# Patient Record
Sex: Female | Born: 1955
Health system: Southern US, Community
[De-identification: ages and names within clinical notes are randomized; demographics above are authoritative.]

## PROBLEM LIST (undated history)

## (undated) DIAGNOSIS — F329 Major depressive disorder, single episode, unspecified: Secondary | ICD-10-CM

## (undated) DIAGNOSIS — G2581 Restless legs syndrome: Secondary | ICD-10-CM

## (undated) DIAGNOSIS — I10 Essential (primary) hypertension: Secondary | ICD-10-CM

## (undated) DIAGNOSIS — I2699 Other pulmonary embolism without acute cor pulmonale: Secondary | ICD-10-CM

## (undated) DIAGNOSIS — I82409 Acute embolism and thrombosis of unspecified deep veins of unspecified lower extremity: Secondary | ICD-10-CM

## (undated) DIAGNOSIS — R42 Dizziness and giddiness: Secondary | ICD-10-CM

## (undated) DIAGNOSIS — I739 Peripheral vascular disease, unspecified: Secondary | ICD-10-CM

## (undated) DIAGNOSIS — D649 Anemia, unspecified: Secondary | ICD-10-CM

## (undated) DIAGNOSIS — G4730 Sleep apnea, unspecified: Secondary | ICD-10-CM

## (undated) DIAGNOSIS — F32A Depression, unspecified: Secondary | ICD-10-CM

## (undated) DIAGNOSIS — T8859XA Other complications of anesthesia, initial encounter: Secondary | ICD-10-CM

## (undated) DIAGNOSIS — M79609 Pain in unspecified limb: Secondary | ICD-10-CM

## (undated) DIAGNOSIS — C189 Malignant neoplasm of colon, unspecified: Secondary | ICD-10-CM

## (undated) DIAGNOSIS — G039 Meningitis, unspecified: Secondary | ICD-10-CM

## (undated) DIAGNOSIS — Z992 Dependence on renal dialysis: Secondary | ICD-10-CM

## (undated) DIAGNOSIS — J449 Chronic obstructive pulmonary disease, unspecified: Secondary | ICD-10-CM

## (undated) DIAGNOSIS — Z9289 Personal history of other medical treatment: Secondary | ICD-10-CM

## (undated) DIAGNOSIS — F419 Anxiety disorder, unspecified: Secondary | ICD-10-CM

## (undated) DIAGNOSIS — N186 End stage renal disease: Secondary | ICD-10-CM

## (undated) DIAGNOSIS — R0602 Shortness of breath: Secondary | ICD-10-CM

## (undated) DIAGNOSIS — I509 Heart failure, unspecified: Secondary | ICD-10-CM

## (undated) DIAGNOSIS — I2510 Atherosclerotic heart disease of native coronary artery without angina pectoris: Secondary | ICD-10-CM

## (undated) DIAGNOSIS — T4145XA Adverse effect of unspecified anesthetic, initial encounter: Secondary | ICD-10-CM

## (undated) HISTORY — DX: Sleep apnea, unspecified: G47.30

## (undated) HISTORY — DX: Malignant neoplasm of colon, unspecified: C18.9

## (undated) HISTORY — DX: Depression, unspecified: F32.A

## (undated) HISTORY — DX: Anxiety disorder, unspecified: F41.9

## (undated) HISTORY — DX: Atherosclerotic heart disease of native coronary artery without angina pectoris: I25.10

## (undated) HISTORY — DX: Dizziness and giddiness: R42

## (undated) HISTORY — DX: Essential (primary) hypertension: I10

## (undated) HISTORY — DX: Chronic obstructive pulmonary disease, unspecified: J44.9

## (undated) HISTORY — PX: ABDOMINAL HYSTERECTOMY: SHX81

## (undated) HISTORY — PX: BREAST BIOPSY: SHX20

## (undated) HISTORY — DX: Meningitis, unspecified: G03.9

## (undated) HISTORY — DX: Major depressive disorder, single episode, unspecified: F32.9

## (undated) HISTORY — DX: Anemia, unspecified: D64.9

## (undated) HISTORY — PX: CHOLECYSTECTOMY: SHX55

---
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
## 2008-08-23 ENCOUNTER — Encounter: Admission: RE | Admit: 2008-08-23 | Discharge: 2008-08-23 | Payer: Self-pay | Admitting: Family Medicine

## 2008-08-23 IMAGING — CR DG KNEE 1-2V*R*
2 series · 2 of 2 positions shown · non-contrast
Comparison: Left knee radiographs done the same date.

CLINICAL DATA: Bilateral knee pain, left greater than right, for 5
months.  No acute injury.

RIGHT KNEE - 1-2 VIEW

[view not recorded (1 of 2)]
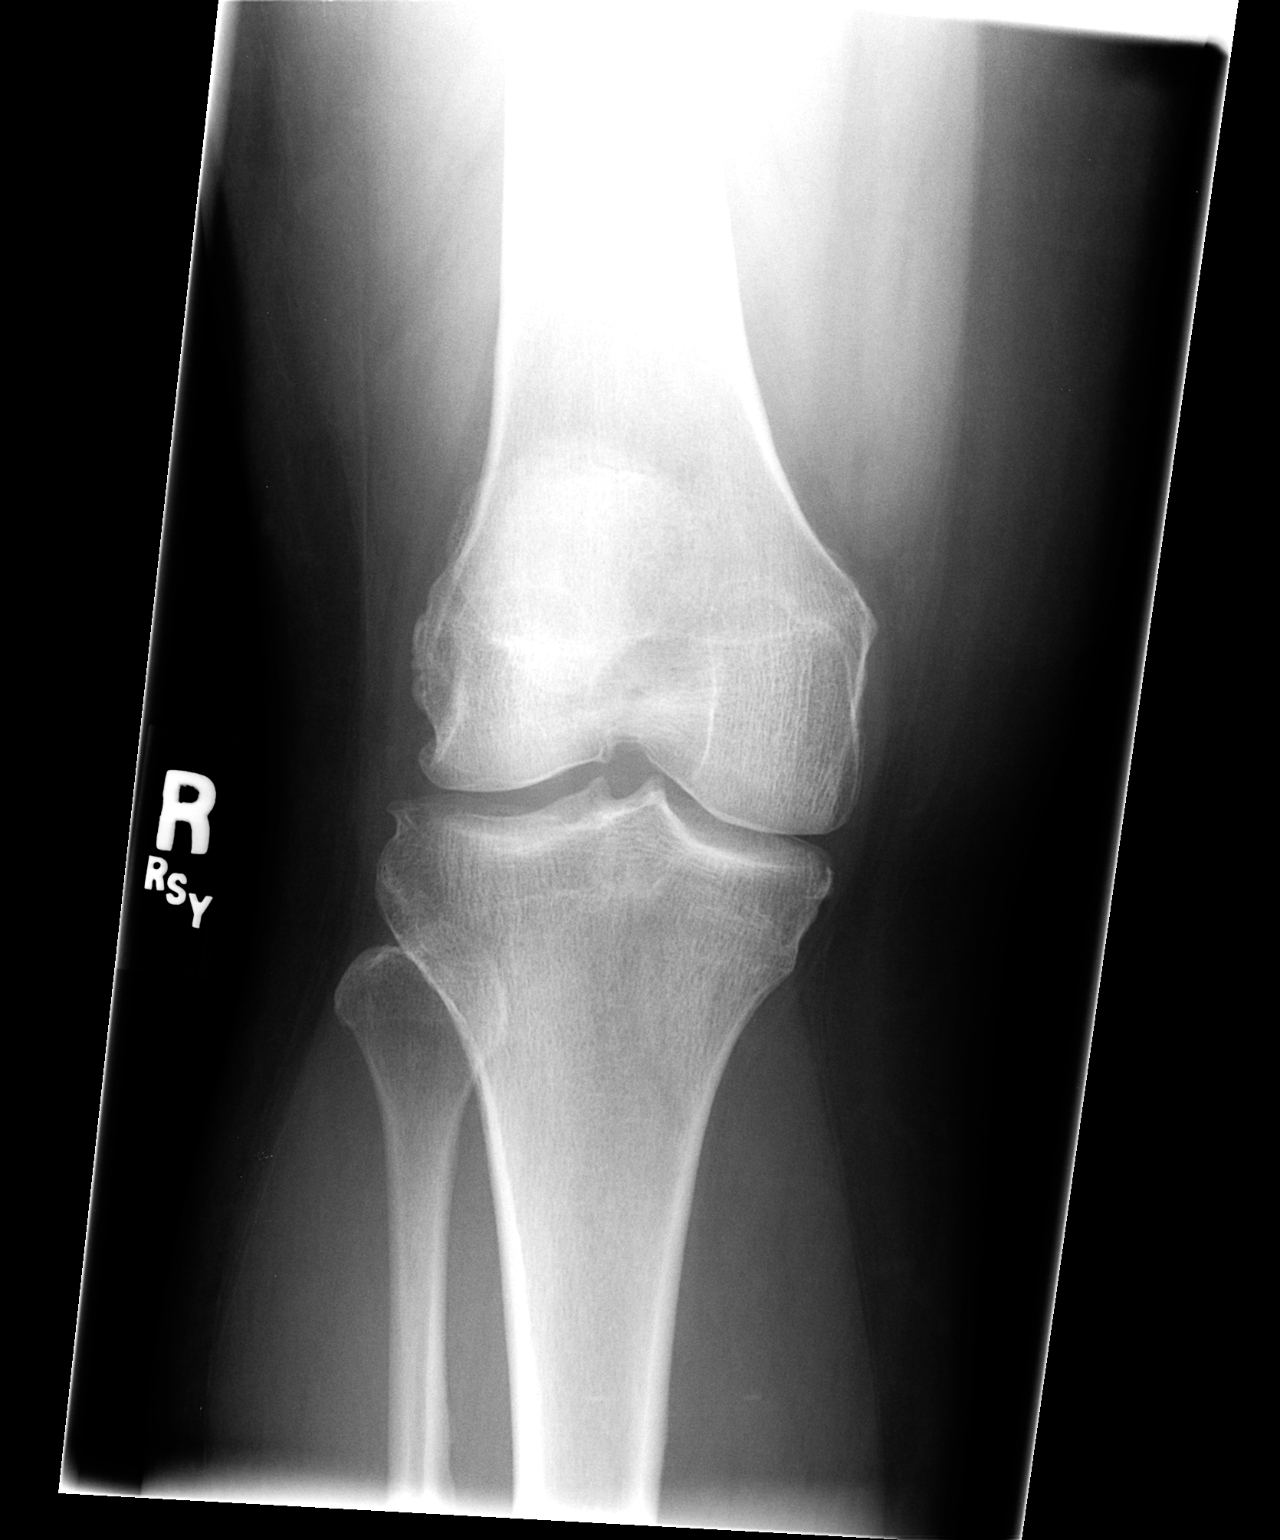

[view not recorded (2 of 2)]
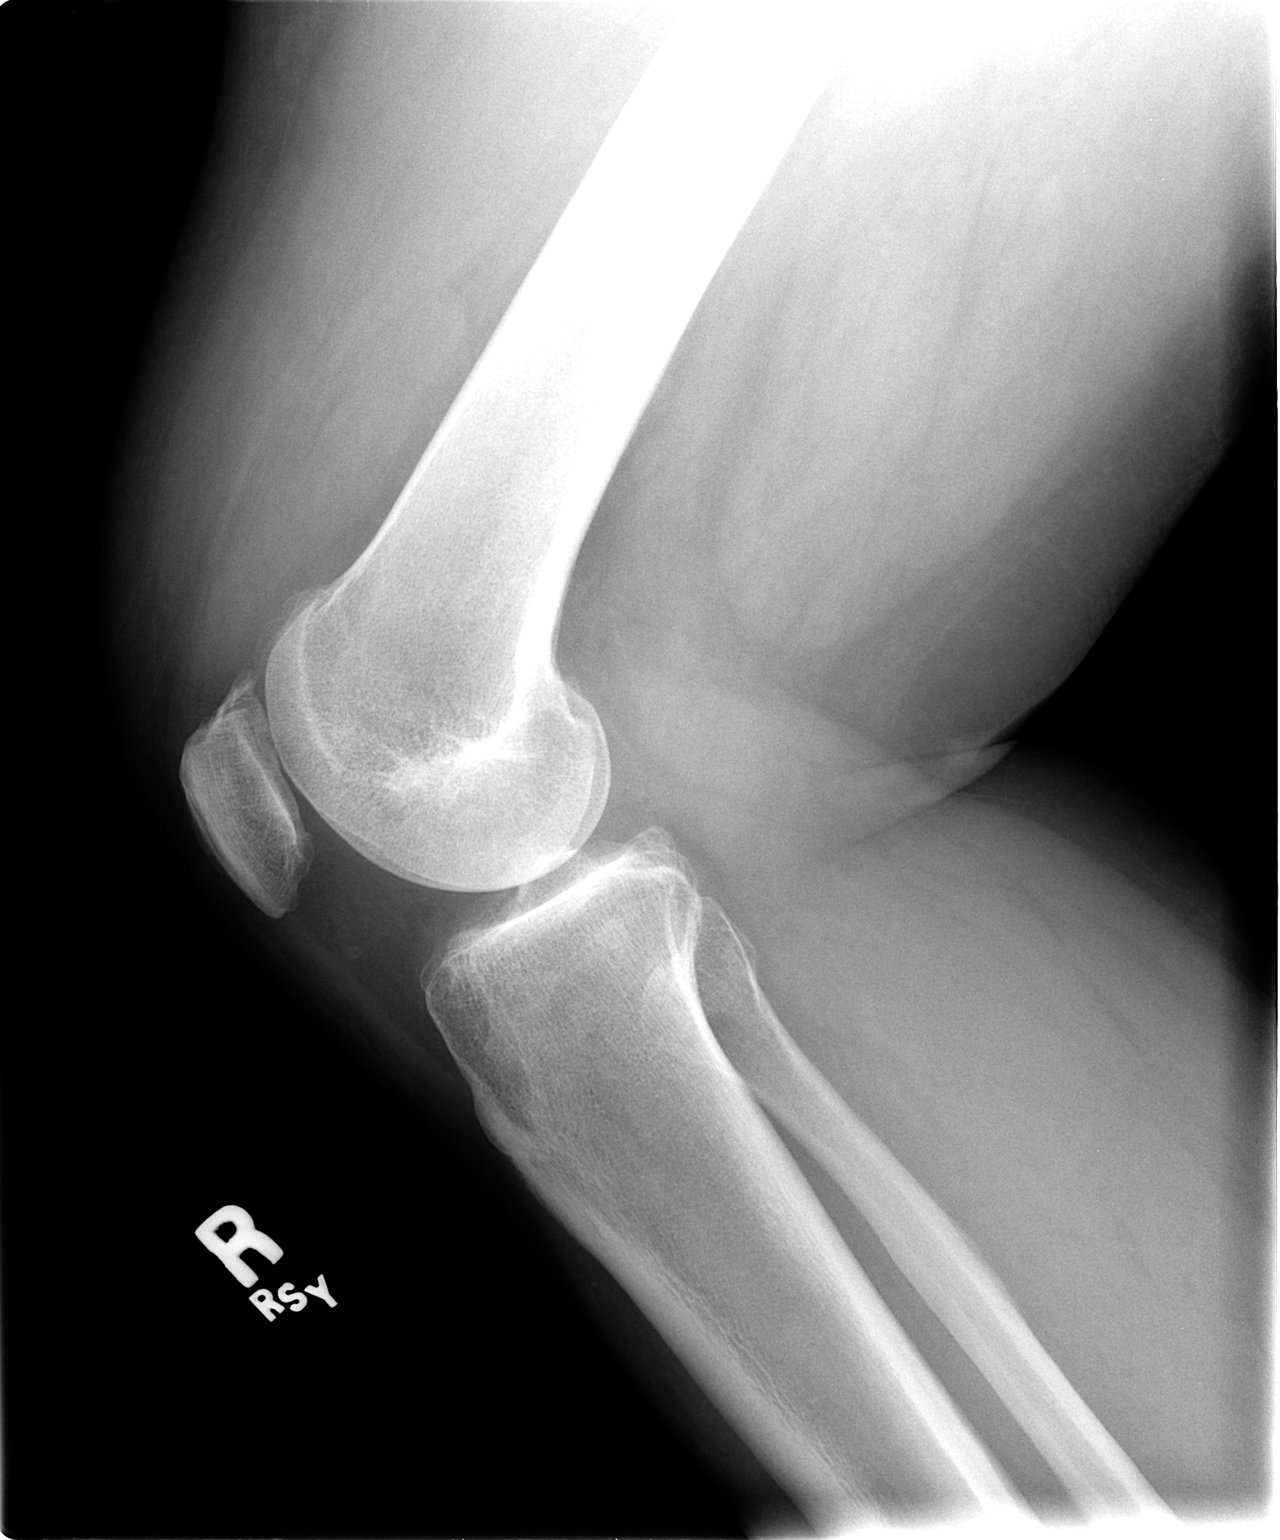

[2 of 2 positions shown; findings below may reference images not displayed]

FINDINGS: Mild tricompartmental degenerative changes are present on
the right.  The alignment and mineralization are normal.  There is
no evidence of acute fracture.  A small knee joint effusion is
present.
IMPRESSION: 1.  Small knee joint effusion.
2.  No acute osseous findings.
3.  Mild tricompartmental degenerative changes.

## 2008-08-23 IMAGING — CR DG KNEE 1-2V*L*
2 series · 2 of 2 positions shown · non-contrast
Comparison: Right knee radiographs done the same date.

CLINICAL DATA: Bilateral knee pain, left greater than right.  No
known injury.

LEFT KNEE - 1-2 VIEW

[view not recorded (1 of 2)]
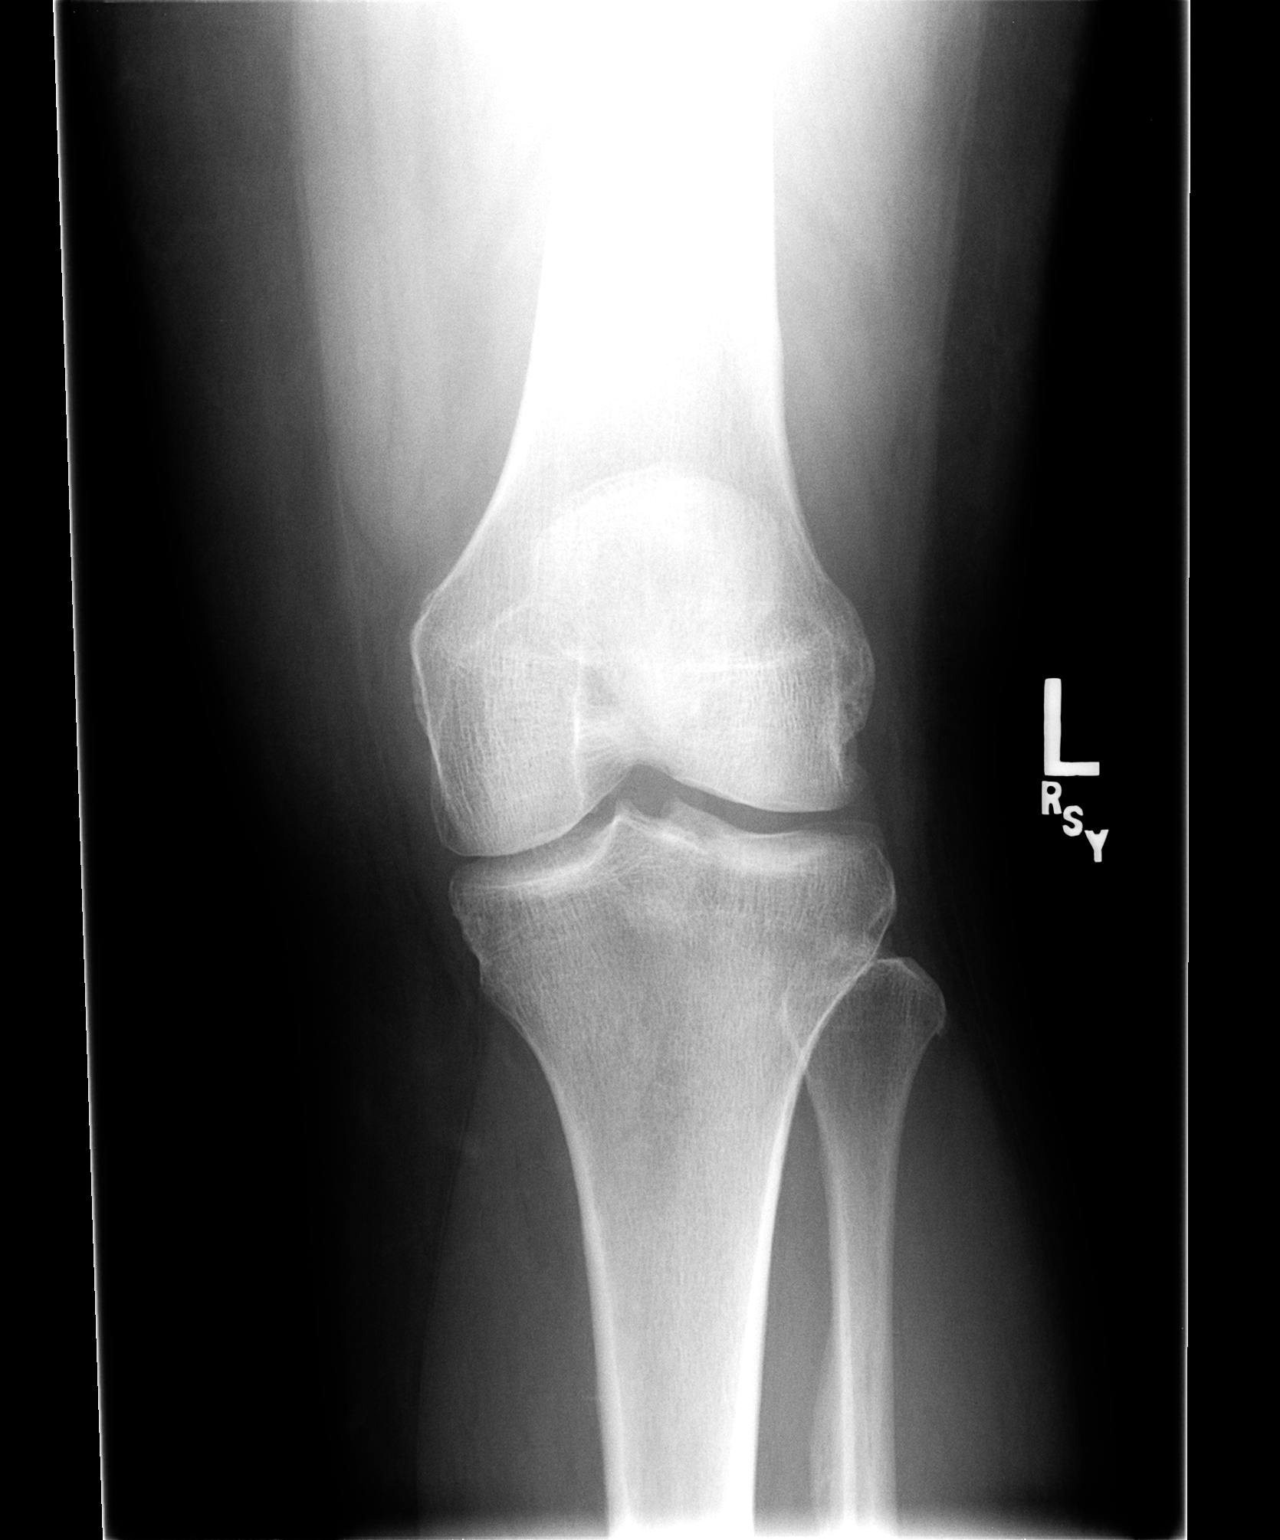

[view not recorded (2 of 2)]
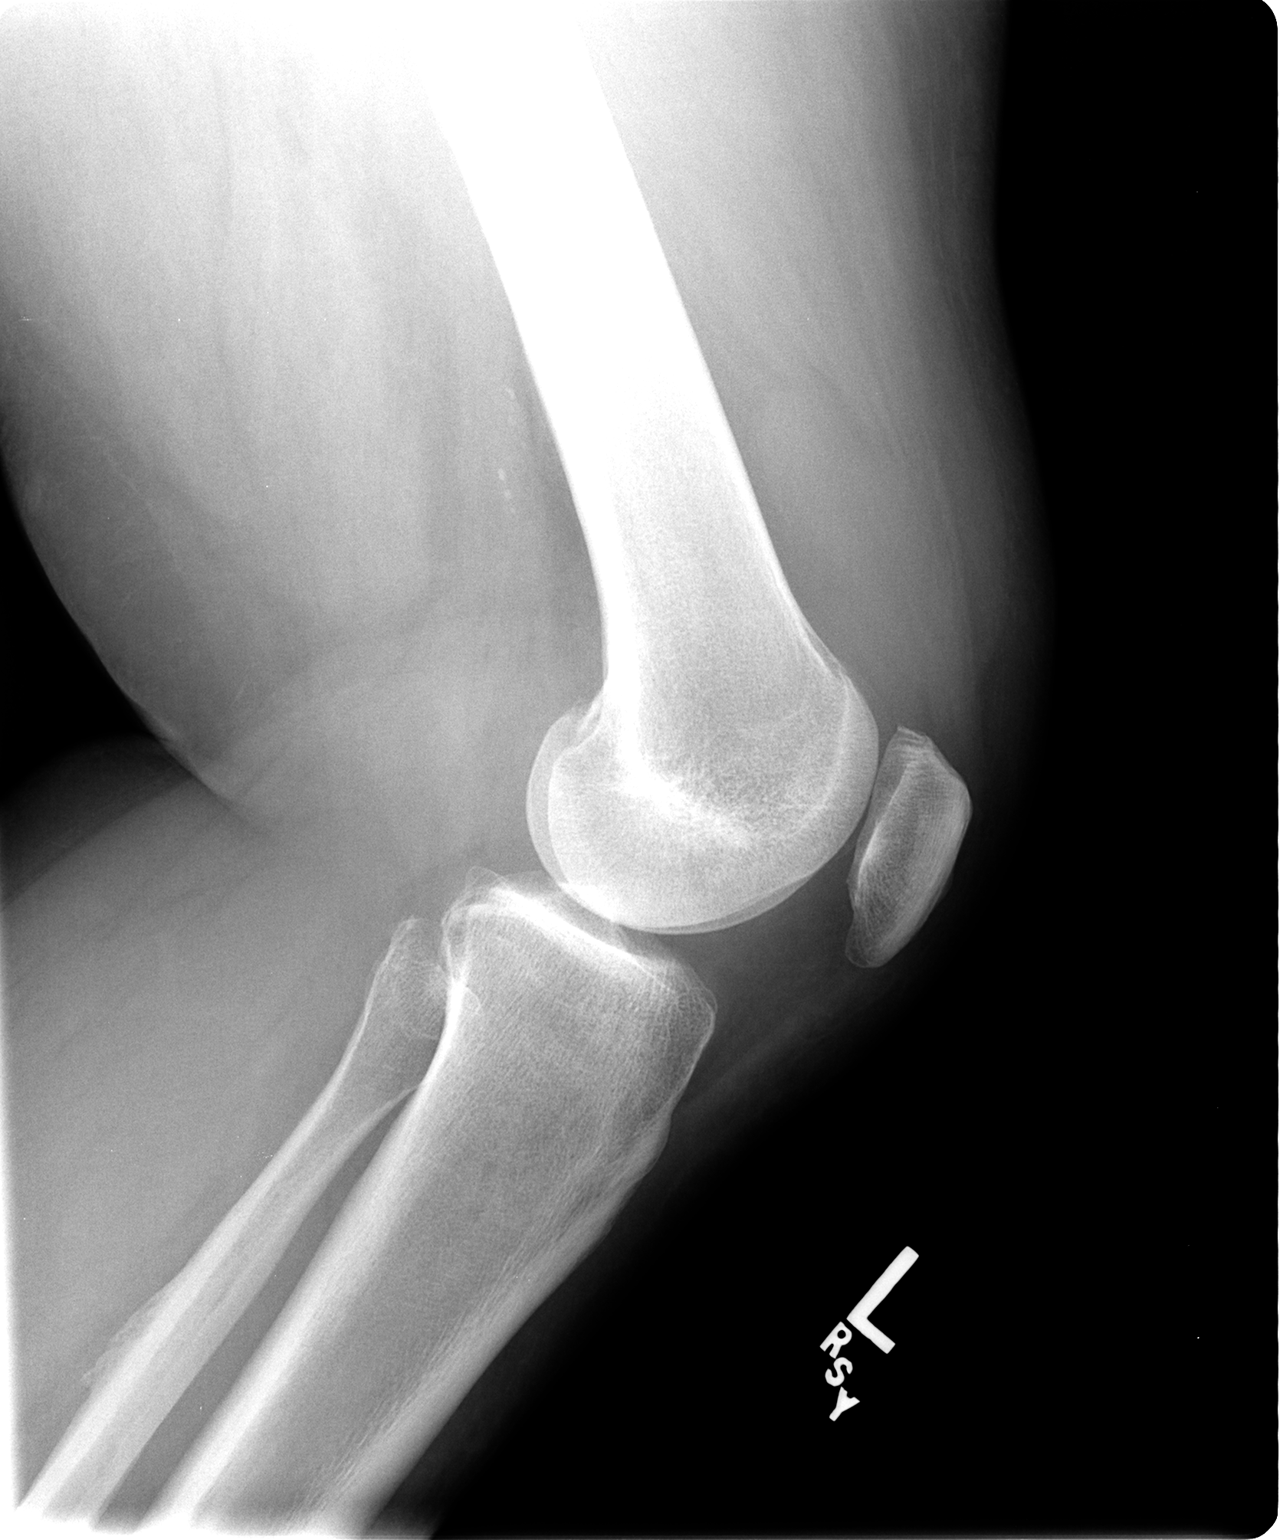

[2 of 2 positions shown; findings below may reference images not displayed]

FINDINGS: Mineralization and alignment appear normal.  There is
minimal medial joint space loss.  No acute fracture or dislocation
is seen.  There is evidence of a moderate sized knee joint
effusion.
IMPRESSION: 1.  Moderate sized knee joint effusion.
2.  No acute osseous findings.  Minimal medial joint space loss.

## 2008-08-31 ENCOUNTER — Encounter: Admission: RE | Admit: 2008-08-31 | Discharge: 2008-08-31 | Payer: Self-pay | Admitting: Family Medicine

## 2008-09-01 ENCOUNTER — Encounter: Admission: RE | Admit: 2008-09-01 | Discharge: 2008-09-01 | Payer: Self-pay | Admitting: Family Medicine

## 2008-09-01 IMAGING — CT CT ABDOMEN W/ CM
2 of 5 series · 16 of 46 positions shown, 18 images · IV contrast (READICAT/WATER & [ID] OMNI 300)
Comparison: None.

CT ABDOMEN

CLINICAL DATA: Left sided abdominal pain.  Previous contrast
allergy for hives with premedication.  Post right partial colectomy
for colon cancer.  Weight loss.  Nausea and diarrhea.
Cholecystectomy and hysterectomy.

CT ABDOMEN AND PELVIS WITH CONTRAST
TECHNIQUE: Multidetector CT imaging of the abdomen and pelvis was
performed using the standard protocol following bolus
administration of intravenous contrast.
Contrast: 125 ml [8O].

[Series 2: a&p w/ · axial · 0.78mm/px · z∈[-416,-41]mm · 13 of 85 slices shown, 15 images]
[im 5/85  soft-tissue]
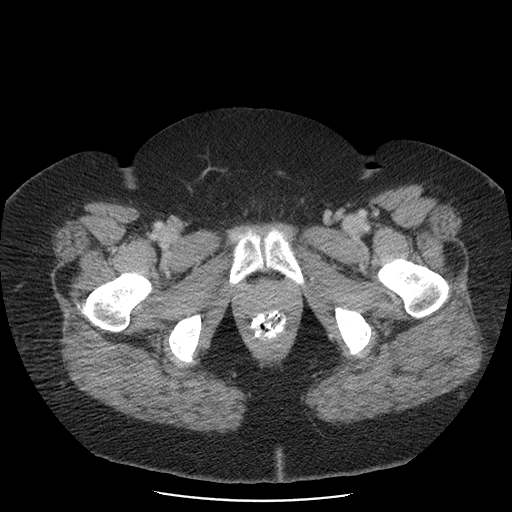
[im 5/85  bone]
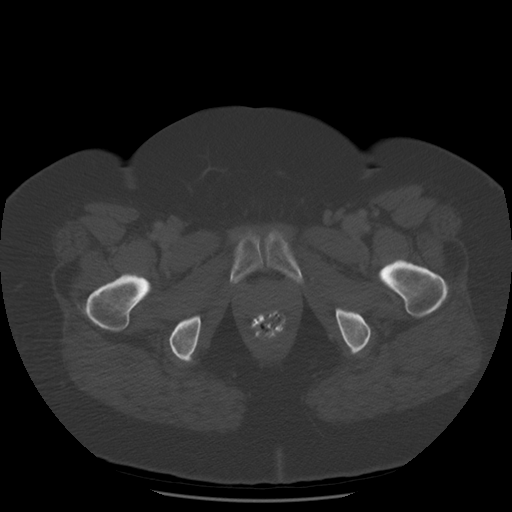
[im 14/85  soft-tissue]
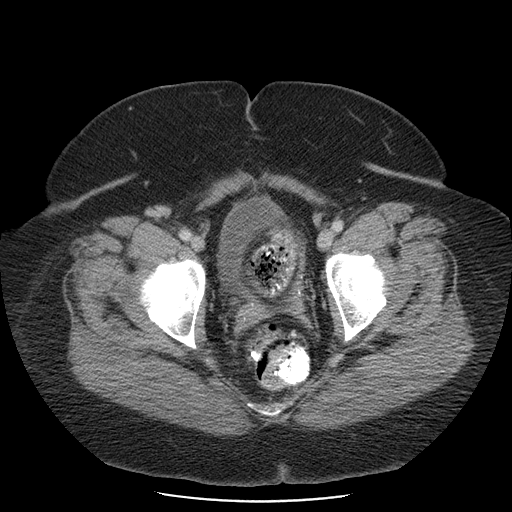
[im 18/85  soft-tissue]
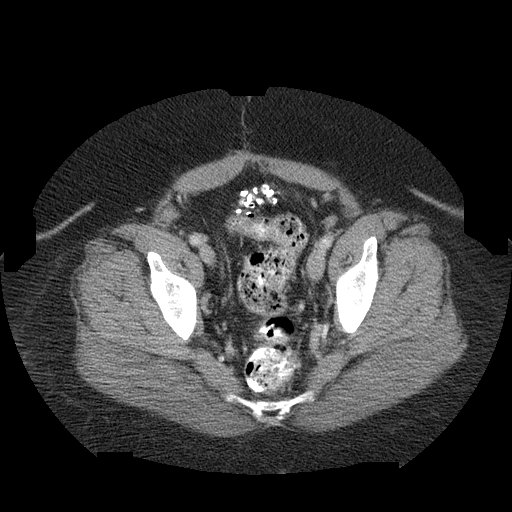
[im 23/85  soft-tissue]
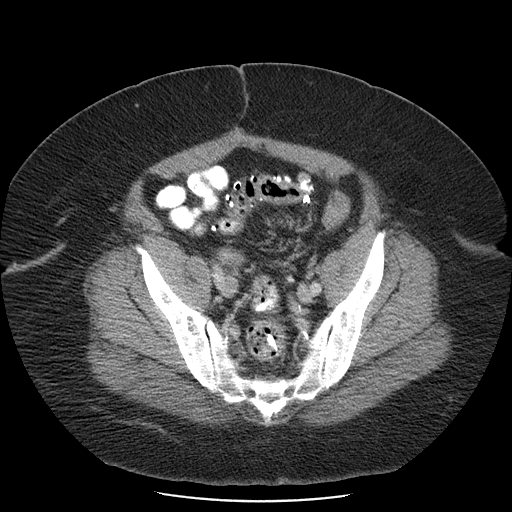
[im 31/85  soft-tissue]
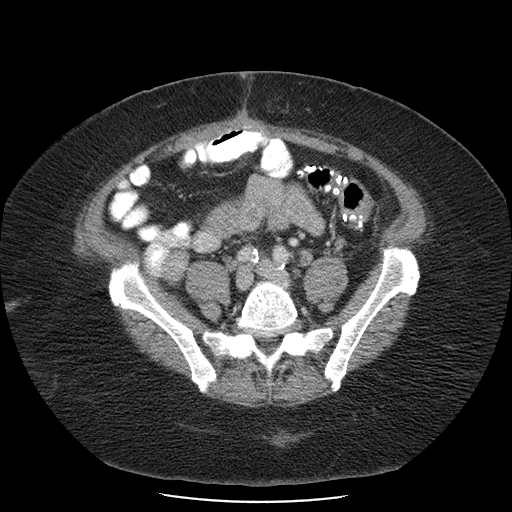
[im 36/85  soft-tissue]
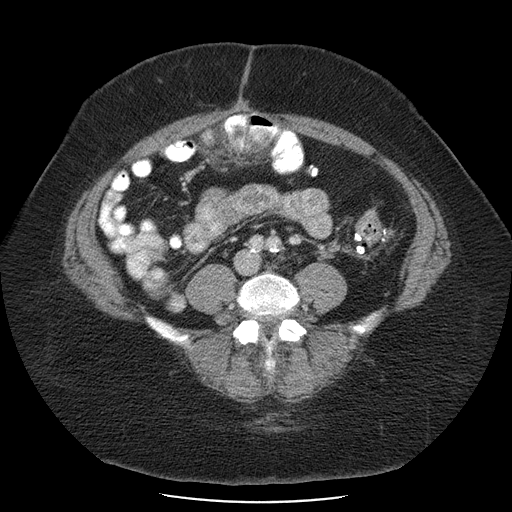
[im 45/85  soft-tissue]
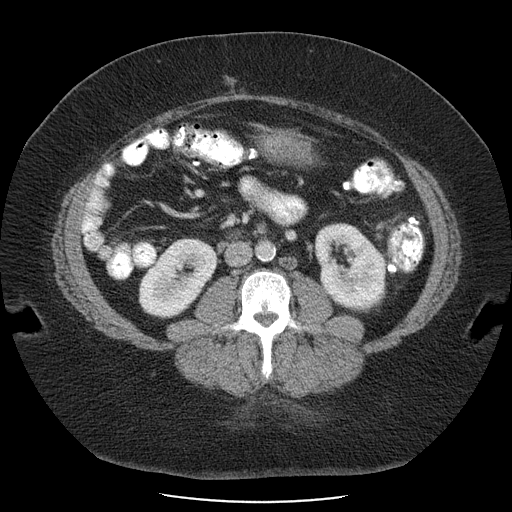
[im 49/85  soft-tissue]
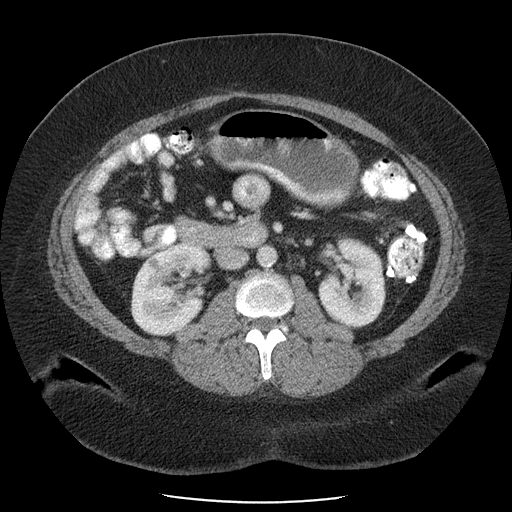
[im 54/85  soft-tissue]
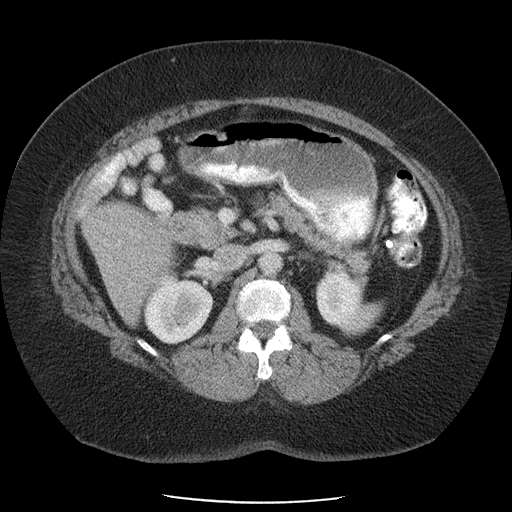
[im 54/85  bone]
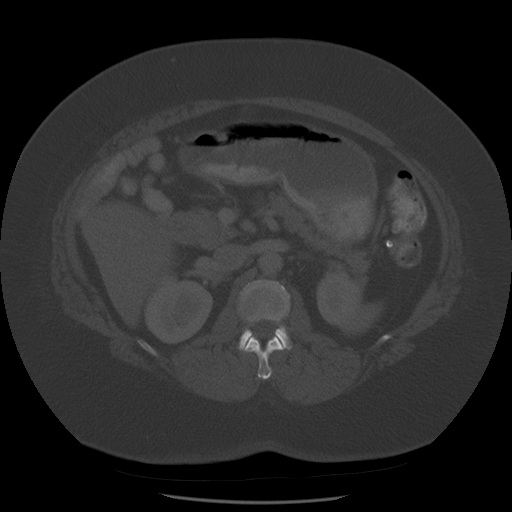
[im 62/85  soft-tissue]
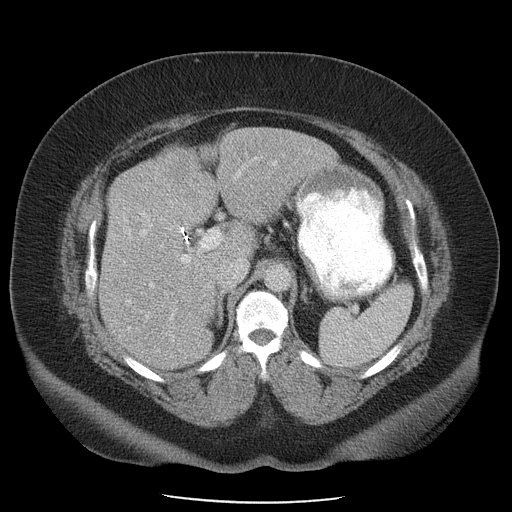
[im 67/85  soft-tissue]
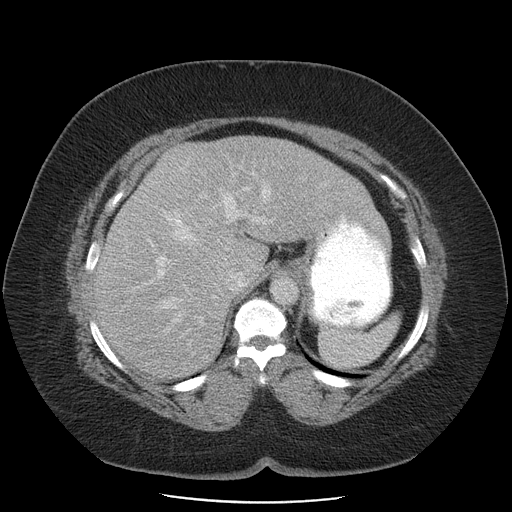
[im 71/85  soft-tissue]
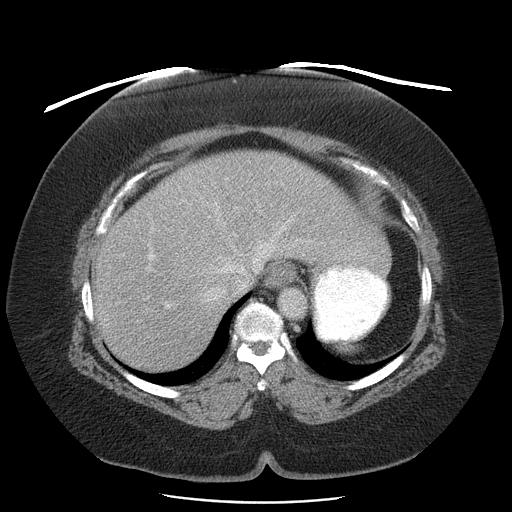
[im 80/85  soft-tissue]
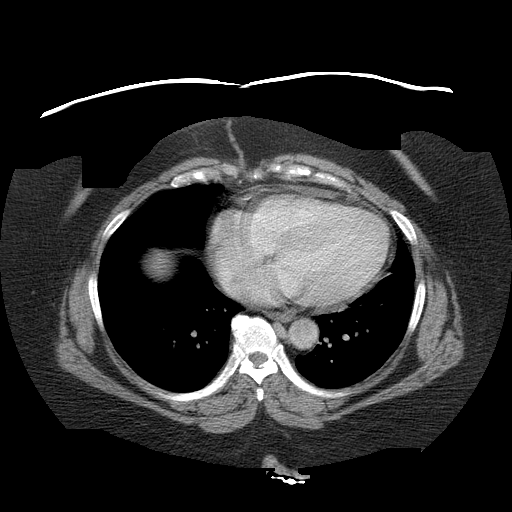

[Series 400: cor abd · coronal · 0.89mm/px · 3 of 113 slices shown]
[im 38/113  soft-tissue]
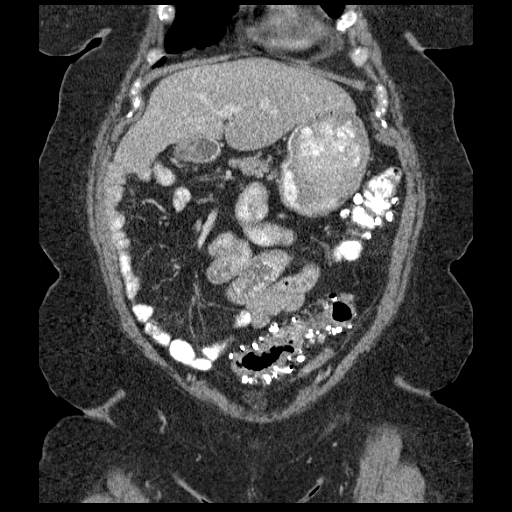
[im 50/113  soft-tissue]
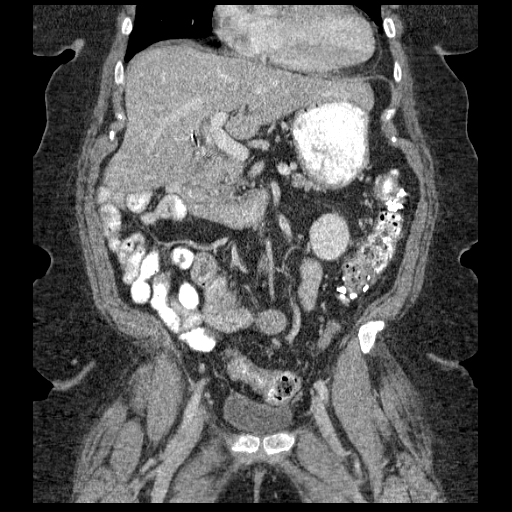
[im 63/113  soft-tissue]
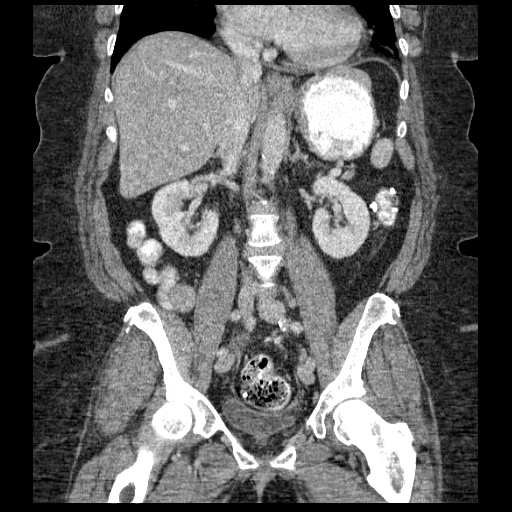

[16 of 46 positions shown; findings below may reference images not displayed]

FINDINGS: Right hemicolectomy and extensive transverse and
descending diverticulosis noted. Slight mesenteric infiltration
along the descending colon is consistent with mild inflammatory
change such as mild diverticulitis.  No free intraperitoneal air or
free fluid noted.  The patient is post cholecystectomy with no
dilated biliary tract.  Liver size is upper limits of normal with
slight diffuse fatty infiltration and no focal lesions to suggest
metastatic disease.  Slight atheromatous vascular calcification
with normal caliber abdominal aorta is seen.  Lung bases are clear
with borderline to slight cardiomegaly.   Jejunal loops measure
upper limits of normal with no evidence for intestinal obstruction.
Small subcentimeter (to mid to lower pole right and solitary lower
pole left) renal cysts incidentally noted. Remaining abdominal
organs appear normal.
IMPRESSION: 1.  Post right hemicolectomy with no evidence for locally recurrent
or metastatic colon cancer.
2.  Extensive transverse and descending diverticulosis with mild
mesenteric edema of probable mild acute diverticulitis the
descending colon.  No associated free intraperitoneal air, free
fluid, or abscess seen.
3.  Post cholecystectomy.
4.  Slight diffuse fatty infiltration liver.
5.  Slight atheromatous vascular calcification with normal caliber
abdominal aorta.
6.  Otherwise no significant abnormality.

CT PELVIS
FINDINGS: The patient is post hysterectomy by history and CT exam.
Extensive sigmoid diverticulosis without inflammation findings
noted.  Remaining intestine is unremarkable.  No free fluid or
significant adenopathy noted.  Moderate atheromatous vascular
calcification visualized. At the superior left pelvis is discrete
solid ovoid structure measuring 4.8 cm long X 1.8 cm AP X 3.5 cm
wide favoring slightly enlarged left ovary.  No discernible right
ovary identified.
IMPRESSION: 1.  Extensive sigmoid diverticulosis without inflammation.
2.  Post hysterectomy.
3.  Moderate atheromatous vascular calcification.
4.  Probable left ovary appears enlarged - need clinical
correlation for menopausal status and possible oophorectomy
history.  Although the patient's body habitus may limit pelvic
ultrasound, recommend pelvic ultrasound confirmation or
alternatively, follow-up abdominal pelvic CT  (3-6 months) may be
confirmatory as clinically indicated.

## 2008-12-30 HISTORY — PX: COLECTOMY: SHX59

## 2009-09-07 ENCOUNTER — Inpatient Hospital Stay (HOSPITAL_COMMUNITY): Admission: EM | Admit: 2009-09-07 | Discharge: 2009-09-09 | Payer: Self-pay | Admitting: Emergency Medicine

## 2009-09-07 IMAGING — US US ABDOMEN COMPLETE
1 series · 14 of 25 positions shown · non-contrast
Comparison: None

CLINICAL DATA: Elevated liver function tests.  Abdominal pain.

ABDOMINAL ULTRASOUND COMPLETE

[Series 1: us abdomen complete · 0.28mm/px · 14 of 72 slices shown]
[im 1/72]
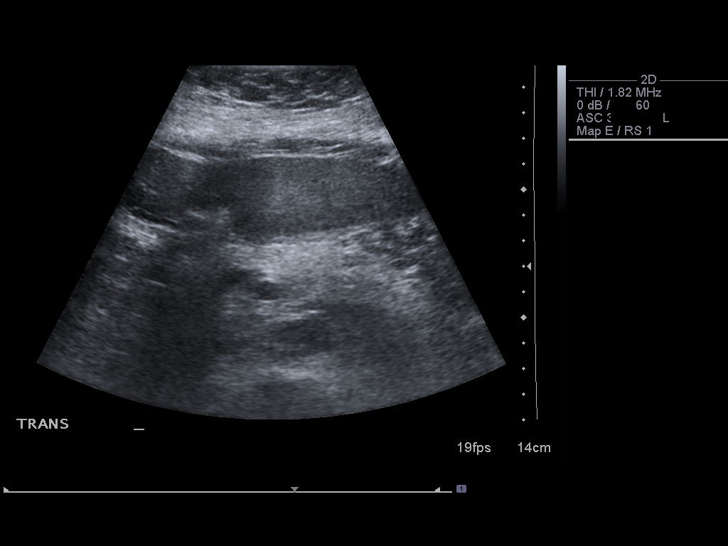
[im 6/72]
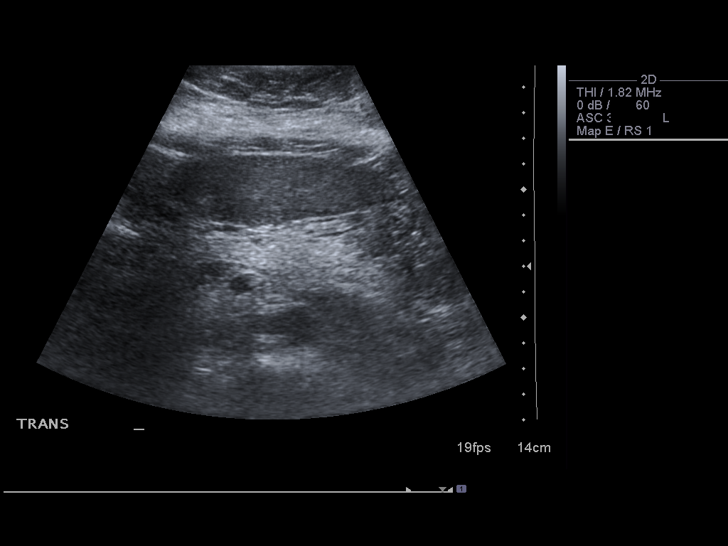
[im 12/72]
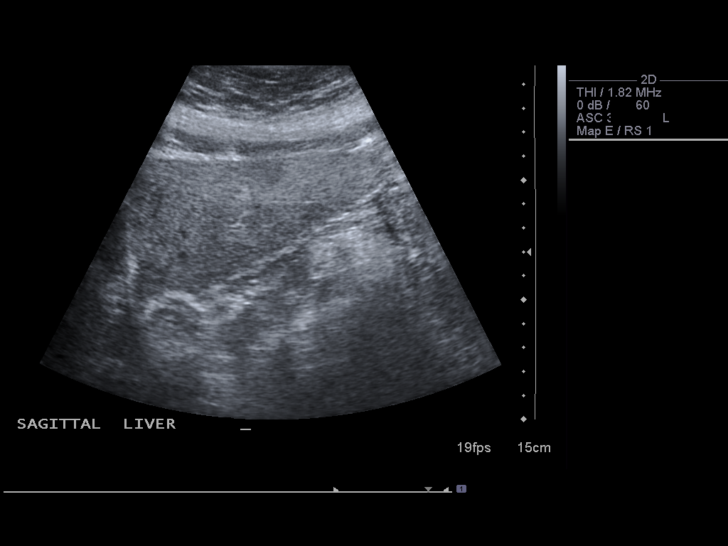
[im 18/72]
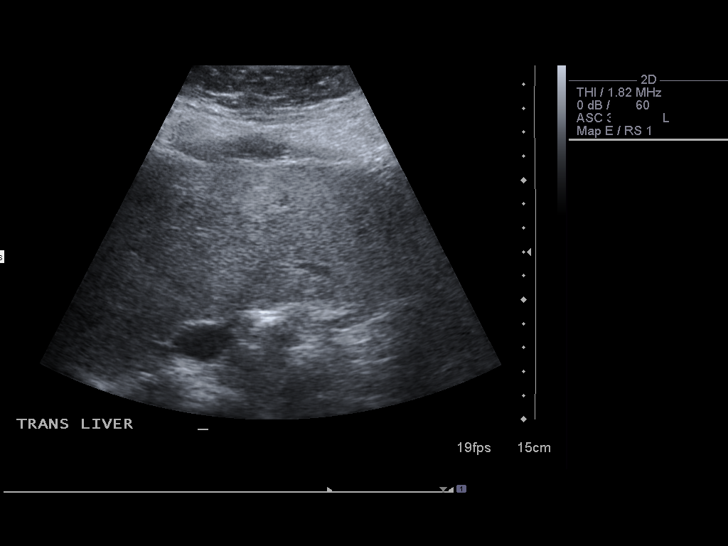
[im 24/72]
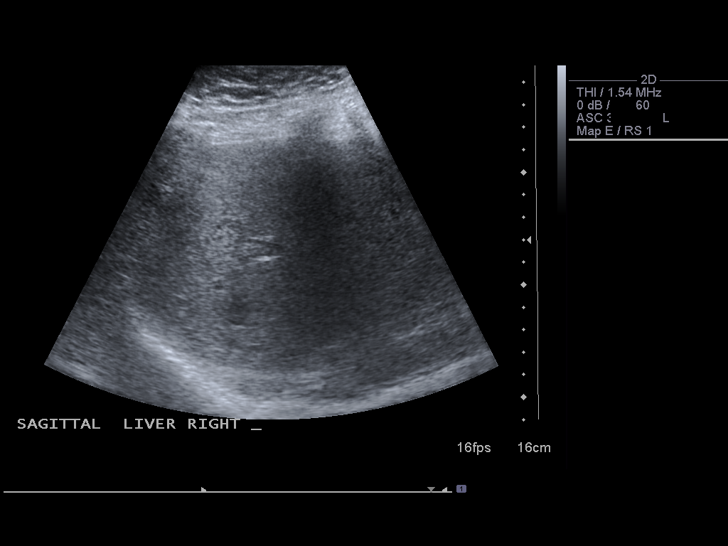
[im 27/72]
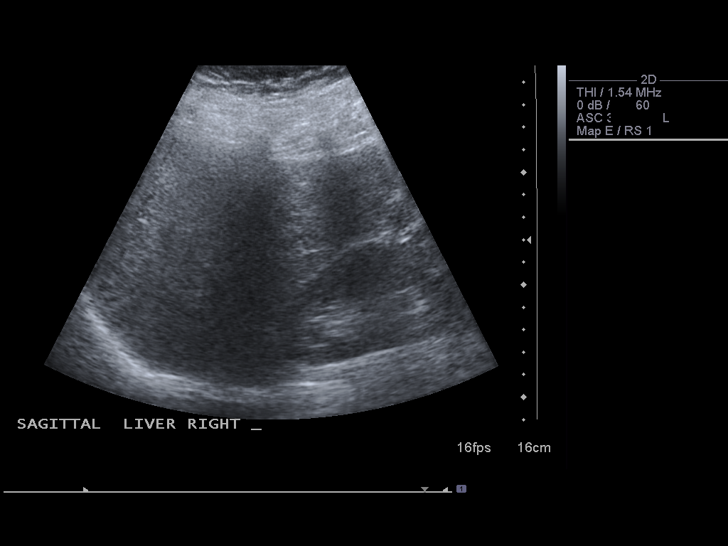
[im 33/72]
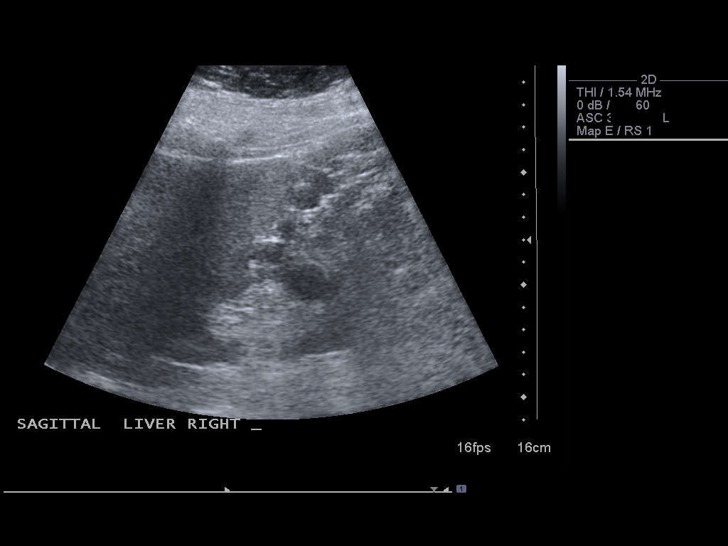
[im 39/72]
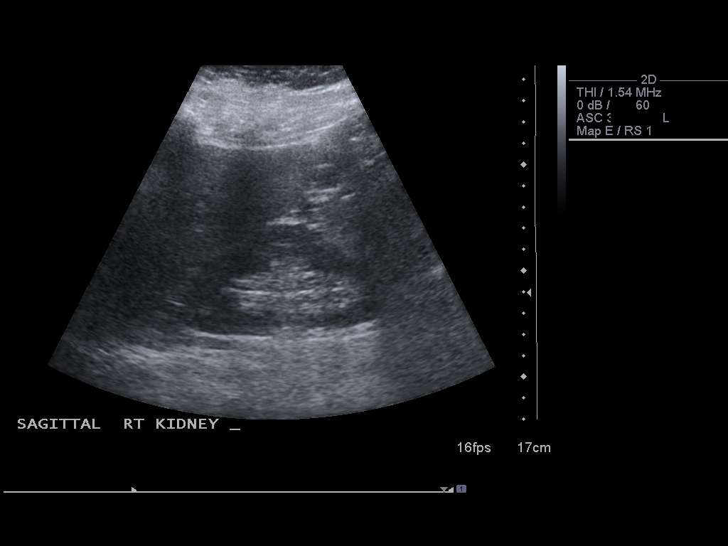
[im 45/72]
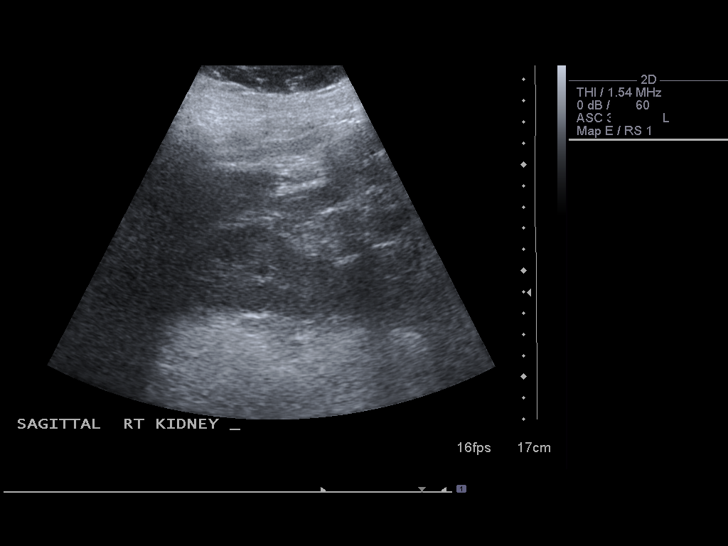
[im 48/72]
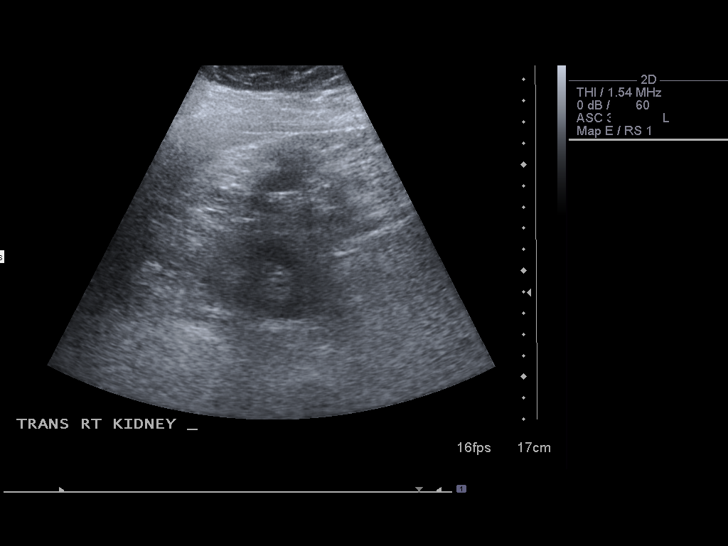
[im 54/72]
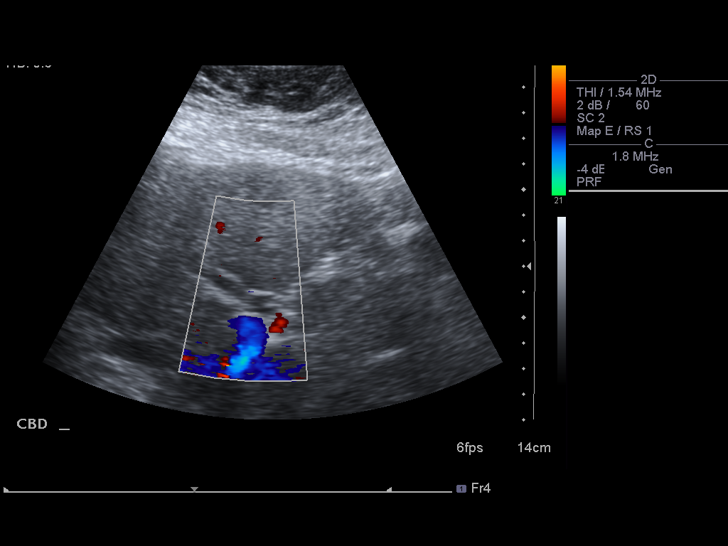
[im 60/72]
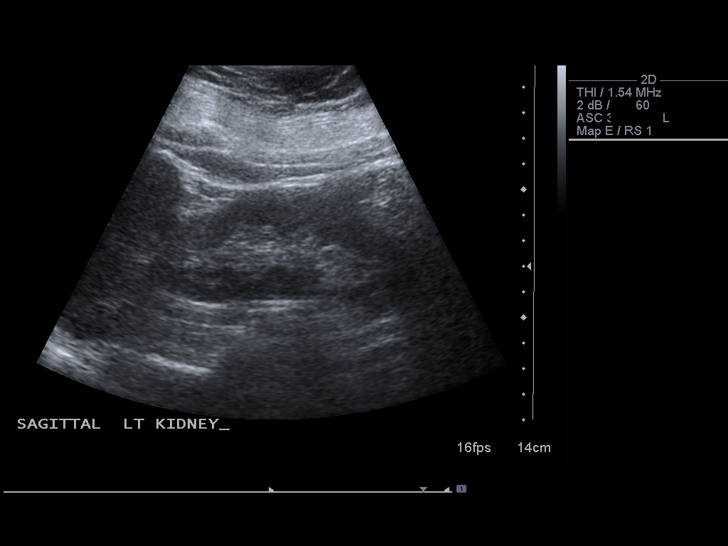
[im 66/72]
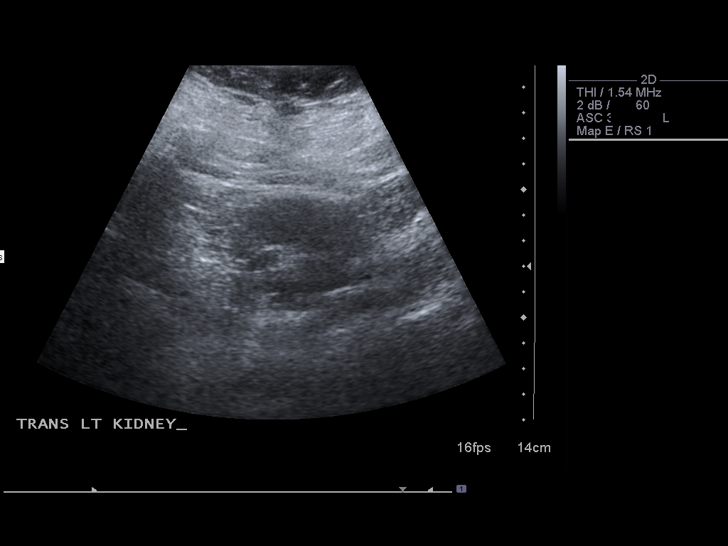
[im 72/72]
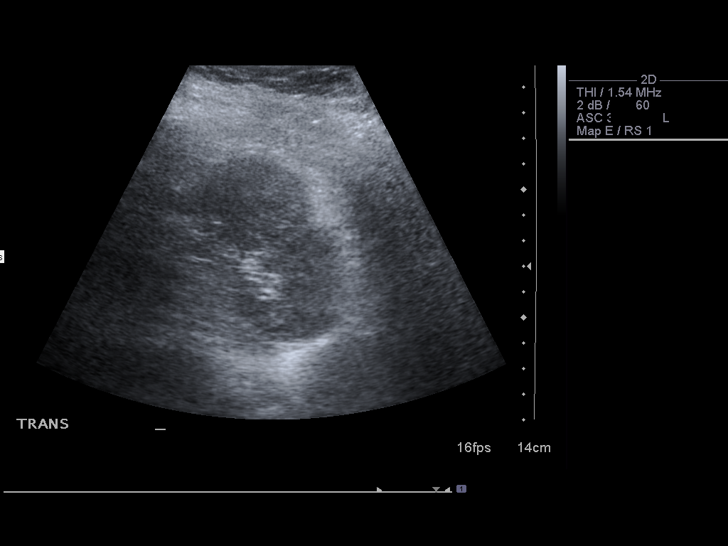

[14 of 25 positions shown; findings below may reference images not displayed]

FINDINGS: Gallbladder:  Surgically absent.

Common Bile Duct:  Within normal limits in caliber.  Measures 6-7
mm in diameter.

Liver:  No focal lesion identified.  Diffusely increased
parenchymal echogenicity, consistent with diffuse fatty
infiltration.

IVC:  Appears normal.

Pancreas:  Although the pancreas is difficult to visualize in its
entirety, no focal pancreatic abnormality is identified.

Spleen:  Within normal limits in size and echotexture.

Right kidney:  Normal in size and parenchymal echogenicity.  No
evidence of mass or hydronephrosis.

Left kidney:  Normal in size and parenchymal echogenicity.  No
evidence of mass or hydronephrosis.

Abdominal Aorta:  No aneurysm identified.
IMPRESSION: 1.  Diffuse fatty infiltration of liver.
2.  Previous cholecystectomy.  No evidence of biliary ductal
dilatation or other acute findings.

## 2009-09-07 IMAGING — CT CT ABDOMEN W/O CM
2 of 4 series · 13 of 32 positions shown, 18 images · non-contrast
Comparison: [DATE]

CT ABDOMEN

CLINICAL DATA: Left flank pain, nausea vomiting.  History of colon
cancer.

CT ABDOMEN AND PELVIS WITHOUT CONTRAST
TECHNIQUE: Multidetector CT imaging of the abdomen and pelvis was
performed following the standard protocol without intravenous
contrast.

[Series 2: abd pelvis · axial · 0.93mm/px · z∈[-380,-60]mm · 7 of 86 slices shown, 12 images]
[im 11/86  soft-tissue]
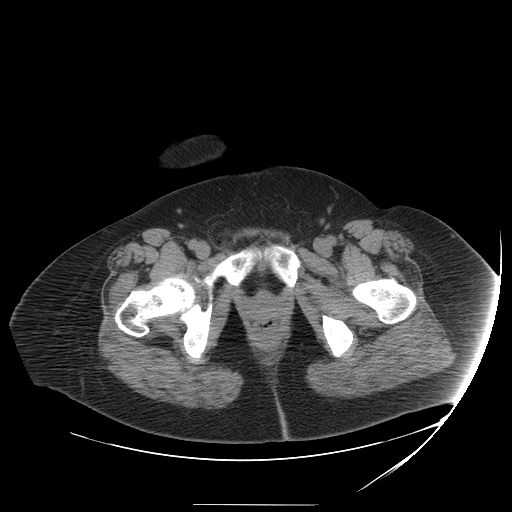
[im 11/86  bone]
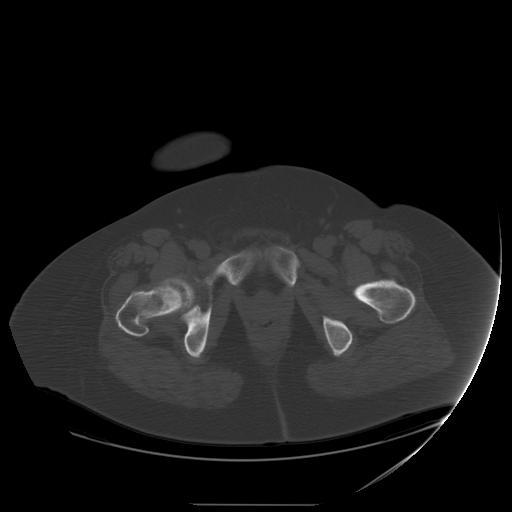
[im 22/86  soft-tissue]
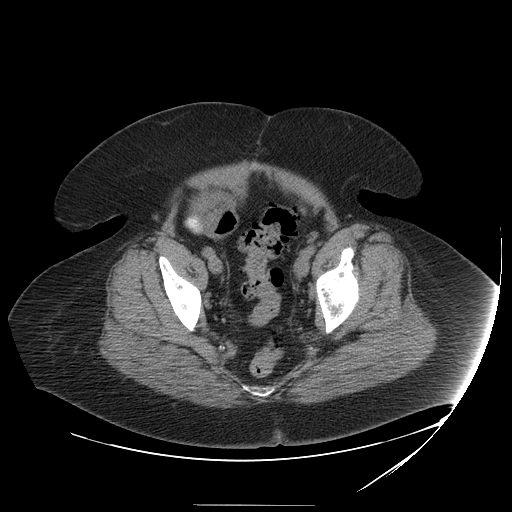
[im 32/86  soft-tissue]
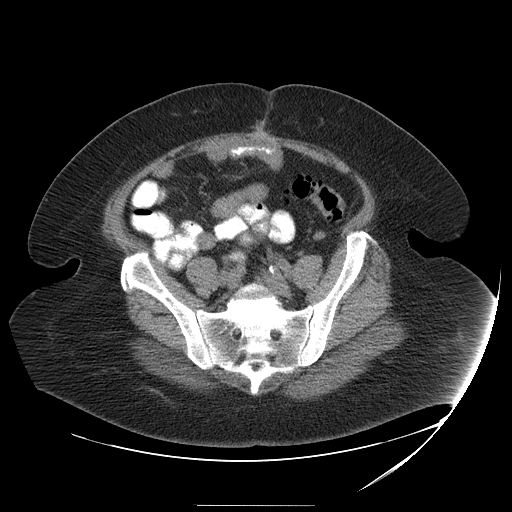
[im 43/86  soft-tissue]
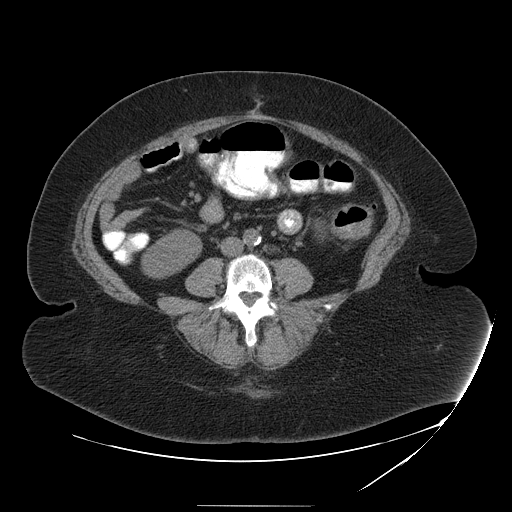
[im 43/86  lung]
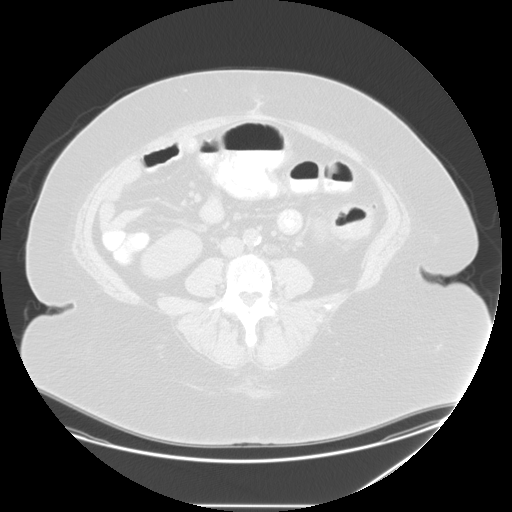
[im 54/86  soft-tissue]
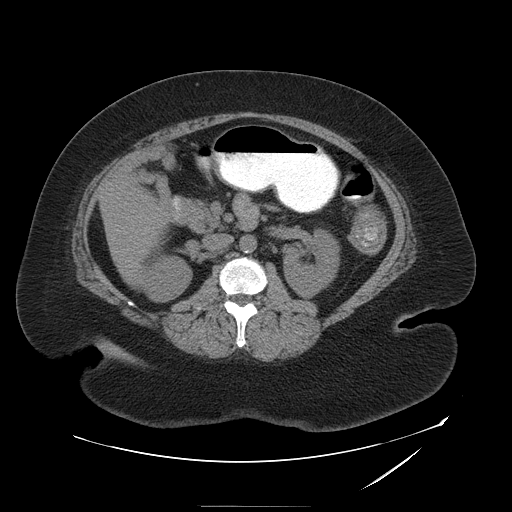
[im 54/86  lung]
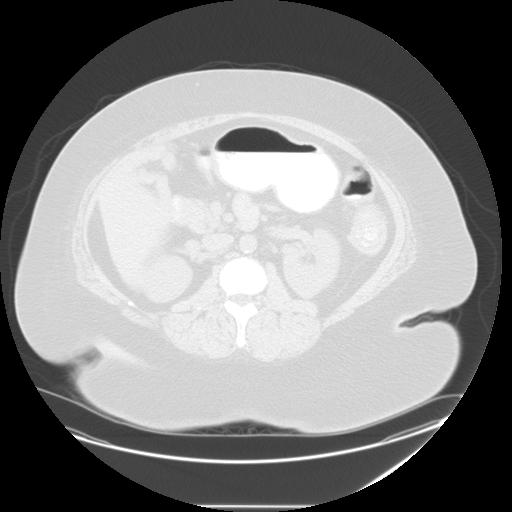
[im 64/86  soft-tissue]
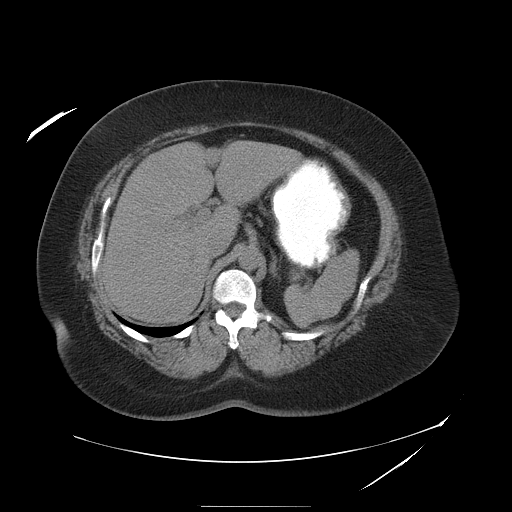
[im 64/86  lung]
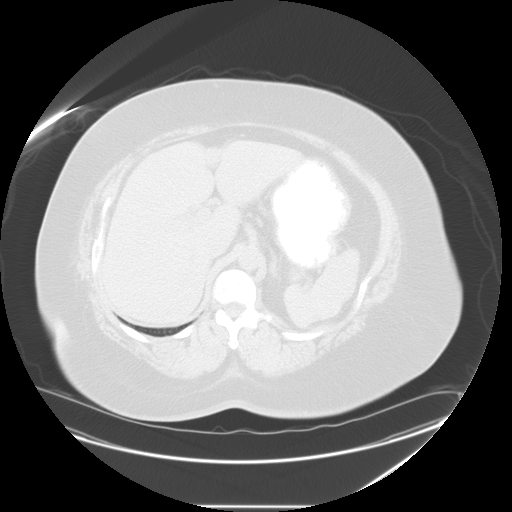
[im 75/86  soft-tissue]
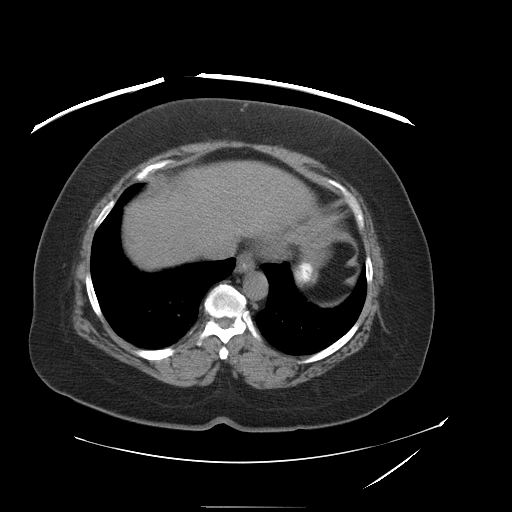
[im 75/86  lung]
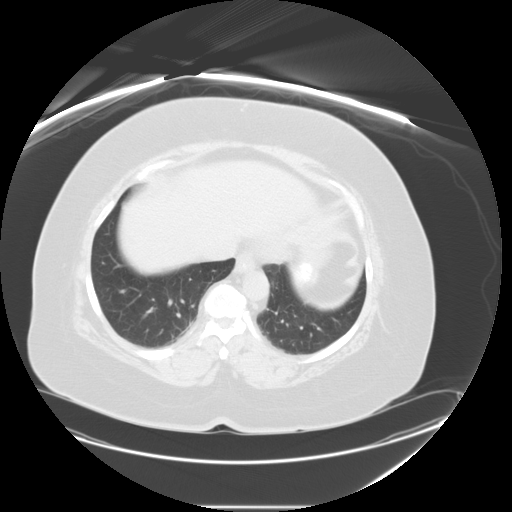

[Series 400: reformatted · sagittal · 0.93mm/px · 6 of 114 slices shown]
[im 11/114  soft-tissue]
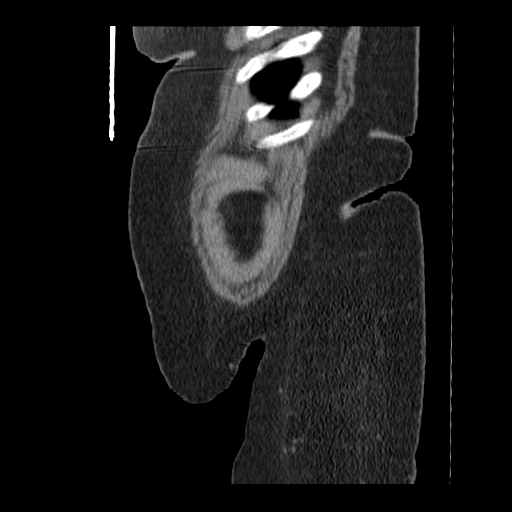
[im 21/114  soft-tissue]
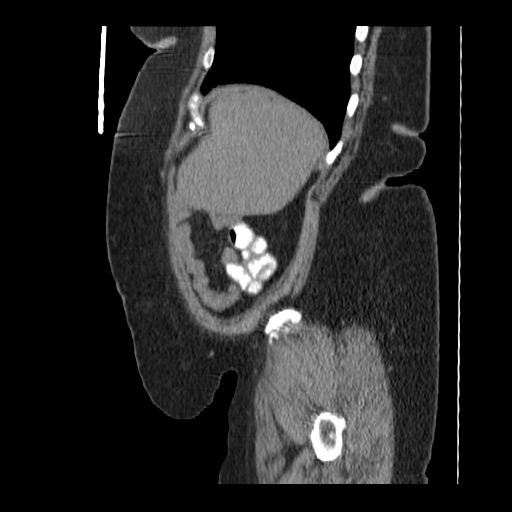
[im 42/114  soft-tissue]
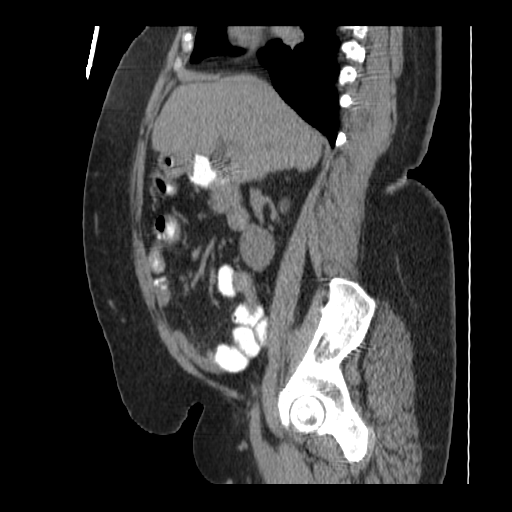
[im 52/114  soft-tissue]
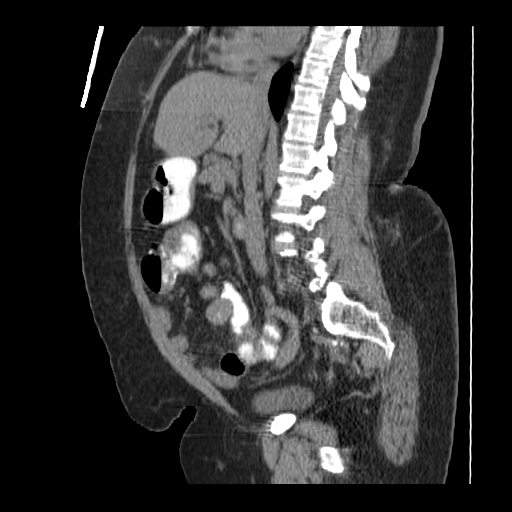
[im 62/114  soft-tissue]
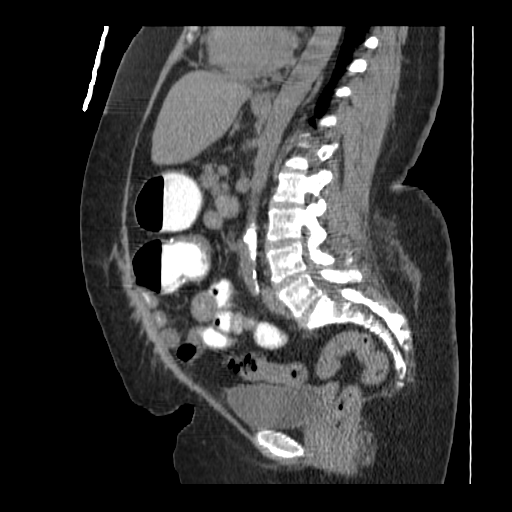
[im 72/114  soft-tissue]
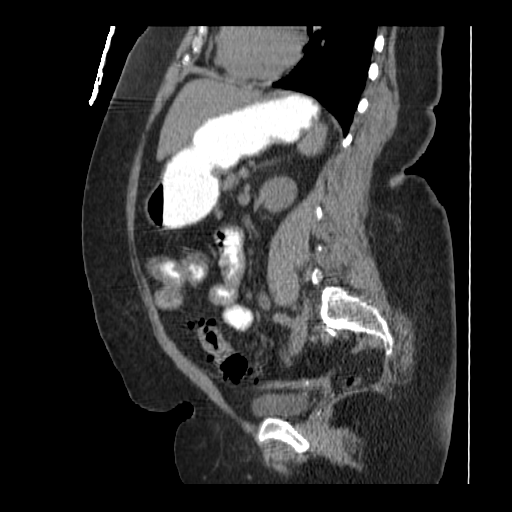

[13 of 32 positions shown; findings below may reference images not displayed]

FINDINGS: The lung bases are clear.  Minimal dependent
atelectasis.

The unenhanced appearance of the liver is unremarkable.  The the
patient has had a cholecystectomy.  No biliary dilatation.  The
spleen is normal in size.  The pancreas is unremarkable.  The
adrenal glands and kidneys are normal.  No renal calculi or
hydronephrosis.

The stomach and duodenum are unremarkable.  There is diverticulosis
involving the descending colon with mild changes of diverticulitis.
No complicating features are seen.  The the patient has had a right
hemicolectomy.  There is fairly marked wall thickening and
inflammation involving the distal/neoterminal ileum.  This may be
inflammatory or infectious enteritis but Crohn's disease is
certainly a consideration.  No findings for obstruction.

There are small scattered mesenteric and retroperitoneal lymph
nodes but no adenopathy.  The aorta is normal in caliber.
Atherosclerotic changes are noted.
IMPRESSION: 1.  Mild / uncomplicated diverticulitis involving the descending
colon.
2.  Marked wall thickening and inflammatory change involving the
distal/neoterminal ileum.  Crohn's disease would be a possibility.
Other inflammatory or infectious enteritis is possible also.
3.  Status post cholecystectomy.  No biliary dilatation.

CT PELVIS
FINDINGS: The rectum and sigmoid colon are unremarkable except for
severe diverticulosis of the sigmoid colon.  No pelvic mass,
adenopathy or free pelvic fluid collection.  The bladder appears
normal.  The patient has had a hysterectomy.

No inguinal mass or hernia.  The bony pelvis is intact.  Mild SI
joint degenerative changes on the right side.
IMPRESSION: 1.  No acute pelvic findings, mass lesions or adenopathy.
2.  Status post hysterectomy.

## 2009-12-06 ENCOUNTER — Inpatient Hospital Stay (HOSPITAL_COMMUNITY): Admission: AD | Admit: 2009-12-06 | Discharge: 2009-12-14 | Payer: Self-pay | Admitting: Gastroenterology

## 2009-12-06 IMAGING — RF DG BE W/ CM - WO/W KUB
15 of 24 series · 15 of 24 positions shown · IV contrast (agent unspecified)
Comparison: None.

CLINICAL DATA: History of colon cancer with right hemicolectomy.
Gastroenterologist unable to pass colonoscope beyond 40 cm.

SINGLE CONTRAST BARIUM ENEMA
Fluoroscopy Time: 3.4 minutes.
Contrast: Regular barium.

[Series 1: run · 1 of 1 slices shown (1 of 12)]
[im 1/1]
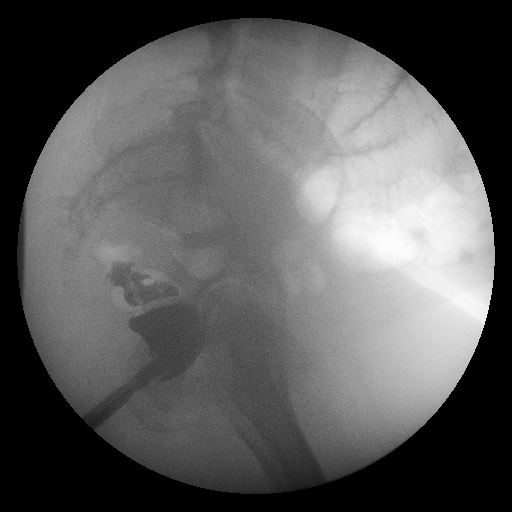

[Series 3: run · 1 of 1 slices shown (2 of 12)]
[im 1/1]
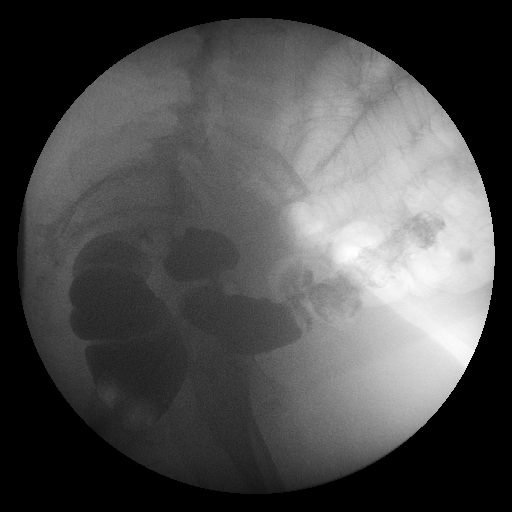

[Series 5: run · 1 of 1 slices shown (3 of 12)]
[im 1/1]
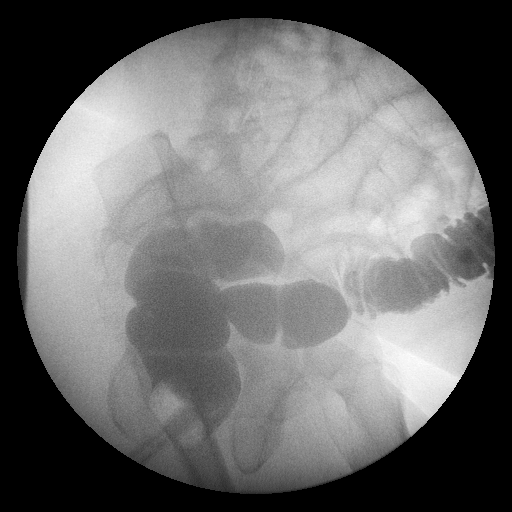

[Series 6: run · 1 of 1 slices shown (4 of 12)]
[im 1/1]
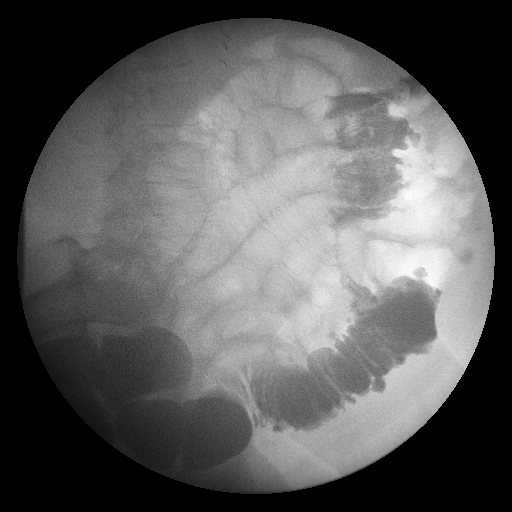

[Series 8: run · 1 of 1 slices shown (5 of 12)]
[im 1/1]
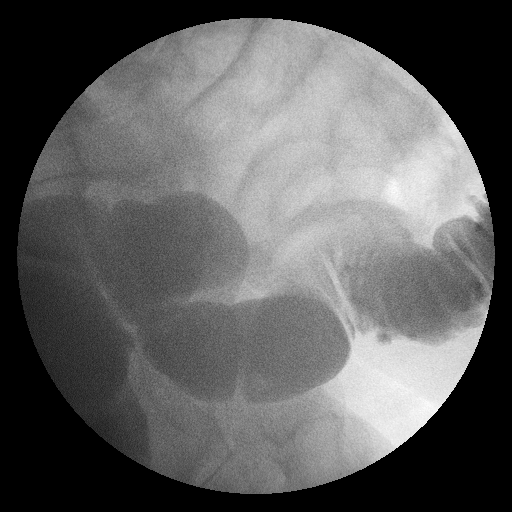

[Series 9: run · 1 of 1 slices shown (6 of 12)]
[im 1/1]
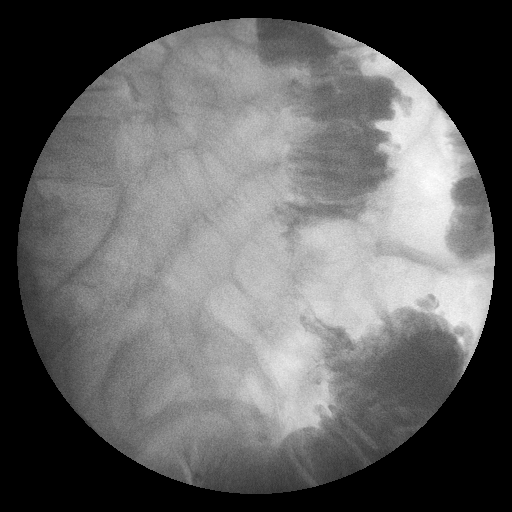

[Series 11: run · 1 of 1 slices shown (7 of 12)]
[im 1/1]
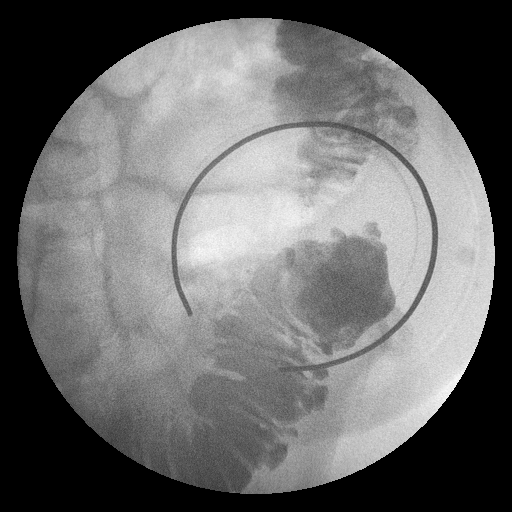

[Series 13: run · 1 of 1 slices shown (8 of 12)]
[im 1/1]
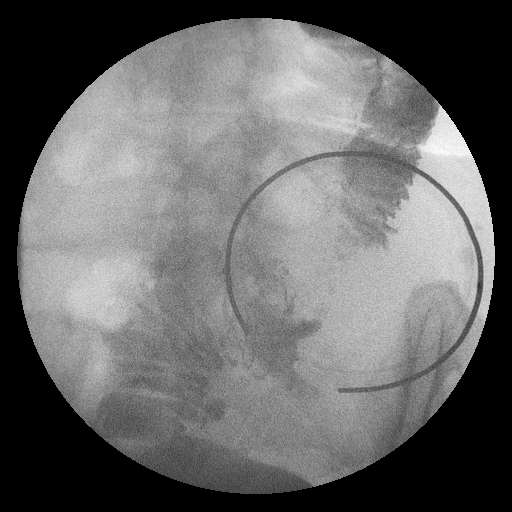

[Series 14: run · 1 of 1 slices shown (9 of 12)]
[im 1/1]
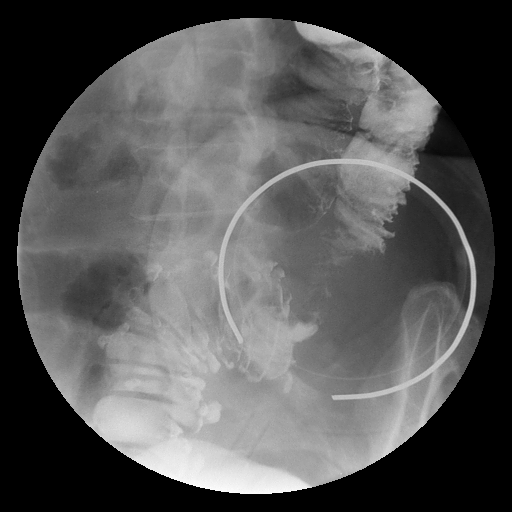

[Series 16: run · 1 of 1 slices shown (10 of 12)]
[im 1/1]
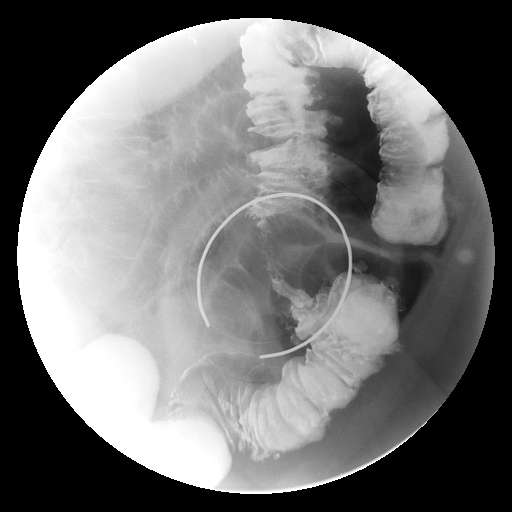

[Series 17: run · 1 of 1 slices shown (11 of 12)]
[im 1/1]
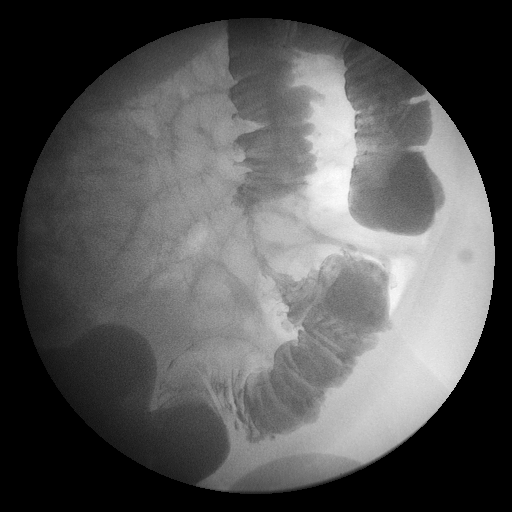

[Series 19: run · 1 of 1 slices shown (12 of 12)]
[im 1/1]
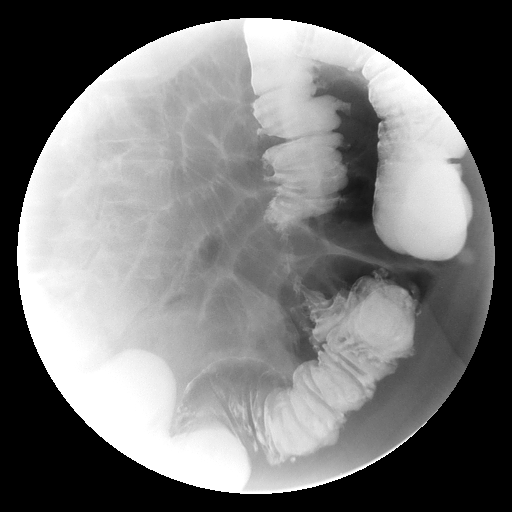

[view not recorded · 0.20mm/px · 1 of 1 slices shown (1 of 3)]
[im 1/1]
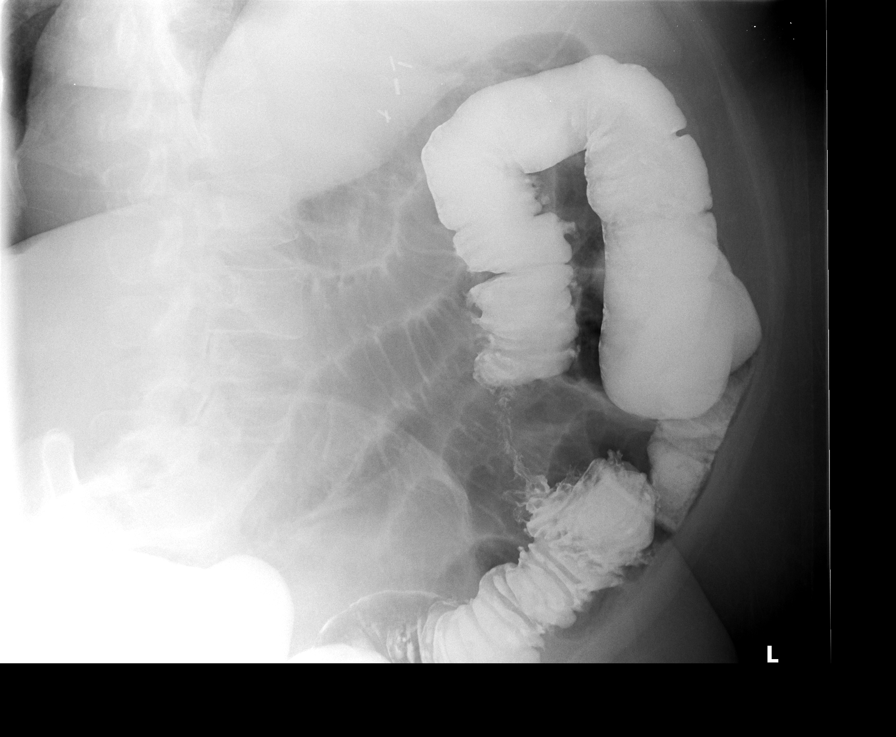

[view not recorded · 0.20mm/px · 1 of 1 slices shown (2 of 3)]
[im 1/1]
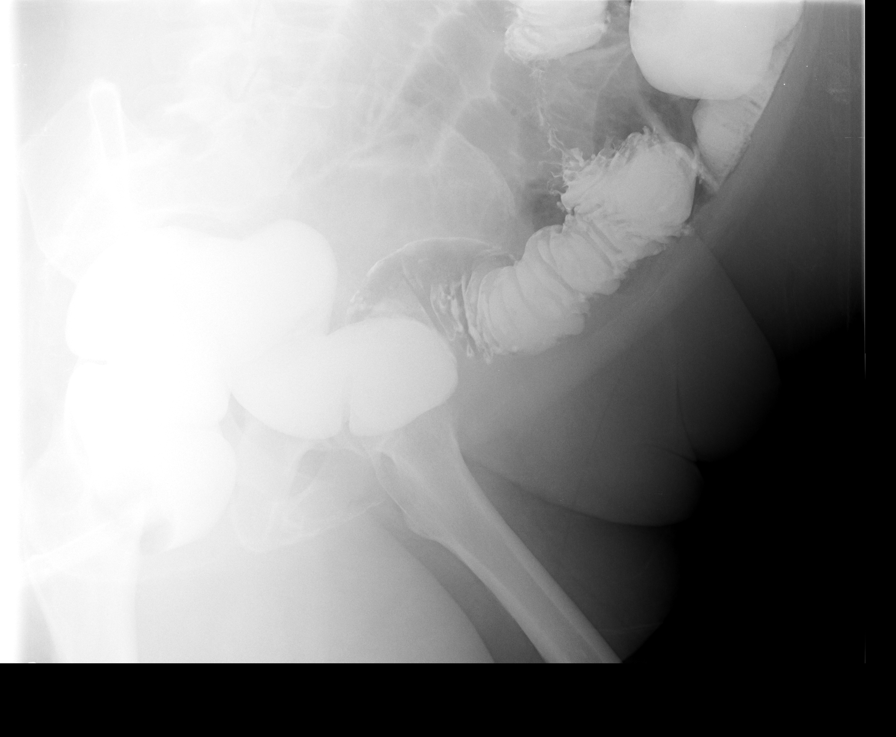

[view not recorded · 0.20mm/px · 1 of 1 slices shown (3 of 3)]
[im 1/1]
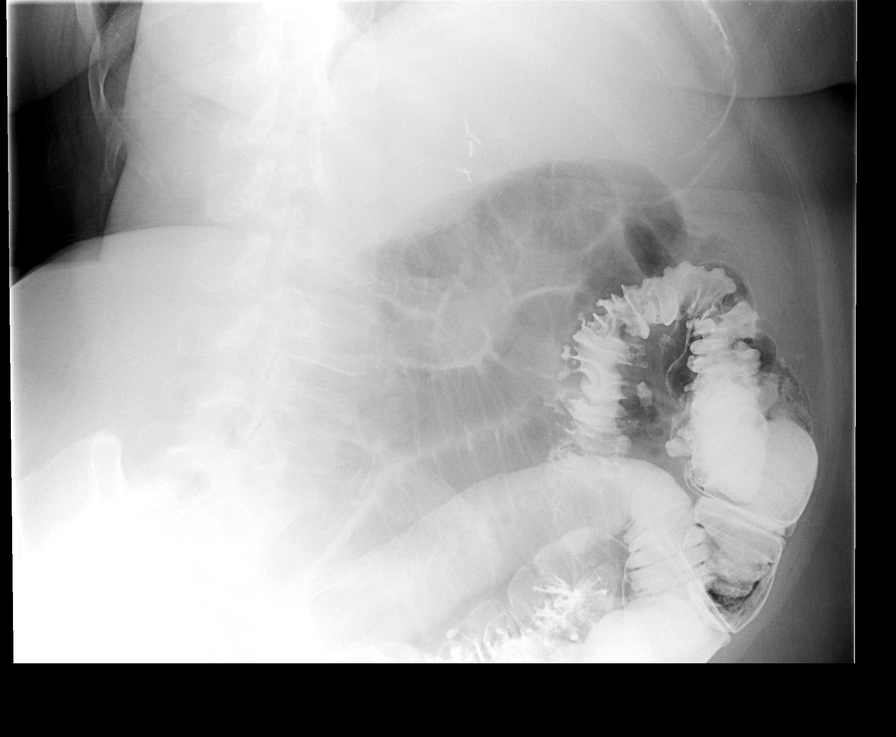

[15 of 24 positions shown; findings below may reference images not displayed]

FINDINGS: Scout views shows a large amount of gas in the large and
small bowel from earlier colonoscopy.

Barium was introduced into the rectum and advanced to the mid
transverse colon with some difficulty due to abdominal discomfort.

There is a persistent narrowing in the mid descending colon at
about the level of the iliac crest over a  length of about 5 cm.
There are diverticula in the colon proximal and distal to this
lesion and so this could be inflammatory.  The patient was given
the 0.75 mg glucagon intravenously.  Repeat imaging after about 10
minutes shows no significant change in the luminal narrowing in
this segment.  This is worrisome for colon cancer.  It is still
possible this is an inflammatory lesion that is refractory to
glucagon.

No other lesions are identified.  I was not able to reflux barium
into the small bowel because the patient was quite uncomfortable.

The findings were personally phoned to the patient's referring
physician, Dr. GANBOLD.
IMPRESSION: 1.  Relatively long segment area of luminal narrowing and
irregularity in the mid descending colon.  This lesion did not
change after intravenous glucagon and is therefore worrisome for
carcinoma.  See above discussion.
2.  No other findings of significance.

## 2009-12-07 IMAGING — CR DG CHEST 2V
2 series · 2 of 2 positions shown · non-contrast
Comparison: None

CLINICAL DATA: Left colon lesion.

CHEST - 2 VIEW

[view not recorded (1 of 2)]
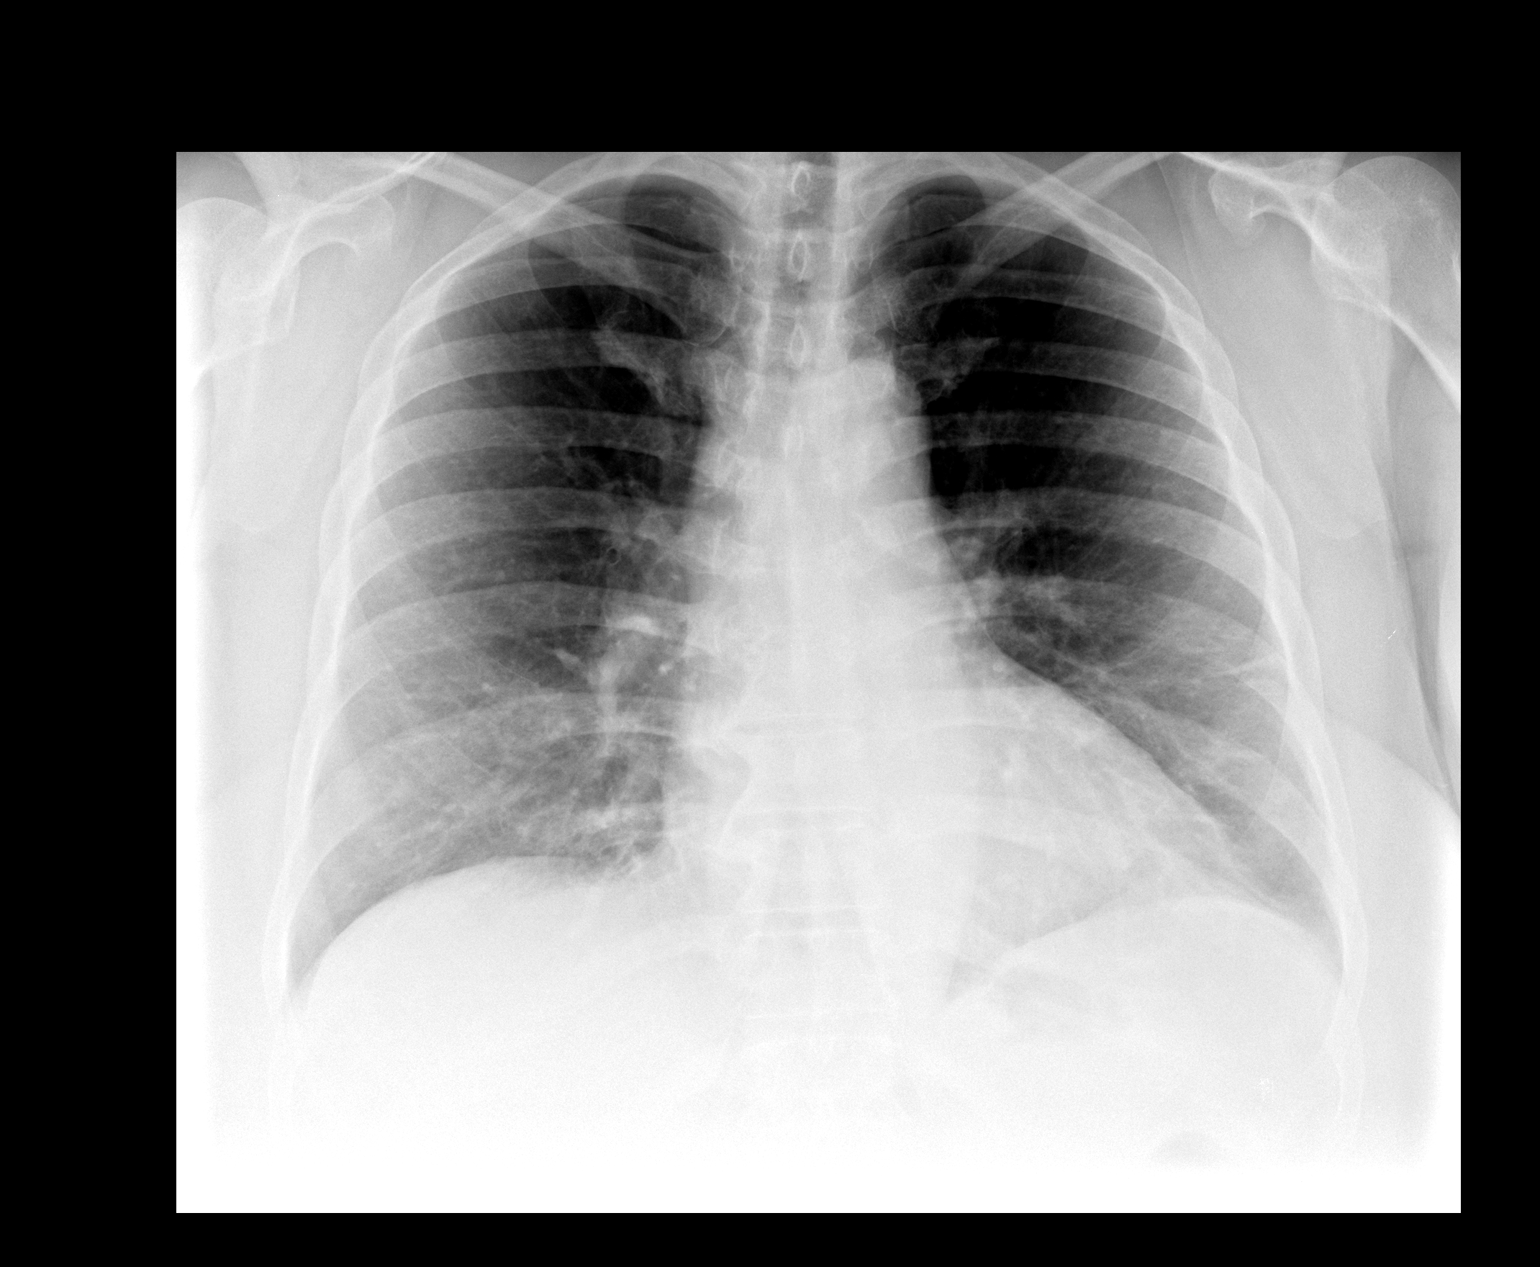

[view not recorded (2 of 2)]
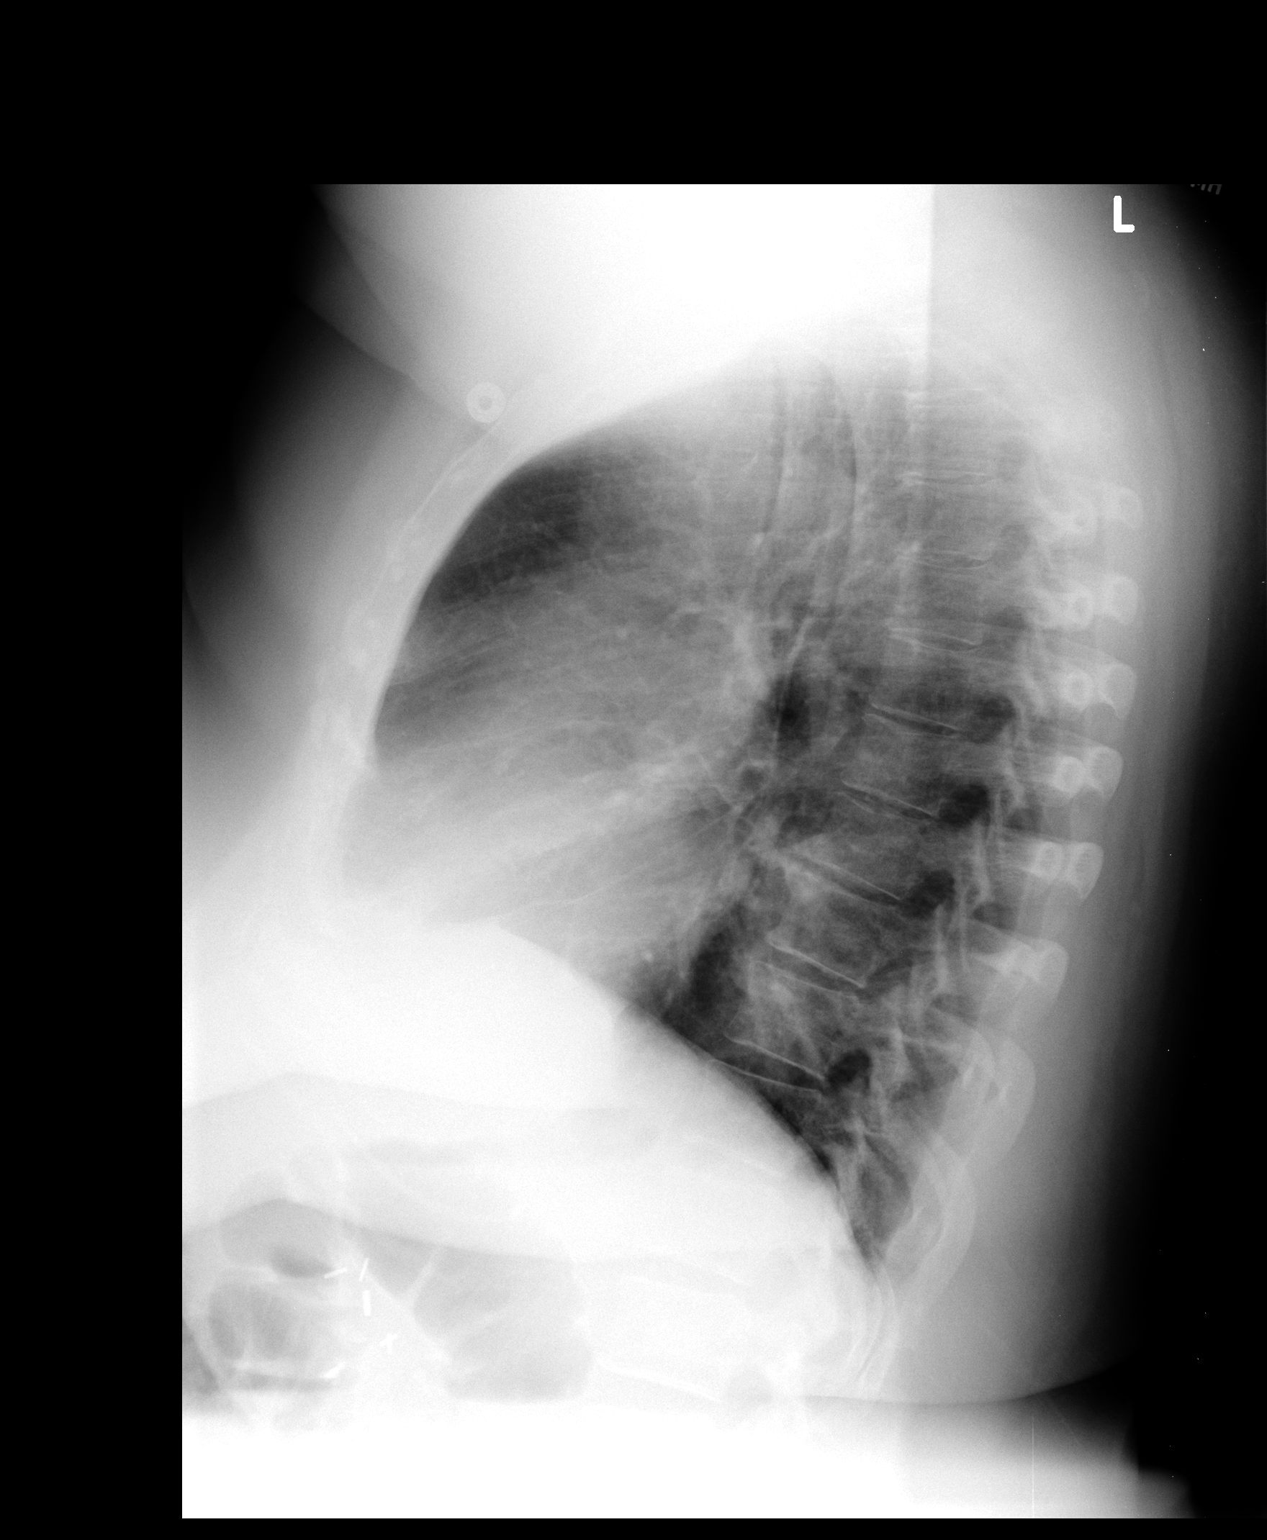

[2 of 2 positions shown; findings below may reference images not displayed]

FINDINGS: The heart is borderline in size.  The mediastinal and
hilar contours are within normal limits.  There is streaky
bibasilar atelectasis but no infiltrates, edema or effusions.  The
bony thorax is intact.
IMPRESSION: Streaky bibasilar atelectasis.

## 2009-12-08 ENCOUNTER — Encounter (INDEPENDENT_AMBULATORY_CARE_PROVIDER_SITE_OTHER): Payer: Self-pay | Admitting: General Surgery

## 2010-02-07 ENCOUNTER — Encounter: Admission: RE | Admit: 2010-02-07 | Discharge: 2010-02-07 | Payer: Self-pay | Admitting: General Surgery

## 2010-02-07 IMAGING — CT CT ABD-PELV W/O CM
3 of 4 series · 13 of 36 positions shown, 19 images · non-contrast
Comparison: CT [DATE]

CLINICAL DATA: Increasing incisional pain.  Evaluate for abscess.

CT ABDOMEN AND PELVIS WITHOUT CONTRAST
TECHNIQUE: Multidetector CT imaging of the abdomen and pelvis was
performed following the standard protocol without intravenous
contrast.

[Series 3: abdomen/pelvis without · axial · non-contrast · 0.83mm/px · z∈[-349,-39]mm · 7 of 84 slices shown, 12 images]
[im 11/84  soft-tissue]
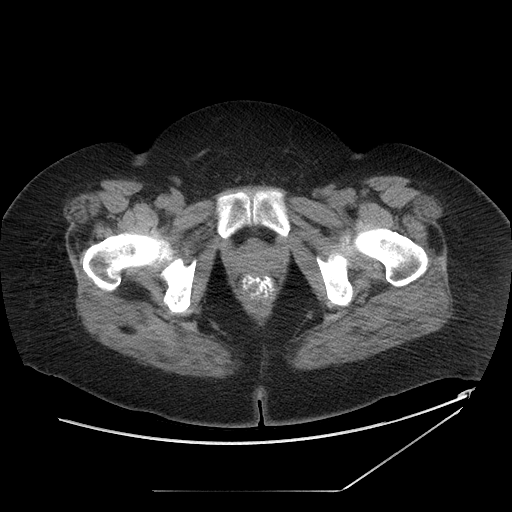
[im 11/84  bone]
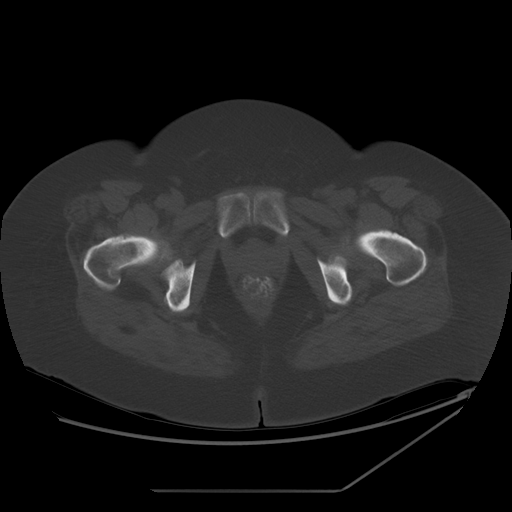
[im 21/84  soft-tissue]
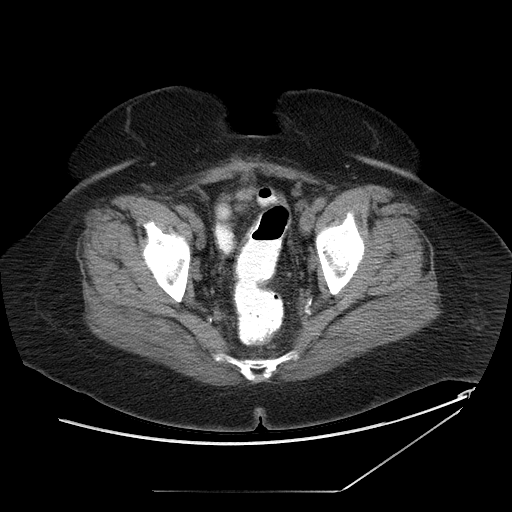
[im 32/84  soft-tissue]
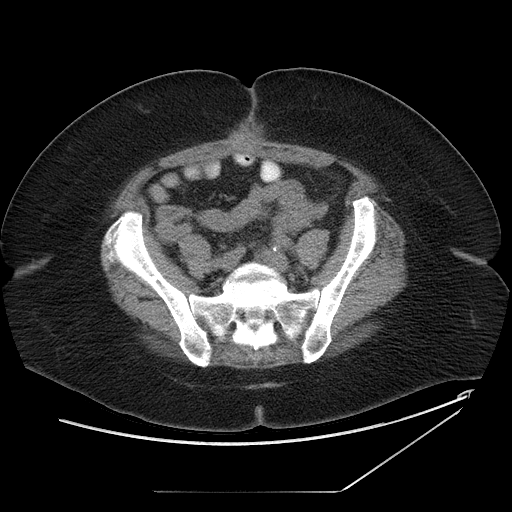
[im 42/84  soft-tissue]
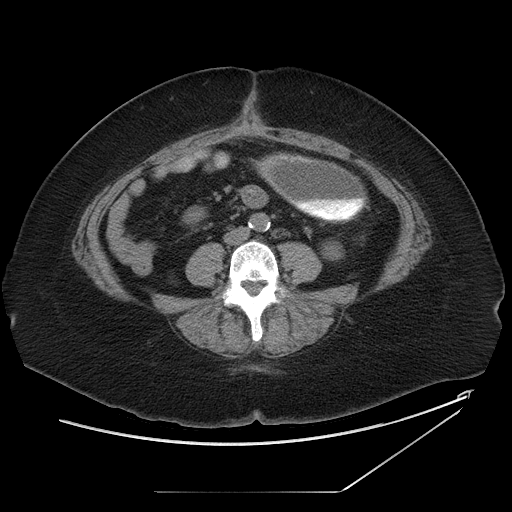
[im 42/84  lung]
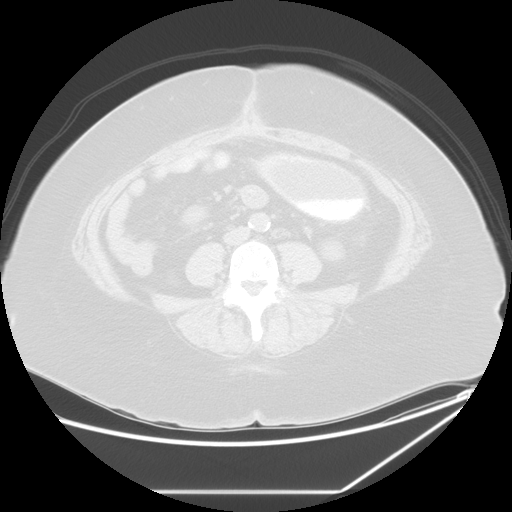
[im 52/84  soft-tissue]
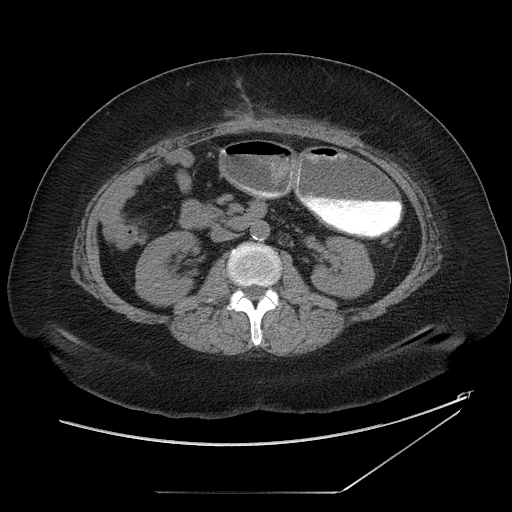
[im 52/84  lung]
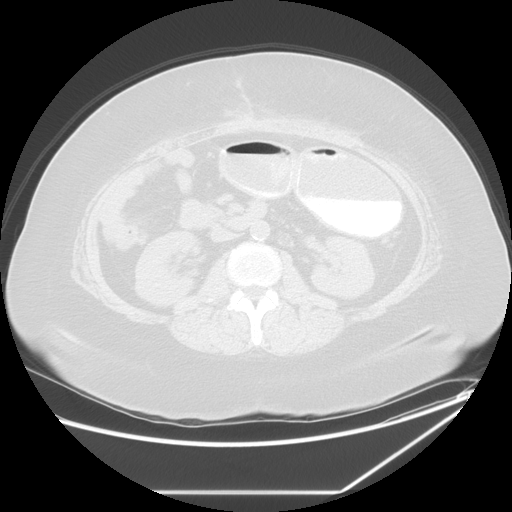
[im 63/84  soft-tissue]
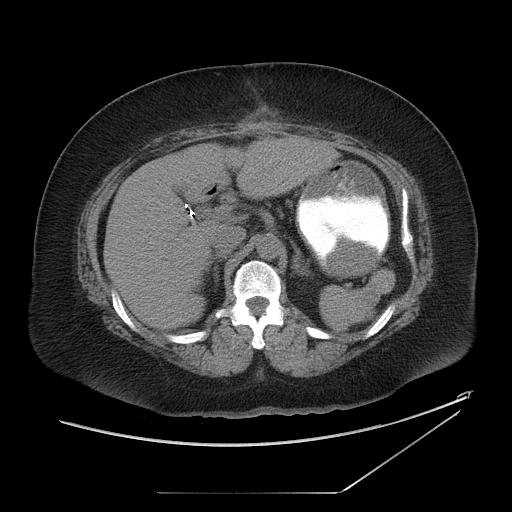
[im 63/84  lung]
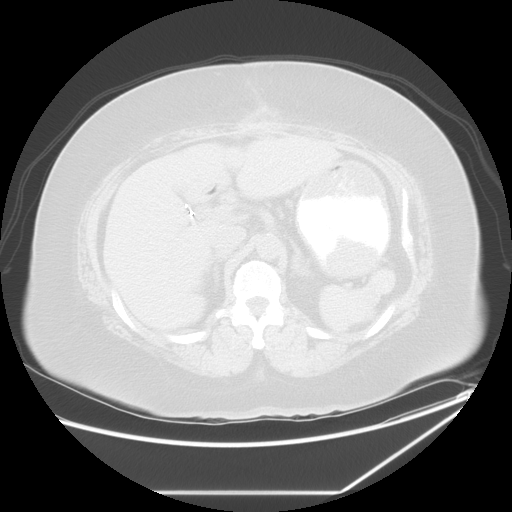
[im 73/84  soft-tissue]
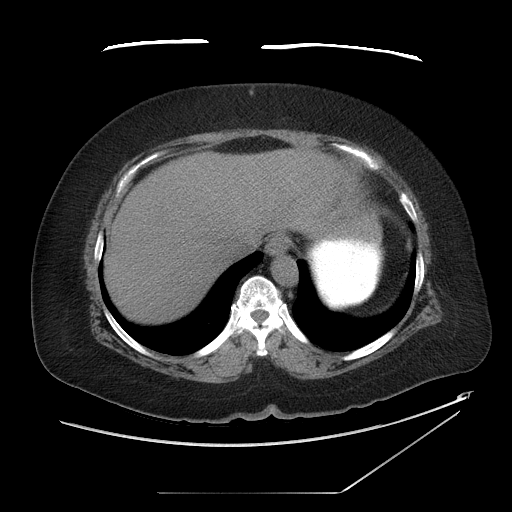
[im 73/84  lung]
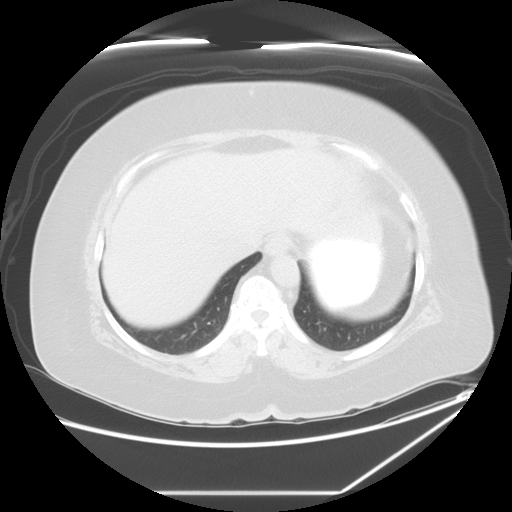

[Series 601: coronal body · coronal · 0.85mm/px · 1 of 129 slices shown, 2 images]
[im 43/129  soft-tissue]
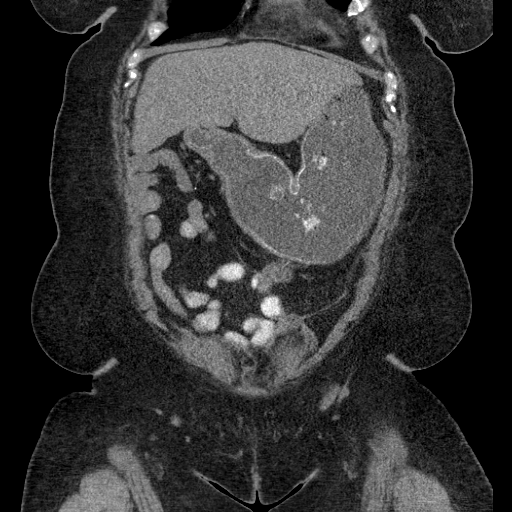
[im 43/129  bone]
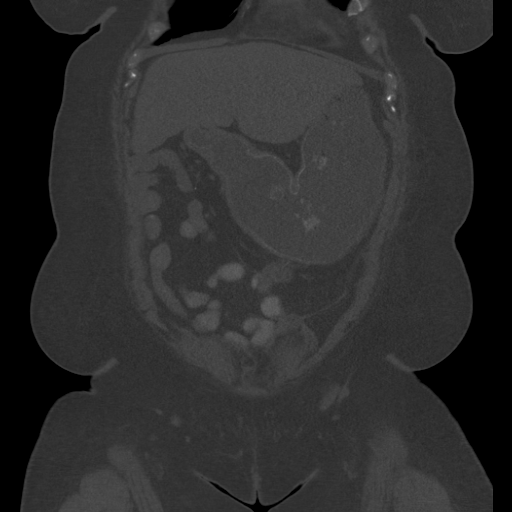

[Series 602: sagittal body · sagittal · 0.85mm/px · 5 of 172 slices shown]
[im 19/172  soft-tissue]
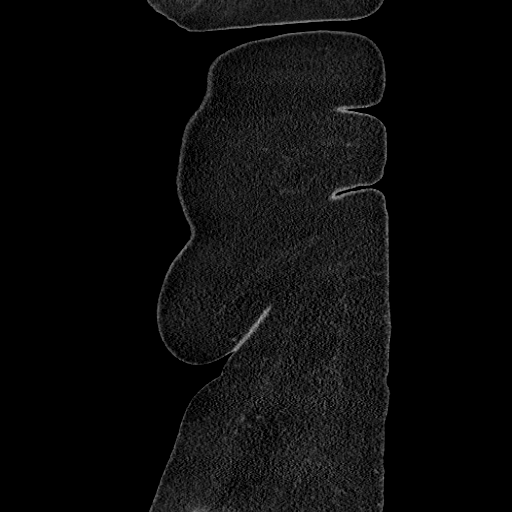
[im 37/172  soft-tissue]
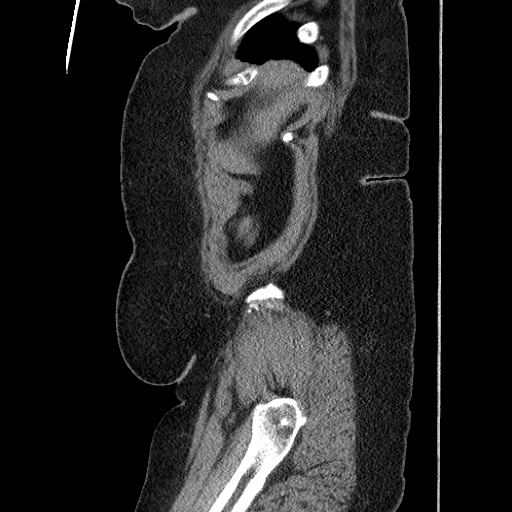
[im 55/172  soft-tissue]
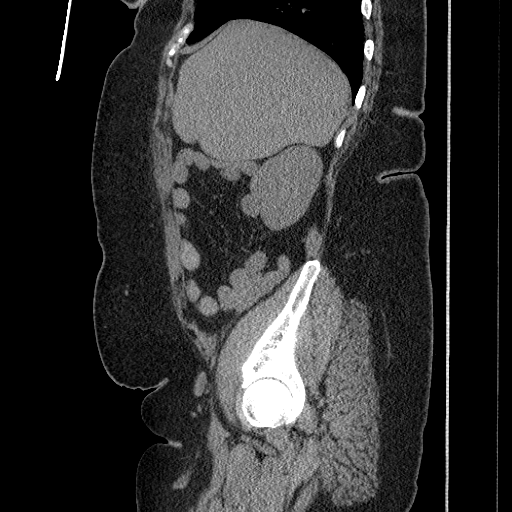
[im 73/172  soft-tissue]
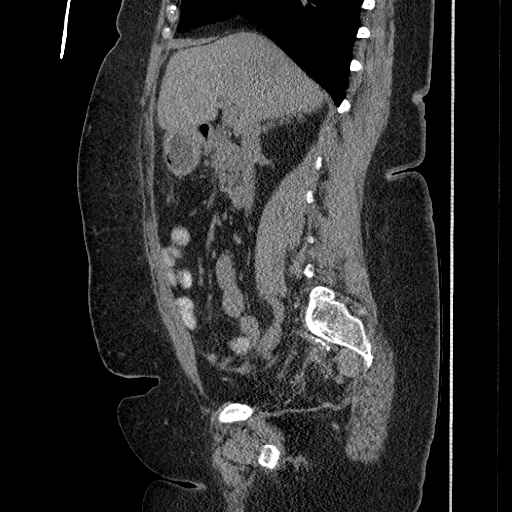
[im 100/172  soft-tissue]
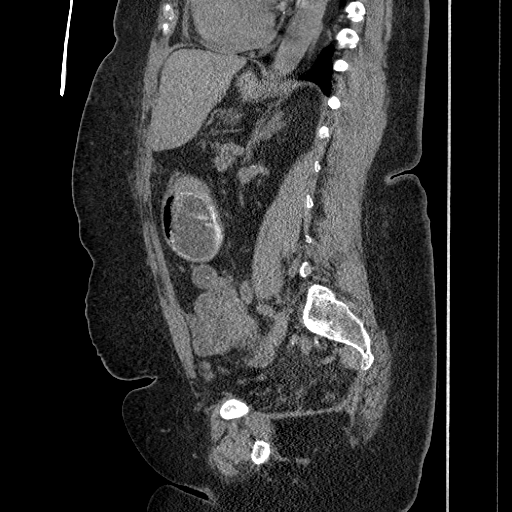

[13 of 36 positions shown; findings below may reference images not displayed]

FINDINGS: Linear densities in the lung bases, likely scarring.
Otherwise lungs bases are clear.  No effusions.  Heart is normal
size.

Abdomen:  Prior cholecystectomy.  Lobular contours to the spleen
are stable.  Liver, pancreas, adrenals, kidneys unremarkable.  No
biliary duct dilatation.  No hydronephrosis.

Bowel grossly unremarkable.  No free fluid, free air, or
adenopathy. Aorta is calcified, normal size.  No free fluid, free
air or adenopathy.

Pelvis:  Midline incision noted.  There is gas noted within the
midline incision in the pelvis.  Question recent surgery.
Postoperative changes in the region of the sigmoid colon.  No free
fluid or adenopathy.  Small bowel unremarkable.
IMPRESSION: Midline incision noted.  There is gas within the midline incision
site in the anterior pelvic wall.  Question recent surgery.

Prior cholecystectomy.

## 2010-12-30 HISTORY — PX: OTHER SURGICAL HISTORY: SHX169

## 2011-01-21 ENCOUNTER — Encounter: Payer: Self-pay | Admitting: Family Medicine

## 2011-04-02 LAB — COMPREHENSIVE METABOLIC PANEL
ALT: 37 U/L — ABNORMAL HIGH (ref 0–35)
ALT: 41 U/L — ABNORMAL HIGH (ref 0–35)
AST: 28 U/L (ref 0–37)
AST: 35 U/L (ref 0–37)
Albumin: 2.8 g/dL — ABNORMAL LOW (ref 3.5–5.2)
Albumin: 3.2 g/dL — ABNORMAL LOW (ref 3.5–5.2)
Alkaline Phosphatase: 90 U/L (ref 39–117)
Alkaline Phosphatase: 96 U/L (ref 39–117)
BUN: 25 mg/dL — ABNORMAL HIGH (ref 6–23)
BUN: 26 mg/dL — ABNORMAL HIGH (ref 6–23)
CO2: 24 mEq/L (ref 19–32)
CO2: 27 mEq/L (ref 19–32)
Calcium: 8.2 mg/dL — ABNORMAL LOW (ref 8.4–10.5)
Calcium: 8.4 mg/dL (ref 8.4–10.5)
Chloride: 105 mEq/L (ref 96–112)
Chloride: 105 mEq/L (ref 96–112)
Creatinine, Ser: 1.36 mg/dL — ABNORMAL HIGH (ref 0.4–1.2)
Creatinine, Ser: 1.5 mg/dL — ABNORMAL HIGH (ref 0.4–1.2)
GFR calc Af Amer: 44 mL/min — ABNORMAL LOW (ref >60)
GFR calc Af Amer: 49 mL/min — ABNORMAL LOW (ref >60)
GFR calc non Af Amer: 36 mL/min — ABNORMAL LOW (ref >60)
GFR calc non Af Amer: 41 mL/min — ABNORMAL LOW (ref >60)
Glucose, Bld: 115 mg/dL — ABNORMAL HIGH (ref 70–99)
Glucose, Bld: 137 mg/dL — ABNORMAL HIGH (ref 70–99)
Potassium: 3.7 mEq/L (ref 3.5–5.1)
Potassium: 3.7 mEq/L (ref 3.5–5.1)
Sodium: 137 mEq/L (ref 135–145)
Sodium: 139 mEq/L (ref 135–145)
Total Bilirubin: 0.8 mg/dL (ref 0.3–1.2)
Total Bilirubin: 0.8 mg/dL (ref 0.3–1.2)
Total Protein: 6.9 g/dL (ref 6.0–8.3)
Total Protein: 7.5 g/dL (ref 6.0–8.3)

## 2011-04-02 LAB — BASIC METABOLIC PANEL
BUN: 12 mg/dL (ref 6–23)
BUN: 16 mg/dL (ref 6–23)
BUN: 6 mg/dL (ref 6–23)
BUN: 6 mg/dL (ref 6–23)
CO2: 25 mEq/L (ref 19–32)
CO2: 26 mEq/L (ref 19–32)
CO2: 27 mEq/L (ref 19–32)
CO2: 27 mEq/L (ref 19–32)
Calcium: 7.6 mg/dL — ABNORMAL LOW (ref 8.4–10.5)
Calcium: 7.7 mg/dL — ABNORMAL LOW (ref 8.4–10.5)
Calcium: 8 mg/dL — ABNORMAL LOW (ref 8.4–10.5)
Calcium: 8.1 mg/dL — ABNORMAL LOW (ref 8.4–10.5)
Chloride: 103 mEq/L (ref 96–112)
Chloride: 104 mEq/L (ref 96–112)
Chloride: 104 mEq/L (ref 96–112)
Chloride: 111 mEq/L (ref 96–112)
Creatinine, Ser: 0.93 mg/dL (ref 0.4–1.2)
Creatinine, Ser: 1.03 mg/dL (ref 0.4–1.2)
Creatinine, Ser: 1.06 mg/dL (ref 0.4–1.2)
Creatinine, Ser: 1.07 mg/dL (ref 0.4–1.2)
GFR calc Af Amer: >60 mL/min (ref >60)
GFR calc Af Amer: >60 mL/min (ref >60)
GFR calc Af Amer: >60 mL/min (ref >60)
GFR calc Af Amer: >60 mL/min (ref >60)
GFR calc non Af Amer: 53 mL/min — ABNORMAL LOW (ref >60)
GFR calc non Af Amer: 54 mL/min — ABNORMAL LOW (ref >60)
GFR calc non Af Amer: 56 mL/min — ABNORMAL LOW (ref >60)
GFR calc non Af Amer: >60 mL/min (ref >60)
Glucose, Bld: 109 mg/dL — ABNORMAL HIGH (ref 70–99)
Glucose, Bld: 118 mg/dL — ABNORMAL HIGH (ref 70–99)
Glucose, Bld: 124 mg/dL — ABNORMAL HIGH (ref 70–99)
Glucose, Bld: 139 mg/dL — ABNORMAL HIGH (ref 70–99)
Potassium: 3.7 mEq/L (ref 3.5–5.1)
Potassium: 4.3 mEq/L (ref 3.5–5.1)
Potassium: 4.4 mEq/L (ref 3.5–5.1)
Potassium: 5.1 mEq/L (ref 3.5–5.1)
Sodium: 135 mEq/L (ref 135–145)
Sodium: 136 mEq/L (ref 135–145)
Sodium: 136 mEq/L (ref 135–145)
Sodium: 143 mEq/L (ref 135–145)

## 2011-04-02 LAB — PROTIME-INR
INR: 1.03 (ref 0.00–1.49)
Prothrombin Time: 13.4 seconds (ref 11.6–15.2)

## 2011-04-02 LAB — DIFFERENTIAL
Basophils Absolute: 0 10*3/uL (ref 0.0–0.1)
Basophils Absolute: 0 10*3/uL (ref 0.0–0.1)
Basophils Relative: 0 % (ref 0–1)
Basophils Relative: 0 % (ref 0–1)
Eosinophils Absolute: 0.1 10*3/uL (ref 0.0–0.7)
Eosinophils Absolute: 0.1 10*3/uL (ref 0.0–0.7)
Eosinophils Relative: 1 % (ref 0–5)
Eosinophils Relative: 1 % (ref 0–5)
Lymphocytes Relative: 22 % (ref 12–46)
Lymphocytes Relative: 31 % (ref 12–46)
Lymphs Abs: 3.4 10*3/uL (ref 0.7–4.0)
Lymphs Abs: 3.6 10*3/uL (ref 0.7–4.0)
Monocytes Absolute: 0.6 10*3/uL (ref 0.1–1.0)
Monocytes Absolute: 0.8 10*3/uL (ref 0.1–1.0)
Monocytes Relative: 5 % (ref 3–12)
Monocytes Relative: 5 % (ref 3–12)
Neutro Abs: 10.9 10*3/uL — ABNORMAL HIGH (ref 1.7–7.7)
Neutro Abs: 7.4 10*3/uL (ref 1.7–7.7)
Neutrophils Relative %: 63 % (ref 43–77)
Neutrophils Relative %: 72 % (ref 43–77)

## 2011-04-02 LAB — CBC
HCT: 32.1 % — ABNORMAL LOW (ref 36.0–46.0)
HCT: 37.1 % (ref 36.0–46.0)
HCT: 37.4 % (ref 36.0–46.0)
HCT: 39.7 % (ref 36.0–46.0)
Hemoglobin: 10.6 g/dL — ABNORMAL LOW (ref 12.0–15.0)
Hemoglobin: 12.2 g/dL (ref 12.0–15.0)
Hemoglobin: 12.6 g/dL (ref 12.0–15.0)
Hemoglobin: 13.2 g/dL (ref 12.0–15.0)
MCHC: 32.8 g/dL (ref 30.0–36.0)
MCHC: 33 g/dL (ref 30.0–36.0)
MCHC: 33.3 g/dL (ref 30.0–36.0)
MCHC: 33.5 g/dL (ref 30.0–36.0)
MCV: 84.2 fL (ref 78.0–100.0)
MCV: 84.3 fL (ref 78.0–100.0)
MCV: 84.5 fL (ref 78.0–100.0)
MCV: 85.2 fL (ref 78.0–100.0)
Platelets: 203 10*3/uL (ref 150–400)
Platelets: 238 10*3/uL (ref 150–400)
Platelets: 251 10*3/uL (ref 150–400)
Platelets: 252 10*3/uL (ref 150–400)
RBC: 3.77 MIL/uL — ABNORMAL LOW (ref 3.87–5.11)
RBC: 4.39 MIL/uL (ref 3.87–5.11)
RBC: 4.44 MIL/uL (ref 3.87–5.11)
RBC: 4.71 MIL/uL (ref 3.87–5.11)
RDW: 14.7 % (ref 11.5–15.5)
RDW: 14.7 % (ref 11.5–15.5)
RDW: 14.9 % (ref 11.5–15.5)
RDW: 14.9 % (ref 11.5–15.5)
WBC: 11.8 10*3/uL — ABNORMAL HIGH (ref 4.0–10.5)
WBC: 12.3 10*3/uL — ABNORMAL HIGH (ref 4.0–10.5)
WBC: 13.1 10*3/uL — ABNORMAL HIGH (ref 4.0–10.5)
WBC: 15.3 10*3/uL — ABNORMAL HIGH (ref 4.0–10.5)

## 2011-04-02 LAB — TYPE AND SCREEN
ABO/RH(D): B POS
Antibody Screen: NEGATIVE

## 2011-04-02 LAB — CEA: CEA: 6 ng/mL — ABNORMAL HIGH (ref 0.0–5.0)

## 2011-04-02 LAB — ABO/RH: ABO/RH(D): B POS

## 2011-04-02 LAB — MAGNESIUM
Magnesium: 1.8 mg/dL (ref 1.5–2.5)
Magnesium: 2.4 mg/dL (ref 1.5–2.5)

## 2011-04-02 LAB — APTT: aPTT: 27 seconds (ref 24–37)

## 2011-04-05 LAB — CBC
HCT: 37 % (ref 36.0–46.0)
HCT: 38.6 % (ref 36.0–46.0)
HCT: 41.7 % (ref 36.0–46.0)
Hemoglobin: 12.4 g/dL (ref 12.0–15.0)
Hemoglobin: 12.6 g/dL (ref 12.0–15.0)
Hemoglobin: 13.9 g/dL (ref 12.0–15.0)
MCHC: 32.6 g/dL (ref 30.0–36.0)
MCHC: 33.3 g/dL (ref 30.0–36.0)
MCHC: 33.5 g/dL (ref 30.0–36.0)
MCV: 82 fL (ref 78.0–100.0)
MCV: 82.6 fL (ref 78.0–100.0)
MCV: 82.6 fL (ref 78.0–100.0)
Platelets: 214 10*3/uL (ref 150–400)
Platelets: 223 10*3/uL (ref 150–400)
Platelets: 268 10*3/uL (ref 150–400)
RBC: 4.52 MIL/uL (ref 3.87–5.11)
RBC: 4.68 MIL/uL (ref 3.87–5.11)
RBC: 5.05 MIL/uL (ref 3.87–5.11)
RDW: 15.1 % (ref 11.5–15.5)
RDW: 15.4 % (ref 11.5–15.5)
RDW: 15.7 % — ABNORMAL HIGH (ref 11.5–15.5)
WBC: 14.1 10*3/uL — ABNORMAL HIGH (ref 4.0–10.5)
WBC: 14.8 10*3/uL — ABNORMAL HIGH (ref 4.0–10.5)
WBC: 17.1 10*3/uL — ABNORMAL HIGH (ref 4.0–10.5)

## 2011-04-05 LAB — HEPATITIS PANEL, ACUTE
HCV Ab: REACTIVE — AB
Hep A IgM: NEGATIVE
Hep B C IgM: NEGATIVE
Hepatitis B Surface Ag: NEGATIVE

## 2011-04-05 LAB — LIPID PANEL
Cholesterol: 192 mg/dL (ref 0–200)
HDL: 34 mg/dL — ABNORMAL LOW (ref >39)
LDL Cholesterol: 127 mg/dL — ABNORMAL HIGH (ref 0–99)
Total CHOL/HDL Ratio: 5.6 RATIO
Triglycerides: 154 mg/dL — ABNORMAL HIGH (ref <150)
VLDL: 31 mg/dL (ref 0–40)

## 2011-04-05 LAB — COMPREHENSIVE METABOLIC PANEL
ALT: 53 U/L — ABNORMAL HIGH (ref 0–35)
ALT: 67 U/L — ABNORMAL HIGH (ref 0–35)
ALT: 98 U/L — ABNORMAL HIGH (ref 0–35)
AST: 26 U/L (ref 0–37)
AST: 31 U/L (ref 0–37)
AST: 52 U/L — ABNORMAL HIGH (ref 0–37)
Albumin: 2.9 g/dL — ABNORMAL LOW (ref 3.5–5.2)
Albumin: 2.9 g/dL — ABNORMAL LOW (ref 3.5–5.2)
Albumin: 3.3 g/dL — ABNORMAL LOW (ref 3.5–5.2)
Alkaline Phosphatase: 106 U/L (ref 39–117)
Alkaline Phosphatase: 118 U/L — ABNORMAL HIGH (ref 39–117)
Alkaline Phosphatase: 98 U/L (ref 39–117)
BUN: 11 mg/dL (ref 6–23)
BUN: 18 mg/dL (ref 6–23)
BUN: 27 mg/dL — ABNORMAL HIGH (ref 6–23)
CO2: 23 mEq/L (ref 19–32)
CO2: 24 mEq/L (ref 19–32)
CO2: 27 mEq/L (ref 19–32)
Calcium: 8.3 mg/dL — ABNORMAL LOW (ref 8.4–10.5)
Calcium: 8.7 mg/dL (ref 8.4–10.5)
Calcium: 9.1 mg/dL (ref 8.4–10.5)
Chloride: 104 mEq/L (ref 96–112)
Chloride: 105 mEq/L (ref 96–112)
Chloride: 107 mEq/L (ref 96–112)
Creatinine, Ser: 1.04 mg/dL (ref 0.4–1.2)
Creatinine, Ser: 1.1 mg/dL (ref 0.4–1.2)
Creatinine, Ser: 1.28 mg/dL — ABNORMAL HIGH (ref 0.4–1.2)
GFR calc Af Amer: 53 mL/min — ABNORMAL LOW (ref >60)
GFR calc Af Amer: >60 mL/min (ref >60)
GFR calc Af Amer: >60 mL/min (ref >60)
GFR calc non Af Amer: 43 mL/min — ABNORMAL LOW (ref >60)
GFR calc non Af Amer: 52 mL/min — ABNORMAL LOW (ref >60)
GFR calc non Af Amer: 55 mL/min — ABNORMAL LOW (ref >60)
Glucose, Bld: 119 mg/dL — ABNORMAL HIGH (ref 70–99)
Glucose, Bld: 131 mg/dL — ABNORMAL HIGH (ref 70–99)
Glucose, Bld: 99 mg/dL (ref 70–99)
Potassium: 3.9 mEq/L (ref 3.5–5.1)
Potassium: 4.2 mEq/L (ref 3.5–5.1)
Potassium: 4.3 mEq/L (ref 3.5–5.1)
Sodium: 137 mEq/L (ref 135–145)
Sodium: 139 mEq/L (ref 135–145)
Sodium: 140 mEq/L (ref 135–145)
Total Bilirubin: 0.5 mg/dL (ref 0.3–1.2)
Total Bilirubin: 0.6 mg/dL (ref 0.3–1.2)
Total Bilirubin: 0.7 mg/dL (ref 0.3–1.2)
Total Protein: 6.7 g/dL (ref 6.0–8.3)
Total Protein: 6.7 g/dL (ref 6.0–8.3)
Total Protein: 7.9 g/dL (ref 6.0–8.3)

## 2011-04-05 LAB — DIFFERENTIAL
Basophils Absolute: 0.1 10*3/uL (ref 0.0–0.1)
Basophils Relative: 0 % (ref 0–1)
Eosinophils Absolute: 0 10*3/uL (ref 0.0–0.7)
Eosinophils Relative: 0 % (ref 0–5)
Lymphocytes Relative: 10 % — ABNORMAL LOW (ref 12–46)
Lymphs Abs: 1.7 10*3/uL (ref 0.7–4.0)
Monocytes Absolute: 0.4 10*3/uL (ref 0.1–1.0)
Monocytes Relative: 3 % (ref 3–12)
Neutro Abs: 14.9 10*3/uL — ABNORMAL HIGH (ref 1.7–7.7)
Neutrophils Relative %: 87 % — ABNORMAL HIGH (ref 43–77)

## 2011-04-05 LAB — CULTURE, BLOOD (ROUTINE X 2)
Culture: NO GROWTH
Culture: NO GROWTH

## 2011-04-05 LAB — URINALYSIS, ROUTINE W REFLEX MICROSCOPIC
Bilirubin Urine: NEGATIVE
Glucose, UA: NEGATIVE mg/dL
Hgb urine dipstick: NEGATIVE
Ketones, ur: NEGATIVE mg/dL
Nitrite: NEGATIVE
Protein, ur: NEGATIVE mg/dL
Specific Gravity, Urine: 1.022 (ref 1.005–1.030)
Urobilinogen, UA: 0.2 mg/dL (ref 0.0–1.0)
pH: 5.5 (ref 5.0–8.0)

## 2011-04-05 LAB — HEMOGLOBIN A1C
Hgb A1c MFr Bld: 6.5 % — ABNORMAL HIGH (ref 4.6–6.1)
Mean Plasma Glucose: 140 mg/dL

## 2011-04-05 LAB — LIPASE, BLOOD: Lipase: 23 U/L (ref 11–59)

## 2011-10-29 ENCOUNTER — Other Ambulatory Visit: Payer: Self-pay | Admitting: Internal Medicine

## 2011-10-29 DIAGNOSIS — G44009 Cluster headache syndrome, unspecified, not intractable: Secondary | ICD-10-CM

## 2011-10-31 ENCOUNTER — Ambulatory Visit
Admission: RE | Admit: 2011-10-31 | Discharge: 2011-10-31 | Disposition: A | Discharge status: Home / Self Care | Payer: Medicare Other | Source: Ambulatory Visit | Attending: Internal Medicine | Admitting: Internal Medicine

## 2011-10-31 DIAGNOSIS — G44009 Cluster headache syndrome, unspecified, not intractable: Secondary | ICD-10-CM

## 2011-10-31 IMAGING — CT CT HEAD W/O CM
4 of 6 series · 14 of 33 positions shown, 16 images · non-contrast
Comparison: None.

CT HEAD WITHOUT CONTRAST

CLINICAL DATA: 55-year-old female with headache radiating to the
left side.  History of spinal meningitis of the child.  History of
IV contrast allergy.
TECHNIQUE: Contiguous axial images were obtained from the base of
the skull through the vertex without contrast.
TECHNIQUE: Multidetector CT imaging of the neck was performed
following the standard protocol without intravenous contrast.

[Series 5: axial neck · axial · 0.42mm/px · z∈[+87,+207]mm · 3 of 98 slices shown, 4 images]
[im 25/98  soft-tissue]
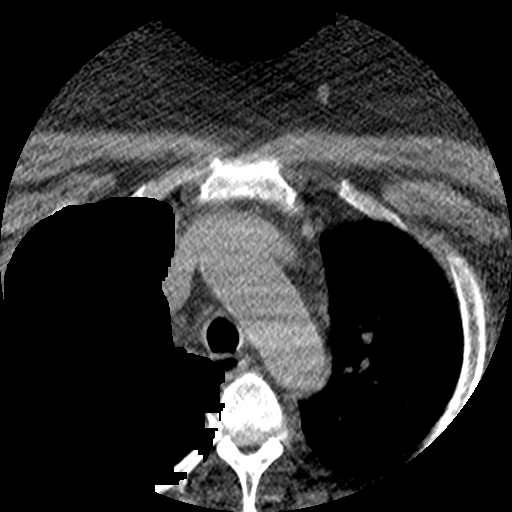
[im 25/98  bone]
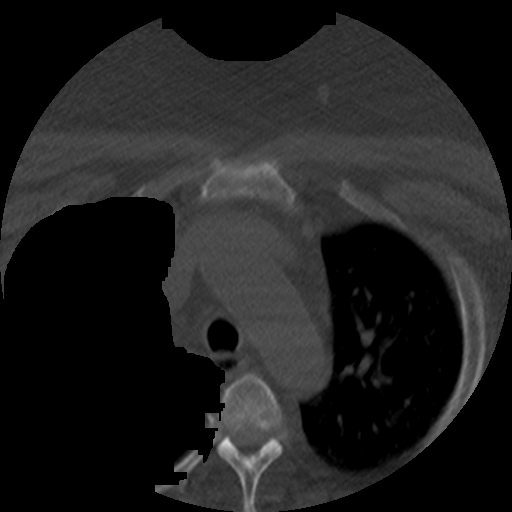
[im 49/98  bone]
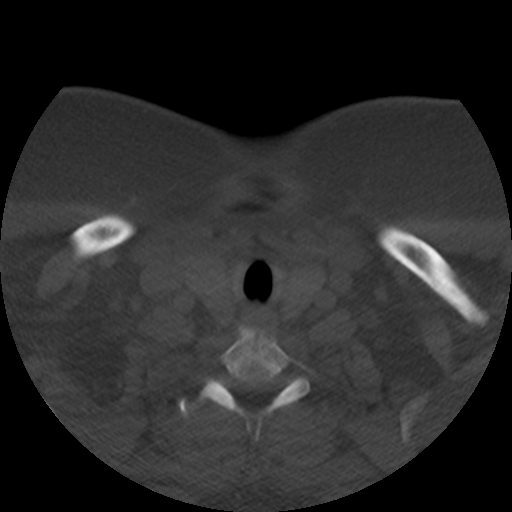
[im 73/98  bone]
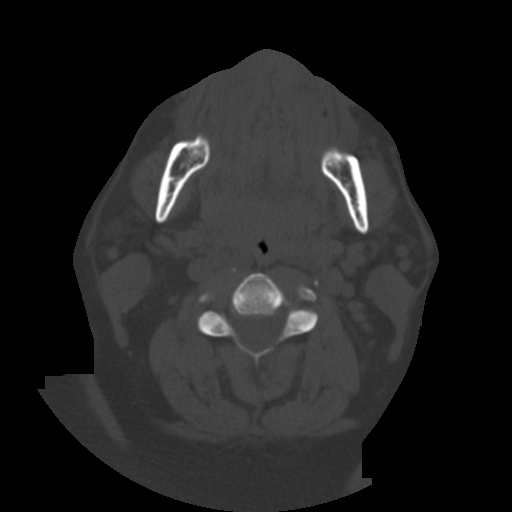

[Series 400: cor · coronal · 0.49mm/px · 3 of 82 slices shown]
[im 17/82  bone]
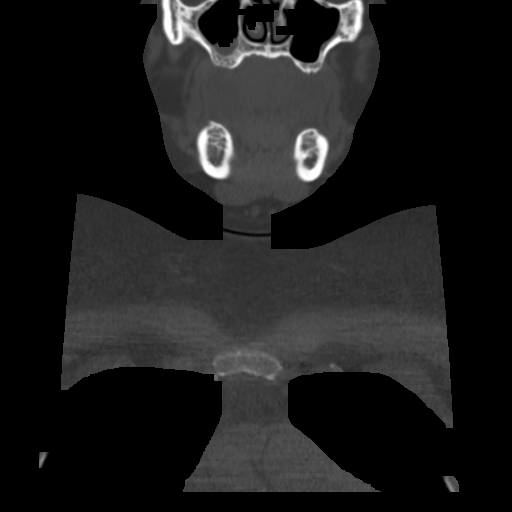
[im 33/82  bone]
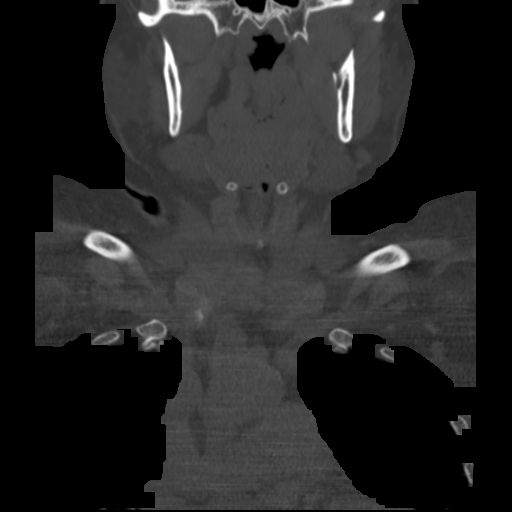
[im 49/82  bone]
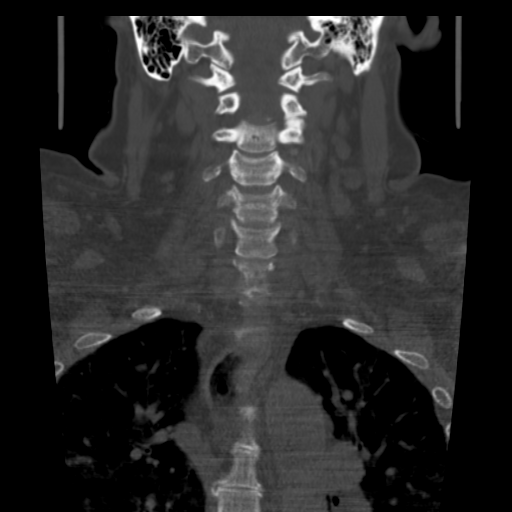

[Series 401: sag · sagittal · 0.49mm/px · 5 of 87 slices shown, 6 images]
[im 29/87  bone]
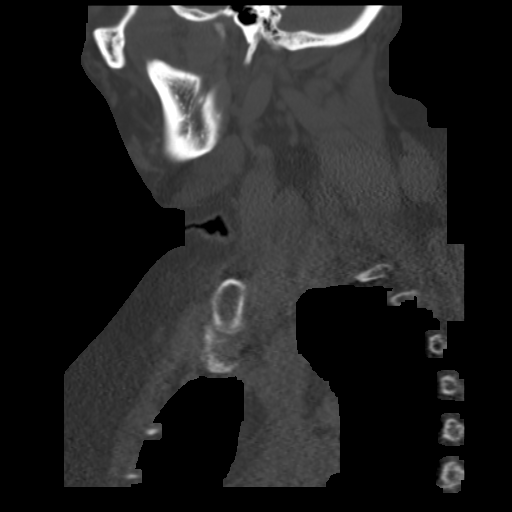
[im 36/87  bone]
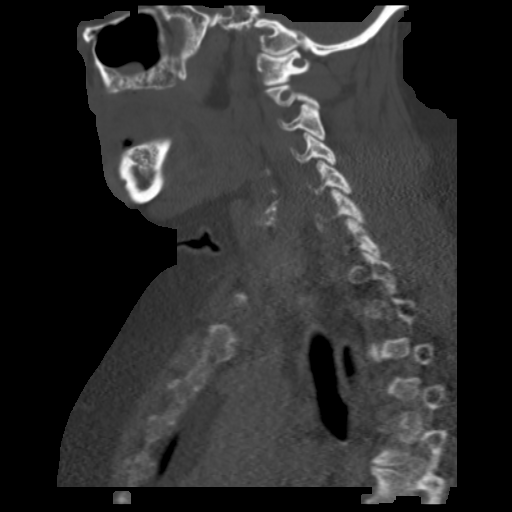
[im 44/87  soft-tissue]
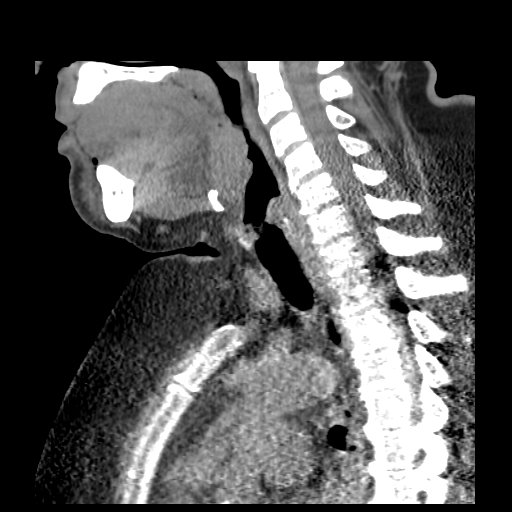
[im 44/87  bone]
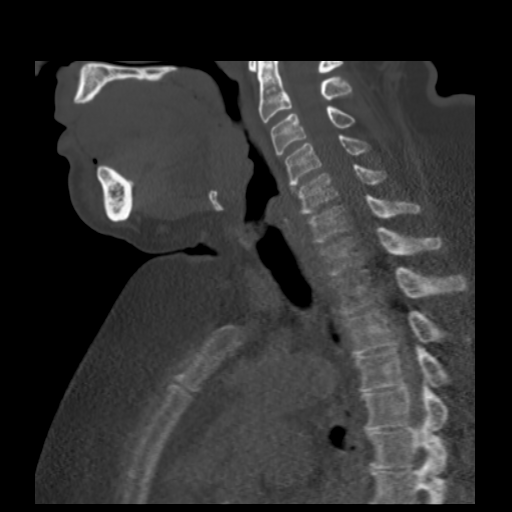
[im 51/87  bone]
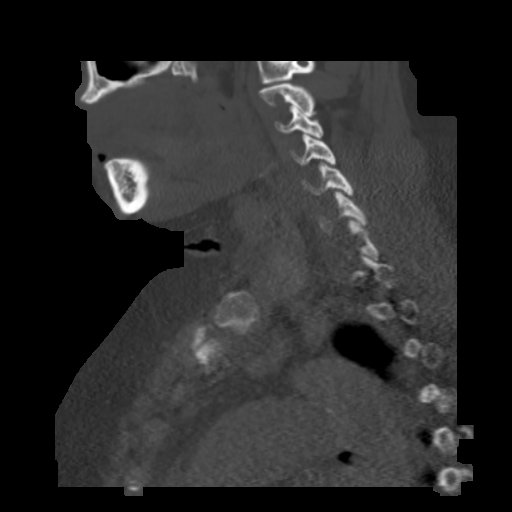
[im 58/87  bone]
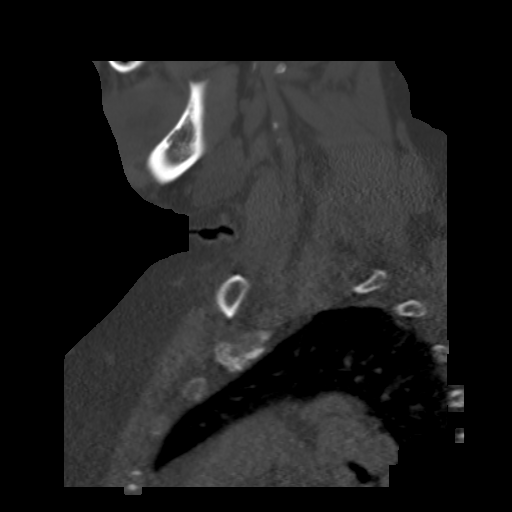

[Series 402: angled axial · axial · 0.43mm/px · z∈[+68,+181]mm · 3 of 96 slices shown]
[im 24/96  bone]
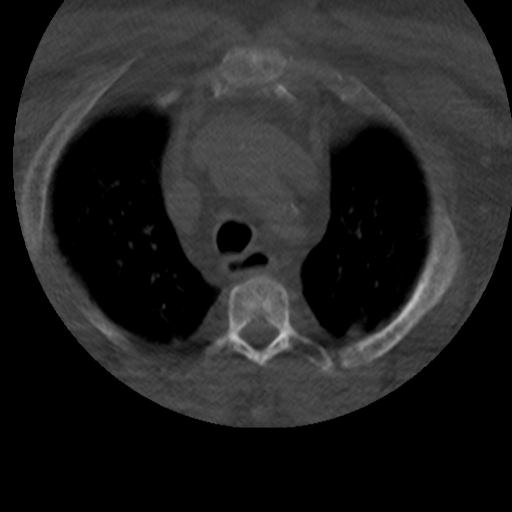
[im 48/96  bone]
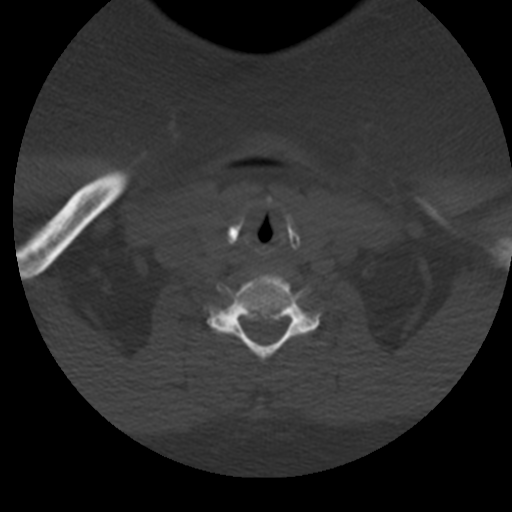
[im 72/96  bone]
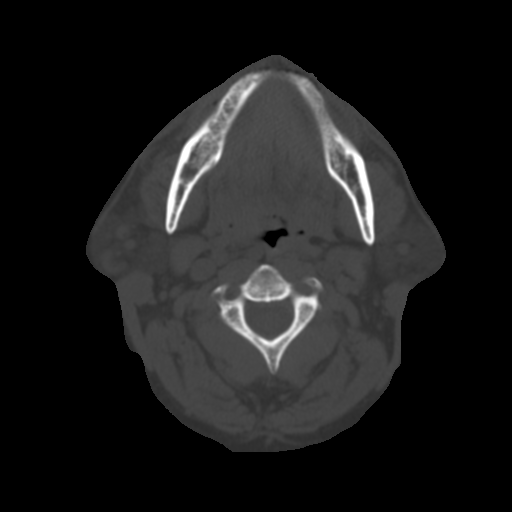

[14 of 33 positions shown; findings below may reference images not displayed]

FINDINGS: Visualized orbit soft tissues are within normal limits.
Visualized scalp soft tissues are within normal limits.  Calvarium
intact.

Incidental cavum septum pellucidum. Cerebral volume is within
normal limits for age.  No midline shift, ventriculomegaly, mass
effect, evidence of mass lesion, intracranial hemorrhage or
evidence of cortically based acute infarction.  Gray-white matter
differentiation is within normal limits throughout the brain.  No
suspicious intracranial vascular hyperdensity.
IMPRESSION: 1. Normal noncontrast CT appearance of the brain.
2.  Neck CT findings are below.

CT NECK WITHOUT CONTRAST
FINDINGS: Visualized chest remarkable for enlargement of the main
pulmonary artery, estimated at 36 mm in diameter (versus ascending
aorta of 32 mm in diameter).  Evidence of coronary artery calcified
atherosclerosis.  No mediastinal lymphadenopathy identified.
Negative visualized lung parenchyma.

Negative thyroid, larynx, pharyngeal mucosal spaces, parapharyngeal
spaces and sublingual space.  Negative retropharyngeal space except
for a retropharyngeal course of the right carotid.  Negative
submandibular and parotid glands.  No cervical lymphadenopathy.
Calcified atherosclerosis at the carotid bifurcations greater on
the left.

Small mucous retention cyst right maxillary sinus.  Occasional
ethmoid opacification.  Other visualized paranasal sinuses and
mastoids are clear.  No acute osseous abnormality identified.
IMPRESSION: 1.  Negative neck soft tissues except for left greater than right
carotid calcified atherosclerosis.
2.  Evidence of pulmonary artery hypertension in the visualized
chest (main PA 36 mm in diameter versus ascending aorta 32 mm).

## 2012-01-02 DIAGNOSIS — R0989 Other specified symptoms and signs involving the circulatory and respiratory systems: Secondary | ICD-10-CM | POA: Diagnosis not present

## 2012-01-02 DIAGNOSIS — R079 Chest pain, unspecified: Secondary | ICD-10-CM | POA: Diagnosis not present

## 2012-01-02 DIAGNOSIS — I10 Essential (primary) hypertension: Secondary | ICD-10-CM | POA: Diagnosis not present

## 2012-01-02 DIAGNOSIS — R0609 Other forms of dyspnea: Secondary | ICD-10-CM | POA: Diagnosis not present

## 2012-01-03 DIAGNOSIS — R079 Chest pain, unspecified: Secondary | ICD-10-CM | POA: Diagnosis not present

## 2012-01-03 DIAGNOSIS — I10 Essential (primary) hypertension: Secondary | ICD-10-CM | POA: Diagnosis not present

## 2012-01-03 DIAGNOSIS — R0609 Other forms of dyspnea: Secondary | ICD-10-CM | POA: Diagnosis not present

## 2012-01-03 DIAGNOSIS — R0989 Other specified symptoms and signs involving the circulatory and respiratory systems: Secondary | ICD-10-CM | POA: Diagnosis not present

## 2012-02-05 DIAGNOSIS — I10 Essential (primary) hypertension: Secondary | ICD-10-CM | POA: Diagnosis not present

## 2012-02-05 DIAGNOSIS — D649 Anemia, unspecified: Secondary | ICD-10-CM | POA: Diagnosis not present

## 2012-02-05 DIAGNOSIS — N2581 Secondary hyperparathyroidism of renal origin: Secondary | ICD-10-CM | POA: Diagnosis not present

## 2012-02-05 DIAGNOSIS — I129 Hypertensive chronic kidney disease with stage 1 through stage 4 chronic kidney disease, or unspecified chronic kidney disease: Secondary | ICD-10-CM | POA: Diagnosis not present

## 2012-02-05 DIAGNOSIS — N183 Chronic kidney disease, stage 3 unspecified: Secondary | ICD-10-CM | POA: Diagnosis not present

## 2012-02-26 DIAGNOSIS — R7301 Impaired fasting glucose: Secondary | ICD-10-CM | POA: Diagnosis not present

## 2012-02-26 DIAGNOSIS — N183 Chronic kidney disease, stage 3 unspecified: Secondary | ICD-10-CM | POA: Diagnosis not present

## 2012-02-26 DIAGNOSIS — F17200 Nicotine dependence, unspecified, uncomplicated: Secondary | ICD-10-CM | POA: Diagnosis not present

## 2012-02-26 DIAGNOSIS — I10 Essential (primary) hypertension: Secondary | ICD-10-CM | POA: Diagnosis not present

## 2012-03-20 DIAGNOSIS — D649 Anemia, unspecified: Secondary | ICD-10-CM | POA: Diagnosis not present

## 2012-04-13 ENCOUNTER — Telehealth: Payer: Self-pay | Admitting: Hematology and Oncology

## 2012-04-13 NOTE — Telephone Encounter (Signed)
S/w pt re appt for 4/30 @ 10 am w/LO.

## 2012-04-17 ENCOUNTER — Telehealth: Payer: Self-pay | Admitting: Hematology and Oncology

## 2012-04-17 NOTE — Telephone Encounter (Signed)
Referred by Dr. Edrick Oh [facility](chronic)

## 2012-04-28 ENCOUNTER — Ambulatory Visit (HOSPITAL_BASED_OUTPATIENT_CLINIC_OR_DEPARTMENT_OTHER): Payer: Medicare Other | Admitting: Hematology and Oncology

## 2012-04-28 ENCOUNTER — Encounter: Payer: Self-pay | Admitting: Hematology and Oncology

## 2012-04-28 ENCOUNTER — Ambulatory Visit (HOSPITAL_COMMUNITY)
Admission: RE | Admit: 2012-04-28 | Discharge: 2012-04-28 | Disposition: A | Discharge status: Home / Self Care | Payer: Medicare Other | Source: Ambulatory Visit | Attending: Hematology and Oncology | Admitting: Hematology and Oncology

## 2012-04-28 ENCOUNTER — Telehealth: Payer: Self-pay | Admitting: Hematology and Oncology

## 2012-04-28 ENCOUNTER — Ambulatory Visit: Payer: Medicare Other

## 2012-04-28 ENCOUNTER — Ambulatory Visit (HOSPITAL_BASED_OUTPATIENT_CLINIC_OR_DEPARTMENT_OTHER): Payer: Medicare Other | Admitting: Lab

## 2012-04-28 ENCOUNTER — Other Ambulatory Visit: Payer: Self-pay | Admitting: Hematology and Oncology

## 2012-04-28 VITALS — BP 146/92 | HR 82 | Temp 97.3°F | Ht 62.0 in | Wt 227.0 lb

## 2012-04-28 DIAGNOSIS — R079 Chest pain, unspecified: Secondary | ICD-10-CM | POA: Diagnosis not present

## 2012-04-28 DIAGNOSIS — D539 Nutritional anemia, unspecified: Secondary | ICD-10-CM

## 2012-04-28 DIAGNOSIS — R0602 Shortness of breath: Secondary | ICD-10-CM | POA: Insufficient documentation

## 2012-04-28 DIAGNOSIS — C189 Malignant neoplasm of colon, unspecified: Secondary | ICD-10-CM

## 2012-04-28 DIAGNOSIS — D649 Anemia, unspecified: Secondary | ICD-10-CM

## 2012-04-28 DIAGNOSIS — D72829 Elevated white blood cell count, unspecified: Secondary | ICD-10-CM

## 2012-04-28 DIAGNOSIS — Z1231 Encounter for screening mammogram for malignant neoplasm of breast: Secondary | ICD-10-CM

## 2012-04-28 HISTORY — DX: Elevated white blood cell count, unspecified: D72.829

## 2012-04-28 LAB — CBC WITH DIFFERENTIAL/PLATELET
BASO%: 0.1 % (ref 0.0–2.0)
Basophils Absolute: 0 10*3/uL (ref 0.0–0.1)
EOS%: 0.6 % (ref 0.0–7.0)
Eosinophils Absolute: 0.1 10*3/uL (ref 0.0–0.5)
HCT: 38.4 % (ref 34.8–46.6)
HGB: 12.5 g/dL (ref 11.6–15.9)
LYMPH%: 20.6 % (ref 14.0–49.7)
MCH: 27.2 pg (ref 25.1–34.0)
MCHC: 32.6 g/dL (ref 31.5–36.0)
MCV: 83.5 fL (ref 79.5–101.0)
MONO#: 0.6 10*3/uL (ref 0.1–0.9)
MONO%: 4.4 % (ref 0.0–14.0)
NEUT#: 10.2 10*3/uL — ABNORMAL HIGH (ref 1.5–6.5)
NEUT%: 74.3 % (ref 38.4–76.8)
Platelets: 253 10*3/uL (ref 145–400)
RBC: 4.6 10*6/uL (ref 3.70–5.45)
RDW: 14.4 % (ref 11.2–14.5)
WBC: 13.8 10*3/uL — ABNORMAL HIGH (ref 3.9–10.3)
lymph#: 2.8 10*3/uL (ref 0.9–3.3)

## 2012-04-28 LAB — MORPHOLOGY: PLT EST: ADEQUATE

## 2012-04-28 IMAGING — CR DG CHEST 2V
2 series · 2 of 2 positions shown · non-contrast
Comparison: [DATE]

CLINICAL DATA: Shortness of breath, chest pain.

CHEST - 2 VIEW

[w chest pa]
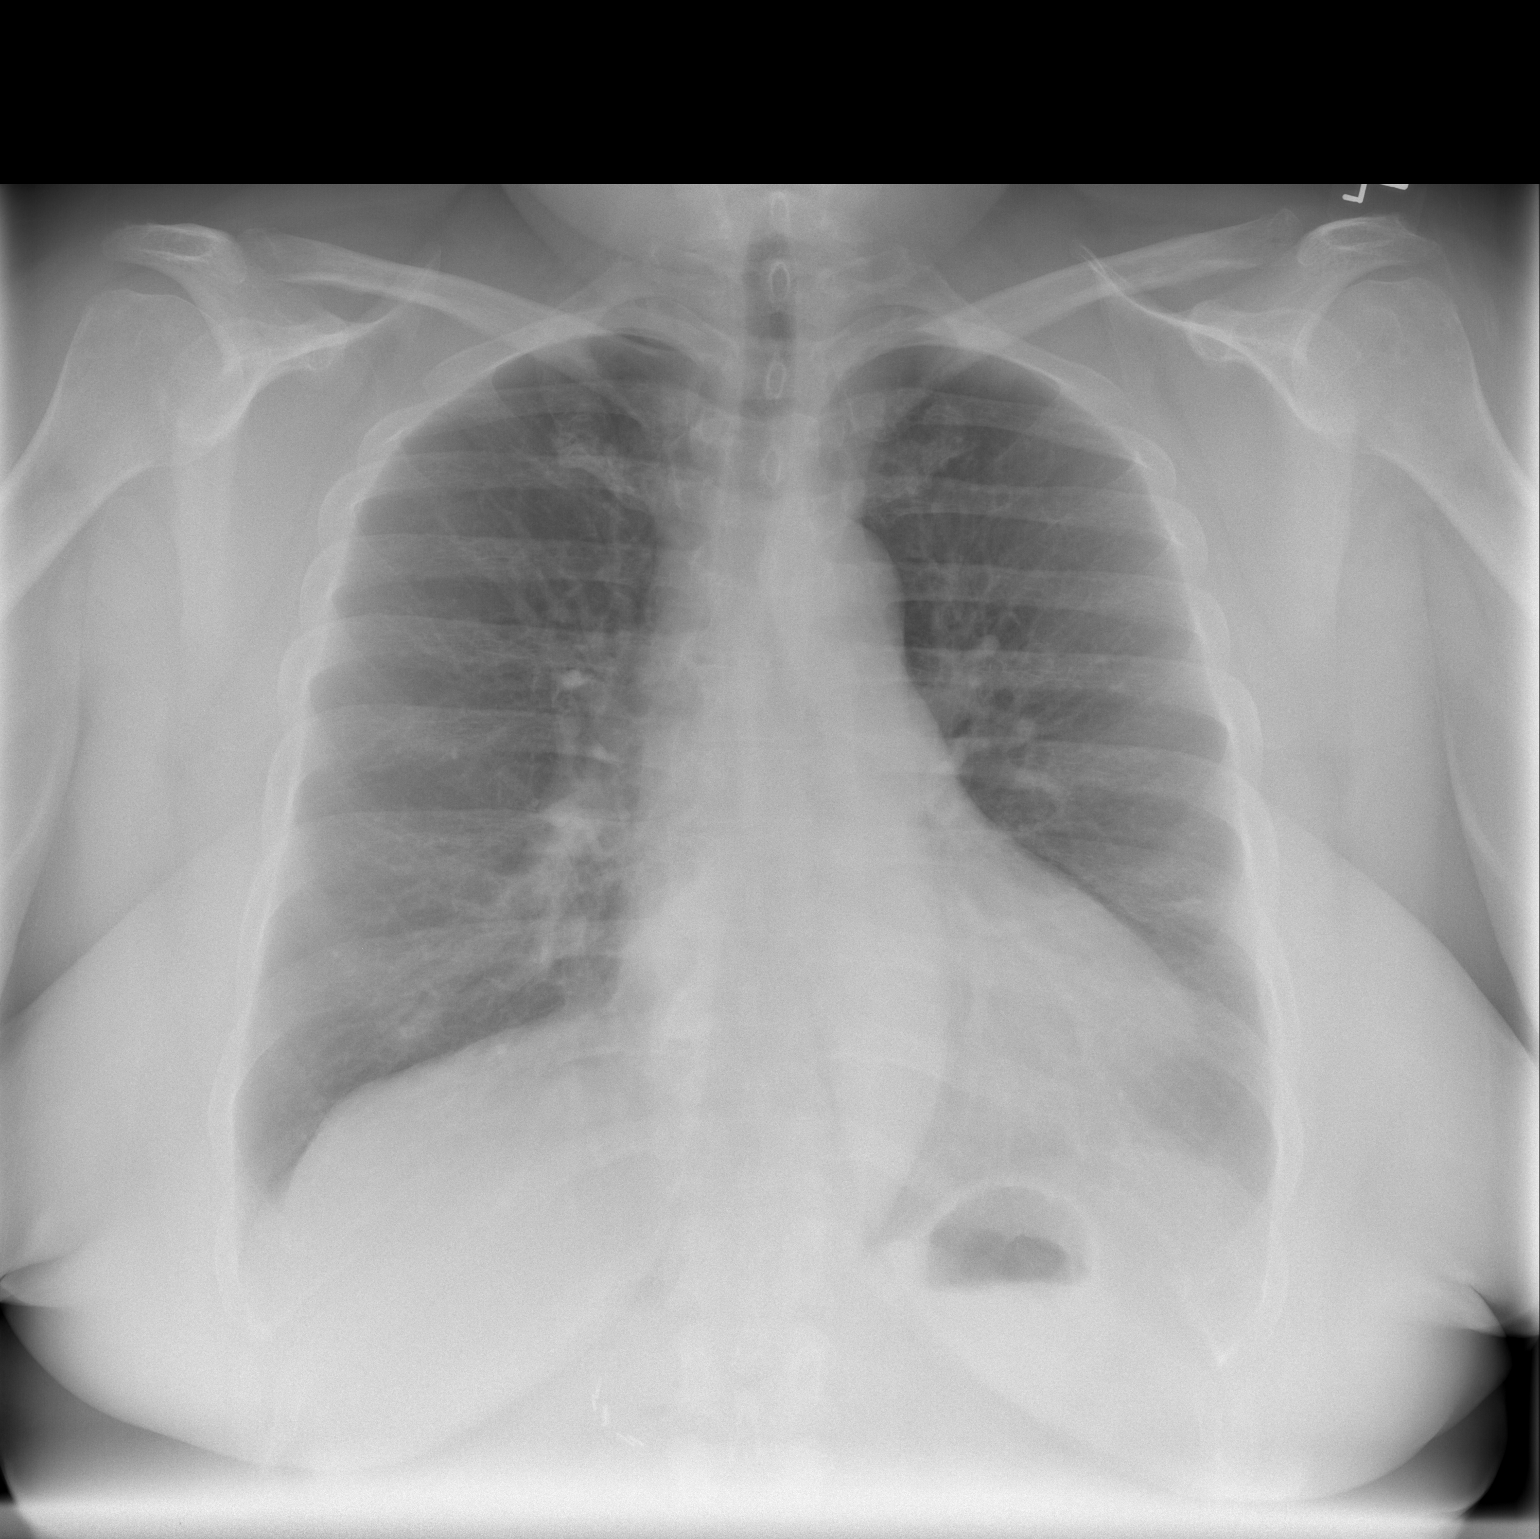

[w chest lat]
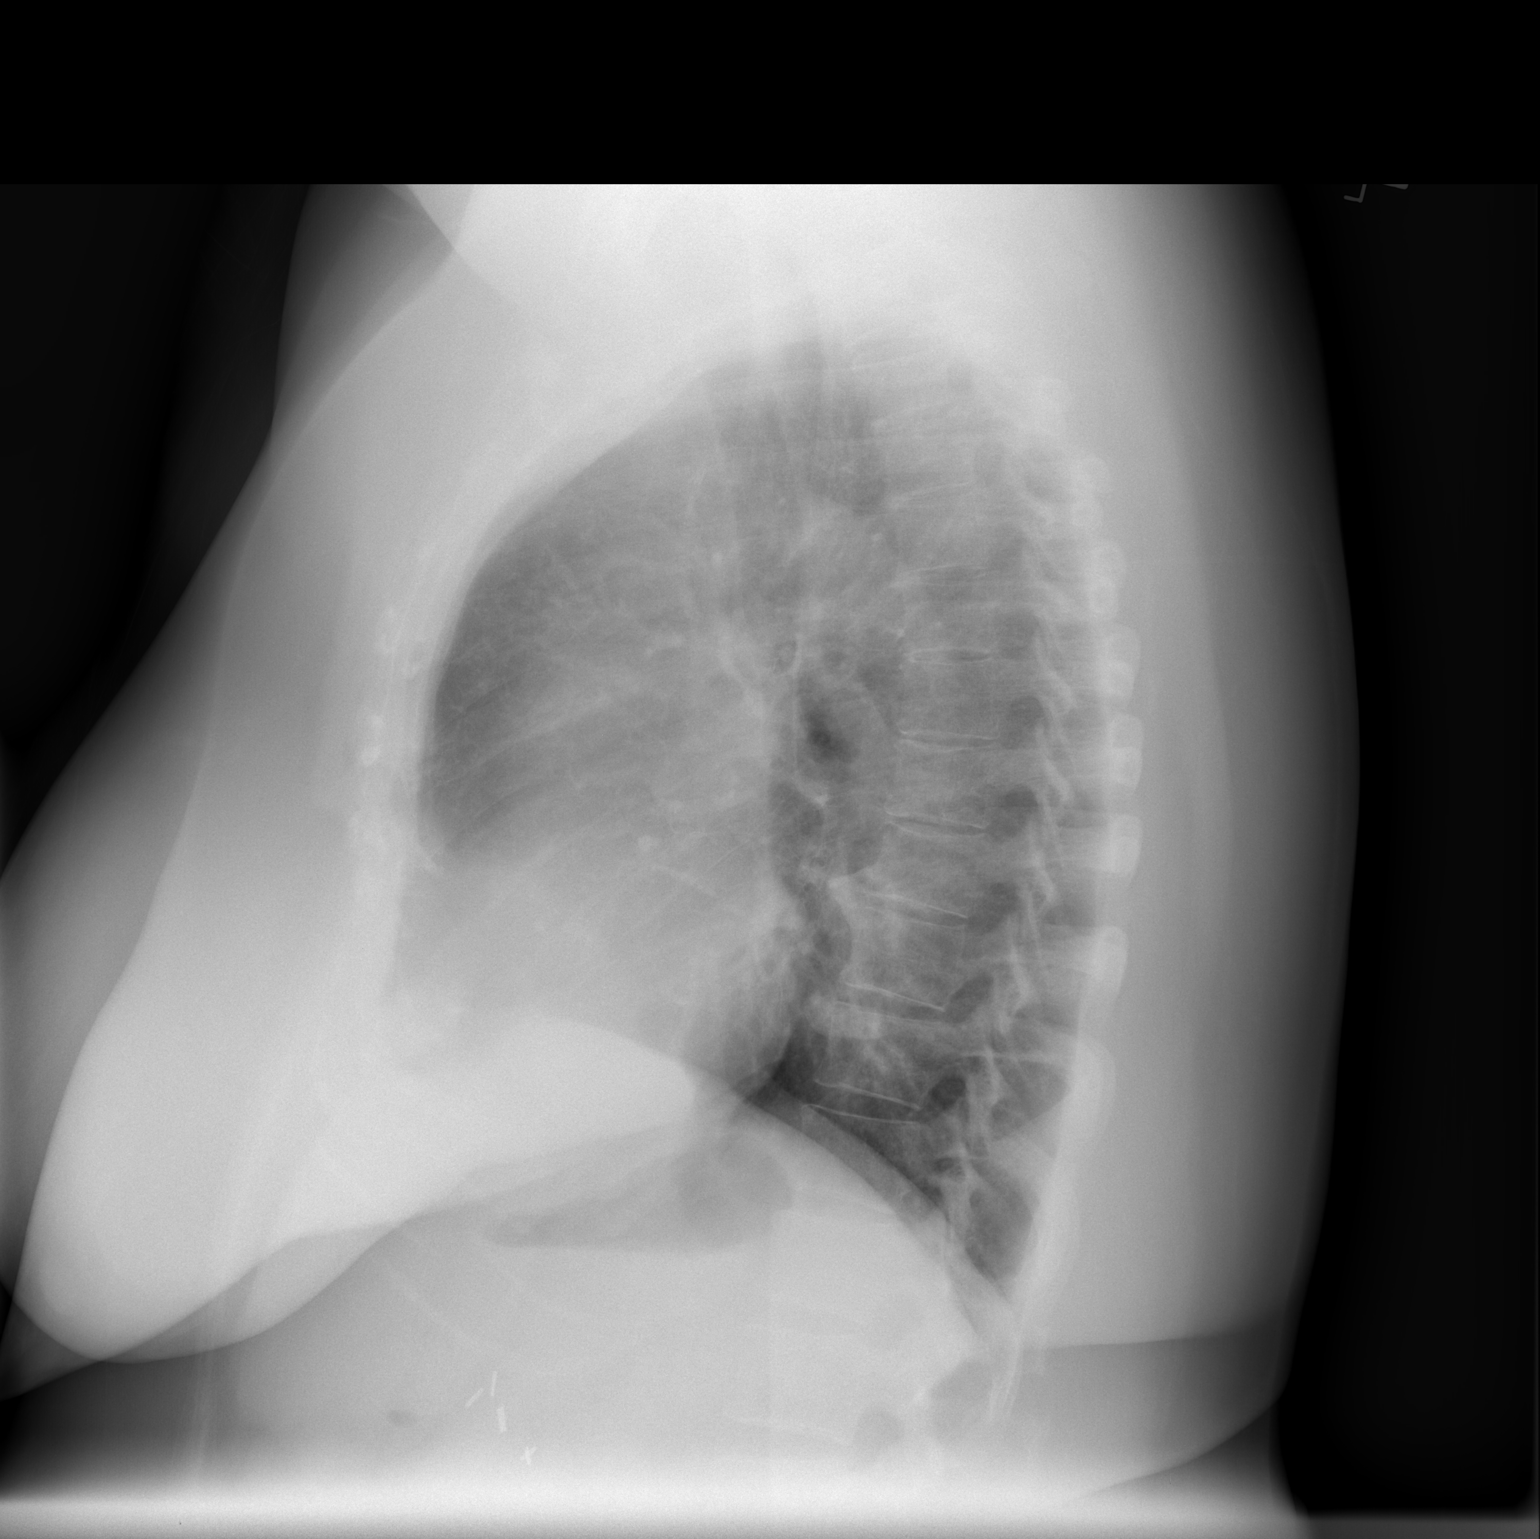

[2 of 2 positions shown; findings below may reference images not displayed]

FINDINGS: Heart is borderline in size.  Lungs are clear.  No
effusions.  No acute bony abnormality.
IMPRESSION: Borderline heart size.  No acute findings.

## 2012-04-28 NOTE — Progress Notes (Signed)
Primary Care- [facility]- Janne Napoleon, NP  Dr. Edrick Oh- [facility]

## 2012-04-28 NOTE — Progress Notes (Signed)
CC:   Whitney Walker Cancer, M.D. Whitney Walker Done, M.D. Whitney Salisbury, MD, Whitney Walker, M.D.  REFERRING PHYSICIAN:  Sherril Walker, M.D.  IDENTIFYING STATEMENT:  The patient is a 56 year old woman seen at request of Dr. Edrick Walker with leukocytosis.  HISTORY OF PRESENT ILLNESS:  The patient has a remote history for colon cancer and initially diagnosed in [place], [place].  She underwent a colectomy.  She states that she did not require chemotherapy.  I do not have any of those records to review at this time.  She subsequently moved down to the [place] area and was seen by Dr. Collene Walker with diverticular disease.  Her last colonoscopy in 2009 confirmed diverticular disease and she underwent a colectomy with Dr. Jackolyn Walker.  She also has history of chronic kidney disease stage 3 associated with anemia of chronic disease.  Lab data shown in Epic notes a CBC on 12/09/2009 showed a white cell count of 13.1, hemoglobin 10.6, hematocrit 32.1, platelets 203.  I do not have additional labs from Dr. Cherlyn Walker office.  She denies history of infections, specifically upper respiratory tract infections.  She has minimally lost weight.  She notes short bowel syndrome, especially diarrhea, for which she takes Imodium. She has no fever, chills, night sweats.  She has not noted adenopathy. She denies skin boils.  She is a long-term smoker.  MEDICATIONS:  Norvasc 10 mg daily, HydroDIURIL 25 mg daily, Paxil 20 mg daily, Requip 0.5 mg q.h.s.  ALLERGIES:  Iohexol.  SOCIAL HISTORY:  The patient is a long-term smoker, smoking on average 1 pack a day for 46 years.  She had a previous history of alcohol use but has decreased intake in the last couple of years.  She is single with 3 children.  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  PAST MEDICAL HISTORY: 1. Hypertension. 2. Stage 3 chronic kidney disease. 3. History of colon cancer diagnosed in 2008 in [place],  [place], status post surgery. 4. History of diverticular disease status post colectomy. 5. History of restless legs syndrome. 6. History of osteoarthritis.  FAMILY HISTORY:  [name] mother had breast cancer at age 13.  Father had prostate cancer.  HEALTH MAINTENANCE:  She last received a mammogram 3 years ago.  Her colonoscopy was way back to 2009.  REVIEW OF SYSTEMS:  Denies fever, chills, night sweats, anorexia, weight loss.  Cardiovascular:  Denies chest pain, PND, orthopnea, ankle swelling.  Respiratory:  Admits to a chronic cough with shortness of breath on extreme exertion.  Denies hemoptysis or wheezing.  Skin:  No bruising or bleeding.  Neurologic:  Denies headaches, vision change, extremity weakness.  GI:  Denies nausea, vomiting, abdominal pain. Denies rectal bleeding.  Admits to loose stools.  Rest review of systems negative.  PHYSICAL EXAM:  General:  The patient is alert and oriented x3.  Vitals: Pulse 82, blood pressure 146/92, temperature 97.3, respirations 18, weight is 227 pounds.  HEENT:  Head is atraumatic, normocephalic. Sclerae anicteric.  Mouth moist.  Chest:  Clear.  Abdomen:  Obese soft, nontender.  Bowel sounds present.  Extremities:  No calf tenderness.  No edema.  Lymph nodes:  No palpable cervical, axillary, or inguinal adenopathy.  CNS:  Nonfocal.  IMPRESSION AND PLAN:  Whitney Walker is a 56 year old woman referred with leukocytosis and anemia.  She has a background history of colon cancer status post colectomy, history of diverticular disease, chronic kidney disease.  She also has a history of  chronic tobacco use.  She has tried to quit.  We discussed smoking cessation classes but she declined.  We discussed potential etiology for slightly elevated white cell count.  We will investigate further with a CBC with differential, review a smear,  obtain a CMET and CEA level. We will obtain an LDH and obtain a  alkaline phosphatase,JAK2 mutation and  BCR/ABL to rule out a myeloproliferative disorder.  We will also obtain an SPEP with [facility].  In regards to anemia,  we will have her obtain iron studies.  She also agreed to have chest x-ray.   She returns to discuss results.    ______________________________ Whitney Walker, M.D. LIO/MEDQ  D:  04/28/2012  T:  04/28/2012  Job:  [id number]

## 2012-04-28 NOTE — Progress Notes (Signed)
Patient came in today as new patient, patient had two insurances, at this time patient did not think she would need assistance but if she does she will contact me. Gave patient my information.

## 2012-04-28 NOTE — Patient Instructions (Signed)
Patient to follow up as instructed.   No current outpatient prescriptions on file.        April 2013  Sunday Monday Tuesday Wednesday Thursday Friday Saturday      1   2   3   4   5   6    7   8   9   10   11   12   13    14   15   16   17   18   19   20    21   22   23   24   25   26   27    28   29   30    FINANCIAL COUNSELING  10:00 AM  (30 min.)  [employer], community  [facility]   NEW PATIENT 59  10:30 AM  (60 min.)  Nira Retort, MD  [place]

## 2012-04-28 NOTE — Telephone Encounter (Signed)
Gave pt appt calendar appt for May 23 10 am

## 2012-04-28 NOTE — Telephone Encounter (Signed)
Tried to make a mammogram appt for pt, based on the POF  and she got upset, she informed me that MD told her she can do it herself, [facility] and shtold her how to get to [facility] for x-ray.

## 2012-04-28 NOTE — Progress Notes (Signed)
This office note has been dictated.

## 2012-04-29 ENCOUNTER — Other Ambulatory Visit: Payer: Self-pay | Admitting: Hematology and Oncology

## 2012-04-29 ENCOUNTER — Other Ambulatory Visit: Payer: Self-pay | Admitting: *Deleted

## 2012-04-29 DIAGNOSIS — C189 Malignant neoplasm of colon, unspecified: Secondary | ICD-10-CM

## 2012-04-30 ENCOUNTER — Telehealth: Payer: Self-pay | Admitting: *Deleted

## 2012-04-30 ENCOUNTER — Telehealth: Payer: Self-pay | Admitting: Hematology and Oncology

## 2012-04-30 LAB — FERRITIN: Ferritin: 181 ng/mL (ref 10–291)

## 2012-04-30 LAB — SPEP & IFE WITH QIG
Albumin ELP: 44.7 % — ABNORMAL LOW (ref 55.8–66.1)
Alpha-1-Globulin: 4.6 % (ref 2.9–4.9)
Alpha-2-Globulin: 12.8 % — ABNORMAL HIGH (ref 7.1–11.8)
Beta 2: 9 % — ABNORMAL HIGH (ref 3.2–6.5)
Beta Globulin: 7.1 % (ref 4.7–7.2)
Gamma Globulin: 21.8 % — ABNORMAL HIGH (ref 11.1–18.8)
IgA: 745 mg/dL — ABNORMAL HIGH (ref 69–380)
IgG (Immunoglobin G), Serum: 2000 mg/dL — ABNORMAL HIGH (ref 690–1700)
IgM, Serum: 61 mg/dL (ref 52–322)
Total Protein, Serum Electrophoresis: 7.5 g/dL (ref 6.0–8.3)

## 2012-04-30 LAB — COMPREHENSIVE METABOLIC PANEL
ALT: 30 U/L (ref 0–35)
AST: 24 U/L (ref 0–37)
Albumin: 3.7 g/dL (ref 3.5–5.2)
Alkaline Phosphatase: 97 U/L (ref 39–117)
BUN: 22 mg/dL (ref 6–23)
CO2: 20 mEq/L (ref 19–32)
Calcium: 8.5 mg/dL (ref 8.4–10.5)
Chloride: 109 mEq/L (ref 96–112)
Creatinine, Ser: 1.58 mg/dL — ABNORMAL HIGH (ref 0.50–1.10)
Glucose, Bld: 106 mg/dL — ABNORMAL HIGH (ref 70–99)
Potassium: 4.3 mEq/L (ref 3.5–5.3)
Sodium: 138 mEq/L (ref 135–145)
Total Bilirubin: 0.4 mg/dL (ref 0.3–1.2)
Total Protein: 7.5 g/dL (ref 6.0–8.3)

## 2012-04-30 LAB — IRON AND TIBC
%SAT: 25 % (ref 20–55)
Iron: 78 ug/dL (ref 42–145)
TIBC: 313 ug/dL (ref 250–470)
UIBC: 235 ug/dL (ref 125–400)

## 2012-04-30 LAB — LACTATE DEHYDROGENASE: LDH: 165 U/L (ref 94–250)

## 2012-04-30 LAB — CEA: CEA: 5.8 ng/mL — ABNORMAL HIGH (ref 0.0–5.0)

## 2012-04-30 LAB — LEUKOCYTE ALKALINE PHOSPHATASE: Leukocyte Alkaline Phos Stain: 177 — ABNORMAL HIGH (ref 30–140)

## 2012-04-30 NOTE — Telephone Encounter (Signed)
Not able to reach pt @ home #. [email or web address] for pt re ct appt for 5/20 @ [facility] @ 12 pm. Also confirmed 5/23 f/u. New may schedule/referral mailed today.

## 2012-04-30 NOTE — Telephone Encounter (Signed)
Attempted to call pt on home phone  On 5/1 and again 5/2 unsuccessfully.   Called pt on cell phone x 1  5/1 and again  5/2.   Left messaage for pt to call nurse back to inform her:  Pt needs to do Abdomen CT prior to next visit as per md's instructions.   Left message on voice mail for pt to call nurse back.

## 2012-05-01 ENCOUNTER — Telehealth: Payer: Self-pay | Admitting: *Deleted

## 2012-05-01 LAB — BCR/ABL (LIO MMD)

## 2012-05-01 LAB — JAK2 GENOTYPR

## 2012-05-01 NOTE — Telephone Encounter (Signed)
Attempted to call pt on home phone - phone ringing but no voice mail available.   Called pt on cell phone and left message on voice mail again for pt to call nurse back.

## 2012-05-04 ENCOUNTER — Telehealth: Payer: Self-pay | Admitting: *Deleted

## 2012-05-04 NOTE — Telephone Encounter (Signed)
Patient called reporting she has been away, did not have the phone our office has been calling her and the entirety of messages weren't clear.  Informed her of appointment for CT abd. on 05-18-12 and f/u on 05-21-12.  States she needs to know where to pick up contrast.  Plans to get her early to drink contrast at 10:00 and 11:00 am.  Asked that she call RT to confirm times to drink contrast and the number given.  She asked what number does PART need to call to confirm appts. For transportation.  Instructed to call [phone] before appt. date and ask an Mining engineer, a nurse or scheduler to confirm the appt.  They also may try the Radiology dept.

## 2012-05-12 ENCOUNTER — Ambulatory Visit
Admission: RE | Admit: 2012-05-12 | Discharge: 2012-05-12 | Disposition: A | Discharge status: Home / Self Care | Payer: Medicare Other | Source: Ambulatory Visit | Attending: Hematology and Oncology | Admitting: Hematology and Oncology

## 2012-05-12 DIAGNOSIS — D539 Nutritional anemia, unspecified: Secondary | ICD-10-CM

## 2012-05-12 DIAGNOSIS — Z1231 Encounter for screening mammogram for malignant neoplasm of breast: Secondary | ICD-10-CM | POA: Diagnosis not present

## 2012-05-12 DIAGNOSIS — C189 Malignant neoplasm of colon, unspecified: Secondary | ICD-10-CM

## 2012-05-12 DIAGNOSIS — D72829 Elevated white blood cell count, unspecified: Secondary | ICD-10-CM

## 2012-05-18 ENCOUNTER — Other Ambulatory Visit: Payer: Self-pay | Admitting: Hematology and Oncology

## 2012-05-18 ENCOUNTER — Telehealth: Payer: Self-pay | Admitting: *Deleted

## 2012-05-18 ENCOUNTER — Ambulatory Visit (HOSPITAL_COMMUNITY)
Admission: RE | Admit: 2012-05-18 | Discharge: 2012-05-18 | Disposition: A | Discharge status: Home / Self Care | Payer: Medicare Other | Source: Ambulatory Visit | Attending: Hematology and Oncology | Admitting: Hematology and Oncology

## 2012-05-18 DIAGNOSIS — C189 Malignant neoplasm of colon, unspecified: Secondary | ICD-10-CM | POA: Insufficient documentation

## 2012-05-18 DIAGNOSIS — Z9049 Acquired absence of other specified parts of digestive tract: Secondary | ICD-10-CM | POA: Insufficient documentation

## 2012-05-18 DIAGNOSIS — J984 Other disorders of lung: Secondary | ICD-10-CM | POA: Insufficient documentation

## 2012-05-18 DIAGNOSIS — R109 Unspecified abdominal pain: Secondary | ICD-10-CM | POA: Diagnosis not present

## 2012-05-18 DIAGNOSIS — I319 Disease of pericardium, unspecified: Secondary | ICD-10-CM | POA: Diagnosis not present

## 2012-05-18 IMAGING — CT CT ABD-PELV W/O CM
2 of 4 series · 17 of 46 positions shown, 19 images · IV contrast (APPLIED)
Comparison: [DATE].

CLINICAL DATA: Colon cancer.  Partial colectomy.  Lower abdominal
pain.

CT ABDOMEN AND PELVIS WITHOUT CONTRAST
TECHNIQUE: Multidetector CT imaging of the abdomen and pelvis was
performed following the standard protocol without intravenous
contrast.

[Series 2: rtn a/p with · axial · 0.77mm/px · z∈[+975,+1355]mm · 14 of 84 slices shown, 16 images]
[im 4/84  soft-tissue]
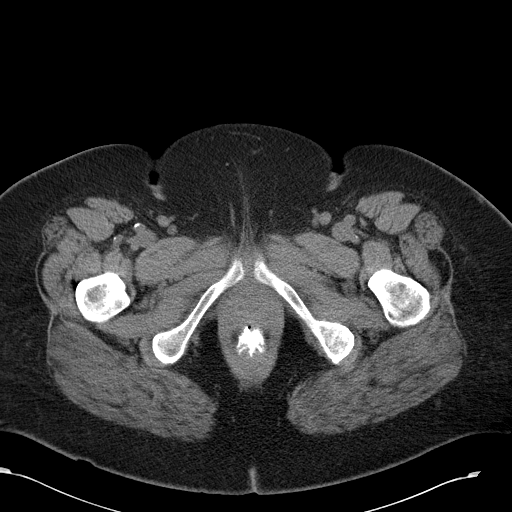
[im 4/84  bone]
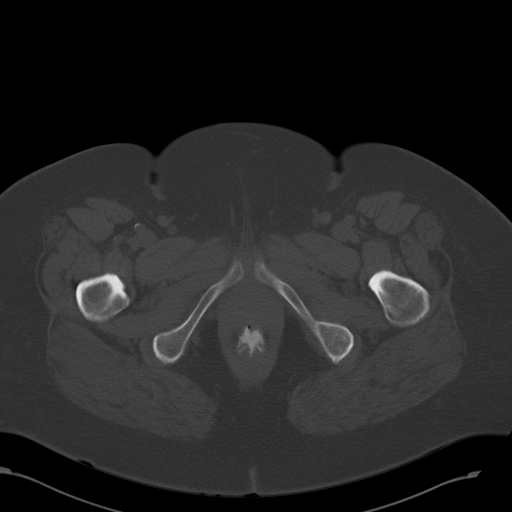
[im 11/84  soft-tissue]
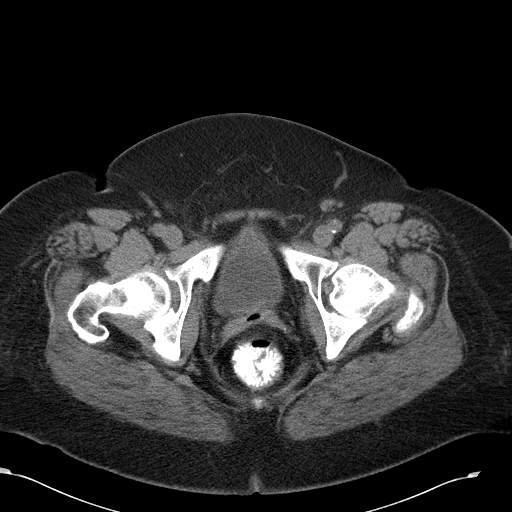
[im 15/84  soft-tissue]
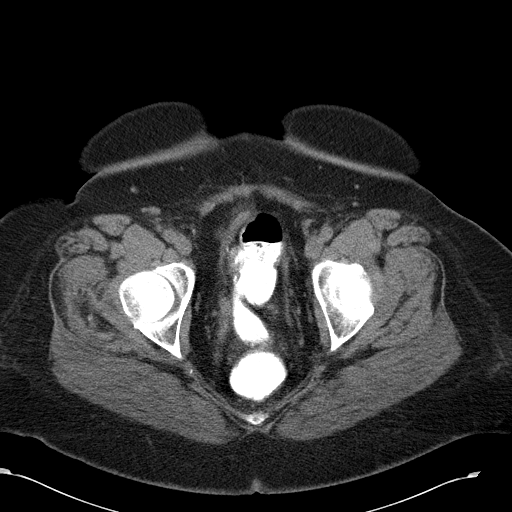
[im 22/84  soft-tissue]
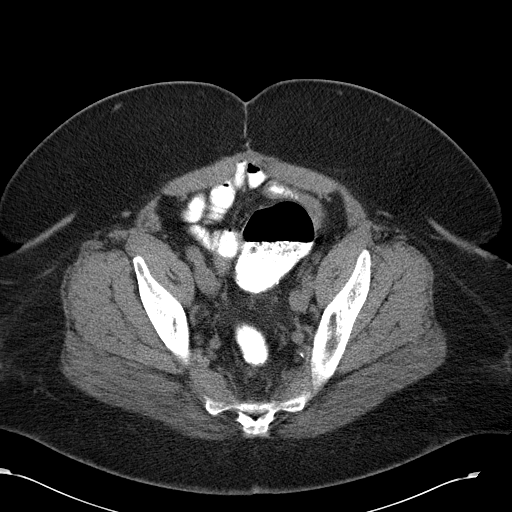
[im 29/84  soft-tissue]
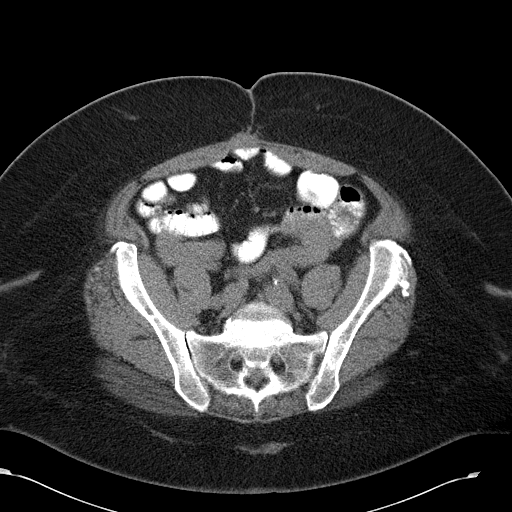
[im 33/84  soft-tissue]
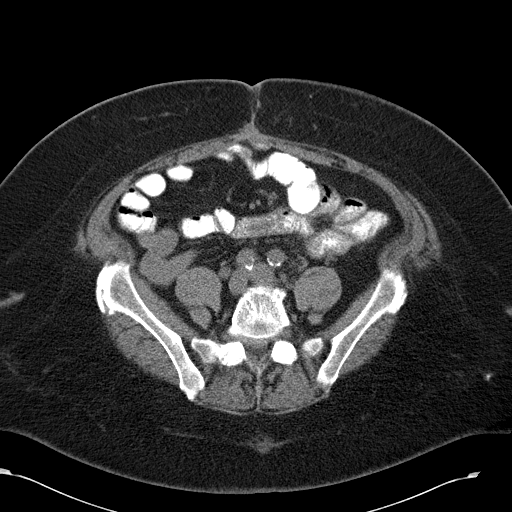
[im 40/84  soft-tissue]
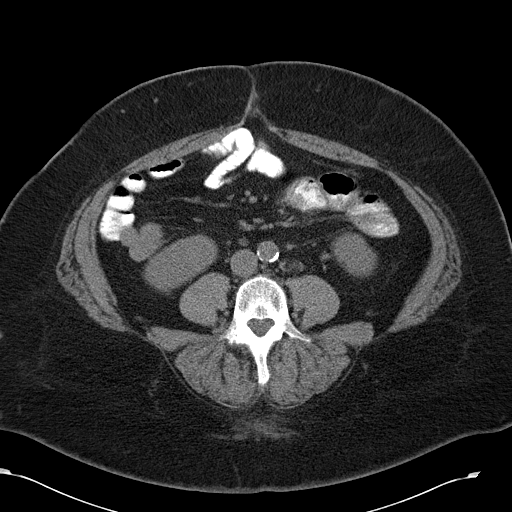
[im 44/84  soft-tissue]
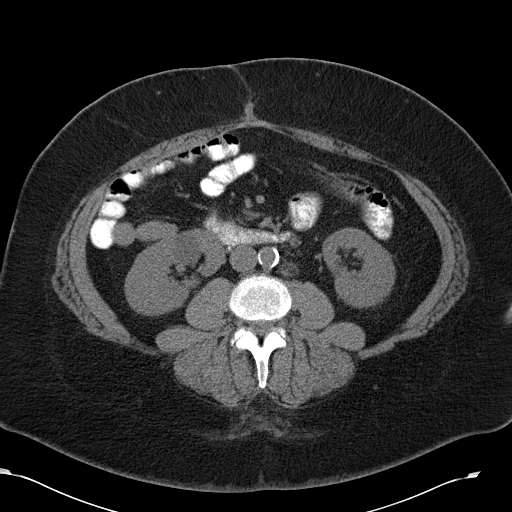
[im 51/84  soft-tissue]
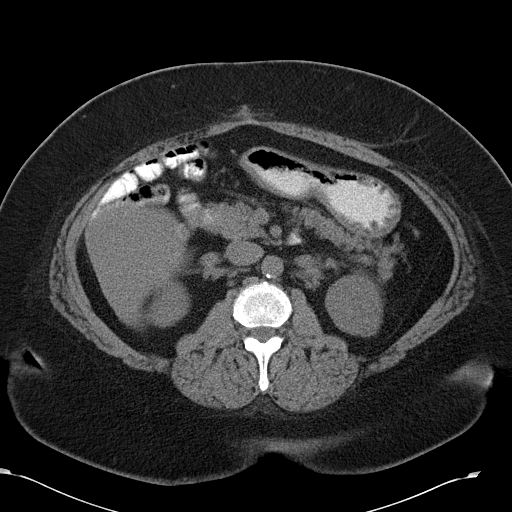
[im 51/84  bone]
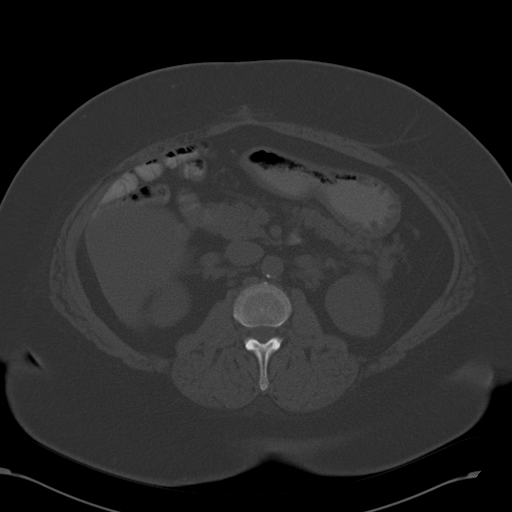
[im 55/84  soft-tissue]
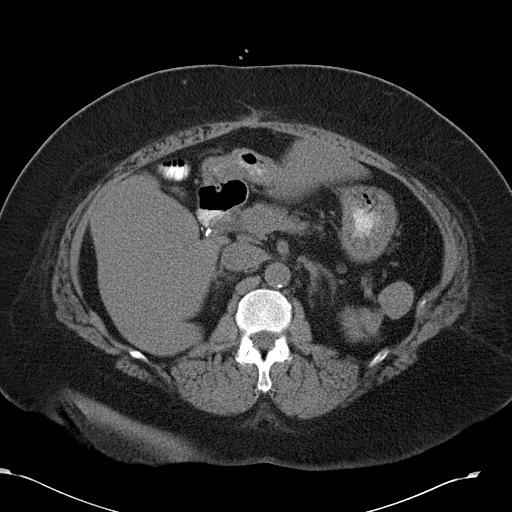
[im 62/84  soft-tissue]
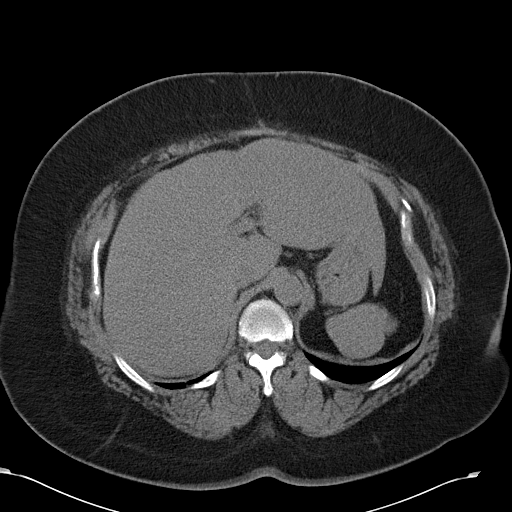
[im 69/84  soft-tissue]
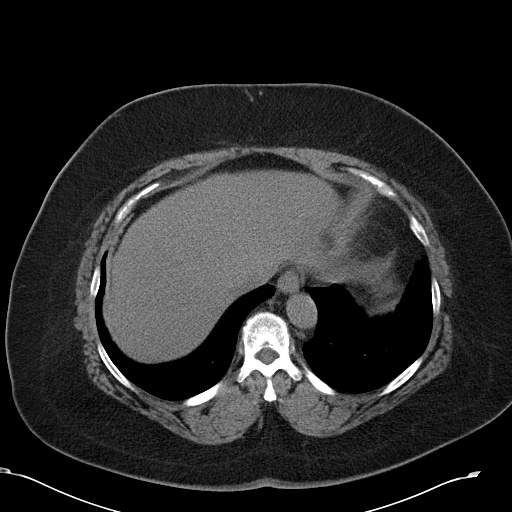
[im 73/84  soft-tissue]
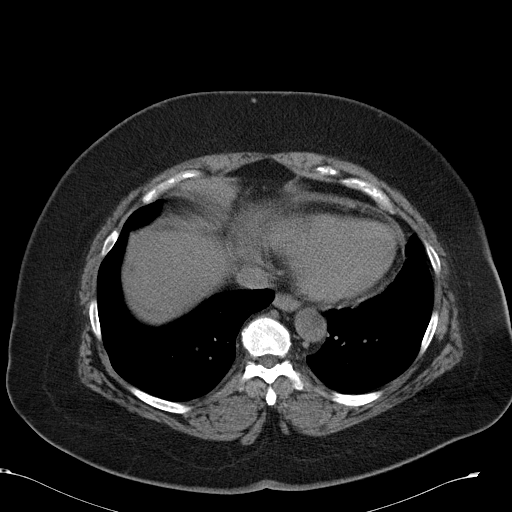
[im 80/84  soft-tissue]
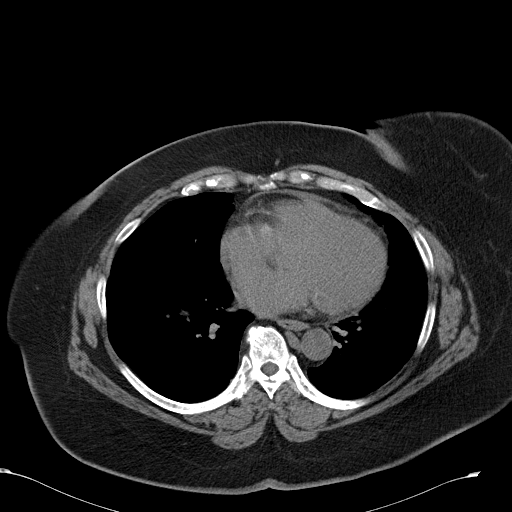

[Series 602: <mpr thick range> · coronal · 0.82mm/px · 3 of 94 slices shown]
[im 32/94  soft-tissue]
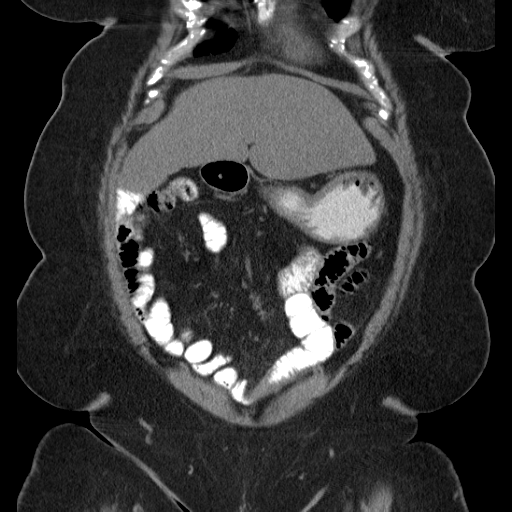
[im 42/94  soft-tissue]
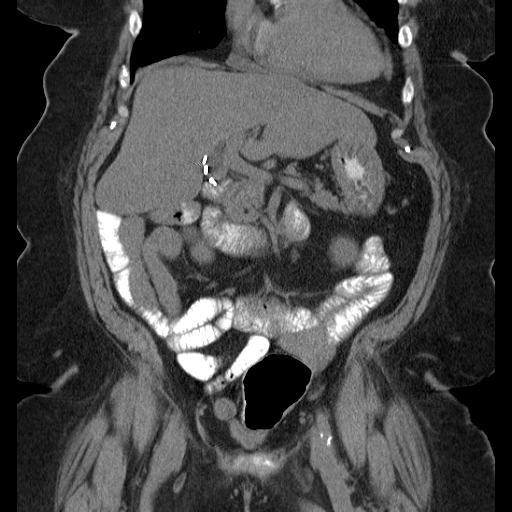
[im 52/94  soft-tissue]
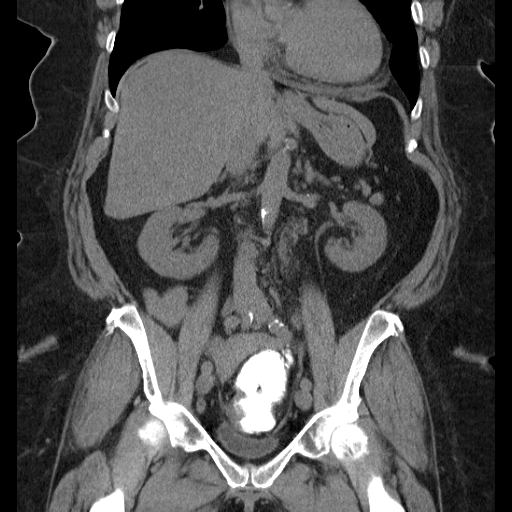

[17 of 46 positions shown; findings below may reference images not displayed]

FINDINGS: Lung bases show an irregular airspace density in the
medial aspect of the left lower lobe (image 20).  Heart is at the
upper limits of normal in size.  Small pericardial effusion.

Liver margin appears mildly irregular.  Cholecystectomy.  Low
attenuation lesions in the kidneys measure up to 2.5 cm on the
right.  Splenic irregularity is unchanged.  Pancreas, stomach and
small bowel are unremarkable.  Sub total colectomy.

No pathologically enlarged lymph nodes.  Atherosclerotic
calcification of the arterial vasculature without abdominal aortic
aneurysm.  No free fluid.  Postoperative scarring in the ventral
abdominal wall.  No worrisome lytic or sclerotic lesions.
IMPRESSION: 1.  No evidence of recurrent or metastatic disease.  No findings to
explain the patient's abdominal pain.
2.  Irregular airspace density in the subpleural medial left lower
lobe, possibly infectious or inflammatory in etiology.  Follow-up
in 3 months could be performed to ensure resolution.
3.  Small pericardial effusion.
4.  Liver margin appears mildly irregular, indicative of cirrhosis.

## 2012-05-18 NOTE — Telephone Encounter (Signed)
PT.'S ABDOMEN AND PELVIS CT WAS ORDERED WITH IV CONTRAST. SHE WAS NOT PREMEDICATED. PT. IS IN RADIOLOGY NOW DRINKING THE ORAL CONTRAST. SHE DOES NOT WANT TO BE RESCHEDULED WITH THE 65 HOUR PREP SO THE CT SCAN WILL BE DONE WITHOUT IV CONTRAST. NOTIFIED DR.ODOGWU'S NURSE, THU BRAY,RN.

## 2012-05-21 ENCOUNTER — Encounter: Payer: Self-pay | Admitting: Hematology and Oncology

## 2012-05-21 ENCOUNTER — Ambulatory Visit (HOSPITAL_BASED_OUTPATIENT_CLINIC_OR_DEPARTMENT_OTHER): Payer: Medicare Other | Admitting: Lab

## 2012-05-21 ENCOUNTER — Ambulatory Visit (HOSPITAL_BASED_OUTPATIENT_CLINIC_OR_DEPARTMENT_OTHER): Payer: Medicare Other | Admitting: Hematology and Oncology

## 2012-05-21 ENCOUNTER — Telehealth: Payer: Self-pay | Admitting: Hematology and Oncology

## 2012-05-21 VITALS — BP 192/95 | HR 94 | Temp 98.8°F | Ht 62.0 in | Wt 231.2 lb

## 2012-05-21 DIAGNOSIS — Z85038 Personal history of other malignant neoplasm of large intestine: Secondary | ICD-10-CM

## 2012-05-21 DIAGNOSIS — D72829 Elevated white blood cell count, unspecified: Secondary | ICD-10-CM

## 2012-05-21 DIAGNOSIS — K7460 Unspecified cirrhosis of liver: Secondary | ICD-10-CM

## 2012-05-21 DIAGNOSIS — C189 Malignant neoplasm of colon, unspecified: Secondary | ICD-10-CM

## 2012-05-21 DIAGNOSIS — F17200 Nicotine dependence, unspecified, uncomplicated: Secondary | ICD-10-CM

## 2012-05-21 NOTE — Telephone Encounter (Signed)
Pt left before getting all appts due to going back to the lab and her transport.  Did state that she cannot do labs prior and has to be same day due to transport.  Dr Collene Mares can see the pt on 6/6 at 2:00 and they will give a reminder call as well to the pt.  Will mail all appts to pt   aom

## 2012-05-21 NOTE — Progress Notes (Signed)
CC:   Robyn N. Baird Cancer, M.D. Nelwyn Salisbury, MD, [name], M.D. Sherril Croon, M.D.  IDENTIFYING STATEMENT:  The patient is a 56 year old woman who presents to discuss results.  INTERVAL HISTORY:  The patient was referred with a mild leukocytosis. She has a history for diverticular disease, status post colectomy.  She also has a history of cancer diagnosed in [place], [place], and underwent a colectomy.  Her last colonoscopy with Dr. Collene Mares was in 2009. She has also has history of chronic stage 3 kidney disease.  Workup notes the following:  CEA was slightly elevated at 5.8 and as result of this, the patient received a CT scan of the abdomen and pelvis on 04/29/2012.  This revealed no evidence of recurrent or metastatic disease; however, the liver margin appeared mildly irregular and the radiologist felt that this indicated findings compatible with cirrhosis.  White cell count 13.8 (13.1 two years ago), hemoglobin 12.5, hematocrit 38.4, platelets 253.  Peripheral smear showed slight polychromasia with few target cells.  There were occasional large and giant platelets. Peripheral blood showed no evidence for JAK2 mutation.  BCR-ABL was not detected.  Ferritin was 181, iron 78, TIBC 313, saturation 25%. Alkaline phosphatase was 177.  Serum protein electrophoresis did not reveal any monoclonal spikes.  MEDICATIONS:  Reviewed and updated.  ALLERGIES:  Iohexol.  SOCIAL HISTORY:  She continues to be a long-term smoker, smoking an average of 1 pack a day for 46 years.  She also admits to a previous history of alcohol intake, which has decreased in the last couple of years.  FAMILY HISTORY:  Unchanged.  REVIEW OF SYSTEMS:  Ten-point review of systems negative.  PHYSICAL EXAMINATION:  Patient is alert, oriented x3.  Vitals:  Pulse 94, blood pressure 192/95, temperature 98.8, respirations 18, weight 231.2.  HEENT:  Head is atraumatic, normocephalic.  Sclerae  anicteric. Chest:  Clear.  CVS:  Unremarkable.  Abdomen:  Soft, nontender.  Bowel sounds present.  Extremities:  No calf tenderness.  LABORATORY DATA:  As above.  In addition, sodium 136, potassium 4.3, chloride 109, CO2 20, BUN 22, creatinine 1.58 (1.06.), t-bili 0.4, alkaline phosphatase 97, AST 24, ALT 30, calcium 8.5. Mammogram - unremarkable.  IMPRESSION/PLAN: 1. Ms. Goucher is a 56 year old woman referred for leukocytosis.     Subsequent workup noted mildly-elevated CEA.  She has had colon     cancer in the past, status post colectomy and did not receive      adjuvant therapy.  I recommend a colonoscopy, and thus referral to     Dr. [name] has been made. 2. Findings of radiographic evidence for cirrhosis.  The patient has     history of alcohol intake.  The patient was very upset with the CT     scan findings.  She was given a copy of the scan results.  I also     recommend she follow up with GI, specifically with Dr. Collene Mares, for     her input. 3. Leukocytosis, which appears chronic.  Etiology multifactorial,     specifically chronic tobacco use.  She has no evidence     myeloproliferative disorder or leukemia at this time.  However, I     think she requires ongoing followup and plan to follow her up in 6     months' time. Before she is discharged from the clinic, I will have her return to the lab for a hepatitis panel.  Again, the patient was not happy  with visit, and but hopefully she will follows up with recommendations.    ______________________________ Nira Retort, M.D. LIO/MEDQ  D:  05/21/2012  T:  05/21/2012  Job:  [id number]

## 2012-05-21 NOTE — Patient Instructions (Signed)
Whitney Walker  [id number]  [facility] Discharge Instructions  RECOMMENDATIONS MADE BY THE CONSULTANT AND ANY TEST RESULTS WILL BE SENT TO YOUR REFERRING DOCTOR.   EXAM FINDINGS BY MD TODAY AND SIGNS AND SYMPTOMS TO REPORT TO CLINIC OR PRIMARY MD:   Your current list of medications are: Current Outpatient Prescriptions  Medication Sig Dispense Refill  . AMLODIPINE BESYLATE PO Take 10 mg by mouth daily.      . hydrochlorothiazide (HYDRODIURIL) 25 MG tablet Take 25 mg by mouth daily.      [name] rOPINIRole (REQUIP) 0.5 MG tablet Take 0.5 mg by mouth at bedtime as needed.      [name] PAROXETINE HCL PO Take 20 mg by mouth daily.         INSTRUCTIONS GIVEN AND DISCUSSED:   SPECIAL INSTRUCTIONS/FOLLOW-UP:  See above.  I acknowledge that I have been informed and understand all the instructions given to me and received a copy. I do not have any more questions at this time, but understand that I may call the [facility] at [phone] during business hours should I have any further questions or need assistance in obtaining follow-up care.

## 2012-05-21 NOTE — Progress Notes (Signed)
This office note has been dictated.

## 2012-05-22 LAB — AFP TUMOR MARKER: AFP-Tumor Marker: 3.8 ng/mL (ref 0.0–8.0)

## 2012-05-22 LAB — HEPATITIS PANEL, ACUTE
HCV Ab: REACTIVE — AB
Hep A IgM: NEGATIVE
Hep B C IgM: NEGATIVE
Hepatitis B Surface Ag: NEGATIVE

## 2012-05-27 ENCOUNTER — Other Ambulatory Visit: Payer: Self-pay | Admitting: *Deleted

## 2012-05-27 ENCOUNTER — Telehealth: Payer: Self-pay | Admitting: Hematology and Oncology

## 2012-05-27 ENCOUNTER — Other Ambulatory Visit: Payer: Self-pay | Admitting: Hematology and Oncology

## 2012-05-27 DIAGNOSIS — K7460 Unspecified cirrhosis of liver: Secondary | ICD-10-CM

## 2012-05-27 DIAGNOSIS — R768 Other specified abnormal immunological findings in serum: Secondary | ICD-10-CM

## 2012-05-27 NOTE — Telephone Encounter (Signed)
lmonvm for pt on both cell/home phone to call me to schedule lb appt. No expected date attached to lb order. Per LO pt has transportation issues call pt and find out when she can come in for lb and schedule accordingly. Awaiting return call from pt.

## 2012-05-28 ENCOUNTER — Ambulatory Visit (HOSPITAL_BASED_OUTPATIENT_CLINIC_OR_DEPARTMENT_OTHER): Payer: Medicare Other | Admitting: Lab

## 2012-05-28 DIAGNOSIS — K7460 Unspecified cirrhosis of liver: Secondary | ICD-10-CM

## 2012-05-28 DIAGNOSIS — C189 Malignant neoplasm of colon, unspecified: Secondary | ICD-10-CM | POA: Diagnosis not present

## 2012-05-28 DIAGNOSIS — R768 Other specified abnormal immunological findings in serum: Secondary | ICD-10-CM

## 2012-05-28 DIAGNOSIS — D72829 Elevated white blood cell count, unspecified: Secondary | ICD-10-CM | POA: Diagnosis not present

## 2012-05-29 ENCOUNTER — Telehealth: Payer: Self-pay | Admitting: *Deleted

## 2012-05-29 DIAGNOSIS — K7460 Unspecified cirrhosis of liver: Secondary | ICD-10-CM | POA: Diagnosis not present

## 2012-05-29 LAB — HEPATITIS PANEL, ACUTE
HCV Ab: REACTIVE — AB
Hep A IgM: NEGATIVE
Hep B C IgM: NEGATIVE
Hepatitis B Surface Ag: NEGATIVE

## 2012-05-29 NOTE — Telephone Encounter (Signed)
patient came in on 05-28-2012 for the lab only appointment patient confirmed over the phone

## 2012-06-02 ENCOUNTER — Telehealth: Payer: Self-pay | Admitting: *Deleted

## 2012-06-02 LAB — HEPATITIS C VRS RNA DETECT BY PCR-QUAL: Hepatitis C Vrs RNA by PCR-Qual: NEGATIVE

## 2012-06-02 NOTE — Telephone Encounter (Signed)
Spoke with pt and informed pt re:  Hep C  Negative  As per md.   However,  Pt must still f/u with GI appt.   Pt stated she would keep appt on 06/04/12 as scheduled with Dr. Collene Mares.

## 2012-06-04 DIAGNOSIS — R198 Other specified symptoms and signs involving the digestive system and abdomen: Secondary | ICD-10-CM | POA: Diagnosis not present

## 2012-06-04 DIAGNOSIS — R7989 Other specified abnormal findings of blood chemistry: Secondary | ICD-10-CM | POA: Diagnosis not present

## 2012-06-04 DIAGNOSIS — Z1211 Encounter for screening for malignant neoplasm of colon: Secondary | ICD-10-CM | POA: Diagnosis not present

## 2012-06-04 DIAGNOSIS — Z85038 Personal history of other malignant neoplasm of large intestine: Secondary | ICD-10-CM | POA: Diagnosis not present

## 2012-06-05 DIAGNOSIS — R198 Other specified symptoms and signs involving the digestive system and abdomen: Secondary | ICD-10-CM | POA: Diagnosis not present

## 2012-06-05 DIAGNOSIS — Z85038 Personal history of other malignant neoplasm of large intestine: Secondary | ICD-10-CM | POA: Diagnosis not present

## 2012-06-05 DIAGNOSIS — Z1211 Encounter for screening for malignant neoplasm of colon: Secondary | ICD-10-CM | POA: Diagnosis not present

## 2012-06-05 DIAGNOSIS — R7989 Other specified abnormal findings of blood chemistry: Secondary | ICD-10-CM | POA: Diagnosis not present

## 2012-07-07 DIAGNOSIS — N185 Chronic kidney disease, stage 5: Secondary | ICD-10-CM | POA: Diagnosis not present

## 2012-07-07 DIAGNOSIS — N183 Chronic kidney disease, stage 3 unspecified: Secondary | ICD-10-CM | POA: Diagnosis not present

## 2012-07-07 DIAGNOSIS — I1311 Hypertensive heart and chronic kidney disease without heart failure, with stage 5 chronic kidney disease, or end stage renal disease: Secondary | ICD-10-CM | POA: Diagnosis not present

## 2012-07-07 DIAGNOSIS — R21 Rash and other nonspecific skin eruption: Secondary | ICD-10-CM | POA: Diagnosis not present

## 2012-07-07 DIAGNOSIS — R197 Diarrhea, unspecified: Secondary | ICD-10-CM | POA: Diagnosis not present

## 2012-07-07 DIAGNOSIS — R933 Abnormal findings on diagnostic imaging of other parts of digestive tract: Secondary | ICD-10-CM | POA: Diagnosis not present

## 2012-07-07 DIAGNOSIS — R198 Other specified symptoms and signs involving the digestive system and abdomen: Secondary | ICD-10-CM | POA: Diagnosis not present

## 2012-07-10 ENCOUNTER — Telehealth: Payer: Self-pay | Admitting: *Deleted

## 2012-07-10 NOTE — Telephone Encounter (Signed)
Received message from Dr. Collene Mares wanting to inform md re:   Dr. Collene Mares saw pt recently for cirrhosis issue.   Dr. Collene Mares had a long discussion with pt about this issue;  Pt  Has no desire of any workup studies at present.   Dr.  Collene Mares stated she would send office notes to Dr. Jamse Arn for review.     Dr.  Collene Mares would be available to answer further questions as needed.

## 2012-07-13 ENCOUNTER — Telehealth: Payer: Self-pay | Admitting: *Deleted

## 2012-07-13 NOTE — Telephone Encounter (Signed)
Spoke with Schayla @ Dr. Lorie Apley office and asked her to send office notes from Dr. Collene Mares to pt's  Primary  Dr.  Glendale Chard.   Schayla voiced understanding and stated she would do so.

## 2012-07-15 ENCOUNTER — Encounter: Payer: Self-pay | Admitting: Hematology and Oncology

## 2012-08-12 DIAGNOSIS — I1311 Hypertensive heart and chronic kidney disease without heart failure, with stage 5 chronic kidney disease, or end stage renal disease: Secondary | ICD-10-CM | POA: Diagnosis not present

## 2012-08-12 DIAGNOSIS — R7309 Other abnormal glucose: Secondary | ICD-10-CM | POA: Diagnosis not present

## 2012-08-12 DIAGNOSIS — N183 Chronic kidney disease, stage 3 unspecified: Secondary | ICD-10-CM | POA: Diagnosis not present

## 2012-08-12 DIAGNOSIS — G2581 Restless legs syndrome: Secondary | ICD-10-CM | POA: Diagnosis not present

## 2012-10-23 DIAGNOSIS — M79609 Pain in unspecified limb: Secondary | ICD-10-CM | POA: Diagnosis not present

## 2012-11-03 ENCOUNTER — Telehealth: Payer: Self-pay | Admitting: Hematology and Oncology

## 2012-11-03 NOTE — Telephone Encounter (Signed)
Moved 11/22 appt to 11/29 due to poof. lmonvm for pt on both home/cell and mailed schedule.

## 2012-11-13 ENCOUNTER — Telehealth: Payer: Self-pay | Admitting: Medical Oncology

## 2012-11-13 DIAGNOSIS — I10 Essential (primary) hypertension: Secondary | ICD-10-CM | POA: Diagnosis not present

## 2012-11-13 DIAGNOSIS — N183 Chronic kidney disease, stage 3 unspecified: Secondary | ICD-10-CM | POA: Diagnosis not present

## 2012-11-13 DIAGNOSIS — E1165 Type 2 diabetes mellitus with hyperglycemia: Secondary | ICD-10-CM | POA: Diagnosis not present

## 2012-11-13 DIAGNOSIS — M109 Gout, unspecified: Secondary | ICD-10-CM | POA: Diagnosis not present

## 2012-11-13 DIAGNOSIS — Z79899 Other long term (current) drug therapy: Secondary | ICD-10-CM | POA: Diagnosis not present

## 2012-11-13 DIAGNOSIS — E1129 Type 2 diabetes mellitus with other diabetic kidney complication: Secondary | ICD-10-CM | POA: Diagnosis not present

## 2012-11-13 DIAGNOSIS — F331 Major depressive disorder, recurrent, moderate: Secondary | ICD-10-CM | POA: Diagnosis not present

## 2012-11-13 DIAGNOSIS — E8881 Metabolic syndrome: Secondary | ICD-10-CM | POA: Diagnosis not present

## 2012-11-13 DIAGNOSIS — I1310 Hypertensive heart and chronic kidney disease without heart failure, with stage 1 through stage 4 chronic kidney disease, or unspecified chronic kidney disease: Secondary | ICD-10-CM | POA: Diagnosis not present

## 2012-11-13 DIAGNOSIS — R5383 Other fatigue: Secondary | ICD-10-CM | POA: Diagnosis not present

## 2012-11-13 DIAGNOSIS — E7800 Pure hypercholesterolemia, unspecified: Secondary | ICD-10-CM | POA: Diagnosis not present

## 2012-11-13 DIAGNOSIS — R5381 Other malaise: Secondary | ICD-10-CM | POA: Diagnosis not present

## 2012-11-13 NOTE — Telephone Encounter (Signed)
appt confirmed

## 2012-11-20 ENCOUNTER — Other Ambulatory Visit: Payer: Medicare Other | Admitting: Lab

## 2012-11-20 ENCOUNTER — Ambulatory Visit: Payer: Medicare Other | Admitting: Hematology and Oncology

## 2012-11-25 ENCOUNTER — Telehealth: Payer: Self-pay | Admitting: *Deleted

## 2012-11-25 NOTE — Telephone Encounter (Signed)
Pt called requesting cancellation of appts on 11/27/12 - due to transportation issue.   Pt requested a rescheduled appt.   Informed pt that a scheduler will contact pt with md's decision.    Pt's   Cell   Phone    [phone]   ;    Home   Phone     [phone].

## 2012-11-27 ENCOUNTER — Telehealth: Payer: Self-pay | Admitting: Hematology and Oncology

## 2012-11-27 ENCOUNTER — Ambulatory Visit: Payer: Medicare Other | Admitting: Hematology and Oncology

## 2012-11-27 ENCOUNTER — Other Ambulatory Visit: Payer: Medicare Other | Admitting: Lab

## 2012-11-27 ENCOUNTER — Other Ambulatory Visit: Payer: Self-pay | Admitting: *Deleted

## 2012-11-27 NOTE — Telephone Encounter (Signed)
Per Thu changed 12/6 lb/fu to 11 and 1115 am. S/wp pt re new appt time.

## 2012-11-27 NOTE — Telephone Encounter (Signed)
S/w pt re appt for 12/6 but pt has another appt conflict and needs an appt after 11 am. Message to desk nurse

## 2012-12-04 ENCOUNTER — Ambulatory Visit (HOSPITAL_BASED_OUTPATIENT_CLINIC_OR_DEPARTMENT_OTHER): Payer: Medicare Other | Admitting: Hematology and Oncology

## 2012-12-04 ENCOUNTER — Ambulatory Visit: Payer: Medicare Other | Admitting: Hematology and Oncology

## 2012-12-04 ENCOUNTER — Telehealth: Payer: Self-pay | Admitting: Hematology and Oncology

## 2012-12-04 ENCOUNTER — Other Ambulatory Visit: Payer: Medicare Other | Admitting: Lab

## 2012-12-04 ENCOUNTER — Encounter: Payer: Self-pay | Admitting: Hematology and Oncology

## 2012-12-04 VITALS — BP 157/84 | HR 77 | Temp 98.0°F | Resp 22 | Ht 62.0 in | Wt 238.9 lb

## 2012-12-04 DIAGNOSIS — C189 Malignant neoplasm of colon, unspecified: Secondary | ICD-10-CM | POA: Diagnosis not present

## 2012-12-04 DIAGNOSIS — K7460 Unspecified cirrhosis of liver: Secondary | ICD-10-CM

## 2012-12-04 DIAGNOSIS — D72829 Elevated white blood cell count, unspecified: Secondary | ICD-10-CM

## 2012-12-04 LAB — COMPREHENSIVE METABOLIC PANEL (CC13)
ALT: 30 U/L (ref 0–55)
AST: 23 U/L (ref 5–34)
Albumin: 2.9 g/dL — ABNORMAL LOW (ref 3.5–5.0)
Alkaline Phosphatase: 107 U/L (ref 40–150)
BUN: 22 mg/dL (ref 7.0–26.0)
CO2: 21 mEq/L — ABNORMAL LOW (ref 22–29)
Calcium: 9.3 mg/dL (ref 8.4–10.4)
Chloride: 111 mEq/L — ABNORMAL HIGH (ref 98–107)
Creatinine: 1.7 mg/dL — ABNORMAL HIGH (ref 0.6–1.1)
Glucose: 97 mg/dl (ref 70–99)
Potassium: 4.8 mEq/L (ref 3.5–5.1)
Sodium: 140 mEq/L (ref 136–145)
Total Bilirubin: 0.29 mg/dL (ref 0.20–1.20)
Total Protein: 7.4 g/dL (ref 6.4–8.3)

## 2012-12-04 LAB — CBC WITH DIFFERENTIAL/PLATELET
BASO%: 0.4 % (ref 0.0–2.0)
Basophils Absolute: 0.1 10*3/uL (ref 0.0–0.1)
EOS%: 0.7 % (ref 0.0–7.0)
Eosinophils Absolute: 0.1 10*3/uL (ref 0.0–0.5)
HCT: 37.1 % (ref 34.8–46.6)
HGB: 11.7 g/dL (ref 11.6–15.9)
LYMPH%: 19.4 % (ref 14.0–49.7)
MCH: 26.9 pg (ref 25.1–34.0)
MCHC: 31.4 g/dL — ABNORMAL LOW (ref 31.5–36.0)
MCV: 85.7 fL (ref 79.5–101.0)
MONO#: 0.9 10*3/uL (ref 0.1–0.9)
MONO%: 6.7 % (ref 0.0–14.0)
NEUT#: 10.1 10*3/uL — ABNORMAL HIGH (ref 1.5–6.5)
NEUT%: 72.8 % (ref 38.4–76.8)
Platelets: 269 10*3/uL (ref 145–400)
RBC: 4.33 10*6/uL (ref 3.70–5.45)
RDW: 14.4 % (ref 11.2–14.5)
WBC: 13.8 10*3/uL — ABNORMAL HIGH (ref 3.9–10.3)
lymph#: 2.7 10*3/uL (ref 0.9–3.3)

## 2012-12-04 NOTE — Progress Notes (Signed)
This office note has been dictated.

## 2012-12-04 NOTE — Telephone Encounter (Signed)
gv pt appt schedule for October and November 2014.

## 2012-12-04 NOTE — Patient Instructions (Addendum)
TAHLOR KOSIER  [id number]   [facility] - AFTER VISIT SUMMARY   **RECOMMENDATIONS MADE BY THE CONSULTANT AND ANY TEST    RESULTS WILL BE SENT TO YOUR REFERRING DOCTORS.   YOUR EXAM FINDINGS, LABS AND RESULTS WERE DISCUSSED BY YOUR MD TODAY.  YOU CAN GO TO THE [facility] WEB SITE FOR INSTRUCTIONS ON HOW TO ASSESS MY CHART FOR ADDITIONAL INFORMATION AS NEEDED.  Your Updated drug allergies are: Allergies as of 12/04/2012 - Review Complete 12/04/2012  Allergen Reaction Noted  . Iohexol  09/01/2008    Your current list of medications are: Current Outpatient Prescriptions  Medication Sig Dispense Refill  . AMLODIPINE BESYLATE PO Take 10 mg by mouth daily.      . febuxostat (ULORIC) 40 MG tablet Take 40 mg by mouth.      [name] PAROXETINE HCL PO Take 20 mg by mouth daily.      [name] rOPINIRole (REQUIP) 0.5 MG tablet Take 0.5 mg by mouth at bedtime as needed.         INSTRUCTIONS GIVEN AND DISCUSSED:  See attached schedule   SPECIAL INSTRUCTIONS/FOLLOW-UP:  See above.  I acknowledge that I have been informed and understand all the instructions given to me and received a copy.I know to contact the clinic, my physician, or go to the emergency Department if any problems should occur.   I do not have any more questions at this time, but understand that I may call the [facility] at [phone] during business hours should I have any further questions or need assistance in obtaining follow-up care.

## 2012-12-05 NOTE — Progress Notes (Signed)
CC:   Sherril Croon, M.D. Theda Belfast. Baird Cancer, M.D. Nelwyn Salisbury, MD, Marval Regal  IDENTIFYING STATEMENT:  The patient is a 56 year old woman who presents for followup.  INTERVAL HISTORY:  The patient was last seen 6 months ago.  She reports she was recently diagnosed with gout involving her left big toe.  She is currently being medicated, but the area has been painful.  She has not lost any weight.  She is afebrile.  She reports that Dr. Collene Mares had performed a colonoscopy in June 2013, which was unremarkable.  In addition, Dr. Collene Mares is following the patient for radiographic findings of cirrhosis.  The patient reports that she declined a liver biopsy.  She denies changes in bowel function.  She has had no evidence of bleeding.  MEDICATIONS:  Reviewed and updated.  ALLERGIES:  IV dye.  PHYSICAL EXAMINATION:  Patient is alert and oriented times 3.  Vital Signs:  Pulse 67, blood pressure 157/84, temperature 98, respirations 20, weight 238 pounds.  HEENT:  Head is atraumatic, normocephalic. Sclerae are anicteric.  Mouth is moist.  Chest:  Clear.  CVS: Unremarkable.  Abdomen:  Soft, nontender.  No masses.  Bowel sounds present.  Extremities:  No edema.  Pulses present symmetrical.  LABORATORY DATA:  On 12/04/2012, white cell count 13.8, hemoglobin 11.7, hematocrit 37.1, platelets 269.  Sodium 140, potassium 4.8, chloride 111, CO2 of 21, BUN 22, creatinine 1.7.  T bilirubin 0.29, alkaline phosphatase 107, AST 23, ALT 30, calcium 9.3.  CEA pending.  IMPRESSION AND PLAN:  Ms Digangi is a 56 year old woman with: 1. Chronic leukocytosis, evaluated extensively and felt to be     secondary to smoking. 2. History of colon cancer status post colectomy, diagnosed at an     outside facility.  She did not receive adjuvant chemotherapy.  Is     up to date on colonoscopy in June 2013; it was unremarkable. 3. Radiographical findings for cirrhosis.  Followed by Dr. Collene Mares.   She will continue surveillance  and follow up in 9 months' time with blood work.    ______________________________ Nira Retort, M.D. LIO/MEDQ  D:  12/04/2012  T:  12/05/2012  Job:  [id number]

## 2013-02-03 DIAGNOSIS — F17200 Nicotine dependence, unspecified, uncomplicated: Secondary | ICD-10-CM | POA: Diagnosis not present

## 2013-02-03 DIAGNOSIS — I1310 Hypertensive heart and chronic kidney disease without heart failure, with stage 1 through stage 4 chronic kidney disease, or unspecified chronic kidney disease: Secondary | ICD-10-CM | POA: Diagnosis not present

## 2013-02-03 DIAGNOSIS — Z6841 Body Mass Index (BMI) 40.0 and over, adult: Secondary | ICD-10-CM | POA: Diagnosis not present

## 2013-02-03 DIAGNOSIS — E559 Vitamin D deficiency, unspecified: Secondary | ICD-10-CM | POA: Diagnosis not present

## 2013-02-03 DIAGNOSIS — N183 Chronic kidney disease, stage 3 unspecified: Secondary | ICD-10-CM | POA: Diagnosis not present

## 2013-02-09 ENCOUNTER — Telehealth: Payer: Self-pay | Admitting: Hematology and Oncology

## 2013-02-09 NOTE — Telephone Encounter (Signed)
Sent medical records to [facility]

## 2013-02-15 DIAGNOSIS — D649 Anemia, unspecified: Secondary | ICD-10-CM | POA: Diagnosis not present

## 2013-02-15 DIAGNOSIS — N2581 Secondary hyperparathyroidism of renal origin: Secondary | ICD-10-CM | POA: Diagnosis not present

## 2013-02-15 DIAGNOSIS — N183 Chronic kidney disease, stage 3 unspecified: Secondary | ICD-10-CM | POA: Diagnosis not present

## 2013-02-20 ENCOUNTER — Telehealth: Payer: Self-pay | Admitting: Oncology

## 2013-02-20 ENCOUNTER — Encounter: Payer: Self-pay | Admitting: Oncology

## 2013-02-20 NOTE — Telephone Encounter (Signed)
s.w. pt and advised on Nov appt with new Dr. Alen Blew...pt ok...sent pt letter and appt schedule for NOV....former pt of Dr. Vickii Chafe

## 2013-03-03 DIAGNOSIS — I10 Essential (primary) hypertension: Secondary | ICD-10-CM | POA: Diagnosis not present

## 2013-03-03 DIAGNOSIS — M109 Gout, unspecified: Secondary | ICD-10-CM | POA: Diagnosis not present

## 2013-03-03 DIAGNOSIS — R7309 Other abnormal glucose: Secondary | ICD-10-CM | POA: Diagnosis not present

## 2013-03-03 DIAGNOSIS — R079 Chest pain, unspecified: Secondary | ICD-10-CM | POA: Diagnosis not present

## 2013-03-03 DIAGNOSIS — N183 Chronic kidney disease, stage 3 unspecified: Secondary | ICD-10-CM | POA: Diagnosis not present

## 2013-03-03 DIAGNOSIS — R0989 Other specified symptoms and signs involving the circulatory and respiratory systems: Secondary | ICD-10-CM | POA: Diagnosis not present

## 2013-03-03 DIAGNOSIS — J449 Chronic obstructive pulmonary disease, unspecified: Secondary | ICD-10-CM | POA: Diagnosis not present

## 2013-04-01 DIAGNOSIS — Z87891 Personal history of nicotine dependence: Secondary | ICD-10-CM | POA: Diagnosis not present

## 2013-04-01 DIAGNOSIS — I10 Essential (primary) hypertension: Secondary | ICD-10-CM | POA: Diagnosis not present

## 2013-04-01 DIAGNOSIS — R0989 Other specified symptoms and signs involving the circulatory and respiratory systems: Secondary | ICD-10-CM | POA: Diagnosis not present

## 2013-04-01 DIAGNOSIS — R0609 Other forms of dyspnea: Secondary | ICD-10-CM | POA: Diagnosis not present

## 2013-05-19 DIAGNOSIS — R51 Headache: Secondary | ICD-10-CM | POA: Diagnosis not present

## 2013-05-19 DIAGNOSIS — J449 Chronic obstructive pulmonary disease, unspecified: Secondary | ICD-10-CM | POA: Diagnosis not present

## 2013-05-19 DIAGNOSIS — H538 Other visual disturbances: Secondary | ICD-10-CM | POA: Diagnosis not present

## 2013-05-19 DIAGNOSIS — I1310 Hypertensive heart and chronic kidney disease without heart failure, with stage 1 through stage 4 chronic kidney disease, or unspecified chronic kidney disease: Secondary | ICD-10-CM | POA: Diagnosis not present

## 2013-05-19 DIAGNOSIS — E1129 Type 2 diabetes mellitus with other diabetic kidney complication: Secondary | ICD-10-CM | POA: Diagnosis not present

## 2013-05-19 DIAGNOSIS — N183 Chronic kidney disease, stage 3 unspecified: Secondary | ICD-10-CM | POA: Diagnosis not present

## 2013-05-19 DIAGNOSIS — E669 Obesity, unspecified: Secondary | ICD-10-CM | POA: Diagnosis not present

## 2013-06-16 ENCOUNTER — Encounter (HOSPITAL_COMMUNITY): Payer: Self-pay | Admitting: Adult Health

## 2013-06-16 ENCOUNTER — Emergency Department (HOSPITAL_COMMUNITY): Payer: Medicare Other

## 2013-06-16 ENCOUNTER — Emergency Department (HOSPITAL_COMMUNITY)
Admission: EM | Admit: 2013-06-16 | Discharge: 2013-06-16 | Discharge status: Against Medical Advice | Payer: Medicare Other | Attending: Emergency Medicine | Admitting: Emergency Medicine

## 2013-06-16 DIAGNOSIS — R230 Cyanosis: Secondary | ICD-10-CM | POA: Diagnosis not present

## 2013-06-16 DIAGNOSIS — M79675 Pain in left toe(s): Secondary | ICD-10-CM

## 2013-06-16 DIAGNOSIS — F17200 Nicotine dependence, unspecified, uncomplicated: Secondary | ICD-10-CM | POA: Insufficient documentation

## 2013-06-16 DIAGNOSIS — M79609 Pain in unspecified limb: Secondary | ICD-10-CM | POA: Diagnosis not present

## 2013-06-16 DIAGNOSIS — M7989 Other specified soft tissue disorders: Secondary | ICD-10-CM | POA: Diagnosis not present

## 2013-06-16 IMAGING — CR DG TOE 5TH 2+V*L*
3 series · 3 of 3 positions shown · non-contrast
Comparison: None.

CLINICAL DATA: Left fifth toe pain.  No known injury.  History of
gout.

DG TOE 5TH LEFT

[t toes ap left]
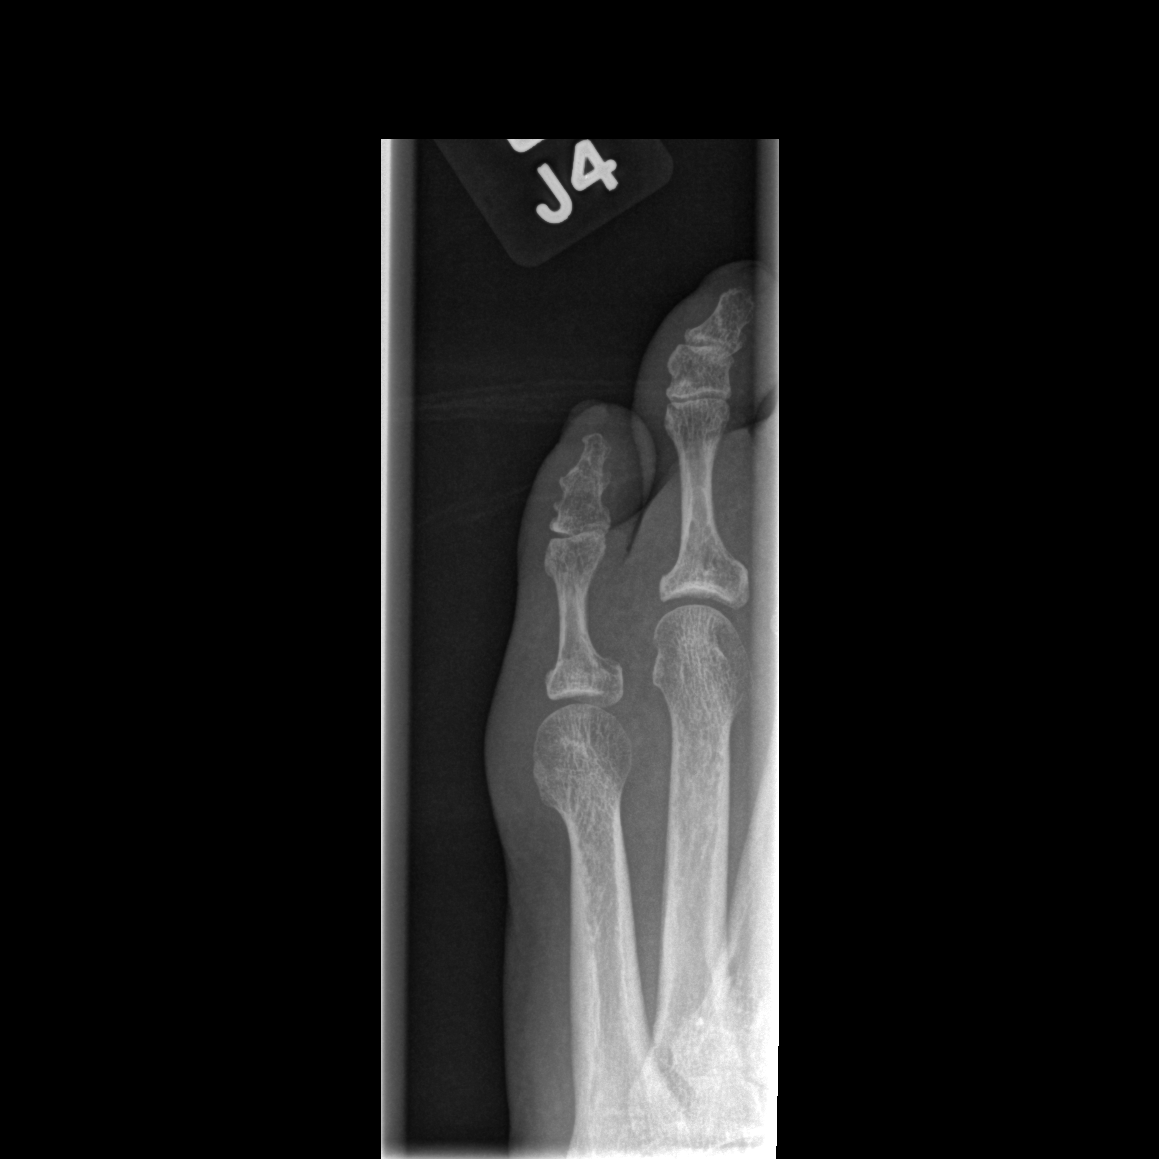

[t toes oblique left]
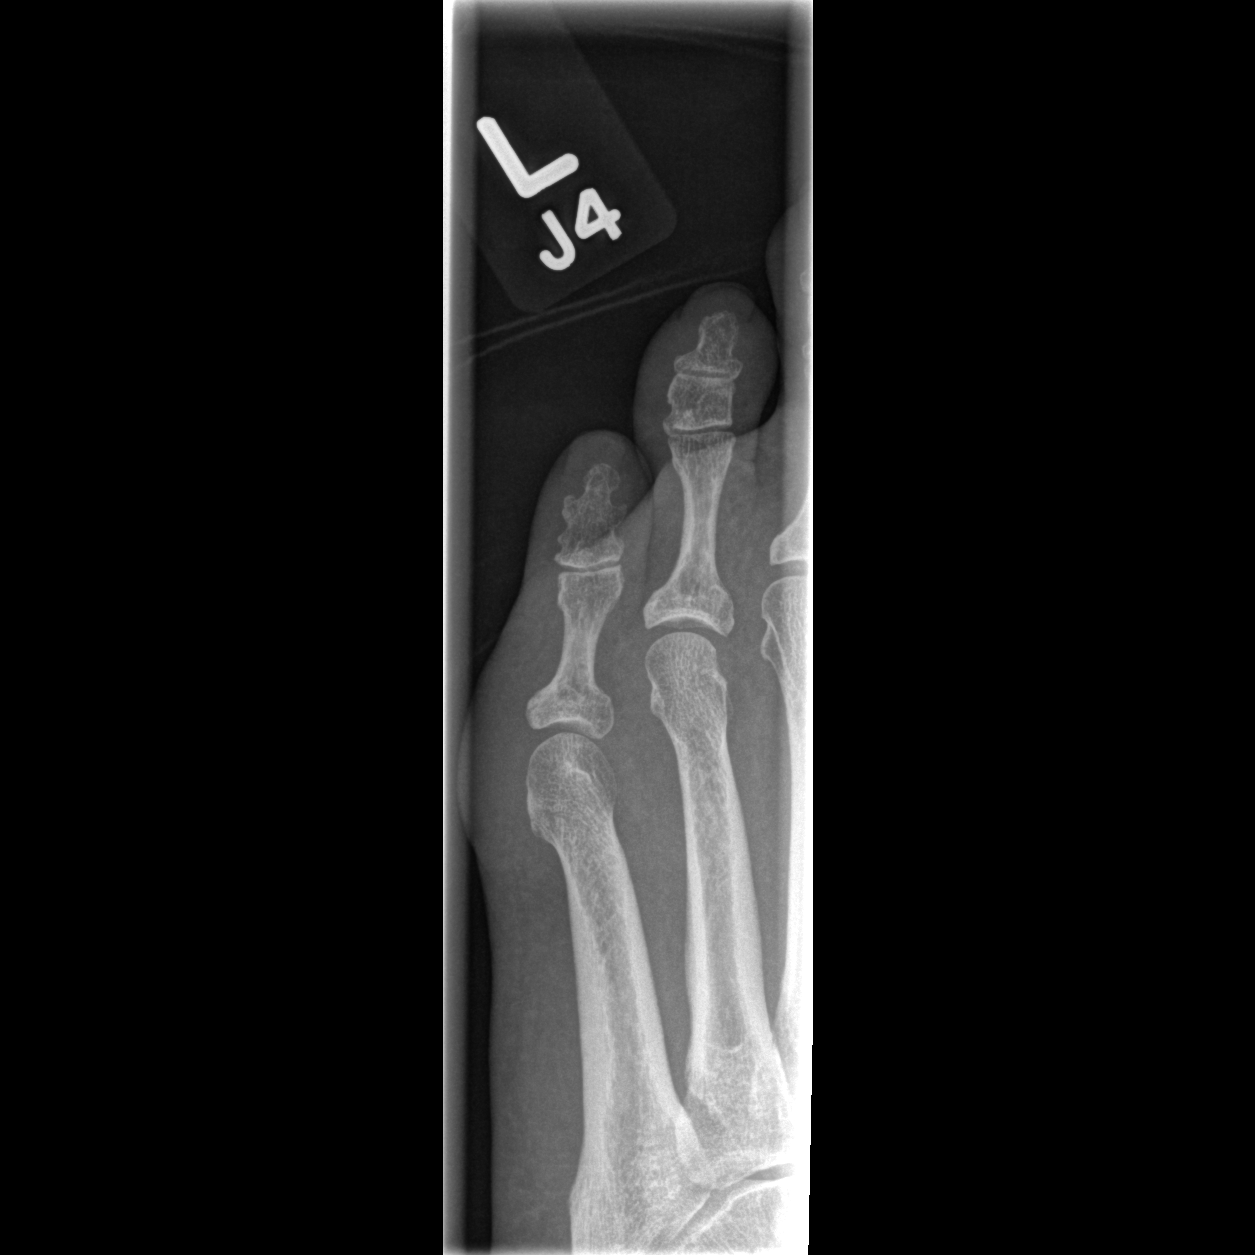

[t toes lateral left]
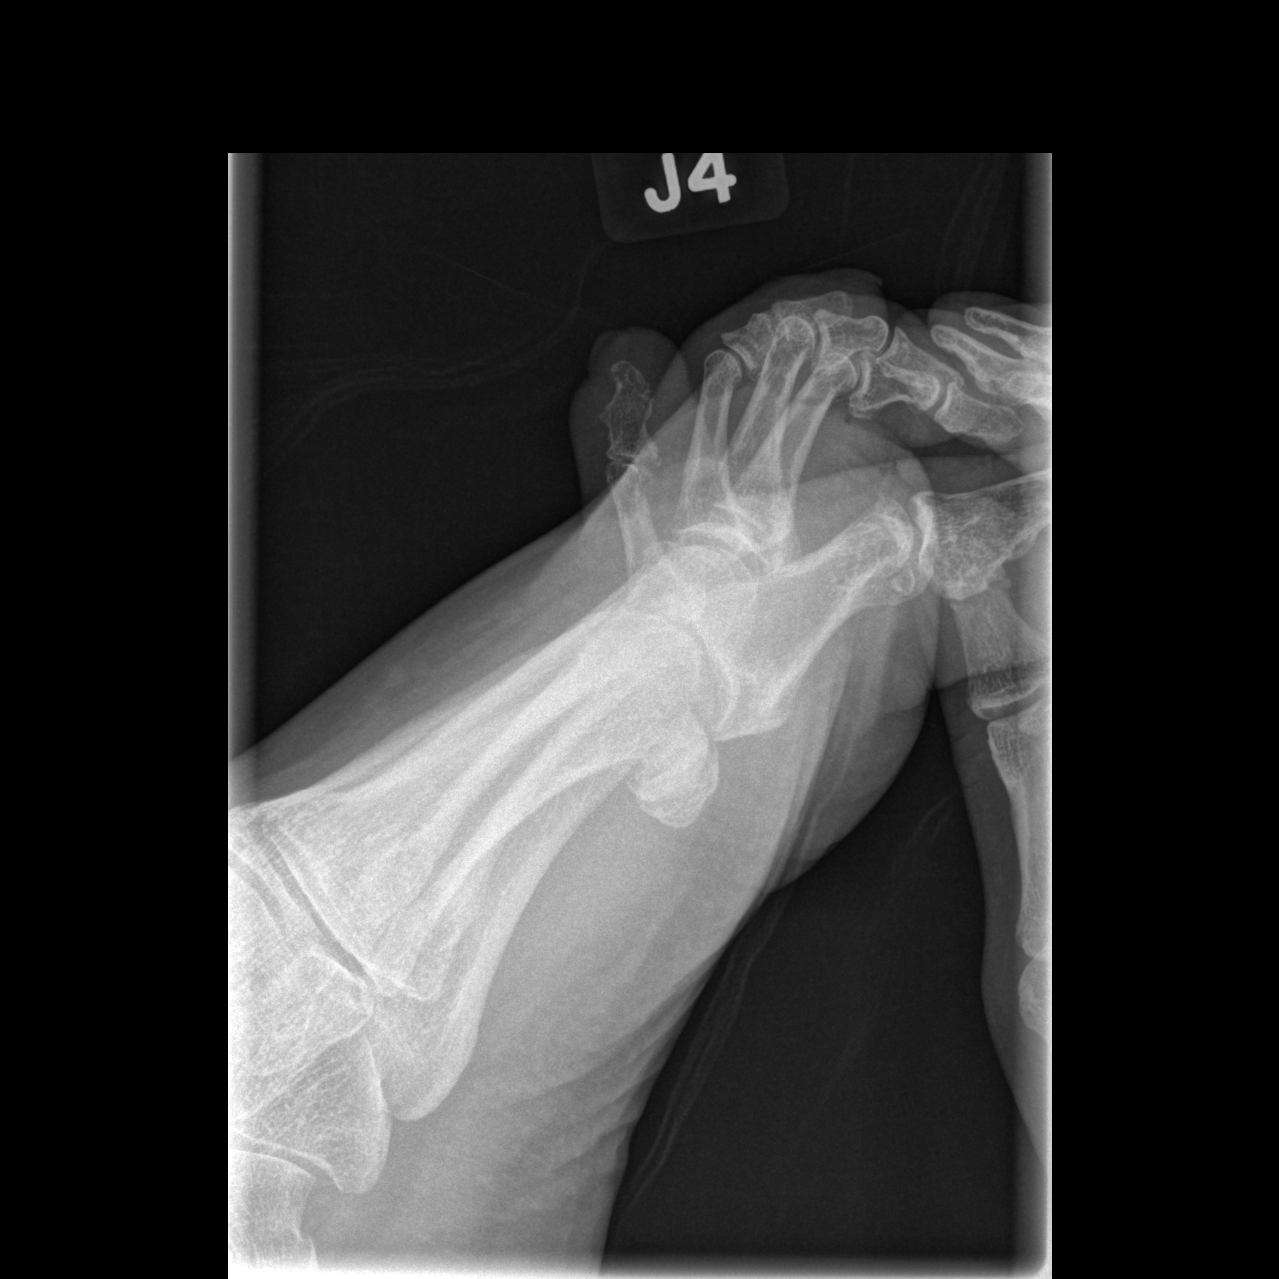

[3 of 3 positions shown; findings below may reference images not displayed]

FINDINGS: Normal bony mineralization.  The joints of the toe are
aligned.  No fracture or bony erosions.  No significant
degenerative changes.  There is prominent soft tissue swelling
lateral to the fifth metatarsophalangeal joint.  No soft tissue
calcifications.
IMPRESSION: 1.  Soft tissue swelling adjacent to the metatarsophalangeal joint
of the fifth toe.
2.  No acute bony abnormality or bony erosions.

## 2013-06-16 NOTE — ED Notes (Signed)
PT's family member out to nurse's station to inquire about wait. This RN asked EDPA about wait and then informed family that the PA had two patients ahead of her. Pt became extremely angry and began yelling at this RN. Pt stated she was leaving right now. Pt refused to sign AMA paperwork. EDPA made aware. Pt left room with family member

## 2013-06-16 NOTE — ED Notes (Signed)
Presents with left pinky toe cyanosis that began one and half weeks ago. +2 pedal pulse. Redness noted to back of left ankle. Dr. Venora Maples to bedside to evaluate.

## 2013-06-16 NOTE — ED Notes (Signed)
The patient left AMA and refused to sign out.  Per Oliver Pila, RN, the EDP is aware.

## 2013-06-27 NOTE — ED Provider Notes (Signed)
History    CSN: [id number] Arrival date & time 06/16/13  1749  First MD Initiated Contact with Patient 06/16/13 2103     Chief Complaint  Patient presents with  . Cyanosis    of the left pinky toe    HPI Patient reports discoloration of her left little toe that began approximately 8-10 days ago.  She denies numbness or weakness of her left lower extremity.  She also reports a rash to her left Achilles region.  She denies fevers and chills.  No numbness or tingling.  She denies recent trauma.  Review the medical record demonstrates no prior history of atrial fibrillation.  She's never had symptoms like this before.  Past Medical History  Diagnosis Date  . Colon cancer   . Anxiety   . Arthritis   . COPD (chronic obstructive pulmonary disease)   . Depression   . GERD (gastroesophageal reflux disease)   . Hypertension   . Meningitis   . Vertigo    Past Surgical History  Procedure Laterality Date  . Cesarean section      X 3 [id number]  . Cholecystectomy    . Abdominal hysterectomy    . Diverticulosis surgery-2002    . Colectomy  2010   History reviewed. No pertinent family history. History  Substance Use Topics  . Smoking status: Current Every Day Smoker -- 1.00 packs/day for 40 years  . Smokeless tobacco: Not on file  . Alcohol Use: No   OB History   Grav Para Term Preterm Abortions TAB SAB Ect Mult Living                 Review of Systems  All other systems reviewed and are negative.    Allergies  Iohexol  Home Medications   Current Outpatient Rx  Name  Route  Sig  Dispense  Refill  . allopurinol (ZYLOPRIM) 100 MG tablet   Oral   Take 100 mg by mouth daily.         [name] amLODipine (NORVASC) 10 MG tablet   Oral   Take 10 mg by mouth daily.         . carvedilol (COREG) 6.25 MG tablet   Oral   Take 12.5 mg by mouth 2 (two) times daily with a meal.         . Cholecalciferol (VITAMIN D-3) 5000 UNITS TABS   Oral   Take 5,000 Units by mouth  daily.         . colchicine 0.6 MG tablet   Oral   Take 0.6-1.2 mg by mouth 2 (two) times daily. 2 tabs in the am, then 1 tab 1 hr later         . meclizine (ANTIVERT) 25 MG tablet   Oral   Take 25 mg by mouth 3 (three) times daily as needed for dizziness or nausea.         [name] rOPINIRole (REQUIP) 0.5 MG tablet   Oral   Take 0.5 mg by mouth at bedtime as needed (RLS).          [name] sertraline (ZOLOFT) 50 MG tablet   Oral   Take 50 mg by mouth at bedtime.          BP 154/95  Pulse 72  Temp(Src) 99 F (37.2 C) (Oral)  Resp 22  SpO2 99% Physical Exam  Nursing note and vitals reviewed. Constitutional: She is oriented to person, place, and time. She appears well-developed and well-nourished.  HENT:  Head: Normocephalic.  Eyes: EOM are normal.  Neck: Normal range of motion.  Pulmonary/Chest: Effort normal.  Abdominal: She exhibits no distension.  Musculoskeletal:  Discoloration of left little toe. No spreading erythema. Toe is warm. Small punctate rash to left achilles region without erythema or tenderness  Neurological: She is alert and oriented to person, place, and time.  Psychiatric: She has a normal mood and affect.    ED Course  Procedures (including critical care time) Labs Reviewed - No data to display No results found. No diagnosis found.  MDM  Pt was placed back in the waiting room, xray ordered, no signs of infection. Doubt ischemia. Normal PT and DP pulses in left foot. Care will continue by my PA in the fast track region of the ER. MSE completed.   Based on the nursing notes it appears that the patient was waiting in the fast track region and became frustrated with the delay in my mid-level provider seeing the patient and therefore the patient left prior to completion of her evaluation.  Hoy Morn, MD 06/27/13 [phone]

## 2013-07-07 ENCOUNTER — Encounter (HOSPITAL_COMMUNITY): Payer: Self-pay | Admitting: Family Medicine

## 2013-07-07 ENCOUNTER — Emergency Department (HOSPITAL_COMMUNITY)
Admission: EM | Admit: 2013-07-07 | Discharge: 2013-07-07 | Disposition: A | Discharge status: Home / Self Care | Payer: Medicare Other | Attending: Emergency Medicine | Admitting: Emergency Medicine

## 2013-07-07 DIAGNOSIS — F17200 Nicotine dependence, unspecified, uncomplicated: Secondary | ICD-10-CM | POA: Insufficient documentation

## 2013-07-07 DIAGNOSIS — M79675 Pain in left toe(s): Secondary | ICD-10-CM

## 2013-07-07 DIAGNOSIS — R233 Spontaneous ecchymoses: Secondary | ICD-10-CM | POA: Diagnosis not present

## 2013-07-07 DIAGNOSIS — I10 Essential (primary) hypertension: Secondary | ICD-10-CM | POA: Diagnosis not present

## 2013-07-07 DIAGNOSIS — H04129 Dry eye syndrome of unspecified lacrimal gland: Secondary | ICD-10-CM | POA: Diagnosis not present

## 2013-07-07 DIAGNOSIS — M79609 Pain in unspecified limb: Secondary | ICD-10-CM | POA: Insufficient documentation

## 2013-07-07 DIAGNOSIS — M129 Arthropathy, unspecified: Secondary | ICD-10-CM | POA: Diagnosis not present

## 2013-07-07 DIAGNOSIS — N183 Chronic kidney disease, stage 3 unspecified: Secondary | ICD-10-CM | POA: Diagnosis not present

## 2013-07-07 DIAGNOSIS — L0291 Cutaneous abscess, unspecified: Secondary | ICD-10-CM | POA: Diagnosis not present

## 2013-07-07 DIAGNOSIS — F3289 Other specified depressive episodes: Secondary | ICD-10-CM | POA: Insufficient documentation

## 2013-07-07 DIAGNOSIS — K219 Gastro-esophageal reflux disease without esophagitis: Secondary | ICD-10-CM | POA: Insufficient documentation

## 2013-07-07 DIAGNOSIS — Z79899 Other long term (current) drug therapy: Secondary | ICD-10-CM | POA: Diagnosis not present

## 2013-07-07 DIAGNOSIS — J4489 Other specified chronic obstructive pulmonary disease: Secondary | ICD-10-CM | POA: Insufficient documentation

## 2013-07-07 DIAGNOSIS — R230 Cyanosis: Secondary | ICD-10-CM | POA: Diagnosis not present

## 2013-07-07 DIAGNOSIS — J449 Chronic obstructive pulmonary disease, unspecified: Secondary | ICD-10-CM | POA: Insufficient documentation

## 2013-07-07 DIAGNOSIS — F329 Major depressive disorder, single episode, unspecified: Secondary | ICD-10-CM | POA: Diagnosis not present

## 2013-07-07 DIAGNOSIS — M109 Gout, unspecified: Secondary | ICD-10-CM | POA: Diagnosis not present

## 2013-07-07 DIAGNOSIS — F411 Generalized anxiety disorder: Secondary | ICD-10-CM | POA: Insufficient documentation

## 2013-07-07 DIAGNOSIS — H1045 Other chronic allergic conjunctivitis: Secondary | ICD-10-CM | POA: Diagnosis not present

## 2013-07-07 DIAGNOSIS — E1129 Type 2 diabetes mellitus with other diabetic kidney complication: Secondary | ICD-10-CM | POA: Diagnosis not present

## 2013-07-07 DIAGNOSIS — E1165 Type 2 diabetes mellitus with hyperglycemia: Secondary | ICD-10-CM | POA: Diagnosis not present

## 2013-07-07 DIAGNOSIS — E119 Type 2 diabetes mellitus without complications: Secondary | ICD-10-CM | POA: Diagnosis not present

## 2013-07-07 MED ORDER — OXYCODONE-ACETAMINOPHEN 5-325 MG PO TABS: 1.0000 | ORAL_TABLET | Freq: Three times a day (TID) | ORAL | Status: DC | PRN

## 2013-07-07 MED ORDER — IBUPROFEN 800 MG PO TABS: 800.0000 mg | ORAL_TABLET | Freq: Three times a day (TID) | ORAL | Status: DC | PRN

## 2013-07-07 MED ORDER — CEPHALEXIN 500 MG PO CAPS: 500.0000 mg | ORAL_CAPSULE | Freq: Two times a day (BID) | ORAL | Status: DC

## 2013-07-07 NOTE — ED Provider Notes (Signed)
History    CSN: [id number] Arrival date & time 07/07/13  1422  First MD Initiated Contact with Patient 07/07/13 1636     Chief Complaint  Patient presents with  . Toe Pain   (Consider location/radiation/quality/duration/timing/severity/associated sxs/prior Treatment) Patient is a 57 y.o. female presenting with toe pain. The history is provided by the patient.  Toe Pain Associated symptoms comments: DAWNYA FLENNER is a 57 y.o. female here with L 4th toe pain.  Her symptoms started 1 month ago on her L 5th toe.  It became purple/black and she took motrin for pain relief.  These symptoms resolved 4 days ago, and now she states the same for her L 4th toe.  She denies any h/o vascular abnormalities.  She dies have a history of gout, but denies this feeling the same, in fact this feels worse ROS is otherwise negative for the patient..   Past Medical History  Diagnosis Date  . Colon cancer   . Anxiety   . Arthritis   . COPD (chronic obstructive pulmonary disease)   . Depression   . GERD (gastroesophageal reflux disease)   . Hypertension   . Meningitis   . Vertigo    Past Surgical History  Procedure Laterality Date  . Cesarean section      X 3 [id number]  . Cholecystectomy    . Abdominal hysterectomy    . Diverticulosis surgery-2002    . Colectomy  2010   History reviewed. No pertinent family history. History  Substance Use Topics  . Smoking status: Current Every Day Smoker -- 1.00 packs/day for 40 years  . Smokeless tobacco: Not on file  . Alcohol Use: No   OB History   Grav Para Term Preterm Abortions TAB SAB Ect Mult Living                 Review of Systems 10 Systems reviewed and are negative for acute change except as noted in the HPI.  Allergies  Iohexol  Home Medications   Current Outpatient Rx  Name  Route  Sig  Dispense  Refill  . allopurinol (ZYLOPRIM) 100 MG tablet   Oral   Take 100 mg by mouth daily.         [name] amLODipine (NORVASC) 10 MG tablet  Oral   Take 10 mg by mouth daily.         . carvedilol (COREG) 6.25 MG tablet   Oral   Take 12.5 mg by mouth 2 (two) times daily with a meal.         . Cholecalciferol (VITAMIN D-3) 5000 UNITS TABS   Oral   Take 5,000 Units by mouth daily.         . colchicine 0.6 MG tablet   Oral   Take 0.6-1.2 mg by mouth 2 (two) times daily. 2 tabs in the am, then 1 tab 1 hr later         . meclizine (ANTIVERT) 25 MG tablet   Oral   Take 25 mg by mouth 3 (three) times daily as needed for dizziness or nausea.         [name] rOPINIRole (REQUIP) 0.5 MG tablet   Oral   Take 0.5 mg by mouth at bedtime as needed (RLS).          [name] sertraline (ZOLOFT) 50 MG tablet   Oral   Take 50 mg by mouth at bedtime.         . cephALEXin (KEFLEX) 500 MG  capsule   Oral   Take 1 capsule (500 mg total) by mouth 2 (two) times daily.   14 capsule   0   . ibuprofen (ADVIL,MOTRIN) 800 MG tablet   Oral   Take 1 tablet (800 mg total) by mouth every 8 (eight) hours as needed for pain.   21 tablet   0   . oxyCODONE-acetaminophen (PERCOCET/ROXICET) 5-325 MG per tablet   Oral   Take 1 tablet by mouth every 8 (eight) hours as needed for pain.   10 tablet   0    BP 154/90  Pulse 77  Temp(Src) 98.5 F (36.9 C) (Oral)  Resp 18  SpO2 99% Physical Exam  Nursing note and vitals reviewed. Constitutional: She is oriented to person, place, and time. She appears well-developed and well-nourished. No distress.  HENT:  Head: Normocephalic and atraumatic.  Eyes: Conjunctivae and EOM are normal. Pupils are equal, round, and reactive to light. No scleral icterus.  Neck: Normal range of motion. Neck supple. No JVD present. No tracheal deviation present. No thyromegaly present.  Cardiovascular: Normal rate, regular rhythm and normal heart sounds.  Exam reveals no gallop and no friction rub.   No murmur heard. Pulmonary/Chest: Effort normal and breath sounds normal. No respiratory distress. She has no wheezes.  She exhibits no tenderness.  Abdominal: Soft. Bowel sounds are normal. She exhibits no distension and no mass. There is no tenderness. There is no rebound and no guarding.  Musculoskeletal: Normal range of motion. She exhibits tenderness. She exhibits no edema.  L 4th toe is slightly purple, but it is warm and well perfused.  2+ DP pulses bilaterally, no evidence of swelling.  It is tender to palpation.  There is blistering seen on the L 5th digit with normal color  Lymphadenopathy:    She has no cervical adenopathy.  Neurological: She is alert and oriented to person, place, and time.  Skin: Skin is warm and dry. No rash noted. No erythema. No pallor.    ED Course  Procedures (including critical care time) Labs Reviewed - No data to display No results found. 1. Toe pain, left     MDM  ABIs were obtained and were 1.05 bilaterally.  [name] differential includes a vascular abnormality vs infection.  She will be treated with keflex for possible cellulitis and immediate vascular follow up.  I offered to set up an appointment for the patient however she wants to leave sooner and states she will set it up herself.  Patient was given proper contact information.  Home with motrin and percocet for pain control.  Patient is amendable with this plan.  Everlene Balls, MD 07/07/13 [phone]

## 2013-07-07 NOTE — ED Notes (Signed)
Per MD request ABI measurements collected. R arm BP: 148/81. R leg: 156/71. L arm: 143/86. L leg: 151/95.

## 2013-07-07 NOTE — ED Notes (Signed)
Pt sts recently here for left small toe pain. sts still having severe pain to toe. There is a small blister to toe and toe is discolored.

## 2013-07-08 ENCOUNTER — Telehealth (HOSPITAL_COMMUNITY): Payer: Self-pay | Admitting: Emergency Medicine

## 2013-07-08 NOTE — ED Provider Notes (Signed)
I examined the patient (with the resident physician) and agree with the note, with the following additional thoughts.  Patient had good pulses, good cap refill, and aside from the lateral 2 digits of the left foot was in a unremarkable physical condition. Was some consideration of infection versus early hypoxia, though this seems unlikely given the patient's appropriate cap refill and good pulses, she was treated with antibiotics, discharged to follow up with vascular surgery.    Carmin Muskrat, MD 07/08/13 0010

## 2013-07-08 NOTE — ED Notes (Signed)
Pt given Rx for Percocet without signature.  Writted by Resident.  FM doesn't have DEA for prescriber.  Prescriber may be in at 3 p.  Will let RPh know if he comes in to provide [facility] #.

## 2013-07-12 ENCOUNTER — Other Ambulatory Visit: Payer: Self-pay | Admitting: *Deleted

## 2013-07-12 DIAGNOSIS — M79609 Pain in unspecified limb: Secondary | ICD-10-CM

## 2013-07-21 ENCOUNTER — Encounter (HOSPITAL_COMMUNITY): Payer: Self-pay | Admitting: Emergency Medicine

## 2013-07-21 ENCOUNTER — Emergency Department (HOSPITAL_COMMUNITY)
Admission: EM | Admit: 2013-07-21 | Discharge: 2013-07-21 | Disposition: A | Discharge status: Home / Self Care | Payer: Medicare Other | Attending: Emergency Medicine | Admitting: Emergency Medicine

## 2013-07-21 DIAGNOSIS — Z79899 Other long term (current) drug therapy: Secondary | ICD-10-CM | POA: Diagnosis not present

## 2013-07-21 DIAGNOSIS — Z8661 Personal history of infections of the central nervous system: Secondary | ICD-10-CM | POA: Insufficient documentation

## 2013-07-21 DIAGNOSIS — J449 Chronic obstructive pulmonary disease, unspecified: Secondary | ICD-10-CM | POA: Diagnosis not present

## 2013-07-21 DIAGNOSIS — I999 Unspecified disorder of circulatory system: Secondary | ICD-10-CM | POA: Diagnosis not present

## 2013-07-21 DIAGNOSIS — F17200 Nicotine dependence, unspecified, uncomplicated: Secondary | ICD-10-CM | POA: Diagnosis not present

## 2013-07-21 DIAGNOSIS — M629 Disorder of muscle, unspecified: Secondary | ICD-10-CM | POA: Insufficient documentation

## 2013-07-21 DIAGNOSIS — J4489 Other specified chronic obstructive pulmonary disease: Secondary | ICD-10-CM | POA: Insufficient documentation

## 2013-07-21 DIAGNOSIS — I998 Other disorder of circulatory system: Secondary | ICD-10-CM

## 2013-07-21 DIAGNOSIS — M129 Arthropathy, unspecified: Secondary | ICD-10-CM | POA: Diagnosis not present

## 2013-07-21 DIAGNOSIS — I10 Essential (primary) hypertension: Secondary | ICD-10-CM | POA: Insufficient documentation

## 2013-07-21 DIAGNOSIS — Z85038 Personal history of other malignant neoplasm of large intestine: Secondary | ICD-10-CM | POA: Diagnosis not present

## 2013-07-21 DIAGNOSIS — M79609 Pain in unspecified limb: Secondary | ICD-10-CM

## 2013-07-21 DIAGNOSIS — F411 Generalized anxiety disorder: Secondary | ICD-10-CM | POA: Insufficient documentation

## 2013-07-21 DIAGNOSIS — I739 Peripheral vascular disease, unspecified: Secondary | ICD-10-CM | POA: Diagnosis not present

## 2013-07-21 DIAGNOSIS — Z8719 Personal history of other diseases of the digestive system: Secondary | ICD-10-CM | POA: Insufficient documentation

## 2013-07-21 DIAGNOSIS — F329 Major depressive disorder, single episode, unspecified: Secondary | ICD-10-CM | POA: Insufficient documentation

## 2013-07-21 DIAGNOSIS — L97509 Non-pressure chronic ulcer of other part of unspecified foot with unspecified severity: Secondary | ICD-10-CM | POA: Diagnosis not present

## 2013-07-21 DIAGNOSIS — M2420 Disorder of ligament, unspecified site: Secondary | ICD-10-CM | POA: Insufficient documentation

## 2013-07-21 DIAGNOSIS — F3289 Other specified depressive episodes: Secondary | ICD-10-CM | POA: Insufficient documentation

## 2013-07-21 MED ORDER — ONDANSETRON 4 MG PO TBDP
8.0000 mg | ORAL_TABLET | Freq: Once | ORAL | Status: AC
  Administered 2013-07-21: 8 mg via ORAL
  Filled 2013-07-21: qty 2

## 2013-07-21 MED ORDER — ONDANSETRON 4 MG PO TBDP
4.0000 mg | ORAL_TABLET | Freq: Once | ORAL | Status: AC
  Administered 2013-07-21: 4 mg via ORAL
  Filled 2013-07-21: qty 1

## 2013-07-21 MED ORDER — HYDROMORPHONE HCL PF 2 MG/ML IJ SOLN
2.0000 mg | Freq: Once | INTRAMUSCULAR | Status: AC
  Administered 2013-07-21: 2 mg via INTRAMUSCULAR
  Filled 2013-07-21: qty 1

## 2013-07-21 MED ORDER — ONDANSETRON HCL 4 MG PO TABS: 4.0000 mg | ORAL_TABLET | Freq: Three times a day (TID) | ORAL | Status: DC | PRN

## 2013-07-21 NOTE — ED Notes (Signed)
Lorre Munroe, PA at bedside

## 2013-07-21 NOTE — ED Notes (Signed)
Pt c/o left toe pain with discoloration x 1 month with increasing pain; pt sts has vascular appt on 8/12

## 2013-07-21 NOTE — Consult Note (Signed)
Vascular Surgery Consultation  Reason for Consult: Ischemic left fourth and fifth toes  HPI: Whitney Walker is a 57 y.o. female who presents for evaluation of painful left fourth and fifth toes. This patient has been seen on 2 or 3 occasions since mid June for pain in the left fourth and fifth toes. It was felt that she had palpable pulses in both feet and she returns today for continued pain in the left fourth and fifth toe. The blister occurred on the fifth toe 3 weeks ago. She's had no chills and fever. She does not ambulate much. She denies classic claudication symptoms. She does not have diabetes mellitus according to her history. She does have a long history of tobacco abuse.   Past Medical History  Diagnosis Date  . Colon cancer   . Anxiety   . Arthritis   . COPD (chronic obstructive pulmonary disease)   . Depression   . GERD (gastroesophageal reflux disease)   . Hypertension   . Meningitis   . Vertigo    Past Surgical History  Procedure Laterality Date  . Cesarean section      X 3 [id number]  . Cholecystectomy    . Abdominal hysterectomy    . Diverticulosis surgery-2002    . Colectomy  2010   History   Social History  . Marital Status: Single    Spouse Name: N/A    Number of Children: N/A  . Years of Education: N/A   Social History Main Topics  . Smoking status: Current Every Day Smoker -- 1.00 packs/day for 40 years  . Smokeless tobacco: None  . Alcohol Use: No  . Drug Use: No  . Sexually Active:    Other Topics Concern  . None   Social History Narrative  . None   History reviewed. No pertinent family history. Allergies  Allergen Reactions  . Iohexol      Code: HIVES, Desc: HIVES W/ "DYE" USED FOR 1ST CT SCAN BUT NOT 2ND, NO PREMEDS USED, PT UNCERTAIN OF CIRCUMSTANCES,,?POSSIBLE MRI CONTRAST ALLERGY, ALL STUDIES DONE "SOMEWHERE" IN [place]//[place] Onset Date: [id number]    Prior to Admission medications   Medication Sig Start Date End Date  Taking? Authorizing Provider  allopurinol (ZYLOPRIM) 100 MG tablet Take 100 mg by mouth daily.   Yes Historical Provider, MD  amLODipine (NORVASC) 10 MG tablet Take 10 mg by mouth daily.   Yes Historical Provider, MD  carvedilol (COREG) 6.25 MG tablet Take 12.5 mg by mouth 2 (two) times daily with a meal.   Yes Historical Provider, MD  Cholecalciferol (VITAMIN D-3) 5000 UNITS TABS Take 5,000 Units by mouth daily.   Yes Historical Provider, MD  meclizine (ANTIVERT) 25 MG tablet Take 25 mg by mouth 3 (three) times daily as needed for dizziness or nausea.   Yes Historical Provider, MD  oxyCODONE-acetaminophen (PERCOCET/ROXICET) 5-325 MG per tablet Take 1 tablet by mouth every 8 (eight) hours as needed for pain. 07/07/13  Yes Everlene Balls, MD  rOPINIRole (REQUIP) 0.5 MG tablet Take 0.5 mg by mouth at bedtime as needed (RLS).    Yes Janne Napoleon, NP  sertraline (ZOLOFT) 50 MG tablet Take 50 mg by mouth every other day.    Yes Historical Provider, MD     Positive ROS: Denies chest pain but does complain of dyspnea on exertion. Complains of joint pain and is not ambulate long distances. Denies hemoptysis, PND, orthopnea, numbness in feet.  All other systems have been reviewed and were otherwise negative  with the exception of those mentioned in the HPI and as above.  Physical Exam: Filed Vitals:   07/21/13 1022  BP: 150/86  Pulse: 78  Temp:   Resp: 20    General: Alert, no acute distress-obese HEENT: Normal for age Cardiovascular: Regular rate and rhythm. Carotid pulses 2+, no bruits audible Respiratory: Clear to auscultation. No cyanosis, no use of accessory musculature GI: No organomegaly, abdomen is soft and non-tender-obese Skin: No lesions in the area of chief complaint Neurologic: Sensation intact distally Psychiatric: Patient is competent for consent with normal mood and affect Musculoskeletal: No obvious deformities Extremities: Right leg 3+ femoral 2+ popliteal and 2+ posterior tibial  pulse palpable. Left leg 3+ femoral absent popliteal and distal pulses. I used the ultrasound-Doppler and she does have monophasic flow in left popliteal, PT, MDP Left fourth and fifth toes are not ulcerated but are painful to touch. No gangrene noted.   Labs reviewed: Creatinine 1.7  Imaging reviewed: Vascular lab today perform ABIs 1.0 on the right and 0.57 on the left   Assessment/Plan:  57 year old female with long history of tobacco abuse and now with occlusive disease and left lower extremities and recent onset of pain in left fourth and fifth toes-suspect femoral popliteal and or tibial occlusive disease  Plan aortobifemoral angiography with left leg runoff and hopefully PTA or stenting of left superficial femoral or popliteal artery depending on findings. This will be scheduled for Monday, July 28 by Dr. Adele Barthel    discussed with patient and she is agreeable   Tinnie Gens, MD 07/21/2013 1:14 PM

## 2013-07-21 NOTE — Progress Notes (Signed)
VASCULAR LAB PRELIMINARY  ARTERIAL  ABI completed:  Right:  Within normal limits   Left:  Moderate decrease in arterial flow.  Note:  This test does not rule out embolus to the toes.    RIGHT    LEFT    PRESSURE WAVEFORM  PRESSURE WAVEFORM  BRACHIAL 148 Triphasic  BRACHIAL 156 Triphasic   DP   DP    AT 157 Triphasic  AT 89 Monophasic   PT 151 Monophasic   PT 87 Monophasic   PER   PER    GREAT TOE  NA GREAT TOE  NA    RIGHT LEFT  ABI 1.01 0.57     Rocky Rishel, RVT 07/21/2013, 12:23 PM

## 2013-07-21 NOTE — ED Provider Notes (Signed)
History    CSN: [id number] Arrival date & time 07/21/13  0919  First MD Initiated Contact with Patient 07/21/13 [phone]     Chief Complaint  Patient presents with  . Toe Pain   (Consider location/radiation/quality/duration/timing/severity/associated sxs/prior Treatment) HPI Comments: Patient presents emergency department with chief complaint of toe pain x2 months. She states that about June 5, she noticed toe pain on her fourth and fifth toes on left foot. She reports associated discoloration, as well as blistering on the fifth left toe. She states that she has been seen in the past for the same. She has a followup appointment scheduled with vascular surgery on August 12. She states the pain is severe, and she could not take it. She is tried taking Percocet for the pain with no relief. She states that nothing makes it better, and everything makes it worse. She states it feels like there is acid on her toes. She denies fevers, chills, nausea, or vomiting.  The history is provided by the patient. No language interpreter was used.   Past Medical History  Diagnosis Date  . Colon cancer   . Anxiety   . Arthritis   . COPD (chronic obstructive pulmonary disease)   . Depression   . GERD (gastroesophageal reflux disease)   . Hypertension   . Meningitis   . Vertigo    Past Surgical History  Procedure Laterality Date  . Cesarean section      X 3 [id number]  . Cholecystectomy    . Abdominal hysterectomy    . Diverticulosis surgery-2002    . Colectomy  2010   History reviewed. No pertinent family history. History  Substance Use Topics  . Smoking status: Current Every Day Smoker -- 1.00 packs/day for 40 years  . Smokeless tobacco: Not on file  . Alcohol Use: No   OB History   Grav Para Term Preterm Abortions TAB SAB Ect Mult Living                 Review of Systems  All other systems reviewed and are negative.    Allergies  Iohexol  Home Medications   Current Outpatient Rx   Name  Route  Sig  Dispense  Refill  . allopurinol (ZYLOPRIM) 100 MG tablet   Oral   Take 100 mg by mouth daily.         [name] amLODipine (NORVASC) 10 MG tablet   Oral   Take 10 mg by mouth daily.         . carvedilol (COREG) 6.25 MG tablet   Oral   Take 12.5 mg by mouth 2 (two) times daily with a meal.         . cephALEXin (KEFLEX) 500 MG capsule   Oral   Take 1 capsule (500 mg total) by mouth 2 (two) times daily.   14 capsule   0   . Cholecalciferol (VITAMIN D-3) 5000 UNITS TABS   Oral   Take 5,000 Units by mouth daily.         . colchicine 0.6 MG tablet   Oral   Take 0.6-1.2 mg by mouth 2 (two) times daily. 2 tabs in the am, then 1 tab 1 hr later         . ibuprofen (ADVIL,MOTRIN) 800 MG tablet   Oral   Take 1 tablet (800 mg total) by mouth every 8 (eight) hours as needed for pain.   21 tablet   0   . meclizine (ANTIVERT) 25  MG tablet   Oral   Take 25 mg by mouth 3 (three) times daily as needed for dizziness or nausea.         [name] oxyCODONE-acetaminophen (PERCOCET/ROXICET) 5-325 MG per tablet   Oral   Take 1 tablet by mouth every 8 (eight) hours as needed for pain.   10 tablet   0   . rOPINIRole (REQUIP) 0.5 MG tablet   Oral   Take 0.5 mg by mouth at bedtime as needed (RLS).          [name] sertraline (ZOLOFT) 50 MG tablet   Oral   Take 50 mg by mouth at bedtime.          BP 144/71  Pulse 77  Temp(Src) 98.6 F (37 C) (Oral)  Resp 18  SpO2 96% Physical Exam  Nursing note and vitals reviewed. Constitutional: She is oriented to person, place, and time. She appears well-developed and well-nourished.  HENT:  Head: Normocephalic and atraumatic.  Eyes: Conjunctivae and EOM are normal. Pupils are equal, round, and reactive to light.  Neck: Normal range of motion. Neck supple.  Cardiovascular: Normal rate and regular rhythm.  Exam reveals no gallop and no friction rub.   No murmur heard. Brisk capillary refill and intact distal pulses   Pulmonary/Chest: Effort normal and breath sounds normal. No respiratory distress. She has no wheezes. She has no rales. She exhibits no tenderness.  Abdominal: Soft. Bowel sounds are normal. She exhibits no distension and no mass. There is no tenderness. There is no rebound and no guarding.  Musculoskeletal: Normal range of motion. She exhibits no edema and no tenderness.  Left fourth and fifth toe are tender to palpation diffusely  Neurological: She is alert and oriented to person, place, and time.  Skin: Skin is warm and dry.  Very slight discoloration of the fourth and fifth toes on the left foot, there is also a small blister on the dorsal aspect of the fifth toe, there is no signs of trauma  Psychiatric: She has a normal mood and affect. Her behavior is normal. Judgment and thought content normal.    ED Course  Procedures (including critical care time)  VASCULAR LAB  PRELIMINARY  ARTERIAL  ABI completed: Right: Within normal limits Left: Moderate decrease in arterial flow. Note: This test does not rule out embolus to the toes.   RIGHT    LEFT     PRESSURE  WAVEFORM   PRESSURE  WAVEFORM   BRACHIAL  148  Triphasic  BRACHIAL  156  Triphasic   DP    DP     AT  157  Triphasic  AT  89  Monophasic   PT  151  Monophasic  PT  87  Monophasic   PER    PER     GREAT TOE   NA  GREAT TOE   NA     RIGHT  LEFT   ABI  1.01  0.57   CESTONE, HELENE, RVT  07/21/2013, 12:23 PM  Labs Reviewed - No data to display No results found. 1. Ischemia of toe     MDM  Patient with toe pain, she's been seen her previously. It was unclear whether this was infectious etiology versus a vascular issue. Patient was previously discharged to home with Keflex, and pain medicine. She reports no change in her symptoms. When the patient was seen previously, she had intact distal pulses and brisk capillary refill, this remains today. The toes do not look  necrotic.  10:58 AM Discussed the patient with Dr. Wyvonnia Dusky,  who will see the patient.  Pain is significantly improved with dilaudid.  1/10.  1:41 PM Patient seen by Dr. Kellie Simmering from vascular.  He has arranged for the patient to have an angiogram on Monday.  Recommends discharge to home with current pain medications.  I will add zofran.  Discussed the plan with the patient who understands and agrees.  Montine Circle, PA-C 07/21/13 1343

## 2013-07-21 NOTE — ED Notes (Signed)
PT DAUGHTER HAS ARRIVED AND IS DRIVING PT HOME

## 2013-07-21 NOTE — ED Notes (Signed)
Pt instructed yto wait in lobby before driving home. Pt verbalizes understanding.

## 2013-07-21 NOTE — ED Notes (Signed)
Pt has spoken to her daughter. She is enroute to the hospital and will drive pt home

## 2013-07-21 NOTE — ED Provider Notes (Signed)
Medical screening examination/treatment/procedure(s) were conducted as a shared visit with non-physician practitioner(s) and myself.  I personally evaluated the patient during the encounter  Painful L 4th and 5th digits on L foot that are dusky. Palpable DP and PT pulse.  Small blister on dorsal 5th digit. Check ABIs and d/w vascular surgery.  Ezequiel Essex, MD 07/21/13 1723

## 2013-07-22 ENCOUNTER — Other Ambulatory Visit: Payer: Self-pay

## 2013-07-26 ENCOUNTER — Ambulatory Visit (HOSPITAL_COMMUNITY)
Admission: RE | Admit: 2013-07-26 | Discharge: 2013-07-26 | Disposition: A | Discharge status: Home / Self Care | Payer: Medicare Other | Source: Ambulatory Visit | Attending: Vascular Surgery | Admitting: Vascular Surgery

## 2013-07-26 ENCOUNTER — Encounter (HOSPITAL_COMMUNITY)
Admission: RE | Disposition: A | Discharge status: Home / Self Care | Payer: Self-pay | Source: Ambulatory Visit | Attending: Vascular Surgery

## 2013-07-26 DIAGNOSIS — Z8661 Personal history of infections of the central nervous system: Secondary | ICD-10-CM | POA: Insufficient documentation

## 2013-07-26 DIAGNOSIS — F17200 Nicotine dependence, unspecified, uncomplicated: Secondary | ICD-10-CM | POA: Insufficient documentation

## 2013-07-26 DIAGNOSIS — I7092 Chronic total occlusion of artery of the extremities: Secondary | ICD-10-CM | POA: Insufficient documentation

## 2013-07-26 DIAGNOSIS — Z6837 Body mass index (BMI) 37.0-37.9, adult: Secondary | ICD-10-CM | POA: Diagnosis not present

## 2013-07-26 DIAGNOSIS — Z79899 Other long term (current) drug therapy: Secondary | ICD-10-CM | POA: Diagnosis not present

## 2013-07-26 DIAGNOSIS — Z91041 Radiographic dye allergy status: Secondary | ICD-10-CM | POA: Insufficient documentation

## 2013-07-26 DIAGNOSIS — I739 Peripheral vascular disease, unspecified: Secondary | ICD-10-CM | POA: Diagnosis not present

## 2013-07-26 DIAGNOSIS — F411 Generalized anxiety disorder: Secondary | ICD-10-CM | POA: Insufficient documentation

## 2013-07-26 DIAGNOSIS — L97509 Non-pressure chronic ulcer of other part of unspecified foot with unspecified severity: Secondary | ICD-10-CM | POA: Insufficient documentation

## 2013-07-26 DIAGNOSIS — J4489 Other specified chronic obstructive pulmonary disease: Secondary | ICD-10-CM | POA: Insufficient documentation

## 2013-07-26 DIAGNOSIS — J449 Chronic obstructive pulmonary disease, unspecified: Secondary | ICD-10-CM | POA: Diagnosis not present

## 2013-07-26 DIAGNOSIS — Z9049 Acquired absence of other specified parts of digestive tract: Secondary | ICD-10-CM | POA: Insufficient documentation

## 2013-07-26 DIAGNOSIS — E669 Obesity, unspecified: Secondary | ICD-10-CM | POA: Insufficient documentation

## 2013-07-26 DIAGNOSIS — I10 Essential (primary) hypertension: Secondary | ICD-10-CM | POA: Diagnosis not present

## 2013-07-26 DIAGNOSIS — L98499 Non-pressure chronic ulcer of skin of other sites with unspecified severity: Secondary | ICD-10-CM | POA: Insufficient documentation

## 2013-07-26 DIAGNOSIS — Z85038 Personal history of other malignant neoplasm of large intestine: Secondary | ICD-10-CM | POA: Diagnosis not present

## 2013-07-26 DIAGNOSIS — M129 Arthropathy, unspecified: Secondary | ICD-10-CM | POA: Diagnosis not present

## 2013-07-26 DIAGNOSIS — K219 Gastro-esophageal reflux disease without esophagitis: Secondary | ICD-10-CM | POA: Diagnosis not present

## 2013-07-26 HISTORY — PX: ABDOMINAL AORTAGRAM: SHX5454

## 2013-07-26 HISTORY — PX: LOWER EXTREMITY ANGIOGRAM: SHX5508

## 2013-07-26 LAB — POCT I-STAT, CHEM 8
BUN: 26 mg/dL — ABNORMAL HIGH (ref 6–23)
Calcium, Ion: 1.27 mmol/L — ABNORMAL HIGH (ref 1.12–1.23)
Chloride: 109 mEq/L (ref 96–112)
Creatinine, Ser: 1.7 mg/dL — ABNORMAL HIGH (ref 0.50–1.10)
Glucose, Bld: 118 mg/dL — ABNORMAL HIGH (ref 70–99)
HCT: 41 % (ref 36.0–46.0)
Hemoglobin: 13.9 g/dL (ref 12.0–15.0)
Potassium: 4.7 mEq/L (ref 3.5–5.1)
Sodium: 141 mEq/L (ref 135–145)
TCO2: 25 mmol/L (ref 0–100)

## 2013-07-26 SURGERY — ABDOMINAL AORTAGRAM
Anesthesia: LOCAL

## 2013-07-26 MED ORDER — ACETAMINOPHEN 325 MG PO TABS: 650.0000 mg | ORAL_TABLET | ORAL | Status: DC | PRN

## 2013-07-26 MED ORDER — SODIUM CHLORIDE 0.9 % IV SOLN: INTRAVENOUS | Status: DC

## 2013-07-26 MED ORDER — DIPHENHYDRAMINE HCL 50 MG/ML IJ SOLN
INTRAMUSCULAR | Status: AC
  Filled 2013-07-26: qty 1

## 2013-07-26 MED ORDER — METHYLPREDNISOLONE SODIUM SUCC 125 MG IJ SOLR: 125.0000 mg | INTRAMUSCULAR | Status: DC

## 2013-07-26 MED ORDER — LIDOCAINE HCL (PF) 1 % IJ SOLN
INTRAMUSCULAR | Status: AC
  Filled 2013-07-26: qty 30

## 2013-07-26 MED ORDER — HEPARIN (PORCINE) IN NACL 2-0.9 UNIT/ML-% IJ SOLN
INTRAMUSCULAR | Status: AC
  Filled 2013-07-26: qty 1000

## 2013-07-26 MED ORDER — FENTANYL CITRATE 0.05 MG/ML IJ SOLN
INTRAMUSCULAR | Status: AC
  Filled 2013-07-26: qty 2

## 2013-07-26 MED ORDER — ONDANSETRON HCL 4 MG/2ML IJ SOLN: 4.0000 mg | Freq: Four times a day (QID) | INTRAMUSCULAR | Status: DC | PRN

## 2013-07-26 MED ORDER — DIPHENHYDRAMINE HCL 50 MG/ML IJ SOLN
25.0000 mg | INTRAMUSCULAR | Status: AC
  Administered 2013-07-26: 50 mg via INTRAVENOUS

## 2013-07-26 MED ORDER — MORPHINE SULFATE 2 MG/ML IJ SOLN: 2.0000 mg | INTRAMUSCULAR | Status: DC | PRN

## 2013-07-26 MED ORDER — MIDAZOLAM HCL 2 MG/2ML IJ SOLN
INTRAMUSCULAR | Status: AC
  Filled 2013-07-26: qty 2

## 2013-07-26 MED ORDER — METHYLPREDNISOLONE SODIUM SUCC 125 MG IJ SOLR
INTRAMUSCULAR | Status: AC
  Administered 2013-07-26: 62.5 mg
  Filled 2013-07-26: qty 2

## 2013-07-26 MED ORDER — OXYCODONE-ACETAMINOPHEN 5-325 MG PO TABS: 1.0000 | ORAL_TABLET | ORAL | Status: DC | PRN

## 2013-07-26 MED ORDER — FAMOTIDINE IN NACL 20-0.9 MG/50ML-% IV SOLN
20.0000 mg | INTRAVENOUS | Status: AC
  Administered 2013-07-26: 20 mg via INTRAVENOUS

## 2013-07-26 MED ORDER — SODIUM CHLORIDE 0.9 % IV SOLN: 1.0000 mL/kg/h | INTRAVENOUS | Status: DC

## 2013-07-26 NOTE — Op Note (Signed)
OPERATIVE NOTE   PROCEDURE: 1.  Right common femoral artery cannulation under ultrasound guidance 2.  Aortogram with carbon dioxide 3.  Third order arterial selection 4.  Left leg runoff  PRE-OPERATIVE DIAGNOSIS: Left foot ulcer  POST-OPERATIVE DIAGNOSIS: same as above   SURGEON: Adele Barthel, MD  ANESTHESIA: conscious sedation  ESTIMATED BLOOD LOSS: 30 cc  CONTRAST: 105 cc  FINDING(S):  Aorta: patent  Superior mesenteric artery: patent Celiac artery: patent   Right Left  RA patent patent  CIA patent patent  EIA patent patent  IIA patent patent  CFA  patent  SFA  Occluded shortly after take off with reconstitution via profunda branches, some concern for possible distal superficial femoral artery thrombus raised  PFA  Patent  Pop  Patent  Trif  Patent  AT  Patent proximally, diminishes distally  Pero  Patent proximally, diminishes distally  PT  Patent, primary runoff of left foot   SPECIMEN(S):  none  INDICATIONS:   Whitney Walker is a 57 y.o. female who presents with left foot ulcer.  The patient presents for: aortogram, left leg runoff, and possible intervention.  I discussed with the patient the nature of angiographic procedures, especially the limited patencies of any endovascular intervention.  The patient is aware of that the risks of an angiographic procedure include but are not limited to: bleeding, infection, access site complications, renal failure, embolization, rupture of vessel, dissection, possible need for emergent surgical intervention, possible need for surgical procedures to treat the patient's pathology, and stroke and death.  The patient is aware of the risks and agrees to proceed.  DESCRIPTION: After full informed consent was obtained from the patient, the patient was brought back to the angiography suite.  The patient was placed supine upon the angiography table and connected to monitoring equipment.  The patient was then given conscious sedation,  the amounts of which are documented in the patient's chart.  The patient was prepped and drape in the standard fashion for an angiographic procedure.  At this point, attention was turned to the right groin.  Under ultrasound guidance, the right common femoral artery was cannulated with a micropuncture needle.  The microwire was advanced into the iliac arterial system.  The needle was exchanged for a microsheath, which was loaded into the common femoral artery over the wire.  The microwire was exchanged for a [facility] wire which was advanced into the aorta.  The microsheath was then exchanged for a 5-Fr sheath which was loaded into the common femoral artery.  The Omniflush catheter was then loaded over the wire up to the level of L1.  The catheter was connected to the carbon dioxide circuit.  After de-airring and de-clotting the circuit, a carbon dioxide aortogram was completed.  The [facility] wire was replaced in the catheter, and using the [place] and Omniflush catheter, the left common iliac artery was selected.  The wire advanced into the common femoral artery but the catheter would not advance so it was exchanged for a endhole catheter, which was advanced into the left common femoral artery.  The wire was removed and the catheter was connected again to the carbon dioxide circuit.  The left femoral artery was imaged from bifurcation down to trifurcation.  As I was considering intervention, I felt contrast images were necessary.  The catheter was connected to the power injector circuit.  The left leg runoff was completed in stations.  During this process, this patient had great difficulty holding still and following instructions.  Multiple repeated images were necessary subsequently.  By prior experiences, I do not think this patient is a good intervention candidate in this setting.  If she turns out to be a poor surgical candidate, reconsideration for intervention in the hybrid OR could be made with the patient sedated  with deep MAC or general anesthesia.  The catheter was removed.  The sheath was aspirated.  No clots were present and the sheath was reloaded with heparinized saline.    COMPLICATIONS: none  CONDITION: stable  Adele Barthel, MD [facility] Office: [phone] Pager: [phone]  07/26/2013, 2:36 PM

## 2013-07-26 NOTE — Interval H&P Note (Signed)
[facility]  History and Physical Update  The patient was interviewed and re-examined.  The patient's previous History and Physical has been reviewed and is unchanged from Dr. Evelena Leyden consult on: 07/21/13.  There is no change in the plan of care: aortogram with CO2, Left leg runoff, and possible intervention.  Adele Barthel, MD [facility] [place] Office: [phone] Pager: [phone]  07/26/2013, 11:59 AM

## 2013-07-26 NOTE — H&P (View-Only) (Signed)
Vascular Surgery Consultation  Reason for Consult: Ischemic left fourth and fifth toes  HPI: Whitney Walker is a 57 y.o. female who presents for evaluation of painful left fourth and fifth toes. This patient has been seen on 2 or 3 occasions since mid June for pain in the left fourth and fifth toes. It was felt that she had palpable pulses in both feet and she returns today for continued pain in the left fourth and fifth toe. The blister occurred on the fifth toe 3 weeks ago. She's had no chills and fever. She does not ambulate much. She denies classic claudication symptoms. She does not have diabetes mellitus according to her history. She does have a long history of tobacco abuse.   Past Medical History  Diagnosis Date  . Colon cancer   . Anxiety   . Arthritis   . COPD (chronic obstructive pulmonary disease)   . Depression   . GERD (gastroesophageal reflux disease)   . Hypertension   . Meningitis   . Vertigo    Past Surgical History  Procedure Laterality Date  . Cesarean section      X 3 [id number]  . Cholecystectomy    . Abdominal hysterectomy    . Diverticulosis surgery-2002    . Colectomy  2010   History   Social History  . Marital Status: Single    Spouse Name: N/A    Number of Children: N/A  . Years of Education: N/A   Social History Main Topics  . Smoking status: Current Every Day Smoker -- 1.00 packs/day for 40 years  . Smokeless tobacco: None  . Alcohol Use: No  . Drug Use: No  . Sexually Active:    Other Topics Concern  . None   Social History Narrative  . None   History reviewed. No pertinent family history. Allergies  Allergen Reactions  . Iohexol      Code: HIVES, Desc: HIVES W/ "DYE" USED FOR 1ST CT SCAN BUT NOT 2ND, NO PREMEDS USED, PT UNCERTAIN OF CIRCUMSTANCES,,?POSSIBLE MRI CONTRAST ALLERGY, ALL STUDIES DONE "SOMEWHERE" IN [place]//[place] Onset Date: [id number]    Prior to Admission medications   Medication Sig Start Date End Date  Taking? Authorizing Provider  allopurinol (ZYLOPRIM) 100 MG tablet Take 100 mg by mouth daily.   Yes Historical Provider, MD  amLODipine (NORVASC) 10 MG tablet Take 10 mg by mouth daily.   Yes Historical Provider, MD  carvedilol (COREG) 6.25 MG tablet Take 12.5 mg by mouth 2 (two) times daily with a meal.   Yes Historical Provider, MD  Cholecalciferol (VITAMIN D-3) 5000 UNITS TABS Take 5,000 Units by mouth daily.   Yes Historical Provider, MD  meclizine (ANTIVERT) 25 MG tablet Take 25 mg by mouth 3 (three) times daily as needed for dizziness or nausea.   Yes Historical Provider, MD  oxyCODONE-acetaminophen (PERCOCET/ROXICET) 5-325 MG per tablet Take 1 tablet by mouth every 8 (eight) hours as needed for pain. 07/07/13  Yes Everlene Balls, MD  rOPINIRole (REQUIP) 0.5 MG tablet Take 0.5 mg by mouth at bedtime as needed (RLS).    Yes Janne Napoleon, NP  sertraline (ZOLOFT) 50 MG tablet Take 50 mg by mouth every other day.    Yes Historical Provider, MD     Positive ROS: Denies chest pain but does complain of dyspnea on exertion. Complains of joint pain and is not ambulate long distances. Denies hemoptysis, PND, orthopnea, numbness in feet.  All other systems have been reviewed and were otherwise negative  with the exception of those mentioned in the HPI and as above.  Physical Exam: Filed Vitals:   07/21/13 1022  BP: 150/86  Pulse: 78  Temp:   Resp: 20    General: Alert, no acute distress-obese HEENT: Normal for age Cardiovascular: Regular rate and rhythm. Carotid pulses 2+, no bruits audible Respiratory: Clear to auscultation. No cyanosis, no use of accessory musculature GI: No organomegaly, abdomen is soft and non-tender-obese Skin: No lesions in the area of chief complaint Neurologic: Sensation intact distally Psychiatric: Patient is competent for consent with normal mood and affect Musculoskeletal: No obvious deformities Extremities: Right leg 3+ femoral 2+ popliteal and 2+ posterior tibial  pulse palpable. Left leg 3+ femoral absent popliteal and distal pulses. I used the ultrasound-Doppler and she does have monophasic flow in left popliteal, PT, MDP Left fourth and fifth toes are not ulcerated but are painful to touch. No gangrene noted.   Labs reviewed: Creatinine 1.7  Imaging reviewed: Vascular lab today perform ABIs 1.0 on the right and 0.57 on the left   Assessment/Plan:  57 year old female with long history of tobacco abuse and now with occlusive disease and left lower extremities and recent onset of pain in left fourth and fifth toes-suspect femoral popliteal and or tibial occlusive disease  Plan aortobifemoral angiography with left leg runoff and hopefully PTA or stenting of left superficial femoral or popliteal artery depending on findings. This will be scheduled for Monday, July 28 by Dr. Adele Barthel    discussed with patient and she is agreeable   Tinnie Gens, MD 07/21/2013 1:14 PM

## 2013-07-27 ENCOUNTER — Telehealth: Payer: Self-pay | Admitting: *Deleted

## 2013-07-27 ENCOUNTER — Other Ambulatory Visit: Payer: Self-pay | Admitting: *Deleted

## 2013-07-27 DIAGNOSIS — Z01810 Encounter for preprocedural cardiovascular examination: Secondary | ICD-10-CM

## 2013-07-27 NOTE — Telephone Encounter (Signed)
Mrs Moothart wanted to know what the procedure yesterday showed and what she was to expect now. I talked with Dr Argie Ramming reviewed the films. I have set her up to see Dr Kellie Simmering 07/30/13 @ 11:00am. He also wants her to get cardiac clearance for a possible left Femoral - Popliteal BPG in the near future. She said her cardiologists is Dr Einar Gip

## 2013-07-28 ENCOUNTER — Other Ambulatory Visit: Payer: Self-pay | Admitting: *Deleted

## 2013-07-28 ENCOUNTER — Telehealth: Payer: Self-pay | Admitting: Vascular Surgery

## 2013-07-28 DIAGNOSIS — Z01810 Encounter for preprocedural cardiovascular examination: Secondary | ICD-10-CM

## 2013-07-28 NOTE — Telephone Encounter (Addendum)
Message copied by Gena Fray on Wed Jul 28, 2013 10:12 AM ------      Message from: Alfonso Patten      Created: Tue Jul 27, 2013  3:03 PM       Dr Darrin Nipper wants to see this pt Friday 07/30/13 @ 11:00am. He, also, wants her to have a cardiolyte. Her cardiologist is Dr Einar Gip.       Thanks      Bethena Roys ------  Spoke with patient to notify of appt with Dr Milderd Meager office on 08/06/2013 @ 9;00am- their office will mail an instruction letter, dpm

## 2013-07-29 ENCOUNTER — Encounter: Payer: Self-pay | Admitting: Vascular Surgery

## 2013-07-30 ENCOUNTER — Encounter: Payer: Self-pay | Admitting: Vascular Surgery

## 2013-07-30 ENCOUNTER — Other Ambulatory Visit: Payer: Self-pay | Admitting: *Deleted

## 2013-07-30 ENCOUNTER — Encounter (HOSPITAL_COMMUNITY): Payer: Self-pay | Admitting: Pharmacy Technician

## 2013-07-30 ENCOUNTER — Ambulatory Visit (INDEPENDENT_AMBULATORY_CARE_PROVIDER_SITE_OTHER): Payer: Medicare Other | Admitting: Vascular Surgery

## 2013-07-30 ENCOUNTER — Encounter: Payer: Self-pay | Admitting: *Deleted

## 2013-07-30 VITALS — BP 140/98 | HR 92 | Resp 22 | Ht 63.0 in | Wt 243.2 lb

## 2013-07-30 DIAGNOSIS — L98499 Non-pressure chronic ulcer of skin of other sites with unspecified severity: Secondary | ICD-10-CM | POA: Diagnosis not present

## 2013-07-30 DIAGNOSIS — M79609 Pain in unspecified limb: Secondary | ICD-10-CM | POA: Insufficient documentation

## 2013-07-30 DIAGNOSIS — I739 Peripheral vascular disease, unspecified: Secondary | ICD-10-CM | POA: Diagnosis not present

## 2013-07-30 DIAGNOSIS — I7025 Atherosclerosis of native arteries of other extremities with ulceration: Secondary | ICD-10-CM | POA: Insufficient documentation

## 2013-07-30 HISTORY — DX: Pain in unspecified limb: M79.609

## 2013-07-30 NOTE — Progress Notes (Signed)
Subjective:     Patient ID: Whitney Walker, female   DOB: 03/29/1956, 57 y.o.   MRN: [id number]  HPI 57 year old female returns to discuss further treatment regarding her ischemic left fourth and fifth toes. This occurred several weeks ago and she has been having significant pain and no status. There has been blistering. I saw her in the emergency department a few weeks ago and arrange for angiography by Dr. Lurline Hare. He was unable to perform percutaneous revascularization and she now returns to discuss left femoral-popliteal bypass grafting. She has no definite history of coronary artery disease but does have an abnormal EKG. She has occasional chest discomfort as well as dyspnea on exertion, orthopnea, leg pain, and weakness in her arms and legs, and dizziness.  Past Medical History  Diagnosis Date  . Colon cancer   . Anxiety   . Arthritis   . COPD (chronic obstructive pulmonary disease)   . Depression   . GERD (gastroesophageal reflux disease)   . Hypertension   . Meningitis   . Vertigo     History  Substance Use Topics  . Smoking status: Current Every Day Smoker -- 1.00 packs/day for 40 years    Types: Cigarettes  . Smokeless tobacco: Never Used  . Alcohol Use: No    Family History  Problem Relation Age of Onset  . Cancer Mother     breast and bone  . Cancer Father     prostate    Allergies  Allergen Reactions  . Iohexol      Code: HIVES, Desc: HIVES W/ "DYE" USED FOR 1ST CT SCAN BUT NOT 2ND, NO PREMEDS USED, PT UNCERTAIN OF CIRCUMSTANCES,,?POSSIBLE MRI CONTRAST ALLERGY, ALL STUDIES DONE "SOMEWHERE" IN [place]//[place] Onset Date: [id number]     Current outpatient prescriptions:allopurinol (ZYLOPRIM) 100 MG tablet, Take 100 mg by mouth daily., Disp: , Rfl: ;  amLODipine (NORVASC) 10 MG tablet, Take 10 mg by mouth daily., Disp: , Rfl: ;  carvedilol (COREG) 6.25 MG tablet, Take 12.5 mg by mouth 2 (two) times daily with a meal., Disp: , Rfl: ;  Cholecalciferol (VITAMIN D-3)  5000 UNITS TABS, Take 5,000 Units by mouth daily., Disp: , Rfl:  ondansetron (ZOFRAN) 4 MG tablet, Take 1 tablet (4 mg total) by mouth every 8 (eight) hours as needed for nausea., Disp: 10 tablet, Rfl: 0;  oxyCODONE-acetaminophen (PERCOCET/ROXICET) 5-325 MG per tablet, Take 1 tablet by mouth every 8 (eight) hours as needed for pain., Disp: , Rfl: ;  rOPINIRole (REQUIP) 0.5 MG tablet, Take 0.5 mg by mouth at bedtime as needed (RLS). , Disp: , Rfl:  meclizine (ANTIVERT) 25 MG tablet, Take 25 mg by mouth 3 (three) times daily as needed for dizziness or nausea., Disp: , Rfl: ;  sertraline (ZOLOFT) 50 MG tablet, Take 50 mg by mouth every other day. , Disp: , Rfl:   BP 140/98  Pulse 92  Resp 22  Ht 5\' 3"  (1.6 m)  Wt 243 lb 3.2 oz (110.315 kg)  BMI 43.09 kg/m2  Body mass index is 43.09 kg/(m^2).        Review of Systems as noted above the patient complains of chest discomfort on occasion, dyspnea on exertion, orthopnea, leg discomfort, chills, dizziness, and weakness in her arms and legs. All other systems negative complete review of systems    Objective:   Physical Exam blood pressure 140/98 heart rate is 92 respirations 20 Gen.-alert and oriented x3 in no apparent distress-obese HEENT normal for age Lungs no rhonchi or  wheezing Cardiovascular regular rhythm no murmurs carotid pulses 3+ palpable no bruits audible Abdomen soft nontender no palpable masses-obese Musculoskeletal free of  major deformities Skin clear -no rashes Neurologic normal Lower extremities 3+ femoral and absent popliteal and pedal pulses left foot. There are ischemic changes left fourth and fifth toes with some dry blistering of the fifth toe but no active infection. No gangrene is noted.      Assessment:     Left femoral-popliteal occlusive disease with patent above-knee left popliteal artery and three-vessel runoff-ischemic left fourth and fifth toes Patient has been followed by Dr. Clayton Lefort and she will be  seeing him early next week for cardiac clearance.    Plan:     Plan left femoral-popliteal bypass graft on Friday, August 15 using the saphenous vein or PTFE depending on quality of the vein Discussed this with patient and she does understand that there is no guarantee that this will be successful and that she will continue to have some pain in her fourth and fifth toes as they heal  She would like to proceed as outlined above

## 2013-08-06 DIAGNOSIS — Z01810 Encounter for preprocedural cardiovascular examination: Secondary | ICD-10-CM | POA: Diagnosis not present

## 2013-08-10 ENCOUNTER — Encounter (HOSPITAL_COMMUNITY)
Admission: RE | Admit: 2013-08-10 | Discharge: 2013-08-10 | Disposition: A | Discharge status: Home / Self Care | Payer: Medicare Other | Source: Ambulatory Visit | Attending: Anesthesiology | Admitting: Anesthesiology

## 2013-08-10 ENCOUNTER — Encounter (HOSPITAL_COMMUNITY)
Admission: RE | Admit: 2013-08-10 | Discharge: 2013-08-10 | Disposition: A | Discharge status: Home / Self Care | Payer: Medicare Other | Source: Ambulatory Visit | Attending: Vascular Surgery | Admitting: Vascular Surgery

## 2013-08-10 ENCOUNTER — Encounter: Payer: Medicare Other | Admitting: Vascular Surgery

## 2013-08-10 ENCOUNTER — Encounter (HOSPITAL_COMMUNITY): Payer: Self-pay

## 2013-08-10 DIAGNOSIS — Z808 Family history of malignant neoplasm of other organs or systems: Secondary | ICD-10-CM | POA: Diagnosis not present

## 2013-08-10 DIAGNOSIS — K219 Gastro-esophageal reflux disease without esophagitis: Secondary | ICD-10-CM | POA: Diagnosis not present

## 2013-08-10 DIAGNOSIS — Z8042 Family history of malignant neoplasm of prostate: Secondary | ICD-10-CM | POA: Diagnosis not present

## 2013-08-10 DIAGNOSIS — Z803 Family history of malignant neoplasm of breast: Secondary | ICD-10-CM | POA: Diagnosis not present

## 2013-08-10 DIAGNOSIS — F329 Major depressive disorder, single episode, unspecified: Secondary | ICD-10-CM | POA: Diagnosis present

## 2013-08-10 DIAGNOSIS — I70269 Atherosclerosis of native arteries of extremities with gangrene, unspecified extremity: Secondary | ICD-10-CM | POA: Diagnosis not present

## 2013-08-10 DIAGNOSIS — Z85038 Personal history of other malignant neoplasm of large intestine: Secondary | ICD-10-CM | POA: Diagnosis not present

## 2013-08-10 DIAGNOSIS — I10 Essential (primary) hypertension: Secondary | ICD-10-CM | POA: Diagnosis not present

## 2013-08-10 DIAGNOSIS — C189 Malignant neoplasm of colon, unspecified: Secondary | ICD-10-CM | POA: Diagnosis not present

## 2013-08-10 DIAGNOSIS — N189 Chronic kidney disease, unspecified: Secondary | ICD-10-CM | POA: Diagnosis not present

## 2013-08-10 DIAGNOSIS — F411 Generalized anxiety disorder: Secondary | ICD-10-CM | POA: Diagnosis present

## 2013-08-10 DIAGNOSIS — I739 Peripheral vascular disease, unspecified: Secondary | ICD-10-CM | POA: Diagnosis not present

## 2013-08-10 DIAGNOSIS — I70219 Atherosclerosis of native arteries of extremities with intermittent claudication, unspecified extremity: Secondary | ICD-10-CM | POA: Diagnosis not present

## 2013-08-10 DIAGNOSIS — J4489 Other specified chronic obstructive pulmonary disease: Secondary | ICD-10-CM | POA: Diagnosis not present

## 2013-08-10 DIAGNOSIS — Z01812 Encounter for preprocedural laboratory examination: Secondary | ICD-10-CM | POA: Diagnosis not present

## 2013-08-10 DIAGNOSIS — M79609 Pain in unspecified limb: Secondary | ICD-10-CM | POA: Diagnosis not present

## 2013-08-10 DIAGNOSIS — I70209 Unspecified atherosclerosis of native arteries of extremities, unspecified extremity: Secondary | ICD-10-CM | POA: Diagnosis not present

## 2013-08-10 DIAGNOSIS — J449 Chronic obstructive pulmonary disease, unspecified: Secondary | ICD-10-CM | POA: Diagnosis not present

## 2013-08-10 DIAGNOSIS — F17200 Nicotine dependence, unspecified, uncomplicated: Secondary | ICD-10-CM | POA: Diagnosis present

## 2013-08-10 DIAGNOSIS — Z01818 Encounter for other preprocedural examination: Secondary | ICD-10-CM | POA: Diagnosis not present

## 2013-08-10 HISTORY — DX: Peripheral vascular disease, unspecified: I73.9

## 2013-08-10 LAB — SURGICAL PCR SCREEN
MRSA, PCR: NEGATIVE
Staphylococcus aureus: NEGATIVE

## 2013-08-10 LAB — PROTIME-INR
INR: 0.95 (ref 0.00–1.49)
Prothrombin Time: 12.5 seconds (ref 11.6–15.2)

## 2013-08-10 LAB — CBC
HCT: 37.2 % (ref 36.0–46.0)
Hemoglobin: 12.2 g/dL (ref 12.0–15.0)
MCH: 27.9 pg (ref 26.0–34.0)
MCHC: 32.8 g/dL (ref 30.0–36.0)
MCV: 85.1 fL (ref 78.0–100.0)
Platelets: 261 10*3/uL (ref 150–400)
RBC: 4.37 MIL/uL (ref 3.87–5.11)
RDW: 14.6 % (ref 11.5–15.5)
WBC: 15.1 10*3/uL — ABNORMAL HIGH (ref 4.0–10.5)

## 2013-08-10 LAB — APTT: aPTT: 25 seconds (ref 24–37)

## 2013-08-10 LAB — URINALYSIS, ROUTINE W REFLEX MICROSCOPIC
Bilirubin Urine: NEGATIVE
Glucose, UA: NEGATIVE mg/dL
Hgb urine dipstick: NEGATIVE
Ketones, ur: NEGATIVE mg/dL
Leukocytes, UA: NEGATIVE
Nitrite: NEGATIVE
Protein, ur: >300 mg/dL — AB
Specific Gravity, Urine: 1.022 (ref 1.005–1.030)
Urobilinogen, UA: 0.2 mg/dL (ref 0.0–1.0)
pH: 6 (ref 5.0–8.0)

## 2013-08-10 LAB — COMPREHENSIVE METABOLIC PANEL
ALT: 46 U/L — ABNORMAL HIGH (ref 0–35)
AST: 37 U/L (ref 0–37)
Albumin: 2.7 g/dL — ABNORMAL LOW (ref 3.5–5.2)
Alkaline Phosphatase: 102 U/L (ref 39–117)
BUN: 23 mg/dL (ref 6–23)
CO2: 24 mEq/L (ref 19–32)
Calcium: 9.3 mg/dL (ref 8.4–10.5)
Chloride: 108 mEq/L (ref 96–112)
Creatinine, Ser: 1.69 mg/dL — ABNORMAL HIGH (ref 0.50–1.10)
GFR calc Af Amer: 37 mL/min — ABNORMAL LOW (ref >90)
GFR calc non Af Amer: 32 mL/min — ABNORMAL LOW (ref >90)
Glucose, Bld: 104 mg/dL — ABNORMAL HIGH (ref 70–99)
Potassium: 4.6 mEq/L (ref 3.5–5.1)
Sodium: 139 mEq/L (ref 135–145)
Total Bilirubin: 0.2 mg/dL — ABNORMAL LOW (ref 0.3–1.2)
Total Protein: 7.3 g/dL (ref 6.0–8.3)

## 2013-08-10 LAB — PREPARE RBC (CROSSMATCH)

## 2013-08-10 LAB — URINE MICROSCOPIC-ADD ON

## 2013-08-10 IMAGING — CR DG CHEST 2V
2 series · 2 of 2 positions shown · non-contrast
Comparison: [DATE]

CLINICAL DATA: Preoperative femoral - popliteal bypass graft;
hypertension

CHEST - 2 VIEW

[w chest pa]
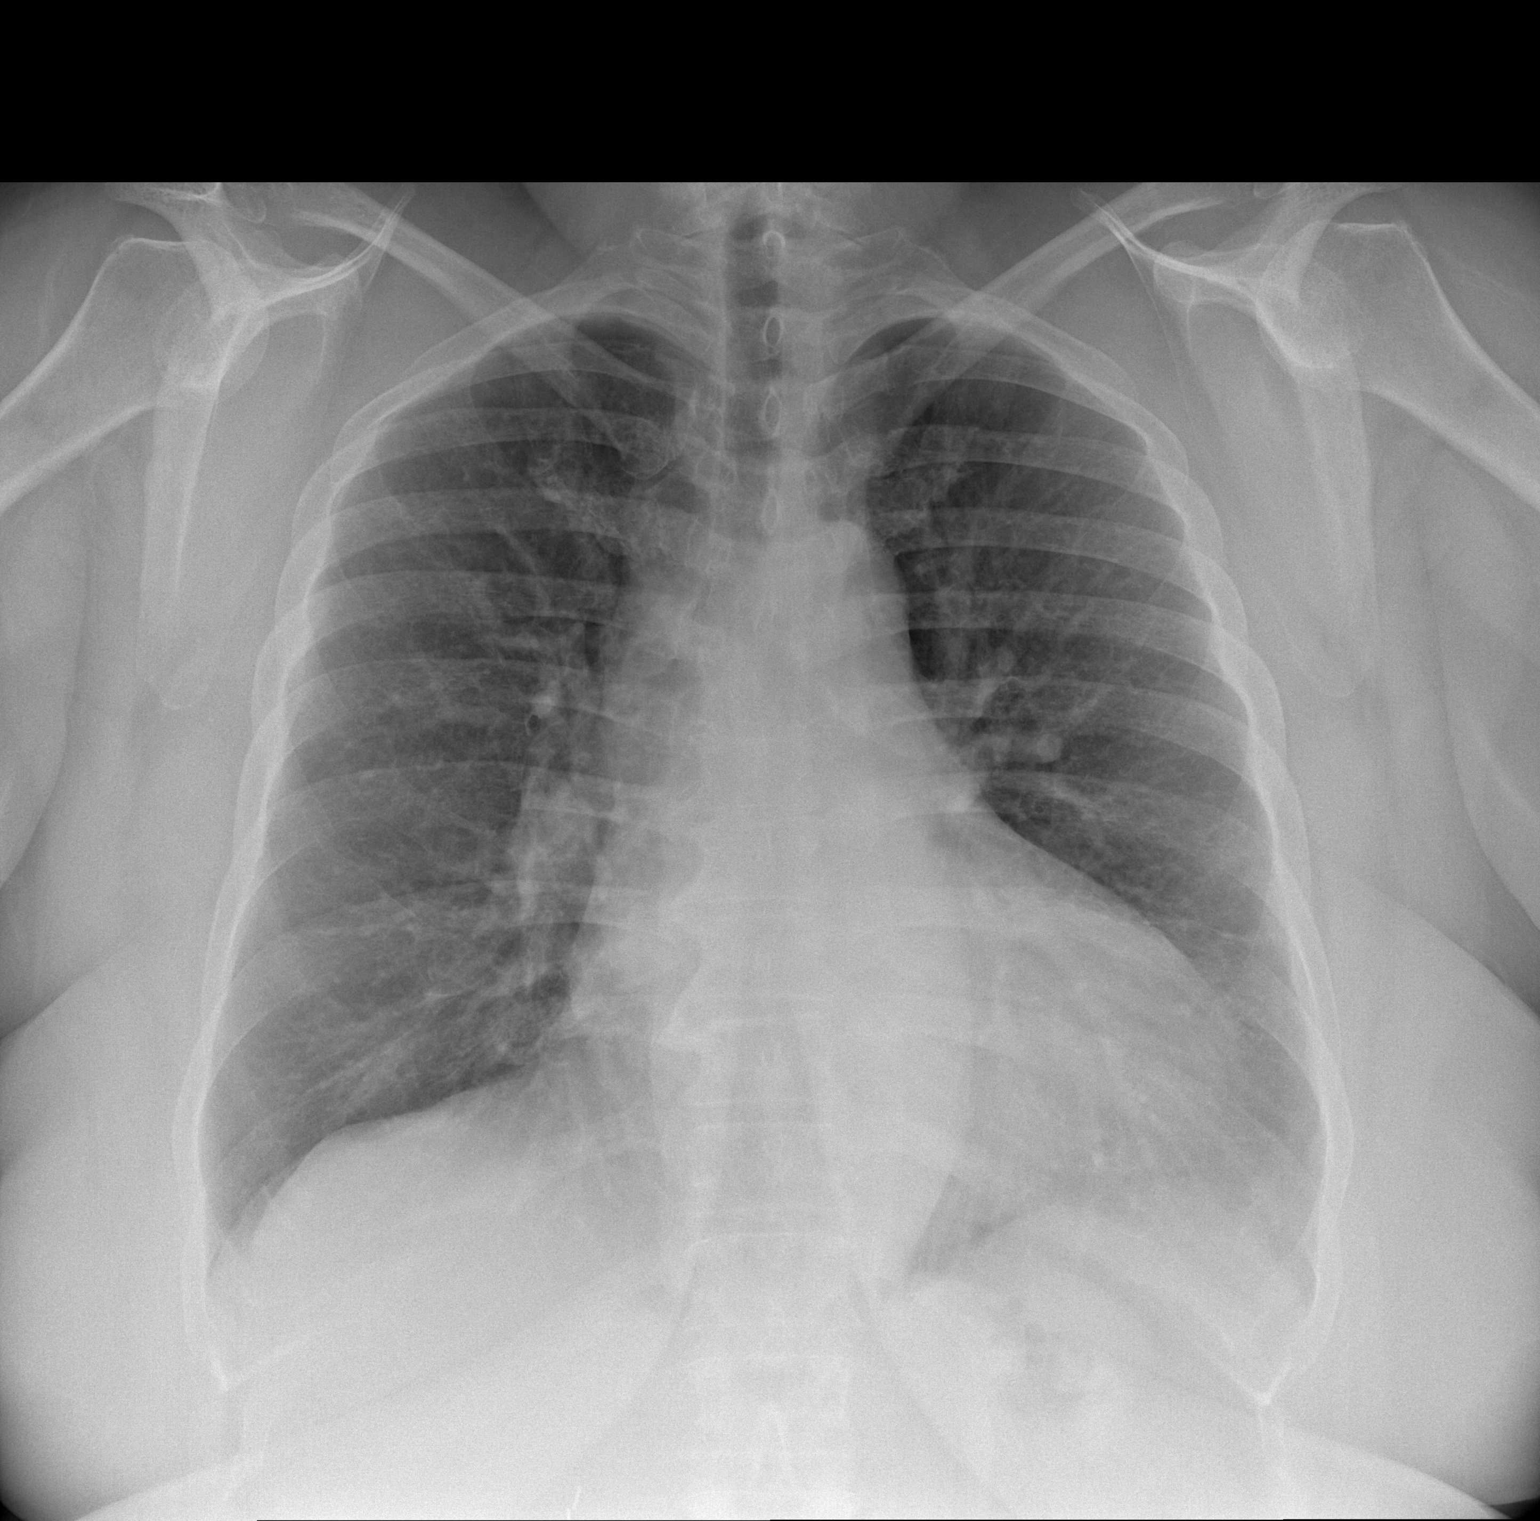

[w chest lat]
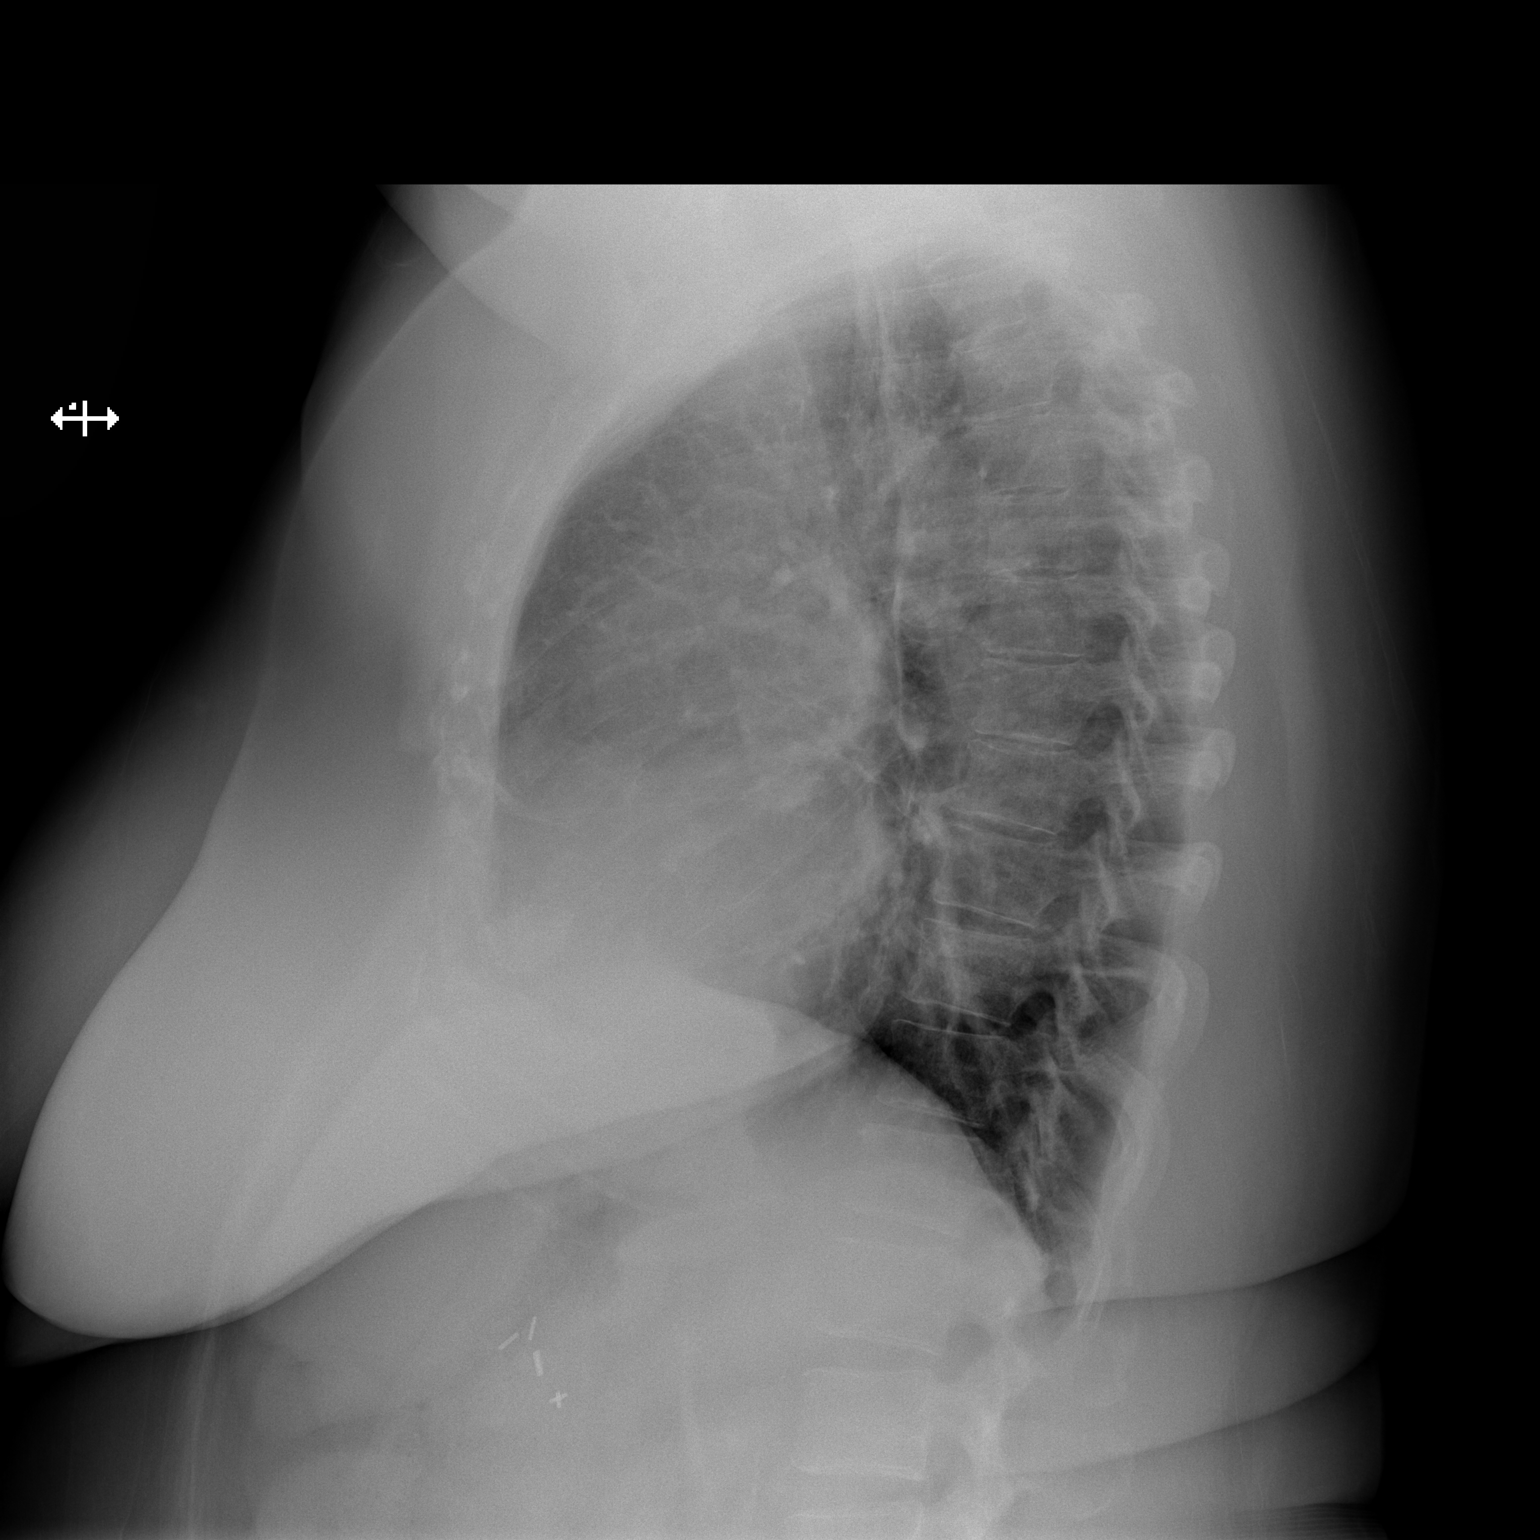

[2 of 2 positions shown; findings below may reference images not displayed]

FINDINGS: Lungs clear.  Heart is borderline enlarged with normal
pulmonary vascularity.  No adenopathy.  No bone lesions.  There is
degenerative change in the thoracic spine.
IMPRESSION: Heart is borderline enlarged.  No edema or consolidation.

## 2013-08-10 NOTE — Pre-Procedure Instructions (Signed)
EWAOLUWA HOLLISTER  08/10/2013   Your procedure is scheduled on:  08-13-2013   Friday   Report to [facility] at 7:30 AM.  Call this number if you have problems the morning of surgery: [phone]   Remember:   Do not eat food or drink liquids after midnight.    Take these medicines the morning of surgery with A SIP OF WATER: allopurinol, amlodipine(Norvasc),carvedilol(COREG),pain medication if needed    Do not wear jewelry, make-up or nail polish.  Do not wear lotions, powders, or perfumes.   Do not shave 48 hours prior to surgery.   Do not bring valuables to the hospital.  [facility] is not responsible for any belongings or valuables.  Contacts, dentures or bridgework may not be worn into surgery.   Leave suitcase in the car. After surgery it may be brought to your room.   For patients admitted to the hospital, checkout time is 11:00 AM the day of discharge.      Special Instructions: Shower using CHG 2 nights before surgery and the night before surgery.  If you shower the day of surgery use CHG.  Use special wash - you have one bottle of CHG for all showers.  You should use approximately 1/3 of the bottle for each shower.   Please read over the following fact sheets that you were given: Pain Booklet, Coughing and Deep Breathing, Blood Transfusion Information and Surgical Site Infection Prevention

## 2013-08-10 NOTE — Progress Notes (Signed)
Requested OV, cardiac clearance and any cardiac testing from Dr. Irven Shelling office.

## 2013-08-11 ENCOUNTER — Encounter: Payer: Medicare Other | Admitting: Vascular Surgery

## 2013-08-11 ENCOUNTER — Encounter (HOSPITAL_COMMUNITY): Payer: Self-pay

## 2013-08-11 ENCOUNTER — Encounter: Payer: Self-pay | Admitting: Vascular Surgery

## 2013-08-11 LAB — URINE CULTURE
Colony Count: NO GROWTH
Culture: NO GROWTH

## 2013-08-11 NOTE — Progress Notes (Signed)
Anesthesia chart review: Patient is a 57 year old female scheduled for left femoral popliteal bypass graft on 08/13/13 by Dr. Kellie Simmering.  History includes PAD with ischemic left toes, morbid obesity, smoking, COPD, hypertension, CKD stage III (Dr. Edrick Oh), anxiety, depression, colon cancer s/p colectomy '10, hysterectomy, cholecystectomy, meningitis. Cardiology notes indicate that she also has a history of OSA.  Cardiologist is Dr. Einar Gip.  She had a nuclear stress test on 08/06/13 that showed no evidence of ischemia or scar, dynamic gated images reveal normal wall motion endocardial thickening, LVEF estimated at 75%. Based on this study she was felt acceptable (low) CV morbidity and mortality for surgery.  EKG on 07/26/2012 showed normal sinus rhythm with prolonged PR interval (borderline first degree AVB), possible left atrial enlargement, septal infarct, age undetermined.  Chest x-ray on 08/10/2013 showed heart is borderline enlarged. No edema or consolidation.  Preoperative labs noted. Cr 1.69 which appears stable.  Glucose 104.  WBC 15.1.  PT/PTT WNL.  Urine culture is pending.  Patient had a recent normal stress test.  She is a smoker with OSA and morbid obesity so pulmonary risks are increased.  She will be evaluated by her assigned anesthesiologist on the day of surgery but if no acute changes then I would anticipate that she could proceed as planned from an anesthesia standpoint.  George Hugh [facility] Short Stay Center/Anesthesiology Phone [phone] 08/11/2013 4:35 PM

## 2013-08-12 MED ORDER — DEXTROSE 5 % IV SOLN
1.5000 g | INTRAVENOUS | Status: AC
  Administered 2013-08-13: 1.5 g via INTRAVENOUS
  Filled 2013-08-12: qty 1.5

## 2013-08-13 ENCOUNTER — Telehealth: Payer: Self-pay | Admitting: Vascular Surgery

## 2013-08-13 ENCOUNTER — Inpatient Hospital Stay (HOSPITAL_COMMUNITY)
Admission: RE | Admit: 2013-08-13 | Discharge: 2013-08-16 | DRG: 254 | Disposition: A | Discharge status: Home / Self Care | Payer: Medicare Other | Source: Ambulatory Visit | Attending: Vascular Surgery | Admitting: Vascular Surgery

## 2013-08-13 ENCOUNTER — Inpatient Hospital Stay (HOSPITAL_COMMUNITY): Payer: Medicare Other | Admitting: Anesthesiology

## 2013-08-13 ENCOUNTER — Encounter (HOSPITAL_COMMUNITY): Payer: Self-pay

## 2013-08-13 ENCOUNTER — Encounter (HOSPITAL_COMMUNITY)
Admission: RE | Disposition: A | Discharge status: Home / Self Care | Payer: Self-pay | Source: Ambulatory Visit | Attending: Vascular Surgery

## 2013-08-13 ENCOUNTER — Encounter (HOSPITAL_COMMUNITY): Payer: Self-pay | Admitting: Vascular Surgery

## 2013-08-13 DIAGNOSIS — F17200 Nicotine dependence, unspecified, uncomplicated: Secondary | ICD-10-CM | POA: Diagnosis present

## 2013-08-13 DIAGNOSIS — F3289 Other specified depressive episodes: Secondary | ICD-10-CM | POA: Diagnosis present

## 2013-08-13 DIAGNOSIS — K219 Gastro-esophageal reflux disease without esophagitis: Secondary | ICD-10-CM | POA: Diagnosis present

## 2013-08-13 DIAGNOSIS — Z85038 Personal history of other malignant neoplasm of large intestine: Secondary | ICD-10-CM

## 2013-08-13 DIAGNOSIS — Z808 Family history of malignant neoplasm of other organs or systems: Secondary | ICD-10-CM

## 2013-08-13 DIAGNOSIS — J449 Chronic obstructive pulmonary disease, unspecified: Secondary | ICD-10-CM | POA: Diagnosis present

## 2013-08-13 DIAGNOSIS — J4489 Other specified chronic obstructive pulmonary disease: Secondary | ICD-10-CM | POA: Diagnosis present

## 2013-08-13 DIAGNOSIS — I70219 Atherosclerosis of native arteries of extremities with intermittent claudication, unspecified extremity: Secondary | ICD-10-CM

## 2013-08-13 DIAGNOSIS — Z01812 Encounter for preprocedural laboratory examination: Secondary | ICD-10-CM

## 2013-08-13 DIAGNOSIS — F411 Generalized anxiety disorder: Secondary | ICD-10-CM | POA: Diagnosis present

## 2013-08-13 DIAGNOSIS — I739 Peripheral vascular disease, unspecified: Secondary | ICD-10-CM

## 2013-08-13 DIAGNOSIS — Z8042 Family history of malignant neoplasm of prostate: Secondary | ICD-10-CM

## 2013-08-13 DIAGNOSIS — I70269 Atherosclerosis of native arteries of extremities with gangrene, unspecified extremity: Principal | ICD-10-CM | POA: Diagnosis present

## 2013-08-13 DIAGNOSIS — M79609 Pain in unspecified limb: Secondary | ICD-10-CM

## 2013-08-13 DIAGNOSIS — Z803 Family history of malignant neoplasm of breast: Secondary | ICD-10-CM

## 2013-08-13 DIAGNOSIS — F329 Major depressive disorder, single episode, unspecified: Secondary | ICD-10-CM | POA: Diagnosis present

## 2013-08-13 DIAGNOSIS — I10 Essential (primary) hypertension: Secondary | ICD-10-CM | POA: Diagnosis present

## 2013-08-13 HISTORY — PX: FEMORAL-POPLITEAL BYPASS GRAFT: SHX937

## 2013-08-13 LAB — CBC
HCT: 36.8 % (ref 36.0–46.0)
Hemoglobin: 11.4 g/dL — ABNORMAL LOW (ref 12.0–15.0)
MCH: 26.9 pg (ref 26.0–34.0)
MCHC: 31 g/dL (ref 30.0–36.0)
MCV: 86.8 fL (ref 78.0–100.0)
Platelets: 239 10*3/uL (ref 150–400)
RBC: 4.24 MIL/uL (ref 3.87–5.11)
RDW: 15 % (ref 11.5–15.5)
WBC: 17.2 10*3/uL — ABNORMAL HIGH (ref 4.0–10.5)

## 2013-08-13 LAB — CREATININE, SERUM
Creatinine, Ser: 1.99 mg/dL — ABNORMAL HIGH (ref 0.50–1.10)
GFR calc Af Amer: 31 mL/min — ABNORMAL LOW (ref >90)
GFR calc non Af Amer: 26 mL/min — ABNORMAL LOW (ref >90)

## 2013-08-13 SURGERY — BYPASS GRAFT FEMORAL-POPLITEAL ARTERY
Anesthesia: General | Site: Leg Upper | Laterality: Left | Wound class: Clean

## 2013-08-13 MED ORDER — ROCURONIUM BROMIDE 100 MG/10ML IV SOLN
INTRAVENOUS | Status: DC | PRN
  Administered 2013-08-13: 50 mg via INTRAVENOUS
  Administered 2013-08-13: 20 mg via INTRAVENOUS

## 2013-08-13 MED ORDER — MIDAZOLAM HCL 5 MG/5ML IJ SOLN
INTRAMUSCULAR | Status: DC | PRN
  Administered 2013-08-13: 2 mg via INTRAVENOUS

## 2013-08-13 MED ORDER — ENOXAPARIN SODIUM 30 MG/0.3ML ~~LOC~~ SOLN
30.0000 mg | SUBCUTANEOUS | Status: DC
  Administered 2013-08-14 – 2013-08-15 (×2): 30 mg via SUBCUTANEOUS
  Filled 2013-08-13 (×3): qty 0.3

## 2013-08-13 MED ORDER — ACETAMINOPHEN 10 MG/ML IV SOLN
1000.0000 mg | Freq: Once | INTRAVENOUS | Status: AC
  Administered 2013-08-13: 1000 mg via INTRAVENOUS

## 2013-08-13 MED ORDER — DOPAMINE-DEXTROSE 3.2-5 MG/ML-% IV SOLN: 3.0000 ug/kg/min | INTRAVENOUS | Status: DC

## 2013-08-13 MED ORDER — ZOLPIDEM TARTRATE 5 MG PO TABS
5.0000 mg | ORAL_TABLET | Freq: Every evening | ORAL | Status: DC | PRN
  Administered 2013-08-14 – 2013-08-15 (×2): 5 mg via ORAL
  Filled 2013-08-13 (×2): qty 1

## 2013-08-13 MED ORDER — HEPARIN SODIUM (PORCINE) 1000 UNIT/ML IJ SOLN
INTRAMUSCULAR | Status: DC | PRN
  Administered 2013-08-13: 6000 [IU] via INTRAVENOUS

## 2013-08-13 MED ORDER — DEXTROSE 5 % IV SOLN
1.5000 g | Freq: Two times a day (BID) | INTRAVENOUS | Status: AC
  Administered 2013-08-13 – 2013-08-14 (×2): 1.5 g via INTRAVENOUS
  Filled 2013-08-13 (×3): qty 1.5

## 2013-08-13 MED ORDER — SODIUM CHLORIDE 0.9 % IR SOLN
Status: DC | PRN
  Administered 2013-08-13: 09:00:00

## 2013-08-13 MED ORDER — MORPHINE SULFATE 2 MG/ML IJ SOLN: 2.0000 mg | INTRAMUSCULAR | Status: DC | PRN

## 2013-08-13 MED ORDER — ALLOPURINOL 100 MG PO TABS
100.0000 mg | ORAL_TABLET | Freq: Every day | ORAL | Status: DC
  Filled 2013-08-13 (×4): qty 1

## 2013-08-13 MED ORDER — ALBUTEROL SULFATE HFA 108 (90 BASE) MCG/ACT IN AERS
INHALATION_SPRAY | RESPIRATORY_TRACT | Status: DC | PRN
  Administered 2013-08-13: 2 via RESPIRATORY_TRACT

## 2013-08-13 MED ORDER — HYDROMORPHONE HCL PF 1 MG/ML IJ SOLN
0.5000 mg | INTRAMUSCULAR | Status: DC | PRN
  Administered 2013-08-14 – 2013-08-15 (×4): 1 mg via INTRAVENOUS
  Filled 2013-08-13 (×4): qty 1

## 2013-08-13 MED ORDER — LIDOCAINE HCL (CARDIAC) 20 MG/ML IV SOLN
INTRAVENOUS | Status: DC | PRN
  Administered 2013-08-13: 40 mg via INTRAVENOUS

## 2013-08-13 MED ORDER — SERTRALINE HCL 50 MG PO TABS
50.0000 mg | ORAL_TABLET | ORAL | Status: DC
  Filled 2013-08-13 (×2): qty 1

## 2013-08-13 MED ORDER — FENTANYL CITRATE 0.05 MG/ML IJ SOLN
INTRAMUSCULAR | Status: DC | PRN
  Administered 2013-08-13: 100 ug via INTRAVENOUS
  Administered 2013-08-13: 150 ug via INTRAVENOUS

## 2013-08-13 MED ORDER — ONDANSETRON HCL 4 MG PO TABS: 4.0000 mg | ORAL_TABLET | Freq: Three times a day (TID) | ORAL | Status: DC | PRN

## 2013-08-13 MED ORDER — 0.9 % SODIUM CHLORIDE (POUR BTL) OPTIME
TOPICAL | Status: DC | PRN
  Administered 2013-08-13 (×2): 1000 mL

## 2013-08-13 MED ORDER — CARVEDILOL 12.5 MG PO TABS
12.5000 mg | ORAL_TABLET | Freq: Two times a day (BID) | ORAL | Status: DC
  Administered 2013-08-14 – 2013-08-16 (×5): 12.5 mg via ORAL
  Filled 2013-08-13 (×9): qty 1

## 2013-08-13 MED ORDER — PROPOFOL 10 MG/ML IV BOLUS
INTRAVENOUS | Status: DC | PRN
  Administered 2013-08-13: 160 mg via INTRAVENOUS

## 2013-08-13 MED ORDER — ONDANSETRON HCL 4 MG/2ML IJ SOLN: 4.0000 mg | Freq: Once | INTRAMUSCULAR | Status: DC | PRN

## 2013-08-13 MED ORDER — ROPINIROLE HCL 0.5 MG PO TABS
0.5000 mg | ORAL_TABLET | Freq: Every evening | ORAL | Status: DC | PRN
  Filled 2013-08-13 (×2): qty 1

## 2013-08-13 MED ORDER — GLYCOPYRROLATE 0.2 MG/ML IJ SOLN
INTRAMUSCULAR | Status: DC | PRN
  Administered 2013-08-13: .6 mg via INTRAVENOUS

## 2013-08-13 MED ORDER — HYDROMORPHONE HCL PF 1 MG/ML IJ SOLN
INTRAMUSCULAR | Status: AC
  Filled 2013-08-13: qty 1

## 2013-08-13 MED ORDER — HYDRALAZINE HCL 20 MG/ML IJ SOLN: 10.0000 mg | INTRAMUSCULAR | Status: DC | PRN

## 2013-08-13 MED ORDER — ACETAMINOPHEN 650 MG RE SUPP: 325.0000 mg | RECTAL | Status: DC | PRN

## 2013-08-13 MED ORDER — VITAMIN D 1000 UNITS PO TABS
5000.0000 [IU] | ORAL_TABLET | Freq: Every day | ORAL | Status: DC
  Administered 2013-08-13 – 2013-08-16 (×4): 5000 [IU] via ORAL
  Filled 2013-08-13 (×4): qty 5

## 2013-08-13 MED ORDER — HYDROMORPHONE HCL PF 1 MG/ML IJ SOLN
0.2500 mg | INTRAMUSCULAR | Status: DC | PRN
  Administered 2013-08-13 (×4): 0.5 mg via INTRAVENOUS

## 2013-08-13 MED ORDER — POTASSIUM CHLORIDE CRYS ER 20 MEQ PO TBCR: 20.0000 meq | EXTENDED_RELEASE_TABLET | Freq: Once | ORAL | Status: AC | PRN

## 2013-08-13 MED ORDER — MECLIZINE HCL 25 MG PO TABS
25.0000 mg | ORAL_TABLET | Freq: Three times a day (TID) | ORAL | Status: DC | PRN
  Filled 2013-08-13: qty 1

## 2013-08-13 MED ORDER — SODIUM CHLORIDE 0.9 % IV SOLN: 500.0000 mL | Freq: Once | INTRAVENOUS | Status: AC | PRN

## 2013-08-13 MED ORDER — OXYCODONE-ACETAMINOPHEN 5-325 MG PO TABS
1.0000 | ORAL_TABLET | Freq: Three times a day (TID) | ORAL | Status: DC | PRN
  Administered 2013-08-13: 1 via ORAL
  Filled 2013-08-13: qty 1

## 2013-08-13 MED ORDER — PANTOPRAZOLE SODIUM 40 MG PO TBEC
40.0000 mg | DELAYED_RELEASE_TABLET | Freq: Every day | ORAL | Status: DC
  Administered 2013-08-13: 40 mg via ORAL
  Filled 2013-08-13: qty 1

## 2013-08-13 MED ORDER — DOCUSATE SODIUM 100 MG PO CAPS
100.0000 mg | ORAL_CAPSULE | Freq: Every day | ORAL | Status: DC
  Filled 2013-08-13 (×2): qty 1

## 2013-08-13 MED ORDER — LACTATED RINGERS IV SOLN
INTRAVENOUS | Status: DC | PRN
  Administered 2013-08-13 (×2): via INTRAVENOUS

## 2013-08-13 MED ORDER — NEOSTIGMINE METHYLSULFATE 1 MG/ML IJ SOLN
INTRAMUSCULAR | Status: DC | PRN
  Administered 2013-08-13: 4 mg via INTRAVENOUS

## 2013-08-13 MED ORDER — SODIUM CHLORIDE 0.9 % IV SOLN
INTRAVENOUS | Status: DC
  Administered 2013-08-13: 18:00:00 via INTRAVENOUS

## 2013-08-13 MED ORDER — CARVEDILOL 6.25 MG PO TABS
6.2500 mg | ORAL_TABLET | Freq: Two times a day (BID) | ORAL | Status: AC
  Administered 2013-08-13: 6.25 mg via ORAL
  Filled 2013-08-13: qty 1

## 2013-08-13 MED ORDER — PHENOL 1.4 % MT LIQD: 1.0000 | OROMUCOSAL | Status: DC | PRN

## 2013-08-13 MED ORDER — GUAIFENESIN-DM 100-10 MG/5ML PO SYRP: 15.0000 mL | ORAL_SOLUTION | ORAL | Status: DC | PRN

## 2013-08-13 MED ORDER — LABETALOL HCL 5 MG/ML IV SOLN
10.0000 mg | INTRAVENOUS | Status: DC | PRN
  Filled 2013-08-13: qty 4

## 2013-08-13 MED ORDER — METOPROLOL TARTRATE 1 MG/ML IV SOLN: 2.0000 mg | INTRAVENOUS | Status: DC | PRN

## 2013-08-13 MED ORDER — SODIUM CHLORIDE 0.9 % IV SOLN
10.0000 mg | INTRAVENOUS | Status: DC | PRN
  Administered 2013-08-13: 15 ug/min via INTRAVENOUS

## 2013-08-13 MED ORDER — OXYCODONE-ACETAMINOPHEN 5-325 MG PO TABS
1.0000 | ORAL_TABLET | ORAL | Status: DC | PRN
  Administered 2013-08-13: 2 via ORAL
  Administered 2013-08-14: 1 via ORAL
  Administered 2013-08-14 – 2013-08-16 (×5): 2 via ORAL
  Filled 2013-08-13 (×5): qty 2
  Filled 2013-08-13: qty 1
  Filled 2013-08-13: qty 2

## 2013-08-13 MED ORDER — ALUM & MAG HYDROXIDE-SIMETH 200-200-20 MG/5ML PO SUSP
15.0000 mL | ORAL | Status: DC | PRN
  Administered 2013-08-14 (×3): 30 mL via ORAL
  Filled 2013-08-13 (×5): qty 30

## 2013-08-13 MED ORDER — LIDOCAINE HCL 4 % MT SOLN
OROMUCOSAL | Status: DC | PRN
  Administered 2013-08-13: 4 mL via TOPICAL

## 2013-08-13 MED ORDER — ACETAMINOPHEN 325 MG PO TABS: 325.0000 mg | ORAL_TABLET | ORAL | Status: DC | PRN

## 2013-08-13 MED ORDER — PROTAMINE SULFATE 10 MG/ML IV SOLN
INTRAVENOUS | Status: DC | PRN
  Administered 2013-08-13: 40 mg via INTRAVENOUS
  Administered 2013-08-13: 10 mg via INTRAVENOUS

## 2013-08-13 MED ORDER — PHENYLEPHRINE HCL 10 MG/ML IJ SOLN
INTRAMUSCULAR | Status: DC | PRN
  Administered 2013-08-13 (×4): 80 ug via INTRAVENOUS

## 2013-08-13 MED ORDER — ONDANSETRON HCL 4 MG/2ML IJ SOLN: 4.0000 mg | Freq: Four times a day (QID) | INTRAMUSCULAR | Status: DC | PRN

## 2013-08-13 MED ORDER — SODIUM CHLORIDE 0.9 % IV SOLN: INTRAVENOUS | Status: DC

## 2013-08-13 MED ORDER — DEXAMETHASONE SODIUM PHOSPHATE 4 MG/ML IJ SOLN
INTRAMUSCULAR | Status: DC | PRN
  Administered 2013-08-13: 4 mg via INTRAVENOUS

## 2013-08-13 MED ORDER — OXYCODONE-ACETAMINOPHEN 5-325 MG PO TABS: 1.0000 | ORAL_TABLET | Freq: Three times a day (TID) | ORAL | Status: DC | PRN

## 2013-08-13 MED ORDER — AMLODIPINE BESYLATE 10 MG PO TABS
10.0000 mg | ORAL_TABLET | Freq: Every day | ORAL | Status: DC
  Administered 2013-08-14 – 2013-08-16 (×3): 10 mg via ORAL
  Filled 2013-08-13 (×4): qty 1

## 2013-08-13 MED ORDER — ONDANSETRON HCL 4 MG/2ML IJ SOLN
INTRAMUSCULAR | Status: DC | PRN
  Administered 2013-08-13: 4 mg via INTRAVENOUS

## 2013-08-13 MED ORDER — ACETAMINOPHEN 10 MG/ML IV SOLN
INTRAVENOUS | Status: AC
  Filled 2013-08-13: qty 100

## 2013-08-13 SURGICAL SUPPLY — 58 items
BANDAGE ESMARK 6X9 LF (GAUZE/BANDAGES/DRESSINGS) IMPLANT
BLADE SURG ROTATE 9660 (MISCELLANEOUS) ×2 IMPLANT
BNDG ESMARK 6X9 LF (GAUZE/BANDAGES/DRESSINGS)
BOOT SUTURE AID YELLOW STND (SUTURE) IMPLANT
CANISTER SUCTION 2500CC (MISCELLANEOUS) ×2 IMPLANT
CLIP TI MEDIUM 24 (CLIP) ×2 IMPLANT
CLIP TI WIDE RED SMALL 24 (CLIP) ×2 IMPLANT
CLOTH BEACON ORANGE TIMEOUT ST (SAFETY) ×2 IMPLANT
COVER SURGICAL LIGHT HANDLE (MISCELLANEOUS) ×2 IMPLANT
DECANTER SPIKE VIAL GLASS SM (MISCELLANEOUS) IMPLANT
DERMABOND ADVANCED (GAUZE/BANDAGES/DRESSINGS) ×2
DERMABOND ADVANCED .7 DNX12 (GAUZE/BANDAGES/DRESSINGS) ×2 IMPLANT
DRAIN SNY 10X20 3/4 PERF (WOUND CARE) IMPLANT
DRAPE WARM FLUID 44X44 (DRAPE) ×2 IMPLANT
DRAPE X-RAY CASS 24X20 (DRAPES) IMPLANT
DRSG COVADERM 4X6 (GAUZE/BANDAGES/DRESSINGS) ×2 IMPLANT
DRSG COVADERM 4X8 (GAUZE/BANDAGES/DRESSINGS) ×4 IMPLANT
ELECT REM PT RETURN 9FT ADLT (ELECTROSURGICAL) ×4
ELECTRODE REM PT RTRN 9FT ADLT (ELECTROSURGICAL) ×2 IMPLANT
EVACUATOR SILICONE 100CC (DRAIN) IMPLANT
GLOVE BIO SURGEON STRL SZ 6.5 (GLOVE) ×4 IMPLANT
GLOVE BIO SURGEON STRL SZ7.5 (GLOVE) ×2 IMPLANT
GLOVE BIOGEL PI IND STRL 6.5 (GLOVE) ×2 IMPLANT
GLOVE BIOGEL PI INDICATOR 6.5 (GLOVE) ×2
GLOVE ECLIPSE 7.5 STRL STRAW (GLOVE) ×4 IMPLANT
GLOVE SS BIOGEL STRL SZ 7 (GLOVE) ×2 IMPLANT
GLOVE SUPERSENSE BIOGEL SZ 7 (GLOVE) ×2
GOWN STRL NON-REIN LRG LVL3 (GOWN DISPOSABLE) ×4 IMPLANT
GOWN STRL REIN XL XLG (GOWN DISPOSABLE) ×4 IMPLANT
INSERT FOGARTY SM (MISCELLANEOUS) ×2 IMPLANT
KIT BASIN OR (CUSTOM PROCEDURE TRAY) ×2 IMPLANT
KIT ROOM TURNOVER OR (KITS) ×2 IMPLANT
NS IRRIG 1000ML POUR BTL (IV SOLUTION) ×4 IMPLANT
PACK PERIPHERAL VASCULAR (CUSTOM PROCEDURE TRAY) ×2 IMPLANT
PAD ARMBOARD 7.5X6 YLW CONV (MISCELLANEOUS) ×4 IMPLANT
PADDING CAST COTTON 6X4 STRL (CAST SUPPLIES) IMPLANT
SET COLLECT BLD 21X3/4 12 (NEEDLE) IMPLANT
STOPCOCK 4 WAY LG BORE MALE ST (IV SETS) IMPLANT
SUT PROLENE 6 0 BV (SUTURE) IMPLANT
SUT PROLENE 6 0 CC (SUTURE) ×6 IMPLANT
SUT PROLENE 7 0 BV 1 (SUTURE) ×4 IMPLANT
SUT PROLENE 7 0 BV1 MDA (SUTURE) IMPLANT
SUT SILK 2 0 SH (SUTURE) ×2 IMPLANT
SUT SILK 3 0 (SUTURE)
SUT SILK 3-0 18XBRD TIE 12 (SUTURE) IMPLANT
SUT SILK 4 0 (SUTURE) ×1
SUT SILK 4-0 18XBRD TIE 12 (SUTURE) ×1 IMPLANT
SUT VIC AB 2-0 CTX 36 (SUTURE) ×4 IMPLANT
SUT VIC AB 3-0 SH 27 (SUTURE) ×4
SUT VIC AB 3-0 SH 27X BRD (SUTURE) ×4 IMPLANT
SUT VIC AB 3-0 X1 27 (SUTURE) IMPLANT
SUT VICRYL 4-0 PS2 18IN ABS (SUTURE) ×6 IMPLANT
TOWEL OR 17X24 6PK STRL BLUE (TOWEL DISPOSABLE) ×6 IMPLANT
TOWEL OR 17X26 10 PK STRL BLUE (TOWEL DISPOSABLE) ×4 IMPLANT
TRAY FOLEY CATH 16FRSI W/METER (SET/KITS/TRAYS/PACK) ×2 IMPLANT
TUBING EXTENTION W/L.L. (IV SETS) IMPLANT
UNDERPAD 30X30 INCONTINENT (UNDERPADS AND DIAPERS) ×2 IMPLANT
WATER STERILE IRR 1000ML POUR (IV SOLUTION) ×2 IMPLANT

## 2013-08-13 NOTE — Progress Notes (Signed)
Utilization review completed.  

## 2013-08-13 NOTE — Transfer of Care (Signed)
Immediate Anesthesia Transfer of Care Note  Patient: Whitney Walker  Procedure(s) Performed: Procedure(s): BYPASS GRAFT FEMORAL-POPLITEAL ARTERY WITH NON-REVERSED SAPHANEOUS VEIN; ULTRASOUND GUIDED (Left)  Patient Location: PACU  Anesthesia Type:General  Level of Consciousness: awake, alert  and oriented  Airway & Oxygen Therapy: Patient Spontanous Breathing, Patient connected to nasal cannula oxygen and Patient connected to face mask oxygen  Post-op Assessment: Report given to PACU RN and Post -op Vital signs reviewed and stable  Post vital signs: Reviewed and stable  Complications: No apparent anesthesia complications

## 2013-08-13 NOTE — Anesthesia Preprocedure Evaluation (Signed)
Anesthesia Evaluation  Patient identified by MRN, date of birth, ID band Patient awake    Reviewed: Allergy & Precautions, H&P , NPO status , [name] Chart, lab work & pertinent test results  Airway Mallampati: II      Dental  (+) Edentulous Upper and Edentulous Lower   Pulmonary  breath sounds clear to auscultation        Cardiovascular Rhythm:Regular Rate:Normal     Neuro/Psych    GI/Hepatic   Endo/Other    Renal/GU      Musculoskeletal   Abdominal (+) + obese,   Peds  Hematology   Anesthesia Other Findings   Reproductive/Obstetrics                           Anesthesia Physical Anesthesia Plan  ASA: III  Anesthesia Plan: General   Post-op Pain Management:    Induction: Intravenous  Airway Management Planned: Oral ETT  Additional Equipment:   Intra-op Plan:   Post-operative Plan: Extubation in OR  Informed Consent: I have reviewed the patients History and Physical, chart, labs and discussed the procedure including the risks, benefits and alternatives for the proposed anesthesia with the patient or authorized representative who has indicated his/her understanding and acceptance.     Plan Discussed with: CRNA and Anesthesiologist  Anesthesia Plan Comments: (PVD L. LE Morbid obesity OSA not using C-PAP  Renal insufficiency Cr 1.69 (-) nuclear stress test 08/06/14 EF 73%   Plan GA with oral ETT  Roberts Gaudy, MD)        Anesthesia Quick Evaluation

## 2013-08-13 NOTE — H&P (View-Only) (Signed)
Subjective:     Patient ID: Whitney Walker, female   DOB: 03/29/1956, 57 y.o.   MRN: [id number]  HPI 57 year old female returns to discuss further treatment regarding her ischemic left fourth and fifth toes. This occurred several weeks ago and she has been having significant pain and no status. There has been blistering. I saw her in the emergency department a few weeks ago and arrange for angiography by Dr. Lurline Hare. He was unable to perform percutaneous revascularization and she now returns to discuss left femoral-popliteal bypass grafting. She has no definite history of coronary artery disease but does have an abnormal EKG. She has occasional chest discomfort as well as dyspnea on exertion, orthopnea, leg pain, and weakness in her arms and legs, and dizziness.  Past Medical History  Diagnosis Date  . Colon cancer   . Anxiety   . Arthritis   . COPD (chronic obstructive pulmonary disease)   . Depression   . GERD (gastroesophageal reflux disease)   . Hypertension   . Meningitis   . Vertigo     History  Substance Use Topics  . Smoking status: Current Every Day Smoker -- 1.00 packs/day for 40 years    Types: Cigarettes  . Smokeless tobacco: Never Used  . Alcohol Use: No    Family History  Problem Relation Age of Onset  . Cancer Mother     breast and bone  . Cancer Father     prostate    Allergies  Allergen Reactions  . Iohexol      Code: HIVES, Desc: HIVES W/ "DYE" USED FOR 1ST CT SCAN BUT NOT 2ND, NO PREMEDS USED, PT UNCERTAIN OF CIRCUMSTANCES,,?POSSIBLE MRI CONTRAST ALLERGY, ALL STUDIES DONE "SOMEWHERE" IN [place]//[place] Onset Date: [id number]     Current outpatient prescriptions:allopurinol (ZYLOPRIM) 100 MG tablet, Take 100 mg by mouth daily., Disp: , Rfl: ;  amLODipine (NORVASC) 10 MG tablet, Take 10 mg by mouth daily., Disp: , Rfl: ;  carvedilol (COREG) 6.25 MG tablet, Take 12.5 mg by mouth 2 (two) times daily with a meal., Disp: , Rfl: ;  Cholecalciferol (VITAMIN D-3)  5000 UNITS TABS, Take 5,000 Units by mouth daily., Disp: , Rfl:  ondansetron (ZOFRAN) 4 MG tablet, Take 1 tablet (4 mg total) by mouth every 8 (eight) hours as needed for nausea., Disp: 10 tablet, Rfl: 0;  oxyCODONE-acetaminophen (PERCOCET/ROXICET) 5-325 MG per tablet, Take 1 tablet by mouth every 8 (eight) hours as needed for pain., Disp: , Rfl: ;  rOPINIRole (REQUIP) 0.5 MG tablet, Take 0.5 mg by mouth at bedtime as needed (RLS). , Disp: , Rfl:  meclizine (ANTIVERT) 25 MG tablet, Take 25 mg by mouth 3 (three) times daily as needed for dizziness or nausea., Disp: , Rfl: ;  sertraline (ZOLOFT) 50 MG tablet, Take 50 mg by mouth every other day. , Disp: , Rfl:   BP 140/98  Pulse 92  Resp 22  Ht 5\' 3"  (1.6 m)  Wt 243 lb 3.2 oz (110.315 kg)  BMI 43.09 kg/m2  Body mass index is 43.09 kg/(m^2).        Review of Systems as noted above the patient complains of chest discomfort on occasion, dyspnea on exertion, orthopnea, leg discomfort, chills, dizziness, and weakness in her arms and legs. All other systems negative complete review of systems    Objective:   Physical Exam blood pressure 140/98 heart rate is 92 respirations 20 Gen.-alert and oriented x3 in no apparent distress-obese HEENT normal for age Lungs no rhonchi or  wheezing Cardiovascular regular rhythm no murmurs carotid pulses 3+ palpable no bruits audible Abdomen soft nontender no palpable masses-obese Musculoskeletal free of  major deformities Skin clear -no rashes Neurologic normal Lower extremities 3+ femoral and absent popliteal and pedal pulses left foot. There are ischemic changes left fourth and fifth toes with some dry blistering of the fifth toe but no active infection. No gangrene is noted.      Assessment:     Left femoral-popliteal occlusive disease with patent above-knee left popliteal artery and three-vessel runoff-ischemic left fourth and fifth toes Patient has been followed by Dr. Clayton Lefort and she will be  seeing him early next week for cardiac clearance.    Plan:     Plan left femoral-popliteal bypass graft on Friday, August 15 using the saphenous vein or PTFE depending on quality of the vein Discussed this with patient and she does understand that there is no guarantee that this will be successful and that she will continue to have some pain in her fourth and fifth toes as they heal  She would like to proceed as outlined above

## 2013-08-13 NOTE — Op Note (Signed)
OPERATIVE REPORT  Date of Surgery: 08/13/2013  Surgeon: Tinnie Gens, MD  Assistant: Dionicio Stall  Pre-op Diagnosis: Left superficial femoral occlusion with ischemia of left fourth and fifth toes  Post-op Diagnosis: Same Procedure: Procedure(s): Left common femoral to popliteal (above-knee) bypass using non-reversed translocated saphenous vein graft from left leg  Anesthesia: General  EBL: Gen.  Complications: None  The patient was taken to the operating room placed in supine position at which time satisfactory general endotracheal anesthesia was administered. The left leg was prepped and draped in routine sterile manner. Saphenous vein was exposed it saphenofemoral junction was of adequate caliber. It was then removed down to the knee through 3 separate incisions on the medial aspect of the left leg. He was an excellent vein. Branches were ligated with 4-0 silk ties and divided. It was transected distally and proximally with the stump being ligated with 0 silk ties proximally at the saphenofemoral junction. It was gently dilated with heparinized saline marked for orientation purposes. His maximum vein. The common superficial and profunda femoris arteries were dissected free and the inguinal incision. Common femoral artery had an excellent pulse was relatively normal in appearance and superficial femoral artery was severely diseased. Through the distal vein harvesting incision the popliteal artery was exposed just distal to the abductor canal through medial approach. It was a soft relatively normal appearing vessel and widely patent on angiogram. A subfascial anatomic tunnel was created and the patient was heparinized. Femoral vessels were occluded with vascular clamps a longitudinal opening made in the common femoral artery with a 15 blade and extended with Potts scissors. There was excellent inflow. Proximally the vein was spatulated and anastomosed end-to-side with 6-0 Prolene. Following  completion of this the clamps were released and there was a good pulse in the first set of competent valves. Using the retrograde valvulotome the valves were rendered incompetent with resultant excellent flow at the distal end of the vein graft. Vein was carefully delivered through the tunnel preserving its orientation popliteal artery occluded proximally and distally in the above-knee position of the 15 blade extended with Potts scissors. It was a foreign half to 5 mm vessel which was patent across the knee joint. Vein was carefully measured spatulated and anastomosed end to side with 6-0 Prolene. Clamps were then released there was an excellent pulse in the vein graft and a palpable dorsalis pedis pulse in the foot. Excellent Doppler flow was present. Because of a history of a dye allergy intraoperative arteriogram was not performed. Protamine was given to reverse the heparin following adequate hemostasis the wounds are irrigated with saline closed in layers with Vicryl in a subcuticular fashion sterile dressing applied patient taken to recovery room in stable condition  Procedure Details:   Tinnie Gens, MD 08/13/2013 10:38 AM

## 2013-08-13 NOTE — Telephone Encounter (Addendum)
Message copied by Doristine Section on Fri Aug 13, 2013 11:37 AM ------      Message from: Mena Goes      Created: Fri Aug 13, 2013 11:04 AM      Regarding: schedule                   ----- Message -----         From: Gabriel Earing, PA-C         Sent: 08/13/2013  10:33 AM           To: Mena Goes, CMA            S/p left fem pop bypass 08/13/13.  F/u with Dr. Kellie Simmering in 2 weeks.            Thanks,      Aldona Bar ------  notified patient of fu appt. on 08-31-13 8:45 am

## 2013-08-13 NOTE — Interval H&P Note (Signed)
History and Physical Interval Note:  08/13/2013 7:22 AM  Whitney Walker  has presented today for surgery, with the diagnosis of PVD W/ ISCHEMIC TOES  The various methods of treatment have been discussed with the patient and family. After consideration of risks, benefits and other options for treatment, the patient has consented to  Procedure(s): BYPASS GRAFT FEMORAL-POPLITEAL ARTERY WITH SAPHANEOUS VEIN OR PTFE (Left) as a surgical intervention .  The patient's history has been reviewed, patient examined, no change in status, stable for surgery.  I have reviewed the patient's chart and labs.  Questions were answered to the patient's satisfaction.     Tinnie Gens

## 2013-08-13 NOTE — Anesthesia Postprocedure Evaluation (Signed)
  Anesthesia Post-op Note  Patient: Whitney Walker  Procedure(s) Performed: Procedure(s): BYPASS GRAFT FEMORAL-POPLITEAL ARTERY WITH NON-REVERSED SAPHANEOUS VEIN; ULTRASOUND GUIDED (Left)  Patient Location: PACU  Anesthesia Type:General  Level of Consciousness: awake, alert  and oriented  Airway and Oxygen Therapy: Patient Spontanous Breathing and Patient connected to nasal cannula oxygen  Post-op Pain: mild  Post-op Assessment: Post-op Vital signs reviewed, [name] Cardiovascular Status Stable, Respiratory Function Stable, Patent Airway and Pain level controlled  Post-op Vital Signs: stable  Complications: No apparent anesthesia complications

## 2013-08-13 NOTE — Preoperative (Signed)
Beta Blockers   Reason not to administer Beta Blockers:received coreg today

## 2013-08-14 DIAGNOSIS — M79609 Pain in unspecified limb: Secondary | ICD-10-CM

## 2013-08-14 LAB — BASIC METABOLIC PANEL
BUN: 34 mg/dL — ABNORMAL HIGH (ref 6–23)
CO2: 21 mEq/L (ref 19–32)
Calcium: 8.6 mg/dL (ref 8.4–10.5)
Chloride: 103 mEq/L (ref 96–112)
Creatinine, Ser: 2.24 mg/dL — ABNORMAL HIGH (ref 0.50–1.10)
GFR calc Af Amer: 27 mL/min — ABNORMAL LOW (ref >90)
GFR calc non Af Amer: 23 mL/min — ABNORMAL LOW (ref >90)
Glucose, Bld: 129 mg/dL — ABNORMAL HIGH (ref 70–99)
Potassium: 5.3 mEq/L — ABNORMAL HIGH (ref 3.5–5.1)
Sodium: 135 mEq/L (ref 135–145)

## 2013-08-14 LAB — TYPE AND SCREEN
ABO/RH(D): B POS
Antibody Screen: NEGATIVE
Unit division: 0
Unit division: 0

## 2013-08-14 LAB — CBC
HCT: 34.3 % — ABNORMAL LOW (ref 36.0–46.0)
Hemoglobin: 11.2 g/dL — ABNORMAL LOW (ref 12.0–15.0)
MCH: 27.7 pg (ref 26.0–34.0)
MCHC: 32.7 g/dL (ref 30.0–36.0)
MCV: 84.9 fL (ref 78.0–100.0)
Platelets: 248 10*3/uL (ref 150–400)
RBC: 4.04 MIL/uL (ref 3.87–5.11)
RDW: 14.7 % (ref 11.5–15.5)
WBC: 18.1 10*3/uL — ABNORMAL HIGH (ref 4.0–10.5)

## 2013-08-14 NOTE — Progress Notes (Addendum)
[facility] [place]  Progress Note Bypass Surgery  Date of Surgery: 08/13/2013  Procedure(s): Left BYPASS GRAFT FEMORAL-POPLITEAL ARTERY WITH NON-REVERSED SAPHANEOUS VEIN; ULTRASOUND GUIDED Surgeon: Surgeon(s): Mal [name], MD  1 Day Post-Op  History of Present Illness  Whitney [name] is a 57 y.o. female who is  1 Day Post-Op. The patient's pre-op symptoms  are Improved . Patients pain is well controlled.    VASC. LAB Studies:        ABI: pend   Significant Diagnostic Studies: CBC Lab Results  Component Value Date   WBC 18.1* 08/14/2013   HGB 11.2* 08/14/2013   HCT 34.3* 08/14/2013   MCV 84.9 08/14/2013   PLT 248 08/14/2013    BMET    Component Value Date/Time   NA 135 08/14/2013 0415   NA 140 12/04/2012 1056   K 5.3* 08/14/2013 0415   K 4.8 12/04/2012 1056   CL 103 08/14/2013 0415   CL 111* 12/04/2012 1056   CO2 21 08/14/2013 0415   CO2 21* 12/04/2012 1056   GLUCOSE 129* 08/14/2013 0415   GLUCOSE 97 12/04/2012 1056   BUN 34* 08/14/2013 0415   BUN 22.0 12/04/2012 1056   CREATININE 2.24* 08/14/2013 0415   CREATININE 1.7* 12/04/2012 1056   CALCIUM 8.6 08/14/2013 0415   CALCIUM 9.3 12/04/2012 1056   GFRNONAA 23* 08/14/2013 0415   GFRAA 27* 08/14/2013 0415    COAG Lab Results  Component Value Date   INR 0.95 08/10/2013   INR 1.03 12/08/2009   No results found for this basename: PTT    Physical Examination  BP Readings from Last 3 Encounters:  08/14/13 117/53  08/14/13 117/53  08/10/13 148/70   Temp Readings from Last 3 Encounters:  08/14/13 98.6 F (37 C) Oral  08/14/13 98.6 F (37 C) Oral  08/10/13 98.6 F (37 C) Oral   SpO2 Readings from Last 3 Encounters:  08/14/13 99%  08/14/13 99%  08/10/13 93%   Pulse Readings from Last 3 Encounters:  08/14/13 77  08/14/13 77  08/10/13 78    Pt is A&O x 3 left lower extremity: Incision/s is/are clean,dry.intact, and  healing without hematoma, erythema or drainage Limb is warm; with good  color Dry gangrene small toe on left stable  Left Dorsalis Pedis pulse is palpable Left Posterior tibial pulse is  palpable  Assessment/Plan: Pt. Doing well Post-op pain is controlled Wounds are healing well PT/OT for ambulation Continue wound care as ordered WBC 18K - may be sec to post-op   urine culture 08/10/13 no growth  Whitney [name]  08/14/2013 9:09 AM        I have examined the patient, reviewed and agree with above.2+ DPpulse. Transfer to 2W. Mobilize  Gaylon Melchor, MD 08/14/2013 9:26 AM

## 2013-08-14 NOTE — Progress Notes (Signed)
VASCULAR LAB PRELIMINARY  ARTERIAL  ABI completed:    RIGHT    LEFT    PRESSURE WAVEFORM  PRESSURE WAVEFORM  BRACHIAL 143 triphasic BRACHIAL  triphasic  DP   DP    AT 156 triphasic AT 141 triphasic  PT 155 triphasic PT 147 triphasic  PER   PER    GREAT TOE  NA GREAT TOE  NA    RIGHT LEFT  ABI >1.0 >1.0     Verdis Bassette, RVT 08/14/2013, 10:23 AM

## 2013-08-14 NOTE — Progress Notes (Signed)
Patient being transferred to 2W bed 13 via wheelchair. Phone report called to [facility]. Patient is aware of the transfer.

## 2013-08-14 NOTE — Evaluation (Signed)
Physical Therapy Evaluation Patient Details Name: Whitney [name] MRN: [id number] DOB: 1956-02-22 Today's Date: 08/14/2013 Time: [id number] PT Time Calculation (min): 23 min  PT Assessment / Plan / Recommendation History of Present Illness  Patient is a 57 y/o female admitted s/p left fem-pop bypass due to pain and ischemia right fourth and fifth toes.  PMH positive for colon CA, arthritis, COPD, Depression, HTN, memingitis, and vertigo.  Clinical Impression  Presents with decreased mobility due to deficits listed below and will benefit from skilled PT in the acute setting to allow d/c home with intermittent family assist and HHPT.    PT Assessment  Patient needs continued PT services    Follow Up Recommendations  Home health PT    Does the patient have the potential to tolerate intense rehabilitation    N/A  Barriers to Discharge  None      Equipment Recommendations  Rolling walker with 5" wheels    Recommendations for Other Services   None  Frequency Min 3X/week    Precautions / Restrictions Precautions Precautions: None   Pertinent Vitals/Pain Left leg 8/10, RN medicated      Mobility  Bed Mobility Bed Mobility: Not assessed Details for Bed Mobility Assistance: up in recliner Transfers Transfers: Sit to Stand;Stand to Sit Sit to Stand: 5: Supervision;From chair/3-in-1 Stand to Sit: 5: Supervision;To chair/3-in-1 Details for Transfer Assistance: supervision for safety Ambulation/Gait Ambulation/Gait Assistance: 5: Supervision Ambulation Distance (Feet): 400 Feet Assistive device: Rolling walker Ambulation/Gait Assistance Details: painful on left Gait Pattern: Step-through pattern;Antalgic;Wide base of support    Exercises  Encouraged ankle pumps throughout the day   PT Diagnosis: Difficulty walking;Acute pain  PT Problem List: Decreased mobility;Pain;Decreased activity tolerance;Decreased strength PT Treatment Interventions: DME instruction;Gait  training;Functional mobility training;Therapeutic activities;Therapeutic exercise;Patient/family education     PT Goals(Current goals can be found in the care plan section) Acute Rehab PT Goals Patient Stated Goal: To return home with intermittet help from daughters PT Goal Formulation: With patient Time For Goal Achievement: 08/21/13 Potential to Achieve Goals: Good  Visit Information  Last PT Received On: 08/14/13 Assistance Needed: +1 History of Present Illness: Patient is a 57 y/o female admitted s/p left fem-pop bypass due to pain and ischemia right fourth and fifth toes.  PMH positive for colon CA, arthritis, COPD, Depression, HTN, memingitis, and vertigo.       Prior [place] expects to be discharged to:: Private residence Living Arrangements: Alone Available Help at Discharge: Family;Available PRN/intermittently Type of Home: Apartment Home Access: Level entry Home Layout: One level Home Equipment: None Prior Function Level of Independence: Independent Comments: driving but not working Communication Communication: No difficulties    Solicitor Arousal/Alertness: Awake/alert Behavior During Therapy: WFL for tasks assessed/performed Overall Cognitive Status: Within Functional Limits for tasks assessed    Extremity/Trunk Assessment Lower Extremity Assessment Lower Extremity Assessment: RLE deficits/detail;LLE deficits/detail RLE Deficits / Details: WFL LLE Deficits / Details: noted blister underside fourth toe, lateral ulcer on fifth toe, moves antigravity at ankle, hip and knee, strength not formally assessed due to pain LLE: Unable to fully assess due to pain Cervical / Trunk Assessment Cervical / Trunk Assessment: Normal   Balance Balance Balance Assessed: Yes Static Standing Balance Static Standing - Balance Support: No upper extremity supported Static Standing - Level of Assistance: 5: Stand by assistance Static  Standing - Comment/# of Minutes: standing in hallway hands off walker momentarily  End of Session PT - End of Session Equipment Utilized  During Treatment: Gait belt Activity Tolerance: Patient tolerated treatment well Patient left: in chair;with call bell/phone within reach  GP     [facility] 08/14/2013, 12:40 PM [place], [place] 08/14/2013

## 2013-08-14 NOTE — Progress Notes (Addendum)
Patient experiencing chest discomfort, mainly like heartburn. "Feels like it is coming up in my chest". Patient did refuse her protonix earlier this morning. Maalox 30cc's has been given. Continues to experience heartburn.

## 2013-08-15 MED ORDER — SODIUM CHLORIDE 0.9 % IJ SOLN
3.0000 mL | Freq: Two times a day (BID) | INTRAMUSCULAR | Status: DC
  Administered 2013-08-15: 3 mL via INTRAVENOUS

## 2013-08-15 NOTE — Progress Notes (Addendum)
[facility] [place]  Progress Note Bypass Surgery  Date of Surgery: 08/13/2013  Procedure(s): BYPASS GRAFT FEMORAL-POPLITEAL ARTERY WITH NON-REVERSED SAPHANEOUS VEIN; ULTRASOUND GUIDED Surgeon: Surgeon(s): Mal [name], MD  2 Days Post-Op  History of Present Illness  Whitney Walker is a 57 y.o. female who is  2 Days Post-Op. The patient's pre-op symptoms are Improved . Patients pain is fairly well controlled.  Still requiring IV pain meds. Ambulating in halls.   VASC. LAB Studies:        ABI: Right >1;  Left >1;  Physical Examination  BP Readings from Last 3 Encounters:  08/15/13 145/78  08/15/13 145/78  08/10/13 148/70   Temp Readings from Last 3 Encounters:  08/15/13 98.7 F (37.1 C) Oral  08/15/13 98.7 F (37.1 C) Oral  08/10/13 98.6 F (37 C) Oral   SpO2 Readings from Last 3 Encounters:  08/15/13 93%  08/15/13 93%  08/10/13 93%   Pulse Readings from Last 3 Encounters:  08/15/13 72  08/15/13 72  08/10/13 78    Pt is A&O x 3 left lower extremity: Incision/s is/are clean,dry.intact, and  healing without hematoma, erythema or drainage Limb is warm; with good color  Assessment/Plan: Pt. Doing well Post-op pain is controlled but still requiring IV dilaudid Wounds are healing well - keep left groin dry Ambulating well Continue wound care as ordered Recheck Labs in am - WBC 18K in afebrile Pt. Urine C&S pre-op - no growth Home once off dilaudid  ROCZNIAK,REGINA J  08/15/2013 8:30 AM   I have examined the patient, reviewed and agree with above. Continuing to mobilize. Palpable dorsalis pedis pulse. Has some numbness over her anterior thigh and explained this is related to the groin incision.  EARLY, TODD, MD 08/15/2013 9:20 AM

## 2013-08-16 ENCOUNTER — Encounter (HOSPITAL_COMMUNITY): Payer: Self-pay | Admitting: Vascular Surgery

## 2013-08-16 LAB — CBC
HCT: 33.7 % — ABNORMAL LOW (ref 36.0–46.0)
Hemoglobin: 10.7 g/dL — ABNORMAL LOW (ref 12.0–15.0)
MCH: 27.3 pg (ref 26.0–34.0)
MCHC: 31.8 g/dL (ref 30.0–36.0)
MCV: 86 fL (ref 78.0–100.0)
Platelets: 276 10*3/uL (ref 150–400)
RBC: 3.92 MIL/uL (ref 3.87–5.11)
RDW: 15.3 % (ref 11.5–15.5)
WBC: 16.7 10*3/uL — ABNORMAL HIGH (ref 4.0–10.5)

## 2013-08-16 LAB — BASIC METABOLIC PANEL
BUN: 36 mg/dL — ABNORMAL HIGH (ref 6–23)
CO2: 21 mEq/L (ref 19–32)
Calcium: 9.1 mg/dL (ref 8.4–10.5)
Chloride: 107 mEq/L (ref 96–112)
Creatinine, Ser: 1.72 mg/dL — ABNORMAL HIGH (ref 0.50–1.10)
GFR calc Af Amer: 37 mL/min — ABNORMAL LOW (ref >90)
GFR calc non Af Amer: 32 mL/min — ABNORMAL LOW (ref >90)
Glucose, Bld: 115 mg/dL — ABNORMAL HIGH (ref 70–99)
Potassium: 5.1 mEq/L (ref 3.5–5.1)
Sodium: 136 mEq/L (ref 135–145)

## 2013-08-16 MED ORDER — OXYCODONE-ACETAMINOPHEN 5-325 MG PO TABS: 1.0000 | ORAL_TABLET | ORAL | Status: DC | PRN

## 2013-08-16 NOTE — Progress Notes (Signed)
Patient ID: Whitney Walker, female   DOB: 08-24-1956, 57 y.o.   MRN: [id number] Vascular Surgery Progress Note  Subjective: 3 days post left femoral-popliteal bypass graft for ischemic fourth and fifth toes. Patient states does feel much better today with decreased pain Ambulating without assistance  Objective:  Filed Vitals:   08/16/13 0732  BP: 150/80  Pulse: 82  Temp:   Resp:     General alert and oriented x3 Incisions left leg healing nicely with 3+ dorsalis pedis pulse palpable Minimal distal edema   Labs:  Recent Labs Lab 08/10/13 1400 08/13/13 1910 08/14/13 0415  CREATININE 1.69* 1.99* 2.24*    Recent Labs Lab 08/10/13 1400 08/13/13 1910 08/14/13 0415  NA 139  --  135  K 4.6  --  5.3*  CL 108  --  103  CO2 24  --  21  BUN 23  --  34*  CREATININE 1.69* 1.99* 2.24*  GLUCOSE 104*  --  129*  CALCIUM 9.3  --  8.6    Recent Labs Lab 08/13/13 1910 08/14/13 0415 08/16/13 0500  WBC 17.2* 18.1* 16.7*  HGB 11.4* 11.2* 10.7*  HCT 36.8 34.3* 33.7*  PLT 239 248 276    Recent Labs Lab 08/10/13 1400  INR 0.95    I/O last 3 completed shifts: In: 240 [P.O.:240] Out: -   Imaging: No results found.  Assessment/Plan:  POD #3  LOS: 3 days  s/p Procedure(s): BYPASS GRAFT FEMORAL-POPLITEAL ARTERY WITH NON-REVERSED SAPHANEOUS VEIN; ULTRASOUND GUIDED  Will recheck been met today as creatinine is slightly up at 2.2 with baseline of 1.7 Plan DC home today Followup in office with me in 2 weeks   Tinnie Gens, MD 08/16/2013 8:52 AM

## 2013-08-16 NOTE — Progress Notes (Signed)
Physical Therapy Treatment Patient Details Name: Whitney Walker MRN: [id number] DOB: Oct 13, 1956 Today's Date: 08/16/2013 Time: 1010-1034 PT Time Calculation (min): 24 min  PT Assessment / Plan / Recommendation  History of Present Illness Patient is a 57 y/o female admitted s/p left fem-pop bypass due to pain and ischemia right fourth and fifth toes.  PMH positive for colon CA, arthritis, COPD, Depression, HTN, memingitis, and vertigo.   PT Comments   Whitney Walker demonstrates steady progress towards PT goals at this time. She was able to tolerate ambulation despite increased pain. Educated patient on technique for car transfers.  Additionally, provided patient with techniques for edema control and spoke at length regarding proper positioning with LE elevation.  Anticipate patient will be safe for dc home with intermittent assist from family and HHPT.   Follow Up Recommendations  Home health PT           Equipment Recommendations  Rolling walker with 5" wheels       Frequency Min 3X/week   Progress towards PT Goals  Progressing towards goals  Plan Current plan remains appropriate    Precautions / Restrictions Precautions Precautions: None Restrictions Weight Bearing Restrictions: No   Pertinent Vitals/Pain Patient reports 8/10 pain at this time     Mobility  Bed Mobility Bed Mobility: Not assessed Details for Bed Mobility Assistance: up in recliner Transfers Transfers: Sit to Stand;Stand to Sit Sit to Stand: 6: Modified independent (Device/Increase time) Stand to Sit: 6: Modified independent (Device/Increase time) Details for Transfer Assistance: Increased time to perform but no need for physical assist or cues Ambulation/Gait Ambulation/Gait Assistance: 5: Supervision Ambulation Distance (Feet): 420 Feet (30 ft with no assistive device) Assistive device: Rolling walker Ambulation/Gait Assistance Details: pain and tightness on left LE causing mild antalgic gait Gait  Pattern: Step-through pattern;Antalgic;Wide base of support Gait velocity: decreased General Gait Details: steady despite antalgic gait      PT Goals (current goals can now be found in the care plan section) Acute Rehab PT Goals Patient Stated Goal: To return home with intermittet help from daughters PT Goal Formulation: With patient Time For Goal Achievement: 08/21/13 Potential to Achieve Goals: Good  Visit Information  Last PT Received On: 08/16/13 Assistance Needed: +1 History of Present Illness: Patient is a 57 y/o female admitted s/p left fem-pop bypass due to pain and ischemia right fourth and fifth toes.  PMH positive for colon CA, arthritis, COPD, Depression, HTN, memingitis, and vertigo.    Subjective Data  Subjective: I didn't get this much attention after my surgery Patient Stated Goal: To return home with intermittet help from daughters   Cognition  Cognition Arousal/Alertness: Awake/alert Behavior During Therapy: WFL for tasks assessed/performed Overall Cognitive Status: Within Functional Limits for tasks assessed    Balance  Balance Balance Assessed: Yes Static Standing Balance Static Standing - Balance Support: No upper extremity supported Static Standing - Level of Assistance: 5: Stand by assistance  End of Session PT - End of Session Equipment Utilized During Treatment: Gait belt Activity Tolerance: Patient tolerated treatment well Patient left: in chair;with call bell/phone within reach   [place], [place] 08/16/2013, 10:38 AM Alben Deeds, PT DPT  [phone]

## 2013-08-16 NOTE — Discharge Summary (Signed)
[facility] Discharge Summary   Patient ID:  Whitney Walker MRN: [id number] DOB/AGE: 1956-09-26 57 y.o.  Admit date: 08/13/2013 Discharge date: 08/16/2013 Date of Surgery: 08/13/2013 Surgeon: Surgeon(s): Mal [name], MD  Admission Diagnosis: PVD W/ ISCHEMIC TOES  Discharge Diagnoses:  PVD W/ ISCHEMIC TOES  Secondary Diagnoses: Past Medical History  Diagnosis Date  . Anxiety   . Arthritis   . COPD (chronic obstructive pulmonary disease)   . Depression   . Hypertension   . Meningitis   . Vertigo   . Colon cancer   . Peripheral vascular disease   . CKD (chronic kidney disease)     Stage III 03/2013    Procedure(s): BYPASS GRAFT FEMORAL-POPLITEAL ARTERY WITH NON-REVERSED SAPHANEOUS VEIN; ULTRASOUND GUIDED  Discharged Condition: good  HPI: 57 year old female returns to discuss further treatment regarding her ischemic left fourth and fifth toes. This occurred several weeks ago and she has been having significant pain and no status. There has been blistering. I saw her in the emergency department a few weeks ago and arrange for angiography by Dr. Lurline Hare. He was unable to perform percutaneous revascularization and she now returns to discuss left femoral-popliteal bypass grafting. She has no definite history of coronary artery disease but does have an abnormal EKG. She has occasional chest discomfort as well as dyspnea on exertion, orthopnea, leg pain, and weakness in her arms and legs, and dizziness.   Post-op day 1 she had increased WBC 18K, urine culture was negative.  He main issue was pain control and mobility.  Dry dressings were applied daily to the incisions secondary to SS drainage.  Post-op day 3 she was ambulating in the halls with a rolling walker and stand by assistance.  Her pain is well controlled on PO pain medication and her WBC are trending down.  She will be discharged today under the care of her family with a rolling walker.     Hospital  Course:  Whitney Walker is a 57 y.o. female is S/P Left Procedure(s): BYPASS GRAFT FEMORAL-POPLITEAL ARTERY WITH NON-REVERSED SAPHANEOUS VEIN; ULTRASOUND GUIDED Extubated: POD # 0 Physical exam: palpable DP pulses bilaterally equal Post-op wounds healing well Pt. Ambulating, voiding and taking PO diet without difficulty. Pt pain controlled with PO pain meds. Labs as below Complications:none  Consults:     Significant Diagnostic Studies: CBC Lab Results  Component Value Date   WBC 16.7* 08/16/2013   HGB 10.7* 08/16/2013   HCT 33.7* 08/16/2013   MCV 86.0 08/16/2013   PLT 276 08/16/2013    BMET    Component Value Date/Time   NA 135 08/14/2013 0415   NA 140 12/04/2012 1056   K 5.3* 08/14/2013 0415   K 4.8 12/04/2012 1056   CL 103 08/14/2013 0415   CL 111* 12/04/2012 1056   CO2 21 08/14/2013 0415   CO2 21* 12/04/2012 1056   GLUCOSE 129* 08/14/2013 0415   GLUCOSE 97 12/04/2012 1056   BUN 34* 08/14/2013 0415   BUN 22.0 12/04/2012 1056   CREATININE 2.24* 08/14/2013 0415   CREATININE 1.7* 12/04/2012 1056   CALCIUM 8.6 08/14/2013 0415   CALCIUM 9.3 12/04/2012 1056   GFRNONAA 23* 08/14/2013 0415   GFRAA 27* 08/14/2013 0415   COAG Lab Results  Component Value Date   INR 0.95 08/10/2013   INR 1.03 12/08/2009     Disposition:  Discharge to :Home Discharge Orders   Future Appointments Provider Department Dept Phone   08/31/2013 8:45 AM Mal [name],  MD [facility] [phone]   10/29/2013 1:00 PM [facility] [place] [phone]   11/03/2013 10:00 AM Wyatt Portela, MD [place] [phone]   Future Orders Complete By Expires   Call MD for:  redness, tenderness, or signs of infection (pain, swelling, bleeding, redness, odor or green/yellow discharge around incision site)  As directed    Call MD for:  severe or increased pain, loss or decreased feeling  in affected limb(s)  As directed     Call MD for:  temperature >100.5  As directed    Discharge patient  As directed    Comments:     Discharge pt to home   Discharge wound care:  As directed    Comments:     Wash the groin wound with soap and water daily and pat dry. (No tub bath-only shower)  Then put a dry gauze or washcloth there to keep this area dry daily and as needed.  Do not use Vaseline or neosporin on your incisions.  Only use soap and water on your incisions and then protect and keep dry.   Driving Restrictions  As directed    Comments:     No driving for 2 weeks   Lifting restrictions  As directed    Comments:     No heavy lifting for 6 weeks   Nursing communication  As directed    Scheduling Instructions:     Please give paper Rx to patient at discharge.  It is located on the paper chart.   Resume previous diet  As directed        Medication List         allopurinol 100 MG tablet  Commonly known as:  ZYLOPRIM  Take 100 mg by mouth daily.     amLODipine 10 MG tablet  Commonly known as:  NORVASC  Take 10 mg by mouth daily.     carvedilol 6.25 MG tablet  Commonly known as:  COREG  Take 12.5 mg by mouth 2 (two) times daily with a meal.     meclizine 25 MG tablet  Commonly known as:  ANTIVERT  Take 25 mg by mouth 3 (three) times daily as needed for dizziness or nausea.     ondansetron 4 MG tablet  Commonly known as:  ZOFRAN  Take 1 tablet (4 mg total) by mouth every 8 (eight) hours as needed for nausea.     oxyCODONE-acetaminophen 5-325 MG per tablet  Commonly known as:  PERCOCET/ROXICET  Take 1-2 tablets by mouth every 4 (four) hours as needed.     rOPINIRole 0.5 MG tablet  Commonly known as:  REQUIP  Take 0.5 mg by mouth at bedtime as needed (RLS).     sertraline 50 MG tablet  Commonly known as:  ZOLOFT  Take 50 mg by mouth every other day.     Vitamin D-3 5000 UNITS Tabs  Take 5,000 Units by mouth daily.       Verbal and written Discharge instructions given to the patient. Wound  care per Discharge AVS     Follow-up Information   Follow up with Tinnie Gens, MD In 2 weeks. (Office will call you to arrange your appt (sent))    Specialty:  Vascular Surgery   Contact information:   [address] [place] [address] [phone]       Signed: Laurence Slate [facility] 08/16/2013, 9:05 AM  - For VQI  Registry use --- Instructions: Press F2 to tab through selections.  Delete question if not applicable.   Post-op:  Wound infection: No  Graft infection: No  Transfusion: No   New Arrhythmia: No Ipsilateral amputation: [x ] no, [ ]  Minor, [ ]  BKA, [ ]  AKA Discharge patency: [x ] Primary, [ ]  Primary assisted, [ ]  Secondary, [ ]  Occluded Patency judged by: [ ]  Dopper only, [ ]  Palpable graft pulse, [ ]  Palpable distal pulse, [ x] ABI inc. > 0.15, [ ]  Duplex Discharge ABI: R >1.0, L >1.0 D/C Ambulatory Status: Ambulatory  Complications: MI: [ x] No, [ ]  Troponin only, [ ]  EKG or Clinical CHF: No Resp failure: [x ] none, [ ]  Pneumonia, [ ]  Ventilator Chg in renal function: [ ]  none, [x ] Inc. Cr > 0.5 resolved prior to DC, [ ]  Temp. Dialysis, [ ]  Permanent dialysis Stroke: [x ] None, [ ]  Minor, [ ]  Major Return to OR: No    Discharge medications: Statin use:  No ASA use:  No Plavix use:  No Beta blocker use: Yes Coumadin use: NO

## 2013-08-16 NOTE — Care Management Note (Signed)
    Page 1 of 1   08/16/2013     4:46:26 PM   CARE MANAGEMENT NOTE 08/16/2013  Patient:  Whitney Walker, Whitney Walker   Account Number:  [id number]  Date Initiated:  08/16/2013  Documentation initiated by:  Whitney Walker  Subjective/Objective Assessment:   PT S/P FEM POP BYPASS GRAFT ON 08/16/13.  PTA, PT INDEPENDENT, LIVES WITH DAUGHTER.     Action/Plan:   P.T./O.T. RECOMMENDING HH FOLLOW UP AT DC AND DME.  PT AGREEABLE TO RW ONLY.  DENIES NEED FOR HH CARE AND BSC.   Anticipated DC Date:  08/16/2013   Anticipated DC Plan:  Whitney Walker  CM consult      Choice offered to / List presented to:     DME arranged  Whitney Walker      DME agency  [place].        Status of service:  Completed, signed off Medicare Important Message given?   (If response is "NO", the following Medicare IM given date fields will be blank) Date Medicare IM given:   Date Additional Medicare IM given:    Discharge Disposition:  HOME/SELF CARE  Per UR Regulation:  Reviewed for med. necessity/level of care/duration of stay  If discussed at [place] of Stay Meetings, dates discussed:    Comments:  08/16/13 Whitney Honor,RN,BSN [id number] REFERRAL TO [facility] FOR DME NEEDS.

## 2013-08-16 NOTE — Progress Notes (Signed)
Discharge instructions along with med list and scripts provided. Patient refused HH OT and 3-in 1 bedside commode. Received rolling walker for discharge; ambulates well independently.  IV d/c'd with catheter intact. Patient transported via wheelchair to lobby for d/c home. Belongings with patient and family.

## 2013-08-16 NOTE — Progress Notes (Signed)
[facility]  Progress Note Bypass Surgery  Date of Surgery: 08/13/2013  Procedure(s): BYPASS GRAFT FEMORAL-POPLITEAL ARTERY WITH NON-REVERSED SAPHANEOUS VEIN; ULTRASOUND GUIDED Surgeon: Surgeon(s): Mal [name], MD  3 Days Post-Op  History of Present Illness  Whitney Walker is a 57 y.o. female who is  3 Days Post-Op. The patient's pre-op symptoms of pain are Improved . Patients pain is well controlled.    VASC. LAB Studies:        ABI: Right >1.0;  Left >1.0;   Imaging: No results found.  Significant Diagnostic Studies: CBC Lab Results  Component Value Date   WBC 16.7* 08/16/2013   HGB 10.7* 08/16/2013   HCT 33.7* 08/16/2013   MCV 86.0 08/16/2013   PLT 276 08/16/2013    BMET    Component Value Date/Time   NA 135 08/14/2013 0415   NA 140 12/04/2012 1056   K 5.3* 08/14/2013 0415   K 4.8 12/04/2012 1056   CL 103 08/14/2013 0415   CL 111* 12/04/2012 1056   CO2 21 08/14/2013 0415   CO2 21* 12/04/2012 1056   GLUCOSE 129* 08/14/2013 0415   GLUCOSE 97 12/04/2012 1056   BUN 34* 08/14/2013 0415   BUN 22.0 12/04/2012 1056   CREATININE 2.24* 08/14/2013 0415   CREATININE 1.7* 12/04/2012 1056   CALCIUM 8.6 08/14/2013 0415   CALCIUM 9.3 12/04/2012 1056   GFRNONAA 23* 08/14/2013 0415   GFRAA 27* 08/14/2013 0415    COAG Lab Results  Component Value Date   INR 0.95 08/10/2013   INR 1.03 12/08/2009   No results found for this basename: PTT    Physical Examination  BP Readings from Last 3 Encounters:  08/16/13 150/80  08/16/13 150/80  08/10/13 148/70   Temp Readings from Last 3 Encounters:  08/16/13 97.9 F (36.6 C) Oral  08/16/13 97.9 F (36.6 C) Oral  08/10/13 98.6 F (37 C) Oral   SpO2 Readings from Last 3 Encounters:  08/16/13 97%  08/16/13 97%  08/10/13 93%   Pulse Readings from Last 3 Encounters:  08/16/13 82  08/16/13 82  08/10/13 78    Pt is A&O x 3 left lower extremity: Incision/s is/are clean,dry.intact, and  healing without  hematoma, erythema.  With min. drainage Limb is warm; with good color Palpable DP bounding pulses bilateral. Left 4th and 5th toes pink, cap refill brisk.  Small plantar blister 4th toe.   Assessment/Plan: Pt. Doing well Post-op pain is controlled Wounds are healing well PT/OT for ambulation Continue wound care as ordered.  Dry dressing daily until no drainage.  Laurence Slate [facility]  08/16/2013 9:01 AM

## 2013-08-16 NOTE — Evaluation (Addendum)
Occupational Therapy Evaluation Patient Details Name: Whitney Walker MRN: [id number] DOB: 1956-12-27 Today's Date: 08/16/2013 Time: [id number] OT Time Calculation (min): 16 min  OT Assessment / Plan / Recommendation History of present illness Patient is a 57 y/o female admitted s/p left fem-pop bypass due to pain and ischemia right fourth and fifth toes.  PMH positive for colon CA, arthritis, COPD, Depression, HTN, memingitis, and vertigo.   Clinical Impression   Presents with deficits listed below. Pt planning to d/c today. Recommending HHOT, but pt stated that she did not want HH.      OT Assessment  All further OT needs can be met in the next venue of care    Follow Up Recommendations  Home health OT;Supervision/Assistance - 24 hour    Barriers to Discharge      Equipment Recommendations  3 in 1 bedside comode    Recommendations for Other Services    Frequency  Min 2X/week    Precautions / Restrictions Precautions Precautions: None Restrictions Weight Bearing Restrictions: No   Pertinent Vitals/Pain Pain 7.5/10 in toes. Soreness in left leg. Increased activity.    ADL  Eating/Feeding: Independent Where Assessed - Eating/Feeding: Edge of bed Grooming: Supervision/safety (supervision to get supplies) Where Assessed - Grooming: Unsupported sitting Upper Body Bathing: Supervision/safety (supervision to get supplies) Where Assessed - Upper Body Bathing: Unsupported sitting Lower Body Bathing: Minimal assistance Where Assessed - Lower Body Bathing: Supported sit to stand Upper Body Dressing: Supervision/safety Where Assessed - Upper Body Dressing: Unsupported sitting Lower Body Dressing: Minimal assistance Where Assessed - Lower Body Dressing: Supported sit to stand (used rail of bed for support) Toilet Transfer: Building services engineer Method: Sit to Loss adjuster, chartered: Other (comment) (from bed) Tub/Shower Transfer: Simulated;Min  guard Tub/Shower Transfer Method: Therapist, art: Other (comment) (practiced stepping over) Equipment Used: Rolling walker Transfers/Ambulation Related to ADLs: Supervision/[place] for ambulation-pt not using walker in hallway and grabbing onto rails-OT recommended to use walker. Supervision for transfers. ADL Comments: Pt practiced tub/shower transfer and pt close to losing balance. Recommended using some type of chair (lawn chair, fold down chair) in shower and pt states she does not have one. Educated to have daughter with her when bathing. Pt does not want 3 in 1. Pt did not want to practice toilet transfer. Pt ambulated in hallway and was holding onto rails and stuff in the room. OT recommended using walker for ambulation. Pt said she may be going to daughter's house and she has walk in shower. OT educated on correct technique for shower transfer. Pt donned dress, donned/doffed pants and donned slip. OT encouraged pt to try to reach to sock, but pt did not do so. Overall Min A level for LB ADLs. Educated on dressing technique. Pt had flip flops on and OT explained that shoes with a back to them are safer.  Pt has reacher at home she can use for LB dressing. OT also discussed having walker with her for LB dressing for balance as she was holding onto bed (hard part at end of bed-foot board?) for balance.    OT Diagnosis: Acute pain  OT Problem List: Impaired balance (sitting and/or standing);Decreased safety awareness;Decreased knowledge of use of DME or AE;Pain OT Treatment Interventions: Self-care/ADL training;DME and/or AE instruction;Therapeutic activities;Patient/family education;Balance training   OT Goals(Current goals can be found in the care plan section) Acute Rehab OT Goals Patient Stated Goal: Return home OT Goal Formulation: With patient Time For Goal Achievement: 08/23/13 Potential to  Achieve Goals: Good  Visit Information  Last OT Received On:  08/16/13 Assistance Needed: +1 History of Present Illness: Patient is a 57 y/o female admitted s/p left fem-pop bypass due to pain and ischemia right fourth and fifth toes.  PMH positive for colon CA, arthritis, COPD, Depression, HTN, memingitis, and vertigo.       Prior [place] expects to be discharged to:: Private residence Living Arrangements: Alone Available Help at Discharge: Family;Available PRN/intermittently Type of Home: Apartment Home Access: Level entry Home Layout: One level Home Equipment: Adaptive equipment Adaptive Equipment: Reacher Prior Function Level of Independence: Independent Comments: driving but not working Corporate investment banker: No difficulties         Vision/Perception     Solicitor Arousal/Alertness: Awake/alert Behavior During Therapy: WFL for tasks assessed/performed Overall Cognitive Status: Within Functional Limits for tasks assessed    Extremity/Trunk Assessment Upper Extremity Assessment Upper Extremity Assessment: Overall WFL for tasks assessed     Mobility Bed Mobility Bed Mobility: Not assessed (sitting EOB when OT arrived) Transfers Transfers: Sit to Stand;Stand to Sit Sit to Stand: 5: Supervision;From bed Stand to Sit: 5: Supervision;To bed Details for Transfer Assistance: supervision for safety     Exercise     Balance Balance Balance Assessed: Yes Static Standing Balance Static Standing - Balance Support: Right upper extremity supported;During functional activity Static Standing - Level of Assistance: 5: Stand by assistance Static Standing - Comment/# of Minutes: pt performed lower body dressing with sit to stand and used bottom part (hard part-foot board?) of bed for balance   End of Session OT - End of Session Equipment Utilized During Treatment: Rolling walker Activity Tolerance: Patient tolerated treatment well Patient left: in bed;Other (comment) (sitting EOB)   GO     Benito Mccreedy OTR/L [id number] 08/16/2013, 2:00 PM

## 2013-08-17 ENCOUNTER — Ambulatory Visit: Payer: Medicare Other | Admitting: Vascular Surgery

## 2013-08-27 ENCOUNTER — Encounter: Payer: Self-pay | Admitting: Vascular Surgery

## 2013-08-31 ENCOUNTER — Encounter: Payer: Self-pay | Admitting: Vascular Surgery

## 2013-08-31 ENCOUNTER — Ambulatory Visit (INDEPENDENT_AMBULATORY_CARE_PROVIDER_SITE_OTHER): Payer: Self-pay | Admitting: Vascular Surgery

## 2013-08-31 VITALS — BP 140/61 | HR 75 | Temp 98.3°F | Ht 63.0 in | Wt 245.0 lb

## 2013-08-31 DIAGNOSIS — Z48812 Encounter for surgical aftercare following surgery on the circulatory system: Secondary | ICD-10-CM

## 2013-08-31 DIAGNOSIS — I739 Peripheral vascular disease, unspecified: Secondary | ICD-10-CM

## 2013-08-31 NOTE — Addendum Note (Signed)
Addended by: Dorthula Rue L on: 08/31/2013 04:24 PM   Modules accepted: Orders

## 2013-08-31 NOTE — Addendum Note (Signed)
Addended by: Dorthula Rue L on: 08/31/2013 01:53 PM   Modules accepted: Orders

## 2013-08-31 NOTE — Progress Notes (Signed)
Subjective:     Patient ID: Whitney Walker, female   DOB: [birth year], 57 y.o.   MRN: [id number]  HPI this 57 year old female is now 2 weeks post left femoral-popliteal bypass with saphenous vein for ischemia of the left fourth and fifth toes. She states she is not having any pain in the toes. Her biggest complaint is some discomfort in the medial calf area the left leg distal to her vein harvesting incision in the distal thigh. She says this is "sore".   Review of Systems     Objective:   Physical Exam BP 140/61  Pulse 75  Temp(Src) 98.3 F (36.8 C) (Oral)  Ht 5\' 3"  (1.6 m)  Wt 245 lb (111.131 kg)  BMI 43.41 kg/m2  SpO2 94%  Gen. obese female in no apparent stress alert and oriented x3 Left lower extremity with well-healed incisions in the groin and thigh area. There is a 3+ dorsalis pedis pulse palpable the left foot. There is an eschar left fifth toe which is dry and healing with no cellulitis or purulence.     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

## 2013-09-06 DIAGNOSIS — N183 Chronic kidney disease, stage 3 unspecified: Secondary | ICD-10-CM | POA: Diagnosis not present

## 2013-09-06 DIAGNOSIS — I743 Embolism and thrombosis of arteries of the lower extremities: Secondary | ICD-10-CM | POA: Diagnosis not present

## 2013-09-06 DIAGNOSIS — I10 Essential (primary) hypertension: Secondary | ICD-10-CM | POA: Diagnosis not present

## 2013-09-06 DIAGNOSIS — E1165 Type 2 diabetes mellitus with hyperglycemia: Secondary | ICD-10-CM | POA: Diagnosis not present

## 2013-09-06 DIAGNOSIS — E1129 Type 2 diabetes mellitus with other diabetic kidney complication: Secondary | ICD-10-CM | POA: Diagnosis not present

## 2013-09-30 ENCOUNTER — Telehealth: Payer: Self-pay | Admitting: Vascular Surgery

## 2013-09-30 ENCOUNTER — Telehealth: Payer: Self-pay

## 2013-09-30 DIAGNOSIS — IMO0001 Reserved for inherently not codable concepts without codable children: Secondary | ICD-10-CM

## 2013-09-30 MED ORDER — CEPHALEXIN 500 MG PO CAPS: 500.0000 mg | ORAL_CAPSULE | Freq: Three times a day (TID) | ORAL | Status: DC

## 2013-09-30 NOTE — Telephone Encounter (Signed)
Spoke with pt, gave appt information, verbalized understanding - kf

## 2013-09-30 NOTE — Telephone Encounter (Signed)
Pt. called with c/o pain in left leg.  Stated "I called 2 weeks ago and never received a call back."  Asked pt. why she didn't try to call back to office, and she stated "I've been waiting on someone to call me."  Stated that when she called 2 weeks ago, she had swelling and a knot on her (L) ankle; reports the knot has gone down now.  Stated she has a sensation of tightness "like a band" around her calf with both walking and at rest;  notes that the sensation is more noticeable at rest.   C/o soreness in foot toward toes, "like a burning sensation."  Questioned about swelling.  States she has swelling in her left leg that improves at night, and increases during day when up and about, or with leg in dependent position.  Reports left thigh incision is red and tender; denies any drainage.  Denies fever; states has had chills.  Pt. also c/o intermittent chest pain "like somebody sticking me with a needle under my left breast."  Stated has has some SOB when questioned; "but my CPAP machine is broken".  Discussed w/ Dr. Oneida Alar.  Advised to start Keflex 500 mg TID x 7 days.  Advised to bring pt. in for follow-up with Dr. Kellie Simmering within next 2 weeks.  Ret'd call to pt.  Advised pt. Of recommendation per Dr. Oneida Alar.  Verb. Understanding. Will be notified of appt. Time/date to see Dr. Kellie Simmering.

## 2013-10-11 ENCOUNTER — Encounter: Payer: Self-pay | Admitting: Vascular Surgery

## 2013-10-12 ENCOUNTER — Ambulatory Visit (INDEPENDENT_AMBULATORY_CARE_PROVIDER_SITE_OTHER): Payer: Medicare Other | Admitting: Vascular Surgery

## 2013-10-12 ENCOUNTER — Encounter: Payer: Self-pay | Admitting: Vascular Surgery

## 2013-10-12 VITALS — BP 109/62 | HR 66 | Ht 63.0 in | Wt 245.0 lb

## 2013-10-12 DIAGNOSIS — I739 Peripheral vascular disease, unspecified: Secondary | ICD-10-CM

## 2013-10-12 NOTE — Progress Notes (Signed)
Subjective:     Patient ID: Whitney Walker, female   DOB: [birth year], 57 y.o.   MRN: [id number]  HPI this 57 year old female returns 2 months status post left femoral-popliteal saphenous vein graft for ischemia of the left fourth and fifth toes. She was concerned about some redness of her distal thigh incision and was treated with antibiotics over the last few weeks and this is now resolved. 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 She states that the toes have remained healed.  Review of Systems     Objective:   Physical Exam BP 109/62  Pulse 66  Ht 5\' 3"  (1.6 m)  Wt 245 lb (111.131 kg)  BMI 43.41 kg/m2  SpO2 98%  General well-developed well-nourished obese female in no apparent stress alert and oriented x3 Left leg with well-healed incisions no evidence of infection. 3+ popliteal and 2+ dorsalis pedis pulse the leg palpable. Fourth and fifth toes are healed.     Assessment:     Status post left femoral-popliteal saphenous vein graft with healed ischemia of left fourth and fifth toes-2 months postop    Plan:     Return as scheduled for duplex scan of the bypass in a few months with ABIs patient reassured regarding healing of the incisions

## 2013-10-28 ENCOUNTER — Other Ambulatory Visit: Payer: Self-pay | Admitting: Oncology

## 2013-10-28 DIAGNOSIS — C189 Malignant neoplasm of colon, unspecified: Secondary | ICD-10-CM

## 2013-10-29 ENCOUNTER — Other Ambulatory Visit (HOSPITAL_BASED_OUTPATIENT_CLINIC_OR_DEPARTMENT_OTHER): Payer: Medicare Other | Admitting: Lab

## 2013-10-29 DIAGNOSIS — D72829 Elevated white blood cell count, unspecified: Secondary | ICD-10-CM | POA: Diagnosis not present

## 2013-10-29 DIAGNOSIS — C189 Malignant neoplasm of colon, unspecified: Secondary | ICD-10-CM

## 2013-10-29 LAB — CBC WITH DIFFERENTIAL/PLATELET
BASO%: 0.5 % (ref 0.0–2.0)
Basophils Absolute: 0.1 10*3/uL (ref 0.0–0.1)
EOS%: 1.2 % (ref 0.0–7.0)
Eosinophils Absolute: 0.2 10*3/uL (ref 0.0–0.5)
HCT: 36.4 % (ref 34.8–46.6)
HGB: 11.3 g/dL — ABNORMAL LOW (ref 11.6–15.9)
LYMPH%: 16.6 % (ref 14.0–49.7)
MCH: 26.5 pg (ref 25.1–34.0)
MCHC: 31.2 g/dL — ABNORMAL LOW (ref 31.5–36.0)
MCV: 84.9 fL (ref 79.5–101.0)
MONO#: 0.8 10*3/uL (ref 0.1–0.9)
MONO%: 5.1 % (ref 0.0–14.0)
NEUT#: 11.7 10*3/uL — ABNORMAL HIGH (ref 1.5–6.5)
NEUT%: 76.6 % (ref 38.4–76.8)
Platelets: 251 10*3/uL (ref 145–400)
RBC: 4.28 10*6/uL (ref 3.70–5.45)
RDW: 14.3 % (ref 11.2–14.5)
WBC: 15.2 10*3/uL — ABNORMAL HIGH (ref 3.9–10.3)
lymph#: 2.5 10*3/uL (ref 0.9–3.3)

## 2013-10-29 LAB — COMPREHENSIVE METABOLIC PANEL (CC13)
ALT: 28 U/L (ref 0–55)
AST: 27 U/L (ref 5–34)
Albumin: 2.6 g/dL — ABNORMAL LOW (ref 3.5–5.0)
Alkaline Phosphatase: 114 U/L (ref 40–150)
Anion Gap: 10 mEq/L (ref 3–11)
BUN: 21.6 mg/dL (ref 7.0–26.0)
CO2: 19 mEq/L — ABNORMAL LOW (ref 22–29)
Calcium: 10.3 mg/dL (ref 8.4–10.4)
Chloride: 110 mEq/L — ABNORMAL HIGH (ref 98–109)
Creatinine: 1.6 mg/dL — ABNORMAL HIGH (ref 0.6–1.1)
Glucose: 146 mg/dl — ABNORMAL HIGH (ref 70–140)
Potassium: 4.2 mEq/L (ref 3.5–5.1)
Sodium: 139 mEq/L (ref 136–145)
Total Bilirubin: 0.21 mg/dL (ref 0.20–1.20)
Total Protein: 8 g/dL (ref 6.4–8.3)

## 2013-10-30 LAB — CEA: CEA: 6.5 ng/mL — ABNORMAL HIGH (ref 0.0–5.0)

## 2013-11-02 ENCOUNTER — Telehealth: Payer: Self-pay | Admitting: Oncology

## 2013-11-02 NOTE — Telephone Encounter (Signed)
Pt called and xcancelled appt with Md on 11/5 , does not want to r/s @ the moment, nurse notified

## 2013-11-03 ENCOUNTER — Ambulatory Visit: Payer: Medicare Other | Admitting: Hematology and Oncology

## 2013-11-03 ENCOUNTER — Ambulatory Visit: Payer: Medicare Other | Admitting: Oncology

## 2013-11-04 ENCOUNTER — Other Ambulatory Visit: Payer: Self-pay

## 2013-11-14 ENCOUNTER — Emergency Department (HOSPITAL_COMMUNITY): Payer: Medicare Other

## 2013-11-14 ENCOUNTER — Encounter (HOSPITAL_COMMUNITY): Payer: Self-pay | Admitting: Emergency Medicine

## 2013-11-14 ENCOUNTER — Inpatient Hospital Stay (HOSPITAL_COMMUNITY)
Admission: EM | Admit: 2013-11-14 | Discharge: 2013-11-16 | DRG: 176 | Disposition: A | Discharge status: Home Health | Payer: Medicare Other | Attending: Internal Medicine | Admitting: Internal Medicine

## 2013-11-14 DIAGNOSIS — J4489 Other specified chronic obstructive pulmonary disease: Secondary | ICD-10-CM | POA: Diagnosis present

## 2013-11-14 DIAGNOSIS — I10 Essential (primary) hypertension: Secondary | ICD-10-CM | POA: Diagnosis not present

## 2013-11-14 DIAGNOSIS — I739 Peripheral vascular disease, unspecified: Secondary | ICD-10-CM | POA: Diagnosis present

## 2013-11-14 DIAGNOSIS — F411 Generalized anxiety disorder: Secondary | ICD-10-CM | POA: Diagnosis present

## 2013-11-14 DIAGNOSIS — Z86711 Personal history of pulmonary embolism: Secondary | ICD-10-CM | POA: Diagnosis not present

## 2013-11-14 DIAGNOSIS — I509 Heart failure, unspecified: Secondary | ICD-10-CM | POA: Diagnosis present

## 2013-11-14 DIAGNOSIS — Z87891 Personal history of nicotine dependence: Secondary | ICD-10-CM | POA: Diagnosis not present

## 2013-11-14 DIAGNOSIS — N184 Chronic kidney disease, stage 4 (severe): Secondary | ICD-10-CM | POA: Diagnosis present

## 2013-11-14 DIAGNOSIS — N183 Chronic kidney disease, stage 3 unspecified: Secondary | ICD-10-CM | POA: Diagnosis present

## 2013-11-14 DIAGNOSIS — Z85038 Personal history of other malignant neoplasm of large intestine: Secondary | ICD-10-CM | POA: Diagnosis not present

## 2013-11-14 DIAGNOSIS — F329 Major depressive disorder, single episode, unspecified: Secondary | ICD-10-CM | POA: Diagnosis present

## 2013-11-14 DIAGNOSIS — E669 Obesity, unspecified: Secondary | ICD-10-CM | POA: Diagnosis present

## 2013-11-14 DIAGNOSIS — M129 Arthropathy, unspecified: Secondary | ICD-10-CM | POA: Diagnosis present

## 2013-11-14 DIAGNOSIS — I2699 Other pulmonary embolism without acute cor pulmonale: Principal | ICD-10-CM

## 2013-11-14 DIAGNOSIS — L98499 Non-pressure chronic ulcer of skin of other sites with unspecified severity: Secondary | ICD-10-CM | POA: Diagnosis present

## 2013-11-14 DIAGNOSIS — R040 Epistaxis: Secondary | ICD-10-CM | POA: Diagnosis not present

## 2013-11-14 DIAGNOSIS — F3289 Other specified depressive episodes: Secondary | ICD-10-CM | POA: Diagnosis present

## 2013-11-14 DIAGNOSIS — Z9049 Acquired absence of other specified parts of digestive tract: Secondary | ICD-10-CM | POA: Diagnosis not present

## 2013-11-14 DIAGNOSIS — I519 Heart disease, unspecified: Secondary | ICD-10-CM | POA: Diagnosis not present

## 2013-11-14 DIAGNOSIS — R0602 Shortness of breath: Secondary | ICD-10-CM | POA: Diagnosis not present

## 2013-11-14 DIAGNOSIS — I5032 Chronic diastolic (congestive) heart failure: Secondary | ICD-10-CM | POA: Diagnosis not present

## 2013-11-14 DIAGNOSIS — I129 Hypertensive chronic kidney disease with stage 1 through stage 4 chronic kidney disease, or unspecified chronic kidney disease: Secondary | ICD-10-CM | POA: Diagnosis present

## 2013-11-14 DIAGNOSIS — I7025 Atherosclerosis of native arteries of other extremities with ulceration: Secondary | ICD-10-CM | POA: Diagnosis present

## 2013-11-14 DIAGNOSIS — J449 Chronic obstructive pulmonary disease, unspecified: Secondary | ICD-10-CM | POA: Diagnosis present

## 2013-11-14 DIAGNOSIS — T45515A Adverse effect of anticoagulants, initial encounter: Secondary | ICD-10-CM | POA: Diagnosis not present

## 2013-11-14 HISTORY — DX: Shortness of breath: R06.02

## 2013-11-14 LAB — BASIC METABOLIC PANEL
BUN: 26 mg/dL — ABNORMAL HIGH (ref 6–23)
CO2: 23 mEq/L (ref 19–32)
Calcium: 9.7 mg/dL (ref 8.4–10.5)
Chloride: 106 mEq/L (ref 96–112)
Creatinine, Ser: 1.7 mg/dL — ABNORMAL HIGH (ref 0.50–1.10)
GFR calc Af Amer: 37 mL/min — ABNORMAL LOW (ref >90)
GFR calc non Af Amer: 32 mL/min — ABNORMAL LOW (ref >90)
Glucose, Bld: 121 mg/dL — ABNORMAL HIGH (ref 70–99)
Potassium: 4.2 mEq/L (ref 3.5–5.1)
Sodium: 139 mEq/L (ref 135–145)

## 2013-11-14 LAB — POCT I-STAT TROPONIN I: Troponin i, poc: 0 ng/mL (ref 0.00–0.08)

## 2013-11-14 LAB — CBC
HCT: 36.7 % (ref 36.0–46.0)
Hemoglobin: 11.9 g/dL — ABNORMAL LOW (ref 12.0–15.0)
MCH: 27.7 pg (ref 26.0–34.0)
MCHC: 32.4 g/dL (ref 30.0–36.0)
MCV: 85.5 fL (ref 78.0–100.0)
Platelets: 206 10*3/uL (ref 150–400)
RBC: 4.29 MIL/uL (ref 3.87–5.11)
RDW: 14.4 % (ref 11.5–15.5)
WBC: 16.4 10*3/uL — ABNORMAL HIGH (ref 4.0–10.5)

## 2013-11-14 LAB — COMPREHENSIVE METABOLIC PANEL
ALT: 24 U/L (ref 0–35)
AST: 22 U/L (ref 0–37)
Albumin: 2.7 g/dL — ABNORMAL LOW (ref 3.5–5.2)
Alkaline Phosphatase: 102 U/L (ref 39–117)
BUN: 26 mg/dL — ABNORMAL HIGH (ref 6–23)
CO2: 24 mEq/L (ref 19–32)
Calcium: 9.6 mg/dL (ref 8.4–10.5)
Chloride: 104 mEq/L (ref 96–112)
Creatinine, Ser: 1.65 mg/dL — ABNORMAL HIGH (ref 0.50–1.10)
GFR calc Af Amer: 39 mL/min — ABNORMAL LOW (ref >90)
GFR calc non Af Amer: 33 mL/min — ABNORMAL LOW (ref >90)
Glucose, Bld: 115 mg/dL — ABNORMAL HIGH (ref 70–99)
Potassium: 4.2 mEq/L (ref 3.5–5.1)
Sodium: 137 mEq/L (ref 135–145)
Total Bilirubin: 0.2 mg/dL — ABNORMAL LOW (ref 0.3–1.2)
Total Protein: 7.7 g/dL (ref 6.0–8.3)

## 2013-11-14 LAB — PRO B NATRIURETIC PEPTIDE: Pro B Natriuretic peptide (BNP): 523 pg/mL — ABNORMAL HIGH (ref 0–125)

## 2013-11-14 LAB — D-DIMER, QUANTITATIVE: D-Dimer, Quant: >20 ug/mL-FEU — ABNORMAL HIGH (ref 0.00–0.48)

## 2013-11-14 IMAGING — CR DG CHEST 2V
2 series · 2 of 2 positions shown · non-contrast
Comparison: [DATE]

CLINICAL DATA: Progressive shortness of breath.

EXAM:
CHEST  2 VIEW

[w chest lat]
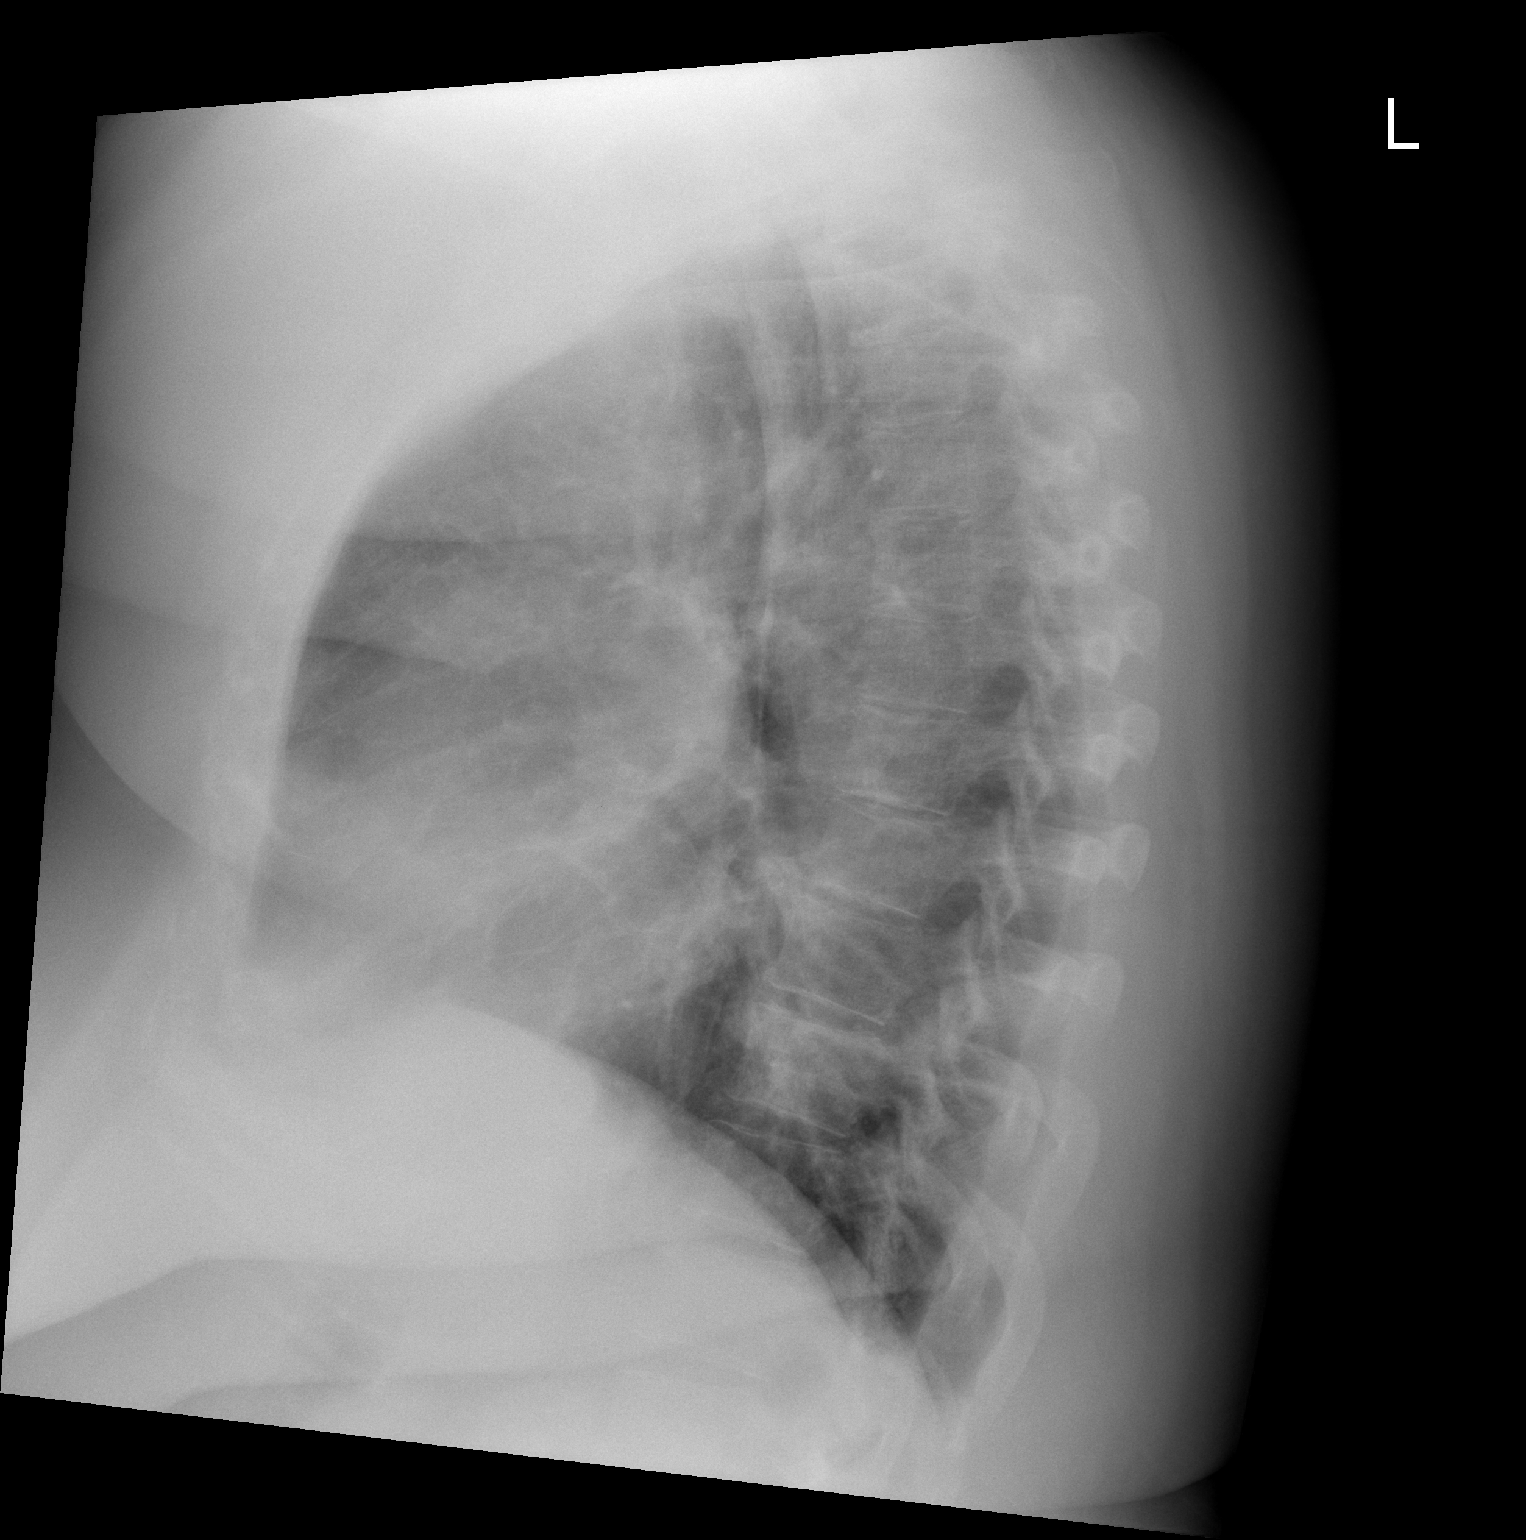

[w chest pa *]
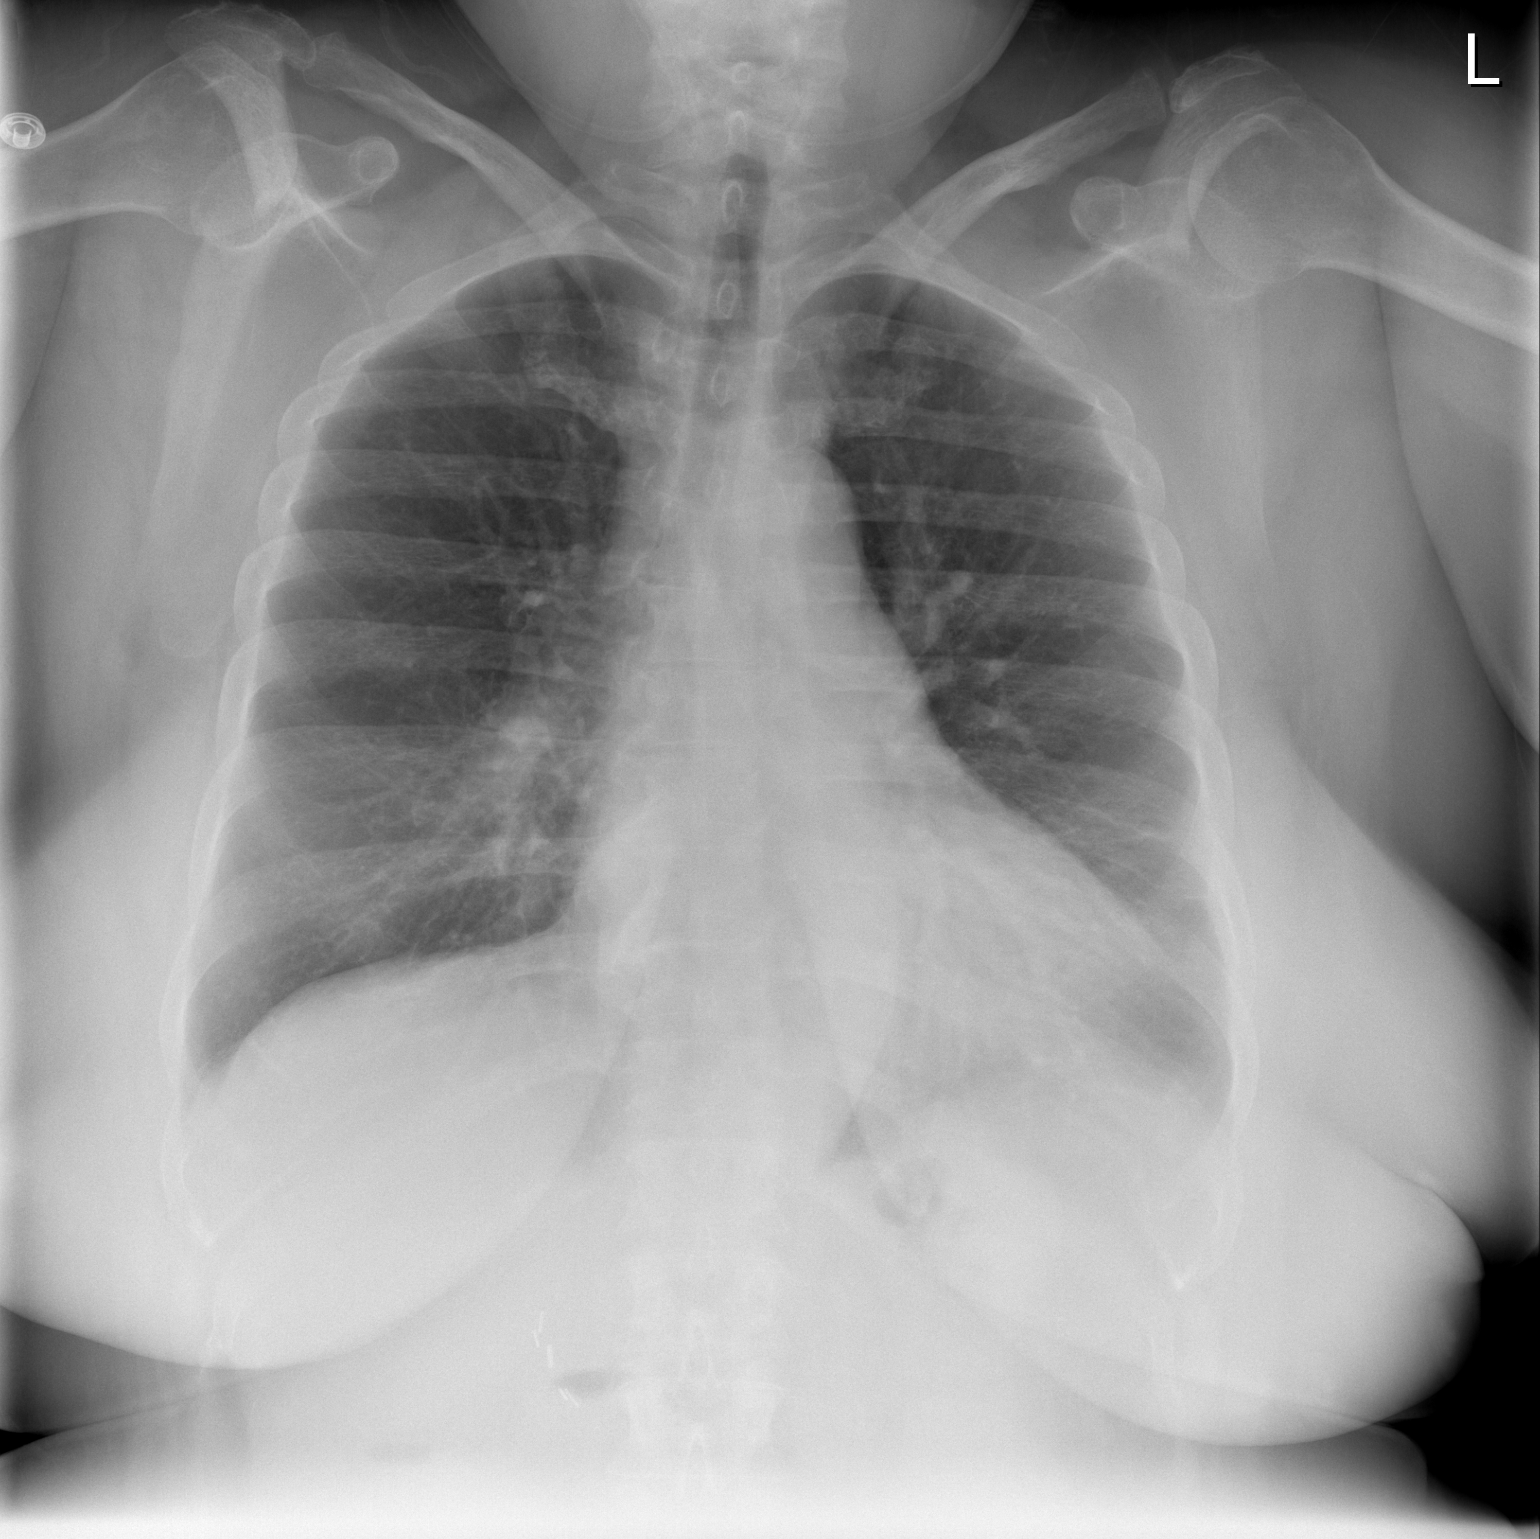

[2 of 2 positions shown; findings below may reference images not displayed]

FINDINGS: Heart size and pulmonary vascularity are normal and the lungs are
clear except for a tiny area of linear scarring at the left lung
base laterally. No effusions. No acute osseous abnormality.
IMPRESSION: No active cardiopulmonary disease.

## 2013-11-14 MED ORDER — CARVEDILOL 12.5 MG PO TABS
12.5000 mg | ORAL_TABLET | Freq: Every day | ORAL | Status: DC
  Administered 2013-11-15 – 2013-11-16 (×2): 12.5 mg via ORAL
  Filled 2013-11-14 (×2): qty 1

## 2013-11-14 MED ORDER — VITAMIN D-3 125 MCG (5000 UT) PO TABS: 5000.0000 [IU] | ORAL_TABLET | Freq: Every day | ORAL | Status: DC

## 2013-11-14 MED ORDER — VITAMIN D3 25 MCG (1000 UNIT) PO TABS
5000.0000 [IU] | ORAL_TABLET | Freq: Every day | ORAL | Status: DC
  Administered 2013-11-15 – 2013-11-16 (×2): 5000 [IU] via ORAL
  Filled 2013-11-14 (×2): qty 5

## 2013-11-14 MED ORDER — AMLODIPINE BESYLATE 10 MG PO TABS
10.0000 mg | ORAL_TABLET | Freq: Every day | ORAL | Status: DC
  Administered 2013-11-15 – 2013-11-16 (×2): 10 mg via ORAL
  Filled 2013-11-14 (×2): qty 1

## 2013-11-14 MED ORDER — SODIUM CHLORIDE 0.9 % IJ SOLN
3.0000 mL | Freq: Two times a day (BID) | INTRAMUSCULAR | Status: DC
  Administered 2013-11-15 – 2013-11-16 (×4): 3 mL via INTRAVENOUS

## 2013-11-14 MED ORDER — LORAZEPAM 1 MG PO TABS
1.0000 mg | ORAL_TABLET | Freq: Four times a day (QID) | ORAL | Status: DC | PRN
  Administered 2013-11-14 – 2013-11-15 (×2): 1 mg via ORAL
  Filled 2013-11-14 (×2): qty 1

## 2013-11-14 MED ORDER — ENOXAPARIN SODIUM 120 MG/0.8ML ~~LOC~~ SOLN
115.0000 mg | Freq: Once | SUBCUTANEOUS | Status: AC
  Administered 2013-11-14: 115 mg via SUBCUTANEOUS
  Filled 2013-11-14: qty 0.8

## 2013-11-14 MED ORDER — TECHNETIUM TO 99M ALBUMIN AGGREGATED
6.0000 | Freq: Once | INTRAVENOUS | Status: AC | PRN
  Administered 2013-11-14: 6 via INTRAVENOUS

## 2013-11-14 MED ORDER — ENOXAPARIN SODIUM 120 MG/0.8ML ~~LOC~~ SOLN
115.0000 mg | Freq: Two times a day (BID) | SUBCUTANEOUS | Status: DC
  Administered 2013-11-15: 115 mg via SUBCUTANEOUS
  Filled 2013-11-14 (×3): qty 0.8

## 2013-11-14 MED ORDER — TECHNETIUM TC 99M DIETHYLENETRIAME-PENTAACETIC ACID: 40.0000 | Freq: Once | INTRAVENOUS | Status: AC | PRN

## 2013-11-14 MED ORDER — ENOXAPARIN SODIUM 100 MG/ML ~~LOC~~ SOLN
1.0000 mg/kg | Freq: Once | SUBCUTANEOUS | Status: DC
  Filled 2013-11-14: qty 2

## 2013-11-14 MED ORDER — OXYCODONE-ACETAMINOPHEN 5-325 MG PO TABS
1.0000 | ORAL_TABLET | ORAL | Status: DC | PRN
  Filled 2013-11-14: qty 1

## 2013-11-14 MED ORDER — IBUPROFEN 800 MG PO TABS
800.0000 mg | ORAL_TABLET | Freq: Every day | ORAL | Status: DC | PRN
  Administered 2013-11-15 (×2): 800 mg via ORAL
  Filled 2013-11-14 (×3): qty 1

## 2013-11-14 NOTE — ED Provider Notes (Signed)
CSN: [id number]     Arrival date & time 11/14/13  1454 History   First MD Initiated Contact with Patient 11/14/13 1522     Chief Complaint  Patient presents with  . Shortness of Breath   (Consider location/radiation/quality/duration/timing/severity/associated sxs/prior Treatment) HPI This is a 57 year old lady status post a femoropopliteal 2 months ago, history of COPD, former smoker, who comes in today with increasing dyspnea over several weeks. She states it became much more severe this morning on awakening. She denies cough or wheezing. She has had some chills and nausea but no fever. She denies chest pain, abdominal pain, or peripheral edema. She states she has had some pain and swelling in the left lower extremity that has been present since the time of surgery. She denies any new or worsening swelling in her left lower extremity and denies any history of DVT or pulmonary embolism. Past Medical History  Diagnosis Date  . Anxiety   . Arthritis   . COPD (chronic obstructive pulmonary disease)   . Depression   . Hypertension   . Meningitis   . Vertigo   . Colon cancer   . Peripheral vascular disease   . CKD (chronic kidney disease)     Stage III 03/2013   Past Surgical History  Procedure Laterality Date  . Cesarean section      X 3 [id number]  . Cholecystectomy    . Abdominal hysterectomy    . Diverticulosis surgery-2002  2012  . Colectomy  2010  . Cholecystectomy  1980/s  . Femoral-popliteal bypass graft Left 08/13/2013    Procedure: BYPASS GRAFT FEMORAL-POPLITEAL ARTERY WITH NON-REVERSED SAPHANEOUS VEIN; ULTRASOUND GUIDED;  Surgeon: Mal [name], MD;  Location: [facility] OR;  Service: Vascular;  Laterality: Left;   Family History  Problem Relation Age of Onset  . Cancer Mother     breast and bone  . Cancer Father     prostate   History  Substance Use Topics  . Smoking status: Former Smoker -- 40 years    Types: Cigarettes    Quit date: 09/12/2013  . Smokeless tobacco:  Never Used     Comment: pt states that she is using the e cigs  . Alcohol Use: Yes     Comment: rarely   OB History   Grav Para Term Preterm Abortions TAB SAB Ect Mult Living                 Review of Systems  All other systems reviewed and are negative.    Allergies  Iohexol  Home Medications   Current Outpatient Rx  Name  Route  Sig  Dispense  Refill  . amLODipine (NORVASC) 10 MG tablet   Oral   Take 10 mg by mouth daily.         . carvedilol (COREG) 6.25 MG tablet   Oral   Take 12.5 mg by mouth daily.          . Cholecalciferol (VITAMIN D-3) 5000 UNITS TABS   Oral   Take 5,000 Units by mouth daily.         [name] ibuprofen (ADVIL,MOTRIN) 200 MG tablet   Oral   Take 800 mg by mouth daily as needed for mild pain.         [name] oxyCODONE-acetaminophen (PERCOCET/ROXICET) 5-325 MG per tablet   Oral   Take 1-2 tablets by mouth every 4 (four) hours as needed for severe pain.          BP 156/85  Pulse 102  Temp(Src) 98 F (36.7 C) (Oral)  Resp 25  Ht 5\' 3"  (1.6 m)  Wt 249 lb (112.946 kg)  BMI 44.12 kg/m2  SpO2 88% Physical Exam  Nursing note and vitals reviewed. Constitutional: She is oriented to person, place, and time. She appears well-developed and well-nourished.  HENT:  Head: Normocephalic and atraumatic.  Right Ear: External ear normal.  Left Ear: External ear normal.  Nose: Nose normal.  Mouth/Throat: Oropharynx is clear and moist.  Eyes: Conjunctivae and EOM are normal. Pupils are equal, round, and reactive to light.  Neck: Normal range of motion. Neck supple.  Cardiovascular: Tachycardia present.   Pulmonary/Chest: Effort normal and breath sounds normal.  Abdominal: Soft. Bowel sounds are normal.  Musculoskeletal:  Left lower extremity with well healing incisions. Dorsal pedalis pulses are 2+ bilaterally. She does appear to be somewhat swollen in the left calf with some tenderness to palpation comparable to the right calf.  Neurological: She is  alert and oriented to person, place, and time. She has normal reflexes.  Skin: Skin is warm and dry.  Psychiatric: She has a normal mood and affect. Her behavior is normal. Judgment and thought content normal.    ED Course  Procedures (including critical care time) Labs Review Labs Reviewed  CBC - Abnormal; Notable for the following:    WBC 16.4 (*)    Hemoglobin 11.9 (*)    All other components within normal limits  BASIC METABOLIC PANEL  POCT I-STAT TROPONIN I   Imaging Review Dg Chest 2 View (if Patient Has Fever And/or Copd)  11/14/2013   CLINICAL DATA:  Progressive shortness of breath.  EXAM: CHEST  2 VIEW  COMPARISON:  08/10/2013  FINDINGS: Heart size and pulmonary vascularity are normal and the lungs are clear except for a tiny area of linear scarring at the left lung base laterally. No effusions. No acute osseous abnormality.  IMPRESSION: No active cardiopulmonary disease.   Electronically Signed   By: Rozetta Nunnery M.D.   On: 11/14/2013 16:08    EKG Interpretation   None       MDM  57 year old lady with recent surgery he presents today with dyspnea with clear chest x-Ailen Strauch and tachycardia noted. Her d-dimer is elevated at greater than 20 and she has chronic renal insufficiency with a creatinine today of 1.6. She is empirically started on Lovenox due to too high clinical suspicion of pulmonary embolism and a VQ scan has been ordered. She is hemodynamically stable here in the emergency department but will require admission with new respiratory symptoms.   RN states tech will require radiologist approval to due VQ scan.  Called and discussed with Dr. Katherine Basset who will call in team.   Patient with high probability vq scan.  Discussed with Dr. Katherine Basset and Dr. Alcario Drought.  Dr. Alcario Drought is present in department and will assume care.    Shaune Pollack, MD 11/14/13 [phone]

## 2013-11-14 NOTE — Progress Notes (Signed)
ANTICOAGULATION CONSULT NOTE - Initial Consult  Pharmacy Consult for lovenox  Indication: pulmonary embolus  Allergies  Allergen Reactions  . Iohexol      Code: HIVES, Desc: HIVES W/ "DYE" USED FOR 1ST CT SCAN BUT NOT 2ND, NO PREMEDS USED, PT UNCERTAIN OF CIRCUMSTANCES,,?POSSIBLE MRI CONTRAST ALLERGY, ALL STUDIES DONE "SOMEWHERE" IN [place]//A.C., Onset Date: [id number]     Patient Measurements: Height: 5\' 3"  (160 cm) Weight: 249 lb (112.946 kg) IBW/kg (Calculated) : 52.4  Vital Signs: Temp: 98.2 F (36.8 C) (11/16 1630) Temp src: Oral (11/16 1630) BP: 184/94 mmHg (11/16 2134) Pulse Rate: 81 (11/16 2134)  Labs:  Recent Labs  11/14/13 1553 11/14/13 1642  HGB 11.9*  --   HCT 36.7  --   PLT 206  --   CREATININE 1.70* 1.65*    Estimated Creatinine Clearance: 44.9 ml/min (by C-G formula based on Cr of 1.65).   Medical History: Past Medical History  Diagnosis Date  . Anxiety   . Arthritis   . COPD (chronic obstructive pulmonary disease)   . Depression   . Hypertension   . Meningitis   . Vertigo   . Colon cancer   . Peripheral vascular disease   . CKD (chronic kidney disease)     Stage III 03/2013    Medications:  See med history  Assessment: 46 yof presented to the ED with increasing SOB. VQ with high probability of PE. D-dimer>20. H/H 11.9/36.7, plts 206. Scr elevated at 1.65. Already received 1x dose of lovenox in the ED. She is not on any anticoagulants PTA.   Goal of Therapy:  Anti-Xa level 0.6-1.2 units/ml 4hrs after LMWH dose given Monitor platelets by anticoagulation protocol: Yes   Plan:  1. Lovenox 115mg  SQ Q12H 2. CBC Q72H while on lovenox 3. F/u oral anticoagulation  Alyssamarie Mounsey, Rande Lawman 11/14/2013,9:40 PM

## 2013-11-14 NOTE — ED Notes (Signed)
IV notified to insert IV line 20G at forearm or higher

## 2013-11-14 NOTE — ED Notes (Signed)
[facility] notified that IV line established

## 2013-11-14 NOTE — H&P (Signed)
[facility] History and Physical  Whitney Walker [id number] DOB: 1956-08-26 DOA: 11/14/2013  Referring physician: ED PCP: Maximino Greenland, MD  Chief Complaint: SOB  HPI: Whitney Walker is a 57 y.o. female who recently had a bypass graft of her LLE in August of this year.  Since that time she has been plagued with swelling of the LLE.  She has also had increasing dyspnea over the past couple of weeks which became significantly more severe on wakening this morning.  Work up in the ED revealed a d-dimer > 20, and a high probability of PE on VQ scan.  Review of Systems: 12 systems reviewed and otherwise negative.  Past Medical History  Diagnosis Date  . Anxiety   . Arthritis   . COPD (chronic obstructive pulmonary disease)   . Depression   . Hypertension   . Meningitis   . Vertigo   . Colon cancer   . Peripheral vascular disease   . CKD (chronic kidney disease)     Stage III 03/2013   Past Surgical History  Procedure Laterality Date  . Cesarean section      X 3 [id number]  . Cholecystectomy    . Abdominal hysterectomy    . Diverticulosis surgery-2002  2012  . Colectomy  2010  . Cholecystectomy  1980/s  . Femoral-popliteal bypass graft Left 08/13/2013    Procedure: BYPASS GRAFT FEMORAL-POPLITEAL ARTERY WITH NON-REVERSED SAPHANEOUS VEIN; ULTRASOUND GUIDED;  Surgeon: Mal [name], MD;  Location: [facility];  Service: Vascular;  Laterality: Left;   Social History:  reports that she quit smoking about 2 months ago. Her smoking use included Cigarettes. She smoked 0.00 packs per day for 40 years. She has never used smokeless tobacco. She reports that she drinks alcohol. She reports that she does not use illicit drugs.   Allergies  Allergen Reactions  . Iohexol      Code: HIVES, Desc: HIVES W/ "DYE" USED FOR 1ST CT SCAN BUT NOT 2ND, NO PREMEDS USED, PT UNCERTAIN OF CIRCUMSTANCES,,?POSSIBLE MRI CONTRAST ALLERGY, ALL STUDIES DONE "SOMEWHERE" IN [place]//A.C., Onset Date:  [id number]     Family History  Problem Relation Age of Onset  . Cancer Mother     breast and bone  . Cancer Father     prostate    Prior to Admission medications   Medication Sig Start Date End Date Taking? Authorizing Provider  amLODipine (NORVASC) 10 MG tablet Take 10 mg by mouth daily.   Yes Historical Provider, MD  carvedilol (COREG) 6.25 MG tablet Take 12.5 mg by mouth daily.    Yes Historical Provider, MD  Cholecalciferol (VITAMIN D-3) 5000 UNITS TABS Take 5,000 Units by mouth daily.   Yes Historical Provider, MD  ibuprofen (ADVIL,MOTRIN) 200 MG tablet Take 800 mg by mouth daily as needed for mild pain.   Yes Historical Provider, MD  oxyCODONE-acetaminophen (PERCOCET/ROXICET) 5-325 MG per tablet Take 1-2 tablets by mouth every 4 (four) hours as needed for severe pain.   Yes Historical Provider, MD   Physical Exam: Filed Vitals:   11/14/13 2134  BP: 184/94  Pulse: 81  Temp:   Resp: 17    General:  NAD, resting comfortably in bed Eyes: PEERLA EOMI ENT: mucous membranes moist Neck: supple w/o JVD Cardiovascular: RRR w/o MRG Respiratory: CTA B Abdomen: soft, nt, nd, bs+ Skin: no rash nor lesion Musculoskeletal: MAE, full ROM all 4 extremities Psychiatric: tearful tone and affect Neurologic: AAOx3, grossly non-focal  Labs on Admission:  Basic Metabolic Panel:  Recent Labs Lab 11/14/13 1553 11/14/13 1642  NA 139 137  K 4.2 4.2  CL 106 104  CO2 23 24  GLUCOSE 121* 115*  BUN 26* 26*  CREATININE 1.70* 1.65*  CALCIUM 9.7 9.6   Liver Function Tests:  Recent Labs Lab 11/14/13 1642  AST 22  ALT 24  ALKPHOS 102  BILITOT 0.2*  PROT 7.7  ALBUMIN 2.7*   No results found for this basename: LIPASE, AMYLASE,  in the last 168 hours No results found for this basename: AMMONIA,  in the last 168 hours CBC:  Recent Labs Lab 11/14/13 1553  WBC 16.4*  HGB 11.9*  HCT 36.7  MCV 85.5  PLT 206   Cardiac Enzymes: No results found for this basename: CKTOTAL,  CKMB, CKMBINDEX, TROPONINI,  in the last 168 hours  BNP (last 3 results)  Recent Labs  11/14/13 1642  PROBNP 523.0*   CBG: No results found for this basename: GLUCAP,  in the last 168 hours  Radiological Exams on Admission: Dg Chest 2 View (if Patient Has Fever And/or Copd)  11/14/2013   CLINICAL DATA:  Progressive shortness of breath.  EXAM: CHEST  2 VIEW  COMPARISON:  08/10/2013  FINDINGS: Heart size and pulmonary vascularity are normal and the lungs are clear except for a tiny area of linear scarring at the left lung base laterally. No effusions. No acute osseous abnormality.  IMPRESSION: No active cardiopulmonary disease.   Electronically Signed   By: Rozetta Nunnery M.D.   On: 11/14/2013 16:08   Nm Pulmonary Perf And Vent  11/14/2013   CLINICAL DATA:  Shortness of breath. Elevated D-dimer. Renal insufficiency precludes CT scan.  EXAM: NUCLEAR MEDICINE VENTILATION - PERFUSION LUNG SCAN  TECHNIQUE: Ventilation images were obtained in multiple projections using inhaled aerosol technetium 99 M DTPA. Perfusion images were obtained in multiple projections after intravenous injection of Tc-30m MAA.  COMPARISON:  Chest radiograph from 06/15/2013  RADIOPHARMACEUTICALS:  40 mCi Tc-65m DTPA aerosol and 6.0 mCi Tc-65m MAA  FINDINGS: Ventilation: No focal ventilation defect.  Perfusion: Large on matched perfusion defects in the right upper lobe and right lower lobe noted with a moderate-sized on matched perfusion defect at the left lung base.  IMPRESSION: 1. High probability of pulmonary embolus, greater than 80% likelihood of pulmonary embolus.  Critical Value/emergent results were called by telephone at the time of interpretation on 11/14/2013 at 9:22 PM to Dr. [name] , who verbally acknowledged these results.   Electronically Signed   By: Sherryl Barters M.D.   On: 11/14/2013 21:24    EKG: Independently reviewed.  Assessment/Plan Principal Problem:   Pulmonary embolism   1. PE - likely as  a complication of bypass surgery in her LLE, on lovenox, admit to tele, 2d echo and venous duplex of BLE to look for source. 2. Radiographic findings of cirrhosis - chronic 3. History of colon cancer s/p colectomy - ordering CEA    Code Status: Full Code (must indicate code status--if unknown or must be presumed, indicate so) Family Communication: Daughter at bedside (indicate person spoken with, if applicable, with phone number if by telephone) Disposition Plan: Admit to inpatient (indicate anticipated LOS)  Time spent: 70 min  Lenee Franze M. [facility] Pager [phone]  If 7PM-7AM, please contact night-coverage [email or web address] Password [email or web address] 11/14/2013, 9:50 PM

## 2013-11-14 NOTE — ED Notes (Signed)
IV attempt x2 unsuccessful, pt non-corporative.

## 2013-11-14 NOTE — ED Notes (Signed)
Pt c/o shortness of breath x 1 week, increased today. Pt talking in short choppy sentences. Pulse ox 88% on room air. Pt had left leg surgery 4 months ago.

## 2013-11-15 DIAGNOSIS — I2699 Other pulmonary embolism without acute cor pulmonale: Secondary | ICD-10-CM | POA: Diagnosis not present

## 2013-11-15 DIAGNOSIS — N183 Chronic kidney disease, stage 3 unspecified: Secondary | ICD-10-CM

## 2013-11-15 DIAGNOSIS — I10 Essential (primary) hypertension: Secondary | ICD-10-CM

## 2013-11-15 DIAGNOSIS — I519 Heart disease, unspecified: Secondary | ICD-10-CM

## 2013-11-15 DIAGNOSIS — I5032 Chronic diastolic (congestive) heart failure: Secondary | ICD-10-CM | POA: Diagnosis not present

## 2013-11-15 DIAGNOSIS — N184 Chronic kidney disease, stage 4 (severe): Secondary | ICD-10-CM | POA: Diagnosis present

## 2013-11-15 HISTORY — DX: Essential (primary) hypertension: I10

## 2013-11-15 LAB — CBC
HCT: 34.2 % — ABNORMAL LOW (ref 36.0–46.0)
Hemoglobin: 11.1 g/dL — ABNORMAL LOW (ref 12.0–15.0)
MCH: 27.3 pg (ref 26.0–34.0)
MCHC: 32.5 g/dL (ref 30.0–36.0)
MCV: 84 fL (ref 78.0–100.0)
Platelets: 186 10*3/uL (ref 150–400)
RBC: 4.07 MIL/uL (ref 3.87–5.11)
RDW: 14.3 % (ref 11.5–15.5)
WBC: 15.4 10*3/uL — ABNORMAL HIGH (ref 4.0–10.5)

## 2013-11-15 LAB — CEA: CEA: 6.2 ng/mL — ABNORMAL HIGH (ref 0.0–5.0)

## 2013-11-15 MED ORDER — RIVAROXABAN (XARELTO) EDUCATION KIT FOR DVT/PE PATIENTS
PACK | Freq: Once | Status: AC
  Administered 2013-11-15: 11:00:00
  Filled 2013-11-15: qty 1

## 2013-11-15 MED ORDER — LIVING BETTER WITH HEART FAILURE BOOK
Freq: Once | Status: AC
  Administered 2013-11-15: 12:00:00
  Filled 2013-11-15: qty 1

## 2013-11-15 MED ORDER — RIVAROXABAN 15 MG PO TABS
15.0000 mg | ORAL_TABLET | Freq: Two times a day (BID) | ORAL | Status: DC
  Administered 2013-11-15 – 2013-11-16 (×3): 15 mg via ORAL
  Filled 2013-11-15 (×4): qty 1

## 2013-11-15 NOTE — Progress Notes (Signed)
VASCULAR LAB PRELIMINARY  PRELIMINARY  PRELIMINARY  PRELIMINARY  Bilateral lower extremity venous Dopplers completed.    Preliminary report:  There is subacute DVT noted in the distal femoral, popliteal, and posterior tibial veins of the left lower extremity.  All other veins appear thrombus free.  Raquel Racey, RVT 11/15/2013, 8:24 AM

## 2013-11-15 NOTE — Care Management Note (Addendum)
    Page 1 of 2   11/16/2013     2:58:55 PM   CARE MANAGEMENT NOTE 11/16/2013  Patient:  Whitney Walker, Whitney Walker   Account Number:  [id number]  Date Initiated:  11/15/2013  Documentation initiated by:  Fuller Mandril  Subjective/Objective Assessment:   57 yo admitted with SOB//Home alone     Action/Plan:   admit to tele, 2d echo and venous duplex of BLE to look for source.//home with self care, benefits check for Xarelto   Anticipated DC Date:  11/16/2013   Anticipated DC Plan:  [place]  CM consult      [facility] Choice  HOME HEALTH   Choice offered to / List presented to:  C-1 Patient        [place] arranged  HH-1 RN  [place].   Status of service:  Completed, signed off Medicare Important Message given?   (If response is "NO", the following Medicare IM given date fields will be blank) Date Medicare IM given:   Date Additional Medicare IM given:    Discharge Disposition:    Per UR Regulation:    If discussed at Long Length of Stay Meetings, dates discussed:    Comments:  11/06/13 [place], RN, BSN, [employer] [phone] CM spoke with patient concerning discharge planning. Pt offered choice for [facility] for [facility] services upon discharge. Per pt choice [facility] to provide [facility] services.  [facility] rep Lelan Pons)  contacted concerning new referral. Pt to discharge home alone. Pt request [facility] for disease management upon discharge. Pt will be walked with RN to see if she qualifies for 02 DME.  11/15/13 [place], RN, BSN, [employer] [phone] Spoke with pt and daughter at bedside regarding benefits check for Xarelto 15mg  PO BID x 3weeks then 20 mg PO daily. Pt has brochure with 30 day free card.  Pt utilizes [facility] on [address] for prescription needs.  NCM called pharmacy to confirm availability of medication. Information relayed to pt.  Pt verbalizes importance of filling  medication upon discharge.

## 2013-11-15 NOTE — Progress Notes (Signed)
[facility] PROGRESS NOTE  Whitney SETSER [id number] DOB: 10/02/1956 DOA: 11/14/2013 PCP: Maximino Greenland, MD  Assessment/Plan: Principal Problem:   Pulmonary embolism: Likely from decreased immobilization plus status post bypass last year. Dopplers pending. Echocardiogram notes no significant right ventricular dysfunction due to PE. Patient was quite upset when we talked about Coumadin given that she would have to be for a long period of time and she was not feeling comfortable about thought of Lovenox at home to bridge her. Fortunately, she will be able to be on Xarelto which is covered by her insurance with good co-pay. Have started this today as well as education Active Problems:   CKD (chronic kidney disease) stage 3, GFR 30-59 ml/min: Looks to be close to baseline. Suspect that this is likely from underlying hypertension.    Chronic diastolic heart failure: BNP around 500. This is likely in part due to PE as well. Confirmed by echocardiogram. Patient does not have a previous diagnosis of this. Have started education and referral to [facility] and management. Patient already on Coreg. Will not give ACE inhibitor do to renal function  peripheral vascular disease: Status post bypass earlier this year Hypertension: Continue antihypertensives.  Code Status: full code  Family Communication: spoke with daughter at the bedside.  Disposition Plan: home, possibly as early as tomorrow   Consultants:  none  Procedures:  Echocardiogram: Grade 1 diastolic dysfunction  Antibiotics:  none  HPI/Subjective: Patient doing okay. Denies any short of breath or chest pain. She did get tearful when I explained to her that she would likely have to be in the hospital for several days  Objective: Filed Vitals:   11/15/13 0330  BP: 177/83  Pulse: 86  Temp: 98.2 F (36.8 C)  Resp: 20    Intake/Output Summary (Last 24 hours) at 11/15/13 1213 Last data filed at 11/15/13 1124  Gross per  24 hour  Intake    220 ml  Output    200 ml  Net     20 ml   Filed Weights   11/14/13 1500 11/14/13 2304 11/15/13 0330  Weight: 112.946 kg (249 lb) 110.7 kg (244 lb 0.8 oz) 110.9 kg (244 lb 7.8 oz)    Exam:   General:  Alert and oriented x3, mild distress secondary to realizing she will have to be here for several days  Cardiovascular: regular rate and rhythm, [id number], soft 2/6 systolic ejection murmur  Respiratory: clear to auscultation bilaterally  Abdomen: soft, obese, nondistended, positive bowel sounds  Musculoskeletal: no clubbing or cyanosis, trace pitting edema   Data Reviewed: Basic Metabolic Panel:  Recent Labs Lab 11/14/13 1553 11/14/13 1642  NA 139 137  K 4.2 4.2  CL 106 104  CO2 23 24  GLUCOSE 121* 115*  BUN 26* 26*  CREATININE 1.70* 1.65*  CALCIUM 9.7 9.6   Liver Function Tests:  Recent Labs Lab 11/14/13 1642  AST 22  ALT 24  ALKPHOS 102  BILITOT 0.2*  PROT 7.7  ALBUMIN 2.7*   CBC:  Recent Labs Lab 11/14/13 1553 11/15/13 0519  WBC 16.4* 15.4*  HGB 11.9* 11.1*  HCT 36.7 34.2*  MCV 85.5 84.0  PLT 206 186   Cardiac Enzymes: No results found for this basename: CKTOTAL, CKMB, CKMBINDEX, TROPONINI,  in the last 168 hours BNP (last 3 results)  Recent Labs  11/14/13 1642  PROBNP 523.0*   CBG: No results found for this basename: GLUCAP,  in the last 168 hours  No results found for this  or any previous visit (from the past 240 hour(s)).   Studies: Dg Chest 2 View (if Patient Has Fever And/or Copd)  11/14/2013   CLINICAL DATA:  Progressive shortness of breath.  EXAM: CHEST  2 VIEW  COMPARISON:  08/10/2013  FINDINGS: Heart size and pulmonary vascularity are normal and the lungs are clear except for a tiny area of linear scarring at the left lung base laterally. No effusions. No acute osseous abnormality.  IMPRESSION: No active cardiopulmonary disease.   Electronically Signed   By: Rozetta Nunnery M.D.   On: 11/14/2013 16:08   Nm Pulmonary  Perf And Vent  11/14/2013   CLINICAL DATA:  Shortness of breath. Elevated D-dimer. Renal insufficiency precludes CT scan.  EXAM: NUCLEAR MEDICINE VENTILATION - PERFUSION LUNG SCAN  TECHNIQUE: Ventilation images were obtained in multiple projections using inhaled aerosol technetium 99 M DTPA. Perfusion images were obtained in multiple projections after intravenous injection of Tc-61m MAA.  COMPARISON:  Chest radiograph from 06/15/2013  RADIOPHARMACEUTICALS:  40 mCi Tc-32m DTPA aerosol and 6.0 mCi Tc-50m MAA  FINDINGS: Ventilation: No focal ventilation defect.  Perfusion: Large on matched perfusion defects in the right upper lobe and right lower lobe noted with a moderate-sized on matched perfusion defect at the left lung base.  IMPRESSION: 1. High probability of pulmonary embolus, greater than 80% likelihood of pulmonary embolus.  Critical Value/emergent results were called by telephone at the time of interpretation on 11/14/2013 at 9:22 PM to Dr. [name] , who verbally acknowledged these results.   Electronically Signed   By: Sherryl Barters M.D.   On: 11/14/2013 21:24    Scheduled Meds: . amLODipine  10 mg Oral Daily  . carvedilol  12.5 mg Oral Daily  . cholecalciferol  5,000 Units Oral Daily  . Living Better with Heart Failure Book   Does not apply Once  . rivaroxaban   Does not apply Once  . sodium chloride  3 mL Intravenous Q12H   Continuous Infusions:   Principal Problem:   Pulmonary embolism Active Problems:   CKD (chronic kidney disease) stage 3, GFR 30-59 ml/min   Chronic diastolic heart failure peripheral vascular disease Hypertension   Time spent: 35 minutes    [facility] Pager [phone]. If 7PM-7AM, please contact night-coverage at [email or web address], password [facility] 11/15/2013, 12:13 PM  LOS: 1 day

## 2013-11-15 NOTE — Progress Notes (Signed)
Echocardiogram 2D Echocardiogram has been performed.  Whitney Walker 11/15/2013, 8:55 AM

## 2013-11-16 DIAGNOSIS — I10 Essential (primary) hypertension: Secondary | ICD-10-CM

## 2013-11-16 DIAGNOSIS — I5032 Chronic diastolic (congestive) heart failure: Secondary | ICD-10-CM

## 2013-11-16 DIAGNOSIS — I739 Peripheral vascular disease, unspecified: Secondary | ICD-10-CM

## 2013-11-16 DIAGNOSIS — I2699 Other pulmonary embolism without acute cor pulmonale: Secondary | ICD-10-CM

## 2013-11-16 DIAGNOSIS — N183 Chronic kidney disease, stage 3 unspecified: Secondary | ICD-10-CM

## 2013-11-16 DIAGNOSIS — L98499 Non-pressure chronic ulcer of skin of other sites with unspecified severity: Secondary | ICD-10-CM

## 2013-11-16 MED ORDER — RIVAROXABAN 15 MG PO TABS: ORAL_TABLET | ORAL | Status: DC

## 2013-11-16 MED ORDER — RIVAROXABAN 20 MG PO TABS: ORAL_TABLET | ORAL | Status: DC

## 2013-11-16 NOTE — Progress Notes (Signed)
RN called at bedside for complaints of epistaxis.  This is the first occurrence that RN was made aware, but per pt this has been on and off since yesterday. Ice pack offered, instructed to apply on forehead.  NP Baltazar Najjar paged, aware. Will continue to monitor and evaluate pt.

## 2013-11-16 NOTE — Progress Notes (Signed)
SATURATION QUALIFICATIONS: (This note is used to comply with regulatory documentation for home oxygen)  Patient Saturations on Room Air at Rest = 97%  Patient Saturations on Room Air while Ambulating = 88%  Patient Saturations on 2 Liters of oxygen while Ambulating = 94-98%  Please briefly explain why patient needs home oxygen: [name] oxygen saturation dropped when ambulating without oxygen. Increased to 94% when applied and when ambulating with oxygen increased to 98%.

## 2013-11-16 NOTE — Progress Notes (Signed)
Patient refused to sign discharge papers because she felt there should have been a "fluid pill" such as furosemide on discharge instructions and when going over discharge papers, no pill was listed. Offered to page the doctor to get it corrected and patient refused and was adamant to leave. The daughter stormed out room and did not help with patient belongings. Tech was able to gather bags and discharged patient to pick up area. Nurse signing off at this time.

## 2013-11-16 NOTE — Progress Notes (Signed)
In to provide [facility] evaluation.  Attending MD was in conference with the family at bedside.  Will attempt to follow up at a later date.  Of note, [facility] Care Management services does not replace or interfere with any services that are arranged by inpatient case management or social work.  For additional questions or referrals please contact Corliss Blacker BSN RN [facility] Liaison at [phone].

## 2013-11-16 NOTE — Progress Notes (Signed)
Whitney Walker to be D/C'd Home per MD order.  Discussed with the patient and all questions fully answered.    Medication List    STOP taking these medications       ibuprofen 200 MG tablet  Commonly known as:  ADVIL,MOTRIN      TAKE these medications       amLODipine 10 MG tablet  Commonly known as:  NORVASC  Take 10 mg by mouth daily.     carvedilol 6.25 MG tablet  Commonly known as:  COREG  Take 12.5 mg by mouth daily.     oxyCODONE-acetaminophen 5-325 MG per tablet  Commonly known as:  PERCOCET/ROXICET  Take 1-2 tablets by mouth every 4 (four) hours as needed for severe pain.     Rivaroxaban 15 MG Tabs tablet  Commonly known as:  XARELTO  Take 15 mg by mouth twice a day for the next 20 days     Rivaroxaban 20 MG Tabs tablet  Commonly known as:  XARELTO  Starting December 8th,  take 20 mg by mouth daily  Start taking on:  12/06/2013     Vitamin D-3 5000 UNITS Tabs  Take 5,000 Units by mouth daily.        VVS, Skin clean, dry and intact without evidence of skin break down, no evidence of skin tears noted. IV catheter discontinued intact. Site without signs and symptoms of complications. Dressing and pressure applied.  An After Visit Summary was printed and given to the patient. Patient escorted via [place], and D/C home via private auto.  [name] 11/16/2013 5:22 PM

## 2013-11-16 NOTE — Discharge Summary (Addendum)
Physician Discharge Summary  Whitney Walker [id number] DOB: [birth year] DOA: 11/14/2013  PCP: Maximino Greenland, MD  Admit date: 11/14/2013 Discharge date: 11/16/2013  Time spent: 35 minutes  Recommendations for Outpatient Follow-up:  -Patient is being discharged with home health physical therapy and nurse  -Patient will followup with [facility] and care management for new congestive heart failure education  -Patient is being discharged on Xarelto 50 mg by mouth twice a day times next 20 days, then 20 mg by mouth daily for the next 6 months  Discharge Diagnoses:  Principal Problem:   Pulmonary embolism Active Problems:   Atherosclerosis of native arteries of the extremities with ulceration(440.23)   CKD (chronic kidney disease) stage 3, GFR 30-59 ml/min   Chronic diastolic heart failure   Unspecified essential hypertension   Discharge Condition: Improved, being discharged home  Diet recommendation: Carb modified heart healthy  Filed Weights   11/14/13 2304 11/15/13 0330 11/16/13 0641  Weight: 110.7 kg (244 lb 0.8 oz) 110.9 kg (244 lb 7.8 oz) 110.451 kg (243 lb 8 oz)    History of present illness:  Patient is a 57 year old female with past medical history bypass graft of her left lower extremity approximately 3 months ago who is at times had swelling of that leg. Over the past few weeks she started having increased shortness of breath which became more severe on the day of admission, 11/16. She came in to the emergency room was noted to have a d-dimer of greater than 20 and by VQ scan-done because of chronic renal disease-had high probability of pulmonary embolus. Hospitals were called for further evaluation and admission  Hospital Course:  Principal Problem:   Pulmonary embolism: Patient initially was started on Lovenox. She preferred not to be on Coumadin plus Lovenox, however, her insurance didn't cover Xarelto. She does have impaired renal dysfunction, her creatinine clearance is  still greater than 30. She tolerated initial dose started and plan will be to continue her 15 mg by mouth twice a day for the next 20 days and then changed to 20 mg by mouth daily. Patient was ambulated on day of discharge and found to have O2 sat of 97% on room air at rest, however with ambulation her oxygen saturations fell to 88%. This improved with 2 L of oxygen given to a range of 94-98% while ambulating. Home oxygen set up by case manager.  Active Problems:   Atherosclerosis of native arteries of the extremities with ulceration(440.23)    CKD (chronic kidney disease) stage 3, GFR 30-59 ml/min: Stable. In review previous creatinine, she is at her baseline, around 1.6    Chronic diastolic heart failure: Echocardiogram done to assess heart dysfunction secondary PE. Heart otherwise looks stable but did note incidental grade 1 diastolic dysfunction. BNP checked and found to be minimally elevated at 500, likely higher than this because of patient's obesity, however she was otherwise breathing comfortably. Provided education and [facility] nurse case managers will follow up with the patient home for better education. Patient will follow up with her cardiologist Dr.Ganji in the next 1-2 weeks for followup. Hesitant to start Lasix, even at low dose, given her advanced renal disease and the fact that we are starting her on Xarelto.     Unspecified essential hypertension: Patient will continue her home antihypertensives.  Epistaxis: Patient had one episode on the evening of 11/17. This was felt to be secondary to starting Xarelto plus previous Lovenox plus she had taken Motrin for headache. Patient will be advised  to not be on any NSAIDs while she is on Xarelto.    Procedures:  2-D echocardiogram noted grade 1 diastolic dysfunction  Consultations:  None  Discharge Exam: Filed Vitals:   11/16/13 1412  BP: 140/65  Pulse: 76  Temp: 98.1 F (36.7 C)  Resp: 20    General: Alert and oriented x3, no  acute distress Cardiovascular: Regular rate and rhythm, [id number], 2/6 systolic ejection murmur Respiratory: Clear to auscultation bilaterally Abdomen: Soft, nontender, nondistended, positive bowel sounds Extremities: No clubbing or cyanosis or edema  Discharge Instructions   Future Appointments Provider Department Dept Phone   12/14/2013 10:30 AM [facility] [place] [facility] [name] [phone]   12/14/2013 11:00 AM [facility] [place] [facility] [name] [phone]   12/14/2013 11:45 AM Mal [name], MD [facility] [phone]       Medication List    STOP taking these medications       ibuprofen 200 MG tablet  Commonly known as:  ADVIL,MOTRIN      TAKE these medications       amLODipine 10 MG tablet  Commonly known as:  NORVASC  Take 10 mg by mouth daily.     carvedilol 6.25 MG tablet  Commonly known as:  COREG  Take 12.5 mg by mouth daily.     oxyCODONE-acetaminophen 5-325 MG per tablet  Commonly known as:  PERCOCET/ROXICET  Take 1-2 tablets by mouth every 4 (four) hours as needed for severe pain.     Rivaroxaban 15 MG Tabs tablet  Commonly known as:  XARELTO  Take 15 mg by mouth twice a day for the next 20 days     Rivaroxaban 20 MG Tabs tablet  Commonly known as:  XARELTO  Starting December 8th,  take 20 mg by mouth daily  Start taking on:  12/06/2013     Vitamin D-3 5000 UNITS Tabs  Take 5,000 Units by mouth daily.       Allergies  Allergen Reactions  . Iohexol      Code: HIVES, Desc: HIVES W/ "DYE" USED FOR 1ST CT SCAN BUT NOT 2ND, NO PREMEDS USED, PT UNCERTAIN OF CIRCUMSTANCES,,?POSSIBLE MRI CONTRAST ALLERGY, ALL STUDIES DONE "SOMEWHERE" IN [place]//A.C., Onset Date: [id number]        Follow-up Information   Follow up with Maximino Greenland, MD In 1 month.   Specialty:  Internal Medicine   Contact information:   [address] [place] [address] [phone]        Follow up with Laverda Page, MD. Schedule an appointment as soon as possible for a visit in 2 weeks.   Specialty:  Cardiology   Contact information:   [address] [place] [address] [phone]        The results of significant diagnostics from this hospitalization (including imaging, microbiology, ancillary and laboratory) are listed below for reference.    Significant Diagnostic Studies: Dg Chest 2 View (if Patient Has Fever And/or Copd)  11/14/2013     IMPRESSION: No active cardiopulmonary disease.   Electronically Signed   By: Rozetta Nunnery M.D.   On: 11/14/2013 16:08   Nm Pulmonary Perf And Vent  11/14/2013   IMPRESSION: 1. High probability of pulmonary embolus, greater than 80% likelihood of pulmonary embolus.  Critical Value/emergent results were called by telephone at the time of interpretation on 11/14/2013 at 9:22 PM to Dr. [name] , who verbally acknowledged these results.   Electronically  Signed   By: Sherryl Barters M.D.   On: 11/14/2013 21:24    Microbiology: No results found for this or any previous visit (from the past 240 hour(s)).   Labs: Basic Metabolic Panel:  Recent Labs Lab 11/14/13 1553 11/14/13 1642  NA 139 137  K 4.2 4.2  CL 106 104  CO2 23 24  GLUCOSE 121* 115*  BUN 26* 26*  CREATININE 1.70* 1.65*  CALCIUM 9.7 9.6   Liver Function Tests:  Recent Labs Lab 11/14/13 1642  AST 22  ALT 24  ALKPHOS 102  BILITOT 0.2*  PROT 7.7  ALBUMIN 2.7*   CBC:  Recent Labs Lab 11/14/13 1553 11/15/13 0519  WBC 16.4* 15.4*  HGB 11.9* 11.1*  HCT 36.7 34.2*  MCV 85.5 84.0  PLT 206 186   Cardiac Enzymes: No results found for this basename: CKTOTAL, CKMB, CKMBINDEX, TROPONINI,  in the last 168 hours BNP: BNP (last 3 results)  Recent Labs  11/14/13 1642  PROBNP 523.0*   CBG: No results found for this basename: GLUCAP,  in the last 168 hours     Signed:  Annita Brod  [facility] 11/16/2013,  3:31 PM

## 2013-11-17 DIAGNOSIS — N183 Chronic kidney disease, stage 3 unspecified: Secondary | ICD-10-CM | POA: Diagnosis not present

## 2013-11-17 DIAGNOSIS — M1990 Unspecified osteoarthritis, unspecified site: Secondary | ICD-10-CM | POA: Diagnosis not present

## 2013-11-17 DIAGNOSIS — I739 Peripheral vascular disease, unspecified: Secondary | ICD-10-CM | POA: Diagnosis not present

## 2013-11-17 DIAGNOSIS — I2699 Other pulmonary embolism without acute cor pulmonale: Secondary | ICD-10-CM | POA: Diagnosis not present

## 2013-11-17 DIAGNOSIS — I129 Hypertensive chronic kidney disease with stage 1 through stage 4 chronic kidney disease, or unspecified chronic kidney disease: Secondary | ICD-10-CM | POA: Diagnosis not present

## 2013-11-17 DIAGNOSIS — I509 Heart failure, unspecified: Secondary | ICD-10-CM | POA: Diagnosis not present

## 2013-11-17 DIAGNOSIS — Z9981 Dependence on supplemental oxygen: Secondary | ICD-10-CM | POA: Diagnosis not present

## 2013-11-17 DIAGNOSIS — I5032 Chronic diastolic (congestive) heart failure: Secondary | ICD-10-CM | POA: Diagnosis not present

## 2013-11-17 DIAGNOSIS — Z7901 Long term (current) use of anticoagulants: Secondary | ICD-10-CM | POA: Diagnosis not present

## 2013-11-19 DIAGNOSIS — I509 Heart failure, unspecified: Secondary | ICD-10-CM | POA: Diagnosis not present

## 2013-11-19 DIAGNOSIS — R609 Edema, unspecified: Secondary | ICD-10-CM | POA: Diagnosis not present

## 2013-11-19 DIAGNOSIS — I129 Hypertensive chronic kidney disease with stage 1 through stage 4 chronic kidney disease, or unspecified chronic kidney disease: Secondary | ICD-10-CM | POA: Diagnosis not present

## 2013-11-19 DIAGNOSIS — I2699 Other pulmonary embolism without acute cor pulmonale: Secondary | ICD-10-CM | POA: Diagnosis not present

## 2013-11-22 DIAGNOSIS — M1990 Unspecified osteoarthritis, unspecified site: Secondary | ICD-10-CM | POA: Diagnosis not present

## 2013-11-22 DIAGNOSIS — Z006 Encounter for examination for normal comparison and control in clinical research program: Secondary | ICD-10-CM | POA: Diagnosis not present

## 2013-11-22 DIAGNOSIS — I509 Heart failure, unspecified: Secondary | ICD-10-CM | POA: Diagnosis not present

## 2013-11-22 DIAGNOSIS — I5032 Chronic diastolic (congestive) heart failure: Secondary | ICD-10-CM | POA: Diagnosis not present

## 2013-11-22 DIAGNOSIS — I10 Essential (primary) hypertension: Secondary | ICD-10-CM | POA: Diagnosis not present

## 2013-11-22 DIAGNOSIS — N183 Chronic kidney disease, stage 3 unspecified: Secondary | ICD-10-CM | POA: Diagnosis not present

## 2013-11-22 DIAGNOSIS — I2699 Other pulmonary embolism without acute cor pulmonale: Secondary | ICD-10-CM | POA: Diagnosis not present

## 2013-11-22 DIAGNOSIS — I129 Hypertensive chronic kidney disease with stage 1 through stage 4 chronic kidney disease, or unspecified chronic kidney disease: Secondary | ICD-10-CM | POA: Diagnosis not present

## 2013-11-24 ENCOUNTER — Emergency Department (HOSPITAL_COMMUNITY)
Admission: EM | Admit: 2013-11-24 | Discharge: 2013-11-24 | Disposition: A | Discharge status: Home / Self Care | Payer: Medicare Other | Attending: Emergency Medicine | Admitting: Emergency Medicine

## 2013-11-24 ENCOUNTER — Encounter (HOSPITAL_COMMUNITY): Payer: Self-pay | Admitting: Emergency Medicine

## 2013-11-24 DIAGNOSIS — Z85038 Personal history of other malignant neoplasm of large intestine: Secondary | ICD-10-CM | POA: Diagnosis not present

## 2013-11-24 DIAGNOSIS — N183 Chronic kidney disease, stage 3 unspecified: Secondary | ICD-10-CM | POA: Insufficient documentation

## 2013-11-24 DIAGNOSIS — Z8659 Personal history of other mental and behavioral disorders: Secondary | ICD-10-CM | POA: Diagnosis not present

## 2013-11-24 DIAGNOSIS — Z8739 Personal history of other diseases of the musculoskeletal system and connective tissue: Secondary | ICD-10-CM | POA: Diagnosis not present

## 2013-11-24 DIAGNOSIS — F17200 Nicotine dependence, unspecified, uncomplicated: Secondary | ICD-10-CM | POA: Insufficient documentation

## 2013-11-24 DIAGNOSIS — H1130 Conjunctival hemorrhage, unspecified eye: Secondary | ICD-10-CM | POA: Insufficient documentation

## 2013-11-24 DIAGNOSIS — I509 Heart failure, unspecified: Secondary | ICD-10-CM | POA: Insufficient documentation

## 2013-11-24 DIAGNOSIS — Z79899 Other long term (current) drug therapy: Secondary | ICD-10-CM | POA: Insufficient documentation

## 2013-11-24 DIAGNOSIS — Z86711 Personal history of pulmonary embolism: Secondary | ICD-10-CM | POA: Insufficient documentation

## 2013-11-24 DIAGNOSIS — I129 Hypertensive chronic kidney disease with stage 1 through stage 4 chronic kidney disease, or unspecified chronic kidney disease: Secondary | ICD-10-CM | POA: Diagnosis not present

## 2013-11-24 DIAGNOSIS — J4489 Other specified chronic obstructive pulmonary disease: Secondary | ICD-10-CM | POA: Insufficient documentation

## 2013-11-24 DIAGNOSIS — M7989 Other specified soft tissue disorders: Secondary | ICD-10-CM | POA: Insufficient documentation

## 2013-11-24 DIAGNOSIS — J449 Chronic obstructive pulmonary disease, unspecified: Secondary | ICD-10-CM | POA: Diagnosis not present

## 2013-11-24 DIAGNOSIS — I2699 Other pulmonary embolism without acute cor pulmonale: Secondary | ICD-10-CM | POA: Diagnosis not present

## 2013-11-24 DIAGNOSIS — M1990 Unspecified osteoarthritis, unspecified site: Secondary | ICD-10-CM | POA: Diagnosis not present

## 2013-11-24 DIAGNOSIS — I5032 Chronic diastolic (congestive) heart failure: Secondary | ICD-10-CM | POA: Diagnosis not present

## 2013-11-24 DIAGNOSIS — H1132 Conjunctival hemorrhage, left eye: Secondary | ICD-10-CM

## 2013-11-24 HISTORY — DX: Other pulmonary embolism without acute cor pulmonale: I26.99

## 2013-11-24 HISTORY — DX: Heart failure, unspecified: I50.9

## 2013-11-24 NOTE — ED Provider Notes (Signed)
CSN: [id number]     Arrival date & time 11/24/13  1404 History   First MD Initiated Contact with Patient 11/24/13 1423     Chief Complaint  Patient presents with  . Eye Problem   (Consider location/radiation/quality/duration/timing/severity/associated sxs/prior Treatment) HPI Comments: 57 yo female with hx of recent hospitalization for PE and CHF presents with left eye redness noted by the home health nurse today. Referred to the ED for evaluation given that she has recently started xarelto for home treatment of her PE. Reports baseline near vision deficit for which she is supposed to wear glasses. Reports that there has been no recent vision changes. Denies any other bleeding, specifically no black/tarry stools, no blood in the urine, no N/V, no bleeding gums. Denies headache.  The history is provided by the patient.    Past Medical History  Diagnosis Date  . Anxiety   . Arthritis   . COPD (chronic obstructive pulmonary disease)   . Depression   . Hypertension   . Meningitis   . Vertigo   . Colon cancer   . Peripheral vascular disease   . CKD (chronic kidney disease)     Stage III 03/2013  . Shortness of breath   . CHF (congestive heart failure)   . PE (pulmonary embolism)    Past Surgical History  Procedure Laterality Date  . Cesarean section      X 3 [id number]  . Cholecystectomy    . Abdominal hysterectomy    . Diverticulosis surgery-2002  2012  . Colectomy  2010  . Cholecystectomy  1980/s  . Femoral-popliteal bypass graft Left 08/13/2013    Procedure: BYPASS GRAFT FEMORAL-POPLITEAL ARTERY WITH NON-REVERSED SAPHANEOUS VEIN; ULTRASOUND GUIDED;  Surgeon: Mal [name], MD;  Location: [facility] OR;  Service: Vascular;  Laterality: Left;   Family History  Problem Relation Age of Onset  . Cancer Mother     breast and bone  . Cancer Father     prostate   History  Substance Use Topics  . Smoking status: Current Some Day Smoker -- 40 years    Types: Cigarettes    Last Attempt  to Quit: 09/12/2013  . Smokeless tobacco: Never Used     Comment: pt states that she is using the e cigs  . Alcohol Use: Yes     Comment: 2 shots recently   OB History   Grav Para Term Preterm Abortions TAB SAB Ect Mult Living                 Review of Systems  Constitutional: Negative for fever.  HENT: Negative for rhinorrhea and sore throat.   Eyes: Positive for redness. Negative for photophobia, pain, discharge, itching and visual disturbance.  Respiratory: Negative for cough and shortness of breath.   Cardiovascular: Positive for leg swelling. Negative for chest pain.  Gastrointestinal: Negative for abdominal pain and blood in stool.  Genitourinary: Negative for hematuria.  Musculoskeletal: Negative for gait problem.  Skin: Negative for color change and rash.  Neurological: Negative for headaches.  Hematological: Negative for adenopathy.  Psychiatric/Behavioral: Negative for agitation.    Allergies  Iohexol  Home Medications   Current Outpatient Rx  Name  Route  Sig  Dispense  Refill  . amLODipine (NORVASC) 10 MG tablet   Oral   Take 10 mg by mouth daily.         . carvedilol (COREG) 6.25 MG tablet   Oral   Take 12.5 mg by mouth daily.          [name]  Cholecalciferol (VITAMIN D-3) 5000 UNITS TABS   Oral   Take 5,000 Units by mouth daily.         . Rivaroxaban (XARELTO) 15 MG TABS tablet   Oral   Take 15 mg by mouth 2 (two) times daily with a meal. Taking BID x 20 days - started on 11/18. Will then start 20mg  daily on December 8th          BP 133/82  Pulse 63  Temp(Src) 97.4 F (36.3 C) (Oral)  Resp 18  SpO2 96% Physical Exam  Constitutional: She appears well-developed and well-nourished.  HENT:  Head: Normocephalic and atraumatic.  Right Ear: External ear normal.  Left Ear: External ear normal.  Eyes: EOM are normal. Pupils are equal, round, and reactive to light. Left conjunctiva has a hemorrhage ( small, 20%, superior aspect).  Visual acuity  with Snellen Eye Chart: OD=20/20 OS=20/40 OU=20/20  Cardiovascular: Normal rate, regular rhythm, normal heart sounds and intact distal pulses.     ED Course  Procedures (including critical care time) Labs Review Labs Reviewed - No data to display Imaging Review No results found.  EKG Interpretation   None       MDM   1. Subconjunctival hemorrhage of left eye    Patient who recently started xarelto presents with left sided subconjunctival hemorrhage. Visual acuity WNL. No other reports of bleeding. VSS, felt stable for d/c. Recommend f/u with PCP and return for any visual disturbance or other signs of bleeding.    Liliane Bade, NP 11/24/13 1525

## 2013-11-24 NOTE — ED Notes (Signed)
Pt here recently placed on xerelto and noted red area in left eye today; pt sent for eval; pt with recent hx of PE supposed to wear home O2

## 2013-11-24 NOTE — ED Provider Notes (Signed)
Medical screening examination/treatment/procedure(s) were performed by non-physician practitioner and as supervising physician I was immediately available for consultation/collaboration.  EKG Interpretation   None        Orlie Dakin, MD 11/24/13 2041

## 2013-12-01 DIAGNOSIS — N183 Chronic kidney disease, stage 3 unspecified: Secondary | ICD-10-CM | POA: Diagnosis not present

## 2013-12-01 DIAGNOSIS — I5032 Chronic diastolic (congestive) heart failure: Secondary | ICD-10-CM | POA: Diagnosis not present

## 2013-12-01 DIAGNOSIS — I2699 Other pulmonary embolism without acute cor pulmonale: Secondary | ICD-10-CM | POA: Diagnosis not present

## 2013-12-01 DIAGNOSIS — M1990 Unspecified osteoarthritis, unspecified site: Secondary | ICD-10-CM | POA: Diagnosis not present

## 2013-12-01 DIAGNOSIS — I129 Hypertensive chronic kidney disease with stage 1 through stage 4 chronic kidney disease, or unspecified chronic kidney disease: Secondary | ICD-10-CM | POA: Diagnosis not present

## 2013-12-01 DIAGNOSIS — I509 Heart failure, unspecified: Secondary | ICD-10-CM | POA: Diagnosis not present

## 2013-12-02 DIAGNOSIS — I739 Peripheral vascular disease, unspecified: Secondary | ICD-10-CM | POA: Diagnosis not present

## 2013-12-02 DIAGNOSIS — N183 Chronic kidney disease, stage 3 unspecified: Secondary | ICD-10-CM | POA: Diagnosis not present

## 2013-12-02 DIAGNOSIS — Z86711 Personal history of pulmonary embolism: Secondary | ICD-10-CM | POA: Diagnosis not present

## 2013-12-02 DIAGNOSIS — R0989 Other specified symptoms and signs involving the circulatory and respiratory systems: Secondary | ICD-10-CM | POA: Diagnosis not present

## 2013-12-07 ENCOUNTER — Encounter (HOSPITAL_COMMUNITY): Payer: Medicare Other

## 2013-12-07 ENCOUNTER — Ambulatory Visit: Payer: Medicare Other | Admitting: Vascular Surgery

## 2013-12-10 DIAGNOSIS — H1130 Conjunctival hemorrhage, unspecified eye: Secondary | ICD-10-CM | POA: Diagnosis not present

## 2013-12-10 DIAGNOSIS — Z09 Encounter for follow-up examination after completed treatment for conditions other than malignant neoplasm: Secondary | ICD-10-CM | POA: Diagnosis not present

## 2013-12-10 DIAGNOSIS — N183 Chronic kidney disease, stage 3 unspecified: Secondary | ICD-10-CM | POA: Diagnosis not present

## 2013-12-10 DIAGNOSIS — I1310 Hypertensive heart and chronic kidney disease without heart failure, with stage 1 through stage 4 chronic kidney disease, or unspecified chronic kidney disease: Secondary | ICD-10-CM | POA: Diagnosis not present

## 2013-12-10 DIAGNOSIS — E785 Hyperlipidemia, unspecified: Secondary | ICD-10-CM | POA: Diagnosis not present

## 2013-12-10 DIAGNOSIS — I129 Hypertensive chronic kidney disease with stage 1 through stage 4 chronic kidney disease, or unspecified chronic kidney disease: Secondary | ICD-10-CM | POA: Diagnosis not present

## 2013-12-10 DIAGNOSIS — E1165 Type 2 diabetes mellitus with hyperglycemia: Secondary | ICD-10-CM | POA: Diagnosis not present

## 2013-12-10 DIAGNOSIS — E1129 Type 2 diabetes mellitus with other diabetic kidney complication: Secondary | ICD-10-CM | POA: Diagnosis not present

## 2013-12-10 DIAGNOSIS — R609 Edema, unspecified: Secondary | ICD-10-CM | POA: Diagnosis not present

## 2013-12-13 ENCOUNTER — Encounter: Payer: Self-pay | Admitting: Vascular Surgery

## 2013-12-14 ENCOUNTER — Encounter: Payer: Self-pay | Admitting: Vascular Surgery

## 2013-12-14 ENCOUNTER — Ambulatory Visit (INDEPENDENT_AMBULATORY_CARE_PROVIDER_SITE_OTHER)
Admission: RE | Admit: 2013-12-14 | Discharge: 2013-12-14 | Disposition: A | Discharge status: Home / Self Care | Payer: Medicare Other | Source: Ambulatory Visit | Attending: Vascular Surgery | Admitting: Vascular Surgery

## 2013-12-14 ENCOUNTER — Ambulatory Visit (HOSPITAL_COMMUNITY)
Admission: RE | Admit: 2013-12-14 | Discharge: 2013-12-14 | Disposition: A | Discharge status: Home / Self Care | Payer: Medicare Other | Source: Ambulatory Visit | Attending: Vascular Surgery | Admitting: Vascular Surgery

## 2013-12-14 ENCOUNTER — Other Ambulatory Visit (HOSPITAL_COMMUNITY): Payer: Medicare Other

## 2013-12-14 ENCOUNTER — Ambulatory Visit (INDEPENDENT_AMBULATORY_CARE_PROVIDER_SITE_OTHER): Payer: Medicare Other | Admitting: Vascular Surgery

## 2013-12-14 VITALS — BP 105/67 | HR 76 | Resp 16 | Ht 61.5 in | Wt 245.0 lb

## 2013-12-14 DIAGNOSIS — I739 Peripheral vascular disease, unspecified: Secondary | ICD-10-CM | POA: Diagnosis not present

## 2013-12-14 DIAGNOSIS — Z48812 Encounter for surgical aftercare following surgery on the circulatory system: Secondary | ICD-10-CM | POA: Diagnosis not present

## 2013-12-14 DIAGNOSIS — L98499 Non-pressure chronic ulcer of skin of other sites with unspecified severity: Secondary | ICD-10-CM | POA: Insufficient documentation

## 2013-12-14 NOTE — Progress Notes (Signed)
Subjective:     Patient ID: Whitney Walker, female   DOB: [birth year], 57 y.o.   MRN: [id number]  HPI this 57 year old female returns for continued followup regarding his left femoral popliteal vein graft performed August 15. She had ischemia of the left fourth and fifth toes which has healed since her bypass. She has had some edema below the knee which is slowly improving. She did have a pulmonary embolus November 26 and history did by Dr. Nadyne Coombes with [facility].  Past Medical History  Diagnosis Date  . Anxiety   . Arthritis   . COPD (chronic obstructive pulmonary disease)   . Depression   . Hypertension   . Meningitis   . Vertigo   . Colon cancer   . Peripheral vascular disease   . CKD (chronic kidney disease)     Stage III 03/2013  . Shortness of breath   . CHF (congestive heart failure)   . PE (pulmonary embolism)     History  Substance Use Topics  . Smoking status: Current Some Day Smoker -- 40 years    Types: Cigarettes    Last Attempt to Quit: 09/12/2013  . Smokeless tobacco: Never Used     Comment: pt states that she is using the e cigs  . Alcohol Use: Yes     Comment: 2 shots recently    Family History  Problem Relation Age of Onset  . Cancer Mother     breast and bone  . Cancer Father     prostate    Allergies  Allergen Reactions  . Iohexol      Code: HIVES, Desc: HIVES W/ "DYE" USED FOR 1ST CT SCAN BUT NOT 2ND, NO PREMEDS USED, PT UNCERTAIN OF CIRCUMSTANCES,,?POSSIBLE MRI CONTRAST ALLERGY, ALL STUDIES DONE "SOMEWHERE" IN [place]//A.C., Onset Date: [id number]     Current outpatient prescriptions:amLODipine (NORVASC) 10 MG tablet, Take 10 mg by mouth daily., Disp: , Rfl: ;  carvedilol (COREG) 6.25 MG tablet, Take 12.5 mg by mouth daily. , Disp: , Rfl: ;  Cholecalciferol (VITAMIN D-3) 5000 UNITS TABS, Take 5,000 Units by mouth daily., Disp: , Rfl:  Rivaroxaban (XARELTO) 15 MG TABS tablet, Take 15 mg by mouth 2 (two) times daily with a meal. Taking BID x 20 days  - started on 11/18. Will then start 20mg  daily on December 8th, Disp: , Rfl:   BP 105/67  Pulse 76  Resp 16  Ht 5' 1.5" (1.562 m)  Wt 245 lb (111.131 kg)  BMI 45.55 kg/m2  Body mass index is 45.55 kg/(m^2).          Review of Systems has chronic congestive heart failure with dyspnea on exertion and lower extremity edema     Objective:   Physical Exam BP 105/67  Pulse 76  Resp 16  Ht 5' 1.5" (1.562 m)  Wt 245 lb (111.131 kg)  BMI 45.55 kg/m2  General obese female no apparent distress alert and oriented x3 Left lower extremity with 3+ femoral and dorsalis pedis pulse palpable. 1+ edema below the knee. No active ulcerations noted.  Today I ordered ABIs and a duplex scan of the left femoral-popliteal bypass. The bypass grafts widely patent with no evidence of stenosis ABIs 1.03      Assessment:     Widely patent left femoral-popliteal saphenous vein graft with well-healed ischemic ulcers fourth and fifth toes    Plan:     We'll continue to follow the bypass with regular surveillance-return in 3 months for scan of  bypass and ABIs and see nurse practitioner

## 2013-12-15 ENCOUNTER — Encounter: Payer: Self-pay | Admitting: *Deleted

## 2013-12-15 NOTE — Addendum Note (Signed)
Addended by: Mena Goes on: 12/15/2013 10:31 AM   Modules accepted: Orders

## 2013-12-22 DIAGNOSIS — M1990 Unspecified osteoarthritis, unspecified site: Secondary | ICD-10-CM | POA: Diagnosis not present

## 2013-12-22 DIAGNOSIS — I129 Hypertensive chronic kidney disease with stage 1 through stage 4 chronic kidney disease, or unspecified chronic kidney disease: Secondary | ICD-10-CM | POA: Diagnosis not present

## 2013-12-22 DIAGNOSIS — N183 Chronic kidney disease, stage 3 unspecified: Secondary | ICD-10-CM | POA: Diagnosis not present

## 2013-12-22 DIAGNOSIS — I2699 Other pulmonary embolism without acute cor pulmonale: Secondary | ICD-10-CM | POA: Diagnosis not present

## 2013-12-22 DIAGNOSIS — I5032 Chronic diastolic (congestive) heart failure: Secondary | ICD-10-CM | POA: Diagnosis not present

## 2013-12-22 DIAGNOSIS — I509 Heart failure, unspecified: Secondary | ICD-10-CM | POA: Diagnosis not present

## 2014-01-13 DIAGNOSIS — M1990 Unspecified osteoarthritis, unspecified site: Secondary | ICD-10-CM | POA: Diagnosis not present

## 2014-01-13 DIAGNOSIS — I2699 Other pulmonary embolism without acute cor pulmonale: Secondary | ICD-10-CM | POA: Diagnosis not present

## 2014-01-13 DIAGNOSIS — I129 Hypertensive chronic kidney disease with stage 1 through stage 4 chronic kidney disease, or unspecified chronic kidney disease: Secondary | ICD-10-CM | POA: Diagnosis not present

## 2014-01-13 DIAGNOSIS — I509 Heart failure, unspecified: Secondary | ICD-10-CM | POA: Diagnosis not present

## 2014-01-13 DIAGNOSIS — N183 Chronic kidney disease, stage 3 unspecified: Secondary | ICD-10-CM | POA: Diagnosis not present

## 2014-01-13 DIAGNOSIS — I5032 Chronic diastolic (congestive) heart failure: Secondary | ICD-10-CM | POA: Diagnosis not present

## 2014-01-25 DIAGNOSIS — E785 Hyperlipidemia, unspecified: Secondary | ICD-10-CM | POA: Diagnosis not present

## 2014-02-03 DIAGNOSIS — Z86711 Personal history of pulmonary embolism: Secondary | ICD-10-CM | POA: Diagnosis not present

## 2014-02-03 DIAGNOSIS — R0989 Other specified symptoms and signs involving the circulatory and respiratory systems: Secondary | ICD-10-CM | POA: Diagnosis not present

## 2014-02-03 DIAGNOSIS — R079 Chest pain, unspecified: Secondary | ICD-10-CM | POA: Diagnosis not present

## 2014-02-03 DIAGNOSIS — I10 Essential (primary) hypertension: Secondary | ICD-10-CM | POA: Diagnosis not present

## 2014-02-03 DIAGNOSIS — R0609 Other forms of dyspnea: Secondary | ICD-10-CM | POA: Diagnosis not present

## 2014-02-16 ENCOUNTER — Other Ambulatory Visit: Payer: Self-pay | Admitting: Vascular Surgery

## 2014-03-03 DIAGNOSIS — N183 Chronic kidney disease, stage 3 unspecified: Secondary | ICD-10-CM | POA: Diagnosis not present

## 2014-03-09 ENCOUNTER — Encounter (HOSPITAL_COMMUNITY): Payer: Self-pay | Admitting: Emergency Medicine

## 2014-03-09 ENCOUNTER — Emergency Department (HOSPITAL_COMMUNITY): Payer: Medicare Other

## 2014-03-09 ENCOUNTER — Emergency Department (HOSPITAL_COMMUNITY)
Admission: EM | Admit: 2014-03-09 | Discharge: 2014-03-09 | Disposition: A | Discharge status: Home / Self Care | Payer: Medicare Other | Attending: Emergency Medicine | Admitting: Emergency Medicine

## 2014-03-09 DIAGNOSIS — Z8669 Personal history of other diseases of the nervous system and sense organs: Secondary | ICD-10-CM | POA: Insufficient documentation

## 2014-03-09 DIAGNOSIS — N183 Chronic kidney disease, stage 3 unspecified: Secondary | ICD-10-CM | POA: Insufficient documentation

## 2014-03-09 DIAGNOSIS — F17200 Nicotine dependence, unspecified, uncomplicated: Secondary | ICD-10-CM | POA: Diagnosis not present

## 2014-03-09 DIAGNOSIS — Z7901 Long term (current) use of anticoagulants: Secondary | ICD-10-CM | POA: Diagnosis not present

## 2014-03-09 DIAGNOSIS — I129 Hypertensive chronic kidney disease with stage 1 through stage 4 chronic kidney disease, or unspecified chronic kidney disease: Secondary | ICD-10-CM | POA: Insufficient documentation

## 2014-03-09 DIAGNOSIS — Z8659 Personal history of other mental and behavioral disorders: Secondary | ICD-10-CM | POA: Insufficient documentation

## 2014-03-09 DIAGNOSIS — R51 Headache: Secondary | ICD-10-CM

## 2014-03-09 DIAGNOSIS — J449 Chronic obstructive pulmonary disease, unspecified: Secondary | ICD-10-CM | POA: Insufficient documentation

## 2014-03-09 DIAGNOSIS — Z79899 Other long term (current) drug therapy: Secondary | ICD-10-CM | POA: Diagnosis not present

## 2014-03-09 DIAGNOSIS — Z85038 Personal history of other malignant neoplasm of large intestine: Secondary | ICD-10-CM | POA: Insufficient documentation

## 2014-03-09 DIAGNOSIS — R079 Chest pain, unspecified: Secondary | ICD-10-CM | POA: Insufficient documentation

## 2014-03-09 DIAGNOSIS — M542 Cervicalgia: Secondary | ICD-10-CM | POA: Insufficient documentation

## 2014-03-09 DIAGNOSIS — I509 Heart failure, unspecified: Secondary | ICD-10-CM | POA: Insufficient documentation

## 2014-03-09 DIAGNOSIS — R059 Cough, unspecified: Secondary | ICD-10-CM | POA: Diagnosis not present

## 2014-03-09 DIAGNOSIS — Z8739 Personal history of other diseases of the musculoskeletal system and connective tissue: Secondary | ICD-10-CM | POA: Diagnosis not present

## 2014-03-09 DIAGNOSIS — Z86711 Personal history of pulmonary embolism: Secondary | ICD-10-CM | POA: Diagnosis not present

## 2014-03-09 DIAGNOSIS — R05 Cough: Secondary | ICD-10-CM | POA: Diagnosis not present

## 2014-03-09 DIAGNOSIS — J4489 Other specified chronic obstructive pulmonary disease: Secondary | ICD-10-CM | POA: Insufficient documentation

## 2014-03-09 DIAGNOSIS — R519 Headache, unspecified: Secondary | ICD-10-CM

## 2014-03-09 LAB — CBC
HCT: 38.8 % (ref 36.0–46.0)
Hemoglobin: 12.4 g/dL (ref 12.0–15.0)
MCH: 27.1 pg (ref 26.0–34.0)
MCHC: 32 g/dL (ref 30.0–36.0)
MCV: 84.7 fL (ref 78.0–100.0)
Platelets: 273 10*3/uL (ref 150–400)
RBC: 4.58 MIL/uL (ref 3.87–5.11)
RDW: 15.2 % (ref 11.5–15.5)
WBC: 13.8 10*3/uL — ABNORMAL HIGH (ref 4.0–10.5)

## 2014-03-09 LAB — PROTIME-INR
INR: 1.68 — ABNORMAL HIGH (ref 0.00–1.49)
Prothrombin Time: 19.3 seconds — ABNORMAL HIGH (ref 11.6–15.2)

## 2014-03-09 LAB — BASIC METABOLIC PANEL
BUN: 28 mg/dL — ABNORMAL HIGH (ref 6–23)
CO2: 23 mEq/L (ref 19–32)
Calcium: 9.9 mg/dL (ref 8.4–10.5)
Chloride: 107 mEq/L (ref 96–112)
Creatinine, Ser: 1.71 mg/dL — ABNORMAL HIGH (ref 0.50–1.10)
GFR calc Af Amer: 37 mL/min — ABNORMAL LOW (ref >90)
GFR calc non Af Amer: 32 mL/min — ABNORMAL LOW (ref >90)
Glucose, Bld: 143 mg/dL — ABNORMAL HIGH (ref 70–99)
Potassium: 4.8 mEq/L (ref 3.7–5.3)
Sodium: 141 mEq/L (ref 137–147)

## 2014-03-09 LAB — PRO B NATRIURETIC PEPTIDE: Pro B Natriuretic peptide (BNP): 435.7 pg/mL — ABNORMAL HIGH (ref 0–125)

## 2014-03-09 LAB — I-STAT TROPONIN, ED
Troponin i, poc: 0 ng/mL (ref 0.00–0.08)
Troponin i, poc: 0 ng/mL (ref 0.00–0.08)

## 2014-03-09 IMAGING — CR DG CHEST 2V
2 series · 2 of 2 positions shown · non-contrast
Comparison: none

[w chest pa]
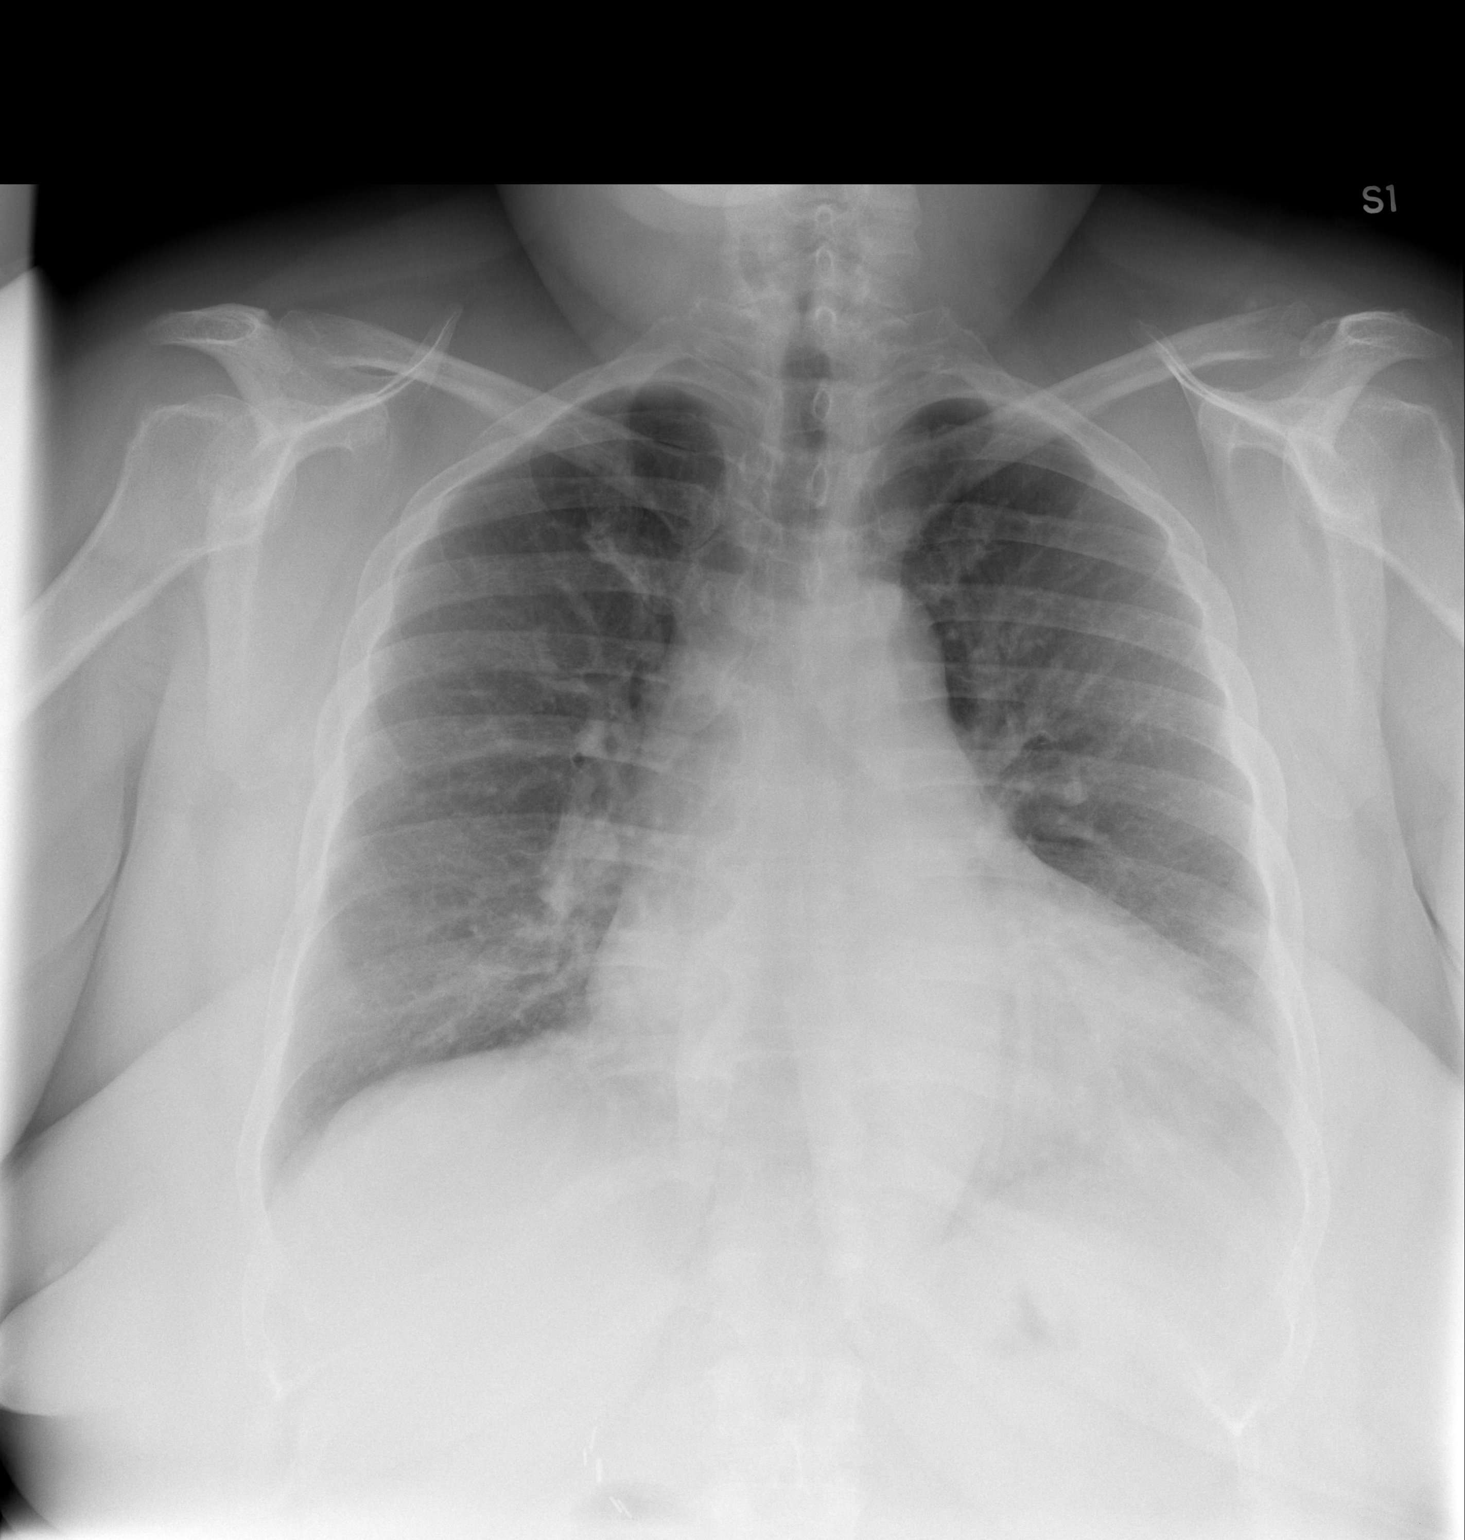

[w chest lat]
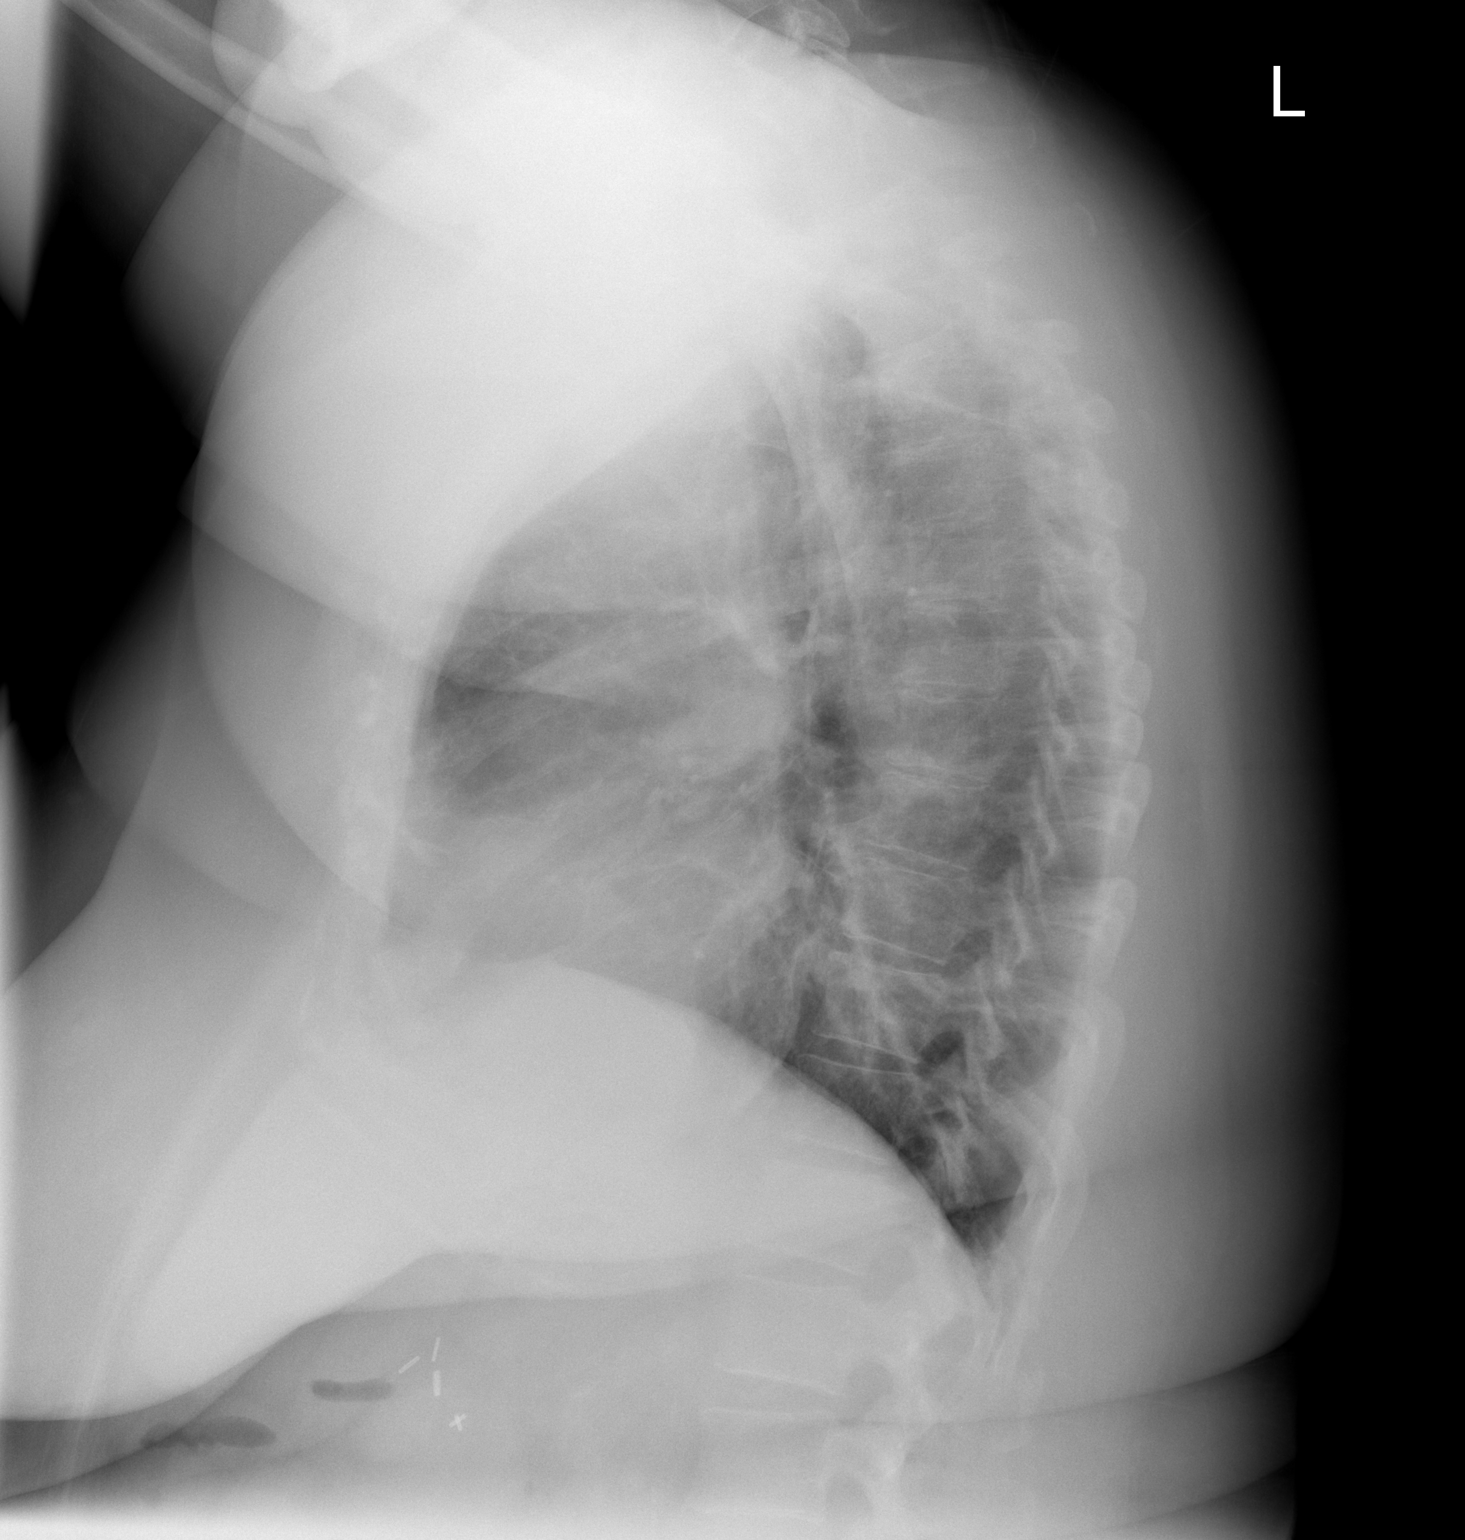

[2 of 2 positions shown; findings below may reference images not displayed]

CLINICAL DATA
Sharp chest pain, cough

EXAM
CHEST  2 VIEW

COMPARISON
DG CHEST 2 VIEW dated [DATE]

FINDINGS
Borderline mild cardiac enlargement stable. Vascular pattern normal.
Minimal scarring anterior lingula, stable. No consolidation or
effusion.

IMPRESSION
No active cardiopulmonary disease.

SIGNATURE

## 2014-03-09 IMAGING — CT CT HEAD W/O CM
1 series · 16 of 27 positions shown, 20 images · non-contrast
Comparison: none

[Series 2: head 5.0 h30s · axial · 0.41mm/px · z∈[+1310,+1430]mm · 16 of 27 slices shown, 20 images]
[im 2/27  brain]
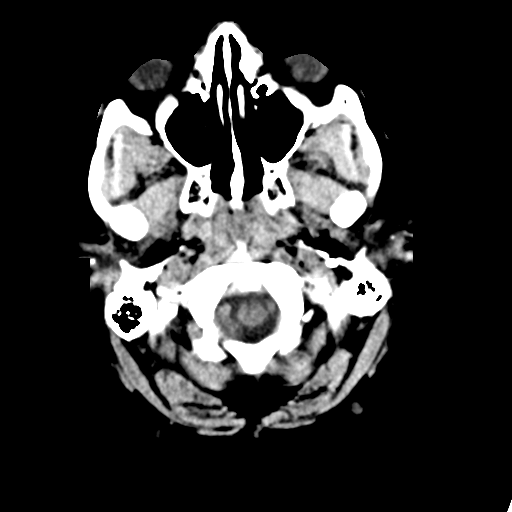
[im 2/27  bone]
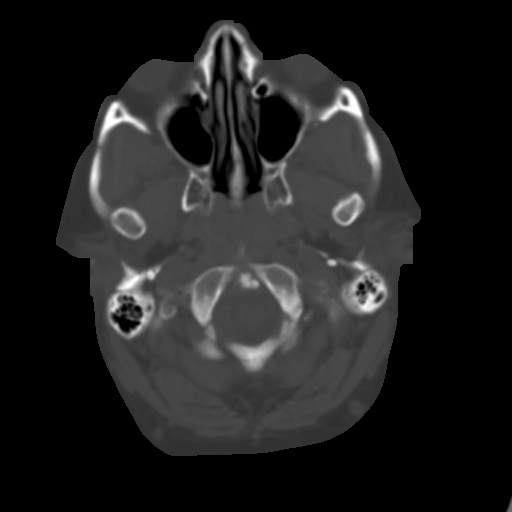
[im 4/27  brain]
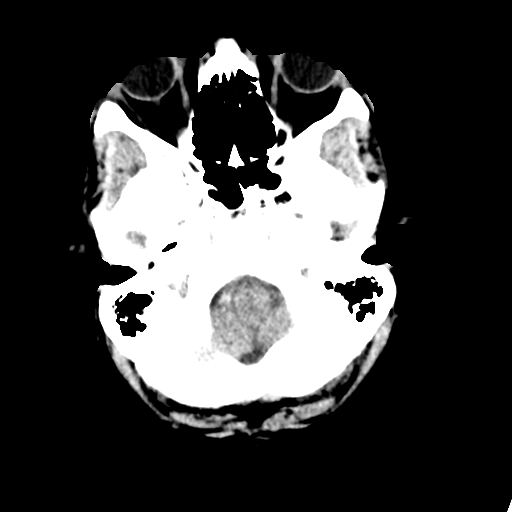
[im 5/27  brain]
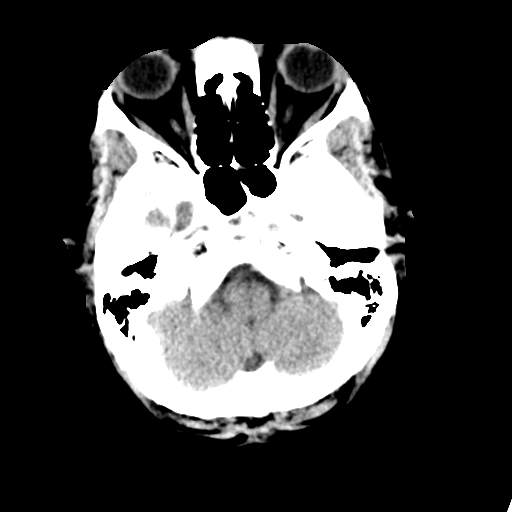
[im 7/27  brain]
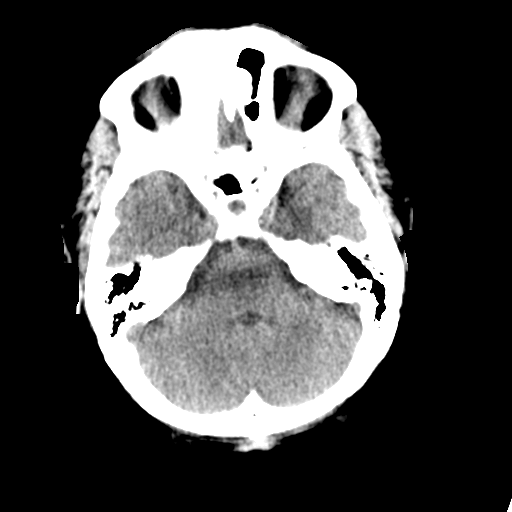
[im 9/27  brain]
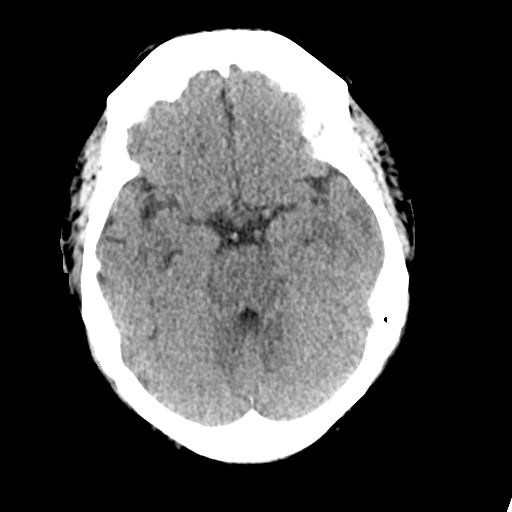
[im 9/27  bone]
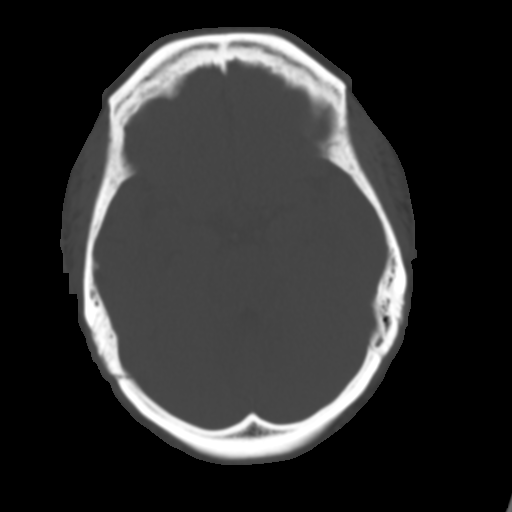
[im 10/27  brain]
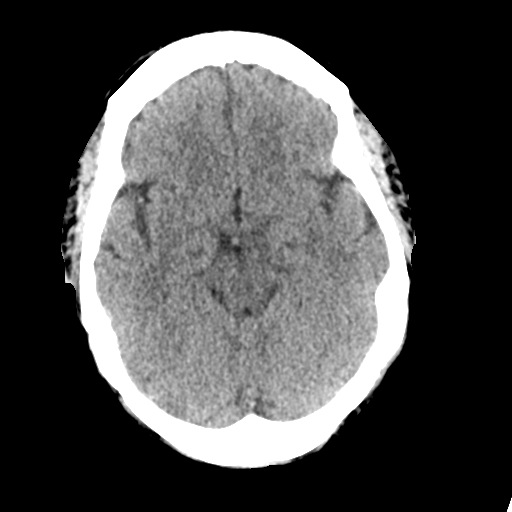
[im 12/27  brain]
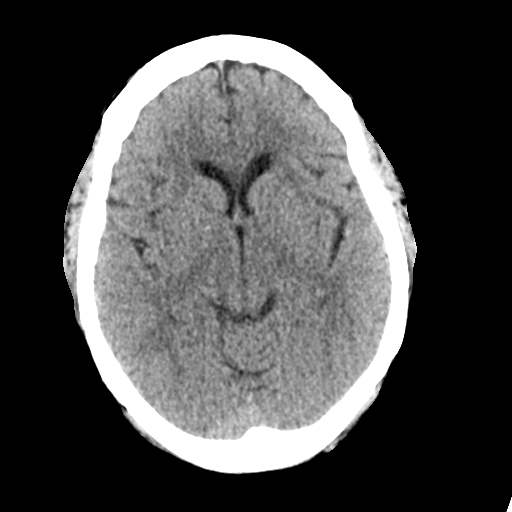
[im 13/27  brain]
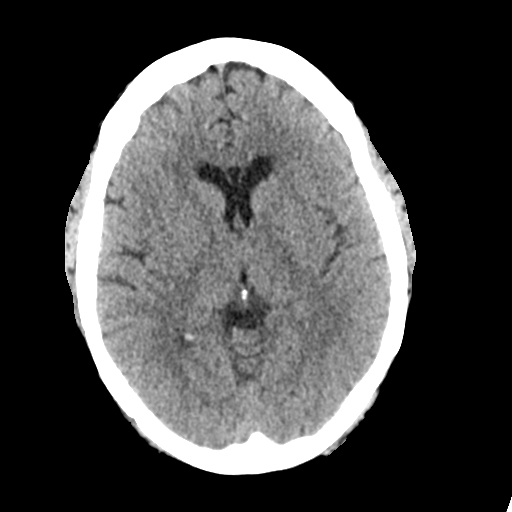
[im 15/27  brain]
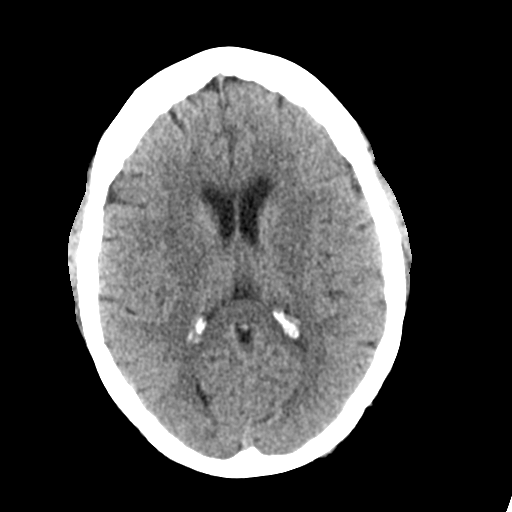
[im 15/27  bone]
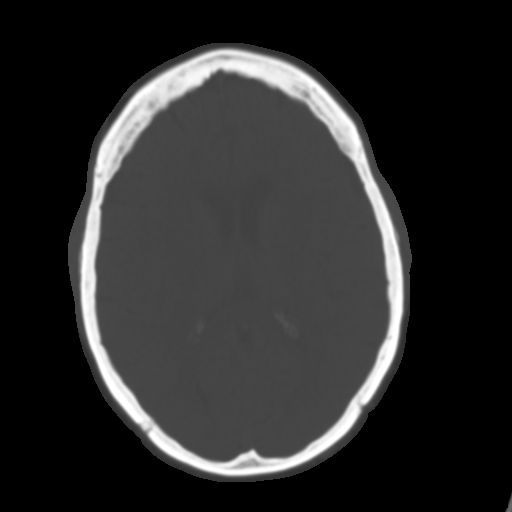
[im 16/27  brain]
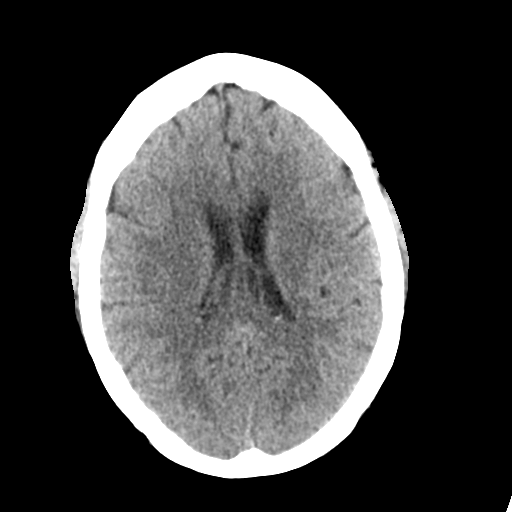
[im 18/27  brain]
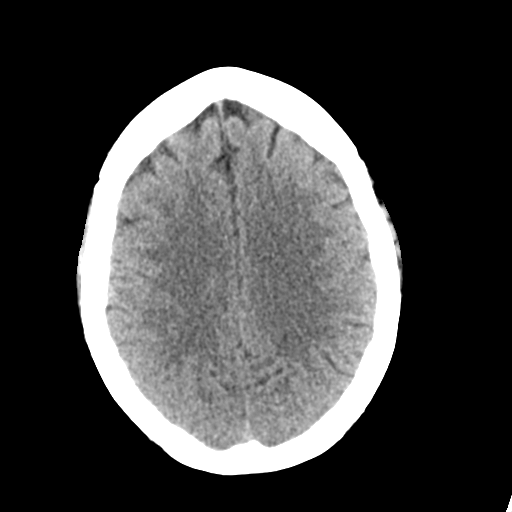
[im 19/27  brain]
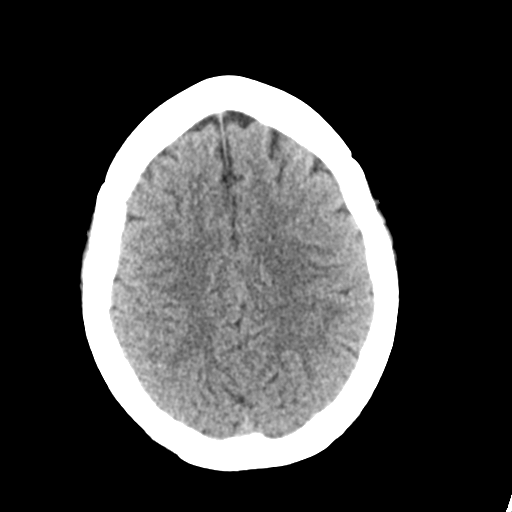
[im 21/27  brain]
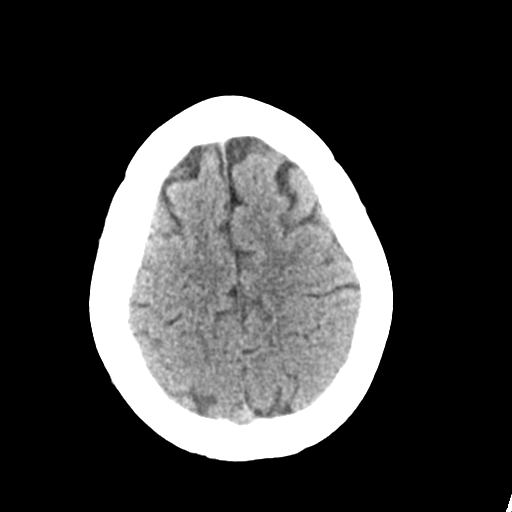
[im 21/27  bone]
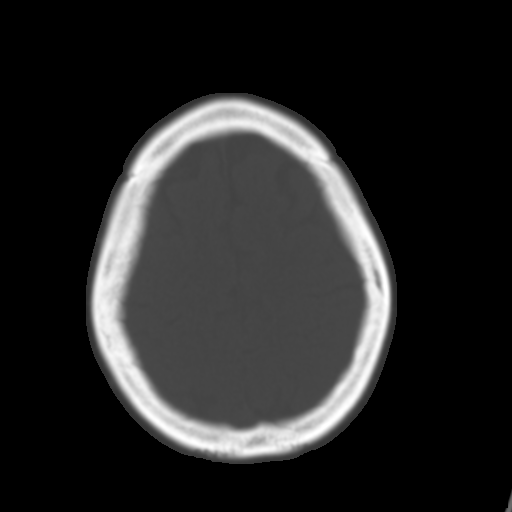
[im 23/27  brain]
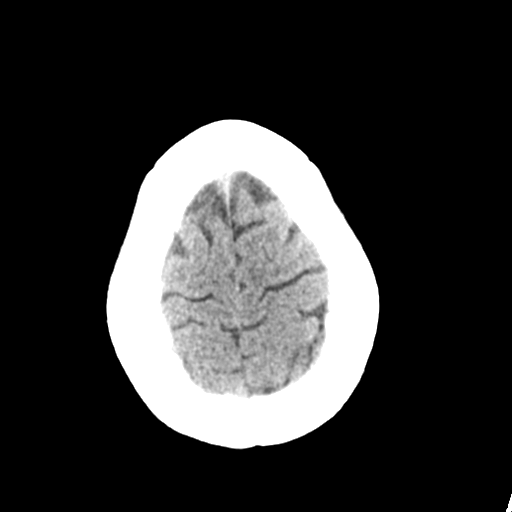
[im 24/27  brain]
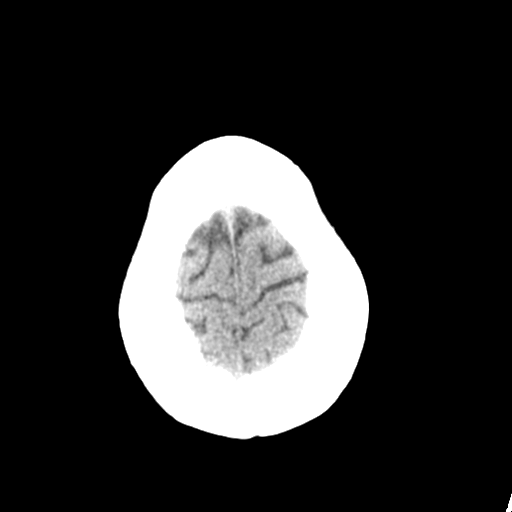
[im 26/27  brain]
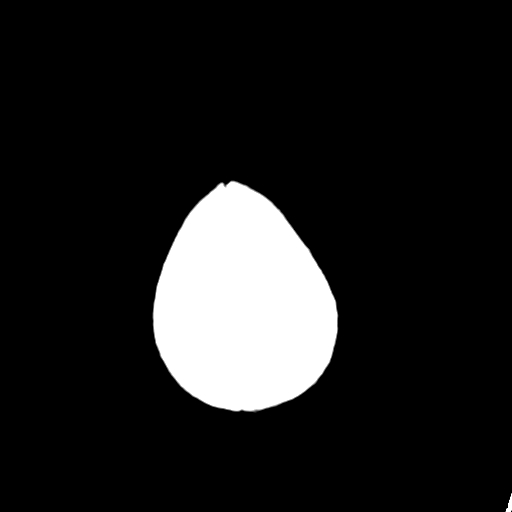

[16 of 27 positions shown; findings below may reference images not displayed]

CLINICAL DATA
Headache.

EXAM
CT HEAD WITHOUT CONTRAST

TECHNIQUE
Contiguous axial images were obtained from the base of the skull
through the vertex without intravenous contrast.

COMPARISON
CT HEAD W/O CM dated [DATE]; CT NECK W/O CM dated [DATE]

FINDINGS
No mass lesion, mass effect, midline shift, hydrocephalus,
hemorrhage. No territorial ischemia or acute infarction. Calvarium
and paranasal sinuses within normal limits.

IMPRESSION
Negative CT head.

SIGNATURE

## 2014-03-09 MED ORDER — MORPHINE SULFATE 4 MG/ML IJ SOLN
4.0000 mg | Freq: Once | INTRAMUSCULAR | Status: AC
  Administered 2014-03-09: 4 mg via INTRAVENOUS
  Filled 2014-03-09: qty 1

## 2014-03-09 MED ORDER — DIAZEPAM 5 MG/ML IJ SOLN
5.0000 mg | Freq: Once | INTRAMUSCULAR | Status: AC
  Administered 2014-03-09: 5 mg via INTRAVENOUS
  Filled 2014-03-09: qty 2

## 2014-03-09 NOTE — ED Notes (Signed)
Pt decided she did want the morphine

## 2014-03-09 NOTE — ED Notes (Signed)
Pt c/o left upper CP that radiates into left shoulder started a week ago. Pt also c/o HA that worsens when moving head around and laying down. Pt called PCP and was told to go to urgent care or ED. Pt sts she has a dry cough. Hx of CHF and CKD, not on dialysis yet. Pt recent dx with PE, so she started Xarelto. Nad, skin warm and dry, resp e/u.

## 2014-03-09 NOTE — ED Notes (Signed)
Report to Rhoderick Moody

## 2014-03-09 NOTE — ED Provider Notes (Signed)
CSN: [id number]     Arrival date & time 03/09/14  1406 History   First MD Initiated Contact with Patient 03/09/14 1543     Chief Complaint  Patient presents with  . Chest Pain     (Consider location/radiation/quality/duration/timing/severity/associated sxs/prior Treatment) Patient is a 58 y.o. female presenting with chest pain. The history is provided by the patient and medical records.  Chest Pain Associated symptoms: headache    This is a 58 year old female with past medical history significant for hypertension, congestive heart failure, chronic kidney disease, vertigo, peripheral vascular disease, presenting to the ED for chest pain. Pain is described as an intermittent "sharp twinge" that radiates to her left shoulder.  States pain only lasts a few seconds before resolving completely.  Patient denies any recent weight gain. No reported shortness of breath, palpitations, diaphoresis, nausea, vomiting.  Pt denies any current chest pain.  Patient also complains of a headache localized to her left occipital region and left posterior neck with radiation into left posterior shoulder.  States if she leans her head to the right, pain is relieved. States she has been taking Tylenol which helps the pain but does not completely resolve it.  Patient denies any photophobia, phonophobia, aura, tinnitus, dizziness, changes in speech, or confusion. No recent fevers or chills.  No prior hx of TIA or stroke.  Pt is on daily xarelto for prior PE.  Past Medical History  Diagnosis Date  . Anxiety   . Arthritis   . COPD (chronic obstructive pulmonary disease)   . Depression   . Hypertension   . Meningitis   . Vertigo   . Colon cancer   . Peripheral vascular disease   . CKD (chronic kidney disease)     Stage III 03/2013  . Shortness of breath   . CHF (congestive heart failure)   . PE (pulmonary embolism)    Past Surgical History  Procedure Laterality Date  . Cesarean section      X 3 [id number]  .  Cholecystectomy    . Abdominal hysterectomy    . Diverticulosis surgery-2002  2012  . Colectomy  2010  . Cholecystectomy  1980/s  . Femoral-popliteal bypass graft Left 08/13/2013    Procedure: BYPASS GRAFT FEMORAL-POPLITEAL ARTERY WITH NON-REVERSED SAPHANEOUS VEIN; ULTRASOUND GUIDED;  Surgeon: Mal [name], MD;  Location: [facility] OR;  Service: Vascular;  Laterality: Left;   Family History  Problem Relation Age of Onset  . Cancer Mother     breast and bone  . Cancer Father     prostate   History  Substance Use Topics  . Smoking status: Current Some Day Smoker -- 40 years    Types: Cigarettes    Last Attempt to Quit: 09/12/2013  . Smokeless tobacco: Never Used     Comment: pt states that she is using the e cigs  . Alcohol Use: Yes     Comment: 2 shots recently   OB History   Grav Para Term Preterm Abortions TAB SAB Ect Mult Living                 Review of Systems  Cardiovascular: Positive for chest pain.  Musculoskeletal: Positive for neck pain.  Neurological: Positive for headaches.  All other systems reviewed and are negative.      Allergies  Iohexol  Home Medications   Current Outpatient Rx  Name  Route  Sig  Dispense  Refill  . acetaminophen (TYLENOL) 325 MG tablet   Oral  Take 650 mg by mouth every 6 (six) hours as needed for mild pain, moderate pain, fever or headache.         [name] amLODipine (NORVASC) 10 MG tablet   Oral   Take 10 mg by mouth daily.         [name] atorvastatin (LIPITOR) 10 MG tablet   Oral   Take 10 mg by mouth daily.         . carvedilol (COREG) 6.25 MG tablet   Oral   Take 12.5 mg by mouth 2 (two) times daily with a meal.          . Cholecalciferol (VITAMIN D-3) 5000 UNITS TABS   Oral   Take 5,000 Units by mouth daily.         . Rivaroxaban (XARELTO) 20 MG TABS tablet   Oral   Take 20 mg by mouth daily with supper.          BP 138/76  Pulse 80  Temp(Src) 98.7 F (37.1 C) (Oral)  Resp 18  SpO2 94%  Physical Exam   Nursing note and vitals reviewed. Constitutional: She is oriented to person, place, and time. She appears well-developed and well-nourished.  HENT:  Head: Normocephalic and atraumatic.  Mouth/Throat: Oropharynx is clear and moist.  Eyes: Conjunctivae and EOM are normal. Pupils are equal, round, and reactive to light.  Neck: Normal range of motion and full passive range of motion without pain. Neck supple. Muscular tenderness present. No rigidity.  Tenderness to palpation of left cervical paraspinal muscles and trapezius w/spasm present, full range of motion maintained without difficulty, no nuchal rigidity  Cardiovascular: Normal rate, regular rhythm and normal heart sounds.   Pulmonary/Chest: Effort normal and breath sounds normal. No respiratory distress. She has no wheezes.  Abdominal: Soft. Bowel sounds are normal. There is no tenderness. There is no guarding.  Musculoskeletal: Normal range of motion.  1+ pitting edema BLE  Neurological: She is alert and oriented to person, place, and time. She has normal strength. She displays no tremor. No cranial nerve deficit or sensory deficit. She displays no seizure activity.  AAOx3, answering questions appropriately; equal strength UE and LE bilaterally; CN grossly intact; moves all extremities appropriately without ataxia; no focal neuro deficits or facial asymmetry appreciated  Skin: Skin is warm and dry.  Psychiatric: She has a normal mood and affect.    ED Course  Procedures (including critical care time) Labs Review Labs Reviewed  CBC - Abnormal; Notable for the following:    WBC 13.8 (*)    All other components within normal limits  BASIC METABOLIC PANEL - Abnormal; Notable for the following:    Glucose, Bld 143 (*)    BUN 28 (*)    Creatinine, Ser 1.71 (*)    GFR calc non Af Amer 32 (*)    GFR calc Af Amer 37 (*)    All other components within normal limits  PROTIME-INR - Abnormal; Notable for the following:    Prothrombin Time  19.3 (*)    INR 1.68 (*)    All other components within normal limits  PRO B NATRIURETIC PEPTIDE - Abnormal; Notable for the following:    Pro B Natriuretic peptide (BNP) 435.7 (*)    All other components within normal limits  I-STAT TROPOININ, ED   Imaging Review Dg Chest 2 View  03/09/2014   CLINICAL DATA Sharp chest pain, cough  EXAM CHEST  2 VIEW  COMPARISON DG CHEST 2 VIEW dated 11/14/2013  FINDINGS Borderline mild cardiac enlargement stable. Vascular pattern normal. Minimal scarring anterior lingula, stable. No consolidation or effusion.  IMPRESSION No active cardiopulmonary disease.  SIGNATURE  Electronically Signed   By: Skipper Cliche M.D.   On: 03/09/2014 15:35     EKG Interpretation   Date/Time:  Wednesday March 09 2014 14:12:18 EDT Ventricular Rate:  83 PR Interval:  202 QRS Duration: 64 QT Interval:  360 QTC Calculation: 423 R Axis:   -32 Text Interpretation:  Normal sinus rhythm Left axis deviation Abnormal ECG  Confirmed by BEATON  MD, ROBERT ([id number]) on 03/09/2014 5:26:06 PM      MDM   Final diagnoses:  CKD (chronic kidney disease) stage 3, GFR 30-59 ml/min  Headache   EKG normal sinus rhythm, no acute ischemic changes. Troponin is negative. Chest x-ray is clear. Labs as above.  Headache is without focal neuro deficits. At this time I have low suspicion for [facility], [facility], TIA, stroke, or meningitis however given anti-coagulant use will obtain CT head. She does have muscle spasms along her left cervical paraspinal region and trapezius, which is likely the source of her headache and neck pain.  Patient also with intermittent, nonspecific chest pain. Patient is currently on xarelto, risk of PE low at this time.  Given patient's history, will obtain three-hour delta troponin.  VS remains stable.   7:08 PM Head CT negative for acute findings.  Delta trop negative.  Pt states headache resvoled after morphine and valium.  At this time I have low suspicion for ACS, PE,  dissection, or other acute cardiac. Patient has remained chest pain-free while in the emergency department today.  I have encouraged close followup with PCP and cardiologist to discuss this ED visit.  Discussed plan with pt, she acknowledged understanding and agreed with plan of care.  Strict return precautions advised for new or worsening symptoms.  Discussed with Dr. Audie Pinto who agrees with assessment and plan of care.  Larene Pickett, PA-C 03/09/14 2312

## 2014-03-09 NOTE — ED Notes (Signed)
Pt sts that she does not want morphine.

## 2014-03-09 NOTE — Discharge Instructions (Signed)
Headaches, Frequently Asked Questions MIGRAINE HEADACHES Q: What is migraine? What causes it? How can I treat it? A: Generally, migraine headaches begin as a dull ache. Then they develop into a constant, throbbing, and pulsating pain. You may experience pain at the temples. You may experience pain at the front or back of one or both sides of the head. The pain is usually accompanied by a combination of:  Nausea.  Vomiting.  Sensitivity to light and noise. Some people (about 15%) experience an aura (see below) before an attack. The cause of migraine is believed to be chemical reactions in the brain. Treatment for migraine may include over-the-counter or prescription medications. It may also include self-help techniques. These include relaxation training and biofeedback.  Q: What is an aura? A: About 15% of people with migraine get an "aura". This is a sign of neurological symptoms that occur before a migraine headache. You may see wavy or jagged lines, dots, or flashing lights. You might experience tunnel vision or blind spots in one or both eyes. The aura can include visual or auditory hallucinations (something imagined). It may include disruptions in smell (such as strange odors), taste or touch. Other symptoms include:  Numbness.  A "pins and needles" sensation.  Difficulty in recalling or speaking the correct word. These neurological events may last as long as 60 minutes. These symptoms will fade as the headache begins. Q: What is a trigger? A: Certain physical or environmental factors can lead to or "trigger" a migraine. These include:  Foods.  Hormonal changes.  Weather.  Stress. It is important to remember that triggers are different for everyone. To help prevent migraine attacks, you need to figure out which triggers affect you. Keep a headache diary. This is a good way to track triggers. The diary will help you talk to your healthcare professional about your condition. Q: Does  weather affect migraines? A: Bright sunshine, hot, humid conditions, and drastic changes in barometric pressure may lead to, or "trigger," a migraine attack in some people. But studies have shown that weather does not act as a trigger for everyone with migraines. Q: What is the link between migraine and hormones? A: Hormones start and regulate many of your body's functions. Hormones keep your body in balance within a constantly changing environment. The levels of hormones in your body are unbalanced at times. Examples are during menstruation, pregnancy, or menopause. That can lead to a migraine attack. In fact, about three quarters of all women with migraine report that their attacks are related to the menstrual cycle.  Q: Is there an increased risk of stroke for migraine sufferers? A: The likelihood of a migraine attack causing a stroke is very remote. That is not to say that migraine sufferers cannot have a stroke associated with their migraines. In persons under age 53, the most common associated factor for stroke is migraine headache. But over the course of a person's normal life span, the occurrence of migraine headache may actually be associated with a reduced risk of dying from cerebrovascular disease due to stroke.  Q: What are acute medications for migraine? A: Acute medications are used to treat the pain of the headache after it has started. Examples over-the-counter medications, NSAIDs, ergots, and triptans.  Q: What are the triptans? A: Triptans are the newest class of abortive medications. They are specifically targeted to treat migraine. Triptans are vasoconstrictors. They moderate some chemical reactions in the brain. The triptans work on receptors in your brain. Triptans help  to restore the balance of a neurotransmitter called serotonin. Fluctuations in levels of serotonin are thought to be a main cause of migraine.  °Q: Are over-the-counter medications for migraine effective? °A:  Over-the-counter, or "OTC," medications may be effective in relieving mild to moderate pain and associated symptoms of migraine. But you should see your caregiver before beginning any treatment regimen for migraine.  °Q: What are preventive medications for migraine? °A: Preventive medications for migraine are sometimes referred to as "prophylactic" treatments. They are used to reduce the frequency, severity, and length of migraine attacks. Examples of preventive medications include antiepileptic medications, antidepressants, beta-blockers, calcium channel blockers, and NSAIDs (nonsteroidal anti-inflammatory drugs). °Q: Why are anticonvulsants used to treat migraine? °A: During the past few years, there has been an increased interest in antiepileptic drugs for the prevention of migraine. They are sometimes referred to as "anticonvulsants". Both epilepsy and migraine may be caused by similar reactions in the brain.  °Q: Why are antidepressants used to treat migraine? °A: Antidepressants are typically used to treat people with depression. They may reduce migraine frequency by regulating chemical levels, such as serotonin, in the brain.  °Q: What alternative therapies are used to treat migraine? °A: The term "alternative therapies" is often used to describe treatments considered outside the scope of conventional Western medicine. Examples of alternative therapy include acupuncture, acupressure, and yoga. Another common alternative treatment is herbal therapy. Some herbs are believed to relieve headache pain. Always discuss alternative therapies with your caregiver before proceeding. Some herbal products contain arsenic and other toxins. °TENSION HEADACHES °Q: What is a tension-type headache? What causes it? How can I treat it? °A: Tension-type headaches occur randomly. They are often the result of temporary stress, anxiety, fatigue, or anger. Symptoms include soreness in your temples, a tightening band-like sensation  around your head (a "vice-like" ache). Symptoms can also include a pulling feeling, pressure sensations, and contracting head and neck muscles. The headache begins in your forehead, temples, or the back of your head and neck. Treatment for tension-type headache may include over-the-counter or prescription medications. Treatment may also include self-help techniques such as relaxation training and biofeedback. °CLUSTER HEADACHES °Q: What is a cluster headache? What causes it? How can I treat it? °A: Cluster headache gets its name because the attacks come in groups. The pain arrives with little, if any, warning. It is usually on one side of the head. A tearing or bloodshot eye and a runny nose on the same side of the headache may also accompany the pain. Cluster headaches are believed to be caused by chemical reactions in the brain. They have been described as the most severe and intense of any headache type. Treatment for cluster headache includes prescription medication and oxygen. °SINUS HEADACHES °Q: What is a sinus headache? What causes it? How can I treat it? °A: When a cavity in the bones of the face and skull (a sinus) becomes inflamed, the inflammation will cause localized pain. This condition is usually the result of an allergic reaction, a tumor, or an infection. If your headache is caused by a sinus blockage, such as an infection, you will probably have a fever. An x-ray will confirm a sinus blockage. Your caregiver's treatment might include antibiotics for the infection, as well as antihistamines or decongestants.  °REBOUND HEADACHES °Q: What is a rebound headache? What causes it? How can I treat it? °A: A pattern of taking acute headache medications too often can lead to a condition known as "rebound headache."   A pattern of taking too much headache medication includes taking it more than 2 days per week or in excessive amounts. That means more than the label or a caregiver advises. With rebound  headaches, your medications not only stop relieving pain, they actually begin to cause headaches. Doctors treat rebound headache by tapering the medication that is being overused. Sometimes your caregiver will gradually substitute a different type of treatment or medication. Stopping may be a challenge. Regularly overusing a medication increases the potential for serious side effects. Consult a caregiver if you regularly use headache medications more than 2 days per week or more than the label advises. ADDITIONAL QUESTIONS AND ANSWERS Q: What is biofeedback? A: Biofeedback is a self-help treatment. Biofeedback uses special equipment to monitor your body's involuntary physical responses. Biofeedback monitors:  Breathing.  Pulse.  Heart rate.  Temperature.  Muscle tension.  Brain activity. Biofeedback helps you refine and perfect your relaxation exercises. You learn to control the physical responses that are related to stress. Once the technique has been mastered, you do not need the equipment any more. Q: Are headaches hereditary? A: Four out of five (80%) of people that suffer report a family history of migraine. Scientists are not sure if this is genetic or a family predisposition. Despite the uncertainty, a child has a 50% chance of having migraine if one parent suffers. The child has a 75% chance if both parents suffer.  Q: Can children get headaches? A: By the time they reach high school, most young people have experienced some type of headache. Many safe and effective approaches or medications can prevent a headache from occurring or stop it after it has begun.  Q: What type of doctor should I see to diagnose and treat my headache? A: Start with your primary caregiver. Discuss his or her experience and approach to headaches. Discuss methods of classification, diagnosis, and treatment. Your caregiver may decide to recommend you to a headache specialist, depending upon your symptoms or other  physical conditions. Having diabetes, allergies, etc., may require a more comprehensive and inclusive approach to your headache. The National Headache Foundation will provide, upon request, a list of [facility] physician members in your state. Document Released: 03/07/2004 Document Revised: 03/09/2012 Document Reviewed: 08/15/2008 [facility] Patient Information 2014 [place].  Kidney Disease, Adult The kidneys are two organs that lie on either side of the spine between the middle of the back and the front of the abdomen. The kidneys:   Remove wastes and extra water from the blood.   Produce important hormones. These regulate blood pressure, help keep bones strong, and help create red blood cells.   Balance the fluids and chemicals in the blood and tissues. Kidney disease occurs when the kidneys are damaged. Kidney damage may be sudden (acute) or develop over a long period (chronic). A small amount of damage may not cause problems, but a large amount of damage may make it difficult or impossible for the kidneys to work the way they should. Early detection and treatment of kidney disease may prevent kidney damage from becoming permanent or getting worse. Some kidney diseases are curable, but most are not. Many people with kidney disease are able to control the disease and live a normal life.  TYPES OF KIDNEY DISEASE  Acute kidney injury.Acute kidney injury occurs when there is sudden damage to the kidneys.  Chronic kidney disease. Chronic kidney disease occurs when the kidneys are damaged over a long period.  End-stage kidney disease. End-stage kidney  disease occurs when the kidneys are so damaged that they stop working. In end-stage kidney disease, the kidneys cannot get better. CAUSES Any condition, disease, or event that damages the kidneys may cause kidney disease. Acute kidney injury.  A problem with blood flow to the kidneys. This may be caused by:   Blood loss.   Heart disease.    Severe burns.   Liver disease.  Direct damage to the kidneys. This may be caused by:  Some medicines.   A kidney infection.   Poisoning or consuming toxic substances.   A surgical wound.   A blow to the kidney area.   A problem with urine flow. This may be caused by:   Cancer.   Kidney stones.   An enlarged prostate. Chronic kidney disease. The most common causes of chronic kidney disease are diabetes and high blood pressure (hypertension). Chronic kidney disease may also be caused by:   Diseases that cause the filtering units of the kidneys to become inflamed.   Diseases that affect the immune system.   Genetic diseases.   Medicines that damage the kidneys, such as anti-inflammatory medicines.  Poisoning or exposure to toxic substances.   A reoccurring kidney or urinary infection.   A problem with urine flow. This may be caused by:  Cancer.   Kidney stones.   An enlarged prostate in males. End-stage kidney disease. This kidney disease usually occurs when a chronic kidney disease gets worse. It may also occur after acute kidney injury.  SYMPTOMS   Swelling (edema) of the legs, ankles, or feet.   Tiredness (lethargy).   Nausea or vomiting.   Confusion.   Problems with urination, such as:   Painful or burning feeling during urination.   Decreased urine production.  Bloody urine.   Frequent urination, especially at night.  Hypertension.  Muscle twitches and cramps.   Shortness of breath.   Persistent itchiness.   Loss of appetite.  Metallic taste in the mouth.   Weakness.   Seizures.   Chest pain or pressure.   Trouble sleeping.   Headaches.   Abnormally dark or light skin.   Numbness in the hands or feet.   Easy bruising.   Frequent hiccups.   Menstruation stops. Sometimes, no symptoms are present. DIAGNOSIS  Kidney disease may be detected and diagnosed by tests, including  blood, urine, imaging, or kidney biopsy tests.  TREATMENT  Acute kidney injury. Treatment of acute kidney injury varies depending on the cause and severity of the kidney damage. In mild cases, no treatment may be needed. The kidneys may heal on their own. If acute kidney injury is more severe, your caregiver will treat the cause of the kidney damage, help the kidneys heal, and prevent complications from occurring. Severe cases may require a procedure to remove toxic wastes from the body (dialysis) or surgery to repair kidney damage. Surgery may involve:   Repair of a torn kidney.   Removal of an obstruction.  Most of the time, you will need to stay overnight at the hospital.  Chronic kidney disease. Most chronic kidney diseases cannot be cured. Treatment usually involves relieving symptoms and preventing or slowing the progression of the disease. Treatment may include:   A special diet. You may need to avoid alcohol and foods that:   Have added salt.   Are high in potassium.   Are high in protein.   Medicines. These may:   Lower blood pressure.   Relieve anemia.   Relieve swelling.  Protect the bones.  End-stage kidney disease. End-stage kidney disease is life-threatening and must be treated immediately. There are two treatments for end-stage kidney disease:   Dialysis.   Receiving a new kidney (kidney transplant). Both of these treatments have serious risks and consequences. In addition to having dialysis or a kidney transplant, you may need to take medicines to control hypertension and cholesterol and to decrease phosphorus levels in your blood. LENGTH OF ILLNESS  Acute kidney injury.The length of this disease varies greatly from person to person. Exactly how long it lasts depends on the cause of the kidney damage. Acute kidney injury may develop into chronic kidney disease or end-stage kidney disease.  Chronic kidney disease. This disease usually lasts a  lifetime. Chronic kidney disease may worsen over time to become end-stage kidney disease. The time it takes for end-stage kidney disease to develop varies from person to person.  End-stage kidney disease. This disease lasts until a kidney transplant is performed. PREVENTION  Kidney disease can sometimes be prevented. If you have diabetes, hypertension, or any other condition that may lead to kidney disease, you should try to prevent kidney disease with:   An appropriate diet.  Medicine.  Lifestyle changes. FOR MORE INFORMATION  American Association of Kidney Patients: BombTimer.gl  National Kidney Foundation: www.kidney.[place]: https://mathis.com/  [facility]: www.lifeoptions.org and www.kidneyschool.org  Document Released: 12/16/2005 Document Revised: 12/02/2012 Document Reviewed: 08/14/2012 [facility] Patient Information 2014 [place], [place].

## 2014-03-09 NOTE — ED Notes (Signed)
Pt sts feeling some better after med given

## 2014-03-09 NOTE — ED Notes (Signed)
Pt upset at time of discharge. Pt left her discharge paperwork on the bedside table.

## 2014-03-14 DIAGNOSIS — Z09 Encounter for follow-up examination after completed treatment for conditions other than malignant neoplasm: Secondary | ICD-10-CM | POA: Diagnosis not present

## 2014-03-14 DIAGNOSIS — M542 Cervicalgia: Secondary | ICD-10-CM | POA: Diagnosis not present

## 2014-03-14 DIAGNOSIS — M25519 Pain in unspecified shoulder: Secondary | ICD-10-CM | POA: Diagnosis not present

## 2014-03-21 NOTE — ED Provider Notes (Signed)
Medical screening examination/treatment/procedure(s) were performed by non-physician practitioner and as supervising physician I was immediately available for consultation/collaboration.   Anushree Dorsi L Monserrath Junio, MD 03/21/14 1121 

## 2014-03-22 ENCOUNTER — Encounter: Payer: Self-pay | Admitting: Family

## 2014-03-23 ENCOUNTER — Ambulatory Visit (INDEPENDENT_AMBULATORY_CARE_PROVIDER_SITE_OTHER): Payer: Medicare Other | Admitting: Family

## 2014-03-23 ENCOUNTER — Ambulatory Visit (INDEPENDENT_AMBULATORY_CARE_PROVIDER_SITE_OTHER)
Admission: RE | Admit: 2014-03-23 | Discharge: 2014-03-23 | Disposition: A | Discharge status: Home / Self Care | Payer: Medicare Other | Source: Ambulatory Visit | Attending: Family | Admitting: Family

## 2014-03-23 ENCOUNTER — Encounter: Payer: Self-pay | Admitting: Family

## 2014-03-23 ENCOUNTER — Ambulatory Visit (HOSPITAL_COMMUNITY)
Admission: RE | Admit: 2014-03-23 | Discharge: 2014-03-23 | Disposition: A | Discharge status: Home / Self Care | Payer: Medicare Other | Source: Ambulatory Visit | Attending: Family | Admitting: Family

## 2014-03-23 VITALS — BP 152/82 | HR 78 | Resp 16 | Ht 62.0 in | Wt 248.0 lb

## 2014-03-23 DIAGNOSIS — M79609 Pain in unspecified limb: Secondary | ICD-10-CM | POA: Diagnosis not present

## 2014-03-23 DIAGNOSIS — R5381 Other malaise: Secondary | ICD-10-CM | POA: Diagnosis not present

## 2014-03-23 DIAGNOSIS — Z48812 Encounter for surgical aftercare following surgery on the circulatory system: Secondary | ICD-10-CM | POA: Diagnosis not present

## 2014-03-23 DIAGNOSIS — I739 Peripheral vascular disease, unspecified: Secondary | ICD-10-CM

## 2014-03-23 DIAGNOSIS — M7989 Other specified soft tissue disorders: Secondary | ICD-10-CM

## 2014-03-23 DIAGNOSIS — R5383 Other fatigue: Secondary | ICD-10-CM

## 2014-03-23 DIAGNOSIS — R531 Weakness: Secondary | ICD-10-CM

## 2014-03-23 NOTE — Patient Instructions (Addendum)
Peripheral Vascular Disease Peripheral Vascular Disease (PVD), also called Peripheral Arterial Disease (PAD), is a circulation problem caused by cholesterol (atherosclerotic plaque) deposits in the arteries. PVD commonly occurs in the lower extremities (legs) but it can occur in other areas of the body, such as your arms. The cholesterol buildup in the arteries reduces blood flow which can cause pain and other serious problems. The presence of PVD can place a person at risk for Coronary Artery Disease (CAD).  CAUSES  Causes of PVD can be many. It is usually associated with more than one risk factor such as:   High Cholesterol.  Smoking.  Diabetes.  Lack of exercise or inactivity.  High blood pressure (hypertension).  Obesity.  Family history. SYMPTOMS   When the lower extremities are affected, patients with PVD may experience:  Leg pain with exertion or physical activity. This is called INTERMITTENT CLAUDICATION. This may present as cramping or numbness with physical activity. The location of the pain is associated with the level of blockage. For example, blockage at the abdominal level (distal abdominal aorta) may result in buttock or hip pain. Lower leg arterial blockage may result in calf pain.  As PVD becomes more severe, pain can develop with less physical activity.  In people with severe PVD, leg pain may occur at rest.  Other PVD signs and symptoms:  Leg numbness or weakness.  Coldness in the affected leg or foot, especially when compared to the other leg.  A change in leg color.  Patients with significant PVD are more prone to ulcers or sores on toes, feet or legs. These may take longer to heal or may reoccur. The ulcers or sores can become infected.  If signs and symptoms of PVD are ignored, gangrene may occur. This can result in the loss of toes or loss of an entire limb.  Not all leg pain is related to PVD. Other medical conditions can cause leg pain such  as:  Blood clots (embolism) or Deep Vein Thrombosis.  Inflammation of the blood vessels (vasculitis).  Spinal stenosis. DIAGNOSIS  Diagnosis of PVD can involve several different types of tests. These can include:  Pulse Volume Recording Method (PVR). This test is simple, painless and does not involve the use of X-rays. PVR involves measuring and comparing the blood pressure in the arms and legs. An ABI (Ankle-Brachial Index) is calculated. The normal ratio of blood pressures is 1. As this number becomes smaller, it indicates more severe disease.  < 0.95  indicates significant narrowing in one or more leg vessels.  <0.8 there will usually be pain in the foot, leg or buttock with exercise.  <0.4 will usually have pain in the legs at rest.  <0.25  usually indicates limb threatening PVD.  Doppler detection of pulses in the legs. This test is painless and checks to see if you have a pulses in your legs/feet.  A dye or contrast material (a substance that highlights the blood vessels so they show up on x-ray) may be given to help your caregiver better see the arteries for the following tests. The dye is eliminated from your body by the kidney's. Your caregiver may order blood work to check your kidney function and other laboratory values before the following tests are performed:  Magnetic Resonance Angiography (MRA). An MRA is a picture study of the blood vessels and arteries. The MRA machine uses a large magnet to produce images of the blood vessels.  Computed Tomography Angiography (CTA). A CTA is a   specialized x-ray that looks at how the blood flows in your blood vessels. An IV may be inserted into your arm so contrast dye can be injected.  Angiogram. Is a procedure that uses x-rays to look at your blood vessels. This procedure is minimally invasive, meaning a small incision (cut) is made in your groin. A small tube (catheter) is then inserted into the artery of your groin. The catheter is  guided to the blood vessel or artery your caregiver wants to examine. Contrast dye is injected into the catheter. X-rays are then taken of the blood vessel or artery. After the images are obtained, the catheter is taken out. TREATMENT  Treatment of PVD involves many interventions which may include:  Lifestyle changes:  Quitting smoking.  Exercise.  Following a low fat, low cholesterol diet.  Control of diabetes.  Foot care is very important to the PVD patient. Good foot care can help prevent infection.  Medication:  Cholesterol-lowering medicine.  Blood pressure medicine.  Anti-platelet drugs.  Certain medicines may reduce symptoms of Intermittent Claudication.  Interventional/Surgical options:  Angioplasty. An Angioplasty is a procedure that inflates a balloon in the blocked artery. This opens the blocked artery to improve blood flow.  Stent Implant. A wire mesh tube (stent) is placed in the artery. The stent expands and stays in place, allowing the artery to remain open.  Peripheral Bypass Surgery. This is a surgical procedure that reroutes the blood around a blocked artery to help improve blood flow. This type of procedure may be performed if Angioplasty or stent implants are not an option. SEEK IMMEDIATE MEDICAL CARE IF:   You develop pain or numbness in your arms or legs.  Your arm or leg turns cold, becomes blue in color.  You develop redness, warmth, swelling and pain in your arms or legs. MAKE SURE YOU:   Understand these instructions.  Will watch your condition.  Will get help right away if you are not doing well or get worse. Document Released: 01/23/2005 Document Revised: 03/09/2012 Document Reviewed: 12/20/2008 Exit[employer] Patient Information 2014 [employer].   Smoking Cessation Quitting smoking is important to your health and has many advantages. However, it is not always easy to quit since nicotine is a very addictive drug. Often times, people try 3  times or more before being able to quit. This document explains the best ways for you to prepare to quit smoking. Quitting takes hard work and a lot of effort, but you can do it. ADVANTAGES OF QUITTING SMOKING  You will live longer, feel better, and live better.  Your body will feel the impact of quitting smoking almost immediately.  Within 20 minutes, blood pressure decreases. Your pulse returns to its normal level.  After 8 hours, carbon monoxide levels in the blood return to normal. Your oxygen level increases.  After 24 hours, the chance of having a heart attack starts to decrease. Your breath, hair, and body stop smelling like smoke.  After 48 hours, damaged nerve endings begin to recover. Your sense of taste and smell improve.  After 72 hours, the body is virtually free of nicotine. Your bronchial tubes relax and breathing becomes easier.  After 2 to 12 weeks, lungs can hold more air. Exercise becomes easier and circulation improves.  The risk of having a heart attack, stroke, cancer, or lung disease is greatly reduced.  After 1 year, the risk of coronary heart disease is cut in half.  After 5 years, the risk of stroke falls to   the same as a nonsmoker.  After 10 years, the risk of lung cancer is cut in half and the risk of other cancers decreases significantly.  After 15 years, the risk of coronary heart disease drops, usually to the level of a nonsmoker.  If you are pregnant, quitting smoking will improve your chances of having a healthy baby.  The people you live with, especially any children, will be healthier.  You will have extra money to spend on things other than cigarettes. QUESTIONS TO THINK ABOUT BEFORE ATTEMPTING TO QUIT You may want to talk about your answers with your caregiver.  Why do you want to quit?  If you tried to quit in the past, what helped and what did not?  What will be the most difficult situations for you after you quit? How will you plan to  handle them?  Who can help you through the tough times? Your family? Friends? A caregiver?  What pleasures do you get from smoking? What ways can you still get pleasure if you quit? Here are some questions to ask your caregiver:  How can you help me to be successful at quitting?  What medicine do you think would be best for me and how should I take it?  What should I do if I need more help?  What is smoking withdrawal like? How can I get information on withdrawal? GET READY  Set a quit date.  Change your environment by getting rid of all cigarettes, ashtrays, matches, and lighters in your home, car, or work. Do not let people smoke in your home.  Review your past attempts to quit. Think about what worked and what did not. GET SUPPORT AND ENCOURAGEMENT You have a better chance of being successful if you have help. You can get support in many ways.  Tell your family, friends, and co-workers that you are going to quit and need their support. Ask them not to smoke around you.  Get individual, group, or telephone counseling and support. Programs are available at local hospitals and health centers. Call your local health department for information about programs in your area.  Spiritual beliefs and practices may help some smokers quit.  Download a "quit meter" on your computer to keep track of quit statistics, such as how long you have gone without smoking, cigarettes not smoked, and money saved.  Get a self-help book about quitting smoking and staying off of tobacco. LEARN NEW SKILLS AND BEHAVIORS  Distract yourself from urges to smoke. Talk to someone, go for a walk, or occupy your time with a task.  Change your normal routine. Take a different route to work. Drink tea instead of coffee. Eat breakfast in a different place.  Reduce your stress. Take a hot bath, exercise, or read a book.  Plan something enjoyable to do every day. Reward yourself for not smoking.  Explore  interactive web-based programs that specialize in helping you quit. GET MEDICINE AND USE IT CORRECTLY Medicines can help you stop smoking and decrease the urge to smoke. Combining medicine with the above behavioral methods and support can greatly increase your chances of successfully quitting smoking.  Nicotine replacement therapy helps deliver nicotine to your body without the negative effects and risks of smoking. Nicotine replacement therapy includes nicotine gum, lozenges, inhalers, nasal sprays, and skin patches. Some may be available over-the-counter and others require a prescription.  Antidepressant medicine helps people abstain from smoking, but how this works is unknown. This medicine is available by prescription.    Nicotinic receptor partial agonist medicine simulates the effect of nicotine in your brain. This medicine is available by prescription. Ask your caregiver for advice about which medicines to use and how to use them based on your health history. Your caregiver will tell you what side effects to look out for if you choose to be on a medicine or therapy. Carefully read the information on the package. Do not use any other product containing nicotine while using a nicotine replacement product.  RELAPSE OR DIFFICULT SITUATIONS Most relapses occur within the first 3 months after quitting. Do not be discouraged if you start smoking again. Remember, most people try several times before finally quitting. You may have symptoms of withdrawal because your body is used to nicotine. You may crave cigarettes, be irritable, feel very hungry, cough often, get headaches, or have difficulty concentrating. The withdrawal symptoms are only temporary. They are strongest when you first quit, but they will go away within 10 14 days. To reduce the chances of relapse, try to:  Avoid drinking alcohol. Drinking lowers your chances of successfully quitting.  Reduce the amount of caffeine you consume. Once you  quit smoking, the amount of caffeine in your body increases and can give you symptoms, such as a rapid heartbeat, sweating, and anxiety.  Avoid smokers because they can make you want to smoke.  Do not let weight gain distract you. Many smokers will gain weight when they quit, usually less than 10 pounds. Eat a healthy diet and stay active. You can always lose the weight gained after you quit.  Find ways to improve your mood other than smoking. FOR MORE INFORMATION  www.smokefree.gov  Document Released: 12/10/2001 Document Revised: 06/16/2012 Document Reviewed: 03/26/2012 [employer] Patient Information 2014 [employer].  

## 2014-03-23 NOTE — Progress Notes (Signed)
[facility] HISTORY AND PHYSICAL -PAD  History of Present Illness Whitney Walker is a 58 y.o. female patient of Dr. Kellie Simmering returns for continued followup regarding her left femoral popliteal vein graft performed August 13, 2013. She had ischemia of the left fourth and fifth toes which has healed since her bypass.  She did have a pulmonary embolus November 24, 2013 and started [place]. She does not have claudication symptoms with walking, has pain in left anterior calf, 4/10 now, not aggravated by walking, denies non healing wounds, also feels numb. Her left toes have improved in terms of no more dark coloring. Seen in ED recently for pain in shoulders. She expresses generalized frustration.   Pt Diabetic: No Pt smoker: smoker  (is using hookah with nicotine in efforts to quit smoking, states she is also exposed to second hand smoke)  Pt meds include: Statin :No, she stopped on medical advice as headache may be caused by this ASA: No Other anticoagulants/antiplatelets: Jennye Moccasin since she developed PE.  Past Medical History  Diagnosis Date  . Anxiety   . Arthritis   . COPD (chronic obstructive pulmonary disease)   . Depression   . Hypertension   . Meningitis   . Vertigo   . Colon cancer   . Peripheral vascular disease   . CKD (chronic kidney disease)     Stage III 03/2013  . Shortness of breath   . CHF (congestive heart failure)   . PE (pulmonary embolism)     Social History History  Substance Use Topics  . Smoking status: Current Some Day Smoker -- 40 years    Types: Cigarettes    Last Attempt to Quit: 09/12/2013  . Smokeless tobacco: Never Used     Comment: pt states that she is using the e cigs  . Alcohol Use: Yes     Comment: 2 shots recently    Family History Family History  Problem Relation Age of Onset  . Cancer Mother     breast and bone  . Cancer Father     prostate    Past Surgical History  Procedure Laterality Date  .  Cesarean section      X 3 [id number]  . Cholecystectomy    . Abdominal hysterectomy    . Diverticulosis surgery-2002  2012  . Colectomy  2010  . Cholecystectomy  1980/s  . Femoral-popliteal bypass graft Left 08/13/2013    Procedure: BYPASS GRAFT FEMORAL-POPLITEAL ARTERY WITH NON-REVERSED SAPHANEOUS VEIN; ULTRASOUND GUIDED;  Surgeon: Mal [name], MD;  Location: [facility];  Service: Vascular;  Laterality: Left;    Allergies  Allergen Reactions  . Iohexol      Code: HIVES, Desc: HIVES W/ "DYE" USED FOR 1ST CT SCAN BUT NOT 2ND, NO PREMEDS USED, PT UNCERTAIN OF CIRCUMSTANCES,,?POSSIBLE MRI CONTRAST ALLERGY, ALL STUDIES DONE "SOMEWHERE" IN [place]//A.C., Onset Date: [id number]     Current Outpatient Prescriptions  Medication Sig Dispense Refill  . acetaminophen (TYLENOL) 325 MG tablet Take 650 mg by mouth every 6 (six) hours as needed for mild pain, moderate pain, fever or headache.      [name] amLODipine (NORVASC) 10 MG tablet Take 10 mg by mouth daily.      [name] atorvastatin (LIPITOR) 10 MG tablet Take 10 mg by mouth daily.      . carvedilol (COREG) 6.25 MG tablet Take 12.5 mg by mouth 2 (two) times daily with a meal.       . Rivaroxaban (XARELTO) 20 MG  TABS tablet Take 20 mg by mouth daily with supper.      . baclofen (LIORESAL) 10 MG tablet       . Cholecalciferol (VITAMIN D-3) 5000 UNITS TABS Take 5,000 Units by mouth daily.       No current facility-administered medications for this visit.    ROS: See HPI for pertinent positives and negatives.   Physical Examination  Filed Vitals:   03/23/14 1420  BP: 152/82  Pulse: 78  Resp: 16   Filed Weights   03/23/14 1420  Weight: 248 lb (112.492 kg)   Body mass index is 45.35 kg/(m^2).  General: A&O x 3, WDWN, morbidly obese female. Gait: normal Eyes: PERRLA. Pulmonary: CTAB, without wheezes , rales or rhonchi. Cardiac: regular Rythm , without detected murmur.         Carotid Bruits Left Right   Negative Negative  Aorta is not  palpable. Radial pulses: are 1 + palpable and =                           VASCULAR EXAM: Extremities without ischemic changes  without Gangrene; without open wounds.                                                                                                          LE Pulses LEFT RIGHT       POPLITEAL  not palpable   not palpable       POSTERIOR TIBIAL   palpable    palpable        DORSALIS PEDIS      ANTERIOR TIBIAL  palpable  palpable    Abdomen: soft, NT, no masses. Skin: no rashes, no ulcers noted. Musculoskeletal: no muscle wasting or atrophy.  Neurologic: A&O X 3; Appropriate Affect ; SENSATION: normal; MOTOR FUNCTION:  moving all extremities equally, motor strength 5/5 in arms, 4/5 in legs. Speech is fluent/normal. CN 2-12 intact.  Non-Invasive Vascular Imaging: DATE: 03/23/2014 LOWER EXTREMITY ARTERIAL DUPLEX EVALUATION    INDICATION: Peripheral Vascular Disease     PREVIOUS INTERVENTION(S): Left femoral-popliteal artery bypass graft placed 08/13/2013    DUPLEX EXAM:     RIGHT  LEFT   Peak Systolic Velocity (cm/s) Ratio (if abnormal) Waveform  Peak Systolic Velocity (cm/s) Ratio (if abnormal) Waveform     Inflow Artery 143  T     Proximal Anastomosis 115  T     Proximal Graft 89  T     Mid Graft 81  T      Distal Graft 147  T     Distal Anastomosis 141  T     Outflow Artery 97/151  T/T  1.11/0.87 Today's ABI / TBI 1.06/0.84  1.15/0.84 Previous ABI / TBI (12/14/2013 ) 1.13/0.81    Waveform:    M - Monophasic       B - Biphasic       T - Triphasic  If Ankle Brachial Index (ABI) or Toe Brachial Index (TBI) performed, please see complete report  ADDITIONAL FINDINGS:     IMPRESSION: Left arterial inflow is patent, no plaque visualized. Left femoral-popliteal artery bypass graft is patent, no hyperplasia or hemodynamically significant plaque is evident.    Compared to the previous exam:  Stable ankle and toe brachial indices since previous study on  12/14/2013.     ASSESSMENT: Whitney Walker is a 58 y.o. female who presents s/p left femoral-popliteal artery bypass graft placed 08/13/2013 with patent left arterial inflow, patent left femoral-popliteal artery bypass graft, and no evidence of arterial occlusive disease in both legs by ABI's. The numbness and mild/moderate pain and mild swelling that she is experiencing in her left lower leg is likely reperfusion pain and swelling which should improve over time with walking and elevation of both ankles above heart level when not walking.  PLAN:  She was counseled re smoking cessation. Walking as much as possible, elevating legs above heart when not walking, to reduce swelling in left lower leg. I discussed in depth with the patient the nature of atherosclerosis, and emphasized the importance of maximal medical management including strict control of blood pressure, blood glucose, and lipid levels, obtaining regular exercise, and cessation of smoking.  The patient is aware that without maximal medical management the underlying atherosclerotic disease process will progress, limiting the benefit of any interventions.  Based on the patient's vascular studies and examination, pt will return to clinic in 3 months for left LE arterial Duplex and ABI's.  The patient was given information about PAD including signs, symptoms, treatment, what symptoms should prompt the patient to seek immediate medical care, and risk reduction measures to take.  Clemon Chambers, RN, MSN, FNP-C [facility] Phone: [phone]  Clinic MD: Scot Dock  03/23/2014 2:33 PM

## 2014-05-13 DIAGNOSIS — Z86711 Personal history of pulmonary embolism: Secondary | ICD-10-CM | POA: Diagnosis not present

## 2014-05-13 DIAGNOSIS — Z86718 Personal history of other venous thrombosis and embolism: Secondary | ICD-10-CM | POA: Diagnosis not present

## 2014-05-13 DIAGNOSIS — R0989 Other specified symptoms and signs involving the circulatory and respiratory systems: Secondary | ICD-10-CM | POA: Diagnosis not present

## 2014-05-13 DIAGNOSIS — R0609 Other forms of dyspnea: Secondary | ICD-10-CM | POA: Diagnosis not present

## 2014-06-23 DIAGNOSIS — E1129 Type 2 diabetes mellitus with other diabetic kidney complication: Secondary | ICD-10-CM | POA: Diagnosis not present

## 2014-06-23 DIAGNOSIS — J449 Chronic obstructive pulmonary disease, unspecified: Secondary | ICD-10-CM | POA: Diagnosis not present

## 2014-06-23 DIAGNOSIS — N183 Chronic kidney disease, stage 3 unspecified: Secondary | ICD-10-CM | POA: Diagnosis not present

## 2014-06-23 DIAGNOSIS — M79609 Pain in unspecified limb: Secondary | ICD-10-CM | POA: Diagnosis not present

## 2014-06-23 DIAGNOSIS — E1165 Type 2 diabetes mellitus with hyperglycemia: Secondary | ICD-10-CM | POA: Diagnosis not present

## 2014-06-23 DIAGNOSIS — I1310 Hypertensive heart and chronic kidney disease without heart failure, with stage 1 through stage 4 chronic kidney disease, or unspecified chronic kidney disease: Secondary | ICD-10-CM | POA: Diagnosis not present

## 2014-06-23 DIAGNOSIS — I129 Hypertensive chronic kidney disease with stage 1 through stage 4 chronic kidney disease, or unspecified chronic kidney disease: Secondary | ICD-10-CM | POA: Diagnosis not present

## 2014-06-23 DIAGNOSIS — H538 Other visual disturbances: Secondary | ICD-10-CM | POA: Diagnosis not present

## 2014-07-05 ENCOUNTER — Other Ambulatory Visit (HOSPITAL_COMMUNITY): Payer: Medicare Other

## 2014-07-05 ENCOUNTER — Ambulatory Visit: Payer: Medicare Other | Admitting: Family

## 2014-07-05 ENCOUNTER — Encounter (HOSPITAL_COMMUNITY): Payer: Medicare Other

## 2014-07-11 ENCOUNTER — Other Ambulatory Visit: Payer: Self-pay | Admitting: *Deleted

## 2014-07-11 DIAGNOSIS — I739 Peripheral vascular disease, unspecified: Secondary | ICD-10-CM

## 2014-07-18 ENCOUNTER — Encounter: Payer: Self-pay | Admitting: Family

## 2014-07-19 ENCOUNTER — Ambulatory Visit (INDEPENDENT_AMBULATORY_CARE_PROVIDER_SITE_OTHER): Payer: Medicare Other | Admitting: Family

## 2014-07-19 ENCOUNTER — Encounter: Payer: Self-pay | Admitting: Family

## 2014-07-19 ENCOUNTER — Ambulatory Visit (HOSPITAL_COMMUNITY)
Admission: RE | Admit: 2014-07-19 | Discharge: 2014-07-19 | Disposition: A | Discharge status: Home / Self Care | Payer: Medicare Other | Source: Ambulatory Visit | Attending: Family | Admitting: Family

## 2014-07-19 ENCOUNTER — Ambulatory Visit (INDEPENDENT_AMBULATORY_CARE_PROVIDER_SITE_OTHER)
Admission: RE | Admit: 2014-07-19 | Discharge: 2014-07-19 | Disposition: A | Discharge status: Home / Self Care | Payer: Medicare Other | Source: Ambulatory Visit | Attending: Family | Admitting: Family

## 2014-07-19 VITALS — BP 145/81 | HR 78 | Resp 16 | Ht 63.0 in | Wt 242.0 lb

## 2014-07-19 DIAGNOSIS — I739 Peripheral vascular disease, unspecified: Secondary | ICD-10-CM | POA: Insufficient documentation

## 2014-07-19 DIAGNOSIS — Z48812 Encounter for surgical aftercare following surgery on the circulatory system: Secondary | ICD-10-CM | POA: Diagnosis not present

## 2014-07-19 NOTE — Progress Notes (Signed)
[facility] [place] HISTORY AND PHYSICAL -PAD  History of Present Illness Whitney Walker is a 58 y.o. female patient of Dr. Kellie Simmering returns for continued followup regarding her left femoral popliteal vein graft performed August 13, 2013. She had ischemia of the left fourth and fifth toes which has healed since her bypass.  She did have a pulmonary embolus November 24, 2013 and started [place].  She does not have claudication symptoms with walking, has pain in left anterior calf, 4/10 now, not aggravated by walking, denies non healing wounds, also feels numb in her left calf.   She reports that she elevates her legs as much as possible.  Pt denies any history of stroke.  Her walking is limited by dyspnea, does not seem to be limited by claudication; uses supplemental oxygen at home.    Pt Diabetic: No  Pt smoker: smoker (is using vapor electronic cigarette with no nicotine now in efforts to quit smoking, she has been nicotine free since about May, 2015,  states she is also exposed to second hand smoke)   Pt meds include:  Statin :No, she stopped on medical advice as headache may be caused by this  ASA: No  Other anticoagulants/antiplatelets: Jennye Moccasin since she developed PE, managed by Dr. Nadyne Coombes at this point, started while hospitalized.   Past Medical History  Diagnosis Date  . Anxiety   . Arthritis   . COPD (chronic obstructive pulmonary disease)   . Depression   . Hypertension   . Meningitis   . Vertigo   . Colon cancer   . Peripheral vascular disease   . CKD (chronic kidney disease)     Stage III 03/2013  . Shortness of breath   . CHF (congestive heart failure)   . PE (pulmonary embolism)     Social History History  Substance Use Topics  . Smoking status: Current Some Day Smoker -- 40 years    Types: Cigarettes    Last Attempt to Quit: 09/12/2013  . Smokeless tobacco: Never Used     Comment: pt states that she is using the e cigs  . Alcohol Use:  Yes     Comment: 2 shots recently    Family History Family History  Problem Relation Age of Onset  . Cancer Mother     breast and bone  . Cancer Father     prostate    Past Surgical History  Procedure Laterality Date  . Cesarean section      X 3 [id number]  . Cholecystectomy    . Abdominal hysterectomy    . Diverticulosis surgery-2002  2012  . Colectomy  2010  . Cholecystectomy  1980/s  . Femoral-popliteal bypass graft Left 08/13/2013    Procedure: BYPASS GRAFT FEMORAL-POPLITEAL ARTERY WITH NON-REVERSED SAPHANEOUS VEIN; ULTRASOUND GUIDED;  Surgeon: Mal [name], MD;  Location: [facility];  Service: Vascular;  Laterality: Left;    Allergies  Allergen Reactions  . Iohexol Other (See Comments)     Code: HIVES, Desc: HIVES W/ "DYE" USED FOR 1ST CT SCAN BUT NOT 2ND, NO PREMEDS USED, PT UNCERTAIN OF CIRCUMSTANCES,,?POSSIBLE MRI CONTRAST ALLERGY, ALL STUDIES DONE "SOMEWHERE" IN [place]//A.C., Onset Date: [id number]     Current Outpatient Prescriptions  Medication Sig Dispense Refill  . acetaminophen (TYLENOL) 325 MG tablet Take 650 mg by mouth every 6 (six) hours as needed for mild pain, moderate pain, fever or headache.      [name] amLODipine (NORVASC) 10 MG tablet Take 10 mg by  mouth daily.      . carvedilol (COREG) 6.25 MG tablet Take 12.5 mg by mouth 2 (two) times daily with a meal.       . oxyCODONE-acetaminophen (PERCOCET/ROXICET) 5-325 MG per tablet as needed.      . Rivaroxaban (XARELTO) 20 MG TABS tablet Take 20 mg by mouth daily with supper.      [name] atorvastatin (LIPITOR) 10 MG tablet Take 10 mg by mouth daily.      . baclofen (LIORESAL) 10 MG tablet       . Cholecalciferol (VITAMIN D-3) 5000 UNITS TABS Take 5,000 Units by mouth daily.       No current facility-administered medications for this visit.    ROS: See HPI for pertinent positives and negatives.   Physical Examination  Filed Vitals:   07/19/14 1528  BP: 145/81  Pulse: 78  Resp: 16   Filed Vitals:    07/19/14 1528  BP: 145/81  Pulse: 78  Resp: 16  Height: 5\' 3"  (1.6 m)  Weight: 242 lb (109.77 kg)  SpO2: 97%   Body mass index is 42.88 kg/(m^2).  General: A&O x 3, WDWN, morbidly obese female.  Gait: normal  Eyes: PERRLA.  Pulmonary: CTAB, without wheezes , rales or rhonchi.  Cardiac: regular Rythm , without detected murmur.   Carotid Bruits  Left  Right    Negative  Negative    Aorta is not palpable.  Radial pulses: are 1 + palpable and =   VASCULAR EXAM:  Extremities without ischemic changes  without Gangrene; without open wounds.   LE Pulses  LEFT  RIGHT   POPLITEAL  not palpable  not palpable   POSTERIOR TIBIAL  Not palpable  Not palpable   DORSALIS PEDIS  ANTERIOR TIBIAL  2+palpable  2+palpable   Abdomen: soft, NT, no masses.  Skin: no rashes, no ulcers noted.  Musculoskeletal: no muscle wasting or atrophy.  Neurologic: A&O X 3; Appropriate Affect ; SENSATION: normal; MOTOR FUNCTION: moving all extremities equally, motor strength 5/5 in arms, 4/5 in legs. Speech is fluent/normal. CN 2-12 intact.   Non-Invasive Vascular Imaging: DATE: 07/19/2014 LOWER EXTREMITY ARTERIAL DUPLEX EVALUATION    INDICATION: Evaluation of bypass graft.    PREVIOUS INTERVENTION(S): Left femoropopliteal bypass graft 08/13/2013    DUPLEX EXAM:     RIGHT  LEFT   Peak Systolic Velocity (cm/s) Ratio (if abnormal) Waveform  Peak Systolic Velocity (cm/s) Ratio (if abnormal) Waveform     Inflow Artery 145  T      Proximal Anastomosis 151  T     Proximal Graft 62  T     Mid Graft 79  T      Distal Graft 99  T     Distal Anastomosis 128  T     Outflow Artery 95  T  0.97 Today's ABI / TBI 1.02  1.11 Previous ABI / TBI (03/23/2014  ) 1.06    Waveform:    M - Monophasic       B - Biphasic       T - Triphasic  If Ankle Brachial Index (ABI) or Toe Brachial Index (TBI) performed, please see complete report     ADDITIONAL FINDINGS:     IMPRESSION: Widely patent left femoropopliteal bypass  graft without hyperplasia or plaque visualized.    Compared to the previous exam:  No significant change in comparison to the last exam on 03/23/2014.    ASSESSMENT: Whitney Walker is a 58 y.o. female  who is s/p left femoral popliteal vein graft performed August 13, 2013. She had ischemia of the left fourth and fifth toes which has healed since her bypass.  She did have a pulmonary embolus November 24, 2013 and started [place].  She does not have claudication symptoms with walking, has pain in left anterior calf, 4/10 now, not aggravated by walking, denies non healing wounds. Her left femoropopliteal bypass graft remains widely patent without hyperplasia or plaque visualized. No significant change in comparison to the last exam on 03/23/2014. ABI's in both legs remain normal.    PLAN:  Graduated walking program as allowed by her cardiologist.  I discussed in depth with the patient the nature of atherosclerosis, and emphasized the importance of maximal medical management including strict control of blood pressure, blood glucose, and lipid levels, obtaining regular exercise, and continued cessation of smoking.  The patient is aware that without maximal medical management the underlying atherosclerotic disease process will progress, limiting the benefit of any interventions.  Based on the patient's vascular studies and examination, pt will return to clinic in 6 months with left LE arterial Duplex and ABI's.   The patient was given information about PAD including signs, symptoms, treatment, what symptoms should prompt the patient to seek immediate medical care, and risk reduction measures to take.  Clemon Chambers, RN, MSN, FNP-C [facility] Phone: [phone]  Clinic MD: Oneida Alar  07/19/2014 3:51 PM

## 2014-07-19 NOTE — Patient Instructions (Signed)

## 2014-08-26 DIAGNOSIS — I129 Hypertensive chronic kidney disease with stage 1 through stage 4 chronic kidney disease, or unspecified chronic kidney disease: Secondary | ICD-10-CM | POA: Diagnosis not present

## 2014-08-26 DIAGNOSIS — E785 Hyperlipidemia, unspecified: Secondary | ICD-10-CM | POA: Diagnosis not present

## 2014-08-26 DIAGNOSIS — D631 Anemia in chronic kidney disease: Secondary | ICD-10-CM | POA: Diagnosis not present

## 2014-08-26 DIAGNOSIS — N183 Chronic kidney disease, stage 3 unspecified: Secondary | ICD-10-CM | POA: Diagnosis not present

## 2014-09-11 ENCOUNTER — Encounter (HOSPITAL_COMMUNITY): Payer: Self-pay | Admitting: Emergency Medicine

## 2014-09-11 ENCOUNTER — Emergency Department (HOSPITAL_COMMUNITY)
Admission: EM | Admit: 2014-09-11 | Discharge: 2014-09-11 | Disposition: A | Discharge status: Home / Self Care | Payer: Medicare Other | Attending: Emergency Medicine | Admitting: Emergency Medicine

## 2014-09-11 ENCOUNTER — Emergency Department (HOSPITAL_COMMUNITY): Payer: Medicare Other

## 2014-09-11 DIAGNOSIS — Z85038 Personal history of other malignant neoplasm of large intestine: Secondary | ICD-10-CM | POA: Insufficient documentation

## 2014-09-11 DIAGNOSIS — N183 Chronic kidney disease, stage 3 unspecified: Secondary | ICD-10-CM | POA: Insufficient documentation

## 2014-09-11 DIAGNOSIS — Z8661 Personal history of infections of the central nervous system: Secondary | ICD-10-CM | POA: Diagnosis not present

## 2014-09-11 DIAGNOSIS — I509 Heart failure, unspecified: Secondary | ICD-10-CM | POA: Insufficient documentation

## 2014-09-11 DIAGNOSIS — F411 Generalized anxiety disorder: Secondary | ICD-10-CM | POA: Insufficient documentation

## 2014-09-11 DIAGNOSIS — F17200 Nicotine dependence, unspecified, uncomplicated: Secondary | ICD-10-CM | POA: Diagnosis not present

## 2014-09-11 DIAGNOSIS — F3289 Other specified depressive episodes: Secondary | ICD-10-CM | POA: Insufficient documentation

## 2014-09-11 DIAGNOSIS — I129 Hypertensive chronic kidney disease with stage 1 through stage 4 chronic kidney disease, or unspecified chronic kidney disease: Secondary | ICD-10-CM | POA: Insufficient documentation

## 2014-09-11 DIAGNOSIS — M79605 Pain in left leg: Secondary | ICD-10-CM

## 2014-09-11 DIAGNOSIS — I10 Essential (primary) hypertension: Secondary | ICD-10-CM | POA: Insufficient documentation

## 2014-09-11 DIAGNOSIS — Z9889 Other specified postprocedural states: Secondary | ICD-10-CM | POA: Insufficient documentation

## 2014-09-11 DIAGNOSIS — S99919A Unspecified injury of unspecified ankle, initial encounter: Secondary | ICD-10-CM | POA: Diagnosis not present

## 2014-09-11 DIAGNOSIS — S99929A Unspecified injury of unspecified foot, initial encounter: Principal | ICD-10-CM

## 2014-09-11 DIAGNOSIS — J4489 Other specified chronic obstructive pulmonary disease: Secondary | ICD-10-CM | POA: Diagnosis not present

## 2014-09-11 DIAGNOSIS — M79609 Pain in unspecified limb: Secondary | ICD-10-CM | POA: Diagnosis not present

## 2014-09-11 DIAGNOSIS — I739 Peripheral vascular disease, unspecified: Secondary | ICD-10-CM | POA: Insufficient documentation

## 2014-09-11 DIAGNOSIS — M25519 Pain in unspecified shoulder: Secondary | ICD-10-CM | POA: Diagnosis not present

## 2014-09-11 DIAGNOSIS — S8990XA Unspecified injury of unspecified lower leg, initial encounter: Secondary | ICD-10-CM | POA: Diagnosis not present

## 2014-09-11 DIAGNOSIS — F329 Major depressive disorder, single episode, unspecified: Secondary | ICD-10-CM | POA: Insufficient documentation

## 2014-09-11 DIAGNOSIS — M129 Arthropathy, unspecified: Secondary | ICD-10-CM | POA: Insufficient documentation

## 2014-09-11 DIAGNOSIS — Y92410 Unspecified street and highway as the place of occurrence of the external cause: Secondary | ICD-10-CM | POA: Insufficient documentation

## 2014-09-11 DIAGNOSIS — S298XXA Other specified injuries of thorax, initial encounter: Secondary | ICD-10-CM | POA: Diagnosis not present

## 2014-09-11 DIAGNOSIS — Z86711 Personal history of pulmonary embolism: Secondary | ICD-10-CM | POA: Diagnosis not present

## 2014-09-11 DIAGNOSIS — M545 Low back pain, unspecified: Secondary | ICD-10-CM | POA: Diagnosis not present

## 2014-09-11 DIAGNOSIS — Y9389 Activity, other specified: Secondary | ICD-10-CM | POA: Diagnosis not present

## 2014-09-11 DIAGNOSIS — M898X1 Other specified disorders of bone, shoulder: Secondary | ICD-10-CM

## 2014-09-11 DIAGNOSIS — S46909A Unspecified injury of unspecified muscle, fascia and tendon at shoulder and upper arm level, unspecified arm, initial encounter: Secondary | ICD-10-CM | POA: Diagnosis not present

## 2014-09-11 DIAGNOSIS — IMO0002 Reserved for concepts with insufficient information to code with codable children: Secondary | ICD-10-CM | POA: Insufficient documentation

## 2014-09-11 DIAGNOSIS — M25569 Pain in unspecified knee: Secondary | ICD-10-CM | POA: Diagnosis not present

## 2014-09-11 DIAGNOSIS — Z79899 Other long term (current) drug therapy: Secondary | ICD-10-CM | POA: Insufficient documentation

## 2014-09-11 DIAGNOSIS — R079 Chest pain, unspecified: Secondary | ICD-10-CM | POA: Diagnosis not present

## 2014-09-11 DIAGNOSIS — Z792 Long term (current) use of antibiotics: Secondary | ICD-10-CM | POA: Diagnosis not present

## 2014-09-11 DIAGNOSIS — M546 Pain in thoracic spine: Secondary | ICD-10-CM | POA: Diagnosis not present

## 2014-09-11 DIAGNOSIS — J449 Chronic obstructive pulmonary disease, unspecified: Secondary | ICD-10-CM | POA: Diagnosis not present

## 2014-09-11 DIAGNOSIS — S4980XA Other specified injuries of shoulder and upper arm, unspecified arm, initial encounter: Secondary | ICD-10-CM | POA: Diagnosis not present
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
## 2014-09-11 MED ORDER — ONDANSETRON 4 MG PO TBDP
4.0000 mg | ORAL_TABLET | Freq: Once | ORAL | Status: AC
  Administered 2014-09-11: 4 mg via ORAL
  Filled 2014-09-11: qty 1

## 2014-09-11 MED ORDER — ACETAMINOPHEN 500 MG PO TABS
1000.0000 mg | ORAL_TABLET | Freq: Once | ORAL | Status: AC
  Administered 2014-09-11: 1000 mg via ORAL
  Filled 2014-09-11: qty 2
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
## 2014-09-11 NOTE — Discharge Instructions (Signed)
Read the information below.  You may return to the Emergency Department at any time for worsening condition or any new symptoms that concern you.  If you develop worsening pain, shortness of breath, fever, you pass out, or become weak or dizzy, return to the ER for a recheck.

## 2014-09-11 NOTE — ED Provider Notes (Signed)
Medical screening examination/treatment/procedure(s) were performed by non-physician practitioner and as supervising physician I was immediately available for consultation/collaboration.   EKG Interpretation None        Evelina Bucy, MD 09/11/14 2316

## 2014-09-12 ENCOUNTER — Encounter (HOSPITAL_COMMUNITY): Payer: Self-pay | Admitting: Emergency Medicine

## 2014-09-12 ENCOUNTER — Emergency Department (HOSPITAL_COMMUNITY)
Admission: EM | Admit: 2014-09-12 | Discharge: 2014-09-12 | Disposition: A | Discharge status: Home / Self Care | Payer: No Typology Code available for payment source | Attending: Emergency Medicine | Admitting: Emergency Medicine

## 2014-09-12 DIAGNOSIS — Z79899 Other long term (current) drug therapy: Secondary | ICD-10-CM | POA: Diagnosis not present

## 2014-09-12 DIAGNOSIS — M62838 Other muscle spasm: Secondary | ICD-10-CM | POA: Diagnosis not present

## 2014-09-12 DIAGNOSIS — M5380 Other specified dorsopathies, site unspecified: Secondary | ICD-10-CM | POA: Diagnosis not present

## 2014-09-12 DIAGNOSIS — Z86711 Personal history of pulmonary embolism: Secondary | ICD-10-CM | POA: Diagnosis not present

## 2014-09-12 DIAGNOSIS — J449 Chronic obstructive pulmonary disease, unspecified: Secondary | ICD-10-CM | POA: Diagnosis not present

## 2014-09-12 DIAGNOSIS — F17200 Nicotine dependence, unspecified, uncomplicated: Secondary | ICD-10-CM | POA: Insufficient documentation

## 2014-09-12 DIAGNOSIS — I509 Heart failure, unspecified: Secondary | ICD-10-CM | POA: Diagnosis not present

## 2014-09-12 DIAGNOSIS — Y9389 Activity, other specified: Secondary | ICD-10-CM | POA: Diagnosis not present

## 2014-09-12 DIAGNOSIS — I129 Hypertensive chronic kidney disease with stage 1 through stage 4 chronic kidney disease, or unspecified chronic kidney disease: Secondary | ICD-10-CM | POA: Diagnosis not present

## 2014-09-12 DIAGNOSIS — N183 Chronic kidney disease, stage 3 unspecified: Secondary | ICD-10-CM | POA: Insufficient documentation

## 2014-09-12 DIAGNOSIS — J4489 Other specified chronic obstructive pulmonary disease: Secondary | ICD-10-CM | POA: Insufficient documentation

## 2014-09-12 DIAGNOSIS — IMO0002 Reserved for concepts with insufficient information to code with codable children: Secondary | ICD-10-CM | POA: Insufficient documentation

## 2014-09-12 DIAGNOSIS — Z8659 Personal history of other mental and behavioral disorders: Secondary | ICD-10-CM | POA: Insufficient documentation

## 2014-09-12 DIAGNOSIS — Z8669 Personal history of other diseases of the nervous system and sense organs: Secondary | ICD-10-CM | POA: Insufficient documentation

## 2014-09-12 DIAGNOSIS — I10 Essential (primary) hypertension: Secondary | ICD-10-CM | POA: Diagnosis not present

## 2014-09-12 DIAGNOSIS — Z7901 Long term (current) use of anticoagulants: Secondary | ICD-10-CM | POA: Insufficient documentation

## 2014-09-12 DIAGNOSIS — M129 Arthropathy, unspecified: Secondary | ICD-10-CM | POA: Diagnosis not present

## 2014-09-12 DIAGNOSIS — Y92410 Unspecified street and highway as the place of occurrence of the external cause: Secondary | ICD-10-CM | POA: Diagnosis not present

## 2014-09-12 DIAGNOSIS — S4980XA Other specified injuries of shoulder and upper arm, unspecified arm, initial encounter: Secondary | ICD-10-CM | POA: Insufficient documentation

## 2014-09-12 DIAGNOSIS — Z85038 Personal history of other malignant neoplasm of large intestine: Secondary | ICD-10-CM | POA: Insufficient documentation

## 2014-09-12 DIAGNOSIS — S46909A Unspecified injury of unspecified muscle, fascia and tendon at shoulder and upper arm level, unspecified arm, initial encounter: Secondary | ICD-10-CM | POA: Insufficient documentation

## 2014-09-12 MED ORDER — OXYCODONE-ACETAMINOPHEN 5-325 MG PO TABS: 1.0000 | ORAL_TABLET | Freq: Four times a day (QID) | ORAL | Status: DC | PRN

## 2014-09-12 MED ORDER — DIAZEPAM 5 MG PO TABS: 5.0000 mg | ORAL_TABLET | Freq: Two times a day (BID) | ORAL | Status: DC

## 2014-09-12 NOTE — ED Notes (Signed)
Declined W/C at D/C and was escorted to lobby by RN. 
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
## 2014-09-12 NOTE — Discharge Instructions (Signed)
Take percocet for severe pain only. No driving or operating heavy machinery while taking percocet. This medication may cause drowsiness. Take Valium as needed as directed for muscle spasm. No driving or operating heavy machinery while taking valium. This medication may cause drowsiness.  Spasticity Spasticity is a condition in which certain muscles contract continuously. This causes stiffness or tightness of the muscles. It may interfere with movement, speech, and manner of walking. CAUSES  This condition is usually caused by damage to the portion of the brain or spinal cord that controls voluntary movement. It may occur in association with:  Spinal cord injury.  Multiple sclerosis.  Cerebral palsy.  Brain damage due to lack of oxygen.  Brain trauma.  Severe head injury.  Metabolic diseases such as:  Adrenoleukodystrophy.  ALS [place] Cerise Gehrig's disease).  Phenylketonuria. SYMPTOMS   Increased muscle tone (hypertonicity).  A series of rapid muscle contractions (clonus).  Exaggerated deep tendon reflexes.  Muscle spasms.  Involuntary crossing of the legs (scissoring).  Fixed joints. The degree of spasticity varies. It ranges from mild muscle stiffness to severe, painful, and uncontrollable muscle spasms. It can interfere with rehabilitation in patients with certain disorders. It often interferes with daily activities. TREATMENT  Treatment may include:  Medications.  Physical therapy regimens. They may include muscle stretching and range of motion exercises. These help prevent shrinkage or shortening of muscles. They also help reduce the severity of symptoms.  Surgery. This may be recommended for tendon release or to sever the nerve-muscle pathway. PROGNOSIS  The outcome for those with spasticity depends on:  Severity of the spasticity.  Associated disorder(s). Document Released: 12/06/2002 Document Revised: 03/09/2012 Document Reviewed: 03/01/2014 [facility] Patient  Information 2015 [place], [place]. This information is not intended to replace advice given to you by your health care provider. Make sure you discuss any questions you have with your health care provider.  Muscle Cramps and Spasms Muscle cramps and spasms occur when a muscle or muscles tighten and you have no control over this tightening (involuntary muscle contraction). They are a common problem and can develop in any muscle. The most common place is in the calf muscles of the leg. Both muscle cramps and muscle spasms are involuntary muscle contractions, but they also have differences:   Muscle cramps are sporadic and painful. They may last a few seconds to a quarter of an hour. Muscle cramps are often more forceful and last longer than muscle spasms.  Muscle spasms may or may not be painful. They may also last just a few seconds or much longer. CAUSES  It is uncommon for cramps or spasms to be due to a serious underlying problem. In many cases, the cause of cramps or spasms is unknown. Some common causes are:   Overexertion.   Overuse from repetitive motions (doing the same thing over and over).   Remaining in a certain position for a long period of time.   Improper preparation, form, or technique while performing a sport or activity.   Dehydration.   Injury.   Side effects of some medicines.   Abnormally low levels of the salts and ions in your blood (electrolytes), especially potassium and calcium. This could happen if you are taking water pills (diuretics) or you are pregnant.  Some underlying medical problems can make it more likely to develop cramps or spasms. These include, but are not limited to:   Diabetes.   Parkinson disease.   Hormone disorders, such as thyroid problems.   Alcohol abuse.  Diseases specific to muscles, joints, and bones.   Blood vessel disease where not enough blood is getting to the muscles.  HOME CARE INSTRUCTIONS   Stay well  hydrated. Drink enough water and fluids to keep your urine clear or pale yellow.  It may be helpful to massage, stretch, and relax the affected muscle.  For tight or tense muscles, use a warm towel, heating pad, or hot shower water directed to the affected area.  If you are sore or have pain after a cramp or spasm, applying ice to the affected area may relieve discomfort.  Put ice in a plastic bag.  Place a towel between your skin and the bag.  Leave the ice on for 15-20 minutes, 03-04 times a day.  Medicines used to treat a known cause of cramps or spasms may help reduce their frequency or severity. Only take over-the-counter or prescription medicines as directed by your caregiver. SEEK MEDICAL CARE IF:  Your cramps or spasms get more severe, more frequent, or do not improve over time.  MAKE SURE YOU:   Understand these instructions.  Will watch your condition.  Will get help right away if you are not doing well or get worse. Document Released: 06/07/2002 Document Revised: 04/12/2013 Document Reviewed: 12/02/2012 [facility] Patient Information 2015 [place], [place]. This information is not intended to replace advice given to you by your health care provider. Make sure you discuss any questions you have with your health care provider.

## 2014-09-14 NOTE — ED Provider Notes (Signed)
Medical screening examination/treatment/procedure(s) were performed by non-physician practitioner and as supervising physician I was immediately available for consultation/collaboration.  Richarda Blade, MD 09/14/14 [phone]

## 2014-09-22 DIAGNOSIS — R7309 Other abnormal glucose: Secondary | ICD-10-CM | POA: Diagnosis not present

## 2014-09-22 DIAGNOSIS — N183 Chronic kidney disease, stage 3 unspecified: Secondary | ICD-10-CM | POA: Diagnosis not present

## 2014-09-22 DIAGNOSIS — M79609 Pain in unspecified limb: Secondary | ICD-10-CM | POA: Diagnosis not present

## 2014-09-22 DIAGNOSIS — I129 Hypertensive chronic kidney disease with stage 1 through stage 4 chronic kidney disease, or unspecified chronic kidney disease: Secondary | ICD-10-CM | POA: Diagnosis not present

## 2014-09-22 DIAGNOSIS — M109 Gout, unspecified: Secondary | ICD-10-CM | POA: Diagnosis not present

## 2014-09-22 DIAGNOSIS — R609 Edema, unspecified: Secondary | ICD-10-CM | POA: Diagnosis not present

## 2014-09-22 DIAGNOSIS — M542 Cervicalgia: Secondary | ICD-10-CM | POA: Diagnosis not present

## 2014-09-22 DIAGNOSIS — Y92410 Unspecified street and highway as the place of occurrence of the external cause: Secondary | ICD-10-CM | POA: Diagnosis not present

## 2014-09-22 DIAGNOSIS — I1310 Hypertensive heart and chronic kidney disease without heart failure, with stage 1 through stage 4 chronic kidney disease, or unspecified chronic kidney disease: Secondary | ICD-10-CM | POA: Diagnosis not present

## 2014-11-30 DIAGNOSIS — I129 Hypertensive chronic kidney disease with stage 1 through stage 4 chronic kidney disease, or unspecified chronic kidney disease: Secondary | ICD-10-CM | POA: Diagnosis not present

## 2014-11-30 DIAGNOSIS — N183 Chronic kidney disease, stage 3 (moderate): Secondary | ICD-10-CM | POA: Diagnosis not present

## 2014-11-30 DIAGNOSIS — F339 Major depressive disorder, recurrent, unspecified: Secondary | ICD-10-CM | POA: Diagnosis not present

## 2014-11-30 DIAGNOSIS — R7309 Other abnormal glucose: Secondary | ICD-10-CM | POA: Diagnosis not present

## 2014-11-30 DIAGNOSIS — Z0000 Encounter for general adult medical examination without abnormal findings: Secondary | ICD-10-CM | POA: Diagnosis not present

## 2014-12-05 DIAGNOSIS — R0989 Other specified symptoms and signs involving the circulatory and respiratory systems: Secondary | ICD-10-CM | POA: Diagnosis not present

## 2014-12-05 DIAGNOSIS — Z86718 Personal history of other venous thrombosis and embolism: Secondary | ICD-10-CM | POA: Diagnosis not present

## 2014-12-05 DIAGNOSIS — N183 Chronic kidney disease, stage 3 (moderate): Secondary | ICD-10-CM | POA: Diagnosis not present

## 2014-12-05 DIAGNOSIS — R0609 Other forms of dyspnea: Secondary | ICD-10-CM | POA: Diagnosis not present

## 2014-12-08 ENCOUNTER — Encounter (HOSPITAL_COMMUNITY): Payer: Self-pay | Admitting: Vascular Surgery

## 2014-12-16 ENCOUNTER — Encounter: Payer: Self-pay | Admitting: Hematology & Oncology

## 2015-01-11 ENCOUNTER — Encounter: Payer: Self-pay | Admitting: Hematology

## 2015-01-11 ENCOUNTER — Telehealth: Payer: Self-pay | Admitting: Hematology

## 2015-01-11 ENCOUNTER — Ambulatory Visit: Payer: Medicare Other

## 2015-01-11 ENCOUNTER — Ambulatory Visit (HOSPITAL_BASED_OUTPATIENT_CLINIC_OR_DEPARTMENT_OTHER): Payer: Medicare Other | Admitting: Hematology

## 2015-01-11 ENCOUNTER — Other Ambulatory Visit (HOSPITAL_BASED_OUTPATIENT_CLINIC_OR_DEPARTMENT_OTHER): Payer: Medicare Other

## 2015-01-11 ENCOUNTER — Other Ambulatory Visit: Payer: Medicare Other

## 2015-01-11 VITALS — BP 148/58 | HR 65 | Temp 97.5°F | Resp 20 | Ht 63.0 in | Wt 241.2 lb

## 2015-01-11 DIAGNOSIS — Z86711 Personal history of pulmonary embolism: Secondary | ICD-10-CM

## 2015-01-11 DIAGNOSIS — D649 Anemia, unspecified: Secondary | ICD-10-CM

## 2015-01-11 DIAGNOSIS — D72829 Elevated white blood cell count, unspecified: Secondary | ICD-10-CM

## 2015-01-11 DIAGNOSIS — Z85038 Personal history of other malignant neoplasm of large intestine: Secondary | ICD-10-CM

## 2015-01-11 LAB — CBC & DIFF AND RETIC
BASO%: 0.5 % (ref 0.0–2.0)
Basophils Absolute: 0.1 10*3/uL (ref 0.0–0.1)
EOS%: 1.4 % (ref 0.0–7.0)
Eosinophils Absolute: 0.2 10*3/uL (ref 0.0–0.5)
HCT: 38.6 % (ref 34.8–46.6)
HGB: 12.1 g/dL (ref 11.6–15.9)
Immature Retic Fract: 6.3 % (ref 1.60–10.00)
LYMPH%: 30.5 % (ref 14.0–49.7)
MCH: 26.9 pg (ref 25.1–34.0)
MCHC: 31.3 g/dL — ABNORMAL LOW (ref 31.5–36.0)
MCV: 85.8 fL (ref 79.5–101.0)
MONO#: 0.8 10*3/uL (ref 0.1–0.9)
MONO%: 6.3 % (ref 0.0–14.0)
NEUT#: 7.4 10*3/uL — ABNORMAL HIGH (ref 1.5–6.5)
NEUT%: 61.3 % (ref 38.4–76.8)
Platelets: 226 10*3/uL (ref 145–400)
RBC: 4.5 10*6/uL (ref 3.70–5.45)
RDW: 13.8 % (ref 11.2–14.5)
Retic %: 1.72 % (ref 0.70–2.10)
Retic Ct Abs: 77.4 10*3/uL (ref 33.70–90.70)
WBC: 12 10*3/uL — ABNORMAL HIGH (ref 3.9–10.3)
lymph#: 3.7 10*3/uL — ABNORMAL HIGH (ref 0.9–3.3)

## 2015-01-11 LAB — TECHNOLOGIST REVIEW

## 2015-01-11 LAB — COMPREHENSIVE METABOLIC PANEL (CC13)
ALT: 37 U/L (ref 0–55)
AST: 37 U/L — ABNORMAL HIGH (ref 5–34)
Albumin: 2.8 g/dL — ABNORMAL LOW (ref 3.5–5.0)
Alkaline Phosphatase: 123 U/L (ref 40–150)
Anion Gap: 9 mEq/L (ref 3–11)
BUN: 24.5 mg/dL (ref 7.0–26.0)
CO2: 21 mEq/L — ABNORMAL LOW (ref 22–29)
Calcium: 9.2 mg/dL (ref 8.4–10.4)
Chloride: 109 mEq/L (ref 98–109)
Creatinine: 1.9 mg/dL — ABNORMAL HIGH (ref 0.6–1.1)
EGFR: 29 mL/min/{1.73_m2} — ABNORMAL LOW (ref >90)
Glucose: 92 mg/dl (ref 70–140)
Potassium: 4.4 mEq/L (ref 3.5–5.1)
Sodium: 139 mEq/L (ref 136–145)
Total Bilirubin: 0.36 mg/dL (ref 0.20–1.20)
Total Protein: 7.9 g/dL (ref 6.4–8.3)

## 2015-01-11 NOTE — Telephone Encounter (Signed)
gv adn printed appt appt sched and avs for pt for April....sent pt to lab

## 2015-01-11 NOTE — Progress Notes (Signed)
[facility]  [phone] Fax:([phone]) [place] consult Note   Patient Care Team: Glendale Chard, MD as PCP - General (Internal Medicine) Rickard Patience, MD as Referring Physician (Family Medicine) 01/11/2015  CHIEF COMPLAINTS/PURPOSE OF CONSULTATION:  Mild leukocytosis   HISTORY OF PRESENTING ILLNESS:  Whitney Walker 59 y.o. female is referred here by her PCP Dr. Baird Cancer for mild leukocytosis.   She was previously seen by Dr. Jamse Arn in 2013 for leukocytosis. Her WBC has been around 11-18K since 08/2009, with predominantly neutrophils. BCR/ABL and check to mutation were checked in 2013 which were all negative. She also had a mild anemia in the past with hemoglobin in the 11-12 range, her platelet count has been normal.  She had PE in 10/2013 and was treated with Xaralto until 11/2014, she is now on aspirin. She has PVD and had left femoral popliteal vein bypass surgery in 07/2013. She also has COPD, she did use oxygen after she had PE, but not any more since Nov 2015 due to financial reasons. She has mild to moderate dyspenia on exertion.   She had tried quitting smoking a few times and finally stopped about 5 weeks ago.   She has had sinus infection on monthly bases, and recently caught a cold from her grand-grandson. She has headache for the past 4-5 month. She is being referred to see a neurologist.   She had early stage colon cancer in 2009, had surgical resection and no adjuvant chemo was needed. She had last colonoscopy in 05/2012  which was unremarkable. Her last mammogram was 2 years ago and she is planning to have a repeated one soon.    MEDICAL HISTORY:  Past Medical History  Diagnosis Date  . Anxiety   . Arthritis   . COPD (chronic obstructive pulmonary disease)   . Depression   . Hypertension   . Meningitis   . Vertigo   . Colon cancer   . Peripheral vascular disease   . CKD (chronic kidney disease)     Stage III 03/2013  .  Shortness of breath   . CHF (congestive heart failure)   . PE (pulmonary embolism)     SURGICAL HISTORY: Past Surgical History  Procedure Laterality Date  . Cesarean section      X 3 [id number]  . Cholecystectomy    . Abdominal hysterectomy    . Diverticulosis surgery-2002  2012  . Colectomy  2010  . Cholecystectomy  1980/s  . Femoral-popliteal bypass graft Left 08/13/2013    Procedure: BYPASS GRAFT FEMORAL-POPLITEAL ARTERY WITH NON-REVERSED SAPHANEOUS VEIN; ULTRASOUND GUIDED;  Surgeon: Mal [name], MD;  Location: [facility];  Service: Vascular;  Laterality: Left;  . Abdominal aortagram N/A 07/26/2013    Procedure: ABDOMINAL Maxcine Ham;  Surgeon: [name] Staples, MD;  Location: [facility] CATH LAB;  Service: Cardiovascular;  Laterality: N/A;  . Lower extremity angiogram Left 07/26/2013    Procedure: LOWER EXTREMITY ANGIOGRAM;  Surgeon: [name] St. Joseph, MD;  Location: [facility] CATH LAB;  Service: Cardiovascular;  Laterality: Left;    SOCIAL HISTORY: History   Social History  . Marital Status: Single    Spouse Name: N/A    Number of Children: N/A  . Years of Education: N/A   Occupational History  . Not on file.   Social History Main Topics  . Smoking status: Current Some Day Smoker -- 40 years    Types: Cigarettes    Last Attempt to Quit: 09/12/2013  . Smokeless tobacco:  Never Used     Comment: pt states that she is using the e cigs  . Alcohol Use: Yes     Comment: 2 shots recently  . Drug Use: No  . Sexual Activity: Not on file   Other Topics Concern  . Not on file   Social History Narrative    FAMILY HISTORY: Family History  Problem Relation Age of Onset  . Cancer Mother     breast and bone  . Cancer Father     prostate    ALLERGIES:  is allergic to iohexol.  MEDICATIONS:  Current Outpatient Prescriptions  Medication Sig Dispense Refill  . acetaminophen (TYLENOL) 325 MG tablet Take 650 mg by mouth every 6 (six) hours as needed for mild pain, moderate pain, fever or headache.      [name] amLODipine (NORVASC) 10 MG tablet Take 10 mg by mouth daily.    [name] aspirin 325 MG tablet Take 325 mg by mouth daily.    . carvedilol (COREG) 6.25 MG tablet Take 12.5 mg by mouth 2 (two) times daily with a meal.      No current facility-administered medications for this visit.    REVIEW OF SYSTEMS:   Constitutional: Denies fevers, chills or abnormal night sweats Eyes: Denies blurriness of vision, double vision or watery eyes Ears, nose, mouth, throat, and face: Denies mucositis or sore throat Respiratory: Denies cough, dyspnea or wheezes Cardiovascular: Denies palpitation, chest discomfort or lower extremity swelling Gastrointestinal:  Denies nausea, heartburn or change in bowel habits Skin: Denies abnormal skin rashes Lymphatics: Denies new lymphadenopathy or easy bruising Neurological:Denies numbness, tingling or new weaknesses Behavioral/Psych: Mood is stable, no new changes  All other systems were reviewed with the patient and are negative.  PHYSICAL EXAMINATION: ECOG PERFORMANCE STATUS: 0 - Asymptomatic  Filed Vitals:   01/11/15 1354  BP: 148/58  Pulse: 65  Temp: 97.5 F (36.4 C)  Resp: 20   Filed Weights   01/11/15 1354  Weight: 241 lb 3.2 oz (109.408 kg)    GENERAL:alert, no distress and comfortable SKIN: skin color, texture, turgor are normal, no rashes or significant lesions EYES: normal, conjunctiva are pink and non-injected, sclera clear OROPHARYNX:no exudate, no erythema and lips, buccal mucosa, and tongue normal  NECK: supple, thyroid normal size, non-tender, without nodularity LYMPH:  no palpable lymphadenopathy in the cervical, axillary or inguinal LUNGS: clear to auscultation and percussion with normal breathing effort HEART: regular rate & rhythm and no murmurs and no lower extremity edema ABDOMEN:abdomen soft, non-tender and normal bowel sounds Musculoskeletal:no cyanosis of digits and no clubbing  PSYCH: alert & oriented x 3 with fluent  speech NEURO: no focal motor/sensory deficits  LABORATORY DATA:  I have reviewed the data as listed CBC Latest Ref Rng 03/09/2014 11/15/2013 11/14/2013  WBC 4.0 - 10.5 K/uL 13.8(H) 15.4(H) 16.4(H)  Hemoglobin 12.0 - 15.0 g/dL 12.4 11.1(L) 11.9(L)  Hematocrit 36.0 - 46.0 % 38.8 34.2(L) 36.7  Platelets 150 - 400 K/uL 273 186 206    CMP Latest Ref Rng 03/09/2014 11/14/2013 11/14/2013  Glucose 70 - 99 mg/dL 143(H) 115(H) 121(H)  BUN 6 - 23 mg/dL 28(H) 26(H) 26(H)  Creatinine 0.50 - 1.10 mg/dL 1.71(H) 1.65(H) 1.70(H)  Sodium 137 - 147 mEq/L 141 137 139  Potassium 3.7 - 5.3 mEq/L 4.8 4.2 4.2  Chloride 96 - 112 mEq/L 107 104 106  CO2 19 - 32 mEq/L 23 24 23   Calcium 8.4 - 10.5 mg/dL 9.9 9.6 9.7  Total Protein 6.0 - 8.3  g/dL - 7.7 -  Total Bilirubin 0.3 - 1.2 mg/dL - 0.2(L) -  Alkaline Phos 39 - 117 U/L - 102 -  AST 0 - 37 U/L - 22 -  ALT 0 - 35 U/L - 24 -     RADIOGRAPHIC STUDIES: I have personally reviewed the radiological images as listed and agreed with the findings in the report. No results found.  ASSESSMENT & PLAN:  59 year old female with chronic mild leukocytosis and anemia.  1. Mild cytosis, with predominant neutrophils -His total white count today is 12 K, with neutrophil 7.4K, and the rest of the white cell within normal limits. Her mild leukocytosis has been stable over the past to 3 years, previous workup was negative for BCR/ABL or JAK 2 mutation. This is likely reactive, secondary to her smoking and chronic inflammations (allergy and sinus Infection). No evidence of myeloproliferative neoplasm.   2. History of mild anemia, resolved  -She had hemoglobin 11-12 in the past a few years, her hemoglobin today was 12.4, hematocrit 38.8. MCV normal. -. His iron study and SPEP were normal. -No further workup at this point.  3. History of colon cancer in 2009  -She has computed 5 years of surveillance.   4.  History of PE -Off anticoagulation now. Observation.  Follow-up:  3 months with CBC and differential. If her leukocytosis stable or resolved, she can be followed up by her primary care physician afterwards.  All questions were answered. The patient knows to call the clinic with any problems, questions or concerns. I spent 30 minutes counseling the patient face to face. The total time spent in the appointment was 40 minutes and more than 50% was on counseling.     Truitt Merle, MD 01/11/2015

## 2015-01-12 LAB — IRON AND TIBC CHCC
%SAT: 26 % (ref 21–57)
Iron: 70 ug/dL (ref 41–142)
TIBC: 269 ug/dL (ref 236–444)
UIBC: 199 ug/dL (ref 120–384)

## 2015-01-12 LAB — FERRITIN CHCC: Ferritin: 218 ng/ml (ref 9–269)

## 2015-01-15 ENCOUNTER — Encounter: Payer: Self-pay | Admitting: Hematology

## 2015-01-23 ENCOUNTER — Encounter: Payer: Self-pay | Admitting: Vascular Surgery

## 2015-01-23 ENCOUNTER — Encounter: Payer: Self-pay | Admitting: Family

## 2015-01-24 ENCOUNTER — Ambulatory Visit (INDEPENDENT_AMBULATORY_CARE_PROVIDER_SITE_OTHER)
Admission: RE | Admit: 2015-01-24 | Discharge: 2015-01-24 | Disposition: A | Discharge status: Home / Self Care | Payer: Medicare Other | Source: Ambulatory Visit | Attending: Family | Admitting: Family

## 2015-01-24 ENCOUNTER — Encounter: Payer: Self-pay | Admitting: Family

## 2015-01-24 ENCOUNTER — Ambulatory Visit (INDEPENDENT_AMBULATORY_CARE_PROVIDER_SITE_OTHER): Payer: Medicare Other | Admitting: Family

## 2015-01-24 ENCOUNTER — Ambulatory Visit (HOSPITAL_COMMUNITY)
Admission: RE | Admit: 2015-01-24 | Discharge: 2015-01-24 | Disposition: A | Discharge status: Home / Self Care | Payer: Medicare Other | Source: Ambulatory Visit | Attending: Family | Admitting: Family

## 2015-01-24 VITALS — BP 137/75 | HR 65 | Resp 16 | Ht 63.0 in | Wt 243.0 lb

## 2015-01-24 DIAGNOSIS — I739 Peripheral vascular disease, unspecified: Secondary | ICD-10-CM

## 2015-01-24 DIAGNOSIS — R531 Weakness: Secondary | ICD-10-CM | POA: Diagnosis not present

## 2015-01-24 DIAGNOSIS — R200 Anesthesia of skin: Secondary | ICD-10-CM

## 2015-01-24 DIAGNOSIS — Z87891 Personal history of nicotine dependence: Secondary | ICD-10-CM | POA: Diagnosis not present

## 2015-01-24 DIAGNOSIS — M7989 Other specified soft tissue disorders: Secondary | ICD-10-CM

## 2015-01-24 DIAGNOSIS — M25562 Pain in left knee: Secondary | ICD-10-CM | POA: Diagnosis not present

## 2015-01-24 DIAGNOSIS — R202 Paresthesia of skin: Secondary | ICD-10-CM

## 2015-01-24 DIAGNOSIS — Z48812 Encounter for surgical aftercare following surgery on the circulatory system: Secondary | ICD-10-CM

## 2015-01-24 DIAGNOSIS — Z9889 Other specified postprocedural states: Secondary | ICD-10-CM

## 2015-01-24 DIAGNOSIS — Z95828 Presence of other vascular implants and grafts: Secondary | ICD-10-CM

## 2015-01-24 NOTE — Progress Notes (Signed)
[facility] [place] HISTORY AND PHYSICAL -PAD  History of Present Illness Whitney Walker is a 59 y.o. female patient of Dr. Kellie Simmering returns for continued followup regarding her left femoral popliteal vein graft performed August 13, 2013. She had ischemia of the left fourth and fifth toes which has healed since her bypass.  She did have a pulmonary embolus November 24, 2013 and started [place].   Pt states she started noticing left fingers numbness and burning about April 2015, noticed left leg weakness and giving way at times since about April 2015. Burning sensation on outer aspect left foot since early 2015, aggravated by walking. Pt reports she is losing some of her memory. Pt states she is supposed to be referred to a neurologist by her PCP's office but states this has not happened yet. Pt denies any history of stroke.  Her walking is limited by dyspnea, does not seem to be limited by claudication; uses supplemental oxygen at home.  C/o productive cough and ear congestion for about 3 weeks, states she did not discuss with her PCP.  Pt Diabetic: No  Pt smoker: former smoker (is using vapor electronic cigarette with no nicotine now in efforts to quit smoking, she has been nicotine free since about December, 2015, states she is also exposed to second hand smoke)   Pt meds include:  Statin :No, she stopped on medical advice as headache may be caused by this  ASA: Yes Other anticoagulants/antiplatelets: Xarelto was stopped by Dr. Einar Gip about November 2015, she was taking this for hx of PE, pt states she was then advised to take a daily 325 mg ASA, no longer has headaches since she stopped Xarelto.    Past Medical History  Diagnosis Date  . Anxiety   . Arthritis   . COPD (chronic obstructive pulmonary disease)   . Depression   . Hypertension   . Meningitis   . Vertigo   . Colon cancer   . Peripheral vascular disease   . CKD (chronic kidney disease)     Stage III 03/2013  . Shortness of breath   . CHF (congestive heart failure)   . PE (pulmonary embolism)   . Coronary artery disease     Social History History  Substance Use Topics  . Smoking status: Former Smoker -- 1.50 packs/day for 40 years    Types: Cigarettes    Quit date: 09/12/2013  . Smokeless tobacco: Never Used     Comment: pt states that she is using the e cigs  . Alcohol Use: No     Comment: she used to drink alcohol, quit in 2010     Family History Family History  Problem Relation Age of Onset  . Cancer Mother 18    breast and bone  . Cancer Father 37    prostate  . Cancer Cousin 20    breast cancer   . Hypertension Sister   . Bleeding Disorder Sister     Past Surgical History  Procedure Laterality Date  . Cesarean section      X 3 [id number]  . Cholecystectomy    . Abdominal hysterectomy    . Diverticulosis surgery-2002  2012  . Colectomy  2010  . Cholecystectomy  1980/s  . Femoral-popliteal bypass graft Left 08/13/2013    Procedure: BYPASS GRAFT FEMORAL-POPLITEAL ARTERY WITH NON-REVERSED SAPHANEOUS VEIN; ULTRASOUND GUIDED;  Surgeon: Mal [name], MD;  Location: [place];  Service: Vascular;  Laterality: Left;  . Abdominal aortagram N/A  07/26/2013    Procedure: ABDOMINAL Maxcine Ham;  Surgeon: [name] Magn[place], MD;  Location: [facility] CATH LAB;  Service: Cardiovascular;  Laterality: N/A;  . Lower extremity angiogram Left 07/26/2013    Procedure: LOWER EXTREMITY ANGIOGRAM;  Surgeon: [name] Sisters, MD;  Location: [facility] CATH LAB;  Service: Cardiovascular;  Laterality: Left;    Allergies  Allergen Reactions  . Iohexol Other (See Comments)     Code: HIVES, Desc: HIVES W/ "DYE" USED FOR 1ST CT SCAN BUT NOT 2ND, NO PREMEDS USED, PT UNCERTAIN OF CIRCUMSTANCES,,?POSSIBLE MRI CONTRAST ALLERGY, ALL STUDIES DONE "SOMEWHERE" IN [place]//A.C., Onset Date: [id number]     Current Outpatient Prescriptions  Medication Sig Dispense Refill  . acetaminophen (TYLENOL) 325 MG tablet  Take 650 mg by mouth every 6 (six) hours as needed for mild pain, moderate pain, fever or headache.    [name] amLODipine (NORVASC) 10 MG tablet Take 10 mg by mouth daily.    [name] aspirin 325 MG tablet Take 325 mg by mouth daily.    . carvedilol (COREG) 6.25 MG tablet Take 12.5 mg by mouth 2 (two) times daily with a meal.      No current facility-administered medications for this visit.    ROS: See HPI for pertinent positives and negatives.   Physical Examination  Filed Vitals:   01/24/15 1454  BP: 137/75  Pulse: 65  Resp: 16  Height: 5\' 3"  (1.6 m)  Weight: 243 lb (110.224 kg)  SpO2: 97%   Body mass index is 43.06 kg/(m^2).  General: A&O x 3, WDWN, morbidly obese female.  Gait: normal  Eyes: PERRLA.  Pulmonary: CTAB, without wheezes , rales or rhonchi.  Cardiac: regular Rythm , without detected murmur.   Carotid Bruits  Left  Right    Negative  Negative    Aorta is not palpable.  Radial pulses: are 1 + palpable and =   VASCULAR EXAM:  Extremities without ischemic changes  without Gangrene; without open wounds.   LE Pulses  LEFT  RIGHT   POPLITEAL  not palpable  not palpable   POSTERIOR TIBIAL  Not palpable  Not palpable   DORSALIS PEDIS  ANTERIOR TIBIAL  2+palpable  2+palpable    Abdomen: soft, NT, no palpable masses.  Skin: no rashes, no ulcers.  Musculoskeletal: no muscle wasting or atrophy.  Neurologic: A&O X 3; Appropriate Affect ; SENSATION: normal; MOTOR FUNCTION: moving all extremities equally, motor strength 5/5 in arms, 4/5 in legs. Speech is fluent/normal. CN 2-12 intact.    Non-Invasive Vascular Imaging: DATE: 01/24/2015 LOWER EXTREMITY ARTERIAL DUPLEX EVALUATION    INDICATION: Follow up bypass graft     PREVIOUS INTERVENTION(S): Left femoral to above knee popliteal artery bypass graft on 08/13/13    DUPLEX EXAM:     RIGHT  LEFT   Peak Systolic Velocity (cm/s) Ratio (if abnormal) Waveform  Peak Systolic Velocity (cm/s)  Ratio (if abnormal) Waveform     Inflow Artery 129  T     Proximal Anastomosis 116  T     Proximal Graft 66  T     Mid Graft 76  B      Distal Graft 107  T     Distal Anastomosis 91  T     Outflow Artery 75  T  1.28 Today's ABI / TBI 1.19  0.97 Previous ABI / TBI (07/19/14 ) 1.02    Waveform:    M - Monophasic       B - Biphasic  T - Triphasic  If Ankle Brachial Index (ABI) or Toe Brachial Index (TBI) performed, please see complete report     ADDITIONAL FINDINGS: No internal vessel narrowing noted within the bypass graft or anastomosis.    IMPRESSION: Patent left leg bypass graft with no evidence of stenosis noted.    Compared to the previous exam:  No significant change noted when compared to the exam on 07/19/14.     ASSESSMENT: Whitney Walker is a 59 y.o. female who is s/p left femoral popliteal vein graft performed August 13, 2013. She had ischemia of the left fourth and fifth toes which has healed since her bypass.  She does not have claudication symptoms, no tissue loss. Her ABI's remain normal with all triphasic waveforms. The left arm and leg symptoms she is having are not due to arterial insufficiency.  Pt advised to see her PCP re her 3 weeks hx of cough and ear congestion, and re following up on her referral to a neurologist.   Face to face time with patient was 25 minutes. Over 50% of this time was spent on counseling and coordination of care.   PLAN:  I discussed in depth with the patient the nature of atherosclerosis, and emphasized the importance of maximal medical management including strict control of blood pressure, blood glucose, and lipid levels, obtaining regular exercise, and continued cessation of smoking.  The patient is aware that without maximal medical management the underlying atherosclerotic disease process will progress, limiting the benefit of any interventions.  Based on the patient's vascular studies and examination, pt will return to  clinic in 6 months for ABI's and left LE arterial Duplex according to surveillance timeline s/p LE arterial bypass.  The patient was given information about PAD including signs, symptoms, treatment, what symptoms should prompt the patient to seek immediate medical care, and risk reduction measures to take.  Clemon Chambers, RN, MSN, FNP-C [facility] Phone: [phone]  Clinic MD: Kellie Simmering  01/24/2015  3:07 PM

## 2015-01-24 NOTE — Patient Instructions (Signed)

## 2015-03-14 DIAGNOSIS — D631 Anemia in chronic kidney disease: Secondary | ICD-10-CM | POA: Diagnosis not present

## 2015-03-14 DIAGNOSIS — I129 Hypertensive chronic kidney disease with stage 1 through stage 4 chronic kidney disease, or unspecified chronic kidney disease: Secondary | ICD-10-CM | POA: Diagnosis not present

## 2015-03-14 DIAGNOSIS — N183 Chronic kidney disease, stage 3 (moderate): Secondary | ICD-10-CM | POA: Diagnosis not present

## 2015-03-14 DIAGNOSIS — E785 Hyperlipidemia, unspecified: Secondary | ICD-10-CM | POA: Diagnosis not present

## 2015-04-05 DIAGNOSIS — R7309 Other abnormal glucose: Secondary | ICD-10-CM | POA: Diagnosis not present

## 2015-04-05 DIAGNOSIS — H5330 Unspecified disorder of binocular vision: Secondary | ICD-10-CM | POA: Diagnosis not present

## 2015-04-05 DIAGNOSIS — N183 Chronic kidney disease, stage 3 (moderate): Secondary | ICD-10-CM | POA: Diagnosis not present

## 2015-04-05 DIAGNOSIS — E782 Mixed hyperlipidemia: Secondary | ICD-10-CM | POA: Diagnosis not present

## 2015-04-05 DIAGNOSIS — R413 Other amnesia: Secondary | ICD-10-CM | POA: Diagnosis not present

## 2015-04-05 DIAGNOSIS — I129 Hypertensive chronic kidney disease with stage 1 through stage 4 chronic kidney disease, or unspecified chronic kidney disease: Secondary | ICD-10-CM | POA: Diagnosis not present

## 2015-04-06 ENCOUNTER — Telehealth: Payer: Self-pay | Admitting: Hematology

## 2015-04-06 ENCOUNTER — Other Ambulatory Visit (HOSPITAL_BASED_OUTPATIENT_CLINIC_OR_DEPARTMENT_OTHER): Payer: Medicare Other

## 2015-04-06 ENCOUNTER — Other Ambulatory Visit: Payer: Self-pay | Admitting: Hematology

## 2015-04-06 ENCOUNTER — Encounter: Payer: Medicare Other | Admitting: Hematology

## 2015-04-06 DIAGNOSIS — D72829 Elevated white blood cell count, unspecified: Secondary | ICD-10-CM

## 2015-04-06 DIAGNOSIS — D649 Anemia, unspecified: Secondary | ICD-10-CM

## 2015-04-06 LAB — CBC & DIFF AND RETIC
BASO%: 0.1 % (ref 0.0–2.0)
Basophils Absolute: 0 10*3/uL (ref 0.0–0.1)
EOS%: 1.1 % (ref 0.0–7.0)
Eosinophils Absolute: 0.2 10*3/uL (ref 0.0–0.5)
HCT: 37.5 % (ref 34.8–46.6)
HGB: 11.8 g/dL (ref 11.6–15.9)
Immature Retic Fract: 8.7 % (ref 1.60–10.00)
LYMPH%: 19.5 % (ref 14.0–49.7)
MCH: 26.8 pg (ref 25.1–34.0)
MCHC: 31.5 g/dL (ref 31.5–36.0)
MCV: 85.2 fL (ref 79.5–101.0)
MONO#: 0.7 10*3/uL (ref 0.1–0.9)
MONO%: 5 % (ref 0.0–14.0)
NEUT#: 10.8 10*3/uL — ABNORMAL HIGH (ref 1.5–6.5)
NEUT%: 74.3 % (ref 38.4–76.8)
Platelets: 238 10*3/uL (ref 145–400)
RBC: 4.4 10*6/uL (ref 3.70–5.45)
RDW: 15.2 % — ABNORMAL HIGH (ref 11.2–14.5)
Retic %: 2.28 % — ABNORMAL HIGH (ref 0.70–2.10)
Retic Ct Abs: 100.32 10*3/uL — ABNORMAL HIGH (ref 33.70–90.70)
WBC: 14.5 10*3/uL — ABNORMAL HIGH (ref 3.9–10.3)
lymph#: 2.8 10*3/uL (ref 0.9–3.3)
nRBC: 0 % (ref 0–0)

## 2015-04-06 NOTE — Telephone Encounter (Signed)
pt did not want to keep appt due to MD had emergency....r.s. per pt request....pt ok and aware

## 2015-04-07 ENCOUNTER — Telehealth: Payer: Self-pay | Admitting: Hematology

## 2015-04-07 NOTE — Telephone Encounter (Signed)
Left message to confirm appointment for August. Mailed calendar

## 2015-04-12 ENCOUNTER — Telehealth: Payer: Self-pay | Admitting: *Deleted

## 2015-04-12 NOTE — Telephone Encounter (Signed)
TC from patient requesting information on lab results from 04/06/15. She is wanting to know if any of the lab work would indicate she has diabetes. Informed pt. That she just had a CBC with diff done and it would not reflect anything related to her diabetes.  Pt. verbalized understanding.

## 2015-04-23 NOTE — Progress Notes (Signed)
Pt was not seen, appointment rescheduled.  Whitney Walker

## 2015-04-23 NOTE — Progress Notes (Signed)
This encounter was created in error - please disregard.

## 2015-05-02 ENCOUNTER — Ambulatory Visit: Payer: Medicare Other | Admitting: Hematology

## 2015-05-04 ENCOUNTER — Encounter: Payer: Self-pay | Admitting: Neurology

## 2015-05-04 ENCOUNTER — Ambulatory Visit (INDEPENDENT_AMBULATORY_CARE_PROVIDER_SITE_OTHER): Payer: Medicare Other | Admitting: Neurology

## 2015-05-04 VITALS — BP 171/90 | HR 69 | Resp 18 | Ht 63.0 in | Wt 243.0 lb

## 2015-05-04 DIAGNOSIS — R519 Headache, unspecified: Secondary | ICD-10-CM

## 2015-05-04 DIAGNOSIS — G4733 Obstructive sleep apnea (adult) (pediatric): Secondary | ICD-10-CM | POA: Diagnosis not present

## 2015-05-04 DIAGNOSIS — E669 Obesity, unspecified: Secondary | ICD-10-CM

## 2015-05-04 DIAGNOSIS — R51 Headache: Secondary | ICD-10-CM | POA: Diagnosis not present

## 2015-05-04 DIAGNOSIS — R600 Localized edema: Secondary | ICD-10-CM | POA: Diagnosis not present

## 2015-05-04 DIAGNOSIS — IMO0001 Reserved for inherently not codable concepts without codable children: Secondary | ICD-10-CM

## 2015-05-04 DIAGNOSIS — R351 Nocturia: Secondary | ICD-10-CM | POA: Diagnosis not present

## 2015-05-04 DIAGNOSIS — R413 Other amnesia: Secondary | ICD-10-CM | POA: Diagnosis not present

## 2015-05-04 DIAGNOSIS — R030 Elevated blood-pressure reading, without diagnosis of hypertension: Secondary | ICD-10-CM | POA: Diagnosis not present

## 2015-05-04 NOTE — Progress Notes (Signed)
Subjective:    Patient ID: Whitney Walker is a 59 y.o. female.  HPI     Star Age, MD, PhD [facility] [address], [address] [place], [place] [address]  Dear Butch Penny,  I saw your patient, Whitney Walker, upon your kind request in my neurologic clinic today for initial consultation of her memory loss. The patient is unaccompanied today. As you know, Whitney Walker is a 59 year old right-handed woman with a complex medical history of hypertension, obesity, congestive heart failure, chronic kidney disease, COPD, peripheral vascular disease, DVT and PE in 2014, prior smoking with secondhand smoke exposure, who reports memory loss in the last 6 months.  She reports primarily forgetfulness. She moved from [place] in 2009 to be closer to her children. She has a son and a daughter. She has grandchildren and great-grandchildren. She lives alone. She is currently not driving as she does not have a car.  I reviewed your office note from 04/05/2015 which you kindly included. Also reviewed laboratory test results from your office from 04/05/2015: Hemoglobin A1c was 6.5, total cholesterol 229, LDL 128, triglycerides 253. Her blood pressure at the time was 160/80. BMI 45.   She has been seen by the vascular and vein specialists [place]. I reviewed their office note from 01/24/2015. She had left femoropopliteal vein graft performed in August 2014. She had a PE in November 2014 and was started on Xarelto. She is now on ASA. She has seen Dr. Einar Gip in cardiology. She has seen oncology for anemia and leukocytosis. I reviewed the office note from Dr. Burr Medico from 01/11/2015.  She had a head CT without contrast and CT cervical spine without contrast on 10/31/2011: 1. Normal noncontrast CT appearance of the brain. In addition, personally reviewed the images through the PACS system and agree with the findings. 1.  Negative neck soft tissues except for left greater than  right carotid calcified atherosclerosis. 2.  Evidence of pulmonary artery hypertension in the visualized chest (main PA 36 mm in diameter versus ascending aorta 32 mm).  She had a CTH without contrast on 03/09/14: this was reported as negative.  I personally reviewed the images through the PACS system and agree with the findings.   She was diagnosed with OSA years ago when she was still in [place], but gave her CPAP machine to her daughter and she reports that both her son and daughter have severe OSA. She had an O2 tank at home, but no longer has it. She has not used CPAP in some years. Her sleep study results are not available for reviewed.   She is not on cholesterol medication. She is supposed to start a diabetes medication, but has not picked up the prescription yet.   She reports snoring, gasping sensations in sleep, morning headaches, nocturia x 3-4 per night.  She denies a family history of dementia. She denies Hallucinations or delusions.  Her Past Medical History Is Significant For: Past Medical History  Diagnosis Date  . Anxiety   . Arthritis   . COPD (chronic obstructive pulmonary disease)   . Depression   . Hypertension   . Meningitis   . Vertigo   . Colon cancer   . Peripheral vascular disease   . CKD (chronic kidney disease)     Stage III 03/2013  . Shortness of breath   . CHF (congestive heart failure)   . PE (pulmonary embolism)   . Coronary artery disease   . Sleep apnea  Her Past Surgical History Is Significant For: Past Surgical History  Procedure Laterality Date  . Cesarean section      X 3 [id number]  . Cholecystectomy    . Abdominal hysterectomy    . Diverticulosis surgery-2002  2012  . Colectomy  2010  . Cholecystectomy  1980/s  . Femoral-popliteal bypass graft Left 08/13/2013    Procedure: BYPASS GRAFT FEMORAL-POPLITEAL ARTERY WITH NON-REVERSED SAPHANEOUS VEIN; ULTRASOUND GUIDED;  Surgeon: Mal [name], MD;  Location: [place];  Service:  Vascular;  Laterality: Left;  . Abdominal aortagram N/A 07/26/2013    Procedure: ABDOMINAL Maxcine Ham;  Surgeon: [name] Grapeview, MD;  Location: [facility] CATH LAB;  Service: Cardiovascular;  Laterality: N/A;  . Lower extremity angiogram Left 07/26/2013    Procedure: LOWER EXTREMITY ANGIOGRAM;  Surgeon: [name] [place], MD;  Location: [facility] CATH LAB;  Service: Cardiovascular;  Laterality: Left;    Her Family History Is Significant For: Family History  Problem Relation Age of Onset  . Cancer Mother 27    breast and bone  . Cancer Father 54    prostate  . Cancer Cousin 20    breast cancer   . Hypertension Sister   . Bleeding Disorder Sister     Her Social History Is Significant For: History   Social History  . Marital Status: Single    Spouse Name: N/A  . Number of Children: 4  . Years of Education: 12th gd   Occupational History  . N/A    Social History Main Topics  . Smoking status: Former Smoker -- 1.50 packs/day for 40 years    Types: Cigarettes    Quit date: 08/09/2014  . Smokeless tobacco: Never Used  . Alcohol Use: No     Comment: she used to drink alcohol, quit in 2010   . Drug Use: No  . Sexual Activity: Not on file   Other Topics Concern  . None   Social History Narrative   2 cups of coffee a day     Her Allergies Are:  Allergies  Allergen Reactions  . Iohexol Other (See Comments)     Code: HIVES, Desc: HIVES W/ "DYE" USED FOR 1ST CT SCAN BUT NOT 2ND, NO PREMEDS USED, PT UNCERTAIN OF CIRCUMSTANCES,,?POSSIBLE MRI CONTRAST ALLERGY, ALL STUDIES DONE "SOMEWHERE" IN [place]//A.C., Onset Date: [id number]   :   Her Current Medications Are:  Outpatient Encounter Prescriptions as of 05/04/2015  Medication Sig  . amLODipine (NORVASC) 10 MG tablet Take 10 mg by mouth daily.  [name] aspirin 325 MG tablet Take 325 mg by mouth daily.  . carvedilol (COREG) 6.25 MG tablet Take 12.5 mg by mouth 2 (two) times daily with a meal.   . [DISCONTINUED] acetaminophen (TYLENOL) 325 MG tablet  Take 650 mg by mouth every 6 (six) hours as needed for mild pain, moderate pain, fever or headache.   No facility-administered encounter medications on file as of 05/04/2015.  :  Review of Systems:  Out of a complete 14 point review of systems, all are reviewed and negative with the exception of these symptoms as listed below:    Review of Systems  HENT:       Spinning sensation   Eyes:       Blurred vision  Respiratory:       Snoring   Cardiovascular: Positive for leg swelling.  Endocrine:       Feeling cold   Neurological:       Short term memory loss, confusion, R sided  headache, numbness, L sided weakness, Insomnia, daytime sleepiness, restless legs, reports having a hard time falling asleep, nocturia, wakes up choking or gasping for air (Hx of O2 and CPAP use), wakes up feeling tired in the morning.   Psychiatric/Behavioral: Positive for suicidal ideas.       Depression, anxiety, Not enough sleep, decreased energy, disinterest in activities, racing thoughts.     Objective:  Neurologic Exam  Physical Exam Physical Examination:   Filed Vitals:   05/04/15 1427  BP: 171/90  Pulse: 69  Resp: 18   General Examination: The patient is a very pleasant 59 y.o. female in no acute distress. She is calm and cooperative with the exam. She denies Auditory Hallucinations and Visual Hallucinations. She is adequately groomed and situated in a chair.   HEENT: Normocephalic, atraumatic, pupils are equal, round and reactive to light and accommodation. Funduscopic exam is normal with sharp disc margins noted. Extraocular tracking shows no saccadic breakdown without nystagmus noted. Hearing is intact. Tympanic membranes are clear bilaterally. Face is symmetric with no facial masking and normal facial sensation. There is no lip, neck or jaw tremor. Neck is not rigid with intact passive ROM. There are no carotid bruits on auscultation. Oropharynx exam reveals moderate mouth dryness. She has full  dentures. She has a moderately crowded airway d/t thicker tongue, redundant soft palate and larger uvula. Tonsils are absent. Mallampati is class II. Tongue protrudes centrally and palate elevates symmetrically.    Chest: is clear to auscultation without wheezing, rhonchi or crackles noted.  Heart: sounds are regular and normal without murmurs, rubs or gallops noted.   Abdomen: is soft, non-tender and non-distended with normal bowel sounds appreciated on auscultation.  Extremities: There is 1+ pitting edema in the distal lower extremities bilaterally, L more than R. Pedal pulses are intact.   Skin: is warm and dry with no trophic changes noted.   Musculoskeletal: exam reveals no obvious joint deformities, tenderness or joint swelling or erythema.   Neurologically:  Mental status: The patient is awake and alert, paying good  attention. She is able to completely provide the history. She is oriented to: person, place, time/date, situation, day of week, month of year and year. Her memory, attention, language and knowledge are mildly impaired. There is no aphasia, agnosia, apraxia or anomia. There is a no bradyphrenia. Speech is not hypophonic with no dysarthria noted. Mood is congruent and affect is blunted.   On 05/04/2015: MMSE: 27/30, CDT: 2/4, AFT 10/min  Cranial nerves are as described above under HEENT exam. In addition, shoulder shrug is normal with equal shoulder height noted.  Motor exam: Normal bulk, and strength for age is noted. Tone is not rigid with absence of cogwheeling. There is no resting/postural or action tremor.   Romberg is negative. Reflexes are 2+ in the upper extremities and 2+ in the lower extremities. Toes are downgoing bilaterally. Fine motor skills: Finger taps, hand movements, and rapid alternating patting are not impaired bilaterally. Foot taps and foot agility are note impaired bilaterally.   Cerebellar testing shows no dysmetria or intention tremor on finger to nose  testing. Heel to shin is unremarkable. There is no truncal or gait ataxia.   Sensory exam is intact to light touch, pinprick, vibration, temperature sense and proprioception in the upper and lower extremities.   Gait, station and balance: She stands up from the seated position with no difficulty. No veering to one side is noted. No leaning to one side. Posture is age-appropriate. Stance  is narrow-based. Gait and balance are unremarkable, tandem walk is normal.   Assessment and Plan:    In summary, Whitney Walker is a very pleasant 59 y.o.-year old female with a complex medical history of hypertension, obesity, congestive heart failure, chronic kidney disease, COPD, peripheral vascular disease, DVT and PE in 2014, prior smoking with second hand smoke exposure, who reports and at least six-month history of memory loss. Her MMSE is 27, indicating mild memory issues. She has multiple risk factors for vascular disease. She may be at risk for vascular dementia. However, her situation is confounded by untreated obstructive sleep apnea. She is currently also undertreated for her hypertension and is supposed to start a new diabetes medication. She is also currently not treated for her hyperlipidemia. She is encouraged to discuss with you further management of her vascular risk factors. In addition, I explained to her that untreated obstructive sleep apnea, especially when moderate to severe, can contribute to cognitive dysfunction, and is an independent stroke and vascular disease risk factor. At this juncture, I suggested we proceed with a brain MRI and a sleep study. If she has sleep apnea she is encouraged to restart treatment with CPAP machine. She would be willing to do this. We will call her with her MRI results and I will see her back on her tests are done. At this point, I would not suggest any new medication for her memory but see how things play out when her overall risk factors under better control. She  is also advised to strive for weight loss. I answered all her questions today and the patient was in agreement with the above outlined plan.  Thank you very much for allowing me to participate in the care of this nice patient. If I can be of any further assistance to you please do not hesitate to call me at [phone].  Sincerely,   Star Age, MD, PhD  I spent 45 minutes in total face-to-face time with the patient, more than 50% of which was spent in counseling and coordination of care, reviewing test results, reviewing medication and discussing or reviewing the diagnosis of OSA and memory loss, its prognosis and treatment options.

## 2015-05-04 NOTE — Patient Instructions (Addendum)
Based on your symptoms and your exam I believe you are at risk for obstructive sleep apnea or OSA, and I think we should proceed with a sleep study to determine whether you do or do not have OSA and how severe it is. If you have more than mild OSA, I want you to consider treatment with CPAP. Please remember, the risks and ramifications of moderate to severe obstructive sleep apnea or OSA are: Cardiovascular disease, including congestive heart failure, stroke, difficult to control hypertension, arrhythmias, and even type 2 diabetes has been linked to untreated OSA. Sleep apnea causes disruption of sleep and sleep deprivation in most cases, which, in turn, can cause recurrent headaches, problems with memory, mood, concentration, focus, and vigilance. Most people with untreated sleep apnea report excessive daytime sleepiness, which can affect their ability to drive. Please do not drive if you feel sleepy.  I will see you back after your sleep study to go over the test results and where to go from there. We will call you after your sleep study and to set up an appointment at the time.   We will also do a brain MRI and call you with your test results.   Our sleep lab administrative assistant, Angelina Sheriff will call you to schedule your sleep study. If you don't hear back from her by next week please feel free to call her at [phone]. This is her direct line and please leave a message with your phone number to call back if you get the voicemail box.

## 2015-05-08 ENCOUNTER — Other Ambulatory Visit: Payer: Medicare Other

## 2015-05-08 ENCOUNTER — Ambulatory Visit
Admission: RE | Admit: 2015-05-08 | Discharge: 2015-05-08 | Disposition: A | Discharge status: Home / Self Care | Payer: Medicare Other | Source: Ambulatory Visit | Attending: Neurology | Admitting: Neurology

## 2015-05-08 DIAGNOSIS — E669 Obesity, unspecified: Secondary | ICD-10-CM

## 2015-05-08 DIAGNOSIS — R413 Other amnesia: Secondary | ICD-10-CM | POA: Diagnosis not present

## 2015-05-08 DIAGNOSIS — R351 Nocturia: Secondary | ICD-10-CM | POA: Diagnosis not present

## 2015-05-08 DIAGNOSIS — R51 Headache: Secondary | ICD-10-CM

## 2015-05-08 DIAGNOSIS — G4733 Obstructive sleep apnea (adult) (pediatric): Secondary | ICD-10-CM

## 2015-05-08 DIAGNOSIS — R030 Elevated blood-pressure reading, without diagnosis of hypertension: Secondary | ICD-10-CM

## 2015-05-08 DIAGNOSIS — R600 Localized edema: Secondary | ICD-10-CM

## 2015-05-08 DIAGNOSIS — IMO0001 Reserved for inherently not codable concepts without codable children: Secondary | ICD-10-CM

## 2015-05-08 DIAGNOSIS — R519 Headache, unspecified: Secondary | ICD-10-CM

## 2015-05-08 NOTE — Progress Notes (Signed)
Quick Note:  Please call patient regarding the recent brain MRI: The brain scan showed a normal structure of the brain. There were changes in the deeper structures of the brain, which we call white matter changes or microvascular changes. These were reported as mild to moderate in Her case. These are tiny white spots, that occur with time and are seen in a variety of conditions, including with normal aging, chronic hypertension, chronic headaches, especially migraine HAs, chronic diabetes, chronic hyperlipidemia. These are not strokes and no mass or lesion or contrast enhancement was seen which is reassuring. Again, there were no acute findings, such as a stroke, or mass or blood products. No further action is required on this test at this time, other than re-enforcing the importance of good blood pressure control, good cholesterol control, good blood sugar control, and weight management. Please remind patient to keep any upcoming appointments or tests and to call us with any interim questions, concerns, problems or updates. Thanks,  Star Age, MD, PhD    ______

## 2015-05-09 ENCOUNTER — Telehealth: Payer: Self-pay

## 2015-05-09 NOTE — Telephone Encounter (Signed)
-----   Message from Star Age, MD sent at 05/08/2015  5:51 PM EDT ----- Please call patient regarding the recent brain MRI: The brain scan showed a normal structure of the brain. There were changes in the deeper structures of the brain, which we call white matter changes or microvascular changes. These were reported as mild to moderate in Her case. These are tiny white spots, that occur with time and are seen in a variety of conditions, including with normal aging, chronic hypertension, chronic headaches, especially migraine HAs, chronic diabetes, chronic hyperlipidemia. These are not strokes and no mass or lesion or contrast enhancement was seen which is reassuring. Again, there were no acute findings, such as a stroke, or mass or blood products. No further action is required on this test at this time, other than re-enforcing the importance of good blood pressure control, good cholesterol control, good blood sugar control, and weight management. Please remind patient to keep any upcoming appointments or tests and to call us with any interim questions, concerns, problems or updates. Thanks,  Star Age, MD, PhD

## 2015-05-09 NOTE — Telephone Encounter (Signed)
I spoke to patient and she is aware of results below and reports understanding

## 2015-05-11 ENCOUNTER — Encounter: Payer: Self-pay | Admitting: Cardiology

## 2015-05-16 DIAGNOSIS — H04123 Dry eye syndrome of bilateral lacrimal glands: Secondary | ICD-10-CM | POA: Diagnosis not present

## 2015-05-16 DIAGNOSIS — E119 Type 2 diabetes mellitus without complications: Secondary | ICD-10-CM | POA: Diagnosis not present

## 2015-05-22 ENCOUNTER — Ambulatory Visit (INDEPENDENT_AMBULATORY_CARE_PROVIDER_SITE_OTHER): Payer: Medicare Other | Admitting: Neurology

## 2015-05-22 VITALS — BP 119/64 | HR 67 | Ht 63.0 in | Wt 243.0 lb

## 2015-05-22 DIAGNOSIS — G4733 Obstructive sleep apnea (adult) (pediatric): Secondary | ICD-10-CM | POA: Diagnosis not present

## 2015-05-22 DIAGNOSIS — G4761 Periodic limb movement disorder: Secondary | ICD-10-CM

## 2015-05-22 DIAGNOSIS — G479 Sleep disorder, unspecified: Secondary | ICD-10-CM

## 2015-05-23 NOTE — Sleep Study (Signed)
See scanned documents in Encounters tab

## 2015-05-31 ENCOUNTER — Telehealth: Payer: Self-pay | Admitting: Neurology

## 2015-05-31 DIAGNOSIS — G4733 Obstructive sleep apnea (adult) (pediatric): Secondary | ICD-10-CM

## 2015-05-31 DIAGNOSIS — G4731 Primary central sleep apnea: Secondary | ICD-10-CM

## 2015-05-31 NOTE — Telephone Encounter (Signed)
Diana: Pt seen on 05/04/15, split study on 05/22/15.  Ins: Medicare/Medicaid  Please call and notify patient that the recent sleep study confirmed the diagnosis of severe OSA. She did well with BiPAP during the study with significant improvement of the respiratory events. Therefore, I would like start the patient on BiPAP therapy at home by prescribing a machine for home use. I placed the order in the chart. The patient will need a follow up appointment with me in 8 to 10 weeks post set up that has to be scheduled; please go ahead and schedule while you have the patient on the phone and make sure patient understands the importance of keeping this window for the FU appointment, as it is often an insurance requirement and failing to adhere to this may result in losing coverage for sleep apnea treatment. 15 min follow-up should suffice, unless there is a 30 min FU slot available.  Please re-enforce the importance of compliance with treatment and the need for [place] to monitor compliance data - again an insurance requirement and good feedback for the patient as far as how they are doing.  Also remind patient, that any upcoming PAP machine or mask issues, should be first addressed with the DME company. Please ask if patient has a preference regarding DME company.  Please arrange for PAP set up at home through a DME company of patient's choice - once you have spoken to the patient - and faxed/routed report to PCP and referring MD (if other than PCP), you can close this encounter, thanks,   Star Age, MD, PhD [facility] ([place])

## 2015-06-01 NOTE — Telephone Encounter (Signed)
I spoke with patient and she is aware of results and would like to start BiPAP treatment. I will refer patient to [facility] and fax PSG to PCP. Patient states that she will be out of state between [place]. She is reluctant to start BiPAP treatment before trip due to the fact that she is flying. I explained to her that many people travel with their machines and the DME company can tell her exactly what to do. I also expressed to her that Dr. Rexene Alberts would like for her to start treatment as soon as possible due to the severity of her OSA. Patient states that she understands. Patient is aware that she needs f/u appt 30-60 days after starting BiPAP treatment but wanted to call back and schedule appt later. I will send her a reminder letter to make appt and stress the importance of compliance.

## 2015-06-07 DIAGNOSIS — I440 Atrioventricular block, first degree: Secondary | ICD-10-CM | POA: Diagnosis not present

## 2015-06-07 DIAGNOSIS — I10 Essential (primary) hypertension: Secondary | ICD-10-CM | POA: Diagnosis not present

## 2015-06-07 DIAGNOSIS — R600 Localized edema: Secondary | ICD-10-CM | POA: Diagnosis not present

## 2015-06-07 DIAGNOSIS — N183 Chronic kidney disease, stage 3 (moderate): Secondary | ICD-10-CM | POA: Diagnosis not present

## 2015-07-25 ENCOUNTER — Other Ambulatory Visit (HOSPITAL_COMMUNITY): Payer: Medicare Other

## 2015-07-25 ENCOUNTER — Ambulatory Visit: Payer: Medicare Other | Admitting: Family

## 2015-07-25 ENCOUNTER — Encounter (HOSPITAL_COMMUNITY): Payer: Medicare Other

## 2015-08-04 ENCOUNTER — Other Ambulatory Visit: Payer: Medicare Other

## 2015-08-04 ENCOUNTER — Encounter: Payer: Medicare Other | Admitting: Hematology

## 2015-08-04 NOTE — Progress Notes (Signed)
No show  This encounter was created in error - please disregard.

## 2015-08-07 ENCOUNTER — Telehealth: Payer: Self-pay | Admitting: Hematology

## 2015-08-07 NOTE — Telephone Encounter (Signed)
Called patient and left a message with a new appointment

## 2015-08-08 DIAGNOSIS — R0602 Shortness of breath: Secondary | ICD-10-CM | POA: Diagnosis not present

## 2015-08-08 DIAGNOSIS — I10 Essential (primary) hypertension: Secondary | ICD-10-CM | POA: Diagnosis not present

## 2015-08-08 DIAGNOSIS — R600 Localized edema: Secondary | ICD-10-CM | POA: Diagnosis not present

## 2015-08-14 ENCOUNTER — Ambulatory Visit (HOSPITAL_BASED_OUTPATIENT_CLINIC_OR_DEPARTMENT_OTHER): Payer: Medicare Other | Admitting: Hematology

## 2015-08-14 ENCOUNTER — Telehealth: Payer: Self-pay | Admitting: Hematology

## 2015-08-14 ENCOUNTER — Encounter: Payer: Self-pay | Admitting: Hematology

## 2015-08-14 ENCOUNTER — Other Ambulatory Visit (HOSPITAL_BASED_OUTPATIENT_CLINIC_OR_DEPARTMENT_OTHER): Payer: Medicare Other

## 2015-08-14 ENCOUNTER — Other Ambulatory Visit: Payer: Self-pay | Admitting: Hematology

## 2015-08-14 VITALS — BP 138/85 | HR 65 | Temp 98.2°F | Resp 18 | Ht 63.0 in | Wt 248.8 lb

## 2015-08-14 DIAGNOSIS — R0609 Other forms of dyspnea: Secondary | ICD-10-CM | POA: Diagnosis not present

## 2015-08-14 DIAGNOSIS — E2839 Other primary ovarian failure: Secondary | ICD-10-CM

## 2015-08-14 DIAGNOSIS — N183 Chronic kidney disease, stage 3 (moderate): Secondary | ICD-10-CM | POA: Diagnosis not present

## 2015-08-14 DIAGNOSIS — R51 Headache: Secondary | ICD-10-CM

## 2015-08-14 DIAGNOSIS — D72829 Elevated white blood cell count, unspecified: Secondary | ICD-10-CM

## 2015-08-14 DIAGNOSIS — D649 Anemia, unspecified: Secondary | ICD-10-CM | POA: Diagnosis not present

## 2015-08-14 DIAGNOSIS — Z85038 Personal history of other malignant neoplasm of large intestine: Secondary | ICD-10-CM

## 2015-08-14 DIAGNOSIS — Z780 Asymptomatic menopausal state: Secondary | ICD-10-CM

## 2015-08-14 DIAGNOSIS — C189 Malignant neoplasm of colon, unspecified: Secondary | ICD-10-CM

## 2015-08-14 DIAGNOSIS — Z1231 Encounter for screening mammogram for malignant neoplasm of breast: Secondary | ICD-10-CM

## 2015-08-14 LAB — CBC & DIFF AND RETIC
BASO%: 0.2 % (ref 0.0–2.0)
Basophils Absolute: 0 10*3/uL (ref 0.0–0.1)
EOS%: 1.1 % (ref 0.0–7.0)
Eosinophils Absolute: 0.2 10*3/uL (ref 0.0–0.5)
HCT: 36.6 % (ref 34.8–46.6)
HGB: 11.5 g/dL — ABNORMAL LOW (ref 11.6–15.9)
Immature Retic Fract: 9.9 % (ref 1.60–10.00)
LYMPH%: 22.2 % (ref 14.0–49.7)
MCH: 27.2 pg (ref 25.1–34.0)
MCHC: 31.4 g/dL — ABNORMAL LOW (ref 31.5–36.0)
MCV: 86.5 fL (ref 79.5–101.0)
MONO#: 0.9 10*3/uL (ref 0.1–0.9)
MONO%: 6.6 % (ref 0.0–14.0)
NEUT#: 9.5 10*3/uL — ABNORMAL HIGH (ref 1.5–6.5)
NEUT%: 69.9 % (ref 38.4–76.8)
Platelets: 224 10*3/uL (ref 145–400)
RBC: 4.23 10*6/uL (ref 3.70–5.45)
RDW: 14.9 % — ABNORMAL HIGH (ref 11.2–14.5)
Retic %: 2.07 % (ref 0.70–2.10)
Retic Ct Abs: 87.56 10*3/uL (ref 33.70–90.70)
WBC: 13.6 10*3/uL — ABNORMAL HIGH (ref 3.9–10.3)
lymph#: 3 10*3/uL (ref 0.9–3.3)

## 2015-08-14 NOTE — Telephone Encounter (Signed)
Gave avs & calendar for August 2017 °

## 2015-08-14 NOTE — Progress Notes (Signed)
[facility]  Telephone:([phone]) [phone] Fax:([phone]) [place] consult Note   Patient Care Team: Glendale Chard, MD as PCP - General (Internal Medicine) Boyce Medici, FNP as Nurse Practitioner (Nurse Practitioner) 08/14/2015  CHIEF COMPLAINTS/PURPOSE OF CONSULTATION:  Mild leukocytosis   HISTORY OF PRESENTING ILLNESS:  Whitney Walker 59 y.o. female is referred here by her PCP Dr. Baird Cancer for mild leukocytosis.   She was previously seen by Dr. Jamse Arn in 2013 for leukocytosis. Her WBC has been around 11-18K since 08/2009, with predominantly neutrophils. BCR/ABL and check to mutation were checked in 2013 which were all negative. She also had a mild anemia in the past with hemoglobin in the 11-12 range, her platelet count has been normal.  She had PE in 10/2013 and was treated with Xaralto until 11/2014, she is now on aspirin. She has PVD and had left femoral popliteal vein bypass surgery in 07/2013. She also has COPD, she did use oxygen after she had PE, but not any more since Nov 2015 due to financial reasons. She has mild to moderate dyspenia on exertion.   She had tried quitting smoking a few times and finally stopped about 5 weeks ago.   She has had sinus infection on monthly bases, and recently caught a cold from her grand-grandson. She has headache for the past 4-5 month. She is being referred to see a neurologist.   She had early stage colon cancer in 2009, had surgical resection and no adjuvant chemo was needed. She had last colonoscopy in 05/2012  which was unremarkable. Her last mammogram was 2 years ago and she is planning to have a repeated one soon.   INTERIM HISTORY Jalila returns for follow up. She is doing well overall. She has been under a huge stress in the past few months due to her granddaughter's severe illness in June. She has been busy on taking care of of her family. She denies any significant pain, no episodes of fever, chills, or infection. She  has mild bilateral ankle swollen, no leg pain, no other new complaints.   MEDICAL HISTORY:  Past Medical History  Diagnosis Date  . Anxiety   . Arthritis   . COPD (chronic obstructive pulmonary disease)   . Depression   . Hypertension   . Meningitis   . Vertigo   . Colon cancer   . Peripheral vascular disease   . CKD (chronic kidney disease)     Stage III 03/2013  . Shortness of breath   . CHF (congestive heart failure)   . PE (pulmonary embolism)   . Coronary artery disease   . Sleep apnea     SURGICAL HISTORY: Past Surgical History  Procedure Laterality Date  . Cesarean section      X 3 [id number]  . Cholecystectomy    . Abdominal hysterectomy    . Diverticulosis surgery-2002  2012  . Colectomy  2010  . Cholecystectomy  1980/s  . Femoral-popliteal bypass graft Left 08/13/2013    Procedure: BYPASS GRAFT FEMORAL-POPLITEAL ARTERY WITH NON-REVERSED SAPHANEOUS VEIN; ULTRASOUND GUIDED;  Surgeon: Mal [name], MD;  Location: [facility];  Service: Vascular;  Laterality: Left;  . Abdominal aortagram N/A 07/26/2013    Procedure: ABDOMINAL Maxcine Ham;  Surgeon: [name] North Wales, MD;  Location: [facility] CATH LAB;  Service: Cardiovascular;  Laterality: N/A;  . Lower extremity angiogram Left 07/26/2013    Procedure: LOWER EXTREMITY ANGIOGRAM;  Surgeon: [name] Paoli, MD;  Location: [facility] CATH LAB;  Service: Cardiovascular;  Laterality: Left;    SOCIAL HISTORY: Social History   Social History  . Marital Status: Single    Spouse Name: N/A  . Number of Children: 4  . Years of Education: 12th gd   Occupational History  . N/A    Social History Main Topics  . Smoking status: Former Smoker -- 1.50 packs/day for 40 years    Types: Cigarettes    Quit date: 08/09/2014  . Smokeless tobacco: Never Used  . Alcohol Use: No     Comment: she used to drink alcohol, quit in 2010   . Drug Use: No  . Sexual Activity: Not on file   Other Topics Concern  . Not on file   Social History Narrative   2 cups  of coffee a day     FAMILY HISTORY: Family History  Problem Relation Age of Onset  . Cancer Mother 6    breast and bone  . Cancer Father 26    prostate  . Cancer Cousin 20    breast cancer   . Hypertension Sister   . Bleeding Disorder Sister     ALLERGIES:  is allergic to iohexol.  MEDICATIONS:  Current Outpatient Prescriptions  Medication Sig Dispense Refill  . amLODipine (NORVASC) 10 MG tablet Take 10 mg by mouth daily.    [name] aspirin 325 MG tablet Take 325 mg by mouth daily.    [name] BYSTOLIC 10 MG tablet Take 10 mg by mouth daily.     No current facility-administered medications for this visit.    REVIEW OF SYSTEMS:   Constitutional: Denies fevers, chills or abnormal night sweats Eyes: Denies blurriness of vision, double vision or watery eyes Ears, nose, mouth, throat, and face: Denies mucositis or sore throat Respiratory: Denies cough, dyspnea or wheezes Cardiovascular: Denies palpitation, chest discomfort or lower extremity swelling Gastrointestinal:  Denies nausea, heartburn or change in bowel habits Skin: Denies abnormal skin rashes Lymphatics: Denies new lymphadenopathy or easy bruising Neurological:Denies numbness, tingling or new weaknesses Behavioral/Psych: Mood is stable, no new changes  All other systems were reviewed with the patient and are negative.  PHYSICAL EXAMINATION: ECOG PERFORMANCE STATUS: 0 - Asymptomatic  Filed Vitals:   08/14/15 1241  BP: 138/85  Pulse: 65  Temp: 98.2 F (36.8 C)  Resp: 18   Filed Weights   08/14/15 1241  Weight: 248 lb 12.8 oz (112.855 kg)    GENERAL:alert, no distress and comfortable SKIN: skin color, texture, turgor are normal, no rashes or significant lesions EYES: normal, conjunctiva are pink and non-injected, sclera clear OROPHARYNX:no exudate, no erythema and lips, buccal mucosa, and tongue normal  NECK: supple, thyroid normal size, non-tender, without nodularity LYMPH:  no palpable lymphadenopathy in the  cervical, axillary or inguinal LUNGS: clear to auscultation and percussion with normal breathing effort HEART: regular rate & rhythm and no murmurs and no lower extremity edema ABDOMEN:abdomen soft, non-tender and normal bowel sounds Musculoskeletal:no cyanosis of digits and no clubbing  PSYCH: alert & oriented x 3 with fluent speech NEURO: no focal motor/sensory deficits  LABORATORY DATA:  I have reviewed the data as listed CBC Latest Ref Rng 08/14/2015 04/06/2015 01/11/2015  WBC 3.9 - 10.3 10e3/uL 13.6(H) 14.5(H) 12.0(H)  Hemoglobin 11.6 - 15.9 g/dL 11.5(L) 11.8 12.1  Hematocrit 34.8 - 46.6 % 36.6 37.5 38.6  Platelets 145 - 400 10e3/uL 224 238 226    CMP Latest Ref Rng 01/11/2015 03/09/2014 11/14/2013  Glucose 70 - 140 mg/dl 92 143(H) 115(H)  BUN 7.0 - 26.0  mg/dL 24.5 28(H) 26(H)  Creatinine 0.6 - 1.1 mg/dL 1.9(H) 1.71(H) 1.65(H)  Sodium 136 - 145 mEq/L 139 141 137  Potassium 3.5 - 5.1 mEq/L 4.4 4.8 4.2  Chloride 96 - 112 mEq/L - 107 104  CO2 22 - 29 mEq/L 21(L) 23 24  Calcium 8.4 - 10.4 mg/dL 9.2 9.9 9.6  Total Protein 6.4 - 8.3 g/dL 7.9 - 7.7  Total Bilirubin 0.20 - 1.20 mg/dL 0.36 - 0.2(L)  Alkaline Phos 40 - 150 U/L 123 - 102  AST 5 - 34 U/L 37(H) - 22  ALT 0 - 55 U/L 37 - 24     RADIOGRAPHIC STUDIES: I have personally reviewed the radiological images as listed and agreed with the findings in the report. No results found.  ASSESSMENT & PLAN:  59 year old female with chronic mild leukocytosis and anemia.  1. Mild leukocytosis, with predominant neutrophils -Her total white count today is 13.6 K, with neutrophil 9.5K, and the rest of the white cell within normal limits. Her mild leukocytosis has been stable over the past to 3 years, previous workup was negative for BCR/ABL or JAK 2 mutation. This is likely reactive, secondary to her smoking and chronic inflammations (allergy and sinus Infection). No evidence of myeloproliferative neoplasm.  -Follow-up CBC once a year  2.  Mild normocytic anemia, likely secondary to her CKD -She had hemoglobin 11-12 in the past a few years, overall stable, likely related to her stage III chronic kidney disease  -. His iron study and SPEP were normal in 2013, we'll repeated next year  -Follow-up CBC once a year   3. History of colon cancer in 2009  -She has computed 5 years of surveillance.  -last colonoscopy in 05/2012, next due 2018   4.  History of PE -Off anticoagulation now. Observation. -she is on aspirin 325mg  daily   5. CKD, stage III -follow up with primary care physician  6. Cancer screening -Her last mammogram was in 2013, I strongly encouraged her to repeat her mammogram. I will schedule it for her.  Follow-up: I discussed that the above issues can be followed with her primary care physician, or I can see her once a year. She opted the later. I'll see her back in 1 year with lab CBC, CMP and iron study.   All questions were answered. The patient knows to call the clinic with any problems, questions or concerns. I spent 30 minutes counseling the patient face to face. The total time spent in the appointment was 40 minutes and more than 50% was on counseling.     Truitt Merle, MD 08/14/2015

## 2015-08-14 NOTE — Telephone Encounter (Signed)
per pof to sch pt mamma-cld & sch-cld pt * left a message of time/date/location

## 2015-08-15 ENCOUNTER — Encounter: Payer: Self-pay | Admitting: Family

## 2015-08-16 ENCOUNTER — Encounter: Payer: Self-pay | Admitting: Family

## 2015-08-16 ENCOUNTER — Other Ambulatory Visit: Payer: Self-pay | Admitting: Family

## 2015-08-16 ENCOUNTER — Ambulatory Visit (INDEPENDENT_AMBULATORY_CARE_PROVIDER_SITE_OTHER)
Admission: RE | Admit: 2015-08-16 | Discharge: 2015-08-16 | Disposition: A | Discharge status: Home / Self Care | Payer: Medicare Other | Source: Ambulatory Visit | Attending: Family | Admitting: Family

## 2015-08-16 ENCOUNTER — Ambulatory Visit (INDEPENDENT_AMBULATORY_CARE_PROVIDER_SITE_OTHER): Payer: Medicare Other | Admitting: Family

## 2015-08-16 ENCOUNTER — Ambulatory Visit (HOSPITAL_COMMUNITY)
Admission: RE | Admit: 2015-08-16 | Discharge: 2015-08-16 | Disposition: A | Discharge status: Home / Self Care | Payer: Medicare Other | Source: Ambulatory Visit | Attending: Family | Admitting: Family

## 2015-08-16 VITALS — BP 184/89 | HR 61 | Ht 63.0 in | Wt 246.6 lb

## 2015-08-16 DIAGNOSIS — I739 Peripheral vascular disease, unspecified: Secondary | ICD-10-CM

## 2015-08-16 DIAGNOSIS — Z9889 Other specified postprocedural states: Secondary | ICD-10-CM

## 2015-08-16 DIAGNOSIS — Z720 Tobacco use: Secondary | ICD-10-CM

## 2015-08-16 DIAGNOSIS — Z95828 Presence of other vascular implants and grafts: Secondary | ICD-10-CM

## 2015-08-16 DIAGNOSIS — IMO0001 Reserved for inherently not codable concepts without codable children: Secondary | ICD-10-CM

## 2015-08-16 DIAGNOSIS — Z87891 Personal history of nicotine dependence: Secondary | ICD-10-CM | POA: Diagnosis not present

## 2015-08-16 DIAGNOSIS — R030 Elevated blood-pressure reading, without diagnosis of hypertension: Secondary | ICD-10-CM

## 2015-08-16 DIAGNOSIS — Z789 Other specified health status: Secondary | ICD-10-CM

## 2015-08-16 NOTE — Progress Notes (Signed)
[facility]  [facility]ISTORY AND PHYSICAL -PAD  History of Present Illness Whitney Walker is a 59 y.o. female patient of Dr. Kellie Simmering returns for continued followup regarding her left femoral popliteal vein graft performed August 13, 2013. She had ischemia of the left fourth and fifth toes which has healed since her bypass.  She did have a pulmonary embolus November 24, 2013 and started Jennye Moccasin but this has been stopped by her cardiologist.   Pt states she started noticing left fingers numbness and burning about April 2015, noticed left leg weakness and giving way at times since about April 2015. Burning sensation on outer aspect left foot since early 2015, aggravated by walking. Pt reports she is losing some of her memory. Pt states she saw a neurologist, Dr. Star Age, earlier this month for evaluation of sleep apnea, headaches, and memory loss.  Pt states she did not discuss with Dr. Dia Sitter her left fingers numbness and burning and left leg weakness. Pt has been on bipap at night for the last 4 days which has resolved her headaches.  Pt denies any history of stroke.   She last saw her PCP in April, 2016, will again see on August 31.  Her grand daughter is on life support recently and pt is overwhelmed by this.   Pt Diabetic: No  Pt smoker: former smoker (is using vapor electronic cigarette with no nicotine for a while in efforts to quit smoking, but has resumed nicotine in the vapor product due to stress, states she is also exposed to second hand smoke)   Pt meds include:  Statin :No, she stopped on medical advice as headache may be caused by this  ASA: Yes Other anticoagulants/antiplatelets: Xarelto was stopped by Dr. Einar Gip about November 2015, she was taking this for hx of PE, pt states she was then advised to take a daily 325 mg ASA, no longer has headaches since she stopped Xarelto.    Past Medical History  Diagnosis Date  . Anxiety   . Arthritis    . COPD (chronic obstructive pulmonary disease)   . Depression   . Hypertension   . Meningitis   . Vertigo   . Colon cancer   . Peripheral vascular disease   . CKD (chronic kidney disease)     Stage III 03/2013  . Shortness of breath   . CHF (congestive heart failure)   . PE (pulmonary embolism)   . Coronary artery disease   . Sleep apnea   . Anemia     Social History Social History  Substance Use Topics  . Smoking status: Former Smoker -- 1.50 packs/day for 40 years    Types: Cigarettes    Quit date: 08/09/2014  . Smokeless tobacco: Never Used  . Alcohol Use: No     Comment: she used to drink alcohol, quit in 2010     Family History Family History  Problem Relation Age of Onset  . Cancer Mother 76    breast and bone  . Cancer Father 29    prostate  . Cancer Cousin 20    breast cancer   . Hypertension Sister   . Bleeding Disorder Sister   . Hypertension Daughter     Past Surgical History  Procedure Laterality Date  . Cesarean section      X 3 [id number]  . Cholecystectomy    . Abdominal hysterectomy    . Diverticulosis surgery-2002  2012  . Colectomy  2010  . Cholecystectomy  1980/s  . Femoral-popliteal bypass graft Left 08/13/2013    Procedure: BYPASS GRAFT FEMORAL-POPLITEAL ARTERY WITH NON-REVERSED SAPHANEOUS VEIN; ULTRASOUND GUIDED;  Surgeon: Mal [name], MD;  Location: [facility];  Service: Vascular;  Laterality: Left;  . Abdominal aortagram N/A 07/26/2013    Procedure: ABDOMINAL Maxcine Ham;  Surgeon: [name] North Haven, MD;  Location: [facility] CATH LAB;  Service: Cardiovascular;  Laterality: N/A;  . Lower extremity angiogram Left 07/26/2013    Procedure: LOWER EXTREMITY ANGIOGRAM;  Surgeon: [name] , MD;  Location: [facility] CATH LAB;  Service: Cardiovascular;  Laterality: Left;    Allergies  Allergen Reactions  . Iohexol Other (See Comments)     Code: HIVES, Desc: HIVES W/ "DYE" USED FOR 1ST CT SCAN BUT NOT 2ND, NO PREMEDS USED, PT UNCERTAIN OF CIRCUMSTANCES,,?POSSIBLE  MRI CONTRAST ALLERGY, ALL STUDIES DONE "SOMEWHERE" IN [place]//A.C., Onset Date: [id number]     Current Outpatient Prescriptions  Medication Sig Dispense Refill  . amLODipine (NORVASC) 10 MG tablet Take 10 mg by mouth daily.    [name] aspirin 325 MG tablet Take 325 mg by mouth daily.    [name] BYSTOLIC 10 MG tablet Take 10 mg by mouth daily.     No current facility-administered medications for this visit.    ROS: See HPI for pertinent positives and negatives.   Physical Examination  Filed Vitals:   08/16/15 1214 08/16/15 1224  BP: 186/89 184/89  Pulse: 61   Height: 5\' 3"  (1.6 m)   Weight: 246 lb 9.6 oz (111.857 kg)   SpO2: 91%    Body mass index is 43.69 kg/(m^2).  General: A&O x 3, WDWN, morbidly obese female.  Gait: normal  Eyes: PERRLA.  Pulmonary: CTAB, without wheezes , rales or rhonchi.  Cardiac: regular Rhythm, no detected murmur.   Carotid Bruits  Left  Right    Negative  Negative    Aorta is not palpable.  Radial pulses: are 1 + palpable and =   VASCULAR EXAM:  Extremities without ischemic changes  without Gangrene; without open wounds.   LE Pulses  LEFT  RIGHT   POPLITEAL  not palpable  not palpable   POSTERIOR TIBIAL  Not palpable  Not palpable   DORSALIS PEDIS  ANTERIOR TIBIAL  2+palpable  2+palpable    Abdomen: soft, NT, no palpable masses.  Skin: no rashes, no ulcers.  Musculoskeletal: no muscle wasting or atrophy.  Neurologic: A&O X 3; Appropriate Affect ; SENSATION: normal; MOTOR FUNCTION: moving all extremities equally, motor strength 5/5 in arms, 4/5 in legs. Speech is fluent/normal. CN 2-12 intact.          Non-Invasive Vascular Imaging: DATE: 08/16/2015 LOWER EXTREMITY ARTERIAL DUPLEX EVALUATION    Follow-up left lower extremity peripheral vascular disease     PREVIOUS INTERVENTION(S): Left femoropopliteal arterial bypass graft         RIGHT  LEFT   Ratio (if abnormal) Waveform  Peak  Systolic Velocity (cm/s) Ratio (if abnormal) Waveform    Inflow Artery 171  T    Proximal Anastomosis 204  T    Proximal Graft 86  T    Mid Graft 115  T     Distal Graft 132  T    Distal Anastomosis 135  T    Outflow Artery 94  T  1.20 Today's ABI / TBI 1.18  1.28 Previous ABI / TBI (01/24/2015 ) 1.19    Waveform:    M - Monophasic       B - Biphasic  T - Triphasic  If Ankle Brachial Index (ABI) or Toe Brachial Index (TBI) performed, please see complete report  ADDITIONAL FINDINGS:     Widely patent left femoropopliteal arterial bypass graft without evidence of restenosis or hyperplasia.     Compared to the previous exam:  No significant change compared to prior exam.      ASSESSMENT: Whitney Walker is a 59 y.o. female who is s/p left femoral popliteal vein graft performed August 13, 2013. She had ischemia of the left fourth and fifth toes which has healed since her bypass.  She does not have claudication symptoms, no tissue loss. Her ABI's remain normal with all triphasic waveforms, TBI's remain normal. The left fingers issues she is having are not due to arterial insufficiency.  Elevated blood pressure now; pt advised to see her PCP or cardiologist ASAP re this. Pt is advised to follow up with her neurologist re the left fingers numbness.   PLAN:   Based on the patient's vascular studies and examination, pt will return to clinic in 1 year with ABI's and left LE arterial Duplex.  I discussed in depth with the patient the nature of atherosclerosis, and emphasized the importance of maximal medical management including strict control of blood pressure, blood glucose, and lipid levels, obtaining regular exercise, and cessation of smoking.  The patient is aware that without maximal medical management the underlying atherosclerotic disease process will progress, limiting the benefit of any interventions.  The patient was given information about PAD including signs, symptoms,  treatment, what symptoms should prompt the patient to seek immediate medical care, and risk reduction measures to take.  Clemon Chambers, RN, MSN, FNP-C [facility] [employer] Phone: [phone]  Clinic MD: Scot Dock  08/16/2015 12:42 PM

## 2015-08-16 NOTE — Patient Instructions (Signed)

## 2015-08-21 DIAGNOSIS — I10 Essential (primary) hypertension: Secondary | ICD-10-CM | POA: Diagnosis not present

## 2015-08-21 DIAGNOSIS — R600 Localized edema: Secondary | ICD-10-CM | POA: Diagnosis not present

## 2015-08-21 DIAGNOSIS — I440 Atrioventricular block, first degree: Secondary | ICD-10-CM | POA: Diagnosis not present

## 2015-08-21 DIAGNOSIS — N183 Chronic kidney disease, stage 3 (moderate): Secondary | ICD-10-CM | POA: Diagnosis not present

## 2015-08-30 DIAGNOSIS — I129 Hypertensive chronic kidney disease with stage 1 through stage 4 chronic kidney disease, or unspecified chronic kidney disease: Secondary | ICD-10-CM | POA: Diagnosis not present

## 2015-08-30 DIAGNOSIS — G4700 Insomnia, unspecified: Secondary | ICD-10-CM | POA: Diagnosis not present

## 2015-08-30 DIAGNOSIS — J449 Chronic obstructive pulmonary disease, unspecified: Secondary | ICD-10-CM | POA: Diagnosis not present

## 2015-08-30 DIAGNOSIS — N183 Chronic kidney disease, stage 3 (moderate): Secondary | ICD-10-CM | POA: Diagnosis not present

## 2015-09-14 ENCOUNTER — Ambulatory Visit: Payer: Medicare Other

## 2015-09-27 ENCOUNTER — Ambulatory Visit
Admission: RE | Admit: 2015-09-27 | Discharge: 2015-09-27 | Disposition: A | Discharge status: Home / Self Care | Payer: Medicare Other | Source: Ambulatory Visit | Attending: Hematology | Admitting: Hematology

## 2015-09-27 DIAGNOSIS — Z1231 Encounter for screening mammogram for malignant neoplasm of breast: Secondary | ICD-10-CM | POA: Diagnosis not present

## 2015-09-29 ENCOUNTER — Other Ambulatory Visit: Payer: Self-pay | Admitting: Hematology

## 2015-09-29 DIAGNOSIS — R928 Other abnormal and inconclusive findings on diagnostic imaging of breast: Secondary | ICD-10-CM

## 2015-10-02 ENCOUNTER — Ambulatory Visit (INDEPENDENT_AMBULATORY_CARE_PROVIDER_SITE_OTHER): Payer: Medicare Other | Admitting: Neurology

## 2015-10-02 ENCOUNTER — Encounter: Payer: Self-pay | Admitting: Neurology

## 2015-10-02 VITALS — BP 168/98 | HR 70 | Resp 20 | Ht 63.0 in | Wt 241.0 lb

## 2015-10-02 DIAGNOSIS — E669 Obesity, unspecified: Secondary | ICD-10-CM | POA: Diagnosis not present

## 2015-10-02 DIAGNOSIS — Z789 Other specified health status: Secondary | ICD-10-CM

## 2015-10-02 DIAGNOSIS — IMO0001 Reserved for inherently not codable concepts without codable children: Secondary | ICD-10-CM

## 2015-10-02 DIAGNOSIS — R030 Elevated blood-pressure reading, without diagnosis of hypertension: Secondary | ICD-10-CM

## 2015-10-02 DIAGNOSIS — G4733 Obstructive sleep apnea (adult) (pediatric): Secondary | ICD-10-CM

## 2015-10-02 DIAGNOSIS — R413 Other amnesia: Secondary | ICD-10-CM

## 2015-10-02 NOTE — Progress Notes (Signed)
Subjective:    Patient ID: Whitney Walker is a 59 y.o. female.  HPI     Interim history:   Whitney Walker is a 59 year old right-handed woman with a complex medical history of hypertension, obesity, congestive heart failure, chronic kidney disease, COPD, peripheral vascular disease, DVT and PE in 2014, prior smoking and secondhand smoke exposure, who presents for follow-up consultation of her memory loss and obstructive sleep apnea. The patient is unaccompanied today. I first met her on 05/04/2015 at the request of her primary care provider, at which time she reported memory loss for the past 6 months. She reported forgetfulness. She also reported a prior diagnosis of obstructive sleep apnea and using oxygen in the past and she had discontinued both CPAP treatment and oxygen usage at home. She used to live in [place] and moved to [place] to be closer to her children. She had had recent blood work. Her MMSE was 27 out of 30, clock drawing was 2 out of 4, animal fluency was 10/m. She had vascular risk factors. I suggested she work with her primary care physician to optimize her hypertension, lipid management and she was also supposed to start a new diabetes medication. I suggested we proceed with a brain MRI without contrast and a sleep study. She had a split-night sleep study on 05/22/2015 and I went over her test results with her in detail today. Baseline sleep efficiency was severely reduced at 28.1% with a latency to sleep prolonged at 67 minutes and wake after sleep onset long at 86.5 minutes with severe sleep fragmentation noted. She had a markedly elevated arousal index. She had absence of slow-wave sleep and near absence of REM sleep with less than 1% REM sleep. Due to prolonged REM latency. Moderate snoring was noted. She slept almost exclusively on her back. Total AHI was highly elevated at 77 per hour, baseline oxygen saturation 94%, nadir was 87%. She was then titrated on positive  airway pressure. Sleep efficiency was 96.5%, latency to sleep only 2.5 minutes and wake after sleep onset only 6 minutes with mild sleep fragmentation noted. She had 23.9% of slow-wave sleep and an increased percentage of REM sleep at 45.2%. Average oxygen saturation was 93%, nadir was 72% during REM sleep. She was initially started on CPAP at a pressure of 5 cm and gradually increased to 15 cm. Due to residual sleep disordered breathing noted, she was switched to BiPAP therapy starting at 14/10 cm with gradual increase to 19/14 cm. AHI was 7.3 per hour on the final pressure with supine REM sleep achieved. Oxygen desaturation nadir on the final BiPAP pressure was 90%. Based on her test results I prescribed standard BiPAP therapy for home use at a pressure of 19/14 cm.  She had a brain MRI without contrast on 05/08/2015: This is an abnormal MRI of the brain without contrast showing white matter changes most compatible with small vessel ischemic changes. The extent is more than expected for age. There are no acute findings.  In addition, I personally reviewed the images through the PACS system.  We called her with her test results.  Today, 10/02/2015: I reviewed her BiPAP compliance data from 08/29/2015 through 09/27/2015 which is a total of 30 days during which time she used her machine 21 days with percent used days greater than 4 hours of only 67%, indicating mildly suboptimal compliance with an average usage of 4 hours and 46 minutes, residual AHI suboptimal at 12.4 per hour, leak acceptable with the  95th percentile at 13.5 L/m on a pressure of 19/14 cm standard BiPAP. Of note, she was compliant with a compliance percentage of 74% when I reviewed her compliance data from 08/09/2015 through 09/27/2015 which is a total of 50 days.  Today, 10/02/2015: She reports that she has trouble tolerating the BiPAP. She feels it is uncomfortable. She is under stress, her BP levels have been higher, she has been having  headaches. She is going to have to move. She admits, that she has skipped some nights. She never started a new diabetes medication. She had noticed improvement in her nocturia and daytime energy but is struggling with the BiPAP.   Previously:   05/04/2015: She reports memory loss in the last 6 months.  She reports primarily forgetfulness. She moved from [place] in 2009 to be closer to her children. She has a son and a daughter. She has grandchildren and great-grandchildren. She lives alone. She is currently not driving as she does not have a car.  I reviewed your office note from 04/05/2015 which you kindly included. Also reviewed laboratory test results from your office from 04/05/2015: Hemoglobin A1c was 6.5, total cholesterol 229, LDL 128, triglycerides 253. Her blood pressure at the time was 160/80. BMI 45.   She has been seen by the vascular and vein specialists [facility]. I reviewed their office note from 01/24/2015. She had left femoropopliteal vein graft performed in August 2014. She had a PE in November 2014 and was started on Xarelto. She is now on ASA. She has seen Dr. Einar Gip in cardiology. She has seen oncology for anemia and leukocytosis. I reviewed the office note from Dr. Burr Medico from 01/11/2015.  She had a head CT without contrast and CT cervical spine without contrast on 10/31/2011: 1. Normal noncontrast CT appearance of the brain. In addition, personally reviewed the images through the PACS system and agree with the findings. 1.  Negative neck soft tissues except for left greater than right carotid calcified atherosclerosis. 2.  Evidence of pulmonary artery hypertension in the visualized chest (main PA 36 mm in diameter versus ascending aorta 32 mm).  She had a CTH without contrast on 03/09/14: this was reported as negative.  I personally reviewed the images through the PACS system and agree with the findings.   She was diagnosed with OSA years ago when she was still in  [place], but gave her CPAP machine to her daughter and she reports that both her son and daughter have severe OSA. She had an O2 tank at home, but no longer has it. She has not used CPAP in some years. Her sleep study results are not available for reviewed.   She is not on cholesterol medication. She is supposed to start a diabetes medication, but has not picked up the prescription yet.   She reports snoring, gasping sensations in sleep, morning headaches, nocturia x 3-4 per night.   She denies a family history of dementia. She denies Hallucinations or delusions.  Her Past Medical History Is Significant For: Past Medical History  Diagnosis Date  . Anxiety   . Arthritis   . COPD (chronic obstructive pulmonary disease) ([place])   . Depression   . Hypertension   . Meningitis   . Vertigo   . Colon cancer ([place])   . Peripheral vascular disease ([place])   . CKD (chronic kidney disease)     Stage III 03/2013  . Shortness of breath   . CHF (congestive heart failure) ([place])   . PE (  pulmonary embolism)   . Coronary artery disease   . Sleep apnea   . Anemia     Her Past Surgical History Is Significant For: Past Surgical History  Procedure Laterality Date  . Cesarean section      X 3 [id number]  . Cholecystectomy    . Abdominal hysterectomy    . Diverticulosis surgery-2002  2012  . Colectomy  2010  . Cholecystectomy  1980/s  . Femoral-popliteal bypass graft Left 08/13/2013    Procedure: BYPASS GRAFT FEMORAL-POPLITEAL ARTERY WITH NON-REVERSED SAPHANEOUS VEIN; ULTRASOUND GUIDED;  Surgeon: Mal [name], MD;  Location: [facility];  Service: Vascular;  Laterality: Left;  . Abdominal aortagram N/A 07/26/2013    Procedure: ABDOMINAL Maxcine Ham;  Surgeon: [name] [place], MD;  Location: [facility] CATH LAB;  Service: Cardiovascular;  Laterality: N/A;  . Lower extremity angiogram Left 07/26/2013    Procedure: LOWER EXTREMITY ANGIOGRAM;  Surgeon: [name] Cheswold, MD;  Location: [facility] CATH LAB;  Service: Cardiovascular;   Laterality: Left;    Her Family History Is Significant For: Family History  Problem Relation Age of Onset  . Cancer Mother 58    breast and bone  . Cancer Father 6    prostate  . Cancer Cousin 20    breast cancer   . Hypertension Sister   . Bleeding Disorder Sister   . Hypertension Daughter     Her Social History Is Significant For: Social History   Social History  . Marital Status: Single    Spouse Name: N/A  . Number of Children: 4  . Years of Education: 12th gd   Occupational History  . N/A    Social History Main Topics  . Smoking status: Former Smoker -- 1.50 packs/day for 40 years    Types: Cigarettes    Quit date: 08/09/2014  . Smokeless tobacco: Never Used  . Alcohol Use: No     Comment: she used to drink alcohol, quit in 2010   . Drug Use: No  . Sexual Activity: Not Asked   Other Topics Concern  . None   Social History Narrative   2 cups of coffee a day     Her Allergies Are:  Allergies  Allergen Reactions  . Iohexol Other (See Comments)     Code: HIVES, Desc: HIVES W/ "DYE" USED FOR 1ST CT SCAN BUT NOT 2ND, NO PREMEDS USED, PT UNCERTAIN OF CIRCUMSTANCES,,?POSSIBLE MRI CONTRAST ALLERGY, ALL STUDIES DONE "SOMEWHERE" IN [place]//A.C., Onset Date: [id number]   :   Her Current Medications Are:  Outpatient Encounter Prescriptions as of 10/02/2015  Medication Sig  . amLODipine (NORVASC) 10 MG tablet Take 10 mg by mouth daily.  [name] aspirin 81 MG tablet Take 81 mg by mouth daily.  [name] BYSTOLIC 10 MG tablet Take 10 mg by mouth daily.  [name] PROAIR RESPICLICK 798 (90 BASE) MCG/ACT AEPB   . [DISCONTINUED] aspirin 325 MG tablet Take 325 mg by mouth daily.   No facility-administered encounter medications on file as of 10/02/2015.  :  Review of Systems:  Out of a complete 14 point review of systems, all are reviewed and negative with the exception of these symptoms as listed below:   Review of Systems  Neurological: Positive for headaches.       Patient  reports that she is still having headaches. She does not like her CPAP machine and wants to stop using it.     Objective:  Neurologic Exam  Physical Exam Physical Examination:   Filed Vitals:  10/02/15 1407  BP: 168/98  Pulse: 70  Resp: 20   General Examination: The patient is a very pleasant 59 y.o. female in no acute distress. She is calm and cooperative with the exam.   HEENT: Normocephalic, atraumatic, pupils are equal, round and reactive to light and accommodation. Extraocular tracking shows no saccadic breakdown without nystagmus noted. Hearing is intact. Face is symmetric with no facial masking and normal facial sensation. There is no lip, neck or jaw tremor. Neck is not rigid with intact passive ROM. There are no carotid bruits on auscultation. Oropharynx exam reveals moderate mouth dryness. She has no dentures in today. She has a moderately crowded airway d/t thicker tongue, redundant soft palate and larger uvula. Tonsils are absent. Mallampati is class II. Tongue protrudes centrally and palate elevates symmetrically.    Chest: is clear to auscultation without wheezing, rhonchi or crackles noted.  Heart: sounds are regular and normal without murmurs, rubs or gallops noted.   Abdomen: is soft, non-tender and non-distended with normal bowel sounds appreciated on auscultation.  Extremities: There is 1+ pitting edema in the distal lower extremities bilaterally, L more than R, unchanged. Pedal pulses are intact.   Skin: is warm and dry with no trophic changes noted.   Musculoskeletal: exam reveals no obvious joint deformities, tenderness or joint swelling or erythema.   Neurologically:  Mental status: The patient is awake and alert, paying good  attention. She is able to completely provide the history. She is oriented to: person, place, time/date, situation, day of week, month of year and year. Her memory, attention, language and knowledge are mildly impaired. There is no  aphasia, agnosia, apraxia or anomia. There is a no bradyphrenia. Speech is not hypophonic with no dysarthria noted. Mood is congruent and affect is blunted.   On 05/04/2015: MMSE: 27/30, CDT: 2/4, AFT 10/min.  Cranial nerves are as described above under HEENT exam. In addition, shoulder shrug is normal with equal shoulder height noted.  Motor exam: Normal bulk, and strength for age is noted. Tone is not rigid with absence of cogwheeling. There is no resting/postural or action tremor.   Romberg is negative. Reflexes are 2+ in the upper extremities and 2+ in the lower extremities. Toes are downgoing bilaterally. Fine motor skills: Finger taps, hand movements, and rapid alternating patting are not impaired bilaterally. Foot taps and foot agility are note impaired bilaterally.   Cerebellar testing shows no dysmetria or intention tremor. There is no truncal or gait ataxia.   Sensory exam is intact to light touch in the upper and lower extremities.   Gait, station and balance: She stands up from the seated position with no difficulty. No veering to one side is noted. No leaning to one side. Posture is age-appropriate. Stance is narrow-based. Gait and balance are unremarkable.   Assessment and Plan:    In summary, Whitney Walker is a very pleasant 59 year old female with a complex medical history of hypertension, obesity, congestive heart failure, chronic kidney disease, COPD, peripheral vascular disease, DVT and PE in 2014, prior smoking with ongoing second hand smoke exposure, who presents for FU of her memory loss and OSA, which is severe, on BiPAP with suboptimal compliance. Her physical exam is stable. We will monitor her memory scores and we talked about her MRI brain and sleep study results in detail today. She has multiple risk factors for vascular disease and is at risk for vascular dementia. She has elevated BP values and was supposed to start  a new diabetes medication, but did not. She is also  currently not treated for her hyperlipidemia. She is encouraged to FU with her PCP for further management of her vascular risk factors. In addition, I explained to her that untreated obstructive sleep apnea, especially when moderate to severe, can contribute to cognitive dysfunction, and is an independent stroke and vascular disease risk factor. At this juncture, I suggested she continue to try to use BiPAP. She would be willing to do this. She is encouraged to get in touch with her DME company for additional help with mask intolerance. I will see her back in about 3-4 months, sooner if needed. I answered all her questions today and she was in agreement.  I spent 25 minutes in total face-to-face time with the patient, more than 50% of which was spent in counseling and coordination of care, reviewing test results, reviewing medication and discussing or reviewing the diagnosis of OSA, memory loss, the prognosis and treatment options.

## 2015-10-02 NOTE — Patient Instructions (Signed)
Please continue using your BiPAP regularly. While your insurance requires that you use PAP at least 4 hours each night on 70% of the nights, I recommend, that you not skip any nights and use it throughout the night if you can. Getting used to PAP and staying with the treatment long term does take time and patience and discipline. Untreated obstructive sleep apnea when it is moderate to severe can have an adverse impact on cardiovascular health and raise her risk for heart disease, arrhythmias, hypertension, congestive heart failure, stroke and diabetes. Untreated obstructive sleep apnea causes sleep disruption, nonrestorative sleep, and sleep deprivation. This can have an impact on your day to day functioning and cause daytime sleepiness and impairment of cognitive function, memory loss, mood disturbance, and problems focussing. Using PAP regularly can improve these symptoms.   You may do better with another mask. Please get in touch with [facility].

## 2015-10-10 ENCOUNTER — Telehealth: Payer: Self-pay | Admitting: *Deleted

## 2015-10-10 ENCOUNTER — Other Ambulatory Visit: Payer: Medicare Other

## 2015-10-10 NOTE — Telephone Encounter (Signed)
Patient called reporting a "digital screening appointment for today at 2:10 at the breast center was cancelled.  No signature from Dr. Burr Medico."    [facility]  "09-27-2015 regular annual mammogram requires more test.  Request sent to provider in-basket 09-29-2015 and referral form faxed 10-06-2015.  Orders are currently signed but were signed after cut off to notify patient for next day appointments.  Will reschedule.  Called patient informing her to expect a call from [place] to reschedule tests.

## 2015-10-17 ENCOUNTER — Emergency Department (HOSPITAL_COMMUNITY): Payer: Medicare Other

## 2015-10-17 ENCOUNTER — Ambulatory Visit
Admission: RE | Admit: 2015-10-17 | Discharge: 2015-10-17 | Disposition: A | Discharge status: Home / Self Care | Payer: Medicare Other | Source: Ambulatory Visit | Attending: Hematology | Admitting: Hematology

## 2015-10-17 ENCOUNTER — Inpatient Hospital Stay (HOSPITAL_COMMUNITY)
Admission: EM | Admit: 2015-10-17 | Discharge: 2015-10-25 | DRG: 176 | Disposition: A | Discharge status: Home / Self Care | Payer: Medicare Other | Attending: Family Medicine | Admitting: Family Medicine

## 2015-10-17 ENCOUNTER — Encounter (HOSPITAL_COMMUNITY): Payer: Self-pay

## 2015-10-17 DIAGNOSIS — Z66 Do not resuscitate: Secondary | ICD-10-CM | POA: Diagnosis present

## 2015-10-17 DIAGNOSIS — E669 Obesity, unspecified: Secondary | ICD-10-CM | POA: Diagnosis present

## 2015-10-17 DIAGNOSIS — Z86711 Personal history of pulmonary embolism: Secondary | ICD-10-CM | POA: Diagnosis not present

## 2015-10-17 DIAGNOSIS — Z79899 Other long term (current) drug therapy: Secondary | ICD-10-CM

## 2015-10-17 DIAGNOSIS — F332 Major depressive disorder, recurrent severe without psychotic features: Secondary | ICD-10-CM | POA: Diagnosis present

## 2015-10-17 DIAGNOSIS — Z90710 Acquired absence of both cervix and uterus: Secondary | ICD-10-CM

## 2015-10-17 DIAGNOSIS — N184 Chronic kidney disease, stage 4 (severe): Secondary | ICD-10-CM | POA: Diagnosis not present

## 2015-10-17 DIAGNOSIS — F32A Depression, unspecified: Secondary | ICD-10-CM | POA: Diagnosis present

## 2015-10-17 DIAGNOSIS — K7460 Unspecified cirrhosis of liver: Secondary | ICD-10-CM | POA: Diagnosis present

## 2015-10-17 DIAGNOSIS — N179 Acute kidney failure, unspecified: Secondary | ICD-10-CM | POA: Diagnosis present

## 2015-10-17 DIAGNOSIS — Z7982 Long term (current) use of aspirin: Secondary | ICD-10-CM | POA: Diagnosis not present

## 2015-10-17 DIAGNOSIS — I2699 Other pulmonary embolism without acute cor pulmonale: Secondary | ICD-10-CM | POA: Diagnosis not present

## 2015-10-17 DIAGNOSIS — D72829 Elevated white blood cell count, unspecified: Secondary | ICD-10-CM | POA: Diagnosis present

## 2015-10-17 DIAGNOSIS — I2510 Atherosclerotic heart disease of native coronary artery without angina pectoris: Secondary | ICD-10-CM | POA: Diagnosis present

## 2015-10-17 DIAGNOSIS — F329 Major depressive disorder, single episode, unspecified: Secondary | ICD-10-CM | POA: Diagnosis not present

## 2015-10-17 DIAGNOSIS — R079 Chest pain, unspecified: Secondary | ICD-10-CM | POA: Diagnosis not present

## 2015-10-17 DIAGNOSIS — N19 Unspecified kidney failure: Secondary | ICD-10-CM

## 2015-10-17 DIAGNOSIS — I5032 Chronic diastolic (congestive) heart failure: Secondary | ICD-10-CM | POA: Diagnosis present

## 2015-10-17 DIAGNOSIS — Z91041 Radiographic dye allergy status: Secondary | ICD-10-CM | POA: Diagnosis not present

## 2015-10-17 DIAGNOSIS — Z85038 Personal history of other malignant neoplasm of large intestine: Secondary | ICD-10-CM

## 2015-10-17 DIAGNOSIS — J449 Chronic obstructive pulmonary disease, unspecified: Secondary | ICD-10-CM | POA: Diagnosis present

## 2015-10-17 DIAGNOSIS — I10 Essential (primary) hypertension: Secondary | ICD-10-CM | POA: Diagnosis not present

## 2015-10-17 DIAGNOSIS — Z6841 Body Mass Index (BMI) 40.0 and over, adult: Secondary | ICD-10-CM | POA: Diagnosis not present

## 2015-10-17 DIAGNOSIS — R0602 Shortness of breath: Secondary | ICD-10-CM

## 2015-10-17 DIAGNOSIS — E871 Hypo-osmolality and hyponatremia: Secondary | ICD-10-CM | POA: Diagnosis not present

## 2015-10-17 DIAGNOSIS — I129 Hypertensive chronic kidney disease with stage 1 through stage 4 chronic kidney disease, or unspecified chronic kidney disease: Secondary | ICD-10-CM | POA: Diagnosis present

## 2015-10-17 DIAGNOSIS — N183 Chronic kidney disease, stage 3 (moderate): Secondary | ICD-10-CM | POA: Diagnosis not present

## 2015-10-17 DIAGNOSIS — R1110 Vomiting, unspecified: Secondary | ICD-10-CM

## 2015-10-17 DIAGNOSIS — Z87891 Personal history of nicotine dependence: Secondary | ICD-10-CM

## 2015-10-17 DIAGNOSIS — I82412 Acute embolism and thrombosis of left femoral vein: Secondary | ICD-10-CM | POA: Diagnosis not present

## 2015-10-17 DIAGNOSIS — R928 Other abnormal and inconclusive findings on diagnostic imaging of breast: Secondary | ICD-10-CM

## 2015-10-17 DIAGNOSIS — I82409 Acute embolism and thrombosis of unspecified deep veins of unspecified lower extremity: Secondary | ICD-10-CM | POA: Diagnosis present

## 2015-10-17 DIAGNOSIS — I82402 Acute embolism and thrombosis of unspecified deep veins of left lower extremity: Secondary | ICD-10-CM | POA: Diagnosis present

## 2015-10-17 LAB — CBC
HCT: 37.7 % (ref 36.0–46.0)
Hemoglobin: 12.1 g/dL (ref 12.0–15.0)
MCH: 26.8 pg (ref 26.0–34.0)
MCHC: 32.1 g/dL (ref 30.0–36.0)
MCV: 83.4 fL (ref 78.0–100.0)
Platelets: 211 10*3/uL (ref 150–400)
RBC: 4.52 MIL/uL (ref 3.87–5.11)
RDW: 13.9 % (ref 11.5–15.5)
WBC: 17.1 10*3/uL — ABNORMAL HIGH (ref 4.0–10.5)

## 2015-10-17 LAB — COMPREHENSIVE METABOLIC PANEL
ALT: 21 U/L (ref 14–54)
AST: 27 U/L (ref 15–41)
Albumin: 2.6 g/dL — ABNORMAL LOW (ref 3.5–5.0)
Alkaline Phosphatase: 91 U/L (ref 38–126)
Anion gap: 9 (ref 5–15)
BUN: 25 mg/dL — ABNORMAL HIGH (ref 6–20)
CO2: 17 mmol/L — ABNORMAL LOW (ref 22–32)
Calcium: 10 mg/dL (ref 8.9–10.3)
Chloride: 114 mmol/L — ABNORMAL HIGH (ref 101–111)
Creatinine, Ser: 2.32 mg/dL — ABNORMAL HIGH (ref 0.44–1.00)
GFR calc Af Amer: 25 mL/min — ABNORMAL LOW (ref >60)
GFR calc non Af Amer: 22 mL/min — ABNORMAL LOW (ref >60)
Glucose, Bld: 122 mg/dL — ABNORMAL HIGH (ref 65–99)
Potassium: 4.6 mmol/L (ref 3.5–5.1)
Sodium: 140 mmol/L (ref 135–145)
Total Bilirubin: 0.6 mg/dL (ref 0.3–1.2)
Total Protein: 8 g/dL (ref 6.5–8.1)

## 2015-10-17 LAB — I-STAT TROPONIN, ED
Troponin i, poc: 0.02 ng/mL (ref 0.00–0.08)
Troponin i, poc: 0.01 ng/mL (ref 0.00–0.08)

## 2015-10-17 LAB — BRAIN NATRIURETIC PEPTIDE: B Natriuretic Peptide: 115.8 pg/mL — ABNORMAL HIGH (ref 0.0–100.0)

## 2015-10-17 LAB — PROTIME-INR
INR: 1.17 (ref 0.00–1.49)
Prothrombin Time: 15.1 seconds (ref 11.6–15.2)

## 2015-10-17 LAB — LIPASE, BLOOD: Lipase: 22 U/L (ref 22–51)

## 2015-10-17 IMAGING — CR DG CHEST 2V
2 series · 2 of 2 positions shown · non-contrast
Comparison: [DATE]

CLINICAL DATA: Chest pain for 3 days

EXAM:
CHEST  2 VIEW

[chest pa]
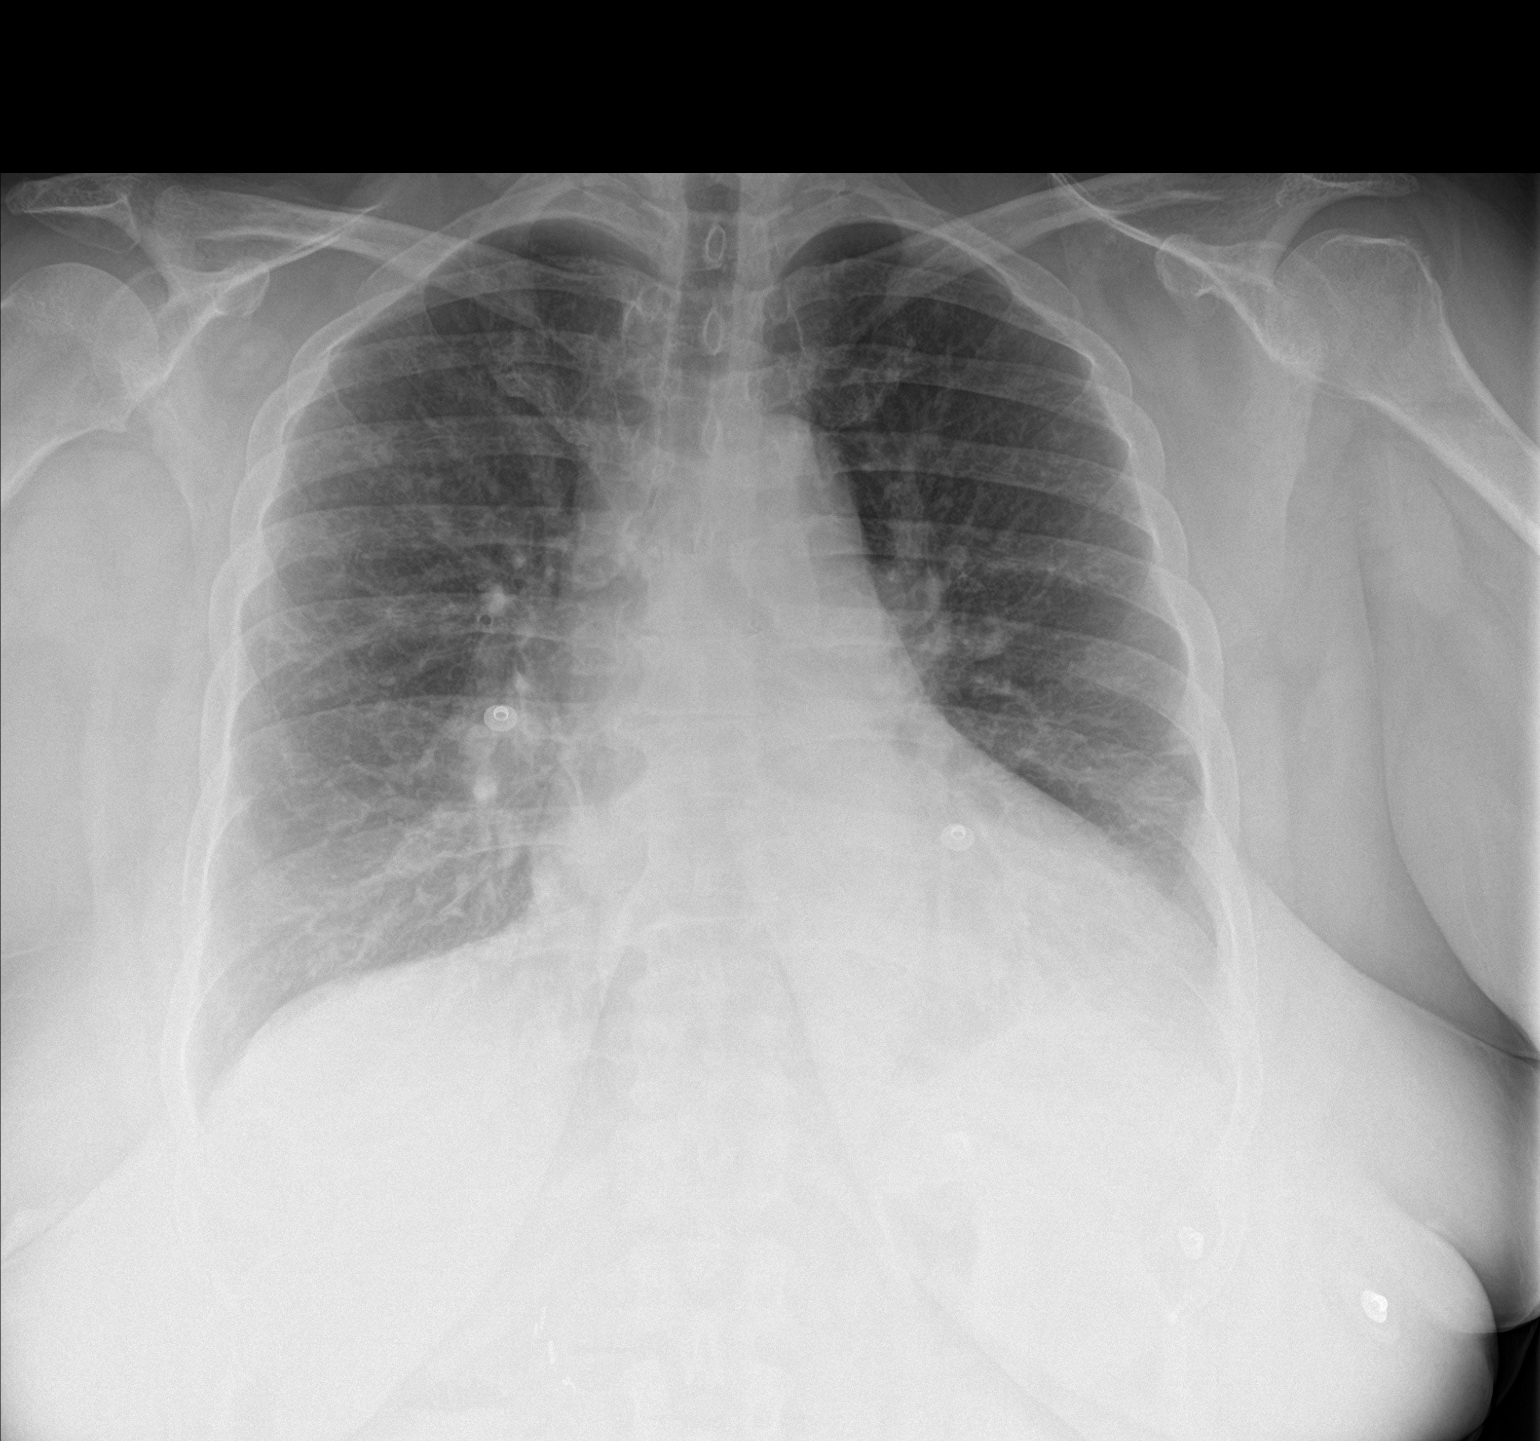

[chest lat]
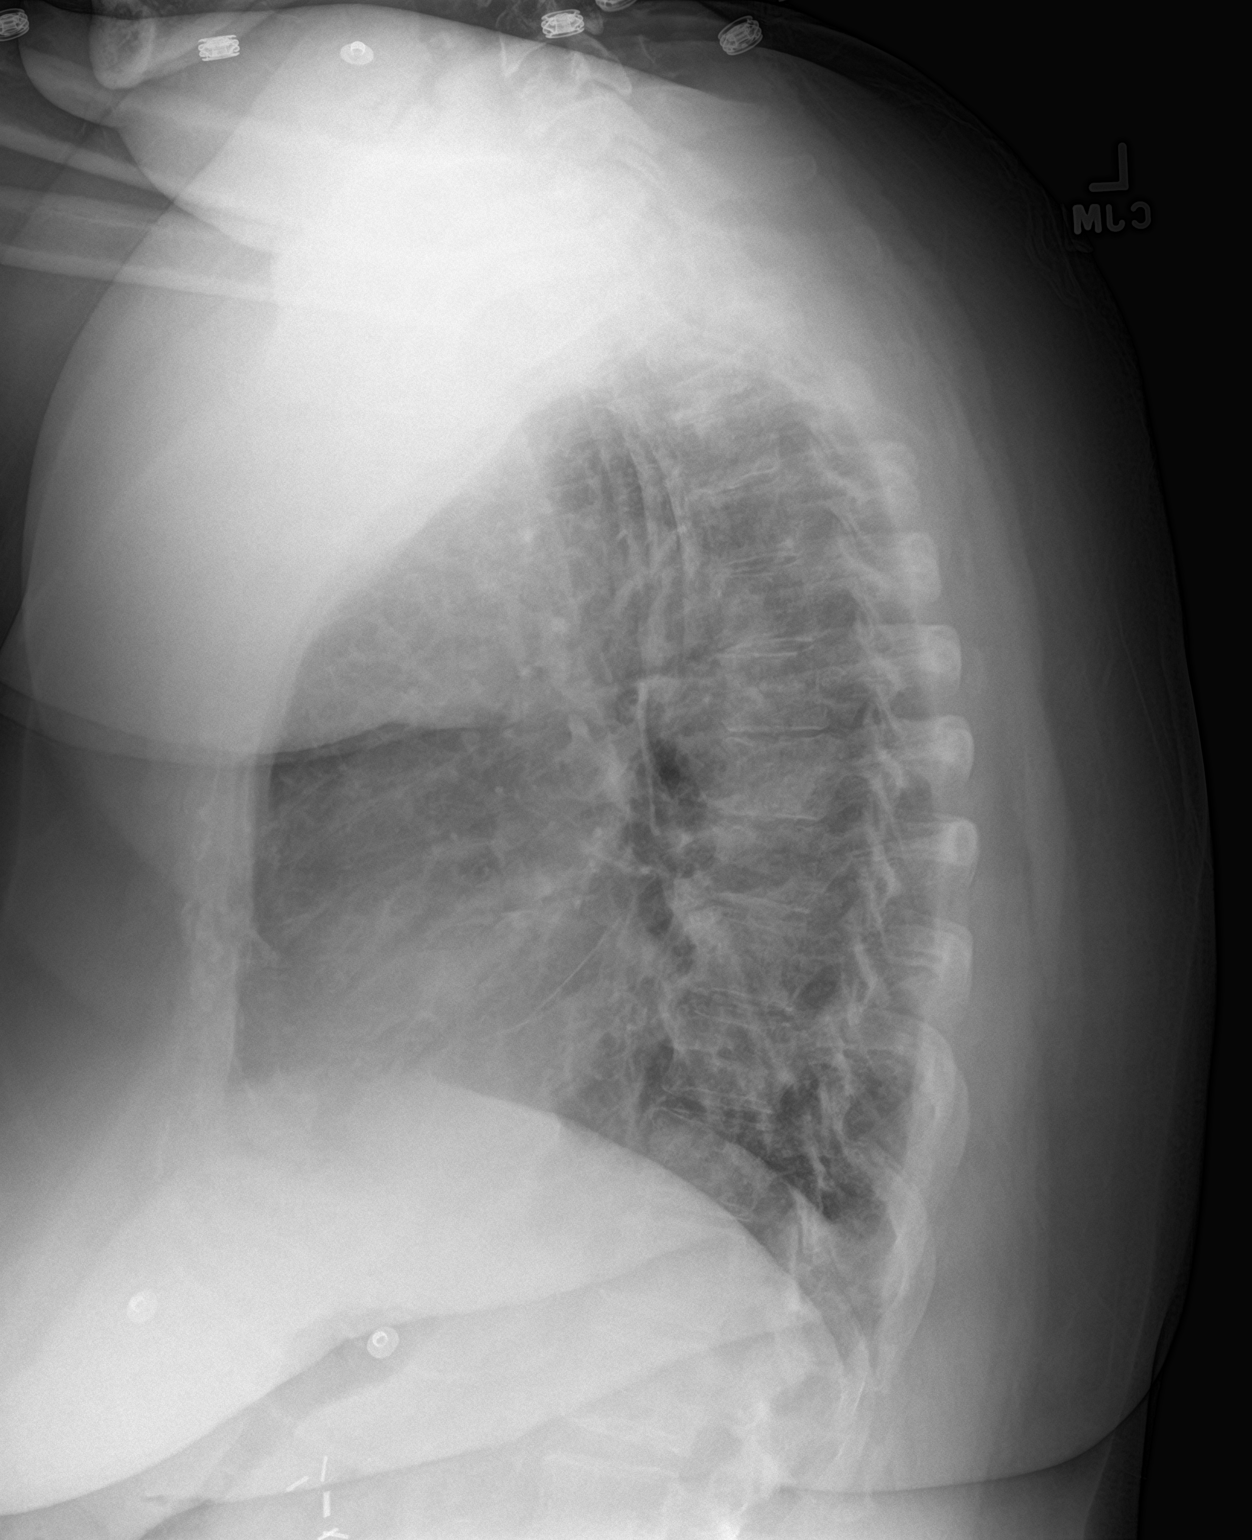

[2 of 2 positions shown; findings below may reference images not displayed]

FINDINGS: Borderline cardiomegaly. No acute infiltrate or pleural effusion. No
pulmonary edema. Degenerative changes mid and lower thoracic spine.
IMPRESSION: No active cardiopulmonary disease.

## 2015-10-17 IMAGING — NM NM PULMONARY VENT & PERF
11 series · 11 of 11 positions shown · non-contrast
Comparison: [DATE]

CLINICAL DATA: Shortness of Breath

EXAM:
NUCLEAR MEDICINE VENTILATION - PERFUSION LUNG SCAN
TECHNIQUE: Ventilation images were obtained in multiple projections using
inhaled aerosol [CF] DTPA. Perfusion images were obtained in
multiple projections after intravenous injection of [CF] MAA.
Lateral images of the lungs could not be obtained due to patient's
inability to tolerate procedure.
RADIOPHARMACEUTICALS:  30 mCi [CF] DTPA aerosol inhalation
and 3 mCi [CF] MAA IV

[Series 1: ant/post vent · 4.14mm/px · 1 of 1 slices shown]
[im 1/1]
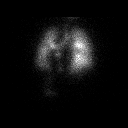

[Series 2: lao/rpo vent · 4.14mm/px · 1 of 1 slices shown (1 of 2)]
[im 1/1]
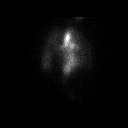

[Series 2: lao/rpo vent · 4.14mm/px · 1 of 1 slices shown (2 of 2)]
[im 1/1]
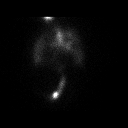

[Series 3: lpo/rao vent · 4.14mm/px · 1 of 1 slices shown (1 of 2)]
[im 1/1]
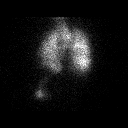

[Series 3: lpo/rao vent · 4.14mm/px · 1 of 1 slices shown (2 of 2)]
[im 1/1]
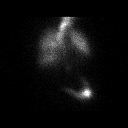

[Series 4: lpo/rao perf · 4.14mm/px · 1 of 1 slices shown (1 of 2)]
[im 1/1]
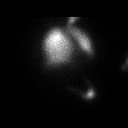

[Series 4: lpo/rao perf · 4.14mm/px · 1 of 1 slices shown (2 of 2)]
[im 1/1]
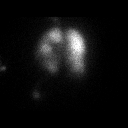

[Series 5: ant/post perf · 4.14mm/px · 1 of 1 slices shown (1 of 2)]
[im 1/1]
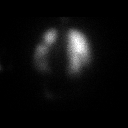

[Series 5: ant/post perf · 4.14mm/px · 1 of 1 slices shown (2 of 2)]
[im 1/1]
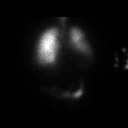

[Series 6: lao/rpo perf · 4.14mm/px · 1 of 1 slices shown (1 of 2)]
[im 1/1]
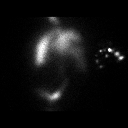

[Series 6: lao/rpo perf · 4.14mm/px · 1 of 1 slices shown (2 of 2)]
[im 1/1]
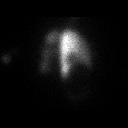

[11 of 11 positions shown; findings below may reference images not displayed]

FINDINGS: Ventilation: Somewhat limited due to lack of lateral imaging.
Ventilation images show no significant defect.

Perfusion: Perfusion images are also limited due the lack of lateral
imaging. There is a wedge-shaped defect identified which projects in
the left upper lobe posteriorly.
IMPRESSION: Somewhat limited exam due to the lack of lateral imaging. High
probability scan for pulmonary embolus in the left lung as
described.

## 2015-10-17 MED ORDER — HYDROMORPHONE HCL 1 MG/ML IJ SOLN
1.0000 mg | Freq: Once | INTRAMUSCULAR | Status: AC
  Administered 2015-10-17: 1 mg via INTRAVENOUS
  Filled 2015-10-17: qty 1

## 2015-10-17 MED ORDER — NEBIVOLOL HCL 10 MG PO TABS
10.0000 mg | ORAL_TABLET | Freq: Every day | ORAL | Status: DC
  Administered 2015-10-17 – 2015-10-21 (×5): 10 mg via ORAL
  Filled 2015-10-17 (×4): qty 2
  Filled 2015-10-17 (×6): qty 1
  Filled 2015-10-17: qty 2
  Filled 2015-10-17: qty 1
  Filled 2015-10-17: qty 2
  Filled 2015-10-17 (×2): qty 1
  Filled 2015-10-17: qty 2

## 2015-10-17 MED ORDER — OXYCODONE HCL 5 MG PO TABS
5.0000 mg | ORAL_TABLET | Freq: Four times a day (QID) | ORAL | Status: DC | PRN
  Administered 2015-10-18 – 2015-10-25 (×2): 5 mg via ORAL
  Filled 2015-10-17 (×5): qty 1

## 2015-10-17 MED ORDER — ONDANSETRON HCL 4 MG/2ML IJ SOLN
4.0000 mg | Freq: Four times a day (QID) | INTRAMUSCULAR | Status: DC | PRN
  Administered 2015-10-17: 4 mg via INTRAVENOUS
  Filled 2015-10-17 (×2): qty 2

## 2015-10-17 MED ORDER — HYDROMORPHONE HCL 1 MG/ML IJ SOLN
1.0000 mg | INTRAMUSCULAR | Status: DC | PRN
Start: 2015-10-17 — End: 2015-10-25
  Administered 2015-10-17 – 2015-10-20 (×9): 1 mg via INTRAVENOUS
  Filled 2015-10-17 (×9): qty 1

## 2015-10-17 MED ORDER — TECHNETIUM TC 99M DIETHYLENETRIAME-PENTAACETIC ACID: 30.0000 | Freq: Once | INTRAVENOUS | Status: DC | PRN

## 2015-10-17 MED ORDER — ASPIRIN EC 81 MG PO TBEC: 81.0000 mg | DELAYED_RELEASE_TABLET | Freq: Every day | ORAL | Status: DC

## 2015-10-17 MED ORDER — HEPARIN (PORCINE) IN NACL 100-0.45 UNIT/ML-% IJ SOLN
1950.0000 [IU]/h | INTRAMUSCULAR | Status: DC
  Administered 2015-10-17 – 2015-10-18 (×2): 1300 [IU]/h via INTRAVENOUS
  Filled 2015-10-17 (×5): qty 250

## 2015-10-17 MED ORDER — WARFARIN - PHARMACIST DOSING INPATIENT: Freq: Every day | Status: DC

## 2015-10-17 MED ORDER — WARFARIN SODIUM 7.5 MG PO TABS
7.5000 mg | ORAL_TABLET | Freq: Once | ORAL | Status: AC
  Administered 2015-10-17: 7.5 mg via ORAL
  Filled 2015-10-17 (×2): qty 1

## 2015-10-17 MED ORDER — NITROGLYCERIN 0.4 MG SL SUBL: 0.4000 mg | SUBLINGUAL_TABLET | SUBLINGUAL | Status: DC | PRN

## 2015-10-17 MED ORDER — MORPHINE SULFATE (PF) 4 MG/ML IV SOLN
4.0000 mg | Freq: Once | INTRAVENOUS | Status: DC
  Filled 2015-10-17: qty 1

## 2015-10-17 MED ORDER — ACETAMINOPHEN 325 MG PO TABS
650.0000 mg | ORAL_TABLET | Freq: Four times a day (QID) | ORAL | Status: DC | PRN
  Administered 2015-10-21 – 2015-10-24 (×3): 650 mg via ORAL
  Filled 2015-10-17 (×3): qty 2

## 2015-10-17 MED ORDER — ONDANSETRON HCL 4 MG/2ML IJ SOLN
4.0000 mg | Freq: Once | INTRAMUSCULAR | Status: AC
  Administered 2015-10-17: 4 mg via INTRAVENOUS
  Filled 2015-10-17: qty 2

## 2015-10-17 MED ORDER — AMLODIPINE BESYLATE 10 MG PO TABS
10.0000 mg | ORAL_TABLET | Freq: Every day | ORAL | Status: DC
  Administered 2015-10-17 – 2015-10-25 (×9): 10 mg via ORAL
  Filled 2015-10-17 (×9): qty 1

## 2015-10-17 MED ORDER — ASPIRIN 81 MG PO CHEW: 324.0000 mg | CHEWABLE_TABLET | Freq: Once | ORAL | Status: DC

## 2015-10-17 MED ORDER — TECHNETIUM TO 99M ALBUMIN AGGREGATED
3.0000 | Freq: Once | INTRAVENOUS | Status: AC | PRN
  Administered 2015-10-17: 3 via INTRAVENOUS

## 2015-10-17 MED ORDER — HEPARIN BOLUS VIA INFUSION
4500.0000 [IU] | Freq: Once | INTRAVENOUS | Status: AC
  Administered 2015-10-17: 4500 [IU] via INTRAVENOUS
  Filled 2015-10-17: qty 4500

## 2015-10-17 MED ORDER — SODIUM CHLORIDE 0.9 % IJ SOLN
3.0000 mL | Freq: Two times a day (BID) | INTRAMUSCULAR | Status: DC
  Administered 2015-10-17 – 2015-10-24 (×11): 3 mL via INTRAVENOUS

## 2015-10-17 NOTE — ED Notes (Signed)
Pt being transported x-ray.

## 2015-10-17 NOTE — ED Provider Notes (Addendum)
CSN: [id number]     Arrival date & time 10/17/15  1513 History   First MD Initiated Contact with Patient 10/17/15 1517     Chief Complaint  Patient presents with  . Chest Pain     (Consider location/radiation/quality/duration/timing/severity/associated sxs/prior Treatment) HPI..... Left sided chest pain with radiation to left back, left upper arm, left jaw for 3 days with associated dyspnea. Patient is only able to walk a short distance without dyspnea. Past medical history includes pulmonary embolism, congestive heart failure, chronic kidney disease, obesity and several others. Severity of symptoms is moderate to severe.  Past Medical History  Diagnosis Date  . Anxiety   . Arthritis   . COPD (chronic obstructive pulmonary disease) ([place])   . Depression   . Hypertension   . Meningitis   . Vertigo   . Colon cancer ([place])   . Peripheral vascular disease ([place])   . CKD (chronic kidney disease)     Stage III 03/2013  . Shortness of breath   . CHF (congestive heart failure) ([place])   . PE (pulmonary embolism)   . Coronary artery disease   . Sleep apnea   . Anemia    Past Surgical History  Procedure Laterality Date  . Cesarean section      X 3 [id number]  . Cholecystectomy    . Abdominal hysterectomy    . Diverticulosis surgery-2002  2012  . Colectomy  2010  . Cholecystectomy  1980/s  . Femoral-popliteal bypass graft Left 08/13/2013    Procedure: BYPASS GRAFT FEMORAL-POPLITEAL ARTERY WITH NON-REVERSED SAPHANEOUS VEIN; ULTRASOUND GUIDED;  Surgeon: Mal [name], MD;  Location: [place];  Service: Vascular;  Laterality: Left;  . Abdominal aortagram N/A 07/26/2013    Procedure: ABDOMINAL Maxcine Ham;  Surgeon: [name] Troy, MD;  Location: [facility] CATH LAB;  Service: Cardiovascular;  Laterality: N/A;  . Lower extremity angiogram Left 07/26/2013    Procedure: LOWER EXTREMITY ANGIOGRAM;  Surgeon: [name] Baneberry, MD;  Location: [facility] CATH LAB;  Service: Cardiovascular;  Laterality: Left;   Family History   Problem Relation Age of Onset  . Cancer Mother 22    breast and bone  . Cancer Father 80    prostate  . Cancer Cousin 20    breast cancer   . Hypertension Sister   . Bleeding Disorder Sister   . Hypertension Daughter    Social History  Substance Use Topics  . Smoking status: Former Smoker -- 1.50 packs/day for 40 years    Types: Cigarettes    Quit date: 08/09/2014  . Smokeless tobacco: Never Used  . Alcohol Use: No     Comment: she used to drink alcohol, quit in 2010    OB History    No data available     Review of Systems  All other systems reviewed and are negative.     Allergies  Iohexol  Home Medications   Prior to Admission medications   Medication Sig Start Date End Date Taking? Authorizing Provider  amLODipine (NORVASC) 10 MG tablet Take 10 mg by mouth daily.   Yes Historical Provider, MD  aspirin 81 MG tablet Take 81 mg by mouth daily.   Yes Historical Provider, MD  BYSTOLIC 10 MG tablet Take 10 mg by mouth daily. 08/07/15  Yes Historical Provider, MD  PROAIR RESPICLICK [id number] (90 BASE) MCG/ACT AEPB  09/30/15  Yes Historical Provider, MD   BP 169/77 mmHg  Pulse 76  Temp(Src) 99.8 F (37.7 C) (Oral)  Resp 14  SpO2  95% Physical Exam  Constitutional: She is oriented to person, place, and time.  Obese, no obvious dyspnea at rest  HENT:  Head: Normocephalic and atraumatic.  Eyes: Conjunctivae and EOM are normal. Pupils are equal, round, and reactive to light.  Neck: Normal range of motion. Neck supple.  Cardiovascular: Normal rate and regular rhythm.   Pulmonary/Chest: Effort normal and breath sounds normal.  Abdominal: Soft. Bowel sounds are normal.  Musculoskeletal: Normal range of motion.  Neurological: She is alert and oriented to person, place, and time.  Skin: Skin is warm and dry.  Psychiatric: She has a normal mood and affect. Her behavior is normal.  Nursing note and vitals reviewed.   ED Course  Procedures (including critical care time) Labs  Review Labs Reviewed  CBC - Abnormal; Notable for the following:    WBC 17.1 (*)    All other components within normal limits  BRAIN NATRIURETIC PEPTIDE - Abnormal; Notable for the following:    B Natriuretic Peptide 115.8 (*)    All other components within normal limits  COMPREHENSIVE METABOLIC PANEL - Abnormal; Notable for the following:    Chloride 114 (*)    CO2 17 (*)    Glucose, Bld 122 (*)    BUN 25 (*)    Creatinine, Ser 2.32 (*)    Albumin 2.6 (*)    GFR calc non Af Amer 22 (*)    GFR calc Af Amer 25 (*)    All other components within normal limits  LIPASE, BLOOD  I-STAT TROPOININ, ED  I-STAT TROPOININ, ED    Imaging Review Dg Chest 2 View  10/17/2015  CLINICAL DATA:  Chest pain for 3 days EXAM: CHEST  2 VIEW COMPARISON:  09/11/2014 FINDINGS: Borderline cardiomegaly. No acute infiltrate or pleural effusion. No pulmonary edema. Degenerative changes mid and lower thoracic spine. IMPRESSION: No active cardiopulmonary disease. Electronically Signed   By: Lahoma Crocker M.D.   On: 10/17/2015 16:46   Nm Pulmonary Perf And Vent  10/17/2015  CLINICAL DATA:  Shortness of Breath EXAM: NUCLEAR MEDICINE VENTILATION - PERFUSION LUNG SCAN TECHNIQUE: Ventilation images were obtained in multiple projections using inhaled aerosol Tc-50m DTPA. Perfusion images were obtained in multiple projections after intravenous injection of Tc-29m MAA. Lateral images of the lungs could not be obtained due to patient's inability to tolerate procedure. RADIOPHARMACEUTICALS:  30 mCi Technetium-27m DTPA aerosol inhalation and 3 mCi Technetium-34m MAA IV COMPARISON:  11/14/2013 FINDINGS: Ventilation: Somewhat limited due to lack of lateral imaging. Ventilation images show no significant defect. Perfusion: Perfusion images are also limited due the lack of lateral imaging. There is a wedge-shaped defect identified which projects in the left upper lobe posteriorly. IMPRESSION: Somewhat limited exam due to the lack of  lateral imaging. High probability scan for pulmonary embolus in the left lung as described. Electronically Signed   By: Inez Catalina M.D.   On: 10/17/2015 18:26   I have personally reviewed and evaluated these images and lab results as part of my medical decision-making.   EKG Interpretation   Date/Time:  Tuesday October 17 2015 15:19:51 EDT Ventricular Rate:  82 PR Interval:  183 QRS Duration: 71 QT Interval:  379 QTC Calculation: 443 R Axis:   -17 Text Interpretation:  Sinus rhythm Probable left atrial enlargement  Borderline left axis deviation No significant change since last tracing  Confirmed by FLOYD MD, DANIEL [id number]) on 10/17/2015 4:43:43 PM     CRITICAL CARE Performed by: Nat Christen Total critical care time: 30 Critical  care time was exclusive of separately billable procedures and treating other patients. Critical care was necessary to treat or prevent imminent or life-threatening deterioration. Critical care was time spent personally by me on the following activities: development of treatment plan with patient and/or surrogate as well as nursing, discussions with consultants, evaluation of patient's response to treatment, examination of patient, obtaining history from patient or surrogate, ordering and performing treatments and interventions, ordering and review of laboratory studies, ordering and review of radiographic studies, pulse oximetry and re-evaluation of patient's condition. MDM   Final diagnoses:  SOB (shortness of breath)  Other acute pulmonary embolism without acute cor pulmonale (HCC)    Patient is allergic to contrast dye. V/Q scan reveals high probability for pulmonary embolism in the left lung.  She is hemodynamically stable. Intravenous heparin ordered. Admit to general medicine.    Nat Christen, MD 10/17/15 [place], MD 10/17/15 [phone]

## 2015-10-17 NOTE — H&P (Signed)
[facility] History and Physical  Whitney Walker [id number] DOB: [birth year] DOA: 10/17/2015  Referring physician: EDP PCP: Maximino Greenland, MD   Chief Complaint: Chest pain   HPI: Whitney Walker is a 59 y.o. female who presents to the ED with c/o chest pain with radiation to back.  Located in left chest, symptoms onset 3 days ago.  There is associated SOB especially with exertion.  Patient does have a PMH of PE following a surgery in the past.  Review of Systems: No leg pain, colon cancer has been in remission for 8 years or so, Systems reviewed.  As above, otherwise negative  Past Medical History  Diagnosis Date  . Anxiety   . Arthritis   . COPD (chronic obstructive pulmonary disease) ([place])   . Depression   . Hypertension   . Meningitis   . Vertigo   . Colon cancer ([place])   . Peripheral vascular disease ([place])   . CKD (chronic kidney disease)     Stage III 03/2013  . Shortness of breath   . CHF (congestive heart failure) ([place])   . PE (pulmonary embolism)   . Coronary artery disease   . Sleep apnea   . Anemia    Past Surgical History  Procedure Laterality Date  . Cesarean section      X 3 [id number]  . Cholecystectomy    . Abdominal hysterectomy    . Diverticulosis surgery-2002  2012  . Colectomy  2010  . Cholecystectomy  1980/s  . Femoral-popliteal bypass graft Left 08/13/2013    Procedure: BYPASS GRAFT FEMORAL-POPLITEAL ARTERY WITH NON-REVERSED SAPHANEOUS VEIN; ULTRASOUND GUIDED;  Surgeon: Mal [name], MD;  Location: [place];  Service: Vascular;  Laterality: Left;  . Abdominal aortagram N/A 07/26/2013    Procedure: ABDOMINAL Maxcine Ham;  Surgeon: [name] Hallstead, MD;  Location: [facility] CATH LAB;  Service: Cardiovascular;  Laterality: N/A;  . Lower extremity angiogram Left 07/26/2013    Procedure: LOWER EXTREMITY ANGIOGRAM;  Surgeon: [name] , MD;  Location: [facility] CATH LAB;  Service: Cardiovascular;  Laterality: Left;   Social History:  reports that she quit smoking  about 14 months ago. Her smoking use included Cigarettes. She has a 60 pack-year smoking history. She has never used smokeless tobacco. She reports that she does not drink alcohol or use illicit drugs.  Allergies  Allergen Reactions  . Iohexol Other (See Comments)     Code: HIVES, Desc: HIVES W/ "DYE" USED FOR 1ST CT SCAN BUT NOT 2ND, NO PREMEDS USED, PT UNCERTAIN OF CIRCUMSTANCES,,?POSSIBLE MRI CONTRAST ALLERGY, ALL STUDIES DONE "SOMEWHERE" IN [place]//A[place] Onset Date: [id number]     Family History  Problem Relation Age of Onset  . Cancer Mother 53    breast and bone  . Cancer Father 58    prostate  . Cancer Cousin 20    breast cancer   . Hypertension Sister   . Bleeding Disorder Sister   . Hypertension Daughter      Prior to Admission medications   Medication Sig Start Date End Date Taking? Authorizing Provider  amLODipine (NORVASC) 10 MG tablet Take 10 mg by mouth daily.   Yes Historical Provider, MD  aspirin 81 MG tablet Take 81 mg by mouth daily.   Yes Historical Provider, MD  BYSTOLIC 10 MG tablet Take 10 mg by mouth daily. 08/07/15  Yes Historical Provider, MD  [place] [id number] (90 BASE) MCG/ACT AEPB  09/30/15  Yes Historical Provider, MD   Physical Exam: Filed Vitals:  10/17/15 1900  BP: 169/77  Pulse: 76  Temp:   Resp: 14    BP 169/77 mmHg  Pulse 76  Temp(Src) 99.8 F (37.7 C) (Oral)  Resp 14  SpO2 95%  General Appearance:    Alert, oriented, no distress, appears stated age  Head:    Normocephalic, atraumatic  Eyes:    PERRL, EOMI, sclera non-icteric        Nose:   Nares without drainage or epistaxis. Mucosa, turbinates normal  Throat:   Moist mucous membranes. Oropharynx without erythema or exudate.  Neck:   Supple. No carotid bruits.  No thyromegaly.  No lymphadenopathy.   Back:     No CVA tenderness, no spinal tenderness  Lungs:     Clear to auscultation bilaterally, without wheezes, rhonchi or rales  Chest wall:    No tenderness to palpitation   Heart:    Regular rate and rhythm without murmurs, gallops, rubs  Abdomen:     Soft, non-tender, nondistended, normal bowel sounds, no organomegaly  Genitalia:    deferred  Rectal:    deferred  Extremities:   No clubbing, cyanosis or edema.  Pulses:   2+ and symmetric all extremities  Skin:   Skin color, texture, turgor normal, no rashes or lesions  Lymph nodes:   Cervical, supraclavicular, and axillary nodes normal  Neurologic:   CNII-XII intact. Normal strength, sensation and reflexes      throughout    Labs on Admission:  Basic Metabolic Panel:  Recent Labs Lab 10/17/15 1600  NA 140  K 4.6  CL 114*  CO2 17*  GLUCOSE 122*  BUN 25*  CREATININE 2.32*  CALCIUM 10.0   Liver Function Tests:  Recent Labs Lab 10/17/15 1600  AST 27  ALT 21  ALKPHOS 91  BILITOT 0.6  PROT 8.0  ALBUMIN 2.6*    Recent Labs Lab 10/17/15 1550  LIPASE 22   No results for input(s): AMMONIA in the last 168 hours. CBC:  Recent Labs Lab 10/17/15 1550  WBC 17.1*  HGB 12.1  HCT 37.7  MCV 83.4  PLT 211   Cardiac Enzymes: No results for input(s): CKTOTAL, CKMB, CKMBINDEX, TROPONINI in the last 168 hours.  BNP (last 3 results) No results for input(s): PROBNP in the last 8760 hours. CBG: No results for input(s): GLUCAP in the last 168 hours.  Radiological Exams on Admission: Dg Chest 2 View  10/17/2015  CLINICAL DATA:  Chest pain for 3 days EXAM: CHEST  2 VIEW COMPARISON:  09/11/2014 FINDINGS: Borderline cardiomegaly. No acute infiltrate or pleural effusion. No pulmonary edema. Degenerative changes mid and lower thoracic spine. IMPRESSION: No active cardiopulmonary disease. Electronically Signed   By: Lahoma Crocker M.D.   On: 10/17/2015 16:46   Nm Pulmonary Perf And Vent  10/17/2015  CLINICAL DATA:  Shortness of Breath EXAM: NUCLEAR MEDICINE VENTILATION - PERFUSION LUNG SCAN TECHNIQUE: Ventilation images were obtained in multiple projections using inhaled aerosol Tc-62m DTPA. Perfusion  images were obtained in multiple projections after intravenous injection of Tc-51m MAA. Lateral images of the lungs could not be obtained due to patient's inability to tolerate procedure. RADIOPHARMACEUTICALS:  30 mCi Technetium-31m DTPA aerosol inhalation and 3 mCi Technetium-26m MAA IV COMPARISON:  11/14/2013 FINDINGS: Ventilation: Somewhat limited due to lack of lateral imaging. Ventilation images show no significant defect. Perfusion: Perfusion images are also limited due the lack of lateral imaging. There is a wedge-shaped defect identified which projects in the left upper lobe posteriorly. IMPRESSION: Somewhat limited exam due  to the lack of lateral imaging. High probability scan for pulmonary embolus in the left lung as described. Electronically Signed   By: Inez Catalina M.D.   On: 10/17/2015 18:26    EKG: Independently reviewed.  Assessment/Plan Principal Problem:   Pulmonary embolism (HCC) Active Problems:   CKD (chronic kidney disease) stage 4, GFR 15-29 ml/min (HCC)   1. PE - 1. Heparin gtt per pharm consult 2. Coumadin per pharm consult due to patients CKD stage 4 cannot use Xarelto 3. BLE venous duplex    Code Status: Full  Family Communication: No family in room Disposition Plan: Admit to inpatient   Time spent: 50 min  GARDNER, JARED M. [facility] Pager [phone]  If 7AM-7PM, please contact the day team taking care of the patient [email or web address] Password [email or web address] 10/17/2015, 7:45 PM

## 2015-10-17 NOTE — ED Notes (Signed)
Per flow room number changed. Attempted report to floor.

## 2015-10-17 NOTE — Progress Notes (Signed)
In processing H&P, pt states that she wishes to not have any breathing tubes or "stuff like that" done to her. Advised pt that she is a full code at this time and paged NP on call to have code status change conversation with pt.

## 2015-10-17 NOTE — ED Notes (Addendum)
Onset 3 days left chest pain, radiating to left back, left arm and left jaw and pain increased with deep breathing and movement.  Increase in shortness of breath.  Pt uses BiPap 4 hours out of day/night.  Pt was at breast center and shortness of breath and pain worsened. EMS gave Aspirin 324 mg en route.

## 2015-10-17 NOTE — ED Notes (Signed)
Attempted report to 2W07 per Desiree unable to take patient d/t chest pain.  Called flow to advise.

## 2015-10-17 NOTE — ED Notes (Signed)
Report attempted 

## 2015-10-17 NOTE — Progress Notes (Signed)
Per RN, Anderson Malta, when doing her assessment on pt and verifying her code status, pt stated she didn't want CPR or tubes or machines or anything should she "code". This NP discussed this with pt over the telephone. This NP reviewed with pt what CPR is and how this is what we do if her heart should stop or she stops breathing. This NP informed her that "machines" and such can be a different part of the decision process and she could still get CPR. After discussion, "she doesn't want anything done". I again verified this with her because of her age. She stated she was in the process of doing the paper work at home and her children know what she wants. Order changed to DNR.  Clance Boll, NP [facility]

## 2015-10-17 NOTE — Progress Notes (Addendum)
ANTICOAGULATION CONSULT NOTE - Initial Consult  Pharmacy Consult for heparin and warfarin Indication: PE  Patient Measurements: Weight: 241 lb (109.317 kg) Heparin Dosing Weight: 79 kg  Vital Signs: Temp: 99.8 F (37.7 C) (10/18 1525) Temp Source: Oral (10/18 1525) BP: 169/77 mmHg (10/18 1900) Pulse Rate: 76 (10/18 1900)  Labs:  Recent Labs  10/17/15 1550 10/17/15 1600  HGB 12.1  --   HCT 37.7  --   PLT 211  --   CREATININE  --  2.32*    Assessment: 59 yoF with PE, WBC 17.1, 12.1/37.7, PLTs 211, INR 1.17  Goal of Therapy:  INR 2-3 Heparin level 0.3-0.7 units/ml Monitor platelets by anticoagulation protocol: Yes   Plan:  Give 4500 units bolus x 1 Start heparin infusion at 1300 units/hr Check anti-Xa level in 8 hours and daily while on heparin Continue to monitor H&H and platelets  Warfarin 7.5 mg x 1 tonight Daily INR while inpatient   Vincenza Hews, PharmD, BCPS 10/17/2015, 8:52 PM Pager: [phone]

## 2015-10-18 ENCOUNTER — Ambulatory Visit (HOSPITAL_COMMUNITY): Payer: Medicare Other

## 2015-10-18 ENCOUNTER — Inpatient Hospital Stay (HOSPITAL_COMMUNITY): Payer: Medicare Other

## 2015-10-18 DIAGNOSIS — I5032 Chronic diastolic (congestive) heart failure: Secondary | ICD-10-CM

## 2015-10-18 DIAGNOSIS — I82412 Acute embolism and thrombosis of left femoral vein: Secondary | ICD-10-CM

## 2015-10-18 DIAGNOSIS — I82409 Acute embolism and thrombosis of unspecified deep veins of unspecified lower extremity: Secondary | ICD-10-CM | POA: Diagnosis present

## 2015-10-18 DIAGNOSIS — F32A Depression, unspecified: Secondary | ICD-10-CM | POA: Diagnosis present

## 2015-10-18 DIAGNOSIS — J449 Chronic obstructive pulmonary disease, unspecified: Secondary | ICD-10-CM

## 2015-10-18 DIAGNOSIS — F329 Major depressive disorder, single episode, unspecified: Secondary | ICD-10-CM | POA: Diagnosis present

## 2015-10-18 DIAGNOSIS — I2699 Other pulmonary embolism without acute cor pulmonale: Secondary | ICD-10-CM

## 2015-10-18 DIAGNOSIS — I10 Essential (primary) hypertension: Secondary | ICD-10-CM | POA: Diagnosis present

## 2015-10-18 LAB — CBC
HCT: 34.1 % — ABNORMAL LOW (ref 36.0–46.0)
Hemoglobin: 10.9 g/dL — ABNORMAL LOW (ref 12.0–15.0)
MCH: 27.2 pg (ref 26.0–34.0)
MCHC: 32 g/dL (ref 30.0–36.0)
MCV: 85 fL (ref 78.0–100.0)
Platelets: 192 10*3/uL (ref 150–400)
RBC: 4.01 MIL/uL (ref 3.87–5.11)
RDW: 14.2 % (ref 11.5–15.5)
WBC: 17.9 10*3/uL — ABNORMAL HIGH (ref 4.0–10.5)

## 2015-10-18 LAB — HEPARIN LEVEL (UNFRACTIONATED): Heparin Unfractionated: 0.44 IU/mL (ref 0.30–0.70)

## 2015-10-18 LAB — PROTIME-INR
INR: 1.17 (ref 0.00–1.49)
Prothrombin Time: 15.1 seconds (ref 11.6–15.2)

## 2015-10-18 IMAGING — CR DG ABD PORTABLE 1V
1 series · 1 of 1 positions shown · non-contrast
Comparison: None.

CLINICAL DATA: Vomiting tonight.

EXAM:
PORTABLE ABDOMEN - 1 VIEW

[AP]
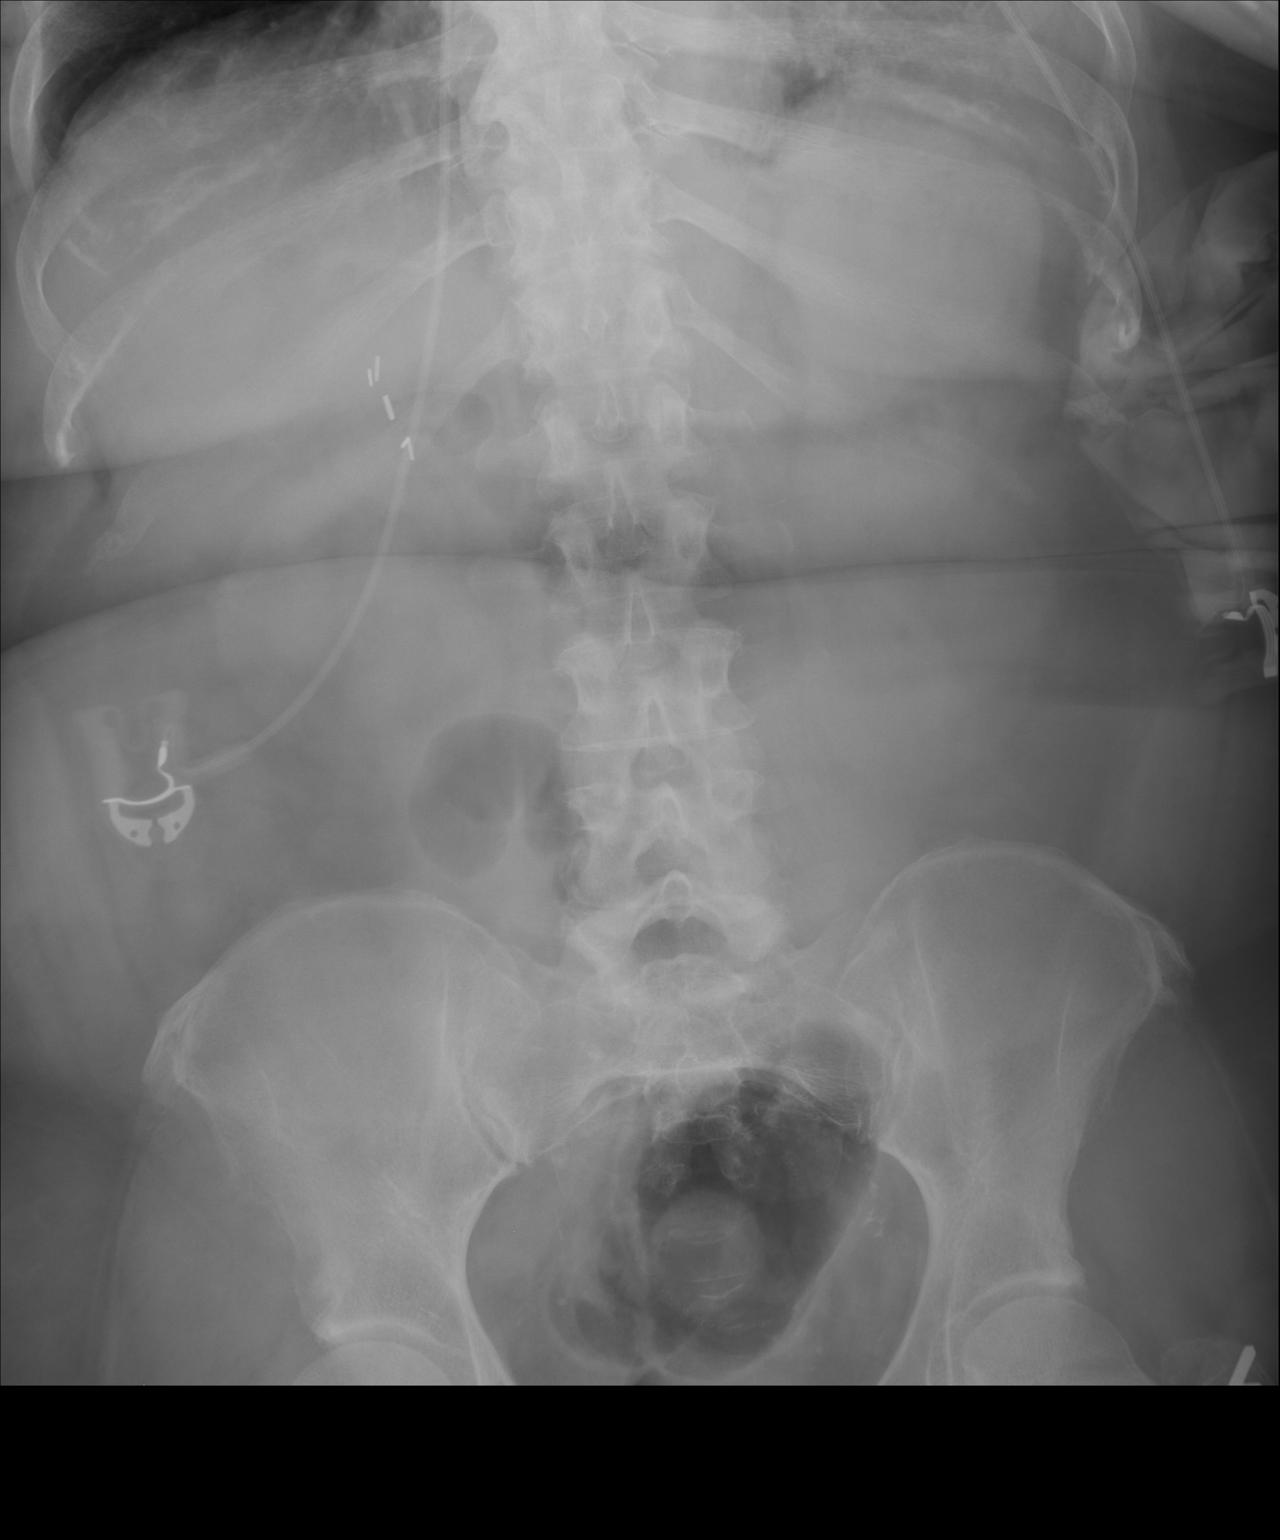

[1 of 1 positions shown; findings below may reference images not displayed]

FINDINGS: Relative paucity of bowel gas with air scattered throughout colon in
the central and lower abdomen. No air-filled dilated bowel loops to
suggest obstruction. Surgical clips in the right upper quadrant of
the abdomen. No abnormal soft tissue calcifications. Vascular
calcifications are seen.
IMPRESSION: Paucity of bowel gas, no air-filled dilated bowel loops are seen.

## 2015-10-18 MED ORDER — WARFARIN VIDEO: Freq: Once | Status: DC

## 2015-10-18 MED ORDER — IPRATROPIUM-ALBUTEROL 0.5-2.5 (3) MG/3ML IN SOLN: 3.0000 mL | Freq: Four times a day (QID) | RESPIRATORY_TRACT | Status: DC | PRN

## 2015-10-18 MED ORDER — PROMETHAZINE HCL 25 MG/ML IJ SOLN
12.5000 mg | Freq: Once | INTRAMUSCULAR | Status: AC
  Administered 2015-10-18: 12.5 mg via INTRAVENOUS
  Filled 2015-10-18: qty 1

## 2015-10-18 MED ORDER — COUMADIN BOOK
Freq: Once | Status: AC
  Administered 2015-10-18: 1
  Filled 2015-10-18 (×2): qty 1

## 2015-10-18 MED ORDER — WARFARIN SODIUM 7.5 MG PO TABS
7.5000 mg | ORAL_TABLET | Freq: Once | ORAL | Status: AC
  Administered 2015-10-18: 7.5 mg via ORAL
  Filled 2015-10-18: qty 1

## 2015-10-18 NOTE — Progress Notes (Signed)
UR Completed Royetta Probus Graves-Bigelow, RN,BSN [phone]  

## 2015-10-18 NOTE — Progress Notes (Signed)
NP Baltazar Najjar notified of vomit x2, 750 ml bile fluid. New orders received. Will continue to monitor closely.

## 2015-10-18 NOTE — Progress Notes (Addendum)
ANTICOAGULATION CONSULT NOTE - Follow-up Consult  Pharmacy Consult for heparin and warfarin Indication: PE  Patient Measurements: Height: 5\' 3"  (160 cm) Weight: 233 lb 11.2 oz (106.006 kg) IBW/kg (Calculated) : 52.4 Heparin Dosing Weight: 79 kg  Vital Signs: Temp: 98.8 F (37.1 C) (10/19 0500) Temp Source: Oral (10/19 0500) BP: 144/57 mmHg (10/19 0500) Pulse Rate: 68 (10/19 0500)  Labs:  Recent Labs  10/17/15 1550 10/17/15 1600 10/17/15 2036 10/18/15 0402 10/18/15 0450  HGB 12.1  --   --  10.9*  --   HCT 37.7  --   --  34.1*  --   PLT 211  --   --  192  --   LABPROT  --   --  15.1  --  15.1  INR  --   --  1.17  --  1.17  HEPARINUNFRC  --   --   --   --  0.44  CREATININE  --  2.32*  --   --   --     Assessment: 31 yoF with PE on heparin bridge to coumadin. INR 1.17 - no movement past first dose yesterday. Hgb down a bit. No bleeding noted. Heparin level therapeutic on 1300 units/hr.  Today is Day #2/minimum 5 day overlap.  Goal of Therapy:  INR 2-3 Heparin level 0.3-0.7 units/ml Monitor platelets by anticoagulation protocol: Yes   Plan:  Continue heparin at 1300 units/hr F/u 6 hr heparin level to confirm therapeutic Warfarin 7.5 mg x 1 again tonight Daily INR Coumadin educational booklet and video  Sherlon Handing, PharmD, BCPS Clinical pharmacist, pager [phone] 10/18/2015, 5:40 AM    10/18/2015 1:21 PM Patient is refusing heparin level ordered for noon.  Since AM level was therapeutic will not reorder at this time.  I have informed MD that the patient is refusing labs and if she continues to do so we will need to consider alternative anticoag therapy.  It is potentially unsafe for a patient to continue on heparin infusion without daily monitoring.  [employer], Pharm.D., BCPS Clinical Pharmacist Pager [phone] 10/18/2015 1:23 PM

## 2015-10-18 NOTE — Progress Notes (Signed)
   10/18/15 1515  Clinical Encounter Type  Visited With Patient;Health care provider  Visit Type Initial  Referral From Nurse  Spiritual Encounters  Spiritual Needs Emotional  Stress Factors  Patient Stress Factors Exhausted;Health changes   Chaplain met with the patient and facilitated a bit of life review and spiritual assessment. Patient indicated that she was having quite a bit of pain while breathing, that she has a lot on her plate and is stressed out, that she's had very little energy, and that she was not suicidal. Chaplain offered support and our support is available as needed.   Jeri Lager, Chaplain 10/18/2015 3:18 PM

## 2015-10-18 NOTE — Progress Notes (Signed)
Pt c/o not being able to breathe due to pain. VS obtained and charted. O2 applied at 2L via nasal cannula. This RN offered PRN pain med dilaudid. Pt at first refused saying she did not want to vomit. This RN offered to contact MD for alternative pain meds and pt refused for this RN to do it. This RN offered anti-emetic, pt refused. Pt states she did not want to be a "[place] pig". Pt asked for the dilaudid from this RN at the end of the conversation. Of note, pt vomited within the hour after receiving the PRN dilaudid administered. Report given to day RN and advised that possibly dilaudid should be added to patients allergy/intolerance list.

## 2015-10-18 NOTE — Progress Notes (Signed)
Pt stated she has daily thoughts of harming herself due to her "bad depression". Charge RN informed, room cleared, pt wanded by security, suicide sitter assigned to room. Will continue to monitor closely.

## 2015-10-18 NOTE — Progress Notes (Signed)
PROGRESS NOTE  Whitney Walker [id number] DOB: 10-06-1956 DOA: 10/17/2015 PCP: Maximino Greenland, MD   HPI: [name] is a 59 y/o female with PMH of CKD Stage 4, chronic diastolic heart failure, HTN, cirrhosis, CAD, PAD, depression and prior PE admitted 10/17 after presenting to the ED with chest pain and SOB that had been ongoing x 3 days. VQ scan at that time showed high probability of PE in the left lung. She has prior history of pulmonary embolism in 2014 following a surgery and was placed on Xarelto on that time. She has not been on chronic anticoagulation medications for several months now. Lower extremity duplex performed 10/19 with DVT in Left CFV.    Subjective / 24 H Interval events - Had some nausea/vomiting overnight but otherwise stable  - Patient states that she is still having chest pain and SOB today which has not improved compared to yesterday  - She also states that she is depressed and sometimes has suicidal thoughts but says "It's not like I have a plan or anything".  - patient spoke with NP overnight and requested to be made a DNR, code status was changed   Assessment/Plan: Principal Problem:   Pulmonary embolism ([place]) Active Problems:   Leucocytosis   CKD (chronic kidney disease) stage 4, GFR 15-29 ml/min (HCC)   Chronic diastolic heart failure (HCC)   HTN (hypertension)   DVT (deep venous thrombosis) (HCC)  Pulmonary Embolism and LLE DVT - Patient presented to ED 10/18 with complaints of SOB and chest pain. V/Q scan at that time revealed high probability for pulmonary embolism in the left lung. - patient with prior PE in 2014 for which she was anticoagulated with Xarelto for a year, now off of anticoagulation  - Currently on heparin bridge to coumadin due to CKD. However per pharmacy patient is a candidate for Xarelto based on creatinine clearance due to her weight. Will monitor renal function in am and if Cr improving will likely need to switch to Xarelto -  Bilateral LE duplex performed today positive DVT in L CFV - Continue Heparin per pharmacy, pain meds PRN   Chronic Kidney Disease Stage 4 - Upon admission Cr - 2.32, patient baseline 1.7-1.9  - monitor  Depression  - Patient has history of depression, not currently on any antidepressant medications  - She told night nursing staff that she feels depressed and has daily thoughts of hurting herself and confirmed the same to me this morning stating "I have thoughts but its not like I have a plan or anything"  - psychiatry consulted this morning, appreciate input   HTN  - BP stable 130-140s since admission  - Continue Amlodipine, Bystolic   Chronic Diastolic Heart Failure - Last echo performed 10/2013 - EF 65-70% with Grade I diastolic dysfunction  - appears euvolemic  COPD - stable, no wheezing, albuterol prn   Diet: Diet Heart Room service appropriate?: Yes; Fluid consistency:: Thin Fluids: None  DVT Prophylaxis: Anticoagulation with heparin   Code Status: DNR Family Communication: No family present at bedside   Disposition Plan: TBD  Barriers to discharge: IV heparin  Consultants:  Psychiatry  Procedures:  None   Antibiotics:  None  Anti-infectives    None       Studies  Dg Chest 2 View  10/17/2015  CLINICAL DATA:  Chest pain for 3 days EXAM: CHEST  2 VIEW COMPARISON:  09/11/2014 FINDINGS: Borderline cardiomegaly. No acute infiltrate or pleural effusion. No pulmonary edema. Degenerative changes  mid and lower thoracic spine. IMPRESSION: No active cardiopulmonary disease. Electronically Signed   By: Lahoma Crocker M.D.   On: 10/17/2015 16:46   Nm Pulmonary Perf And Vent  10/17/2015  CLINICAL DATA:  Shortness of Breath EXAM: NUCLEAR MEDICINE VENTILATION - PERFUSION LUNG SCAN TECHNIQUE: Ventilation images were obtained in multiple projections using inhaled aerosol Tc-22m DTPA. Perfusion images were obtained in multiple projections after intravenous injection of Tc-29m  MAA. Lateral images of the lungs could not be obtained due to patient's inability to tolerate procedure. RADIOPHARMACEUTICALS:  30 mCi Technetium-62m DTPA aerosol inhalation and 3 mCi Technetium-30m MAA IV COMPARISON:  11/14/2013 FINDINGS: Ventilation: Somewhat limited due to lack of lateral imaging. Ventilation images show no significant defect. Perfusion: Perfusion images are also limited due the lack of lateral imaging. There is a wedge-shaped defect identified which projects in the left upper lobe posteriorly. IMPRESSION: Somewhat limited exam due to the lack of lateral imaging. High probability scan for pulmonary embolus in the left lung as described. Electronically Signed   By: Inez Catalina M.D.   On: 10/17/2015 18:26   Dg Abd Portable 1v  10/18/2015  CLINICAL DATA:  Vomiting tonight. EXAM: PORTABLE ABDOMEN - 1 VIEW COMPARISON:  None. FINDINGS: Relative paucity of bowel gas with air scattered throughout colon in the central and lower abdomen. No air-filled dilated bowel loops to suggest obstruction. Surgical clips in the right upper quadrant of the abdomen. No abnormal soft tissue calcifications. Vascular calcifications are seen. IMPRESSION: Paucity of bowel gas, no air-filled dilated bowel loops are seen. Electronically Signed   By: Jeb Levering M.D.   On: 10/18/2015 00:47    Objective  Filed Vitals:   10/17/15 2141 10/18/15 0500 10/18/15 0623 10/18/15 1001  BP: 141/70 144/57 132/79 132/79  Pulse: 74 68 66   Temp: 98.6 F (37 C) 98.8 F (37.1 C) 97.9 F (36.6 C)   TempSrc: Oral Oral Oral   Resp: 16 16 16    Height: 5\' 3"  (1.6 m) 5\' 3"  (1.6 m)    Weight: 105.733 kg (233 lb 1.6 oz) 106.006 kg (233 lb 11.2 oz)    SpO2: 95% 90% 91%     Intake/Output Summary (Last 24 hours) at 10/18/15 1034 Last data filed at 10/18/15 1000  Gross per 24 hour  Intake    723 ml  Output    925 ml  Net   -202 ml   Filed Weights   10/17/15 1917 10/17/15 2141 10/18/15 0500  Weight: 109.317 kg (241 lb)  105.733 kg (233 lb 1.6 oz) 106.006 kg (233 lb 11.2 oz)    Exam:  GENERAL: NAD, sitting on side of bed   HEENT: head NCAT, no scleral icterus. Mucous membranes are moist.   NECK: Supple. No LAD  LUNGS: Breath sounds equal with mild rhonchi bilaterally.  HEART: Regular rate and rhythm without murmur. 2+ pulses, no JVD. Bilateral LE edema, worse on the left.   ABDOMEN: Soft, non-distended, non-tender. Positive bowel sounds.  EXTREMITIES: Without any cyanosis or clubbing. Good muscle tone.   NEUROLOGIC: No focal neurologic deficits   PSYCHIATRIC: Depressed mood and affect. Suicidal ideation without plan or intent.  Data Reviewed: Basic Metabolic Panel:  Recent Labs Lab 10/17/15 1600  NA 140  K 4.6  CL 114*  CO2 17*  GLUCOSE 122*  BUN 25*  CREATININE 2.32*  CALCIUM 10.0   Liver Function Tests:  Recent Labs Lab 10/17/15 1600  AST 27  ALT 21  ALKPHOS 91  BILITOT 0.6  PROT 8.0  ALBUMIN 2.6*    Recent Labs Lab 10/17/15 1550  LIPASE 22   CBC:  Recent Labs Lab 10/17/15 1550 10/18/15 0402  WBC 17.1* 17.9*  HGB 12.1 10.9*  HCT 37.7 34.1*  MCV 83.4 85.0  PLT 211 192   Cardiac Enzymes:  BNP (last 3 results)  Recent Labs  10/17/15 1600  BNP 115.8*    ProBNP (last 3 results)  CBG:   Scheduled Meds: . amLODipine  10 mg Oral Daily  . aspirin  324 mg Oral Once  . coumadin book   Does not apply Once  .  morphine injection  4 mg Intravenous Once  . nebivolol  10 mg Oral Daily  . sodium chloride  3 mL Intravenous Q12H  . warfarin  7.5 mg Oral ONCE-1800  . warfarin   Does not apply Once  . Warfarin - Pharmacist Dosing Inpatient   Does not apply q1800   Continuous Infusions: . heparin 1,300 Units/hr (10/17/15 2038)    Katie Vornheder, PA-S   [place] M. Cruzita Lederer, [place] 10/18/2015, 10:34 AM  LOS: 1 day

## 2015-10-18 NOTE — Progress Notes (Signed)
VASCULAR LAB PRELIMINARY  PRELIMINARY  PRELIMINARY  PRELIMINARY  Bilateral lower extremity venous duplex  completed.    Preliminary report:  Right:  No evidence of DVT, superficial thrombosis, or Baker's cyst.  Left: DVT noted in the CFV.  No evidence of superficial thrombosis.  No Baker's cyst.   Gerardine Peltz, RVT 10/18/2015, 11:00 AM

## 2015-10-19 ENCOUNTER — Inpatient Hospital Stay (HOSPITAL_COMMUNITY): Payer: Medicare Other

## 2015-10-19 DIAGNOSIS — F332 Major depressive disorder, recurrent severe without psychotic features: Secondary | ICD-10-CM

## 2015-10-19 DIAGNOSIS — D72829 Elevated white blood cell count, unspecified: Secondary | ICD-10-CM

## 2015-10-19 DIAGNOSIS — F329 Major depressive disorder, single episode, unspecified: Secondary | ICD-10-CM

## 2015-10-19 LAB — URINALYSIS, ROUTINE W REFLEX MICROSCOPIC
Bilirubin Urine: NEGATIVE
Glucose, UA: NEGATIVE mg/dL
Hgb urine dipstick: NEGATIVE
Ketones, ur: NEGATIVE mg/dL
Leukocytes, UA: NEGATIVE
Nitrite: NEGATIVE
Protein, ur: >300 mg/dL — AB
Specific Gravity, Urine: 1.016 (ref 1.005–1.030)
Urobilinogen, UA: 0.2 mg/dL (ref 0.0–1.0)
pH: 5 (ref 5.0–8.0)

## 2015-10-19 LAB — BASIC METABOLIC PANEL
Anion gap: 9 (ref 5–15)
BUN: 47 mg/dL — ABNORMAL HIGH (ref 6–20)
CO2: 21 mmol/L — ABNORMAL LOW (ref 22–32)
Calcium: 9.4 mg/dL (ref 8.9–10.3)
Chloride: 102 mmol/L (ref 101–111)
Creatinine, Ser: 3.93 mg/dL — ABNORMAL HIGH (ref 0.44–1.00)
GFR calc Af Amer: 13 mL/min — ABNORMAL LOW (ref >60)
GFR calc non Af Amer: 11 mL/min — ABNORMAL LOW (ref >60)
Glucose, Bld: 140 mg/dL — ABNORMAL HIGH (ref 65–99)
Potassium: 4.4 mmol/L (ref 3.5–5.1)
Sodium: 132 mmol/L — ABNORMAL LOW (ref 135–145)

## 2015-10-19 LAB — CBC
HCT: 35.6 % — ABNORMAL LOW (ref 36.0–46.0)
Hemoglobin: 11 g/dL — ABNORMAL LOW (ref 12.0–15.0)
MCH: 26.5 pg (ref 26.0–34.0)
MCHC: 30.9 g/dL (ref 30.0–36.0)
MCV: 85.8 fL (ref 78.0–100.0)
Platelets: 249 10*3/uL (ref 150–400)
RBC: 4.15 MIL/uL (ref 3.87–5.11)
RDW: 14.5 % (ref 11.5–15.5)
WBC: 16.3 10*3/uL — ABNORMAL HIGH (ref 4.0–10.5)

## 2015-10-19 LAB — URINE MICROSCOPIC-ADD ON

## 2015-10-19 LAB — PROTIME-INR
INR: 1.11 (ref 0.00–1.49)
Prothrombin Time: 14.5 seconds (ref 11.6–15.2)

## 2015-10-19 LAB — OSMOLALITY, URINE: Osmolality, Ur: 351 mOsm/kg — ABNORMAL LOW (ref 390–1090)

## 2015-10-19 LAB — HEPARIN LEVEL (UNFRACTIONATED): Heparin Unfractionated: <0.1 IU/mL — ABNORMAL LOW (ref 0.30–0.70)

## 2015-10-19 LAB — CREATININE, URINE, RANDOM: Creatinine, Urine: 281.15 mg/dL

## 2015-10-19 LAB — SODIUM, URINE, RANDOM: Sodium, Ur: <10 mmol/L

## 2015-10-19 MED ORDER — HEPARIN BOLUS VIA INFUSION
2000.0000 [IU] | Freq: Once | INTRAVENOUS | Status: AC
  Administered 2015-10-19: 2000 [IU] via INTRAVENOUS
  Filled 2015-10-19: qty 2000

## 2015-10-19 MED ORDER — WARFARIN SODIUM 7.5 MG PO TABS
7.5000 mg | ORAL_TABLET | Freq: Once | ORAL | Status: AC
  Administered 2015-10-19: 7.5 mg via ORAL
  Filled 2015-10-19: qty 1

## 2015-10-19 MED ORDER — VENLAFAXINE HCL ER 37.5 MG PO CP24
37.5000 mg | ORAL_CAPSULE | Freq: Every day | ORAL | Status: DC
  Administered 2015-10-19 – 2015-10-25 (×7): 37.5 mg via ORAL
  Filled 2015-10-19 (×9): qty 1

## 2015-10-19 NOTE — Progress Notes (Signed)
Patient is now refusing her lab draws, Called and updated Dr. Cruzita Lederer about patients refusal. No new orders at this time, will continue to monitor.

## 2015-10-19 NOTE — Progress Notes (Signed)
[place] for heparin and warfarin Indication: PE  Patient Measurements: Height: 5\' 3"  (160 cm) Weight: 233 lb 11.2 oz (106.006 kg) IBW/kg (Calculated) : 52.4 Heparin Dosing Weight: 79 kg  Vital Signs: Temp: 98.4 F (36.9 C) (10/20 0552) Temp Source: Oral (10/20 0552) BP: 138/71 mmHg (10/20 0552) Pulse Rate: 72 (10/20 0552)  Labs:  Recent Labs  10/17/15 1550 10/17/15 1600 10/17/15 2036 10/18/15 0402 10/18/15 0450 10/19/15 0359  HGB 12.1  --   --  10.9*  --  11.0*  HCT 37.7  --   --  34.1*  --  35.6*  PLT 211  --   --  192  --  249  LABPROT  --   --  15.1  --  15.1 14.5  INR  --   --  1.17  --  1.17 1.11  HEPARINUNFRC  --   --   --   --  0.44 <0.10*  CREATININE  --  2.32*  --   --   --  3.93*    Assessment: 39 yoF with PE on heparin bridge to coumadin.    Goal of Therapy:  INR 2-3 Heparin level 0.3-0.7 units/ml Monitor platelets by anticoagulation protocol: Yes   Plan:  Heparin 2000 units IV bolus, then increase heparin  1500 units/hr Check heparin level in 8 hours.  Coumadin 7.5 mg today  Phillis Knack, PharmD, BCPS

## 2015-10-19 NOTE — Consult Note (Signed)
[facility] Consult   Reason for Consult:  Depression medication management Referring Physician:  Dr. Cruzita Lederer Patient Identification: DEMAYA Walker MRN:  [id number] Principal Diagnosis: MDD (major depressive disorder), recurrent severe, without psychosis ([place]) Diagnosis:   Patient Active Problem List   Diagnosis Date Noted  . MDD (major depressive disorder), recurrent severe, without psychosis ([place]) [F33.2] 10/19/2015  . HTN (hypertension) [I10] 10/18/2015  . DVT (deep venous thrombosis) ([place]) [I82.409] 10/18/2015  . Depression [F32.9] 10/18/2015  . Pain, joint, lower leg, left [M25.562] 01/24/2015  . Numbness and tingling of left arm and leg [R20.2] 01/24/2015  . Aftercare following surgery of the circulatory system, [place] [Z48.812] 07/19/2014  . PVD (peripheral vascular disease) with claudication ([place]) [I73.9] 03/23/2014  . Swelling of limb-Left leg [M79.89] 03/23/2014  . Weakness-Left arm/Leg [R53.1] 03/23/2014  . PAD (peripheral artery disease) ([place]) [I73.9] 12/14/2013  . CKD (chronic kidney disease) stage 4, GFR 15-29 ml/min (HCC) [N18.4] 11/15/2013  . Chronic diastolic heart failure ([place]) [I50.32] 11/15/2013  . Unspecified essential hypertension [I10] 11/15/2013  . Pulmonary embolism ([place]) [I26.99] 11/14/2013  . Pain in limb [M79.609] 07/30/2013  . Atherosclerosis of native arteries of the extremities with ulceration(440.23) [I73.9, L98.499] 07/30/2013  . Cirrhosis ([place]) [M46.80] 05/21/2012  . Leucocytosis [D72.829] 04/28/2012  . Colon cancer ([place]) [C18.9] 04/28/2012    Total Time spent with patient: 1 hour  Subjective:   Whitney Walker is a 59 y.o. female patient admitted with chest pain and SOB.  HPI:  Whitney Walker is a 59 y/o female seen, chart reviewed for face-to-face psychiatry consultation and evaluation of increased symptoms of depression, loss of interest, decreased energy, disturbance of sleep and appetite, sleep apnea and plenty of other  medical problems and passive suicidal ideation. Patient reported she has been living by herself and has few friends and family members and contact with her. Patient reported she has a lot of stresses on her plate and has nobody to talk to. Patient reportedly received a psychiatric medication management about 2 years ago from primary care physician and currently has no known antidepressant medication treatment or outpatient psychiatric services. Patient is willing to give a trial of antidepressant medication and also follow-up with outpatient medication management and counseling services when medically stable from the hospital. Patient stated that she has passive suicidal ideation when she is thinking about all her stressors and family members and trauma also one of her cousin was monitored in front of her daughter about 7 years ago in [place]. Patient has no active suicidal ideation, intention or plans and contract for safety while in the hospital.  Medical history: Patient with PMH of CKD Stage 4, chronic diastolic heart failure, HTN, cirrhosis, CAD, PAD, depression and prior PE admitted 10/17 after presenting to the ED with chest pain and SOB that had been ongoing x 3 days. VQ scan at that time showed high probability of PE in the left lung. She has prior history of pulmonary embolism in 2014 following a surgery and was placed on Xarelto on that time. She has not been on chronic anticoagulation medications for several months now. Lower extremity duplex performed 10/19 with DVT in Left CFV.   Past Psychiatric History:  Patient has been diagnosed with major depressive disorder, recurrent without psychotic features and was on medication zoloft, celexa and lexapro from a PCP but not helpful. She has no current out patient treatment. Patient reportedly was treated inpatient psychiatric hospitalization when living in [place].   Risk to Self: Is patient  at risk for suicide?: Yes Risk to Others:   Prior  Inpatient Therapy:   Prior Outpatient Therapy:    Past Medical History:  Past Medical History  Diagnosis Date  . Anxiety   . Arthritis   . COPD (chronic obstructive pulmonary disease) ([place])   . Depression   . Hypertension   . Meningitis   . Vertigo   . Colon cancer ([place])   . Peripheral vascular disease ([place])   . CKD (chronic kidney disease)     Stage III 03/2013  . Shortness of breath   . CHF (congestive heart failure) ([place])   . PE (pulmonary embolism)   . Coronary artery disease   . Sleep apnea   . Anemia     Past Surgical History  Procedure Laterality Date  . Cesarean section      X 3 [id number]  . Cholecystectomy    . Abdominal hysterectomy    . Diverticulosis surgery-2002  2012  . Colectomy  2010  . Cholecystectomy  1980/s  . Femoral-popliteal bypass graft Left 08/13/2013    Procedure: BYPASS GRAFT FEMORAL-POPLITEAL ARTERY WITH NON-REVERSED SAPHANEOUS VEIN; ULTRASOUND GUIDED;  Surgeon: Mal [name], MD;  Location: [facility];  Service: Vascular;  Laterality: Left;  . Abdominal aortagram N/A 07/26/2013    Procedure: ABDOMINAL Maxcine Ham;  Surgeon: [name] Ponce, MD;  Location: [facility] CATH LAB;  Service: Cardiovascular;  Laterality: N/A;  . Lower extremity angiogram Left 07/26/2013    Procedure: LOWER EXTREMITY ANGIOGRAM;  Surgeon: [name] Opal, MD;  Location: [facility] CATH LAB;  Service: Cardiovascular;  Laterality: Left;   Family History:  Family History  Problem Relation Age of Onset  . Cancer Mother 24    breast and bone  . Cancer Father 71    prostate  . Cancer Cousin 20    breast cancer   . Hypertension Sister   . Bleeding Disorder Sister   . Hypertension Daughter    Family Psychiatric  History: Patient daughter has been suffering with severe mental illness and was admitted to inpatient psychiatric hospital.  Social History:  History  Alcohol Use No    Comment: she used to drink alcohol, quit in 2010      History  Drug Use No    Social History   Social History  .  Marital Status: Single    Spouse Name: N/A  . Number of Children: 4  . Years of Education: 12th gd   Occupational History  . N/A    Social History Main Topics  . Smoking status: Former Smoker -- 1.50 packs/day for 40 years    Types: Cigarettes    Quit date: 08/09/2014  . Smokeless tobacco: Never Used  . Alcohol Use: No     Comment: she used to drink alcohol, quit in 2010   . Drug Use: No  . Sexual Activity: Not Asked   Other Topics Concern  . None   Social History Narrative   2 cups of coffee a day    Additional Social History:                          Allergies:   Allergies  Allergen Reactions  . Iohexol Other (See Comments)     Code: HIVES, Desc: HIVES W/ "DYE" USED FOR 1ST CT SCAN BUT NOT 2ND, NO PREMEDS USED, PT UNCERTAIN OF CIRCUMSTANCES,,?POSSIBLE MRI CONTRAST ALLERGY, ALL STUDIES DONE "SOMEWHERE" IN [place]//A.C., Onset Date: [id number]     Labs:  Results for orders placed or performed during the hospital encounter of 10/17/15 (from the past 48 hour(s))  CBC     Status: Abnormal   Collection Time: 10/17/15  3:50 PM  Result Value Ref Range   WBC 17.1 (H) 4.0 - 10.5 K/uL   RBC 4.52 3.87 - 5.11 MIL/uL   Hemoglobin 12.1 12.0 - 15.0 g/dL   HCT 37.7 36.0 - 46.0 %   MCV 83.4 78.0 - 100.0 fL   MCH 26.8 26.0 - 34.0 pg   MCHC 32.1 30.0 - 36.0 g/dL   RDW 13.9 11.5 - 15.5 %   Platelets 211 150 - 400 K/uL  Lipase, blood     Status: None   Collection Time: 10/17/15  3:50 PM  Result Value Ref Range   Lipase 22 22 - 51 U/L  I-stat troponin, ED (not at [facility])     Status: None   Collection Time: 10/17/15  3:58 PM  Result Value Ref Range   Troponin i, poc 0.02 0.00 - 0.08 ng/mL   Comment 3            Comment: Due to the release kinetics of cTnI, a negative result within the first hours of the onset of symptoms does not rule out myocardial infarction with certainty. If myocardial infarction is still suspected, repeat the test at appropriate intervals.    Brain natriuretic peptide (order if patient c/o SOB ONLY)     Status: Abnormal   Collection Time: 10/17/15  4:00 PM  Result Value Ref Range   B Natriuretic Peptide 115.8 (H) 0.0 - 100.0 pg/mL  Comprehensive metabolic panel     Status: Abnormal   Collection Time: 10/17/15  4:00 PM  Result Value Ref Range   Sodium 140 135 - 145 mmol/L   Potassium 4.6 3.5 - 5.1 mmol/L   Chloride 114 (H) 101 - 111 mmol/L   CO2 17 (L) 22 - 32 mmol/L   Glucose, Bld 122 (H) 65 - 99 mg/dL   BUN 25 (H) 6 - 20 mg/dL   Creatinine, Ser 2.32 (H) 0.44 - 1.00 mg/dL   Calcium 10.0 8.9 - 10.3 mg/dL   Total Protein 8.0 6.5 - 8.1 g/dL   Albumin 2.6 (L) 3.5 - 5.0 g/dL   AST 27 15 - 41 U/L   ALT 21 14 - 54 U/L   Alkaline Phosphatase 91 38 - 126 U/L   Total Bilirubin 0.6 0.3 - 1.2 mg/dL   GFR calc non Af Amer 22 (L) >60 mL/min   GFR calc Af Amer 25 (L) >60 mL/min    Comment: (NOTE) The eGFR has been calculated using the CKD EPI equation. This calculation has not been validated in all clinical situations. eGFR's persistently <60 mL/min signify possible Chronic Kidney Disease.    Anion gap 9 5 - 15  Protime-INR     Status: None   Collection Time: 10/17/15  8:36 PM  Result Value Ref Range   Prothrombin Time 15.1 11.6 - 15.2 seconds   INR 1.17 0.00 - 1.49  I-stat troponin, ED (not at [facility])     Status: None   Collection Time: 10/17/15  8:46 PM  Result Value Ref Range   Troponin i, poc 0.01 0.00 - 0.08 ng/mL   Comment 3            Comment: Due to the release kinetics of cTnI, a negative result within the first hours of the onset of symptoms does not rule out myocardial infarction with certainty.  If myocardial infarction is still suspected, repeat the test at appropriate intervals.   CBC     Status: Abnormal   Collection Time: 10/18/15  4:02 AM  Result Value Ref Range   WBC 17.9 (H) 4.0 - 10.5 K/uL   RBC 4.01 3.87 - 5.11 MIL/uL   Hemoglobin 10.9 (L) 12.0 - 15.0 g/dL   HCT 34.1 (L) 36.0 - 46.0 %   MCV 85.0  78.0 - 100.0 fL   MCH 27.2 26.0 - 34.0 pg   MCHC 32.0 30.0 - 36.0 g/dL   RDW 14.2 11.5 - 15.5 %   Platelets 192 150 - 400 K/uL  Heparin level (unfractionated)     Status: None   Collection Time: 10/18/15  4:50 AM  Result Value Ref Range   Heparin Unfractionated 0.44 0.30 - 0.70 IU/mL    Comment:        IF HEPARIN RESULTS ARE BELOW EXPECTED VALUES, AND PATIENT DOSAGE HAS BEEN CONFIRMED, SUGGEST FOLLOW UP TESTING OF ANTITHROMBIN III LEVELS.   Protime-INR     Status: None   Collection Time: 10/18/15  4:50 AM  Result Value Ref Range   Prothrombin Time 15.1 11.6 - 15.2 seconds   INR 1.17 0.00 - 1.49  Protime-INR     Status: None   Collection Time: 10/19/15  3:59 AM  Result Value Ref Range   Prothrombin Time 14.5 11.6 - 15.2 seconds   INR 1.11 0.00 - 1.49  CBC     Status: Abnormal   Collection Time: 10/19/15  3:59 AM  Result Value Ref Range   WBC 16.3 (H) 4.0 - 10.5 K/uL   RBC 4.15 3.87 - 5.11 MIL/uL   Hemoglobin 11.0 (L) 12.0 - 15.0 g/dL   HCT 35.6 (L) 36.0 - 46.0 %   MCV 85.8 78.0 - 100.0 fL   MCH 26.5 26.0 - 34.0 pg   MCHC 30.9 30.0 - 36.0 g/dL   RDW 14.5 11.5 - 15.5 %   Platelets 249 150 - 400 K/uL  Basic metabolic panel     Status: Abnormal   Collection Time: 10/19/15  3:59 AM  Result Value Ref Range   Sodium 132 (L) 135 - 145 mmol/L    Comment: DELTA CHECK NOTED   Potassium 4.4 3.5 - 5.1 mmol/L   Chloride 102 101 - 111 mmol/L   CO2 21 (L) 22 - 32 mmol/L   Glucose, Bld 140 (H) 65 - 99 mg/dL   BUN 47 (H) 6 - 20 mg/dL   Creatinine, Ser 3.93 (H) 0.44 - 1.00 mg/dL    Comment: DELTA CHECK NOTED   Calcium 9.4 8.9 - 10.3 mg/dL   GFR calc non Af Amer 11 (L) >60 mL/min   GFR calc Af Amer 13 (L) >60 mL/min    Comment: (NOTE) The eGFR has been calculated using the CKD EPI equation. This calculation has not been validated in all clinical situations. eGFR's persistently <60 mL/min signify possible Chronic Kidney Disease.    Anion gap 9 5 - 15  Heparin level  (unfractionated)     Status: Abnormal   Collection Time: 10/19/15  3:59 AM  Result Value Ref Range   Heparin Unfractionated <0.10 (L) 0.30 - 0.70 IU/mL    Comment:        IF HEPARIN RESULTS ARE BELOW EXPECTED VALUES, AND PATIENT DOSAGE HAS BEEN CONFIRMED, SUGGEST FOLLOW UP TESTING OF ANTITHROMBIN III LEVELS.     Current Facility-Administered Medications  Medication Dose Route Frequency Provider Last Rate Last Dose  .  acetaminophen (TYLENOL) tablet 650 mg  650 mg Oral Q6H PRN Gardiner Barefoot, NP      . amLODipine (NORVASC) tablet 10 mg  10 mg Oral Daily Etta Quill, DO   10 mg at 10/19/15 1001  . aspirin chewable tablet 324 mg  324 mg Oral Once Deno Etienne, DO   324 mg at 10/17/15 1559  . heparin ADULT infusion 100 units/mL (25000 units/250 mL)  1,500 Units/hr Intravenous Continuous Caren Griffins, MD 15 mL/hr at 10/19/15 1000 1,500 Units/hr at 10/19/15 1000  . HYDROmorphone (DILAUDID) injection 1 mg  1 mg Intravenous Q3H PRN Gardiner Barefoot, NP   1 mg at 10/19/15 0529  . ipratropium-albuterol (DUONEB) 0.5-2.5 (3) MG/3ML nebulizer solution 3 mL  3 mL Nebulization Q6H PRN Costin Karlyne Greenspan, MD      . morphine 4 MG/ML injection 4 mg  4 mg Intravenous Once Deno Etienne, DO      . nebivolol (BYSTOLIC) tablet 10 mg  10 mg Oral Daily Etta Quill, DO   10 mg at 10/19/15 1000  . nitroGLYCERIN (NITROSTAT) SL tablet 0.4 mg  0.4 mg Sublingual Q5 min PRN Deno Etienne, DO      . ondansetron [facility]) injection 4 mg  4 mg Intravenous Q6H PRN Gardiner Barefoot, NP   4 mg at 10/17/15 2228  . oxyCODONE (Oxy IR/ROXICODONE) immediate release tablet 5 mg  5 mg Oral Q6H PRN Gardiner Barefoot, NP   5 mg at 10/18/15 0501  . sodium chloride 0.9 % injection 3 mL  3 mL Intravenous Q12H Etta Quill, DO   3 mL at 10/18/15 2038  . technetium TC 58M diethylenetriame-pentaacetic acid (DTPA) injection 30 milli Curie  30 milli Curie Intravenous Once PRN Medication Radiologist, MD      . warfarin  (COUMADIN) tablet 7.5 mg  7.5 mg Oral ONCE-1800 Costin Karlyne Greenspan, MD      . warfarin (COUMADIN) video   Does not apply Once Franky Macho, Geraldine      . Warfarin - Pharmacist Dosing Inpatient   Does not apply q1800 Etta Quill, DO   0  at 10/18/15 1800    Musculoskeletal: Strength & Muscle Tone: decreased Gait & Station: unable to stand Patient leans: N/A  Psychiatric Specialty Exam: ROS SOB and chest pain, depression and feels overwhelmed. No Fever-chills, No Headache, No changes with Vision or hearing, reports vertigo No problems swallowing food or Liquids, No Chest pain, Cough or Shortness of Breath, No Abdominal pain, No Nausea or Vommitting, Bowel movements are regular, No Blood in stool or Urine, No dysuria, No new skin rashes or bruises, No new joints pains-aches,  No new weakness, tingling, numbness in any extremity, No recent weight gain or loss, No polyuria, polydypsia or polyphagia,   A full 10 point Review of Systems was done, except as stated above, all other Review of Systems were negative.  Blood pressure 127/54, pulse 72, temperature 98.4 F (36.9 C), temperature source Oral, resp. rate 18, height _0  (1.6 m), weight 106.006 kg (233 lb 11.2 oz), SpO2 94 %.Body mass index is 41.41 kg/(m^2).  General Appearance: Casual  Eye Contact::  Good  Speech:  Clear and Coherent  Volume:  Decreased  Mood:  Anxious and Depressed  Affect:  Constricted and Depressed  Thought Process:  Coherent and Goal Directed  Orientation:  Full (Time, Place, and Person)  Thought Content:  WDL  Suicidal Thoughts:  No  Homicidal Thoughts:  No  Memory:  Immediate;   Good Recent;   Good Remote;   Fair  Judgement:  Intact  Insight:  Fair  Psychomotor Activity:  Decreased  Concentration:  Fair  Recall:  [employer] of Knowledge:Good  Language: Good  Akathisia:  Negative  Handed:  Right  AIMS (if indicated):     Assets:  Communication Skills Desire for Improvement Financial  Resources/Insurance Housing Leisure Time Resilience Social Support  ADL's:  Impaired  Cognition: WNL  Sleep:      Treatment Plan Summary: Daily contact with patient to assess and evaluate symptoms and progress in treatment and Medication management  Will start Effexor XR 37.5 mg PO Qam with breakfast starting today which can be increased to 75 mg if tolerated well  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  Disposition: Patient does not meet criteria for psychiatric inpatient admission. Supportive therapy provided about ongoing stressors.  Duron Meister,JANARDHAHA R. 10/19/2015 10:42 AM

## 2015-10-19 NOTE — Plan of Care (Cosign Needed)
Problem: Consults Goal: Diagnosis - Venous Thromboembolism (VTE) Choose a selection Outcome: Completed/Met Date Met:  10/19/15 DVT (Deep Vein Thrombosis)  PE

## 2015-10-19 NOTE — Progress Notes (Signed)
PROGRESS NOTE  CECEILA MOWAD [id number] DOB: 1956-04-28 DOA: 10/17/2015 PCP: Maximino Greenland, MD  HPI: Ms. Manninen is a 59 y/o female with PMH of CKD Stage 4, chronic diastolic heart failure, HTN, cirrhosis, CAD, PAD, depression and prior PE admitted 10/17 after presenting to the ED with chest pain and SOB that had been ongoing x 3 days. VQ scan at that time showed high probability of PE in the left lung. She has prior history of pulmonary embolism in 2014 following a surgery and was placed on Xarelto on that time. She has not been on chronic anticoagulation medications for several months now. Lower extremity duplex performed 10/19 with DVT in Left CFV.    Subjective / 24 H Interval events - no overnight events - Patient is still complaining of chest pain and some SOB today with little improvement in symptoms  - She denies burning with urination but states she feels like she is urinating more frequently than normal   Assessment/Plan: Principal Problem:   Pulmonary embolism (HCC) Active Problems:   Leucocytosis   CKD (chronic kidney disease) stage 4, GFR 15-29 ml/min (HCC)   Chronic diastolic heart failure (HCC)   HTN (hypertension)   DVT (deep venous thrombosis) (HCC)   Depression   Acute on Chronic Kidney Disease Stage 4 - creatinine is worsening today with sharp increase from 2.3 to 3.93 - unclear trigger, no contrasted studies recently, occasional Ibuprofen use at home for right flank pain 2-3 doses - making good urine - nephrology consult. - [name] baseline 1.7-1.9 - Renal US and UA pending  - monitor I&O  Pulmonary Embolism and LLE DVT - Patient presented to ED 10/18 with complaints of SOB and chest pain. V/Q scan at that time revealed high probability for pulmonary embolism in the left lung. - Bilateral LE duplex performed 10/20 positive for DVT in L CFV - patient with prior PE in 2014 for which she was anticoagulated with Xarelto for a year, now off of  anticoagulation  - Currently on heparin bridge to coumadin due to CKD.  - unable to use NOACs - Continue Heparin/Coumadin per pharmacy, pain meds PRN   Depression  - Patient has history of depression, not currently on any antidepressant medications  - psychiatry consulted 10/20, evaluation pending  HTN  - BP stable 130-140s since admission  - Continue Amlodipine, Bystolic   Chronic Diastolic Heart Failure - Last echo performed 10/2013 - EF 65-70% with Grade I diastolic dysfunction  - appears euvolemic at this time   COPD - stable, no wheezing, albuterol prn    Diet: Diet Heart Room service appropriate?: Yes; Fluid consistency:: Thin Fluids: None  DVT Prophylaxis: Anticoagulated with Heparin and Coumadin   Code Status: DNR Family Communication: Family not present at bedside at this time   Disposition Plan: TBD  Barriers to discharge: Pending nephrology workup   Consultants:  Nephrology   Psychiatry   Procedures:  None    Antibiotics  Anti-infectives    None      Studies  Dg Chest 2 View  10/17/2015  CLINICAL DATA:  Chest pain for 3 days EXAM: CHEST  2 VIEW COMPARISON:  09/11/2014 FINDINGS: Borderline cardiomegaly. No acute infiltrate or pleural effusion. No pulmonary edema. Degenerative changes mid and lower thoracic spine. IMPRESSION: No active cardiopulmonary disease. Electronically Signed   By: Lahoma Crocker M.D.   On: 10/17/2015 16:46   Nm Pulmonary Perf And Vent  10/17/2015  CLINICAL DATA:  Shortness of Breath EXAM: NUCLEAR MEDICINE  VENTILATION - PERFUSION LUNG SCAN TECHNIQUE: Ventilation images were obtained in multiple projections using inhaled aerosol Tc-79m DTPA. Perfusion images were obtained in multiple projections after intravenous injection of Tc-10m MAA. Lateral images of the lungs could not be obtained due to patient's inability to tolerate procedure. RADIOPHARMACEUTICALS:  30 mCi Technetium-15m DTPA aerosol inhalation and 3 mCi Technetium-31m  MAA IV COMPARISON:  11/14/2013 FINDINGS: Ventilation: Somewhat limited due to lack of lateral imaging. Ventilation images show no significant defect. Perfusion: Perfusion images are also limited due the lack of lateral imaging. There is a wedge-shaped defect identified which projects in the left upper lobe posteriorly. IMPRESSION: Somewhat limited exam due to the lack of lateral imaging. High probability scan for pulmonary embolus in the left lung as described. Electronically Signed   By: Inez Catalina M.D.   On: 10/17/2015 18:26   Dg Abd Portable 1v  10/18/2015  CLINICAL DATA:  Vomiting tonight. EXAM: PORTABLE ABDOMEN - 1 VIEW COMPARISON:  None. FINDINGS: Relative paucity of bowel gas with air scattered throughout colon in the central and lower abdomen. No air-filled dilated bowel loops to suggest obstruction. Surgical clips in the right upper quadrant of the abdomen. No abnormal soft tissue calcifications. Vascular calcifications are seen. IMPRESSION: Paucity of bowel gas, no air-filled dilated bowel loops are seen. Electronically Signed   By: Jeb Levering M.D.   On: 10/18/2015 00:47   Objective  Filed Vitals:   10/18/15 1001 10/18/15 1448 10/18/15 2029 10/19/15 0552  BP: 132/79 126/50 157/59 138/71  Pulse:  64 66 72  Temp:  98.6 F (37 C) 97.5 F (36.4 C) 98.4 F (36.9 C)  TempSrc:  Oral Oral Oral  Resp:  16 18 18   Height:      Weight:      SpO2:  99% 97% 94%    Intake/Output Summary (Last 24 hours) at 10/19/15 0944 Last data filed at 10/19/15 0800  Gross per 24 hour  Intake 1410.23 ml  Output      0 ml  Net 1410.23 ml   Filed Weights   10/17/15 1917 10/17/15 2141 10/18/15 0500  Weight: 109.317 kg (241 lb) 105.733 kg (233 lb 1.6 oz) 106.006 kg (233 lb 11.2 oz)   Exam:  GENERAL: NAD, patient is less ill appearing compared to yesterday  HEENT: head NCAT, no scleral icterus.  Mucous membranes are moist.   NECK: Supple. No LAD  LUNGS: Clear to auscultation. No wheezing or  crackles  HEART: Regular rate and rhythm without murmur. 2+ pulses, no JVD, bilateral LE edema, worse on the left   ABDOMEN: Soft, non-distended, non-tender. Positive bowel sounds.  EXTREMITIES: Without any cyanosis or clubbing. Good muscle tone  NEUROLOGIC: No focal neurologic deficits.   PSYCHIATRIC: Depressed mood and affect. Suicidal ideation without intent or plan.   Data Reviewed: Basic Metabolic Panel:  Recent Labs Lab 10/17/15 1600 10/19/15 0359  NA 140 132*  K 4.6 4.4  CL 114* 102  CO2 17* 21*  GLUCOSE 122* 140*  BUN 25* 47*  CREATININE 2.32* 3.93*  CALCIUM 10.0 9.4   Liver Function Tests:  Recent Labs Lab 10/17/15 1600  AST 27  ALT 21  ALKPHOS 91  BILITOT 0.6  PROT 8.0  ALBUMIN 2.6*    Recent Labs Lab 10/17/15 1550  LIPASE 22   CBC:  Recent Labs Lab 10/17/15 1550 10/18/15 0402 10/19/15 0359  WBC 17.1* 17.9* 16.3*  HGB 12.1 10.9* 11.0*  HCT 37.7 34.1* 35.6*  MCV 83.4 85.0 85.8  PLT  211 192 249   BNP (last 3 results)  Recent Labs  10/17/15 1600  BNP 115.8*   Scheduled Meds: . amLODipine  10 mg Oral Daily  . aspirin  324 mg Oral Once  .  morphine injection  4 mg Intravenous Once  . nebivolol  10 mg Oral Daily  . sodium chloride  3 mL Intravenous Q12H  . warfarin  7.5 mg Oral ONCE-1800  . warfarin   Does not apply Once  . Warfarin - Pharmacist Dosing Inpatient   Does not apply q1800   Continuous Infusions: . heparin 1,500 Units/hr (10/19/15 0900)    Elita Boone, PA-S  Marzetta Board, MD [facility] Pager [phone]. If 7 PM - 7 AM, please contact night-coverage at [email or web address], password [facility] 10/19/2015, 9:44 AM  LOS: 2 days

## 2015-10-19 NOTE — Consult Note (Addendum)
Whitney Walker is an 59 y.o. female referred by Dr Cruzita Lederer   Chief Complaint: Acute on CKD 3 HPI: 59yo hispanic female admitted 10/17/15 for CP thopught to be due to PE as VQ scan showed high probability and doppler showed DVL Lt common femoral vein.  Has CKD3 followed by Dr [name] with Scr at baseline in the upper 1's.  Scr was 1.7 in 08/23/14, 1.9 01/11/15 and on admission Scr was 2.3 and today 3.93.  No documented hypotension.  Not on ACE/ARB.  No IV contrast given.  She had taken up to 6 (maybe more as pt not a great historian) ibuprofen prior to admission.  Past Medical History  Diagnosis Date  . Anxiety   . Arthritis   . COPD (chronic obstructive pulmonary disease) ([place])   . Depression   . Hypertension   . Meningitis   . Vertigo   . Colon cancer ([place])   . Peripheral vascular disease ([place])   . CKD (chronic kidney disease)     Stage III 03/2013  . Shortness of breath   . CHF (congestive heart failure) ([place])   . PE (pulmonary embolism)   . Coronary artery disease   . Sleep apnea   . Anemia     Past Surgical History  Procedure Laterality Date  . Cesarean section      X 3 [id number]  . Cholecystectomy    . Abdominal hysterectomy    . Diverticulosis surgery-2002  2012  . Colectomy  2010  . Cholecystectomy  1980/s  . Femoral-popliteal bypass graft Left 08/13/2013    Procedure: BYPASS GRAFT FEMORAL-POPLITEAL ARTERY WITH NON-REVERSED SAPHANEOUS VEIN; ULTRASOUND GUIDED;  Surgeon: Mal [name], MD;  Location: [facility];  Service: Vascular;  Laterality: Left;  . Abdominal aortagram N/A 07/26/2013    Procedure: ABDOMINAL Maxcine Ham;  Surgeon: [name] Stonybrook, MD;  Location: [facility] CATH LAB;  Service: Cardiovascular;  Laterality: N/A;  . Lower extremity angiogram Left 07/26/2013    Procedure: LOWER EXTREMITY ANGIOGRAM;  Surgeon: [name] Bonneau Beach, MD;  Location: [facility] CATH LAB;  Service: Cardiovascular;  Laterality: Left;    Family History  Problem Relation Age of Onset  . Cancer Mother 77    breast and  bone  . Cancer Father 63    prostate  . Cancer Cousin 20    breast cancer   . Hypertension Sister   . Bleeding Disorder Sister   . Hypertension Daughter   FH neg for renal ds  Social History:  reports that she quit smoking about 14 months ago. Her smoking use included Cigarettes. She has a 60 pack-year smoking history. She has never used smokeless tobacco. She reports that she does not drink alcohol or use illicit drugs. Single, lives by self.  Does not work  Allergies:  Allergies  Allergen Reactions  . Iohexol Other (See Comments)     Code: HIVES, Desc: HIVES W/ "DYE" USED FOR 1ST CT SCAN BUT NOT 2ND, NO PREMEDS USED, PT UNCERTAIN OF CIRCUMSTANCES,,?POSSIBLE MRI CONTRAST ALLERGY, ALL STUDIES DONE "SOMEWHERE" IN [place]//A.C., Onset Date: [id number]     Medications Prior to Admission  Medication Sig Dispense Refill  . amLODipine (NORVASC) 10 MG tablet Take 10 mg by mouth daily.    [name] aspirin 81 MG tablet Take 81 mg by mouth daily.    [name] BYSTOLIC 10 MG tablet Take 10 mg by mouth daily.    [name] PROAIR RESPICLICK [id number] (90 BASE) MCG/ACT AEPB        Lab Results: UA:  ND   Recent Labs  10/17/15 1550 10/18/15 0402 10/19/15 0359  WBC 17.1* 17.9* 16.3*  HGB 12.1 10.9* 11.0*  HCT 37.7 34.1* 35.6*  PLT 211 192 249   BMET  Recent Labs  10/17/15 1600 10/19/15 0359  NA 140 132*  K 4.6 4.4  CL 114* 102  CO2 17* 21*  GLUCOSE 122* 140*  BUN 25* 47*  CREATININE 2.32* 3.93*  CALCIUM 10.0 9.4   LFT  Recent Labs  10/17/15 1600  PROT 8.0  ALBUMIN 2.6*  AST 27  ALT 21  ALKPHOS 91  BILITOT 0.6   Dg Chest 2 View  10/17/2015  CLINICAL DATA:  Chest pain for 3 days EXAM: CHEST  2 VIEW COMPARISON:  09/11/2014 FINDINGS: Borderline cardiomegaly. No acute infiltrate or pleural effusion. No pulmonary edema. Degenerative changes mid and lower thoracic spine. IMPRESSION: No active cardiopulmonary disease. Electronically Signed   By: Lahoma Crocker M.D.   On: 10/17/2015 16:46   Nm  Pulmonary Perf And Vent  10/17/2015  CLINICAL DATA:  Shortness of Breath EXAM: NUCLEAR MEDICINE VENTILATION - PERFUSION LUNG SCAN TECHNIQUE: Ventilation images were obtained in multiple projections using inhaled aerosol Tc-67m DTPA. Perfusion images were obtained in multiple projections after intravenous injection of Tc-47m MAA. Lateral images of the lungs could not be obtained due to patient's inability to tolerate procedure. RADIOPHARMACEUTICALS:  30 mCi Technetium-56m DTPA aerosol inhalation and 3 mCi Technetium-74m MAA IV COMPARISON:  11/14/2013 FINDINGS: Ventilation: Somewhat limited due to lack of lateral imaging. Ventilation images show no significant defect. Perfusion: Perfusion images are also limited due the lack of lateral imaging. There is a wedge-shaped defect identified which projects in the left upper lobe posteriorly. IMPRESSION: Somewhat limited exam due to the lack of lateral imaging. High probability scan for pulmonary embolus in the left lung as described. Electronically Signed   By: Inez Catalina M.D.   On: 10/17/2015 18:26   Dg Abd Portable 1v  10/18/2015  CLINICAL DATA:  Vomiting tonight. EXAM: PORTABLE ABDOMEN - 1 VIEW COMPARISON:  None. FINDINGS: Relative paucity of bowel gas with air scattered throughout colon in the central and lower abdomen. No air-filled dilated bowel loops to suggest obstruction. Surgical clips in the right upper quadrant of the abdomen. No abnormal soft tissue calcifications. Vascular calcifications are seen. IMPRESSION: Paucity of bowel gas, no air-filled dilated bowel loops are seen. Electronically Signed   By: Jeb Levering M.D.   On: 10/18/2015 00:47    ROS: No change in vision + pleuritic CP + SOB + Lt upper quadrant abd pain + numbness hands and feet No dysuria + loose bowels, chronic  PHYSICAL EXAM: Blood pressure 127/54, pulse 72, temperature 98.4 F (36.9 C), temperature source Oral, resp. rate 18, height 5\' 3"  (1.6 m), weight 106.006 kg  (233 lb 11.2 oz), SpO2 94 %. HEENT: PERLA EOMI NECK:No JVD LUNGS:Basilar crackles on Lt CARDIAC:RRR 1/6 systolic M.  No gallop or rub ABD:+ BS Mild LUQ tenderness, no guarding or rebound. No HSM EXT:tr edema on LLE NEURO:CNI Ox3 no asterixis  Assessment: 1. Acute on CKD 3  ? Etiology unless related to ibuprofen as that is the only insult I get from history 2. Mild Hyponatremia  ? If related to effexor 3. PE and DVT on hepain 4. HTN PLAN: 1. UA, UNa and Cr, Usom 2. Renal US 3. SPEP 4. Avoid NSAID's and contrast 5. Daily Scr 6. I/O's 7. Renal diet 8. If SNa cont to decrease then would DC effexor  Magic Mohler T 10/19/2015, 1:05 PM

## 2015-10-20 LAB — PROTIME-INR
INR: 1.15 (ref 0.00–1.49)
Prothrombin Time: 14.9 seconds (ref 11.6–15.2)

## 2015-10-20 LAB — CBC
HCT: 33 % — ABNORMAL LOW (ref 36.0–46.0)
Hemoglobin: 10.1 g/dL — ABNORMAL LOW (ref 12.0–15.0)
MCH: 26.2 pg (ref 26.0–34.0)
MCHC: 30.6 g/dL (ref 30.0–36.0)
MCV: 85.7 fL (ref 78.0–100.0)
Platelets: 249 10*3/uL (ref 150–400)
RBC: 3.85 MIL/uL — ABNORMAL LOW (ref 3.87–5.11)
RDW: 14.4 % (ref 11.5–15.5)
WBC: 15.3 10*3/uL — ABNORMAL HIGH (ref 4.0–10.5)

## 2015-10-20 LAB — RENAL FUNCTION PANEL
Albumin: 2.2 g/dL — ABNORMAL LOW (ref 3.5–5.0)
Anion gap: 10 (ref 5–15)
BUN: 53 mg/dL — ABNORMAL HIGH (ref 6–20)
CO2: 20 mmol/L — ABNORMAL LOW (ref 22–32)
Calcium: 9.2 mg/dL (ref 8.9–10.3)
Chloride: 102 mmol/L (ref 101–111)
Creatinine, Ser: 4.09 mg/dL — ABNORMAL HIGH (ref 0.44–1.00)
GFR calc Af Amer: 13 mL/min — ABNORMAL LOW (ref >60)
GFR calc non Af Amer: 11 mL/min — ABNORMAL LOW (ref >60)
Glucose, Bld: 105 mg/dL — ABNORMAL HIGH (ref 65–99)
Phosphorus: 5.8 mg/dL — ABNORMAL HIGH (ref 2.5–4.6)
Potassium: 4.4 mmol/L (ref 3.5–5.1)
Sodium: 132 mmol/L — ABNORMAL LOW (ref 135–145)

## 2015-10-20 LAB — HEPARIN LEVEL (UNFRACTIONATED)
Heparin Unfractionated: 0.11 IU/mL — ABNORMAL LOW (ref 0.30–0.70)
Heparin Unfractionated: 0.24 IU/mL — ABNORMAL LOW (ref 0.30–0.70)

## 2015-10-20 MED ORDER — HEPARIN BOLUS VIA INFUSION
1750.0000 [IU] | Freq: Once | INTRAVENOUS | Status: AC
  Administered 2015-10-20: 1750 [IU] via INTRAVENOUS
  Filled 2015-10-20: qty 1750

## 2015-10-20 MED ORDER — HEPARIN BOLUS VIA INFUSION
2500.0000 [IU] | Freq: Once | INTRAVENOUS | Status: AC
  Administered 2015-10-20: 2500 [IU] via INTRAVENOUS
  Filled 2015-10-20: qty 2500

## 2015-10-20 MED ORDER — WARFARIN SODIUM 10 MG PO TABS
10.0000 mg | ORAL_TABLET | Freq: Once | ORAL | Status: AC
  Administered 2015-10-20: 10 mg via ORAL
  Filled 2015-10-20: qty 1

## 2015-10-20 NOTE — Discharge Instructions (Signed)
Information on my medicine - Coumadin   (Warfarin)  This medication education was reviewed with me or my healthcare representative as part of my discharge preparation.  The pharmacist that spoke with me during my hospital stay was:  Saundra Shelling, [facility]  Why was Coumadin prescribed for you? Coumadin was prescribed for you because you have a blood clot or a medical condition that can cause an increased risk of forming blood clots. Blood clots can cause serious health problems by blocking the flow of blood to the heart, lung, or brain. Coumadin can prevent harmful blood clots from forming. As a reminder your indication for Coumadin is:   Pulmonary Embolism Treatment and DVT  What test will check on my response to Coumadin? While on Coumadin (warfarin) you will need to have an INR test regularly to ensure that your dose is keeping you in the desired range. The INR (international normalized ratio) number is calculated from the result of the laboratory test called prothrombin time (PT).  If an INR APPOINTMENT HAS NOT ALREADY BEEN MADE FOR YOU please schedule an appointment to have this lab work done by your health care provider within 7 days. Your INR goal is usually a number between:  2 to 3 or your provider may give you a more narrow range like 2-2.5.  Ask your health care provider during an office visit what your goal INR is.  What  do you need to  know  About  COUMADIN? Take Coumadin (warfarin) exactly as prescribed by your healthcare provider about the same time each day.  DO NOT stop taking without talking to the doctor who prescribed the medication.  Stopping without other blood clot prevention medication to take the place of Coumadin may increase your risk of developing a new clot or stroke.  Get refills before you run out.  What do you do if you miss a dose? If you miss a dose, take it as soon as you remember on the same day then continue your regularly scheduled regimen the next day.  Do not  take two doses of Coumadin at the same time.  Important Safety Information A possible side effect of Coumadin (Warfarin) is an increased risk of bleeding. You should call your healthcare provider right away if you experience any of the following: ? Bleeding from an injury or your nose that does not stop. ? Unusual colored urine (red or dark brown) or unusual colored stools (red or black). ? Unusual bruising for unknown reasons. ? A serious fall or if you hit your head (even if there is no bleeding).  Some foods or medicines interact with Coumadin (warfarin) and might alter your response to warfarin. To help avoid this: ? Eat a balanced diet, maintaining a consistent amount of Vitamin K. ? Notify your provider about major diet changes you plan to make. ? Avoid alcohol or limit your intake to 1 drink for women and 2 drinks for men per day. (1 drink is 5 oz. wine, 12 oz. beer, or 1.5 oz. liquor.)  Make sure that ANY health care provider who prescribes medication for you knows that you are taking Coumadin (warfarin).  Also make sure the healthcare provider who is monitoring your Coumadin knows when you have started a new medication including herbals and non-prescription products.  Coumadin (Warfarin)  Major Drug Interactions  Increased Warfarin Effect Decreased Warfarin Effect  Alcohol (large quantities) Antibiotics (esp. Septra/Bactrim, Flagyl, Cipro) Amiodarone (Cordarone) Aspirin (ASA) Cimetidine (Tagamet) Megestrol (Megace) NSAIDs (ibuprofen, naproxen,  etc.) °Piroxicam (Feldene) °Propafenone (Rythmol SR) °Propranolol (Inderal) °Isoniazid (INH) °Posaconazole (Noxafil) Barbiturates (Phenobarbital) °Carbamazepine (Tegretol) °Chlordiazepoxide (Librium) °Cholestyramine (Questran) °Griseofulvin °Oral Contraceptives °Rifampin °Sucralfate (Carafate) °Vitamin K  ° °Coumadin® (Warfarin) Major Herbal Interactions  °Increased Warfarin Effect Decreased Warfarin Effect  °Garlic °Ginseng °Ginkgo biloba  Coenzyme Q10 °Green tea °St. John’s wort   ° °Coumadin® (Warfarin) FOOD Interactions  °Eat a consistent number of servings per week of foods HIGH in Vitamin K °(1 serving = ½ cup)  °Collards (cooked, or boiled & drained) °Kale (cooked, or boiled & drained) °Mustard greens (cooked, or boiled & drained) °Parsley *serving size only = ¼ cup °Spinach (cooked, or boiled & drained) °Swiss chard (cooked, or boiled & drained) °Turnip greens (cooked, or boiled & drained)  °Eat a consistent number of servings per week of foods MEDIUM-HIGH in Vitamin K °(1 serving = 1 cup)  °Asparagus (cooked, or boiled & drained) °Broccoli (cooked, boiled & drained, or raw & chopped) °Brussel sprouts (cooked, or boiled & drained) *serving size only = ½ cup °Lettuce, raw (green leaf, endive, romaine) °Spinach, raw °Turnip greens, raw & chopped  ° °These websites have more information on Coumadin (warfarin):  www.coumadin.com; °www.ahrq.gov/consumer/coumadin.htm; ° ° ° °

## 2015-10-20 NOTE — Progress Notes (Signed)
Assessment: 1. Acute on CKD 3 ? NSAID or hemodynamic related to PE 2. Mild Hyponatremia ? If related to effexor 3. PE and DVT on hepain 4. HTN PLAN: 1. F/u SPEP 2. Follow renal fct   Subjective: Interval History: derpressed  Objective: Vital signs in last 24 hours: Temp:  [98.2 F (36.8 Walker)-98.9 F (37.2 Walker)] 98.2 F (36.8 Walker) (10/21 0500) Pulse Rate:  [64-66] 64 (10/21 0500) Resp:  [18] 18 (10/20 1510) BP: (127-155)/(54-71) 155/61 mmHg (10/21 0500) SpO2:  [90 %-96 %] 90 % (10/21 0500) Weight:  [106.913 kg (235 lb 11.2 oz)] 106.913 kg (235 lb 11.2 oz) (10/21 0500) Weight change:   Intake/Output from previous day: 10/20 0701 - 10/21 0700 In: 480 [P.O.:480] Out: 1176 [Urine:1175; Stool:1] Intake/Output this shift:    General appearance: alert, cooperative and moderately obese Resp: clear to auscultation bilaterally Cardio: regular rate and rhythm, S1, S2 normal, no murmur, click, rub or gallop GI: soft, non-tender; bowel sounds normal; no masses,  no organomegaly Extremities: edema 2+ tender LLE  Lab Results:  Recent Labs  10/19/15 0359 10/20/15 0330  WBC 16.3* 15.3*  HGB 11.0* 10.1*  HCT 35.6* 33.0*  PLT 249 249   BMET:  Recent Labs  10/19/15 0359 10/20/15 0330  NA 132* 132*  K 4.4 4.4  CL 102 102  CO2 21* 20*  GLUCOSE 140* 105*  BUN 47* 53*  CREATININE 3.93* 4.09*  CALCIUM 9.4 9.2   No results for input(s): PTH in the last 72 hours. Iron Studies: No results for input(s): IRON, TIBC, TRANSFERRIN, FERRITIN in the last 72 hours. Studies/Results: US Renal  10/19/2015  CLINICAL DATA:  History of renal failure, also history of carcinoma of the colon with partial colectomy EXAM: RENAL / URINARY TRACT ULTRASOUND COMPLETE COMPARISON:  CT abdomen pelvis of 05/18/2012 FINDINGS: Right Kidney: Length: 11.2 cm. No hydronephrosis is seen. A cyst is present in the lower pole laterally of 4.3 x 3.7 x 4.6 cm. Left Kidney: Length: 10.2 cm. No hydronephrosis is noted.  A 2.0 cm cyst is noted in the periphery of the left mid kidney laterally. Bladder: The urinary bladder is unremarkable. This study is somewhat compromised by large patient body habitus. IMPRESSION: 1. No hydronephrosis. 2. Single bilateral renal cysts. Electronically Signed   By: Ivar Drape M.D.   On: 10/19/2015 15:08   Scheduled: . amLODipine  10 mg Oral Daily  . aspirin  324 mg Oral Once  .  morphine injection  4 mg Intravenous Once  . nebivolol  10 mg Oral Daily  . sodium chloride  3 mL Intravenous Q12H  . venlafaxine XR  37.5 mg Oral Q breakfast  . warfarin  10 mg Oral ONCE-1800  . warfarin   Does not apply Once  . Warfarin - Pharmacist Dosing Inpatient   Does not apply q1800     LOS: 3 days   Whitney Walker 10/20/2015,8:47 AM

## 2015-10-20 NOTE — Care Management Note (Addendum)
Case Management Note  Patient Details  Name: Whitney Walker MRN: [id number] Date of Birth: 14-Nov-1956  Subjective/Objective: Pt admitted for PE/ DVT. Initiated on IV Heparin gtt.                     Action/Plan: CM will continue to monitor for disposition needs.    Expected Discharge Date:                  Expected Discharge Plan:  Home/Self Care  In-House Referral:  NA  Discharge planning Services  CM Consult  Post Acute Care Choice:    Home with Home Health Choice offered to:   Patient  DME Arranged:    DME Agency:     HH Arranged:   Registered Nurse, Physical Therapy, Social Worker and SLP [facility] Agency:   [facility]  Status of Service: Completed.  Medicare Important Message Given:  Yes-second notification given Date Medicare IM Given:    Medicare IM give by:    Date Additional Medicare IM Given:    Additional Medicare Important Message give by:     If discussed at [place] of Stay Meetings, dates discussed:    Additional Comments:  1001 10-25-15 Jacqlyn Krauss, RN,BSN [phone] Plan is for pt to d/c today home with [place]. [facility] is aware of d/c today. CM did touch base with pt before d/c to reinforce services. No further needs from CM at this time.   1528 10-23-15 Jacqlyn Krauss, RN,BSN [phone] CM did speak with pt in regards to [facility] services. Per pt she in the process of moving to a new address. Pt states will still be in [place]. Pt is agreeable to [facility] services via [facility] for [place]. Per pt she will not need PT services. CM did make referral with [facility] [place] to begin within 24-48 hrs post d/c. HH order and face to face to be added to EPIC.  Per pt she is having difficulty with transportation to and from appointments. She is currently using [employer] via [employer] CM did make pt aware that CSW can assist with providing [employer]. No further needs from CM at this time. Bethena Roys, RN 10/20/2015, 3:11 PM

## 2015-10-20 NOTE — Progress Notes (Signed)
Pt questioning why she was prescribed Effexor, verbal explanation given with printed handout, reinforced need to be compliant with medication for maximum benefit, pt verbalized understanding.  Edward Qualia RN

## 2015-10-20 NOTE — Progress Notes (Signed)
[place] for heparin and warfarin Indication: PE  Patient Measurements: Height: 5\' 3"  (160 cm) Weight: 235 lb 11.2 oz (106.913 kg) IBW/kg (Calculated) : 52.4 Heparin Dosing Weight: 79 kg  Vital Signs:    Labs:  Recent Labs  10/18/15 0402  10/18/15 0450 10/19/15 0359 10/20/15 0330 10/20/15 1635  HGB 10.9*  --   --  11.0* 10.1*  --   HCT 34.1*  --   --  35.6* 33.0*  --   PLT 192  --   --  249 249  --   LABPROT  --   --  15.1 14.5 14.9  --   INR  --   --  1.17 1.11 1.15  --   HEPARINUNFRC  --   < > 0.44 <0.10* 0.11* 0.24*  CREATININE  --   --   --  3.93* 4.09*  --   < > = values in this interval not displayed.  Assessment: 69 yoF with PE on heparin bridge to coumadin. Hgb down a bit, plt ok. No issues with line or bleeding reported per RN. INR 1.15 - no movement past 3 doses of coumadin 7.5mg . Today is Day #4/minimum 5 day overlap.  1600 HL 0.24 @ 1750 units/hr  Goal of Therapy:  INR 2-3 Heparin level 0.3-0.7 units/ml Monitor platelets by anticoagulation protocol: Yes   Plan:  Rebolus heparin with 1600units and increase rate to 1950 units/hr  Check heparin level in 8 hours Coumadin 10 mg today Daily heparin level, CBC, and INR   Rodrecus Belsky C. Lennox Grumbles, PharmD Pharmacy Resident  Pager: [phone] 10/20/2015 5:17 PM

## 2015-10-20 NOTE — Progress Notes (Signed)
[place] for heparin and warfarin Indication: PE  Patient Measurements: Height: 5\' 3"  (160 cm) Weight: 235 lb 11.2 oz (106.913 kg) IBW/kg (Calculated) : 52.4 Heparin Dosing Weight: 79 kg  Vital Signs: Temp: 98.2 F (36.8 C) (10/21 0500) Temp Source: Oral (10/21 0500) BP: 155/61 mmHg (10/21 0500) Pulse Rate: 64 (10/21 0500)  Labs:  Recent Labs  10/17/15 1600  10/18/15 0402 10/18/15 0450 10/19/15 0359 10/20/15 0330  HGB  --   --  10.9*  --  11.0* 10.1*  HCT  --   --  34.1*  --  35.6* 33.0*  PLT  --   --  192  --  249 249  LABPROT  --   < >  --  15.1 14.5 14.9  INR  --   < >  --  1.17 1.11 1.15  HEPARINUNFRC  --   --   --  0.44 <0.10* 0.11*  CREATININE 2.32*  --   --   --  3.93* 4.09*  < > = values in this interval not displayed.  Assessment: 75 yoF with PE on heparin bridge to coumadin. Heparin level subtherapeutic on 1500 units/hr. Hgb down a bit, plt ok. No issues with line or bleeding reported per RN. INR 1.15 - no movement past 3 doses of coumadin 7.5mg . Today is Day #4/minimum 5 day overlap.  Goal of Therapy:  INR 2-3 Heparin level 0.3-0.7 units/ml Monitor platelets by anticoagulation protocol: Yes   Plan:  Rebolus heparin 2500 units IV, then increase heparin to 1750 units/hr Check heparin level in 8 hours Coumadin 10 mg today Daily heparin level, CBC, and INR  Sherlon Handing, PharmD, BCPS Clinical pharmacist, pager [phone] 10/20/2015 7:30 AM

## 2015-10-20 NOTE — Progress Notes (Signed)
PROGRESS NOTE  Whitney Walker [id number] DOB: 07/07/1956 DOA: 10/17/2015 PCP: Whitney Greenland, MD   HPI: Whitney Walker is a 59 y/o female with PMH of CKD Stage 4, chronic diastolic heart failure, HTN, cirrhosis, CAD, PAD, depression and prior PE admitted 10/17 after presenting to the ED with chest pain and SOB that had been ongoing x 3 days. VQ scan at that time showed high probability of PE in the left lung. She has prior history of pulmonary embolism in 2014 following a surgery and was placed on Xarelto on that time. She has not been on chronic anticoagulation medications for several months now. Lower extremity duplex performed 10/19 with DVT in Left CFV.   Subjective / 24 H Interval events - no events overnight - States her chest pain and SOB have somewhat improved today  - denies decreased UOP - She states her mood has improved "a little bit" - denies suicidal ideation at this time   Assessment/Plan: Principal Problem:   MDD (major depressive disorder), recurrent severe, without psychosis ([place]) Active Problems:   Leucocytosis   Pulmonary embolism (HCC)   CKD (chronic kidney disease) stage 4, GFR 15-29 ml/min (HCC)   Chronic diastolic heart failure (HCC)   HTN (hypertension)   DVT (deep venous thrombosis) (HCC)   Depression  Acute on Chronic Kidney Disease Stage 4 - creatinine has somewhat stabilized today at 4.09 from 3.93 yesterday - UA performed 10/20 with granular casts, proteinuria at >300, negative for UTI - Renal US 10/20 with no evidence of hydronephrosis or obstruction   - making good urine - unclear trigger, no contrasted studies recently, occasional Ibuprofen use at home for right flank pain 2-3 doses - nephrology consulted with plan to continue to monitor Cr over the next several days, likely transient AKI attributed to stress from PE/DVT - [name] baseline 1.7-1.9 - monitor I&O  Pulmonary Embolism and LLE DVT - Patient presented to ED 10/18 with  complaints of SOB and chest pain. V/Q scan at that time revealed high probability for pulmonary embolism in the left lung. - Bilateral LE duplex performed 10/20 positive for DVT in L CFV - patient with prior PE in 2014 for which she was anticoagulated with Xarelto for a year, now off of anticoagulation, this is her second episode, she will need life long anticoagulation  - unable to use NOACs due to renal failure - INR today 1.15, today dose 4 of Coumadin  Depression  - Patient has history of depression, not currently on any antidepressant medications at home  - psychiatry consulted 10/20 - started Effexor XR 37.5 mg   HTN  - BP 155/61, continue current regimen  Chronic Diastolic Heart Failure - Last echo performed 10/2013 - EF 65-70% with Grade I diastolic dysfunction  - appears euvolemic at this time   COPD - stable, no wheezing, albuterol prn    Diet: Diet renal with fluid restriction Fluid restriction:: 1200 mL Fluid; Room service appropriate?: Yes; Fluid consistency:: Thin Fluids: None DVT Prophylaxis: Anticoagulated with Heparin/Coumadin bridge  Code Status: DNR Family Communication:  No family present at bedside at this time  Disposition Plan: TBD  Barriers to discharge: Coumadin bridge, monitoring Creatinine, PT evaluation pending  Consultants:  Nephrology   Psychiatry   Procedures:  None   Antibiotics  Anti-infectives    None       Studies  US Renal  10/19/2015  CLINICAL DATA:  History of renal failure, also history of carcinoma of the colon with partial colectomy  EXAM: RENAL / URINARY TRACT ULTRASOUND COMPLETE COMPARISON:  CT abdomen pelvis of 05/18/2012 FINDINGS: Right Kidney: Length: 11.2 cm. No hydronephrosis is seen. A cyst is present in the lower pole laterally of 4.3 x 3.7 x 4.6 cm. Left Kidney: Length: 10.2 cm. No hydronephrosis is noted. A 2.0 cm cyst is noted in the periphery of the left mid kidney laterally. Bladder: The urinary bladder is  unremarkable. This study is somewhat compromised by large patient body habitus. IMPRESSION: 1. No hydronephrosis. 2. Single bilateral renal cysts. Electronically Signed   By: Whitney Walker M.D.   On: 10/19/2015 15:08    Objective  Filed Vitals:   10/19/15 1001 10/19/15 1510 10/19/15 2130 10/20/15 0500  BP: 127/54 140/58 144/71 155/61  Pulse:  65 66 64  Temp:  98.7 F (37.1 C) 98.9 F (37.2 C) 98.2 F (36.8 C)  TempSrc:  Oral Oral Oral  Resp:  18    Height:      Weight:    106.913 kg (235 lb 11.2 oz)  SpO2:  96% 92% 90%    Intake/Output Summary (Last 24 hours) at 10/20/15 1004 Last data filed at 10/20/15 0602  Gross per 24 hour  Intake    360 ml  Output   1176 ml  Net   -816 ml   Filed Weights   10/17/15 2141 10/18/15 0500 10/20/15 0500  Weight: 105.733 kg (233 lb 1.6 oz) 106.006 kg (233 lb 11.2 oz) 106.913 kg (235 lb 11.2 oz)   Exam:  GENERAL: NAD  HEENT: head NCAT, no scleral icterus. Mucous membranes are moist.   NECK: Supple. No LAD  LUNGS: Breath sounds equal with mild rhonchi bilaterally.   HEART: Regular rate and rhythm without murmur. 2+ pulses, no JVD, bilateral LE edema worse on the left.   ABDOMEN: Soft, non-distended, non-tender. Positive bowel sounds.  EXTREMITIES: Without any cyanosis or clubbing. Good muscle tone  NEUROLOGIC: No focal neurologic deficits.   PSYCHIATRIC: Depressed mood and affect. Denies suicidal ideation.   Data Reviewed: Basic Metabolic Panel:  Recent Labs Lab 10/17/15 1600 10/19/15 0359 10/20/15 0330  NA 140 132* 132*  K 4.6 4.4 4.4  CL 114* 102 102  CO2 17* 21* 20*  GLUCOSE 122* 140* 105*  BUN 25* 47* 53*  CREATININE 2.32* 3.93* 4.09*  CALCIUM 10.0 9.4 9.2  PHOS  --   --  5.8*   Liver Function Tests:  Recent Labs Lab 10/17/15 1600 10/20/15 0330  AST 27  --   ALT 21  --   ALKPHOS 91  --   BILITOT 0.6  --   PROT 8.0  --   ALBUMIN 2.6* 2.2*    Recent Labs Lab 10/17/15 1550  LIPASE 22   CBC:  Recent  Labs Lab 10/17/15 1550 10/18/15 0402 10/19/15 0359 10/20/15 0330  WBC 17.1* 17.9* 16.3* 15.3*  HGB 12.1 10.9* 11.0* 10.1*  HCT 37.7 34.1* 35.6* 33.0*  MCV 83.4 85.0 85.8 85.7  PLT 211 192 249 249    BNP (last 3 results)  Recent Labs  10/17/15 1600  BNP 115.8*    Scheduled Meds: . amLODipine  10 mg Oral Daily  . aspirin  324 mg Oral Once  .  morphine injection  4 mg Intravenous Once  . nebivolol  10 mg Oral Daily  . sodium chloride  3 mL Intravenous Q12H  . venlafaxine XR  37.5 mg Oral Q breakfast  . warfarin  10 mg Oral ONCE-1800  . warfarin   Does  not apply Once  . Warfarin - Pharmacist Dosing Inpatient   Does not apply q1800   Continuous Infusions: . heparin 1,750 Units/hr (10/20/15 0830)   Elita Boone, PA-S  Marzetta Board, MD [facility] Pager [phone]. If 7 PM - 7 AM, please contact night-coverage at [email or web address], password [facility] 10/20/2015, 10:04 AM  LOS: 3 days

## 2015-10-20 NOTE — Care Management Important Message (Signed)
Important Message  Patient Details  Name: Whitney Walker MRN: [id number] Date of Birth: 26-Jan-1956   Medicare Important Message Given:  Yes-second notification given    Nathen May 10/20/2015, 12:12 PM

## 2015-10-21 LAB — BASIC METABOLIC PANEL
Anion gap: 10 (ref 5–15)
BUN: 54 mg/dL — ABNORMAL HIGH (ref 6–20)
CO2: 17 mmol/L — ABNORMAL LOW (ref 22–32)
Calcium: 9.5 mg/dL (ref 8.9–10.3)
Chloride: 106 mmol/L (ref 101–111)
Creatinine, Ser: 3.4 mg/dL — ABNORMAL HIGH (ref 0.44–1.00)
GFR calc Af Amer: 16 mL/min — ABNORMAL LOW (ref >60)
GFR calc non Af Amer: 14 mL/min — ABNORMAL LOW (ref >60)
Glucose, Bld: 119 mg/dL — ABNORMAL HIGH (ref 65–99)
Potassium: 4.1 mmol/L (ref 3.5–5.1)
Sodium: 133 mmol/L — ABNORMAL LOW (ref 135–145)

## 2015-10-21 LAB — HEPARIN LEVEL (UNFRACTIONATED)
Heparin Unfractionated: 0.37 IU/mL (ref 0.30–0.70)
Heparin Unfractionated: 0.25 IU/mL — ABNORMAL LOW (ref 0.30–0.70)
Heparin Unfractionated: 0.72 IU/mL — ABNORMAL HIGH (ref 0.30–0.70)

## 2015-10-21 LAB — CBC
HCT: 32.8 % — ABNORMAL LOW (ref 36.0–46.0)
Hemoglobin: 10.6 g/dL — ABNORMAL LOW (ref 12.0–15.0)
MCH: 26.8 pg (ref 26.0–34.0)
MCHC: 32.3 g/dL (ref 30.0–36.0)
MCV: 83 fL (ref 78.0–100.0)
Platelets: 256 10*3/uL (ref 150–400)
RBC: 3.95 MIL/uL (ref 3.87–5.11)
RDW: 13.6 % (ref 11.5–15.5)
WBC: 17.3 10*3/uL — ABNORMAL HIGH (ref 4.0–10.5)

## 2015-10-21 LAB — PROTIME-INR
INR: 1.41 (ref 0.00–1.49)
Prothrombin Time: 17.3 seconds — ABNORMAL HIGH (ref 11.6–15.2)

## 2015-10-21 MED ORDER — HEPARIN (PORCINE) IN NACL 100-0.45 UNIT/ML-% IJ SOLN
1900.0000 [IU]/h | INTRAMUSCULAR | Status: DC
  Administered 2015-10-21 – 2015-10-22 (×3): 2100 [IU]/h via INTRAVENOUS
  Filled 2015-10-21 (×4): qty 250

## 2015-10-21 MED ORDER — WARFARIN SODIUM 10 MG PO TABS
10.0000 mg | ORAL_TABLET | Freq: Once | ORAL | Status: AC
Start: 2015-10-21 — End: 2015-10-21
  Administered 2015-10-21: 10 mg via ORAL
  Filled 2015-10-21: qty 1

## 2015-10-21 NOTE — Progress Notes (Signed)
Assessment: 1. Acute on CKD 3 ? NSAID or hemodynamic related to PE---improving 2. Mild Hyponatremia 3. PE and DVT on hepain 4. HTN PLAN: 1. F/u SPEP 2. Follow renal fct  Subjective: Interval History: feels better  Objective: Vital signs in last 24 hours: Temp:  [98 F (36.7 C)-98.8 F (37.1 C)] 98 F (36.7 C) (10/22 0448) Pulse Rate:  [70] 70 (10/22 0448) BP: (160-185)/(66-93) 185/93 mmHg (10/22 0448) SpO2:  [90 %-93 %] 93 % (10/22 0448) Weight:  [105.28 kg (232 lb 1.6 oz)] 105.28 kg (232 lb 1.6 oz) (10/22 0448) Weight change: -1.633 kg (-3 lb 9.6 oz)  Intake/Output from previous day:   Intake/Output this shift:    General appearance: alert and cooperative Resp: clear to auscultation bilaterally GI: soft, non-tender; bowel sounds normal; no masses,  no organomegaly and obese Extremities: edema 2+  Lab Results:  Recent Labs  10/20/15 0330 10/21/15 0111  WBC 15.3* 17.3*  HGB 10.1* 10.6*  HCT 33.0* 32.8*  PLT 249 256   BMET:  Recent Labs  10/20/15 0330 10/21/15 0111  NA 132* 133*  K 4.4 4.1  CL 102 106  CO2 20* 17*  GLUCOSE 105* 119*  BUN 53* 54*  CREATININE 4.09* 3.40*  CALCIUM 9.2 9.5   No results for input(s): PTH in the last 72 hours. Iron Studies: No results for input(s): IRON, TIBC, TRANSFERRIN, FERRITIN in the last 72 hours. Studies/Results: US Renal  10/19/2015  CLINICAL DATA:  History of renal failure, also history of carcinoma of the colon with partial colectomy EXAM: RENAL / URINARY TRACT ULTRASOUND COMPLETE COMPARISON:  CT abdomen pelvis of 05/18/2012 FINDINGS: Right Kidney: Length: 11.2 cm. No hydronephrosis is seen. A cyst is present in the lower pole laterally of 4.3 x 3.7 x 4.6 cm. Left Kidney: Length: 10.2 cm. No hydronephrosis is noted. A 2.0 cm cyst is noted in the periphery of the left mid kidney laterally. Bladder: The urinary bladder is unremarkable. This study is somewhat compromised by large patient body habitus. IMPRESSION: 1. No  hydronephrosis. 2. Single bilateral renal cysts. Electronically Signed   By: Ivar Drape M.D.   On: 10/19/2015 15:08   Scheduled: . amLODipine  10 mg Oral Daily  .  morphine injection  4 mg Intravenous Once  . nebivolol  10 mg Oral Daily  . sodium chloride  3 mL Intravenous Q12H  . venlafaxine XR  37.5 mg Oral Q breakfast  . warfarin  10 mg Oral ONCE-1800  . warfarin   Does not apply Once  . Warfarin - Pharmacist Dosing Inpatient   Does not apply q1800     LOS: 4 days   Jerman Tinnon C 10/21/2015,11:22 AM

## 2015-10-21 NOTE — Progress Notes (Signed)
Pt Refused a bath after she said she hadnt had one in 5 days. Said she wanted to wait for her daughter to do it today. Said linens hadnt been changed in 5 days, so placed in chair and did full linen change

## 2015-10-21 NOTE — Progress Notes (Signed)
ANTICOAGULATION CONSULT NOTE - Follow Up Consult  Pharmacy Consult:  Heparin / Coumadin (Overlap D#5/5) Indication:  New PE + left common femoral vein DVT  Allergies  Allergen Reactions  . Iohexol Other (See Comments)     Code: HIVES, Desc: HIVES W/ "DYE" USED FOR 1ST CT SCAN BUT NOT 2ND, NO PREMEDS USED, PT UNCERTAIN OF CIRCUMSTANCES,,?POSSIBLE MRI CONTRAST ALLERGY, ALL STUDIES DONE "SOMEWHERE" IN [place]//[place].C[place] Onset Date: [id number]     Patient Measurements: Height: 5\' 3"  (160 cm) Weight: 232 lb 1.6 oz (105.28 kg) IBW/kg (Calculated) : 52.4 Heparin Dosing Weight: 79 kg  Vital Signs: Temp: 98 F (36.7 C) (10/22 0448) Temp Source: Oral (10/22 0448) BP: 185/93 mmHg (10/22 0448) Pulse Rate: 70 (10/22 0448)  Labs:  Recent Labs  10/19/15 0359 10/20/15 0330 10/20/15 1635 10/21/15 0111 10/21/15 0747  HGB 11.0* 10.1*  --  10.6*  --   HCT 35.6* 33.0*  --  32.8*  --   PLT 249 249  --  256  --   LABPROT 14.5 14.9  --  17.3*  --   INR 1.11 1.15  --  1.41  --   HEPARINUNFRC <0.10* 0.11* 0.24* 0.37 0.25*  CREATININE 3.93* 4.09*  --  3.40*  --     Estimated Creatinine Clearance: 20.4 mL/min (by C-G formula based on Cr of 3.4).     Assessment: 61 YOF with new PE and DVT to continue on IV heparin and Coumadin.  Heparin level decreased to sub-therapeutic level this AM and INR is trending up toward goal.  No bleeding nor issue with heparin infusion reported.   Goal of Therapy:  Heparin level 0.3-0.7 units/ml  INR 2 - 3 Monitor platelets by anticoagulation protocol: Yes    Plan:  - Increase heparin gtt to 2100 units/hr - Check 8 hr HL - Repeat Coumadin 10mg  PO today - Daily HL / CBC / PT / INR   Yasmin Dibello D. Mina Marble, PharmD, BCPS Pager:  [phone] 10/21/2015, 8:53 AM

## 2015-10-21 NOTE — Progress Notes (Signed)
PROGRESS NOTE  Whitney Walker [id number] DOB: Sep 26, 1956 DOA: 10/17/2015 PCP: Maximino Greenland, MD   HPI: [name] is a 59 y/o female with PMH of CKD Stage 4, chronic diastolic heart failure, HTN, cirrhosis, CAD, PAD, depression and prior PE admitted 10/17 after presenting to the ED with chest pain and SOB that had been ongoing x 3 days. VQ scan at that time showed high probability of PE in the left lung. She has prior history of pulmonary embolism in 2014 following a surgery and was placed on Xarelto on that time. She has not been on chronic anticoagulation medications for several months now. Lower extremity duplex performed 10/19 with DVT in Left CFV.   Subjective / 24 H Interval events - no events overnight - feels better this morning, less chest pain and less dyspnea. On room air.   Assessment/Plan: Principal Problem:   MDD (major depressive disorder), recurrent severe, without psychosis ([place]) Active Problems:   Leucocytosis   Pulmonary embolism (HCC)   CKD (chronic kidney disease) stage 4, GFR 15-29 ml/min (HCC)   Chronic diastolic heart failure (HCC)   HTN (hypertension)   DVT (deep venous thrombosis) (HCC)   Depression  Acute on Chronic Kidney Disease Stage 4 - creatinine improving  - UA performed 10/20 with granular casts, proteinuria at >300, negative for UTI - Renal US 10/20 with no evidence of hydronephrosis or obstruction   - making good urine - unclear trigger, no contrasted studies recently, occasional Ibuprofen use at home for right flank pain 2-3 doses - nephrology consulted with plan to continue to monitor Cr over the next several days, likely transient AKI attributed to stress from PE/DVT - [name] baseline 1.7-1.9  Pulmonary Embolism and LLE DVT - Patient presented to ED 10/18 with complaints of SOB and chest pain. V/Q scan at that time revealed high probability for pulmonary embolism in the left lung. - Bilateral LE duplex performed 10/20 positive  for DVT in L CFV - patient with prior PE in 2014 for which she was anticoagulated with Xarelto for a year, now off of anticoagulation, this is her second episode, she will need life long anticoagulation  - unable to use NOACs due to renal failure - continue coumadin  Depression  - Patient has history of depression, not currently on any antidepressant medications at home  - psychiatry consulted 10/20 - started Effexor XR 37.5 mg   HTN  - BP 155/61, continue current regimen  Chronic Diastolic Heart Failure - Last echo performed 10/2013 - EF 65-70% with Grade I diastolic dysfunction  - appears euvolemic at this time   COPD - stable, no wheezing, albuterol prn    Diet: Diet renal with fluid restriction Fluid restriction:: 1200 mL Fluid; Room service appropriate?: Yes; Fluid consistency:: Thin Fluids: None DVT Prophylaxis: Anticoagulated with Heparin/Coumadin bridge  Code Status: DNR Family Communication:  No family present at bedside at this time  Disposition Plan: TBD  Barriers to discharge: Coumadin bridge, monitoring Creatinine, PT evaluation pending  Consultants:  Nephrology   Psychiatry   Procedures:  None   Antibiotics  Anti-infectives    None       Studies  US Renal  10/19/2015  CLINICAL DATA:  History of renal failure, also history of carcinoma of the colon with partial colectomy EXAM: RENAL / URINARY TRACT ULTRASOUND COMPLETE COMPARISON:  CT abdomen pelvis of 05/18/2012 FINDINGS: Right Kidney: Length: 11.2 cm. No hydronephrosis is seen. A cyst is present in the lower pole laterally of 4.3  x 3.7 x 4.6 cm. Left Kidney: Length: 10.2 cm. No hydronephrosis is noted. A 2.0 cm cyst is noted in the periphery of the left mid kidney laterally. Bladder: The urinary bladder is unremarkable. This study is somewhat compromised by large patient body habitus. IMPRESSION: 1. No hydronephrosis. 2. Single bilateral renal cysts. Electronically Signed   By: Ivar Drape M.D.    On: 10/19/2015 15:08    Objective  Filed Vitals:   10/19/15 2130 10/20/15 0500 10/20/15 2031 10/21/15 0448  BP: 144/71 155/61 160/66 185/93  Pulse: 66 64 70 70  Temp: 98.9 F (37.2 C) 98.2 F (36.8 C) 98.8 F (37.1 C) 98 F (36.7 C)  TempSrc: Oral Oral Oral Oral  Resp:      Height:      Weight:  106.913 kg (235 lb 11.2 oz)  105.28 kg (232 lb 1.6 oz)  SpO2: 92% 90% 90% 93%   No intake or output data in the 24 hours ending 10/21/15 1154 Filed Weights   10/18/15 0500 10/20/15 0500 10/21/15 0448  Weight: 106.006 kg (233 lb 11.2 oz) 106.913 kg (235 lb 11.2 oz) 105.28 kg (232 lb 1.6 oz)   Exam:  GENERAL: NAD  HEENT: head NCAT, no scleral icterus. Mucous membranes are moist.   LUNGS: Breath sounds equal with mild rhonchi bilaterally.   HEART: Regular rate and rhythm without murmur. 2+ pulses, no JVD, bilateral LE edema worse on the left.   ABDOMEN: Soft, non-distended, non-tender. Positive bowel sounds.  EXTREMITIES: Without any cyanosis or clubbing. Good muscle tone  Data Reviewed: Basic Metabolic Panel:  Recent Labs Lab 10/17/15 1600 10/19/15 0359 10/20/15 0330 10/21/15 0111  NA 140 132* 132* 133*  K 4.6 4.4 4.4 4.1  CL 114* 102 102 106  CO2 17* 21* 20* 17*  GLUCOSE 122* 140* 105* 119*  BUN 25* 47* 53* 54*  CREATININE 2.32* 3.93* 4.09* 3.40*  CALCIUM 10.0 9.4 9.2 9.5  PHOS  --   --  5.8*  --    Liver Function Tests:  Recent Labs Lab 10/17/15 1600 10/20/15 0330  AST 27  --   ALT 21  --   ALKPHOS 91  --   BILITOT 0.6  --   PROT 8.0  --   ALBUMIN 2.6* 2.2*    Recent Labs Lab 10/17/15 1550  LIPASE 22   CBC:  Recent Labs Lab 10/17/15 1550 10/18/15 0402 10/19/15 0359 10/20/15 0330 10/21/15 0111  WBC 17.1* 17.9* 16.3* 15.3* 17.3*  HGB 12.1 10.9* 11.0* 10.1* 10.6*  HCT 37.7 34.1* 35.6* 33.0* 32.8*  MCV 83.4 85.0 85.8 85.7 83.0  PLT 211 192 249 249 256    BNP (last 3 results)  Recent Labs  10/17/15 1600  BNP 115.8*    Scheduled  Meds: . amLODipine  10 mg Oral Daily  .  morphine injection  4 mg Intravenous Once  . nebivolol  10 mg Oral Daily  . sodium chloride  3 mL Intravenous Q12H  . venlafaxine XR  37.5 mg Oral Q breakfast  . warfarin  10 mg Oral ONCE-1800  . warfarin   Does not apply Once  . Warfarin - Pharmacist Dosing Inpatient   Does not apply q1800   Continuous Infusions: . heparin 2,100 Units/hr (10/21/15 0859)    Marzetta Board, MD [facility] Pager [phone]. If 7 PM - 7 AM, please contact night-coverage at [email or web address], password [facility] 10/21/2015, 11:54 AM  LOS: 4 days

## 2015-10-21 NOTE — Evaluation (Signed)
Physical Therapy Evaluation Patient Details Name: Whitney Walker MRN: [id number] DOB: 1956/05/25 Today's Date: 10/21/2015   History of Present Illness  Whitney Walker is a 59 y/o female with PMH of CKD Stage 4, chronic diastolic heart failure, HTN, cirrhosis, CAD, PAD, depression and prior PE admitted 10/17 after presenting to the ED with chest pain and SOB that had been ongoing x 3 days.  VQ scan shows high probability for new PE.  Heparin started.  Clinical Impression  Pt admitted with/for PE.  Pt currently limited functionally due to the problems listed below.  (see problems list.)  Pt will benefit from PT to maximize function and safety to be able to get home safely with available assist of family.     Follow Up Recommendations Home health PT    Equipment Recommendations  None recommended by PT    Recommendations for Other Services       Precautions / Restrictions Restrictions Weight Bearing Restrictions: No      Mobility  Bed Mobility Overal bed mobility: Modified Independent                Transfers Overall transfer level: Modified independent                  Ambulation/Gait Ambulation/Gait assistance: Supervision;Modified independent (Device/Increase time) (mod I/I in room,  supervision in halls) Ambulation Distance (Feet): 200 Feet Assistive device: None Gait Pattern/deviations: Step-through pattern;Decreased stride length Gait velocity: moderate Gait velocity interpretation: at or above normal speed for age/gender General Gait Details: generally steady, but noticeably SOB.  pt still able to talk during ambulation.  Stairs            Wheelchair Mobility    Modified Rankin (Stroke Patients Only)       Balance Overall balance assessment: Needs assistance Sitting-balance support: No upper extremity supported Sitting balance-Leahy Scale: Good     Standing balance support: No upper extremity supported Standing balance-Leahy Scale: Fair                                Pertinent Vitals/Pain Pain Assessment: No/denies pain    Home Living Family/patient expects to be discharged to:: Private residence Living Arrangements: Alone Available Help at Discharge: Available PRN/intermittently;Other (Comment) (pt relates not enough assistance) Type of Home: Apartment Home Access: Stairs to enter Entrance Stairs-Rails: Right;Left Entrance Stairs-Number of Steps: 1-10 Home Layout: One level Home Equipment: None Additional Comments: poor historian at times    Prior Function Level of Independence: Independent         Comments: relates keeping up household being difficult with little help from son and daughter already too loaded.     Hand Dominance        Extremity/Trunk Assessment   Upper Extremity Assessment: Overall WFL for tasks assessed           Lower Extremity Assessment: Overall WFL for tasks assessed         Communication   Communication: No difficulties  Cognition Arousal/Alertness: Awake/alert Behavior During Therapy: WFL for tasks assessed/performed Overall Cognitive Status: Within Functional Limits for tasks assessed                      General Comments      Exercises        Assessment/Plan    PT Assessment Patient needs continued PT services  PT Diagnosis Generalized weakness;Other (comment) (decr activity tolerance)  PT Problem List Decreased strength;Decreased activity tolerance;Decreased mobility;Cardiopulmonary status limiting activity  PT Treatment Interventions Gait training;Stair training;Functional mobility training;Therapeutic activities;Patient/family education   PT Goals (Current goals can be found in the Care Plan section) Acute Rehab PT Goals Patient Stated Goal: Be independent as I can, get to a better home situation PT Goal Formulation: With patient Time For Goal Achievement: 10/28/15 Potential to Achieve Goals: Good    Frequency Min 3X/week    Barriers to discharge Decreased caregiver support      Co-evaluation               End of Session   Activity Tolerance: Patient tolerated treatment well Patient left: in bed;with call bell/phone within reach;Other (comment) (sitting EOB) Nurse Communication: Mobility status         Time: [id number] PT Time Calculation (min) (ACUTE ONLY): 24 min   Charges:   PT Evaluation $Initial PT Evaluation Tier I: 1 Procedure PT Treatments $Gait Training: 8-22 mins   PT G Codes:        Whitney Walker, Whitney Walker 10/21/2015, 6:01 PM 10/21/2015  Whitney Walker, [place] [phone]  (pager)

## 2015-10-21 NOTE — Progress Notes (Signed)
ANTICOAGULATION CONSULT NOTE - Follow Up Consult  Pharmacy Consult for heparin Indication: pulmonary embolus   Labs:  Recent Labs  10/19/15 0359 10/20/15 0330 10/20/15 1635 10/21/15 0111  HGB 11.0* 10.1*  --  10.6*  HCT 35.6* 33.0*  --  32.8*  PLT 249 249  --  256  LABPROT 14.5 14.9  --  17.3*  INR 1.11 1.15  --  1.41  HEPARINUNFRC <0.10* 0.11* 0.24* 0.37  CREATININE 3.93* 4.09*  --  3.40*     Assessment/Plan:  59yo female therapeutic on heparin after rate increase. Will continue gtt at current rate and confirm stable with additional level.   Wynona Neat, PharmD, BCPS  10/21/2015,2:34 AM

## 2015-10-21 NOTE — Progress Notes (Signed)
ANTICOAGULATION CONSULT NOTE - Follow Up Consult  Pharmacy Consult for heparin Indication: New PE + DVT  Allergies  Allergen Reactions  . Iohexol Other (See Comments)     Code: HIVES, Desc: HIVES W/ "DYE" USED FOR 1ST CT SCAN BUT NOT 2ND, NO PREMEDS USED, PT UNCERTAIN OF CIRCUMSTANCES,,?POSSIBLE MRI CONTRAST ALLERGY, ALL STUDIES DONE "SOMEWHERE" IN [place]//A.C., Onset Date: [id number]     Patient Measurements: Height: 5\' 3"  (160 cm) Weight: 232 lb 1.6 oz (105.28 kg) IBW/kg (Calculated) : 52.4 Heparin Dosing Weight: 79 kg  Vital Signs: Temp: 98 F (36.7 C) (10/22 1500) Temp Source: Oral (10/22 1500) BP: 165/82 mmHg (10/22 1500) Pulse Rate: 58 (10/22 1500)  Labs:  Recent Labs  10/19/15 0359 10/20/15 0330  10/21/15 0111 10/21/15 0747 10/21/15 1900  HGB 11.0* 10.1*  --  10.6*  --   --   HCT 35.6* 33.0*  --  32.8*  --   --   PLT 249 249  --  256  --   --   LABPROT 14.5 14.9  --  17.3*  --   --   INR 1.11 1.15  --  1.41  --   --   HEPARINUNFRC <0.10* 0.11*  < > 0.37 0.25* 0.72*  CREATININE 3.93* 4.09*  --  3.40*  --   --   < > = values in this interval not displayed.  Estimated Creatinine Clearance: 20.4 mL/min (by C-G formula based on Cr of 3.4).   Medications:  Scheduled:  . amLODipine  10 mg Oral Daily  .  morphine injection  4 mg Intravenous Once  . nebivolol  10 mg Oral Daily  . sodium chloride  3 mL Intravenous Q12H  . venlafaxine XR  37.5 mg Oral Q breakfast  . warfarin   Does not apply Once  . Warfarin - Pharmacist Dosing Inpatient   Does not apply q1800   Infusions:  . heparin 2,100 Units/hr (10/21/15 1858)    Assessment: 59 yo female with new PE and DVT is currently on heparin.  Heparin level is 0.72 which is slightly higher than goal but expect it to come down a bit.  Goal of Therapy:  Heparin level 0.3-0.7 units/ml Monitor platelets by anticoagulation protocol: Yes   Plan:  - continue heparin at 2100 units/hr - repeat heparin at midnight  to confirm  Jahni Paul, Tsz-Yin 10/21/2015,8:00 PM

## 2015-10-22 LAB — CBC
HCT: 33.9 % — ABNORMAL LOW (ref 36.0–46.0)
Hemoglobin: 11 g/dL — ABNORMAL LOW (ref 12.0–15.0)
MCH: 26.7 pg (ref 26.0–34.0)
MCHC: 32.4 g/dL (ref 30.0–36.0)
MCV: 82.3 fL (ref 78.0–100.0)
Platelets: 289 10*3/uL (ref 150–400)
RBC: 4.12 MIL/uL (ref 3.87–5.11)
RDW: 13.6 % (ref 11.5–15.5)
WBC: 17.1 10*3/uL — ABNORMAL HIGH (ref 4.0–10.5)

## 2015-10-22 LAB — HEPARIN LEVEL (UNFRACTIONATED): Heparin Unfractionated: 0.48 IU/mL (ref 0.30–0.70)

## 2015-10-22 LAB — PROTIME-INR
INR: 1.89 — ABNORMAL HIGH (ref 0.00–1.49)
Prothrombin Time: 21.6 seconds — ABNORMAL HIGH (ref 11.6–15.2)

## 2015-10-22 MED ORDER — WARFARIN SODIUM 7.5 MG PO TABS
7.5000 mg | ORAL_TABLET | Freq: Once | ORAL | Status: AC
  Administered 2015-10-22: 7.5 mg via ORAL
  Filled 2015-10-22: qty 1

## 2015-10-22 NOTE — Progress Notes (Signed)
ANTICOAGULATION CONSULT NOTE - Follow Up Consult  Pharmacy Consult:  Heparin / Coumadin (Overlap D#6/5) Indication:  New PE + left common femoral vein DVT  Allergies  Allergen Reactions  . Iohexol Other (See Comments)     Code: HIVES, Desc: HIVES W/ "DYE" USED FOR 1ST CT SCAN BUT NOT 2ND, NO PREMEDS USED, PT UNCERTAIN OF CIRCUMSTANCES,,?POSSIBLE MRI CONTRAST ALLERGY, ALL STUDIES DONE "SOMEWHERE" IN [place]//A.C., Onset Date: [id number]     Patient Measurements: Height: 5\' 3"  (160 cm) Weight: 237 lb 8 oz (107.729 kg) IBW/kg (Calculated) : 52.4 Heparin Dosing Weight: 79 kg  Vital Signs: Temp: 98 F (36.7 C) (10/23 0458) Temp Source: Oral (10/23 0458) BP: 148/54 mmHg (10/23 0458) Pulse Rate: 58 (10/23 0458)  Labs:  Recent Labs  10/20/15 0330  10/21/15 0111 10/21/15 0747 10/21/15 1900 10/22/15 0006  HGB 10.1*  --  10.6*  --   --  11.0*  HCT 33.0*  --  32.8*  --   --  33.9*  PLT 249  --  256  --   --  289  LABPROT 14.9  --  17.3*  --   --  21.6*  INR 1.15  --  1.41  --   --  1.89*  HEPARINUNFRC 0.11*  < > 0.37 0.25* 0.72* 0.48  CREATININE 4.09*  --  3.40*  --   --   --   < > = values in this interval not displayed.  Estimated Creatinine Clearance: 20.7 mL/min (by C-G formula based on Cr of 3.4).     Assessment: 2 YOF with new PE and DVT to continue on IV heparin and Coumadin.  Heparin level is therapeutic and INR is approaching goal.  No bleeding reported.   Goal of Therapy:  Heparin level 0.3-0.7 units/ml  INR 2 - 3 Monitor platelets by anticoagulation protocol: Yes    Plan:  - Continue heparin gtt at 2100 units/hr - Coumadin 7.5mg  PO today - Daily HL / CBC / PT / INR - F/U resume home med ASA   Itsel Opfer D. Mina Marble, PharmD, BCPS Pager:  [phone] 10/22/2015, 8:11 AM

## 2015-10-22 NOTE — Progress Notes (Signed)
PROGRESS NOTE  MYRISSA Walker [id number] DOB: Aug 12, 1956 DOA: 10/17/2015 PCP: Maximino Greenland, MD   HPI: [name] is a 59 y/o female with PMH of CKD Stage 4, chronic diastolic heart failure, HTN, cirrhosis, CAD, PAD, depression and prior PE admitted 10/17 after presenting to the ED with chest pain and SOB that had been ongoing x 3 days. VQ scan at that time showed high probability of PE in the left lung. She has prior history of pulmonary embolism in 2014 following a surgery and was placed on Xarelto on that time. She has not been on chronic anticoagulation medications for several months now. Lower extremity duplex performed 10/19 with DVT in Left CFV.   Subjective / 24 H Interval events - no events overnight - appears depressed this morning because of frequent labs  Assessment/Plan: Principal Problem:   MDD (major depressive disorder), recurrent severe, without psychosis ([place]) Active Problems:   Leucocytosis   Pulmonary embolism (HCC)   CKD (chronic kidney disease) stage 4, GFR 15-29 ml/min (HCC)   Chronic diastolic heart failure (HCC)   HTN (hypertension)   DVT (deep venous thrombosis) (HCC)   Depression  Acute on Chronic Kidney Disease Stage 4 - creatinine improving - UA performed 10/20 with granular casts, proteinuria at >300, negative for UTI - Renal US 10/20 with no evidence of hydronephrosis or obstruction   - making good urine - unclear trigger, no contrasted studies recently, occasional Ibuprofen use at home for right flank pain 2-3 doses - nephrology consulted with plan to continue to monitor Cr over the next several days, likely transient AKI attributed to stress from PE/DVT - [name] baseline 1.7-1.9  Pulmonary Embolism and LLE DVT - Patient presented to ED 10/18 with complaints of SOB and chest pain. V/Q scan at that time revealed high probability for pulmonary embolism in the left lung. - Bilateral LE duplex performed 10/20 positive for DVT in L CFV -  patient with prior PE in 2014 for which she was anticoagulated with Xarelto for a year, now off of anticoagulation, this is her second episode, she will need life long anticoagulation  - unable to use NOACs due to renal failure - continue coumadin  Depression  - Patient has history of depression, not currently on any antidepressant medications at home  - psychiatry consulted 10/20 - started Effexor XR 37.5 mg   HTN  - BP stable, continue current regimen  Chronic Diastolic Heart Failure - Last echo performed 10/2013 - EF 65-70% with Grade I diastolic dysfunction  - appears euvolemic at this time   COPD - stable, no wheezing, albuterol prn    Diet: Diet renal with fluid restriction Fluid restriction:: 1200 mL Fluid; Room service appropriate?: Yes; Fluid consistency:: Thin Fluids: None DVT Prophylaxis: Anticoagulated with Heparin/Coumadin bridge  Code Status: DNR Family Communication:  No family present at bedside at this time, discussed with her daughter last night Disposition Plan: TBD  Barriers to discharge: Coumadin bridge, monitoring Creatinine  Consultants:  Nephrology   Psychiatry   Procedures:  None   Antibiotics  Anti-infectives    None       Studies  No results found.  Objective  Filed Vitals:   10/21/15 0448 10/21/15 1500 10/21/15 2034 10/22/15 0458  BP: 185/93 165/82 165/67 148/54  Pulse: 70 58 58 58  Temp: 98 F (36.7 C) 98 F (36.7 C) 98.2 F (36.8 C) 98 F (36.7 C)  TempSrc: Oral Oral Oral Oral  Resp:  18    Height:  Weight: 105.28 kg (232 lb 1.6 oz)   107.729 kg (237 lb 8 oz)  SpO2: 93% 95% 94% 93%    Intake/Output Summary (Last 24 hours) at 10/22/15 0724 Last data filed at 10/22/15 0500  Gross per 24 hour  Intake 449.65 ml  Output      0 ml  Net 449.65 ml   Filed Weights   10/20/15 0500 10/21/15 0448 10/22/15 0458  Weight: 106.913 kg (235 lb 11.2 oz) 105.28 kg (232 lb 1.6 oz) 107.729 kg (237 lb 8 oz)    Exam:  GENERAL: NAD  HEENT: head NCAT, no scleral icterus. Mucous membranes are moist.   LUNGS: Breath sounds equal with mild rhonchi bilaterally.   HEART: Regular rate and rhythm without murmur. 2+ pulses, no JVD, bilateral LE edema worse on the left.   ABDOMEN: Soft, non-distended, non-tender. Positive bowel sounds.  EXTREMITIES: Without any cyanosis or clubbing. Good muscle tone  Data Reviewed: Basic Metabolic Panel:  Recent Labs Lab 10/17/15 1600 10/19/15 0359 10/20/15 0330 10/21/15 0111  NA 140 132* 132* 133*  K 4.6 4.4 4.4 4.1  CL 114* 102 102 106  CO2 17* 21* 20* 17*  GLUCOSE 122* 140* 105* 119*  BUN 25* 47* 53* 54*  CREATININE 2.32* 3.93* 4.09* 3.40*  CALCIUM 10.0 9.4 9.2 9.5  PHOS  --   --  5.8*  --    Liver Function Tests:  Recent Labs Lab 10/17/15 1600 10/20/15 0330  AST 27  --   ALT 21  --   ALKPHOS 91  --   BILITOT 0.6  --   PROT 8.0  --   ALBUMIN 2.6* 2.2*    Recent Labs Lab 10/17/15 1550  LIPASE 22   CBC:  Recent Labs Lab 10/18/15 0402 10/19/15 0359 10/20/15 0330 10/21/15 0111 10/22/15 0006  WBC 17.9* 16.3* 15.3* 17.3* 17.1*  HGB 10.9* 11.0* 10.1* 10.6* 11.0*  HCT 34.1* 35.6* 33.0* 32.8* 33.9*  MCV 85.0 85.8 85.7 83.0 82.3  PLT 192 249 249 256 289    BNP (last 3 results)  Recent Labs  10/17/15 1600  BNP 115.8*    Scheduled Meds: . amLODipine  10 mg Oral Daily  .  morphine injection  4 mg Intravenous Once  . nebivolol  10 mg Oral Daily  . sodium chloride  3 mL Intravenous Q12H  . venlafaxine XR  37.5 mg Oral Q breakfast  . warfarin   Does not apply Once  . Warfarin - Pharmacist Dosing Inpatient   Does not apply q1800   Continuous Infusions: . heparin 2,100 Units/hr (10/21/15 1858)    Marzetta Board, MD [facility] Pager [phone]. If 7 PM - 7 AM, please contact night-coverage at [email or web address], password [facility] 10/22/2015, 7:24 AM  LOS: 5 days

## 2015-10-22 NOTE — Progress Notes (Signed)
Assessment: 1. Acute on CKD 3 followed by Dr. [name] (creat 1.9 01/11/15) ? NSAID or hemodynamic related to PE---improving 2. Mild Hyponatremia 3. PE and DVT on hepain 4. HTN PLAN: 1. SPEP not done, reordered for AM 2. Follow renal fct, no cr today but can wait for cr on Mon  Subjective: Interval History: complaining about food  Objective: Vital signs in last 24 hours: Temp:  [98 F (36.7 C)-98.2 F (36.8 C)] 98 F (36.7 C) (10/23 0458) Pulse Rate:  [58] 58 (10/23 0458) Resp:  [18] 18 (10/22 1500) BP: (148-165)/(54-82) 162/69 mmHg (10/23 1041) SpO2:  [93 %-95 %] 93 % (10/23 0458) Weight:  [107.729 kg (237 lb 8 oz)] 107.729 kg (237 lb 8 oz) (10/23 0458) Weight change: 2.449 kg (5 lb 6.4 oz)  Intake/Output from previous day: 10/22 0701 - 10/23 0700 In: 449.7 [P.O.:240; I.V.:209.7] Out: -  Intake/Output this shift:    General appearance: alert and cooperative Resp: clear to auscultation bilaterally Cardio: regular rate and rhythm, S1, S2 normal, no murmur, click, rub or gallop Extremities: edema 1+  Obese, edentulous  Lab Results:  Recent Labs  10/21/15 0111 10/22/15 0006  WBC 17.3* 17.1*  HGB 10.6* 11.0*  HCT 32.8* 33.9*  PLT 256 289   BMET:  Recent Labs  10/20/15 0330 10/21/15 0111  NA 132* 133*  K 4.4 4.1  CL 102 106  CO2 20* 17*  GLUCOSE 105* 119*  BUN 53* 54*  CREATININE 4.09* 3.40*  CALCIUM 9.2 9.5   No results for input(s): PTH in the last 72 hours. Iron Studies: No results for input(s): IRON, TIBC, TRANSFERRIN, FERRITIN in the last 72 hours. Studies/Results: No results found.  Scheduled: . amLODipine  10 mg Oral Daily  .  morphine injection  4 mg Intravenous Once  . nebivolol  10 mg Oral Daily  . sodium chloride  3 mL Intravenous Q12H  . venlafaxine XR  37.5 mg Oral Q breakfast  . warfarin  7.5 mg Oral ONCE-1800  . warfarin   Does not apply Once  . Warfarin - Pharmacist Dosing Inpatient   Does not apply q1800      LOS: 5 days    Kawanna Christley C 10/22/2015,10:59 AM

## 2015-10-22 NOTE — Progress Notes (Signed)
ANTICOAGULATION CONSULT NOTE - Follow Up Consult  Pharmacy Consult for heparin Indication: pulmonary embolus and DVT   Labs:  Recent Labs  10/19/15 0359 10/20/15 0330  10/21/15 0111 10/21/15 0747 10/21/15 1900 10/22/15 0006  HGB 11.0* 10.1*  --  10.6*  --   --  11.0*  HCT 35.6* 33.0*  --  32.8*  --   --  33.9*  PLT 249 249  --  256  --   --  289  LABPROT 14.5 14.9  --  17.3*  --   --  21.6*  INR 1.11 1.15  --  1.41  --   --  1.89*  HEPARINUNFRC <0.10* 0.11*  < > 0.37 0.25* 0.72* 0.48  CREATININE 3.93* 4.09*  --  3.40*  --   --   --   < > = values in this interval not displayed.    Assessment/Plan:  59yo female therapeutic on heparin. Will continue gtt at current rate and monitor daily.   Wynona Neat, PharmD, BCPS  10/22/2015,12:59 AM

## 2015-10-23 LAB — RENAL FUNCTION PANEL
Albumin: 2.2 g/dL — ABNORMAL LOW (ref 3.5–5.0)
Anion gap: 7 (ref 5–15)
BUN: 48 mg/dL — ABNORMAL HIGH (ref 6–20)
CO2: 17 mmol/L — ABNORMAL LOW (ref 22–32)
Calcium: 9.8 mg/dL (ref 8.9–10.3)
Chloride: 114 mmol/L — ABNORMAL HIGH (ref 101–111)
Creatinine, Ser: 2.89 mg/dL — ABNORMAL HIGH (ref 0.44–1.00)
GFR calc Af Amer: 19 mL/min — ABNORMAL LOW (ref >60)
GFR calc non Af Amer: 17 mL/min — ABNORMAL LOW (ref >60)
Glucose, Bld: 114 mg/dL — ABNORMAL HIGH (ref 65–99)
Phosphorus: 4.7 mg/dL — ABNORMAL HIGH (ref 2.5–4.6)
Potassium: 4.3 mmol/L (ref 3.5–5.1)
Sodium: 138 mmol/L (ref 135–145)

## 2015-10-23 LAB — CBC
HCT: 34.3 % — ABNORMAL LOW (ref 36.0–46.0)
Hemoglobin: 10.9 g/dL — ABNORMAL LOW (ref 12.0–15.0)
MCH: 26.6 pg (ref 26.0–34.0)
MCHC: 31.8 g/dL (ref 30.0–36.0)
MCV: 83.7 fL (ref 78.0–100.0)
Platelets: 336 10*3/uL (ref 150–400)
RBC: 4.1 MIL/uL (ref 3.87–5.11)
RDW: 14 % (ref 11.5–15.5)
WBC: 14.6 10*3/uL — ABNORMAL HIGH (ref 4.0–10.5)

## 2015-10-23 LAB — PROTIME-INR
INR: 2.94 — ABNORMAL HIGH (ref 0.00–1.49)
Prothrombin Time: 30.2 seconds — ABNORMAL HIGH (ref 11.6–15.2)

## 2015-10-23 LAB — HEPARIN LEVEL (UNFRACTIONATED)
Heparin Unfractionated: 0.94 IU/mL — ABNORMAL HIGH (ref 0.30–0.70)
Heparin Unfractionated: 1.58 IU/mL — ABNORMAL HIGH (ref 0.30–0.70)

## 2015-10-23 MED ORDER — HEPARIN (PORCINE) IN NACL 100-0.45 UNIT/ML-% IJ SOLN
1400.0000 [IU]/h | INTRAMUSCULAR | Status: DC
  Administered 2015-10-23: 1500 [IU]/h via INTRAVENOUS
  Filled 2015-10-23: qty 250

## 2015-10-23 NOTE — Progress Notes (Signed)
Pt is non-compliant with CPAP at home. Reported to me that patient refuses. RN explained patient had been very upset and argumentative. RT did not wake patient.

## 2015-10-23 NOTE — Progress Notes (Signed)
S: Appetite marginal, doesn'Walker like the diet.  CP better O:BP 154/72 mmHg  Pulse 67  Temp(Src) 97.7 F (36.5 C) (Oral)  Resp 19  Ht 5\' 3"  (1.6 m)  Wt 103.828 kg (228 lb 14.4 oz)  BMI 40.56 kg/m2  SpO2 97%  Intake/Output Summary (Last 24 hours) at 10/23/15 0716 Last data filed at 10/23/15 0424  Gross per 24 hour  Intake 437.05 ml  Output      0 ml  Net 437.05 ml   Weight change: -3.901 kg (-8 lb 9.6 oz) [id number] and alert CVS:RRR Resp:clear Abd:+ BS NTND No HSM Ext: tr edema NEURO:CNI Ox3 no asterixis   . amLODipine  10 mg Oral Daily  .  morphine injection  4 mg Intravenous Once  . nebivolol  10 mg Oral Daily  . sodium chloride  3 mL Intravenous Q12H  . venlafaxine XR  37.5 mg Oral Q breakfast  . warfarin   Does not apply Once  . Warfarin - Pharmacist Dosing Inpatient   Does not apply q1800   No results found. BMET    Component Value Date/Time   NA 133* 10/21/2015 0111   NA 139 01/11/2015 1520   K 4.1 10/21/2015 0111   K 4.4 01/11/2015 1520   CL 106 10/21/2015 0111   CL 111* 12/04/2012 1056   CO2 17* 10/21/2015 0111   CO2 21* 01/11/2015 1520   GLUCOSE 119* 10/21/2015 0111   GLUCOSE 92 01/11/2015 1520   GLUCOSE 97 12/04/2012 1056   BUN 54* 10/21/2015 0111   BUN 24.5 01/11/2015 1520   CREATININE 3.40* 10/21/2015 0111   CREATININE 1.9* 01/11/2015 1520   CALCIUM 9.5 10/21/2015 0111   CALCIUM 9.2 01/11/2015 1520   GFRNONAA 14* 10/21/2015 0111   GFRAA 16* 10/21/2015 0111   CBC    Component Value Date/Time   WBC 17.1* 10/22/2015 0006   WBC 13.6* 08/14/2015 1137   RBC 4.12 10/22/2015 0006   RBC 4.23 08/14/2015 1137   HGB 11.0* 10/22/2015 0006   HGB 11.5* 08/14/2015 1137   HCT 33.9* 10/22/2015 0006   HCT 36.6 08/14/2015 1137   PLT 289 10/22/2015 0006   PLT 224 08/14/2015 1137   MCV 82.3 10/22/2015 0006   MCV 86.5 08/14/2015 1137   MCH 26.7 10/22/2015 0006   MCH 27.2 08/14/2015 1137   MCHC 32.4 10/22/2015 0006   MCHC 31.4* 08/14/2015 1137   RDW 13.6  10/22/2015 0006   RDW 14.9* 08/14/2015 1137   LYMPHSABS 3.0 08/14/2015 1137   LYMPHSABS 3.6 12/07/2009 0545   MONOABS 0.9 08/14/2015 1137   MONOABS 0.6 12/07/2009 0545   EOSABS 0.2 08/14/2015 1137   EOSABS 0.1 12/07/2009 0545   BASOSABS 0.0 08/14/2015 1137   BASOSABS 0.0 12/07/2009 0545     Assessment:  1.  Acute on CKD 3, Scr trending down.  UO not recorded.  SPEP pend 2. Mild hyponatremia 3. PE/DVT on anticoagulation 4. HTN  Plan: 1. Will liberalize diet 2. Daily Scr 3. OK to go from renal standpoint when anticoagulation adequate   Whitney Walker

## 2015-10-23 NOTE — Progress Notes (Signed)
ANTICOAGULATION CONSULT NOTE - Follow Up Consult  Pharmacy Consult:  Heparin / Coumadin (Overlap D#7/5) Indication:  New PE + left common femoral vein DVT  Allergies  Allergen Reactions  . Iohexol Other (See Comments)     Code: HIVES, Desc: HIVES W/ "DYE" USED FOR 1ST CT SCAN BUT NOT 2ND, NO PREMEDS USED, PT UNCERTAIN OF CIRCUMSTANCES,,?POSSIBLE MRI CONTRAST ALLERGY, ALL STUDIES DONE "SOMEWHERE" IN [place]//A.C., Onset Date: [id number]     Patient Measurements: Height: 5\' 3"  (160 cm) Weight: 228 lb 14.4 oz (103.828 kg) IBW/kg (Calculated) : 52.4 Heparin Dosing Weight: 79 kg  Vital Signs: Temp: 97.9 F (36.6 C) (10/24 0900) Temp Source: Oral (10/24 0900) BP: 140/90 mmHg (10/24 1020) Pulse Rate: 61 (10/24 0900)  Labs:  Recent Labs  10/21/15 0111  10/21/15 1900 10/22/15 0006 10/23/15 0345  HGB 10.6*  --   --  11.0* 10.9*  HCT 32.8*  --   --  33.9* 34.3*  PLT 256  --   --  289 336  LABPROT 17.3*  --   --  21.6* 30.2*  INR 1.41  --   --  1.89* 2.94*  HEPARINUNFRC 0.37  < > 0.72* 0.48 0.94*  CREATININE 3.40*  --   --   --  2.89*  < > = values in this interval not displayed.  Estimated Creatinine Clearance: 23.9 mL/min (by C-G formula based on Cr of 2.89).     Assessment: 26 YOF with new PE and DVT to continue on IV heparin and Coumadin.  Heparin level is supratherapeutic and INR is at upper end of desired range. INR with big jump o/n 2.89 < - 1.89, HL elevated at 0.94 and was drawn correctly per RN.  Goal of Therapy:  Heparin level 0.3-0.7 units/ml  INR 2 - 3 Monitor platelets by anticoagulation protocol: Yes    Plan:  - Reduce heparin rate to 1900 units/hr - HL in 8 hours - If INR therapeutic tom can stop hep - No warfarin tonight due to increase in INR over last 24 hours - Daily HL / CBC / PT / INR - F/U resume home med ASA  Vincenza Hews, PharmD, BCPS 10/23/2015, 12:56 PM Pager: [phone]

## 2015-10-23 NOTE — Progress Notes (Signed)
ANTICOAGULATION CONSULT NOTE - Follow Up Consult  Pharmacy Consult for Heparin Indication: New PE + left common femoral vein DVT  Allergies  Allergen Reactions  . Iohexol Other (See Comments)     Code: HIVES, Desc: HIVES W/ "DYE" USED FOR 1ST CT SCAN BUT NOT 2ND, NO PREMEDS USED, PT UNCERTAIN OF CIRCUMSTANCES,,?POSSIBLE MRI CONTRAST ALLERGY, ALL STUDIES DONE "SOMEWHERE" IN [place]//A.C., Onset Date: [id number]     Patient Measurements: Height: 5\' 3"  (160 cm) Weight: 228 lb 14.4 oz (103.828 kg) IBW/kg (Calculated) : 52.4 Heparin Dosing Weight: 79 kg  Vital Signs: Temp: 98.1 F (36.7 C) (10/24 2005) Temp Source: Oral (10/24 2005) BP: 154/69 mmHg (10/24 2005) Pulse Rate: 74 (10/24 2005)  Labs:  Recent Labs  10/21/15 0111  10/22/15 0006 10/23/15 0345 10/23/15 1949  HGB 10.6*  --  11.0* 10.9*  --   HCT 32.8*  --  33.9* 34.3*  --   PLT 256  --  289 336  --   LABPROT 17.3*  --  21.6* 30.2*  --   INR 1.41  --  1.89* 2.94*  --   HEPARINUNFRC 0.37  < > 0.48 0.94* 1.58*  CREATININE 3.40*  --   --  2.89*  --   < > = values in this interval not displayed.  Estimated Creatinine Clearance: 23.9 mL/min (by C-G formula based on Cr of 2.89).  Assessment: 79 YOF with new PE and DVT to continue on IV heparin and Coumadin. For the 2nd time, heparin level is supratherapeutic 0.94>1.58 despite dose decrease.  Goal of Therapy:  Heparin level 0.3-0.7 units/ml Monitor platelets by anticoagulation protocol: Yes   Plan:  Hold IV heparin x 1 hour. Then resume IV heparin at 1500 units/hr (19 units/kg/hr) Daily HL, CBC, INR    Jenasia Dolinar S. Alford Highland, PharmD, BCPS Clinical Staff Pharmacist Pager [phone]  [place], [place] 10/23/2015,9:07 PM

## 2015-10-23 NOTE — Progress Notes (Signed)
PROGRESS NOTE  Whitney Walker [id number] DOB: 07-28-1956 DOA: 10/17/2015 PCP: Whitney Greenland, MD    HPI: Whitney Walker is a 59 y/o female with PMH of CKD Stage 4, chronic diastolic heart failure, HTN, cirrhosis, CAD, PAD, depression and prior PE admitted 10/17 after presenting to the ED with chest pain and SOB that had been ongoing x 3 days. VQ scan at that time showed high probability of PE in the left lung. She has prior history of pulmonary embolism in 2014 following a surgery and was placed on Xarelto on that time. She has not been on chronic anticoagulation medications for several months now. Lower extremity duplex performed 10/19 with DVT in Left CFV.   Subjective / 24 H Interval events - Stable overnight, denies CP, SOB this AM  - Mood is somewhat improved, she states she is very anxious about what's going to happen when she goes home   Assessment/Plan: Principal Problem:   MDD (major depressive disorder), recurrent severe, without psychosis ([place]) Active Problems:   Leucocytosis   Pulmonary embolism (HCC)   CKD (chronic kidney disease) stage 4, GFR 15-29 ml/min (HCC)   Chronic diastolic heart failure (HCC)   HTN (hypertension)   DVT (deep venous thrombosis) (HCC)   Depression   Acute on Chronic Kidney Disease Stage 4  - creatinine continuing to improve, 2.89 today  - UA performed 10/20 with granular casts, proteinuria at >300, negative for UTI  - Renal US 10/20 with no evidence of hydronephrosis or obstruction - making good urine  - unclear trigger, no contrasted studies recently, occasional Ibuprofen use at home for right flank pain 2-3 doses, possible hypotension at home  - [name] baseline 1.7-1.9  - nephrology consulted and clear patient for discharge - home once INR 2-3 AND 24h on heparin overlap  Pulmonary Embolism and LLE DVT - Patient presented to ED 10/18 with complaints of SOB and chest pain. V/Q scan at that time revealed high probability for pulmonary  embolism in the left lung. - Bilateral LE duplex performed 10/20 positive for DVT in L CFV - patient with prior PE in 2014 for which she was anticoagulated with Xarelto for a year, now off of anticoagulation, this is her second episode, she will need life long anticoagulation  - unable to use NOACs due to renal failure - INR today 2.94 up from 1.89 yesterday, will hold coumadin tonight per pharmacy and recheck tomorrow AM  - Therapeutic INR, overlap Heparin and Coumadin x 1 day, likely will discharge tomorrow - Patient has lack of transportation, discussed with case worker for possible home health INR checks   Depression  - Patient has history of depression, not currently on any antidepressant medications at home  - psychiatry consulted 10/20 - started Effexor XR 37.5 mg   HTN  - BP stable, continue current regimen  Chronic Diastolic Heart Failure - Last echo performed 10/2013 - EF 65-70% with Grade I diastolic dysfunction  - appears euvolemic at this time   COPD - stable, no wheezing, albuterol prn   Diet: Diet Heart Room service appropriate?: Yes; Fluid consistency:: Thin Fluids: None DVT Prophylaxis: Anticoagulated with Heparin/Coumadin  Code Status: DNR Family Communication: Daughter and granddaughter present at bedside  Disposition Plan: TBD, likely home tomorrow  Barriers to discharge: Coumadin bridge  Consultants:  Nephrology   Psychiatry  Procedures:  None    Antibiotics  Anti-infectives    None       Studies  No results found.  Objective  Filed Vitals:   10/22/15 1935 10/23/15 0424 10/23/15 0900 10/23/15 1020  BP: 137/56 154/72 130/57 140/90  Pulse: 63 67 61   Temp: 98.2 F (36.8 C) 97.7 F (36.5 C) 97.9 F (36.6 C)   TempSrc: Oral Oral Oral   Resp: 19 19 16    Height:      Weight:  103.828 kg (228 lb 14.4 oz)    SpO2: 96% 97% 97%     Intake/Output Summary (Last 24 hours) at 10/23/15 1102 Last data filed at 10/23/15 1033  Gross per  24 hour  Intake 1003.05 ml  Output      0 ml  Net 1003.05 ml   Filed Weights   10/21/15 0448 10/22/15 0458 10/23/15 0424  Weight: 105.28 kg (232 lb 1.6 oz) 107.729 kg (237 lb 8 oz) 103.828 kg (228 lb 14.4 oz)   Exam:  GENERAL: NAD  HEENT: head NCAT, no scleral icterus.  Mucous membranes are moist.   LUNGS: Breath sounds clear and equal bilaterally. No rales, rhonchi or wheezes.  HEART: Regular rate and rhythm without murmur. 2+ pulses, no JVD, bilateral LE edema, worse on the left   ABDOMEN: Soft, non-distended, non-tender. Positive bowel sounds.  EXTREMITIES: Without any cyanosis or clubbing. Good muscle tone  PSYCHIATRIC: Depressed and anxious mood.   Data Reviewed: Basic Metabolic Panel:  Recent Labs Lab 10/17/15 1600 10/19/15 0359 10/20/15 0330 10/21/15 0111 10/23/15 0345  NA 140 132* 132* 133* 138  K 4.6 4.4 4.4 4.1 4.3  CL 114* 102 102 106 114*  CO2 17* 21* 20* 17* 17*  GLUCOSE 122* 140* 105* 119* 114*  BUN 25* 47* 53* 54* 48*  CREATININE 2.32* 3.93* 4.09* 3.40* 2.89*  CALCIUM 10.0 9.4 9.2 9.5 9.8  PHOS  --   --  5.8*  --  4.7*   Liver Function Tests:  Recent Labs Lab 10/17/15 1600 10/20/15 0330 10/23/15 0345  AST 27  --   --   ALT 21  --   --   ALKPHOS 91  --   --   BILITOT 0.6  --   --   PROT 8.0  --   --   ALBUMIN 2.6* 2.2* 2.2*    Recent Labs Lab 10/17/15 1550  LIPASE 22   CBC:  Recent Labs Lab 10/19/15 0359 10/20/15 0330 10/21/15 0111 10/22/15 0006 10/23/15 0345  WBC 16.3* 15.3* 17.3* 17.1* 14.6*  HGB 11.0* 10.1* 10.6* 11.0* 10.9*  HCT 35.6* 33.0* 32.8* 33.9* 34.3*  MCV 85.8 85.7 83.0 82.3 83.7  PLT 249 249 256 289 336   BNP (last 3 results)  Recent Labs  10/17/15 1600  BNP 115.8*   Scheduled Meds: . amLODipine  10 mg Oral Daily  .  morphine injection  4 mg Intravenous Once  . nebivolol  10 mg Oral Daily  . sodium chloride  3 mL Intravenous Q12H  . venlafaxine XR  37.5 mg Oral Q breakfast  . warfarin   Does not  apply Once  . Warfarin - Pharmacist Dosing Inpatient   Does not apply q1800   Continuous Infusions: . heparin 2,100 Units/hr (10/22/15 2128)    Whitney Boone, PA-S  Whitney Board, MD [facility] Pager [phone]. If 7 PM - 7 AM, please contact night-coverage at [email or web address], password [facility] 10/23/2015, 11:02 AM  LOS: 6 days

## 2015-10-23 NOTE — Consult Note (Signed)
[facility] Psychiatry Consult   Reason for Consult:  Depression medication management Referring Physician:  Dr. Cruzita Lederer Patient Identification: Whitney Walker MRN:  [id number] Principal Diagnosis: MDD (major depressive disorder), recurrent severe, without psychosis ([place]) Diagnosis:   Patient Active Problem List   Diagnosis Date Noted  . MDD (major depressive disorder), recurrent severe, without psychosis ([place]) [F33.2] 10/19/2015  . HTN (hypertension) [I10] 10/18/2015  . DVT (deep venous thrombosis) ([place]) [I82.409] 10/18/2015  . Depression [F32.9] 10/18/2015  . Pain, joint, lower leg, left [M25.562] 01/24/2015  . Numbness and tingling of left arm and leg [R20.2] 01/24/2015  . Aftercare following surgery of the circulatory system, [place] [Z48.812] 07/19/2014  . PVD (peripheral vascular disease) with claudication ([place]) [I73.9] 03/23/2014  . Swelling of limb-Left leg [M79.89] 03/23/2014  . Weakness-Left arm/Leg [R53.1] 03/23/2014  . PAD (peripheral artery disease) ([place]) [I73.9] 12/14/2013  . CKD (chronic kidney disease) stage 4, GFR 15-29 ml/min (HCC) [N18.4] 11/15/2013  . Chronic diastolic heart failure ([place]) [I50.32] 11/15/2013  . Unspecified essential hypertension [I10] 11/15/2013  . Pulmonary embolism ([place]) [I26.99] 11/14/2013  . Pain in limb [M79.609] 07/30/2013  . Atherosclerosis of native arteries of the extremities with ulceration(440.23) [I73.9, L98.499] 07/30/2013  . Cirrhosis ([place]) [M46.80] 05/21/2012  . Leucocytosis [D72.829] 04/28/2012  . Colon cancer ([place]) [C18.9] 04/28/2012    Total Time spent with patient: 1 hour  Subjective:   Whitney Walker is a 59 y.o. female patient admitted with chest pain and SOB.  HPI:  Whitney Walker is a 59 y/o female seen, chart reviewed for face-to-face psychiatry consultation and evaluation of increased symptoms of depression, loss of interest, decreased energy, disturbance of sleep and appetite, sleep apnea and plenty of other  medical problems and passive suicidal ideation. Patient reported she has been living by herself and has few friends and family members and contact with her. Patient reported she has a lot of stresses on her plate and has nobody to talk to. Patient reportedly received a psychiatric medication management about 2 years ago from primary care physician and currently has no known antidepressant medication treatment or outpatient psychiatric services. Patient is willing to give a trial of antidepressant medication and also follow-up with outpatient medication management and counseling services when medically stable from the hospital. Patient stated that she has passive suicidal ideation when she is thinking about all her stressors and family members and trauma also one of her cousin was monitored in front of her daughter about 7 years ago in [place]. Patient has no active suicidal ideation, intention or plans and contract for safety while in the hospital.  Medical history: Patient with PMH of CKD Stage 4, chronic diastolic heart failure, HTN, cirrhosis, CAD, PAD, depression and prior PE admitted 10/17 after presenting to the ED with chest pain and SOB that had been ongoing x 3 days. VQ scan at that time showed high probability of PE in the left lung. She has prior history of pulmonary embolism in 2014 following a surgery and was placed on Xarelto on that time. She has not been on chronic anticoagulation medications for several months now. Lower extremity duplex performed 10/19 with DVT in Left CFV.   Past Psychiatric History:  Patient has been diagnosed with major depressive disorder, recurrent without psychotic features and was on medication zoloft, celexa and lexapro from a PCP but not helpful. She has no current out patient treatment. Patient reportedly was treated inpatient psychiatric hospitalization when living in [place].   Risk to Self: Is patient  at risk for suicide?: Yes Risk to Others:   Prior  Inpatient Therapy:   Prior Outpatient Therapy:    Past Medical History:  Past Medical History  Diagnosis Date  . Anxiety   . Arthritis   . COPD (chronic obstructive pulmonary disease) ([place])   . Depression   . Hypertension   . Meningitis   . Vertigo   . Colon cancer ([place])   . Peripheral vascular disease ([place])   . CKD (chronic kidney disease)     Stage III 03/2013  . Shortness of breath   . CHF (congestive heart failure) ([place])   . PE (pulmonary embolism)   . Coronary artery disease   . Sleep apnea   . Anemia     Past Surgical History  Procedure Laterality Date  . Cesarean section      X 3 [id number]  . Cholecystectomy    . Abdominal hysterectomy    . Diverticulosis surgery-2002  2012  . Colectomy  2010  . Cholecystectomy  1980/s  . Femoral-popliteal bypass graft Left 08/13/2013    Procedure: BYPASS GRAFT FEMORAL-POPLITEAL ARTERY WITH NON-REVERSED SAPHANEOUS VEIN; ULTRASOUND GUIDED;  Surgeon: Mal [name], MD;  Location: [facility];  Service: Vascular;  Laterality: Left;  . Abdominal aortagram N/A 07/26/2013    Procedure: ABDOMINAL Maxcine Ham;  Surgeon: [name] Cisco, MD;  Location: [facility] CATH LAB;  Service: Cardiovascular;  Laterality: N/A;  . Lower extremity angiogram Left 07/26/2013    Procedure: LOWER EXTREMITY ANGIOGRAM;  Surgeon: [name] Cibecue, MD;  Location: [facility] CATH LAB;  Service: Cardiovascular;  Laterality: Left;   Family History:  Family History  Problem Relation Age of Onset  . Cancer Mother 24    breast and bone  . Cancer Father 71    prostate  . Cancer Cousin 20    breast cancer   . Hypertension Sister   . Bleeding Disorder Sister   . Hypertension Daughter    Family Psychiatric  History: Patient daughter has been suffering with severe mental illness and was admitted to inpatient psychiatric hospital.  Social History:  History  Alcohol Use No    Comment: she used to drink alcohol, quit in 2010      History  Drug Use No    Social History   Social History  .  Marital Status: Single    Spouse Name: N/A  . Number of Children: 4  . Years of Education: 12th gd   Occupational History  . N/A    Social History Main Topics  . Smoking status: Former Smoker -- 1.50 packs/day for 40 years    Types: Cigarettes    Quit date: 08/09/2014  . Smokeless tobacco: Never Used  . Alcohol Use: No     Comment: she used to drink alcohol, quit in 2010   . Drug Use: No  . Sexual Activity: Not Asked   Other Topics Concern  . None   Social History Narrative   2 cups of coffee a day    Additional Social History:                          Allergies:   Allergies  Allergen Reactions  . Iohexol Other (See Comments)     Code: HIVES, Desc: HIVES W/ "DYE" USED FOR 1ST CT SCAN BUT NOT 2ND, NO PREMEDS USED, PT UNCERTAIN OF CIRCUMSTANCES,,?POSSIBLE MRI CONTRAST ALLERGY, ALL STUDIES DONE "SOMEWHERE" IN [place]//A.C., Onset Date: [id number]     Labs:  Results for orders placed or performed during the hospital encounter of 10/17/15 (from the past 48 hour(s))  Heparin level (unfractionated)     Status: Abnormal   Collection Time: 10/21/15  7:00 PM  Result Value Ref Range   Heparin Unfractionated 0.72 (H) 0.30 - 0.70 IU/mL    Comment:        IF HEPARIN RESULTS ARE BELOW EXPECTED VALUES, AND PATIENT DOSAGE HAS BEEN CONFIRMED, SUGGEST FOLLOW UP TESTING OF ANTITHROMBIN III LEVELS.   Heparin level (unfractionated)     Status: None   Collection Time: 10/22/15 12:06 AM  Result Value Ref Range   Heparin Unfractionated 0.48 0.30 - 0.70 IU/mL    Comment:        IF HEPARIN RESULTS ARE BELOW EXPECTED VALUES, AND PATIENT DOSAGE HAS BEEN CONFIRMED, SUGGEST FOLLOW UP TESTING OF ANTITHROMBIN III LEVELS.   CBC     Status: Abnormal   Collection Time: 10/22/15 12:06 AM  Result Value Ref Range   WBC 17.1 (H) 4.0 - 10.5 K/uL   RBC 4.12 3.87 - 5.11 MIL/uL   Hemoglobin 11.0 (L) 12.0 - 15.0 g/dL   HCT 33.9 (L) 36.0 - 46.0 %   MCV 82.3 78.0 - 100.0 fL   MCH  26.7 26.0 - 34.0 pg   MCHC 32.4 30.0 - 36.0 g/dL   RDW 13.6 11.5 - 15.5 %   Platelets 289 150 - 400 K/uL  Protime-INR     Status: Abnormal   Collection Time: 10/22/15 12:06 AM  Result Value Ref Range   Prothrombin Time 21.6 (H) 11.6 - 15.2 seconds   INR 1.89 (H) 0.00 - 1.49  CBC     Status: Abnormal   Collection Time: 10/23/15  3:45 AM  Result Value Ref Range   WBC 14.6 (H) 4.0 - 10.5 K/uL   RBC 4.10 3.87 - 5.11 MIL/uL   Hemoglobin 10.9 (L) 12.0 - 15.0 g/dL   HCT 34.3 (L) 36.0 - 46.0 %   MCV 83.7 78.0 - 100.0 fL   MCH 26.6 26.0 - 34.0 pg   MCHC 31.8 30.0 - 36.0 g/dL   RDW 14.0 11.5 - 15.5 %   Platelets 336 150 - 400 K/uL  Heparin level (unfractionated)     Status: Abnormal   Collection Time: 10/23/15  3:45 AM  Result Value Ref Range   Heparin Unfractionated 0.94 (H) 0.30 - 0.70 IU/mL    Comment:        IF HEPARIN RESULTS ARE BELOW EXPECTED VALUES, AND PATIENT DOSAGE HAS BEEN CONFIRMED, SUGGEST FOLLOW UP TESTING OF ANTITHROMBIN III LEVELS.   Renal function panel     Status: Abnormal   Collection Time: 10/23/15  3:45 AM  Result Value Ref Range   Sodium 138 135 - 145 mmol/L   Potassium 4.3 3.5 - 5.1 mmol/L   Chloride 114 (H) 101 - 111 mmol/L   CO2 17 (L) 22 - 32 mmol/L   Glucose, Bld 114 (H) 65 - 99 mg/dL   BUN 48 (H) 6 - 20 mg/dL   Creatinine, Ser 2.89 (H) 0.44 - 1.00 mg/dL   Calcium 9.8 8.9 - 10.3 mg/dL   Phosphorus 4.7 (H) 2.5 - 4.6 mg/dL   Albumin 2.2 (L) 3.5 - 5.0 g/dL   GFR calc non Af Amer 17 (L) >60 mL/min   GFR calc Af Amer 19 (L) >60 mL/min    Comment: (NOTE) The eGFR has been calculated using the CKD EPI equation. This calculation has not been validated in all clinical  situations. eGFR's persistently <60 mL/min signify possible Chronic Kidney Disease.    Anion gap 7 5 - 15  Protime-INR     Status: Abnormal   Collection Time: 10/23/15  3:45 AM  Result Value Ref Range   Prothrombin Time 30.2 (H) 11.6 - 15.2 seconds   INR 2.94 (H) 0.00 - 1.49     Current Facility-Administered Medications  Medication Dose Route Frequency Provider Last Rate Last Dose  . acetaminophen (TYLENOL) tablet 650 mg  650 mg Oral Q6H PRN Gardiner Barefoot, NP   650 mg at 10/21/15 2159  . amLODipine (NORVASC) tablet 10 mg  10 mg Oral Daily Etta Quill, DO   10 mg at 10/23/15 1021  . heparin ADULT infusion 100 units/mL (25000 units/250 mL)  2,100 Units/hr Intravenous Continuous Tyrone Apple, RPH 21 mL/hr at 10/22/15 2128 2,100 Units/hr at 10/22/15 2128  . HYDROmorphone (DILAUDID) injection 1 mg  1 mg Intravenous Q3H PRN Gardiner Barefoot, NP   1 mg at 10/20/15 0216  . ipratropium-albuterol (DUONEB) 0.5-2.5 (3) MG/3ML nebulizer solution 3 mL  3 mL Nebulization Q6H PRN Costin Karlyne Greenspan, MD      . morphine 4 MG/ML injection 4 mg  4 mg Intravenous Once Deno Etienne, DO      . nebivolol (BYSTOLIC) tablet 10 mg  10 mg Oral Daily Etta Quill, DO   10 mg at 10/23/15 1020  . nitroGLYCERIN (NITROSTAT) SL tablet 0.4 mg  0.4 mg Sublingual Q5 min PRN Deno Etienne, DO      . ondansetron [facility]) injection 4 mg  4 mg Intravenous Q6H PRN Gardiner Barefoot, NP   4 mg at 10/17/15 2228  . oxyCODONE (Oxy IR/ROXICODONE) immediate release tablet 5 mg  5 mg Oral Q6H PRN Gardiner Barefoot, NP   5 mg at 10/18/15 0501  . sodium chloride 0.9 % injection 3 mL  3 mL Intravenous Q12H Etta Quill, DO   3 mL at 10/22/15 2115  . technetium TC 63M diethylenetriame-pentaacetic acid (DTPA) injection 30 milli Curie  30 milli Curie Intravenous Once PRN Medication Radiologist, MD      . venlafaxine XR (EFFEXOR-XR) 24 hr capsule 37.5 mg  37.5 mg Oral Q breakfast Ambrose Finland, MD   37.5 mg at 10/23/15 1023  . warfarin (COUMADIN) video   Does not apply Once Franky Macho, RPH      . Warfarin - Pharmacist Dosing Inpatient   Does not apply q1800 Etta Quill, DO   0  at 10/18/15 1800    Musculoskeletal: Strength & Muscle Tone: decreased Gait & Station: unable to  stand Patient leans: N/A  Psychiatric Specialty Exam: ROS SOB and chest pain, depression and feels overwhelmed. No Fever-chills, No Headache, No changes with Vision or hearing, reports vertigo No problems swallowing food or Liquids, No Chest pain, Cough or Shortness of Breath, No Abdominal pain, No Nausea or Vommitting, Bowel movements are regular, No Blood in stool or Urine, No dysuria, No new skin rashes or bruises, No new joints pains-aches,  No new weakness, tingling, numbness in any extremity, No recent weight gain or loss, No polyuria, polydypsia or polyphagia,   A full 10 point Review of Systems was done, except as stated above, all other Review of Systems were negative.  Blood pressure 140/90, pulse 61, temperature 97.9 F (36.6 C), temperature source Oral, resp. rate 16, height 5' 3" (1.6 m), weight 103.828 kg (228 lb 14.4 oz), SpO2 97 %.Body mass index is  40.56 kg/(m^2).  General Appearance: Casual  Eye Contact::  Good  Speech:  Clear and Coherent  Volume:  Decreased  Mood:  Anxious and Depressed  Affect:  Constricted and Depressed  Thought Process:  Coherent and Goal Directed  Orientation:  Full (Time, Place, and Person)  Thought Content:  WDL  Suicidal Thoughts:  No  Homicidal Thoughts:  No  Memory:  Immediate;   Good Recent;   Good Remote;   Fair  Judgement:  Intact  Insight:  Fair  Psychomotor Activity:  Decreased  Concentration:  Fair  Recall:  [employer] of Knowledge:Good  Language: Good  Akathisia:  Negative  Handed:  Right  AIMS (if indicated):     Assets:  Communication Skills Desire for Improvement Financial Resources/Insurance Housing Leisure Time Resilience Social Support  ADL's:  Impaired  Cognition: WNL  Sleep:      Treatment Plan Summary: Daily contact with patient to assess and evaluate symptoms and progress in treatment and Medication management  Will start Effexor XR 37.5 mg PO Qam with breakfast starting today which can be  increased to 75 mg if tolerated well  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  Disposition: Patient does not meet criteria for psychiatric inpatient admission. Supportive therapy provided about ongoing stressors.  Julee Stoll,JANARDHAHA R. 10/23/2015 11:00 AM

## 2015-10-23 NOTE — Progress Notes (Signed)
Patient refused BIPAP/CPAP at this time.  Patient states she is going home tomorrow and would prefer to wear her oxygen at this time as she is frequently getting OOB to go to the bathroom.  Patient encouraged to call for Respiratory is she wishes to use CPAP during her hospital stay.

## 2015-10-23 NOTE — Progress Notes (Signed)
Pt current PT level = 30.2                  INR level = 2.94  result given by lab.

## 2015-10-24 LAB — RENAL FUNCTION PANEL
Albumin: 2.4 g/dL — ABNORMAL LOW (ref 3.5–5.0)
Anion gap: 8 (ref 5–15)
BUN: 49 mg/dL — ABNORMAL HIGH (ref 6–20)
CO2: 18 mmol/L — ABNORMAL LOW (ref 22–32)
Calcium: 9.8 mg/dL (ref 8.9–10.3)
Chloride: 109 mmol/L (ref 101–111)
Creatinine, Ser: 3.08 mg/dL — ABNORMAL HIGH (ref 0.44–1.00)
GFR calc Af Amer: 18 mL/min — ABNORMAL LOW (ref >60)
GFR calc non Af Amer: 15 mL/min — ABNORMAL LOW (ref >60)
Glucose, Bld: 103 mg/dL — ABNORMAL HIGH (ref 65–99)
Phosphorus: 5.7 mg/dL — ABNORMAL HIGH (ref 2.5–4.6)
Potassium: 5 mmol/L (ref 3.5–5.1)
Sodium: 135 mmol/L (ref 135–145)

## 2015-10-24 LAB — PROTIME-INR
INR: 2.87 — ABNORMAL HIGH (ref 0.00–1.49)
Prothrombin Time: 29.6 seconds — ABNORMAL HIGH (ref 11.6–15.2)

## 2015-10-24 LAB — PROTEIN ELECTROPHORESIS, SERUM
A/G Ratio: 0.5 — ABNORMAL LOW (ref 0.7–1.7)
Albumin ELP: 2.4 g/dL — ABNORMAL LOW (ref 2.9–4.4)
Alpha-1-Globulin: 0.4 g/dL (ref 0.0–0.4)
Alpha-2-Globulin: 1.3 g/dL — ABNORMAL HIGH (ref 0.4–1.0)
Beta Globulin: 1.5 g/dL — ABNORMAL HIGH (ref 0.7–1.3)
Gamma Globulin: 1.4 g/dL (ref 0.4–1.8)
Globulin, Total: 4.5 g/dL — ABNORMAL HIGH (ref 2.2–3.9)
M-Spike, %: 0.2 g/dL — ABNORMAL HIGH
Total Protein ELP: 6.9 g/dL (ref 6.0–8.5)

## 2015-10-24 LAB — CBC
HCT: 36.5 % (ref 36.0–46.0)
Hemoglobin: 11.5 g/dL — ABNORMAL LOW (ref 12.0–15.0)
MCH: 26.4 pg (ref 26.0–34.0)
MCHC: 31.5 g/dL (ref 30.0–36.0)
MCV: 83.9 fL (ref 78.0–100.0)
Platelets: 346 10*3/uL (ref 150–400)
RBC: 4.35 MIL/uL (ref 3.87–5.11)
RDW: 14.1 % (ref 11.5–15.5)
WBC: 13.1 10*3/uL — ABNORMAL HIGH (ref 4.0–10.5)

## 2015-10-24 LAB — HEPARIN LEVEL (UNFRACTIONATED): Heparin Unfractionated: 0.73 IU/mL — ABNORMAL HIGH (ref 0.30–0.70)

## 2015-10-24 MED ORDER — SODIUM BICARBONATE 650 MG PO TABS
650.0000 mg | ORAL_TABLET | Freq: Two times a day (BID) | ORAL | Status: DC
  Administered 2015-10-24 – 2015-10-25 (×3): 650 mg via ORAL
  Filled 2015-10-24 (×3): qty 1

## 2015-10-24 MED ORDER — WARFARIN SODIUM 5 MG PO TABS
5.0000 mg | ORAL_TABLET | Freq: Once | ORAL | Status: AC
  Administered 2015-10-24: 5 mg via ORAL
  Filled 2015-10-24: qty 1

## 2015-10-24 NOTE — Progress Notes (Signed)
Physical Therapy Treatment Patient Details Name: DARICA DELILLO MRN: [id number] DOB: April 22, 1956 Today's Date: 10/24/2015    History of Present Illness Ms. Ellingboe is a 59 y/o female with PMH of CKD Stage 4, chronic diastolic heart failure, HTN, cirrhosis, CAD, PAD, depression and prior PE admitted 10/17 after presenting to the ED with chest pain and SOB that had been ongoing x 3 days.  VQ scan shows high probability for new PE.  Heparin started.    PT Comments    Pt functioning near baseline however remains to become SOB with all activity limited activity tolerance. Encouraged pt to use RW for energy conservation however patient declined. Acute PT to con't to follow to progress indep with mobility.  Follow Up Recommendations  Home health PT (for energy conservation)     Equipment Recommendations  Rolling walker with 5" wheels (recommend RW for energy conservation however PT declined)    Recommendations for Other Services       Precautions / Restrictions Precautions Precaution Comments: SOB with mobility Restrictions Weight Bearing Restrictions: No    Mobility  Bed Mobility Overal bed mobility: Modified Independent                Transfers Overall transfer level: Modified independent               General transfer comment: safe technique but SOB with all activity  Ambulation/Gait Ambulation/Gait assistance: Supervision Ambulation Distance (Feet): 200 Feet Assistive device: None Gait Pattern/deviations: WFL(Within Functional Limits) Gait velocity: wfl Gait velocity interpretation: at or above normal speed for age/gender General Gait Details: + SOB however pt reports this to be normal, no overt episodes of LOB   Stairs Stairs: Yes Stairs assistance: Supervision Stair Management: One rail Right Number of Stairs: 3 General stair comments: alternating with no episodes of LOB  Wheelchair Mobility    Modified Rankin (Stroke Patients Only)        Balance Overall balance assessment: No apparent balance deficits (not formally assessed)                                  Cognition Arousal/Alertness: Awake/alert Behavior During Therapy: WFL for tasks assessed/performed Overall Cognitive Status: Within Functional Limits for tasks assessed (known depression)                      Exercises      General Comments General comments (skin integrity, edema, etc.): pt assisted to bathroom and mod I for hygiene.      Pertinent Vitals/Pain Pain Assessment: No/denies pain    Home Living                      Prior Function            PT Goals (current goals can now be found in the care plan section) Acute Rehab PT Goals Patient Stated Goal: go home Progress towards PT goals: Progressing toward goals    Frequency  Min 3X/week    PT Plan Current plan remains appropriate    Co-evaluation             End of Session   Activity Tolerance: Patient tolerated treatment well Patient left: in bed;with call bell/phone within reach;Other (comment)     Time: [id number] PT Time Calculation (min) (ACUTE ONLY): 19 min  Charges:  $Gait Training: 8-22 mins  G CodesKingsley Callander 10/24/2015, 4:57 PM   Kittie Plater, PT, DPT Pager #: [phone] Office #: [phone]

## 2015-10-24 NOTE — Progress Notes (Signed)
ANTICOAGULATION CONSULT NOTE - Follow Up Consult  Pharmacy Consult for heparin Indication: PE/DVT    Labs:  Recent Labs  10/22/15 0006 10/23/15 0345 10/23/15 1949 10/24/15 0428  HGB 11.0* 10.9*  --  11.5*  HCT 33.9* 34.3*  --  36.5  PLT 289 336  --  346  LABPROT 21.6* 30.2*  --  29.6*  INR 1.89* 2.94*  --  2.87*  HEPARINUNFRC 0.48 0.94* 1.58* 0.73*  CREATININE  --  2.89*  --   --      Assessment: 59yo remains slightly supratherapeutic on heparin after rate decrease.  Goal of Therapy:  Heparin level 0.3-0.7 units/ml   Plan:  Will decrease heparin gtt by 1 unit/kg/hr to 1400 units/hr and check level in [place], PharmD, BCPS  10/24/2015,5:40 AM

## 2015-10-24 NOTE — Care Management Important Message (Signed)
Important Message  Patient Details  Name: Whitney Walker MRN: [id number] Date of Birth: 01-14-1956   Medicare Important Message Given:  Yes-third notification given    Nathen May 10/24/2015, 1:55 PM

## 2015-10-24 NOTE — Progress Notes (Addendum)
PROGRESS NOTE  Whitney Walker [id number] DOB: 01-Oct-1956 DOA: 10/17/2015 PCP: Whitney Greenland, MD   HPI: Whitney Walker is a 59 y/o female with PMH of CKD Stage 4, chronic diastolic heart failure, HTN, cirrhosis, CAD, PAD, depression and prior PE admitted 10/17 after presenting to the ED with chest pain and SOB that had been ongoing x 3 days. VQ scan at that time showed high probability of PE in the left lung. She has prior history of pulmonary embolism in 2014 following a surgery and was placed on Xarelto on that time. She has not been on chronic anticoagulation medications for several months now. Lower extremity duplex performed 10/19 with DVT in Left CFV.   Interim summary: Patient admitted on 10/18 with chest pain, she was found to have a PE and LE DVT. She was started on Coumadin and hepatin gtt. She was on Xarelto in the past however this time her renal failure worsened and was placed on Coumadin. Nephrology is consulted. Her INR is now therapeutic, will be able to be discharged once renal function is improving per nephrology.  Subjective / 24 H Interval events - Stable overnight, denies CP, SOB this AM  - Mood is improved today, she is smiling and affect is improved   Assessment/Plan: Principal Problem:   MDD (major depressive disorder), recurrent severe, without psychosis (Melbeta) Active Problems:   Leucocytosis   Pulmonary embolism (HCC)   CKD (chronic kidney disease) stage 4, GFR 15-29 ml/min (HCC)   Chronic diastolic heart failure (HCC)   HTN (hypertension)   DVT (deep venous thrombosis) (HCC)   Depression   Acute on Chronic Kidney Disease Stage 4  - creatinine 3.08 today up from 2.89 - UA performed 10/20 with granular casts, proteinuria at >300, negative for UTI  - Renal US 10/20 with no evidence of hydronephrosis or obstruction - making good urine  - unclear trigger, no contrasted studies recently, occasional Ibuprofen use at home for right flank pain 2-3 doses,  possible hypotension at home  - [name] baseline 1.7-1.9  - nephrology consulted and want to keep patient here until creatinine trending downwards again - possible discharge tomorrow pending repeat BMP   Pulmonary Embolism and LLE DVT - Patient presented to ED 10/18 with complaints of SOB and chest pain. V/Q scan at that time revealed high probability for pulmonary embolism in the left lung. - Bilateral LE duplex performed 10/20 positive for DVT in L CFV - patient with prior PE in 2014 for which she was anticoagulated with Xarelto for a year, now off of anticoagulation, this is her second episode, she will need life long anticoagulation  - unable to use NOACs due to renal failure  - Patient has lack of transportation, discussed with case worker for possible home health INR checks  - INR today 2.87, therapeutic, heparin dc'ed per pharmacy   Depression  - Patient has history of depression, not currently on any antidepressant medications at home  - psychiatry consulted 10/20 - started Effexor XR 37.5 mg   HTN  - BP stable, continue current regimen  Chronic Diastolic Heart Failure - Last echo performed 10/2013 - EF 65-70% with Grade I diastolic dysfunction  - appears euvolemic at this time   COPD - stable, no wheezing, albuterol prn  Chronic leukocytosis  - followed up as an outpatient   Diet: Diet Heart Room service appropriate?: Yes; Fluid consistency:: Thin Fluids: None DVT Prophylaxis: Anticoagulated with Coumadin  Code Status: DNR Family Communication: No family present at  bedside at this time   Disposition Plan: TBD, likely home tomorrow   Barriers to discharge: Trending creatinine level   Consultants:  Nephrology   Psychiatry  Procedures:  None    Antibiotics  Anti-infectives    None       Studies  No results found.  Objective  Filed Vitals:   10/23/15 1020 10/23/15 1407 10/23/15 2005 10/24/15 0506  BP: 140/90 135/64 154/69 140/76  Pulse:   64 74 70  Temp:  98 F (36.7 C) 98.1 F (36.7 C) 97.3 F (36.3 C)  TempSrc:  Oral Oral Oral  Resp:  15 18 16   Height:      Weight:    103.647 kg (228 lb 8 oz)  SpO2:  96% 98% 94%    Intake/Output Summary (Last 24 hours) at 10/24/15 1002 Last data filed at 10/24/15 0900  Gross per 24 hour  Intake 1119.87 ml  Output      0 ml  Net 1119.87 ml   Filed Weights   10/22/15 0458 10/23/15 0424 10/24/15 0506  Weight: 107.729 kg (237 lb 8 oz) 103.828 kg (228 lb 14.4 oz) 103.647 kg (228 lb 8 oz)    Exam:  GENERAL: NAD  HEENT: head NCAT, no scleral icterus. Mucous membranes are moist.   NECK: Supple. No LAD  LUNGS: Clear to auscultation. No wheezing or crackles  HEART: Regular rate and rhythm without murmur. 2+ pulses, no JVD, bilateral LE edema, worse on the left but improved  ABDOMEN: Soft, non-distended, non-tender. Positive bowel sounds.  EXTREMITIES: Without any cyanosis or clubbing. Good muscle tone  PSYCHIATRIC: Mood and affect improved, smiling and laughing.    Data Reviewed: Basic Metabolic Panel:  Recent Labs Lab 10/19/15 0359 10/20/15 0330 10/21/15 0111 10/23/15 0345 10/24/15 0428  NA 132* 132* 133* 138 135  K 4.4 4.4 4.1 4.3 5.0  CL 102 102 106 114* 109  CO2 21* 20* 17* 17* 18*  GLUCOSE 140* 105* 119* 114* 103*  BUN 47* 53* 54* 48* 49*  CREATININE 3.93* 4.09* 3.40* 2.89* 3.08*  CALCIUM 9.4 9.2 9.5 9.8 9.8  PHOS  --  5.8*  --  4.7* 5.7*   Liver Function Tests:  Recent Labs Lab 10/17/15 1600 10/20/15 0330 10/23/15 0345 10/24/15 0428  AST 27  --   --   --   ALT 21  --   --   --   ALKPHOS 91  --   --   --   BILITOT 0.6  --   --   --   PROT 8.0  --   --   --   ALBUMIN 2.6* 2.2* 2.2* 2.4*    Recent Labs Lab 10/17/15 1550  LIPASE 22   CBC:  Recent Labs Lab 10/20/15 0330 10/21/15 0111 10/22/15 0006 10/23/15 0345 10/24/15 0428  WBC 15.3* 17.3* 17.1* 14.6* 13.1*  HGB 10.1* 10.6* 11.0* 10.9* 11.5*  HCT 33.0* 32.8* 33.9* 34.3* 36.5   MCV 85.7 83.0 82.3 83.7 83.9  PLT 249 256 289 336 346   BNP (last 3 results)  Recent Labs  10/17/15 1600  BNP 115.8*    Scheduled Meds: . amLODipine  10 mg Oral Daily  .  morphine injection  4 mg Intravenous Once  . nebivolol  10 mg Oral Daily  . sodium bicarbonate  650 mg Oral BID  . sodium chloride  3 mL Intravenous Q12H  . venlafaxine XR  37.5 mg Oral Q breakfast  . warfarin  5 mg Oral ONCE-1800  .  Warfarin - Pharmacist Dosing Inpatient   Does not apply q1800   Continuous Infusions:    Elita Boone, PA-S  Marzetta Board, MD [facility] Pager [phone]. If 7 PM - 7 AM, please contact night-coverage at [email or web address], password [facility] 10/24/2015, 10:02 AM  LOS: 7 days

## 2015-10-24 NOTE — Clinical Social Work Note (Signed)
BSW intern met with patient to provide information on transportation services. BSW intern also gave patient a SCAT application. BSW signing off.  Whitney Walker BSW Intern, [phone]

## 2015-10-24 NOTE — Progress Notes (Signed)
S: No new CO O:BP 140/76 mmHg  Pulse 70  Temp(Src) 97.3 F (36.3 C) (Oral)  Resp 16  Ht 5\' 3"  (1.6 m)  Wt 103.647 kg (228 lb 8 oz)  BMI 40.49 kg/m2  SpO2 94%  Intake/Output Summary (Last 24 hours) at 10/24/15 0706 Last data filed at 10/24/15 [id number]  Gross per 24 hour  Intake 1355.87 ml  Output      0 ml  Net 1355.87 ml   Weight change: -0.181 kg (-6.4 oz) [id number] and alert CVS:RRR Resp:clear Abd:+ BS NTND No HSM Ext:  No edema NEURO:CNI Ox3 no asterixis   . amLODipine  10 mg Oral Daily  .  morphine injection  4 mg Intravenous Once  . nebivolol  10 mg Oral Daily  . sodium chloride  3 mL Intravenous Q12H  . venlafaxine XR  37.5 mg Oral Q breakfast  . Warfarin - Pharmacist Dosing Inpatient   Does not apply q1800   No results found. BMET    Component Value Date/Time   NA 135 10/24/2015 0428   NA 139 01/11/2015 1520   K 5.0 10/24/2015 0428   K 4.4 01/11/2015 1520   CL 109 10/24/2015 0428   CL 111* 12/04/2012 1056   CO2 18* 10/24/2015 0428   CO2 21* 01/11/2015 1520   GLUCOSE 103* 10/24/2015 0428   GLUCOSE 92 01/11/2015 1520   GLUCOSE 97 12/04/2012 1056   BUN 49* 10/24/2015 0428   BUN 24.5 01/11/2015 1520   CREATININE 3.08* 10/24/2015 0428   CREATININE 1.9* 01/11/2015 1520   CALCIUM 9.8 10/24/2015 0428   CALCIUM 9.2 01/11/2015 1520   GFRNONAA 15* 10/24/2015 0428   GFRAA 18* 10/24/2015 0428   CBC    Component Value Date/Time   WBC 13.1* 10/24/2015 0428   WBC 13.6* 08/14/2015 1137   RBC 4.35 10/24/2015 0428   RBC 4.23 08/14/2015 1137   HGB 11.5* 10/24/2015 0428   HGB 11.5* 08/14/2015 1137   HCT 36.5 10/24/2015 0428   HCT 36.6 08/14/2015 1137   PLT 346 10/24/2015 0428   PLT 224 08/14/2015 1137   MCV 83.9 10/24/2015 0428   MCV 86.5 08/14/2015 1137   MCH 26.4 10/24/2015 0428   MCH 27.2 08/14/2015 1137   MCHC 31.5 10/24/2015 0428   MCHC 31.4* 08/14/2015 1137   RDW 14.1 10/24/2015 0428   RDW 14.9* 08/14/2015 1137   LYMPHSABS 3.0 08/14/2015 1137   LYMPHSABS 3.6 12/07/2009 0545   MONOABS 0.9 08/14/2015 1137   MONOABS 0.6 12/07/2009 0545   EOSABS 0.2 08/14/2015 1137   EOSABS 0.1 12/07/2009 0545   BASOSABS 0.0 08/14/2015 1137   BASOSABS 0.0 12/07/2009 0545     Assessment:  1.  Acute on CKD 3, Scr trending down.  UO still not recorded.  SPEP pend 2. Mild hyponatremia 3. PE/DVT on anticoagulation 4. HTN  Plan: 1. Restrict K intake 2. Daily Scr 3. Start Na bicarb 4.  Would like to see Scr trending down again before DC    Danahi Reddish T

## 2015-10-24 NOTE — Progress Notes (Signed)
Pt continuing to refuse cpap at this time, pt states she will wear oxygen instead

## 2015-10-24 NOTE — Progress Notes (Signed)
ANTICOAGULATION CONSULT NOTE - Follow Up Consult  Pharmacy Consult: Coumadin  Indication:  New PE + left common femoral vein DVT   Patient Measurements: Height: 5\' 3"  (160 cm) Weight: 228 lb 8 oz (103.647 kg) IBW/kg (Calculated) : 52.4 Heparin Dosing Weight: 79 kg  Vital Signs: Temp: 97.3 F (36.3 C) (10/25 0506) Temp Source: Oral (10/25 0506) BP: 140/76 mmHg (10/25 0506) Pulse Rate: 70 (10/25 0506)  Labs:  Recent Labs  10/22/15 0006 10/23/15 0345 10/23/15 1949 10/24/15 0428  HGB 11.0* 10.9*  --  11.5*  HCT 33.9* 34.3*  --  36.5  PLT 289 336  --  346  LABPROT 21.6* 30.2*  --  29.6*  INR 1.89* 2.94*  --  2.87*  HEPARINUNFRC 0.48 0.94* 1.58* 0.73*  CREATININE  --  2.89*  --  3.08*    Estimated Creatinine Clearance: 22.4 mL/min (by C-G formula based on Cr of 3.08).     Assessment: 22 YOF with new PE and DVT that was bridged on heparin and Coumadin (received 6 days of overlap). INR 2.87 after holding dose on 10/24. Stopping heparin resume warfarin tonight.  Goal of Therapy:  INR 2 - 3 Monitor platelets by anticoagulation protocol: Yes    Plan:  - heparin stopped this morning after receiving appropriate overlap/bridge therapy  - warfarin 5 mg x 1 tonight  - Daily INR while inpatient - F/U resume home med ASA  Vincenza Hews, PharmD, BCPS 10/24/2015, 7:36 AM Pager: [phone]

## 2015-10-25 LAB — CBC
HCT: 36.9 % (ref 36.0–46.0)
Hemoglobin: 11.6 g/dL — ABNORMAL LOW (ref 12.0–15.0)
MCH: 26.7 pg (ref 26.0–34.0)
MCHC: 31.4 g/dL (ref 30.0–36.0)
MCV: 84.8 fL (ref 78.0–100.0)
Platelets: 320 10*3/uL (ref 150–400)
RBC: 4.35 MIL/uL (ref 3.87–5.11)
RDW: 14.2 % (ref 11.5–15.5)
WBC: 11.1 10*3/uL — ABNORMAL HIGH (ref 4.0–10.5)

## 2015-10-25 LAB — RENAL FUNCTION PANEL
Albumin: 2.5 g/dL — ABNORMAL LOW (ref 3.5–5.0)
Anion gap: 8 (ref 5–15)
BUN: 48 mg/dL — ABNORMAL HIGH (ref 6–20)
CO2: 14 mmol/L — ABNORMAL LOW (ref 22–32)
Calcium: 9.5 mg/dL (ref 8.9–10.3)
Chloride: 110 mmol/L (ref 101–111)
Creatinine, Ser: 2.84 mg/dL — ABNORMAL HIGH (ref 0.44–1.00)
GFR calc Af Amer: 20 mL/min — ABNORMAL LOW (ref >60)
GFR calc non Af Amer: 17 mL/min — ABNORMAL LOW (ref >60)
Glucose, Bld: 116 mg/dL — ABNORMAL HIGH (ref 65–99)
Phosphorus: 4.9 mg/dL — ABNORMAL HIGH (ref 2.5–4.6)
Potassium: 5 mmol/L (ref 3.5–5.1)
Sodium: 132 mmol/L — ABNORMAL LOW (ref 135–145)

## 2015-10-25 LAB — PROTIME-INR
INR: 2.85 — ABNORMAL HIGH (ref 0.00–1.49)
Prothrombin Time: 29.4 seconds — ABNORMAL HIGH (ref 11.6–15.2)

## 2015-10-25 MED ORDER — SODIUM BICARBONATE 650 MG PO TABS: 650.0000 mg | ORAL_TABLET | Freq: Two times a day (BID) | ORAL | Status: DC

## 2015-10-25 MED ORDER — OXYCODONE HCL 5 MG PO TABS: 5.0000 mg | ORAL_TABLET | Freq: Four times a day (QID) | ORAL | Status: DC | PRN

## 2015-10-25 MED ORDER — WARFARIN SODIUM 7.5 MG PO TABS: 7.5000 mg | ORAL_TABLET | Freq: Every day | ORAL | Status: DC

## 2015-10-25 MED ORDER — VENLAFAXINE HCL ER 37.5 MG PO CP24: 37.5000 mg | ORAL_CAPSULE | Freq: Every day | ORAL | Status: DC

## 2015-10-25 NOTE — Progress Notes (Signed)
S:  Ready to go home O:BP 129/55 mmHg  Pulse 62  Temp(Src) 97.9 F (36.6 C) (Oral)  Resp 18  Ht 5\' 3"  (1.6 m)  Wt 103.012 kg (227 lb 1.6 oz)  BMI 40.24 kg/m2  SpO2 96%  Intake/Output Summary (Last 24 hours) at 10/25/15 [id number] Last data filed at 10/25/15 0403  Gross per 24 hour  Intake    700 ml  Output    710 ml  Net    -10 ml   Weight change: -0.635 kg (-1 lb 6.4 oz) [id number] and alert CVS:RRR Resp:clear Abd:+ BS NTND No HSM Ext:  No edema NEURO:CNI Ox3 no asterixis   . amLODipine  10 mg Oral Daily  .  morphine injection  4 mg Intravenous Once  . nebivolol  10 mg Oral Daily  . sodium bicarbonate  650 mg Oral BID  . sodium chloride  3 mL Intravenous Q12H  . venlafaxine XR  37.5 mg Oral Q breakfast  . Warfarin - Pharmacist Dosing Inpatient   Does not apply q1800   No results found. BMET    Component Value Date/Time   NA 132* 10/25/2015 0400   NA 139 01/11/2015 1520   K 5.0 10/25/2015 0400   K 4.4 01/11/2015 1520   CL 110 10/25/2015 0400   CL 111* 12/04/2012 1056   CO2 14* 10/25/2015 0400   CO2 21* 01/11/2015 1520   GLUCOSE 116* 10/25/2015 0400   GLUCOSE 92 01/11/2015 1520   GLUCOSE 97 12/04/2012 1056   BUN 48* 10/25/2015 0400   BUN 24.5 01/11/2015 1520   CREATININE 2.84* 10/25/2015 0400   CREATININE 1.9* 01/11/2015 1520   CALCIUM 9.5 10/25/2015 0400   CALCIUM 9.2 01/11/2015 1520   GFRNONAA 17* 10/25/2015 0400   GFRAA 20* 10/25/2015 0400   CBC    Component Value Date/Time   WBC 11.1* 10/25/2015 0400   WBC 13.6* 08/14/2015 1137   RBC 4.35 10/25/2015 0400   RBC 4.23 08/14/2015 1137   HGB 11.6* 10/25/2015 0400   HGB 11.5* 08/14/2015 1137   HCT 36.9 10/25/2015 0400   HCT 36.6 08/14/2015 1137   PLT 320 10/25/2015 0400   PLT 224 08/14/2015 1137   MCV 84.8 10/25/2015 0400   MCV 86.5 08/14/2015 1137   MCH 26.7 10/25/2015 0400   MCH 27.2 08/14/2015 1137   MCHC 31.4 10/25/2015 0400   MCHC 31.4* 08/14/2015 1137   RDW 14.2 10/25/2015 0400   RDW 14.9*  08/14/2015 1137   LYMPHSABS 3.0 08/14/2015 1137   LYMPHSABS 3.6 12/07/2009 0545   MONOABS 0.9 08/14/2015 1137   MONOABS 0.6 12/07/2009 0545   EOSABS 0.2 08/14/2015 1137   EOSABS 0.1 12/07/2009 0545   BASOSABS 0.0 08/14/2015 1137   BASOSABS 0.0 12/07/2009 0545     Assessment:  1.  Acute on CKD 3, Scr trending down. SPEP shows small m-spike 2. Mild hyponatremia 3. PE/DVT on anticoagulation 4. HTN  Plan: 1. Add immunofixation and check free light chains 2. OK to go from renal standpoint.  Will plan to get FU labs in 2 weeks and will decide on FU based on above lab tests    Whitney Walker T

## 2015-10-25 NOTE — Discharge Summary (Signed)
Physician Discharge Summary  Whitney Walker [id number] DOB: Mar 19, 1956 DOA: 10/17/2015  PCP: Maximino Greenland, MD  Admit date: 10/17/2015 Discharge date: 10/25/2015  Time spent: > 35 minutes  Recommendations for Outpatient Follow-up:  1. Monitor serum creatinine 2. Monitor INR levels 3. Monitor blood pressures 4. Monitor WBC levels  Discharge Diagnoses:  Principal Problem:   MDD (major depressive disorder), recurrent severe, without psychosis ([place]) Active Problems:   Leucocytosis   Pulmonary embolism (HCC)   CKD (chronic kidney disease) stage 4, GFR 15-29 ml/min (HCC)   Chronic diastolic heart failure (HCC)   HTN (hypertension)   DVT (deep venous thrombosis) ([place])   Depression   Discharge Condition: stable  Diet recommendation: heart healthy  Filed Weights   10/23/15 0424 10/24/15 0506 10/25/15 0402  Weight: 103.828 kg (228 lb 14.4 oz) 103.647 kg (228 lb 8 oz) 103.012 kg (227 lb 1.6 oz)    History of present illness:  From prior PN [name] is a 58 y/o female with PMH of CKD Stage 4, chronic diastolic heart failure, HTN, cirrhosis, CAD, PAD, depression and prior PE admitted 10/17 after presenting to the ED with chest pain and SOB that had been ongoing x 3 days. VQ scan at that time showed high probability of PE in the left lung. She has prior history of pulmonary embolism in 2014 following a surgery and was placed on Xarelto on that time. She has not been on chronic anticoagulation medications for several months now. Lower extremity duplex performed 10/19 with DVT in Left CFV.   Hospital Course:  Acute on Chronic Kidney Disease Stage 4  - Nephrology to follow as outpatient. - Patient has been instructed by nephrologist in house to follow-up with nephrologist as outpatient for further monitoring  Pulmonary Embolism and LLE DVT - Patient presented to ED 10/18 with complaints of SOB and chest pain. V/Q scan at that time revealed high probability for pulmonary  embolism in the left lung. - Bilateral LE duplex performed 10/20 positive for DVT in L CFV - patient with prior PE in 2014 for which she was anticoagulated with Xarelto for a year, now off of anticoagulation, this is her second episode, she will need life long anticoagulation  - Secondary to CKD we will use warfarin on discharge - Will provide oxy IR on d/c for any discomfort patient may have related to this  Depression  - Patient has history of depression, not currently on any antidepressant medications at home  - psychiatry consulted 10/20 - started Effexor XR 37.5 mg   HTN  - BP stable, continue current regimen  Chronic Diastolic Heart Failure - Last echo performed 10/2013 - EF 65-70% with Grade I diastolic dysfunction  - appears euvolemic at this time   COPD - stable, no wheezing on day of discharge  Chronic leukocytosis  - followed up as an outpatient - trending down without antibiotics - No source of infection identified or reported on day of discharge  Procedures:  None  Consultations:  Psychiatry  Discharge Exam: Filed Vitals:   10/25/15 0840  BP: 130/70  Pulse:   Temp:   Resp:     General: Pt in nad, alert and awake Cardiovascular: rrr, no mrg Respiratory: cta bl, no wheezes, equal chest rise, no increased work of breathing on room air  Discharge Instructions   Discharge Instructions    Call MD for:  difficulty breathing, headache or visual disturbances    Complete by:  As directed  Call MD for:  temperature >100.4    Complete by:  As directed      Diet - low sodium heart healthy    Complete by:  As directed      Discharge instructions    Complete by:  As directed   Please be sure to follow up with your primary care physician in 1 week after discharge so that they can continue to monitor your INR levels. Home health agency is to send your INR levels to your primary care physician's office for further evaluation.     Increase activity slowly     Complete by:  As directed           Current Discharge Medication List    START taking these medications   Details  oxyCODONE (OXY IR/ROXICODONE) 5 MG immediate release tablet Take 1 tablet (5 mg total) by mouth every 6 (six) hours as needed for moderate pain. Qty: 30 tablet, Refills: 0    sodium bicarbonate 650 MG tablet Take 1 tablet (650 mg total) by mouth 2 (two) times daily. Qty: 60 tablet, Refills: 0    venlafaxine XR (EFFEXOR-XR) 37.5 MG 24 hr capsule Take 1 capsule (37.5 mg total) by mouth daily with breakfast. Qty: 30 capsule, Refills: 0    warfarin (COUMADIN) 7.5 MG tablet Take 1 tablet (7.5 mg total) by mouth daily. Qty: 14 tablet, Refills: 0      CONTINUE these medications which have NOT CHANGED   Details  amLODipine (NORVASC) 10 MG tablet Take 10 mg by mouth daily.    BYSTOLIC 10 MG tablet Take 10 mg by mouth daily.   Associated Diagnoses: Leucocytosis    PROAIR RESPICLICK 123XX123 (90 BASE) MCG/ACT AEPB       STOP taking these medications     aspirin 81 MG tablet        Allergies  Allergen Reactions  . Iohexol Other (See Comments)     Code: HIVES, Desc: HIVES W/ "DYE" USED FOR 1ST CT SCAN BUT NOT 2ND, NO PREMEDS USED, PT UNCERTAIN OF CIRCUMSTANCES,,?POSSIBLE MRI CONTRAST ALLERGY, ALL STUDIES DONE "SOMEWHERE" IN [place]//A.C., Onset Date: [id number]    Follow-up Information    Follow up with [facility].   Specialty:  [place]   Why:  Registered Nurse, Physical Therapy   Contact information:   [address] [place] [address] [phone]        The results of significant diagnostics from this hospitalization (including imaging, microbiology, ancillary and laboratory) are listed below for reference.    Significant Diagnostic Studies: Dg Chest 2 View  10/17/2015  CLINICAL DATA:  Chest pain for 3 days EXAM: CHEST  2 VIEW COMPARISON:  09/11/2014 FINDINGS: Borderline cardiomegaly. No acute infiltrate or pleural  effusion. No pulmonary edema. Degenerative changes mid and lower thoracic spine. IMPRESSION: No active cardiopulmonary disease. Electronically Signed   By: Lahoma Crocker M.D.   On: 10/17/2015 16:46   US Renal  10/19/2015  CLINICAL DATA:  History of renal failure, also history of carcinoma of the colon with partial colectomy EXAM: RENAL / URINARY TRACT ULTRASOUND COMPLETE COMPARISON:  CT abdomen pelvis of 05/18/2012 FINDINGS: Right Kidney: Length: 11.2 cm. No hydronephrosis is seen. A cyst is present in the lower pole laterally of 4.3 x 3.7 x 4.6 cm. Left Kidney: Length: 10.2 cm. No hydronephrosis is noted. A 2.0 cm cyst is noted in the periphery of the left mid kidney laterally. Bladder: The urinary bladder is unremarkable. This study is  somewhat compromised by large patient body habitus. IMPRESSION: 1. No hydronephrosis. 2. Single bilateral renal cysts. Electronically Signed   By: Ivar Drape M.D.   On: 10/19/2015 15:08   Nm Pulmonary Perf And Vent  10/17/2015  CLINICAL DATA:  Shortness of Breath EXAM: NUCLEAR MEDICINE VENTILATION - PERFUSION LUNG SCAN TECHNIQUE: Ventilation images were obtained in multiple projections using inhaled aerosol Tc-78m DTPA. Perfusion images were obtained in multiple projections after intravenous injection of Tc-71m MAA. Lateral images of the lungs could not be obtained due to patient's inability to tolerate procedure. RADIOPHARMACEUTICALS:  30 mCi Technetium-20m DTPA aerosol inhalation and 3 mCi Technetium-64m MAA IV COMPARISON:  11/14/2013 FINDINGS: Ventilation: Somewhat limited due to lack of lateral imaging. Ventilation images show no significant defect. Perfusion: Perfusion images are also limited due the lack of lateral imaging. There is a wedge-shaped defect identified which projects in the left upper lobe posteriorly. IMPRESSION: Somewhat limited exam due to the lack of lateral imaging. High probability scan for pulmonary embolus in the left lung as described.  Electronically Signed   By: Inez Catalina M.D.   On: 10/17/2015 18:26   Dg Abd Portable 1v  10/18/2015  CLINICAL DATA:  Vomiting tonight. EXAM: PORTABLE ABDOMEN - 1 VIEW COMPARISON:  None. FINDINGS: Relative paucity of bowel gas with air scattered throughout colon in the central and lower abdomen. No air-filled dilated bowel loops to suggest obstruction. Surgical clips in the right upper quadrant of the abdomen. No abnormal soft tissue calcifications. Vascular calcifications are seen. IMPRESSION: Paucity of bowel gas, no air-filled dilated bowel loops are seen. Electronically Signed   By: Jeb Levering M.D.   On: 10/18/2015 00:47   Mm Digital Screening Bilateral  09/27/2015  CLINICAL DATA:  Screening. EXAM: DIGITAL SCREENING BILATERAL MAMMOGRAM WITH CAD COMPARISON:  Previous exam(s). ACR Breast Density Category a: The breast tissue is almost entirely fatty. FINDINGS: In the left breast, a possible asymmetry warrants further evaluation. In the right breast, no findings suspicious for malignancy. Images were processed with CAD. IMPRESSION: Further evaluation is suggested for possible asymmetry in the left breast. RECOMMENDATION: Diagnostic mammogram and possibly ultrasound of the left breast. (Code:[id number]) The patient will be contacted regarding the findings, and additional imaging will be scheduled. BI-RADS CATEGORY  0: Incomplete. Need additional imaging evaluation and/or prior mammograms for comparison. Electronically Signed   By: Altamese Cabal M.D.   On: 09/27/2015 14:55    Microbiology: No results found for this or any previous visit (from the past 240 hour(s)).   Labs: Basic Metabolic Panel:  Recent Labs Lab 10/20/15 0330 10/21/15 0111 10/23/15 0345 10/24/15 0428 10/25/15 0400  NA 132* 133* 138 135 132*  K 4.4 4.1 4.3 5.0 5.0  CL 102 106 114* 109 110  CO2 20* 17* 17* 18* 14*  GLUCOSE 105* 119* 114* 103* 116*  BUN 53* 54* 48* 49* 48*  CREATININE 4.09* 3.40* 2.89* 3.08* 2.84*   CALCIUM 9.2 9.5 9.8 9.8 9.5  PHOS 5.8*  --  4.7* 5.7* 4.9*   Liver Function Tests:  Recent Labs Lab 10/20/15 0330 10/23/15 0345 10/24/15 0428 10/25/15 0400  ALBUMIN 2.2* 2.2* 2.4* 2.5*   No results for input(s): LIPASE, AMYLASE in the last 168 hours. No results for input(s): AMMONIA in the last 168 hours. CBC:  Recent Labs Lab 10/21/15 0111 10/22/15 0006 10/23/15 0345 10/24/15 0428 10/25/15 0400  WBC 17.3* 17.1* 14.6* 13.1* 11.1*  HGB 10.6* 11.0* 10.9* 11.5* 11.6*  HCT 32.8* 33.9* 34.3* 36.5 36.9  MCV  83.0 82.3 83.7 83.9 84.8  PLT 256 289 336 346 320   Cardiac Enzymes: No results for input(s): CKTOTAL, CKMB, CKMBINDEX, TROPONINI in the last 168 hours. BNP: BNP (last 3 results)  Recent Labs  10/17/15 1600  BNP 115.8*    ProBNP (last 3 results) No results for input(s): PROBNP in the last 8760 hours.  CBG: No results for input(s): GLUCAP in the last 168 hours.     Signed:  Velvet Bathe  [facility] 10/25/2015, 10:03 AM

## 2015-10-26 LAB — KAPPA/LAMBDA LIGHT CHAINS
Kappa free light chain: 193.54 mg/L — ABNORMAL HIGH (ref 3.30–19.40)
Kappa, lambda light chain ratio: 1.76 — ABNORMAL HIGH (ref 0.26–1.65)
Lambda free light chains: 109.79 mg/L — ABNORMAL HIGH (ref 5.71–26.30)

## 2015-10-26 LAB — IMMUNOFIXATION ELECTROPHORESIS
IgA: 699 mg/dL — ABNORMAL HIGH (ref 87–352)
IgG (Immunoglobin G), Serum: 1834 mg/dL — ABNORMAL HIGH (ref 700–1600)
IgM, Serum: 47 mg/dL (ref 26–217)
Total Protein ELP: 7.1 g/dL (ref 6.0–8.5)

## 2015-10-27 DIAGNOSIS — I2510 Atherosclerotic heart disease of native coronary artery without angina pectoris: Secondary | ICD-10-CM | POA: Diagnosis not present

## 2015-10-27 DIAGNOSIS — I2699 Other pulmonary embolism without acute cor pulmonale: Secondary | ICD-10-CM | POA: Diagnosis not present

## 2015-10-27 DIAGNOSIS — I739 Peripheral vascular disease, unspecified: Secondary | ICD-10-CM | POA: Diagnosis not present

## 2015-10-27 DIAGNOSIS — I5032 Chronic diastolic (congestive) heart failure: Secondary | ICD-10-CM | POA: Diagnosis not present

## 2015-10-27 DIAGNOSIS — Z87891 Personal history of nicotine dependence: Secondary | ICD-10-CM | POA: Diagnosis not present

## 2015-10-27 DIAGNOSIS — Z7901 Long term (current) use of anticoagulants: Secondary | ICD-10-CM | POA: Diagnosis not present

## 2015-10-27 DIAGNOSIS — F329 Major depressive disorder, single episode, unspecified: Secondary | ICD-10-CM | POA: Diagnosis not present

## 2015-10-27 DIAGNOSIS — J449 Chronic obstructive pulmonary disease, unspecified: Secondary | ICD-10-CM | POA: Diagnosis not present

## 2015-10-27 DIAGNOSIS — N184 Chronic kidney disease, stage 4 (severe): Secondary | ICD-10-CM | POA: Diagnosis not present

## 2015-10-27 DIAGNOSIS — D649 Anemia, unspecified: Secondary | ICD-10-CM | POA: Diagnosis not present

## 2015-10-27 DIAGNOSIS — G4730 Sleep apnea, unspecified: Secondary | ICD-10-CM | POA: Diagnosis not present

## 2015-10-27 DIAGNOSIS — I130 Hypertensive heart and chronic kidney disease with heart failure and stage 1 through stage 4 chronic kidney disease, or unspecified chronic kidney disease: Secondary | ICD-10-CM | POA: Diagnosis not present

## 2015-10-27 DIAGNOSIS — I82412 Acute embolism and thrombosis of left femoral vein: Secondary | ICD-10-CM | POA: Diagnosis not present

## 2015-10-27 DIAGNOSIS — Z85038 Personal history of other malignant neoplasm of large intestine: Secondary | ICD-10-CM | POA: Diagnosis not present

## 2015-10-27 DIAGNOSIS — F419 Anxiety disorder, unspecified: Secondary | ICD-10-CM | POA: Diagnosis not present

## 2015-10-29 DIAGNOSIS — I130 Hypertensive heart and chronic kidney disease with heart failure and stage 1 through stage 4 chronic kidney disease, or unspecified chronic kidney disease: Secondary | ICD-10-CM | POA: Diagnosis not present

## 2015-10-29 DIAGNOSIS — I2699 Other pulmonary embolism without acute cor pulmonale: Secondary | ICD-10-CM | POA: Diagnosis not present

## 2015-10-29 DIAGNOSIS — N184 Chronic kidney disease, stage 4 (severe): Secondary | ICD-10-CM | POA: Diagnosis not present

## 2015-10-29 DIAGNOSIS — J449 Chronic obstructive pulmonary disease, unspecified: Secondary | ICD-10-CM | POA: Diagnosis not present

## 2015-10-29 DIAGNOSIS — I5032 Chronic diastolic (congestive) heart failure: Secondary | ICD-10-CM | POA: Diagnosis not present

## 2015-10-29 DIAGNOSIS — I82412 Acute embolism and thrombosis of left femoral vein: Secondary | ICD-10-CM | POA: Diagnosis not present

## 2015-10-30 ENCOUNTER — Other Ambulatory Visit: Payer: Self-pay | Admitting: Hematology

## 2015-10-30 DIAGNOSIS — R928 Other abnormal and inconclusive findings on diagnostic imaging of breast: Secondary | ICD-10-CM

## 2015-11-01 DIAGNOSIS — I2699 Other pulmonary embolism without acute cor pulmonale: Secondary | ICD-10-CM | POA: Diagnosis not present

## 2015-11-01 DIAGNOSIS — I5032 Chronic diastolic (congestive) heart failure: Secondary | ICD-10-CM | POA: Diagnosis not present

## 2015-11-01 DIAGNOSIS — I130 Hypertensive heart and chronic kidney disease with heart failure and stage 1 through stage 4 chronic kidney disease, or unspecified chronic kidney disease: Secondary | ICD-10-CM | POA: Diagnosis not present

## 2015-11-01 DIAGNOSIS — I82412 Acute embolism and thrombosis of left femoral vein: Secondary | ICD-10-CM | POA: Diagnosis not present

## 2015-11-01 DIAGNOSIS — J449 Chronic obstructive pulmonary disease, unspecified: Secondary | ICD-10-CM | POA: Diagnosis not present

## 2015-11-01 DIAGNOSIS — N184 Chronic kidney disease, stage 4 (severe): Secondary | ICD-10-CM | POA: Diagnosis not present

## 2015-11-06 DIAGNOSIS — I130 Hypertensive heart and chronic kidney disease with heart failure and stage 1 through stage 4 chronic kidney disease, or unspecified chronic kidney disease: Secondary | ICD-10-CM | POA: Diagnosis not present

## 2015-11-06 DIAGNOSIS — N184 Chronic kidney disease, stage 4 (severe): Secondary | ICD-10-CM | POA: Diagnosis not present

## 2015-11-06 DIAGNOSIS — J449 Chronic obstructive pulmonary disease, unspecified: Secondary | ICD-10-CM | POA: Diagnosis not present

## 2015-11-06 DIAGNOSIS — I82412 Acute embolism and thrombosis of left femoral vein: Secondary | ICD-10-CM | POA: Diagnosis not present

## 2015-11-06 DIAGNOSIS — I5032 Chronic diastolic (congestive) heart failure: Secondary | ICD-10-CM | POA: Diagnosis not present

## 2015-11-06 DIAGNOSIS — I2699 Other pulmonary embolism without acute cor pulmonale: Secondary | ICD-10-CM | POA: Diagnosis not present

## 2015-11-07 DIAGNOSIS — J449 Chronic obstructive pulmonary disease, unspecified: Secondary | ICD-10-CM | POA: Diagnosis not present

## 2015-11-07 DIAGNOSIS — N184 Chronic kidney disease, stage 4 (severe): Secondary | ICD-10-CM | POA: Diagnosis not present

## 2015-11-07 DIAGNOSIS — I2699 Other pulmonary embolism without acute cor pulmonale: Secondary | ICD-10-CM | POA: Diagnosis not present

## 2015-11-07 DIAGNOSIS — I5032 Chronic diastolic (congestive) heart failure: Secondary | ICD-10-CM | POA: Diagnosis not present

## 2015-11-07 DIAGNOSIS — I130 Hypertensive heart and chronic kidney disease with heart failure and stage 1 through stage 4 chronic kidney disease, or unspecified chronic kidney disease: Secondary | ICD-10-CM | POA: Diagnosis not present

## 2015-11-07 DIAGNOSIS — I82412 Acute embolism and thrombosis of left femoral vein: Secondary | ICD-10-CM | POA: Diagnosis not present

## 2015-11-08 ENCOUNTER — Ambulatory Visit
Admission: RE | Admit: 2015-11-08 | Discharge: 2015-11-08 | Disposition: A | Discharge status: Home / Self Care | Payer: Medicare Other | Source: Ambulatory Visit | Attending: Hematology | Admitting: Hematology

## 2015-11-08 ENCOUNTER — Other Ambulatory Visit: Payer: Self-pay | Admitting: Hematology

## 2015-11-08 DIAGNOSIS — R928 Other abnormal and inconclusive findings on diagnostic imaging of breast: Secondary | ICD-10-CM

## 2015-11-08 DIAGNOSIS — N63 Unspecified lump in breast: Secondary | ICD-10-CM | POA: Diagnosis not present

## 2015-11-08 DIAGNOSIS — N6320 Unspecified lump in the left breast, unspecified quadrant: Secondary | ICD-10-CM

## 2015-11-09 DIAGNOSIS — I130 Hypertensive heart and chronic kidney disease with heart failure and stage 1 through stage 4 chronic kidney disease, or unspecified chronic kidney disease: Secondary | ICD-10-CM | POA: Diagnosis not present

## 2015-11-09 DIAGNOSIS — N184 Chronic kidney disease, stage 4 (severe): Secondary | ICD-10-CM | POA: Diagnosis not present

## 2015-11-09 DIAGNOSIS — I2699 Other pulmonary embolism without acute cor pulmonale: Secondary | ICD-10-CM | POA: Diagnosis not present

## 2015-11-09 DIAGNOSIS — I82412 Acute embolism and thrombosis of left femoral vein: Secondary | ICD-10-CM | POA: Diagnosis not present

## 2015-11-09 DIAGNOSIS — J449 Chronic obstructive pulmonary disease, unspecified: Secondary | ICD-10-CM | POA: Diagnosis not present

## 2015-11-09 DIAGNOSIS — I5032 Chronic diastolic (congestive) heart failure: Secondary | ICD-10-CM | POA: Diagnosis not present

## 2015-11-10 DIAGNOSIS — N184 Chronic kidney disease, stage 4 (severe): Secondary | ICD-10-CM | POA: Diagnosis not present

## 2015-11-10 DIAGNOSIS — I82412 Acute embolism and thrombosis of left femoral vein: Secondary | ICD-10-CM | POA: Diagnosis not present

## 2015-11-10 DIAGNOSIS — I2699 Other pulmonary embolism without acute cor pulmonale: Secondary | ICD-10-CM | POA: Diagnosis not present

## 2015-11-10 DIAGNOSIS — I130 Hypertensive heart and chronic kidney disease with heart failure and stage 1 through stage 4 chronic kidney disease, or unspecified chronic kidney disease: Secondary | ICD-10-CM | POA: Diagnosis not present

## 2015-11-10 DIAGNOSIS — J449 Chronic obstructive pulmonary disease, unspecified: Secondary | ICD-10-CM | POA: Diagnosis not present

## 2015-11-10 DIAGNOSIS — I5032 Chronic diastolic (congestive) heart failure: Secondary | ICD-10-CM | POA: Diagnosis not present

## 2015-11-13 NOTE — Progress Notes (Signed)
Called pt & she states that someone from the breast center on [address] called her & said they were just going to monitor for now b/c the MD said she couldn't come off coumadin.  Someone by the name of Myrtie Cruise called her with that info.  Call back # [phone].  Message left for her to call back with update.

## 2015-11-14 ENCOUNTER — Other Ambulatory Visit: Payer: Self-pay | Admitting: *Deleted

## 2015-11-14 ENCOUNTER — Telehealth: Payer: Self-pay | Admitting: *Deleted

## 2015-11-14 NOTE — Telephone Encounter (Signed)
Called & spoke with Lynn/RN/Navigator at [facility] to find out the issues with getting a biopsy of breast.  She states that pt is on blood thinners & would need bridge to do biopsy.  Dr Fidela Salisbury is concerned that this is breast cancer & is OK to wait a little while but not too long.  She reports that Dr Glendale Chard is pt's PCP.  Called Dr Baird Cancer & she has an appt there tomorrow but she would like Dr Burr Medico to do bridging.  Called pt to discuss coming in to see Dr Burr Medico to discuss & she was very upset & states that she has already been told that she is OK to wait for biopsy & doesn't want any shots in her abdomen.  She sees no reason to come in to see Dr Burr Medico.  Pt was allowed to speak with Dr Burr Medico & agreed to wait 3-4 weeks to come discuss this issue with Dr Burr Medico but will need to be sooner if Dr Lynder Parents office is not willing to monitor coumadin.  POF to schedulers to change appt with Dr Burr Medico to 3-4 wks.  Will wait to hear from Dr Baird Cancer.

## 2015-11-15 ENCOUNTER — Telehealth: Payer: Self-pay | Admitting: Hematology

## 2015-11-15 DIAGNOSIS — I82402 Acute embolism and thrombosis of unspecified deep veins of left lower extremity: Secondary | ICD-10-CM | POA: Diagnosis not present

## 2015-11-15 DIAGNOSIS — Z7901 Long term (current) use of anticoagulants: Secondary | ICD-10-CM | POA: Diagnosis not present

## 2015-11-15 DIAGNOSIS — I2699 Other pulmonary embolism without acute cor pulmonale: Secondary | ICD-10-CM | POA: Diagnosis not present

## 2015-11-15 DIAGNOSIS — F329 Major depressive disorder, single episode, unspecified: Secondary | ICD-10-CM | POA: Diagnosis not present

## 2015-11-15 NOTE — Telephone Encounter (Signed)
sw pt and advised on DEC appt...pt ok and aware °

## 2015-11-17 ENCOUNTER — Emergency Department (HOSPITAL_COMMUNITY)
Admission: EM | Admit: 2015-11-17 | Discharge: 2015-11-17 | Disposition: A | Discharge status: Home / Self Care | Payer: Medicare Other | Attending: Emergency Medicine | Admitting: Emergency Medicine

## 2015-11-17 ENCOUNTER — Encounter (HOSPITAL_COMMUNITY): Payer: Self-pay | Admitting: *Deleted

## 2015-11-17 DIAGNOSIS — Z79899 Other long term (current) drug therapy: Secondary | ICD-10-CM | POA: Diagnosis not present

## 2015-11-17 DIAGNOSIS — I130 Hypertensive heart and chronic kidney disease with heart failure and stage 1 through stage 4 chronic kidney disease, or unspecified chronic kidney disease: Secondary | ICD-10-CM | POA: Diagnosis not present

## 2015-11-17 DIAGNOSIS — Z86711 Personal history of pulmonary embolism: Secondary | ICD-10-CM | POA: Diagnosis not present

## 2015-11-17 DIAGNOSIS — I82412 Acute embolism and thrombosis of left femoral vein: Secondary | ICD-10-CM | POA: Diagnosis not present

## 2015-11-17 DIAGNOSIS — Z862 Personal history of diseases of the blood and blood-forming organs and certain disorders involving the immune mechanism: Secondary | ICD-10-CM | POA: Diagnosis not present

## 2015-11-17 DIAGNOSIS — Z87891 Personal history of nicotine dependence: Secondary | ICD-10-CM | POA: Diagnosis not present

## 2015-11-17 DIAGNOSIS — I2699 Other pulmonary embolism without acute cor pulmonale: Secondary | ICD-10-CM | POA: Diagnosis not present

## 2015-11-17 DIAGNOSIS — Z8669 Personal history of other diseases of the nervous system and sense organs: Secondary | ICD-10-CM | POA: Diagnosis not present

## 2015-11-17 DIAGNOSIS — F329 Major depressive disorder, single episode, unspecified: Secondary | ICD-10-CM | POA: Diagnosis not present

## 2015-11-17 DIAGNOSIS — I159 Secondary hypertension, unspecified: Secondary | ICD-10-CM | POA: Diagnosis not present

## 2015-11-17 DIAGNOSIS — I5032 Chronic diastolic (congestive) heart failure: Secondary | ICD-10-CM | POA: Diagnosis not present

## 2015-11-17 DIAGNOSIS — Z86718 Personal history of other venous thrombosis and embolism: Secondary | ICD-10-CM | POA: Insufficient documentation

## 2015-11-17 DIAGNOSIS — N183 Chronic kidney disease, stage 3 (moderate): Secondary | ICD-10-CM | POA: Insufficient documentation

## 2015-11-17 DIAGNOSIS — N184 Chronic kidney disease, stage 4 (severe): Secondary | ICD-10-CM | POA: Diagnosis not present

## 2015-11-17 DIAGNOSIS — F419 Anxiety disorder, unspecified: Secondary | ICD-10-CM | POA: Diagnosis not present

## 2015-11-17 DIAGNOSIS — E663 Overweight: Secondary | ICD-10-CM | POA: Insufficient documentation

## 2015-11-17 DIAGNOSIS — I509 Heart failure, unspecified: Secondary | ICD-10-CM | POA: Diagnosis not present

## 2015-11-17 DIAGNOSIS — I2510 Atherosclerotic heart disease of native coronary artery without angina pectoris: Secondary | ICD-10-CM | POA: Insufficient documentation

## 2015-11-17 DIAGNOSIS — Z85038 Personal history of other malignant neoplasm of large intestine: Secondary | ICD-10-CM | POA: Insufficient documentation

## 2015-11-17 DIAGNOSIS — H538 Other visual disturbances: Secondary | ICD-10-CM | POA: Insufficient documentation

## 2015-11-17 DIAGNOSIS — R51 Headache: Secondary | ICD-10-CM | POA: Diagnosis not present

## 2015-11-17 DIAGNOSIS — Z7901 Long term (current) use of anticoagulants: Secondary | ICD-10-CM | POA: Insufficient documentation

## 2015-11-17 DIAGNOSIS — M79602 Pain in left arm: Secondary | ICD-10-CM | POA: Insufficient documentation

## 2015-11-17 DIAGNOSIS — J449 Chronic obstructive pulmonary disease, unspecified: Secondary | ICD-10-CM | POA: Insufficient documentation

## 2015-11-17 DIAGNOSIS — Z8661 Personal history of infections of the central nervous system: Secondary | ICD-10-CM | POA: Insufficient documentation

## 2015-11-17 DIAGNOSIS — I129 Hypertensive chronic kidney disease with stage 1 through stage 4 chronic kidney disease, or unspecified chronic kidney disease: Secondary | ICD-10-CM | POA: Diagnosis present

## 2015-11-17 LAB — BASIC METABOLIC PANEL
Anion gap: 5 (ref 5–15)
BUN: 30 mg/dL — ABNORMAL HIGH (ref 6–20)
CO2: 20 mmol/L — ABNORMAL LOW (ref 22–32)
Calcium: 9.3 mg/dL (ref 8.9–10.3)
Chloride: 113 mmol/L — ABNORMAL HIGH (ref 101–111)
Creatinine, Ser: 2.23 mg/dL — ABNORMAL HIGH (ref 0.44–1.00)
GFR calc Af Amer: 26 mL/min — ABNORMAL LOW (ref >60)
GFR calc non Af Amer: 23 mL/min — ABNORMAL LOW (ref >60)
Glucose, Bld: 106 mg/dL — ABNORMAL HIGH (ref 65–99)
Potassium: 4.7 mmol/L (ref 3.5–5.1)
Sodium: 138 mmol/L (ref 135–145)

## 2015-11-17 LAB — CBC
HCT: 36.9 % (ref 36.0–46.0)
Hemoglobin: 11.3 g/dL — ABNORMAL LOW (ref 12.0–15.0)
MCH: 26.3 pg (ref 26.0–34.0)
MCHC: 30.6 g/dL (ref 30.0–36.0)
MCV: 85.8 fL (ref 78.0–100.0)
Platelets: 199 10*3/uL (ref 150–400)
RBC: 4.3 MIL/uL (ref 3.87–5.11)
RDW: 15.1 % (ref 11.5–15.5)
WBC: 11.8 10*3/uL — ABNORMAL HIGH (ref 4.0–10.5)

## 2015-11-17 LAB — TROPONIN I: Troponin I: <0.03 ng/mL (ref <0.031)

## 2015-11-17 NOTE — ED Provider Notes (Signed)
CSN: [id number]     Arrival date & time 11/17/15  1648 History   First MD Initiated Contact with Patient 11/17/15 1836     Chief Complaint  Patient presents with  . Arm Pain  . Hypertension     (Consider location/radiation/quality/duration/timing/severity/associated sxs/prior Treatment) HPI   Whitney Walker is a 59 y.o. female who presents for evaluation of high blood pressure, headache, left arm pain, and blurred vision. Her blood pressure was checked today by home health services, who found it to be in the range of 150/100. Because she had the associated symptoms mentioned above, they decided to send her for evaluation. She states her blurred vision has been present for many months. She also has left arm pain, which is ongoing for several week s. Her headache comes and goes ever since she was put on Coumadin, for her PE and DVT. She saw her doctor yesterday in follow-up of hospitalization 1 month ago, for the venous thromboembolism. She had blood work drawn yesterday, including Coumadin check. She denies chest pain, shortness of breath, weakness or dizziness. There are no other no modifying factors.   Past Medical History  Diagnosis Date  . Anxiety   . Arthritis   . COPD (chronic obstructive pulmonary disease) ([place])   . Depression   . Hypertension   . Meningitis   . Vertigo   . Colon cancer ([place])   . Peripheral vascular disease ([place])   . CKD (chronic kidney disease)     Stage III 03/2013  . Shortness of breath   . CHF (congestive heart failure) ([place])   . PE (pulmonary embolism)   . Coronary artery disease   . Sleep apnea   . Anemia    Past Surgical History  Procedure Laterality Date  . Cesarean section      X 3 [id number]  . Cholecystectomy    . Abdominal hysterectomy    . Diverticulosis surgery-2002  2012  . Colectomy  2010  . Cholecystectomy  1980/s  . Femoral-popliteal bypass graft Left 08/13/2013    Procedure: BYPASS GRAFT FEMORAL-POPLITEAL ARTERY WITH NON-REVERSED  SAPHANEOUS VEIN; ULTRASOUND GUIDED;  Surgeon: Mal [name], MD;  Location: [facility];  Service: Vascular;  Laterality: Left;  . Abdominal aortagram N/A 07/26/2013    Procedure: ABDOMINAL Maxcine Ham;  Surgeon: [name] New Brockton, MD;  Location: [facility] CATH LAB;  Service: Cardiovascular;  Laterality: N/A;  . Lower extremity angiogram Left 07/26/2013    Procedure: LOWER EXTREMITY ANGIOGRAM;  Surgeon: [name] Hookerton, MD;  Location: [facility] CATH LAB;  Service: Cardiovascular;  Laterality: Left;   Family History  Problem Relation Age of Onset  . Cancer Mother 10    breast and bone  . Cancer Father 28    prostate  . Cancer Cousin 20    breast cancer   . Hypertension Sister   . Bleeding Disorder Sister   . Hypertension Daughter    Social History  Substance Use Topics  . Smoking status: Former Smoker -- 1.50 packs/day for 40 years    Types: Cigarettes    Quit date: 08/09/2014  . Smokeless tobacco: Never Used  . Alcohol Use: No     Comment: she used to drink alcohol, quit in 2010    OB History    No data available     Review of Systems  All other systems reviewed and are negative.     Allergies  Iohexol  Home Medications   Prior to Admission medications   Medication Sig Start  Date End Date Taking? Authorizing Provider  albuterol (PROVENTIL HFA;VENTOLIN HFA) 108 (90 BASE) MCG/ACT inhaler Inhale 2 puffs into the lungs every 6 (six) hours as needed for wheezing or shortness of breath.   Yes Historical Provider, MD  amLODipine (NORVASC) 10 MG tablet Take 10 mg by mouth daily.   Yes Historical Provider, MD  BYSTOLIC 10 MG tablet Take 10 mg by mouth daily. 08/07/15  Yes Historical Provider, MD  venlafaxine XR (EFFEXOR-XR) 37.5 MG 24 hr capsule Take 1 capsule (37.5 mg total) by mouth daily with breakfast. 10/25/15  Yes Velvet Bathe, MD  warfarin (COUMADIN) 7.5 MG tablet Take 1 tablet (7.5 mg total) by mouth daily. 10/25/15  Yes Velvet Bathe, MD  oxyCODONE (OXY IR/ROXICODONE) 5 MG immediate release tablet  Take 1 tablet (5 mg total) by mouth every 6 (six) hours as needed for moderate pain. Patient not taking: Reported on 11/17/2015 10/25/15   Velvet Bathe, MD  sodium bicarbonate 650 MG tablet Take 1 tablet (650 mg total) by mouth 2 (two) times daily. Patient not taking: Reported on 11/17/2015 10/25/15   Velvet Bathe, MD   BP 134/77 mmHg  Pulse 59  Temp(Src) 98.4 F (36.9 C) (Oral)  Resp 18  Ht 5\' 3"  (1.6 m)  Wt 229 lb (103.874 kg)  BMI 40.58 kg/m2  SpO2 97% Physical Exam  Constitutional: She is oriented to person, place, and time. She appears well-developed. No distress.  She is overweight.  HENT:  Head: Normocephalic and atraumatic.  Right Ear: External ear normal.  Left Ear: External ear normal.  Eyes: Conjunctivae and EOM are normal. Pupils are equal, round, and reactive to light.  Neck: Normal range of motion and phonation normal. Neck supple.  Cardiovascular: Normal rate, regular rhythm and normal heart sounds.   Pulmonary/Chest: Effort normal and breath sounds normal. No respiratory distress. She exhibits no tenderness and no bony tenderness.  Abdominal: Soft. There is no tenderness.  Musculoskeletal: Normal range of motion.  Normal range of motion, arms and legs bilaterally.  Neurological: She is alert and oriented to person, place, and time. No cranial nerve deficit or sensory deficit. She exhibits normal muscle tone. Coordination normal.  Skin: Skin is warm, dry and intact.  Psychiatric: She has a normal mood and affect. Her behavior is normal. Judgment and thought content normal.  Nursing note and vitals reviewed.   ED Course  Procedures (including critical care time)  Medications - No data to display  Patient Vitals for the past 24 hrs:  BP Temp Temp src Pulse Resp SpO2 Height Weight  11/17/15 1913 134/77 mmHg - - (!) 59 18 97 % - -  11/17/15 1712 192/92 mmHg 98.4 F (36.9 C) Oral 65 18 94 % 5\' 3"  (1.6 m) 229 lb (103.874 kg)    7:18 PM Reevaluation with update  and discussion. After initial assessment and treatment, an updated evaluation reveals blood pressure is normal. Findings discussed with the patient, all questions answered. Leatha Rohner L    Labs Review Labs Reviewed  BASIC METABOLIC PANEL - Abnormal; Notable for the following:    Chloride 113 (*)    CO2 20 (*)    Glucose, Bld 106 (*)    BUN 30 (*)    Creatinine, Ser 2.23 (*)    GFR calc non Af Amer 23 (*)    GFR calc Af Amer 26 (*)    All other components within normal limits  CBC - Abnormal; Notable for the following:    WBC 11.8 (*)  Hemoglobin 11.3 (*)    All other components within normal limits  TROPONIN I    Imaging Review No results found. I have personally reviewed and evaluated these images and lab results as part of my medical decision-making.   EKG Interpretation   Date/Time:  Friday November 17 2015 17:15:17 EST Ventricular Rate:  66 PR Interval:  200 QRS Duration: 68 QT Interval:  386 QTC Calculation: 404 R Axis:   7 Text Interpretation:  Normal sinus rhythm Normal ECG since last tracing no  significant change Confirmed by Eulis Foster  MD, Alyx Mcguirk [id number]) on 11/17/2015  7:16:16 PM      MDM   Final diagnoses:  Secondary hypertension, unspecified    Control blood pressure with mild chronic ongoing symptoms. Doubt exacerbation of venous thromboembolism, intracranial bleeding, serious bacterial infection or metabolic instability.   Nursing Notes Reviewed/ Care Coordinated Applicable Imaging Reviewed Interpretation of Laboratory Data incorporated into ED treatment  The patient appears reasonably screened and/or stabilized for discharge and I doubt any other medical condition or other [facility] requiring further screening, evaluation, or treatment in the ED at this time prior to discharge.  Plan: Home Medications- usual; Home Treatments- rest; return here if the recommended treatment, does not improve the symptoms; Recommended follow up- PCP prn     Daleen Bo, MD 11/17/15 1919

## 2015-11-17 NOTE — ED Notes (Signed)
Pt sent her by home health care RN for hypertension, headache and L arm pain.  PT was discharged from [facility] on 10/26 after being tx for PE and started on coumadin.  States ever since she was placed on coumadin she has been having headaches.  Pt neuro intact.

## 2015-11-17 NOTE — ED Notes (Signed)
EDP at bedside  

## 2015-11-17 NOTE — ED Notes (Signed)
Patient brought back to room via wheelchair with family in tow; patient was upset and refusing to place gown on even with explanation of why she should change into gown then patient's son stated "momma stop of of this being difficult with every one. You know you have to put on that gown when you come here. If you keep acting like this then I'm going to leave"; patient replies back "well leave then I didn't have to do all of this before the last time I came"; patient's son replied "just stop it"; I informed the patient if she didn't want to place the gown on then I would let the doctor and nurse know; patient stated "well it doesn't make any sense"; informed the patient she didn't need to get fully undressed just her shirt and bra and that the doctor or nurse would shortly be with her"; patient then took the gown from me; patient's son at bedside; informed Tori, RN of situation

## 2015-11-17 NOTE — Discharge Instructions (Signed)
Hypertension  Hypertension is another name for high blood pressure. High blood pressure forces your heart to work harder to pump blood. A blood pressure reading has two numbers, which includes a higher number over a lower number (example: 110/72).  HOME CARE    Have your blood pressure rechecked by your doctor.   Only take medicine as told by your doctor. Follow the directions carefully. The medicine does not work as well if you skip doses. Skipping doses also puts you at risk for problems.   Do not smoke.   Monitor your blood pressure at home as told by your doctor.  GET HELP IF:   You think you are having a reaction to the medicine you are taking.   You have repeat headaches or feel dizzy.   You have puffiness (swelling) in your ankles.   You have trouble with your vision.  GET HELP RIGHT AWAY IF:    You get a very bad headache and are confused.   You feel weak, numb, or faint.   You get chest or belly (abdominal) pain.   You throw up (vomit).   You cannot breathe very well.  MAKE SURE YOU:    Understand these instructions.   Will watch your condition.   Will get help right away if you are not doing well or get worse.     This information is not intended to replace advice given to you by your health care provider. Make sure you discuss any questions you have with your health care provider.     Document Released: 06/03/2008 Document Revised: 12/21/2013 Document Reviewed: 10/08/2013  Elsevier Interactive Patient Education 2016 [employer]

## 2015-11-29 DIAGNOSIS — J449 Chronic obstructive pulmonary disease, unspecified: Secondary | ICD-10-CM | POA: Diagnosis not present

## 2015-11-29 DIAGNOSIS — I5032 Chronic diastolic (congestive) heart failure: Secondary | ICD-10-CM | POA: Diagnosis not present

## 2015-11-29 DIAGNOSIS — I2699 Other pulmonary embolism without acute cor pulmonale: Secondary | ICD-10-CM | POA: Diagnosis not present

## 2015-11-29 DIAGNOSIS — I130 Hypertensive heart and chronic kidney disease with heart failure and stage 1 through stage 4 chronic kidney disease, or unspecified chronic kidney disease: Secondary | ICD-10-CM | POA: Diagnosis not present

## 2015-11-29 DIAGNOSIS — I82412 Acute embolism and thrombosis of left femoral vein: Secondary | ICD-10-CM | POA: Diagnosis not present

## 2015-11-29 DIAGNOSIS — N184 Chronic kidney disease, stage 4 (severe): Secondary | ICD-10-CM | POA: Diagnosis not present

## 2015-12-05 ENCOUNTER — Ambulatory Visit (HOSPITAL_BASED_OUTPATIENT_CLINIC_OR_DEPARTMENT_OTHER): Payer: Medicare Other | Admitting: Hematology

## 2015-12-05 ENCOUNTER — Encounter: Payer: Self-pay | Admitting: Hematology

## 2015-12-05 VITALS — BP 119/93 | HR 63 | Temp 98.9°F | Resp 18 | Ht 63.0 in | Wt 232.2 lb

## 2015-12-05 DIAGNOSIS — N6320 Unspecified lump in the left breast, unspecified quadrant: Secondary | ICD-10-CM

## 2015-12-05 DIAGNOSIS — I2699 Other pulmonary embolism without acute cor pulmonale: Secondary | ICD-10-CM

## 2015-12-05 DIAGNOSIS — D72829 Elevated white blood cell count, unspecified: Secondary | ICD-10-CM | POA: Diagnosis not present

## 2015-12-05 DIAGNOSIS — I82412 Acute embolism and thrombosis of left femoral vein: Secondary | ICD-10-CM | POA: Diagnosis not present

## 2015-12-05 DIAGNOSIS — N63 Unspecified lump in breast: Secondary | ICD-10-CM | POA: Diagnosis not present

## 2015-12-05 NOTE — Progress Notes (Signed)
[facility]  [phone] [phone]  Clinic follow up Note   Patient Care Team: Glendale Chard, MD as PCP - General (Internal Medicine) Boyce Medici, FNP as Nurse Practitioner (Nurse Practitioner) 12/05/2015  CHIEF COMPLAINTS:  Follow up abnormal mammogram   HISTORY OF PRESENTING ILLNESS (08/14/2015):  Whitney Walker 59 y.o. female is referred here by her PCP Dr. Baird Cancer for mild leukocytosis.   She was previously seen by Dr. Jamse Arn in 2013 for leukocytosis. Her WBC has been around 11-18K since 08/2009, with predominantly neutrophils. BCR/ABL and check to mutation were checked in 2013 which were all negative. She also had a mild anemia in the past with hemoglobin in the 11-12 range, her platelet count has been normal.  She had PE in 10/2013 and was treated with Xaralto until 11/2014, she is now on aspirin. She has PVD and had left femoral popliteal vein bypass surgery in 07/2013. She also has COPD, she did use oxygen after she had PE, but not any more since Nov 2015 due to financial reasons. She has mild to moderate dyspenia on exertion.   She had tried quitting smoking a few times and finally stopped about 5 weeks ago.   She has had sinus infection on monthly bases, and recently caught a cold from her grand-grandson. She has headache for the past 4-5 month. She is being referred to see a neurologist.   She had early stage colon cancer in 2009, had surgical resection and no adjuvant chemo was needed. She had last colonoscopy in 05/2012  which was unremarkable. Her last mammogram was 2 years ago and she is planning to have a repeated one soon.   INTERIM HISTORY Amiel returns for follow up. I ordered a screening mammogram for her on last visit, and she finally had it done on 11/08/2015. It reviewed a 5 mm mass in the left breast 11:00 position. Biopsy was recommended. I called patient, and she was very stressed and overwhelmed, and I scheduled her to come  in to discuss further.  She was admitted to hospital on 10/17/2015 for DVT and PE, was discharged home on Coumadin. Her Coumadin has been monitored by her primary care physician. She was told she could not to the breast biopsy because she is on Coumadin. And she declines to transition her cone to Lovenox injection prior to breast biopsy. She has recently moved to a new place in [place], has been quite busy cause of the moving.   MEDICAL HISTORY:  Past Medical History  Diagnosis Date  . Anxiety   . Arthritis   . COPD (chronic obstructive pulmonary disease) ([place])   . Depression   . Hypertension   . Meningitis   . Vertigo   . Colon cancer ([place])   . Peripheral vascular disease ([place])   . CKD (chronic kidney disease)     Stage III 03/2013  . Shortness of breath   . CHF (congestive heart failure) ([place])   . PE (pulmonary embolism)   . Coronary artery disease   . Sleep apnea   . Anemia     SURGICAL HISTORY: Past Surgical History  Procedure Laterality Date  . Cesarean section      X 3 [id number]  . Cholecystectomy    . Abdominal hysterectomy    . Diverticulosis surgery-2002  2012  . Colectomy  2010  . Cholecystectomy  1980/s  . Femoral-popliteal bypass graft Left 08/13/2013    Procedure: BYPASS GRAFT FEMORAL-POPLITEAL ARTERY WITH NON-REVERSED SAPHANEOUS VEIN;  ULTRASOUND GUIDED;  Surgeon: Mal [name], MD;  Location: [facility];  Service: Vascular;  Laterality: Left;  . Abdominal aortagram N/A 07/26/2013    Procedure: ABDOMINAL Maxcine Ham;  Surgeon: [name] Brule, MD;  Location[facility] C[facility] LAB;  Service: Cardiovascular;  Laterality: N/A;  . Lower extremity angiogram Left 07/26/2013    Procedure: LOWER EXTREMITY ANGIOGRAM;  Surgeon: [name] Welton, MD;  Location: [facility] CATH LAB;  Service: Cardiovascular;  Laterality: Left;    SOCIAL HISTORY: Social History   Social History  . Marital Status: Single    Spouse Name: N/A  . Number of Children: 4  . Years of Education: 12th gd   Occupational  History  . N/A    Social History Main Topics  . Smoking status: Former Smoker -- 1.50 packs/day for 40 years    Types: Cigarettes    Quit date: 08/09/2014  . Smokeless tobacco: Never Used  . Alcohol Use: No     Comment: she used to drink alcohol, quit in 2010   . Drug Use: No  . Sexual Activity: Not on file   Other Topics Concern  . Not on file   Social History Narrative   2 cups of coffee a day     FAMILY HISTORY: Family History  Problem Relation Age of Onset  . Cancer Mother 53    breast and bone  . Cancer Father 73    prostate  . Cancer Cousin 20    breast cancer   . Hypertension Sister   . Bleeding Disorder Sister   . Hypertension Daughter     ALLERGIES:  is allergic to iohexol.  MEDICATIONS:  Current Outpatient Prescriptions  Medication Sig Dispense Refill  . albuterol (PROVENTIL HFA;VENTOLIN HFA) 108 (90 BASE) MCG/ACT inhaler Inhale 2 puffs into the lungs every 6 (six) hours as needed for wheezing or shortness of breath.    [name] amLODipine (NORVASC) 10 MG tablet Take 10 mg by mouth daily.    [name] BYSTOLIC 10 MG tablet Take 10 mg by mouth daily.    . sodium bicarbonate 650 MG tablet Take 1 tablet (650 mg total) by mouth 2 (two) times daily. 60 tablet 0  . oxyCODONE (OXY IR/ROXICODONE) 5 MG immediate release tablet Take 1 tablet (5 mg total) by mouth every 6 (six) hours as needed for moderate pain. (Patient not taking: Reported on 11/17/2015) 30 tablet 0  . warfarin (COUMADIN) 7.5 MG tablet Take 1 tablet (7.5 mg total) by mouth daily. 14 tablet 0   No current facility-administered medications for this visit.    REVIEW OF SYSTEMS:   Constitutional: Denies fevers, chills or abnormal night sweats Eyes: Denies blurriness of vision, double vision or watery eyes Ears, nose, mouth, throat, and face: Denies mucositis or sore throat Respiratory: Denies cough, dyspnea or wheezes Cardiovascular: Denies palpitation, chest discomfort or lower extremity  swelling Gastrointestinal:  Denies nausea, heartburn or change in bowel habits Skin: Denies abnormal skin rashes Lymphatics: Denies new lymphadenopathy or easy bruising Neurological:Denies numbness, tingling or new weaknesses Behavioral/Psych: Mood is stable, no new changes  All other systems were reviewed with the patient and are negative.  PHYSICAL EXAMINATION: ECOG PERFORMANCE STATUS: 1  Filed Vitals:   12/05/15 1342  BP: 119/93  Pulse: 63  Temp: 98.9 F (37.2 C)  Resp: 18   Filed Weights   12/05/15 1342  Weight: 232 lb 3.2 oz (105.325 kg)    GENERAL:alert, no distress and comfortable SKIN: skin color, texture, turgor are normal, no rashes  or significant lesions EYES: normal, conjunctiva are pink and non-injected, sclera clear OROPHARYNX:no exudate, no erythema and lips, buccal mucosa, and tongue normal  NECK: supple, thyroid normal size, non-tender, without nodularity LYMPH:  no palpable lymphadenopathy in the cervical, axillary or inguinal LUNGS: clear to auscultation and percussion with normal breathing effort HEART: regular rate & rhythm and no murmurs and no lower extremity edema ABDOMEN:abdomen soft, non-tender and normal bowel sounds Musculoskeletal:no cyanosis of digits and no clubbing  PSYCH: alert & oriented x 3 with fluent speech NEURO: no focal motor/sensory deficits Breast: pt declined breast exam today   LABORATORY DATA:  I have reviewed the data as listed CBC Latest Ref Rng 11/17/2015 10/25/2015 10/24/2015  WBC 4.0 - 10.5 K/uL 11.8(H) 11.1(H) 13.1(H)  Hemoglobin 12.0 - 15.0 g/dL 11.3(L) 11.6(L) 11.5(L)  Hematocrit 36.0 - 46.0 % 36.9 36.9 36.5  Platelets 150 - 400 K/uL 199 320 346    CMP Latest Ref Rng 11/17/2015 10/25/2015 10/24/2015  Glucose 65 - 99 mg/dL 106(H) 116(H) 103(H)  BUN 6 - 20 mg/dL 30(H) 48(H) 49(H)  Creatinine 0.44 - 1.00 mg/dL 2.23(H) 2.84(H) 3.08(H)  Sodium 135 - 145 mmol/L 138 132(L) 135  Potassium 3.5 - 5.1 mmol/L 4.7 5.0 5.0   Chloride 101 - 111 mmol/L 113(H) 110 109  CO2 22 - 32 mmol/L 20(L) 14(L) 18(L)  Calcium 8.9 - 10.3 mg/dL 9.3 9.5 9.8  Total Protein 6.5 - 8.1 g/dL - - -  Total Bilirubin 0.3 - 1.2 mg/dL - - -  Alkaline Phos 38 - 126 U/L - - -  AST 15 - 41 U/L - - -  ALT 14 - 54 U/L - - -     RADIOGRAPHIC STUDIES: I have personally reviewed the radiological images as listed and agreed with the findings in the report. US Breast Ltd Uni Left Inc Axilla  11/08/2015  CLINICAL DATA:  Left breast upper inner quadrant nodule seen on most recent screening mammography. EXAM: DIGITAL DIAGNOSTIC LEFT MAMMOGRAM WITH 3D TOMOSYNTHESIS WITH CAD ULTRASOUND LEFT BREAST COMPARISON:  Previous exam(s). ACR Breast Density Category b: There are scattered areas of fibroglandular density. FINDINGS: Additional mammographic views of the left breast demonstrate persistent sub cm spiculated nodule in left breast upper inner quadrant, anterior depth. There are associated faint calcifications. Mammographic images were processed with CAD. On physical exam, no suspicious masses are found. Targeted ultrasound is performed, showing left breast 11 o'clock 4 cm from the nipple hypoechoic slightly irregular nodule measuring 0.4 x 0.5 x 0.5 cm. Prominent echogenic halo is noted. There is also an associated posterior acoustic shadowing. Ultrasound examination of the left axilla demonstrates at least 1 indeterminate in appearance left axillary lymph node, with somewhat thickened cortex. IMPRESSION: Left breast 11 o'clock suspicious nodule, for which ultrasound-guided core needle biopsy is recommended. One indeterminate left axillary lymph node. RECOMMENDATION: Ultrasound-guided core needle biopsy of the left breast. Patient is being treated I have discussed the findings and recommendations with the patient. Results were also provided in writing at the conclusion of the visit. If applicable, a reminder letter will be sent to the patient regarding the next  appointment. BI-RADS CATEGORY  4: Suspicious. Electronically Signed   By: Fidela Salisbury M.D.   On: 11/08/2015 14:07   Mm Diag Breast Tomo Uni Left  11/08/2015  CLINICAL DATA:  Left breast upper inner quadrant nodule seen on most recent screening mammography. EXAM: DIGITAL DIAGNOSTIC LEFT MAMMOGRAM WITH 3D TOMOSYNTHESIS WITH CAD ULTRASOUND LEFT BREAST COMPARISON:  Previous exam(s). ACR Breast  Density Category b: There are scattered areas of fibroglandular density. FINDINGS: Additional mammographic views of the left breast demonstrate persistent sub cm spiculated nodule in left breast upper inner quadrant, anterior depth. There are associated faint calcifications. Mammographic images were processed with CAD. On physical exam, no suspicious masses are found. Targeted ultrasound is performed, showing left breast 11 o'clock 4 cm from the nipple hypoechoic slightly irregular nodule measuring 0.4 x 0.5 x 0.5 cm. Prominent echogenic halo is noted. There is also an associated posterior acoustic shadowing. Ultrasound examination of the left axilla demonstrates at least 1 indeterminate in appearance left axillary lymph node, with somewhat thickened cortex. IMPRESSION: Left breast 11 o'clock suspicious nodule, for which ultrasound-guided core needle biopsy is recommended. One indeterminate left axillary lymph node. RECOMMENDATION: Ultrasound-guided core needle biopsy of the left breast. Patient is being treated I have discussed the findings and recommendations with the patient. Results were also provided in writing at the conclusion of the visit. If applicable, a reminder letter will be sent to the patient regarding the next appointment. BI-RADS CATEGORY  4: Suspicious. Electronically Signed   By: Fidela Salisbury M.D.   On: 11/08/2015 14:07    ASSESSMENT & PLAN:  59 year old female with chronic mild leukocytosis and anemia.  1. Left breast mass -this was discovered by screening mammogram, new from previous  mammogram in 2013 -I encourage her to have biopsy to rule out malignancy. -Due to her recent DVT and PE, and her unwillingness to bridging over to heparin injection, I recommend her to have the biopsy in Jan, after 3 months of anticoagulation. -I had a long discussion with her about the rationale for biopsy, potential complications from biopsy, and the logistics. She finally agreed. I'll correlate with her primary care physician about her anticoagulation.  2. Recurrent DVT and PE -She previously had episodes of PE, was treated with Xarelto. 10 months after she came off Xarelto, she developed unprovoked DVT and PE. -I recommend her to continue Coumadin indefinitely, if no contraindications. -Due to her chronic kidney disease, other options of anticoagulation are very limited.  3. Mild leukocytosis, with predominant neutrophils -Her total white count today is 13.6 K, with neutrophil 9.5K, and the rest of the white cell within normal limits. Her mild leukocytosis has been stable over the past to 3 years, previous workup was negative for BCR/ABL or JAK 2 mutation. This is likely reactive, secondary to her smoking and chronic inflammations (allergy and sinus Infection). No evidence of myeloproliferative neoplasm.  -Follow-up CBC once a year  4. Mild normocytic anemia, likely secondary to her CKD -She had hemoglobin 11-12 in the past a few years, overall stable, likely related to her stage III chronic kidney disease  -. His iron study and SPEP were normal in 2013, we'll repeated next year  -Follow-up CBC once a year   5. History of colon cancer in 2009  -She has computed 5 years of surveillance.  -last colonoscopy in 05/2012, next due 2018   6. CKD, stage IV -follow up with primary care physician  Plan -I recommend her to have left breast biopsy in mid January 2017. She is to come off Coumadin 7 days before the biopsy, and resume today after biopsy if no bleeding. -She follows up as the Coumadin  clinic in her primary care physician's office, I'll coordinate her anticoagulation with her PCP -I'll see her back after her breast biopsy   All questions were answered. The patient knows to call the clinic with  any problems, questions or concerns. I spent 30 minutes counseling the patient face to face. The total time spent in the appointment was 40 minutes and more than 50% was on counseling.     Truitt Merle, MD 12/05/2015

## 2015-12-07 DIAGNOSIS — I82412 Acute embolism and thrombosis of left femoral vein: Secondary | ICD-10-CM | POA: Diagnosis not present

## 2015-12-07 DIAGNOSIS — I130 Hypertensive heart and chronic kidney disease with heart failure and stage 1 through stage 4 chronic kidney disease, or unspecified chronic kidney disease: Secondary | ICD-10-CM | POA: Diagnosis not present

## 2015-12-07 DIAGNOSIS — I2699 Other pulmonary embolism without acute cor pulmonale: Secondary | ICD-10-CM | POA: Diagnosis not present

## 2015-12-07 DIAGNOSIS — J449 Chronic obstructive pulmonary disease, unspecified: Secondary | ICD-10-CM | POA: Diagnosis not present

## 2015-12-07 DIAGNOSIS — I5032 Chronic diastolic (congestive) heart failure: Secondary | ICD-10-CM | POA: Diagnosis not present

## 2015-12-07 DIAGNOSIS — N184 Chronic kidney disease, stage 4 (severe): Secondary | ICD-10-CM | POA: Diagnosis not present

## 2015-12-08 ENCOUNTER — Telehealth: Payer: Self-pay | Admitting: *Deleted

## 2015-12-08 DIAGNOSIS — Z86718 Personal history of other venous thrombosis and embolism: Secondary | ICD-10-CM | POA: Diagnosis not present

## 2015-12-08 DIAGNOSIS — R0609 Other forms of dyspnea: Secondary | ICD-10-CM | POA: Diagnosis not present

## 2015-12-08 DIAGNOSIS — Z86711 Personal history of pulmonary embolism: Secondary | ICD-10-CM | POA: Diagnosis not present

## 2015-12-08 DIAGNOSIS — I10 Essential (primary) hypertension: Secondary | ICD-10-CM | POA: Diagnosis not present

## 2015-12-08 NOTE — Telephone Encounter (Signed)
Pt called requested a call back from nurse  - after 3 or 4 pm - for questions about Coumadin that pt was taking.  Called pt back and left message on voice mail for a return call from pt. Pt's   Phone    [phone].

## 2015-12-10 DIAGNOSIS — N6320 Unspecified lump in the left breast, unspecified quadrant: Secondary | ICD-10-CM | POA: Insufficient documentation

## 2015-12-11 DIAGNOSIS — I82412 Acute embolism and thrombosis of left femoral vein: Secondary | ICD-10-CM | POA: Diagnosis not present

## 2015-12-11 DIAGNOSIS — J449 Chronic obstructive pulmonary disease, unspecified: Secondary | ICD-10-CM | POA: Diagnosis not present

## 2015-12-11 DIAGNOSIS — I5032 Chronic diastolic (congestive) heart failure: Secondary | ICD-10-CM | POA: Diagnosis not present

## 2015-12-11 DIAGNOSIS — I130 Hypertensive heart and chronic kidney disease with heart failure and stage 1 through stage 4 chronic kidney disease, or unspecified chronic kidney disease: Secondary | ICD-10-CM | POA: Diagnosis not present

## 2015-12-11 DIAGNOSIS — N184 Chronic kidney disease, stage 4 (severe): Secondary | ICD-10-CM | POA: Diagnosis not present

## 2015-12-11 DIAGNOSIS — I2699 Other pulmonary embolism without acute cor pulmonale: Secondary | ICD-10-CM | POA: Diagnosis not present

## 2015-12-14 DIAGNOSIS — I2699 Other pulmonary embolism without acute cor pulmonale: Secondary | ICD-10-CM | POA: Diagnosis not present

## 2015-12-14 DIAGNOSIS — I5032 Chronic diastolic (congestive) heart failure: Secondary | ICD-10-CM | POA: Diagnosis not present

## 2015-12-14 DIAGNOSIS — J449 Chronic obstructive pulmonary disease, unspecified: Secondary | ICD-10-CM | POA: Diagnosis not present

## 2015-12-14 DIAGNOSIS — I130 Hypertensive heart and chronic kidney disease with heart failure and stage 1 through stage 4 chronic kidney disease, or unspecified chronic kidney disease: Secondary | ICD-10-CM | POA: Diagnosis not present

## 2015-12-14 DIAGNOSIS — N184 Chronic kidney disease, stage 4 (severe): Secondary | ICD-10-CM | POA: Diagnosis not present

## 2015-12-14 DIAGNOSIS — I82412 Acute embolism and thrombosis of left femoral vein: Secondary | ICD-10-CM | POA: Diagnosis not present

## 2015-12-22 DIAGNOSIS — I130 Hypertensive heart and chronic kidney disease with heart failure and stage 1 through stage 4 chronic kidney disease, or unspecified chronic kidney disease: Secondary | ICD-10-CM | POA: Diagnosis not present

## 2015-12-22 DIAGNOSIS — N184 Chronic kidney disease, stage 4 (severe): Secondary | ICD-10-CM | POA: Diagnosis not present

## 2015-12-22 DIAGNOSIS — I82412 Acute embolism and thrombosis of left femoral vein: Secondary | ICD-10-CM | POA: Diagnosis not present

## 2015-12-22 DIAGNOSIS — I5032 Chronic diastolic (congestive) heart failure: Secondary | ICD-10-CM | POA: Diagnosis not present

## 2015-12-22 DIAGNOSIS — I2699 Other pulmonary embolism without acute cor pulmonale: Secondary | ICD-10-CM | POA: Diagnosis not present

## 2015-12-22 DIAGNOSIS — J449 Chronic obstructive pulmonary disease, unspecified: Secondary | ICD-10-CM | POA: Diagnosis not present

## 2016-01-03 DIAGNOSIS — I129 Hypertensive chronic kidney disease with stage 1 through stage 4 chronic kidney disease, or unspecified chronic kidney disease: Secondary | ICD-10-CM | POA: Diagnosis not present

## 2016-01-03 DIAGNOSIS — R7309 Other abnormal glucose: Secondary | ICD-10-CM | POA: Diagnosis not present

## 2016-01-03 DIAGNOSIS — N183 Chronic kidney disease, stage 3 (moderate): Secondary | ICD-10-CM | POA: Diagnosis not present

## 2016-01-03 DIAGNOSIS — F329 Major depressive disorder, single episode, unspecified: Secondary | ICD-10-CM | POA: Diagnosis not present

## 2016-01-29 ENCOUNTER — Telehealth: Payer: Self-pay | Admitting: *Deleted

## 2016-01-29 NOTE — Telephone Encounter (Signed)
Per Dr. Burr Medico, notified pt that MD has spoken with radiologist at the breast center and they will call her for biopsy appt.  Instructions given to hold Eloquis on the day of biopsy, and resume the day after the biopsy.  Pt verbalized understanding and confirmed appt 2/6 with MD, ok to reschedule it after her breast biopsy.  Pt verbalized understanding.

## 2016-01-29 NOTE — Telephone Encounter (Signed)
Amy,  Please call pt and let her know that I have spoken with one of the radiologist at the breast center and they will call her for biopsy appointment. I recommend her to hold Eloquis on the day of biopsy, and resume the day after the biopsy.   She has appointment with me on 2/6, OK to reschedule it after her breast biopsy.   Thanks  Truitt Merle

## 2016-01-29 NOTE — Telephone Encounter (Signed)
"  I've been awaiting a call for a biopsy to be done.  I've had the breast ultrasound.  Could not have biopsy due to coumadin.  I am now on eloquis.  Blood thinner was changed after pulmonary embolus was found last month.  The cardiologist sent a note with Eloquis refills for further refills to come from my PCP.  Nothing's been scheduled yet.  Return number [phone]."

## 2016-01-30 NOTE — Telephone Encounter (Signed)
Pt called asking when her appt was on 2/6. This triage nurse saw no appts scheduled. Called pt to clarify. She is waiting for information about her sister who is also having a biopsy soon. In [place]. She is wanting to be with her sister if possible. Whitney Walker will schedule her biopsy then call us to schedule with Dr Burr Medico. She will call us if these need to be changed d/t her sister's situation. No POF was seen for appt on the 6th.

## 2016-01-31 NOTE — Telephone Encounter (Signed)
Received call from [facility] regarding Eloquis & new anticoag protocol per Dr Rosana Hoes.  Returned call & Jeani Hawking reports that they have a new Market researcher who has researched information & doesn't see the need to hold Eloquis for biopsy.  Biopsy is scheduled for 02/06/16.  Jeani Hawking will fax over the new anticoag protocol for Dr Burr Medico to review & Dr Rosana Hoes will be glad to discuss with her if necessary. Message to Dr Burr Medico.

## 2016-02-01 ENCOUNTER — Telehealth: Payer: Self-pay | Admitting: *Deleted

## 2016-02-01 NOTE — Telephone Encounter (Signed)
Talked with pt & informed that Radiology Director at [facility] doesn't see need for her to hold her Eloquis & Dr Burr Medico will defer to them so even though she had originally told her to hold x 1 day, Radiologist says doesn't need to hold so take eloquis as instructed per Radiology.  She states that she has already talked with RN at the Breast center & was given this info & expressed understanding.

## 2016-02-01 NOTE — Telephone Encounter (Signed)
Left message at home # for pt to return call to discuss eloquis & biopsy.

## 2016-02-05 ENCOUNTER — Ambulatory Visit (INDEPENDENT_AMBULATORY_CARE_PROVIDER_SITE_OTHER): Payer: Medicare Other | Admitting: Neurology

## 2016-02-05 ENCOUNTER — Encounter: Payer: Self-pay | Admitting: Neurology

## 2016-02-05 VITALS — BP 138/80 | HR 78 | Resp 20 | Ht 63.0 in | Wt 238.0 lb

## 2016-02-05 DIAGNOSIS — F439 Reaction to severe stress, unspecified: Secondary | ICD-10-CM

## 2016-02-05 DIAGNOSIS — Z789 Other specified health status: Secondary | ICD-10-CM | POA: Diagnosis not present

## 2016-02-05 DIAGNOSIS — G4731 Primary central sleep apnea: Secondary | ICD-10-CM | POA: Diagnosis not present

## 2016-02-05 DIAGNOSIS — R413 Other amnesia: Secondary | ICD-10-CM

## 2016-02-05 DIAGNOSIS — E669 Obesity, unspecified: Secondary | ICD-10-CM | POA: Diagnosis not present

## 2016-02-05 DIAGNOSIS — Z638 Other specified problems related to primary support group: Secondary | ICD-10-CM | POA: Diagnosis not present

## 2016-02-05 DIAGNOSIS — G4733 Obstructive sleep apnea (adult) (pediatric): Secondary | ICD-10-CM

## 2016-02-05 NOTE — Progress Notes (Signed)
Subjective:    Patient ID: Whitney Walker is a 60 y.o. female.  HPI     Interim history:   Whitney Walker is a 60 year old right-handed woman with a complex medical history of hypertension, obesity, congestive heart failure, chronic kidney disease, COPD, peripheral vascular disease, DVT and PE in 2014, prior smoking and secondhand smoke exposure, who presents for follow-up consultation of her memory loss and obstructive sleep apnea. The patient is unaccompanied today. I last saw her on 10/02/2015, at which time she reported trouble tolerating BiPAP. She felt he was uncomfortable. She also reported stress, her blood pressure was higher as well. She was having headaches. She was not fully compliant with BiPAP therapy. She never started her new diabetes medication she reported. She also said she needed to move. On the positive side, she felt improved and her daytime energy and nocturia after starting BiPAP therapy.   Today, 02/05/2016: I reviewed her BiPAP compliance data from 12/04/2015 through 01/02/2016 which is a total of 30 days during which time she used her machine only 2 days, indicating noncompliance.  Today, 02/05/2016: She reports a lot of recent stressors. She went to ER with SOB on 10/17 and admitted from 10/18 to 10/25/15 for new PE, and L leg DVT. She also had increase in depression and started on Effexor XR. She had to move places. She was in [place] for about three weeks and now her GD is staying with her and patient helps take of her 6 mo great-grandson at night, which is why she is not using her BiPAP, plus she had left her machine in [place]. She was on Xarelto, then ASA, then coumadin, now Eliquis (per Dr. Einar Gip). She is worried about her younger sister and her health issues. The patient herself has to have a breast biopsy. She is not sure who is prescribing her blood pressure medication, amlodipine and bysystolic.  Previously:   I first met her on 05/04/2015 at the request of her  primary care provider, at which time she reported memory loss for the past 6 months. She reported forgetfulness. She also reported a prior diagnosis of obstructive sleep apnea and using oxygen in the past and she had discontinued both CPAP treatment and oxygen usage at home. She used to live in [place] and moved to [place] to be closer to her children. She had had recent blood work. Her MMSE was 27 out of 30, clock drawing was 2 out of 4, animal fluency was 10/m. She had vascular risk factors. I suggested she work with her primary care physician to optimize her hypertension, lipid management and she was also supposed to start a new diabetes medication. I suggested we proceed with a brain MRI without contrast and a sleep study. She had a split-night sleep study on 05/22/2015 and I went over her test results with her in detail today. Baseline sleep efficiency was severely reduced at 28.1% with a latency to sleep prolonged at 67 minutes and wake after sleep onset long at 86.5 minutes with severe sleep fragmentation noted. She had a markedly elevated arousal index. She had absence of slow-wave sleep and near absence of REM sleep with less than 1% REM sleep. Due to prolonged REM latency. Moderate snoring was noted. She slept almost exclusively on her back. Total AHI was highly elevated at 77 per hour, baseline oxygen saturation 94%, nadir was 87%. She was then titrated on positive airway pressure. Sleep efficiency was 96.5%, latency to sleep only 2.5 minutes and wake  after sleep onset only 6 minutes with mild sleep fragmentation noted. She had 23.9% of slow-wave sleep and an increased percentage of REM sleep at 45.2%. Average oxygen saturation was 93%, nadir was 72% during REM sleep. She was initially started on CPAP at a pressure of 5 cm and gradually increased to 15 cm. Due to residual sleep disordered breathing noted, she was switched to BiPAP therapy starting at 14/10 cm with gradual increase to 19/14 cm.  AHI was 7.3 per hour on the final pressure with supine REM sleep achieved. Oxygen desaturation nadir on the final BiPAP pressure was 90%. Based on her test results I prescribed standard BiPAP therapy for home use at a pressure of 19/14 cm.  She had a brain MRI without contrast on 05/08/2015: This is an abnormal MRI of the brain without contrast showing white matter changes most compatible with small vessel ischemic changes. The extent is more than expected for age. There are no acute findings.  In addition, I personally reviewed the images through the PACS system.  We called her with her test results.  I reviewed her BiPAP compliance data from 08/29/2015 through 09/27/2015 which is a total of 30 days during which time she used her machine 21 days with percent used days greater than 4 hours of only 67%, indicating mildly suboptimal compliance with an average usage of 4 hours and 46 minutes, residual AHI suboptimal at 12.4 per hour, leak acceptable with the 95th percentile at 13.5 L/m on a pressure of 19/14 cm standard BiPAP. Of note, she was compliant with a compliance percentage of 74% when I reviewed her compliance data from 08/09/2015 through 09/27/2015 which is a total of 50 days.  05/04/2015: She reports memory loss in the last 6 months.  She reports primarily forgetfulness. She moved from [place] in 2009 to be closer to her children. She has a son and a daughter. She has grandchildren and great-grandchildren. She lives alone. She is currently not driving as she does not have a car.  I reviewed your office note from 04/05/2015 which you kindly included. Also reviewed laboratory test results from your office from 04/05/2015: Hemoglobin A1c was 6.5, total cholesterol 229, LDL 128, triglycerides 253. Her blood pressure at the time was 160/80. BMI 45.   She has been seen by the vascular and vein specialists [facility]. I reviewed their office note from 01/24/2015. She had left femoropopliteal vein  graft performed in August 2014. She had a PE in November 2014 and was started on Xarelto. She is now on ASA. She has seen Dr. Einar Gip in cardiology. She has seen oncology for anemia and leukocytosis. I reviewed the office note from Dr. Burr Medico from 01/11/2015.  She had a head CT without contrast and CT cervical spine without contrast on 10/31/2011: 1. Normal noncontrast CT appearance of the brain. In addition, personally reviewed the images through the PACS system and agree with the findings. 1.  Negative neck soft tissues except for left greater than right carotid calcified atherosclerosis. 2.  Evidence of pulmonary artery hypertension in the visualized chest (main PA 36 mm in diameter versus ascending aorta 32 mm).  She had a CTH without contrast on 03/09/14: this was reported as negative.  I personally reviewed the images through the PACS system and agree with the findings.   She was diagnosed with OSA years ago when she was still in [place], but gave her CPAP machine to her daughter and she reports that both her son and daughter have severe  OSA. She had an O2 tank at home, but no longer has it. She has not used CPAP in some years. Her sleep study results are not available for reviewed.   She is not on cholesterol medication. She is supposed to start a diabetes medication, but has not picked up the prescription yet.   She reports snoring, gasping sensations in sleep, morning headaches, nocturia x 3-4 per night.   She denies a family history of dementia. She denies Hallucinations or delusions.  Her Past Medical History Is Significant For: Past Medical History  Diagnosis Date  . Anxiety   . Arthritis   . COPD (chronic obstructive pulmonary disease) ([place])   . Depression   . Hypertension   . Meningitis   . Vertigo   . Colon cancer ([place])   . Peripheral vascular disease ([facility])   . CKD (chronic kidney disease)     Stage III 03/2013  . Shortness of breath   . CHF (congestive heart failure)  ([facility])   . PE (pulmonary embolism)   . Coronary artery disease   . Sleep apnea   . Anemia     Her Past Surgical History Is Significant For: Past Surgical History  Procedure Laterality Date  . Cesarean section      X 3 [id number]  . Cholecystectomy    . Abdominal hysterectomy    . Diverticulosis surgery-2002  2012  . Colectomy  2010  . Cholecystectomy  1980/s  . Femoral-popliteal bypass graft Left 08/13/2013    Procedure: BYPASS GRAFT FEMORAL-POPLITEAL ARTERY WITH NON-REVERSED SAPHANEOUS VEIN; ULTRASOUND GUIDED;  Surgeon: Mal [name], MD;  Location: [facility];  Service: Vascular;  Laterality: Left;  . Abdominal aortagram N/A 07/26/2013    Procedure: ABDOMINAL Maxcine Ham;  Surgeon: [name] [place], MD;  Location[facility] C[facility] LAB;  Service: Cardiovascular;  Laterality: N/A;  . Lower extremity angiogram Left 07/26/2013    Procedure: LOWER EXTREMITY ANGIOGRAM;  Surgeon: [name] Hurstbourne, MD;  Location: [facility] CATH LAB;  Service: Cardiovascular;  Laterality: Left;    Her Family History Is Significant For: Family History  Problem Relation Age of Onset  . Cancer Mother 56    breast and bone  . Cancer Father 26    prostate  . Cancer Cousin 20    breast cancer   . Hypertension Sister   . Bleeding Disorder Sister   . Hypertension Daughter     Her Social History Is Significant For: Social History   Social History  . Marital Status: Single    Spouse Name: N/A  . Number of Children: 4  . Years of Education: 12th gd   Occupational History  . N/A    Social History Main Topics  . Smoking status: Former Smoker -- 1.50 packs/day for 40 years    Types: Cigarettes    Quit date: 08/09/2014  . Smokeless tobacco: Never Used  . Alcohol Use: No     Comment: she used to drink alcohol, quit in 2010   . Drug Use: No  . Sexual Activity: Not Asked   Other Topics Concern  . None   Social History Narrative   2 cups of coffee a day     Her Allergies Are:  Allergies  Allergen Reactions  . Iohexol Other  (See Comments)     Code: HIVES, Desc: HIVES W/ "DYE" USED FOR 1ST CT SCAN BUT NOT 2ND, NO PREMEDS USED, PT UNCERTAIN OF CIRCUMSTANCES,,?POSSIBLE MRI CONTRAST ALLERGY, ALL STUDIES DONE "SOMEWHERE" IN [place]//A.C., Onset Date: [id number]   :  Her Current Medications Are:  Outpatient Encounter Prescriptions as of 02/05/2016  Medication Sig  . albuterol (PROVENTIL HFA;VENTOLIN HFA) 108 (90 BASE) MCG/ACT inhaler Inhale 2 puffs into the lungs every 6 (six) hours as needed for wheezing or shortness of breath.  [name] amLODipine (NORVASC) 10 MG tablet Take 10 mg by mouth daily.  [name] Apixaban (ELIQUIS PO) Take by mouth.  . BYSTOLIC 10 MG tablet Take 10 mg by mouth daily.  [name] oxyCODONE (OXY IR/ROXICODONE) 5 MG immediate release tablet Take 1 tablet (5 mg total) by mouth every 6 (six) hours as needed for moderate pain.  . sodium bicarbonate 650 MG tablet Take 1 tablet (650 mg total) by mouth 2 (two) times daily.  . [DISCONTINUED] Vilazodone HCl (VIIBRYD STARTER PACK) 10 & 20 MG KIT Take 1 tablet by mouth daily.  . [DISCONTINUED] warfarin (COUMADIN) 5 MG tablet Take 5 mg by mouth daily.   No facility-administered encounter medications on file as of 02/05/2016.  :  Review of Systems:  Out of a complete 14 point review of systems, all are reviewed and negative with the exception of these symptoms as listed below:   Review of Systems  Neurological:       Patient reports that she left her BiPAP machine in another state during traveling. She has the machine back but has not started back due to taking care of great-grandchild at night.  Feels like memory is worse.     Objective:  Neurologic Exam  Physical Exam Physical Examination:   Filed Vitals:   02/05/16 1347  BP: 138/80  Pulse: 78  Resp: 20   General Examination: The patient is a very pleasant 60 y.o. female in no acute distress. She is calm and cooperative with the exam.   HEENT: Normocephalic, atraumatic, pupils are equal, round and reactive  to light and accommodation. Extraocular tracking shows no saccadic breakdown without nystagmus noted. Hearing is intact. Face is symmetric with no facial masking and normal facial sensation. There is no lip, neck or jaw tremor. Neck is not rigid with intact passive ROM. There are no carotid bruits on auscultation. Oropharynx exam reveals moderate mouth dryness. She has no dentures in today. She has a moderately crowded airway d/t thicker tongue, redundant soft palate and larger uvula. Tonsils are absent. Mallampati is class II. Tongue protrudes centrally and palate elevates symmetrically.    Chest: is clear to auscultation without wheezing, rhonchi or crackles noted.  Heart: sounds are regular and normal without murmurs, rubs or gallops noted.   Abdomen: is soft, non-tender and non-distended with normal bowel sounds appreciated on auscultation.  Extremities: There is 1+ pitting edema in the distal lower extremities bilaterally, L more than R, unchanged. She has some spider veins in the medial aspect of the left distal leg. Pedal pulses are intact.   Skin: is warm and dry with no trophic changes noted.   Musculoskeletal: exam reveals no obvious joint deformities, tenderness or joint swelling or erythema.   Neurologically:  Mental status: The patient is awake and alert, paying good  attention. She is able to completely provide the history. She is oriented to: person, place, time/date, situation, day of week, month of year and year. Her memory, attention, language and knowledge are mildly impaired. There is no aphasia, agnosia, apraxia or anomia. There is a no bradyphrenia. Speech is not hypophonic with no dysarthria noted. Mood is congruent and affect is blunted.   On 05/04/2015: MMSE: 27/30, CDT: 2/4, AFT 10/min.  On 02/05/2016: MMSE:  28/30, CDT: 3/4, AFT: 9/min.   Cranial nerves are as described above under HEENT exam. In addition, shoulder shrug is normal with equal shoulder height noted.  Motor  exam: Normal bulk, and strength for age is noted. Tone is not rigid with absence of cogwheeling. There is no resting/postural or action tremor.   Romberg is negative. Reflexes are 2+ in the upper extremities and 2+ in the lower extremities. Toes are downgoing bilaterally. Fine motor skills: Finger taps, hand movements, and rapid alternating patting are not impaired bilaterally. Foot taps and foot agility are note impaired bilaterally.   Cerebellar testing shows no dysmetria or intention tremor. There is no truncal or gait ataxia.   Sensory exam is intact to light touch in the upper and lower extremities.   Gait, station and balance: She stands up from the seated position with no difficulty. No veering to one side is noted. No leaning to one side. Posture is age-appropriate. Stance is narrow-based. Gait and balance are unremarkable.   Assessment and Plan:    In summary, ELODIA HAVILAND is a very pleasant 60 year old female with a complex medical history of hypertension, obesity, congestive heart failure, chronic kidney disease, COPD, peripheral vascular disease, DVT and PE in 2014, and recurrent DVT and PE in October 2016, now on Eliquis, prior smoking with ongoing second hand smoke exposure, who presents for FU of her memory loss and OSA (severe, currently no longer on BiPAP with prior suboptimal compliance). Her physical exam is stable. Her memory scores have thankfully remained stable, I think the situation is confounded by untreated sleep apnea and suboptimally treated depression at this time. We talked about her previous test results which included brain MRI and sleep study results. She has multiple vascular risk factors. She has had some changes in her medications. She has not been fully compliant with her medications and this has been an ongoing issue as well I think. I talked to her about this at length today. She has additional stressors and I do understand that she is taking care of an infant  overnight. Nevertheless, she is strongly advised to go back on treatment with her BiPAP therapy. She is advised to not let any of her medications run out. For prescription refills she is strongly advised to keep appointments with her primary care provider.  I again explained to her that untreated obstructive sleep apnea, especially when moderate to severe, can contribute to cognitive dysfunction, and is an independent stroke and vascular disease risk factor and it can cause headaches and sleep disturbance including insomnia symptoms. She's also advised that taking sleeping pills and not using BiPAP can potentially exacerbate her underlying sleep-disordered breathing each night. At this juncture, I suggested she continue to try to use BiPAP. I placed an order for mask refit. She is encouraged to get in touch with her DME company for this. I will see her back in about 3-4 months, sooner if needed. We will continue to monitor her symptoms and her memory scores. I answered all her questions today and she was in agreement. I spent 25 minutes in total face-to-face time with the patient, more than 50% of which was spent in counseling and coordination of care, reviewing test results, reviewing medication and discussing or reviewing the diagnosis of OSA, memory loss, the prognosis and treatment options.

## 2016-02-05 NOTE — Patient Instructions (Addendum)
Please continue using your BiPAP regularly. While your insurance requires that you use BiPAP at least 4 hours each night on 70% of the nights, I recommend, that you not skip any nights and use it throughout the night if you can. Getting used to BiPAP and staying with the treatment long term does take time and patience and discipline. Untreated obstructive sleep apnea when it is moderate to severe can have an adverse impact on cardiovascular health and raise her risk for heart disease, arrhythmias, hypertension, congestive heart failure, stroke and diabetes. Untreated obstructive sleep apnea causes sleep disruption, nonrestorative sleep, and sleep deprivation. This can have an impact on your day to day functioning and cause daytime sleepiness and impairment of cognitive function, memory loss, mood disturbance, and problems focussing. Using BiPAP regularly can improve these symptoms.  Please call your primary care for refills of your medications.

## 2016-02-06 ENCOUNTER — Ambulatory Visit
Admission: RE | Admit: 2016-02-06 | Discharge: 2016-02-06 | Disposition: A | Discharge status: Home / Self Care | Payer: Medicare Other | Source: Ambulatory Visit | Attending: Hematology | Admitting: Hematology

## 2016-02-06 ENCOUNTER — Other Ambulatory Visit: Payer: Self-pay | Admitting: Hematology

## 2016-02-06 DIAGNOSIS — N641 Fat necrosis of breast: Secondary | ICD-10-CM | POA: Diagnosis not present

## 2016-02-06 DIAGNOSIS — N63 Unspecified lump in breast: Secondary | ICD-10-CM | POA: Diagnosis not present

## 2016-02-06 DIAGNOSIS — N6320 Unspecified lump in the left breast, unspecified quadrant: Secondary | ICD-10-CM

## 2016-02-14 DIAGNOSIS — E785 Hyperlipidemia, unspecified: Secondary | ICD-10-CM | POA: Diagnosis not present

## 2016-02-14 DIAGNOSIS — D631 Anemia in chronic kidney disease: Secondary | ICD-10-CM | POA: Diagnosis not present

## 2016-02-14 DIAGNOSIS — N183 Chronic kidney disease, stage 3 (moderate): Secondary | ICD-10-CM | POA: Diagnosis not present

## 2016-04-03 DIAGNOSIS — N183 Chronic kidney disease, stage 3 (moderate): Secondary | ICD-10-CM | POA: Diagnosis not present

## 2016-04-03 DIAGNOSIS — J309 Allergic rhinitis, unspecified: Secondary | ICD-10-CM | POA: Diagnosis not present

## 2016-04-03 DIAGNOSIS — I129 Hypertensive chronic kidney disease with stage 1 through stage 4 chronic kidney disease, or unspecified chronic kidney disease: Secondary | ICD-10-CM | POA: Diagnosis not present

## 2016-04-03 DIAGNOSIS — F329 Major depressive disorder, single episode, unspecified: Secondary | ICD-10-CM | POA: Diagnosis not present

## 2016-06-05 ENCOUNTER — Ambulatory Visit: Payer: Medicare Other | Admitting: Neurology

## 2016-07-22 ENCOUNTER — Other Ambulatory Visit: Payer: Self-pay | Admitting: Hematology

## 2016-07-22 DIAGNOSIS — M7989 Other specified soft tissue disorders: Secondary | ICD-10-CM

## 2016-07-30 ENCOUNTER — Telehealth: Payer: Self-pay

## 2016-07-30 NOTE — Telephone Encounter (Signed)
I spoke to patient and reminded her to bring in PAP machine to appt.

## 2016-07-31 ENCOUNTER — Ambulatory Visit (INDEPENDENT_AMBULATORY_CARE_PROVIDER_SITE_OTHER): Payer: Medicare Other | Admitting: Neurology

## 2016-07-31 ENCOUNTER — Encounter: Payer: Self-pay | Admitting: Neurology

## 2016-07-31 VITALS — BP 126/60 | HR 72 | Resp 22 | Ht 63.0 in | Wt 234.0 lb

## 2016-07-31 DIAGNOSIS — G4733 Obstructive sleep apnea (adult) (pediatric): Secondary | ICD-10-CM | POA: Diagnosis not present

## 2016-07-31 DIAGNOSIS — R413 Other amnesia: Secondary | ICD-10-CM

## 2016-07-31 DIAGNOSIS — Z789 Other specified health status: Secondary | ICD-10-CM

## 2016-07-31 NOTE — Patient Instructions (Addendum)
I will refer you to ENT for surgical evaluation of your severe sleep apnea.   We will monitor your memory. Follow up in 6 months with one of our nurse practitioners.

## 2016-07-31 NOTE — Progress Notes (Signed)
Subjective:    Patient ID: Whitney Walker is a 60 y.o. female.  HPI     Interim history:   Whitney Walker is a 60 year old right-handed woman with a complex medical history of hypertension, obesity, congestive heart failure, chronic kidney disease, COPD, peripheral vascular disease, DVT and PE in 2014, prior smoking and secondhand smoke exposure, who presents for follow-up consultation of her memory loss and obstructive sleep apnea, with prior non-compliance with BiPAP therapy. The patient is unaccompanied today. I last saw her on 02/05/2016 at which time she was no longer using her BiPAP. She reported a lot of recent stressors. She presented to the emergency room in October 2017 with shortness of breath, she was admitted to the hospital from 10/17/2015 through 10/25/2015, was diagnosed with left leg DVT and PE. She also reported an increase in her depression, for which she was started on Effexor long-acting. She reported that she was taking care of a 90-monthold great-grandson and therefore was not using her BiPAP but [place] She was on Xarelto, then ASA, then coumadin, then Eliquis (per Dr. GEinar Gip. She was worried about her younger sister and her health issues. Patient underwent a recent breast biopsy.  We talked about the importance of medication compliance and treatment compliance with BiPAP. I prescribed mask for her BiPAP machine and a mask refit through her DME.  Today, 07/31/2016: I reviewed her BiPAP compliance data from 02/05/1999 7:15 through 05/05/2016 which is a total of 90 days during which time she used her machine 6 days only, essentially noncompliant with her BiPAP. She reports having had difficulty adjusting to treatment. She was traveling a lot and she just never gotten into the habit of using her BiPAP. She has been traveling a lot, out of state, traveling across states with a friend.    Previously:    I saw her on 10/02/2015, at which time she  reported trouble tolerating BiPAP. She felt he was uncomfortable. She also reported stress, her blood pressure was higher as well. She was having headaches. She was not fully compliant with BiPAP therapy. She never started her new diabetes medication she reported. She also said she needed to move. On the positive side, she felt improved and her daytime energy and nocturia after starting BiPAP therapy.    I reviewed her BiPAP compliance data from 12/04/2015 through 01/02/2016 which is a total of 30 days during which time she used her machine only 2 days, indicating noncompliance.   I first met her on 05/04/2015 at the request of her primary care provider, at which time she reported memory loss for the past 6 months. She reported forgetfulness. She also reported a prior diagnosis of obstructive sleep apnea and using oxygen in the past and she had discontinued both CPAP treatment and oxygen usage at home. [place] She had had recent blood work. Her MMSE was 27 out of 30, clock drawing was 2 out of 4, animal fluency was 10/m. She had vascular risk factors. I suggested she work with her primary care physician to optimize her hypertension, lipid management and she was also supposed to start a new diabetes medication. I suggested we proceed with a brain MRI without contrast and a sleep study. She had a split-night sleep study on 05/22/2015 and I went over her test results with her in detail today. Baseline sleep efficiency was severely reduced at  28.1% with a latency to sleep prolonged at 67 minutes and wake after sleep onset long at 86.5 minutes with severe sleep fragmentation noted. She had a markedly elevated arousal index. She had absence of slow-wave sleep and near absence of REM sleep with less than 1% REM sleep. Due to prolonged REM latency. Moderate snoring was noted. She slept almost exclusively on her back. Total AHI was highly  elevated at 77 per hour, baseline oxygen saturation 94%, nadir was 87%. She was then titrated on positive airway pressure. Sleep efficiency was 96.5%, latency to sleep only 2.5 minutes and wake after sleep onset only 6 minutes with mild sleep fragmentation noted. She had 23.9% of slow-wave sleep and an increased percentage of REM sleep at 45.2%. Average oxygen saturation was 93%, nadir was 72% during REM sleep. She was initially started on CPAP at a pressure of 5 cm and gradually increased to 15 cm. Due to residual sleep disordered breathing noted, she was switched to BiPAP therapy starting at 14/10 cm with gradual increase to 19/14 cm. AHI was 7.3 per hour on the final pressure with supine REM sleep achieved. Oxygen desaturation nadir on the final BiPAP pressure was 90%. Based on her test results I prescribed standard BiPAP therapy for home use at a pressure of 19/14 cm.   She had a brain MRI without contrast on 05/08/2015: This is an abnormal MRI of the brain without contrast showing white matter changes most compatible with small vessel ischemic changes. The extent is more than expected for age. There are no acute findings.   In addition, I personally reviewed the images through the PACS system.   We called her with her test results.   I reviewed her BiPAP compliance data from 08/29/2015 through 09/27/2015 which is a total of 30 days during which time she used her machine 21 days with percent used days greater than 4 hours of only 67%, indicating mildly suboptimal compliance with an average usage of 4 hours and 46 minutes, residual AHI suboptimal at 12.4 per hour, leak acceptable with the 95th percentile at 13.5 L/m on a pressure of 19/14 cm standard BiPAP. Of note, she was compliant with a compliance percentage of 74% when I reviewed her compliance data from 08/09/2015 through 09/27/2015 which is a total of 50 days.   05/04/2015: She reports memory loss in the last 6 months.  She reports primarily  forgetfulness. She moved from [place] in 2009 to be closer to her children. She has a son and a daughter. She has grandchildren and great-grandchildren. She lives alone. She is currently not driving as she does not have a car.   I reviewed your office note from 04/05/2015 which you kindly included. Also reviewed laboratory test results from your office from 04/05/2015: Hemoglobin A1c was 6.5, total cholesterol 229, LDL 128, triglycerides 253. Her blood pressure at the time was 160/80. BMI 45.    [facility]. I reviewed their office note from 01/24/2015. She had left femoropopliteal vein graft performed in August 2014. She had a PE in November 2014 and was started on Xarelto. She is now on ASA. She has seen Dr. Einar Gip in cardiology. She has seen oncology for anemia and leukocytosis. I reviewed the office note from Dr. Burr Medico from 01/11/2015.   She had a head CT without contrast and CT cervical spine without contrast on 10/31/2011: 1. Normal noncontrast CT appearance of the brain. In addition, personally reviewed the images  through the PACS system and agree with the findings. 1.  Negative neck soft tissues except for left greater than right carotid calcified atherosclerosis. 2.  Evidence of pulmonary artery hypertension in the visualized chest (main PA 36 mm in diameter versus ascending aorta 32 mm).   She had a CTH without contrast on 03/09/14: this was reported as negative.  I personally reviewed the images through the PACS system and agree with the findings.    She was diagnosed with OSA years ago when she was still in [place], but gave her CPAP machine to her daughter and she reports that both her son and daughter have severe OSA. She had an O2 tank at home, but no longer has it. She has not used CPAP in some years. Her sleep study results are not available for reviewed.    She is not on cholesterol medication. She is supposed to start a  diabetes medication, but has not picked up the prescription yet.    She reports snoring, gasping sensations in sleep, morning headaches, nocturia x 3-4 per night.   She denies a family history of dementia. She denies Hallucinations or delusions.  Her Past Medical History Is Significant For: Past Medical History:  Diagnosis Date  . Anemia   . Anxiety   . Arthritis   . CHF (congestive heart failure) ([place])   . CKD (chronic kidney disease)    Stage III 03/2013  . Colon cancer ([place])   . COPD (chronic obstructive pulmonary disease) ([place])   . Coronary artery disease   . Depression   . Hypertension   . Meningitis   . PE (pulmonary embolism)   . Peripheral vascular disease ([place])   . Shortness of breath   . Sleep apnea   . Vertigo     Her Past Surgical History Is Significant For: Past Surgical History:  Procedure Laterality Date  . ABDOMINAL AORTAGRAM N/A 07/26/2013   Procedure: ABDOMINAL Maxcine Ham;  Surgeon: [name] [place], MD;  Location: [facility] CATH LAB;  Service: Cardiovascular;  Laterality: N/A;  . ABDOMINAL HYSTERECTOMY    . CESAREAN SECTION     X 3 [id number]  . CHOLECYSTECTOMY    . CHOLECYSTECTOMY  1980/s  . COLECTOMY  2010  . DIVERTICULOSIS SURGERY-2002  2012  . FEMORAL-POPLITEAL BYPASS GRAFT Left 08/13/2013   Procedure: BYPASS GRAFT FEMORAL-POPLITEAL ARTERY WITH NON-REVERSED SAPHANEOUS VEIN; ULTRASOUND GUIDED;  Surgeon: Mal [name], MD;  Location: [facility];  Service: Vascular;  Laterality: Left;  . LOWER EXTREMITY ANGIOGRAM Left 07/26/2013   Procedure: LOWER EXTREMITY ANGIOGRAM;  Surgeon: [name] [place], MD;  Location: [facility] CATH LAB;  Service: Cardiovascular;  Laterality: Left;    Her Family History Is Significant For: Family History  Problem Relation Age of Onset  . Cancer Mother 53    breast and bone  . Cancer Father 6    prostate  . Cancer Cousin 20    breast cancer   . Hypertension Sister   . Bleeding Disorder Sister   . Hypertension Daughter     Her Social History Is  Significant For: Social History   Social History  . Marital status: Single    Spouse name: N/A  . Number of children: 4  . Years of education: 12th gd   Occupational History  . N/A    Social History Main Topics  . Smoking status: Former Smoker    Packs/day: 1.50    Years: 40.00    Types: Cigarettes    Quit date: 08/09/2014  .  Smokeless tobacco: Current User  . Alcohol use No     Comment: she used to drink alcohol, quit in 2010   . Drug use: No  . Sexual activity: Not Asked   Other Topics Concern  . None   Social History Narrative   2 cups of coffee a day     Her Allergies Are:  Allergies  Allergen Reactions  . Iohexol Other (See Comments)     Code: HIVES, Desc: HIVES W/ "DYE" USED FOR 1ST CT SCAN BUT NOT 2ND, NO PREMEDS USED, PT UNCERTAIN OF CIRCUMSTANCES,,?POSSIBLE MRI CONTRAST ALLERGY, ALL STUDIES DONE "SOMEWHERE" IN [place]//A.C., Onset Date: [id number]   :   Her Current Medications Are:  Outpatient Encounter Prescriptions as of 07/31/2016  Medication Sig  . albuterol (PROVENTIL HFA;VENTOLIN HFA) 108 (90 BASE) MCG/ACT inhaler Inhale 2 puffs into the lungs every 6 (six) hours as needed for wheezing or shortness of breath.  [name] amLODipine (NORVASC) 10 MG tablet Take 10 mg by mouth daily.  [name] Apixaban (ELIQUIS PO) Take by mouth.  . cetirizine (ZYRTEC) 10 MG tablet TK 1 T PO QD  . ELIQUIS 5 MG TABS tablet   . labetalol (NORMODYNE) 200 MG tablet   . [DISCONTINUED] BYSTOLIC 10 MG tablet Take 10 mg by mouth daily.  . [DISCONTINUED] oxyCODONE (OXY IR/ROXICODONE) 5 MG immediate release tablet Take 1 tablet (5 mg total) by mouth every 6 (six) hours as needed for moderate pain.  . [DISCONTINUED] sodium bicarbonate 650 MG tablet Take 1 tablet (650 mg total) by mouth 2 (two) times daily.   No facility-administered encounter medications on file as of 07/31/2016.   :  Review of Systems:  Out of a complete 14 point review of systems, all are reviewed and negative with the  exception of these symptoms as listed below:  Review of Systems  Neurological:       Patient states that she has not been using CPAP. She has been doing some traveling. Has trouble using and adjusting to it.     Objective:  Neurologic Exam  Physical Exam Physical Examination:   Vitals:   07/31/16 1406  BP: 126/60  Pulse: 72  Resp: (!) 22   General Examination: The patient is a very pleasant 60 y.o. female in no acute distress. She is calm and cooperative with the exam.   HEENT: Normocephalic, atraumatic, pupils are equal, round and reactive to light and accommodation. Extraocular tracking shows no saccadic breakdown without nystagmus noted. Hearing is intact. Face is symmetric with no facial masking and normal facial sensation. There is no lip, neck or jaw tremor. Neck is not rigid with intact passive ROM. There are no carotid bruits on auscultation. Oropharynx exam reveals moderate mouth dryness. She has edentulous. She has a moderately crowded airway d/t thicker tongue, redundant soft palate and larger uvula. Tonsils are absent. Mallampati is class II. Tongue protrudes centrally and palate elevates symmetrically.    Chest: is clear to auscultation without wheezing, rhonchi or crackles noted.  Heart: sounds are regular and normal without murmurs, rubs or gallops noted.   Abdomen: is soft, non-tender and non-distended with normal bowel sounds appreciated on auscultation.  Extremities: There is 1+ pitting edema in the distal lower extremities bilaterally, L more than R, unchanged. She has some spider veins in the medial aspect of the left distal leg. Pedal pulses are intact.   Skin: is warm and dry with no trophic changes noted.   Musculoskeletal: exam reveals no obvious joint deformities, tenderness or  joint swelling or erythema.   Neurologically:  Mental status: The patient is awake and alert, paying good attention. She is able to completely provide the history. She is oriented  to: person, place, time/date, situation, day of week, month of year and year. Her memory, attention, language and knowledge are mildly impaired. There is no aphasia, agnosia, apraxia or anomia. There is a no bradyphrenia. Speech is not hypophonic with no dysarthria noted. Mood is congruent and affect is blunted.   On 05/04/2015: MMSE: 27/30, CDT: 2/4, AFT 10/min.  On 02/05/2016: MMSE: 28/30, CDT: 3/4, AFT: 9/min.   07/31/2016: MMSE: 28/30, CDT: 3/4, AFT: 9/min.  Cranial nerves are as described above under HEENT exam. In addition, shoulder shrug is normal with equal shoulder height noted.  Motor exam: Normal bulk, and strength for age is noted. Tone is not rigid with absence of cogwheeling. There is no resting/postural or action tremor.   Romberg is negative. Reflexes are 2+ in the upper extremities and 2+ in the lower extremities. Toes are downgoing bilaterally. Fine motor skills: Finger taps, hand movements, and rapid alternating patting are not impaired bilaterally. Foot taps and foot agility are note impaired bilaterally.   Cerebellar testing shows no dysmetria or intention tremor. There is no truncal or gait ataxia.   Sensory exam is intact to light touch in the upper and lower extremities.   Gait, station and balance: She stands up from the seated position with no difficulty. No veering to one side is noted. No leaning to one side. Posture is age-appropriate. Stance is narrow-based. Gait and balance are unremarkable. Tandem walk mildly challenging for her.   Assessment and Plan:    In summary, Whitney Walker is a very pleasant 60 year old female with a complex medical history of hypertension, obesity, congestive heart failure, chronic kidney disease, COPD, peripheral vascular disease, DVT and PE in 2014, and recurrent DVT and PE in October 2016, now on Eliquis, prior smoking with ongoing second hand smoke exposure, who presents for FU of her memory loss and OSA (severe, currently no longer on  BiPAP with prior suboptimal compliance, prior CPAP non-compliance and intolerance reported). Her physical exam is stable. Her memory scores have thankfully remained stable. Her situation is confounded by untreated sleep apnea and suboptimally treated depression, she has had stressors, has been travelling with a gentleman friend of hers, feels, stress is therefore less. She has multiple vascular risk factors. She has not been compliant with BiPAP. I suggest referral to ENT to explore surgical treatment options for her severe OSA. She is strongly advised to keep appointments with her primary care provider and follow up with her cardiologist as well. I again explained to her that untreated obstructive sleep apnea, especially when moderate to severe, can contribute to cognitive dysfunction, and is an independent stroke and vascular disease risk factor and it can cause headaches and sleep disturbance including insomnia symptoms. I placed a referral to ENT. I suggested a 6 month follow up with one of our NPs.  I answered all her questions today and she was in agreement. I spent 25 minutes in total face-to-face time with the patient, more than 50% of which was spent in counseling and coordination of care, reviewing test results, reviewing medication and discussing or reviewing the diagnosis of OSA, memory loss, the prognosis and treatment options.

## 2016-08-02 ENCOUNTER — Other Ambulatory Visit: Payer: Self-pay | Admitting: *Deleted

## 2016-08-02 DIAGNOSIS — D72829 Elevated white blood cell count, unspecified: Secondary | ICD-10-CM

## 2016-08-02 DIAGNOSIS — C189 Malignant neoplasm of colon, unspecified: Secondary | ICD-10-CM

## 2016-08-05 ENCOUNTER — Other Ambulatory Visit (HOSPITAL_BASED_OUTPATIENT_CLINIC_OR_DEPARTMENT_OTHER): Payer: Medicare Other

## 2016-08-05 ENCOUNTER — Other Ambulatory Visit: Payer: Self-pay | Admitting: Hematology

## 2016-08-05 DIAGNOSIS — D72829 Elevated white blood cell count, unspecified: Secondary | ICD-10-CM

## 2016-08-05 DIAGNOSIS — C189 Malignant neoplasm of colon, unspecified: Secondary | ICD-10-CM

## 2016-08-05 LAB — COMPREHENSIVE METABOLIC PANEL
ALT: 24 U/L (ref 0–55)
AST: 19 U/L (ref 5–34)
Albumin: 3 g/dL — ABNORMAL LOW (ref 3.5–5.0)
Alkaline Phosphatase: 120 U/L (ref 40–150)
Anion Gap: 8 mEq/L (ref 3–11)
BUN: 40.6 mg/dL — ABNORMAL HIGH (ref 7.0–26.0)
CO2: 15 mEq/L — ABNORMAL LOW (ref 22–29)
Calcium: 10.1 mg/dL (ref 8.4–10.4)
Chloride: 115 mEq/L — ABNORMAL HIGH (ref 98–109)
Creatinine: 2.6 mg/dL — ABNORMAL HIGH (ref 0.6–1.1)
EGFR: 19 mL/min/{1.73_m2} — ABNORMAL LOW (ref >90)
Glucose: 106 mg/dl (ref 70–140)
Potassium: 4.8 mEq/L (ref 3.5–5.1)
Sodium: 138 mEq/L (ref 136–145)
Total Bilirubin: <0.3 mg/dL (ref 0.20–1.20)
Total Protein: 7.8 g/dL (ref 6.4–8.3)

## 2016-08-05 LAB — CBC WITH DIFFERENTIAL/PLATELET
BASO%: 0.2 % (ref 0.0–2.0)
Basophils Absolute: 0 10*3/uL (ref 0.0–0.1)
EOS%: 1.5 % (ref 0.0–7.0)
Eosinophils Absolute: 0.2 10*3/uL (ref 0.0–0.5)
HCT: 35 % (ref 34.8–46.6)
HGB: 10.9 g/dL — ABNORMAL LOW (ref 11.6–15.9)
LYMPH%: 21 % (ref 14.0–49.7)
MCH: 26.7 pg (ref 25.1–34.0)
MCHC: 31.1 g/dL — ABNORMAL LOW (ref 31.5–36.0)
MCV: 85.6 fL (ref 79.5–101.0)
MONO#: 0.6 10*3/uL (ref 0.1–0.9)
MONO%: 5.3 % (ref 0.0–14.0)
NEUT#: 8.6 10*3/uL — ABNORMAL HIGH (ref 1.5–6.5)
NEUT%: 72 % (ref 38.4–76.8)
Platelets: 233 10*3/uL (ref 145–400)
RBC: 4.09 10*6/uL (ref 3.70–5.45)
RDW: 15.3 % — ABNORMAL HIGH (ref 11.2–14.5)
WBC: 12 10*3/uL — ABNORMAL HIGH (ref 3.9–10.3)
lymph#: 2.5 10*3/uL (ref 0.9–3.3)

## 2016-08-07 ENCOUNTER — Ambulatory Visit
Admission: RE | Admit: 2016-08-07 | Discharge: 2016-08-07 | Disposition: A | Discharge status: Home / Self Care | Payer: Medicare Other | Source: Ambulatory Visit | Attending: Hematology | Admitting: Hematology

## 2016-08-07 DIAGNOSIS — M7989 Other specified soft tissue disorders: Secondary | ICD-10-CM

## 2016-08-07 DIAGNOSIS — R928 Other abnormal and inconclusive findings on diagnostic imaging of breast: Secondary | ICD-10-CM | POA: Diagnosis not present

## 2016-08-12 ENCOUNTER — Ambulatory Visit (HOSPITAL_BASED_OUTPATIENT_CLINIC_OR_DEPARTMENT_OTHER): Payer: Medicare Other | Admitting: Hematology

## 2016-08-12 ENCOUNTER — Other Ambulatory Visit: Payer: Self-pay | Admitting: Pharmacist

## 2016-08-12 VITALS — BP 165/82 | HR 76 | Temp 98.4°F | Resp 18 | Ht 63.0 in | Wt 236.7 lb

## 2016-08-12 DIAGNOSIS — D72828 Other elevated white blood cell count: Secondary | ICD-10-CM | POA: Diagnosis not present

## 2016-08-12 DIAGNOSIS — N63 Unspecified lump in breast: Secondary | ICD-10-CM | POA: Diagnosis not present

## 2016-08-12 DIAGNOSIS — I2699 Other pulmonary embolism without acute cor pulmonale: Secondary | ICD-10-CM

## 2016-08-12 DIAGNOSIS — G4733 Obstructive sleep apnea (adult) (pediatric): Secondary | ICD-10-CM

## 2016-08-12 DIAGNOSIS — I739 Peripheral vascular disease, unspecified: Secondary | ICD-10-CM | POA: Diagnosis not present

## 2016-08-12 DIAGNOSIS — D649 Anemia, unspecified: Secondary | ICD-10-CM

## 2016-08-12 DIAGNOSIS — I82409 Acute embolism and thrombosis of unspecified deep veins of unspecified lower extremity: Secondary | ICD-10-CM | POA: Diagnosis not present

## 2016-08-12 DIAGNOSIS — N183 Chronic kidney disease, stage 3 (moderate): Secondary | ICD-10-CM

## 2016-08-12 DIAGNOSIS — D72829 Elevated white blood cell count, unspecified: Secondary | ICD-10-CM

## 2016-08-12 DIAGNOSIS — I509 Heart failure, unspecified: Secondary | ICD-10-CM | POA: Diagnosis not present

## 2016-08-12 DIAGNOSIS — I82412 Acute embolism and thrombosis of left femoral vein: Secondary | ICD-10-CM

## 2016-08-12 DIAGNOSIS — Z85038 Personal history of other malignant neoplasm of large intestine: Secondary | ICD-10-CM

## 2016-08-12 NOTE — Progress Notes (Signed)
[facility]  [phone] [phone]  Clinic follow up Note   Patient Care Team: [name] as PCP - General ([place]) Boyce Medici, FNP as Nurse Practitioner (Nurse Practitioner) Edrick Oh, MD as Consulting Physician (Nephrology) 08/12/2016  CHIEF COMPLAINTS:  Follow up anemia, history of thrombosis   HISTORY OF PRESENTING ILLNESS (08/14/2015):  Whitney Walker 60 y.o. female is referred here by her PCP Dr. Baird Cancer for mild leukocytosis.   She was previously seen by Dr. Jamse Arn in 2013 for leukocytosis. Her WBC has been around 11-18K since 08/2009, with predominantly neutrophils. BCR/ABL and check to mutation were checked in 2013 which were all negative. She also had a mild anemia in the past with hemoglobin in the 11-12 range, her platelet count has been normal.  She had PE in 10/2013 and was treated with Xaralto until 11/2014, she is now on aspirin. She has PVD and had left femoral popliteal vein bypass surgery in 07/2013. She also has COPD, she did use oxygen after she had PE, but not any more since Nov 2015 due to financial reasons. She has mild to moderate dyspenia on exertion.   She had tried quitting smoking a few times and finally stopped about 5 weeks ago.   She has had sinus infection on monthly bases, and recently caught a cold from her grand-grandson. She has headache for the past 4-5 month. She is being referred to see a neurologist.   She had early stage colon cancer in 2009, had surgical resection and no adjuvant chemo was needed. She had last colonoscopy in 05/2012  which was unremarkable. Her last mammogram was 2 years ago and she is planning to have a repeated one soon.   CURRENT THERAPY: Eliquis 5mg  daily    INTERIM HISTORY Whitney Walker returns for follow up. She finally underwent breast biopsy on 02/06/2016, which was negative. She has been taking Eliquis 5mg  daily and tolerating well. She was previously on Coumadin, but does  not want to check her Coumadin level periodically, and he was switched to Eliquis by her cardiologist. She is doing moderately well overall, has multiple complaints. She has occasional nausea, no vomiting, abdominal pain or change of her bowel habit. She has shortness breast on exertion, likely from her CHF. She has sleep apnea, using CPAP at night. She also has peripheral rescue disease, and complains of intermittent left leg pain. Her appetite and energy level are moderate, able to function at home.    MEDICAL HISTORY:  Past Medical History:  Diagnosis Date  . Anemia   . Anxiety   . Arthritis   . CHF (congestive heart failure) ([place])   . CKD (chronic kidney disease)    Stage III 03/2013  . Colon cancer ([place])   . COPD (chronic obstructive pulmonary disease) ([facility])   . Coronary artery disease   . Depression   . Hypertension   . Meningitis   . PE (pulmonary embolism)   . Peripheral vascular disease ([place])   . Shortness of breath   . Sleep apnea   . Vertigo     SURGICAL HISTORY: Past Surgical History:  Procedure Laterality Date  . ABDOMINAL AORTAGRAM N/A 07/26/2013   Procedure: ABDOMINAL Maxcine Ham;  Surgeon: [name] Rhame, MD;  Location: [facility] CATH LAB;  Service: Cardiovascular;  Laterality: N/A;  . ABDOMINAL HYSTERECTOMY    . CESAREAN SECTION     X 3 [id number]  . CHOLECYSTECTOMY    . CHOLECYSTECTOMY  1980/s  . COLECTOMY  2010  . DIVERTICULOSIS SURGERY-2002  2012  . FEMORAL-POPLITEAL BYPASS GRAFT Left 08/13/2013   Procedure: BYPASS GRAFT FEMORAL-POPLITEAL ARTERY WITH NON-REVERSED SAPHANEOUS VEIN; ULTRASOUND GUIDED;  Surgeon: Mal [name], MD;  Location: [facility];  Service: Vascular;  Laterality: Left;  . LOWER EXTREMITY ANGIOGRAM Left 07/26/2013   Procedure: LOWER EXTREMITY ANGIOGRAM;  Surgeon: [name] [place], MD;  Location: [facility] CATH LAB;  Service: Cardiovascular;  Laterality: Left;    SOCIAL HISTORY: Social History   Social History  . Marital status: Single    Spouse name: N/A  .  Number of children: 4  . Years of education: 12th gd   Occupational History  . N/A    Social History Main Topics  . Smoking status: Former Smoker    Packs/day: 1.50    Years: 40.00    Types: Cigarettes    Quit date: 08/09/2014  . Smokeless tobacco: Current User  . Alcohol use No     Comment: she used to drink alcohol, quit in 2010   . Drug use: No  . Sexual activity: Not on file   Other Topics Concern  . Not on file   Social History Narrative   2 cups of coffee a day     FAMILY HISTORY: Family History  Problem Relation Age of Onset  . Cancer Mother 51    breast and bone  . Cancer Father 27    prostate  . Cancer Cousin 20    breast cancer   . Hypertension Sister   . Bleeding Disorder Sister   . Hypertension Daughter     ALLERGIES:  is allergic to iohexol.  MEDICATIONS:  Current Outpatient Prescriptions  Medication Sig Dispense Refill  . albuterol (PROVENTIL HFA;VENTOLIN HFA) 108 (90 BASE) MCG/ACT inhaler Inhale 2 puffs into the lungs every 6 (six) hours as needed for wheezing or shortness of breath.    [name] amLODipine (NORVASC) 10 MG tablet Take 10 mg by mouth daily.    [name] Apixaban (ELIQUIS PO) Take by mouth.    . cetirizine (ZYRTEC) 10 MG tablet TK 1 T PO QD  3  . ELIQUIS 5 MG TABS tablet     . labetalol (NORMODYNE) 200 MG tablet      No current facility-administered medications for this visit.     REVIEW OF SYSTEMS:   Constitutional: Denies fevers, chills or abnormal night sweats Eyes: Denies blurriness of vision, double vision or watery eyes Ears, nose, mouth, throat, and face: Denies mucositis or sore throat Respiratory: Denies cough, dyspnea or wheezes Cardiovascular: Denies palpitation, chest discomfort or lower extremity swelling Gastrointestinal:  Denies nausea, heartburn or change in bowel habits Skin: Denies abnormal skin rashes Lymphatics: Denies new lymphadenopathy or easy bruising Neurological:Denies numbness, tingling or new  weaknesses Behavioral/Psych: Mood is stable, no new changes  All other systems were reviewed with the patient and are negative.  PHYSICAL EXAMINATION: ECOG PERFORMANCE STATUS: 2  Vitals:   08/12/16 1406  BP: (!) 165/82  Pulse: 76  Resp: 18  Temp: 98.4 F (36.9 C)   Filed Weights   08/12/16 1406  Weight: 236 lb 11.2 oz (107.4 kg)    GENERAL:alert, no distress and comfortable SKIN: skin color, texture, turgor are normal, no rashes or significant lesions EYES: normal, conjunctiva are pink and non-injected, sclera clear OROPHARYNX:no exudate, no erythema and lips, buccal mucosa, and tongue normal  NECK: supple, thyroid normal size, non-tender, without nodularity LYMPH:  no palpable lymphadenopathy in the cervical, axillary or inguinal LUNGS: clear to auscultation  and percussion with normal breathing effort HEART: regular rate & rhythm and no murmurs and no lower extremity edema ABDOMEN:abdomen soft, non-tender and normal bowel sounds Musculoskeletal:no cyanosis of digits and no clubbing  PSYCH: alert & oriented x 3 with fluent speech NEURO: no focal motor/sensory deficits Breast: pt declined breast exam today   LABORATORY DATA:  I have reviewed the data as listed CBC Latest Ref Rng & Units 08/05/2016 11/17/2015 10/25/2015  WBC 3.9 - 10.3 10e3/uL 12.0(H) 11.8(H) 11.1(H)  Hemoglobin 11.6 - 15.9 g/dL 10.9(L) 11.3(L) 11.6(L)  Hematocrit 34.8 - 46.6 % 35.0 36.9 36.9  Platelets 145 - 400 10e3/uL 233 199 320    CMP Latest Ref Rng & Units 08/05/2016 11/17/2015 10/25/2015  Glucose 70 - 140 mg/dl 106 106(H) 116(H)  BUN 7.0 - 26.0 mg/dL 40.6(H) 30(H) 48(H)  Creatinine 0.6 - 1.1 mg/dL 2.6(H) 2.23(H) 2.84(H)  Sodium 136 - 145 mEq/L 138 138 132(L)  Potassium 3.5 - 5.1 mEq/L 4.8 4.7 5.0  Chloride 101 - 111 mmol/L - 113(H) 110  CO2 22 - 29 mEq/L 15(L) 20(L) 14(L)  Calcium 8.4 - 10.4 mg/dL 10.1 9.3 9.5  Total Protein 6.4 - 8.3 g/dL 7.8 - -  Total Bilirubin 0.20 - 1.20 mg/dL <0.30 - -   Alkaline Phos 40 - 150 U/L 120 - -  AST 5 - 34 U/L 19 - -  ALT 0 - 55 U/L 24 - -   PATHOLOGY REPORT Diagnosis 02/06/2016 Breast, left, needle core biopsy, 11:00 o'clock - FAT NECROSIS WITH ASSOCIATED CALCIFICATION. - NO ATYPIA OR TUMOR SEEN.  RADIOGRAPHIC STUDIES: I have personally reviewed the radiological images as listed and agreed with the findings in the report. Mm Diag Breast Tomo Uni Left  Result Date: 08/07/2016 CLINICAL DATA:  Six month re-evaluation of benign concordant left breast ultrasound-guided core biopsy. Patient does have a small focal area of cellulitis with a boil on physical examination within the inferior left breast at the 6 o'clock position. EXAM: 2D DIGITAL DIAGNOSTIC UNILATERAL LEFT MAMMOGRAM WITH CAD AND ADJUNCT TOMO COMPARISON:  Previous exam(s). ACR Breast Density Category a: The breast tissue is almost entirely fatty. FINDINGS: The density previously biopsied within the medial left breast has resolved consistent with evolving fat necrosis. There is a clip related to the ultrasound core biopsy in the region of the previously seen density. There is mild increased density related to the patient's cellulitis and boil located at the 6 o'clock position of the left breast. There are no worrisome findings. Mammographic images were processed with CAD. IMPRESSION: No findings worrisome for malignancy. Evolving fat necrosis within the medial left breast. Recommend bilateral screening mammography in October, 2017. RECOMMENDATION: Bilateral screening mammography in October, 2017. I have discussed the findings and recommendations with the patient. Results were also provided in writing at the conclusion of the visit. If applicable, a reminder letter will be sent to the patient regarding the next appointment. BI-RADS CATEGORY  2: Benign. Electronically Signed   By: Altamese Cabal M.D.   On: 08/07/2016 14:47    ASSESSMENT & PLAN:  60 year old female with chronic mild leukocytosis and  anemia.  1. Left breast mass -this was discovered by screening mammogram in 08/2015, new from previous mammogram in 2013 -I reviewed her biopsy results, which showed fat necrosis with calcifications. No malignancy or atypical hyperplasia. -I encouraged her to have annual screening mammogram   2. Recurrent DVT and PE -She previously had episodes of PE, was treated with Xarelto. 10 months after she  came off Xarelto, she developed unprovoked DVT and PE. -I recommend her to continue anticoagulation indefinitely, if no contraindications. -Patient was previously on Coumadin, but switched to Eliquis due to her transportation issues with Coumadin check up. She has stage III CKD, I recommned her to have Eliquis 2.5mg  bid, I discussed the dosage with our pharmacist who concurred.   3. Mild leukocytosis, with predominant neutrophils -Her total white count today is 13.6 K, with neutrophil 9.5K, and the rest of the white cell within normal limits. Her mild leukocytosis has been stable over the past to 3 years, previous workup was negative for BCR/ABL or JAK 2 mutation. This is likely reactive, secondary to her smoking and chronic inflammations (allergy and sinus Infection). No evidence of myeloproliferative neoplasm.  -Follow-up CBC once a year  4. Mild normocytic anemia, likely secondary to her CKD -She had hemoglobin 11-12 in the past a few years, overall stable, likely related to her stage III chronic kidney disease  -. His iron study and SPEP were normal in 2013, we'll repeated next year  -Follow-up CBC once a year   5. History of colon cancer in 2009  -She has computed 5 years of surveillance.  -last colonoscopy in 05/2012, next due 2018 v  V   6. CKD, stage III -follow up with primary care physician  7. OSA, PVD, CHF -She will continue follow-up with her primary care physician and other specialists.  Plan -She will continue Eliquis 5mg /day, due to her CKD  -RTC in 4 months with lab   All  questions were answered. The patient knows to call the clinic with any problems, questions or concerns. I spent 20 minutes counseling the patient face to face. The total time spent in the appointment was 30 minutes and more than 50% was on counseling.     Truitt Merle, MD 08/12/2016

## 2016-08-13 ENCOUNTER — Other Ambulatory Visit: Payer: Self-pay | Admitting: Pharmacist

## 2016-08-14 ENCOUNTER — Other Ambulatory Visit: Payer: Self-pay | Admitting: Pharmacist

## 2016-08-15 ENCOUNTER — Telehealth: Payer: Self-pay | Admitting: Hematology

## 2016-08-15 NOTE — Telephone Encounter (Signed)
APPOINTMENT CONF WITH Pt. APPT SCHEDULE AND SCHD MAILED PER PT REQUEST.

## 2016-08-20 ENCOUNTER — Ambulatory Visit (HOSPITAL_COMMUNITY)
Admission: RE | Admit: 2016-08-20 | Discharge: 2016-08-20 | Disposition: A | Discharge status: Home / Self Care | Payer: Medicare Other | Source: Ambulatory Visit | Attending: Family | Admitting: Family

## 2016-08-20 ENCOUNTER — Ambulatory Visit (INDEPENDENT_AMBULATORY_CARE_PROVIDER_SITE_OTHER)
Admission: RE | Admit: 2016-08-20 | Discharge: 2016-08-20 | Disposition: A | Discharge status: Home / Self Care | Payer: Medicare Other | Source: Ambulatory Visit | Attending: Family | Admitting: Family

## 2016-08-20 ENCOUNTER — Ambulatory Visit: Payer: Medicare Other | Admitting: Family

## 2016-08-20 DIAGNOSIS — Z95828 Presence of other vascular implants and grafts: Secondary | ICD-10-CM

## 2016-08-20 DIAGNOSIS — F329 Major depressive disorder, single episode, unspecified: Secondary | ICD-10-CM | POA: Diagnosis not present

## 2016-08-20 DIAGNOSIS — F419 Anxiety disorder, unspecified: Secondary | ICD-10-CM | POA: Insufficient documentation

## 2016-08-20 DIAGNOSIS — J449 Chronic obstructive pulmonary disease, unspecified: Secondary | ICD-10-CM | POA: Diagnosis not present

## 2016-08-20 DIAGNOSIS — N189 Chronic kidney disease, unspecified: Secondary | ICD-10-CM | POA: Insufficient documentation

## 2016-08-20 DIAGNOSIS — I130 Hypertensive heart and chronic kidney disease with heart failure and stage 1 through stage 4 chronic kidney disease, or unspecified chronic kidney disease: Secondary | ICD-10-CM | POA: Insufficient documentation

## 2016-08-20 DIAGNOSIS — Z789 Other specified health status: Secondary | ICD-10-CM

## 2016-08-20 DIAGNOSIS — Z87891 Personal history of nicotine dependence: Secondary | ICD-10-CM | POA: Diagnosis not present

## 2016-08-20 DIAGNOSIS — Z720 Tobacco use: Secondary | ICD-10-CM

## 2016-08-20 DIAGNOSIS — I2510 Atherosclerotic heart disease of native coronary artery without angina pectoris: Secondary | ICD-10-CM | POA: Diagnosis not present

## 2016-08-20 DIAGNOSIS — G4730 Sleep apnea, unspecified: Secondary | ICD-10-CM | POA: Diagnosis not present

## 2016-08-20 DIAGNOSIS — I509 Heart failure, unspecified: Secondary | ICD-10-CM | POA: Insufficient documentation

## 2016-08-20 DIAGNOSIS — R0989 Other specified symptoms and signs involving the circulatory and respiratory systems: Secondary | ICD-10-CM | POA: Diagnosis present

## 2016-08-25 ENCOUNTER — Encounter: Payer: Self-pay | Admitting: Hematology

## 2016-08-27 ENCOUNTER — Encounter: Payer: Self-pay | Admitting: Family

## 2016-08-27 DIAGNOSIS — G4733 Obstructive sleep apnea (adult) (pediatric): Secondary | ICD-10-CM | POA: Diagnosis not present

## 2016-08-29 ENCOUNTER — Ambulatory Visit (INDEPENDENT_AMBULATORY_CARE_PROVIDER_SITE_OTHER): Payer: Medicare Other | Admitting: Family

## 2016-08-29 ENCOUNTER — Encounter: Payer: Self-pay | Admitting: Family

## 2016-08-29 VITALS — BP 150/90 | HR 70 | Temp 98.4°F | Resp 20 | Ht 63.0 in | Wt 235.0 lb

## 2016-08-29 DIAGNOSIS — I779 Disorder of arteries and arterioles, unspecified: Secondary | ICD-10-CM | POA: Diagnosis not present

## 2016-08-29 DIAGNOSIS — Z7722 Contact with and (suspected) exposure to environmental tobacco smoke (acute) (chronic): Secondary | ICD-10-CM

## 2016-08-29 DIAGNOSIS — I82502 Chronic embolism and thrombosis of unspecified deep veins of left lower extremity: Secondary | ICD-10-CM

## 2016-08-29 DIAGNOSIS — Z95828 Presence of other vascular implants and grafts: Secondary | ICD-10-CM | POA: Diagnosis not present

## 2016-08-29 DIAGNOSIS — Z86711 Personal history of pulmonary embolism: Secondary | ICD-10-CM | POA: Diagnosis not present

## 2016-08-29 NOTE — Progress Notes (Signed)
[facility]   CC: Follow up peripheral artery occlusive disease  History of Present Illness Whitney Walker is a 60 y.o. female patient of Dr. Kellie Simmering returns for continued followup regarding her left femoral popliteal vein graft performed August 13, 2013. She had ischemia of the left fourth and fifth toes which has healed since her bypass.  She did have a pulmonary embolus November 24, 2013 and started Jennye Moccasin but this has been stopped by her cardiologist as pt states this was causing her headaches.  She is now on Eliquis for PE diagnosed in October 2016; also noted that she has a chronic DVT in the left popliteal vein.   Pt states she started noticing left fingers numbness and burning about April 2015, noticed left leg weakness and giving way at times since about April 2015. Burning sensation on outer aspect left foot since early 2015, aggravated by walking. Pt reports she is losing some of her memory. Pt states she saw a neurologist, Dr. Star Age for evaluation of sleep apnea, headaches, and memory loss.  Pt states she did not discuss with Dr. Dia Sitter her left fingers numbness and burning and left leg weakness. Pt has been on bipap at night for the last 4 days which had resolved her headaches, but headaches have resumed; pt states she told her neurologist this.  Pt denies any history of stroke. But she reports left side weakness, left side of mouth drooling for months that came on gradually; she saw her neurologist August 7 or 9, 2017, but states she did not discuss this with her neurologist. She states she has another appointment with her neurologist in a few months.   She does report left side weakness since about April 2015.   She sees an oncologist and a nephrologist.   Pt Diabetic: No  Pt smoker: former smoker (is using vapor electronic cigarette with no nicotine, states she is also exposed to second hand smoke)   Pt meds include:  Statin :No,  she stopped on medical advice as headache may be caused by this  ASA: Yes Other anticoagulants/antiplatelets: Xarelto was stopped by Dr. Einar Gip about November 2015, she was taking this for hx of PE, pt states she was then advised to take a daily 325 mg ASA, no longer has headaches since she stopped Xarelto. She is now on Eliquis for PE diagnosed in October 2016; also noted that she has a chronic DVT in the left popliteal vein.     Past Medical History:  Diagnosis Date  . Anemia   . Anxiety   . Arthritis   . CHF (congestive heart failure) ([place])   . CKD (chronic kidney disease)    Stage III 03/2013  . Colon cancer ([place])   . COPD (chronic obstructive pulmonary disease) ([place])   . Coronary artery disease   . Depression   . Hypertension   . Meningitis   . PE (pulmonary embolism)   . Peripheral vascular disease ([place])   . Shortness of breath   . Sleep apnea   . Vertigo     Social History Social History  Substance Use Topics  . Smoking status: Former Smoker    Packs/day: 1.50    Years: 40.00    Types: Cigarettes    Quit date: 08/09/2014  . Smokeless tobacco: Current User  . Alcohol use No     Comment: she used to drink alcohol, quit in 2010     Family History Family History  Problem  Relation Age of Onset  . Cancer Mother 13    breast and bone  . Cancer Father 12    prostate  . Cancer Cousin 20    breast cancer   . Hypertension Sister   . Bleeding Disorder Sister   . Hypertension Daughter     Past Surgical History:  Procedure Laterality Date  . ABDOMINAL AORTAGRAM N/A 07/26/2013   Procedure: ABDOMINAL Maxcine Ham;  Surgeon: [name] Moriches, MD;  Location: [facility] CATH LAB;  Service: Cardiovascular;  Laterality: N/A;  . ABDOMINAL HYSTERECTOMY    . CESAREAN SECTION     X 3 [id number]  . CHOLECYSTECTOMY    . CHOLECYSTECTOMY  1980/s  . COLECTOMY  2010  . DIVERTICULOSIS SURGERY-2002  2012  . FEMORAL-POPLITEAL BYPASS GRAFT Left 08/13/2013   Procedure: BYPASS GRAFT  FEMORAL-POPLITEAL ARTERY WITH NON-REVERSED SAPHANEOUS VEIN; ULTRASOUND GUIDED;  Surgeon: Mal [name], MD;  Location: [facility];  Service: Vascular;  Laterality: Left;  . LOWER EXTREMITY ANGIOGRAM Left 07/26/2013   Procedure: LOWER EXTREMITY ANGIOGRAM;  Surgeon: [name] [place], MD;  Location: [facility] CATH LAB;  Service: Cardiovascular;  Laterality: Left;    Allergies  Allergen Reactions  . Iohexol Other (See Comments)     Code: HIVES, Desc: HIVES W/ "DYE" USED FOR 1ST CT SCAN BUT NOT 2ND, NO PREMEDS USED, PT UNCERTAIN OF CIRCUMSTANCES,,?POSSIBLE MRI CONTRAST ALLERGY, ALL STUDIES DONE "SOMEWHERE" IN [place]//A.C., Onset Date: [id number]     Current Outpatient Prescriptions  Medication Sig Dispense Refill  . albuterol (PROVENTIL HFA;VENTOLIN HFA) 108 (90 BASE) MCG/ACT inhaler Inhale 2 puffs into the lungs every 6 (six) hours as needed for wheezing or shortness of breath.    [name] amLODipine (NORVASC) 10 MG tablet Take 10 mg by mouth daily.    . cetirizine (ZYRTEC) 10 MG tablet TK 1 T PO QD  3  . ELIQUIS 5 MG TABS tablet     . labetalol (NORMODYNE) 200 MG tablet      No current facility-administered medications for this visit.     ROS: See HPI for pertinent positives and negatives.   Physical Examination  Vitals:   08/29/16 1327 08/29/16 1335  BP: 140/80 (!) 150/90  Pulse: 70   Resp: 20   Temp: 98.4 F (36.9 C)   TempSrc: Oral   SpO2: 96%   Weight: 235 lb (106.6 kg)   Height: 5\' 3"  (1.6 m)    Body mass index is 41.63 kg/m.  General: A&O x 3, WDWN, morbidly obese female.  Gait: normal  Eyes: PERRLA.  Pulmonary: Respirations are non labored, CTAB, without wheezes , rales or rhonchi.  Cardiac: regular rhythm, no detected murmur.   Carotid Bruits  Left  Right    Negative  Negative    Aorta is not palpable.  Radial pulses: are 1 + palpable and =   VASCULAR EXAM:  Extremities without ischemic changes  without Gangrene; without open wounds.   LE Pulses   LEFT  RIGHT   POPLITEAL  not palpable  not palpable   POSTERIOR TIBIAL  Not palpable  Not palpable   DORSALIS PEDIS  ANTERIOR TIBIAL  2+palpable  2+palpable    Abdomen: soft, NT, no palpable masses.  Skin: no rashes, no ulcers.  Musculoskeletal: no muscle wasting or atrophy.  Neurologic: A&O X 3; Appropriate Affect ; SENSATION: normal except for allodynia in left calf; MOTOR FUNCTION: moving all extremities equally, motor strength 5/5 in arms, 4/5 in legs. Speech is fluent/normal. CN 2-12 intact.    MRI  of brain on 05/08/15 requested by Dr.Athar: Abnormal MRI of the brain without contrast showing white matter changes most compatible with small vessel ischemic changes. The extent is more than expected for age.   There are no acute findings.    ASSESSMENT: Whitney Walker is a 60 y.o. female who is s/p left femoral popliteal vein graft performed August 13, 2013. She had ischemia of the left fourth and fifth toes which has healed since her bypass.  She does not have claudication symptoms, no signs of ischemia in her feet/legs.  DATA 08/20/16 ABI's remain normal with all triphasic waveforms, TBI's remain normal. Left leg arterial bypass graft with no evidence of restenosis. Chronic DVT noted in the left popliteal vein, consistent with patient's medical history. No significant change from the last exam on 08/16/15.   The left side weakness she is having is not due to arterial insufficiency.   She reports left side weakness including arm and leg, left side of mouth drooling, for months that came on gradually; she saw her neurologist August 7 or 9, 2017, but states she did not discuss this with her neurologist. She states she has another appointment with her neurologist in a few months.    PLAN:  Based on the patient's vascular studies and examination, pt will return to clinic in 1 year with ABI's and left LE arterial duplex.   I discussed in depth with the  patient the nature of atherosclerosis, and emphasized the importance of maximal medical management including strict control of blood pressure, blood glucose, and lipid levels, obtaining regular exercise, and continued cessation of smoking.  The patient is aware that without maximal medical management the underlying atherosclerotic disease process will progress, limiting the benefit of any interventions.  The patient was given information about PAD including signs, symptoms, treatment, what symptoms should prompt the patient to seek immediate medical care, and risk reduction measures to take.  Clemon Chambers, RN, MSN, FNP-C [facility] Phone: [phone]  Clinic MD: One[place] Alar  08/29/16 1:39 PM

## 2016-08-29 NOTE — Patient Instructions (Signed)
Peripheral Vascular Disease Peripheral vascular disease (PVD) is a disease of the blood vessels that are not part of your heart and brain. A simple term for PVD is poor circulation. In most cases, PVD narrows the blood vessels that carry blood from your heart to the rest of your body. This can result in a decreased supply of blood to your arms, legs, and internal organs, like your stomach or kidneys. However, it most often affects a person's lower legs and feet. There are two types of PVD.  Organic PVD. This is the more common type. It is caused by damage to the structure of blood vessels.  Functional PVD. This is caused by conditions that make blood vessels contract and tighten (spasm). Without treatment, PVD tends to get worse over time. PVD can also lead to acute ischemic limb. This is when an arm or limb suddenly has trouble getting enough blood. This is a medical emergency. CAUSES Each type of PVD has many different causes. The most common cause of PVD is buildup of a fatty material (plaque) inside of your arteries (atherosclerosis). Small amounts of plaque can break off from the walls of the blood vessels and become lodged in a smaller artery. This blocks blood flow and can cause acute ischemic limb. Other common causes of PVD include:  Blood clots that form inside of blood vessels.  Injuries to blood vessels.  Diseases that cause inflammation of blood vessels or cause blood vessel spasms.  Health behaviors and health history that increase your risk of developing PVD. RISK FACTORS  You may have a greater risk of PVD if you:  Have a family history of PVD.  Have certain medical conditions, including:  High cholesterol.  Diabetes.  High blood pressure (hypertension).  Coronary heart disease.  Past problems with blood clots.  Past injury, such as burns or a broken bone. These may have damaged blood vessels in your limbs.  Buerger disease. This is caused by inflamed blood  vessels in your hands and feet.  Some forms of arthritis.  Rare birth defects that affect the arteries in your legs.  Use tobacco.  Do not get enough exercise.  Are obese.  Are age 50 or older. SIGNS AND SYMPTOMS  PVD may cause many different symptoms. Your symptoms depend on what part of your body is not getting enough blood. Some common signs and symptoms include:  Cramps in your lower legs. This may be a symptom of poor leg circulation (claudication).  Pain and weakness in your legs while you are physically active that goes away when you rest (intermittent claudication).  Leg pain when at rest.  Leg numbness, tingling, or weakness.  Coldness in a leg or foot, especially when compared with the other leg.  Skin or hair changes. These can include:  Hair loss.  Shiny skin.  Pale or bluish skin.  Thick toenails.  Inability to get or maintain an erection (erectile dysfunction). People with PVD are more prone to developing ulcers and sores on their toes, feet, or legs. These may take longer than normal to heal. DIAGNOSIS Your health care provider may diagnose PVD from your signs and symptoms. The health care provider will also do a physical exam. You may have tests to find out what is causing your PVD and determine its severity. Tests may include:  Blood pressure recordings from your arms and legs and measurements of the strength of your pulses (pulse volume recordings).  Imaging studies using sound waves to take pictures of   the blood flow through your blood vessels (Doppler ultrasound).  Injecting a dye into your blood vessels before having imaging studies using:  X-rays (angiogram or arteriogram).  Computer-generated X-rays (CT angiogram).  A powerful electromagnetic field and a computer (magnetic resonance angiogram or MRA). TREATMENT Treatment for PVD depends on the cause of your condition and the severity of your symptoms. It also depends on your age. Underlying  causes need to be treated and controlled. These include long-lasting (chronic) conditions, such as diabetes, high cholesterol, and high blood pressure. You may need to first try making lifestyle changes and taking medicines. Surgery may be needed if these do not work. Lifestyle changes may include:  Quitting smoking.  Exercising regularly.  Following a low-fat, low-cholesterol diet. Medicines may include:  Blood thinners to prevent blood clots.  Medicines to improve blood flow.  Medicines to improve your blood cholesterol levels. Surgical procedures may include:  A procedure that uses an inflated balloon to open a blocked artery and improve blood flow (angioplasty).  A procedure to put in a tube (stent) to keep a blocked artery open (stent implant).  Surgery to reroute blood flow around a blocked artery (peripheral bypass surgery).  Surgery to remove dead tissue from an infected wound on the affected limb.  Amputation. This is surgical removal of the affected limb. This may be necessary in cases of acute ischemic limb that are not improved through medical or surgical treatments. HOME CARE INSTRUCTIONS  Take medicines only as directed by your health care provider.  Do not use any tobacco products, including cigarettes, chewing tobacco, or electronic cigarettes. If you need help quitting, ask your health care provider.  Lose weight if you are overweight, and maintain a healthy weight as directed by your health care provider.  Eat a diet that is low in fat and cholesterol. If you need help, ask your health care provider.  Exercise regularly. Ask your health care provider to suggest some good activities for you.  Use compression stockings or other mechanical devices as directed by your health care provider.  Take good care of your feet.  Wear comfortable shoes that fit well.  Check your feet often for any cuts or sores. SEEK MEDICAL CARE IF:  You have cramps in your legs  while walking.  You have leg pain when you are at rest.  You have coldness in a leg or foot.  Your skin changes.  You have erectile dysfunction.  You have cuts or sores on your feet that are not healing. SEEK IMMEDIATE MEDICAL CARE IF:  Your arm or leg turns cold and blue.  Your arms or legs become red, warm, swollen, painful, or numb.  You have chest pain or trouble breathing.  You suddenly have weakness in your face, arm, or leg.  You become very confused or lose the ability to speak.  You suddenly have a very bad headache or lose your vision.   This information is not intended to replace advice given to you by your health care provider. Make sure you discuss any questions you have with your health care provider.   Document Released: 01/23/2005 Document Revised: 01/06/2015 Document Reviewed: 05/26/2014 Elsevier Interactive Patient Education 2016 Elsevier Inc.       Secondhand Smoke WHAT IS SECONDHAND SMOKE? Secondhand smoke is smoke that comes from burning tobacco. It could be the smoke from a cigarette, a pipe, or a cigar. Even if you are not the one smoking, secondhand smoke exposes you to the dangers of   smoking. This is called involuntary, or passive, smoking. There are two types of secondhand smoke:  Sidestream smoke is the smoke that comes off the lighted end of a cigarette, pipe, or cigar.  This type of smoke has the highest amount of cancer-causing agents (carcinogens).  The particles in sidestream smoke are smaller. They get into your lungs more easily.  Mainstream smoke is the smoke that is exhaled by a person who is smoking.  This type of smoke is also dangerous to your health. HOW CAN SECONDHAND SMOKE AFFECT MY HEALTH? Studies show that there is no safe level of secondhand smoke. This smoke contains thousands of chemicals. At least 69 of them are known to cause cancer. Secondhand smoke can also cause many other health problems. It has been linked  to:  Lung cancer.  Cancer of the voice box (larynx) or throat.  Cancer of the sinuses.  Brain cancer.  Bladder cancer.  Stomach cancer.  Breast cancer.  White blood cell cancers (lymphoma and leukemia).  Brain and liver tumors in children.  Heart disease and stroke in adults.  Pregnancy loss (miscarriage).  Diseases in children, such as:  Asthma.  Lung infections.  Ear infections.  Sudden infant death syndrome (SIDS).  Slow growth. WHERE CAN I BE AT RISK FOR EXPOSURE TO SECONDHAND SMOKE?   For adults, the workplace is the main source of exposure to secondhand smoke.  Your workplace should have a policy separating smoking areas from nonsmoking areas.  Smoking areas should have a system for ventilating and cleaning the air.  For children, the home may be the most dangerous place for exposure to secondhand smoke.  Children who live in apartment buildings may be at risk from smoke drifting from hallways or other people's homes.  For everyone, many public places are possible sources of exposure to secondhand smoke.  These places include restaurants, shopping centers, and parks. HOW CAN I REDUCE MY RISK FOR EXPOSURE TO SECONDHAND SMOKE? The most important thing you can do is not smoke. Discourage family members from smoking. Other ways to reduce exposure for you and your family include the following:  Keep your home smoke free.  Make sure your child care providers do not smoke.  Warn your child about the dangers of smoking and secondhand smoke.  Do not allow smoking in your car. When someone smokes in a car, all the damaging chemicals from the smoke are confined in a small area.  Avoid public places where smoking is allowed.   This information is not intended to replace advice given to you by your health care provider. Make sure you discuss any questions you have with your health care provider.   Document Released: 01/23/2005 Document Revised: 01/06/2015  Document Reviewed: 04/01/2014 Elsevier Interactive Patient Education 2016 Elsevier Inc.  

## 2016-08-30 NOTE — Addendum Note (Signed)
Addended by: Reola Calkins on: 08/30/2016 03:33 PM   Modules accepted: Orders

## 2016-09-03 ENCOUNTER — Other Ambulatory Visit: Payer: Self-pay | Admitting: Internal Medicine

## 2016-09-03 DIAGNOSIS — Z1231 Encounter for screening mammogram for malignant neoplasm of breast: Secondary | ICD-10-CM

## 2016-09-25 DIAGNOSIS — E785 Hyperlipidemia, unspecified: Secondary | ICD-10-CM | POA: Diagnosis not present

## 2016-09-25 DIAGNOSIS — Z720 Tobacco use: Secondary | ICD-10-CM | POA: Diagnosis not present

## 2016-09-25 DIAGNOSIS — D631 Anemia in chronic kidney disease: Secondary | ICD-10-CM | POA: Diagnosis not present

## 2016-09-25 DIAGNOSIS — N183 Chronic kidney disease, stage 3 (moderate): Secondary | ICD-10-CM | POA: Diagnosis not present

## 2016-09-25 DIAGNOSIS — E669 Obesity, unspecified: Secondary | ICD-10-CM | POA: Diagnosis not present

## 2016-10-02 DIAGNOSIS — R079 Chest pain, unspecified: Secondary | ICD-10-CM | POA: Diagnosis not present

## 2016-10-02 DIAGNOSIS — J449 Chronic obstructive pulmonary disease, unspecified: Secondary | ICD-10-CM | POA: Diagnosis not present

## 2016-10-02 DIAGNOSIS — I129 Hypertensive chronic kidney disease with stage 1 through stage 4 chronic kidney disease, or unspecified chronic kidney disease: Secondary | ICD-10-CM | POA: Diagnosis not present

## 2016-10-02 DIAGNOSIS — N183 Chronic kidney disease, stage 3 (moderate): Secondary | ICD-10-CM | POA: Diagnosis not present

## 2016-10-03 ENCOUNTER — Other Ambulatory Visit: Payer: Self-pay | Admitting: Nurse Practitioner

## 2016-10-03 ENCOUNTER — Ambulatory Visit
Admission: RE | Admit: 2016-10-03 | Discharge: 2016-10-03 | Disposition: A | Discharge status: Home / Self Care | Payer: Medicare Other | Source: Ambulatory Visit | Attending: Nurse Practitioner | Admitting: Nurse Practitioner

## 2016-10-03 DIAGNOSIS — R05 Cough: Secondary | ICD-10-CM

## 2016-10-03 DIAGNOSIS — R059 Cough, unspecified: Secondary | ICD-10-CM

## 2016-10-03 DIAGNOSIS — R0789 Other chest pain: Secondary | ICD-10-CM

## 2016-10-03 DIAGNOSIS — R079 Chest pain, unspecified: Secondary | ICD-10-CM | POA: Diagnosis not present

## 2016-10-03 DIAGNOSIS — R0602 Shortness of breath: Secondary | ICD-10-CM | POA: Diagnosis not present

## 2016-10-03 IMAGING — CR DG CHEST 2V
2 series · 2 of 2 positions shown · non-contrast
Comparison: [DATE]

CLINICAL DATA: Cough, increased shortness of breath and LEFT
anterior chest pain for 3 months, former smoker, history COPD,
hypertension, colon cancer, CHF, pulmonary embolism, coronary artery
disease

EXAM:
CHEST  2 VIEW

[w chest pa]
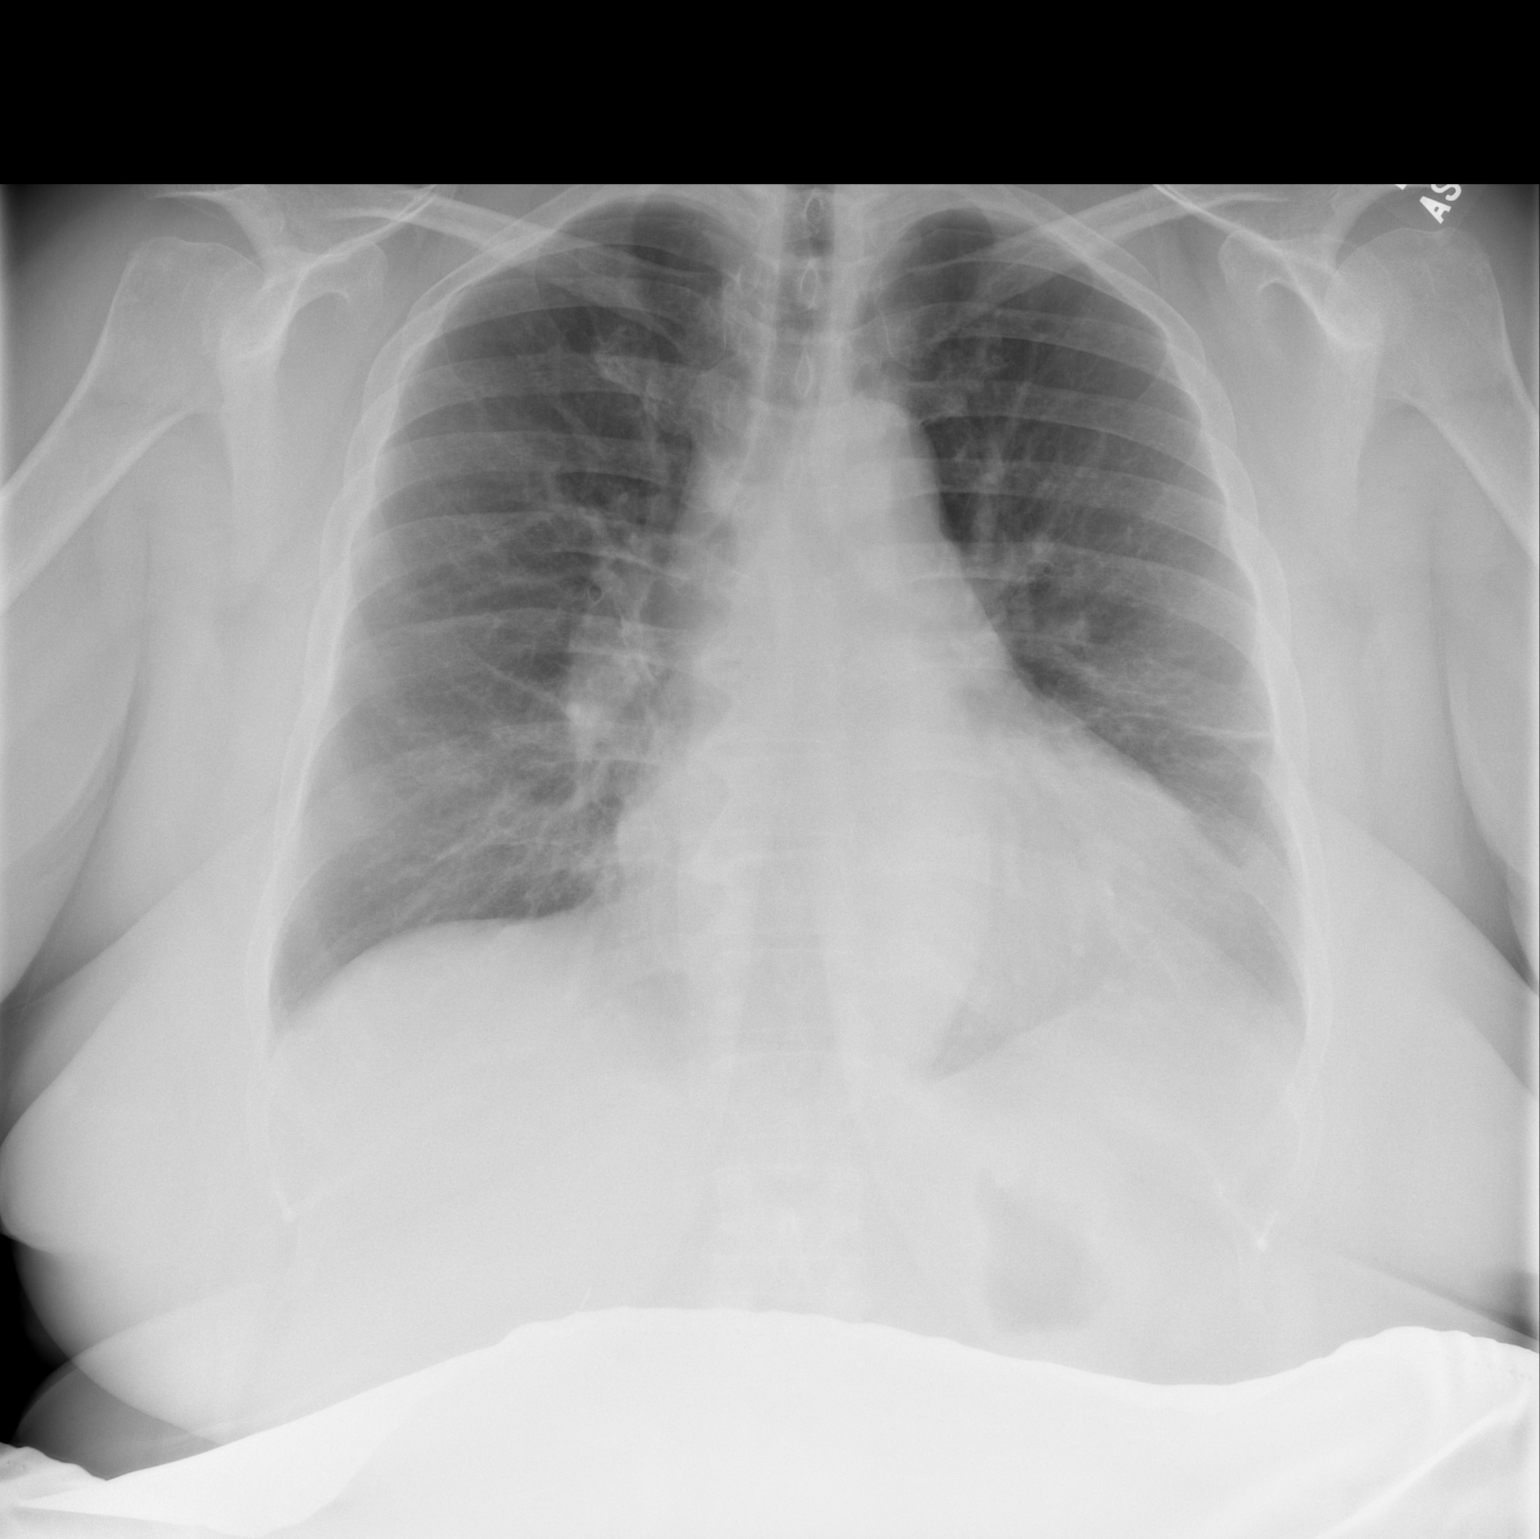

[w chest lat *]
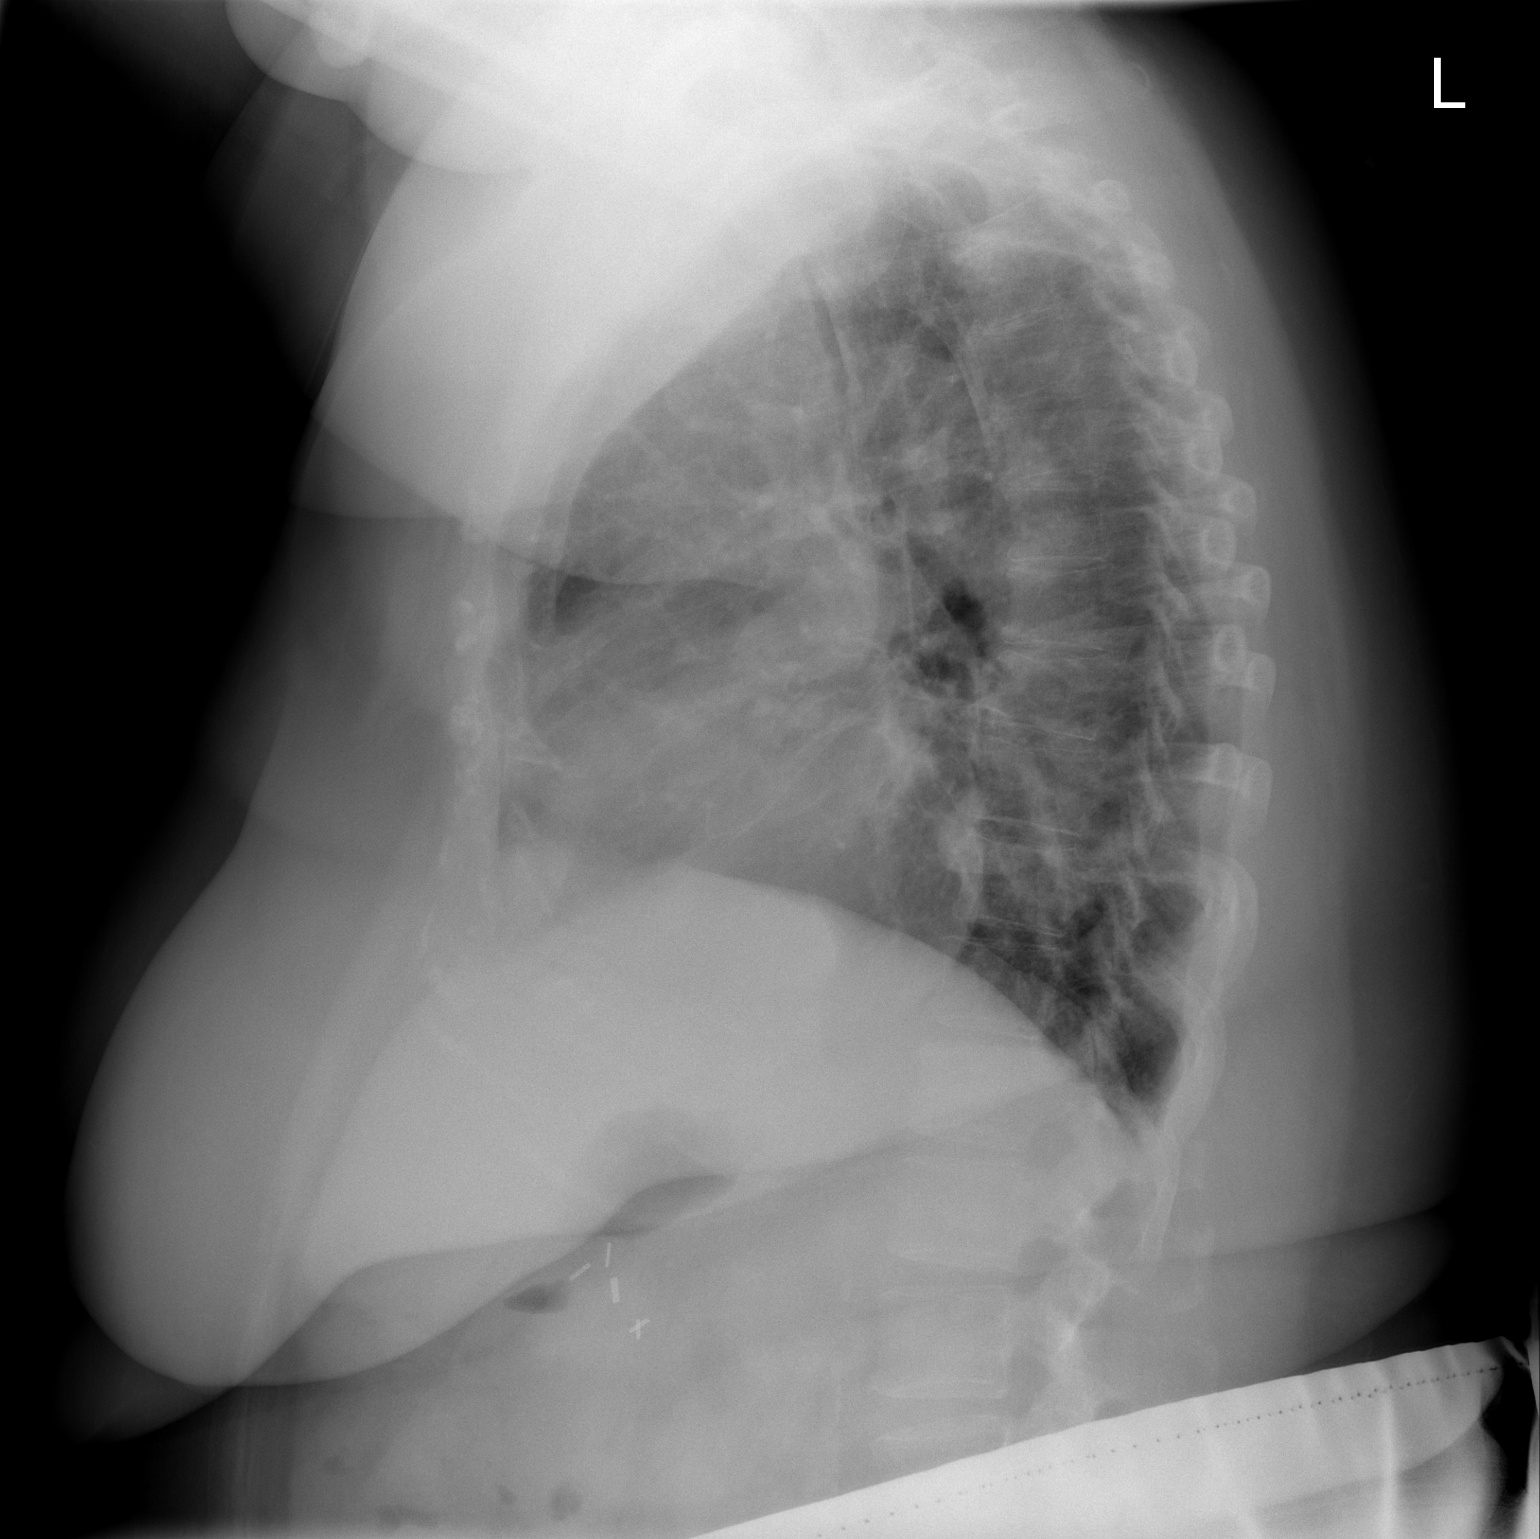

[2 of 2 positions shown; findings below may reference images not displayed]

FINDINGS: Enlargement of cardiac silhouette with pulmonary vascular
congestion.

Tortuosity of thoracic aorta.

Minimal atelectasis or scarring at lingula.

Lungs otherwise clear.

Mild chronic central peribronchial thickening.

No infiltrate, pleural effusion or pneumothorax.

Bones unremarkable.
IMPRESSION: Enlargement of cardiac silhouette.

Bronchitic changes with minimal atelectasis or scarring in lingula.

## 2016-10-07 ENCOUNTER — Ambulatory Visit
Admission: RE | Admit: 2016-10-07 | Discharge: 2016-10-07 | Disposition: A | Discharge status: Home / Self Care | Payer: Medicare Other | Source: Ambulatory Visit | Attending: Internal Medicine | Admitting: Internal Medicine

## 2016-10-07 DIAGNOSIS — Z1231 Encounter for screening mammogram for malignant neoplasm of breast: Secondary | ICD-10-CM

## 2016-12-10 ENCOUNTER — Encounter: Payer: Self-pay | Admitting: Hematology

## 2016-12-10 ENCOUNTER — Other Ambulatory Visit: Payer: Self-pay

## 2016-12-10 ENCOUNTER — Telehealth: Payer: Self-pay | Admitting: *Deleted

## 2016-12-10 DIAGNOSIS — D631 Anemia in chronic kidney disease: Secondary | ICD-10-CM | POA: Insufficient documentation

## 2016-12-10 DIAGNOSIS — N183 Chronic kidney disease, stage 3 unspecified: Secondary | ICD-10-CM | POA: Insufficient documentation

## 2016-12-10 NOTE — Telephone Encounter (Signed)
Pt has been calling and leaving several messages for scheduling with no return call.  I rescheduled and confirmed pt for 12/20/16 appt.

## 2016-12-16 ENCOUNTER — Telehealth: Payer: Self-pay | Admitting: *Deleted

## 2016-12-16 NOTE — Telephone Encounter (Signed)
Spoke with pt and gave pt appts for lab 0930 am, office visit with Dr. Burr Medico 10 am on Friday 01/03/17.  Pt voiced understanding.  Schedule message sent.

## 2016-12-20 ENCOUNTER — Ambulatory Visit: Payer: Self-pay | Admitting: Hematology

## 2016-12-20 ENCOUNTER — Other Ambulatory Visit: Payer: Self-pay

## 2016-12-25 NOTE — Progress Notes (Signed)
[facility]  Telephone:(336) [phone] Fax:(336) [phone]  Clinic follow up Note   Patient Care Team: [name] as PCP - General ([place]) Boyce Medici, FNP as Nurse Practitioner (Nurse Practitioner) Edrick Oh, MD as Consulting Physician (Nephrology) [name], MD as Consulting Physician (Cardiology) Star Age, MD as Attending Physician (Neurology) Truitt Merle, MD as Consulting Physician (Hematology) 01/03/2017  CHIEF COMPLAINTS:  Follow up anemia, history of thrombosis   HISTORY OF PRESENTING ILLNESS (08/14/2015):  Whitney Walker 60 y.o. female is referred here by her PCP Dr. Baird Cancer for mild leukocytosis.   She was previously seen by Dr. Jamse Arn in 2013 for leukocytosis. Her WBC has been around 11-18K since 08/2009, with predominantly neutrophils. BCR/ABL and check to mutation were checked in 2013 which were all negative. She also had a mild anemia in the past with hemoglobin in the 11-12 range, her platelet count has been normal.  She had PE in 10/2013 and was treated with Xaralto until 11/2014, she is now on aspirin. She has PVD and had left femoral popliteal vein bypass surgery in 07/2013. She also has COPD, she did use oxygen after she had PE, but not any more since Nov 2015 due to financial reasons. She has mild to moderate dyspenia on exertion.   She had tried quitting smoking a few times and finally stopped about 5 weeks ago.   She has had sinus infection on monthly bases, and recently caught a cold from her grand-grandson. She has headache for the past 4-5 month. She is being referred to see a neurologist.   She had early stage colon cancer in 2009, had surgical resection and no adjuvant chemo was needed. She had last colonoscopy in 05/2012  which was unremarkable. Her last mammogram was 2 years ago and she is planning to have a repeated one soon.   CURRENT THERAPY: Eliquis 5mg  BID, will change to 2.5mg  bid due to her worsening CKD   INTERIM  HISTORY Whitney Walker returns for follow up. She was previously on Coumadin, but does not want to check her Coumadin level periodically, and he was switched to Eliquis by her cardiologist.  The patient has been taking Eliquis 5 mg BID instead of 2.5 mg BID. She is doing moderately well overall. Reports she's doing ok. She reports depression, CHF, and sleeps with a CPAP. Reports breathing difficulties that have become somewhat worse since the last visit. She is SOB at all times. She reports being prescribed new medication by her PCP yesterday. Sharp chest pains that comes and goes that started Tuesday night. She had a sharp pain in her left arm yesterday, but no pain at this time. She reports feeling cold all of the time. Reports nose bleeds when she blows her nose at times. Denies hematochezia, hematuria, or hemoptysis. Her appetite and energy level are moderate, able to function at home. Reports her last blood clot was in October 2016, but is not sure. States she has 2 in the lungs and 1 in her left leg. The patient states her PCP noted decreased breath sounds in her left lung and wants to refer her to a pulmonologist. The patient reports vertigo that causes her to take baths instead of showers. Denies syncope or falls. Reports "heart flutters once in a blue moon." States she stopped taking her BP medication for about a week when she had hand and leg spasms, but started once again when she started to have headaches and chest pain. Reports some bilateral lower extremity swelling,  but more on the left.  MEDICAL HISTORY:  Past Medical History:  Diagnosis Date  . Anemia   . Anxiety   . Arthritis   . CHF (congestive heart failure) ([place])   . CKD (chronic kidney disease)    Stage III 03/2013  . Colon cancer ([place])   . COPD (chronic obstructive pulmonary disease) ([place])   . Coronary artery disease   . Depression   . Hypertension   . Meningitis   . PE (pulmonary embolism)   . Peripheral vascular disease ([place])    . Shortness of breath   . Sleep apnea   . Vertigo     SURGICAL HISTORY: Past Surgical History:  Procedure Laterality Date  . ABDOMINAL AORTAGRAM N/A 07/26/2013   Procedure: ABDOMINAL Maxcine Ham;  Surgeon: [name] [place], MD;  Location: [facility] CATH LAB;  Service: Cardiovascular;  Laterality: N/A;  . ABDOMINAL HYSTERECTOMY    . CESAREAN SECTION     X 3 [id number]  . CHOLECYSTECTOMY    . CHOLECYSTECTOMY  1980/s  . COLECTOMY  2010  . DIVERTICULOSIS SURGERY-2002  2012  . FEMORAL-POPLITEAL BYPASS GRAFT Left 08/13/2013   Procedure: BYPASS GRAFT FEMORAL-POPLITEAL ARTERY WITH NON-REVERSED SAPHANEOUS VEIN; ULTRASOUND GUIDED;  Surgeon: Mal [name], MD;  Location: [facility];  Service: Vascular;  Laterality: Left;  . LOWER EXTREMITY ANGIOGRAM Left 07/26/2013   Procedure: LOWER EXTREMITY ANGIOGRAM;  Surgeon: [name] Andrews, MD;  Location[facility] CATH LAB;  Service: Cardiovascular;  Laterality: Left;    SOCIAL HISTORY: Social History   Social History  . Marital status: Single    Spouse name: N/A  . Number of children: 4  . Years of education: 12th gd   Occupational History  . N/A    Social History Main Topics  . Smoking status: Former Smoker    Packs/day: 1.50    Years: 40.00    Types: Cigarettes    Quit date: 08/09/2014  . Smokeless tobacco: Current User  . Alcohol use No     Comment: she used to drink alcohol, quit in 2010   . Drug use: No  . Sexual activity: Not on file   Other Topics Concern  . Not on file   Social History Narrative   2 cups of coffee a day     FAMILY HISTORY: Family History  Problem Relation Age of Onset  . Cancer Mother 62    breast and bone  . Cancer Father 72    prostate  . Cancer Cousin 20    breast cancer   . Hypertension Sister   . Bleeding Disorder Sister   . Hypertension Daughter     ALLERGIES:  is allergic to iohexol.  MEDICATIONS:  Current Outpatient Prescriptions  Medication Sig Dispense Refill  . albuterol (PROVENTIL HFA;VENTOLIN HFA) 108 (90  BASE) MCG/ACT inhaler Inhale 2 puffs into the lungs every 6 (six) hours as needed for wheezing or shortness of breath.    [name] amLODipine (NORVASC) 10 MG tablet Take 10 mg by mouth daily.    [name] BREO ELLIPTA 100-25 MCG/INH AEPB Inhale 1 puff into the lungs daily.  2  . cetirizine (ZYRTEC) 10 MG tablet TK 1 T PO QD  3  . ELIQUIS 5 MG TABS tablet     . Glycopyrrolate-Formoterol (BEVESPI AEROSPHERE) 9-4.8 MCG/ACT AERO Inhale 1 puff into the lungs 2 (two) times daily.    [name] labetalol (NORMODYNE) 200 MG tablet     . mometasone (NASONEX) 50 MCG/ACT nasal spray Place 2 sprays into the  nose daily.     No current facility-administered medications for this visit.     REVIEW OF SYSTEMS:   Constitutional: Denies fevers or abnormal night sweats (+) difficulty sleeping (+) chills (+) vertigo Eyes: Denies blurriness of vision, double vision or watery eyes Ears, nose, mouth, throat, and face: Denies mucositis or sore throat Respiratory: Denies cough, dyspnea or wheezes (+) SOB Cardiovascular: Denies palpitation, chest discomfort or lower extremity swelling Gastrointestinal:  Denies nausea, heartburn or change in bowel habits Skin: Denies abnormal skin rashes Lymphatics: Denies new lymphadenopathy or easy bruising Neurological:Denies numbness, tingling or new weaknesses Behavioral/Psych: (+) Depression All other systems were reviewed with the patient and are negative.  PHYSICAL EXAMINATION: ECOG PERFORMANCE STATUS: 2  Vitals:   01/03/17 1013  BP: (!) 176/95  Pulse: 83  Resp: 16  Temp: 98 F (36.7 C)   Filed Weights   01/03/17 1013  Weight: 235 lb 6.4 oz (106.8 kg)    GENERAL:alert, no distress and comfortable SKIN: skin color, texture, turgor are normal, no rashes or significant lesions EYES: normal, conjunctiva are pink and non-injected, sclera clear OROPHARYNX:no exudate, no erythema and lips, buccal mucosa, and tongue normal  NECK: supple, thyroid normal size, non-tender, without  nodularity LYMPH:  no palpable lymphadenopathy in the cervical, axillary or inguinal LUNGS: clear to auscultation and percussion (+) labored breathing HEART: regular rate & rhythm and no murmurs. (+) lower extremity edema ABDOMEN:abdomen soft, non-tender and normal bowel sounds Musculoskeletal:no cyanosis of digits and no clubbing  PSYCH: alert & oriented x 3 with fluent speech NEURO: no focal motor/sensory deficits  LABORATORY DATA:  I have reviewed the data as listed CBC Latest Ref Rng & Units 01/03/2017 08/05/2016 11/17/2015  WBC 3.9 - 10.3 10e3/uL 15.1(H) 12.0(H) 11.8(H)  Hemoglobin 11.6 - 15.9 g/dL 11.0(L) 10.9(L) 11.3(L)  Hematocrit 34.8 - 46.6 % 35.1 35.0 36.9  Platelets 145 - 400 10e3/uL 249 233 199    CMP Latest Ref Rng & Units 01/03/2017 08/05/2016 11/17/2015  Glucose 70 - 140 mg/dl 151(H) 106 106(H)  BUN 7.0 - 26.0 mg/dL 47.5(H) 40.6(H) 30(H)  Creatinine 0.6 - 1.1 mg/dL 3.4 Repeated and Verified(HH) 2.6(H) 2.23(H)  Sodium 136 - 145 mEq/L 139 138 138  Potassium 3.5 - 5.1 mEq/L 4.6 4.8 4.7  Chloride 101 - 111 mmol/L - - 113(H)  CO2 22 - 29 mEq/L 14(L) 15(L) 20(L)  Calcium 8.4 - 10.4 mg/dL 8.6 10.1 9.3  Total Protein 6.4 - 8.3 g/dL 7.1 7.8 -  Total Bilirubin 0.20 - 1.20 mg/dL <0.22 <0.30 -  Alkaline Phos 40 - 150 U/L 144 120 -  AST 5 - 34 U/L 19 19 -  ALT 0 - 55 U/L 20 24 -   PATHOLOGY REPORT Diagnosis 02/06/2016 Breast, left, needle core biopsy, 11:00 o'clock - FAT NECROSIS WITH ASSOCIATED CALCIFICATION. - NO ATYPIA OR TUMOR SEEN.  RADIOGRAPHIC STUDIES: I have personally reviewed the radiological images as listed and agreed with the findings in the report. No results found.  ASSESSMENT & PLAN:  60 y.o. female with chronic mild leukocytosis and anemia.  1. Recurrent DVT and PE -She previously had episodes of PE, was treated with Xarelto. 10 months after she came off Xarelto, she developed unprovoked DVT and PE. -I recommend her to continue anticoagulation indefinitely,  if no contraindications. -Patient was previously on Coumadin, but switched to Eliquis due to her transportation issues with Coumadin check up. -She has stage III CKD, Cr has been getting worse. We discussed that coumadin is a better  option for patients with severe renal dysfunction. However she has difficulty with her transportation for coumadin checkup, and wishes to continue Eliquis.  I recommeneded her to decrease Eliquis from 5mg  to 2.5mg  BID, I discussed the dosage with our pharmacist who concurred.  3. Mild leukocytosis, with predominant neutrophils -Her total white count TODAY is 15.1K, with neutrophil 11.4K. Her mild leukocytosis has been stable over the past 3 years, previous workup was negative for BCR/ABL or JAK 2 mutation. This is likely reactive, secondary to her smoking and chronic inflammations (allergy and sinus Infection). No evidence of myeloproliferative neoplasm.  -Follow-up CBC with diff   4. Anemia of chronic disease and anemia of iron deficiency -She had hemoglobin 11-12 in the past a few years, overall stable, likely related to her stage III chronic kidney disease  - Her iron study and SPEP were normal in 2013 -Iron 35 TODAY. %SAT 13. Will notify her to take an iron supplement. -Follow-up CBC once a year   5. History of colon cancer in 2009  -She has completed 5 years of surveillance.  -last colonoscopy in 05/2012, next due 2018  6. CKD, stage III -follow up with primary care physician -Creatinine 3.4 TODAY. We do not have data concerning Eliquis and Creatinine this high. Advised her to take Eliquis 2.5 mg BID.  7. OSA, PVD, CHF -She will continue follow-up with her primary care physician and other specialists. -She is more similar rheumatic with dyspnea lately, high strain caccia her to follow-up with her cardiologist  Plan -She will start Eliquis 2.5mg  BID due to her worsening CKD, I wrote down the new dosage on her medication list. -RTC in 6 months with  lab -Iron 35 TODAY. Will notify the patient that she will need to be on an iron supplement ferrous sulfate once daily. I will repeat her on study on next visit. -I will copy her PCP   All questions were answered. The patient knows to call the clinic with any problems, questions or concerns.  I spent 20 minutes counseling the patient face to face. The total time spent in the appointment was 30 minutes and more than 50% was on counseling.     Truitt Merle, MD 01/03/2017   This document serves as a record of services personally performed by Truitt Merle, MD. It was created on her behalf by Darcus Austin, a trained medical scribe. The creation of this record is based on the scribe's personal observations and the provider's statements to them. This document has been checked and approved by the attending provider.

## 2017-01-02 DIAGNOSIS — Z741 Need for assistance with personal care: Secondary | ICD-10-CM | POA: Diagnosis not present

## 2017-01-02 DIAGNOSIS — R252 Cramp and spasm: Secondary | ICD-10-CM | POA: Diagnosis not present

## 2017-01-02 DIAGNOSIS — J449 Chronic obstructive pulmonary disease, unspecified: Secondary | ICD-10-CM | POA: Diagnosis not present

## 2017-01-02 DIAGNOSIS — N184 Chronic kidney disease, stage 4 (severe): Secondary | ICD-10-CM | POA: Diagnosis not present

## 2017-01-02 DIAGNOSIS — I129 Hypertensive chronic kidney disease with stage 1 through stage 4 chronic kidney disease, or unspecified chronic kidney disease: Secondary | ICD-10-CM | POA: Diagnosis not present

## 2017-01-03 ENCOUNTER — Encounter: Payer: Self-pay | Admitting: Hematology

## 2017-01-03 ENCOUNTER — Ambulatory Visit (HOSPITAL_BASED_OUTPATIENT_CLINIC_OR_DEPARTMENT_OTHER): Payer: Medicare Other | Admitting: Hematology

## 2017-01-03 ENCOUNTER — Other Ambulatory Visit (HOSPITAL_BASED_OUTPATIENT_CLINIC_OR_DEPARTMENT_OTHER): Payer: Medicare Other

## 2017-01-03 VITALS — BP 176/95 | HR 83 | Temp 98.0°F | Resp 16 | Ht 63.0 in | Wt 235.4 lb

## 2017-01-03 DIAGNOSIS — I82412 Acute embolism and thrombosis of left femoral vein: Secondary | ICD-10-CM

## 2017-01-03 DIAGNOSIS — Z7901 Long term (current) use of anticoagulants: Secondary | ICD-10-CM | POA: Diagnosis not present

## 2017-01-03 DIAGNOSIS — Z86711 Personal history of pulmonary embolism: Secondary | ICD-10-CM | POA: Diagnosis not present

## 2017-01-03 DIAGNOSIS — G4733 Obstructive sleep apnea (adult) (pediatric): Secondary | ICD-10-CM

## 2017-01-03 DIAGNOSIS — Z86718 Personal history of other venous thrombosis and embolism: Secondary | ICD-10-CM

## 2017-01-03 DIAGNOSIS — D509 Iron deficiency anemia, unspecified: Secondary | ICD-10-CM

## 2017-01-03 DIAGNOSIS — N183 Chronic kidney disease, stage 3 unspecified: Secondary | ICD-10-CM

## 2017-01-03 DIAGNOSIS — D631 Anemia in chronic kidney disease: Secondary | ICD-10-CM

## 2017-01-03 DIAGNOSIS — I739 Peripheral vascular disease, unspecified: Secondary | ICD-10-CM

## 2017-01-03 DIAGNOSIS — N184 Chronic kidney disease, stage 4 (severe): Secondary | ICD-10-CM

## 2017-01-03 DIAGNOSIS — I509 Heart failure, unspecified: Secondary | ICD-10-CM | POA: Diagnosis not present

## 2017-01-03 DIAGNOSIS — D72829 Elevated white blood cell count, unspecified: Secondary | ICD-10-CM

## 2017-01-03 LAB — CBC WITH DIFFERENTIAL/PLATELET
BASO%: 0.7 % (ref 0.0–2.0)
Basophils Absolute: 0.1 10*3/uL (ref 0.0–0.1)
EOS%: 0.8 % (ref 0.0–7.0)
Eosinophils Absolute: 0.1 10*3/uL (ref 0.0–0.5)
HCT: 35.1 % (ref 34.8–46.6)
HGB: 11 g/dL — ABNORMAL LOW (ref 11.6–15.9)
LYMPH%: 16.4 % (ref 14.0–49.7)
MCH: 26.6 pg (ref 25.1–34.0)
MCHC: 31.3 g/dL — ABNORMAL LOW (ref 31.5–36.0)
MCV: 85 fL (ref 79.5–101.0)
MONO#: 1 10*3/uL — ABNORMAL HIGH (ref 0.1–0.9)
MONO%: 6.6 % (ref 0.0–14.0)
NEUT#: 11.4 10*3/uL — ABNORMAL HIGH (ref 1.5–6.5)
NEUT%: 75.5 % (ref 38.4–76.8)
Platelets: 249 10*3/uL (ref 145–400)
RBC: 4.12 10*6/uL (ref 3.70–5.45)
RDW: 15.9 % — ABNORMAL HIGH (ref 11.2–14.5)
WBC: 15.1 10*3/uL — ABNORMAL HIGH (ref 3.9–10.3)
lymph#: 2.5 10*3/uL (ref 0.9–3.3)

## 2017-01-03 LAB — IRON AND TIBC
%SAT: 13 % — ABNORMAL LOW (ref 21–57)
Iron: 35 ug/dL — ABNORMAL LOW (ref 41–142)
TIBC: 261 ug/dL (ref 236–444)
UIBC: 227 ug/dL (ref 120–384)

## 2017-01-03 LAB — COMPREHENSIVE METABOLIC PANEL
ALT: 20 U/L (ref 0–55)
AST: 19 U/L (ref 5–34)
Albumin: 2.7 g/dL — ABNORMAL LOW (ref 3.5–5.0)
Alkaline Phosphatase: 144 U/L (ref 40–150)
Anion Gap: 10 mEq/L (ref 3–11)
BUN: 47.5 mg/dL — ABNORMAL HIGH (ref 7.0–26.0)
CO2: 14 mEq/L — ABNORMAL LOW (ref 22–29)
Calcium: 8.6 mg/dL (ref 8.4–10.4)
Chloride: 116 mEq/L — ABNORMAL HIGH (ref 98–109)
Creatinine: 3.4 mg/dL (ref 0.6–1.1)
EGFR: 14 mL/min/{1.73_m2} — ABNORMAL LOW (ref >90)
Glucose: 151 mg/dl — ABNORMAL HIGH (ref 70–140)
Potassium: 4.6 mEq/L (ref 3.5–5.1)
Sodium: 139 mEq/L (ref 136–145)
Total Bilirubin: <0.22 mg/dL (ref 0.20–1.20)
Total Protein: 7.1 g/dL (ref 6.4–8.3)

## 2017-01-03 LAB — FERRITIN: Ferritin: 143 ng/ml (ref 9–269)

## 2017-01-06 ENCOUNTER — Telehealth: Payer: Self-pay | Admitting: *Deleted

## 2017-01-06 NOTE — Telephone Encounter (Signed)
Spoke with pt today and informed pt of Dr. Ernestina Penna instructions to take ferrous sulfate daily with orange juice or vit c to help with absorption.   Pt voiced understanding.

## 2017-01-06 NOTE — Telephone Encounter (Signed)
-----   Message from Truitt Merle, MD sent at 01/03/2017 11:19 AM EST ----- Please let her iron study today showed low iron level, and I suggest her take ferrous sulfate 1 tab daily, with orange juice or vit c to help absorption.   Thanks  Truitt Merle  01/03/2017

## 2017-01-07 ENCOUNTER — Telehealth: Payer: Self-pay | Admitting: Hematology

## 2017-01-07 NOTE — Telephone Encounter (Signed)
Confirmed with Whitney Walker that she has a 7/9 appointment

## 2017-01-20 ENCOUNTER — Other Ambulatory Visit: Payer: Self-pay | Admitting: Nurse Practitioner

## 2017-01-20 ENCOUNTER — Ambulatory Visit
Admission: RE | Admit: 2017-01-20 | Discharge: 2017-01-20 | Disposition: A | Discharge status: Home / Self Care | Payer: Medicare Other | Source: Ambulatory Visit | Attending: Nurse Practitioner | Admitting: Nurse Practitioner

## 2017-01-20 DIAGNOSIS — R0600 Dyspnea, unspecified: Secondary | ICD-10-CM

## 2017-01-20 DIAGNOSIS — J449 Chronic obstructive pulmonary disease, unspecified: Secondary | ICD-10-CM

## 2017-01-20 DIAGNOSIS — R0602 Shortness of breath: Secondary | ICD-10-CM | POA: Diagnosis not present

## 2017-01-20 DIAGNOSIS — R079 Chest pain, unspecified: Secondary | ICD-10-CM | POA: Diagnosis not present

## 2017-01-20 IMAGING — CR DG CHEST 2V
2 series · 2 of 2 positions shown · non-contrast
Comparison: PA and lateral chest x-ray [DATE]

CLINICAL DATA: COPD. Increasing shortness of breath since early
[DATE]. The patient also notes left lower anterior chest pain.
History of chronic CHF, former smoker.

EXAM:
CHEST  2 VIEW

[w chest pa]
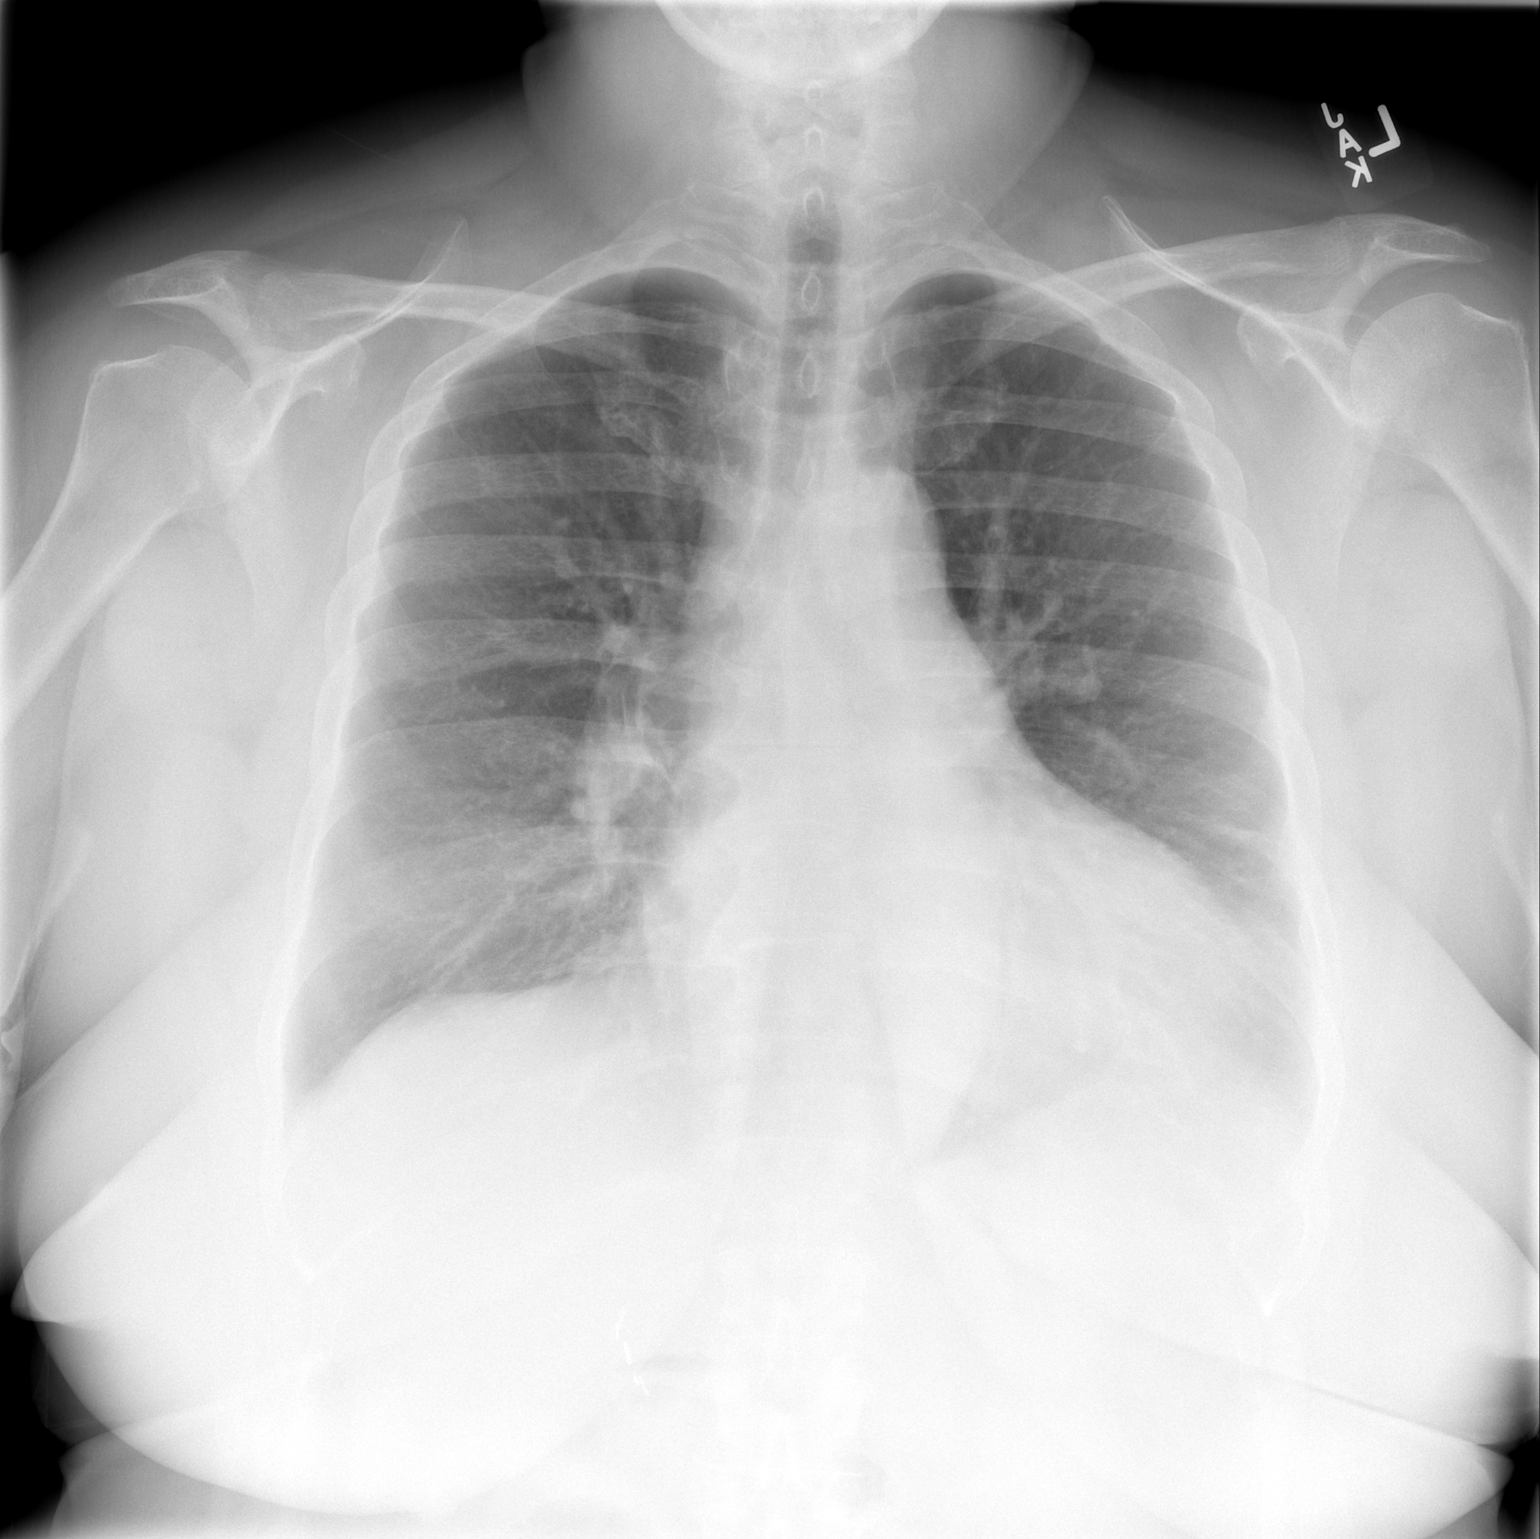

[w chest lat]
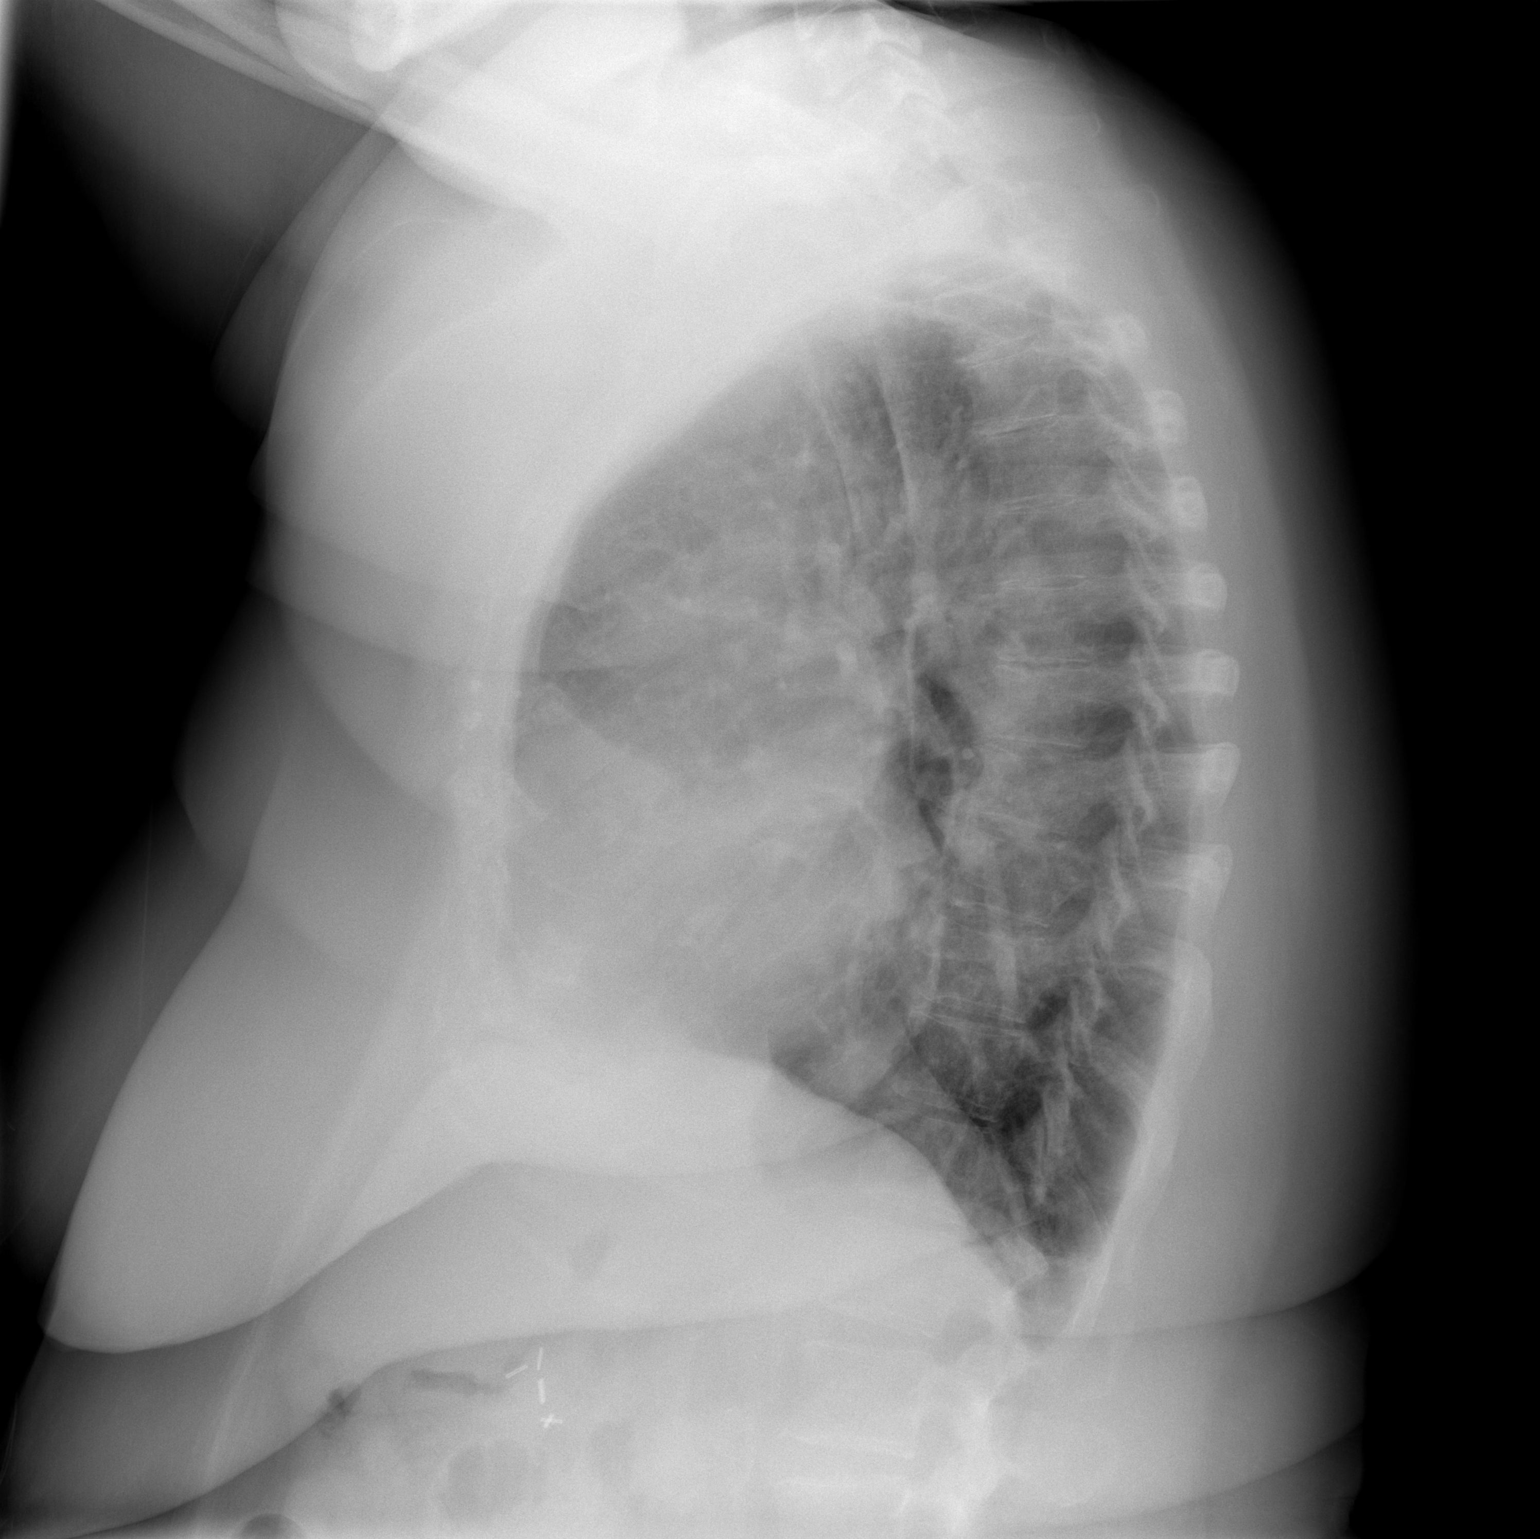

[2 of 2 positions shown; findings below may reference images not displayed]

FINDINGS: The lungs are adequately inflated. There is no focal infiltrate.
There is no pleural effusion. The cardiac silhouette is top-normal
in size but stable. The central pulmonary vascularity is engorged
but also stable. The trachea is midline. There is degenerative disc
space narrowing at multiple thoracic levels.
IMPRESSION: Mild hyperinflation may reflect known COPD. There is mild
cardiomegaly and pulmonary vascular congestion which suggests
low-grade CHF.

## 2017-02-03 ENCOUNTER — Ambulatory Visit: Payer: Medicare Other | Admitting: Adult Health

## 2017-02-19 ENCOUNTER — Other Ambulatory Visit: Payer: Self-pay | Admitting: Hematology

## 2017-02-19 ENCOUNTER — Telehealth: Payer: Self-pay

## 2017-02-19 MED ORDER — APIXABAN 2.5 MG PO TABS: 2.5000 mg | ORAL_TABLET | Freq: Two times a day (BID) | ORAL | 0 refills | Status: DC

## 2017-02-19 NOTE — Telephone Encounter (Signed)
Saw electronic refill for eliquis and 1/5 OV note that stated decrease from 5 mg bid to 2.5 mg bid. Called pt to ask her if she wants the refill in the 2.5 mg size tablet BID. She was confused on Dr Ernestina Penna spoken directions on 1/5 and has only been taking 2.5 mg daily instead of BID. Instructed her to increase back to BID and will let Dr Burr Medico know in case any changes need to be made. Sent refill in for  2.5 mg BID #60 no refills.

## 2017-02-19 NOTE — Telephone Encounter (Signed)
Yes, she should be on 2.5mg  bid, thanks.   Truitt Merle MD

## 2017-02-21 ENCOUNTER — Institutional Professional Consult (permissible substitution): Payer: Self-pay | Admitting: Internal Medicine

## 2017-02-21 NOTE — Telephone Encounter (Signed)
S/w pt that Dr Burr Medico verified eliquis is 2.5 mg BID. Pt spoke back information.

## 2017-03-02 ENCOUNTER — Encounter (HOSPITAL_COMMUNITY): Payer: Self-pay

## 2017-03-02 ENCOUNTER — Emergency Department (HOSPITAL_COMMUNITY): Payer: Medicare Other

## 2017-03-02 ENCOUNTER — Observation Stay (HOSPITAL_COMMUNITY): Payer: Medicare Other

## 2017-03-02 ENCOUNTER — Observation Stay (HOSPITAL_COMMUNITY)
Admission: EM | Admit: 2017-03-02 | Discharge: 2017-03-03 | Disposition: A | Discharge status: Home / Self Care | Payer: Medicare Other | Attending: Internal Medicine | Admitting: Internal Medicine

## 2017-03-02 DIAGNOSIS — Z87891 Personal history of nicotine dependence: Secondary | ICD-10-CM | POA: Diagnosis not present

## 2017-03-02 DIAGNOSIS — Z7901 Long term (current) use of anticoagulants: Secondary | ICD-10-CM | POA: Diagnosis not present

## 2017-03-02 DIAGNOSIS — Z79899 Other long term (current) drug therapy: Secondary | ICD-10-CM | POA: Insufficient documentation

## 2017-03-02 DIAGNOSIS — R112 Nausea with vomiting, unspecified: Secondary | ICD-10-CM | POA: Diagnosis not present

## 2017-03-02 DIAGNOSIS — D631 Anemia in chronic kidney disease: Secondary | ICD-10-CM | POA: Insufficient documentation

## 2017-03-02 DIAGNOSIS — I130 Hypertensive heart and chronic kidney disease with heart failure and stage 1 through stage 4 chronic kidney disease, or unspecified chronic kidney disease: Secondary | ICD-10-CM | POA: Insufficient documentation

## 2017-03-02 DIAGNOSIS — R079 Chest pain, unspecified: Secondary | ICD-10-CM | POA: Diagnosis not present

## 2017-03-02 DIAGNOSIS — I209 Angina pectoris, unspecified: Secondary | ICD-10-CM | POA: Diagnosis present

## 2017-03-02 DIAGNOSIS — I2699 Other pulmonary embolism without acute cor pulmonale: Principal | ICD-10-CM | POA: Diagnosis present

## 2017-03-02 DIAGNOSIS — I2510 Atherosclerotic heart disease of native coronary artery without angina pectoris: Secondary | ICD-10-CM | POA: Diagnosis not present

## 2017-03-02 DIAGNOSIS — Z7951 Long term (current) use of inhaled steroids: Secondary | ICD-10-CM | POA: Insufficient documentation

## 2017-03-02 DIAGNOSIS — R0602 Shortness of breath: Secondary | ICD-10-CM

## 2017-03-02 DIAGNOSIS — Z86718 Personal history of other venous thrombosis and embolism: Secondary | ICD-10-CM | POA: Insufficient documentation

## 2017-03-02 DIAGNOSIS — Z86711 Personal history of pulmonary embolism: Secondary | ICD-10-CM | POA: Insufficient documentation

## 2017-03-02 DIAGNOSIS — Z85038 Personal history of other malignant neoplasm of large intestine: Secondary | ICD-10-CM | POA: Diagnosis not present

## 2017-03-02 DIAGNOSIS — N184 Chronic kidney disease, stage 4 (severe): Secondary | ICD-10-CM | POA: Insufficient documentation

## 2017-03-02 DIAGNOSIS — J449 Chronic obstructive pulmonary disease, unspecified: Secondary | ICD-10-CM | POA: Insufficient documentation

## 2017-03-02 DIAGNOSIS — I5032 Chronic diastolic (congestive) heart failure: Secondary | ICD-10-CM | POA: Diagnosis present

## 2017-03-02 DIAGNOSIS — I10 Essential (primary) hypertension: Secondary | ICD-10-CM | POA: Diagnosis present

## 2017-03-02 DIAGNOSIS — R0781 Pleurodynia: Secondary | ICD-10-CM | POA: Diagnosis present

## 2017-03-02 DIAGNOSIS — Z888 Allergy status to other drugs, medicaments and biological substances status: Secondary | ICD-10-CM | POA: Diagnosis not present

## 2017-03-02 LAB — CBC
HCT: 33.4 % — ABNORMAL LOW (ref 36.0–46.0)
Hemoglobin: 10.3 g/dL — ABNORMAL LOW (ref 12.0–15.0)
MCH: 25.8 pg — ABNORMAL LOW (ref 26.0–34.0)
MCHC: 30.8 g/dL (ref 30.0–36.0)
MCV: 83.5 fL (ref 78.0–100.0)
Platelets: 255 10*3/uL (ref 150–400)
RBC: 4 MIL/uL (ref 3.87–5.11)
RDW: 14.7 % (ref 11.5–15.5)
WBC: 12.2 10*3/uL — ABNORMAL HIGH (ref 4.0–10.5)

## 2017-03-02 LAB — BASIC METABOLIC PANEL
Anion gap: 7 (ref 5–15)
BUN: 45 mg/dL — ABNORMAL HIGH (ref 6–20)
CO2: 18 mmol/L — ABNORMAL LOW (ref 22–32)
Calcium: 8.5 mg/dL — ABNORMAL LOW (ref 8.9–10.3)
Chloride: 113 mmol/L — ABNORMAL HIGH (ref 101–111)
Creatinine, Ser: 3.81 mg/dL — ABNORMAL HIGH (ref 0.44–1.00)
GFR calc Af Amer: 14 mL/min — ABNORMAL LOW (ref >60)
GFR calc non Af Amer: 12 mL/min — ABNORMAL LOW (ref >60)
Glucose, Bld: 104 mg/dL — ABNORMAL HIGH (ref 65–99)
Potassium: 4.5 mmol/L (ref 3.5–5.1)
Sodium: 138 mmol/L (ref 135–145)

## 2017-03-02 LAB — I-STAT TROPONIN, ED: Troponin i, poc: 0.02 ng/mL (ref 0.00–0.08)

## 2017-03-02 IMAGING — DX DG CHEST 2V
2 series · 2 of 2 positions shown · non-contrast
Comparison: Chest radiograph performed [DATE]

CLINICAL DATA: Acute onset of left anterior chest pain, radiating
to the left neck and arm. Exertional shortness of breath. Initial
encounter.

EXAM:
CHEST  2 VIEW

[chest pa]
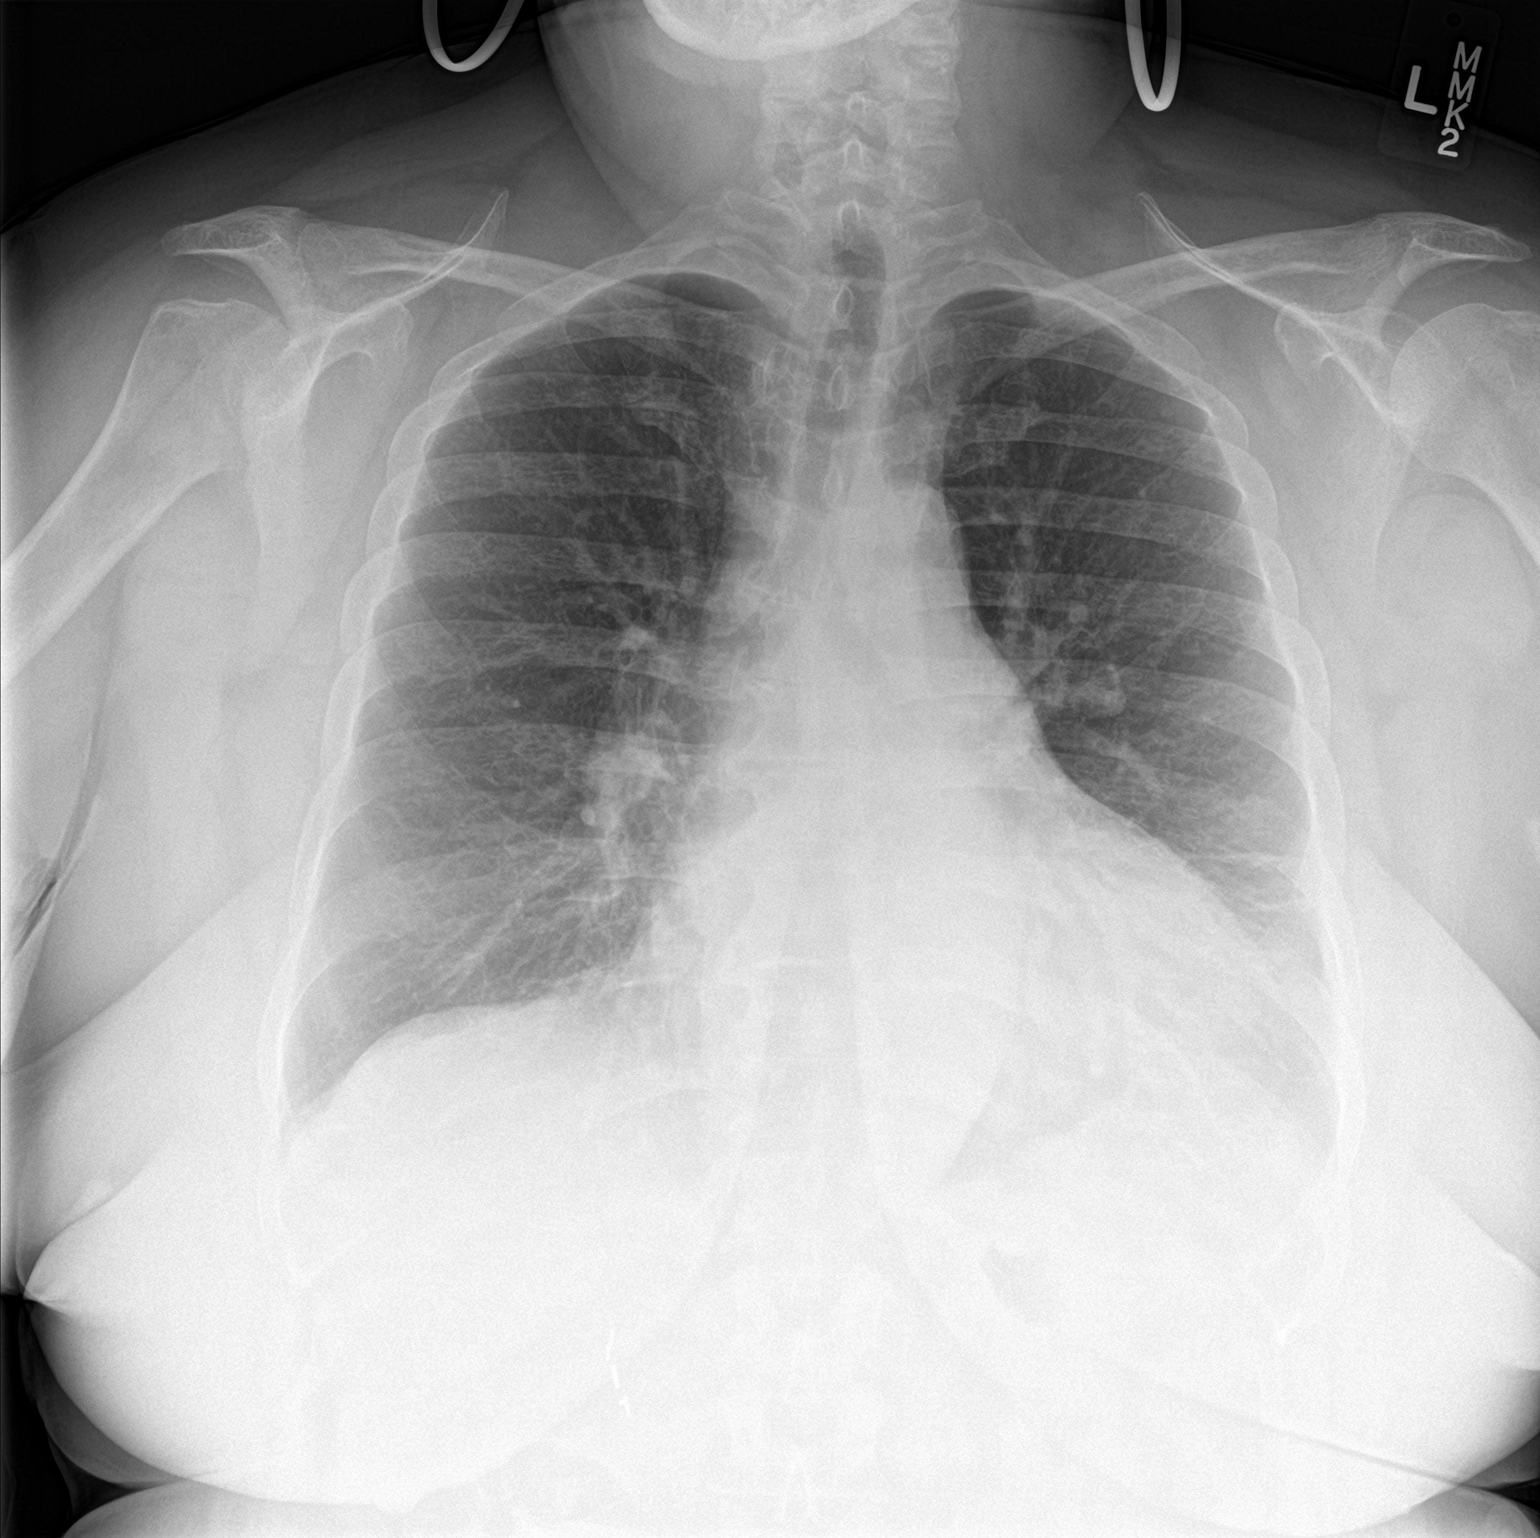

[chest lat]
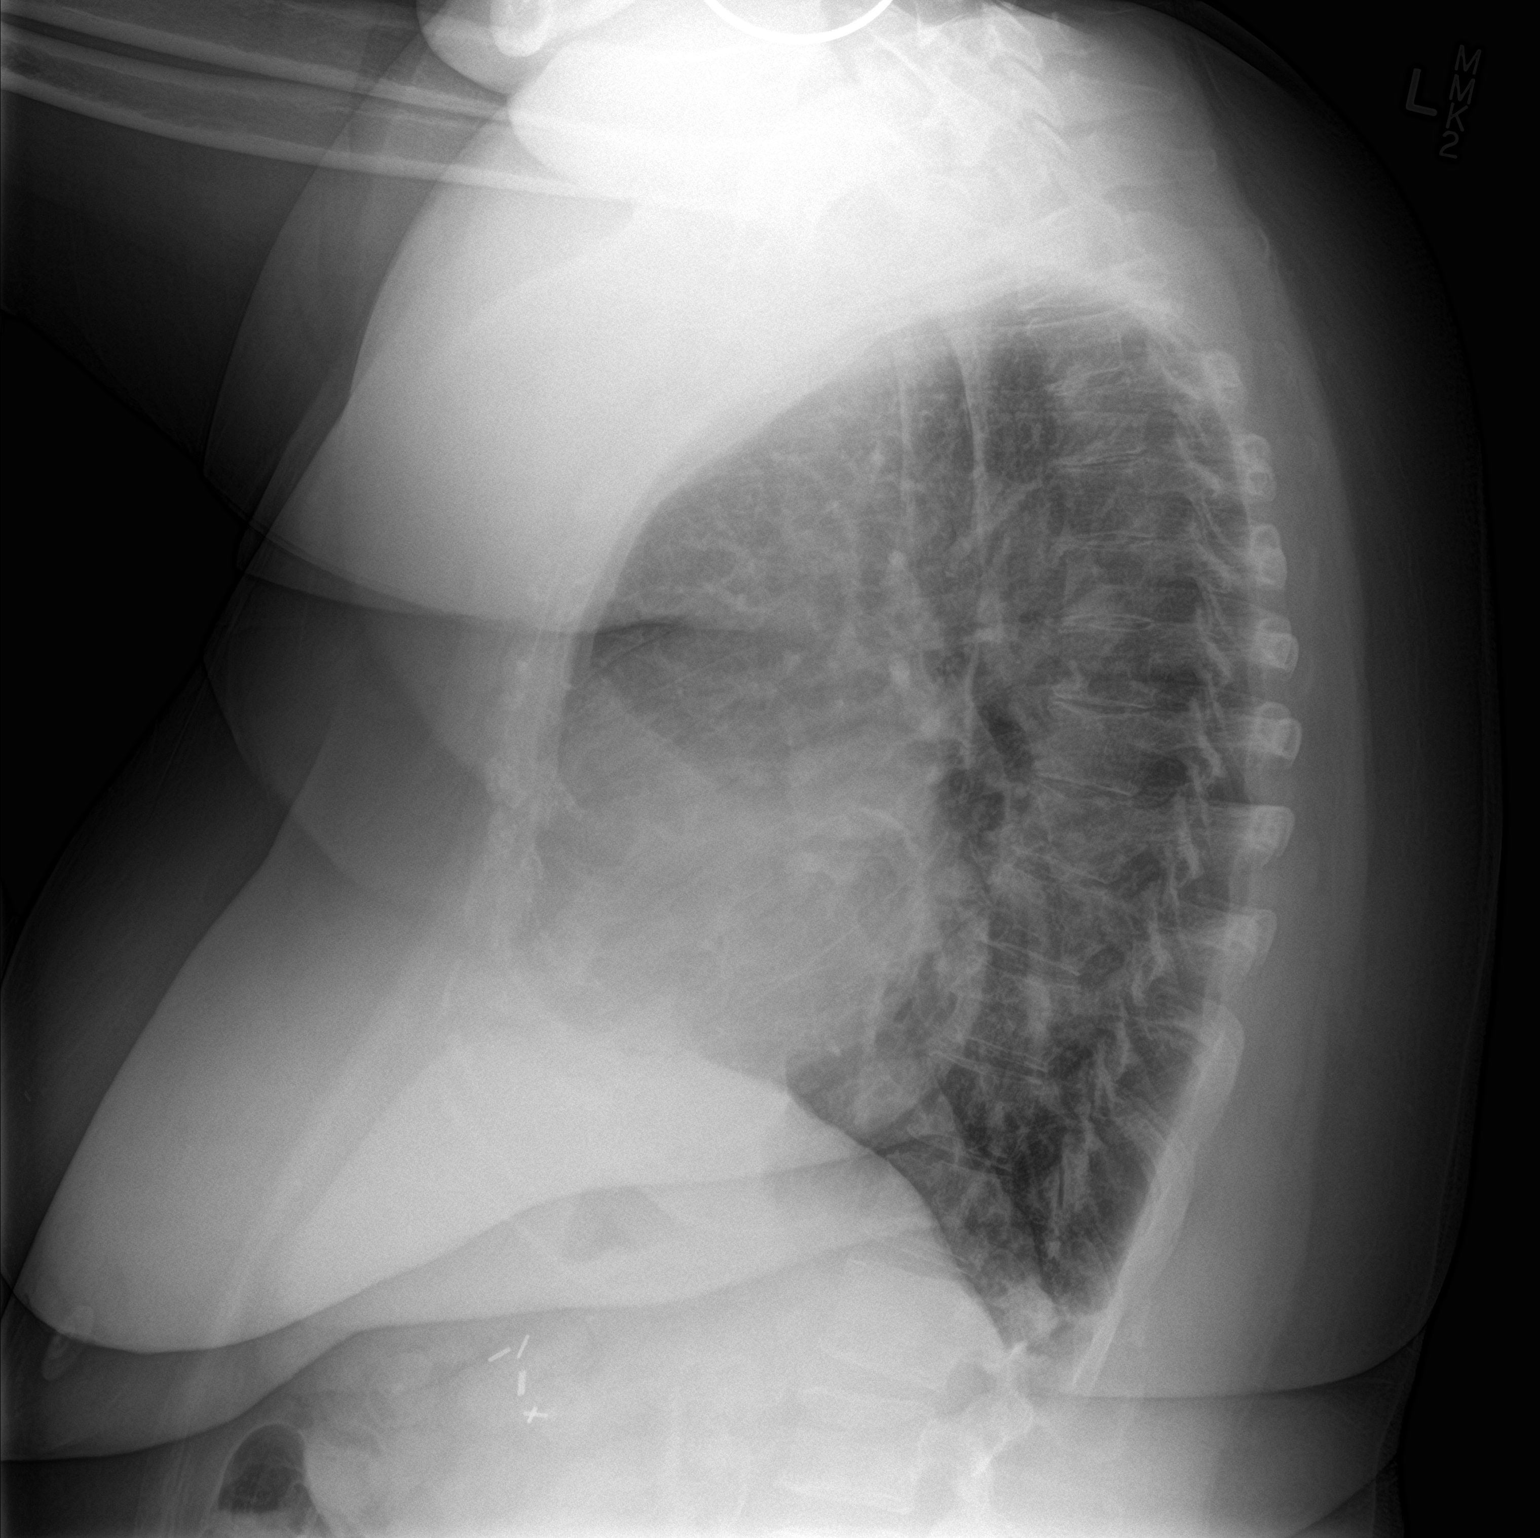

[2 of 2 positions shown; findings below may reference images not displayed]

FINDINGS: The lungs are well-aerated. Mild left basilar atelectasis or
scarring is noted. Mild chronic vascular congestion is seen. There
is no evidence of pleural effusion or pneumothorax.

The heart is mildly enlarged. No acute osseous abnormalities are
seen. Clips are noted within the right upper quadrant, reflecting
prior cholecystectomy.
IMPRESSION: Mild left basilar atelectasis or scarring noted. Mild chronic
vascular congestion seen. Mild cardiomegaly.

## 2017-03-02 MED ORDER — HYDROMORPHONE HCL 2 MG/ML IJ SOLN
1.0000 mg | Freq: Once | INTRAMUSCULAR | Status: AC
  Administered 2017-03-02: 1 mg via INTRAVENOUS
  Filled 2017-03-02: qty 1

## 2017-03-02 MED ORDER — ONDANSETRON HCL 4 MG/2ML IJ SOLN
4.0000 mg | Freq: Once | INTRAMUSCULAR | Status: AC
  Administered 2017-03-02: 4 mg via INTRAVENOUS
  Filled 2017-03-02: qty 2

## 2017-03-02 MED ORDER — MOMETASONE FURO-FORMOTEROL FUM 100-5 MCG/ACT IN AERO: 2.0000 | INHALATION_SPRAY | Freq: Two times a day (BID) | RESPIRATORY_TRACT | Status: DC

## 2017-03-02 MED ORDER — ASPIRIN EC 325 MG PO TBEC
325.0000 mg | DELAYED_RELEASE_TABLET | Freq: Every day | ORAL | Status: DC
  Administered 2017-03-03: 325 mg via ORAL
  Filled 2017-03-02: qty 1

## 2017-03-02 MED ORDER — ALBUTEROL SULFATE (2.5 MG/3ML) 0.083% IN NEBU: 3.0000 mL | INHALATION_SOLUTION | Freq: Four times a day (QID) | RESPIRATORY_TRACT | Status: DC | PRN

## 2017-03-02 MED ORDER — LORATADINE 10 MG PO TABS
10.0000 mg | ORAL_TABLET | Freq: Every day | ORAL | Status: DC
  Administered 2017-03-03: 10 mg via ORAL
  Filled 2017-03-02: qty 1

## 2017-03-02 MED ORDER — ACETAMINOPHEN 325 MG PO TABS: 650.0000 mg | ORAL_TABLET | ORAL | Status: DC | PRN

## 2017-03-02 MED ORDER — ONDANSETRON HCL 4 MG/2ML IJ SOLN
4.0000 mg | Freq: Once | INTRAMUSCULAR | Status: DC
  Filled 2017-03-02: qty 2

## 2017-03-02 MED ORDER — ONDANSETRON HCL 4 MG/2ML IJ SOLN: 4.0000 mg | Freq: Four times a day (QID) | INTRAMUSCULAR | Status: DC | PRN

## 2017-03-02 MED ORDER — AMLODIPINE BESYLATE 10 MG PO TABS
10.0000 mg | ORAL_TABLET | Freq: Every day | ORAL | Status: DC
  Administered 2017-03-03: 10 mg via ORAL
  Filled 2017-03-02: qty 1

## 2017-03-02 MED ORDER — FLUTICASONE FUROATE-VILANTEROL 100-25 MCG/INH IN AEPB
1.0000 | INHALATION_SPRAY | Freq: Every day | RESPIRATORY_TRACT | Status: DC
  Filled 2017-03-02: qty 28

## 2017-03-02 MED ORDER — HEPARIN (PORCINE) IN NACL 100-0.45 UNIT/ML-% IJ SOLN
1300.0000 [IU]/h | INTRAMUSCULAR | Status: DC
  Administered 2017-03-02: 1300 [IU]/h via INTRAVENOUS
  Filled 2017-03-02: qty 250

## 2017-03-02 MED ORDER — PROMETHAZINE HCL 25 MG/ML IJ SOLN
12.5000 mg | Freq: Once | INTRAMUSCULAR | Status: AC
  Administered 2017-03-02: 12.5 mg via INTRAVENOUS
  Filled 2017-03-02: qty 1

## 2017-03-02 MED ORDER — MORPHINE SULFATE (PF) 4 MG/ML IV SOLN
4.0000 mg | Freq: Once | INTRAVENOUS | Status: DC
  Filled 2017-03-02: qty 1

## 2017-03-02 MED ORDER — FLUTICASONE PROPIONATE 50 MCG/ACT NA SUSP
2.0000 | Freq: Every day | NASAL | Status: DC
  Administered 2017-03-03: 2 via NASAL
  Filled 2017-03-02: qty 16

## 2017-03-02 MED ORDER — LABETALOL HCL 200 MG PO TABS
200.0000 mg | ORAL_TABLET | Freq: Two times a day (BID) | ORAL | Status: DC
  Administered 2017-03-03: 200 mg via ORAL
  Filled 2017-03-02: qty 1

## 2017-03-02 NOTE — H&P (Signed)
History and Physical    Whitney Walker ERX:[id number] DOB: [birth year] DOA: 03/02/2017  PCP: [name]  Patient coming from: Home.  Chief Complaint: Chest pain.  HPI: Whitney Walker is a 61 y.o. female with history of recurrent DVT, chronic leukocytosis being followed by oncologist, chronic kidney disease stage IV, chronic anemia, COPD presents to the ER because of worsening chest pain. Patient has been having chest pain for last few weeks but last few days has increased in intensity and has moved to the left cerebral chest. Patient states the pain was initially around the neck area. Pain is mostly on deep breathing. Denies any productive cough fever or chills. [name] Apixaban dose was decreased to half last 2 weeks ago due to worsening renal function. Patient states she has been compliant with her medications.   ED Course: Chest x-ray EKG and cardiac markers were unremarkable. Since patient had similar presentation when she had pulmonary embolism last time and patient had recent decrease in her Apixaban dose heparin was started and VQ scan was planned to be done. Note that patient has chronic kidney disease and CAT scan angiogram cannot be done.  Review of Systems: As per HPI, rest all negative.   Past Medical History:  Diagnosis Date  . Anemia   . Anxiety   . Arthritis   . CHF (congestive heart failure) ([place])   . CKD (chronic kidney disease)    Stage III 03/2013  . Colon cancer ([place])   . COPD (chronic obstructive pulmonary disease) ([place])   . Coronary artery disease   . Depression   . Hypertension   . Meningitis   . PE (pulmonary embolism)   . Peripheral vascular disease ([place])   . Shortness of breath   . Sleep apnea   . Vertigo     Past Surgical History:  Procedure Laterality Date  . ABDOMINAL AORTAGRAM N/A 07/26/2013   Procedure: ABDOMINAL Maxcine Ham;  Surgeon: [name] [place], MD;  Location: [facility] CATH LAB;  Service: Cardiovascular;  Laterality: N/A;  . ABDOMINAL HYSTERECTOMY     . CESAREAN SECTION     X 3 [id number]  . CHOLECYSTECTOMY    . CHOLECYSTECTOMY  1980/s  . COLECTOMY  2010  . DIVERTICULOSIS SURGERY-2002  2012  . FEMORAL-POPLITEAL BYPASS GRAFT Left 08/13/2013   Procedure: BYPASS GRAFT FEMORAL-POPLITEAL ARTERY WITH NON-REVERSED SAPHANEOUS VEIN; ULTRASOUND GUIDED;  Surgeon: Mal [name], MD;  Location: [facility];  Service: Vascular;  Laterality: Left;  . LOWER EXTREMITY ANGIOGRAM Left 07/26/2013   Procedure: LOWER EXTREMITY ANGIOGRAM;  Surgeon: [name] [place], MD;  Location: [facility] CATH LAB;  Service: Cardiovascular;  Laterality: Left;     reports that she quit smoking about 2 years ago. Her smoking use included Cigarettes. She has a 60.00 pack-year smoking history. She uses smokeless tobacco. She reports that she does not drink alcohol or use drugs.  Allergies  Allergen Reactions  . Gadolinium Derivatives Hives and Other (See Comments)    HIVES, Desc: HIVES W/ "DYE" USED FOR 1ST CT SCAN BUT NOT 2ND, NO PREMEDS USED, PT UNCERTAIN OF CIRCUMSTANCES,,?POSSIBLE MRI CONTRAST ALLERGY, ALL STUDIES DONE "SOMEWHERE" IN [place]//A.C., Onset Date: [id number]  . Iohexol Other (See Comments)     Code: HIVES, Desc: HIVES W/ "DYE" USED FOR 1ST CT SCAN BUT NOT 2ND, NO PREMEDS USED, PT UNCERTAIN OF CIRCUMSTANCES,,?POSSIBLE MRI CONTRAST ALLERGY, ALL STUDIES DONE "SOMEWHERE" IN [place]//A.C., Onset Date: [id number]     Family History  Problem Relation Age of Onset  . Cancer Mother 2  breast and bone  . Cancer Father 72    prostate  . Hypertension Sister   . Bleeding Disorder Sister   . Cancer Cousin 20    breast cancer   . Hypertension Daughter     Prior to Admission medications   Medication Sig Start Date End Date Taking? Authorizing Provider  acetaminophen (TYLENOL) 500 MG tablet Take 500 mg by mouth every 6 (six) hours as needed.   Yes Historical Provider, MD  albuterol (PROVENTIL HFA;VENTOLIN HFA) 108 (90 BASE) MCG/ACT inhaler Inhale 2 puffs into the lungs  every 6 (six) hours as needed for wheezing or shortness of breath.   Yes Historical Provider, MD  amLODipine (NORVASC) 10 MG tablet Take 10 mg by mouth daily.   Yes Historical Provider, MD  apixaban (ELIQUIS) 2.5 MG TABS tablet Take 1 tablet (2.5 mg total) by mouth 2 (two) times daily. 02/19/17  Yes Truitt Merle, MD  BREO ELLIPTA 100-25 MCG/INH AEPB Inhale 1 puff into the lungs daily. 11/06/16  Yes Historical Provider, MD  cetirizine (ZYRTEC) 10 MG tablet TK 1 T PO QD 05/17/16  Yes Historical Provider, MD  Fluticasone-Salmeterol (ADVAIR) 100-50 MCG/DOSE AEPB Inhale 1 puff into the lungs every evening.   Yes Historical Provider, MD  labetalol (NORMODYNE) 200 MG tablet Take 200 mg by mouth 2 (two) times daily.  07/25/16  Yes Historical Provider, MD  mometasone (NASONEX) 50 MCG/ACT nasal spray Place 2 sprays into the nose daily. 07/25/16  Yes Historical Provider, MD    Physical Exam: Vitals:   03/02/17 1522 03/02/17 1930 03/02/17 2030 03/02/17 2153  BP:  156/63 135/71 135/68  Pulse:  73 70 70  Resp:  (!) 29 15 15   Temp:    97.8 F (36.6 C)  TempSrc:    Oral  SpO2:  96% 95% 95%  Weight: 108.9 kg (240 lb)   103.7 kg (228 lb 9.6 oz)  Height: 5\' 3"  (1.6 m)   5\' 3"  (1.6 m)      Constitutional: Moderately built and nourished. Vitals:   03/02/17 1522 03/02/17 1930 03/02/17 2030 03/02/17 2153  BP:  156/63 135/71 135/68  Pulse:  73 70 70  Resp:  (!) 29 15 15   Temp:    97.8 F (36.6 C)  TempSrc:    Oral  SpO2:  96% 95% 95%  Weight: 108.9 kg (240 lb)   103.7 kg (228 lb 9.6 oz)  Height: 5\' 3"  (1.6 m)   5\' 3"  (1.6 m)   Eyes: Anicteric. No pallor. ENMT: No discharge from the ears eyes nose and mouth. Neck: No neck rigidity no mass felt. No JVD appreciated. Respiratory: No rhonchi or crepitations. Cardiovascular: S1-S2 heard no murmurs appreciated. Abdomen: Soft nontender bowel sounds present. No guarding or rigidity. Musculoskeletal: No edema. Skin: No rash. Skin appears warm. Neurologic: Alert  awake oriented to time place and person. Moves all extremities. Psychiatric: Appears normal. Normal affect.   Labs on Admission: I have personally reviewed following labs and imaging studies  CBC:  Recent Labs Lab 03/02/17 1523  WBC 12.2*  HGB 10.3*  HCT 33.4*  MCV 83.5  PLT 102   Basic Metabolic Panel:  Recent Labs Lab 03/02/17 1523  NA 138  K 4.5  CL 113*  CO2 18*  GLUCOSE 104*  BUN 45*  CREATININE 3.81*  CALCIUM 8.5*   GFR: Estimated Creatinine Clearance: 17.8 mL/min (by C-G formula based on SCr of 3.81 mg/dL (H)). Liver Function Tests: No results for input(s): AST, ALT, ALKPHOS, BILITOT,  PROT, ALBUMIN in the last 168 hours. No results for input(s): LIPASE, AMYLASE in the last 168 hours. No results for input(s): AMMONIA in the last 168 hours. Coagulation Profile: No results for input(s): INR, PROTIME in the last 168 hours. Cardiac Enzymes: No results for input(s): CKTOTAL, CKMB, CKMBINDEX, TROPONINI in the last 168 hours. BNP (last 3 results) No results for input(s): PROBNP in the last 8760 hours. HbA1C: No results for input(s): HGBA1C in the last 72 hours. CBG: No results for input(s): GLUCAP in the last 168 hours. Lipid Profile: No results for input(s): CHOL, HDL, LDLCALC, TRIG, CHOLHDL, LDLDIRECT in the last 72 hours. Thyroid Function Tests: No results for input(s): TSH, T4TOTAL, FREET4, T3FREE, THYROIDAB in the last 72 hours. Anemia Panel: No results for input(s): VITAMINB12, FOLATE, FERRITIN, TIBC, IRON, RETICCTPCT in the last 72 hours. Urine analysis:    Component Value Date/Time   COLORURINE YELLOW 10/19/2015 1532   APPEARANCEUR CLOUDY (A) 10/19/2015 1532   LABSPEC 1.016 10/19/2015 1532   PHURINE 5.0 10/19/2015 1532   GLUCOSEU NEGATIVE 10/19/2015 1532   HGBUR NEGATIVE 10/19/2015 [place] 10/19/2015 1532   Lupus 10/19/2015 1532   PROTEINUR >300 (A) 10/19/2015 1532   UROBILINOGEN 0.2 10/19/2015 1532   NITRITE  NEGATIVE 10/19/2015 1532   LEUKOCYTESUR NEGATIVE 10/19/2015 1532   Sepsis Labs: @LABRCNTIP (procalcitonin:4,lacticidven:4) )No results found for this or any previous visit (from the past 240 hour(s)).   Radiological Exams on Admission: Dg Chest 2 View  Result Date: 03/02/2017 CLINICAL DATA:  Acute onset of left anterior chest pain, radiating to the left neck and arm. Exertional shortness of breath. Initial encounter. EXAM: CHEST  2 VIEW COMPARISON:  Chest radiograph performed 01/20/2017 FINDINGS: The lungs are well-aerated. Mild left basilar atelectasis or scarring is noted. Mild chronic vascular congestion is seen. There is no evidence of pleural effusion or pneumothorax. The heart is mildly enlarged. No acute osseous abnormalities are seen. Clips are noted within the right upper quadrant, reflecting prior cholecystectomy. IMPRESSION: Mild left basilar atelectasis or scarring noted. Mild chronic vascular congestion seen. Mild cardiomegaly. Electronically Signed   By: Garald Balding M.D.   On: 03/02/2017 16:13    EKG: Independently reviewed. Normal sinus rhythm.  Assessment/Plan Principal Problem:   Chest pain Active Problems:   Pulmonary embolism (HCC)   CKD (chronic kidney disease) stage 4, GFR 15-29 ml/min (HCC)   Chronic diastolic heart failure (HCC)   Essential hypertension   History of colon cancer    1. Chest pain - appears atypical and pleuritic in nature. Patient has been placed on heparin infusion. VQ scan has been ordered. Cycle cardiac markers check 2-D echo. Note that after patient was given Dilaudid patient started developing nausea vomiting. Abdomen is appearing benign. We'll check LFTs and KUB. Consult cardiologist in a.m. If patient does become positive for pulmonary embolism then may consult patient's oncologist Dr. Burr Medico in a.m. 2. Hypertension on labetalol. 3. COPD presently not wheezing. Continue home inhalers. 4. Peripheral vascular disease status post bypass no acute  issues. 5. Chronic disease stage IV - follow creatinine. 6. Chronic leukocytosis and anemia - follow CBC. 7. History of colon cancer in remission. 8. Chronic diastolic CHF last EF measured in 2014 was 65-70% with grade 1 diastolic dysfunction - appears euvolemic.   DVT prophylaxis: Heparin infusion. Code Status: Full code.  Family Communication: Discussed with patient.  Disposition Plan: Home.  Consults called: None.  Admission status: Observation.    Rise Patience MD [facility] Pager  [place] [phone].  If 7PM-7AM, please contact night-coverage [email or web address] Password [email or web address]  03/02/2017, 11:21 PM

## 2017-03-02 NOTE — Progress Notes (Signed)
Pt arrived to 2W from ED.  Telemetry applied and CCMD notified x2.  Pt oriented to room including call light and telephone.  Awaiting further orders from admitting to provide Pt with plan of care.  Pt denies chest pain at this time.  Pt with small clear yellow emesis.  Pt states she has not eaten today, only had a cup of coffee at breakfast.  Pt states she has been nauseous but no emesis until she received IV dilaudid in ED.  Will cont to monitor.

## 2017-03-02 NOTE — Progress Notes (Signed)
ANTICOAGULATION CONSULT NOTE - Initial Consult  Pharmacy Consult for heparin Indication: pulmonary embolus  Allergies  Allergen Reactions  . Gadolinium Derivatives Hives and Other (See Comments)    HIVES, Desc: HIVES W/ "DYE" USED FOR 1ST CT SCAN BUT NOT 2ND, NO PREMEDS USED, PT UNCERTAIN OF CIRCUMSTANCES,,?POSSIBLE MRI CONTRAST ALLERGY, ALL STUDIES DONE "SOMEWHERE" IN [place]//A.C., Onset Date: [id number]  . Iohexol Other (See Comments)     Code: HIVES, Desc: HIVES W/ "DYE" USED FOR 1ST CT SCAN BUT NOT 2ND, NO PREMEDS USED, PT UNCERTAIN OF CIRCUMSTANCES,,?POSSIBLE MRI CONTRAST ALLERGY, ALL STUDIES DONE "SOMEWHERE" IN [place]//A.C., Onset Date: [id number]     Patient Measurements: Height: 5\' 3"  (160 cm) Weight: 240 lb (108.9 kg) IBW/kg (Calculated) : 52.4 Heparin Dosing Weight: 78 kg  Vital Signs: Temp: 98 F (36.7 C) (03/04 1521) BP: 125/75 (03/04 1521) Pulse Rate: 71 (03/04 1521)  Labs:  Recent Labs  03/02/17 1523  HGB 10.3*  HCT 33.4*  PLT 255  CREATININE 3.81*    Estimated Creatinine Clearance: 18.4 mL/min (by C-G formula based on SCr of 3.81 mg/dL (H)).   Medical History: Past Medical History:  Diagnosis Date  . Anemia   . Anxiety   . Arthritis   . CHF (congestive heart failure) ([place])   . CKD (chronic kidney disease)    Stage III 03/2013  . Colon cancer ([place])   . COPD (chronic obstructive pulmonary disease) ([place])   . Coronary artery disease   . Depression   . Hypertension   . Meningitis   . PE (pulmonary embolism)   . Peripheral vascular disease ([place])   . Shortness of breath   . Sleep apnea   . Vertigo    Assessment: 61 yo f presenting with new onset CP/SOB and renal failure - needs IV hep for PE  Last apixaban dose 1100 3/4  H&H 10.3/33.4, Plt 255  SCr 3.81  Goal of Therapy:  Heparin level 0.3-0.7 units/ml Monitor platelets by anticoagulation protocol: Yes   Plan:  Heparin no bolus @ 2300 Initial infusion 1300 units/hr Daily  HL/APTT, CBC (follow aptts until apixaban affects have worn off)  Levester Fresh, PharmD, BCPS, BCCCP Clinical Pharmacist 03/02/2017 8:06 PM

## 2017-03-02 NOTE — ED Provider Notes (Signed)
[facility] DEPT Provider Note   CSN: [id number] Arrival date & time: 03/02/17  1508      History   Chief Complaint Chief Complaint  Patient presents with  . Chest Pain    HPI Whitney Walker is a 61 y.o. female.   61 year old woman with past medical history unprovoked PE and DVTs, anemia, diastolic dysfunction, COPD not on home oxygen, colon cancer status post resection. She presents with left sided chest, neck, and arm pain which has been ongoing for months but became suddenly worse last week. The chest pain is sharp, comes and goes throughout the day some days it is worse than others. It is worsened by deep breathing, it is not associated with activity. This pain is associated with difficulty breathing, cough, nausea, and a sensation of heart flutter. She also has left leg pain which has been ongoing since 2014 at the time of femoral popliteal bypass grafting. She feels similar to what she experienced with PE in the past. She denies chest wall tenderness, sore throat, myalgia, or wheeze. She is sedentary. She has not been on any trips recently. Of note, at a recent office visit eliquis 5 mg BID was decreased to 2.5 mg BID for worsening creatinine.       Past Medical History:  Diagnosis Date  . Anemia   . Anxiety   . Arthritis   . CHF (congestive heart failure) ([place])   . CKD (chronic kidney disease)    Stage III 03/2013  . Colon cancer ([place])   . COPD (chronic obstructive pulmonary disease) ([place])   . Coronary artery disease   . Depression   . Hypertension   . Meningitis   . PE (pulmonary embolism)   . Peripheral vascular disease ([place])   . Shortness of breath   . Sleep apnea   . Vertigo     Patient Active Problem List   Diagnosis Date Noted  . Anemia of chronic kidney failure, stage 3 (moderate) 12/10/2016  . History of colon cancer 08/12/2016  . Mass of left breast on mammogram 12/10/2015  . MDD (major depressive disorder), recurrent severe, without psychosis ([place])  10/19/2015  . HTN (hypertension) 10/18/2015  . DVT (deep venous thrombosis) ([place]) 10/18/2015  . Depression 10/18/2015  . Pain, joint, lower leg, left 01/24/2015  . Numbness and tingling of left arm and leg 01/24/2015  . Aftercare following surgery of the circulatory system, [place] 07/19/2014  . PVD (peripheral vascular disease) with claudication ([place]) 03/23/2014  . Swelling of limb-Left leg 03/23/2014  . Weakness-Left arm/Leg 03/23/2014  . PAD (peripheral artery disease) ([place]) 12/14/2013  . CKD (chronic kidney disease) stage 4, GFR 15-29 ml/min (HCC) 11/15/2013  . Chronic diastolic heart failure ([place]) 11/15/2013  . Unspecified essential hypertension 11/15/2013  . Pulmonary embolism ([place]) 11/14/2013  . Pain in limb 07/30/2013  . Atherosclerosis of native arteries of the extremities with ulceration(440.23) 07/30/2013  . Cirrhosis ([place]) 05/21/2012  . Leucocytosis 04/28/2012  . Colon cancer ([place]) 04/28/2012    Past Surgical History:  Procedure Laterality Date  . ABDOMINAL AORTAGRAM N/A 07/26/2013   Procedure: ABDOMINAL Maxcine Ham;  Surgeon: [name] [place], MD;  Location: [facility] CATH LAB;  Service: Cardiovascular;  Laterality: N/A;  . ABDOMINAL HYSTERECTOMY    . CESAREAN SECTION     X 3 [id number]  . CHOLECYSTECTOMY    . CHOLECYSTECTOMY  1980/s  . COLECTOMY  2010  . DIVERTICULOSIS SURGERY-2002  2012  . FEMORAL-POPLITEAL BYPASS GRAFT Left 08/13/2013   Procedure: BYPASS GRAFT FEMORAL-POPLITEAL  ARTERY WITH NON-REVERSED SAPHANEOUS VEIN; ULTRASOUND GUIDED;  Surgeon: Mal [name], MD;  Location: [facility];  Service: Vascular;  Laterality: Left;  . LOWER EXTREMITY ANGIOGRAM Left 07/26/2013   Procedure: LOWER EXTREMITY ANGIOGRAM;  Surgeon: [name] [place], MD;  Location[facility] C[facility] LAB;  Service: Cardiovascular;  Laterality: Left;    OB History    No data available       Home Medications    Prior to Admission medications   Medication Sig Start Date End Date Taking? Authorizing Provider  albuterol  (PROVENTIL HFA;VENTOLIN HFA) 108 (90 BASE) MCG/ACT inhaler Inhale 2 puffs into the lungs every 6 (six) hours as needed for wheezing or shortness of breath.    Historical Provider, MD  amLODipine (NORVASC) 10 MG tablet Take 10 mg by mouth daily.    Historical Provider, MD  apixaban (ELIQUIS) 2.5 MG TABS tablet Take 1 tablet (2.5 mg total) by mouth 2 (two) times daily. 02/19/17   Truitt Merle, MD  BREO ELLIPTA 100-25 MCG/INH AEPB Inhale 1 puff into the lungs daily. 11/06/16   Historical Provider, MD  cetirizine (ZYRTEC) 10 MG tablet TK 1 T PO QD 05/17/16   Historical Provider, MD  Glycopyrrolate-Formoterol (BEVESPI AEROSPHERE) 9-4.8 MCG/ACT AERO Inhale 1 puff into the lungs 2 (two) times daily.    Historical Provider, MD  labetalol (NORMODYNE) 200 MG tablet  07/25/16   Historical Provider, MD  mometasone (NASONEX) 50 MCG/ACT nasal spray Place 2 sprays into the nose daily. 07/25/16   Historical Provider, MD    Family History Family History  Problem Relation Age of Onset  . Cancer Mother 19    breast and bone  . Cancer Father 52    prostate  . Hypertension Sister   . Bleeding Disorder Sister   . Cancer Cousin 20    breast cancer   . Hypertension Daughter     Social History Social History  Substance Use Topics  . Smoking status: Former Smoker    Packs/day: 1.50    Years: 40.00    Types: Cigarettes    Quit date: 08/09/2014  . Smokeless tobacco: Current User  . Alcohol use No     Comment: she used to drink alcohol, quit in 2010      Allergies   Iohexol   Review of Systems Review of Systems  HENT: Positive for congestion. Negative for sore throat.   Respiratory: Positive for cough and shortness of breath. Negative for wheezing.   Cardiovascular: Positive for chest pain and palpitations. Negative for leg swelling.  Gastrointestinal: Positive for nausea.  Musculoskeletal: Negative for myalgias.     Physical Exam Updated Vital Signs BP 125/75   Pulse 71   Temp 98 F (36.7 C)    Resp 20   Ht 5\' 3"  (1.6 m)   Wt 108.9 kg   SpO2 98%   BMI 42.51 kg/m   Physical Exam  Constitutional: She appears well-developed and well-nourished. No distress.  HENT:  Head: Normocephalic and atraumatic.  Eyes: Conjunctivae are normal. No scleral icterus.  Neck: Normal range of motion.  Cardiovascular: Normal rate and regular rhythm.   No murmur heard. No lower extremity edema calf tenderness without erythema or swelling   Pulmonary/Chest: Effort normal. No respiratory distress. She has no wheezes. She has no rales. She exhibits no tenderness.  Speaking in full sentences  Abdominal: Soft. There is no tenderness.  Neurological: She is alert.  Skin: Skin is warm and dry. She is not diaphoretic.  Psychiatric: Her mood  appears anxious.    ED Treatments / Results  Labs (all labs ordered are listed, but only abnormal results are displayed) Labs Reviewed  BASIC METABOLIC PANEL - Abnormal; Notable for the following:       Result Value   Chloride 113 (*)    CO2 18 (*)    Glucose, Bld 104 (*)    BUN 45 (*)    Creatinine, Ser 3.81 (*)    Calcium 8.5 (*)    GFR calc non Af Amer 12 (*)    GFR calc Af Amer 14 (*)    All other components within normal limits  CBC - Abnormal; Notable for the following:    WBC 12.2 (*)    Hemoglobin 10.3 (*)    HCT 33.4 (*)    MCH 25.8 (*)    All other components within normal limits  I-STAT TROPOININ, ED    EKG  EKG Interpretation None       Radiology Dg Chest 2 View  Result Date: 03/02/2017 CLINICAL DATA:  Acute onset of left anterior chest pain, radiating to the left neck and arm. Exertional shortness of breath. Initial encounter. EXAM: CHEST  2 VIEW COMPARISON:  Chest radiograph performed 01/20/2017 FINDINGS: The lungs are well-aerated. Mild left basilar atelectasis or scarring is noted. Mild chronic vascular congestion is seen. There is no evidence of pleural effusion or pneumothorax. The heart is mildly enlarged. No acute osseous  abnormalities are seen. Clips are noted within the right upper quadrant, reflecting prior cholecystectomy. IMPRESSION: Mild left basilar atelectasis or scarring noted. Mild chronic vascular congestion seen. Mild cardiomegaly. Electronically Signed   By: Garald Balding M.D.   On: 03/02/2017 16:13    Procedures Procedures (including critical care time)  Medications Ordered in ED Medications - No data to display   Initial Impression / Assessment and Plan / ED Course  I have reviewed the triage vital signs and the nursing notes.  Pertinent labs & imaging results that were available during my care of the patient were reviewed by me and considered in my medical decision making (see chart for details).   61 year old woman with PMH unprovoked PE and DVTs, anemia, diastolic dysfunction, COPD not on home oxygen, colon cancer status post resection presents with left sided chest pain which is worse with deep breathing, neck pain, and arm pain. On exam she had SpO2 98% on room air and HR 70s. Presentation was concerning for PE but crt 3.8 worse than baseline 2.6, so CT angio could not be performed. V/Q scan was ordered and she was admitted to internal medicine. Trop I poc was negative.    Final Clinical Impressions(s) / ED Diagnoses   Final diagnoses:  None    New Prescriptions New Prescriptions   No medications on file     Ledell Noss, MD 03/02/17 Sunray Yao, MD 03/02/17 2352

## 2017-03-02 NOTE — ED Triage Notes (Signed)
Patient here with here with 1 week of left anterior CP with radiation to left neck and arm. States that she has had some exertional shortness of breath with same

## 2017-03-03 ENCOUNTER — Observation Stay (HOSPITAL_COMMUNITY): Payer: Medicare Other

## 2017-03-03 ENCOUNTER — Other Ambulatory Visit (HOSPITAL_COMMUNITY): Payer: Self-pay

## 2017-03-03 DIAGNOSIS — N184 Chronic kidney disease, stage 4 (severe): Secondary | ICD-10-CM | POA: Diagnosis not present

## 2017-03-03 DIAGNOSIS — R0600 Dyspnea, unspecified: Secondary | ICD-10-CM | POA: Diagnosis not present

## 2017-03-03 DIAGNOSIS — I2699 Other pulmonary embolism without acute cor pulmonale: Secondary | ICD-10-CM | POA: Diagnosis not present

## 2017-03-03 DIAGNOSIS — I10 Essential (primary) hypertension: Secondary | ICD-10-CM

## 2017-03-03 DIAGNOSIS — R079 Chest pain, unspecified: Secondary | ICD-10-CM

## 2017-03-03 LAB — HIV ANTIBODY (ROUTINE TESTING W REFLEX): HIV Screen 4th Generation wRfx: NONREACTIVE

## 2017-03-03 LAB — URINALYSIS, ROUTINE W REFLEX MICROSCOPIC
Bilirubin Urine: NEGATIVE
Glucose, UA: NEGATIVE mg/dL
Ketones, ur: NEGATIVE mg/dL
Leukocytes, UA: NEGATIVE
Nitrite: NEGATIVE
Protein, ur: >=300 mg/dL — AB
Specific Gravity, Urine: 1.013 (ref 1.005–1.030)
pH: 5 (ref 5.0–8.0)

## 2017-03-03 LAB — BRAIN NATRIURETIC PEPTIDE: B Natriuretic Peptide: 68.4 pg/mL (ref 0.0–100.0)

## 2017-03-03 LAB — TROPONIN I
Troponin I: 0.03 ng/mL (ref <0.03)
Troponin I: <0.03 ng/mL (ref <0.03)
Troponin I: <0.03 ng/mL (ref <0.03)

## 2017-03-03 LAB — CBC
HCT: 33.3 % — ABNORMAL LOW (ref 36.0–46.0)
Hemoglobin: 10.1 g/dL — ABNORMAL LOW (ref 12.0–15.0)
MCH: 25.8 pg — ABNORMAL LOW (ref 26.0–34.0)
MCHC: 30.3 g/dL (ref 30.0–36.0)
MCV: 84.9 fL (ref 78.0–100.0)
Platelets: 253 10*3/uL (ref 150–400)
RBC: 3.92 MIL/uL (ref 3.87–5.11)
RDW: 15.1 % (ref 11.5–15.5)
WBC: 12.9 10*3/uL — ABNORMAL HIGH (ref 4.0–10.5)

## 2017-03-03 LAB — APTT: aPTT: 76 seconds — ABNORMAL HIGH (ref 24–36)

## 2017-03-03 LAB — HEPATIC FUNCTION PANEL
ALT: 25 U/L (ref 14–54)
AST: 20 U/L (ref 15–41)
Albumin: 2.7 g/dL — ABNORMAL LOW (ref 3.5–5.0)
Alkaline Phosphatase: 94 U/L (ref 38–126)
Bilirubin, Direct: 0.1 mg/dL (ref 0.1–0.5)
Indirect Bilirubin: 0.2 mg/dL — ABNORMAL LOW (ref 0.3–0.9)
Total Bilirubin: 0.3 mg/dL (ref 0.3–1.2)
Total Protein: 6.8 g/dL (ref 6.5–8.1)

## 2017-03-03 LAB — HEPARIN LEVEL (UNFRACTIONATED): Heparin Unfractionated: 1.2 IU/mL — ABNORMAL HIGH (ref 0.30–0.70)

## 2017-03-03 LAB — LIPASE, BLOOD: Lipase: 25 U/L (ref 11–51)

## 2017-03-03 IMAGING — NM NM PULMONARY VENT & PERF
16 series · 16 of 16 positions shown · non-contrast
Comparison: Chest radiograph from one day prior. [DATE] V/Q
scan.

CLINICAL DATA: Dyspnea. History of pulmonary embolism and DVT.
Inpatient on heparin.

EXAM:
NUCLEAR MEDICINE VENTILATION - PERFUSION LUNG SCAN
TECHNIQUE: Ventilation images were obtained in multiple projections using
inhaled aerosol [HX] DTPA. Perfusion images were obtained in
multiple projections after intravenous injection of [HX] MAA.
RADIOPHARMACEUTICALS:  31.0 mCi [HX] DTPA aerosol
inhalation and 4.2 mCi [HX] MAA IV

[Series 1: ant/post vent · 4.14mm/px · 1 of 1 slices shown (1 of 2)]
[im 1/1]
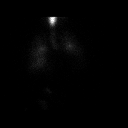

[Series 1: ant/post vent · 4.14mm/px · 1 of 1 slices shown (2 of 2)]
[im 1/1]
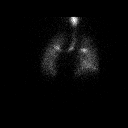

[Series 2: lao/rpo vent · 4.14mm/px · 1 of 1 slices shown (1 of 2)]
[im 1/1]
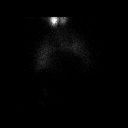

[Series 2: lao/rpo vent · 4.14mm/px · 1 of 1 slices shown (2 of 2)]
[im 1/1  full-range]
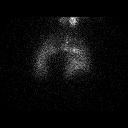

[Series 3: lpo/rao vent · 4.14mm/px · 1 of 1 slices shown (1 of 2)]
[im 1/1  full-range]
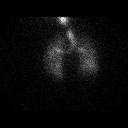

[Series 3: lpo/rao vent · 4.14mm/px · 1 of 1 slices shown (2 of 2)]
[im 1/1]
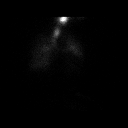

[Series 4: lt lat/rt lat vent · 4.14mm/px · 1 of 1 slices shown (1 of 2)]
[im 1/1]
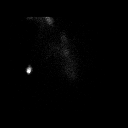

[Series 4: lt lat/rt lat vent · 4.14mm/px · 1 of 1 slices shown (2 of 2)]
[im 1/1  full-range]
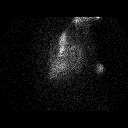

[Series 5: lt lat/rt lat perf · 4.14mm/px · 1 of 1 slices shown (1 of 2)]
[im 1/1]
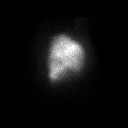

[Series 5: lt lat/rt lat perf · 4.14mm/px · 1 of 1 slices shown (2 of 2)]
[im 1/1]
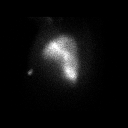

[Series 6: lpo/rao perf · 4.14mm/px · 1 of 1 slices shown (1 of 2)]
[im 1/1]
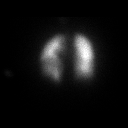

[Series 6: lpo/rao perf · 4.14mm/px · 1 of 1 slices shown (2 of 2)]
[im 1/1]
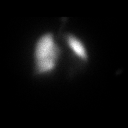

[Series 7: ant/post perf · 4.14mm/px · 1 of 1 slices shown (1 of 2)]
[im 1/1]
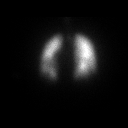

[Series 7: ant/post perf · 4.14mm/px · 1 of 1 slices shown (2 of 2)]
[im 1/1]
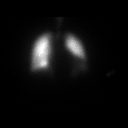

[Series 8: lao/rpo perf · 4.14mm/px · 1 of 1 slices shown (1 of 2)]
[im 1/1]
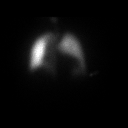

[Series 8: lao/rpo perf · 4.14mm/px · 1 of 1 slices shown (2 of 2)]
[im 1/1]
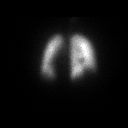

[16 of 16 positions shown; findings below may reference images not displayed]

FINDINGS: Ventilation: No focal ventilation defect. Central airway radiotracer
accumulation limits lung uptake.

Perfusion: No wedge shaped peripheral perfusion defects to suggest
acute pulmonary embolism. Mildly prominent perfusion defect in the
lower central chest corresponding with cardiomegaly. Small defect at
the right lung base correlating with mildly elevated right
hemidiaphragm.
IMPRESSION: Very low probability (0-9%) for pulmonary embolism.

## 2017-03-03 MED ORDER — APIXABAN 2.5 MG PO TABS: 2.5000 mg | ORAL_TABLET | Freq: Two times a day (BID) | ORAL | Status: DC

## 2017-03-03 MED ORDER — TECHNETIUM TO 99M ALBUMIN AGGREGATED
4.0000 | Freq: Once | INTRAVENOUS | Status: AC | PRN
  Administered 2017-03-03: 4 via INTRAVENOUS

## 2017-03-03 MED ORDER — APIXABAN 2.5 MG PO TABS
2.5000 mg | ORAL_TABLET | Freq: Two times a day (BID) | ORAL | Status: DC
  Administered 2017-03-03: 2.5 mg via ORAL
  Filled 2017-03-03: qty 1

## 2017-03-03 MED ORDER — APIXABAN 5 MG PO TABS: 5.0000 mg | ORAL_TABLET | Freq: Two times a day (BID) | ORAL | Status: DC

## 2017-03-03 MED ORDER — TECHNETIUM TC 99M DIETHYLENETRIAME-PENTAACETIC ACID: 30.0000 | Freq: Once | INTRAVENOUS | Status: DC | PRN

## 2017-03-03 NOTE — Progress Notes (Signed)
This encounter was created in error - please disregard.

## 2017-03-03 NOTE — Progress Notes (Signed)
ANTICOAGULATION CONSULT NOTE - Follow Up Consult  Pharmacy Consult for Heparin (Apixaban on hold) Indication: DVT and rule out PE  Allergies  Allergen Reactions  . Gadolinium Derivatives Hives and Other (See Comments)    HIVES, Desc: HIVES W/ "DYE" USED FOR 1ST CT SCAN BUT NOT 2ND, NO PREMEDS USED, PT UNCERTAIN OF CIRCUMSTANCES,,?POSSIBLE MRI CONTRAST ALLERGY, ALL STUDIES DONE "SOMEWHERE" IN [place]//A.C., Onset Date: [id number]  . Iohexol Other (See Comments)     Code: HIVES, Desc: HIVES W/ "DYE" USED FOR 1ST CT SCAN BUT NOT 2ND, NO PREMEDS USED, PT UNCERTAIN OF CIRCUMSTANCES,,?POSSIBLE MRI CONTRAST ALLERGY, ALL STUDIES DONE "SOMEWHERE" IN [place]//A.C., Onset Date: [id number]     Patient Measurements: Height: 5\' 3"  (160 cm) Weight: 228 lb 9.6 oz (103.7 kg) IBW/kg (Calculated) : 52.4  Vital Signs: Temp: 97.8 F (36.6 C) (03/05 0227) Temp Source: Oral (03/05 0227) BP: 130/54 (03/05 0227) Pulse Rate: 72 (03/05 0227)  Labs:  Recent Labs  03/02/17 1523 03/02/17 2334 03/03/17 0418  HGB 10.3*  --  10.1*  HCT 33.4*  --  33.3*  PLT 255  --  253  APTT  --   --  76*  HEPARINUNFRC  --   --  1.20*  CREATININE 3.81*  --   --   TROPONINI  --  <0.03 <0.03    Estimated Creatinine Clearance: 17.8 mL/min (by C-G formula based on SCr of 3.81 mg/dL (H)).   Assessment: Pt with hx of multiple VTE episodes, had been taking Apixaban, although had recently got confused about directions and had been only taking daily. On heparin until new PE can be ruled out with VQ. Using aPTT to dose for now given apixaban influence on anti-Xa levels. APTT is therapeutic this AM.   Goal of Therapy:  Heparin level 0.3-0.7 units/ml aPTT 66-102 seconds Monitor platelets by anticoagulation protocol: Yes   Plan:  -Cont heparin 1300 units/hr -1200 aPTT -F/U VQ results  Narda Bonds 03/03/2017,5:54 AM

## 2017-03-03 NOTE — Discharge Summary (Signed)
Physician Discharge Summary  Whitney Walker EZM:[id number] DOB: [birth year] DOA: 03/02/2017  PCP: [name]  Admit date: 03/02/2017 Discharge date: 03/03/2017   Recommendations for Outpatient Follow-Up:   1. Outpatient follow up with Dr. Einar Gip   Discharge Diagnosis:   Principal Problem:   Chest pain Active Problems:   Pulmonary embolism (HCC)   CKD (chronic kidney disease) stage 4, GFR 15-29 ml/min (HCC)   Chronic diastolic heart failure (HCC)   Essential hypertension   History of colon cancer   Discharge disposition:  Home.  Discharge Condition: Improved.  Diet recommendation: Low sodium, heart healthy.  Carbohydrate-modified.  Wound care: None.   History of Present Illness:   Whitney Walker is a 61 y.o. female with history of recurrent DVT, chronic leukocytosis being followed by oncologist, chronic kidney disease stage IV, chronic anemia, COPD presents to the ER because of worsening chest pain. Patient has been having chest pain for last few weeks but last few days has increased in intensity and has moved to the left cerebral chest. Patient states the pain was initially around the neck area. Pain is mostly on deep breathing. Denies any productive cough fever or chills. [name] Apixaban dose was decreased to half last 2 weeks ago due to worsening renal function. Patient states she has been compliant with her medications.   ED Course: Chest x-ray EKG and cardiac markers were unremarkable. Since patient had similar presentation when she had pulmonary embolism last time and patient had recent decrease in her Apixaban dose heparin was started and VQ scan was planned to be done. Note that patient has chronic kidney disease and CAT scan angiogram cannot be done.   Hospital Course by Problem:   Chest pain -CE neg -discussed with Dr. Einar Gip-- to follow up outpatient -V/Q negative -patient chest pain free  HTN -resume home meds  COPD -stable -outpatient follow up  CKD  IV -outpatient follow up      Medical Consultants:    None.   Discharge Exam:   Vitals:   03/03/17 0227 03/03/17 0620  BP: (!) 130/54 (!) 123/55  Pulse: 72 71  Resp: 15 15  Temp: 97.8 F (36.6 C) 98.1 F (36.7 C)   Vitals:   03/02/17 2030 03/02/17 2153 03/03/17 0227 03/03/17 0620  BP: 135/71 135/68 (!) 130/54 (!) 123/55  Pulse: 70 70 72 71  Resp: 15 15 15 15   Temp:  97.8 F (36.6 C) 97.8 F (36.6 C) 98.1 F (36.7 C)  TempSrc:  Oral Oral Oral  SpO2: 95% 95% 94% 94%  Weight:  103.7 kg (228 lb 9.6 oz)    Height:  5\' 3"  (1.6 m)      Gen:  NAD    The results of significant diagnostics from this hospitalization (including imaging, microbiology, ancillary and laboratory) are listed below for reference.     Procedures and Diagnostic Studies:   Dg Chest 2 View  Result Date: 03/02/2017 CLINICAL DATA:  Acute onset of left anterior chest pain, radiating to the left neck and arm. Exertional shortness of breath. Initial encounter. EXAM: CHEST  2 VIEW COMPARISON:  Chest radiograph performed 01/20/2017 FINDINGS: The lungs are well-aerated. Mild left basilar atelectasis or scarring is noted. Mild chronic vascular congestion is seen. There is no evidence of pleural effusion or pneumothorax. The heart is mildly enlarged. No acute osseous abnormalities are seen. Clips are noted within the right upper quadrant, reflecting prior cholecystectomy. IMPRESSION: Mild left basilar atelectasis or scarring noted. Mild chronic vascular congestion seen. Mild  cardiomegaly. Electronically Signed   By: Garald Balding M.D.   On: 03/02/2017 16:13   Nm Pulmonary Perf And Vent  Result Date: 03/03/2017 CLINICAL DATA:  Dyspnea. History of pulmonary embolism and DVT. Inpatient on heparin. EXAM: NUCLEAR MEDICINE VENTILATION - PERFUSION LUNG SCAN TECHNIQUE: Ventilation images were obtained in multiple projections using inhaled aerosol Tc-80m DTPA. Perfusion images were obtained in multiple projections after  intravenous injection of Tc-91m MAA. RADIOPHARMACEUTICALS:  31.0 mCi Technetium-41m DTPA aerosol inhalation and 4.2 mCi Technetium-65m MAA IV COMPARISON:  Chest radiograph from one day prior. 10/17/2015 V/Q scan. FINDINGS: Ventilation: No focal ventilation defect. Central airway radiotracer accumulation limits lung uptake. Perfusion: No wedge shaped peripheral perfusion defects to suggest acute pulmonary embolism. Mildly prominent perfusion defect in the lower central chest corresponding with cardiomegaly. Small defect at the right lung base correlating with mildly elevated right hemidiaphragm. IMPRESSION: Very low probability (0-9%) for pulmonary embolism. Electronically Signed   By: Ilona Sorrel M.D.   On: 03/03/2017 08:25   Dg Abd Portable 1v  Result Date: 03/02/2017 CLINICAL DATA:  Nausea and vomiting tonight. EXAM: PORTABLE ABDOMEN - 1 VIEW COMPARISON:  10/18/2015 FINDINGS: The bowel gas pattern is normal. No radio-opaque calculi or other significant radiographic abnormality are seen. Cholecystectomy clips are visible in the right upper quadrant. IMPRESSION: Negative. Electronically Signed   By: Andreas Newport M.D.   On: 03/02/2017 23:59     Labs:   Basic Metabolic Panel:  Recent Labs Lab 03/02/17 1523  NA 138  K 4.5  CL 113*  CO2 18*  GLUCOSE 104*  BUN 45*  CREATININE 3.81*  CALCIUM 8.5*   GFR Estimated Creatinine Clearance: 17.8 mL/min (by C-G formula based on SCr of 3.81 mg/dL (H)). Liver Function Tests:  Recent Labs Lab 03/02/17 2334  AST 20  ALT 25  ALKPHOS 94  BILITOT 0.3  PROT 6.8  ALBUMIN 2.7*    Recent Labs Lab 03/02/17 2334  LIPASE 25   No results for input(s): AMMONIA in the last 168 hours. Coagulation profile No results for input(s): INR, PROTIME in the last 168 hours.  CBC:  Recent Labs Lab 03/02/17 1523 03/03/17 0418  WBC 12.2* 12.9*  HGB 10.3* 10.1*  HCT 33.4* 33.3*  MCV 83.5 84.9  PLT 255 253   Cardiac Enzymes:  Recent Labs Lab  03/02/17 2334 03/03/17 0418 03/03/17 1018  TROPONINI <0.03 <0.03 0.03*   BNP: Invalid input(s): POCBNP CBG: No results for input(s): GLUCAP in the last 168 hours. D-Dimer No results for input(s): DDIMER in the last 72 hours. Hgb A1c No results for input(s): HGBA1C in the last 72 hours. Lipid Profile No results for input(s): CHOL, HDL, LDLCALC, TRIG, CHOLHDL, LDLDIRECT in the last 72 hours. Thyroid function studies No results for input(s): TSH, T4TOTAL, T3FREE, THYROIDAB in the last 72 hours.  Invalid input(s): FREET3 Anemia work up No results for input(s): VITAMINB12, FOLATE, FERRITIN, TIBC, IRON, RETICCTPCT in the last 72 hours. Microbiology No results found for this or any previous visit (from the past 240 hour(s)).   Discharge Instructions:   Discharge Instructions    Discharge instructions    Complete by:  As directed    Renal diet Outpatient follow up with Dr. Einar Gip for cardiac work up   Increase activity slowly    Complete by:  As directed      Allergies as of 03/03/2017      Reactions   Gadolinium Derivatives Hives, Other (See Comments)   HIVES, Desc: HIVES W/ "DYE" USED  FOR 1ST CT SCAN BUT NOT 2ND, NO PREMEDS USED, PT UNCERTAIN OF CIRCUMSTANCES,,?POSSIBLE MRI CONTRAST ALLERGY, ALL STUDIES DONE "SOMEWHERE" IN [place]//A.C., Onset Date: [id number]   Iohexol Other (See Comments)    Code: HIVES, Desc: HIVES W/ "DYE" USED FOR 1ST CT SCAN BUT NOT 2ND, NO PREMEDS USED, PT UNCERTAIN OF CIRCUMSTANCES,,?POSSIBLE MRI CONTRAST ALLERGY, ALL STUDIES DONE "SOMEWHERE" IN [place]//A.C., Onset Date: [id number]      Medication List    TAKE these medications   acetaminophen 500 MG tablet Commonly known as:  TYLENOL Take 500 mg by mouth every 6 (six) hours as needed.   albuterol 108 (90 Base) MCG/ACT inhaler Commonly known as:  PROVENTIL HFA;VENTOLIN HFA Inhale 2 puffs into the lungs every 6 (six) hours as needed for wheezing or shortness of breath.   amLODipine 10 MG  tablet Commonly known as:  NORVASC Take 10 mg by mouth daily.   apixaban 2.5 MG Tabs tablet Commonly known as:  ELIQUIS Take 1 tablet (2.5 mg total) by mouth 2 (two) times daily.   BREO ELLIPTA 100-25 MCG/INH Aepb Generic drug:  fluticasone furoate-vilanterol Inhale 1 puff into the lungs daily.   cetirizine 10 MG tablet Commonly known as:  ZYRTEC TK 1 T PO QD   Fluticasone-Salmeterol 100-50 MCG/DOSE Aepb Commonly known as:  ADVAIR Inhale 1 puff into the lungs every evening.   labetalol 200 MG tablet Commonly known as:  NORMODYNE Take 200 mg by mouth 2 (two) times daily.   mometasone 50 MCG/ACT nasal spray Commonly known as:  NASONEX Place 2 sprays into the nose daily.      Follow-up Information    [name] Follow up in 1 week(s).   Specialty:  General Practice Contact information: [address] [place] [address] [phone]        [name], MD Follow up in 1 week(s).   Specialty:  Cardiology Contact information: [address] [place] [address] [phone]            Time coordinating discharge: 25 min  Signed:  Geradine Girt   [facility] 03/03/2017, 12:44 PM

## 2017-03-03 NOTE — Discharge Instructions (Signed)
Information on my medicine - ELIQUIS (apixaban)  This medication education was reviewed with me or my healthcare representative as part of my discharge preparation.  The pharmacist that spoke with me during my hospital stay was:  Arty Baumgartner, [facility]  Why was Eliquis prescribed for you? Eliquis was prescribed to try to reduce the risk of blood clots recurring in the veins of your legs (deep vein thrombosis) or in your lungs (pulmonary embolism).  What do You need to know about Eliquis ? Your Eliquis dose is ONE (1) 2.5 mg tablet taken TWICE daily.  Eliquis may be taken with or without food.   Try to take the dose about the same time in the morning and in the evening. If you have difficulty swallowing the tablet whole please discuss with your pharmacist how to take the medication safely.  Take Eliquis exactly as prescribed and DO NOT stop taking Eliquis without talking to the doctor who prescribed the medication.  Stopping may increase your risk of developing a new blood clot.  Refill your prescription before you run out.  After discharge, you should have regular check-up appointments with your healthcare provider that is prescribing your Eliquis.    What do you do if you miss a dose? If a dose of ELIQUIS is not taken at the scheduled time, take it as soon as possible on the same day and twice-daily administration should be resumed. The dose should not be doubled to make up for a missed dose.  Important Safety Information A possible side effect of Eliquis is bleeding. You should call your healthcare provider right away if you experience any of the following: ? Bleeding from an injury or your nose that does not stop. ? Unusual colored urine (red or dark brown) or unusual colored stools (red or black). ? Unusual bruising for unknown reasons. ? A serious fall or if you hit your head (even if there is no bleeding).  Some medicines may interact with Eliquis and might increase  your risk of bleeding or clotting while on Eliquis. To help avoid this, consult your healthcare provider or pharmacist prior to using any new prescription or non-prescription medications, including herbals, vitamins, non-steroidal anti-inflammatory drugs (NSAIDs) and supplements.  This website has more information on Eliquis (apixaban): http://[email or web address].com/eliquis/home

## 2017-03-03 NOTE — Progress Notes (Signed)
ANTICOAGULATION CONSULT NOTE - Follow Up Consult  Pharmacy Consult for Heparin -> Eliquis Indication: hx DVT and PE  Allergies  Allergen Reactions  . Gadolinium Derivatives Hives and Other (See Comments)    HIVES, Desc: HIVES W/ "DYE" USED FOR 1ST CT SCAN BUT NOT 2ND, NO PREMEDS USED, PT UNCERTAIN OF CIRCUMSTANCES,,?POSSIBLE MRI CONTRAST ALLERGY, ALL STUDIES DONE "SOMEWHERE" IN [place]//A.C., Onset Date: [id number]  . Iohexol Other (See Comments)     Code: HIVES, Desc: HIVES W/ "DYE" USED FOR 1ST CT SCAN BUT NOT 2ND, NO PREMEDS USED, PT UNCERTAIN OF CIRCUMSTANCES,,?POSSIBLE MRI CONTRAST ALLERGY, ALL STUDIES DONE "SOMEWHERE" IN [place]//A.C., Onset Date: [id number]     Patient Measurements: Height: 5\' 3"  (160 cm) Weight: 228 lb 9.6 oz (103.7 kg) IBW/kg (Calculated) : 52.4 Heparin Dosing Weight: 78 kg  Vital Signs: Temp: 98.1 F (36.7 C) (03/05 0620) Temp Source: Oral (03/05 0620) BP: 123/55 (03/05 0620) Pulse Rate: 71 (03/05 0620)  Labs:  Recent Labs  03/02/17 1523 03/02/17 2334 03/03/17 0418  HGB 10.3*  --  10.1*  HCT 33.4*  --  33.3*  PLT 255  --  253  APTT  --   --  76*  HEPARINUNFRC  --   --  1.20*  CREATININE 3.81*  --   --   TROPONINI  --  <0.03 <0.03    Estimated Creatinine Clearance: 17.8 mL/min (by C-G formula based on SCr of 3.81 mg/dL (H)).  Assessment:   61 yr old female with hx recurrent PE and DVT, last in October 2016.   VQ this morning showed low risk of PE; transitioned back to Eliquis 2.5 mg BID.   Patient denies recent confusion over directions to take BID.    Of note, increased serum creatinine alone does not necessitate dose reduction of Eliquis. Dose is usually reduced from 5 mg BID if Scr >1.57 AND > 22 years old or < 60 kg for afib.  However, dose for recurrent DVT and PE prevention is 2.5 mg BID. Discussed with Dr. Eliseo Squires.  Goal of Therapy:  Heparin level 0.3-0.7 units/ml aPTT 66-102 seconds Eliquis dosing for indication Monitor  platelets by anticoagulation protocol: Yes   Plan:   Continue Eliquis 2.5 mg BID.  Discussed with patient.  Arty Baumgartner, [place] Pager: [phone] 03/03/2017,11:00 AM

## 2017-03-12 ENCOUNTER — Encounter: Payer: Self-pay | Admitting: Internal Medicine

## 2017-03-12 ENCOUNTER — Ambulatory Visit (INDEPENDENT_AMBULATORY_CARE_PROVIDER_SITE_OTHER): Payer: Medicare Other | Admitting: Internal Medicine

## 2017-03-12 VITALS — BP 140/80 | HR 72 | Ht 63.0 in | Wt 228.8 lb

## 2017-03-12 DIAGNOSIS — R0609 Other forms of dyspnea: Secondary | ICD-10-CM | POA: Diagnosis not present

## 2017-03-12 DIAGNOSIS — R0600 Dyspnea, unspecified: Secondary | ICD-10-CM | POA: Insufficient documentation

## 2017-03-12 DIAGNOSIS — I10 Essential (primary) hypertension: Secondary | ICD-10-CM

## 2017-03-12 MED ORDER — NEBIVOLOL HCL 20 MG PO TABS: 20.0000 mg | ORAL_TABLET | Freq: Every day | ORAL | 0 refills | Status: DC

## 2017-03-12 MED ORDER — NEBIVOLOL HCL 10 MG PO TABS: 10.0000 mg | ORAL_TABLET | Freq: Every day | ORAL | 0 refills | Status: DC

## 2017-03-12 NOTE — Assessment & Plan Note (Addendum)
03/12/2017  Walked RA x 3 laps @ 185 ft each stopped due to  End of study, fast pace, sob at end and sats 90%  - Spirometry 03/12/2017  FEV1 1.34 (55%)  Ratio 79 with min curvature on BREO 100  - 03/12/2017  After extensive coaching device effectiveness =   90% baseline 75%    Symptoms are markedly disproportionate to objective findings and not clear this is a lung problem but pt does appear to have difficult airway management issues. DDX of  difficult airways management almost all start with A and  include Adherence, Ace Inhibitors, Acid Reflux, Active Sinus Disease, Alpha 1 Antitripsin deficiency, Anxiety masquerading as Airways dz,  ABPA,  Allergy(esp in young), Aspiration (esp in elderly), Adverse effects of meds,  Active smokers, A bunch of PE's (a small clot burden can't cause this syndrome unless there is already severe underlying pulm or vascular dz with poor reserve) plus two Bs  = Bronchiectasis and Beta blocker use..and one C= CHF  Adherence is always the initial "prime suspect" and is a multilayered concern that requires a "trust but verify" approach in every patient - starting with knowing how to use medications, especially inhalers, correctly, keeping up with refills and understanding the fundamental difference between maintenance and prns vs those medications only taken for a very short course and then stopped and not refilled.  - see teaching on how/ when to take maint breo vs prn saba  ? Allergy/ asthma > Breo should do   ? A bunch of PE's > note neg v/q and on eliquis since   ? Anxiety > usually at the bottom of this list of usual suspects but should be much higher on this pt's based on H and P and note already on psychotropics .   ? Active smoking > pt inconsistent with "when was your last cig" I reviewed the Fletcher curve with the patient that basically indicates  if you quit smoking when your best day FEV1 is still well preserved (as is clearly  the case here)  it is highly  unlikely you will progress to severe disease and informed the patient there was  no medication on the market that has proven to alter the curve/ its downward trajectory  or the likelihood of progression of their disease(unlike other chronic medical conditions such as atheroclerosis where we do think we can change the natural hx with risk reducing meds)    Therefore stopping smoking and maintaining abstinence is the most important aspect of care, not choice of inhalers or for that matter, doctors.    ? BB effect > only way to know for sure is trial off - see hbp   ? CHF > note bnp << 100 at last admit with sob so unlikely    Total time devoted to counseling  > 50 % of initial 60 min office visit:  review case with pt/ discussion of options/alternatives/ personally creating written customized instructions  in presence of pt  then going over those specific  Instructions directly with the pt including how to use all of the meds but in particular covering each new medication in detail and the difference between the maintenance= "automatic" meds and the prns using an action plan format for the latter (If this problem/symptom => do that organization reading Left to right).  Please see AVS from this visit for a full list of these instructions which I personally wrote for this pt and  are unique to this visit.

## 2017-03-12 NOTE — Assessment & Plan Note (Signed)
Changed labetolol to bystolic 1/74/0814 due to concerns with ? Asthma   Strongly prefer in this setting: Bystolic, the most beta -1  selective Beta blocker available in sample form, with bisoprolol the most selective generic choice  on the market.   Try bystoic 20 mg bid and return in 2 weeks for recheck

## 2017-03-12 NOTE — Progress Notes (Signed)
Subjective:    Patient ID: Whitney Walker, female    DOB: 08-Apr-1956,     MRN: [id number]  HPI  61 yobf quit smoking completely 2017 dx with copd in [place] in her late 30's (but did not meet criteria 03/12/2017 ) and worse doe  since quit smoking so referred to pulmonary clinic 03/12/2017 by Glori Bickers s/p admit:  Admit date: 03/02/2017 Discharge date: 03/03/2017   Discharge Diagnosis:   Principal Problem:   Chest pain Active Problems:   Pulmonary embolism (HCC)   CKD (chronic kidney disease) stage 4, GFR 15-29 ml/min (HCC)   Chronic diastolic heart failure (HCC)   Essential hypertension   History of colon cancer   Discharge disposition:  Home.  Discharge Condition: Improved.  Diet recommendation: Low sodium, heart healthy.  Carbohydrate-modified.  Wound care: None.   History of Present Illness:   Whitney Walker a 61 y.o.femalewith history of recurrent DVT, chronic leukocytosis being followed by oncologist, chronic kidney disease stage IV, chronic anemia, COPD presents to the ER because of worsening chest pain. Patient has been having chest pain for last few weeks but last few days has increased in intensity and has moved to the left cerebral chest. Patient states the pain was initially around the neck area. Pain is mostly on deep breathing. Denies any productive cough fever or chills. [name] Apixaban dose was decreased to half last 2 weeks ago due to worsening renal function. Patient states she has been compliant with her medications.  ED Course:Chest x-ray EKG and cardiac markers were unremarkable. Since patient had similar presentation when she had pulmonary embolism last time and patient had recent decrease in her Apixaban dose heparin was started and VQ scan was planned to be done. Note that patient has chronic kidney disease and CAT scan angiogram cannot be done.   Hospital Course by Problem:   Chest pain -CE neg -discussed with Dr. Einar Gip-- to  follow up outpatient -V/Q negative -patient chest pain free  HTN -resume home meds  COPD -stable -outpatient follow up  CKD IV -outpatient follow up    03/12/2017 1st [facility] office visit/ Recardo Linn   Chief Complaint  Patient presents with  . Pulmonary Consult    dx'd with COPD over thirty years ago, referred by Darleen Crocker, SOB with exertion has gotten worse, fatigued easily now  variable doe (never at rest)  x 30 years with best days can take my trash down x one land maybe 50 ft and has to stop after to get back on landing  Day of ov is a little better than usual  Takes Breo around 6 pm daily and rx with labetolol as well as proair respiclick   No obvious day to day or daytime variability or assoc chronic cough or cp or  subjective wheeze or overt sinus or hb symptoms. No unusual exp hx or h/o childhood pna/ asthma or knowledge of premature birth.  Sleeping ok without nocturnal  or early am exacerbation  of respiratory  c/o's or need for noct saba. Also denies any obvious fluctuation of symptoms with weather or environmental changes or other aggravating or alleviating factors except as outlined above   Current Medications, Allergies, Complete Past Medical History, Past Surgical History, Family History, and Social History were reviewed in [employer] record.  ROS  The following are not active complaints unless bolded sore throat, dysphagia, dental problems, itching, sneezing,  nasal congestion or excess/ purulent secretions, ear ache,   fever, chills, sweats,  unintended wt loss, classically pleuritic or exertional cp, hemoptysis,  orthopnea pnd or leg swelling, presyncope, palpitations, abdominal pain, anorexia, nausea, vomiting, diarrhea  or change in bowel or bladder habits, change in stools or urine, dysuria,hematuria,  rash, arthralgias, visual complaints, headache, numbness, weakness or ataxia or problems with walking or coordination,  change in  mood/affect or memory.           Review of Systems  Constitutional: Negative for fever and unexpected weight change.  HENT: Negative for congestion, dental problem, ear pain, nosebleeds, postnasal drip, rhinorrhea, sinus pressure, sore throat and trouble swallowing.   Eyes: Negative for redness and itching.  Respiratory: Positive for chest tightness and shortness of breath. Negative for cough and wheezing.   Cardiovascular: Negative for palpitations and leg swelling.  Gastrointestinal: Negative for nausea and vomiting.  Genitourinary: Negative for dysuria.  Musculoskeletal: Negative for joint swelling.  Skin: Negative for rash.  Neurological: Negative for headaches.  Hematological: Does not bruise/bleed easily.  Psychiatric/Behavioral: Negative for dysphoric mood. The patient is not nervous/anxious.        Objective:   Physical Exam   amb obese bf nad   Wt Readings from Last 3 Encounters:  03/12/17 228 lb 12.8 oz (103.8 kg)  03/02/17 228 lb 9.6 oz (103.7 kg)  01/03/17 235 lb 6.4 oz (106.8 kg)    Vital signs reviewed - Note on arrival 02 sats  95% on RA     HEENT: nl  turbinates bilaterally, and oropharynx. Nl external ear canals without cough reflex - edentulous    NECK :  without JVD/Nodes/TM/ nl carotid upstrokes bilaterally   LUNGS: no acc muscle use,  Nl contour chest which is clear to A and P bilaterally without cough on insp or exp maneuvers   CV:  RRR  no s3 or murmur or increase in P2, and no edema   ABD:  soft and nontender with nl inspiratory excursion in the supine position. No bruits or organomegaly appreciated, bowel sounds nl  MS:  Nl gait/ ext warm without deformities, calf tenderness, cyanosis or clubbing No obvious joint restrictions   SKIN: warm and dry without lesions    NEURO:  alert, approp, nl sensorium with  no motor or cerebellar deficits apparent.      I personally reviewed images and agree with radiology impression as follows:    CXR:  03/02/17 Mild left basilar atelectasis or scarring noted. Mild chronic vascular congestion seen. Mild cardiomegaly.         Assessment & Plan:

## 2017-03-12 NOTE — Patient Instructions (Addendum)
Change BREO to one click each am  Only use your albuterol as a rescue medication to be used if you can't catch your breath by resting or doing a relaxed purse lip breathing pattern.  - The less you use it, the better it will work when you need it. - Ok to use up to 2 puffs  every 4 hours if you must but call for immediate appointment if use goes up over your usual need - Don't leave home without it !!  (think of it like the spare tire for your car)  Stop labetalolol and start bystolic 10 mg twice daily instead  Bystolic 20 mg twice daily    Please schedule a follow up office visit in 2 weeks, sooner if needed to see Tammy NP to recheck your blood pressure  and breathing

## 2017-03-12 NOTE — Assessment & Plan Note (Signed)
Body mass index is 40.53    trending down/ reinforced  No results found for: TSH   Contributing to gerd risk/ doe/reviewed the need and the process to achieve and maintain neg calorie balance > defer f/u primary care including intermittently monitoring thyroid status

## 2017-03-13 ENCOUNTER — Other Ambulatory Visit: Payer: Self-pay | Admitting: Cardiology

## 2017-03-13 DIAGNOSIS — Z86718 Personal history of other venous thrombosis and embolism: Secondary | ICD-10-CM | POA: Diagnosis not present

## 2017-03-13 DIAGNOSIS — I10 Essential (primary) hypertension: Secondary | ICD-10-CM | POA: Diagnosis not present

## 2017-03-13 DIAGNOSIS — R0789 Other chest pain: Secondary | ICD-10-CM | POA: Diagnosis not present

## 2017-03-13 DIAGNOSIS — Z86711 Personal history of pulmonary embolism: Secondary | ICD-10-CM | POA: Diagnosis not present

## 2017-03-19 ENCOUNTER — Telehealth: Payer: Self-pay | Admitting: Neurology

## 2017-03-19 ENCOUNTER — Encounter: Payer: Self-pay | Admitting: Adult Health

## 2017-03-19 ENCOUNTER — Ambulatory Visit (INDEPENDENT_AMBULATORY_CARE_PROVIDER_SITE_OTHER): Payer: Medicare Other | Admitting: Adult Health

## 2017-03-19 VITALS — BP 148/81 | HR 67 | Temp 99.6°F | Ht 63.0 in | Wt 226.6 lb

## 2017-03-19 DIAGNOSIS — G4733 Obstructive sleep apnea (adult) (pediatric): Secondary | ICD-10-CM

## 2017-03-19 DIAGNOSIS — F329 Major depressive disorder, single episode, unspecified: Secondary | ICD-10-CM | POA: Diagnosis not present

## 2017-03-19 DIAGNOSIS — F32A Depression, unspecified: Secondary | ICD-10-CM

## 2017-03-19 DIAGNOSIS — R413 Other amnesia: Secondary | ICD-10-CM | POA: Diagnosis not present

## 2017-03-19 NOTE — Patient Instructions (Signed)
Follow up with PCP regarding depression.  Also get established with Pychiatrist for depression If you have suicidal thoughts you should call 911 or go to the emergency room Consider using Bipap. If your symptoms worsen or you develop new symptoms please let us know.

## 2017-03-19 NOTE — Telephone Encounter (Signed)
Please call patient and make sure she has made appointment with PCP soon and/or [facility]. See Megan's note today, patient was noted to be depressed and tearful, had voiced suicidal thoughts, no intent.

## 2017-03-19 NOTE — Progress Notes (Addendum)
PATIENT: Whitney Walker DOB: 1956-11-17  REASON FOR VISIT: follow up- OSA on bipap, memory disturbance HISTORY FROM: patient  HISTORY OF PRESENT ILLNESS: Today 02/27/2017: Whitney Walker Whitney a 62 year old female with a history of obstructive sleep apnea on BiPAP and memory disturbance. She returns today for follow-up. She states that she Whitney not been using the BiPAP. She states that she wants to use it but she Whitney just been under a lot of stress. The patient Whitney very tearful during our office visit. She states that she does have some depression. She also reports suicidal thoughts and has also thought of ways to carry this out. She then states that she would never do this. She Whitney currently not seeing anyone for her depression. She Whitney not on any medication for depression. The patient does notice some changes with her memory. She lives at home alone. She Whitney able to complete all ADLs independently. She does not operate a motor vehicle. She Whitney able to prepare meals without difficulty. She Whitney able to manage her medications and appointments. She states for her appointment she typically wakes herself posted notes in order not to forget. She just recently followed up with pulmonology as she was in the hospital for shortness of breath.  HISTORY per Dr. Guadelupe Sabin notes: Whitney Walker Whitney a 61 year old right-handed woman with a complex medical history of hypertension, obesity, congestive heart failure, chronic kidney disease, COPD, peripheral vascular disease, DVT and PE in 2014, prior smoking and secondhand smoke exposure, who presents for follow-up consultation of her memory loss and obstructive sleep apnea, with prior non-compliance with BiPAP therapy. The patient Whitney unaccompanied today. I last saw her on 02/05/2016 at which time she was no longer using her BiPAP. She reported a lot of recent stressors. She presented to the emergency room in October 2017 with shortness of breath, she was admitted to the hospital from  10/17/2015 through 10/25/2015, was diagnosed with left leg DVT and PE. She also reported an increase in her depression, for which she was started on Effexor long-acting. She reported that she was taking care of a 72-monthold great-grandson and therefore was not using her BiPAP but [place] She was on Xarelto, then ASA, then coumadin, then Eliquis (per Dr. GEinar Gip. She was worried about her younger sister and her health issues. Patient underwent a recent breast biopsy.  We talked about the importance of medication compliance and treatment compliance with BiPAP. I prescribed mask for her BiPAP machine and a mask refit through her DME.  Today, 07/31/2016: I reviewed her BiPAP compliance data from 02/05/1999 7:15 through 05/05/2016 which Whitney a total of 90 days during which time she used her machine 6 days only, essentially noncompliant with her BiPAP. She reports having had difficulty adjusting to treatment. She was traveling a lot and she just never gotten into the habit of using her BiPAP. She has been traveling a lot, out of state, traveling across states with a friend.   Previously:   I saw her on 10/02/2015, at which time she reported trouble tolerating BiPAP. She felt he was uncomfortable. She also reported stress, her blood pressure was higher as well. She was having headaches. She was not fully compliant with BiPAP therapy. She never started her new diabetes medication she reported. She also said she needed to move. On the positive side, she felt improved and her daytime energy and nocturia after starting BiPAP therapy.   I reviewed her BiPAP compliance data  from 12/04/2015 through 01/02/2016 which Whitney a total of 30 days during which time she used her machine only 2 days, indicating noncompliance.  I first met her on 05/04/2015 at the request of her primary care provider, at which time she reported memory loss for the past 6 months. She reported forgetfulness. She  also reported a prior diagnosis of obstructive sleep apnea and using oxygen in the past and she had discontinued both CPAP treatment and oxygen usage at home. She used to live in [place] and moved to [place] to be closer to her children. She had had recent blood work. Her MMSE was 27 out of 30, clock drawing was 2 out of 4, animal fluency was 10/m. She had vascular risk factors. I suggested she work with her primary care physician to optimize her hypertension, lipid management and she was also supposed to start a new diabetes medication. I suggested we proceed with a brain MRI without contrast and a sleep study. She had a split-night sleep study on 05/22/2015 and I went over her test results with her in detail today. Baseline sleep efficiency was severely reduced at 28.1% with a latency to sleep prolonged at 67 minutes and wake after sleep onset long at 86.5 minutes with severe sleep fragmentation noted. She had a markedly elevated arousal index. She had absence of slow-wave sleep and near absence of REM sleep with less than 1% REM sleep. Due to prolonged REM latency. Moderate snoring was noted. She slept almost exclusively on her back. Total AHI was highly elevated at 77 per hour, baseline oxygen saturation 94%, nadir was 87%. She was then titrated on positive airway pressure. Sleep efficiency was 96.5%, latency to sleep only 2.5 minutes and wake after sleep onset only 6 minutes with mild sleep fragmentation noted. She had 23.9% of slow-wave sleep and an increased percentage of REM sleep at 45.2%. Average oxygen saturation was 93%, nadir was 72% during REM sleep. She was initially started on CPAP at a pressure of 5 cm and gradually increased to 15 cm. Due to residual sleep disordered breathing noted, she was switched to BiPAP therapy starting at 14/10 cm with gradual increase to 19/14 cm. AHI was 7.3 per hour on the final pressure with supine REM sleep achieved. Oxygen desaturation nadir on the final  BiPAP pressure was 90%. Based on her test results I prescribed standard BiPAP therapy for home use at a pressure of 19/14 cm.  She had a brain MRI without contrast on 05/08/2015: This Whitney an abnormal MRI of the brain without contrast showing white matter changes most compatible with small vessel ischemic changes. The extent Whitney more than expected for age.There are no acute findings.  In addition, I personally reviewed the images through the PACS system.  We called her with her test results.  I reviewed her BiPAP compliance data from 08/29/2015 through 09/27/2015 which Whitney a total of 30 days during which time she used her machine 21 days with percent used days greater than 4 hours of only 67%, indicating mildly suboptimal compliance with an average usage of 4 hours and 46 minutes, residual AHI suboptimal at 12.4 per hour, leak acceptable with the 95th percentile at 13.5 L/m on a pressure of 19/14 cm standard BiPAP. Of note, she was compliant with a compliance percentage of 74% when I reviewed her compliance data from 08/09/2015 through 09/27/2015 which Whitney a total of 50 days.  05/04/2015: She reports memory loss in the last 6 months.She reports primarily forgetfulness. She  moved from [place] in 2009 to be closer to her children. She has a son and a daughter. She has grandchildren and great-grandchildren. She lives alone. She Whitney currently not driving as she does not have a car.  I reviewed your office note from 04/05/2015 which you kindly included. Also reviewed laboratory test results from your office from 04/05/2015: Hemoglobin A1c was 6.5, total cholesterol 229, LDL 128, triglycerides 253. Her blood pressure at the time was 160/80. BMI 45.   She has been seen by the vascular and vein specialists [place]. I reviewed their office note from 01/24/2015. She had left femoropopliteal vein graft performed in August 2014. She had a PE in November 2014 and was started on Xarelto. She Whitney now on  ASA. She has seen Dr. Einar Gip in cardiology. She has seen oncology for anemia and leukocytosis. I reviewed the office note from Dr. Burr Medico from 01/11/2015.  She had a head CT without contrast and CT cervical spine without contrast on 10/31/2011: 1. Normal noncontrast CT appearance of the brain. In addition, personally reviewed the images through the PACS system and agree with the findings. 1.Negative neck soft tissues except for left greater than right carotid calcified atherosclerosis. 2.Evidence of pulmonary artery hypertension in the visualized chest (main PA 36 mm in diameter versus ascending aorta 32 mm).  She had a CTH without contrast on 03/09/14: this was reported as negative.I personally reviewed the images through the PACS system and agree with the findings.   She was diagnosed with OSA years ago when she was still in [place], but gave her CPAP machine to her daughter and she reports that both her son and daughter have severe OSA. She had an O2 tank at home, but no longer has it. She has not used CPAP in some years. Her sleep study results are not available for reviewed.   She Whitney not on cholesterol medication. She Whitney supposed to start a diabetes medication, but has not picked up the prescription yet.   She reports snoring, gasping sensations in sleep, morning headaches, nocturia x 3-4 per night.  She denies a family history of dementia. She denies Hallucinations or delusions.   REVIEW OF SYSTEMS: Out of a complete 14 system review of symptoms, the patient complains only of the following symptoms, and all other reviewed systems are negative.  Shortness of breath, appetite change, chills, fatigue, restless leg, insomnia, apnea, depression, suicidal thoughts, memory loss, headache  ALLERGIES: Allergies  Allergen Reactions  . Gadolinium Derivatives Hives and Other (See Comments)    HIVES, Desc: HIVES W/ "DYE" USED FOR 1ST CT SCAN BUT NOT 2ND, NO PREMEDS USED, PT UNCERTAIN  OF CIRCUMSTANCES,,?POSSIBLE MRI CONTRAST ALLERGY, ALL STUDIES DONE "SOMEWHERE" IN [place]//A.C., Onset Date: [id number]  . Iohexol Other (See Comments)     Code: HIVES, Desc: HIVES W/ "DYE" USED FOR 1ST CT SCAN BUT NOT 2ND, NO PREMEDS USED, PT UNCERTAIN OF CIRCUMSTANCES,,?POSSIBLE MRI CONTRAST ALLERGY, ALL STUDIES DONE "SOMEWHERE" IN [place]//A.C., Onset Date: [id number]     HOME MEDICATIONS: Outpatient Medications Prior to Visit  Medication Sig Dispense Refill  . albuterol (PROVENTIL HFA;VENTOLIN HFA) 108 (90 BASE) MCG/ACT inhaler Inhale 2 puffs into the lungs every 6 (six) hours as needed for wheezing or shortness of breath.    [name] amLODipine (NORVASC) 10 MG tablet Take 10 mg by mouth daily.    [name] apixaban (ELIQUIS) 2.5 MG TABS tablet Take 1 tablet (2.5 mg total) by mouth 2 (two) times daily. 60 tablet 0  . BREO ELLIPTA  100-25 MCG/INH AEPB Inhale 1 puff into the lungs daily.  2  . cetirizine (ZYRTEC) 10 MG tablet TK 1 T PO QD  3  . mometasone (NASONEX) 50 MCG/ACT nasal spray Place 2 sprays into the nose daily.    . Nebivolol HCl (BYSTOLIC) 20 MG TABS Take 1 tablet (20 mg total) by mouth daily. 14 tablet 0  . nebivolol (BYSTOLIC) 10 MG tablet Take 1 tablet (10 mg total) by mouth daily. (Patient not taking: Reported on 03/19/2017) 28 tablet 0   No facility-administered medications prior to visit.     PAST MEDICAL HISTORY: Past Medical History:  Diagnosis Date  . Anemia   . Anxiety   . Arthritis   . CHF (congestive heart failure) ([place])   . CKD (chronic kidney disease)    Stage III 03/2013  . Colon cancer ([place])   . COPD (chronic obstructive pulmonary disease) ([place])   . Coronary artery disease   . Depression   . Hypertension   . Meningitis   . PE (pulmonary embolism)   . Peripheral vascular disease ([place])   . Shortness of breath   . Sleep apnea   . Vertigo     PAST SURGICAL HISTORY: Past Surgical History:  Procedure Laterality Date  . ABDOMINAL AORTAGRAM N/A 07/26/2013    Procedure: ABDOMINAL Maxcine Ham;  Surgeon: [name] Slocomb, MD;  [facility] LAB;  Service: Cardiovascular;  Laterality: N/A;  . ABDOMINAL HYSTERECTOMY    . CESAREAN SECTION     X 3 [id number]  . CHOLECYSTECTOMY    . CHOLECYSTECTOMY  1980/s  . COLECTOMY  2010  . DIVERTICULOSIS SURGERY-2002  2012  . FEMORAL-POPLITEAL BYPASS GRAFT Left 08/13/2013   Procedure: BYPASS GRAFT FEMORAL-POPLITEAL ARTERY WITH NON-REVERSED SAPHANEOUS VEIN; ULTRASOUND GUIDED;  Surgeon: Mal [name], MD;  Location: [facility];  Service: Vascular;  Laterality: Left;  . LOWER EXTREMITY ANGIOGRAM Left 07/26/2013   Procedure: LOWER EXTREMITY ANGIOGRAM;  Surgeon: [name] Atlanta, MD;  Location[facility] CATH LAB;  Service: Cardiovascular;  Laterality: Left;    FAMILY HISTORY: Family History  Problem Relation Age of Onset  . Cancer Mother 20    breast and bone  . Cancer Father 69    prostate  . Hypertension Sister   . Bleeding Disorder Sister   . Cancer Cousin 20    breast cancer   . Hypertension Daughter     SOCIAL HISTORY: Social History   Social History  . Marital status: Single    Spouse name: N/A  . Number of children: 4  . Years of education: 12th gd   Occupational History  . N/A    Social History Main Topics  . Smoking status: Former Smoker    Packs/day: 1.50    Years: 40.00    Types: Cigarettes    Quit date: 08/09/2014  . Smokeless tobacco: Current User  . Alcohol use No     Comment: she used to drink alcohol, quit in 2010   . Drug use: No  . Sexual activity: Not on file   Other Topics Concern  . Not on file   Social History Narrative   2 cups of coffee a day       PHYSICAL EXAM  Vitals:   03/19/17 1414  BP: (!) 148/81  Pulse: 67  Temp: 99.6 F (37.6 C)  TempSrc: Oral  Weight: 226 lb 9.6 oz (102.8 kg)  Height: 5' 3" (1.6 m)   Body mass index Whitney 40.14 kg/m.   MMSE -  Mini Mental State Exam 03/19/2017 07/31/2016 02/05/2016  Orientation to time _0 Orientation to Place _1 Registration _2 Attention/ Calculation _3 Recall _4 Language- name 2 objects _5 Language- repeat _6 Language- follow 3 step command _7 Language- read & follow direction _8 Write a sentence _9 Copy design _10 Total score _11 Generalized: Well developed, in no acute distress   Neurological examination  Mentation: Alert oriented to time, place, history taking. Follows all commands speech and language fluent Cranial nerve II-XII: Pupils were equal round reactive to light. Extraocular movements were full, visual field were full on confrontational test. Facial sensation and strength were normal. Uvula tongue midline. Head turning and shoulder shrug  were normal and symmetric. Motor: The motor testing reveals 5 over 5 strength of all 4 extremities. Good symmetric motor tone Whitney noted throughout.  Sensory: Sensory testing Whitney intact to soft touch on all 4 extremities. No evidence of extinction Whitney noted.  Coordination: Cerebellar testing reveals good finger-nose-finger and heel-to-shin bilaterally.  Gait and station: Gait Whitney normal. Tandem gait Whitney normal. Romberg Whitney negative. No drift Whitney seen.  Reflexes: Deep tendon reflexes are symmetric and normal bilaterally.   DIAGNOSTIC DATA (LABS, IMAGING, TESTING) - I reviewed patient records, labs, notes, testing and imaging myself where available.  Lab Results  Component Value Date   WBC 12.9 (H) 03/03/2017   HGB 10.1 (L) 03/03/2017   HCT 33.3 (L) 03/03/2017   MCV 84.9 03/03/2017   PLT 253 03/03/2017      Component Value Date/Time   NA 138 03/02/2017 1523   NA 139 01/03/2017 0929   K 4.5 03/02/2017 1523   K 4.6 01/03/2017 0929   CL 113 (H) 03/02/2017 1523   CL 111 (H) 12/04/2012 1056   CO2 18 (L) 03/02/2017 1523   CO2 14 (L) 01/03/2017 0929   GLUCOSE 104 (H) 03/02/2017 1523   GLUCOSE 151 (H) 01/03/2017 0929   GLUCOSE 97 12/04/2012 1056   BUN 45 (H) 03/02/2017 1523   BUN 47.5 (H) 01/03/2017  0929   CREATININE 3.81 (H) 03/02/2017 1523   CREATININE 3.4 Repeated and Verified (HH) 01/03/2017 0929   CALCIUM 8.5 (L) 03/02/2017 1523   CALCIUM 8.6 01/03/2017 0929   PROT 6.8 03/02/2017 2334   PROT 7.1 01/03/2017 0929   ALBUMIN 2.7 (L) 03/02/2017 2334   ALBUMIN 2.7 (L) 01/03/2017 0929   AST 20 03/02/2017 2334   AST 19 01/03/2017 0929   ALT 25 03/02/2017 2334   ALT 20 01/03/2017 0929   ALKPHOS 94 03/02/2017 2334   ALKPHOS 144 01/03/2017 0929   BILITOT 0.3 03/02/2017 2334   BILITOT <0.22 01/03/2017 0929   GFRNONAA 12 (L) 03/02/2017 1523   GFRAA 14 (L) 03/02/2017 1523   Lab Results  Component Value Date   CHOL  09/08/2009    192        ATP III CLASSIFICATION:  <200     mg/dL   Desirable  200-239  mg/dL   Borderline High  >=240    mg/dL   High          HDL 34 (L) 09/08/2009   LDLCALC (H) 09/08/2009    127        Total Cholesterol/HDL:CHD Risk Coronary Heart Disease Risk Table  Men   Women  1/2 Average Risk   3.4   3.3  Average Risk       5.0   4.4  2 X Average Risk   9.6   7.1  3 X Average Risk  23.4   11.0        Use the calculated Patient Ratio above and the CHD Risk Table to determine the patient's CHD Risk.        ATP III CLASSIFICATION (LDL):  <100     mg/dL   Optimal  100-129  mg/dL   Near or Above                    Optimal  130-159  mg/dL   Borderline  160-189  mg/dL   High  >190     mg/dL   Very High   TRIG 154 (H) 09/08/2009   CHOLHDL 5.6 09/08/2009      ASSESSMENT AND PLAN 61 y.o. year old female  has a past medical history of Anemia; Anxiety; Arthritis; CHF (congestive heart failure) ([place]); CKD (chronic kidney disease); Colon cancer ([place]); COPD (chronic obstructive pulmonary disease) ([place]); Coronary artery disease; Depression; Hypertension; Meningitis; PE (pulmonary embolism); Peripheral vascular disease ([place]); Shortness of breath; Sleep apnea; and Vertigo. here with:  1. Obstructive sleep apnea 2. Depression 3. Memory  disturbance  I discussed with the patient the risk associated with untreated sleep apnea. She verbalized understanding. She states that she knows that she needs to use the BiPAP. She would like to continue to try this. The patient also reports depression with thoughts of suicide. I advised the patient that she should go to [facility] emergency room however she declined. She states that she would never followed through with suicide. She will try to make a sooner point with her PCP to discuss depression. Also advised that if she Whitney unable to get a sooner appointment she can visit [facility]. She verbalized understanding. Also advised the patient that if she has thoughts of carrying out suicide she should go to the emergency room. She voiced understanding. The patient's memory score has slightly declined since her visit 6 months ago. I feel this may be multifactorial related to untreated depression and sleep apnea. We will continue to monitor. She will follow-up in 6 months or sooner if needed.    Ward Givens, MSN, NP-C 03/19/2017, 2:52 PM [facility] [address] [place], [place] [address] [phone]  I reviewed the above note and documentation by the Nurse Practitioner and agree with the history, physical exam, assessment and plan as outlined above. I was immediately available for face-to-face consultation. We will give her a call to make sure, she makes appt with PCP and/or goes to [facility].   Star Age, MD, PhD [facility])

## 2017-03-19 NOTE — Telephone Encounter (Signed)
I spoke to patient, she states that she has to call PCP in the morning to get her appt moved up to a sooner date. I asked her if I can call her back tomorrow afternoon. Patient voiced that was ok. I will call back tomorrow.

## 2017-03-20 NOTE — Telephone Encounter (Signed)
I spoke to patient. She states that she is doing better today. She was able to contact PCP, and move appt up to 4/16.   We talked about [facility] further. She has their contact information and will call them. She states that she is just over whelmed with her medications and fearful that they will harm her kidneys further. I voiced understanding. Patient stated that she was thankful for the call and will call us back if needed.

## 2017-03-25 ENCOUNTER — Other Ambulatory Visit: Payer: Self-pay | Admitting: Hematology

## 2017-03-25 DIAGNOSIS — N183 Chronic kidney disease, stage 3 (moderate): Secondary | ICD-10-CM | POA: Diagnosis not present

## 2017-03-25 DIAGNOSIS — E785 Hyperlipidemia, unspecified: Secondary | ICD-10-CM | POA: Diagnosis not present

## 2017-03-25 DIAGNOSIS — D631 Anemia in chronic kidney disease: Secondary | ICD-10-CM | POA: Diagnosis not present

## 2017-03-25 DIAGNOSIS — E669 Obesity, unspecified: Secondary | ICD-10-CM | POA: Diagnosis not present

## 2017-03-25 DIAGNOSIS — Z720 Tobacco use: Secondary | ICD-10-CM | POA: Diagnosis not present

## 2017-03-26 DIAGNOSIS — R0602 Shortness of breath: Secondary | ICD-10-CM | POA: Diagnosis not present

## 2017-03-26 DIAGNOSIS — R0789 Other chest pain: Secondary | ICD-10-CM | POA: Diagnosis not present

## 2017-04-02 ENCOUNTER — Ambulatory Visit: Payer: Self-pay | Admitting: Adult Health

## 2017-04-09 DIAGNOSIS — R0789 Other chest pain: Secondary | ICD-10-CM | POA: Diagnosis not present

## 2017-04-09 DIAGNOSIS — R0609 Other forms of dyspnea: Secondary | ICD-10-CM | POA: Diagnosis not present

## 2017-04-09 DIAGNOSIS — Z86711 Personal history of pulmonary embolism: Secondary | ICD-10-CM | POA: Diagnosis not present

## 2017-04-09 DIAGNOSIS — R0989 Other specified symptoms and signs involving the circulatory and respiratory systems: Secondary | ICD-10-CM | POA: Diagnosis not present

## 2017-04-27 ENCOUNTER — Other Ambulatory Visit: Payer: Self-pay | Admitting: Hematology

## 2017-04-28 ENCOUNTER — Telehealth: Payer: Self-pay | Admitting: *Deleted

## 2017-04-28 MED ORDER — APIXABAN 2.5 MG PO TABS: 2.5000 mg | ORAL_TABLET | Freq: Two times a day (BID) | ORAL | 2 refills | Status: DC

## 2017-04-28 NOTE — Telephone Encounter (Signed)
"  I called my pharmacy for Eliquis refill and they have not received the fax refill request.  Could someone call order to my pharmacy.  I go through this every time."   This nurse sent refill eRx as requested.  Next scheduled F/U is 07-07-2017.

## 2017-05-09 IMAGING — US US RENAL
1 series · 14 of 25 positions shown · non-contrast
Comparison: CT abdomen pelvis of [DATE]

CLINICAL DATA: History of renal failure, also history of carcinoma
of the colon with partial colectomy

EXAM:
RENAL / URINARY TRACT ULTRASOUND COMPLETE

[Series 1: us renal · 0.23mm/px · 14 of 31 slices shown]
[im 1/31]
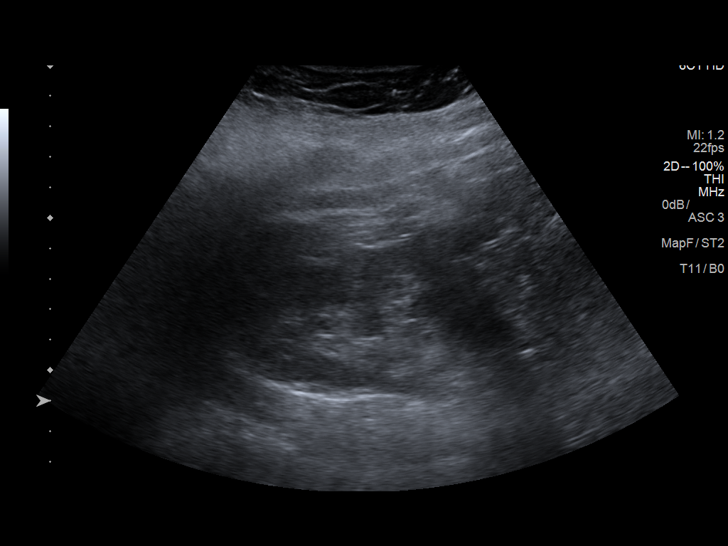
[im 3/31]
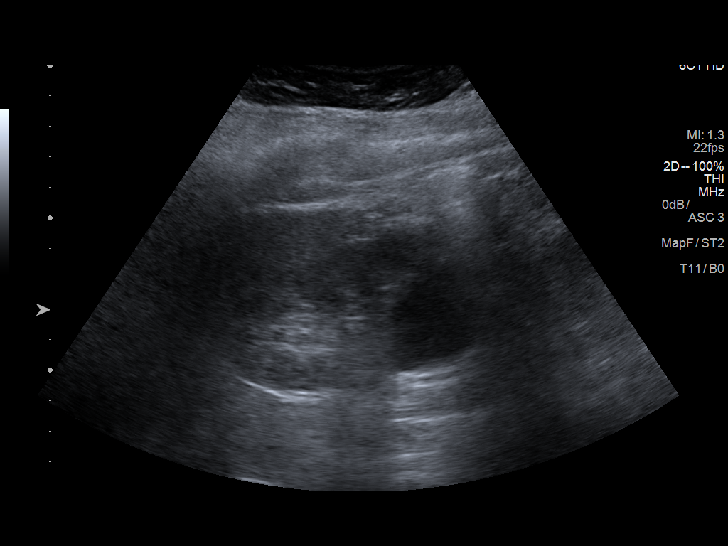
[im 6/31]
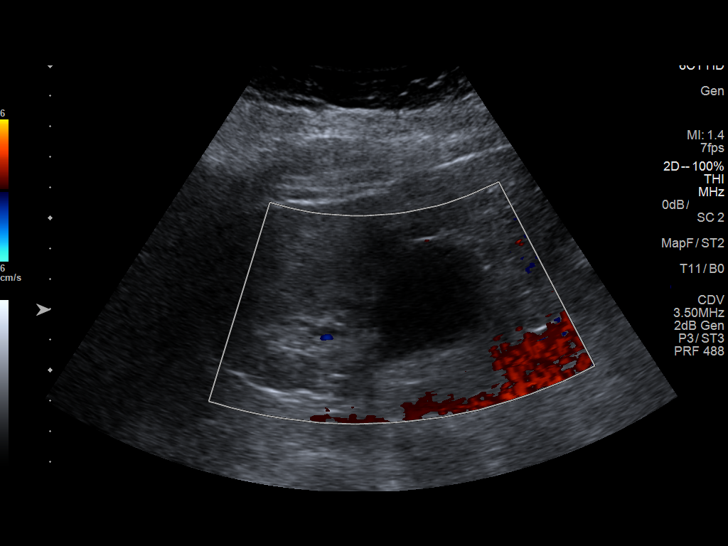
[im 8/31]
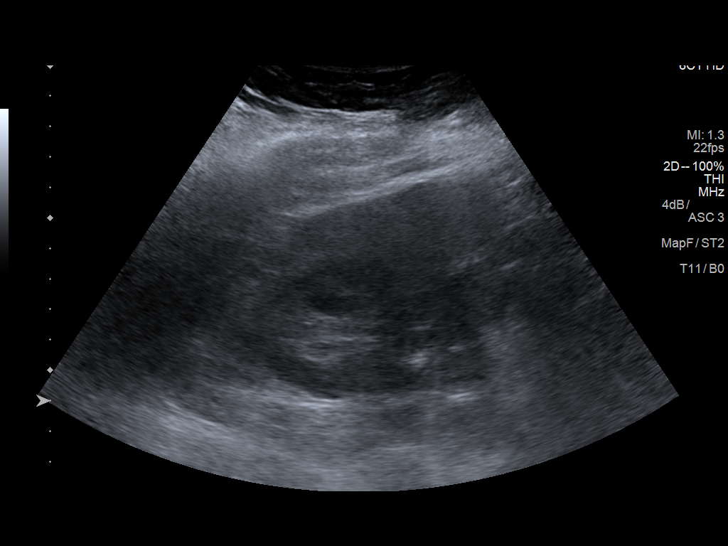
[im 11/31]
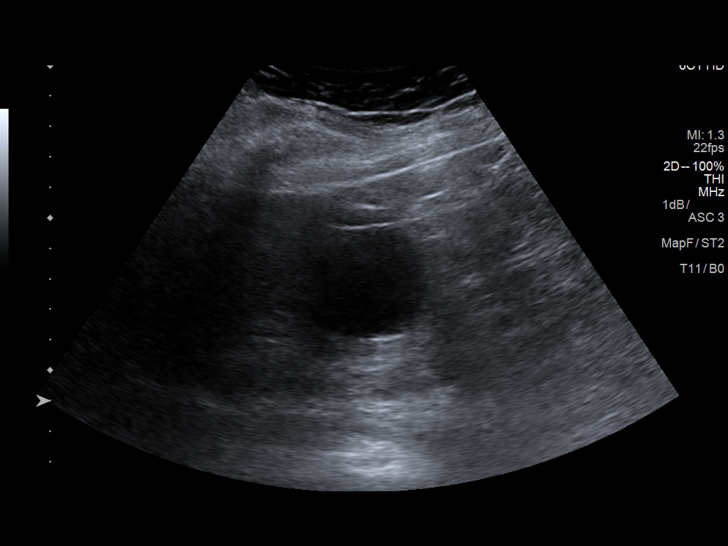
[im 12/31]
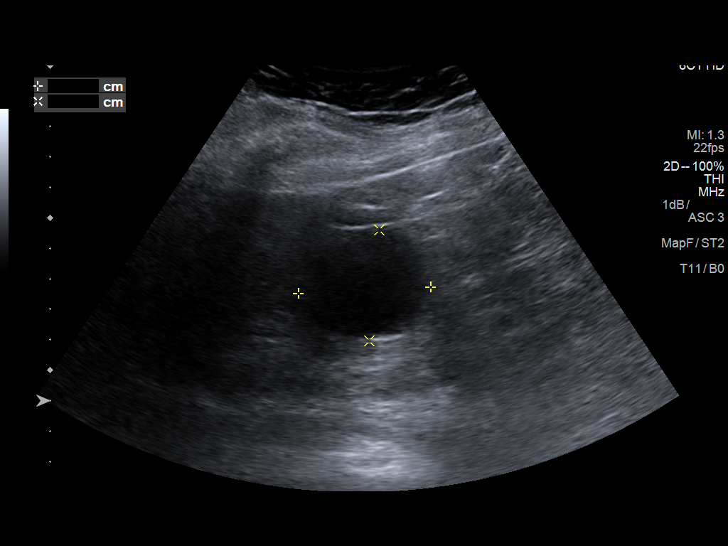
[im 14/31]
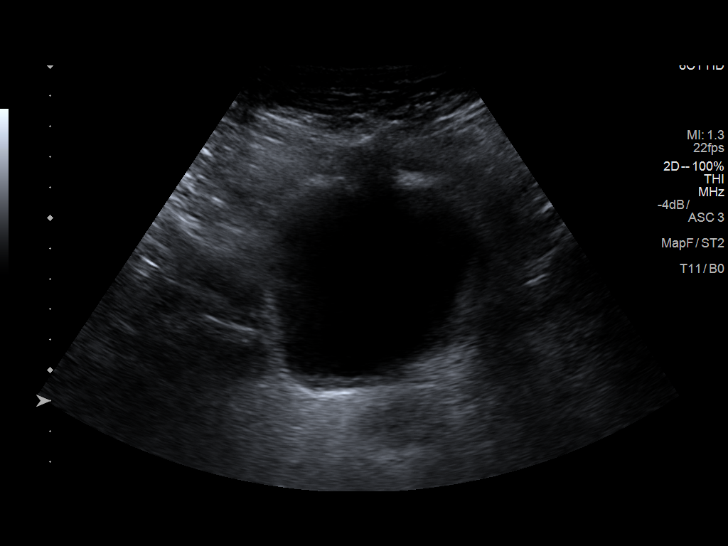
[im 17/31]
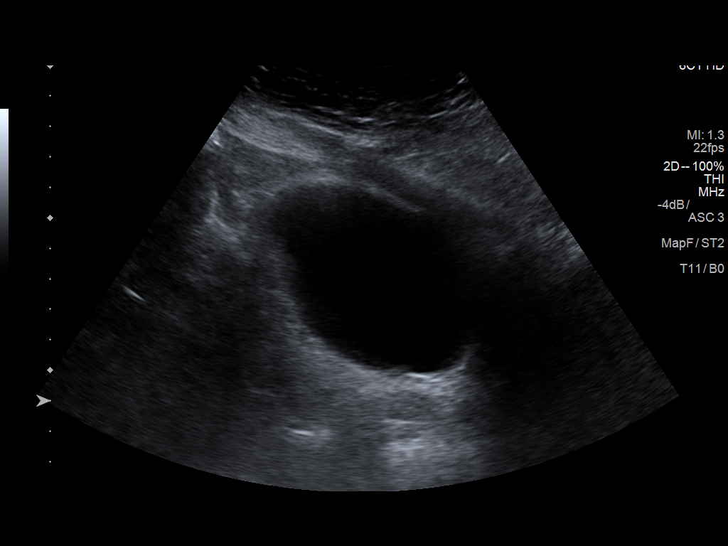
[im 19/31]
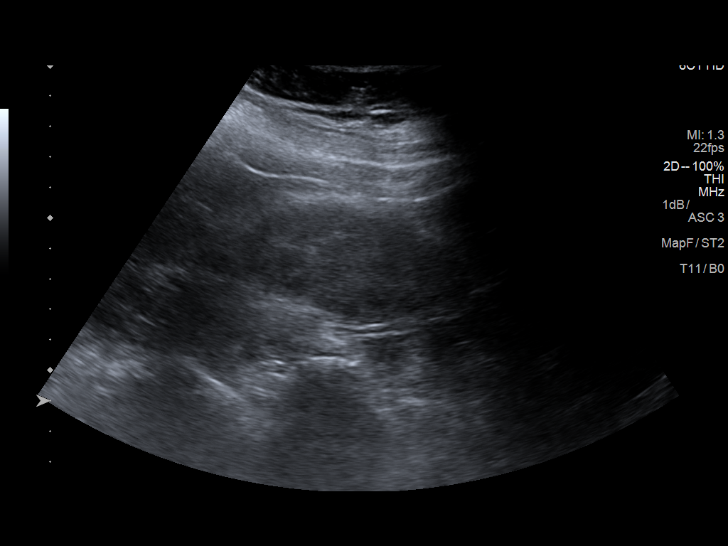
[im 21/31]
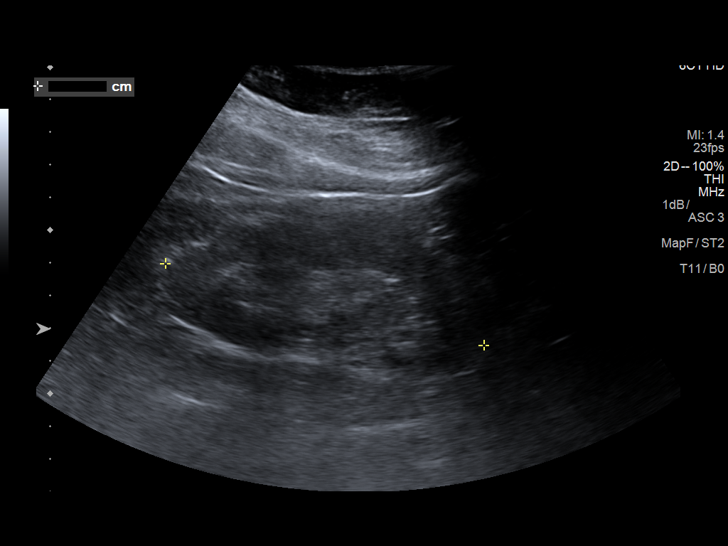
[im 23/31]
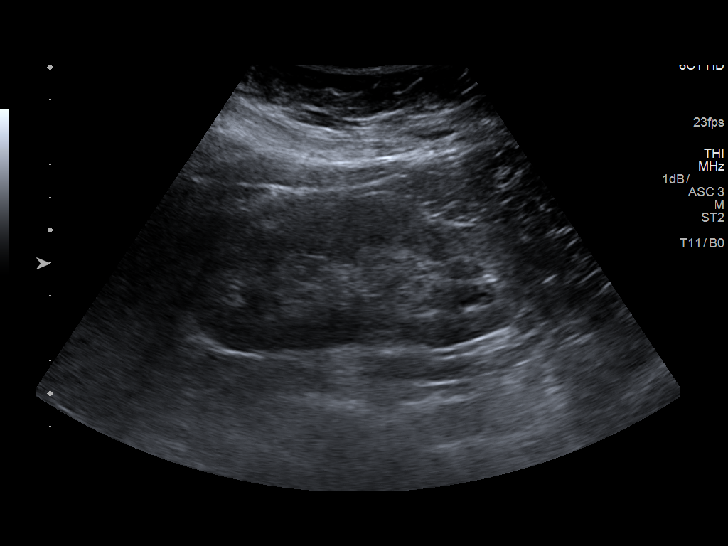
[im 26/31]
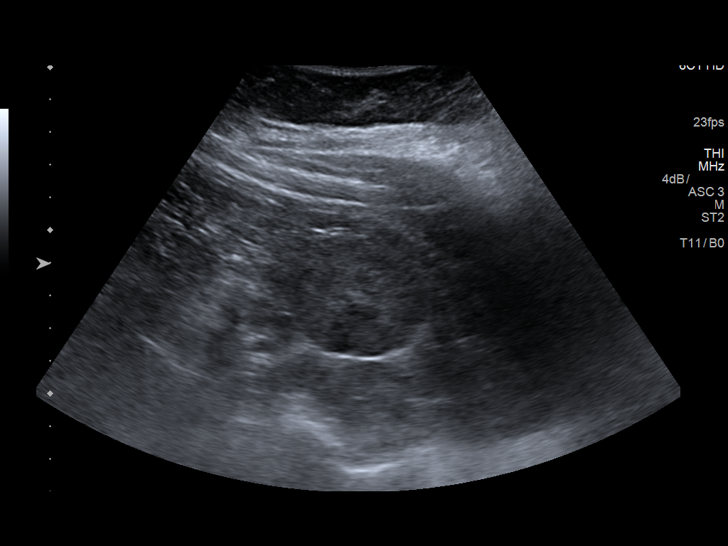
[im 28/31]
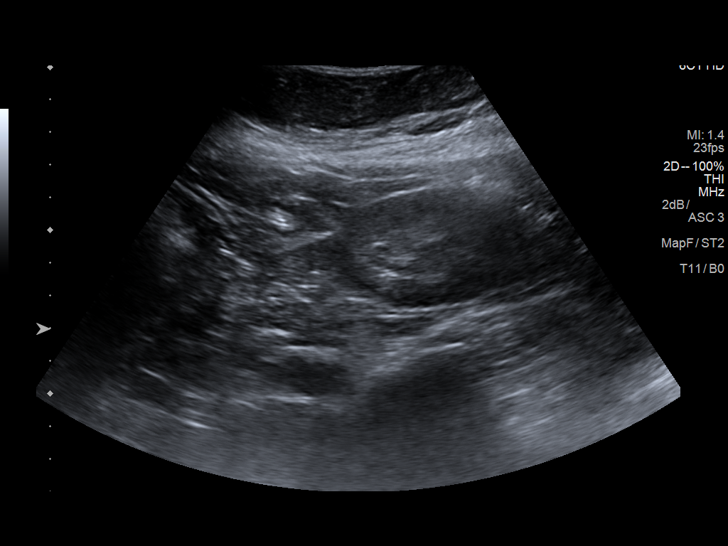
[im 31/31]
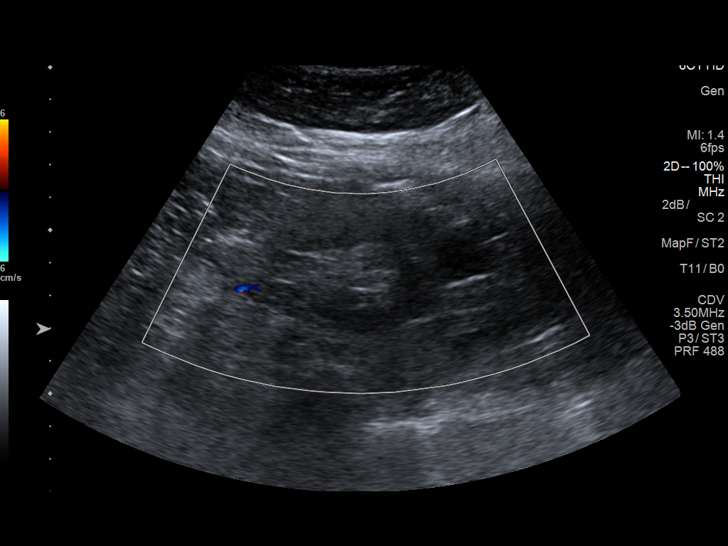

[14 of 25 positions shown; findings below may reference images not displayed]

FINDINGS: Right Kidney:

Length: 11.2 cm.. No hydronephrosis is seen. A cyst is present in
the lower pole laterally of 4.3 x 3.7 x 4.6 cm.

Left Kidney:

Length: 10.2 cm.. No hydronephrosis is noted. A 2.0 cm cyst is noted
in the periphery of the left mid kidney laterally.

Bladder:

The urinary bladder is unremarkable.

This study is somewhat compromised by large patient body habitus.
IMPRESSION: 1. No hydronephrosis.
2. Single bilateral renal cysts.

## 2017-05-09 IMAGING — US US RENAL
1 series · 3 of 3 positions shown · non-contrast
Comparison: CT abdomen pelvis of [DATE]

CLINICAL DATA: History of renal failure, also history of carcinoma
of the colon with partial colectomy

EXAM:
RENAL / URINARY TRACT ULTRASOUND COMPLETE

[Series 1: us renal · 0.17mm/px · 3 of 3 slices shown]
[im 1/3]
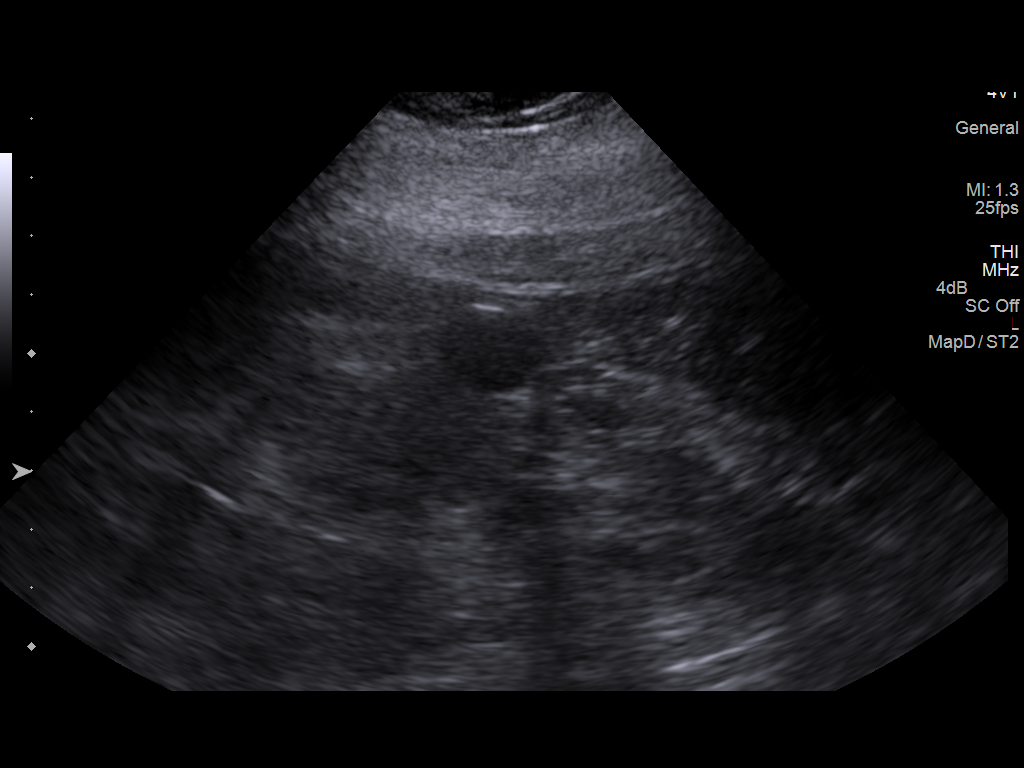
[im 2/3]
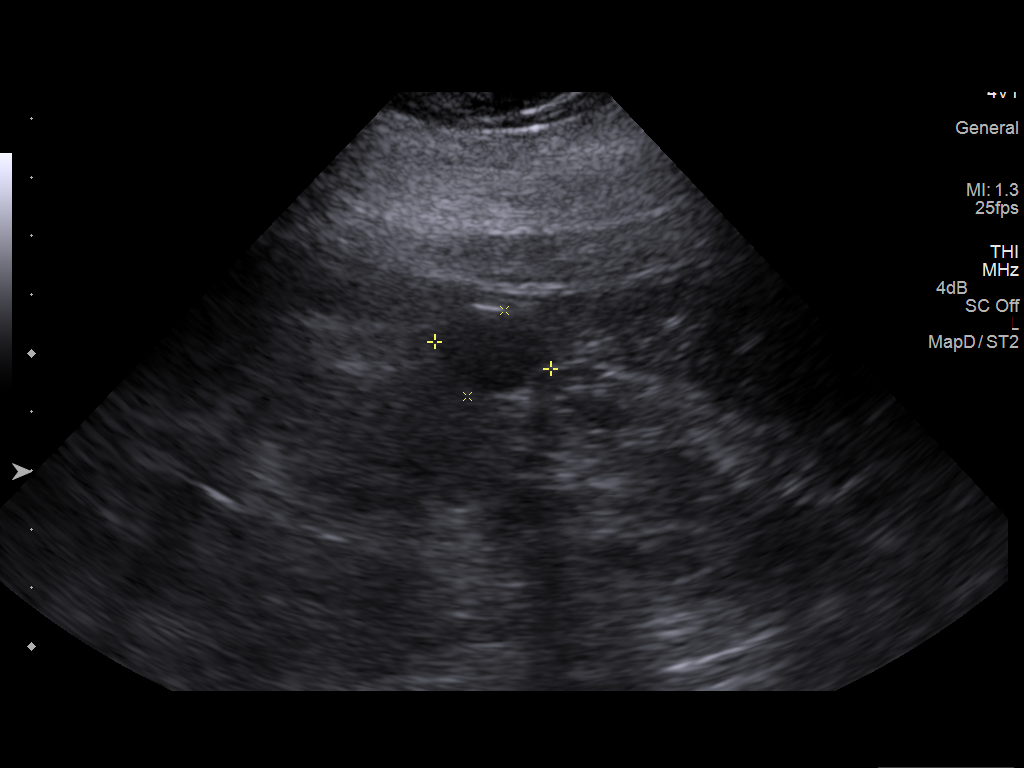
[im 3/3]
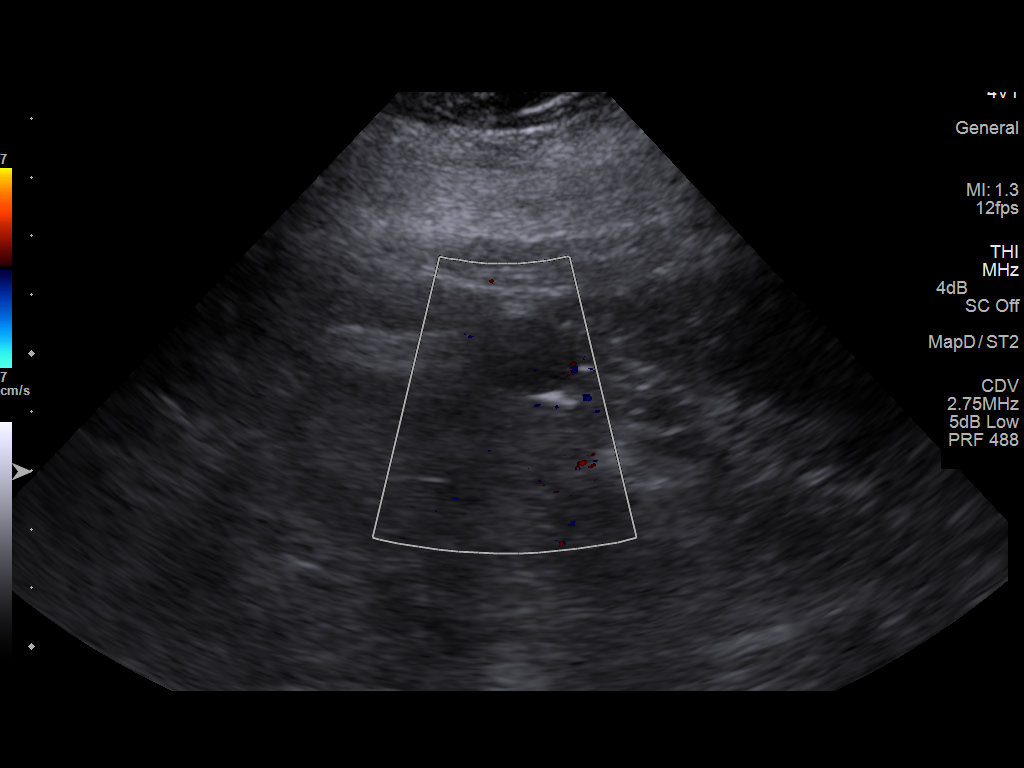

[3 of 3 positions shown; findings below may reference images not displayed]

FINDINGS: Right Kidney:

Length: 11.2 cm.. No hydronephrosis is seen. A cyst is present in
the lower pole laterally of 4.3 x 3.7 x 4.6 cm.

Left Kidney:

Length: 10.2 cm.. No hydronephrosis is noted. A 2.0 cm cyst is noted
in the periphery of the left mid kidney laterally.

Bladder:

The urinary bladder is unremarkable.

This study is somewhat compromised by large patient body habitus.
IMPRESSION: 1. No hydronephrosis.
2. Single bilateral renal cysts.

## 2017-05-23 DIAGNOSIS — N183 Chronic kidney disease, stage 3 (moderate): Secondary | ICD-10-CM | POA: Diagnosis not present

## 2017-05-23 DIAGNOSIS — R0600 Dyspnea, unspecified: Secondary | ICD-10-CM | POA: Diagnosis not present

## 2017-05-23 DIAGNOSIS — N184 Chronic kidney disease, stage 4 (severe): Secondary | ICD-10-CM | POA: Diagnosis not present

## 2017-05-23 DIAGNOSIS — R7309 Other abnormal glucose: Secondary | ICD-10-CM | POA: Diagnosis not present

## 2017-05-23 DIAGNOSIS — Z1231 Encounter for screening mammogram for malignant neoplasm of breast: Secondary | ICD-10-CM | POA: Diagnosis not present

## 2017-05-23 DIAGNOSIS — Z86711 Personal history of pulmonary embolism: Secondary | ICD-10-CM | POA: Diagnosis not present

## 2017-05-23 DIAGNOSIS — I129 Hypertensive chronic kidney disease with stage 1 through stage 4 chronic kidney disease, or unspecified chronic kidney disease: Secondary | ICD-10-CM | POA: Diagnosis not present

## 2017-05-23 DIAGNOSIS — Z0000 Encounter for general adult medical examination without abnormal findings: Secondary | ICD-10-CM | POA: Diagnosis not present

## 2017-05-23 DIAGNOSIS — R21 Rash and other nonspecific skin eruption: Secondary | ICD-10-CM | POA: Diagnosis not present

## 2017-05-23 DIAGNOSIS — E782 Mixed hyperlipidemia: Secondary | ICD-10-CM | POA: Diagnosis not present

## 2017-05-23 DIAGNOSIS — G4730 Sleep apnea, unspecified: Secondary | ICD-10-CM | POA: Diagnosis not present

## 2017-06-11 DIAGNOSIS — E669 Obesity, unspecified: Secondary | ICD-10-CM | POA: Diagnosis not present

## 2017-06-11 DIAGNOSIS — E785 Hyperlipidemia, unspecified: Secondary | ICD-10-CM | POA: Diagnosis not present

## 2017-06-11 DIAGNOSIS — D631 Anemia in chronic kidney disease: Secondary | ICD-10-CM | POA: Diagnosis not present

## 2017-06-11 DIAGNOSIS — Z720 Tobacco use: Secondary | ICD-10-CM | POA: Diagnosis not present

## 2017-06-11 DIAGNOSIS — N183 Chronic kidney disease, stage 3 (moderate): Secondary | ICD-10-CM | POA: Diagnosis not present

## 2017-06-12 DIAGNOSIS — R194 Change in bowel habit: Secondary | ICD-10-CM | POA: Diagnosis not present

## 2017-06-12 DIAGNOSIS — Z1211 Encounter for screening for malignant neoplasm of colon: Secondary | ICD-10-CM | POA: Diagnosis not present

## 2017-06-25 ENCOUNTER — Other Ambulatory Visit: Payer: Self-pay | Admitting: Gastroenterology

## 2017-06-25 DIAGNOSIS — K5730 Diverticulosis of large intestine without perforation or abscess without bleeding: Secondary | ICD-10-CM

## 2017-06-25 DIAGNOSIS — Z85038 Personal history of other malignant neoplasm of large intestine: Secondary | ICD-10-CM

## 2017-06-25 DIAGNOSIS — Z1211 Encounter for screening for malignant neoplasm of colon: Secondary | ICD-10-CM

## 2017-07-03 NOTE — Progress Notes (Signed)
[place]  Telephone:(336) [phone] Fax:(336) [phone]  Clinic follow up Note   Patient Care Team: Glendale Chard, MD as PCP - General (Internal Medicine) 07/07/2017  CHIEF COMPLAINTS:  Follow up anemia, history of thrombosis   HISTORY OF PRESENTING ILLNESS (08/14/2015):  Whitney Walker 61 y.o. female is referred here by her PCP Dr. Baird Cancer for mild leukocytosis.   She was previously seen by Dr. Jamse Arn in 2013 for leukocytosis. Her WBC has been around 11-18K since 08/2009, with predominantly neutrophils. BCR/ABL and check to mutation were checked in 2013 which were all negative. She also had a mild anemia in the past with hemoglobin in the 11-12 range, her platelet count has been normal.  She had PE in 10/2013 and was treated with Xaralto until 11/2014, she is now on aspirin. She has PVD and had left femoral popliteal vein bypass surgery in 07/2013. She also has COPD, she did use oxygen after she had PE, but not any more since Nov 2015 due to financial reasons. She has mild to moderate dyspenia on exertion.   She had tried quitting smoking a few times and finally stopped about 5 weeks ago.   She has had sinus infection on monthly bases, and recently caught a cold from her grand-grandson. She has headache for the past 4-5 month. She is being referred to see a neurologist.   She had early stage colon cancer in 2009, had surgical resection and no adjuvant chemo was needed. She had last colonoscopy in 05/2012  which was unremarkable. Her last mammogram was 2 years ago and she is planning to have a repeated one soon.   CURRENT THERAPY: Eliquis 2.5mg  bid (dose per renal function)   INTERIM HISTORY Whitney Walker returns for follow up. She had to stand during our visit due to her restless leg syndrome. She is not able to sit for a long amount of time. Her energy is very low. Her breathing is better when she sleeps with her back elevated. She has stopped taking her iron pills recently due to  the price. She is still compliant with her Eliquis.        MEDICAL HISTORY:  Past Medical History:  Diagnosis Date  . Anemia   . Anxiety   . Arthritis   . CHF (congestive heart failure) ([place])   . CKD (chronic kidney disease)    Stage III 03/2013  . Colon cancer ([place])   . COPD (chronic obstructive pulmonary disease) ([place])   . Coronary artery disease   . Depression   . Hypertension   . Meningitis   . PE (pulmonary embolism)   . Peripheral vascular disease ([place])   . Shortness of breath   . Sleep apnea   . Vertigo     SURGICAL HISTORY: Past Surgical History:  Procedure Laterality Date  . ABDOMINAL AORTAGRAM N/A 07/26/2013   Procedure: ABDOMINAL Maxcine Ham;  Surgeon: [name] Newburg, MD;  Location: [facility] CATH LAB;  Service: Cardiovascular;  Laterality: N/A;  . ABDOMINAL HYSTERECTOMY    . CESAREAN SECTION     X 3 [id number]  . CHOLECYSTECTOMY    . CHOLECYSTECTOMY  1980/s  . COLECTOMY  2010  . DIVERTICULOSIS SURGERY-2002  2012  . FEMORAL-POPLITEAL BYPASS GRAFT Left 08/13/2013   Procedure: BYPASS GRAFT FEMORAL-POPLITEAL ARTERY WITH NON-REVERSED SAPHANEOUS VEIN; ULTRASOUND GUIDED;  Surgeon: Mal [name], MD;  Location: [place];  Service: Vascular;  Laterality: Left;  . LOWER EXTREMITY ANGIOGRAM Left 07/26/2013   Procedure: LOWER EXTREMITY ANGIOGRAM;  Surgeon: Aaron Edelman  Starlyn Skeans, MD;  Location: [facility] CATH LAB;  Service: Cardiovascular;  Laterality: Left;    SOCIAL HISTORY: Social History   Social History  . Marital status: Single    Spouse name: N/A  . Number of children: 4  . Years of education: 12th gd   Occupational History  . N/A    Social History Main Topics  . Smoking status: Former Smoker    Packs/day: 1.50    Years: 40.00    Types: Cigarettes    Quit date: 08/09/2014  . Smokeless tobacco: Current User  . Alcohol use No     Comment: she used to drink alcohol, quit in 2010   . Drug use: No  . Sexual activity: Not on file   Other Topics Concern  . Not on file   Social  History Narrative   2 cups of coffee a day     FAMILY HISTORY: Family History  Problem Relation Age of Onset  . Cancer Mother 35       breast and bone  . Cancer Father 58       prostate  . Hypertension Sister   . Bleeding Disorder Sister   . Cancer Cousin 20       breast cancer   . Hypertension Daughter     ALLERGIES:  is allergic to gadolinium derivatives and iohexol.  MEDICATIONS:  Current Outpatient Prescriptions  Medication Sig Dispense Refill  . albuterol (PROVENTIL HFA;VENTOLIN HFA) 108 (90 BASE) MCG/ACT inhaler Inhale 2 puffs into the lungs every 6 (six) hours as needed for wheezing or shortness of breath.    [name] amLODipine (NORVASC) 10 MG tablet Take 10 mg by mouth daily.    [name] apixaban (ELIQUIS) 2.5 MG TABS tablet Take 1 tablet (2.5 mg total) by mouth 2 (two) times daily. 60 tablet 5  . BREO ELLIPTA 100-25 MCG/INH AEPB Inhale 1 puff into the lungs daily.  2  . cetirizine (ZYRTEC) 10 MG tablet TK 1 T PO QD  3  . ferrous sulfate 325 (65 FE) MG EC tablet Take 1 tablet (325 mg total) by mouth 3 (three) times daily with meals. 30 tablet 5  . mometasone (NASONEX) 50 MCG/ACT nasal spray Place 2 sprays into the nose daily.    . nebivolol (BYSTOLIC) 10 MG tablet Take 1 tablet (10 mg total) by mouth daily. (Patient not taking: Reported on 03/19/2017) 28 tablet 0  . Nebivolol HCl (BYSTOLIC) 20 MG TABS Take 1 tablet (20 mg total) by mouth daily. (Patient not taking: Reported on 07/07/2017) 14 tablet 0  . NYSTATIN powder APPLY TO AFFECTED AREA BID  2   No current facility-administered medications for this visit.     REVIEW OF SYSTEMS:   Constitutional: Denies fevers or abnormal night sweats (+) difficulty sleeping (+) fatigue (+)chills Eyes: Denies blurriness of vision, double vision or watery eyes Ears, nose, mouth, throat, and face: Denies mucositis or sore throat Respiratory: Denies cough, dyspnea or wheezes (+) SOB Cardiovascular: Denies palpitation, chest discomfort or lower  extremity swelling Gastrointestinal:  Denies nausea, heartburn or change in bowel habits Skin: Denies abnormal skin rashes Lymphatics: Denies new lymphadenopathy or easy bruising Neurological:Denies numbness, tingling or new weaknesses Behavioral/Psych: (+) Depression All other systems were reviewed with the patient and are negative.  PHYSICAL EXAMINATION:  ECOG PERFORMANCE STATUS: 2  Vitals:   07/07/17 1342  BP: (!) 139/93  Pulse: 98  Resp: 18  Temp: 98.2 F (36.8 C)   Filed Weights   07/07/17 1342  Weight: 223 lb 14.4 oz (101.6 kg)    GENERAL:alert, no distress and comfortable SKIN: skin color, texture, turgor are normal, no rashes or significant lesions EYES: normal, conjunctiva are pink and non-injected, sclera clear OROPHARYNX:no exudate, no erythema and lips, buccal mucosa, and tongue normal  NECK: supple, thyroid normal size, non-tender, without nodularity LYMPH:  no palpable lymphadenopathy in the cervical, axillary or inguinal LUNGS: clear to auscultation and percussion (+) labored breathing HEART: regular rate & rhythm and no murmurs. (+) lower extremity edema ABDOMEN:abdomen soft, non-tender and normal bowel sounds Musculoskeletal:no cyanosis of digits and no clubbing  PSYCH: alert & oriented x 3 with fluent speech NEURO: no focal motor/sensory deficits  LABORATORY DATA:  I have reviewed the data as listed CBC Latest Ref Rng & Units 07/07/2017 03/03/2017 03/02/2017  WBC 3.9 - 10.3 10e3/uL 13.5(H) 12.9(H) 12.2(H)  Hemoglobin 11.6 - 15.9 g/dL 10.9(L) 10.1(L) 10.3(L)  Hematocrit 34.8 - 46.6 % 35.0 33.3(L) 33.4(L)  Platelets 145 - 400 10e3/uL 275 253 255    CMP Latest Ref Rng & Units 07/07/2017 03/02/2017 01/03/2017  Glucose 70 - 140 mg/dl 85 104(H) 151(H)  BUN 7.0 - 26.0 mg/dL 40.8(H) 45(H) 47.5(H)  Creatinine 0.6 - 1.1 mg/dL 3.4(HH) 3.81(H) 3.4 Repeated and Verified(HH)  Sodium 136 - 145 mEq/L 138 138 139  Potassium 3.5 - 5.1 mEq/L 4.5 4.5 4.6  Chloride 101 - 111  mmol/L - 113(H) -  CO2 22 - 29 mEq/L 17(L) 18(L) 14(L)  Calcium 8.4 - 10.4 mg/dL 9.5 8.5(L) 8.6  Total Protein 6.4 - 8.3 g/dL 7.2 6.8 7.1  Total Bilirubin 0.20 - 1.20 mg/dL <0.22 0.3 <0.22  Alkaline Phos 40 - 150 U/L 112 94 144  AST 5 - 34 U/L 14 20 19   ALT 0 - 55 U/L 16 25 20    PATHOLOGY REPORT Diagnosis 02/06/2016 Breast, left, needle core biopsy, 11:00 o'clock - FAT NECROSIS WITH ASSOCIATED CALCIFICATION. - NO ATYPIA OR TUMOR SEEN.  RADIOGRAPHIC STUDIES: I have personally reviewed the radiological images as listed and agreed with the findings in the report. No results found.  ASSESSMENT & PLAN:  61 y.o. female with history of DVT, chronic mild leukocytosis and anemia.  1. Recurrent DVT and PE -She previously had episodes of PE, was treated with Xarelto. 10 months after she came off Xarelto, she developed unprovoked DVT and PE. -I recommend her to continue anticoagulation indefinitely, if no contraindications. -Patient was previously on Coumadin, but switched to Eliquis due to her transportation issues with Coumadin check up. -She has stage IV CKD. We previoiusly discussed that coumadin is a better option for patients with severe renal dysfunction. However she has difficulty with her transportation for coumadin checkup, and wishes to continue Eliquis. She takes Eliquis 2.5mg  BID, will continue indefinitely.  3. Mild leukocytosis, with predominant neutrophils -Her total white count TODAY is 13.5K, with neutrophil 10.3K. Her mild leukocytosis has been stable over the past 3 years, previous workup was negative for BCR/ABL or JAK 2 mutation. This is likely reactive, secondary to her smoking and chronic inflammations (allergy and sinus Infection). No evidence of myeloproliferative neoplasm.  -Follow-up CBC with diff   4. Anemia of chronic disease and anemia of iron deficiency -She had hemoglobin 10-11 lately, overall stable, likely related to her stage III chronic kidney disease  - Her  iron study and SPEP were normal in 2013 -Iron 41 TODAY. %SAT 16. HGB 10.9 I encourage her to continue oral iron supplement, she is concerned about the cost  -  I will call in prescription iron to see what co pay is  -Follow-up CBC once a year   5. History of colon cancer in 2009  -She has completed 5 years of surveillance.  -last colonoscopy in 05/2012, next due 2018  6. CKD, stage IV -follow up with primary care physician -Creatinine 3.4 TODAY. We do not have data concerning Eliquis and Creatinine this high. Advised her to take Eliquis 2.5 mg BID.  7. OSA, PVD, CHF -She will continue follow-up with her primary care physician and other specialists.  8. Restless leg syndrome -f/u with PCP   Plan -Iron 41 TODAY. I encourage her continue iron supplement ferrous sulfate once daily. I will repeat her iron study on next visit. -call in prescription iron - mammogram within next month - f/u In 6 months   All questions were answered. The patient knows to call the clinic with any problems, questions or concerns.  I spent 20 minutes counseling the patient face to face. The total time spent in the appointment was 30 minutes and more than 50% was on counseling.     Truitt Merle, MD 07/07/2017   This document serves as a record of services personally performed by Truitt Merle, MD. It was created on her behalf by Brandt Loosen, a trained medical scribe. The creation of this record is based on the scribe's personal observations and the provider's statements to them. This document has been checked and approved by the attending provider.

## 2017-07-07 ENCOUNTER — Other Ambulatory Visit (HOSPITAL_BASED_OUTPATIENT_CLINIC_OR_DEPARTMENT_OTHER): Payer: Medicare Other

## 2017-07-07 ENCOUNTER — Ambulatory Visit (HOSPITAL_BASED_OUTPATIENT_CLINIC_OR_DEPARTMENT_OTHER): Payer: Medicare Other | Admitting: Hematology

## 2017-07-07 VITALS — BP 139/93 | HR 98 | Temp 98.2°F | Resp 18 | Ht 63.0 in | Wt 223.9 lb

## 2017-07-07 DIAGNOSIS — D72829 Elevated white blood cell count, unspecified: Secondary | ICD-10-CM | POA: Diagnosis not present

## 2017-07-07 DIAGNOSIS — N183 Chronic kidney disease, stage 3 unspecified: Secondary | ICD-10-CM

## 2017-07-07 DIAGNOSIS — G2581 Restless legs syndrome: Secondary | ICD-10-CM | POA: Diagnosis not present

## 2017-07-07 DIAGNOSIS — Z86711 Personal history of pulmonary embolism: Secondary | ICD-10-CM

## 2017-07-07 DIAGNOSIS — Z85038 Personal history of other malignant neoplasm of large intestine: Secondary | ICD-10-CM

## 2017-07-07 DIAGNOSIS — G4733 Obstructive sleep apnea (adult) (pediatric): Secondary | ICD-10-CM

## 2017-07-07 DIAGNOSIS — N184 Chronic kidney disease, stage 4 (severe): Secondary | ICD-10-CM | POA: Diagnosis not present

## 2017-07-07 DIAGNOSIS — I509 Heart failure, unspecified: Secondary | ICD-10-CM

## 2017-07-07 DIAGNOSIS — I82412 Acute embolism and thrombosis of left femoral vein: Secondary | ICD-10-CM

## 2017-07-07 DIAGNOSIS — D509 Iron deficiency anemia, unspecified: Secondary | ICD-10-CM | POA: Diagnosis not present

## 2017-07-07 DIAGNOSIS — Z7901 Long term (current) use of anticoagulants: Secondary | ICD-10-CM

## 2017-07-07 DIAGNOSIS — R928 Other abnormal and inconclusive findings on diagnostic imaging of breast: Secondary | ICD-10-CM

## 2017-07-07 DIAGNOSIS — D631 Anemia in chronic kidney disease: Secondary | ICD-10-CM

## 2017-07-07 DIAGNOSIS — Z86718 Personal history of other venous thrombosis and embolism: Secondary | ICD-10-CM | POA: Diagnosis not present

## 2017-07-07 DIAGNOSIS — I739 Peripheral vascular disease, unspecified: Secondary | ICD-10-CM | POA: Diagnosis not present

## 2017-07-07 DIAGNOSIS — Z1231 Encounter for screening mammogram for malignant neoplasm of breast: Secondary | ICD-10-CM

## 2017-07-07 LAB — COMPREHENSIVE METABOLIC PANEL
ALT: 16 U/L (ref 0–55)
AST: 14 U/L (ref 5–34)
Albumin: 2.7 g/dL — ABNORMAL LOW (ref 3.5–5.0)
Alkaline Phosphatase: 112 U/L (ref 40–150)
Anion Gap: 6 mEq/L (ref 3–11)
BUN: 40.8 mg/dL — ABNORMAL HIGH (ref 7.0–26.0)
CO2: 17 mEq/L — ABNORMAL LOW (ref 22–29)
Calcium: 9.5 mg/dL (ref 8.4–10.4)
Chloride: 115 mEq/L — ABNORMAL HIGH (ref 98–109)
Creatinine: 3.4 mg/dL (ref 0.6–1.1)
EGFR: 14 mL/min/{1.73_m2} — ABNORMAL LOW (ref >90)
Glucose: 85 mg/dl (ref 70–140)
Potassium: 4.5 mEq/L (ref 3.5–5.1)
Sodium: 138 mEq/L (ref 136–145)
Total Bilirubin: <0.22 mg/dL (ref 0.20–1.20)
Total Protein: 7.2 g/dL (ref 6.4–8.3)

## 2017-07-07 LAB — CBC WITH DIFFERENTIAL/PLATELET
BASO%: 0.1 % (ref 0.0–2.0)
Basophils Absolute: 0 10*3/uL (ref 0.0–0.1)
EOS%: 1.1 % (ref 0.0–7.0)
Eosinophils Absolute: 0.2 10*3/uL (ref 0.0–0.5)
HCT: 35 % (ref 34.8–46.6)
HGB: 10.9 g/dL — ABNORMAL LOW (ref 11.6–15.9)
LYMPH%: 17.4 % (ref 14.0–49.7)
MCH: 26.5 pg (ref 25.1–34.0)
MCHC: 31.1 g/dL — ABNORMAL LOW (ref 31.5–36.0)
MCV: 85.2 fL (ref 79.5–101.0)
MONO#: 0.8 10*3/uL (ref 0.1–0.9)
MONO%: 5.6 % (ref 0.0–14.0)
NEUT#: 10.3 10*3/uL — ABNORMAL HIGH (ref 1.5–6.5)
NEUT%: 75.8 % (ref 38.4–76.8)
Platelets: 275 10*3/uL (ref 145–400)
RBC: 4.11 10*6/uL (ref 3.70–5.45)
RDW: 15.8 % — ABNORMAL HIGH (ref 11.2–14.5)
WBC: 13.5 10*3/uL — ABNORMAL HIGH (ref 3.9–10.3)
lymph#: 2.4 10*3/uL (ref 0.9–3.3)

## 2017-07-07 LAB — IRON AND TIBC
%SAT: 16 % — ABNORMAL LOW (ref 21–57)
Iron: 41 ug/dL (ref 41–142)
TIBC: 248 ug/dL (ref 236–444)
UIBC: 207 ug/dL (ref 120–384)

## 2017-07-07 LAB — FERRITIN: Ferritin: 153 ng/ml (ref 9–269)

## 2017-07-07 MED ORDER — FERROUS SULFATE 325 (65 FE) MG PO TBEC: 325.0000 mg | DELAYED_RELEASE_TABLET | Freq: Three times a day (TID) | ORAL | 5 refills | Status: DC

## 2017-07-07 MED ORDER — APIXABAN 2.5 MG PO TABS: 2.5000 mg | ORAL_TABLET | Freq: Two times a day (BID) | ORAL | 5 refills | Status: DC

## 2017-07-12 ENCOUNTER — Encounter: Payer: Self-pay | Admitting: Hematology

## 2017-07-15 ENCOUNTER — Inpatient Hospital Stay
Admission: RE | Admit: 2017-07-15 | Discharge: 2017-07-15 | Disposition: A | Discharge status: Home / Self Care | Payer: Self-pay | Source: Ambulatory Visit | Attending: Gastroenterology | Admitting: Gastroenterology

## 2017-07-24 ENCOUNTER — Ambulatory Visit
Admission: RE | Admit: 2017-07-24 | Discharge: 2017-07-24 | Disposition: A | Discharge status: Home / Self Care | Payer: Medicare Other | Source: Ambulatory Visit | Attending: Gastroenterology | Admitting: Gastroenterology

## 2017-07-24 DIAGNOSIS — K5790 Diverticulosis of intestine, part unspecified, without perforation or abscess without bleeding: Secondary | ICD-10-CM | POA: Diagnosis not present

## 2017-07-24 DIAGNOSIS — K5730 Diverticulosis of large intestine without perforation or abscess without bleeding: Secondary | ICD-10-CM

## 2017-07-24 DIAGNOSIS — Z85038 Personal history of other malignant neoplasm of large intestine: Secondary | ICD-10-CM

## 2017-07-24 IMAGING — CT CT VIRTUAL COLONOSCOPY DIAGNOSTIC
3 of 7 series · 12 of 36 positions shown, 18 images · non-contrast
Comparison: Is

CLINICAL DATA: Diverticulosis

EXAM:
CT VIRTUAL COLONOSCOPY DIAGNOSTIC
TECHNIQUE: The patient was given a standard bowel preparation with Gastrografin
and barium for fluid and stool tagging respectively. The quality of
the bowel preparation is Cough moderate. Automated CO2 insufflation
of the colon was performed prior to image acquisition and colonic
distention is excellent. Image post processing was used to generate
a 3D endoluminal fly-through projection of the colon and to
electronically subtract stool/fluid as appropriate.

[Series 2: supine (id) · axial · 0.92mm/px · z∈[-420,-236]mm · 4 of 394 slices shown]
[im 50/394  soft-tissue]
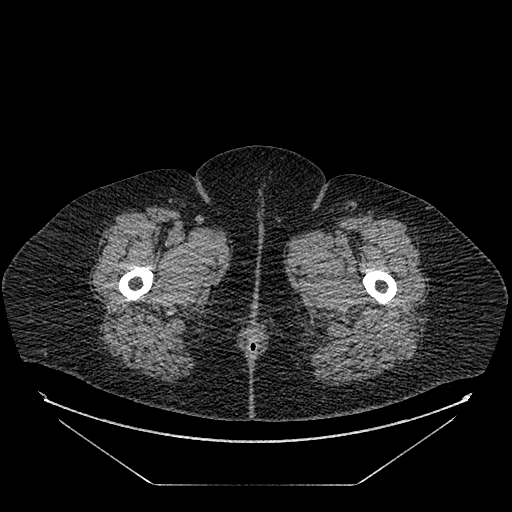
[im 99/394  soft-tissue]
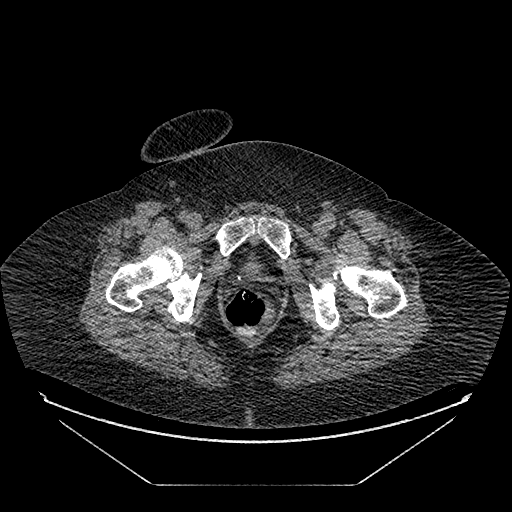
[im 148/394  soft-tissue]
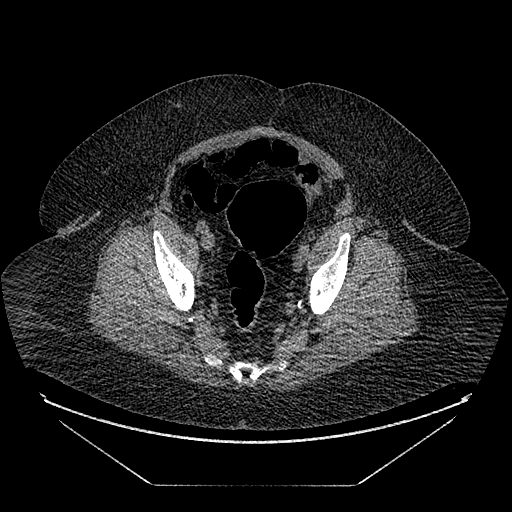
[im 197/394  soft-tissue]
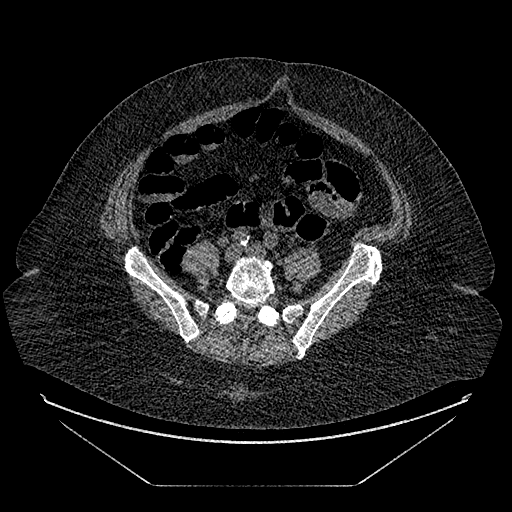

[Series 6: prone (id) · axial · 0.88mm/px · z∈[-424,-38]mm · 7 of 413 slices shown, 12 images]
[im 52/413  soft-tissue]
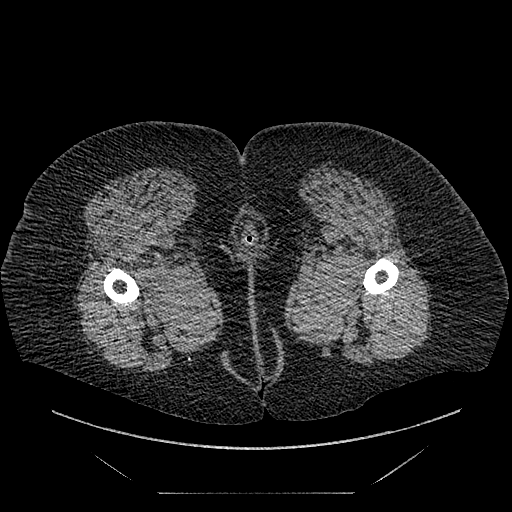
[im 52/413  bone]
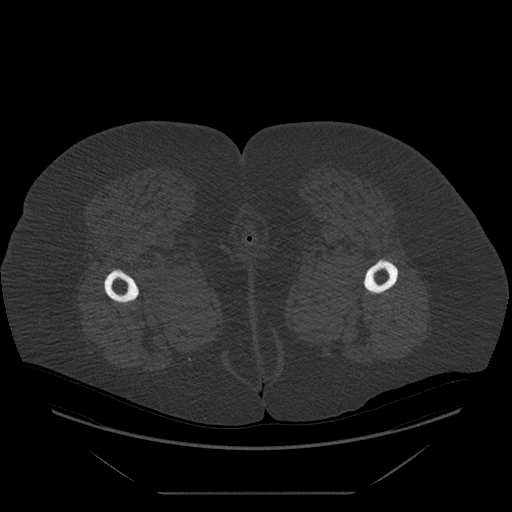
[im 104/413  soft-tissue]
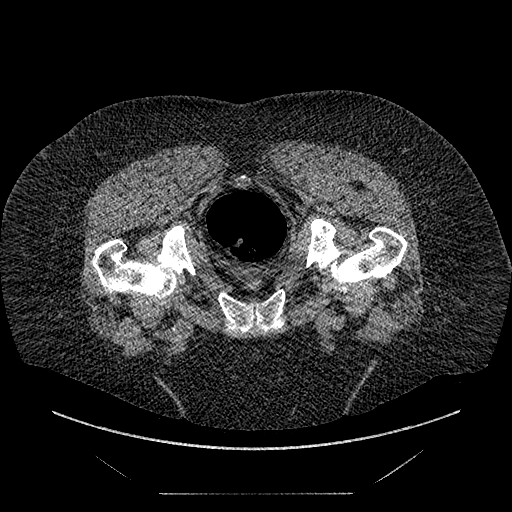
[im 155/413  soft-tissue]
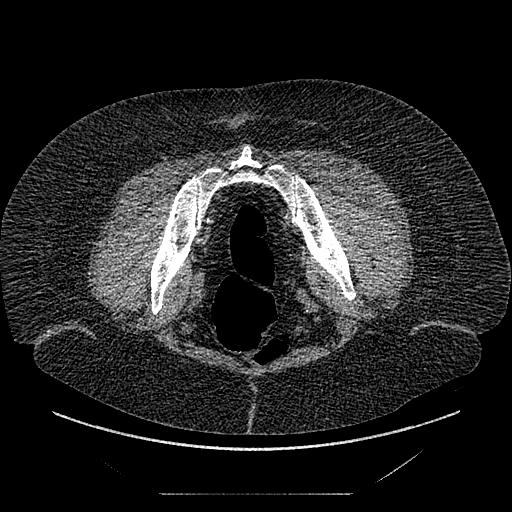
[im 207/413  soft-tissue]
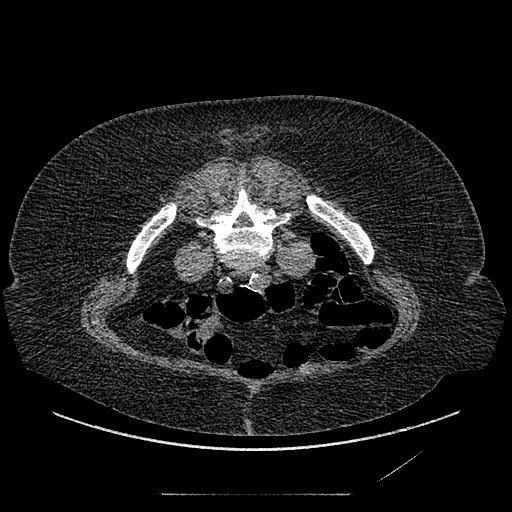
[im 207/413  lung]
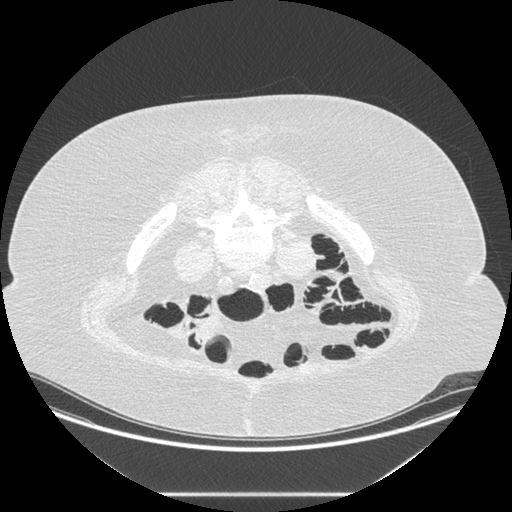
[im 258/413  soft-tissue]
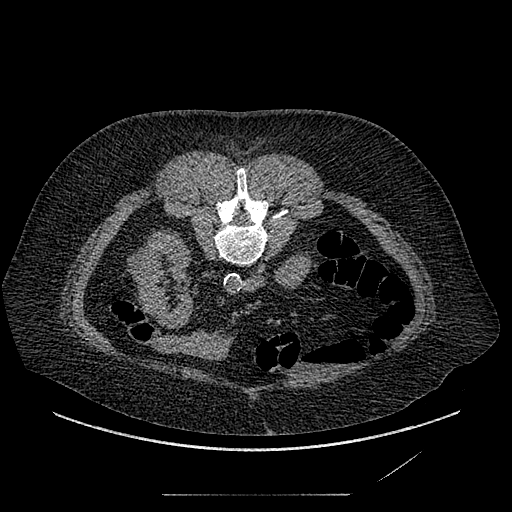
[im 258/413  lung]
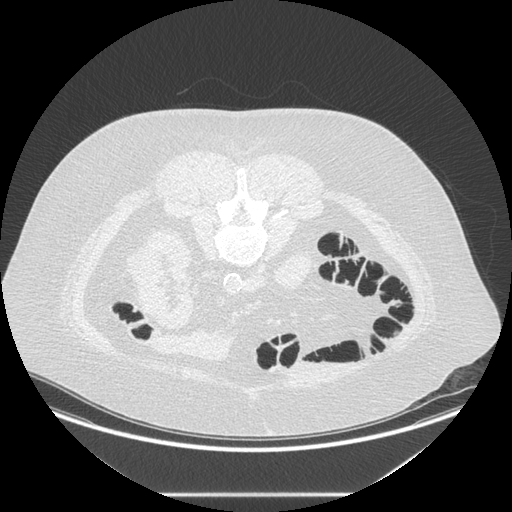
[im 310/413  soft-tissue]
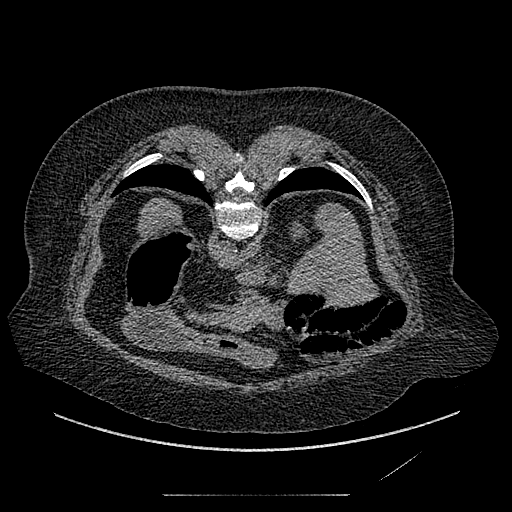
[im 310/413  lung]
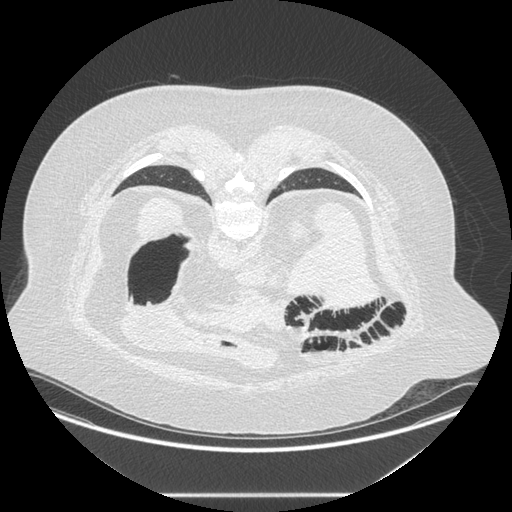
[im 361/413  soft-tissue]
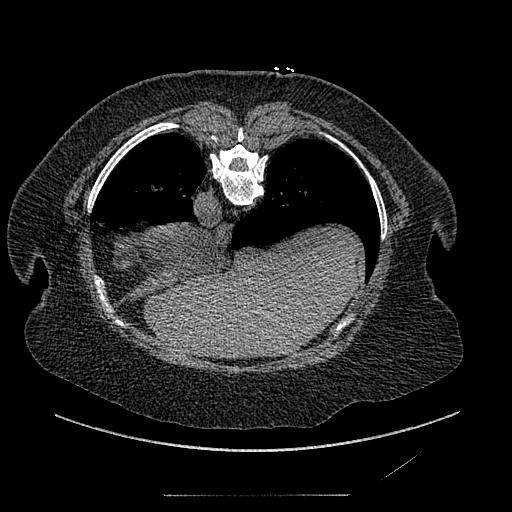
[im 361/413  lung]
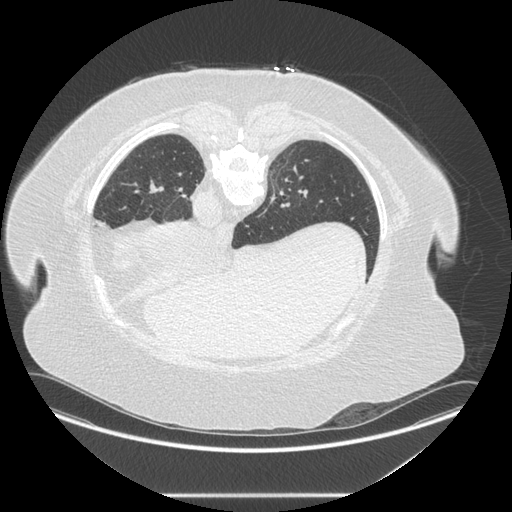

[Series 601: coronal body · coronal · 0.96mm/px · 1 of 141 slices shown, 2 images]
[im 47/141  soft-tissue]
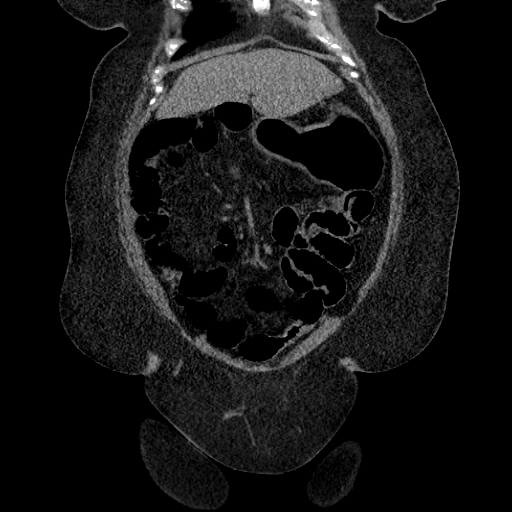
[im 47/141  bone]
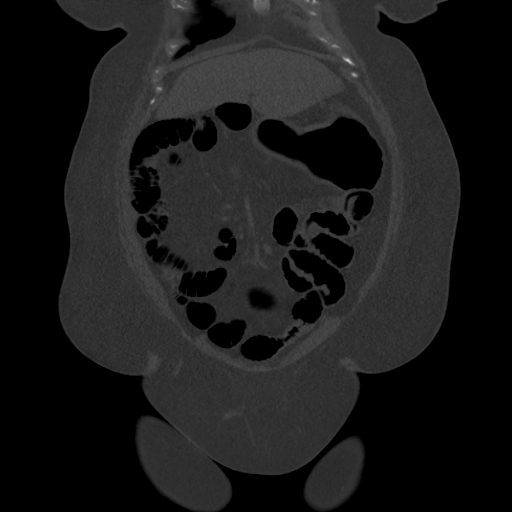

[12 of 36 positions shown; findings below may reference images not displayed]

FINDINGS: VIRTUAL COLONOSCOPY

Patient is status post partial colectomy with only the rectum and
distal half of the sigmoid colon remaining. There are no polypoid
filling defects noted. No constricting lesions. The small bowel
anastomosis is not definitively seen.

Virtual colonoscopy is not designed to detect diminutive polyps
(i.e., less than or equal to 5 mm), the presence or absence of which
may not affect clinical management.

CT ABDOMEN AND PELVIS WITHOUT CONTRAST

Lower chest: Moderate pericardial effusion, increased since prior
study. Linear scarring or atelectasis in the lung bases. No
effusions.

Hepatobiliary: Prior cholecystectomy. No visible focal hepatic
abnormality.

Pancreas: No focal abnormality or ductal dilatation.

Spleen: Lobular contours of the spleen without focal abnormality.
This is unchanged.

Adrenals/Urinary Tract: Low-density lesion in the mid to lower pole
of the right kidney has enlarged, 5 cm currently compared to 2.5 cm
previously. This cannot be characterized without intravenous
contrast. Also hypodense lesion in the midpole of the left kidney
has enlarged slightly measuring 2 cm compared with 1 cm previously.
No hydronephrosis. Adrenal glands and urinary bladder are
unremarkable.

Stomach/Bowel: Stomach and small bowel grossly unremarkable.

Vascular/Lymphatic: No evidence of aneurysm or adenopathy. Aortic
and iliac calcifications.

Reproductive: Prior hysterectomy.  No adnexal masses.

Other: No free fluid or free air.

Musculoskeletal: No acute bony abnormality.
IMPRESSION: Status post partial colectomy with only distal sigmoid colon and
rectum remaining. No visible annular constricting lesions or
polypoid filling defects.

Hypodensities in the kidneys bilaterally, shown on prior ultrasound
to represent cysts.

Aortoiliac atherosclerosis.

Prior cholecystectomy and hysterectomy.

Moderate pericardial effusion, increased since prior CT.

## 2017-08-27 ENCOUNTER — Encounter: Payer: Self-pay | Admitting: Family

## 2017-09-04 ENCOUNTER — Ambulatory Visit (HOSPITAL_COMMUNITY)
Admission: RE | Admit: 2017-09-04 | Discharge: 2017-09-04 | Disposition: A | Discharge status: Home / Self Care | Payer: Medicare Other | Source: Ambulatory Visit | Attending: Vascular Surgery | Admitting: Vascular Surgery

## 2017-09-04 ENCOUNTER — Ambulatory Visit (INDEPENDENT_AMBULATORY_CARE_PROVIDER_SITE_OTHER): Payer: Medicare Other | Admitting: Physician Assistant

## 2017-09-04 ENCOUNTER — Ambulatory Visit (INDEPENDENT_AMBULATORY_CARE_PROVIDER_SITE_OTHER)
Admission: RE | Admit: 2017-09-04 | Discharge: 2017-09-04 | Disposition: A | Discharge status: Home / Self Care | Payer: Medicare Other | Source: Ambulatory Visit | Attending: Vascular Surgery | Admitting: Vascular Surgery

## 2017-09-04 VITALS — BP 138/81 | HR 72 | Temp 97.7°F | Resp 18 | Ht 63.0 in | Wt 220.0 lb

## 2017-09-04 DIAGNOSIS — Z95828 Presence of other vascular implants and grafts: Secondary | ICD-10-CM | POA: Diagnosis not present

## 2017-09-04 DIAGNOSIS — I779 Disorder of arteries and arterioles, unspecified: Secondary | ICD-10-CM

## 2017-09-04 DIAGNOSIS — I82502 Chronic embolism and thrombosis of unspecified deep veins of left lower extremity: Secondary | ICD-10-CM | POA: Insufficient documentation

## 2017-09-04 NOTE — Progress Notes (Signed)
Patient name: Whitney Walker MRN: [id number] DOB: 09-Mar-1956 Sex: female    HPI: Whitney Walker is a 61 y.o. female who returns for surveillance  follow up s/p left fem-pop bypass by Dr. Kellie Simmering 2014.  Her primary symptoms  Left 4th and 5th toe ulcers with pain.  Her toes have healed and she has had recurrent wounds since.  She reports no symptoms of claudication and is able to carry out ADLs.   Other medical problems include: HX of DVT and PE which is chronically managed with Eliquis monitored by her cardiologist Dr. Nadyne Coombes.  HTN managed with Bystolic and Norvasc.  She denise DM and CAD.  Past Medical History:  Diagnosis Date  . Anemia   . Anxiety   . Arthritis   . CHF (congestive heart failure) ([place])   . CKD (chronic kidney disease)    Stage III 03/2013  . Colon cancer ([place])   . COPD (chronic obstructive pulmonary disease) ([place])   . Coronary artery disease   . Depression   . Hypertension   . Meningitis   . PE (pulmonary embolism)   . Peripheral vascular disease ([place])   . Shortness of breath   . Sleep apnea   . Vertigo    Past Surgical History:  Procedure Laterality Date  . ABDOMINAL AORTAGRAM N/A 07/26/2013   Procedure: ABDOMINAL Maxcine Ham;  Surgeon: [name] Bergen, MD;  Location: [facility] CATH LAB;  Service: Cardiovascular;  Laterality: N/A;  . ABDOMINAL HYSTERECTOMY    . CESAREAN SECTION     X 3 [id number]  . CHOLECYSTECTOMY    . CHOLECYSTECTOMY  1980/s  . COLECTOMY  2010  . DIVERTICULOSIS SURGERY-2002  2012  . FEMORAL-POPLITEAL BYPASS GRAFT Left 08/13/2013   Procedure: BYPASS GRAFT FEMORAL-POPLITEAL ARTERY WITH NON-REVERSED SAPHANEOUS VEIN; ULTRASOUND GUIDED;  Surgeon: Mal [name], MD;  Location: [place];  Service: Vascular;  Laterality: Left;  . LOWER EXTREMITY ANGIOGRAM Left 07/26/2013   Procedure: LOWER EXTREMITY ANGIOGRAM;  Surgeon: [name] [place], MD;  Location: [facility] CATH LAB;  Service: Cardiovascular;  Laterality: Left;    Family History  Problem Relation Age of Onset  .  Cancer Mother 59       breast and bone  . Cancer Father 3       prostate  . Hypertension Sister   . Bleeding Disorder Sister   . Cancer Cousin 20       breast cancer   . Hypertension Daughter     SOCIAL HISTORY: Social History   Social History  . Marital status: Single    Spouse name: N/A  . Number of children: 4  . Years of education: 12th gd   Occupational History  . N/A    Social History Main Topics  . Smoking status: Former Smoker    Packs/day: 1.50    Years: 40.00    Types: Cigarettes    Quit date: 08/09/2014  . Smokeless tobacco: Current User  . Alcohol use No     Comment: she used to drink alcohol, quit in 2010   . Drug use: No  . Sexual activity: Not on file   Other Topics Concern  . Not on file   Social History Narrative   2 cups of coffee a day     Allergies  Allergen Reactions  . Gadolinium Derivatives Hives and Other (See Comments)    HIVES, Desc: HIVES W/ "DYE" USED FOR 1ST CT SCAN BUT NOT 2ND, NO PREMEDS USED, PT UNCERTAIN OF CIRCUMSTANCES,,?POSSIBLE  MRI CONTRAST ALLERGY, ALL STUDIES DONE "SOMEWHERE" IN [place]//A.C., Onset Date: [id number]  . Iohexol Other (See Comments)     Code: HIVES, Desc: HIVES W/ "DYE" USED FOR 1ST CT SCAN BUT NOT 2ND, NO PREMEDS USED, PT UNCERTAIN OF CIRCUMSTANCES,,?POSSIBLE MRI CONTRAST ALLERGY, ALL STUDIES DONE "SOMEWHERE" IN [place]//A.C., Onset Date: [id number]     Current Outpatient Prescriptions  Medication Sig Dispense Refill  . albuterol (PROVENTIL HFA;VENTOLIN HFA) 108 (90 BASE) MCG/ACT inhaler Inhale 2 puffs into the lungs every 6 (six) hours as needed for wheezing or shortness of breath.    [name] amLODipine (NORVASC) 10 MG tablet Take 10 mg by mouth daily.    [name] apixaban (ELIQUIS) 2.5 MG TABS tablet Take 1 tablet (2.5 mg total) by mouth 2 (two) times daily. 60 tablet 5  . BREO ELLIPTA 100-25 MCG/INH AEPB Inhale 1 puff into the lungs daily.  2  . cetirizine (ZYRTEC) 10 MG tablet TK 1 T PO QD  3  . ferrous  sulfate 325 (65 FE) MG EC tablet Take 1 tablet (325 mg total) by mouth 3 (three) times daily with meals. 30 tablet 5  . mometasone (NASONEX) 50 MCG/ACT nasal spray Place 2 sprays into the nose daily.    . NYSTATIN powder APPLY TO AFFECTED AREA BID  2  . nebivolol (BYSTOLIC) 10 MG tablet Take 1 tablet (10 mg total) by mouth daily. (Patient not taking: Reported on 03/19/2017) 28 tablet 0  . Nebivolol HCl (BYSTOLIC) 20 MG TABS Take 1 tablet (20 mg total) by mouth daily. (Patient not taking: Reported on 07/07/2017) 14 tablet 0   No current facility-administered medications for this visit.     ROS:   General:  No weight loss, Fever, chills  HEENT: No recent headaches, no nasal bleeding, no visual changes, no sore throat  Neurologic: No dizziness, blackouts, seizures. No recent symptoms of stroke or mini- stroke. No recent episodes of slurred speech, or temporary blindness.  Cardiac: No recent episodes of chest pain/pressure, no shortness of breath at rest.  No shortness of breath with exertion.  Denies history of atrial fibrillation or irregular heartbeat  Vascular: No history of rest pain in feet.  No history of claudication.  No history of non-healing ulcer, Positive history of DVT/PE  Pulmonary: No home oxygen, no productive cough, no hemoptysis,  No asthma or wheezing  Musculoskeletal:  [ ]  Arthritis, [ ]  Low back pain,  [ ]  Joint pain  Hematologic:No history of hypercoagulable state.  No history of easy bleeding.  No history of anemia  Gastrointestinal: No hematochezia or melena,  No gastroesophageal reflux, no trouble swallowing  Urinary: [X]  chronic Kidney disease, [ ]  on HD - [ ]  MWF or [ ]  TTHS, [ ]  Burning with urination, [ ]  Frequent urination, [ ]  Difficulty urinating;   Skin: No rashes  Psychological: No history of anxiety,  No history of depression   Physical Examination  Vitals:   09/04/17 1256  BP: 138/81  Pulse: 72  Resp: 18  Temp: 97.7 F (36.5 C)  SpO2: 98%    Weight: 220 lb (99.8 kg)  Height: 5\' 3"  (1.6 m)    Body mass index is 38.97 kg/m.  General:  Alert and oriented, no acute distress HEENT: Normal Neck: No bruit or JVD Pulmonary: Clear to auscultation bilaterally Cardiac: Regular Rate and Rhythm without murmur Abdomen: Soft, non-tender, non-distended, no mass, no scars Skin: No rash Extremity Pulses:  2+ radial, brachial, femoral, dorsalis pedis,  pulses bilaterally Musculoskeletal: No deformity,  minimal edema B LE   Neurologic: Upper and lower extremity motor 5/5 and symmetric  DATA:  ABI's Left Triphasic flow 1.07, patent bypass Anastomosis velocity 266 cm/s with flow through out 79-134.  TBI 0.79 Right triphasic flow 1.08 TBI 0.75  ASSESSMENT:   S/P Left common femoral to popliteal (above-knee) bypass using non-reversed translocated saphenous vein graft from left leg 2014 By Dr. Kellie Simmering   PLAN:   She has stopped smoking and has no symptoms of claudication.  She reports no recurrent ulcers.  Sh has left lower leg pain to touch, this has not changed in 4 years.  Her bypass is patent.  We will schedule her to return for repeat ABI's and left LE arterial bypass duplex.     Theda Sers, Jaeleen Inzunza [facility] PA-C [facility]  MD in clinic Dr. Lindley Magnus

## 2017-09-05 NOTE — Addendum Note (Signed)
Addended by: Lianne Cure A on: 09/05/2017 10:55 AM   Modules accepted: Orders

## 2017-09-08 ENCOUNTER — Other Ambulatory Visit: Payer: Self-pay | Admitting: Hematology

## 2017-09-08 DIAGNOSIS — Z1231 Encounter for screening mammogram for malignant neoplasm of breast: Secondary | ICD-10-CM

## 2017-09-23 ENCOUNTER — Telehealth: Payer: Self-pay

## 2017-09-23 NOTE — Telephone Encounter (Signed)
I called pt and reminded her to bring her cpap to her appt tomorrow. Pt advised me that she will not be bringing her cpap tomorrow because no one has ever checked it.

## 2017-09-24 ENCOUNTER — Encounter: Payer: Self-pay | Admitting: Neurology

## 2017-09-24 ENCOUNTER — Ambulatory Visit (INDEPENDENT_AMBULATORY_CARE_PROVIDER_SITE_OTHER): Payer: Medicare Other | Admitting: Neurology

## 2017-09-24 ENCOUNTER — Encounter: Payer: Self-pay | Admitting: Psychology

## 2017-09-24 VITALS — BP 170/98 | HR 70 | Ht 63.0 in | Wt 222.0 lb

## 2017-09-24 DIAGNOSIS — R51 Headache: Secondary | ICD-10-CM

## 2017-09-24 DIAGNOSIS — R030 Elevated blood-pressure reading, without diagnosis of hypertension: Secondary | ICD-10-CM | POA: Diagnosis not present

## 2017-09-24 DIAGNOSIS — R419 Unspecified symptoms and signs involving cognitive functions and awareness: Secondary | ICD-10-CM

## 2017-09-24 DIAGNOSIS — G4733 Obstructive sleep apnea (adult) (pediatric): Secondary | ICD-10-CM

## 2017-09-24 DIAGNOSIS — R519 Headache, unspecified: Secondary | ICD-10-CM

## 2017-09-24 DIAGNOSIS — I779 Disorder of arteries and arterioles, unspecified: Secondary | ICD-10-CM | POA: Diagnosis not present

## 2017-09-24 DIAGNOSIS — F39 Unspecified mood [affective] disorder: Secondary | ICD-10-CM | POA: Diagnosis not present

## 2017-09-24 NOTE — Patient Instructions (Signed)
You could not tolerate BiPAP for sleep apnea and no longer use the machine.  I referred you to ENT last year, and you were advised to use your BiPAP and the only alternative surgically maybe a tracheostomy, which you did not want to pursue, understandably.  Your memory scores are stable, nevertheless, I suggest we do a formal neuropsychological test (aka cognitive testing) for your memory complaints. This requires a referral to a trained and licensed neuropsychologist and will be a separate appointment at a different clinic.  We did a brain MRI in 2016.  For depression and your concern for visual hallucinations, I would advise you seek consultation with psychiatry. Please talk to your primary care physician.  At this time, I will order BiPAP supplies as requested.  Please use your BiPAP regularly. While your insurance requires that you use PAP at least 4 hours each night on 70% of the nights, I recommend, that you not skip any nights and use it throughout the night if you can. Getting used to BiPAP and staying with the treatment long term does take time and patience and discipline. Untreated obstructive sleep apnea when it is moderate to severe can have an adverse impact on cardiovascular health and raise her risk for heart disease, arrhythmias, hypertension, congestive heart failure, stroke and diabetes. Untreated obstructive sleep apnea causes sleep disruption, nonrestorative sleep, and sleep deprivation. This can have an impact on your day to day functioning and cause daytime sleepiness and impairment of cognitive function, memory loss, mood disturbance, and problems focussing. Using BiPAP regularly can improve these symptoms.

## 2017-09-24 NOTE — Progress Notes (Signed)
Subjective:    Walker ID: Whitney Walker is a 61 y.o. female.  HPI     Interim history:   Whitney Walker is a 61 year old right-handed woman with a complex medical history of hypertension, obesity, congestive heart failure, chronic kidney disease, COPD, peripheral vascular disease, DVT and PE in 2014, prior smoking and secondhand smoke exposure, who presents for follow-up consultation of Whitney Walker memory loss and obstructive sleep apnea, with prior non-compliance with BiPAP therapy and current noncompliance. Whitney Walker is unaccompanied today. Whitney Walker last saw Whitney Walker on 07/31/2016, which time Whitney Walker was noncompliant with Whitney Walker BiPAP. MMSE was 28. Whitney Walker made a referral to ENT for sleep apnea surgical evaluation.  Whitney Walker was seen by Whitney Walker on 08/27/2016, at which time he felt that BiPAP was Whitney Walker best treatment option with Whitney only surgical alternative of a trach, which Walker did not want to pursue.  Whitney Walker was seen in follow-up by Whitney Walker, Whitney Walker on 03/19/2017, at which time Whitney Walker reported worsening depression, Whitney Walker was no longer using Whitney Walker BiPAP. Whitney Walker was advised to follow-up with Whitney Walker mental health provider and PCP as soon as possible. Whitney Walker voiced specifically no intent for suicide. Whitney Walker MMSE was 22 out of 30 at Whitney time.  Today, 09/24/2017 (all dictated new, as well as above notes, some dictation done in note pad or Word, outside of chart, may appear as copied):   Whitney Walker reports recurrent dull headaches, sensation of pressure. Whitney Walker has had depression and has had some VH, no SI. Whitney Walker sleeps in a recliner. Whitney Walker has had elevated BP values. Whitney Walker saw a dentist for an OA, but does not remember who. Does have dentures. Whitney Walker does report depressive symptoms. Whitney Walker reports that Whitney Walker does not see a psychiatrist. Whitney Walker also reports that Whitney Walker stopped Whitney Walker medications and did not take a certain blood pressure medication for fear of side effects. A brain MRI in May 2016 showed advanced for age white matter changes.   Whitney Walker's allergies,  current medications, family history, past medical history, past social history, past surgical history and problem list were reviewed and updated as appropriate.    Previously:    Whitney Walker saw Whitney Walker on 02/05/2016 at which time Whitney Walker was no longer using Whitney Walker BiPAP. Whitney Walker reported a lot of recent stressors. Whitney Walker presented to Whitney emergency room in October 2017 with shortness of breath, Whitney Walker was admitted to [facility] hospital from 10/17/2015 through 10/25/2015, was diagnosed with left leg DVT and PE. Whitney Walker also reported an increase in Whitney Walker depression, for which Whitney Walker was started on Effexor long-acting. Whitney Walker reported that Whitney Walker was taking care of a 36-monthold great-grandson and therefore was not using Whitney Walker BiPAP but Whitney Walker [place] Walker was on Xarelto, then ASA, then coumadin, then Eliquis (per Dr. GEinar Gip. Whitney Walker was worried about Whitney Walker younger sister and Whitney Walker health issues. Walker underwent a recent breast biopsy.  We talked about Whitney importance of medication compliance and treatment compliance with BiPAP. Whitney Walker prescribed mask for Whitney Walker BiPAP machine and a mask refit through Whitney Walker DME.   Whitney Walker saw Whitney Walker on 10/02/2015, at which time Whitney Walker reported trouble tolerating BiPAP. Whitney Walker felt he was uncomfortable. Whitney Walker also reported stress, Whitney Walker blood pressure was higher as well. Whitney Walker was having headaches. Whitney Walker was not fully compliant with BiPAP therapy. Whitney Walker never started Whitney Walker new diabetes medication Whitney Walker reported. Whitney Walker also said Whitney Walker needed to move. On Whitney positive side, Whitney Walker felt improved and Whitney Walker daytime energy and nocturia after starting BiPAP therapy.    Whitney Walker  reviewed Whitney Walker BiPAP compliance data from 12/04/2015 through 01/02/2016 which is a total of 30 days during which time Whitney Walker used Whitney Walker machine only 2 days, indicating noncompliance.   Whitney Walker first met Whitney Walker on 05/04/2015 at Whitney request of Whitney Walker primary care provider, at which time Whitney Walker reported memory loss for Whitney past 6 months. Whitney Walker reported forgetfulness. Whitney Walker also reported a prior diagnosis of obstructive sleep  apnea and using oxygen in Whitney past and Whitney Walker had discontinued both CPAP treatment and oxygen usage at home. Whitney Walker used to live in [place] and moved to [place] to be closer to Whitney Walker children. Whitney Walker had had recent blood work. Whitney Walker MMSE was 27 out of 30, clock drawing was 2 out of 4, animal fluency was 10/m. Whitney Walker had vascular risk factors. Whitney Walker suggested Whitney Walker work with Whitney Walker primary care physician to optimize Whitney Walker hypertension, lipid management and Whitney Walker was also supposed to start a new diabetes medication. Whitney Walker suggested we proceed with a brain MRI without contrast and a sleep study. Whitney Walker had a split-night sleep study on 05/22/2015 and Whitney Walker went over Whitney Walker test results with Whitney Walker in detail today. Baseline sleep efficiency was severely reduced at 28.1% with a latency to sleep prolonged at 67 minutes and wake after sleep onset long at 86.5 minutes with severe sleep fragmentation noted. Whitney Walker had a markedly elevated arousal index. Whitney Walker had absence of slow-wave sleep and near absence of REM sleep with less than 1% REM sleep. Due to prolonged REM latency. Moderate snoring was noted. Whitney Walker slept almost exclusively on Whitney Walker back. Total AHI was highly elevated at 77 per hour, baseline oxygen saturation 94%, nadir was 87%. Whitney Walker was then titrated on positive airway pressure. Sleep efficiency was 96.5%, latency to sleep only 2.5 minutes and wake after sleep onset only 6 minutes with mild sleep fragmentation noted. Whitney Walker had 23.9% of slow-wave sleep and an increased percentage of REM sleep at 45.2%. Average oxygen saturation was 93%, nadir was 72% during REM sleep. Whitney Walker was initially started on CPAP at a pressure of 5 cm and gradually increased to 15 cm. Due to residual sleep disordered breathing noted, Whitney Walker was switched to BiPAP therapy starting at 14/10 cm with gradual increase to 19/14 cm. AHI was 7.3 per hour on Whitney final pressure with supine REM sleep achieved. Oxygen desaturation nadir on Whitney final BiPAP pressure was 90%. Based on Whitney Walker test results Whitney Walker  prescribed standard BiPAP therapy for home use at a pressure of 19/14 cm.   Whitney Walker had a brain MRI without contrast on 05/08/2015: This is an abnormal MRI of Whitney brain without contrast showing white matter changes most compatible with small vessel ischemic changes. Whitney extent is more than expected for age. There are no acute findings.   In addition, Whitney Walker personally reviewed Whitney images through Whitney PACS system.   We called Whitney Walker with Whitney Walker test results.   Whitney Walker reviewed Whitney Walker BiPAP compliance data from 08/29/2015 through 09/27/2015 which is a total of 30 days during which time Whitney Walker used Whitney Walker machine 21 days with percent used days greater than 4 hours of only 67%, indicating mildly suboptimal compliance with an average usage of 4 hours and 46 minutes, residual AHI suboptimal at 12.4 per hour, leak acceptable with Whitney 95th percentile at 13.5 L/m on a pressure of 19/14 cm standard BiPAP. Of note, Whitney Walker was compliant with a compliance percentage of 74% when Whitney Walker reviewed Whitney Walker compliance data from 08/09/2015 through 09/27/2015 which is a total of 50 days.   05/04/2015: Whitney Walker  reports memory loss in Whitney last 6 months.  Whitney Walker reports primarily forgetfulness. Whitney Walker moved from [place] in 2009 to be closer to Whitney Walker children. Whitney Walker has a son and a daughter. Whitney Walker has grandchildren and great-grandchildren. Whitney Walker lives alone. Whitney Walker is currently not driving as Whitney Walker does not have a car.   Whitney Walker reviewed your office note from 04/05/2015 which you kindly included. Also reviewed laboratory test results from your office from 04/05/2015: Hemoglobin A1c was 6.5, total cholesterol 229, LDL 128, triglycerides 253. Whitney Walker blood pressure at Whitney time was 160/80. BMI 45.    Whitney Walker has been seen by Whitney vascular and vein specialists . Whitney Walker reviewed their office note from 01/24/2015. Whitney Walker had left femoropopliteal vein graft performed in August 2014. Whitney Walker had a PE in November 2014 and was started on Xarelto. Whitney Walker is now on ASA. Whitney Walker has seen Dr. Einar Gip in cardiology. Whitney Walker has  seen oncology for anemia and leukocytosis. Whitney Walker reviewed Whitney office note from Dr. Burr Medico from 01/11/2015.   Whitney Walker had a head CT without contrast and CT cervical spine without contrast on 10/31/2011: 1. Normal noncontrast CT appearance of Whitney brain. In addition, personally reviewed Whitney images through Whitney PACS system and agree with Whitney findings. 1.  Negative neck soft tissues except for left greater than right carotid calcified atherosclerosis. 2.  Evidence of pulmonary artery hypertension in Whitney visualized chest (main PA 36 mm in diameter versus ascending aorta 32 mm).   Whitney Walker had a CTH without contrast on 03/09/14: this was reported as negative.  Whitney Walker personally reviewed Whitney images through Whitney PACS system and agree with Whitney findings.    Whitney Walker was diagnosed with OSA years ago when Whitney Walker was still in [place], but gave Whitney Walker CPAP machine to Whitney Walker daughter and Whitney Walker reports that both Whitney Walker son and daughter have severe OSA. Whitney Walker had an O2 tank at home, but no longer has it. Whitney Walker has not used CPAP in some years. Whitney Walker sleep study results are not available for reviewed.    Whitney Walker is not on cholesterol medication. Whitney Walker is supposed to start a diabetes medication, but has not picked up Whitney prescription yet.    Whitney Walker reports snoring, gasping sensations in sleep, morning headaches, nocturia x 3-4 per night.   Whitney Walker denies a family history of dementia. Whitney Walker denies Hallucinations or delusions.    Whitney Walker Past Medical History Is Significant For: Past Medical History:  Diagnosis Date  . Anemia   . Anxiety   . Arthritis   . CHF (congestive heart failure) ([place])   . CKD (chronic kidney disease)    Stage III 03/2013  . Colon cancer ([place])   . COPD (chronic obstructive pulmonary disease) ([place])   . Coronary artery disease   . Depression   . Hypertension   . Meningitis   . PE (pulmonary embolism)   . Peripheral vascular disease ([place])   . Shortness of breath   . Sleep apnea   . Vertigo     Whitney Walker Past Surgical History Is Significant  For: Past Surgical History:  Procedure Laterality Date  . ABDOMINAL AORTAGRAM N/A 07/26/2013   Procedure: ABDOMINAL Maxcine Ham;  Surgeon: [name] Tasley, MD;  Location: [facility] CATH LAB;  Service: Cardiovascular;  Laterality: N/A;  . ABDOMINAL HYSTERECTOMY    . CESAREAN SECTION     X 3 [id number]  . CHOLECYSTECTOMY    . CHOLECYSTECTOMY  1980/s  . COLECTOMY  2010  . DIVERTICULOSIS SURGERY-2002  2012  . FEMORAL-POPLITEAL BYPASS GRAFT Left 08/13/2013  Procedure: BYPASS GRAFT FEMORAL-POPLITEAL ARTERY WITH NON-REVERSED SAPHANEOUS VEIN; ULTRASOUND GUIDED;  Surgeon: Mal [name], MD;  Location: [facility];  Service: Vascular;  Laterality: Left;  . LOWER EXTREMITY ANGIOGRAM Left 07/26/2013   Procedure: LOWER EXTREMITY ANGIOGRAM;  Surgeon: [name] Mendon, MD;  Location: [facility] CATH LAB;  Service: Cardiovascular;  Laterality: Left;    Whitney Walker Family History Is Significant For: Family History  Problem Relation Age of Onset  . Cancer Mother 8       breast and bone  . Cancer Father 71       prostate  . Hypertension Sister   . Bleeding Disorder Sister   . Cancer Cousin 20       breast cancer   . Hypertension Daughter     Whitney Walker Social History Is Significant For: Social History   Social History  . Marital status: Single    Spouse name: N/A  . Number of children: 4  . Years of education: 12th gd   Occupational History  . N/A    Social History Main Topics  . Smoking status: Former Smoker    Packs/day: 1.50    Years: 40.00    Types: Cigarettes    Quit date: 08/09/2014  . Smokeless tobacco: Current User  . Alcohol use No     Comment: Whitney Walker used to drink alcohol, quit in 2010   . Drug use: No  . Sexual activity: Not Asked   Other Topics Concern  . None   Social History Narrative   2 cups of coffee a day     Whitney Walker Allergies Are:  Allergies  Allergen Reactions  . Gadolinium Derivatives Hives and Other (See Comments)    HIVES, Desc: HIVES W/ "DYE" USED FOR 1ST CT SCAN BUT NOT 2ND, NO PREMEDS USED, PT  UNCERTAIN OF CIRCUMSTANCES,,?POSSIBLE MRI CONTRAST ALLERGY, ALL STUDIES DONE "SOMEWHERE" IN [place]//A.C., Onset Date: [id number]  . Iohexol Other (See Comments)     Code: HIVES, Desc: HIVES W/ "DYE" USED FOR 1ST CT SCAN BUT NOT 2ND, NO PREMEDS USED, PT UNCERTAIN OF CIRCUMSTANCES,,?POSSIBLE MRI CONTRAST ALLERGY, ALL STUDIES DONE "SOMEWHERE" IN [place]//A.C., Onset Date: [id number]   :   Whitney Walker Current Medications Are:  Outpatient Encounter Prescriptions as of 09/24/2017  Medication Sig  . amLODipine (NORVASC) 10 MG tablet Take 10 mg by mouth daily.  [name] apixaban (ELIQUIS) 2.5 MG TABS tablet Take 1 tablet (2.5 mg total) by mouth 2 (two) times daily.  . Ascorbic Acid (VITAMIN C) 1000 MG tablet Take 1,000 mg by mouth every other day.  . IRON PO Take by mouth daily.  . NYSTATIN powder APPLY TO AFFECTED AREA BID  . [DISCONTINUED] albuterol (PROVENTIL HFA;VENTOLIN HFA) 108 (90 BASE) MCG/ACT inhaler Inhale 2 puffs into Whitney lungs every 6 (six) hours as needed for wheezing or shortness of breath.  . [DISCONTINUED] BREO ELLIPTA 100-25 MCG/INH AEPB Inhale 1 puff into Whitney lungs daily.  . [DISCONTINUED] cetirizine (ZYRTEC) 10 MG tablet TK 1 T PO QD  . [DISCONTINUED] ferrous sulfate 325 (65 FE) MG EC tablet Take 1 tablet (325 mg total) by mouth 3 (three) times daily with meals.  . [DISCONTINUED] mometasone (NASONEX) 50 MCG/ACT nasal spray Place 2 sprays into Whitney nose daily.  . [DISCONTINUED] nebivolol (BYSTOLIC) 10 MG tablet Take 1 tablet (10 mg total) by mouth daily. (Walker not taking: Reported on 03/19/2017)  . [DISCONTINUED] Nebivolol HCl (BYSTOLIC) 20 MG TABS Take 1 tablet (20 mg total) by mouth daily. (Walker not taking: Reported on 07/07/2017)  No facility-administered encounter medications on file as of 09/24/2017.   :  Review of Systems:  Out of a complete 14 point review of systems, all are reviewed and negative with Whitney exception of these symptoms as listed below:  Review of Systems   Neurological:       Pt presents today to discuss Whitney Walker memory, cpap, and headaches.    Objective:  Neurological Exam  Physical Exam Physical Examination:   Vitals:   09/24/17 1351  BP: (!) 170/98  Pulse: 70   General Examination: Whitney Walker is a very pleasant 61 y.o. female in no acute distress. Whitney Walker appears well-developed and well-nourished and well groomed. Intermittently tearful.   HEENT: Normocephalic, atraumatic, pupils are equal, round and reactive to light and accommodation. Extraocular tracking shows no saccadic breakdown without nystagmus noted. Hearing is intact. Face is symmetric with no facial masking and normal facial sensation. There is no lip, neck or jaw tremor. Neck is not rigid with intact passive ROM. Oropharynx exam reveals moderate mouth dryness. Whitney Walker has edentulous. Whitney Walker has a moderately crowded airway d/t thicker tongue, redundant soft palate and larger uvula. Tonsils are absent. Mallampati is class II. Tongue protrudes centrally and palate elevates symmetrically.    Chest: is clear to auscultation without wheezing, rhonchi or crackles noted.  Heart: sounds are regular and normal without murmurs, rubs or gallops noted.   Abdomen: is soft, non-tender and non-distended with normal bowel sounds appreciated on auscultation.  Extremities: There is 1+ edema.   Skin: is warm and dry with no trophic changes noted.   Musculoskeletal: exam reveals no obvious joint deformities, tenderness or joint swelling or erythema.   Neurologically:  Mental status: Whitney Walker is awake and alert, paying good attention. Whitney Walker is able to completely provide Whitney history. Whitney Walker is oriented to: person, place, time/date, situation, day of week, month of year and year. Whitney Walker memory, attention, language and knowledge are mildly impaired. There is no aphasia, agnosia, apraxia or anomia. There is a no bradyphrenia. Speech is not hypophonic with no dysarthria noted. Mood is congruent and affect is  blunted.   On 05/04/2015: MMSE: 27/30, CDT: 2/4, AFT 10/min.  On 02/05/2016: MMSE: 28/30, CDT: 3/4, AFT: 9/min.   07/31/2016: MMSE: 28/30, CDT: 3/4, AFT: 9/min.  On 03/19/17: MMSE: 22/30.   On 09/24/2017: MMSE: 28/30, CDT: 3/4, AFT: 12/min.   Cranial nerves are as described above under HEENT exam. In addition, shoulder shrug is normal with equal shoulder height noted.  Motor exam: Normal bulk, and strength for age is noted. Tone is not rigid with absence of cogwheeling. There is no resting/postural or action tremor.   Reflexes are 2+ in Whitney upper extremities and lower extremities. Fine motor skills: grossly intact.   Cerebellar testing shows no dysmetria or intention tremor. There is no truncal or gait ataxia.   Sensory exam is intact to light touch in Whitney upper and lower extremities.   Gait, station and balance: Whitney Walker stands up from Whitney seated position with no difficulty. No veering to one side is noted. No leaning to one side. Posture is age-appropriate. Stance is narrow-based. Gait and balance are unremarkable.   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 Whitney Walker physical exam is stable. Whitney Walker memory scores have been stable. Unfortunately, Whitney Walker'm not quite sure as to how to help Whitney Walker any better at this time. Whitney Walker believe Whitney Walker has symptoms from untreated OSA including elevated blood pressure values, pressure-like headaches, cognitive complaints. Whitney Walker has suboptimally treated depression and has not been seeing psychiatry. Whitney Walker has not addressed this with Whitney Walker  primary care physician either as Whitney Walker reports. Whitney Walker is strongly encouraged to follow-up with Whitney Walker primary care physician for blood pressure management and mood disorder management, potential referral to psychiatry as Whitney Walker also reports occasional visual hallucinations. Whitney Walker had previously had a very long conversation a more than one occasion with Whitney Walker regarding Whitney Walker untreated sleep apnea, we did a referral to ENT last year and Whitney Walker saw Whitney Walker, was advised to use Whitney Walker BiPAP and Whitney only valid treatment option from Whitney surgical standpoint would be a tracheostomy which understandably Whitney Walker does not wish to pursue. Nevertheless, Whitney Walker am not quite sure where to go from here with Whitney Walker. Whitney Walker would recommend neuropsychological evaluation which may help Whitney Walker understand Whitney different aspects of memory loss. Whitney Walker reports that Whitney Walker does not have any BiPAP related supplies. Whitney Walker placed an order for this but even when Whitney Walker had supplies available in Whitney past Whitney Walker was not using Whitney Walker BiPAP.  Whitney Walker suggested a six-month recheck with Whitney Walker. Whitney Walker reports that Whitney Walker had previously seen a dentist for an oral appliance. Whitney Walker does not recall his name. Whitney Walker would have to pay out of pocket as Whitney Walker remembers. Whitney Walker is encouraged to see him back as well. Whitney Walker was told that Whitney Walker could use an oral appliance fitted over Whitney Walker dentures. Whitney Walker is not currently wearing Whitney Walker dentures. Whitney Walker spent 25 minutes in total face-to-face time with Whitney Walker, more than 50% of which was spent in counseling and coordination of care, reviewing test results, reviewing medication and discussing or reviewing Whitney diagnosis of OSA, cognitive complaints, elevated BP values, Whitney prognosis and treatment options. Pertinent laboratory and imaging test results that were available during this visit with Whitney Walker were reviewed by me and considered in my medical decision making (see chart for details).

## 2017-10-22 DIAGNOSIS — R0609 Other forms of dyspnea: Secondary | ICD-10-CM | POA: Diagnosis not present

## 2017-10-22 DIAGNOSIS — Z86711 Personal history of pulmonary embolism: Secondary | ICD-10-CM | POA: Diagnosis not present

## 2017-10-22 DIAGNOSIS — R0989 Other specified symptoms and signs involving the circulatory and respiratory systems: Secondary | ICD-10-CM | POA: Diagnosis not present

## 2017-10-22 DIAGNOSIS — R0789 Other chest pain: Secondary | ICD-10-CM | POA: Diagnosis not present

## 2017-11-06 DIAGNOSIS — N184 Chronic kidney disease, stage 4 (severe): Secondary | ICD-10-CM | POA: Diagnosis not present

## 2017-11-06 DIAGNOSIS — N183 Chronic kidney disease, stage 3 (moderate): Secondary | ICD-10-CM | POA: Diagnosis not present

## 2017-11-06 DIAGNOSIS — R7309 Other abnormal glucose: Secondary | ICD-10-CM | POA: Diagnosis not present

## 2017-11-06 DIAGNOSIS — I129 Hypertensive chronic kidney disease with stage 1 through stage 4 chronic kidney disease, or unspecified chronic kidney disease: Secondary | ICD-10-CM | POA: Diagnosis not present

## 2017-11-06 DIAGNOSIS — E782 Mixed hyperlipidemia: Secondary | ICD-10-CM | POA: Diagnosis not present

## 2017-11-06 DIAGNOSIS — G4730 Sleep apnea, unspecified: Secondary | ICD-10-CM | POA: Diagnosis not present

## 2017-11-27 DIAGNOSIS — Z720 Tobacco use: Secondary | ICD-10-CM | POA: Diagnosis not present

## 2017-11-27 DIAGNOSIS — E669 Obesity, unspecified: Secondary | ICD-10-CM | POA: Diagnosis not present

## 2017-11-27 DIAGNOSIS — N183 Chronic kidney disease, stage 3 (moderate): Secondary | ICD-10-CM | POA: Diagnosis not present

## 2017-11-27 DIAGNOSIS — E785 Hyperlipidemia, unspecified: Secondary | ICD-10-CM | POA: Diagnosis not present

## 2017-11-27 DIAGNOSIS — D631 Anemia in chronic kidney disease: Secondary | ICD-10-CM | POA: Diagnosis not present

## 2017-12-02 ENCOUNTER — Other Ambulatory Visit: Payer: Self-pay

## 2017-12-02 DIAGNOSIS — N185 Chronic kidney disease, stage 5: Secondary | ICD-10-CM

## 2017-12-02 DIAGNOSIS — Z01810 Encounter for preprocedural cardiovascular examination: Secondary | ICD-10-CM

## 2017-12-03 ENCOUNTER — Encounter (HOSPITAL_COMMUNITY): Payer: Self-pay

## 2017-12-03 ENCOUNTER — Other Ambulatory Visit (HOSPITAL_COMMUNITY): Payer: Self-pay

## 2017-12-05 ENCOUNTER — Encounter: Payer: Self-pay | Admitting: Vascular Surgery

## 2017-12-12 ENCOUNTER — Ambulatory Visit (HOSPITAL_COMMUNITY)
Admission: RE | Admit: 2017-12-12 | Discharge: 2017-12-12 | Disposition: A | Discharge status: Home / Self Care | Payer: Medicare Other | Source: Ambulatory Visit | Attending: Vascular Surgery | Admitting: Vascular Surgery

## 2017-12-12 ENCOUNTER — Ambulatory Visit (INDEPENDENT_AMBULATORY_CARE_PROVIDER_SITE_OTHER): Payer: Medicare Other | Admitting: Vascular Surgery

## 2017-12-12 ENCOUNTER — Encounter: Payer: Self-pay | Admitting: Vascular Surgery

## 2017-12-12 ENCOUNTER — Ambulatory Visit (INDEPENDENT_AMBULATORY_CARE_PROVIDER_SITE_OTHER)
Admission: RE | Admit: 2017-12-12 | Discharge: 2017-12-12 | Disposition: A | Discharge status: Home / Self Care | Payer: Medicare Other | Source: Ambulatory Visit | Attending: Vascular Surgery | Admitting: Vascular Surgery

## 2017-12-12 VITALS — BP 130/80 | HR 60 | Temp 97.1°F | Resp 16 | Ht 63.0 in | Wt 216.5 lb

## 2017-12-12 DIAGNOSIS — Z01810 Encounter for preprocedural cardiovascular examination: Secondary | ICD-10-CM

## 2017-12-12 DIAGNOSIS — N185 Chronic kidney disease, stage 5: Secondary | ICD-10-CM

## 2017-12-12 DIAGNOSIS — N184 Chronic kidney disease, stage 4 (severe): Secondary | ICD-10-CM

## 2017-12-12 DIAGNOSIS — I779 Disorder of arteries and arterioles, unspecified: Secondary | ICD-10-CM

## 2017-12-12 NOTE — Progress Notes (Signed)
Patient ID: Whitney Walker, female   DOB: 05/11/1956, 61 y.o.   MRN: [id number]  Reason for Consult: New Patient (Initial Visit) (Stage 5 eval for avf referred by Dr. Edrick Oh)   Referred by Glendale Chard, MD  Subjective:     HPI:  Whitney Walker is a 61 y.o. female with chronic kidney disease stage V.  She has never been on dialysis.  She has been watched for which she says her multiple years without the need.  She presents today with vein mapping for possible access creation.  She does have weakness in her left arm and left leg is unsure that she has had a stroke before.  Chief complaint is shortness of breath with activity and occasionally at rest.  She is right-hand dominant.  She takes Eliquis for history of DVT.  She has had a left common femoral to above-knee popliteal bypass in the past which was evaluated with duplex in September.  Past Medical History:  Diagnosis Date  . Anemia   . Anxiety   . Arthritis   . CHF (congestive heart failure) ([place])   . CKD (chronic kidney disease)    Stage III 03/2013  . Colon cancer ([place])   . COPD (chronic obstructive pulmonary disease) ([place])   . Coronary artery disease   . Depression   . Hypertension   . Meningitis   . PE (pulmonary embolism)   . Peripheral vascular disease ([place])   . Shortness of breath   . Sleep apnea   . Vertigo    Family History  Problem Relation Age of Onset  . Cancer Mother 54       breast and bone  . Cancer Father 69       prostate  . Hypertension Sister   . Bleeding Disorder Sister   . Cancer Cousin 20       breast cancer   . Hypertension Daughter    Past Surgical History:  Procedure Laterality Date  . ABDOMINAL AORTAGRAM N/A 07/26/2013   Procedure: ABDOMINAL Maxcine Ham;  Surgeon: [name] Hometown, MD;  Location: [facility] CATH LAB;  Service: Cardiovascular;  Laterality: N/A;  . ABDOMINAL HYSTERECTOMY    . CESAREAN SECTION     X 3 [id number]  . CHOLECYSTECTOMY    . CHOLECYSTECTOMY  1980/s  . COLECTOMY  2010    . DIVERTICULOSIS SURGERY-2002  2012  . FEMORAL-POPLITEAL BYPASS GRAFT Left 08/13/2013   Procedure: BYPASS GRAFT FEMORAL-POPLITEAL ARTERY WITH NON-REVERSED SAPHANEOUS VEIN; ULTRASOUND GUIDED;  Surgeon: Mal [name], MD;  Location: [place];  Service: Vascular;  Laterality: Left;  . LOWER EXTREMITY ANGIOGRAM Left 07/26/2013   Procedure: LOWER EXTREMITY ANGIOGRAM;  Surgeon: [name] Granby, MD;  Location: [facility] CATH LAB;  Service: Cardiovascular;  Laterality: Left;    Short Social History:  Social History   Tobacco Use  . Smoking status: Former Smoker    Packs/day: 1.50    Years: 40.00    Pack years: 60.00    Types: Cigarettes    Last attempt to quit: 08/09/2014    Years since quitting: 3.3  . Smokeless tobacco: Current User  Substance Use Topics  . Alcohol use: No    Alcohol/week: 0.0 oz    Comment: she used to drink alcohol, quit in 2010     Allergies  Allergen Reactions  . Gadolinium Derivatives Hives and Other (See Comments)    HIVES, Desc: HIVES W/ "DYE" USED FOR 1ST CT SCAN BUT NOT 2ND, NO PREMEDS USED, PT UNCERTAIN OF  CIRCUMSTANCES,,?POSSIBLE MRI CONTRAST ALLERGY, ALL STUDIES DONE "SOMEWHERE" IN [place]//A.C., Onset Date: [id number]  . Iohexol Other (See Comments)     Code: HIVES, Desc: HIVES W/ "DYE" USED FOR 1ST CT SCAN BUT NOT 2ND, NO PREMEDS USED, PT UNCERTAIN OF CIRCUMSTANCES,,?POSSIBLE MRI CONTRAST ALLERGY, ALL STUDIES DONE "SOMEWHERE" IN [place]//A.C., Onset Date: [id number]     Current Outpatient Medications  Medication Sig Dispense Refill  . amLODipine (NORVASC) 10 MG tablet Take 10 mg by mouth daily.    [name] apixaban (ELIQUIS) 2.5 MG TABS tablet Take 1 tablet (2.5 mg total) by mouth 2 (two) times daily. 60 tablet 5  . Ascorbic Acid (VITAMIN C) 1000 MG tablet Take 1,000 mg by mouth every other day.    . IRON PO Take by mouth daily.    . NYSTATIN powder APPLY TO AFFECTED AREA BID  2   No current facility-administered medications for this visit.     Review of  Systems  Constitutional: Positive for chills.  HENT: HENT negative.  Eyes: Eyes negative.  Respiratory: Positive for shortness of breath.  Cardiovascular: Cardiovascular negative. Positive for dyspnea with exertion.  GI: Gastrointestinal negative.  Musculoskeletal: Musculoskeletal negative.  Skin: Skin negative.  Neurological: Neurological negative. Hematologic: Hematologic/lymphatic negative.  Psychiatric: Psychiatric negative.        Objective:  Objective   Vitals:   12/12/17 1243  BP: 130/80  Pulse: 60  Resp: 16  Temp: (!) 97.1 F (36.2 C)  TempSrc: Oral  SpO2: 98%  Weight: 216 lb 8 oz (98.2 kg)  Height: 5\' 3"  (1.6 m)   Body mass index is 38.35 kg/m.  Physical Exam  Constitutional: She is oriented to person, place, and time. She appears well-developed.  HENT:  Head: Normocephalic.  Eyes: Pupils are equal, round, and reactive to light.  Neck: Normal range of motion.  Cardiovascular: Normal rate.  Pulses:      Radial pulses are 2+ on the right side, and 2+ on the left side.  Pulmonary/Chest: Effort normal.  Abdominal: Soft.  Neurological: She is alert and oriented to person, place, and time.  Skin: Skin is warm and dry.  Psychiatric: She has a normal mood and affect. Her behavior is normal. Judgment and thought content normal.    Data: I have independently interpreted her bilateral upper extremity duplexes which have triphasic waveforms bilaterally.  Right brachial artery 0.5 cm left is 0.35 cm.  Next  I have also interpreted her upper extremity vein mapping.  There is marginal basilic and cephalic vein on the left for creation of fistula.     Assessment/Plan:     61 year old female presents for evaluation of permanent access prior to needing dialysis.  She has multiple questions requiring the need for dialysis which I will have to defer to her primary nephrologist Dr. [name].  I have discussed the options for dialysis being catheter, graft, fistula and have  offered to proceed with either fistula or graft placement depending on the caliber of the vein.  We discussed the risks and benefits of each in detail and that she would need to be off of Eliquis for 2 days prior to any of these procedures.  At this time she is unwilling to proceed with procedure until she has more questions answered by her nephrologist.  When she gets her questions answered she can call to schedule for follow-up in the office to schedule if she would like to.     Waynetta Sandy MD [facility]

## 2017-12-16 ENCOUNTER — Encounter: Payer: Self-pay | Admitting: Nephrology

## 2018-01-07 ENCOUNTER — Telehealth: Payer: Self-pay | Admitting: Hematology

## 2018-01-07 ENCOUNTER — Inpatient Hospital Stay: Payer: Medicare Other

## 2018-01-07 ENCOUNTER — Inpatient Hospital Stay: Payer: Medicare Other | Attending: Hematology | Admitting: Hematology

## 2018-01-07 ENCOUNTER — Encounter: Payer: Self-pay | Admitting: Hematology

## 2018-01-07 VITALS — BP 156/72 | HR 70 | Temp 98.4°F | Resp 20 | Ht 63.0 in | Wt 213.4 lb

## 2018-01-07 DIAGNOSIS — D509 Iron deficiency anemia, unspecified: Secondary | ICD-10-CM | POA: Diagnosis not present

## 2018-01-07 DIAGNOSIS — I509 Heart failure, unspecified: Secondary | ICD-10-CM | POA: Insufficient documentation

## 2018-01-07 DIAGNOSIS — Z86711 Personal history of pulmonary embolism: Secondary | ICD-10-CM

## 2018-01-07 DIAGNOSIS — D631 Anemia in chronic kidney disease: Secondary | ICD-10-CM

## 2018-01-07 DIAGNOSIS — Z86718 Personal history of other venous thrombosis and embolism: Secondary | ICD-10-CM | POA: Insufficient documentation

## 2018-01-07 DIAGNOSIS — I129 Hypertensive chronic kidney disease with stage 1 through stage 4 chronic kidney disease, or unspecified chronic kidney disease: Secondary | ICD-10-CM | POA: Insufficient documentation

## 2018-01-07 DIAGNOSIS — G2581 Restless legs syndrome: Secondary | ICD-10-CM | POA: Diagnosis not present

## 2018-01-07 DIAGNOSIS — Z85038 Personal history of other malignant neoplasm of large intestine: Secondary | ICD-10-CM | POA: Diagnosis not present

## 2018-01-07 DIAGNOSIS — N184 Chronic kidney disease, stage 4 (severe): Secondary | ICD-10-CM | POA: Diagnosis not present

## 2018-01-07 DIAGNOSIS — I82412 Acute embolism and thrombosis of left femoral vein: Secondary | ICD-10-CM

## 2018-01-07 DIAGNOSIS — I739 Peripheral vascular disease, unspecified: Secondary | ICD-10-CM | POA: Diagnosis not present

## 2018-01-07 DIAGNOSIS — D72829 Elevated white blood cell count, unspecified: Secondary | ICD-10-CM

## 2018-01-07 DIAGNOSIS — N183 Chronic kidney disease, stage 3 (moderate): Secondary | ICD-10-CM

## 2018-01-07 DIAGNOSIS — Z7901 Long term (current) use of anticoagulants: Secondary | ICD-10-CM | POA: Diagnosis not present

## 2018-01-07 LAB — COMPREHENSIVE METABOLIC PANEL
ALT: 11 U/L (ref 0–55)
AST: 12 U/L (ref 5–34)
Albumin: 3 g/dL — ABNORMAL LOW (ref 3.5–5.0)
Alkaline Phosphatase: 112 U/L (ref 40–150)
Anion gap: 9 (ref 3–11)
BUN: 61 mg/dL — ABNORMAL HIGH (ref 7–26)
CO2: 13 mmol/L — ABNORMAL LOW (ref 22–29)
Calcium: 8.6 mg/dL (ref 8.4–10.4)
Chloride: 115 mmol/L — ABNORMAL HIGH (ref 98–109)
Creatinine, Ser: 4.44 mg/dL (ref 0.60–1.10)
GFR calc Af Amer: 11 mL/min — ABNORMAL LOW (ref >60)
GFR calc non Af Amer: 10 mL/min — ABNORMAL LOW (ref >60)
Glucose, Bld: 106 mg/dL (ref 70–140)
Potassium: 4.4 mmol/L (ref 3.3–4.7)
Sodium: 137 mmol/L (ref 136–145)
Total Bilirubin: 0.3 mg/dL (ref 0.2–1.2)
Total Protein: 7.8 g/dL (ref 6.4–8.3)

## 2018-01-07 LAB — IRON AND TIBC
Iron: 40 ug/dL — ABNORMAL LOW (ref 41–142)
Saturation Ratios: 16 % — ABNORMAL LOW (ref 21–57)
TIBC: 254 ug/dL (ref 236–444)
UIBC: 214 ug/dL

## 2018-01-07 LAB — CBC WITH DIFFERENTIAL/PLATELET
Abs Granulocyte: 10.3 10*3/uL — ABNORMAL HIGH (ref 1.5–6.5)
Basophils Absolute: 0 10*3/uL (ref 0.0–0.1)
Basophils Relative: 0 %
Eosinophils Absolute: 0.1 10*3/uL (ref 0.0–0.5)
Eosinophils Relative: 1 %
HCT: 33.5 % — ABNORMAL LOW (ref 34.8–46.6)
Hemoglobin: 10.5 g/dL — ABNORMAL LOW (ref 11.6–15.9)
Lymphocytes Relative: 16 %
Lymphs Abs: 2 10*3/uL (ref 0.9–3.3)
MCH: 26.3 pg (ref 25.1–34.0)
MCHC: 31.3 g/dL — ABNORMAL LOW (ref 31.5–36.0)
MCV: 84 fL (ref 79.5–101.0)
Monocytes Absolute: 0.6 10*3/uL (ref 0.1–0.9)
Monocytes Relative: 5 %
Neutro Abs: 10.3 10*3/uL — ABNORMAL HIGH (ref 1.5–6.5)
Neutrophils Relative %: 78 %
Platelets: 256 10*3/uL (ref 145–400)
RBC: 3.99 MIL/uL (ref 3.70–5.45)
RDW: 16.4 % — ABNORMAL HIGH (ref 11.2–16.1)
WBC: 13.1 10*3/uL — ABNORMAL HIGH (ref 3.9–10.3)

## 2018-01-07 LAB — FERRITIN: Ferritin: 174 ng/mL (ref 9–269)

## 2018-01-07 NOTE — Telephone Encounter (Signed)
Gave patient avs report and appointments for April  °

## 2018-01-07 NOTE — Progress Notes (Signed)
[place]  Telephone:([phone]) [phone] Fax:([phone]) [phone]  Clinic follow up Note   Patient Care Team: Glendale Chard, MD as PCP - General (Internal Medicine) 01/07/2018  CHIEF COMPLAINTS:  Follow up anemia, history of thrombosis   HISTORY OF PRESENTING ILLNESS (08/14/2015):  Whitney Walker 62 y.o. female is referred here by her PCP Dr. Baird Cancer for mild leukocytosis.   She was previously seen by Dr. Jamse Arn in 2013 for leukocytosis. Her WBC has been around 11-18K since 08/2009, with predominantly neutrophils. BCR/ABL and check to mutation were checked in 2013 which were all negative. She also had a mild anemia in the past with hemoglobin in the 11-12 range, her platelet count has been normal.  She had PE in 10/2013 and was treated with Xaralto until 11/2014, she is now on aspirin. She has PVD and had left femoral popliteal vein bypass surgery in 07/2013. She also has COPD, she did use oxygen after she had PE, but not any more since Nov 2015 due to financial reasons. She has mild to moderate dyspenia on exertion.   She had tried quitting smoking a few times and finally stopped about 5 weeks ago.   She has had sinus infection on monthly bases, and recently caught a cold from her grand-grandson. She has headache for the past 4-5 month. She is being referred to see a neurologist.   She had early stage colon cancer in 2009, had surgical resection and no adjuvant chemo was needed. She had last colonoscopy in 05/2012  which was unremarkable. Her last mammogram was 2 years ago and she is planning to have a repeated one soon.   CURRENT THERAPY: Eliquis 2.5mg  bid (dose per renal function)   INTERIM HISTORY Jonay Hitchcock returns for follow up. She states that she is not doing well lately due to her CKD. She is following regularly with her nephrologist and had an appointment with vascular surgeon Dr. Donzetta Matters to discuss fistula implant and dialysis. Pt endorses a decreased appetite due to not  knowing what to eat in order to not have negative effects on her kidneys. She notes that she gets headaches that have been going on for sometime and states that she has an appointment with a neurologist in 02/2018.    On review of systems, pt denies bleeding fever, chills, rash, mouth sores, weight loss, decreased appetite, urinary complaints. Denies pain. Pt denies abdominal pain, nausea, vomiting. Pertinent positives are listed and detailed within the above HPI.   MEDICAL HISTORY:  Past Medical History:  Diagnosis Date  . Anemia   . Anxiety   . Arthritis   . CHF (congestive heart failure) ([place])   . CKD (chronic kidney disease)    Stage III 03/2013  . Colon cancer ([place])   . COPD (chronic obstructive pulmonary disease) ([place])   . Coronary artery disease   . Depression   . Hypertension   . Meningitis   . PE (pulmonary embolism)   . Peripheral vascular disease ([place])   . Shortness of breath   . Sleep apnea   . Vertigo     SURGICAL HISTORY: Past Surgical History:  Procedure Laterality Date  . ABDOMINAL AORTAGRAM N/A 07/26/2013   Procedure: ABDOMINAL Maxcine Ham;  Surgeon: [name] La Fontaine, MD;  Location: [facility] CATH LAB;  Service: Cardiovascular;  Laterality: N/A;  . ABDOMINAL HYSTERECTOMY    . CESAREAN SECTION     X 3 [id number]  . CHOLECYSTECTOMY    . CHOLECYSTECTOMY  1980/s  . COLECTOMY  2010  .  DIVERTICULOSIS SURGERY-2002  2012  . FEMORAL-POPLITEAL BYPASS GRAFT Left 08/13/2013   Procedure: BYPASS GRAFT FEMORAL-POPLITEAL ARTERY WITH NON-REVERSED SAPHANEOUS VEIN; ULTRASOUND GUIDED;  Surgeon: Mal [name], MD;  Location: [place];  Service: Vascular;  Laterality: Left;  . LOWER EXTREMITY ANGIOGRAM Left 07/26/2013   Procedure: LOWER EXTREMITY ANGIOGRAM;  Surgeon: [name] Babson Park, MD;  Location: [facility] CATH LAB;  Service: Cardiovascular;  Laterality: Left;    SOCIAL HISTORY: Social History   Socioeconomic History  . Marital status: Single    Spouse name: Not on file  . Number of children: 4  .  Years of education: 12th gd  . Highest education level: Not on file  Social Needs  . Financial resource strain: Not on file  . Food insecurity - worry: Not on file  . Food insecurity - inability: Not on file  . Transportation needs - medical: Not on file  . Transportation needs - non-medical: Not on file  Occupational History  . Occupation: N/A  Tobacco Use  . Smoking status: Former Smoker    Packs/day: 1.50    Years: 40.00    Pack years: 60.00    Types: Cigarettes    Last attempt to quit: 08/09/2014    Years since quitting: 3.4  . Smokeless tobacco: Current User  Substance and Sexual Activity  . Alcohol use: No    Alcohol/week: 0.0 oz    Comment: she used to drink alcohol, quit in 2010   . Drug use: No  . Sexual activity: Not on file  Other Topics Concern  . Not on file  Social History Narrative   2 cups of coffee a day     FAMILY HISTORY: Family History  Problem Relation Age of Onset  . Cancer Mother 67       breast and bone  . Cancer Father 69       prostate  . Hypertension Sister   . Bleeding Disorder Sister   . Cancer Cousin 20       breast cancer   . Hypertension Daughter     ALLERGIES:  is allergic to gadolinium derivatives and iohexol.  MEDICATIONS:  Current Outpatient Medications  Medication Sig Dispense Refill  . amLODipine (NORVASC) 10 MG tablet Take 10 mg by mouth daily.    [name] apixaban (ELIQUIS) 2.5 MG TABS tablet Take 1 tablet (2.5 mg total) by mouth 2 (two) times daily. 60 tablet 5  . Ascorbic Acid (VITAMIN C) 1000 MG tablet Take 1,000 mg by mouth every other day.    . IRON PO Take by mouth daily.    . NYSTATIN powder APPLY TO AFFECTED AREA BID  2   No current facility-administered medications for this visit.     REVIEW OF SYSTEMS:   Constitutional: Denies fevers or abnormal night sweats (+) difficulty sleeping (+) fatigue (+)chills Eyes: Denies blurriness of vision, double vision or watery eyes Ears, nose, mouth, throat, and face: Denies  mucositis or sore throat Respiratory: Denies cough, dyspnea or wheezes (+) SOB Cardiovascular: Denies palpitation, chest discomfort or lower extremity swelling Gastrointestinal:  Denies nausea, heartburn or change in bowel habits Skin: Denies abnormal skin rashes Lymphatics: Denies new lymphadenopathy or easy bruising Neurological:Denies numbness, tingling or new weaknesses Behavioral/Psych: (+) Depression All other systems were reviewed with the patient and are negative.  PHYSICAL EXAMINATION:  ECOG PERFORMANCE STATUS: 2  Vitals:   01/07/18 1325  BP: (!) 156/72  Pulse: 70  Resp: 20  Temp: 98.4 F (36.9 C)  SpO2: 98%  Filed Weights   01/07/18 1325  Weight: 213 lb 6.4 oz (96.8 kg)    GENERAL:alert, no distress and comfortable SKIN: skin color, texture, turgor are normal, no rashes or significant lesions EYES: normal, conjunctiva are pink and non-injected, sclera clear OROPHARYNX:no exudate, no erythema and lips, buccal mucosa, and tongue normal  NECK: supple, thyroid normal size, non-tender, without nodularity LYMPH:  no palpable lymphadenopathy in the cervical, axillary or inguinal LUNGS: clear to auscultation and percussion (+) labored breathing HEART: regular rate & rhythm and no murmurs. (+) lower extremity edema ABDOMEN:abdomen soft, non-tender and normal bowel sounds Musculoskeletal:no cyanosis of digits and no clubbing  PSYCH: alert & oriented x 3 with fluent speech NEURO: no focal motor/sensory deficits  LABORATORY DATA:  I have reviewed the data as listed CBC Latest Ref Rng & Units 01/07/2018 07/07/2017 03/03/2017  WBC 3.9 - 10.3 K/uL 13.1(H) 13.5(H) 12.9(H)  Hemoglobin 11.6 - 15.9 g/dL 10.5(L) 10.9(L) 10.1(L)  Hematocrit 34.8 - 46.6 % 33.5(L) 35.0 33.3(L)  Platelets 145 - 400 K/uL 256 275 253    CMP Latest Ref Rng & Units 01/07/2018 07/07/2017 03/02/2017  Glucose 70 - 140 mg/dL 106 85 104(H)  BUN 7 - 26 mg/dL 61(H) 40.8(H) 45(H)  Creatinine 0.60 - 1.10 mg/dL 4.44(HH)  3.4(HH) 3.81(H)  Sodium 136 - 145 mmol/L 137 138 138  Potassium 3.3 - 4.7 mmol/L 4.4 4.5 4.5  Chloride 98 - 109 mmol/L 115(H) - 113(H)  CO2 22 - 29 mmol/L 13(L) 17(L) 18(L)  Calcium 8.4 - 10.4 mg/dL 8.6 9.5 8.5(L)  Total Protein 6.4 - 8.3 g/dL 7.8 7.2 6.8  Total Bilirubin 0.2 - 1.2 mg/dL 0.3 <0.22 0.3  Alkaline Phos 40 - 150 U/L 112 112 94  AST 5 - 34 U/L 12 14 20   ALT 0 - 55 U/L 11 16 25    PATHOLOGY REPORT Diagnosis 02/06/2016 Breast, left, needle core biopsy, 11:00 o'clock - FAT NECROSIS WITH ASSOCIATED CALCIFICATION. - NO ATYPIA OR TUMOR SEEN.  RADIOGRAPHIC STUDIES: I have personally reviewed the radiological images as listed and agreed with the findings in the report. No results found.  ASSESSMENT & PLAN:  62 y.o. female with history of DVT, chronic mild leukocytosis and anemia.  1. Recurrent DVT and PE -She previously had episodes of PE, was treated with Xarelto. 10 months after she came off Xarelto, she developed unprovoked DVT and PE. -I recommend her to continue anticoagulation indefinitely, if no contraindications. -Patient was previously on Coumadin, but switched to Eliquis due to her transportation issues with Coumadin check up. -She has stage IV CKD. We previoiusly discussed that coumadin is a better option for patients with severe renal dysfunction. However she has difficulty with her transportation for coumadin checkup, and wishes to continue Eliquis. She takes Eliquis 2.5mg  BID. -Discussed again (01/07/18) that coumadin is the better option for her and we will switch to Coumadin after her AV-fistula surgery and I will monitor her. However, if pt starts dialysis shortly after surgery, she will stay on Eliquis.   3. Mild leukocytosis, with predominant neutrophils -Her total white count TODAY is 13.5K, with neutrophil 10.3K. Her mild leukocytosis has been stable over the past 3 years, previous workup was negative for BCR/ABL or JAK 2 mutation. This is likely reactive,  secondary to her smoking and chronic inflammations (allergy and sinus Infection). No evidence of myeloproliferative neoplasm.  -Follow-up CBC with diff   4. Anemia of chronic disease and anemia of iron deficiency -She had hemoglobin 10-11 lately, overall stable, likely related  to her stage III chronic kidney disease  - Her iron study and SPEP were normal in 2013 -Iron 41 TODAY. %SAT 16. HGB 10.9 I encourage her to continue oral iron supplement, she is concerned about the cost  - I previously called in a prescription iron to see what co pay was  -Follow-up CBC once a year  -Pt's Hgb is 10.5 today (01/07/18) and that is stable.  -Discussed Aranesp injection in the future if her counts continue to trend low or drop further.    5. History of colon cancer in 2009  -She has completed 5 years of surveillance.  -last colonoscopy in 05/2012, next due 2018  6. CKD, stage V -follow up with primary care physician -I recommenced her to try the renal diet to help with a healthy diet safe for her kidneys. -Pt's Cr is 4.44 today (01/07/18) and I discussed the worsening trend.  We do not have safety data about adequate in stage V chronic kidney disease, and I recommend her to switch to Coumadin.  However she has transportation issues and reluctance to start Coumadin.   -She has seen vascular surgeon for AV fistula, likely will start dialysis soon.  If she does start dialysis, it would be okay to use adequate  -.  she will need to make a decision about beginning dialysis and we had a very lengthy discussion about this today. She will contact Dr Claretha Cooper office about fistula surgery soon.   7. OSA, PVD, CHF -She will continue follow-up with her primary care physician and other specialists.  8. Restless leg syndrome -f/u with PCP   Plan Lab and f/u in 3 months Continue Eliquis for now, may consider switch to Coumadin on next visit if she is not going to start dialysis soon.   All questions were answered. The  patient knows to call the clinic with any problems, questions or concerns.  I spent 20 minutes counseling the patient face to face. The total time spent in the appointment was 30 minutes and more than 50% was on counseling.     Truitt Merle, MD 01/07/2018   This document serves as a record of services personally performed by Truitt Merle, MD. It was created on her behalf by Theresia Bough, a trained medical scribe. The creation of this record is based on the scribe's personal observations and the provider's statements to them.   I have reviewed the above documentation for accuracy and completeness, and I agree with the above.

## 2018-01-08 ENCOUNTER — Encounter: Payer: Self-pay | Admitting: Hematology

## 2018-01-14 DIAGNOSIS — E785 Hyperlipidemia, unspecified: Secondary | ICD-10-CM | POA: Diagnosis not present

## 2018-01-14 DIAGNOSIS — D631 Anemia in chronic kidney disease: Secondary | ICD-10-CM | POA: Diagnosis not present

## 2018-01-14 DIAGNOSIS — Z720 Tobacco use: Secondary | ICD-10-CM | POA: Diagnosis not present

## 2018-01-14 DIAGNOSIS — N183 Chronic kidney disease, stage 3 (moderate): Secondary | ICD-10-CM | POA: Diagnosis not present

## 2018-01-14 DIAGNOSIS — E669 Obesity, unspecified: Secondary | ICD-10-CM | POA: Diagnosis not present

## 2018-03-03 ENCOUNTER — Ambulatory Visit (INDEPENDENT_AMBULATORY_CARE_PROVIDER_SITE_OTHER): Payer: Medicare Other | Admitting: Psychology

## 2018-03-03 ENCOUNTER — Encounter: Payer: Self-pay | Admitting: Psychology

## 2018-03-03 DIAGNOSIS — F333 Major depressive disorder, recurrent, severe with psychotic symptoms: Secondary | ICD-10-CM

## 2018-03-03 DIAGNOSIS — R413 Other amnesia: Secondary | ICD-10-CM | POA: Diagnosis not present

## 2018-03-03 NOTE — Progress Notes (Signed)
NEUROBEHAVIORAL STATUS EXAM   Name: Whitney Walker Date of Birth: March 16, 1956 Date of Interview: 03/03/2018  Reason for Referral:  Whitney Walker is a 62 y.o. female who is referred for neuropsychological evaluation by Dr. Star Age of [facility] due to concerns about memory loss. This patient is unaccompanied in the office for today's visit.  History of Presenting Problem:  Whitney Walker has been followed by Dr. Rexene Alberts since 04/2015 for sleep apnea, headaches and subjective memory complaints. She has a complex medication history including hypertension, obesity, congestive heart failure, chronic kidney disease (stage V, currently deciding whether to start dialysis), COPD, depression, peripheral vascular disease, DVT and PE in 2014, and recurrent DVT and PE in October 2016, now on Eliquis, prior tobacco use, OSA (severe, currently no longer on BiPAP with prior suboptimal compliance, prior CPAP non-compliance and intolerance reported), and colon cancer (2009, had surgical resection and no chemotherapy was needed). She had a brain MRI without contrast in 04/2015 that showed white matter changes most compatible with small vessel ischemic changes, more than expected for age (no acute findings). When she first saw Dr. Rexene Alberts in 04/2015 she reported memory difficulties over the prior 6 mos. MMSE was 27/30 at that time. MMSE scores were relatively stable over the past 3 years. She was most recently seen by Dr. Rexene Alberts on 09/24/2017; MMSE was 28/30. She was experiencing increased depression but was not taking any medication for this and had not seen a psychiatrist or followed up with her PCP about depression treatment.   At today's appointment (03/03/2018), the patient is quite depressed and tearful. She reports she needs to start dialysis but "I don't want to do it". She states, "Nothing else seems to matter now." The patient reports a long history of depression, since the 1980s when she was caring  for both her mother and grandmother at the same time. Her mother died from cancer when she was in her 80s. She was prescribed medication for depression in the pat but states she never stayed on it because she "didn't want to be like a zombie". She never had counseling or therapy for depression. She reports increased depression lately with her worsening medical conditions including kidney failure. She reports that she had passive suicidal ideation in the past but ultimately decided she would not do that. She has not had suicidal ideation or intention lately. She is Catholic and this is a protective factor. She is quite indecisive about whether to get dialysis or not. She has put off the decision for a few months now. She understands the alternative is palliative care. She is also indecisive about treatment for depression. She states she does not want to take any medications because of her kidney problems, but then states if she goes on dialysis "it won't even matter". When I proposed the possibility of counseling/therapy, she stated "it seems easier to be on a pill". Ultimately, she agreed that counseling may be helpful for her, but she noted that she does not have regular transportation which would be a barrier to treatment.   Of note, the patient reports recent onset (in the past 6 months) of visual and auditory hallucinations. She will see something moving out of the corner of her eye but when she looks closer nothing is there. She will think she sees something run across the floor. It is not a person or specific thing, she just sees movement. She also was hearing her phone ring or the doorbell ring but this  hasn't happened as much lately. She does hear loud knocking on the door and window even though no one is there. This tends to happen as she is going to sleep or sometimes it wakes her up from her sleep. It doesn't scare her or bother her anymore since it has been happening so regularly. She notes that she  does believe in spirits and she believes that if there are spirits there they are not there to hurt her.   The patient reports ongoing sleep disturbance with insomnia. As noted previously, she has a history of severe OSA but had poor compliance with CPAP and BiPAP. She was evaluated for surgical options and the only possibility recommended was trach placement which understandably she did not want to have. OSA remains untreated.  With regard to cognitive complaints, the patient reports that she has forgetfulness for "small things". If someone tells her something, she hears it, but it may not stick with her. For example, she did not recall my name after I had introduced myself earlier. She lives alone and is able to manage all instrumental ADLs independently. She does not drive as she does not have a car. She manages her finances/bills, medications, appointments and meal preparation without any reported difficulty. She has been on disability since around 2006 when she had colon cancer.  She denies any history of alcohol or drug abuse or dependence.   Social History: Education: high school graduate Occupational history: On disability since about 2006/2007. Used to do private duty nursing. Marital history: Divorced x18 years. She has 3 children living, 6 grandchildren (raised 3 of them), 2 great grandchildren (ages 57 and 9), she was babysitting one of them but is no longer able to do this due to her physical limitations/medical conditions. Alcohol: Occasional, minimal Tobacco: She is a former daily smoker and had quit smoking, but admits she has an occasional cigarette now (eg when someone else has one around her). She continues to have cravings for cigarettes. SA: Denied   Medical History: Past Medical History:  Diagnosis Date  . Anemia   . Anxiety   . Arthritis   . CHF (congestive heart failure) ([place])   . CKD (chronic kidney disease)    Stage III 03/2013  . Colon cancer ([place])   . COPD  (chronic obstructive pulmonary disease) ([place])   . Coronary artery disease   . Depression   . Hypertension   . Meningitis   . PE (pulmonary embolism)   . Peripheral vascular disease ([place])   . Shortness of breath   . Sleep apnea   . Vertigo      Current Medications:  Outpatient Encounter Medications as of 03/03/2018  Medication Sig  . amLODipine (NORVASC) 10 MG tablet Take 10 mg by mouth daily.  [name] apixaban (ELIQUIS) 2.5 MG TABS tablet Take 1 tablet (2.5 mg total) by mouth 2 (two) times daily.  . Ascorbic Acid (VITAMIN C) 1000 MG tablet Take 1,000 mg by mouth every other day.  . IRON PO Take by mouth daily.  . NYSTATIN powder APPLY TO AFFECTED AREA BID   No facility-administered encounter medications on file as of 03/03/2018.    Also Lebatalol (for blood pressure) 200 mg daily  Behavioral Observations:   Appearance: Casually dressed, mildly disheveled Gait: Ambulated independently, no gross abnormalities observed Speech: Fluent; normal rate, rhythm and volume. No significant word finding difficulty. Thought process: Linear, goal directed Affect: Full, dysthymic, tearful Interpersonal: Pleasant, appropriate She is able to provide coherent, organized history  and does not demonstrate any obvious cognitive deficits through the course of her clinical interview.   50 minutes spent face-to-face with patient completing neurobehavioral status exam. 30 minutes spent integrating medical records/clinical data and completing this report. CPT code [id number].  CLINICAL IMPRESSIONS (based on interview/neurobehavioral status exam): Major depressive disorder, severe, with psychotic features. The patient has a long history of depression recently exacerbated by declining physical health and associated psychosocial stress. Prominent symptoms include depressed mood, anhedonia, decreased interest, hopelessness, indecisiveness, some passive suicidal ideation (no intention currently), and A/V hallucinations  which she thinks may represent spirits visiting her (likely related to increased thinking of her mortality). She has a complex medical history with great difficulty making decisions about medical care/interventions. She does not present with any overt cognitive impairment in the clinical interview, and she is managing all complex ADLs independently. I explained that her depression is a major concern of mine at this point, and that I think it is important for her to get treatment of her depression ASAP. I explained that regardless of whether she proceeds with medical interventions (eg dialysis), ongoing depression at this severe level will significantly reduce quality of life. I explained that the most effective way to treat depression, per literature in the field, is a combination of medication and counseling, but that she would need to discuss with her kidney doctor(s) if antidepressant medication is an option for her. I explained that counseling would likely be very helpful for her right now especially as she is in the middle of making some difficult decisions about medical care. I also explained that while I am happy to have her complete the cognitive testing as scheduled, my recommendation at the end of testing would likely be the same (ie recommend treatment for depression). I also explained that untreated sleep apnea and other medical conditions (vascular risk factors) could be contributing to cognitive symptoms. Ultimately she decided not to pursue testing at this time. Although she continued to demonstrate ambivalence about treatment for depression, she ultimately accepted a referral to [facility] for individual counseling. She will also speak to her kidney doctor about whether antidepressant medication is an option for her if she goes on dialysis.    PLAN: Patient will not complete neuropsychological evaluation at this time. We will cancel her testing appointment and follow-up appointment  with me. She will be referred to [facility] for individual counseling for depression as discussed above. She will speak to her physician(s) about the possibility of antidepressant medication in light of her kidney disease and possible dialysis. At this time, no follow-up with our office is scheduled. However, she understands that if she changes her mind in the future about neurocognitive testing and wants to go through with the full evaluation we will happily schedule her for that.

## 2018-03-09 DIAGNOSIS — N184 Chronic kidney disease, stage 4 (severe): Secondary | ICD-10-CM | POA: Diagnosis not present

## 2018-03-09 DIAGNOSIS — C189 Malignant neoplasm of colon, unspecified: Secondary | ICD-10-CM | POA: Diagnosis not present

## 2018-03-09 DIAGNOSIS — I7025 Atherosclerosis of native arteries of other extremities with ulceration: Secondary | ICD-10-CM | POA: Diagnosis not present

## 2018-03-09 DIAGNOSIS — E669 Obesity, unspecified: Secondary | ICD-10-CM | POA: Diagnosis not present

## 2018-03-09 DIAGNOSIS — I2699 Other pulmonary embolism without acute cor pulmonale: Secondary | ICD-10-CM | POA: Diagnosis not present

## 2018-03-09 DIAGNOSIS — I5032 Chronic diastolic (congestive) heart failure: Secondary | ICD-10-CM | POA: Diagnosis not present

## 2018-03-09 DIAGNOSIS — N2581 Secondary hyperparathyroidism of renal origin: Secondary | ICD-10-CM | POA: Diagnosis not present

## 2018-03-09 DIAGNOSIS — I129 Hypertensive chronic kidney disease with stage 1 through stage 4 chronic kidney disease, or unspecified chronic kidney disease: Secondary | ICD-10-CM | POA: Diagnosis not present

## 2018-03-09 DIAGNOSIS — E785 Hyperlipidemia, unspecified: Secondary | ICD-10-CM | POA: Diagnosis not present

## 2018-03-09 DIAGNOSIS — D631 Anemia in chronic kidney disease: Secondary | ICD-10-CM | POA: Diagnosis not present

## 2018-03-09 DIAGNOSIS — Z8719 Personal history of other diseases of the digestive system: Secondary | ICD-10-CM | POA: Diagnosis not present

## 2018-03-09 DIAGNOSIS — Z720 Tobacco use: Secondary | ICD-10-CM | POA: Diagnosis not present

## 2018-03-17 DIAGNOSIS — N2581 Secondary hyperparathyroidism of renal origin: Secondary | ICD-10-CM | POA: Diagnosis not present

## 2018-03-23 ENCOUNTER — Encounter: Payer: Self-pay | Admitting: Psychology

## 2018-03-25 ENCOUNTER — Ambulatory Visit: Payer: Medicare Other | Admitting: Adult Health

## 2018-04-07 ENCOUNTER — Ambulatory Visit (INDEPENDENT_AMBULATORY_CARE_PROVIDER_SITE_OTHER): Payer: Medicare Other | Admitting: Psychiatry

## 2018-04-07 DIAGNOSIS — F333 Major depressive disorder, recurrent, severe with psychotic symptoms: Secondary | ICD-10-CM | POA: Diagnosis not present

## 2018-04-10 ENCOUNTER — Ambulatory Visit (INDEPENDENT_AMBULATORY_CARE_PROVIDER_SITE_OTHER): Payer: Medicare Other | Admitting: Vascular Surgery

## 2018-04-10 ENCOUNTER — Encounter: Payer: Self-pay | Admitting: Vascular Surgery

## 2018-04-10 ENCOUNTER — Ambulatory Visit: Payer: Self-pay | Admitting: Hematology

## 2018-04-10 ENCOUNTER — Other Ambulatory Visit: Payer: Self-pay

## 2018-04-10 ENCOUNTER — Encounter: Payer: Self-pay | Admitting: *Deleted

## 2018-04-10 VITALS — BP 108/59 | HR 68 | Temp 97.3°F | Resp 22 | Ht 63.0 in | Wt 199.0 lb

## 2018-04-10 DIAGNOSIS — N184 Chronic kidney disease, stage 4 (severe): Secondary | ICD-10-CM

## 2018-04-10 NOTE — Progress Notes (Signed)
Patient ID: Whitney Walker, female   DOB: 1956/02/10, 62 y.o.   MRN: [id number]  Reason for Consult: Chronic Kidney Disease (eval for AVF)   Referred by Edrick Oh, MD  Subjective:     HPI:  Whitney Walker is a 62 y.o. female with chronic kidney disease.  She was recently seen and evaluated for dialysis access but she refused at that time.  She is now seeing a psychiatrist because she has such anxiety over the thought of dialysis.  She is not on the transplant list that she knows of.  She does take Eliquis for history of DVT.  She also has a history of a left femoral to popliteal artery bypass with vein for acute occlusion.  She has multiple questions regarding the need for dialysis moving forward.  Past Medical History:  Diagnosis Date  . Anemia   . Anxiety   . Arthritis   . CHF (congestive heart failure) ([place])   . CKD (chronic kidney disease)    Stage III 03/2013  . Colon cancer ([place])   . COPD (chronic obstructive pulmonary disease) ([place])   . Coronary artery disease   . Depression   . Hypertension   . Meningitis   . PE (pulmonary embolism)   . Peripheral vascular disease ([place])   . Shortness of breath   . Sleep apnea   . Vertigo    Family History  Problem Relation Age of Onset  . Cancer Mother 33       breast and bone  . Cancer Father 62       prostate  . Hypertension Sister   . Bleeding Disorder Sister   . Cancer Cousin 20       breast cancer   . Hypertension Daughter    Past Surgical History:  Procedure Laterality Date  . ABDOMINAL AORTAGRAM N/A 07/26/2013   Procedure: ABDOMINAL Maxcine Ham;  Surgeon: [name] Bellevue, MD;  Location: [facility] CATH LAB;  Service: Cardiovascular;  Laterality: N/A;  . ABDOMINAL HYSTERECTOMY    . CESAREAN SECTION     X 3 [id number]  . CHOLECYSTECTOMY    . CHOLECYSTECTOMY  1980/s  . COLECTOMY  2010  . DIVERTICULOSIS SURGERY-2002  2012  . FEMORAL-POPLITEAL BYPASS GRAFT Left 08/13/2013   Procedure: BYPASS GRAFT FEMORAL-POPLITEAL ARTERY WITH  NON-REVERSED SAPHANEOUS VEIN; ULTRASOUND GUIDED;  Surgeon: Mal [name], MD;  Location: [facility];  Service: Vascular;  Laterality: Left;  . LOWER EXTREMITY ANGIOGRAM Left 07/26/2013   Procedure: LOWER EXTREMITY ANGIOGRAM;  Surgeon: [name] [place], MD;  Location[facility] CATH LAB;  Service: Cardiovascular;  Laterality: Left;    Short Social History:  Social History   Tobacco Use  . Smoking status: Former Smoker    Packs/day: 1.50    Years: 40.00    Pack years: 60.00    Types: Cigarettes    Last attempt to quit: 08/09/2014    Years since quitting: 3.6  . Smokeless tobacco: Current User  Substance Use Topics  . Alcohol use: No    Alcohol/week: 0.0 oz    Comment: she used to drink alcohol, quit in 2010     Allergies  Allergen Reactions  . Gadolinium Derivatives Hives and Other (See Comments)    HIVES, Desc: HIVES W/ "DYE" USED FOR 1ST CT SCAN BUT NOT 2ND, NO PREMEDS USED, PT UNCERTAIN OF CIRCUMSTANCES,,?POSSIBLE MRI CONTRAST ALLERGY, ALL STUDIES DONE "SOMEWHERE" IN [place]//A.C., Onset Date: [id number]  . Iohexol Other (See Comments)     Code: HIVES, Desc: HIVES W/ "  DYE" USED FOR 1ST CT SCAN BUT NOT 2ND, NO PREMEDS USED, PT UNCERTAIN OF CIRCUMSTANCES,,?POSSIBLE MRI CONTRAST ALLERGY, ALL STUDIES DONE "SOMEWHERE" IN [place]//A.C., Onset Date: [id number]     Current Outpatient Medications  Medication Sig Dispense Refill  . amLODipine (NORVASC) 10 MG tablet Take 10 mg by mouth daily.    [name] apixaban (ELIQUIS) 2.5 MG TABS tablet Take 1 tablet (2.5 mg total) by mouth 2 (two) times daily. 60 tablet 5  . Ascorbic Acid (VITAMIN C) 1000 MG tablet Take 1,000 mg by mouth every other day.    . IRON PO Take by mouth daily.    [name] albuterol (PROVENTIL HFA;VENTOLIN HFA) 108 (90 Base) MCG/ACT inhaler Inhale into the lungs.    . NYSTATIN powder APPLY TO AFFECTED AREA BID  2   No current facility-administered medications for this visit.     Review of Systems  Constitutional:  Constitutional  negative. HENT: HENT negative.  Eyes: Eyes negative.  Respiratory: Positive for shortness of breath.  GI: Gastrointestinal negative.  Skin: Positive for rash.  Neurological:       Restless left leg Psychiatric:       anxiety       Objective:  Objective   Vitals:   04/10/18 0834  BP: (!) 108/59  Pulse: 68  Resp: (!) 22  Temp: (!) 97.3 F (36.3 C)  TempSrc: Oral  SpO2: 99%  Weight: 199 lb (90.3 kg)  Height: 5\' 3"  (1.6 m)   Body mass index is 35.25 kg/m.  Physical Exam  Constitutional: She is oriented to person, place, and time. She appears well-developed.  HENT:  Head: Normocephalic.  Eyes: Pupils are equal, round, and reactive to light.  Neck: Normal range of motion.  Cardiovascular: Normal rate.  Pulses:      Radial pulses are 2+ on the right side, and 2+ on the left side.       Posterior tibial pulses are 2+ on the left side.  Pulmonary/Chest: Effort normal.  Abdominal: Soft.  Musculoskeletal: She exhibits no edema.  Neurological: She is oriented to person, place, and time.  Skin: Skin is warm and dry.  Psychiatric: She has a normal mood and affect. Her behavior is normal. Judgment and thought content normal.    Data: No new studies today but previously had marginal vein for creation of left cephalic or basilic vein fistula.     Assessment/Plan:     62 year old female with chronic kidney disease previously seen for evaluation of dialysis access creation.  We again discussed the possibilities for hemodialysis being catheter, graft, fistula and that preferably we would have a fistula prior to her initiating dialysis.  She has multiple questions regarding her dialysis and the need for it and what other options she has and she is actually seeing a psychiatrist over severe anxiety regarding dialysis.  At this time she wants to proceed with creation of a left upper arm AV fistula which I told her may take 2 procedures given the depth of both the cephalic and basilic  veins.  Also these are marginal veins and there may be primary nonfunction requiring graft creation.  She demonstrates good understanding she will see Dr. [name] prior to scheduling this procedure and she will bring her questions the day of surgery for me.     Waynetta Sandy MD [facility]

## 2018-04-13 ENCOUNTER — Other Ambulatory Visit: Payer: Self-pay | Admitting: *Deleted

## 2018-04-14 DIAGNOSIS — E785 Hyperlipidemia, unspecified: Secondary | ICD-10-CM | POA: Diagnosis not present

## 2018-04-14 DIAGNOSIS — I129 Hypertensive chronic kidney disease with stage 1 through stage 4 chronic kidney disease, or unspecified chronic kidney disease: Secondary | ICD-10-CM | POA: Diagnosis not present

## 2018-04-14 DIAGNOSIS — N183 Chronic kidney disease, stage 3 (moderate): Secondary | ICD-10-CM | POA: Diagnosis not present

## 2018-04-14 DIAGNOSIS — D631 Anemia in chronic kidney disease: Secondary | ICD-10-CM | POA: Diagnosis not present

## 2018-04-15 ENCOUNTER — Other Ambulatory Visit: Payer: Self-pay | Admitting: *Deleted

## 2018-04-16 NOTE — Progress Notes (Signed)
[facility]  [phone] [phone]  Clinic follow up Note   Patient Care Team: Glendale Chard, MD as PCP - General (Internal Medicine) Waynetta Sandy, MD as Consulting Physician (Vascular Surgery) Edrick Oh, MD as Consulting Physician (Nephrology) 04/17/2018  CHIEF COMPLAINTS:  Follow up anemia, history of thrombosis   HISTORY OF PRESENTING ILLNESS (08/14/2015):  Whitney Walker 62 y.o. female is referred here by her PCP Dr. Baird Cancer for mild leukocytosis.   She was previously seen by Dr. Jamse Arn in 2013 for leukocytosis. Her WBC has been around 11-18K since 08/2009, with predominantly neutrophils. BCR/ABL and check to mutation were checked in 2013 which were all negative. She also had a mild anemia in the past with hemoglobin in the 11-12 range, her platelet count has been normal.  She had PE in 10/2013 and was treated with Xaralto until 11/2014, she is now on aspirin. She has PVD and had left femoral popliteal vein bypass surgery in 07/2013. She also has COPD, she did use oxygen after she had PE, but not any more since Nov 2015 due to financial reasons. She has mild to moderate dyspenia on exertion.   She had tried quitting smoking a few times and finally stopped about 5 weeks ago.   She has had sinus infection on monthly bases, and recently caught a cold from her grand-grandson. She has headache for the past 4-5 month. She is being referred to see a neurologist.   She had early stage colon cancer in 2009, had surgical resection and no adjuvant chemo was needed. She had last colonoscopy in 05/2012  which was unremarkable. Her last mammogram was 2 years ago and she is planning to have a repeated one soon.   CURRENT THERAPY: Eliquis 2.5mg  bid (dose per renal function)   INTERIM HISTORY Whitney Walker returns for follow up. She presents to the clinic by herself. She reports she is not doing that well. She reports she will have fistula  placement on her left upper arm on 05/05/18 with Dr. Donzetta Matters and she will start dialysis at the same time. Pt has anxiety about beginning dialysis and is concerned about her quality of life. She has started to see a psychiatrist. Pt notes to also have a rash over chest and breasts that is itching onset 1 week ago.   On review of systems, pt denies any other complaints at this time. Pertinent positives are listed and detailed within the above HPI.   MEDICAL HISTORY:  Past Medical History:  Diagnosis Date  . Anemia   . Anxiety   . Arthritis   . CHF (congestive heart failure) ([place])   . CKD (chronic kidney disease)    Stage III 03/2013  . Colon cancer ([place])   . COPD (chronic obstructive pulmonary disease) ([place])   . Coronary artery disease   . Depression   . Hypertension   . Meningitis   . PE (pulmonary embolism)   . Peripheral vascular disease ([place])   . Shortness of breath   . Sleep apnea   . Vertigo     SURGICAL HISTORY: Past Surgical History:  Procedure Laterality Date  . ABDOMINAL AORTAGRAM N/A 07/26/2013   Procedure: ABDOMINAL Maxcine Ham;  Surgeon: [name] Castle Pines, MD;  Location: [facility] CATH LAB;  Service: Cardiovascular;  Laterality: N/A;  . ABDOMINAL HYSTERECTOMY    . CESAREAN SECTION     X 3 [id number]  . CHOLECYSTECTOMY    . CHOLECYSTECTOMY  1980/s  . COLECTOMY  2010  .  DIVERTICULOSIS SURGERY-2002  2012  . FEMORAL-POPLITEAL BYPASS GRAFT Left 08/13/2013   Procedure: BYPASS GRAFT FEMORAL-POPLITEAL ARTERY WITH NON-REVERSED SAPHANEOUS VEIN; ULTRASOUND GUIDED;  Surgeon: Mal [name], MD;  Location: [facility];  Service: Vascular;  Laterality: Left;  . LOWER EXTREMITY ANGIOGRAM Left 07/26/2013   Procedure: LOWER EXTREMITY ANGIOGRAM;  Surgeon: [name] , MD;  Location: [facility] CATH LAB;  Service: Cardiovascular;  Laterality: Left;    SOCIAL HISTORY: Social History   Socioeconomic History  . Marital status: Single    Spouse name: Not on file  . Number of children: 4  . Years of education:  12th gd  . Highest education level: Not on file  Occupational History  . Occupation: N/A  Social Needs  . Financial resource strain: Not on file  . Food insecurity:    Worry: Not on file    Inability: Not on file  . Transportation needs:    Medical: Not on file    Non-medical: Not on file  Tobacco Use  . Smoking status: Former Smoker    Packs/day: 1.50    Years: 40.00    Pack years: 60.00    Types: Cigarettes    Last attempt to quit: 08/09/2014    Years since quitting: 3.6  . Smokeless tobacco: Current User  Substance and Sexual Activity  . Alcohol use: No    Alcohol/week: 0.0 oz    Comment: she used to drink alcohol, quit in 2010   . Drug use: No  . Sexual activity: Not on file  Lifestyle  . Physical activity:    Days per week: Not on file    Minutes per session: Not on file  . Stress: Not on file  Relationships  . Social connections:    Talks on phone: Not on file    Gets together: Not on file    Attends religious service: Not on file    Active member of club or organization: Not on file    Attends meetings of clubs or organizations: Not on file    Relationship status: Not on file  . Intimate partner violence:    Fear of current or ex partner: Not on file    Emotionally abused: Not on file    Physically abused: Not on file    Forced sexual activity: Not on file  Other Topics Concern  . Not on file  Social History Narrative   2 cups of coffee a day     FAMILY HISTORY: Family History  Problem Relation Age of Onset  . Cancer Mother 85       breast and bone  . Cancer Father 77       prostate  . Hypertension Sister   . Bleeding Disorder Sister   . Cancer Cousin 20       breast cancer   . Hypertension Daughter     ALLERGIES:  is allergic to gadolinium derivatives and iohexol.  MEDICATIONS:  Current Outpatient Medications  Medication Sig Dispense Refill  . albuterol (PROVENTIL HFA;VENTOLIN HFA) 108 (90 Base) MCG/ACT inhaler Inhale into the lungs.    [name]  amLODipine (NORVASC) 10 MG tablet Take 10 mg by mouth daily.    [name] apixaban (ELIQUIS) 2.5 MG TABS tablet Take 1 tablet (2.5 mg total) by mouth 2 (two) times daily. 60 tablet 5  . Ascorbic Acid (VITAMIN C) 1000 MG tablet Take 1,000 mg by mouth every other day.    . IRON PO Take by mouth daily.    Eddie Candle  powder APPLY TO AFFECTED AREA BID  2  . methylPREDNISolone (MEDROL DOSEPAK) 4 MG TBPK tablet Take as directed  0   No current facility-administered medications for this visit.     REVIEW OF SYSTEMS:  Constitutional: Denies fevers or abnormal night sweats (+) difficulty sleeping (+) fatigue  Eyes: Denies blurriness of vision, double vision or watery eyes Ears, nose, mouth, throat, and face: Denies mucositis or sore throat Respiratory: Denies cough, dyspnea or wheezes (+) SOB Cardiovascular: Denies palpitation, chest discomfort or lower extremity swelling Gastrointestinal:  Denies nausea, heartburn or change in bowel habits Skin: (+) rash over her chest and breasts  Lymphatics: Denies new lymphadenopathy or easy bruising Neurological:Denies numbness, tingling or new weaknesses Behavioral/Psych: (+) Depression and Anxiety  All other systems were reviewed with the patient and are negative.  PHYSICAL EXAMINATION:  ECOG PERFORMANCE STATUS: 2  Vitals:   04/17/18 1404  BP: (!) 142/56  Pulse: 80  Resp: 18  Temp: 97.7 F (36.5 C)  SpO2: 100%   Filed Weights   04/17/18 1404  Weight: 201 lb 6.4 oz (91.4 kg)    GENERAL:alert, no distress and comfortable SKIN: skin color, texture, turgor are normal (+) diffuse papular rash across the front chest. No skin erythema, ulcers, or discharge EYES: normal, conjunctiva are pink and non-injected, sclera clear OROPHARYNX:no exudate, no erythema and lips, buccal mucosa, and tongue normal  NECK: supple, thyroid normal size, non-tender, without nodularity LYMPH:  no palpable lymphadenopathy in the cervical, axillary or inguinal LUNGS: clear to  auscultation and percussion (+) labored breathing HEART: regular rate & rhythm and no murmurs. ABDOMEN:abdomen soft, non-tender and normal bowel sounds Musculoskeletal:no cyanosis of digits and no clubbing  PSYCH: alert & oriented x 3 with fluent speech NEURO: no focal motor/sensory deficits  LABORATORY DATA:  I have reviewed the data as listed CBC Latest Ref Rng & Units 04/17/2018 01/07/2018 07/07/2017  WBC 3.9 - 10.3 K/uL 13.6(H) 13.1(H) 13.5(H)  Hemoglobin 11.6 - 15.9 g/dL 8.8(L) 10.5(L) 10.9(L)  Hematocrit 34.8 - 46.6 % 27.2(L) 33.5(L) 35.0  Platelets 145 - 400 K/uL 267 256 275    CMP Latest Ref Rng & Units 04/17/2018 01/07/2018 07/07/2017  Glucose 70 - 140 mg/dL 143(H) 106 85  BUN 7 - 26 mg/dL 105(H) 61(H) 40.8(H)  Creatinine 0.60 - 1.10 mg/dL 9.27(HH) 4.44(HH) 3.4(HH)  Sodium 136 - 145 mmol/L 135(L) 137 138  Potassium 3.5 - 5.1 mmol/L 5.7(H) 4.4 4.5  Chloride 98 - 109 mmol/L 114(H) 115(H) -  CO2 22 - 29 mmol/L 9(L) 13(L) 17(L)  Calcium 8.4 - 10.4 mg/dL 8.6 8.6 9.5  Total Protein 6.4 - 8.3 g/dL 7.7 7.8 7.2  Total Bilirubin 0.2 - 1.2 mg/dL 0.3 0.3 <0.22  Alkaline Phos 40 - 150 U/L 96 112 112  AST 5 - 34 U/L 10 12 14   ALT 0 - 55 U/L 9 11 16    PATHOLOGY REPORT Diagnosis 02/06/2016 Breast, left, needle core biopsy, 11:00 o'clock - FAT NECROSIS WITH ASSOCIATED CALCIFICATION. - NO ATYPIA OR TUMOR SEEN.  RADIOGRAPHIC STUDIES: I have personally reviewed the radiological images as listed and agreed with the findings in the report. No results found.  ASSESSMENT & PLAN:  62 y.o. female with history of DVT, chronic mild leukocytosis and anemia.  1. Recurrent DVT and PE -She previously had episodes of PE, was treated with Xarelto. 10 months after she came off Xarelto, she developed unprovoked DVT and PE. -I recommend her to continue anticoagulation indefinitely, if no contraindications. -Patient was previously on  Coumadin, but switched to Eliquis due to her transportation issues with  Coumadin check up. -When she had stage IV CKD. We previoiusly discussed that coumadin is a better option for patients with severe renal dysfunction. However she has difficulty with her transportation for coumadin checkup, and wishes to continue Eliquis. She takes Eliquis 2.5mg  BID. -We discussed again on 01/07/18 visit that coumadin is the better option for her and we will switch to Coumadin after her AV-fistula surgery and I will monitor her. However, if pt starts dialysis shortly after surgery, she will stay on Eliquis.  -She is having surgery for fistula placement on 05/05/18. Due to her ESRD, I discussed her switching to Coumadin again. She again declined due to transportation issue. However, she has a rash over her chest and she believes it could be caused by her Eliquis and her Cr is 9.7 today.  Since she has not started dialysis yet, it is not safe to continue Eliquis with her kidney function, so I recommend her to stop eliquis from now.  -She can restart Eliquis after 5/7 when she starts dialysis.   2. Mild leukocytosis, with predominant neutrophils -Her total white count previously was 13.5K, with neutrophil 10.3K. Her mild leukocytosis has been stable over the past 3 years, previous workup was negative for BCR/ABL or JAK 2 mutation. This is likely reactive, secondary to her smoking and chronic inflammations (allergy and sinus Infection). No evidence of myeloproliferative neoplasm.  -Follow-up CBC with diff   3. Anemia of chronic disease and anemia of iron deficiency -She had hemoglobin 10-11 lately, overall stable, likely related to her stage III chronic kidney disease  - Her iron study and SPEP were normal in 2013 -Iron 41 %SAT 16. HGB 10.9 I previously encouraged her to continue oral iron supplement, she is concerned about the cost  --she was on oral iron but stopped a few months ago -her Hgb is 8.8 today (04/17/18) and I suspect this is due to her worsening kidney function. I discussed that  this may become slightly worse when she starts dialysis. Dr. [name] can offer her Aranesp injection at that time. She is hesitant to have an injection. She agrees to start taking oral iron pill  4. History of colon cancer in 2009  -She has completed 5 years of surveillance.  -last colonoscopy in 05/2012, next due 2018  5. CKD, stage V -follow up with primary care physician -I recommenced her to try the renal diet to help with a healthy diet safe for her kidneys. -Pt's Cr was 4.44 on 01/07/18 visit and I discussed the worsening trend.  We do not have safe data about Eliquis in stage V chronic kidney disease, and I recommend her to switch to Coumadin.  However she has transportation issues and reluctance to start Coumadin.   -She has seen vascular surgeon for AV fistula, likely will start dialysis soon.  If she does start dialysis, it would be okay to use Eliquis -She is scheduled for fistula surgery with Dr. Donzetta Matters on 05/05/18 and she will also start dialysis at that time. Her Cr is 9.7 today and I advised her to hold Eliquis now and she can restart it after she starts Dialysis.  -her Hgb is 8.8 today and I discussed that this may become slightly worse when she starts dialysis. Dr. [name] can offer her Aranesp injection at that time.   6. OSA, PVD, CHF -She will continue follow-up with her primary care physician and other specialists.  7. Restless  leg syndrome -f/u with PCP   8. Rash over her chest -She has developed a rash over her chest and breasts that is itching. She believes it could be related to Eliquis or changing detergent.  -I advised her to hold Eliquis anyway due to worsening kidney function and upcoming surgery.  -I recommend her to use hydrocortisone cream over the area and benadryl as needed.   9. Anxiety and depression  -Pt has increasing anxiety about having to start dialysis. She has started to see a Psychiatrist lately  -I offered social work services today, pt declined    Plan -Hold Eliquis due to worsening kidney function and upcoming surgery -Use  hydrocortisone cream and benadryl as needed for skin rash. -Start oral iron supplement  -AV fistula and begin dialysis on 05/05/18, restart Eliquis after she starts HD, at 2.5mg  bid  -Social Worker consult offered today, she declined   All questions were answered. The patient knows to call the clinic with any problems, questions or concerns.  I spent 30 minutes counseling the patient face to face. The total time spent in the appointment was 35 minutes and more than 50% was on counseling.   This document serves as a record of services personally performed by Truitt Merle, MD. It was created on her behalf by Theresia Bough, a trained medical scribe. The creation of this record is based on the scribe's personal observations and the provider's statements to them.   I have reviewed the above documentation for accuracy and completeness, and I agree with the above.    Truitt Merle, MD 04/17/2018 4:43 PM

## 2018-04-17 ENCOUNTER — Encounter: Payer: Self-pay | Admitting: Hematology

## 2018-04-17 ENCOUNTER — Inpatient Hospital Stay (HOSPITAL_BASED_OUTPATIENT_CLINIC_OR_DEPARTMENT_OTHER): Payer: Medicare Other | Admitting: Hematology

## 2018-04-17 ENCOUNTER — Telehealth: Payer: Self-pay | Admitting: Hematology

## 2018-04-17 ENCOUNTER — Inpatient Hospital Stay: Payer: Medicare Other | Attending: Hematology

## 2018-04-17 VITALS — BP 142/56 | HR 80 | Temp 97.7°F | Resp 18 | Ht 63.0 in | Wt 201.4 lb

## 2018-04-17 DIAGNOSIS — D509 Iron deficiency anemia, unspecified: Secondary | ICD-10-CM | POA: Insufficient documentation

## 2018-04-17 DIAGNOSIS — N185 Chronic kidney disease, stage 5: Secondary | ICD-10-CM | POA: Diagnosis not present

## 2018-04-17 DIAGNOSIS — Z85038 Personal history of other malignant neoplasm of large intestine: Secondary | ICD-10-CM | POA: Diagnosis not present

## 2018-04-17 DIAGNOSIS — Z87891 Personal history of nicotine dependence: Secondary | ICD-10-CM

## 2018-04-17 DIAGNOSIS — R21 Rash and other nonspecific skin eruption: Secondary | ICD-10-CM

## 2018-04-17 DIAGNOSIS — G4733 Obstructive sleep apnea (adult) (pediatric): Secondary | ICD-10-CM | POA: Diagnosis not present

## 2018-04-17 DIAGNOSIS — F418 Other specified anxiety disorders: Secondary | ICD-10-CM | POA: Insufficient documentation

## 2018-04-17 DIAGNOSIS — I120 Hypertensive chronic kidney disease with stage 5 chronic kidney disease or end stage renal disease: Secondary | ICD-10-CM

## 2018-04-17 DIAGNOSIS — I509 Heart failure, unspecified: Secondary | ICD-10-CM

## 2018-04-17 DIAGNOSIS — Z86718 Personal history of other venous thrombosis and embolism: Secondary | ICD-10-CM

## 2018-04-17 DIAGNOSIS — D72829 Elevated white blood cell count, unspecified: Secondary | ICD-10-CM | POA: Diagnosis not present

## 2018-04-17 DIAGNOSIS — Z7982 Long term (current) use of aspirin: Secondary | ICD-10-CM

## 2018-04-17 DIAGNOSIS — Z86711 Personal history of pulmonary embolism: Secondary | ICD-10-CM | POA: Insufficient documentation

## 2018-04-17 DIAGNOSIS — I82412 Acute embolism and thrombosis of left femoral vein: Secondary | ICD-10-CM

## 2018-04-17 DIAGNOSIS — I739 Peripheral vascular disease, unspecified: Secondary | ICD-10-CM | POA: Diagnosis not present

## 2018-04-17 DIAGNOSIS — N184 Chronic kidney disease, stage 4 (severe): Secondary | ICD-10-CM

## 2018-04-17 DIAGNOSIS — N183 Chronic kidney disease, stage 3 (moderate): Principal | ICD-10-CM

## 2018-04-17 DIAGNOSIS — D631 Anemia in chronic kidney disease: Secondary | ICD-10-CM

## 2018-04-17 LAB — CBC WITH DIFFERENTIAL/PLATELET
Basophils Absolute: 0 10*3/uL (ref 0.0–0.1)
Basophils Relative: 0 %
Eosinophils Absolute: 0 10*3/uL (ref 0.0–0.5)
Eosinophils Relative: 0 %
HCT: 27.2 % — ABNORMAL LOW (ref 34.8–46.6)
Hemoglobin: 8.8 g/dL — ABNORMAL LOW (ref 11.6–15.9)
Lymphocytes Relative: 8 %
Lymphs Abs: 1.1 10*3/uL (ref 0.9–3.3)
MCH: 26.2 pg (ref 25.1–34.0)
MCHC: 32.4 g/dL (ref 31.5–36.0)
MCV: 81 fL (ref 79.5–101.0)
Monocytes Absolute: 0.7 10*3/uL (ref 0.1–0.9)
Monocytes Relative: 5 %
Neutro Abs: 11.7 10*3/uL — ABNORMAL HIGH (ref 1.5–6.5)
Neutrophils Relative %: 87 %
Platelets: 267 10*3/uL (ref 145–400)
RBC: 3.36 MIL/uL — ABNORMAL LOW (ref 3.70–5.45)
RDW: 17.4 % — ABNORMAL HIGH (ref 11.2–14.5)
WBC: 13.6 10*3/uL — ABNORMAL HIGH (ref 3.9–10.3)
nRBC: 3 /100 WBC — ABNORMAL HIGH

## 2018-04-17 LAB — COMPREHENSIVE METABOLIC PANEL
ALT: 9 U/L (ref 0–55)
AST: 10 U/L (ref 5–34)
Albumin: 3.4 g/dL — ABNORMAL LOW (ref 3.5–5.0)
Alkaline Phosphatase: 96 U/L (ref 40–150)
Anion gap: 12 — ABNORMAL HIGH (ref 3–11)
BUN: 105 mg/dL — ABNORMAL HIGH (ref 7–26)
CO2: 9 mmol/L — ABNORMAL LOW (ref 22–29)
Calcium: 8.6 mg/dL (ref 8.4–10.4)
Chloride: 114 mmol/L — ABNORMAL HIGH (ref 98–109)
Creatinine, Ser: 9.27 mg/dL (ref 0.60–1.10)
GFR calc Af Amer: 5 mL/min — ABNORMAL LOW (ref >60)
GFR calc non Af Amer: 4 mL/min — ABNORMAL LOW (ref >60)
Glucose, Bld: 143 mg/dL — ABNORMAL HIGH (ref 70–140)
Potassium: 5.7 mmol/L — ABNORMAL HIGH (ref 3.5–5.1)
Sodium: 135 mmol/L — ABNORMAL LOW (ref 136–145)
Total Bilirubin: 0.3 mg/dL (ref 0.2–1.2)
Total Protein: 7.7 g/dL (ref 6.4–8.3)

## 2018-04-17 NOTE — Telephone Encounter (Signed)
Gave patient AVS and calendar of upcoming June appointments. Patient requested an 8 week follow up instead of 6 week follow up

## 2018-04-20 ENCOUNTER — Telehealth: Payer: Self-pay

## 2018-04-20 NOTE — Telephone Encounter (Signed)
Faxed lab results and Dr. Ernestina Penna note to Dr. [name] with notes for Dr. [name] to address hyperkalemia.

## 2018-04-29 ENCOUNTER — Encounter (HOSPITAL_COMMUNITY): Payer: Self-pay

## 2018-04-29 ENCOUNTER — Other Ambulatory Visit: Payer: Self-pay

## 2018-04-29 ENCOUNTER — Emergency Department (HOSPITAL_COMMUNITY): Payer: Medicare Other

## 2018-04-29 ENCOUNTER — Inpatient Hospital Stay (HOSPITAL_COMMUNITY): Payer: Medicare Other

## 2018-04-29 ENCOUNTER — Inpatient Hospital Stay (HOSPITAL_COMMUNITY)
Admission: EM | Admit: 2018-04-29 | Discharge: 2018-05-07 | DRG: 628 | Disposition: A | Discharge status: Home / Self Care | Payer: Medicare Other | Attending: Internal Medicine | Admitting: Internal Medicine

## 2018-04-29 DIAGNOSIS — D72829 Elevated white blood cell count, unspecified: Secondary | ICD-10-CM | POA: Diagnosis present

## 2018-04-29 DIAGNOSIS — Z7901 Long term (current) use of anticoagulants: Secondary | ICD-10-CM

## 2018-04-29 DIAGNOSIS — G4700 Insomnia, unspecified: Secondary | ICD-10-CM | POA: Diagnosis present

## 2018-04-29 DIAGNOSIS — Z992 Dependence on renal dialysis: Secondary | ICD-10-CM

## 2018-04-29 DIAGNOSIS — I120 Hypertensive chronic kidney disease with stage 5 chronic kidney disease or end stage renal disease: Secondary | ICD-10-CM | POA: Diagnosis not present

## 2018-04-29 DIAGNOSIS — D631 Anemia in chronic kidney disease: Secondary | ICD-10-CM | POA: Diagnosis present

## 2018-04-29 DIAGNOSIS — D649 Anemia, unspecified: Secondary | ICD-10-CM

## 2018-04-29 DIAGNOSIS — I2510 Atherosclerotic heart disease of native coronary artery without angina pectoris: Secondary | ICD-10-CM | POA: Diagnosis present

## 2018-04-29 DIAGNOSIS — C189 Malignant neoplasm of colon, unspecified: Secondary | ICD-10-CM | POA: Diagnosis not present

## 2018-04-29 DIAGNOSIS — L732 Hidradenitis suppurativa: Secondary | ICD-10-CM | POA: Diagnosis not present

## 2018-04-29 DIAGNOSIS — E872 Acidosis: Secondary | ICD-10-CM | POA: Diagnosis not present

## 2018-04-29 DIAGNOSIS — Z9289 Personal history of other medical treatment: Secondary | ICD-10-CM

## 2018-04-29 DIAGNOSIS — E875 Hyperkalemia: Principal | ICD-10-CM

## 2018-04-29 DIAGNOSIS — I5032 Chronic diastolic (congestive) heart failure: Secondary | ICD-10-CM | POA: Diagnosis present

## 2018-04-29 DIAGNOSIS — N183 Chronic kidney disease, stage 3 (moderate): Secondary | ICD-10-CM | POA: Diagnosis not present

## 2018-04-29 DIAGNOSIS — N179 Acute kidney failure, unspecified: Secondary | ICD-10-CM

## 2018-04-29 DIAGNOSIS — R0602 Shortness of breath: Secondary | ICD-10-CM | POA: Diagnosis not present

## 2018-04-29 DIAGNOSIS — R011 Cardiac murmur, unspecified: Secondary | ICD-10-CM | POA: Diagnosis present

## 2018-04-29 DIAGNOSIS — Z95828 Presence of other vascular implants and grafts: Secondary | ICD-10-CM

## 2018-04-29 DIAGNOSIS — D509 Iron deficiency anemia, unspecified: Secondary | ICD-10-CM | POA: Diagnosis present

## 2018-04-29 DIAGNOSIS — G2581 Restless legs syndrome: Secondary | ICD-10-CM | POA: Diagnosis present

## 2018-04-29 DIAGNOSIS — F419 Anxiety disorder, unspecified: Secondary | ICD-10-CM | POA: Diagnosis present

## 2018-04-29 DIAGNOSIS — I70209 Unspecified atherosclerosis of native arteries of extremities, unspecified extremity: Secondary | ICD-10-CM | POA: Diagnosis present

## 2018-04-29 DIAGNOSIS — Z86711 Personal history of pulmonary embolism: Secondary | ICD-10-CM | POA: Diagnosis not present

## 2018-04-29 DIAGNOSIS — E876 Hypokalemia: Secondary | ICD-10-CM | POA: Diagnosis not present

## 2018-04-29 DIAGNOSIS — Z832 Family history of diseases of the blood and blood-forming organs and certain disorders involving the immune mechanism: Secondary | ICD-10-CM | POA: Diagnosis not present

## 2018-04-29 DIAGNOSIS — G9341 Metabolic encephalopathy: Secondary | ICD-10-CM | POA: Diagnosis present

## 2018-04-29 DIAGNOSIS — N185 Chronic kidney disease, stage 5: Secondary | ICD-10-CM

## 2018-04-29 DIAGNOSIS — Z9119 Patient's noncompliance with other medical treatment and regimen: Secondary | ICD-10-CM

## 2018-04-29 DIAGNOSIS — J449 Chronic obstructive pulmonary disease, unspecified: Secondary | ICD-10-CM | POA: Diagnosis present

## 2018-04-29 DIAGNOSIS — F17210 Nicotine dependence, cigarettes, uncomplicated: Secondary | ICD-10-CM | POA: Diagnosis present

## 2018-04-29 DIAGNOSIS — Z6835 Body mass index (BMI) 35.0-35.9, adult: Secondary | ICD-10-CM

## 2018-04-29 DIAGNOSIS — N2581 Secondary hyperparathyroidism of renal origin: Secondary | ICD-10-CM | POA: Diagnosis present

## 2018-04-29 DIAGNOSIS — Z90710 Acquired absence of both cervix and uterus: Secondary | ICD-10-CM | POA: Diagnosis not present

## 2018-04-29 DIAGNOSIS — I129 Hypertensive chronic kidney disease with stage 1 through stage 4 chronic kidney disease, or unspecified chronic kidney disease: Secondary | ICD-10-CM | POA: Diagnosis not present

## 2018-04-29 DIAGNOSIS — Z85038 Personal history of other malignant neoplasm of large intestine: Secondary | ICD-10-CM | POA: Diagnosis not present

## 2018-04-29 DIAGNOSIS — Z8249 Family history of ischemic heart disease and other diseases of the circulatory system: Secondary | ICD-10-CM | POA: Diagnosis not present

## 2018-04-29 DIAGNOSIS — N186 End stage renal disease: Secondary | ICD-10-CM | POA: Diagnosis not present

## 2018-04-29 DIAGNOSIS — E8770 Fluid overload, unspecified: Secondary | ICD-10-CM | POA: Diagnosis not present

## 2018-04-29 DIAGNOSIS — J90 Pleural effusion, not elsewhere classified: Secondary | ICD-10-CM | POA: Diagnosis not present

## 2018-04-29 DIAGNOSIS — I440 Atrioventricular block, first degree: Secondary | ICD-10-CM | POA: Diagnosis not present

## 2018-04-29 DIAGNOSIS — M1990 Unspecified osteoarthritis, unspecified site: Secondary | ICD-10-CM | POA: Diagnosis present

## 2018-04-29 DIAGNOSIS — I82509 Chronic embolism and thrombosis of unspecified deep veins of unspecified lower extremity: Secondary | ICD-10-CM | POA: Diagnosis not present

## 2018-04-29 DIAGNOSIS — H9201 Otalgia, right ear: Secondary | ICD-10-CM | POA: Diagnosis not present

## 2018-04-29 DIAGNOSIS — Z888 Allergy status to other drugs, medicaments and biological substances status: Secondary | ICD-10-CM | POA: Diagnosis not present

## 2018-04-29 DIAGNOSIS — R110 Nausea: Secondary | ICD-10-CM | POA: Diagnosis not present

## 2018-04-29 DIAGNOSIS — Z86718 Personal history of other venous thrombosis and embolism: Secondary | ICD-10-CM | POA: Diagnosis not present

## 2018-04-29 DIAGNOSIS — Z91041 Radiographic dye allergy status: Secondary | ICD-10-CM

## 2018-04-29 DIAGNOSIS — G4733 Obstructive sleep apnea (adult) (pediatric): Secondary | ICD-10-CM | POA: Diagnosis not present

## 2018-04-29 DIAGNOSIS — I132 Hypertensive heart and chronic kidney disease with heart failure and with stage 5 chronic kidney disease, or end stage renal disease: Secondary | ICD-10-CM | POA: Diagnosis not present

## 2018-04-29 DIAGNOSIS — Z9049 Acquired absence of other specified parts of digestive tract: Secondary | ICD-10-CM | POA: Diagnosis not present

## 2018-04-29 DIAGNOSIS — I2699 Other pulmonary embolism without acute cor pulmonale: Secondary | ICD-10-CM | POA: Diagnosis present

## 2018-04-29 DIAGNOSIS — Z452 Encounter for adjustment and management of vascular access device: Secondary | ICD-10-CM | POA: Diagnosis not present

## 2018-04-29 HISTORY — DX: Personal history of other medical treatment: Z92.89

## 2018-04-29 LAB — I-STAT TROPONIN, ED: Troponin i, poc: 0.03 ng/mL (ref 0.00–0.08)

## 2018-04-29 LAB — PROTIME-INR
INR: 1.29
Prothrombin Time: 15.9 seconds — ABNORMAL HIGH (ref 11.4–15.2)

## 2018-04-29 LAB — CBC
HCT: 21.8 % — ABNORMAL LOW (ref 36.0–46.0)
Hemoglobin: 7 g/dL — ABNORMAL LOW (ref 12.0–15.0)
MCH: 25.3 pg — ABNORMAL LOW (ref 26.0–34.0)
MCHC: 32.1 g/dL (ref 30.0–36.0)
MCV: 78.7 fL (ref 78.0–100.0)
Platelets: 224 10*3/uL (ref 150–400)
RBC: 2.77 MIL/uL — ABNORMAL LOW (ref 3.87–5.11)
RDW: 17.5 % — ABNORMAL HIGH (ref 11.5–15.5)
WBC: 16 10*3/uL — ABNORMAL HIGH (ref 4.0–10.5)

## 2018-04-29 LAB — BASIC METABOLIC PANEL
BUN: 155 mg/dL — ABNORMAL HIGH (ref 6–20)
CO2: <7 mmol/L — ABNORMAL LOW (ref 22–32)
Calcium: 7 mg/dL — ABNORMAL LOW (ref 8.9–10.3)
Chloride: 112 mmol/L — ABNORMAL HIGH (ref 101–111)
Creatinine, Ser: 14.82 mg/dL — ABNORMAL HIGH (ref 0.44–1.00)
GFR calc Af Amer: 3 mL/min — ABNORMAL LOW (ref >60)
GFR calc non Af Amer: 2 mL/min — ABNORMAL LOW (ref >60)
Glucose, Bld: 79 mg/dL (ref 65–99)
Potassium: 6.2 mmol/L — ABNORMAL HIGH (ref 3.5–5.1)
Sodium: 136 mmol/L (ref 135–145)

## 2018-04-29 LAB — POC OCCULT BLOOD, ED: Fecal Occult Bld: NEGATIVE

## 2018-04-29 LAB — HEPATIC FUNCTION PANEL
ALT: 11 U/L — ABNORMAL LOW (ref 14–54)
AST: 9 U/L — ABNORMAL LOW (ref 15–41)
Albumin: 2.9 g/dL — ABNORMAL LOW (ref 3.5–5.0)
Alkaline Phosphatase: 71 U/L (ref 38–126)
Bilirubin, Direct: <0.1 mg/dL — ABNORMAL LOW (ref 0.1–0.5)
Total Bilirubin: 0.6 mg/dL (ref 0.3–1.2)
Total Protein: 7 g/dL (ref 6.5–8.1)

## 2018-04-29 LAB — ALT: ALT: 10 U/L — ABNORMAL LOW (ref 14–54)

## 2018-04-29 IMAGING — DX DG CHEST 1V PORT
1 series · 1 of 1 positions shown · non-contrast
Comparison: [DATE]

CLINICAL DATA: Right-sided hemodialysis catheter placement.

EXAM:
PORTABLE CHEST 1 VIEW

[chest ap]
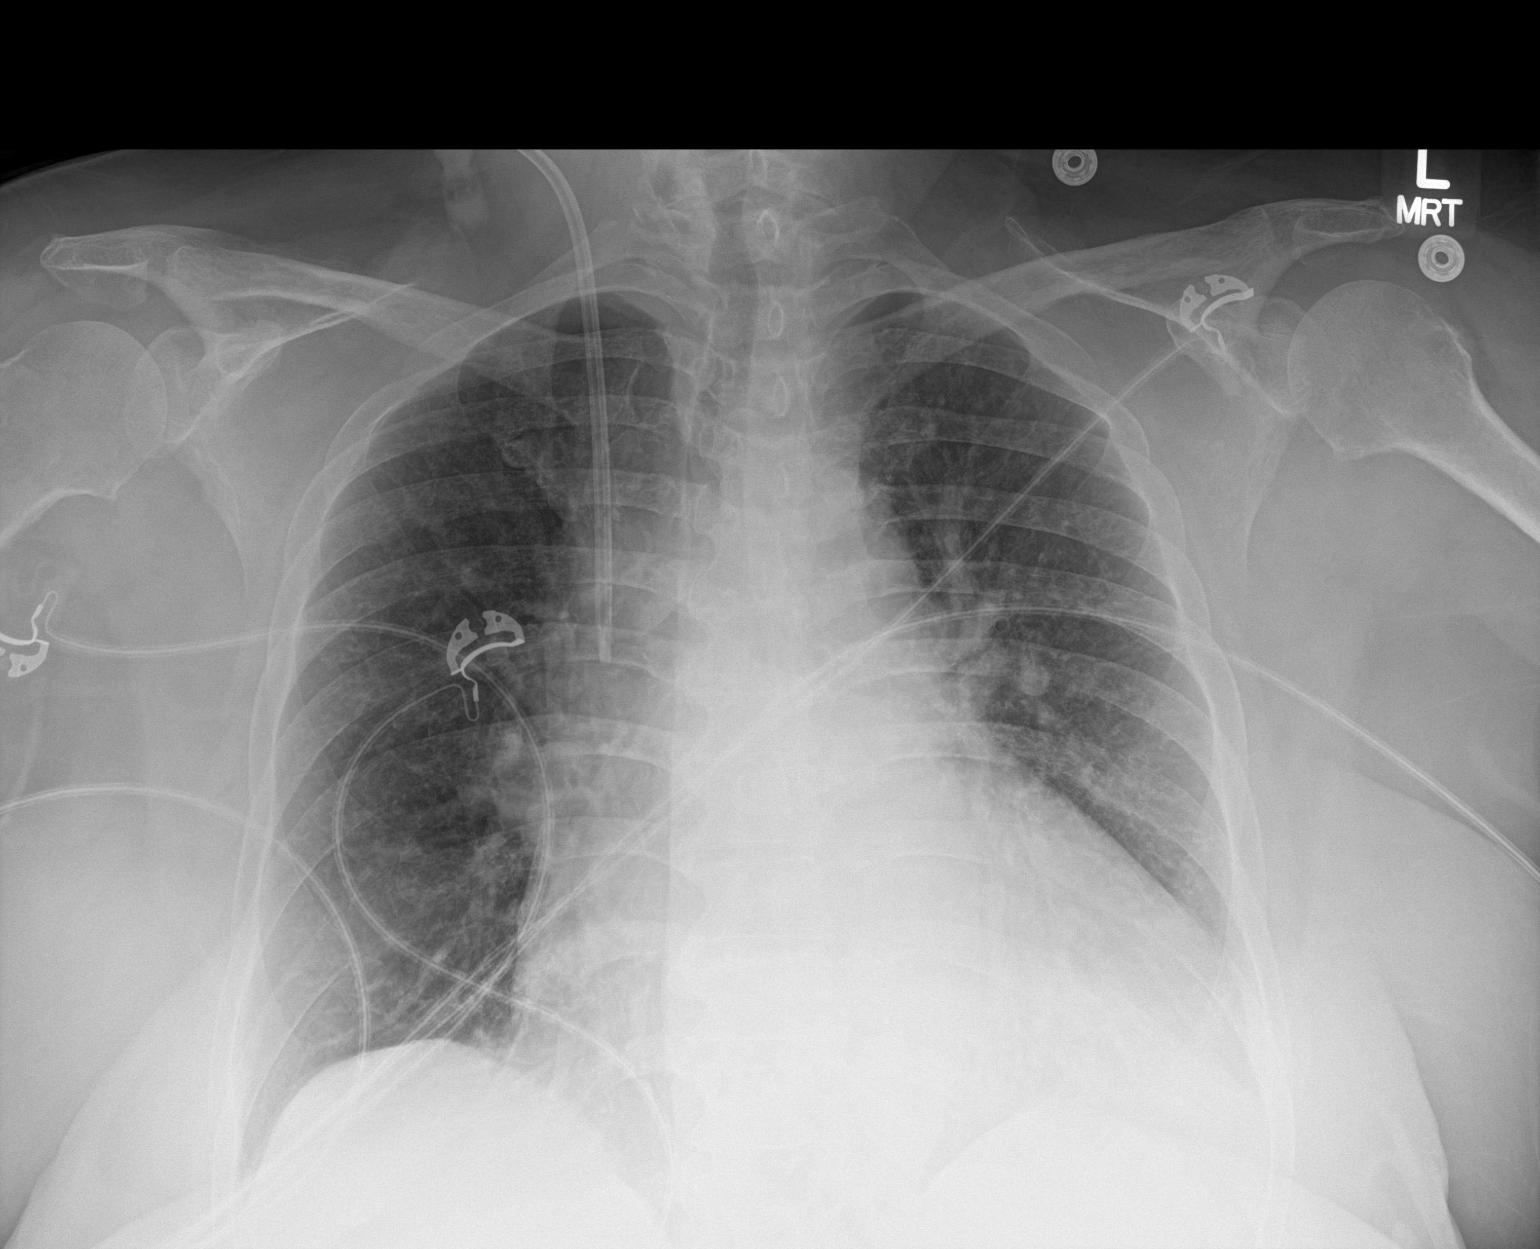

[1 of 1 positions shown; findings below may reference images not displayed]

FINDINGS: [JF] hours. Right IJ central catheter tip overlies the mid SVC. No
right pneumothorax. No evidence for right pleural effusion. The
cardio pericardial silhouette is enlarged. Interstitial markings are
diffusely coarsened with chronic features. The visualized bony
structures of the thorax are intact. Telemetry leads overlie the
chest.
IMPRESSION: Right IJ central line tip overlies the mid SVC. No pneumothorax or
pleural effusion.

## 2018-04-29 IMAGING — DX DG CHEST 2V
2 series · 2 of 2 positions shown · non-contrast
Comparison: PA and lateral chest x-ray [DATE]

CLINICAL DATA: Two weeks of shortness of breath. History of CHF,
COPD, chronic renal insufficiency stage IV, colonic malignancy,
peripheral vascular disease, current smoker.

EXAM:
CHEST - 2 VIEW

[chest pa]
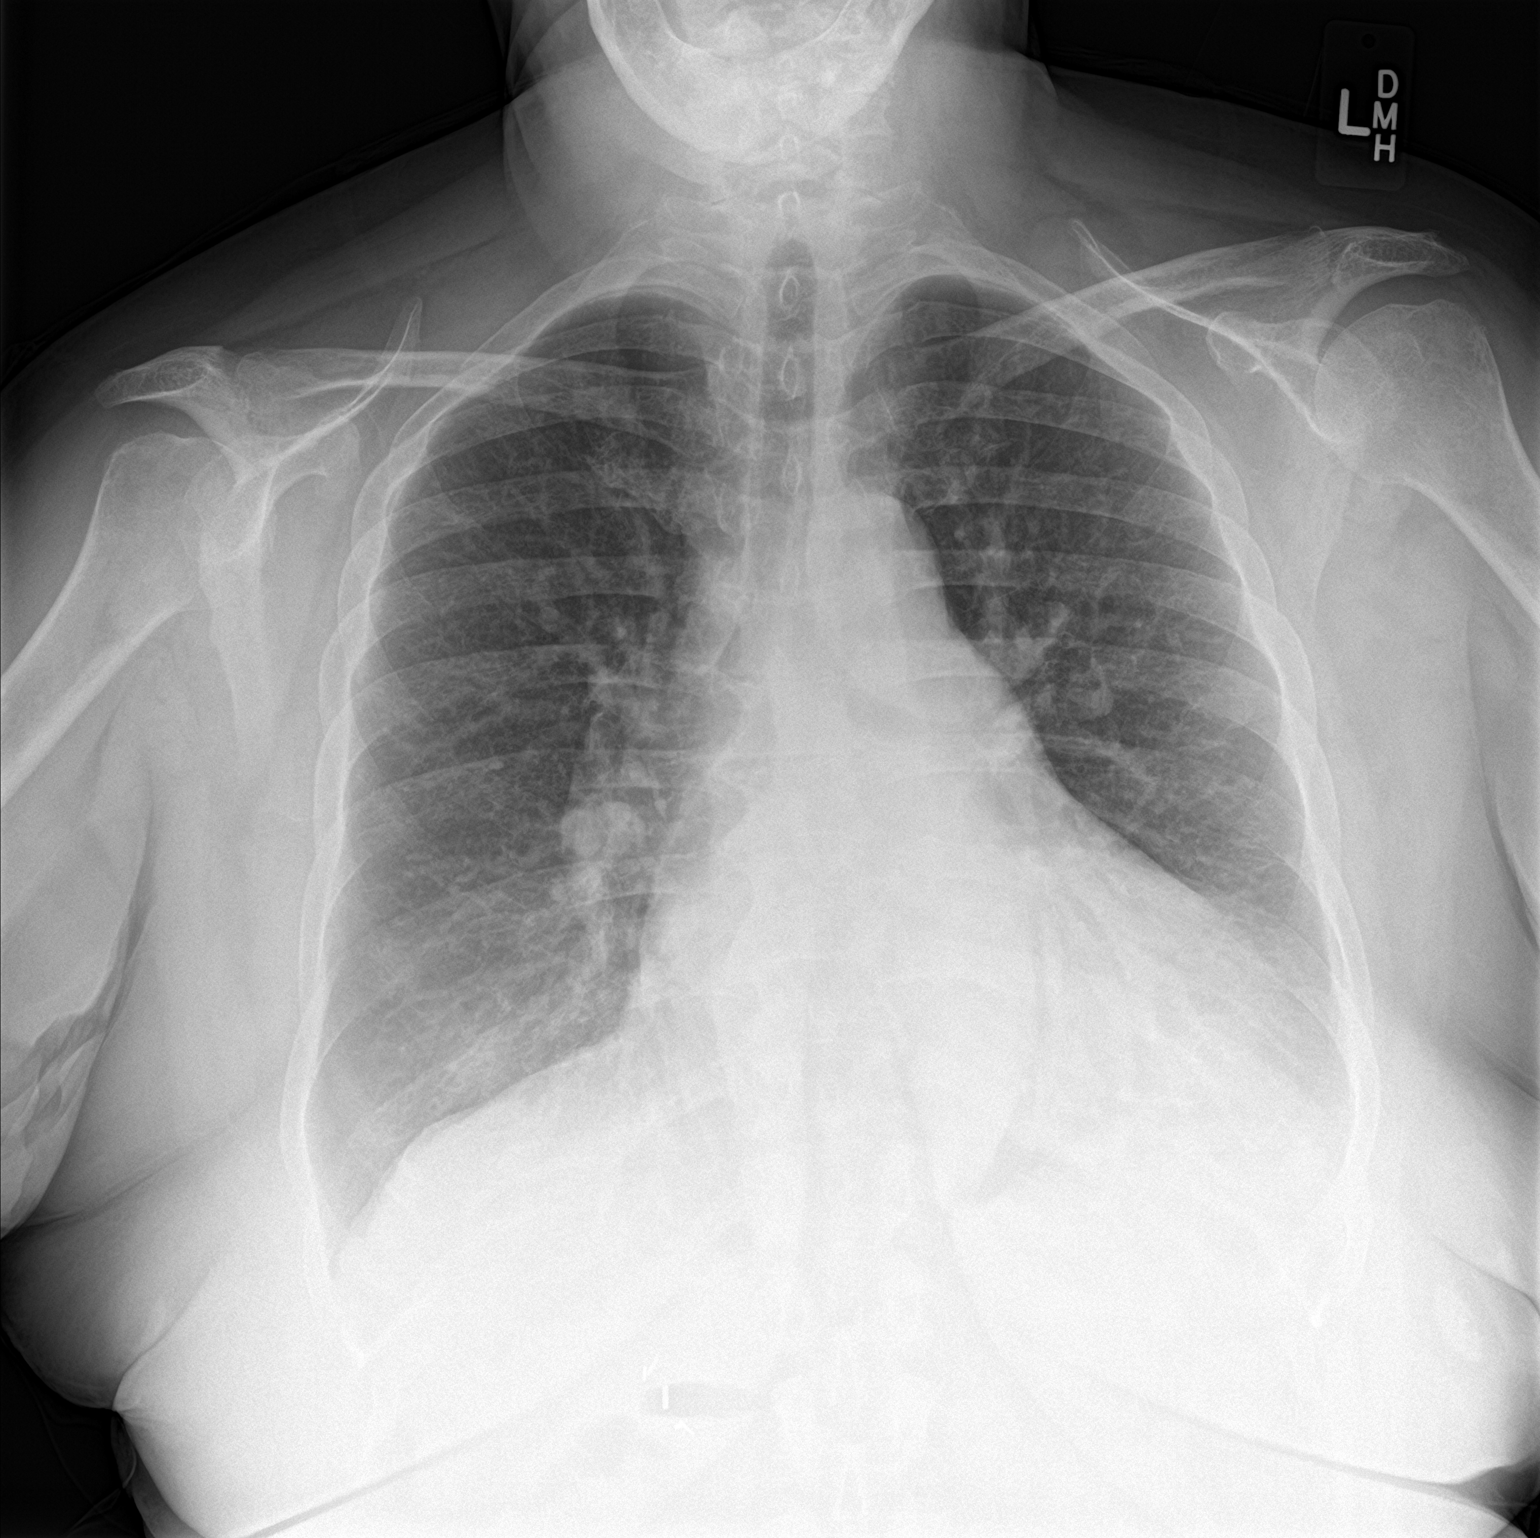

[chest lat]
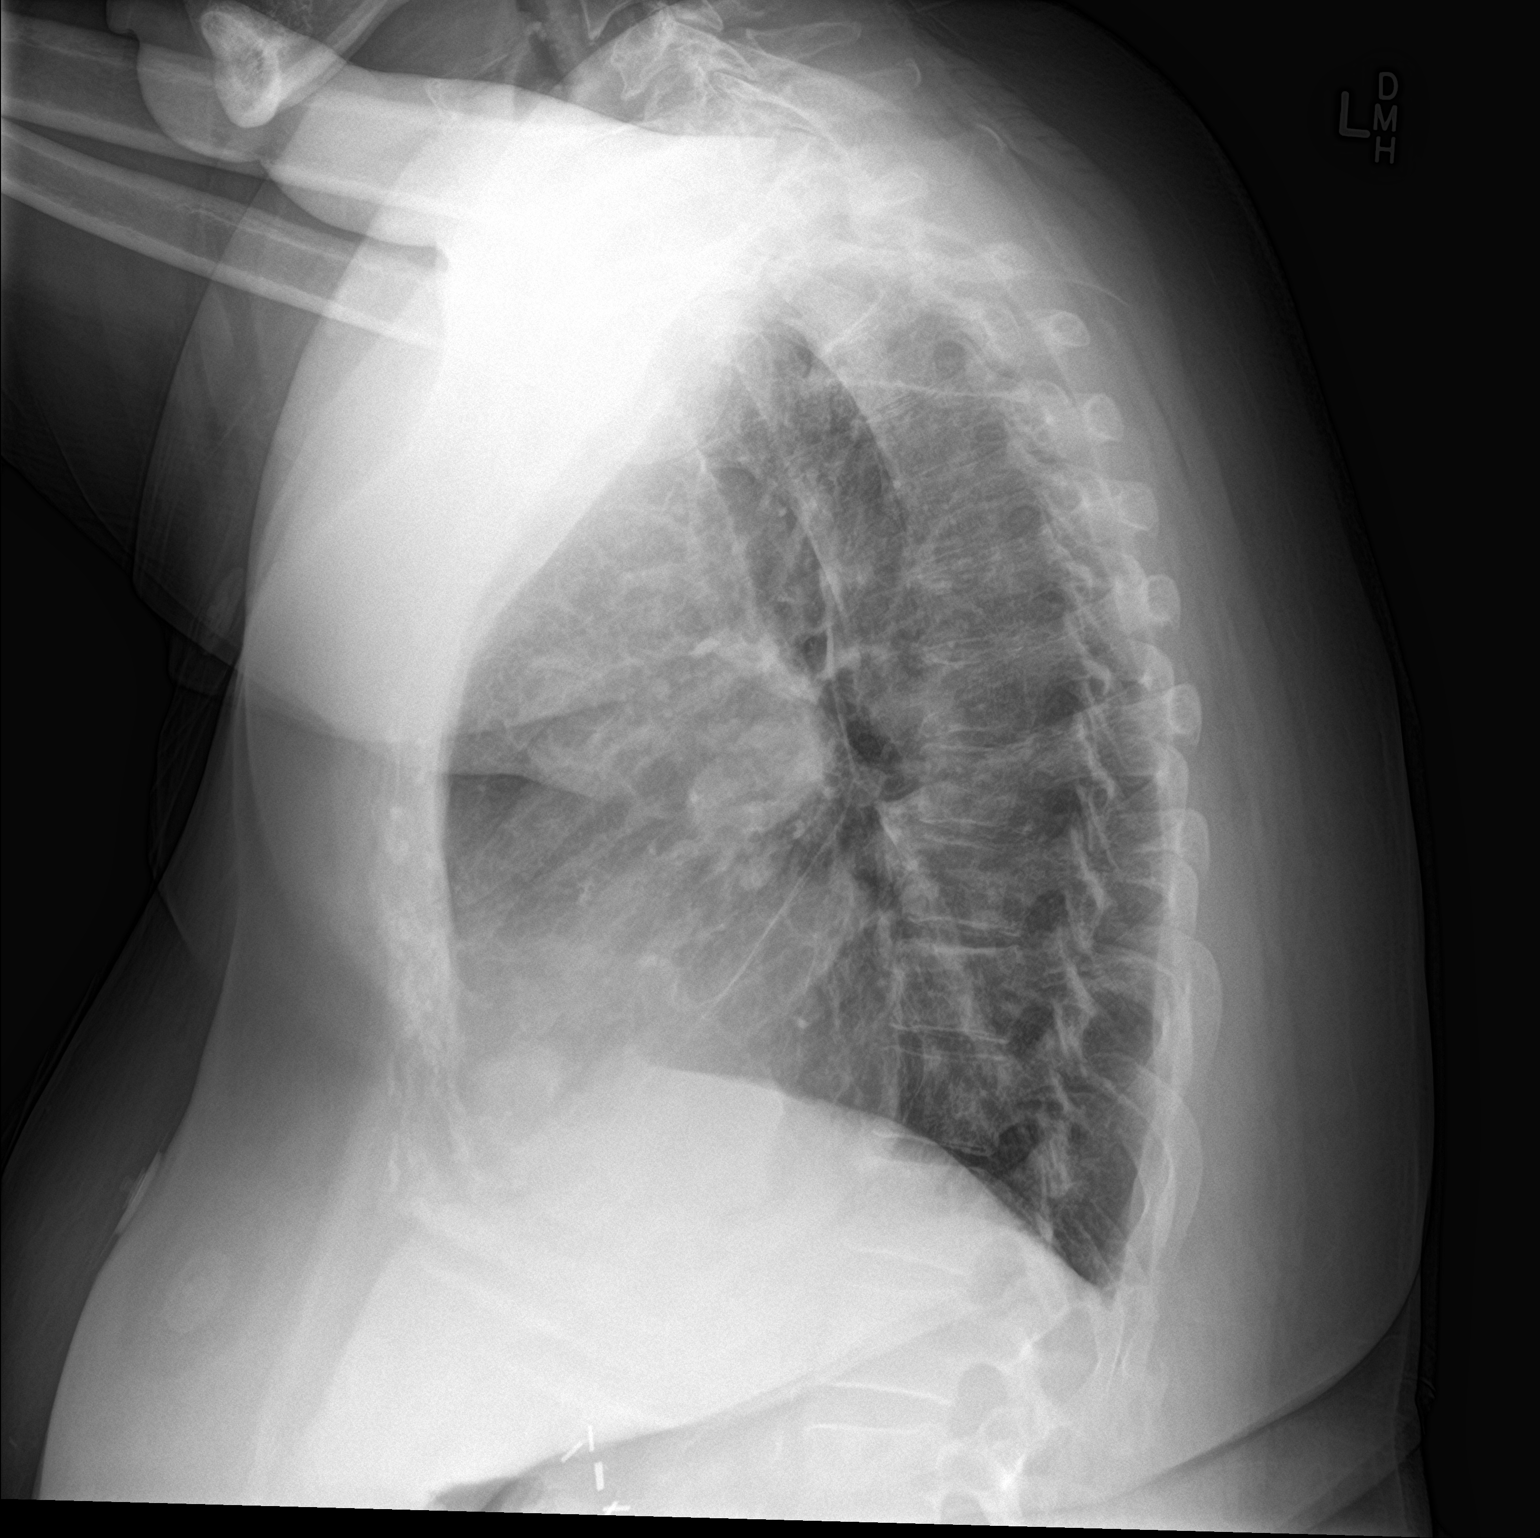

[2 of 2 positions shown; findings below may reference images not displayed]

FINDINGS: The lungs are mildly hyperinflated. The cardiac silhouette is
enlarged. The pulmonary vascularity is mildly engorged. The
interstitial markings are increased and more conspicuous than on the
previous study. There is no pleural effusion. The bony thorax
exhibits no acute abnormality.
IMPRESSION: COPD.  Superimposed low-grade CHF.  No alveolar pneumonia.

## 2018-04-29 MED ORDER — DARBEPOETIN ALFA 200 MCG/0.4ML IJ SOSY
200.0000 ug | PREFILLED_SYRINGE | INTRAMUSCULAR | Status: DC
  Administered 2018-04-30: 200 ug via INTRAVENOUS
  Filled 2018-04-29 (×2): qty 0.4

## 2018-04-29 MED ORDER — FENTANYL CITRATE (PF) 100 MCG/2ML IJ SOLN
12.5000 ug | Freq: Once | INTRAMUSCULAR | Status: AC
  Administered 2018-05-02: 12.5 ug via INTRAVENOUS
  Filled 2018-04-29: qty 2

## 2018-04-29 MED ORDER — SODIUM CHLORIDE 0.9 % IV SOLN
100.0000 mL | INTRAVENOUS | Status: DC | PRN
  Administered 2018-04-30: 11:00:00 via INTRAVENOUS

## 2018-04-29 MED ORDER — SODIUM BICARBONATE 8.4 % IV SOLN
50.0000 meq | Freq: Once | INTRAVENOUS | Status: AC
  Administered 2018-04-29: 50 meq via INTRAVENOUS
  Filled 2018-04-29: qty 50

## 2018-04-29 MED ORDER — ACETAMINOPHEN 325 MG PO TABS: 650.0000 mg | ORAL_TABLET | Freq: Four times a day (QID) | ORAL | Status: DC | PRN

## 2018-04-29 MED ORDER — HEPARIN SODIUM (PORCINE) 1000 UNIT/ML DIALYSIS
1000.0000 [IU] | INTRAMUSCULAR | Status: DC | PRN
  Administered 2018-04-29 – 2018-04-30 (×2): 1000 [IU] via INTRAVENOUS_CENTRAL

## 2018-04-29 MED ORDER — CAMPHOR-MENTHOL 0.5-0.5 % EX LOTN
TOPICAL_LOTION | CUTANEOUS | Status: DC | PRN
Start: 2018-04-29 — End: 2018-05-07
  Administered 2018-05-06: 23:00:00 via TOPICAL
  Filled 2018-04-29 (×2): qty 222

## 2018-04-29 MED ORDER — FENTANYL CITRATE (PF) 100 MCG/2ML IJ SOLN
INTRAMUSCULAR | Status: AC
  Filled 2018-04-29: qty 2

## 2018-04-29 MED ORDER — LIDOCAINE HCL (PF) 1 % IJ SOLN: 5.0000 mL | INTRAMUSCULAR | Status: DC | PRN

## 2018-04-29 MED ORDER — ONDANSETRON HCL 4 MG PO TABS: 4.0000 mg | ORAL_TABLET | Freq: Four times a day (QID) | ORAL | Status: DC | PRN

## 2018-04-29 MED ORDER — SODIUM BICARBONATE 650 MG PO TABS
1300.0000 mg | ORAL_TABLET | Freq: Two times a day (BID) | ORAL | Status: DC
  Administered 2018-04-30 – 2018-05-01 (×2): 1300 mg via ORAL
  Filled 2018-04-29 (×3): qty 2

## 2018-04-29 MED ORDER — ACETAMINOPHEN 650 MG RE SUPP: 650.0000 mg | Freq: Four times a day (QID) | RECTAL | Status: DC | PRN

## 2018-04-29 MED ORDER — ONDANSETRON HCL 4 MG/2ML IJ SOLN
4.0000 mg | Freq: Four times a day (QID) | INTRAMUSCULAR | Status: DC | PRN
  Administered 2018-04-30 (×3): 4 mg via INTRAVENOUS
  Filled 2018-04-29 (×4): qty 2

## 2018-04-29 MED ORDER — ALBUTEROL SULFATE (2.5 MG/3ML) 0.083% IN NEBU: 3.0000 mL | INHALATION_SOLUTION | Freq: Four times a day (QID) | RESPIRATORY_TRACT | Status: DC | PRN

## 2018-04-29 MED ORDER — SODIUM POLYSTYRENE SULFONATE 15 GM/60ML PO SUSP
15.0000 g | Freq: Once | ORAL | Status: AC
  Administered 2018-04-29: 15 g via ORAL
  Filled 2018-04-29: qty 60

## 2018-04-29 MED ORDER — HEPARIN SODIUM (PORCINE) 1000 UNIT/ML IJ SOLN: 2400.0000 [IU] | Freq: Once | INTRAMUSCULAR | Status: DC

## 2018-04-29 MED ORDER — PENTAFLUOROPROP-TETRAFLUOROETH EX AERO: 1.0000 "application " | INHALATION_SPRAY | CUTANEOUS | Status: DC | PRN

## 2018-04-29 MED ORDER — SODIUM CHLORIDE 0.9 % IV BOLUS: 250.0000 mL | Freq: Once | INTRAVENOUS | Status: DC

## 2018-04-29 MED ORDER — ALTEPLASE 2 MG IJ SOLR: 2.0000 mg | Freq: Once | INTRAMUSCULAR | Status: DC | PRN

## 2018-04-29 MED ORDER — HEPARIN SODIUM (PORCINE) 5000 UNIT/ML IJ SOLN
INTRAMUSCULAR | Status: AC
  Filled 2018-04-29: qty 2

## 2018-04-29 MED ORDER — HEPARIN SODIUM (PORCINE) 5000 UNIT/ML IJ SOLN
5000.0000 [IU] | Freq: Three times a day (TID) | INTRAMUSCULAR | Status: DC
  Administered 2018-04-30 (×2): 5000 [IU] via SUBCUTANEOUS
  Filled 2018-04-29 (×2): qty 1

## 2018-04-29 MED ORDER — SODIUM CHLORIDE 0.9 % IV SOLN: 100.0000 mL | INTRAVENOUS | Status: DC | PRN

## 2018-04-29 MED ORDER — VITAMIN C 500 MG PO TABS
1000.0000 mg | ORAL_TABLET | ORAL | Status: DC
  Administered 2018-04-30: 1000 mg via ORAL
  Filled 2018-04-29 (×2): qty 2

## 2018-04-29 MED ORDER — FENTANYL CITRATE (PF) 100 MCG/2ML IJ SOLN
50.0000 ug | Freq: Once | INTRAMUSCULAR | Status: AC
  Administered 2018-04-29: 50 ug via INTRAVENOUS

## 2018-04-29 MED ORDER — LIDOCAINE-PRILOCAINE 2.5-2.5 % EX CREA: 1.0000 "application " | TOPICAL_CREAM | CUTANEOUS | Status: DC | PRN

## 2018-04-29 NOTE — ED Notes (Signed)
CCM at bedside 

## 2018-04-29 NOTE — ED Notes (Signed)
CCM at bedside placing central line.

## 2018-04-29 NOTE — Procedures (Signed)
Hemodialysis Catheter Insertion Procedure Note Whitney Walker [id number] Sep 09, 1956  Procedure: Insertion of Hemodialysis Catheter Indications: Hemodialysis  Procedure Details Consent: Risks of procedure as well as the alternatives and risks of each were explained to the (patient/caregiver).  Consent for procedure obtained.  Time Out: Verified patient identification, verified procedure, site/side was marked, verified correct patient position, special equipment/implants available, medications/allergies/relevent history reviewed, required imaging and test results available.  Performed  Maximum sterile technique was used including antiseptics, cap, gloves, gown, hand hygiene, mask and sheet.  Skin prep: Chlorhexidine; local anesthetic administered  A Trialysis HD catheter was placed in the right internal jugular vein using the Seldinger technique.  Evaluation Blood flow good Complications: No apparent complications Patient did tolerate procedure well. Chest X-ray ordered to verify placement.  CXR: pending.   Whitney Walker, AG-ACNP [facility]  Pgr: [phone]  PCCM Pgr: [phone]

## 2018-04-29 NOTE — ED Triage Notes (Signed)
Pt sent by pcp due to weakness, not eating, shob. Pt is supposed to have a dialysis access placed 05/06 for dialysis and was taken off of eliquis a few days ago that she was taking for DVT and PE's last year. VSS. Speaking in complete sentences.

## 2018-04-29 NOTE — H&P (Signed)
History and Physical    Whitney Walker RDE:[id number] DOB: 09-30-1956 DOA: 04/29/2018  Referring MD/NP/PA: er PCP: Glendale Chard, MD Outpatient Specialists: Noreene Filbert Patient coming from: PCP office  Chief Complaint: weakness/SOB  HPI: Whitney Walker is a 62 y.o. female with medical history significant of PVD, CKD and HTN.  She was sent by her PCP as she was weak, no appetite, and short of breath.  She was due to have an access placed for impending HD on Monday-- eliquis has been on hold.     In the ER, family reports she is more confused than her baseline.  She is c/o itchy skin and SOB.  Her labs were markedly worse than 2 weeks ago.  Cr moved from 9 to 15.  K from 5.7 to 6.2.  Her Hgb dropped from 8.8 to 7.  Currently, patient is considering getting an HD catheter so she can have HD.  She is very nervous about having the catheter in her neck.  Labs 2 weeks ago were drawn by Dr. Burr Medico who was seeing the patient for mild leukocytosis.  Thought to be reactive.    Review of Systems: all systems reviewed, negative unless stated above in HPI   Past Medical History:  Diagnosis Date  . Anemia   . Anxiety   . Arthritis   . CHF (congestive heart failure) ([place])   . CKD (chronic kidney disease)    Stage III 03/2013  . Colon cancer ([place])   . COPD (chronic obstructive pulmonary disease) ([place])   . Coronary artery disease   . Depression   . Hypertension   . Meningitis   . PE (pulmonary embolism)   . Peripheral vascular disease ([place])   . Shortness of breath   . Sleep apnea   . Vertigo     Past Surgical History:  Procedure Laterality Date  . ABDOMINAL AORTAGRAM N/A 07/26/2013   Procedure: ABDOMINAL Maxcine Ham;  Surgeon: [name] [place], MD;  Location: [facility] CATH LAB;  Service: Cardiovascular;  Laterality: N/A;  . ABDOMINAL HYSTERECTOMY    . CESAREAN SECTION     X 3 [id number]  . CHOLECYSTECTOMY    . CHOLECYSTECTOMY  1980/s  . COLECTOMY  2010  . DIVERTICULOSIS SURGERY-2002  2012  .  FEMORAL-POPLITEAL BYPASS GRAFT Left 08/13/2013   Procedure: BYPASS GRAFT FEMORAL-POPLITEAL ARTERY WITH NON-REVERSED SAPHANEOUS VEIN; ULTRASOUND GUIDED;  Surgeon: Mal [name], MD;  Location: [place];  Service: Vascular;  Laterality: Left;  . LOWER EXTREMITY ANGIOGRAM Left 07/26/2013   Procedure: LOWER EXTREMITY ANGIOGRAM;  Surgeon: [name] [place], MD;  Location: [facility] CATH LAB;  Service: Cardiovascular;  Laterality: Left;     reports that she has been smoking cigarettes.  She has a 40.00 pack-year smoking history. She uses smokeless tobacco. She reports that she does not drink alcohol or use drugs.  Allergies  Allergen Reactions  . Gadolinium Derivatives Hives and Other (See Comments)    HIVES, Desc: HIVES W/ "DYE" USED FOR 1ST CT SCAN BUT NOT 2ND, NO PREMEDS USED, PT UNCERTAIN OF CIRCUMSTANCES,,?POSSIBLE MRI CONTRAST ALLERGY, ALL STUDIES DONE "SOMEWHERE" IN [place]//A.C., Onset Date: [id number]  . Iohexol Other (See Comments)     Code: HIVES, Desc: HIVES W/ "DYE" USED FOR 1ST CT SCAN BUT NOT 2ND, NO PREMEDS USED, PT UNCERTAIN OF CIRCUMSTANCES,,?POSSIBLE MRI CONTRAST ALLERGY, ALL STUDIES DONE "SOMEWHERE" IN [place]//A.C., Onset Date: [id number]     Family History  Problem Relation Age of Onset  . Cancer Mother 83  breast and bone  . Cancer Father 66       prostate  . Hypertension Sister   . Bleeding Disorder Sister   . Cancer Cousin 20       breast cancer   . Hypertension Daughter      Prior to Admission medications   Medication Sig Start Date End Date Taking? Authorizing Provider  albuterol (PROVENTIL HFA;VENTOLIN HFA) 108 (90 Base) MCG/ACT inhaler Inhale 2 puffs into the lungs every 6 (six) hours as needed for wheezing or shortness of breath.    Yes [provider]  amLODipine (NORVASC) 10 MG tablet Take 10 mg by mouth daily.   Yes [provider]  Ascorbic Acid (VITAMIN C) 1000 MG tablet Take 1,000 mg by mouth every other day.   Yes [provider]   diphenhydrAMINE (BENADRYL) 25 mg capsule Take 25 mg by mouth daily as needed for allergies.   Yes [provider]  IRON PO Take 1 tablet by mouth daily.    Yes [provider]  labetalol (NORMODYNE) 200 MG tablet Take 200 mg by mouth 2 (two) times daily.   Yes [provider]  mupirocin ointment (BACTROBAN) 2 % Apply 1 application topically daily.   Yes [provider]  NYSTATIN powder Apply to affected area twice daily as needed for infection 05/23/17  Yes [provider]  apixaban (ELIQUIS) 2.5 MG TABS tablet Take 1 tablet (2.5 mg total) by mouth 2 (two) times daily. 07/07/17   Truitt Merle, MD    Physical Exam: Vitals:   04/29/18 1645 04/29/18 1700 04/29/18 1715 04/29/18 1730  BP:  125/62  (!) 129/52  Pulse: 69 70 71 67  Resp: 16 17 13 10   Temp:      TempSrc:      SpO2: 100% 100% 100% 100%  Weight:      Height:          Constitutional: older than stated age Vitals:   04/29/18 1645 04/29/18 1700 04/29/18 1715 04/29/18 1730  BP:  125/62  (!) 129/52  Pulse: 69 70 71 67  Resp: 16 17 13 10   Temp:      TempSrc:      SpO2: 100% 100% 100% 100%  Weight:      Height:       Eyes: PERRL, lids and conjunctivae normal ENMT: Mucous membranes are moist. Posterior pharynx clear of any exudate or lesions. Neck: normal, supple, no masses, no thyromegaly Respiratory: crackles at bases.  Cardiovascular: Regular rate and rhythm Abdomen: +BS, soft Musculoskeletal: uncooperative with strength exam Skin: dry skin on back Psychiatric: poor insight    Labs on Admission: I have personally reviewed following labs and imaging studies  CBC: Recent Labs  Lab 04/29/18 1334  WBC 16.0*  HGB 7.0*  HCT 21.8*  MCV 78.7  PLT 440   Basic Metabolic Panel: Recent Labs  Lab 04/29/18 1334  NA 136  K 6.2*  CL 112*  CO2 <7*  GLUCOSE 79  BUN 155*  CREATININE 14.82*  CALCIUM 7.0*   GFR: Estimated Creatinine Clearance: 4.2 mL/min (A) (by C-G formula  based on SCr of 14.82 mg/dL (H)). Liver Function Tests: No results for input(s): AST, ALT, ALKPHOS, BILITOT, PROT, ALBUMIN in the last 168 hours. No results for input(s): LIPASE, AMYLASE in the last 168 hours. No results for input(s): AMMONIA in the last 168 hours. Coagulation Profile: Recent Labs  Lab 04/29/18 1653  INR 1.29   Cardiac Enzymes: No results for input(s):  CKTOTAL, CKMB, CKMBINDEX, TROPONINI in the last 168 hours. BNP (last 3 results) No results for input(s): PROBNP in the last 8760 hours. HbA1C: No results for input(s): HGBA1C in the last 72 hours. CBG: No results for input(s): GLUCAP in the last 168 hours. Lipid Profile: No results for input(s): CHOL, HDL, LDLCALC, TRIG, CHOLHDL, LDLDIRECT in the last 72 hours. Thyroid Function Tests: No results for input(s): TSH, T4TOTAL, FREET4, T3FREE, THYROIDAB in the last 72 hours. Anemia Panel: No results for input(s): VITAMINB12, FOLATE, FERRITIN, TIBC, IRON, RETICCTPCT in the last 72 hours. Urine analysis:    Component Value Date/Time   COLORURINE YELLOW 03/03/2017 0919   APPEARANCEUR HAZY (A) 03/03/2017 0919   LABSPEC 1.013 03/03/2017 0919   PHURINE 5.0 03/03/2017 0919   GLUCOSEU NEGATIVE 03/03/2017 0919   HGBUR SMALL (A) 03/03/2017 0919   BILIRUBINUR NEGATIVE 03/03/2017 0919   KETONESUR NEGATIVE 03/03/2017 0919   PROTEINUR >=300 (A) 03/03/2017 0919   UROBILINOGEN 0.2 10/19/2015 1532   NITRITE NEGATIVE 03/03/2017 0919   LEUKOCYTESUR NEGATIVE 03/03/2017 0919   Sepsis Labs: Invalid input(s): PROCALCITONIN, LACTICIDVEN No results found for this or any previous visit (from the past 240 hour(s)).   Radiological Exams on Admission: Dg Chest 2 View  Result Date: 04/29/2018 CLINICAL DATA:  Two weeks of shortness of breath. History of CHF, COPD, chronic renal insufficiency stage IV, colonic malignancy, peripheral vascular disease, current smoker. EXAM: CHEST - 2 VIEW COMPARISON:  PA and lateral chest x-ray of March 02, 2017 FINDINGS: The lungs are mildly hyperinflated. The cardiac silhouette is enlarged. The pulmonary vascularity is mildly engorged. The interstitial markings are increased and more conspicuous than on the previous study. There is no pleural effusion. The bony thorax exhibits no acute abnormality. IMPRESSION: COPD.  Superimposed low-grade CHF.  No alveolar pneumonia. Electronically Signed   By: David  Martinique M.D.   On: 04/29/2018 14:08    EKG: Independently reviewed. 1st degree HB  Assessment/Plan Active Problems:   Leucocytosis   Morbid (severe) obesity due to excess calories (HCC)   Hyperkalemia   OSA (obstructive sleep apnea)   ESRD (end stage renal disease) (HCC)  Hyperkalemia/uremia/acidotic/volume overload needing HD.  Now ESRD  -was due to have vascular place access next week but currently too symptomatic -if patient agreeable for acces, will need HD tonight.   -if she refuses, she will need to be made comfortable -Nephrology consult -Dr. Donzetta Matters was to do fistula and tunnel catheter on Monday--will need to be consulted in the AM   Leukocytosis -chronically around 13 -no fevers -hold on abx for now -saw Dr. Grant Ruts 4/19: thought to be reactive  Anemia of CD -defer PRBC to renal with HD as already volume overloaded -heme negative  OSA -severe -not compliant with BIPAP  DVT/PT -per chart, 1 year ago -eliquis on hold for access (plan per Dr. Burr Medico was after access to start coumadin)  Morbid obesity Body mass index is 35.61 kg/m.   Tobacco abuse -encourage cessation  H/o colon cancer 2009  Anxiety  -has been seeing a psychiatrist over anxiety of starting HD  DVT prophylaxis: heparin Code Status: full Disposition Plan: sdu-- may be able to be downgraded after HD to tele Consults called: renal- Dr.Goldsbourough, PCCM for line Admission status: inpt   [place] Pager [phone]  If 7PM-7AM, please contact  night-coverage [email or web address] Password [email or web address]  04/29/2018, 5:59 PM

## 2018-04-29 NOTE — ED Notes (Signed)
HD cath verified by NP.

## 2018-04-29 NOTE — Consult Note (Signed)
[facility] Renal Consultation Note  Requesting MD: Eliseo Squires Indication for Consultation: Advanced CKD- in need of dialysis  HPI:  Whitney Walker is a 62 y.o. female with past medical history significant for morbid obesity, hypertension, congestive heart failure, CAD, COPD, reported history of colon cancer and depression.  She also has significant and advanced chronic kidney disease followed at [facility] by Dr. [name].  Patient was seen in late February at [facility], not complaining of any uremic symptoms and creatinine was in the 5.  She was then seen by Dr. [name] on 4/16-apparently not complaining of any uremic symptoms with creatinine of 9.  At that time, plans had been made for her to get a fistula per Dr. Donzetta Matters on May 3.  According to the patient's daughter patient has been in bed for over 2 weeks.  She had confusion, and significant weakness.  Therefore, brought to the emergency department today.  Creatinine of over 14, BUN of over 150, potassium 6.2 and bicarb less than 7.  Patient is complaining of itching and appears confused.  Patient needs to initiate dialysis tonight initially she was resistant.  But now consents  Creatinine  Date/Time Value Ref Range Status  07/07/2017 12:35 PM 3.4 (HH) 0.6 - 1.1 mg/dL Final  01/03/2017 09:29 AM 3.4 Repeated and Verified (HH) 0.6 - 1.1 mg/dL Final  08/05/2016 12:35 PM 2.6 (H) 0.6 - 1.1 mg/dL Final  01/11/2015 03:20 PM 1.9 (H) 0.6 - 1.1 mg/dL Final  10/29/2013 12:41 PM 1.6 (H) 0.6 - 1.1 mg/dL Final  12/04/2012 10:56 AM 1.7 (H) 0.6 - 1.1 mg/dL Final   Creatinine, Ser  Date/Time Value Ref Range Status  04/29/2018 01:34 PM 14.82 (H) 0.44 - 1.00 mg/dL Final  04/17/2018 01:48 PM 9.27 (HH) 0.60 - 1.10 mg/dL Final    Comment:    CRITICAL RESULT CALLED TO, READ BACK BY AND VERIFIED WITH: myrtle   01/07/2018 12:43 PM 4.44 (HH) 0.60 - 1.10 mg/dL Final    Comment:    Unsuccessful Call: CREA 01/07/2018 01:41 PM to [place] ([phone]/)  by [phone]. Successful Call: [place] called 01/07/2018 02:04 PM to [place] [phone]/FENG,YAN) by [id number]. Read Back: Yes Comments: CRITICAL RESULT CALLED TO, READ BACK BY AND VERIFIED WITH: Thu Ray,RN     03/02/2017 03:23 PM 3.81 (H) 0.44 - 1.00 mg/dL Final  11/17/2015 05:57 PM 2.23 (H) 0.44 - 1.00 mg/dL Final  10/25/2015 04:00 AM 2.84 (H) 0.44 - 1.00 mg/dL Final  10/24/2015 04:28 AM 3.08 (H) 0.44 - 1.00 mg/dL Final  10/23/2015 03:45 AM 2.89 (H) 0.44 - 1.00 mg/dL Final  10/21/2015 01:11 AM 3.40 (H) 0.44 - 1.00 mg/dL Final  10/20/2015 03:30 AM 4.09 (H) 0.44 - 1.00 mg/dL Final  10/19/2015 03:59 AM 3.93 (H) 0.44 - 1.00 mg/dL Final    Comment:    DELTA CHECK NOTED  10/17/2015 04:00 PM 2.32 (H) 0.44 - 1.00 mg/dL Final  03/09/2014 02:21 PM 1.71 (H) 0.50 - 1.10 mg/dL Final  11/14/2013 04:42 PM 1.65 (H) 0.50 - 1.10 mg/dL Final  11/14/2013 03:53 PM 1.70 (H) 0.50 - 1.10 mg/dL Final  08/16/2013 10:15 AM 1.72 (H) 0.50 - 1.10 mg/dL Final  08/14/2013 04:15 AM 2.24 (H) 0.50 - 1.10 mg/dL Final  08/13/2013 07:10 PM 1.99 (H) 0.50 - 1.10 mg/dL Final  08/10/2013 02:00 PM 1.69 (H) 0.50 - 1.10 mg/dL Final  07/26/2013 12:52 PM 1.70 (H) 0.50 - 1.10 mg/dL Final  04/28/2012 11:32 AM 1.58 (H) 0.50 - 1.10 mg/dL Final  12/13/2009 06:40 AM  1.06 0.4 - 1.2 mg/dL Final  12/10/2009 06:08 AM 0.93 0.4 - 1.2 mg/dL Final  12/09/2009 07:25 AM 1.07 0.4 - 1.2 mg/dL Final  12/08/2009 06:00 AM 1.03 0.4 - 1.2 mg/dL Final  12/07/2009 05:45 AM 1.36 (H) 0.4 - 1.2 mg/dL Final  12/06/2009 06:40 PM 1.50 (H) 0.4 - 1.2 mg/dL Final  09/09/2009 04:27 AM 1.04 0.4 - 1.2 mg/dL Final  09/08/2009 04:03 AM 1.10 0.4 - 1.2 mg/dL Final  09/07/2009 09:15 AM 1.28 (H) 0.4 - 1.2 mg/dL Final     PMHx:   Past Medical History:  Diagnosis Date  . Anemia   . Anxiety   . Arthritis   . CHF (congestive heart failure) ([place])   . CKD (chronic kidney disease)    Stage III 03/2013  . Colon cancer ([place])   . COPD (chronic obstructive pulmonary  disease) ([place])   . Coronary artery disease   . Depression   . Hypertension   . Meningitis   . PE (pulmonary embolism)   . Peripheral vascular disease ([place])   . Shortness of breath   . Sleep apnea   . Vertigo     Past Surgical History:  Procedure Laterality Date  . ABDOMINAL AORTAGRAM N/A 07/26/2013   Procedure: ABDOMINAL Maxcine Ham;  Surgeon: [name] [place], MD;  Location: [facility] CATH LAB;  Service: Cardiovascular;  Laterality: N/A;  . ABDOMINAL HYSTERECTOMY    . CESAREAN SECTION     X 3 [id number]  . CHOLECYSTECTOMY    . CHOLECYSTECTOMY  1980/s  . COLECTOMY  2010  . DIVERTICULOSIS SURGERY-2002  2012  . FEMORAL-POPLITEAL BYPASS GRAFT Left 08/13/2013   Procedure: BYPASS GRAFT FEMORAL-POPLITEAL ARTERY WITH NON-REVERSED SAPHANEOUS VEIN; ULTRASOUND GUIDED;  Surgeon: Mal [name], MD;  Location: [place];  Service: Vascular;  Laterality: Left;  . LOWER EXTREMITY ANGIOGRAM Left 07/26/2013   Procedure: LOWER EXTREMITY ANGIOGRAM;  Surgeon: [name] [place], MD;  Location: [facility] CATH LAB;  Service: Cardiovascular;  Laterality: Left;    Family Hx:  Family History  Problem Relation Age of Onset  . Cancer Mother 39       breast and bone  . Cancer Father 17       prostate  . Hypertension Sister   . Bleeding Disorder Sister   . Cancer Cousin 20       breast cancer   . Hypertension Daughter     Social History:  reports that she has been smoking cigarettes.  She has a 40.00 pack-year smoking history. She uses smokeless tobacco. She reports that she does not drink alcohol or use drugs.  Previously living at home alone.  Daughter has come to stay with her over the last month due to issues  Allergies:  Allergies  Allergen Reactions  . Gadolinium Derivatives Hives and Other (See Comments)    HIVES, Desc: HIVES W/ "DYE" USED FOR 1ST CT SCAN BUT NOT 2ND, NO PREMEDS USED, PT UNCERTAIN OF CIRCUMSTANCES,,?POSSIBLE MRI CONTRAST ALLERGY, ALL STUDIES DONE "SOMEWHERE" IN [place]//A.C., Onset Date: [id number]  .  Iohexol Other (See Comments)     Code: HIVES, Desc: HIVES W/ "DYE" USED FOR 1ST CT SCAN BUT NOT 2ND, NO PREMEDS USED, PT UNCERTAIN OF CIRCUMSTANCES,,?POSSIBLE MRI CONTRAST ALLERGY, ALL STUDIES DONE "SOMEWHERE" IN [place]//A.C., Onset Date: [id number]     Medications: Prior to Admission medications   Medication Sig Start Date End Date Taking? Authorizing Provider  albuterol (PROVENTIL HFA;VENTOLIN HFA) 108 (90 Base) MCG/ACT inhaler Inhale 2 puffs into the lungs every 6 (six) hours  as needed for wheezing or shortness of breath.    Yes [provider]  amLODipine (NORVASC) 10 MG tablet Take 10 mg by mouth daily.   Yes [provider]  Ascorbic Acid (VITAMIN C) 1000 MG tablet Take 1,000 mg by mouth every other day.   Yes [provider]  diphenhydrAMINE (BENADRYL) 25 mg capsule Take 25 mg by mouth daily as needed for allergies.   Yes [provider]  IRON PO Take 1 tablet by mouth daily.    Yes [provider]  labetalol (NORMODYNE) 200 MG tablet Take 200 mg by mouth 2 (two) times daily.   Yes [provider]  mupirocin ointment (BACTROBAN) 2 % Apply 1 application topically daily.   Yes [provider]  NYSTATIN powder Apply to affected area twice daily as needed for infection 05/23/17  Yes [provider]  apixaban (ELIQUIS) 2.5 MG TABS tablet Take 1 tablet (2.5 mg total) by mouth 2 (two) times daily. 07/07/17   Truitt Merle, MD    I have reviewed the patient's current medications.  Labs:  Results for orders placed or performed during the hospital encounter of 04/29/18 (from the past 48 hour(s))  Basic metabolic panel     Status: Abnormal   Collection Time: 04/29/18  1:34 PM  Result Value Ref Range   Sodium 136 135 - 145 mmol/L   Potassium 6.2 (H) 3.5 - 5.1 mmol/L   Chloride 112 (H) 101 - 111 mmol/L   CO2 <7 (L) 22 - 32 mmol/L   Glucose, Bld 79 65 - 99 mg/dL   BUN 155 (H) 6 - 20 mg/dL   Creatinine, Ser 14.82 (H) 0.44 -  1.00 mg/dL   Calcium 7.0 (L) 8.9 - 10.3 mg/dL   GFR calc non Af Amer 2 (L) >60 mL/min   GFR calc Af Amer 3 (L) >60 mL/min    Comment: (NOTE) The eGFR has been calculated using the CKD EPI equation. This calculation has not been validated in all clinical situations. eGFR's persistently <60 mL/min signify possible Chronic Kidney Disease.    Anion gap NOT CALCULATED 5 - 15    Comment: Performed at [facility] [address], [place], [place] [address]  CBC     Status: Abnormal   Collection Time: 04/29/18  1:34 PM  Result Value Ref Range   WBC 16.0 (H) 4.0 - 10.5 K/uL   RBC 2.77 (L) 3.87 - 5.11 MIL/uL   Hemoglobin 7.0 (L) 12.0 - 15.0 g/dL   HCT 21.8 (L) 36.0 - 46.0 %   MCV 78.7 78.0 - 100.0 fL   MCH 25.3 (L) 26.0 - 34.0 pg   MCHC 32.1 30.0 - 36.0 g/dL   RDW 17.5 (H) 11.5 - 15.5 %   Platelets 224 150 - 400 K/uL    Comment: Performed at [facility], [place] [address], [place], [place] [address]  I-stat troponin, ED     Status: None   Collection Time: 04/29/18  1:54 PM  Result Value Ref Range   Troponin i, poc 0.03 0.00 - 0.08 ng/mL   Comment 3            Comment: Due to the release kinetics of cTnI, a negative result within the first hours of the onset of symptoms does not rule out myocardial infarction with certainty. If myocardial infarction is still suspected, repeat the test at appropriate intervals.   POC occult blood, ED     Status: None  Collection Time: 04/29/18  4:46 PM  Result Value Ref Range   Fecal Occult Bld NEGATIVE NEGATIVE  Type and screen [place]     Status: None   Collection Time: 04/29/18  4:50 PM  Result Value Ref Range   ABO/RH(D) B POS    Antibody Screen NEG    Sample Expiration      05/02/2018 Performed at [facility], [place] [address], [place], [place] [address]   Protime-INR     Status: Abnormal   Collection Time: 04/29/18  4:53 PM  Result Value Ref Range   Prothrombin Time 15.9 (H) 11.4 - 15.2 seconds    INR 1.29     Comment: Performed at [facility] [address], [place], [place] [address]     ROS:  Constitutional: positive for fatigue and Feeling cold Respiratory: positive for dyspnea on exertion Gastrointestinal: positive for nausea Musculoskeletal:positive for muscle weakness Positive for itching  Physical Exam: Vitals:   04/29/18 1715 04/29/18 1730  BP:  (!) 129/52  Pulse: 71 67  Resp: 13 10  Temp:    SpO2: 100% 100%     General: Moderately obese black female.  She does not always respond appropriately to questions.  Staring off into space HEENT: Pupils are equally round and reactive to light, extra motions are intact, mucous membranes are moist Neck: Minor JVD Heart: Regular rate and rhythm with a systolic ejection murmur Lungs: Poor effort, anteriorly mostly clear Abdomen: Obese, soft, nontender Extremities: Minimal edema Skin: Evidence of scratching Neuro: Nonfocal but having difficulty participating in conversation  Assessment/Plan: 62 year old black female with multiple medical problems including advanced CKD.  She has now progressed to the point where she is uremic and needs to initiate dialysis 1.Renal- advanced CKD with progressive renal failure.  She is now to the point where she is uremic, hyperkalemic and volume overloaded as well as a significant metabolic acidosis.  She needs to initiate dialysis tonight.  I appreciate CCM assisting me with Vas-Cath placement-we will plan for 2-hour treatment tonight, 3-hour treatment on Thursday to start.  We will inform [facility] patient is an inpatient as she will still need permanent access placement and conversion of this Vas-Cath to tunneled dialysis catheter. 2. Hypertension/volume  -volume and blood pressure do not seem to be a significant issue for her.  Her home medication list includes Norvasc 10 daily and labetalol 200 twice daily.  I would hold the Norvasc as I anticipate blood pressure control will improve with  the initiation of dialysis 3.  Metabolic acidosis-secondary to renal failure.  Should correct with dialysis but given significant abnormality will also start oral bicarb that will be able to be discontinued at the time of discharge 4. Anemia  -anemia likely related to CKD.  She has not been on any ESA as an outpatient.  I will check iron stores and initiate ESA here 5.  Bones-will check phosphorus and intact PTH and act as needed 6.  Dispo-patient previously living at home on her own.  According to the daughter she has been down for quite some time.  It is unclear how quickly she will bounce back   Ramzey Petrovic A 04/29/2018, 6:13 PM

## 2018-04-29 NOTE — ED Provider Notes (Signed)
[facility] EMERGENCY DEPARTMENT Provider Note  CSN: [id number] Arrival date & time: 04/29/18 1237  Chief Complaint(s) Weakness   HPI Whitney Walker is a 62 y.o. female with extensive PMH including HTN, COPD not on O2, grade 1 dHF, DVT/PE on eliquis here for several weeks of gradually worsening DOE, fatigue, decreased activity, decreased oral intake, and AMS.  Patient reports that she can barely get up out of bed and walk a few steps before getting extremely fatigued or short of breath.  This appears to be exacerbated by exertion and activity.  Relieved by rest.  She denies any recent fevers or infections.  She denies any associated chest pain.  Family reports that she has been talking out of her head at times and has had some memory deficits. They deny any recent trauma or falls.  No focal weakness or vision changes.  No nausea or vomiting.  No diarrhea.  No abdominal pain.  No hematochezia, but patient does endorse black stools which she attributes to being on iron.  Patient is still makes urine but is been decreased recently.  No dysuria.  They do report that the patient has been complaining of itchiness throughout her skin and scratching herself a lot.  Patient also endorses a taste of pneumonia in her mouth.   Patient was scheduled to have fistula and PermCath placement to start dialysis.  She has been off of her Eliquis for several weeks in anticipation of the upcoming surgery.  HPI  Past Medical History Past Medical History:  Diagnosis Date  . Anemia   . Anxiety   . Arthritis   . CHF (congestive heart failure) ([place])   . CKD (chronic kidney disease)    Stage III 03/2013  . Colon cancer ([place])   . COPD (chronic obstructive pulmonary disease) ([place])   . Coronary artery disease   . Depression   . Hypertension   . Meningitis   . PE (pulmonary embolism)   . Peripheral vascular disease ([place])   . Shortness of breath   . Sleep apnea   . Vertigo    Patient Active  Problem List   Diagnosis Date Noted  . Dyspnea on exertion 03/12/2017  . Morbid (severe) obesity due to excess calories ([place]) 03/12/2017  . Chest pain 03/02/2017  . Anemia of chronic kidney failure, stage 3 (moderate) ([place]) 12/10/2016  . History of colon cancer 08/12/2016  . Mass of left breast on mammogram 12/10/2015  . MDD (major depressive disorder), recurrent severe, without psychosis ([place]) 10/19/2015  . HTN (hypertension) 10/18/2015  . DVT (deep venous thrombosis) ([place]) 10/18/2015  . Depression 10/18/2015  . Pain, joint, lower leg, left 01/24/2015  . Numbness and tingling of left arm and leg 01/24/2015  . Aftercare following surgery of the circulatory system, [place] 07/19/2014  . PVD (peripheral vascular disease) with claudication ([place]) 03/23/2014  . Swelling of limb-Left leg 03/23/2014  . Weakness-Left arm/Leg 03/23/2014  . PAD (peripheral artery disease) ([place]) 12/14/2013  . CKD (chronic kidney disease) stage 4, GFR 15-29 ml/min (HCC) 11/15/2013  . Chronic diastolic heart failure ([place]) 11/15/2013  . Essential hypertension 11/15/2013  . Pulmonary embolism ([place]) 11/14/2013  . Pain in limb 07/30/2013  . Atherosclerosis of native arteries of the extremities with ulceration(440.23) 07/30/2013  . Cirrhosis ([place]) 05/21/2012  . Leucocytosis 04/28/2012  . Colon cancer ([place]) 04/28/2012   Home Medication(s) Prior to Admission medications   Medication Sig Start Date End Date Taking? Authorizing Provider  albuterol (PROVENTIL HFA;VENTOLIN HFA) 108 (  90 Base) MCG/ACT inhaler Inhale 2 puffs into the lungs every 6 (six) hours as needed for wheezing or shortness of breath.    Yes [provider]  amLODipine (NORVASC) 10 MG tablet Take 10 mg by mouth daily.   Yes [provider]  Ascorbic Acid (VITAMIN C) 1000 MG tablet Take 1,000 mg by mouth every other day.   Yes [provider]  diphenhydrAMINE (BENADRYL) 25 mg capsule Take 25 mg by mouth daily as needed for  allergies.   Yes [provider]  IRON PO Take 1 tablet by mouth daily.    Yes [provider]  labetalol (NORMODYNE) 200 MG tablet Take 200 mg by mouth 2 (two) times daily.   Yes [provider]  mupirocin ointment (BACTROBAN) 2 % Apply 1 application topically daily.   Yes [provider]  NYSTATIN powder Apply to affected area twice daily as needed for infection 05/23/17  Yes [provider]  apixaban (ELIQUIS) 2.5 MG TABS tablet Take 1 tablet (2.5 mg total) by mouth 2 (two) times daily. 07/07/17   Truitt Merle, MD                                                                                                                                    Past Surgical History Past Surgical History:  Procedure Laterality Date  . ABDOMINAL AORTAGRAM N/A 07/26/2013   Procedure: ABDOMINAL Maxcine Ham;  Surgeon: [name] [place]ville, MD;  Location: [facility] CATH LAB;  Service: Cardiovascular;  Laterality: N/A;  . ABDOMINAL HYSTERECTOMY    . CESAREAN SECTION     X 3 [id number]  . CHOLECYSTECTOMY    . CHOLECYSTECTOMY  1980/s  . COLECTOMY  2010  . DIVERTICULOSIS SURGERY-2002  2012  . FEMORAL-POPLITEAL BYPASS GRAFT Left 08/13/2013   Procedure: BYPASS GRAFT FEMORAL-POPLITEAL ARTERY WITH NON-REVERSED SAPHANEOUS VEIN; ULTRASOUND GUIDED;  Surgeon: Mal [name], MD;  Location: [facility];  Service: Vascular;  Laterality: Left;  . LOWER EXTREMITY ANGIOGRAM Left 07/26/2013   Procedure: LOWER EXTREMITY ANGIOGRAM;  Surgeon: [name] Backus, MD;  Location: [facility] CATH LAB;  Service: Cardiovascular;  Laterality: Left;   Family History Family History  Problem Relation Age of Onset  . Cancer Mother 42       breast and bone  . Cancer Father 42       prostate  . Hypertension Sister   . Bleeding Disorder Sister   . Cancer Cousin 20       breast cancer   . Hypertension Daughter     Social History Social History   Tobacco Use  . Smoking status: Current Every Day Smoker    Packs/day: 1.00    Years:  40.00    Pack years: 40.00    Types: Cigarettes  . Smokeless tobacco: Current User  Substance Use Topics  . Alcohol use: No    Alcohol/week: 0.0 oz    Comment: she used to  drink alcohol, quit in 2010   . Drug use: No   Allergies Gadolinium derivatives and Iohexol  Review of Systems Review of Systems All other systems are reviewed and are negative for acute change except as noted in the HPI  Physical Exam Vital Signs  I have reviewed the triage vital signs BP (!) 119/92   Pulse (!) 51   Temp 98.2 F (36.8 C) (Oral)   Resp 13   Ht 5\' 3"  (1.6 m)   Wt 91.2 kg (201 lb)   SpO2 96%   BMI 35.61 kg/m   Physical Exam  Constitutional: She is oriented to person, place, and time. She appears well-developed and well-nourished. No distress.  HENT:  Head: Normocephalic and atraumatic.  Nose: Nose normal.  Eyes: Pupils are equal, round, and reactive to light. Conjunctivae and EOM are normal. Right eye exhibits no discharge. Left eye exhibits no discharge. No scleral icterus.  Neck: Normal range of motion. Neck supple.  Cardiovascular: Normal rate and regular rhythm. Exam reveals no gallop and no friction rub.  No murmur heard. Pulmonary/Chest: Effort normal and breath sounds normal. No stridor. No respiratory distress. She has no rales.  Abdominal: Soft. She exhibits no distension. There is no tenderness.  Musculoskeletal: She exhibits no edema or tenderness.  Neurological: She is alert and oriented to person, place, and time.  Skin: Skin is warm and dry. No rash noted. She is not diaphoretic. No erythema.  Psychiatric: She has a normal mood and affect.  Vitals reviewed.   ED Results and Treatments Labs (all labs ordered are listed, but only abnormal results are displayed) Labs Reviewed  BASIC METABOLIC PANEL - Abnormal; Notable for the following components:      Result Value   Potassium 6.2 (*)    Chloride 112 (*)    CO2 <7 (*)    BUN 155 (*)    Creatinine, Ser 14.82 (*)      Calcium 7.0 (*)    GFR calc non Af Amer 2 (*)    GFR calc Af Amer 3 (*)    All other components within normal limits  CBC - Abnormal; Notable for the following components:   WBC 16.0 (*)    RBC 2.77 (*)    Hemoglobin 7.0 (*)    HCT 21.8 (*)    MCH 25.3 (*)    RDW 17.5 (*)    All other components within normal limits  I-STAT TROPONIN, ED                                                                                                                         EKG  EKG Interpretation  Date/Time:  Wednesday Apr 29 2018 13:38:08 EDT Ventricular Rate:  70 PR Interval:  216 QRS Duration: 80 QT Interval:  438 QTC Calculation: 473 R Axis:   -28 Text Interpretation:  Sinus rhythm with 1st degree A-V block Cannot rule out Anterior infarct , age undetermined Abnormal ECG No significant change  since last tracing Confirmed by Addison Lank [phone]) on 04/29/2018 4:15:36 PM      Radiology Dg Chest 2 View  Result Date: 04/29/2018 CLINICAL DATA:  Two weeks of shortness of breath. History of CHF, COPD, chronic renal insufficiency stage IV, colonic malignancy, peripheral vascular disease, current smoker. EXAM: CHEST - 2 VIEW COMPARISON:  PA and lateral chest x-ray of March 02, 2017 FINDINGS: The lungs are mildly hyperinflated. The cardiac silhouette is enlarged. The pulmonary vascularity is mildly engorged. The interstitial markings are increased and more conspicuous than on the previous study. There is no pleural effusion. The bony thorax exhibits no acute abnormality. IMPRESSION: COPD.  Superimposed low-grade CHF.  No alveolar pneumonia. Electronically Signed   By: David  Martinique M.D.   On: 04/29/2018 14:08   Pertinent labs & imaging results that were available during my care of the patient were reviewed by me and considered in my medical decision making (see chart for details).  Medications Ordered in ED Medications - No data to display                                                                                                                                   Procedures Procedures CRITICAL CARE Performed by: Grayce Sessions Jerricka Carvey Total critical care time: 35 minutes Critical care time was exclusive of separately billable procedures and treating other patients. Critical care was necessary to treat or prevent imminent or life-threatening deterioration. Critical care was time spent personally by me on the following activities: development of treatment plan with patient and/or surrogate as well as nursing, discussions with consultants, evaluation of patient's response to treatment, examination of patient, obtaining history from patient or surrogate, ordering and performing treatments and interventions, ordering and review of laboratory studies, ordering and review of radiographic studies, pulse oximetry and re-evaluation of patient's condition.   (including critical care time)  Medical Decision Making / ED Course I have reviewed the nursing notes for this encounter and the patient's prior records (if available in EHR or on provided paperwork).     Patient is afebrile with stable vital signs.  She is well-appearing.  Patient is oriented x4 but is slow to respond.  Given the taste of ammonia and history of CKD, have concern for worsening of her renal failure.   Screening labs obtained and confirmed acute on chronic renal failure with hyperkalemia at 6.2 without EKG changes.  We will temporize with Kayexalate.  However given elevated BUN with altered mental status, patient will likely require dialysis.  Chest x-ray obtained which showed mild pulmonary vascular congestion without overt edema.  Patient is not hypoxic on room air.  Patient also noted to have leukocytosis but denies any recent infectious symptoms.  Will obtain a UA to assess for possible urinary tract infection.  Hemoglobin also noted to be low.  With dark stools for Hemoccult was obtained which was negative.    Case discussed  with  nephrology who will evaluate the patient for likely dialysis.  Will admit the patient to hospitalist service for continued work-up and management.      Final Clinical Impression(s) / ED Diagnoses Final diagnoses:  Acute renal failure superimposed on stage 5 chronic kidney disease, not on chronic dialysis, unspecified acute renal failure type (HCC)  Hyperkalemia  Anemia, unspecified type      This chart was dictated using voice recognition software.  Despite best efforts to proofread,  errors can occur which can change the documentation meaning.   Fatima Blank, MD 04/29/18 2303

## 2018-04-30 ENCOUNTER — Inpatient Hospital Stay (HOSPITAL_COMMUNITY): Payer: Medicare Other | Admitting: Anesthesiology

## 2018-04-30 ENCOUNTER — Encounter (HOSPITAL_COMMUNITY): Payer: Self-pay | Admitting: Certified Registered Nurse Anesthetist

## 2018-04-30 ENCOUNTER — Inpatient Hospital Stay (HOSPITAL_COMMUNITY): Payer: Medicare Other

## 2018-04-30 ENCOUNTER — Encounter (HOSPITAL_COMMUNITY)
Admission: EM | Disposition: A | Discharge status: Home / Self Care | Payer: Self-pay | Source: Home / Self Care | Attending: Internal Medicine

## 2018-04-30 ENCOUNTER — Other Ambulatory Visit: Payer: Self-pay

## 2018-04-30 DIAGNOSIS — N186 End stage renal disease: Secondary | ICD-10-CM

## 2018-04-30 DIAGNOSIS — Z992 Dependence on renal dialysis: Secondary | ICD-10-CM

## 2018-04-30 DIAGNOSIS — G4733 Obstructive sleep apnea (adult) (pediatric): Secondary | ICD-10-CM

## 2018-04-30 DIAGNOSIS — G9341 Metabolic encephalopathy: Secondary | ICD-10-CM

## 2018-04-30 DIAGNOSIS — C189 Malignant neoplasm of colon, unspecified: Secondary | ICD-10-CM

## 2018-04-30 DIAGNOSIS — E872 Acidosis: Secondary | ICD-10-CM

## 2018-04-30 DIAGNOSIS — I82509 Chronic embolism and thrombosis of unspecified deep veins of unspecified lower extremity: Secondary | ICD-10-CM

## 2018-04-30 DIAGNOSIS — R110 Nausea: Secondary | ICD-10-CM

## 2018-04-30 HISTORY — PX: INSERTION OF DIALYSIS CATHETER: SHX1324

## 2018-04-30 LAB — CBC
HCT: 19.6 % — ABNORMAL LOW (ref 36.0–46.0)
Hemoglobin: 6.7 g/dL — CL (ref 12.0–15.0)
MCH: 25.9 pg — ABNORMAL LOW (ref 26.0–34.0)
MCHC: 34.2 g/dL (ref 30.0–36.0)
MCV: 75.7 fL — ABNORMAL LOW (ref 78.0–100.0)
Platelets: 212 10*3/uL (ref 150–400)
RBC: 2.59 MIL/uL — ABNORMAL LOW (ref 3.87–5.11)
RDW: 16.6 % — ABNORMAL HIGH (ref 11.5–15.5)
WBC: 18.7 10*3/uL — ABNORMAL HIGH (ref 4.0–10.5)

## 2018-04-30 LAB — BASIC METABOLIC PANEL
Anion gap: 20 — ABNORMAL HIGH (ref 5–15)
BUN: 96 mg/dL — ABNORMAL HIGH (ref 6–20)
CO2: 11 mmol/L — ABNORMAL LOW (ref 22–32)
Calcium: 7.5 mg/dL — ABNORMAL LOW (ref 8.9–10.3)
Chloride: 109 mmol/L (ref 101–111)
Creatinine, Ser: 10.01 mg/dL — ABNORMAL HIGH (ref 0.44–1.00)
GFR calc Af Amer: 4 mL/min — ABNORMAL LOW (ref >60)
GFR calc non Af Amer: 4 mL/min — ABNORMAL LOW (ref >60)
Glucose, Bld: 85 mg/dL (ref 65–99)
Potassium: 3 mmol/L — ABNORMAL LOW (ref 3.5–5.1)
Sodium: 140 mmol/L (ref 135–145)

## 2018-04-30 LAB — RENAL FUNCTION PANEL
Albumin: 2.8 g/dL — ABNORMAL LOW (ref 3.5–5.0)
Anion gap: 19 — ABNORMAL HIGH (ref 5–15)
BUN: 98 mg/dL — ABNORMAL HIGH (ref 6–20)
CO2: 12 mmol/L — ABNORMAL LOW (ref 22–32)
Calcium: 7.5 mg/dL — ABNORMAL LOW (ref 8.9–10.3)
Chloride: 109 mmol/L (ref 101–111)
Creatinine, Ser: 10.02 mg/dL — ABNORMAL HIGH (ref 0.44–1.00)
GFR calc Af Amer: 4 mL/min — ABNORMAL LOW (ref >60)
GFR calc non Af Amer: 4 mL/min — ABNORMAL LOW (ref >60)
Glucose, Bld: 82 mg/dL (ref 65–99)
Phosphorus: 7.1 mg/dL — ABNORMAL HIGH (ref 2.5–4.6)
Potassium: 3 mmol/L — ABNORMAL LOW (ref 3.5–5.1)
Sodium: 140 mmol/L (ref 135–145)

## 2018-04-30 LAB — IRON AND TIBC
Iron: 147 ug/dL (ref 28–170)
Saturation Ratios: 97 % — ABNORMAL HIGH (ref 10.4–31.8)
TIBC: 151 ug/dL — ABNORMAL LOW (ref 250–450)
UIBC: 4 ug/dL

## 2018-04-30 LAB — HIV ANTIBODY (ROUTINE TESTING W REFLEX): HIV Screen 4th Generation wRfx: NONREACTIVE

## 2018-04-30 LAB — GLUCOSE, CAPILLARY: Glucose-Capillary: 124 mg/dL — ABNORMAL HIGH (ref 65–99)

## 2018-04-30 LAB — PREPARE RBC (CROSSMATCH)

## 2018-04-30 IMAGING — DX DG CHEST 1V PORT
1 series · 1 of 1 positions shown · non-contrast
Comparison: [DATE].

CLINICAL DATA: Dialysis cath insertion.

EXAM:
PORTABLE CHEST 1 VIEW

[chest ap]
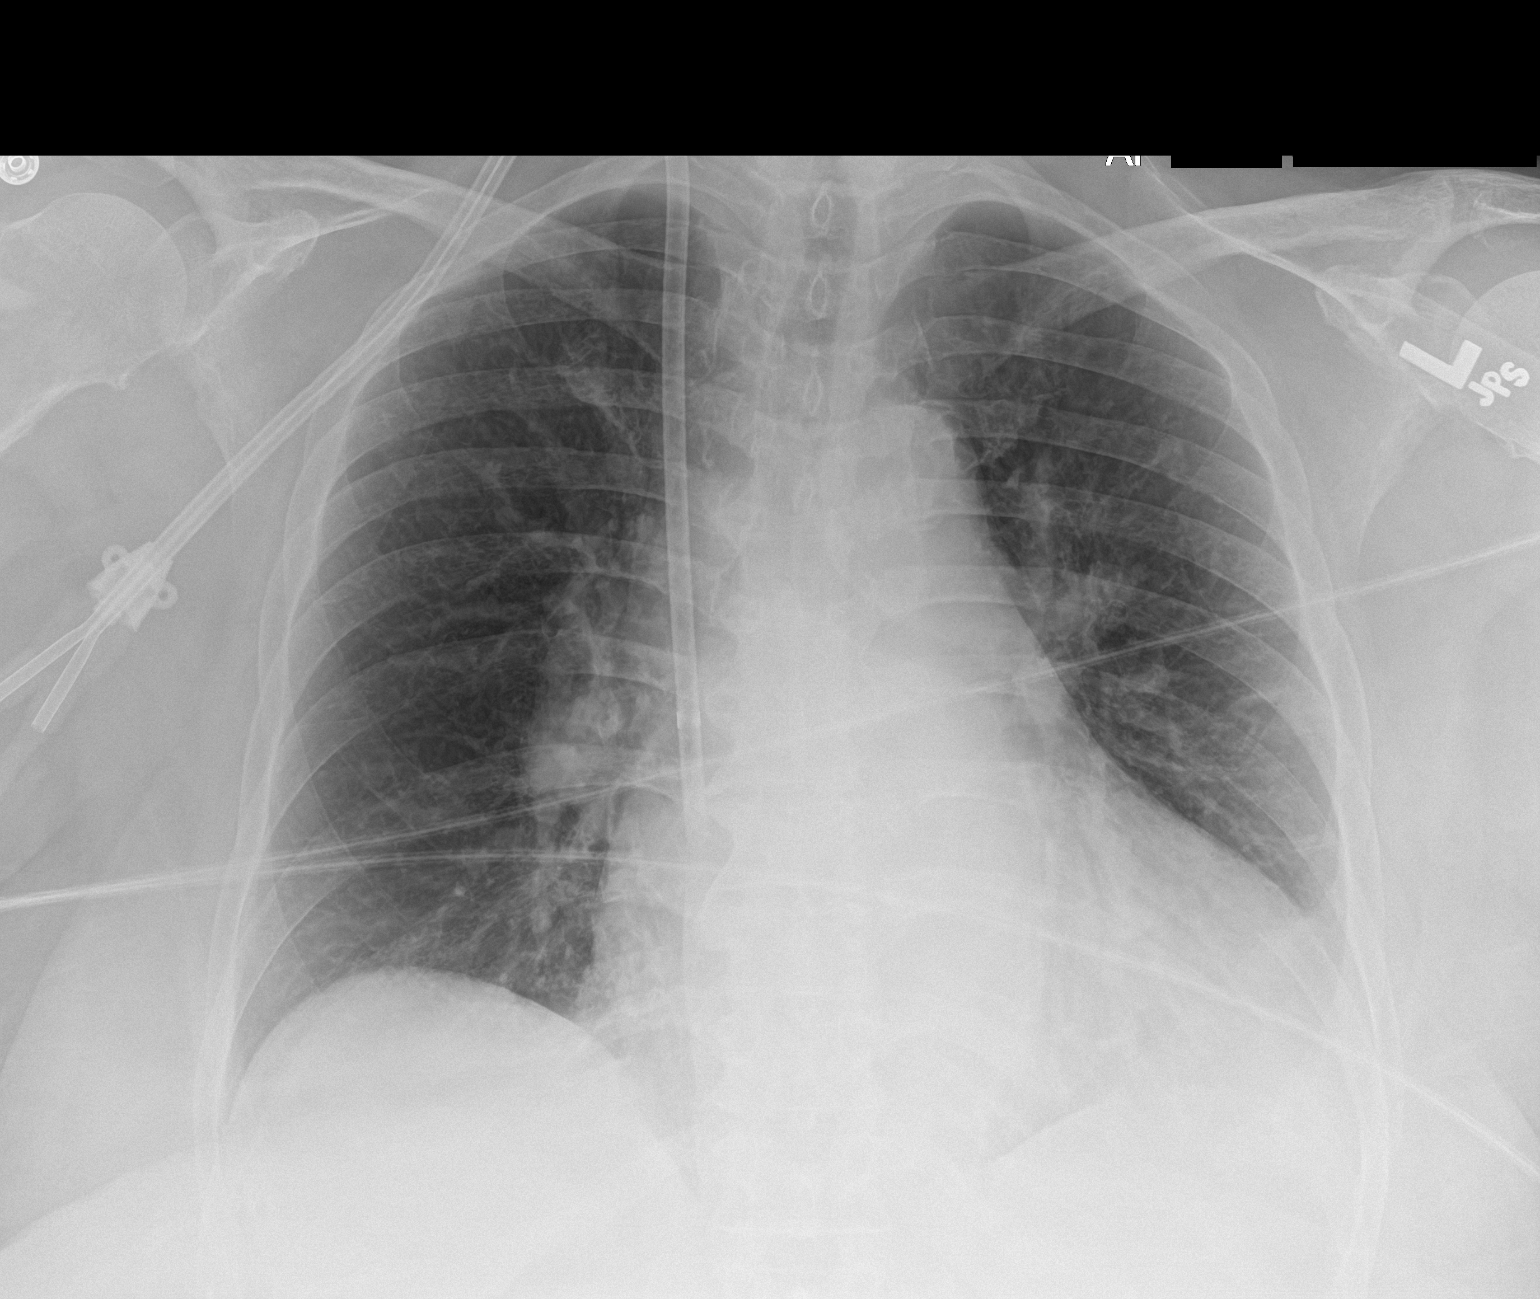

[1 of 1 positions shown; findings below may reference images not displayed]

FINDINGS: Dialysis catheter noted with its tip over the upper right atrium.
Cardiomegaly with normal pulmonary vascularity. Mild left base
atelectasis/infiltrate. Small left pleural effusion. No acute bony
abnormality.
IMPRESSION: Phthisis catheter with its tip over the upper right atrium. No
pneumothorax.

2.  Cardiomegaly.  No pulmonary venous congestion.

3. Mild left base atelectasis/infiltrate with small left pleural
effusion.

## 2018-04-30 SURGERY — INSERTION OF DIALYSIS CATHETER
Anesthesia: Monitor Anesthesia Care | Site: Neck | Laterality: Right

## 2018-04-30 MED ORDER — ONDANSETRON HCL 4 MG/2ML IJ SOLN
INTRAMUSCULAR | Status: AC
  Filled 2018-04-30: qty 2

## 2018-04-30 MED ORDER — HYDROMORPHONE HCL 2 MG/ML IJ SOLN: 0.3000 mg | INTRAMUSCULAR | Status: DC | PRN

## 2018-04-30 MED ORDER — MIDAZOLAM HCL 2 MG/2ML IJ SOLN
INTRAMUSCULAR | Status: DC | PRN
  Administered 2018-04-30 (×2): 1 mg via INTRAVENOUS

## 2018-04-30 MED ORDER — CEFAZOLIN SODIUM-DEXTROSE 2-4 GM/100ML-% IV SOLN
INTRAVENOUS | Status: AC
  Filled 2018-04-30: qty 100

## 2018-04-30 MED ORDER — LIDOCAINE-EPINEPHRINE (PF) 1 %-1:200000 IJ SOLN
INTRAMUSCULAR | Status: AC
  Filled 2018-04-30: qty 30

## 2018-04-30 MED ORDER — HEPARIN (PORCINE) IN NACL 100-0.45 UNIT/ML-% IJ SOLN: 1000.0000 [IU]/h | INTRAMUSCULAR | Status: DC

## 2018-04-30 MED ORDER — HEPARIN (PORCINE) IN NACL 100-0.45 UNIT/ML-% IJ SOLN
1200.0000 [IU]/h | INTRAMUSCULAR | Status: DC
  Administered 2018-04-30 – 2018-05-01 (×2): 1000 [IU]/h via INTRAVENOUS
  Administered 2018-05-04: 1150 [IU]/h via INTRAVENOUS
  Administered 2018-05-05: 1350 [IU]/h via INTRAVENOUS
  Filled 2018-04-30 (×5): qty 250

## 2018-04-30 MED ORDER — 0.9 % SODIUM CHLORIDE (POUR BTL) OPTIME
TOPICAL | Status: DC | PRN
  Administered 2018-04-30: 1000 mL

## 2018-04-30 MED ORDER — FENTANYL CITRATE (PF) 100 MCG/2ML IJ SOLN
INTRAMUSCULAR | Status: DC | PRN
  Administered 2018-04-30: 50 ug via INTRAVENOUS

## 2018-04-30 MED ORDER — DARBEPOETIN ALFA 200 MCG/0.4ML IJ SOSY
PREFILLED_SYRINGE | INTRAMUSCULAR | Status: AC
  Administered 2018-04-30: 200 ug via INTRAVENOUS
  Filled 2018-04-30: qty 0.4

## 2018-04-30 MED ORDER — FENTANYL CITRATE (PF) 250 MCG/5ML IJ SOLN
INTRAMUSCULAR | Status: AC
  Filled 2018-04-30: qty 5

## 2018-04-30 MED ORDER — SODIUM CHLORIDE 0.9 % IV SOLN
INTRAVENOUS | Status: DC | PRN
  Administered 2018-04-30: 12:00:00

## 2018-04-30 MED ORDER — MIDAZOLAM HCL 2 MG/2ML IJ SOLN
INTRAMUSCULAR | Status: AC
  Filled 2018-04-30: qty 2

## 2018-04-30 MED ORDER — DEXAMETHASONE SODIUM PHOSPHATE 10 MG/ML IJ SOLN
INTRAMUSCULAR | Status: DC | PRN
  Administered 2018-04-30: 5 mg via INTRAVENOUS

## 2018-04-30 MED ORDER — HEPARIN SODIUM (PORCINE) 1000 UNIT/ML IJ SOLN
INTRAMUSCULAR | Status: AC
  Filled 2018-04-30: qty 1

## 2018-04-30 MED ORDER — FERROUS SULFATE 325 (65 FE) MG PO TABS
325.0000 mg | ORAL_TABLET | Freq: Every day | ORAL | Status: DC
  Administered 2018-05-01: 325 mg via ORAL
  Filled 2018-04-30: qty 1

## 2018-04-30 MED ORDER — LIDOCAINE-EPINEPHRINE (PF) 1 %-1:200000 IJ SOLN
INTRAMUSCULAR | Status: DC | PRN
  Administered 2018-04-30: 20 mL

## 2018-04-30 MED ORDER — SODIUM CHLORIDE 0.9 % IV SOLN: Freq: Once | INTRAVENOUS | Status: DC

## 2018-04-30 MED ORDER — PHENYLEPHRINE 40 MCG/ML (10ML) SYRINGE FOR IV PUSH (FOR BLOOD PRESSURE SUPPORT)
PREFILLED_SYRINGE | INTRAVENOUS | Status: DC | PRN
  Administered 2018-04-30 (×3): 80 ug via INTRAVENOUS

## 2018-04-30 MED ORDER — CEFAZOLIN SODIUM-DEXTROSE 2-3 GM-%(50ML) IV SOLR
INTRAVENOUS | Status: DC | PRN
  Administered 2018-04-30: 2 g via INTRAVENOUS

## 2018-04-30 MED ORDER — DEXAMETHASONE SODIUM PHOSPHATE 10 MG/ML IJ SOLN
INTRAMUSCULAR | Status: AC
  Filled 2018-04-30: qty 1

## 2018-04-30 MED ORDER — HEPARIN SODIUM (PORCINE) 1000 UNIT/ML IJ SOLN
INTRAMUSCULAR | Status: DC | PRN
  Administered 2018-04-30: 306.34 [IU] via INTRAVENOUS

## 2018-04-30 MED ORDER — SODIUM CHLORIDE 0.9 % IV SOLN
INTRAVENOUS | Status: DC
  Administered 2018-04-30: 11:00:00 via INTRAVENOUS

## 2018-04-30 MED ORDER — PROMETHAZINE HCL 25 MG/ML IJ SOLN
12.5000 mg | Freq: Once | INTRAMUSCULAR | Status: AC
  Administered 2018-04-30: 12.5 mg via INTRAVENOUS
  Filled 2018-04-30: qty 1

## 2018-04-30 MED ORDER — ONDANSETRON HCL 4 MG/2ML IJ SOLN: 4.0000 mg | Freq: Once | INTRAMUSCULAR | Status: DC | PRN

## 2018-04-30 MED ORDER — SODIUM CHLORIDE 0.9 % IV SOLN
INTRAVENOUS | Status: AC
  Filled 2018-04-30: qty 1.2

## 2018-04-30 MED ORDER — ONDANSETRON HCL 4 MG/2ML IJ SOLN
INTRAMUSCULAR | Status: DC | PRN
Start: 2018-04-30 — End: 2018-04-30
  Administered 2018-04-30: 4 mg via INTRAVENOUS

## 2018-04-30 MED ORDER — MEPERIDINE HCL 50 MG/ML IJ SOLN: 6.2500 mg | INTRAMUSCULAR | Status: DC | PRN

## 2018-04-30 MED ORDER — HEPARIN SODIUM (PORCINE) 1000 UNIT/ML DIALYSIS
20.0000 [IU]/kg | INTRAMUSCULAR | Status: DC | PRN
  Filled 2018-04-30: qty 2

## 2018-04-30 MED ORDER — PROPOFOL 500 MG/50ML IV EMUL
INTRAVENOUS | Status: DC | PRN
  Administered 2018-04-30: 25 ug/kg/min via INTRAVENOUS

## 2018-04-30 SURGICAL SUPPLY — 39 items
BAG DECANTER FOR FLEXI CONT (MISCELLANEOUS) ×2 IMPLANT
BIOPATCH RED 1 DISK 7.0 (GAUZE/BANDAGES/DRESSINGS) ×2 IMPLANT
CATH PALINDROME RT-P 15FX19CM (CATHETERS) IMPLANT
CATH PALINDROME RT-P 15FX23CM (CATHETERS) ×2 IMPLANT
CATH PALINDROME RT-P 15FX28CM (CATHETERS) IMPLANT
CATH PALINDROME RT-P 15FX55CM (CATHETERS) IMPLANT
COVER PROBE W GEL 5X96 (DRAPES) ×2 IMPLANT
COVER SURGICAL LIGHT HANDLE (MISCELLANEOUS) ×2 IMPLANT
DERMABOND ADVANCED (GAUZE/BANDAGES/DRESSINGS) ×1
DERMABOND ADVANCED .7 DNX12 (GAUZE/BANDAGES/DRESSINGS) ×1 IMPLANT
DRAPE C-ARM 42X72 X-RAY (DRAPES) ×2 IMPLANT
DRAPE CHEST BREAST 15X10 FENES (DRAPES) ×2 IMPLANT
DRSG COVADERM 4X6 (GAUZE/BANDAGES/DRESSINGS) ×2 IMPLANT
GAUZE SPONGE 4X4 12PLY STRL LF (GAUZE/BANDAGES/DRESSINGS) ×2 IMPLANT
GAUZE SPONGE 4X4 16PLY XRAY LF (GAUZE/BANDAGES/DRESSINGS) ×2 IMPLANT
GLOVE BIO SURGEON STRL SZ7.5 (GLOVE) ×2 IMPLANT
GOWN STRL REUS W/ TWL LRG LVL3 (GOWN DISPOSABLE) ×1 IMPLANT
GOWN STRL REUS W/ TWL XL LVL3 (GOWN DISPOSABLE) ×1 IMPLANT
GOWN STRL REUS W/TWL LRG LVL3 (GOWN DISPOSABLE) ×1
GOWN STRL REUS W/TWL XL LVL3 (GOWN DISPOSABLE) ×1
KIT BASIN OR (CUSTOM PROCEDURE TRAY) ×2 IMPLANT
KIT TURNOVER KIT B (KITS) ×2 IMPLANT
NEEDLE 18GX1X1/2 (RX/OR ONLY) (NEEDLE) ×2 IMPLANT
NEEDLE HYPO 25GX1X1/2 BEV (NEEDLE) ×2 IMPLANT
NS IRRIG 1000ML POUR BTL (IV SOLUTION) ×2 IMPLANT
PACK SURGICAL SETUP 50X90 (CUSTOM PROCEDURE TRAY) ×2 IMPLANT
PAD ARMBOARD 7.5X6 YLW CONV (MISCELLANEOUS) ×4 IMPLANT
SOAP 2 % CHG 4 OZ (WOUND CARE) ×2 IMPLANT
SUT ETHILON 3 0 PS 1 (SUTURE) ×2 IMPLANT
SUT MNCRL AB 4-0 PS2 18 (SUTURE) ×2 IMPLANT
SYR 10ML LL (SYRINGE) ×2 IMPLANT
SYR 20CC LL (SYRINGE) ×4 IMPLANT
SYR 5ML LL (SYRINGE) ×2 IMPLANT
SYR CONTROL 10ML LL (SYRINGE) ×2 IMPLANT
TOWEL GREEN STERILE (TOWEL DISPOSABLE) ×2 IMPLANT
TOWEL GREEN STERILE FF (TOWEL DISPOSABLE) ×2 IMPLANT
TOWEL OR 17X26 4PK STRL BLUE (TOWEL DISPOSABLE) ×2 IMPLANT
WATER STERILE IRR 1000ML POUR (IV SOLUTION) ×2 IMPLANT
WIRE BENTSON .035X145CM (WIRE) ×2 IMPLANT

## 2018-04-30 NOTE — Progress Notes (Signed)
CRITICAL VALUE ALERT  Critical Value:  Hgb 6.7  Date & Time Notied:  04/30/18 & 7416  Provider Notified: Dr. Harvest Forest / Dr. Moshe Cipro  Orders Received/Actions taken: Orders recieved

## 2018-04-30 NOTE — Plan of Care (Signed)
  Problem: Clinical Measurements: Goal: Ability to maintain clinical measurements within normal limits will improve Outcome: Progressing Goal: Will remain free from infection Outcome: Progressing Goal: Diagnostic test results will improve Outcome: Progressing Goal: Respiratory complications will improve Outcome: Progressing Goal: Cardiovascular complication will be avoided Outcome: Progressing   Problem: Education: Goal: Knowledge of General Education information will improve Outcome: Progressing

## 2018-04-30 NOTE — Progress Notes (Signed)
HD tx completed w/ bp issues x2 resolved by NS boluses, however, UF was off most of tx, UF goal not met, ended up +110m, VSS, pt looks and feels much better after tx ended, [place]

## 2018-04-30 NOTE — Progress Notes (Signed)
Back from the [place]. Very sleepy wakes up easily. Family at bedside. N/C or any needs at this time. V/S stable and charted. Pola Corn, RN

## 2018-04-30 NOTE — Progress Notes (Signed)
HD tx completed w/o problem, UF goal met, blood rinsed back, VSS, pt received 2 units PRBC w/o s/s of reaction, report called to [employer], Therapist, sports

## 2018-04-30 NOTE — Care Management Note (Signed)
Case Management Note Marvetta Gibbons RN, BSN Unit 4E-Case Manager [phone]  Patient Details  Name: Whitney Walker MRN: [id number] Date of Birth: June 07, 1956  Subjective/Objective:  Patient admitted with ARF need for urgent HD- new- temp cath placed- will need perm. Access                  Action/Plan: PTA Pt lived at home with family- referral for Lovenox- pt still on heparin- do not know dose at this time- (pt has both Medicare/Medicaid) - May need to be Clipped prior to discharge- CM to follow for transition of care needs  Expected Discharge Date:                  Expected Discharge Plan:  Home/Self Care  In-House Referral:     Discharge planning Services  CM Consult  Post Acute Care Choice:    Choice offered to:     DME Arranged:    DME Agency:     HH Arranged:    HH Agency:     Status of Service:  In process, will continue to follow  If discussed at Long Length of Stay Meetings, dates discussed:    Discharge Disposition:   Additional Comments:  Dawayne Patricia, RN 04/30/2018, 3:12 PM

## 2018-04-30 NOTE — Progress Notes (Signed)
HD tx initiated via HD cath w/o problem, pull/push/flush equally w/o problem, VSS w/ soft bp, will cont to monitor while on HD tx

## 2018-04-30 NOTE — Progress Notes (Signed)
ANTICOAGULATION CONSULT NOTE - Initial Consult  Pharmacy Consult for heparin Indication: history of PE  Allergies  Allergen Reactions  . Gadolinium Derivatives Hives and Other (See Comments)    HIVES, Desc: HIVES W/ "DYE" USED FOR 1ST CT SCAN BUT NOT 2ND, NO PREMEDS USED, PT UNCERTAIN OF CIRCUMSTANCES,,?POSSIBLE MRI CONTRAST ALLERGY, ALL STUDIES DONE "SOMEWHERE" IN [place]//A.C., Onset Date: [id number]  . Iohexol Other (See Comments)     Code: HIVES, Desc: HIVES W/ "DYE" USED FOR 1ST CT SCAN BUT NOT 2ND, NO PREMEDS USED, PT UNCERTAIN OF CIRCUMSTANCES,,?POSSIBLE MRI CONTRAST ALLERGY, ALL STUDIES DONE "SOMEWHERE" IN [place]//A.C., Onset Date: [id number]     Patient Measurements: Height: 5' 2.99" (160 cm) Weight: 198 lb 10.2 oz (90.1 kg) IBW/kg (Calculated) : 52.38 Heparin Dosing Weight: 73  Vital Signs: Temp: 98.5 F (36.9 C) (05/02 1246) Temp Source: Oral (05/02 0840) BP: 118/99 (05/02 1231) Pulse Rate: 84 (05/02 1231)  Labs: Recent Labs    04/29/18 1334 04/29/18 1653 04/30/18 0015  HGB 7.0*  --  6.7*  HCT 21.8*  --  19.6*  PLT 224  --  212  LABPROT  --  15.9*  --   INR  --  1.29  --   CREATININE 14.82*  --  10.01*  10.02*    Estimated Creatinine Clearance: 6.2 mL/min (A) (by C-G formula based on SCr of 10.02 mg/dL (H)).   Medical History: Past Medical History:  Diagnosis Date  . Anemia   . Anxiety   . Arthritis   . CHF (congestive heart failure) ([place])   . CKD (chronic kidney disease)    Stage III 03/2013  . Colon cancer ([place])   . COPD (chronic obstructive pulmonary disease) ([place])   . Coronary artery disease   . Depression   . Hypertension   . Meningitis   . PE (pulmonary embolism)   . Peripheral vascular disease ([place])   . Shortness of breath   . Sleep apnea   . Vertigo     Assessment: 62 year old female s/p HD access with history of PE. Patient historically on apixaban but plans were to possibly change to warfarin once HD access was completed.    Last dose of apixaban 4/19 (was hold hold for access pta).  Orders to start heparin. Will not bolus given recent [facility] placement today.   Goal of Therapy:  Heparin level 0.3-0.7 units/ml Monitor platelets by anticoagulation protocol: Yes   Plan:  Start heparin infusion at 1000 units/hr Check anti-Xa level in 8 hours and daily while on heparin Continue to monitor H&H and platelets  Erin Hearing PharmD., BCPS Clinical Pharmacist 04/30/2018 3:33 PM

## 2018-04-30 NOTE — Anesthesia Postprocedure Evaluation (Signed)
Anesthesia Post Note  Patient: Whitney Walker  Procedure(s) Performed: INSERTION OF Right Internal Jugular DIALYSIS CATHETER (Right Neck)     Patient location during evaluation: PACU Anesthesia Type: MAC Level of consciousness: awake and alert Pain management: pain level controlled Vital Signs Assessment: post-procedure vital signs reviewed and stable Respiratory status: spontaneous breathing, nonlabored ventilation, respiratory function stable and patient connected to nasal cannula oxygen Cardiovascular status: stable and blood pressure returned to baseline Postop Assessment: no apparent nausea or vomiting Anesthetic complications: no    Last Vitals:  Vitals:   04/30/18 1231 04/30/18 1246  BP: (!) 118/99   Pulse: 84   Resp: (!) 25   Temp:  36.9 C  SpO2: 97% 97%    Last Pain:  Vitals:   04/30/18 1246  TempSrc:   PainSc: Asleep                 [name] DAVID

## 2018-04-30 NOTE — Progress Notes (Signed)
HD tx initiated via HD cath w/o problem, pull/push/flush equally w/o problem, VSS, will cont to monitor while on HD tx 

## 2018-04-30 NOTE — Progress Notes (Addendum)
[facility] Progress Note    Assessment/ Plan:   62 yo female with CKD V, morbid obesity, hypertension, congestive heart failure, CAD, COPD, reported history of colon cancer and depression. She has been followed by Dr. [name] for advanced CKD without uremia, last seen on 04/14/18, with plans for outpatient fistula placement on 05/01/18. Now admitted with confusion and weakness from uremia due to worsening kidney function requiring urgent dialysis.   1. CKD with progression to ESRD and uremia. Vas-cath placed 5/1 and dialyzed urgently last night, changed to [facility] today. BUN improved 155>96.  Hyperkalemia resolved and now K 3.0. VVS notified by primary team to address perm access. PTH pending  2. HTN. Holding home amlodipine and labetalol as BP improving with HD. 3. Metabolic acidosis secondary to renal failure. CO2 11 and anion gap 20. Continue po bicarb 650mg  2 tab BID. Should also correct with HD.  4. Anemia due to CKD and iron deficiency. Hgb 6.7 Per chart review may have been on po iron as outpatient. Started on aranesp 220mcg weekly.  5. Elevated phosphorous 7.1, consider start phosphate binder  Subjective:   Back from [facility] placement. Somewhat sleepy.    Objective:   BP (!) 119/48 (BP Location: Right Arm)   Pulse 88   Temp 98.2 F (36.8 C) (Oral)   Resp 17   Ht 5\' 3"  (1.6 m)   Wt 198 lb 10.2 oz (90.1 kg)   SpO2 98%   BMI 35.19 kg/m   Intake/Output Summary (Last 24 hours) at 04/30/2018 0926 Last data filed at 04/29/2018 2330 Gross per 24 hour  Intake -  Output -173 ml  Net 173 ml   Weight change:   Physical Exam: [id number] in bed comfortably, sleepy but easily arousable to voice CVS: regular, S1 and S2 normal, grade II systolic murmur Resp: some coarse breath sounds with good air movement bilaterally. Normal effort on room air Abd: obese, soft, nontender, nondistended, no hepatomegaly Ext: trace edema   Imaging: Dg Chest 2 View  Result Date: 04/29/2018 CLINICAL  DATA:  Two weeks of shortness of breath. History of CHF, COPD, chronic renal insufficiency stage IV, colonic malignancy, peripheral vascular disease, current smoker. EXAM: CHEST - 2 VIEW COMPARISON:  PA and lateral chest x-ray of March 02, 2017 FINDINGS: The lungs are mildly hyperinflated. The cardiac silhouette is enlarged. The pulmonary vascularity is mildly engorged. The interstitial markings are increased and more conspicuous than on the previous study. There is no pleural effusion. The bony thorax exhibits no acute abnormality. IMPRESSION: COPD.  Superimposed low-grade CHF.  No alveolar pneumonia. Electronically Signed   By: David  Martinique M.D.   On: 04/29/2018 14:08   Dg Chest Portable 1 View  Result Date: 04/29/2018 CLINICAL DATA:  Right-sided hemodialysis catheter placement. EXAM: PORTABLE CHEST 1 VIEW COMPARISON:  04/29/2018 FINDINGS: 1843 hours. Right IJ central catheter tip overlies the mid SVC. No right pneumothorax. No evidence for right pleural effusion. The cardio pericardial silhouette is enlarged. Interstitial markings are diffusely coarsened with chronic features. The visualized bony structures of the thorax are intact. Telemetry leads overlie the chest. IMPRESSION: Right IJ central line tip overlies the mid SVC. No pneumothorax or pleural effusion. Electronically Signed   By: [name] Stanley M.D.   On: 04/29/2018 19:05    Labs: BMET Recent Labs  Lab 04/29/18 1334 04/30/18 0015  NA 136 140  140  K 6.2* 3.0*  3.0*  CL 112* 109  109  CO2 <7* 11*  12*  GLUCOSE  79 85  82  BUN 155* 96*  98*  CREATININE 14.82* 10.01*  10.02*  CALCIUM 7.0* 7.5*  7.5*  PHOS  --  7.1*   CBC Recent Labs  Lab 04/29/18 1334 04/30/18 0015  WBC 16.0* 18.7*  HGB 7.0* 6.7*  HCT 21.8* 19.6*  MCV 78.7 75.7*  PLT 224 212    Medications:    . darbepoetin (ARANESP) injection - DIALYSIS  200 mcg Intravenous Q Thu-HD  . fentaNYL (SUBLIMAZE) injection  12.5 mcg Intravenous Once  . heparin  5,000  Units Subcutaneous Q8H  . sodium bicarbonate  1,300 mg Oral BID  . vitamin C  1,000 mg Oral QODAY      Bufford Lope, DO PGY-2, [facility] 04/30/2018 1:19 PM    I have seen and examined this patient and agree with plan and assessment in the above note with renal recommendations/intervention highlighted.  Pt somnolent after RIJ TDC placement today.  Hospitalist is at bedside with family and is ordering CPAP.  Will also transfuse 2 uPRBC's with HD today and ok to restart heparin drip.  Will ask VVS to place AVF during this hospitalization.  Broadus John A Essa Malachi,MD 04/30/2018 2:27 PM

## 2018-04-30 NOTE — Anesthesia Preprocedure Evaluation (Signed)
Anesthesia Evaluation  Patient identified by MRN, date of birth, ID band Patient awake    Reviewed: Allergy & Precautions, NPO status , [name] Chart, lab work & pertinent test results  Airway Mallampati: I  TM Distance: >3 FB Neck ROM: Full    Dental   Pulmonary sleep apnea , COPD, Current Smoker,    Pulmonary exam normal        Cardiovascular hypertension, +CHF  Normal cardiovascular exam     Neuro/Psych Anxiety Depression    GI/Hepatic   Endo/Other    Renal/GU ESRF and DialysisRenal disease     Musculoskeletal   Abdominal   Peds  Hematology   Anesthesia Other Findings   Reproductive/Obstetrics                             Anesthesia Physical Anesthesia Plan  ASA: III  Anesthesia Plan: MAC   Post-op Pain Management:    Induction: Intravenous  PONV Risk Score and Plan: 1  Airway Management Planned: Simple Face Mask  Additional Equipment:   Intra-op Plan:   Post-operative Plan:   Informed Consent: I have reviewed the patients History and Physical, chart, labs and discussed the procedure including the risks, benefits and alternatives for the proposed anesthesia with the patient or authorized representative who has indicated his/her understanding and acceptance.     Plan Discussed with: CRNA and Surgeon  Anesthesia Plan Comments:         Anesthesia Quick Evaluation

## 2018-04-30 NOTE — Transfer of Care (Signed)
Immediate Anesthesia Transfer of Care Note  Patient: Whitney Walker  Procedure(s) Performed: INSERTION OF Right Internal Jugular DIALYSIS CATHETER (Right Neck)  Patient Location: PACU  Anesthesia Type:MAC  Level of Consciousness: awake, alert , oriented and patient cooperative  Airway & Oxygen Therapy: Patient Spontanous Breathing  Post-op Assessment: Report given to RN, Post -op Vital signs reviewed and stable and Patient moving all extremities X 4  Post vital signs: Reviewed and stable  Last Vitals:  Vitals Value Taken Time  BP 106/48 04/30/2018 12:01 PM  Temp    Pulse 85 04/30/2018 12:02 PM  Resp 16 04/30/2018 12:02 PM  SpO2 97 % 04/30/2018 12:02 PM  Vitals shown include unvalidated device data.  Last Pain:  Vitals:   04/30/18 0840  TempSrc: Oral  PainSc:          Complications: No apparent anesthesia complications

## 2018-04-30 NOTE — Progress Notes (Signed)
PROGRESS NOTE    Whitney Walker  VOZ:[id number] DOB: 03/01/1956 DOA: 04/29/2018 PCP: Glendale Chard, MD   Brief Narrative:  62 year old female with history of peripheral vascular disease, COPD, CKD stage IV-5, essential hypertension, colon cancer status post resection 2009, history of DVT and pulmonary embolism on Eliquis, depression, coronary artery disease, anemia of chronic disease, restless leg syndrome, anxiety and depression was brought to the hospital for evaluation of generalized weakness, poor appetite and shortness of breath.  Patient was recently evaluated outpatient by vascular surgery and had tentative plans for long-term access placement but in the meantime this happened therefore came to the ER.  In the ER patient was hyperkalemic, anemia, elevated creatinine of 15. Nephro was consulted, PCCM placed urgent HD line. Vascular surgery was consulted as well.    Assessment & Plan:   Active Problems:   Leucocytosis   Morbid (severe) obesity due to excess calories (HCC)   Hyperkalemia   OSA (obstructive sleep apnea)   ESRD (end stage renal disease) ([place])  Acute on chronic kidney disease, stage V Metabolic acidosis, bicarb 11 Acute metabolic encephalopathy, [place] - Central line was placed by PCCM on 5/1, patient underwent dialysis yesterday and plans for HD again today -Vascular surgery has been consulted who placed tunneled catheter today.  Eventually patient will need long-term dialysis access, would prefer to get it done soon as patient is currently off Eliquis as previously planned outpatient -Nephrology is following.  Routine predialysis labs including hepatitis panel, iron studies, PTH has been drawn. -Check daily labs including phosphorus -Continue sodium bicarb -Appreciate nephrology input -Spoke with Dr. Donzetta Matters on 5/2- Plans for AVF on 5/7 (inpatient or outpatient if gets discahrged). Aware patient is on heparin drip while here.   Leukocytosis, likely reactive but partly  also been chroni.  No signs of active infection Anemia, likely from CKD/chronic disease -Hemoglobin is 6.7 therefore we will give 2 units of PRBC transfusion during HD today -Closely monitor for any signs of infection.  Will start antibiotics if necessary -Continue [place] with dialysis.  She will need to be on iron supplements -Has been evaluated by Dr. Burr Medico for her chronic leukocytosis which has been stable.  BCR/ABL Jak 2 mutation is negative.  No previous evidence of myeloproliferative neoplasm.  Nausea; improved -Secondary to electrolyte imbalance, fluid overload and bowel edema - antiemetics prn  History of recurrent DVT and pulmonary embolism - Patient is routinely on Eliquis.  Currently she is off of this due to tentative plans for dialysis access.  I will start the patient on heparin drip as she is at high risk  Essential hypertension - Norvasc on hold as dialyzing her will improve her blood pressure.  If necessary will add appropriate medications  History of colon cancer status post resection in 2009 - He is due for outpatient colonoscopy for now  History of peripheral vascular disease Congestive heart failure  Obstructive sleep apnea -CPAP ordered.   DVT prophylaxis:  Will start AC when appropriate Code Status: Full  Family Communication:  Both daughter at bedside   Disposition Plan: Maintain inpatient stay  Consultants:   Vascular   Nephrology  Procedures:   Right Tunneled catheter placed on 5/2  Antimicrobials:   None   Subjective: Post Op, she is somnolent but easily arousable. No complaints.   Review of Systems Otherwise negative except as per HPI, including: Difficult to obtain full ROS due to her mentation but overall denies any complaints.   Objective: Vitals:   04/30/18 1216 04/30/18  1230 04/30/18 1231 04/30/18 1246  BP: (!) 117/51  (!) 118/99   Pulse: 84 79 84   Resp: 17 16 (!) 25   Temp:    98.5 F (36.9 C)  TempSrc:      SpO2: 94% 93%  97% 97%  Weight:      Height:        Intake/Output Summary (Last 24 hours) at 04/30/2018 1354 Last data filed at 04/30/2018 1141 Gross per 24 hour  Intake 200 ml  Output -170 ml  Net 370 ml   Filed Weights   04/29/18 1331 04/29/18 2251 04/30/18 1021  Weight: 91.2 kg (201 lb) 90.1 kg (198 lb 10.2 oz) 90.1 kg (198 lb 10.2 oz)    Examination:  General exam: somnolent but easily arounsable; obese Respiratory system: Diminished BS at the bases.  Cardiovascular system: S1 & S2 heard, RRR. No JVD, Systolic murmurs, rubs, gallops or clicks. Trace b/l pitting pedal edema. Gastrointestinal system: Abdomen is nondistended, soft and nontender. No organomegaly or masses felt. Normal bowel sounds heard. Central nervous system: Alert and oriented. No focal neurological deficits. Extremities: Grossly moving all the ext without any issues.  Skin: No rashes, lesions or ulcers Psychiatry: drowsy Right Sided IJ Tunneled catheter noted.    Data Reviewed:   CBC: Recent Labs  Lab 04/29/18 1334 04/30/18 0015  WBC 16.0* 18.7*  HGB 7.0* 6.7*  HCT 21.8* 19.6*  MCV 78.7 75.7*  PLT 224 657   Basic Metabolic Panel: Recent Labs  Lab 04/29/18 1334 04/30/18 0015  NA 136 140  140  K 6.2* 3.0*  3.0*  CL 112* 109  109  CO2 <7* 11*  12*  GLUCOSE 79 85  82  BUN 155* 96*  98*  CREATININE 14.82* 10.01*  10.02*  CALCIUM 7.0* 7.5*  7.5*  PHOS  --  7.1*   GFR: Estimated Creatinine Clearance: 6.2 mL/min (A) (by C-G formula based on SCr of 10.02 mg/dL (H)). Liver Function Tests: Recent Labs  Lab 04/29/18 1653 04/29/18 2000 04/30/18 0015  AST 9*  --   --   ALT 11* 10*  --   ALKPHOS 71  --   --   BILITOT 0.6  --   --   PROT 7.0  --   --   ALBUMIN 2.9*  --  2.8*   No results for input(s): LIPASE, AMYLASE in the last 168 hours. No results for input(s): AMMONIA in the last 168 hours. Coagulation Profile: Recent Labs  Lab 04/29/18 1653  INR 1.29   Cardiac Enzymes: No results for  input(s): CKTOTAL, CKMB, CKMBINDEX, TROPONINI in the last 168 hours. BNP (last 3 results) No results for input(s): PROBNP in the last 8760 hours. HbA1C: No results for input(s): HGBA1C in the last 72 hours. CBG: No results for input(s): GLUCAP in the last 168 hours. Lipid Profile: No results for input(s): CHOL, HDL, LDLCALC, TRIG, CHOLHDL, LDLDIRECT in the last 72 hours. Thyroid Function Tests: No results for input(s): TSH, T4TOTAL, FREET4, T3FREE, THYROIDAB in the last 72 hours. Anemia Panel: Recent Labs    04/30/18 0015  TIBC 151*  IRON 147   Sepsis Labs: No results for input(s): PROCALCITON, LATICACIDVEN in the last 168 hours.  No results found for this or any previous visit (from the past 240 hour(s)).       Radiology Studies: Dg Chest 2 View  Result Date: 04/29/2018 CLINICAL DATA:  Two weeks of shortness of breath. History of CHF, COPD, chronic renal insufficiency stage IV,  colonic malignancy, peripheral vascular disease, current smoker. EXAM: CHEST - 2 VIEW COMPARISON:  PA and lateral chest x-ray of March 02, 2017 FINDINGS: The lungs are mildly hyperinflated. The cardiac silhouette is enlarged. The pulmonary vascularity is mildly engorged. The interstitial markings are increased and more conspicuous than on the previous study. There is no pleural effusion. The bony thorax exhibits no acute abnormality. IMPRESSION: COPD.  Superimposed low-grade CHF.  No alveolar pneumonia. Electronically Signed   By: David  Martinique M.D.   On: 04/29/2018 14:08   Dg Chest Port 1 View  Result Date: 04/30/2018 CLINICAL DATA:  Dialysis cath insertion. EXAM: PORTABLE CHEST 1 VIEW COMPARISON:  04/29/2018. FINDINGS: Dialysis catheter noted with its tip over the upper right atrium. Cardiomegaly with normal pulmonary vascularity. Mild left base atelectasis/infiltrate. Small left pleural effusion. No acute bony abnormality. IMPRESSION: Phthisis catheter with its tip over the upper right atrium. No  pneumothorax. 2.  Cardiomegaly.  No pulmonary venous congestion. 3. Mild left base atelectasis/infiltrate with small left pleural effusion. Electronically Signed   By: Marcello Moores  Register   On: 04/30/2018 12:54   Dg Chest Portable 1 View  Result Date: 04/29/2018 CLINICAL DATA:  Right-sided hemodialysis catheter placement. EXAM: PORTABLE CHEST 1 VIEW COMPARISON:  04/29/2018 FINDINGS: 1843 hours. Right IJ central catheter tip overlies the mid SVC. No right pneumothorax. No evidence for right pleural effusion. The cardio pericardial silhouette is enlarged. Interstitial markings are diffusely coarsened with chronic features. The visualized bony structures of the thorax are intact. Telemetry leads overlie the chest. IMPRESSION: Right IJ central line tip overlies the mid SVC. No pneumothorax or pleural effusion. Electronically Signed   By: [name] Stanley M.D.   On: 04/29/2018 19:05        Scheduled Meds: . darbepoetin (ARANESP) injection - DIALYSIS  200 mcg Intravenous Q Thu-HD  . fentaNYL (SUBLIMAZE) injection  12.5 mcg Intravenous Once  . heparin  5,000 Units Subcutaneous Q8H  . sodium bicarbonate  1,300 mg Oral BID  . vitamin C  1,000 mg Oral QODAY   Continuous Infusions: . sodium chloride    . sodium chloride    . sodium chloride 10 mL/hr at 04/30/18 1040  . sodium chloride       LOS: 1 day    I have spent 35 minutes face to face with the patient and on the ward discussing the patients care, assessment, plan and disposition with other care givers. >50% of the time was devoted counseling the patient about the risks and benefits of treatment and coordinating care.     Ankit Arsenio Loader, MD [facility] Pager [phone]   If 7PM-7AM, please contact night-coverage [email or web address] Password [email or web address] 04/30/2018, 1:54 PM

## 2018-04-30 NOTE — Progress Notes (Signed)
Patient has declined the CPAP at this time. RT went x3 for CPAP. Patient was unavailable x2 and stated that she will call when she is ready the third visit.

## 2018-04-30 NOTE — Progress Notes (Signed)
Patient name: Whitney Walker MRN: [id number] DOB: [birth year] Sex: female    HISTORY OF PRESENT ILLNESS:   Whitney Walker is a 62 y.o. female who is a new start dialysis patient.  She had a temp cath placed yesterday that is not working properly.  She needs a TDC  CURRENT MEDICATIONS:    Current Facility-Administered Medications  Medication Dose Route Frequency Provider Last Rate Last Dose  . [MAR Hold] 0.9 %  sodium chloride infusion  100 mL Intravenous PRN Corliss Parish, MD      . Doug Sou Hold] 0.9 %  sodium chloride infusion  100 mL Intravenous PRN Corliss Parish, MD      . 0.9 %  sodium chloride infusion   Intravenous Continuous Lillia Abed, MD 10 mL/hr at 04/30/18 1040    . [MAR Hold] acetaminophen (TYLENOL) tablet 650 mg  650 mg Oral Q6H PRN Eulogio Bear U, DO       Or  . Doug Sou Hold] acetaminophen (TYLENOL) suppository 650 mg  650 mg Rectal Q6H PRN Vann, Jessica U, DO      . [MAR Hold] albuterol (PROVENTIL) (2.5 MG/3ML) 0.083% nebulizer solution 3 mL  3 mL Inhalation Q6H PRN Vann, Jessica U, DO      . [MAR Hold] alteplase (CATHFLO ACTIVASE) injection 2 mg  2 mg Intracatheter Once PRN Corliss Parish, MD      . Doug Sou Hold] camphor-menthol [facility]) lotion   Topical PRN Geradine Girt, DO      . [MAR Hold] Darbepoetin Alfa (ARANESP) injection 200 mcg  200 mcg Intravenous Q Thu-HD Corliss Parish, MD      . Doug Sou Hold] fentaNYL (SUBLIMAZE) injection 12.5 mcg  12.5 mcg Intravenous Once Vann, Jessica U, DO      . [MAR Hold] heparin injection 1,000 Units  1,000 Units Dialysis PRN Corliss Parish, MD   1,000 Units at 04/29/18 2210  . [MAR Hold] heparin injection 5,000 Units  5,000 Units Subcutaneous Q8H Vann, Jessica U, DO   5,000 Units at 04/30/18 0630  . [MAR Hold] lidocaine (PF) (XYLOCAINE) 1 % injection 5 mL  5 mL Intradermal PRN Corliss Parish, MD      . Doug Sou Hold] lidocaine-prilocaine (EMLA) cream 1 application  1  application Topical PRN Corliss Parish, MD      . Doug Sou Hold] ondansetron [facility]) tablet 4 mg  4 mg Oral Q6H PRN Eulogio Bear U, DO       Or  . Doug Sou Hold] ondansetron (ZOFRAN) injection 4 mg  4 mg Intravenous Q6H PRN Eulogio Bear U, DO   4 mg at 04/30/18 1005  . [MAR Hold] pentafluoroprop-tetrafluoroeth (GEBAUERS) aerosol 1 application  1 application Topical PRN Corliss Parish, MD      . Doug Sou Hold] sodium bicarbonate tablet 1,300 mg  1,300 mg Oral BID Corliss Parish, MD      . Doug Sou Hold] vitamin C (ASCORBIC ACID) tablet 1,000 mg  1,000 mg Oral QODAY Vann, Jessica U, DO        REVIEW OF SYSTEMS:   [X]  denotes positive finding, [ ]  denotes negative finding Cardiac  Comments:  Chest pain or chest pressure:    Shortness of breath upon exertion:    Short of breath when lying flat:    Irregular heart rhythm:    Constitutional    Fever or chills:      PHYSICAL EXAM:   Vitals:   04/29/18 2254 04/30/18 0349 04/30/18 0840 04/30/18 1021  BP: (!) 142/86 (!) 115/55 (!) 119/48  Pulse:  100 88   Resp:  16 17   Temp: 97.9 F (36.6 C) 98.3 F (36.8 C) 98.2 F (36.8 C)   TempSrc: Oral Oral Oral   SpO2:  100% 98%   Weight:    198 lb 10.2 oz (90.1 kg)  Height:    5' 2.99" (1.6 m)    GENERAL: The patient is a well-nourished female, in no acute distress. The vital signs are documented above. CARDIOVASCULAR: There is a regular rate and rhythm. PULMONARY: Non-labored respirations   STUDIES:   none   MEDICAL ISSUES:   Plan for [facility] today  Annamarie Major, MD [facility] [phone] Pager [phone]

## 2018-04-30 NOTE — Progress Notes (Signed)
MD on call paged for additional prn nausea meds.

## 2018-04-30 NOTE — Progress Notes (Signed)
Transport in to take pt to Dialysis via bed. Report given previously to RN.  Pola Corn, RN

## 2018-05-01 ENCOUNTER — Encounter (HOSPITAL_COMMUNITY): Payer: Self-pay | Admitting: Surgery

## 2018-05-01 DIAGNOSIS — G2581 Restless legs syndrome: Secondary | ICD-10-CM

## 2018-05-01 DIAGNOSIS — H9201 Otalgia, right ear: Secondary | ICD-10-CM

## 2018-05-01 LAB — CBC
HCT: 29.1 % — ABNORMAL LOW (ref 36.0–46.0)
Hemoglobin: 10 g/dL — ABNORMAL LOW (ref 12.0–15.0)
MCH: 26.7 pg (ref 26.0–34.0)
MCHC: 34.4 g/dL (ref 30.0–36.0)
MCV: 77.8 fL — ABNORMAL LOW (ref 78.0–100.0)
Platelets: 206 10*3/uL (ref 150–400)
RBC: 3.74 MIL/uL — ABNORMAL LOW (ref 3.87–5.11)
RDW: 16.2 % — ABNORMAL HIGH (ref 11.5–15.5)
WBC: 14 10*3/uL — ABNORMAL HIGH (ref 4.0–10.5)

## 2018-05-01 LAB — BPAM RBC
Blood Product Expiration Date: 201905312359
Blood Product Expiration Date: 201905312359
ISSUE DATE / TIME: 201905022014
ISSUE DATE / TIME: 201905022014
Unit Type and Rh: 7300
Unit Type and Rh: 7300

## 2018-05-01 LAB — RENAL FUNCTION PANEL
Albumin: 2.9 g/dL — ABNORMAL LOW (ref 3.5–5.0)
Anion gap: 21 — ABNORMAL HIGH (ref 5–15)
BUN: 44 mg/dL — ABNORMAL HIGH (ref 6–20)
CO2: 19 mmol/L — ABNORMAL LOW (ref 22–32)
Calcium: 7.7 mg/dL — ABNORMAL LOW (ref 8.9–10.3)
Chloride: 102 mmol/L (ref 101–111)
Creatinine, Ser: 5.71 mg/dL — ABNORMAL HIGH (ref 0.44–1.00)
GFR calc Af Amer: 8 mL/min — ABNORMAL LOW (ref >60)
GFR calc non Af Amer: 7 mL/min — ABNORMAL LOW (ref >60)
Glucose, Bld: 115 mg/dL — ABNORMAL HIGH (ref 65–99)
Phosphorus: 4.7 mg/dL — ABNORMAL HIGH (ref 2.5–4.6)
Potassium: 3.3 mmol/L — ABNORMAL LOW (ref 3.5–5.1)
Sodium: 142 mmol/L (ref 135–145)

## 2018-05-01 LAB — TYPE AND SCREEN
ABO/RH(D): B POS
Antibody Screen: NEGATIVE
Unit division: 0
Unit division: 0

## 2018-05-01 LAB — MAGNESIUM: Magnesium: 1.6 mg/dL — ABNORMAL LOW (ref 1.7–2.4)

## 2018-05-01 LAB — PARATHYROID HORMONE, INTACT (NO CA): PTH: 382 pg/mL — ABNORMAL HIGH (ref 15–65)

## 2018-05-01 LAB — HEPARIN LEVEL (UNFRACTIONATED)
Heparin Unfractionated: 0.34 IU/mL (ref 0.30–0.70)
Heparin Unfractionated: 2.12 IU/mL — ABNORMAL HIGH (ref 0.30–0.70)
Heparin Unfractionated: 0.48 IU/mL (ref 0.30–0.70)

## 2018-05-01 LAB — HEPATITIS B CORE ANTIBODY, TOTAL: Hep B Core Total Ab: NEGATIVE

## 2018-05-01 LAB — HEPATITIS B SURFACE ANTIBODY,QUALITATIVE: Hep B S Ab: NONREACTIVE

## 2018-05-01 LAB — HEPATITIS B SURFACE ANTIGEN: Hepatitis B Surface Ag: NEGATIVE

## 2018-05-01 MED ORDER — CIPROFLOXACIN-DEXAMETHASONE 0.3-0.1 % OT SUSP
4.0000 [drp] | Freq: Two times a day (BID) | OTIC | Status: DC
  Administered 2018-05-01 – 2018-05-02 (×4): 4 [drp] via OTIC
  Filled 2018-05-01: qty 7.5

## 2018-05-01 MED ORDER — CYCLOBENZAPRINE HCL 10 MG PO TABS: 5.0000 mg | ORAL_TABLET | Freq: Once | ORAL | Status: DC

## 2018-05-01 MED ORDER — CALCITRIOL 1 MCG/ML IV SOLN
0.5000 ug | INTRAVENOUS | Status: DC
  Filled 2018-05-01: qty 0.5

## 2018-05-01 MED ORDER — RENA-VITE PO TABS
1.0000 | ORAL_TABLET | Freq: Every day | ORAL | Status: DC
  Administered 2018-05-01 – 2018-05-06 (×6): 1 via ORAL
  Filled 2018-05-01 (×6): qty 1

## 2018-05-01 MED ORDER — SUCROFERRIC OXYHYDROXIDE 500 MG PO CHEW
500.0000 mg | CHEWABLE_TABLET | Freq: Three times a day (TID) | ORAL | Status: DC
Start: 2018-05-01 — End: 2018-05-05
  Filled 2018-05-01 (×12): qty 1

## 2018-05-01 MED ORDER — PRAMIPEXOLE DIHYDROCHLORIDE 0.25 MG PO TABS
0.1250 mg | ORAL_TABLET | Freq: Three times a day (TID) | ORAL | Status: DC
  Administered 2018-05-01 – 2018-05-07 (×19): 0.125 mg via ORAL
  Filled 2018-05-01 (×19): qty 1

## 2018-05-01 MED ORDER — PROMETHAZINE HCL 25 MG/ML IJ SOLN
12.5000 mg | Freq: Three times a day (TID) | INTRAMUSCULAR | Status: DC | PRN
  Administered 2018-05-01 – 2018-05-02 (×3): 12.5 mg via INTRAVENOUS
  Filled 2018-05-01 (×3): qty 1

## 2018-05-01 NOTE — Progress Notes (Signed)
[place] for heparin Indication: history of PE  Allergies  Allergen Reactions  . Gadolinium Derivatives Hives and Other (See Comments)    HIVES, Desc: HIVES W/ "DYE" USED FOR 1ST CT SCAN BUT NOT 2ND, NO PREMEDS USED, PT UNCERTAIN OF CIRCUMSTANCES,,?POSSIBLE MRI CONTRAST ALLERGY, ALL STUDIES DONE "SOMEWHERE" IN [place]//A.C., Onset Date: [id number]  . Iohexol Other (See Comments)     Code: HIVES, Desc: HIVES W/ "DYE" USED FOR 1ST CT SCAN BUT NOT 2ND, NO PREMEDS USED, PT UNCERTAIN OF CIRCUMSTANCES,,?POSSIBLE MRI CONTRAST ALLERGY, ALL STUDIES DONE "SOMEWHERE" IN [place]//A.C., Onset Date: [id number]     Patient Measurements: Height: 5' 2.99" (160 cm) Weight: 196 lb 3.4 oz (89 kg) IBW/kg (Calculated) : 52.38 Heparin Dosing Weight: 73  Vital Signs: Temp: 98.1 F (36.7 C) (05/03 0810) Temp Source: Oral (05/03 0810) BP: 157/74 (05/03 0810) Pulse Rate: 88 (05/03 0810)  Labs: Recent Labs    04/29/18 1334 04/29/18 1653 04/30/18 0015 05/01/18 0036 05/01/18 0831  HGB 7.0*  --  6.7* 10.0*  --   HCT 21.8*  --  19.6* 29.1*  --   PLT 224  --  212 206  --   LABPROT  --  15.9*  --   --   --   INR  --  1.29  --   --   --   HEPARINUNFRC  --   --   --  0.34 2.12*  CREATININE 14.82*  --  10.01*  10.02* 5.71*  --     Estimated Creatinine Clearance: 10.8 mL/min (A) (by C-G formula based on SCr of 5.71 mg/dL (H)).   Medical History: Past Medical History:  Diagnosis Date  . Anemia   . Anxiety   . Arthritis   . CHF (congestive heart failure) ([place])   . CKD (chronic kidney disease)    Stage III 03/2013  . Colon cancer ([place])   . COPD (chronic obstructive pulmonary disease) ([place])   . Coronary artery disease   . Depression   . Hypertension   . Meningitis   . PE (pulmonary embolism)   . Peripheral vascular disease ([place])   . Shortness of breath   . Sleep apnea   . Vertigo     Assessment: 62 year old female s/p HD access with history of  PE. Patient historically on apixaban but plans were to possibly change to warfarin once HD access was completed.   Last dose of apixaban 4/19 (was hold hold for access pta).  Follow up heparin level this morning at goal (earlier level was high due to improper draw too close to infusion). No issues noted.   AVF planned for 5/7  Goal of Therapy:  Heparin level 0.3-0.7 units/ml Monitor platelets by anticoagulation protocol: Yes   Plan:  Continue heparin at 1000 units/hr Daily heparin level  Erin Hearing PharmD., BCPS Clinical Pharmacist 05/01/2018 11:10 AM

## 2018-05-01 NOTE — Progress Notes (Signed)
Patient refused the CPAP at this time due to nausea and vomiting.

## 2018-05-01 NOTE — Progress Notes (Addendum)
[facility] Progress Note    Assessment/ Plan:   62 yo female with CKD V, morbid obesity, hypertension, congestive heart failure, CAD, COPD, reported history of colon cancer and depression. She has been followed by Dr. [name] for advanced CKD without uremia, last seen on 04/14/18, with plans for outpatient fistula placement on 05/01/18. Now admitted with confusion and weakness from uremia due to worsening kidney function requiring urgent dialysis.   1. CKD with progression to ESRD and uremia. S/p TDC placement and started on HD with improved BUN 155>44 and mental status. Next HD tomorrow. [facility] notified by primary team to address perm access as has been off eliquis for previously planned outpatient, possibly planned for Tuesday 5/7. 2. Secondary hyperparathyroidism. PTH 382. Start calcitriol 0.20mcg with HD 3. HTN. Holding home amlodipine and labetalol as BP improving with HD. 4. Metabolic acidosis secondary to renal failure, improving with HD. CO2 19 and anion gap 21. Stop po bicarb 5. Anemia due to CKD. Hgb 6.7, now s/p 2U RBC with improvement of Hgb to 10.0.  Started on aranesp 250mcg weekly. Was on po iron as outpatient, continued on ferrous sulfate 325mg  po qd per primary. Iron sat 97%. Stopped po Fe to simplify regimen 6. Elevated phosphorous 4.7, start velphoro TID AC  Subjective:   States breathing is improved today, still mildly short of breath and a little nauseous. States heparin has made her nauseous in the past and would like to be on something else. No lightheadedness. No chest pain. No swelling of her legs. She states she is somewhat frustrated with being in the hospital as she feels overall well and was urinating well before being admitted.    Objective:   BP (!) 157/74 (BP Location: Right Arm)   Pulse 93   Temp 98.5 F (36.9 C) (Oral)   Resp 18   Ht 5' 2.99" (1.6 m)   Wt 196 lb 3.4 oz (89 kg)   SpO2 94%   BMI 34.77 kg/m   Intake/Output Summary (Last 24 hours) at  05/01/2018 0740 Last data filed at 04/30/2018 2225 Gross per 24 hour  Intake 830 ml  Output 1094 ml  Net -264 ml   Weight change: -2 lb 5.9 oz (-1.073 kg)  Physical Exam: Gen: sitting up on side of bed, in NAD CVS: regular, S1 and S2 normal, grade II systolic murmur Resp: CTAB with good air movement. Normal effort on room air Abd: obese, soft, nontender, nondistended, no hepatomegaly Ext: no edema RIJ PC  Imaging: Dg Chest 2 View  Result Date: 04/29/2018 CLINICAL DATA:  Two weeks of shortness of breath. History of CHF, COPD, chronic renal insufficiency stage IV, colonic malignancy, peripheral vascular disease, current smoker. EXAM: CHEST - 2 VIEW COMPARISON:  PA and lateral chest x-ray of March 02, 2017 FINDINGS: The lungs are mildly hyperinflated. The cardiac silhouette is enlarged. The pulmonary vascularity is mildly engorged. The interstitial markings are increased and more conspicuous than on the previous study. There is no pleural effusion. The bony thorax exhibits no acute abnormality. IMPRESSION: COPD.  Superimposed low-grade CHF.  No alveolar pneumonia. Electronically Signed   By: David  Martinique M.D.   On: 04/29/2018 14:08   Dg Chest Port 1 View  Result Date: 04/30/2018 CLINICAL DATA:  Dialysis cath insertion. EXAM: PORTABLE CHEST 1 VIEW COMPARISON:  04/29/2018. FINDINGS: Dialysis catheter noted with its tip over the upper right atrium. Cardiomegaly with normal pulmonary vascularity. Mild left base atelectasis/infiltrate. Small left pleural effusion. No acute bony abnormality.  IMPRESSION: Phthisis catheter with its tip over the upper right atrium. No pneumothorax. 2.  Cardiomegaly.  No pulmonary venous congestion. 3. Mild left base atelectasis/infiltrate with small left pleural effusion. Electronically Signed   By: Marcello Moores  Register   On: 04/30/2018 12:54   Dg Chest Portable 1 View  Result Date: 04/29/2018 CLINICAL DATA:  Right-sided hemodialysis catheter placement. EXAM: PORTABLE CHEST 1  VIEW COMPARISON:  04/29/2018 FINDINGS: 1843 hours. Right IJ central catheter tip overlies the mid SVC. No right pneumothorax. No evidence for right pleural effusion. The cardio pericardial silhouette is enlarged. Interstitial markings are diffusely coarsened with chronic features. The visualized bony structures of the thorax are intact. Telemetry leads overlie the chest. IMPRESSION: Right IJ central line tip overlies the mid SVC. No pneumothorax or pleural effusion. Electronically Signed   By: [name] Stanley M.D.   On: 04/29/2018 19:05    Labs: BMET Recent Labs  Lab 04/29/18 1334 04/30/18 0015 05/01/18 0036  NA 136 140  140 142  K 6.2* 3.0*  3.0* 3.3*  CL 112* 109  109 102  CO2 <7* 11*  12* 19*  GLUCOSE 79 85  82 115*  BUN 155* 96*  98* 44*  CREATININE 14.82* 10.01*  10.02* 5.71*  CALCIUM 7.0* 7.5*  7.5* 7.7*  PHOS  --  7.1* 4.7*   CBC Recent Labs  Lab 04/29/18 1334 04/30/18 0015 05/01/18 0036  WBC 16.0* 18.7* 14.0*  HGB 7.0* 6.7* 10.0*  HCT 21.8* 19.6* 29.1*  MCV 78.7 75.7* 77.8*  PLT 224 212 206    Medications:    . cyclobenzaprine  5 mg Oral Once  . darbepoetin (ARANESP) injection - DIALYSIS  200 mcg Intravenous Q Thu-HD  . fentaNYL (SUBLIMAZE) injection  12.5 mcg Intravenous Once  . ferrous sulfate  325 mg Oral Q breakfast  . sodium bicarbonate  1,300 mg Oral BID  . vitamin C  1,000 mg Oral QODAY      Bufford Lope, DO PGY-2, [facility] 05/01/2018 7:40 AM   I have seen and examined this patient and agree with the plan of care seen ,eval, examined, counseled patient. Discussed with resident .  Jeneen Rinks Maleaha Hughett 05/01/2018, 12:46 PM

## 2018-05-01 NOTE — Plan of Care (Signed)
  Problem: Education: Goal: Knowledge of General Education information will improve Outcome: Progressing   Problem: Clinical Measurements: Goal: Ability to maintain clinical measurements within normal limits will improve Outcome: Not Progressing Goal: Diagnostic test results will improve Outcome: Not Progressing   Problem: Coping: Goal: Level of anxiety will decrease Outcome: Not Progressing

## 2018-05-01 NOTE — Progress Notes (Signed)
   She now has a catheter in the right IJ that worked well for dialysis last night.  We will keep on the schedule for permanent access creation her left upper extremity which will be a fistula versus graft.  She continues to demonstrate frustration with the situation and does not appear to have a clear understanding of dialysis in general and in particular the surgery that we are planning Tuesday.  I have answered all her questions and will again attempt to talk with her on Monday about the operation.  If she needs to be discharged prior to Tuesday she can do that and we can perform the surgery as an outpatient as previously planned with holding her po anticoagulation for 48 hours prior.  If she stays in the hospital heparin can be continued until she is called the surgery with her extensive history of DVTs.  Please contact on-call vascular surgeon over the weekend with further questions I will otherwise see her on Monday.  Ilana Prezioso C. Donzetta Matters, MD [facility] Office: [phone] Pager: [phone]

## 2018-05-01 NOTE — Op Note (Signed)
    Patient name: Whitney Walker MRN: [id number] DOB: [birth year] Sex: female  04/30/2018 Pre-operative Diagnosis: ESRD Post-operative diagnosis:  Same Surgeon:  Annamarie Major Assistants:  Holly Bodily Procedure:   Removal of temporary dialysis catheter and placement of tunneled 23 cm dialysis catheter Anesthesia: MAC Blood Loss: Minimal Specimens: None  Findings: Catheter tip at cavoatrial junction  Indications: The patient had a temporary catheter placed yesterday which is having poor flow rates.  The patient is here for tunneled dialysis catheter.  I discussed the risks and benefits of the procedure with the patient and her family and she wished to proceed.  Procedure:  The patient was identified in the holding area and taken to [place] [address]  The patient was then placed supine on the table. MAC anesthesia was administered.  The patient was prepped and draped in the usual sterile fashion.  A time out was called and antibiotics were administered.  A Bentson wire was inserted through the central line portion of the try trialysis catheter.  The catheter was then removed and a peel-away sheath was inserted.  A 23 cm Pallin catheter was inserted through the peel-away sheath which was then removed.  A skin exit site was selected.drome local anesthesia was then inserted.  A stab incision was made and then a subcutaneous tunnel was created.  The catheter was brought through the tunnel with the cuff situated at the skin exit site.  The hub was connected to the ports, both of which flushed and aspirated without difficulty.  Fluoroscopy was used to confirm that the catheter tip was at the cavoatrial junction.  The catheter was then secured into position with 3-0 nylon suture.  The neck incision was closed with 4-0 Vicryl and Dermabond.  Appropriate volumes of heparin were inserted into the catheter.  There were no immediate complications.  Sterile dressings were applied.   Disposition: To PACU stable   V. Annamarie Major, M.D. [facility] Office: [phone] Pager:  [phone]

## 2018-05-01 NOTE — Progress Notes (Signed)
Pt c/o pain in legs. MD on call paged, pt states that she won't take any of the meds.

## 2018-05-01 NOTE — Progress Notes (Signed)
[place] for heparin Indication: history of PE  Allergies  Allergen Reactions  . Gadolinium Derivatives Hives and Other (See Comments)    HIVES, Desc: HIVES W/ "DYE" USED FOR 1ST CT SCAN BUT NOT 2ND, NO PREMEDS USED, PT UNCERTAIN OF CIRCUMSTANCES,,?POSSIBLE MRI CONTRAST ALLERGY, ALL STUDIES DONE "SOMEWHERE" IN [place]//A.C., Onset Date: [id number]  . Iohexol Other (See Comments)     Code: HIVES, Desc: HIVES W/ "DYE" USED FOR 1ST CT SCAN BUT NOT 2ND, NO PREMEDS USED, PT UNCERTAIN OF CIRCUMSTANCES,,?POSSIBLE MRI CONTRAST ALLERGY, ALL STUDIES DONE "SOMEWHERE" IN [place]//A.C., Onset Date: [id number]     Patient Measurements: Height: 5' 2.99" (160 cm) Weight: 193 lb 9 oz (87.8 kg) IBW/kg (Calculated) : 52.38 Heparin Dosing Weight: 73  Vital Signs: Temp: 97.6 F (36.4 C) (05/02 2301) Temp Source: Oral (05/02 2301) BP: 122/67 (05/02 2301) Pulse Rate: 93 (05/02 2301)  Labs: Recent Labs    04/29/18 1334 04/29/18 1653 04/30/18 0015 05/01/18 0036  HGB 7.0*  --  6.7* 10.0*  HCT 21.8*  --  19.6* 29.1*  PLT 224  --  212 206  LABPROT  --  15.9*  --   --   INR  --  1.29  --   --   HEPARINUNFRC  --   --   --  0.34  CREATININE 14.82*  --  10.01*  10.02* 5.71*    Estimated Creatinine Clearance: 10.7 mL/min (A) (by C-G formula based on SCr of 5.71 mg/dL (H)).   Medical History: Past Medical History:  Diagnosis Date  . Anemia   . Anxiety   . Arthritis   . CHF (congestive heart failure) ([employer])   . CKD (chronic kidney disease)    Stage III 03/2013  . Colon cancer ([place])   . COPD (chronic obstructive pulmonary disease) ([place])   . Coronary artery disease   . Depression   . Hypertension   . Meningitis   . PE (pulmonary embolism)   . Peripheral vascular disease ([place])   . Shortness of breath   . Sleep apnea   . Vertigo     Assessment: 62 year old female s/p HD access with history of PE. Patient historically on apixaban but plans were  to possibly change to warfarin once HD access was completed.   Last dose of apixaban 4/19 (was hold hold for access pta).  Initial heparin level is therapeutic  Goal of Therapy:  Heparin level 0.3-0.7 units/ml Monitor platelets by anticoagulation protocol: Yes   Plan:  Continue heparin at 1000 units/hr Check level later today to confirm  [place] Pharmacist 05/01/2018 2:22 AM

## 2018-05-01 NOTE — Progress Notes (Signed)
PROGRESS NOTE    Whitney Walker  ZWC:[id number] DOB: 01-24-1956 DOA: 04/29/2018 PCP: Glendale Chard, MD   Brief Narrative:  62 year old female with history of peripheral vascular disease, COPD, CKD stage IV-5, essential hypertension, colon cancer status post resection 2009, history of DVT and pulmonary embolism on Eliquis, depression, coronary artery disease, anemia of chronic disease, restless leg syndrome, anxiety and depression was brought to the hospital for evaluation of generalized weakness, poor appetite and shortness of breath.  Patient was recently evaluated outpatient by vascular surgery and had tentative plans for long-term access placement but in the meantime this happened therefore came to the ER.  In the ER patient was hyperkalemic, anemia, elevated creatinine of 15. Nephro was consulted, PCCM placed urgent HD line. Vascular surgery was consulted as well.    Assessment & Plan:   Active Problems:   Leucocytosis   Morbid (severe) obesity due to excess calories (HCC)   Hyperkalemia   OSA (obstructive sleep apnea)   ESRD (end stage renal disease) ([place])  Acute on chronic kidney disease, stage V Metabolic acidosis, bicarb 11 Acute metabolic encephalopathy, [place]. Improving with HD - Central line was placed by PCCM on 5/1, Tunneled catheter in place which was placed by Vascular surgery 5/2.  Plan is for her to get AV fistula per vascular surgery on Tuesday-if she gets discharged this can be done as outpatient as previously scheduled on 5/7.  She was to like to hold p.o. anticoagulation 48 hours prior to this.  If she ends up staying her heparin can be held the morning of surgery. Appreciate Vascular Input.  -Nephrology is following.  HD at their discretion. BUN has trended down, Bicarb is improving.  -Check daily labs including phosphorus -Appreciate nephrology input  Right Sided Ear pain along with Pressure and some drainage -I did not appreciate any drainage but she keeps  reporting of pressure and pain behind her right ear which is causing her difficulty hearing.  I spoke with Dr [name] who recommended starting patient on Ciprodex 4 drops twice daily for 7 days and follow-up at this clinic after discharge (his Office number is [phone]). No further intervention in the hospital.   Restless leg syndome -Start Pramipexole   Leukocytosis, likely reactive but partly also been chroni.  No signs of active infection Anemia, likely from CKD/chronic disease -Received 2 units of hemoglobin on 5/2.  Hemoglobin is now stable at 10.0 -Closely monitor for any signs of infection.  Will start antibiotics if necessary -Continue [place] with dialysis.  She will need to be on iron supplements -Has been evaluated by Dr. Burr Medico for her chronic leukocytosis which has been stable.  BCR/ABL Jak 2 mutation is negative.  No previous evidence of myeloproliferative neoplasm.  Nausea; improved -Secondary to electrolyte imbalance, fluid overload and bowel edema - antiemetics prn.  Will add Phenergan  History of recurrent DVT and pulmonary embolism - Currently patient is on heparin drip.  Outpatient she was on Eliquis.  If she gets discharged go home on Eliquis but will have to be held 48 hours prior to her surgery.  Essential hypertension - Norvasc on hold as dialyzing her will improve her blood pressure.  If necessary will add appropriate medications  History of colon cancer status post resection in 2009 - He is due for outpatient colonoscopy for now  History of peripheral vascular disease Congestive heart failure  Obstructive sleep apnea -CPAP ordered.   DVT prophylaxis: Currently on heparin drip Code Status: Full  Family Communication:  None at bedside Disposition Plan: Maintain inpatient stay for dialysis until cleared by nephrology.  Consultants:   Vascular   Nephrology  Procedures:   Right Tunneled catheter placed on 5/2  Antimicrobials:    None   Subjective: Patient reports of right-sided ear pain along with pressure and states she had some discharge previously as well which I did not appreciate.  She admits of fullness along with slightly decrease in hearing. He also keeps saying she has very restless leg and was will not be able to sit through her dialysis. Continues to say she has nausea but denies any complaints.  Review of Systems Otherwise negative except as per HPI, including: HEENT/EYES = , redness, loss of vision, double vision, blurred vision,, sore throat, hoarseness, dysphagia Cardiovascular= negative for chest pain, palpitation, murmurs, lower extremity swelling Respiratory/lungs= negative for shortness of breath, cough, hemoptysis, wheezing, mucus production Gastrointestinal= negative for , vomiting,, abdominal pain, melena, hematemesis Genitourinary= negative for Dysuria, Hematuria, Change in Urinary Frequency MSK = Negative for arthralgia, myalgias, Back Pain, Joint swelling  Neurology= Negative for headache, seizures, numbness, tingling  Psychiatry= Negative for anxiety, depression, suicidal and homocidal ideation Allergy/Immunology= Medication/Food allergy as listed  Skin= Negative for Rash, lesions, ulcers, itching   Objective: Vitals:   04/30/18 2235 04/30/18 2301 05/01/18 0421 05/01/18 0810  BP: (!) 123/59 122/67 (!) 157/74 (!) 157/74  Pulse: 85 93 93 88  Resp: 13 18 18 14   Temp: 98 F (36.7 C) 97.6 F (36.4 C) 98.5 F (36.9 C) 98.1 F (36.7 C)  TempSrc: Oral Oral Oral Oral  SpO2: 95% 97% 94% 96%  Weight: 87.8 kg (193 lb 9 oz)  89 kg (196 lb 3.4 oz)   Height:        Intake/Output Summary (Last 24 hours) at 05/01/2018 1140 Last data filed at 05/01/2018 0940 Gross per 24 hour  Intake 890 ml  Output 1091 ml  Net -201 ml   Filed Weights   04/30/18 1915 04/30/18 2235 05/01/18 0421  Weight: 88.8 kg (195 lb 12.3 oz) 87.8 kg (193 lb 9 oz) 89 kg (196 lb 3.4 oz)     Examination: Constitutional: NAD, calm, comfortable Eyes: PERRL, lids and conjunctivae normal ENMT: Mucous membranes are moist. Posterior pharynx clear of any exudate or lesions.Normal dentition.  Slightly tender to touch when pulled on the ear but no drainage or erythema appreciated. Neck: normal, supple, no masses, no thyromegaly Respiratory: clear to auscultation bilaterally, no wheezing, no crackles. Normal respiratory effort. No accessory muscle use.  Cardiovascular: Regular rate and rhythm, no murmurs / rubs / gallops. No extremity edema. 2+ pedal pulses. No carotid bruits.  Abdomen: no tenderness, no masses palpated. No hepatosplenomegaly. Bowel sounds positive.  Musculoskeletal: no clubbing / cyanosis. No joint deformity upper and lower extremities. Good ROM, no contractures. Normal muscle tone.  Skin: no rashes, lesions, ulcers. No induration Neurologic: CN 2-12 grossly intact. Sensation intact, DTR normal. Strength 5/5 in all 4.  Psychiatric: Normal judgment and insight. Alert and oriented x 3. Normal mood.   Data Reviewed:   CBC: Recent Labs  Lab 04/29/18 1334 04/30/18 0015 05/01/18 0036  WBC 16.0* 18.7* 14.0*  HGB 7.0* 6.7* 10.0*  HCT 21.8* 19.6* 29.1*  MCV 78.7 75.7* 77.8*  PLT 224 212 532   Basic Metabolic Panel: Recent Labs  Lab 04/29/18 1334 04/30/18 0015 05/01/18 0036  NA 136 140  140 142  K 6.2* 3.0*  3.0* 3.3*  CL 112* 109  109 102  CO2 <7*  11*  12* 19*  GLUCOSE 79 85  82 115*  BUN 155* 96*  98* 44*  CREATININE 14.82* 10.01*  10.02* 5.71*  CALCIUM 7.0* 7.5*  7.5* 7.7*  MG  --   --  1.6*  PHOS  --  7.1* 4.7*   GFR: Estimated Creatinine Clearance: 10.8 mL/min (A) (by C-G formula based on SCr of 5.71 mg/dL (H)). Liver Function Tests: Recent Labs  Lab 04/29/18 1653 04/29/18 2000 04/30/18 0015 05/01/18 0036  AST 9*  --   --   --   ALT 11* 10*  --   --   ALKPHOS 71  --   --   --   BILITOT 0.6  --   --   --   PROT 7.0  --   --   --    ALBUMIN 2.9*  --  2.8* 2.9*   No results for input(s): LIPASE, AMYLASE in the last 168 hours. No results for input(s): AMMONIA in the last 168 hours. Coagulation Profile: Recent Labs  Lab 04/29/18 1653  INR 1.29   Cardiac Enzymes: No results for input(s): CKTOTAL, CKMB, CKMBINDEX, TROPONINI in the last 168 hours. BNP (last 3 results) No results for input(s): PROBNP in the last 8760 hours. HbA1C: No results for input(s): HGBA1C in the last 72 hours. CBG: Recent Labs  Lab 04/30/18 2308  GLUCAP 124*   Lipid Profile: No results for input(s): CHOL, HDL, LDLCALC, TRIG, CHOLHDL, LDLDIRECT in the last 72 hours. Thyroid Function Tests: No results for input(s): TSH, T4TOTAL, FREET4, T3FREE, THYROIDAB in the last 72 hours. Anemia Panel: Recent Labs    04/30/18 0015  TIBC 151*  IRON 147   Sepsis Labs: No results for input(s): PROCALCITON, LATICACIDVEN in the last 168 hours.  No results found for this or any previous visit (from the past 240 hour(s)).       Radiology Studies: Dg Chest 2 View  Result Date: 04/29/2018 CLINICAL DATA:  Two weeks of shortness of breath. History of CHF, COPD, chronic renal insufficiency stage IV, colonic malignancy, peripheral vascular disease, current smoker. EXAM: CHEST - 2 VIEW COMPARISON:  PA and lateral chest x-ray of March 02, 2017 FINDINGS: The lungs are mildly hyperinflated. The cardiac silhouette is enlarged. The pulmonary vascularity is mildly engorged. The interstitial markings are increased and more conspicuous than on the previous study. There is no pleural effusion. The bony thorax exhibits no acute abnormality. IMPRESSION: COPD.  Superimposed low-grade CHF.  No alveolar pneumonia. Electronically Signed   By: David  Martinique M.D.   On: 04/29/2018 14:08   Dg Chest Port 1 View  Result Date: 04/30/2018 CLINICAL DATA:  Dialysis cath insertion. EXAM: PORTABLE CHEST 1 VIEW COMPARISON:  04/29/2018. FINDINGS: Dialysis catheter noted with its tip over  the upper right atrium. Cardiomegaly with normal pulmonary vascularity. Mild left base atelectasis/infiltrate. Small left pleural effusion. No acute bony abnormality. IMPRESSION: Phthisis catheter with its tip over the upper right atrium. No pneumothorax. 2.  Cardiomegaly.  No pulmonary venous congestion. 3. Mild left base atelectasis/infiltrate with small left pleural effusion. Electronically Signed   By: Marcello Moores  Register   On: 04/30/2018 12:54   Dg Chest Portable 1 View  Result Date: 04/29/2018 CLINICAL DATA:  Right-sided hemodialysis catheter placement. EXAM: PORTABLE CHEST 1 VIEW COMPARISON:  04/29/2018 FINDINGS: 1843 hours. Right IJ central catheter tip overlies the mid SVC. No right pneumothorax. No evidence for right pleural effusion. The cardio pericardial silhouette is enlarged. Interstitial markings are diffusely coarsened with chronic features. The  visualized bony structures of the thorax are intact. Telemetry leads overlie the chest. IMPRESSION: Right IJ central line tip overlies the mid SVC. No pneumothorax or pleural effusion. Electronically Signed   By: [name] Stanley M.D.   On: 04/29/2018 19:05        Scheduled Meds: . [START ON 05/02/2018] calcitRIOL  0.5 mcg Intravenous Q T,Th,Sa-HD  . ciprofloxacin-dexamethasone  4 drop Right EAR BID  . cyclobenzaprine  5 mg Oral Once  . darbepoetin (ARANESP) injection - DIALYSIS  200 mcg Intravenous Q Thu-HD  . fentaNYL (SUBLIMAZE) injection  12.5 mcg Intravenous Once  . multivitamin  1 tablet Oral QHS  . pramipexole  0.125 mg Oral TID  . sucroferric oxyhydroxide  500 mg Oral TID WC   Continuous Infusions: . sodium chloride    . sodium chloride    . sodium chloride 10 mL/hr at 04/30/18 1040  . sodium chloride    . heparin 1,000 Units/hr (04/30/18 1814)     LOS: 2 days    I have spent 30 minutes face to face with the patient and on the ward discussing the patients care, assessment, plan and disposition with other care givers. >50% of  the time was devoted counseling the patient about the risks and benefits of treatment and coordinating care.     Ankit Arsenio Loader, MD [facility] Pager [phone]   If 7PM-7AM, please contact night-coverage [email or web address] Password [email or web address] 05/01/2018, 11:40 AM

## 2018-05-02 DIAGNOSIS — E876 Hypokalemia: Secondary | ICD-10-CM

## 2018-05-02 LAB — RENAL FUNCTION PANEL
Albumin: 2.9 g/dL — ABNORMAL LOW (ref 3.5–5.0)
Anion gap: 16 — ABNORMAL HIGH (ref 5–15)
BUN: 52 mg/dL — ABNORMAL HIGH (ref 6–20)
CO2: 21 mmol/L — ABNORMAL LOW (ref 22–32)
Calcium: 8.1 mg/dL — ABNORMAL LOW (ref 8.9–10.3)
Chloride: 102 mmol/L (ref 101–111)
Creatinine, Ser: 7.83 mg/dL — ABNORMAL HIGH (ref 0.44–1.00)
GFR calc Af Amer: 6 mL/min — ABNORMAL LOW (ref >60)
GFR calc non Af Amer: 5 mL/min — ABNORMAL LOW (ref >60)
Glucose, Bld: 105 mg/dL — ABNORMAL HIGH (ref 65–99)
Phosphorus: 4.8 mg/dL — ABNORMAL HIGH (ref 2.5–4.6)
Potassium: 2.9 mmol/L — ABNORMAL LOW (ref 3.5–5.1)
Sodium: 139 mmol/L (ref 135–145)

## 2018-05-02 LAB — CBC
HCT: 30.8 % — ABNORMAL LOW (ref 36.0–46.0)
Hemoglobin: 10.1 g/dL — ABNORMAL LOW (ref 12.0–15.0)
MCH: 26.3 pg (ref 26.0–34.0)
MCHC: 32.8 g/dL (ref 30.0–36.0)
MCV: 80.2 fL (ref 78.0–100.0)
Platelets: 202 10*3/uL (ref 150–400)
RBC: 3.84 MIL/uL — ABNORMAL LOW (ref 3.87–5.11)
RDW: 16.1 % — ABNORMAL HIGH (ref 11.5–15.5)
WBC: 21.6 10*3/uL — ABNORMAL HIGH (ref 4.0–10.5)

## 2018-05-02 LAB — HEPARIN LEVEL (UNFRACTIONATED): Heparin Unfractionated: 0.41 IU/mL (ref 0.30–0.70)

## 2018-05-02 LAB — MAGNESIUM: Magnesium: 1.6 mg/dL — ABNORMAL LOW (ref 1.7–2.4)

## 2018-05-02 MED ORDER — POTASSIUM CHLORIDE CRYS ER 20 MEQ PO TBCR
40.0000 meq | EXTENDED_RELEASE_TABLET | Freq: Once | ORAL | Status: AC
  Administered 2018-05-02: 40 meq via ORAL
  Filled 2018-05-02: qty 2

## 2018-05-02 MED ORDER — CALCITRIOL 0.25 MCG PO CAPS
0.5000 ug | ORAL_CAPSULE | ORAL | Status: DC
  Administered 2018-05-02 – 2018-05-04 (×2): 0.5 ug via ORAL
  Filled 2018-05-02 (×3): qty 2

## 2018-05-02 MED ORDER — MAGNESIUM OXIDE 400 (241.3 MG) MG PO TABS
800.0000 mg | ORAL_TABLET | Freq: Two times a day (BID) | ORAL | Status: AC
  Administered 2018-05-02 – 2018-05-05 (×7): 800 mg via ORAL
  Filled 2018-05-02 (×8): qty 2

## 2018-05-02 NOTE — Progress Notes (Signed)
[place] for heparin Indication: history of PE  Allergies  Allergen Reactions  . Gadolinium Derivatives Hives and Other (See Comments)    HIVES, Desc: HIVES W/ "DYE" USED FOR 1ST CT SCAN BUT NOT 2ND, NO PREMEDS USED, PT UNCERTAIN OF CIRCUMSTANCES,,?POSSIBLE MRI CONTRAST ALLERGY, ALL STUDIES DONE "SOMEWHERE" IN [place]//A.C., Onset Date: [id number]  . Iohexol Other (See Comments)     Code: HIVES, Desc: HIVES W/ "DYE" USED FOR 1ST CT SCAN BUT NOT 2ND, NO PREMEDS USED, PT UNCERTAIN OF CIRCUMSTANCES,,?POSSIBLE MRI CONTRAST ALLERGY, ALL STUDIES DONE "SOMEWHERE" IN [place]//A.C., Onset Date: [id number]     Patient Measurements: Height: 5' 2.99" (160 cm) Weight: 194 lb 14.2 oz (88.4 kg) IBW/kg (Calculated) : 52.38 Heparin Dosing Weight: 73  Vital Signs: Temp: 98.5 F (36.9 C) (05/04 0755) Temp Source: Oral (05/04 0755) BP: 116/97 (05/04 0755) Pulse Rate: 91 (05/04 0755)  Labs: Recent Labs    04/29/18 1653 04/30/18 0015  05/01/18 0036 05/01/18 0831 05/01/18 1206 05/02/18 0342  HGB  --  6.7*  --  10.0*  --   --  10.1*  HCT  --  19.6*  --  29.1*  --   --  30.8*  PLT  --  212  --  206  --   --  202  LABPROT 15.9*  --   --   --   --   --   --   INR 1.29  --   --   --   --   --   --   HEPARINUNFRC  --   --    < > 0.34 2.12* 0.48 0.41  CREATININE  --  10.01*  10.02*  --  5.71*  --   --  7.83*   < > = values in this interval not displayed.    Estimated Creatinine Clearance: 7.9 mL/min (A) (by C-G formula based on SCr of 7.83 mg/dL (H)).   Medical History: Past Medical History:  Diagnosis Date  . Anemia   . Anxiety   . Arthritis   . CHF (congestive heart failure) ([place])   . CKD (chronic kidney disease)    Stage III 03/2013  . Colon cancer ([place])   . COPD (chronic obstructive pulmonary disease) ([place])   . Coronary artery disease   . Depression   . Hypertension   . Meningitis   . PE (pulmonary embolism)   . Peripheral vascular  disease ([place])   . Shortness of breath   . Sleep apnea   . Vertigo     Assessment: 62 year old female s/p HD access with history of PE. Patient historically on apixaban on hold for HD access (fistula versus graft next week) -heparin level at goal   Goal of Therapy:  Heparin level 0.3-0.7 units/ml Monitor platelets by anticoagulation protocol: Yes   Plan:  Continue heparin at 1000 units/hr Daily heparin level  Hildred Laser, PharmD Clinical Pharmacist Clinical phone from 8:30-4:00 is [phone] After 4pm, please call Main [facility] [phone]) for assistance. 05/02/2018 12:15 PM

## 2018-05-02 NOTE — Plan of Care (Signed)
Care plans reviewed and patient is progressing.  

## 2018-05-02 NOTE — Plan of Care (Signed)
  Problem: Education: Goal: Knowledge of General Education information will improve Outcome: Progressing   

## 2018-05-02 NOTE — Progress Notes (Signed)
PROGRESS NOTE   Whitney Walker  JQZ:[id number]    DOB: July 07, 1956    DOA: 04/29/2018  PCP: Glendale Chard, MD   I have briefly reviewed patients previous medical records in [facility].  Brief Narrative:  62 year old female patient, lives alone, independent, PMH of HTN, CKD (Dr. [name]), DVT/PE on Eliquis, CAD, chronic diastolic CHF, PAD, anemia, tobacco abuse, COPD, anxiety and depression was sent by her PCP to ED on 04/29/2018 due to weakness, decreased appetite, dyspnea, confusion, itching and worsening labs over 2 weeks (creatinine increased from 9-15 and potassium from 5.7-6.2, hemoglobin dropped from 8.8-7).  She was admitted for new ESRD with uremia.  Nephrology was consulted and urgent HD was initiated.   Assessment & Plan:   Active Problems:   Leucocytosis   Morbid (severe) obesity due to excess calories (HCC)   Hyperkalemia   OSA (obstructive sleep apnea)   ESRD (end stage renal disease) ([place])   New ESRD p/w uremia/hyperkalemia/volume overload/metabolic acidosis/uremic encephalopathy: Patient presented with progressive chronic kidney disease and uremic.  Nephrology consulted.  Temporary right IJ HD catheter placed by CCM and HD was started the night of admission.  Vascular surgery changed temporary HD catheter to [facility] 5/3.  Patient was scheduled to have LUE AV fistula versus graft as outpatient and Eliquis had been temporarily held.  Currently transitioned to IV heparin and possibly could get vascular procedure as inpatient on 05/05/2018.  Today is day #3 of HD.  Multiple electrolyte abnormalities (hypokalemia, hypomagnesemia and hyperphosphatemia) discussed with Dr. Jimmy Footman who will address.  Improving.  Hypokalemia/hypomagnesemia: Per nephrology.  On oral magnesium.  Essential hypertension: Blood pressures improved after dialysis.  Amlodipine held.  Anemia in ESRD: Transfused 2 units PRBC on 5/2.  Hemoglobin stable in the 10 g range.  Continue Aranesp.  Leukocytosis: Unclear  etiology.  No clear focus of infection.  Evaluated by Dr. Burr Medico, hematology for chronic leukocytosis.  BCR/ABL Jak 2 mutation negative.  No previous evidence of myeloproliferative disease.  Secondary hyperparathyroidism: Per nephrology.  Continue calcitriol.  Right earache: My colleague Dr. [name] evaluated and discussed with Dr. [name], ENT who recommended Ciprodex 4 drops twice daily for 7 days and outpatient follow-up with him.  Improving.  History of recurrent DVT/PE: Was on Eliquis PTA.  Currently switched to IV heparin drip pending vascular procedure.  OSA: CPAP.  History of Walker cancer status post resection 2009: Outpatient follow-up regarding screening colonoscopy.  Tobacco abuse: Cessation counseled.   DVT prophylaxis: Currently on IV heparin drip Code Status: Full Family Communication: Discussed in detail with patient's daughter at bedside.  Updated care and answered questions. Disposition: DC home possibly mid next week.   Consultants:  CCM Vascular surgery Nephrology  Procedures:  Temporary RIJ HD by CCM on 5/1 Removal of temporary dialysis catheter and placement of tunneled HD 5/3 HD  Antimicrobials:  None   Subjective: Patient interviewed and examined in the presence of her daughter at bedside.  Overall feels much better compared to initial arrival.  Has mild intermittent nausea and an episode of nonbloody emesis this morning but improved compared to admission.  Denies dyspnea or chest pain.  Right earache has resolved.  Reports smoking a pack of cigarettes per day.  Awaiting dialysis today.  Denies any other complaints.  As per RN, no acute issues noted.  ROS: As above.  Objective:  Vitals:   05/01/18 1930 05/02/18 0001 05/02/18 0425 05/02/18 0755  BP: (!) 145/72 131/69 (!) 143/62 (!) 116/97  Pulse: 80 92 83  91  Resp: 17 14 17 20   Temp: 98.4 F (36.9 C) 98.6 F (37 C) 98.1 F (36.7 C) 98.5 F (36.9 C)  TempSrc: Oral Oral Oral Oral  SpO2: 97% 92% 96% 96%   Weight:   88.4 kg (194 lb 14.2 oz)   Height:        Examination:  General exam: Pleasant middle-aged female, moderately built and overweight sitting up comfortably in bed.  Oral mucosa moist. Respiratory system: Clear to auscultation. Respiratory effort normal.  Right TDC +. Cardiovascular system: S1 & S2 heard, RRR. No JVD, murmurs, rubs, gallops or clicks.  Trace bilateral ankle edema.  Telemetry personally reviewed and shows sinus rhythm. Gastrointestinal system: Abdomen is nondistended, soft and nontender. No organomegaly or masses felt. Normal bowel sounds heard. Central nervous system: Alert and oriented. No focal neurological deficits. Extremities: Symmetric 5 x 5 power. Skin: No rashes, lesions or ulcers Psychiatry: Judgement and insight appear normal. Mood & affect appropriate.     Data Reviewed: I have personally reviewed following labs and imaging studies  CBC: Recent Labs  Lab 04/29/18 1334 04/30/18 0015 05/01/18 0036 05/02/18 0342  WBC 16.0* 18.7* 14.0* 21.6*  HGB 7.0* 6.7* 10.0* 10.1*  HCT 21.8* 19.6* 29.1* 30.8*  MCV 78.7 75.7* 77.8* 80.2  PLT 224 212 206 841   Basic Metabolic Panel: Recent Labs  Lab 04/29/18 1334 04/30/18 0015 05/01/18 0036 05/02/18 0342  NA 136 140  140 142 139  K 6.2* 3.0*  3.0* 3.3* 2.9*  CL 112* 109  109 102 102  CO2 <7* 11*  12* 19* 21*  GLUCOSE 79 85  82 115* 105*  BUN 155* 96*  98* 44* 52*  CREATININE 14.82* 10.01*  10.02* 5.71* 7.83*  CALCIUM 7.0* 7.5*  7.5* 7.7* 8.1*  MG  --   --  1.6* 1.6*  PHOS  --  7.1* 4.7* 4.8*   Liver Function Tests: Recent Labs  Lab 04/29/18 1653 04/29/18 2000 04/30/18 0015 05/01/18 0036 05/02/18 0342  AST 9*  --   --   --   --   ALT 11* 10*  --   --   --   ALKPHOS 71  --   --   --   --   BILITOT 0.6  --   --   --   --   PROT 7.0  --   --   --   --   ALBUMIN 2.9*  --  2.8* 2.9* 2.9*   Coagulation Profile: Recent Labs  Lab 04/29/18 1653  INR 1.29   CBG: Recent Labs  Lab  04/30/18 2308  GLUCAP 124*    No results found for this or any previous visit (from the past 240 hour(s)).       Radiology Studies: Dg Chest Port 1 View  Result Date: 04/30/2018 CLINICAL DATA:  Dialysis cath insertion. EXAM: PORTABLE CHEST 1 VIEW COMPARISON:  04/29/2018. FINDINGS: Dialysis catheter noted with its tip over the upper right atrium. Cardiomegaly with normal pulmonary vascularity. Mild left base atelectasis/infiltrate. Small left pleural effusion. No acute bony abnormality. IMPRESSION: Phthisis catheter with its tip over the upper right atrium. No pneumothorax. 2.  Cardiomegaly.  No pulmonary venous congestion. 3. Mild left base atelectasis/infiltrate with small left pleural effusion. Electronically Signed   By: [place]   On: 04/30/2018 12:54        Scheduled Meds: . calcitRIOL  0.5 mcg Oral QODAY  . ciprofloxacin-dexamethasone  4 drop Right EAR BID  . cyclobenzaprine  5 mg Oral Once  . darbepoetin (ARANESP) injection - DIALYSIS  200 mcg Intravenous Q Thu-HD  . fentaNYL (SUBLIMAZE) injection  12.5 mcg Intravenous Once  . magnesium oxide  800 mg Oral BID  . multivitamin  1 tablet Oral QHS  . pramipexole  0.125 mg Oral TID  . sucroferric oxyhydroxide  500 mg Oral TID WC   Continuous Infusions: . sodium chloride    . sodium chloride    . sodium chloride 10 mL/hr at 04/30/18 1040  . sodium chloride    . heparin 1,000 Units/hr (05/01/18 2014)     LOS: 3 days     Vernell Leep, MD, FACP, [facility]. [facility] Pager [phone]  If 7PM-7AM, please contact night-coverage [email or web address] Password [email or web address] 05/02/2018, 11:21 AM

## 2018-05-02 NOTE — Progress Notes (Signed)
Subjective: Interval History: has no complaint, doing some better.  Objective: Vital signs in last 24 hours: Temp:  [98.1 F (36.7 C)-98.6 F (37 C)] 98.5 F (36.9 C) (05/04 0755) Pulse Rate:  [80-92] 91 (05/04 0755) Resp:  [14-20] 20 (05/04 0755) BP: (116-145)/(53-97) 116/97 (05/04 0755) SpO2:  [92 %-97 %] 96 % (05/04 0755) Weight:  [88.4 kg (194 lb 14.2 oz)] 88.4 kg (194 lb 14.2 oz) (05/04 0425) Weight change: -1.7 kg (-3 lb 12 oz)  Intake/Output from previous day: 05/03 0701 - 05/04 0700 In: 629.5 [P.O.:300; I.V.:329.5] Out: -  Intake/Output this shift: No intake/output data recorded.  General appearance: alert, cooperative, no distress and moderately obese Resp: clear to auscultation bilaterally Chest wall: RIJ cath Cardio: S1, S2 normal, S4 present and systolic murmur: systolic ejection 2/6, decrescendo at 2nd left intercostal space GI: obese, pos bs, soft,  Extremities: edema 1+  Lab Results: Recent Labs    05/01/18 0036 05/02/18 0342  WBC 14.0* 21.6*  HGB 10.0* 10.1*  HCT 29.1* 30.8*  PLT 206 202   BMET:  Recent Labs    05/01/18 0036 05/02/18 0342  NA 142 139  K 3.3* 2.9*  CL 102 102  CO2 19* 21*  GLUCOSE 115* 105*  BUN 44* 52*  CREATININE 5.71* 7.83*  CALCIUM 7.7* 8.1*   Recent Labs    04/30/18 0015  PTH 382*   Iron Studies:  Recent Labs    04/30/18 0015  IRON 147  TIBC 151*    Studies/Results: Dg Chest Port 1 View  Result Date: 04/30/2018 CLINICAL DATA:  Dialysis cath insertion. EXAM: PORTABLE CHEST 1 VIEW COMPARISON:  04/29/2018. FINDINGS: Dialysis catheter noted with its tip over the upper right atrium. Cardiomegaly with normal pulmonary vascularity. Mild left base atelectasis/infiltrate. Small left pleural effusion. No acute bony abnormality. IMPRESSION: Phthisis catheter with its tip over the upper right atrium. No pneumothorax. 2.  Cardiomegaly.  No pulmonary venous congestion. 3. Mild left base atelectasis/infiltrate with small left  pleural effusion. Electronically Signed   By: Marcello Moores  Register   On: 04/30/2018 12:54    I have reviewed the patient's current medications.  Assessment/Plan: 1 ESRD 3rd HD today.  Needs perm access 2 Anemia esa/  3 HPTH vit D 4 HTN controlled 5 Obesity 6 low Mg use po P HD, K, Mg , access. counseled   LOS: 3 days   Jeneen Rinks Tylin Force 05/02/2018,8:32 AM

## 2018-05-03 DIAGNOSIS — Z992 Dependence on renal dialysis: Secondary | ICD-10-CM

## 2018-05-03 DIAGNOSIS — L732 Hidradenitis suppurativa: Secondary | ICD-10-CM

## 2018-05-03 DIAGNOSIS — D631 Anemia in chronic kidney disease: Secondary | ICD-10-CM

## 2018-05-03 LAB — RENAL FUNCTION PANEL
Albumin: 3 g/dL — ABNORMAL LOW (ref 3.5–5.0)
Anion gap: 14 (ref 5–15)
BUN: 27 mg/dL — ABNORMAL HIGH (ref 6–20)
CO2: 24 mmol/L (ref 22–32)
Calcium: 8.1 mg/dL — ABNORMAL LOW (ref 8.9–10.3)
Chloride: 97 mmol/L — ABNORMAL LOW (ref 101–111)
Creatinine, Ser: 5.9 mg/dL — ABNORMAL HIGH (ref 0.44–1.00)
GFR calc Af Amer: 8 mL/min — ABNORMAL LOW (ref >60)
GFR calc non Af Amer: 7 mL/min — ABNORMAL LOW (ref >60)
Glucose, Bld: 94 mg/dL (ref 65–99)
Phosphorus: 3.3 mg/dL (ref 2.5–4.6)
Potassium: 2.8 mmol/L — ABNORMAL LOW (ref 3.5–5.1)
Sodium: 135 mmol/L (ref 135–145)

## 2018-05-03 LAB — CBC
HCT: 33 % — ABNORMAL LOW (ref 36.0–46.0)
Hemoglobin: 10.8 g/dL — ABNORMAL LOW (ref 12.0–15.0)
MCH: 26.7 pg (ref 26.0–34.0)
MCHC: 32.7 g/dL (ref 30.0–36.0)
MCV: 81.7 fL (ref 78.0–100.0)
Platelets: 152 10*3/uL (ref 150–400)
RBC: 4.04 MIL/uL (ref 3.87–5.11)
RDW: 15.9 % — ABNORMAL HIGH (ref 11.5–15.5)
WBC: 28 10*3/uL — ABNORMAL HIGH (ref 4.0–10.5)

## 2018-05-03 LAB — MAGNESIUM: Magnesium: 1.7 mg/dL (ref 1.7–2.4)

## 2018-05-03 LAB — HEPARIN LEVEL (UNFRACTIONATED): Heparin Unfractionated: 0.44 IU/mL (ref 0.30–0.70)

## 2018-05-03 MED ORDER — PANTOPRAZOLE SODIUM 40 MG PO TBEC
40.0000 mg | DELAYED_RELEASE_TABLET | Freq: Every day | ORAL | Status: DC
  Administered 2018-05-03 – 2018-05-07 (×5): 40 mg via ORAL
  Filled 2018-05-03 (×5): qty 1

## 2018-05-03 MED ORDER — CHLORHEXIDINE GLUCONATE 4 % EX LIQD
60.0000 mL | Freq: Once | CUTANEOUS | Status: AC
  Administered 2018-05-04: 4 via TOPICAL
  Filled 2018-05-03: qty 60

## 2018-05-03 MED ORDER — ZOLPIDEM TARTRATE 5 MG PO TABS
5.0000 mg | ORAL_TABLET | Freq: Once | ORAL | Status: AC
  Administered 2018-05-03: 5 mg via ORAL
  Filled 2018-05-03: qty 1

## 2018-05-03 MED ORDER — CHLORHEXIDINE GLUCONATE 4 % EX LIQD
60.0000 mL | Freq: Once | CUTANEOUS | Status: AC
  Administered 2018-05-05: 4 via TOPICAL

## 2018-05-03 MED ORDER — DOXYCYCLINE HYCLATE 100 MG PO TABS
100.0000 mg | ORAL_TABLET | Freq: Two times a day (BID) | ORAL | Status: DC
  Administered 2018-05-03 – 2018-05-07 (×9): 100 mg via ORAL
  Filled 2018-05-03 (×9): qty 1

## 2018-05-03 MED ORDER — POTASSIUM CHLORIDE CRYS ER 20 MEQ PO TBCR
40.0000 meq | EXTENDED_RELEASE_TABLET | Freq: Once | ORAL | Status: AC
  Administered 2018-05-03: 40 meq via ORAL
  Filled 2018-05-03: qty 2

## 2018-05-03 MED ORDER — MUPIROCIN CALCIUM 2 % EX CREA
TOPICAL_CREAM | Freq: Two times a day (BID) | CUTANEOUS | Status: DC
  Administered 2018-05-03: 22:00:00 via TOPICAL
  Administered 2018-05-03: 1 via TOPICAL
  Administered 2018-05-04 – 2018-05-06 (×5): via TOPICAL
  Administered 2018-05-07: 1 via TOPICAL
  Filled 2018-05-03: qty 15

## 2018-05-03 MED ORDER — TRAZODONE HCL 50 MG PO TABS
25.0000 mg | ORAL_TABLET | Freq: Every evening | ORAL | Status: DC | PRN
  Administered 2018-05-03 – 2018-05-04 (×2): 25 mg via ORAL
  Filled 2018-05-03 (×2): qty 1

## 2018-05-03 NOTE — Progress Notes (Signed)
[place] for heparin Indication: history of PE  Allergies  Allergen Reactions  . Gadolinium Derivatives Hives and Other (See Comments)    HIVES, Desc: HIVES W/ "DYE" USED FOR 1ST CT SCAN BUT NOT 2ND, NO PREMEDS USED, PT UNCERTAIN OF CIRCUMSTANCES,,?POSSIBLE MRI CONTRAST ALLERGY, ALL STUDIES DONE "SOMEWHERE" IN [place]//A.C., Onset Date: [id number]  . Iohexol Other (See Comments)     Code: HIVES, Desc: HIVES W/ "DYE" USED FOR 1ST CT SCAN BUT NOT 2ND, NO PREMEDS USED, PT UNCERTAIN OF CIRCUMSTANCES,,?POSSIBLE MRI CONTRAST ALLERGY, ALL STUDIES DONE "SOMEWHERE" IN [place]//A.C., Onset Date: [id number]     Patient Measurements: Height: 5' 2.99" (160 cm) Weight: 182 lb 11.2 oz (82.9 kg) IBW/kg (Calculated) : 52.38 Heparin Dosing Weight: 73  Vital Signs: Temp: 98.5 F (36.9 C) (05/05 0744) Temp Source: Oral (05/05 0744) BP: 119/74 (05/05 0744) Pulse Rate: 101 (05/05 0744)  Labs: Recent Labs    05/01/18 0036  05/01/18 1206 05/02/18 0342 05/03/18 0311  HGB 10.0*  --   --  10.1* 10.8*  HCT 29.1*  --   --  30.8* 33.0*  PLT 206  --   --  202 152  HEPARINUNFRC 0.34   < > 0.48 0.41 0.44  CREATININE 5.71*  --   --  7.83* 5.90*   < > = values in this interval not displayed.    Estimated Creatinine Clearance: 10.1 mL/min (A) (by C-G formula based on SCr of 5.9 mg/dL (H)).   Medical History: Past Medical History:  Diagnosis Date  . Anemia   . Anxiety   . Arthritis   . CHF (congestive heart failure) ([place])   . CKD (chronic kidney disease)    Stage III 03/2013  . Colon cancer ([place])   . COPD (chronic obstructive pulmonary disease) ([place])   . Coronary artery disease   . Depression   . Hypertension   . Meningitis   . PE (pulmonary embolism)   . Peripheral vascular disease ([place])   . Shortness of breath   . Sleep apnea   . Vertigo     Assessment: 62 year old female s/p HD access with history of PE. Patient historically on apixaban on  hold for HD access (fistula versus graft next week) on 5/7.  -heparin level at goal   Goal of Therapy:  Heparin level 0.3-0.7 units/ml Monitor platelets by anticoagulation protocol: Yes   Plan:  Continue heparin at 1000 units/hr Daily heparin level  Hildred Laser, PharmD Clinical Pharmacist Clinical phone from 8:30-4:00 is [phone] After 4pm, please call Main Rx [phone]) for assistance. 05/03/2018 11:06 AM

## 2018-05-03 NOTE — Progress Notes (Addendum)
Whitney Walker Progress Note    Assessment/ Plan:   62 yo female with CKD V, morbid obesity, hypertension, congestive heart failure, CAD, COPD, reported history of colon cancer and depression. She has been followed by Dr. [name] for advanced CKD, last seen on 04/14/18, with plans for outpatient fistula placement on 05/01/18. Admitted on 04/29/18 for uremia due to worsening kidney function now doing well after starting HD.  1. CKD with progression to ESRD and uremia. S/p TDC placement and had 3rd session HD yesterday. Next HD on Tuesday. Improved BUN 155>27 and Cr 5.90. VVS to address perm access, possibly planned for Tuesday 5/7. 2. Hypokalemia K2.8, replete with KCl 40 mEq x1. Monitor 3. Secondary hyperparathyroidism. PTH 382. On calcitriol 0.64mcg every other day 4. HTN, controlled. Holding home amlodipine and labetalol as BP improved with HD. 5. Elevated phosphorous improved 4.7>3.3, on velphoro TID AC 6. Anemia due to CKD. Hgb improved from 6.7, s/p 2U RBC on 04/30/18, now stable at 10.8 for the past few days. On aranesp 261mcg weekly. Iron sat 97% on po oral iron as outpatient, stopped to simplify pill burden as can intermittently receive IV iron with HD in the future. 7. Obesity  8. Low mag, repleting with po 800mg  BID. 9. Leukocytosis likely due chronic hidradenitis in perianal region,  Now on doxycycline per primary.  Subjective:   Feels unhappy with hospital stay overall. No acute concerns today.    Objective:   BP 122/80 (BP Location: Right Arm)   Pulse 95   Temp 98 F (36.7 C) (Oral)   Resp 20   Ht 5' 2.99" (1.6 m)   Wt 182 lb 11.2 oz (82.9 kg)   SpO2 96%   BMI 32.37 kg/m   Intake/Output Summary (Last 24 hours) at 05/03/2018 0744 Last data filed at 05/02/2018 2300 Gross per 24 hour  Intake 462 ml  Output 1321 ml  Net -859 ml   Weight change: -6 lb 6.3 oz (-2.9 kg)  Physical Exam: Gen: sitting up on side of bed, in NAD. Nontoxic in appearance. CVS: regular, S1 and  S2 normal, grade II systolic murmur.  Resp: CTAB with good air movement. Normal effort on room air Abd: obese, soft, nontender, nondistended, no hepatomegaly Ext: no edema RIJ PC  Imaging: No results found.  Labs: BMET Recent Labs  Lab 04/29/18 1334 04/30/18 0015 05/01/18 0036 05/02/18 0342 05/03/18 0311  NA 136 140  140 142 139 135  K 6.2* 3.0*  3.0* 3.3* 2.9* 2.8*  CL 112* 109  109 102 102 97*  CO2 <7* 11*  12* 19* 21* 24  GLUCOSE 79 85  82 115* 105* 94  BUN 155* 96*  98* 44* 52* 27*  CREATININE 14.82* 10.01*  10.02* 5.71* 7.83* 5.90*  CALCIUM 7.0* 7.5*  7.5* 7.7* 8.1* 8.1*  PHOS  --  7.1* 4.7* 4.8* 3.3   CBC Recent Labs  Lab 04/30/18 0015 05/01/18 0036 05/02/18 0342 05/03/18 0311  WBC 18.7* 14.0* 21.6* 28.0*  HGB 6.7* 10.0* 10.1* 10.8*  HCT 19.6* 29.1* 30.8* 33.0*  MCV 75.7* 77.8* 80.2 81.7  PLT 212 206 202 152    Medications:    . calcitRIOL  0.5 mcg Oral QODAY  . ciprofloxacin-dexamethasone  4 drop Right EAR BID  . cyclobenzaprine  5 mg Oral Once  . darbepoetin (ARANESP) injection - DIALYSIS  200 mcg Intravenous Q Thu-HD  . magnesium oxide  800 mg Oral BID  . multivitamin  1 tablet Oral QHS  .  pramipexole  0.125 mg Oral TID  . sucroferric oxyhydroxide  500 mg Oral TID WC      Bufford Lope, DO PGY-2, [facility] 05/03/2018 7:44 AM   I have seen and examined this patient and agree with the plan of care seen, examined ,eval, counseled patient. Discussed with resident and primary MD. .  Jeneen Rinks Milka Windholz 05/03/2018, 1:33 PM

## 2018-05-03 NOTE — Plan of Care (Signed)
Care plans reviewed and patient is progressing.  

## 2018-05-03 NOTE — Progress Notes (Signed)
PROGRESS NOTE   Whitney Walker  IHK:[id number]    DOB: Nov 19, 1956    DOA: 04/29/2018  PCP: [place]end[place] Chard, MD   I have briefly reviewed patients previous medical records in [facility].  Brief Narrative:  62 year old female patient, lives alone, independent, PMH of HTN, CKD (Dr. [name]), DVT/PE on Eliquis, CAD, chronic diastolic CHF, PAD, anemia, tobacco abuse, COPD, anxiety and depression was sent by her PCP to ED on 04/29/2018 due to weakness, decreased appetite, dyspnea, confusion, itching and worsening labs over 2 weeks (creatinine increased from 9-15 and potassium from 5.7-6.2, hemoglobin dropped from 8.8-7).  She was admitted for new ESRD with uremia.  Nephrology was consulted and urgent HD was initiated.   Assessment & Plan:   Active Problems:   Leucocytosis   Morbid (severe) obesity due to excess calories (HCC)   Hyperkalemia   OSA (obstructive sleep apnea)   ESRD (end stage renal disease) ([place])   New ESRD p/w uremia/hyperkalemia/volume overload/metabolic acidosis/uremic encephalopathy: Patient presented with progressive chronic kidney disease and uremic.  Nephrology consulted.  Temporary right IJ HD catheter placed by CCM and HD was started the night of admission.  Vascular surgery changed temporary HD catheter to [facility] 5/3.  Patient was scheduled to have LUE AV fistula versus graft as outpatient and Eliquis had been temporarily held.  Currently transitioned to IV heparin and possibly could get vascular procedure as inpatient on 05/05/2018.  Completed day #3 of HD on 5/4.  Multiple electrolyte abnormalities (hypokalemia.  Hypokalemia and hypophosphatemia have improved) >to be addressed by nephrology.  Patient reports cramps likely related to new dialysis >per nephrology. CLIP process ongoing.  Hypokalemia/hypomagnesemia: Magnesium 1.7.  Potassium still low at 2.8 >Per nephrology.  On oral magnesium.  Essential hypertension: Blood pressures improved after dialysis.  Amlodipine held.   Stable.  Anemia in ESRD: Transfused 2 units PRBC on 5/2.  Hemoglobin stable in the 10 g range.  Continue Aranesp.  Stable.  Leukocytosis: Unclear etiology.  No clear focus of infection.  Evaluated by Dr. Burr Medico, hematology for chronic leukocytosis.  BCR/ABL Jak 2 mutation negative.  No previous evidence of myeloproliferative disease.  WBC has gradually increased to 28,?  Stress response,?Hidradinitis. Follow.  Secondary hyperparathyroidism: Per nephrology.  Continue calcitriol.  Right earache: My colleague Dr. [name] evaluated and discussed with Dr. [name], ENT who recommended Ciprodex 4 drops twice daily for 7 days and outpatient follow-up with him.  Resolved.  History of recurrent DVT/PE: Was on Eliquis PTA.  Currently switched to IV heparin drip pending vascular procedure.  OSA: CPAP.  History of colon cancer status post resection 2009: Outpatient follow-up regarding screening colonoscopy.  Tobacco abuse: Cessation counseled.  Suspected Hidradenitis: of pubic area.  Doxycycline course for 5-7 days.   DVT prophylaxis: Currently on IV heparin drip Code Status: Full Family Communication: None at bedside.   Disposition: DC home possibly mid next week.   Consultants:  CCM Vascular surgery Nephrology  Procedures:  Temporary RIJ HD by CCM on 5/1 Removal of temporary dialysis catheter and placement of tunneled HD 5/3 HD  Antimicrobials:  None   Subjective: Reports generalized cramps.  Ongoing nausea and intermittent dry heaves, early satiety.  No true dysphagia.  Patient and RN also reported "pus bumps" in her pubic area.  1 of them has ruptured.  Patient gives history of recurrent such lesions.  ROS: As above.  Objective:  Vitals:   05/02/18 2018 05/03/18 0010 05/03/18 0430 05/03/18 0744  BP: 126/72 (!) 119/59 122/80 119/74  Pulse: 87 (!) 105 95 (!) 101  Resp: 19 20 20 12   Temp: 98.3 F (36.8 C) 98.4 F (36.9 C) 98 F (36.7 C) 98.5 F (36.9 C)  TempSrc: Oral Oral Oral  Oral  SpO2: 97% 95% 96% 95%  Weight:   82.9 kg (182 lb 11.2 oz)   Height:        Examination:  General exam: Pleasant middle-aged female, moderately built and overweight sitting up comfortably in a chair.  Oral mucosa moist. Respiratory system: Clear to auscultation. Respiratory effort normal.  Right TDC +. Cardiovascular system: S1 & S2 heard, RRR. No JVD, murmurs, rubs, gallops or clicks.  No ankle edema.  Telemetry personally reviewed and shows sinus rhythm. Gastrointestinal system: Abdomen is nondistended, soft and nontender. No organomegaly or masses felt. Normal bowel sounds heard. Central nervous system: Alert and oriented. No focal neurological deficits. Extremities: Symmetric 5 x 5 power. Skin: Patient was examined along with her female RN in room.  Ruptured folliculitis/superficial abscess over R side of mons pubis. Indurated ~2 x 1 cm area with mild this but no fluctuance at the left groin fold area. Psychiatry: Judgement and insight appear normal. Mood & affect appropriate.     Data Reviewed: I have personally reviewed following labs and imaging studies  CBC: Recent Labs  Lab 04/29/18 1334 04/30/18 0015 05/01/18 0036 05/02/18 0342 05/03/18 0311  WBC 16.0* 18.7* 14.0* 21.6* 28.0*  HGB 7.0* 6.7* 10.0* 10.1* 10.8*  HCT 21.8* 19.6* 29.1* 30.8* 33.0*  MCV 78.7 75.7* 77.8* 80.2 81.7  PLT 224 212 206 202 656   Basic Metabolic Panel: Recent Labs  Lab 04/29/18 1334 04/30/18 0015 05/01/18 0036 05/02/18 0342 05/03/18 0311  NA 136 140  140 142 139 135  K 6.2* 3.0*  3.0* 3.3* 2.9* 2.8*  CL 112* 109  109 102 102 97*  CO2 <7* 11*  12* 19* 21* 24  GLUCOSE 79 85  82 115* 105* 94  BUN 155* 96*  98* 44* 52* 27*  CREATININE 14.82* 10.01*  10.02* 5.71* 7.83* 5.90*  CALCIUM 7.0* 7.5*  7.5* 7.7* 8.1* 8.1*  MG  --   --  1.6* 1.6* 1.7  PHOS  --  7.1* 4.7* 4.8* 3.3   Liver Function Tests: Recent Labs  Lab 04/29/18 1653 04/29/18 2000 04/30/18 0015 05/01/18 0036  05/02/18 0342 05/03/18 0311  AST 9*  --   --   --   --   --   ALT 11* 10*  --   --   --   --   ALKPHOS 71  --   --   --   --   --   BILITOT 0.6  --   --   --   --   --   PROT 7.0  --   --   --   --   --   ALBUMIN 2.9*  --  2.8* 2.9* 2.9* 3.0*   Coagulation Profile: Recent Labs  Lab 04/29/18 1653  INR 1.29   CBG: Recent Labs  Lab 04/30/18 2308  GLUCAP 124*    No results found for this or any previous visit (from the past 240 hour(s)).       Radiology Studies: No results found.      Scheduled Meds: . calcitRIOL  0.5 mcg Oral QODAY  . ciprofloxacin-dexamethasone  4 drop Right EAR BID  . cyclobenzaprine  5 mg Oral Once  . darbepoetin (ARANESP) injection - DIALYSIS  200 mcg Intravenous Q Thu-HD  .  magnesium oxide  800 mg Oral BID  . multivitamin  1 tablet Oral QHS  . potassium chloride  40 mEq Oral Once  . pramipexole  0.125 mg Oral TID  . sucroferric oxyhydroxide  500 mg Oral TID WC   Continuous Infusions: . sodium chloride 10 mL/hr at 04/30/18 1040  . sodium chloride    . heparin 1,000 Units/hr (05/01/18 2014)     LOS: 4 days     Vernell Leep, MD, FACP, [facility]. [facility] Pager [phone]  If 7PM-7AM, please contact night-coverage [email or web address] Password [email or web address] 05/03/2018, 8:55 AM

## 2018-05-03 NOTE — Plan of Care (Signed)
  Problem: Education: Goal: Knowledge of General Education information will improve Outcome: Progressing   

## 2018-05-03 NOTE — Progress Notes (Signed)
The daughter of Whitney Walker, [place], would like to have a daily phone call from the physician's in order to know what is going on with her mother.  Jonita Albee can be reached at [phone].  Thank You.

## 2018-05-04 LAB — HEPARIN LEVEL (UNFRACTIONATED)
Heparin Unfractionated: <0.1 IU/mL — ABNORMAL LOW (ref 0.30–0.70)
Heparin Unfractionated: 0.22 IU/mL — ABNORMAL LOW (ref 0.30–0.70)
Heparin Unfractionated: 0.51 IU/mL (ref 0.30–0.70)

## 2018-05-04 LAB — CBC
HCT: 34.1 % — ABNORMAL LOW (ref 36.0–46.0)
Hemoglobin: 10.8 g/dL — ABNORMAL LOW (ref 12.0–15.0)
MCH: 26.6 pg (ref 26.0–34.0)
MCHC: 31.7 g/dL (ref 30.0–36.0)
MCV: 84 fL (ref 78.0–100.0)
Platelets: 153 10*3/uL (ref 150–400)
RBC: 4.06 MIL/uL (ref 3.87–5.11)
RDW: 16 % — ABNORMAL HIGH (ref 11.5–15.5)
WBC: 23.6 10*3/uL — ABNORMAL HIGH (ref 4.0–10.5)

## 2018-05-04 LAB — RENAL FUNCTION PANEL
Albumin: 2.9 g/dL — ABNORMAL LOW (ref 3.5–5.0)
Anion gap: 13 (ref 5–15)
BUN: 32 mg/dL — ABNORMAL HIGH (ref 6–20)
CO2: 21 mmol/L — ABNORMAL LOW (ref 22–32)
Calcium: 8.7 mg/dL — ABNORMAL LOW (ref 8.9–10.3)
Chloride: 100 mmol/L — ABNORMAL LOW (ref 101–111)
Creatinine, Ser: 7.85 mg/dL — ABNORMAL HIGH (ref 0.44–1.00)
GFR calc Af Amer: 6 mL/min — ABNORMAL LOW (ref >60)
GFR calc non Af Amer: 5 mL/min — ABNORMAL LOW (ref >60)
Glucose, Bld: 140 mg/dL — ABNORMAL HIGH (ref 65–99)
Phosphorus: 3.2 mg/dL (ref 2.5–4.6)
Potassium: 3.2 mmol/L — ABNORMAL LOW (ref 3.5–5.1)
Sodium: 134 mmol/L — ABNORMAL LOW (ref 135–145)

## 2018-05-04 LAB — MAGNESIUM: Magnesium: 1.7 mg/dL (ref 1.7–2.4)

## 2018-05-04 LAB — GLUCOSE, CAPILLARY: Glucose-Capillary: 121 mg/dL — ABNORMAL HIGH (ref 65–99)

## 2018-05-04 MED ORDER — POTASSIUM CHLORIDE CRYS ER 20 MEQ PO TBCR
20.0000 meq | EXTENDED_RELEASE_TABLET | Freq: Once | ORAL | Status: AC
  Administered 2018-05-04: 20 meq via ORAL
  Filled 2018-05-04: qty 1

## 2018-05-04 MED ORDER — ZOLPIDEM TARTRATE 5 MG PO TABS
5.0000 mg | ORAL_TABLET | Freq: Once | ORAL | Status: AC
  Administered 2018-05-04: 5 mg via ORAL
  Filled 2018-05-04: qty 1

## 2018-05-04 MED ORDER — CALCITRIOL 0.25 MCG PO CAPS
0.5000 ug | ORAL_CAPSULE | ORAL | Status: DC
  Administered 2018-05-07: 0.5 ug via ORAL
  Filled 2018-05-04: qty 2

## 2018-05-04 MED ORDER — CEFAZOLIN SODIUM-DEXTROSE 2-4 GM/100ML-% IV SOLN
2.0000 g | INTRAVENOUS | Status: AC
  Administered 2018-05-05: 2 g via INTRAVENOUS
  Filled 2018-05-04: qty 100

## 2018-05-04 NOTE — Progress Notes (Signed)
   Plan is for left arm AV fistula versus graft tomorrow.  She can continue heparin up until time of on-call to the OR.  She is agreeable to proceed at this time.  Annora Guderian C. Donzetta Matters, MD [facility] Office: [phone] Pager: [phone]

## 2018-05-04 NOTE — Progress Notes (Signed)
ANTICOAGULATION CONSULT NOTE   Pharmacy Consult for Heparin Indication: history of PE  Patient Measurements: Height: 5' 2.99" (160 cm) Weight: 183 lb 14.4 oz (83.4 kg) IBW/kg (Calculated) : 52.38 Heparin Dosing Weight: 73  Vital Signs: Temp: 98.3 F (36.8 C) (05/06 0329) Temp Source: Oral (05/06 0329) BP: 119/83 (05/06 0329) Pulse Rate: 47 (05/06 0329)  Labs: Recent Labs    05/02/18 0342 05/03/18 0311 05/04/18 0303  HGB 10.1* 10.8* 10.8*  HCT 30.8* 33.0* 34.1*  PLT 202 152 153  HEPARINUNFRC 0.41 0.44 <0.10*  CREATININE 7.83* 5.90* 7.85*    Estimated Creatinine Clearance: 7.6 mL/min (A) (by C-G formula based on SCr of 7.85 mg/dL (H)).   Medical History: Past Medical History:  Diagnosis Date  . Anemia   . Anxiety   . Arthritis   . CHF (congestive heart failure) ([place])   . CKD (chronic kidney disease)    Stage III 03/2013  . Colon cancer ([place])   . COPD (chronic obstructive pulmonary disease) ([place])   . Coronary artery disease   . Depression   . Hypertension   . Meningitis   . PE (pulmonary embolism)   . Peripheral vascular disease ([place])   . Shortness of breath   . Sleep apnea   . Vertigo     Assessment: 62 year old female s/p HD access with history of PE. Patient historically on apixaban on hold for HD access (fistula versus graft next week) on 5/7.  -heparin level low this AM -no issues per RN  Goal of Therapy:  Heparin level 0.3-0.7 units/ml Monitor platelets by anticoagulation protocol: Yes   Plan:  Inc heparin to 1150 units/hr [place], PharmD, BCPS Clinical Pharmacist Phone: [phone]

## 2018-05-04 NOTE — Progress Notes (Signed)
PROGRESS NOTE   Whitney Walker  IWL:[id number]    DOB: 1956/04/15    DOA: 04/29/2018  PCP: Glendale Chard, MD   I have briefly reviewed patients previous medical records in [facility].  Brief Narrative:  62 year old female patient, lives alone, independent, PMH of HTN, CKD (Dr. [name]), DVT/PE on Eliquis, CAD, chronic diastolic CHF, PAD, anemia, tobacco abuse, COPD, anxiety and depression was sent by her PCP to ED on 04/29/2018 due to weakness, decreased appetite, dyspnea, confusion, itching and worsening labs over 2 weeks (creatinine increased from 9-15 and potassium from 5.7-6.2, hemoglobin dropped from 8.8-7).  She was admitted for new ESRD with uremia.  Nephrology was consulted and urgent HD was initiated.   Assessment & Plan:   Active Problems:   Leucocytosis   Morbid (severe) obesity due to excess calories (HCC)   Hyperkalemia   OSA (obstructive sleep apnea)   ESRD (end stage renal disease) ([place])   New ESRD p/w uremia/hyperkalemia/volume overload/metabolic acidosis/uremic encephalopathy: Patient presented with progressive chronic kidney disease and uremic.  Nephrology consulted.  Temporary right IJ HD catheter placed by CCM and HD was started the night of admission.  Vascular surgery changed temporary HD catheter to [facility] 5/3.  Patient was scheduled to have LUE AV fistula versus graft as outpatient and Eliquis had been temporarily held.  Currently transitioned to IV heparin and possibly could get vascular procedure as inpatient on 05/05/2018.  Completed day #3 of HD on 5/4.  Multiple electrolyte abnormalities (hypokalemia.  Hypokalemia and hypophosphatemia have improved) >to be addressed by nephrology.  Patient reports cramps likely related to new dialysis >per nephrology. CLIP process ongoing. Improving. Discussed with Dr. [name] re cramps which are better and to replace Hypokalemia.  Hypokalemia/hypomagnesemia: Magnesium 1.7.  Potassium still low at 3.2 >Per nephrology.  On oral  magnesium.  Essential hypertension: Blood pressures improved after dialysis.  Amlodipine held.  Stable.  Anemia in ESRD: Transfused 2 units PRBC on 5/2.  Hemoglobin stable in the 10 g range.  Continue Aranesp.  Stable  Leukocytosis: Unclear etiology.  No clear focus of infection.  Evaluated by Dr. Burr Medico, hematology for chronic leukocytosis.  BCR/ABL Jak 2 mutation negative.  No previous evidence of myeloproliferative disease.  WBC better today at 24,?  Stress response,?Hidradinitis. Follow.  Secondary hyperparathyroidism: Per nephrology.  Continue calcitriol.  Right earache: My colleague Dr. [name] evaluated and discussed with Dr. [name], ENT who recommended Ciprodex 4 drops twice daily for 7 days and outpatient follow-up with him.  Resolved.  History of recurrent DVT/PE: Was on Eliquis PTA.  Currently switched to IV heparin drip pending vascular procedure. May need to go on Warfarin after that.  OSA: CPAP.  History of colon cancer status post resection 2009: Outpatient follow-up regarding screening colonoscopy.  Tobacco abuse: Cessation counseled.  Suspected Hidradenitis: of pubic area.  Doxycycline course for 5-7 days, started 5/5.   DVT prophylaxis: Currently on IV heparin drip Code Status: Full Family Communication: d/w daughter at bedside.   Disposition: DC home possibly mid next week.   Consultants:  CCM Vascular surgery Nephrology  Procedures:  Temporary RIJ HD by CCM on 5/1 Removal of temporary dialysis catheter and placement of tunneled HD 5/3 HD  Antimicrobials:  None   Subjective: Feels stronger, wants to get up and walk. Abd fullness better. No nausea. Specifically asked and she denies dysphagia. Leg cramps better. Per daughter at bedside, confusion better but still there.  ROS: As above.  Objective:  Vitals:   05/03/18 0744 05/03/18  1400 05/03/18 1948 05/04/18 0329  BP: 119/74 115/74 115/75 119/83  Pulse: (!) 101  91 (!) 47  Resp: 12 20 14  (!) 24  Temp:  98.5 F (36.9 C)  98.6 F (37 C) 98.3 F (36.8 C)  TempSrc: Oral  Oral Oral  SpO2: 95% 100% 97% 97%  Weight:    83.4 kg (183 lb 14.4 oz)  Height:        Examination:  General exam: Pleasant middle-aged female, moderately built and overweight sitting up comfortably in a chair.  Oral mucosa moist. Respiratory system: Clear to auscultation. Respiratory effort normal.  Right TDC +. Stable. Cardiovascular system: S1 & S2 heard, RRR. No JVD, murmurs, rubs, gallops or clicks.  No ankle edema.  Telemetry personally reviewed and shows sinus rhythm. Stable. Gastrointestinal system: Abdomen is nondistended, soft and nontender. No organomegaly or masses felt. Normal bowel sounds heard. Stable. Central nervous system: Alert and oriented. No focal neurological deficits. Stable. Extremities: Symmetric 5 x 5 power. Skin: Patient was examined along with her female RN in room on 5/5.  Ruptured folliculitis/superficial abscess over R side of mons pubis. Indurated ~2 x 1 cm area with mild this but no fluctuance at the left groin fold area. Psychiatry: Judgement and insight appear normal. Mood & affect appropriate.     Data Reviewed: I have personally reviewed following labs and imaging studies  CBC: Recent Labs  Lab 04/30/18 0015 05/01/18 0036 05/02/18 0342 05/03/18 0311 05/04/18 0303  WBC 18.7* 14.0* 21.6* 28.0* 23.6*  HGB 6.7* 10.0* 10.1* 10.8* 10.8*  HCT 19.6* 29.1* 30.8* 33.0* 34.1*  MCV 75.7* 77.8* 80.2 81.7 84.0  PLT 212 206 202 152 546   Basic Metabolic Panel: Recent Labs  Lab 04/30/18 0015 05/01/18 0036 05/02/18 0342 05/03/18 0311 05/04/18 0303  NA 140  140 142 139 135 134*  K 3.0*  3.0* 3.3* 2.9* 2.8* 3.2*  CL 109  109 102 102 97* 100*  CO2 11*  12* 19* 21* 24 21*  GLUCOSE 85  82 115* 105* 94 140*  BUN 96*  98* 44* 52* 27* 32*  CREATININE 10.01*  10.02* 5.71* 7.83* 5.90* 7.85*  CALCIUM 7.5*  7.5* 7.7* 8.1* 8.1* 8.7*  MG  --  1.6* 1.6* 1.7 1.7  PHOS 7.1* 4.7* 4.8*  3.3 3.2   Liver Function Tests: Recent Labs  Lab 04/29/18 1653 04/29/18 2000 04/30/18 0015 05/01/18 0036 05/02/18 0342 05/03/18 0311 05/04/18 0303  AST 9*  --   --   --   --   --   --   ALT 11* 10*  --   --   --   --   --   ALKPHOS 71  --   --   --   --   --   --   BILITOT 0.6  --   --   --   --   --   --   PROT 7.0  --   --   --   --   --   --   ALBUMIN 2.9*  --  2.8* 2.9* 2.9* 3.0* 2.9*   Coagulation Profile: Recent Labs  Lab 04/29/18 1653  INR 1.29   CBG: Recent Labs  Lab 04/30/18 2308  GLUCAP 124*    No results found for this or any previous visit (from the past 240 hour(s)).       Radiology Studies: No results found.      Scheduled Meds: . calcitRIOL  0.5 mcg Oral QODAY  .  chlorhexidine  60 mL Topical Once   And  . [START ON 05/05/2018] chlorhexidine  60 mL Topical Once  . ciprofloxacin-dexamethasone  4 drop Right EAR BID  . darbepoetin (ARANESP) injection - DIALYSIS  200 mcg Intravenous Q Thu-HD  . doxycycline  100 mg Oral BID  . magnesium oxide  800 mg Oral BID  . multivitamin  1 tablet Oral QHS  . mupirocin cream   Topical BID  . pantoprazole  40 mg Oral Daily  . pramipexole  0.125 mg Oral TID  . sucroferric oxyhydroxide  500 mg Oral TID WC   Continuous Infusions: . [START ON 05/05/2018]  ceFAZolin (ANCEF) IV    . heparin 1,150 Units/hr (05/04/18 0502)     LOS: 5 days     Vernell Leep, MD, FACP, [facility]. [facility] Pager [phone]  If 7PM-7AM, please contact night-coverage [email or web address] Password [facility] 05/04/2018, 8:54 AM

## 2018-05-04 NOTE — Progress Notes (Signed)
ANTICOAGULATION CONSULT NOTE   Pharmacy Consult for Heparin Indication: history of PE  Patient Measurements: Height: 5' 2.99" (160 cm) Weight: 183 lb 14.4 oz (83.4 kg) IBW/kg (Calculated) : 52.38 Heparin Dosing Weight: 73  Vital Signs: Temp: 98.3 F (36.8 C) (05/06 2012) Temp Source: Oral (05/06 2012) BP: 117/87 (05/06 1620) Pulse Rate: 102 (05/06 2012)  Labs: Recent Labs    05/02/18 0342 05/03/18 0311 05/04/18 0303 05/04/18 1149 05/04/18 2137  HGB 10.1* 10.8* 10.8*  --   --   HCT 30.8* 33.0* 34.1*  --   --   PLT 202 152 153  --   --   HEPARINUNFRC 0.41 0.44 <0.10* 0.22* 0.51  CREATININE 7.83* 5.90* 7.85*  --   --     Estimated Creatinine Clearance: 7.6 mL/min (A) (by C-G formula based on SCr of 7.85 mg/dL (H)).   Medical History: Past Medical History:  Diagnosis Date  . Anemia   . Anxiety   . Arthritis   . CHF (congestive heart failure) ([place])   . CKD (chronic kidney disease)    Stage III 03/2013  . Colon cancer ([place])   . COPD (chronic obstructive pulmonary disease) ([place])   . Coronary artery disease   . Depression   . Hypertension   . Meningitis   . PE (pulmonary embolism)   . Peripheral vascular disease ([place])   . Shortness of breath   . Sleep apnea   . Vertigo     Assessment: 61 year old female s/p HD access with history of PE. Patient historically on apixaban on hold for HD access (fistula versus graft next week) on 5/7.  -heparin level therapeutic x 1 after rate increase -heparin off on call to surgery 5/7  Goal of Therapy:  Heparin level 0.3-0.7 units/ml Monitor platelets by anticoagulation protocol: Yes   Plan:  Cont heparin at 1350 units/hr Heparin level with AM labs  Narda Bonds, PharmD, [place] Pharmacist Phone: [phone]

## 2018-05-04 NOTE — Progress Notes (Signed)
[facility] Progress Note    Assessment/ Plan:   62 yo female with CKD V, morbid obesity, hypertension, congestive heart failure, CAD, COPD, reported history of colon cancer and depression. She has been followed by Dr. [name] for advanced CKD, last seen on 04/14/18, with plans for outpatient fistula placement on 05/01/18. Admitted on 04/29/18 for uremia due to worsening kidney function now doing well after starting HD.  1. ESRD s/p TDC placement. Next HD on Tuesday. VVS to address perm access, possibly planned for Tuesday 5/7. 2. Hypokalemia, improved. K 3.2. Replete with KCl 20 mEq po x1 Monitor 3. Secondary hyperparathyroidism. PTH 382. On calcitriol 0.66mcg every other day 4. HTN, controlled. Holding home amlodipine and labetalol as BP improved with HD. 5. Elevated phosphorous, on velphoro TID AC 6. Anemia due to CKD. s/p 2U RBC on 04/30/18, now Hgb stable at 10.8. On aranesp 22mcg weekly. Iron sat 97% on po oral iron as outpatient, stopped to simplify pill burden as can intermittently receive IV iron with HD in the future. 7. Obesity  8. Low mag, repleting with po 800mg  BID. 9. Hidradenitis, on doxycycline per primary.  Subjective:   States had very bad cramping in her legs and hands yesterday which is now starting to ease off. No CP, SOB, n/v.   Objective:   BP 119/83 (BP Location: Right Arm)   Pulse (!) 47   Temp 98.3 F (36.8 C) (Oral)   Resp (!) 24   Ht 5' 2.99" (1.6 m)   Wt 183 lb 14.4 oz (83.4 kg)   SpO2 97%   BMI 32.58 kg/m   Intake/Output Summary (Last 24 hours) at 05/04/2018 1049 Last data filed at 05/04/2018 0700 Gross per 24 hour  Intake 600 ml  Output -  Net 600 ml   Weight change: -4 lb 9.5 oz (-2.083 kg)  Physical Exam: Gen: laying bed, in NAD. R TDC in place CVS: regular, S1 and S2 normal, grade II systolic murmur.  Resp: CTAB with good air movement. Normal effort on room air Abd: obese, soft, nontender, nondistended, no hepatomegaly Ext: no  edema  Imaging: No results found.  Labs: BMET Recent Labs  Lab 04/29/18 1334 04/30/18 0015 05/01/18 0036 05/02/18 0342 05/03/18 0311 05/04/18 0303  NA 136 140  140 142 139 135 134*  K 6.2* 3.0*  3.0* 3.3* 2.9* 2.8* 3.2*  CL 112* 109  109 102 102 97* 100*  CO2 <7* 11*  12* 19* 21* 24 21*  GLUCOSE 79 85  82 115* 105* 94 140*  BUN 155* 96*  98* 44* 52* 27* 32*  CREATININE 14.82* 10.01*  10.02* 5.71* 7.83* 5.90* 7.85*  CALCIUM 7.0* 7.5*  7.5* 7.7* 8.1* 8.1* 8.7*  PHOS  --  7.1* 4.7* 4.8* 3.3 3.2   CBC Recent Labs  Lab 05/01/18 0036 05/02/18 0342 05/03/18 0311 05/04/18 0303  WBC 14.0* 21.6* 28.0* 23.6*  HGB 10.0* 10.1* 10.8* 10.8*  HCT 29.1* 30.8* 33.0* 34.1*  MCV 77.8* 80.2 81.7 84.0  PLT 206 202 152 153    Medications:    . [START ON 05/05/2018] calcitRIOL  0.5 mcg Oral QODAY  . chlorhexidine  60 mL Topical Once   And  . [START ON 05/05/2018] chlorhexidine  60 mL Topical Once  . ciprofloxacin-dexamethasone  4 drop Right EAR BID  . darbepoetin (ARANESP) injection - DIALYSIS  200 mcg Intravenous Q Thu-HD  . doxycycline  100 mg Oral BID  . magnesium oxide  800 mg Oral BID  .  multivitamin  1 tablet Oral QHS  . mupirocin cream   Topical BID  . pantoprazole  40 mg Oral Daily  . pramipexole  0.125 mg Oral TID  . sucroferric oxyhydroxide  500 mg Oral TID WC      Bufford Lope, DO PGY-2, [place] [facility] 05/04/2018 10:49 AM

## 2018-05-04 NOTE — Progress Notes (Signed)
ANTICOAGULATION CONSULT NOTE   Pharmacy Consult for Heparin Indication: history of PE  Patient Measurements: Height: 5' 2.99" (160 cm) Weight: 183 lb 14.4 oz (83.4 kg) IBW/kg (Calculated) : 52.38 Heparin Dosing Weight: 73  Vital Signs: Temp: 98.7 F (37.1 C) (05/06 1316) Temp Source: Oral (05/06 1316) BP: 147/77 (05/06 1316) Pulse Rate: 98 (05/06 1316)  Labs: Recent Labs    05/02/18 0342 05/03/18 0311 05/04/18 0303 05/04/18 1149  HGB 10.1* 10.8* 10.8*  --   HCT 30.8* 33.0* 34.1*  --   PLT 202 152 153  --   HEPARINUNFRC 0.41 0.44 <0.10* 0.22*  CREATININE 7.83* 5.90* 7.85*  --     Estimated Creatinine Clearance: 7.6 mL/min (A) (by C-G formula based on SCr of 7.85 mg/dL (H)).   Medical History: Past Medical History:  Diagnosis Date  . Anemia   . Anxiety   . Arthritis   . CHF (congestive heart failure) ([place])   . CKD (chronic kidney disease)    Stage III 03/2013  . Colon cancer ([place])   . COPD (chronic obstructive pulmonary disease) ([place])   . Coronary artery disease   . Depression   . Hypertension   . Meningitis   . PE (pulmonary embolism)   . Peripheral vascular disease ([place])   . Shortness of breath   . Sleep apnea   . Vertigo     Assessment: 62 year old female s/p HD access with history of PE. Patient historically on apixaban on hold for HD access (fistula versus graft next week) on 5/7.  -heparin level low this PM -no issues per RN  Goal of Therapy:  Heparin level 0.3-0.7 units/ml Monitor platelets by anticoagulation protocol: Yes   Plan:  Inc heparin to 1350 units/hr 2200 HL  Thank you  Anette Guarneri, PharmD [phone]

## 2018-05-05 ENCOUNTER — Encounter (HOSPITAL_COMMUNITY): Payer: Self-pay | Admitting: Surgery

## 2018-05-05 ENCOUNTER — Inpatient Hospital Stay (HOSPITAL_COMMUNITY): Payer: Medicare Other | Admitting: Certified Registered"

## 2018-05-05 ENCOUNTER — Encounter (HOSPITAL_COMMUNITY)
Admission: EM | Disposition: A | Discharge status: Home / Self Care | Payer: Self-pay | Source: Home / Self Care | Attending: Internal Medicine

## 2018-05-05 ENCOUNTER — Ambulatory Visit (HOSPITAL_COMMUNITY): Admission: RE | Admit: 2018-05-05 | Payer: Medicare Other | Source: Ambulatory Visit | Admitting: Vascular Surgery

## 2018-05-05 HISTORY — PX: AV FISTULA PLACEMENT: SHX1204

## 2018-05-05 LAB — RENAL FUNCTION PANEL
Albumin: 2.8 g/dL — ABNORMAL LOW (ref 3.5–5.0)
Albumin: 2.7 g/dL — ABNORMAL LOW (ref 3.5–5.0)
Anion gap: 14 (ref 5–15)
Anion gap: 12 (ref 5–15)
BUN: 45 mg/dL — ABNORMAL HIGH (ref 6–20)
BUN: 47 mg/dL — ABNORMAL HIGH (ref 6–20)
CO2: 21 mmol/L — ABNORMAL LOW (ref 22–32)
CO2: 20 mmol/L — ABNORMAL LOW (ref 22–32)
Calcium: 9 mg/dL (ref 8.9–10.3)
Calcium: 8.7 mg/dL — ABNORMAL LOW (ref 8.9–10.3)
Chloride: 98 mmol/L — ABNORMAL LOW (ref 101–111)
Chloride: 100 mmol/L — ABNORMAL LOW (ref 101–111)
Creatinine, Ser: 10.04 mg/dL — ABNORMAL HIGH (ref 0.44–1.00)
Creatinine, Ser: 9.76 mg/dL — ABNORMAL HIGH (ref 0.44–1.00)
GFR calc Af Amer: 4 mL/min — ABNORMAL LOW (ref >60)
GFR calc Af Amer: 4 mL/min — ABNORMAL LOW (ref >60)
GFR calc non Af Amer: 4 mL/min — ABNORMAL LOW (ref >60)
GFR calc non Af Amer: 4 mL/min — ABNORMAL LOW (ref >60)
Glucose, Bld: 97 mg/dL (ref 65–99)
Glucose, Bld: 132 mg/dL — ABNORMAL HIGH (ref 65–99)
Phosphorus: 4.2 mg/dL (ref 2.5–4.6)
Phosphorus: 4.6 mg/dL (ref 2.5–4.6)
Potassium: 3.4 mmol/L — ABNORMAL LOW (ref 3.5–5.1)
Potassium: 3.8 mmol/L (ref 3.5–5.1)
Sodium: 133 mmol/L — ABNORMAL LOW (ref 135–145)
Sodium: 132 mmol/L — ABNORMAL LOW (ref 135–145)

## 2018-05-05 LAB — CBC
HCT: 33.2 % — ABNORMAL LOW (ref 36.0–46.0)
HCT: 32 % — ABNORMAL LOW (ref 36.0–46.0)
Hemoglobin: 10.7 g/dL — ABNORMAL LOW (ref 12.0–15.0)
Hemoglobin: 10 g/dL — ABNORMAL LOW (ref 12.0–15.0)
MCH: 27.4 pg (ref 26.0–34.0)
MCH: 26.7 pg (ref 26.0–34.0)
MCHC: 32.2 g/dL (ref 30.0–36.0)
MCHC: 31.3 g/dL (ref 30.0–36.0)
MCV: 85.1 fL (ref 78.0–100.0)
MCV: 85.6 fL (ref 78.0–100.0)
Platelets: 154 10*3/uL (ref 150–400)
Platelets: 146 10*3/uL — ABNORMAL LOW (ref 150–400)
RBC: 3.9 MIL/uL (ref 3.87–5.11)
RBC: 3.74 MIL/uL — ABNORMAL LOW (ref 3.87–5.11)
RDW: 16.7 % — ABNORMAL HIGH (ref 11.5–15.5)
RDW: 16.8 % — ABNORMAL HIGH (ref 11.5–15.5)
WBC: 24.1 10*3/uL — ABNORMAL HIGH (ref 4.0–10.5)
WBC: 18.1 10*3/uL — ABNORMAL HIGH (ref 4.0–10.5)

## 2018-05-05 LAB — MRSA PCR SCREENING: MRSA by PCR: NEGATIVE

## 2018-05-05 LAB — HEPARIN LEVEL (UNFRACTIONATED): Heparin Unfractionated: 0.91 IU/mL — ABNORMAL HIGH (ref 0.30–0.70)

## 2018-05-05 LAB — MAGNESIUM: Magnesium: 1.8 mg/dL (ref 1.7–2.4)

## 2018-05-05 SURGERY — ARTERIOVENOUS (AV) FISTULA CREATION
Anesthesia: General | Site: Arm Upper

## 2018-05-05 MED ORDER — ONDANSETRON HCL 4 MG/2ML IJ SOLN: 4.0000 mg | Freq: Once | INTRAMUSCULAR | Status: DC | PRN

## 2018-05-05 MED ORDER — OXYCODONE HCL 5 MG PO TABS: 5.0000 mg | ORAL_TABLET | Freq: Once | ORAL | Status: DC | PRN

## 2018-05-05 MED ORDER — LIDOCAINE-PRILOCAINE 2.5-2.5 % EX CREA: 1.0000 "application " | TOPICAL_CREAM | CUTANEOUS | Status: DC | PRN

## 2018-05-05 MED ORDER — SODIUM CHLORIDE 0.9 % IV SOLN
INTRAVENOUS | Status: DC | PRN
  Administered 2018-05-05: 500 mL

## 2018-05-05 MED ORDER — PENTAFLUOROPROP-TETRAFLUOROETH EX AERO: 1.0000 "application " | INHALATION_SPRAY | CUTANEOUS | Status: DC | PRN

## 2018-05-05 MED ORDER — ONDANSETRON HCL 4 MG/2ML IJ SOLN
INTRAMUSCULAR | Status: DC | PRN
  Administered 2018-05-05: 4 mg via INTRAVENOUS

## 2018-05-05 MED ORDER — HEPARIN SODIUM (PORCINE) 1000 UNIT/ML DIALYSIS
1000.0000 [IU] | INTRAMUSCULAR | Status: DC | PRN
  Filled 2018-05-05: qty 1

## 2018-05-05 MED ORDER — LIDOCAINE-EPINEPHRINE (PF) 1 %-1:200000 IJ SOLN
INTRAMUSCULAR | Status: DC | PRN
  Administered 2018-05-05: 12 mL

## 2018-05-05 MED ORDER — HEPARIN SODIUM (PORCINE) 1000 UNIT/ML DIALYSIS
100.0000 [IU]/kg | INTRAMUSCULAR | Status: DC | PRN
  Filled 2018-05-05: qty 9

## 2018-05-05 MED ORDER — SODIUM CHLORIDE 0.9 % IV SOLN: 100.0000 mL | INTRAVENOUS | Status: DC | PRN

## 2018-05-05 MED ORDER — OXYCODONE HCL 5 MG/5ML PO SOLN: 5.0000 mg | Freq: Once | ORAL | Status: DC | PRN

## 2018-05-05 MED ORDER — CEFAZOLIN SODIUM-DEXTROSE 2-4 GM/100ML-% IV SOLN: 2.0000 g | INTRAVENOUS | Status: DC

## 2018-05-05 MED ORDER — SODIUM CHLORIDE 0.9 % IV SOLN
INTRAVENOUS | Status: DC
  Administered 2018-05-05: 09:00:00 via INTRAVENOUS

## 2018-05-05 MED ORDER — FENTANYL CITRATE (PF) 100 MCG/2ML IJ SOLN: 25.0000 ug | INTRAMUSCULAR | Status: DC | PRN

## 2018-05-05 MED ORDER — LIDOCAINE 2% (20 MG/ML) 5 ML SYRINGE
INTRAMUSCULAR | Status: DC | PRN
  Administered 2018-05-05: 30 mg via INTRAVENOUS

## 2018-05-05 MED ORDER — HEPARIN SODIUM (PORCINE) 1000 UNIT/ML DIALYSIS
1000.0000 [IU] | INTRAMUSCULAR | Status: DC | PRN
Start: 2018-05-05 — End: 2018-05-05

## 2018-05-05 MED ORDER — 0.9 % SODIUM CHLORIDE (POUR BTL) OPTIME
TOPICAL | Status: DC | PRN
  Administered 2018-05-05: 1000 mL

## 2018-05-05 MED ORDER — ZOLPIDEM TARTRATE 5 MG PO TABS
5.0000 mg | ORAL_TABLET | Freq: Every evening | ORAL | Status: DC | PRN
  Administered 2018-05-05 – 2018-05-06 (×2): 5 mg via ORAL
  Filled 2018-05-05 (×2): qty 1

## 2018-05-05 MED ORDER — PHENYLEPHRINE 40 MCG/ML (10ML) SYRINGE FOR IV PUSH (FOR BLOOD PRESSURE SUPPORT)
PREFILLED_SYRINGE | INTRAVENOUS | Status: DC | PRN
  Administered 2018-05-05 (×2): 80 ug via INTRAVENOUS

## 2018-05-05 MED ORDER — DEXAMETHASONE SODIUM PHOSPHATE 4 MG/ML IJ SOLN
INTRAMUSCULAR | Status: DC | PRN
  Administered 2018-05-05: 8 mg via INTRAVENOUS

## 2018-05-05 MED ORDER — ALTEPLASE 2 MG IJ SOLR: 2.0000 mg | Freq: Once | INTRAMUSCULAR | Status: DC | PRN

## 2018-05-05 MED ORDER — PROPOFOL 10 MG/ML IV BOLUS
INTRAVENOUS | Status: DC | PRN
  Administered 2018-05-05: 110 mg via INTRAVENOUS

## 2018-05-05 MED ORDER — PHENYLEPHRINE HCL 10 MG/ML IJ SOLN
INTRAVENOUS | Status: DC | PRN
  Administered 2018-05-05: 60 ug/min via INTRAVENOUS

## 2018-05-05 MED ORDER — LIDOCAINE HCL (PF) 1 % IJ SOLN: 5.0000 mL | INTRAMUSCULAR | Status: DC | PRN

## 2018-05-05 MED ORDER — MIDAZOLAM HCL 5 MG/5ML IJ SOLN
INTRAMUSCULAR | Status: DC | PRN
  Administered 2018-05-05: 1 mg via INTRAVENOUS

## 2018-05-05 MED ORDER — FENTANYL CITRATE (PF) 100 MCG/2ML IJ SOLN
INTRAMUSCULAR | Status: DC | PRN
  Administered 2018-05-05: 25 ug via INTRAVENOUS

## 2018-05-05 SURGICAL SUPPLY — 54 items
ARMBAND PINK RESTRICT EXTREMIT (MISCELLANEOUS) ×3 IMPLANT
BAG DECANTER FOR FLEXI CONT (MISCELLANEOUS) ×3 IMPLANT
BIOPATCH RED 1 DISK 7.0 (GAUZE/BANDAGES/DRESSINGS) IMPLANT
CANISTER SUCT 3000ML PPV (MISCELLANEOUS) ×3 IMPLANT
CATH PALINDROME RT-P 15FX19CM (CATHETERS) IMPLANT
CATH PALINDROME RT-P 15FX23CM (CATHETERS) IMPLANT
CATH PALINDROME RT-P 15FX28CM (CATHETERS) IMPLANT
CATH PALINDROME RT-P 15FX55CM (CATHETERS) IMPLANT
CLIP VESOCCLUDE MED 6/CT (CLIP) ×3 IMPLANT
CLIP VESOCCLUDE SM WIDE 6/CT (CLIP) ×3 IMPLANT
COVER PROBE W GEL 5X96 (DRAPES) ×3 IMPLANT
COVER SURGICAL LIGHT HANDLE (MISCELLANEOUS) IMPLANT
DERMABOND ADVANCED (GAUZE/BANDAGES/DRESSINGS) ×1
DERMABOND ADVANCED .7 DNX12 (GAUZE/BANDAGES/DRESSINGS) ×2 IMPLANT
DRAPE C-ARM 42X72 X-RAY (DRAPES) IMPLANT
DRAPE CHEST BREAST 15X10 FENES (DRAPES) IMPLANT
ELECT REM PT RETURN 9FT ADLT (ELECTROSURGICAL) ×3
ELECTRODE REM PT RTRN 9FT ADLT (ELECTROSURGICAL) ×2 IMPLANT
GAUZE SPONGE 4X4 16PLY XRAY LF (GAUZE/BANDAGES/DRESSINGS) IMPLANT
GLOVE BIO SURGEON STRL SZ7.5 (GLOVE) ×3 IMPLANT
GLOVE BIOGEL PI IND STRL 6.5 (GLOVE) ×6 IMPLANT
GLOVE BIOGEL PI IND STRL 7.5 (GLOVE) ×2 IMPLANT
GLOVE BIOGEL PI INDICATOR 6.5 (GLOVE) ×3
GLOVE BIOGEL PI INDICATOR 7.5 (GLOVE) ×1
GLOVE ECLIPSE 7.0 STRL STRAW (GLOVE) ×3 IMPLANT
GLOVE SURG SS PI 6.5 STRL IVOR (GLOVE) ×3 IMPLANT
GLOVE SURG SS PI 7.5 STRL IVOR (GLOVE) ×3 IMPLANT
GOWN STRL REUS W/ TWL LRG LVL3 (GOWN DISPOSABLE) ×4 IMPLANT
GOWN STRL REUS W/ TWL XL LVL3 (GOWN DISPOSABLE) ×2 IMPLANT
GOWN STRL REUS W/TWL LRG LVL3 (GOWN DISPOSABLE) ×2
GOWN STRL REUS W/TWL XL LVL3 (GOWN DISPOSABLE) ×1
KIT BASIN OR (CUSTOM PROCEDURE TRAY) ×3 IMPLANT
KIT TURNOVER KIT B (KITS) ×3 IMPLANT
NEEDLE 18GX1X1/2 (RX/OR ONLY) (NEEDLE) IMPLANT
NEEDLE HYPO 25GX1X1/2 BEV (NEEDLE) IMPLANT
NS IRRIG 1000ML POUR BTL (IV SOLUTION) ×3 IMPLANT
PACK CV ACCESS (CUSTOM PROCEDURE TRAY) ×3 IMPLANT
PACK SURGICAL SETUP 50X90 (CUSTOM PROCEDURE TRAY) IMPLANT
PAD ARMBOARD 7.5X6 YLW CONV (MISCELLANEOUS) ×6 IMPLANT
SOAP 2 % CHG 4 OZ (WOUND CARE) ×3 IMPLANT
SUT ETHILON 3 0 PS 1 (SUTURE) IMPLANT
SUT MNCRL AB 4-0 PS2 18 (SUTURE) ×3 IMPLANT
SUT PROLENE 6 0 BV (SUTURE) ×3 IMPLANT
SUT VIC AB 3-0 SH 27 (SUTURE) ×1
SUT VIC AB 3-0 SH 27X BRD (SUTURE) ×2 IMPLANT
SYR 10ML LL (SYRINGE) IMPLANT
SYR 20CC LL (SYRINGE) IMPLANT
SYR 5ML LL (SYRINGE) IMPLANT
SYR CONTROL 10ML LL (SYRINGE) IMPLANT
TOWEL GREEN STERILE (TOWEL DISPOSABLE) ×3 IMPLANT
TOWEL GREEN STERILE FF (TOWEL DISPOSABLE) ×3 IMPLANT
TOWEL OR 17X26 4PK STRL BLUE (TOWEL DISPOSABLE) ×3 IMPLANT
UNDERPAD 30X30 (UNDERPADS AND DIAPERS) ×3 IMPLANT
WATER STERILE IRR 1000ML POUR (IV SOLUTION) ×3 IMPLANT

## 2018-05-05 NOTE — Progress Notes (Signed)
PT Cancellation Note  Patient Details Name: Whitney Walker MRN: [id number] DOB: 1956/06/10   Cancelled Treatment:    Reason Eval/Treat Not Completed: Patient at procedure or test/unavailable. Pt left for HD  20-30 minutes ago.   [place] 05/05/2018, 1:41 PM [place]

## 2018-05-05 NOTE — Progress Notes (Signed)
Accepted at [facility] .1at treatment Friday May 08 2018 at 11am.Schedule and  Time Monday,Wednesday,Friday at 12:45pm

## 2018-05-05 NOTE — Anesthesia Procedure Notes (Signed)
Procedure Name: LMA Insertion Date/Time: 05/05/2018 9:29 AM Performed by: Orlie Dakin, CRNA Pre-anesthesia Checklist: Patient identified, Emergency Drugs available, Suction available, Patient being monitored and Timeout performed Patient Re-evaluated:Patient Re-evaluated prior to induction Oxygen Delivery Method: Circle system utilized Preoxygenation: Pre-oxygenation with 100% oxygen Induction Type: IV induction Ventilation: Mask ventilation without difficulty LMA: LMA with gastric port inserted LMA Size: 4.0 Tube type: Oral Number of attempts: 1 Placement Confirmation: positive ETCO2 and breath sounds checked- equal and bilateral Tube secured with: Tape Dental Injury: Teeth and Oropharynx as per pre-operative assessment

## 2018-05-05 NOTE — Progress Notes (Signed)
  Progress Note    05/05/2018 9:20 AM Day of Surgery  Subjective: no new complaints  Vitals:   05/05/18 0028 05/05/18 0433  BP: 122/61 137/82  Pulse: 70 87  Resp: (!) 23 15  Temp: (!) 97.1 F (36.2 C) 98.2 F (36.8 C)  SpO2: 96% 99%    Physical Exam: [id number] Non labored respirations Palpable left radial pulse  CBC    Component Value Date/Time   WBC 24.1 (H) 05/05/2018 0339   RBC 3.90 05/05/2018 0339   HGB 10.7 (L) 05/05/2018 0339   HGB 10.9 (L) 07/07/2017 1235   HCT 33.2 (L) 05/05/2018 0339   HCT 35.0 07/07/2017 1235   PLT 154 05/05/2018 0339   PLT 275 07/07/2017 1235   MCV 85.1 05/05/2018 0339   MCV 85.2 07/07/2017 1235   MCH 27.4 05/05/2018 0339   MCHC 32.2 05/05/2018 0339   RDW 16.7 (H) 05/05/2018 0339   RDW 15.8 (H) 07/07/2017 1235   LYMPHSABS 1.1 04/17/2018 1348   LYMPHSABS 2.4 07/07/2017 1235   MONOABS 0.7 04/17/2018 1348   MONOABS 0.8 07/07/2017 1235   EOSABS 0.0 04/17/2018 1348   EOSABS 0.2 07/07/2017 1235   BASOSABS 0.0 04/17/2018 1348   BASOSABS 0.0 07/07/2017 1235    BMET    Component Value Date/Time   NA 133 (L) 05/05/2018 0339   NA 138 07/07/2017 1235   K 3.4 (L) 05/05/2018 0339   K 4.5 07/07/2017 1235   CL 98 (L) 05/05/2018 0339   CL 111 (H) 12/04/2012 1056   CO2 21 (L) 05/05/2018 0339   CO2 17 (L) 07/07/2017 1235   GLUCOSE 97 05/05/2018 0339   GLUCOSE 85 07/07/2017 1235   GLUCOSE 97 12/04/2012 1056   BUN 45 (H) 05/05/2018 0339   BUN 40.8 (H) 07/07/2017 1235   CREATININE 10.04 (H) 05/05/2018 0339   CREATININE 3.4 (HH) 07/07/2017 1235   CALCIUM 9.0 05/05/2018 0339   CALCIUM 9.5 07/07/2017 1235   GFRNONAA 4 (L) 05/05/2018 0339   GFRAA 4 (L) 05/05/2018 0339    INR    Component Value Date/Time   INR 1.29 04/29/2018 1653     Intake/Output Summary (Last 24 hours) at 05/05/2018 0920 Last data filed at 05/04/2018 1700 Gross per 24 hour  Intake 240 ml  Output -  Net 240 ml     Assessment:  62 y.o. female is here with new esrd  now dialyzing via catheter.   Plan: OR today for left arm avf vs graft.   Hideko Esselman C. Donzetta Matters, MD [facility] [place] Office: [phone] Pager: [phone]  05/05/2018 9:20 AM

## 2018-05-05 NOTE — Op Note (Signed)
    Patient name: Whitney Walker MRN: [id number] DOB: February 15, 1956 Sex: female  05/05/2018 Pre-operative Diagnosis: End-stage renal disease Post-operative diagnosis:  Same Surgeon:  Erlene Quan C. Donzetta Matters, MD Assistant: Laurence Slate, PA Procedure Performed: Left arm brachiocephalic AV fistula   Indications: 62 year old female has now progressed to end-stage renal disease and has a right IJ tunneled catheter for which she is receiving dialysis.  She is now indicated for permanent access in her left upper extremity given that she is right-hand dominant.  Findings: Cephalic vein at the antecubitum easily dilated to 4 mm.  More cephalad in the arm it was approximately 2-1/2 to 3 mm by ultrasound.  The brachial artery had a 3 mm external diameter.  I completion there was a palpable thrill in the runoff cephalic vein traced up the arm although the vein was quite a bit deep and there was a strong signal at the radial artery at the wrist.   Procedure:  The patient was identified in the holding area and taken to the operating room where she was placed supine on the operative table and MAC anesthesia induced.  She was sterilely prepped and draped in the left upper extremity usual fashion given antibiotics and a timeout was called.  We began with ultrasound-guided evaluation of the left upper extremity where we identified both the basilic and cephalic vein that appeared usable fistula creation.  We then anesthetized the arm below the antecubitum with 1% lidocaine with epinephrine.  Transverse incision was then made and we traced out the cephalic vein and marked for orientation.  There is a connecting branch between the basilic which was divided between ties keeping the basilic in line.  Cephalic vein was then divided distally and dilated up to 4 mm and flushed with heparinized saline.  We divided the deep fascia to expose the brachial artery and placed a vessel loop around this.  The artery was then clamped distally and  proximally and opened longitudinally flushed with heparinized saline both directions.  The vein was then spatulated and sewn end-to-side with 6-0 Prolene suture.  Prior to the completion of the anastomosis we allowed flushing from all directions.  I completion there is a strong thrill in the vein confirmed with Doppler as well as a strong radial artery signal and this did not augment with compression of the fistula.  Satisfied with this we obtained states the wound closed in 2 layers with Vicryl Monocryl.  Dermabond was placed to level the skin.  Patient tolerated procedure well without any comp occasion.  Next  EBL 20 cc.   Juliona Vales C. Donzetta Matters, MD [facility] [place] Office: [phone] Pager: [phone]

## 2018-05-05 NOTE — Anesthesia Preprocedure Evaluation (Addendum)
Anesthesia Evaluation  Patient identified by MRN, date of birth, ID band Patient awake    Reviewed: Allergy & Precautions, NPO status , [name] Chart, lab work & pertinent test results  Airway Mallampati: II  TM Distance: >3 FB     Dental  (+) Edentulous Upper, Edentulous Lower   Pulmonary Current Smoker,    breath sounds clear to auscultation       Cardiovascular hypertension,  Rhythm:Regular Rate:Normal     Neuro/Psych    GI/Hepatic   Endo/Other    Renal/GU      Musculoskeletal   Abdominal (+) + obese,   Peds  Hematology   Anesthesia Other Findings   Reproductive/Obstetrics                             Anesthesia Physical Anesthesia Plan  ASA: III  Anesthesia Plan: General   Post-op Pain Management:    Induction: Intravenous  PONV Risk Score and Plan:   Airway Management Planned: LMA  Additional Equipment:   Intra-op Plan:   Post-operative Plan:   Informed Consent: I have reviewed the patients History and Physical, chart, labs and discussed the procedure including the risks, benefits and alternatives for the proposed anesthesia with the patient or authorized representative who has indicated his/her understanding and acceptance.     Plan Discussed with:   Anesthesia Plan Comments:         Anesthesia Quick Evaluation

## 2018-05-05 NOTE — Progress Notes (Signed)
ANTICOAGULATION CONSULT NOTE   Pharmacy Consult for Heparin Indication: history of PE  Patient Measurements: Height: 5' 2.99" (160 cm) Weight: 183 lb 14.4 oz (83.4 kg) IBW/kg (Calculated) : 52.38 Heparin Dosing Weight: 73  Vital Signs: Temp: 98.2 F (36.8 C) (05/07 0433) Temp Source: Oral (05/07 0433) BP: 137/82 (05/07 0433) Pulse Rate: 87 (05/07 0433)  Labs: Recent Labs    05/03/18 0311 05/04/18 0303 05/04/18 1149 05/04/18 2137 05/05/18 0339  HGB 10.8* 10.8*  --   --  10.7*  HCT 33.0* 34.1*  --   --  33.2*  PLT 152 153  --   --  154  HEPARINUNFRC 0.44 <0.10* 0.22* 0.51 0.91*  CREATININE 5.90* 7.85*  --   --   --       Assessment: 62 year old female s/p HD access with history of PE. Patient historically on apixaban on hold for HD access (fistula versus graft next week) on 5/7.  -heparin level now elevated -heparin off on call to surgery 5/7  Goal of Therapy:  Heparin level 0.3-0.7 units/ml Monitor platelets by anticoagulation protocol: Yes   Plan:  Decrease heparin to 1200 units/hr F/u after [place], PharmD Clinical Pharmacist Phone: [phone]

## 2018-05-05 NOTE — Anesthesia Postprocedure Evaluation (Signed)
Anesthesia Post Note  Patient: Whitney Walker  Procedure(s) Performed: ARTERIOVENOUS (AV) FISTULA CREATION BRACHIOCEPHALIC (Left Arm Upper)     Patient location during evaluation: PACU Anesthesia Type: General Level of consciousness: awake and alert Pain management: pain level controlled Vital Signs Assessment: post-procedure vital signs reviewed and stable Respiratory status: spontaneous breathing, nonlabored ventilation, respiratory function stable and patient connected to nasal cannula oxygen Cardiovascular status: blood pressure returned to baseline and stable Postop Assessment: no apparent nausea or vomiting Anesthetic complications: no    Last Vitals:  Vitals:   05/05/18 1112 05/05/18 1127  BP: (!) 106/51 (!) 117/56  Pulse: 81 81  Resp: 12 15  Temp:    SpO2: 94% 91%    Last Pain:  Vitals:   05/05/18 1110  TempSrc:   PainSc: 0-No pain                 Garald Rhew COKER

## 2018-05-05 NOTE — Transfer of Care (Signed)
Immediate Anesthesia Transfer of Care Note  Patient: Whitney Walker  Procedure(s) Performed: ARTERIOVENOUS (AV) FISTULA CREATION BRACHIOCEPHALIC (Left Arm Upper)  Patient Location: PACU  Anesthesia Type:General  Level of Consciousness: awake, oriented and patient cooperative  Airway & Oxygen Therapy: Patient Spontanous Breathing and Patient connected to face mask oxygen  Post-op Assessment: Report given to RN and Post -op Vital signs reviewed and stable  Post vital signs: Reviewed and stable  Last Vitals:  Vitals Value Taken Time  BP 119/61 05/05/2018 10:42 AM  Temp    Pulse 90 05/05/2018 10:45 AM  Resp 21 05/05/2018 10:45 AM  SpO2 100 % 05/05/2018 10:45 AM  Vitals shown include unvalidated device data.  Last Pain:  Vitals:   05/05/18 0745  TempSrc:   PainSc: 0-No pain         Complications: No apparent anesthesia complications

## 2018-05-05 NOTE — Plan of Care (Signed)
  Problem: Education: Goal: Knowledge of General Education information will improve Outcome: Progressing   Problem: Clinical Measurements: Goal: Ability to maintain clinical measurements within normal limits will improve Outcome: Progressing Goal: Will remain free from infection Outcome: Progressing

## 2018-05-05 NOTE — Progress Notes (Signed)
[facility] Progress Note    Assessment/ Plan:   62 yo female with CKD V, morbid obesity, hypertension, congestive heart failure, CAD, COPD, reported history of colon cancer and depression. She has been followed by Dr. [name] for advanced CKD, last seen on 04/14/18, with plans for outpatient fistula placement on 05/01/18. Admitted on 04/29/18 for uremia due to worsening kidney function now doing well after starting HD.  1. ESRD s/p TDC placement. Will likely receive HD today post surgery vs tomorrow. VVS to address perm access, plan for AVF vs graft today. 2. Hypokalemia, improved. K 3.4. Monitor 3. Secondary hyperparathyroidism. PTH 382. On calcitriol 0.63mcg every other day 4. HTN, controlled. Holding home amlodipine and labetalol as BP improved with HD. 5. Elevated phosphorous, improved on HD. Patient refusing velphoro and since levels improved will DC  6. Anemia due to CKD. s/p 2U RBC on 04/30/18, now Hgb stable at 10.7. On aranesp 246mcg weekly. Iron sat 97% on po oral iron as outpatient, stopped to simplify pill burden as can intermittently receive IV iron with HD in the future. 7. Obesity  8. Low mag, resolved. On po 800mg  BID. 9. Hidradenitis, on doxycycline per primary.  Subjective:   Feels better today. Cramping is much improved and now only in her hands. No new complaints.   Objective:   BP 137/82 (BP Location: Right Arm)   Pulse 87   Temp 98.2 F (36.8 C) (Oral)   Resp 15   Ht 5' 2.99" (1.6 m)   Wt 186 lb 8 oz (84.6 kg)   SpO2 99%   BMI 33.05 kg/m   Intake/Output Summary (Last 24 hours) at 05/05/2018 0735 Last data filed at 05/04/2018 1700 Gross per 24 hour  Intake 240 ml  Output -  Net 240 ml   Weight change: 2 lb 9.6 oz (1.179 kg)  Physical Exam: Gen: laying bed, in NAD. R TDC in place CVS: regular, S1 and S2 normal, grade II systolic murmur.  Resp: CTAB with good air movement. Normal effort on room air Abd: obese, soft, nontender, nondistended, no  hepatomegaly Ext: no edema  Imaging: No results found.  Labs: BMET Recent Labs  Lab 04/29/18 1334 04/30/18 0015 05/01/18 0036 05/02/18 0342 05/03/18 0311 05/04/18 0303 05/05/18 0339  NA 136 140  140 142 139 135 134* 133*  K 6.2* 3.0*  3.0* 3.3* 2.9* 2.8* 3.2* 3.4*  CL 112* 109  109 102 102 97* 100* 98*  CO2 <7* 11*  12* 19* 21* 24 21* 21*  GLUCOSE 79 85  82 115* 105* 94 140* 97  BUN 155* 96*  98* 44* 52* 27* 32* 45*  CREATININE 14.82* 10.01*  10.02* 5.71* 7.83* 5.90* 7.85* 10.04*  CALCIUM 7.0* 7.5*  7.5* 7.7* 8.1* 8.1* 8.7* 9.0  PHOS  --  7.1* 4.7* 4.8* 3.3 3.2 4.2   CBC Recent Labs  Lab 05/02/18 0342 05/03/18 0311 05/04/18 0303 05/05/18 0339  WBC 21.6* 28.0* 23.6* 24.1*  HGB 10.1* 10.8* 10.8* 10.7*  HCT 30.8* 33.0* 34.1* 33.2*  MCV 80.2 81.7 84.0 85.1  PLT 202 152 153 154    Medications:    . calcitRIOL  0.5 mcg Oral QODAY  . ciprofloxacin-dexamethasone  4 drop Right EAR BID  . darbepoetin (ARANESP) injection - DIALYSIS  200 mcg Intravenous Q Thu-HD  . doxycycline  100 mg Oral BID  . magnesium oxide  800 mg Oral BID  . multivitamin  1 tablet Oral QHS  . mupirocin cream   Topical  BID  . pantoprazole  40 mg Oral Daily  . pramipexole  0.125 mg Oral TID  . sucroferric oxyhydroxide  500 mg Oral TID WC      Bufford Lope, DO PGY-2, [facility] 05/05/2018 7:35 AM

## 2018-05-05 NOTE — Progress Notes (Addendum)
PROGRESS NOTE   Whitney Walker  DDU:[id number]    DOB: [birth year]    DOA: 04/29/2018  PCP: Glendale Chard, MD   I have briefly reviewed patients previous medical [facility] in [name].  Brief Narrative:  62 year old female patient, lives alone, independent, PMH of HTN, CKD (Dr. [name]), DVT/PE on Eliquis, CAD, chronic diastolic CHF, PAD, anemia, tobacco abuse, COPD, anxiety and depression was sent by her PCP to ED on 04/29/2018 due to weakness, decreased appetite, dyspnea, confusion, itching and worsening labs over 2 weeks (creatinine increased from 9-15 and potassium from 5.7-6.2, hemoglobin dropped from 8.8-7).  She was admitted for new ESRD with uremia.  Nephrology was consulted and HD initiated this admission.  Vascular surgery placed [facility] 5/3 and performed left arm brachiocephalic AV fistula 5/7.  Awaiting CLIP and nephrology clearance prior to DC.  PT consulted.   Assessment & Plan:   Active Problems:   Leucocytosis   Morbid (severe) obesity due to excess calories (HCC)   Hyperkalemia   OSA (obstructive sleep apnea)   ESRD (end stage renal disease) ([name])   New ESRD p/w uremia/hyperkalemia/volume overload/metabolic acidosis/uremic encephalopathy: Patient presented with progressive chronic kidney disease and uremic.  Nephrology consulted.  Temporary right IJ HD catheter placed by CCM and HD was started the night of admission.  Vascular surgery changed temporary HD catheter to [facility] 5/3.  Patient was scheduled to have LUE AV fistula versus graft as outpatient and Eliquis had been temporarily held.  Transitioned to IV heparin and underwent left arm brachiocephalic AV fistula on 02/01/7627.  Completed day #3 of HD on 5/4.  Multiple electrolyte abnormalities have gradually improved.  Cramps have decreased. CLIP process ongoing.  As per nephrology, plans for HD postprocedure today or tomorrow.  Await nephrology clearance for discharge.  PT consulted to determine disposal, home with home health (lives  alone) versus SNF.  Hypokalemia/hypomagnesemia: Magnesium 1.8.  Potassium still low at 3.4 >Per nephrology.  On oral magnesium.  Improving.  Essential hypertension: Blood pressures improved after dialysis.  Amlodipine held.  Stable.  Anemia in ESRD: Transfused 2 units PRBC on 5/2.  Hemoglobin stable in the 10 g range.  Continue Aranesp.  Stable  Leukocytosis: Unclear etiology.  No clear focus of infection.  Evaluated by Dr. Burr Medico, hematology for chronic leukocytosis.  BCR/ABL Jak 2 mutation negative.  No previous evidence of myeloproliferative disease.  Fluctuating leukocytosis,?  Stress response,?Hidradinitis. Follow.  Secondary hyperparathyroidism: Per nephrology.  Continue calcitriol.  Right earache: My colleague Dr. [name] evaluated and discussed with Dr. [name], ENT who recommended Ciprodex 4 drops twice daily for 7 days and outpatient follow-up with him.  Resolved.  History of recurrent DVT/PE: Was on Eliquis PTA.  Currently switched to IV heparin drip for vascular procedure which she underwent 5/7. May need to go on Warfarin after that.  Await vascular surgery clearance to resume anticoagulation.  OSA: CPAP.  History of colon cancer status post resection 2009: Outpatient follow-up regarding screening colonoscopy.  Tobacco abuse: Cessation counseled.  Suspected Hidradenitis: of pubic area.  Doxycycline course for 5-7 days, started 5/5.  Insomnia: Reports that she has not slept in a couple days, trazodone has not worked and states that Ambien works better.  Changed to Ambien.  Restless leg syndrome: Continue pramipexole.   DVT prophylaxis: Currently on IV heparin drip Code Status: Full Family Communication: d/w patient's 2 daughters at bedside. Disposition: DC pending further clinical improvement, CLIP and nephrology clearance.  PT consulted.   Consultants:  CCM Vascular surgery  Nephrology  Procedures:  Temporary RIJ HD by CCM on 5/1 Removal of temporary dialysis catheter  and placement of tunneled HD 5/3 HD left arm brachiocephalic AV fistula 5/7  Antimicrobials:  Ciprodex eardrops 5/3 > Doxycycline PO 5/5 >   Subjective: Patient seen this morning prior to procedure.  2 daughters at bedside.  Reports difficulty sleeping at night despite trazodone and indicates Ambien works better for her.  Did not report muscle cramps.  Tolerating diet and did not report nausea, vomiting, abdominal pain or difficulty swallowing.  As per daughter, confusion slowly improving.  ROS: As above.  Objective:  Vitals:   05/05/18 1112 05/05/18 1127 05/05/18 1130 05/05/18 1211  BP: (!) 106/51 (!) 117/56  (!) 149/79  Pulse: 81 81 84 86  Resp: 12 15 14 16   Temp:   98.2 F (36.8 C) 98.2 F (36.8 C)  TempSrc:    Oral  SpO2: 94% 91% 94% 96%  Weight:      Height:        Examination:  General exam: Pleasant middle-aged female, moderately built and overweight sitting up comfortably in bed.  Oral mucosa moist. Respiratory system: Clear to auscultation. Respiratory effort normal.  Right TDC +.  Stable without change. Cardiovascular system: S1 & S2 heard, RRR. No JVD, murmurs, rubs, gallops or clicks.  No ankle edema.  Telemetry personally reviewed: Sinus rhythm.  Occasional mild sinus tachycardia. Gastrointestinal system: Abdomen is nondistended, soft and nontender. No organomegaly or masses felt. Normal bowel sounds heard.  Stable without change. Central nervous system: Alert and oriented. No focal neurological deficits.  Stable. Extremities: Symmetric 5 x 5 power. Skin: Patient was examined along with her female RN in room on 5/5.  Ruptured folliculitis/superficial abscess over R side of mons pubis. Indurated ~2 x 1 cm area with mild this but no fluctuance at the left groin fold area. Psychiatry: Judgement and insight appear normal. Mood & affect appropriate.     Data Reviewed: I have personally reviewed following labs and imaging studies  CBC: Recent Labs  Lab  05/01/18 0036 05/02/18 0342 05/03/18 0311 05/04/18 0303 05/05/18 0339  WBC 14.0* 21.6* 28.0* 23.6* 24.1*  HGB 10.0* 10.1* 10.8* 10.8* 10.7*  HCT 29.1* 30.8* 33.0* 34.1* 33.2*  MCV 77.8* 80.2 81.7 84.0 85.1  PLT 206 202 152 153 161   Basic Metabolic Panel: Recent Labs  Lab 05/01/18 0036 05/02/18 0342 05/03/18 0311 05/04/18 0303 05/05/18 0339  NA 142 139 135 134* 133*  K 3.3* 2.9* 2.8* 3.2* 3.4*  CL 102 102 97* 100* 98*  CO2 19* 21* 24 21* 21*  GLUCOSE 115* 105* 94 140* 97  BUN 44* 52* 27* 32* 45*  CREATININE 5.71* 7.83* 5.90* 7.85* 10.04*  CALCIUM 7.7* 8.1* 8.1* 8.7* 9.0  MG 1.6* 1.6* 1.7 1.7 1.8  PHOS 4.7* 4.8* 3.3 3.2 4.2   Liver Function Tests: Recent Labs  Lab 04/29/18 1653 04/29/18 2000  05/01/18 0036 05/02/18 0342 05/03/18 0311 05/04/18 0303 05/05/18 0339  AST 9*  --   --   --   --   --   --   --   ALT 11* 10*  --   --   --   --   --   --   ALKPHOS 71  --   --   --   --   --   --   --   BILITOT 0.6  --   --   --   --   --   --   --  PROT 7.0  --   --   --   --   --   --   --   ALBUMIN 2.9*  --    < > 2.9* 2.9* 3.0* 2.9* 2.8*   < > = values in this interval not displayed.   Coagulation Profile: Recent Labs  Lab 04/29/18 1653  INR 1.29   CBG: Recent Labs  Lab 04/30/18 2308 05/02/18 1642  GLUCAP 124* 121*    Recent Results (from the past 240 hour(s))  MRSA PCR Screening     Status: None   Collection Time: 05/04/18  9:43 PM  Result Value Ref Range Status   MRSA by PCR NEGATIVE NEGATIVE Final    Comment:        The GeneXpert MRSA Assay (FDA approved for NASAL specimens only), is one component of a comprehensive MRSA colonization surveillance program. It is not intended to diagnose MRSA infection nor to guide or monitor treatment for MRSA infections. Performed at [facility], [place] [address] [place], [place] [address]          Radiology Studies: No results found.      Scheduled Meds: . calcitRIOL  0.5 mcg Oral QODAY   . ciprofloxacin-dexamethasone  4 drop Right EAR BID  . darbepoetin (ARANESP) injection - DIALYSIS  200 mcg Intravenous Q Thu-HD  . doxycycline  100 mg Oral BID  . magnesium oxide  800 mg Oral BID  . multivitamin  1 tablet Oral QHS  . mupirocin cream   Topical BID  . pantoprazole  40 mg Oral Daily  . pramipexole  0.125 mg Oral TID   Continuous Infusions: . heparin Stopped (05/05/18 0840)     LOS: 6 days     Vernell Leep, MD, FACP, [facility]. [facility] Pager [phone]  If 7PM-7AM, please contact night-coverage [email or web address] Password [facility] 05/05/2018, 12:26 PM

## 2018-05-06 ENCOUNTER — Telehealth: Payer: Self-pay | Admitting: Vascular Surgery

## 2018-05-06 ENCOUNTER — Encounter (HOSPITAL_COMMUNITY): Payer: Self-pay | Admitting: Vascular Surgery

## 2018-05-06 DIAGNOSIS — D649 Anemia, unspecified: Secondary | ICD-10-CM

## 2018-05-06 DIAGNOSIS — E875 Hyperkalemia: Principal | ICD-10-CM

## 2018-05-06 DIAGNOSIS — N185 Chronic kidney disease, stage 5: Secondary | ICD-10-CM

## 2018-05-06 DIAGNOSIS — N186 End stage renal disease: Secondary | ICD-10-CM

## 2018-05-06 LAB — RENAL FUNCTION PANEL
Albumin: 2.8 g/dL — ABNORMAL LOW (ref 3.5–5.0)
Anion gap: 11 (ref 5–15)
BUN: 30 mg/dL — ABNORMAL HIGH (ref 6–20)
CO2: 25 mmol/L (ref 22–32)
Calcium: 8.5 mg/dL — ABNORMAL LOW (ref 8.9–10.3)
Chloride: 99 mmol/L — ABNORMAL LOW (ref 101–111)
Creatinine, Ser: 6.99 mg/dL — ABNORMAL HIGH (ref 0.44–1.00)
GFR calc Af Amer: 7 mL/min — ABNORMAL LOW (ref >60)
GFR calc non Af Amer: 6 mL/min — ABNORMAL LOW (ref >60)
Glucose, Bld: 131 mg/dL — ABNORMAL HIGH (ref 65–99)
Phosphorus: 3.5 mg/dL (ref 2.5–4.6)
Potassium: 3.8 mmol/L (ref 3.5–5.1)
Sodium: 135 mmol/L (ref 135–145)

## 2018-05-06 LAB — HEPATITIS B SURFACE ANTIGEN: Hepatitis B Surface Ag: NEGATIVE

## 2018-05-06 MED ORDER — WARFARIN - PHARMACIST DOSING INPATIENT
Freq: Every day | Status: DC
  Administered 2018-05-06: 17:00:00

## 2018-05-06 MED ORDER — WARFARIN SODIUM 7.5 MG PO TABS
7.5000 mg | ORAL_TABLET | Freq: Every day | ORAL | Status: DC
  Administered 2018-05-06: 7.5 mg via ORAL
  Filled 2018-05-06: qty 1

## 2018-05-06 NOTE — Telephone Encounter (Signed)
-----   Message from Mena Goes, RN sent at 05/06/2018  9:44 AM EDT ----- Regarding: 4-6 weeks PA clinic   ----- Message ----- From: Iline Oven Sent: 05/06/2018   7:35 AM To: Vvs Charge Pool  Can you schedule an appt for this pt with fistula duplex in 4-6 weeks on PA clinic.  PO L brachiocephalic fistula. Thanks, [facility]

## 2018-05-06 NOTE — Progress Notes (Signed)
ANTICOAGULATION CONSULT NOTE - Initial Consult  Pharmacy Consult for Wararin Indication: PE history  Allergies  Allergen Reactions  . Gadolinium Derivatives Hives and Other (See Comments)    HIVES, Desc: HIVES W/ "DYE" USED FOR 1ST CT SCAN BUT NOT 2ND, NO PREMEDS USED, PT UNCERTAIN OF CIRCUMSTANCES,,?POSSIBLE MRI CONTRAST ALLERGY, ALL STUDIES DONE "SOMEWHERE" IN [place]//A.C., Onset Date: [id number]  . Iohexol Other (See Comments)     Code: HIVES, Desc: HIVES W/ "DYE" USED FOR 1ST CT SCAN BUT NOT 2ND, NO PREMEDS USED, PT UNCERTAIN OF CIRCUMSTANCES,,?POSSIBLE MRI CONTRAST ALLERGY, ALL STUDIES DONE "SOMEWHERE" IN [place]//A.C., Onset Date: [id number]     Patient Measurements: Height: 5' 2.99" (160 cm) Weight: 192 lb 0.3 oz (87.1 kg) IBW/kg (Calculated) : 52.38  Vital Signs: Temp: 98.2 F (36.8 C) (05/08 0548) Temp Source: Oral (05/08 0548) BP: 128/69 (05/08 0548) Pulse Rate: 96 (05/08 0548)  Labs: Recent Labs    05/04/18 0303 05/04/18 1149 05/04/18 2137 05/05/18 0339 05/05/18 1344 05/05/18 1413 05/06/18 0424  HGB 10.8*  --   --  10.7*  --  10.0*  --   HCT 34.1*  --   --  33.2*  --  32.0*  --   PLT 153  --   --  154  --  146*  --   HEPARINUNFRC <0.10* 0.22* 0.51 0.91*  --   --   --   CREATININE 7.85*  --   --  10.04* 9.76*  --  6.99*    Estimated Creatinine Clearance: 8.7 mL/min (A) (by C-G formula based on SCr of 6.99 mg/dL (H)).   Medical History: Past Medical History:  Diagnosis Date  . Anemia   . Anxiety   . Arthritis   . CHF (congestive heart failure) ([place])   . CKD (chronic kidney disease)    Stage III 03/2013  . Colon cancer ([place])   . COPD (chronic obstructive pulmonary disease) ([place])   . Coronary artery disease   . Depression   . Hypertension   . Meningitis   . PE (pulmonary embolism)   . Peripheral vascular disease ([place])   . Shortness of breath   . Sleep apnea   . Vertigo     Assessment: 62 year old female previously on apixaban for  history of PE now to transition to Coumadin  Goal of Therapy:  INR 2-3 Monitor platelets by anticoagulation protocol: Yes   Plan:  Warfarin 7.5 mg po daily at 1800 pm Daily INR  Thank you Anette Guarneri, PharmD [phone]  05/06/2018,8:01 AM

## 2018-05-06 NOTE — Care Management Note (Signed)
Case Management Note Marvetta Gibbons RN, BSN Unit 4E-Case Manager [phone]  Patient Details  Name: Whitney Walker MRN: [id number] Date of Birth: [birth year]  Subjective/Objective:  Patient admitted with ARF need for urgent HD- new- temp cath placed- will need perm. Access                  Action/Plan: PTA Pt lived at home with family- referral for Lovenox- pt still on heparin- do not know dose at this time- (pt has both Medicare/Medicaid) - May need to be Clipped prior to discharge- CM to follow for transition of care needs  Expected Discharge Date:                  Expected Discharge Plan:  Home/Self Care  In-House Referral:     Discharge planning Services  CM Consult  Post Acute Care Choice:  Durable Medical Equipment Choice offered to:  Patient  DME Arranged:    DME Agency:     HH Arranged:    [place] Agency:     Status of Service:  In process, will continue to follow  If discussed at Long Length of Stay Meetings, dates discussed:    Discharge Disposition:   Additional Comments:  05/06/18- 42- Marvetta Gibbons RN, CM- spoke with pt at bedside regarding transition needs- per PT eval no f/u recommendations made- pt however states she does need/want a shower chair- as pt has medicaid it would be covered - would need DME order for shower chair for discharge- Pt also asking about PCS- would like an aide to assist with "things around the house like cooking/cleaning"- explained to pt that this type of service is covered under her Medicaid benefits however there is an approval process that has to go through [place] and she needs to work with her PCP for this- provided pt with the application for [employer] services [employer]) and the info on the qualifying guidelines and # to call for further questions. Pt to f/u with her PCP regarding PCS needs. Pt has been clipped for outpt HD chair for m/w/f [facility] center.   Dawayne Patricia, RN 05/06/2018, 4:23 PM

## 2018-05-06 NOTE — Evaluation (Signed)
Physical Therapy Evaluation Patient Details Name: Whitney Walker MRN: [id number] DOB: May 30, 1956 Today's Date: 05/06/2018   History of Present Illness  Pt adm with ESRD and HD initiated. PMH - chf, ckd, htn, PE, cad, depression, chf  Clinical Impression  Pt doing well with mobility and no further PT needed.  Ready for dc from PT standpoint.      Follow Up Recommendations No PT follow up    Equipment Recommendations  3in1 (PT)(possibly for use in shower)    Recommendations for Other Services       Precautions / Restrictions Precautions Precautions: None Restrictions Weight Bearing Restrictions: No      Mobility  Bed Mobility Overal bed mobility: Modified Independent                Transfers Overall transfer level: Independent                  Ambulation/Gait Ambulation/Gait assistance: Modified independent (Device/Increase time) Ambulation Distance (Feet): 300 Feet Assistive device: None Gait Pattern/deviations: WFL(Within Functional Limits)   Gait velocity interpretation: >2.62 ft/sec, indicative of community ambulatory General Gait Details: Pt with steady gait.  Stairs            Wheelchair Mobility    Modified Rankin (Stroke Patients Only)       Balance Overall balance assessment: No apparent balance deficits (not formally assessed)                                           Pertinent Vitals/Pain Pain Assessment: Faces Faces Pain Scale: No hurt    Home Living Family/patient expects to be discharged to:: Private residence Living Arrangements: Alone Available Help at Discharge: Family;Available PRN/intermittently Type of Home: Apartment Home Access: Stairs to enter   Entrance Stairs-Number of Steps: 2   Home Equipment: None      Prior Function Level of Independence: Independent               Hand Dominance        Extremity/Trunk Assessment   Upper Extremity Assessment Upper Extremity  Assessment: Overall WFL for tasks assessed    Lower Extremity Assessment Lower Extremity Assessment: Overall WFL for tasks assessed       Communication   Communication: No difficulties  Cognition Arousal/Alertness: Awake/alert Behavior During Therapy: WFL for tasks assessed/performed Overall Cognitive Status: Within Functional Limits for tasks assessed                                        General Comments      Exercises     Assessment/Plan    PT Assessment Patent does not need any further PT services  PT Problem List         PT Treatment Interventions      PT Goals (Current goals can be found in the Care Plan section)  Acute Rehab PT Goals PT Goal Formulation: All assessment and education complete, DC therapy    Frequency     Barriers to discharge        Co-evaluation               AM-PAC PT "6 Clicks" Daily Activity  Outcome Measure Difficulty turning over in bed (including adjusting bedclothes, sheets and blankets)?: None Difficulty moving from lying on back to  sitting on the side of the bed? : None Difficulty sitting down on and standing up from a chair with arms (e.g., wheelchair, bedside commode, etc,.)?: None Help needed moving to and from a bed to chair (including a wheelchair)?: None Help needed walking in hospital room?: None Help needed climbing 3-5 steps with a railing? : None 6 Click Score: 24    End of Session   Activity Tolerance: Patient tolerated treatment well Patient left: in bed;with call bell/phone within reach;with family/visitor present Nurse Communication: Mobility status PT Visit Diagnosis: Other abnormalities of gait and mobility (R26.89)    Time: 9539-6728 PT Time Calculation (min) (ACUTE ONLY): 12 min   Charges:   PT Evaluation $PT Eval Low Complexity: 1 Low     PT G CodesMarland Kitchen        [facility] PT [place] 05/06/2018, 9:23 AM

## 2018-05-06 NOTE — Progress Notes (Addendum)
  Progress Note    05/06/2018 7:30 AM 1 Day Post-Op  Subjective:  Some burning in L forearm overnight however this has resolved.  She denies symptoms of steal in L hand   Vitals:   05/06/18 0046 05/06/18 0548  BP: (!) 128/92 128/69  Pulse: (!) 43 96  Resp: (!) 22 20  Temp: 98.3 F (36.8 C) 98.2 F (36.8 C)  SpO2: 97% 100%   Physical Exam: Lungs:  Non labored on RA Incisions:  L AC fossa incision with some local ecchymosis, no hematoma, no drainage Extremities:  Palpable L radial, palpable thrill near [facility] fossa, audible bruit to mid upper arm Abdomen:  Soft Neurologic: A&O  CBC    Component Value Date/Time   WBC 18.1 (H) 05/05/2018 1413   RBC 3.74 (L) 05/05/2018 1413   HGB 10.0 (L) 05/05/2018 1413   HGB 10.9 (L) 07/07/2017 1235   HCT 32.0 (L) 05/05/2018 1413   HCT 35.0 07/07/2017 1235   PLT 146 (L) 05/05/2018 1413   PLT 275 07/07/2017 1235   MCV 85.6 05/05/2018 1413   MCV 85.2 07/07/2017 1235   MCH 26.7 05/05/2018 1413   MCHC 31.3 05/05/2018 1413   RDW 16.8 (H) 05/05/2018 1413   RDW 15.8 (H) 07/07/2017 1235   LYMPHSABS 1.1 04/17/2018 1348   LYMPHSABS 2.4 07/07/2017 1235   MONOABS 0.7 04/17/2018 1348   MONOABS 0.8 07/07/2017 1235   EOSABS 0.0 04/17/2018 1348   EOSABS 0.2 07/07/2017 1235   BASOSABS 0.0 04/17/2018 1348   BASOSABS 0.0 07/07/2017 1235    BMET    Component Value Date/Time   NA 135 05/06/2018 0424   NA 138 07/07/2017 1235   K 3.8 05/06/2018 0424   K 4.5 07/07/2017 1235   CL 99 (L) 05/06/2018 0424   CL 111 (H) 12/04/2012 1056   CO2 25 05/06/2018 0424   CO2 17 (L) 07/07/2017 1235   GLUCOSE 131 (H) 05/06/2018 0424   GLUCOSE 85 07/07/2017 1235   GLUCOSE 97 12/04/2012 1056   BUN 30 (H) 05/06/2018 0424   BUN 40.8 (H) 07/07/2017 1235   CREATININE 6.99 (H) 05/06/2018 0424   CREATININE 3.4 (HH) 07/07/2017 1235   CALCIUM 8.5 (L) 05/06/2018 0424   CALCIUM 9.5 07/07/2017 1235   GFRNONAA 6 (L) 05/06/2018 0424   GFRAA 7 (L) 05/06/2018 0424     INR    Component Value Date/Time   INR 1.29 04/29/2018 1653     Intake/Output Summary (Last 24 hours) at 05/06/2018 0730 Last data filed at 05/05/2018 1630 Gross per 24 hour  Intake 640 ml  Output 1750 ml  Net -1110 ml     Assessment/Plan:  62 y.o. female is s/p L brachiocephalic fistula 1 Day Post-Op   Patent fistula with thrill near [facility] fossa Continue HD from R IJ TDC Fistula duplex in 4-6 weeks in office [facility] for discharge from vascular surgery standpoint  Dagoberto Ligas, PA-C [facility] [phone] 05/06/2018 7:30 AM  I have independently interviewed and examined patient and agree with PA assessment and plan above.  This fistula may need to be elevated in the future we will reevaluate in 4 to 6 weeks with duplex.  Charlei Ramsaran C. Donzetta Matters, MD [facility] [place] Office: [phone] Pager: [phone]

## 2018-05-06 NOTE — Progress Notes (Signed)
[facility]                                                                              Patient Demographics  Whitney Walker, is a 62 y.o. female, DOB - 1956-05-12, GGY:[id number]  Admit date - 04/29/2018   Admitting Physician Whitney Girt, DO  Outpatient Primary MD for the patient is Whitney Chard, MD  Outpatient specialists:   LOS - 7  days   Medical records reviewed and are as summarized below:    Chief Complaint  Patient presents with  . Weakness       Brief summary   62 year old female patient, lives alone, independent, PMH of HTN, CKD (Whitney. Justin Walker), DVT/PE on Eliquis, CAD, chronic diastolic CHF, PAD, anemia, tobacco abuse, COPD, anxiety and depression was sent by her PCP to ED on 04/29/2018 due to weakness, decreased appetite, dyspnea, confusion, itching and worsening labs over 2 weeks (creatinine increased from 9-15 and potassium from 5.7-6.2, hemoglobin dropped from 8.8-7).  She was admitted for new ESRD with uremia.  Nephrology was consulted and HD initiated this admission.  Vascular surgery placed [facility] 5/3 and performed left arm brachiocephalic AV fistula 5/7.  Awaiting CLIP and nephrology clearance prior to DC.  PT consulted.      Assessment & Plan    Principal Problem:   ESRD (end stage renal disease) ([place]) -Patient presented with hyperkalemia, uremia with volume overload, metabolic acidosis, encephalopathy secondary to uremia.  Likely progression of chronic kidney disease. -Nephrology was consulted, temporary IJ HD cath placed by CCM and hemodialysis was started on the night of admission. -Vascular surgery changed to temporary HD catheter to [facility] on 5/3.  Eliquis was held and patient underwent left arm brachiocephalic AV fistula on 5/7. -Patient is now accepted to [place] [facility], MWF at 1245, first treatment on 5/10 at 63 AM.  Patient states that she does not have transportation for the HD, social work consult placed -Cleared from  nephrology and vascular standpoint   Active Problems: History of recurrent DVT, PE -Patient was on Eliquis PTA, was switched to IV heparin for vascular procedures, AV fistula placed on 5/7 -Patient transitioned to warfarin starting today.  Will need hematology, Whitney Walker or PCP to manage Coumadin  Hypokalemia, hypomagnesemia -Currently stable  Anemia of chronic disease/ESRD Currently H&H stable, transfused 2 units packed RBC on 5/2 Continue Aranesp     Leucocytosis -Unclear etiology, no clear foci of infection - Patient has been following Whitney. Burr Walker, chronic leukocytosis, BCR/ABL Jak 2 mutation negative, no evidence of myeloproliferative disorder -Trending down  Right earache -Continue Ciprodex eardrops for 1 week, outpatient follow-up with Whitney. Benjamine Walker  Essential hypertension Currently stable, expected to improve with dialysis   OSA -Continue CPAP   Nicotine abuse - counseled   hiradenitis of pubic area Continue doxycycline course for 1 week, started 5/5   Code Status: Full code DVT Prophylaxis: IV heparin with warfarin Family Communication: Discussed in detail with the patient, all imaging results, lab results explained to the patient and daughter   Disposition Plan: Pending PT evaluation, warfarin started  Time Spent in minutes 35 minutes  Procedures:  Temporary RIJ HD by CCM on 5/1 Removal of temporary dialysis catheter and placement of tunneled HD 5/3 HD left arm brachiocephalic AV fistula 5/7    Consultants:   CCM Vascular surgery Nephrology  Antimicrobials:   Doxycycline 5/5>   Medications  Scheduled Meds: . calcitRIOL  0.5 mcg Oral QODAY  . ciprofloxacin-dexamethasone  4 drop Right EAR BID  . darbepoetin (ARANESP) injection - DIALYSIS  200 mcg Intravenous Q Thu-HD  . doxycycline  100 mg Oral BID  . multivitamin  1 tablet Oral QHS  . mupirocin cream   Topical BID  . pantoprazole  40 mg Oral Daily  . pramipexole  0.125 mg Oral TID  . warfarin   7.5 mg Oral q1800  . Warfarin - Pharmacist Dosing Inpatient   Does not apply q1800   Continuous Infusions: . sodium chloride    . sodium chloride     PRN Meds:.sodium chloride, sodium chloride, acetaminophen **OR** acetaminophen, albuterol, alteplase, camphor-menthol, heparin, heparin, lidocaine (PF), lidocaine-prilocaine, ondansetron **OR** ondansetron (ZOFRAN) IV, pentafluoroprop-tetrafluoroeth, promethazine, zolpidem   Antibiotics   Anti-infectives (From admission, onward)   Start     Dose/Rate Route Frequency Ordered Stop   05/05/18 0839  ceFAZolin (ANCEF) IVPB 2g/100 mL premix  Status:  Discontinued     2 g 200 mL/hr over 30 Minutes Intravenous 30 min pre-op 05/05/18 0839 05/05/18 1142   05/05/18 0830  ceFAZolin (ANCEF) IVPB 2g/100 mL premix     2 g 200 mL/hr over 30 Minutes Intravenous On call to O.R. 05/04/18 0740 05/05/18 0950   05/03/18 1000  doxycycline (VIBRA-TABS) tablet 100 mg     100 mg Oral 2 times daily 05/03/18 0909 05/08/18 2359   04/30/18 1049  ceFAZolin (ANCEF) 2-4 GM/100ML-% IVPB    Note to Pharmacy:  Grace Blight   : cabinet override      04/30/18 1049 04/30/18 1100        Subjective:   Whitney Walker was seen and examined today.  Multiple questions from the patient and the daughter at the bedside.  Upset for not receiving PT, problems with transportation for HD outpatient.  Patient denies dizziness, chest pain, shortness of breath, abdominal pain, N/V/D/C, new weakness, numbess, tingling. No acute events overnight.    Objective:   Vitals:   05/05/18 2027 05/06/18 0046 05/06/18 0548 05/06/18 0857  BP: 110/65 (!) 128/92 128/69 131/71  Pulse: 86 (!) 43 96 92  Resp: 18 (!) 22 20 19   Temp: 98.9 F (37.2 C) 98.3 F (36.8 C) 98.2 F (36.8 C) 98.2 F (36.8 C)  TempSrc: Oral Oral Oral Oral  SpO2: 95% 97% 100% 97%  Weight:      Height:        Intake/Output Summary (Last 24 hours) at 05/06/2018 1113 Last data filed at 05/05/2018 1630 Gross per 24 hour    Intake 240 ml  Output 1750 ml  Net -1510 ml     Wt Readings from Last 3 Encounters:  05/05/18 87.1 kg (192 lb 0.3 oz)  04/17/18 91.4 kg (201 lb 6.4 oz)  04/10/18 90.3 kg (199 lb)     Exam  General: Alert and oriented x 3, NAD  Eyes:  HEENT:   Cardiovascular: S1 S2 auscultated, no rubs, murmurs or gallops. Regular rate and rhythm.  Respiratory: Clear to auscultation bilaterally, no wheezing, rales or rhonchi  Gastrointestinal: Soft, nontender, nondistended, + bowel sounds  Ext: no pedal edema bilaterally, left forearm ecchymosis but no hematoma or drainage.  Neuro:  Strength 5/5  upper and lower extremities bilaterally, speech clear,  Musculoskeletal: No digital cyanosis, clubbing  Skin: No rashes  Psych: Normal affect and demeanor, alert and oriented x3    Data Reviewed:  I have personally reviewed following labs and imaging studies  Micro Results Recent Results (from the past 240 hour(s))  MRSA PCR Screening     Status: None   Collection Time: 05/04/18  9:43 PM  Result Value Ref Range Status   MRSA by PCR NEGATIVE NEGATIVE Final    Comment:        The GeneXpert MRSA Assay (FDA approved for NASAL specimens only), is one component of a comprehensive MRSA colonization surveillance program. It is not intended to diagnose MRSA infection nor to guide or monitor treatment for MRSA infections. Performed at [facility], [place] [address], [place], [place] [address]     Radiology Reports Dg Chest 2 View  Result Date: 04/29/2018 CLINICAL DATA:  Two weeks of shortness of breath. History of CHF, COPD, chronic renal insufficiency stage IV, colonic malignancy, peripheral vascular disease, current smoker. EXAM: CHEST - 2 VIEW COMPARISON:  PA and lateral chest x-ray of March 02, 2017 FINDINGS: The lungs are mildly hyperinflated. The cardiac silhouette is enlarged. The pulmonary vascularity is mildly engorged. The interstitial markings are increased and more  conspicuous than on the previous study. There is no pleural effusion. The bony thorax exhibits no acute abnormality. IMPRESSION: COPD.  Superimposed low-grade CHF.  No alveolar pneumonia. Electronically Signed   By: David  Martinique M.D.   On: 04/29/2018 14:08   Dg Chest Port 1 View  Result Date: 04/30/2018 CLINICAL DATA:  Dialysis cath insertion. EXAM: PORTABLE CHEST 1 VIEW COMPARISON:  04/29/2018. FINDINGS: Dialysis catheter noted with its tip over the upper right atrium. Cardiomegaly with normal pulmonary vascularity. Mild left base atelectasis/infiltrate. Small left pleural effusion. No acute bony abnormality. IMPRESSION: Phthisis catheter with its tip over the upper right atrium. No pneumothorax. 2.  Cardiomegaly.  No pulmonary venous congestion. 3. Mild left base atelectasis/infiltrate with small left pleural effusion. Electronically Signed   By: Marcello Moores  Register   On: 04/30/2018 12:54   Dg Chest Portable 1 View  Result Date: 04/29/2018 CLINICAL DATA:  Right-sided hemodialysis catheter placement. EXAM: PORTABLE CHEST 1 VIEW COMPARISON:  04/29/2018 FINDINGS: 1843 hours. Right IJ central catheter tip overlies the mid SVC. No right pneumothorax. No evidence for right pleural effusion. The cardio pericardial silhouette is enlarged. Interstitial markings are diffusely coarsened with chronic features. The visualized bony structures of the thorax are intact. Telemetry leads overlie the chest. IMPRESSION: Right IJ central line tip overlies the mid SVC. No pneumothorax or pleural effusion. Electronically Signed   By: [name] Stanley M.D.   On: 04/29/2018 19:05    Lab Data:  CBC: Recent Labs  Lab 05/02/18 0342 05/03/18 0311 05/04/18 0303 05/05/18 0339 05/05/18 1413  WBC 21.6* 28.0* 23.6* 24.1* 18.1*  HGB 10.1* 10.8* 10.8* 10.7* 10.0*  HCT 30.8* 33.0* 34.1* 33.2* 32.0*  MCV 80.2 81.7 84.0 85.1 85.6  PLT 202 152 153 154 277*   Basic Metabolic Panel: Recent Labs  Lab 05/01/18 0036 05/02/18 0342  05/03/18 0311 05/04/18 0303 05/05/18 0339 05/05/18 1344 05/06/18 0424  NA 142 139 135 134* 133* 132* 135  K 3.3* 2.9* 2.8* 3.2* 3.4* 3.8 3.8  CL 102 102 97* 100* 98* 100* 99*  CO2 19* 21* 24 21* 21* 20* 25  GLUCOSE 115* 105* 94 140* 97 132* 131*  BUN 44* 52* 27* 32*  45* 47* 30*  CREATININE 5.71* 7.83* 5.90* 7.85* 10.04* 9.76* 6.99*  CALCIUM 7.7* 8.1* 8.1* 8.7* 9.0 8.7* 8.5*  MG 1.6* 1.6* 1.7 1.7 1.8  --   --   PHOS 4.7* 4.8* 3.3 3.2 4.2 4.6 3.5   GFR: Estimated Creatinine Clearance: 8.7 mL/min (A) (by C-G formula based on SCr of 6.99 mg/dL (H)). Liver Function Tests: Recent Labs  Lab 04/29/18 1653 04/29/18 2000  05/03/18 0311 05/04/18 0303 05/05/18 0339 05/05/18 1344 05/06/18 0424  AST 9*  --   --   --   --   --   --   --   ALT 11* 10*  --   --   --   --   --   --   ALKPHOS 71  --   --   --   --   --   --   --   BILITOT 0.6  --   --   --   --   --   --   --   PROT 7.0  --   --   --   --   --   --   --   ALBUMIN 2.9*  --    < > 3.0* 2.9* 2.8* 2.7* 2.8*   < > = values in this interval not displayed.   No results for input(s): LIPASE, AMYLASE in the last 168 hours. No results for input(s): AMMONIA in the last 168 hours. Coagulation Profile: Recent Labs  Lab 04/29/18 1653  INR 1.29   Cardiac Enzymes: No results for input(s): CKTOTAL, CKMB, CKMBINDEX, TROPONINI in the last 168 hours. BNP (last 3 results) No results for input(s): PROBNP in the last 8760 hours. HbA1C: No results for input(s): HGBA1C in the last 72 hours. CBG: Recent Labs  Lab 04/30/18 2308 05/02/18 1642  GLUCAP 124* 121*   Lipid Profile: No results for input(s): CHOL, HDL, LDLCALC, TRIG, CHOLHDL, LDLDIRECT in the last 72 hours. Thyroid Function Tests: No results for input(s): TSH, T4TOTAL, FREET4, T3FREE, THYROIDAB in the last 72 hours. Anemia Panel: No results for input(s): VITAMINB12, FOLATE, FERRITIN, TIBC, IRON, RETICCTPCT in the last 72 hours. Urine analysis:    Component Value  Date/Time   COLORURINE YELLOW 03/03/2017 0919   APPEARANCEUR HAZY (A) 03/03/2017 0919   LABSPEC 1.013 03/03/2017 0919   PHURINE 5.0 03/03/2017 0919   GLUCOSEU NEGATIVE 03/03/2017 0919   HGBUR SMALL (A) 03/03/2017 0919   BILIRUBINUR NEGATIVE 03/03/2017 0919   KETONESUR NEGATIVE 03/03/2017 0919   PROTEINUR >=300 (A) 03/03/2017 0919   UROBILINOGEN 0.2 10/19/2015 1532   NITRITE NEGATIVE 03/03/2017 0919   LEUKOCYTESUR NEGATIVE 03/03/2017 0919     Ripudeep Rai M.D. [facility] 05/06/2018, 11:13 AM  Pager: [phone] Between 7am to 7pm - call Pager - [phone]  After 7pm go to [email or web address] - password [email or web address]  Call night coverage person covering after 7pm

## 2018-05-06 NOTE — Progress Notes (Signed)
[facility] Progress Note    Assessment/ Plan:   62 yo female with CKD V, morbid obesity, hypertension, congestive heart failure, CAD, COPD, reported history of colon cancer and depression. She has been followed by Dr. [name] for advanced CKD, last seen on 04/14/18, with plans for outpatient fistula placement on 05/01/18. Admitted on 04/29/18 for uremia due to worsening kidney function now doing well after starting HD.  1. ESRD s/p TDC placement and LUE AVF. Received HD yesterday, next on Friday. Accepted at [facility] for MWF.  2. Hypokalemia, resolved. K 3.8.  3. Secondary hyperparathyroidism. PTH 382. On calcitriol 0.74mcg every other day 4. HTN, controlled on HD. 5. Elevated phosphorous, resolved on HD.  6. Anemia due to CKD. s/p 2U RBC on 04/30/18, now Hgb stable at 10.0. On aranesp 210mcg weekly.  7. Obesity  8. Low mag, resolved. 9. Hidradenitis, on doxycycline per primary. Stable for DC from nephrology. Patient scheduled for HD as outpatient on Friday.  Subjective:   States overall feels well, ready to go home. She did have some pain of L arm yesterday but that has resolved today. No new complaints today.    Objective:   BP 128/69 (BP Location: Right Arm)   Pulse 96   Temp 98.2 F (36.8 C) (Oral)   Resp 20   Ht 5' 2.99" (1.6 m)   Wt 192 lb 0.3 oz (87.1 kg)   SpO2 100%   BMI 34.02 kg/m   Intake/Output Summary (Last 24 hours) at 05/06/2018 0754 Last data filed at 05/05/2018 1630 Gross per 24 hour  Intake 640 ml  Output 1750 ml  Net -1110 ml   Weight change: 5 lb 8.3 oz (2.504 kg)  Physical Exam: Gen: laying bed, in NAD.  CVS: regular, S1 and S2 normal, grade II systolic murmur.  Resp: CTAB with good air movement. Normal effort on room air Abd: obese, soft, nontender, nondistended, no hepatomegaly Ext: no edema. L UE with palpable thrill over fistula, incision with some minimal ecchymosis. No drainage.   Imaging: No results found.  Labs: BMET Recent  Labs  Lab 05/01/18 0036 05/02/18 0342 05/03/18 0311 05/04/18 0303 05/05/18 0339 05/05/18 1344 05/06/18 0424  NA 142 139 135 134* 133* 132* 135  K 3.3* 2.9* 2.8* 3.2* 3.4* 3.8 3.8  CL 102 102 97* 100* 98* 100* 99*  CO2 19* 21* 24 21* 21* 20* 25  GLUCOSE 115* 105* 94 140* 97 132* 131*  BUN 44* 52* 27* 32* 45* 47* 30*  CREATININE 5.71* 7.83* 5.90* 7.85* 10.04* 9.76* 6.99*  CALCIUM 7.7* 8.1* 8.1* 8.7* 9.0 8.7* 8.5*  PHOS 4.7* 4.8* 3.3 3.2 4.2 4.6 3.5   CBC Recent Labs  Lab 05/03/18 0311 05/04/18 0303 05/05/18 0339 05/05/18 1413  WBC 28.0* 23.6* 24.1* 18.1*  HGB 10.8* 10.8* 10.7* 10.0*  HCT 33.0* 34.1* 33.2* 32.0*  MCV 81.7 84.0 85.1 85.6  PLT 152 153 154 146*    Medications:    . calcitRIOL  0.5 mcg Oral QODAY  . ciprofloxacin-dexamethasone  4 drop Right EAR BID  . darbepoetin (ARANESP) injection - DIALYSIS  200 mcg Intravenous Q Thu-HD  . doxycycline  100 mg Oral BID  . magnesium oxide  800 mg Oral BID  . multivitamin  1 tablet Oral QHS  . mupirocin cream   Topical BID  . pantoprazole  40 mg Oral Daily  . pramipexole  0.125 mg Oral TID      Bufford Lope, DO PGY-2, [facility] 05/06/2018 7:54 AM

## 2018-05-06 NOTE — Care Management Important Message (Signed)
Important Message  Patient Details  Name: Whitney Walker MRN: [id number] Date of Birth: 08/02/1956   Medicare Important Message Given:  Yes    Desani Sprung P Markeia Harkless 05/06/2018, 3:51 PM

## 2018-05-06 NOTE — Discharge Instructions (Signed)
[facility]  Discharge Instructions  AV Fistula or G[facility] Surgery for Dialysis Access  Please refer to the following instructions for your post-procedure care. Your surgeon or physician assistant will discuss any changes with you.  Activity  You may drive the day following your surgery, if you are comfortable and no longer taking prescription pain medication. Resume full activity as the soreness in your incision resolves.  Bathing/Showering  You may shower after you go home. Keep your incision dry for 48 hours. Do not soak in a bathtub, hot tub, or swim until the incision heals completely. You may not shower if you have a hemodialysis catheter.  Incision Care  Clean your incision with mild soap and water after 48 hours. Pat the area dry with a clean towel. You do not need a bandage unless otherwise instructed. Do not apply any ointments or creams to your incision. You may have skin glue on your incision. Do not peel it off. It will come off on its own in about one week. Your arm may swell a bit after surgery. To reduce swelling use pillows to elevate your arm so it is above your heart. Your doctor will tell you if you need to lightly wrap your arm with an ACE bandage.  Diet  Resume your normal diet. There are not special food restrictions following this procedure. In order to heal from your surgery, it is CRITICAL to get adequate nutrition. Your body requires vitamins, minerals, and protein. Vegetables are the best source of vitamins and minerals. Vegetables also provide the perfect balance of protein. Processed food has little nutritional value, so try to avoid this.  Medications  Resume taking all of your medications. If your incision is causing pain, you may take over-the counter pain relievers such as acetaminophen (Tylenol). If you were prescribed a stronger pain medication, please be aware these medications can cause nausea and constipation. Prevent  nausea by taking the medication with a snack or meal. Avoid constipation by drinking plenty of fluids and eating foods with high amount of fiber, such as fruits, vegetables, and grains. Do not take Tylenol if you are taking prescription pain medications.     Follow up Your surgeon may want to see you in the office following your access surgery. If so, this will be arranged at the time of your surgery.  Please call us immediately for any of the following conditions:  Increased pain, redness, drainage (pus) from your incision site Fever of 101 degrees or higher Severe or worsening pain at your incision site Hand pain or numbness.  Reduce your risk of vascular disease:  Stop smoking. If you would like help, call [facility] at 1-800-QUIT-NOW 8050031905) or [place] [facility] [place] your cholesterol Maintain a desired weight Control your diabetes Keep your blood pressure down  Dialysis  It will take several weeks to several months for your new dialysis access to be ready for use. Your surgeon will determine when it is OK to use it. Your nephrologist will continue to direct your dialysis. You can continue to use your Permcath until your new access is ready for use.  If you have any questions, please call the office at [phone].  Information on my medicine - Coumadin   (Warfarin)   Why was Coumadin prescribed for you? Coumadin was prescribed for you because you have a blood clot or a medical condition that can cause an increased risk of forming blood clots. Blood clots can cause  serious health problems by blocking the flow of blood to the heart, lung, or brain. Coumadin can prevent harmful blood clots from forming. As a reminder your indication for Coumadin is:   Blood Clotting Disorder  What test will check on my response to Coumadin? While on Coumadin (warfarin) you will need to have an INR test regularly to ensure that your dose is keeping you in the desired  range. The INR (international normalized ratio) number is calculated from the result of the laboratory test called prothrombin time (PT).  If an INR APPOINTMENT HAS NOT ALREADY BEEN MADE FOR YOU please schedule an appointment to have this lab work done by your health care provider within 7 days. Your INR goal is usually a number between:  2 to 3 or your provider may give you a more narrow range like 2-2.5.  Ask your health care provider during an office visit what your goal INR is.  What  do you need to  know  About  COUMADIN? Take Coumadin (warfarin) exactly as prescribed by your healthcare provider about the same time each day.  DO NOT stop taking without talking to the doctor who prescribed the medication.  Stopping without other blood clot prevention medication to take the place of Coumadin may increase your risk of developing a new clot or stroke.  Get refills before you run out.  What do you do if you miss a dose? If you miss a dose, take it as soon as you remember on the same day then continue your regularly scheduled regimen the next day.  Do not take two doses of Coumadin at the same time.  Important Safety Information A possible side effect of Coumadin (Warfarin) is an increased risk of bleeding. You should call your healthcare provider right away if you experience any of the following: ? Bleeding from an injury or your nose that does not stop. ? Unusual colored urine (red or dark brown) or unusual colored stools (red or black). ? Unusual bruising for unknown reasons. ? A serious fall or if you hit your head (even if there is no bleeding).  Some foods or medicines interact with Coumadin (warfarin) and might alter your response to warfarin. To help avoid this: ? Eat a balanced diet, maintaining a consistent amount of Vitamin K. ? Notify your provider about major diet changes you plan to make. ? Avoid alcohol or limit your intake to 1 drink for women and 2 drinks for men per day. (1  drink is 5 oz. wine, 12 oz. beer, or 1.5 oz. liquor.)  Make sure that ANY health care provider who prescribes medication for you knows that you are taking Coumadin (warfarin).  Also make sure the healthcare provider who is monitoring your Coumadin knows when you have started a new medication including herbals and non-prescription products.  Coumadin (Warfarin)  Major Drug Interactions  Increased Warfarin Effect Decreased Warfarin Effect  Alcohol (large quantities) Antibiotics (esp. Septra/Bactrim, Flagyl, Cipro) Amiodarone (Cordarone) Aspirin (ASA) Cimetidine (Tagamet) Megestrol (Megace) NSAIDs (ibuprofen, naproxen, etc.) Piroxicam (Feldene) Propafenone (Rythmol SR) Propranolol (Inderal) Isoniazid (INH) Posaconazole (Noxafil) Barbiturates (Phenobarbital) Carbamazepine (Tegretol) Chlordiazepoxide (Librium) Cholestyramine (Questran) Griseofulvin Oral Contraceptives Rifampin Sucralfate (Carafate) Vitamin K   Coumadin (Warfarin) Major Herbal Interactions  Increased Warfarin Effect Decreased Warfarin Effect  Garlic Ginseng Ginkgo biloba Coenzyme Q10 Green tea St. Johns wort    Coumadin (Warfarin) FOOD Interactions  Eat a consistent number of servings per week of foods HIGH in Vitamin K (1 serving =  cup)  Collards (cooked, or boiled & drained) Kale (cooked, or boiled & drained) Mustard greens (cooked, or boiled & drained) Parsley *serving size only =  cup Spinach (cooked, or boiled & drained) Swiss chard (cooked, or boiled & drained) Turnip greens (cooked, or boiled & drained)  Eat a consistent number of servings per week of foods MEDIUM-HIGH in Vitamin K (1 serving = 1 cup)  Asparagus (cooked, or boiled & drained) Broccoli (cooked, boiled & drained, or raw & chopped) Brussel sprouts (cooked, or boiled & drained) *serving size only =  cup Lettuce, raw (green leaf, endive, romaine) Spinach, raw Turnip greens, raw & chopped   These websites have more information  on Coumadin (warfarin):  FailFactory.se; VeganReport.com.au;

## 2018-05-06 NOTE — Telephone Encounter (Signed)
Sched lab 06/15/18 at 4:00 and PA 06/17/18 at 1:00. Lm on hm# to inform pt of appt

## 2018-05-07 ENCOUNTER — Other Ambulatory Visit: Payer: Self-pay

## 2018-05-07 DIAGNOSIS — N184 Chronic kidney disease, stage 4 (severe): Secondary | ICD-10-CM

## 2018-05-07 DIAGNOSIS — Z48812 Encounter for surgical aftercare following surgery on the circulatory system: Secondary | ICD-10-CM

## 2018-05-07 LAB — RENAL FUNCTION PANEL
Albumin: 2.7 g/dL — ABNORMAL LOW (ref 3.5–5.0)
Anion gap: 13 (ref 5–15)
BUN: 48 mg/dL — ABNORMAL HIGH (ref 6–20)
CO2: 22 mmol/L (ref 22–32)
Calcium: 8.8 mg/dL — ABNORMAL LOW (ref 8.9–10.3)
Chloride: 98 mmol/L — ABNORMAL LOW (ref 101–111)
Creatinine, Ser: 8.95 mg/dL — ABNORMAL HIGH (ref 0.44–1.00)
GFR calc Af Amer: 5 mL/min — ABNORMAL LOW (ref >60)
GFR calc non Af Amer: 4 mL/min — ABNORMAL LOW (ref >60)
Glucose, Bld: 129 mg/dL — ABNORMAL HIGH (ref 65–99)
Phosphorus: 4.7 mg/dL — ABNORMAL HIGH (ref 2.5–4.6)
Potassium: 3.5 mmol/L (ref 3.5–5.1)
Sodium: 133 mmol/L — ABNORMAL LOW (ref 135–145)

## 2018-05-07 LAB — CBC
HCT: 31.7 % — ABNORMAL LOW (ref 36.0–46.0)
Hemoglobin: 10 g/dL — ABNORMAL LOW (ref 12.0–15.0)
MCH: 27.4 pg (ref 26.0–34.0)
MCHC: 31.5 g/dL (ref 30.0–36.0)
MCV: 86.8 fL (ref 78.0–100.0)
Platelets: 102 10*3/uL — ABNORMAL LOW (ref 150–400)
RBC: 3.65 MIL/uL — ABNORMAL LOW (ref 3.87–5.11)
RDW: 17.3 % — ABNORMAL HIGH (ref 11.5–15.5)
WBC: 18.8 10*3/uL — ABNORMAL HIGH (ref 4.0–10.5)

## 2018-05-07 LAB — PROTIME-INR
INR: 1.19
Prothrombin Time: 15 seconds (ref 11.4–15.2)

## 2018-05-07 LAB — HEPATITIS B CORE ANTIBODY, TOTAL: Hep B Core Total Ab: NEGATIVE

## 2018-05-07 LAB — HEPATITIS B SURFACE ANTIBODY,QUALITATIVE: Hep B S Ab: REACTIVE

## 2018-05-07 MED ORDER — DOXYCYCLINE HYCLATE 100 MG PO TABS: 100.0000 mg | ORAL_TABLET | Freq: Two times a day (BID) | ORAL | 0 refills | Status: AC

## 2018-05-07 MED ORDER — RENA-VITE PO TABS: 1.0000 | ORAL_TABLET | Freq: Every day | ORAL | 3 refills | Status: DC

## 2018-05-07 MED ORDER — CIPROFLOXACIN-DEXAMETHASONE 0.3-0.1 % OT SUSP: 4.0000 [drp] | Freq: Two times a day (BID) | OTIC | 0 refills | Status: DC

## 2018-05-07 MED ORDER — APIXABAN 2.5 MG PO TABS: 2.5000 mg | ORAL_TABLET | Freq: Two times a day (BID) | ORAL | 5 refills | Status: DC

## 2018-05-07 MED ORDER — PROMETHAZINE HCL 12.5 MG PO TABS: 12.5000 mg | ORAL_TABLET | Freq: Four times a day (QID) | ORAL | 0 refills | Status: DC | PRN

## 2018-05-07 MED ORDER — APIXABAN 2.5 MG PO TABS: 2.5000 mg | ORAL_TABLET | Freq: Two times a day (BID) | ORAL | Status: DC

## 2018-05-07 MED ORDER — CALCITRIOL 0.5 MCG PO CAPS: 0.5000 ug | ORAL_CAPSULE | ORAL | 2 refills | Status: DC

## 2018-05-07 MED ORDER — ENOXAPARIN SODIUM 80 MG/0.8ML ~~LOC~~ SOLN
80.0000 mg | SUBCUTANEOUS | Status: DC
  Filled 2018-05-07: qty 0.8

## 2018-05-07 MED ORDER — PRAMIPEXOLE DIHYDROCHLORIDE 0.125 MG PO TABS: 0.1250 mg | ORAL_TABLET | Freq: Three times a day (TID) | ORAL | 1 refills | Status: DC

## 2018-05-07 NOTE — Progress Notes (Deleted)
[place] for Warfarin / Lovenox Indication: PE history  Allergies  Allergen Reactions  . Gadolinium Derivatives Hives and Other (See Comments)    HIVES, Desc: HIVES W/ "DYE" USED FOR 1ST CT SCAN BUT NOT 2ND, NO PREMEDS USED, PT UNCERTAIN OF CIRCUMSTANCES,,?POSSIBLE MRI CONTRAST ALLERGY, ALL STUDIES DONE "SOMEWHERE" IN [place]//A.C., Onset Date: [id number]  . Iohexol Other (See Comments)     Code: HIVES, Desc: HIVES W/ "DYE" USED FOR 1ST CT SCAN BUT NOT 2ND, NO PREMEDS USED, PT UNCERTAIN OF CIRCUMSTANCES,,?POSSIBLE MRI CONTRAST ALLERGY, ALL STUDIES DONE "SOMEWHERE" IN [place]//A.C., Onset Date: [id number]     Patient Measurements: Height: 5' 2.99" (160 cm) Weight: 186 lb 9.6 oz (84.6 kg) IBW/kg (Calculated) : 52.38  Vital Signs: Temp: 98.2 F (36.8 C) (05/09 0757) Temp Source: Oral (05/09 0757) BP: 142/75 (05/09 0757) Pulse Rate: 77 (05/09 0757)  Labs: Recent Labs    05/04/18 1149 05/04/18 2137  05/05/18 0339 05/05/18 1344 05/05/18 1413 05/06/18 0424 05/07/18 0426  HGB  --   --   --  10.7*  --  10.0*  --   --   HCT  --   --   --  33.2*  --  32.0*  --   --   PLT  --   --   --  154  --  146*  --   --   LABPROT  --   --   --   --   --   --   --  15.0  INR  --   --   --   --   --   --   --  1.19  HEPARINUNFRC 0.22* 0.51  --  0.91*  --   --   --   --   CREATININE  --   --    < > 10.04* 9.76*  --  6.99* 8.95*   < > = values in this interval not displayed.    Estimated Creatinine Clearance: 6.7 mL/min (A) (by C-G formula based on SCr of 8.95 mg/dL (H)).     Assessment: 62 year old female previously on apixaban for history of PE now to transition to Coumadin / Lovenox  INR = 1.19  Goal of Therapy:  INR 2-3 Monitor platelets by anticoagulation protocol: Yes   Plan:  Warfarin 7.5 mg po daily at 1800 pm Lovenox 80 mg sq Q 24 hours Daily INR  Thank you Anette Guarneri, PharmD [phone]  05/07/2018,8:31 AM

## 2018-05-07 NOTE — Progress Notes (Signed)
  [facility] Progress Note    Assessment/ Plan:   62 yo female with CKD V, morbid obesity, hypertension, congestive heart failure, CAD, COPD, reported history of colon cancer and depression. She has been followed by Dr. [name] for advanced CKD. Admitted on 04/29/18 for uremia due to worsening kidney function, now ESRD doing well after starting HD.  1. ESRD s/p TDC placement and LUE AVF. Received HD 5/7, next on Friday.  2. Secondary hyperparathyroidism. PTH 382. On calcitriol 0.43mcg every other day 3. HTN, controlled on HD. 4. Anemia due to CKD. s/p 2U RBC on 04/30/18, Hgb stable at 10.0. On aranesp 235mcg weekly.  5. Obesity  6. Hidradenitis, on doxycycline per primary.  7. H/o recurrent DVT/PE, starting on warfarin, INR subtherapeutic today 1.19 Stable for DC from nephrology. Patient scheduled for HD at [facility] on Friday, if she stays then will do HD inpatient tomorrow.  Subjective:   Feel overall well. Wants to go home. No new complaints today.   Objective:   BP (!) 148/85 (BP Location: Right Arm)   Pulse 92   Temp 98.8 F (37.1 C) (Oral)   Resp 15   Ht 5' 2.99" (1.6 m)   Wt 186 lb 9.6 oz (84.6 kg)   SpO2 99%   BMI 33.06 kg/m  No intake or output data in the 24 hours ending 05/07/18 0735 Weight change: -5 lb 6.7 oz (-2.459 kg)  Physical Exam: Gen: laying bed, in NAD. Awake and alert CVS: regular, S1 and S2 normal, grade II systolic murmur.  Resp: CTAB with good air movement. Normal effort on room air Abd: obese, soft, nontender, nondistended, no hepatomegaly Ext: no edema. L UE with palpable thrill over fistula. No drainage.   Imaging: No results found.  Labs: BMET Recent Labs  Lab 05/02/18 0342 05/03/18 0311 05/04/18 0303 05/05/18 0339 05/05/18 1344 05/06/18 0424 05/07/18 0426  NA 139 135 134* 133* 132* 135 133*  K 2.9* 2.8* 3.2* 3.4* 3.8 3.8 3.5  CL 102 97* 100* 98* 100* 99* 98*  CO2 21* 24 21* 21* 20* 25 22  GLUCOSE 105* 94 140* 97  132* 131* 129*  BUN 52* 27* 32* 45* 47* 30* 48*  CREATININE 7.83* 5.90* 7.85* 10.04* 9.76* 6.99* 8.95*  CALCIUM 8.1* 8.1* 8.7* 9.0 8.7* 8.5* 8.8*  PHOS 4.8* 3.3 3.2 4.2 4.6 3.5 4.7*   CBC Recent Labs  Lab 05/03/18 0311 05/04/18 0303 05/05/18 0339 05/05/18 1413  WBC 28.0* 23.6* 24.1* 18.1*  HGB 10.8* 10.8* 10.7* 10.0*  HCT 33.0* 34.1* 33.2* 32.0*  MCV 81.7 84.0 85.1 85.6  PLT 152 153 154 146*    Medications:    . calcitRIOL  0.5 mcg Oral QODAY  . ciprofloxacin-dexamethasone  4 drop Right EAR BID  . darbepoetin (ARANESP) injection - DIALYSIS  200 mcg Intravenous Q Thu-HD  . doxycycline  100 mg Oral BID  . multivitamin  1 tablet Oral QHS  . mupirocin cream   Topical BID  . pantoprazole  40 mg Oral Daily  . pramipexole  0.125 mg Oral TID  . warfarin  7.5 mg Oral q1800  . Warfarin - Pharmacist Dosing Inpatient   Does not apply [place], DO PGY-2, [facility] 05/07/2018 7:35 AM

## 2018-05-07 NOTE — Discharge Summary (Signed)
Physician Discharge Summary   Patient ID: Whitney Walker MRN: [id number] DOB/AGE: 63-21-1957 63 y.o.  Admit date: 04/29/2018 Discharge date: 05/07/2018  Primary Care Physician:  Glendale Chard, MD   Recommendations for Outpatient Follow-up:  1. Follow up with PCP in 1-2 weeks 2. Please obtain BMP/CBC in one week  Home Health: None  Equipment/Devices: DME 3N1, shower stool  Discharge Condition: stable  CODE STATUS: FULL heart healthy Diet recommendation:    Discharge Diagnoses:   ESRD, started on hemodialysis History of recurrent DVT, PE Hypokalemia, hypomagnesemia Anemia of chronic disease . Leucocytosis . Morbid (severe) obesity due to excess calories ([place]) . Pulmonary embolism (HCC) Nicotine abuse OSA Hidradenitis of pubic area  Consults: Nephrology, Dr. [name] Hematology, discussed on the phone with Dr. Burr Medico    Allergies:   Allergies  Allergen Reactions  . Gadolinium Derivatives Hives and Other (See Comments)    HIVES, Desc: HIVES W/ "DYE" USED FOR 1ST CT SCAN BUT NOT 2ND, NO PREMEDS USED, PT UNCERTAIN OF CIRCUMSTANCES,,?POSSIBLE MRI CONTRAST ALLERGY, ALL STUDIES DONE "SOMEWHERE" IN [place]//A.C., Onset Date: [id number]  . Iohexol Other (See Comments)     Code: HIVES, Desc: HIVES W/ "DYE" USED FOR 1ST CT SCAN BUT NOT 2ND, NO PREMEDS USED, PT UNCERTAIN OF CIRCUMSTANCES,,?POSSIBLE MRI CONTRAST ALLERGY, ALL STUDIES DONE "SOMEWHERE" IN [place]//A.C., Onset Date: [id number]      DISCHARGE MEDICATIONS: Allergies as of 05/07/2018      Reactions   Gadolinium Derivatives Hives, Other (See Comments)   HIVES, Desc: HIVES W/ "DYE" USED FOR 1ST CT SCAN BUT NOT 2ND, NO PREMEDS USED, PT UNCERTAIN OF CIRCUMSTANCES,,?POSSIBLE MRI CONTRAST ALLERGY, ALL STUDIES DONE "SOMEWHERE" IN [place]//A.C., Onset Date: [id number]   Iohexol Other (See Comments)    Code: HIVES, Desc: HIVES W/ "DYE" USED FOR 1ST CT SCAN BUT NOT 2ND, NO PREMEDS USED, PT UNCERTAIN OF CIRCUMSTANCES,,?POSSIBLE  MRI CONTRAST ALLERGY, ALL STUDIES DONE "SOMEWHERE" IN [place]//A.C., Onset Date: [id number]      Medication List    STOP taking these medications   amLODipine 10 MG tablet Commonly known as:  NORVASC   IRON PO   labetalol 200 MG tablet Commonly known as:  NORMODYNE     TAKE these medications   albuterol 108 (90 Base) MCG/ACT inhaler Commonly known as:  PROVENTIL HFA;VENTOLIN HFA Inhale 2 puffs into the lungs every 6 (six) hours as needed for wheezing or shortness of breath.   apixaban 2.5 MG Tabs tablet Commonly known as:  ELIQUIS Take 1 tablet (2.5 mg total) by mouth 2 (two) times daily.   calcitRIOL 0.5 MCG capsule Commonly known as:  ROCALTROL Take 1 capsule (0.5 mcg total) by mouth every other day. Start taking on:  05/09/2018   ciprofloxacin-dexamethasone OTIC suspension Commonly known as:  Bazine 4 drops into the right ear 2 (two) times daily. X 3 days   diphenhydrAMINE 25 mg capsule Commonly known as:  BENADRYL Take 25 mg by mouth daily as needed for allergies.   doxycycline 100 MG tablet Commonly known as:  VIBRA-TABS Take 1 tablet (100 mg total) by mouth 2 (two) times daily for 4 days.   multivitamin Tabs tablet Take 1 tablet by mouth at bedtime.   mupirocin ointment 2 % Commonly known as:  BACTROBAN Apply 1 application topically daily.   nystatin powder Generic drug:  nystatin Apply to affected area twice daily as needed for infection   pramipexole 0.125 MG tablet Commonly known as:  MIRAPEX Take 1 tablet (0.125 mg total) by mouth 3 (three) times  daily.   promethazine 12.5 MG tablet Commonly known as:  PHENERGAN Take 1 tablet (12.5 mg total) by mouth every 6 (six) hours as needed for nausea or vomiting.   vitamin C 1000 MG tablet Take 1,000 mg by mouth every other day.            Durable Medical Equipment  (From admission, onward)        Start     Ordered   05/07/18 1125  For home use only DME 3 n 1  Once     05/07/18 1124    05/07/18 1124  For home use only DME Shower stool  Once     05/07/18 1123       Brief H and P: For complete details please refer to admission H and P, but in brief  62 year old female patient, lives alone, independent, PMH of HTN, CKD (Dr. [name]), DVT/PE on Eliquis, CAD, chronic diastolic CHF, PAD, anemia, tobacco abuse, COPD, anxiety and depression was sent by her PCP to ED on 04/29/2018 due to weakness, decreased appetite, dyspnea, confusion, itching and worsening labs over 2 weeks (creatinine increased from 9-15 and potassium from 5.7-6.2, hemoglobin dropped from 8.8-7). She was admitted for new ESRD with uremia. Nephrology was consulted and HD initiated this admission. Vascular surgery placed [facility] 5/3 and performed left arm brachiocephalic AV fistula 5/7. Awaiting CLIPand nephrology clearance prior to DC. PT consulted.  Hospital Course:   ESRD (end stage renal disease) ([place]) -Patient presented with hyperkalemia, uremia with volume overload, metabolic acidosis, encephalopathy secondary to uremia.  Likely progression of chronic kidney disease. -Nephrology was consulted, temporary IJ HD cath placed by CCM and hemodialysis was started on the night of admission. -Vascular surgery changed to temporary HD catheter to [facility] on 5/3.  Eliquis was held and patient underwent left arm brachiocephalic AV fistula on 5/7. -Patient is now accepted to [place] [facility] dialysis center, MWF at 1245, first treatment on 5/10 at 11 AM. -Cleared from nephrology and vascular standpoint   History of recurrent DVT, PE -Patient was on Eliquis PTA, was switched to IV heparin for vascular procedures, AV fistula placed on 5/7 -Patient was transitioned to warfarin however she refused Lovenox injections for the bridge until therapeutic INR and did not want to stay in the hospital either.  Patient had been somewhat resistant with warfarin in the past as well, called Dr. Burr Medico who recommended renal dose Eliquis 2.5 mg  twice a day   Hypokalemia, hypomagnesemia -Currently stable  Anemia of chronic disease/ESRD Currently H&H stable, transfused 2 units packed RBC on 5/2 Continue Aranesp    Leucocytosis -Unclear etiology, no clear foci of infection - Patient has been following Dr. Burr Medico, chronic leukocytosis, BCR/ABL Jak 2 mutation negative, no evidence of myeloproliferative disorder -Plateaued at 69, outpatient follow-up with CBC, BMET  Right earache -Continue Ciprodex eardrops for 1 week, outpatient follow-up with Dr. [name]  Essential hypertension Currently stable, expected to improve with dialysis   OSA -Continue CPAP   Nicotine abuse - counseled   hiradenitis of pubic area Continue doxycycline course for 1 week, started 5/5   Day of Discharge S: Wants to go home however upset over Lovenox injections.  No other acute complaints, no pain.  BP (!) 142/75 (BP Location: Right Arm)   Pulse 77   Temp 98.2 F (36.8 C) (Oral)   Resp 10   Ht 5' 2.99" (1.6 m)   Wt 84.6 kg (186 lb 9.6 oz)   SpO2 99%  BMI 33.06 kg/m   Physical Exam: General: Alert and awake oriented x3 not in any acute distress. HEENT: anicteric sclera, pupils reactive to light and accommodation CVS: S1-S2 clear no murmur rubs or gallops Chest: clear to auscultation bilaterally, no wheezing rales or rhonchi Abdomen: soft nontender, nondistended, normal bowel sounds Extremities: no cyanosis, clubbing or edema noted bilaterally left forearm ecchymosis improving, no hematoma or drainage. Neuro: Cranial nerves II-XII intact, no focal neurological deficits   The results of significant diagnostics from this hospitalization (including imaging, microbiology, ancillary and laboratory) are listed below for reference.      Procedures/Studies:  Dg Chest 2 View  Result Date: 04/29/2018 CLINICAL DATA:  Two weeks of shortness of breath. History of CHF, COPD, chronic renal insufficiency stage IV, colonic malignancy,  peripheral vascular disease, current smoker. EXAM: CHEST - 2 VIEW COMPARISON:  PA and lateral chest x-ray of March 02, 2017 FINDINGS: The lungs are mildly hyperinflated. The cardiac silhouette is enlarged. The pulmonary vascularity is mildly engorged. The interstitial markings are increased and more conspicuous than on the previous study. There is no pleural effusion. The bony thorax exhibits no acute abnormality. IMPRESSION: COPD.  Superimposed low-grade CHF.  No alveolar pneumonia. Electronically Signed   By: David  Martinique M.D.   On: 04/29/2018 14:08   Dg Chest Port 1 View  Result Date: 04/30/2018 CLINICAL DATA:  Dialysis cath insertion. EXAM: PORTABLE CHEST 1 VIEW COMPARISON:  04/29/2018. FINDINGS: Dialysis catheter noted with its tip over the upper right atrium. Cardiomegaly with normal pulmonary vascularity. Mild left base atelectasis/infiltrate. Small left pleural effusion. No acute bony abnormality. IMPRESSION: Phthisis catheter with its tip over the upper right atrium. No pneumothorax. 2.  Cardiomegaly.  No pulmonary venous congestion. 3. Mild left base atelectasis/infiltrate with small left pleural effusion. Electronically Signed   By: Marcello Moores  Register   On: 04/30/2018 12:54   Dg Chest Portable 1 View  Result Date: 04/29/2018 CLINICAL DATA:  Right-sided hemodialysis catheter placement. EXAM: PORTABLE CHEST 1 VIEW COMPARISON:  04/29/2018 FINDINGS: 1843 hours. Right IJ central catheter tip overlies the mid SVC. No right pneumothorax. No evidence for right pleural effusion. The cardio pericardial silhouette is enlarged. Interstitial markings are diffusely coarsened with chronic features. The visualized bony structures of the thorax are intact. Telemetry leads overlie the chest. IMPRESSION: Right IJ central line tip overlies the mid SVC. No pneumothorax or pleural effusion. Electronically Signed   By: [name] Stanley M.D.   On: 04/29/2018 19:05       LAB RESULTS: Basic Metabolic Panel: Recent Labs   Lab 05/05/18 0339  05/06/18 0424 05/07/18 0426  NA 133*   < > 135 133*  K 3.4*   < > 3.8 3.5  CL 98*   < > 99* 98*  CO2 21*   < > 25 22  GLUCOSE 97   < > 131* 129*  BUN 45*   < > 30* 48*  CREATININE 10.04*   < > 6.99* 8.95*  CALCIUM 9.0   < > 8.5* 8.8*  MG 1.8  --   --   --   PHOS 4.2   < > 3.5 4.7*   < > = values in this interval not displayed.   Liver Function Tests: Recent Labs  Lab 05/06/18 0424 05/07/18 0426  ALBUMIN 2.8* 2.7*   No results for input(s): LIPASE, AMYLASE in the last 168 hours. No results for input(s): AMMONIA in the last 168 hours. CBC: Recent Labs  Lab 05/05/18 1413 05/07/18 2774  WBC 18.1* 18.8*  HGB 10.0* 10.0*  HCT 32.0* 31.7*  MCV 85.6 86.8  PLT 146* 102*   Cardiac Enzymes: No results for input(s): CKTOTAL, CKMB, CKMBINDEX, TROPONINI in the last 168 hours. BNP: Invalid input(s): POCBNP CBG: Recent Labs  Lab 04/30/18 2308 05/02/18 1642  GLUCAP 124* 121*      Disposition and Follow-up: Discharge Instructions    Diet - low sodium heart healthy   Complete by:  As directed    Increase activity slowly   Complete by:  As directed        DISPOSITION: [place], Brandon Christopher, MD Follow up in 5 week(s).   Specialties:  Vascular Surgery, Cardiology Why:  office will call the patient Contact information: [place] [address] [phone]        Truitt Merle, MD Follow up on 06/12/2018.   Specialties:  Hematology, Oncology Why:  at 1:15PM  Contact information: [place] [address] [phone]        [place] dialysis center Follow up.   Why:  [employer] [place] 1st treatment Friday, May 08 2018 at 11am. Schedule and Time: Monday,Wednesday,Friday at 12:45pm           Time coordinating discharge:  38 minutes   Signed:   Estill Cotta M.D. [facility] 05/07/2018, 1:43 PM Pager:  [phone]

## 2018-05-07 NOTE — Care Management Note (Signed)
Case Management Note Marvetta Gibbons RN, BSN Unit 4E-Case Manager [phone]  Patient Details  Name: Whitney Walker MRN: [id number] Date of Birth: [birth year]  Subjective/Objective:  Patient admitted with ARF need for urgent HD- new- temp cath placed- will need perm. Access                  Action/Plan: PTA Pt lived at home with family- referral for Lovenox- pt still on heparin- do not know dose at this time- (pt has both Medicare/Medicaid) - May need to be Clipped prior to discharge- CM to follow for transition of care needs  Expected Discharge Date:  05/07/18               Expected Discharge Plan:  Home/Self Care  In-House Referral:     Discharge planning Services  CM Consult  Post Acute Care Choice:  Durable Medical Equipment Choice offered to:  Patient  DME Arranged:  3-N-1, Shower stool DME Agency:  [place]:  NA [place] Agency:  NA  Status of Service:  Completed, signed off  If discussed at [place] of Stay Meetings, dates discussed:    Discharge Disposition: home/self care   Additional Comments:  05/07/18- 1430- Whitney Leonhard RN, CM- pt for d/c home today- orders have been placed for DME- however pt did not want to wait for DME to be delivered to room per bedside  RN.   05/06/18- 1230- Whitney Lightsey RN, CM- spoke with pt at bedside regarding transition needs- per PT eval no f/u recommendations made- pt however states she does need/want a shower chair- as pt has medicaid it would be covered - would need DME order for shower chair for discharge- Pt also asking about PCS- would like an aide to assist with "things around the house like cooking/cleaning"- explained to pt that this type of service is covered under her Medicaid benefits however there is an approval process that has to go through [place] and she needs to work with her PCP for this- provided pt with the application for [employer] services [employer]) and the info on the qualifying  guidelines and # to call for further questions. Pt to f/u with her PCP regarding PCS needs. Pt has been clipped for outpt HD chair for m/w/f [facility] center.   Whitney Patricia, RN 05/07/2018, 2:35 PM

## 2018-05-07 NOTE — Progress Notes (Signed)
Pt alert and oriented lying in bed, in no apparent distress. We'll continue to monitor.

## 2018-05-08 DIAGNOSIS — N2581 Secondary hyperparathyroidism of renal origin: Secondary | ICD-10-CM | POA: Diagnosis not present

## 2018-05-08 DIAGNOSIS — D509 Iron deficiency anemia, unspecified: Secondary | ICD-10-CM | POA: Diagnosis not present

## 2018-05-08 DIAGNOSIS — N186 End stage renal disease: Secondary | ICD-10-CM | POA: Diagnosis not present

## 2018-05-08 NOTE — Consult Note (Signed)
            [facility] CM Primary Care Navigator  05/08/2018  Whitney Walker January 28, 1956 [id number]   Went to see patient to identify possible discharge needs but staff reports that patient was already discharged home.   Per MD note, patient was sent by her PCP(primary care provider) to the ED on 04/29/2018 due to weakness, decreased appetite, dyspnea, confusion, itching and worsening labs over 2 weeks. She was admitted for new ESRD (end stage renal disease) with uremia and hemodialysis was initiated. Patient will have hemodialysis treatment at [facility] to [facility] dialysis center, M-W-F.   Patient has discharge instruction to follow-up withprimary care provider in 1- 2 weeks. Follow-up with cardiology/ vascular surgery in 5 weeks and hematology on 06/12/18.   For additional questions please contact:  Edwena Felty A. Ajdin Macke, BSN, RN-BC [facility] PRIMARY CARE Navigator Cell: [phone]

## 2018-05-11 ENCOUNTER — Telehealth: Payer: Self-pay | Admitting: Hematology

## 2018-05-11 DIAGNOSIS — N186 End stage renal disease: Secondary | ICD-10-CM | POA: Diagnosis not present

## 2018-05-11 DIAGNOSIS — D509 Iron deficiency anemia, unspecified: Secondary | ICD-10-CM | POA: Diagnosis not present

## 2018-05-11 DIAGNOSIS — N2581 Secondary hyperparathyroidism of renal origin: Secondary | ICD-10-CM | POA: Diagnosis not present

## 2018-05-11 NOTE — Telephone Encounter (Signed)
Patient called and moved lab/fu from 6/14 to 3/54 due to conflict with dialysis schedule on mon-wed-fri. Patient has new date/time.

## 2018-05-13 DIAGNOSIS — N186 End stage renal disease: Secondary | ICD-10-CM | POA: Diagnosis not present

## 2018-05-13 DIAGNOSIS — D509 Iron deficiency anemia, unspecified: Secondary | ICD-10-CM | POA: Diagnosis not present

## 2018-05-13 DIAGNOSIS — N2581 Secondary hyperparathyroidism of renal origin: Secondary | ICD-10-CM | POA: Diagnosis not present

## 2018-05-15 DIAGNOSIS — D509 Iron deficiency anemia, unspecified: Secondary | ICD-10-CM | POA: Diagnosis not present

## 2018-05-15 DIAGNOSIS — N2581 Secondary hyperparathyroidism of renal origin: Secondary | ICD-10-CM | POA: Diagnosis not present

## 2018-05-15 DIAGNOSIS — N186 End stage renal disease: Secondary | ICD-10-CM | POA: Diagnosis not present

## 2018-05-18 DIAGNOSIS — N186 End stage renal disease: Secondary | ICD-10-CM | POA: Diagnosis not present

## 2018-05-18 DIAGNOSIS — N2581 Secondary hyperparathyroidism of renal origin: Secondary | ICD-10-CM | POA: Diagnosis not present

## 2018-05-18 DIAGNOSIS — D509 Iron deficiency anemia, unspecified: Secondary | ICD-10-CM | POA: Diagnosis not present

## 2018-05-19 ENCOUNTER — Ambulatory Visit (INDEPENDENT_AMBULATORY_CARE_PROVIDER_SITE_OTHER): Payer: Medicare Other | Admitting: Psychiatry

## 2018-05-19 DIAGNOSIS — F333 Major depressive disorder, recurrent, severe with psychotic symptoms: Secondary | ICD-10-CM

## 2018-05-20 DIAGNOSIS — N186 End stage renal disease: Secondary | ICD-10-CM | POA: Diagnosis not present

## 2018-05-20 DIAGNOSIS — D509 Iron deficiency anemia, unspecified: Secondary | ICD-10-CM | POA: Diagnosis not present

## 2018-05-20 DIAGNOSIS — N2581 Secondary hyperparathyroidism of renal origin: Secondary | ICD-10-CM | POA: Diagnosis not present

## 2018-05-22 DIAGNOSIS — D509 Iron deficiency anemia, unspecified: Secondary | ICD-10-CM | POA: Diagnosis not present

## 2018-05-22 DIAGNOSIS — N2581 Secondary hyperparathyroidism of renal origin: Secondary | ICD-10-CM | POA: Diagnosis not present

## 2018-05-22 DIAGNOSIS — N186 End stage renal disease: Secondary | ICD-10-CM | POA: Diagnosis not present

## 2018-05-25 DIAGNOSIS — D509 Iron deficiency anemia, unspecified: Secondary | ICD-10-CM | POA: Diagnosis not present

## 2018-05-25 DIAGNOSIS — N2581 Secondary hyperparathyroidism of renal origin: Secondary | ICD-10-CM | POA: Diagnosis not present

## 2018-05-25 DIAGNOSIS — N186 End stage renal disease: Secondary | ICD-10-CM | POA: Diagnosis not present

## 2018-05-27 DIAGNOSIS — N2581 Secondary hyperparathyroidism of renal origin: Secondary | ICD-10-CM | POA: Diagnosis not present

## 2018-05-27 DIAGNOSIS — D509 Iron deficiency anemia, unspecified: Secondary | ICD-10-CM | POA: Diagnosis not present

## 2018-05-27 DIAGNOSIS — N186 End stage renal disease: Secondary | ICD-10-CM | POA: Diagnosis not present

## 2018-05-29 DIAGNOSIS — N186 End stage renal disease: Secondary | ICD-10-CM | POA: Diagnosis not present

## 2018-05-29 DIAGNOSIS — N2581 Secondary hyperparathyroidism of renal origin: Secondary | ICD-10-CM | POA: Diagnosis not present

## 2018-05-29 DIAGNOSIS — D509 Iron deficiency anemia, unspecified: Secondary | ICD-10-CM | POA: Diagnosis not present

## 2018-05-30 DIAGNOSIS — I129 Hypertensive chronic kidney disease with stage 1 through stage 4 chronic kidney disease, or unspecified chronic kidney disease: Secondary | ICD-10-CM | POA: Diagnosis not present

## 2018-05-30 DIAGNOSIS — Z992 Dependence on renal dialysis: Secondary | ICD-10-CM | POA: Diagnosis not present

## 2018-05-30 DIAGNOSIS — N186 End stage renal disease: Secondary | ICD-10-CM | POA: Diagnosis not present

## 2018-06-01 DIAGNOSIS — D631 Anemia in chronic kidney disease: Secondary | ICD-10-CM | POA: Diagnosis not present

## 2018-06-01 DIAGNOSIS — D509 Iron deficiency anemia, unspecified: Secondary | ICD-10-CM | POA: Diagnosis not present

## 2018-06-01 DIAGNOSIS — N2581 Secondary hyperparathyroidism of renal origin: Secondary | ICD-10-CM | POA: Diagnosis not present

## 2018-06-01 DIAGNOSIS — N186 End stage renal disease: Secondary | ICD-10-CM | POA: Diagnosis not present

## 2018-06-03 DIAGNOSIS — D509 Iron deficiency anemia, unspecified: Secondary | ICD-10-CM | POA: Diagnosis not present

## 2018-06-03 DIAGNOSIS — N2581 Secondary hyperparathyroidism of renal origin: Secondary | ICD-10-CM | POA: Diagnosis not present

## 2018-06-03 DIAGNOSIS — N186 End stage renal disease: Secondary | ICD-10-CM | POA: Diagnosis not present

## 2018-06-03 DIAGNOSIS — D631 Anemia in chronic kidney disease: Secondary | ICD-10-CM | POA: Diagnosis not present

## 2018-06-04 DIAGNOSIS — N186 End stage renal disease: Secondary | ICD-10-CM | POA: Diagnosis not present

## 2018-06-04 DIAGNOSIS — Z992 Dependence on renal dialysis: Secondary | ICD-10-CM | POA: Diagnosis not present

## 2018-06-04 DIAGNOSIS — H1131 Conjunctival hemorrhage, right eye: Secondary | ICD-10-CM | POA: Diagnosis not present

## 2018-06-04 DIAGNOSIS — H538 Other visual disturbances: Secondary | ICD-10-CM | POA: Diagnosis not present

## 2018-06-04 DIAGNOSIS — Z86711 Personal history of pulmonary embolism: Secondary | ICD-10-CM | POA: Diagnosis not present

## 2018-06-04 DIAGNOSIS — Z0000 Encounter for general adult medical examination without abnormal findings: Secondary | ICD-10-CM | POA: Diagnosis not present

## 2018-06-04 DIAGNOSIS — I129 Hypertensive chronic kidney disease with stage 1 through stage 4 chronic kidney disease, or unspecified chronic kidney disease: Secondary | ICD-10-CM | POA: Diagnosis not present

## 2018-06-04 DIAGNOSIS — Z716 Tobacco abuse counseling: Secondary | ICD-10-CM | POA: Diagnosis not present

## 2018-06-04 DIAGNOSIS — F329 Major depressive disorder, single episode, unspecified: Secondary | ICD-10-CM | POA: Diagnosis not present

## 2018-06-04 DIAGNOSIS — R7309 Other abnormal glucose: Secondary | ICD-10-CM | POA: Diagnosis not present

## 2018-06-04 DIAGNOSIS — J449 Chronic obstructive pulmonary disease, unspecified: Secondary | ICD-10-CM | POA: Diagnosis not present

## 2018-06-04 DIAGNOSIS — I120 Hypertensive chronic kidney disease with stage 5 chronic kidney disease or end stage renal disease: Secondary | ICD-10-CM | POA: Diagnosis not present

## 2018-06-04 DIAGNOSIS — E782 Mixed hyperlipidemia: Secondary | ICD-10-CM | POA: Diagnosis not present

## 2018-06-04 LAB — CBC AND DIFFERENTIAL
HCT: 33 — AB (ref 36–46)
Hemoglobin: 10.4 — AB (ref 12.0–16.0)
Neutrophils Absolute: 9
Platelets: 205 (ref 150–399)
WBC: 13.3

## 2018-06-04 LAB — LIPID PANEL
Cholesterol: 191 (ref 0–200)
HDL: 63 (ref 35–70)
LDL Cholesterol: 97
LDl/HDL Ratio: 1.5
Triglycerides: 155 (ref 40–160)

## 2018-06-04 LAB — BASIC METABOLIC PANEL
BUN: 26 — AB (ref 4–21)
Creatinine: 5.7 — AB (ref 0.5–1.1)
Glucose: 121
Potassium: 4.3 (ref 3.4–5.3)
Sodium: 138 (ref 137–147)

## 2018-06-04 LAB — HEPATIC FUNCTION PANEL
ALT: 17 (ref 7–35)
AST: 20 (ref 13–35)
Alkaline Phosphatase: 117 (ref 25–125)
Bilirubin, Total: 0.2

## 2018-06-04 LAB — TSH: TSH: 1.57 (ref 0.41–5.90)

## 2018-06-04 LAB — HEMOGLOBIN A1C: Hgb A1c MFr Bld: 5.1 (ref 4.0–6.0)

## 2018-06-05 DIAGNOSIS — N2581 Secondary hyperparathyroidism of renal origin: Secondary | ICD-10-CM | POA: Diagnosis not present

## 2018-06-05 DIAGNOSIS — D631 Anemia in chronic kidney disease: Secondary | ICD-10-CM | POA: Diagnosis not present

## 2018-06-05 DIAGNOSIS — D509 Iron deficiency anemia, unspecified: Secondary | ICD-10-CM | POA: Diagnosis not present

## 2018-06-05 DIAGNOSIS — N186 End stage renal disease: Secondary | ICD-10-CM | POA: Diagnosis not present

## 2018-06-08 DIAGNOSIS — D509 Iron deficiency anemia, unspecified: Secondary | ICD-10-CM | POA: Diagnosis not present

## 2018-06-08 DIAGNOSIS — D631 Anemia in chronic kidney disease: Secondary | ICD-10-CM | POA: Diagnosis not present

## 2018-06-08 DIAGNOSIS — N2581 Secondary hyperparathyroidism of renal origin: Secondary | ICD-10-CM | POA: Diagnosis not present

## 2018-06-08 DIAGNOSIS — N186 End stage renal disease: Secondary | ICD-10-CM | POA: Diagnosis not present

## 2018-06-09 ENCOUNTER — Ambulatory Visit: Payer: Medicare Other | Admitting: Psychiatry

## 2018-06-10 DIAGNOSIS — D631 Anemia in chronic kidney disease: Secondary | ICD-10-CM | POA: Diagnosis not present

## 2018-06-10 DIAGNOSIS — N2581 Secondary hyperparathyroidism of renal origin: Secondary | ICD-10-CM | POA: Diagnosis not present

## 2018-06-10 DIAGNOSIS — N186 End stage renal disease: Secondary | ICD-10-CM | POA: Diagnosis not present

## 2018-06-10 DIAGNOSIS — D509 Iron deficiency anemia, unspecified: Secondary | ICD-10-CM | POA: Diagnosis not present

## 2018-06-11 ENCOUNTER — Telehealth: Payer: Self-pay | Admitting: Hematology

## 2018-06-11 ENCOUNTER — Inpatient Hospital Stay: Payer: Medicare Other | Attending: Hematology

## 2018-06-11 ENCOUNTER — Telehealth: Payer: Self-pay

## 2018-06-11 ENCOUNTER — Encounter: Payer: Self-pay | Admitting: Hematology

## 2018-06-11 ENCOUNTER — Inpatient Hospital Stay (HOSPITAL_BASED_OUTPATIENT_CLINIC_OR_DEPARTMENT_OTHER): Payer: Medicare Other | Admitting: Hematology

## 2018-06-11 VITALS — BP 114/79 | HR 93 | Temp 98.2°F | Resp 18 | Ht 62.99 in | Wt 191.4 lb

## 2018-06-11 DIAGNOSIS — N186 End stage renal disease: Secondary | ICD-10-CM

## 2018-06-11 DIAGNOSIS — G4733 Obstructive sleep apnea (adult) (pediatric): Secondary | ICD-10-CM

## 2018-06-11 DIAGNOSIS — Z85038 Personal history of other malignant neoplasm of large intestine: Secondary | ICD-10-CM | POA: Insufficient documentation

## 2018-06-11 DIAGNOSIS — I2699 Other pulmonary embolism without acute cor pulmonale: Secondary | ICD-10-CM

## 2018-06-11 DIAGNOSIS — N184 Chronic kidney disease, stage 4 (severe): Secondary | ICD-10-CM

## 2018-06-11 DIAGNOSIS — F418 Other specified anxiety disorders: Secondary | ICD-10-CM | POA: Insufficient documentation

## 2018-06-11 DIAGNOSIS — I509 Heart failure, unspecified: Secondary | ICD-10-CM

## 2018-06-11 DIAGNOSIS — I82412 Acute embolism and thrombosis of left femoral vein: Secondary | ICD-10-CM

## 2018-06-11 DIAGNOSIS — I120 Hypertensive chronic kidney disease with stage 5 chronic kidney disease or end stage renal disease: Secondary | ICD-10-CM | POA: Insufficient documentation

## 2018-06-11 DIAGNOSIS — I739 Peripheral vascular disease, unspecified: Secondary | ICD-10-CM

## 2018-06-11 DIAGNOSIS — G2581 Restless legs syndrome: Secondary | ICD-10-CM | POA: Diagnosis not present

## 2018-06-11 DIAGNOSIS — Z86711 Personal history of pulmonary embolism: Secondary | ICD-10-CM | POA: Diagnosis not present

## 2018-06-11 DIAGNOSIS — Z992 Dependence on renal dialysis: Secondary | ICD-10-CM

## 2018-06-11 DIAGNOSIS — F17210 Nicotine dependence, cigarettes, uncomplicated: Secondary | ICD-10-CM | POA: Insufficient documentation

## 2018-06-11 DIAGNOSIS — Z7901 Long term (current) use of anticoagulants: Secondary | ICD-10-CM

## 2018-06-11 DIAGNOSIS — D631 Anemia in chronic kidney disease: Secondary | ICD-10-CM

## 2018-06-11 DIAGNOSIS — D72829 Elevated white blood cell count, unspecified: Secondary | ICD-10-CM | POA: Insufficient documentation

## 2018-06-11 DIAGNOSIS — Z86718 Personal history of other venous thrombosis and embolism: Secondary | ICD-10-CM | POA: Insufficient documentation

## 2018-06-11 DIAGNOSIS — D509 Iron deficiency anemia, unspecified: Secondary | ICD-10-CM

## 2018-06-11 DIAGNOSIS — Z1231 Encounter for screening mammogram for malignant neoplasm of breast: Secondary | ICD-10-CM

## 2018-06-11 DIAGNOSIS — N183 Chronic kidney disease, stage 3 (moderate): Secondary | ICD-10-CM

## 2018-06-11 LAB — COMPREHENSIVE METABOLIC PANEL
ALT: 14 U/L (ref 0–55)
AST: 19 U/L (ref 5–34)
Albumin: 3 g/dL — ABNORMAL LOW (ref 3.5–5.0)
Alkaline Phosphatase: 125 U/L (ref 40–150)
Anion gap: 8 (ref 3–11)
BUN: 33 mg/dL — ABNORMAL HIGH (ref 7–26)
CO2: 25 mmol/L (ref 22–29)
Calcium: 9.9 mg/dL (ref 8.4–10.4)
Chloride: 101 mmol/L (ref 98–109)
Creatinine, Ser: 6.05 mg/dL (ref 0.60–1.10)
GFR calc Af Amer: 8 mL/min — ABNORMAL LOW (ref >60)
GFR calc non Af Amer: 7 mL/min — ABNORMAL LOW (ref >60)
Glucose, Bld: 101 mg/dL (ref 70–140)
Potassium: 5 mmol/L (ref 3.5–5.1)
Sodium: 134 mmol/L — ABNORMAL LOW (ref 136–145)
Total Bilirubin: 0.3 mg/dL (ref 0.2–1.2)
Total Protein: 7.3 g/dL (ref 6.4–8.3)

## 2018-06-11 LAB — CBC WITH DIFFERENTIAL/PLATELET
Basophils Absolute: 0 10*3/uL (ref 0.0–0.1)
Basophils Relative: 0 %
Eosinophils Absolute: 0.2 10*3/uL (ref 0.0–0.5)
Eosinophils Relative: 2 %
HCT: 33.9 % — ABNORMAL LOW (ref 34.8–46.6)
Hemoglobin: 10.4 g/dL — ABNORMAL LOW (ref 11.6–15.9)
Lymphocytes Relative: 23 %
Lymphs Abs: 3.3 10*3/uL (ref 0.9–3.3)
MCH: 28.6 pg (ref 25.1–34.0)
MCHC: 30.7 g/dL — ABNORMAL LOW (ref 31.5–36.0)
MCV: 93.1 fL (ref 79.5–101.0)
Monocytes Absolute: 0.7 10*3/uL (ref 0.1–0.9)
Monocytes Relative: 5 %
Neutro Abs: 10.3 10*3/uL — ABNORMAL HIGH (ref 1.5–6.5)
Neutrophils Relative %: 70 %
Platelets: 161 10*3/uL (ref 145–400)
RBC: 3.64 MIL/uL — ABNORMAL LOW (ref 3.70–5.45)
RDW: 17 % — ABNORMAL HIGH (ref 11.2–14.5)
WBC: 14.4 10*3/uL — ABNORMAL HIGH (ref 3.9–10.3)

## 2018-06-11 NOTE — Progress Notes (Signed)
[place]  [phone] [phone]  Clinic follow up Note   Patient Care Team: Glendale Chard, MD as PCP - General (Internal Medicine) Waynetta Sandy, MD as Consulting Physician (Vascular Surgery) Edrick Oh, MD as Consulting Physician (Nephrology)   Date of Service:  06/11/2018  CHIEF COMPLAINTS:  Follow up anemia, history of thrombosis   HISTORY OF PRESENTING ILLNESS (08/14/2015):  Whitney Walker 62 y.o. female is referred here by her PCP Dr. Baird Cancer for mild leukocytosis.   She was previously seen by Dr. Jamse Arn in 2013 for leukocytosis. Her WBC has been around 11-18K since 08/2009, with predominantly neutrophils. BCR/ABL and check to mutation were checked in 2013 which were all negative. She also had a mild anemia in the past with hemoglobin in the 11-12 range, her platelet count has been normal.  She had PE in 10/2013 and was treated with Xaralto until 11/2014, she is now on aspirin. She has PVD and had left femoral popliteal vein bypass surgery in 07/2013. She also has COPD, she did use oxygen after she had PE, but not any more since Nov 2015 due to financial reasons. She has mild to moderate dyspenia on exertion.   She had tried quitting smoking a few times and finally stopped about 5 weeks ago.   She has had sinus infection on monthly bases, and recently caught a cold from her grand-grandson. She has headache for the past 4-5 month. She is being referred to see a neurologist.   She had early stage colon cancer in 2009, had surgical resection and no adjuvant chemo was needed. She had last colonoscopy in 05/2012  which was unremarkable. Her last mammogram was 2 years ago and she is planning to have a repeated one soon.   CURRENT THERAPY: Eliquis 2.5mg  bid (dose per renal function), held since 04/17/18 and restarted after surgery in 04/2018.   INTERIM HISTORY  Whitney Walker returns for follow up. Since her last visit she had surgery on  her AV fistula on 05/05/18.  She presents to the clinic today by herself. She notes she has been on Dialysis for 1 months now. She notes since starting she has had low BP. She goes to the [facility]. She notes she was near syncope during dialysis. She did not expect that her BP would bottom out from dialysis.  She notes still having some urination. She does not like the organization of her current dialysis center. She notes she does not see Dr. [name] any more since being on dialysis. She is on her RLS medication, Mirapex, but was told by Dialysis NP to only take this at night. She restarted her Eliquis at current dose. She notes she goes to Dr. Baird Cancer or NP for her annual check up.    On review of symptoms, pt denies any bleeding. She notes occasional near syncope from dialysis and low BP.    MEDICAL HISTORY:  Past Medical History:  Diagnosis Date  . Anemia   . Anxiety   . Arthritis   . CHF (congestive heart failure) ([place])   . CKD (chronic kidney disease)    Stage III 03/2013  . Colon cancer ([place])   . COPD (chronic obstructive pulmonary disease) ([place])   . Coronary artery disease   . Depression   . Hypertension   . Meningitis   . PE (pulmonary embolism)   . Peripheral vascular disease ([place])   . Shortness of breath   . Sleep apnea   .  Vertigo     SURGICAL HISTORY: Past Surgical History:  Procedure Laterality Date  . ABDOMINAL AORTAGRAM N/A 07/26/2013   Procedure: ABDOMINAL Maxcine Ham;  Surgeon: [name] [place], MD;  Location: [facility] CATH LAB;  Service: Cardiovascular;  Laterality: N/A;  . ABDOMINAL HYSTERECTOMY    . AV FISTULA PLACEMENT Left 05/05/2018   Procedure: ARTERIOVENOUS (AV) FISTULA CREATION BRACHIOCEPHALIC;  Surgeon: Waynetta Sandy, MD;  Location: [place];  Service: Vascular;  Laterality: Left;  . CESAREAN SECTION     X 3 [id number]  . CHOLECYSTECTOMY    . CHOLECYSTECTOMY  1980/s  . COLECTOMY  2010  . DIVERTICULOSIS SURGERY-2002  2012  .  FEMORAL-POPLITEAL BYPASS GRAFT Left 08/13/2013   Procedure: BYPASS GRAFT FEMORAL-POPLITEAL ARTERY WITH NON-REVERSED SAPHANEOUS VEIN; ULTRASOUND GUIDED;  Surgeon: Mal [name], MD;  Location: [place];  Service: Vascular;  Laterality: Left;  . INSERTION OF DIALYSIS CATHETER Right 04/30/2018   Procedure: INSERTION OF Right Internal Jugular DIALYSIS CATHETER;  Surgeon: [name], MD;  Location: [place];  Service: Vascular;  Laterality: Right;  . LOWER EXTREMITY ANGIOGRAM Left 07/26/2013   Procedure: LOWER EXTREMITY ANGIOGRAM;  Surgeon: [name] [place], MD;  Location: [facility] CATH LAB;  Service: Cardiovascular;  Laterality: Left;    SOCIAL HISTORY: Social History   Socioeconomic History  . Marital status: Single    Spouse name: Not on file  . Number of children: 4  . Years of education: 12th gd  . Highest education level: Not on file  Occupational History  . Occupation: N/A  Social Needs  . Financial resource strain: Not on file  . Food insecurity:    Worry: Not on file    Inability: Not on file  . Transportation needs:    Medical: Not on file    Non-medical: Not on file  Tobacco Use  . Smoking status: Current Every Day Smoker    Packs/day: 1.00    Years: 40.00    Pack years: 40.00    Types: Cigarettes  . Smokeless tobacco: Current User  Substance and Sexual Activity  . Alcohol use: No    Alcohol/week: 0.0 oz    Comment: she used to drink alcohol, quit in 2010   . Drug use: No  . Sexual activity: Not on file  Lifestyle  . Physical activity:    Days per week: Not on file    Minutes per session: Not on file  . Stress: Not on file  Relationships  . Social connections:    Talks on phone: Not on file    Gets together: Not on file    Attends religious service: Not on file    Active member of club or organization: Not on file    Attends meetings of clubs or organizations: Not on file    Relationship status: Not on file  . Intimate partner violence:    Fear of current or ex partner:  Not on file    Emotionally abused: Not on file    Physically abused: Not on file    Forced sexual activity: Not on file  Other Topics Concern  . Not on file  Social History Narrative   2 cups of coffee a day     FAMILY HISTORY: Family History  Problem Relation Age of Onset  . Cancer Mother 33       breast and bone  . Cancer Father 58       prostate  . Hypertension Sister   . Bleeding Disorder Sister   .  Cancer Cousin 20       breast cancer   . Hypertension Daughter     ALLERGIES:  is allergic to gadolinium derivatives and iohexol.  MEDICATIONS:  Current Outpatient Medications  Medication Sig Dispense Refill  . albuterol (PROVENTIL HFA;VENTOLIN HFA) 108 (90 Base) MCG/ACT inhaler Inhale 2 puffs into the lungs every 6 (six) hours as needed for wheezing or shortness of breath.     [name] apixaban (ELIQUIS) 2.5 MG TABS tablet Take 1 tablet (2.5 mg total) by mouth 2 (two) times daily. 60 tablet 5  . calcitRIOL (ROCALTROL) 0.5 MCG capsule Take 1 capsule (0.5 mcg total) by mouth every other day. 30 capsule 2  . multivitamin (RENA-VIT) TABS tablet Take 1 tablet by mouth at bedtime. 30 tablet 3  . mupirocin ointment (BACTROBAN) 2 % Apply 1 application topically daily.    . NYSTATIN powder Apply to affected area twice daily as needed for infection  2  . pramipexole (MIRAPEX) 0.125 MG tablet Take 1 tablet (0.125 mg total) by mouth 3 (three) times daily. 90 tablet 1  . promethazine (PHENERGAN) 12.5 MG tablet Take 1 tablet (12.5 mg total) by mouth every 6 (six) hours as needed for nausea or vomiting. 30 tablet 0  . Ascorbic Acid (VITAMIN C) 1000 MG tablet Take 1,000 mg by mouth every other day.    . ciprofloxacin-dexamethasone (CIPRODEX) OTIC suspension Place 4 drops into the right ear 2 (two) times daily. X 3 days (Patient not taking: Reported on 06/11/2018) 7.5 mL 0  . diphenhydrAMINE (BENADRYL) 25 mg capsule Take 25 mg by mouth daily as needed for allergies.     No current  facility-administered medications for this visit.     REVIEW OF SYSTEMS: Constitutional: Denies fevers or abnormal night sweats  Eyes: Denies blurriness of vision, double vision or watery eyes Ears, nose, mouth, throat, and face: Denies mucositis or sore throat Respiratory: Denies cough, dyspnea or wheezes Cardiovascular: Denies palpitation, chest discomfort or lower extremity swelling (+) occasional syncope from hypotension and dialysis.  Gastrointestinal:  Denies nausea, heartburn or change in bowel habits Skin: negative  Lymphatics: Denies new lymphadenopathy or easy bruising Neurological:Denies numbness, tingling or new weaknesses Behavioral/Psych: (+) Depression and Anxiety  All other systems were reviewed with the patient and are negative.  PHYSICAL EXAMINATION:  ECOG PERFORMANCE STATUS: 2  Vitals:   06/11/18 1306  BP: 114/79  Pulse: 93  Resp: 18  Temp: 98.2 F (36.8 C)  SpO2: 100%   Filed Weights   06/11/18 1306  Weight: 191 lb 6.4 oz (86.8 kg)    GENERAL:alert, no distress and comfortable aSKIN: skin color, texture, turgor are normal (+) diffuse skin hyperpigmentation from previous rash, across the front chest. No skin erythema, ulcers, or discharge  EYES: normal, conjunctiva are pink and non-injected, sclera clear OROPHARYNX:no exudate, no erythema and lips, buccal mucosa, and tongue normal  NECK: supple, thyroid normal size, non-tender, without nodularity LYMPH:  no palpable lymphadenopathy in the cervical, axillary or inguinal LUNGS: clear to auscultation and percussion (+) labored breathing HEART: regular rate & rhythm and no murmurs. ABDOMEN:abdomen soft, non-tender and normal bowel sounds Musculoskeletal:no cyanosis of digits and no clubbing  PSYCH: alert & oriented x 3 with fluent speech NEURO: no focal motor/sensory deficits  LABORATORY DATA:  I have reviewed the data as listed CBC Latest Ref Rng & Units 06/11/2018 05/07/2018 05/05/2018  WBC 3.9 - 10.3 K/uL  14.4(H) 18.8(H) 18.1(H)  Hemoglobin 11.6 - 15.9 g/dL 10.4(L) 10.0(L) 10.0(L)  Hematocrit 34.8 -  46.6 % 33.9(L) 31.7(L) 32.0(L)  Platelets 145 - 400 K/uL 161 102(L) 146(L)    CMP Latest Ref Rng & Units 06/11/2018 05/07/2018 05/06/2018  Glucose 70 - 140 mg/dL 101 129(H) 131(H)  BUN 7 - 26 mg/dL 33(H) 48(H) 30(H)  Creatinine 0.60 - 1.10 mg/dL 6.05(HH) 8.95(H) 6.99(H)  Sodium 136 - 145 mmol/L 134(L) 133(L) 135  Potassium 3.5 - 5.1 mmol/L 5.0 3.5 3.8  Chloride 98 - 109 mmol/L 101 98(L) 99(L)  CO2 22 - 29 mmol/L 25 22 25   Calcium 8.4 - 10.4 mg/dL 9.9 8.8(L) 8.5(L)  Total Protein 6.4 - 8.3 g/dL 7.3 - -  Total Bilirubin 0.2 - 1.2 mg/dL 0.3 - -  Alkaline Phos 40 - 150 U/L 125 - -  AST 5 - 34 U/L 19 - -  ALT 0 - 55 U/L 14 - -   PATHOLOGY REPORT Diagnosis 02/06/2016 Breast, left, needle core biopsy, 11:00 o'clock - FAT NECROSIS WITH ASSOCIATED CALCIFICATION. - NO ATYPIA OR TUMOR SEEN.  RADIOGRAPHIC STUDIES: I have personally reviewed the radiological images as listed and agreed with the findings in the report. No results found.  ASSESSMENT & PLAN:  62 y.o. female with history of DVT, chronic mild leukocytosis and anemia.  1. Recurrent DVT and PE -She previously had episodes of PE, was treated with Xarelto. 10 months after she came off Xarelto, she developed unprovoked DVT and PE. -I previously recommended her to continue anticoagulation indefinitely, if no contraindications. -Patient was previously on Coumadin, but switched to Eliquis due to her transportation issues with Coumadin check up. -When she had stage IV CKD, We previously discussed that coumadin is a better option for patients with severe renal dysfunction. However she has difficulty with her transportation for coumadin checkup, and wishes to continue Eliquis. She takes Eliquis 2.5mg  BID. -Due to her ESRD, I discussed her switching to Coumadin again. She again declined due to transportation issue. -She has a rash over her chest and  she believes it could be caused by her Eliquis. I previously recommended her to stop Eliquis until after she starts dialysis.  -She underwent surgery for her fistula on 05/05/18 and started dialysis around the same time.  -She has been moderately tolerating dialysis with hypotension and occasional syncope during infusion. I recommend she share her concerns with her physician at the [facility]. I will contact them as well.  -She restarted Eliquis at 2.5mg . She is tolerating well with no sign of bleeding, will continue.  -Labs reviewed, has mild anemia, leukocytosis and elevated ANC. She has low albumin and previously had low total protein. I suggest she increase her protein intake.  -I discussed her DVT/PE are under control with treatment. I will continue to monitor her blood counts. I discussed she should continue to focus on her BP and her dialysis. I will help to communicate with her dialysis team.  -I reviewed cancer screening. She had a Colonoscopy by Dr. Collene Mares in the past 5 years, but is overdue for a mammogram. I ordered her mammogram to be done in 07/2018.  -F/u in 4 months.    2. Mild leukocytosis, with predominant neutrophils -Her total white count previously was initially 13.5K, with neutrophil 10.3K. Her mild leukocytosis has been stable over the past 3 years, previous workup was negative for BCR/ABL or JAK 2 mutation. This is likely reactive, secondary to her smoking and chronic inflammations (allergy and sinus Infection). No evidence of myeloproliferative neoplasm.  -WBC at 14.4 and ANC at 10.3  today (06/11/18) -Follow-up CBC with diff   3. Anemia of chronic disease and anemia of iron deficiency -likely related to her stage III chronic kidney disease  -Her iron study and SPEP were normal in 2013 -Iron 41 %SAT 16. HGB 10.9 I previously encouraged her to continue oral iron supplement, she is concerned about the cost. She was on oral iron but stopped in early 2019.    -I suspect worsened anemia is due to her worsening kidney function. I previously discussed that this may become slightly worse since starting dialysis in 04/2018. -She previously agreed to start taking oral iron pill again.  -Hg at 10.4 today (06/11/18), stable.   4. History of colon cancer in 2009  -She has completed 5 years of surveillance.  -colonoscopy in 05/2012 -last coloscopy done by Dr. Collene Mares within the last 5 years according to patient.   5. CKD, stage V, on HD -Continue follow up with primary care physician -I recommenced her to try the renal diet to help with a healthy diet safe for her kidneys. -I previously discussed the worsening trend in her Cr. We do not have safe data about Eliquis in stage V chronic kidney disease, and I recommend her to switch to Coumadin. However she has transportation issues and reluctance to start Coumadin.   -She has restarted Eliquis at 2.5 mg twice daily after she started hemodialysis -She underwent fistula surgery with Dr. Donzetta Matters on 05/05/18 and she also started dialysis in 04/2018.  -Pt is not satisfied with the current [facility], to her hypertension during dialysis, and poor communication with her providers.  I sent a message to her dialysis center and suggested her nephrologist to see her    6. OSA, PVD, CHF -She will continue follow-up with her primary care physician and other specialists.  7. Restless leg syndrome -f/u with PCP  -Her Mirapex dose was decreased to once at night by her Dialysis NP.   8. Anxiety and depression   -Pt has increasing anxiety about dialysis. She has started to see a Psychiatrist lately  -I previously offered social work services, pt declined. -She is being followed by a Dr. Ouida Sills for counseling.   Plan -Labs reviewed and adequate to continue Eliquis 2.5 mg daily  -Lab and f/u in 4 months  -Mammogram at [facility] in 07/2018  -Patient is very concerned about her hypertension during dialysis, and  feels the communication with her nephrologist was very poor.  I sent a message to her dialysis center.   All questions were answered. The patient knows to call the clinic with any problems, questions or concerns.  I spent 20 minutes counseling the patient face to face. The total time spent in the appointment was 25 minutes and more than 50% was on counseling.  Oneal Deputy, am acting as scribe for Truitt Merle, MD.   I have reviewed the above documentation for accuracy and completeness, and I agree with the above.     Truitt Merle, MD 06/11/2018 4:21 PM

## 2018-06-11 NOTE — Telephone Encounter (Signed)
Appointments scheduled calendar/ letter mailed to patient per her request per 6/13 los

## 2018-06-11 NOTE — Telephone Encounter (Signed)
Called [facility] Phone [phone] spoke with Lincoln Brigham regarding this patient. Explained she is overwhelmed and feels like she has not received adequate information.  She is scheduled for dialysis in the morning and they will have the PA and SW meet with the patient to answer any questions she has.

## 2018-06-11 NOTE — Addendum Note (Signed)
Addended by: Truitt Merle on: 06/11/2018 04:56 PM   Modules accepted: Orders

## 2018-06-12 ENCOUNTER — Ambulatory Visit: Payer: Self-pay | Admitting: Hematology

## 2018-06-12 ENCOUNTER — Other Ambulatory Visit: Payer: Self-pay

## 2018-06-12 DIAGNOSIS — N186 End stage renal disease: Secondary | ICD-10-CM | POA: Diagnosis not present

## 2018-06-12 DIAGNOSIS — N2581 Secondary hyperparathyroidism of renal origin: Secondary | ICD-10-CM | POA: Diagnosis not present

## 2018-06-12 DIAGNOSIS — D509 Iron deficiency anemia, unspecified: Secondary | ICD-10-CM | POA: Diagnosis not present

## 2018-06-12 DIAGNOSIS — D631 Anemia in chronic kidney disease: Secondary | ICD-10-CM | POA: Diagnosis not present

## 2018-06-15 ENCOUNTER — Encounter (HOSPITAL_COMMUNITY): Payer: Self-pay

## 2018-06-15 DIAGNOSIS — N2581 Secondary hyperparathyroidism of renal origin: Secondary | ICD-10-CM | POA: Diagnosis not present

## 2018-06-15 DIAGNOSIS — D631 Anemia in chronic kidney disease: Secondary | ICD-10-CM | POA: Diagnosis not present

## 2018-06-15 DIAGNOSIS — N186 End stage renal disease: Secondary | ICD-10-CM | POA: Diagnosis not present

## 2018-06-15 DIAGNOSIS — D509 Iron deficiency anemia, unspecified: Secondary | ICD-10-CM | POA: Diagnosis not present

## 2018-06-16 ENCOUNTER — Ambulatory Visit (HOSPITAL_COMMUNITY)
Admission: RE | Admit: 2018-06-16 | Discharge: 2018-06-16 | Disposition: A | Discharge status: Home / Self Care | Payer: Medicare Other | Source: Ambulatory Visit | Attending: Vascular Surgery | Admitting: Vascular Surgery

## 2018-06-16 ENCOUNTER — Encounter (HOSPITAL_COMMUNITY): Payer: Self-pay

## 2018-06-16 ENCOUNTER — Encounter: Payer: Self-pay | Admitting: *Deleted

## 2018-06-16 ENCOUNTER — Ambulatory Visit (INDEPENDENT_AMBULATORY_CARE_PROVIDER_SITE_OTHER): Payer: Self-pay | Admitting: Physician Assistant

## 2018-06-16 ENCOUNTER — Other Ambulatory Visit: Payer: Self-pay | Admitting: *Deleted

## 2018-06-16 DIAGNOSIS — N184 Chronic kidney disease, stage 4 (severe): Secondary | ICD-10-CM | POA: Diagnosis not present

## 2018-06-16 DIAGNOSIS — Z48812 Encounter for surgical aftercare following surgery on the circulatory system: Secondary | ICD-10-CM | POA: Diagnosis not present

## 2018-06-16 DIAGNOSIS — N186 End stage renal disease: Secondary | ICD-10-CM

## 2018-06-16 DIAGNOSIS — Z4931 Encounter for adequacy testing for hemodialysis: Secondary | ICD-10-CM | POA: Insufficient documentation

## 2018-06-16 DIAGNOSIS — Z992 Dependence on renal dialysis: Secondary | ICD-10-CM

## 2018-06-16 NOTE — Progress Notes (Signed)
POST OPERATIVE OFFICE NOTE    CC:  F/u for surgery  HPI:  This is a 62 y.o. female who is s/p left brachial cephalic AV fistula by Dr. Donzetta Matters on 05/05/18.  She had removal of her temporary dialysis catheter and had tunneled dialysis catheter placed by Dr. Trula Slade on 04/30/18.  She returns today for her duplex and f/u with PA.  She denies any pain in her hand or numbness.    She is on Eliquis.   Allergies  Allergen Reactions  . Gadolinium Derivatives Hives and Other (See Comments)    HIVES, Desc: HIVES W/ "DYE" USED FOR 1ST CT SCAN BUT NOT 2ND, NO PREMEDS USED, PT UNCERTAIN OF CIRCUMSTANCES,,?POSSIBLE MRI CONTRAST ALLERGY, ALL STUDIES DONE "SOMEWHERE" IN [place]//A.C., Onset Date: [id number]  . Iohexol Other (See Comments)     Code: HIVES, Desc: HIVES W/ "DYE" USED FOR 1ST CT SCAN BUT NOT 2ND, NO PREMEDS USED, PT UNCERTAIN OF CIRCUMSTANCES,,?POSSIBLE MRI CONTRAST ALLERGY, ALL STUDIES DONE "SOMEWHERE" IN [place]//A.C., Onset Date: [id number]     Current Outpatient Medications  Medication Sig Dispense Refill  . albuterol (PROVENTIL HFA;VENTOLIN HFA) 108 (90 Base) MCG/ACT inhaler Inhale 2 puffs into the lungs every 6 (six) hours as needed for wheezing or shortness of breath.     [name] apixaban (ELIQUIS) 2.5 MG TABS tablet Take 1 tablet (2.5 mg total) by mouth 2 (two) times daily. 60 tablet 5  . Ascorbic Acid (VITAMIN C) 1000 MG tablet Take 1,000 mg by mouth every other day.    . calcitRIOL (ROCALTROL) 0.5 MCG capsule Take 1 capsule (0.5 mcg total) by mouth every other day. 30 capsule 2  . ciprofloxacin-dexamethasone (CIPRODEX) OTIC suspension Place 4 drops into the right ear 2 (two) times daily. X 3 days (Patient not taking: Reported on 06/11/2018) 7.5 mL 0  . diphenhydrAMINE (BENADRYL) 25 mg capsule Take 25 mg by mouth daily as needed for allergies.    . multivitamin (RENA-VIT) TABS tablet Take 1 tablet by mouth at bedtime. 30 tablet 3  . mupirocin ointment (BACTROBAN) 2 % Apply 1 application  topically daily.    . NYSTATIN powder Apply to affected area twice daily as needed for infection  2  . pramipexole (MIRAPEX) 0.125 MG tablet Take 1 tablet (0.125 mg total) by mouth 3 (three) times daily. 90 tablet 1  . promethazine (PHENERGAN) 12.5 MG tablet Take 1 tablet (12.5 mg total) by mouth every 6 (six) hours as needed for nausea or vomiting. 30 tablet 0   No current facility-administered medications for this visit.      ROS:  See HPI  Physical Exam: Pt refused vital signs   Incision:  Healed nicely Extremities:  There is no thrill or bruit within the fistula; left radial pulse is palpable; motor and sensation are in tact   Assessment/Plan:  This is a 62 y.o. female who is s/p: Left brachial cephalic AV fistula by Dr. Donzetta Matters on 05/05/18  -pt's fistula is occluded. I reviewed her vein mapping with Dr. Donnetta Hutching and will plan for a LUA AVG with Dr. Donzetta Matters on 07/09/18.   -pt very upset today and overwhelmed with dialysis and multiple doctors appointments, but understands that her fistula is occluded and needs access.   Discussed with her that Dr. Donzetta Matters will look at the basilic vein while she is in the OR and if he thinks this is feasible, he can try a first stage BVT, but understands that this could also occlude and would ultimately need another new access.  I also discussed with her that if he did do a 1st stage basilic vein transposition, she would need further surgery to superficialize the fistula.  She also understands that if that didn't work, she would end up with a graft.  Also explained to her that if she does end up with an AV graft, this may require maintenance to keep it working in the future.  -she is on Eliquis and this will need to be stopped prior to the procedure.    Leontine Locket, PA-C [facility] [phone]  Clinic MD:  Early

## 2018-06-17 DIAGNOSIS — D631 Anemia in chronic kidney disease: Secondary | ICD-10-CM | POA: Diagnosis not present

## 2018-06-17 DIAGNOSIS — N2581 Secondary hyperparathyroidism of renal origin: Secondary | ICD-10-CM | POA: Diagnosis not present

## 2018-06-17 DIAGNOSIS — N186 End stage renal disease: Secondary | ICD-10-CM | POA: Diagnosis not present

## 2018-06-17 DIAGNOSIS — D509 Iron deficiency anemia, unspecified: Secondary | ICD-10-CM | POA: Diagnosis not present

## 2018-06-19 DIAGNOSIS — N186 End stage renal disease: Secondary | ICD-10-CM | POA: Diagnosis not present

## 2018-06-19 DIAGNOSIS — D631 Anemia in chronic kidney disease: Secondary | ICD-10-CM | POA: Diagnosis not present

## 2018-06-19 DIAGNOSIS — N2581 Secondary hyperparathyroidism of renal origin: Secondary | ICD-10-CM | POA: Diagnosis not present

## 2018-06-19 DIAGNOSIS — D509 Iron deficiency anemia, unspecified: Secondary | ICD-10-CM | POA: Diagnosis not present

## 2018-06-22 DIAGNOSIS — D509 Iron deficiency anemia, unspecified: Secondary | ICD-10-CM | POA: Diagnosis not present

## 2018-06-22 DIAGNOSIS — N2581 Secondary hyperparathyroidism of renal origin: Secondary | ICD-10-CM | POA: Diagnosis not present

## 2018-06-22 DIAGNOSIS — N186 End stage renal disease: Secondary | ICD-10-CM | POA: Diagnosis not present

## 2018-06-22 DIAGNOSIS — D631 Anemia in chronic kidney disease: Secondary | ICD-10-CM | POA: Diagnosis not present

## 2018-06-24 DIAGNOSIS — N186 End stage renal disease: Secondary | ICD-10-CM | POA: Diagnosis not present

## 2018-06-24 DIAGNOSIS — D509 Iron deficiency anemia, unspecified: Secondary | ICD-10-CM | POA: Diagnosis not present

## 2018-06-24 DIAGNOSIS — D631 Anemia in chronic kidney disease: Secondary | ICD-10-CM | POA: Diagnosis not present

## 2018-06-24 DIAGNOSIS — N2581 Secondary hyperparathyroidism of renal origin: Secondary | ICD-10-CM | POA: Diagnosis not present

## 2018-06-26 DIAGNOSIS — N186 End stage renal disease: Secondary | ICD-10-CM | POA: Diagnosis not present

## 2018-06-26 DIAGNOSIS — N2581 Secondary hyperparathyroidism of renal origin: Secondary | ICD-10-CM | POA: Diagnosis not present

## 2018-06-26 DIAGNOSIS — D509 Iron deficiency anemia, unspecified: Secondary | ICD-10-CM | POA: Diagnosis not present

## 2018-06-26 DIAGNOSIS — D631 Anemia in chronic kidney disease: Secondary | ICD-10-CM | POA: Diagnosis not present

## 2018-06-29 DIAGNOSIS — N186 End stage renal disease: Secondary | ICD-10-CM | POA: Diagnosis not present

## 2018-06-29 DIAGNOSIS — D509 Iron deficiency anemia, unspecified: Secondary | ICD-10-CM | POA: Diagnosis not present

## 2018-06-29 DIAGNOSIS — N2581 Secondary hyperparathyroidism of renal origin: Secondary | ICD-10-CM | POA: Diagnosis not present

## 2018-06-29 DIAGNOSIS — D631 Anemia in chronic kidney disease: Secondary | ICD-10-CM | POA: Diagnosis not present

## 2018-06-29 DIAGNOSIS — Z992 Dependence on renal dialysis: Secondary | ICD-10-CM | POA: Diagnosis not present

## 2018-06-29 DIAGNOSIS — I129 Hypertensive chronic kidney disease with stage 1 through stage 4 chronic kidney disease, or unspecified chronic kidney disease: Secondary | ICD-10-CM | POA: Diagnosis not present

## 2018-07-01 DIAGNOSIS — N186 End stage renal disease: Secondary | ICD-10-CM | POA: Diagnosis not present

## 2018-07-01 DIAGNOSIS — N2581 Secondary hyperparathyroidism of renal origin: Secondary | ICD-10-CM | POA: Diagnosis not present

## 2018-07-01 DIAGNOSIS — D631 Anemia in chronic kidney disease: Secondary | ICD-10-CM | POA: Diagnosis not present

## 2018-07-01 DIAGNOSIS — D509 Iron deficiency anemia, unspecified: Secondary | ICD-10-CM | POA: Diagnosis not present

## 2018-07-03 DIAGNOSIS — D631 Anemia in chronic kidney disease: Secondary | ICD-10-CM | POA: Diagnosis not present

## 2018-07-03 DIAGNOSIS — N2581 Secondary hyperparathyroidism of renal origin: Secondary | ICD-10-CM | POA: Diagnosis not present

## 2018-07-03 DIAGNOSIS — D509 Iron deficiency anemia, unspecified: Secondary | ICD-10-CM | POA: Diagnosis not present

## 2018-07-03 DIAGNOSIS — N186 End stage renal disease: Secondary | ICD-10-CM | POA: Diagnosis not present

## 2018-07-06 DIAGNOSIS — N186 End stage renal disease: Secondary | ICD-10-CM | POA: Diagnosis not present

## 2018-07-06 DIAGNOSIS — N2581 Secondary hyperparathyroidism of renal origin: Secondary | ICD-10-CM | POA: Diagnosis not present

## 2018-07-06 DIAGNOSIS — D631 Anemia in chronic kidney disease: Secondary | ICD-10-CM | POA: Diagnosis not present

## 2018-07-06 DIAGNOSIS — D509 Iron deficiency anemia, unspecified: Secondary | ICD-10-CM | POA: Diagnosis not present

## 2018-07-07 ENCOUNTER — Encounter (HOSPITAL_COMMUNITY): Payer: Self-pay | Admitting: *Deleted

## 2018-07-07 ENCOUNTER — Ambulatory Visit: Payer: Medicare Other | Admitting: Psychiatry

## 2018-07-07 ENCOUNTER — Other Ambulatory Visit: Payer: Self-pay

## 2018-07-07 NOTE — Progress Notes (Signed)
Whitney Walker denies chest pain or shortness of breath at rest.  Patient took Eliquis this am, last dose was supposed to have been Monday, July 8.  I sent Dr. Donzetta Matters an inbox message.

## 2018-07-08 ENCOUNTER — Telehealth: Payer: Self-pay | Admitting: *Deleted

## 2018-07-08 DIAGNOSIS — N2581 Secondary hyperparathyroidism of renal origin: Secondary | ICD-10-CM | POA: Diagnosis not present

## 2018-07-08 DIAGNOSIS — N186 End stage renal disease: Secondary | ICD-10-CM | POA: Diagnosis not present

## 2018-07-08 DIAGNOSIS — D631 Anemia in chronic kidney disease: Secondary | ICD-10-CM | POA: Diagnosis not present

## 2018-07-08 DIAGNOSIS — D509 Iron deficiency anemia, unspecified: Secondary | ICD-10-CM | POA: Diagnosis not present

## 2018-07-08 NOTE — Telephone Encounter (Signed)
Call to patient and reviewed pre-op instructions and time/ date change for surgery. To be at [facility] admitting at 8:30 am on 07/16/18. NPO past MN must have a driver for home. She is unsure at this time if she will have caregiver for overnight. Informed her she would have to stay overnight obs. If no one at home. Restart Eliquis for now and hold for 2 days pre-op. Follow the detailed pre-op instructions she receives from the pre-admissions department about this surgery. Verbalized understanding.

## 2018-07-10 DIAGNOSIS — N186 End stage renal disease: Secondary | ICD-10-CM | POA: Diagnosis not present

## 2018-07-10 DIAGNOSIS — D509 Iron deficiency anemia, unspecified: Secondary | ICD-10-CM | POA: Diagnosis not present

## 2018-07-10 DIAGNOSIS — D631 Anemia in chronic kidney disease: Secondary | ICD-10-CM | POA: Diagnosis not present

## 2018-07-10 DIAGNOSIS — N2581 Secondary hyperparathyroidism of renal origin: Secondary | ICD-10-CM | POA: Diagnosis not present

## 2018-07-13 DIAGNOSIS — D631 Anemia in chronic kidney disease: Secondary | ICD-10-CM | POA: Diagnosis not present

## 2018-07-13 DIAGNOSIS — N186 End stage renal disease: Secondary | ICD-10-CM | POA: Diagnosis not present

## 2018-07-13 DIAGNOSIS — D509 Iron deficiency anemia, unspecified: Secondary | ICD-10-CM | POA: Diagnosis not present

## 2018-07-13 DIAGNOSIS — N2581 Secondary hyperparathyroidism of renal origin: Secondary | ICD-10-CM | POA: Diagnosis not present

## 2018-07-14 NOTE — Progress Notes (Signed)
Pt stated that last dose of Eliquis was Tuesday 07/13/18 as instructed when updated with new arrival time.

## 2018-07-15 DIAGNOSIS — N186 End stage renal disease: Secondary | ICD-10-CM | POA: Diagnosis not present

## 2018-07-15 DIAGNOSIS — N2581 Secondary hyperparathyroidism of renal origin: Secondary | ICD-10-CM | POA: Diagnosis not present

## 2018-07-15 DIAGNOSIS — D631 Anemia in chronic kidney disease: Secondary | ICD-10-CM | POA: Diagnosis not present

## 2018-07-15 DIAGNOSIS — D509 Iron deficiency anemia, unspecified: Secondary | ICD-10-CM | POA: Diagnosis not present

## 2018-07-16 ENCOUNTER — Encounter (HOSPITAL_COMMUNITY)
Admission: RE | Disposition: A | Discharge status: Home / Self Care | Payer: Self-pay | Source: Ambulatory Visit | Attending: Vascular Surgery

## 2018-07-16 ENCOUNTER — Encounter (HOSPITAL_COMMUNITY): Payer: Self-pay

## 2018-07-16 ENCOUNTER — Other Ambulatory Visit: Payer: Self-pay

## 2018-07-16 ENCOUNTER — Inpatient Hospital Stay (HOSPITAL_COMMUNITY)
Admission: RE | Admit: 2018-07-16 | Discharge: 2018-07-17 | DRG: 673 | Disposition: A | Discharge status: Home / Self Care | Payer: Medicare Other | Source: Ambulatory Visit | Attending: Vascular Surgery | Admitting: Vascular Surgery

## 2018-07-16 ENCOUNTER — Ambulatory Visit (HOSPITAL_COMMUNITY): Payer: Medicare Other | Admitting: Anesthesiology

## 2018-07-16 DIAGNOSIS — Z85038 Personal history of other malignant neoplasm of large intestine: Secondary | ICD-10-CM

## 2018-07-16 DIAGNOSIS — Z8661 Personal history of infections of the central nervous system: Secondary | ICD-10-CM | POA: Diagnosis not present

## 2018-07-16 DIAGNOSIS — N186 End stage renal disease: Secondary | ICD-10-CM | POA: Diagnosis present

## 2018-07-16 DIAGNOSIS — I120 Hypertensive chronic kidney disease with stage 5 chronic kidney disease or end stage renal disease: Secondary | ICD-10-CM | POA: Diagnosis present

## 2018-07-16 DIAGNOSIS — F17200 Nicotine dependence, unspecified, uncomplicated: Secondary | ICD-10-CM | POA: Diagnosis present

## 2018-07-16 DIAGNOSIS — D509 Iron deficiency anemia, unspecified: Secondary | ICD-10-CM | POA: Diagnosis not present

## 2018-07-16 DIAGNOSIS — I2510 Atherosclerotic heart disease of native coronary artery without angina pectoris: Secondary | ICD-10-CM | POA: Diagnosis present

## 2018-07-16 DIAGNOSIS — I132 Hypertensive heart and chronic kidney disease with heart failure and with stage 5 chronic kidney disease, or end stage renal disease: Secondary | ICD-10-CM | POA: Diagnosis not present

## 2018-07-16 DIAGNOSIS — J449 Chronic obstructive pulmonary disease, unspecified: Secondary | ICD-10-CM | POA: Diagnosis present

## 2018-07-16 DIAGNOSIS — N2581 Secondary hyperparathyroidism of renal origin: Secondary | ICD-10-CM | POA: Diagnosis not present

## 2018-07-16 DIAGNOSIS — Z992 Dependence on renal dialysis: Secondary | ICD-10-CM | POA: Diagnosis present

## 2018-07-16 DIAGNOSIS — N185 Chronic kidney disease, stage 5: Secondary | ICD-10-CM | POA: Diagnosis not present

## 2018-07-16 DIAGNOSIS — Z7901 Long term (current) use of anticoagulants: Secondary | ICD-10-CM | POA: Diagnosis not present

## 2018-07-16 DIAGNOSIS — Z86711 Personal history of pulmonary embolism: Secondary | ICD-10-CM | POA: Diagnosis not present

## 2018-07-16 DIAGNOSIS — I5032 Chronic diastolic (congestive) heart failure: Secondary | ICD-10-CM | POA: Diagnosis not present

## 2018-07-16 DIAGNOSIS — G4730 Sleep apnea, unspecified: Secondary | ICD-10-CM | POA: Diagnosis present

## 2018-07-16 DIAGNOSIS — D631 Anemia in chronic kidney disease: Secondary | ICD-10-CM | POA: Diagnosis not present

## 2018-07-16 HISTORY — PX: AV FISTULA PLACEMENT: SHX1204

## 2018-07-16 HISTORY — DX: Restless legs syndrome: G25.81

## 2018-07-16 HISTORY — DX: Adverse effect of unspecified anesthetic, initial encounter: T41.45XA

## 2018-07-16 HISTORY — DX: Other complications of anesthesia, initial encounter: T88.59XA

## 2018-07-16 HISTORY — DX: Personal history of other medical treatment: Z92.89

## 2018-07-16 LAB — POCT I-STAT 4, (NA,K, GLUC, HGB,HCT)
Glucose, Bld: 99 mg/dL (ref 70–99)
HCT: 38 % (ref 36.0–46.0)
Hemoglobin: 12.9 g/dL (ref 12.0–15.0)
Potassium: 4.6 mmol/L (ref 3.5–5.1)
Sodium: 139 mmol/L (ref 135–145)

## 2018-07-16 LAB — PROTIME-INR
INR: 0.98
Prothrombin Time: 12.9 seconds (ref 11.4–15.2)

## 2018-07-16 SURGERY — INSERTION OF ARTERIOVENOUS (AV) GORE-TEX GRAFT ARM
Anesthesia: General | Site: Arm Upper | Laterality: Left

## 2018-07-16 MED ORDER — FERRIC CITRATE 1 GM 210 MG(FE) PO TABS
210.0000 mg | ORAL_TABLET | Freq: Three times a day (TID) | ORAL | Status: DC
  Filled 2018-07-16 (×3): qty 2

## 2018-07-16 MED ORDER — POTASSIUM CHLORIDE CRYS ER 20 MEQ PO TBCR: 20.0000 meq | EXTENDED_RELEASE_TABLET | Freq: Once | ORAL | Status: DC

## 2018-07-16 MED ORDER — POLYETHYLENE GLYCOL 3350 17 G PO PACK: 17.0000 g | PACK | Freq: Every day | ORAL | Status: DC | PRN

## 2018-07-16 MED ORDER — LIDOCAINE HCL (CARDIAC) PF 100 MG/5ML IV SOSY
PREFILLED_SYRINGE | INTRAVENOUS | Status: DC | PRN
  Administered 2018-07-16: 100 mg via INTRAVENOUS

## 2018-07-16 MED ORDER — PHENYLEPHRINE 40 MCG/ML (10ML) SYRINGE FOR IV PUSH (FOR BLOOD PRESSURE SUPPORT)
PREFILLED_SYRINGE | INTRAVENOUS | Status: AC
  Filled 2018-07-16: qty 10

## 2018-07-16 MED ORDER — FERRIC CITRATE 1 GM 210 MG(FE) PO TABS
210.0000 mg | ORAL_TABLET | Freq: Two times a day (BID) | ORAL | Status: DC | PRN
  Filled 2018-07-16: qty 1

## 2018-07-16 MED ORDER — OXYCODONE HCL 5 MG PO TABS: 5.0000 mg | ORAL_TABLET | Freq: Four times a day (QID) | ORAL | 0 refills | Status: DC | PRN

## 2018-07-16 MED ORDER — PANTOPRAZOLE SODIUM 40 MG PO TBEC: 40.0000 mg | DELAYED_RELEASE_TABLET | Freq: Every day | ORAL | Status: DC

## 2018-07-16 MED ORDER — FENTANYL CITRATE (PF) 100 MCG/2ML IJ SOLN
25.0000 ug | INTRAMUSCULAR | Status: DC | PRN
  Administered 2018-07-16 (×2): 50 ug via INTRAVENOUS

## 2018-07-16 MED ORDER — ALBUTEROL SULFATE (2.5 MG/3ML) 0.083% IN NEBU: 3.0000 mL | INHALATION_SOLUTION | Freq: Four times a day (QID) | RESPIRATORY_TRACT | Status: DC | PRN

## 2018-07-16 MED ORDER — ONDANSETRON HCL 4 MG/2ML IJ SOLN: 4.0000 mg | Freq: Four times a day (QID) | INTRAMUSCULAR | Status: DC | PRN

## 2018-07-16 MED ORDER — SODIUM CHLORIDE 0.9 % IV SOLN
INTRAVENOUS | Status: DC
  Administered 2018-07-16: 14:00:00 via INTRAVENOUS

## 2018-07-16 MED ORDER — ONDANSETRON HCL 4 MG/2ML IJ SOLN
INTRAMUSCULAR | Status: AC
  Filled 2018-07-16: qty 2

## 2018-07-16 MED ORDER — LABETALOL HCL 5 MG/ML IV SOLN: 10.0000 mg | INTRAVENOUS | Status: DC | PRN

## 2018-07-16 MED ORDER — OXYCODONE HCL 5 MG PO TABS
5.0000 mg | ORAL_TABLET | Freq: Four times a day (QID) | ORAL | Status: DC | PRN
  Administered 2018-07-16 – 2018-07-17 (×3): 5 mg via ORAL
  Filled 2018-07-16 (×3): qty 1

## 2018-07-16 MED ORDER — MUPIROCIN 2 % EX OINT: 1.0000 "application " | TOPICAL_OINTMENT | Freq: Every day | CUTANEOUS | Status: DC | PRN

## 2018-07-16 MED ORDER — APIXABAN 2.5 MG PO TABS: 2.5000 mg | ORAL_TABLET | Freq: Two times a day (BID) | ORAL | Status: DC

## 2018-07-16 MED ORDER — SODIUM CHLORIDE 0.9 % IV SOLN
INTRAVENOUS | Status: AC
  Filled 2018-07-16: qty 1.2

## 2018-07-16 MED ORDER — LIDOCAINE 2% (20 MG/ML) 5 ML SYRINGE
INTRAMUSCULAR | Status: AC
  Filled 2018-07-16: qty 5

## 2018-07-16 MED ORDER — SODIUM CHLORIDE 0.9 % IV SOLN
INTRAVENOUS | Status: DC | PRN
  Administered 2018-07-16: 14:00:00

## 2018-07-16 MED ORDER — OXYCODONE HCL 5 MG PO TABS: 5.0000 mg | ORAL_TABLET | Freq: Once | ORAL | Status: DC | PRN

## 2018-07-16 MED ORDER — BISACODYL 10 MG RE SUPP: 10.0000 mg | Freq: Every day | RECTAL | Status: DC | PRN

## 2018-07-16 MED ORDER — FENTANYL CITRATE (PF) 250 MCG/5ML IJ SOLN
INTRAMUSCULAR | Status: AC
  Filled 2018-07-16: qty 5

## 2018-07-16 MED ORDER — FENTANYL CITRATE (PF) 100 MCG/2ML IJ SOLN
INTRAMUSCULAR | Status: DC | PRN
  Administered 2018-07-16 (×2): 25 ug via INTRAVENOUS

## 2018-07-16 MED ORDER — MIDAZOLAM HCL 2 MG/2ML IJ SOLN
INTRAMUSCULAR | Status: AC
  Filled 2018-07-16: qty 2

## 2018-07-16 MED ORDER — PROPOFOL 10 MG/ML IV BOLUS
INTRAVENOUS | Status: DC | PRN
  Administered 2018-07-16: 150 mg via INTRAVENOUS
  Administered 2018-07-16: 50 mg via INTRAVENOUS

## 2018-07-16 MED ORDER — PROPOFOL 10 MG/ML IV BOLUS
INTRAVENOUS | Status: AC
  Filled 2018-07-16: qty 20

## 2018-07-16 MED ORDER — HYDRALAZINE HCL 20 MG/ML IJ SOLN: 5.0000 mg | INTRAMUSCULAR | Status: DC | PRN

## 2018-07-16 MED ORDER — METOPROLOL TARTRATE 5 MG/5ML IV SOLN: 2.0000 mg | INTRAVENOUS | Status: DC | PRN

## 2018-07-16 MED ORDER — OXYCODONE HCL 5 MG/5ML PO SOLN: 5.0000 mg | Freq: Once | ORAL | Status: DC | PRN

## 2018-07-16 MED ORDER — GUAIFENESIN-DM 100-10 MG/5ML PO SYRP: 15.0000 mL | ORAL_SOLUTION | ORAL | Status: DC | PRN

## 2018-07-16 MED ORDER — ACETAMINOPHEN 325 MG PO TABS
325.0000 mg | ORAL_TABLET | ORAL | Status: DC | PRN
  Administered 2018-07-17: 650 mg via ORAL
  Filled 2018-07-16: qty 2

## 2018-07-16 MED ORDER — PHENOL 1.4 % MT LIQD: 1.0000 | OROMUCOSAL | Status: DC | PRN

## 2018-07-16 MED ORDER — ACETAMINOPHEN 325 MG RE SUPP: 325.0000 mg | RECTAL | Status: DC | PRN

## 2018-07-16 MED ORDER — ONDANSETRON HCL 4 MG/2ML IJ SOLN
INTRAMUSCULAR | Status: DC | PRN
  Administered 2018-07-16: 4 mg via INTRAVENOUS

## 2018-07-16 MED ORDER — 0.9 % SODIUM CHLORIDE (POUR BTL) OPTIME
TOPICAL | Status: DC | PRN
  Administered 2018-07-16: 1000 mL

## 2018-07-16 MED ORDER — PROMETHAZINE HCL 25 MG PO TABS: 12.5000 mg | ORAL_TABLET | Freq: Four times a day (QID) | ORAL | Status: DC | PRN

## 2018-07-16 MED ORDER — PHENYLEPHRINE 40 MCG/ML (10ML) SYRINGE FOR IV PUSH (FOR BLOOD PRESSURE SUPPORT)
PREFILLED_SYRINGE | INTRAVENOUS | Status: DC | PRN
  Administered 2018-07-16 (×5): 80 ug via INTRAVENOUS

## 2018-07-16 MED ORDER — PRAMIPEXOLE DIHYDROCHLORIDE 0.25 MG PO TABS
0.1250 mg | ORAL_TABLET | Freq: Every day | ORAL | Status: DC
  Administered 2018-07-16: 0.125 mg via ORAL
  Filled 2018-07-16: qty 1

## 2018-07-16 MED ORDER — ONDANSETRON HCL 4 MG/2ML IJ SOLN: 4.0000 mg | Freq: Once | INTRAMUSCULAR | Status: DC | PRN

## 2018-07-16 MED ORDER — RENA-VITE PO TABS
1.0000 | ORAL_TABLET | Freq: Every day | ORAL | Status: DC
  Administered 2018-07-16: 1 via ORAL
  Filled 2018-07-16: qty 1

## 2018-07-16 MED ORDER — CEFAZOLIN SODIUM-DEXTROSE 2-4 GM/100ML-% IV SOLN
2.0000 g | INTRAVENOUS | Status: AC
  Administered 2018-07-16: 2 g via INTRAVENOUS
  Filled 2018-07-16: qty 100

## 2018-07-16 MED ORDER — CALCITRIOL 0.25 MCG PO CAPS: 0.5000 ug | ORAL_CAPSULE | ORAL | Status: DC

## 2018-07-16 SURGICAL SUPPLY — 36 items
ARMBAND PINK RESTRICT EXTREMIT (MISCELLANEOUS) ×4 IMPLANT
CANISTER SUCT 3000ML PPV (MISCELLANEOUS) ×2 IMPLANT
CLIP VESOCCLUDE MED 6/CT (CLIP) ×2 IMPLANT
CLIP VESOCCLUDE SM WIDE 6/CT (CLIP) ×2 IMPLANT
DERMABOND ADHESIVE PROPEN (GAUZE/BANDAGES/DRESSINGS) ×1
DERMABOND ADVANCED (GAUZE/BANDAGES/DRESSINGS) ×1
DERMABOND ADVANCED .7 DNX12 (GAUZE/BANDAGES/DRESSINGS) ×1 IMPLANT
DERMABOND ADVANCED .7 DNX6 (GAUZE/BANDAGES/DRESSINGS) ×1 IMPLANT
ELECT REM PT RETURN 9FT ADLT (ELECTROSURGICAL) ×2
ELECTRODE REM PT RTRN 9FT ADLT (ELECTROSURGICAL) ×1 IMPLANT
GLOVE BIO SURGEON STRL SZ 6.5 (GLOVE) ×4 IMPLANT
GLOVE BIO SURGEON STRL SZ7.5 (GLOVE) ×2 IMPLANT
GLOVE BIOGEL PI IND STRL 6.5 (GLOVE) ×1 IMPLANT
GLOVE BIOGEL PI INDICATOR 6.5 (GLOVE) ×1
GOWN STRL REUS W/ TWL LRG LVL3 (GOWN DISPOSABLE) ×2 IMPLANT
GOWN STRL REUS W/ TWL XL LVL3 (GOWN DISPOSABLE) ×1 IMPLANT
GOWN STRL REUS W/TWL LRG LVL3 (GOWN DISPOSABLE) ×2
GOWN STRL REUS W/TWL XL LVL3 (GOWN DISPOSABLE) ×1
HEMOSTAT SNOW SURGICEL 2X4 (HEMOSTASIS) IMPLANT
INSERT FOGARTY SM (MISCELLANEOUS) ×2 IMPLANT
KIT BASIN OR (CUSTOM PROCEDURE TRAY) ×2 IMPLANT
KIT TURNOVER KIT B (KITS) ×2 IMPLANT
NS IRRIG 1000ML POUR BTL (IV SOLUTION) ×2 IMPLANT
PACK CV ACCESS (CUSTOM PROCEDURE TRAY) ×2 IMPLANT
PAD ARMBOARD 7.5X6 YLW CONV (MISCELLANEOUS) ×4 IMPLANT
SUT GORETEX 6.0 TH-9 30 IN (SUTURE) IMPLANT
SUT GORETEX CV-6TTC-13 36IN (SUTURE) IMPLANT
SUT MNCRL AB 4-0 PS2 18 (SUTURE) IMPLANT
SUT PROLENE 6 0 BV (SUTURE) IMPLANT
SUT SILK 2 0 SH (SUTURE) IMPLANT
SUT VIC AB 3-0 SH 27 (SUTURE) ×2
SUT VIC AB 3-0 SH 27X BRD (SUTURE) ×2 IMPLANT
SYR TOOMEY 50ML (SYRINGE) IMPLANT
TOWEL GREEN STERILE (TOWEL DISPOSABLE) ×2 IMPLANT
UNDERPAD 30X30 (UNDERPADS AND DIAPERS) ×2 IMPLANT
WATER STERILE IRR 1000ML POUR (IV SOLUTION) ×2 IMPLANT

## 2018-07-16 NOTE — Anesthesia Preprocedure Evaluation (Addendum)
Anesthesia Evaluation  Patient identified by MRN, date of birth, ID band Patient awake    Reviewed: Allergy & Precautions, NPO status , [name] Chart, lab work & pertinent test results  History of Anesthesia Complications Negative for: history of anesthetic complications  Airway Mallampati: I  TM Distance: >3 FB Neck ROM: Full    Dental  (+) Edentulous Upper, Edentulous Lower, Dental Advisory Given   Pulmonary sleep apnea and Continuous Positive Airway Pressure Ventilation , COPD, Current Smoker,    Pulmonary exam normal        Cardiovascular hypertension, + CAD  Normal cardiovascular exam     Neuro/Psych PSYCHIATRIC DISORDERS Anxiety Depression negative neurological ROS     GI/Hepatic   Endo/Other  negative endocrine ROS  Renal/GU ESRFRenal disease     Musculoskeletal   Abdominal   Peds  Hematology   Anesthesia Other Findings   Reproductive/Obstetrics                            Anesthesia Physical Anesthesia Plan  ASA: III  Anesthesia Plan: General   Post-op Pain Management:    Induction: Intravenous  PONV Risk Score and Plan: Ondansetron, Dexamethasone and Propofol infusion  Airway Management Planned: LMA  Additional Equipment:   Intra-op Plan:   Post-operative Plan: Extubation in OR  Informed Consent: I have reviewed the patients History and Physical, chart, labs and discussed the procedure including the risks, benefits and alternatives for the proposed anesthesia with the patient or authorized representative who has indicated his/her understanding and acceptance.   Dental advisory given  Plan Discussed with: CRNA and Anesthesiologist  Anesthesia Plan Comments:        Anesthesia Quick Evaluation

## 2018-07-16 NOTE — Discharge Instructions (Signed)
° °  [facility]  Discharge Instructions  AV Fistula or Graft Surgery for Dialysis Access  Please refer to the following instructions for your post-procedure care. Your surgeon or physician assistant will discuss any changes with you.  Activity  You may drive the day following your surgery, if you are comfortable and no longer taking prescription pain medication. Resume full activity as the soreness in your incision resolves.  Bathing/Showering  You may shower after you go home. Keep your incision dry for 48 hours. Do not soak in a bathtub, hot tub, or swim until the incision heals completely. You may not shower if you have a hemodialysis catheter.  Incision Care  Clean your incision with mild soap and water after 48 hours. Pat the area dry with a clean towel. You do not need a bandage unless otherwise instructed. Do not apply any ointments or creams to your incision. You may have skin glue on your incision. Do not peel it off. It will come off on its own in about one week. Your arm may swell a bit after surgery. To reduce swelling use pillows to elevate your arm so it is above your heart. Your doctor will tell you if you need to lightly wrap your arm with an ACE bandage.  Diet  Resume your normal diet. There are not special food restrictions following this procedure. In order to heal from your surgery, it is CRITICAL to get adequate nutrition. Your body requires vitamins, minerals, and protein. Vegetables are the best source of vitamins and minerals. Vegetables also provide the perfect balance of protein. Processed food has little nutritional value, so try to avoid this.  Medications  Resume taking all of your medications. If your incision is causing pain, you may take over-the counter pain relievers such as acetaminophen (Tylenol). If you were prescribed a stronger pain medication, please be aware these medications can cause nausea and constipation. Prevent  nausea by taking the medication with a snack or meal. Avoid constipation by drinking plenty of fluids and eating foods with high amount of fiber, such as fruits, vegetables, and grains.  Do not take Tylenol if you are taking prescription pain medications.  Follow up Your surgeon may want to see you in the office following your access surgery. If so, this will be arranged at the time of your surgery.  Please call us immediately for any of the following conditions:  Increased pain, redness, drainage (pus) from your incision site Fever of 101 degrees or higher Severe or worsening pain at your incision site Hand pain or numbness.  Reduce your risk of vascular disease:  Stop smoking. If you would like help, call [facility] at 1-800-QUIT-NOW 925-509-3886) or [place] at [place] your cholesterol Maintain a desired weight Control your diabetes Keep your blood pressure down  Dialysis  It will take several weeks to several months for your new dialysis access to be ready for use. Your surgeon will determine when it is okay to use it. Your nephrologist will continue to direct your dialysis. You can continue to use your Permcath until your new access is ready for use.   07/16/2018 Gennie Eisinger [id number] Mar 16, 1956  Surgeon(s): Waynetta Sandy, MD  Procedure(s): 1st stage basilic vein transposition left arm  x Do not stick fistula for 12 weeks    If you have any questions, please call the office at [phone].

## 2018-07-16 NOTE — Op Note (Signed)
    Patient name: [name] MRN: [id number] DOB: 11/15/1956 Sex: female  07/16/2018 Pre-operative Diagnosis: End-stage renal disease Post-operative diagnosis:  Same Surgeon:  Erlene Quan C. Donzetta Matters, MD Assistant: Leontine Locket, PA Procedure Performed: Left arm first stage basilic vein fistula  Indications: 62 year old female who is right-hand dominant has a previous failed cephalic vein fistula on the left.  She is now indicated for fistula versus graft.  Findings: The left arm basilic vein was 4 mm in diameter above the antecubitum.  This easily dilated to 4 was connected to what is likely an aberrant radial artery.  At completion there was a strong signal in the runoff vein as well as a strong radial artery signal that did diminish with compression of the fistula.  There was no augmentation of the ulnar artery signal.   Procedure:  The patient was identified in the holding area and taken to the operating room where she is placed supine on the operative table and general anesthesia was induced.  She was sterilely prepped and draped in the left upper extremity usual fashion given antibiotics and a timeout was called.  I used ultrasound identified a large basilic vein that measured 4 mm in diameter.  I then made a transverse incision between that and the palpable brachial artery pulse above the antecubitum.  First dissected out the vein divided branches between clips and ties marker for orientation.  I then dissected deeper through the fascia identified the artery placed a vessel loop around this.  This artery was approximately 2.21mm external diameter and I thought was likely an aberrant anatomy but we were above the antecubitum.   I then clamped the artery distally and proximally opened longitudinally flushed with heparinized saline both directions.  The vein was divided distally dilated to 4 mm flushed with heparinized saline.  The antecubitum was tied off with 2-0 silk suture.  I then sewed the vein  end-to-side with 6-0 Prolene suture.  Prior to completing anastomosis I allowed flushing all directions flushed with heparinized saline.  Upon completion he had pulsatility was palpable in the vein and I had a strong signal I also signal at the radial artery at the wrist and augmented with compression of the vein.  The arterial structure was very tiny but this was anastomosed to his likely an aberrant radial artery given that the ulnar artery signal did not diminish with compression.  Satisfied we irrigated the wound and closed in 2 layers with Vicryl Monocryl.  Dermabond was placed to level skin.  She was allowed away from anesthesia having tolerated procedure without immediate comp occasion.   all counts were correct at completion.  EBL 20 cc.   Keilee Denman C. Donzetta Matters, MD [facility] [place] Office: [phone] Pager: [phone]

## 2018-07-16 NOTE — Progress Notes (Addendum)
Per pt she goes to dialysis at [facility] on [address]. Has a HD appointment tomorrow 7/19 at 12pm.   Daughter works overnight and will not be off in time to take pt to dialysis. Pt states she does not have her clothes with her and is unagreeable to going straight to HD. She requests transportation to her home. Per pt she normally has transportation via '[place] transportation' to and from dialysis and does not think she cancelled it. So she would need only transportation home.  Daughter, Debby Freiberg, states she can be reached with concerns or updates at [place], RN

## 2018-07-16 NOTE — Progress Notes (Signed)
Pt received from PACU. VSS. Telemetry applied, CCMD notified x2. CHG bath complete. Pt complains of pain, pharmacy called to verify meds. Pt's daughter at bedside - will continue to monitor.   Fritz Pickerel, RN

## 2018-07-16 NOTE — H&P (Signed)
  POST OPERATIVE OFFICE NOTE    CC:  F/u for surgery  HPI:  This is a 62 y.o. female who is s/p left brachial cephalic AV fistula by Dr. Donzetta Matters on 05/05/18.  She had removal of her temporary dialysis catheter and had tunneled dialysis catheter placed by Dr. Trula Slade on 04/30/18.  She returns today for her duplex and f/u with PA.  She denies any pain in her hand or numbness.    She is on Eliquis.        Allergies  Allergen Reactions  . Gadolinium Derivatives Hives and Other (See Comments)    HIVES, Desc: HIVES W/ "DYE" USED FOR 1ST CT SCAN BUT NOT 2ND, NO PREMEDS USED, PT UNCERTAIN OF CIRCUMSTANCES,,?POSSIBLE MRI CONTRAST ALLERGY, ALL STUDIES DONE "SOMEWHERE" IN [place]//A.C., Onset Date: [id number]  . Iohexol Other (See Comments)     Code: HIVES, Desc: HIVES W/ "DYE" USED FOR 1ST CT SCAN BUT NOT 2ND, NO PREMEDS USED, PT UNCERTAIN OF CIRCUMSTANCES,,?POSSIBLE MRI CONTRAST ALLERGY, ALL STUDIES DONE "SOMEWHERE" IN [place]//A.C., Onset Date: 47829562           Current Outpatient Medications  Medication Sig Dispense Refill  . albuterol (PROVENTIL HFA;VENTOLIN HFA) 108 (90 Base) MCG/ACT inhaler Inhale 2 puffs into the lungs every 6 (six) hours as needed for wheezing or shortness of breath.     [name] apixaban (ELIQUIS) 2.5 MG TABS tablet Take 1 tablet (2.5 mg total) by mouth 2 (two) times daily. 60 tablet 5  . Ascorbic Acid (VITAMIN C) 1000 MG tablet Take 1,000 mg by mouth every other day.    . calcitRIOL (ROCALTROL) 0.5 MCG capsule Take 1 capsule (0.5 mcg total) by mouth every other day. 30 capsule 2  . ciprofloxacin-dexamethasone (CIPRODEX) OTIC suspension Place 4 drops into the right ear 2 (two) times daily. X 3 days (Patient not taking: Reported on 06/11/2018) 7.5 mL 0  . diphenhydrAMINE (BENADRYL) 25 mg capsule Take 25 mg by mouth daily as needed for allergies.    . multivitamin (RENA-VIT) TABS tablet Take 1 tablet by mouth at bedtime. 30 tablet 3  . mupirocin ointment  (BACTROBAN) 2 % Apply 1 application topically daily.    . NYSTATIN powder Apply to affected area twice daily as needed for infection  2  . pramipexole (MIRAPEX) 0.125 MG tablet Take 1 tablet (0.125 mg total) by mouth 3 (three) times daily. 90 tablet 1  . promethazine (PHENERGAN) 12.5 MG tablet Take 1 tablet (12.5 mg total) by mouth every 6 (six) hours as needed for nausea or vomiting. 30 tablet 0   No current facility-administered medications for this visit.      ROS:  See HPI  Physical Exam: Pt refused vital signs  Incision:  Healed nicely Extremities:  There is no thrill or bruit within the fistula; left radial pulse is palpable; motor and sensation are in tact   Assessment/Plan:  This is a 62 y.o. female who is s/p: Left brachial cephalic AV fistula on 01/01/07 Plan for left arm AV fistula versus graft.  Boyce Keltner C. Donzetta Matters, MD [facility] [place] Office: [phone] Pager: [phone]

## 2018-07-16 NOTE — Anesthesia Postprocedure Evaluation (Signed)
Anesthesia Post Note  Patient: Whitney Walker  Procedure(s) Performed: ARTERIOVENOUS FISTULA CREATION (Left Arm Upper)     Patient location during evaluation: PACU Anesthesia Type: General Level of consciousness: awake and alert Pain management: pain level controlled Vital Signs Assessment: post-procedure vital signs reviewed and stable Respiratory status: spontaneous breathing, nonlabored ventilation, respiratory function stable and patient connected to nasal cannula oxygen Cardiovascular status: blood pressure returned to baseline and stable Postop Assessment: no apparent nausea or vomiting Anesthetic complications: no    Last Vitals:  Vitals:   07/16/18 1455 07/16/18 1547  BP: (!) 158/72 140/76  Pulse: 86 79  Resp: 15 10  Temp:  36.9 C  SpO2: 100% 96%    Last Pain:  Vitals:   07/16/18 1635  TempSrc:   PainSc: 9                  Shemika Robbs COKER

## 2018-07-16 NOTE — Anesthesia Procedure Notes (Signed)
Procedure Name: LMA Insertion Date/Time: 07/16/2018 1:39 PM Performed by: Valda Favia, CRNA Pre-anesthesia Checklist: Patient identified, Emergency Drugs available, Suction available and Patient being monitored Patient Re-evaluated:Patient Re-evaluated prior to induction Oxygen Delivery Method: Circle System Utilized Preoxygenation: Pre-oxygenation with 100% oxygen Induction Type: IV induction Ventilation: Mask ventilation without difficulty LMA: LMA inserted LMA Size: 4.0 Number of attempts: 1 Airway Equipment and Method: Bite block Placement Confirmation: positive ETCO2 Tube secured with: Tape Dental Injury: Teeth and Oropharynx as per pre-operative assessment

## 2018-07-17 ENCOUNTER — Telehealth: Payer: Self-pay | Admitting: Vascular Surgery

## 2018-07-17 ENCOUNTER — Encounter (HOSPITAL_COMMUNITY): Payer: Self-pay | Admitting: Vascular Surgery

## 2018-07-17 DIAGNOSIS — N2581 Secondary hyperparathyroidism of renal origin: Secondary | ICD-10-CM | POA: Diagnosis not present

## 2018-07-17 DIAGNOSIS — D509 Iron deficiency anemia, unspecified: Secondary | ICD-10-CM | POA: Diagnosis not present

## 2018-07-17 DIAGNOSIS — N186 End stage renal disease: Secondary | ICD-10-CM | POA: Diagnosis not present

## 2018-07-17 DIAGNOSIS — D631 Anemia in chronic kidney disease: Secondary | ICD-10-CM | POA: Diagnosis not present

## 2018-07-17 NOTE — Discharge Summary (Signed)
Discharge Summary    Whitney Walker 08-Sep-1956 62 y.o. female  [id number]  Admission Date: 07/16/2018  Discharge Date: 07/17/18  Physician: Thomes Lolling*  Admission Diagnosis: end stage renal disease   HPI:   This is a 62 y.o. female who is s/p left brachial cephalic AV fistula by Dr. Donzetta Matters on 05/05/18. She had removal of her temporary dialysis catheter and had tunneled dialysis catheter placed by Dr. Trula Slade on 04/30/18. She returns today for her duplex and f/u with PA. She denies any pain in her hand or numbness.   She is on Eliquis.  Hospital Course:  The patient was admitted to the hospital and taken to the operating room on 07/16/2018 and underwent: 1st stage left BVT     Findings: The left arm basilic vein was 4 mm in diameter above the antecubitum.  This easily dilated to 4 was connected to what is likely an aberrant radial artery.  At completion there was a strong signal in the runoff vein as well as a strong radial artery signal that did diminish with compression of the fistula.  There was no augmentation of the ulnar artery signal.  The pt tolerated the procedure well and was transported to the PACU in good condition.   By POD 1, she was doing well and is discharged home.  The remainder of the hospital course consisted of increasing mobilization and increasing intake of solids without difficulty.  CBC    Component Value Date/Time   WBC 14.4 (H) 06/11/2018 1241   RBC 3.64 (L) 06/11/2018 1241   HGB 12.9 07/16/2018 1032   HGB 10.9 (L) 07/07/2017 1235   HCT 38.0 07/16/2018 1032   HCT 35.0 07/07/2017 1235   PLT 161 06/11/2018 1241   PLT 275 07/07/2017 1235   MCV 93.1 06/11/2018 1241   MCV 85.2 07/07/2017 1235   MCH 28.6 06/11/2018 1241   MCHC 30.7 (L) 06/11/2018 1241   RDW 17.0 (H) 06/11/2018 1241   RDW 15.8 (H) 07/07/2017 1235   LYMPHSABS 3.3 06/11/2018 1241   LYMPHSABS 2.4 07/07/2017 1235   MONOABS 0.7 06/11/2018 1241   MONOABS 0.8  07/07/2017 1235   EOSABS 0.2 06/11/2018 1241   EOSABS 0.2 07/07/2017 1235   BASOSABS 0.0 06/11/2018 1241   BASOSABS 0.0 07/07/2017 1235    BMET    Component Value Date/Time   NA 139 07/16/2018 1032   NA 138 07/07/2017 1235   K 4.6 07/16/2018 1032   K 4.5 07/07/2017 1235   CL 101 06/11/2018 1241   CL 111 (H) 12/04/2012 1056   CO2 25 06/11/2018 1241   CO2 17 (L) 07/07/2017 1235   GLUCOSE 99 07/16/2018 1032   GLUCOSE 85 07/07/2017 1235   GLUCOSE 97 12/04/2012 1056   BUN 33 (H) 06/11/2018 1241   BUN 40.8 (H) 07/07/2017 1235   CREATININE 6.05 (HH) 06/11/2018 1241   CREATININE 3.4 (HH) 07/07/2017 1235   CALCIUM 9.9 06/11/2018 1241   CALCIUM 9.5 07/07/2017 1235   GFRNONAA 7 (L) 06/11/2018 1241   GFRAA 8 (L) 06/11/2018 1241      Discharge Instructions    Discharge patient   Complete by:  As directed    Discharge disposition:  01-Home or Self Care   Discharge patient date:  07/17/2018      Discharge Diagnosis:  end stage renal disease  Secondary Diagnosis: Patient Active Problem List   Diagnosis Date Noted  . Hyperkalemia 04/29/2018  . OSA (obstructive sleep apnea) 04/29/2018  . ESRD (end stage  renal disease) ([place]) 04/29/2018  . Dyspnea on exertion 03/12/2017  . Morbid (severe) obesity due to excess calories ([place]) 03/12/2017  . Chest pain 03/02/2017  . Anemia of chronic kidney failure, stage 3 (moderate) ([place]) 12/10/2016  . History of colon cancer 08/12/2016  . Mass of left breast on mammogram 12/10/2015  . MDD (major depressive disorder), recurrent severe, without psychosis ([place]) 10/19/2015  . HTN (hypertension) 10/18/2015  . DVT (deep venous thrombosis) ([place]) 10/18/2015  . Depression 10/18/2015  . Pain, joint, lower leg, left 01/24/2015  . Numbness and tingling of left arm and leg 01/24/2015  . Aftercare following surgery of the circulatory system, [place] 07/19/2014  . PVD (peripheral vascular disease) with claudication ([place]) 03/23/2014  . Swelling of limb-Left  leg 03/23/2014  . Weakness-Left arm/Leg 03/23/2014  . PAD (peripheral artery disease) ([place]) 12/14/2013  . CKD (chronic kidney disease) stage 4, GFR 15-29 ml/min (H[facility]) 11/15/2013  . Chronic diastolic heart failure ([place]) 11/15/2013  . Essential hypertension 11/15/2013  . Pulmonary embolism ([place]) 11/14/2013  . Pain in limb 07/30/2013  . Atherosclerosis of native arteries of the extremities with ulceration(440.23) 07/30/2013  . Cirrhosis ([place]) 05/21/2012  . Leucocytosis 04/28/2012  . Colon cancer ([place]) 04/28/2012   Past Medical History:  Diagnosis Date  . Anemia   . Anxiety   . Arthritis   . CHF (congestive heart failure) ([place])   . CKD (chronic kidney disease)    Stage III 03/2013  . Colon cancer [facility])    treatment surgery  . Complication of anesthesia    after first C- [place] "couldnt walk after", patient denies having a spinal  . COPD (chronic obstructive pulmonary disease) ([place])   . Coronary artery disease   . Depression   . History of blood transfusion 04/2018  . Hypertension    07/07/18- no longer takes BP medications  . Meningitis   . PE (pulmonary embolism)   . Peripheral vascular disease ([place])   . Restless legs   . Shortness of breath    with exertion  . Sleep apnea   . Vertigo      Allergies as of 07/17/2018      Reactions   Gadolinium Derivatives Hives, Other (See Comments)   HIVES, Desc: HIVES W/ "DYE" USED FOR 1ST CT SCAN BUT NOT 2ND, NO PREMEDS USED, PT UNCERTAIN OF CIRCUMSTANCES,,?POSSIBLE MRI CONTRAST ALLERGY, ALL STUDIES DONE "SOMEWHERE" IN [place]//A.C., Onset Date: 51025852   Iohexol Other (See Comments)    Code: HIVES, Desc: HIVES W/ "DYE" USED FOR 1ST CT SCAN BUT NOT 2ND, NO PREMEDS USED, PT UNCERTAIN OF CIRCUMSTANCES,,?POSSIBLE MRI CONTRAST ALLERGY, ALL STUDIES DONE "SOMEWHERE" IN [place]//A.C., Onset Date: [id number]      Medication List    TAKE these medications   acetaminophen 500 MG tablet Commonly known as:  TYLENOL Take 1,000 mg by  mouth 2 (two) times daily as needed for moderate pain or headache.   albuterol 108 (90 Base) MCG/ACT inhaler Commonly known as:  PROVENTIL HFA;VENTOLIN HFA Inhale 2 puffs into the lungs every 6 (six) hours as needed for wheezing or shortness of breath.   apixaban 2.5 MG Tabs tablet Commonly known as:  ELIQUIS Take 1 tablet (2.5 mg total) by mouth 2 (two) times daily.   AURYXIA 1 GM 210 MG(Fe) tablet Generic drug:  ferric citrate Take 210-420 mg by mouth See admin instructions. Take 210-420 mg with each meal (based on size of meal) and 210 mg with each snack   calcitRIOL 0.5 MCG capsule Commonly known  as:  ROCALTROL Take 1 capsule (0.5 mcg total) by mouth every other day. What changed:  how much to take   ciprofloxacin-dexamethasone OTIC suspension Commonly known as:  [place] 4 drops into the right ear 2 (two) times daily. X 3 days   multivitamin Tabs tablet Take 1 tablet by mouth at bedtime.   mupirocin ointment 2 % Commonly known as:  BACTROBAN Apply 1 application topically daily as needed (skin bumps).   nystatin powder Generic drug:  nystatin Apply to affected area twice daily as needed for dryness   oxyCODONE 5 MG immediate release tablet Commonly known as:  ROXICODONE Take 1 tablet (5 mg total) by mouth every 6 (six) hours as needed for severe pain.   pramipexole 0.125 MG tablet Commonly known as:  MIRAPEX Take 1 tablet (0.125 mg total) by mouth 3 (three) times daily. What changed:  when to take this   promethazine 12.5 MG tablet Commonly known as:  PHENERGAN Take 1 tablet (12.5 mg total) by mouth every 6 (six) hours as needed for nausea or vomiting.       Prescriptions given: Roxicodone #6 No Refill  Instructions:   [facility]  Discharge Instructions  AV Fistula or Graft Surgery for Dialysis Access  Please refer to the following instructions for your post-procedure care. Your surgeon or physician assistant  will discuss any changes with you.  Activity  You may drive the day following your surgery, if you are comfortable and no longer taking prescription pain medication. Resume full activity as the soreness in your incision resolves.  Bathing/Showering  You may shower after you go home. Keep your incision dry for 48 hours. Do not soak in a bathtub, hot tub, or swim until the incision heals completely. You may not shower if you have a hemodialysis catheter.  Incision Care  Clean your incision with mild soap and water after 48 hours. Pat the area dry with a clean towel. You do not need a bandage unless otherwise instructed. Do not apply any ointments or creams to your incision. You may have skin glue on your incision. Do not peel it off. It will come off on its own in about one week. Your arm may swell a bit after surgery. To reduce swelling use pillows to elevate your arm so it is above your heart. Your doctor will tell you if you need to lightly wrap your arm with an ACE bandage.  Diet  Resume your normal diet. There are not special food restrictions following this procedure. In order to heal from your surgery, it is CRITICAL to get adequate nutrition. Your body requires vitamins, minerals, and protein. Vegetables are the best source of vitamins and minerals. Vegetables also provide the perfect balance of protein. Processed food has little nutritional value, so try to avoid this.  Medications  Resume taking all of your medications. If your incision is causing pain, you may take over-the counter pain relievers such as acetaminophen (Tylenol). If you were prescribed a stronger pain medication, please be aware these medications can cause nausea and constipation. Prevent nausea by taking the medication with a snack or meal. Avoid constipation by drinking plenty of fluids and eating foods with high amount of fiber, such as fruits, vegetables, and grains.  Do not take Tylenol if you are taking  prescription pain medications.  Follow up Your surgeon may want to see you in the office following your access surgery. If so, this will be arranged at the  time of your surgery.  Please call us immediately for any of the following conditions:  Increased pain, redness, drainage (pus) from your incision site Fever of 101 degrees or higher Severe or worsening pain at your incision site Hand pain or numbness.  Reduce your risk of vascular disease:  Stop smoking. If you would like help, call [facility] at 1-800-QUIT-NOW 530 507 6504) or [place] at [place] your cholesterol Maintain a desired weight Control your diabetes Keep your blood pressure down  Dialysis  It will take several weeks to several months for your new dialysis access to be ready for use. Your surgeon will determine when it is okay to use it. Your nephrologist will continue to direct your dialysis. You can continue to use your Permcath until your new access is ready for use.   07/17/2018 Lasean Rahming [id number] Oct 14, 1956  Surgeon(s): Waynetta Sandy, MD  Procedure(s): ARTERIOVENOUS FISTULA CREATION  x Do not stick fistula for 12 weeks    If you have any questions, please call the office at [phone].   Disposition: home  [name] condition: is Good  Follow up: 1. Dr. Donzetta Matters in 4-6 weeks   Leontine Locket, PA-C [facility] [phone] 07/17/2018  7:14 AM

## 2018-07-17 NOTE — Progress Notes (Signed)
  Progress Note    07/17/2018 8:17 AM 1 Day Post-Op  Subjective:   No overnight issues  Vitals:   07/16/18 2132 07/17/18 0340  BP: 136/68 134/64  Pulse:  95  Resp: 12 18  Temp: (!) 97.2 F (36.2 C) 99.4 F (37.4 C)  SpO2: 92% 94%    Physical Exam:  awake and alert Weak thrill left upper arm is palpable Hand is sensorimotor intact on the left   CBC    Component Value Date/Time   WBC 14.4 (H) 06/11/2018 1241   RBC 3.64 (L) 06/11/2018 1241   HGB 12.9 07/16/2018 1032   HGB 10.9 (L) 07/07/2017 1235   HCT 38.0 07/16/2018 1032   HCT 35.0 07/07/2017 1235   PLT 161 06/11/2018 1241   PLT 275 07/07/2017 1235   MCV 93.1 06/11/2018 1241   MCV 85.2 07/07/2017 1235   MCH 28.6 06/11/2018 1241   MCHC 30.7 (L) 06/11/2018 1241   RDW 17.0 (H) 06/11/2018 1241   RDW 15.8 (H) 07/07/2017 1235   LYMPHSABS 3.3 06/11/2018 1241   LYMPHSABS 2.4 07/07/2017 1235   MONOABS 0.7 06/11/2018 1241   MONOABS 0.8 07/07/2017 1235   EOSABS 0.2 06/11/2018 1241   EOSABS 0.2 07/07/2017 1235   BASOSABS 0.0 06/11/2018 1241   BASOSABS 0.0 07/07/2017 1235    BMET    Component Value Date/Time   NA 139 07/16/2018 1032   NA 138 07/07/2017 1235   K 4.6 07/16/2018 1032   K 4.5 07/07/2017 1235   CL 101 06/11/2018 1241   CL 111 (H) 12/04/2012 1056   CO2 25 06/11/2018 1241   CO2 17 (L) 07/07/2017 1235   GLUCOSE 99 07/16/2018 1032   GLUCOSE 85 07/07/2017 1235   GLUCOSE 97 12/04/2012 1056   BUN 33 (H) 06/11/2018 1241   BUN 40.8 (H) 07/07/2017 1235   CREATININE 6.05 (HH) 06/11/2018 1241   CREATININE 3.4 (HH) 07/07/2017 1235   CALCIUM 9.9 06/11/2018 1241   CALCIUM 9.5 07/07/2017 1235   GFRNONAA 7 (L) 06/11/2018 1241   GFRAA 8 (L) 06/11/2018 1241    INR    Component Value Date/Time   INR 0.98 07/16/2018 1042     Intake/Output Summary (Last 24 hours) at 07/17/2018 0817 Last data filed at 07/16/2018 2200 Gross per 24 hour  Intake 930 ml  Output -  Net 930 ml     Assessment:  62 y.o.  female is s/p left first stage basilic vein fistula with a weak thrill today and had very small artery and has a previous failed left brachiocephalic fistula on the left also likely due to poor inflow  Plan: Discharge today in time for dialysis We will follow-up 4 to 6 weeks with dialysis duplex If this fistula fails she will need consideration of either axillary loop graft or right arm access given the small nature of her left upper extremity arteries   Brandon C. Donzetta Matters, MD [facility] Office: [phone] Pager: [phone]  07/17/2018 8:17 AM

## 2018-07-17 NOTE — Transfer of Care (Signed)
Immediate Anesthesia Transfer of Care Note  Patient: Whitney Walker  Procedure(s) Performed: ARTERIOVENOUS FISTULA CREATION (Left Arm Upper)  Patient Location: PACU  Anesthesia Type:General  Level of Consciousness: awake, alert  and oriented  Airway & Oxygen Therapy: Patient Spontanous Breathing and Patient connected to nasal cannula oxygen  Post-op Assessment: Report given to RN and Post -op Vital signs reviewed and stable  Post vital signs: Reviewed and stable  Last Vitals:  Vitals Value Taken Time  BP    Temp    Pulse    Resp    SpO2      Last Pain:  Vitals:   07/17/18 0747  TempSrc:   PainSc: 0-No pain      Patients Stated Pain Goal: 0 (92/42/68 3419)  Complications: No apparent anesthesia complications

## 2018-07-17 NOTE — Telephone Encounter (Signed)
sch appt lvm 08/07/18 10am Dialysis Duplex 1115am p/o MD

## 2018-07-17 NOTE — Progress Notes (Signed)
Pt chart flagged to be seen by case mgt prior to discharge due to potential code 44.  Pt needs to leave ASAP for transportation issues and need for outpatient dialysis this morning.  Case mgt not in until 0800.  Vascular PA notified and she agreed that pt needs to be discharged now.  Ride is on the way.

## 2018-07-17 NOTE — Progress Notes (Signed)
Orders for discharge received.  Pt has HD outpatient scheduled today and transportation is scheduled to pick her up at her house at 1100.  She has called for her daughter to pick her up, and due to her daughter's work schedule and time constraints, the patient requests to wait in the lobby for her daughter to pick her up.  Patient assessment complete. VSS.  Discharge in progress and pt will be transferred off unit to wait for her daughter as per her request.

## 2018-07-17 NOTE — Plan of Care (Signed)
  Problem: Education: Goal: Knowledge of General Education information will improve Description: Including pain rating scale, medication(s)/side effects and non-pharmacologic comfort measures Outcome: Progressing   Problem: Health Behavior/Discharge Planning: Goal: Ability to manage health-related needs will improve Outcome: Progressing   Problem: Pain Managment: Goal: General experience of comfort will improve Outcome: Progressing   

## 2018-07-20 DIAGNOSIS — D509 Iron deficiency anemia, unspecified: Secondary | ICD-10-CM | POA: Diagnosis not present

## 2018-07-20 DIAGNOSIS — D631 Anemia in chronic kidney disease: Secondary | ICD-10-CM | POA: Diagnosis not present

## 2018-07-20 DIAGNOSIS — N2581 Secondary hyperparathyroidism of renal origin: Secondary | ICD-10-CM | POA: Diagnosis not present

## 2018-07-20 DIAGNOSIS — N186 End stage renal disease: Secondary | ICD-10-CM | POA: Diagnosis not present

## 2018-07-22 ENCOUNTER — Other Ambulatory Visit: Payer: Self-pay

## 2018-07-22 DIAGNOSIS — N186 End stage renal disease: Secondary | ICD-10-CM | POA: Diagnosis not present

## 2018-07-22 DIAGNOSIS — D509 Iron deficiency anemia, unspecified: Secondary | ICD-10-CM | POA: Diagnosis not present

## 2018-07-22 DIAGNOSIS — D631 Anemia in chronic kidney disease: Secondary | ICD-10-CM | POA: Diagnosis not present

## 2018-07-22 DIAGNOSIS — N2581 Secondary hyperparathyroidism of renal origin: Secondary | ICD-10-CM | POA: Diagnosis not present

## 2018-07-22 DIAGNOSIS — Z48812 Encounter for surgical aftercare following surgery on the circulatory system: Secondary | ICD-10-CM

## 2018-07-22 DIAGNOSIS — Z992 Dependence on renal dialysis: Principal | ICD-10-CM

## 2018-07-23 ENCOUNTER — Ambulatory Visit: Payer: Self-pay

## 2018-07-24 DIAGNOSIS — D631 Anemia in chronic kidney disease: Secondary | ICD-10-CM | POA: Diagnosis not present

## 2018-07-24 DIAGNOSIS — N2581 Secondary hyperparathyroidism of renal origin: Secondary | ICD-10-CM | POA: Diagnosis not present

## 2018-07-24 DIAGNOSIS — D509 Iron deficiency anemia, unspecified: Secondary | ICD-10-CM | POA: Diagnosis not present

## 2018-07-24 DIAGNOSIS — N186 End stage renal disease: Secondary | ICD-10-CM | POA: Diagnosis not present

## 2018-07-27 DIAGNOSIS — N2581 Secondary hyperparathyroidism of renal origin: Secondary | ICD-10-CM | POA: Diagnosis not present

## 2018-07-27 DIAGNOSIS — D631 Anemia in chronic kidney disease: Secondary | ICD-10-CM | POA: Diagnosis not present

## 2018-07-27 DIAGNOSIS — N186 End stage renal disease: Secondary | ICD-10-CM | POA: Diagnosis not present

## 2018-07-27 DIAGNOSIS — D509 Iron deficiency anemia, unspecified: Secondary | ICD-10-CM | POA: Diagnosis not present

## 2018-07-28 ENCOUNTER — Ambulatory Visit (INDEPENDENT_AMBULATORY_CARE_PROVIDER_SITE_OTHER): Payer: Medicare Other | Admitting: Psychiatry

## 2018-07-28 DIAGNOSIS — F333 Major depressive disorder, recurrent, severe with psychotic symptoms: Secondary | ICD-10-CM | POA: Diagnosis not present

## 2018-07-29 DIAGNOSIS — N186 End stage renal disease: Secondary | ICD-10-CM | POA: Diagnosis not present

## 2018-07-29 DIAGNOSIS — N2581 Secondary hyperparathyroidism of renal origin: Secondary | ICD-10-CM | POA: Diagnosis not present

## 2018-07-29 DIAGNOSIS — D509 Iron deficiency anemia, unspecified: Secondary | ICD-10-CM | POA: Diagnosis not present

## 2018-07-29 DIAGNOSIS — D631 Anemia in chronic kidney disease: Secondary | ICD-10-CM | POA: Diagnosis not present

## 2018-07-30 DIAGNOSIS — N186 End stage renal disease: Secondary | ICD-10-CM | POA: Diagnosis not present

## 2018-07-30 DIAGNOSIS — Z992 Dependence on renal dialysis: Secondary | ICD-10-CM | POA: Diagnosis not present

## 2018-07-30 DIAGNOSIS — I129 Hypertensive chronic kidney disease with stage 1 through stage 4 chronic kidney disease, or unspecified chronic kidney disease: Secondary | ICD-10-CM | POA: Diagnosis not present

## 2018-07-31 DIAGNOSIS — N2581 Secondary hyperparathyroidism of renal origin: Secondary | ICD-10-CM | POA: Diagnosis not present

## 2018-07-31 DIAGNOSIS — N186 End stage renal disease: Secondary | ICD-10-CM | POA: Diagnosis not present

## 2018-07-31 DIAGNOSIS — E875 Hyperkalemia: Secondary | ICD-10-CM | POA: Diagnosis not present

## 2018-07-31 DIAGNOSIS — D509 Iron deficiency anemia, unspecified: Secondary | ICD-10-CM | POA: Diagnosis not present

## 2018-08-03 DIAGNOSIS — N186 End stage renal disease: Secondary | ICD-10-CM | POA: Diagnosis not present

## 2018-08-03 DIAGNOSIS — E875 Hyperkalemia: Secondary | ICD-10-CM | POA: Diagnosis not present

## 2018-08-03 DIAGNOSIS — N2581 Secondary hyperparathyroidism of renal origin: Secondary | ICD-10-CM | POA: Diagnosis not present

## 2018-08-03 DIAGNOSIS — D509 Iron deficiency anemia, unspecified: Secondary | ICD-10-CM | POA: Diagnosis not present

## 2018-08-05 DIAGNOSIS — N186 End stage renal disease: Secondary | ICD-10-CM | POA: Diagnosis not present

## 2018-08-05 DIAGNOSIS — N2581 Secondary hyperparathyroidism of renal origin: Secondary | ICD-10-CM | POA: Diagnosis not present

## 2018-08-05 DIAGNOSIS — D509 Iron deficiency anemia, unspecified: Secondary | ICD-10-CM | POA: Diagnosis not present

## 2018-08-05 DIAGNOSIS — E875 Hyperkalemia: Secondary | ICD-10-CM | POA: Diagnosis not present

## 2018-08-07 ENCOUNTER — Other Ambulatory Visit: Payer: Self-pay

## 2018-08-07 ENCOUNTER — Encounter: Payer: Self-pay | Admitting: Vascular Surgery

## 2018-08-07 ENCOUNTER — Encounter: Payer: Self-pay | Admitting: *Deleted

## 2018-08-07 ENCOUNTER — Ambulatory Visit (HOSPITAL_COMMUNITY)
Admission: RE | Admit: 2018-08-07 | Discharge: 2018-08-07 | Disposition: A | Discharge status: Home / Self Care | Payer: Medicare Other | Source: Ambulatory Visit | Attending: Vascular Surgery | Admitting: Vascular Surgery

## 2018-08-07 ENCOUNTER — Ambulatory Visit (INDEPENDENT_AMBULATORY_CARE_PROVIDER_SITE_OTHER): Payer: Self-pay | Admitting: Vascular Surgery

## 2018-08-07 VITALS — BP 146/82 | HR 82 | Temp 98.4°F | Resp 16 | Ht 63.0 in | Wt 199.0 lb

## 2018-08-07 DIAGNOSIS — E875 Hyperkalemia: Secondary | ICD-10-CM | POA: Diagnosis not present

## 2018-08-07 DIAGNOSIS — Z992 Dependence on renal dialysis: Secondary | ICD-10-CM

## 2018-08-07 DIAGNOSIS — N186 End stage renal disease: Secondary | ICD-10-CM | POA: Insufficient documentation

## 2018-08-07 DIAGNOSIS — Z48812 Encounter for surgical aftercare following surgery on the circulatory system: Secondary | ICD-10-CM | POA: Diagnosis not present

## 2018-08-07 DIAGNOSIS — D509 Iron deficiency anemia, unspecified: Secondary | ICD-10-CM | POA: Diagnosis not present

## 2018-08-07 DIAGNOSIS — N2581 Secondary hyperparathyroidism of renal origin: Secondary | ICD-10-CM | POA: Diagnosis not present

## 2018-08-07 NOTE — Progress Notes (Signed)
  Subjective:     Patient ID: Whitney Walker, female   DOB: July 12, 1956, 62 y.o.   MRN: [id number]  HPI 62 year old female follows up after left first stage basilic vein fistula.  She had a previous failed cephalic vein fistula on the same side.  She does not have any swelling she does not have any pain in her left arm she does have some numbness of the forearm.   Review of Systems Left arm numbness        Objective:   Physical Exam awake alert oriented Nonlabored respirations Palpable radial ulnar pulses on the left Very strong thrill left upper extremity on the medial aspect    I have independently interpreted her dialysis access which demonstrates flow volume of 357 with a diameter up to 0.46 cm    Assessment/plan     62 year old female follows up after creation of a first stage basilic vein fistula.  This was just done 3 weeks ago and so we will wait a few more weeks prior to proceeding with second stage but does have a very strong thrill.  She will continue dialysis on Monday Wednesday Friday via her catheter.  We will hold Eliquis 48 hours prior to procedure.  Next  [employer]. Donzetta Matters, MD [facility] Office: [phone] Pager: [phone]

## 2018-08-10 ENCOUNTER — Other Ambulatory Visit: Payer: Self-pay | Admitting: *Deleted

## 2018-08-10 DIAGNOSIS — D509 Iron deficiency anemia, unspecified: Secondary | ICD-10-CM | POA: Diagnosis not present

## 2018-08-10 DIAGNOSIS — N2581 Secondary hyperparathyroidism of renal origin: Secondary | ICD-10-CM | POA: Diagnosis not present

## 2018-08-10 DIAGNOSIS — N186 End stage renal disease: Secondary | ICD-10-CM | POA: Diagnosis not present

## 2018-08-10 DIAGNOSIS — E875 Hyperkalemia: Secondary | ICD-10-CM | POA: Diagnosis not present

## 2018-08-11 ENCOUNTER — Ambulatory Visit
Admission: RE | Admit: 2018-08-11 | Discharge: 2018-08-11 | Disposition: A | Discharge status: Home / Self Care | Payer: Medicare Other | Source: Ambulatory Visit | Attending: Hematology | Admitting: Hematology

## 2018-08-11 DIAGNOSIS — Z1231 Encounter for screening mammogram for malignant neoplasm of breast: Secondary | ICD-10-CM | POA: Diagnosis not present

## 2018-08-11 IMAGING — MG DIGITAL SCREENING BILATERAL MAMMOGRAM WITH TOMO AND CAD
6 of 12 series · 6 of 36 positions shown · non-contrast
Comparison: Previous exam(s).

CLINICAL DATA: Screening.

EXAM:
DIGITAL SCREENING BILATERAL MAMMOGRAM WITH TOMO AND CAD

[L CC synth-2D (1 of 2)]
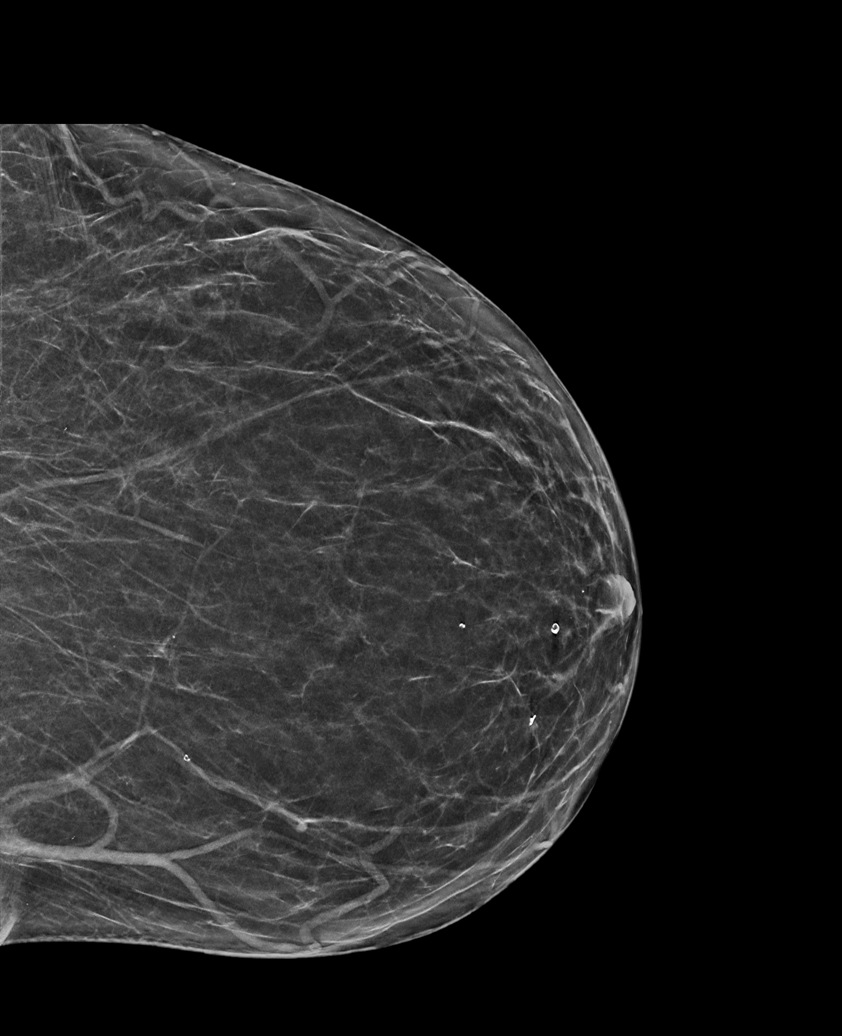

[R CC synth-2D (1 of 2)]
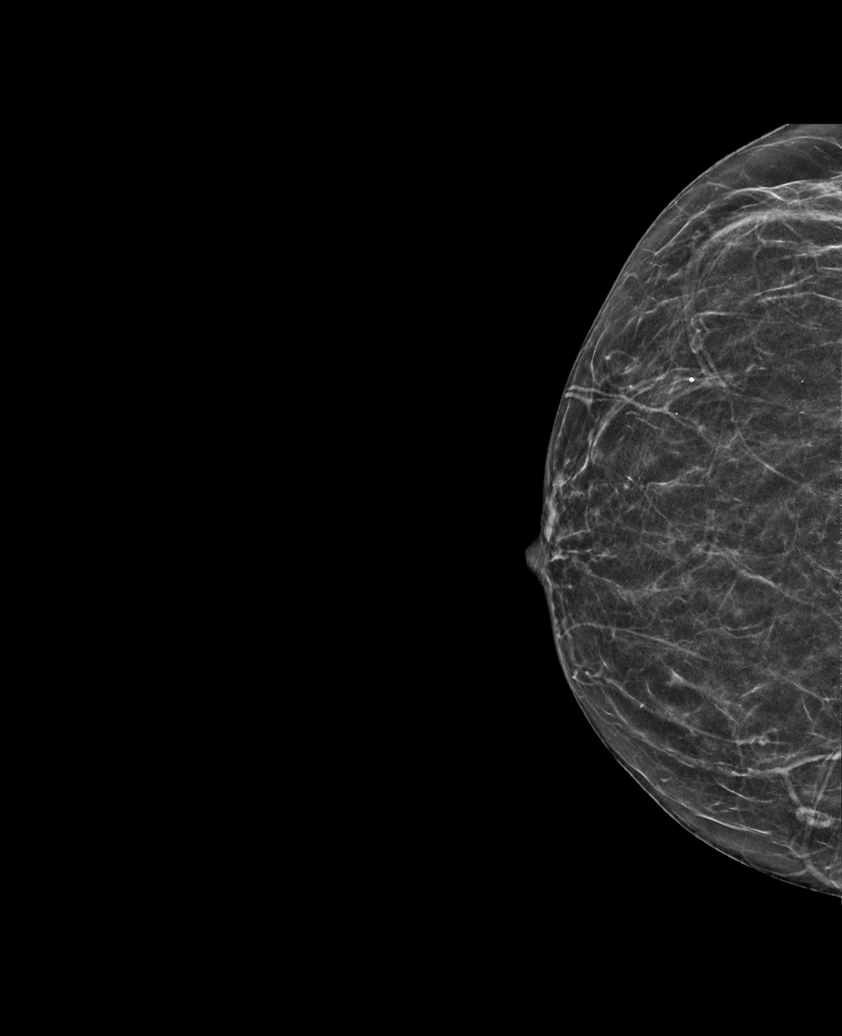

[L MLO synth-2D]
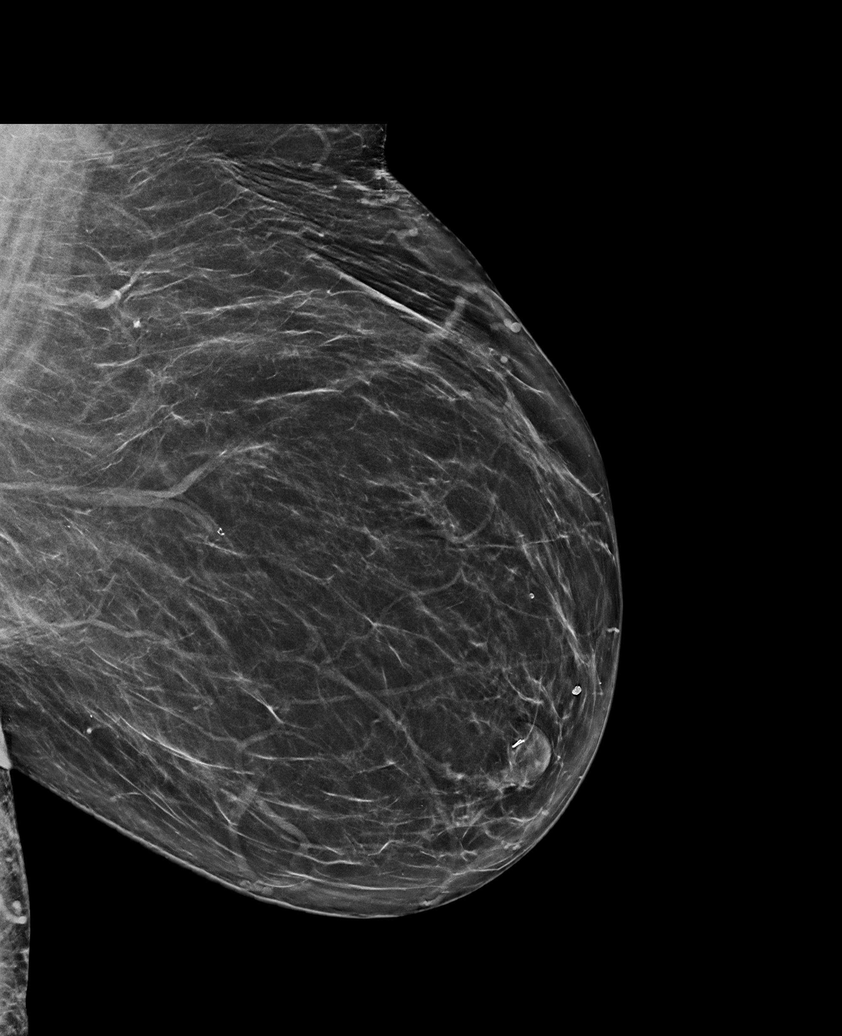

[R MLO synth-2D]
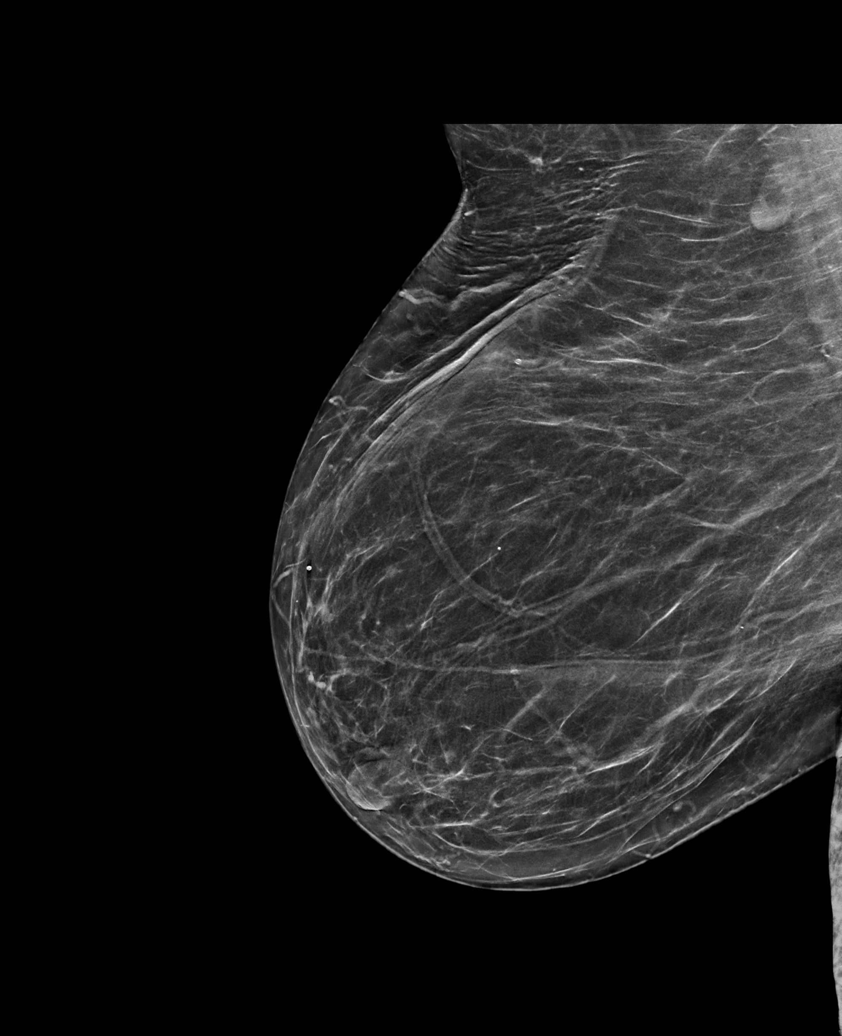

[L CC synth-2D (2 of 2)]
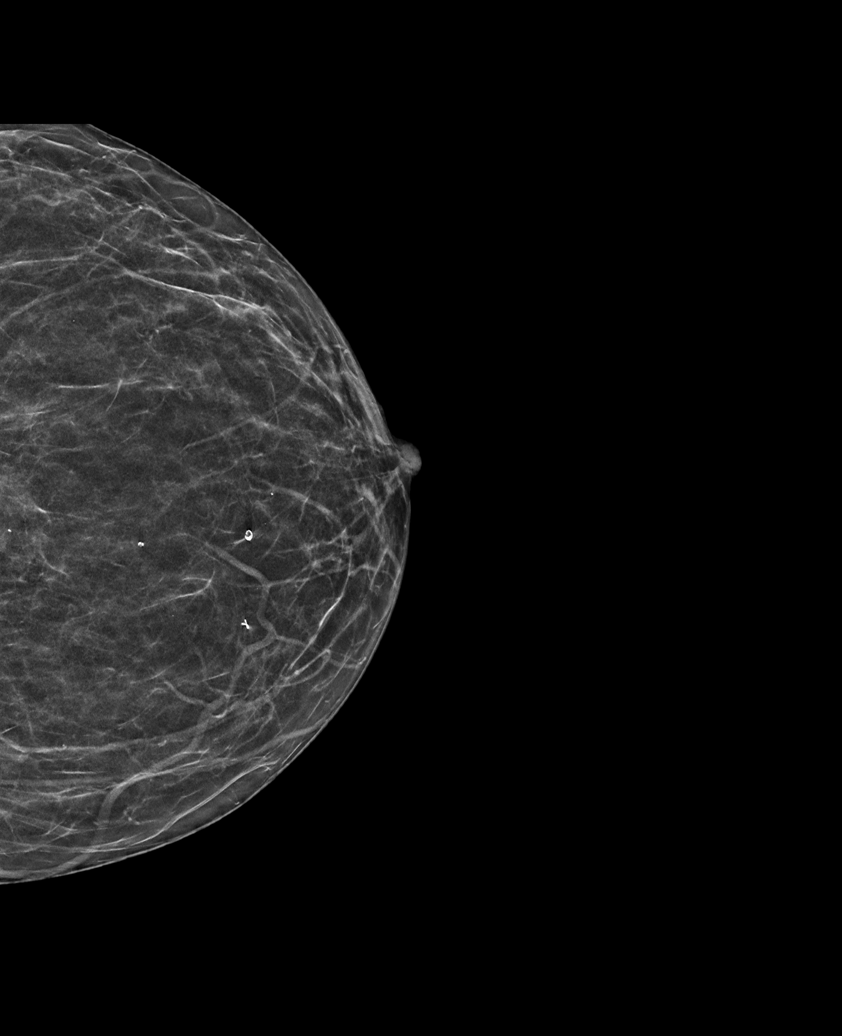

[R CC synth-2D (2 of 2)]
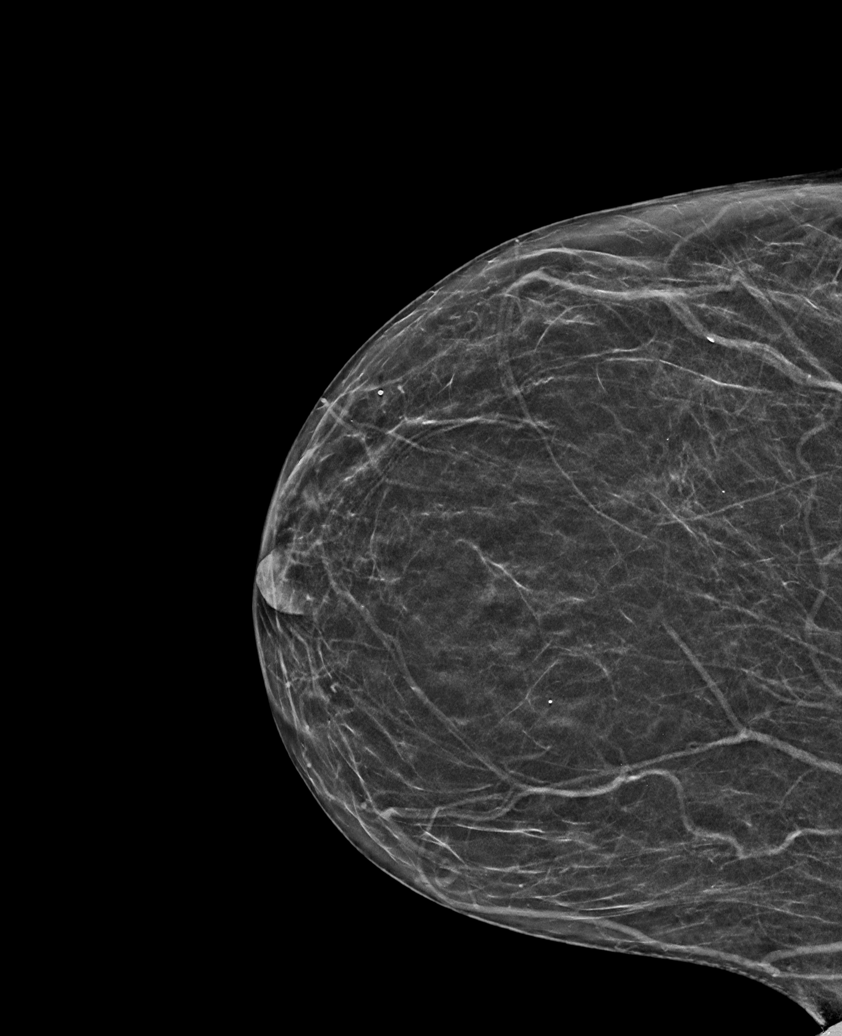

[6 of 36 positions shown; findings below may reference images not displayed]

ACR Breast Density Category b: There are scattered areas of
fibroglandular density.
FINDINGS: There are no findings suspicious for malignancy. Images were
processed with CAD.
IMPRESSION: No mammographic evidence of malignancy. A result letter of this
screening mammogram will be mailed directly to the patient.

RECOMMENDATION:
Screening mammogram in one year. (Code:[TQ])

BI-RADS CATEGORY  1: Negative.

## 2018-08-12 DIAGNOSIS — N186 End stage renal disease: Secondary | ICD-10-CM | POA: Diagnosis not present

## 2018-08-12 DIAGNOSIS — N2581 Secondary hyperparathyroidism of renal origin: Secondary | ICD-10-CM | POA: Diagnosis not present

## 2018-08-12 DIAGNOSIS — D509 Iron deficiency anemia, unspecified: Secondary | ICD-10-CM | POA: Diagnosis not present

## 2018-08-12 DIAGNOSIS — E875 Hyperkalemia: Secondary | ICD-10-CM | POA: Diagnosis not present

## 2018-08-14 DIAGNOSIS — D509 Iron deficiency anemia, unspecified: Secondary | ICD-10-CM | POA: Diagnosis not present

## 2018-08-14 DIAGNOSIS — E875 Hyperkalemia: Secondary | ICD-10-CM | POA: Diagnosis not present

## 2018-08-14 DIAGNOSIS — N186 End stage renal disease: Secondary | ICD-10-CM | POA: Diagnosis not present

## 2018-08-14 DIAGNOSIS — N2581 Secondary hyperparathyroidism of renal origin: Secondary | ICD-10-CM | POA: Diagnosis not present

## 2018-08-17 DIAGNOSIS — N186 End stage renal disease: Secondary | ICD-10-CM | POA: Diagnosis not present

## 2018-08-17 DIAGNOSIS — D509 Iron deficiency anemia, unspecified: Secondary | ICD-10-CM | POA: Diagnosis not present

## 2018-08-17 DIAGNOSIS — E875 Hyperkalemia: Secondary | ICD-10-CM | POA: Diagnosis not present

## 2018-08-17 DIAGNOSIS — N2581 Secondary hyperparathyroidism of renal origin: Secondary | ICD-10-CM | POA: Diagnosis not present

## 2018-08-18 ENCOUNTER — Ambulatory Visit (INDEPENDENT_AMBULATORY_CARE_PROVIDER_SITE_OTHER): Payer: Medicare Other | Admitting: Psychiatry

## 2018-08-18 DIAGNOSIS — F333 Major depressive disorder, recurrent, severe with psychotic symptoms: Secondary | ICD-10-CM

## 2018-08-19 DIAGNOSIS — D509 Iron deficiency anemia, unspecified: Secondary | ICD-10-CM | POA: Diagnosis not present

## 2018-08-19 DIAGNOSIS — N2581 Secondary hyperparathyroidism of renal origin: Secondary | ICD-10-CM | POA: Diagnosis not present

## 2018-08-19 DIAGNOSIS — E875 Hyperkalemia: Secondary | ICD-10-CM | POA: Diagnosis not present

## 2018-08-19 DIAGNOSIS — N186 End stage renal disease: Secondary | ICD-10-CM | POA: Diagnosis not present

## 2018-08-21 DIAGNOSIS — D509 Iron deficiency anemia, unspecified: Secondary | ICD-10-CM | POA: Diagnosis not present

## 2018-08-21 DIAGNOSIS — N2581 Secondary hyperparathyroidism of renal origin: Secondary | ICD-10-CM | POA: Diagnosis not present

## 2018-08-21 DIAGNOSIS — E875 Hyperkalemia: Secondary | ICD-10-CM | POA: Diagnosis not present

## 2018-08-21 DIAGNOSIS — N186 End stage renal disease: Secondary | ICD-10-CM | POA: Diagnosis not present

## 2018-08-24 DIAGNOSIS — N186 End stage renal disease: Secondary | ICD-10-CM | POA: Diagnosis not present

## 2018-08-24 DIAGNOSIS — N2581 Secondary hyperparathyroidism of renal origin: Secondary | ICD-10-CM | POA: Diagnosis not present

## 2018-08-24 DIAGNOSIS — D509 Iron deficiency anemia, unspecified: Secondary | ICD-10-CM | POA: Diagnosis not present

## 2018-08-24 DIAGNOSIS — E875 Hyperkalemia: Secondary | ICD-10-CM | POA: Diagnosis not present

## 2018-08-26 DIAGNOSIS — N2581 Secondary hyperparathyroidism of renal origin: Secondary | ICD-10-CM | POA: Diagnosis not present

## 2018-08-26 DIAGNOSIS — N186 End stage renal disease: Secondary | ICD-10-CM | POA: Diagnosis not present

## 2018-08-26 DIAGNOSIS — E875 Hyperkalemia: Secondary | ICD-10-CM | POA: Diagnosis not present

## 2018-08-26 DIAGNOSIS — D509 Iron deficiency anemia, unspecified: Secondary | ICD-10-CM | POA: Diagnosis not present

## 2018-08-27 DIAGNOSIS — G2581 Restless legs syndrome: Secondary | ICD-10-CM | POA: Diagnosis not present

## 2018-08-27 DIAGNOSIS — R42 Dizziness and giddiness: Secondary | ICD-10-CM | POA: Diagnosis not present

## 2018-08-28 DIAGNOSIS — N2581 Secondary hyperparathyroidism of renal origin: Secondary | ICD-10-CM | POA: Diagnosis not present

## 2018-08-28 DIAGNOSIS — D509 Iron deficiency anemia, unspecified: Secondary | ICD-10-CM | POA: Diagnosis not present

## 2018-08-28 DIAGNOSIS — N186 End stage renal disease: Secondary | ICD-10-CM | POA: Diagnosis not present

## 2018-08-28 DIAGNOSIS — E875 Hyperkalemia: Secondary | ICD-10-CM | POA: Diagnosis not present

## 2018-08-31 DIAGNOSIS — D631 Anemia in chronic kidney disease: Secondary | ICD-10-CM | POA: Diagnosis not present

## 2018-08-31 DIAGNOSIS — Z23 Encounter for immunization: Secondary | ICD-10-CM | POA: Diagnosis not present

## 2018-08-31 DIAGNOSIS — N2581 Secondary hyperparathyroidism of renal origin: Secondary | ICD-10-CM | POA: Diagnosis not present

## 2018-08-31 DIAGNOSIS — D509 Iron deficiency anemia, unspecified: Secondary | ICD-10-CM | POA: Diagnosis not present

## 2018-08-31 DIAGNOSIS — N186 End stage renal disease: Secondary | ICD-10-CM | POA: Diagnosis not present

## 2018-09-01 ENCOUNTER — Other Ambulatory Visit: Payer: Self-pay

## 2018-09-01 ENCOUNTER — Encounter (HOSPITAL_COMMUNITY): Payer: Self-pay | Admitting: *Deleted

## 2018-09-01 NOTE — Progress Notes (Signed)
Whitney  Walker denies chest pain, no shortness of breath at rest.  Patient reports that she was seen 08/27/18 at PCP's Dr [name], she saw J. Laurance Flatten, NP.  Patient was seen for vertigo, Whitney Walker reports that she has had vertigo in the past but it has started again.    Patient said that they are not sure what is causing it.  Whitney Walker has sleep apnea, she has a CPAP but does not use it on a regular basis.  I talked with patient about how important it is to use, especially after surgery, patient replied that she understands.

## 2018-09-02 DIAGNOSIS — N2581 Secondary hyperparathyroidism of renal origin: Secondary | ICD-10-CM | POA: Diagnosis not present

## 2018-09-02 DIAGNOSIS — Z23 Encounter for immunization: Secondary | ICD-10-CM | POA: Diagnosis not present

## 2018-09-02 DIAGNOSIS — D509 Iron deficiency anemia, unspecified: Secondary | ICD-10-CM | POA: Diagnosis not present

## 2018-09-02 DIAGNOSIS — N186 End stage renal disease: Secondary | ICD-10-CM | POA: Diagnosis not present

## 2018-09-02 DIAGNOSIS — D631 Anemia in chronic kidney disease: Secondary | ICD-10-CM | POA: Diagnosis not present

## 2018-09-03 ENCOUNTER — Encounter (HOSPITAL_COMMUNITY)
Admission: RE | Disposition: A | Discharge status: Home / Self Care | Payer: Self-pay | Source: Ambulatory Visit | Attending: Vascular Surgery

## 2018-09-03 ENCOUNTER — Ambulatory Visit (HOSPITAL_COMMUNITY): Payer: Medicare Other | Admitting: Certified Registered Nurse Anesthetist

## 2018-09-03 ENCOUNTER — Encounter (HOSPITAL_COMMUNITY): Payer: Self-pay | Admitting: *Deleted

## 2018-09-03 ENCOUNTER — Ambulatory Visit (HOSPITAL_COMMUNITY)
Admission: RE | Admit: 2018-09-03 | Discharge: 2018-09-03 | Disposition: A | Discharge status: Home / Self Care | Payer: Medicare Other | Source: Ambulatory Visit | Attending: Vascular Surgery | Admitting: Vascular Surgery

## 2018-09-03 DIAGNOSIS — Z79899 Other long term (current) drug therapy: Secondary | ICD-10-CM | POA: Insufficient documentation

## 2018-09-03 DIAGNOSIS — Z992 Dependence on renal dialysis: Secondary | ICD-10-CM | POA: Diagnosis not present

## 2018-09-03 DIAGNOSIS — I509 Heart failure, unspecified: Secondary | ICD-10-CM | POA: Diagnosis not present

## 2018-09-03 DIAGNOSIS — Z803 Family history of malignant neoplasm of breast: Secondary | ICD-10-CM | POA: Diagnosis not present

## 2018-09-03 DIAGNOSIS — J449 Chronic obstructive pulmonary disease, unspecified: Secondary | ICD-10-CM | POA: Insufficient documentation

## 2018-09-03 DIAGNOSIS — Z91041 Radiographic dye allergy status: Secondary | ICD-10-CM | POA: Diagnosis not present

## 2018-09-03 DIAGNOSIS — Z90710 Acquired absence of both cervix and uterus: Secondary | ICD-10-CM | POA: Diagnosis not present

## 2018-09-03 DIAGNOSIS — Z9049 Acquired absence of other specified parts of digestive tract: Secondary | ICD-10-CM | POA: Diagnosis not present

## 2018-09-03 DIAGNOSIS — Z85038 Personal history of other malignant neoplasm of large intestine: Secondary | ICD-10-CM | POA: Insufficient documentation

## 2018-09-03 DIAGNOSIS — Z7901 Long term (current) use of anticoagulants: Secondary | ICD-10-CM | POA: Insufficient documentation

## 2018-09-03 DIAGNOSIS — G4730 Sleep apnea, unspecified: Secondary | ICD-10-CM | POA: Insufficient documentation

## 2018-09-03 DIAGNOSIS — F17210 Nicotine dependence, cigarettes, uncomplicated: Secondary | ICD-10-CM | POA: Insufficient documentation

## 2018-09-03 DIAGNOSIS — N185 Chronic kidney disease, stage 5: Secondary | ICD-10-CM | POA: Diagnosis not present

## 2018-09-03 DIAGNOSIS — Z8042 Family history of malignant neoplasm of prostate: Secondary | ICD-10-CM | POA: Diagnosis not present

## 2018-09-03 DIAGNOSIS — I2510 Atherosclerotic heart disease of native coronary artery without angina pectoris: Secondary | ICD-10-CM | POA: Diagnosis not present

## 2018-09-03 DIAGNOSIS — Z951 Presence of aortocoronary bypass graft: Secondary | ICD-10-CM | POA: Insufficient documentation

## 2018-09-03 DIAGNOSIS — Z86718 Personal history of other venous thrombosis and embolism: Secondary | ICD-10-CM | POA: Diagnosis not present

## 2018-09-03 DIAGNOSIS — N186 End stage renal disease: Secondary | ICD-10-CM | POA: Insufficient documentation

## 2018-09-03 DIAGNOSIS — Z888 Allergy status to other drugs, medicaments and biological substances status: Secondary | ICD-10-CM | POA: Insufficient documentation

## 2018-09-03 DIAGNOSIS — Z955 Presence of coronary angioplasty implant and graft: Secondary | ICD-10-CM | POA: Insufficient documentation

## 2018-09-03 DIAGNOSIS — G2581 Restless legs syndrome: Secondary | ICD-10-CM | POA: Diagnosis not present

## 2018-09-03 DIAGNOSIS — I5032 Chronic diastolic (congestive) heart failure: Secondary | ICD-10-CM | POA: Diagnosis not present

## 2018-09-03 DIAGNOSIS — Z9889 Other specified postprocedural states: Secondary | ICD-10-CM | POA: Insufficient documentation

## 2018-09-03 DIAGNOSIS — Z8249 Family history of ischemic heart disease and other diseases of the circulatory system: Secondary | ICD-10-CM | POA: Insufficient documentation

## 2018-09-03 DIAGNOSIS — I132 Hypertensive heart and chronic kidney disease with heart failure and with stage 5 chronic kidney disease, or end stage renal disease: Secondary | ICD-10-CM | POA: Diagnosis not present

## 2018-09-03 DIAGNOSIS — Z86711 Personal history of pulmonary embolism: Secondary | ICD-10-CM | POA: Diagnosis not present

## 2018-09-03 HISTORY — DX: End stage renal disease: N18.6

## 2018-09-03 HISTORY — PX: BASCILIC VEIN TRANSPOSITION: SHX5742

## 2018-09-03 HISTORY — DX: Acute embolism and thrombosis of unspecified deep veins of unspecified lower extremity: I82.409

## 2018-09-03 LAB — POCT I-STAT 4, (NA,K, GLUC, HGB,HCT)
Glucose, Bld: 100 mg/dL — ABNORMAL HIGH (ref 70–99)
HCT: 44 % (ref 36.0–46.0)
Hemoglobin: 15 g/dL (ref 12.0–15.0)
Potassium: 5.2 mmol/L — ABNORMAL HIGH (ref 3.5–5.1)
Sodium: 137 mmol/L (ref 135–145)

## 2018-09-03 SURGERY — TRANSPOSITION, VEIN, BASILIC
Anesthesia: Monitor Anesthesia Care | Site: Arm Upper | Laterality: Left

## 2018-09-03 MED ORDER — SODIUM CHLORIDE 0.9 % IV SOLN
INTRAVENOUS | Status: DC
  Administered 2018-09-03: 25 mL/h via INTRAVENOUS

## 2018-09-03 MED ORDER — FENTANYL CITRATE (PF) 100 MCG/2ML IJ SOLN
25.0000 ug | INTRAMUSCULAR | Status: DC | PRN
  Administered 2018-09-03: 25 ug via INTRAVENOUS

## 2018-09-03 MED ORDER — LIDOCAINE HCL (CARDIAC) PF 100 MG/5ML IV SOSY
PREFILLED_SYRINGE | INTRAVENOUS | Status: DC | PRN
  Administered 2018-09-03: 80 mg via INTRAVENOUS

## 2018-09-03 MED ORDER — FENTANYL CITRATE (PF) 250 MCG/5ML IJ SOLN
INTRAMUSCULAR | Status: AC
  Filled 2018-09-03: qty 5

## 2018-09-03 MED ORDER — CEFAZOLIN SODIUM-DEXTROSE 2-4 GM/100ML-% IV SOLN
2.0000 g | INTRAVENOUS | Status: AC
  Administered 2018-09-03: 2 g via INTRAVENOUS
  Filled 2018-09-03: qty 100

## 2018-09-03 MED ORDER — OXYCODONE-ACETAMINOPHEN 5-325 MG PO TABS
1.0000 | ORAL_TABLET | ORAL | Status: AC | PRN
  Administered 2018-09-03: 1 via ORAL

## 2018-09-03 MED ORDER — 0.9 % SODIUM CHLORIDE (POUR BTL) OPTIME
TOPICAL | Status: DC | PRN
  Administered 2018-09-03: 1000 mL

## 2018-09-03 MED ORDER — FENTANYL CITRATE (PF) 100 MCG/2ML IJ SOLN
INTRAMUSCULAR | Status: DC | PRN
  Administered 2018-09-03: 50 ug via INTRAVENOUS
  Administered 2018-09-03 (×2): 25 ug via INTRAVENOUS
  Administered 2018-09-03: 50 ug via INTRAVENOUS
  Administered 2018-09-03 (×2): 25 ug via INTRAVENOUS

## 2018-09-03 MED ORDER — PROPOFOL 10 MG/ML IV BOLUS
INTRAVENOUS | Status: DC | PRN
  Administered 2018-09-03: 130 mg via INTRAVENOUS

## 2018-09-03 MED ORDER — ONDANSETRON HCL 4 MG/2ML IJ SOLN
INTRAMUSCULAR | Status: AC
  Filled 2018-09-03: qty 2

## 2018-09-03 MED ORDER — PHENYLEPHRINE HCL 10 MG/ML IJ SOLN
INTRAMUSCULAR | Status: DC | PRN
  Administered 2018-09-03: 120 ug via INTRAVENOUS
  Administered 2018-09-03 (×3): 80 ug via INTRAVENOUS
  Administered 2018-09-03: 120 ug via INTRAVENOUS

## 2018-09-03 MED ORDER — PHENYLEPHRINE 40 MCG/ML (10ML) SYRINGE FOR IV PUSH (FOR BLOOD PRESSURE SUPPORT)
PREFILLED_SYRINGE | INTRAVENOUS | Status: AC
  Filled 2018-09-03: qty 10

## 2018-09-03 MED ORDER — FENTANYL CITRATE (PF) 100 MCG/2ML IJ SOLN
INTRAMUSCULAR | Status: AC
  Filled 2018-09-03: qty 2

## 2018-09-03 MED ORDER — OXYCODONE-ACETAMINOPHEN 5-325 MG PO TABS
ORAL_TABLET | ORAL | Status: AC
  Filled 2018-09-03: qty 1

## 2018-09-03 MED ORDER — SODIUM CHLORIDE 0.9 % IV SOLN
INTRAVENOUS | Status: DC | PRN
  Administered 2018-09-03: 500 mL

## 2018-09-03 MED ORDER — ONDANSETRON HCL 4 MG/2ML IJ SOLN
INTRAMUSCULAR | Status: DC | PRN
  Administered 2018-09-03: 4 mg via INTRAVENOUS

## 2018-09-03 MED ORDER — SODIUM CHLORIDE 0.9 % IV SOLN
INTRAVENOUS | Status: AC
  Filled 2018-09-03: qty 1.2

## 2018-09-03 MED ORDER — LIDOCAINE HCL (PF) 1 % IJ SOLN
INTRAMUSCULAR | Status: AC
  Filled 2018-09-03: qty 30

## 2018-09-03 MED ORDER — OXYCODONE-ACETAMINOPHEN 5-325 MG PO TABS: 1.0000 | ORAL_TABLET | Freq: Four times a day (QID) | ORAL | 0 refills | Status: DC | PRN

## 2018-09-03 MED ORDER — SODIUM CHLORIDE 0.9 % IV SOLN
INTRAVENOUS | Status: DC | PRN
  Administered 2018-09-03: 20 ug/min via INTRAVENOUS

## 2018-09-03 MED ORDER — LIDOCAINE-EPINEPHRINE (PF) 1 %-1:200000 IJ SOLN
INTRAMUSCULAR | Status: AC
  Filled 2018-09-03: qty 30

## 2018-09-03 SURGICAL SUPPLY — 29 items
ARMBAND PINK RESTRICT EXTREMIT (MISCELLANEOUS) ×3 IMPLANT
CANISTER SUCT 3000ML PPV (MISCELLANEOUS) ×3 IMPLANT
CLIP VESOCCLUDE MED 24/CT (CLIP) IMPLANT
CLIP VESOCCLUDE MED 6/CT (CLIP) ×6 IMPLANT
CLIP VESOCCLUDE SM WIDE 24/CT (CLIP) IMPLANT
CLIP VESOCCLUDE SM WIDE 6/CT (CLIP) ×3 IMPLANT
COVER PROBE W GEL 5X96 (DRAPES) ×3 IMPLANT
DERMABOND ADVANCED (GAUZE/BANDAGES/DRESSINGS) ×2
DERMABOND ADVANCED .7 DNX12 (GAUZE/BANDAGES/DRESSINGS) ×1 IMPLANT
ELECT REM PT RETURN 9FT ADLT (ELECTROSURGICAL) ×3
ELECTRODE REM PT RTRN 9FT ADLT (ELECTROSURGICAL) ×1 IMPLANT
GLOVE BIO SURGEON STRL SZ7.5 (GLOVE) ×3 IMPLANT
GOWN STRL REUS W/ TWL LRG LVL3 (GOWN DISPOSABLE) ×2 IMPLANT
GOWN STRL REUS W/ TWL XL LVL3 (GOWN DISPOSABLE) ×1 IMPLANT
GOWN STRL REUS W/TWL LRG LVL3 (GOWN DISPOSABLE) ×4
GOWN STRL REUS W/TWL XL LVL3 (GOWN DISPOSABLE) ×2
KIT BASIN OR (CUSTOM PROCEDURE TRAY) ×3 IMPLANT
KIT TURNOVER KIT B (KITS) ×3 IMPLANT
NS IRRIG 1000ML POUR BTL (IV SOLUTION) ×3 IMPLANT
PACK CV ACCESS (CUSTOM PROCEDURE TRAY) ×3 IMPLANT
PAD ARMBOARD 7.5X6 YLW CONV (MISCELLANEOUS) ×6 IMPLANT
SUT MNCRL AB 4-0 PS2 18 (SUTURE) ×6 IMPLANT
SUT PROLENE 6 0 BV (SUTURE) ×3 IMPLANT
SUT SILK 2 0 SH (SUTURE) IMPLANT
SUT VIC AB 3-0 SH 27 (SUTURE) ×4
SUT VIC AB 3-0 SH 27X BRD (SUTURE) ×2 IMPLANT
TOWEL GREEN STERILE (TOWEL DISPOSABLE) ×3 IMPLANT
UNDERPAD 30X30 (UNDERPADS AND DIAPERS) ×3 IMPLANT
WATER STERILE IRR 1000ML POUR (IV SOLUTION) ×3 IMPLANT

## 2018-09-03 NOTE — Anesthesia Procedure Notes (Signed)
Procedure Name: LMA Insertion Date/Time: 09/03/2018 12:15 PM Performed by: Inda Coke, CRNA Pre-anesthesia Checklist: Patient identified, Emergency Drugs available, Suction available and Patient being monitored Patient Re-evaluated:Patient Re-evaluated prior to induction Oxygen Delivery Method: Circle System Utilized Preoxygenation: Pre-oxygenation with 100% oxygen Induction Type: IV induction Ventilation: Mask ventilation without difficulty LMA: LMA inserted LMA Size: 4.0 Number of attempts: 1 Airway Equipment and Method: Bite block Placement Confirmation: positive ETCO2 Tube secured with: Tape Dental Injury: Teeth and Oropharynx as per pre-operative assessment

## 2018-09-03 NOTE — Anesthesia Preprocedure Evaluation (Addendum)
Anesthesia Evaluation  Patient identified by MRN, date of birth, ID band Patient awake    Reviewed: Allergy & Precautions, [name] Chart, lab work & pertinent test results  History of Anesthesia Complications Negative for: history of anesthetic complications  Airway Mallampati: II  TM Distance: >3 FB     Dental   Pulmonary sleep apnea , COPD, Current Smoker, PE   breath sounds clear to auscultation       Cardiovascular hypertension, + CAD, + Peripheral Vascular Disease, +CHF and + DVT   Rhythm:Regular Rate:Normal   Moderate pulmonary HTN on TTE 2014    Neuro/Psych PSYCHIATRIC DISORDERS Anxiety Depression  Vertigo RLS     GI/Hepatic negative GI ROS, Neg liver ROS,  Colon cancer s/p colectomy    Endo/Other   Obesity   Renal/GU ESRF and DialysisRenal disease  negative genitourinary   Musculoskeletal negative musculoskeletal ROS (+)   Abdominal   Peds  Hematology negative hematology ROS (+)   Anesthesia Other Findings   Reproductive/Obstetrics                           Anesthesia Physical Anesthesia Plan  ASA: III  Anesthesia Plan: MAC   Post-op Pain Management:    Induction: Intravenous  PONV Risk Score and Plan: 2 and Propofol infusion and Treatment may vary due to age or medical condition  Airway Management Planned: Simple Face Mask and Natural Airway  Additional Equipment: None  Intra-op Plan:   Post-operative Plan:   Informed Consent: I have reviewed the patients History and Physical, chart, labs and discussed the procedure including the risks, benefits and alternatives for the proposed anesthesia with the patient or authorized representative who has indicated his/her understanding and acceptance.   Dental advisory given  Plan Discussed with: CRNA and Anesthesiologist  Anesthesia Plan Comments:        Anesthesia Quick Evaluation

## 2018-09-03 NOTE — Op Note (Signed)
    Patient name: Whitney Walker MRN: [id number] DOB: Feb 19, 1956 Sex: female  09/03/2018 Pre-operative Diagnosis: End-stage renal disease Post-operative diagnosis:  Same Surgeon:  Erlene Quan C. Donzetta Matters, MD Assistant: Laurence Slate, PA Procedure Performed:  Left arm second stage basilic vein fistula transposition  Indications: 62 year old female has a history of end-stage renal disease currently on dialysis via catheter.  She initially had a failed left cephalic vein fistula creation now is undergone basilic vein fistula creation that has matured although not fully.  She is in care for second stage.  Findings: The vein was actually dilated up to approximately 5 mm throughout its course.  It was severely diseased in its most proximal aspect and this was resected.  Following completion of transposition was a strong thrill in the graft there is also palpable radial pulse at the wrist.  Patient may require completion of maturation over the next 6 weeks to 2 months prior to use of the fistula.   Procedure:  The patient was identified in the holding area and taken to the operating room where she is placed supine operative table and general anesthesia was induced.  She was sterilely prepped draped the left arm in usual fashion, she was given antibiotics, a timeout was called.  We began with reopening her previous incision dissecting down onto the fistula which was noted to be quite disease.  We then dissected up to the anastomosis placed Vesseloops around the brachial artery.  We then continue to dissected out the fistula itself protecting the nerve throughout its course.  We made a second incision up near the axilla were able to ligate branches between clips and ties.  The fascia was marked for orientation.  I then clamped the brachial artery proximally and distally to the anastomosis and then placed the clamp on the fistula and transected at the previous anastomosis.  I then tunneled the fistula flushed with  heparinized saline and reclamped it.  I did transect 2 cm of it that was quite diseased.  I then sewed and and at the previous anastomosis with 6-0 Prolene suture.  Prior to completion I allowed flushing maneuvers.  I completion was a strong thrill in the vein I did free of some soft tissue in the axilla to let it sit better.  There is a palpable radial pulse.  We irrigated the wounds obtained hemostasis and closed in layers with Vicryl and Monocryl.  Dermabond was placed to the level of the skin.  She was allowed away from anesthesia having tolerated procedure well without immediate complication.  All counts were correct at completion.  EBL: 50 cc   Brandon C. Donzetta Matters, MD [facility] Office: [phone] Pager: [phone]

## 2018-09-03 NOTE — H&P (Signed)
HP  History of Present Illness:  62 year old female follows up after left first stage basilic vein fistula.  She had a previous failed cephalic vein fistula on the same side.  She does not have any swelling she does not have any pain in her left arm she does have some numbness of the forearm.  Past Medical History:  Diagnosis Date  . Anemia   . Anxiety   . CHF (congestive heart failure) ([place])   . Colon cancer [facility])    treatment surgery  . Complication of anesthesia    after first C- [place] "couldnt walk after", patient denies having a spinal  . COPD (chronic obstructive pulmonary disease) ([place])   . Coronary artery disease   . Depression   . DVT (deep venous thrombosis) ([place])   . ESRD (end stage renal disease) ([place])    Hemo: MWF  . History of blood transfusion 04/2018  . Hypertension    07/07/18- no longer takes BP medications  . Meningitis   . PE (pulmonary embolism)   . Peripheral vascular disease ([place])   . Restless legs   . Shortness of breath    with exertion  . Sleep apnea   . Vertigo     Past Surgical History:  Procedure Laterality Date  . ABDOMINAL AORTAGRAM N/A 07/26/2013   Procedure: ABDOMINAL Maxcine Ham;  Surgeon: [name] Spooner, MD;  Location: [facility] CATH LAB;  Service: Cardiovascular;  Laterality: N/A;  . ABDOMINAL HYSTERECTOMY    . AV FISTULA PLACEMENT Left 05/05/2018   Procedure: ARTERIOVENOUS (AV) FISTULA CREATION BRACHIOCEPHALIC;  Surgeon: Waynetta Sandy, MD;  Location: [place];  Service: Vascular;  Laterality: Left;  . AV FISTULA PLACEMENT Left 07/16/2018   Procedure: ARTERIOVENOUS FISTULA CREATION;  Surgeon: Waynetta Sandy, MD;  Location: [place];  Service: Vascular;  Laterality: Left;  . BREAST BIOPSY Left   . CESAREAN SECTION     X 3 [id number]  . CHOLECYSTECTOMY    . CHOLECYSTECTOMY  1980/s  . COLECTOMY  2010  . DIVERTICULOSIS SURGERY-2002  2012  . FEMORAL-POPLITEAL BYPASS GRAFT Left 08/13/2013   Procedure: BYPASS GRAFT FEMORAL-POPLITEAL  ARTERY WITH NON-REVERSED SAPHANEOUS VEIN; ULTRASOUND GUIDED;  Surgeon: Mal [name], MD;  Location: [place];  Service: Vascular;  Laterality: Left;  . INSERTION OF DIALYSIS CATHETER Right 04/30/2018   Procedure: INSERTION OF Right Internal Jugular DIALYSIS CATHETER;  Surgeon: [name], MD;  Location: [place];  Service: Vascular;  Laterality: Right;  . LOWER EXTREMITY ANGIOGRAM Left 07/26/2013   Procedure: LOWER EXTREMITY ANGIOGRAM;  Surgeon: [name] Kimberly, MD;  Location: [facility] CATH LAB;  Service: Cardiovascular;  Laterality: Left;    Allergies  Allergen Reactions  . Gadolinium Derivatives Hives and Other (See Comments)    HIVES, Desc: HIVES W/ "DYE" USED FOR 1ST CT SCAN BUT NOT 2ND, NO PREMEDS USED, PT UNCERTAIN OF CIRCUMSTANCES,,?POSSIBLE MRI CONTRAST ALLERGY, ALL STUDIES DONE "SOMEWHERE" IN [place]//A.C., Onset Date: [id number]  . Iohexol Other (See Comments)     Code: HIVES, Desc: HIVES W/ "DYE" USED FOR 1ST CT SCAN BUT NOT 2ND, NO PREMEDS USED, PT UNCERTAIN OF CIRCUMSTANCES,,?POSSIBLE MRI CONTRAST ALLERGY, ALL STUDIES DONE "SOMEWHERE" IN [place]//A.C., Onset Date: [id number]     Prior to Admission medications   Medication Sig Start Date End Date Taking? Authorizing Provider  acetaminophen (TYLENOL) 500 MG tablet Take 1,000 mg by mouth 2 (two) times daily as needed for moderate pain or headache.   Yes [provider]  albuterol (PROVENTIL HFA;VENTOLIN HFA) 108 (90  Base) MCG/ACT inhaler Inhale 2 puffs into the lungs every 6 (six) hours as needed for wheezing or shortness of breath.    Yes [provider]  apixaban (ELIQUIS) 2.5 MG TABS tablet Take 1 tablet (2.5 mg total) by mouth 2 (two) times daily. 05/07/18  Yes Rai, Ripudeep K, MD  Biotin 5000 MCG TABS Take 1 tablet by mouth daily.   Yes [provider]  calcitRIOL (ROCALTROL) 0.5 MCG capsule Take 1 capsule (0.5 mcg total) by mouth every other day. Patient taking differently: Take 1 mcg by mouth 3 (three)  times a week. Given at dialysis 05/09/18  Yes Rai, Ripudeep K, MD  ferric citrate (AURYXIA) 1 GM 210 MG(Fe) tablet Take 210-420 mg by mouth See admin instructions. Take 210-420 mg with each meal (based on size of meal) and 210 mg with each snack    Yes [provider]  meclizine (ANTIVERT) 12.5 MG tablet Take 12.5 mg by mouth 3 (three) times daily as needed for dizziness.   Yes [provider]  multivitamin (RENA-VIT) TABS tablet Take 1 tablet by mouth at bedtime. 05/07/18  Yes Rai, Ripudeep K, MD  pramipexole (MIRAPEX) 0.125 MG tablet Take 0.125 mg by mouth 3 (three) times daily.   Yes [provider]  promethazine (PHENERGAN) 12.5 MG tablet Take 12.5 mg by mouth every 6 (six) hours as needed for nausea or vomiting.   Yes [provider]  mupirocin ointment (BACTROBAN) 2 % Apply 1 application topically daily as needed (skin bumps).     [provider]  NYSTATIN powder Apply 1 Bottle topically 2 (two) times daily as needed (dryness).  05/23/17   [provider]    Social History   Socioeconomic History  . Marital status: Single    Spouse name: Not on file  . Number of children: 4  . Years of education: 12th gd  . Highest education level: Not on file  Occupational History  . Occupation: N/A  Social Needs  . Financial resource strain: Not on file  . Food insecurity:    Worry: Not on file    Inability: Not on file  . Transportation needs:    Medical: Not on file    Non-medical: Not on file  Tobacco Use  . Smoking status: Current Some Day Smoker    Packs/day: 1.00    Years: 40.00    Pack years: 40.00    Types: Cigarettes  . Smokeless tobacco: Former Systems developer  . Tobacco comment: maybe 1 cig a day  Substance and Sexual Activity  . Alcohol use: No    Alcohol/week: 0.0 standard drinks    Comment: she used to drink alcohol, quit in 2010   . Drug use: No  . Sexual activity: Not on file  Lifestyle  . Physical activity:    Days per week:  Not on file    Minutes per session: Not on file  . Stress: Not on file  Relationships  . Social connections:    Talks on phone: Not on file    Gets together: Not on file    Attends religious service: Not on file    Active member of club or organization: Not on file    Attends meetings of clubs or organizations: Not on file    Relationship status: Not on file  . Intimate partner violence:    Fear of current or ex partner: Not on file    Emotionally abused: Not on file    Physically abused: Not  on file    Forced sexual activity: Not on file  Other Topics Concern  . Not on file  Social History Narrative   2 cups of coffee a day      Family History  Problem Relation Age of Onset  . Cancer Mother 69       breast and bone  . Cancer Father 54       prostate  . Hypertension Sister   . Bleeding Disorder Sister   . Cancer Cousin 20       breast cancer   . Hypertension Daughter   . Breast cancer Neg Hx     ROS: Left arm numbness   Physical Examination  Vitals:   09/03/18 0942  BP: (!) 130/59  Pulse: (!) 102  Resp: 18  Temp: 98.1 F (36.7 C)  SpO2: 97%   Body mass index is 34.37 kg/m.  awake alert oriented Nonlabored respirations Palpable radial ulnar pulses on the left Very strong thrill left upper extremity on the medial aspect  CBC    Component Value Date/Time   WBC 14.4 (H) 06/11/2018 1241   RBC 3.64 (L) 06/11/2018 1241   HGB 12.9 07/16/2018 1032   HGB 10.9 (L) 07/07/2017 1235   HCT 38.0 07/16/2018 1032   HCT 35.0 07/07/2017 1235   PLT 161 06/11/2018 1241   PLT 275 07/07/2017 1235   MCV 93.1 06/11/2018 1241   MCV 85.2 07/07/2017 1235   MCH 28.6 06/11/2018 1241   MCHC 30.7 (L) 06/11/2018 1241   RDW 17.0 (H) 06/11/2018 1241   RDW 15.8 (H) 07/07/2017 1235   LYMPHSABS 3.3 06/11/2018 1241   LYMPHSABS 2.4 07/07/2017 1235   MONOABS 0.7 06/11/2018 1241   MONOABS 0.8 07/07/2017 1235   EOSABS 0.2 06/11/2018 1241   EOSABS 0.2 07/07/2017 1235   BASOSABS  0.0 06/11/2018 1241   BASOSABS 0.0 07/07/2017 1235    BMET    Component Value Date/Time   NA 139 07/16/2018 1032   NA 138 07/07/2017 1235   K 4.6 07/16/2018 1032   K 4.5 07/07/2017 1235   CL 101 06/11/2018 1241   CL 111 (H) 12/04/2012 1056   CO2 25 06/11/2018 1241   CO2 17 (L) 07/07/2017 1235   GLUCOSE 99 07/16/2018 1032   GLUCOSE 85 07/07/2017 1235   GLUCOSE 97 12/04/2012 1056   BUN 33 (H) 06/11/2018 1241   BUN 40.8 (H) 07/07/2017 1235   CREATININE 6.05 (HH) 06/11/2018 1241   CREATININE 3.4 (HH) 07/07/2017 1235   CALCIUM 9.9 06/11/2018 1241   CALCIUM 9.5 07/07/2017 1235   GFRNONAA 7 (L) 06/11/2018 1241   GFRAA 8 (L) 06/11/2018 1241    COAGS: Lab Results  Component Value Date   INR 0.98 07/16/2018   INR 1.19 05/07/2018   INR 1.29 04/29/2018      ASSESSMENT/PLAN: This is a 62 y.o. female has end-stage renal disease currently on dialysis via right IJ catheter.  She has a left first stage basilic vein fistula that is maturing well.  We will plan second stage today in the operating room.  Brandon C. Donzetta Matters, MD [facility] [place] Office: [phone] Pager: [phone]

## 2018-09-03 NOTE — Discharge Instructions (Signed)
° °  [facility] ° °Discharge Instructions ° °AV Fistula or Graft Surgery for Dialysis Access ° °Please refer to the following instructions for your post-procedure care. Your surgeon or physician assistant will discuss any changes with you. ° °Activity ° °You may drive the day following your surgery, if you are comfortable and no longer taking prescription pain medication. Resume full activity as the soreness in your incision resolves. ° °Bathing/Showering ° °You may shower after you go home. Keep your incision dry for 48 hours. Do not soak in a bathtub, hot tub, or swim until the incision heals completely. You may not shower if you have a hemodialysis catheter. ° °Incision Care ° °Clean your incision with mild soap and water after 48 hours. Pat the area dry with a clean towel. You do not need a bandage unless otherwise instructed. Do not apply any ointments or creams to your incision. You may have skin glue on your incision. Do not peel it off. It will come off on its own in about one week. Your arm may swell a bit after surgery. To reduce swelling use pillows to elevate your arm so it is above your heart. Your doctor will tell you if you need to lightly wrap your arm with an ACE bandage. ° °Diet ° °Resume your normal diet. There are not special food restrictions following this procedure. In order to heal from your surgery, it is CRITICAL to get adequate nutrition. Your body requires vitamins, minerals, and protein. Vegetables are the best source of vitamins and minerals. Vegetables also provide the perfect balance of protein. Processed food has little nutritional value, so try to avoid this. ° °Medications ° °Resume taking all of your medications. If your incision is causing pain, you may take over-the counter pain relievers such as acetaminophen (Tylenol). If you were prescribed a stronger pain medication, please be aware these medications can cause nausea and constipation. Prevent  nausea by taking the medication with a snack or meal. Avoid constipation by drinking plenty of fluids and eating foods with high amount of fiber, such as fruits, vegetables, and grains. Do not take Tylenol if you are taking prescription pain medications. ° ° ° ° °Follow up °Your surgeon may want to see you in the office following your access surgery. If so, this will be arranged at the time of your surgery. ° °Please call us immediately for any of the following conditions: ° °Increased pain, redness, drainage (pus) from your incision site °Fever of 101 degrees or higher °Severe or worsening pain at your incision site °Hand pain or numbness. ° °Reduce your risk of vascular disease: ° °Stop smoking. If you would like help, call [facility] at 1-800-QUIT-NOW (1-800-784-8669) or [place] at 336-586-4000 ° °Manage your cholesterol °Maintain a desired weight °Control your diabetes °Keep your blood pressure down ° °Dialysis ° °It will take several weeks to several months for your new dialysis access to be ready for use. Your surgeon will determine when it is OK to use it. Your nephrologist will continue to direct your dialysis. You can continue to use your Permcath until your new access is ready for use. ° °If you have any questions, please call the office at [phone]. ° °

## 2018-09-03 NOTE — Transfer of Care (Signed)
Immediate Anesthesia Transfer of Care Note  Patient: Whitney Walker  Procedure(s) Performed: BASILIC VEIN TRANSPOSITION SECOND STAGE (Left Arm Upper)  Patient Location: PACU  Anesthesia Type:General  Level of Consciousness: awake and alert   Airway & Oxygen Therapy: Patient Spontanous Breathing and Patient connected to nasal cannula oxygen  Post-op Assessment: Report given to RN, Post -op Vital signs reviewed and stable and Patient moving all extremities X 4  Post vital signs: Reviewed and stable  Last Vitals:  Vitals Value Taken Time  BP 118/82 09/03/2018  1:41 PM  Temp    Pulse 92 09/03/2018  1:43 PM  Resp 14 09/03/2018  1:43 PM  SpO2 100 % 09/03/2018  1:43 PM  Vitals shown include unvalidated device data.  Last Pain:  Vitals:   09/03/18 0942  TempSrc: Oral         Complications: No apparent anesthesia complications

## 2018-09-04 ENCOUNTER — Encounter (HOSPITAL_COMMUNITY): Payer: Self-pay | Admitting: Vascular Surgery

## 2018-09-04 DIAGNOSIS — D509 Iron deficiency anemia, unspecified: Secondary | ICD-10-CM | POA: Diagnosis not present

## 2018-09-04 DIAGNOSIS — N186 End stage renal disease: Secondary | ICD-10-CM | POA: Diagnosis not present

## 2018-09-04 DIAGNOSIS — D631 Anemia in chronic kidney disease: Secondary | ICD-10-CM | POA: Diagnosis not present

## 2018-09-04 DIAGNOSIS — Z23 Encounter for immunization: Secondary | ICD-10-CM | POA: Diagnosis not present

## 2018-09-04 DIAGNOSIS — N2581 Secondary hyperparathyroidism of renal origin: Secondary | ICD-10-CM | POA: Diagnosis not present

## 2018-09-05 NOTE — Anesthesia Postprocedure Evaluation (Signed)
Anesthesia Post Note  Patient: Whitney Walker  Procedure(s) Performed: BASILIC VEIN TRANSPOSITION SECOND STAGE (Left Arm Upper)     Anesthesia Post Evaluation  Last Vitals:  Vitals:   09/03/18 1445 09/03/18 1518  BP: (!) 102/54 114/72  Pulse: 86 85  Resp: 10 12  Temp:    SpO2: 93% 98%    Last Pain:  Vitals:   09/03/18 1445  TempSrc:   PainSc: 8                  Simcha Farrington

## 2018-09-07 DIAGNOSIS — Z23 Encounter for immunization: Secondary | ICD-10-CM | POA: Diagnosis not present

## 2018-09-07 DIAGNOSIS — D631 Anemia in chronic kidney disease: Secondary | ICD-10-CM | POA: Diagnosis not present

## 2018-09-07 DIAGNOSIS — D509 Iron deficiency anemia, unspecified: Secondary | ICD-10-CM | POA: Diagnosis not present

## 2018-09-07 DIAGNOSIS — N186 End stage renal disease: Secondary | ICD-10-CM | POA: Diagnosis not present

## 2018-09-07 DIAGNOSIS — N2581 Secondary hyperparathyroidism of renal origin: Secondary | ICD-10-CM | POA: Diagnosis not present

## 2018-09-09 DIAGNOSIS — Z23 Encounter for immunization: Secondary | ICD-10-CM | POA: Diagnosis not present

## 2018-09-09 DIAGNOSIS — N2581 Secondary hyperparathyroidism of renal origin: Secondary | ICD-10-CM | POA: Diagnosis not present

## 2018-09-09 DIAGNOSIS — N186 End stage renal disease: Secondary | ICD-10-CM | POA: Diagnosis not present

## 2018-09-09 DIAGNOSIS — D631 Anemia in chronic kidney disease: Secondary | ICD-10-CM | POA: Diagnosis not present

## 2018-09-09 DIAGNOSIS — D509 Iron deficiency anemia, unspecified: Secondary | ICD-10-CM | POA: Diagnosis not present

## 2018-09-10 ENCOUNTER — Ambulatory Visit: Payer: Medicare Other | Admitting: Psychiatry

## 2018-09-11 DIAGNOSIS — N2581 Secondary hyperparathyroidism of renal origin: Secondary | ICD-10-CM | POA: Diagnosis not present

## 2018-09-11 DIAGNOSIS — D631 Anemia in chronic kidney disease: Secondary | ICD-10-CM | POA: Diagnosis not present

## 2018-09-11 DIAGNOSIS — D509 Iron deficiency anemia, unspecified: Secondary | ICD-10-CM | POA: Diagnosis not present

## 2018-09-11 DIAGNOSIS — N186 End stage renal disease: Secondary | ICD-10-CM | POA: Diagnosis not present

## 2018-09-11 DIAGNOSIS — Z23 Encounter for immunization: Secondary | ICD-10-CM | POA: Diagnosis not present

## 2018-09-14 DIAGNOSIS — D631 Anemia in chronic kidney disease: Secondary | ICD-10-CM | POA: Diagnosis not present

## 2018-09-14 DIAGNOSIS — D509 Iron deficiency anemia, unspecified: Secondary | ICD-10-CM | POA: Diagnosis not present

## 2018-09-14 DIAGNOSIS — N2581 Secondary hyperparathyroidism of renal origin: Secondary | ICD-10-CM | POA: Diagnosis not present

## 2018-09-14 DIAGNOSIS — Z23 Encounter for immunization: Secondary | ICD-10-CM | POA: Diagnosis not present

## 2018-09-14 DIAGNOSIS — N186 End stage renal disease: Secondary | ICD-10-CM | POA: Diagnosis not present

## 2018-09-15 ENCOUNTER — Ambulatory Visit (HOSPITAL_COMMUNITY): Payer: Medicare Other

## 2018-09-15 ENCOUNTER — Ambulatory Visit: Payer: Medicare Other | Admitting: Family

## 2018-09-16 DIAGNOSIS — D631 Anemia in chronic kidney disease: Secondary | ICD-10-CM | POA: Diagnosis not present

## 2018-09-16 DIAGNOSIS — N186 End stage renal disease: Secondary | ICD-10-CM | POA: Diagnosis not present

## 2018-09-16 DIAGNOSIS — D509 Iron deficiency anemia, unspecified: Secondary | ICD-10-CM | POA: Diagnosis not present

## 2018-09-16 DIAGNOSIS — N2581 Secondary hyperparathyroidism of renal origin: Secondary | ICD-10-CM | POA: Diagnosis not present

## 2018-09-16 DIAGNOSIS — Z23 Encounter for immunization: Secondary | ICD-10-CM | POA: Diagnosis not present

## 2018-09-18 DIAGNOSIS — N186 End stage renal disease: Secondary | ICD-10-CM | POA: Diagnosis not present

## 2018-09-18 DIAGNOSIS — N2581 Secondary hyperparathyroidism of renal origin: Secondary | ICD-10-CM | POA: Diagnosis not present

## 2018-09-18 DIAGNOSIS — Z23 Encounter for immunization: Secondary | ICD-10-CM | POA: Diagnosis not present

## 2018-09-18 DIAGNOSIS — D509 Iron deficiency anemia, unspecified: Secondary | ICD-10-CM | POA: Diagnosis not present

## 2018-09-18 DIAGNOSIS — D631 Anemia in chronic kidney disease: Secondary | ICD-10-CM | POA: Diagnosis not present

## 2018-09-19 ENCOUNTER — Encounter: Payer: Self-pay | Admitting: Nurse Practitioner

## 2018-09-19 DIAGNOSIS — G2581 Restless legs syndrome: Secondary | ICD-10-CM | POA: Insufficient documentation

## 2018-09-19 DIAGNOSIS — R42 Dizziness and giddiness: Secondary | ICD-10-CM | POA: Insufficient documentation

## 2018-09-21 DIAGNOSIS — N2581 Secondary hyperparathyroidism of renal origin: Secondary | ICD-10-CM | POA: Diagnosis not present

## 2018-09-21 DIAGNOSIS — D509 Iron deficiency anemia, unspecified: Secondary | ICD-10-CM | POA: Diagnosis not present

## 2018-09-21 DIAGNOSIS — N186 End stage renal disease: Secondary | ICD-10-CM | POA: Diagnosis not present

## 2018-09-21 DIAGNOSIS — D631 Anemia in chronic kidney disease: Secondary | ICD-10-CM | POA: Diagnosis not present

## 2018-09-21 DIAGNOSIS — Z23 Encounter for immunization: Secondary | ICD-10-CM | POA: Diagnosis not present

## 2018-09-21 IMAGING — CR DG ABD PORTABLE 1V
2 series · 2 of 2 positions shown · non-contrast
Comparison: [DATE]

CLINICAL DATA: Nausea and vomiting tonight.

EXAM:
PORTABLE ABDOMEN - 1 VIEW

[AP (1 of 2)]
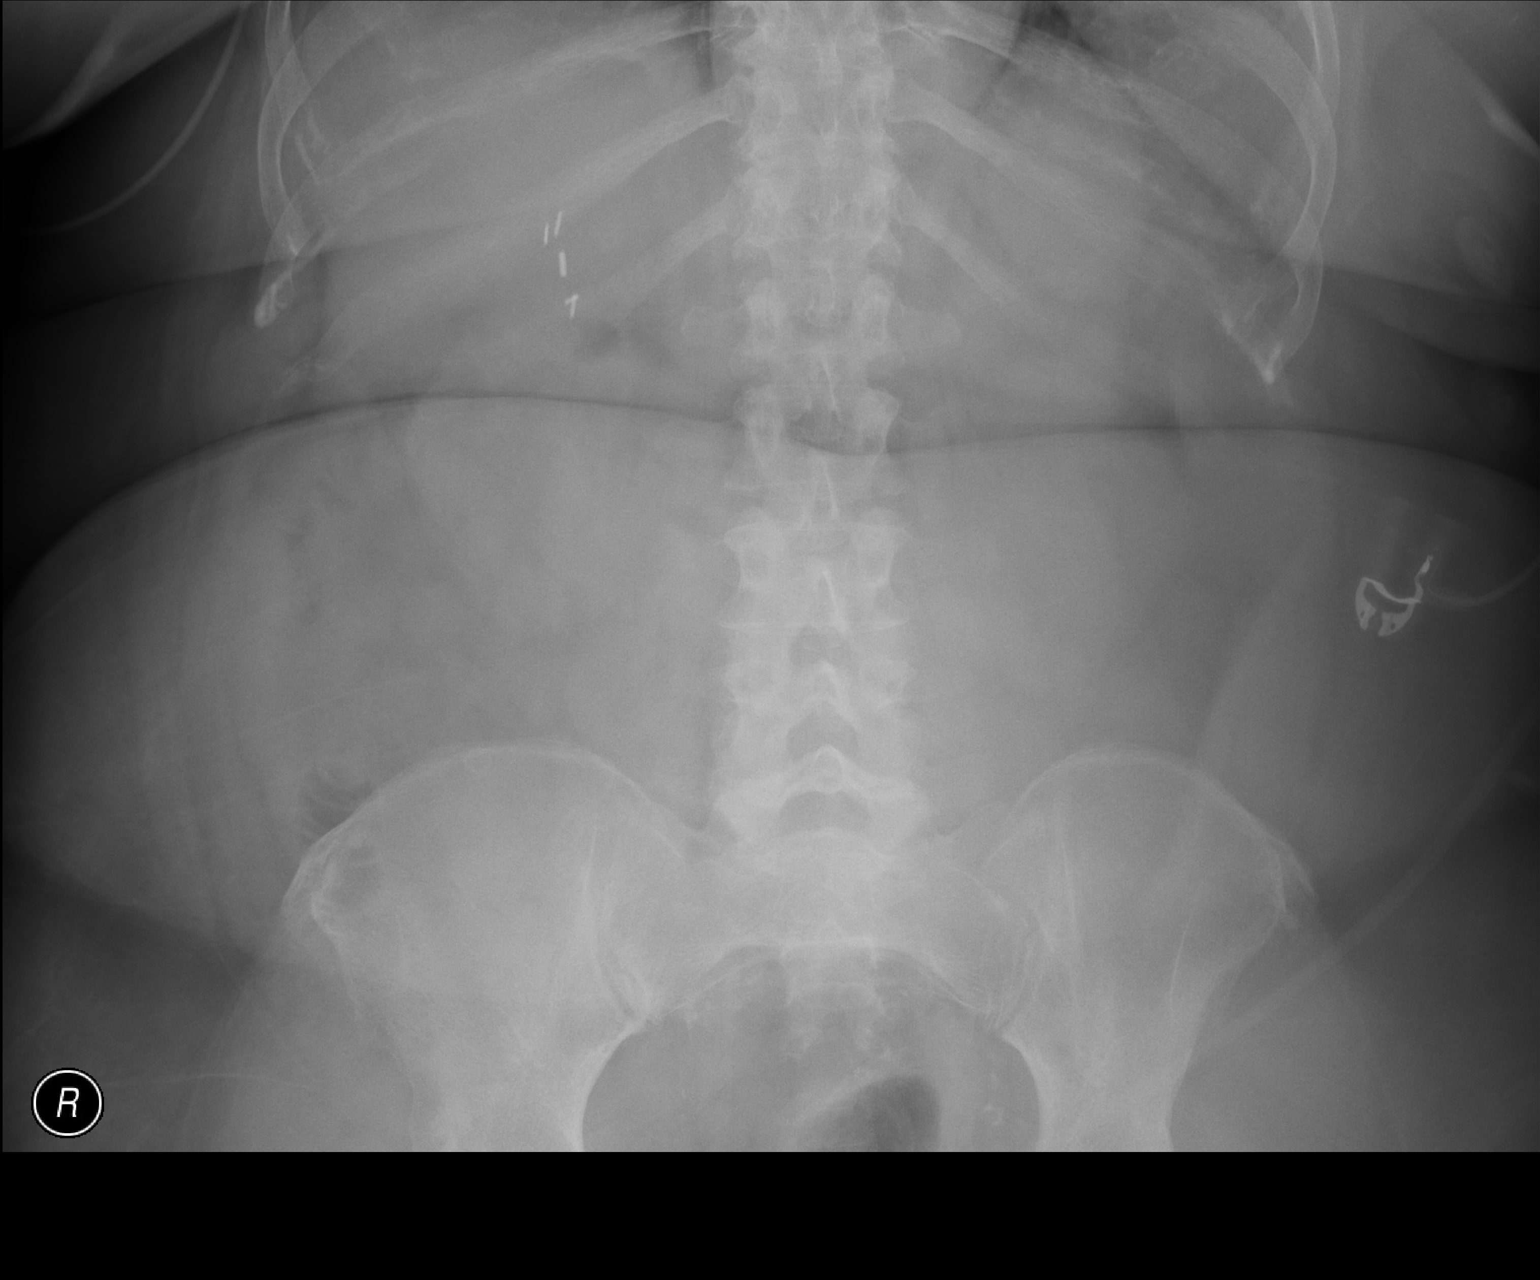

[AP (2 of 2)]
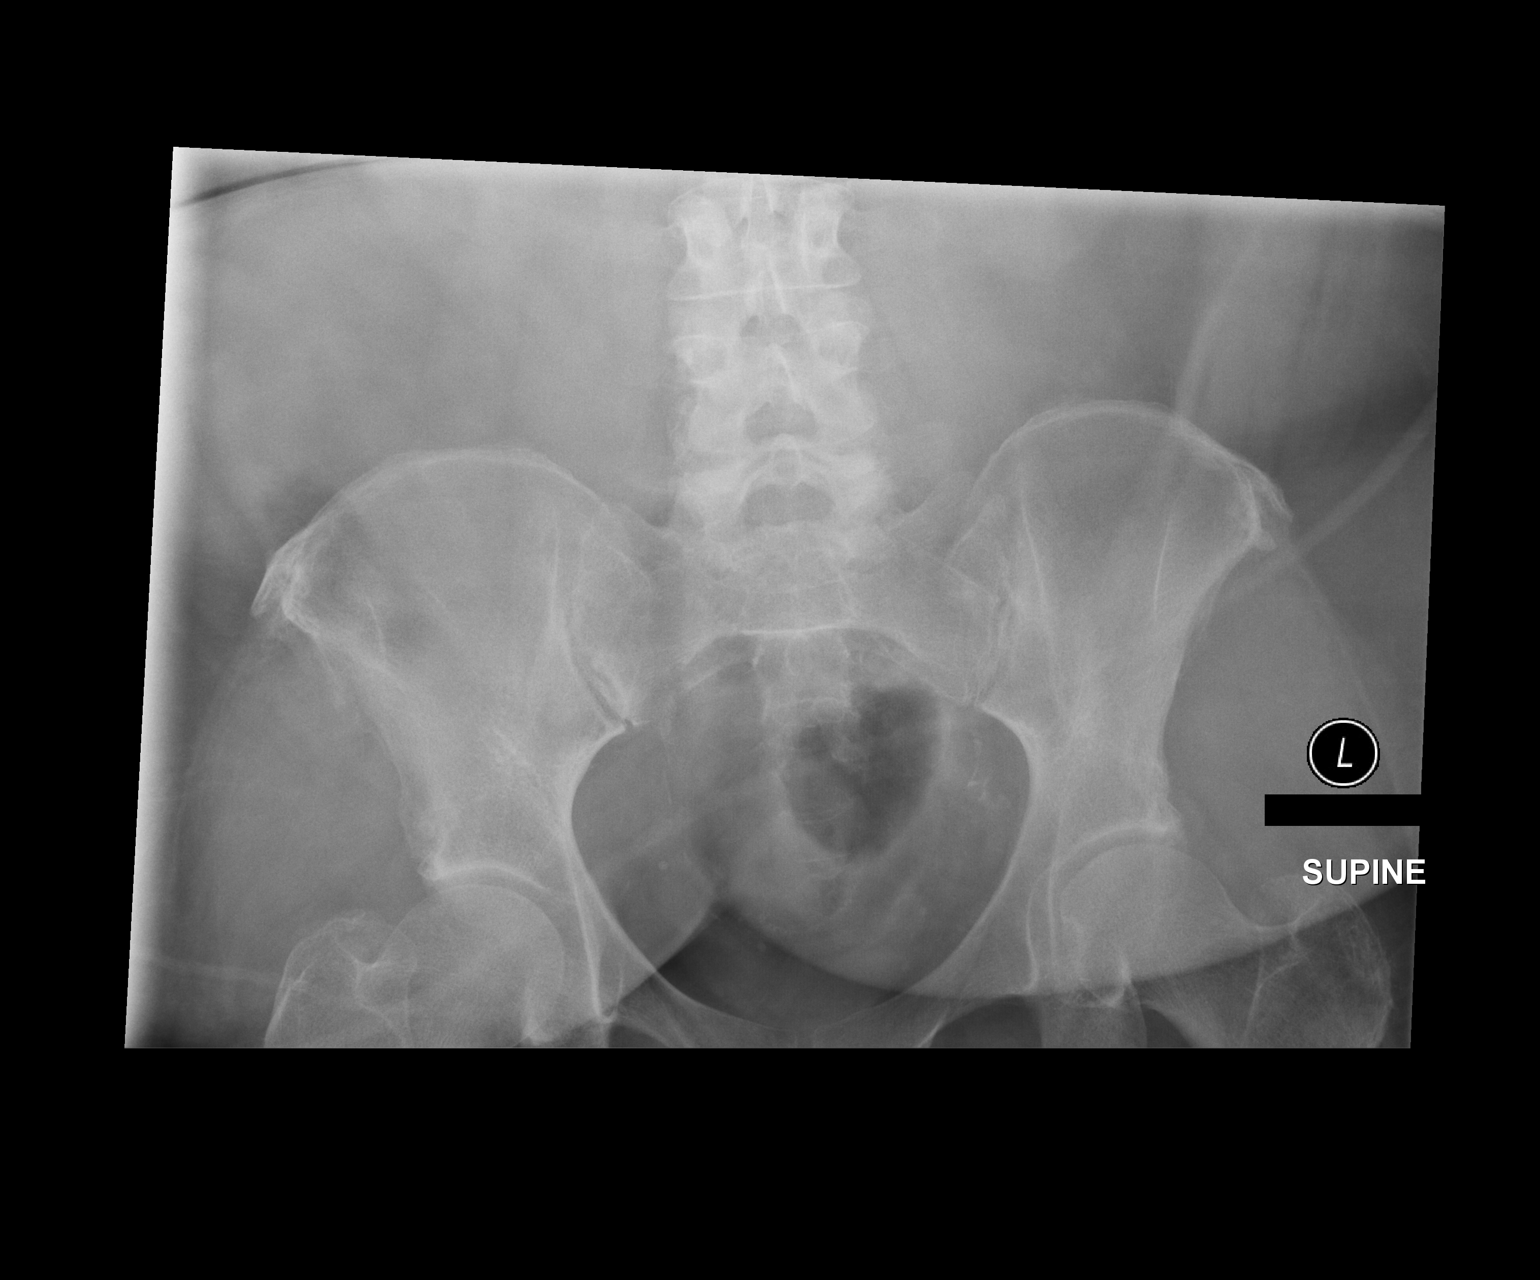

[2 of 2 positions shown; findings below may reference images not displayed]

FINDINGS: The bowel gas pattern is normal. No radio-opaque calculi or other
significant radiographic abnormality are seen. Cholecystectomy clips
are visible in the right upper quadrant.
IMPRESSION: Negative.

## 2018-09-22 ENCOUNTER — Ambulatory Visit: Payer: Medicare Other | Admitting: Psychiatry

## 2018-09-23 DIAGNOSIS — N2581 Secondary hyperparathyroidism of renal origin: Secondary | ICD-10-CM | POA: Diagnosis not present

## 2018-09-23 DIAGNOSIS — N186 End stage renal disease: Secondary | ICD-10-CM | POA: Diagnosis not present

## 2018-09-23 DIAGNOSIS — Z23 Encounter for immunization: Secondary | ICD-10-CM | POA: Diagnosis not present

## 2018-09-23 DIAGNOSIS — D509 Iron deficiency anemia, unspecified: Secondary | ICD-10-CM | POA: Diagnosis not present

## 2018-09-23 DIAGNOSIS — D631 Anemia in chronic kidney disease: Secondary | ICD-10-CM | POA: Diagnosis not present

## 2018-09-24 ENCOUNTER — Ambulatory Visit: Payer: Medicare Other | Admitting: Adult Health

## 2018-09-25 DIAGNOSIS — D631 Anemia in chronic kidney disease: Secondary | ICD-10-CM | POA: Diagnosis not present

## 2018-09-25 DIAGNOSIS — N186 End stage renal disease: Secondary | ICD-10-CM | POA: Diagnosis not present

## 2018-09-25 DIAGNOSIS — D509 Iron deficiency anemia, unspecified: Secondary | ICD-10-CM | POA: Diagnosis not present

## 2018-09-25 DIAGNOSIS — N2581 Secondary hyperparathyroidism of renal origin: Secondary | ICD-10-CM | POA: Diagnosis not present

## 2018-09-25 DIAGNOSIS — Z23 Encounter for immunization: Secondary | ICD-10-CM | POA: Diagnosis not present

## 2018-09-28 DIAGNOSIS — D509 Iron deficiency anemia, unspecified: Secondary | ICD-10-CM | POA: Diagnosis not present

## 2018-09-28 DIAGNOSIS — N186 End stage renal disease: Secondary | ICD-10-CM | POA: Diagnosis not present

## 2018-09-28 DIAGNOSIS — N2581 Secondary hyperparathyroidism of renal origin: Secondary | ICD-10-CM | POA: Diagnosis not present

## 2018-09-28 DIAGNOSIS — D631 Anemia in chronic kidney disease: Secondary | ICD-10-CM | POA: Diagnosis not present

## 2018-09-28 DIAGNOSIS — Z23 Encounter for immunization: Secondary | ICD-10-CM | POA: Diagnosis not present

## 2018-09-29 DIAGNOSIS — I129 Hypertensive chronic kidney disease with stage 1 through stage 4 chronic kidney disease, or unspecified chronic kidney disease: Secondary | ICD-10-CM | POA: Diagnosis not present

## 2018-09-29 DIAGNOSIS — N186 End stage renal disease: Secondary | ICD-10-CM | POA: Diagnosis not present

## 2018-09-29 DIAGNOSIS — Z992 Dependence on renal dialysis: Secondary | ICD-10-CM | POA: Diagnosis not present

## 2018-09-30 DIAGNOSIS — N186 End stage renal disease: Secondary | ICD-10-CM | POA: Diagnosis not present

## 2018-09-30 DIAGNOSIS — N2581 Secondary hyperparathyroidism of renal origin: Secondary | ICD-10-CM | POA: Diagnosis not present

## 2018-10-02 DIAGNOSIS — Z992 Dependence on renal dialysis: Secondary | ICD-10-CM | POA: Diagnosis not present

## 2018-10-02 DIAGNOSIS — D631 Anemia in chronic kidney disease: Secondary | ICD-10-CM | POA: Diagnosis not present

## 2018-10-02 DIAGNOSIS — N186 End stage renal disease: Secondary | ICD-10-CM | POA: Diagnosis not present

## 2018-10-05 DIAGNOSIS — D631 Anemia in chronic kidney disease: Secondary | ICD-10-CM | POA: Diagnosis not present

## 2018-10-05 DIAGNOSIS — N186 End stage renal disease: Secondary | ICD-10-CM | POA: Diagnosis not present

## 2018-10-07 DIAGNOSIS — D631 Anemia in chronic kidney disease: Secondary | ICD-10-CM | POA: Diagnosis not present

## 2018-10-07 DIAGNOSIS — N186 End stage renal disease: Secondary | ICD-10-CM | POA: Diagnosis not present

## 2018-10-08 ENCOUNTER — Ambulatory Visit: Payer: Medicare Other | Admitting: Nurse Practitioner

## 2018-10-09 DIAGNOSIS — D631 Anemia in chronic kidney disease: Secondary | ICD-10-CM | POA: Diagnosis not present

## 2018-10-09 DIAGNOSIS — N186 End stage renal disease: Secondary | ICD-10-CM | POA: Diagnosis not present

## 2018-10-11 DIAGNOSIS — S2242XA Multiple fractures of ribs, left side, initial encounter for closed fracture: Secondary | ICD-10-CM | POA: Diagnosis not present

## 2018-10-12 ENCOUNTER — Ambulatory Visit: Payer: Self-pay | Admitting: Hematology

## 2018-10-12 ENCOUNTER — Ambulatory Visit: Payer: Medicare Other | Admitting: Family

## 2018-10-12 ENCOUNTER — Encounter (HOSPITAL_COMMUNITY): Payer: Medicare Other

## 2018-10-12 ENCOUNTER — Other Ambulatory Visit: Payer: Self-pay

## 2018-10-12 DIAGNOSIS — D631 Anemia in chronic kidney disease: Secondary | ICD-10-CM | POA: Diagnosis not present

## 2018-10-12 DIAGNOSIS — N186 End stage renal disease: Secondary | ICD-10-CM | POA: Diagnosis not present

## 2018-10-14 DIAGNOSIS — D631 Anemia in chronic kidney disease: Secondary | ICD-10-CM | POA: Diagnosis not present

## 2018-10-14 DIAGNOSIS — N186 End stage renal disease: Secondary | ICD-10-CM | POA: Diagnosis not present

## 2018-10-15 ENCOUNTER — Inpatient Hospital Stay: Payer: Medicare Other | Attending: Hematology

## 2018-10-15 ENCOUNTER — Inpatient Hospital Stay: Payer: Medicare Other | Admitting: Hematology

## 2018-10-16 DIAGNOSIS — N186 End stage renal disease: Secondary | ICD-10-CM | POA: Diagnosis not present

## 2018-10-16 DIAGNOSIS — D631 Anemia in chronic kidney disease: Secondary | ICD-10-CM | POA: Diagnosis not present

## 2018-10-19 DIAGNOSIS — N186 End stage renal disease: Secondary | ICD-10-CM | POA: Diagnosis not present

## 2018-10-19 DIAGNOSIS — D631 Anemia in chronic kidney disease: Secondary | ICD-10-CM | POA: Diagnosis not present

## 2018-10-21 DIAGNOSIS — D631 Anemia in chronic kidney disease: Secondary | ICD-10-CM | POA: Diagnosis not present

## 2018-10-21 DIAGNOSIS — N186 End stage renal disease: Secondary | ICD-10-CM | POA: Diagnosis not present

## 2018-10-22 ENCOUNTER — Ambulatory Visit (HOSPITAL_COMMUNITY): Admission: RE | Admit: 2018-10-22 | Payer: Medicare Other | Source: Ambulatory Visit

## 2018-10-22 ENCOUNTER — Ambulatory Visit: Payer: Medicare Other | Admitting: Family

## 2018-10-22 ENCOUNTER — Ambulatory Visit (HOSPITAL_COMMUNITY): Payer: Medicare Other

## 2018-10-26 DIAGNOSIS — N186 End stage renal disease: Secondary | ICD-10-CM | POA: Diagnosis not present

## 2018-10-26 DIAGNOSIS — N2581 Secondary hyperparathyroidism of renal origin: Secondary | ICD-10-CM | POA: Diagnosis not present

## 2018-10-27 ENCOUNTER — Ambulatory Visit (INDEPENDENT_AMBULATORY_CARE_PROVIDER_SITE_OTHER): Payer: Medicare Other | Admitting: Psychiatry

## 2018-10-27 DIAGNOSIS — F333 Major depressive disorder, recurrent, severe with psychotic symptoms: Secondary | ICD-10-CM

## 2018-10-28 DIAGNOSIS — N186 End stage renal disease: Secondary | ICD-10-CM | POA: Diagnosis not present

## 2018-10-28 DIAGNOSIS — N2581 Secondary hyperparathyroidism of renal origin: Secondary | ICD-10-CM | POA: Diagnosis not present

## 2018-10-29 ENCOUNTER — Encounter: Payer: Self-pay | Admitting: Nurse Practitioner

## 2018-10-29 ENCOUNTER — Ambulatory Visit (INDEPENDENT_AMBULATORY_CARE_PROVIDER_SITE_OTHER): Payer: Medicare Other | Admitting: Nurse Practitioner

## 2018-10-29 VITALS — BP 150/72 | HR 90 | Temp 98.2°F | Ht 61.0 in | Wt 195.0 lb

## 2018-10-29 DIAGNOSIS — S2242XD Multiple fractures of ribs, left side, subsequent encounter for fracture with routine healing: Secondary | ICD-10-CM | POA: Diagnosis not present

## 2018-10-29 DIAGNOSIS — W06XXXD Fall from bed, subsequent encounter: Secondary | ICD-10-CM | POA: Diagnosis not present

## 2018-10-29 DIAGNOSIS — I10 Essential (primary) hypertension: Secondary | ICD-10-CM

## 2018-10-29 DIAGNOSIS — Z992 Dependence on renal dialysis: Secondary | ICD-10-CM

## 2018-10-29 NOTE — Patient Instructions (Signed)
Rib Fracture A rib fracture is a break or crack in one of the bones of the ribs. The ribs are like a cage that goes around your upper chest. A broken or cracked rib is often painful, but most do not cause other problems. Most rib fractures heal on their own in 1-3 months. Follow these instructions at home:  Avoid activities that cause pain to the injured area. Protect your injured area.  Slowly increase activity as told by your doctor.  Take medicine as told by your doctor.  Put ice on the injured area for the first 1-2 days after you have been treated or as told by your doctor. ? Put ice in a plastic bag. ? Place a towel between your skin and the bag. ? Leave the ice on for 15-20 minutes at a time, every 2 hours while you are awake.  Do deep breathing as told by your doctor. You may be told to: ? Take deep breaths many times a day. ? Cough many times a day while hugging a pillow. ? Use a device (incentive spirometer) to perform deep breathing many times a day.  Drink enough fluids to keep your pee (urine) clear or pale yellow.  Do not wear a rib belt or binder. These do not allow you to breathe deeply. Get help right away if:  You have a fever.  You have trouble breathing.  You cannot stop coughing.  You cough up thick or bloody spit (mucus).  You feel sick to your stomach (nauseous), throw up (vomit), or have belly (abdominal) pain.  Your pain gets worse and medicine does not help. This information is not intended to replace advice given to you by your health care provider. Make sure you discuss any questions you have with your health care provider. Document Released: 09/24/2008 Document Revised: 05/23/2016 Document Reviewed: 02/17/2013 Elsevier Interactive Patient Education  2018 [employer] Inc.  

## 2018-10-29 NOTE — Progress Notes (Addendum)
Subjective:     Patient ID: Whitney Walker , female    DOB: 15-Jul-1956 , 62 y.o.   MRN: [id number]   Chief Complaint  Patient presents with  . broken ribs    pt states she fell and broke her ribs she stated it is hard to breathe and was told to f/u by the orthopaedics to get another xray.    HPI  Fractured ribs - while in [place] and fell diagnosed with rib fracture, seen dialysis provider who said she can't take the pain medications while on dialysis due to risk for low blood pressure.  She had dosed off while lying on a bed and fell out of the bed.  Hurts to sit up and breath.  With every movement she has pain.  During dialysis yesterday she was having excrutiating pain and was taken off dialysis early to go and take medications.  She is having concerns with her current dialysis center and says she is not going back ([facility]).    Left side rib multiple rib fractures (10/11/18).  She was given Vicodin by Dr. Cherlynn June.      Past Medical History:  Diagnosis Date  . Anemia   . Anxiety   . CHF (congestive heart failure) ([place])   . Colon cancer [facility])    treatment surgery  . Complication of anesthesia    after first [facility] [place] "couldnt walk after", patient denies having a spinal  . COPD (chronic obstructive pulmonary disease) ([place])   . Coronary artery disease   . Depression   . DVT (deep venous thrombosis) ([place])   . ESRD (end stage renal disease) ([place])    Hemo: MWF  . History of blood transfusion 04/2018  . Hypertension    07/07/18- no longer takes BP medications  . Meningitis   . PE (pulmonary embolism)   . Peripheral vascular disease ([place])   . Restless legs   . Shortness of breath    with exertion  . Sleep apnea   . Vertigo      Family History  Problem Relation Age of Onset  . Cancer Mother 94       breast and bone  . Cancer Father 89       prostate  . Hypertension Sister   . Bleeding Disorder Sister   . Cancer Cousin 20       breast cancer   .  Hypertension Daughter   . Breast cancer Neg Hx      Current Outpatient Medications:  .  acetaminophen (TYLENOL) 500 MG tablet, Take 1,000 mg by mouth 2 (two) times daily as needed for moderate pain or headache., Disp: , Rfl:  .  albuterol (PROVENTIL HFA;VENTOLIN HFA) 108 (90 Base) MCG/ACT inhaler, Inhale 2 puffs into the lungs every 6 (six) hours as needed for wheezing or shortness of breath. , Disp: , Rfl:  .  apixaban (ELIQUIS) 2.5 MG TABS tablet, Take 1 tablet (2.5 mg total) by mouth 2 (two) times daily., Disp: 60 tablet, Rfl: 5 .  Biotin 5000 MCG TABS, Take 1 tablet by mouth daily., Disp: , Rfl:  .  calcitRIOL (ROCALTROL) 0.5 MCG capsule, Take 1 capsule (0.5 mcg total) by mouth every other day. (Patient taking differently: Take 1 mcg by mouth 3 (three) times a week. Given at dialysis), Disp: 30 capsule, Rfl: 2 .  ferric citrate (AURYXIA) 1 GM 210 MG(Fe) tablet, Take 210-420 mg by mouth See admin instructions. Take 210-420 mg with each meal (based on  size of meal) and 210 mg with each snack , Disp: , Rfl:  .  meclizine (ANTIVERT) 12.5 MG tablet, Take 12.5 mg by mouth 3 (three) times daily as needed for dizziness., Disp: , Rfl:  .  multivitamin (RENA-VIT) TABS tablet, Take 1 tablet by mouth at bedtime., Disp: 30 tablet, Rfl: 3 .  mupirocin ointment (BACTROBAN) 2 %, Apply 1 application topically daily as needed (skin bumps). , Disp: , Rfl:  .  NYSTATIN powder, Apply 1 Bottle topically 2 (two) times daily as needed (dryness). , Disp: , Rfl: 2 .  oxyCODONE-acetaminophen (PERCOCET/ROXICET) 5-325 MG tablet, Take 1 tablet by mouth every 6 (six) hours as needed., Disp: 12 tablet, Rfl: 0 .  pramipexole (MIRAPEX) 0.125 MG tablet, Take 0.125 mg by mouth 3 (three) times daily., Disp: , Rfl:    Allergies  Allergen Reactions  . Gadolinium Derivatives Hives and Other (See Comments)    HIVES, Desc: HIVES W/ "DYE" USED FOR 1ST CT SCAN BUT NOT 2ND, NO PREMEDS USED, PT UNCERTAIN OF CIRCUMSTANCES,,?POSSIBLE MRI  CONTRAST ALLERGY, ALL STUDIES DONE "SOMEWHERE" IN [place]//A.C., Onset Date: [id number]  . Iohexol Other (See Comments)     Code: HIVES, Desc: HIVES W/ "DYE" USED FOR 1ST CT SCAN BUT NOT 2ND, NO PREMEDS USED, PT UNCERTAIN OF CIRCUMSTANCES,,?POSSIBLE MRI CONTRAST ALLERGY, ALL STUDIES DONE "SOMEWHERE" IN [place]//A.C., Onset Date: [id number]   . Iodine Hives     Review of Systems  Constitutional: Negative.   HENT: Negative.   Respiratory: Negative.   Cardiovascular: Negative.   Genitourinary: Negative.   Musculoskeletal: Negative.   Neurological: Negative.      Today's Vitals   10/29/18 1131  BP: (!) 150/72  Pulse: 90  Temp: 98.2 F (36.8 C)  TempSrc: Oral  SpO2: 90%  Weight: 195 lb (88.5 kg)  Height: 5\' 1"  (1.549 m)  PainSc: 10-Worst pain ever  PainLoc: Rib Cage   Body mass index is 36.84 kg/m.   Objective:  Physical Exam  Constitutional: She appears well-developed and well-nourished.  Cardiovascular: Normal rate, regular rhythm and normal heart sounds.  Pulmonary/Chest: Effort normal and breath sounds normal. She exhibits tenderness. Left breast exhibits tenderness.    Skin: Skin is warm and dry.  Psychiatric: She has a normal mood and affect.        Assessment And Plan:      1. Closed fracture of multiple ribs of left side with routine healing, subsequent encounter  Will repeat rib xray week of November 18th  Encouraged to take tylenol extra strength 2-3 hours before her dialysis.  Also encouraged to use a heating pad and apply a pain patch to the area.   - DG Ribs Unilateral Left; Future     2. Essential hypertension  Chronic, poorly controlled,  Elevated today however likely related to pain and did not have dialysis today.   No current medications    [name], FNP

## 2018-10-30 DIAGNOSIS — I129 Hypertensive chronic kidney disease with stage 1 through stage 4 chronic kidney disease, or unspecified chronic kidney disease: Secondary | ICD-10-CM | POA: Diagnosis not present

## 2018-10-30 DIAGNOSIS — N186 End stage renal disease: Secondary | ICD-10-CM | POA: Diagnosis not present

## 2018-10-30 DIAGNOSIS — N2581 Secondary hyperparathyroidism of renal origin: Secondary | ICD-10-CM | POA: Diagnosis not present

## 2018-10-30 DIAGNOSIS — D509 Iron deficiency anemia, unspecified: Secondary | ICD-10-CM | POA: Diagnosis not present

## 2018-10-30 DIAGNOSIS — Z992 Dependence on renal dialysis: Secondary | ICD-10-CM | POA: Diagnosis not present

## 2018-11-02 DIAGNOSIS — N186 End stage renal disease: Secondary | ICD-10-CM | POA: Diagnosis not present

## 2018-11-02 DIAGNOSIS — N2581 Secondary hyperparathyroidism of renal origin: Secondary | ICD-10-CM | POA: Diagnosis not present

## 2018-11-02 DIAGNOSIS — D509 Iron deficiency anemia, unspecified: Secondary | ICD-10-CM | POA: Diagnosis not present

## 2018-11-04 DIAGNOSIS — N186 End stage renal disease: Secondary | ICD-10-CM | POA: Diagnosis not present

## 2018-11-04 DIAGNOSIS — N2581 Secondary hyperparathyroidism of renal origin: Secondary | ICD-10-CM | POA: Diagnosis not present

## 2018-11-04 DIAGNOSIS — D509 Iron deficiency anemia, unspecified: Secondary | ICD-10-CM | POA: Diagnosis not present

## 2018-11-06 DIAGNOSIS — N186 End stage renal disease: Secondary | ICD-10-CM | POA: Diagnosis not present

## 2018-11-06 DIAGNOSIS — N2581 Secondary hyperparathyroidism of renal origin: Secondary | ICD-10-CM | POA: Diagnosis not present

## 2018-11-06 DIAGNOSIS — D509 Iron deficiency anemia, unspecified: Secondary | ICD-10-CM | POA: Diagnosis not present

## 2018-11-09 DIAGNOSIS — D509 Iron deficiency anemia, unspecified: Secondary | ICD-10-CM | POA: Diagnosis not present

## 2018-11-09 DIAGNOSIS — N186 End stage renal disease: Secondary | ICD-10-CM | POA: Diagnosis not present

## 2018-11-09 DIAGNOSIS — N2581 Secondary hyperparathyroidism of renal origin: Secondary | ICD-10-CM | POA: Diagnosis not present

## 2018-11-10 ENCOUNTER — Ambulatory Visit (INDEPENDENT_AMBULATORY_CARE_PROVIDER_SITE_OTHER): Payer: Medicare Other | Admitting: Psychiatry

## 2018-11-10 DIAGNOSIS — F333 Major depressive disorder, recurrent, severe with psychotic symptoms: Secondary | ICD-10-CM

## 2018-11-11 DIAGNOSIS — N186 End stage renal disease: Secondary | ICD-10-CM | POA: Diagnosis not present

## 2018-11-11 DIAGNOSIS — D509 Iron deficiency anemia, unspecified: Secondary | ICD-10-CM | POA: Diagnosis not present

## 2018-11-11 DIAGNOSIS — N2581 Secondary hyperparathyroidism of renal origin: Secondary | ICD-10-CM | POA: Diagnosis not present

## 2018-11-13 DIAGNOSIS — N186 End stage renal disease: Secondary | ICD-10-CM | POA: Diagnosis not present

## 2018-11-13 DIAGNOSIS — N2581 Secondary hyperparathyroidism of renal origin: Secondary | ICD-10-CM | POA: Diagnosis not present

## 2018-11-13 DIAGNOSIS — D509 Iron deficiency anemia, unspecified: Secondary | ICD-10-CM | POA: Diagnosis not present

## 2018-11-16 DIAGNOSIS — N2581 Secondary hyperparathyroidism of renal origin: Secondary | ICD-10-CM | POA: Diagnosis not present

## 2018-11-16 DIAGNOSIS — N186 End stage renal disease: Secondary | ICD-10-CM | POA: Diagnosis not present

## 2018-11-16 DIAGNOSIS — D509 Iron deficiency anemia, unspecified: Secondary | ICD-10-CM | POA: Diagnosis not present

## 2018-11-18 DIAGNOSIS — D509 Iron deficiency anemia, unspecified: Secondary | ICD-10-CM | POA: Diagnosis not present

## 2018-11-18 DIAGNOSIS — N2581 Secondary hyperparathyroidism of renal origin: Secondary | ICD-10-CM | POA: Diagnosis not present

## 2018-11-18 DIAGNOSIS — N186 End stage renal disease: Secondary | ICD-10-CM | POA: Diagnosis not present

## 2018-11-19 ENCOUNTER — Other Ambulatory Visit: Payer: Self-pay | Admitting: Hematology

## 2018-11-20 DIAGNOSIS — N2581 Secondary hyperparathyroidism of renal origin: Secondary | ICD-10-CM | POA: Diagnosis not present

## 2018-11-20 DIAGNOSIS — D509 Iron deficiency anemia, unspecified: Secondary | ICD-10-CM | POA: Diagnosis not present

## 2018-11-20 DIAGNOSIS — N186 End stage renal disease: Secondary | ICD-10-CM | POA: Diagnosis not present

## 2018-11-22 DIAGNOSIS — D509 Iron deficiency anemia, unspecified: Secondary | ICD-10-CM | POA: Diagnosis not present

## 2018-11-22 DIAGNOSIS — N2581 Secondary hyperparathyroidism of renal origin: Secondary | ICD-10-CM | POA: Diagnosis not present

## 2018-11-22 DIAGNOSIS — N186 End stage renal disease: Secondary | ICD-10-CM | POA: Diagnosis not present

## 2018-11-25 ENCOUNTER — Other Ambulatory Visit: Payer: Self-pay

## 2018-11-25 DIAGNOSIS — I82412 Acute embolism and thrombosis of left femoral vein: Secondary | ICD-10-CM

## 2018-11-25 MED ORDER — APIXABAN 2.5 MG PO TABS: 2.5000 mg | ORAL_TABLET | Freq: Two times a day (BID) | ORAL | 1 refills | Status: DC

## 2018-11-27 DIAGNOSIS — D509 Iron deficiency anemia, unspecified: Secondary | ICD-10-CM | POA: Diagnosis not present

## 2018-11-27 DIAGNOSIS — N2581 Secondary hyperparathyroidism of renal origin: Secondary | ICD-10-CM | POA: Diagnosis not present

## 2018-11-27 DIAGNOSIS — N186 End stage renal disease: Secondary | ICD-10-CM | POA: Diagnosis not present

## 2018-11-29 DIAGNOSIS — I129 Hypertensive chronic kidney disease with stage 1 through stage 4 chronic kidney disease, or unspecified chronic kidney disease: Secondary | ICD-10-CM | POA: Diagnosis not present

## 2018-11-29 DIAGNOSIS — N186 End stage renal disease: Secondary | ICD-10-CM | POA: Diagnosis not present

## 2018-11-29 DIAGNOSIS — Z992 Dependence on renal dialysis: Secondary | ICD-10-CM | POA: Diagnosis not present

## 2018-11-30 DIAGNOSIS — D631 Anemia in chronic kidney disease: Secondary | ICD-10-CM | POA: Diagnosis not present

## 2018-11-30 DIAGNOSIS — N2581 Secondary hyperparathyroidism of renal origin: Secondary | ICD-10-CM | POA: Diagnosis not present

## 2018-11-30 DIAGNOSIS — N186 End stage renal disease: Secondary | ICD-10-CM | POA: Diagnosis not present

## 2018-12-02 DIAGNOSIS — D631 Anemia in chronic kidney disease: Secondary | ICD-10-CM | POA: Diagnosis not present

## 2018-12-02 DIAGNOSIS — N2581 Secondary hyperparathyroidism of renal origin: Secondary | ICD-10-CM | POA: Diagnosis not present

## 2018-12-02 DIAGNOSIS — N186 End stage renal disease: Secondary | ICD-10-CM | POA: Diagnosis not present

## 2018-12-03 DIAGNOSIS — R0989 Other specified symptoms and signs involving the circulatory and respiratory systems: Secondary | ICD-10-CM | POA: Diagnosis not present

## 2018-12-03 DIAGNOSIS — R000 Tachycardia, unspecified: Secondary | ICD-10-CM | POA: Diagnosis not present

## 2018-12-03 DIAGNOSIS — R0789 Other chest pain: Secondary | ICD-10-CM | POA: Diagnosis not present

## 2018-12-03 DIAGNOSIS — R0609 Other forms of dyspnea: Secondary | ICD-10-CM | POA: Diagnosis not present

## 2018-12-04 DIAGNOSIS — N2581 Secondary hyperparathyroidism of renal origin: Secondary | ICD-10-CM | POA: Diagnosis not present

## 2018-12-04 DIAGNOSIS — D631 Anemia in chronic kidney disease: Secondary | ICD-10-CM | POA: Diagnosis not present

## 2018-12-04 DIAGNOSIS — N186 End stage renal disease: Secondary | ICD-10-CM | POA: Diagnosis not present

## 2018-12-07 DIAGNOSIS — D631 Anemia in chronic kidney disease: Secondary | ICD-10-CM | POA: Diagnosis not present

## 2018-12-07 DIAGNOSIS — N2581 Secondary hyperparathyroidism of renal origin: Secondary | ICD-10-CM | POA: Diagnosis not present

## 2018-12-07 DIAGNOSIS — N186 End stage renal disease: Secondary | ICD-10-CM | POA: Diagnosis not present

## 2018-12-08 ENCOUNTER — Ambulatory Visit (INDEPENDENT_AMBULATORY_CARE_PROVIDER_SITE_OTHER): Payer: Medicare Other | Admitting: Psychiatry

## 2018-12-08 DIAGNOSIS — F333 Major depressive disorder, recurrent, severe with psychotic symptoms: Secondary | ICD-10-CM | POA: Diagnosis not present

## 2018-12-09 DIAGNOSIS — N2581 Secondary hyperparathyroidism of renal origin: Secondary | ICD-10-CM | POA: Diagnosis not present

## 2018-12-09 DIAGNOSIS — N186 End stage renal disease: Secondary | ICD-10-CM | POA: Diagnosis not present

## 2018-12-09 DIAGNOSIS — D631 Anemia in chronic kidney disease: Secondary | ICD-10-CM | POA: Diagnosis not present

## 2018-12-11 DIAGNOSIS — D631 Anemia in chronic kidney disease: Secondary | ICD-10-CM | POA: Diagnosis not present

## 2018-12-11 DIAGNOSIS — N186 End stage renal disease: Secondary | ICD-10-CM | POA: Diagnosis not present

## 2018-12-11 DIAGNOSIS — N2581 Secondary hyperparathyroidism of renal origin: Secondary | ICD-10-CM | POA: Diagnosis not present

## 2018-12-14 DIAGNOSIS — D631 Anemia in chronic kidney disease: Secondary | ICD-10-CM | POA: Diagnosis not present

## 2018-12-14 DIAGNOSIS — N2581 Secondary hyperparathyroidism of renal origin: Secondary | ICD-10-CM | POA: Diagnosis not present

## 2018-12-14 DIAGNOSIS — N186 End stage renal disease: Secondary | ICD-10-CM | POA: Diagnosis not present

## 2018-12-15 ENCOUNTER — Ambulatory Visit (HOSPITAL_COMMUNITY)
Admission: RE | Admit: 2018-12-15 | Discharge: 2018-12-15 | Disposition: A | Discharge status: Home / Self Care | Payer: Medicare Other | Source: Ambulatory Visit | Attending: Vascular Surgery | Admitting: Vascular Surgery

## 2018-12-15 ENCOUNTER — Encounter: Payer: Self-pay | Admitting: Family

## 2018-12-15 ENCOUNTER — Ambulatory Visit (INDEPENDENT_AMBULATORY_CARE_PROVIDER_SITE_OTHER)
Admission: RE | Admit: 2018-12-15 | Discharge: 2018-12-15 | Disposition: A | Discharge status: Home / Self Care | Payer: Medicare Other | Source: Ambulatory Visit | Attending: Vascular Surgery | Admitting: Vascular Surgery

## 2018-12-15 ENCOUNTER — Ambulatory Visit (INDEPENDENT_AMBULATORY_CARE_PROVIDER_SITE_OTHER): Payer: Medicare Other | Admitting: Family

## 2018-12-15 VITALS — BP 124/87 | HR 80 | Temp 97.0°F | Resp 16 | Ht 61.0 in | Wt 184.0 lb

## 2018-12-15 DIAGNOSIS — I779 Disorder of arteries and arterioles, unspecified: Secondary | ICD-10-CM | POA: Insufficient documentation

## 2018-12-15 DIAGNOSIS — N186 End stage renal disease: Secondary | ICD-10-CM | POA: Diagnosis not present

## 2018-12-15 DIAGNOSIS — Z992 Dependence on renal dialysis: Secondary | ICD-10-CM | POA: Diagnosis not present

## 2018-12-15 NOTE — Progress Notes (Signed)
CC: Follow up peripheral artery occlusive disease, Established Dialysis Access  History of Present Illness  Whitney Walker is a 62 y.o. (05-Oct-1956) female who is s/p Left arm second stage basilic vein fistula transposition on 09-03-18 by Dr. Donzetta Matters for ESRD.  She is also s/p left femoral popliteal vein graft performed August 13, 2013 by Dr. Kellie Simmering. She had ischemia of the left fourth and fifth toes which has healed since her bypass.  She did have a pulmonary embolus November 24, 2013 and started Jennye Moccasin but this has been stopped by her cardiologist as pt states this was causing her headaches.  She is now on Eliquis for PE diagnosed in October 2016; also noted that she has a chronic DVT in the left popliteal vein.   She dialyzes M-W-F via left upper arm AVF. She still has her right IJ TCD since the AVF just started to be used.   She is concerned that she does not need HD as her fluid is low before she dialyzes.    Past Medical History:  Diagnosis Date  . Anemia   . Anxiety   . CHF (congestive heart failure) ([place])   . Colon cancer [facility])    treatment surgery  . Complication of anesthesia    after first C- [place] "couldnt walk after", patient denies having a spinal  . COPD (chronic obstructive pulmonary disease) ([place])   . Coronary artery disease   . Depression   . DVT (deep venous thrombosis) ([place])   . ESRD (end stage renal disease) ([place])    Hemo: MWF  . History of blood transfusion 04/2018  . Hypertension    07/07/18- no longer takes BP medications  . Meningitis   . PE (pulmonary embolism)   . Peripheral vascular disease ([place])   . Restless legs   . Shortness of breath    with exertion  . Sleep apnea   . Vertigo     Social History Social History   Tobacco Use  . Smoking status: Current Some Day Smoker    Packs/day: 1.00    Years: 40.00    Pack years: 40.00    Types: Cigarettes  . Smokeless tobacco: Former Systems developer  . Tobacco comment: less than 1 pack  Substance Use  Topics  . Alcohol use: No    Alcohol/week: 0.0 standard drinks    Comment: she used to drink alcohol, quit in 2010   . Drug use: No    Family History Family History  Problem Relation Age of Onset  . Cancer Mother 43       breast and bone  . Cancer Father 65       prostate  . Hypertension Sister   . Bleeding Disorder Sister   . Cancer Cousin 20       breast cancer   . Hypertension Daughter   . Breast cancer Neg Hx     Surgical History Past Surgical History:  Procedure Laterality Date  . ABDOMINAL AORTAGRAM N/A 07/26/2013   Procedure: ABDOMINAL Maxcine Ham;  Surgeon: [name] Rhodell, MD;  Location[facility] CATH LAB;  Service: Cardiovascular;  Laterality: N/A;  . ABDOMINAL HYSTERECTOMY    . AV FISTULA PLACEMENT Left 05/05/2018   Procedure: ARTERIOVENOUS (AV) FISTULA CREATION BRACHIOCEPHALIC;  Surgeon: Waynetta Sandy, MD;  Location: [place];  Service: Vascular;  Laterality: Left;  . AV FISTULA PLACEMENT Left 07/16/2018   Procedure: ARTERIOVENOUS FISTULA CREATION;  Surgeon: Waynetta Sandy, MD;  Location: [place];  Service: Vascular;  Laterality:  Left;  . BASCILIC VEIN TRANSPOSITION Left 09/03/2018   Procedure: BASILIC VEIN TRANSPOSITION SECOND STAGE;  Surgeon: Waynetta Sandy, MD;  Location: [place];  Service: Vascular;  Laterality: Left;  . BREAST BIOPSY Left   . CESAREAN SECTION     X 3 [id number]  . CHOLECYSTECTOMY    . CHOLECYSTECTOMY  1980/s  . COLECTOMY  2010  . DIVERTICULOSIS SURGERY-2002  2012  . FEMORAL-POPLITEAL BYPASS GRAFT Left 08/13/2013   Procedure: BYPASS GRAFT FEMORAL-POPLITEAL ARTERY WITH NON-REVERSED SAPHANEOUS VEIN; ULTRASOUND GUIDED;  Surgeon: Mal [name], MD;  Location: [place];  Service: Vascular;  Laterality: Left;  . INSERTION OF DIALYSIS CATHETER Right 04/30/2018   Procedure: INSERTION OF Right Internal Jugular DIALYSIS CATHETER;  Surgeon: [name], MD;  Location: [place];  Service: Vascular;  Laterality: Right;  . LOWER EXTREMITY  ANGIOGRAM Left 07/26/2013   Procedure: LOWER EXTREMITY ANGIOGRAM;  Surgeon: [name] Taft, MD;  Location: [facility] CATH LAB;  Service: Cardiovascular;  Laterality: Left;    Allergies  Allergen Reactions  . Gadolinium Derivatives Hives and Other (See Comments)    HIVES, Desc: HIVES W/ "DYE" USED FOR 1ST CT SCAN BUT NOT 2ND, NO PREMEDS USED, PT UNCERTAIN OF CIRCUMSTANCES,,?POSSIBLE MRI CONTRAST ALLERGY, ALL STUDIES DONE "SOMEWHERE" IN [place]//A.C., Onset Date: [id number]  . Iohexol Other (See Comments)     Code: HIVES, Desc: HIVES W/ "DYE" USED FOR 1ST CT SCAN BUT NOT 2ND, NO PREMEDS USED, PT UNCERTAIN OF CIRCUMSTANCES,,?POSSIBLE MRI CONTRAST ALLERGY, ALL STUDIES DONE "SOMEWHERE" IN [place]//A.C., Onset Date: [id number]   . Iodine Hives    Current Outpatient Medications  Medication Sig Dispense Refill  . acetaminophen (TYLENOL) 500 MG tablet Take 1,000 mg by mouth 2 (two) times daily as needed for moderate pain or headache.    . albuterol (PROVENTIL HFA;VENTOLIN HFA) 108 (90 Base) MCG/ACT inhaler Inhale 2 puffs into the lungs every 6 (six) hours as needed for wheezing or shortness of breath.     [name] apixaban (ELIQUIS) 2.5 MG TABS tablet Take 1 tablet (2.5 mg total) by mouth 2 (two) times daily. 60 tablet 1  . Biotin 5000 MCG TABS Take 1 tablet by mouth daily.    . calcitRIOL (ROCALTROL) 0.5 MCG capsule Take 1 capsule (0.5 mcg total) by mouth every other day. (Patient taking differently: Take 1 mcg by mouth 3 (three) times a week. Given at dialysis) 30 capsule 2  . ferric citrate (AURYXIA) 1 GM 210 MG(Fe) tablet Take 210-420 mg by mouth See admin instructions. Take 210-420 mg with each meal (based on size of meal) and 210 mg with each snack     . meclizine (ANTIVERT) 12.5 MG tablet Take 12.5 mg by mouth 3 (three) times daily as needed for dizziness.    . multivitamin (RENA-VIT) TABS tablet Take 1 tablet by mouth at bedtime. 30 tablet 3  . mupirocin ointment (BACTROBAN) 2 % Apply 1 application  topically daily as needed (skin bumps).     . NYSTATIN powder Apply 1 Bottle topically 2 (two) times daily as needed (dryness).   2  . oxyCODONE-acetaminophen (PERCOCET/ROXICET) 5-325 MG tablet Take 1 tablet by mouth every 6 (six) hours as needed. 12 tablet 0  . pramipexole (MIRAPEX) 0.125 MG tablet Take 0.125 mg by mouth 3 (three) times daily.     No current facility-administered medications for this visit.      REVIEW OF SYSTEMS: see HPI for pertinent positives and negatives    PHYSICAL EXAMINATION:  Vitals:   12/15/18 1452  BP: 124/87  Pulse: 80  Resp: 16  Temp: (!) 97 F (36.1 C)  SpO2: 93%  Weight: 184 lb (83.5 kg)  Height: 5\' 1"  (1.549 m)   Body mass index is 34.77 kg/m.  General: A&O x 3, WDWN, obese female.  Gait: normal  Eyes: PERRLA.  Pulmonary: Respirations are non labored, CTAB, without wheezes, rales, or rhonchi.  Cardiac: regular rhythm, +murmur.   Carotid Bruits Left Right   Negative  Negative    Aorta is not palpable.  Radial pulses: are 1 + palpable and =  Palpable thrill at left upper arm AV fistula    VASCULAR EXAM: Extremitieswithout ischemic changes, without gangrene; without open wounds.   LE Pulses  LEFT  RIGHT   POPLITEAL  not palpable  not palpable  POSTERIOR TIBIAL  Not palpable  Not palpable   DORSALIS PEDIS ANTERIOR TIBIAL  2+palpable  2+palpable    Abdomen: soft, NT, no palpable masses.  Skin: no rashes, no ulcers.  Musculoskeletal: no muscle wasting or atrophy.  Neurologic: A&O X 3; Appropriate Affect ; SENSATION: normal;  MOTOR FUNCTION: moving all extremities equally, motor strength 5/5 in arms, 4/5 in legs. Speech is fluent/normal. CN 2-12 intact.   Medical Decision Making  Whitney Walker is a 62 y.o. female whois s/p left femoral popliteal vein graft performed August 13, 2013. She had ischemia of the left fourth and fifth toes which has healed since her bypass.   She does not have claudication symptoms with walking no signs of ischemia in her feet/legs.  Normal ABI's and TBI's with all triphasic waveforms.  Left leg bypass graft is widely patent, no stenosis.    She is also s/p Left arm second stage basilic vein fistula transposition on 09-03-18 by Dr. Donzetta Matters for ESRD.     DATA  12-15-18 Left Graft #1: Fem-Pop +--------------------+--------+--------+---------+--------+            PSV cm/sStenosisWaveform Comments  +--------------------+--------+--------+---------+--------+  Inflow       126       triphasic      +--------------------+--------+--------+---------+--------+  Proximal Anastomosis140       triphasic      +--------------------+--------+--------+---------+--------+  Proximal Graft   235       triphasic      +--------------------+--------+--------+---------+--------+  Mid Graft      106       triphasic      +--------------------+--------+--------+---------+--------+  Distal Graft    128       triphasic      +--------------------+--------+--------+---------+--------+  Distal Anastamosis 130       triphasic      +--------------------+--------+--------+---------+--------+  Outflow       92       triphasic      +--------------------+--------+--------+---------+--------+    Summary:    Left: Widely patent fem-pop BPG without evidence of stenosis. Elevated velocities are a result of proximal BPG wall irregularities.   12-15-18 ABI Findings:  +---------+------------------+-----+---------+--------+  Right  Rt Pressure (mmHg)IndexWaveform Comment   +---------+------------------+-----+---------+--------+  Brachial 142           triphasic      +---------+------------------+-----+---------+--------+  PTA   159        1.12 triphasic       +---------+------------------+-----+---------+--------+  DP    155        1.09 triphasic      +---------+------------------+-----+---------+--------+  Great Toe131        0.92 Normal        +---------+------------------+-----+---------+--------+    +---------+------------------+-----+---------+-------------------+  Left   Lt Pressure (mmHg)IndexWaveform Comment        +---------+------------------+-----+---------+-------------------+  Brachial                  Hemodialysis Access  +---------+------------------+-----+---------+-------------------+  PTA   165        1.16 triphasic            +---------+------------------+-----+---------+-------------------+  DP    173        1.22 triphasic            +---------+------------------+-----+---------+-------------------+  Great Toe125        0.88 Normal              +---------+------------------+-----+---------+-------------------+    +-------+-----------+-----------+------------+------------+  ABI/TBIToday's ABIToday's TBIPrevious ABIPrevious TBI  +-------+-----------+-----------+------------+------------+  Right 1.12    0.92    1.08    0.75      +-------+-----------+-----------+------------+------------+  Left  1.22    0.88    1.07    0.79      +-------+-----------+-----------+------------+------------+    Bilateral ABIs and TBIs appear essentially unchanged compared to prior study on 09/05/2017.    Summary:  Right: Resting right ankle-brachial index is within normal range. No evidence of significant right lower extremity arterial disease. The right toe-brachial index is normal. RT great toe pressure = 131 mmHg.    Left: Resting left ankle-brachial index is within normal range. No evidence of significant left lower extremity arterial disease.  The left toe-brachial index is normal. LT Great toe pressure = 125 mmHg.      PLAN:   Based on the patient's vascular studies and examination, pt will return to clinic in 1 year with ABI's and left LE arterial duplex.   I advised her to notify us if she develops concerns re the circulation in her feet or legs.   Walk at least 30 minutes daily total.    Clemon Chambers, RN, MSN, FNP-C [facility] Office: [phone]  12/15/2018, 3:12 PM  Clinic MD: Carlis Abbott

## 2018-12-15 NOTE — Patient Instructions (Signed)

## 2018-12-16 DIAGNOSIS — N2581 Secondary hyperparathyroidism of renal origin: Secondary | ICD-10-CM | POA: Diagnosis not present

## 2018-12-16 DIAGNOSIS — N186 End stage renal disease: Secondary | ICD-10-CM | POA: Diagnosis not present

## 2018-12-16 DIAGNOSIS — D631 Anemia in chronic kidney disease: Secondary | ICD-10-CM | POA: Diagnosis not present

## 2018-12-18 DIAGNOSIS — N186 End stage renal disease: Secondary | ICD-10-CM | POA: Diagnosis not present

## 2018-12-18 DIAGNOSIS — N2581 Secondary hyperparathyroidism of renal origin: Secondary | ICD-10-CM | POA: Diagnosis not present

## 2018-12-18 DIAGNOSIS — D631 Anemia in chronic kidney disease: Secondary | ICD-10-CM | POA: Diagnosis not present

## 2018-12-20 DIAGNOSIS — N186 End stage renal disease: Secondary | ICD-10-CM | POA: Diagnosis not present

## 2018-12-20 DIAGNOSIS — N2581 Secondary hyperparathyroidism of renal origin: Secondary | ICD-10-CM | POA: Diagnosis not present

## 2018-12-20 DIAGNOSIS — D631 Anemia in chronic kidney disease: Secondary | ICD-10-CM | POA: Diagnosis not present

## 2018-12-22 DIAGNOSIS — N2581 Secondary hyperparathyroidism of renal origin: Secondary | ICD-10-CM | POA: Diagnosis not present

## 2018-12-22 DIAGNOSIS — N186 End stage renal disease: Secondary | ICD-10-CM | POA: Diagnosis not present

## 2018-12-22 DIAGNOSIS — D631 Anemia in chronic kidney disease: Secondary | ICD-10-CM | POA: Diagnosis not present

## 2018-12-25 DIAGNOSIS — N186 End stage renal disease: Secondary | ICD-10-CM | POA: Diagnosis not present

## 2018-12-25 DIAGNOSIS — D631 Anemia in chronic kidney disease: Secondary | ICD-10-CM | POA: Diagnosis not present

## 2018-12-25 DIAGNOSIS — N2581 Secondary hyperparathyroidism of renal origin: Secondary | ICD-10-CM | POA: Diagnosis not present

## 2018-12-27 DIAGNOSIS — N2581 Secondary hyperparathyroidism of renal origin: Secondary | ICD-10-CM | POA: Diagnosis not present

## 2018-12-27 DIAGNOSIS — N186 End stage renal disease: Secondary | ICD-10-CM | POA: Diagnosis not present

## 2018-12-27 DIAGNOSIS — D631 Anemia in chronic kidney disease: Secondary | ICD-10-CM | POA: Diagnosis not present

## 2018-12-30 DIAGNOSIS — Z992 Dependence on renal dialysis: Secondary | ICD-10-CM | POA: Diagnosis not present

## 2018-12-30 DIAGNOSIS — N186 End stage renal disease: Secondary | ICD-10-CM | POA: Diagnosis not present

## 2018-12-30 DIAGNOSIS — I129 Hypertensive chronic kidney disease with stage 1 through stage 4 chronic kidney disease, or unspecified chronic kidney disease: Secondary | ICD-10-CM | POA: Diagnosis not present

## 2018-12-31 DIAGNOSIS — N186 End stage renal disease: Secondary | ICD-10-CM | POA: Diagnosis not present

## 2018-12-31 DIAGNOSIS — D631 Anemia in chronic kidney disease: Secondary | ICD-10-CM | POA: Diagnosis not present

## 2018-12-31 DIAGNOSIS — N2581 Secondary hyperparathyroidism of renal origin: Secondary | ICD-10-CM | POA: Diagnosis not present

## 2019-01-04 DIAGNOSIS — D631 Anemia in chronic kidney disease: Secondary | ICD-10-CM | POA: Diagnosis not present

## 2019-01-04 DIAGNOSIS — N2581 Secondary hyperparathyroidism of renal origin: Secondary | ICD-10-CM | POA: Diagnosis not present

## 2019-01-04 DIAGNOSIS — N186 End stage renal disease: Secondary | ICD-10-CM | POA: Diagnosis not present

## 2019-01-06 DIAGNOSIS — N186 End stage renal disease: Secondary | ICD-10-CM | POA: Diagnosis not present

## 2019-01-06 DIAGNOSIS — D631 Anemia in chronic kidney disease: Secondary | ICD-10-CM | POA: Diagnosis not present

## 2019-01-06 DIAGNOSIS — N2581 Secondary hyperparathyroidism of renal origin: Secondary | ICD-10-CM | POA: Diagnosis not present

## 2019-01-07 ENCOUNTER — Ambulatory Visit: Payer: Medicare Other | Admitting: Psychiatry

## 2019-01-08 DIAGNOSIS — D631 Anemia in chronic kidney disease: Secondary | ICD-10-CM | POA: Diagnosis not present

## 2019-01-08 DIAGNOSIS — N186 End stage renal disease: Secondary | ICD-10-CM | POA: Diagnosis not present

## 2019-01-08 DIAGNOSIS — N2581 Secondary hyperparathyroidism of renal origin: Secondary | ICD-10-CM | POA: Diagnosis not present

## 2019-01-11 DIAGNOSIS — N2581 Secondary hyperparathyroidism of renal origin: Secondary | ICD-10-CM | POA: Diagnosis not present

## 2019-01-11 DIAGNOSIS — D631 Anemia in chronic kidney disease: Secondary | ICD-10-CM | POA: Diagnosis not present

## 2019-01-11 DIAGNOSIS — N186 End stage renal disease: Secondary | ICD-10-CM | POA: Diagnosis not present

## 2019-01-13 DIAGNOSIS — N2581 Secondary hyperparathyroidism of renal origin: Secondary | ICD-10-CM | POA: Diagnosis not present

## 2019-01-13 DIAGNOSIS — N186 End stage renal disease: Secondary | ICD-10-CM | POA: Diagnosis not present

## 2019-01-13 DIAGNOSIS — D631 Anemia in chronic kidney disease: Secondary | ICD-10-CM | POA: Diagnosis not present

## 2019-01-15 DIAGNOSIS — N186 End stage renal disease: Secondary | ICD-10-CM | POA: Diagnosis not present

## 2019-01-15 DIAGNOSIS — D631 Anemia in chronic kidney disease: Secondary | ICD-10-CM | POA: Diagnosis not present

## 2019-01-15 DIAGNOSIS — N2581 Secondary hyperparathyroidism of renal origin: Secondary | ICD-10-CM | POA: Diagnosis not present

## 2019-01-18 DIAGNOSIS — N2581 Secondary hyperparathyroidism of renal origin: Secondary | ICD-10-CM | POA: Diagnosis not present

## 2019-01-18 DIAGNOSIS — D631 Anemia in chronic kidney disease: Secondary | ICD-10-CM | POA: Diagnosis not present

## 2019-01-18 DIAGNOSIS — N186 End stage renal disease: Secondary | ICD-10-CM | POA: Diagnosis not present

## 2019-01-20 DIAGNOSIS — N2581 Secondary hyperparathyroidism of renal origin: Secondary | ICD-10-CM | POA: Diagnosis not present

## 2019-01-20 DIAGNOSIS — D631 Anemia in chronic kidney disease: Secondary | ICD-10-CM | POA: Diagnosis not present

## 2019-01-20 DIAGNOSIS — N186 End stage renal disease: Secondary | ICD-10-CM | POA: Diagnosis not present

## 2019-01-22 DIAGNOSIS — D631 Anemia in chronic kidney disease: Secondary | ICD-10-CM | POA: Diagnosis not present

## 2019-01-22 DIAGNOSIS — N186 End stage renal disease: Secondary | ICD-10-CM | POA: Diagnosis not present

## 2019-01-22 DIAGNOSIS — N2581 Secondary hyperparathyroidism of renal origin: Secondary | ICD-10-CM | POA: Diagnosis not present

## 2019-01-25 DIAGNOSIS — N2581 Secondary hyperparathyroidism of renal origin: Secondary | ICD-10-CM | POA: Diagnosis not present

## 2019-01-25 DIAGNOSIS — D631 Anemia in chronic kidney disease: Secondary | ICD-10-CM | POA: Diagnosis not present

## 2019-01-25 DIAGNOSIS — N186 End stage renal disease: Secondary | ICD-10-CM | POA: Diagnosis not present

## 2019-01-27 DIAGNOSIS — D631 Anemia in chronic kidney disease: Secondary | ICD-10-CM | POA: Diagnosis not present

## 2019-01-27 DIAGNOSIS — N2581 Secondary hyperparathyroidism of renal origin: Secondary | ICD-10-CM | POA: Diagnosis not present

## 2019-01-27 DIAGNOSIS — N186 End stage renal disease: Secondary | ICD-10-CM | POA: Diagnosis not present

## 2019-01-29 DIAGNOSIS — N186 End stage renal disease: Secondary | ICD-10-CM | POA: Diagnosis not present

## 2019-01-29 DIAGNOSIS — N2581 Secondary hyperparathyroidism of renal origin: Secondary | ICD-10-CM | POA: Diagnosis not present

## 2019-01-29 DIAGNOSIS — D631 Anemia in chronic kidney disease: Secondary | ICD-10-CM | POA: Diagnosis not present

## 2019-01-30 DIAGNOSIS — N186 End stage renal disease: Secondary | ICD-10-CM | POA: Diagnosis not present

## 2019-01-30 DIAGNOSIS — Z992 Dependence on renal dialysis: Secondary | ICD-10-CM | POA: Diagnosis not present

## 2019-01-30 DIAGNOSIS — I129 Hypertensive chronic kidney disease with stage 1 through stage 4 chronic kidney disease, or unspecified chronic kidney disease: Secondary | ICD-10-CM | POA: Diagnosis not present

## 2019-01-31 ENCOUNTER — Other Ambulatory Visit: Payer: Self-pay | Admitting: Hematology

## 2019-01-31 DIAGNOSIS — I82412 Acute embolism and thrombosis of left femoral vein: Secondary | ICD-10-CM

## 2019-02-01 DIAGNOSIS — E8770 Fluid overload, unspecified: Secondary | ICD-10-CM | POA: Diagnosis not present

## 2019-02-01 DIAGNOSIS — D631 Anemia in chronic kidney disease: Secondary | ICD-10-CM | POA: Diagnosis not present

## 2019-02-01 DIAGNOSIS — N2581 Secondary hyperparathyroidism of renal origin: Secondary | ICD-10-CM | POA: Diagnosis not present

## 2019-02-01 DIAGNOSIS — N186 End stage renal disease: Secondary | ICD-10-CM | POA: Diagnosis not present

## 2019-02-01 DIAGNOSIS — E875 Hyperkalemia: Secondary | ICD-10-CM | POA: Diagnosis not present

## 2019-02-02 ENCOUNTER — Ambulatory Visit: Payer: Medicare Other | Admitting: Psychiatry

## 2019-02-03 DIAGNOSIS — N186 End stage renal disease: Secondary | ICD-10-CM | POA: Diagnosis not present

## 2019-02-03 DIAGNOSIS — N2581 Secondary hyperparathyroidism of renal origin: Secondary | ICD-10-CM | POA: Diagnosis not present

## 2019-02-03 DIAGNOSIS — E875 Hyperkalemia: Secondary | ICD-10-CM | POA: Diagnosis not present

## 2019-02-03 DIAGNOSIS — E8770 Fluid overload, unspecified: Secondary | ICD-10-CM | POA: Diagnosis not present

## 2019-02-03 DIAGNOSIS — D631 Anemia in chronic kidney disease: Secondary | ICD-10-CM | POA: Diagnosis not present

## 2019-02-05 ENCOUNTER — Telehealth: Payer: Self-pay | Admitting: Hematology

## 2019-02-05 DIAGNOSIS — E8770 Fluid overload, unspecified: Secondary | ICD-10-CM | POA: Diagnosis not present

## 2019-02-05 DIAGNOSIS — E875 Hyperkalemia: Secondary | ICD-10-CM | POA: Diagnosis not present

## 2019-02-05 DIAGNOSIS — N186 End stage renal disease: Secondary | ICD-10-CM | POA: Diagnosis not present

## 2019-02-05 DIAGNOSIS — N2581 Secondary hyperparathyroidism of renal origin: Secondary | ICD-10-CM | POA: Diagnosis not present

## 2019-02-05 DIAGNOSIS — D631 Anemia in chronic kidney disease: Secondary | ICD-10-CM | POA: Diagnosis not present

## 2019-02-05 NOTE — Telephone Encounter (Signed)
Called patient per scheduling voicemail to reschedule missed appt.  Patient aware of new appt date and time.

## 2019-02-09 ENCOUNTER — Ambulatory Visit: Payer: Self-pay | Admitting: Cardiology

## 2019-02-10 DIAGNOSIS — E8770 Fluid overload, unspecified: Secondary | ICD-10-CM | POA: Diagnosis not present

## 2019-02-10 DIAGNOSIS — D631 Anemia in chronic kidney disease: Secondary | ICD-10-CM | POA: Diagnosis not present

## 2019-02-10 DIAGNOSIS — E875 Hyperkalemia: Secondary | ICD-10-CM | POA: Diagnosis not present

## 2019-02-10 DIAGNOSIS — N2581 Secondary hyperparathyroidism of renal origin: Secondary | ICD-10-CM | POA: Diagnosis not present

## 2019-02-10 DIAGNOSIS — N186 End stage renal disease: Secondary | ICD-10-CM | POA: Diagnosis not present

## 2019-02-11 NOTE — Progress Notes (Signed)
[place]   [phone] [phone]   Clinic Follow up Note   Patient Care Team: Glendale Chard, MD as PCP - General (Internal Medicine) Waynetta Sandy, MD as Consulting Physician (Vascular Surgery) Center, [facility] as Referring Physician (Nephrology) 02/16/2019  CHIEF COMPLAINT: F/u anemia, history of thrombosis   CURRENT THERAPY   Eliquis 2.5mg  bid (dose per renal function)  INTERVAL HISTORY: Whitney Walker is a 63 y.o. female who is here for follow-up. She is s/p left arm second stage basilic vein fistula 3/0/94 by Dr. Donzetta Matters. Today, she is here alone. She has a lot of frustration when she goes to the dialysis center. She feels like her dialysis doctor and the dialysis center are not treating her properly. Every time she gets needle sticked at the center her arm becomes severely bruised. She no longer wants to see her dialysis doctor, Dr. Joelyn Oms. Her left arm today still has multiple bruises from her dialysis treatment last week. She sees a therapist but does not receive any medication from her. She complains of worsening restless leg syndrome in her left leg. She has to cut her dialysis short because she cannot stay still. Additionally, she has noticed some memory loss, and frequently takes notes to remember.   Pertinent positives and negatives of review of systems are listed and detailed within the above HPI.  REVIEW OF SYSTEMS:   Constitutional: Denies fevers, chills or abnormal weight loss, (+) mild memory loss  Eyes: Denies blurriness of vision Ears, nose, mouth, throat, and face: Denies mucositis or sore throat Respiratory: Denies cough, dyspnea or wheezes Cardiovascular: Denies palpitation, chest discomfort or lower extremity swelling Gastrointestinal:  Denies nausea, heartburn or change in bowel habits Skin: Denies abnormal skin rashes, (+) left arm bruising  Lymphatics: Denies new lymphadenopathy or easy  bruising Neurological:Denies numbness, tingling or new weaknesses, (+) left restless leg syndrome  Behavioral/Psych: Mood is stable, no new changes  All other systems were reviewed with the patient and are negative.  MEDICAL HISTORY:  Past Medical History:  Diagnosis Date  . Anemia   . Anxiety   . CHF (congestive heart failure) ([place])   . Colon cancer [facility])    treatment surgery  . Complication of anesthesia    after first [place]- [place] "couldnt walk after", patient denies having a spinal  . COPD (chronic obstructive pulmonary disease) ([place])   . Coronary artery disease   . Depression   . DVT (deep venous thrombosis) ([place])   . ESRD (end stage renal disease) ([place])    Hemo: MWF  . History of blood transfusion 04/2018  . Hypertension    07/07/18- no longer takes BP medications  . Meningitis   . PE (pulmonary embolism)   . Peripheral vascular disease ([place])   . Restless legs   . Shortness of breath    with exertion  . Sleep apnea   . Vertigo     SURGICAL HISTORY: Past Surgical History:  Procedure Laterality Date  . ABDOMINAL AORTAGRAM N/A 07/26/2013   Procedure: ABDOMINAL Maxcine Ham;  Surgeon: [name] [place], MD;  Location: [facility] CATH LAB;  Service: Cardiovascular;  Laterality: N/A;  . ABDOMINAL HYSTERECTOMY    . AV FISTULA PLACEMENT Left 05/05/2018   Procedure: ARTERIOVENOUS (AV) FISTULA CREATION BRACHIOCEPHALIC;  Surgeon: Waynetta Sandy, MD;  Location: [place];  Service: Vascular;  Laterality: Left;  . AV FISTULA PLACEMENT Left 07/16/2018   Procedure: ARTERIOVENOUS FISTULA CREATION;  Surgeon: Waynetta Sandy, MD;  Location: [facility]  OR;  Service: Vascular;  Laterality: Left;  . BASCILIC VEIN TRANSPOSITION Left 09/03/2018   Procedure: BASILIC VEIN TRANSPOSITION SECOND STAGE;  Surgeon: Waynetta Sandy, MD;  Location: [employer];  Service: Vascular;  Laterality: Left;  . BREAST BIOPSY Left   . CESAREAN SECTION     X 3 [id number]  . CHOLECYSTECTOMY    . CHOLECYSTECTOMY  1980/s   . COLECTOMY  2010  . DIVERTICULOSIS SURGERY-2002  2012  . FEMORAL-POPLITEAL BYPASS GRAFT Left 08/13/2013   Procedure: BYPASS GRAFT FEMORAL-POPLITEAL ARTERY WITH NON-REVERSED SAPHANEOUS VEIN; ULTRASOUND GUIDED;  Surgeon: Mal [name], MD;  Location: [place];  Service: Vascular;  Laterality: Left;  . INSERTION OF DIALYSIS CATHETER Right 04/30/2018   Procedure: INSERTION OF Right Internal Jugular DIALYSIS CATHETER;  Surgeon: [name], MD;  Location: [place];  Service: Vascular;  Laterality: Right;  . LOWER EXTREMITY ANGIOGRAM Left 07/26/2013   Procedure: LOWER EXTREMITY ANGIOGRAM;  Surgeon: [name] [place], MD;  Location: [facility] CATH LAB;  Service: Cardiovascular;  Laterality: Left;    I have reviewed the social history and family history with the patient and they are unchanged from previous note.  ALLERGIES:  is allergic to gadolinium derivatives; iohexol; and iodine.  MEDICATIONS:  Current Outpatient Medications  Medication Sig Dispense Refill  . acetaminophen (TYLENOL) 500 MG tablet Take 1,000 mg by mouth 2 (two) times daily as needed for moderate pain or headache.    . albuterol (PROVENTIL HFA;VENTOLIN HFA) 108 (90 Base) MCG/ACT inhaler Inhale 2 puffs into the lungs every 6 (six) hours as needed for wheezing or shortness of breath.     . Biotin 5000 MCG TABS Take 1 tablet by mouth daily.    . calcitRIOL (ROCALTROL) 0.5 MCG capsule Take 1 capsule (0.5 mcg total) by mouth every other day. (Patient taking differently: Take 1 mcg by mouth 3 (three) times a week. Given at dialysis) 30 capsule 2  . ELIQUIS 2.5 MG TABS tablet TAKE 1 TABLET(2.5 MG) BY MOUTH TWICE DAILY 60 tablet 1  . ferric citrate (AURYXIA) 1 GM 210 MG(Fe) tablet Take 210-420 mg by mouth See admin instructions. Take 210-420 mg with each meal (based on size of meal) and 210 mg with each snack     . meclizine (ANTIVERT) 12.5 MG tablet Take 12.5 mg by mouth 3 (three) times daily as needed for dizziness.    . mupirocin ointment  (BACTROBAN) 2 % Apply 1 application topically daily as needed (skin bumps).     . NYSTATIN powder Apply 1 Bottle topically 2 (two) times daily as needed (dryness).   2  . oxyCODONE-acetaminophen (PERCOCET/ROXICET) 5-325 MG tablet Take 1 tablet by mouth every 6 (six) hours as needed. (Patient not taking: Reported on 02/16/2019) 12 tablet 0  . pramipexole (MIRAPEX) 0.125 MG tablet Take 0.125 mg by mouth 3 (three) times daily.     No current facility-administered medications for this visit.     PHYSICAL EXAMINATION: ECOG PERFORMANCE STATUS: 1 - Symptomatic but completely ambulatory  Vitals:   02/16/19 1346  BP: (!) 160/70  Pulse: 73  Resp: 19  Temp: 98.5 F (36.9 C)  SpO2: 100%   Filed Weights   02/16/19 1346  Weight: 187 lb 3.2 oz (84.9 kg)    GENERAL:alert, no distress and comfortable SKIN: skin color, texture, turgor are normal, no rashes or significant lesions, (+) bruising on left biceps  EYES: normal, Conjunctiva are pink and non-injected, sclera clear OROPHARYNX:no exudate, no erythema and lips, buccal mucosa, and tongue  normal  NECK: supple, thyroid normal size, non-tender, without nodularity LYMPH:  no palpable lymphadenopathy in the cervical, axillary or inguinal LUNGS: clear to auscultation and percussion with normal breathing effort HEART: regular rate & rhythm and no murmurs and no lower extremity edema ABDOMEN:abdomen soft, non-tender and normal bowel sounds Musculoskeletal:no cyanosis of digits and no clubbing  NEURO: alert & oriented x 3 with fluent speech, no focal motor/sensory deficits  LABORATORY DATA:  I have reviewed the data as listed CBC Latest Ref Rng & Units 02/16/2019 09/03/2018 07/16/2018  WBC 4.0 - 10.5 K/uL 10.6(H) - -  Hemoglobin 12.0 - 15.0 g/dL 11.1(L) 15.0 12.9  Hematocrit 36.0 - 46.0 % 36.3 44.0 38.0  Platelets 150 - 400 K/uL 154 - -     CMP Latest Ref Rng & Units 02/16/2019 09/03/2018 07/16/2018  Glucose 70 - 99 mg/dL 111(H) 100(H) 99  BUN 8 -  23 mg/dL 27(H) - -  Creatinine 0.44 - 1.00 mg/dL 6.45(HH) - -  Sodium 135 - 145 mmol/L 137 137 139  Potassium 3.5 - 5.1 mmol/L 4.6 5.2(H) 4.6  Chloride 98 - 111 mmol/L 103 - -  CO2 22 - 32 mmol/L 24 - -  Calcium 8.9 - 10.3 mg/dL 9.9 - -  Total Protein 6.5 - 8.1 g/dL 7.8 - -  Total Bilirubin 0.3 - 1.2 mg/dL <0.2(L) - -  Alkaline Phos 38 - 126 U/L 91 - -  AST 15 - 41 U/L 22 - -  ALT 0 - 44 U/L 18 - -    Surgeries: 05/04/3148  Basilic Vein Transposition Second Stage by Dr. Donzetta Matters    RADIOGRAPHIC STUDIES: I have personally reviewed the radiological images as listed and agreed with the findings in the report.  08/11/2018 BILATERAL MAMMOGRAM WITH TOMO AND CAD IMPRESSION: No mammographic evidence of malignancy. A result letter of this screening mammogram will be mailed directly to the patient.  ASSESSMENT & PLAN:  Emaley Applin is a 63 y.o. female with history of  1. Recurrent DVT and PE -She previously had episodes of PE, was treated with Xarelto. 10 months after she came off Xarelto, she developed unprovoked DVT and PE. -I previously recommended her to continue anticoagulation indefinitely, if no contraindications. -Patient was previously on Coumadin, but switched to Eliquis due to her transportation issues with Coumadin check up. - She takes Eliquis 2.5mg  BID, dose adjusted based on her ESRD on HD. -She underwent surgery for her fistula on 05/05/18 and started dialysis around the same time.  - She is s/p left arm second stage basilic vein fistula 7/0/26 by Dr. Donzetta Matters, but still has issues with the fistula with significant bruising when used, partially related to her Eliquis. She is currently using a right [place] catheter for dialysis now  -she states she has been giving high dose hepatin during dialysis due to clotting issue, which is fine.  -continue Eliquis    2. Mild leukocytosis, with predominant neutrophils -Her total white count previously was initially 13.5K, with neutrophil  10.3K. Her mild leukocytosis has been stable over the past 3 years, previous workup was negative for BCR/ABL or JAK 2 mutation. This is likely reactive, secondary to her smoking and chronic inflammations (allergy and sinus Infection). No evidence of myeloproliferative neoplasm.  -stable  3. Anemia of chronic disease and anemia of iron deficiency -likely related to her chronic kidney disease  -Her iron study and SPEP were normal in 2013 -She takes oral iron supplement and IV iron as needed which is managed by  her nephrologist at dialysis center  -per pt, she is also receiving EPO as needed during dialysis  - Labs reviewed, Hg 11.1, ferritin high at 1329, serum iron and saturation normal  4. History of colon cancer in 2009  -She has completed 5 years of surveillance.  -colonoscopy in 05/2012 -last coloscopy done by Dr. Collene Mares within the last 5 years according to patient.   5. CKD, stage V, on HD -She underwent fistula surgery with Dr. Donzetta Matters on 05/05/18 and she also started dialysis in 04/2018.  -she has a lot of concerns about her care at dialysis center, I offered listening. She would like to see Dr. [name] again (she is currently under a different nephrologist's care), I encourage her to call and request the appointment   6. OSA, PVD, CHF -F/u with PCP and other specialists. -stable   7. Restless leg syndrome -f/u with PCP  -Her Mirapex dose was decreased to once at night by her Dialysis NP.   8. Anxiety and depression   - She is being followed by a Dr. Ouida Sills for counseling.   Plan - I will contact Dr. [name] regarding her request to see Dr. [name] again and discuss her concerns about HD  -Labs reviewed and adequate to continue Eliquis 2.5 mg BID  -Lab and f/u in 9 months     No problem-specific Assessment & Plan notes found for this encounter.   No orders of the defined types were placed in this encounter.  All questions were answered. The patient knows to call the clinic with  any problems, questions or concerns. No barriers to learning was detected. I spent 25 minutes counseling the patient face to face. The total time spent in the appointment was 30 minutes and more than 50% was on counseling and review of test results  I, Manson Allan am acting as scribe for Dr. Truitt Merle.  I have reviewed the above documentation for accuracy and completeness, and I agree with the above.     Truitt Merle, MD 02/16/2019

## 2019-02-12 DIAGNOSIS — N186 End stage renal disease: Secondary | ICD-10-CM | POA: Diagnosis not present

## 2019-02-12 DIAGNOSIS — N2581 Secondary hyperparathyroidism of renal origin: Secondary | ICD-10-CM | POA: Diagnosis not present

## 2019-02-12 DIAGNOSIS — E8770 Fluid overload, unspecified: Secondary | ICD-10-CM | POA: Diagnosis not present

## 2019-02-12 DIAGNOSIS — D631 Anemia in chronic kidney disease: Secondary | ICD-10-CM | POA: Diagnosis not present

## 2019-02-12 DIAGNOSIS — E875 Hyperkalemia: Secondary | ICD-10-CM | POA: Diagnosis not present

## 2019-02-13 DIAGNOSIS — N2581 Secondary hyperparathyroidism of renal origin: Secondary | ICD-10-CM | POA: Diagnosis not present

## 2019-02-13 DIAGNOSIS — D631 Anemia in chronic kidney disease: Secondary | ICD-10-CM | POA: Diagnosis not present

## 2019-02-13 DIAGNOSIS — N186 End stage renal disease: Secondary | ICD-10-CM | POA: Diagnosis not present

## 2019-02-13 DIAGNOSIS — E8770 Fluid overload, unspecified: Secondary | ICD-10-CM | POA: Diagnosis not present

## 2019-02-13 DIAGNOSIS — E875 Hyperkalemia: Secondary | ICD-10-CM | POA: Diagnosis not present

## 2019-02-15 DIAGNOSIS — E8770 Fluid overload, unspecified: Secondary | ICD-10-CM | POA: Diagnosis not present

## 2019-02-15 DIAGNOSIS — E875 Hyperkalemia: Secondary | ICD-10-CM | POA: Diagnosis not present

## 2019-02-15 DIAGNOSIS — N186 End stage renal disease: Secondary | ICD-10-CM | POA: Diagnosis not present

## 2019-02-15 DIAGNOSIS — N2581 Secondary hyperparathyroidism of renal origin: Secondary | ICD-10-CM | POA: Diagnosis not present

## 2019-02-15 DIAGNOSIS — D631 Anemia in chronic kidney disease: Secondary | ICD-10-CM | POA: Diagnosis not present

## 2019-02-16 ENCOUNTER — Telehealth: Payer: Self-pay | Admitting: Hematology

## 2019-02-16 ENCOUNTER — Encounter: Payer: Self-pay | Admitting: Hematology

## 2019-02-16 ENCOUNTER — Inpatient Hospital Stay: Payer: Medicare Other | Attending: Hematology | Admitting: Hematology

## 2019-02-16 ENCOUNTER — Inpatient Hospital Stay: Payer: Medicare Other

## 2019-02-16 VITALS — BP 160/70 | HR 73 | Temp 98.5°F | Resp 19 | Ht 61.0 in | Wt 187.2 lb

## 2019-02-16 DIAGNOSIS — G2581 Restless legs syndrome: Secondary | ICD-10-CM

## 2019-02-16 DIAGNOSIS — I509 Heart failure, unspecified: Secondary | ICD-10-CM | POA: Insufficient documentation

## 2019-02-16 DIAGNOSIS — Z85038 Personal history of other malignant neoplasm of large intestine: Secondary | ICD-10-CM

## 2019-02-16 DIAGNOSIS — N183 Chronic kidney disease, stage 3 (moderate): Secondary | ICD-10-CM

## 2019-02-16 DIAGNOSIS — D72829 Elevated white blood cell count, unspecified: Secondary | ICD-10-CM | POA: Diagnosis not present

## 2019-02-16 DIAGNOSIS — G4733 Obstructive sleep apnea (adult) (pediatric): Secondary | ICD-10-CM | POA: Diagnosis not present

## 2019-02-16 DIAGNOSIS — I120 Hypertensive chronic kidney disease with stage 5 chronic kidney disease or end stage renal disease: Secondary | ICD-10-CM | POA: Diagnosis not present

## 2019-02-16 DIAGNOSIS — F418 Other specified anxiety disorders: Secondary | ICD-10-CM

## 2019-02-16 DIAGNOSIS — N184 Chronic kidney disease, stage 4 (severe): Secondary | ICD-10-CM

## 2019-02-16 DIAGNOSIS — D631 Anemia in chronic kidney disease: Secondary | ICD-10-CM | POA: Diagnosis not present

## 2019-02-16 DIAGNOSIS — I739 Peripheral vascular disease, unspecified: Secondary | ICD-10-CM | POA: Insufficient documentation

## 2019-02-16 DIAGNOSIS — Z86711 Personal history of pulmonary embolism: Secondary | ICD-10-CM

## 2019-02-16 DIAGNOSIS — Z7901 Long term (current) use of anticoagulants: Secondary | ICD-10-CM | POA: Diagnosis not present

## 2019-02-16 DIAGNOSIS — D509 Iron deficiency anemia, unspecified: Secondary | ICD-10-CM | POA: Diagnosis not present

## 2019-02-16 DIAGNOSIS — I82412 Acute embolism and thrombosis of left femoral vein: Secondary | ICD-10-CM

## 2019-02-16 DIAGNOSIS — F17210 Nicotine dependence, cigarettes, uncomplicated: Secondary | ICD-10-CM | POA: Insufficient documentation

## 2019-02-16 DIAGNOSIS — N185 Chronic kidney disease, stage 5: Secondary | ICD-10-CM

## 2019-02-16 DIAGNOSIS — Z86718 Personal history of other venous thrombosis and embolism: Secondary | ICD-10-CM | POA: Diagnosis not present

## 2019-02-16 DIAGNOSIS — Z992 Dependence on renal dialysis: Secondary | ICD-10-CM

## 2019-02-16 LAB — IRON AND TIBC
Iron: 70 ug/dL (ref 41–142)
Saturation Ratios: 32 % (ref 21–57)
TIBC: 221 ug/dL — ABNORMAL LOW (ref 236–444)
UIBC: 151 ug/dL (ref 120–384)

## 2019-02-16 LAB — CBC WITH DIFFERENTIAL/PLATELET
Abs Immature Granulocytes: 0.04 10*3/uL (ref 0.00–0.07)
Basophils Absolute: 0 10*3/uL (ref 0.0–0.1)
Basophils Relative: 0 %
Eosinophils Absolute: 0.1 10*3/uL (ref 0.0–0.5)
Eosinophils Relative: 1 %
HCT: 36.3 % (ref 36.0–46.0)
Hemoglobin: 11.1 g/dL — ABNORMAL LOW (ref 12.0–15.0)
Immature Granulocytes: 0 %
Lymphocytes Relative: 23 %
Lymphs Abs: 2.5 10*3/uL (ref 0.7–4.0)
MCH: 28.8 pg (ref 26.0–34.0)
MCHC: 30.6 g/dL (ref 30.0–36.0)
MCV: 94.3 fL (ref 80.0–100.0)
Monocytes Absolute: 0.8 10*3/uL (ref 0.1–1.0)
Monocytes Relative: 7 %
Neutro Abs: 7.2 10*3/uL (ref 1.7–7.7)
Neutrophils Relative %: 69 %
Platelets: 154 10*3/uL (ref 150–400)
RBC: 3.85 MIL/uL — ABNORMAL LOW (ref 3.87–5.11)
RDW: 14.4 % (ref 11.5–15.5)
WBC: 10.6 10*3/uL — ABNORMAL HIGH (ref 4.0–10.5)
nRBC: 0 % (ref 0.0–0.2)

## 2019-02-16 LAB — COMPREHENSIVE METABOLIC PANEL
ALT: 18 U/L (ref 0–44)
AST: 22 U/L (ref 15–41)
Albumin: 3.3 g/dL — ABNORMAL LOW (ref 3.5–5.0)
Alkaline Phosphatase: 91 U/L (ref 38–126)
Anion gap: 10 (ref 5–15)
BUN: 27 mg/dL — ABNORMAL HIGH (ref 8–23)
CO2: 24 mmol/L (ref 22–32)
Calcium: 9.9 mg/dL (ref 8.9–10.3)
Chloride: 103 mmol/L (ref 98–111)
Creatinine, Ser: 6.45 mg/dL (ref 0.44–1.00)
GFR calc Af Amer: 7 mL/min — ABNORMAL LOW (ref >60)
GFR calc non Af Amer: 6 mL/min — ABNORMAL LOW (ref >60)
Glucose, Bld: 111 mg/dL — ABNORMAL HIGH (ref 70–99)
Potassium: 4.6 mmol/L (ref 3.5–5.1)
Sodium: 137 mmol/L (ref 135–145)
Total Bilirubin: <0.2 mg/dL — ABNORMAL LOW (ref 0.3–1.2)
Total Protein: 7.8 g/dL (ref 6.5–8.1)

## 2019-02-16 LAB — FERRITIN: Ferritin: 1329 ng/mL — ABNORMAL HIGH (ref 11–307)

## 2019-02-16 NOTE — Telephone Encounter (Signed)
Gave avs and calendar ° °

## 2019-02-17 ENCOUNTER — Telehealth: Payer: Self-pay

## 2019-02-17 DIAGNOSIS — E8770 Fluid overload, unspecified: Secondary | ICD-10-CM | POA: Diagnosis not present

## 2019-02-17 DIAGNOSIS — E875 Hyperkalemia: Secondary | ICD-10-CM | POA: Diagnosis not present

## 2019-02-17 DIAGNOSIS — N186 End stage renal disease: Secondary | ICD-10-CM | POA: Diagnosis not present

## 2019-02-17 DIAGNOSIS — D631 Anemia in chronic kidney disease: Secondary | ICD-10-CM | POA: Diagnosis not present

## 2019-02-17 DIAGNOSIS — N2581 Secondary hyperparathyroidism of renal origin: Secondary | ICD-10-CM | POA: Diagnosis not present

## 2019-02-17 NOTE — Telephone Encounter (Signed)
Spoke with patient regarding iron level is adequate, per Dr. Burr Medico, no concerns.  She verbalized an understanding.

## 2019-02-17 NOTE — Telephone Encounter (Signed)
-----   Message from Truitt Merle, MD sent at 02/17/2019  6:59 AM EST ----- Please let pt know her iron level is adequate, no concerns, thanks   Truitt Merle  02/17/2019

## 2019-02-19 DIAGNOSIS — E875 Hyperkalemia: Secondary | ICD-10-CM | POA: Diagnosis not present

## 2019-02-19 DIAGNOSIS — N186 End stage renal disease: Secondary | ICD-10-CM | POA: Diagnosis not present

## 2019-02-19 DIAGNOSIS — E8770 Fluid overload, unspecified: Secondary | ICD-10-CM | POA: Diagnosis not present

## 2019-02-19 DIAGNOSIS — N2581 Secondary hyperparathyroidism of renal origin: Secondary | ICD-10-CM | POA: Diagnosis not present

## 2019-02-19 DIAGNOSIS — D631 Anemia in chronic kidney disease: Secondary | ICD-10-CM | POA: Diagnosis not present

## 2019-02-22 DIAGNOSIS — D631 Anemia in chronic kidney disease: Secondary | ICD-10-CM | POA: Diagnosis not present

## 2019-02-22 DIAGNOSIS — E875 Hyperkalemia: Secondary | ICD-10-CM | POA: Diagnosis not present

## 2019-02-22 DIAGNOSIS — N186 End stage renal disease: Secondary | ICD-10-CM | POA: Diagnosis not present

## 2019-02-22 DIAGNOSIS — E8770 Fluid overload, unspecified: Secondary | ICD-10-CM | POA: Diagnosis not present

## 2019-02-22 DIAGNOSIS — N2581 Secondary hyperparathyroidism of renal origin: Secondary | ICD-10-CM | POA: Diagnosis not present

## 2019-02-24 DIAGNOSIS — N2581 Secondary hyperparathyroidism of renal origin: Secondary | ICD-10-CM | POA: Diagnosis not present

## 2019-02-24 DIAGNOSIS — E8770 Fluid overload, unspecified: Secondary | ICD-10-CM | POA: Diagnosis not present

## 2019-02-24 DIAGNOSIS — N186 End stage renal disease: Secondary | ICD-10-CM | POA: Diagnosis not present

## 2019-02-24 DIAGNOSIS — D631 Anemia in chronic kidney disease: Secondary | ICD-10-CM | POA: Diagnosis not present

## 2019-02-24 DIAGNOSIS — E875 Hyperkalemia: Secondary | ICD-10-CM | POA: Diagnosis not present

## 2019-02-28 DIAGNOSIS — I129 Hypertensive chronic kidney disease with stage 1 through stage 4 chronic kidney disease, or unspecified chronic kidney disease: Secondary | ICD-10-CM | POA: Diagnosis not present

## 2019-02-28 DIAGNOSIS — Z992 Dependence on renal dialysis: Secondary | ICD-10-CM | POA: Diagnosis not present

## 2019-02-28 DIAGNOSIS — N186 End stage renal disease: Secondary | ICD-10-CM | POA: Diagnosis not present

## 2019-03-02 ENCOUNTER — Inpatient Hospital Stay (HOSPITAL_COMMUNITY): Payer: Medicare Other

## 2019-03-02 ENCOUNTER — Encounter (HOSPITAL_COMMUNITY): Payer: Self-pay

## 2019-03-02 ENCOUNTER — Inpatient Hospital Stay (HOSPITAL_COMMUNITY)
Admission: EM | Admit: 2019-03-02 | Discharge: 2019-03-03 | DRG: 640 | Disposition: A | Discharge status: Home / Self Care | Payer: Medicare Other | Attending: Internal Medicine | Admitting: Internal Medicine

## 2019-03-02 ENCOUNTER — Other Ambulatory Visit: Payer: Self-pay

## 2019-03-02 DIAGNOSIS — R0902 Hypoxemia: Secondary | ICD-10-CM | POA: Diagnosis not present

## 2019-03-02 DIAGNOSIS — I739 Peripheral vascular disease, unspecified: Secondary | ICD-10-CM | POA: Diagnosis present

## 2019-03-02 DIAGNOSIS — I132 Hypertensive heart and chronic kidney disease with heart failure and with stage 5 chronic kidney disease, or end stage renal disease: Secondary | ICD-10-CM | POA: Diagnosis present

## 2019-03-02 DIAGNOSIS — I5043 Acute on chronic combined systolic (congestive) and diastolic (congestive) heart failure: Secondary | ICD-10-CM

## 2019-03-02 DIAGNOSIS — Z7901 Long term (current) use of anticoagulants: Secondary | ICD-10-CM

## 2019-03-02 DIAGNOSIS — Z888 Allergy status to other drugs, medicaments and biological substances status: Secondary | ICD-10-CM

## 2019-03-02 DIAGNOSIS — F17210 Nicotine dependence, cigarettes, uncomplicated: Secondary | ICD-10-CM | POA: Diagnosis present

## 2019-03-02 DIAGNOSIS — J449 Chronic obstructive pulmonary disease, unspecified: Secondary | ICD-10-CM | POA: Diagnosis present

## 2019-03-02 DIAGNOSIS — E8779 Other fluid overload: Secondary | ICD-10-CM | POA: Diagnosis not present

## 2019-03-02 DIAGNOSIS — N2581 Secondary hyperparathyroidism of renal origin: Secondary | ICD-10-CM | POA: Diagnosis not present

## 2019-03-02 DIAGNOSIS — Z8249 Family history of ischemic heart disease and other diseases of the circulatory system: Secondary | ICD-10-CM

## 2019-03-02 DIAGNOSIS — R51 Headache: Secondary | ICD-10-CM | POA: Diagnosis not present

## 2019-03-02 DIAGNOSIS — Z79899 Other long term (current) drug therapy: Secondary | ICD-10-CM | POA: Diagnosis not present

## 2019-03-02 DIAGNOSIS — Z803 Family history of malignant neoplasm of breast: Secondary | ICD-10-CM

## 2019-03-02 DIAGNOSIS — R05 Cough: Secondary | ICD-10-CM | POA: Diagnosis not present

## 2019-03-02 DIAGNOSIS — I2510 Atherosclerotic heart disease of native coronary artery without angina pectoris: Secondary | ICD-10-CM | POA: Diagnosis present

## 2019-03-02 DIAGNOSIS — F329 Major depressive disorder, single episode, unspecified: Secondary | ICD-10-CM | POA: Diagnosis present

## 2019-03-02 DIAGNOSIS — D509 Iron deficiency anemia, unspecified: Secondary | ICD-10-CM | POA: Diagnosis not present

## 2019-03-02 DIAGNOSIS — Z6834 Body mass index (BMI) 34.0-34.9, adult: Secondary | ICD-10-CM | POA: Diagnosis not present

## 2019-03-02 DIAGNOSIS — D631 Anemia in chronic kidney disease: Secondary | ICD-10-CM | POA: Diagnosis present

## 2019-03-02 DIAGNOSIS — I5032 Chronic diastolic (congestive) heart failure: Secondary | ICD-10-CM | POA: Diagnosis present

## 2019-03-02 DIAGNOSIS — G4733 Obstructive sleep apnea (adult) (pediatric): Secondary | ICD-10-CM | POA: Diagnosis present

## 2019-03-02 DIAGNOSIS — Z85038 Personal history of other malignant neoplasm of large intestine: Secondary | ICD-10-CM

## 2019-03-02 DIAGNOSIS — R0602 Shortness of breath: Secondary | ICD-10-CM | POA: Diagnosis present

## 2019-03-02 DIAGNOSIS — N186 End stage renal disease: Secondary | ICD-10-CM | POA: Diagnosis present

## 2019-03-02 DIAGNOSIS — Z91041 Radiographic dye allergy status: Secondary | ICD-10-CM

## 2019-03-02 DIAGNOSIS — Z992 Dependence on renal dialysis: Secondary | ICD-10-CM | POA: Diagnosis not present

## 2019-03-02 DIAGNOSIS — F419 Anxiety disorder, unspecified: Secondary | ICD-10-CM | POA: Diagnosis present

## 2019-03-02 DIAGNOSIS — I10 Essential (primary) hypertension: Secondary | ICD-10-CM | POA: Diagnosis present

## 2019-03-02 DIAGNOSIS — Z9115 Patient's noncompliance with renal dialysis: Secondary | ICD-10-CM

## 2019-03-02 DIAGNOSIS — I959 Hypotension, unspecified: Secondary | ICD-10-CM | POA: Diagnosis not present

## 2019-03-02 DIAGNOSIS — Z86718 Personal history of other venous thrombosis and embolism: Secondary | ICD-10-CM

## 2019-03-02 DIAGNOSIS — E8770 Fluid overload, unspecified: Principal | ICD-10-CM | POA: Diagnosis present

## 2019-03-02 DIAGNOSIS — Z886 Allergy status to analgesic agent status: Secondary | ICD-10-CM

## 2019-03-02 DIAGNOSIS — I82409 Acute embolism and thrombosis of unspecified deep veins of unspecified lower extremity: Secondary | ICD-10-CM | POA: Diagnosis present

## 2019-03-02 DIAGNOSIS — I444 Left anterior fascicular block: Secondary | ICD-10-CM | POA: Diagnosis present

## 2019-03-02 DIAGNOSIS — Z86711 Personal history of pulmonary embolism: Secondary | ICD-10-CM

## 2019-03-02 DIAGNOSIS — R0789 Other chest pain: Secondary | ICD-10-CM | POA: Diagnosis not present

## 2019-03-02 DIAGNOSIS — G2581 Restless legs syndrome: Secondary | ICD-10-CM | POA: Diagnosis present

## 2019-03-02 DIAGNOSIS — E875 Hyperkalemia: Secondary | ICD-10-CM

## 2019-03-02 DIAGNOSIS — Z832 Family history of diseases of the blood and blood-forming organs and certain disorders involving the immune mechanism: Secondary | ICD-10-CM

## 2019-03-02 DIAGNOSIS — Z9049 Acquired absence of other specified parts of digestive tract: Secondary | ICD-10-CM

## 2019-03-02 DIAGNOSIS — K7460 Unspecified cirrhosis of liver: Secondary | ICD-10-CM | POA: Diagnosis present

## 2019-03-02 DIAGNOSIS — Z23 Encounter for immunization: Secondary | ICD-10-CM | POA: Diagnosis not present

## 2019-03-02 DIAGNOSIS — R079 Chest pain, unspecified: Secondary | ICD-10-CM | POA: Diagnosis not present

## 2019-03-02 DIAGNOSIS — I2699 Other pulmonary embolism without acute cor pulmonale: Secondary | ICD-10-CM | POA: Diagnosis present

## 2019-03-02 DIAGNOSIS — I4581 Long QT syndrome: Secondary | ICD-10-CM | POA: Diagnosis not present

## 2019-03-02 LAB — COMPREHENSIVE METABOLIC PANEL
ALT: 18 U/L (ref 0–44)
AST: 14 U/L — ABNORMAL LOW (ref 15–41)
Albumin: 3.1 g/dL — ABNORMAL LOW (ref 3.5–5.0)
Alkaline Phosphatase: 71 U/L (ref 38–126)
Anion gap: 14 (ref 5–15)
BUN: 113 mg/dL — ABNORMAL HIGH (ref 8–23)
CO2: 10 mmol/L — ABNORMAL LOW (ref 22–32)
Calcium: 9.5 mg/dL (ref 8.9–10.3)
Chloride: 111 mmol/L (ref 98–111)
Creatinine, Ser: 15.49 mg/dL — ABNORMAL HIGH (ref 0.44–1.00)
GFR calc Af Amer: 3 mL/min — ABNORMAL LOW (ref >60)
GFR calc non Af Amer: 2 mL/min — ABNORMAL LOW (ref >60)
Glucose, Bld: 77 mg/dL (ref 70–99)
Potassium: >7.5 mmol/L (ref 3.5–5.1)
Sodium: 135 mmol/L (ref 135–145)
Total Bilirubin: 0.7 mg/dL (ref 0.3–1.2)
Total Protein: 7.6 g/dL (ref 6.5–8.1)

## 2019-03-02 LAB — URINALYSIS, ROUTINE W REFLEX MICROSCOPIC
Bilirubin Urine: NEGATIVE
Glucose, UA: 50 mg/dL — AB
Ketones, ur: 5 mg/dL — AB
Leukocytes,Ua: NEGATIVE
Nitrite: NEGATIVE
Protein, ur: 100 mg/dL — AB
Specific Gravity, Urine: 1.01 (ref 1.005–1.030)
pH: 6 (ref 5.0–8.0)

## 2019-03-02 LAB — I-STAT TROPONIN, ED: Troponin i, poc: 0.04 ng/mL (ref 0.00–0.08)

## 2019-03-02 LAB — CBC WITH DIFFERENTIAL/PLATELET
Abs Immature Granulocytes: 0.05 10*3/uL (ref 0.00–0.07)
Basophils Absolute: 0 10*3/uL (ref 0.0–0.1)
Basophils Relative: 0 %
Eosinophils Absolute: 0 10*3/uL (ref 0.0–0.5)
Eosinophils Relative: 0 %
HCT: 32.7 % — ABNORMAL LOW (ref 36.0–46.0)
Hemoglobin: 9.8 g/dL — ABNORMAL LOW (ref 12.0–15.0)
Immature Granulocytes: 0 %
Lymphocytes Relative: 13 %
Lymphs Abs: 1.7 10*3/uL (ref 0.7–4.0)
MCH: 27.9 pg (ref 26.0–34.0)
MCHC: 30 g/dL (ref 30.0–36.0)
MCV: 93.2 fL (ref 80.0–100.0)
Monocytes Absolute: 0.7 10*3/uL (ref 0.1–1.0)
Monocytes Relative: 6 %
Neutro Abs: 10.5 10*3/uL — ABNORMAL HIGH (ref 1.7–7.7)
Neutrophils Relative %: 81 %
Platelets: 229 10*3/uL (ref 150–400)
RBC: 3.51 MIL/uL — ABNORMAL LOW (ref 3.87–5.11)
RDW: 14 % (ref 11.5–15.5)
WBC: 13 10*3/uL — ABNORMAL HIGH (ref 4.0–10.5)
nRBC: 0 % (ref 0.0–0.2)

## 2019-03-02 LAB — BRAIN NATRIURETIC PEPTIDE: B Natriuretic Peptide: 1671 pg/mL — ABNORMAL HIGH (ref 0.0–100.0)

## 2019-03-02 LAB — MRSA PCR SCREENING: MRSA by PCR: NEGATIVE

## 2019-03-02 MED ORDER — ONDANSETRON HCL 4 MG/2ML IJ SOLN: 4.0000 mg | Freq: Four times a day (QID) | INTRAMUSCULAR | Status: DC | PRN

## 2019-03-02 MED ORDER — MECLIZINE HCL 25 MG PO TABS: 12.5000 mg | ORAL_TABLET | Freq: Three times a day (TID) | ORAL | Status: DC | PRN

## 2019-03-02 MED ORDER — HYDRALAZINE HCL 20 MG/ML IJ SOLN: 10.0000 mg | INTRAMUSCULAR | Status: DC | PRN

## 2019-03-02 MED ORDER — BIOTIN 5000 MCG PO TABS: 1.0000 | ORAL_TABLET | Freq: Every day | ORAL | Status: DC

## 2019-03-02 MED ORDER — PRAMIPEXOLE DIHYDROCHLORIDE 0.125 MG PO TABS: 0.2500 mg | ORAL_TABLET | Freq: Every day | ORAL | Status: DC

## 2019-03-02 MED ORDER — DEXTROSE 50 % IV SOLN
1.0000 | Freq: Once | INTRAVENOUS | Status: AC
  Administered 2019-03-02: 50 mL via INTRAVENOUS
  Filled 2019-03-02: qty 50

## 2019-03-02 MED ORDER — SODIUM POLYSTYRENE SULFONATE 15 GM/60ML PO SUSP
30.0000 g | ORAL | Status: AC
  Administered 2019-03-02: 30 g via ORAL
  Filled 2019-03-02: qty 120

## 2019-03-02 MED ORDER — INSULIN ASPART 100 UNIT/ML IV SOLN
10.0000 [IU] | Freq: Once | INTRAVENOUS | Status: AC
  Administered 2019-03-02: 10 [IU] via INTRAVENOUS

## 2019-03-02 MED ORDER — HYDRALAZINE HCL 20 MG/ML IJ SOLN
20.0000 mg | Freq: Once | INTRAMUSCULAR | Status: AC
  Administered 2019-03-02: 20 mg via INTRAVENOUS
  Filled 2019-03-02: qty 1

## 2019-03-02 MED ORDER — ALBUTEROL SULFATE (2.5 MG/3ML) 0.083% IN NEBU
5.0000 mg | INHALATION_SOLUTION | Freq: Once | RESPIRATORY_TRACT | Status: AC
  Administered 2019-03-02: 5 mg via RESPIRATORY_TRACT
  Filled 2019-03-02: qty 6

## 2019-03-02 MED ORDER — ACETAMINOPHEN 500 MG PO TABS
1000.0000 mg | ORAL_TABLET | Freq: Two times a day (BID) | ORAL | Status: DC | PRN
  Filled 2019-03-02: qty 2

## 2019-03-02 MED ORDER — LORAZEPAM 2 MG/ML IJ SOLN
1.0000 mg | Freq: Once | INTRAMUSCULAR | Status: AC
  Administered 2019-03-03: 1 mg via INTRAVENOUS

## 2019-03-02 MED ORDER — ALBUTEROL SULFATE (2.5 MG/3ML) 0.083% IN NEBU
2.5000 mg | INHALATION_SOLUTION | Freq: Once | RESPIRATORY_TRACT | Status: AC
  Administered 2019-03-02: 2.5 mg via RESPIRATORY_TRACT
  Filled 2019-03-02: qty 3

## 2019-03-02 MED ORDER — CHLORHEXIDINE GLUCONATE CLOTH 2 % EX PADS
6.0000 | MEDICATED_PAD | Freq: Every day | CUTANEOUS | Status: DC
  Administered 2019-03-03: 6 via TOPICAL

## 2019-03-02 MED ORDER — FERRIC CITRATE 1 GM 210 MG(FE) PO TABS: 210.0000 mg | ORAL_TABLET | ORAL | Status: DC

## 2019-03-02 MED ORDER — ONDANSETRON HCL 4 MG PO TABS: 4.0000 mg | ORAL_TABLET | Freq: Four times a day (QID) | ORAL | Status: DC | PRN

## 2019-03-02 MED ORDER — FERRIC CITRATE 1 GM 210 MG(FE) PO TABS
210.0000 mg | ORAL_TABLET | Freq: Three times a day (TID) | ORAL | Status: DC
  Administered 2019-03-03: 420 mg via ORAL
  Filled 2019-03-02: qty 2

## 2019-03-02 MED ORDER — ALBUTEROL SULFATE (2.5 MG/3ML) 0.083% IN NEBU: 2.5000 mg | INHALATION_SOLUTION | Freq: Four times a day (QID) | RESPIRATORY_TRACT | Status: DC | PRN

## 2019-03-02 MED ORDER — CALCIUM GLUCONATE-NACL 1-0.675 GM/50ML-% IV SOLN
1.0000 g | Freq: Once | INTRAVENOUS | Status: AC
  Administered 2019-03-02: 1000 mg via INTRAVENOUS
  Filled 2019-03-02: qty 50

## 2019-03-02 MED ORDER — APIXABAN 2.5 MG PO TABS
2.5000 mg | ORAL_TABLET | Freq: Two times a day (BID) | ORAL | Status: DC
  Administered 2019-03-03: 2.5 mg via ORAL
  Filled 2019-03-02 (×2): qty 1

## 2019-03-02 NOTE — Progress Notes (Signed)
New Admission Note:   Arrival Method: Arrived from [facility] ED via stretcher Mental Orientation: Alert and oriented x4 Telemetry: Box #8 Assessment: Completed Skin: See doc flowsheet IV: Rt AC-NSL Pain: Denies Tubes: N/A Safety Measures: Safety Fall Prevention Plan has been discussed.  Admission: Completed 5MW Orientation: Patient has been orientated to the room, unit and staff.  Family: Daughter at bedside  Orders have been reviewed and implemented. Will continue to monitor the patient. Call light has been placed within reach and bed alarm has been activated.   Jeremias Broyhill [employer], RN-BC Phone number: [phone]

## 2019-03-02 NOTE — ED Provider Notes (Signed)
  Physical Exam  BP (!) 151/66 (BP Location: Right Arm)   Pulse 95   Temp (!) 97.4 F (36.3 C) (Oral)   Resp (!) 21   Ht 5\' 1"  (1.549 m)   Wt 84.9 kg   SpO2 100%   BMI 35.37 kg/m   Physical Exam Vitals signs and nursing note reviewed.  Constitutional:      Appearance: Normal appearance. She is obese. She is not ill-appearing.  HENT:     Head: Normocephalic.  Eyes:     Conjunctiva/sclera: Conjunctivae normal.  Cardiovascular:     Rate and Rhythm: Normal rate and regular rhythm.  Pulmonary:     Effort: Tachypnea present.     Breath sounds: No decreased breath sounds, wheezing, rhonchi or rales.  Skin:    General: Skin is dry.  Neurological:     Mental Status: She is alert.  Psychiatric:        Mood and Affect: Mood is anxious.     ED Course/Procedures   Clinical Course as of Mar 01 2148  Tue Mar 02, 2019  1900 Re-evaluated patient as this time as she yelled "I cannot breath" into the hallway. Patient 100% on RA. States that she cannot take the spasms in her feet and is sitting upright in bed. Patient appears anxious. Lung sounds clear. Also evaluated by Dr. Ronnald Nian bedside. Will ordered 1mg  IV ativan   [KM]  1932 Patient re-evaluated at this time. Resting on stretcher. Being treated for hyperkalemia at present. Awaiting repeat BMP, admission and dialysis.    [KM]    Clinical Course User Index [KM] Alveria Apley, PA-C    Procedures  MDM  Patient Admitted for further workup   Clinical Impression: 1. SOB (shortness of breath)   2. Hyperkalemia   3. Hypervolemia, unspecified hypervolemia type     Disposition: admit    This note was prepared with assistance of Dragon voice recognition software. Occasional wrong-word or sound-a-like substitutions may have occurred due to the inherent limitations of voice recognition software.      Alveria Apley, PA-C 03/02/19 2149    Lennice Sites, DO 03/03/19 0119

## 2019-03-02 NOTE — ED Notes (Signed)
Ambulated patient to bathroom, patient became agitated while trying to help her.  Observed patient becoming short of breath on the way back from the bathroom, notified RN.

## 2019-03-02 NOTE — ED Provider Notes (Addendum)
[facility] EMERGENCY DEPARTMENT Provider Note   CSN: [id number] Arrival date & time: 03/02/19  1259    History   Chief Complaint Chief Complaint  Patient presents with  . Shortness of Breath    dialysis pt (missed dialysis x2)    HPI Whitney Walker is a 63 y.o. female with a PMH of CHF, COPD, CAD, ESRD on dialysis, and HTN presenting with shortness of breath onset 2 days ago. Patient reports she goes to dialysis on MWF, but states she has missed the last two sessions (Friday and Monday) because she went out of town. Patient states she takes Eliquis due to history of DVT. Patient denies missing any doses of her medications. Patient denies fever, cough, congestion, chest pain, vomiting, diarrhea, or abdominal pain.      HPI  Past Medical History:  Diagnosis Date  . Anemia   . Anxiety   . CHF (congestive heart failure) ([place])   . Colon cancer [facility])    treatment surgery  . Complication of anesthesia    after first C- [place] "couldnt walk after", patient denies having a spinal  . COPD (chronic obstructive pulmonary disease) ([place])   . Coronary artery disease   . Depression   . DVT (deep venous thrombosis) ([place])   . ESRD (end stage renal disease) ([place])    Hemo: MWF  . History of blood transfusion 04/2018  . Hypertension    07/07/18- no longer takes BP medications  . Meningitis   . PE (pulmonary embolism)   . Peripheral vascular disease ([place])   . Restless legs   . Shortness of breath    with exertion  . Sleep apnea   . Vertigo     Patient Active Problem List   Diagnosis Date Noted  . Volume overload 03/02/2019  . Dizziness 09/19/2018  . Restless leg syndrome 09/19/2018  . Hyperkalemia 04/29/2018  . OSA (obstructive sleep apnea) 04/29/2018  . ESRD (end stage renal disease) ([place]) 04/29/2018  . Dyspnea on exertion 03/12/2017  . Morbid (severe) obesity due to excess calories ([place]) 03/12/2017  . Chest pain 03/02/2017  . Anemia of chronic kidney  failure, stage 3 (moderate) ([place]) 12/10/2016  . History of colon cancer 08/12/2016  . Mass of left breast on mammogram 12/10/2015  . MDD (major depressive disorder), recurrent severe, without psychosis ([place]) 10/19/2015  . HTN (hypertension) 10/18/2015  . DVT (deep venous thrombosis) ([place]) 10/18/2015  . Depression 10/18/2015  . Pain, joint, lower leg, left 01/24/2015  . Numbness and tingling of left arm and leg 01/24/2015  . Aftercare following surgery of the circulatory system, [place] 07/19/2014  . PVD (peripheral vascular disease) with claudication ([place]) 03/23/2014  . Swelling of limb-Left leg 03/23/2014  . Weakness-Left arm/Leg 03/23/2014  . PAD (peripheral artery disease) ([place]) 12/14/2013  . CKD (chronic kidney disease) stage 4, GFR 15-29 ml/min (HCC) 11/15/2013  . Chronic diastolic heart failure ([place]) 11/15/2013  . Essential hypertension 11/15/2013  . Pulmonary embolism ([place]) 11/14/2013  . Pain in limb 07/30/2013  . Atherosclerosis of native arteries of the extremities with ulceration(440.23) 07/30/2013  . Cirrhosis ([place]) 05/21/2012  . Leucocytosis 04/28/2012  . Colon cancer ([place]) 04/28/2012    Past Surgical History:  Procedure Laterality Date  . ABDOMINAL AORTAGRAM N/A 07/26/2013   Procedure: ABDOMINAL Maxcine Ham;  Surgeon: [name] [place], MD;  Location: [facility] CATH LAB;  Service: Cardiovascular;  Laterality: N/A;  . ABDOMINAL HYSTERECTOMY    . AV FISTULA PLACEMENT Left 05/05/2018   Procedure: ARTERIOVENOUS (  AV) FISTULA CREATION BRACHIOCEPHALIC;  Surgeon: Waynetta Sandy, MD;  Location: [facility];  Service: Vascular;  Laterality: Left;  . AV FISTULA PLACEMENT Left 07/16/2018   Procedure: ARTERIOVENOUS FISTULA CREATION;  Surgeon: Waynetta Sandy, MD;  Location: [facility];  Service: Vascular;  Laterality: Left;  . BASCILIC VEIN TRANSPOSITION Left 09/03/2018   Procedure: BASILIC VEIN TRANSPOSITION SECOND STAGE;  Surgeon: Waynetta Sandy, MD;  Location: [facility];  Service:  Vascular;  Laterality: Left;  . BREAST BIOPSY Left   . CESAREAN SECTION     X 3 [id number]  . CHOLECYSTECTOMY    . CHOLECYSTECTOMY  1980/s  . COLECTOMY  2010  . DIVERTICULOSIS SURGERY-2002  2012  . FEMORAL-POPLITEAL BYPASS GRAFT Left 08/13/2013   Procedure: BYPASS GRAFT FEMORAL-POPLITEAL ARTERY WITH NON-REVERSED SAPHANEOUS VEIN; ULTRASOUND GUIDED;  Surgeon: Mal [name], MD;  Location: [facility];  Service: Vascular;  Laterality: Left;  . INSERTION OF DIALYSIS CATHETER Right 04/30/2018   Procedure: INSERTION OF Right Internal Jugular DIALYSIS CATHETER;  Surgeon: [name], MD;  Location: [facility];  Service: Vascular;  Laterality: Right;  . LOWER EXTREMITY ANGIOGRAM Left 07/26/2013   Procedure: LOWER EXTREMITY ANGIOGRAM;  Surgeon: [name] Brule, MD;  Location[facility] C[facility] LAB;  Service: Cardiovascular;  Laterality: Left;     OB History   No obstetric history on file.      Home Medications    Prior to Admission medications   Medication Sig Start Date End Date Taking? Authorizing Provider  acetaminophen (TYLENOL) 500 MG tablet Take 1,000 mg by mouth 2 (two) times daily as needed for moderate pain or headache.   Yes [provider]  b complex-vitamin c-folic acid (NEPHRO-VITE) 0.8 MG TABS tablet Take 1 tablet by mouth at bedtime.   Yes [provider]  ferric citrate (AURYXIA) 1 GM 210 MG(Fe) tablet Take 210-420 mg by mouth See admin instructions. Take 210-420 mg with each meal (based on size of meal) and 210 mg with each snack    Yes [provider]  nebivolol (BYSTOLIC) 10 MG tablet Take 10 mg by mouth daily.   Yes [provider]  pramipexole (MIRAPEX) 0.25 MG tablet Take 0.25-0.5 mg by mouth See admin instructions. Take 0.25mg  to 0.5mg  by mouth 2-3 hours before bedtime and 0.25mg  prior to dialysis.   Yes [provider]  albuterol (PROVENTIL HFA;VENTOLIN HFA) 108 (90 Base) MCG/ACT inhaler Inhale 2 puffs into the lungs every 6 (six) hours as needed  for wheezing or shortness of breath.     [provider]  Biotin 5000 MCG TABS Take 1 tablet by mouth daily.    [provider]  calcitRIOL (ROCALTROL) 0.5 MCG capsule Take 1 capsule (0.5 mcg total) by mouth every other day. Patient not taking: Reported on 03/02/2019 05/09/18   Rai, Ripudeep K, MD  ELIQUIS 2.5 MG TABS tablet TAKE 1 TABLET(2.5 MG) BY MOUTH TWICE DAILY Patient taking differently: Take 2.5 mg by mouth 2 (two) times daily.  02/01/19   Truitt Merle, MD  meclizine (ANTIVERT) 12.5 MG tablet Take 12.5 mg by mouth 3 (three) times daily as needed for dizziness.    [provider]  mupirocin ointment (BACTROBAN) 2 % Apply 1 application topically daily as needed (skin bumps).     [provider]  NYSTATIN powder Apply 1 Bottle topically 2 (two) times daily as needed (dryness).  05/23/17   [provider]  oxyCODONE-acetaminophen (PERCOCET/ROXICET) 5-325 MG tablet Take 1 tablet by mouth every 6 (six)  hours as needed. Patient not taking: Reported on 02/16/2019 09/03/18   Ulyses Amor, PA-C    Family History Family History  Problem Relation Age of Onset  . Cancer Mother 5       breast and bone  . Cancer Father 78       prostate  . Hypertension Sister   . Bleeding Disorder Sister   . Cancer Cousin 20       breast cancer   . Hypertension Daughter   . Breast cancer Neg Hx     Social History Social History   Tobacco Use  . Smoking status: Current Some Day Smoker    Packs/day: 1.00    Years: 40.00    Pack years: 40.00    Types: Cigarettes  . Smokeless tobacco: Former Systems developer  . Tobacco comment: less than 1 pack  Substance Use Topics  . Alcohol use: No    Alcohol/week: 0.0 standard drinks    Comment: she used to drink alcohol, quit in 2010   . Drug use: No     Allergies   Gadolinium derivatives; Iohexol; and Iodine  Review of Systems Review of Systems  Constitutional: Negative for chills, diaphoresis, fatigue, fever and unexpected  weight change.  HENT: Negative for congestion, rhinorrhea, sore throat and trouble swallowing.   Eyes: Negative for visual disturbance.  Respiratory: Positive for shortness of breath. Negative for cough, chest tightness, wheezing and stridor.   Cardiovascular: Negative for chest pain, palpitations and leg swelling.  Gastrointestinal: Negative for abdominal pain, nausea and vomiting.  Endocrine: Negative for cold intolerance and heat intolerance.  Skin: Negative for pallor and rash.  Allergic/Immunologic: Negative for environmental allergies, food allergies and immunocompromised state.  Neurological: Negative for dizziness, syncope, speech difficulty, weakness, light-headedness and numbness.  Psychiatric/Behavioral: The patient is not nervous/anxious.      Physical Exam Updated Vital Signs BP 120/76 (BP Location: Right Arm)   Pulse 83   Temp 98 F (36.7 C) (Oral)   Resp 19   Ht 5\' 1"  (1.549 m)   Wt 82.4 kg   SpO2 100%   BMI 34.32 kg/m   Physical Exam Vitals signs and nursing note reviewed.  Constitutional:      General: She is not in acute distress.    Appearance: She is well-developed. She is not diaphoretic.  HENT:     Head: Normocephalic and atraumatic.     Mouth/Throat:     Mouth: Mucous membranes are moist.     Pharynx: No pharyngeal swelling or oropharyngeal exudate.  Eyes:     Extraocular Movements: Extraocular movements intact.     Pupils: Pupils are equal, round, and reactive to light.  Neck:     Musculoskeletal: Normal range of motion and neck supple.  Cardiovascular:     Rate and Rhythm: Normal rate and regular rhythm.     Heart sounds: Normal heart sounds. No murmur. No friction rub. No gallop.   Pulmonary:     Effort: Pulmonary effort is normal. No respiratory distress.     Breath sounds: Examination of the right-middle field reveals wheezing. Examination of the left-middle field reveals wheezing. Examination of the right-lower field reveals wheezing.  Examination of the left-lower field reveals wheezing. Wheezing present. No decreased breath sounds, rhonchi or rales.  Chest:     Chest wall: No tenderness.  Abdominal:     Palpations: Abdomen is soft.     Tenderness: There is no abdominal tenderness.  Musculoskeletal: Normal range of motion.  Right lower leg: She exhibits no tenderness. No edema.     Left lower leg: She exhibits no tenderness. No edema.  Skin:    General: Skin is warm.     Findings: No erythema or rash.  Neurological:     Mental Status: She is alert and oriented to person, place, and time.    ED Treatments / Results  Labs (all labs ordered are listed, but only abnormal results are displayed) Labs Reviewed  COMPREHENSIVE METABOLIC PANEL - Abnormal; Notable for the following components:      Result Value   Potassium >7.5 (*)    CO2 10 (*)    BUN 113 (*)    Creatinine, Ser 15.49 (*)    Albumin 3.1 (*)    AST 14 (*)    GFR calc non Af Amer 2 (*)    GFR calc Af Amer 3 (*)    All other components within normal limits  CBC WITH DIFFERENTIAL/PLATELET - Abnormal; Notable for the following components:   WBC 13.0 (*)    RBC 3.51 (*)    Hemoglobin 9.8 (*)    HCT 32.7 (*)    Neutro Abs 10.5 (*)    All other components within normal limits  URINALYSIS, ROUTINE W REFLEX MICROSCOPIC - Abnormal; Notable for the following components:   Glucose, UA 50 (*)    Hgb urine dipstick SMALL (*)    Ketones, ur 5 (*)    Protein, ur 100 (*)    Bacteria, UA RARE (*)    All other components within normal limits  BRAIN NATRIURETIC PEPTIDE - Abnormal; Notable for the following components:   B Natriuretic Peptide 1,671.0 (*)    All other components within normal limits  BASIC METABOLIC PANEL - Abnormal; Notable for the following components:   Sodium 134 (*)    CO2 20 (*)    BUN 31 (*)    Creatinine, Ser 6.40 (*)    GFR calc non Af Amer 6 (*)    GFR calc Af Amer 7 (*)    Anion gap 16 (*)    All other components within normal  limits  PHOSPHORUS - Abnormal; Notable for the following components:   Phosphorus 6.8 (*)    All other components within normal limits  POCT I-STAT EG7 - Abnormal; Notable for the following components:   pH, Ven 7.512 (*)    pCO2, Ven 31.8 (*)    pO2, Ven 20.0 (*)    Acid-Base Excess 3.0 (*)    Sodium 134 (*)    Potassium 3.2 (*)    Calcium, Ion 1.06 (*)    HCT 32.0 (*)    Hemoglobin 10.9 (*)    All other components within normal limits  MRSA PCR SCREENING  HEPATITIS B SURFACE ANTIGEN  I-STAT TROPONIN, ED    EKG EKG Interpretation  Date/Time:  Tuesday March 02 2019 13:12:26 EST Ventricular Rate:  75 PR Interval:    QRS Duration: 91 QT Interval:  412 QTC Calculation: 461 R Axis:   -74 Text Interpretation:  Sinus rhythm Borderline prolonged PR interval Left anterior fascicular block Abnormal R-wave progression, late transition ST elevation, consider inferior injury peaked T waves consistent with hyperkalemia Confirmed by Charlesetta Shanks [phone]) on 03/02/2019 3:48:26 PM   Radiology Dg Chest 2 View  Result Date: 03/02/2019 CLINICAL DATA:  Shortness of breath. Missed dialysis. History of COPD. EXAM: CHEST - 2 VIEW COMPARISON:  Chest radiograph Apr 30, 2018 FINDINGS: Stable cardiomegaly. Mediastinal silhouette is not suspicious.  Pulmonary vascular congestion without pleural effusion focal consolidation. No pneumothorax. Tunneled RIGHT dialysis catheter in situ. Soft tissue planes included osseous structures are non suspicious. IMPRESSION: 1. Stable cardiomegaly.  Pulmonary vascular congestion. Electronically Signed   By: Elon Alas M.D.   On: 03/02/2019 18:12    Procedures Procedures (including critical care time)  Medications Ordered in ED Medications  Chlorhexidine Gluconate Cloth 2 % PADS 6 each (6 each Topical Given 03/03/19 0506)  acetaminophen (TYLENOL) tablet 1,000 mg (1,000 mg Oral Not Given 03/03/19 0314)  ferric citrate (AURYXIA) tablet 210-420 mg (has no  administration in time range)  meclizine (ANTIVERT) tablet 12.5 mg (has no administration in time range)  apixaban (ELIQUIS) tablet 2.5 mg (2.5 mg Oral Not Given 03/03/19 0457)  pramipexole (MIRAPEX) tablet 0.25-0.5 mg (has no administration in time range)  albuterol (PROVENTIL) (2.5 MG/3ML) 0.083% nebulizer solution 2.5 mg (has no administration in time range)  ondansetron (ZOFRAN) tablet 4 mg (has no administration in time range)    Or  ondansetron (ZOFRAN) injection 4 mg (has no administration in time range)  hydrALAZINE (APRESOLINE) injection 10 mg (has no administration in time range)  sodium polystyrene (KAYEXALATE) 15 GM/60ML suspension 30 g (30 g Oral Given 03/02/19 2024)  ferric citrate (AURYXIA) tablet 210 mg (has no administration in time range)  albuterol (PROVENTIL) (2.5 MG/3ML) 0.083% nebulizer solution 5 mg (5 mg Nebulization Given 03/02/19 1459)  hydrALAZINE (APRESOLINE) injection 20 mg (20 mg Intravenous Given 03/02/19 1828)  albuterol (PROVENTIL) (2.5 MG/3ML) 0.083% nebulizer solution 2.5 mg (2.5 mg Nebulization Given 03/02/19 1821)  insulin aspart (novoLOG) injection 10 Units (10 Units Intravenous Given 03/02/19 1833)  dextrose 50 % solution 50 mL (50 mLs Intravenous Given 03/02/19 1834)  calcium gluconate 1 g/ 50 mL sodium chloride IVPB (0 mg Intravenous Stopped 03/02/19 2000)  LORazepam (ATIVAN) injection 1 mg (1 mg Intravenous Given 03/03/19 0152)  heparin injection 4,000 Units (4,000 Units Intravenous Given 03/03/19 0027)  heparin injection 3,400 Units (3,400 Units Intracatheter Given 03/03/19 0401)  heparin 1000 UNIT/ML injection (  Duplicate 02/28/94 1884)  LORazepam (ATIVAN) 2 MG/ML injection (  Duplicate 01/04/59 6301)  acetaminophen (TYLENOL) 500 MG tablet (  Duplicate 6/0/10 9323)  heparin 1000 UNIT/ML injection (  Duplicate 05/03/72 2202)     Initial Impression / Assessment and Plan / ED Course  I have reviewed the triage vital signs and the nursing notes.  Pertinent labs & imaging  results that were available during my care of the patient were reviewed by me and considered in my medical decision making (see chart for details).  Clinical Course as of Mar 03 803  Tue Mar 02, 2019  1900 Re-evaluated patient as this time as she yelled "I cannot breath" into the hallway. Patient 100% on RA. States that she cannot take the spasms in her feet and is sitting upright in bed. Patient appears anxious. Lung sounds clear. Also evaluated by Dr. Ronnald Nian bedside. Will ordered 1mg  IV ativan   [KM]  1932 Patient re-evaluated at this time. Resting on stretcher. Being treated for hyperkalemia at present. Awaiting repeat BMP, admission and dialysis.    [KM]    Clinical Course User Index [KM] Alveria Apley, PA-C       Patient presents with shortness of breath. Patient has required 2L of oxygen. Provided albuterol treatment due to wheezing. Patient has been difficult to obtain IV access for labs. Labs are pending. Patient has been hypertensive. Ordered hydralazine. Patient is well known by nephrology and  reached out to the attending physician. Discussed case with nephrology. Nephrology recommends dialysis and admission for observation. Nephrology reports patient has had previous episodes of hyperkalemia. EKG reveals peaked T waves. Labs and CXR are pending. Consulted hospitalist for admission. Hospitalist has agreed to admit patient. Patient is pending dialysis.   Findings and plan of care discussed with supervising physician Dr. Johnney Killian.  At shift change care was transferred to [facility], PA-C who will follow pending studies, re-evaluate and determine disposition.     Final Clinical Impressions(s) / ED Diagnoses   Final diagnoses:  SOB (shortness of breath)  Hyperkalemia  Hypervolemia, unspecified hypervolemia type    ED Discharge Orders    None       Arville Lime, [place] 03/02/19 [place], MD 03/02/19 1637    Arville Lime, PA-C 03/02/19  1641    Arville Lime, [place] 03/03/19 9741    Charlesetta Shanks, MD 03/03/19 1316

## 2019-03-02 NOTE — ED Notes (Signed)
This RN attempted to gain IV access and labs x1 unsuccessfully. IV team consult placed. PA made aware of delay in obtaining labs.

## 2019-03-02 NOTE — ED Notes (Signed)
Patient ambulatory to restroom  ?

## 2019-03-02 NOTE — H&P (Signed)
History and Physical    Whitney Walker KXF:[id number] DOB: 1956/08/17 DOA: 03/02/2019  PCP: Glendale Chard, MD   Patient coming from: Home   Chief Complaint: Dyspnea  HPI: Whitney Walker is a 63 y.o. female with medical history significant of end-stage renal disease on hemodialysis, (Monday, Wednesday and Friday), diastolic heart failure, COPD, history of DVT/PE who presents with worsening dyspnea for the last 24 hours.  Her last hemodialysis was February 24, 2019, she went to [place], and she missed 2 dialysis sessions.  She was told that while being away to go to the emergency department in case she experienced signs of dyspnea or volume overload.  She mentioned having no symptoms until last night when she started developing worsening dyspnea, nausea, and generalized malaise.  Her symptoms were moderate to severe in intensity, persistent in nature, no improving or worsening factors, no associated fevers or chills.  Patient is still able to make urine.   ED Course: Patient was found volume overloaded, hypertensive, she received hydralazine, albuterol nephrology was consulted.  The plan is to undergo urgent dialysis today.  Review of Systems:  1. General: No fevers, no chills, no weight gain or weight loss 2. ENT: No runny nose or sore throat, no hearing disturbances 3. Pulmonary: positive dyspnea, as mentioned in HPI, no cough, wheezing, or hemoptysis 4. Cardiovascular: No angina, claudication, lower extremity edema, pnd or orthopnea 5. Gastrointestinal: Positive nausea, but no vomiting, no diarrhea or constipation 6. Hematology: No easy bruisability or frequent infections 7. Urology: No dysuria, hematuria or increased urinary frequency 8. Dermatology: No rashes. 9. Neurology: No seizures or paresthesias 10. Musculoskeletal: No joint pain or deformities  Past Medical History:  Diagnosis Date  . Anemia   . Anxiety   . CHF (congestive heart failure) ([place])   . Colon cancer [facility])      treatment surgery  . Complication of anesthesia    after first [place]- [place] "couldnt walk after", patient denies having a spinal  . COPD (chronic obstructive pulmonary disease) ([place])   . Coronary artery disease   . Depression   . DVT (deep venous thrombosis) ([place])   . ESRD (end stage renal disease) ([place])    Hemo: MWF  . History of blood transfusion 04/2018  . Hypertension    07/07/18- no longer takes BP medications  . Meningitis   . PE (pulmonary embolism)   . Peripheral vascular disease ([place])   . Restless legs   . Shortness of breath    with exertion  . Sleep apnea   . Vertigo     Past Surgical History:  Procedure Laterality Date  . ABDOMINAL AORTAGRAM N/A 07/26/2013   Procedure: ABDOMINAL Maxcine Ham;  Surgeon: [name] Lamar, MD;  Location: [facility] CATH LAB;  Service: Cardiovascular;  Laterality: N/A;  . ABDOMINAL HYSTERECTOMY    . AV FISTULA PLACEMENT Left 05/05/2018   Procedure: ARTERIOVENOUS (AV) FISTULA CREATION BRACHIOCEPHALIC;  Surgeon: Waynetta Sandy, MD;  Location: [place];  Service: Vascular;  Laterality: Left;  . AV FISTULA PLACEMENT Left 07/16/2018   Procedure: ARTERIOVENOUS FISTULA CREATION;  Surgeon: Waynetta Sandy, MD;  Location: [place];  Service: Vascular;  Laterality: Left;  . BASCILIC VEIN TRANSPOSITION Left 09/03/2018   Procedure: BASILIC VEIN TRANSPOSITION SECOND STAGE;  Surgeon: Waynetta Sandy, MD;  Location: [place];  Service: Vascular;  Laterality: Left;  . BREAST BIOPSY Left   . CESAREAN SECTION     X 3 [id number]  . CHOLECYSTECTOMY    . CHOLECYSTECTOMY  1980/s  .  COLECTOMY  2010  . DIVERTICULOSIS SURGERY-2002  2012  . FEMORAL-POPLITEAL BYPASS GRAFT Left 08/13/2013   Procedure: BYPASS GRAFT FEMORAL-POPLITEAL ARTERY WITH NON-REVERSED SAPHANEOUS VEIN; ULTRASOUND GUIDED;  Surgeon: Mal [name], MD;  Location: [place];  Service: Vascular;  Laterality: Left;  . INSERTION OF DIALYSIS CATHETER Right 04/30/2018   Procedure: INSERTION OF Right  Internal Jugular DIALYSIS CATHETER;  Surgeon: [name], MD;  Location: [place];  Service: Vascular;  Laterality: Right;  . LOWER EXTREMITY ANGIOGRAM Left 07/26/2013   Procedure: LOWER EXTREMITY ANGIOGRAM;  Surgeon: [name] Fairplains, MD;  Location: [facility] CATH LAB;  Service: Cardiovascular;  Laterality: Left;     reports that she has been smoking cigarettes. She has a 40.00 pack-year smoking history. She has quit using smokeless tobacco. She reports that she does not drink alcohol or use drugs.  Allergies  Allergen Reactions  . Gadolinium Derivatives Hives and Other (See Comments)    HIVES, Desc: HIVES W/ "DYE" USED FOR 1ST CT SCAN BUT NOT 2ND, NO PREMEDS USED, PT UNCERTAIN OF CIRCUMSTANCES,,?POSSIBLE MRI CONTRAST ALLERGY, ALL STUDIES DONE "SOMEWHERE" IN [place]//A.C., Onset Date: [id number]  . Iohexol Other (See Comments)     Code: HIVES, Desc: HIVES W/ "DYE" USED FOR 1ST CT SCAN BUT NOT 2ND, NO PREMEDS USED, PT UNCERTAIN OF CIRCUMSTANCES,,?POSSIBLE MRI CONTRAST ALLERGY, ALL STUDIES DONE "SOMEWHERE" IN [place]//A.C., Onset Date: [id number]   . Iodine Hives    Family History  Problem Relation Age of Onset  . Cancer Mother 86       breast and bone  . Cancer Father 44       prostate  . Hypertension Sister   . Bleeding Disorder Sister   . Cancer Cousin 20       breast cancer   . Hypertension Daughter   . Breast cancer Neg Hx      Prior to Admission medications   Medication Sig Start Date End Date Taking? Authorizing Provider  acetaminophen (TYLENOL) 500 MG tablet Take 1,000 mg by mouth 2 (two) times daily as needed for moderate pain or headache.    [provider]  albuterol (PROVENTIL HFA;VENTOLIN HFA) 108 (90 Base) MCG/ACT inhaler Inhale 2 puffs into the lungs every 6 (six) hours as needed for wheezing or shortness of breath.     [provider]  Biotin 5000 MCG TABS Take 1 tablet by mouth daily.    [provider]  calcitRIOL (ROCALTROL) 0.5 MCG  capsule Take 1 capsule (0.5 mcg total) by mouth every other day. Patient taking differently: Take 1 mcg by mouth 3 (three) times a week. Given at dialysis 05/09/18   Rai, Vernelle Emerald, MD  ELIQUIS 2.5 MG TABS tablet TAKE 1 TABLET(2.5 MG) BY MOUTH TWICE DAILY 02/01/19   Truitt Merle, MD  ferric citrate (AURYXIA) 1 GM 210 MG(Fe) tablet Take 210-420 mg by mouth See admin instructions. Take 210-420 mg with each meal (based on size of meal) and 210 mg with each snack     [provider]  meclizine (ANTIVERT) 12.5 MG tablet Take 12.5 mg by mouth 3 (three) times daily as needed for dizziness.    [provider]  mupirocin ointment (BACTROBAN) 2 % Apply 1 application topically daily as needed (skin bumps).     [provider]  NYSTATIN powder Apply 1 Bottle topically 2 (two) times daily as needed (dryness).  05/23/17   [provider]  oxyCODONE-acetaminophen (PERCOCET/ROXICET) 5-325 MG tablet Take 1 tablet by mouth every 6 (six) hours  as needed. Patient not taking: Reported on 02/16/2019 09/03/18   Ulyses Amor, PA-C  pramipexole (MIRAPEX) 0.125 MG tablet Take 0.125 mg by mouth 3 (three) times daily.    [provider]    Physical Exam: Vitals:   03/02/19 1330 03/02/19 1345 03/02/19 1415 03/02/19 1515  BP: (!) 245/103 (!) 241/223 (!) 230/101 (!) 194/82  Pulse: 75 71 76 74  Resp: 14 (!) 22    Temp:      TempSrc:      SpO2: 100% 100% 99% 99%  Weight:      Height:        Vitals:   03/02/19 1330 03/02/19 1345 03/02/19 1415 03/02/19 1515  BP: (!) 245/103 (!) 241/223 (!) 230/101 (!) 194/82  Pulse: 75 71 76 74  Resp: 14 (!) 22    Temp:      TempSrc:      SpO2: 100% 100% 99% 99%  Weight:      Height:       General: deconditioned and ill looking appering Neurology: Awake and alert, non focal Head and Neck. Head normocephalic. Neck supple with no adenopathy or thyromegaly.   E ENT: mild pallor, no icterus, oral mucosa moist Cardiovascular: No JVD. S1-S2  present, rhythmic, no gallops, rubs, or murmurs. Trace lower extremity edema. Pulmonary: positive breath sounds bilaterally, decreased air movement, no wheezing, or rhonchi, but dependent rales. Gastrointestinal. Abdomen protuberant with no organomegaly, non tender, no rebound or guarding Skin. No rashes Musculoskeletal: no joint deformities    Labs on Admission: I have personally reviewed following labs and imaging studies  CBC: No results for input(s): WBC, NEUTROABS, HGB, HCT, MCV, PLT in the last 168 hours. Basic Metabolic Panel: No results for input(s): NA, K, CL, CO2, GLUCOSE, BUN, CREATININE, CALCIUM, MG, PHOS in the last 168 hours. GFR: Estimated Creatinine Clearance: 8.8 mL/min (A) (by C-G formula based on SCr of 6.45 mg/dL [facility])). Liver Function Tests: No results for input(s): AST, ALT, ALKPHOS, BILITOT, PROT, ALBUMIN in the last 168 hours. No results for input(s): LIPASE, AMYLASE in the last 168 hours. No results for input(s): AMMONIA in the last 168 hours. Coagulation Profile: No results for input(s): INR, PROTIME in the last 168 hours. Cardiac Enzymes: No results for input(s): CKTOTAL, CKMB, CKMBINDEX, TROPONINI in the last 168 hours. BNP (last 3 results) No results for input(s): PROBNP in the last 8760 hours. HbA1C: No results for input(s): HGBA1C in the last 72 hours. CBG: No results for input(s): GLUCAP in the last 168 hours. Lipid Profile: No results for input(s): CHOL, HDL, LDLCALC, TRIG, CHOLHDL, LDLDIRECT in the last 72 hours. Thyroid Function Tests: No results for input(s): TSH, T4TOTAL, FREET4, T3FREE, THYROIDAB in the last 72 hours. Anemia Panel: No results for input(s): VITAMINB12, FOLATE, FERRITIN, TIBC, IRON, RETICCTPCT in the last 72 hours. Urine analysis:    Component Value Date/Time   COLORURINE YELLOW 03/02/2019 1416   APPEARANCEUR CLEAR 03/02/2019 1416   LABSPEC 1.010 03/02/2019 1416   PHURINE 6.0 03/02/2019 1416   GLUCOSEU 50 (A) 03/02/2019  1416   HGBUR SMALL (A) 03/02/2019 1416   BILIRUBINUR NEGATIVE 03/02/2019 1416   KETONESUR 5 (A) 03/02/2019 1416   PROTEINUR 100 (A) 03/02/2019 1416   UROBILINOGEN 0.2 10/19/2015 1532   NITRITE NEGATIVE 03/02/2019 1416   LEUKOCYTESUR NEGATIVE 03/02/2019 1416    Radiological Exams on Admission: No results found.  EKG: Independently reviewed.  EKG is sinus rhythm, 75 bpm, left axis deviation, normal intervals, no significant ST segment or  T wave changes.  Assessment/Plan Active Problems:   Volume overload  63 year old female with end-stage renal disease on hemodialysis, (M-W-F), who has missed 2 sessions of hemodialysis, has developed acute dyspnea over the last 24 hours, associated generalized malaise and nausea.  She does have a right intrajugular vein HD catheter for hemodialysis access, along with AV fistula in her left upper extremity.  On the initial physical examination her blood pressure is 245/120, heart rate 75, respiratory rate 22, oxygen saturation 99% on supplemental oxygen per nasal cannula.  The chemistry still pending, her white cell count is 13.0, hemoglobin 9.8, hematocrit 32.7, platelets 229, her urinalysis has 100 protein, specific gravity 1.010.  No chest radiograph.  Patient will be admitted to the hospital with a working diagnosis of volume overload related to end-stage renal disease.  1.  Volume overload due to end-stage renal disease on hemodialysis.  Patient will be admitted to be medical telemetry unit, urgent hemodialysis on admission, she may need hemodialysis in the morning as well.  We will follow-up with nephrology recommendations.  2.  Diastolic heart failure.  Her hypervolemia likely is triggered by renal failure, patient has received hydralazine in the emergency department, further blood pressure control with ultrafiltration.  3.  Uncontrolled hypertension.  Patient is not on any antihypertensive agent, will continue as needed IV hydralazine for now, patient  will undergo ultrafiltration today.  Continue blood pressure monitoring.  4.  History of DVT- PE.  Continue anticoagulation with apixaban.  5.  COPD.  Seems to be stable with no signs of exacerbation, continue as needed albuterol.  6.  Iron deficiency anemia.  Continue iron supplements.  DVT prophylaxis: apixaban Code Status: full  Family Communication: no family at the bedside   Disposition Plan: telemetry   Consults called: Nephrology   Admission status: Inpatient.     Aryana Wonnacott Gerome Apley MD [facility]   03/02/2019, 4:05 PM

## 2019-03-02 NOTE — ED Notes (Signed)
Help get patient undress into a gown on the monitor did ekg shown to er doctor patient is resting with call bell in reach

## 2019-03-02 NOTE — ED Triage Notes (Signed)
Pt arrived by [facility] from home c/o [facility] for the past 2 days. Pt has hx of HTN, COPD, and goes to dialysis MWF but has missed dialysis this Monday and Friday 02/26/2019. Pt is on eliquis for hx of DVT. Pt a&o. Pt hypertensive currently; BP 227/95.   EMS vitals: CBG 90 HR 76 RR 20 91% O2 on RA

## 2019-03-02 NOTE — ED Notes (Signed)
Dr. Cathlean Sauer made aware of potassium of over 7.5. New orders for kayexelate.

## 2019-03-02 NOTE — Progress Notes (Signed)
Chemistry with sodium 135, potassium 7.5 chloride 111, bicarb 10, glucose 77, BUN 13, creatinine 15.4, BNP 1671.  Will proceed with 1 amp of IV calcium gluconate, 1 amp of D50 and 10 units of IV insulin (aspart).  2 doses of Kayexalate 30 g first dose now. Continue telemetry monitoring.  Patient will have urgent dialysis today.

## 2019-03-02 NOTE — Progress Notes (Signed)
Patient with ESRD on MWF HD last HD 2/26 at [facility].  Labs drawn that day K 6.5 hgb 10  Rand nearly full treatment but net UF only 1.7 with post wt 87.3.  She had gotten to her EDW at her 2/24 treatment.  She has a left upper  AVF but refuses use due to "it keeps infiltrating" and we have been using her TDC. She is awaiting an appointment at [facility] for further evaluation.  She presented today due to worsening SOB.   Dialysis order:  MWF [facility] 4 hr EDW 85.5 2 K 2 Ca profile 2 TDC and AVF heparin 4000 Mircera 75 just increased from 50 given 2/25.  Calcitriol just d/c due to high corrected Ca > 11.  Staff having difficulty obtaining labs: plan draw labs on HD today unless MD can get a vein. CXR ordered due to SOB Sats on on supplemental O2.  Missed last Friday and Monday due to visit to [place].  Dialysis staff had tried to arrange outpatient HD while she was there but refused by [place] clinic due to history of hyperkalemia and noncompliance with dialysis. Currently O2 sats 96% and she has taken O2 off. No SOB at rest for the moment. She is being admitted to OBS status. Plan HD this evening.  Reassess in the am.  She could be discharged in the am to run her usual Wed 2nd shift dialysis spot if medically stable.  Amalia Hailey, PA-C [facility]

## 2019-03-03 ENCOUNTER — Emergency Department (HOSPITAL_COMMUNITY)
Admission: EM | Admit: 2019-03-03 | Discharge: 2019-03-03 | Disposition: A | Discharge status: Home / Self Care | Payer: Medicare Other | Source: Home / Self Care | Attending: Emergency Medicine | Admitting: Emergency Medicine

## 2019-03-03 ENCOUNTER — Other Ambulatory Visit: Payer: Self-pay

## 2019-03-03 ENCOUNTER — Emergency Department (HOSPITAL_COMMUNITY): Payer: Medicare Other

## 2019-03-03 ENCOUNTER — Encounter (HOSPITAL_COMMUNITY): Payer: Self-pay | Admitting: Emergency Medicine

## 2019-03-03 DIAGNOSIS — I959 Hypotension, unspecified: Secondary | ICD-10-CM | POA: Diagnosis not present

## 2019-03-03 DIAGNOSIS — E8779 Other fluid overload: Secondary | ICD-10-CM | POA: Diagnosis not present

## 2019-03-03 DIAGNOSIS — R0602 Shortness of breath: Secondary | ICD-10-CM | POA: Diagnosis present

## 2019-03-03 DIAGNOSIS — J449 Chronic obstructive pulmonary disease, unspecified: Secondary | ICD-10-CM

## 2019-03-03 DIAGNOSIS — I132 Hypertensive heart and chronic kidney disease with heart failure and with stage 5 chronic kidney disease, or end stage renal disease: Secondary | ICD-10-CM

## 2019-03-03 DIAGNOSIS — Z23 Encounter for immunization: Secondary | ICD-10-CM | POA: Diagnosis not present

## 2019-03-03 DIAGNOSIS — R05 Cough: Secondary | ICD-10-CM | POA: Diagnosis not present

## 2019-03-03 DIAGNOSIS — Z79899 Other long term (current) drug therapy: Secondary | ICD-10-CM | POA: Insufficient documentation

## 2019-03-03 DIAGNOSIS — D631 Anemia in chronic kidney disease: Secondary | ICD-10-CM | POA: Diagnosis not present

## 2019-03-03 DIAGNOSIS — I4581 Long QT syndrome: Secondary | ICD-10-CM | POA: Diagnosis not present

## 2019-03-03 DIAGNOSIS — I5032 Chronic diastolic (congestive) heart failure: Secondary | ICD-10-CM | POA: Insufficient documentation

## 2019-03-03 DIAGNOSIS — E8770 Fluid overload, unspecified: Secondary | ICD-10-CM | POA: Diagnosis not present

## 2019-03-03 DIAGNOSIS — N2581 Secondary hyperparathyroidism of renal origin: Secondary | ICD-10-CM | POA: Diagnosis not present

## 2019-03-03 DIAGNOSIS — N186 End stage renal disease: Secondary | ICD-10-CM

## 2019-03-03 DIAGNOSIS — Z86718 Personal history of other venous thrombosis and embolism: Secondary | ICD-10-CM | POA: Diagnosis not present

## 2019-03-03 DIAGNOSIS — R079 Chest pain, unspecified: Secondary | ICD-10-CM | POA: Diagnosis not present

## 2019-03-03 DIAGNOSIS — E875 Hyperkalemia: Secondary | ICD-10-CM | POA: Diagnosis not present

## 2019-03-03 DIAGNOSIS — F17210 Nicotine dependence, cigarettes, uncomplicated: Secondary | ICD-10-CM

## 2019-03-03 DIAGNOSIS — R0789 Other chest pain: Secondary | ICD-10-CM | POA: Insufficient documentation

## 2019-03-03 HISTORY — DX: Shortness of breath: R06.02

## 2019-03-03 LAB — POCT I-STAT EG7
Acid-Base Excess: 3 mmol/L — ABNORMAL HIGH (ref 0.0–2.0)
Acid-base deficit: 14 mmol/L — ABNORMAL HIGH (ref 0.0–2.0)
Bicarbonate: 25.5 mmol/L (ref 20.0–28.0)
Bicarbonate: 12.1 mmol/L — ABNORMAL LOW (ref 20.0–28.0)
Calcium, Ion: 1.06 mmol/L — ABNORMAL LOW (ref 1.15–1.40)
Calcium, Ion: 1.38 mmol/L (ref 1.15–1.40)
HCT: 32 % — ABNORMAL LOW (ref 36.0–46.0)
HCT: 39 % (ref 36.0–46.0)
Hemoglobin: 10.9 g/dL — ABNORMAL LOW (ref 12.0–15.0)
Hemoglobin: 13.3 g/dL (ref 12.0–15.0)
O2 Saturation: 39 %
O2 Saturation: 61 %
Potassium: 3.2 mmol/L — ABNORMAL LOW (ref 3.5–5.1)
Potassium: 6.5 mmol/L (ref 3.5–5.1)
Sodium: 134 mmol/L — ABNORMAL LOW (ref 135–145)
Sodium: 135 mmol/L (ref 135–145)
TCO2: 26 mmol/L (ref 22–32)
TCO2: 13 mmol/L — ABNORMAL LOW (ref 22–32)
pCO2, Ven: 31.8 mmHg — ABNORMAL LOW (ref 44.0–60.0)
pCO2, Ven: 27.9 mmHg — ABNORMAL LOW (ref 44.0–60.0)
pH, Ven: 7.512 — ABNORMAL HIGH (ref 7.250–7.430)
pH, Ven: 7.247 — ABNORMAL LOW (ref 7.250–7.430)
pO2, Ven: 20 mmHg — CL (ref 32.0–45.0)
pO2, Ven: 36 mmHg (ref 32.0–45.0)

## 2019-03-03 LAB — BASIC METABOLIC PANEL
Anion gap: 16 — ABNORMAL HIGH (ref 5–15)
BUN: 31 mg/dL — ABNORMAL HIGH (ref 8–23)
CO2: 20 mmol/L — ABNORMAL LOW (ref 22–32)
Calcium: 8.9 mg/dL (ref 8.9–10.3)
Chloride: 98 mmol/L (ref 98–111)
Creatinine, Ser: 6.4 mg/dL — ABNORMAL HIGH (ref 0.44–1.00)
GFR calc Af Amer: 7 mL/min — ABNORMAL LOW (ref >60)
GFR calc non Af Amer: 6 mL/min — ABNORMAL LOW (ref >60)
Glucose, Bld: 87 mg/dL (ref 70–99)
Potassium: 4.2 mmol/L (ref 3.5–5.1)
Sodium: 134 mmol/L — ABNORMAL LOW (ref 135–145)

## 2019-03-03 LAB — PHOSPHORUS: Phosphorus: 6.8 mg/dL — ABNORMAL HIGH (ref 2.5–4.6)

## 2019-03-03 IMAGING — DX DG CHEST 2V
2 series · 2 of 2 positions shown · non-contrast
Comparison: Chest radiograph [DATE]

CLINICAL DATA: Shortness of breath. Missed dialysis. History of
COPD.

EXAM:
CHEST - 2 VIEW

[chest lat]
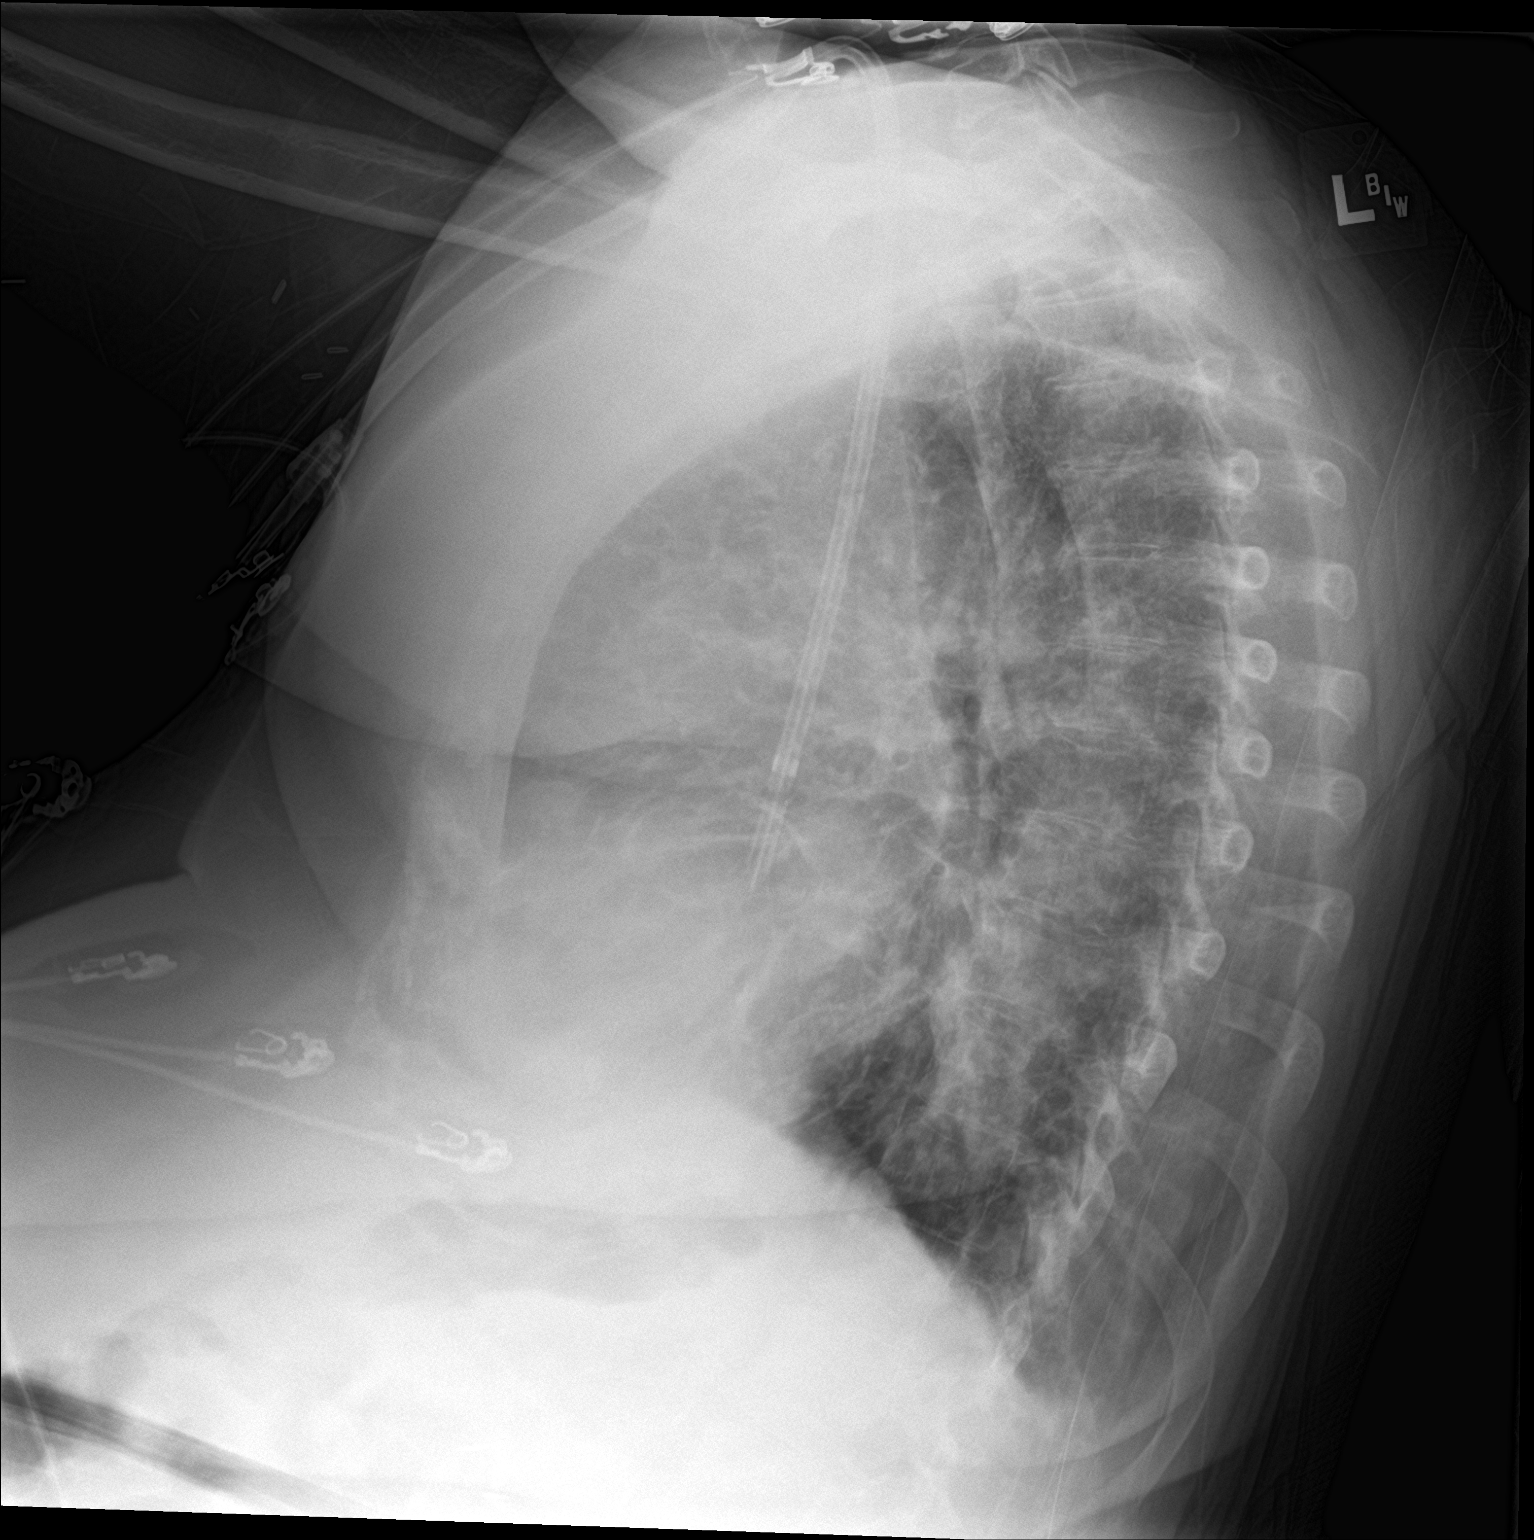

[chest ap]
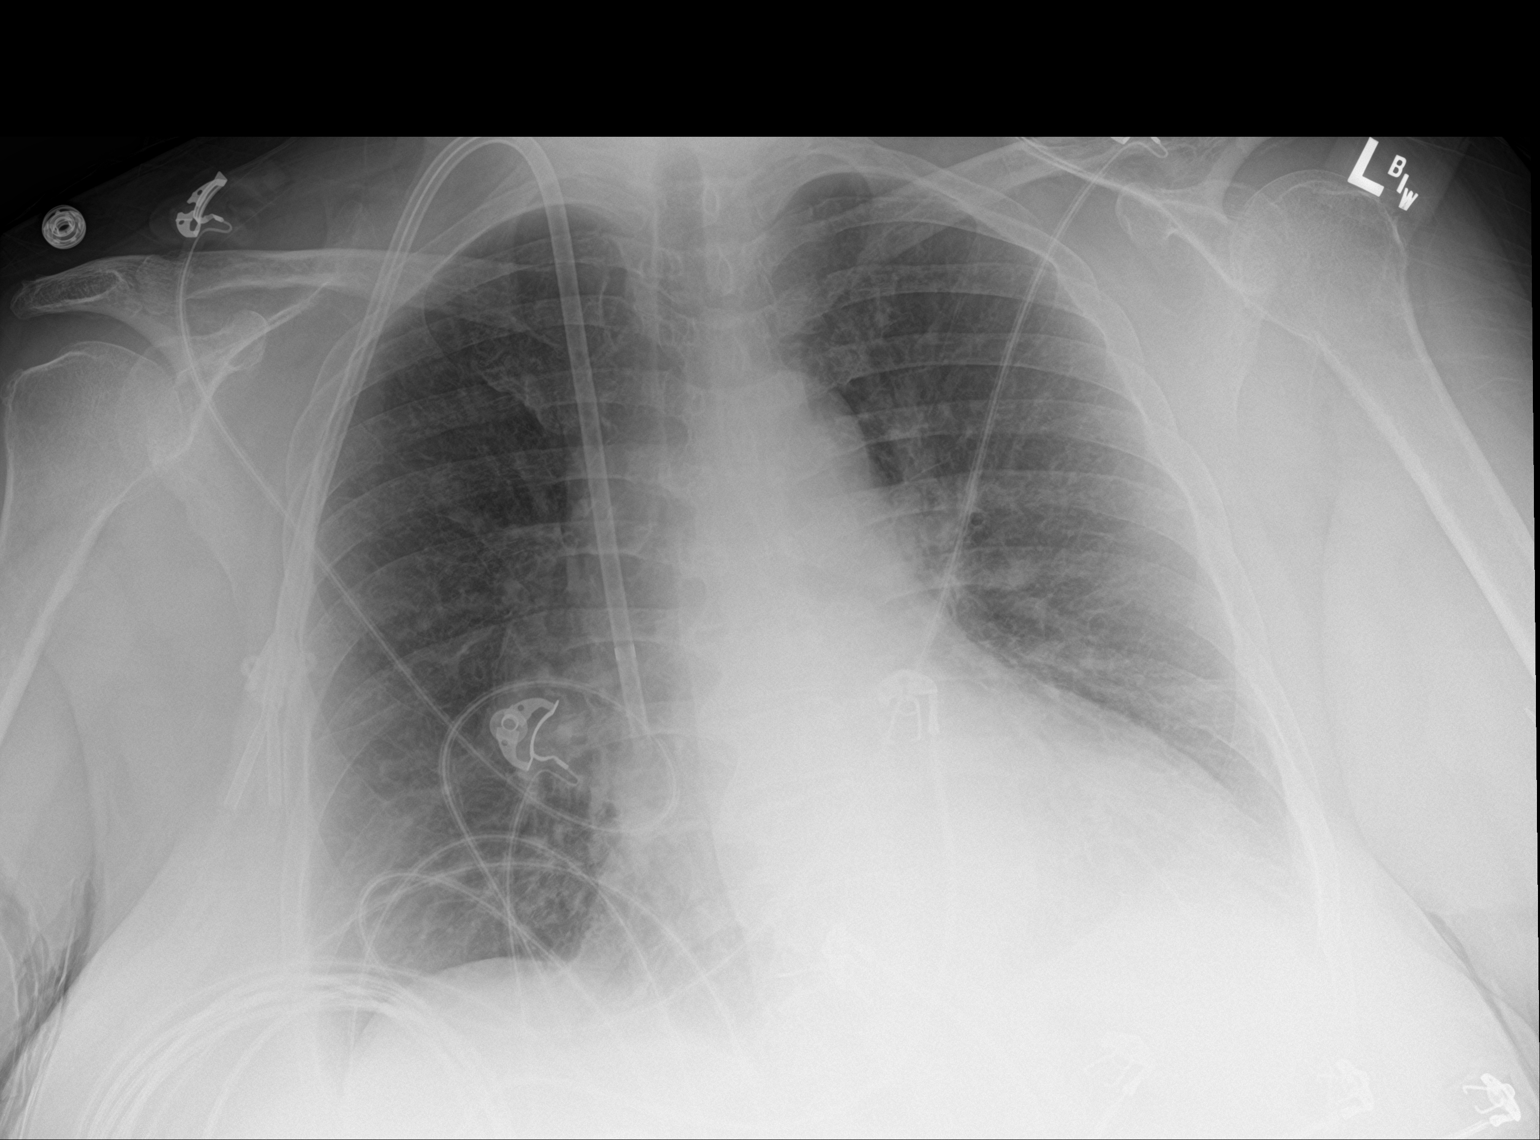

[2 of 2 positions shown; findings below may reference images not displayed]

FINDINGS: Stable cardiomegaly. Mediastinal silhouette is not suspicious.
Pulmonary vascular congestion without pleural effusion focal
consolidation. No pneumothorax. Tunneled RIGHT dialysis catheter in
situ. Soft tissue planes included osseous structures are non
suspicious.
IMPRESSION: 1. Stable cardiomegaly.  Pulmonary vascular congestion.

## 2019-03-03 IMAGING — CR DG CHEST 2V
1 series · 1 of 1 positions shown · non-contrast
Comparison: [DATE]

CLINICAL DATA: Shortness of breath, cough

EXAM:
CHEST - 2 VIEW

[chest pa]
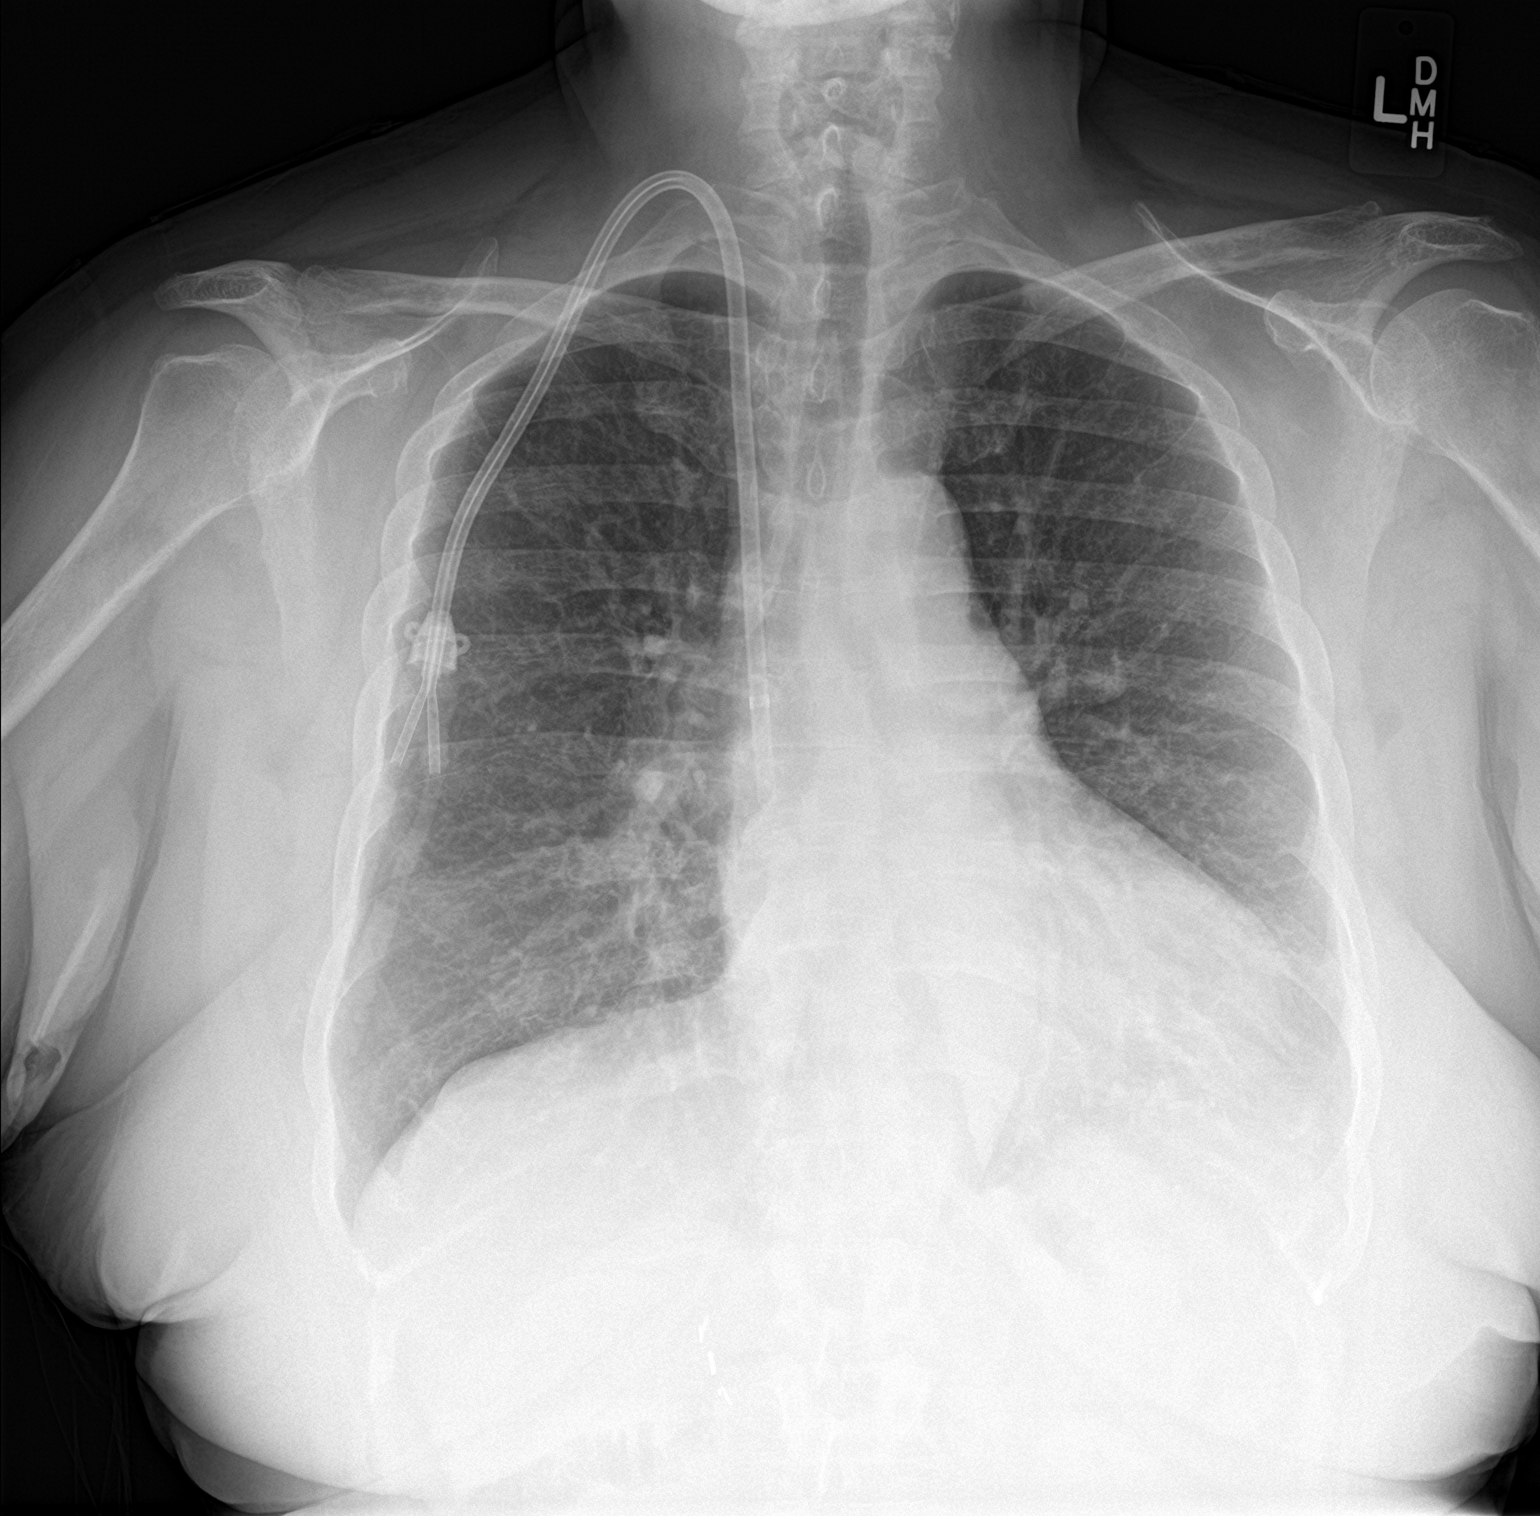

[1 of 1 positions shown; findings below may reference images not displayed]

FINDINGS: No significant change in examination with minimal diffuse
interstitial pulmonary opacity, which may reflect minimal edema or
chronic interstitial change. No new or focal airspace opacity. No
significant pleural effusions. Cardiomegaly and right chest large
bore multi lumen vascular catheter.
IMPRESSION: No significant change in examination with minimal diffuse
interstitial pulmonary opacity, which may reflect minimal edema or
chronic interstitial change. No new or focal airspace opacity. No
significant pleural effusions. Cardiomegaly and right chest large
bore multi lumen vascular catheter.

## 2019-03-03 IMAGING — CR DG CHEST 2V
1 series · 1 of 1 positions shown · non-contrast
Comparison: [DATE]

CLINICAL DATA: Shortness of breath, cough

EXAM:
CHEST - 2 VIEW

[chest lat]
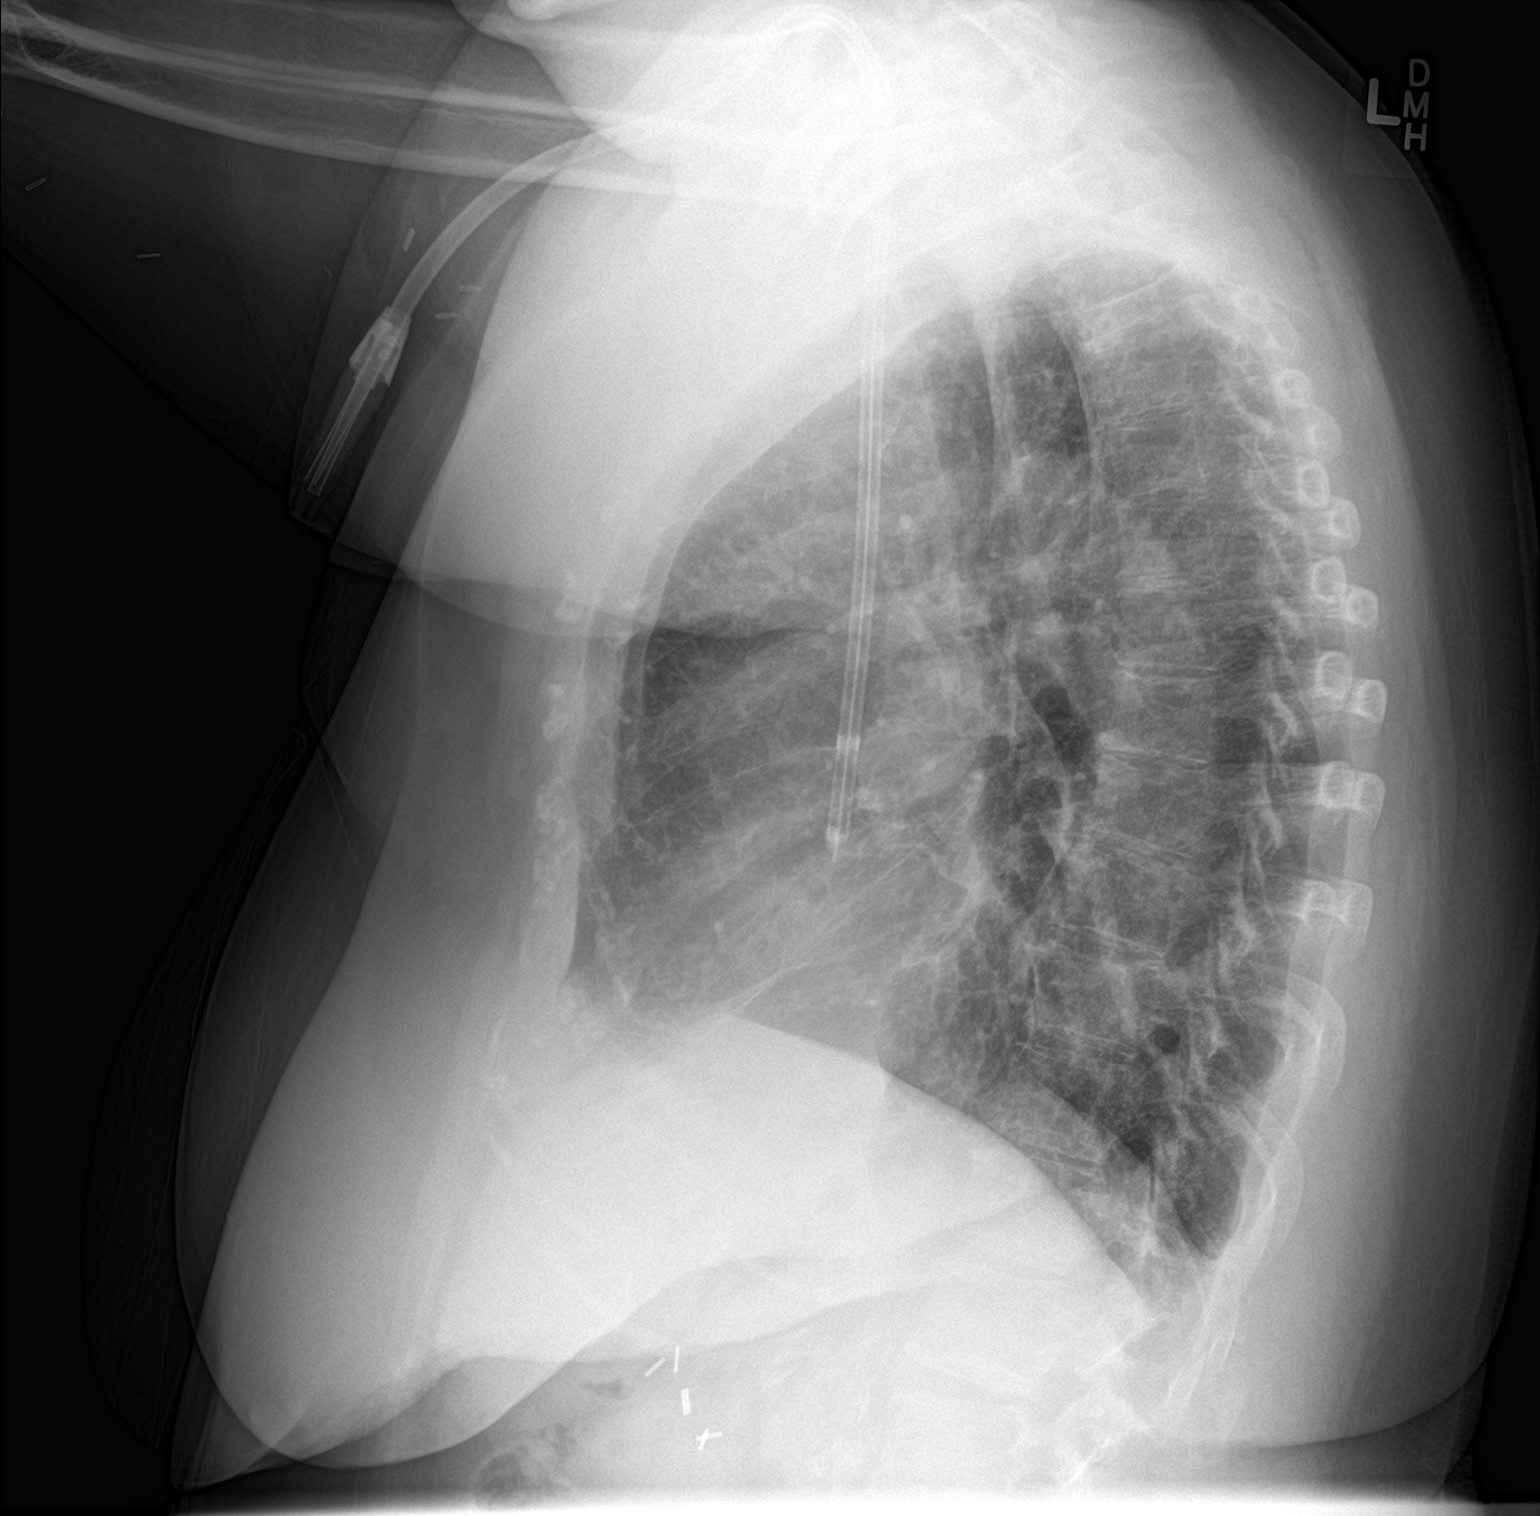

[1 of 1 positions shown; findings below may reference images not displayed]

FINDINGS: No significant change in examination with minimal diffuse
interstitial pulmonary opacity, which may reflect minimal edema or
chronic interstitial change. No new or focal airspace opacity. No
significant pleural effusions. Cardiomegaly and right chest large
bore multi lumen vascular catheter.
IMPRESSION: No significant change in examination with minimal diffuse
interstitial pulmonary opacity, which may reflect minimal edema or
chronic interstitial change. No new or focal airspace opacity. No
significant pleural effusions. Cardiomegaly and right chest large
bore multi lumen vascular catheter.

## 2019-03-03 MED ORDER — ACETAMINOPHEN 500 MG PO TABS
ORAL_TABLET | ORAL | Status: AC
  Filled 2019-03-03: qty 2

## 2019-03-03 MED ORDER — HEPARIN SODIUM (PORCINE) 1000 UNIT/ML IJ SOLN
4000.0000 [IU] | Freq: Once | INTRAMUSCULAR | Status: AC
  Administered 2019-03-03: 4000 [IU] via INTRAVENOUS

## 2019-03-03 MED ORDER — HEPARIN SODIUM (PORCINE) 1000 UNIT/ML IJ SOLN
3400.0000 [IU] | Freq: Once | INTRAMUSCULAR | Status: AC
  Administered 2019-03-03: 3400 [IU]

## 2019-03-03 MED ORDER — HEPARIN SODIUM (PORCINE) 1000 UNIT/ML IJ SOLN
INTRAMUSCULAR | Status: AC
  Filled 2019-03-03: qty 4

## 2019-03-03 MED ORDER — LORAZEPAM 2 MG/ML IJ SOLN
INTRAMUSCULAR | Status: AC
  Filled 2019-03-03: qty 1

## 2019-03-03 NOTE — ED Notes (Signed)
Pt reports getting discharged from this facility this morning and going to dialysis. Pt reports she came in earlier for the same thing; SOB and chest pain. Pt reports she started to have chest pain during dialysis and after same was completed she is pain free.

## 2019-03-03 NOTE — Progress Notes (Signed)
Hemodialysis RN,Bo made aware that patient was given kayexalate in the ER prior to patient going to hemodialysis. Sueann Brownley, Wonda Cheng, Therapist, sports

## 2019-03-03 NOTE — Progress Notes (Signed)
Spoke with Dr. Erlinda Hong and patient this am.  Patient is reluctantly agreeable to going to dialysis today.  Patient has expressed some interest in transferring back to [place].  Will ask outpatient SW to discuss this further with her.  Dr. Joelyn Oms, her nephrologist will see her at her dialysis unit today.    K down to 4.2 drawn about 1.5 hours after completion of dialysis this am.  Likely has equilibrated more and is a little higher.  hgb 10.9. Net UF 2.5 L  Amalia Hailey, PA-C

## 2019-03-03 NOTE — Progress Notes (Signed)
Patient to discharge to outpatient dialysis center. Patient given cab  Voucher. Patient educated of the necessity to attend her outpatient treatment as scheduled at the [facility]. Patient and daughter educated by phone of the importance of patient's attendance at this treatment . IV removed. Dorthey Sawyer, RN

## 2019-03-03 NOTE — ED Provider Notes (Cosign Needed)
[facility] EMERGENCY DEPARTMENT Provider Note   CSN: [id number] Arrival date & time: 03/03/19  1340   History   Chief Complaint Chief Complaint  Patient presents with  . Chest Pain    HPI Whitney Walker is a 63 y.o. female.     HPI    63 year old female with a medical history significant for end-stage renal disease on hemodialysis, Monday Wednesday and Friday, diastolic heart failure, COPD, history DVT PE presents today with complaints of chest tightness.  Patient notes she was most recently seen yesterday for shortness of breath.  She was admitted to the hospital, she received dialysis in the hospital and was discharged for outpatient dialysis.  She went to dialysis today and while receiving her dialysis she developed chest tightness and minor shortness of breath.  She notes this has happened previously.  She is uncertain how long the symptoms lasted but notes that she was at dialysis from 11-12 o'clock today roughly.  She notes she is no longer having symptoms.  She denies any ACS history.  No edema in her lower extremities.  Her main complaint today is restless leg syndrome for which she has an outpatient appointment with neurology and has tried several medications that have been unsuccessful.      Past Medical History:  Diagnosis Date  . Anemia   . Anxiety   . CHF (congestive heart failure) ([place])   . Colon cancer [facility])    treatment surgery  . Complication of anesthesia    after first C- [place] "couldnt walk after", patient denies having a spinal  . COPD (chronic obstructive pulmonary disease) ([place])   . Coronary artery disease   . Depression   . DVT (deep venous thrombosis) ([place])   . ESRD (end stage renal disease) ([place])    Hemo: MWF  . History of blood transfusion 04/2018  . Hypertension    07/07/18- no longer takes BP medications  . Meningitis   . PE (pulmonary embolism)   . Peripheral vascular disease ([place])   . Restless legs   . Shortness of  breath    with exertion  . Sleep apnea   . Vertigo     Patient Active Problem List   Diagnosis Date Noted  . SOB (shortness of breath) 03/03/2019  . Volume overload 03/02/2019  . Dizziness 09/19/2018  . Restless leg syndrome 09/19/2018  . Hyperkalemia 04/29/2018  . OSA (obstructive sleep apnea) 04/29/2018  . ESRD (end stage renal disease) ([place]) 04/29/2018  . Dyspnea on exertion 03/12/2017  . Morbid (severe) obesity due to excess calories ([place]) 03/12/2017  . Chest pain 03/02/2017  . Anemia of chronic kidney failure, stage 3 (moderate) ([place]) 12/10/2016  . History of colon cancer 08/12/2016  . Mass of left breast on mammogram 12/10/2015  . MDD (major depressive disorder), recurrent severe, without psychosis ([place]) 10/19/2015  . HTN (hypertension) 10/18/2015  . DVT (deep venous thrombosis) ([place]) 10/18/2015  . Depression 10/18/2015  . Pain, joint, lower leg, left 01/24/2015  . Numbness and tingling of left arm and leg 01/24/2015  . Aftercare following surgery of the circulatory system, [place] 07/19/2014  . PVD (peripheral vascular disease) with claudication ([place]) 03/23/2014  . Swelling of limb-Left leg 03/23/2014  . Weakness-Left arm/Leg 03/23/2014  . PAD (peripheral artery disease) ([place]) 12/14/2013  . CKD (chronic kidney disease) stage 4, GFR 15-29 ml/min (HCC) 11/15/2013  . Chronic diastolic heart failure ([place]) 11/15/2013  . Essential hypertension 11/15/2013  . Pulmonary embolism ([place]) 11/14/2013  .  Pain in limb 07/30/2013  . Atherosclerosis of native arteries of the extremities with ulceration(440.23) 07/30/2013  . Cirrhosis ([place]) 05/21/2012  . Leucocytosis 04/28/2012  . Colon cancer ([place]) 04/28/2012    Past Surgical History:  Procedure Laterality Date  . ABDOMINAL AORTAGRAM N/A 07/26/2013   Procedure: ABDOMINAL Maxcine Ham;  Surgeon: [name] Como, MD;  Location: [facility] CATH LAB;  Service: Cardiovascular;  Laterality: N/A;  . ABDOMINAL HYSTERECTOMY    . AV FISTULA PLACEMENT Left  05/05/2018   Procedure: ARTERIOVENOUS (AV) FISTULA CREATION BRACHIOCEPHALIC;  Surgeon: Waynetta Sandy, MD;  Location: [facility];  Service: Vascular;  Laterality: Left;  . AV FISTULA PLACEMENT Left 07/16/2018   Procedure: ARTERIOVENOUS FISTULA CREATION;  Surgeon: Waynetta Sandy, MD;  Location: [facility];  Service: Vascular;  Laterality: Left;  . BASCILIC VEIN TRANSPOSITION Left 09/03/2018   Procedure: BASILIC VEIN TRANSPOSITION SECOND STAGE;  Surgeon: Waynetta Sandy, MD;  Location: [facility];  Service: Vascular;  Laterality: Left;  . BREAST BIOPSY Left   . CESAREAN SECTION     X 3 [id number]  . CHOLECYSTECTOMY    . CHOLECYSTECTOMY  1980/s  . COLECTOMY  2010  . DIVERTICULOSIS SURGERY-2002  2012  . FEMORAL-POPLITEAL BYPASS GRAFT Left 08/13/2013   Procedure: BYPASS GRAFT FEMORAL-POPLITEAL ARTERY WITH NON-REVERSED SAPHANEOUS VEIN; ULTRASOUND GUIDED;  Surgeon: Mal [name], MD;  Location: [facility];  Service: Vascular;  Laterality: Left;  . INSERTION OF DIALYSIS CATHETER Right 04/30/2018   Procedure: INSERTION OF Right Internal Jugular DIALYSIS CATHETER;  Surgeon: [name], MD;  Location: [facility];  Service: Vascular;  Laterality: Right;  . LOWER EXTREMITY ANGIOGRAM Left 07/26/2013   Procedure: LOWER EXTREMITY ANGIOGRAM;  Surgeon: [name] , MD;  Location: [facility] CATH LAB;  Service: Cardiovascular;  Laterality: Left;     OB History   No obstetric history on file.      Home Medications    Prior to Admission medications   Medication Sig Start Date End Date Taking? Authorizing Provider  acetaminophen (TYLENOL) 500 MG tablet Take 1,000 mg by mouth 2 (two) times daily as needed for moderate pain or headache.    [provider]  albuterol (PROVENTIL HFA;VENTOLIN HFA) 108 (90 Base) MCG/ACT inhaler Inhale 2 puffs into the lungs every 6 (six) hours as needed for wheezing or shortness of breath.     [provider]  b complex-vitamin c-folic acid (NEPHRO-VITE) 0.8 MG  TABS tablet Take 1 tablet by mouth at bedtime.    [provider]  Biotin 5000 MCG TABS Take 1 tablet by mouth daily.    [provider]  ELIQUIS 2.5 MG TABS tablet TAKE 1 TABLET(2.5 MG) BY MOUTH TWICE DAILY Patient taking differently: Take 2.5 mg by mouth 2 (two) times daily.  02/01/19   Truitt Merle, MD  ferric citrate (AURYXIA) 1 GM 210 MG(Fe) tablet Take 210-420 mg by mouth See admin instructions. Take 210-420 mg with each meal (based on size of meal) and 210 mg with each snack     [provider]  meclizine (ANTIVERT) 12.5 MG tablet Take 12.5 mg by mouth 3 (three) times daily as needed for dizziness.    [provider]  mupirocin ointment (BACTROBAN) 2 % Apply 1 application topically daily as needed (skin bumps).     [provider]  nebivolol (BYSTOLIC) 10 MG tablet Take 10 mg by mouth daily.    [provider]  NYSTATIN powder Apply 1 Bottle topically 2 (two) times daily as needed (dryness).  05/23/17   [provider]  pramipexole (MIRAPEX) 0.25 MG tablet Take 0.25-0.5 mg by mouth See admin instructions. Take 0.25mg  to 0.5mg  by mouth 2-3 hours before bedtime and 0.25mg  prior to dialysis.    [provider]    Family History Family History  Problem Relation Age of Onset  . Cancer Mother 71       breast and bone  . Cancer Father 37       prostate  . Hypertension Sister   . Bleeding Disorder Sister   . Cancer Cousin 20       breast cancer   . Hypertension Daughter   . Breast cancer Neg Hx     Social History Social History   Tobacco Use  . Smoking status: Current Some Day Smoker    Packs/day: 1.00    Years: 40.00    Pack years: 40.00    Types: Cigarettes  . Smokeless tobacco: Former Systems developer  . Tobacco comment: less than 1 pack  Substance Use Topics  . Alcohol use: No    Alcohol/week: 0.0 standard drinks    Comment: she used to drink alcohol, quit in 2010   . Drug use: No     Allergies   Gadolinium  derivatives; Iohexol; and Iodine   Review of Systems Review of Systems  All other systems reviewed and are negative.   Physical Exam Updated Vital Signs BP 140/62   Pulse 90   Temp 98.2 F (36.8 C) (Oral)   Resp 18   SpO2 95%   Physical Exam Vitals signs and nursing note reviewed.  Constitutional:      Appearance: She is well-developed.  HENT:     Head: Normocephalic and atraumatic.  Eyes:     General: No scleral icterus.       Right eye: No discharge.        Left eye: No discharge.     Conjunctiva/sclera: Conjunctivae normal.     Pupils: Pupils are equal, round, and reactive to light.  Neck:     Musculoskeletal: Normal range of motion.     Vascular: No JVD.     Trachea: No tracheal deviation.  Cardiovascular:     Comments: Minor systolic murmur, regular rate rhythm Pulmonary:     Effort: Pulmonary effort is normal. No respiratory distress.     Breath sounds: Normal breath sounds. No stridor. No wheezing, rhonchi or rales.     Comments: Lung sounds clear Musculoskeletal:     Comments: No swelling or edema noted to the bilateral lower extremities  Neurological:     Mental Status: She is alert and oriented to person, place, and time.     Coordination: Coordination normal.  Psychiatric:        Behavior: Behavior normal.        Thought Content: Thought content normal.        Judgment: Judgment normal.      ED Treatments / Results  Labs (all labs ordered are listed, but only abnormal results are displayed) Labs Reviewed  CBC WITH DIFFERENTIAL/PLATELET  BASIC METABOLIC PANEL  I-STAT TROPONIN, ED    EKG EKG Interpretation  Date/Time:  Wednesday March 03 2019 13:45:54 EST Ventricular Rate:  90 PR Interval:    QRS Duration: 76 QT Interval:  425 QTC Calculation: 521 R Axis:   -21 Text Interpretation:  Sinus rhythm Probable left atrial enlargement Borderline left axis deviation Nonspecific T abnrm, anterolateral leads  Prolonged QT interval No significant  change was found Confirmed  by Jola Schmidt [phone]) on 03/03/2019 3:07:41 PM   Radiology Dg Chest 2 View  Result Date: 03/02/2019 CLINICAL DATA:  Shortness of breath. Missed dialysis. History of COPD. EXAM: CHEST - 2 VIEW COMPARISON:  Chest radiograph Apr 30, 2018 FINDINGS: Stable cardiomegaly. Mediastinal silhouette is not suspicious. Pulmonary vascular congestion without pleural effusion focal consolidation. No pneumothorax. Tunneled RIGHT dialysis catheter in situ. Soft tissue planes included osseous structures are non suspicious. IMPRESSION: 1. Stable cardiomegaly.  Pulmonary vascular congestion. Electronically Signed   By: Elon Alas M.D.   On: 03/02/2019 18:12    Procedures Procedures (including critical care time)  Medications Ordered in ED Medications - No data to display   Initial Impression / Assessment and Plan / ED Course  I have reviewed the triage vital signs and the nursing notes.  Pertinent labs & imaging results that were available during my care of the patient were reviewed by me and considered in my medical decision making (see chart for details).        Labs: CBC, BMP, i-STAT troponin  Imaging: DG chest 2 view  Consults:  Therapeutics:  Discharge Meds:   Assessment/Plan: 63 year old female presents today with complaints of chest tightness.  [name] main complaint is her chronic restless leg.  She notes she had tightness and some shortness of breath while receiving dialysis.  She is back to baseline presently.  I do find it appropriate to have initial screening with chest x-ray i-STAT troponin.  Given patient's complete resolutions, history of the same if no significant abnormalities that I would feel safe with patient being discharged with outpatient follow-up.  She has no signs of PE, low suspicion for ACS.  Patient care signed to oncoming provider pending laboratory analysis and disposition.    Final Clinical Impressions(s) / ED Diagnoses   Final  diagnoses:  Chest tightness    ED Discharge Orders    None       Okey Regal, PA-C 03/03/19 1552

## 2019-03-03 NOTE — Progress Notes (Signed)
03/03/2019 9:10 AM  Spoke to [employer] this morning. Confirmed patient to discharge and be transported to [facility] by [place] will pick her up from center, per usual routine.   Julia Kulzer MSN, RN-BC, CNML [facility] Phone: [phone]

## 2019-03-03 NOTE — ED Triage Notes (Signed)
Patient arrived from Dialysis via [facility] with reports of chest pain and shortness of breath. Dialysis was completed. Patient discharged from [facility] ED this morning.

## 2019-03-03 NOTE — ED Notes (Addendum)
Pt seen leaving department by this RN. Pt stopped as she entered the lobby and questioned about leaving. Pt stated she did not want to wait any longer and wanted to leave. Pt was able to ambulate from room to lobby without complication. Armband removed from pt prior to her leaving. No IV in place. EDP notified ASAP.

## 2019-03-03 NOTE — ED Provider Notes (Signed)
Whitney Walker is a 63 y.o. female, presenting to the ED with an episode of chest tightness and shortness of breath that occurred during dialysis today.  This lasted for a few minutes and resolved once the dialysis session was stopped.  She tells me she has these instances of chest tightness and shortness of breath during her dialysis sessions frequently.  She did not have any complaints upon my initial interview with her.  HPI from Lenn Sink, PA-C: "63 year old female with a medical history significant for end-stage renal disease on hemodialysis, Monday Wednesday and Friday, diastolic heart failure, COPD, history DVT PE presents today with complaints of chest tightness.  Patient notes she was most recently seen yesterday for shortness of breath.  She was admitted to the hospital, she received dialysis in the hospital and was discharged for outpatient dialysis.  She went to dialysis today and while receiving her dialysis she developed chest tightness and minor shortness of breath.  She notes this has happened previously.  She is uncertain how long the symptoms lasted but notes that she was at dialysis from 11-12 o'clock today roughly.  She notes she is no longer having symptoms.  She denies any ACS history.  No edema in her lower extremities.  Her main complaint today is restless leg syndrome for which she has an outpatient appointment with neurology and has tried several medications that have been unsuccessful."  Past Medical History:  Diagnosis Date  . Anemia   . Anxiety   . CHF (congestive heart failure) ([place])   . Colon cancer [facility])    treatment surgery  . Complication of anesthesia    after first C- [place] "couldnt walk after", patient denies having a spinal  . COPD (chronic obstructive pulmonary disease) ([place])   . Coronary artery disease   . Depression   . DVT (deep venous thrombosis) ([place])   . ESRD (end stage renal disease) ([place])    Hemo: MWF  . History of blood transfusion 04/2018  .  Hypertension    07/07/18- no longer takes BP medications  . Meningitis   . PE (pulmonary embolism)   . Peripheral vascular disease ([place])   . Restless legs   . Shortness of breath    with exertion  . Sleep apnea   . Vertigo     Physical Exam  BP 135/80   Pulse 87   Temp 98.2 F (36.8 C) (Oral)   Resp 13   SpO2 97%   Physical Exam Vitals signs and nursing note reviewed.  Constitutional:      General: She is not in acute distress.    Appearance: She is well-developed. She is not diaphoretic.  HENT:     Head: Normocephalic and atraumatic.     Mouth/Throat:     Mouth: Mucous membranes are moist.     Pharynx: Oropharynx is clear.  Eyes:     Conjunctiva/sclera: Conjunctivae normal.  Neck:     Musculoskeletal: Neck supple.  Cardiovascular:     Rate and Rhythm: Normal rate and regular rhythm.     Pulses: Normal pulses.     Heart sounds: Normal heart sounds.     Comments: Tactile temperature in the extremities appropriate and equal bilaterally. Pulmonary:     Effort: Pulmonary effort is normal. No respiratory distress.     Breath sounds: Normal breath sounds.  Abdominal:     Palpations: Abdomen is soft.     Tenderness: There is no abdominal tenderness. There is no guarding.  Musculoskeletal:  Right lower leg: No edema.     Left lower leg: No edema.  Lymphadenopathy:     Cervical: No cervical adenopathy.  Skin:    General: Skin is warm and dry.  Neurological:     Mental Status: She is alert.  Psychiatric:        Mood and Affect: Mood and affect normal.        Speech: Speech normal.        Behavior: Behavior normal.     ED Course/Procedures    Ultrasound ED Peripheral IV (Provider) Date/Time: 03/03/2019 5:25 PM Performed by: Lorayne Bender, PA-C Authorized by: Lorayne Bender, PA-C   Procedure details:    Indications: multiple failed IV attempts and poor IV access     Skin Prep: chlorhexidine gluconate     Location:  Right AC   Angiocath:  20 G   Bedside  Ultrasound Guided: Yes     Images: archived     Patient tolerated procedure without complications: Yes     Dressing applied: Yes   Comments:     Positive flash, easily threaded catheter, but IV infiltrated.    Abnormal Labs Reviewed - No abnormal labs to display   Dg Chest 2 View  Result Date: 03/02/2019 CLINICAL DATA:  Shortness of breath. Missed dialysis. History of COPD. EXAM: CHEST - 2 VIEW COMPARISON:  Chest radiograph Apr 30, 2018 FINDINGS: Stable cardiomegaly. Mediastinal silhouette is not suspicious. Pulmonary vascular congestion without pleural effusion focal consolidation. No pneumothorax. Tunneled RIGHT dialysis catheter in situ. Soft tissue planes included osseous structures are non suspicious. IMPRESSION: 1. Stable cardiomegaly.  Pulmonary vascular congestion. Electronically Signed   By: Elon Alas M.D.   On: 03/02/2019 18:12    MDM      Patient care handoff report received from [facility], PA-C. Plan: Review lab work and chest x-ray, if unremarkable, discharged home with office follow-up.  Patient had no complaints during my time with her.  Patient is nontoxic appearing, afebrile, not tachycardic, not tachypneic, not hypotensive, maintains excellent SPO2 on room air, and is in no apparent distress.  Early after I assumed care of the patient, she stated she did not think she needed to be here.  We had difficulty getting blood drawn from the patient due to difficult access. Patient eventually decided she did not need to be in the ED any longer.  She states she will follow-up with her doctor.  I was preparing for an [place] discharge and told the patient she is encouraged to return should she change her mind and she will be welcomed back.  She voiced understanding, but as I was preparing her paperwork for discharge, the patient eloped.    Vitals:   03/03/19 1415 03/03/19 1430 03/03/19 1445 03/03/19 1500  BP: (!) 136/56 (!) 145/60 (!) 146/63 135/80  Pulse: 85 86 85 87   Resp: 15 14 19 13   Temp:      TempSrc:      SpO2: 91% 97% 100% 97%      Lorayne Bender, PA-C 03/03/19 1750    Veryl Speak, MD 03/04/19 2301

## 2019-03-03 NOTE — Discharge Summary (Signed)
Discharge Summary  Whitney Walker RDE:[id number] DOB: 03/22/1956  PCP: Glendale Chard, MD  Admit date: 03/02/2019 Discharge date: 03/03/2019  Time spent: 37mins, case discussed with nephrology  Recommendations for Outpatient Follow-up:  1. F/u with PCP within a week  for hospital discharge follow up 2. F/u with nephrology, back on regular dialysis schedule MWF  Discharge Diagnoses:  Active Hospital Problems   Diagnosis Date Noted  . SOB (shortness of breath) 03/03/2019  . Volume overload 03/02/2019  . ESRD (end stage renal disease) ([facility]) 04/29/2018  . HTN (hypertension) 10/18/2015  . DVT (deep venous thrombosis) ([facility]) 10/18/2015  . Chronic diastolic heart failure ([facility]) 11/15/2013  . Essential hypertension 11/15/2013  . Pulmonary embolism ([facility]) 11/14/2013    Resolved Hospital Problems  No resolved problems to display.    Discharge Condition: stable  Diet recommendation: renal dialysis diet  Filed Weights   03/02/19 2120 03/02/19 2340 03/03/19 0350  Weight: 84.9 kg 84.9 kg 82.4 kg    History of present illness:   PCP: Glendale Chard, MD   Patient coming from: Home   Chief Complaint: Dyspnea  HPI: Whitney Walker is a 63 y.o. female with medical history significant of end-stage renal disease on hemodialysis, (Monday, Wednesday and Friday), diastolic heart failure, COPD, history of DVT/PE who presents with worsening dyspnea for the last 24 hours.  Her last hemodialysis was February 24, 2019, she went to [place], and she missed 2 dialysis sessions.  She was told that while being away to go to the emergency department in case she experienced signs of dyspnea or volume overload.  She mentioned having no symptoms until last night when she started developing worsening dyspnea, nausea, and generalized malaise.  Her symptoms were moderate to severe in intensity, persistent in nature, no improving or worsening factors, no associated fevers or chills.  Patient is still able to  make urine.   ED Course: Patient was found volume overloaded, hypertensive, she received hydralazine, albuterol nephrology was consulted.  The plan is to undergo urgent dialysis today.  Hospital Course:  Active Problems:   Pulmonary embolism (HCC)   Chronic diastolic heart failure (HCC)   Essential hypertension   HTN (hypertension)   DVT (deep venous thrombosis) (HCC)   ESRD (end stage renal disease) (HCC)   Volume overload   SOB (shortness of breath)  63 year old female with end-stage renal disease on hemodialysis, (M-W-F), who has missed 2 sessions of hemodialysis, has developed acute dyspnea over the last 24 hours, associated generalized malaise and nausea.  She does have a right intrajugular vein HD catheter for hemodialysis access, along with AV fistula in her left upper extremity.  On the initial physical examination her blood pressure is 245/120, heart rate 75, respiratory rate 22, oxygen saturation 99% on supplemental oxygen per nasal cannula. Her potassium is 7.5 on presentation, she underwent urgent dialysis, potassium normalized, blood pressure normalized, nephrology cleared her to discharge from the hospital and get back on her regular dialysis schedule MWF, start from today at 11 am.   Anemia of chronic disease, hgb around 10-11, per nephrology  H/o diastolic CHF, volume managed by dialysis, euvolemic at discharge  Other medical issues H/o DVT/PEon apixban  H/o COPD, stable, no wheezing, no hypoxia at discharge  Obesity with OSA, reports has cpap at home but does not use it Body mass index is 34.32 kg/m.   Procedures:  Urgent dialysis  Consultations:  Nephrology   Discharge Exam: BP 120/60 (BP Location: Right Arm)   Pulse 80  Temp 98.4 F (36.9 C) (Oral)   Resp 18   Ht 5\' 1"  (1.549 m)   Wt 82.4 kg   SpO2 94%   BMI 34.32 kg/m   General: NAD Cardiovascular: RRR Respiratory: CTABL  Discharge Instructions You were cared for by a hospitalist during  your hospital stay. If you have any questions about your discharge medications or the care you received while you were in the hospital after you are discharged, you can call the unit and asked to speak with the hospitalist on call if the hospitalist that took care of you is not available. Once you are discharged, your primary care physician will handle any further medical issues. Please note that NO REFILLS for any discharge medications will be authorized once you are discharged, as it is imperative that you return to your primary care physician (or establish a relationship with a primary care physician if you do not have one) for your aftercare needs so that they can reassess your need for medications and monitor your lab values.  Discharge Instructions    Diet general   Complete by:  As directed    Dialysis diet   Increase activity slowly   Complete by:  As directed      Allergies as of 03/03/2019      Reactions   Gadolinium Derivatives Hives, Other (See Comments)   HIVES, Desc: HIVES W/ "DYE" USED FOR 1ST CT SCAN BUT NOT 2ND, NO PREMEDS USED, PT UNCERTAIN OF CIRCUMSTANCES,,?POSSIBLE MRI CONTRAST ALLERGY, ALL STUDIES DONE "SOMEWHERE" IN [place]//A.C., Onset Date: [id number]   Iohexol Other (See Comments)    Code: HIVES, Desc: HIVES W/ "DYE" USED FOR 1ST CT SCAN BUT NOT 2ND, NO PREMEDS USED, PT UNCERTAIN OF CIRCUMSTANCES,,?POSSIBLE MRI CONTRAST ALLERGY, ALL STUDIES DONE "SOMEWHERE" IN [place]//A.C., Onset Date: [id number]   Iodine Hives      Medication List    STOP taking these medications   calcitRIOL 0.5 MCG capsule Commonly known as:  ROCALTROL   oxyCODONE-acetaminophen 5-325 MG tablet Commonly known as:  PERCOCET/ROXICET     TAKE these medications   acetaminophen 500 MG tablet Commonly known as:  TYLENOL Take 1,000 mg by mouth 2 (two) times daily as needed for moderate pain or headache.   albuterol 108 (90 Base) MCG/ACT inhaler Commonly known as:  PROVENTIL HFA;VENTOLIN  HFA Inhale 2 puffs into the lungs every 6 (six) hours as needed for wheezing or shortness of breath.   AURYXIA 1 GM 210 MG(Fe) tablet Generic drug:  ferric citrate Take 210-420 mg by mouth See admin instructions. Take 210-420 mg with each meal (based on size of meal) and 210 mg with each snack   b complex-vitamin c-folic acid 0.8 MG Tabs tablet Take 1 tablet by mouth at bedtime.   Biotin 5000 MCG Tabs Take 1 tablet by mouth daily.   ELIQUIS 2.5 MG Tabs tablet Generic drug:  apixaban TAKE 1 TABLET(2.5 MG) BY MOUTH TWICE DAILY What changed:  See the new instructions.   meclizine 12.5 MG tablet Commonly known as:  ANTIVERT Take 12.5 mg by mouth 3 (three) times daily as needed for dizziness.   mupirocin ointment 2 % Commonly known as:  BACTROBAN Apply 1 application topically daily as needed (skin bumps).   nebivolol 10 MG tablet Commonly known as:  BYSTOLIC Take 10 mg by mouth daily.   nystatin powder Generic drug:  nystatin Apply 1 Bottle topically 2 (two) times daily as needed (dryness).   pramipexole 0.25 MG tablet Commonly known as:  MIRAPEX Take 0.25-0.5 mg by mouth See admin instructions. Take 0.25mg  to 0.5mg  by mouth 2-3 hours before bedtime and 0.25mg  prior to dialysis.      Allergies  Allergen Reactions  . Gadolinium Derivatives Hives and Other (See Comments)    HIVES, Desc: HIVES W/ "DYE" USED FOR 1ST CT SCAN BUT NOT 2ND, NO PREMEDS USED, PT UNCERTAIN OF CIRCUMSTANCES,,?POSSIBLE MRI CONTRAST ALLERGY, ALL STUDIES DONE "SOMEWHERE" IN [place]//A.C., Onset Date: [id number]  . Iohexol Other (See Comments)     Code: HIVES, Desc: HIVES W/ "DYE" USED FOR 1ST CT SCAN BUT NOT 2ND, NO PREMEDS USED, PT UNCERTAIN OF CIRCUMSTANCES,,?POSSIBLE MRI CONTRAST ALLERGY, ALL STUDIES DONE "SOMEWHERE" IN [place]//A.C., Onset Date: [id number]   . Iodine Hives   Follow-up Information    Glendale Chard, MD.   Specialty:  Internal Medicine Why:  The office will contact you at  phone number [phone] to schedule a follow up appointment. Thank you.  Contact information: [address] [place] [address] [phone]        follow up with nephrology.   Why:  As needed           The results of significant diagnostics from this hospitalization (including imaging, microbiology, ancillary and laboratory) are listed below for reference.    Significant Diagnostic Studies: Dg Chest 2 View  Result Date: 03/02/2019 CLINICAL DATA:  Shortness of breath. Missed dialysis. History of COPD. EXAM: CHEST - 2 VIEW COMPARISON:  Chest radiograph Apr 30, 2018 FINDINGS: Stable cardiomegaly. Mediastinal silhouette is not suspicious. Pulmonary vascular congestion without pleural effusion focal consolidation. No pneumothorax. Tunneled RIGHT dialysis catheter in situ. Soft tissue planes included osseous structures are non suspicious. IMPRESSION: 1. Stable cardiomegaly.  Pulmonary vascular congestion. Electronically Signed   By: Elon Alas M.D.   On: 03/02/2019 18:12    Microbiology: Recent Results (from the past 240 hour(s))  MRSA PCR Screening     Status: None   Collection Time: 03/02/19  9:20 PM  Result Value Ref Range Status   MRSA by PCR NEGATIVE NEGATIVE Final    Comment:        The GeneXpert MRSA Assay (FDA approved for NASAL specimens only), is one component of a comprehensive MRSA colonization surveillance program. It is not intended to diagnose MRSA infection nor to guide or monitor treatment for MRSA infections. Performed at [facility], [place] [address], [place], [place] [address]      Labs: Basic Metabolic Panel: Recent Labs  Lab 03/02/19 1635 03/03/19 0005 03/03/19 0335 03/03/19 0514  NA 135  --  134* 134*  K >7.5*  --  3.2* 4.2  CL 111  --   --  98  CO2 10*  --   --  20*  GLUCOSE 77  --   --  87  BUN 113*  --   --  31*  CREATININE 15.49*  --   --  6.40*  CALCIUM 9.5  --   --  8.9  PHOS  --  6.8*  --   --    Liver  Function Tests: Recent Labs  Lab 03/02/19 1635  AST 14*  ALT 18  ALKPHOS 71  BILITOT 0.7  PROT 7.6  ALBUMIN 3.1*   No results for input(s): LIPASE, AMYLASE in the last 168 hours. No results for input(s): AMMONIA in the last 168 hours. CBC: Recent Labs  Lab 03/02/19 1635 03/03/19 0335  WBC 13.0*  --   NEUTROABS 10.5*  --   HGB  9.8* 10.9*  HCT 32.7* 32.0*  MCV 93.2  --   PLT 229  --    Cardiac Enzymes: No results for input(s): CKTOTAL, CKMB, CKMBINDEX, TROPONINI in the last 168 hours. BNP: BNP (last 3 results) Recent Labs    03/02/19 1635  BNP 1,671.0*    ProBNP (last 3 results) No results for input(s): PROBNP in the last 8760 hours.  CBG: No results for input(s): GLUCAP in the last 168 hours.     Signed:  Florencia Reasons MD, PhD  [facility] 03/03/2019, 9:04 AM

## 2019-03-03 NOTE — ED Notes (Signed)
Pt seen walking out of ED with family. Ronalee Belts RN went to talk to pt.

## 2019-03-03 NOTE — Progress Notes (Signed)
PIV consult: Arrived to ED, pt had left. Cancel consult.

## 2019-03-04 LAB — HEPATITIS B SURFACE ANTIGEN: Hepatitis B Surface Ag: NEGATIVE

## 2019-03-05 DIAGNOSIS — D631 Anemia in chronic kidney disease: Secondary | ICD-10-CM | POA: Diagnosis not present

## 2019-03-05 DIAGNOSIS — N186 End stage renal disease: Secondary | ICD-10-CM | POA: Diagnosis not present

## 2019-03-05 DIAGNOSIS — E875 Hyperkalemia: Secondary | ICD-10-CM | POA: Diagnosis not present

## 2019-03-05 DIAGNOSIS — N2581 Secondary hyperparathyroidism of renal origin: Secondary | ICD-10-CM | POA: Diagnosis not present

## 2019-03-05 DIAGNOSIS — Z23 Encounter for immunization: Secondary | ICD-10-CM | POA: Diagnosis not present

## 2019-03-08 ENCOUNTER — Inpatient Hospital Stay: Payer: Medicare Other | Admitting: Nurse Practitioner

## 2019-03-08 DIAGNOSIS — N2581 Secondary hyperparathyroidism of renal origin: Secondary | ICD-10-CM | POA: Diagnosis not present

## 2019-03-08 DIAGNOSIS — N186 End stage renal disease: Secondary | ICD-10-CM | POA: Diagnosis not present

## 2019-03-08 DIAGNOSIS — Z23 Encounter for immunization: Secondary | ICD-10-CM | POA: Diagnosis not present

## 2019-03-08 DIAGNOSIS — E875 Hyperkalemia: Secondary | ICD-10-CM | POA: Diagnosis not present

## 2019-03-08 DIAGNOSIS — D631 Anemia in chronic kidney disease: Secondary | ICD-10-CM | POA: Diagnosis not present

## 2019-03-10 DIAGNOSIS — D631 Anemia in chronic kidney disease: Secondary | ICD-10-CM | POA: Diagnosis not present

## 2019-03-10 DIAGNOSIS — N2581 Secondary hyperparathyroidism of renal origin: Secondary | ICD-10-CM | POA: Diagnosis not present

## 2019-03-10 DIAGNOSIS — N186 End stage renal disease: Secondary | ICD-10-CM | POA: Diagnosis not present

## 2019-03-10 DIAGNOSIS — E875 Hyperkalemia: Secondary | ICD-10-CM | POA: Diagnosis not present

## 2019-03-10 DIAGNOSIS — Z23 Encounter for immunization: Secondary | ICD-10-CM | POA: Diagnosis not present

## 2019-03-12 DIAGNOSIS — E875 Hyperkalemia: Secondary | ICD-10-CM | POA: Diagnosis not present

## 2019-03-12 DIAGNOSIS — N186 End stage renal disease: Secondary | ICD-10-CM | POA: Diagnosis not present

## 2019-03-12 DIAGNOSIS — Z23 Encounter for immunization: Secondary | ICD-10-CM | POA: Diagnosis not present

## 2019-03-12 DIAGNOSIS — N2581 Secondary hyperparathyroidism of renal origin: Secondary | ICD-10-CM | POA: Diagnosis not present

## 2019-03-12 DIAGNOSIS — D631 Anemia in chronic kidney disease: Secondary | ICD-10-CM | POA: Diagnosis not present

## 2019-03-15 DIAGNOSIS — D631 Anemia in chronic kidney disease: Secondary | ICD-10-CM | POA: Diagnosis not present

## 2019-03-15 DIAGNOSIS — N2581 Secondary hyperparathyroidism of renal origin: Secondary | ICD-10-CM | POA: Diagnosis not present

## 2019-03-15 DIAGNOSIS — N186 End stage renal disease: Secondary | ICD-10-CM | POA: Diagnosis not present

## 2019-03-15 DIAGNOSIS — Z23 Encounter for immunization: Secondary | ICD-10-CM | POA: Diagnosis not present

## 2019-03-15 DIAGNOSIS — E875 Hyperkalemia: Secondary | ICD-10-CM | POA: Diagnosis not present

## 2019-03-16 ENCOUNTER — Inpatient Hospital Stay: Payer: Medicare Other | Admitting: Nurse Practitioner

## 2019-03-17 DIAGNOSIS — D631 Anemia in chronic kidney disease: Secondary | ICD-10-CM | POA: Diagnosis not present

## 2019-03-17 DIAGNOSIS — N2581 Secondary hyperparathyroidism of renal origin: Secondary | ICD-10-CM | POA: Diagnosis not present

## 2019-03-17 DIAGNOSIS — Z23 Encounter for immunization: Secondary | ICD-10-CM | POA: Diagnosis not present

## 2019-03-17 DIAGNOSIS — N186 End stage renal disease: Secondary | ICD-10-CM | POA: Diagnosis not present

## 2019-03-17 DIAGNOSIS — E875 Hyperkalemia: Secondary | ICD-10-CM | POA: Diagnosis not present

## 2019-03-19 DIAGNOSIS — D631 Anemia in chronic kidney disease: Secondary | ICD-10-CM | POA: Diagnosis not present

## 2019-03-19 DIAGNOSIS — N186 End stage renal disease: Secondary | ICD-10-CM | POA: Diagnosis not present

## 2019-03-19 DIAGNOSIS — E875 Hyperkalemia: Secondary | ICD-10-CM | POA: Diagnosis not present

## 2019-03-19 DIAGNOSIS — N2581 Secondary hyperparathyroidism of renal origin: Secondary | ICD-10-CM | POA: Diagnosis not present

## 2019-03-19 DIAGNOSIS — Z23 Encounter for immunization: Secondary | ICD-10-CM | POA: Diagnosis not present

## 2019-03-22 DIAGNOSIS — N2581 Secondary hyperparathyroidism of renal origin: Secondary | ICD-10-CM | POA: Diagnosis not present

## 2019-03-22 DIAGNOSIS — N186 End stage renal disease: Secondary | ICD-10-CM | POA: Diagnosis not present

## 2019-03-22 DIAGNOSIS — D631 Anemia in chronic kidney disease: Secondary | ICD-10-CM | POA: Diagnosis not present

## 2019-03-22 DIAGNOSIS — Z23 Encounter for immunization: Secondary | ICD-10-CM | POA: Diagnosis not present

## 2019-03-22 DIAGNOSIS — E875 Hyperkalemia: Secondary | ICD-10-CM | POA: Diagnosis not present

## 2019-03-24 DIAGNOSIS — E875 Hyperkalemia: Secondary | ICD-10-CM | POA: Diagnosis not present

## 2019-03-24 DIAGNOSIS — N2581 Secondary hyperparathyroidism of renal origin: Secondary | ICD-10-CM | POA: Diagnosis not present

## 2019-03-24 DIAGNOSIS — N186 End stage renal disease: Secondary | ICD-10-CM | POA: Diagnosis not present

## 2019-03-24 DIAGNOSIS — D631 Anemia in chronic kidney disease: Secondary | ICD-10-CM | POA: Diagnosis not present

## 2019-03-24 DIAGNOSIS — Z23 Encounter for immunization: Secondary | ICD-10-CM | POA: Diagnosis not present

## 2019-03-26 DIAGNOSIS — D631 Anemia in chronic kidney disease: Secondary | ICD-10-CM | POA: Diagnosis not present

## 2019-03-26 DIAGNOSIS — Z23 Encounter for immunization: Secondary | ICD-10-CM | POA: Diagnosis not present

## 2019-03-26 DIAGNOSIS — N186 End stage renal disease: Secondary | ICD-10-CM | POA: Diagnosis not present

## 2019-03-26 DIAGNOSIS — N2581 Secondary hyperparathyroidism of renal origin: Secondary | ICD-10-CM | POA: Diagnosis not present

## 2019-03-26 DIAGNOSIS — E875 Hyperkalemia: Secondary | ICD-10-CM | POA: Diagnosis not present

## 2019-03-29 DIAGNOSIS — D631 Anemia in chronic kidney disease: Secondary | ICD-10-CM | POA: Diagnosis not present

## 2019-03-29 DIAGNOSIS — E875 Hyperkalemia: Secondary | ICD-10-CM | POA: Diagnosis not present

## 2019-03-29 DIAGNOSIS — Z23 Encounter for immunization: Secondary | ICD-10-CM | POA: Diagnosis not present

## 2019-03-29 DIAGNOSIS — N186 End stage renal disease: Secondary | ICD-10-CM | POA: Diagnosis not present

## 2019-03-29 DIAGNOSIS — N2581 Secondary hyperparathyroidism of renal origin: Secondary | ICD-10-CM | POA: Diagnosis not present

## 2019-03-31 DIAGNOSIS — I129 Hypertensive chronic kidney disease with stage 1 through stage 4 chronic kidney disease, or unspecified chronic kidney disease: Secondary | ICD-10-CM | POA: Diagnosis not present

## 2019-03-31 DIAGNOSIS — N2581 Secondary hyperparathyroidism of renal origin: Secondary | ICD-10-CM | POA: Diagnosis not present

## 2019-03-31 DIAGNOSIS — E875 Hyperkalemia: Secondary | ICD-10-CM | POA: Diagnosis not present

## 2019-03-31 DIAGNOSIS — Z992 Dependence on renal dialysis: Secondary | ICD-10-CM | POA: Diagnosis not present

## 2019-03-31 DIAGNOSIS — D631 Anemia in chronic kidney disease: Secondary | ICD-10-CM | POA: Diagnosis not present

## 2019-03-31 DIAGNOSIS — N186 End stage renal disease: Secondary | ICD-10-CM | POA: Diagnosis not present

## 2019-04-02 DIAGNOSIS — N186 End stage renal disease: Secondary | ICD-10-CM | POA: Diagnosis not present

## 2019-04-02 DIAGNOSIS — D631 Anemia in chronic kidney disease: Secondary | ICD-10-CM | POA: Diagnosis not present

## 2019-04-02 DIAGNOSIS — N2581 Secondary hyperparathyroidism of renal origin: Secondary | ICD-10-CM | POA: Diagnosis not present

## 2019-04-02 DIAGNOSIS — E875 Hyperkalemia: Secondary | ICD-10-CM | POA: Diagnosis not present

## 2019-04-05 DIAGNOSIS — E875 Hyperkalemia: Secondary | ICD-10-CM | POA: Diagnosis not present

## 2019-04-05 DIAGNOSIS — D631 Anemia in chronic kidney disease: Secondary | ICD-10-CM | POA: Diagnosis not present

## 2019-04-05 DIAGNOSIS — N2581 Secondary hyperparathyroidism of renal origin: Secondary | ICD-10-CM | POA: Diagnosis not present

## 2019-04-05 DIAGNOSIS — N186 End stage renal disease: Secondary | ICD-10-CM | POA: Diagnosis not present

## 2019-04-07 DIAGNOSIS — D631 Anemia in chronic kidney disease: Secondary | ICD-10-CM | POA: Diagnosis not present

## 2019-04-07 DIAGNOSIS — N186 End stage renal disease: Secondary | ICD-10-CM | POA: Diagnosis not present

## 2019-04-07 DIAGNOSIS — N2581 Secondary hyperparathyroidism of renal origin: Secondary | ICD-10-CM | POA: Diagnosis not present

## 2019-04-07 DIAGNOSIS — E875 Hyperkalemia: Secondary | ICD-10-CM | POA: Diagnosis not present

## 2019-04-09 DIAGNOSIS — D631 Anemia in chronic kidney disease: Secondary | ICD-10-CM | POA: Diagnosis not present

## 2019-04-09 DIAGNOSIS — E875 Hyperkalemia: Secondary | ICD-10-CM | POA: Diagnosis not present

## 2019-04-09 DIAGNOSIS — N186 End stage renal disease: Secondary | ICD-10-CM | POA: Diagnosis not present

## 2019-04-09 DIAGNOSIS — N2581 Secondary hyperparathyroidism of renal origin: Secondary | ICD-10-CM | POA: Diagnosis not present

## 2019-04-12 DIAGNOSIS — N186 End stage renal disease: Secondary | ICD-10-CM | POA: Diagnosis not present

## 2019-04-12 DIAGNOSIS — E875 Hyperkalemia: Secondary | ICD-10-CM | POA: Diagnosis not present

## 2019-04-12 DIAGNOSIS — N2581 Secondary hyperparathyroidism of renal origin: Secondary | ICD-10-CM | POA: Diagnosis not present

## 2019-04-12 DIAGNOSIS — D631 Anemia in chronic kidney disease: Secondary | ICD-10-CM | POA: Diagnosis not present

## 2019-04-14 DIAGNOSIS — N2581 Secondary hyperparathyroidism of renal origin: Secondary | ICD-10-CM | POA: Diagnosis not present

## 2019-04-14 DIAGNOSIS — E875 Hyperkalemia: Secondary | ICD-10-CM | POA: Diagnosis not present

## 2019-04-14 DIAGNOSIS — D631 Anemia in chronic kidney disease: Secondary | ICD-10-CM | POA: Diagnosis not present

## 2019-04-14 DIAGNOSIS — N186 End stage renal disease: Secondary | ICD-10-CM | POA: Diagnosis not present

## 2019-04-15 ENCOUNTER — Encounter: Payer: Self-pay | Admitting: Nurse Practitioner

## 2019-04-15 ENCOUNTER — Other Ambulatory Visit: Payer: Self-pay

## 2019-04-15 ENCOUNTER — Ambulatory Visit (INDEPENDENT_AMBULATORY_CARE_PROVIDER_SITE_OTHER): Payer: Medicare Other | Admitting: Nurse Practitioner

## 2019-04-15 VITALS — BP 130/68 | HR 70 | Temp 98.4°F | Ht 62.2 in | Wt 192.2 lb

## 2019-04-15 DIAGNOSIS — R7303 Prediabetes: Secondary | ICD-10-CM

## 2019-04-15 DIAGNOSIS — Z992 Dependence on renal dialysis: Secondary | ICD-10-CM | POA: Diagnosis not present

## 2019-04-15 DIAGNOSIS — I5032 Chronic diastolic (congestive) heart failure: Secondary | ICD-10-CM | POA: Diagnosis not present

## 2019-04-15 DIAGNOSIS — I10 Essential (primary) hypertension: Secondary | ICD-10-CM

## 2019-04-15 DIAGNOSIS — I132 Hypertensive heart and chronic kidney disease with heart failure and with stage 5 chronic kidney disease, or end stage renal disease: Secondary | ICD-10-CM | POA: Diagnosis not present

## 2019-04-15 DIAGNOSIS — N186 End stage renal disease: Secondary | ICD-10-CM

## 2019-04-15 MED ORDER — MUPIROCIN 2 % EX OINT: 1.0000 "application " | TOPICAL_OINTMENT | Freq: Every day | CUTANEOUS | 2 refills | Status: DC | PRN

## 2019-04-15 NOTE — Progress Notes (Signed)
Subjective:     Patient ID: Whitney Walker , female    DOB: 08-20-1956 , 63 y.o.   MRN: [id number]   Chief Complaint  Patient presents with  . Hypertension  . Referral    HPI  She was put back on Bystolic - Dr. Einar Gip office and started having headaches.  She is now on Carvedilol due to the headaches. She is doing better since that time.  When she goes to dialysis her blood pressure is elevated to 476L systolic. She now takes the carvedilol after.  After dialysis will drop really low.    She was at ER on 3/6 for anxiety attack.  No cardiac cause. She had dialysis while there (reports dry 85 kg).    She reports having a 2nd fistula to her left arm and unable to use due to blood clot.  Seen by Nephrologist Jannifer Hick MD) with [facility] yesterday advised to contact provider about her left fistula).  Dialysis center on [address].  (just had labs done).   Hypertension  This is a chronic problem. The current episode started more than 1 year ago. The problem is controlled. Pertinent negatives include no anxiety, blurred vision, chest pain, headaches, palpitations, peripheral edema or shortness of breath. Risk factors for coronary artery disease include sedentary lifestyle. Past treatments include beta blockers. The current treatment provides moderate improvement. There are no compliance problems.  Hypertensive end-organ damage includes kidney disease and heart failure. There is no history of angina or CAD/MI. ESRD - Dialysis MWF - .      Past Medical History:  Diagnosis Date  . Anemia   . Anxiety   . CHF (congestive heart failure) ([facility])   . Colon cancer [facility])    treatment surgery  . Complication of anesthesia    after first C- [place] "couldnt walk after", patient denies having a spinal  . COPD (chronic obstructive pulmonary disease) ([facility])   . Coronary artery disease   . Depression   . DVT (deep venous thrombosis) ([facility])   . ESRD (end stage renal disease) ([facility])    Hemo: MWF   . History of blood transfusion 04/2018  . Hypertension    07/07/18- no longer takes BP medications  . Meningitis   . PE (pulmonary embolism)   . Peripheral vascular disease ([facility])   . Restless legs   . Shortness of breath    with exertion  . Sleep apnea   . Vertigo      Family History  Problem Relation Age of Onset  . Cancer Mother 25       breast and bone  . Cancer Father 13       prostate  . Hypertension Sister   . Bleeding Disorder Sister   . Cancer Cousin 20       breast cancer   . Hypertension Daughter   . Breast cancer Neg Hx      Current Outpatient Medications:  .  acetaminophen (TYLENOL) 500 MG tablet, Take 1,000 mg by mouth 2 (two) times daily as needed for moderate pain or headache., Disp: , Rfl:  .  albuterol (PROVENTIL HFA;VENTOLIN HFA) 108 (90 Base) MCG/ACT inhaler, Inhale 2 puffs into the lungs every 6 (six) hours as needed for wheezing or shortness of breath. , Disp: , Rfl:  .  b complex-vitamin c-folic acid (NEPHRO-VITE) 0.8 MG TABS tablet, Take 1 tablet by mouth at bedtime., Disp: , Rfl:  .  carvedilol (COREG) 6.25 MG tablet, Take 6.25 mg by mouth  2 (two) times daily with a meal. Only takes once a day on dialysis days (holds in morning on Monday Wednesday and Friday), Disp: , Rfl:  .  ELIQUIS 2.5 MG TABS tablet, TAKE 1 TABLET(2.5 MG) BY MOUTH TWICE DAILY (Patient taking differently: Take 2.5 mg by mouth 2 (two) times daily. ), Disp: 60 tablet, Rfl: 1 .  ferric citrate (AURYXIA) 1 GM 210 MG(Fe) tablet, Take 210-420 mg by mouth See admin instructions. Take 210-420 mg with each meal (based on size of meal) and 210 mg with each snack , Disp: , Rfl:  .  meclizine (ANTIVERT) 12.5 MG tablet, Take 12.5 mg by mouth 3 (three) times daily as needed for dizziness., Disp: , Rfl:  .  mupirocin ointment (BACTROBAN) 2 %, Apply 1 application topically daily as needed (skin bumps). , Disp: , Rfl:  .  NYSTATIN powder, Apply 1 Bottle topically 2 (two) times daily as needed (dryness).  , Disp: , Rfl: 2 .  nebivolol (BYSTOLIC) 10 MG tablet, Take 10 mg by mouth daily., Disp: , Rfl:    Allergies  Allergen Reactions  . Gadolinium Derivatives Hives and Other (See Comments)    HIVES, Desc: HIVES W/ "DYE" USED FOR 1ST CT SCAN BUT NOT 2ND, NO PREMEDS USED, PT UNCERTAIN OF CIRCUMSTANCES,,?POSSIBLE MRI CONTRAST ALLERGY, ALL STUDIES DONE "SOMEWHERE" IN [place]//A.C., Onset Date: [id number]  . Iohexol Other (See Comments)     Code: HIVES, Desc: HIVES W/ "DYE" USED FOR 1ST CT SCAN BUT NOT 2ND, NO PREMEDS USED, PT UNCERTAIN OF CIRCUMSTANCES,,?POSSIBLE MRI CONTRAST ALLERGY, ALL STUDIES DONE "SOMEWHERE" IN [place]//A.C., Onset Date: [id number]   . Iodine Hives     Review of Systems  Constitutional: Negative.  Negative for fatigue.  Eyes: Negative for blurred vision and visual disturbance.  Respiratory: Negative.  Negative for cough and shortness of breath.   Cardiovascular: Negative.  Negative for chest pain, palpitations and leg swelling.  Gastrointestinal: Negative.   Endocrine: Negative.   Musculoskeletal: Negative.   Skin: Negative.   Neurological: Negative for dizziness, weakness and headaches.  Psychiatric/Behavioral: Negative for confusion. The patient is not nervous/anxious.      Today's Vitals   04/15/19 1001  BP: 130/68  Pulse: 70  Temp: 98.4 F (36.9 C)  TempSrc: Oral  SpO2: 97%  Weight: 192 lb 3.2 oz (87.2 kg)  Height: 5' 2.2" (1.58 m)   Body mass index is 34.93 kg/m.   Objective:  Physical Exam Vitals signs reviewed.  Constitutional:      Appearance: Normal appearance. She is well-developed.  HENT:     Head: Normocephalic and atraumatic.  Eyes:     Pupils: Pupils are equal, round, and reactive to light.  Cardiovascular:     Rate and Rhythm: Normal rate and regular rhythm.     Pulses: Normal pulses.     Heart sounds: Normal heart sounds. No murmur.  Pulmonary:     Effort: Pulmonary effort is normal.     Breath sounds: Normal breath sounds.   Musculoskeletal: Normal range of motion.  Skin:    General: Skin is warm and dry.     Capillary Refill: Capillary refill takes less than 2 seconds.  Neurological:     General: No focal deficit present.     Mental Status: She is alert and oriented to person, place, and time.     Cranial Nerves: No cranial nerve deficit.  Psychiatric:        Mood and Affect: Mood normal.  Assessment And Plan:     1. Essential hypertension . B/P is controlled.  . Being managed by Dr. Einar Gip . She has had labs done at the [facility] yesterday . The importance of regular exercise and dietary modification was stressed to the patient.  . Stressed importance of losing ten percent of her body weight to help with B/P control.  . The weight loss would help with decreasing cardiac and cancer risk as well.   2. ESRD (end stage renal disease) ([facility])  She is going to [facility] on [address]  Her current fistula to her left arm is not working she reports having a clot to that arm  3. Chronic diastolic heart failure (HCC)  Chronic, controlled  Being managed by Dr. Einar Gip -   Continue with current medications  4. Prediabetes  Chronic, stable   No current medications  Encouraged to limit intake of sugary foods and drinks  Encouraged to increase physical activity to 150 minutes per week - Hemoglobin A1c - Lipid Profile       [name], FNP    THE PATIENT IS ENCOURAGED TO PRACTICE SOCIAL DISTANCING DUE TO THE COVID-19 PANDEMIC.

## 2019-04-16 DIAGNOSIS — N2581 Secondary hyperparathyroidism of renal origin: Secondary | ICD-10-CM | POA: Diagnosis not present

## 2019-04-16 DIAGNOSIS — E875 Hyperkalemia: Secondary | ICD-10-CM | POA: Diagnosis not present

## 2019-04-16 DIAGNOSIS — N186 End stage renal disease: Secondary | ICD-10-CM | POA: Diagnosis not present

## 2019-04-16 DIAGNOSIS — D631 Anemia in chronic kidney disease: Secondary | ICD-10-CM | POA: Diagnosis not present

## 2019-04-17 ENCOUNTER — Other Ambulatory Visit: Payer: Self-pay | Admitting: Hematology

## 2019-04-17 DIAGNOSIS — I82412 Acute embolism and thrombosis of left femoral vein: Secondary | ICD-10-CM

## 2019-04-19 DIAGNOSIS — D631 Anemia in chronic kidney disease: Secondary | ICD-10-CM | POA: Diagnosis not present

## 2019-04-19 DIAGNOSIS — N186 End stage renal disease: Secondary | ICD-10-CM | POA: Diagnosis not present

## 2019-04-19 DIAGNOSIS — E875 Hyperkalemia: Secondary | ICD-10-CM | POA: Diagnosis not present

## 2019-04-19 DIAGNOSIS — N2581 Secondary hyperparathyroidism of renal origin: Secondary | ICD-10-CM | POA: Diagnosis not present

## 2019-04-21 ENCOUNTER — Telehealth: Payer: Self-pay

## 2019-04-21 DIAGNOSIS — N2581 Secondary hyperparathyroidism of renal origin: Secondary | ICD-10-CM | POA: Diagnosis not present

## 2019-04-21 DIAGNOSIS — N186 End stage renal disease: Secondary | ICD-10-CM | POA: Diagnosis not present

## 2019-04-21 DIAGNOSIS — E875 Hyperkalemia: Secondary | ICD-10-CM | POA: Diagnosis not present

## 2019-04-21 DIAGNOSIS — D631 Anemia in chronic kidney disease: Secondary | ICD-10-CM | POA: Diagnosis not present

## 2019-04-21 NOTE — Telephone Encounter (Signed)
Pt called and said that she is having worsening pain from her groin area into the ankle. Said that the pain is worse when lifting leg or taking a step up. It has been ongoing intermittently for the past 3-4 months but has become more constant. She is taking Eloquis regularly which she states she has not missed any doses of. She denies any redness, fevers or SOB. Said that the area around her calf is sore to touch.   Spoke with Zigmund Daniel and given patients medical history and the concerns that she is having, she has been advised to call her PCP to discuss if this may be an issue that is musculoskeletal rather than vascular.   She is at dialysis now and said that they are wrapping her leg. She was told that if the physician sees something abnormal that he is more concerned about they can reach out to [place] here.   York Cerise, CMA

## 2019-04-22 LAB — BASIC METABOLIC PANEL
BUN: 62 — AB (ref 4–21)
Creatinine: 10.7 — AB (ref 0.5–1.1)
Potassium: 6 — AB (ref 3.4–5.3)

## 2019-04-22 LAB — CBC AND DIFFERENTIAL
HCT: 37 (ref 36–46)
Hemoglobin: 33.3 — AB (ref 12.0–16.0)
WBC: 10.8

## 2019-04-22 LAB — IRON,TIBC AND FERRITIN PANEL
Ferritin: 1660
Iron: 170
UIBC: 63

## 2019-04-23 DIAGNOSIS — N186 End stage renal disease: Secondary | ICD-10-CM | POA: Diagnosis not present

## 2019-04-23 DIAGNOSIS — D631 Anemia in chronic kidney disease: Secondary | ICD-10-CM | POA: Diagnosis not present

## 2019-04-23 DIAGNOSIS — E875 Hyperkalemia: Secondary | ICD-10-CM | POA: Diagnosis not present

## 2019-04-23 DIAGNOSIS — N2581 Secondary hyperparathyroidism of renal origin: Secondary | ICD-10-CM | POA: Diagnosis not present

## 2019-04-26 DIAGNOSIS — N186 End stage renal disease: Secondary | ICD-10-CM | POA: Diagnosis not present

## 2019-04-26 DIAGNOSIS — N2581 Secondary hyperparathyroidism of renal origin: Secondary | ICD-10-CM | POA: Diagnosis not present

## 2019-04-26 DIAGNOSIS — E875 Hyperkalemia: Secondary | ICD-10-CM | POA: Diagnosis not present

## 2019-04-26 DIAGNOSIS — D631 Anemia in chronic kidney disease: Secondary | ICD-10-CM | POA: Diagnosis not present

## 2019-04-27 DIAGNOSIS — T8249XA Other complication of vascular dialysis catheter, initial encounter: Secondary | ICD-10-CM | POA: Diagnosis not present

## 2019-04-27 DIAGNOSIS — Z992 Dependence on renal dialysis: Secondary | ICD-10-CM | POA: Diagnosis not present

## 2019-04-27 DIAGNOSIS — N186 End stage renal disease: Secondary | ICD-10-CM | POA: Diagnosis not present

## 2019-04-28 ENCOUNTER — Encounter: Payer: Self-pay | Admitting: Nurse Practitioner

## 2019-04-28 DIAGNOSIS — D631 Anemia in chronic kidney disease: Secondary | ICD-10-CM | POA: Diagnosis not present

## 2019-04-28 DIAGNOSIS — N186 End stage renal disease: Secondary | ICD-10-CM | POA: Diagnosis not present

## 2019-04-28 DIAGNOSIS — N2581 Secondary hyperparathyroidism of renal origin: Secondary | ICD-10-CM | POA: Diagnosis not present

## 2019-04-28 DIAGNOSIS — E875 Hyperkalemia: Secondary | ICD-10-CM | POA: Diagnosis not present

## 2019-04-30 ENCOUNTER — Ambulatory Visit: Payer: Self-pay | Admitting: Vascular Surgery

## 2019-04-30 DIAGNOSIS — N186 End stage renal disease: Secondary | ICD-10-CM | POA: Diagnosis not present

## 2019-04-30 DIAGNOSIS — Z992 Dependence on renal dialysis: Secondary | ICD-10-CM | POA: Diagnosis not present

## 2019-04-30 DIAGNOSIS — I129 Hypertensive chronic kidney disease with stage 1 through stage 4 chronic kidney disease, or unspecified chronic kidney disease: Secondary | ICD-10-CM | POA: Diagnosis not present

## 2019-04-30 DIAGNOSIS — N2581 Secondary hyperparathyroidism of renal origin: Secondary | ICD-10-CM | POA: Diagnosis not present

## 2019-04-30 DIAGNOSIS — D631 Anemia in chronic kidney disease: Secondary | ICD-10-CM | POA: Diagnosis not present

## 2019-05-03 DIAGNOSIS — N186 End stage renal disease: Secondary | ICD-10-CM | POA: Diagnosis not present

## 2019-05-03 DIAGNOSIS — N2581 Secondary hyperparathyroidism of renal origin: Secondary | ICD-10-CM | POA: Diagnosis not present

## 2019-05-03 DIAGNOSIS — D631 Anemia in chronic kidney disease: Secondary | ICD-10-CM | POA: Diagnosis not present

## 2019-05-05 DIAGNOSIS — N186 End stage renal disease: Secondary | ICD-10-CM | POA: Diagnosis not present

## 2019-05-05 DIAGNOSIS — D631 Anemia in chronic kidney disease: Secondary | ICD-10-CM | POA: Diagnosis not present

## 2019-05-05 DIAGNOSIS — N2581 Secondary hyperparathyroidism of renal origin: Secondary | ICD-10-CM | POA: Diagnosis not present

## 2019-05-07 DIAGNOSIS — D631 Anemia in chronic kidney disease: Secondary | ICD-10-CM | POA: Diagnosis not present

## 2019-05-07 DIAGNOSIS — N186 End stage renal disease: Secondary | ICD-10-CM | POA: Diagnosis not present

## 2019-05-07 DIAGNOSIS — N2581 Secondary hyperparathyroidism of renal origin: Secondary | ICD-10-CM | POA: Diagnosis not present

## 2019-05-10 DIAGNOSIS — N186 End stage renal disease: Secondary | ICD-10-CM | POA: Diagnosis not present

## 2019-05-10 DIAGNOSIS — N2581 Secondary hyperparathyroidism of renal origin: Secondary | ICD-10-CM | POA: Diagnosis not present

## 2019-05-10 DIAGNOSIS — D631 Anemia in chronic kidney disease: Secondary | ICD-10-CM | POA: Diagnosis not present

## 2019-05-12 DIAGNOSIS — N2581 Secondary hyperparathyroidism of renal origin: Secondary | ICD-10-CM | POA: Diagnosis not present

## 2019-05-12 DIAGNOSIS — N186 End stage renal disease: Secondary | ICD-10-CM | POA: Diagnosis not present

## 2019-05-12 DIAGNOSIS — D631 Anemia in chronic kidney disease: Secondary | ICD-10-CM | POA: Diagnosis not present

## 2019-05-14 DIAGNOSIS — N2581 Secondary hyperparathyroidism of renal origin: Secondary | ICD-10-CM | POA: Diagnosis not present

## 2019-05-14 DIAGNOSIS — N186 End stage renal disease: Secondary | ICD-10-CM | POA: Diagnosis not present

## 2019-05-14 DIAGNOSIS — D631 Anemia in chronic kidney disease: Secondary | ICD-10-CM | POA: Diagnosis not present

## 2019-05-17 DIAGNOSIS — D631 Anemia in chronic kidney disease: Secondary | ICD-10-CM | POA: Diagnosis not present

## 2019-05-17 DIAGNOSIS — N186 End stage renal disease: Secondary | ICD-10-CM | POA: Diagnosis not present

## 2019-05-17 DIAGNOSIS — N2581 Secondary hyperparathyroidism of renal origin: Secondary | ICD-10-CM | POA: Diagnosis not present

## 2019-05-19 ENCOUNTER — Encounter: Payer: Self-pay | Admitting: Nurse Practitioner

## 2019-05-19 DIAGNOSIS — N186 End stage renal disease: Secondary | ICD-10-CM | POA: Diagnosis not present

## 2019-05-19 DIAGNOSIS — N2581 Secondary hyperparathyroidism of renal origin: Secondary | ICD-10-CM | POA: Diagnosis not present

## 2019-05-19 DIAGNOSIS — D631 Anemia in chronic kidney disease: Secondary | ICD-10-CM | POA: Diagnosis not present

## 2019-05-21 DIAGNOSIS — N2581 Secondary hyperparathyroidism of renal origin: Secondary | ICD-10-CM | POA: Diagnosis not present

## 2019-05-21 DIAGNOSIS — D631 Anemia in chronic kidney disease: Secondary | ICD-10-CM | POA: Diagnosis not present

## 2019-05-21 DIAGNOSIS — N186 End stage renal disease: Secondary | ICD-10-CM | POA: Diagnosis not present

## 2019-05-24 DIAGNOSIS — D631 Anemia in chronic kidney disease: Secondary | ICD-10-CM | POA: Diagnosis not present

## 2019-05-24 DIAGNOSIS — N2581 Secondary hyperparathyroidism of renal origin: Secondary | ICD-10-CM | POA: Diagnosis not present

## 2019-05-24 DIAGNOSIS — N186 End stage renal disease: Secondary | ICD-10-CM | POA: Diagnosis not present

## 2019-05-26 DIAGNOSIS — D631 Anemia in chronic kidney disease: Secondary | ICD-10-CM | POA: Diagnosis not present

## 2019-05-26 DIAGNOSIS — N2581 Secondary hyperparathyroidism of renal origin: Secondary | ICD-10-CM | POA: Diagnosis not present

## 2019-05-26 DIAGNOSIS — N186 End stage renal disease: Secondary | ICD-10-CM | POA: Diagnosis not present

## 2019-05-28 DIAGNOSIS — D631 Anemia in chronic kidney disease: Secondary | ICD-10-CM | POA: Diagnosis not present

## 2019-05-28 DIAGNOSIS — N186 End stage renal disease: Secondary | ICD-10-CM | POA: Diagnosis not present

## 2019-05-28 DIAGNOSIS — N2581 Secondary hyperparathyroidism of renal origin: Secondary | ICD-10-CM | POA: Diagnosis not present

## 2019-05-31 DIAGNOSIS — I129 Hypertensive chronic kidney disease with stage 1 through stage 4 chronic kidney disease, or unspecified chronic kidney disease: Secondary | ICD-10-CM | POA: Diagnosis not present

## 2019-05-31 DIAGNOSIS — N2581 Secondary hyperparathyroidism of renal origin: Secondary | ICD-10-CM | POA: Diagnosis not present

## 2019-05-31 DIAGNOSIS — N186 End stage renal disease: Secondary | ICD-10-CM | POA: Diagnosis not present

## 2019-05-31 DIAGNOSIS — Z992 Dependence on renal dialysis: Secondary | ICD-10-CM | POA: Diagnosis not present

## 2019-06-02 DIAGNOSIS — N2581 Secondary hyperparathyroidism of renal origin: Secondary | ICD-10-CM | POA: Diagnosis not present

## 2019-06-02 DIAGNOSIS — N186 End stage renal disease: Secondary | ICD-10-CM | POA: Diagnosis not present

## 2019-06-04 DIAGNOSIS — N186 End stage renal disease: Secondary | ICD-10-CM | POA: Diagnosis not present

## 2019-06-04 DIAGNOSIS — N2581 Secondary hyperparathyroidism of renal origin: Secondary | ICD-10-CM | POA: Diagnosis not present

## 2019-06-07 DIAGNOSIS — N186 End stage renal disease: Secondary | ICD-10-CM | POA: Diagnosis not present

## 2019-06-07 DIAGNOSIS — N2581 Secondary hyperparathyroidism of renal origin: Secondary | ICD-10-CM | POA: Diagnosis not present

## 2019-06-09 DIAGNOSIS — N186 End stage renal disease: Secondary | ICD-10-CM | POA: Diagnosis not present

## 2019-06-09 DIAGNOSIS — N2581 Secondary hyperparathyroidism of renal origin: Secondary | ICD-10-CM | POA: Diagnosis not present

## 2019-06-10 ENCOUNTER — Other Ambulatory Visit: Payer: Self-pay

## 2019-06-10 ENCOUNTER — Ambulatory Visit (INDEPENDENT_AMBULATORY_CARE_PROVIDER_SITE_OTHER): Payer: Medicare Other

## 2019-06-10 ENCOUNTER — Ambulatory Visit: Payer: Medicare Other | Admitting: Nurse Practitioner

## 2019-06-10 ENCOUNTER — Encounter: Payer: Medicare Other | Admitting: Nurse Practitioner

## 2019-06-10 VITALS — BP 130/78 | HR 59 | Temp 98.4°F | Ht 62.0 in | Wt 190.8 lb

## 2019-06-10 DIAGNOSIS — Z0000 Encounter for general adult medical examination without abnormal findings: Secondary | ICD-10-CM

## 2019-06-10 NOTE — Progress Notes (Signed)
Subjective:   Whitney Walker is a 63 y.o. female who presents for Medicare Annual (Subsequent) preventive examination.  Review of Systems:  n/a Cardiac Risk Factors include: hypertension;smoking/ tobacco exposure;sedentary lifestyle;obesity (BMI >30kg/m2)     Objective:     Vitals: BP 130/78 (BP Location: Left Arm, Patient Position: Sitting, Cuff Size: Large)   Pulse (!) 59   Temp 98.4 F (36.9 C) (Oral)   Ht 5\' 2"  (1.575 m)   Wt 190 lb 12.8 oz (86.5 kg)   SpO2 98%   BMI 34.90 kg/m   Body mass index is 34.9 kg/m.  Advanced Directives 06/10/2019 03/02/2019 12/15/2018 08/07/2018 07/16/2018 04/30/2018 04/10/2018  Does Patient Have a Medical Advance Directive? No No Yes No No No No  Type of Advance Directive - - Living will - - - -  Would patient like information on creating a medical advance directive? No - Patient declined No - Patient declined - - No - Patient declined No - Patient declined No - Patient declined  Pre-existing out of facility DNR order (yellow form or pink MOST form) - - - - - - -    Tobacco Social History   Tobacco Use  Smoking Status Current Some Day Smoker  . Packs/day: 0.00  . Years: 40.00  . Pack years: 0.00  . Types: Cigarettes  Smokeless Tobacco Former [place]   has an occasional cigarette     Ready to quit: Not Answered Counseling given: Not Answered Comment: has an occasional cigarette   Clinical Intake:  Pre-visit preparation completed: Yes  Pain : No/denies pain Pain Score: 0-No pain     Nutritional Status: BMI > 30  Obese Nutritional Risks: None Diabetes: No  How often do you need to have someone help you when you read instructions, pamphlets, or other written materials from your doctor or pharmacy?: 1 - Never What is the last grade level you completed in school?: 12th grade  Interpreter Needed?: No  Information entered by :: NAllen LPN  Past Medical History:  Diagnosis Date  . Anemia   . Anxiety   . CHF  (congestive heart failure) ([place])   . Colon cancer [facility])    treatment surgery  . Complication of anesthesia    after first C- [place] "couldnt walk after", patient denies having a spinal  . COPD (chronic obstructive pulmonary disease) ([place])   . Coronary artery disease   . Depression   . DVT (deep venous thrombosis) ([place])   . ESRD (end stage renal disease) ([place])    Hemo: MWF  . History of blood transfusion 04/2018  . Hypertension    07/07/18- no longer takes BP medications  . Meningitis   . PE (pulmonary embolism)   . Peripheral vascular disease ([place])   . Restless legs   . Shortness of breath    with exertion  . Sleep apnea   . Vertigo    Past Surgical History:  Procedure Laterality Date  . ABDOMINAL AORTAGRAM N/A 07/26/2013   Procedure: ABDOMINAL Maxcine Ham;  Surgeon: [name] Socorro, MD;  Location: [facility] CATH LAB;  Service: Cardiovascular;  Laterality: N/A;  . ABDOMINAL HYSTERECTOMY    . AV FISTULA PLACEMENT Left 05/05/2018   Procedure: ARTERIOVENOUS (AV) FISTULA CREATION BRACHIOCEPHALIC;  Surgeon: Waynetta Sandy, MD;  Location: [place];  Service: Vascular;  Laterality: Left;  . AV FISTULA PLACEMENT Left 07/16/2018   Procedure: ARTERIOVENOUS FISTULA CREATION;  Surgeon: Waynetta Sandy, MD;  Location: [place];  Service: Vascular;  Laterality: Left;  . BASCILIC VEIN TRANSPOSITION Left 09/03/2018   Procedure: BASILIC VEIN TRANSPOSITION SECOND STAGE;  Surgeon: Waynetta Sandy, MD;  Location: [place];  Service: Vascular;  Laterality: Left;  . BREAST BIOPSY Left   . CESAREAN SECTION     X 3 [id number]  . CHOLECYSTECTOMY    . CHOLECYSTECTOMY  1980/s  . COLECTOMY  2010  . DIVERTICULOSIS SURGERY-2002  2012  . FEMORAL-POPLITEAL BYPASS GRAFT Left 08/13/2013   Procedure: BYPASS GRAFT FEMORAL-POPLITEAL ARTERY WITH NON-REVERSED SAPHANEOUS VEIN; ULTRASOUND GUIDED;  Surgeon: Mal [name], MD;  Location: [place];  Service: Vascular;  Laterality: Left;  . INSERTION OF DIALYSIS  CATHETER Right 04/30/2018   Procedure: INSERTION OF Right Internal Jugular DIALYSIS CATHETER;  Surgeon: [name], MD;  Location: [place];  Service: Vascular;  Laterality: Right;  . LOWER EXTREMITY ANGIOGRAM Left 07/26/2013   Procedure: LOWER EXTREMITY ANGIOGRAM;  Surgeon: [name] Firth, MD;  Location: [facility] CATH LAB;  Service: Cardiovascular;  Laterality: Left;   Family History  Problem Relation Age of Onset  . Cancer Mother 25       breast and bone  . Cancer Father 53       prostate  . Hypertension Sister   . Bleeding Disorder Sister   . Cancer Cousin 20       breast cancer   . Hypertension Daughter   . Breast cancer Neg Hx    Social History   Socioeconomic History  . Marital status: Single    Spouse name: Not on file  . Number of children: 4  . Years of education: 12th gd  . Highest education level: Not on file  Occupational History  . Occupation: N/A  Social Needs  . Financial resource strain: Not hard at all  . Food insecurity    Worry: Never true    Inability: Never true  . Transportation needs    Medical: No    Non-medical: No  Tobacco Use  . Smoking status: Current Some Day Smoker    Packs/day: 0.00    Years: 40.00    Pack years: 0.00    Types: Cigarettes  . Smokeless tobacco: Former Systems developer  . Tobacco comment: has an occasional cigarette  Substance and Sexual Activity  . Alcohol use: No    Alcohol/week: 0.0 standard drinks    Comment: she used to drink alcohol, quit in 2010   . Drug use: No  . Sexual activity: Not Currently  Lifestyle  . Physical activity    Days per week: 0 days    Minutes per session: 0 min  . Stress: Not on file  Relationships  . Social Herbalist on phone: Not on file    Gets together: Not on file    Attends religious service: Not on file    Active member of club or organization: Not on file    Attends meetings of clubs or organizations: Not on file    Relationship status: Not on file  Other Topics Concern  . Not on  file  Social History Narrative   2 cups of coffee a day     Outpatient Encounter Medications as of 06/10/2019  Medication Sig  . acetaminophen (TYLENOL) 500 MG tablet Take 1,000 mg by mouth 2 (two) times daily as needed for moderate pain or headache.  . albuterol (PROVENTIL HFA;VENTOLIN HFA) 108 (90 Base) MCG/ACT inhaler Inhale 2 puffs into the lungs every 6 (six) hours as needed for wheezing or shortness  of breath.   . Biotin 10000 MCG TABS Take by mouth.  . carvedilol (COREG) 6.25 MG tablet Take 6.25 mg by mouth 2 (two) times daily with a meal. Only takes once a day on dialysis days (holds in morning on Monday Wednesday and Friday)  . ELIQUIS 2.5 MG TABS tablet TAKE 1 TABLET(2.5 MG) BY MOUTH TWICE DAILY  . ferric citrate (AURYXIA) 1 GM 210 MG(Fe) tablet Take 210-420 mg by mouth See admin instructions. Take 210-420 mg with each meal (based on size of meal) and 210 mg with each snack   . meclizine (ANTIVERT) 12.5 MG tablet Take 12.5 mg by mouth 3 (three) times daily as needed for dizziness.  . mupirocin ointment (BACTROBAN) 2 % Apply 1 application topically daily as needed (skin bumps).  . NYSTATIN powder Apply 1 Bottle topically 2 (two) times daily as needed (dryness).   [name] b complex-vitamin c-folic acid (NEPHRO-VITE) 0.8 MG TABS tablet Take 1 tablet by mouth at bedtime.   No facility-administered encounter medications on file as of 06/10/2019.     Activities of Daily Living In your present state of health, do you have any difficulty performing the following activities: 06/10/2019 03/02/2019  Hearing? N N  Vision? Y N  Comment some blurriness -  Difficulty concentrating or making decisions? Y N  Comment saw a neurologist -  Walking or climbing stairs? Y Y  Comment SOB -  Dressing or bathing? N N  Doing errands, shopping? N -  Preparing Food and eating ? N -  Using the Toilet? N -  In the past six months, have you accidently leaked urine? N -  Do you have problems with loss of bowel  control? N -  Managing your Medications? N -  Managing your Finances? N -  Housekeeping or managing your Housekeeping? N -  Some recent data might be hidden    Patient Care Team: Glendale Chard, MD as PCP - General (Internal Medicine) Waynetta Sandy, MD as Consulting Physician (Vascular Surgery) [facility], [facility] as Referring Physician (Nephrology) Edrick Oh, MD as Consulting Physician (Nephrology) Truitt Merle, MD as Consulting Physician (Hematology)    Assessment:   This is a routine wellness examination for Snead.  Exercise Activities and Dietary recommendations Current Exercise Habits: The patient does not participate in regular exercise at present  Goals    . Patient Stated     No goals       Fall Risk Fall Risk  06/10/2019 04/15/2019 10/29/2018  Falls in the past year? 0 0 Yes  Number falls in past yr: - - 2 or more  Injury with Fall? - - Yes  Risk for fall due to : Medication side effect - -  Follow up Falls prevention discussed;Education provided - -   Is the patient's home free of loose throw rugs in walkways, pet beds, electrical cords, etc?   yes      Grab bars in the bathroom? yes      Handrails on the stairs?   yes      Adequate lighting?   yes  Timed Get Up and Go performed: n/a  Depression Screen PHQ 2/9 Scores 06/10/2019 04/15/2019 10/29/2018  PHQ - 2 Score 3 1 0  PHQ- 9 Score 10 - -     Cognitive Function MMSE - Mini Mental State Exam 03/19/2017 07/31/2016 02/05/2016 05/04/2015  Orientation to time 4 5 5 5   Orientation to Place 4 5 5 4   Registration 3 3  3 3  Attention/ Calculation 1 4 4 4   Recall 1 2 2 3   Language- name 2 objects 2 2 2 2   Language- repeat 1 1 1 1   Language- follow 3 step command 3 3 3 2   Language- read & follow direction 1 1 1 1   Write a sentence 1 1 1 1   Copy design 1 1 1 1   Total score 22 28 28 27      6CIT Screen 06/10/2019  What Year? 0 points  What month? 0 points  What time? 0 points   Count back from 20 0 points  Months in reverse 0 points  Repeat phrase 0 points  Total Score 0     There is no immunization history on file for this patient.  Qualifies for Shingles Vaccine? yes  Screening Tests Health Maintenance  Topic Date Due  . PAP SMEAR-Modifier  06/29/1976  . TETANUS/TDAP  06/09/2020 (Originally 06/29/1974)  . INFLUENZA VACCINE  07/31/2019  . MAMMOGRAM  08/11/2020  . COLONOSCOPY  06/05/2022  . Hepatitis C Screening  Completed  . HIV Screening  Completed    Cancer Screenings: Lung: Low Dose CT Chest recommended if Age 80-80 years, 30 pack-year currently smoking OR have quit w/in 15years. Patient does not qualify. Breast:  Up to date on Mammogram? Yes   Up to date of Bone Density/Dexa? Yes Colorectal: up to date  Additional Screenings: : Hepatitis C Screening: 05/28/2012     Plan:   No goals set. Declined TDAP.  I have personally reviewed and noted the following in the patient's chart:   . Medical and social history . Use of alcohol, tobacco or illicit drugs  . Current medications and supplements . Functional ability and status . Nutritional status . Physical activity . Advanced directives . List of other physicians . Hospitalizations, surgeries, and ER visits in previous 12 months . Vitals . Screenings to include cognitive, depression, and falls . Referrals and appointments  In addition, I have reviewed and discussed with patient certain preventive protocols, quality metrics, and best practice recommendations. A written personalized care plan for preventive services as well as general preventive health recommendations were provided to patient.     Kellie Simmering, LPN  1/85/[id number]

## 2019-06-10 NOTE — Patient Instructions (Signed)
Whitney Walker , Thank you for taking time to come for your Medicare Wellness Visit. I appreciate your ongoing commitment to your health goals. Please review the following plan we discussed and let me know if I can assist you in the future.   Screening recommendations/referrals: Colonoscopy: 06/2017 Mammogram: 07/2018 Bone Density: n/a Recommended yearly ophthalmology/optometry visit for glaucoma screening and checkup Recommended yearly dental visit for hygiene and checkup  Vaccinations: Influenza vaccine: 09/2018 Pneumococcal vaccine: n/a Tdap vaccine: decline Shingles vaccine: discussed    Advanced directives: Advance directive discussed with you today. Even though you declined this today please call our office should you change your mind and we can give you the proper paperwork for you to fill out.   Conditions/risks identified: obesity  Next appointment: 08/02/2019 at 10:45  Preventive Care 40-64 Years, Female Preventive care refers to lifestyle choices and visits with your health care provider that can promote health and wellness. What does preventive care include?  A yearly physical exam. This is also called an annual well check.  Dental exams once or twice a year.  Routine eye exams. Ask your health care provider how often you should have your eyes checked.  Personal lifestyle choices, including:  Daily care of your teeth and gums.  Regular physical activity.  Eating a healthy diet.  Avoiding tobacco and drug use.  Limiting alcohol use.  Practicing safe sex.  Taking low-dose aspirin daily starting at age 13.  Taking vitamin and mineral supplements as recommended by your health care provider. What happens during an annual well check? The services and screenings done by your health care provider during your annual well check will depend on your age, overall health, lifestyle risk factors, and family history of disease. Counseling  Your health care provider may ask  you questions about your:  Alcohol use.  Tobacco use.  Drug use.  Emotional well-being.  Home and relationship well-being.  Sexual activity.  Eating habits.  Work and work Statistician.  Method of birth control.  Menstrual cycle.  Pregnancy history. Screening  You may have the following tests or measurements:  Height, weight, and BMI.  Blood pressure.  Lipid and cholesterol levels. These may be checked every 5 years, or more frequently if you are over 23 years old.  Skin check.  Lung cancer screening. You may have this screening every year starting at age 64 if you have a 30-pack-year history of smoking and currently smoke or have quit within the past 15 years.  Fecal occult blood test (FOBT) of the stool. You may have this test every year starting at age 62.  Flexible sigmoidoscopy or colonoscopy. You may have a sigmoidoscopy every 5 years or a colonoscopy every 10 years starting at age 20.  Hepatitis C blood test.  Hepatitis B blood test.  Sexually transmitted disease (STD) testing.  Diabetes screening. This is done by checking your blood sugar (glucose) after you have not eaten for a while (fasting). You may have this done every 1-3 years.  Mammogram. This may be done every 1-2 years. Talk to your health care provider about when you should start having regular mammograms. This may depend on whether you have a family history of breast cancer.  BRCA-related cancer screening. This may be done if you have a family history of breast, ovarian, tubal, or peritoneal cancers.  Pelvic exam and Pap test. This may be done every 3 years starting at age 24. Starting at age 63, this may be done every 5 years  if you have a Pap test in combination with an HPV test.  Bone density scan. This is done to screen for osteoporosis. You may have this scan if you are at high risk for osteoporosis. Discuss your test results, treatment options, and if necessary, the need for more tests  with your health care provider. Vaccines  Your health care provider may recommend certain vaccines, such as:  Influenza vaccine. This is recommended every year.  Tetanus, diphtheria, and acellular pertussis (Tdap, Td) vaccine. You may need a Td booster every 10 years.  Zoster vaccine. You may need this after age 71.  Pneumococcal 13-valent conjugate (PCV13) vaccine. You may need this if you have certain conditions and were not previously vaccinated.  Pneumococcal polysaccharide (PPSV23) vaccine. You may need one or two doses if you smoke cigarettes or if you have certain conditions. Talk to your health care provider about which screenings and vaccines you need and how often you need them. This information is not intended to replace advice given to you by your health care provider. Make sure you discuss any questions you have with your health care provider. Document Released: 01/12/2016 Document Revised: 09/04/2016 Document Reviewed: 10/17/2015 Elsevier Interactive Patient Education  2017 [place] Prevention in the Home Falls can cause injuries. They can happen to people of all ages. There are many things you can do to make your home safe and to help prevent falls. What can I do on the outside of my home?  Regularly fix the edges of walkways and driveways and fix any cracks.  Remove anything that might make you trip as you walk through a door, such as a raised step or threshold.  Trim any bushes or trees on the path to your home.  Use bright outdoor lighting.  Clear any walking paths of anything that might make someone trip, such as rocks or tools.  Regularly check to see if handrails are loose or broken. Make sure that both sides of any steps have handrails.  Any raised decks and porches should have guardrails on the edges.  Have any leaves, snow, or ice cleared regularly.  Use sand or salt on walking paths during winter.  Clean up any spills in your garage  right away. This includes oil or grease spills. What can I do in the bathroom?  Use night lights.  Install grab bars by the toilet and in the tub and shower. Do not use towel bars as grab bars.  Use non-skid mats or decals in the tub or shower.  If you need to sit down in the shower, use a plastic, non-slip stool.  Keep the floor dry. Clean up any water that spills on the floor as soon as it happens.  Remove soap buildup in the tub or shower regularly.  Attach bath mats securely with double-sided non-slip rug tape.  Do not have throw rugs and other things on the floor that can make you trip. What can I do in the bedroom?  Use night lights.  Make sure that you have a light by your bed that is easy to reach.  Do not use any sheets or blankets that are too big for your bed. They should not hang down onto the floor.  Have a firm chair that has side arms. You can use this for support while you get dressed.  Do not have throw rugs and other things on the floor that can make you trip. What can I do in  the kitchen?  Clean up any spills right away.  Avoid walking on wet floors.  Keep items that you use a lot in easy-to-reach places.  If you need to reach something above you, use a strong step stool that has a grab bar.  Keep electrical cords out of the way.  Do not use floor polish or wax that makes floors slippery. If you must use wax, use non-skid floor wax.  Do not have throw rugs and other things on the floor that can make you trip. What can I do with my stairs?  Do not leave any items on the stairs.  Make sure that there are handrails on both sides of the stairs and use them. Fix handrails that are broken or loose. Make sure that handrails are as long as the stairways.  Check any carpeting to make sure that it is firmly attached to the stairs. Fix any carpet that is loose or worn.  Avoid having throw rugs at the top or bottom of the stairs. If you do have throw rugs,  attach them to the floor with carpet tape.  Make sure that you have a light switch at the top of the stairs and the bottom of the stairs. If you do not have them, ask someone to add them for you. What else can I do to help prevent falls?  Wear shoes that:  Do not have high heels.  Have rubber bottoms.  Are comfortable and fit you well.  Are closed at the toe. Do not wear sandals.  If you use a stepladder:  Make sure that it is fully opened. Do not climb a closed stepladder.  Make sure that both sides of the stepladder are locked into place.  Ask someone to hold it for you, if possible.  Clearly mark and make sure that you can see:  Any grab bars or handrails.  First and last steps.  Where the edge of each step is.  Use tools that help you move around (mobility aids) if they are needed. These include:  Canes.  Walkers.  Scooters.  Crutches.  Turn on the lights when you go into a dark area. Replace any light bulbs as soon as they burn out.  Set up your furniture so you have a clear path. Avoid moving your furniture around.  If any of your floors are uneven, fix them.  If there are any pets around you, be aware of where they are.  Review your medicines with your doctor. Some medicines can make you feel dizzy. This can increase your chance of falling. Ask your doctor what other things that you can do to help prevent falls. This information is not intended to replace advice given to you by your health care provider. Make sure you discuss any questions you have with your health care provider. Document Released: 10/12/2009 Document Revised: 05/23/2016 Document Reviewed: 01/20/2015 Elsevier Interactive Patient Education  2017 [employer].

## 2019-06-11 DIAGNOSIS — N2581 Secondary hyperparathyroidism of renal origin: Secondary | ICD-10-CM | POA: Diagnosis not present

## 2019-06-11 DIAGNOSIS — N186 End stage renal disease: Secondary | ICD-10-CM | POA: Diagnosis not present

## 2019-06-14 DIAGNOSIS — N2581 Secondary hyperparathyroidism of renal origin: Secondary | ICD-10-CM | POA: Diagnosis not present

## 2019-06-14 DIAGNOSIS — N186 End stage renal disease: Secondary | ICD-10-CM | POA: Diagnosis not present

## 2019-06-16 DIAGNOSIS — N2581 Secondary hyperparathyroidism of renal origin: Secondary | ICD-10-CM | POA: Diagnosis not present

## 2019-06-16 DIAGNOSIS — N186 End stage renal disease: Secondary | ICD-10-CM | POA: Diagnosis not present

## 2019-06-17 ENCOUNTER — Other Ambulatory Visit: Payer: Self-pay

## 2019-06-17 DIAGNOSIS — N186 End stage renal disease: Secondary | ICD-10-CM

## 2019-06-17 DIAGNOSIS — Z992 Dependence on renal dialysis: Secondary | ICD-10-CM

## 2019-06-18 DIAGNOSIS — N2581 Secondary hyperparathyroidism of renal origin: Secondary | ICD-10-CM | POA: Diagnosis not present

## 2019-06-18 DIAGNOSIS — N186 End stage renal disease: Secondary | ICD-10-CM | POA: Diagnosis not present

## 2019-06-20 ENCOUNTER — Other Ambulatory Visit: Payer: Self-pay | Admitting: Hematology

## 2019-06-20 DIAGNOSIS — I82412 Acute embolism and thrombosis of left femoral vein: Secondary | ICD-10-CM

## 2019-06-21 ENCOUNTER — Telehealth (HOSPITAL_COMMUNITY): Payer: Self-pay | Admitting: *Deleted

## 2019-06-21 DIAGNOSIS — N2581 Secondary hyperparathyroidism of renal origin: Secondary | ICD-10-CM | POA: Diagnosis not present

## 2019-06-21 DIAGNOSIS — N186 End stage renal disease: Secondary | ICD-10-CM | POA: Diagnosis not present

## 2019-06-21 NOTE — Telephone Encounter (Signed)
The above patient or their representative was contacted and gave the following answers to these questions:         Do you have any of the following symptoms?n  Fever                    Cough                   Shortness of breath  Do  you have any of the following other symptoms? n muscle pain         vomiting,        diarrhea        rash         weakness        red eye        abdominal pain         bruising          bruising or bleeding              joint pain           severe headache    Have you been in contact with someone who was or has been sick in the past 2 weeks?n  Yes                 Unsure                         Unable to assess   Does the person that you were in contact with have any of the following symptoms?   Cough         shortness of breath           muscle pain         vomiting,            diarrhea            rash            weakness           fever            red eye           abdominal pain           bruising  or  bleeding                joint pain                severe headache               Have you  or someone you have been in contact with traveled internationally in th last month?   n      If yes, which countries?   Have you  or someone you have been in contact with traveled outside [place] in th last month?         If yes, which state and city?   COMMENTS OR ACTION PLAN FOR THIS PATIENT:

## 2019-06-21 NOTE — Telephone Encounter (Signed)
The above patient or their representative was contacted and gave the following answers to these questions:         Do you have any of the following symptoms?n  Fever                    Cough                   Shortness of breath  Do  you have any of the following other symptoms? n   muscle pain         vomiting,        diarrhea        rash         weakness        red eye        abdominal pain         bruising          bruising or bleeding              joint pain           severe headache    Have you been in contact with someone who was or has been sick in the past 2 weeks?n  Yes                 Unsure                         Unable to assess   Does the person that you were in contact with have any of the following symptoms?   Cough         shortness of breath           muscle pain         vomiting,            diarrhea            rash            weakness           fever            red eye           abdominal pain           bruising  or  bleeding                joint pain                severe headache               Have you  or someone you have been in contact with traveled internationally in th last month?   n      If yes, which countries?   Have you  or someone you have been in contact with traveled outside  in th last month?   n      If yes, which state and city?   COMMENTS OR ACTION PLAN FOR THIS PATIENT:         

## 2019-06-21 NOTE — Telephone Encounter (Signed)
The above patient or their representative was contacted and gave the following answers to these questions:         Do you have any of the following symptoms?n  Fever                    Cough                   Shortness of breath  Do  you have any of the following other symptoms? n   muscle pain         vomiting,        diarrhea        rash         weakness        red eye        abdominal pain         bruising          bruising or bleeding              joint pain           severe headache    Have you been in contact with someone who was or has been sick in the past 2 weeks?n  Yes                 Unsure                         Unable to assess   Does the person that you were in contact with have any of the following symptoms?   Cough         shortness of breath           muscle pain         vomiting,            diarrhea            rash            weakness           fever            red eye           abdominal pain           bruising  or  bleeding                joint pain                severe headache               Have you  or someone you have been in contact with traveled internationally in th last month?   n      If yes, which countries?   Have you  or someone you have been in contact with traveled outside [place] in th last month?   n      If yes, which state and city?   COMMENTS OR ACTION PLAN FOR THIS PATIENT:         

## 2019-06-22 ENCOUNTER — Ambulatory Visit (INDEPENDENT_AMBULATORY_CARE_PROVIDER_SITE_OTHER): Payer: Medicare Other | Admitting: Vascular Surgery

## 2019-06-22 ENCOUNTER — Ambulatory Visit (HOSPITAL_COMMUNITY)
Admission: RE | Admit: 2019-06-22 | Discharge: 2019-06-22 | Disposition: A | Discharge status: Home / Self Care | Payer: Medicare Other | Source: Ambulatory Visit | Attending: Family | Admitting: Family

## 2019-06-22 ENCOUNTER — Other Ambulatory Visit: Payer: Self-pay

## 2019-06-22 ENCOUNTER — Encounter: Payer: Self-pay | Admitting: Vascular Surgery

## 2019-06-22 ENCOUNTER — Ambulatory Visit (INDEPENDENT_AMBULATORY_CARE_PROVIDER_SITE_OTHER)
Admission: RE | Admit: 2019-06-22 | Discharge: 2019-06-22 | Disposition: A | Discharge status: Home / Self Care | Payer: Medicare Other | Source: Ambulatory Visit | Attending: Family | Admitting: Family

## 2019-06-22 VITALS — BP 115/75 | HR 84 | Temp 97.3°F | Resp 20 | Ht 62.0 in | Wt 192.0 lb

## 2019-06-22 DIAGNOSIS — N186 End stage renal disease: Secondary | ICD-10-CM

## 2019-06-22 DIAGNOSIS — Z992 Dependence on renal dialysis: Secondary | ICD-10-CM

## 2019-06-22 NOTE — Progress Notes (Signed)
[facility] [place]  Patient name: Whitney Walker MRN: [id number] DOB: August 01, 1956 Sex: female  REASON FOR VISIT: Discuss access for hemodialysis  HPI: Whitney Walker is a 63 y.o. female here today for discussion of access for hemodialysis.  She currently is having dialysis via a right IJ tunneled catheter.  She had had placement of a left brachiocephalic fistula with Dr. Donzetta Matters in May 2019.  She had infiltration of this and subsequently underwent basilic vein transposition on the left.  She also eventually suffered infiltration of this and is now using her catheter.  She is seen today to discuss other options.  She has not had any right hand access.  He has had to have her catheter exchanged for poor function but has not had any catheter-based infections  Past Medical History:  Diagnosis Date  . Anemia   . Anxiety   . CHF (congestive heart failure) ([place])   . Colon cancer [facility])    treatment surgery  . Complication of anesthesia    after first C- [place] "couldnt walk after", patient denies having a spinal  . COPD (chronic obstructive pulmonary disease) ([place])   . Coronary artery disease   . Depression   . DVT (deep venous thrombosis) ([place])   . ESRD (end stage renal disease) ([place])    Hemo: MWF  . History of blood transfusion 04/2018  . Hypertension    07/07/18- no longer takes BP medications  . Meningitis   . PE (pulmonary embolism)   . Peripheral vascular disease ([place])   . Restless legs   . Shortness of breath    with exertion  . Sleep apnea   . Vertigo     Family History  Problem Relation Age of Onset  . Cancer Mother 36       breast and bone  . Cancer Father 71       prostate  . Hypertension Sister   . Bleeding Disorder Sister   . Cancer Cousin 20       breast cancer   . Hypertension Daughter   . Breast cancer Neg Hx     SOCIAL HISTORY: Social History   Tobacco Use  . Smoking status: Current Some Day Smoker     Packs/day: 0.00    Years: 40.00    Pack years: 0.00    Types: Cigarettes  . Smokeless tobacco: Former Systems developer  . Tobacco comment: has an occasional cigarette  Substance Use Topics  . Alcohol use: No    Alcohol/week: 0.0 standard drinks    Comment: she used to drink alcohol, quit in 2010     Allergies  Allergen Reactions  . Gadolinium Derivatives Hives and Other (See Comments)    HIVES, Desc: HIVES W/ "DYE" USED FOR 1ST CT SCAN BUT NOT 2ND, NO PREMEDS USED, PT UNCERTAIN OF CIRCUMSTANCES,,?POSSIBLE MRI CONTRAST ALLERGY, ALL STUDIES DONE "SOMEWHERE" IN [place]//A.C., Onset Date: [id number]  . Iohexol Other (See Comments)     Code: HIVES, Desc: HIVES W/ "DYE" USED FOR 1ST CT SCAN BUT NOT 2ND, NO PREMEDS USED, PT UNCERTAIN OF CIRCUMSTANCES,,?POSSIBLE MRI CONTRAST ALLERGY, ALL STUDIES DONE "SOMEWHERE" IN [place]//A.C., Onset Date: [id number]   . Iodine Hives    Current Outpatient Medications  Medication Sig Dispense Refill  . acetaminophen (TYLENOL) 500 MG tablet Take 1,000 mg by mouth 2 (two) times daily as needed for moderate pain or headache.    . albuterol (PROVENTIL HFA;VENTOLIN HFA) 108 (90 Base) MCG/ACT inhaler Inhale 2 puffs into the  lungs every 6 (six) hours as needed for wheezing or shortness of breath.     [name] b complex-vitamin c-folic acid (NEPHRO-VITE) 0.8 MG TABS tablet Take 1 tablet by mouth at bedtime.    . Biotin 10000 MCG TABS Take by mouth.    . carvedilol (COREG) 6.25 MG tablet Take 6.25 mg by mouth 2 (two) times daily with a meal. Only takes once a day on dialysis days (holds in morning on Monday Wednesday and Friday)    . ELIQUIS 2.5 MG TABS tablet TAKE 1 TABLET(2.5 MG) BY MOUTH TWICE DAILY 60 tablet 1  . ferric citrate (AURYXIA) 1 GM 210 MG(Fe) tablet Take 210-420 mg by mouth See admin instructions. Take 210-420 mg with each meal (based on size of meal) and 210 mg with each snack     . meclizine (ANTIVERT) 12.5 MG tablet Take 12.5 mg by mouth 3 (three) times daily  as needed for dizziness.    . mupirocin ointment (BACTROBAN) 2 % Apply 1 application topically daily as needed (skin bumps). 22 g 2  . NYSTATIN powder Apply 1 Bottle topically 2 (two) times daily as needed (dryness).   2   No current facility-administered medications for this visit.     REVIEW OF SYSTEMS:  [X]  denotes positive finding, [ ]  denotes negative finding Cardiac  Comments:  Chest pain or chest pressure:    Shortness of breath upon exertion:    Short of breath when lying flat:    Irregular heart rhythm:        Vascular    Pain in calf, thigh, or hip brought on by ambulation: x   Pain in feet at night that wakes you up from your sleep:     Blood clot in your veins:    Leg swelling:           PHYSICAL EXAM: Vitals:   06/22/19 1041  BP: 115/75  Pulse: 84  Resp: 20  Temp: (!) 97.3 F (36.3 C)  SpO2: 97%  Weight: 87.1 kg  Height: 5\' 2"  (1.575 m)    GENERAL: The patient is a well-nourished female, in no acute distress. The vital signs are documented above. CARDIOVASCULAR: 2+ radial pulses bilaterally.  Small surface veins bilaterally PULMONARY: There is good air exchange  MUSCULOSKELETAL: There are no major deformities or cyanosis. NEUROLOGIC: No focal weakness or paresthesias are detected. SKIN: There are no ulcers or rashes noted. PSYCHIATRIC: The patient has a normal affect.  DATA:  Duplex revealed large caliber cephalic vein from her antecubital space proximal on the right.  Small basilic vein on the right.  MEDICAL ISSUES: I discussed options with the patient.  She does appear to be a candidate for brachiobasilic fistula.  Patient is very adamant that she does not want arm access.  She reports she has had 24 surgeries in is not willing to proceed at this time.  She does understand the potential risk for infection with long-term use of tunneled catheter and is willing to accept this.  I explained her other option would be Gore-Tex graft in the left arm.  She  understands her options and will consider these if she is willing to proceed to have her arm access and eventual catheter removal.  She will see [place] again on an as-needed basis    Rosetta Posner, MD [facility] Tel [phone] Pager [phone]

## 2019-06-23 ENCOUNTER — Other Ambulatory Visit: Payer: Self-pay | Admitting: *Deleted

## 2019-06-23 DIAGNOSIS — N2581 Secondary hyperparathyroidism of renal origin: Secondary | ICD-10-CM | POA: Diagnosis not present

## 2019-06-23 DIAGNOSIS — N186 End stage renal disease: Secondary | ICD-10-CM | POA: Diagnosis not present

## 2019-06-23 DIAGNOSIS — I82412 Acute embolism and thrombosis of left femoral vein: Secondary | ICD-10-CM

## 2019-06-23 MED ORDER — APIXABAN 2.5 MG PO TABS: 2.5000 mg | ORAL_TABLET | Freq: Two times a day (BID) | ORAL | 1 refills | Status: DC

## 2019-06-25 DIAGNOSIS — N186 End stage renal disease: Secondary | ICD-10-CM | POA: Diagnosis not present

## 2019-06-25 DIAGNOSIS — N2581 Secondary hyperparathyroidism of renal origin: Secondary | ICD-10-CM | POA: Diagnosis not present

## 2019-06-28 DIAGNOSIS — N2581 Secondary hyperparathyroidism of renal origin: Secondary | ICD-10-CM | POA: Diagnosis not present

## 2019-06-28 DIAGNOSIS — N186 End stage renal disease: Secondary | ICD-10-CM | POA: Diagnosis not present

## 2019-06-30 DIAGNOSIS — N2581 Secondary hyperparathyroidism of renal origin: Secondary | ICD-10-CM | POA: Diagnosis not present

## 2019-06-30 DIAGNOSIS — D689 Coagulation defect, unspecified: Secondary | ICD-10-CM | POA: Diagnosis not present

## 2019-06-30 DIAGNOSIS — I129 Hypertensive chronic kidney disease with stage 1 through stage 4 chronic kidney disease, or unspecified chronic kidney disease: Secondary | ICD-10-CM | POA: Diagnosis not present

## 2019-06-30 DIAGNOSIS — N186 End stage renal disease: Secondary | ICD-10-CM | POA: Diagnosis not present

## 2019-06-30 DIAGNOSIS — Z992 Dependence on renal dialysis: Secondary | ICD-10-CM | POA: Diagnosis not present

## 2019-07-05 DIAGNOSIS — N2581 Secondary hyperparathyroidism of renal origin: Secondary | ICD-10-CM | POA: Diagnosis not present

## 2019-07-05 DIAGNOSIS — D689 Coagulation defect, unspecified: Secondary | ICD-10-CM | POA: Diagnosis not present

## 2019-07-05 DIAGNOSIS — N186 End stage renal disease: Secondary | ICD-10-CM | POA: Diagnosis not present

## 2019-07-07 DIAGNOSIS — N2581 Secondary hyperparathyroidism of renal origin: Secondary | ICD-10-CM | POA: Diagnosis not present

## 2019-07-07 DIAGNOSIS — D689 Coagulation defect, unspecified: Secondary | ICD-10-CM | POA: Diagnosis not present

## 2019-07-07 DIAGNOSIS — N186 End stage renal disease: Secondary | ICD-10-CM | POA: Diagnosis not present

## 2019-07-09 DIAGNOSIS — N2581 Secondary hyperparathyroidism of renal origin: Secondary | ICD-10-CM | POA: Diagnosis not present

## 2019-07-09 DIAGNOSIS — D689 Coagulation defect, unspecified: Secondary | ICD-10-CM | POA: Diagnosis not present

## 2019-07-09 DIAGNOSIS — N186 End stage renal disease: Secondary | ICD-10-CM | POA: Diagnosis not present

## 2019-07-12 DIAGNOSIS — N186 End stage renal disease: Secondary | ICD-10-CM | POA: Diagnosis not present

## 2019-07-12 DIAGNOSIS — D689 Coagulation defect, unspecified: Secondary | ICD-10-CM | POA: Diagnosis not present

## 2019-07-12 DIAGNOSIS — N2581 Secondary hyperparathyroidism of renal origin: Secondary | ICD-10-CM | POA: Diagnosis not present

## 2019-07-14 DIAGNOSIS — D689 Coagulation defect, unspecified: Secondary | ICD-10-CM | POA: Diagnosis not present

## 2019-07-14 DIAGNOSIS — N2581 Secondary hyperparathyroidism of renal origin: Secondary | ICD-10-CM | POA: Diagnosis not present

## 2019-07-14 DIAGNOSIS — N186 End stage renal disease: Secondary | ICD-10-CM | POA: Diagnosis not present

## 2019-07-16 DIAGNOSIS — N2581 Secondary hyperparathyroidism of renal origin: Secondary | ICD-10-CM | POA: Diagnosis not present

## 2019-07-16 DIAGNOSIS — N186 End stage renal disease: Secondary | ICD-10-CM | POA: Diagnosis not present

## 2019-07-16 DIAGNOSIS — D689 Coagulation defect, unspecified: Secondary | ICD-10-CM | POA: Diagnosis not present

## 2019-07-19 DIAGNOSIS — N186 End stage renal disease: Secondary | ICD-10-CM | POA: Diagnosis not present

## 2019-07-19 DIAGNOSIS — N2581 Secondary hyperparathyroidism of renal origin: Secondary | ICD-10-CM | POA: Diagnosis not present

## 2019-07-19 DIAGNOSIS — D689 Coagulation defect, unspecified: Secondary | ICD-10-CM | POA: Diagnosis not present

## 2019-07-20 ENCOUNTER — Other Ambulatory Visit: Payer: Self-pay

## 2019-07-20 ENCOUNTER — Encounter: Payer: Self-pay | Admitting: Nurse Practitioner

## 2019-07-20 ENCOUNTER — Ambulatory Visit (INDEPENDENT_AMBULATORY_CARE_PROVIDER_SITE_OTHER): Payer: Medicare Other | Admitting: Nurse Practitioner

## 2019-07-20 VITALS — BP 102/64 | HR 96 | Temp 98.5°F | Ht 61.2 in | Wt 194.0 lb

## 2019-07-20 DIAGNOSIS — H538 Other visual disturbances: Secondary | ICD-10-CM | POA: Diagnosis not present

## 2019-07-20 DIAGNOSIS — R21 Rash and other nonspecific skin eruption: Secondary | ICD-10-CM | POA: Diagnosis not present

## 2019-07-20 MED ORDER — MOMETASONE FUROATE 0.1 % EX CREA: TOPICAL_CREAM | CUTANEOUS | 1 refills | Status: AC

## 2019-07-20 NOTE — Progress Notes (Signed)
Subjective:     Patient ID: Whitney Walker , female    DOB: [birth year] , 63 y.o.   MRN: [id number]   Chief Complaint  Patient presents with  . Referral    patient wants a referral to dermatology. patient states she has been itching  . Referral    patient would also like a referral to an eye doctor    HPI  Here for dermatology referral - she has a skin rash all over her body with a little scab and constant itching.  She has been taking hydroxyzine with out relief.  Ineffective, she has had her dialyzer changed and not improved.  It was not her phosphorus.    She would also like to be seen by an eye doctor she has not seen anyone in the last year. Describes fogginess all times.      Past Medical History:  Diagnosis Date  . Anemia   . Anxiety   . CHF (congestive heart failure) ([place])   . Colon cancer [facility])    treatment surgery  . Complication of anesthesia    after first C- [place] "couldnt walk after", patient denies having a spinal  . COPD (chronic obstructive pulmonary disease) ([place])   . Coronary artery disease   . Depression   . DVT (deep venous thrombosis) ([place])   . ESRD (end stage renal disease) ([place])    Hemo: MWF  . History of blood transfusion 04/2018  . Hypertension    07/07/18- no longer takes BP medications  . Meningitis   . PE (pulmonary embolism)   . Peripheral vascular disease ([place])   . Restless legs   . Shortness of breath    with exertion  . Sleep apnea   . Vertigo      Family History  Problem Relation Age of Onset  . Cancer Mother 6       breast and bone  . Cancer Father 61       prostate  . Hypertension Sister   . Bleeding Disorder Sister   . Cancer Cousin 20       breast cancer   . Hypertension Daughter   . Breast cancer Neg Hx      Current Outpatient Medications:  .  acetaminophen (TYLENOL) 500 MG tablet, Take 1,000 mg by mouth 2 (two) times daily as needed for moderate pain or headache., Disp: , Rfl:  .  albuterol (PROVENTIL HFA;VENTOLIN  HFA) 108 (90 Base) MCG/ACT inhaler, Inhale 2 puffs into the lungs every 6 (six) hours as needed for wheezing or shortness of breath. , Disp: , Rfl:  .  apixaban (ELIQUIS) 2.5 MG TABS tablet, Take 1 tablet (2.5 mg total) by mouth 2 (two) times daily., Disp: 60 tablet, Rfl: 1 .  b complex-vitamin c-folic acid (NEPHRO-VITE) 0.8 MG TABS tablet, Take 1 tablet by mouth at bedtime., Disp: , Rfl:  .  Biotin 10000 MCG TABS, Take by mouth., Disp: , Rfl:  .  carvedilol (COREG) 6.25 MG tablet, Take 6.25 mg by mouth 2 (two) times daily with a meal. Only takes once a day on dialysis days (holds in morning on Monday Wednesday and Friday), Disp: , Rfl:  .  ferric citrate (AURYXIA) 1 GM 210 MG(Fe) tablet, Take 210-420 mg by mouth See admin instructions. Take 210-420 mg with each meal (based on size of meal) and 210 mg with each snack , Disp: , Rfl:  .  meclizine (ANTIVERT) 12.5 MG tablet, Take 12.5 mg by mouth 3 (three) times  daily as needed for dizziness., Disp: , Rfl:  .  mupirocin ointment (BACTROBAN) 2 %, Apply 1 application topically daily as needed (skin bumps)., Disp: 22 g, Rfl: 2 .  NYSTATIN powder, Apply 1 Bottle topically 2 (two) times daily as needed (dryness). , Disp: , Rfl: 2   Allergies  Allergen Reactions  . Gadolinium Derivatives Hives and Other (See Comments)    HIVES, Desc: HIVES W/ "DYE" USED FOR 1ST CT SCAN BUT NOT 2ND, NO PREMEDS USED, PT UNCERTAIN OF CIRCUMSTANCES,,?POSSIBLE MRI CONTRAST ALLERGY, ALL STUDIES DONE "SOMEWHERE" IN [place]//A.C., Onset Date: [id number]  . Iohexol Other (See Comments)     Code: HIVES, Desc: HIVES W/ "DYE" USED FOR 1ST CT SCAN BUT NOT 2ND, NO PREMEDS USED, PT UNCERTAIN OF CIRCUMSTANCES,,?POSSIBLE MRI CONTRAST ALLERGY, ALL STUDIES DONE "SOMEWHERE" IN [place]//A.C., Onset Date: [id number]   . Iodine Hives     Review of Systems   Today's Vitals   07/20/19 1223  BP: 102/64  Pulse: 96  Temp: 98.5 F (36.9 C)  TempSrc: Oral  Weight: 194 lb (88 kg)   Height: 5' 1.2" (1.554 m)  PainSc: 0-No pain   Body mass index is 36.42 kg/m.   Objective:  Physical Exam Constitutional:      Appearance: Normal appearance.  Cardiovascular:     Rate and Rhythm: Normal rate and regular rhythm.     Pulses: Normal pulses.     Heart sounds: Normal heart sounds. No murmur.  Pulmonary:     Effort: No respiratory distress.  Skin:    General: Skin is dry.  Neurological:     General: No focal deficit present.     Mental Status: She is alert and oriented to person, place, and time.  Psychiatric:        Mood and Affect: Mood normal.        Behavior: Behavior normal.        Thought Content: Thought content normal.        Judgment: Judgment normal.         Assessment And Plan:     1. Rash and nonspecific skin eruption Small papular vesicles present to arms and chest area She has had some changes with her fluids at dialysis which has not helped to include removing some phosporus Will refer to dermatology - Ambulatory referral to Dermatology  2. Blurred vision, left eye  She reports blurred vision to left eye that has been ongoing  With her previous history of diabetes will refer to opthamology - Ambulatory referral to Ophthalmology   [name], FNP    THE PATIENT IS ENCOURAGED TO PRACTICE SOCIAL DISTANCING DUE TO THE COVID-19 PANDEMIC.

## 2019-07-21 DIAGNOSIS — D689 Coagulation defect, unspecified: Secondary | ICD-10-CM | POA: Diagnosis not present

## 2019-07-21 DIAGNOSIS — N2581 Secondary hyperparathyroidism of renal origin: Secondary | ICD-10-CM | POA: Diagnosis not present

## 2019-07-21 DIAGNOSIS — N186 End stage renal disease: Secondary | ICD-10-CM | POA: Diagnosis not present

## 2019-07-23 DIAGNOSIS — N186 End stage renal disease: Secondary | ICD-10-CM | POA: Diagnosis not present

## 2019-07-23 DIAGNOSIS — D689 Coagulation defect, unspecified: Secondary | ICD-10-CM | POA: Diagnosis not present

## 2019-07-23 DIAGNOSIS — N2581 Secondary hyperparathyroidism of renal origin: Secondary | ICD-10-CM | POA: Diagnosis not present

## 2019-07-26 DIAGNOSIS — D689 Coagulation defect, unspecified: Secondary | ICD-10-CM | POA: Diagnosis not present

## 2019-07-26 DIAGNOSIS — N2581 Secondary hyperparathyroidism of renal origin: Secondary | ICD-10-CM | POA: Diagnosis not present

## 2019-07-26 DIAGNOSIS — N186 End stage renal disease: Secondary | ICD-10-CM | POA: Diagnosis not present

## 2019-07-28 DIAGNOSIS — N2581 Secondary hyperparathyroidism of renal origin: Secondary | ICD-10-CM | POA: Diagnosis not present

## 2019-07-28 DIAGNOSIS — N186 End stage renal disease: Secondary | ICD-10-CM | POA: Diagnosis not present

## 2019-07-28 DIAGNOSIS — D689 Coagulation defect, unspecified: Secondary | ICD-10-CM | POA: Diagnosis not present

## 2019-07-30 DIAGNOSIS — N2581 Secondary hyperparathyroidism of renal origin: Secondary | ICD-10-CM | POA: Diagnosis not present

## 2019-07-30 DIAGNOSIS — D689 Coagulation defect, unspecified: Secondary | ICD-10-CM | POA: Diagnosis not present

## 2019-07-30 DIAGNOSIS — N186 End stage renal disease: Secondary | ICD-10-CM | POA: Diagnosis not present

## 2019-07-31 DIAGNOSIS — Z992 Dependence on renal dialysis: Secondary | ICD-10-CM | POA: Diagnosis not present

## 2019-07-31 DIAGNOSIS — N186 End stage renal disease: Secondary | ICD-10-CM | POA: Diagnosis not present

## 2019-07-31 DIAGNOSIS — I129 Hypertensive chronic kidney disease with stage 1 through stage 4 chronic kidney disease, or unspecified chronic kidney disease: Secondary | ICD-10-CM | POA: Diagnosis not present

## 2019-08-02 ENCOUNTER — Ambulatory Visit: Payer: Medicare Other | Admitting: Nurse Practitioner

## 2019-08-02 DIAGNOSIS — Z992 Dependence on renal dialysis: Secondary | ICD-10-CM | POA: Diagnosis not present

## 2019-08-02 DIAGNOSIS — N2581 Secondary hyperparathyroidism of renal origin: Secondary | ICD-10-CM | POA: Diagnosis not present

## 2019-08-02 DIAGNOSIS — N186 End stage renal disease: Secondary | ICD-10-CM | POA: Diagnosis not present

## 2019-08-02 DIAGNOSIS — D631 Anemia in chronic kidney disease: Secondary | ICD-10-CM | POA: Diagnosis not present

## 2019-08-02 DIAGNOSIS — A498 Other bacterial infections of unspecified site: Secondary | ICD-10-CM | POA: Diagnosis not present

## 2019-08-03 ENCOUNTER — Ambulatory Visit: Payer: Medicare Other | Admitting: Nurse Practitioner

## 2019-08-04 DIAGNOSIS — Z992 Dependence on renal dialysis: Secondary | ICD-10-CM | POA: Diagnosis not present

## 2019-08-04 DIAGNOSIS — D631 Anemia in chronic kidney disease: Secondary | ICD-10-CM | POA: Diagnosis not present

## 2019-08-04 DIAGNOSIS — A498 Other bacterial infections of unspecified site: Secondary | ICD-10-CM | POA: Diagnosis not present

## 2019-08-04 DIAGNOSIS — N186 End stage renal disease: Secondary | ICD-10-CM | POA: Diagnosis not present

## 2019-08-04 DIAGNOSIS — N2581 Secondary hyperparathyroidism of renal origin: Secondary | ICD-10-CM | POA: Diagnosis not present

## 2019-08-06 DIAGNOSIS — Z992 Dependence on renal dialysis: Secondary | ICD-10-CM | POA: Diagnosis not present

## 2019-08-06 DIAGNOSIS — N186 End stage renal disease: Secondary | ICD-10-CM | POA: Diagnosis not present

## 2019-08-06 DIAGNOSIS — D631 Anemia in chronic kidney disease: Secondary | ICD-10-CM | POA: Diagnosis not present

## 2019-08-06 DIAGNOSIS — N2581 Secondary hyperparathyroidism of renal origin: Secondary | ICD-10-CM | POA: Diagnosis not present

## 2019-08-06 DIAGNOSIS — A498 Other bacterial infections of unspecified site: Secondary | ICD-10-CM | POA: Diagnosis not present

## 2019-08-09 DIAGNOSIS — D631 Anemia in chronic kidney disease: Secondary | ICD-10-CM | POA: Diagnosis not present

## 2019-08-09 DIAGNOSIS — Z992 Dependence on renal dialysis: Secondary | ICD-10-CM | POA: Diagnosis not present

## 2019-08-09 DIAGNOSIS — N2581 Secondary hyperparathyroidism of renal origin: Secondary | ICD-10-CM | POA: Diagnosis not present

## 2019-08-09 DIAGNOSIS — A498 Other bacterial infections of unspecified site: Secondary | ICD-10-CM | POA: Diagnosis not present

## 2019-08-09 DIAGNOSIS — N186 End stage renal disease: Secondary | ICD-10-CM | POA: Diagnosis not present

## 2019-08-11 DIAGNOSIS — N2581 Secondary hyperparathyroidism of renal origin: Secondary | ICD-10-CM | POA: Diagnosis not present

## 2019-08-11 DIAGNOSIS — Z992 Dependence on renal dialysis: Secondary | ICD-10-CM | POA: Diagnosis not present

## 2019-08-11 DIAGNOSIS — N186 End stage renal disease: Secondary | ICD-10-CM | POA: Diagnosis not present

## 2019-08-11 DIAGNOSIS — D631 Anemia in chronic kidney disease: Secondary | ICD-10-CM | POA: Diagnosis not present

## 2019-08-11 DIAGNOSIS — A498 Other bacterial infections of unspecified site: Secondary | ICD-10-CM | POA: Diagnosis not present

## 2019-08-13 DIAGNOSIS — D631 Anemia in chronic kidney disease: Secondary | ICD-10-CM | POA: Diagnosis not present

## 2019-08-13 DIAGNOSIS — N2581 Secondary hyperparathyroidism of renal origin: Secondary | ICD-10-CM | POA: Diagnosis not present

## 2019-08-13 DIAGNOSIS — N186 End stage renal disease: Secondary | ICD-10-CM | POA: Diagnosis not present

## 2019-08-13 DIAGNOSIS — A498 Other bacterial infections of unspecified site: Secondary | ICD-10-CM | POA: Diagnosis not present

## 2019-08-13 DIAGNOSIS — Z992 Dependence on renal dialysis: Secondary | ICD-10-CM | POA: Diagnosis not present

## 2019-08-16 DIAGNOSIS — N186 End stage renal disease: Secondary | ICD-10-CM | POA: Diagnosis not present

## 2019-08-16 DIAGNOSIS — N2581 Secondary hyperparathyroidism of renal origin: Secondary | ICD-10-CM | POA: Diagnosis not present

## 2019-08-16 DIAGNOSIS — A498 Other bacterial infections of unspecified site: Secondary | ICD-10-CM | POA: Diagnosis not present

## 2019-08-16 DIAGNOSIS — Z992 Dependence on renal dialysis: Secondary | ICD-10-CM | POA: Diagnosis not present

## 2019-08-16 DIAGNOSIS — D631 Anemia in chronic kidney disease: Secondary | ICD-10-CM | POA: Diagnosis not present

## 2019-08-18 ENCOUNTER — Other Ambulatory Visit: Payer: Self-pay

## 2019-08-18 DIAGNOSIS — I82412 Acute embolism and thrombosis of left femoral vein: Secondary | ICD-10-CM

## 2019-08-18 DIAGNOSIS — A498 Other bacterial infections of unspecified site: Secondary | ICD-10-CM | POA: Diagnosis not present

## 2019-08-18 DIAGNOSIS — N2581 Secondary hyperparathyroidism of renal origin: Secondary | ICD-10-CM | POA: Diagnosis not present

## 2019-08-18 DIAGNOSIS — D631 Anemia in chronic kidney disease: Secondary | ICD-10-CM | POA: Diagnosis not present

## 2019-08-18 DIAGNOSIS — Z992 Dependence on renal dialysis: Secondary | ICD-10-CM | POA: Diagnosis not present

## 2019-08-18 DIAGNOSIS — N186 End stage renal disease: Secondary | ICD-10-CM | POA: Diagnosis not present

## 2019-08-18 MED ORDER — APIXABAN 2.5 MG PO TABS: 2.5000 mg | ORAL_TABLET | Freq: Two times a day (BID) | ORAL | 1 refills | Status: DC

## 2019-08-20 DIAGNOSIS — D631 Anemia in chronic kidney disease: Secondary | ICD-10-CM | POA: Diagnosis not present

## 2019-08-20 DIAGNOSIS — A498 Other bacterial infections of unspecified site: Secondary | ICD-10-CM | POA: Diagnosis not present

## 2019-08-20 DIAGNOSIS — N186 End stage renal disease: Secondary | ICD-10-CM | POA: Diagnosis not present

## 2019-08-20 DIAGNOSIS — N2581 Secondary hyperparathyroidism of renal origin: Secondary | ICD-10-CM | POA: Diagnosis not present

## 2019-08-20 DIAGNOSIS — Z992 Dependence on renal dialysis: Secondary | ICD-10-CM | POA: Diagnosis not present

## 2019-08-23 DIAGNOSIS — A498 Other bacterial infections of unspecified site: Secondary | ICD-10-CM | POA: Diagnosis not present

## 2019-08-23 DIAGNOSIS — N186 End stage renal disease: Secondary | ICD-10-CM | POA: Diagnosis not present

## 2019-08-23 DIAGNOSIS — D631 Anemia in chronic kidney disease: Secondary | ICD-10-CM | POA: Diagnosis not present

## 2019-08-23 DIAGNOSIS — N2581 Secondary hyperparathyroidism of renal origin: Secondary | ICD-10-CM | POA: Diagnosis not present

## 2019-08-23 DIAGNOSIS — Z992 Dependence on renal dialysis: Secondary | ICD-10-CM | POA: Diagnosis not present

## 2019-08-24 ENCOUNTER — Ambulatory Visit: Payer: Medicare Other | Admitting: Nurse Practitioner

## 2019-08-25 DIAGNOSIS — N2581 Secondary hyperparathyroidism of renal origin: Secondary | ICD-10-CM | POA: Diagnosis not present

## 2019-08-25 DIAGNOSIS — A498 Other bacterial infections of unspecified site: Secondary | ICD-10-CM | POA: Diagnosis not present

## 2019-08-25 DIAGNOSIS — Z992 Dependence on renal dialysis: Secondary | ICD-10-CM | POA: Diagnosis not present

## 2019-08-25 DIAGNOSIS — N186 End stage renal disease: Secondary | ICD-10-CM | POA: Diagnosis not present

## 2019-08-25 DIAGNOSIS — D631 Anemia in chronic kidney disease: Secondary | ICD-10-CM | POA: Diagnosis not present

## 2019-08-27 DIAGNOSIS — N2581 Secondary hyperparathyroidism of renal origin: Secondary | ICD-10-CM | POA: Diagnosis not present

## 2019-08-27 DIAGNOSIS — N186 End stage renal disease: Secondary | ICD-10-CM | POA: Diagnosis not present

## 2019-08-27 DIAGNOSIS — D631 Anemia in chronic kidney disease: Secondary | ICD-10-CM | POA: Diagnosis not present

## 2019-08-27 DIAGNOSIS — A498 Other bacterial infections of unspecified site: Secondary | ICD-10-CM | POA: Diagnosis not present

## 2019-08-27 DIAGNOSIS — Z992 Dependence on renal dialysis: Secondary | ICD-10-CM | POA: Diagnosis not present

## 2019-08-30 DIAGNOSIS — I871 Compression of vein: Secondary | ICD-10-CM | POA: Diagnosis not present

## 2019-08-30 DIAGNOSIS — T8249XA Other complication of vascular dialysis catheter, initial encounter: Secondary | ICD-10-CM | POA: Diagnosis not present

## 2019-08-30 DIAGNOSIS — N186 End stage renal disease: Secondary | ICD-10-CM | POA: Diagnosis not present

## 2019-08-30 DIAGNOSIS — Z992 Dependence on renal dialysis: Secondary | ICD-10-CM | POA: Diagnosis not present

## 2019-08-31 DIAGNOSIS — N186 End stage renal disease: Secondary | ICD-10-CM | POA: Diagnosis not present

## 2019-08-31 DIAGNOSIS — Z992 Dependence on renal dialysis: Secondary | ICD-10-CM | POA: Diagnosis not present

## 2019-08-31 DIAGNOSIS — I129 Hypertensive chronic kidney disease with stage 1 through stage 4 chronic kidney disease, or unspecified chronic kidney disease: Secondary | ICD-10-CM | POA: Diagnosis not present

## 2019-09-01 DIAGNOSIS — N186 End stage renal disease: Secondary | ICD-10-CM | POA: Diagnosis not present

## 2019-09-01 DIAGNOSIS — D631 Anemia in chronic kidney disease: Secondary | ICD-10-CM | POA: Diagnosis not present

## 2019-09-01 DIAGNOSIS — Z992 Dependence on renal dialysis: Secondary | ICD-10-CM | POA: Diagnosis not present

## 2019-09-01 DIAGNOSIS — N2581 Secondary hyperparathyroidism of renal origin: Secondary | ICD-10-CM | POA: Diagnosis not present

## 2019-09-01 DIAGNOSIS — D689 Coagulation defect, unspecified: Secondary | ICD-10-CM | POA: Diagnosis not present

## 2019-09-03 DIAGNOSIS — D631 Anemia in chronic kidney disease: Secondary | ICD-10-CM | POA: Diagnosis not present

## 2019-09-03 DIAGNOSIS — Z992 Dependence on renal dialysis: Secondary | ICD-10-CM | POA: Diagnosis not present

## 2019-09-03 DIAGNOSIS — D689 Coagulation defect, unspecified: Secondary | ICD-10-CM | POA: Diagnosis not present

## 2019-09-03 DIAGNOSIS — N186 End stage renal disease: Secondary | ICD-10-CM | POA: Diagnosis not present

## 2019-09-03 DIAGNOSIS — N2581 Secondary hyperparathyroidism of renal origin: Secondary | ICD-10-CM | POA: Diagnosis not present

## 2019-09-06 DIAGNOSIS — N186 End stage renal disease: Secondary | ICD-10-CM | POA: Diagnosis not present

## 2019-09-06 DIAGNOSIS — N2581 Secondary hyperparathyroidism of renal origin: Secondary | ICD-10-CM | POA: Diagnosis not present

## 2019-09-06 DIAGNOSIS — D689 Coagulation defect, unspecified: Secondary | ICD-10-CM | POA: Diagnosis not present

## 2019-09-06 DIAGNOSIS — Z992 Dependence on renal dialysis: Secondary | ICD-10-CM | POA: Diagnosis not present

## 2019-09-06 DIAGNOSIS — D631 Anemia in chronic kidney disease: Secondary | ICD-10-CM | POA: Diagnosis not present

## 2019-09-08 DIAGNOSIS — Z992 Dependence on renal dialysis: Secondary | ICD-10-CM | POA: Diagnosis not present

## 2019-09-08 DIAGNOSIS — N2581 Secondary hyperparathyroidism of renal origin: Secondary | ICD-10-CM | POA: Diagnosis not present

## 2019-09-08 DIAGNOSIS — D689 Coagulation defect, unspecified: Secondary | ICD-10-CM | POA: Diagnosis not present

## 2019-09-08 DIAGNOSIS — D631 Anemia in chronic kidney disease: Secondary | ICD-10-CM | POA: Diagnosis not present

## 2019-09-08 DIAGNOSIS — N186 End stage renal disease: Secondary | ICD-10-CM | POA: Diagnosis not present

## 2019-09-10 DIAGNOSIS — D689 Coagulation defect, unspecified: Secondary | ICD-10-CM | POA: Diagnosis not present

## 2019-09-10 DIAGNOSIS — N186 End stage renal disease: Secondary | ICD-10-CM | POA: Diagnosis not present

## 2019-09-10 DIAGNOSIS — D631 Anemia in chronic kidney disease: Secondary | ICD-10-CM | POA: Diagnosis not present

## 2019-09-10 DIAGNOSIS — N2581 Secondary hyperparathyroidism of renal origin: Secondary | ICD-10-CM | POA: Diagnosis not present

## 2019-09-10 DIAGNOSIS — Z992 Dependence on renal dialysis: Secondary | ICD-10-CM | POA: Diagnosis not present

## 2019-09-13 DIAGNOSIS — N186 End stage renal disease: Secondary | ICD-10-CM | POA: Diagnosis not present

## 2019-09-13 DIAGNOSIS — Z992 Dependence on renal dialysis: Secondary | ICD-10-CM | POA: Diagnosis not present

## 2019-09-13 DIAGNOSIS — D631 Anemia in chronic kidney disease: Secondary | ICD-10-CM | POA: Diagnosis not present

## 2019-09-13 DIAGNOSIS — D689 Coagulation defect, unspecified: Secondary | ICD-10-CM | POA: Diagnosis not present

## 2019-09-13 DIAGNOSIS — N2581 Secondary hyperparathyroidism of renal origin: Secondary | ICD-10-CM | POA: Diagnosis not present

## 2019-09-14 ENCOUNTER — Telehealth: Payer: Self-pay | Admitting: Internal Medicine

## 2019-09-14 NOTE — Chronic Care Management (AMB) (Signed)
Chronic Care Management   Note  09/14/2019 Name: Whitney Walker MRN: [id number] DOB: 1956/05/05  Whitney Walker is a 63 y.o. year old female who is a primary care patient of Glendale Chard, MD. I reached out to [name] by phone today in response to a referral sent by [name]'s health plan.    [name] was given information about Chronic Care Management services today including:  1. CCM service includes personalized support from designated clinical staff supervised by her physician, including individualized plan of care and coordination with other care providers 2. 24/7 contact phone numbers for assistance for urgent and routine care needs. 3. Service will only be billed when office clinical staff spend 20 minutes or more in a month to coordinate care. 4. Only one practitioner may furnish and bill the service in a calendar month. 5. The patient may stop CCM services at any time (effective at the end of the month) by phone call to the office staff. 6. The patient will be responsible for cost sharing (co-pay) of up to 20% of the service fee (after annual deductible is met).  Patient agreed to services and verbal consent obtained.   Follow up plan: Telephone appointment with CCM team member scheduled for: 10/14/2019  [place]  ??[email or web address]   ??[phone]

## 2019-09-15 DIAGNOSIS — N186 End stage renal disease: Secondary | ICD-10-CM | POA: Diagnosis not present

## 2019-09-15 DIAGNOSIS — D631 Anemia in chronic kidney disease: Secondary | ICD-10-CM | POA: Diagnosis not present

## 2019-09-15 DIAGNOSIS — N2581 Secondary hyperparathyroidism of renal origin: Secondary | ICD-10-CM | POA: Diagnosis not present

## 2019-09-15 DIAGNOSIS — Z992 Dependence on renal dialysis: Secondary | ICD-10-CM | POA: Diagnosis not present

## 2019-09-15 DIAGNOSIS — D689 Coagulation defect, unspecified: Secondary | ICD-10-CM | POA: Diagnosis not present

## 2019-09-17 DIAGNOSIS — N186 End stage renal disease: Secondary | ICD-10-CM | POA: Diagnosis not present

## 2019-09-17 DIAGNOSIS — Z992 Dependence on renal dialysis: Secondary | ICD-10-CM | POA: Diagnosis not present

## 2019-09-17 DIAGNOSIS — N2581 Secondary hyperparathyroidism of renal origin: Secondary | ICD-10-CM | POA: Diagnosis not present

## 2019-09-17 DIAGNOSIS — D631 Anemia in chronic kidney disease: Secondary | ICD-10-CM | POA: Diagnosis not present

## 2019-09-17 DIAGNOSIS — D689 Coagulation defect, unspecified: Secondary | ICD-10-CM | POA: Diagnosis not present

## 2019-09-20 DIAGNOSIS — Z992 Dependence on renal dialysis: Secondary | ICD-10-CM | POA: Diagnosis not present

## 2019-09-20 DIAGNOSIS — N186 End stage renal disease: Secondary | ICD-10-CM | POA: Diagnosis not present

## 2019-09-20 DIAGNOSIS — D631 Anemia in chronic kidney disease: Secondary | ICD-10-CM | POA: Diagnosis not present

## 2019-09-20 DIAGNOSIS — D689 Coagulation defect, unspecified: Secondary | ICD-10-CM | POA: Diagnosis not present

## 2019-09-20 DIAGNOSIS — N2581 Secondary hyperparathyroidism of renal origin: Secondary | ICD-10-CM | POA: Diagnosis not present

## 2019-09-22 ENCOUNTER — Telehealth: Payer: Self-pay

## 2019-09-22 DIAGNOSIS — D689 Coagulation defect, unspecified: Secondary | ICD-10-CM | POA: Diagnosis not present

## 2019-09-22 DIAGNOSIS — N186 End stage renal disease: Secondary | ICD-10-CM | POA: Diagnosis not present

## 2019-09-22 DIAGNOSIS — Z992 Dependence on renal dialysis: Secondary | ICD-10-CM | POA: Diagnosis not present

## 2019-09-22 DIAGNOSIS — D631 Anemia in chronic kidney disease: Secondary | ICD-10-CM | POA: Diagnosis not present

## 2019-09-22 DIAGNOSIS — N2581 Secondary hyperparathyroidism of renal origin: Secondary | ICD-10-CM | POA: Diagnosis not present

## 2019-09-22 NOTE — Telephone Encounter (Signed)
Patient called stating she has been exposed to covid and would like to know what she needs to do.  I SCHEDULED HER A VIRTUAL APPOINTMENT FOR TOMORROW 09/22/19 AT 8:30am. Lonia Mad

## 2019-09-23 ENCOUNTER — Other Ambulatory Visit: Payer: Self-pay

## 2019-09-23 ENCOUNTER — Telehealth (INDEPENDENT_AMBULATORY_CARE_PROVIDER_SITE_OTHER): Payer: Medicare Other | Admitting: Nurse Practitioner

## 2019-09-23 ENCOUNTER — Encounter: Payer: Self-pay | Admitting: Nurse Practitioner

## 2019-09-23 ENCOUNTER — Telehealth: Payer: Self-pay

## 2019-09-23 VITALS — Temp 98.6°F | Ht 62.0 in | Wt 199.0 lb

## 2019-09-23 DIAGNOSIS — Z20822 Contact with and (suspected) exposure to covid-19: Secondary | ICD-10-CM

## 2019-09-23 DIAGNOSIS — Z20828 Contact with and (suspected) exposure to other viral communicable diseases: Secondary | ICD-10-CM

## 2019-09-23 DIAGNOSIS — Z992 Dependence on renal dialysis: Secondary | ICD-10-CM

## 2019-09-23 DIAGNOSIS — N186 End stage renal disease: Secondary | ICD-10-CM | POA: Diagnosis not present

## 2019-09-23 DIAGNOSIS — R6889 Other general symptoms and signs: Secondary | ICD-10-CM | POA: Diagnosis not present

## 2019-09-23 NOTE — Patient Instructions (Signed)
Coronavirus (COVID-19) Are you at risk?  Are you at risk for the Coronavirus (COVID-19)?  To be considered HIGH RISK for Coronavirus (COVID-19), you have to meet the following criteria:  . Traveled to [place], [place], [place], [place] or [place]; or in the [place] to [place], [place], [place], or [place]; and have fever, cough, and shortness of breath within the last 2 weeks of travel OR . Been in close contact with a person diagnosed with COVID-19 within the last 2 weeks and have fever, cough, and shortness of breath . IF YOU DO NOT MEET THESE CRITERIA, YOU ARE CONSIDERED LOW RISK FOR COVID-19.  What to do if you are HIGH RISK for COVID-19?  . If you are having a medical emergency, call 911. . Seek medical care right away. Before you go to a doctor's office, urgent care or emergency department, call ahead and tell them about your recent travel, contact with someone diagnosed with COVID-19, and your symptoms. You should receive instructions from your physician's office regarding next steps of care.  . When you arrive at healthcare provider, tell the healthcare staff immediately you have returned from visiting [place], [place], [place], [place] or [place]; or traveled in the [place] to [place], [place], [place], or [place]; in the last two weeks or you have been in close contact with a person diagnosed with COVID-19 in the last 2 weeks.   . Tell the health care staff about your symptoms: fever, cough and shortness of breath. . After you have been seen by a medical provider, you will be either: o Tested for (COVID-19) and discharged home on quarantine except to seek medical care if symptoms worsen, and asked to  - Stay home and avoid contact with others until you get your results (4-5 days)  - Avoid travel on public transportation if possible (such as bus, train, or airplane) or o Sent to the Emergency Department by EMS for evaluation, COVID-19 testing, and possible  admission depending on your condition and test results.  What to do if you are LOW RISK for COVID-19?  Reduce your risk of any infection by using the same precautions used for avoiding the common cold or flu:  . Wash your hands often with soap and warm water for at least 20 seconds.  If soap and water are not readily available, use an alcohol-based hand sanitizer with at least 60% alcohol.  . If coughing or sneezing, cover your mouth and nose by coughing or sneezing into the elbow areas of your shirt or coat, into a tissue or into your sleeve (not your hands). . Avoid shaking hands with others and consider head nods or verbal greetings only. . Avoid touching your eyes, nose, or mouth with unwashed hands.  . Avoid close contact with people who are sick. . Avoid places or events with large numbers of people in one location, like concerts or sporting events. . Carefully consider travel plans you have or are making. . If you are planning any travel outside or inside the [place], visit the CDC's Travelers' Health webpage for the latest health notices. . If you have some symptoms but not all symptoms, continue to monitor at home and seek medical attention if your symptoms worsen. . If you are having a medical emergency, call 911.   ADDITIONAL HEALTHCARE OPTIONS FOR PATIENTS  [facility] / e-Visit: [email or web address]         [facility]: [phone]  [facility]: [phone]                   [facility]: [phone]   

## 2019-09-23 NOTE — Progress Notes (Signed)
Virtual Visit via Video   This visit type was conducted due to national recommendations for restrictions regarding the COVID-19 Pandemic (e.g. social distancing) in an effort to limit this patient's exposure and mitigate transmission in our community.  Due to her co-morbid illnesses, this patient is at least at moderate risk for complications without adequate follow up.  This format is felt to be most appropriate for this patient at this time.  All issues noted in this document were discussed and addressed.  A limited physical exam was performed with this format.    This visit type was conducted due to national recommendations for restrictions regarding the COVID-19 Pandemic (e.g. social distancing) in an effort to limit this patient's exposure and mitigate transmission in our community.  Patients identity confirmed using two different identifiers.  This format is felt to be most appropriate for this patient at this time.  All issues noted in this document were discussed and addressed.  No physical exam was performed (except for noted visual exam findings with Video Visits).    Date:  09/27/2019   ID:  Whitney Walker, DOB [birth year], MRN [id number]  Patient Location:  Home - spoke with [name]  Provider location:   Office    Chief Complaint:  Exposure to coronavirus   History of Present Illness:    Whitney Walker is a 63 y.o. female who presents via video conferencing for a telehealth visit today.    The patient does not have symptoms concerning for COVID-19 infection (fever, chills, cough, or new shortness of breath).   HPI  She was exposed to her daughter who has tested positive for coronavirus. Her daughter is the driver for her to and from dialysis.  Denies any symptoms such as fever, chills or body aches.  Her dialysis days are M, W, F.  She was around her daughter on Monday and sitting on the porch Tuesday.  [facility] in [place].    Past Medical  History:  Diagnosis Date  . Anemia   . Anxiety   . CHF (congestive heart failure) ([place])   . Colon cancer [facility])    treatment surgery  . Complication of anesthesia    after first C- [place] "couldnt walk after", patient denies having a spinal  . COPD (chronic obstructive pulmonary disease) ([place])   . Coronary artery disease   . Depression   . DVT (deep venous thrombosis) ([place])   . ESRD (end stage renal disease) ([place])    Hemo: MWF  . History of blood transfusion 04/2018  . Hypertension    07/07/18- no longer takes BP medications  . Meningitis   . Pain in limb 07/30/2013  . PE (pulmonary embolism)   . Peripheral vascular disease ([place])   . Restless legs   . Shortness of breath    with exertion  . Sleep apnea   . Vertigo    Past Surgical History:  Procedure Laterality Date  . ABDOMINAL AORTAGRAM N/A 07/26/2013   Procedure: ABDOMINAL Maxcine Ham;  Surgeon: [name] Childersburg, MD;  Location: [facility] CATH LAB;  Service: Cardiovascular;  Laterality: N/A;  . ABDOMINAL HYSTERECTOMY    . AV FISTULA PLACEMENT Left 05/05/2018   Procedure: ARTERIOVENOUS (AV) FISTULA CREATION BRACHIOCEPHALIC;  Surgeon: Waynetta Sandy, MD;  Location: [place];  Service: Vascular;  Laterality: Left;  . AV FISTULA PLACEMENT Left 07/16/2018   Procedure: ARTERIOVENOUS FISTULA CREATION;  Surgeon: Waynetta Sandy, MD;  Location: [place];  Service: Vascular;  Laterality: Left;  .  BASCILIC VEIN TRANSPOSITION Left 09/03/2018   Procedure: BASILIC VEIN TRANSPOSITION SECOND STAGE;  Surgeon: Waynetta Sandy, MD;  Location: [place];  Service: Vascular;  Laterality: Left;  . BREAST BIOPSY Left   . CESAREAN SECTION     X 3 [id number]  . CHOLECYSTECTOMY    . CHOLECYSTECTOMY  1980/s  . COLECTOMY  2010  . DIVERTICULOSIS SURGERY-2002  2012  . FEMORAL-POPLITEAL BYPASS GRAFT Left 08/13/2013   Procedure: BYPASS GRAFT FEMORAL-POPLITEAL ARTERY WITH NON-REVERSED SAPHANEOUS VEIN; ULTRASOUND GUIDED;  Surgeon: Mal [name], MD;   Location: [place];  Service: Vascular;  Laterality: Left;  . INSERTION OF DIALYSIS CATHETER Right 04/30/2018   Procedure: INSERTION OF Right Internal Jugular DIALYSIS CATHETER;  Surgeon: [name], MD;  Location: [place];  Service: Vascular;  Laterality: Right;  . LOWER EXTREMITY ANGIOGRAM Left 07/26/2013   Procedure: LOWER EXTREMITY ANGIOGRAM;  Surgeon: [name] Rutherford, MD;  Location: [facility] CATH LAB;  Service: Cardiovascular;  Laterality: Left;     Current Meds  Medication Sig  . acetaminophen (TYLENOL) 500 MG tablet Take 1,000 mg by mouth 2 (two) times daily as needed for moderate pain or headache.  . albuterol (PROVENTIL HFA;VENTOLIN HFA) 108 (90 Base) MCG/ACT inhaler Inhale 2 puffs into the lungs every 6 (six) hours as needed for wheezing or shortness of breath.   [name] apixaban (ELIQUIS) 2.5 MG TABS tablet Take 1 tablet (2.5 mg total) by mouth 2 (two) times daily.  [name] b complex-vitamin c-folic acid (NEPHRO-VITE) 0.8 MG TABS tablet Take 1 tablet by mouth at bedtime.  . Biotin 10000 MCG TABS Take by mouth.  . carvedilol (COREG) 6.25 MG tablet Take 6.25 mg by mouth 2 (two) times daily with a meal. Only takes once a day on dialysis days (holds in morning on Monday Wednesday and Friday)  . ferric citrate (AURYXIA) 1 GM 210 MG(Fe) tablet Take 210-420 mg by mouth See admin instructions. Take 210-420 mg with each meal (based on size of meal) and 210 mg with each snack   . meclizine (ANTIVERT) 12.5 MG tablet Take 12.5 mg by mouth 3 (three) times daily as needed for dizziness.  . mometasone (ELOCON) 0.1 % cream Apply to affected area daily  . mupirocin ointment (BACTROBAN) 2 % Apply 1 application topically daily as needed (skin bumps).  . NYSTATIN powder Apply 1 Bottle topically 2 (two) times daily as needed (dryness).      Allergies:   Gadolinium derivatives, Iohexol, and Iodine   Social History   Tobacco Use  . Smoking status: Current Some Day Smoker    Packs/day: 0.00    Years: 40.00    Pack years:  0.00    Types: Cigarettes  . Smokeless tobacco: Former Systems developer  . Tobacco comment: has an occasional cigarette  Substance Use Topics  . Alcohol use: No    Alcohol/week: 0.0 standard drinks    Comment: she used to drink alcohol, quit in 2010   . Drug use: No     Family Hx: The patient's family history includes Bleeding Disorder in her sister; Cancer (age of onset: 28) in her cousin; Cancer (age of onset: 49) in her mother; Cancer (age of onset: 85) in her father; Hypertension in her daughter and sister. There is no history of Breast cancer.  ROS:   Please see the history of present illness.    ROS  Review of Systems - History obtained from the patient General ROS: negative for - fatigue, fever or malaise Psychological ROS: negative  for - anxiety Respiratory ROS: negative for - wheezing Cardiovascular ROS: negative for - chest pain Gastrointestinal ROS: negative for - appetite loss Neurological ROS: negative for - confusion, headaches, memory loss or weakness    All other systems reviewed and are negative.   Labs/Other Tests and Data Reviewed:    Recent Labs: 03/02/2019: ALT 18; B Natriuretic Peptide 1,671.0; Platelets 229 03/03/2019: Sodium 134 04/21/2019: BUN 62; Creatinine 10.7; Potassium 6.0 04/22/2019: Hemoglobin 33.3   Recent Lipid Panel Lab Results  Component Value Date/Time   CHOL 191 06/04/2018   TRIG 155 06/04/2018   HDL 63 06/04/2018   CHOLHDL 5.6 09/08/2009 04:03 AM   LDLCALC 97 06/04/2018    Wt Readings from Last 3 Encounters:  09/23/19 199 lb (90.3 kg)  07/20/19 194 lb (88 kg)  06/22/19 192 lb (87.1 kg)     Exam:    Vital Signs:  Temp 98.6 F (37 C) (Oral)   Ht 5\' 2"  (1.575 m)   Wt 199 lb (90.3 kg)   BMI 36.40 kg/m     Physical Exam  Constitutional: She is oriented to person, place, and time and well-developed, well-nourished, and in no distress.  Pulmonary/Chest: Effort normal.  Neurological: She is alert and oriented to person, place, and time.   Psychiatric: Mood, memory, affect and judgment normal.    ASSESSMENT & PLAN:    1. Exposure to 2019 Novel Coronavirus  Exposed to her daughter who is positive for coronavirus  She is to call transportation to take her to be tested  - Novel Coronavirus, NAA ([facility])  2. ESRD (end stage renal disease) ([place])  Called to [facility] to see the procedure for a patient exposed to [employer] with [facility] and advised patient will still be able to get her treatment they will separate her from the other patients.  3. Chronic kidney disease requiring chronic dialysis ([place])  Continue with going to dialysis at her regular time   COVID-19 Education: The signs and symptoms of COVID-19 were discussed with the patient and how to seek care for testing (follow up with PCP or arrange E-visit).  The importance of social distancing was discussed today.  Patient Risk:   After full review of this patients clinical status, I feel that they are at least moderate risk at this time.  Time:   Today, I have spent 15 minutes/ seconds with the patient with telehealth technology discussing above diagnoses.     Medication Adjustments/Labs and Tests Ordered: Current medicines are reviewed at length with the patient today.  Concerns regarding medicines are outlined above.   Tests Ordered: No orders of the defined types were placed in this encounter.   Medication Changes: No orders of the defined types were placed in this encounter.   Disposition:  Follow up prn  Signed, [name], FNP

## 2019-09-23 NOTE — Telephone Encounter (Signed)
Patient called to notify us she has done the covid testing. YRL,RMA

## 2019-09-24 DIAGNOSIS — N2581 Secondary hyperparathyroidism of renal origin: Secondary | ICD-10-CM | POA: Diagnosis not present

## 2019-09-24 DIAGNOSIS — D631 Anemia in chronic kidney disease: Secondary | ICD-10-CM | POA: Diagnosis not present

## 2019-09-24 DIAGNOSIS — N186 End stage renal disease: Secondary | ICD-10-CM | POA: Diagnosis not present

## 2019-09-24 DIAGNOSIS — D689 Coagulation defect, unspecified: Secondary | ICD-10-CM | POA: Diagnosis not present

## 2019-09-24 DIAGNOSIS — Z992 Dependence on renal dialysis: Secondary | ICD-10-CM | POA: Diagnosis not present

## 2019-09-25 LAB — NOVEL CORONAVIRUS, NAA: SARS-CoV-2, NAA: NOT DETECTED

## 2019-09-27 DIAGNOSIS — Z992 Dependence on renal dialysis: Secondary | ICD-10-CM | POA: Diagnosis not present

## 2019-09-27 DIAGNOSIS — N186 End stage renal disease: Secondary | ICD-10-CM | POA: Diagnosis not present

## 2019-09-27 DIAGNOSIS — D689 Coagulation defect, unspecified: Secondary | ICD-10-CM | POA: Diagnosis not present

## 2019-09-27 DIAGNOSIS — D631 Anemia in chronic kidney disease: Secondary | ICD-10-CM | POA: Diagnosis not present

## 2019-09-27 DIAGNOSIS — N2581 Secondary hyperparathyroidism of renal origin: Secondary | ICD-10-CM | POA: Diagnosis not present

## 2019-09-29 DIAGNOSIS — D631 Anemia in chronic kidney disease: Secondary | ICD-10-CM | POA: Diagnosis not present

## 2019-09-29 DIAGNOSIS — Z992 Dependence on renal dialysis: Secondary | ICD-10-CM | POA: Diagnosis not present

## 2019-09-29 DIAGNOSIS — N186 End stage renal disease: Secondary | ICD-10-CM | POA: Diagnosis not present

## 2019-09-29 DIAGNOSIS — D689 Coagulation defect, unspecified: Secondary | ICD-10-CM | POA: Diagnosis not present

## 2019-09-29 DIAGNOSIS — N2581 Secondary hyperparathyroidism of renal origin: Secondary | ICD-10-CM | POA: Diagnosis not present

## 2019-09-30 DIAGNOSIS — I129 Hypertensive chronic kidney disease with stage 1 through stage 4 chronic kidney disease, or unspecified chronic kidney disease: Secondary | ICD-10-CM | POA: Diagnosis not present

## 2019-09-30 DIAGNOSIS — N186 End stage renal disease: Secondary | ICD-10-CM | POA: Diagnosis not present

## 2019-09-30 DIAGNOSIS — Z992 Dependence on renal dialysis: Secondary | ICD-10-CM | POA: Diagnosis not present

## 2019-10-01 DIAGNOSIS — N186 End stage renal disease: Secondary | ICD-10-CM | POA: Diagnosis not present

## 2019-10-01 DIAGNOSIS — D631 Anemia in chronic kidney disease: Secondary | ICD-10-CM | POA: Diagnosis not present

## 2019-10-01 DIAGNOSIS — Z23 Encounter for immunization: Secondary | ICD-10-CM | POA: Diagnosis not present

## 2019-10-01 DIAGNOSIS — N2581 Secondary hyperparathyroidism of renal origin: Secondary | ICD-10-CM | POA: Diagnosis not present

## 2019-10-01 DIAGNOSIS — Z992 Dependence on renal dialysis: Secondary | ICD-10-CM | POA: Diagnosis not present

## 2019-10-04 DIAGNOSIS — Z23 Encounter for immunization: Secondary | ICD-10-CM | POA: Diagnosis not present

## 2019-10-04 DIAGNOSIS — D631 Anemia in chronic kidney disease: Secondary | ICD-10-CM | POA: Diagnosis not present

## 2019-10-04 DIAGNOSIS — N186 End stage renal disease: Secondary | ICD-10-CM | POA: Diagnosis not present

## 2019-10-04 DIAGNOSIS — Z992 Dependence on renal dialysis: Secondary | ICD-10-CM | POA: Diagnosis not present

## 2019-10-04 DIAGNOSIS — N2581 Secondary hyperparathyroidism of renal origin: Secondary | ICD-10-CM | POA: Diagnosis not present

## 2019-10-06 DIAGNOSIS — D631 Anemia in chronic kidney disease: Secondary | ICD-10-CM | POA: Diagnosis not present

## 2019-10-06 DIAGNOSIS — Z992 Dependence on renal dialysis: Secondary | ICD-10-CM | POA: Diagnosis not present

## 2019-10-06 DIAGNOSIS — N2581 Secondary hyperparathyroidism of renal origin: Secondary | ICD-10-CM | POA: Diagnosis not present

## 2019-10-06 DIAGNOSIS — Z23 Encounter for immunization: Secondary | ICD-10-CM | POA: Diagnosis not present

## 2019-10-06 DIAGNOSIS — N186 End stage renal disease: Secondary | ICD-10-CM | POA: Diagnosis not present

## 2019-10-08 DIAGNOSIS — D631 Anemia in chronic kidney disease: Secondary | ICD-10-CM | POA: Diagnosis not present

## 2019-10-08 DIAGNOSIS — N2581 Secondary hyperparathyroidism of renal origin: Secondary | ICD-10-CM | POA: Diagnosis not present

## 2019-10-08 DIAGNOSIS — Z992 Dependence on renal dialysis: Secondary | ICD-10-CM | POA: Diagnosis not present

## 2019-10-08 DIAGNOSIS — Z23 Encounter for immunization: Secondary | ICD-10-CM | POA: Diagnosis not present

## 2019-10-08 DIAGNOSIS — N186 End stage renal disease: Secondary | ICD-10-CM | POA: Diagnosis not present

## 2019-10-13 DIAGNOSIS — N2581 Secondary hyperparathyroidism of renal origin: Secondary | ICD-10-CM | POA: Diagnosis not present

## 2019-10-13 DIAGNOSIS — D631 Anemia in chronic kidney disease: Secondary | ICD-10-CM | POA: Diagnosis not present

## 2019-10-13 DIAGNOSIS — N186 End stage renal disease: Secondary | ICD-10-CM | POA: Diagnosis not present

## 2019-10-13 DIAGNOSIS — Z23 Encounter for immunization: Secondary | ICD-10-CM | POA: Diagnosis not present

## 2019-10-13 DIAGNOSIS — Z992 Dependence on renal dialysis: Secondary | ICD-10-CM | POA: Diagnosis not present

## 2019-10-14 ENCOUNTER — Other Ambulatory Visit: Payer: Self-pay | Admitting: Hematology

## 2019-10-14 ENCOUNTER — Telehealth: Payer: Medicare Other

## 2019-10-14 ENCOUNTER — Ambulatory Visit (INDEPENDENT_AMBULATORY_CARE_PROVIDER_SITE_OTHER): Payer: Medicare Other

## 2019-10-14 DIAGNOSIS — I10 Essential (primary) hypertension: Secondary | ICD-10-CM | POA: Diagnosis not present

## 2019-10-14 DIAGNOSIS — I5032 Chronic diastolic (congestive) heart failure: Secondary | ICD-10-CM

## 2019-10-14 DIAGNOSIS — N186 End stage renal disease: Secondary | ICD-10-CM | POA: Diagnosis not present

## 2019-10-14 DIAGNOSIS — Z1231 Encounter for screening mammogram for malignant neoplasm of breast: Secondary | ICD-10-CM

## 2019-10-15 DIAGNOSIS — Z23 Encounter for immunization: Secondary | ICD-10-CM | POA: Diagnosis not present

## 2019-10-15 DIAGNOSIS — D631 Anemia in chronic kidney disease: Secondary | ICD-10-CM | POA: Diagnosis not present

## 2019-10-15 DIAGNOSIS — Z992 Dependence on renal dialysis: Secondary | ICD-10-CM | POA: Diagnosis not present

## 2019-10-15 DIAGNOSIS — N2581 Secondary hyperparathyroidism of renal origin: Secondary | ICD-10-CM | POA: Diagnosis not present

## 2019-10-15 DIAGNOSIS — N186 End stage renal disease: Secondary | ICD-10-CM | POA: Diagnosis not present

## 2019-10-18 DIAGNOSIS — N186 End stage renal disease: Secondary | ICD-10-CM | POA: Diagnosis not present

## 2019-10-18 DIAGNOSIS — Z992 Dependence on renal dialysis: Secondary | ICD-10-CM | POA: Diagnosis not present

## 2019-10-18 DIAGNOSIS — D631 Anemia in chronic kidney disease: Secondary | ICD-10-CM | POA: Diagnosis not present

## 2019-10-18 DIAGNOSIS — Z23 Encounter for immunization: Secondary | ICD-10-CM | POA: Diagnosis not present

## 2019-10-18 DIAGNOSIS — N2581 Secondary hyperparathyroidism of renal origin: Secondary | ICD-10-CM | POA: Diagnosis not present

## 2019-10-18 NOTE — Chronic Care Management (AMB) (Signed)
Chronic Care Management   Initial Visit Note  10/14/2019 Name: Whitney Walker MRN: [id number] DOB: 08/14/1956  Referred by: Glendale Chard, MD Reason for referral : Chronic Care Management (INITIAL CCM RNCM Telephone Outreach)   Whitney Walker is a 63 y.o. year old female who is a primary care patient of Glendale Chard, MD. The CCM team was consulted for assistance with chronic disease management and care coordination needs related to ESRD and Pulmonary Disease  Review of patient status, including review of consultants reports, relevant laboratory and other test results, and collaboration with appropriate care team members and the patient's provider was performed as part of comprehensive patient evaluation and provision of chronic care management services.    SDOH (Social Determinants of Health) screening performed today: None. See Care Plan for related entries.   Advanced Directives Status: N See Care Plan and Vynca application for related entries.   I spoke with Whitney Walker by telephone today to assess for CCM RN CM needs and her care plan was established.   Medications: Outpatient Encounter Medications as of 10/14/2019  Medication Sig Note  . acetaminophen (TYLENOL) 500 MG tablet Take 1,000 mg by mouth 2 (two) times daily as needed for moderate pain or headache.   . albuterol (PROVENTIL HFA;VENTOLIN HFA) 108 (90 Base) MCG/ACT inhaler Inhale 2 puffs into the lungs every 6 (six) hours as needed for wheezing or shortness of breath.    [name] apixaban (ELIQUIS) 2.5 MG TABS tablet Take 1 tablet (2.5 mg total) by mouth 2 (two) times daily.   . Biotin 10000 MCG TABS Take by mouth.   . calcium citrate (CALCITRATE - DOSED IN MG ELEMENTAL CALCIUM) 950 MG tablet Take 200 mg of elemental calcium by mouth 3 (three) times daily. 2 tabs with dialysis on M,W,F   . carvedilol (COREG) 6.25 MG tablet Take 6.25 mg by mouth 2 (two) times daily with a meal. Only takes once a day on dialysis days (holds in  morning on Monday Wednesday and Friday)   . meclizine (ANTIVERT) 12.5 MG tablet Take 12.5 mg by mouth 3 (three) times daily as needed for dizziness.   . mometasone (ELOCON) 0.1 % cream Apply to affected area daily   . multivitamin (RENA-VIT) TABS tablet Take 1 tablet by mouth at bedtime.   . mupirocin ointment (BACTROBAN) 2 % Apply 1 application topically daily as needed (skin bumps).   . Nutritional Supplements (FEEDING SUPPLEMENT, NEPRO CARB STEADY,) LIQD Take 237 mLs by mouth 3 (three) times a week. During dialysis, Monday, Wednesday & Friday   . NYSTATIN powder Apply 1 Bottle topically 2 (two) times daily as needed (dryness).    . promethazine (PHENERGAN) 12.5 MG tablet Take 12.5 mg by mouth every 6 (six) hours as needed for nausea or vomiting.   . [DISCONTINUED] b complex-vitamin c-folic acid (NEPHRO-VITE) 0.8 MG TABS tablet Take 1 tablet by mouth at bedtime.   . [DISCONTINUED] ferric citrate (AURYXIA) 1 GM 210 MG(Fe) tablet Take 210-420 mg by mouth See admin instructions. Take 210-420 mg with each meal (based on size of meal) and 210 mg with each snack  03/02/2019: [facility] 02/18/2019 30DS   No facility-administered encounter medications on file as of 10/14/2019.      Objective:  Lab Results  Component Value Date   HGBA1C 5.1 06/04/2018   HGBA1C (H) 09/07/2009    6.5 (NOTE) The ADA recommends the following therapeutic goal for glycemic control related to Hgb A1c measurement: Goal of therapy: <6.5 Hgb A1c  Reference: American Diabetes Association: Clinical Practice Recommendations 2010, Diabetes Care, 2010, 33: (Suppl  1).   Lab Results  Component Value Date   LDLCALC 97 06/04/2018   CREATININE 10.7 (A) 04/21/2019   BP Readings from Last 3 Encounters:  07/20/19 102/64  06/22/19 115/75  06/10/19 130/78    Goals Addressed      Patient Stated   . "I am not really wearing my CPAP" (pt-stated)       Current Barriers:  [name] Knowledge Deficits related to disease process and  Self Health Management for OSA  . Non-adherence to wearing CPAP as directed . Improper CPAP Face Mask  Nurse Case Manager Clinical Goal(s):  [name] Over the next 60 days, patient will work with the CCM team  to address needs related to Nasal Pillows for use of CPAP  CCM RN CM Interventions:  10/14/19 call completed with patient   . Evaluation of current treatment plan related to OSA and patient's adherence to plan as established by provider. . Provided education to patient re: the importance of wearing the CPAP machine every night and during naps exactly as directed; discussed potential complications that may occur from untreated OSA including Cardiovascular problems, Endocrine problems such as Diabetes, daytime sleepiness; discussed potential barriers for why patient is not wearing her CPAP; discussed patient admits her face mask is uncomfortable and does not fit properly; discussed patient is willing to try the Nasal Pillow . Collaborated with PCP provider [name], FNP regarding new order for Nasal Pillow to be faxed to DME supplier, [employer] - reply received confirming the order will be sent . Discussed plans with patient for ongoing care management follow up and provided patient with direct contact information for care management team . Provided patient with printed  educational materials related to OSA to be mailed to patient's home address  Patient Self Care Activities:  . Self administers medications as prescribed . Attends all scheduled provider appointments . Calls pharmacy for medication refills . Attends church or other social activities . Performs ADL's independently . Performs IADL's independently . Calls provider office for new concerns or questions  Initial goal documentation     . "I have been stumbling more frequently" (pt-stated)       Current Barriers:  [name] Knowledge Deficits related to diagnosis and treatment for poor gait/balance  Nurse Case Manager Clinical  Goal(s):  [name] Over the next 30 days, patient will work with PCP to address needs related to evaluation and treatment of patient reported episodes of "stumbling" and impaired gait disturbance  CCM RN CM Interventions:  10/14/19 call completed with patient   . Evaluation of current treatment plan related to impaired gait/mobility and stumbling and patient's adherence to plan as established by provider. . Advised patient to discuss her symptoms with her PCP; discussed patient was prescribed daytime Oxygen years ago but since relocating to [place] she has not been evaluated and is not wearing daytime Oxygen; Assessed for falls; patient denies but reports having several near falls; Assessed for use of DME; pt is using her walker; patient encouraged to use her walker at all times, always wear good supportive shoes and or skid proof socks, avoid walking on uneven ground and keeping floors clutter free and hallways well lit . Collaborated with PCP provider [name], FNP  regarding patient's reported use of past daytime Oxygen use and need for evaluation; advised patient reports near falls due to "stumbling" and losing her balance when this occurs- reply received from  provider, the office will call patient to schedule an OV for further evaluation and treatment of these symptoms  . Discussed plans with patient for ongoing care management follow up and provided patient with direct contact information for care management team  Patient Self Care Activities:  . Self administers medications as prescribed . Attends all scheduled provider appointments . Calls pharmacy for medication refills . Attends church or other social activities . Performs ADL's independently . Performs IADL's independently . Calls provider office for new concerns or questions  Initial goal documentation     . "My dialysis treatments are not going very good" (pt-stated)       Current Barriers:  [name] Knowledge Deficits related to ESRD  Nurse  Case Manager Clinical Goal(s):  [name] Over the next 90 days, patient will work with the [place] to address needs related to ESRD for disease education related to hemodialysis treatment, fluid balance, dietary concerns and hemodialysis access  CCM RN CM Interventions:  10/14/19 call completed with patient  . Evaluation of current treatment plan related to ESRD and Hemodialysis and patient's adherence to plan as established by provider. . Provided education to patient re: the benefits of receiving hemodialysis via Arteriovenous Fistula verses a PermaCath; Educated patient about Peritoneal dialysis and encouraged patient to discuss this further with her Nephrologist; discussed patient has an AVF that is underdeveloped, she's had multiple revisions; discussed her Charolette Forward Cath is positional and malfunctions during most treatments . Discussed plans with patient for ongoing care management follow up and provided patient with direct contact information for care management team  Patient Self Care Activities:  . Self administers medications as prescribed . Attends all scheduled provider appointments . Calls pharmacy for medication refills . Attends church or other social activities . Performs ADL's independently . Performs IADL's independently . Calls provider office for new concerns or questions  Initial goal documentation        Plan:   Telephone follow up appointment with care management team member scheduled for: 11/09/19  [name], RN, BSN, CCM Care Management Coordinator [place] [facility]  Direct Phone: [phone]

## 2019-10-18 NOTE — Patient Instructions (Signed)
Visit Information  Goals Addressed      Patient Stated   . "I am not really wearing my CPAP" (pt-stated)       Current Barriers:  [name] Knowledge Deficits related to disease process and Self Health Management for OSA  . Non-adherence to wearing CPAP as directed . Improper CPAP Face Mask  Nurse Case Manager Clinical Goal(s):  [name] Over the next 60 days, patient will work with the CCM team  to address needs related to Nasal Pillows for use of CPAP  CCM RN CM Interventions:  10/14/19 call completed with patient   . Evaluation of current treatment plan related to OSA and patient's adherence to plan as established by provider. . Provided education to patient re: the importance of wearing the CPAP machine every night and during naps exactly as directed; discussed potential complications that may occur from untreated OSA including Cardiovascular problems, Endocrine problems such as Diabetes, daytime sleepiness; discussed potential barriers for why patient is not wearing her CPAP; discussed patient admits her face mask is uncomfortable and does not fit properly; discussed patient is willing to try the Nasal Pillow . Collaborated with PCP provider [name], FNP regarding new order for Nasal Pillow to be faxed to DME supplier, [employer] - reply received confirming the order will be sent . Discussed plans with patient for ongoing care management follow up and provided patient with direct contact information for care management team . Provided patient with printed  educational materials related to OSA to be mailed to patient's home address  Patient Self Care Activities:  . Self administers medications as prescribed . Attends all scheduled provider appointments . Calls pharmacy for medication refills . Attends church or other social activities . Performs ADL's independently . Performs IADL's independently . Calls provider office for new concerns or questions  Initial goal documentation     . "I  have been stumbling more frequently" (pt-stated)       Current Barriers:  [name] Knowledge Deficits related to diagnosis and treatment for poor gait/balance  Nurse Case Manager Clinical Goal(s):  [name] Over the next 30 days, patient will work with PCP to address needs related to evaluation and treatment of patient reported episodes of "stumbling" and impaired gait disturbance  CCM RN CM Interventions:  10/14/19 call completed with patient   . Evaluation of current treatment plan related to impaired gait/mobility and stumbling and patient's adherence to plan as established by provider. . Advised patient to discuss her symptoms with her PCP; discussed patient was prescribed daytime Oxygen years ago but since relocating to [place] she has not been evaluated and is not wearing daytime Oxygen; Assessed for falls; patient denies but reports having several near falls; Assessed for use of DME; pt is using her walker; patient encouraged to use her walker at all times, always wear good supportive shoes and or skid proof socks, avoid walking on uneven ground and keeping floors clutter free and hallways well lit . Collaborated with PCP provider [name], FNP  regarding patient's reported use of past daytime Oxygen use and need for evaluation; advised patient reports near falls due to "stumbling" and losing her balance when this occurs- reply received from provider, the office will call patient to schedule an OV for further evaluation and treatment of these symptoms  . Discussed plans with patient for ongoing care management follow up and provided patient with direct contact information for care management team  Patient Self Care Activities:  . Self administers medications as  prescribed . Attends all scheduled provider appointments . Calls pharmacy for medication refills . Attends church or other social activities . Performs ADL's independently . Performs IADL's independently . Calls provider office for new concerns  or questions  Initial goal documentation     . "My dialysis treatments are not going very good" (pt-stated)       Current Barriers:  [name] Knowledge Deficits related to ESRD  Nurse Case Manager Clinical Goal(s):  [name] Over the next 90 days, patient will work with the [place] to address needs related to ESRD for disease education related to hemodialysis treatment, fluid balance, dietary concerns and hemodialysis access  CCM RN CM Interventions:  10/14/19 call completed with patient  . Evaluation of current treatment plan related to ESRD and Hemodialysis and patient's adherence to plan as established by provider. . Provided education to patient re: the benefits of receiving hemodialysis via Arteriovenous Fistula verses a PermaCath; Educated patient about Peritoneal dialysis and encouraged patient to discuss this further with her Nephrologist; discussed patient has an AVF that is underdeveloped, she's had multiple revisions; discussed her [place] Forward Cath is positional and malfunctions during most treatments . Discussed plans with patient for ongoing care management follow up and provided patient with direct contact information for care management team  Patient Self Care Activities:  . Self administers medications as prescribed . Attends all scheduled provider appointments . Calls pharmacy for medication refills . Attends church or other social activities . Performs ADL's independently . Performs IADL's independently . Calls provider office for new concerns or questions  Initial goal documentation        The patient verbalized understanding of instructions provided today and declined a print copy of patient instruction materials.   Telephone follow up appointment with care management team member scheduled for: 11/09/19  [name], RN, BSN, CCM Care Management Coordinator [facility]  Direct Phone: [phone]

## 2019-10-20 ENCOUNTER — Other Ambulatory Visit: Payer: Self-pay | Admitting: Hematology

## 2019-10-20 DIAGNOSIS — N2581 Secondary hyperparathyroidism of renal origin: Secondary | ICD-10-CM | POA: Diagnosis not present

## 2019-10-20 DIAGNOSIS — Z992 Dependence on renal dialysis: Secondary | ICD-10-CM | POA: Diagnosis not present

## 2019-10-20 DIAGNOSIS — N186 End stage renal disease: Secondary | ICD-10-CM | POA: Diagnosis not present

## 2019-10-20 DIAGNOSIS — I82412 Acute embolism and thrombosis of left femoral vein: Secondary | ICD-10-CM

## 2019-10-20 DIAGNOSIS — D631 Anemia in chronic kidney disease: Secondary | ICD-10-CM | POA: Diagnosis not present

## 2019-10-20 DIAGNOSIS — Z23 Encounter for immunization: Secondary | ICD-10-CM | POA: Diagnosis not present

## 2019-10-22 ENCOUNTER — Ambulatory Visit: Payer: Medicare Other

## 2019-10-22 DIAGNOSIS — Z992 Dependence on renal dialysis: Secondary | ICD-10-CM | POA: Diagnosis not present

## 2019-10-22 DIAGNOSIS — N186 End stage renal disease: Secondary | ICD-10-CM | POA: Diagnosis not present

## 2019-10-22 DIAGNOSIS — I5032 Chronic diastolic (congestive) heart failure: Secondary | ICD-10-CM

## 2019-10-22 DIAGNOSIS — Z23 Encounter for immunization: Secondary | ICD-10-CM | POA: Diagnosis not present

## 2019-10-22 DIAGNOSIS — D631 Anemia in chronic kidney disease: Secondary | ICD-10-CM | POA: Diagnosis not present

## 2019-10-22 DIAGNOSIS — N2581 Secondary hyperparathyroidism of renal origin: Secondary | ICD-10-CM | POA: Diagnosis not present

## 2019-10-22 NOTE — Patient Instructions (Signed)
Social Worker Visit Information  Goals we discussed today:  Goals Addressed            This Visit's Progress     Patient Stated   . "I am interested in learning more about life alert options" (pt-stated)       Current Barriers:  . Limited knowledge of health plan benefits   Social Work Clinical Goal(s):  [name] Over the next 45 days the patient will work with SW to become more knowledgeable of health plan benefits related to emergency response systems  CCM SW Interventions: . Patient interviewed and appropriate assessments performed . Determined the patient is interested in learning more about emergency response systems to wear around her wrist. The patient is not interested in an alert to be work around the neck . Discussed SW plan to follow up on health plan benefits in relation to desired type of alert . Scheduled follow up call to the patient over the next two weeks to discuss findings  Patient Self Care Activities:  . Self administers medications as prescribed . Calls pharmacy for medication refills . Calls provider office for new concerns or questions  Initial goal documentation         Follow Up Plan: SW will follow up with patient by phone over the next two weeks   [name], BSW, CDP Social Worker, Certified Dementia Practitioner [place] [facility] [phone]

## 2019-10-22 NOTE — Chronic Care Management (AMB) (Signed)
Chronic Care Management   Social Work General Note  10/22/2019 Name: Telina Kleckley MRN: [id number] DOB: [birth year]  Ryenn Howeth is a 63 y.o. year old female who is a primary care patient of Glendale Chard, MD. The CCM was consulted to assist the patient with care coordination.   Review of patient status, including review of consultants reports, relevant laboratory and other test results, and collaboration with appropriate care team members and the patient's provider was performed as part of comprehensive patient evaluation and provision of chronic care management services.    SW placed an initial outbound call to the patient to perform a SW screen. Patient identifies interest in learning more about personal emergency response systems.   SDOH (Social Determinants of Health) screening performed today. See Care Plan Entry related to challenges with: None The patient identifies concern over transportation but endorses use of SCAT, family members, and cabs for mode of transportation.  Advanced Directives Status: Patient denies interest in completion at this time. SW provided education on ability to assist if the patient were to change her mind.  Outpatient Encounter Medications as of 10/22/2019  Medication Sig  . acetaminophen (TYLENOL) 500 MG tablet Take 1,000 mg by mouth 2 (two) times daily as needed for moderate pain or headache.  . albuterol (PROVENTIL HFA;VENTOLIN HFA) 108 (90 Base) MCG/ACT inhaler Inhale 2 puffs into the lungs every 6 (six) hours as needed for wheezing or shortness of breath.   . Biotin 10000 MCG TABS Take by mouth.  . calcium citrate (CALCITRATE - DOSED IN MG ELEMENTAL CALCIUM) 950 MG tablet Take 200 mg of elemental calcium by mouth 3 (three) times daily. 2 tabs with dialysis on M,W,F  . carvedilol (COREG) 6.25 MG tablet Take 6.25 mg by mouth 2 (two) times daily with a meal. Only takes once a day on dialysis days (holds in morning on Monday Wednesday and Friday)  .  ELIQUIS 2.5 MG TABS tablet TAKE 1 TABLET BY MOUTH TWICE A DAY  . meclizine (ANTIVERT) 12.5 MG tablet Take 12.5 mg by mouth 3 (three) times daily as needed for dizziness.  . mometasone (ELOCON) 0.1 % cream Apply to affected area daily  . multivitamin (RENA-VIT) TABS tablet Take 1 tablet by mouth at bedtime.  . mupirocin ointment (BACTROBAN) 2 % Apply 1 application topically daily as needed (skin bumps).  . Nutritional Supplements (FEEDING SUPPLEMENT, NEPRO CARB STEADY,) LIQD Take 237 mLs by mouth 3 (three) times a week. During dialysis, Monday, Wednesday & Friday  . NYSTATIN powder Apply 1 Bottle topically 2 (two) times daily as needed (dryness).   . promethazine (PHENERGAN) 12.5 MG tablet Take 12.5 mg by mouth every 6 (six) hours as needed for nausea or vomiting.   No facility-administered encounter medications on file as of 10/22/2019.     Goals Addressed            This Visit's Progress     Patient Stated   . "I am interested in learning more about life alert options" (pt-stated)       Current Barriers:  . Limited knowledge of health plan benefits   Social Work Clinical Goal(s):  [name] Over the next 45 days the patient will work with SW to become more knowledgeable of health plan benefits related to emergency response systems  CCM SW Interventions: . Patient interviewed and appropriate assessments performed . Determined the patient is interested in learning more about emergency response systems to wear around her wrist. The patient is not interested in an  alert to be work around the neck . Discussed SW plan to follow up on health plan benefits in relation to desired type of alert . Scheduled follow up call to the patient over the next two weeks to discuss findings  Patient Self Care Activities:  . Self administers medications as prescribed . Calls pharmacy for medication refills . Calls provider office for new concerns or questions  Initial goal documentation         Follow  Up Plan: SW will follow up with patient by phone over the next two weeks       [name], BSW, CDP Social Worker, Certified Dementia Practitioner [place] [facility] [place] [facility] [phone]  Total time spent performing care coordination and/or care management activities with the patient by phone or face to face = 15 minutes.

## 2019-10-25 DIAGNOSIS — N2581 Secondary hyperparathyroidism of renal origin: Secondary | ICD-10-CM | POA: Diagnosis not present

## 2019-10-25 DIAGNOSIS — D631 Anemia in chronic kidney disease: Secondary | ICD-10-CM | POA: Diagnosis not present

## 2019-10-25 DIAGNOSIS — Z23 Encounter for immunization: Secondary | ICD-10-CM | POA: Diagnosis not present

## 2019-10-25 DIAGNOSIS — N186 End stage renal disease: Secondary | ICD-10-CM | POA: Diagnosis not present

## 2019-10-25 DIAGNOSIS — Z992 Dependence on renal dialysis: Secondary | ICD-10-CM | POA: Diagnosis not present

## 2019-10-26 ENCOUNTER — Other Ambulatory Visit: Payer: Self-pay

## 2019-10-26 ENCOUNTER — Ambulatory Visit (INDEPENDENT_AMBULATORY_CARE_PROVIDER_SITE_OTHER): Payer: Medicare Other | Admitting: Nurse Practitioner

## 2019-10-26 ENCOUNTER — Encounter: Payer: Self-pay | Admitting: Nurse Practitioner

## 2019-10-26 VITALS — BP 140/90 | HR 80 | Temp 97.6°F | Ht 62.2 in | Wt 199.4 lb

## 2019-10-26 DIAGNOSIS — R0600 Dyspnea, unspecified: Secondary | ICD-10-CM

## 2019-10-26 DIAGNOSIS — R2689 Other abnormalities of gait and mobility: Secondary | ICD-10-CM

## 2019-10-26 DIAGNOSIS — R0609 Other forms of dyspnea: Secondary | ICD-10-CM

## 2019-10-26 DIAGNOSIS — R21 Rash and other nonspecific skin eruption: Secondary | ICD-10-CM | POA: Diagnosis not present

## 2019-10-26 DIAGNOSIS — Z9181 History of falling: Secondary | ICD-10-CM | POA: Diagnosis not present

## 2019-10-26 MED ORDER — NYSTATIN 100000 UNIT/GM EX POWD: Freq: Four times a day (QID) | CUTANEOUS | 0 refills | Status: DC

## 2019-10-26 NOTE — Patient Instructions (Signed)
Patient refuses to have oxygen therapy at home due to likely having a concentrator

## 2019-10-26 NOTE — Progress Notes (Signed)
Subjective:     Patient ID: Whitney Walker , female    DOB: 03-Oct-1956 , 63 y.o.   MRN: [id number]   Chief Complaint  Patient presents with  . Respiratory Distress    HPI  She had been using oxygen 4 years ago and when she moved she sent it back, she had been sent home with it after being hospitalized.  She had been speaking with Whitney Walker and the discussion about oxygen. She will have shortness of breath when walking at times.  She does have a pulmonologist in the past.    She has recently started back wearing a CPAP and awaiting nasal prongs.    The patient is also concerned about her equilibrium being off when getting up from a sitting position.  She is interested in physical therapy.  Denies any falls in the last 6 months.  She has had several near falls. She feels like she is stumbling over her feet.       Past Medical History:  Diagnosis Date  . Anemia   . Anxiety   . CHF (congestive heart failure) ([place])   . Colon cancer [facility])    treatment surgery  . Complication of anesthesia    after first C- [place] "couldnt walk after", patient denies having a spinal  . COPD (chronic obstructive pulmonary disease) ([place])   . Coronary artery disease   . Depression   . DVT (deep venous thrombosis) ([place])   . ESRD (end stage renal disease) ([place])    Hemo: MWF  . History of blood transfusion 04/2018  . Hypertension    07/07/18- no longer takes BP medications  . Meningitis   . Pain in limb 07/30/2013  . PE (pulmonary embolism)   . Peripheral vascular disease ([place])   . Restless legs   . Shortness of breath    with exertion  . Sleep apnea   . Vertigo      Family History  Problem Relation Age of Onset  . Cancer Mother 73       breast and bone  . Cancer Father 30       prostate  . Hypertension Sister   . Bleeding Disorder Sister   . Cancer Cousin 20       breast cancer   . Hypertension Daughter   . Breast cancer Neg Hx      Current Outpatient Medications:  .  acetaminophen  (TYLENOL) 500 MG tablet, Take 1,000 mg by mouth 2 (two) times daily as needed for moderate pain or headache., Disp: , Rfl:  .  albuterol (PROVENTIL HFA;VENTOLIN HFA) 108 (90 Base) MCG/ACT inhaler, Inhale 2 puffs into the lungs every 6 (six) hours as needed for wheezing or shortness of breath. , Disp: , Rfl:  .  Biotin 10000 MCG TABS, Take by mouth., Disp: , Rfl:  .  calcium citrate (CALCITRATE - DOSED IN MG ELEMENTAL CALCIUM) 950 MG tablet, Take 200 mg of elemental calcium by mouth 3 (three) times daily. 2 tabs with dialysis on M,W,F, Disp: , Rfl:  .  carvedilol (COREG) 6.25 MG tablet, Take 6.25 mg by mouth 2 (two) times daily with a meal. Only takes once a day on dialysis days (holds in morning on Monday Wednesday and Friday), Disp: , Rfl:  .  ELIQUIS 2.5 MG TABS tablet, TAKE 1 TABLET BY MOUTH TWICE A DAY, Disp: 60 tablet, Rfl: 1 .  meclizine (ANTIVERT) 12.5 MG tablet, Take 12.5 mg by mouth 3 (three) times daily as needed for  dizziness., Disp: , Rfl:  .  mometasone (ELOCON) 0.1 % cream, Apply to affected area daily, Disp: 45 g, Rfl: 1 .  multivitamin (RENA-VIT) TABS tablet, Take 1 tablet by mouth at bedtime., Disp: , Rfl:  .  mupirocin ointment (BACTROBAN) 2 %, Apply 1 application topically daily as needed (skin bumps)., Disp: 22 g, Rfl: 2 .  Nutritional Supplements (FEEDING SUPPLEMENT, NEPRO CARB STEADY,) LIQD, Take 237 mLs by mouth 3 (three) times a week. During dialysis, Monday, Wednesday & Friday, Disp: , Rfl:  .  NYSTATIN powder, Apply 1 Bottle topically 2 (two) times daily as needed (dryness). , Disp: , Rfl: 2 .  promethazine (PHENERGAN) 12.5 MG tablet, Take 12.5 mg by mouth every 6 (six) hours as needed for nausea or vomiting., Disp: , Rfl:    Allergies  Allergen Reactions  . Gadolinium Derivatives Hives and Other (See Comments)    HIVES, Desc: HIVES W/ "DYE" USED FOR 1ST CT SCAN BUT NOT 2ND, NO PREMEDS USED, PT UNCERTAIN OF CIRCUMSTANCES,,?POSSIBLE MRI CONTRAST ALLERGY, ALL STUDIES DONE  "SOMEWHERE" IN [place]//A.C., Onset Date: [id number]  . Iohexol Other (See Comments)     Code: HIVES, Desc: HIVES W/ "DYE" USED FOR 1ST CT SCAN BUT NOT 2ND, NO PREMEDS USED, PT UNCERTAIN OF CIRCUMSTANCES,,?POSSIBLE MRI CONTRAST ALLERGY, ALL STUDIES DONE "SOMEWHERE" IN [place]//A.C., Onset Date: [id number]   . Iodine Hives     Review of Systems  Constitutional: Negative.   Respiratory: Negative.   Cardiovascular: Negative.  Negative for chest pain, palpitations and leg swelling.  Neurological: Negative for dizziness and headaches.  Psychiatric/Behavioral: Negative.      Today's Vitals   10/26/19 1045  BP: 140/90  Pulse: 80  Temp: 97.6 F (36.4 C)  TempSrc: Oral  SpO2: 96%  Weight: 199 lb 6.4 oz (90.4 kg)  Height: 5' 2.2" (1.58 m)   Body mass index is 36.24 kg/m.   Objective:  Physical Exam Constitutional:      General: She is not in acute distress.    Appearance: Normal appearance. She is obese.  Cardiovascular:     Rate and Rhythm: Normal rate and regular rhythm.     Pulses: Normal pulses.     Heart sounds: Normal heart sounds. No murmur.  Pulmonary:     Effort: Pulmonary effort is normal. No respiratory distress.     Breath sounds: Normal breath sounds.  Skin:    Capillary Refill: Capillary refill takes less than 2 seconds.  Neurological:     General: No focal deficit present.     Mental Status: She is alert and oriented to person, place, and time.  Psychiatric:        Mood and Affect: Mood normal.        Behavior: Behavior normal.        Thought Content: Thought content normal.        Judgment: Judgment normal.         Assessment And Plan:     1. Dyspnea on exertion  Walk test resulted in oxygen level dropped to 87% for approximately 5 seconds. Offered to order her oxygen concentrator for home and she declines at this time stating "it has been off and on with these symptoms."  Patient refuses to have oxygen therapy at home due to likely having a  concentrator  2. Imbalance  She will intermittently be off balance and would like a referral for PT to evaluate for safety and balance issues - Ambulatory referral to [place]  3. At moderate  risk for fall  No falls, referral made for PT - Ambulatory referral to [facility]  4. Morbid (severe) obesity due to excess calories ([place])  Discussed increasing physical activity as tolerated  5. Rash and nonspecific skin eruption  Irritation to skin around old surgical site of abdomen  Nystatin powder sent to pharmacy - nystatin (NYSTATIN) powder; Apply topically 4 (four) times daily.  Dispense: 15 g; Refill: 0  [name], FNP    THE PATIENT IS ENCOURAGED TO PRACTICE SOCIAL DISTANCING DUE TO THE COVID-19 PANDEMIC.

## 2019-10-27 ENCOUNTER — Ambulatory Visit: Payer: Self-pay

## 2019-10-27 ENCOUNTER — Telehealth: Payer: Self-pay

## 2019-10-27 DIAGNOSIS — D631 Anemia in chronic kidney disease: Secondary | ICD-10-CM | POA: Diagnosis not present

## 2019-10-27 DIAGNOSIS — Z992 Dependence on renal dialysis: Secondary | ICD-10-CM | POA: Diagnosis not present

## 2019-10-27 DIAGNOSIS — I5032 Chronic diastolic (congestive) heart failure: Secondary | ICD-10-CM | POA: Diagnosis not present

## 2019-10-27 DIAGNOSIS — R2689 Other abnormalities of gait and mobility: Secondary | ICD-10-CM

## 2019-10-27 DIAGNOSIS — N186 End stage renal disease: Secondary | ICD-10-CM | POA: Diagnosis not present

## 2019-10-27 DIAGNOSIS — Z23 Encounter for immunization: Secondary | ICD-10-CM | POA: Diagnosis not present

## 2019-10-27 DIAGNOSIS — N2581 Secondary hyperparathyroidism of renal origin: Secondary | ICD-10-CM | POA: Diagnosis not present

## 2019-10-27 DIAGNOSIS — I10 Essential (primary) hypertension: Secondary | ICD-10-CM | POA: Diagnosis not present

## 2019-10-27 NOTE — Patient Instructions (Signed)
Social Worker Visit Information  Goals we discussed today:  Goals Addressed            This Visit's Progress     Patient Stated   . "I am interested in learning more about life alert options" (pt-stated)       Current Barriers:  . Limited knowledge of health plan benefits   Social Work Clinical Goal(s):  [name] Over the next 45 days the patient will work with SW to become more knowledgeable of health plan benefits related to emergency response systems  CCM SW Interventions: Completed 10/27/2019 . Communication with the patient to discuss outcome of SW findings regarding personal emergency response system . Discussed opportunity for the patient to possibly qualify for a system under her Medicaid health plan and needing to outreach her caseworker to determine patient eligibility o Patient reports being unsure who her caseworker is . Outbound call placed to [place] regarding patient desire to obtain an emergency response system . Voice message left by SW requesting patients caseworker return call with eligibility information  Patient Self Care Activities:  . Self administers medications as prescribed . Calls pharmacy for medication refills . Calls provider office for new concerns or questions  Please see past updates related to this goal by clicking on the "Past Updates" button in the selected goal          Follow Up Plan: SW will follow up with patient by phone over the next two weeks.   [name], BSW, CDP Social Worker, Certified Dementia Practitioner [place] [facility] [place] [facility] [phone]

## 2019-10-27 NOTE — Chronic Care Management (AMB) (Signed)
Chronic Care Management    Clinical Social Work Follow Up Note  10/27/2019 Name: Whitney Walker MRN: [id number] DOB: 08-04-1956  Whitney Walker is a 63 y.o. year old female who is a primary care patient of Glendale Chard, MD. The CCM team was consulted for assistance with care coordination.   Review of patient status, including review of consultants reports, other relevant assessments, and collaboration with appropriate care team members and the patient's provider was performed as part of comprehensive patient evaluation and provision of chronic care management services.    SW received a return call from the patient in response to a voice message left requesting return call. See care plan below addressing today's SW intervention in relation to patients desire to obtain a personal emergency response system.  Outpatient Encounter Medications as of 10/27/2019  Medication Sig  . acetaminophen (TYLENOL) 500 MG tablet Take 1,000 mg by mouth 2 (two) times daily as needed for moderate pain or headache.  . albuterol (PROVENTIL HFA;VENTOLIN HFA) 108 (90 Base) MCG/ACT inhaler Inhale 2 puffs into the lungs every 6 (six) hours as needed for wheezing or shortness of breath.   . Biotin 10000 MCG TABS Take by mouth.  . calcium citrate (CALCITRATE - DOSED IN MG ELEMENTAL CALCIUM) 950 MG tablet Take 200 mg of elemental calcium by mouth 3 (three) times daily. 2 tabs with dialysis on M,W,F  . carvedilol (COREG) 6.25 MG tablet Take 6.25 mg by mouth 2 (two) times daily with a meal. Only takes once a day on dialysis days (holds in morning on Monday Wednesday and Friday)  . ELIQUIS 2.5 MG TABS tablet TAKE 1 TABLET BY MOUTH TWICE A DAY  . meclizine (ANTIVERT) 12.5 MG tablet Take 12.5 mg by mouth 3 (three) times daily as needed for dizziness.  . mometasone (ELOCON) 0.1 % cream Apply to affected area daily  . multivitamin (RENA-VIT) TABS tablet Take 1 tablet by mouth at bedtime.  . mupirocin ointment (BACTROBAN) 2 %  Apply 1 application topically daily as needed (skin bumps).  . Nutritional Supplements (FEEDING SUPPLEMENT, NEPRO CARB STEADY,) LIQD Take 237 mLs by mouth 3 (three) times a week. During dialysis, Monday, Wednesday & Friday  . nystatin (NYSTATIN) powder Apply topically 4 (four) times daily.  . promethazine (PHENERGAN) 12.5 MG tablet Take 12.5 mg by mouth every 6 (six) hours as needed for nausea or vomiting.   No facility-administered encounter medications on file as of 10/27/2019.      Goals Addressed            This Visit's Progress     Patient Stated   . "I am interested in learning more about life alert options" (pt-stated)       Current Barriers:  . Limited knowledge of health plan benefits   Social Work Clinical Goal(s):  [name] Over the next 45 days the patient will work with SW to become more knowledgeable of health plan benefits related to emergency response systems  CCM SW Interventions: Completed 10/27/2019 . Communication with the patient to discuss outcome of SW findings regarding personal emergency response system . Discussed opportunity for the patient to possibly qualify for a system under her Medicaid health plan and needing to outreach her caseworker to determine patient eligibility o Patient reports being unsure who her caseworker is . Outbound call placed to [place] regarding patient desire to obtain an emergency response system . Voice message left by SW requesting patients caseworker return call with eligibility information  Patient Self Care  Activities:  . Self administers medications as prescribed . Calls pharmacy for medication refills . Calls provider office for new concerns or questions  Please see past updates related to this goal by clicking on the "Past Updates" button in the selected goal          Follow Up Plan: SW will follow up with patient by phone over the next two weeks upon hearing back from [place] caseworker.   [name], BSW,  CDP Social Worker, Certified Dementia Practitioner [place] [facility] [place] [facility] [phone]  Total time spent performing care coordination and/or care management activities with the patient by phone or face to face = 10 minutes.

## 2019-10-27 NOTE — Chronic Care Management (AMB) (Signed)
  Chronic Care Management   Social Work Note  10/27/2019 Name: Whitney Walker MRN: [id number] DOB: September 18, 1956  SW placed an unsuccessful outbound call to the patient to follow up on progression of patient goal. SW left a HIPAA compliant voice message requesting a return call.   Follow Up Plan: SW will follow up with patient by phone over the next two weeks.  [name], BSW, CDP Social Worker, Certified Dementia Practitioner [place] [facility] [place] [facility] [phone]

## 2019-10-28 DIAGNOSIS — I5032 Chronic diastolic (congestive) heart failure: Secondary | ICD-10-CM | POA: Diagnosis not present

## 2019-10-28 DIAGNOSIS — R42 Dizziness and giddiness: Secondary | ICD-10-CM | POA: Diagnosis not present

## 2019-10-28 DIAGNOSIS — J449 Chronic obstructive pulmonary disease, unspecified: Secondary | ICD-10-CM

## 2019-10-28 DIAGNOSIS — Z7901 Long term (current) use of anticoagulants: Secondary | ICD-10-CM | POA: Diagnosis not present

## 2019-10-28 DIAGNOSIS — F419 Anxiety disorder, unspecified: Secondary | ICD-10-CM | POA: Diagnosis not present

## 2019-10-28 DIAGNOSIS — F332 Major depressive disorder, recurrent severe without psychotic features: Secondary | ICD-10-CM

## 2019-10-28 DIAGNOSIS — G4733 Obstructive sleep apnea (adult) (pediatric): Secondary | ICD-10-CM | POA: Diagnosis not present

## 2019-10-28 DIAGNOSIS — I70209 Unspecified atherosclerosis of native arteries of extremities, unspecified extremity: Secondary | ICD-10-CM | POA: Diagnosis not present

## 2019-10-28 DIAGNOSIS — Z992 Dependence on renal dialysis: Secondary | ICD-10-CM | POA: Diagnosis not present

## 2019-10-28 DIAGNOSIS — Z9181 History of falling: Secondary | ICD-10-CM | POA: Diagnosis not present

## 2019-10-28 DIAGNOSIS — Z86711 Personal history of pulmonary embolism: Secondary | ICD-10-CM | POA: Diagnosis not present

## 2019-10-28 DIAGNOSIS — G2581 Restless legs syndrome: Secondary | ICD-10-CM

## 2019-10-28 DIAGNOSIS — K7460 Unspecified cirrhosis of liver: Secondary | ICD-10-CM | POA: Diagnosis not present

## 2019-10-28 DIAGNOSIS — M545 Low back pain: Secondary | ICD-10-CM | POA: Diagnosis not present

## 2019-10-28 DIAGNOSIS — I132 Hypertensive heart and chronic kidney disease with heart failure and with stage 5 chronic kidney disease, or end stage renal disease: Secondary | ICD-10-CM | POA: Diagnosis not present

## 2019-10-28 DIAGNOSIS — F17210 Nicotine dependence, cigarettes, uncomplicated: Secondary | ICD-10-CM | POA: Diagnosis not present

## 2019-10-28 DIAGNOSIS — Z6837 Body mass index (BMI) 37.0-37.9, adult: Secondary | ICD-10-CM | POA: Diagnosis not present

## 2019-10-28 DIAGNOSIS — N186 End stage renal disease: Secondary | ICD-10-CM | POA: Diagnosis not present

## 2019-10-28 DIAGNOSIS — Z85038 Personal history of other malignant neoplasm of large intestine: Secondary | ICD-10-CM | POA: Diagnosis not present

## 2019-10-28 DIAGNOSIS — Z86718 Personal history of other venous thrombosis and embolism: Secondary | ICD-10-CM | POA: Diagnosis not present

## 2019-10-28 DIAGNOSIS — D631 Anemia in chronic kidney disease: Secondary | ICD-10-CM | POA: Diagnosis not present

## 2019-10-28 DIAGNOSIS — I2510 Atherosclerotic heart disease of native coronary artery without angina pectoris: Secondary | ICD-10-CM | POA: Diagnosis not present

## 2019-10-29 DIAGNOSIS — Z23 Encounter for immunization: Secondary | ICD-10-CM | POA: Diagnosis not present

## 2019-10-29 DIAGNOSIS — N2581 Secondary hyperparathyroidism of renal origin: Secondary | ICD-10-CM | POA: Diagnosis not present

## 2019-10-29 DIAGNOSIS — Z992 Dependence on renal dialysis: Secondary | ICD-10-CM | POA: Diagnosis not present

## 2019-10-29 DIAGNOSIS — N186 End stage renal disease: Secondary | ICD-10-CM | POA: Diagnosis not present

## 2019-10-29 DIAGNOSIS — D631 Anemia in chronic kidney disease: Secondary | ICD-10-CM | POA: Diagnosis not present

## 2019-10-31 DIAGNOSIS — I129 Hypertensive chronic kidney disease with stage 1 through stage 4 chronic kidney disease, or unspecified chronic kidney disease: Secondary | ICD-10-CM | POA: Diagnosis not present

## 2019-10-31 DIAGNOSIS — N186 End stage renal disease: Secondary | ICD-10-CM | POA: Diagnosis not present

## 2019-10-31 DIAGNOSIS — Z992 Dependence on renal dialysis: Secondary | ICD-10-CM | POA: Diagnosis not present

## 2019-11-01 ENCOUNTER — Ambulatory Visit: Payer: Self-pay

## 2019-11-01 DIAGNOSIS — N2581 Secondary hyperparathyroidism of renal origin: Secondary | ICD-10-CM | POA: Diagnosis not present

## 2019-11-01 DIAGNOSIS — Z992 Dependence on renal dialysis: Secondary | ICD-10-CM | POA: Diagnosis not present

## 2019-11-01 DIAGNOSIS — N186 End stage renal disease: Secondary | ICD-10-CM | POA: Diagnosis not present

## 2019-11-01 DIAGNOSIS — D631 Anemia in chronic kidney disease: Secondary | ICD-10-CM | POA: Diagnosis not present

## 2019-11-01 DIAGNOSIS — R2689 Other abnormalities of gait and mobility: Secondary | ICD-10-CM

## 2019-11-01 NOTE — Chronic Care Management (AMB) (Signed)
  Chronic Care Management   Social Work Note  11/01/2019 Name: Whitney Walker MRN: [id number] DOB: Jun 03, 1956  Whitney Walker is a 63 y.o. year old female who sees Glendale Chard, MD for primary care. The CCM team was consulted for assistance with care coordination.  SW placed a second outbound call to [place] to inquire if the patient qualified for an emergency response system under her Medicaid plan. SW left a voice message requesting a return call.   Follow Up Plan: SW will follow up with patient by phone over the next 1-2 weeks.  [name], BSW, CDP Social Worker, Certified Dementia Practitioner [place] [facility] [place] [facility] [phone]

## 2019-11-02 ENCOUNTER — Other Ambulatory Visit: Payer: Self-pay

## 2019-11-02 ENCOUNTER — Encounter: Payer: Self-pay | Admitting: Vascular Surgery

## 2019-11-02 ENCOUNTER — Ambulatory Visit (INDEPENDENT_AMBULATORY_CARE_PROVIDER_SITE_OTHER): Payer: Medicare Other | Admitting: Vascular Surgery

## 2019-11-02 VITALS — BP 145/88 | HR 75 | Temp 97.9°F | Resp 20 | Ht 62.2 in | Wt 199.0 lb

## 2019-11-02 DIAGNOSIS — Z992 Dependence on renal dialysis: Secondary | ICD-10-CM

## 2019-11-02 DIAGNOSIS — N186 End stage renal disease: Secondary | ICD-10-CM | POA: Diagnosis not present

## 2019-11-02 NOTE — Progress Notes (Signed)
Vascular and Vein Specialist of [place]  Patient name: Whitney Walker MRN: [id number] DOB: April 10, 1956 Sex: female  REASON FOR VISIT: Discuss options for hemodialysis  HPI: Whitney Walker is a 63 y.o. female here today for continued discussion of hemodialysis options.  I had seen her in June 2020.  She has a extensive past history of left arm access attempts with Dr.Cain.  Initially she had a left brachiocephalic fistula which infiltrated and then underwent basilic vein transposition.  This also infiltrated and she is now using a catheter.  She is having a more difficult time using her catheter with positional difficulties and replacement at [facility] and still having positional issues.  Fortunately she has had no infectious complications.  On my last visit with her I had suggested right arm fistula creation.  Also explained that she would be a candidate for left arm AV graft.  She is very emotional regarding this and is not willing to proceed at that time and is still not willing to make a final decision.  She has concerns regarding the technical ability of the technicians at her current facility.  Past Medical History:  Diagnosis Date  . Anemia   . Anxiety   . CHF (congestive heart failure) ([place])   . Colon cancer [facility])    treatment surgery  . Complication of anesthesia    after first [place]- [place] "couldnt walk after", patient denies having a spinal  . COPD (chronic obstructive pulmonary disease) ([place])   . Coronary artery disease   . Depression   . DVT (deep venous thrombosis) ([place])   . ESRD (end stage renal disease) ([place])    Hemo: MWF  . History of blood transfusion 04/2018  . Hypertension    07/07/18- no longer takes BP medications  . Meningitis   . Pain in limb 07/30/2013  . PE (pulmonary embolism)   . Peripheral vascular disease ([place])   . Restless legs   . Shortness of breath    with exertion  . Sleep apnea   . Vertigo     Family History   Problem Relation Age of Onset  . Cancer Mother 64       breast and bone  . Cancer Father 50       prostate  . Hypertension Sister   . Bleeding Disorder Sister   . Cancer Cousin 20       breast cancer   . Hypertension Daughter   . Breast cancer Neg Hx     SOCIAL HISTORY: Social History   Tobacco Use  . Smoking status: Current Some Day Smoker    Packs/day: 0.00    Years: 40.00    Pack years: 0.00    Types: Cigarettes  . Smokeless tobacco: Former Systems developer  . Tobacco comment: has an occasional cigarette  Substance Use Topics  . Alcohol use: No    Alcohol/week: 0.0 standard drinks    Comment: she used to drink alcohol, quit in 2010     Allergies  Allergen Reactions  . Carnosine     Other reaction(s): Unknown  . Gadolinium Derivatives Hives and Other (See Comments)    HIVES, Desc: HIVES W/ "DYE" USED FOR 1ST CT SCAN BUT NOT 2ND, NO PREMEDS USED, PT UNCERTAIN OF CIRCUMSTANCES,,?POSSIBLE MRI CONTRAST ALLERGY, ALL STUDIES DONE "SOMEWHERE" IN [place]//A.C., Onset Date: [id number]  . Iohexol Other (See Comments)     Code: HIVES, Desc: HIVES W/ "DYE" USED FOR 1ST CT SCAN BUT NOT 2ND, NO PREMEDS USED, PT  UNCERTAIN OF CIRCUMSTANCES,,?POSSIBLE MRI CONTRAST ALLERGY, ALL STUDIES DONE "SOMEWHERE" IN [place]//A.C., Onset Date: [id number]   . Iodine Hives  . Naltrexone     Other reaction(s): Unknown    Current Outpatient Medications  Medication Sig Dispense Refill  . acetaminophen (TYLENOL) 500 MG tablet Take 1,000 mg by mouth 2 (two) times daily as needed for moderate pain or headache.    . albuterol (PROVENTIL HFA;VENTOLIN HFA) 108 (90 Base) MCG/ACT inhaler Inhale 2 puffs into the lungs every 6 (six) hours as needed for wheezing or shortness of breath.     Lorin Picket 1 GM 210 MG(Fe) tablet     . Biotin 10000 MCG TABS Take by mouth.    . calcium citrate (CALCITRATE - DOSED IN MG ELEMENTAL CALCIUM) 950 MG tablet Take 200 mg of elemental calcium by mouth 3 (three) times daily. 2 tabs  with dialysis on M,W,F    . carvedilol (COREG) 6.25 MG tablet Take 6.25 mg by mouth 2 (two) times daily with a meal. Only takes once a day on dialysis days (holds in morning on Monday Wednesday and Friday)    . ELIQUIS 2.5 MG TABS tablet TAKE 1 TABLET BY MOUTH TWICE A DAY 60 tablet 1  . hydrOXYzine (ATARAX/VISTARIL) 25 MG tablet TK 1 T PO TID PRF ITCHING    . meclizine (ANTIVERT) 12.5 MG tablet Take 12.5 mg by mouth 3 (three) times daily as needed for dizziness.    . mometasone (ELOCON) 0.1 % cream Apply to affected area daily 45 g 1  . multivitamin (RENA-VIT) TABS tablet Take 1 tablet by mouth at bedtime.    . mupirocin ointment (BACTROBAN) 2 % Apply 1 application topically daily as needed (skin bumps). 22 g 2  . Nutritional Supplements (FEEDING SUPPLEMENT, NEPRO CARB STEADY,) LIQD Take 237 mLs by mouth 3 (three) times a week. During dialysis, Monday, Wednesday & Friday    . nystatin (NYSTATIN) powder Apply topically 4 (four) times daily. 15 g 0  . promethazine (PHENERGAN) 12.5 MG tablet Take 12.5 mg by mouth every 6 (six) hours as needed for nausea or vomiting.    . sevelamer carbonate (RENVELA) 800 MG tablet TAKE 3 TABLETS BY MOUTH THREE TIMES A DAY WITH MEALS AND 1 TABLET DAILY WITH A SNACK     No current facility-administered medications for this visit.     REVIEW OF SYSTEMS:  [X]  denotes positive finding, [ ]  denotes negative finding Cardiac  Comments:  Chest pain or chest pressure:    Shortness of breath upon exertion: x   Short of breath when lying flat:    Irregular heart rhythm:        Vascular    Pain in calf, thigh, or hip brought on by ambulation:    Pain in feet at night that wakes you up from your sleep:     Blood clot in your veins:    Leg swelling:           PHYSICAL EXAM: Vitals:   11/02/19 1514  BP: (!) 145/88  Pulse: 75  Resp: 20  Temp: 97.9 F (36.6 C)  SpO2: 97%  Weight: 199 lb (90.3 kg)  Height: 5' 2.2" (1.58 m)    GENERAL: The patient is a  well-nourished female, in no acute distress. The vital signs are documented above. CARDIOVASCULAR: 2+ radial pulses bilaterally.  She does not have large surface veins.   PULMONARY: There is good air exchange  MUSCULOSKELETAL: There are no major deformities or cyanosis. NEUROLOGIC: No  focal weakness or paresthesias are detected. SKIN: There are no ulcers or rashes noted. PSYCHIATRIC: The patient has a normal affect.  DATA:  Prior duplex had shown acceptable size of right arm cephalic vein for attempted brachiocephalic fistula.  I imaged her veins with SonoSite ultrasound this does show good caliber cephalic and basilic vein in the right upper arm  MEDICAL ISSUES: I had another very long discussion with the patient regarding options.  She remains very emotional and is still not ready to commit.  She reports that the catheter is working most days but occasionally clots the machine.  I again explained options of left upper arm AV graft versus right arm fistula attempts.  Also explained that if her catheter is not working she would need to have this replaced as well.  She wishes to consider this further and also discussed this with Dr. [name].  Will notify us if she is willing to proceed    Rosetta Posner, MD [facility] Tel [phone] Pager [phone]

## 2019-11-03 ENCOUNTER — Other Ambulatory Visit: Payer: Self-pay | Admitting: Nurse Practitioner

## 2019-11-03 ENCOUNTER — Ambulatory Visit: Payer: Self-pay

## 2019-11-03 DIAGNOSIS — I5032 Chronic diastolic (congestive) heart failure: Secondary | ICD-10-CM

## 2019-11-03 DIAGNOSIS — Z992 Dependence on renal dialysis: Secondary | ICD-10-CM | POA: Diagnosis not present

## 2019-11-03 DIAGNOSIS — I10 Essential (primary) hypertension: Secondary | ICD-10-CM

## 2019-11-03 DIAGNOSIS — H538 Other visual disturbances: Secondary | ICD-10-CM

## 2019-11-03 DIAGNOSIS — D631 Anemia in chronic kidney disease: Secondary | ICD-10-CM | POA: Diagnosis not present

## 2019-11-03 DIAGNOSIS — N2581 Secondary hyperparathyroidism of renal origin: Secondary | ICD-10-CM | POA: Diagnosis not present

## 2019-11-03 DIAGNOSIS — N186 End stage renal disease: Secondary | ICD-10-CM | POA: Diagnosis not present

## 2019-11-03 NOTE — Patient Instructions (Signed)
Visit Information  Goals Addressed      Patient Stated   . "I didn't receive a call from the Eye Specialist" (pt-stated)       Current Barriers:  [name] Knowledge Deficits related to diagnosis and treatment of impaired visual acuity secondary to patient having a "film" over her right eye  Nurse Case Manager Clinical Goal(s):  [name] Over the next 45 days, patient will work with the Eye Specialist to address needs related to her impaired visual acuity secondary to having a "film" over her right eye  CCM RN CM Interventions:  11/03/19 inbound call completed with patient   . Evaluation of current treatment plan related to impaired visual acuity and patient's adherence to plan as established by provider. . Advised patient to contact the CCM RNCM if she does not receive a call from Dr. [name] office regarding her new patient appointment by Monday, 11/08/19; Advised the initial referral was sent by PCP provider [name], FNP, in July; patient states she does not recall receiving a call from the [facility] Specialist and continues to have decreased visual acuity secondary to having a "film" over the right eye . Collaborated with [name], FNP  regarding the need for a new referral for follow up with Dr. [name], Eye Specialist; advised the initial referral was sent in July, however the patient does not recall receiving a call from this provider regarding a new patient appointment . Discussed plans with patient for ongoing care management follow up and provided patient with direct contact information for care management team . Placed an outbound call to Dr. [name] office to confirm a new referral is needed, spoke with the scheduler who states this office was not seeing patient's in July due to [place] but that once she contacted the patient to schedule an appointment, the patient declined . Alerted PCP [name], FNP that a new referral is needed  Patient Self Care Activities:  . Self administers  medications as prescribed . Attends all scheduled provider appointments . Calls pharmacy for medication refills . Performs ADL's independently . Performs IADL's independently . Calls provider office for new concerns or questions  Initial goal documentation     . "I have been stumbling more frequently" (pt-stated)       Current Barriers:  [name] Knowledge Deficits related to diagnosis and treatment for poor gait/balance  Nurse Case Manager Clinical Goal(s):  [name] Over the next 30 days, patient will work with PCP to address needs related to evaluation and treatment of patient reported episodes of "stumbling" and impaired gait disturbance  CCM RN CM Interventions:  11/03/19 inbound call completed with patient   . Evaluation of current treatment plan related to impaired gait/mobility and stumbling and patient's adherence to plan as established by provider. [name] with [name] from [place] regarding patient did not receive a PT visit on Tuesday as she was expecting; discussed patient received her PT evaluation and informed the PT at that time she will require a Tuesday, Thursday PT visit due to having hemodialysis on M, W, F; LaToya advised she has added Ms. Bunning to the T,Th schedule effective tomorrow; discussed the patient should hear from the PT later today to schedule an actual time for tomorrow's visit; patient was informed and will contact the CCM RNCM if the visit does not get completed as it should be  . Discussed plans with patient for ongoing care management follow up and provided patient with direct contact information for care  management team  Patient Self Care Activities:  . Self administers medications as prescribed . Attends all scheduled provider appointments . Calls pharmacy for medication refills . Attends church or other social activities . Performs ADL's independently . Performs IADL's independently . Calls provider office for new concerns or  questions   Please see past updates related to this goal by clicking on the "Past Updates" button in the selected goal         The patient verbalized understanding of instructions provided today and declined a print copy of patient instruction materials.   Telephone follow up appointment with care management team member scheduled for: 11/09/19  [name], RN, BSN, CCM Care Management Coordinator [place] [facility]  Direct Phone: [phone]

## 2019-11-03 NOTE — Chronic Care Management (AMB) (Signed)
Chronic Care Management   Follow Up Note   11/03/2019 Name: Whitney Walker MRN: [id number] DOB: 01-Mar-1956  Referred by: Glendale Chard, MD Reason for referral : Chronic Care Management (CCM RNCM Telephone Outreach )   Whitney Walker is a 63 y.o. year old female who is a primary care patient of Glendale Chard, MD. The CCM team was consulted for assistance with chronic disease management and care coordination needs.    Review of patient status, including review of consultants reports, relevant laboratory and other test results, and collaboration with appropriate care team members and the patient's provider was performed as part of comprehensive patient evaluation and provision of chronic care management services.    SDOH (Social Determinants of Health) screening performed today: None. See Care Plan for related entries.   Advanced Directives Status: N See Care Plan and [facility] application for related entries.  Received an inbound call from [name] who is requesting assistance with follow up with [facility] to schedule her PT and with obtaining a new referral to an Eye Specialist.   Outpatient Encounter Medications as of 11/03/2019  Medication Sig  . acetaminophen (TYLENOL) 500 MG tablet Take 1,000 mg by mouth 2 (two) times daily as needed for moderate pain or headache.  . albuterol (PROVENTIL HFA;VENTOLIN HFA) 108 (90 Base) MCG/ACT inhaler Inhale 2 puffs into the lungs every 6 (six) hours as needed for wheezing or shortness of breath.   Lorin Picket 1 GM 210 MG(Fe) tablet   . Biotin 10000 MCG TABS Take by mouth.  . calcium citrate (CALCITRATE - DOSED IN MG ELEMENTAL CALCIUM) 950 MG tablet Take 200 mg of elemental calcium by mouth 3 (three) times daily. 2 tabs with dialysis on M,W,F  . carvedilol (COREG) 6.25 MG tablet Take 6.25 mg by mouth 2 (two) times daily with a meal. Only takes once a day on dialysis days (holds in morning on Monday Wednesday and Friday)  . ELIQUIS 2.5 MG TABS tablet TAKE 1  TABLET BY MOUTH TWICE A DAY  . hydrOXYzine (ATARAX/VISTARIL) 25 MG tablet TK 1 T PO TID PRF ITCHING  . meclizine (ANTIVERT) 12.5 MG tablet Take 12.5 mg by mouth 3 (three) times daily as needed for dizziness.  . mometasone (ELOCON) 0.1 % cream Apply to affected area daily  . multivitamin (RENA-VIT) TABS tablet Take 1 tablet by mouth at bedtime.  . mupirocin ointment (BACTROBAN) 2 % Apply 1 application topically daily as needed (skin bumps).  . Nutritional Supplements (FEEDING SUPPLEMENT, NEPRO CARB STEADY,) LIQD Take 237 mLs by mouth 3 (three) times a week. During dialysis, Monday, Wednesday & Friday  . nystatin (NYSTATIN) powder Apply topically 4 (four) times daily.  . promethazine (PHENERGAN) 12.5 MG tablet Take 12.5 mg by mouth every 6 (six) hours as needed for nausea or vomiting.  . sevelamer carbonate (RENVELA) 800 MG tablet TAKE 3 TABLETS BY MOUTH THREE TIMES A DAY WITH MEALS AND 1 TABLET DAILY WITH A SNACK   No facility-administered encounter medications on file as of 11/03/2019.      Goals Addressed      Patient Stated   . "I didn't receive a call from the Eye Specialist" (pt-stated)       Current Barriers:  Whitney Walker Knowledge Deficits related to diagnosis and treatment of impaired visual acuity secondary to patient having a "film" over her right eye  Nurse Case Manager Clinical Goal(s):  Whitney Walker Over the next 45 days, patient will work with the Eye Specialist to address needs related to her  impaired visual acuity secondary to having a "film" over her right eye  CCM RN CM Interventions:  11/03/19 inbound call completed with patient   . Evaluation of current treatment plan related to impaired visual acuity and patient's adherence to plan as established by provider. . Advised patient to contact the CCM RNCM if she does not receive a call from Dr. [name] office regarding her new patient appointment by Monday, 11/08/19; Advised the initial referral was sent by PCP provider [name], FNP, in  July; patient states she does not recall receiving a call from the [name] Specialist and continues to have decreased visual acuity secondary to having a "film" over the right eye . Collaborated with [name], FNP  regarding the need for a new referral for follow up with Dr. [name], Eye Specialist; advised the initial referral was sent in July, however the patient does not recall receiving a call from this provider regarding a new patient appointment . Discussed plans with patient for ongoing care management follow up and provided patient with direct contact information for care management team . Placed an outbound call to Dr. [name] office to confirm a new referral is needed, spoke with the scheduler who states this office was not seeing patient's in July due to [place] but that once she contacted the patient to schedule an appointment, the patient declined . Alerted PCP [name], FNP that a new referral is needed  Patient Self Care Activities:  . Self administers medications as prescribed . Attends all scheduled provider appointments . Calls pharmacy for medication refills . Performs ADL's independently . Performs IADL's independently . Calls provider office for new concerns or questions  Initial goal documentation     . "I have been stumbling more frequently" (pt-stated)       Current Barriers:  Whitney Walker Knowledge Deficits related to diagnosis and treatment for poor gait/balance  Nurse Case Manager Clinical Goal(s):  Whitney Walker Over the next 30 days, patient will work with PCP to address needs related to evaluation and treatment of patient reported episodes of "stumbling" and impaired gait disturbance  CCM RN CM Interventions:  11/03/19 inbound call completed with patient   . Evaluation of current treatment plan related to impaired gait/mobility and stumbling and patient's adherence to plan as established by provider. [name] with [name] from [place] regarding patient  did not receive a PT visit on Tuesday as she was expecting; discussed patient received her PT evaluation and informed the PT at that time she will require a Tuesday, Thursday PT visit due to having hemodialysis on M, W, F; LaToya advised she has added Ms. Pouncey to the T,Th schedule effective tomorrow; discussed the patient should hear from the PT later today to schedule an actual time for tomorrow's visit; patient was informed and will contact the CCM RNCM if the visit does not get completed as it should be  . Discussed plans with patient for ongoing care management follow up and provided patient with direct contact information for care management team  Patient Self Care Activities:  . Self administers medications as prescribed . Attends all scheduled provider appointments . Calls pharmacy for medication refills . Attends church or other social activities . Performs ADL's independently . Performs IADL's independently . Calls provider office for new concerns or questions   Please see past updates related to this goal by clicking on the "Past Updates" button in the selected goal          Telephone  follow up appointment with care management team member scheduled for: 11/09/19  [name], RN, BSN, CCM Care Management Coordinator [place] [facility]  Direct Phone: [phone]

## 2019-11-04 ENCOUNTER — Ambulatory Visit (INDEPENDENT_AMBULATORY_CARE_PROVIDER_SITE_OTHER): Payer: Medicare Other

## 2019-11-04 DIAGNOSIS — I5032 Chronic diastolic (congestive) heart failure: Secondary | ICD-10-CM

## 2019-11-04 DIAGNOSIS — N186 End stage renal disease: Secondary | ICD-10-CM

## 2019-11-04 DIAGNOSIS — I10 Essential (primary) hypertension: Secondary | ICD-10-CM | POA: Diagnosis not present

## 2019-11-04 DIAGNOSIS — R2689 Other abnormalities of gait and mobility: Secondary | ICD-10-CM

## 2019-11-04 NOTE — Patient Instructions (Signed)
Social Worker Visit Information  Goals we discussed today:  Goals Addressed            This Visit's Progress     Patient Stated   . "I am interested in learning more about life alert options" (pt-stated)   On track    Current Barriers:  . Limited knowledge of health plan benefits   Social Work Clinical Goal(s):  [name] Over the next 45 days the patient will work with SW to become more knowledgeable of health plan benefits related to emergency response systems  CCM SW Interventions: Completed 11/04/2019 . Collaboration with Baxter Flattery from [place] to inquire patients plan benefits to cover an emergency response system . Determined the patient does have full Medicaid coverage  o [facility] DSS the patient would need to be screened by a caseworker to determine eligibility o Provided patients contact number to [employer] for follow up . Outbound call to the patient to communicate above interventions and informed to expect a call from a caseworker to assess eligibility of benefits  Patient Self Care Activities:  . Self administers medications as prescribed . Calls pharmacy for medication refills . Calls provider office for new concerns or questions  Please see past updates related to this goal by clicking on the "Past Updates" button in the selected goal          Follow Up Plan: SW will follow up with patient by phone over the next month.   [name], BSW, CDP Social Worker, Certified Dementia Practitioner [place] [facility] [place] [facility] [phone]

## 2019-11-04 NOTE — Chronic Care Management (AMB) (Signed)
Chronic Care Management     Social Work Follow Up Note  11/04/2019 Name: Whitney Walker MRN: [id number] DOB: December 03, 1956  Whitney Walker is a 63 y.o. year old female who is a primary care patient of [name], [place]. The CCM team was consulted for assistance with care coordination.   Review of patient status, including review of consultants reports, other relevant assessments, and collaboration with appropriate care team members and the patient's provider was performed as part of comprehensive patient evaluation and provision of chronic care management services.    SW assisted with care coordination in determining payor benefits to obtain a personal emergency response system.  Outpatient Encounter Medications as of 11/04/2019  Medication Sig  . acetaminophen (TYLENOL) 500 MG tablet Take 1,000 mg by mouth 2 (two) times daily as needed for moderate pain or headache.  . albuterol (PROVENTIL HFA;VENTOLIN HFA) 108 (90 Base) MCG/ACT inhaler Inhale 2 puffs into the lungs every 6 (six) hours as needed for wheezing or shortness of breath.   Lorin Picket 1 GM 210 MG(Fe) tablet   . Biotin 10000 MCG TABS Take by mouth.  . calcium citrate (CALCITRATE - DOSED IN MG ELEMENTAL CALCIUM) 950 MG tablet Take 200 mg of elemental calcium by mouth 3 (three) times daily. 2 tabs with dialysis on M,W,F  . carvedilol (COREG) 6.25 MG tablet Take 6.25 mg by mouth 2 (two) times daily with a meal. Only takes once a day on dialysis days (holds in morning on Monday Wednesday and Friday)  . ELIQUIS 2.5 MG TABS tablet TAKE 1 TABLET BY MOUTH TWICE A DAY  . hydrOXYzine (ATARAX/VISTARIL) 25 MG tablet TK 1 T PO TID PRF ITCHING  . meclizine (ANTIVERT) 12.5 MG tablet Take 12.5 mg by mouth 3 (three) times daily as needed for dizziness.  . mometasone (ELOCON) 0.1 % cream Apply to affected area daily  . multivitamin (RENA-VIT) TABS tablet Take 1 tablet by mouth at bedtime.  . mupirocin ointment (BACTROBAN) 2 % Apply 1 application  topically daily as needed (skin bumps).  . Nutritional Supplements (FEEDING SUPPLEMENT, NEPRO CARB STEADY,) LIQD Take 237 mLs by mouth 3 (three) times a week. During dialysis, Monday, Wednesday & Friday  . nystatin (NYSTATIN) powder Apply topically 4 (four) times daily.  . promethazine (PHENERGAN) 12.5 MG tablet Take 12.5 mg by mouth every 6 (six) hours as needed for nausea or vomiting.  . sevelamer carbonate (RENVELA) 800 MG tablet TAKE 3 TABLETS BY MOUTH THREE TIMES A DAY WITH MEALS AND 1 TABLET DAILY WITH A SNACK   No facility-administered encounter medications on file as of 11/04/2019.      Goals Addressed            This Visit's Progress     Patient Stated   . "I am interested in learning more about life alert options" (pt-stated)   On track    Current Barriers:  . Limited knowledge of health plan benefits   Social Work Clinical Goal(s):  [name] Over the next 45 days the patient will work with SW to become more knowledgeable of health plan benefits related to emergency response systems  CCM SW Interventions: Completed 11/04/2019 . Collaboration with Baxter Flattery from [place] to inquire patients plan benefits to cover an emergency response system . Determined the patient does have full Medicaid coverage  o [facility] DSS the patient would need to be screened by a caseworker to determine eligibility o Provided patients contact number to [place] for follow up .  Outbound call to the patient to communicate above interventions and informed to expect a call from a caseworker to assess eligibility of benefits  Patient Self Care Activities:  . Self administers medications as prescribed . Calls pharmacy for medication refills . Calls provider office for new concerns or questions  Please see past updates related to this goal by clicking on the "Past Updates" button in the selected goal          Follow Up Plan: SW will follow up with patient by phone over  the next month.   [name], BSW, CDP Social Worker, Certified Dementia Practitioner [facility] [phone]  Total time spent performing care coordination and/or care management activities with the patient by phone or face to face = 30 minutes.

## 2019-11-05 DIAGNOSIS — N186 End stage renal disease: Secondary | ICD-10-CM | POA: Diagnosis not present

## 2019-11-05 DIAGNOSIS — N2581 Secondary hyperparathyroidism of renal origin: Secondary | ICD-10-CM | POA: Diagnosis not present

## 2019-11-05 DIAGNOSIS — Z992 Dependence on renal dialysis: Secondary | ICD-10-CM | POA: Diagnosis not present

## 2019-11-05 DIAGNOSIS — D631 Anemia in chronic kidney disease: Secondary | ICD-10-CM | POA: Diagnosis not present

## 2019-11-08 DIAGNOSIS — N2581 Secondary hyperparathyroidism of renal origin: Secondary | ICD-10-CM | POA: Diagnosis not present

## 2019-11-08 DIAGNOSIS — N186 End stage renal disease: Secondary | ICD-10-CM | POA: Diagnosis not present

## 2019-11-08 DIAGNOSIS — D631 Anemia in chronic kidney disease: Secondary | ICD-10-CM | POA: Diagnosis not present

## 2019-11-08 DIAGNOSIS — Z992 Dependence on renal dialysis: Secondary | ICD-10-CM | POA: Diagnosis not present

## 2019-11-09 ENCOUNTER — Telehealth: Payer: Self-pay

## 2019-11-09 ENCOUNTER — Ambulatory Visit: Payer: Self-pay

## 2019-11-09 DIAGNOSIS — I5032 Chronic diastolic (congestive) heart failure: Secondary | ICD-10-CM

## 2019-11-09 DIAGNOSIS — I10 Essential (primary) hypertension: Secondary | ICD-10-CM

## 2019-11-09 DIAGNOSIS — N186 End stage renal disease: Secondary | ICD-10-CM

## 2019-11-09 NOTE — Patient Instructions (Signed)
Visit Information  Goals Addressed      Patient Stated   . "I have been stumbling more frequently" (pt-stated)   Not on track    Current Barriers:  [name] Knowledge Deficits related to diagnosis and treatment for poor gait/balance  Nurse Case Manager Clinical Goal(s):  [name] Over the next 30 days, patient will work with PCP to address needs related to evaluation and treatment of patient reported episodes of "stumbling" and impaired gait disturbance  CCM RN CM Interventions:  11/09/19 inbound call completed with patient   . Inbound call received from patient advising she was scheduled to start in home PT today but has decided to hold off on receiving this therapy due to fear of COVID-19; patient states she contacted the PT to notify her  . Evaluation of current treatment plan related to impaired gait/mobility and stumbling and patient's adherence to plan as established by provider. [name] with PCP provider [name], FNP regarding will hold off on receiving in home PT at this time due to fear of COVID . Discussed plans with patient for ongoing care management follow up and provided patient with direct contact information for care management team  Patient Self Care Activities:  . Self administers medications as prescribed . Attends all scheduled provider appointments . Calls pharmacy for medication refills . Attends church or other social activities . Performs ADL's independently . Performs IADL's independently . Calls provider office for new concerns or questions  Please see past updates related to this goal by clicking on the "Past Updates" button in the selected goal      . "My dialysis treatments are not going very good" (pt-stated)   Not on track    Current Barriers:  [name] Knowledge Deficits related to ESRD . Dysfunctional dialysis access  Nurse Case Manager Clinical Goal(s):  [name] Over the next 90 days, patient will work with the [facility] to address needs related to ESRD for  disease education related to hemodialysis treatment, fluid balance, dietary concerns and hemodialysis access  CCM RN CM Interventions:  11/10//20 call completed with patient  . Inbound call received from patient to discuss the patient ed mats mailed to her regarding her ESRD and dialysis options . Evaluation of current treatment plan related to ESRD and patient's adherence to plan as established by provider. . Reviewed and discussed the patient ed materials mailed to patient related to ESRD and dialysis options; Reinforced education to patient re: the benefits of receiving hemodialysis via Arteriovenous Fistula verses a PermaCath; Educated patient about Peritoneal dialysis and encouraged patient to discuss this further with her Nephrologist; discussed patient has an AVF that is underdeveloped, she's had multiple revisions; discussed her Charolette Forward Cath is positional and malfunctions during most treatments . Encouraged patient to discuss her concerns and options with her Nephrologist; encouraged to review her labs with the doctor and discuss how her treatments are going with persistent interruptions in her treatments due to poor catheter function . Discussed plans with patient for ongoing care management follow up and provided patient with direct contact information for care management team  Patient Self Care Activities:  . Self administers medications as prescribed . Attends all scheduled provider appointments . Calls pharmacy for medication refills . Attends church or other social activities . Performs ADL's independently . Performs IADL's independently . Calls provider office for new concerns or questions  Please see past updates related to this goal by clicking on the "Past Updates" button in the selected goal  The patient verbalized understanding of instructions provided today and declined a print copy of patient instruction materials.   Telephone follow up appointment with care  management team member scheduled for: 12/14/19  [name], RN, BSN, CCM Care Management Coordinator [facility]  Direct Phone: [phone]

## 2019-11-09 NOTE — Chronic Care Management (AMB) (Signed)
Chronic Care Management   Follow Up Note   11/09/2019 Name: Whitney Walker MRN: [id number] DOB: 06-19-1956  Referred by: Glendale Chard, MD Reason for referral : Chronic Care Management (CCM RNCM Telephone Follow up )   Whitney Walker is a 63 y.o. year old female who is a primary care patient of Glendale Chard, MD. The CCM team was consulted for assistance with chronic disease management and care coordination needs.    Review of patient status, including review of consultants reports, relevant laboratory and other test results, and collaboration with appropriate care team members and the patient's provider was performed as part of comprehensive patient evaluation and provision of chronic care management services.    SDOH (Social Determinants of Health) screening performed today: None. See Care Plan for related entries.  Inbound call received from patient to discuss her ESRD and review the patient educational materials mailed to her.    Outpatient Encounter Medications as of 11/09/2019  Medication Sig  . acetaminophen (TYLENOL) 500 MG tablet Take 1,000 mg by mouth 2 (two) times daily as needed for moderate pain or headache.  . albuterol (PROVENTIL HFA;VENTOLIN HFA) 108 (90 Base) MCG/ACT inhaler Inhale 2 puffs into the lungs every 6 (six) hours as needed for wheezing or shortness of breath.   Whitney Walker 1 GM 210 MG(Fe) tablet   . Biotin 10000 MCG TABS Take by mouth.  . calcium citrate (CALCITRATE - DOSED IN MG ELEMENTAL CALCIUM) 950 MG tablet Take 200 mg of elemental calcium by mouth 3 (three) times daily. 2 tabs with dialysis on M,W,F  . carvedilol (COREG) 6.25 MG tablet Take 6.25 mg by mouth 2 (two) times daily with a meal. Only takes once a day on dialysis days (holds in morning on Monday Wednesday and Friday)  . ELIQUIS 2.5 MG TABS tablet TAKE 1 TABLET BY MOUTH TWICE A DAY  . hydrOXYzine (ATARAX/VISTARIL) 25 MG tablet TK 1 T PO TID PRF ITCHING  . meclizine (ANTIVERT) 12.5 MG tablet Take  12.5 mg by mouth 3 (three) times daily as needed for dizziness.  . mometasone (ELOCON) 0.1 % cream Apply to affected area daily  . multivitamin (RENA-VIT) TABS tablet Take 1 tablet by mouth at bedtime.  . mupirocin ointment (BACTROBAN) 2 % Apply 1 application topically daily as needed (skin bumps).  . Nutritional Supplements (FEEDING SUPPLEMENT, NEPRO CARB STEADY,) LIQD Take 237 mLs by mouth 3 (three) times a week. During dialysis, Monday, Wednesday & Friday  . nystatin (NYSTATIN) powder Apply topically 4 (four) times daily.  . promethazine (PHENERGAN) 12.5 MG tablet Take 12.5 mg by mouth every 6 (six) hours as needed for nausea or vomiting.  . sevelamer carbonate (RENVELA) 800 MG tablet TAKE 3 TABLETS BY MOUTH THREE TIMES A DAY WITH MEALS AND 1 TABLET DAILY WITH A SNACK   No facility-administered encounter medications on file as of 11/09/2019.      Goals Addressed      Patient Stated   . "I have been stumbling more frequently" (pt-stated)   Not on track    Current Barriers:  [name] Knowledge Deficits related to diagnosis and treatment for poor gait/balance  Nurse Case Manager Clinical Goal(s):  [name] Over the next 30 days, patient will work with PCP to address needs related to evaluation and treatment of patient reported episodes of "stumbling" and impaired gait disturbance  CCM RN CM Interventions:  11/09/19 inbound call completed with patient   . Inbound call received from patient advising she was scheduled to start in  home PT today but has decided to hold off on receiving this therapy due to fear of COVID-19; patient states she contacted the PT to notify her  . Evaluation of current treatment plan related to impaired gait/mobility and stumbling and patient's adherence to plan as established by provider. Whitney Walker with PCP provider Whitney Brine, FNP regarding will hold off on receiving in home PT at this time due to fear of COVID . Discussed plans with patient for ongoing care management  follow up and provided patient with direct contact information for care management team  Patient Self Care Activities:  . Self administers medications as prescribed . Attends all scheduled provider appointments . Calls pharmacy for medication refills . Attends church or other social activities . Performs ADL's independently . Performs IADL's independently . Calls provider office for new concerns or questions   Please see past updates related to this goal by clicking on the "Past Updates" button in the selected goal      . "My dialysis treatments are not going very good" (pt-stated)   Not on track    Current Barriers:  [name] Knowledge Deficits related to ESRD . Dysfunctional dialysis access  Nurse Case Manager Clinical Goal(s):  [name] Over the next 90 days, patient will work with the [facility] to address needs related to ESRD for disease education related to hemodialysis treatment, fluid balance, dietary concerns and hemodialysis access  CCM RN CM Interventions:  11/10//20 call completed with patient  . Inbound call received from patient to discuss the patient ed mats mailed to her regarding her ESRD and dialysis options . Evaluation of current treatment plan related to ESRD and patient's adherence to plan as established by provider. . Reviewed and discussed the patient ed materials mailed to patient related to ESRD and dialysis options; Reinforced education to patient re: the benefits of receiving hemodialysis via Arteriovenous Fistula verses a PermaCath; Educated patient about Peritoneal dialysis and encouraged patient to discuss this further with her Nephrologist; discussed patient has an AVF that is underdeveloped, she's had multiple revisions; discussed her Charolette Forward Cath is positional and malfunctions during most treatments . Encouraged patient to discuss her concerns and options with her Nephrologist; encouraged to review her labs with the doctor and discuss how her treatments are going with  persistent interruptions in her treatments due to poor catheter function . Discussed plans with patient for ongoing care management follow up and provided patient with direct contact information for care management team  Patient Self Care Activities:  . Self administers medications as prescribed . Attends all scheduled provider appointments . Calls pharmacy for medication refills . Attends church or other social activities . Performs ADL's independently . Performs IADL's independently . Calls provider office for new concerns or questions   Please see past updates related to this goal by clicking on the "Past Updates" button in the selected goal          Telephone follow up appointment with care management team member scheduled for: 12/14/19  [name], RN, BSN, CCM Care Management Coordinator [facility]  Direct Phone: [phone]

## 2019-11-10 DIAGNOSIS — Z992 Dependence on renal dialysis: Secondary | ICD-10-CM | POA: Diagnosis not present

## 2019-11-10 DIAGNOSIS — N2581 Secondary hyperparathyroidism of renal origin: Secondary | ICD-10-CM | POA: Diagnosis not present

## 2019-11-10 DIAGNOSIS — N186 End stage renal disease: Secondary | ICD-10-CM | POA: Diagnosis not present

## 2019-11-10 DIAGNOSIS — D631 Anemia in chronic kidney disease: Secondary | ICD-10-CM | POA: Diagnosis not present

## 2019-11-11 DIAGNOSIS — T8249XA Other complication of vascular dialysis catheter, initial encounter: Secondary | ICD-10-CM | POA: Diagnosis not present

## 2019-11-11 DIAGNOSIS — N186 End stage renal disease: Secondary | ICD-10-CM | POA: Diagnosis not present

## 2019-11-11 DIAGNOSIS — Z992 Dependence on renal dialysis: Secondary | ICD-10-CM | POA: Diagnosis not present

## 2019-11-12 DIAGNOSIS — Z992 Dependence on renal dialysis: Secondary | ICD-10-CM | POA: Diagnosis not present

## 2019-11-12 DIAGNOSIS — N186 End stage renal disease: Secondary | ICD-10-CM | POA: Diagnosis not present

## 2019-11-12 DIAGNOSIS — N2581 Secondary hyperparathyroidism of renal origin: Secondary | ICD-10-CM | POA: Diagnosis not present

## 2019-11-12 DIAGNOSIS — D631 Anemia in chronic kidney disease: Secondary | ICD-10-CM | POA: Diagnosis not present

## 2019-11-15 DIAGNOSIS — N2581 Secondary hyperparathyroidism of renal origin: Secondary | ICD-10-CM | POA: Diagnosis not present

## 2019-11-15 DIAGNOSIS — Z992 Dependence on renal dialysis: Secondary | ICD-10-CM | POA: Diagnosis not present

## 2019-11-15 DIAGNOSIS — D631 Anemia in chronic kidney disease: Secondary | ICD-10-CM | POA: Diagnosis not present

## 2019-11-15 DIAGNOSIS — N186 End stage renal disease: Secondary | ICD-10-CM | POA: Diagnosis not present

## 2019-11-17 DIAGNOSIS — D631 Anemia in chronic kidney disease: Secondary | ICD-10-CM | POA: Diagnosis not present

## 2019-11-17 DIAGNOSIS — N2581 Secondary hyperparathyroidism of renal origin: Secondary | ICD-10-CM | POA: Diagnosis not present

## 2019-11-17 DIAGNOSIS — N186 End stage renal disease: Secondary | ICD-10-CM | POA: Diagnosis not present

## 2019-11-17 DIAGNOSIS — Z992 Dependence on renal dialysis: Secondary | ICD-10-CM | POA: Diagnosis not present

## 2019-11-17 NOTE — Progress Notes (Signed)
[place]   [phone] [phone]   Clinic Follow up Note   Patient Care Team: Glendale Chard, MD as PCP - General (Internal Medicine) Waynetta Sandy, MD as Consulting Physician (Vascular Surgery) [facility], [facility] as Referring Physician (Nephrology) Edrick Oh, MD as Consulting Physician (Nephrology) Truitt Merle, MD as Consulting Physician (Hematology) [facility] [place], RN as [place] Management [name] as Social Worker  Date of Service:  11/18/2019  CHIEF COMPLAINT: F/u anemia, history of thrombosis   CURRENT THERAPY:  Eliquis 2.5mg  bid (dose per renal function)  INTERVAL HISTORY:  Whitney Walker is here for a follow up of anemia and h/o of thrombosis. She was last seen by me 9 months ago. She presents to the clinic alone. She notes pain from her right neck down her throat. She gets when she swallows or breathes. This has been going on for 4-5 days. She notes she just had  dialysis line placed exchange recently. This is her 3rd line placed. She also has right chest pain which started recently with palpitations also after line placement. Her Coreg was increased yesterday and her dry weight was reduced. She notes her urine output has decreased. She notes she still gets RBC growth factor at Dialysis center.    REVIEW OF SYSTEMS:   Constitutional: Denies fevers, chills or abnormal weight loss Eyes: Denies blurriness of vision Ears, nose, mouth, throat, and face: Denies mucositis or sore throat Respiratory: Denies cough, dyspnea or wheezes (+) sharp pain of left neck and deeper Cardiovascular: Denies or lower extremity swelling (+) right chest pain and palpitations.  Gastrointestinal:  Denies nausea, heartburn or change in bowel habits Skin: Denies abnormal skin rashes Lymphatics: Denies new lymphadenopathy or easy bruising  Neurological:Denies numbness, tingling or new weaknesses Behavioral/Psych: Mood is stable, no new changes  All other systems were reviewed with the patient and are negative.  MEDICAL HISTORY:  Past Medical History:  Diagnosis Date  . Anemia   . Anxiety   . CHF (congestive heart failure) ([place])   . Colon cancer [facility])    treatment surgery  . Complication of anesthesia    after first C- [place] "couldnt walk after", patient denies having a spinal  . COPD (chronic obstructive pulmonary disease) ([place])   . Coronary artery disease   . Depression   . DVT (deep venous thrombosis) ([place])   . ESRD (end stage renal disease) ([place])    Hemo: MWF  . History of blood transfusion 04/2018  . Hypertension    07/07/18- no longer takes BP medications  . Meningitis   . Pain in limb 07/30/2013  . PE (pulmonary embolism)   . Peripheral vascular disease ([place])   . Restless legs   . Shortness of breath    with exertion  . Sleep apnea   . Vertigo     SURGICAL HISTORY: Past Surgical History:  Procedure Laterality Date  . ABDOMINAL AORTAGRAM N/A 07/26/2013   Procedure: ABDOMINAL Maxcine Ham;  Surgeon: [name] Ridge Farm, MD;  Location: [facility] CATH LAB;  Service: Cardiovascular;  Laterality: N/A;  . ABDOMINAL HYSTERECTOMY    . AV FISTULA PLACEMENT Left 05/05/2018   Procedure: ARTERIOVENOUS (AV) FISTULA CREATION BRACHIOCEPHALIC;  Surgeon: Waynetta Sandy, MD;  Location: [place];  Service: Vascular;  Laterality: Left;  . AV FISTULA PLACEMENT Left 07/16/2018   Procedure: ARTERIOVENOUS FISTULA CREATION;  Surgeon: Waynetta Sandy, MD;  Location: [place];  Service:  Vascular;  Laterality: Left;  . BASCILIC VEIN TRANSPOSITION Left 09/03/2018   Procedure: BASILIC VEIN TRANSPOSITION SECOND STAGE;  Surgeon: Waynetta Sandy, MD;  Location: [place];  Service: Vascular;  Laterality: Left;  . BREAST BIOPSY Left   . CESAREAN SECTION     X 3 [id number]  . CHOLECYSTECTOMY    . CHOLECYSTECTOMY  1980/s  . COLECTOMY   2010  . DIVERTICULOSIS SURGERY-2002  2012  . FEMORAL-POPLITEAL BYPASS GRAFT Left 08/13/2013   Procedure: BYPASS GRAFT FEMORAL-POPLITEAL ARTERY WITH NON-REVERSED SAPHANEOUS VEIN; ULTRASOUND GUIDED;  Surgeon: Mal [name], MD;  Location: [place];  Service: Vascular;  Laterality: Left;  . INSERTION OF DIALYSIS CATHETER Right 04/30/2018   Procedure: INSERTION OF Right Internal Jugular DIALYSIS CATHETER;  Surgeon: [name], MD;  Location: [place];  Service: Vascular;  Laterality: Right;  . LOWER EXTREMITY ANGIOGRAM Left 07/26/2013   Procedure: LOWER EXTREMITY ANGIOGRAM;  Surgeon: [name] Parmele, MD;  Location: [facility] CATH LAB;  Service: Cardiovascular;  Laterality: Left;    I have reviewed the social history and family history with the patient and they are unchanged from previous note.  ALLERGIES:  is allergic to carnosine; gadolinium derivatives; iohexol; iodine; and naltrexone.  MEDICATIONS:  Current Outpatient Medications  Medication Sig Dispense Refill  . acetaminophen (TYLENOL) 500 MG tablet Take 1,000 mg by mouth 2 (two) times daily as needed for moderate pain or headache.    . albuterol (PROVENTIL HFA;VENTOLIN HFA) 108 (90 Base) MCG/ACT inhaler Inhale 2 puffs into the lungs every 6 (six) hours as needed for wheezing or shortness of breath.     Lorin Picket 1 GM 210 MG(Fe) tablet     . Biotin 10000 MCG TABS Take by mouth.    . calcium citrate (CALCITRATE - DOSED IN MG ELEMENTAL CALCIUM) 950 MG tablet Take 200 mg of elemental calcium by mouth 3 (three) times daily. 2 tabs with dialysis on M,W,F    . carvedilol (COREG) 6.25 MG tablet Take 6.25 mg by mouth 2 (two) times daily with a meal. Only takes once a day on dialysis days (holds in morning on Monday Wednesday and Friday)    . ELIQUIS 2.5 MG TABS tablet TAKE 1 TABLET BY MOUTH TWICE A DAY 60 tablet 1  . hydrOXYzine (ATARAX/VISTARIL) 25 MG tablet TK 1 T PO TID PRF ITCHING    . meclizine (ANTIVERT) 12.5 MG tablet Take 12.5 mg by mouth 3 (three)  times daily as needed for dizziness.    . mometasone (ELOCON) 0.1 % cream Apply to affected area daily 45 g 1  . multivitamin (RENA-VIT) TABS tablet Take 1 tablet by mouth at bedtime.    . mupirocin ointment (BACTROBAN) 2 % Apply 1 application topically daily as needed (skin bumps). 22 g 2  . Nutritional Supplements (FEEDING SUPPLEMENT, NEPRO CARB STEADY,) LIQD Take 237 mLs by mouth 3 (three) times a week. During dialysis, Monday, Wednesday & Friday    . nystatin (NYSTATIN) powder Apply topically 4 (four) times daily. 15 g 0  . promethazine (PHENERGAN) 12.5 MG tablet Take 12.5 mg by mouth every 6 (six) hours as needed for nausea or vomiting.    . sevelamer carbonate (RENVELA) 800 MG tablet TAKE 3 TABLETS BY MOUTH THREE TIMES A DAY WITH MEALS AND 1 TABLET DAILY WITH A SNACK     No current facility-administered medications for this visit.     PHYSICAL EXAMINATION: ECOG PERFORMANCE STATUS: 1 - Symptomatic but completely ambulatory  Vitals:   11/18/19 1307  BP: (!) 160/90  Pulse: 79  Resp: 16  Temp: 98.3 F (36.8 C)  SpO2: 97%   Filed Weights   11/18/19 1307  Weight: 197 lb 6.4 oz (89.5 kg)    GENERAL:alert, no distress and comfortable SKIN: skin color, texture, turgor are normal, no rashes or significant lesions (+)Right upper chest dialysis line in place, without any edema or surrounding skin change (it's covered by gauze) EYES: normal, Conjunctiva are pink and non-injected, sclera clear  NECK: supple, thyroid normal size, non-tender, without nodularity, no neck edema  LYMPH:  no palpable lymphadenopathy in the cervical, axillary  LUNGS: clear to auscultation and percussion with normal breathing effort HEART: regular rate and no lower extremity edema (+) Heart murmur ABDOMEN:abdomen soft, non-tender and normal bowel sounds Musculoskeletal:no cyanosis of digits and no clubbing  NEURO: alert & oriented x 3 with fluent speech, no focal motor/sensory deficits  LABORATORY DATA:  I  have reviewed the data as listed CBC Latest Ref Rng & Units 11/18/2019 04/22/2019 03/03/2019  WBC 4.0 - 10.5 K/uL 12.8(H) 10.8 -  Hemoglobin 12.0 - 15.0 g/dL 11.3(L) 33.3(A) 10.9(L)  Hematocrit 36.0 - 46.0 % 35.5(L) 37 32.0(L)  Platelets 150 - 400 K/uL 240 - -     CMP Latest Ref Rng & Units 11/18/2019 04/21/2019 03/03/2019  Glucose 70 - 99 mg/dL 135(H) - 87  BUN 8 - 23 mg/dL 40(H) 62(A) 31(H)  Creatinine 0.44 - 1.00 mg/dL 8.25(HH) 10.7(A) 6.40(H)  Sodium 135 - 145 mmol/L 138 - 134(L)  Potassium 3.5 - 5.1 mmol/L 5.4(H) 6.0(A) 4.2  Chloride 98 - 111 mmol/L 94(L) - 98  CO2 22 - 32 mmol/L 30 - 20(L)  Calcium 8.9 - 10.3 mg/dL 9.5 - 8.9  Total Protein 6.5 - 8.1 g/dL 7.2 - -  Total Bilirubin 0.3 - 1.2 mg/dL 0.4 - -  Alkaline Phos 38 - 126 U/L 110 - -  AST 15 - 41 U/L 19 - -  ALT 0 - 44 U/L 34 - -      RADIOGRAPHIC STUDIES: I have personally reviewed the radiological images as listed and agreed with the findings in the report. No results found.   ASSESSMENT & PLAN:  Tiyona Desouza is a 63 y.o. female with   1. Recurrent DVT and PE -She previously had episodes of PE, was treated with Xarelto. 10 months after she came off Xarelto, she developed unprovoked DVT and PE. -Ipreviouslyrecommendedher to continue anticoagulation indefinitely, if no contraindications. -Patient was previously on Coumadin, but switched to Eliquis due to her transportation issues with Coumadin check up. She takes Eliquis 2.5mg  BID, dose adjusted based on her ESRD on HD. -She previously stated she has been giving high dose hepatin during dialysis due to clotting issue, which is fine.  -Labs reviewed with mild anemia and mild leukocytosis. Physical exam normal with heart murmur. She will continue Eliquis which she is tolerating well. No bleeding or evidence of recurrent DVT  -F/u in 6 months   2. Right upper chest pain  -she developed moderate right upper chest pain 2-3 days after her dialysis catheter was  exchanged, she had similar pain before from catheter, but not this bad -exam showed no evidence of edema, skin change or tenderness. I do not have high suspicion of catheter related DVT, especially she is on Eliquis   -will observe clinically, if does not resolve or worse in a week, she will call   3. Anemia of chronic disease and anemia of iron deficiency -likely related to  her chronic kidney disease  -She takes oral iron supplement and IV iron as needed which is managed by her nephrologist at dialysis center. Per pt, she is also receiving EPO as needed during dialysis.  -I discussed she should not have ESP if her Hg is over 11. I will copy note to Dr. [name].   4. History of colon cancer in 2009  -She has completed 5 years of surveillance.  -last coloscopy done by Dr. Collene Mares within the last 5 years according to patient.Before that was in 05/2012.  5. CKD, stage V, on HD -Sheunderwentfistula surgery with Dr. Donzetta Matters on 05/05/18 and shealso starteddialysis in 04/2018, currently [facility].  -She had her third Dialysis line placed on right upper chest recently. Since then in the last week she has had right chest pain and right neck sharp pain as well as heart palpitations.   -She has heart murmur on exam but otherwise normal. I suspect this pain is related to irritation from new line and encouraged her to watch for swelling. I recommend she take Tylenol for pain and heating pad.   6. OSA, PVD, CHF -F/u with PCP and other specialists. -stable   7. Restless leg syndrome -F/u with PCP. Will continue Mirapex.  8. Anxiety and depression  -She is being followed by a Dr. Ouida Sills for counseling.Mood is stable.   Plan -Will copy note to Dr [name], regarding her concern of right upper chest pain, which I think is related to her new dialysis catheter  -Continue Eliquis  -Lab and f/u in 6 months     No problem-specific Assessment & Plan notes found for this encounter.   No orders of the defined  types were placed in this encounter.  All questions were answered. The patient knows to call the clinic with any problems, questions or concerns. No barriers to learning was detected. I spent 20 minutes counseling the patient face to face. The total time spent in the appointment was 25 minutes and more than 50% was on counseling and review of test results     Truitt Merle, MD 11/18/2019   I, Joslyn Devon, am acting as scribe for Truitt Merle, MD.   I have reviewed the above documentation for accuracy and completeness, and I agree with the above.

## 2019-11-18 ENCOUNTER — Encounter: Payer: Self-pay | Admitting: Hematology

## 2019-11-18 ENCOUNTER — Telehealth: Payer: Self-pay | Admitting: *Deleted

## 2019-11-18 ENCOUNTER — Other Ambulatory Visit: Payer: Self-pay

## 2019-11-18 ENCOUNTER — Inpatient Hospital Stay: Payer: Medicare Other | Attending: Hematology

## 2019-11-18 ENCOUNTER — Inpatient Hospital Stay (HOSPITAL_BASED_OUTPATIENT_CLINIC_OR_DEPARTMENT_OTHER): Payer: Medicare Other | Admitting: Hematology

## 2019-11-18 VITALS — BP 160/90 | HR 79 | Temp 98.3°F | Resp 16 | Ht 62.2 in | Wt 197.4 lb

## 2019-11-18 DIAGNOSIS — D638 Anemia in other chronic diseases classified elsewhere: Secondary | ICD-10-CM | POA: Diagnosis not present

## 2019-11-18 DIAGNOSIS — Z992 Dependence on renal dialysis: Secondary | ICD-10-CM | POA: Diagnosis not present

## 2019-11-18 DIAGNOSIS — I82412 Acute embolism and thrombosis of left femoral vein: Secondary | ICD-10-CM | POA: Diagnosis not present

## 2019-11-18 DIAGNOSIS — N184 Chronic kidney disease, stage 4 (severe): Secondary | ICD-10-CM | POA: Diagnosis not present

## 2019-11-18 DIAGNOSIS — Z86718 Personal history of other venous thrombosis and embolism: Secondary | ICD-10-CM | POA: Insufficient documentation

## 2019-11-18 DIAGNOSIS — Z86711 Personal history of pulmonary embolism: Secondary | ICD-10-CM | POA: Insufficient documentation

## 2019-11-18 DIAGNOSIS — N183 Chronic kidney disease, stage 3 unspecified: Secondary | ICD-10-CM

## 2019-11-18 DIAGNOSIS — D631 Anemia in chronic kidney disease: Secondary | ICD-10-CM | POA: Diagnosis not present

## 2019-11-18 DIAGNOSIS — Z7901 Long term (current) use of anticoagulants: Secondary | ICD-10-CM | POA: Diagnosis not present

## 2019-11-18 DIAGNOSIS — N186 End stage renal disease: Secondary | ICD-10-CM | POA: Insufficient documentation

## 2019-11-18 DIAGNOSIS — D72829 Elevated white blood cell count, unspecified: Secondary | ICD-10-CM | POA: Diagnosis not present

## 2019-11-18 LAB — COMPREHENSIVE METABOLIC PANEL
ALT: 34 U/L (ref 0–44)
AST: 19 U/L (ref 15–41)
Albumin: 3.2 g/dL — ABNORMAL LOW (ref 3.5–5.0)
Alkaline Phosphatase: 110 U/L (ref 38–126)
Anion gap: 14 (ref 5–15)
BUN: 40 mg/dL — ABNORMAL HIGH (ref 8–23)
CO2: 30 mmol/L (ref 22–32)
Calcium: 9.5 mg/dL (ref 8.9–10.3)
Chloride: 94 mmol/L — ABNORMAL LOW (ref 98–111)
Creatinine, Ser: 8.25 mg/dL (ref 0.44–1.00)
GFR calc Af Amer: 5 mL/min — ABNORMAL LOW (ref >60)
GFR calc non Af Amer: 5 mL/min — ABNORMAL LOW (ref >60)
Glucose, Bld: 135 mg/dL — ABNORMAL HIGH (ref 70–99)
Potassium: 5.4 mmol/L — ABNORMAL HIGH (ref 3.5–5.1)
Sodium: 138 mmol/L (ref 135–145)
Total Bilirubin: 0.4 mg/dL (ref 0.3–1.2)
Total Protein: 7.2 g/dL (ref 6.5–8.1)

## 2019-11-18 LAB — IRON AND TIBC
Iron: 39 ug/dL — ABNORMAL LOW (ref 41–142)
Saturation Ratios: 16 % — ABNORMAL LOW (ref 21–57)
TIBC: 246 ug/dL (ref 236–444)
UIBC: 207 ug/dL (ref 120–384)

## 2019-11-18 LAB — CBC WITH DIFFERENTIAL/PLATELET
Abs Immature Granulocytes: 0.04 10*3/uL (ref 0.00–0.07)
Basophils Absolute: 0 10*3/uL (ref 0.0–0.1)
Basophils Relative: 0 %
Eosinophils Absolute: 0.2 10*3/uL (ref 0.0–0.5)
Eosinophils Relative: 1 %
HCT: 35.5 % — ABNORMAL LOW (ref 36.0–46.0)
Hemoglobin: 11.3 g/dL — ABNORMAL LOW (ref 12.0–15.0)
Immature Granulocytes: 0 %
Lymphocytes Relative: 21 %
Lymphs Abs: 2.7 10*3/uL (ref 0.7–4.0)
MCH: 29.4 pg (ref 26.0–34.0)
MCHC: 31.8 g/dL (ref 30.0–36.0)
MCV: 92.4 fL (ref 80.0–100.0)
Monocytes Absolute: 1 10*3/uL (ref 0.1–1.0)
Monocytes Relative: 8 %
Neutro Abs: 8.8 10*3/uL — ABNORMAL HIGH (ref 1.7–7.7)
Neutrophils Relative %: 70 %
Platelets: 240 10*3/uL (ref 150–400)
RBC: 3.84 MIL/uL — ABNORMAL LOW (ref 3.87–5.11)
RDW: 15.8 % — ABNORMAL HIGH (ref 11.5–15.5)
WBC: 12.8 10*3/uL — ABNORMAL HIGH (ref 4.0–10.5)
nRBC: 0 % (ref 0.0–0.2)

## 2019-11-18 LAB — FERRITIN: Ferritin: 1844 ng/mL — ABNORMAL HIGH (ref 11–307)

## 2019-11-18 NOTE — Telephone Encounter (Signed)
Received call from Marie/Lab with critical creat value of 8.25.  Pt is on dialysis & value better than last visit.  Reported by phone to Dr Burr Medico. Pt seeing Dr Burr Medico today.

## 2019-11-19 ENCOUNTER — Telehealth: Payer: Self-pay

## 2019-11-19 ENCOUNTER — Telehealth: Payer: Self-pay | Admitting: Hematology

## 2019-11-19 DIAGNOSIS — D631 Anemia in chronic kidney disease: Secondary | ICD-10-CM | POA: Diagnosis not present

## 2019-11-19 DIAGNOSIS — N186 End stage renal disease: Secondary | ICD-10-CM | POA: Diagnosis not present

## 2019-11-19 DIAGNOSIS — Z992 Dependence on renal dialysis: Secondary | ICD-10-CM | POA: Diagnosis not present

## 2019-11-19 DIAGNOSIS — N2581 Secondary hyperparathyroidism of renal origin: Secondary | ICD-10-CM | POA: Diagnosis not present

## 2019-11-19 NOTE — Telephone Encounter (Signed)
Scheduled appt per 11/19 los.  Spoke with pt and she is aware of the appt date and time.

## 2019-11-21 DIAGNOSIS — D631 Anemia in chronic kidney disease: Secondary | ICD-10-CM | POA: Diagnosis not present

## 2019-11-21 DIAGNOSIS — N2581 Secondary hyperparathyroidism of renal origin: Secondary | ICD-10-CM | POA: Diagnosis not present

## 2019-11-21 DIAGNOSIS — N186 End stage renal disease: Secondary | ICD-10-CM | POA: Diagnosis not present

## 2019-11-21 DIAGNOSIS — Z992 Dependence on renal dialysis: Secondary | ICD-10-CM | POA: Diagnosis not present

## 2019-11-22 ENCOUNTER — Encounter: Payer: Self-pay | Admitting: Internal Medicine

## 2019-11-23 DIAGNOSIS — Z992 Dependence on renal dialysis: Secondary | ICD-10-CM | POA: Diagnosis not present

## 2019-11-23 DIAGNOSIS — D631 Anemia in chronic kidney disease: Secondary | ICD-10-CM | POA: Diagnosis not present

## 2019-11-23 DIAGNOSIS — N2581 Secondary hyperparathyroidism of renal origin: Secondary | ICD-10-CM | POA: Diagnosis not present

## 2019-11-23 DIAGNOSIS — N186 End stage renal disease: Secondary | ICD-10-CM | POA: Diagnosis not present

## 2019-11-26 DIAGNOSIS — N2581 Secondary hyperparathyroidism of renal origin: Secondary | ICD-10-CM | POA: Diagnosis not present

## 2019-11-26 DIAGNOSIS — N186 End stage renal disease: Secondary | ICD-10-CM | POA: Diagnosis not present

## 2019-11-26 DIAGNOSIS — Z992 Dependence on renal dialysis: Secondary | ICD-10-CM | POA: Diagnosis not present

## 2019-11-26 DIAGNOSIS — D631 Anemia in chronic kidney disease: Secondary | ICD-10-CM | POA: Diagnosis not present

## 2019-11-29 DIAGNOSIS — D631 Anemia in chronic kidney disease: Secondary | ICD-10-CM | POA: Diagnosis not present

## 2019-11-29 DIAGNOSIS — N2581 Secondary hyperparathyroidism of renal origin: Secondary | ICD-10-CM | POA: Diagnosis not present

## 2019-11-29 DIAGNOSIS — N186 End stage renal disease: Secondary | ICD-10-CM | POA: Diagnosis not present

## 2019-11-29 DIAGNOSIS — Z992 Dependence on renal dialysis: Secondary | ICD-10-CM | POA: Diagnosis not present

## 2019-12-02 ENCOUNTER — Ambulatory Visit: Payer: Medicare Other

## 2019-12-03 ENCOUNTER — Ambulatory Visit: Payer: Medicare Other

## 2019-12-03 DIAGNOSIS — N186 End stage renal disease: Secondary | ICD-10-CM

## 2019-12-03 NOTE — Patient Instructions (Signed)
Social Worker Visit Information  Goals we discussed today:  Goals Addressed            This Visit's Progress     Patient Stated   . "I am interested in learning more about life alert options" (pt-stated)   Not on track    Current Barriers:  . Limited knowledge of health plan benefits   Social Work Clinical Goal(s):  [name] Over the next 45 days the patient will work with SW to become more knowledgeable of health plan benefits related to emergency response systems  CCM SW Interventions: Completed 12/03/2019 . Outbound call placed to the patient to assess outcome of DSS referral regarding emergency alert system . Determined the patient has yet to receive a follow up call from her caseworker regarding the benefit . Advised the patient SW would outreach [place] to request the patients caseworker contact her regarding life alert benefit . Outbound call placed to DSS, SW left voice message requesting the patient receive a direct call to discuss equipment needs. SW provided patient contact name and telephone number during voice recording  Patient Self Care Activities:  . Self administers medications as prescribed . Calls pharmacy for medication refills . Calls provider office for new concerns or questions  Please see past updates related to this goal by clicking on the "Past Updates" button in the selected goal      . "My dialysis treatments are not going very good" (pt-stated)       Current Barriers:  [name] Knowledge Deficits related to ESRD . Dysfunctional dialysis access  Nurse Case Manager Clinical Goal(s):  [name] Over the next 90 days, patient will work with the [place] to address needs related to ESRD for disease education related to hemodialysis treatment, fluid balance, dietary concerns and hemodialysis access  CCM SW Interventions Completed 12/03/2019 . Outbound call to the patient to assess for care coordination needs . Discussed barriers to recent HD treatment due to the  patients back pain and uncomfortable chairs in clinic . Determined the patient did not dialyze Wednesday 12/2 due to concern of being offered a chair that caused her sciatic nerve to become severely painful . Reviewed patients HD schedule as M, W, F. Confirmed the patient did dialyze today, Friday 12/4 . Assessed for patient knowledge of symptoms to monitor when skipping appointments. Patient stated she is aware of how to manage her fluid intake.  No concerns noted during today's call . Collaboration with RN Case Manager regarding today's intervention  Patient Self Care Activities:  . Self administers medications as prescribed . Attends all scheduled provider appointments . Calls pharmacy for medication refills . Attends church or other social activities . Performs ADL's independently . Performs IADL's independently . Calls provider office for new concerns or questions   Please see past updates related to this goal by clicking on the "Past Updates" button in the selected goal          Follow Up Plan: SW will follow up with patient by phone over the next month   [name], BSW, CDP Social Worker, Certified Dementia Practitioner [place] [facility] [place] Management [phone]

## 2019-12-03 NOTE — Chronic Care Management (AMB) (Signed)
Chronic Care Management   Social Work Follow Up Note  12/03/2019 Name: Whitney Walker MRN: [id number] DOB: 05-Aug-1956  Whitney Walker is a 63 y.o. year old female who is a primary care patient of [name], [place]. The CCM team was consulted for assistance with care coordination.   Review of patient status, including review of consultants reports, other relevant assessments, and collaboration with appropriate care team members and the patient's provider was performed as part of comprehensive patient evaluation and provision of chronic care management services.    SW placed and outbound call to the patient to review patient goals and assist with care coordination needs.  Outpatient Encounter Medications as of 12/03/2019  Medication Sig   acetaminophen (TYLENOL) 500 MG tablet Take 1,000 mg by mouth 2 (two) times daily as needed for moderate pain or headache.   albuterol (PROVENTIL HFA;VENTOLIN HFA) 108 (90 Base) MCG/ACT inhaler Inhale 2 puffs into the lungs every 6 (six) hours as needed for wheezing or shortness of breath.    AURYXIA 1 GM 210 MG(Fe) tablet    Biotin 10000 MCG TABS Take by mouth.   calcium citrate (CALCITRATE - DOSED IN MG ELEMENTAL CALCIUM) 950 MG tablet Take 200 mg of elemental calcium by mouth 3 (three) times daily. 2 tabs with dialysis on M,W,F   carvedilol (COREG) 6.25 MG tablet Take 6.25 mg by mouth 2 (two) times daily with a meal. Only takes once a day on dialysis days (holds in morning on Monday Wednesday and Friday)   ELIQUIS 2.5 MG TABS tablet TAKE 1 TABLET BY MOUTH TWICE A DAY   hydrOXYzine (ATARAX/VISTARIL) 25 MG tablet TK 1 T PO TID PRF ITCHING   meclizine (ANTIVERT) 12.5 MG tablet Take 12.5 mg by mouth 3 (three) times daily as needed for dizziness.   mometasone (ELOCON) 0.1 % cream Apply to affected area daily   multivitamin (RENA-VIT) TABS tablet Take 1 tablet by mouth at bedtime.   mupirocin ointment (BACTROBAN) 2 % Apply 1 application topically daily  as needed (skin bumps).   Nutritional Supplements (FEEDING SUPPLEMENT, NEPRO CARB STEADY,) LIQD Take 237 mLs by mouth 3 (three) times a week. During dialysis, Monday, Wednesday & Friday   nystatin (NYSTATIN) powder Apply topically 4 (four) times daily.   promethazine (PHENERGAN) 12.5 MG tablet Take 12.5 mg by mouth every 6 (six) hours as needed for nausea or vomiting.   sevelamer carbonate (RENVELA) 800 MG tablet TAKE 3 TABLETS BY MOUTH THREE TIMES A DAY WITH MEALS AND 1 TABLET DAILY WITH A SNACK   No facility-administered encounter medications on file as of 12/03/2019.      Goals Addressed            This Visit's Progress     Patient Stated    "I am interested in learning more about life alert options" (pt-stated)   Not on track    Current Barriers:   Limited knowledge of health plan benefits   Social Work Clinical Goal(s):   Over the next 45 days the patient will work with SW to become more knowledgeable of health plan benefits related to emergency response systems  CCM SW Interventions: Completed 12/03/2019  Outbound call placed to the patient to assess outcome of DSS referral regarding emergency alert system  Determined the patient has yet to receive a follow up call from her caseworker regarding the benefit  Advised the patient SW would outreach [place] to request the patients caseworker contact her regarding life alert benefit  Outbound  call placed to DSS, SW left voice message requesting the patient receive a direct call to discuss equipment needs. SW provided patient contact name and telephone number during voice recording  Patient Self Care Activities:   Self administers medications as prescribed  Calls pharmacy for medication refills  Calls provider office for new concerns or questions  Please see past updates related to this goal by clicking on the "Past Updates" button in the selected goal       "My dialysis treatments are not going very  good" (pt-stated)       Current Barriers:   Knowledge Deficits related to ESRD  Dysfunctional dialysis access  Nurse Case Manager Clinical Goal(s):   Over the next 90 days, patient will work with the CCM RNCM to address needs related to ESRD for disease education related to hemodialysis treatment, fluid balance, dietary concerns and hemodialysis access  CCM SW Interventions Completed 12/03/2019  Outbound call to the patient to assess for care coordination needs  Discussed barriers to recent HD treatment due to the patients back pain and uncomfortable chairs in clinic  Determined the patient did not dialyze Wednesday 12/2 due to concern of being offered a chair that caused her sciatic nerve to become severely painful  Reviewed patients HD schedule as M, W, F. Confirmed the patient did dialyze today, Friday 12/4  Assessed for patient knowledge of symptoms to monitor when skipping appointments. Patient stated she is aware of how to manage her fluid intake.  No concerns noted during today's call  Collaboration with RN Case Manager regarding today's intervention  Patient Self Care Activities:   Self administers medications as prescribed  Attends all scheduled provider appointments  Calls pharmacy for medication refills  Attends church or other social activities  Performs ADL's independently  Performs IADL's independently  Calls provider office for new concerns or questions   Please see past updates related to this goal by clicking on the "Past Updates" button in the selected goal          Follow Up Plan: SW will follow up with patient by phone over the next month.   [name], BSW, CDP Social Worker, Certified Dementia Practitioner [place] [facility] [place] [facility] [phone]  Total time spent performing care coordination and/or care management activities with the patient by phone or face to face = 15 minutes.

## 2019-12-07 ENCOUNTER — Encounter: Payer: Self-pay | Admitting: Nurse Practitioner

## 2019-12-07 ENCOUNTER — Ambulatory Visit (INDEPENDENT_AMBULATORY_CARE_PROVIDER_SITE_OTHER): Payer: Medicare Other | Admitting: Nurse Practitioner

## 2019-12-07 ENCOUNTER — Other Ambulatory Visit: Payer: Self-pay

## 2019-12-07 VITALS — BP 146/76 | HR 75 | Temp 98.4°F | Ht 61.6 in | Wt 202.2 lb

## 2019-12-07 DIAGNOSIS — G8929 Other chronic pain: Secondary | ICD-10-CM | POA: Diagnosis not present

## 2019-12-07 DIAGNOSIS — G4700 Insomnia, unspecified: Secondary | ICD-10-CM | POA: Diagnosis not present

## 2019-12-07 DIAGNOSIS — Z992 Dependence on renal dialysis: Secondary | ICD-10-CM

## 2019-12-07 DIAGNOSIS — M5441 Lumbago with sciatica, right side: Secondary | ICD-10-CM

## 2019-12-07 MED ORDER — TRAZODONE HCL 50 MG PO TABS: 50.0000 mg | ORAL_TABLET | Freq: Every evening | ORAL | 1 refills | Status: DC | PRN

## 2019-12-07 NOTE — Progress Notes (Signed)
This visit occurred during the SARS-CoV-2 public health emergency.  Safety protocols were in place, including screening questions prior to the visit, additional usage of staff PPE, and extensive cleaning of exam room while observing appropriate contact time as indicated for disinfecting solutions.  Subjective:     Patient ID: Whitney Walker , female    DOB: May 15, 1956 , 63 y.o.   MRN: [id number]   Chief Complaint  Patient presents with  . Insomnia    She has been unable to sleep  . Back Pain    she needs a letter stating she needs a new chair for when she goes to get her dialysis    HPI  She reports she did not have dialysis Wednesday due to the uncomfortability of the chairs.    She has taken camimille tea, tylenol Pm, melatonin,   Insomnia Primary symptoms: fragmented sleep, difficulty falling asleep, no frequent awakening.  The current episode started more than one month. The onset quality is sudden. The treatment provided no relief.  Back Pain This is a recurrent (she is going to dialysis and is complaining of the new chairs) problem. The quality of the pain is described as aching. The pain does not radiate. Worse during: worse when she sits in the chairs. Pertinent negatives include no abdominal pain, chest pain, headaches or numbness. Risk factors include obesity. She has tried muscle relaxant for the symptoms. The treatment provided no relief.     Past Medical History:  Diagnosis Date  . Anemia   . Anxiety   . CHF (congestive heart failure) ([place])   . Colon cancer [facility])    treatment surgery  . Complication of anesthesia    after first C- [place] "couldnt walk after", patient denies having a spinal  . COPD (chronic obstructive pulmonary disease) ([place])   . Coronary artery disease   . Depression   . DVT (deep venous thrombosis) ([place])   . ESRD (end stage renal disease) ([place])    Hemo: MWF  . History of blood transfusion 04/2018  . Hypertension    07/07/18- no longer takes  BP medications  . Meningitis   . Pain in limb 07/30/2013  . PE (pulmonary embolism)   . Peripheral vascular disease ([place])   . Restless legs   . Shortness of breath    with exertion  . Sleep apnea   . Vertigo      Family History  Problem Relation Age of Onset  . Cancer Mother 15       breast and bone  . Cancer Father 34       prostate  . Hypertension Sister   . Bleeding Disorder Sister   . Cancer Cousin 20       breast cancer   . Hypertension Daughter   . Breast cancer Neg Hx      Current Outpatient Medications:  .  acetaminophen (TYLENOL) 500 MG tablet, Take 1,000 mg by mouth 2 (two) times daily as needed for moderate pain or headache., Disp: , Rfl:  .  albuterol (PROVENTIL HFA;VENTOLIN HFA) 108 (90 Base) MCG/ACT inhaler, Inhale 2 puffs into the lungs every 6 (six) hours as needed for wheezing or shortness of breath. , Disp: , Rfl:  .  AURYXIA 1 GM 210 MG(Fe) tablet, , Disp: , Rfl:  .  Biotin 10000 MCG TABS, Take by mouth., Disp: , Rfl:  .  calcium citrate (CALCITRATE - DOSED IN MG ELEMENTAL CALCIUM) 950 MG tablet, Take 200 mg of elemental calcium by  mouth 3 (three) times daily. 2 tabs with dialysis on M,W,F, Disp: , Rfl:  .  carvedilol (COREG) 6.25 MG tablet, Take 6.25 mg by mouth 2 (two) times daily with a meal. Only takes once a day on dialysis days (holds in morning on Monday Wednesday and Friday), Disp: , Rfl:  .  ELIQUIS 2.5 MG TABS tablet, TAKE 1 TABLET BY MOUTH TWICE A DAY, Disp: 60 tablet, Rfl: 1 .  hydrOXYzine (ATARAX/VISTARIL) 25 MG tablet, TK 1 T PO TID PRF ITCHING, Disp: , Rfl:  .  meclizine (ANTIVERT) 12.5 MG tablet, Take 12.5 mg by mouth 3 (three) times daily as needed for dizziness., Disp: , Rfl:  .  mometasone (ELOCON) 0.1 % cream, Apply to affected area daily, Disp: 45 g, Rfl: 1 .  multivitamin (RENA-VIT) TABS tablet, Take 1 tablet by mouth at bedtime., Disp: , Rfl:  .  mupirocin ointment (BACTROBAN) 2 %, Apply 1 application topically daily as needed (skin  bumps)., Disp: 22 g, Rfl: 2 .  Nutritional Supplements (FEEDING SUPPLEMENT, NEPRO CARB STEADY,) LIQD, Take 237 mLs by mouth 3 (three) times a week. During dialysis, Monday, Wednesday & Friday, Disp: , Rfl:  .  nystatin (NYSTATIN) powder, Apply topically 4 (four) times daily., Disp: 15 g, Rfl: 0 .  promethazine (PHENERGAN) 12.5 MG tablet, Take 12.5 mg by mouth every 6 (six) hours as needed for nausea or vomiting., Disp: , Rfl:  .  sevelamer carbonate (RENVELA) 800 MG tablet, TAKE 3 TABLETS BY MOUTH THREE TIMES A DAY WITH MEALS AND 1 TABLET DAILY WITH A SNACK, Disp: , Rfl:    Allergies  Allergen Reactions  . Carnosine     Other reaction(s): Unknown  . Gadolinium Derivatives Hives and Other (See Comments)    HIVES, Desc: HIVES W/ "DYE" USED FOR 1ST CT SCAN BUT NOT 2ND, NO PREMEDS USED, PT UNCERTAIN OF CIRCUMSTANCES,,?POSSIBLE MRI CONTRAST ALLERGY, ALL STUDIES DONE "SOMEWHERE" IN [place]//A.C., Onset Date: [id number]  . Iohexol Other (See Comments)     Code: HIVES, Desc: HIVES W/ "DYE" USED FOR 1ST CT SCAN BUT NOT 2ND, NO PREMEDS USED, PT UNCERTAIN OF CIRCUMSTANCES,,?POSSIBLE MRI CONTRAST ALLERGY, ALL STUDIES DONE "SOMEWHERE" IN [place]//A.C., Onset Date: 60109323   . Iodine Hives  . Naltrexone     Other reaction(s): Unknown     Review of Systems  Constitutional: Negative.   Respiratory: Negative.   Cardiovascular: Negative.  Negative for chest pain, palpitations and leg swelling.  Gastrointestinal: Negative for abdominal pain.  Musculoskeletal: Positive for back pain.  Neurological: Negative for dizziness, numbness and headaches.  Psychiatric/Behavioral: The patient has insomnia.      Today's Vitals   12/07/19 1109  BP: (!) 146/76  Pulse: 75  Temp: 98.4 F (36.9 C)  Weight: 202 lb 3.2 oz (91.7 kg)  Height: 5' 1.6" (1.565 m)  PainSc: 0-No pain   Body mass index is 37.46 kg/m.   Objective:  Physical Exam Constitutional:      Appearance: Normal appearance.   Cardiovascular:     Rate and Rhythm: Normal rate and regular rhythm.     Pulses: Normal pulses.     Heart sounds: Normal heart sounds. No murmur.  Pulmonary:     Effort: Pulmonary effort is normal. No respiratory distress.     Breath sounds: Normal breath sounds.  Musculoskeletal:        General: Tenderness (right low back) present.  Skin:    Capillary Refill: Capillary refill takes less than 2 seconds.  Neurological:  General: No focal deficit present.     Mental Status: She is alert and oriented to person, place, and time.         Assessment And Plan:     1. Insomnia, unspecified type  Continues to have difficulty with sleeping  She has tried melatonin and tylenol pm which has been ineffective reports causes her restless legs to worsen - traZODone (DESYREL) 50 MG tablet; Take 1 tablet (50 mg total) by mouth at bedtime as needed for sleep.  Dispense: 30 tablet; Refill: 1  2. Chronic right-sided low back pain with right-sided sciatica  She is having intermittent right side back pain which worsens when she is at dialysis.  Encouraged to stretch regularly  Also when she walks long distances this increases her back pain   [name], FNP    THE PATIENT IS ENCOURAGED TO PRACTICE SOCIAL DISTANCING DUE TO THE COVID-19 PANDEMIC.

## 2019-12-08 ENCOUNTER — Telehealth: Payer: Self-pay

## 2019-12-08 NOTE — Telephone Encounter (Signed)
She decided to continue taking them and she kept her appt for next month

## 2019-12-08 NOTE — Telephone Encounter (Signed)
It may take several doses before it works, if she does not want to take it that is fine she would not need to follow up in January if she wants a referral to a sleep provider for insomnia let me know.

## 2019-12-08 NOTE — Telephone Encounter (Signed)
Okay thank you

## 2019-12-08 NOTE — Telephone Encounter (Signed)
Pt called the medication you gave her yesterday at her appt to help her sleep trazodone did not work she was woke all night, it triggered her restless leg syndrome. She has an appt in Jan do she need to keep it if she does not plan on taking the medication?

## 2019-12-14 ENCOUNTER — Telehealth: Payer: Self-pay

## 2019-12-19 ENCOUNTER — Other Ambulatory Visit: Payer: Self-pay | Admitting: Hematology

## 2019-12-19 DIAGNOSIS — I82412 Acute embolism and thrombosis of left femoral vein: Secondary | ICD-10-CM

## 2019-12-29 ENCOUNTER — Ambulatory Visit: Payer: Self-pay

## 2019-12-29 ENCOUNTER — Telehealth: Payer: Self-pay

## 2019-12-29 DIAGNOSIS — N186 End stage renal disease: Secondary | ICD-10-CM

## 2019-12-29 NOTE — Chronic Care Management (AMB) (Signed)
  Chronic Care Management   Outreach Note  12/29/2019 Name: Whitney Walker MRN: [id number] DOB: 27-Jan-1956  Referred by: [name], FNP Reason for referral : Care Coordination   SW placed an unsuccessful outbound call to the patient to assess progression of patient goals. SW left a HIPAA compliant voice message requesting a return call.  Follow Up Plan: SW will follow up with the patient over the next 14 days.  [name], BSW, CDP Social Worker, Certified Dementia Practitioner [place] [facility] [place] [facility] [phone]

## 2019-12-31 DIAGNOSIS — I129 Hypertensive chronic kidney disease with stage 1 through stage 4 chronic kidney disease, or unspecified chronic kidney disease: Secondary | ICD-10-CM | POA: Diagnosis not present

## 2019-12-31 DIAGNOSIS — N186 End stage renal disease: Secondary | ICD-10-CM | POA: Diagnosis not present

## 2019-12-31 DIAGNOSIS — Z992 Dependence on renal dialysis: Secondary | ICD-10-CM | POA: Diagnosis not present

## 2020-01-01 DIAGNOSIS — Z992 Dependence on renal dialysis: Secondary | ICD-10-CM | POA: Diagnosis not present

## 2020-01-01 DIAGNOSIS — N2581 Secondary hyperparathyroidism of renal origin: Secondary | ICD-10-CM | POA: Diagnosis not present

## 2020-01-01 DIAGNOSIS — N186 End stage renal disease: Secondary | ICD-10-CM | POA: Diagnosis not present

## 2020-01-01 DIAGNOSIS — D631 Anemia in chronic kidney disease: Secondary | ICD-10-CM | POA: Diagnosis not present

## 2020-01-03 DIAGNOSIS — N186 End stage renal disease: Secondary | ICD-10-CM | POA: Diagnosis not present

## 2020-01-03 DIAGNOSIS — N2581 Secondary hyperparathyroidism of renal origin: Secondary | ICD-10-CM | POA: Diagnosis not present

## 2020-01-03 DIAGNOSIS — Z992 Dependence on renal dialysis: Secondary | ICD-10-CM | POA: Diagnosis not present

## 2020-01-03 DIAGNOSIS — D631 Anemia in chronic kidney disease: Secondary | ICD-10-CM | POA: Diagnosis not present

## 2020-01-04 ENCOUNTER — Encounter: Payer: Self-pay | Admitting: Nurse Practitioner

## 2020-01-04 ENCOUNTER — Ambulatory Visit (INDEPENDENT_AMBULATORY_CARE_PROVIDER_SITE_OTHER): Payer: Medicare Other | Admitting: Nurse Practitioner

## 2020-01-04 ENCOUNTER — Other Ambulatory Visit: Payer: Self-pay

## 2020-01-04 VITALS — BP 122/80 | HR 97 | Temp 98.5°F | Ht 62.0 in | Wt 194.0 lb

## 2020-01-04 DIAGNOSIS — I5032 Chronic diastolic (congestive) heart failure: Secondary | ICD-10-CM | POA: Diagnosis not present

## 2020-01-04 DIAGNOSIS — H269 Unspecified cataract: Secondary | ICD-10-CM

## 2020-01-04 DIAGNOSIS — G4700 Insomnia, unspecified: Secondary | ICD-10-CM | POA: Diagnosis not present

## 2020-01-04 DIAGNOSIS — N186 End stage renal disease: Secondary | ICD-10-CM | POA: Diagnosis not present

## 2020-01-04 NOTE — Progress Notes (Signed)
This visit occurred during the SARS-CoV-2 public health emergency.  Safety protocols were in place, including screening questions prior to the visit, additional usage of staff PPE, and extensive cleaning of exam room while observing appropriate contact time as indicated for disinfecting solutions.  Subjective:     Patient ID: Whitney Walker , female    DOB: [birth year] , 64 y.o.   MRN: [id number]   Chief Complaint  Patient presents with  . Insomnia    patient stated the medicine is working for her    HPI  She has been able to fall asleep earlier. Will take Trazodone as needed 3-4 times a week but in the last couple of nights she has been able to go to sleep on her own.  She reports she was having restless leg when taking Trazodone.   Insomnia Primary symptoms: fragmented sleep.  The onset quality is gradual. PMH includes: associated symptoms present.      Past Medical History:  Diagnosis Date  . Anemia   . Anxiety   . CHF (congestive heart failure) ([place])   . Colon cancer [facility])    treatment surgery  . Complication of anesthesia    after first [place]- [place] "couldnt walk after", patient denies having a spinal  . COPD (chronic obstructive pulmonary disease) ([place])   . Coronary artery disease   . Depression   . DVT (deep venous thrombosis) ([place])   . ESRD (end stage renal disease) ([place])    Hemo: MWF  . History of blood transfusion 04/2018  . Hypertension    07/07/18- no longer takes BP medications  . Meningitis   . Pain in limb 07/30/2013  . PE (pulmonary embolism)   . Peripheral vascular disease ([place])   . Restless legs   . Shortness of breath    with exertion  . Sleep apnea   . Vertigo      Family History  Problem Relation Age of Onset  . Cancer Mother 34       breast and bone  . Cancer Father 46       prostate  . Hypertension Sister   . Bleeding Disorder Sister   . Cancer Cousin 20       breast cancer   . Hypertension Daughter   . Breast cancer Neg Hx      Current  Outpatient Medications:  .  acetaminophen (TYLENOL) 500 MG tablet, Take 1,000 mg by mouth 2 (two) times daily as needed for moderate pain or headache., Disp: , Rfl:  .  albuterol (PROVENTIL HFA;VENTOLIN HFA) 108 (90 Base) MCG/ACT inhaler, Inhale 2 puffs into the lungs every 6 (six) hours as needed for wheezing or shortness of breath. , Disp: , Rfl:  .  Biotin 10000 MCG TABS, Take by mouth., Disp: , Rfl:  .  calcium citrate (CALCITRATE - DOSED IN MG ELEMENTAL CALCIUM) 950 MG tablet, Take 200 mg of elemental calcium by mouth 3 (three) times daily. 2 tabs with dialysis on M,W,F, Disp: , Rfl:  .  carvedilol (COREG) 6.25 MG tablet, Take 12.5 mg by mouth 2 (two) times daily with a meal. Only takes once a day on dialysis days (holds in morning on Monday Wednesday and Friday) , Disp: , Rfl:  .  ELIQUIS 2.5 MG TABS tablet, TAKE 1 TABLET BY MOUTH TWICE A DAY, Disp: 60 tablet, Rfl: 1 .  meclizine (ANTIVERT) 12.5 MG tablet, Take 12.5 mg by mouth 3 (three) times daily as needed for dizziness., Disp: , Rfl:  .  mometasone (ELOCON) 0.1 % cream, Apply to affected area daily, Disp: 45 g, Rfl: 1 .  multivitamin (RENA-VIT) TABS tablet, Take 1 tablet by mouth at bedtime., Disp: , Rfl:  .  mupirocin ointment (BACTROBAN) 2 %, Apply 1 application topically daily as needed (skin bumps)., Disp: 22 g, Rfl: 2 .  Nutritional Supplements (FEEDING SUPPLEMENT, NEPRO CARB STEADY,) LIQD, Take 237 mLs by mouth 3 (three) times a week. During dialysis, Monday, Wednesday & Friday, Disp: , Rfl:  .  nystatin (NYSTATIN) powder, Apply topically 4 (four) times daily., Disp: 15 g, Rfl: 0 .  promethazine (PHENERGAN) 12.5 MG tablet, Take 12.5 mg by mouth every 6 (six) hours as needed for nausea or vomiting., Disp: , Rfl:  .  sevelamer carbonate (RENVELA) 800 MG tablet, Take 4 tablets with each meal, Disp: , Rfl:  .  traZODone (DESYREL) 50 MG tablet, Take 1 tablet (50 mg total) by mouth at bedtime as needed for sleep., Disp: 30 tablet, Rfl: 1 .   AURYXIA 1 GM 210 MG(Fe) tablet, , Disp: , Rfl:    Allergies  Allergen Reactions  . Carnosine     Other reaction(s): Unknown  . Gadolinium Derivatives Hives and Other (See Comments)    HIVES, Desc: HIVES W/ "DYE" USED FOR 1ST CT SCAN BUT NOT 2ND, NO PREMEDS USED, PT UNCERTAIN OF CIRCUMSTANCES,,?POSSIBLE MRI CONTRAST ALLERGY, ALL STUDIES DONE "SOMEWHERE" IN [place]//A.C., Onset Date: [id number]  . Iohexol Other (See Comments)     Code: HIVES, Desc: HIVES W/ "DYE" USED FOR 1ST CT SCAN BUT NOT 2ND, NO PREMEDS USED, PT UNCERTAIN OF CIRCUMSTANCES,,?POSSIBLE MRI CONTRAST ALLERGY, ALL STUDIES DONE "SOMEWHERE" IN [place]//A.C., Onset Date: [id number]   . Iodine Hives  . Naltrexone     Other reaction(s): Unknown     Review of Systems  Constitutional: Negative.  Negative for fatigue.  Respiratory: Negative.  Negative for cough.   Cardiovascular: Negative.  Negative for chest pain and leg swelling.  Skin: Negative.   Neurological: Negative for dizziness and headaches.  Psychiatric/Behavioral: Negative for agitation. The patient has insomnia.      Today's Vitals   01/04/20 1126  BP: 122/80  Pulse: 97  Temp: 98.5 F (36.9 C)  TempSrc: Oral  Weight: 194 lb (88 kg)  Height: 5\' 2"  (1.575 m)  PainSc: 0-No pain   Body mass index is 35.48 kg/m.   Objective:  Physical Exam Vitals reviewed.  Constitutional:      General: She is not in acute distress.    Appearance: Normal appearance. She is obese.  Cardiovascular:     Rate and Rhythm: Normal rate and regular rhythm.     Pulses: Normal pulses.     Heart sounds: Normal heart sounds. No murmur.  Pulmonary:     Effort: Pulmonary effort is normal. No respiratory distress.     Breath sounds: Normal breath sounds.  Skin:    Capillary Refill: Capillary refill takes less than 2 seconds.  Neurological:     General: No focal deficit present.     Mental Status: She is alert and oriented to person, place, and time.     Motor: No weakness.   Psychiatric:        Mood and Affect: Mood normal.        Behavior: Behavior normal.        Thought Content: Thought content normal.        Judgment: Judgment normal.         Assessment And Plan:  1. Insomnia, unspecified type  She reports feels has helped slightly but not taking daily  2 tabs will increase her restless leg syndrome  2. Cataract of left eye, unspecified cataract type  Seen by ophthalmology and reports has a cataract but not ready for surgery  3. ESRD on dialysis (HCC)  Chronic, stable  4. Chronic diastolic heart failure (HCC)  No recent issues, followed by cardiology  5. Morbid (severe) obesity due to excess calories (HCC)  Chronic, she is encouraged to focus on healthy diet     [name], FNP    THE PATIENT IS ENCOURAGED TO PRACTICE SOCIAL DISTANCING DUE TO THE COVID-19 PANDEMIC.

## 2020-01-05 DIAGNOSIS — D631 Anemia in chronic kidney disease: Secondary | ICD-10-CM | POA: Diagnosis not present

## 2020-01-05 DIAGNOSIS — Z992 Dependence on renal dialysis: Secondary | ICD-10-CM | POA: Diagnosis not present

## 2020-01-05 DIAGNOSIS — N2581 Secondary hyperparathyroidism of renal origin: Secondary | ICD-10-CM | POA: Diagnosis not present

## 2020-01-05 DIAGNOSIS — N186 End stage renal disease: Secondary | ICD-10-CM | POA: Diagnosis not present

## 2020-01-07 DIAGNOSIS — N186 End stage renal disease: Secondary | ICD-10-CM | POA: Diagnosis not present

## 2020-01-07 DIAGNOSIS — Z992 Dependence on renal dialysis: Secondary | ICD-10-CM | POA: Diagnosis not present

## 2020-01-07 DIAGNOSIS — D631 Anemia in chronic kidney disease: Secondary | ICD-10-CM | POA: Diagnosis not present

## 2020-01-07 DIAGNOSIS — N2581 Secondary hyperparathyroidism of renal origin: Secondary | ICD-10-CM | POA: Diagnosis not present

## 2020-01-10 DIAGNOSIS — N2581 Secondary hyperparathyroidism of renal origin: Secondary | ICD-10-CM | POA: Diagnosis not present

## 2020-01-10 DIAGNOSIS — N186 End stage renal disease: Secondary | ICD-10-CM | POA: Diagnosis not present

## 2020-01-10 DIAGNOSIS — Z992 Dependence on renal dialysis: Secondary | ICD-10-CM | POA: Diagnosis not present

## 2020-01-10 DIAGNOSIS — D631 Anemia in chronic kidney disease: Secondary | ICD-10-CM | POA: Diagnosis not present

## 2020-01-11 ENCOUNTER — Ambulatory Visit: Payer: Self-pay

## 2020-01-11 ENCOUNTER — Telehealth: Payer: Self-pay

## 2020-01-11 DIAGNOSIS — N186 End stage renal disease: Secondary | ICD-10-CM

## 2020-01-11 NOTE — Chronic Care Management (AMB) (Signed)
  Chronic Care Management   Outreach Note  01/11/2020 Name: Whitney Walker MRN: [id number] DOB: February 09, 1956  Referred by: [name], FNP Reason for referral : Care Coordination   Second unsuccessful outbound call placed to the patient to assist with care coordination needs. SW left a HIPAA compliant voice message requesting a return call.  Follow Up Plan: The care management team will reach out to the patient again over the next 14 days.   [name], BSW, CDP Social Worker, Certified Dementia Practitioner [place] [facility] [place] [facility] [phone]

## 2020-01-12 DIAGNOSIS — Z992 Dependence on renal dialysis: Secondary | ICD-10-CM | POA: Diagnosis not present

## 2020-01-12 DIAGNOSIS — N186 End stage renal disease: Secondary | ICD-10-CM | POA: Diagnosis not present

## 2020-01-12 DIAGNOSIS — D631 Anemia in chronic kidney disease: Secondary | ICD-10-CM | POA: Diagnosis not present

## 2020-01-12 DIAGNOSIS — N2581 Secondary hyperparathyroidism of renal origin: Secondary | ICD-10-CM | POA: Diagnosis not present

## 2020-01-14 DIAGNOSIS — Z992 Dependence on renal dialysis: Secondary | ICD-10-CM | POA: Diagnosis not present

## 2020-01-14 DIAGNOSIS — N186 End stage renal disease: Secondary | ICD-10-CM | POA: Diagnosis not present

## 2020-01-14 DIAGNOSIS — N2581 Secondary hyperparathyroidism of renal origin: Secondary | ICD-10-CM | POA: Diagnosis not present

## 2020-01-14 DIAGNOSIS — D631 Anemia in chronic kidney disease: Secondary | ICD-10-CM | POA: Diagnosis not present

## 2020-01-17 DIAGNOSIS — N2581 Secondary hyperparathyroidism of renal origin: Secondary | ICD-10-CM | POA: Diagnosis not present

## 2020-01-17 DIAGNOSIS — Z992 Dependence on renal dialysis: Secondary | ICD-10-CM | POA: Diagnosis not present

## 2020-01-17 DIAGNOSIS — D631 Anemia in chronic kidney disease: Secondary | ICD-10-CM | POA: Diagnosis not present

## 2020-01-17 DIAGNOSIS — N186 End stage renal disease: Secondary | ICD-10-CM | POA: Diagnosis not present

## 2020-01-19 ENCOUNTER — Telehealth: Payer: Self-pay

## 2020-01-19 ENCOUNTER — Ambulatory Visit: Payer: Self-pay

## 2020-01-19 DIAGNOSIS — N2581 Secondary hyperparathyroidism of renal origin: Secondary | ICD-10-CM | POA: Diagnosis not present

## 2020-01-19 DIAGNOSIS — Z992 Dependence on renal dialysis: Secondary | ICD-10-CM | POA: Diagnosis not present

## 2020-01-19 DIAGNOSIS — D631 Anemia in chronic kidney disease: Secondary | ICD-10-CM | POA: Diagnosis not present

## 2020-01-19 DIAGNOSIS — R2689 Other abnormalities of gait and mobility: Secondary | ICD-10-CM

## 2020-01-19 DIAGNOSIS — N186 End stage renal disease: Secondary | ICD-10-CM | POA: Diagnosis not present

## 2020-01-19 DIAGNOSIS — I10 Essential (primary) hypertension: Secondary | ICD-10-CM

## 2020-01-19 NOTE — Patient Instructions (Signed)
Social Worker Visit Information  Goals we discussed today:  Goals Addressed            This Visit's Progress     Patient Stated   . COMPLETED: "I am interested in learning more about life alert options" (pt-stated)       Current Barriers:  . Limited knowledge of health plan benefits   Social Work Clinical Goal(s):  [name] Over the next 45 days the patient will work with SW to become more knowledgeable of health plan benefits related to emergency response systems  CCM SW Interventions: 01/19/20- Third unsuccessful call placed to the patient in an attempt to assess progression of patient stated goal.   Completed 12/03/2019 . Outbound call placed to the patient to assess outcome of DSS referral regarding emergency alert system . Determined the patient has yet to receive a follow up call from her caseworker regarding the benefit . Advised the patient SW would outreach [place] to request the patients caseworker contact her regarding life alert benefit . Outbound call placed to DSS, SW left voice message requesting the patient receive a direct call to discuss equipment needs. SW provided patient contact name and telephone number during voice recording  Patient Self Care Activities:  . Self administers medications as prescribed . Calls pharmacy for medication refills . Calls provider office for new concerns or questions  Please see past updates related to this goal by clicking on the "Past Updates" button in the selected goal          Materials Provided: No. Patient not reached.  Follow Up Plan: No further SW follow up planned at this time. The patient will be followed by RN Case Manager.  [name], BSW, CDP Social Worker, Certified Dementia Practitioner [place] [facility] [place] [facility] [phone]

## 2020-01-19 NOTE — Chronic Care Management (AMB) (Signed)
Chronic Care Management    Social Work Follow Up Note  01/19/2020 Name: Whitney Walker MRN: [id number] DOB: 1956-01-29  Whitney Walker is a 64 y.o. year old female who is a primary care patient of [name], [place]. The CCM team was consulted for assistance with care coordination.   Review of patient status, including review of consultants reports, other relevant assessments, and collaboration with appropriate care team members and the patient's provider was performed as part of comprehensive patient evaluation and provision of chronic care management services.    SW placed a third unsuccessful outbound call to the patient to assist with care coordination needs.   Outpatient Encounter Medications as of 01/19/2020  Medication Sig  . acetaminophen (TYLENOL) 500 MG tablet Take 1,000 mg by mouth 2 (two) times daily as needed for moderate pain or headache.  . albuterol (PROVENTIL HFA;VENTOLIN HFA) 108 (90 Base) MCG/ACT inhaler Inhale 2 puffs into the lungs every 6 (six) hours as needed for wheezing or shortness of breath.   Lorin Picket 1 GM 210 MG(Fe) tablet   . Biotin 10000 MCG TABS Take by mouth.  . calcium citrate (CALCITRATE - DOSED IN MG ELEMENTAL CALCIUM) 950 MG tablet Take 200 mg of elemental calcium by mouth 3 (three) times daily. 2 tabs with dialysis on M,W,F  . carvedilol (COREG) 6.25 MG tablet Take 12.5 mg by mouth 2 (two) times daily with a meal. Only takes once a day on dialysis days (holds in morning on Monday Wednesday and Friday)   . ELIQUIS 2.5 MG TABS tablet TAKE 1 TABLET BY MOUTH TWICE A DAY  . meclizine (ANTIVERT) 12.5 MG tablet Take 12.5 mg by mouth 3 (three) times daily as needed for dizziness.  . mometasone (ELOCON) 0.1 % cream Apply to affected area daily  . multivitamin (RENA-VIT) TABS tablet Take 1 tablet by mouth at bedtime.  . mupirocin ointment (BACTROBAN) 2 % Apply 1 application topically daily as needed (skin bumps).  . Nutritional Supplements (FEEDING SUPPLEMENT,  NEPRO CARB STEADY,) LIQD Take 237 mLs by mouth 3 (three) times a week. During dialysis, Monday, Wednesday & Friday  . nystatin (NYSTATIN) powder Apply topically 4 (four) times daily.  . promethazine (PHENERGAN) 12.5 MG tablet Take 12.5 mg by mouth every 6 (six) hours as needed for nausea or vomiting.  . sevelamer carbonate (RENVELA) 800 MG tablet Take 4 tablets with each meal  . traZODone (DESYREL) 50 MG tablet Take 1 tablet (50 mg total) by mouth at bedtime as needed for sleep.   No facility-administered encounter medications on file as of 01/19/2020.     Goals Addressed            This Visit's Progress     Patient Stated   . COMPLETED: "I am interested in learning more about life alert options" (pt-stated)       Current Barriers:  . Limited knowledge of health plan benefits   Social Work Clinical Goal(s):  [name] Over the next 45 days the patient will work with SW to become more knowledgeable of health plan benefits related to emergency response systems  CCM SW Interventions: 01/19/20- Third unsuccessful call placed to the patient in an attempt to assess progression of patient stated goal.   Completed 12/03/2019 . Outbound call placed to the patient to assess outcome of DSS referral regarding emergency alert system . Determined the patient has yet to receive a follow up call from her caseworker regarding the benefit . Advised the patient SW would outreach [facility]  DSS to request the patients caseworker contact her regarding life alert benefit . Outbound call placed to DSS, SW left voice message requesting the patient receive a direct call to discuss equipment needs. SW provided patient contact name and telephone number during voice recording  Patient Self Care Activities:  . Self administers medications as prescribed . Calls pharmacy for medication refills . Calls provider office for new concerns or questions  Please see past updates related to this goal by clicking on the  "Past Updates" button in the selected goal          Follow Up Plan: No further SW follow up planned at this time. SW has collaborated with Chief Strategy Officer who will follow-up with the patient to assist with care management needs.   [name], BSW, CDP Social Worker, Certified Dementia Practitioner [place] [facility] [place] [facility] [phone]

## 2020-01-21 DIAGNOSIS — D631 Anemia in chronic kidney disease: Secondary | ICD-10-CM | POA: Diagnosis not present

## 2020-01-21 DIAGNOSIS — N2581 Secondary hyperparathyroidism of renal origin: Secondary | ICD-10-CM | POA: Diagnosis not present

## 2020-01-21 DIAGNOSIS — Z992 Dependence on renal dialysis: Secondary | ICD-10-CM | POA: Diagnosis not present

## 2020-01-21 DIAGNOSIS — N186 End stage renal disease: Secondary | ICD-10-CM | POA: Diagnosis not present

## 2020-01-25 ENCOUNTER — Ambulatory Visit
Admission: RE | Admit: 2020-01-25 | Discharge: 2020-01-25 | Disposition: A | Discharge status: Home / Self Care | Payer: Medicare Other | Source: Ambulatory Visit | Attending: Hematology | Admitting: Hematology

## 2020-01-25 ENCOUNTER — Other Ambulatory Visit: Payer: Self-pay

## 2020-01-25 DIAGNOSIS — Z1231 Encounter for screening mammogram for malignant neoplasm of breast: Secondary | ICD-10-CM

## 2020-01-25 IMAGING — MG DIGITAL SCREENING BILAT W/ TOMO W/ CAD
8 of 15 series · 8 of 40 positions shown · non-contrast
Comparison: Previous exam(s).

CLINICAL DATA: Screening.

EXAM:
DIGITAL SCREENING BILATERAL MAMMOGRAM WITH TOMO AND CAD

[L CC synth-2D (1 of 2)]
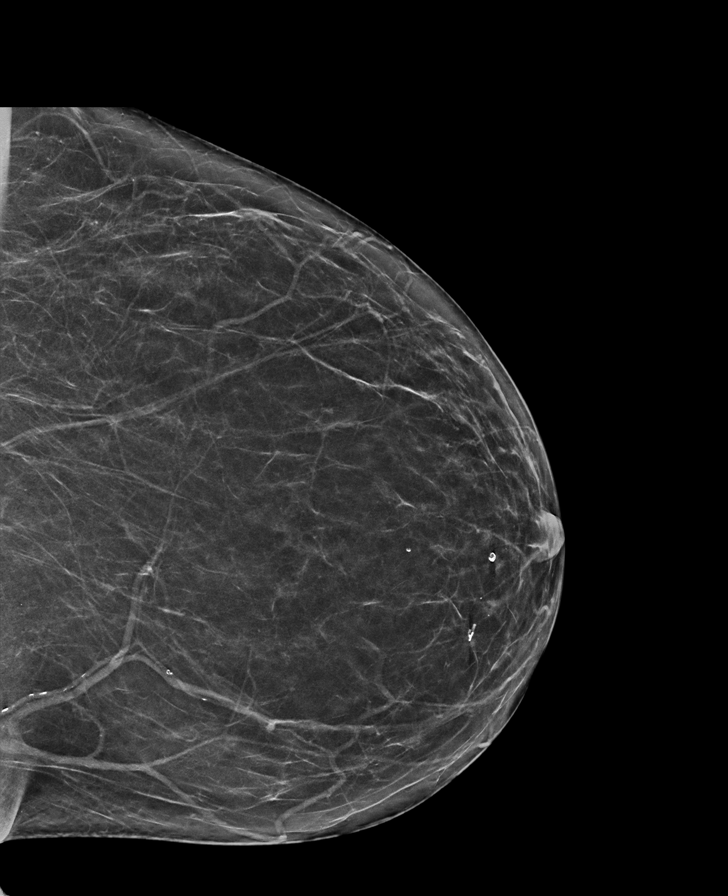

[L CC synth-2D (2 of 2)]
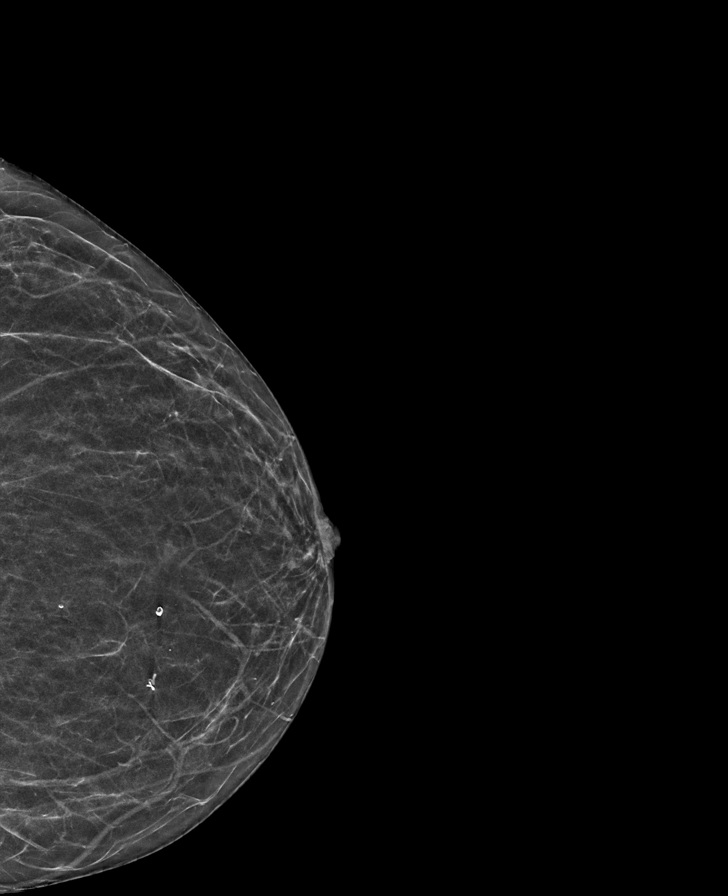

[L MLO synth-2D (1 of 2)]
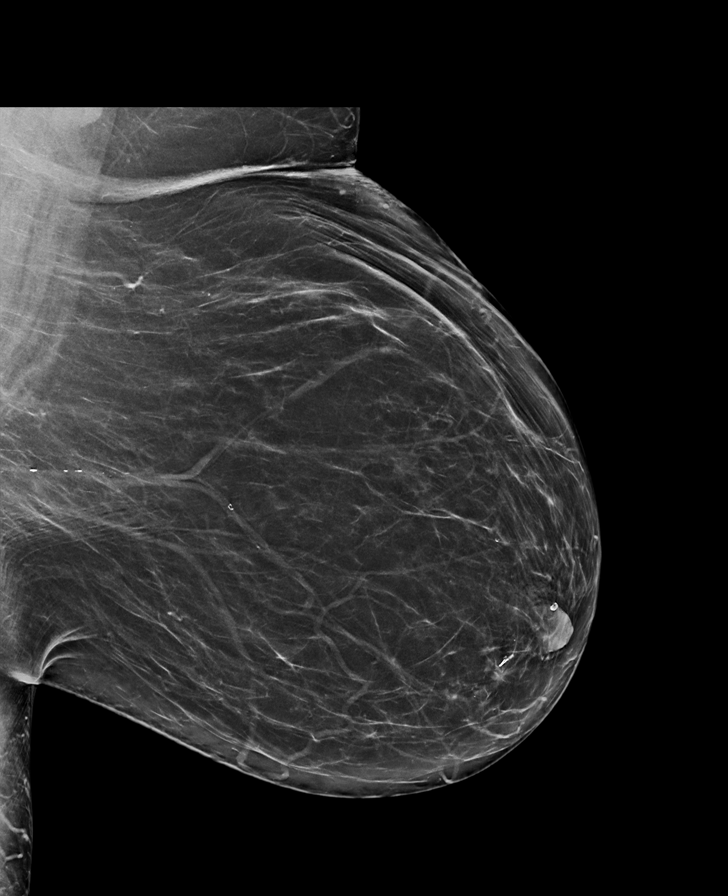

[R CC synth-2D]
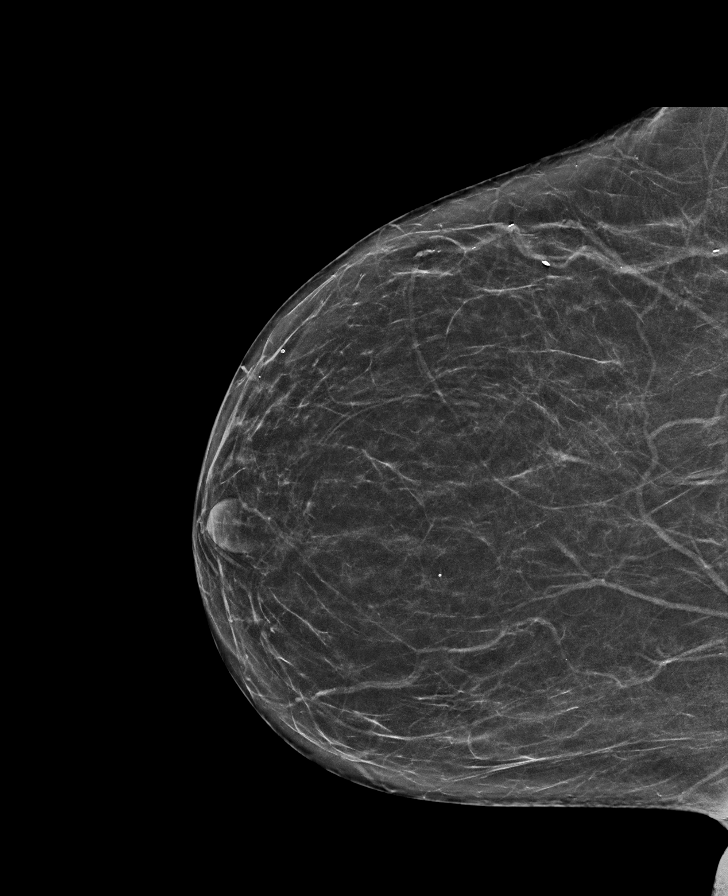

[R MLO synth-2D (1 of 2)]
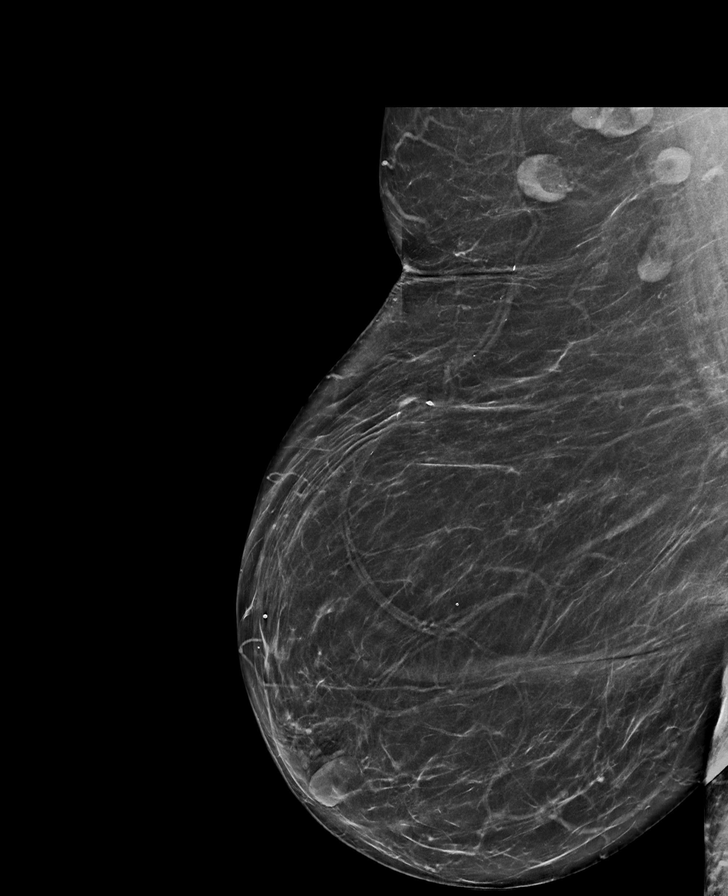

[L MLO synth-2D (2 of 2)]
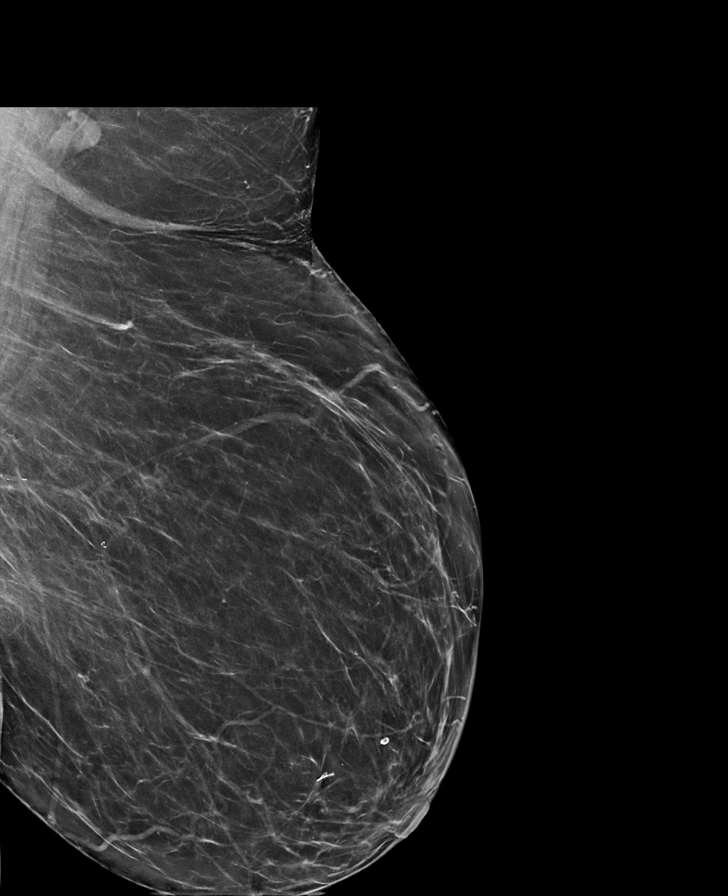

[R MLO synth-2D (2 of 2)]
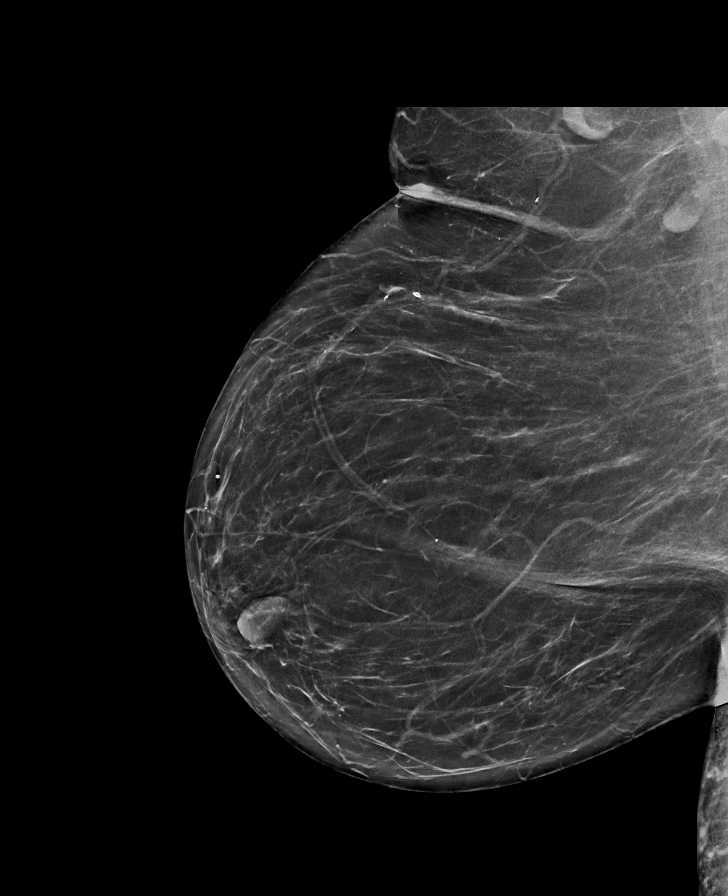

[R MLO tomo · tomo slice 53/78.0]
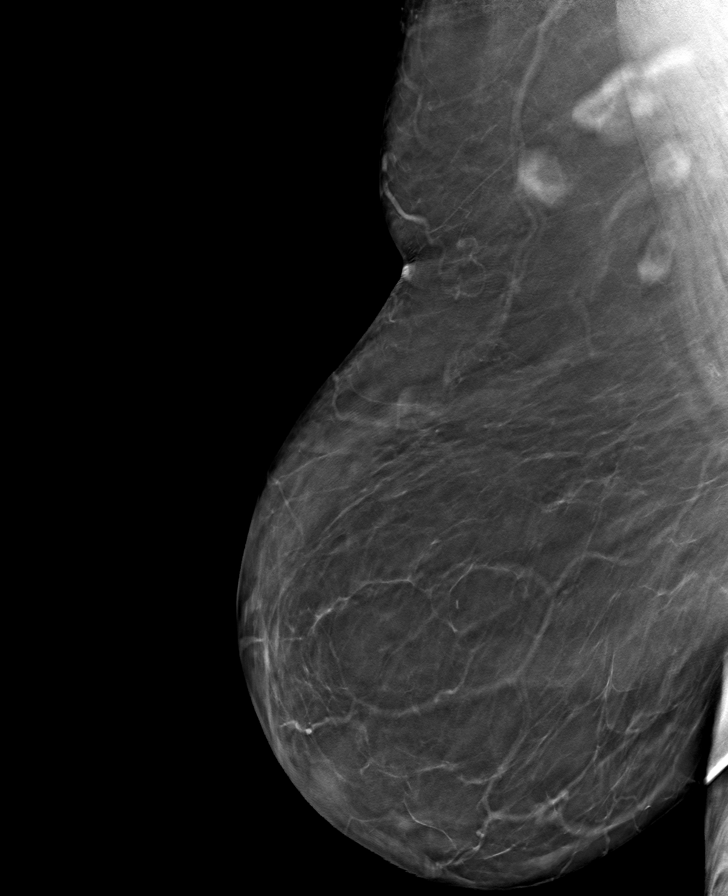

[8 of 40 positions shown; findings below may reference images not displayed]

ACR Breast Density Category b: There are scattered areas of
fibroglandular density.
FINDINGS: There are no findings suspicious for malignancy. Images were
processed with CAD.
IMPRESSION: No mammographic evidence of malignancy. A result letter of this
screening mammogram will be mailed directly to the patient.

RECOMMENDATION:
Screening mammogram in one year. (Code:[TQ])

BI-RADS CATEGORY  1: Negative.

## 2020-01-26 DIAGNOSIS — Z992 Dependence on renal dialysis: Secondary | ICD-10-CM | POA: Diagnosis not present

## 2020-01-26 DIAGNOSIS — N186 End stage renal disease: Secondary | ICD-10-CM | POA: Diagnosis not present

## 2020-01-26 DIAGNOSIS — D631 Anemia in chronic kidney disease: Secondary | ICD-10-CM | POA: Diagnosis not present

## 2020-01-26 DIAGNOSIS — N2581 Secondary hyperparathyroidism of renal origin: Secondary | ICD-10-CM | POA: Diagnosis not present

## 2020-01-28 DIAGNOSIS — Z992 Dependence on renal dialysis: Secondary | ICD-10-CM | POA: Diagnosis not present

## 2020-01-28 DIAGNOSIS — N186 End stage renal disease: Secondary | ICD-10-CM | POA: Diagnosis not present

## 2020-01-28 DIAGNOSIS — D631 Anemia in chronic kidney disease: Secondary | ICD-10-CM | POA: Diagnosis not present

## 2020-01-28 DIAGNOSIS — N2581 Secondary hyperparathyroidism of renal origin: Secondary | ICD-10-CM | POA: Diagnosis not present

## 2020-01-31 DIAGNOSIS — D631 Anemia in chronic kidney disease: Secondary | ICD-10-CM | POA: Diagnosis not present

## 2020-01-31 DIAGNOSIS — Z992 Dependence on renal dialysis: Secondary | ICD-10-CM | POA: Diagnosis not present

## 2020-01-31 DIAGNOSIS — E875 Hyperkalemia: Secondary | ICD-10-CM | POA: Diagnosis not present

## 2020-01-31 DIAGNOSIS — N186 End stage renal disease: Secondary | ICD-10-CM | POA: Diagnosis not present

## 2020-01-31 DIAGNOSIS — I129 Hypertensive chronic kidney disease with stage 1 through stage 4 chronic kidney disease, or unspecified chronic kidney disease: Secondary | ICD-10-CM | POA: Diagnosis not present

## 2020-01-31 DIAGNOSIS — N2581 Secondary hyperparathyroidism of renal origin: Secondary | ICD-10-CM | POA: Diagnosis not present

## 2020-02-02 DIAGNOSIS — N186 End stage renal disease: Secondary | ICD-10-CM | POA: Diagnosis not present

## 2020-02-02 DIAGNOSIS — N2581 Secondary hyperparathyroidism of renal origin: Secondary | ICD-10-CM | POA: Diagnosis not present

## 2020-02-02 DIAGNOSIS — Z992 Dependence on renal dialysis: Secondary | ICD-10-CM | POA: Diagnosis not present

## 2020-02-02 DIAGNOSIS — D631 Anemia in chronic kidney disease: Secondary | ICD-10-CM | POA: Diagnosis not present

## 2020-02-02 DIAGNOSIS — E875 Hyperkalemia: Secondary | ICD-10-CM | POA: Diagnosis not present

## 2020-02-04 DIAGNOSIS — N186 End stage renal disease: Secondary | ICD-10-CM | POA: Diagnosis not present

## 2020-02-04 DIAGNOSIS — N2581 Secondary hyperparathyroidism of renal origin: Secondary | ICD-10-CM | POA: Diagnosis not present

## 2020-02-04 DIAGNOSIS — D631 Anemia in chronic kidney disease: Secondary | ICD-10-CM | POA: Diagnosis not present

## 2020-02-04 DIAGNOSIS — E875 Hyperkalemia: Secondary | ICD-10-CM | POA: Diagnosis not present

## 2020-02-04 DIAGNOSIS — Z992 Dependence on renal dialysis: Secondary | ICD-10-CM | POA: Diagnosis not present

## 2020-02-07 DIAGNOSIS — D631 Anemia in chronic kidney disease: Secondary | ICD-10-CM | POA: Diagnosis not present

## 2020-02-07 DIAGNOSIS — N186 End stage renal disease: Secondary | ICD-10-CM | POA: Diagnosis not present

## 2020-02-07 DIAGNOSIS — E875 Hyperkalemia: Secondary | ICD-10-CM | POA: Diagnosis not present

## 2020-02-07 DIAGNOSIS — Z992 Dependence on renal dialysis: Secondary | ICD-10-CM | POA: Diagnosis not present

## 2020-02-07 DIAGNOSIS — N2581 Secondary hyperparathyroidism of renal origin: Secondary | ICD-10-CM | POA: Diagnosis not present

## 2020-02-09 DIAGNOSIS — E875 Hyperkalemia: Secondary | ICD-10-CM | POA: Diagnosis not present

## 2020-02-09 DIAGNOSIS — D631 Anemia in chronic kidney disease: Secondary | ICD-10-CM | POA: Diagnosis not present

## 2020-02-09 DIAGNOSIS — N2581 Secondary hyperparathyroidism of renal origin: Secondary | ICD-10-CM | POA: Diagnosis not present

## 2020-02-09 DIAGNOSIS — Z992 Dependence on renal dialysis: Secondary | ICD-10-CM | POA: Diagnosis not present

## 2020-02-09 DIAGNOSIS — N186 End stage renal disease: Secondary | ICD-10-CM | POA: Diagnosis not present

## 2020-02-11 ENCOUNTER — Ambulatory Visit: Payer: Self-pay

## 2020-02-11 ENCOUNTER — Telehealth: Payer: Self-pay

## 2020-02-11 DIAGNOSIS — I10 Essential (primary) hypertension: Secondary | ICD-10-CM

## 2020-02-11 DIAGNOSIS — N186 End stage renal disease: Secondary | ICD-10-CM | POA: Diagnosis not present

## 2020-02-11 DIAGNOSIS — Z992 Dependence on renal dialysis: Secondary | ICD-10-CM | POA: Diagnosis not present

## 2020-02-11 DIAGNOSIS — D631 Anemia in chronic kidney disease: Secondary | ICD-10-CM | POA: Diagnosis not present

## 2020-02-11 DIAGNOSIS — I5032 Chronic diastolic (congestive) heart failure: Secondary | ICD-10-CM

## 2020-02-11 DIAGNOSIS — E875 Hyperkalemia: Secondary | ICD-10-CM | POA: Diagnosis not present

## 2020-02-11 DIAGNOSIS — N2581 Secondary hyperparathyroidism of renal origin: Secondary | ICD-10-CM | POA: Diagnosis not present

## 2020-02-14 ENCOUNTER — Other Ambulatory Visit: Payer: Self-pay

## 2020-02-14 ENCOUNTER — Telehealth: Payer: Medicare Other

## 2020-02-14 DIAGNOSIS — N186 End stage renal disease: Secondary | ICD-10-CM | POA: Diagnosis not present

## 2020-02-14 DIAGNOSIS — E875 Hyperkalemia: Secondary | ICD-10-CM | POA: Diagnosis not present

## 2020-02-14 DIAGNOSIS — D631 Anemia in chronic kidney disease: Secondary | ICD-10-CM | POA: Diagnosis not present

## 2020-02-14 DIAGNOSIS — Z992 Dependence on renal dialysis: Secondary | ICD-10-CM | POA: Diagnosis not present

## 2020-02-14 DIAGNOSIS — N2581 Secondary hyperparathyroidism of renal origin: Secondary | ICD-10-CM | POA: Diagnosis not present

## 2020-02-14 NOTE — Chronic Care Management (AMB) (Signed)
  Chronic Care Management   Outreach Note  02/14/2020 Name: Whitney Walker MRN: [id number] DOB: 09/01/1956  Referred by: [name], FNP Reason for referral : Chronic Care Management (F/U telephone call - ESRD, HTN, CHF)   An unsuccessful telephone outreach was attempted today. The patient was referred to the case management team for assistance with care management and care coordination.   Follow Up Plan: A HIPPA compliant phone message was left for the patient providing contact information and requesting a return call.  Telephone follow up appointment with care management team member scheduled for: 02/17/20  [name], RN, BSN, CCM Care Management Coordinator [place] [facility]  Direct Phone: [phone]

## 2020-02-16 DIAGNOSIS — E875 Hyperkalemia: Secondary | ICD-10-CM | POA: Diagnosis not present

## 2020-02-16 DIAGNOSIS — N186 End stage renal disease: Secondary | ICD-10-CM | POA: Diagnosis not present

## 2020-02-16 DIAGNOSIS — D631 Anemia in chronic kidney disease: Secondary | ICD-10-CM | POA: Diagnosis not present

## 2020-02-16 DIAGNOSIS — Z992 Dependence on renal dialysis: Secondary | ICD-10-CM | POA: Diagnosis not present

## 2020-02-16 DIAGNOSIS — N2581 Secondary hyperparathyroidism of renal origin: Secondary | ICD-10-CM | POA: Diagnosis not present

## 2020-02-17 ENCOUNTER — Ambulatory Visit: Payer: Self-pay

## 2020-02-17 ENCOUNTER — Telehealth: Payer: Self-pay

## 2020-02-17 DIAGNOSIS — N186 End stage renal disease: Secondary | ICD-10-CM

## 2020-02-17 DIAGNOSIS — I5032 Chronic diastolic (congestive) heart failure: Secondary | ICD-10-CM

## 2020-02-17 DIAGNOSIS — I10 Essential (primary) hypertension: Secondary | ICD-10-CM

## 2020-02-18 ENCOUNTER — Other Ambulatory Visit: Payer: Self-pay

## 2020-02-18 ENCOUNTER — Telehealth: Payer: Medicare Other

## 2020-02-18 DIAGNOSIS — Z992 Dependence on renal dialysis: Secondary | ICD-10-CM | POA: Diagnosis not present

## 2020-02-18 DIAGNOSIS — N186 End stage renal disease: Secondary | ICD-10-CM | POA: Diagnosis not present

## 2020-02-18 DIAGNOSIS — N2581 Secondary hyperparathyroidism of renal origin: Secondary | ICD-10-CM | POA: Diagnosis not present

## 2020-02-18 DIAGNOSIS — E875 Hyperkalemia: Secondary | ICD-10-CM | POA: Diagnosis not present

## 2020-02-18 DIAGNOSIS — D631 Anemia in chronic kidney disease: Secondary | ICD-10-CM | POA: Diagnosis not present

## 2020-02-18 NOTE — Chronic Care Management (AMB) (Signed)
  Chronic Care Management   Outreach Note  02/18/2020 Name: Whitney Walker MRN: [id number] DOB: September 26, 1956  Referred by: [name], FNP Reason for referral : Chronic Care Management (FU call - ESRD, HTN, CHF )   An unsuccessful telephone outreach was attempted today. The patient was referred to the case management team for assistance with care management and care coordination.   Follow Up Plan: Telephone follow up appointment with care management team member scheduled for:02/24/20  [name], RN, BSN, CCM Care Management Coordinator [place] [facility]  Direct Phone: [phone]

## 2020-02-21 DIAGNOSIS — N2581 Secondary hyperparathyroidism of renal origin: Secondary | ICD-10-CM | POA: Diagnosis not present

## 2020-02-21 DIAGNOSIS — E875 Hyperkalemia: Secondary | ICD-10-CM | POA: Diagnosis not present

## 2020-02-21 DIAGNOSIS — N186 End stage renal disease: Secondary | ICD-10-CM | POA: Diagnosis not present

## 2020-02-21 DIAGNOSIS — Z992 Dependence on renal dialysis: Secondary | ICD-10-CM | POA: Diagnosis not present

## 2020-02-21 DIAGNOSIS — D631 Anemia in chronic kidney disease: Secondary | ICD-10-CM | POA: Diagnosis not present

## 2020-02-23 ENCOUNTER — Other Ambulatory Visit: Payer: Medicare Other

## 2020-02-23 DIAGNOSIS — D631 Anemia in chronic kidney disease: Secondary | ICD-10-CM | POA: Diagnosis not present

## 2020-02-23 DIAGNOSIS — N2581 Secondary hyperparathyroidism of renal origin: Secondary | ICD-10-CM | POA: Diagnosis not present

## 2020-02-23 DIAGNOSIS — Z992 Dependence on renal dialysis: Secondary | ICD-10-CM | POA: Diagnosis not present

## 2020-02-23 DIAGNOSIS — E875 Hyperkalemia: Secondary | ICD-10-CM | POA: Diagnosis not present

## 2020-02-23 DIAGNOSIS — N186 End stage renal disease: Secondary | ICD-10-CM | POA: Diagnosis not present

## 2020-02-24 ENCOUNTER — Ambulatory Visit: Payer: Medicare Other | Attending: Internal Medicine

## 2020-02-24 ENCOUNTER — Telehealth: Payer: Self-pay

## 2020-02-24 DIAGNOSIS — Z20822 Contact with and (suspected) exposure to covid-19: Secondary | ICD-10-CM

## 2020-02-25 DIAGNOSIS — D631 Anemia in chronic kidney disease: Secondary | ICD-10-CM | POA: Diagnosis not present

## 2020-02-25 DIAGNOSIS — Z992 Dependence on renal dialysis: Secondary | ICD-10-CM | POA: Diagnosis not present

## 2020-02-25 DIAGNOSIS — N186 End stage renal disease: Secondary | ICD-10-CM | POA: Diagnosis not present

## 2020-02-25 DIAGNOSIS — N2581 Secondary hyperparathyroidism of renal origin: Secondary | ICD-10-CM | POA: Diagnosis not present

## 2020-02-25 DIAGNOSIS — E875 Hyperkalemia: Secondary | ICD-10-CM | POA: Diagnosis not present

## 2020-02-25 LAB — NOVEL CORONAVIRUS, NAA: SARS-CoV-2, NAA: NOT DETECTED

## 2020-02-28 ENCOUNTER — Ambulatory Visit: Payer: Medicare Other | Admitting: Nurse Practitioner

## 2020-02-28 DIAGNOSIS — N186 End stage renal disease: Secondary | ICD-10-CM | POA: Diagnosis not present

## 2020-02-28 DIAGNOSIS — Z23 Encounter for immunization: Secondary | ICD-10-CM | POA: Diagnosis not present

## 2020-02-28 DIAGNOSIS — N2581 Secondary hyperparathyroidism of renal origin: Secondary | ICD-10-CM | POA: Diagnosis not present

## 2020-02-28 DIAGNOSIS — Z992 Dependence on renal dialysis: Secondary | ICD-10-CM | POA: Diagnosis not present

## 2020-02-28 DIAGNOSIS — D509 Iron deficiency anemia, unspecified: Secondary | ICD-10-CM | POA: Diagnosis not present

## 2020-02-28 DIAGNOSIS — D631 Anemia in chronic kidney disease: Secondary | ICD-10-CM | POA: Diagnosis not present

## 2020-02-28 DIAGNOSIS — I129 Hypertensive chronic kidney disease with stage 1 through stage 4 chronic kidney disease, or unspecified chronic kidney disease: Secondary | ICD-10-CM | POA: Diagnosis not present

## 2020-03-01 DIAGNOSIS — D509 Iron deficiency anemia, unspecified: Secondary | ICD-10-CM | POA: Diagnosis not present

## 2020-03-01 DIAGNOSIS — Z992 Dependence on renal dialysis: Secondary | ICD-10-CM | POA: Diagnosis not present

## 2020-03-01 DIAGNOSIS — D631 Anemia in chronic kidney disease: Secondary | ICD-10-CM | POA: Diagnosis not present

## 2020-03-01 DIAGNOSIS — N186 End stage renal disease: Secondary | ICD-10-CM | POA: Diagnosis not present

## 2020-03-01 DIAGNOSIS — Z23 Encounter for immunization: Secondary | ICD-10-CM | POA: Diagnosis not present

## 2020-03-01 DIAGNOSIS — N2581 Secondary hyperparathyroidism of renal origin: Secondary | ICD-10-CM | POA: Diagnosis not present

## 2020-03-03 DIAGNOSIS — Z992 Dependence on renal dialysis: Secondary | ICD-10-CM | POA: Diagnosis not present

## 2020-03-03 DIAGNOSIS — N186 End stage renal disease: Secondary | ICD-10-CM | POA: Diagnosis not present

## 2020-03-06 DIAGNOSIS — Z992 Dependence on renal dialysis: Secondary | ICD-10-CM | POA: Diagnosis not present

## 2020-03-06 DIAGNOSIS — N186 End stage renal disease: Secondary | ICD-10-CM | POA: Diagnosis not present

## 2020-03-08 DIAGNOSIS — Z992 Dependence on renal dialysis: Secondary | ICD-10-CM | POA: Diagnosis not present

## 2020-03-08 DIAGNOSIS — N186 End stage renal disease: Secondary | ICD-10-CM | POA: Diagnosis not present

## 2020-03-10 DIAGNOSIS — D509 Iron deficiency anemia, unspecified: Secondary | ICD-10-CM | POA: Diagnosis not present

## 2020-03-10 DIAGNOSIS — N186 End stage renal disease: Secondary | ICD-10-CM | POA: Diagnosis not present

## 2020-03-10 DIAGNOSIS — Z23 Encounter for immunization: Secondary | ICD-10-CM | POA: Diagnosis not present

## 2020-03-10 DIAGNOSIS — Z992 Dependence on renal dialysis: Secondary | ICD-10-CM | POA: Diagnosis not present

## 2020-03-10 DIAGNOSIS — D631 Anemia in chronic kidney disease: Secondary | ICD-10-CM | POA: Diagnosis not present

## 2020-03-10 DIAGNOSIS — N2581 Secondary hyperparathyroidism of renal origin: Secondary | ICD-10-CM | POA: Diagnosis not present

## 2020-03-13 DIAGNOSIS — Z992 Dependence on renal dialysis: Secondary | ICD-10-CM | POA: Diagnosis not present

## 2020-03-13 DIAGNOSIS — N186 End stage renal disease: Secondary | ICD-10-CM | POA: Diagnosis not present

## 2020-03-13 DIAGNOSIS — D509 Iron deficiency anemia, unspecified: Secondary | ICD-10-CM | POA: Diagnosis not present

## 2020-03-13 DIAGNOSIS — N2581 Secondary hyperparathyroidism of renal origin: Secondary | ICD-10-CM | POA: Diagnosis not present

## 2020-03-13 DIAGNOSIS — D631 Anemia in chronic kidney disease: Secondary | ICD-10-CM | POA: Diagnosis not present

## 2020-03-13 DIAGNOSIS — Z23 Encounter for immunization: Secondary | ICD-10-CM | POA: Diagnosis not present

## 2020-03-15 DIAGNOSIS — Z992 Dependence on renal dialysis: Secondary | ICD-10-CM | POA: Diagnosis not present

## 2020-03-15 DIAGNOSIS — N186 End stage renal disease: Secondary | ICD-10-CM | POA: Diagnosis not present

## 2020-03-15 DIAGNOSIS — Z23 Encounter for immunization: Secondary | ICD-10-CM | POA: Diagnosis not present

## 2020-03-15 DIAGNOSIS — D631 Anemia in chronic kidney disease: Secondary | ICD-10-CM | POA: Diagnosis not present

## 2020-03-15 DIAGNOSIS — D509 Iron deficiency anemia, unspecified: Secondary | ICD-10-CM | POA: Diagnosis not present

## 2020-03-15 DIAGNOSIS — N2581 Secondary hyperparathyroidism of renal origin: Secondary | ICD-10-CM | POA: Diagnosis not present

## 2020-03-17 DIAGNOSIS — N186 End stage renal disease: Secondary | ICD-10-CM | POA: Diagnosis not present

## 2020-03-17 DIAGNOSIS — N2581 Secondary hyperparathyroidism of renal origin: Secondary | ICD-10-CM | POA: Diagnosis not present

## 2020-03-17 DIAGNOSIS — Z992 Dependence on renal dialysis: Secondary | ICD-10-CM | POA: Diagnosis not present

## 2020-03-17 DIAGNOSIS — D509 Iron deficiency anemia, unspecified: Secondary | ICD-10-CM | POA: Diagnosis not present

## 2020-03-17 DIAGNOSIS — D631 Anemia in chronic kidney disease: Secondary | ICD-10-CM | POA: Diagnosis not present

## 2020-03-17 DIAGNOSIS — Z23 Encounter for immunization: Secondary | ICD-10-CM | POA: Diagnosis not present

## 2020-03-20 DIAGNOSIS — D509 Iron deficiency anemia, unspecified: Secondary | ICD-10-CM | POA: Diagnosis not present

## 2020-03-20 DIAGNOSIS — D631 Anemia in chronic kidney disease: Secondary | ICD-10-CM | POA: Diagnosis not present

## 2020-03-20 DIAGNOSIS — Z992 Dependence on renal dialysis: Secondary | ICD-10-CM | POA: Diagnosis not present

## 2020-03-20 DIAGNOSIS — N186 End stage renal disease: Secondary | ICD-10-CM | POA: Diagnosis not present

## 2020-03-20 DIAGNOSIS — N2581 Secondary hyperparathyroidism of renal origin: Secondary | ICD-10-CM | POA: Diagnosis not present

## 2020-03-20 DIAGNOSIS — Z23 Encounter for immunization: Secondary | ICD-10-CM | POA: Diagnosis not present

## 2020-03-22 DIAGNOSIS — Z23 Encounter for immunization: Secondary | ICD-10-CM | POA: Diagnosis not present

## 2020-03-22 DIAGNOSIS — D631 Anemia in chronic kidney disease: Secondary | ICD-10-CM | POA: Diagnosis not present

## 2020-03-22 DIAGNOSIS — D509 Iron deficiency anemia, unspecified: Secondary | ICD-10-CM | POA: Diagnosis not present

## 2020-03-22 DIAGNOSIS — N186 End stage renal disease: Secondary | ICD-10-CM | POA: Diagnosis not present

## 2020-03-22 DIAGNOSIS — Z992 Dependence on renal dialysis: Secondary | ICD-10-CM | POA: Diagnosis not present

## 2020-03-22 DIAGNOSIS — N2581 Secondary hyperparathyroidism of renal origin: Secondary | ICD-10-CM | POA: Diagnosis not present

## 2020-03-24 DIAGNOSIS — Z992 Dependence on renal dialysis: Secondary | ICD-10-CM | POA: Diagnosis not present

## 2020-03-24 DIAGNOSIS — D631 Anemia in chronic kidney disease: Secondary | ICD-10-CM | POA: Diagnosis not present

## 2020-03-24 DIAGNOSIS — D509 Iron deficiency anemia, unspecified: Secondary | ICD-10-CM | POA: Diagnosis not present

## 2020-03-24 DIAGNOSIS — Z23 Encounter for immunization: Secondary | ICD-10-CM | POA: Diagnosis not present

## 2020-03-24 DIAGNOSIS — N186 End stage renal disease: Secondary | ICD-10-CM | POA: Diagnosis not present

## 2020-03-24 DIAGNOSIS — N2581 Secondary hyperparathyroidism of renal origin: Secondary | ICD-10-CM | POA: Diagnosis not present

## 2020-03-27 DIAGNOSIS — N186 End stage renal disease: Secondary | ICD-10-CM | POA: Diagnosis not present

## 2020-03-27 DIAGNOSIS — N2581 Secondary hyperparathyroidism of renal origin: Secondary | ICD-10-CM | POA: Diagnosis not present

## 2020-03-27 DIAGNOSIS — D509 Iron deficiency anemia, unspecified: Secondary | ICD-10-CM | POA: Diagnosis not present

## 2020-03-27 DIAGNOSIS — Z992 Dependence on renal dialysis: Secondary | ICD-10-CM | POA: Diagnosis not present

## 2020-03-27 DIAGNOSIS — Z23 Encounter for immunization: Secondary | ICD-10-CM | POA: Diagnosis not present

## 2020-03-27 DIAGNOSIS — D631 Anemia in chronic kidney disease: Secondary | ICD-10-CM | POA: Diagnosis not present

## 2020-03-29 DIAGNOSIS — Z23 Encounter for immunization: Secondary | ICD-10-CM | POA: Diagnosis not present

## 2020-03-29 DIAGNOSIS — D509 Iron deficiency anemia, unspecified: Secondary | ICD-10-CM | POA: Diagnosis not present

## 2020-03-29 DIAGNOSIS — N186 End stage renal disease: Secondary | ICD-10-CM | POA: Diagnosis not present

## 2020-03-29 DIAGNOSIS — N2581 Secondary hyperparathyroidism of renal origin: Secondary | ICD-10-CM | POA: Diagnosis not present

## 2020-03-29 DIAGNOSIS — D631 Anemia in chronic kidney disease: Secondary | ICD-10-CM | POA: Diagnosis not present

## 2020-03-29 DIAGNOSIS — Z992 Dependence on renal dialysis: Secondary | ICD-10-CM | POA: Diagnosis not present

## 2020-03-30 ENCOUNTER — Ambulatory Visit: Payer: Self-pay

## 2020-03-30 ENCOUNTER — Other Ambulatory Visit: Payer: Self-pay

## 2020-03-30 ENCOUNTER — Telehealth: Payer: Medicare Other

## 2020-03-30 DIAGNOSIS — I5032 Chronic diastolic (congestive) heart failure: Secondary | ICD-10-CM

## 2020-03-30 DIAGNOSIS — N186 End stage renal disease: Secondary | ICD-10-CM

## 2020-03-30 DIAGNOSIS — I129 Hypertensive chronic kidney disease with stage 1 through stage 4 chronic kidney disease, or unspecified chronic kidney disease: Secondary | ICD-10-CM | POA: Diagnosis not present

## 2020-03-30 DIAGNOSIS — Z992 Dependence on renal dialysis: Secondary | ICD-10-CM | POA: Diagnosis not present

## 2020-03-30 DIAGNOSIS — I10 Essential (primary) hypertension: Secondary | ICD-10-CM

## 2020-03-31 DIAGNOSIS — D509 Iron deficiency anemia, unspecified: Secondary | ICD-10-CM | POA: Diagnosis not present

## 2020-03-31 DIAGNOSIS — N2581 Secondary hyperparathyroidism of renal origin: Secondary | ICD-10-CM | POA: Diagnosis not present

## 2020-03-31 DIAGNOSIS — Z992 Dependence on renal dialysis: Secondary | ICD-10-CM | POA: Diagnosis not present

## 2020-03-31 DIAGNOSIS — N186 End stage renal disease: Secondary | ICD-10-CM | POA: Diagnosis not present

## 2020-03-31 DIAGNOSIS — D631 Anemia in chronic kidney disease: Secondary | ICD-10-CM | POA: Diagnosis not present

## 2020-03-31 DIAGNOSIS — Z23 Encounter for immunization: Secondary | ICD-10-CM | POA: Diagnosis not present

## 2020-04-03 DIAGNOSIS — D509 Iron deficiency anemia, unspecified: Secondary | ICD-10-CM | POA: Diagnosis not present

## 2020-04-03 DIAGNOSIS — N2581 Secondary hyperparathyroidism of renal origin: Secondary | ICD-10-CM | POA: Diagnosis not present

## 2020-04-03 DIAGNOSIS — D631 Anemia in chronic kidney disease: Secondary | ICD-10-CM | POA: Diagnosis not present

## 2020-04-03 DIAGNOSIS — Z23 Encounter for immunization: Secondary | ICD-10-CM | POA: Diagnosis not present

## 2020-04-03 DIAGNOSIS — Z992 Dependence on renal dialysis: Secondary | ICD-10-CM | POA: Diagnosis not present

## 2020-04-03 DIAGNOSIS — N186 End stage renal disease: Secondary | ICD-10-CM | POA: Diagnosis not present

## 2020-04-03 NOTE — Chronic Care Management (AMB) (Signed)
  Chronic Care Management   Outreach Note  04/03/2020 Name: Whitney Walker MRN: [id number] DOB: [birth year]  Referred by: [name], FNP Reason for referral : Chronic Care Management (FU RN Call)   An unsuccessful telephone outreach was attempted today. The patient was referred to the case management team for assistance with care management and care coordination.   Follow Up Plan: A HIPPA compliant phone message was left for the patient providing contact information and requesting a return call.  Telephone follow up appointment with care management team member scheduled for: 04/06/20  [name], RN, BSN, CCM Care Management Coordinator [place] [facility]  Direct Phone: [phone]

## 2020-04-05 DIAGNOSIS — Z23 Encounter for immunization: Secondary | ICD-10-CM | POA: Diagnosis not present

## 2020-04-05 DIAGNOSIS — N186 End stage renal disease: Secondary | ICD-10-CM | POA: Diagnosis not present

## 2020-04-05 DIAGNOSIS — D631 Anemia in chronic kidney disease: Secondary | ICD-10-CM | POA: Diagnosis not present

## 2020-04-05 DIAGNOSIS — Z992 Dependence on renal dialysis: Secondary | ICD-10-CM | POA: Diagnosis not present

## 2020-04-05 DIAGNOSIS — D509 Iron deficiency anemia, unspecified: Secondary | ICD-10-CM | POA: Diagnosis not present

## 2020-04-05 DIAGNOSIS — N2581 Secondary hyperparathyroidism of renal origin: Secondary | ICD-10-CM | POA: Diagnosis not present

## 2020-04-06 ENCOUNTER — Other Ambulatory Visit: Payer: Self-pay

## 2020-04-06 ENCOUNTER — Ambulatory Visit: Payer: Self-pay

## 2020-04-06 ENCOUNTER — Telehealth: Payer: Medicare Other

## 2020-04-06 DIAGNOSIS — I5032 Chronic diastolic (congestive) heart failure: Secondary | ICD-10-CM

## 2020-04-06 DIAGNOSIS — I10 Essential (primary) hypertension: Secondary | ICD-10-CM

## 2020-04-06 DIAGNOSIS — N186 End stage renal disease: Secondary | ICD-10-CM

## 2020-04-06 NOTE — Chronic Care Management (AMB) (Addendum)
  Chronic Care Management   Outreach Note  04/06/2020 Name: Whitney Walker MRN: [id number] DOB: 09-04-1956  Referred by: [name], FNP Reason for referral : Chronic Care Management (FU RN Call )   A second unsuccessful telephone outreach was attempted today. The patient was referred to the case management team for assistance with care management and care coordination.   Follow Up Plan: Telephone follow up appointment with care management team member scheduled for: 04/25/20  [name], RN, BSN, CCM Care Management Coordinator [place] [facility]  Direct Phone: [phone]

## 2020-04-07 DIAGNOSIS — N186 End stage renal disease: Secondary | ICD-10-CM | POA: Diagnosis not present

## 2020-04-07 DIAGNOSIS — D509 Iron deficiency anemia, unspecified: Secondary | ICD-10-CM | POA: Diagnosis not present

## 2020-04-07 DIAGNOSIS — D631 Anemia in chronic kidney disease: Secondary | ICD-10-CM | POA: Diagnosis not present

## 2020-04-07 DIAGNOSIS — Z23 Encounter for immunization: Secondary | ICD-10-CM | POA: Diagnosis not present

## 2020-04-07 DIAGNOSIS — N2581 Secondary hyperparathyroidism of renal origin: Secondary | ICD-10-CM | POA: Diagnosis not present

## 2020-04-07 DIAGNOSIS — Z992 Dependence on renal dialysis: Secondary | ICD-10-CM | POA: Diagnosis not present

## 2020-04-10 DIAGNOSIS — D631 Anemia in chronic kidney disease: Secondary | ICD-10-CM | POA: Diagnosis not present

## 2020-04-10 DIAGNOSIS — Z992 Dependence on renal dialysis: Secondary | ICD-10-CM | POA: Diagnosis not present

## 2020-04-10 DIAGNOSIS — D509 Iron deficiency anemia, unspecified: Secondary | ICD-10-CM | POA: Diagnosis not present

## 2020-04-10 DIAGNOSIS — N186 End stage renal disease: Secondary | ICD-10-CM | POA: Diagnosis not present

## 2020-04-10 DIAGNOSIS — Z23 Encounter for immunization: Secondary | ICD-10-CM | POA: Diagnosis not present

## 2020-04-10 DIAGNOSIS — N2581 Secondary hyperparathyroidism of renal origin: Secondary | ICD-10-CM | POA: Diagnosis not present

## 2020-04-11 ENCOUNTER — Other Ambulatory Visit: Payer: Self-pay | Admitting: Hematology

## 2020-04-11 DIAGNOSIS — I82412 Acute embolism and thrombosis of left femoral vein: Secondary | ICD-10-CM

## 2020-04-12 DIAGNOSIS — D631 Anemia in chronic kidney disease: Secondary | ICD-10-CM | POA: Diagnosis not present

## 2020-04-12 DIAGNOSIS — D509 Iron deficiency anemia, unspecified: Secondary | ICD-10-CM | POA: Diagnosis not present

## 2020-04-12 DIAGNOSIS — N186 End stage renal disease: Secondary | ICD-10-CM | POA: Diagnosis not present

## 2020-04-12 DIAGNOSIS — Z992 Dependence on renal dialysis: Secondary | ICD-10-CM | POA: Diagnosis not present

## 2020-04-12 DIAGNOSIS — N2581 Secondary hyperparathyroidism of renal origin: Secondary | ICD-10-CM | POA: Diagnosis not present

## 2020-04-12 DIAGNOSIS — Z23 Encounter for immunization: Secondary | ICD-10-CM | POA: Diagnosis not present

## 2020-04-14 DIAGNOSIS — N2581 Secondary hyperparathyroidism of renal origin: Secondary | ICD-10-CM | POA: Diagnosis not present

## 2020-04-14 DIAGNOSIS — N186 End stage renal disease: Secondary | ICD-10-CM | POA: Diagnosis not present

## 2020-04-14 DIAGNOSIS — D509 Iron deficiency anemia, unspecified: Secondary | ICD-10-CM | POA: Diagnosis not present

## 2020-04-14 DIAGNOSIS — Z23 Encounter for immunization: Secondary | ICD-10-CM | POA: Diagnosis not present

## 2020-04-14 DIAGNOSIS — Z992 Dependence on renal dialysis: Secondary | ICD-10-CM | POA: Diagnosis not present

## 2020-04-14 DIAGNOSIS — D631 Anemia in chronic kidney disease: Secondary | ICD-10-CM | POA: Diagnosis not present

## 2020-04-17 DIAGNOSIS — D509 Iron deficiency anemia, unspecified: Secondary | ICD-10-CM | POA: Diagnosis not present

## 2020-04-17 DIAGNOSIS — N186 End stage renal disease: Secondary | ICD-10-CM | POA: Diagnosis not present

## 2020-04-17 DIAGNOSIS — Z23 Encounter for immunization: Secondary | ICD-10-CM | POA: Diagnosis not present

## 2020-04-17 DIAGNOSIS — Z992 Dependence on renal dialysis: Secondary | ICD-10-CM | POA: Diagnosis not present

## 2020-04-17 DIAGNOSIS — N2581 Secondary hyperparathyroidism of renal origin: Secondary | ICD-10-CM | POA: Diagnosis not present

## 2020-04-17 DIAGNOSIS — D631 Anemia in chronic kidney disease: Secondary | ICD-10-CM | POA: Diagnosis not present

## 2020-04-19 DIAGNOSIS — Z23 Encounter for immunization: Secondary | ICD-10-CM | POA: Diagnosis not present

## 2020-04-19 DIAGNOSIS — D509 Iron deficiency anemia, unspecified: Secondary | ICD-10-CM | POA: Diagnosis not present

## 2020-04-19 DIAGNOSIS — N2581 Secondary hyperparathyroidism of renal origin: Secondary | ICD-10-CM | POA: Diagnosis not present

## 2020-04-19 DIAGNOSIS — Z992 Dependence on renal dialysis: Secondary | ICD-10-CM | POA: Diagnosis not present

## 2020-04-19 DIAGNOSIS — D631 Anemia in chronic kidney disease: Secondary | ICD-10-CM | POA: Diagnosis not present

## 2020-04-19 DIAGNOSIS — N186 End stage renal disease: Secondary | ICD-10-CM | POA: Diagnosis not present

## 2020-04-21 DIAGNOSIS — N186 End stage renal disease: Secondary | ICD-10-CM | POA: Diagnosis not present

## 2020-04-21 DIAGNOSIS — N2581 Secondary hyperparathyroidism of renal origin: Secondary | ICD-10-CM | POA: Diagnosis not present

## 2020-04-21 DIAGNOSIS — Z992 Dependence on renal dialysis: Secondary | ICD-10-CM | POA: Diagnosis not present

## 2020-04-21 DIAGNOSIS — D509 Iron deficiency anemia, unspecified: Secondary | ICD-10-CM | POA: Diagnosis not present

## 2020-04-21 DIAGNOSIS — Z23 Encounter for immunization: Secondary | ICD-10-CM | POA: Diagnosis not present

## 2020-04-21 DIAGNOSIS — D631 Anemia in chronic kidney disease: Secondary | ICD-10-CM | POA: Diagnosis not present

## 2020-04-24 DIAGNOSIS — D631 Anemia in chronic kidney disease: Secondary | ICD-10-CM | POA: Diagnosis not present

## 2020-04-24 DIAGNOSIS — N2581 Secondary hyperparathyroidism of renal origin: Secondary | ICD-10-CM | POA: Diagnosis not present

## 2020-04-24 DIAGNOSIS — D509 Iron deficiency anemia, unspecified: Secondary | ICD-10-CM | POA: Diagnosis not present

## 2020-04-24 DIAGNOSIS — Z23 Encounter for immunization: Secondary | ICD-10-CM | POA: Diagnosis not present

## 2020-04-24 DIAGNOSIS — N186 End stage renal disease: Secondary | ICD-10-CM | POA: Diagnosis not present

## 2020-04-24 DIAGNOSIS — Z992 Dependence on renal dialysis: Secondary | ICD-10-CM | POA: Diagnosis not present

## 2020-04-25 ENCOUNTER — Telehealth: Payer: Self-pay

## 2020-04-26 ENCOUNTER — Telehealth: Payer: Medicare Other

## 2020-04-26 ENCOUNTER — Other Ambulatory Visit: Payer: Self-pay

## 2020-04-26 ENCOUNTER — Ambulatory Visit (INDEPENDENT_AMBULATORY_CARE_PROVIDER_SITE_OTHER): Payer: Medicare Other

## 2020-04-26 DIAGNOSIS — N186 End stage renal disease: Secondary | ICD-10-CM | POA: Diagnosis not present

## 2020-04-26 DIAGNOSIS — Z992 Dependence on renal dialysis: Secondary | ICD-10-CM | POA: Diagnosis not present

## 2020-04-26 DIAGNOSIS — D509 Iron deficiency anemia, unspecified: Secondary | ICD-10-CM | POA: Diagnosis not present

## 2020-04-26 DIAGNOSIS — I10 Essential (primary) hypertension: Secondary | ICD-10-CM | POA: Diagnosis not present

## 2020-04-26 DIAGNOSIS — D631 Anemia in chronic kidney disease: Secondary | ICD-10-CM | POA: Diagnosis not present

## 2020-04-26 DIAGNOSIS — N2581 Secondary hyperparathyroidism of renal origin: Secondary | ICD-10-CM | POA: Diagnosis not present

## 2020-04-26 DIAGNOSIS — I5032 Chronic diastolic (congestive) heart failure: Secondary | ICD-10-CM | POA: Diagnosis not present

## 2020-04-26 DIAGNOSIS — Z23 Encounter for immunization: Secondary | ICD-10-CM | POA: Diagnosis not present

## 2020-04-27 NOTE — Chronic Care Management (AMB) (Signed)
  Chronic Care Management   Outreach Note  04/27/2020 Name: Whitney Walker MRN: [id number] DOB: [birth year]  Referred by: [name], FNP Reason for referral : Chronic Care Management (FU RN Call - CHF/ESRD on HD )   Third unsuccessful telephone outreach was attempted today. The patient was referred to the case management team for assistance with care management and care coordination. The patient's primary care provider has been notified of our unsuccessful attempts to make or maintain contact with the patient. The care management team is pleased to engage with this patient at any time in the future should he/she be interested in assistance from the care management team.   Follow Up Plan: A HIPPA compliant phone message was left for the patient providing contact information and requesting a return call.  The care management team is available to follow up with the patient after provider conversation with the patient regarding recommendation for care management engagement and subsequent re-referral to the care management team.   [name], RN, BSN, CCM Care Management Coordinator [place] [facility]  Direct Phone: [phone]

## 2020-04-28 DIAGNOSIS — D509 Iron deficiency anemia, unspecified: Secondary | ICD-10-CM | POA: Diagnosis not present

## 2020-04-28 DIAGNOSIS — N2581 Secondary hyperparathyroidism of renal origin: Secondary | ICD-10-CM | POA: Diagnosis not present

## 2020-04-28 DIAGNOSIS — Z23 Encounter for immunization: Secondary | ICD-10-CM | POA: Diagnosis not present

## 2020-04-28 DIAGNOSIS — Z992 Dependence on renal dialysis: Secondary | ICD-10-CM | POA: Diagnosis not present

## 2020-04-28 DIAGNOSIS — N186 End stage renal disease: Secondary | ICD-10-CM | POA: Diagnosis not present

## 2020-04-28 DIAGNOSIS — D631 Anemia in chronic kidney disease: Secondary | ICD-10-CM | POA: Diagnosis not present

## 2020-05-01 DIAGNOSIS — D689 Coagulation defect, unspecified: Secondary | ICD-10-CM | POA: Diagnosis not present

## 2020-05-01 DIAGNOSIS — Z23 Encounter for immunization: Secondary | ICD-10-CM | POA: Diagnosis not present

## 2020-05-01 DIAGNOSIS — N2581 Secondary hyperparathyroidism of renal origin: Secondary | ICD-10-CM | POA: Diagnosis not present

## 2020-05-01 DIAGNOSIS — D631 Anemia in chronic kidney disease: Secondary | ICD-10-CM | POA: Diagnosis not present

## 2020-05-01 DIAGNOSIS — N186 End stage renal disease: Secondary | ICD-10-CM | POA: Diagnosis not present

## 2020-05-01 DIAGNOSIS — Z992 Dependence on renal dialysis: Secondary | ICD-10-CM | POA: Diagnosis not present

## 2020-05-01 DIAGNOSIS — D509 Iron deficiency anemia, unspecified: Secondary | ICD-10-CM | POA: Diagnosis not present

## 2020-05-03 DIAGNOSIS — D689 Coagulation defect, unspecified: Secondary | ICD-10-CM | POA: Diagnosis not present

## 2020-05-03 DIAGNOSIS — Z23 Encounter for immunization: Secondary | ICD-10-CM | POA: Diagnosis not present

## 2020-05-03 DIAGNOSIS — N186 End stage renal disease: Secondary | ICD-10-CM | POA: Diagnosis not present

## 2020-05-03 DIAGNOSIS — D631 Anemia in chronic kidney disease: Secondary | ICD-10-CM | POA: Diagnosis not present

## 2020-05-03 DIAGNOSIS — N2581 Secondary hyperparathyroidism of renal origin: Secondary | ICD-10-CM | POA: Diagnosis not present

## 2020-05-03 DIAGNOSIS — D509 Iron deficiency anemia, unspecified: Secondary | ICD-10-CM | POA: Diagnosis not present

## 2020-05-03 DIAGNOSIS — Z992 Dependence on renal dialysis: Secondary | ICD-10-CM | POA: Diagnosis not present

## 2020-05-04 ENCOUNTER — Other Ambulatory Visit: Payer: Self-pay

## 2020-05-04 ENCOUNTER — Ambulatory Visit (INDEPENDENT_AMBULATORY_CARE_PROVIDER_SITE_OTHER): Payer: Medicare Other | Admitting: Nurse Practitioner

## 2020-05-04 ENCOUNTER — Encounter: Payer: Self-pay | Admitting: Nurse Practitioner

## 2020-05-04 VITALS — BP 134/82 | HR 74 | Temp 98.1°F | Ht 61.4 in | Wt 201.0 lb

## 2020-05-04 DIAGNOSIS — G4700 Insomnia, unspecified: Secondary | ICD-10-CM | POA: Diagnosis not present

## 2020-05-04 DIAGNOSIS — I10 Essential (primary) hypertension: Secondary | ICD-10-CM

## 2020-05-04 MED ORDER — TEMAZEPAM 15 MG PO CAPS: 15.0000 mg | ORAL_CAPSULE | Freq: Every evening | ORAL | 2 refills | Status: DC | PRN

## 2020-05-04 NOTE — Progress Notes (Signed)
This visit occurred during the SARS-CoV-2 public health emergency.  Safety protocols were in place, including screening questions prior to the visit, additional usage of staff PPE, and extensive cleaning of exam room while observing appropriate contact time as indicated for disinfecting solutions.  Subjective:     Patient ID: Whitney Walker , female    DOB: July 25, 1956 , 64 y.o.   MRN: [id number]   Chief Complaint  Patient presents with  . Hypertension    HPI  She is having fluctuations in her blood pressure.  She is seeing Dr. [name]. She reports the blood pressure has been as high as 119'E RDEYCXKG/818'H diastolic.   Hypertension This is a chronic problem. The current episode started more than 1 year ago. The problem is controlled. Pertinent negatives include no anxiety, chest pain, headaches or palpitations. There are no associated agents to hypertension. Risk factors for coronary artery disease include obesity and sedentary lifestyle. There are no compliance problems.  There is no history of angina. Identifiable causes of hypertension include chronic renal disease.     Past Medical History:  Diagnosis Date  . Anemia   . Anxiety   . CHF (congestive heart failure) ([place])   . Colon cancer [facility])    treatment surgery  . Complication of anesthesia    after first C- [place] "couldnt walk after", patient denies having a spinal  . COPD (chronic obstructive pulmonary disease) ([place])   . Coronary artery disease   . Depression   . DVT (deep venous thrombosis) ([place])   . ESRD (end stage renal disease) ([place])    Hemo: MWF  . History of blood transfusion 04/2018  . Hypertension    07/07/18- no longer takes BP medications  . Meningitis   . Pain in limb 07/30/2013  . PE (pulmonary embolism)   . Peripheral vascular disease ([place])   . Restless legs   . Shortness of breath    with exertion  . Sleep apnea   . SOB (shortness of breath) 03/03/2019  . Vertigo      Family History  Problem  Relation Age of Onset  . Cancer Mother 2       breast and bone  . Cancer Father 8       prostate  . Hypertension Sister   . Bleeding Disorder Sister   . Cancer Cousin 20       breast cancer   . Hypertension Daughter   . Breast cancer Neg Hx      Current Outpatient Medications:  .  acetaminophen (TYLENOL) 500 MG tablet, Take 1,000 mg by mouth 2 (two) times daily as needed for moderate pain or headache., Disp: , Rfl:  .  albuterol (PROVENTIL HFA;VENTOLIN HFA) 108 (90 Base) MCG/ACT inhaler, Inhale 2 puffs into the lungs every 6 (six) hours as needed for wheezing or shortness of breath. , Disp: , Rfl:  .  Biotin 10000 MCG TABS, Take by mouth., Disp: , Rfl:  .  calcium citrate (CALCITRATE - DOSED IN MG ELEMENTAL CALCIUM) 950 MG tablet, Take 200 mg of elemental calcium by mouth 3 (three) times daily. 2 tabs with dialysis on M,W,F, Disp: , Rfl:  .  carvedilol (COREG) 6.25 MG tablet, Take 12.5 mg by mouth 2 (two) times daily with a meal. Only takes once a day on dialysis days (holds in morning on Monday Wednesday and Friday) , Disp: , Rfl:  .  ELIQUIS 2.5 MG TABS tablet, TAKE ONE TABLET BY MOUTH TWICE A DAY, Disp: 60  tablet, Rfl: 0 .  meclizine (ANTIVERT) 12.5 MG tablet, Take 12.5 mg by mouth 3 (three) times daily as needed for dizziness., Disp: , Rfl:  .  mometasone (ELOCON) 0.1 % cream, Apply to affected area daily, Disp: 45 g, Rfl: 1 .  multivitamin (RENA-VIT) TABS tablet, Take 1 tablet by mouth at bedtime., Disp: , Rfl:  .  mupirocin ointment (BACTROBAN) 2 %, Apply 1 application topically daily as needed (skin bumps)., Disp: 22 g, Rfl: 2 .  Nutritional Supplements (FEEDING SUPPLEMENT, NEPRO CARB STEADY,) LIQD, Take 237 mLs by mouth 3 (three) times a week. During dialysis, Monday, Wednesday & Friday, Disp: , Rfl:  .  nystatin (NYSTATIN) powder, Apply topically 4 (four) times daily., Disp: 15 g, Rfl: 0 .  promethazine (PHENERGAN) 12.5 MG tablet, Take 12.5 mg by mouth every 6 (six) hours as  needed for nausea or vomiting., Disp: , Rfl:  .  sevelamer carbonate (RENVELA) 800 MG tablet, Take 4 tablets with each meal, Disp: , Rfl:  .  AURYXIA 1 GM 210 MG(Fe) tablet, , Disp: , Rfl:  .  traZODone (DESYREL) 50 MG tablet, Take 1 tablet (50 mg total) by mouth at bedtime as needed for sleep. (Patient not taking: Reported on 05/04/2020), Disp: 30 tablet, Rfl: 1   Allergies  Allergen Reactions  . Carnosine     Other reaction(s): Unknown  . Gadolinium Derivatives Hives and Other (See Comments)    HIVES, Desc: HIVES W/ "DYE" USED FOR 1ST CT SCAN BUT NOT 2ND, NO PREMEDS USED, PT UNCERTAIN OF CIRCUMSTANCES,,?POSSIBLE MRI CONTRAST ALLERGY, ALL STUDIES DONE "SOMEWHERE" IN [place]//A.C., Onset Date: [id number]  . Iohexol Other (See Comments)     Code: HIVES, Desc: HIVES W/ "DYE" USED FOR 1ST CT SCAN BUT NOT 2ND, NO PREMEDS USED, PT UNCERTAIN OF CIRCUMSTANCES,,?POSSIBLE MRI CONTRAST ALLERGY, ALL STUDIES DONE "SOMEWHERE" IN [place]//A.C., Onset Date: [id number]   . Iodine Hives  . Naltrexone     Other reaction(s): Unknown     Review of Systems  Constitutional: Negative.   Respiratory: Negative.   Cardiovascular: Negative.  Negative for chest pain, palpitations and leg swelling.  Neurological: Negative for dizziness and headaches.  Psychiatric/Behavioral: Negative.      Today's Vitals   05/04/20 1529  BP: 134/82  Pulse: 74  Temp: 98.1 F (36.7 C)  TempSrc: Oral  SpO2: 94%  Weight: 201 lb (91.2 kg)  Height: 5' 1.4" (1.56 m)   Body mass index is 37.49 kg/m.   Objective:  Physical Exam Constitutional:      Appearance: Normal appearance.  Cardiovascular:     Rate and Rhythm: Normal rate and regular rhythm.     Pulses: Normal pulses.     Heart sounds: Normal heart sounds. No murmur.  Pulmonary:     Effort: Pulmonary effort is normal. No respiratory distress.     Breath sounds: Normal breath sounds.  Skin:    Capillary Refill: Capillary refill takes less than 2 seconds.   Neurological:     General: No focal deficit present.     Mental Status: She is alert and oriented to person, place, and time.  Psychiatric:        Mood and Affect: Mood normal.        Behavior: Behavior normal.        Thought Content: Thought content normal.        Judgment: Judgment normal.         Assessment And Plan:     1. Insomnia, unspecified type Trazodone  is ineffective, will start her on temazepam Return in June for regular appt will follow up on sleep - temazepam (RESTORIL) 15 MG capsule; Take 1 capsule (15 mg total) by mouth at bedtime as needed for sleep.  Dispense: 30 capsule; Refill: 2  2. Essential hypertension  Her blood pressure today is normal. I do not want to make any changes to her medication at this time but will start her on a new sleep aid as this may help with her blood pressure.    [name], FNP    THE PATIENT IS ENCOURAGED TO PRACTICE SOCIAL DISTANCING DUE TO THE COVID-19 PANDEMIC.

## 2020-05-05 DIAGNOSIS — D689 Coagulation defect, unspecified: Secondary | ICD-10-CM | POA: Diagnosis not present

## 2020-05-05 DIAGNOSIS — D509 Iron deficiency anemia, unspecified: Secondary | ICD-10-CM | POA: Diagnosis not present

## 2020-05-05 DIAGNOSIS — Z23 Encounter for immunization: Secondary | ICD-10-CM | POA: Diagnosis not present

## 2020-05-05 DIAGNOSIS — Z992 Dependence on renal dialysis: Secondary | ICD-10-CM | POA: Diagnosis not present

## 2020-05-05 DIAGNOSIS — N186 End stage renal disease: Secondary | ICD-10-CM | POA: Diagnosis not present

## 2020-05-05 DIAGNOSIS — N2581 Secondary hyperparathyroidism of renal origin: Secondary | ICD-10-CM | POA: Diagnosis not present

## 2020-05-05 DIAGNOSIS — D631 Anemia in chronic kidney disease: Secondary | ICD-10-CM | POA: Diagnosis not present

## 2020-05-07 ENCOUNTER — Other Ambulatory Visit: Payer: Self-pay

## 2020-05-07 ENCOUNTER — Encounter (HOSPITAL_COMMUNITY): Payer: Self-pay | Admitting: Emergency Medicine

## 2020-05-07 ENCOUNTER — Emergency Department (HOSPITAL_COMMUNITY): Payer: Medicare Other

## 2020-05-07 ENCOUNTER — Emergency Department (HOSPITAL_COMMUNITY)
Admission: EM | Admit: 2020-05-07 | Discharge: 2020-05-07 | Disposition: A | Discharge status: Home / Self Care | Payer: Medicare Other | Attending: Emergency Medicine | Admitting: Emergency Medicine

## 2020-05-07 DIAGNOSIS — M25561 Pain in right knee: Secondary | ICD-10-CM | POA: Diagnosis not present

## 2020-05-07 DIAGNOSIS — I2510 Atherosclerotic heart disease of native coronary artery without angina pectoris: Secondary | ICD-10-CM | POA: Diagnosis not present

## 2020-05-07 DIAGNOSIS — I130 Hypertensive heart and chronic kidney disease with heart failure and stage 1 through stage 4 chronic kidney disease, or unspecified chronic kidney disease: Secondary | ICD-10-CM | POA: Diagnosis not present

## 2020-05-07 DIAGNOSIS — Z85038 Personal history of other malignant neoplasm of large intestine: Secondary | ICD-10-CM | POA: Insufficient documentation

## 2020-05-07 DIAGNOSIS — S8991XA Unspecified injury of right lower leg, initial encounter: Secondary | ICD-10-CM | POA: Diagnosis not present

## 2020-05-07 DIAGNOSIS — S93402A Sprain of unspecified ligament of left ankle, initial encounter: Secondary | ICD-10-CM | POA: Diagnosis not present

## 2020-05-07 DIAGNOSIS — Z79899 Other long term (current) drug therapy: Secondary | ICD-10-CM | POA: Insufficient documentation

## 2020-05-07 DIAGNOSIS — Y92511 Restaurant or cafe as the place of occurrence of the external cause: Secondary | ICD-10-CM | POA: Diagnosis not present

## 2020-05-07 DIAGNOSIS — N184 Chronic kidney disease, stage 4 (severe): Secondary | ICD-10-CM | POA: Diagnosis not present

## 2020-05-07 DIAGNOSIS — S8992XA Unspecified injury of left lower leg, initial encounter: Secondary | ICD-10-CM | POA: Diagnosis not present

## 2020-05-07 DIAGNOSIS — Y9389 Activity, other specified: Secondary | ICD-10-CM | POA: Insufficient documentation

## 2020-05-07 DIAGNOSIS — I5032 Chronic diastolic (congestive) heart failure: Secondary | ICD-10-CM | POA: Insufficient documentation

## 2020-05-07 DIAGNOSIS — J449 Chronic obstructive pulmonary disease, unspecified: Secondary | ICD-10-CM | POA: Insufficient documentation

## 2020-05-07 DIAGNOSIS — S99912A Unspecified injury of left ankle, initial encounter: Secondary | ICD-10-CM | POA: Diagnosis not present

## 2020-05-07 DIAGNOSIS — W19XXXA Unspecified fall, initial encounter: Secondary | ICD-10-CM

## 2020-05-07 DIAGNOSIS — F17210 Nicotine dependence, cigarettes, uncomplicated: Secondary | ICD-10-CM | POA: Insufficient documentation

## 2020-05-07 DIAGNOSIS — M25562 Pain in left knee: Secondary | ICD-10-CM | POA: Insufficient documentation

## 2020-05-07 DIAGNOSIS — Y999 Unspecified external cause status: Secondary | ICD-10-CM | POA: Insufficient documentation

## 2020-05-07 DIAGNOSIS — W108XXA Fall (on) (from) other stairs and steps, initial encounter: Secondary | ICD-10-CM | POA: Insufficient documentation

## 2020-05-07 IMAGING — CR DG KNEE COMPLETE 4+V*R*
4 series · 4 of 4 positions shown · non-contrast
Comparison: [DATE] [HOSPITAL]

CLINICAL DATA: Trip and fall today. Right knee injury and pain.
Initial encounter.

EXAM:
RIGHT KNEE - COMPLETE 4+ VIEW

[knee obl (1 of 2)]
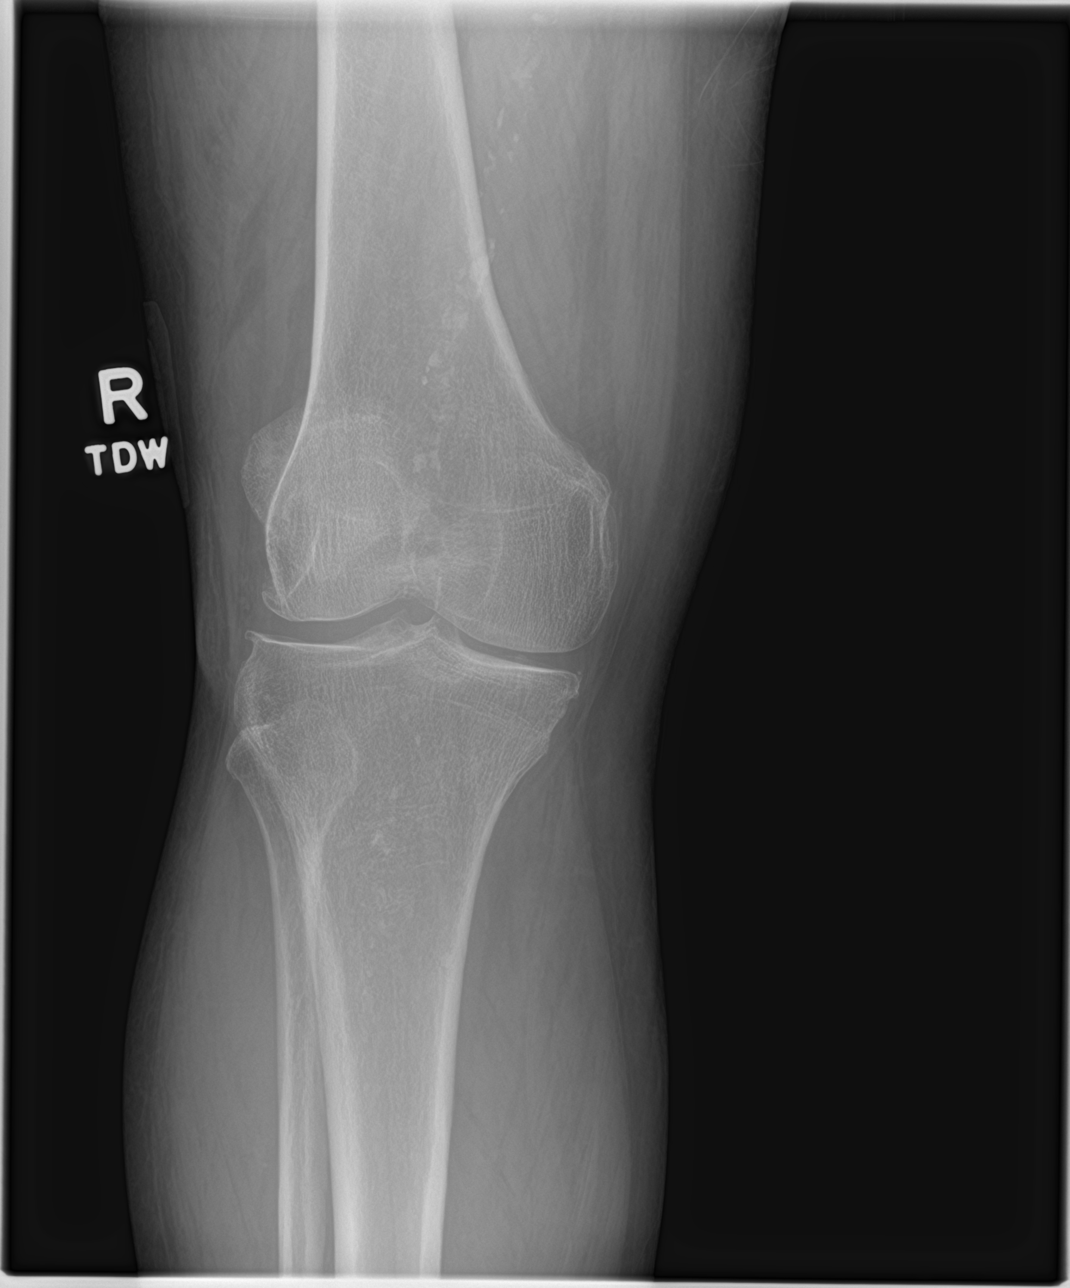

[knee lat]
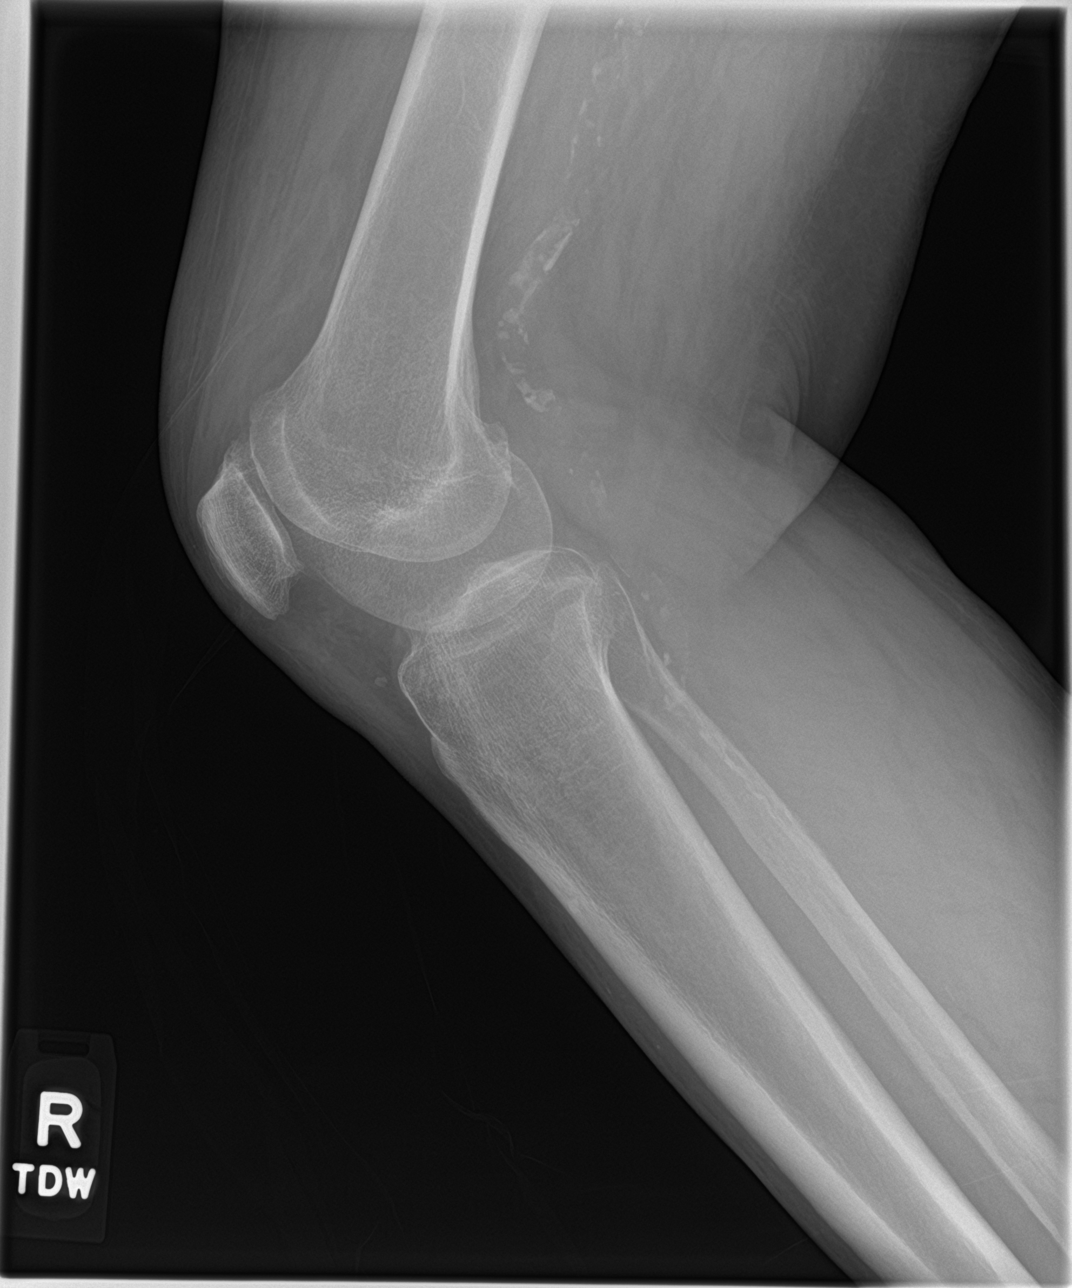

[knee obl (2 of 2)]
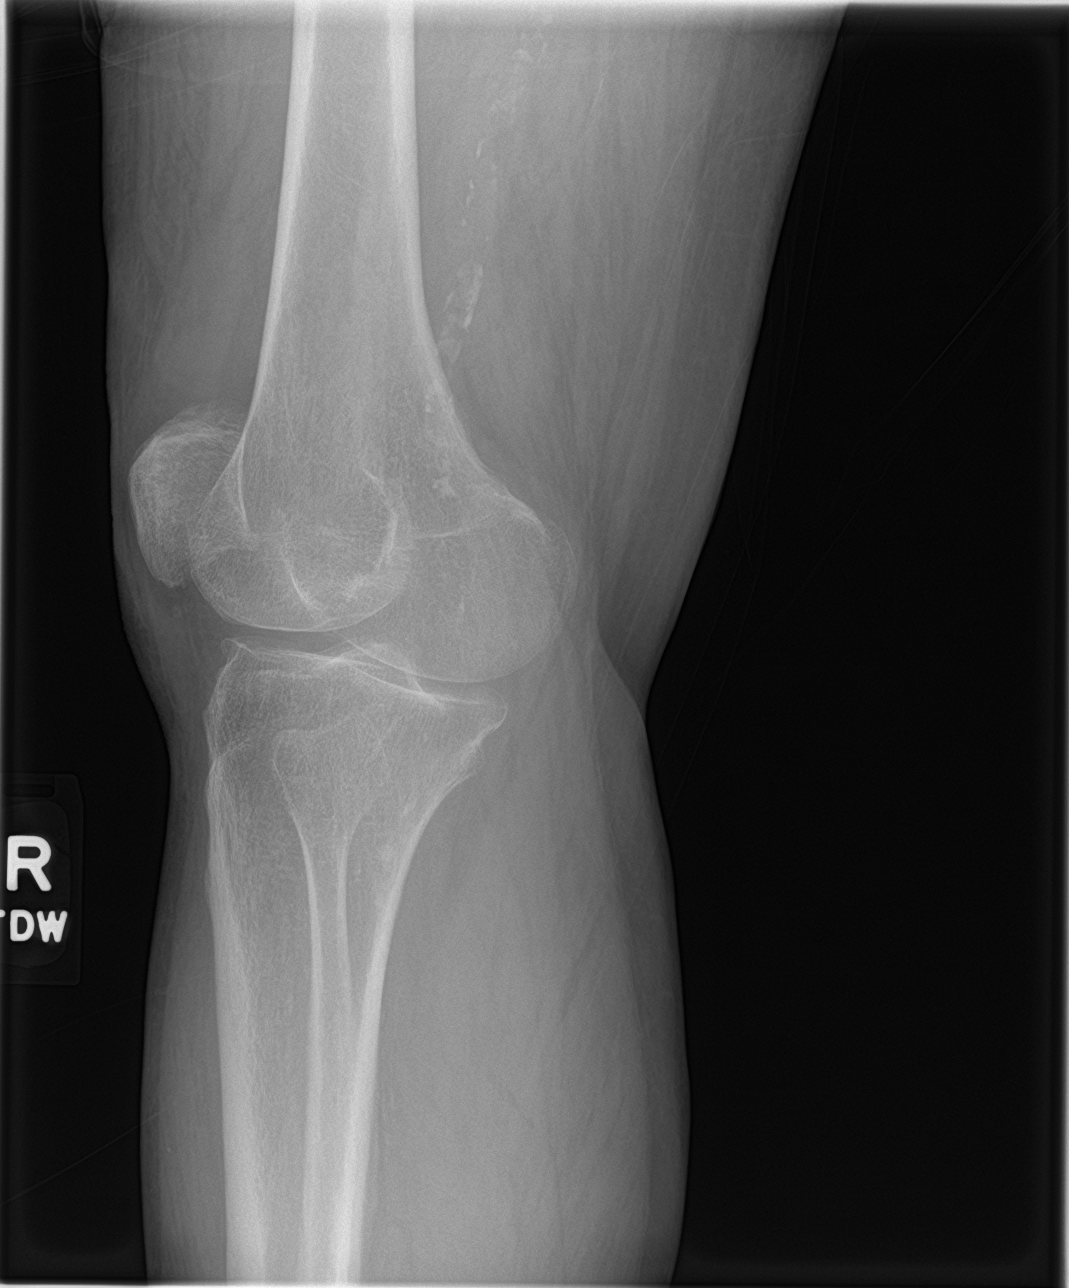

[knee ap]
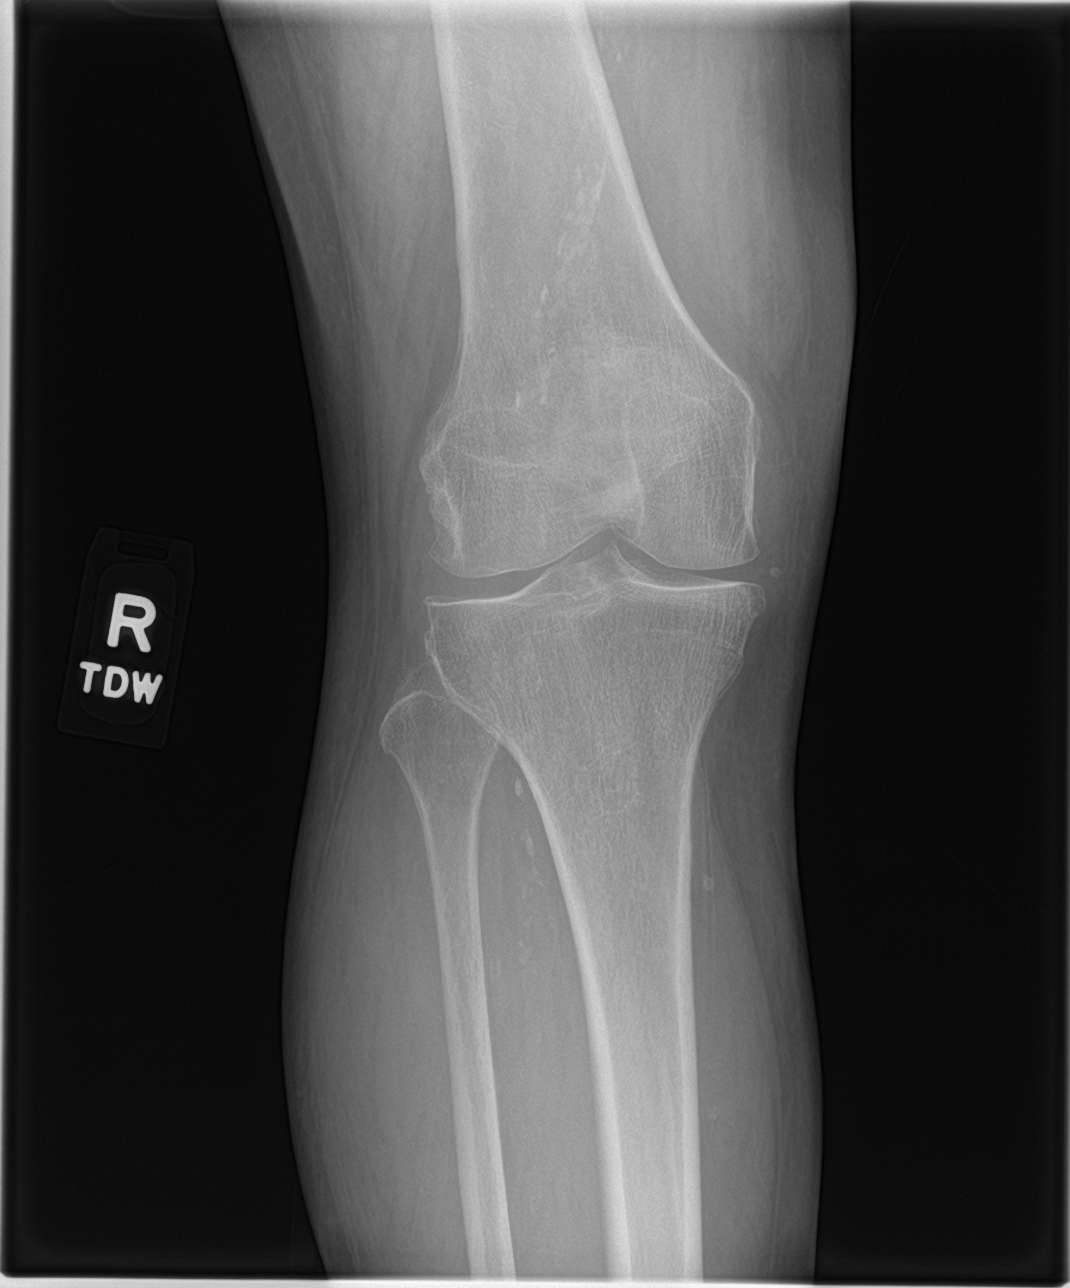

[4 of 4 positions shown; findings below may reference images not displayed]

FINDINGS: No evidence of fracture, dislocation, or joint effusion. Mild
tricompartment degenerative spurring appear similar and there is no
significant joint space narrowing. No other bone lesions identified.
Peripheral arterial vascular calcification is new since previous
study in [[facility]].
IMPRESSION: 1. No acute findings.
2. Mild tricompartmental osteoarthritis.

## 2020-05-07 IMAGING — CR DG ANKLE COMPLETE 3+V*L*
3 series · 3 of 3 positions shown · non-contrast
Comparison: None.

CLINICAL DATA: Trip and fall. Left ankle pain.  Initial encounter.

EXAM:
LEFT ANKLE COMPLETE - 3+ VIEW

[ankle ap]
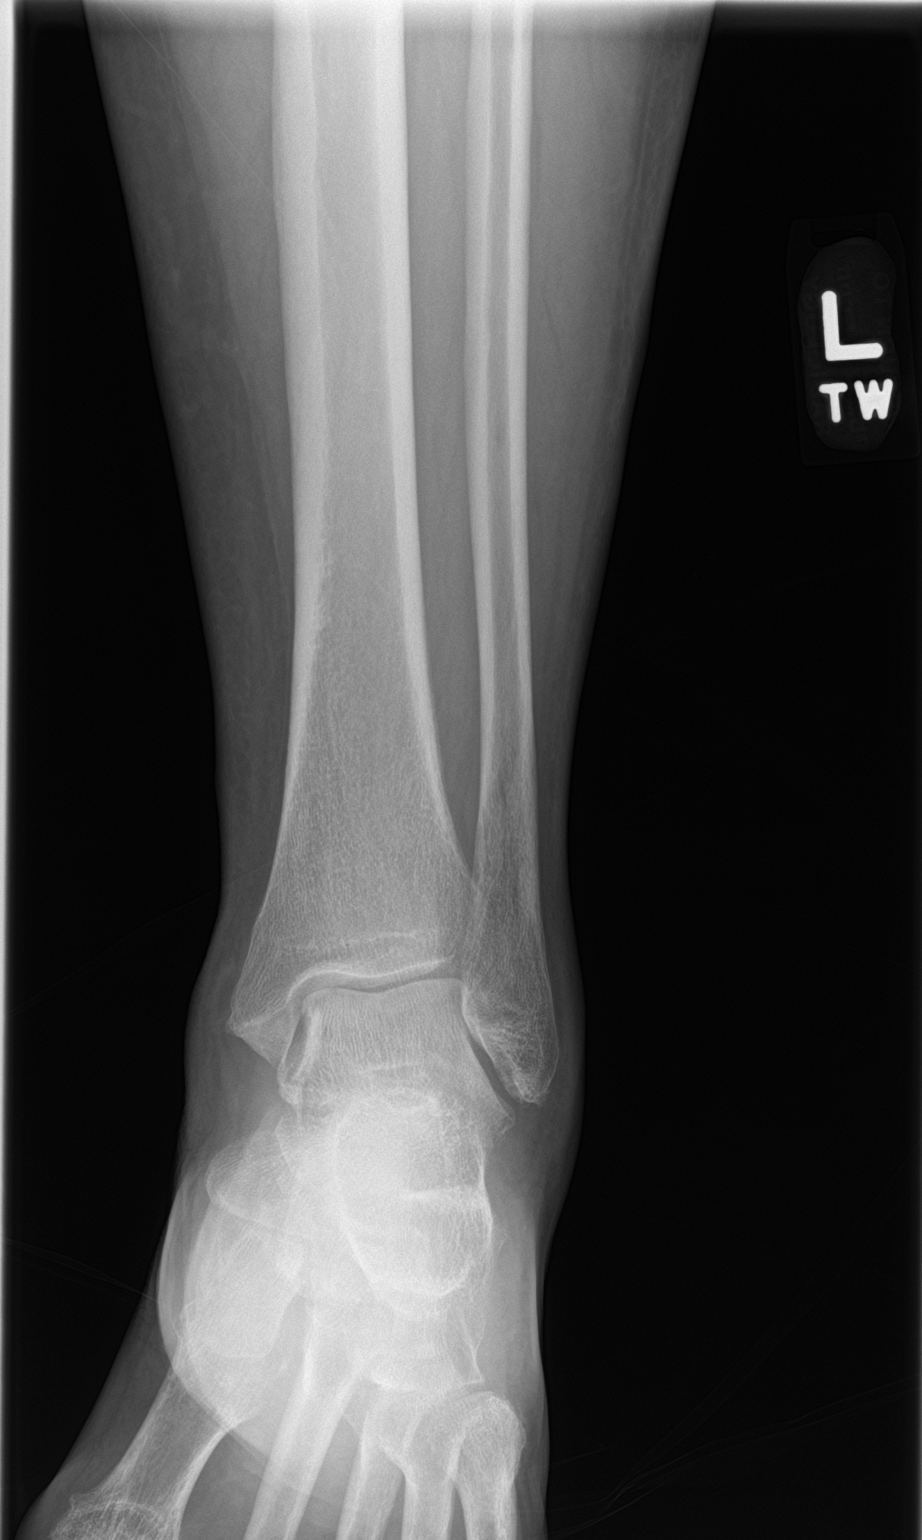

[ankle obl]
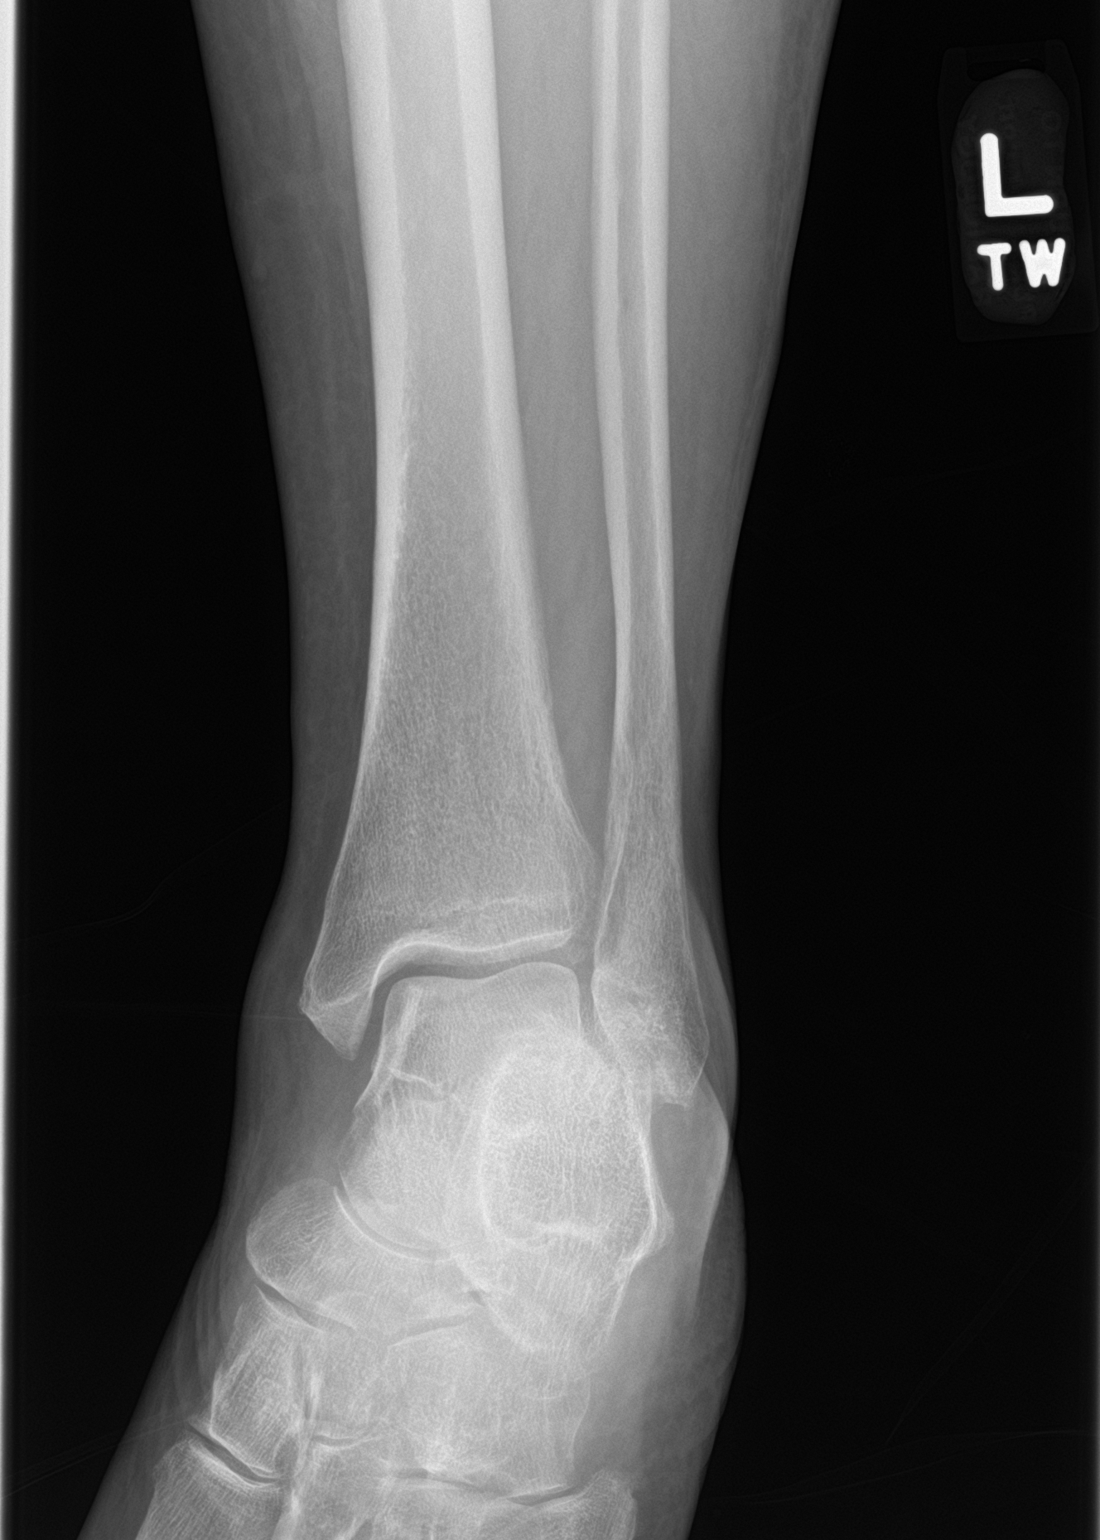

[ankle lat]
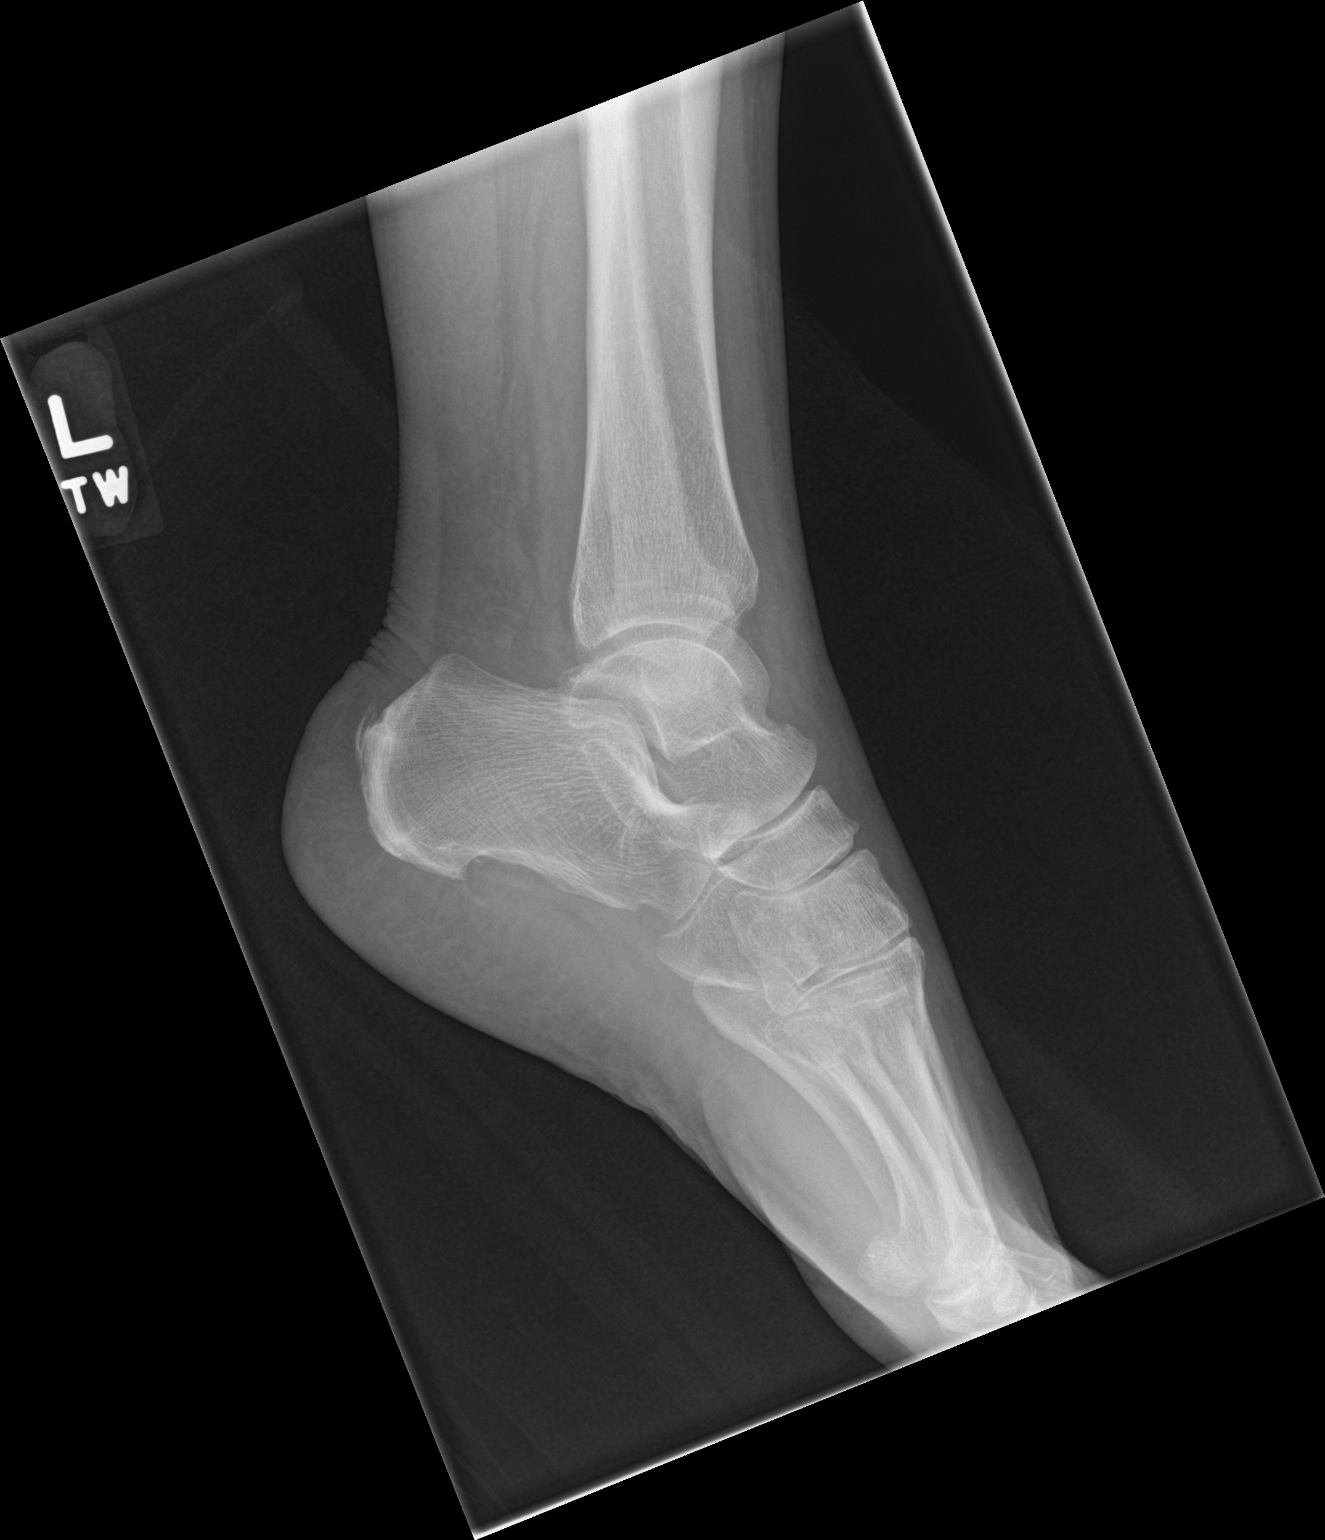

[3 of 3 positions shown; findings below may reference images not displayed]

FINDINGS: There is no evidence of fracture, dislocation, or joint effusion.
There is no evidence of arthropathy. Small plantar and dorsal
calcaneal bone spurs incidentally noted. Soft tissues are
unremarkable.
IMPRESSION: No acute findings.

## 2020-05-07 IMAGING — CR DG KNEE COMPLETE 4+V*L*
4 series · 4 of 4 positions shown · non-contrast
Comparison: None.

CLINICAL DATA: Trip and fall. Left knee pain.  Initial encounter.

EXAM:
LEFT KNEE - COMPLETE 4+ VIEW

[knee ap]
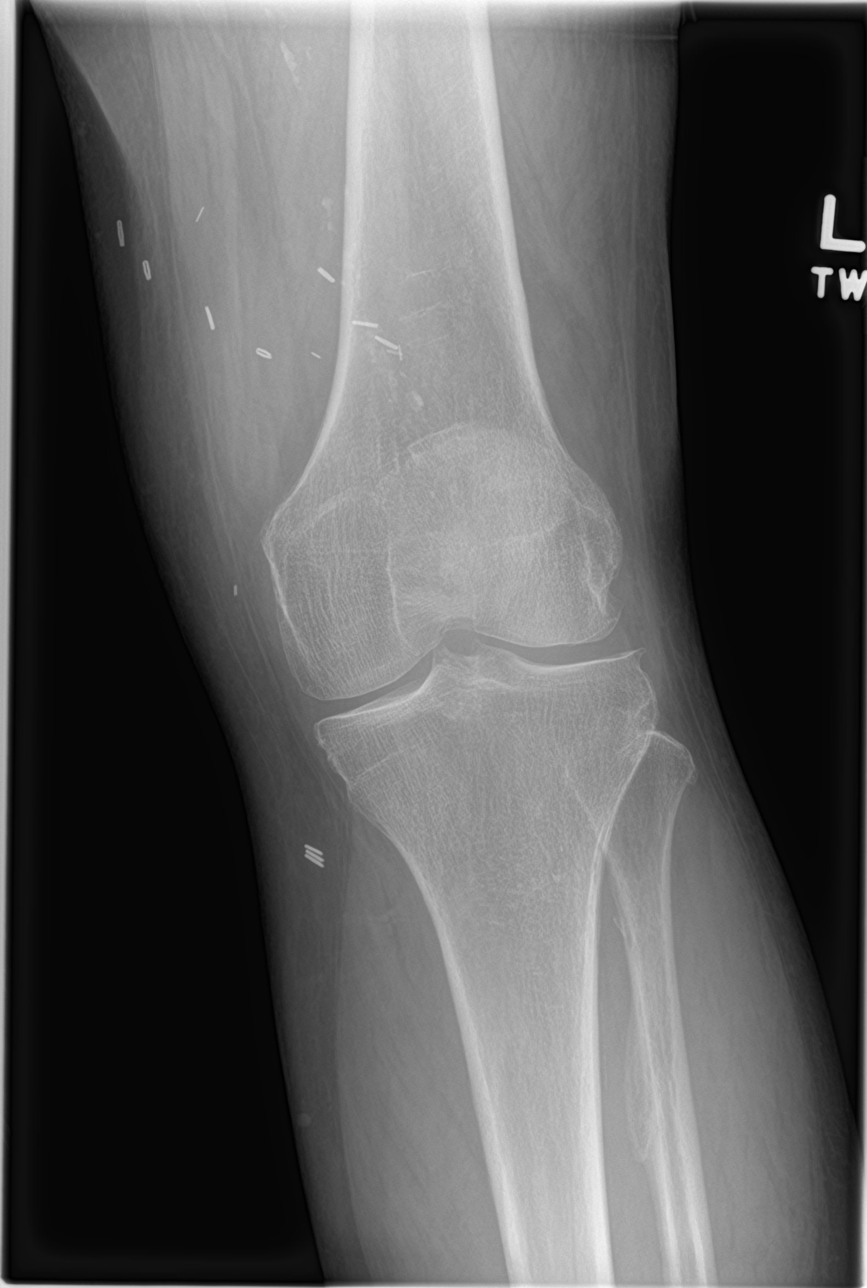

[knee lat]
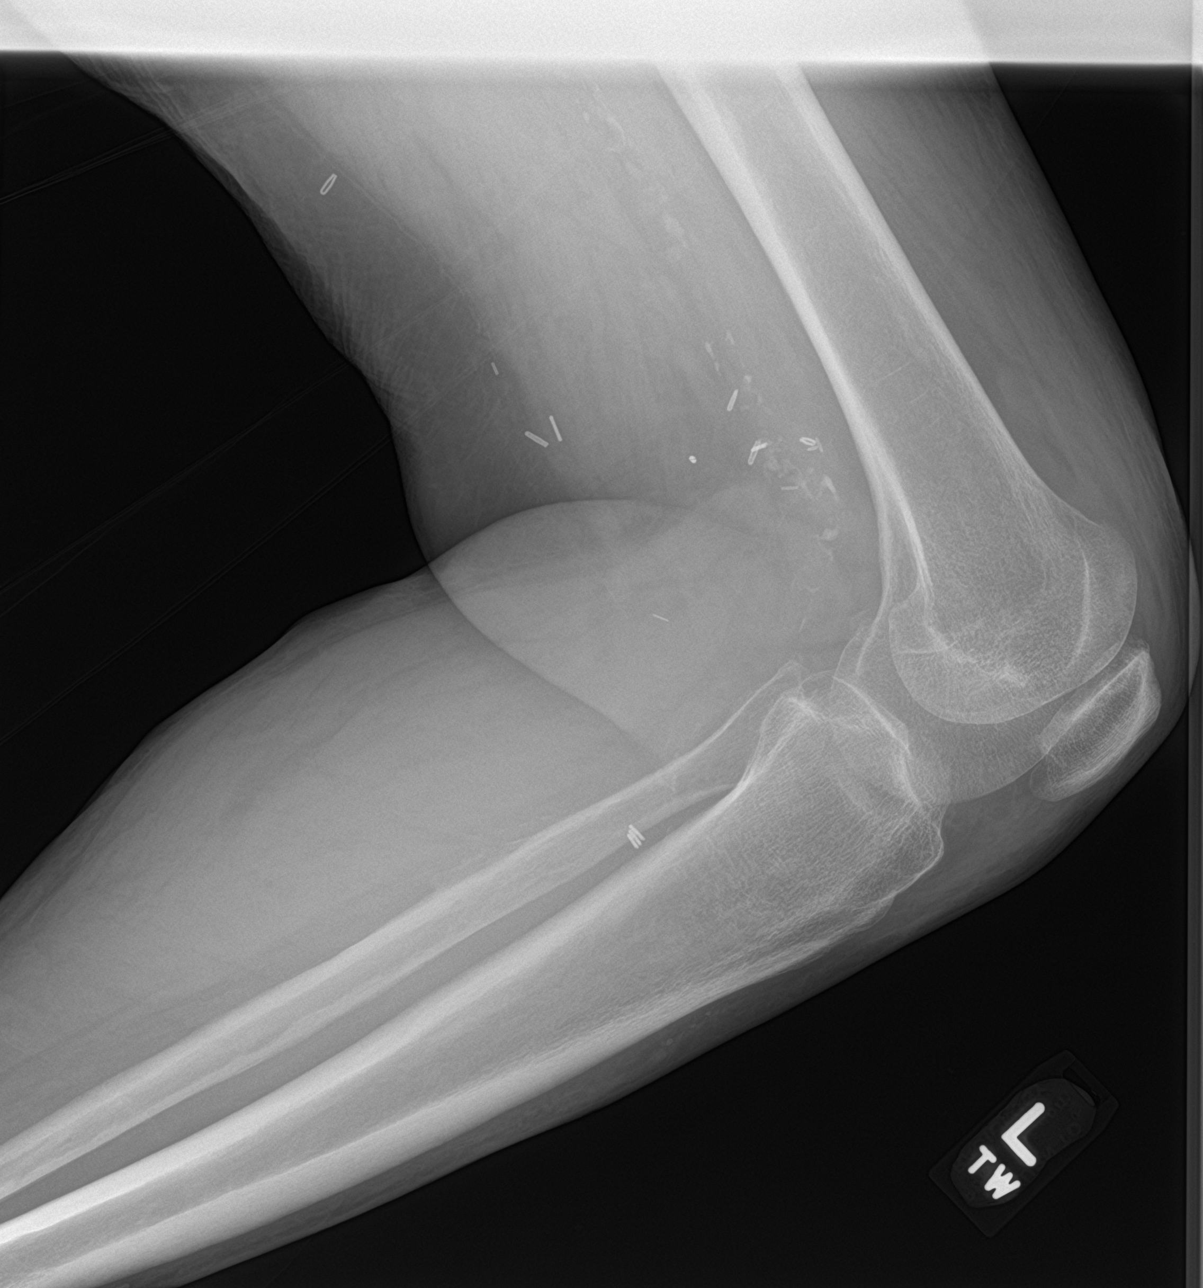

[knee obl (1 of 2)]
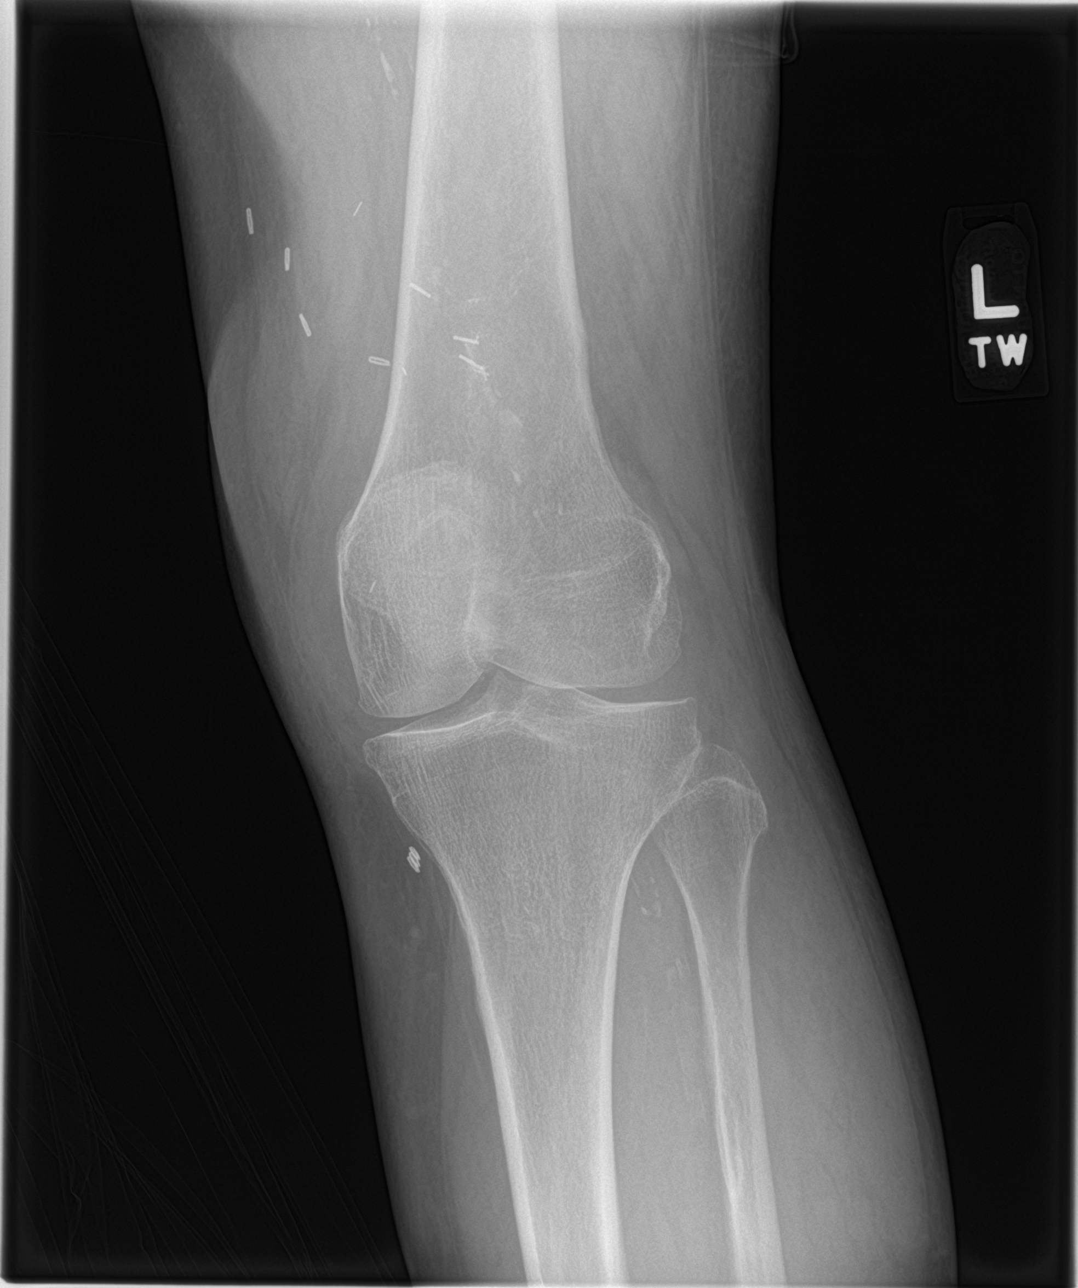

[knee obl (2 of 2)]
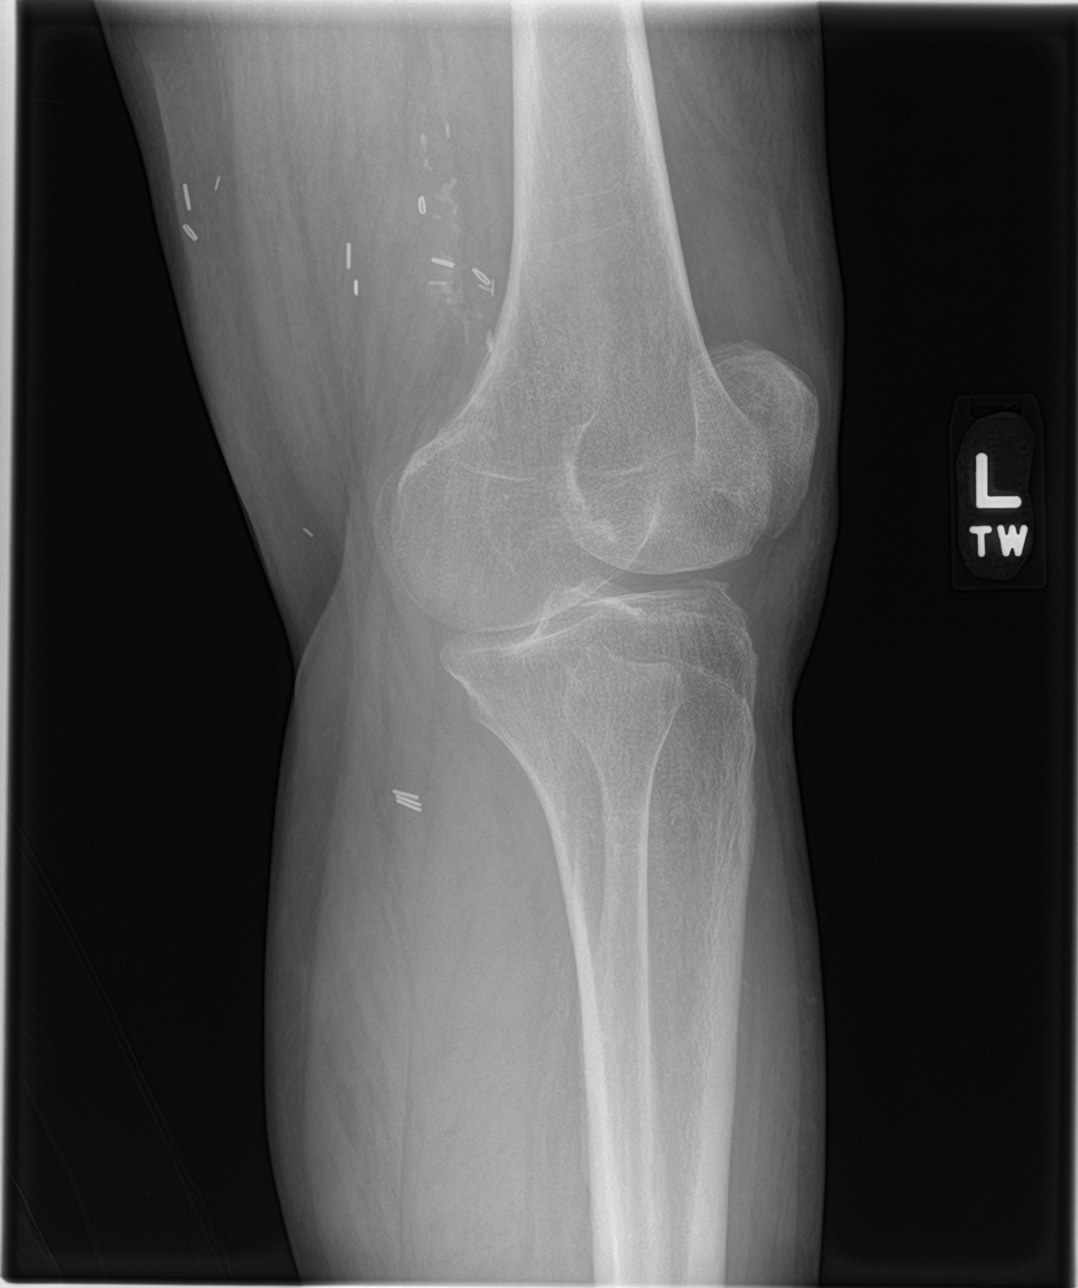

[4 of 4 positions shown; findings below may reference images not displayed]

FINDINGS: No evidence of fracture, dislocation, or joint effusion. No minimal
degenerative spurring of medial and lateral compartments is seen
without significant joint space narrowing. No other osseous
abnormality identified. Peripheral arterial vascular calcification
and surgical clips noted.
IMPRESSION: No acute findings. Minimal degenerative spurring.

## 2020-05-07 MED ORDER — HYDROCODONE-ACETAMINOPHEN 5-325 MG PO TABS
1.0000 | ORAL_TABLET | Freq: Once | ORAL | Status: AC
  Administered 2020-05-07: 1 via ORAL
  Filled 2020-05-07: qty 1

## 2020-05-07 MED ORDER — HYDROCODONE-ACETAMINOPHEN 5-325 MG PO TABS: 1.0000 | ORAL_TABLET | ORAL | 0 refills | Status: DC | PRN

## 2020-05-07 NOTE — ED Notes (Signed)
Transported to xray 

## 2020-05-07 NOTE — Progress Notes (Signed)
Orthopedic Tech Progress Note Patient Details:  Whitney Walker [birth year] [id number]  Ortho Devices Type of Ortho Device: Ankle splint Ortho Device/Splint Interventions: Application   Post Interventions Patient Tolerated: Well Instructions Provided: Adjustment of device   [name] 05/07/2020, 3:44 PM

## 2020-05-07 NOTE — ED Provider Notes (Signed)
[facility] Provider Note   CSN: [id number] Arrival date & time: 05/07/20  1229     History Chief Complaint  Patient presents with  . Ankle Pain  . Knee Pain  . Fall    Whitney Walker is a 64 y.o. female with hx of CHF, hx of DVT/PE on Xarelto, ESRD on dialysis M/W/F who presents with a fall. She states she was going in to a restaurant with her friend today and the door was open and she didn't see two steps down and she fell forward. She hit her knees and twisted her L ankle. She denies head injury or LOC. She had difficulty getting up due to pain. She states most of her pain is in her L ankle. She also has pain in her R knee with movement and mild pain in the L knee that feels like a "burning". She has not taken anything for pain prior to arrival. Movement makes it worse. Being still makes it better.  HPI     Past Medical History:  Diagnosis Date  . Anemia   . Anxiety   . CHF (congestive heart failure) ([place])   . Colon cancer [facility])    treatment surgery  . Complication of anesthesia    after first C- [place] "couldnt walk after", patient denies having a spinal  . COPD (chronic obstructive pulmonary disease) ([place])   . Coronary artery disease   . Depression   . DVT (deep venous thrombosis) ([place])   . ESRD (end stage renal disease) ([place])    Hemo: MWF  . History of blood transfusion 04/2018  . Hypertension    07/07/18- no longer takes BP medications  . Meningitis   . Pain in limb 07/30/2013  . PE (pulmonary embolism)   . Peripheral vascular disease ([place])   . Restless legs   . Shortness of breath    with exertion  . Sleep apnea   . SOB (shortness of breath) 03/03/2019  . Vertigo     Patient Active Problem List   Diagnosis Date Noted  . Restless leg syndrome 09/19/2018  . OSA (obstructive sleep apnea) 04/29/2018  . ESRD on dialysis ([place]) 04/29/2018  . Dyspnea on exertion 03/12/2017  . Morbid (severe) obesity due to excess  calories ([place]) 03/12/2017  . Anemia of chronic kidney failure, stage 3 (moderate) 12/10/2016  . History of colon cancer 08/12/2016  . Mass of left breast on mammogram 12/10/2015  . HTN (hypertension) 10/18/2015  . Depression 10/18/2015  . Numbness and tingling of left arm and leg 01/24/2015  . PVD (peripheral vascular disease) with claudication ([place]) 03/23/2014  . PAD (peripheral artery disease) ([place]) 12/14/2013  . CKD (chronic kidney disease) stage 4, GFR 15-29 ml/min (H[facility]) 11/15/2013  . Chronic diastolic heart failure ([place]) 11/15/2013  . Essential hypertension 11/15/2013  . Leucocytosis 04/28/2012    Past Surgical History:  Procedure Laterality Date  . ABDOMINAL AORTAGRAM N/A 07/26/2013   Procedure: ABDOMINAL Maxcine Ham;  Surgeon: [name] Byron, MD;  Location: [facility] CATH LAB;  Service: Cardiovascular;  Laterality: N/A;  . ABDOMINAL HYSTERECTOMY    . AV FISTULA PLACEMENT Left 05/05/2018   Procedure: ARTERIOVENOUS (AV) FISTULA CREATION BRACHIOCEPHALIC;  Surgeon: Waynetta Sandy, MD;  Location: [place];  Service: Vascular;  Laterality: Left;  . AV FISTULA PLACEMENT Left 07/16/2018   Procedure: ARTERIOVENOUS FISTULA CREATION;  Surgeon: Waynetta Sandy, MD;  Location: [place];  Service: Vascular;  Laterality: Left;  . BASCILIC VEIN TRANSPOSITION Left  09/03/2018   Procedure: BASILIC VEIN TRANSPOSITION SECOND STAGE;  Surgeon: Waynetta Sandy, MD;  Location: [place];  Service: Vascular;  Laterality: Left;  . BREAST BIOPSY Left   . CESAREAN SECTION     X 3 [id number]  . CHOLECYSTECTOMY    . CHOLECYSTECTOMY  1980/s  . COLECTOMY  2010  . DIVERTICULOSIS SURGERY-2002  2012  . FEMORAL-POPLITEAL BYPASS GRAFT Left 08/13/2013   Procedure: BYPASS GRAFT FEMORAL-POPLITEAL ARTERY WITH NON-REVERSED SAPHANEOUS VEIN; ULTRASOUND GUIDED;  Surgeon: Mal [name], MD;  Location: [place];  Service: Vascular;  Laterality: Left;  . INSERTION OF DIALYSIS CATHETER Right 04/30/2018   Procedure: INSERTION  OF Right Internal Jugular DIALYSIS CATHETER;  Surgeon: [name], MD;  Location: [place];  Service: Vascular;  Laterality: Right;  . LOWER EXTREMITY ANGIOGRAM Left 07/26/2013   Procedure: LOWER EXTREMITY ANGIOGRAM;  Surgeon: [name] Farnham, MD;  Location: [facility] CATH LAB;  Service: Cardiovascular;  Laterality: Left;     OB History   No obstetric history on file.     Family History  Problem Relation Age of Onset  . Cancer Mother 87       breast and bone  . Cancer Father 13       prostate  . Hypertension Sister   . Bleeding Disorder Sister   . Cancer Cousin 20       breast cancer   . Hypertension Daughter   . Breast cancer Neg Hx     Social History   Tobacco Use  . Smoking status: Current Some Day Smoker    Packs/day: 0.00    Years: 40.00    Pack years: 0.00    Types: Cigarettes  . Smokeless tobacco: Former Systems developer  . Tobacco comment: has an occasional cigarette - mostly when around people  Substance Use Topics  . Alcohol use: No    Alcohol/week: 0.0 standard drinks    Comment: she used to drink alcohol, quit in 2010   . Drug use: No    Home Medications Prior to Admission medications   Medication Sig Start Date End Date Taking? Authorizing Provider  acetaminophen (TYLENOL) 500 MG tablet Take 1,000 mg by mouth 2 (two) times daily as needed for moderate pain or headache.    [provider]  albuterol (PROVENTIL HFA;VENTOLIN HFA) 108 (90 Base) MCG/ACT inhaler Inhale 2 puffs into the lungs every 6 (six) hours as needed for wheezing or shortness of breath.     [provider]  AURYXIA 1 GM 210 MG(Fe) tablet  11/01/19   [provider]  Biotin 10000 MCG TABS Take by mouth.    [provider]  calcium citrate (CALCITRATE - DOSED IN MG ELEMENTAL CALCIUM) 950 MG tablet Take 200 mg of elemental calcium by mouth 3 (three) times daily. 2 tabs with dialysis on M,W,F    Edrick Oh, MD  carvedilol (COREG) 6.25 MG tablet Take 12.5 mg by mouth 2 (two)  times daily with a meal. Only takes once a day on dialysis days (holds in morning on Monday Wednesday and Friday)     [provider]  ELIQUIS 2.5 MG TABS tablet TAKE ONE TABLET BY MOUTH TWICE A DAY 04/11/20   Truitt Merle, MD  meclizine (ANTIVERT) 12.5 MG tablet Take 12.5 mg by mouth 3 (three) times daily as needed for dizziness.    [provider]  mometasone (ELOCON) 0.1 % cream Apply to affected area daily 07/20/19 07/19/20  [name], FNP  multivitamin (RENA-VIT) TABS tablet  Take 1 tablet by mouth at bedtime.    Edrick Oh, MD  mupirocin ointment (BACTROBAN) 2 % Apply 1 application topically daily as needed (skin bumps). 04/15/19   [name], FNP  Nutritional Supplements (FEEDING SUPPLEMENT, NEPRO CARB STEADY,) LIQD Take 237 mLs by mouth 3 (three) times a week. During dialysis, Monday, Wednesday & Friday    Edrick Oh, MD  nystatin (NYSTATIN) powder Apply topically 4 (four) times daily. 10/26/19   [name], FNP  promethazine (PHENERGAN) 12.5 MG tablet Take 12.5 mg by mouth every 6 (six) hours as needed for nausea or vomiting.    [provider]  sevelamer carbonate (RENVELA) 800 MG tablet Take 4 tablets with each meal 10/20/19   [provider]  temazepam (RESTORIL) 15 MG capsule Take 1 capsule (15 mg total) by mouth at bedtime as needed for sleep. 05/04/20   [name], FNP    Allergies    Carnosine, Gadolinium derivatives, Iohexol, Iodine, and Naltrexone  Review of Systems   Review of Systems  Respiratory: Negative for shortness of breath.   Cardiovascular: Negative for chest pain.  Gastrointestinal: Negative for abdominal pain.  Musculoskeletal: Positive for arthralgias, joint swelling and myalgias.  Skin: Negative for wound.  Neurological: Negative for syncope and headaches.    Physical Exam Updated Vital Signs BP (!) 185/96 (BP Location: Left Arm)   Pulse 86   Temp 98.1 F (36.7 C) (Oral)   Resp 14   Ht 5\' 1"  (1.549 m)   Wt  91.2 kg   SpO2 100%   BMI 37.98 kg/m   Physical Exam Vitals and nursing note reviewed.  Constitutional:      General: She is not in acute distress.    Appearance: Normal appearance. She is well-developed. She is not ill-appearing.     Comments: Calm and cooperative. NAD  HENT:     Head: Normocephalic and atraumatic.  Eyes:     General: No scleral icterus.       Right eye: No discharge.        Left eye: No discharge.     Conjunctiva/sclera: Conjunctivae normal.     Pupils: Pupils are equal, round, and reactive to light.  Cardiovascular:     Rate and Rhythm: Normal rate.  Pulmonary:     Effort: Pulmonary effort is normal. No respiratory distress.  Abdominal:     General: There is no distension.  Musculoskeletal:     Cervical back: Normal range of motion.     Comments: Right knee: No obvious swelling, deformity, or warmth. Mild tenderness over the anterior knee. FROM. 5/5 strength. N/V intact.  Left knee: No obvious swelling, deformity, or warmth. No tenderness. FROM. 5/5 strength. N/V intact.  Left ankle: Swelling over the lateral ankle with associated tnderness. No deformity. 2+DP pulse   Skin:    General: Skin is warm and dry.  Neurological:     Mental Status: She is alert and oriented to person, place, and time.  Psychiatric:        Behavior: Behavior normal.     ED Results / Procedures / Treatments   Labs (all labs ordered are listed, but only abnormal results are displayed) Labs Reviewed - No data to display  EKG None  Radiology No results found.  Procedures Procedures (including critical care time)  Medications Ordered in ED Medications - No data to display  ED Course  I have reviewed the triage vital signs and the nursing notes.  Pertinent labs & imaging results that  were available during my care of the patient were reviewed by me and considered in my medical decision making (see chart for details).  64 year old female presents with a mechanical  fall today.  Pain is primarily in the left ankle but she is also having bilateral knee pain.  There is swelling over the lateral aspect of the ankle and x-ray was obtained which is negative.  We will treat as an ankle sprain with ASO and advised rest, ice, elevation.  She is on dialysis and cannot take NSAIDs.  X-rays of the bilateral knees show mild arthritis but no acute bony abnormality.  We will give her a dose of Norco and prescription for the same for a couple of days.  She cannot use crutches and was advised to use a walker which she has at home.  Advise follow-up with PCP.  MDM Rules/Calculators/A&P                       Final Clinical Impression(s) / ED Diagnoses Final diagnoses:  Fall, initial encounter  Acute pain of right knee  Acute pain of left knee  Sprain of left ankle, unspecified ligament, initial encounter    Rx / DC Orders ED Discharge Orders    None       Recardo Evangelist, PA-C 05/07/20 1436    Long, Wonda [facility], MD 05/12/20 [phone]

## 2020-05-07 NOTE — ED Triage Notes (Signed)
C/o L ankle and bilateral knee pain.  States she went to a restaurant and didn't know there was a step going down inside and fell just PTA.

## 2020-05-07 NOTE — Discharge Instructions (Signed)
Rest - please stay off ankle as much as possible. Use a walker when your are out of bed Ice - ice for 20 minutes at a time, several times a day Compression - wear brace to provide support Elevate - elevate ankle above level of heart Take Norco every 6-8 hours as needed for severe pain

## 2020-05-07 NOTE — ED Notes (Signed)
Pt transported to Xray. 

## 2020-05-08 ENCOUNTER — Emergency Department (HOSPITAL_COMMUNITY)
Admission: EM | Admit: 2020-05-08 | Discharge: 2020-05-08 | Disposition: A | Discharge status: Home / Self Care | Payer: Medicare Other | Source: Home / Self Care | Attending: Emergency Medicine | Admitting: Emergency Medicine

## 2020-05-08 ENCOUNTER — Ambulatory Visit: Payer: Self-pay

## 2020-05-08 ENCOUNTER — Other Ambulatory Visit: Payer: Self-pay

## 2020-05-08 ENCOUNTER — Emergency Department (HOSPITAL_COMMUNITY): Payer: Medicare Other

## 2020-05-08 ENCOUNTER — Inpatient Hospital Stay (HOSPITAL_COMMUNITY)
Admission: EM | Admit: 2020-05-08 | Discharge: 2020-05-10 | DRG: 640 | Disposition: A | Discharge status: Home Health | Payer: Medicare Other | Attending: Internal Medicine | Admitting: Internal Medicine

## 2020-05-08 ENCOUNTER — Ambulatory Visit (INDEPENDENT_AMBULATORY_CARE_PROVIDER_SITE_OTHER): Payer: Medicare Other

## 2020-05-08 ENCOUNTER — Telehealth: Payer: Self-pay

## 2020-05-08 ENCOUNTER — Telehealth: Payer: Self-pay | Admitting: Nurse Practitioner

## 2020-05-08 ENCOUNTER — Encounter (HOSPITAL_COMMUNITY): Payer: Self-pay | Admitting: Emergency Medicine

## 2020-05-08 DIAGNOSIS — S79911A Unspecified injury of right hip, initial encounter: Secondary | ICD-10-CM | POA: Diagnosis not present

## 2020-05-08 DIAGNOSIS — N2581 Secondary hyperparathyroidism of renal origin: Secondary | ICD-10-CM | POA: Diagnosis not present

## 2020-05-08 DIAGNOSIS — M545 Low back pain: Secondary | ICD-10-CM | POA: Insufficient documentation

## 2020-05-08 DIAGNOSIS — W19XXXA Unspecified fall, initial encounter: Secondary | ICD-10-CM | POA: Insufficient documentation

## 2020-05-08 DIAGNOSIS — Z85038 Personal history of other malignant neoplasm of large intestine: Secondary | ICD-10-CM

## 2020-05-08 DIAGNOSIS — Z7901 Long term (current) use of anticoagulants: Secondary | ICD-10-CM | POA: Insufficient documentation

## 2020-05-08 DIAGNOSIS — I739 Peripheral vascular disease, unspecified: Secondary | ICD-10-CM | POA: Diagnosis not present

## 2020-05-08 DIAGNOSIS — I10 Essential (primary) hypertension: Secondary | ICD-10-CM | POA: Insufficient documentation

## 2020-05-08 DIAGNOSIS — W19XXXD Unspecified fall, subsequent encounter: Secondary | ICD-10-CM

## 2020-05-08 DIAGNOSIS — N186 End stage renal disease: Secondary | ICD-10-CM

## 2020-05-08 DIAGNOSIS — Z992 Dependence on renal dialysis: Secondary | ICD-10-CM

## 2020-05-08 DIAGNOSIS — G4733 Obstructive sleep apnea (adult) (pediatric): Secondary | ICD-10-CM | POA: Diagnosis present

## 2020-05-08 DIAGNOSIS — Z20822 Contact with and (suspected) exposure to covid-19: Secondary | ICD-10-CM | POA: Diagnosis not present

## 2020-05-08 DIAGNOSIS — Z6837 Body mass index (BMI) 37.0-37.9, adult: Secondary | ICD-10-CM

## 2020-05-08 DIAGNOSIS — I82412 Acute embolism and thrombosis of left femoral vein: Secondary | ICD-10-CM

## 2020-05-08 DIAGNOSIS — M5431 Sciatica, right side: Secondary | ICD-10-CM | POA: Insufficient documentation

## 2020-05-08 DIAGNOSIS — F17210 Nicotine dependence, cigarettes, uncomplicated: Secondary | ICD-10-CM | POA: Insufficient documentation

## 2020-05-08 DIAGNOSIS — Z91041 Radiographic dye allergy status: Secondary | ICD-10-CM

## 2020-05-08 DIAGNOSIS — D631 Anemia in chronic kidney disease: Secondary | ICD-10-CM | POA: Diagnosis present

## 2020-05-08 DIAGNOSIS — Y929 Unspecified place or not applicable: Secondary | ICD-10-CM | POA: Insufficient documentation

## 2020-05-08 DIAGNOSIS — G2581 Restless legs syndrome: Secondary | ICD-10-CM | POA: Diagnosis present

## 2020-05-08 DIAGNOSIS — Z888 Allergy status to other drugs, medicaments and biological substances status: Secondary | ICD-10-CM

## 2020-05-08 DIAGNOSIS — I440 Atrioventricular block, first degree: Secondary | ICD-10-CM | POA: Diagnosis not present

## 2020-05-08 DIAGNOSIS — E875 Hyperkalemia: Principal | ICD-10-CM | POA: Diagnosis present

## 2020-05-08 DIAGNOSIS — J9601 Acute respiratory failure with hypoxia: Secondary | ICD-10-CM | POA: Diagnosis not present

## 2020-05-08 DIAGNOSIS — J449 Chronic obstructive pulmonary disease, unspecified: Secondary | ICD-10-CM | POA: Insufficient documentation

## 2020-05-08 DIAGNOSIS — Z9115 Patient's noncompliance with renal dialysis: Secondary | ICD-10-CM

## 2020-05-08 DIAGNOSIS — I5032 Chronic diastolic (congestive) heart failure: Secondary | ICD-10-CM

## 2020-05-08 DIAGNOSIS — Z86711 Personal history of pulmonary embolism: Secondary | ICD-10-CM | POA: Insufficient documentation

## 2020-05-08 DIAGNOSIS — R52 Pain, unspecified: Secondary | ICD-10-CM | POA: Diagnosis not present

## 2020-05-08 DIAGNOSIS — M79605 Pain in left leg: Secondary | ICD-10-CM | POA: Diagnosis not present

## 2020-05-08 DIAGNOSIS — Z86718 Personal history of other venous thrombosis and embolism: Secondary | ICD-10-CM | POA: Diagnosis not present

## 2020-05-08 DIAGNOSIS — Y939 Activity, unspecified: Secondary | ICD-10-CM | POA: Insufficient documentation

## 2020-05-08 DIAGNOSIS — I132 Hypertensive heart and chronic kidney disease with heart failure and with stage 5 chronic kidney disease, or end stage renal disease: Secondary | ICD-10-CM | POA: Diagnosis present

## 2020-05-08 DIAGNOSIS — I2510 Atherosclerotic heart disease of native coronary artery without angina pectoris: Secondary | ICD-10-CM | POA: Insufficient documentation

## 2020-05-08 DIAGNOSIS — E669 Obesity, unspecified: Secondary | ICD-10-CM | POA: Diagnosis present

## 2020-05-08 DIAGNOSIS — Z79899 Other long term (current) drug therapy: Secondary | ICD-10-CM | POA: Insufficient documentation

## 2020-05-08 DIAGNOSIS — R0602 Shortness of breath: Secondary | ICD-10-CM | POA: Diagnosis not present

## 2020-05-08 DIAGNOSIS — Z832 Family history of diseases of the blood and blood-forming organs and certain disorders involving the immune mechanism: Secondary | ICD-10-CM

## 2020-05-08 DIAGNOSIS — I161 Hypertensive emergency: Secondary | ICD-10-CM | POA: Diagnosis present

## 2020-05-08 DIAGNOSIS — I499 Cardiac arrhythmia, unspecified: Secondary | ICD-10-CM | POA: Diagnosis not present

## 2020-05-08 DIAGNOSIS — R0689 Other abnormalities of breathing: Secondary | ICD-10-CM | POA: Diagnosis not present

## 2020-05-08 DIAGNOSIS — Z8249 Family history of ischemic heart disease and other diseases of the circulatory system: Secondary | ICD-10-CM

## 2020-05-08 DIAGNOSIS — M5441 Lumbago with sciatica, right side: Secondary | ICD-10-CM | POA: Diagnosis not present

## 2020-05-08 DIAGNOSIS — Z743 Need for continuous supervision: Secondary | ICD-10-CM | POA: Diagnosis not present

## 2020-05-08 DIAGNOSIS — R0902 Hypoxemia: Secondary | ICD-10-CM | POA: Diagnosis not present

## 2020-05-08 DIAGNOSIS — S3992XA Unspecified injury of lower back, initial encounter: Secondary | ICD-10-CM | POA: Diagnosis not present

## 2020-05-08 IMAGING — CR DG LUMBAR SPINE COMPLETE 4+V
5 series · 5 of 5 positions shown · non-contrast
Comparison: None.

CLINICAL DATA: Fall. Pain.

EXAM:
LUMBAR SPINE - COMPLETE 4+ VIEW

[l-spine ap]
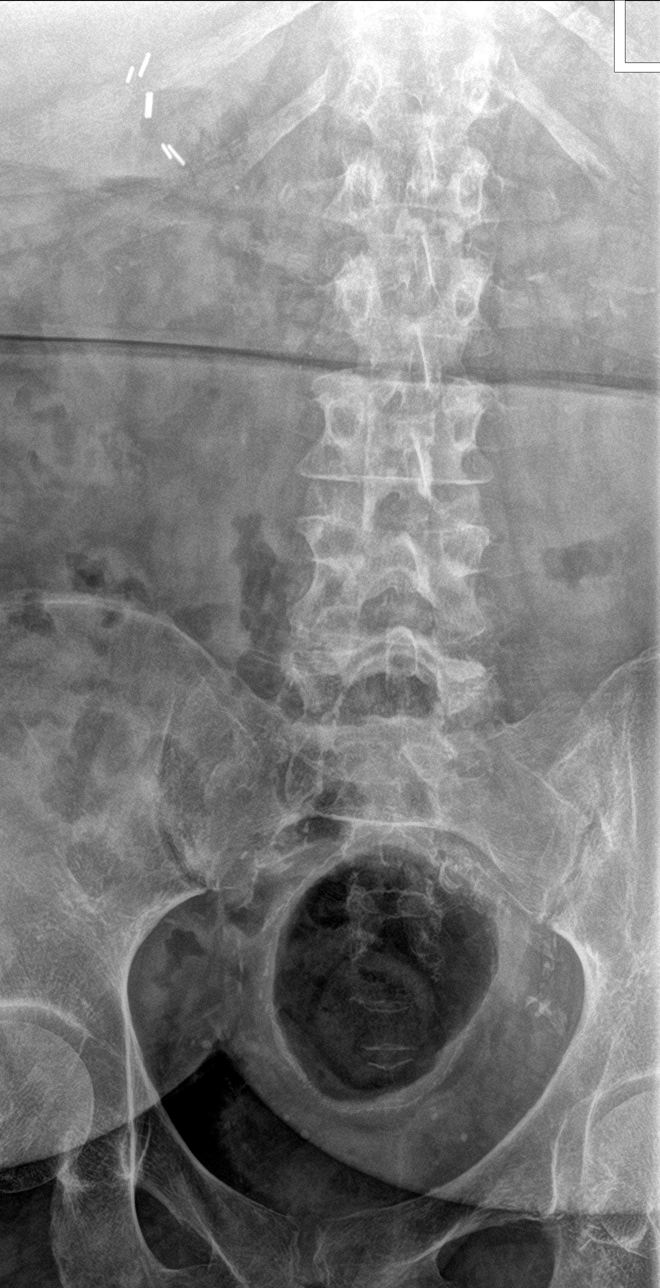

[l-spine obl (1 of 2)]
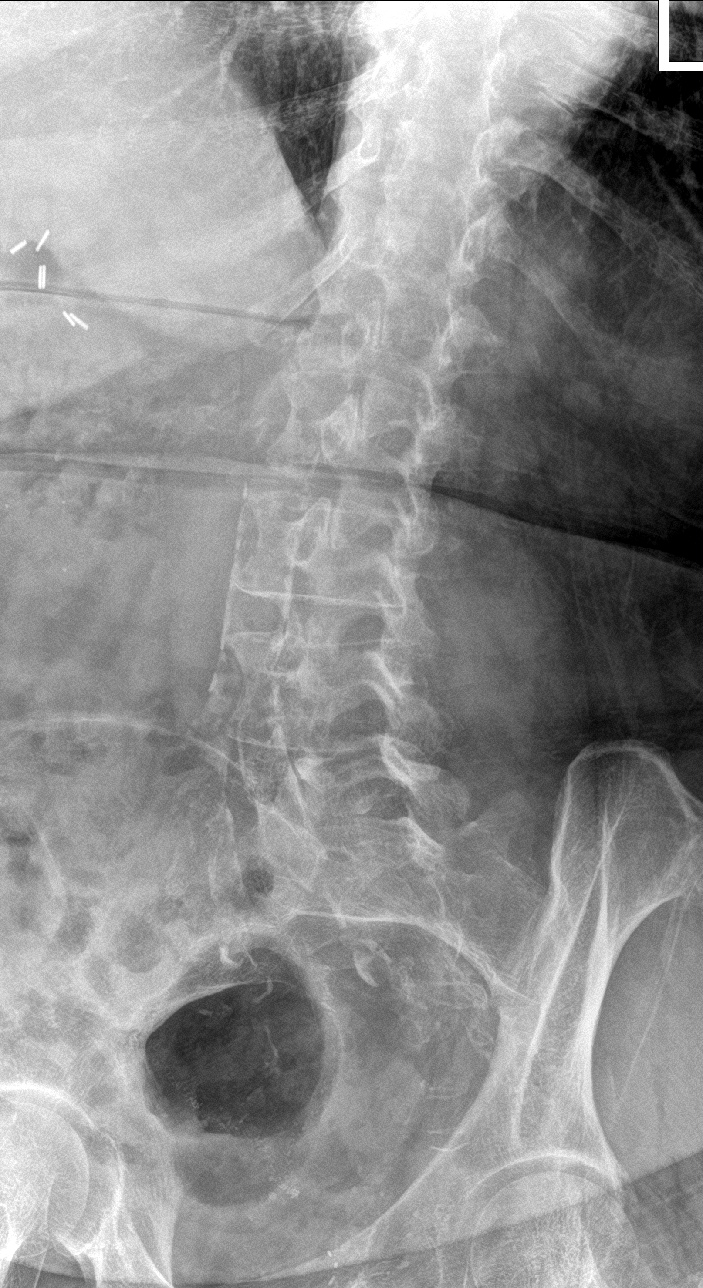

[l-spine obl (2 of 2)]
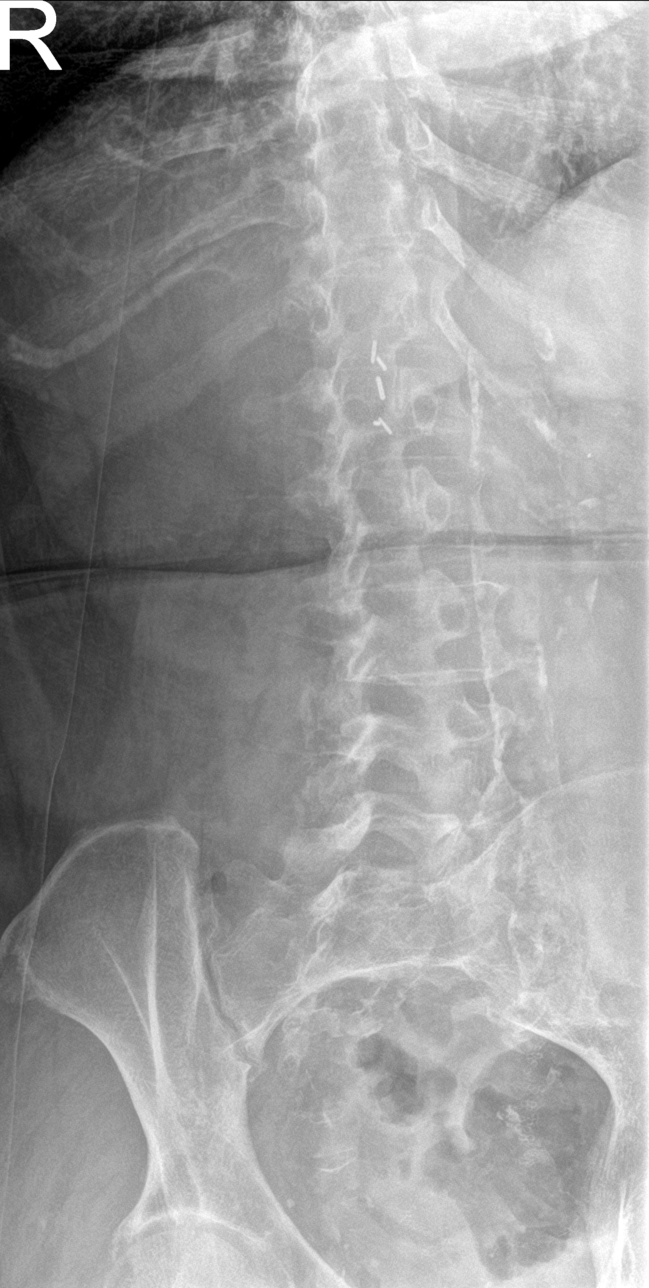

[l-spine lat]
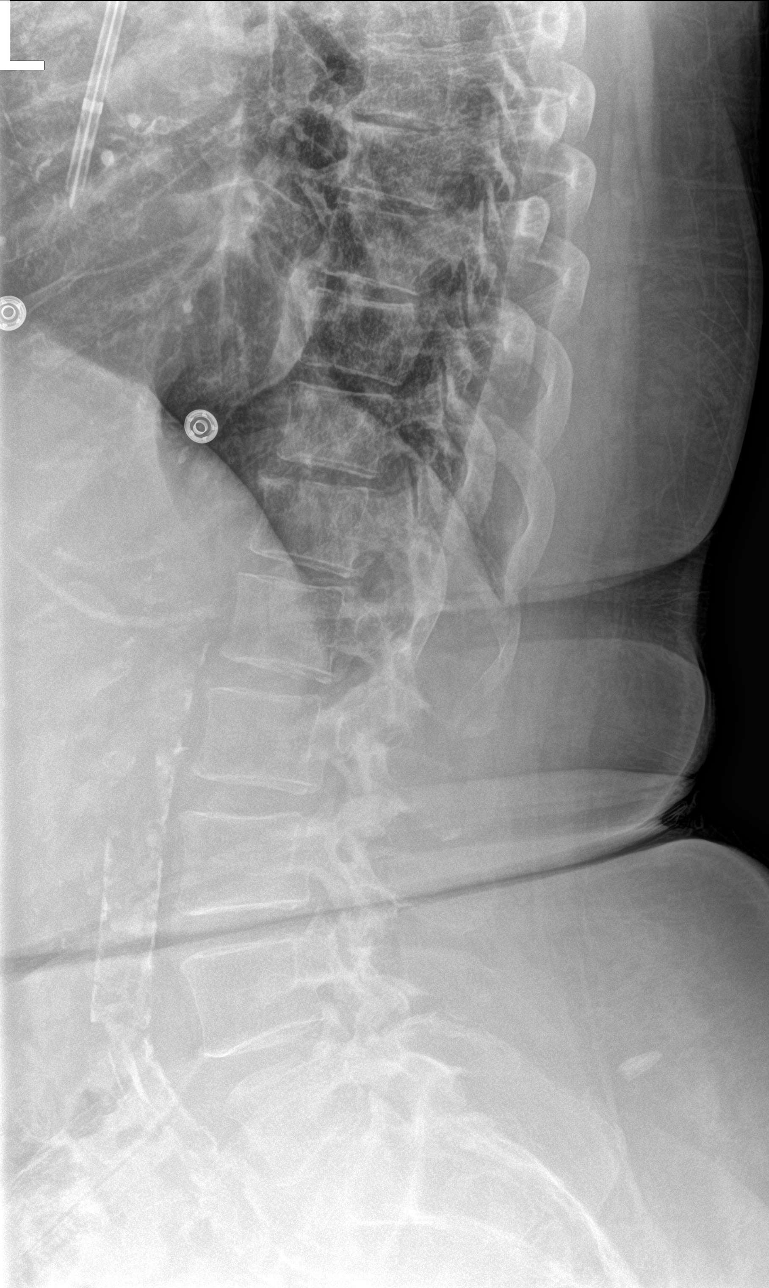

[l-spine spot]
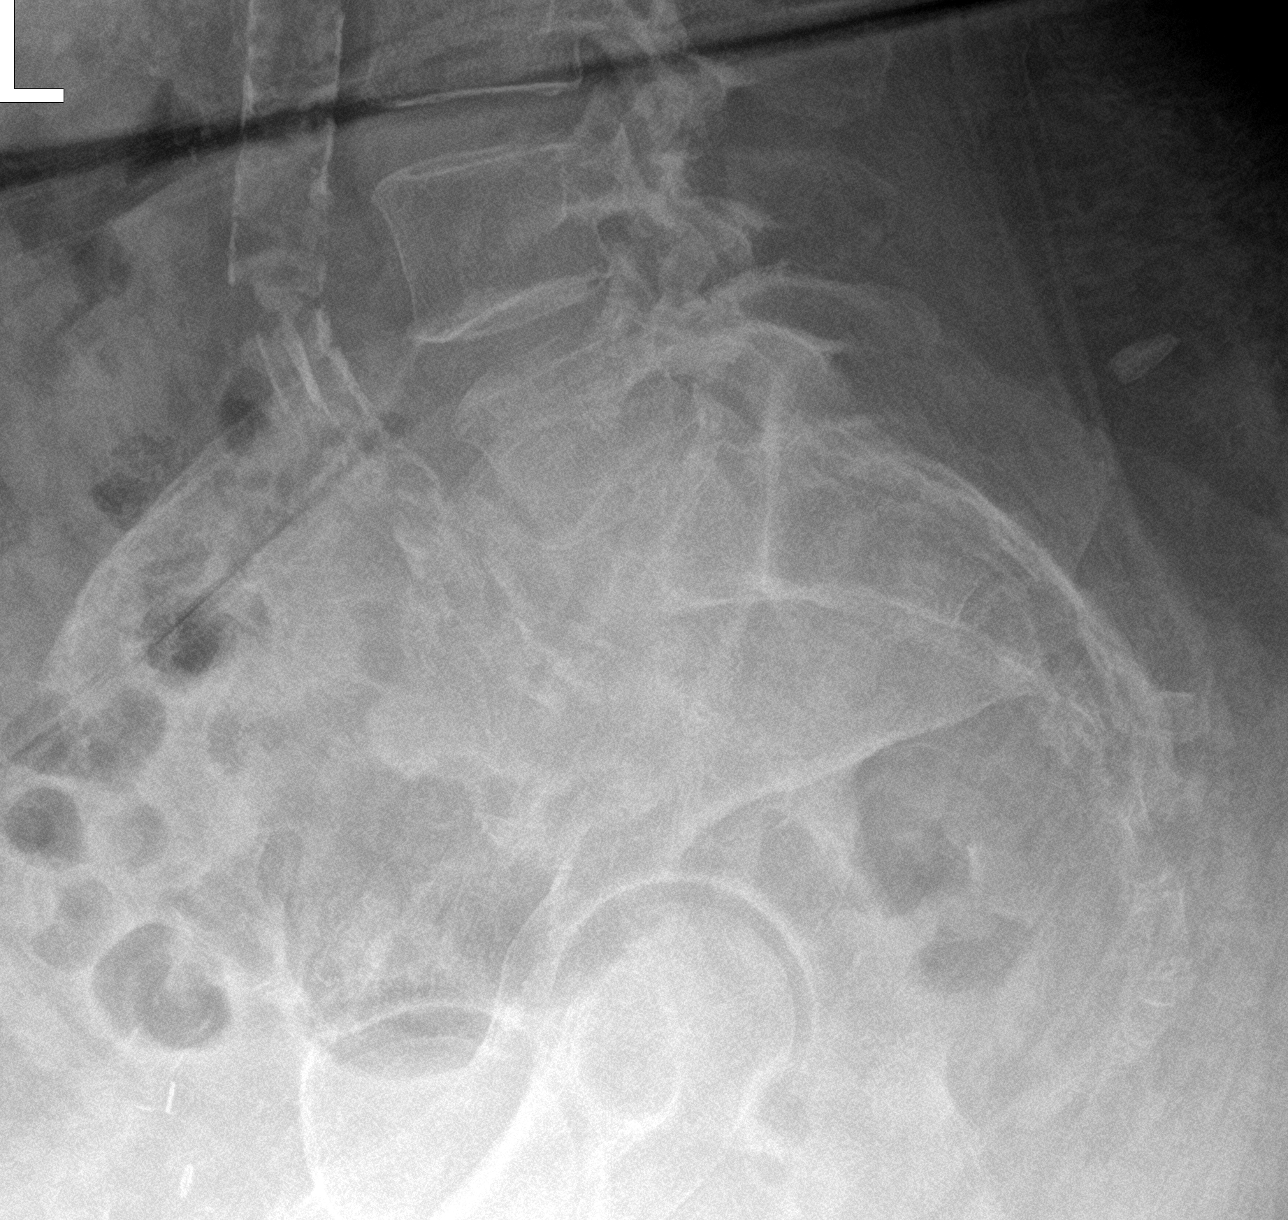

[5 of 5 positions shown; findings below may reference images not displayed]

FINDINGS: Five non rib-bearing lumbar type vertebral bodies are present.
Vertebral body heights and alignment are normal. Degenerative
changes are noted in the lower lumbar facets. No acute or healing
fractures are present. Atherosclerotic calcifications are present in
the aorta and branch vessels. Dialysis catheter is in place.
Surgical clips are present at the gallbladder fossa. The bowel gas
pattern is normal.
IMPRESSION: 1. Degenerative changes in the lower lumbar facets.
2. No acute or healing fractures.
3. Atherosclerosis.

## 2020-05-08 IMAGING — CR DG HIP (WITH OR WITHOUT PELVIS) 2-3V*R*
3 series · 3 of 3 positions shown · non-contrast
Comparison: None.

CLINICAL DATA: Fall. Right hip pain.

EXAM:
DG HIP (WITH OR WITHOUT PELVIS) 2-3V RIGHT

[pelvis ap]
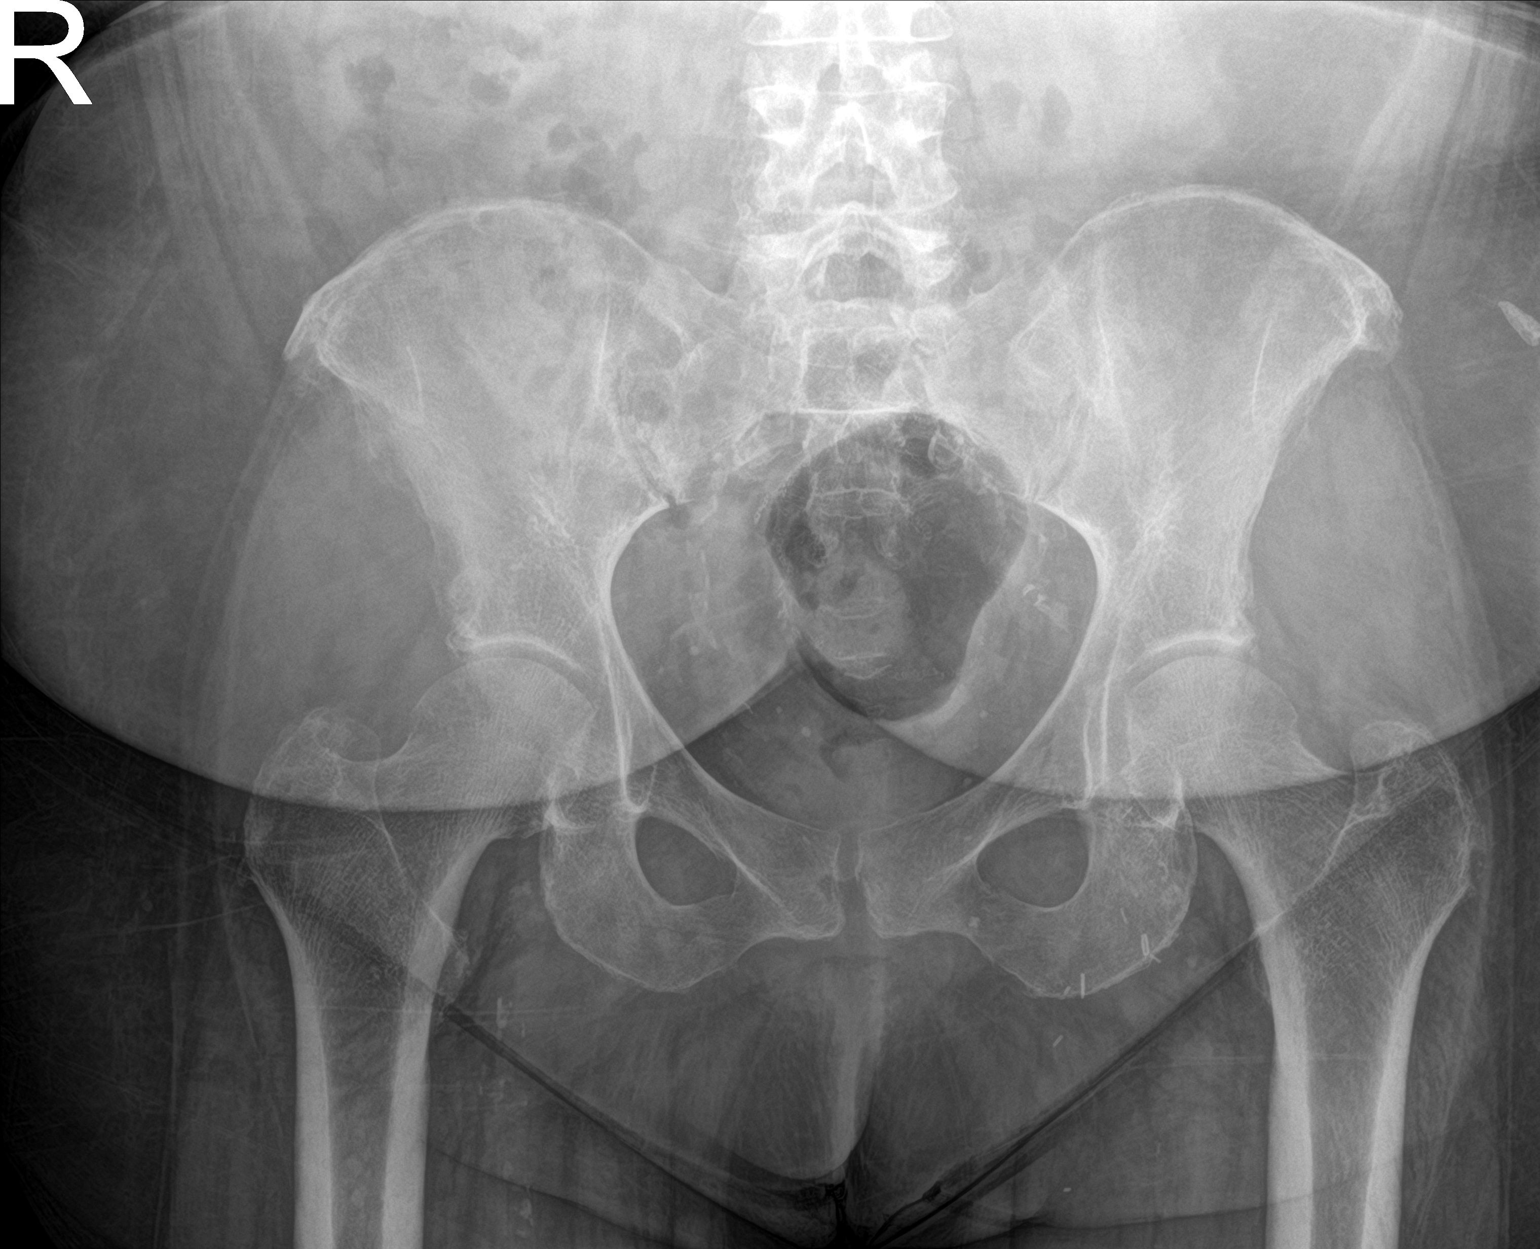

[hip ap]
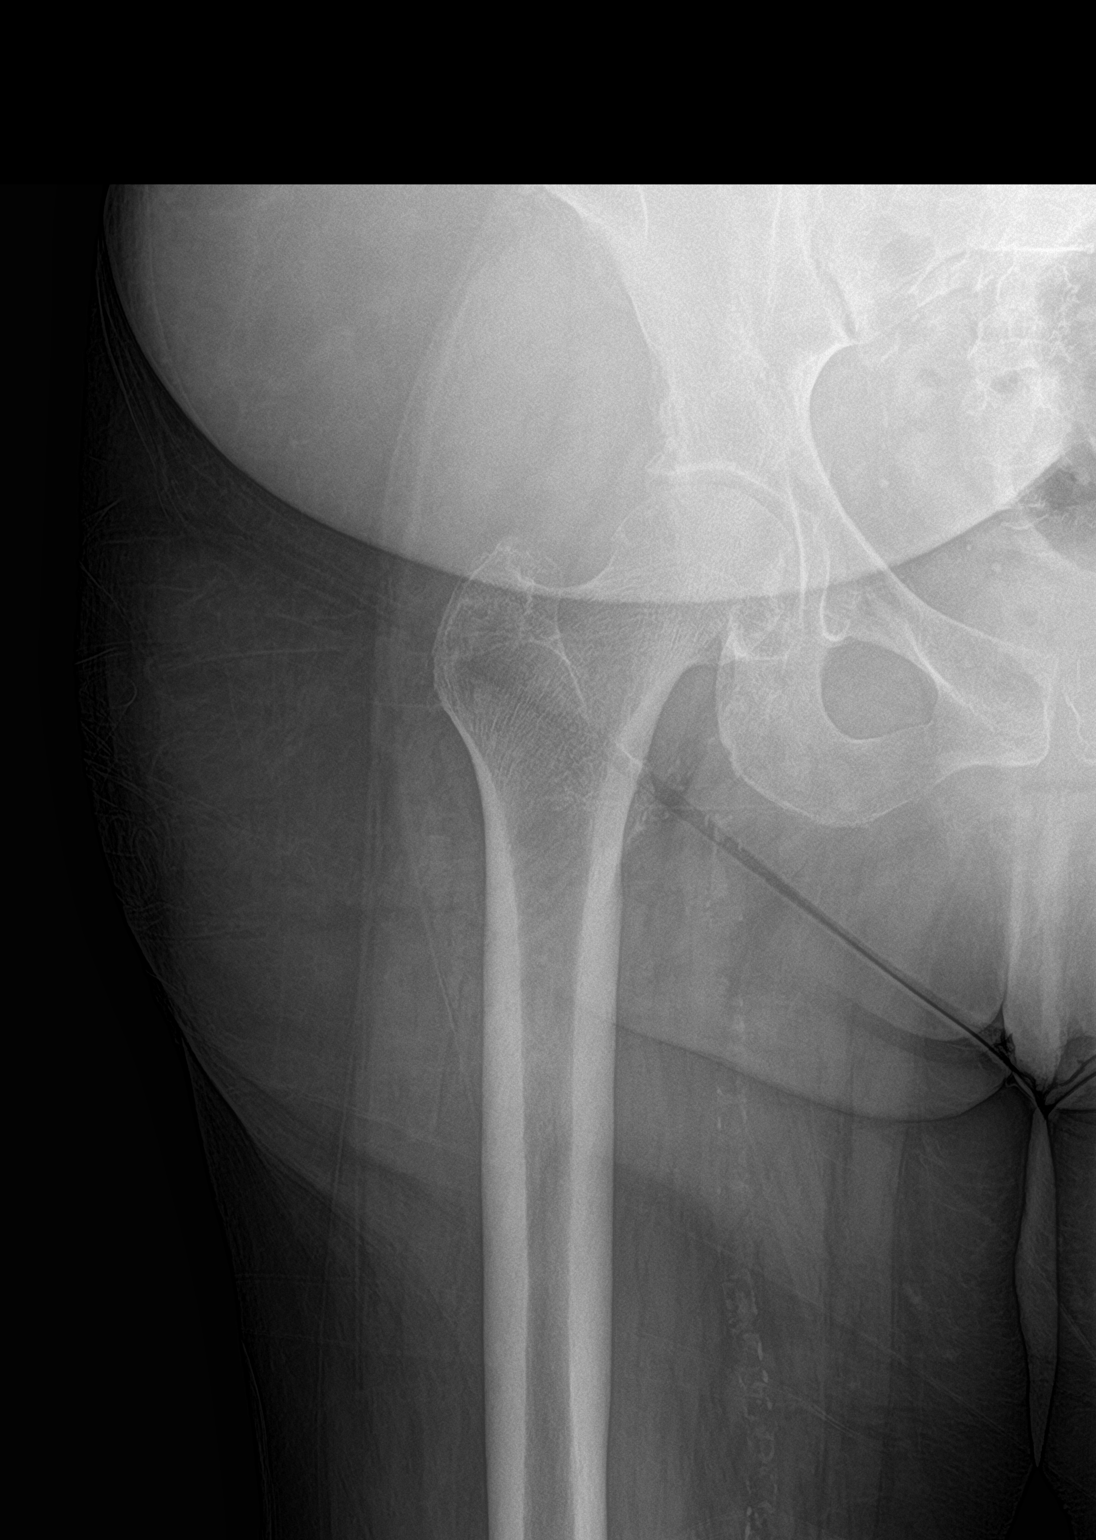

[hip lat]
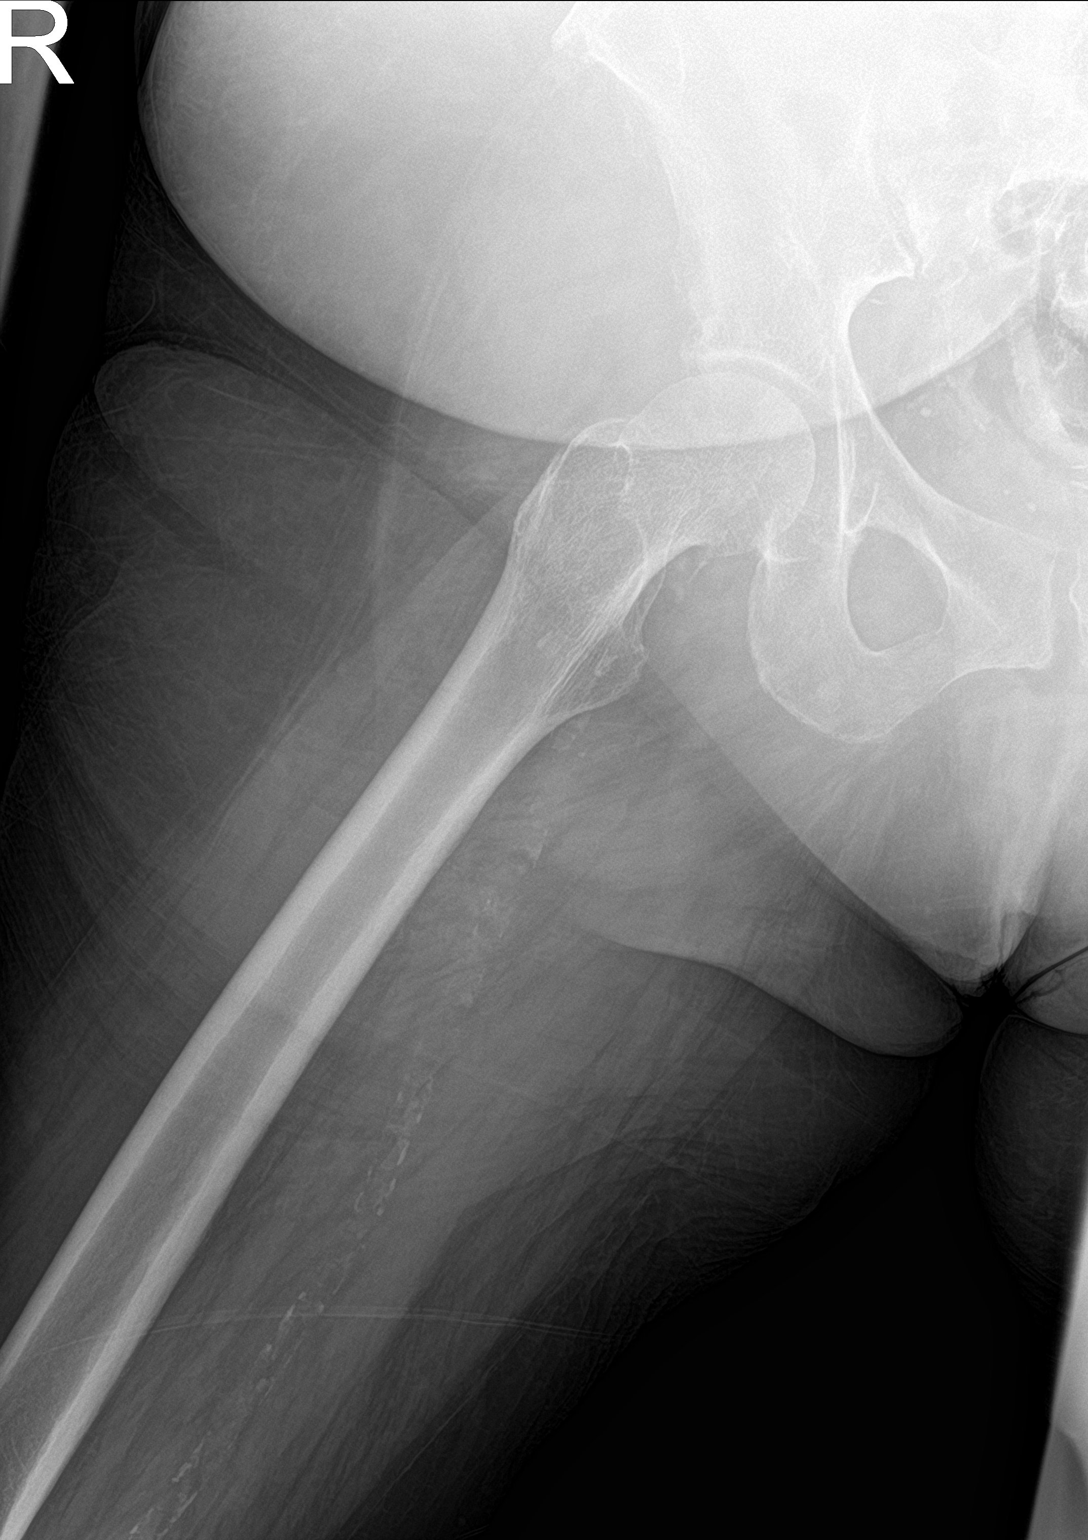

[3 of 3 positions shown; findings below may reference images not displayed]

FINDINGS: Pelvis is unremarkable. Right hip is located. No acute or healing
fracture is present. Atherosclerotic changes are noted.
IMPRESSION: 1. No acute or healing fracture.
2. Atherosclerosis.

## 2020-05-08 MED ORDER — OXYCODONE-ACETAMINOPHEN 5-325 MG PO TABS: 2.0000 | ORAL_TABLET | ORAL | 0 refills | Status: DC | PRN

## 2020-05-08 MED ORDER — APIXABAN 2.5 MG PO TABS: 2.5000 mg | ORAL_TABLET | Freq: Two times a day (BID) | ORAL | 1 refills | Status: DC

## 2020-05-08 MED ORDER — ONDANSETRON 4 MG PO TBDP
4.0000 mg | ORAL_TABLET | Freq: Once | ORAL | Status: AC
  Administered 2020-05-08: 05:00:00 4 mg via ORAL
  Filled 2020-05-08: qty 1

## 2020-05-08 MED ORDER — ONDANSETRON 4 MG PO TBDP: 4.0000 mg | ORAL_TABLET | Freq: Four times a day (QID) | ORAL | 0 refills | Status: DC | PRN

## 2020-05-08 MED ORDER — OXYCODONE-ACETAMINOPHEN 5-325 MG PO TABS
2.0000 | ORAL_TABLET | Freq: Once | ORAL | Status: AC
  Administered 2020-05-08: 2 via ORAL
  Filled 2020-05-08: qty 2

## 2020-05-08 MED ORDER — DOCUSATE SODIUM 100 MG PO CAPS: 100.0000 mg | ORAL_CAPSULE | Freq: Two times a day (BID) | ORAL | 0 refills | Status: DC

## 2020-05-08 NOTE — Patient Instructions (Signed)
Social Worker Visit Information  Goals we discussed today:  Goals Addressed            This Visit's Progress   . Collaborate with RN Care Manager to perform appropriate assessments to assist with care coordination needs       CARE PLAN ENTRY (see longitudinal plan of care for additional care plan information)  Current Barriers:  . Limited social support . ADL IADL limitations . Lacks knowledge of community resource: SCAT  . Chronic conditions impacting ability to perform ADL and iADL needs including ESRD, HTN, and CHF . Frequent falls  Social Work Clinical Goal(s):  [name] Over the next 30 days the patient will work with SW to become more knowledgeable of transportation resources available to the patient  . Over the next 60 days the patient will work with SW to identify caregiver options to assist with ADL needs  CCM SW Interventions: Completed 05/08/20 . Inter-disciplinary care team collaboration (see longitudinal plan of care) . Collaboration with RN Care Manager who reports concern surrounding patient ability to perform ADL's and iADL's in the home o Discussed patient has dual coverage and may qualify to receive PCS services under Medicaid benefit o Initiated PCS application on behalf of the patient and submitted to the patient primary care provider for completion . Collaboration with RN Care Manager to discuss patient need for a transportation resource to provide door to door assistance o Discussed the patient is currently accessing [facility] o Reviewed the patient is approved for [place] reports patient concern surrounding reservation timing required with SCAT . Scheduled follow up call to the patient over the next 2 days to assist with care coordination needs  Patient Self Care Activities:  . Self administers medications as prescribed . Attends all scheduled provider appointments . Calls pharmacy for medication refills . Calls provider office for new  concerns or questions . Limited ability to perform ADL's and iADL's  Initial goal documentation         Follow Up Plan: SW will follow up with patient by phone over the next two days   [name], BSW, CDP Social Worker, Certified Dementia Practitioner [place] [facility] [phone]

## 2020-05-08 NOTE — Patient Instructions (Signed)
Visit Information  Goals Addressed      Patient Stated   . "I have been stumbling more frequently" (pt-stated)       Current Barriers:  [name] Knowledge Deficits related to evaluation and treatment of Gait disturbance and Impaired Physical Mobility  . Chronic Disease Management support and education needs related to ESRD, HTN, CHF  . ESRD on HD . Lacks needed caregiver assistance   Nurse Case Manager Clinical Goal(s):  [name] Over the next 30 days, patient will work with PCP to address needs related to evaluation and treatment of patient reported episodes of "stumbling" and impaired gait disturbance  Goal Met  . New 05/08/20 Over the next 90 days, patient will work with the [place] CM and PCP on disease education and support to help improve [place] related to Impaired Mobility and Gait Disturbance with falls   CCM RN CM Interventions:  05/08/20 call completed with patient  . Evaluation of current treatment plan related to Impaired gait with recent fall and ED visit  and patient's adherence to plan as established by provider . Determined patient experienced a fall 2 days ago while dining out at [employer] with a friend when she missed stepping down 3 steps and landed her on her knees . Determined patient went to the ED for evaluation of her injury's via EMS and was advised she sustained a sprain to her left ankle but no further injury's were identified, she was d/c home with a Rx for pain medication being sent to her pharmacy . Determined about 24 hours later, Ms. Rosene developed Sciatica pain on her left side and the pain was more than she could handle, she called EMS and was further evaluated in the [facility] without further injury identified, she was given pain medications and d/c home, she missed dialysis today  . Determined patient did not have transportation over the weekend to make it to her pharmacy, she currently does not have the pain medication prescribed, on hand . Determined patient  is not established with Orthopedics, however, her Sciatica pain is chronic and reoccurring if triggered . Discussed Ms. Tino completed PT in the past for this condition as well as for Vertigo and found it to be effective . Determined her balance continues to be impaired and she uses her walker for ambulation  . Determined Ms. Yearsley lives alone and has a daughter near by but she is limited on the assistance she can provide . Determined Ms. Ogburn has difficulty performing her Self Care independently as well as needing assistance to and from the car when using transportation for MD appts . Discussed she currently uses [employer] for transportation via [place], however, she does not receive assistance as needed . Determined she also has transportation via [place] but finds having to give a notice to be difficult and inconvenient . Determined Ms. Schiller completed an assessment per [employer] last year in hopes to be approved for [place] but was denied due to she had not experienced any falls prior to that time . Discussed the following plan with patient and she is agreeable: o 1. [place] referral to embedded Pharm D to review and assist patient with transferring her medications to a pharmacy who can deliver her medications (referral marked Urgent with efforts to help obtain patient's newly prescribed pain medication by end of business day) o 2. Send a BSW referral to [name] to assist with re-application for [place] and  review transportation options for patient in order to receive assistance needed to and from vehicle when needing to transport o Send in basket message to PCP provider [name], FNP requesting in home PT to work with patient on Gait and balance, strengthening and teaching stretches to help reduce Sciatica pain; Request a CNA with this service in order to help patient with personal hygiene and bathing; Request an Orthopedic referral for  ongoing management of degenerative disc and Sciatica pain  . Encouraged patient to rest and to use her walker at all time when ambulating and to take her pain medication exactly as prescribed . Encouraged patient to limit her fluid intake and to strictly adhere to her renal diet since missing a dialysis treatment today, she verbalizes understanding  . Discussed plans with patient for ongoing care management follow up and provided patient with direct contact information for care management team  Patient Self Care Activities:  . Self administers medications as prescribed . Attends all scheduled provider appointments . Calls pharmacy for medication refills . Calls provider office for new concerns or questions  Please see past updates related to this goal by clicking on the "Past Updates" button in the selected goal        Patient verbalizes understanding of instructions provided today.   Telephone follow up appointment with care management team member scheduled for: 05/09/20  [name], RN, BSN, CCM Care Management Coordinator [facility]  Direct Phone: [phone]

## 2020-05-08 NOTE — Chronic Care Management (AMB) (Signed)
Chronic Care Management    Social Work Follow Up Note  05/08/2020 Name: Whitney Walker MRN: [id number] DOB: 1956/04/27  Whitney Walker is a 64 y.o. year old female who is a primary care patient of [name], [place]. The CCM team was consulted for assistance with care coordination.   Review of patient status, including review of consultants reports, other relevant assessments, and collaboration with appropriate care team members and the patient's provider was performed as part of comprehensive patient evaluation and provision of chronic care management services.    SDOH (Social Determinants of Health) assessments performed: No    Outpatient Encounter Medications as of 05/08/2020  Medication Sig  . acetaminophen (TYLENOL) 500 MG tablet Take 1,000 mg by mouth 2 (two) times daily as needed for moderate pain or headache.  . albuterol (PROVENTIL HFA;VENTOLIN HFA) 108 (90 Base) MCG/ACT inhaler Inhale 2 puffs into the lungs every 6 (six) hours as needed for wheezing or shortness of breath.   [name] apixaban (ELIQUIS) 2.5 MG TABS tablet Take 1 tablet (2.5 mg total) by mouth 2 (two) times daily.  Lorin Picket 1 GM 210 MG(Fe) tablet   . Biotin 10000 MCG TABS Take by mouth.  . calcium citrate (CALCITRATE - DOSED IN MG ELEMENTAL CALCIUM) 950 MG tablet Take 200 mg of elemental calcium by mouth 3 (three) times daily. 2 tabs with dialysis on M,W,F  . carvedilol (COREG) 12.5 MG tablet Take 12.5 mg by mouth 2 (two) times daily with a meal. Only takes once a day on dialysis days (holds in morning on Monday Wednesday and Friday)   . docusate sodium (COLACE) 100 MG capsule Take 1 capsule (100 mg total) by mouth every 12 (twelve) hours. (Patient not taking: Reported on 05/08/2020)  . HYDROcodone-acetaminophen (NORCO/VICODIN) 5-325 MG tablet Take 1 tablet by mouth every 4 (four) hours as needed. (Patient not taking: Reported on 05/08/2020)  . meclizine (ANTIVERT) 12.5 MG tablet Take 12.5 mg by mouth 3 (three)  times daily as needed for dizziness.  . mometasone (ELOCON) 0.1 % cream Apply to affected area daily  . multivitamin (RENA-VIT) TABS tablet Take 1 tablet by mouth at bedtime.  . mupirocin ointment (BACTROBAN) 2 % Apply 1 application topically daily as needed (skin bumps).  . Nutritional Supplements (FEEDING SUPPLEMENT, NEPRO CARB STEADY,) LIQD Take 237 mLs by mouth 3 (three) times a week. During dialysis, Monday, Wednesday & Friday  . nystatin (NYSTATIN) powder Apply topically 4 (four) times daily.  . ondansetron (ZOFRAN ODT) 4 MG disintegrating tablet Take 1 tablet (4 mg total) by mouth every 6 (six) hours as needed.  [name] oxyCODONE-acetaminophen (PERCOCET/ROXICET) 5-325 MG tablet Take 2 tablets by mouth every 4 (four) hours as needed for severe pain.  . promethazine (PHENERGAN) 12.5 MG tablet Take 12.5 mg by mouth every 6 (six) hours as needed for nausea or vomiting.  . sevelamer carbonate (RENVELA) 800 MG tablet Take 4 tablets with each meal  . temazepam (RESTORIL) 15 MG capsule Take 1 capsule (15 mg total) by mouth at bedtime as needed for sleep.   No facility-administered encounter medications on file as of 05/08/2020.     Goals Addressed            This Visit's Progress   . Collaborate with RN Care Manager to perform appropriate assessments to assist with care coordination needs       CARE PLAN ENTRY (see longitudinal plan of care for additional care plan information)  Current Barriers:  . Limited social support .  ADL IADL limitations . Lacks knowledge of community resource: SCAT  . Chronic conditions impacting ability to perform ADL and iADL needs including ESRD, HTN, and CHF . Frequent falls  Social Work Clinical Goal(s):  [name] Over the next 30 days the patient will work with SW to become more knowledgeable of transportation resources available to the patient  . Over the next 60 days the patient will work with SW to identify caregiver options to assist with ADL needs  CCM SW  Interventions: Completed 05/08/20 . Inter-disciplinary care team collaboration (see longitudinal plan of care) . Collaboration with RN Care Manager who reports concern surrounding patient ability to perform ADL's and iADL's in the home o Discussed patient has dual coverage and may qualify to receive PCS services under Medicaid benefit o Initiated PCS application on behalf of the patient and submitted to the patient primary care provider for completion . Collaboration with RN Care Manager to discuss patient need for a transportation resource to provide door to door assistance o Discussed the patient is currently accessing [facility] o Reviewed the patient is approved for [place] reports patient concern surrounding reservation timing required with SCAT . Scheduled follow up call to the patient over the next 2 days to assist with care coordination needs  Patient Self Care Activities:  . Self administers medications as prescribed . Attends all scheduled provider appointments . Calls pharmacy for medication refills . Calls provider office for new concerns or questions . Limited ability to perform ADL's and iADL's  Initial goal documentation         Follow Up Plan: SW will follow up with patient by phone over the next two days.   [name], BSW, CDP Social Worker, Certified Dementia Practitioner [place] [facility] [phone]  Total time spent performing care coordination and/or care management activities with the patient by phone or face to face = 10 minutes.

## 2020-05-08 NOTE — ED Triage Notes (Signed)
Pt arrived by EMS for SOB. Pt was discharged from ED earlier today and missed her dialysis treatment today. Pt reports SOB started around 2030 and worsened on lying flat. Attempted to use home cpap wihtout improvement   Dialysis catheter to L chest

## 2020-05-08 NOTE — ED Notes (Signed)
Pt to xray

## 2020-05-08 NOTE — Discharge Instructions (Signed)
Your x-rays today have been reassuring.  You have no fractures or dislocation.  Pain medication has been sent to your local pharmacy.  We have also consulted case management and social work to help with any home health needs.  I encourage you to use your walker at all times to ambulate.  Please continue to go to dialysis as scheduled.  Please follow-up with your primary care physician for close outpatient follow-up.  I recommend calling in the morning to schedule an appointment.

## 2020-05-08 NOTE — ED Triage Notes (Signed)
Pt brought by [facility] from home after she fall on a restaurant, she was seen on the ED and sent home, pt unable to get the pain pills from pharmacy, here for pain control.

## 2020-05-08 NOTE — Chronic Care Management (AMB) (Signed)
Chronic Care Management   Follow Up Note   05/08/2020 Name: Whitney Walker MRN: [id number] DOB: 03-Oct-1956  Referred by: Whitney Brine, FNP Reason for referral : Chronic Care Management (FU RN Call - post ED )   Whitney Walker is a 64 y.o. year old female who is a primary care patient of Whitney Walker, Whitney Walker. The CCM team was consulted for assistance with chronic disease management and care coordination needs.    Review of patient status, including review of consultants reports, relevant laboratory and other test results, and collaboration with appropriate care team members and the patient's provider was performed as part of comprehensive patient evaluation and provision of chronic care management services.    SDOH (Social Determinants of Health) assessments performed: Yes See Care Plan activities for detailed interventions related to [facility])   Placed outbound CCM RN CM call to patient to f/u on her recent ED visits.     Outpatient Encounter Medications as of 05/08/2020  Medication Sig  . acetaminophen (TYLENOL) 500 MG tablet Take 1,000 mg by mouth 2 (two) times daily as needed for moderate pain or headache.  . albuterol (PROVENTIL HFA;VENTOLIN HFA) 108 (90 Base) MCG/ACT inhaler Inhale 2 puffs into the lungs every 6 (six) hours as needed for wheezing or shortness of breath.   [name] apixaban (ELIQUIS) 2.5 MG TABS tablet Take 1 tablet (2.5 mg total) by mouth 2 (two) times daily.  Whitney Walker 1 GM 210 MG(Fe) tablet   . Biotin 10000 MCG TABS Take by mouth.  . calcium citrate (CALCITRATE - DOSED IN MG ELEMENTAL CALCIUM) 950 MG tablet Take 200 mg of elemental calcium by mouth 3 (three) times daily. 2 tabs with dialysis on M,W,F  . carvedilol (COREG) 12.5 MG tablet Take 12.5 mg by mouth 2 (two) times daily with a meal. Only takes once a day on dialysis days (holds in morning on Monday Wednesday and Friday)   . docusate sodium (COLACE) 100 MG capsule Take 1 capsule (100 mg total) by mouth every  12 (twelve) hours. (Patient not taking: Reported on 05/08/2020)  . HYDROcodone-acetaminophen (NORCO/VICODIN) 5-325 MG tablet Take 1 tablet by mouth every 4 (four) hours as needed. (Patient not taking: Reported on 05/08/2020)  . meclizine (ANTIVERT) 12.5 MG tablet Take 12.5 mg by mouth 3 (three) times daily as needed for dizziness.  . mometasone (ELOCON) 0.1 % cream Apply to affected area daily  . multivitamin (RENA-VIT) TABS tablet Take 1 tablet by mouth at bedtime.  . mupirocin ointment (BACTROBAN) 2 % Apply 1 application topically daily as needed (skin bumps).  . Nutritional Supplements (FEEDING SUPPLEMENT, NEPRO CARB STEADY,) LIQD Take 237 mLs by mouth 3 (three) times a week. During dialysis, Monday, Wednesday & Friday  . nystatin (NYSTATIN) powder Apply topically 4 (four) times daily.  . ondansetron (ZOFRAN ODT) 4 MG disintegrating tablet Take 1 tablet (4 mg total) by mouth every 6 (six) hours as needed.  [name] oxyCODONE-acetaminophen (PERCOCET/ROXICET) 5-325 MG tablet Take 2 tablets by mouth every 4 (four) hours as needed for severe pain.  . promethazine (PHENERGAN) 12.5 MG tablet Take 12.5 mg by mouth every 6 (six) hours as needed for nausea or vomiting.  . sevelamer carbonate (RENVELA) 800 MG tablet Take 4 tablets with each meal  . temazepam (RESTORIL) 15 MG capsule Take 1 capsule (15 mg total) by mouth at bedtime as needed for sleep.   No facility-administered encounter medications on file as of 05/08/2020.     Objective:  Lab Results  Component Value Date   HGBA1C 5.1 06/04/2018   HGBA1C (H) 09/07/2009    6.5 (NOTE) The ADA recommends the following therapeutic goal for glycemic control related to Hgb A1c measurement: Goal of therapy: <6.5 Hgb A1c  Reference: American Diabetes Association: Clinical Practice Recommendations 2010, Diabetes Care, 2010, 33: (Suppl  1).   Lab Results  Component Value Date   LDLCALC 97 06/04/2018   CREATININE 8.25 (HH) 11/18/2019   BP Readings from Last 3  Encounters:  05/08/20 (!) 169/92  05/07/20 (!) 170/95  05/04/20 134/82    Goals Addressed      Patient Stated   . "I have been stumbling more frequently" (pt-stated)       Current Barriers:  [name] Knowledge Deficits related to evaluation and treatment of Gait disturbance and Impaired Physical Mobility  . Chronic Disease Management support and education needs related to ESRD, HTN, CHF  . ESRD on HD . Lacks needed caregiver assistance   Nurse Case Manager Clinical Goal(s):  [name] Over the next 30 days, patient will work with PCP to address needs related to evaluation and treatment of patient reported episodes of "stumbling" and impaired gait disturbance  Goal Met  . New 05/08/20 Over the next 90 days, patient will work with the [place] CM and PCP on disease education and support to help improve [place] related to Impaired Mobility and Gait Disturbance with falls   CCM RN CM Interventions:  05/08/20 call completed with patient  . Evaluation of current treatment plan related to Impaired gait with recent fall and ED visit  and patient's adherence to plan as established by provider . Determined patient experienced a fall 2 days ago while dining out at [employer] with a friend when she missed stepping down 3 steps and landed her on her knees . Determined patient went to the ED for evaluation of her injury's via EMS and was advised she sustained a sprain to her left ankle but no further injury's were identified, she was d/c home with a Rx for pain medication being sent to her pharmacy . Determined about 24 hours later, Ms. Walker developed Sciatica pain on her left side and the pain was more than she could handle, she called EMS and was further evaluated in the [facility] without further injury identified, she was given pain medications and d/c home, she missed dialysis today  . Determined patient did not have transportation over the weekend to make it to her pharmacy, she currently does not have the  pain medication prescribed, on hand . Determined patient is not established with Orthopedics, however, her Sciatica pain is chronic and reoccurring if triggered . Discussed Ms. Negrette completed PT in the past for this condition as well as for Vertigo and found it to be effective . Determined her balance continues to be impaired and she uses her walker for ambulation  . Determined Ms. Reinhardt lives alone and has a daughter near by but she is limited on the assistance she can provide . Determined Ms. Segall has difficulty performing her Self Care independently as well as needing assistance to and from the car when using transportation for MD appts . Discussed she currently uses [employer] for transportation via [place], however, she does not receive assistance as needed . Determined she also has transportation via [place] but finds having to give a notice to be difficult and inconvenient . Determined Ms. Bady completed an assessment per [employer] last year in hopes to be  approved for [place] but was denied due to she had not experienced any falls prior to that time . Discussed the following plan with patient and she is agreeable: o 1. [place] referral to embedded Pharm D to review and assist patient with transferring her medications to a pharmacy who can deliver her medications (referral marked Urgent with efforts to help obtain patient's newly prescribed pain medication by end of business day) o 2. Send a BSW referral to [name] to assist with re-application for [place] and review transportation options for patient in order to receive assistance needed to and from vehicle when needing to transport o Send in basket message to PCP provider Whitney Brine, FNP requesting in home PT to work with patient on Gait and balance, strengthening and teaching stretches to help reduce Sciatica pain; Request a CNA with this service in order to help patient with  personal hygiene and bathing; Request an Orthopedic referral for ongoing management of degenerative disc and Sciatica pain  . Encouraged patient to rest and to use her walker at all time when ambulating and to take her pain medication exactly as prescribed . Encouraged patient to limit her fluid intake and to strictly adhere to her renal diet since missing a dialysis treatment today, she verbalizes understanding  . Discussed plans with patient for ongoing care management follow up and provided patient with direct contact information for care management team  Patient Self Care Activities:  . Self administers medications as prescribed . Attends all scheduled provider appointments . Calls pharmacy for medication refills . Calls provider office for new concerns or questions  Please see past updates related to this goal by clicking on the "Past Updates" button in the selected goal        Plan:   Telephone follow up appointment with care management team member scheduled for: 05/09/20  [name], RN, BSN, CCM Care Management Coordinator [facility]  Direct Phone: [phone]

## 2020-05-08 NOTE — Chronic Care Management (AMB) (Signed)
  Chronic Care Management   Note  05/08/2020 Name: Whitney Walker MRN: [id number] DOB: [birth year]  Whitney Walker is a 64 y.o. year old female who is a primary care patient of Whitney Walker, [place]. I reached out to Whitney Walker by phone today in response to a referral sent by Whitney Walker's PCP, Whitney Brine, FNP.   Whitney Walker was given information about Chronic Care Management services today including:  1. CCM service includes personalized support from designated clinical staff supervised by her physician, including individualized plan of care and coordination with other care providers 2. 24/7 contact phone numbers for assistance for urgent and routine care needs. 3. Service will only be billed when office clinical staff spend 20 minutes or more in a month to coordinate care. 4. Only one practitioner may furnish and bill the service in a calendar month. 5. The patient may stop CCM services at any time (effective at the end of the month) by phone call to the office staff.   Patient agreed to services and verbal consent obtained.    Medication Management   Pt uses [facility] for all medications Uses pill box? Yes  We discussed:  -Patient would like to transfer to a pharmacy that offers delivery  Plan Utilize [facility] for medication synchronization, packaging and delivery  Verbal consent obtained for [facility] enhanced pharmacy services (medication synchronization, adherence packaging, delivery coordination). A medication sync plan was created to allow patient to get all medications delivered once every 30 to 90 days per patient preference. Patient understands they have freedom to choose pharmacy and clinical pharmacist will coordinate care between all prescribers and [facility].  Follow up plan: Initial telephone visit scheduled for 05/25/20 at [place], PharmD Clinical Pharmacist [facility] [phone]

## 2020-05-08 NOTE — ED Provider Notes (Addendum)
TIME SEEN: 3:21 AM  CHIEF COMPLAINT: fall  HPI: Patient is a 64 year old female who presents to the emergency department with complaints of continued lower extremity pain, back pain after she had a fall earlier today.  Was seen in the emergency department and had x-rays of bilateral knees and left ankle which showed no acute abnormality.  Discharged with Vicodin.  States that pain increased tonight and she was unable to walk using her walker.  States most of the pain now is in her back and right hip and radiates into the posterior right leg.  No other new injury.  No numbness or weakness.  ROS: See HPI Constitutional: no fever  Eyes: no drainage  ENT: no runny nose   Cardiovascular:  no chest pain  Resp: no SOB  GI: no vomiting GU: no dysuria Integumentary: no rash  Allergy: no hives  Musculoskeletal: no leg swelling  Neurological: no slurred speech ROS otherwise negative  PAST MEDICAL HISTORY/PAST SURGICAL HISTORY:  Past Medical History:  Diagnosis Date  . Anemia   . Anxiety   . CHF (congestive heart failure) ([place])   . Colon cancer [facility])    treatment surgery  . Complication of anesthesia    after first C- [place] "couldnt walk after", patient denies having a spinal  . COPD (chronic obstructive pulmonary disease) ([place])   . Coronary artery disease   . Depression   . DVT (deep venous thrombosis) ([place])   . ESRD (end stage renal disease) ([place])    Hemo: MWF  . History of blood transfusion 04/2018  . Hypertension    07/07/18- no longer takes BP medications  . Meningitis   . Pain in limb 07/30/2013  . PE (pulmonary embolism)   . Peripheral vascular disease ([place])   . Restless legs   . Shortness of breath    with exertion  . Sleep apnea   . SOB (shortness of breath) 03/03/2019  . Vertigo     MEDICATIONS:  Prior to Admission medications   Medication Sig Start Date End Date Taking? Authorizing Provider  acetaminophen (TYLENOL) 500 MG tablet Take 1,000 mg by mouth 2 (two) times  daily as needed for moderate pain or headache.    [provider]  albuterol (PROVENTIL HFA;VENTOLIN HFA) 108 (90 Base) MCG/ACT inhaler Inhale 2 puffs into the lungs every 6 (six) hours as needed for wheezing or shortness of breath.     [provider]  AURYXIA 1 GM 210 MG(Fe) tablet  11/01/19   [provider]  Biotin 10000 MCG TABS Take by mouth.    [provider]  calcium citrate (CALCITRATE - DOSED IN MG ELEMENTAL CALCIUM) 950 MG tablet Take 200 mg of elemental calcium by mouth 3 (three) times daily. 2 tabs with dialysis on M,W,F    Edrick Oh, MD  carvedilol (COREG) 6.25 MG tablet Take 12.5 mg by mouth 2 (two) times daily with a meal. Only takes once a day on dialysis days (holds in morning on Monday Wednesday and Friday)     [provider]  ELIQUIS 2.5 MG TABS tablet TAKE ONE TABLET BY MOUTH TWICE A DAY 04/11/20   Truitt Merle, MD  HYDROcodone-acetaminophen (NORCO/VICODIN) 5-325 MG tablet Take 1 tablet by mouth every 4 (four) hours as needed. 05/07/20   Recardo Evangelist, PA-C  meclizine (ANTIVERT) 12.5 MG tablet Take 12.5 mg by mouth 3 (three) times daily as needed for dizziness.    [provider]  mometasone (ELOCON) 0.1 % cream Apply  to affected area daily 07/20/19 07/19/20  [name], FNP  multivitamin (RENA-VIT) TABS tablet Take 1 tablet by mouth at bedtime.    Edrick Oh, MD  mupirocin ointment (BACTROBAN) 2 % Apply 1 application topically daily as needed (skin bumps). 04/15/19   [name], FNP  Nutritional Supplements (FEEDING SUPPLEMENT, NEPRO CARB STEADY,) LIQD Take 237 mLs by mouth 3 (three) times a week. During dialysis, Monday, Wednesday & Friday    Edrick Oh, MD  nystatin (NYSTATIN) powder Apply topically 4 (four) times daily. 10/26/19   [name], FNP  promethazine (PHENERGAN) 12.5 MG tablet Take 12.5 mg by mouth every 6 (six) hours as needed for nausea or vomiting.    [provider]  sevelamer carbonate  (RENVELA) 800 MG tablet Take 4 tablets with each meal 10/20/19   [provider]  temazepam (RESTORIL) 15 MG capsule Take 1 capsule (15 mg total) by mouth at bedtime as needed for sleep. 05/04/20   [name], FNP    ALLERGIES:  Allergies  Allergen Reactions  . Carnosine     Other reaction(s): Unknown  . Gadolinium Derivatives Hives and Other (See Comments)    HIVES, Desc: HIVES W/ "DYE" USED FOR 1ST CT SCAN BUT NOT 2ND, NO PREMEDS USED, PT UNCERTAIN OF CIRCUMSTANCES,,?POSSIBLE MRI CONTRAST ALLERGY, ALL STUDIES DONE "SOMEWHERE" IN [place]//A.C., Onset Date: [id number]  . Iohexol Other (See Comments)     Code: HIVES, Desc: HIVES W/ "DYE" USED FOR 1ST CT SCAN BUT NOT 2ND, NO PREMEDS USED, PT UNCERTAIN OF CIRCUMSTANCES,,?POSSIBLE MRI CONTRAST ALLERGY, ALL STUDIES DONE "SOMEWHERE" IN [place]//A.C., Onset Date: [id number]   . Iodine Hives  . Naltrexone     Other reaction(s): Unknown    SOCIAL HISTORY:  Social History   Tobacco Use  . Smoking status: Current Some Day Smoker    Packs/day: 0.00    Years: 40.00    Pack years: 0.00    Types: Cigarettes  . Smokeless tobacco: Former Systems developer  . Tobacco comment: has an occasional cigarette - mostly when around people  Substance Use Topics  . Alcohol use: No    Alcohol/week: 0.0 standard drinks    Comment: she used to drink alcohol, quit in 2010     FAMILY HISTORY: Family History  Problem Relation Age of Onset  . Cancer Mother 74       breast and bone  . Cancer Father 21       prostate  . Hypertension Sister   . Bleeding Disorder Sister   . Cancer Cousin 20       breast cancer   . Hypertension Daughter   . Breast cancer Neg Hx     EXAM: BP (!) 169/92 (BP Location: Right Arm)   Pulse 85   Temp 98.5 F (36.9 C) (Oral)   Resp 16   SpO2 100%  CONSTITUTIONAL: Alert and oriented and responds appropriately to questions.  Chronically ill-appearing, obese HEAD: Normocephalic EYES: Conjunctivae clear, pupils appear  equal, EOM appear intact ENT: normal nose; moist mucous membranes NECK: Supple, normal ROM CARD: RRR; S1 and S2 appreciated; no murmurs, no clicks, no rubs, no gallops RESP: Normal chest excursion without splinting or tachypnea; breath sounds clear and equal bilaterally; no wheezes, no rhonchi, no rales, no hypoxia or respiratory distress, speaking full sentences ABD/GI: Normal bowel sounds; non-distended; soft, non-tender, no rebound, no guarding, no peritoneal signs, no hepatosplenomegaly BACK:  The back appears normal but is tender to palpation over the lumbar spine without step-off or deformity EXT:  Normal ROM in all joints; no deformity noted, no edema; no cyanosis, no deformity or joint effusion noted to either knee and there is no bony tenderness on exam and she has full range of motion in these joints, patient has no significant deformity over the left ankle, she is tender over the right posterior lateral hip without leg length discrepancy SKIN: Normal color for age and race; warm; no rash on exposed skin NEURO: Moves all extremities equally, normal sensation diffusely, normal speech PSYCH: The patient's mood and manner are appropriate.   MEDICAL DECISION MAKING: Patient here with continued pain after her fall.  X-rays of both knees and left ankle have been reviewed and showed no acute abnormality.  We will x-ray her right hip and lumbar spine.  She states that she did not feel the Vicodin helped her pain.  Will give Percocet here.  ED PROGRESS: X-rays of the right hip and lumbar spine reviewed/interpreted and show no acute abnormality.  I suspect that she is having sciatica contributing to her pain.  She was sleeping comfortably and had to be aroused for reexamination.  She states she is still very worried about trying to get up and walk because of pain.  Patient seems very unwilling to attempt to try to go home.  She states she has concerned about not having a way to get home because she took  an ambulance here and she is still having pain.  Discussed with patient that we can contact PTAR.  She has a walker.  Will ambulate here and discussed with patient that pain medication has been sent to her pharmacy.  She is requesting help at home.  I have placed a case management and social work consultation for home health needs.  At this time I do not feel there is any indication for admission to the hospital.   6:20 AM  Pt witnessed ambulating down the hallway using a walker without any difficulty.  I feel she is safe to be discharged home.  Discussed again with patient that pain medication has been sent to her pharmacy.  I feel she is safe to go to dialysis in the morning.  Case management/social work has been consulted and can work with her as an outpatient for further home health needs.  At this time, I do not feel there is any life-threatening condition present. I have reviewed, interpreted and discussed all results (EKG, imaging, lab, urine as appropriate) and exam findings with patient/family. I have reviewed nursing notes and appropriate previous records.  I feel the patient is safe to be discharged home without further emergent workup and can continue workup as an outpatient as needed. Discussed usual and customary return precautions. Patient/family verbalize understanding and are comfortable with this plan.  Outpatient follow-up has been provided as needed. All questions have been answered.      Coraleigh Sheeran was evaluated in Emergency Department on 05/08/2020 for the symptoms described in the history of present illness. She was evaluated in the context of the global COVID-19 pandemic, which necessitated consideration that the patient might be at risk for infection with the SARS-CoV-2 virus that causes COVID-19. Institutional protocols and algorithms that pertain to the evaluation of patients at risk for COVID-19 are in a state of rapid change based on information released by regulatory  bodies including the CDC and federal and state organizations. These policies and algorithms were followed during the patient's care in the ED.     7:25 AM  Pt refused  PTAR and called [facility] to pick her up from the ED. It appears previous provider yesterday sent Vicodin prescription to [facility] on [address].  Patient now stating that she is unable to take Vicodin however she was provided that medication in the emergency department.  It does seem that she may have done better with Percocet here.  I have called the [facility] to cancel her previous prescription for hydrocodone and have written a new prescription for Percocet that has been electronically sent to her pharmacy.   Armando Bukhari, Delice Bison, DO 05/08/20 [id number]    Lakara Weiland, Delice Bison, DO 05/08/20 [id number]

## 2020-05-08 NOTE — ED Notes (Signed)
Back pain after a fall yesterday she was seen  Here in the ed   No pain relief from the pain med she was given

## 2020-05-08 NOTE — Chronic Care Management (AMB) (Signed)
  Care Management   Note  05/08/2020 Name: Whitney Walker MRN: [id number] DOB: 11-13-1956  Whitney Walker is a 64 y.o. year old female who is a primary care patient of [name], [place] and is actively engaged with the care management team. I reached out to Kyra Leyland by phone today to assist with scheduling an initial visit with the Pharmacist  Follow up plan: Telephone appointment with care management team member scheduled for:05/25/2020.  [place], [place] [id number] Direct Dial: [phone] [email or web address]: [email or web address]

## 2020-05-09 ENCOUNTER — Telehealth: Payer: Self-pay

## 2020-05-09 ENCOUNTER — Encounter (HOSPITAL_COMMUNITY): Payer: Self-pay | Admitting: Internal Medicine

## 2020-05-09 ENCOUNTER — Ambulatory Visit: Payer: Medicare Other | Admitting: Psychology

## 2020-05-09 ENCOUNTER — Ambulatory Visit: Payer: Self-pay

## 2020-05-09 ENCOUNTER — Emergency Department (HOSPITAL_COMMUNITY): Payer: Medicare Other

## 2020-05-09 ENCOUNTER — Other Ambulatory Visit: Payer: Self-pay

## 2020-05-09 DIAGNOSIS — G2581 Restless legs syndrome: Secondary | ICD-10-CM | POA: Diagnosis present

## 2020-05-09 DIAGNOSIS — J9601 Acute respiratory failure with hypoxia: Secondary | ICD-10-CM | POA: Diagnosis present

## 2020-05-09 DIAGNOSIS — I440 Atrioventricular block, first degree: Secondary | ICD-10-CM | POA: Diagnosis present

## 2020-05-09 DIAGNOSIS — N186 End stage renal disease: Secondary | ICD-10-CM | POA: Diagnosis not present

## 2020-05-09 DIAGNOSIS — R0602 Shortness of breath: Secondary | ICD-10-CM | POA: Diagnosis not present

## 2020-05-09 DIAGNOSIS — I739 Peripheral vascular disease, unspecified: Secondary | ICD-10-CM | POA: Diagnosis present

## 2020-05-09 DIAGNOSIS — E875 Hyperkalemia: Secondary | ICD-10-CM

## 2020-05-09 DIAGNOSIS — D631 Anemia in chronic kidney disease: Secondary | ICD-10-CM | POA: Diagnosis not present

## 2020-05-09 DIAGNOSIS — D509 Iron deficiency anemia, unspecified: Secondary | ICD-10-CM | POA: Diagnosis not present

## 2020-05-09 DIAGNOSIS — I161 Hypertensive emergency: Secondary | ICD-10-CM | POA: Diagnosis present

## 2020-05-09 DIAGNOSIS — Z85038 Personal history of other malignant neoplasm of large intestine: Secondary | ICD-10-CM | POA: Diagnosis not present

## 2020-05-09 DIAGNOSIS — M79605 Pain in left leg: Secondary | ICD-10-CM

## 2020-05-09 DIAGNOSIS — D689 Coagulation defect, unspecified: Secondary | ICD-10-CM | POA: Diagnosis not present

## 2020-05-09 DIAGNOSIS — I5032 Chronic diastolic (congestive) heart failure: Secondary | ICD-10-CM | POA: Diagnosis not present

## 2020-05-09 DIAGNOSIS — Z20822 Contact with and (suspected) exposure to covid-19: Secondary | ICD-10-CM | POA: Diagnosis present

## 2020-05-09 DIAGNOSIS — G4733 Obstructive sleep apnea (adult) (pediatric): Secondary | ICD-10-CM | POA: Diagnosis present

## 2020-05-09 DIAGNOSIS — Z91041 Radiographic dye allergy status: Secondary | ICD-10-CM | POA: Diagnosis not present

## 2020-05-09 DIAGNOSIS — Z992 Dependence on renal dialysis: Secondary | ICD-10-CM | POA: Diagnosis not present

## 2020-05-09 DIAGNOSIS — Z86718 Personal history of other venous thrombosis and embolism: Secondary | ICD-10-CM | POA: Diagnosis not present

## 2020-05-09 DIAGNOSIS — E669 Obesity, unspecified: Secondary | ICD-10-CM | POA: Diagnosis present

## 2020-05-09 DIAGNOSIS — Z7901 Long term (current) use of anticoagulants: Secondary | ICD-10-CM | POA: Diagnosis not present

## 2020-05-09 DIAGNOSIS — Z86711 Personal history of pulmonary embolism: Secondary | ICD-10-CM | POA: Diagnosis not present

## 2020-05-09 DIAGNOSIS — F17210 Nicotine dependence, cigarettes, uncomplicated: Secondary | ICD-10-CM | POA: Diagnosis present

## 2020-05-09 DIAGNOSIS — I2510 Atherosclerotic heart disease of native coronary artery without angina pectoris: Secondary | ICD-10-CM | POA: Diagnosis not present

## 2020-05-09 DIAGNOSIS — N2581 Secondary hyperparathyroidism of renal origin: Secondary | ICD-10-CM | POA: Diagnosis not present

## 2020-05-09 DIAGNOSIS — Z23 Encounter for immunization: Secondary | ICD-10-CM | POA: Diagnosis not present

## 2020-05-09 DIAGNOSIS — J449 Chronic obstructive pulmonary disease, unspecified: Secondary | ICD-10-CM | POA: Diagnosis present

## 2020-05-09 DIAGNOSIS — I132 Hypertensive heart and chronic kidney disease with heart failure and with stage 5 chronic kidney disease, or end stage renal disease: Secondary | ICD-10-CM | POA: Diagnosis not present

## 2020-05-09 DIAGNOSIS — Z888 Allergy status to other drugs, medicaments and biological substances status: Secondary | ICD-10-CM | POA: Diagnosis not present

## 2020-05-09 DIAGNOSIS — I509 Heart failure, unspecified: Secondary | ICD-10-CM | POA: Diagnosis not present

## 2020-05-09 DIAGNOSIS — I10 Essential (primary) hypertension: Secondary | ICD-10-CM | POA: Diagnosis not present

## 2020-05-09 HISTORY — DX: Pain in left leg: M79.605

## 2020-05-09 LAB — BASIC METABOLIC PANEL
Anion gap: 14 (ref 5–15)
Anion gap: 16 — ABNORMAL HIGH (ref 5–15)
BUN: 19 mg/dL (ref 8–23)
BUN: 83 mg/dL — ABNORMAL HIGH (ref 8–23)
CO2: 19 mmol/L — ABNORMAL LOW (ref 22–32)
CO2: 26 mmol/L (ref 22–32)
Calcium: 9.1 mg/dL (ref 8.9–10.3)
Calcium: 9.6 mg/dL (ref 8.9–10.3)
Chloride: 95 mmol/L — ABNORMAL LOW (ref 98–111)
Chloride: 96 mmol/L — ABNORMAL LOW (ref 98–111)
Creatinine, Ser: 13.2 mg/dL — ABNORMAL HIGH (ref 0.44–1.00)
Creatinine, Ser: 5.8 mg/dL — ABNORMAL HIGH (ref 0.44–1.00)
GFR calc Af Amer: 3 mL/min — ABNORMAL LOW (ref >60)
GFR calc Af Amer: 8 mL/min — ABNORMAL LOW (ref >60)
GFR calc non Af Amer: 3 mL/min — ABNORMAL LOW (ref >60)
GFR calc non Af Amer: 7 mL/min — ABNORMAL LOW (ref >60)
Glucose, Bld: 117 mg/dL — ABNORMAL HIGH (ref 70–99)
Glucose, Bld: 71 mg/dL (ref 70–99)
Potassium: 4.8 mmol/L (ref 3.5–5.1)
Potassium: >7.5 mmol/L (ref 3.5–5.1)
Sodium: 130 mmol/L — ABNORMAL LOW (ref 135–145)
Sodium: 136 mmol/L (ref 135–145)

## 2020-05-09 LAB — CBC
HCT: 35.2 % — ABNORMAL LOW (ref 36.0–46.0)
Hemoglobin: 11.1 g/dL — ABNORMAL LOW (ref 12.0–15.0)
MCH: 29.4 pg (ref 26.0–34.0)
MCHC: 31.5 g/dL (ref 30.0–36.0)
MCV: 93.1 fL (ref 80.0–100.0)
Platelets: 221 10*3/uL (ref 150–400)
RBC: 3.78 MIL/uL — ABNORMAL LOW (ref 3.87–5.11)
RDW: 16.1 % — ABNORMAL HIGH (ref 11.5–15.5)
WBC: 12.3 10*3/uL — ABNORMAL HIGH (ref 4.0–10.5)
nRBC: 0 % (ref 0.0–0.2)

## 2020-05-09 LAB — CBC WITH DIFFERENTIAL/PLATELET
Abs Immature Granulocytes: 0.04 10*3/uL (ref 0.00–0.07)
Basophils Absolute: 0 10*3/uL (ref 0.0–0.1)
Basophils Relative: 0 %
Eosinophils Absolute: 0.1 10*3/uL (ref 0.0–0.5)
Eosinophils Relative: 1 %
HCT: 35.9 % — ABNORMAL LOW (ref 36.0–46.0)
Hemoglobin: 11.3 g/dL — ABNORMAL LOW (ref 12.0–15.0)
Immature Granulocytes: 0 %
Lymphocytes Relative: 14 %
Lymphs Abs: 1.8 10*3/uL (ref 0.7–4.0)
MCH: 29.7 pg (ref 26.0–34.0)
MCHC: 31.5 g/dL (ref 30.0–36.0)
MCV: 94.2 fL (ref 80.0–100.0)
Monocytes Absolute: 0.7 10*3/uL (ref 0.1–1.0)
Monocytes Relative: 5 %
Neutro Abs: 10.3 10*3/uL — ABNORMAL HIGH (ref 1.7–7.7)
Neutrophils Relative %: 80 %
Platelets: 250 10*3/uL (ref 150–400)
RBC: 3.81 MIL/uL — ABNORMAL LOW (ref 3.87–5.11)
RDW: 16.1 % — ABNORMAL HIGH (ref 11.5–15.5)
WBC: 13 10*3/uL — ABNORMAL HIGH (ref 4.0–10.5)
nRBC: 0 % (ref 0.0–0.2)

## 2020-05-09 LAB — CBG MONITORING, ED
Glucose-Capillary: 102 mg/dL — ABNORMAL HIGH (ref 70–99)
Glucose-Capillary: 94 mg/dL (ref 70–99)

## 2020-05-09 LAB — RENAL FUNCTION PANEL
Albumin: 3 g/dL — ABNORMAL LOW (ref 3.5–5.0)
Anion gap: 15 (ref 5–15)
BUN: 85 mg/dL — ABNORMAL HIGH (ref 8–23)
CO2: 20 mmol/L — ABNORMAL LOW (ref 22–32)
Calcium: 9.3 mg/dL (ref 8.9–10.3)
Chloride: 98 mmol/L (ref 98–111)
Creatinine, Ser: 13.59 mg/dL — ABNORMAL HIGH (ref 0.44–1.00)
GFR calc Af Amer: 3 mL/min — ABNORMAL LOW (ref >60)
GFR calc non Af Amer: 3 mL/min — ABNORMAL LOW (ref >60)
Glucose, Bld: 101 mg/dL — ABNORMAL HIGH (ref 70–99)
Phosphorus: 8.9 mg/dL — ABNORMAL HIGH (ref 2.5–4.6)
Potassium: >7.5 mmol/L (ref 3.5–5.1)
Sodium: 133 mmol/L — ABNORMAL LOW (ref 135–145)

## 2020-05-09 LAB — SARS CORONAVIRUS 2 BY RT PCR (HOSPITAL ORDER, PERFORMED IN ~~LOC~~ HOSPITAL LAB): SARS Coronavirus 2: NEGATIVE

## 2020-05-09 LAB — HIV ANTIBODY (ROUTINE TESTING W REFLEX): HIV Screen 4th Generation wRfx: NONREACTIVE

## 2020-05-09 IMAGING — DX DG CHEST 1V PORT
1 series · 1 of 1 positions shown · non-contrast
Comparison: [DATE]

CLINICAL DATA: Shortness of breath

EXAM:
PORTABLE CHEST 1 VIEW

[chest ap]
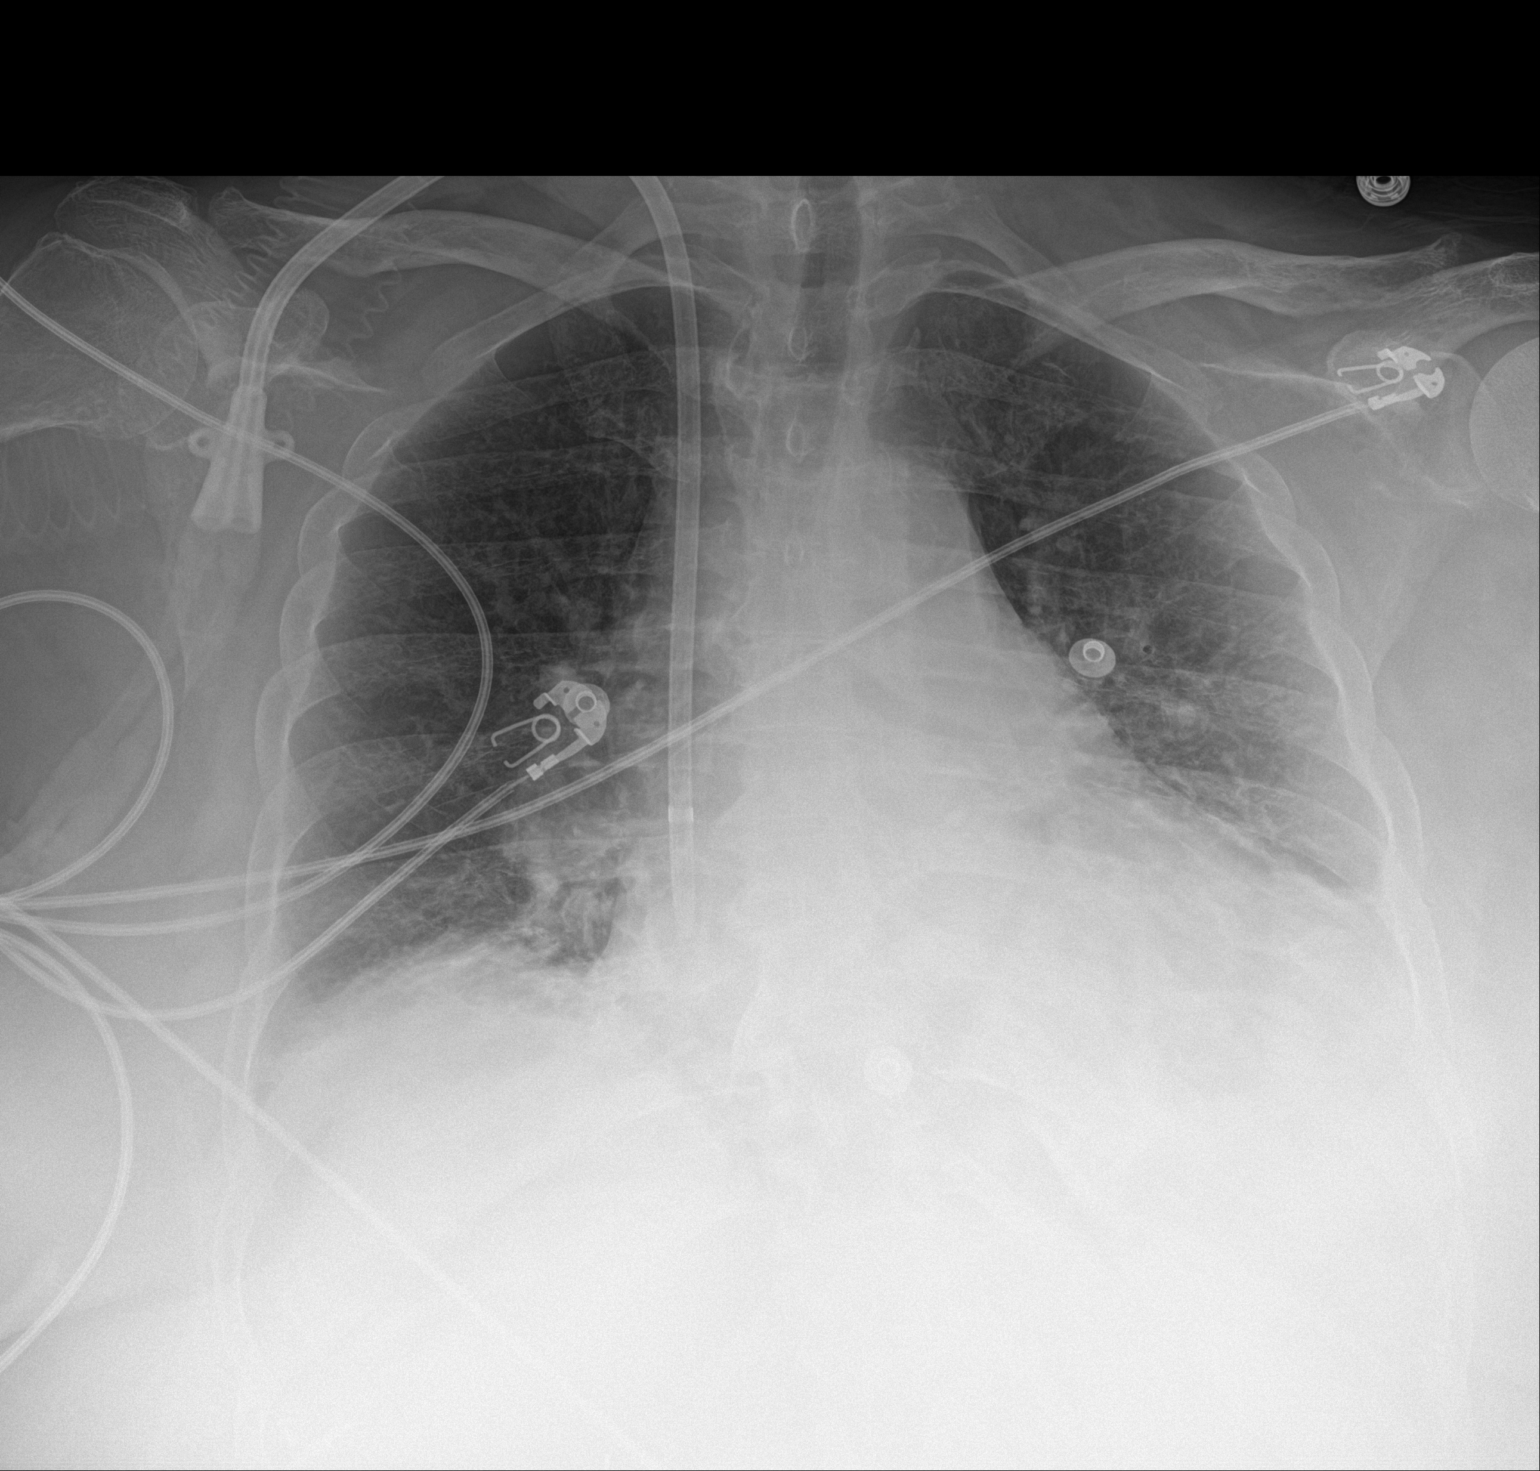

[1 of 1 positions shown; findings below may reference images not displayed]

FINDINGS: Again noted is cardiomegaly. Small bilateral pleural effusions are
present. No large airspace consolidation is seen. A right-sided
central venous catheter is seen with the tip at the superior
cavoatrial junction. No acute osseous abnormality.
IMPRESSION: Cardiomegaly and small bilateral pleural effusions.

## 2020-05-09 MED ORDER — CALCIUM GLUCONATE-NACL 1-0.675 GM/50ML-% IV SOLN
1.0000 g | Freq: Once | INTRAVENOUS | Status: AC
  Administered 2020-05-09: 1000 mg via INTRAVENOUS
  Filled 2020-05-09: qty 50

## 2020-05-09 MED ORDER — APIXABAN 2.5 MG PO TABS
2.5000 mg | ORAL_TABLET | Freq: Two times a day (BID) | ORAL | Status: DC
  Administered 2020-05-09 – 2020-05-10 (×3): 2.5 mg via ORAL
  Filled 2020-05-09 (×4): qty 1

## 2020-05-09 MED ORDER — CHLORHEXIDINE GLUCONATE CLOTH 2 % EX PADS
6.0000 | MEDICATED_PAD | Freq: Every day | CUTANEOUS | Status: DC
  Administered 2020-05-10: 6 via TOPICAL

## 2020-05-09 MED ORDER — OXYCODONE-ACETAMINOPHEN 5-325 MG PO TABS: 1.0000 | ORAL_TABLET | ORAL | Status: DC | PRN

## 2020-05-09 MED ORDER — CALCIUM CITRATE 950 (200 CA) MG PO TABS
400.0000 mg | ORAL_TABLET | ORAL | Status: DC
  Filled 2020-05-09: qty 2

## 2020-05-09 MED ORDER — SODIUM CHLORIDE 0.9 % IV SOLN: 100.0000 mL | INTRAVENOUS | Status: DC | PRN

## 2020-05-09 MED ORDER — DEXTROSE 50 % IV SOLN
1.0000 | Freq: Once | INTRAVENOUS | Status: AC
  Administered 2020-05-09: 50 mL via INTRAVENOUS
  Filled 2020-05-09: qty 50

## 2020-05-09 MED ORDER — CALCIUM GLUCONATE 10 % IV SOLN
1.0000 g | Freq: Once | INTRAVENOUS | Status: DC
  Filled 2020-05-09: qty 10

## 2020-05-09 MED ORDER — INSULIN ASPART 100 UNIT/ML IV SOLN
10.0000 [IU] | Freq: Once | INTRAVENOUS | Status: AC
  Administered 2020-05-09: 10 [IU] via INTRAVENOUS

## 2020-05-09 MED ORDER — CARVEDILOL 12.5 MG PO TABS: 12.5000 mg | ORAL_TABLET | ORAL | Status: DC

## 2020-05-09 MED ORDER — FUROSEMIDE 10 MG/ML IJ SOLN
40.0000 mg | Freq: Once | INTRAMUSCULAR | Status: AC
  Administered 2020-05-09: 40 mg via INTRAVENOUS
  Filled 2020-05-09: qty 4

## 2020-05-09 MED ORDER — HEPARIN SODIUM (PORCINE) 1000 UNIT/ML DIALYSIS
4000.0000 [IU] | INTRAMUSCULAR | Status: DC | PRN
  Administered 2020-05-09: 4000 [IU] via INTRAVENOUS_CENTRAL

## 2020-05-09 MED ORDER — NEPRO/CARBSTEADY PO LIQD
237.0000 mL | ORAL | Status: DC
  Filled 2020-05-09: qty 237

## 2020-05-09 MED ORDER — CARVEDILOL 12.5 MG PO TABS
12.5000 mg | ORAL_TABLET | ORAL | Status: DC
  Administered 2020-05-09 (×2): 12.5 mg via ORAL
  Filled 2020-05-09: qty 1

## 2020-05-09 MED ORDER — CARVEDILOL 12.5 MG PO TABS: 12.5000 mg | ORAL_TABLET | Freq: Two times a day (BID) | ORAL | Status: DC

## 2020-05-09 MED ORDER — SODIUM POLYSTYRENE SULFONATE 15 GM/60ML PO SUSP
45.0000 g | Freq: Once | ORAL | Status: AC
  Administered 2020-05-09: 45 g via ORAL
  Filled 2020-05-09: qty 180

## 2020-05-09 MED ORDER — ACETAMINOPHEN 325 MG PO TABS
650.0000 mg | ORAL_TABLET | Freq: Four times a day (QID) | ORAL | Status: DC | PRN
  Administered 2020-05-09 (×2): 650 mg via ORAL
  Filled 2020-05-09 (×2): qty 2

## 2020-05-09 MED ORDER — TEMAZEPAM 15 MG PO CAPS: 15.0000 mg | ORAL_CAPSULE | Freq: Every evening | ORAL | Status: DC | PRN

## 2020-05-09 MED ORDER — ALBUTEROL SULFATE (2.5 MG/3ML) 0.083% IN NEBU: 2.5000 mg | INHALATION_SOLUTION | Freq: Four times a day (QID) | RESPIRATORY_TRACT | Status: DC | PRN

## 2020-05-09 MED ORDER — CALCIUM CITRATE 950 (200 CA) MG PO TABS
200.0000 mg | ORAL_TABLET | Freq: Three times a day (TID) | ORAL | Status: DC
  Administered 2020-05-09: 200 mg via ORAL
  Filled 2020-05-09 (×5): qty 1

## 2020-05-09 MED ORDER — SEVELAMER CARBONATE 800 MG PO TABS
3200.0000 mg | ORAL_TABLET | Freq: Three times a day (TID) | ORAL | Status: DC
  Administered 2020-05-09 (×2): 3200 mg via ORAL
  Filled 2020-05-09 (×5): qty 4

## 2020-05-09 MED ORDER — ALTEPLASE 2 MG IJ SOLR: 2.0000 mg | Freq: Once | INTRAMUSCULAR | Status: DC | PRN

## 2020-05-09 MED ORDER — HEPARIN SODIUM (PORCINE) 1000 UNIT/ML DIALYSIS: 1000.0000 [IU] | INTRAMUSCULAR | Status: DC | PRN

## 2020-05-09 MED ORDER — RENA-VITE PO TABS
1.0000 | ORAL_TABLET | Freq: Every day | ORAL | Status: DC
  Administered 2020-05-09: 1 via ORAL
  Filled 2020-05-09 (×2): qty 1

## 2020-05-09 MED ORDER — NITROGLYCERIN IN D5W 200-5 MCG/ML-% IV SOLN
0.0000 ug/min | INTRAVENOUS | Status: DC
  Administered 2020-05-09: 5 ug/min via INTRAVENOUS
  Filled 2020-05-09 (×2): qty 250

## 2020-05-09 NOTE — ED Notes (Signed)
Lunch Tray Ordered 

## 2020-05-09 NOTE — ED Provider Notes (Signed)
[facility] EMERGENCY DEPARTMENT Provider Note   CSN: [id number] Arrival date & time: 05/08/20  2340     History Chief Complaint  Patient presents with  . Respiratory Distress  Level 5 caveat due to acuity of condition  Whitney Walker is a 64 y.o. female.  The history is provided by the patient.  Shortness of Breath Severity:  Severe Onset quality:  Sudden Timing:  Constant Progression:  Worsening Chronicity:  New Relieved by:  Nothing Exacerbated by: Lying flat. Patient presents in respiratory distress.  Patient has a history of end-stage renal disease, COPD, PE,CHF.  She was in the ER recently for a fall and pain and reports she missed her dialysis session earlier in the day due to the ER visit.  She now feels short of breath in particular when lying flat.     Past Medical History:  Diagnosis Date  . Anemia   . Anxiety   . CHF (congestive heart failure) ([place])   . Colon cancer [facility])    treatment surgery  . Complication of anesthesia    after first C- [place] "couldnt walk after", patient denies having a spinal  . COPD (chronic obstructive pulmonary disease) ([place])   . Coronary artery disease   . Depression   . DVT (deep venous thrombosis) ([place])   . ESRD (end stage renal disease) ([place])    Hemo: MWF  . History of blood transfusion 04/2018  . Hypertension    07/07/18- no longer takes BP medications  . Meningitis   . Pain in limb 07/30/2013  . PE (pulmonary embolism)   . Peripheral vascular disease ([place])   . Restless legs   . Shortness of breath    with exertion  . Sleep apnea   . SOB (shortness of breath) 03/03/2019  . Vertigo     Patient Active Problem List   Diagnosis Date Noted  . Hyperkalemia 05/09/2020  . Restless leg syndrome 09/19/2018  . OSA (obstructive sleep apnea) 04/29/2018  . ESRD on dialysis ([place]) 04/29/2018  . Dyspnea on exertion 03/12/2017  . Morbid (severe) obesity due to excess calories ([place]) 03/12/2017  . Anemia of  chronic kidney failure, stage 3 (moderate) 12/10/2016  . History of colon cancer 08/12/2016  . Mass of left breast on mammogram 12/10/2015  . HTN (hypertension) 10/18/2015  . Depression 10/18/2015  . Numbness and tingling of left arm and leg 01/24/2015  . PVD (peripheral vascular disease) with claudication ([place]) 03/23/2014  . PAD (peripheral artery disease) ([place]) 12/14/2013  . CKD (chronic kidney disease) stage 4, GFR 15-29 ml/min ([facility]) 11/15/2013  . Chronic diastolic heart failure ([place]) 11/15/2013  . Essential hypertension 11/15/2013  . Leucocytosis 04/28/2012    Past Surgical History:  Procedure Laterality Date  . ABDOMINAL AORTAGRAM N/A 07/26/2013   Procedure: ABDOMINAL Maxcine Ham;  Surgeon: [name] Ophir, MD;  Location: [facility] CATH LAB;  Service: Cardiovascular;  Laterality: N/A;  . ABDOMINAL HYSTERECTOMY    . AV FISTULA PLACEMENT Left 05/05/2018   Procedure: ARTERIOVENOUS (AV) FISTULA CREATION BRACHIOCEPHALIC;  Surgeon: Waynetta Sandy, MD;  Location: [facility];  Service: Vascular;  Laterality: Left;  . AV FISTULA PLACEMENT Left 07/16/2018   Procedure: ARTERIOVENOUS FISTULA CREATION;  Surgeon: Waynetta Sandy, MD;  Location: [facility];  Service: Vascular;  Laterality: Left;  . BASCILIC VEIN TRANSPOSITION Left 09/03/2018   Procedure: BASILIC VEIN TRANSPOSITION SECOND STAGE;  Surgeon: Waynetta Sandy, MD;  Location: [facility];  Service: Vascular;  Laterality: Left;  . BREAST  BIOPSY Left   . CESAREAN SECTION     X 3 [id number]  . CHOLECYSTECTOMY    . CHOLECYSTECTOMY  1980/s  . COLECTOMY  2010  . DIVERTICULOSIS SURGERY-2002  2012  . FEMORAL-POPLITEAL BYPASS GRAFT Left 08/13/2013   Procedure: BYPASS GRAFT FEMORAL-POPLITEAL ARTERY WITH NON-REVERSED SAPHANEOUS VEIN; ULTRASOUND GUIDED;  Surgeon: Mal [name], MD;  Location: [facility];  Service: Vascular;  Laterality: Left;  . INSERTION OF DIALYSIS CATHETER Right 04/30/2018   Procedure: INSERTION OF Right Internal Jugular DIALYSIS  CATHETER;  Surgeon: [name], MD;  Location: [facility];  Service: Vascular;  Laterality: Right;  . LOWER EXTREMITY ANGIOGRAM Left 07/26/2013   Procedure: LOWER EXTREMITY ANGIOGRAM;  Surgeon: [name] Silvis, MD;  Location[facility] CATH LAB;  Service: Cardiovascular;  Laterality: Left;     OB History   No obstetric history on file.     Family History  Problem Relation Age of Onset  . Cancer Mother 3       breast and bone  . Cancer Father 74       prostate  . Hypertension Sister   . Bleeding Disorder Sister   . Cancer Cousin 20       breast cancer   . Hypertension Daughter   . Breast cancer Neg Hx     Social History   Tobacco Use  . Smoking status: Current Some Day Smoker    Packs/day: 0.00    Years: 40.00    Pack years: 0.00    Types: Cigarettes  . Smokeless tobacco: Former Systems developer  . Tobacco comment: has an occasional cigarette - mostly when around people  Substance Use Topics  . Alcohol use: No    Alcohol/week: 0.0 standard drinks    Comment: she used to drink alcohol, quit in 2010   . Drug use: No    Home Medications Prior to Admission medications   Medication Sig Start Date End Date Taking? Authorizing Provider  acetaminophen (TYLENOL) 500 MG tablet Take 1,000 mg by mouth 2 (two) times daily as needed for moderate pain or headache.    [provider]  albuterol (PROVENTIL HFA;VENTOLIN HFA) 108 (90 Base) MCG/ACT inhaler Inhale 2 puffs into the lungs every 6 (six) hours as needed for wheezing or shortness of breath.     [provider]  apixaban (ELIQUIS) 2.5 MG TABS tablet Take 1 tablet (2.5 mg total) by mouth 2 (two) times daily. 05/08/20   Truitt Merle, MD  AURYXIA 1 GM 210 MG(Fe) tablet  11/01/19   [provider]  Biotin 10000 MCG TABS Take by mouth.    [provider]  calcium citrate (CALCITRATE - DOSED IN MG ELEMENTAL CALCIUM) 950 MG tablet Take 200 mg of elemental calcium by mouth 3 (three) times daily. 2 tabs with dialysis on M,W,F     Edrick Oh, MD  carvedilol (COREG) 12.5 MG tablet Take 12.5 mg by mouth 2 (two) times daily with a meal. Only takes once a day on dialysis days (holds in morning on Monday Wednesday and Friday)     [provider]  docusate sodium (COLACE) 100 MG capsule Take 1 capsule (100 mg total) by mouth every 12 (twelve) hours. Patient not taking: Reported on 05/08/2020 05/08/20   Ward, Delice Bison, DO  HYDROcodone-acetaminophen (NORCO/VICODIN) 5-325 MG tablet Take 1 tablet by mouth every 4 (four) hours as needed. Patient not taking: Reported on 05/08/2020 05/07/20   Recardo Evangelist, PA-C  meclizine (ANTIVERT) 12.5 MG tablet Take 12.5  mg by mouth 3 (three) times daily as needed for dizziness.    [provider]  mometasone (ELOCON) 0.1 % cream Apply to affected area daily 07/20/19 07/19/20  [name], FNP  multivitamin (RENA-VIT) TABS tablet Take 1 tablet by mouth at bedtime.    Edrick Oh, MD  mupirocin ointment (BACTROBAN) 2 % Apply 1 application topically daily as needed (skin bumps). 04/15/19   [name], FNP  Nutritional Supplements (FEEDING SUPPLEMENT, NEPRO CARB STEADY,) LIQD Take 237 mLs by mouth 3 (three) times a week. During dialysis, Monday, Wednesday & Friday    Edrick Oh, MD  nystatin (NYSTATIN) powder Apply topically 4 (four) times daily. 10/26/19   [name], FNP  ondansetron (ZOFRAN ODT) 4 MG disintegrating tablet Take 1 tablet (4 mg total) by mouth every 6 (six) hours as needed. 05/08/20   Ward, Delice Bison, DO  oxyCODONE-acetaminophen (PERCOCET/ROXICET) 5-325 MG tablet Take 2 tablets by mouth every 4 (four) hours as needed for severe pain. 05/08/20   Ward, Delice Bison, DO  promethazine (PHENERGAN) 12.5 MG tablet Take 12.5 mg by mouth every 6 (six) hours as needed for nausea or vomiting.    [provider]  sevelamer carbonate (RENVELA) 800 MG tablet Take 4 tablets with each meal 10/20/19   [provider]  temazepam (RESTORIL) 15 MG capsule Take 1  capsule (15 mg total) by mouth at bedtime as needed for sleep. 05/04/20   [name], FNP    Allergies    Carnosine, Gadolinium derivatives, Iohexol, Iodine, and Naltrexone  Review of Systems   Review of Systems  Unable to perform ROS: Acuity of condition  Respiratory: Positive for shortness of breath.     Physical Exam Updated Vital Signs BP (!) 169/76   Pulse 68   Temp 97.9 F (36.6 C) (Oral)   Resp 15   SpO2 100%   Physical Exam CONSTITUTIONAL: Ill-appearing, distress noted HEAD: Normocephalic/atraumatic EYES: EOMI/PERRL ENMT: Mucous membranes moist, BiPAP mask in place NECK: supple no meningeal signs SPINE/BACK:entire spine nontender CV: S1/S2 noted, no murmurs/rubs/gallops noted LUNGS: Tachypnea, crackles bilaterally ABDOMEN: soft, nontender NEURO: Pt is awake/alert/appropriate, moves all extremitiesx4.    EXTREMITIES: pulses normal/equal, full ROM, removable splint to left ankle, no deformities SKIN: warm, color normal PSYCH: Anxious  ED Results / Procedures / Treatments   Labs (all labs ordered are listed, but only abnormal results are displayed) Labs Reviewed  CBC WITH DIFFERENTIAL/PLATELET - Abnormal; Notable for the following components:      Result Value   WBC 13.0 (*)    RBC 3.81 (*)    Hemoglobin 11.3 (*)    HCT 35.9 (*)    RDW 16.1 (*)    Neutro Abs 10.3 (*)    All other components within normal limits  BASIC METABOLIC PANEL - Abnormal; Notable for the following components:   Sodium 130 (*)    Potassium >7.5 (*)    Chloride 95 (*)    CO2 19 (*)    Glucose, Bld 117 (*)    BUN 83 (*)    Creatinine, Ser 13.20 (*)    GFR calc non Af Amer 3 (*)    GFR calc Af Amer 3 (*)    Anion gap 16 (*)    All other components within normal limits  CBG MONITORING, ED - Abnormal; Notable for the following components:   Glucose-Capillary 102 (*)    All other components within normal limits  SARS CORONAVIRUS 2 BY RT PCR (HOSPITAL ORDER, [place]  [facility])    EKG EKG Interpretation  Date/Time:  Monday May 08 2020 23:39:01 EDT Ventricular Rate:  66 PR Interval:  254 QRS Duration: 120 QT Interval:  454 QTC Calculation: 475 R Axis:   -107 Text Interpretation: Sinus rhythm with 1st degree A-V block Right superior axis deviation Cannot rule out Anterior infarct , age undetermined Abnormal ECG Confirmed by Ripley Fraise [id number]) on 05/09/2020 12:26:20 AM   Radiology DG Lumbar Spine Complete  Result Date: 05/08/2020 CLINICAL DATA:  Fall. Pain. EXAM: LUMBAR SPINE - COMPLETE 4+ VIEW COMPARISON:  None. FINDINGS: Five non rib-bearing lumbar type vertebral bodies are present. Vertebral body heights and alignment are normal. Degenerative changes are noted in the lower lumbar facets. No acute or healing fractures are present. Atherosclerotic calcifications are present in the aorta and branch vessels. Dialysis catheter is in place. Surgical clips are present at the gallbladder fossa. The bowel gas pattern is normal. IMPRESSION: 1. Degenerative changes in the lower lumbar facets. 2. No acute or healing fractures. 3. Atherosclerosis. Electronically Signed   By: San Morelle M.D.   On: 05/08/2020 04:56   DG Ankle Complete Left  Result Date: 05/07/2020 CLINICAL DATA:  Trip and fall. Left ankle pain.  Initial encounter. EXAM: LEFT ANKLE COMPLETE - 3+ VIEW COMPARISON:  None. FINDINGS: There is no evidence of fracture, dislocation, or joint effusion. There is no evidence of arthropathy. Small plantar and dorsal calcaneal bone spurs incidentally noted. Soft tissues are unremarkable. IMPRESSION: No acute findings. Electronically Signed   By: Marlaine Hind M.D.   On: 05/07/2020 13:47   DG Chest Portable 1 View  Result Date: 05/09/2020 CLINICAL DATA:  Shortness of breath EXAM: PORTABLE CHEST 1 VIEW COMPARISON:  March 03, 2019 FINDINGS: Again noted is cardiomegaly. Small bilateral pleural effusions are present. No large airspace  consolidation is seen. A right-sided central venous catheter is seen with the tip at the superior cavoatrial junction. No acute osseous abnormality. IMPRESSION: Cardiomegaly and small bilateral pleural effusions. Electronically Signed   By: Prudencio Pair M.D.   On: 05/09/2020 00:44   DG Knee Complete 4 Views Left  Result Date: 05/07/2020 CLINICAL DATA:  Trip and fall. Left knee pain.  Initial encounter. EXAM: LEFT KNEE - COMPLETE 4+ VIEW COMPARISON:  None. FINDINGS: No evidence of fracture, dislocation, or joint effusion. No minimal degenerative spurring of medial and lateral compartments is seen without significant joint space narrowing. No other osseous abnormality identified. Peripheral arterial vascular calcification and surgical clips noted. IMPRESSION: No acute findings. Minimal degenerative spurring. Electronically Signed   By: Marlaine Hind M.D.   On: 05/07/2020 13:46   DG Knee Complete 4 Views Right  Result Date: 05/07/2020 CLINICAL DATA:  Trip and fall today. Right knee injury and pain. Initial encounter. EXAM: RIGHT KNEE - COMPLETE 4+ VIEW COMPARISON:  08/23/2008 [name] FINDINGS: No evidence of fracture, dislocation, or joint effusion. Mild tricompartment degenerative spurring appear similar and there is no significant joint space narrowing. No other bone lesions identified. Peripheral arterial vascular calcification is new since previous study in 2009. IMPRESSION: 1. No acute findings. 2. Mild tricompartmental osteoarthritis. Electronically Signed   By: Marlaine Hind M.D.   On: 05/07/2020 13:49   DG Hip Unilat W or Wo Pelvis 2-3 Views Right  Result Date: 05/08/2020 CLINICAL DATA:  Fall. Right hip pain. EXAM: DG HIP (WITH OR WITHOUT PELVIS) 2-3V RIGHT COMPARISON:  None. FINDINGS: Pelvis is unremarkable. Right hip is located. No acute or healing fracture is present.  Atherosclerotic changes are noted. IMPRESSION: 1. No acute or healing fracture. 2. Atherosclerosis. Electronically Signed    By: San Morelle M.D.   On: 05/08/2020 04:54    Procedures .Critical Care Performed by: Ripley Fraise, MD Authorized by: Ripley Fraise, MD   Critical care provider statement:    Critical care time (minutes):  45   Critical care start time:  05/09/2020 12:00 AM   Critical care end time:  05/09/2020 12:45 AM   Critical care time was exclusive of:  Separately billable procedures and treating other patients   Critical care was necessary to treat or prevent imminent or life-threatening deterioration of the following conditions:  Renal failure and respiratory failure   Critical care was time spent personally by me on the following activities:  Ordering and review of laboratory studies, ordering and performing treatments and interventions, ordering and review of radiographic studies, pulse oximetry, re-evaluation of patient's condition, review of old charts, examination of patient, discussions with consultants, development of treatment plan with patient or surrogate and obtaining history from patient or surrogate   I assumed direction of critical care for this patient from another provider in my specialty: no      Medications Ordered in ED Medications  nitroGLYCERIN 50 mg in dextrose 5 % 250 mL (0.2 mg/mL) infusion (20 mcg/min Intravenous Rate/Dose Change 05/09/20 0133)  calcium gluconate 1 g/ 50 mL sodium chloride IVPB (1,000 mg Intravenous New Bag/Given 05/09/20 0119)  furosemide (LASIX) injection 40 mg (40 mg Intravenous Given 05/09/20 0032)  insulin aspart (novoLOG) injection 10 Units (10 Units Intravenous Given 05/09/20 0114)    And  dextrose 50 % solution 50 mL (50 mLs Intravenous Given 05/09/20 0114)  sodium polystyrene (KAYEXALATE) 15 GM/60ML suspension 45 g (45 g Oral Given 05/09/20 0114)    ED Course  I have reviewed the triage vital signs and the nursing notes.  Pertinent labs & imaging results that were available during my care of the patient were reviewed by me and  considered in my medical decision making (see chart for details).    MDM Rules/Calculators/A&P                       This patient presents to the ED for concern of respiratory distress, this involves an extensive number of treatment options, and is a complaint that carries with it a high risk of complications and morbidity.  The differential diagnosis includes CHF, COPD, acute coronary syndrome, pulmonary edema, PE   Lab Tests:   I Ordered, reviewed, and interpreted labs, which included electrolytes, complete blood count  Medicines ordered:   I ordered medication calcium, insulin, Lasix, nitroglycerin, Kayexalate for hyperkalemia and hypertensive emergency  Imaging Studies ordered:   I ordered imaging studies which included chest x-ray   I independently visualized and interpreted imaging which showed cardiomegaly and effusions  Additional history obtained:    Previous records obtained and reviewed   Consultations Obtained:   I consulted nephrology and hospitalist and discussed lab and imaging findings  Reevaluation:  After the interventions stated above, I reevaluated the patient and found patient is improved  Critical Interventions:  . BiPAP for respiratory failure, calcium/insulin/Lasix/nitroglycerin for hyperkalemia and hypertensive emergency   Patient was seen soon after arrival to the room for respiratory distress.  She was already on BiPAP by the time I was evaluating her.  Patient was tachypneic and had crackles bilaterally but mental status was appropriate Patient was improving on BiPAP.  Due to hypertensive  emergency, IV nitroglycerin was ordered as well as Lasix as patient still reports producing urine Patient had generalized weakness, but was able to move all extremities.  Patient was then found to have severe hyperkalemia over 7.5 which likely contributed to her weakness.  I discussed this with Dr. Posey Pronto with nephrology.  He recommends calcium, insulin and  dextrose, and Kayexalate. Patient has been stabilized in the emergency department.  I discussed the case with Dr. Hal Hope who will admit the patient for the hospitalist Final Clinical Impression(s) / ED Diagnoses Final diagnoses:  Acute respiratory failure with hypoxia ([place])  Hyperkalemia    Rx / DC Orders ED Discharge Orders    None       Ripley Fraise, MD 05/09/20 0150

## 2020-05-09 NOTE — Plan of Care (Signed)
  Problem: Activity: Goal: Risk for activity intolerance will decrease Outcome: Progressing   

## 2020-05-09 NOTE — ED Provider Notes (Signed)
Patient resting comfortably, no acute distress and appears improved.  No dysrhythmias.  I have confirmed with Dr. Posey Pronto with nephrology that she will be next up for dialysis.    Ripley Fraise, MD 05/09/20 [phone]

## 2020-05-09 NOTE — Consult Note (Signed)
Reason for Consult: Hyperkalemia in patient with end-stage renal disease Referring Physician: Gean Birchwood MD [facility])  HPI:  64 year old African-American woman with past medical history significant for hypertension, congestive heart failure, coronary artery disease, chronic obstructive lung disease, obesity, obstructive sleep apnea on CPAP, history of colon cancer, anxiety/depression and end-stage renal disease on hemodialysis.  Was seen in the emergency room on Sunday night/early yesterday morning after she sustained an accidental fall earlier on Sunday at a restaurant injuring her left ankle and right knee and unfortunately missed dialysis yesterday.  Presented back to the emergency room with increasing shortness of breath that developed during the course of the day and was found to have a potassium of >7.5 on labs.  Chest x-ray without significant pulmonary edema.  Over the past week she attended all her treatments but signed off early each time.  Dialysis prescription: Monday/Wednesday/Friday, [facility], Optiflux 180, BFR 400, DFR AF 1.5, EDW 89.5 kg, 2K/2.0 calcium, UF profile #2.  Right IJ TDC.  Heparin 4000 unit bolus, Venofer 50 mg IV weekly, calcitriol 2.5 mcg p.o. 3 times weekly HD.  Past Medical History:  Diagnosis Date  . Anemia   . Anxiety   . CHF (congestive heart failure) ([place])   . Colon cancer [facility])    treatment surgery  . Complication of anesthesia    after first C- [place] "couldnt walk after", patient denies having a spinal  . COPD (chronic obstructive pulmonary disease) ([place])   . Coronary artery disease   . Depression   . DVT (deep venous thrombosis) ([place])   . ESRD (end stage renal disease) ([place])    Hemo: MWF  . History of blood transfusion 04/2018  . Hypertension    07/07/18- no longer takes BP medications  . Meningitis   . Pain in limb 07/30/2013  . PE (pulmonary embolism)   . Peripheral vascular disease ([place])   . Restless legs   . Shortness of  breath    with exertion  . Sleep apnea   . SOB (shortness of breath) 03/03/2019  . Vertigo     Past Surgical History:  Procedure Laterality Date  . ABDOMINAL AORTAGRAM N/A 07/26/2013   Procedure: ABDOMINAL Maxcine Ham;  Surgeon: [name] Los Ranchos, MD;  Location: [facility] CATH LAB;  Service: Cardiovascular;  Laterality: N/A;  . ABDOMINAL HYSTERECTOMY    . AV FISTULA PLACEMENT Left 05/05/2018   Procedure: ARTERIOVENOUS (AV) FISTULA CREATION BRACHIOCEPHALIC;  Surgeon: Waynetta Sandy, MD;  Location: [place];  Service: Vascular;  Laterality: Left;  . AV FISTULA PLACEMENT Left 07/16/2018   Procedure: ARTERIOVENOUS FISTULA CREATION;  Surgeon: Waynetta Sandy, MD;  Location: [place];  Service: Vascular;  Laterality: Left;  . BASCILIC VEIN TRANSPOSITION Left 09/03/2018   Procedure: BASILIC VEIN TRANSPOSITION SECOND STAGE;  Surgeon: Waynetta Sandy, MD;  Location: [place];  Service: Vascular;  Laterality: Left;  . BREAST BIOPSY Left   . CESAREAN SECTION     X 3 [id number]  . CHOLECYSTECTOMY    . CHOLECYSTECTOMY  1980/s  . COLECTOMY  2010  . DIVERTICULOSIS SURGERY-2002  2012  . FEMORAL-POPLITEAL BYPASS GRAFT Left 08/13/2013   Procedure: BYPASS GRAFT FEMORAL-POPLITEAL ARTERY WITH NON-REVERSED SAPHANEOUS VEIN; ULTRASOUND GUIDED;  Surgeon: Mal [name], MD;  Location: [place];  Service: Vascular;  Laterality: Left;  . INSERTION OF DIALYSIS CATHETER Right 04/30/2018   Procedure: INSERTION OF Right Internal Jugular DIALYSIS CATHETER;  Surgeon: [name], MD;  Location: [place];  Service: Vascular;  Laterality:  Right;  [name] LOWER EXTREMITY ANGIOGRAM Left 07/26/2013   Procedure: LOWER EXTREMITY ANGIOGRAM;  Surgeon: [name] Bisbee, MD;  Location: [facility] CATH LAB;  Service: Cardiovascular;  Laterality: Left;    Family History  Problem Relation Age of Onset  . Cancer Mother 43       breast and bone  . Cancer Father 31       prostate  . Hypertension Sister   . Bleeding Disorder Sister   . Cancer Cousin  20       breast cancer   . Hypertension Daughter   . Breast cancer Neg Hx     Social History:  reports that she has been smoking cigarettes. She has been smoking about 0.00 packs per day for the past 40.00 years. She has quit using smokeless tobacco. She reports that she does not drink alcohol or use drugs.  Allergies:  Allergies  Allergen Reactions  . Carnosine     Other reaction(s): Unknown  . Gadolinium Derivatives Hives and Other (See Comments)    HIVES, Desc: HIVES W/ "DYE" USED FOR 1ST CT SCAN BUT NOT 2ND, NO PREMEDS USED, PT UNCERTAIN OF CIRCUMSTANCES,,?POSSIBLE MRI CONTRAST ALLERGY, ALL STUDIES DONE "SOMEWHERE" IN [place]//A.C., Onset Date: [id number]  . Iohexol Other (See Comments)     Code: HIVES, Desc: HIVES W/ "DYE" USED FOR 1ST CT SCAN BUT NOT 2ND, NO PREMEDS USED, PT UNCERTAIN OF CIRCUMSTANCES,,?POSSIBLE MRI CONTRAST ALLERGY, ALL STUDIES DONE "SOMEWHERE" IN [place]//A.C., Onset Date: [id number]   . Iodine Hives  . Naltrexone     Other reaction(s): Unknown    Medications:  Scheduled: . Chlorhexidine Gluconate Cloth  6 each Topical Q0600    BMP Latest Ref Rng & Units 05/09/2020 11/18/2019 04/21/2019  Glucose 70 - 99 mg/dL 117(H) 135(H) -  BUN 8 - 23 mg/dL 83(H) 40(H) 62(A)  Creatinine 0.44 - 1.00 mg/dL 13.20(H) 8.25(HH) 10.7(A)  Sodium 135 - 145 mmol/L 130(L) 138 -  Potassium 3.5 - 5.1 mmol/L >7.5(HH) 5.4(H) 6.0(A)  Chloride 98 - 111 mmol/L 95(L) 94(L) -  CO2 22 - 32 mmol/L 19(L) 30 -  Calcium 8.9 - 10.3 mg/dL 9.6 9.5 -   CBC Latest Ref Rng & Units 05/09/2020 11/18/2019 04/22/2019  WBC 4.0 - 10.5 K/uL 13.0(H) 12.8(H) 10.8  Hemoglobin 12.0 - 15.0 g/dL 11.3(L) 11.3(L) 33.3(A)  Hematocrit 36.0 - 46.0 % 35.9(L) 35.5(L) 37  Platelets 150 - 400 K/uL 250 240 -     DG Lumbar Spine Complete  Result Date: 05/08/2020 CLINICAL DATA:  Fall. Pain. EXAM: LUMBAR SPINE - COMPLETE 4+ VIEW COMPARISON:  None. FINDINGS: Five non rib-bearing lumbar type vertebral bodies are  present. Vertebral body heights and alignment are normal. Degenerative changes are noted in the lower lumbar facets. No acute or healing fractures are present. Atherosclerotic calcifications are present in the aorta and branch vessels. Dialysis catheter is in place. Surgical clips are present at the gallbladder fossa. The bowel gas pattern is normal. IMPRESSION: 1. Degenerative changes in the lower lumbar facets. 2. No acute or healing fractures. 3. Atherosclerosis. Electronically Signed   By: San Morelle M.D.   On: 05/08/2020 04:56   DG Ankle Complete Left  Result Date: 05/07/2020 CLINICAL DATA:  Trip and fall. Left ankle pain.  Initial encounter. EXAM: LEFT ANKLE COMPLETE - 3+ VIEW COMPARISON:  None. FINDINGS: There is no evidence of fracture, dislocation, or joint effusion. There is no evidence of arthropathy. Small plantar and dorsal calcaneal bone spurs incidentally noted. Soft tissues are unremarkable. IMPRESSION: No  acute findings. Electronically Signed   By: Marlaine Hind M.D.   On: 05/07/2020 13:47   DG Chest Portable 1 View  Result Date: 05/09/2020 CLINICAL DATA:  Shortness of breath EXAM: PORTABLE CHEST 1 VIEW COMPARISON:  March 03, 2019 FINDINGS: Again noted is cardiomegaly. Small bilateral pleural effusions are present. No large airspace consolidation is seen. A right-sided central venous catheter is seen with the tip at the superior cavoatrial junction. No acute osseous abnormality. IMPRESSION: Cardiomegaly and small bilateral pleural effusions. Electronically Signed   By: Prudencio Pair M.D.   On: 05/09/2020 00:44   DG Knee Complete 4 Views Left  Result Date: 05/07/2020 CLINICAL DATA:  Trip and fall. Left knee pain.  Initial encounter. EXAM: LEFT KNEE - COMPLETE 4+ VIEW COMPARISON:  None. FINDINGS: No evidence of fracture, dislocation, or joint effusion. No minimal degenerative spurring of medial and lateral compartments is seen without significant joint space narrowing. No other  osseous abnormality identified. Peripheral arterial vascular calcification and surgical clips noted. IMPRESSION: No acute findings. Minimal degenerative spurring. Electronically Signed   By: Marlaine Hind M.D.   On: 05/07/2020 13:46   DG Knee Complete 4 Views Right  Result Date: 05/07/2020 CLINICAL DATA:  Trip and fall today. Right knee injury and pain. Initial encounter. EXAM: RIGHT KNEE - COMPLETE 4+ VIEW COMPARISON:  08/23/2008 [facility] FINDINGS: No evidence of fracture, dislocation, or joint effusion. Mild tricompartment degenerative spurring appear similar and there is no significant joint space narrowing. No other bone lesions identified. Peripheral arterial vascular calcification is new since previous study in 2009. IMPRESSION: 1. No acute findings. 2. Mild tricompartmental osteoarthritis. Electronically Signed   By: Marlaine Hind M.D.   On: 05/07/2020 13:49   DG Hip Unilat W or Wo Pelvis 2-3 Views Right  Result Date: 05/08/2020 CLINICAL DATA:  Fall. Right hip pain. EXAM: DG HIP (WITH OR WITHOUT PELVIS) 2-3V RIGHT COMPARISON:  None. FINDINGS: Pelvis is unremarkable. Right hip is located. No acute or healing fracture is present. Atherosclerotic changes are noted. IMPRESSION: 1. No acute or healing fracture. 2. Atherosclerosis. Electronically Signed   By: San Morelle M.D.   On: 05/08/2020 04:54    Review of Systems  Constitutional: Negative for activity change, chills, fatigue and fever.  HENT: Negative for ear pain, nosebleeds, sinus pressure and sore throat.   Eyes: Negative for pain, redness and visual disturbance.  Respiratory: Positive for shortness of breath. Negative for cough, chest tightness and wheezing.   Cardiovascular: Negative for chest pain, palpitations and leg swelling.  Gastrointestinal: Negative for abdominal pain, blood in stool, constipation, diarrhea, nausea and vomiting.  Endocrine: Negative.   Genitourinary: Negative.   Musculoskeletal: Positive for  arthralgias and myalgias.       Following injury; left ankle pain, right knee pain and bilateral thigh pain/discomfort  Skin: Negative for pallor and rash.  Neurological: Positive for weakness. Negative for dizziness and light-headedness.   Blood pressure (!) 169/76, pulse 68, temperature 97.9 F (36.6 C), temperature source Oral, resp. rate 15, SpO2 97 %. Physical Exam  Nursing note and vitals reviewed. Constitutional: She is oriented to person, place, and time. She appears well-developed and well-nourished. No distress.  HENT:  Head: Normocephalic and atraumatic.  Mouth/Throat: Oropharynx is clear and moist.  Edentulous  Eyes: Pupils are equal, round, and reactive to light. Conjunctivae are normal. No scleral icterus.  Neck: No JVD present.  Cardiovascular: Normal rate, regular rhythm and normal heart sounds.  No murmur heard. Respiratory: Effort normal and  breath sounds normal. She has no wheezes. She has no rales.  Right IJ TDC  GI: Soft. Bowel sounds are normal. There is no abdominal tenderness. There is no rebound and no guarding.  Musculoskeletal:        General: No edema.     Cervical back: Normal range of motion and neck supple.  Neurological: She is alert and oriented to person, place, and time.  Skin: Skin is warm and dry. No rash noted.  Psychiatric: She has a normal mood and affect.    Assessment/Plan: 1.  Hyperkalemia: She has a problem with hyperkalemia at baseline that has been under fair control with dialysis and today's labs likely reflective of missed dialysis earlier versus some element of tissue trauma from recent fall.  I discussed earlier with Dr. Christy Gentles and initial medical measures initiated for lowering potassium/stabilization of myocardium with calcium, glucose/insulin and Kayexalate.  I have ordered emergent dialysis. 2.  End-stage renal disease on hemodialysis: Emergent hemodialysis for hyperkalemia with efforts at ultrafiltration to dry weight. 3.   Shortness of breath: Likely from pulmonary vascular congestion/mild pulmonary edema-monitor with ultrafiltration and hemodialysis. 3.  Hypertension: Treat with ultrafiltration/hemodialysis and resume oral antihypertensive therapy. 4.  Secondary hyperparathyroidism: Resume calcitriol with dialysis and phosphorus binders. 5.  Anemia of chronic kidney disease: Hemoglobin and hematocrit currently at goal without overt loss and indication for ESA.  Delfino Friesen K. 05/09/2020, 1:52 AM

## 2020-05-09 NOTE — Chronic Care Management (AMB) (Signed)
  Care Management    Consult Note  05/09/2020 Name: Whitney Walker MRN: [id number] DOB: Aug 22, 1956  Care management team received notification of patient's current emergency department visit related to Acute Respiratory Failure and Hyperkalemia .Based on review of health record, [name] is currently active in the embedded care coordination program.   Review of patient status, including review of consultants reports, relevant laboratory and other test results, and collaboration with appropriate care team members and the patient's provider was performed as part of comprehensive patient evaluation and provision of chronic care management services.    Current ED provider treatment plan: Patient was seen soon after arrival to the room for respiratory distress.  She was already on BiPAP by the time I was evaluating her.  Patient was tachypneic and had crackles bilaterally but mental status was appropriate Patient was improving on BiPAP.  Due to hypertensive emergency, IV nitroglycerin was ordered as well as Lasix as patient still reports producing urine Patient had generalized weakness, but was able to move all extremities.  Patient was then found to have severe hyperkalemia over 7.5 which likely contributed to her weakness.  I discussed this with Dr. Posey Pronto with nephrology.  He recommends calcium, insulin and dextrose, and Kayexalate. Patient has been stabilized in the emergency department.  I discussed the case with Dr. Hal Hope who will admit the patient for the hospitalist Ripley Fraise, MD 05/09/20 0150  Patient resting comfortably, no acute distress and appears improved.  No dysrhythmias.  I have confirmed with Dr. Posey Pronto with nephrology that she will be next up for dialysis.  Ripley Fraise, MD 05/09/20 [phone]   Plan: Collaboration with RN Care Manager regarding patient current disposition. SW to follow and outreach patient upon discharge to assist with care coordination  needs.  [name], BSW, CDP Social Worker, Certified Dementia Practitioner [place] [facility] [place] [facility] [phone]

## 2020-05-09 NOTE — Procedures (Signed)
Patient seen and examined on Hemodialysis. BP 140/85   Pulse 94   Temp 98.5 F (36.9 C) (Oral)   Resp 11   SpO2 100%   QB 400 mL /min via R IJ TDC, UF goal 2L  Nitro gtt off, pressures better.  Tolerating treatment without complaints at this time.  [facility] pgr [phone] 10:04 AM

## 2020-05-09 NOTE — Progress Notes (Signed)
Progress Note    Whitney Walker  RDE:[id number] DOB: [birth year]  DOA: 05/08/2020 PCP: [name], FNP    Brief Narrative:   Medical records reviewed and are as summarized below:  Whitney Walker is an 64 y.o. female with a past medical history that includes end-stage renal disease a Monday Wednesday Friday dialysis schedule, hypertension, COPD not on home oxygen, DVT/PE on anticoagulation admitted May 11 with acute respiratory failure with hypoxia and hyperkalemia likely related to missed dialysis treatments.  Patient reports she skipped 1 dialysis treatment due to pain in her left leg after a fall the day prior to her scheduled treatment.  Assessment/Plan:   Principal Problem:   Acute respiratory failure with hypoxia (HCC) Active Problems:   HTN (hypertension)   ESRD on dialysis ([place])   Chronic diastolic heart failure (HCC)   Hyperkalemia   Left leg pain   #1.  Acute respiratory failure with hypoxia.  Patient was on BiPAP when she arrived to the emergency department.  Chest x-ray reveals cardiomegaly and small bilateral pleural effusions.  She received calcium/insulin/Lasix/nitroglycerin for hyperkalemia and hypertensive emergency.  She was evaluated by nephrology who recommended urgent dialysis.  Oxygen saturation level greater than 90% on nonrebreather at the time of my evaluation. -Dialysis per nephrology -Wean oxygen as able -Monitor oxygen saturation level -Supplemental oxygen as indicated -As needed nebulizers  #2.  Hypertension.  [name] blood pressure was elevated at presentation.  Likely related to #1 in the setting of missing dialysis.  Home medications include Coreg.  She was also provided with 40 mg of IV Lasix as well as nitroglycerin drip.  In the emergency department.  Pressure much improved after dialysis. -Continue Coreg -Nitroglycerin drip discontinued per nephrology -Monitor blood pressure -As needed hydralazine  #3.  Hyperkalemia.   Potassium level 7.5 at presentation.  She received calcium IV insulin Kayexalate.  EKG with sinus rhythm first-degree AV block. -Check potassium level after dialysis -Recheck in the morning -Monitor telemetry  #4.  End-stage renal disease.  She has been on dialysis for 2 years.  Monday Wednesday Friday schedule.  Dialyzed today per nephrology. -Further dialysis per nephrology  #5.  Anemia likely of chronic disease.  Hemoglobin stable at 11.  No sign symptoms of active bleeding -Monitor -Management per nephrology  #6.  Left leg/ankle pain.  Patient had a fall 2 days ago.  Imaging is negative for any fracture or dislocation.  Denies pain with weightbearing just reports being "sore all over" -Physical therapy evaluation  #7.  History of DVT. -Continue home apixaban  #8.  Chronic diastolic heart failure. -See #1 -Intake and output -Daily weights    Family Communication/Anticipated D/C date and plan/Code Status   DVT prophylaxis:  Apixaban Code Status: Full Code.  Family Communication: patient at bedside Disposition Plan: Status is: Inpatient  Remains inpatient appropriate because:Inpatient level of care appropriate due to severity of illness   Dispo: The patient is from: Home              Anticipated d/c is to: Home              Anticipated d/c date is: 2 days              Patient currently is not medically stable to d/c.          Medical Consultants:    nephrology   Anti-Infectives:    None  Subjective:   In bed on dialysis unit watching TV.  Denies  pain or discomfort.  Does report feeling "sore all over".  Denies nausea vomiting  Objective:    Vitals:   05/09/20 1000 05/09/20 1013 05/09/20 1017 05/09/20 1100  BP: (!) 169/94 132/75 (!) 158/83 (!) 152/77  Pulse: 96 100 97 (!) 103  Resp:   12 (!) 22  Temp:   97.9 F (36.6 C)   TempSrc:   Oral   SpO2:   100% 96%  Weight:   94.2 kg     Intake/Output Summary (Last 24 hours) at 05/09/2020 1257 Last  data filed at 05/09/2020 1017 Gross per 24 hour  Intake --  Output 1792 ml  Net -1792 ml   Filed Weights   05/09/20 1017  Weight: 94.2 kg    Exam: General: Obese no acute distress very poor dentition CV: Regular rate and rhythm trace lower extremity edema no murmur gallop or rub Respiratory: No increased work of breathing respirations are slightly shallow.  Breath sounds are somewhat distant but I hear no wheezes or crackles Abdomen: Obese soft positive bowel sounds throughout nontender to palpation no guarding or rebounding Musculoskeletal: Joints without swelling/erythema she is got an ankle brace on that left ankle toes are warm to touch pedal pulses present palpable full range of motion Neuro: Alert and oriented x3 speech clear facial symmetry  Data Reviewed:   I have personally reviewed following labs and imaging studies:  Labs: Labs show the following:   Basic Metabolic Panel: Recent Labs  Lab 05/09/20 0005 05/09/20 0708  NA 130* 133*  K >7.5* >7.5*  CL 95* 98  CO2 19* 20*  GLUCOSE 117* 101*  BUN 83* 85*  CREATININE 13.20* 13.59*  CALCIUM 9.6 9.3  PHOS  --  8.9*   GFR Estimated Creatinine Clearance: 4.4 mL/min (A) (by C-G formula based on SCr of 13.59 mg/dL (H)). Liver Function Tests: Recent Labs  Lab 05/09/20 0708  ALBUMIN 3.0*   No results for input(s): LIPASE, AMYLASE in the last 168 hours. No results for input(s): AMMONIA in the last 168 hours. Coagulation profile No results for input(s): INR, PROTIME in the last 168 hours.  CBC: Recent Labs  Lab 05/09/20 0005  WBC 13.0*  NEUTROABS 10.3*  HGB 11.3*  HCT 35.9*  MCV 94.2  PLT 250   Cardiac Enzymes: No results for input(s): CKTOTAL, CKMB, CKMBINDEX, TROPONINI in the last 168 hours. BNP (last 3 results) No results for input(s): PROBNP in the last 8760 hours. CBG: Recent Labs  Lab 05/09/20 0110 05/09/20 0232  GLUCAP 102* 94   D-Dimer: No results for input(s): DDIMER in the last 72  hours. Hgb A1c: No results for input(s): HGBA1C in the last 72 hours. Lipid Profile: No results for input(s): CHOL, HDL, LDLCALC, TRIG, CHOLHDL, LDLDIRECT in the last 72 hours. Thyroid function studies: No results for input(s): TSH, T4TOTAL, T3FREE, THYROIDAB in the last 72 hours.  Invalid input(s): FREET3 Anemia work up: No results for input(s): VITAMINB12, FOLATE, FERRITIN, TIBC, IRON, RETICCTPCT in the last 72 hours. Sepsis Labs: Recent Labs  Lab 05/09/20 0005  WBC 13.0*    Microbiology Recent Results (from the past 240 hour(s))  SARS Coronavirus 2 by RT PCR (hospital order, performed in [facility] hospital lab) Nasopharyngeal Nasopharyngeal Swab     Status: None   Collection Time: 05/09/20 12:27 AM   Specimen: Nasopharyngeal Swab  Result Value Ref Range Status   SARS Coronavirus 2 NEGATIVE NEGATIVE Final    Comment: (NOTE) SARS-CoV-2 target nucleic acids are NOT DETECTED. The SARS-CoV-2  RNA is generally detectable in upper and lower respiratory specimens during the acute phase of infection. The lowest concentration of SARS-CoV-2 viral copies this assay can detect is 250 copies / mL. A negative result does not preclude SARS-CoV-2 infection and should not be used as the sole basis for treatment or other patient management decisions.  A negative result may occur with improper specimen collection / handling, submission of specimen other than nasopharyngeal swab, presence of viral mutation(s) within the areas targeted by this assay, and inadequate number of viral copies (<250 copies / mL). A negative result must be combined with clinical observations, patient history, and epidemiological information. Fact Sheet for Patients:   StrictlyIdeas.no Fact Sheet for Healthcare Providers: BankingDealers.co.za This test is not yet approved or cleared  by the [place] FDA and has been authorized for detection and/or diagnosis of  SARS-CoV-2 by FDA under an Emergency Use Authorization (EUA).  This EUA will remain in effect (meaning this test can be used) for the duration of the COVID-19 declaration under Section 564(b)(1) of the Act, 21 U.S.C. section 360bbb-3(b)(1), unless the authorization is terminated or revoked sooner. Performed at [facility], [place] [address] [place], [place] [address]     Procedures and diagnostic studies:  DG Lumbar Spine Complete  Result Date: 05/08/2020 CLINICAL DATA:  Fall. Pain. EXAM: LUMBAR SPINE - COMPLETE 4+ VIEW COMPARISON:  None. FINDINGS: Five non rib-bearing lumbar type vertebral bodies are present. Vertebral body heights and alignment are normal. Degenerative changes are noted in the lower lumbar facets. No acute or healing fractures are present. Atherosclerotic calcifications are present in the aorta and branch vessels. Dialysis catheter is in place. Surgical clips are present at the gallbladder fossa. The bowel gas pattern is normal. IMPRESSION: 1. Degenerative changes in the lower lumbar facets. 2. No acute or healing fractures. 3. Atherosclerosis. Electronically Signed   By: San Morelle M.D.   On: 05/08/2020 04:56   DG Ankle Complete Left  Result Date: 05/07/2020 CLINICAL DATA:  Trip and fall. Left ankle pain.  Initial encounter. EXAM: LEFT ANKLE COMPLETE - 3+ VIEW COMPARISON:  None. FINDINGS: There is no evidence of fracture, dislocation, or joint effusion. There is no evidence of arthropathy. Small plantar and dorsal calcaneal bone spurs incidentally noted. Soft tissues are unremarkable. IMPRESSION: No acute findings. Electronically Signed   By: Marlaine Hind M.D.   On: 05/07/2020 13:47   DG Chest Portable 1 View  Result Date: 05/09/2020 CLINICAL DATA:  Shortness of breath EXAM: PORTABLE CHEST 1 VIEW COMPARISON:  March 03, 2019 FINDINGS: Again noted is cardiomegaly. Small bilateral pleural effusions are present. No large airspace consolidation is seen. A  right-sided central venous catheter is seen with the tip at the superior cavoatrial junction. No acute osseous abnormality. IMPRESSION: Cardiomegaly and small bilateral pleural effusions. Electronically Signed   By: Prudencio Pair M.D.   On: 05/09/2020 00:44   DG Knee Complete 4 Views Left  Result Date: 05/07/2020 CLINICAL DATA:  Trip and fall. Left knee pain.  Initial encounter. EXAM: LEFT KNEE - COMPLETE 4+ VIEW COMPARISON:  None. FINDINGS: No evidence of fracture, dislocation, or joint effusion. No minimal degenerative spurring of medial and lateral compartments is seen without significant joint space narrowing. No other osseous abnormality identified. Peripheral arterial vascular calcification and surgical clips noted. IMPRESSION: No acute findings. Minimal degenerative spurring. Electronically Signed   By: Marlaine Hind M.D.   On: 05/07/2020 13:46   DG Knee Complete 4 Views Right  Result  Date: 05/07/2020 CLINICAL DATA:  Trip and fall today. Right knee injury and pain. Initial encounter. EXAM: RIGHT KNEE - COMPLETE 4+ VIEW COMPARISON:  08/23/2008 [facility] FINDINGS: No evidence of fracture, dislocation, or joint effusion. Mild tricompartment degenerative spurring appear similar and there is no significant joint space narrowing. No other bone lesions identified. Peripheral arterial vascular calcification is new since previous study in 2009. IMPRESSION: 1. No acute findings. 2. Mild tricompartmental osteoarthritis. Electronically Signed   By: Marlaine Hind M.D.   On: 05/07/2020 13:49   DG Hip Unilat W or Wo Pelvis 2-3 Views Right  Result Date: 05/08/2020 CLINICAL DATA:  Fall. Right hip pain. EXAM: DG HIP (WITH OR WITHOUT PELVIS) 2-3V RIGHT COMPARISON:  None. FINDINGS: Pelvis is unremarkable. Right hip is located. No acute or healing fracture is present. Atherosclerotic changes are noted. IMPRESSION: 1. No acute or healing fracture. 2. Atherosclerosis. Electronically Signed   By: San Morelle  M.D.   On: 05/08/2020 04:54    Medications:   . apixaban  2.5 mg Oral BID  . calcium citrate  200 mg of elemental calcium Oral TID WC  . [START ON 05/10/2020] calcium citrate  400 mg of elemental calcium Oral Q M,W,F-HD  . carvedilol  12.5 mg Oral 2 times per day on Sun Tue Thu Sat   And  . [START ON 05/10/2020] carvedilol  12.5 mg Oral Once per day on Mon Wed Fri  . Chlorhexidine Gluconate Cloth  6 each Topical Q0600  . [START ON 05/10/2020] feeding supplement (NEPRO CARB STEADY)  237 mL Oral Once per day on Mon Wed Fri  . multivitamin  1 tablet Oral QHS  . sevelamer carbonate  3,200 mg Oral TID WC   Continuous Infusions: . sodium chloride    . sodium chloride    . nitroGLYCERIN 50 mcg/min (05/09/20 0316)     LOS: 0 days   Radene Gunning NP [facility]   How to contact the [facility] Attending or Consulting provider [place] or covering provider during after hours [place], for this patient?  1. Check the care team in [facility] and look for a) attending/consulting [facility] provider listed and b) the [facility] team listed 2. Log into [email or web address] and use [place]'s universal password to access. If you do not have the password, please contact the hospital operator. 3. Locate the [facility] provider you are looking for under [facility] and page to a number that you can be directly reached. 4. If you still have difficulty reaching the provider, please page the [facility] (Director on Call) for the Hospitalists listed on amion for assistance.  05/09/2020, 12:57 PM

## 2020-05-09 NOTE — ED Notes (Signed)
cbg 102

## 2020-05-09 NOTE — ED Notes (Signed)
Help get patient into a recliner patient is on the monitor resting with call bell in reach and family at bedside

## 2020-05-09 NOTE — Progress Notes (Signed)
Patient to have CPAP setup. She said she would rather have her [place].

## 2020-05-09 NOTE — H&P (Signed)
History and Physical    Whitney Walker PJK:[id number] DOB: 30-Jan-1956 DOA: 05/08/2020  PCP: [name], FNP  Patient coming from: Home.  Chief Complaint: Shortness of breath.  HPI: Whitney Walker is a 64 y.o. female with history of ESRD on hemodialysis on Monday Wednesday Friday, hypertension, COPD, DVT/PE on apixaban will come to the ER today ago after patient had a fall at the restaurant sustained injury to her knee and left ankle and back.  X-rays done did not show anything acute patient was placed on a brace for the left ankle discharged home.  Patient states after she got discharged home later in the evening started having nausea vomiting and increasing shortness of breath.  Denies any fever chills or productive cough.  Patient missed her dialysis due to transport issues.  ED Course: In the ER patient was hypoxic requiring oxygen chest x-ray shows mild bilateral pleural effusion and congestion.  Covid test was negative.  Labs revealed potassium more than 7.5 and creatinine 13.2 WBC 13 hemoglobin 9.3 EKG shows peaked T waves.  Nephrology was consulted patient was given Kayexalate IV insulin D50 IV calcium and Lasix 40 mg IV for correcting potassium medically.  Patient still complains of some pain in the left ankle.  X-rays done 2 days ago was negative for anything acute.  No obvious swelling seen in the knees or ankle.  Review of Systems: As per HPI, rest all negative.   Past Medical History:  Diagnosis Date  . Anemia   . Anxiety   . CHF (congestive heart failure) ([place])   . Colon cancer [facility])    treatment surgery  . Complication of anesthesia    after first [facility]- [place] "couldnt walk after", patient denies having a spinal  . COPD (chronic obstructive pulmonary disease) ([place])   . Coronary artery disease   . Depression   . DVT (deep venous thrombosis) ([place])   . ESRD (end stage renal disease) ([place])    Hemo: MWF  . History of blood transfusion 04/2018  . Hypertension    07/07/18- no longer takes BP medications  . Meningitis   . Pain in limb 07/30/2013  . PE (pulmonary embolism)   . Peripheral vascular disease ([place])   . Restless legs   . Shortness of breath    with exertion  . Sleep apnea   . SOB (shortness of breath) 03/03/2019  . Vertigo     Past Surgical History:  Procedure Laterality Date  . ABDOMINAL AORTAGRAM N/A 07/26/2013   Procedure: ABDOMINAL Maxcine Ham;  Surgeon: [name] Windsor, MD;  Location: [facility] CATH LAB;  Service: Cardiovascular;  Laterality: N/A;  . ABDOMINAL HYSTERECTOMY    . AV FISTULA PLACEMENT Left 05/05/2018   Procedure: ARTERIOVENOUS (AV) FISTULA CREATION BRACHIOCEPHALIC;  Surgeon: Waynetta Sandy, MD;  Location: [place];  Service: Vascular;  Laterality: Left;  . AV FISTULA PLACEMENT Left 07/16/2018   Procedure: ARTERIOVENOUS FISTULA CREATION;  Surgeon: Waynetta Sandy, MD;  Location: [place];  Service: Vascular;  Laterality: Left;  . BASCILIC VEIN TRANSPOSITION Left 09/03/2018   Procedure: BASILIC VEIN TRANSPOSITION SECOND STAGE;  Surgeon: Waynetta Sandy, MD;  Location: [place];  Service: Vascular;  Laterality: Left;  . BREAST BIOPSY Left   . CESAREAN SECTION     X 3 [id number]  . CHOLECYSTECTOMY    . CHOLECYSTECTOMY  1980/s  . COLECTOMY  2010  . DIVERTICULOSIS SURGERY-2002  2012  . FEMORAL-POPLITEAL BYPASS GRAFT Left 08/13/2013   Procedure: BYPASS GRAFT FEMORAL-POPLITEAL ARTERY  WITH NON-REVERSED SAPHANEOUS VEIN; ULTRASOUND GUIDED;  Surgeon: Mal [name], MD;  Location: [place];  Service: Vascular;  Laterality: Left;  . INSERTION OF DIALYSIS CATHETER Right 04/30/2018   Procedure: INSERTION OF Right Internal Jugular DIALYSIS CATHETER;  Surgeon: [name], MD;  Location: [place];  Service: Vascular;  Laterality: Right;  . LOWER EXTREMITY ANGIOGRAM Left 07/26/2013   Procedure: LOWER EXTREMITY ANGIOGRAM;  Surgeon: [name] Trenton, MD;  Location: [facility] CATH LAB;  Service: Cardiovascular;  Laterality: Left;     reports that  she has been smoking cigarettes. She has been smoking about 0.00 packs per day for the past 40.00 years. She has quit using smokeless tobacco. She reports that she does not drink alcohol or use drugs.  Allergies  Allergen Reactions  . Carnosine     Other reaction(s): Unknown  . Gadolinium Derivatives Hives and Other (See Comments)    HIVES, Desc: HIVES W/ "DYE" USED FOR 1ST CT SCAN BUT NOT 2ND, NO PREMEDS USED, PT UNCERTAIN OF CIRCUMSTANCES,,?POSSIBLE MRI CONTRAST ALLERGY, ALL STUDIES DONE "SOMEWHERE" IN [place]//A.C., Onset Date: [id number]  . Iohexol Other (See Comments)     Code: HIVES, Desc: HIVES W/ "DYE" USED FOR 1ST CT SCAN BUT NOT 2ND, NO PREMEDS USED, PT UNCERTAIN OF CIRCUMSTANCES,,?POSSIBLE MRI CONTRAST ALLERGY, ALL STUDIES DONE "SOMEWHERE" IN [place]//A.C., Onset Date: [id number]   . Iodine Hives  . Naltrexone     Other reaction(s): Unknown    Family History  Problem Relation Age of Onset  . Cancer Mother 78       breast and bone  . Cancer Father 25       prostate  . Hypertension Sister   . Bleeding Disorder Sister   . Cancer Cousin 20       breast cancer   . Hypertension Daughter   . Breast cancer Neg Hx     Prior to Admission medications   Medication Sig Start Date End Date Taking? Authorizing Provider  acetaminophen (TYLENOL) 500 MG tablet Take 1,000 mg by mouth 2 (two) times daily as needed for moderate pain or headache.    [provider]  albuterol (PROVENTIL HFA;VENTOLIN HFA) 108 (90 Base) MCG/ACT inhaler Inhale 2 puffs into the lungs every 6 (six) hours as needed for wheezing or shortness of breath.     [provider]  apixaban (ELIQUIS) 2.5 MG TABS tablet Take 1 tablet (2.5 mg total) by mouth 2 (two) times daily. 05/08/20   Truitt Merle, MD  AURYXIA 1 GM 210 MG(Fe) tablet  11/01/19   [provider]  Biotin 10000 MCG TABS Take by mouth.    [provider]  calcium citrate (CALCITRATE - DOSED IN MG ELEMENTAL CALCIUM) 950 MG  tablet Take 200 mg of elemental calcium by mouth 3 (three) times daily. 2 tabs with dialysis on M,W,F    Edrick Oh, MD  carvedilol (COREG) 12.5 MG tablet Take 12.5 mg by mouth 2 (two) times daily with a meal. Only takes once a day on dialysis days (holds in morning on Monday Wednesday and Friday)     [provider]  docusate sodium (COLACE) 100 MG capsule Take 1 capsule (100 mg total) by mouth every 12 (twelve) hours. Patient not taking: Reported on 05/08/2020 05/08/20   Ward, Delice Bison, DO  HYDROcodone-acetaminophen (NORCO/VICODIN) 5-325 MG tablet Take 1 tablet by mouth every 4 (four) hours as needed. Patient not taking: Reported on 05/08/2020 05/07/20   Recardo Evangelist, PA-C  meclizine (ANTIVERT) 12.5 MG  tablet Take 12.5 mg by mouth 3 (three) times daily as needed for dizziness.    [provider]  mometasone (ELOCON) 0.1 % cream Apply to affected area daily 07/20/19 07/19/20  [name], FNP  multivitamin (RENA-VIT) TABS tablet Take 1 tablet by mouth at bedtime.    Edrick Oh, MD  mupirocin ointment (BACTROBAN) 2 % Apply 1 application topically daily as needed (skin bumps). 04/15/19   [name], FNP  Nutritional Supplements (FEEDING SUPPLEMENT, NEPRO CARB STEADY,) LIQD Take 237 mLs by mouth 3 (three) times a week. During dialysis, Monday, Wednesday & Friday    Edrick Oh, MD  nystatin (NYSTATIN) powder Apply topically 4 (four) times daily. 10/26/19   [name], FNP  ondansetron (ZOFRAN ODT) 4 MG disintegrating tablet Take 1 tablet (4 mg total) by mouth every 6 (six) hours as needed. 05/08/20   Ward, Delice Bison, DO  oxyCODONE-acetaminophen (PERCOCET/ROXICET) 5-325 MG tablet Take 2 tablets by mouth every 4 (four) hours as needed for severe pain. 05/08/20   Ward, Delice Bison, DO  promethazine (PHENERGAN) 12.5 MG tablet Take 12.5 mg by mouth every 6 (six) hours as needed for nausea or vomiting.    [provider]  sevelamer carbonate (RENVELA) 800 MG tablet Take 4  tablets with each meal 10/20/19   [provider]  temazepam (RESTORIL) 15 MG capsule Take 1 capsule (15 mg total) by mouth at bedtime as needed for sleep. 05/04/20   [name], FNP    Physical Exam: Constitutional: Moderately built and nourished. Vitals:   05/09/20 0135 05/09/20 0146 05/09/20 0200 05/09/20 0230  BP: (!) 169/76  (!) 141/80 (!) 164/88  Pulse: 68  68 77  Resp: 15  19 13   Temp:      TempSrc:      SpO2: 100% 97% 99% 99%   Eyes: Anicteric no pallor. ENMT: No discharge from the ears eyes nose or mouth. Neck: No mass felt.  No neck rigidity. Respiratory: No rhonchi or crepitations. Cardiovascular: S1-S2 heard. Abdomen: Soft nontender bowel sounds present. Musculoskeletal: Mild edema of the lower extremities. Skin: No rash. Neurologic: Alert awake oriented to time place and person.  Moves all extremities. Psychiatric: Appears normal per normal affect.   Labs on Admission: I have personally reviewed following labs and imaging studies  CBC: Recent Labs  Lab 05/09/20 0005  WBC 13.0*  NEUTROABS 10.3*  HGB 11.3*  HCT 35.9*  MCV 94.2  PLT 962   Basic Metabolic Panel: Recent Labs  Lab 05/09/20 0005  NA 130*  K >7.5*  CL 95*  CO2 19*  GLUCOSE 117*  BUN 83*  CREATININE 13.20*  CALCIUM 9.6   GFR: Estimated Creatinine Clearance: 4.4 mL/min (A) (by C-G formula based on SCr of 13.2 mg/dL (H)). Liver Function Tests: No results for input(s): AST, ALT, ALKPHOS, BILITOT, PROT, ALBUMIN in the last 168 hours. No results for input(s): LIPASE, AMYLASE in the last 168 hours. No results for input(s): AMMONIA in the last 168 hours. Coagulation Profile: No results for input(s): INR, PROTIME in the last 168 hours. Cardiac Enzymes: No results for input(s): CKTOTAL, CKMB, CKMBINDEX, TROPONINI in the last 168 hours. BNP (last 3 results) No results for input(s): PROBNP in the last 8760 hours. HbA1C: No results for input(s): HGBA1C in the last 72  hours. CBG: Recent Labs  Lab 05/09/20 0110 05/09/20 0232  GLUCAP 102* 94   Lipid Profile: No results for input(s): CHOL, HDL, LDLCALC, TRIG, CHOLHDL, LDLDIRECT in the last 72 hours. Thyroid Function  Tests: No results for input(s): TSH, T4TOTAL, FREET4, T3FREE, THYROIDAB in the last 72 hours. Anemia Panel: No results for input(s): VITAMINB12, FOLATE, FERRITIN, TIBC, IRON, RETICCTPCT in the last 72 hours. Urine analysis:    Component Value Date/Time   COLORURINE YELLOW 03/02/2019 1416   APPEARANCEUR CLEAR 03/02/2019 1416   LABSPEC 1.010 03/02/2019 1416   PHURINE 6.0 03/02/2019 1416   GLUCOSEU 50 (A) 03/02/2019 1416   HGBUR SMALL (A) 03/02/2019 1416   BILIRUBINUR NEGATIVE 03/02/2019 1416   KETONESUR 5 (A) 03/02/2019 1416   PROTEINUR 100 (A) 03/02/2019 1416   UROBILINOGEN 0.2 10/19/2015 1532   NITRITE NEGATIVE 03/02/2019 1416   LEUKOCYTESUR NEGATIVE 03/02/2019 1416   Sepsis Labs: @LABRCNTIP (procalcitonin:4,lacticidven:4) )No results found for this or any previous visit (from the past 240 hour(s)).   Radiological Exams on Admission: DG Lumbar Spine Complete  Result Date: 05/08/2020 CLINICAL DATA:  Fall. Pain. EXAM: LUMBAR SPINE - COMPLETE 4+ VIEW COMPARISON:  None. FINDINGS: Five non rib-bearing lumbar type vertebral bodies are present. Vertebral body heights and alignment are normal. Degenerative changes are noted in the lower lumbar facets. No acute or healing fractures are present. Atherosclerotic calcifications are present in the aorta and branch vessels. Dialysis catheter is in place. Surgical clips are present at the gallbladder fossa. The bowel gas pattern is normal. IMPRESSION: 1. Degenerative changes in the lower lumbar facets. 2. No acute or healing fractures. 3. Atherosclerosis. Electronically Signed   By: San Morelle M.D.   On: 05/08/2020 04:56   DG Ankle Complete Left  Result Date: 05/07/2020 CLINICAL DATA:  Trip and fall. Left ankle pain.  Initial encounter.  EXAM: LEFT ANKLE COMPLETE - 3+ VIEW COMPARISON:  None. FINDINGS: There is no evidence of fracture, dislocation, or joint effusion. There is no evidence of arthropathy. Small plantar and dorsal calcaneal bone spurs incidentally noted. Soft tissues are unremarkable. IMPRESSION: No acute findings. Electronically Signed   By: Marlaine Hind M.D.   On: 05/07/2020 13:47   DG Chest Portable 1 View  Result Date: 05/09/2020 CLINICAL DATA:  Shortness of breath EXAM: PORTABLE CHEST 1 VIEW COMPARISON:  March 03, 2019 FINDINGS: Again noted is cardiomegaly. Small bilateral pleural effusions are present. No large airspace consolidation is seen. A right-sided central venous catheter is seen with the tip at the superior cavoatrial junction. No acute osseous abnormality. IMPRESSION: Cardiomegaly and small bilateral pleural effusions. Electronically Signed   By: Prudencio Pair M.D.   On: 05/09/2020 00:44   DG Knee Complete 4 Views Left  Result Date: 05/07/2020 CLINICAL DATA:  Trip and fall. Left knee pain.  Initial encounter. EXAM: LEFT KNEE - COMPLETE 4+ VIEW COMPARISON:  None. FINDINGS: No evidence of fracture, dislocation, or joint effusion. No minimal degenerative spurring of medial and lateral compartments is seen without significant joint space narrowing. No other osseous abnormality identified. Peripheral arterial vascular calcification and surgical clips noted. IMPRESSION: No acute findings. Minimal degenerative spurring. Electronically Signed   By: Marlaine Hind M.D.   On: 05/07/2020 13:46   DG Knee Complete 4 Views Right  Result Date: 05/07/2020 CLINICAL DATA:  Trip and fall today. Right knee injury and pain. Initial encounter. EXAM: RIGHT KNEE - COMPLETE 4+ VIEW COMPARISON:  08/23/2008 [facility] FINDINGS: No evidence of fracture, dislocation, or joint effusion. Mild tricompartment degenerative spurring appear similar and there is no significant joint space narrowing. No other bone lesions identified. Peripheral  arterial vascular calcification is new since previous study in 2009. IMPRESSION: 1. No acute findings. 2. Mild  tricompartmental osteoarthritis. Electronically Signed   By: Marlaine Hind M.D.   On: 05/07/2020 13:49   DG Hip Unilat W or Wo Pelvis 2-3 Views Right  Result Date: 05/08/2020 CLINICAL DATA:  Fall. Right hip pain. EXAM: DG HIP (WITH OR WITHOUT PELVIS) 2-3V RIGHT COMPARISON:  None. FINDINGS: Pelvis is unremarkable. Right hip is located. No acute or healing fracture is present. Atherosclerotic changes are noted. IMPRESSION: 1. No acute or healing fracture. 2. Atherosclerosis. Electronically Signed   By: San Morelle M.D.   On: 05/08/2020 04:54    EKG: Independently reviewed.  Normal sinus rhythm with peaked T waves.  Assessment/Plan Principal Problem:   Acute respiratory failure with hypoxia (HCC) Active Problems:   HTN (hypertension)   ESRD on dialysis (HCC)   Hyperkalemia    1. Acute respiratory failure with hypoxia with hyperkalemia likely from missed dialysis.  Discussed with Dr. Posey Pronto with nephrologist will be arranging for urgent dialysis.  For the hyperkalemia patient did receive IV insulin D50 IV calcium IV Lasix and Kayexalate. 2. Nausea vomiting suspect likely from uremia.  Abdomen appears benign.  May check LFTs and further imaging if patient's nausea vomiting persist after dialysis. 3. Hypertension takes Coreg on dialysis days. 4. Anemia likely from ESRD follow CBC. 5. Recent fall with left ankle and knee pain x-rays did not show anything acute.  Closely monitor. 6. History of DVT on apixaban.  Since patient has had acute pulmonary edema and hyperkalemia after missing dialysis patient likely will need more than 2 midnight stay in inpatient status.   DVT prophylaxis: Apixaban. Code Status: Full code. Family Communication: Discussed with patient. Disposition Plan: Home. Consults called: Nephrology. Admission status: Inpatient.   Rise Patience MD [facility] Pager [phone].  If 7PM-7AM, please contact night-coverage [email or web address] Password [facility]  05/09/2020, 2:58 AM

## 2020-05-09 NOTE — ED Notes (Signed)
CBG 94 

## 2020-05-09 NOTE — ED Notes (Signed)
Patient placed on pure wick °

## 2020-05-09 NOTE — ED Notes (Signed)
Pt. returned from HD completion, N.A.D. noted. Connected to bedside monitor

## 2020-05-10 ENCOUNTER — Ambulatory Visit: Payer: Self-pay

## 2020-05-10 ENCOUNTER — Telehealth: Payer: Self-pay

## 2020-05-10 DIAGNOSIS — D631 Anemia in chronic kidney disease: Secondary | ICD-10-CM | POA: Diagnosis not present

## 2020-05-10 DIAGNOSIS — I5032 Chronic diastolic (congestive) heart failure: Secondary | ICD-10-CM

## 2020-05-10 DIAGNOSIS — N186 End stage renal disease: Secondary | ICD-10-CM

## 2020-05-10 DIAGNOSIS — E875 Hyperkalemia: Principal | ICD-10-CM

## 2020-05-10 DIAGNOSIS — I10 Essential (primary) hypertension: Secondary | ICD-10-CM

## 2020-05-10 DIAGNOSIS — Z992 Dependence on renal dialysis: Secondary | ICD-10-CM | POA: Diagnosis not present

## 2020-05-10 DIAGNOSIS — D689 Coagulation defect, unspecified: Secondary | ICD-10-CM | POA: Diagnosis not present

## 2020-05-10 DIAGNOSIS — J9601 Acute respiratory failure with hypoxia: Secondary | ICD-10-CM

## 2020-05-10 DIAGNOSIS — Z23 Encounter for immunization: Secondary | ICD-10-CM | POA: Diagnosis not present

## 2020-05-10 DIAGNOSIS — N2581 Secondary hyperparathyroidism of renal origin: Secondary | ICD-10-CM | POA: Diagnosis not present

## 2020-05-10 DIAGNOSIS — D509 Iron deficiency anemia, unspecified: Secondary | ICD-10-CM | POA: Diagnosis not present

## 2020-05-10 DIAGNOSIS — M79605 Pain in left leg: Secondary | ICD-10-CM

## 2020-05-10 LAB — BASIC METABOLIC PANEL
Anion gap: 13 (ref 5–15)
BUN: 35 mg/dL — ABNORMAL HIGH (ref 8–23)
CO2: 25 mmol/L (ref 22–32)
Calcium: 9.1 mg/dL (ref 8.9–10.3)
Chloride: 97 mmol/L — ABNORMAL LOW (ref 98–111)
Creatinine, Ser: 8.44 mg/dL — ABNORMAL HIGH (ref 0.44–1.00)
GFR calc Af Amer: 5 mL/min — ABNORMAL LOW (ref >60)
GFR calc non Af Amer: 5 mL/min — ABNORMAL LOW (ref >60)
Glucose, Bld: 100 mg/dL — ABNORMAL HIGH (ref 70–99)
Potassium: 5.1 mmol/L (ref 3.5–5.1)
Sodium: 135 mmol/L (ref 135–145)

## 2020-05-10 MED ORDER — CHLORHEXIDINE GLUCONATE CLOTH 2 % EX PADS: 6.0000 | MEDICATED_PAD | Freq: Every day | CUTANEOUS | Status: DC

## 2020-05-10 MED ORDER — NEPRO/CARBSTEADY PO LIQD: 237.0000 mL | ORAL | Status: DC

## 2020-05-10 NOTE — Progress Notes (Signed)
Nsg Discharge Note  Admit Date:  05/08/2020 Discharge date: 05/10/2020   Whitney Walker to be D/C'd first to outpatient dialysis via taxi per patient's wishes, then home via transport/taxi as well. per MD order.  AVS completed.  Patient/caregiver able to verbalize understanding.  Discharge Medication: Allergies as of 05/10/2020      Reactions   Carnosine    Other reaction(s): Unknown   Gadolinium Derivatives Hives, Other (See Comments)   HIVES, Desc: HIVES W/ "DYE" USED FOR 1ST CT SCAN BUT NOT 2ND, NO PREMEDS USED, PT UNCERTAIN OF CIRCUMSTANCES,,?POSSIBLE MRI CONTRAST ALLERGY, ALL STUDIES DONE "SOMEWHERE" IN [place]//A.C., Onset Date: [id number]   Iohexol Other (See Comments)    Code: HIVES, Desc: HIVES W/ "DYE" USED FOR 1ST CT SCAN BUT NOT 2ND, NO PREMEDS USED, PT UNCERTAIN OF CIRCUMSTANCES,,?POSSIBLE MRI CONTRAST ALLERGY, ALL STUDIES DONE "SOMEWHERE" IN [place]//A.C., Onset Date: 44010272   Iodine Hives   Naltrexone    Other reaction(s): Unknown      Medication List    STOP taking these medications   docusate sodium 100 MG capsule Commonly known as: COLACE   HYDROcodone-acetaminophen 5-325 MG tablet Commonly known as: NORCO/VICODIN     TAKE these medications   acetaminophen 500 MG tablet Commonly known as: TYLENOL Take 1,000 mg by mouth 2 (two) times daily as needed for moderate pain or headache.   albuterol 108 (90 Base) MCG/ACT inhaler Commonly known as: VENTOLIN HFA Inhale 2 puffs into the lungs every 6 (six) hours as needed for wheezing or shortness of breath.   apixaban 2.5 MG Tabs tablet Commonly known as: Eliquis Take 1 tablet (2.5 mg total) by mouth 2 (two) times daily. Notes to patient: This evening, 05/10/20.   Biotin 10000 MCG Tabs Take 1 tablet by mouth daily. Notes to patient: Tomorrow, 05/11/20.   calcium citrate 950 (200 Ca) MG tablet Commonly known as: CALCITRATE - dosed in mg elemental calcium Take 200 mg of elemental calcium by mouth 3  (three) times daily. 2 tabs with dialysis on M,W,F Notes to patient: This afternoon, resume home schedule, 05/10/20.   carvedilol 12.5 MG tablet Commonly known as: COREG Take 12.5 mg by mouth 2 (two) times daily with a meal. Only takes once a day on dialysis days (holds in morning on Monday Wednesday and Friday) Notes to patient: Resume home schedule.   feeding supplement (NEPRO CARB STEADY) Liqd Take 237 mLs by mouth 3 (three) times a week. During dialysis, Monday, Wednesday & Friday Notes to patient: Resume home schedule.   meclizine 12.5 MG tablet Commonly known as: ANTIVERT Take 12.5 mg by mouth 3 (three) times daily as needed for dizziness.   mometasone 0.1 % cream Commonly known as: Elocon Apply to affected area daily Notes to patient: Tomorrow, 05/11/20.   multivitamin Tabs tablet Take 1 tablet by mouth at bedtime. Notes to patient: Tonight, 05/10/20.   mupirocin ointment 2 % Commonly known as: BACTROBAN Apply 1 application topically daily as needed (skin bumps).   nystatin powder Commonly known as: nystatin Apply topically 4 (four) times daily. Notes to patient: This afternoon, 05/10/20.   ondansetron 4 MG disintegrating tablet Commonly known as: Zofran ODT Take 1 tablet (4 mg total) by mouth every 6 (six) hours as needed. What changed: reasons to take this   oxyCODONE-acetaminophen 5-325 MG tablet Commonly known as: PERCOCET/ROXICET Take 2 tablets by mouth every 4 (four) hours as needed for severe pain.   sevelamer carbonate 800 MG tablet Commonly known as: RENVELA Take 3,200 mg  by mouth 3 (three) times daily with meals. Take 4 tablets with each meal Notes to patient: Today, with next meal, 05/10/20.   temazepam 15 MG capsule Commonly known as: RESTORIL Take 1 capsule (15 mg total) by mouth at bedtime as needed for sleep.       Discharge Assessment: Vitals:   05/10/20 0524 05/10/20 0943  BP: (!) 172/74 (!) 171/86  Pulse: 81 98  Resp: 18 18  Temp: 98 F  (36.7 C) 99.2 F (37.3 C)  SpO2: 94% 96%   Skin clean, dry and intact without evidence of skin break down, no evidence of skin tears noted. IV catheter discontinued intact. Site without signs and symptoms of complications - no redness or edema noted at insertion site, patient denies c/o pain - only slight tenderness at site.  Dressing with slight pressure applied.  D/c Instructions-Education: Discharge instructions given to patient/family with verbalized understanding. D/c education completed with patient/family including follow up instructions, medication list, d/c activities limitations if indicated, with other d/c instructions as indicated by MD - patient able to verbalize understanding, all questions fully answered. Patient instructed to return to ED, call 911, or call MD for any changes in condition.  Patient escorted via WC, and D/C home/outpatient dialysis via transport. Joni Reining, RN 05/10/2020 11:57 AM

## 2020-05-10 NOTE — Care Management Important Message (Signed)
Important Message  Patient Details  Name: Whitney Walker MRN: [id number] Date of Birth: 1956-07-06   Medicare Important Message Given:  Yes Patient left prior to IM delivery.  IM mailed to patient home address.     Anela Bensman 05/10/2020, 2:18 PM

## 2020-05-10 NOTE — Progress Notes (Signed)
[facility] Progress Note   Subjective:   Patient seen and examined at bedside.  Plan to d/c home today to outpatient dialysis.  Denies SOB, CP, n/v/d, weakness, dizziness and fatigue.   Objective Vitals:   05/10/20 0053 05/10/20 0524 05/10/20 0943 05/10/20 1104  BP: 138/70 (!) 172/74 (!) 171/86   Pulse: 90 81 98   Resp: 18 18 18    Temp: 98.7 F (37.1 C) 98 F (36.7 C) 99.2 F (37.3 C)   TempSrc: Oral  Oral   SpO2: 98% 94% 96%   Weight:    89.3 kg  Height:       Physical Exam General:WDWN female in NAD Heart:RRR Lungs:mostly CTAB, faint crackles in LLL Abdomen:soft, NTND Extremities:no LE edema Dialysis Access: [facility]   Filed Weights   05/09/20 1017 05/09/20 1700 05/10/20 1104  Weight: 94.2 kg 86.2 kg 89.3 kg    Intake/Output Summary (Last 24 hours) at 05/10/2020 1109 Last data filed at 05/10/2020 0900 Gross per 24 hour  Intake 240 ml  Output 0 ml  Net 240 ml    Additional Objective Labs: Basic Metabolic Panel: Recent Labs  Lab 05/09/20 0708 05/09/20 1246 05/10/20 0631  NA 133* 136 135  K >7.5* 4.8 5.1  CL 98 96* 97*  CO2 20* 26 25  GLUCOSE 101* 71 100*  BUN 85* 19 35*  CREATININE 13.59* 5.80* 8.44*  CALCIUM 9.3 9.1 9.1  PHOS 8.9*  --   --    Liver Function Tests: Recent Labs  Lab 05/09/20 0708  ALBUMIN 3.0*   CBC: Recent Labs  Lab 05/09/20 0005 05/09/20 1246  WBC 13.0* 12.3*  NEUTROABS 10.3*  --   HGB 11.3* 11.1*  HCT 35.9* 35.2*  MCV 94.2 93.1  PLT 250 221   CBG: Recent Labs  Lab 05/09/20 0110 05/09/20 0232  GLUCAP 102* 94   Studies/Results: DG Chest Portable 1 View  Result Date: 05/09/2020 CLINICAL DATA:  Shortness of breath EXAM: PORTABLE CHEST 1 VIEW COMPARISON:  March 03, 2019 FINDINGS: Again noted is cardiomegaly. Small bilateral pleural effusions are present. No large airspace consolidation is seen. A right-sided central venous catheter is seen with the tip at the superior cavoatrial junction. No acute osseous  abnormality. IMPRESSION: Cardiomegaly and small bilateral pleural effusions. Electronically Signed   By: Prudencio Pair M.D.   On: 05/09/2020 00:44    Medications: . sodium chloride    . sodium chloride     . apixaban  2.5 mg Oral BID  . calcium citrate  200 mg of elemental calcium Oral TID WC  . calcium citrate  400 mg of elemental calcium Oral Q M,W,F-HD  . carvedilol  12.5 mg Oral 2 times per day on Sun Tue Thu Sat   And  . carvedilol  12.5 mg Oral Once per day on Mon Wed Fri  . Chlorhexidine Gluconate Cloth  6 each Topical Q0600  . Chlorhexidine Gluconate Cloth  6 each Topical Q0600  . feeding supplement (NEPRO CARB STEADY)  237 mL Oral Once per day on Mon Wed Fri  . multivitamin  1 tablet Oral QHS  . sevelamer carbonate  3,200 mg Oral TID WC    Dialysis Orders: Monday/Wednesday/Friday, [facility], Optiflux 180, BFR 400, DFR AF 1.5, EDW 89.5 kg, 2K/2.0 calcium, UF profile #2.  Right IJ TDC.  Heparin 4000 unit bolus, Venofer 50 mg IV weekly, calcitriol 2.5 mcg p.o. 3 times weekly HD.  Assessment/Plan: 1. Hyperkalemia - resolved post HD. K5.1 today,  plan for HD at OP today.   2. Hypoxia - in setting of missed HD.  Resolved today.  CXR showed small b/l pleural effusions.  Unsure if weights correct. Net UF removed 1.8L.  May need to have EDW lowered, follow up at OP. 2. ESRD - HD yesterday off schedule d/t missed HD. Plan for HD today per regular schedule at OP center.  Transportation arranged. 3. Anemia of CKD- Hgb 11.1. No indication for ESA at this time.  4. Secondary hyperparathyroidism - Ca in goal. Phos^, reinforced binders, cont VDRA.  5. HTN - BP improved post HD, elevated today.  Continue home meds 6. Nutrition - Renal diet w/fluid restrictions.   Jen Mow, PA-C [facility] Pager: [phone] 05/10/2020,11:09 AM  LOS: 1 day

## 2020-05-10 NOTE — Progress Notes (Signed)
Patient threatening to leave AMA despite many requests for clothing, transportation and discharge planning made of staff and met. MD notified, also notified of run of svt this morning at 0930. AMA forms presented to patient, which she refused to sign.  Verified that patient's ride is not here, patient states, "I will wait til 1114."

## 2020-05-10 NOTE — Progress Notes (Signed)
Central Tele called to alert RN of several beats of SVTat 0930. RN assessed patient at that time. Patient denying chest pain or SOB. Vital signs stable as documented. At this time, patient was upset and demanding to be discharged. MD notifed via secure text.

## 2020-05-10 NOTE — Evaluation (Signed)
Physical Therapy Evaluation Patient Details Name: Lanett Lasorsa MRN: [id number] DOB: [birth year] Today's Date: 05/10/2020   History of Present Illness  Anaalicia Reimann is an 64 y.o. female with a past medical history that includes end-stage renal disease a Monday Wednesday Friday dialysis schedule, hypertension, COPD not on home oxygen, DVT/PE on anticoagulation admitted May 11 with acute respiratory failure with hypoxia and hyperkalemia likely related to missed dialysis treatments.  Patient reports she skipped 1 dialysis treatment due to pain in her left leg after a fall the day prior to her scheduled treatment.  Clinical Impression   Pt admitted with above diagnosis. Comes from home where she lives alone in a one level apartment; Managing independently Prior to admission, uses [facility] transport to/from HD; Presents to PT with decr knowledge and use of DME, painful L ankle and R knee in standing; Good use of RW to unweigh painful LEs in stance; Pt currently with functional limitations due to the deficits listed below (see PT Problem List). Pt will benefit from skilled PT to increase their independence and safety with mobility to allow discharge to the venue listed below.       Follow Up Recommendations Home health PT    Equipment Recommendations  Rolling walker with 5" wheels    Recommendations for Other Services       Precautions / Restrictions Precautions Precautions: None Restrictions Weight Bearing Restrictions: No RLE Weight Bearing: Weight bearing as tolerated LLE Weight Bearing: Weight bearing as tolerated Other Position/Activity Restrictions: With no bony injury from recent fall per imaging, and no specified weight bearing restrictions: will proceed WBAT and give her options for unweighing LEs to manage       Mobility  Bed Mobility                  Transfers Overall transfer level: Needs assistance Equipment used: Rolling walker (2  wheeled) Transfers: Sit to/from Stand Sit to Stand: Supervision         General transfer comment: Cues for hand placement  Ambulation/Gait Ambulation/Gait assistance: Supervision Gait Distance (Feet): 30 Feet Assistive device: Rolling walker (2 wheeled) Gait Pattern/deviations: Step-through pattern     General Gait Details: Good use of RW to unweight painful L ankle and R knee in stance  Stairs            Wheelchair Mobility    Modified Rankin (Stroke Patients Only)       Balance                                             Pertinent Vitals/Pain Pain Assessment: Faces Faces Pain Scale: Hurts little more Pain Location: L ankle, R knee Pain Descriptors / Indicators: Aching Pain Intervention(s): Monitored during session;Other (comment)(Used RW to unweigh painful L anle and R knee in stance)    Home Living Family/patient expects to be discharged to:: Private residence Living Arrangements: Alone Available Help at Discharge: Family(prn assist) Type of Home: Apartment Home Access: Stairs to enter Entrance Stairs-Rails: Psychiatric nurse of Steps: 4 Home Layout: One level   Additional Comments: Unclear historian at times    Prior Function Level of Independence: Independent;Independent with assistive device(s)         Comments: Uses [facility] transport for HD     Hand Dominance        Extremity/Trunk Assessment   Upper  Extremity Assessment Upper Extremity Assessment: Overall WFL for tasks assessed    Lower Extremity Assessment Lower Extremity Assessment: RLE deficits/detail;LLE deficits/detail RLE Deficits / Details: Hip and knee WFL; noted ankle swelling, painful to palpation lateral aspect of ankle joint; Able to perform gentle AROM of the ankle LLE Deficits / Details: ROM hip, knee, ankle WFL; noting some pain with weight bearing in knee - imaging negative for acute bony abnormality, but presence of OA        Communication   Communication: No difficulties  Cognition Arousal/Alertness: Awake/alert Behavior During Therapy: WFL for tasks assessed/performed;Restless Overall Cognitive Status: Within Functional Limits for tasks assessed(for simple mobility)                                 General Comments: Struck me as distracted and impulsive; Educated pt on how to Lawyer (ASO), and she verbalized that she knew how to do it, but then later RN told me she asked for assistance with her ASO      General Comments General comments (skin integrity, edema, etc.): Educated Ms. Manzo in principle of RICE, and how to don/doff ASO L ankle; Elevated ankle on pillows, she requested to leave the brace off, and declined my help in putting it back on    Exercises     Assessment/Plan    PT Assessment Patient needs continued PT services  PT Problem List Decreased strength;Decreased range of motion;Decreased activity tolerance;Decreased knowledge of precautions       PT Treatment Interventions DME instruction;Gait training;Stair training;Functional mobility training;Therapeutic activities;Therapeutic exercise;Balance training;Cognitive remediation;Patient/family education    PT Goals (Current goals can be found in the Care Plan section)  Acute Rehab PT Goals Patient Stated Goal: Hopes to be home soon PT Goal Formulation: With patient Time For Goal Achievement: 05/17/20 Potential to Achieve Goals: Good    Frequency Min 3X/week   Barriers to discharge        Co-evaluation               AM-PAC PT "6 Clicks" Mobility  Outcome Measure Help needed turning from your back to your side while in a flat bed without using bedrails?: None Help needed moving from lying on your back to sitting on the side of a flat bed without using bedrails?: None Help needed moving to and from a bed to a chair (including a wheelchair)?: None Help needed standing up from a chair  using your arms (e.g., wheelchair or bedside chair)?: None Help needed to walk in hospital room?: None Help needed climbing 3-5 steps with a railing? : A Little 6 Click Score: 23    End of Session Equipment Utilized During Treatment: Gait belt Activity Tolerance: Patient tolerated treatment well Patient left: in chair;with call bell/phone within reach Nurse Communication: Mobility status PT Visit Diagnosis: Other abnormalities of gait and mobility (R26.89);Pain Pain - Right/Left: Right Pain - part of body: Ankle and joints of foot(and L knee)    Time: 4174-0814 PT Time Calculation (min) (ACUTE ONLY): 22 min   Charges:   PT Evaluation $PT Eval Moderate Complexity: 1 Mod          Roney Marion, [place]  Acute Rehabilitation Services Pager [phone] Office [place] 05/10/2020, 11:45 AM

## 2020-05-10 NOTE — Discharge Summary (Signed)
Discharge Summary  Whitney Walker PIR:[id number] DOB: April 04, 1956  PCP: [name], FNP  Admit date: 05/08/2020 Discharge date: 05/10/2020  Time spent: 35 mins  Recommendations for Outpatient Follow-up:  1. Follow-up with PCP in 1 week 2. Follow-up with HD    Discharge Diagnoses:  Active Hospital Problems   Diagnosis Date Noted   Acute respiratory failure with hypoxia ([place]) 05/09/2020   Hyperkalemia 05/09/2020   Left leg pain 05/09/2020   ESRD on dialysis ([place]) 04/29/2018   HTN (hypertension) 10/18/2015   Chronic diastolic heart failure ([place]) 11/15/2013    Resolved Hospital Problems  No resolved problems to display.    Discharge Condition: Stable  Diet recommendation: Renal diet, heart healthy  Vitals:   05/10/20 0524 05/10/20 0943  BP: (!) 172/74 (!) 171/86  Pulse: 81 98  Resp: 18 18  Temp: 98 F (36.7 C) 99.2 F (37.3 C)  SpO2: 94% 96%    History of present illness:  Whitney Walker is an 64 y.o. female with a past medical history that includes end-stage renal disease a Monday Wednesday Friday dialysis schedule, hypertension, COPD not on home oxygen, DVT/PE on anticoagulation admitted May 11 with acute respiratory failure with hypoxia and hyperkalemia likely related to missed dialysis treatments.  Patient reports she skipped 1 dialysis treatment due to pain in her left leg after a fall the day prior to her scheduled treatment.    Today, patient denies any new complaints, reports feeling much better, eager to be discharged.  Patient denies any chest pain, worsening shortness of breath, abdominal pain, nausea/vomiting, fever/chills.  Patient already scheduled for outpatient dialysis today on 05/10/2020, advised patient to be compliant.   Hospital Course:  Principal Problem:   Acute respiratory failure with hypoxia (HCC) Active Problems:   Chronic diastolic heart failure (HCC)   HTN (hypertension)   ESRD on dialysis [facility])   Hyperkalemia  Left leg pain   Acute respiratory failure with hypoxia Resolved, currently on room air saturating well Patient was on BiPAP when she arrived to the emergency department Chest x-ray reveals cardiomegaly and small bilateral pleural effusions Nephrology consulted, patient had urgent dialysis on 05/09/2020 Advised to follow-up with outpatient dialysis as scheduled   Hypertension Continue home Coreg  Hyperkalemia Resolved Potassium level 7.5 at presentation Advised to be compliant with dialysis  End-stage renal disease Advised to be compliant with outpatient dialysis  Anemia likely of chronic disease Hemoglobin stable at 11  Left leg/ankle pain status post fall  Imaging is negative for any fracture or dislocation PT evaluated, home health PT/OT recommended  History of DVT Continue home apixaban  Chronic diastolic heart failure Stable  Obesity Lifestyle modification advised  Tobacco abuse Advised to quit          Malnutrition Type:      Malnutrition Characteristics:      Nutrition Interventions:      Estimated body mass index is 37.2 kg/m as calculated from the following:   Height as of this encounter: 5\' 1"  (1.549 m).   Weight as of this encounter: 89.3 kg.    Procedures:  None  Consultations:  Nephrology  Discharge Exam: BP (!) 171/86 (BP Location: Left Wrist)    Pulse 98    Temp 99.2 F (37.3 C) (Oral)    Resp 18    Ht 5\' 1"  (1.549 m)    Wt 89.3 kg Comment: pt wanted weighed - already had home clothes on.   SpO2 96%    BMI 37.20 kg/m  General: NAD Cardiovascular: S1, S2 present Respiratory: CTA B  Discharge Instructions You were cared for by a hospitalist during your hospital stay. If you have any questions about your discharge medications or the care you received while you were in the hospital after you are discharged, you can call the unit and asked to speak with the hospitalist on call if the hospitalist that took care of you is  not available. Once you are discharged, your primary care physician will handle any further medical issues. Please note that NO REFILLS for any discharge medications will be authorized once you are discharged, as it is imperative that you return to your primary care physician (or establish a relationship with a primary care physician if you do not have one) for your aftercare needs so that they can reassess your need for medications and monitor your lab values.  Discharge Instructions    Diet - low sodium heart healthy   Complete by: As directed    Increase activity slowly   Complete by: As directed      Allergies as of 05/10/2020      Reactions   Carnosine    Other reaction(s): Unknown   Gadolinium Derivatives Hives, Other (See Comments)   HIVES, Desc: HIVES W/ "DYE" USED FOR 1ST CT SCAN BUT NOT 2ND, NO PREMEDS USED, PT UNCERTAIN OF CIRCUMSTANCES,,?POSSIBLE MRI CONTRAST ALLERGY, ALL STUDIES DONE "SOMEWHERE" IN [place]//A.C., Onset Date: [id number]   Iohexol Other (See Comments)    Code: HIVES, Desc: HIVES W/ "DYE" USED FOR 1ST CT SCAN BUT NOT 2ND, NO PREMEDS USED, PT UNCERTAIN OF CIRCUMSTANCES,,?POSSIBLE MRI CONTRAST ALLERGY, ALL STUDIES DONE "SOMEWHERE" IN [place]//A.C., Onset Date: [id number]   Iodine Hives   Naltrexone    Other reaction(s): Unknown      Medication List    STOP taking these medications   docusate sodium 100 MG capsule Commonly known as: COLACE   HYDROcodone-acetaminophen 5-325 MG tablet Commonly known as: NORCO/VICODIN     TAKE these medications   acetaminophen 500 MG tablet Commonly known as: TYLENOL Take 1,000 mg by mouth 2 (two) times daily as needed for moderate pain or headache.   albuterol 108 (90 Base) MCG/ACT inhaler Commonly known as: VENTOLIN HFA Inhale 2 puffs into the lungs every 6 (six) hours as needed for wheezing or shortness of breath.   apixaban 2.5 MG Tabs tablet Commonly known as: Eliquis Take 1 tablet (2.5 mg total) by mouth 2  (two) times daily. Notes to patient: This evening, 05/10/20.   Biotin 10000 MCG Tabs Take 1 tablet by mouth daily. Notes to patient: Tomorrow, 05/11/20.   calcium citrate 950 (200 Ca) MG tablet Commonly known as: CALCITRATE - dosed in mg elemental calcium Take 200 mg of elemental calcium by mouth 3 (three) times daily. 2 tabs with dialysis on M,W,F Notes to patient: This afternoon, resume home schedule, 05/10/20.   carvedilol 12.5 MG tablet Commonly known as: COREG Take 12.5 mg by mouth 2 (two) times daily with a meal. Only takes once a day on dialysis days (holds in morning on Monday Wednesday and Friday) Notes to patient: Resume home schedule.   feeding supplement (NEPRO CARB STEADY) Liqd Take 237 mLs by mouth 3 (three) times a week. During dialysis, Monday, Wednesday & Friday Notes to patient: Resume home schedule.   meclizine 12.5 MG tablet Commonly known as: ANTIVERT Take 12.5 mg by mouth 3 (three) times daily as needed for dizziness.   mometasone 0.1 % cream Commonly known as: Elocon Apply to  affected area daily Notes to patient: Tomorrow, 05/11/20.   multivitamin Tabs tablet Take 1 tablet by mouth at bedtime. Notes to patient: Tonight, 05/10/20.   mupirocin ointment 2 % Commonly known as: BACTROBAN Apply 1 application topically daily as needed (skin bumps).   nystatin powder Commonly known as: nystatin Apply topically 4 (four) times daily. Notes to patient: This afternoon, 05/10/20.   ondansetron 4 MG disintegrating tablet Commonly known as: Zofran ODT Take 1 tablet (4 mg total) by mouth every 6 (six) hours as needed. What changed: reasons to take this   oxyCODONE-acetaminophen 5-325 MG tablet Commonly known as: PERCOCET/ROXICET Take 2 tablets by mouth every 4 (four) hours as needed for severe pain.   sevelamer carbonate 800 MG tablet Commonly known as: RENVELA Take 3,200 mg by mouth 3 (three) times daily with meals. Take 4 tablets with each meal Notes to  patient: Today, with next meal, 05/10/20.   temazepam 15 MG capsule Commonly known as: RESTORIL Take 1 capsule (15 mg total) by mouth at bedtime as needed for sleep.      Allergies  Allergen Reactions   Carnosine     Other reaction(s): Unknown   Gadolinium Derivatives Hives and Other (See Comments)    HIVES, Desc: HIVES W/ "DYE" USED FOR 1ST CT SCAN BUT NOT 2ND, NO PREMEDS USED, PT UNCERTAIN OF CIRCUMSTANCES,,?POSSIBLE MRI CONTRAST ALLERGY, ALL STUDIES DONE "SOMEWHERE" IN [place]//A.C., Onset Date: [id number]   Iohexol Other (See Comments)     Code: HIVES, Desc: HIVES W/ "DYE" USED FOR 1ST CT SCAN BUT NOT 2ND, NO PREMEDS USED, PT UNCERTAIN OF CIRCUMSTANCES,,?POSSIBLE MRI CONTRAST ALLERGY, ALL STUDIES DONE "SOMEWHERE" IN [place]//A.C., Onset Date: [id number]    Iodine Hives   Naltrexone     Other reaction(s): Unknown   Follow-up Information    [name], FNP. Schedule an appointment as soon as possible for a visit in 1 week(s).   Specialty: General Practice Contact information: [address] [place] [address] [phone]            The results of significant diagnostics from this hospitalization (including imaging, microbiology, ancillary and laboratory) are listed below for reference.    Significant Diagnostic Studies: DG Lumbar Spine Complete  Result Date: 05/08/2020 CLINICAL DATA:  Fall. Pain. EXAM: LUMBAR SPINE - COMPLETE 4+ VIEW COMPARISON:  None. FINDINGS: Five non rib-bearing lumbar type vertebral bodies are present. Vertebral body heights and alignment are normal. Degenerative changes are noted in the lower lumbar facets. No acute or healing fractures are present. Atherosclerotic calcifications are present in the aorta and branch vessels. Dialysis catheter is in place. Surgical clips are present at the gallbladder fossa. The bowel gas pattern is normal. IMPRESSION: 1. Degenerative changes in the lower lumbar facets. 2. No acute or  healing fractures. 3. Atherosclerosis. Electronically Signed   By: San Morelle M.D.   On: 05/08/2020 04:56   DG Ankle Complete Left  Result Date: 05/07/2020 CLINICAL DATA:  Trip and fall. Left ankle pain.  Initial encounter. EXAM: LEFT ANKLE COMPLETE - 3+ VIEW COMPARISON:  None. FINDINGS: There is no evidence of fracture, dislocation, or joint effusion. There is no evidence of arthropathy. Small plantar and dorsal calcaneal bone spurs incidentally noted. Soft tissues are unremarkable. IMPRESSION: No acute findings. Electronically Signed   By: Marlaine Hind M.D.   On: 05/07/2020 13:47   DG Chest Portable 1 View  Result Date: 05/09/2020 CLINICAL DATA:  Shortness of breath EXAM: PORTABLE CHEST 1 VIEW COMPARISON:  March 03, 2019 FINDINGS: Again noted is cardiomegaly. Small bilateral pleural effusions are present. No large airspace consolidation is seen. A right-sided central venous catheter is seen with the tip at the superior cavoatrial junction. No acute osseous abnormality. IMPRESSION: Cardiomegaly and small bilateral pleural effusions. Electronically Signed   By: Prudencio Pair M.D.   On: 05/09/2020 00:44   DG Knee Complete 4 Views Left  Result Date: 05/07/2020 CLINICAL DATA:  Trip and fall. Left knee pain.  Initial encounter. EXAM: LEFT KNEE - COMPLETE 4+ VIEW COMPARISON:  None. FINDINGS: No evidence of fracture, dislocation, or joint effusion. No minimal degenerative spurring of medial and lateral compartments is seen without significant joint space narrowing. No other osseous abnormality identified. Peripheral arterial vascular calcification and surgical clips noted. IMPRESSION: No acute findings. Minimal degenerative spurring. Electronically Signed   By: Marlaine Hind M.D.   On: 05/07/2020 13:46   DG Knee Complete 4 Views Right  Result Date: 05/07/2020 CLINICAL DATA:  Trip and fall today. Right knee injury and pain. Initial encounter. EXAM: RIGHT KNEE - COMPLETE 4+ VIEW COMPARISON:  08/23/2008  [facility] FINDINGS: No evidence of fracture, dislocation, or joint effusion. Mild tricompartment degenerative spurring appear similar and there is no significant joint space narrowing. No other bone lesions identified. Peripheral arterial vascular calcification is new since previous study in 2009. IMPRESSION: 1. No acute findings. 2. Mild tricompartmental osteoarthritis. Electronically Signed   By: Marlaine Hind M.D.   On: 05/07/2020 13:49   DG Hip Unilat W or Wo Pelvis 2-3 Views Right  Result Date: 05/08/2020 CLINICAL DATA:  Fall. Right hip pain. EXAM: DG HIP (WITH OR WITHOUT PELVIS) 2-3V RIGHT COMPARISON:  None. FINDINGS: Pelvis is unremarkable. Right hip is located. No acute or healing fracture is present. Atherosclerotic changes are noted. IMPRESSION: 1. No acute or healing fracture. 2. Atherosclerosis. Electronically Signed   By: San Morelle M.D.   On: 05/08/2020 04:54    Microbiology: Recent Results (from the past 240 hour(s))  SARS Coronavirus 2 by RT PCR (hospital order, performed in [facility] hospital lab) Nasopharyngeal Nasopharyngeal Swab     Status: None   Collection Time: 05/09/20 12:27 AM   Specimen: Nasopharyngeal Swab  Result Value Ref Range Status   SARS Coronavirus 2 NEGATIVE NEGATIVE Final    Comment: (NOTE) SARS-CoV-2 target nucleic acids are NOT DETECTED. The SARS-CoV-2 RNA is generally detectable in upper and lower respiratory specimens during the acute phase of infection. The lowest concentration of SARS-CoV-2 viral copies this assay can detect is 250 copies / mL. A negative result does not preclude SARS-CoV-2 infection and should not be used as the sole basis for treatment or other patient management decisions.  A negative result may occur with improper specimen collection / handling, submission of specimen other than nasopharyngeal swab, presence of viral mutation(s) within the areas targeted by this assay, and inadequate number of viral  copies (<250 copies / mL). A negative result must be combined with clinical observations, patient history, and epidemiological information. Fact Sheet for Patients:   StrictlyIdeas.no Fact Sheet for Healthcare Providers: BankingDealers.co.za This test is not yet approved or cleared  by the [place] FDA and has been authorized for detection and/or diagnosis of SARS-CoV-2 by FDA under an Emergency Use Authorization (EUA).  This EUA will remain in effect (meaning this test can be used) for the duration of the COVID-19 declaration under Section 564(b)(1) of the Act, 21 U.S.C. section 360bbb-3(b)(1), unless the authorization is terminated or revoked sooner. Performed at  [facility], [place] [address] [place], [place] [address]      Labs: Basic Metabolic Panel: Recent Labs  Lab 05/09/20 0005 05/09/20 0708 05/09/20 1246 05/10/20 0631  NA 130* 133* 136 135  K >7.5* >7.5* 4.8 5.1  CL 95* 98 96* 97*  CO2 19* 20* 26 25  GLUCOSE 117* 101* 71 100*  BUN 83* 85* 19 35*  CREATININE 13.20* 13.59* 5.80* 8.44*  CALCIUM 9.6 9.3 9.1 9.1  PHOS  --  8.9*  --   --    Liver Function Tests: Recent Labs  Lab 05/09/20 0708  ALBUMIN 3.0*   No results for input(s): LIPASE, AMYLASE in the last 168 hours. No results for input(s): AMMONIA in the last 168 hours. CBC: Recent Labs  Lab 05/09/20 0005 05/09/20 1246  WBC 13.0* 12.3*  NEUTROABS 10.3*  --   HGB 11.3* 11.1*  HCT 35.9* 35.2*  MCV 94.2 93.1  PLT 250 221   Cardiac Enzymes: No results for input(s): CKTOTAL, CKMB, CKMBINDEX, TROPONINI in the last 168 hours. BNP: BNP (last 3 results) No results for input(s): BNP in the last 8760 hours.  ProBNP (last 3 results) No results for input(s): PROBNP in the last 8760 hours.  CBG: Recent Labs  Lab 05/09/20 0110 05/09/20 0232  GLUCAP 102* 94       Signed:  Alma Friendly, MD [facility] 05/10/2020, 1:54 PM

## 2020-05-10 NOTE — Plan of Care (Signed)
  Problem: Education: Goal: Knowledge of General Education information will improve Description: Including pain rating scale, medication(s)/side effects and non-pharmacologic comfort measures Outcome: Adequate for Discharge   Problem: Health Behavior/Discharge Planning: Goal: Ability to manage health-related needs will improve Outcome: Adequate for Discharge   Problem: Clinical Measurements: Goal: Ability to maintain clinical measurements within normal limits will improve Outcome: Adequate for Discharge Goal: Will remain free from infection Outcome: Adequate for Discharge Goal: Diagnostic test results will improve Outcome: Adequate for Discharge Goal: Respiratory complications will improve Outcome: Adequate for Discharge Goal: Cardiovascular complication will be avoided Outcome: Adequate for Discharge   Problem: Activity: Goal: Risk for activity intolerance will decrease Outcome: Adequate for Discharge   Problem: Nutrition: Goal: Adequate nutrition will be maintained Outcome: Adequate for Discharge   Problem: Coping: Goal: Level of anxiety will decrease Outcome: Adequate for Discharge   Problem: Elimination: Goal: Will not experience complications related to bowel motility Outcome: Adequate for Discharge Goal: Will not experience complications related to urinary retention Outcome: Adequate for Discharge   Problem: Pain Managment: Goal: General experience of comfort will improve Outcome: Adequate for Discharge   Problem: Safety: Goal: Ability to remain free from injury will improve Outcome: Adequate for Discharge   Problem: Skin Integrity: Goal: Risk for impaired skin integrity will decrease Outcome: Adequate for Discharge  Skin intact, no new pressure injuries noted. Patient verbalizes correctly medication schedule as well as indications for medications that are as needed. Discharge plan reviewed for transport to outpatient dialysis.

## 2020-05-11 ENCOUNTER — Telehealth: Payer: Self-pay

## 2020-05-11 ENCOUNTER — Telehealth: Payer: Self-pay | Admitting: Nephrology

## 2020-05-11 NOTE — Chronic Care Management (AMB) (Signed)
Chronic Care Management   Follow Up Note   05/10/2020 Name: Whitney Walker MRN: [id number] DOB: Mar 27, 1956  Referred by: [name], FNP Reason for referral : No chief complaint on file.   Whitney Walker is a 64 y.o. year old female who is a primary care patient of [name], FNP. The CCM team was consulted for assistance with chronic disease management and care coordination needs.    Review of patient status, including review of consultants reports, relevant laboratory and other test results, and collaboration with appropriate care team members and the patient's provider was performed as part of comprehensive patient evaluation and provision of chronic care management services.    SDOH (Social Determinants of Health) assessments performed: No See Care Plan activities for detailed interventions related to [place])   Inbound call received from patient with an update on her readmission for Acute respiratory failure with hypoxia and an elevated K+ of 7.5 after missing a HD treatment on Monday this week.     Outpatient Encounter Medications as of 05/10/2020  Medication Sig  . acetaminophen (TYLENOL) 500 MG tablet Take 1,000 mg by mouth 2 (two) times daily as needed for moderate pain or headache.  . albuterol (PROVENTIL HFA;VENTOLIN HFA) 108 (90 Base) MCG/ACT inhaler Inhale 2 puffs into the lungs every 6 (six) hours as needed for wheezing or shortness of breath.   [name] apixaban (ELIQUIS) 2.5 MG TABS tablet Take 1 tablet (2.5 mg total) by mouth 2 (two) times daily.  . Biotin 10000 MCG TABS Take 1 tablet by mouth daily.   . calcium citrate (CALCITRATE - DOSED IN MG ELEMENTAL CALCIUM) 950 MG tablet Take 200 mg of elemental calcium by mouth 3 (three) times daily. 2 tabs with dialysis on M,W,F  . carvedilol (COREG) 12.5 MG tablet Take 12.5 mg by mouth 2 (two) times daily with a meal. Only takes once a day on dialysis days (holds in morning on Monday Wednesday and Friday)   . meclizine  (ANTIVERT) 12.5 MG tablet Take 12.5 mg by mouth 3 (three) times daily as needed for dizziness.  . mometasone (ELOCON) 0.1 % cream Apply to affected area daily  . multivitamin (RENA-VIT) TABS tablet Take 1 tablet by mouth at bedtime.  . mupirocin ointment (BACTROBAN) 2 % Apply 1 application topically daily as needed (skin bumps).  . Nutritional Supplements (FEEDING SUPPLEMENT, NEPRO CARB STEADY,) LIQD Take 237 mLs by mouth 3 (three) times a week. During dialysis, Monday, Wednesday & Friday  . nystatin (NYSTATIN) powder Apply topically 4 (four) times daily.  . ondansetron (ZOFRAN ODT) 4 MG disintegrating tablet Take 1 tablet (4 mg total) by mouth every 6 (six) hours as needed. (Patient taking differently: Take 4 mg by mouth every 6 (six) hours as needed for nausea or vomiting. )  . oxyCODONE-acetaminophen (PERCOCET/ROXICET) 5-325 MG tablet Take 2 tablets by mouth every 4 (four) hours as needed for severe pain.  . sevelamer carbonate (RENVELA) 800 MG tablet Take 3,200 mg by mouth 3 (three) times daily with meals. Take 4 tablets with each meal  . temazepam (RESTORIL) 15 MG capsule Take 1 capsule (15 mg total) by mouth at bedtime as needed for sleep.   No facility-administered encounter medications on file as of 05/10/2020.     Objective:  Lab Results  Component Value Date   HGBA1C 5.1 06/04/2018   HGBA1C (H) 09/07/2009    6.5 (NOTE) The ADA recommends the following therapeutic goal for glycemic control related to Hgb A1c measurement: Goal of  therapy: <6.5 Hgb A1c  Reference: American Diabetes Association: Clinical Practice Recommendations 2010, Diabetes Care, 2010, 33: (Suppl  1).   Lab Results  Component Value Date   LDLCALC 97 06/04/2018   CREATININE 8.44 (H) 05/10/2020   BP Readings from Last 3 Encounters:  05/10/20 (!) 171/86  05/08/20 (!) 169/92  05/07/20 (!) 170/95    Goals Addressed      Patient Stated   . "I have been stumbling more frequently" (pt-stated)       Current  Barriers:  [name] Knowledge Deficits related to evaluation and treatment of Gait disturbance and Impaired Physical Mobility  . Chronic Disease Management support and education needs related to ESRD, HTN, CHF  . ESRD on HD . Lacks needed caregiver assistance   Nurse Case Manager Clinical Goal(s):  [name] Over the next 30 days, patient will work with PCP to address needs related to evaluation and treatment of patient reported episodes of "stumbling" and impaired gait disturbance  Goal Met  . New 05/08/20 Over the next 90 days, patient will work with the [place] CM and PCP on disease education and support to help improve [place] related to Impaired Mobility and Gait Disturbance with falls   CCM RN CM Interventions:  05/08/20 call completed with patient  . Evaluation of current treatment plan related to Impaired gait with recent fall and ED visit  and patient's adherence to plan as established by provider . Determined patient experienced a fall 2 days ago while dining out at [employer] with a friend when she missed stepping down 3 steps and landed her on her knees . Determined patient went to the ED for evaluation of her injury's via EMS and was advised she sustained a sprain to her left ankle but no further injury's were identified, she was d/c home with a Rx for pain medication being sent to her pharmacy . Determined about 24 hours later, Ms. Clegg developed Sciatica pain on her left side and the pain was more than she could handle, she called EMS and was further evaluated in the [facility] without further injury identified, she was given pain medications and d/c home, she missed dialysis today  . Determined patient did not have transportation over the weekend to make it to her pharmacy, she currently does not have the pain medication prescribed, on hand . Determined patient is not established with Orthopedics, however, her Sciatica pain is chronic and reoccurring if triggered . Discussed Ms. Ezzell  completed PT in the past for this condition as well as for Vertigo and found it to be effective . Determined her balance continues to be impaired and she uses her walker for ambulation  . Determined Ms. Loudenslager lives alone and has a daughter near by but she is limited on the assistance she can provide . Determined Ms. Meas has difficulty performing her Self Care independently as well as needing assistance to and from the car when using transportation for MD appts . Discussed she currently uses [employer] for transportation via [place], however, she does not receive assistance as needed . Determined she also has transportation via [place] but finds having to give a notice to be difficult and inconvenient . Determined Ms. Tiede completed an assessment per [employer]HS last year in hopes to be approved for [place] but was denied due to she had not experienced any falls prior to that time . Discussed the following plan with patient and she is agreeable: o 1. Send  Pharmacy referral to embedded Pharm D to review and assist patient with transferring her medications to a pharmacy who can deliver her medications (referral marked Urgent with efforts to help obtain patient's newly prescribed pain medication by end of business day) o 2. Send a BSW referral to [name] to assist with re-application for [place] and review transportation options for patient in order to receive assistance needed to and from vehicle when needing to transport o Send in basket message to PCP provider [name], FNP requesting in home PT to work with patient on Gait and balance, strengthening and teaching stretches to help reduce Sciatica pain; Request a CNA with this service in order to help patient with personal hygiene and bathing; Request an Orthopedic referral for ongoing management of degenerative disc and Sciatica pain  . Encouraged patient to rest and to use her walker at all time when  ambulating and to take her pain medication exactly as prescribed . Encouraged patient to limit her fluid intake and to strictly adhere to her renal diet since missing a dialysis treatment today, she verbalizes understanding  . Discussed plans with patient for ongoing care management follow up and provided patient with direct contact information for care management team  Patient Self Care Activities:  . Self administers medications as prescribed . Attends all scheduled provider appointments . Calls pharmacy for medication refills . Calls provider office for new concerns or questions  Please see past updates related to this goal by clicking on the "Past Updates" button in the selected goal      . "My dialysis treatments are not going very good" (pt-stated)       Current Barriers:  [name] Knowledge Deficits related to ESRD . Dysfunctional dialysis access  Nurse Case Manager Clinical Goal(s):  [name] Over the next 90 days, patient will work with the [place] to address needs related to ESRD for disease education related to hemodialysis treatment, fluid balance, dietary concerns and hemodialysis access  CCM RN CM Interventions:  05/11/19 inbound call completed with patient . Evaluation of current treatment plan related to ESRD and patient's adherence to plan as established by provider . Determined patient is still IP at [facility], admitted on 05/08/20 for Acute respiratory failure with hypoxia and with a K+ 7.5 . Reinforced the importance of keeping all HD treatment appointments due to risk of having Pulmonary edema and or Hyperkalemia with missed treatments, stressed this could lead to death . Determined patient received a HD treatment yesterday on 05/09/20 and will be d/c today, she will resume her regular HD appointments at the [facility], resuming a Monday, Wednesday, Friday schedule . Discussed plans with patient for ongoing care management follow up and  provided patient with direct contact information for care management team  Patient Self Care Activities:  . Self administers medications as prescribed . Attends all scheduled provider appointments . Calls pharmacy for medication refills . Attends church or other social activities . Performs ADL's independently . Performs IADL's independently . Calls provider office for new concerns or questions  Please see past updates related to this goal by clicking on the "Past Updates" button in the selected goal        Plan:   Telephone follow up appointment with care management team member scheduled for: 05/16/20  [name], RN, BSN, CCM Care Management Coordinator [place] [facility]  Direct Phone: [phone]

## 2020-05-11 NOTE — Telephone Encounter (Signed)
Transition of Care Contact from [place]   Date of Discharge: 05/10/20 Date of Contact: 05/11/20 Method of contact: phone Talked to patient   Patient contacted to discuss transition of care form recent hospitaliztion. Patient was admitted to [facility] from 5/10 to 5/12 with the discharge diagnosis of acute respiratory failure w/hypoxia.      Medication changes were reviewed - colace and Norco stopped.   Patient will follow up with is outpatient dialysis center 05/12/20.   Other follow up needs include: none.   Jen Mow, PA-C [facility] Pager: [phone]

## 2020-05-11 NOTE — Patient Instructions (Signed)
Visit Information  Goals Addressed      Patient Stated   . "I have been stumbling more frequently" (pt-stated)       Current Barriers:  [name] Knowledge Deficits related to evaluation and treatment of Gait disturbance and Impaired Physical Mobility  . Chronic Disease Management support and education needs related to ESRD, HTN, CHF  . ESRD on HD . Lacks needed caregiver assistance   Nurse Case Manager Clinical Goal(s):  [name] Over the next 30 days, patient will work with PCP to address needs related to evaluation and treatment of patient reported episodes of "stumbling" and impaired gait disturbance  Goal Met  . New 05/08/20 Over the next 90 days, patient will work with the [place] CM and PCP on disease education and support to help improve [place] related to Impaired Mobility and Gait Disturbance with falls   CCM RN CM Interventions:  05/08/20 call completed with patient  . Evaluation of current treatment plan related to Impaired gait with recent fall and ED visit  and patient's adherence to plan as established by provider . Determined patient experienced a fall 2 days ago while dining out at [employer] with a friend when she missed stepping down 3 steps and landed her on her knees . Determined patient went to the ED for evaluation of her injury's via EMS and was advised she sustained a sprain to her left ankle but no further injury's were identified, she was d/c home with a Rx for pain medication being sent to her pharmacy . Determined about 24 hours later, Ms. Rosene developed Sciatica pain on her left side and the pain was more than she could handle, she called EMS and was further evaluated in the [facility] without further injury identified, she was given pain medications and d/c home, she missed dialysis today  . Determined patient did not have transportation over the weekend to make it to her pharmacy, she currently does not have the pain medication prescribed, on hand . Determined patient  is not established with Orthopedics, however, her Sciatica pain is chronic and reoccurring if triggered . Discussed Ms. Tino completed PT in the past for this condition as well as for Vertigo and found it to be effective . Determined her balance continues to be impaired and she uses her walker for ambulation  . Determined Ms. Yearsley lives alone and has a daughter near by but she is limited on the assistance she can provide . Determined Ms. Ogburn has difficulty performing her Self Care independently as well as needing assistance to and from the car when using transportation for MD appts . Discussed she currently uses [employer] for transportation via [place], however, she does not receive assistance as needed . Determined she also has transportation via [place] but finds having to give a notice to be difficult and inconvenient . Determined Ms. Schiller completed an assessment per [employer]HS last year in hopes to be approved for [place] but was denied due to she had not experienced any falls prior to that time . Discussed the following plan with patient and she is agreeable: o 1. [place] referral to embedded Pharm D to review and assist patient with transferring her medications to a pharmacy who can deliver her medications (referral marked Urgent with efforts to help obtain patient's newly prescribed pain medication by end of business day) o 2. Send a BSW referral to [name] to assist with re-application for [place] and  review transportation options for patient in order to receive assistance needed to and from vehicle when needing to transport o Send in basket message to PCP provider Janece Moore, FNP requesting in home PT to work with patient on Gait and balance, strengthening and teaching stretches to help reduce Sciatica pain; Request a CNA with this service in order to help patient with personal hygiene and bathing; Request an Orthopedic referral for  ongoing management of degenerative disc and Sciatica pain  . Encouraged patient to rest and to use her walker at all time when ambulating and to take her pain medication exactly as prescribed . Encouraged patient to limit her fluid intake and to strictly adhere to her renal diet since missing a dialysis treatment today, she verbalizes understanding  . Discussed plans with patient for ongoing care management follow up and provided patient with direct contact information for care management team  Patient Self Care Activities:  . Self administers medications as prescribed . Attends all scheduled provider appointments . Calls pharmacy for medication refills . Calls provider office for new concerns or questions  Please see past updates related to this goal by clicking on the "Past Updates" button in the selected goal     . "My dialysis treatments are not going very good" (pt-stated)       Current Barriers:  . Knowledge Deficits related to ESRD . Dysfunctional dialysis access  Nurse Case Manager Clinical Goal(s):  . Over the next 90 days, patient will work with the CCM RNCM to address needs related to ESRD for disease education related to hemodialysis treatment, fluid balance, dietary concerns and hemodialysis access  CCM RN CM Interventions:  05/11/19 inbound call completed with patient . Evaluation of current treatment plan related to ESRD and patient's adherence to plan as established by provider . Determined patient is still IP at [facility], admitted on 05/08/20 for Acute respiratory failure with hypoxia and with a K+ 7.5 . Reinforced the importance of keeping all HD treatment appointments due to risk of having Pulmonary edema and or Hyperkalemia with missed treatments, stressed this could lead to death . Determined patient received a HD treatment yesterday on 05/09/20 and will be d/c today, she will resume her regular HD appointments at the [facility], resuming a Monday, Wednesday, Friday schedule . Discussed plans with patient for ongoing care management follow up and provided patient with direct contact information for care management team  Patient Self Care Activities:  . Self administers medications as prescribed . Attends all scheduled provider appointments . Calls pharmacy for medication refills . Attends church or other social activities . Performs ADL's independently . Performs IADL's independently . Calls provider office for new concerns or questions  Please see past updates related to this goal by clicking on the "Past Updates" button in the selected goal       Patient verbalizes understanding of instructions provided today.   Telephone follow up appointment with care management team member scheduled for: 05/16/20   , RN, BSN, CCM Care Management Coordinator [facility]  Direct Phone: [phone]    

## 2020-05-11 NOTE — Telephone Encounter (Signed)
Patient does not feel that she has any reason to come in for a hospital follow up. States that her reason for going in had to do with dialysis issues and there is nothing that Doreene Burke can do for that. She also fell and hurt her leg, but has already had an x-ray and that does not need to be addressed again. Patient declined to make an appointment for follow up.    Transition Care Management Follow-up Telephone Call  Date of discharge and from where: [facility]/10/2020 [facility]  How have you been since you were released from the hospital? ok  Any questions or concerns? No   Items Reviewed:  Did the pt receive and understand the discharge instructions provided? Yes   Medications obtained and verified? Yes   Any new allergies since your discharge? No   Dietary orders reviewed? Yes  Do you have support at home? No   Other (ie: DME, Home Health, etc) n/a  Functional Questionnaire: (I = Independent and D = Dependent) ADL's: I  Bathing/Dressing- I   Meal Prep- I  Eating- I  Maintaining continence- I  Transferring/Ambulation- I  Managing Meds- I   Follow up appointments reviewed:    PCP Hospital f/u appt confirmed? No  Patient declines to come in for follow up appointment   Are transportation arrangements needed? n/a  If their condition worsens, is the pt aware to call  their PCP or go to the ED? Yes  Was the patient provided with contact information for the PCP's office or ED? Yes  Was the pt encouraged to call back with questions or concerns? Yes

## 2020-05-12 ENCOUNTER — Ambulatory Visit: Payer: Self-pay

## 2020-05-12 DIAGNOSIS — Z23 Encounter for immunization: Secondary | ICD-10-CM | POA: Diagnosis not present

## 2020-05-12 DIAGNOSIS — N186 End stage renal disease: Secondary | ICD-10-CM | POA: Diagnosis not present

## 2020-05-12 DIAGNOSIS — I5032 Chronic diastolic (congestive) heart failure: Secondary | ICD-10-CM

## 2020-05-12 DIAGNOSIS — I10 Essential (primary) hypertension: Secondary | ICD-10-CM

## 2020-05-12 DIAGNOSIS — E875 Hyperkalemia: Secondary | ICD-10-CM | POA: Diagnosis not present

## 2020-05-12 DIAGNOSIS — D509 Iron deficiency anemia, unspecified: Secondary | ICD-10-CM | POA: Diagnosis not present

## 2020-05-12 DIAGNOSIS — I509 Heart failure, unspecified: Secondary | ICD-10-CM | POA: Diagnosis not present

## 2020-05-12 DIAGNOSIS — D631 Anemia in chronic kidney disease: Secondary | ICD-10-CM | POA: Diagnosis not present

## 2020-05-12 DIAGNOSIS — N2581 Secondary hyperparathyroidism of renal origin: Secondary | ICD-10-CM | POA: Diagnosis not present

## 2020-05-12 DIAGNOSIS — D689 Coagulation defect, unspecified: Secondary | ICD-10-CM | POA: Diagnosis not present

## 2020-05-12 DIAGNOSIS — Z992 Dependence on renal dialysis: Secondary | ICD-10-CM | POA: Diagnosis not present

## 2020-05-12 NOTE — Chronic Care Management (AMB) (Signed)
Chronic Care Management    Social Work Follow Up Note  05/12/2020 Name: Whitney Walker MRN: [id number] DOB: [birth year]  Whitney Walker is a 64 y.o. year old female who is a primary care patient of [name], [place]. The CCM team was consulted for assistance with care coordination.   Review of patient status, including review of consultants reports, other relevant assessments, and collaboration with appropriate care team members and the patient's provider was performed as part of comprehensive patient evaluation and provision of chronic care management services.    SDOH (Social Determinants of Health) assessments performed: No    Outpatient Encounter Medications as of 05/12/2020  Medication Sig  . acetaminophen (TYLENOL) 500 MG tablet Take 1,000 mg by mouth 2 (two) times daily as needed for moderate pain or headache.  . albuterol (PROVENTIL HFA;VENTOLIN HFA) 108 (90 Base) MCG/ACT inhaler Inhale 2 puffs into the lungs every 6 (six) hours as needed for wheezing or shortness of breath.   [name] apixaban (ELIQUIS) 2.5 MG TABS tablet Take 1 tablet (2.5 mg total) by mouth 2 (two) times daily.  . Biotin 10000 MCG TABS Take 1 tablet by mouth daily.   . calcium citrate (CALCITRATE - DOSED IN MG ELEMENTAL CALCIUM) 950 MG tablet Take 200 mg of elemental calcium by mouth 3 (three) times daily. 2 tabs with dialysis on M,W,F  . carvedilol (COREG) 12.5 MG tablet Take 12.5 mg by mouth 2 (two) times daily with a meal. Only takes once a day on dialysis days (holds in morning on Monday Wednesday and Friday)   . meclizine (ANTIVERT) 12.5 MG tablet Take 12.5 mg by mouth 3 (three) times daily as needed for dizziness.  . mometasone (ELOCON) 0.1 % cream Apply to affected area daily  . multivitamin (RENA-VIT) TABS tablet Take 1 tablet by mouth at bedtime.  . mupirocin ointment (BACTROBAN) 2 % Apply 1 application topically daily as needed (skin bumps).  . Nutritional Supplements (FEEDING SUPPLEMENT, NEPRO CARB  STEADY,) LIQD Take 237 mLs by mouth 3 (three) times a week. During dialysis, Monday, Wednesday & Friday  . nystatin (NYSTATIN) powder Apply topically 4 (four) times daily.  . ondansetron (ZOFRAN ODT) 4 MG disintegrating tablet Take 1 tablet (4 mg total) by mouth every 6 (six) hours as needed. (Patient taking differently: Take 4 mg by mouth every 6 (six) hours as needed for nausea or vomiting. )  . oxyCODONE-acetaminophen (PERCOCET/ROXICET) 5-325 MG tablet Take 2 tablets by mouth every 4 (four) hours as needed for severe pain.  . sevelamer carbonate (RENVELA) 800 MG tablet Take 3,200 mg by mouth 3 (three) times daily with meals. Take 4 tablets with each meal  . temazepam (RESTORIL) 15 MG capsule Take 1 capsule (15 mg total) by mouth at bedtime as needed for sleep.   No facility-administered encounter medications on file as of 05/12/2020.     Goals Addressed            This Visit's Progress     Patient Stated   . "I am not really wearing my CPAP" (pt-stated)       Current Barriers:  [name] Knowledge Deficits related to disease process and Self Health Management for OSA  . Non-adherence to wearing CPAP as directed . Improper CPAP Face Mask  Nurse Case Manager Clinical Goal(s):  [name] Over the next 60 days, patient will work with the CCM team  to address needs related to Nasal Pillows for use of CPAP  not met . New 05/12/20- Over the  next 45 days the patient will work with embedded care management team to obtain CPAP supplies including tubing and a nasal pillow  CCM SW Interventions: Completed 05/12/20 . Successful outbound call placed to the patient to assist with care coordination needs . Determined the patient is still in need of a nasal pillow for her CPAP machine o The patient also reports her hose has a crack which she is currently covering with duct tape . Advised the patient SW would collaborate with primary care provider to request orders for needed CPAP supplies . Collaboration with  [name], FNP to request orders for equipment needs . Scheduled follow up call over the next 10 days   CCM RN CM Interventions:  10/14/19 call completed with patient   . Evaluation of current treatment plan related to OSA and patient's adherence to plan as established by provider. . Provided education to patient re: the importance of wearing the CPAP machine every night and during naps exactly as directed; discussed potential complications that may occur from untreated OSA including Cardiovascular problems, Endocrine problems such as Diabetes, daytime sleepiness; discussed potential barriers for why patient is not wearing her CPAP; discussed patient admits her face mask is uncomfortable and does not fit properly; discussed patient is willing to try the Nasal Pillow . Collaborated with PCP provider [name], FNP regarding new order for Nasal Pillow to be faxed to DME supplier, [employer] - reply received confirming the order will be sent . Discussed plans with patient for ongoing care management follow up and provided patient with direct contact information for care management team . Provided patient with printed  educational materials related to OSA to be mailed to patient's home address  Patient Self Care Activities:  . Self administers medications as prescribed . Attends all scheduled provider appointments . Calls pharmacy for medication refills . Attends church or other social activities . Performs ADL's independently . Performs IADL's independently . Calls provider office for new concerns or questions   Please see past updates related to this goal by clicking on the "Past Updates" button in the selected goal        Other   . Collaborate with RN Care Manager to perform appropriate assessments to assist with care coordination needs   On track    [place] (see longitudinal plan of care for additional care plan information)  Current Barriers:  . Limited social  support . ADL IADL limitations . Lacks knowledge of community resource: [facility]  . Chronic conditions impacting ability to perform ADL and iADL needs including ESRD, HTN, and CHF . Frequent falls  Social Work Clinical Goal(s):  [name] Over the next 30 days the patient will work with SW to become more knowledgeable of transportation resources available to the patient Goal Closed due to patient being happy with current resources . Over the next 60 days the patient will work with SW to identify caregiver options to assist with ADL needs  CCM SW Interventions: Completed 05/12/20 . Inter-disciplinary care team collaboration (see longitudinal plan of care) . Successful outbound call placed to the patient to assist with care coordination needs . Reviewed patient current transportation needs o The patient uses [employer] and [employer] for transportation o The patient reports she does not need more transportation resources . Advised the patient SW has submitted an application on behalf of the patient to [facility] for personal care services . Scheduled follow up call over the next 10 days to assess goal  progression  Patient Self Care Activities:  . Self administers medications as prescribed . Attends all scheduled provider appointments . Calls pharmacy for medication refills . Calls provider office for new concerns or questions . Limited ability to perform ADL's and iADL's  Please see past updates related to this goal by clicking on the "Past Updates" button in the selected goal          Follow Up Plan: SW will follow up with patient by phone over the next 10 days.   [name], BSW, CDP Social Worker, Certified Dementia Practitioner [place] [facility] [place] [facility] [phone]  Total time spent performing care coordination and/or care management activities with the patient by phone or face to face = 25 minutes.

## 2020-05-12 NOTE — Patient Instructions (Signed)
Social Worker Visit Information  Goals we discussed today:  Goals Addressed            This Visit's Progress     Patient Stated   . "I am not really wearing my CPAP" (pt-stated)       Current Barriers:  [name] Knowledge Deficits related to disease process and Self Health Management for OSA  . Non-adherence to wearing CPAP as directed . Improper CPAP Face Mask  Nurse Case Manager Clinical Goal(s):  [name] Over the next 60 days, patient will work with the CCM team  to address needs related to Nasal Pillows for use of CPAP  not met . New 05/12/20- Over the next 45 days the patient will work with embedded care management team to obtain CPAP supplies including tubing and a nasal pillow  CCM SW Interventions: Completed 05/12/20 . Successful outbound call placed to the patient to assist with care coordination needs . Determined the patient is still in need of a nasal pillow for her CPAP machine o The patient also reports her hose has a crack which she is currently covering with duct tape . Advised the patient SW would collaborate with primary care provider to request orders for needed CPAP supplies . Collaboration with [name], FNP to request orders for equipment needs . Scheduled follow up call over the next 10 days   CCM RN CM Interventions:  10/14/19 call completed with patient   . Evaluation of current treatment plan related to OSA and patient's adherence to plan as established by provider. . Provided education to patient re: the importance of wearing the CPAP machine every night and during naps exactly as directed; discussed potential complications that may occur from untreated OSA including Cardiovascular problems, Endocrine problems such as Diabetes, daytime sleepiness; discussed potential barriers for why patient is not wearing her CPAP; discussed patient admits her face mask is uncomfortable and does not fit properly; discussed patient is willing to try the Nasal Pillow . Collaborated  with PCP provider [name], FNP regarding new order for Nasal Pillow to be faxed to DME supplier, [employer] - reply received confirming the order will be sent . Discussed plans with patient for ongoing care management follow up and provided patient with direct contact information for care management team . Provided patient with printed  educational materials related to OSA to be mailed to patient's home address  Patient Self Care Activities:  . Self administers medications as prescribed . Attends all scheduled provider appointments . Calls pharmacy for medication refills . Attends church or other social activities . Performs ADL's independently . Performs IADL's independently . Calls provider office for new concerns or questions   Please see past updates related to this goal by clicking on the "Past Updates" button in the selected goal        Other   . Collaborate with RN Care Manager to perform appropriate assessments to assist with care coordination needs   On track    [place] (see longitudinal plan of care for additional care plan information)  Current Barriers:  . Limited social support . ADL IADL limitations . Lacks knowledge of community resource: [facility]  . Chronic conditions impacting ability to perform ADL and iADL needs including ESRD, HTN, and CHF . Frequent falls  Social Work Clinical Goal(s):  [name] Over the next 30 days the patient will work with SW to become more knowledgeable of transportation resources available to the patient Goal Closed due to patient being  happy with current resources . Over the next 60 days the patient will work with SW to identify caregiver options to assist with ADL needs  CCM SW Interventions: Completed 05/12/20 . Inter-disciplinary care team collaboration (see longitudinal plan of care) . Successful outbound call placed to the patient to assist with care coordination needs . Reviewed patient current transportation needs o The  patient uses [employer] and [employer] for transportation o The patient reports she does not need more transportation resources . Advised the patient SW has submitted an application on behalf of the patient to [facility] for personal care services . Scheduled follow up call over the next 10 days to assess goal progression  Patient Self Care Activities:  . Self administers medications as prescribed . Attends all scheduled provider appointments . Calls pharmacy for medication refills . Calls provider office for new concerns or questions . Limited ability to perform ADL's and iADL's  Please see past updates related to this goal by clicking on the "Past Updates" button in the selected goal          Follow Up Plan: SW will follow up with patient by phone over the next 10 days.   [name], BSW, CDP Social Worker, Certified Dementia Practitioner [place] [facility] [place] [facility] [phone]

## 2020-05-15 DIAGNOSIS — D631 Anemia in chronic kidney disease: Secondary | ICD-10-CM | POA: Diagnosis not present

## 2020-05-15 DIAGNOSIS — N2581 Secondary hyperparathyroidism of renal origin: Secondary | ICD-10-CM | POA: Diagnosis not present

## 2020-05-15 DIAGNOSIS — D509 Iron deficiency anemia, unspecified: Secondary | ICD-10-CM | POA: Diagnosis not present

## 2020-05-15 DIAGNOSIS — Z992 Dependence on renal dialysis: Secondary | ICD-10-CM | POA: Diagnosis not present

## 2020-05-15 DIAGNOSIS — N186 End stage renal disease: Secondary | ICD-10-CM | POA: Diagnosis not present

## 2020-05-15 DIAGNOSIS — D689 Coagulation defect, unspecified: Secondary | ICD-10-CM | POA: Diagnosis not present

## 2020-05-15 DIAGNOSIS — Z23 Encounter for immunization: Secondary | ICD-10-CM | POA: Diagnosis not present

## 2020-05-16 ENCOUNTER — Encounter: Payer: Self-pay | Admitting: Nurse Practitioner

## 2020-05-16 ENCOUNTER — Telehealth (INDEPENDENT_AMBULATORY_CARE_PROVIDER_SITE_OTHER): Payer: Medicare Other | Admitting: Nurse Practitioner

## 2020-05-16 ENCOUNTER — Telehealth: Payer: Self-pay

## 2020-05-16 VITALS — BP 174/83 | Wt 196.9 lb

## 2020-05-16 DIAGNOSIS — M79605 Pain in left leg: Secondary | ICD-10-CM | POA: Diagnosis not present

## 2020-05-16 DIAGNOSIS — N186 End stage renal disease: Secondary | ICD-10-CM | POA: Diagnosis not present

## 2020-05-16 DIAGNOSIS — J9601 Acute respiratory failure with hypoxia: Secondary | ICD-10-CM

## 2020-05-16 DIAGNOSIS — W19XXXD Unspecified fall, subsequent encounter: Secondary | ICD-10-CM | POA: Diagnosis not present

## 2020-05-16 DIAGNOSIS — E875 Hyperkalemia: Secondary | ICD-10-CM

## 2020-05-16 NOTE — Progress Notes (Signed)
Virtual Visit via video   This visit type was conducted due to national recommendations for restrictions regarding the COVID-19 Pandemic (e.g. social distancing) in an effort to limit this patient's exposure and mitigate transmission in our community.  Due to her co-morbid illnesses, this patient is at least at moderate risk for complications without adequate follow up.  This format is felt to be most appropriate for this patient at this time.  All issues noted in this document were discussed and addressed.  A limited physical exam was performed with this format.    This visit type was conducted due to national recommendations for restrictions regarding the COVID-19 Pandemic (e.g. social distancing) in an effort to limit this patient's exposure and mitigate transmission in our community.  Patients identity confirmed using two different identifiers.  This format is felt to be most appropriate for this patient at this time.  All issues noted in this document were discussed and addressed.  No physical exam was performed (except for noted visual exam findings with Video Visits).    Date:  06/19/2020   ID:  Whitney Walker, DOB 08/26/1956, MRN [id number]  Patient Location:  Home - spoke with [name]  Provider location:   Office    Chief Complaint:  Follow up hospital visit  History of Present Illness:    Whitney Walker is a 64 y.o. female who presents via video conferencing for a telehealth visit today.    The patient does not have symptoms concerning for COVID-19 infection (fever, chills, cough, or new shortness of breath).   She is having a hospital follow up after having a fall and had a sprained ankle.    Later that night she did not realize she hurt her buttocks, she was unable to walk and was sciatic nerve on the right side.  Then she came home and was unable to breath, "felt like she was suffocating".  She stayed from Sunday until Monday did not have dialysis, and her  potassium level went up to 7.5 had dialysis.  Wednesday she went to dialysis just for a cleansing, unable to remove any fluid.  Reports recommended her to have PT.    She continues to have pain shooting to her right side.  She has been taking vicodin but was unable to tolerate and was given oxycodone, she vomited with 2 tablets. She has not had to take due to pain is better. She is using her walker.  Reports her left ankle is going down and had pain bilateral knees.     Past Medical History:  Diagnosis Date  . Anemia   . Anxiety   . CHF (congestive heart failure) ([place])   . Colon cancer [facility])    treatment surgery  . Complication of anesthesia    after first C- [place] "couldnt walk after", patient denies having a spinal  . COPD (chronic obstructive pulmonary disease) ([place])   . Coronary artery disease   . Depression   . DVT (deep venous thrombosis) ([place])   . ESRD (end stage renal disease) ([place])    Hemo: MWF  . History of blood transfusion 04/2018  . Hypertension    07/07/18- no longer takes BP medications  . Meningitis   . Pain in limb 07/30/2013  . PE (pulmonary embolism)   . Peripheral vascular disease ([place])   . Restless legs   . Shortness of breath    with exertion  . Sleep apnea   . SOB (shortness of breath) 03/03/2019  .  Vertigo    Past Surgical History:  Procedure Laterality Date  . ABDOMINAL AORTAGRAM N/A 07/26/2013   Procedure: ABDOMINAL Maxcine Ham;  Surgeon: [name] Romeo, MD;  Location: [facility] CATH LAB;  Service: Cardiovascular;  Laterality: N/A;  . ABDOMINAL HYSTERECTOMY    . AV FISTULA PLACEMENT Left 05/05/2018   Procedure: ARTERIOVENOUS (AV) FISTULA CREATION BRACHIOCEPHALIC;  Surgeon: Waynetta Sandy, MD;  Location: [place];  Service: Vascular;  Laterality: Left;  . AV FISTULA PLACEMENT Left 07/16/2018   Procedure: ARTERIOVENOUS FISTULA CREATION;  Surgeon: Waynetta Sandy, MD;  Location: [place];  Service: Vascular;  Laterality: Left;  . BASCILIC VEIN  TRANSPOSITION Left 09/03/2018   Procedure: BASILIC VEIN TRANSPOSITION SECOND STAGE;  Surgeon: Waynetta Sandy, MD;  Location: [place];  Service: Vascular;  Laterality: Left;  . BREAST BIOPSY Left   . CESAREAN SECTION     X 3 [id number]  . CHOLECYSTECTOMY    . CHOLECYSTECTOMY  1980/s  . COLECTOMY  2010  . DIVERTICULOSIS SURGERY-2002  2012  . FEMORAL-POPLITEAL BYPASS GRAFT Left 08/13/2013   Procedure: BYPASS GRAFT FEMORAL-POPLITEAL ARTERY WITH NON-REVERSED SAPHANEOUS VEIN; ULTRASOUND GUIDED;  Surgeon: Mal [name], MD;  Location: [place];  Service: Vascular;  Laterality: Left;  . INSERTION OF DIALYSIS CATHETER Right 04/30/2018   Procedure: INSERTION OF Right Internal Jugular DIALYSIS CATHETER;  Surgeon: [name], MD;  Location: [place];  Service: Vascular;  Laterality: Right;  . LOWER EXTREMITY ANGIOGRAM Left 07/26/2013   Procedure: LOWER EXTREMITY ANGIOGRAM;  Surgeon: [name] , MD;  Location: [facility] CATH LAB;  Service: Cardiovascular;  Laterality: Left;     Current Meds  Medication Sig  . acetaminophen (TYLENOL) 500 MG tablet Take 1,000 mg by mouth 2 (two) times daily as needed for moderate pain or headache.  . albuterol (PROVENTIL HFA;VENTOLIN HFA) 108 (90 Base) MCG/ACT inhaler Inhale 2 puffs into the lungs every 6 (six) hours as needed for wheezing or shortness of breath.   . Biotin 10000 MCG TABS Take 1 tablet by mouth daily.   . calcium citrate (CALCITRATE - DOSED IN MG ELEMENTAL CALCIUM) 950 MG tablet Take 200 mg of elemental calcium by mouth 3 (three) times daily. 2 tabs with dialysis on M,W,F  . carvedilol (COREG) 12.5 MG tablet Take 12.5 mg by mouth 2 (two) times daily with a meal. Only takes once a day on dialysis days (holds in morning on Monday Wednesday and Friday)   . meclizine (ANTIVERT) 12.5 MG tablet Take 12.5 mg by mouth 3 (three) times daily as needed for dizziness.  . mometasone (ELOCON) 0.1 % cream Apply to affected area daily  . multivitamin (RENA-VIT) TABS tablet  Take 1 tablet by mouth at bedtime.  . mupirocin ointment (BACTROBAN) 2 % Apply 1 application topically daily as needed (skin bumps).  . Nutritional Supplements (FEEDING SUPPLEMENT, NEPRO CARB STEADY,) LIQD Take 237 mLs by mouth 3 (three) times a week. During dialysis, Monday, Wednesday & Friday  . nystatin (NYSTATIN) powder Apply topically 4 (four) times daily.  [name] oxyCODONE-acetaminophen (PERCOCET/ROXICET) 5-325 MG tablet Take 2 tablets by mouth every 4 (four) hours as needed for severe pain. (Patient taking differently: Take 1 tablet by mouth every 4 (four) hours as needed for severe pain. )  . sevelamer carbonate (RENVELA) 800 MG tablet Take 3,200 mg by mouth 3 (three) times daily with meals. Take 4 tablets with each meal  . temazepam (RESTORIL) 15 MG capsule Take 1 capsule (15 mg total) by mouth at bedtime as needed  for sleep.  . [DISCONTINUED] apixaban (ELIQUIS) 2.5 MG TABS tablet Take 1 tablet (2.5 mg total) by mouth 2 (two) times daily.  . [DISCONTINUED] ondansetron (ZOFRAN ODT) 4 MG disintegrating tablet Take 1 tablet (4 mg total) by mouth every 6 (six) hours as needed. (Patient taking differently: Take 4 mg by mouth every 6 (six) hours as needed for nausea or vomiting. )     Allergies:   Carnosine, Gadolinium derivatives, Iohexol, Iodine, and Naltrexone   Social History   Tobacco Use  . Smoking status: Current Some Day Smoker    Packs/day: 0.00    Years: 40.00    Pack years: 0.00    Types: Cigarettes  . Smokeless tobacco: Former Systems developer  . Tobacco comment: has an occasional cigarette - mostly when around people  Vaping Use  . Vaping Use: Never used  Substance Use Topics  . Alcohol use: No    Alcohol/week: 0.0 standard drinks    Comment: she used to drink alcohol, quit in 2010   . Drug use: No     Family Hx: The patient's family history includes Bleeding Disorder in her sister; Cancer (age of onset: 12) in her cousin; Cancer (age of onset: 50) in her mother; Cancer (age of onset:  54) in her father; Hypertension in her daughter and sister. There is no history of Breast cancer.  ROS:   Please see the history of present illness.    Review of Systems  Constitutional: Negative.   Respiratory: Negative.   Cardiovascular: Negative.   Musculoskeletal: Negative.   Neurological: Negative.   Psychiatric/Behavioral: Negative.     All other systems reviewed and are negative.   Labs/Other Tests and Data Reviewed:    Recent Labs: 05/18/2020: ALT 27; BUN 38; Creatinine, Ser 6.98; Hemoglobin 11.1; Platelets 196; Potassium 5.1; Sodium 133   Recent Lipid Panel Lab Results  Component Value Date/Time   CHOL 191 06/04/2018 12:00 AM   TRIG 155 06/04/2018 12:00 AM   HDL 63 06/04/2018 12:00 AM   CHOLHDL 5.6 09/08/2009 04:03 AM   LDLCALC 97 06/04/2018 12:00 AM    Wt Readings from Last 3 Encounters:  05/18/20 197 lb 3.2 oz (89.4 kg)  05/16/20 196 lb 13.9 oz (89.3 kg)  05/10/20 196 lb 13.9 oz (89.3 kg)     Exam:    Vital Signs:  BP (!) 174/83 (BP Location: Right Wrist, Patient Position: Sitting, Cuff Size: Large)   Wt 196 lb 13.9 oz (89.3 kg)   BMI 37.20 kg/m     Physical Exam  Constitutional: She is oriented to person, place, and time and well-developed, well-nourished, and in no distress. No distress.  Pulmonary/Chest: Effort normal. No respiratory distress.  Neurological: She is alert and oriented to person, place, and time. No cranial nerve deficit.  Psychiatric: Mood, memory, affect and judgment normal.    ASSESSMENT & PLAN:    1. Left leg pain  After fall which has since resolved  2. Acute respiratory failure with hypoxia ([place])  Was on Bipap initially was discontinued shortly after arrival   She is doing well now.  TCM Performed. A member of the clinical team spoke with the patient upon dischare. Discharge summary was reviewed in full detail during the visit. Meds reconciled and compared to discharge meds. Medication list is updated and reviewed with  the patient.  Greater than 50% face to face time was spent in counseling an coordination of care.  All questions were answered to the satisfaction of the patient.  3. ESRD on dialysis [facility])  She had emergency dialysis at the hospital due to missing a day  She is now back on schedule.   She would like a transparent dressing for her catheter after dialysis treatments  4. Hyperkalemia  Was 7.5 at presentation improved to 6.98 by discharge  5. Fall, subsequent encounter  She had a fall while at a restaurant had xrays were negatie.     COVID-19 Education: The signs and symptoms of COVID-19 were discussed with the patient and how to seek care for testing (follow up with PCP or arrange E-visit).  The importance of social distancing was discussed today.  Patient Risk:   After full review of this patients clinical status, I feel that they are at least moderate risk at this time.  Time:   Today, I have spent 18 minutes/ seconds with the patient with telehealth technology discussing above diagnoses.     Medication Adjustments/Labs and Tests Ordered: Current medicines are reviewed at length with the patient today.  Concerns regarding medicines are outlined above.   Tests Ordered: No orders of the defined types were placed in this encounter.   Medication Changes: No orders of the defined types were placed in this encounter.   Disposition:  Follow up prn  Signed, [name], FNP

## 2020-05-17 ENCOUNTER — Other Ambulatory Visit: Payer: Self-pay

## 2020-05-17 ENCOUNTER — Telehealth: Payer: Self-pay

## 2020-05-17 ENCOUNTER — Ambulatory Visit: Payer: Self-pay

## 2020-05-17 DIAGNOSIS — N186 End stage renal disease: Secondary | ICD-10-CM | POA: Diagnosis not present

## 2020-05-17 DIAGNOSIS — D631 Anemia in chronic kidney disease: Secondary | ICD-10-CM | POA: Diagnosis not present

## 2020-05-17 DIAGNOSIS — N2581 Secondary hyperparathyroidism of renal origin: Secondary | ICD-10-CM | POA: Diagnosis not present

## 2020-05-17 DIAGNOSIS — D509 Iron deficiency anemia, unspecified: Secondary | ICD-10-CM | POA: Diagnosis not present

## 2020-05-17 DIAGNOSIS — Z23 Encounter for immunization: Secondary | ICD-10-CM | POA: Diagnosis not present

## 2020-05-17 DIAGNOSIS — I5032 Chronic diastolic (congestive) heart failure: Secondary | ICD-10-CM

## 2020-05-17 DIAGNOSIS — I10 Essential (primary) hypertension: Secondary | ICD-10-CM | POA: Diagnosis not present

## 2020-05-17 DIAGNOSIS — D689 Coagulation defect, unspecified: Secondary | ICD-10-CM | POA: Diagnosis not present

## 2020-05-17 DIAGNOSIS — Z992 Dependence on renal dialysis: Secondary | ICD-10-CM | POA: Diagnosis not present

## 2020-05-17 NOTE — Progress Notes (Signed)
[place]   [phone] [phone]   Clinic Follow up Note   Patient Care Team: [name], [place] as PCP - General ([place]) Waynetta Sandy, MD as Consulting Physician (Vascular Surgery) [facility], [facility] as Referring Physician (Nephrology) Edrick Oh, MD as Consulting Physician (Nephrology) Truitt Merle, MD as Consulting Physician (Hematology) [facility], [place], RN as [place] [facility] [place], Tillie Rung as Social Worker [place], Kennieth Francois, [facility] (Pharmacist)  Date of Service:  05/18/2020  CHIEF COMPLAINT: F/u anemia, history of thrombosis   CURRENT THERAPY:  Eliquis 2.5mg  bid (dose per renal function)   INTERVAL HISTORY:  [name] is here for a follow up. She was last seen by me 6 months ago. She presents to the clinic alone. She notes she is not doing well due to recent fall on steps with sprinted her left ankle and injured her knees on 05/07/20. She also notes flare of sciatica pain in left posterior hip. She notes she had difficulty breathing on 05/08/20. She is making decreased urine output. She notes she has not been taking pain medication, not even tylenol unless she has severe pain. She is still on Eliquis 2.5mg  BID. She notes when she saw Dr [name] her Phosphorus was 1.    REVIEW OF SYSTEMS:   Constitutional: Denies fevers, chills or abnormal weight loss Eyes: Denies blurriness of vision Ears, nose, mouth, throat, and face: Denies mucositis or sore throat Respiratory: Denies cough, dyspnea or wheezes Cardiovascular: Denies palpitation, chest discomfort or lower extremity swelling Gastrointestinal:  Denies nausea, heartburn or change in bowel habits Skin: Denies abnormal skin rashes MSK: (+) b/l knee pain, R>L and sprained left ankle (+) Left posterior hip pain  Lymphatics: Denies new lymphadenopathy or easy  bruising Neurological:Denies numbness, tingling or new weaknesses Behavioral/Psych: Mood is stable, no new changes  All other systems were reviewed with the patient and are negative.  MEDICAL HISTORY:  Past Medical History:  Diagnosis Date  . Anemia   . Anxiety   . CHF (congestive heart failure) ([place])   . Colon cancer [facility])    treatment surgery  . Complication of anesthesia    after first C- [place] "couldnt walk after", patient denies having a spinal  . COPD (chronic obstructive pulmonary disease) ([place])   . Coronary artery disease   . Depression   . DVT (deep venous thrombosis) ([place])   . ESRD (end stage renal disease) ([place])    Hemo: MWF  . History of blood transfusion 04/2018  . Hypertension    07/07/18- no longer takes BP medications  . Meningitis   . Pain in limb 07/30/2013  . PE (pulmonary embolism)   . Peripheral vascular disease ([place])   . Restless legs   . Shortness of breath    with exertion  . Sleep apnea   . SOB (shortness of breath) 03/03/2019  . Vertigo     SURGICAL HISTORY: Past Surgical History:  Procedure Laterality Date  . ABDOMINAL AORTAGRAM N/A 07/26/2013   Procedure: ABDOMINAL Maxcine Ham;  Surgeon: [name] Aspen Hill, MD;  Location: [facility] CATH LAB;  Service: Cardiovascular;  Laterality: N/A;  . ABDOMINAL HYSTERECTOMY    . AV FISTULA PLACEMENT Left 05/05/2018   Procedure: ARTERIOVENOUS (AV) FISTULA CREATION BRACHIOCEPHALIC;  Surgeon: Waynetta Sandy, MD;  Location: [place];  Service: Vascular;  Laterality: Left;  . AV FISTULA PLACEMENT Left 07/16/2018   Procedure: ARTERIOVENOUS FISTULA CREATION;  Surgeon:  Waynetta Sandy, MD;  Location: [place];  Service: Vascular;  Laterality: Left;  . BASCILIC VEIN TRANSPOSITION Left 09/03/2018   Procedure: BASILIC VEIN TRANSPOSITION SECOND STAGE;  Surgeon: Waynetta Sandy, MD;  Location: [place];  Service: Vascular;  Laterality: Left;  . BREAST BIOPSY Left   . CESAREAN SECTION     X 3 [id number]  .  CHOLECYSTECTOMY    . CHOLECYSTECTOMY  1980/s  . COLECTOMY  2010  . DIVERTICULOSIS SURGERY-2002  2012  . FEMORAL-POPLITEAL BYPASS GRAFT Left 08/13/2013   Procedure: BYPASS GRAFT FEMORAL-POPLITEAL ARTERY WITH NON-REVERSED SAPHANEOUS VEIN; ULTRASOUND GUIDED;  Surgeon: Mal [name], MD;  Location: [place];  Service: Vascular;  Laterality: Left;  . INSERTION OF DIALYSIS CATHETER Right 04/30/2018   Procedure: INSERTION OF Right Internal Jugular DIALYSIS CATHETER;  Surgeon: [name], MD;  Location: [place];  Service: Vascular;  Laterality: Right;  . LOWER EXTREMITY ANGIOGRAM Left 07/26/2013   Procedure: LOWER EXTREMITY ANGIOGRAM;  Surgeon: [name] Lewisburg, MD;  Location: [facility] CATH LAB;  Service: Cardiovascular;  Laterality: Left;    I have reviewed the social history and family history with the patient and they are unchanged from previous note.  ALLERGIES:  is allergic to carnosine; gadolinium derivatives; iohexol; iodine; and naltrexone.  MEDICATIONS:  Current Outpatient Medications  Medication Sig Dispense Refill  . acetaminophen (TYLENOL) 500 MG tablet Take 1,000 mg by mouth 2 (two) times daily as needed for moderate pain or headache.    . albuterol (PROVENTIL HFA;VENTOLIN HFA) 108 (90 Base) MCG/ACT inhaler Inhale 2 puffs into the lungs every 6 (six) hours as needed for wheezing or shortness of breath.     [name] apixaban (ELIQUIS) 2.5 MG TABS tablet Take 1 tablet (2.5 mg total) by mouth 2 (two) times daily. 180 tablet 3  . Biotin 10000 MCG TABS Take 1 tablet by mouth daily.     . calcium citrate (CALCITRATE - DOSED IN MG ELEMENTAL CALCIUM) 950 MG tablet Take 200 mg of elemental calcium by mouth 3 (three) times daily. 2 tabs with dialysis on M,W,F    . carvedilol (COREG) 12.5 MG tablet Take 12.5 mg by mouth 2 (two) times daily with a meal. Only takes once a day on dialysis days (holds in morning on Monday Wednesday and Friday)     . meclizine (ANTIVERT) 12.5 MG tablet Take 12.5 mg by mouth 3 (three)  times daily as needed for dizziness.    . mometasone (ELOCON) 0.1 % cream Apply to affected area daily 45 g 1  . multivitamin (RENA-VIT) TABS tablet Take 1 tablet by mouth at bedtime.    . mupirocin ointment (BACTROBAN) 2 % Apply 1 application topically daily as needed (skin bumps). 22 g 2  . Nutritional Supplements (FEEDING SUPPLEMENT, NEPRO CARB STEADY,) LIQD Take 237 mLs by mouth 3 (three) times a week. During dialysis, Monday, Wednesday & Friday    . nystatin (NYSTATIN) powder Apply topically 4 (four) times daily. 15 g 0  . ondansetron (ZOFRAN ODT) 4 MG disintegrating tablet Take 1 tablet (4 mg total) by mouth every 6 (six) hours as needed. (Patient taking differently: Take 4 mg by mouth every 6 (six) hours as needed for nausea or vomiting. ) 20 tablet 0  . oxyCODONE-acetaminophen (PERCOCET/ROXICET) 5-325 MG tablet Take 2 tablets by mouth every 4 (four) hours as needed for severe pain. (Patient taking differently: Take 1 tablet by mouth every 4 (four) hours as needed for severe pain. ) 16 tablet 0  .  sevelamer carbonate (RENVELA) 800 MG tablet Take 3,200 mg by mouth 3 (three) times daily with meals. Take 4 tablets with each meal    . temazepam (RESTORIL) 15 MG capsule Take 1 capsule (15 mg total) by mouth at bedtime as needed for sleep. 30 capsule 2   No current facility-administered medications for this visit.    PHYSICAL EXAMINATION: ECOG PERFORMANCE STATUS: 1 - Symptomatic but completely ambulatory  Vitals:   05/18/20 1052  BP: (!) 149/81  Pulse: 78  Resp: 20  Temp: (!) 97.5 F (36.4 C)  SpO2: 100%   Filed Weights   05/18/20 1052  Weight: 197 lb 3.2 oz (89.4 kg)   Due to COVID19 we will limit examination to appearance. Patient had no complaints.  GENERAL:alert, no distress and comfortable SKIN: skin color normal, no rashes or significant lesions EYES: normal, Conjunctiva are pink and non-injected, sclera clear  NEURO: alert & oriented x 3 with fluent speech  (+) Mild left  ankle swelling due to injury   LABORATORY DATA:  I have reviewed the data as listed CBC Latest Ref Rng & Units 05/18/2020 05/09/2020 05/09/2020  WBC 4.0 - 10.5 K/uL 9.7 12.3(H) 13.0(H)  Hemoglobin 12.0 - 15.0 g/dL 11.1(L) 11.1(L) 11.3(L)  Hematocrit 36.0 - 46.0 % 34.7(L) 35.2(L) 35.9(L)  Platelets 150 - 400 K/uL 196 221 250     CMP Latest Ref Rng & Units 05/18/2020 05/10/2020 05/09/2020  Glucose 70 - 99 mg/dL 99 100(H) 71  BUN 8 - 23 mg/dL 38(H) 35(H) 19  Creatinine 0.44 - 1.00 mg/dL 6.98(HH) 8.44(H) 5.80(H)  Sodium 135 - 145 mmol/L 133(L) 135 136  Potassium 3.5 - 5.1 mmol/L 5.1 5.1 4.8  Chloride 98 - 111 mmol/L 96(L) 97(L) 96(L)  CO2 22 - 32 mmol/L 27 25 26   Calcium 8.9 - 10.3 mg/dL 9.9 9.1 9.1  Total Protein 6.5 - 8.1 g/dL 7.6 - -  Total Bilirubin 0.3 - 1.2 mg/dL 0.3 - -  Alkaline Phos 38 - 126 U/L 121 - -  AST 15 - 41 U/L 22 - -  ALT 0 - 44 U/L 27 - -      RADIOGRAPHIC STUDIES: I have personally reviewed the radiological images as listed and agreed with the findings in the report. No results found.   ASSESSMENT & PLAN:  Whitney Walker is a 64 y.o. female with   1. Recurrent DVT and PE -She previously had episodes of PE, was treated with Xarelto. 10 months after she came off Xarelto, she developed unprovoked DVT and PE. -Ipreviouslyrecommendedher to continue anticoagulation indefinitely, if no contraindications. -Patient was previously on Coumadin, but switched to Eliquis due to her transportation issues with Coumadin check up. She takes Eliquis 2.5mg  BID, dose adjusted based on her ESRD on HD. -She previously stated she has been giving high dose hepatin during dialysis due to clotting issue, which is fine.  -She has been less active due to recent fall and LE injury. Labs reviewed, Hg 11.1. Iron panel still pending.  -I encouraged her to do exercise in chair or move as much as she can.  -Continue Eliquis 2.5mg  BID and continue to watch for blood clot.  -F/u yearly  now  2. Anemia of chronic disease and anemia of iron deficiency -likely related toherchronic kidney disease  -She takesoral iron supplement and IV iron as needed which is managed by her nephrologist at dialysis center. Per pt, she is also receiving EPO as needed during dialysis.  -I discussed she should not have  ESP if her Hg is over 11. I will copy note to Dr. [name].  -Stable, Hg 11.1 today (05/18/20)  3. History of colon cancer in 2009  -She has completed 5 years of surveillance.  -last coloscopy done by Dr. Collene Mares within the last 5 years according to patient.Before that was in 05/2012.  4. CKD, stage V, on HD -Sheunderwentfistula surgery with Dr. Donzetta Matters on 05/05/18 and shealso starteddialysis in 04/2018, currently [facility]. She is able to make small urine output.  -After recent fall on 05/07/20 she has SOB the day after. Resolved after urgent dialysis.   5. OSA, PVD, CHF -F/u with PCPand other specialists. -stable  6. Restless leg syndrome -F/u with PCP. Will continue Mirapex.  7. Anxiety and depression -She is being followed by a Dr. Ouida Sills for counseling.Mood is stable.   8. Recent Fall, Acute LE pain  -On 05/07/20 she fell on steps and injured b/l knees and sprained her left ankle. No fractures on Xrays  -She also had recent sciatica pain in her right posterior hip pain.  -She has bene trying to walk some at home. She does have a walker at home. She can use heating pad as needed.  -She has not been using pain medication.   Plan -Continue Eliquis 2.5mg  BID, refilled today  -Lab and f/u in 1 year    No problem-specific Assessment & Plan notes found for this encounter.   No orders of the defined types were placed in this encounter.  All questions were answered. The patient knows to call the clinic with any problems, questions or concerns. No barriers to learning was detected. The total time spent in the appointment was 25 minutes.     Truitt Merle, MD 05/18/2020    I, Joslyn Devon, am acting as scribe for Truitt Merle, MD.   I have reviewed the above documentation for accuracy and completeness, and I agree with the above.

## 2020-05-18 ENCOUNTER — Inpatient Hospital Stay: Payer: Medicare Other | Attending: Pain Medicine

## 2020-05-18 ENCOUNTER — Inpatient Hospital Stay (HOSPITAL_BASED_OUTPATIENT_CLINIC_OR_DEPARTMENT_OTHER): Payer: Medicare Other | Admitting: Hematology

## 2020-05-18 ENCOUNTER — Other Ambulatory Visit: Payer: Self-pay

## 2020-05-18 VITALS — BP 149/81 | HR 78 | Temp 97.5°F | Resp 20 | Ht 61.0 in | Wt 197.2 lb

## 2020-05-18 DIAGNOSIS — N183 Chronic kidney disease, stage 3 unspecified: Secondary | ICD-10-CM

## 2020-05-18 DIAGNOSIS — N186 End stage renal disease: Secondary | ICD-10-CM

## 2020-05-18 DIAGNOSIS — I509 Heart failure, unspecified: Secondary | ICD-10-CM | POA: Diagnosis not present

## 2020-05-18 DIAGNOSIS — N189 Chronic kidney disease, unspecified: Secondary | ICD-10-CM | POA: Diagnosis not present

## 2020-05-18 DIAGNOSIS — Z79899 Other long term (current) drug therapy: Secondary | ICD-10-CM | POA: Insufficient documentation

## 2020-05-18 DIAGNOSIS — I82412 Acute embolism and thrombosis of left femoral vein: Secondary | ICD-10-CM | POA: Diagnosis not present

## 2020-05-18 DIAGNOSIS — Z86718 Personal history of other venous thrombosis and embolism: Secondary | ICD-10-CM | POA: Diagnosis not present

## 2020-05-18 DIAGNOSIS — N184 Chronic kidney disease, stage 4 (severe): Secondary | ICD-10-CM

## 2020-05-18 DIAGNOSIS — D638 Anemia in other chronic diseases classified elsewhere: Secondary | ICD-10-CM | POA: Insufficient documentation

## 2020-05-18 DIAGNOSIS — D631 Anemia in chronic kidney disease: Secondary | ICD-10-CM

## 2020-05-18 DIAGNOSIS — F329 Major depressive disorder, single episode, unspecified: Secondary | ICD-10-CM | POA: Insufficient documentation

## 2020-05-18 DIAGNOSIS — Z992 Dependence on renal dialysis: Secondary | ICD-10-CM | POA: Diagnosis not present

## 2020-05-18 DIAGNOSIS — F419 Anxiety disorder, unspecified: Secondary | ICD-10-CM | POA: Diagnosis not present

## 2020-05-18 DIAGNOSIS — Z7901 Long term (current) use of anticoagulants: Secondary | ICD-10-CM | POA: Diagnosis not present

## 2020-05-18 DIAGNOSIS — I132 Hypertensive heart and chronic kidney disease with heart failure and with stage 5 chronic kidney disease, or end stage renal disease: Secondary | ICD-10-CM | POA: Insufficient documentation

## 2020-05-18 LAB — CBC WITH DIFFERENTIAL/PLATELET
Abs Immature Granulocytes: 0.03 10*3/uL (ref 0.00–0.07)
Basophils Absolute: 0 10*3/uL (ref 0.0–0.1)
Basophils Relative: 0 %
Eosinophils Absolute: 0.2 10*3/uL (ref 0.0–0.5)
Eosinophils Relative: 2 %
HCT: 34.7 % — ABNORMAL LOW (ref 36.0–46.0)
Hemoglobin: 11.1 g/dL — ABNORMAL LOW (ref 12.0–15.0)
Immature Granulocytes: 0 %
Lymphocytes Relative: 21 %
Lymphs Abs: 2 10*3/uL (ref 0.7–4.0)
MCH: 29.5 pg (ref 26.0–34.0)
MCHC: 32 g/dL (ref 30.0–36.0)
MCV: 92.3 fL (ref 80.0–100.0)
Monocytes Absolute: 0.9 10*3/uL (ref 0.1–1.0)
Monocytes Relative: 9 %
Neutro Abs: 6.6 10*3/uL (ref 1.7–7.7)
Neutrophils Relative %: 68 %
Platelets: 196 10*3/uL (ref 150–400)
RBC: 3.76 MIL/uL — ABNORMAL LOW (ref 3.87–5.11)
RDW: 15.7 % — ABNORMAL HIGH (ref 11.5–15.5)
WBC: 9.7 10*3/uL (ref 4.0–10.5)
nRBC: 0 % (ref 0.0–0.2)

## 2020-05-18 LAB — COMPREHENSIVE METABOLIC PANEL
ALT: 27 U/L (ref 0–44)
AST: 22 U/L (ref 15–41)
Albumin: 3.1 g/dL — ABNORMAL LOW (ref 3.5–5.0)
Alkaline Phosphatase: 121 U/L (ref 38–126)
Anion gap: 10 (ref 5–15)
BUN: 38 mg/dL — ABNORMAL HIGH (ref 8–23)
CO2: 27 mmol/L (ref 22–32)
Calcium: 9.9 mg/dL (ref 8.9–10.3)
Chloride: 96 mmol/L — ABNORMAL LOW (ref 98–111)
Creatinine, Ser: 6.98 mg/dL (ref 0.44–1.00)
GFR calc Af Amer: 7 mL/min — ABNORMAL LOW (ref >60)
GFR calc non Af Amer: 6 mL/min — ABNORMAL LOW (ref >60)
Glucose, Bld: 99 mg/dL (ref 70–99)
Potassium: 5.1 mmol/L (ref 3.5–5.1)
Sodium: 133 mmol/L — ABNORMAL LOW (ref 135–145)
Total Bilirubin: 0.3 mg/dL (ref 0.3–1.2)
Total Protein: 7.6 g/dL (ref 6.5–8.1)

## 2020-05-18 LAB — IRON AND TIBC
Iron: 76 ug/dL (ref 41–142)
Saturation Ratios: 30 % (ref 21–57)
TIBC: 249 ug/dL (ref 236–444)
UIBC: 173 ug/dL (ref 120–384)

## 2020-05-18 LAB — FERRITIN: Ferritin: 2077 ng/mL — ABNORMAL HIGH (ref 11–307)

## 2020-05-18 MED ORDER — APIXABAN 2.5 MG PO TABS: 2.5000 mg | ORAL_TABLET | Freq: Two times a day (BID) | ORAL | 3 refills | Status: DC

## 2020-05-18 NOTE — Patient Instructions (Signed)
Visit Information  Goals Addressed      Patient Stated   . "I am not really wearing my CPAP" (pt-stated)       Current Barriers:  [name] Knowledge Deficits related to disease process and Self Health Management for OSA  . Non-adherence to wearing CPAP as directed . Improper CPAP Face Mask  Nurse Case Manager Clinical Goal(s):  [name] Over the next 60 days, patient will work with the CCM team  to address needs related to Nasal Pillows for use of CPAP  not met . New 05/12/20- Over the next 45 days the patient will work with embedded care management team to obtain CPAP supplies including tubing and a nasal pillow  CCM SW Interventions: Completed 05/12/20 . Successful outbound call placed to the patient to assist with care coordination needs . Determined the patient is still in need of a nasal pillow for her CPAP machine o The patient also reports her hose has a crack which she is currently covering with duct tape . Advised the patient SW would collaborate with primary care provider to request orders for needed CPAP supplies . Collaboration with [name], FNP to request orders for equipment needs . Scheduled follow up call over the next 10 days   CCM RN CM Interventions:  05/18/20 call completed with patient  . Evaluation of current treatment plan related to OSA and patient's adherence to plan as established by provider . Determined patient received a call from the CPAP supplier regarding her replacement supplies; she will call the supplier back to confirm she needs supplies delivered to her home  . Discussed plans with patient for ongoing care management follow up and provided patient with direct contact information for care management team  Patient Self Care Activities:  . Self administers medications as prescribed . Attends all scheduled provider appointments . Calls pharmacy for medication refills . Attends church or other social activities . Performs ADL's independently . Performs IADL's  independently . Calls provider office for new concerns or questions  Please see past updates related to this goal by clicking on the "Past Updates" button in the selected goal      . "I have been stumbling more frequently" (pt-stated)       Current Barriers:  [name] Knowledge Deficits related to evaluation and treatment of Gait disturbance and Impaired Physical Mobility  . Chronic Disease Management support and education needs related to ESRD, HTN, CHF  . ESRD on HD . Lacks needed caregiver assistance   Nurse Case Manager Clinical Goal(s):  [name] Over the next 30 days, patient will work with PCP to address needs related to evaluation and treatment of patient reported episodes of "stumbling" and impaired gait disturbance  Goal Met  . New 05/08/20 Over the next 90 days, patient will work with the [place] CM and PCP on disease education and support to help improve Whitney Walker related to Impaired Mobility and Gait Disturbance with falls   CCM RN CM Interventions:  05/18/20 call completed with patient  . Evaluation of current treatment plan related to Impaired gait with recent fall and ED visit and patient's adherence to plan as established by provider . Determined patient received a PT evaluation while IP after being readmitted for Hyperkalemia . Discussed she will resume the HEP as directed by the IP PT and will not resume in home PT at this time . Determined patient has not fallen since last contact and was encouraged by her Hematologist to be more active  due to continued risk for DVT . Determined patient would like to request an Rx for a rollator walker to assist with ability to be more mobile w/o falls . Sent in basket message to PCP provider [name], FNP requesting an Rx for a rollator walker be faxed to [facility], advised Whitney Walker will not start in home PT at this time but does plan to do her daily stretches as directed by IP PT . Discussed plans with patient for ongoing care  management follow up and provided patient with direct contact information for care management team  Patient Self Care Activities:  . Self administers medications as prescribed . Attends all scheduled provider appointments . Calls pharmacy for medication refills . Calls provider office for new concerns or questions  Please see past updates related to this goal by clicking on the "Past Updates" button in the selected goal        Patient verbalizes understanding of instructions provided today.   Telephone follow up appointment with care management team member scheduled for: 06/29/20  [name], RN, BSN, CCM Care Management Coordinator [name]  Direct Phone: [phone]

## 2020-05-18 NOTE — Chronic Care Management (AMB) (Signed)
Chronic Care Management   Follow Up Note   05/17/2020 Name: Whitney Walker MRN: [id number] DOB: Apr 03, 1956  Referred by: [name], FNP Reason for referral : Chronic Care Management (FU RN CM Call - f/u on [facility] PT orders & Ortho referral )   Whitney Walker is a 64 y.o. year old female who is a primary care patient of [name], [place]. The CCM team was consulted for assistance with chronic disease management and care coordination needs.    Review of patient status, including review of consultants reports, relevant laboratory and other test results, and collaboration with appropriate care team members and the patient's provider was performed as part of comprehensive patient evaluation and provision of chronic care management services.    SDOH (Social Determinants of Health) assessments performed: No See Care Plan activities for detailed interventions related to [place])    Placed outbound CCM RN CM follow up call to patient.   Outpatient Encounter Medications as of 05/17/2020  Medication Sig  . acetaminophen (TYLENOL) 500 MG tablet Take 1,000 mg by mouth 2 (two) times daily as needed for moderate pain or headache.  . albuterol (PROVENTIL HFA;VENTOLIN HFA) 108 (90 Base) MCG/ACT inhaler Inhale 2 puffs into the lungs every 6 (six) hours as needed for wheezing or shortness of breath.   . Biotin 10000 MCG TABS Take 1 tablet by mouth daily.   . calcium citrate (CALCITRATE - DOSED IN MG ELEMENTAL CALCIUM) 950 MG tablet Take 200 mg of elemental calcium by mouth 3 (three) times daily. 2 tabs with dialysis on M,W,F  . carvedilol (COREG) 12.5 MG tablet Take 12.5 mg by mouth 2 (two) times daily with a meal. Only takes once a day on dialysis days (holds in morning on Monday Wednesday and Friday)   . meclizine (ANTIVERT) 12.5 MG tablet Take 12.5 mg by mouth 3 (three) times daily as needed for dizziness.  . mometasone (ELOCON) 0.1 % cream Apply to affected area daily  . multivitamin (RENA-VIT)  TABS tablet Take 1 tablet by mouth at bedtime.  . mupirocin ointment (BACTROBAN) 2 % Apply 1 application topically daily as needed (skin bumps).  . Nutritional Supplements (FEEDING SUPPLEMENT, NEPRO CARB STEADY,) LIQD Take 237 mLs by mouth 3 (three) times a week. During dialysis, Monday, Wednesday & Friday  . nystatin (NYSTATIN) powder Apply topically 4 (four) times daily.  . ondansetron (ZOFRAN ODT) 4 MG disintegrating tablet Take 1 tablet (4 mg total) by mouth every 6 (six) hours as needed. (Patient taking differently: Take 4 mg by mouth every 6 (six) hours as needed for nausea or vomiting. )  . oxyCODONE-acetaminophen (PERCOCET/ROXICET) 5-325 MG tablet Take 2 tablets by mouth every 4 (four) hours as needed for severe pain. (Patient taking differently: Take 1 tablet by mouth every 4 (four) hours as needed for severe pain. )  . sevelamer carbonate (RENVELA) 800 MG tablet Take 3,200 mg by mouth 3 (three) times daily with meals. Take 4 tablets with each meal  . temazepam (RESTORIL) 15 MG capsule Take 1 capsule (15 mg total) by mouth at bedtime as needed for sleep.  . [DISCONTINUED] apixaban (ELIQUIS) 2.5 MG TABS tablet Take 1 tablet (2.5 mg total) by mouth 2 (two) times daily.   No facility-administered encounter medications on file as of 05/17/2020.     Objective:  Lab Results  Component Value Date   HGBA1C 5.1 06/04/2018   HGBA1C (H) 09/07/2009    6.5 (NOTE) The ADA recommends the following therapeutic goal for glycemic  control related to Hgb A1c measurement: Goal of therapy: <6.5 Hgb A1c  Reference: American Diabetes Association: Clinical Practice Recommendations 2010, Diabetes Care, 2010, 33: (Suppl  1).   Lab Results  Component Value Date   LDLCALC 97 06/04/2018   CREATININE 6.98 (HH) 05/18/2020   BP Readings from Last 3 Encounters:  05/18/20 (!) 149/81  05/16/20 (!) 174/83  05/10/20 (!) 171/86    Goals Addressed      Patient Stated   . "I am not really wearing my CPAP"  (pt-stated)       Current Barriers:  [name] Knowledge Deficits related to disease process and Self Health Management for OSA  . Non-adherence to wearing CPAP as directed . Improper CPAP Face Mask  Nurse Case Manager Clinical Goal(s):  [name] Over the next 60 days, patient will work with the CCM team  to address needs related to Nasal Pillows for use of CPAP  not met . New 05/12/20- Over the next 45 days the patient will work with embedded care management team to obtain CPAP supplies including tubing and a nasal pillow  CCM SW Interventions: Completed 05/12/20 . Successful outbound call placed to the patient to assist with care coordination needs . Determined the patient is still in need of a nasal pillow for her CPAP machine o The patient also reports her hose has a crack which she is currently covering with duct tape . Advised the patient SW would collaborate with primary care provider to request orders for needed CPAP supplies . Collaboration with [name], FNP to request orders for equipment needs . Scheduled follow up call over the next 10 days  CCM RN CM Interventions:  05/18/20 call completed with patient  . Evaluation of current treatment plan related to OSA and patient's adherence to plan as established by provider . Determined patient received a call from the CPAP supplier regarding her replacement supplies; she will call the supplier back to confirm she needs supplies delivered to her home  . Discussed plans with patient for ongoing care management follow up and provided patient with direct contact information for care management team  Patient Self Care Activities:  . Self administers medications as prescribed . Attends all scheduled provider appointments . Calls pharmacy for medication refills . Attends church or other social activities . Performs ADL's independently . Performs IADL's independently . Calls provider office for new concerns or questions  Please see past updates  related to this goal by clicking on the "Past Updates" button in the selected goal     . "I have been stumbling more frequently" (pt-stated)       Current Barriers:  [name] Knowledge Deficits related to evaluation and treatment of Gait disturbance and Impaired Physical Mobility  . Chronic Disease Management support and education needs related to ESRD, HTN, CHF  . ESRD on HD . Lacks needed caregiver assistance   Nurse Case Manager Clinical Goal(s):  [name] Over the next 30 days, patient will work with PCP to address needs related to evaluation and treatment of patient reported episodes of "stumbling" and impaired gait disturbance  Goal Met  . New 05/08/20 Over the next 90 days, patient will work with the [place] CM and PCP on disease education and support to help improve [place] related to Impaired Mobility and Gait Disturbance with falls   CCM RN CM Interventions:  05/18/20 call completed with patient  . Evaluation of current treatment plan related to Impaired gait with recent fall and ED  visit and patient's adherence to plan as established by provider . Determined patient received a PT evaluation while IP after being readmitted for Hyperkalemia . Discussed she will resume the HEP as directed by the IP PT and will not resume in home PT at this time . Determined patient has not fallen since last contact and was encouraged by her Hematologist to be more active due to continued risk for DVT . Determined patient would like to request an Rx for a rollator walker to assist with ability to be more mobile w/o falls . Sent in basket message to PCP provider [name], FNP requesting an Rx for a rollator walker be faxed to [place], advised Janece Ms. [name] will not start in home PT at this time but does plan to do her daily stretches as directed by IP PT . Discussed plans with patient for ongoing care management follow up and provided patient with direct contact information for care management  team  Patient Self Care Activities:  . Self administers medications as prescribed . Attends all scheduled provider appointments . Calls pharmacy for medication refills . Calls provider office for new concerns or questions  Please see past updates related to this goal by clicking on the "Past Updates" button in the selected goal        Plan:   Telephone follow up appointment with care management team member scheduled for: 06/29/20  [name], RN, BSN, CCM Care Management Coordinator [place] [facility]  Direct Phone: [phone]

## 2020-05-18 NOTE — Progress Notes (Signed)
Critical Value: Creatinine: 6.98 Dr. Burr Medico notified Patient on hemodialysis

## 2020-05-19 ENCOUNTER — Telehealth: Payer: Self-pay | Admitting: Hematology

## 2020-05-19 ENCOUNTER — Encounter: Payer: Self-pay | Admitting: Hematology

## 2020-05-19 ENCOUNTER — Ambulatory Visit: Payer: Self-pay

## 2020-05-19 DIAGNOSIS — D509 Iron deficiency anemia, unspecified: Secondary | ICD-10-CM | POA: Diagnosis not present

## 2020-05-19 DIAGNOSIS — N2581 Secondary hyperparathyroidism of renal origin: Secondary | ICD-10-CM | POA: Diagnosis not present

## 2020-05-19 DIAGNOSIS — I5032 Chronic diastolic (congestive) heart failure: Secondary | ICD-10-CM

## 2020-05-19 DIAGNOSIS — N186 End stage renal disease: Secondary | ICD-10-CM | POA: Diagnosis not present

## 2020-05-19 DIAGNOSIS — Z23 Encounter for immunization: Secondary | ICD-10-CM | POA: Diagnosis not present

## 2020-05-19 DIAGNOSIS — Z992 Dependence on renal dialysis: Secondary | ICD-10-CM | POA: Diagnosis not present

## 2020-05-19 DIAGNOSIS — G4733 Obstructive sleep apnea (adult) (pediatric): Secondary | ICD-10-CM | POA: Diagnosis not present

## 2020-05-19 DIAGNOSIS — D631 Anemia in chronic kidney disease: Secondary | ICD-10-CM | POA: Diagnosis not present

## 2020-05-19 DIAGNOSIS — D689 Coagulation defect, unspecified: Secondary | ICD-10-CM | POA: Diagnosis not present

## 2020-05-19 NOTE — Telephone Encounter (Signed)
Scheduled appt per 5/20 los.  Spoke with pt and they are aware of the appt date and time. 

## 2020-05-19 NOTE — Chronic Care Management (AMB) (Signed)
Chronic Care Management    Social Work Follow Up Note  05/19/2020 Name: Melady Chow MRN: [id number] DOB: [birth year]  Whitney Walker is a 64 y.o. year old female who is a primary care patient of [name], [place]. The CCM team was consulted for assistance with care coordination.   Review of patient status, including review of consultants reports, other relevant assessments, and collaboration with appropriate care team members and the patient's provider was performed as part of comprehensive patient evaluation and provision of chronic care management services.    SDOH (Social Determinants of Health) assessments performed: No    Outpatient Encounter Medications as of 05/19/2020  Medication Sig  . acetaminophen (TYLENOL) 500 MG tablet Take 1,000 mg by mouth 2 (two) times daily as needed for moderate pain or headache.  . albuterol (PROVENTIL HFA;VENTOLIN HFA) 108 (90 Base) MCG/ACT inhaler Inhale 2 puffs into the lungs every 6 (six) hours as needed for wheezing or shortness of breath.   [name] apixaban (ELIQUIS) 2.5 MG TABS tablet Take 1 tablet (2.5 mg total) by mouth 2 (two) times daily.  . Biotin 10000 MCG TABS Take 1 tablet by mouth daily.   . calcium citrate (CALCITRATE - DOSED IN MG ELEMENTAL CALCIUM) 950 MG tablet Take 200 mg of elemental calcium by mouth 3 (three) times daily. 2 tabs with dialysis on M,W,F  . carvedilol (COREG) 12.5 MG tablet Take 12.5 mg by mouth 2 (two) times daily with a meal. Only takes once a day on dialysis days (holds in morning on Monday Wednesday and Friday)   . meclizine (ANTIVERT) 12.5 MG tablet Take 12.5 mg by mouth 3 (three) times daily as needed for dizziness.  . mometasone (ELOCON) 0.1 % cream Apply to affected area daily  . multivitamin (RENA-VIT) TABS tablet Take 1 tablet by mouth at bedtime.  . mupirocin ointment (BACTROBAN) 2 % Apply 1 application topically daily as needed (skin bumps).  . Nutritional Supplements (FEEDING SUPPLEMENT, NEPRO CARB  STEADY,) LIQD Take 237 mLs by mouth 3 (three) times a week. During dialysis, Monday, Wednesday & Friday  . nystatin (NYSTATIN) powder Apply topically 4 (four) times daily.  . ondansetron (ZOFRAN ODT) 4 MG disintegrating tablet Take 1 tablet (4 mg total) by mouth every 6 (six) hours as needed. (Patient taking differently: Take 4 mg by mouth every 6 (six) hours as needed for nausea or vomiting. )  . oxyCODONE-acetaminophen (PERCOCET/ROXICET) 5-325 MG tablet Take 2 tablets by mouth every 4 (four) hours as needed for severe pain. (Patient taking differently: Take 1 tablet by mouth every 4 (four) hours as needed for severe pain. )  . sevelamer carbonate (RENVELA) 800 MG tablet Take 3,200 mg by mouth 3 (three) times daily with meals. Take 4 tablets with each meal  . temazepam (RESTORIL) 15 MG capsule Take 1 capsule (15 mg total) by mouth at bedtime as needed for sleep.   No facility-administered encounter medications on file as of 05/19/2020.     Goals Addressed            This Visit's Progress     Patient Stated   . "I am not really wearing my CPAP" (pt-stated)   On track    Current Barriers:  [name] Knowledge Deficits related to disease process and Self Health Management for OSA  . Non-adherence to wearing CPAP as directed . Improper CPAP Face Mask  Nurse Case Manager Clinical Goal(s):  [name] Over the next 60 days, patient will work with the CCM team  to address needs related to Nasal Pillows for use of CPAP  not met . New 05/12/20- Over the next 45 days the patient will work with embedded care management team to obtain CPAP supplies including tubing and a nasal pillow  CCM SW Interventions: Completed 05/19/20 . Successful outbound call placed to the patient to assess goal progression . Determined the patient has been in contact with DME company in the last day and is expecting CPAP supplies to be delivered via mail . Encouraged the patient to contact SW if barriers arise . Scheduled outbound call  over the next two weeks to confirm supplies received   CCM RN CM Interventions:  05/18/20 call completed with patient  . Evaluation of current treatment plan related to OSA and patient's adherence to plan as established by provider . Determined patient received a call from the CPAP supplier regarding her replacement supplies; she will call the supplier back to confirm she needs supplies delivered to her home  . Discussed plans with patient for ongoing care management follow up and provided patient with direct contact information for care management team  Patient Self Care Activities:  . Self administers medications as prescribed . Attends all scheduled provider appointments . Calls pharmacy for medication refills . Attends church or other social activities . Performs ADL's independently . Performs IADL's independently . Calls provider office for new concerns or questions  Please see past updates related to this goal by clicking on the "Past Updates" button in the selected goal        Other   . Collaborate with RN Care Manager to perform appropriate assessments to assist with care coordination needs   On track    [place] (see longitudinal plan of care for additional care plan information)  Current Barriers:  . Limited social support . ADL IADL limitations . Lacks knowledge of community resource: SCAT  . Chronic conditions impacting ability to perform ADL and iADL needs including ESRD, HTN, and CHF . Frequent falls  Social Work Clinical Goal(s):  [name] Over the next 30 days the patient will work with SW to become more knowledgeable of transportation resources available to the patient Goal Closed due to patient being happy with current resources . Over the next 60 days the patient will work with SW to identify caregiver options to assist with ADL needs  CCM SW Interventions: Completed 05/19/20 . Inter-disciplinary care team collaboration (see longitudinal plan of  care) . Successful outbound call placed to the patient to determined goal progression . Confirmed the patient participated in a virtual visit with her primary provider whom informed the patient her application would be submitted to [facility] to obtain a PCS worker . Reviewed process in obtaining PCS worker through [facility] . Scheduled follow up call to the patient over the next two weeks to review goal progression and confirm contact with [facility]  Patient Self Care Activities:  . Self administers medications as prescribed . Attends all scheduled provider appointments . Calls pharmacy for medication refills . Calls provider office for new concerns or questions . Limited ability to perform ADL's and iADL's  Please see past updates related to this goal by clicking on the "Past Updates" button in the selected goal          Follow Up Plan: SW will follow up with patient by phone over the next two weeks.   [name], BSW, CDP Social Worker, Certified Dementia Practitioner [place] / [place]  [facility] [phone]  Total time spent performing care coordination and/or care management activities with the patient by phone or face to face = 10 minutes.

## 2020-05-19 NOTE — Patient Instructions (Signed)
Social Worker Visit Information  Goals we discussed today:  Goals Addressed            This Visit's Progress     Patient Stated   . "I am not really wearing my CPAP" (pt-stated)   On track    Current Barriers:  [name] Knowledge Deficits related to disease process and Self Health Management for OSA  . Non-adherence to wearing CPAP as directed . Improper CPAP Face Mask  Nurse Case Manager Clinical Goal(s):  [name] Over the next 60 days, patient will work with the CCM team  to address needs related to Nasal Pillows for use of CPAP  not met . New 05/12/20- Over the next 45 days the patient will work with embedded care management team to obtain CPAP supplies including tubing and a nasal pillow  CCM SW Interventions: Completed 05/19/20 . Successful outbound call placed to the patient to assess goal progression . Determined the patient has been in contact with DME company in the last day and is expecting CPAP supplies to be delivered via mail . Encouraged the patient to contact SW if barriers arise . Scheduled outbound call over the next two weeks to confirm supplies received   CCM RN CM Interventions:  05/18/20 call completed with patient  . Evaluation of current treatment plan related to OSA and patient's adherence to plan as established by provider . Determined patient received a call from the CPAP supplier regarding her replacement supplies; she will call the supplier back to confirm she needs supplies delivered to her home  . Discussed plans with patient for ongoing care management follow up and provided patient with direct contact information for care management team  Patient Self Care Activities:  . Self administers medications as prescribed . Attends all scheduled provider appointments . Calls pharmacy for medication refills . Attends church or other social activities . Performs ADL's independently . Performs IADL's independently . Calls provider office for new concerns or  questions  Please see past updates related to this goal by clicking on the "Past Updates" button in the selected goal        Other   . Collaborate with RN Care Manager to perform appropriate assessments to assist with care coordination needs   On track    [place] (see longitudinal plan of care for additional care plan information)  Current Barriers:  . Limited social support . ADL IADL limitations . Lacks knowledge of community resource: SCAT  . Chronic conditions impacting ability to perform ADL and iADL needs including ESRD, HTN, and CHF . Frequent falls  Social Work Clinical Goal(s):  [name] Over the next 30 days the patient will work with SW to become more knowledgeable of transportation resources available to the patient Goal Closed due to patient being happy with current resources . Over the next 60 days the patient will work with SW to identify caregiver options to assist with ADL needs  CCM SW Interventions: Completed 05/19/20 . Inter-disciplinary care team collaboration (see longitudinal plan of care) . Successful outbound call placed to the patient to determined goal progression . Confirmed the patient participated in a virtual visit with her primary provider whom informed the patient her application would be submitted to [facility] to obtain a PCS worker . Reviewed process in obtaining PCS worker through [facility] . Scheduled follow up call to the patient over the next two weeks to review goal progression and confirm contact with [facility]  Patient  Self Care Activities:  . Self administers medications as prescribed . Attends all scheduled provider appointments . Calls pharmacy for medication refills . Calls provider office for new concerns or questions . Limited ability to perform ADL's and iADL's  Please see past updates related to this goal by clicking on the "Past Updates" button in the selected goal         Follow Up Plan: SW  will follow up with patient by phone over the next two weeks.   [name], BSW, CDP Social Worker, Certified Dementia Practitioner [place] / [place] [facility] [phone]

## 2020-05-21 ENCOUNTER — Encounter: Payer: Self-pay | Admitting: Hematology

## 2020-05-22 DIAGNOSIS — N2581 Secondary hyperparathyroidism of renal origin: Secondary | ICD-10-CM | POA: Diagnosis not present

## 2020-05-22 DIAGNOSIS — Z992 Dependence on renal dialysis: Secondary | ICD-10-CM | POA: Diagnosis not present

## 2020-05-22 DIAGNOSIS — D689 Coagulation defect, unspecified: Secondary | ICD-10-CM | POA: Diagnosis not present

## 2020-05-22 DIAGNOSIS — D631 Anemia in chronic kidney disease: Secondary | ICD-10-CM | POA: Diagnosis not present

## 2020-05-22 DIAGNOSIS — D509 Iron deficiency anemia, unspecified: Secondary | ICD-10-CM | POA: Diagnosis not present

## 2020-05-22 DIAGNOSIS — Z23 Encounter for immunization: Secondary | ICD-10-CM | POA: Diagnosis not present

## 2020-05-22 DIAGNOSIS — N186 End stage renal disease: Secondary | ICD-10-CM | POA: Diagnosis not present

## 2020-05-24 DIAGNOSIS — Z992 Dependence on renal dialysis: Secondary | ICD-10-CM | POA: Diagnosis not present

## 2020-05-24 DIAGNOSIS — D509 Iron deficiency anemia, unspecified: Secondary | ICD-10-CM | POA: Diagnosis not present

## 2020-05-24 DIAGNOSIS — Z23 Encounter for immunization: Secondary | ICD-10-CM | POA: Diagnosis not present

## 2020-05-24 DIAGNOSIS — D631 Anemia in chronic kidney disease: Secondary | ICD-10-CM | POA: Diagnosis not present

## 2020-05-24 DIAGNOSIS — N2581 Secondary hyperparathyroidism of renal origin: Secondary | ICD-10-CM | POA: Diagnosis not present

## 2020-05-24 DIAGNOSIS — N186 End stage renal disease: Secondary | ICD-10-CM | POA: Diagnosis not present

## 2020-05-24 DIAGNOSIS — D689 Coagulation defect, unspecified: Secondary | ICD-10-CM | POA: Diagnosis not present

## 2020-05-25 ENCOUNTER — Other Ambulatory Visit: Payer: Self-pay

## 2020-05-25 ENCOUNTER — Ambulatory Visit: Payer: Medicare Other

## 2020-05-25 DIAGNOSIS — I10 Essential (primary) hypertension: Secondary | ICD-10-CM

## 2020-05-25 DIAGNOSIS — N186 End stage renal disease: Secondary | ICD-10-CM

## 2020-05-25 NOTE — Chronic Care Management (AMB) (Signed)
Chronic Care Management Pharmacy  Name: Whitney Walker  MRN: [id number] DOB: 12/12/1956  Chief Complaint/ HPI  Whitney Walker,  64 y.o. , female presents for their Initial CCM visit with the clinical pharmacist via telephone due to COVID-19 Pandemic.  PCP : [name], FNP  Their chronic conditions include: Chronic diastolic CHF, Hypertension, CKD  Office Visits: 05/16/20 Televisit: Presented for hospital follow up after fall and sprained ankle. Pt would like transparent dressing for catheter after dialysis treatments.   05/04/20 OV: Presented for hypertension. BP today was normal. Continue current medications. Pt reported trazodone ineffective for insomnia. Started on Temazepam 15mg  daily. Follow up in June.   01/04/20 OV: Presented for evaluation of insomnia. States that she has been able to fall asleep earlier. She takes trazodone 3-4 times per week as needed, but experiences restless legs when she takes it. Increase to 2 tablets daily.  Consult Visits: 05/18/20 Oncology OV w/ Dr. Burr Medico: Presented for follow up for anemia and history of thrombosis. Pt reported she was not doing well due to recent fall (5/9) and flare of sciatic pain. Reported difficulty breathing on 5/10 and has decreased urine output. Continue Eliquis 2.5mg  BID (dosed based on ESRD and HD). Pt takes oral and IV iron supplements as needed, managed by dialysis center. Also receiving EPO during dialysis. Follow up in 1 year.   05/08/20-05/10/20 ED admission: Presented with acute respirator failure with hypoxia and hyperkalemia likely related to skipped dialysis day. Xray showed cardiomegaly and small bilateral pleural effusions. Home health PT/OT recommended for left leg/ankle pain post fall. Follow up with PCP and continue with regular dialysis schedule.  05/07/20 ED visit: Presented with ankle and knee pain after fall. Advise rest, ice, and elevation for possible ankle sprain. Given Norco for a couple of days. Follow  up with PCP.   CCM Encounters: 05/19/20 SW: Care coordination regarding DME supplies for CPAP and transportation services.   05/17/20 RN: Pt requested rollator walker to assist with ambulation. Pt does not wish to resume in home PT at this time but will continue the HEP as directed by IP PT.   05/08/20 RN: Referred pt to PharmD for transferring Rxs to a pharmacy that can deliver medications. Also collaborated with SW.   Medications: Outpatient Encounter Medications as of 05/25/2020  Medication Sig  . acetaminophen (TYLENOL) 500 MG tablet Take 1,000 mg by mouth 2 (two) times daily as needed for moderate pain or headache.  . albuterol (PROVENTIL HFA;VENTOLIN HFA) 108 (90 Base) MCG/ACT inhaler Inhale 2 puffs into the lungs every 6 (six) hours as needed for wheezing or shortness of breath.   [name] apixaban (ELIQUIS) 2.5 MG TABS tablet Take 1 tablet (2.5 mg total) by mouth 2 (two) times daily.  . Biotin 10000 MCG TABS Take 1 tablet by mouth daily.   . calcium citrate (CALCITRATE - DOSED IN MG ELEMENTAL CALCIUM) 950 MG tablet Take 200 mg of elemental calcium by mouth 3 (three) times daily. 2 tabs with dialysis on M,W,F  . carvedilol (COREG) 12.5 MG tablet Take 12.5 mg by mouth 2 (two) times daily with a meal. Only takes once a day on dialysis days (holds in morning on Monday Wednesday and Friday)   . meclizine (ANTIVERT) 12.5 MG tablet Take 12.5 mg by mouth 3 (three) times daily as needed for dizziness.  . mometasone (ELOCON) 0.1 % cream Apply to affected area daily  . multivitamin (RENA-VIT) TABS tablet Take 1 tablet by mouth at bedtime.  [name]  mupirocin ointment (BACTROBAN) 2 % Apply 1 application topically daily as needed (skin bumps).  . Nutritional Supplements (FEEDING SUPPLEMENT, NEPRO CARB STEADY,) LIQD Take 237 mLs by mouth 3 (three) times a week. During dialysis, Monday, Wednesday & Friday  . nystatin (NYSTATIN) powder Apply topically 4 (four) times daily.  . ondansetron (ZOFRAN ODT) 4 MG  disintegrating tablet Take 1 tablet (4 mg total) by mouth every 6 (six) hours as needed.  [name] oxyCODONE-acetaminophen (PERCOCET/ROXICET) 5-325 MG tablet Take 2 tablets by mouth every 4 (four) hours as needed for severe pain. (Patient taking differently: Take 1 tablet by mouth every 4 (four) hours as needed for severe pain. )  . sevelamer carbonate (RENVELA) 800 MG tablet Take 3,200 mg by mouth 3 (three) times daily with meals. Take 4 tablets with each meal  . temazepam (RESTORIL) 15 MG capsule Take 1 capsule (15 mg total) by mouth at bedtime as needed for sleep.  . [DISCONTINUED] ondansetron (ZOFRAN ODT) 4 MG disintegrating tablet Take 1 tablet (4 mg total) by mouth every 6 (six) hours as needed. (Patient taking differently: Take 4 mg by mouth every 6 (six) hours as needed for nausea or vomiting. )   No facility-administered encounter medications on file as of 05/25/2020.    Current Diagnosis/Assessment:    Goals Addressed            This Visit's Progress   . Pharmacy Care Plan       CARE PLAN ENTRY (see longitudinal plan of care for additional care plan information)  Current Barriers:  . Chronic Disease Management support, education, and care coordination needs related to Hypertension, Hyperlipidemia, and Chronic Kidney Disease   Hypertension BP Readings from Last 3 Encounters:  05/18/20 (!) 149/81  05/16/20 (!) 174/83  05/10/20 (!) 171/86   . Pharmacist Clinical Goal(s): o Over the next 180 days, patient will work with PharmD and providers to achieve BP goal <130/80 . Current regimen:  o Carvedilol 12.5mg  twice daily (once daily on dialysis days) . Interventions: o Provided dietary and exercise recommendations o Consider additional blood pressure lowering medication if blood pressure remains elevated. Would need to consult dialysis before adding additional medication. . Patient self care activities - Over the next 180 days, patient will: o Check BP 2-3 times daily, document,  and provide at future appointments o Ensure daily salt intake < 2300 mg/day o Try to exercise for 30 minutes 5 times weekly  Hyperlipidemia Lab Results  Component Value Date/Time   LDLCALC 97 06/04/2018 12:00 AM   . Pharmacist Clinical Goal(s): o Over the next 120 days, patient will work with PharmD and providers to achieve LDL goal < 70 . Current regimen:  o N/A . Interventions: o Determine why atorvastatin was discontinued o Discuss starting atorvastatin or another cholesterol medication with PCP and patient at follow up . Patient self care activities - Over the next 120 days, patient will: o Focus on eating healthy and exercising  Chronic Kidney Disease on Dialysis . Pharmacist Clinical Goal(s) o Over the next 90 days, patient will work with PharmD and providers to minimize complications as a result of dialysis . Current regimen:  o Nephro Carb steady three times weekly during dialysis o Renvela 800mg  4 tablets 3 times daily with each meal . Interventions: o Recommend patient follow up with neurologist regarding complaint of losing memory from dialysis o Collaboration with PCP regarding patient's request to obtain bandages. Determined we are unable to obtain requested bandages, patient will have to get  from dialysis center . Patient self care activities - Over the next 90 days, patient will: o Consider neurologist follow up o Obtain bandages from current dialysis center  Medication management . Pharmacist Clinical Goal(s): o Over the next 90 days, patient will work with PharmD and providers to achieve optimal medication adherence . Current pharmacy: [facility] . Interventions o Comprehensive medication review performed. o Utilize [facility] for medication synchronization, packaging and delivery . Patient self care activities - Over the next 90 days, patient will: o Focus on medication adherence by using adherence packaging and medication delivery  services o Take medications as prescribed o Report any questions or concerns to PharmD and/or provider(s)  Initial goal documentation       Heart Failure   Type: Diastolic  Last ejection fraction: 65-70% 11/14 NYHA Class: II (slight limitation of activity)  Patient has failed these meds in past: Bystolic, labetalol, HCTZ Patient is currently controlled on the following medications:   Carvedilol 12.5mg  twice daily (once daily on dialysis days)  We discussed:  Weighing daily  Plan Continue current medications  Hypertension   Office blood pressures are  BP Readings from Last 3 Encounters:  05/18/20 (!) 149/81  05/16/20 (!) 174/83  05/10/20 (!) 171/86   Patient has failed these meds in the past: Amlodipine Patient is currently uncontrolled on the following medications:   Carvedilol 12.5mg  twice daily (only takes once daily on dialysis days)  Patient checks BP at home twice daily to three times daily  Patient home BP readings are ranging: 178/89, 148/77, 169/83  We discussed:  Diet extensively  Enjoys eating all types of vegetables  Eats chicken and seafood  Does not eat a lot of takeout  Does not have a meal schedule  Pt eats a lot of ice   Exercise extensively  Moves around in the house  Limited because her ankle is still sore from her recent fall  Recommend 30 minutes of exercise 5 times weekly as pt is able  BP goal < 130/80  Plan Continue current medications  Consider adding additional medication if BP remains uncontrolled   Hyperlipidemia   Lipid Panel     Component Value Date/Time   CHOL 191 06/04/2018 0000   TRIG 155 06/04/2018 0000   HDL 63 06/04/2018 0000   LDLCALC 97 06/04/2018 0000     The 10-year ASCVD risk score Mikey Bussing DC Jr., et al., 2013) is: 21.6%*   Values used to calculate the score:     Age: 81 years     Sex: Female     Is Non-Hispanic African American: Yes     Diabetic: No     Tobacco smoker: Yes     Systolic  Blood Pressure: 149 mmHg     Is BP treated: Yes     HDL Cholesterol: 63 mg/dL*     Total Cholesterol: 191 mg/dL*     * - Cholesterol units were assumed for this score calculation   Patient has failed these meds in past: Atorvastatin Patient is currently uncontrolled on the following medications:  . N/A  We discussed:  Diet and exercise extensively  Pt says she does not eat a lot of red meat  Plan Continue current medications  Discuss adding cholesterol medication with PCP and with pt at follow up visit due to LDL > 70.   Dialysis   Patient is currently on the following medications:   Nephro Carb steady during dialysis  Renvela 800mg  4 tablets 3 times daily  with each meal  We discussed:    Pt feels like she is losing her memory from dialysis treatments  Saw a neurologist a while ago   Recommend pt follow up with neurologist  Pt requested follow up on bandages for dialysis she was trying to get PCP to send in RX for  After collaboration with PCP determined, were not able to figure out how to get bandages. [place] listed on bandage pt provided, but was not able to obtain  Recommend pt obtain from dialysis center  Plan Continue current medications   Restless Legs Syndrome   We discussed:    Pt reports she experiences RLS often, especially during dialysis  Recommend magnesium cream applied topically   Plan Start magnesium cream  Vaccines   Reviewed and discussed patient's vaccination history.    Immunization History  Administered Date(s) Administered  . Fluad Quad(high Dose 65+) 10/04/2019  . Moderna SARS-COVID-2 Vaccination 03/10/2020, 04/07/2020   Plan Review at follow up visit  Medication Management   Pt uses [facility] for all medications Medication is delivered in adherence packaging Pt endorses 100% compliance  We discussed:   Importance of taking all medications daily as prescribed   Plan Utilize [facility] for  medication synchronization, packaging and delivery   Follow up: 6 week phone visit  Jannette Fogo, PharmD Clinical Pharmacist [facility] [phone]

## 2020-05-26 DIAGNOSIS — N186 End stage renal disease: Secondary | ICD-10-CM | POA: Diagnosis not present

## 2020-05-26 DIAGNOSIS — D689 Coagulation defect, unspecified: Secondary | ICD-10-CM | POA: Diagnosis not present

## 2020-05-26 DIAGNOSIS — D631 Anemia in chronic kidney disease: Secondary | ICD-10-CM | POA: Diagnosis not present

## 2020-05-26 DIAGNOSIS — N2581 Secondary hyperparathyroidism of renal origin: Secondary | ICD-10-CM | POA: Diagnosis not present

## 2020-05-26 DIAGNOSIS — D509 Iron deficiency anemia, unspecified: Secondary | ICD-10-CM | POA: Diagnosis not present

## 2020-05-26 DIAGNOSIS — Z992 Dependence on renal dialysis: Secondary | ICD-10-CM | POA: Diagnosis not present

## 2020-05-26 DIAGNOSIS — Z23 Encounter for immunization: Secondary | ICD-10-CM | POA: Diagnosis not present

## 2020-05-28 NOTE — Patient Instructions (Signed)
Visit Information  Goals Addressed            This Visit's Progress   . Medication Management       CARE PLAN ENTRY  Medication management . Pharmacist Clinical Goal(s): o Over the next 90 days, patient will work with PharmD and providers to achieve optimal medication adherence . [facility] . Interventions o Comprehensive medication review performed. o Utilize [facility] for medication synchronization, packaging and delivery . Patient self care activities - Over the next 90 days, patient will: o Focus on medication adherence by utilizing adherence packaging and medication synchronization o Take medications as prescribed o Report any questions or concerns to PharmD and/or provider(s)  Initial goal documentation        Ms. Fellman was given information about Chronic Care Management services today including:  1. CCM service includes personalized support from designated clinical staff supervised by her physician, including individualized plan of care and coordination with other care providers 2. 24/7 contact phone numbers for assistance for urgent and routine care needs. 3. Standard insurance, coinsurance, copays and deductibles apply for chronic care management only during months in which we provide at least 20 minutes of these services. Most insurances cover these services at 100%, however patients may be responsible for any copay, coinsurance and/or deductible if applicable. This service may help you avoid the need for more expensive face-to-face services. 4. Only one practitioner may furnish and bill the service in a calendar month. 5. The patient may stop CCM services at any time (effective at the end of the month) by phone call to the office staff.  Patient agreed to services and verbal consent obtained.   Patient verbalizes understanding of instructions provided today.  Telephone follow up appointment with pharmacy team member scheduled for:  05/25/20 @ [place], PharmD Clinical Pharmacist [facility] [phone]

## 2020-05-29 DIAGNOSIS — Z23 Encounter for immunization: Secondary | ICD-10-CM | POA: Diagnosis not present

## 2020-05-29 DIAGNOSIS — N186 End stage renal disease: Secondary | ICD-10-CM | POA: Diagnosis not present

## 2020-05-29 DIAGNOSIS — I129 Hypertensive chronic kidney disease with stage 1 through stage 4 chronic kidney disease, or unspecified chronic kidney disease: Secondary | ICD-10-CM | POA: Diagnosis not present

## 2020-05-29 DIAGNOSIS — D631 Anemia in chronic kidney disease: Secondary | ICD-10-CM | POA: Diagnosis not present

## 2020-05-29 DIAGNOSIS — N2581 Secondary hyperparathyroidism of renal origin: Secondary | ICD-10-CM | POA: Diagnosis not present

## 2020-05-29 DIAGNOSIS — Z992 Dependence on renal dialysis: Secondary | ICD-10-CM | POA: Diagnosis not present

## 2020-05-29 DIAGNOSIS — D509 Iron deficiency anemia, unspecified: Secondary | ICD-10-CM | POA: Diagnosis not present

## 2020-05-29 DIAGNOSIS — D689 Coagulation defect, unspecified: Secondary | ICD-10-CM | POA: Diagnosis not present

## 2020-05-31 DIAGNOSIS — D631 Anemia in chronic kidney disease: Secondary | ICD-10-CM | POA: Diagnosis not present

## 2020-05-31 DIAGNOSIS — D509 Iron deficiency anemia, unspecified: Secondary | ICD-10-CM | POA: Diagnosis not present

## 2020-05-31 DIAGNOSIS — N2581 Secondary hyperparathyroidism of renal origin: Secondary | ICD-10-CM | POA: Diagnosis not present

## 2020-05-31 DIAGNOSIS — Z992 Dependence on renal dialysis: Secondary | ICD-10-CM | POA: Diagnosis not present

## 2020-05-31 DIAGNOSIS — D689 Coagulation defect, unspecified: Secondary | ICD-10-CM | POA: Diagnosis not present

## 2020-05-31 DIAGNOSIS — N186 End stage renal disease: Secondary | ICD-10-CM | POA: Diagnosis not present

## 2020-06-01 ENCOUNTER — Ambulatory Visit (INDEPENDENT_AMBULATORY_CARE_PROVIDER_SITE_OTHER): Payer: Medicare Other

## 2020-06-01 DIAGNOSIS — N186 End stage renal disease: Secondary | ICD-10-CM

## 2020-06-01 DIAGNOSIS — I5032 Chronic diastolic (congestive) heart failure: Secondary | ICD-10-CM | POA: Diagnosis not present

## 2020-06-01 DIAGNOSIS — G4733 Obstructive sleep apnea (adult) (pediatric): Secondary | ICD-10-CM

## 2020-06-01 NOTE — Chronic Care Management (AMB) (Signed)
Chronic Care Management    Social Work Follow Up Note  06/01/2020 Name: Whitney Walker MRN: [id number] DOB: 03-23-1956  Whitney Walker is a 64 y.o. year old female who is a primary care patient of [name], [place]. The CCM team was consulted for assistance with care coordination.   Review of patient status, including review of consultants reports, other relevant assessments, and collaboration with appropriate care team members and the patient's provider was performed as part of comprehensive patient evaluation and provision of chronic care management services.    SDOH (Social Determinants of Health) assessments performed: No    Outpatient Encounter Medications as of 06/01/2020  Medication Sig  . acetaminophen (TYLENOL) 500 MG tablet Take 1,000 mg by mouth 2 (two) times daily as needed for moderate pain or headache.  . albuterol (PROVENTIL HFA;VENTOLIN HFA) 108 (90 Base) MCG/ACT inhaler Inhale 2 puffs into the lungs every 6 (six) hours as needed for wheezing or shortness of breath.   [name] apixaban (ELIQUIS) 2.5 MG TABS tablet Take 1 tablet (2.5 mg total) by mouth 2 (two) times daily.  . Biotin 10000 MCG TABS Take 1 tablet by mouth daily.   . calcium citrate (CALCITRATE - DOSED IN MG ELEMENTAL CALCIUM) 950 MG tablet Take 200 mg of elemental calcium by mouth 3 (three) times daily. 2 tabs with dialysis on M,W,F  . carvedilol (COREG) 12.5 MG tablet Take 12.5 mg by mouth 2 (two) times daily with a meal. Only takes once a day on dialysis days (holds in morning on Monday Wednesday and Friday)   . meclizine (ANTIVERT) 12.5 MG tablet Take 12.5 mg by mouth 3 (three) times daily as needed for dizziness.  . mometasone (ELOCON) 0.1 % cream Apply to affected area daily  . multivitamin (RENA-VIT) TABS tablet Take 1 tablet by mouth at bedtime.  . mupirocin ointment (BACTROBAN) 2 % Apply 1 application topically daily as needed (skin bumps).  . Nutritional Supplements (FEEDING SUPPLEMENT, NEPRO CARB  STEADY,) LIQD Take 237 mLs by mouth 3 (three) times a week. During dialysis, Monday, Wednesday & Friday  . nystatin (NYSTATIN) powder Apply topically 4 (four) times daily.  . ondansetron (ZOFRAN ODT) 4 MG disintegrating tablet Take 1 tablet (4 mg total) by mouth every 6 (six) hours as needed. (Patient taking differently: Take 4 mg by mouth every 6 (six) hours as needed for nausea or vomiting. )  . oxyCODONE-acetaminophen (PERCOCET/ROXICET) 5-325 MG tablet Take 2 tablets by mouth every 4 (four) hours as needed for severe pain. (Patient taking differently: Take 1 tablet by mouth every 4 (four) hours as needed for severe pain. )  . sevelamer carbonate (RENVELA) 800 MG tablet Take 3,200 mg by mouth 3 (three) times daily with meals. Take 4 tablets with each meal  . temazepam (RESTORIL) 15 MG capsule Take 1 capsule (15 mg total) by mouth at bedtime as needed for sleep.   No facility-administered encounter medications on file as of 06/01/2020.     Goals Addressed            This Visit's Progress     Patient Stated   . "I am not really wearing my CPAP" (pt-stated)   On track    Current Barriers:  [name] Knowledge Deficits related to disease process and Self Health Management for OSA  . Non-adherence to wearing CPAP as directed . Improper CPAP Face Mask  Nurse Case Manager Clinical Goal(s):  [name] Over the next 60 days, patient will work with the CCM team  to address needs related to Nasal Pillows for use of CPAP  not met . New 05/12/20- Over the next 45 days the patient will work with embedded care management team to obtain CPAP supplies including tubing and a nasal pillow Goal Met  . New 06/01/20- Over the next 60 days the patient will work with the  care management team to better manage OSA as evidenced by increased compliance of CPAP use  CCM SW Interventions: Completed 06/01/20 . Successful outbound call placed to the patient to assess goal progression . Determined the patient has received CPAP supplies  via mail . Assessed for accuracy of order; patient stated "they sent everything I needed" . Advised the patient to contact care management team as needed with future equipment needs . Collaboration with RN Care Manager regarding goal progression  Patient Self Care Activities:  . Self administers medications as prescribed . Attends all scheduled provider appointments . Calls pharmacy for medication refills . Attends church or other social activities . Performs ADL's independently . Performs IADL's independently . Calls provider office for new concerns or questions  Please see past updates related to this goal by clicking on the "Past Updates" button in the selected goal      . "I have been stumbling more frequently" (pt-stated)       Current Barriers:  [name] Knowledge Deficits related to evaluation and treatment of Gait disturbance and Impaired Physical Mobility  . Chronic Disease Management support and education needs related to ESRD, HTN, CHF  . ESRD on HD . Lacks needed caregiver assistance   Nurse Case Manager Clinical Goal(s):  [name] Over the next 30 days, patient will work with PCP to address needs related to evaluation and treatment of patient reported episodes of "stumbling" and impaired gait disturbance  Goal Met  . New 05/08/20 Over the next 90 days, patient will work with the [place] CM and PCP on disease education and support to help improve [place] related to Impaired Mobility and Gait Disturbance with falls   CCM SW Interventions: Completed 06/01/20 . Successful outbound call placed to the patient to assess goal progression . Determined the patient has yet to receive rollator . Collaboration with RN Care Manager regarding outcome of order request . Joint collaboration with patients primary care provider, [name] FNP, to request prescription be re-sent to [place] o Confirmation received from Mrs. Moore indicating orders sent via fax . Scheduled follow up call  over the next week to confirm receipt of orders   Patient Self Care Activities:  . Self administers medications as prescribed . Attends all scheduled provider appointments . Calls pharmacy for medication refills . Calls provider office for new concerns or questions  Please see past updates related to this goal by clicking on the "Past Updates" button in the selected goal        Other   . Collaborate with RN Care Manager to perform appropriate assessments to assist with care coordination needs   On track    [place] (see longitudinal plan of care for additional care plan information)  Current Barriers:  . Limited social support . ADL IADL limitations . Lacks knowledge of community resource: SC[facility]  . Chronic conditions impacting ability to perform ADL and iADL needs including ESRD, HTN, and CHF . Frequent falls  Social Work Clinical Goal(s):  [name] Over the next 30 days the patient will work with SW to become more knowledgeable of transportation resources available to the patient  Goal Closed due to patient being happy with current resources . Over the next 60 days the patient will work with SW to identify caregiver options to assist with ADL needs  CCM SW Interventions: Completed 06/01/20 . Successful outbound call placed to the patient to assess goal progression . Determined the patient has been in contact with [facility] and has been instructed to select a PCS provider from a list provided to the patient . Provided education on the opportunity to switch PCS providers should the patient feel it is not a good fit . Discussed importance of selecting PCS provider in a timely manner due to 30 day window provided after initial assessment to complete process . Concluded patient would contact [facility] over the next 3 days with selection . Advised the patient to contact SW as needed to assist with goal progression  Patient Self Care Activities:  . Self administers  medications as prescribed . Attends all scheduled provider appointments . Calls pharmacy for medication refills . Calls provider office for new concerns or questions . Limited ability to perform ADL's and iADL's  Please see past updates related to this goal by clicking on the "Past Updates" button in the selected goal          Follow Up Plan: SW will follow up with patient by phone over the next week.   [name], BSW, CDP Social Worker, Certified Dementia Practitioner [place] [facility] [phone]  Total time spent performing care coordination and/or care management activities with the patient by phone or face to face = 22 minutes.

## 2020-06-01 NOTE — Patient Instructions (Signed)
Social Worker Visit Information  Goals we discussed today:  Goals Addressed            This Visit's Progress     Patient Stated   . "I am not really wearing my CPAP" (pt-stated)   On track    Current Barriers:  [name] Knowledge Deficits related to disease process and Self Health Management for OSA  . Non-adherence to wearing CPAP as directed . Improper CPAP Face Mask  Nurse Case Manager Clinical Goal(s):  [name] Over the next 60 days, patient will work with the CCM team  to address needs related to Nasal Pillows for use of CPAP  not met . New 05/12/20- Over the next 45 days the patient will work with embedded care management team to obtain CPAP supplies including tubing and a nasal pillow Goal Met  . New 06/01/20- Over the next 60 days the patient will work with the  care management team to better manage OSA as evidenced by increased compliance of CPAP use  CCM SW Interventions: Completed 06/01/20 . Successful outbound call placed to the patient to assess goal progression . Determined the patient has received CPAP supplies via mail . Assessed for accuracy of order; patient stated "they sent everything I needed" . Advised the patient to contact care management team as needed with future equipment needs . Collaboration with RN Care Manager regarding goal progression   CCM RN CM Interventions:  05/18/20 call completed with patient  . Evaluation of current treatment plan related to OSA and patient's adherence to plan as established by provider . Determined patient received a call from the CPAP supplier regarding her replacement supplies; she will call the supplier back to confirm she needs supplies delivered to her home  . Discussed plans with patient for ongoing care management follow up and provided patient with direct contact information for care management team  Patient Self Care Activities:  . Self administers medications as prescribed . Attends all scheduled provider appointments . Calls  pharmacy for medication refills . Attends church or other social activities . Performs ADL's independently . Performs IADL's independently . Calls provider office for new concerns or questions  Please see past updates related to this goal by clicking on the "Past Updates" button in the selected goal      . "I have been stumbling more frequently" (pt-stated)       Current Barriers:  [name] Knowledge Deficits related to evaluation and treatment of Gait disturbance and Impaired Physical Mobility  . Chronic Disease Management support and education needs related to ESRD, HTN, CHF  . ESRD on HD . Lacks needed caregiver assistance   Nurse Case Manager Clinical Goal(s):  [name] Over the next 30 days, patient will work with PCP to address needs related to evaluation and treatment of patient reported episodes of "stumbling" and impaired gait disturbance  Goal Met  . New 05/08/20 Over the next 90 days, patient will work with the [place] CM and PCP on disease education and support to help improve [place] related to Impaired Mobility and Gait Disturbance with falls   CCM SW Interventions: Completed 06/01/20 . Successful outbound call placed to the patient to assess goal progression . Determined the patient has yet to receive rollator . Collaboration with RN Care Manager regarding outcome of order request . Joint collaboration with patients primary care provider, [name] FNP, to request prescription be re-sent to [facility] o Confirmation received from Mrs. Moore indicating orders sent via fax .  Scheduled follow up call over the next week to confirm receipt of orders   CCM RN CM Interventions:  05/18/20 call completed with patient  . Evaluation of current treatment plan related to Impaired gait with recent fall and ED visit and patient's adherence to plan as established by provider . Determined patient received a PT evaluation while IP after being readmitted for Hyperkalemia . Discussed she  will resume the HEP as directed by the IP PT and will not resume in home PT at this time . Determined patient has not fallen since last contact and was encouraged by her Hematologist to be more active due to continued risk for DVT . Determined patient would like to request an Rx for a rollator walker to assist with ability to be more mobile w/o falls . Sent in basket message to PCP provider [name], FNP requesting an Rx for a rollator walker be faxed to [place], advised Janece Ms. Bellmore will not start in home PT at this time but does plan to do her daily stretches as directed by IP PT . Discussed plans with patient for ongoing care management follow up and provided patient with direct contact information for care management team  Patient Self Care Activities:  . Self administers medications as prescribed . Attends all scheduled provider appointments . Calls pharmacy for medication refills . Calls provider office for new concerns or questions  Please see past updates related to this goal by clicking on the "Past Updates" button in the selected goal        Other   . Collaborate with RN Care Manager to perform appropriate assessments to assist with care coordination needs   On track    [place] (see longitudinal plan of care for additional care plan information)  Current Barriers:  . Limited social support . ADL IADL limitations . Lacks knowledge of community resource: SCAT  . Chronic conditions impacting ability to perform ADL and iADL needs including ESRD, HTN, and CHF . Frequent falls  Social Work Clinical Goal(s):  [name] Over the next 30 days the patient will work with SW to become more knowledgeable of transportation resources available to the patient Goal Closed due to patient being happy with current resources . Over the next 60 days the patient will work with SW to identify caregiver options to assist with ADL needs  CCM SW Interventions: Completed  06/01/20 . Successful outbound call placed to the patient to assess goal progression . Determined the patient has been in contact with [facility] and has been instructed to select a PCS provider from a list provided to the patient . Provided education on the opportunity to switch PCS providers should the patient feel it is not a good fit . Discussed importance of selecting PCS provider in a timely manner due to 30 day window provided after initial assessment to complete process . Concluded patient would contact [facility] over the next 3 days with selection . Advised the patient to contact SW as needed to assist with goal progression  Completed 05/19/20 . Inter-disciplinary care team collaboration (see longitudinal plan of care) . Successful outbound call placed to the patient to determined goal progression . Confirmed the patient participated in a virtual visit with her primary provider whom informed the patient her application would be submitted to [facility] to obtain a PCS worker . Reviewed process in obtaining PCS worker through [facility] . Scheduled follow up call to the patient  over the next two weeks to review goal progression and confirm contact with [facility]  Patient Self Care Activities:  . Self administers medications as prescribed . Attends all scheduled provider appointments . Calls pharmacy for medication refills . Calls provider office for new concerns or questions . Limited ability to perform ADL's and iADL's  Please see past updates related to this goal by clicking on the "Past Updates" button in the selected goal          Materials Provided: Verbal education about PCS program provided by phone  Follow Up Plan: SW will follow up with patient by phone over the next week.  [name], BSW, CDP Social Worker, Certified Dementia Practitioner [place] [facility] [place] [facility] [phone]

## 2020-06-02 DIAGNOSIS — N2581 Secondary hyperparathyroidism of renal origin: Secondary | ICD-10-CM | POA: Diagnosis not present

## 2020-06-02 DIAGNOSIS — Z992 Dependence on renal dialysis: Secondary | ICD-10-CM | POA: Diagnosis not present

## 2020-06-02 DIAGNOSIS — D509 Iron deficiency anemia, unspecified: Secondary | ICD-10-CM | POA: Diagnosis not present

## 2020-06-02 DIAGNOSIS — N186 End stage renal disease: Secondary | ICD-10-CM | POA: Diagnosis not present

## 2020-06-02 DIAGNOSIS — D689 Coagulation defect, unspecified: Secondary | ICD-10-CM | POA: Diagnosis not present

## 2020-06-02 DIAGNOSIS — D631 Anemia in chronic kidney disease: Secondary | ICD-10-CM | POA: Diagnosis not present

## 2020-06-05 DIAGNOSIS — D689 Coagulation defect, unspecified: Secondary | ICD-10-CM | POA: Diagnosis not present

## 2020-06-05 DIAGNOSIS — Z992 Dependence on renal dialysis: Secondary | ICD-10-CM | POA: Diagnosis not present

## 2020-06-05 DIAGNOSIS — N186 End stage renal disease: Secondary | ICD-10-CM | POA: Diagnosis not present

## 2020-06-05 DIAGNOSIS — N2581 Secondary hyperparathyroidism of renal origin: Secondary | ICD-10-CM | POA: Diagnosis not present

## 2020-06-05 DIAGNOSIS — D631 Anemia in chronic kidney disease: Secondary | ICD-10-CM | POA: Diagnosis not present

## 2020-06-05 DIAGNOSIS — D509 Iron deficiency anemia, unspecified: Secondary | ICD-10-CM | POA: Diagnosis not present

## 2020-06-07 ENCOUNTER — Ambulatory Visit: Payer: Self-pay

## 2020-06-07 ENCOUNTER — Other Ambulatory Visit: Payer: Self-pay

## 2020-06-07 DIAGNOSIS — D631 Anemia in chronic kidney disease: Secondary | ICD-10-CM | POA: Diagnosis not present

## 2020-06-07 DIAGNOSIS — I5032 Chronic diastolic (congestive) heart failure: Secondary | ICD-10-CM

## 2020-06-07 DIAGNOSIS — N186 End stage renal disease: Secondary | ICD-10-CM | POA: Diagnosis not present

## 2020-06-07 DIAGNOSIS — N2581 Secondary hyperparathyroidism of renal origin: Secondary | ICD-10-CM | POA: Diagnosis not present

## 2020-06-07 DIAGNOSIS — D509 Iron deficiency anemia, unspecified: Secondary | ICD-10-CM | POA: Diagnosis not present

## 2020-06-07 DIAGNOSIS — Z992 Dependence on renal dialysis: Secondary | ICD-10-CM | POA: Diagnosis not present

## 2020-06-07 DIAGNOSIS — D689 Coagulation defect, unspecified: Secondary | ICD-10-CM | POA: Diagnosis not present

## 2020-06-07 MED ORDER — ONDANSETRON 4 MG PO TBDP: 4.0000 mg | ORAL_TABLET | Freq: Four times a day (QID) | ORAL | 0 refills | Status: DC | PRN

## 2020-06-07 NOTE — Chronic Care Management (AMB) (Signed)
Chronic Care Management    Social Work Follow Up Note  06/07/2020 Name: Whitney Walker MRN: [id number] DOB: 1956-03-09  Whitney Walker is a 64 y.o. year old female who is a primary care patient of [name], [place]. The CCM team was consulted for assistance with care coordination.   Review of patient status, including review of consultants reports, other relevant assessments, and collaboration with appropriate care team members and the patient's provider was performed as part of comprehensive patient evaluation and provision of chronic care management services.    SDOH (Social Determinants of Health) assessments performed: No    Outpatient Encounter Medications as of 06/07/2020  Medication Sig  . acetaminophen (TYLENOL) 500 MG tablet Take 1,000 mg by mouth 2 (two) times daily as needed for moderate pain or headache.  . albuterol (PROVENTIL HFA;VENTOLIN HFA) 108 (90 Base) MCG/ACT inhaler Inhale 2 puffs into the lungs every 6 (six) hours as needed for wheezing or shortness of breath.   [name] apixaban (ELIQUIS) 2.5 MG TABS tablet Take 1 tablet (2.5 mg total) by mouth 2 (two) times daily.  . Biotin 10000 MCG TABS Take 1 tablet by mouth daily.   . calcium citrate (CALCITRATE - DOSED IN MG ELEMENTAL CALCIUM) 950 MG tablet Take 200 mg of elemental calcium by mouth 3 (three) times daily. 2 tabs with dialysis on M,W,F  . carvedilol (COREG) 12.5 MG tablet Take 12.5 mg by mouth 2 (two) times daily with a meal. Only takes once a day on dialysis days (holds in morning on Monday Wednesday and Friday)   . meclizine (ANTIVERT) 12.5 MG tablet Take 12.5 mg by mouth 3 (three) times daily as needed for dizziness.  . mometasone (ELOCON) 0.1 % cream Apply to affected area daily  . multivitamin (RENA-VIT) TABS tablet Take 1 tablet by mouth at bedtime.  . mupirocin ointment (BACTROBAN) 2 % Apply 1 application topically daily as needed (skin bumps).  . Nutritional Supplements (FEEDING SUPPLEMENT, NEPRO CARB  STEADY,) LIQD Take 237 mLs by mouth 3 (three) times a week. During dialysis, Monday, Wednesday & Friday  . nystatin (NYSTATIN) powder Apply topically 4 (four) times daily.  . ondansetron (ZOFRAN ODT) 4 MG disintegrating tablet Take 1 tablet (4 mg total) by mouth every 6 (six) hours as needed.  [name] oxyCODONE-acetaminophen (PERCOCET/ROXICET) 5-325 MG tablet Take 2 tablets by mouth every 4 (four) hours as needed for severe pain. (Patient taking differently: Take 1 tablet by mouth every 4 (four) hours as needed for severe pain. )  . sevelamer carbonate (RENVELA) 800 MG tablet Take 3,200 mg by mouth 3 (three) times daily with meals. Take 4 tablets with each meal  . temazepam (RESTORIL) 15 MG capsule Take 1 capsule (15 mg total) by mouth at bedtime as needed for sleep.  . [DISCONTINUED] ondansetron (ZOFRAN ODT) 4 MG disintegrating tablet Take 1 tablet (4 mg total) by mouth every 6 (six) hours as needed. (Patient taking differently: Take 4 mg by mouth every 6 (six) hours as needed for nausea or vomiting. )   No facility-administered encounter medications on file as of 06/07/2020.     Goals Addressed            This Visit's Progress     Patient Stated   . "I have been stumbling more frequently" (pt-stated)       Current Barriers:  [name] Knowledge Deficits related to evaluation and treatment of Gait disturbance and Impaired Physical Mobility  . Chronic Disease Management support and education needs related  to ESRD, HTN, CHF  . ESRD on HD . Lacks needed caregiver assistance   Nurse Case Manager Clinical Goal(s):  [name] Over the next 30 days, patient will work with PCP to address needs related to evaluation and treatment of patient reported episodes of "stumbling" and impaired gait disturbance  Goal Met  . New 05/08/20 Over the next 90 days, patient will work with the [place] CM and PCP on disease education and support to help improve [place] related to Impaired Mobility and Gait Disturbance with falls     CCM SW Interventions: Completed 06/07/20 . Successful outbound call placed to the patient to assess goal progression . Determined the patient has yet to receive rollator . Collaboration with representative "[place]" from [employer] o Informed by [employer] representative the patient has yet to receive rollator due to [place] being out of rollators over the past 1.5 weeks o Jacqulyn Liner reports plans to collaborate with team via e-mail to determine availability of equipment o Jacqulyn Liner reports once equipment availability is determined she or another teammate will outreach the patient  . Scheduled follow up call to the patient over the next week to determine outcome  Patient Self Care Activities:  . Self administers medications as prescribed . Attends all scheduled provider appointments . Calls pharmacy for medication refills . Calls provider office for new concerns or questions  Please see past updates related to this goal by clicking on the "Past Updates" button in the selected goal          Follow Up Plan: SW will follow up with patient by phone over the next week.   [name], BSW, CDP Social Worker, Certified Dementia Practitioner [place] / [place] Management [phone]  Total time spent performing care coordination and/or care management activities with the patient by phone or face to face = 27 minutes.

## 2020-06-07 NOTE — Patient Instructions (Signed)
Social Worker Visit Information  Goals we discussed today:  Goals Addressed            This Visit's Progress     Patient Stated   . "I have been stumbling more frequently" (pt-stated)       Current Barriers:  [name] Knowledge Deficits related to evaluation and treatment of Gait disturbance and Impaired Physical Mobility  . Chronic Disease Management support and education needs related to ESRD, HTN, CHF  . ESRD on HD . Lacks needed caregiver assistance   Nurse Case Manager Clinical Goal(s):  [name] Over the next 30 days, patient will work with PCP to address needs related to evaluation and treatment of patient reported episodes of "stumbling" and impaired gait disturbance  Goal Met  . New 05/08/20 Over the next 90 days, patient will work with the [place] CM and PCP on disease education and support to help improve [place] related to Impaired Mobility and Gait Disturbance with falls   CCM SW Interventions: Completed 06/07/20 . Successful outbound call placed to the patient to assess goal progression . Determined the patient has yet to receive rollator . Collaboration with representative "[place]" from [employer] o Informed by [employer] representative the patient has yet to receive rollator due to [place] being out of rollators over the past 1.5 weeks o Jacqulyn Liner reports plans to collaborate with team via e-mail to determine availability of equipment o Jacqulyn Liner reports once equipment availability is determined she or another teammate will outreach the patient  . Scheduled follow up call to the patient over the next week to determine outcome  Patient Self Care Activities:  . Self administers medications as prescribed . Attends all scheduled provider appointments . Calls pharmacy for medication refills . Calls provider office for new concerns or questions  Please see past updates related to this goal by clicking on the "Past Updates" button in the selected goal          Follow Up  Plan: SW will follow up with patient by phone over the next week.  [name], BSW, CDP Social Worker, Certified Dementia Practitioner [place] [facility] [place] Management [phone]

## 2020-06-08 ENCOUNTER — Other Ambulatory Visit: Payer: Self-pay | Admitting: Nurse Practitioner

## 2020-06-08 MED ORDER — ONDANSETRON 4 MG PO TBDP: 4.0000 mg | ORAL_TABLET | Freq: Four times a day (QID) | ORAL | 0 refills | Status: DC | PRN

## 2020-06-09 DIAGNOSIS — D631 Anemia in chronic kidney disease: Secondary | ICD-10-CM | POA: Diagnosis not present

## 2020-06-09 DIAGNOSIS — N2581 Secondary hyperparathyroidism of renal origin: Secondary | ICD-10-CM | POA: Diagnosis not present

## 2020-06-09 DIAGNOSIS — D509 Iron deficiency anemia, unspecified: Secondary | ICD-10-CM | POA: Diagnosis not present

## 2020-06-09 DIAGNOSIS — D689 Coagulation defect, unspecified: Secondary | ICD-10-CM | POA: Diagnosis not present

## 2020-06-09 DIAGNOSIS — N186 End stage renal disease: Secondary | ICD-10-CM | POA: Diagnosis not present

## 2020-06-09 DIAGNOSIS — Z992 Dependence on renal dialysis: Secondary | ICD-10-CM | POA: Diagnosis not present

## 2020-06-12 DIAGNOSIS — D689 Coagulation defect, unspecified: Secondary | ICD-10-CM | POA: Diagnosis not present

## 2020-06-12 DIAGNOSIS — N2581 Secondary hyperparathyroidism of renal origin: Secondary | ICD-10-CM | POA: Diagnosis not present

## 2020-06-12 DIAGNOSIS — D631 Anemia in chronic kidney disease: Secondary | ICD-10-CM | POA: Diagnosis not present

## 2020-06-12 DIAGNOSIS — Z992 Dependence on renal dialysis: Secondary | ICD-10-CM | POA: Diagnosis not present

## 2020-06-12 DIAGNOSIS — N186 End stage renal disease: Secondary | ICD-10-CM | POA: Diagnosis not present

## 2020-06-12 DIAGNOSIS — D509 Iron deficiency anemia, unspecified: Secondary | ICD-10-CM | POA: Diagnosis not present

## 2020-06-13 ENCOUNTER — Ambulatory Visit: Payer: Self-pay

## 2020-06-13 DIAGNOSIS — I5032 Chronic diastolic (congestive) heart failure: Secondary | ICD-10-CM

## 2020-06-13 DIAGNOSIS — N186 End stage renal disease: Secondary | ICD-10-CM | POA: Diagnosis not present

## 2020-06-13 DIAGNOSIS — R2689 Other abnormalities of gait and mobility: Secondary | ICD-10-CM

## 2020-06-13 NOTE — Patient Instructions (Signed)
Social Worker Visit Information  Goals we discussed today:  Goals Addressed              This Visit's Progress     Patient Stated   .  "I have been stumbling more frequently" (pt-stated)        Current Barriers:  [name] Knowledge Deficits related to evaluation and treatment of Gait disturbance and Impaired Physical Mobility  . Chronic Disease Management support and education needs related to ESRD, HTN, CHF  . ESRD on HD . Lacks needed caregiver assistance   Nurse Case Manager Clinical Goal(s):  [name] Over the next 30 days, patient will work with PCP to address needs related to evaluation and treatment of patient reported episodes of "stumbling" and impaired gait disturbance  Goal Met  . New 05/08/20 Over the next 90 days, patient will work with the [place] CM and PCP on disease education and support to help improve [place] related to Impaired Mobility and Gait Disturbance with falls   CCM SW Interventions: Completed 06/13/20 . Completed call with patient; determined the patient has yet to be contacted by [place] regarding rollator delivery . Successful outbound call placed to [place]; spoke with Pamala Hurry o Determined the patient has yet to receive delivery of rollator due to a backorder of supplies in the warehouse which ships equipment o Discussed the patient is eligible to pick up equipment from a retail store if desired o SW requested Pamala Hurry to contact the patient to review options to obtain equipment . Scheduled follow up call over the next week to confirm patient contact with [employer] care and knowledge of barriers to equipment delivery . Collaboration with RN Care Manager to update on goal progression  Patient Self Care Activities:  . Self administers medications as prescribed . Attends all scheduled provider appointments . Calls pharmacy for medication refills . Calls provider office for new concerns or questions  Please see past updates related to this goal by  clicking on the "Past Updates" button in the selected goal        Other   .  COMPLETED: Collaborate with RN Care Manager to perform appropriate assessments to assist with care coordination needs        CARE PLAN ENTRY (see longitudinal plan of care for additional care plan information)  Current Barriers:  . Limited social support . ADL IADL limitations . Lacks knowledge of community resource: SCAT  . Chronic conditions impacting ability to perform ADL and iADL needs including ESRD, HTN, and CHF . Frequent falls  Social Work Clinical Goal(s):  [name] Over the next 30 days the patient will work with SW to become more knowledgeable of transportation resources available to the patient Goal Closed due to patient being happy with current resources . Over the next 60 days the patient will work with SW to identify caregiver options to assist with ADL needs  CCM SW Interventions: Completed 06/13/20 . Completed call with patient . Determined the patient was denied PCS hours from [facility] following assessment completion o Patient reports she is not interested in an appeal at this time . Goal closure with option to reopen as patient declines in functional ability  Patient Self Care Activities:  . Self administers medications as prescribed . Attends all scheduled provider appointments . Calls pharmacy for medication refills . Calls provider office for new concerns or questions . Limited ability to perform ADL's and iADL's  Please see past updates related to this goal  by clicking on the "Past Updates" button in the selected goal           Follow Up Plan: SW will follow up with patient by phone over the next week.   [name], BSW, CDP Social Worker, Certified Dementia Practitioner [place] / [place] Management [phone]

## 2020-06-13 NOTE — Chronic Care Management (AMB) (Signed)
Chronic Care Management    Social Work Follow Up Note  06/13/2020 Name: Whitney Walker MRN: [id number] DOB: 1956-04-27  Whitney Walker is a 64 y.o. year old female who is a primary care patient of [name], [place]. The CCM team was consulted for assistance with care coordination.   Review of patient status, including review of consultants reports, other relevant assessments, and collaboration with appropriate care team members and the patient's provider was performed as part of comprehensive patient evaluation and provision of chronic care management services.    SDOH (Social Determinants of Health) assessments performed: No    Outpatient Encounter Medications as of 06/13/2020  Medication Sig  . acetaminophen (TYLENOL) 500 MG tablet Take 1,000 mg by mouth 2 (two) times daily as needed for moderate pain or headache.  . albuterol (PROVENTIL HFA;VENTOLIN HFA) 108 (90 Base) MCG/ACT inhaler Inhale 2 puffs into the lungs every 6 (six) hours as needed for wheezing or shortness of breath.   [name] apixaban (ELIQUIS) 2.5 MG TABS tablet Take 1 tablet (2.5 mg total) by mouth 2 (two) times daily.  . Biotin 10000 MCG TABS Take 1 tablet by mouth daily.   . calcium citrate (CALCITRATE - DOSED IN MG ELEMENTAL CALCIUM) 950 MG tablet Take 200 mg of elemental calcium by mouth 3 (three) times daily. 2 tabs with dialysis on M,W,F  . carvedilol (COREG) 12.5 MG tablet Take 12.5 mg by mouth 2 (two) times daily with a meal. Only takes once a day on dialysis days (holds in morning on Monday Wednesday and Friday)   . meclizine (ANTIVERT) 12.5 MG tablet Take 12.5 mg by mouth 3 (three) times daily as needed for dizziness.  . mometasone (ELOCON) 0.1 % cream Apply to affected area daily  . multivitamin (RENA-VIT) TABS tablet Take 1 tablet by mouth at bedtime.  . mupirocin ointment (BACTROBAN) 2 % Apply 1 application topically daily as needed (skin bumps).  . Nutritional Supplements (FEEDING SUPPLEMENT, NEPRO CARB  STEADY,) LIQD Take 237 mLs by mouth 3 (three) times a week. During dialysis, Monday, Wednesday & Friday  . nystatin (NYSTATIN) powder Apply topically 4 (four) times daily.  . ondansetron (ZOFRAN ODT) 4 MG disintegrating tablet Take 1 tablet (4 mg total) by mouth every 6 (six) hours as needed.  [name] oxyCODONE-acetaminophen (PERCOCET/ROXICET) 5-325 MG tablet Take 2 tablets by mouth every 4 (four) hours as needed for severe pain. (Patient taking differently: Take 1 tablet by mouth every 4 (four) hours as needed for severe pain. )  . sevelamer carbonate (RENVELA) 800 MG tablet Take 3,200 mg by mouth 3 (three) times daily with meals. Take 4 tablets with each meal  . temazepam (RESTORIL) 15 MG capsule Take 1 capsule (15 mg total) by mouth at bedtime as needed for sleep.   No facility-administered encounter medications on file as of 06/13/2020.     Goals Addressed              This Visit's Progress     Patient Stated   .  "I have been stumbling more frequently" (pt-stated)        Current Barriers:  [name] Knowledge Deficits related to evaluation and treatment of Gait disturbance and Impaired Physical Mobility  . Chronic Disease Management support and education needs related to ESRD, HTN, CHF  . ESRD on HD . Lacks needed caregiver assistance   Nurse Case Manager Clinical Goal(s):  [name] Over the next 30 days, patient will work with PCP to address needs related to evaluation  and treatment of patient reported episodes of "stumbling" and impaired gait disturbance  Goal Met  . New 05/08/20 Over the next 90 days, patient will work with the [place] CM and PCP on disease education and support to help improve [place] related to Impaired Mobility and Gait Disturbance with falls   CCM SW Interventions: Completed 06/13/20 . Completed call with patient; determined the patient has yet to be contacted by [facility] regarding rollator delivery . Successful outbound call placed to [facility]; spoke with  Pamala Hurry o Determined the patient has yet to receive delivery of rollator due to a backorder of supplies in the warehouse which ships equipment o Discussed the patient is eligible to pick up equipment from a retail store if desired o SW requested Pamala Hurry to contact the patient to review options to obtain equipment . Scheduled follow up call over the next week to confirm patient contact with [facility] and knowledge of barriers to equipment delivery . Collaboration with RN Care Manager to update on goal progression  Patient Self Care Activities:  . Self administers medications as prescribed . Attends all scheduled provider appointments . Calls pharmacy for medication refills . Calls provider office for new concerns or questions  Please see past updates related to this goal by clicking on the "Past Updates" button in the selected goal        Other   .  COMPLETED: Collaborate with RN Care Manager to perform appropriate assessments to assist with care coordination needs        CARE PLAN ENTRY (see longitudinal plan of care for additional care plan information)  Current Barriers:  . Limited social support . ADL IADL limitations . Lacks knowledge of community resource: SCAT  . Chronic conditions impacting ability to perform ADL and iADL needs including ESRD, HTN, and CHF . Frequent falls  Social Work Clinical Goal(s):  [name] Over the next 30 days the patient will work with SW to become more knowledgeable of transportation resources available to the patient Goal Closed due to patient being happy with current resources . Over the next 60 days the patient will work with SW to identify caregiver options to assist with ADL needs  CCM SW Interventions: Completed 06/13/20 . Completed call with patient . Determined the patient was denied PCS hours from [facility] following assessment completion o Patient reports she is not interested in an appeal at this time . Goal closure with option to  reopen as patient declines in functional ability  Patient Self Care Activities:  . Self administers medications as prescribed . Attends all scheduled provider appointments . Calls pharmacy for medication refills . Calls provider office for new concerns or questions . Limited ability to perform ADL's and iADL's  Please see past updates related to this goal by clicking on the "Past Updates" button in the selected goal          Follow Up Plan: SW will follow up with patient by phone over the next week to confirm patient contacted by [facility].   [name], BSW, CDP Social Worker, Certified Dementia Practitioner [place] [facility] [place] [facility] [phone]  Total time spent performing care coordination and/or care management activities with the patient by phone or face to face = 18 minutes.

## 2020-06-14 DIAGNOSIS — D509 Iron deficiency anemia, unspecified: Secondary | ICD-10-CM | POA: Diagnosis not present

## 2020-06-14 DIAGNOSIS — N186 End stage renal disease: Secondary | ICD-10-CM | POA: Diagnosis not present

## 2020-06-14 DIAGNOSIS — Z992 Dependence on renal dialysis: Secondary | ICD-10-CM | POA: Diagnosis not present

## 2020-06-14 DIAGNOSIS — D689 Coagulation defect, unspecified: Secondary | ICD-10-CM | POA: Diagnosis not present

## 2020-06-14 DIAGNOSIS — D631 Anemia in chronic kidney disease: Secondary | ICD-10-CM | POA: Diagnosis not present

## 2020-06-14 DIAGNOSIS — N2581 Secondary hyperparathyroidism of renal origin: Secondary | ICD-10-CM | POA: Diagnosis not present

## 2020-06-16 ENCOUNTER — Telehealth: Payer: Self-pay

## 2020-06-16 ENCOUNTER — Ambulatory Visit: Payer: Self-pay

## 2020-06-16 DIAGNOSIS — Z992 Dependence on renal dialysis: Secondary | ICD-10-CM | POA: Diagnosis not present

## 2020-06-16 DIAGNOSIS — R2689 Other abnormalities of gait and mobility: Secondary | ICD-10-CM

## 2020-06-16 DIAGNOSIS — N2581 Secondary hyperparathyroidism of renal origin: Secondary | ICD-10-CM | POA: Diagnosis not present

## 2020-06-16 DIAGNOSIS — D689 Coagulation defect, unspecified: Secondary | ICD-10-CM | POA: Diagnosis not present

## 2020-06-16 DIAGNOSIS — N186 End stage renal disease: Secondary | ICD-10-CM

## 2020-06-16 DIAGNOSIS — D631 Anemia in chronic kidney disease: Secondary | ICD-10-CM | POA: Diagnosis not present

## 2020-06-16 DIAGNOSIS — D509 Iron deficiency anemia, unspecified: Secondary | ICD-10-CM | POA: Diagnosis not present

## 2020-06-16 NOTE — Chronic Care Management (AMB) (Signed)
  Chronic Care Management   Outreach Note  06/16/2020 Name: Whitney Walker MRN: [id number] DOB: Aug 13, 1956  Referred by: [name], FNP Reason for referral : Care Coordination   SW placed an unsuccessful outbound call to the patient to review status of DME equipment needs. SW left a HIPAA compliant voice message requesting a return call.  Follow Up Plan: The care management team will reach out to the patient again over the next 14 days.   [name], BSW, CDP Social Worker, Certified Dementia Practitioner [place] [facility] [place] [facility] [phone]

## 2020-06-17 ENCOUNTER — Encounter: Payer: Self-pay | Admitting: Nurse Practitioner

## 2020-06-19 DIAGNOSIS — D631 Anemia in chronic kidney disease: Secondary | ICD-10-CM | POA: Diagnosis not present

## 2020-06-19 DIAGNOSIS — D509 Iron deficiency anemia, unspecified: Secondary | ICD-10-CM | POA: Diagnosis not present

## 2020-06-19 DIAGNOSIS — N186 End stage renal disease: Secondary | ICD-10-CM | POA: Diagnosis not present

## 2020-06-19 DIAGNOSIS — N2581 Secondary hyperparathyroidism of renal origin: Secondary | ICD-10-CM | POA: Diagnosis not present

## 2020-06-19 DIAGNOSIS — Z992 Dependence on renal dialysis: Secondary | ICD-10-CM | POA: Diagnosis not present

## 2020-06-19 DIAGNOSIS — D689 Coagulation defect, unspecified: Secondary | ICD-10-CM | POA: Diagnosis not present

## 2020-06-20 ENCOUNTER — Ambulatory Visit: Payer: Self-pay

## 2020-06-20 DIAGNOSIS — R2689 Other abnormalities of gait and mobility: Secondary | ICD-10-CM

## 2020-06-20 DIAGNOSIS — N186 End stage renal disease: Secondary | ICD-10-CM

## 2020-06-20 DIAGNOSIS — I5032 Chronic diastolic (congestive) heart failure: Secondary | ICD-10-CM

## 2020-06-20 NOTE — Chronic Care Management (AMB) (Signed)
Chronic Care Management    Social Work Follow Up Note  06/20/2020 Name: Whitney Walker MRN: [id number] DOB: 1956-09-25  Whitney Walker is a 64 y.o. year old female who is a primary care patient of Whitney Walker, [place]. The CCM team was consulted for assistance with care coordination.   Review of patient status, including review of consultants reports, other relevant assessments, and collaboration with appropriate care team members and the patient's provider was performed as part of comprehensive patient evaluation and provision of chronic care management services.    SDOH (Social Determinants of Health) assessments performed: No    Outpatient Encounter Medications as of 06/20/2020  Medication Sig  . acetaminophen (TYLENOL) 500 MG tablet Take 1,000 mg by mouth 2 (two) times daily as needed for moderate pain or headache.  . albuterol (PROVENTIL HFA;VENTOLIN HFA) 108 (90 Base) MCG/ACT inhaler Inhale 2 puffs into the lungs every 6 (six) hours as needed for wheezing or shortness of breath.   [name] apixaban (ELIQUIS) 2.5 MG TABS tablet Take 1 tablet (2.5 mg total) by mouth 2 (two) times daily.  . Biotin 10000 MCG TABS Take 1 tablet by mouth daily.   . calcium citrate (CALCITRATE - DOSED IN MG ELEMENTAL CALCIUM) 950 MG tablet Take 200 mg of elemental calcium by mouth 3 (three) times daily. 2 tabs with dialysis on M,W,F  . carvedilol (COREG) 12.5 MG tablet Take 12.5 mg by mouth 2 (two) times daily with a meal. Only takes once a day on dialysis days (holds in morning on Monday Wednesday and Friday)   . meclizine (ANTIVERT) 12.5 MG tablet Take 12.5 mg by mouth 3 (three) times daily as needed for dizziness.  . mometasone (ELOCON) 0.1 % cream Apply to affected area daily  . multivitamin (RENA-VIT) TABS tablet Take 1 tablet by mouth at bedtime.  . mupirocin ointment (BACTROBAN) 2 % Apply 1 application topically daily as needed (skin bumps).  . Nutritional Supplements (FEEDING SUPPLEMENT, NEPRO CARB  STEADY,) LIQD Take 237 mLs by mouth 3 (three) times a week. During dialysis, Monday, Wednesday & Friday  . nystatin (NYSTATIN) powder Apply topically 4 (four) times daily.  . ondansetron (ZOFRAN ODT) 4 MG disintegrating tablet Take 1 tablet (4 mg total) by mouth every 6 (six) hours as needed.  [name] oxyCODONE-acetaminophen (PERCOCET/ROXICET) 5-325 MG tablet Take 2 tablets by mouth every 4 (four) hours as needed for severe pain. (Patient taking differently: Take 1 tablet by mouth every 4 (four) hours as needed for severe pain. )  . sevelamer carbonate (RENVELA) 800 MG tablet Take 3,200 mg by mouth 3 (three) times daily with meals. Take 4 tablets with each meal  . temazepam (RESTORIL) 15 MG capsule Take 1 capsule (15 mg total) by mouth at bedtime as needed for sleep.   No facility-administered encounter medications on file as of 06/20/2020.     Goals Addressed              This Visit's Progress     Patient Stated   .  "I have been stumbling more frequently" (pt-stated)        Current Barriers:  [name] Knowledge Deficits related to evaluation and treatment of Gait disturbance and Impaired Physical Mobility  . Chronic Disease Management support and education needs related to ESRD, HTN, CHF  . ESRD on HD . Lacks needed caregiver assistance   Nurse Case Manager Clinical Goal(s):  [name] Over the next 30 days, patient will work with PCP to address needs related to evaluation  and treatment of patient reported episodes of "stumbling" and impaired gait disturbance  Goal Met  . New 05/08/20 Over the next 90 days, patient will work with the [place] CM and PCP on disease education and support to help improve [place] related to Impaired Mobility and Gait Disturbance with falls   CCM SW Interventions: Completed 06/20/20 . Completed call with patient; determined the patient has been contacted by [facility] and is aware of rollator back order . Determined the patient will await delivery once [facility] is able  to fulfill order . Assessed for acute care coordination needs- none identified . Encouraged the patient to contact SW as needed . Next RN Care Manager appointment planned for 7/1  CCM RN CM Interventions:  05/18/20 call completed with patient  . Evaluation of current treatment plan related to Impaired gait with recent fall and ED visit and patient's adherence to plan as established by provider . Determined patient received a PT evaluation while IP after being readmitted for Hyperkalemia . Discussed she will resume the HEP as directed by the IP PT and will not resume in home PT at this time . Determined patient has not fallen since last contact and was encouraged by her Hematologist to be more active due to continued risk for DVT . Determined patient would like to request an Rx for a rollator walker to assist with ability to be more mobile w/o falls . Sent in basket message to PCP provider Whitney Brine, FNP requesting an Rx for a rollator walker be faxed to [place], advised Whitney Walker will not start in home PT at this time but does plan to do her daily stretches as directed by IP PT . Discussed plans with patient for ongoing care management follow up and provided patient with direct contact information for care management team  Patient Self Care Activities:  . Self administers medications as prescribed . Attends all scheduled provider appointments . Calls pharmacy for medication refills . Calls provider office for new concerns or questions  Please see past updates related to this goal by clicking on the "Past Updates" button in the selected goal          Follow Up Plan: No SW follow up planned at this time. The patient will remain active with RN Care Manager.   [name], BSW, CDP Social Worker, Certified Dementia Practitioner [place] [facility] [place] [facility] [phone]  Total time spent performing care coordination and/or care management activities with the patient by  phone or face to face = 8 minutes.

## 2020-06-20 NOTE — Patient Instructions (Signed)
Social Worker Visit Information  Goals we discussed today:  Goals Addressed              This Visit's Progress     Patient Stated   .  "I have been stumbling more frequently" (pt-stated)        Current Barriers:  [name] Knowledge Deficits related to evaluation and treatment of Gait disturbance and Impaired Physical Mobility  . Chronic Disease Management support and education needs related to ESRD, HTN, CHF  . ESRD on HD . Lacks needed caregiver assistance   Nurse Case Manager Clinical Goal(s):  [name] Over the next 30 days, patient will work with PCP to address needs related to evaluation and treatment of patient reported episodes of "stumbling" and impaired gait disturbance  Goal Met  . New 05/08/20 Over the next 90 days, patient will work with the [place] CM and PCP on disease education and support to help improve [place] related to Impaired Mobility and Gait Disturbance with falls   CCM SW Interventions: Completed 06/20/20 . Completed call with patient; determined the patient has been contacted by [facility] and is aware of rollator back order . Determined the patient will await delivery once [facility] is able to fulfill order . Assessed for acute care coordination needs- none identified . Encouraged the patient to contact SW as needed . Next RN Care Manager appointment planned for 7/1  CCM RN CM Interventions:  05/18/20 call completed with patient  . Evaluation of current treatment plan related to Impaired gait with recent fall and ED visit and patient's adherence to plan as established by provider . Determined patient received a PT evaluation while IP after being readmitted for Hyperkalemia . Discussed she will resume the HEP as directed by the IP PT and will not resume in home PT at this time . Determined patient has not fallen since last contact and was encouraged by her Hematologist to be more active due to continued risk for DVT . Determined patient would like to request an  Rx for a rollator walker to assist with ability to be more mobile w/o falls . Sent in basket message to PCP provider [name], FNP requesting an Rx for a rollator walker be faxed to [facility], advised Janece Ms. Cavness will not start in home PT at this time but does plan to do her daily stretches as directed by IP PT . Discussed plans with patient for ongoing care management follow up and provided patient with direct contact information for care management team  Patient Self Care Activities:  . Self administers medications as prescribed . Attends all scheduled provider appointments . Calls pharmacy for medication refills . Calls provider office for new concerns or questions  Please see past updates related to this goal by clicking on the "Past Updates" button in the selected goal          Follow Up Plan: No SW follow up planned. Please call me as needed.   [name], BSW, CDP Social Worker, Certified Dementia Practitioner [place] [facility] [place] [facility] [phone]

## 2020-06-21 ENCOUNTER — Ambulatory Visit: Payer: Medicare Other

## 2020-06-21 ENCOUNTER — Ambulatory Visit: Payer: Medicare Other | Admitting: Nurse Practitioner

## 2020-06-21 DIAGNOSIS — D689 Coagulation defect, unspecified: Secondary | ICD-10-CM | POA: Diagnosis not present

## 2020-06-21 DIAGNOSIS — N186 End stage renal disease: Secondary | ICD-10-CM | POA: Diagnosis not present

## 2020-06-21 DIAGNOSIS — N2581 Secondary hyperparathyroidism of renal origin: Secondary | ICD-10-CM | POA: Diagnosis not present

## 2020-06-21 DIAGNOSIS — D631 Anemia in chronic kidney disease: Secondary | ICD-10-CM | POA: Diagnosis not present

## 2020-06-21 DIAGNOSIS — D509 Iron deficiency anemia, unspecified: Secondary | ICD-10-CM | POA: Diagnosis not present

## 2020-06-21 DIAGNOSIS — Z992 Dependence on renal dialysis: Secondary | ICD-10-CM | POA: Diagnosis not present

## 2020-06-23 ENCOUNTER — Telehealth: Payer: Self-pay

## 2020-06-23 DIAGNOSIS — D689 Coagulation defect, unspecified: Secondary | ICD-10-CM | POA: Diagnosis not present

## 2020-06-23 DIAGNOSIS — N186 End stage renal disease: Secondary | ICD-10-CM | POA: Diagnosis not present

## 2020-06-23 DIAGNOSIS — D509 Iron deficiency anemia, unspecified: Secondary | ICD-10-CM | POA: Diagnosis not present

## 2020-06-23 DIAGNOSIS — R262 Difficulty in walking, not elsewhere classified: Secondary | ICD-10-CM | POA: Diagnosis not present

## 2020-06-23 DIAGNOSIS — N2581 Secondary hyperparathyroidism of renal origin: Secondary | ICD-10-CM | POA: Diagnosis not present

## 2020-06-23 DIAGNOSIS — D631 Anemia in chronic kidney disease: Secondary | ICD-10-CM | POA: Diagnosis not present

## 2020-06-23 DIAGNOSIS — Z992 Dependence on renal dialysis: Secondary | ICD-10-CM | POA: Diagnosis not present

## 2020-06-26 ENCOUNTER — Ambulatory Visit: Payer: Self-pay

## 2020-06-26 DIAGNOSIS — D509 Iron deficiency anemia, unspecified: Secondary | ICD-10-CM | POA: Diagnosis not present

## 2020-06-26 DIAGNOSIS — Z992 Dependence on renal dialysis: Secondary | ICD-10-CM | POA: Diagnosis not present

## 2020-06-26 DIAGNOSIS — D631 Anemia in chronic kidney disease: Secondary | ICD-10-CM | POA: Diagnosis not present

## 2020-06-26 DIAGNOSIS — N186 End stage renal disease: Secondary | ICD-10-CM

## 2020-06-26 DIAGNOSIS — N2581 Secondary hyperparathyroidism of renal origin: Secondary | ICD-10-CM | POA: Diagnosis not present

## 2020-06-26 DIAGNOSIS — D689 Coagulation defect, unspecified: Secondary | ICD-10-CM | POA: Diagnosis not present

## 2020-06-26 DIAGNOSIS — R2689 Other abnormalities of gait and mobility: Secondary | ICD-10-CM

## 2020-06-26 NOTE — Chronic Care Management (AMB) (Signed)
Chronic Care Management    Social Work Follow Up Note  06/26/2020 Name: Whitney Walker MRN: [id number] DOB: 08/28/1956  Whitney Walker is a 64 y.o. year old female who is a primary care patient of Whitney Walker, [place]. The CCM team was consulted for assistance with care coordination.   Review of patient status, including review of consultants reports, other relevant assessments, and collaboration with appropriate care team members and the patient's provider was performed as part of comprehensive patient evaluation and provision of chronic care management services.    SDOH (Social Determinants of Health) assessments performed: No    Outpatient Encounter Medications as of 06/26/2020  Medication Sig  . acetaminophen (TYLENOL) 500 MG tablet Take 1,000 mg by mouth 2 (two) times daily as needed for moderate pain or headache.  . albuterol (PROVENTIL HFA;VENTOLIN HFA) 108 (90 Base) MCG/ACT inhaler Inhale 2 puffs into the lungs every 6 (six) hours as needed for wheezing or shortness of breath.   [name] apixaban (ELIQUIS) 2.5 MG TABS tablet Take 1 tablet (2.5 mg total) by mouth 2 (two) times daily.  . Biotin 10000 MCG TABS Take 1 tablet by mouth daily.   . calcium citrate (CALCITRATE - DOSED IN MG ELEMENTAL CALCIUM) 950 MG tablet Take 200 mg of elemental calcium by mouth 3 (three) times daily. 2 tabs with dialysis on M,W,F  . carvedilol (COREG) 12.5 MG tablet Take 12.5 mg by mouth 2 (two) times daily with a meal. Only takes once a day on dialysis days (holds in morning on Monday Wednesday and Friday)   . meclizine (ANTIVERT) 12.5 MG tablet Take 12.5 mg by mouth 3 (three) times daily as needed for dizziness.  . mometasone (ELOCON) 0.1 % cream Apply to affected area daily  . multivitamin (RENA-VIT) TABS tablet Take 1 tablet by mouth at bedtime.  . mupirocin ointment (BACTROBAN) 2 % Apply 1 application topically daily as needed (skin bumps).  . Nutritional Supplements (FEEDING SUPPLEMENT, NEPRO CARB  STEADY,) LIQD Take 237 mLs by mouth 3 (three) times a week. During dialysis, Monday, Wednesday & Friday  . nystatin (NYSTATIN) powder Apply topically 4 (four) times daily.  . ondansetron (ZOFRAN ODT) 4 MG disintegrating tablet Take 1 tablet (4 mg total) by mouth every 6 (six) hours as needed.  [name] oxyCODONE-acetaminophen (PERCOCET/ROXICET) 5-325 MG tablet Take 2 tablets by mouth every 4 (four) hours as needed for severe pain. (Patient taking differently: Take 1 tablet by mouth every 4 (four) hours as needed for severe pain. )  . sevelamer carbonate (RENVELA) 800 MG tablet Take 3,200 mg by mouth 3 (three) times daily with meals. Take 4 tablets with each meal  . temazepam (RESTORIL) 15 MG capsule Take 1 capsule (15 mg total) by mouth at bedtime as needed for sleep.   No facility-administered encounter medications on file as of 06/26/2020.     Goals Addressed              This Visit's Progress     Patient Stated   .  "I have been stumbling more frequently" (pt-stated)   On track     Current Barriers:  [name] Knowledge Deficits related to evaluation and treatment of Gait disturbance and Impaired Physical Mobility  . Chronic Disease Management support and education needs related to ESRD, HTN, CHF  . ESRD on HD . Lacks needed caregiver assistance   Nurse Case Manager Clinical Goal(s):  [name] Over the next 30 days, patient will work with PCP to address needs related to  evaluation and treatment of patient reported episodes of "stumbling" and impaired gait disturbance  Goal Met  . New 05/08/20 Over the next 90 days, patient will work with the [place] CM and PCP on disease education and support to help improve [place] related to Impaired Mobility and Gait Disturbance with falls   CCM SW Interventions: Completed 06/26/20 . SW received inbound call from the patient whom reports rollator delivered over the weekend o Sister and brother in law plan to assist with assembly . Encouraged the patient to  contact SW with future care coordination needs . Next RN outreach call planned for 06/29/20  CCM RN CM Interventions:  05/18/20 call completed with patient  . Evaluation of current treatment plan related to Impaired gait with recent fall and ED visit and patient's adherence to plan as established by provider . Determined patient received a PT evaluation while IP after being readmitted for Hyperkalemia . Discussed she will resume the HEP as directed by the IP PT and will not resume in home PT at this time . Determined patient has not fallen since last contact and was encouraged by her Hematologist to be more active due to continued risk for DVT . Determined patient would like to request an Rx for a rollator walker to assist with ability to be more mobile w/o falls . Sent in basket message to PCP provider Whitney Brine, FNP requesting an Rx for a rollator walker be faxed to [place], advised [name] will not start in home PT at this time but does plan to do her daily stretches as directed by IP PT . Discussed plans with patient for ongoing care management follow up and provided patient with direct contact information for care management team  Patient Self Care Activities:  . Self administers medications as prescribed . Attends all scheduled provider appointments . Calls pharmacy for medication refills . Calls provider office for new concerns or questions  Please see past updates related to this goal by clicking on the "Past Updates" button in the selected goal          Follow Up Plan: No SW follow up planned at this time. RN Care Manager outreach scheduled for 06/29/20.   [name], BSW, CDP Social Worker, Certified Dementia Practitioner [place] [facility] [place] [facility] [phone]  Total time spent performing care coordination and/or care management activities with the patient by phone or face to face = 5 minutes.

## 2020-06-26 NOTE — Patient Instructions (Signed)
Social Worker Visit Information  Goals we discussed today:  Goals Addressed              This Visit's Progress     Patient Stated   .  "I have been stumbling more frequently" (pt-stated)   On track     Current Barriers:  [name] Knowledge Deficits related to evaluation and treatment of Gait disturbance and Impaired Physical Mobility  . Chronic Disease Management support and education needs related to ESRD, HTN, CHF  . ESRD on HD . Lacks needed caregiver assistance   Nurse Case Manager Clinical Goal(s):  [name] Over the next 30 days, patient will work with PCP to address needs related to evaluation and treatment of patient reported episodes of "stumbling" and impaired gait disturbance  Goal Met  . New 05/08/20 Over the next 90 days, patient will work with the [place] CM and PCP on disease education and support to help improve [place] related to Impaired Mobility and Gait Disturbance with falls   CCM SW Interventions: Completed 06/26/20 . SW received inbound call from the patient whom reports rollator delivered over the weekend o Sister and brother in law plan to assist with assembly . Encouraged the patient to contact SW with future care coordination needs . Next RN outreach call planned for 06/29/20  CCM RN CM Interventions:  05/18/20 call completed with patient  . Evaluation of current treatment plan related to Impaired gait with recent fall and ED visit and patient's adherence to plan as established by provider . Determined patient received a PT evaluation while IP after being readmitted for Hyperkalemia . Discussed she will resume the HEP as directed by the IP PT and will not resume in home PT at this time . Determined patient has not fallen since last contact and was encouraged by her Hematologist to be more active due to continued risk for DVT . Determined patient would like to request an Rx for a rollator walker to assist with ability to be more mobile w/o falls . Sent in basket  message to PCP provider Whitney Brine, FNP requesting an Rx for a rollator walker be faxed to [place], advised Whitney Walker will not start in home PT at this time but does plan to do her daily stretches as directed by IP PT . Discussed plans with patient for ongoing care management follow up and provided patient with direct contact information for care management team  Patient Self Care Activities:  . Self administers medications as prescribed . Attends all scheduled provider appointments . Calls pharmacy for medication refills . Calls provider office for new concerns or questions  Please see past updates related to this goal by clicking on the "Past Updates" button in the selected goal          Follow Up Plan: No SW follow up planned at this time. Please contact me with future resource needs.   [name], BSW, CDP Social Worker, Certified Dementia Practitioner [place] [facility] [place] [facility] [phone]

## 2020-06-27 DIAGNOSIS — N186 End stage renal disease: Secondary | ICD-10-CM | POA: Diagnosis not present

## 2020-06-28 DIAGNOSIS — N186 End stage renal disease: Secondary | ICD-10-CM | POA: Diagnosis not present

## 2020-06-28 DIAGNOSIS — Z992 Dependence on renal dialysis: Secondary | ICD-10-CM | POA: Diagnosis not present

## 2020-06-28 DIAGNOSIS — D509 Iron deficiency anemia, unspecified: Secondary | ICD-10-CM | POA: Diagnosis not present

## 2020-06-28 DIAGNOSIS — I129 Hypertensive chronic kidney disease with stage 1 through stage 4 chronic kidney disease, or unspecified chronic kidney disease: Secondary | ICD-10-CM | POA: Diagnosis not present

## 2020-06-28 DIAGNOSIS — D689 Coagulation defect, unspecified: Secondary | ICD-10-CM | POA: Diagnosis not present

## 2020-06-28 DIAGNOSIS — N2581 Secondary hyperparathyroidism of renal origin: Secondary | ICD-10-CM | POA: Diagnosis not present

## 2020-06-28 DIAGNOSIS — D631 Anemia in chronic kidney disease: Secondary | ICD-10-CM | POA: Diagnosis not present

## 2020-06-28 NOTE — Patient Instructions (Signed)
Visit Information  Goals Addressed            This Visit's Progress   . Pharmacy Care Plan       CARE PLAN ENTRY (see longitudinal plan of care for additional care plan information)  Current Barriers:  . Chronic Disease Management support, education, and care coordination needs related to Hypertension, Hyperlipidemia, and Chronic Kidney Disease   Hypertension BP Readings from Last 3 Encounters:  05/18/20 (!) 149/81  05/16/20 (!) 174/83  05/10/20 (!) 171/86   . Pharmacist Clinical Goal(s): o Over the next 180 days, patient will work with PharmD and providers to achieve BP goal <130/80 . Current regimen:  o Carvedilol 12.5mg  twice daily (once daily on dialysis days) . Interventions: o Provided dietary and exercise recommendations o Consider additional blood pressure lowering medication if blood pressure remains elevated. Would need to consult dialysis before adding additional medication. . Patient self care activities - Over the next 180 days, patient will: o Check BP 2-3 times daily, document, and provide at future appointments o Ensure daily salt intake < 2300 mg/day o Try to exercise for 30 minutes 5 times weekly  Hyperlipidemia Lab Results  Component Value Date/Time   LDLCALC 97 06/04/2018 12:00 AM   . Pharmacist Clinical Goal(s): o Over the next 120 days, patient will work with PharmD and providers to achieve LDL goal < 70 . Current regimen:  o N/A . Interventions: o Determine why atorvastatin was discontinued o Discuss starting atorvastatin or another cholesterol medication with PCP and patient at follow up . Patient self care activities - Over the next 120 days, patient will: o Focus on eating healthy and exercising  Chronic Kidney Disease on Dialysis . Pharmacist Clinical Goal(s) o Over the next 90 days, patient will work with PharmD and providers to minimize complications as a result of dialysis . Current regimen:  o Nephro Carb steady three times weekly  during dialysis o Renvela 800mg  4 tablets 3 times daily with each meal . Interventions: o Recommend patient follow up with neurologist regarding complaint of losing memory from dialysis o Collaboration with PCP regarding patient's request to obtain bandages. Determined we are unable to obtain requested bandages, patient will have to get from dialysis center . Patient self care activities - Over the next 90 days, patient will: o Consider neurologist follow up o Obtain bandages from current dialysis center  Medication management . Pharmacist Clinical Goal(s): o Over the next 90 days, patient will work with PharmD and providers to achieve optimal medication adherence . Current pharmacy: [facility] . Interventions o Comprehensive medication review performed. o Utilize [facility] for medication synchronization, packaging and delivery . Patient self care activities - Over the next 90 days, patient will: o Focus on medication adherence by using adherence packaging and medication delivery services o Take medications as prescribed o Report any questions or concerns to PharmD and/or provider(s)  Initial goal documentation        Ms. Coury was given information about Chronic Care Management services today including:  1. CCM service includes personalized support from designated clinical staff supervised by her physician, including individualized plan of care and coordination with other care providers 2. 24/7 contact phone numbers for assistance for urgent and routine care needs. 3. Standard insurance, coinsurance, copays and deductibles apply for chronic care management only during months in which we provide at least 20 minutes of these services. Most insurances cover these services at 100%, however patients may be responsible for any copay,  coinsurance and/or deductible if applicable. This service may help you avoid the need for more expensive face-to-face services. 4. Only one  practitioner may furnish and bill the service in a calendar month. 5. The patient may stop CCM services at any time (effective at the end of the month) by phone call to the office staff.  Patient agreed to services and verbal consent obtained.   Patient verbalizes understanding of instructions provided today.  Telephone follow up appointment with pharmacy team member scheduled for: 07/12/20 @ [place], PharmD Clinical Pharmacist [facility] [phone]

## 2020-06-29 ENCOUNTER — Telehealth: Payer: Self-pay

## 2020-06-30 DIAGNOSIS — T80219D Unspecified infection due to central venous catheter, subsequent encounter: Secondary | ICD-10-CM | POA: Diagnosis not present

## 2020-06-30 DIAGNOSIS — A498 Other bacterial infections of unspecified site: Secondary | ICD-10-CM | POA: Diagnosis not present

## 2020-06-30 DIAGNOSIS — N186 End stage renal disease: Secondary | ICD-10-CM | POA: Diagnosis not present

## 2020-06-30 DIAGNOSIS — N2581 Secondary hyperparathyroidism of renal origin: Secondary | ICD-10-CM | POA: Diagnosis not present

## 2020-06-30 DIAGNOSIS — D689 Coagulation defect, unspecified: Secondary | ICD-10-CM | POA: Diagnosis not present

## 2020-06-30 DIAGNOSIS — D631 Anemia in chronic kidney disease: Secondary | ICD-10-CM | POA: Diagnosis not present

## 2020-06-30 DIAGNOSIS — Z992 Dependence on renal dialysis: Secondary | ICD-10-CM | POA: Diagnosis not present

## 2020-07-03 DIAGNOSIS — T80219D Unspecified infection due to central venous catheter, subsequent encounter: Secondary | ICD-10-CM | POA: Diagnosis not present

## 2020-07-03 DIAGNOSIS — N2581 Secondary hyperparathyroidism of renal origin: Secondary | ICD-10-CM | POA: Diagnosis not present

## 2020-07-03 DIAGNOSIS — D689 Coagulation defect, unspecified: Secondary | ICD-10-CM | POA: Diagnosis not present

## 2020-07-03 DIAGNOSIS — N186 End stage renal disease: Secondary | ICD-10-CM | POA: Diagnosis not present

## 2020-07-03 DIAGNOSIS — Z992 Dependence on renal dialysis: Secondary | ICD-10-CM | POA: Diagnosis not present

## 2020-07-03 DIAGNOSIS — A498 Other bacterial infections of unspecified site: Secondary | ICD-10-CM | POA: Diagnosis not present

## 2020-07-03 DIAGNOSIS — D631 Anemia in chronic kidney disease: Secondary | ICD-10-CM | POA: Diagnosis not present

## 2020-07-05 DIAGNOSIS — N2581 Secondary hyperparathyroidism of renal origin: Secondary | ICD-10-CM | POA: Diagnosis not present

## 2020-07-05 DIAGNOSIS — D689 Coagulation defect, unspecified: Secondary | ICD-10-CM | POA: Diagnosis not present

## 2020-07-05 DIAGNOSIS — T80219D Unspecified infection due to central venous catheter, subsequent encounter: Secondary | ICD-10-CM | POA: Diagnosis not present

## 2020-07-05 DIAGNOSIS — D631 Anemia in chronic kidney disease: Secondary | ICD-10-CM | POA: Diagnosis not present

## 2020-07-05 DIAGNOSIS — A498 Other bacterial infections of unspecified site: Secondary | ICD-10-CM | POA: Diagnosis not present

## 2020-07-05 DIAGNOSIS — N186 End stage renal disease: Secondary | ICD-10-CM | POA: Diagnosis not present

## 2020-07-05 DIAGNOSIS — Z992 Dependence on renal dialysis: Secondary | ICD-10-CM | POA: Diagnosis not present

## 2020-07-07 DIAGNOSIS — N2581 Secondary hyperparathyroidism of renal origin: Secondary | ICD-10-CM | POA: Diagnosis not present

## 2020-07-07 DIAGNOSIS — A498 Other bacterial infections of unspecified site: Secondary | ICD-10-CM | POA: Diagnosis not present

## 2020-07-07 DIAGNOSIS — D689 Coagulation defect, unspecified: Secondary | ICD-10-CM | POA: Diagnosis not present

## 2020-07-07 DIAGNOSIS — D631 Anemia in chronic kidney disease: Secondary | ICD-10-CM | POA: Diagnosis not present

## 2020-07-07 DIAGNOSIS — Z992 Dependence on renal dialysis: Secondary | ICD-10-CM | POA: Diagnosis not present

## 2020-07-07 DIAGNOSIS — T80219D Unspecified infection due to central venous catheter, subsequent encounter: Secondary | ICD-10-CM | POA: Diagnosis not present

## 2020-07-07 DIAGNOSIS — N186 End stage renal disease: Secondary | ICD-10-CM | POA: Diagnosis not present

## 2020-07-10 DIAGNOSIS — D631 Anemia in chronic kidney disease: Secondary | ICD-10-CM | POA: Diagnosis not present

## 2020-07-10 DIAGNOSIS — T80219D Unspecified infection due to central venous catheter, subsequent encounter: Secondary | ICD-10-CM | POA: Diagnosis not present

## 2020-07-10 DIAGNOSIS — N2581 Secondary hyperparathyroidism of renal origin: Secondary | ICD-10-CM | POA: Diagnosis not present

## 2020-07-10 DIAGNOSIS — D689 Coagulation defect, unspecified: Secondary | ICD-10-CM | POA: Diagnosis not present

## 2020-07-10 DIAGNOSIS — N186 End stage renal disease: Secondary | ICD-10-CM | POA: Diagnosis not present

## 2020-07-10 DIAGNOSIS — A498 Other bacterial infections of unspecified site: Secondary | ICD-10-CM | POA: Diagnosis not present

## 2020-07-10 DIAGNOSIS — Z992 Dependence on renal dialysis: Secondary | ICD-10-CM | POA: Diagnosis not present

## 2020-07-11 ENCOUNTER — Telehealth: Payer: Self-pay | Admitting: Nurse Practitioner

## 2020-07-11 NOTE — Chronic Care Management (AMB) (Signed)
  Chronic Care Management   Note  07/11/2020 Name: Whitney Walker MRN: [id number] DOB: July 14, 1956  Whitney Walker is a 64 y.o. year old female who is a primary care patient of [name], [place] and is actively engaged with the care management team. I reached out to Kyra Leyland by phone today to assist with re-scheduling a follow up visit with the Pharmacist.  Follow up plan: Telephone appointment with care management team member scheduled for:08/08/2020  [place], [place] [facility]  [place], [place] [address] Direct Dial: [email or web address] Website: [email or web address]

## 2020-07-12 ENCOUNTER — Telehealth: Payer: Self-pay

## 2020-07-12 DIAGNOSIS — A498 Other bacterial infections of unspecified site: Secondary | ICD-10-CM | POA: Diagnosis not present

## 2020-07-12 DIAGNOSIS — Z992 Dependence on renal dialysis: Secondary | ICD-10-CM | POA: Diagnosis not present

## 2020-07-12 DIAGNOSIS — D689 Coagulation defect, unspecified: Secondary | ICD-10-CM | POA: Diagnosis not present

## 2020-07-12 DIAGNOSIS — N186 End stage renal disease: Secondary | ICD-10-CM | POA: Diagnosis not present

## 2020-07-12 DIAGNOSIS — T80219D Unspecified infection due to central venous catheter, subsequent encounter: Secondary | ICD-10-CM | POA: Diagnosis not present

## 2020-07-12 DIAGNOSIS — N2581 Secondary hyperparathyroidism of renal origin: Secondary | ICD-10-CM | POA: Diagnosis not present

## 2020-07-12 DIAGNOSIS — D631 Anemia in chronic kidney disease: Secondary | ICD-10-CM | POA: Diagnosis not present

## 2020-07-14 DIAGNOSIS — D689 Coagulation defect, unspecified: Secondary | ICD-10-CM | POA: Diagnosis not present

## 2020-07-14 DIAGNOSIS — Z992 Dependence on renal dialysis: Secondary | ICD-10-CM | POA: Diagnosis not present

## 2020-07-14 DIAGNOSIS — T80219D Unspecified infection due to central venous catheter, subsequent encounter: Secondary | ICD-10-CM | POA: Diagnosis not present

## 2020-07-14 DIAGNOSIS — N186 End stage renal disease: Secondary | ICD-10-CM | POA: Diagnosis not present

## 2020-07-14 DIAGNOSIS — D631 Anemia in chronic kidney disease: Secondary | ICD-10-CM | POA: Diagnosis not present

## 2020-07-14 DIAGNOSIS — A498 Other bacterial infections of unspecified site: Secondary | ICD-10-CM | POA: Diagnosis not present

## 2020-07-14 DIAGNOSIS — N2581 Secondary hyperparathyroidism of renal origin: Secondary | ICD-10-CM | POA: Diagnosis not present

## 2020-07-14 DIAGNOSIS — T80219A Unspecified infection due to central venous catheter, initial encounter: Secondary | ICD-10-CM | POA: Insufficient documentation

## 2020-07-15 DIAGNOSIS — D631 Anemia in chronic kidney disease: Secondary | ICD-10-CM | POA: Diagnosis not present

## 2020-07-15 DIAGNOSIS — D689 Coagulation defect, unspecified: Secondary | ICD-10-CM | POA: Diagnosis not present

## 2020-07-15 DIAGNOSIS — N2581 Secondary hyperparathyroidism of renal origin: Secondary | ICD-10-CM | POA: Diagnosis not present

## 2020-07-15 DIAGNOSIS — N186 End stage renal disease: Secondary | ICD-10-CM | POA: Diagnosis not present

## 2020-07-15 DIAGNOSIS — A498 Other bacterial infections of unspecified site: Secondary | ICD-10-CM | POA: Diagnosis not present

## 2020-07-15 DIAGNOSIS — T80219D Unspecified infection due to central venous catheter, subsequent encounter: Secondary | ICD-10-CM | POA: Diagnosis not present

## 2020-07-15 DIAGNOSIS — Z992 Dependence on renal dialysis: Secondary | ICD-10-CM | POA: Diagnosis not present

## 2020-07-19 DIAGNOSIS — Z992 Dependence on renal dialysis: Secondary | ICD-10-CM | POA: Diagnosis not present

## 2020-07-19 DIAGNOSIS — A498 Other bacterial infections of unspecified site: Secondary | ICD-10-CM | POA: Diagnosis not present

## 2020-07-19 DIAGNOSIS — D631 Anemia in chronic kidney disease: Secondary | ICD-10-CM | POA: Diagnosis not present

## 2020-07-19 DIAGNOSIS — N186 End stage renal disease: Secondary | ICD-10-CM | POA: Diagnosis not present

## 2020-07-19 DIAGNOSIS — T80219D Unspecified infection due to central venous catheter, subsequent encounter: Secondary | ICD-10-CM | POA: Diagnosis not present

## 2020-07-19 DIAGNOSIS — D689 Coagulation defect, unspecified: Secondary | ICD-10-CM | POA: Diagnosis not present

## 2020-07-19 DIAGNOSIS — N2581 Secondary hyperparathyroidism of renal origin: Secondary | ICD-10-CM | POA: Diagnosis not present

## 2020-07-20 ENCOUNTER — Other Ambulatory Visit (HOSPITAL_COMMUNITY): Payer: Self-pay | Admitting: Nephrology

## 2020-07-20 DIAGNOSIS — Z992 Dependence on renal dialysis: Secondary | ICD-10-CM

## 2020-07-20 DIAGNOSIS — N186 End stage renal disease: Secondary | ICD-10-CM

## 2020-07-21 ENCOUNTER — Other Ambulatory Visit: Payer: Self-pay

## 2020-07-21 ENCOUNTER — Other Ambulatory Visit: Payer: Self-pay | Admitting: Radiology

## 2020-07-21 ENCOUNTER — Ambulatory Visit (HOSPITAL_COMMUNITY)
Admission: RE | Admit: 2020-07-21 | Discharge: 2020-07-21 | Disposition: A | Discharge status: Home / Self Care | Payer: Medicare Other | Source: Ambulatory Visit | Attending: Nephrology | Admitting: Nephrology

## 2020-07-21 DIAGNOSIS — N2581 Secondary hyperparathyroidism of renal origin: Secondary | ICD-10-CM | POA: Diagnosis not present

## 2020-07-21 DIAGNOSIS — N186 End stage renal disease: Secondary | ICD-10-CM | POA: Diagnosis not present

## 2020-07-21 DIAGNOSIS — T80219D Unspecified infection due to central venous catheter, subsequent encounter: Secondary | ICD-10-CM | POA: Diagnosis not present

## 2020-07-21 DIAGNOSIS — Z4901 Encounter for fitting and adjustment of extracorporeal dialysis catheter: Secondary | ICD-10-CM | POA: Diagnosis not present

## 2020-07-21 DIAGNOSIS — T80211A Bloodstream infection due to central venous catheter, initial encounter: Secondary | ICD-10-CM | POA: Diagnosis not present

## 2020-07-21 DIAGNOSIS — Z992 Dependence on renal dialysis: Secondary | ICD-10-CM | POA: Diagnosis not present

## 2020-07-21 DIAGNOSIS — A498 Other bacterial infections of unspecified site: Secondary | ICD-10-CM | POA: Diagnosis not present

## 2020-07-21 DIAGNOSIS — D689 Coagulation defect, unspecified: Secondary | ICD-10-CM | POA: Diagnosis not present

## 2020-07-21 DIAGNOSIS — D631 Anemia in chronic kidney disease: Secondary | ICD-10-CM | POA: Diagnosis not present

## 2020-07-21 HISTORY — PX: IR REMOVAL TUN CV CATH W/O FL: IMG2289

## 2020-07-21 MED ORDER — LIDOCAINE HCL 1 % IJ SOLN
INTRAMUSCULAR | Status: AC
  Filled 2020-07-21: qty 20

## 2020-07-21 MED ORDER — CHLORHEXIDINE GLUCONATE 4 % EX LIQD
CUTANEOUS | Status: AC
  Filled 2020-07-21: qty 15

## 2020-07-21 MED ORDER — LIDOCAINE HCL 1 % IJ SOLN
INTRAMUSCULAR | Status: DC | PRN
  Administered 2020-07-21: 5 mL

## 2020-07-21 MED ORDER — CHLORHEXIDINE GLUCONATE 4 % EX LIQD
CUTANEOUS | Status: DC | PRN
  Administered 2020-07-21: 1 via TOPICAL

## 2020-07-21 NOTE — Procedures (Signed)
Successful removal of right IJ tunneled HD catheter.   After obtaining consent and performing a time-out, the right upper chest was prepped and draped in the normal sterile fashion. The heparin was removed from both ports. 1% lidocaine was used for local anesthesia. Using gentle blunt dissection the cuff of the catheter was exposed and the catheter was removed in its entirety. Pressure was held until hemostasis was obtained. A sterile dressing was applied. The patient tolerated the procedure well with no immediate complications.    Soyla Dryer, [place] [phone] 07/21/2020, 11:56 AM

## 2020-07-24 ENCOUNTER — Ambulatory Visit (HOSPITAL_COMMUNITY)
Admission: RE | Admit: 2020-07-24 | Discharge: 2020-07-24 | Disposition: A | Discharge status: Home / Self Care | Payer: Medicare Other | Source: Ambulatory Visit | Attending: Nephrology | Admitting: Nephrology

## 2020-07-24 ENCOUNTER — Encounter (HOSPITAL_COMMUNITY): Payer: Self-pay

## 2020-07-24 ENCOUNTER — Other Ambulatory Visit (HOSPITAL_COMMUNITY): Payer: Self-pay | Admitting: Nephrology

## 2020-07-24 ENCOUNTER — Other Ambulatory Visit: Payer: Self-pay

## 2020-07-24 DIAGNOSIS — G4730 Sleep apnea, unspecified: Secondary | ICD-10-CM | POA: Diagnosis not present

## 2020-07-24 DIAGNOSIS — R7881 Bacteremia: Secondary | ICD-10-CM | POA: Diagnosis not present

## 2020-07-24 DIAGNOSIS — Z992 Dependence on renal dialysis: Secondary | ICD-10-CM | POA: Diagnosis not present

## 2020-07-24 DIAGNOSIS — G2581 Restless legs syndrome: Secondary | ICD-10-CM | POA: Insufficient documentation

## 2020-07-24 DIAGNOSIS — I739 Peripheral vascular disease, unspecified: Secondary | ICD-10-CM | POA: Diagnosis not present

## 2020-07-24 DIAGNOSIS — Z86718 Personal history of other venous thrombosis and embolism: Secondary | ICD-10-CM | POA: Diagnosis not present

## 2020-07-24 DIAGNOSIS — D649 Anemia, unspecified: Secondary | ICD-10-CM | POA: Insufficient documentation

## 2020-07-24 DIAGNOSIS — F419 Anxiety disorder, unspecified: Secondary | ICD-10-CM | POA: Insufficient documentation

## 2020-07-24 DIAGNOSIS — N186 End stage renal disease: Secondary | ICD-10-CM | POA: Diagnosis not present

## 2020-07-24 DIAGNOSIS — J449 Chronic obstructive pulmonary disease, unspecified: Secondary | ICD-10-CM | POA: Insufficient documentation

## 2020-07-24 DIAGNOSIS — N19 Unspecified kidney failure: Secondary | ICD-10-CM | POA: Diagnosis not present

## 2020-07-24 DIAGNOSIS — Z7901 Long term (current) use of anticoagulants: Secondary | ICD-10-CM | POA: Insufficient documentation

## 2020-07-24 DIAGNOSIS — Z79899 Other long term (current) drug therapy: Secondary | ICD-10-CM | POA: Insufficient documentation

## 2020-07-24 DIAGNOSIS — I509 Heart failure, unspecified: Secondary | ICD-10-CM | POA: Insufficient documentation

## 2020-07-24 DIAGNOSIS — I2510 Atherosclerotic heart disease of native coronary artery without angina pectoris: Secondary | ICD-10-CM | POA: Diagnosis not present

## 2020-07-24 DIAGNOSIS — F329 Major depressive disorder, single episode, unspecified: Secondary | ICD-10-CM | POA: Diagnosis not present

## 2020-07-24 DIAGNOSIS — Z4901 Encounter for fitting and adjustment of extracorporeal dialysis catheter: Secondary | ICD-10-CM | POA: Diagnosis not present

## 2020-07-24 DIAGNOSIS — F17210 Nicotine dependence, cigarettes, uncomplicated: Secondary | ICD-10-CM | POA: Insufficient documentation

## 2020-07-24 DIAGNOSIS — I132 Hypertensive heart and chronic kidney disease with heart failure and with stage 5 chronic kidney disease, or end stage renal disease: Secondary | ICD-10-CM | POA: Diagnosis not present

## 2020-07-24 IMAGING — US IR FLUORO GUIDE CV LINE*R*
2 series · 5 of 5 positions shown · non-contrast
Comparison: none

INDICATION: 64-year-old female with a history of renal failure, referred for
tunneled hemodialysis catheter

[Series 1: ir fluoro guide cv line*right* · 4 of 4 slices shown]
[im 1/4]
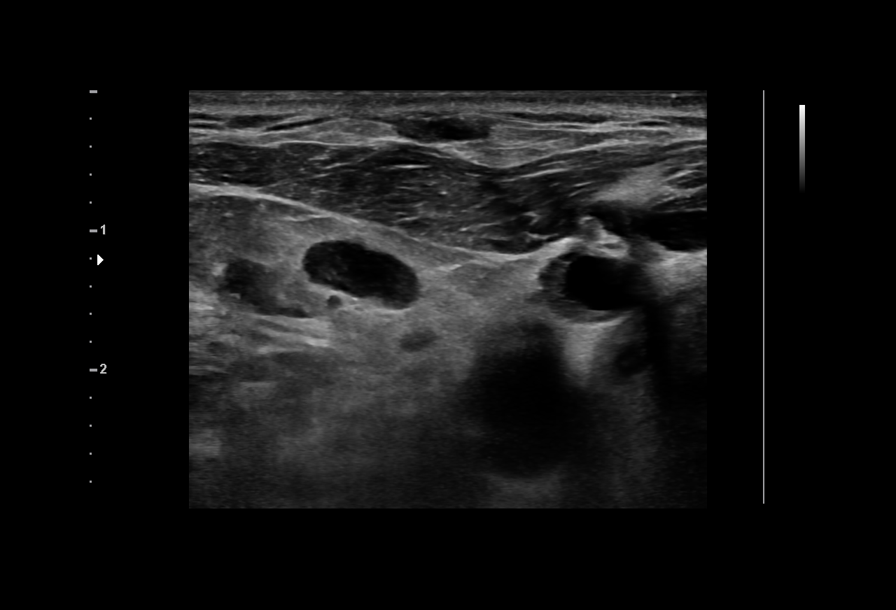
[im 2/4]
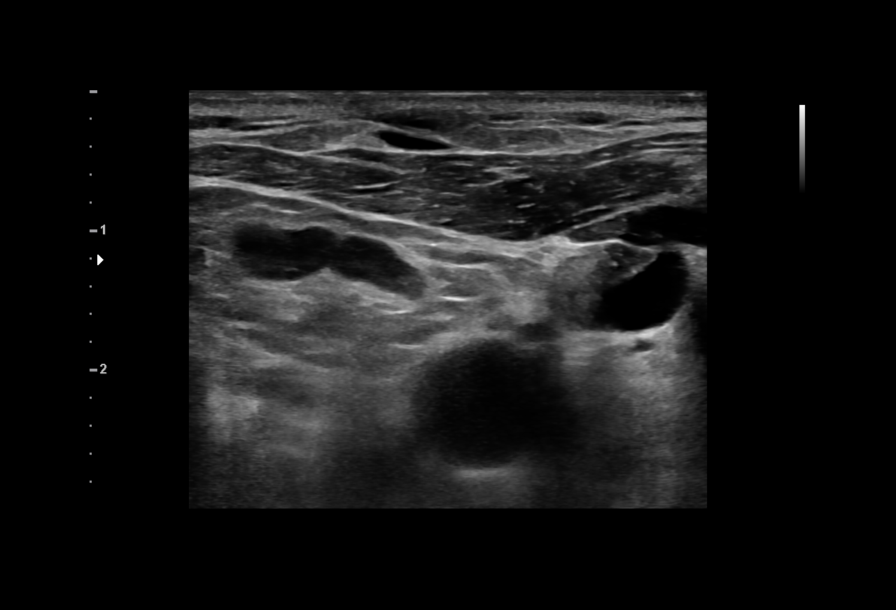
[im 3/4]
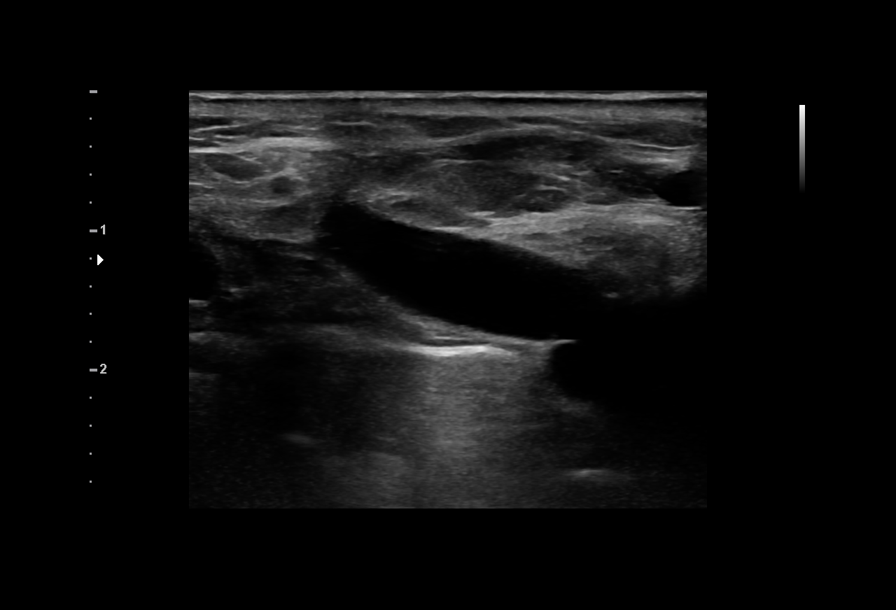
[im 4/4]
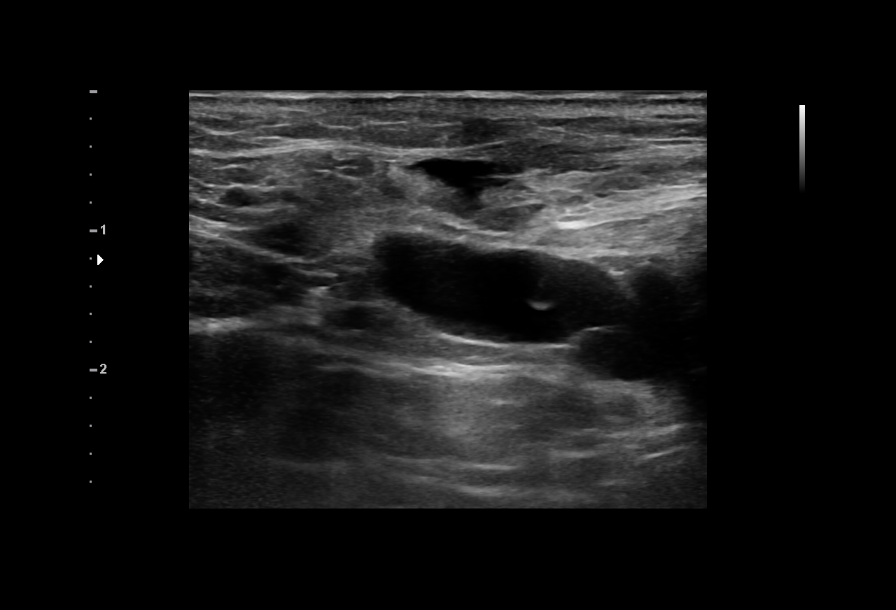

[Series 1: fl (-) angio · 1 of 1 slices shown]
[im 1/1]
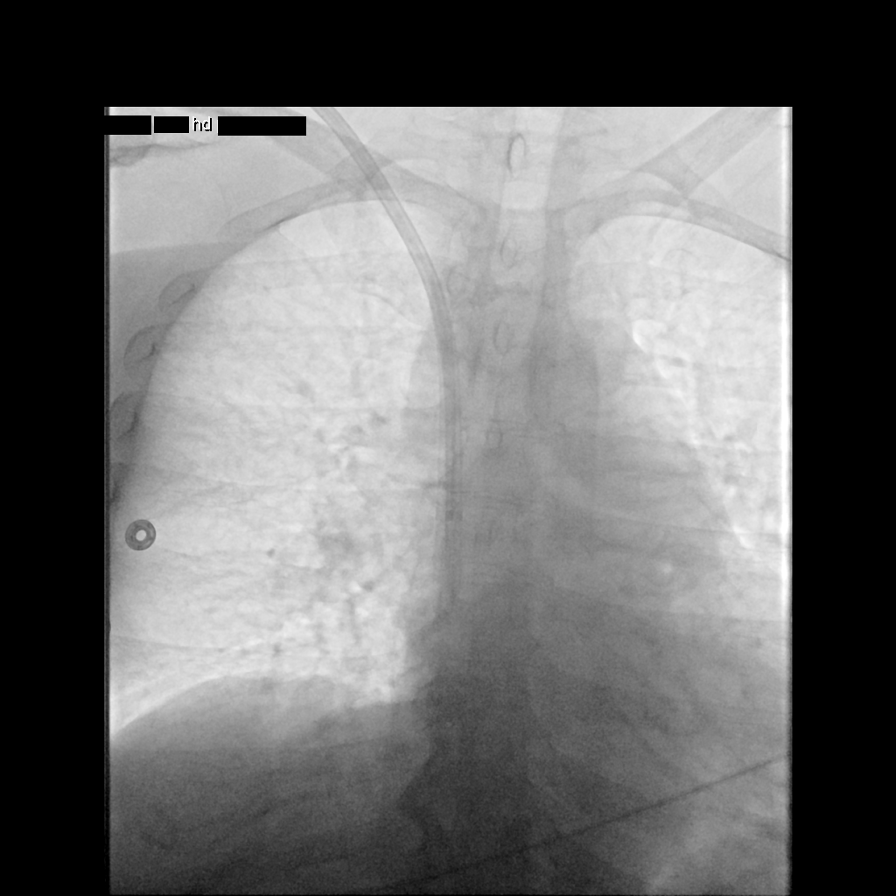

[5 of 5 positions shown; findings below may reference images not displayed]

EXAM:
TUNNELED CENTRAL VENOUS HEMODIALYSIS CATHETER PLACEMENT WITH
ULTRASOUND AND FLUOROSCOPIC GUIDANCE

MEDICATIONS:
2 g Ancef. The antibiotic was given in an appropriate time interval
prior to skin puncture.

ANESTHESIA/SEDATION:
Moderate (conscious) sedation was employed during this procedure. A
total of Versed 2.0 mg and Fentanyl 100 mcg was administered
intravenously.

Moderate Sedation Time: 15 minutes. The patient's level of
consciousness and vital signs were monitored continuously by
radiology nursing throughout the procedure under my direct
supervision.

FLUOROSCOPY TIME:  Fluoroscopy Time: 0 minutes 6 seconds (1 mGy).

COMPLICATIONS:
None



Ultrasound survey was performed. Right internal jugular vein appears
at least stenotic and small caliber, with a patent and enlarged
external jugular vein.

Micropuncture kit was utilized to access the right external jugular
vein under direct, real-time ultrasound guidance after the overlying
soft tissues were anesthetized with 1% lidocaine with epinephrine.
Stab incision was made with 11 blade scalpel. Microwire was passed
centrally. The microwire was then marked to measure appropriate
internal catheter length. External tunneled length was estimated. A
total tip to cuff length of 19 cm was selected.

035 guidewire was advanced to the level of the IVC.

Skin and subcutaneous tissues of chest wall below the clavicle were
generously infiltrated with 1% lidocaine for local anesthesia. A
small stab incision was made with 11 blade scalpel. The selected
hemodialysis catheter was tunneled in a retrograde fashion from the
anterior chest wall to the venotomy incision.

Serial dilation was performed and then a peel-away sheath was
placed.

The catheter was then placed through the peel-away sheath with tips
ultimately positioned within the superior aspect of the right
atrium. Final catheter positioning was confirmed and documented with
a spot radiographic image. The catheter aspirates and flushes
normally. The catheter was flushed with appropriate volume heparin
dwells.

The catheter exit site was secured with a 0-Prolene retention
suture. Gel-Foam slurry was infused into the soft tissue tract.

The venotomy incision was closed Derma bond and sterile dressing.
Dressings were applied at the chest wall.

Patient tolerated the procedure well and remained hemodynamically
stable throughout.

No complications were encountered and no significant blood loss
encountered.
IMPRESSION: Status post image guided placement of right external jugular vein
tunneled hemodialysis catheter. Catheter ready for use.

## 2020-07-24 MED ORDER — MIDAZOLAM HCL 2 MG/2ML IJ SOLN
INTRAMUSCULAR | Status: AC
  Filled 2020-07-24: qty 2

## 2020-07-24 MED ORDER — CEFAZOLIN SODIUM-DEXTROSE 2-4 GM/100ML-% IV SOLN
INTRAVENOUS | Status: AC
  Filled 2020-07-24: qty 100

## 2020-07-24 MED ORDER — CEFAZOLIN SODIUM-DEXTROSE 2-4 GM/100ML-% IV SOLN
2.0000 g | Freq: Once | INTRAVENOUS | Status: AC
  Administered 2020-07-24: 2 g via INTRAVENOUS

## 2020-07-24 MED ORDER — LIDOCAINE HCL 1 % IJ SOLN
INTRAMUSCULAR | Status: AC | PRN
  Administered 2020-07-24: 10 mL

## 2020-07-24 MED ORDER — MIDAZOLAM HCL 2 MG/2ML IJ SOLN
INTRAMUSCULAR | Status: AC | PRN
  Administered 2020-07-24 (×2): 1 mg via INTRAVENOUS

## 2020-07-24 MED ORDER — GELATIN ABSORBABLE 12-7 MM EX MISC
CUTANEOUS | Status: AC | PRN
  Administered 2020-07-24: 1 via TOPICAL

## 2020-07-24 MED ORDER — FENTANYL CITRATE (PF) 100 MCG/2ML IJ SOLN
INTRAMUSCULAR | Status: AC
  Filled 2020-07-24: qty 2

## 2020-07-24 MED ORDER — GELATIN ABSORBABLE 12-7 MM EX MISC
CUTANEOUS | Status: AC
  Filled 2020-07-24: qty 1

## 2020-07-24 MED ORDER — HEPARIN SODIUM (PORCINE) 1000 UNIT/ML IJ SOLN
INTRAMUSCULAR | Status: AC
  Filled 2020-07-24: qty 1

## 2020-07-24 MED ORDER — HEPARIN SODIUM (PORCINE) 1000 UNIT/ML IJ SOLN
INTRAMUSCULAR | Status: AC | PRN
  Administered 2020-07-24: 3200 [IU] via INTRAVENOUS

## 2020-07-24 MED ORDER — FENTANYL CITRATE (PF) 100 MCG/2ML IJ SOLN
INTRAMUSCULAR | Status: AC | PRN
  Administered 2020-07-24 (×2): 50 ug via INTRAVENOUS

## 2020-07-24 MED ORDER — LIDOCAINE HCL 1 % IJ SOLN
INTRAMUSCULAR | Status: AC
  Filled 2020-07-24: qty 20

## 2020-07-24 MED ORDER — OXYCODONE-ACETAMINOPHEN 5-325 MG PO TABS: 1.0000 | ORAL_TABLET | Freq: Once | ORAL | Status: DC

## 2020-07-24 MED ORDER — OXYCODONE-ACETAMINOPHEN 5-325 MG PO TABS
ORAL_TABLET | ORAL | Status: AC
  Administered 2020-07-24: 1
  Filled 2020-07-24: qty 1

## 2020-07-24 NOTE — H&P (Signed)
Chief Complaint: Patient was seen in consultation today for tunneled dialysis catheter placement at the request of Webb,Martin  Referring Physician(s): Webb,Martin  Supervising Physician: Corrie Mckusick  Patient Status: [facility] - Out[facility]  History of Present Illness: Whitney Walker is a 64 y.o. female   ESRD + Bacteremia and dialysis catheter removed 07/21/20 Has had 3 day Holiday per Dr [name]  Now for new placement  In IR  Pt receiving IV antibiotics at dialysis--- she is having dialysis today after new placement   Past Medical History:  Diagnosis Date  . Anemia   . Anxiety   . CHF (congestive heart failure) ([place])   . Colon cancer [facility])    treatment surgery  . Complication of anesthesia    after first C- [place] "couldnt walk after", patient denies having a spinal  . COPD (chronic obstructive pulmonary disease) ([place])   . Coronary artery disease   . Depression   . DVT (deep venous thrombosis) ([place])   . ESRD (end stage renal disease) ([place])    Hemo: MWF  . History of blood transfusion 04/2018  . Hypertension    07/07/18- no longer takes BP medications  . Meningitis   . Pain in limb 07/30/2013  . PE (pulmonary embolism)   . Peripheral vascular disease ([place])   . Restless legs   . Shortness of breath    with exertion  . Sleep apnea   . SOB (shortness of breath) 03/03/2019  . Vertigo     Past Surgical History:  Procedure Laterality Date  . ABDOMINAL AORTAGRAM N/A 07/26/2013   Procedure: ABDOMINAL Maxcine Ham;  Surgeon: [name] Hitchcock, MD;  Location: [facility] CATH LAB;  Service: Cardiovascular;  Laterality: N/A;  . ABDOMINAL HYSTERECTOMY    . AV FISTULA PLACEMENT Left 05/05/2018   Procedure: ARTERIOVENOUS (AV) FISTULA CREATION BRACHIOCEPHALIC;  Surgeon: Waynetta Sandy, MD;  Location: [facility];  Service: Vascular;  Laterality: Left;  . AV FISTULA PLACEMENT Left 07/16/2018   Procedure: ARTERIOVENOUS FISTULA CREATION;  Surgeon: Waynetta Sandy, MD;  Location: [facility];  Service: Vascular;  Laterality: Left;  . BASCILIC VEIN TRANSPOSITION Left 09/03/2018   Procedure: BASILIC VEIN TRANSPOSITION SECOND STAGE;  Surgeon: Waynetta Sandy, MD;  Location: [facility];  Service: Vascular;  Laterality: Left;  . BREAST BIOPSY Left   . CESAREAN SECTION     X 3 [id number]  . CHOLECYSTECTOMY    . CHOLECYSTECTOMY  1980/s  . COLECTOMY  2010  . DIVERTICULOSIS SURGERY-2002  2012  . FEMORAL-POPLITEAL BYPASS GRAFT Left 08/13/2013   Procedure: BYPASS GRAFT FEMORAL-POPLITEAL ARTERY WITH NON-REVERSED SAPHANEOUS VEIN; ULTRASOUND GUIDED;  Surgeon: Mal [name], MD;  Location: [facility];  Service: Vascular;  Laterality: Left;  . INSERTION OF DIALYSIS CATHETER Right 04/30/2018   Procedure: INSERTION OF Right Internal Jugular DIALYSIS CATHETER;  Surgeon: [name], MD;  Location: [facility] OR;  Service: Vascular;  Laterality: Right;  . IR REMOVAL TUN CV CATH W/O FL  07/21/2020  . LOWER EXTREMITY ANGIOGRAM Left 07/26/2013   Procedure: LOWER EXTREMITY ANGIOGRAM;  Surgeon: [name] Redby, MD;  Location: [facility] CATH LAB;  Service: Cardiovascular;  Laterality: Left;    Allergies: Carnosine, Gadolinium derivatives, Iohexol, Iodine, and Naltrexone  Medications: Prior to Admission medications   Medication Sig Start Date End Date Taking? Authorizing Provider  acetaminophen (TYLENOL) 500 MG tablet Take 1,000 mg by mouth 2 (two) times daily as needed for moderate pain or headache.   Yes [provider]  albuterol (PROVENTIL HFA;VENTOLIN  HFA) 108 (90 Base) MCG/ACT inhaler Inhale 2 puffs into the lungs every 6 (six) hours as needed for wheezing or shortness of breath.    Yes [provider]  apixaban (ELIQUIS) 2.5 MG TABS tablet Take 1 tablet (2.5 mg total) by mouth 2 (two) times daily. 05/18/20  Yes Truitt Merle, MD  Biotin 10000 MCG TABS Take 10,000 mcg by mouth daily.    Yes [provider]  carvedilol (COREG) 12.5 MG tablet Take 12.5 mg by mouth See admin instructions.  Takes Twice daily except Only takes once a day on dialysis days (holds in morning on Monday Wednesday and Friday)   Yes [provider]  meclizine (ANTIVERT) 12.5 MG tablet Take 12.5 mg by mouth 3 (three) times daily as needed for dizziness.   Yes [provider]  multivitamin (RENA-VIT) TABS tablet Take 1 tablet by mouth at bedtime.   Yes Edrick Oh, MD  mupirocin ointment (BACTROBAN) 2 % Apply 1 application topically daily as needed (skin bumps). 04/15/19  Yes [name], FNP  ondansetron (ZOFRAN ODT) 4 MG disintegrating tablet Take 1 tablet (4 mg total) by mouth every 6 (six) hours as needed. 06/08/20  Yes [name], FNP  oxyCODONE-acetaminophen (PERCOCET/ROXICET) 5-325 MG tablet Take 2 tablets by mouth every 4 (four) hours as needed for severe pain. Patient taking differently: Take 1 tablet by mouth every 4 (four) hours as needed for severe pain.  05/08/20  Yes Ward, Delice Bison, DO  sevelamer carbonate (RENVELA) 800 MG tablet Take 3,200 mg by mouth 3 (three) times daily with meals. Take 4 tablets with each meal 10/20/19  Yes [provider]  temazepam (RESTORIL) 15 MG capsule Take 1 capsule (15 mg total) by mouth at bedtime as needed for sleep. 05/04/20  Yes [name], FNP  nystatin (NYSTATIN) powder Apply topically 4 (four) times daily. Patient taking differently: Apply 1 application topically 4 (four) times daily as needed (irritation).  10/26/19   [name], FNP     Family History  Problem Relation Age of Onset  . Cancer Mother 23       breast and bone  . Cancer Father 9       prostate  . Hypertension Sister   . Bleeding Disorder Sister   . Cancer Cousin 20       breast cancer   . Hypertension Daughter   . Breast cancer Neg Hx     Social History   Socioeconomic History  . Marital status: Single    Spouse name: Not on file  . Number of children: 4  . Years of education: 12th gd  . Highest education level: Not on file  Occupational  History  . Occupation: N/A  Tobacco Use  . Smoking status: Current Some Day Smoker    Packs/day: 0.00    Years: 40.00    Pack years: 0.00    Types: Cigarettes  . Smokeless tobacco: Former Systems developer  . Tobacco comment: has an occasional cigarette - mostly when around people  Vaping Use  . Vaping Use: Never used  Substance and Sexual Activity  . Alcohol use: No    Alcohol/week: 0.0 standard drinks    Comment: she used to drink alcohol, quit in 2010   . Drug use: No  . Sexual activity: Not Currently  Other Topics Concern  . Not on file  Social History Narrative   2 cups of coffee a day    Social Determinants of Health   Financial Resource Strain:   .  Difficulty of Paying Living Expenses:   Food Insecurity:   . Worried About Charity fundraiser in the Last Year:   . Arboriculturist in the Last Year:   Transportation Needs:   . Film/video editor (Medical):   [name] Lack of Transportation (Non-Medical):   Physical Activity:   . Days of Exercise per Week:   . Minutes of Exercise per Session:   Stress:   . Feeling of Stress :   Social Connections:   . Frequency of Communication with Friends and Family:   . Frequency of Social Gatherings with Friends and Family:   . Attends Religious Services:   . Active Member of Clubs or Organizations:   . Attends Archivist Meetings:   [name] Marital Status:    Review of Systems: A 12 point ROS discussed and pertinent positives are indicated in the HPI above.  All other systems are negative.   Vital Signs: Pulse 76   Temp 98.4 F (36.9 C) (Oral)   Resp 16   Ht 5\' 2"  (1.575 m)   Wt 196 lb (88.9 kg)   SpO2 97%   BMI 35.85 kg/m   Physical Exam Vitals reviewed.  Cardiovascular:     Rate and Rhythm: Normal rate and regular rhythm.     Heart sounds: Normal heart sounds.  Pulmonary:     Effort: Pulmonary effort is normal.     Breath sounds: Normal breath sounds.  Abdominal:     Palpations: Abdomen is soft.  Musculoskeletal:         General: Normal range of motion.  Neurological:     Mental Status: She is alert and oriented to person, place, and time.  Psychiatric:        Behavior: Behavior normal.     Imaging: IR Removal Tun Cv Cath W/O FL  Result Date: 07/21/2020 INDICATION: Patient with ESRD and bacteremia presents today to have her tunneled dialysis catheter removed. She will return Monday 07/24/20 for the placement of a new tunneled dialysis catheter. EXAM: REMOVAL TUNNELED CENTRAL VENOUS CATHETER COMPLICATIONS: None immediate. PROCEDURE: Informed written consent was obtained from the patient after a thorough discussion of the procedural risks, benefits and alternatives. All questions were addressed. Maximal Sterile Barrier Technique was utilized including caps, mask, sterile gowns, sterile gloves, sterile drape, hand hygiene and skin antiseptic. A timeout was performed prior to the initiation of the procedure. The patient's right chest and catheter was prepped and draped in a normal sterile fashion. Heparin was removed from both ports of catheter. 1% lidocaine was used for local anesthesia. Using gentle blunt dissection the cuff of the catheter was exposed and the catheter was removed in it's entirety. Pressure was held till hemostasis was obtained. A sterile dressing was applied. The patient tolerated the procedure well with no immediate complications. IMPRESSION: Successful catheter removal as described above. Read by: Soyla Dryer, NP Electronically Signed   By: Lucrezia Europe M.D.   On: 07/21/2020 11:58    Labs:  CBC: Recent Labs    11/18/19 1239 05/09/20 0005 05/09/20 1246 05/18/20 1036  WBC 12.8* 13.0* 12.3* 9.7  HGB 11.3* 11.3* 11.1* 11.1*  HCT 35.5* 35.9* 35.2* 34.7*  PLT 240 250 221 196    COAGS: No results for input(s): INR, APTT in the last 8760 hours.  BMP: Recent Labs    05/09/20 0708 05/09/20 1246 05/10/20 0631 05/18/20 1036  NA 133* 136 135 133*  K >7.5* 4.8 5.1 5.1  CL 98 96*  97*  96*  CO2 20* 26 25 27   GLUCOSE 101* 71 100* 99  BUN 85* 19 35* 38*  CALCIUM 9.3 9.1 9.1 9.9  CREATININE 13.59* 5.80* 8.44* 6.98*  GFRNONAA 3* 7* 5* 6*  GFRAA 3* 8* 5* 7*    LIVER FUNCTION TESTS: Recent Labs    11/18/19 1239 05/09/20 0708 05/18/20 1036  BILITOT 0.4  --  0.3  AST 19  --  22  ALT 34  --  27  ALKPHOS 110  --  121  PROT 7.2  --  7.6  ALBUMIN 3.2* 3.0* 3.1*    TUMOR MARKERS: No results for input(s): AFPTM, CEA, CA199, CHROMGRNA in the last 8760 hours.  Assessment and Plan:  ESRD +bacteremia with tunneled catheter removal 7/23 in IR 3 day Line Holiday per Dr [name] Now for new placement Risks and benefits discussed with the patient including, but not limited to bleeding, infection, vascular injury, pneumothorax which may require chest tube placement, air embolism or even death  All of the patient's questions were answered, patient is agreeable to proceed. Consent signed and in chart.   Thank you for this interesting consult.  I greatly enjoyed meeting Whitney Walker and look forward to participating in their care.  A copy of this report was sent to the requesting provider on this date.  Electronically Signed: Lavonia Drafts, PA-C 07/24/2020, 10:52 AM   I spent a total of    25 Minutes in face to face in clinical consultation, greater than 50% of which was counseling/coordinating care for tunneled dialysis catheter placement

## 2020-07-24 NOTE — Procedures (Signed)
Interventional Radiology Procedure Note  Procedure: Placement of a right EJ approach tunneled HD catheter. 19cm tip to cuff.  Tip is positioned at the superior cavoatrial junction and catheter is ready for immediate use.  Complications: None Recommendations:  - Ok to use - Do not submerge - Routine line care   Signed,  Dulcy Fanny. Earleen Newport, DO

## 2020-07-24 NOTE — Progress Notes (Signed)
Blue  clamps given to pt with instructions to use if needed to hemo cath site. Discharge instructions given to pt voices understanding.

## 2020-07-24 NOTE — Discharge Instructions (Signed)
Central Line Dialysis Access Placement Central line dialysis access placement is a procedure to place a long, thin, plastic tube (catheter) into a vein in the neck and move it to a larger central vein near the heart. You may have this procedure if you have severe kidney disease and need treatment to filter your blood and remove extra fluid from your body (hemodialysis). Your health care provider may use this type of catheter placement if it is the only way to access your circulatory system for dialysis. Your health care provider may use this catheter temporarily until more permanent blood vessel access is possible. Tell a health care provider about:  Any allergies you have.  Any reactions you have had to X-ray dyes.  All medicines you are taking, including vitamins, herbs, eye drops, creams, and over-the-counter medicines.  Any problems you or family members have had with anesthetic medicines.  Any blood disorders you have.  Any surgeries you have had.  Any medical conditions you have.  Whether you are pregnant or may be pregnant. What are the risks? Generally, this is a safe procedure. However, problems may occur, including:  Infection.  Bleeding.  Allergic reactions to medicines or dyes.  Damage to other structures or organs in the neck or chest.  A blood clot that forms in the neck and travels to the heart.  A blood clot that blocks the catheter.  Catheter failure.  A collapsed lung (pneumothorax). What happens before the procedure?  Follow instructions from your health care provider about eating or drinking restrictions.  Ask your health care provider about changing or stopping your regular medicines. This is especially important if you are taking diabetes medicines or blood thinners.  Plan to have someone take you home after the procedure.  If you will be going home right after the procedure, plan to have someone with you for 24 hours. What happens during the  procedure?  To reduce your risk of infection: ? Your health care team will wash or sanitize their hands. ? Your skin will be washed with soap.  An IV tube will be inserted into one of your veins.  You will be given one or more of the following: ? A medicine to help you relax (sedative). ? A medicine to numb the area (local anesthetic).  You may be given antibiotic medicine through your IV tube.  A small incision will be made in your lower neck.  Ultrasound or a type of X-ray (fluoroscopy) will be used to find your vein.  A needle will be placed into your vein.  A guide wire may be placed through the needle and moved toward the central vein near your heart.  The needle will be removed, and the catheter will be guided into the central vein near your heart. Ultrasound or fluoroscopy may be used to guide the wire and catheter into place.  The guide wire will be removed after the catheter is in place.  The incision will be closed, and the catheter will be secured in place with stitches (sutures).  A bandage (dressing) will be placed over the incision and the catheter. The procedure may vary among health care providers and hospitals. What happens after the procedure?   Your blood pressure, heart rate, breathing rate, and blood oxygen level will be monitored until the medicines you were given have worn off.  Do not drive for 24 hours if you were given a sedative. This information is not intended to replace advice given to you by  your health care provider. Make sure you discuss any questions you have with your health care provider. Document Revised: 05/01/2018 Document Reviewed: 09/09/2016 Elsevier Patient Education  [place].

## 2020-07-24 NOTE — Progress Notes (Signed)
Pt c/o pain at [facility] cath site Jannifer Franklin, PA called and new orders noted. Pt also states she lives alone and has no one to stay with her tonight. Pt plans to have [facility] transport pt home.

## 2020-07-24 NOTE — Progress Notes (Signed)
Jannifer Franklin.PA called back states Dr Earleen Newport is ok to D/C home with [employer].

## 2020-07-25 DIAGNOSIS — Z992 Dependence on renal dialysis: Secondary | ICD-10-CM | POA: Diagnosis not present

## 2020-07-25 DIAGNOSIS — N186 End stage renal disease: Secondary | ICD-10-CM | POA: Diagnosis not present

## 2020-07-25 DIAGNOSIS — D689 Coagulation defect, unspecified: Secondary | ICD-10-CM | POA: Diagnosis not present

## 2020-07-25 DIAGNOSIS — N2581 Secondary hyperparathyroidism of renal origin: Secondary | ICD-10-CM | POA: Diagnosis not present

## 2020-07-25 DIAGNOSIS — T80219D Unspecified infection due to central venous catheter, subsequent encounter: Secondary | ICD-10-CM | POA: Diagnosis not present

## 2020-07-25 DIAGNOSIS — D631 Anemia in chronic kidney disease: Secondary | ICD-10-CM | POA: Diagnosis not present

## 2020-07-25 DIAGNOSIS — A498 Other bacterial infections of unspecified site: Secondary | ICD-10-CM | POA: Diagnosis not present

## 2020-07-26 DIAGNOSIS — D631 Anemia in chronic kidney disease: Secondary | ICD-10-CM | POA: Diagnosis not present

## 2020-07-26 DIAGNOSIS — N186 End stage renal disease: Secondary | ICD-10-CM | POA: Diagnosis not present

## 2020-07-26 DIAGNOSIS — I509 Heart failure, unspecified: Secondary | ICD-10-CM | POA: Diagnosis not present

## 2020-07-26 DIAGNOSIS — A498 Other bacterial infections of unspecified site: Secondary | ICD-10-CM | POA: Diagnosis not present

## 2020-07-26 DIAGNOSIS — D689 Coagulation defect, unspecified: Secondary | ICD-10-CM | POA: Diagnosis not present

## 2020-07-26 DIAGNOSIS — N2581 Secondary hyperparathyroidism of renal origin: Secondary | ICD-10-CM | POA: Diagnosis not present

## 2020-07-26 DIAGNOSIS — Z992 Dependence on renal dialysis: Secondary | ICD-10-CM | POA: Diagnosis not present

## 2020-07-26 DIAGNOSIS — T80219D Unspecified infection due to central venous catheter, subsequent encounter: Secondary | ICD-10-CM | POA: Diagnosis not present

## 2020-07-26 DIAGNOSIS — G4733 Obstructive sleep apnea (adult) (pediatric): Secondary | ICD-10-CM | POA: Diagnosis not present

## 2020-07-26 DIAGNOSIS — I2609 Other pulmonary embolism with acute cor pulmonale: Secondary | ICD-10-CM | POA: Diagnosis not present

## 2020-07-28 DIAGNOSIS — T80219D Unspecified infection due to central venous catheter, subsequent encounter: Secondary | ICD-10-CM | POA: Diagnosis not present

## 2020-07-28 DIAGNOSIS — Z992 Dependence on renal dialysis: Secondary | ICD-10-CM | POA: Diagnosis not present

## 2020-07-28 DIAGNOSIS — N2581 Secondary hyperparathyroidism of renal origin: Secondary | ICD-10-CM | POA: Diagnosis not present

## 2020-07-28 DIAGNOSIS — A498 Other bacterial infections of unspecified site: Secondary | ICD-10-CM | POA: Diagnosis not present

## 2020-07-28 DIAGNOSIS — D631 Anemia in chronic kidney disease: Secondary | ICD-10-CM | POA: Diagnosis not present

## 2020-07-28 DIAGNOSIS — N186 End stage renal disease: Secondary | ICD-10-CM | POA: Diagnosis not present

## 2020-07-28 DIAGNOSIS — D689 Coagulation defect, unspecified: Secondary | ICD-10-CM | POA: Diagnosis not present

## 2020-07-29 DIAGNOSIS — N186 End stage renal disease: Secondary | ICD-10-CM | POA: Diagnosis not present

## 2020-07-29 DIAGNOSIS — Z992 Dependence on renal dialysis: Secondary | ICD-10-CM | POA: Diagnosis not present

## 2020-07-29 DIAGNOSIS — I129 Hypertensive chronic kidney disease with stage 1 through stage 4 chronic kidney disease, or unspecified chronic kidney disease: Secondary | ICD-10-CM | POA: Diagnosis not present

## 2020-07-31 ENCOUNTER — Telehealth: Payer: Self-pay

## 2020-07-31 DIAGNOSIS — Z992 Dependence on renal dialysis: Secondary | ICD-10-CM | POA: Diagnosis not present

## 2020-07-31 DIAGNOSIS — D631 Anemia in chronic kidney disease: Secondary | ICD-10-CM | POA: Diagnosis not present

## 2020-07-31 DIAGNOSIS — N2581 Secondary hyperparathyroidism of renal origin: Secondary | ICD-10-CM | POA: Diagnosis not present

## 2020-07-31 DIAGNOSIS — D689 Coagulation defect, unspecified: Secondary | ICD-10-CM | POA: Diagnosis not present

## 2020-07-31 DIAGNOSIS — A498 Other bacterial infections of unspecified site: Secondary | ICD-10-CM | POA: Diagnosis not present

## 2020-07-31 DIAGNOSIS — N186 End stage renal disease: Secondary | ICD-10-CM | POA: Diagnosis not present

## 2020-07-31 DIAGNOSIS — D509 Iron deficiency anemia, unspecified: Secondary | ICD-10-CM | POA: Diagnosis not present

## 2020-07-31 NOTE — Chronic Care Management (AMB) (Signed)
Chronic Care Management Pharmacy Assistant   Name: Raiyah Speakman  MRN: [id number] DOB: 1956-05-05  Reason for Encounter: Medication Review/ Monthly Dispensing Call 07/31/2020 @ 1030, after reviewing chart, called patient, no answer, left message to return call. Tm 08/01/2020 @ 2:25 Left message for patient to return call. tm 08/02/2020 @9 :24, Spoke with patient she is at dialysis, will call patient back after 12pm. Called patient back @1 :57pm, LMTRC, I know patient just had dialysis today so she might be tired, left message that she can call me back tomorrow.  08/03/2020, [place]. Patient called back, discussed medications needed for delivery and those needed from visit with [name], FNP today.     PCP : [name], FNP  Allergies:   Allergies  Allergen Reactions  . Carnosine     Other reaction(s): Unknown  . Gadolinium Derivatives Hives and Other (See Comments)    HIVES, Desc: HIVES W/ "DYE" USED FOR 1ST CT SCAN BUT NOT 2ND, NO PREMEDS USED, PT UNCERTAIN OF CIRCUMSTANCES,,?POSSIBLE MRI CONTRAST ALLERGY, ALL STUDIES DONE "SOMEWHERE" IN [place]//A.C., Onset Date: [id number]  . Iohexol Other (See Comments)     Code: HIVES, Desc: HIVES W/ "DYE" USED FOR 1ST CT SCAN BUT NOT 2ND, NO PREMEDS USED, PT UNCERTAIN OF CIRCUMSTANCES,,?POSSIBLE MRI CONTRAST ALLERGY, ALL STUDIES DONE "SOMEWHERE" IN [place]//A.C., Onset Date: [id number]   . Iodine Hives  . Naltrexone     Other reaction(s): Unknown    Medications: Outpatient Encounter Medications as of 07/31/2020  Medication Sig  . acetaminophen (TYLENOL) 500 MG tablet Take 1,000 mg by mouth 2 (two) times daily as needed for moderate pain or headache.  . albuterol (PROVENTIL HFA;VENTOLIN HFA) 108 (90 Base) MCG/ACT inhaler Inhale 2 puffs into the lungs every 6 (six) hours as needed for wheezing or shortness of breath.   [name] apixaban (ELIQUIS) 2.5 MG TABS tablet Take 1 tablet (2.5 mg total) by mouth 2 (two) times daily.  . Biotin 10000 MCG  TABS Take 10,000 mcg by mouth daily.   . carvedilol (COREG) 12.5 MG tablet Take 12.5 mg by mouth See admin instructions. Takes Twice daily except Only takes once a day on dialysis days (holds in morning on Monday Wednesday and Friday)  . meclizine (ANTIVERT) 12.5 MG tablet Take 12.5 mg by mouth 3 (three) times daily as needed for dizziness.  . multivitamin (RENA-VIT) TABS tablet Take 1 tablet by mouth at bedtime.  . mupirocin ointment (BACTROBAN) 2 % Apply 1 application topically daily as needed (skin bumps).  . nystatin (NYSTATIN) powder Apply topically 4 (four) times daily. (Patient taking differently: Apply 1 application topically 4 (four) times daily as needed (irritation). )  . ondansetron (ZOFRAN ODT) 4 MG disintegrating tablet Take 1 tablet (4 mg total) by mouth every 6 (six) hours as needed.  [name] oxyCODONE-acetaminophen (PERCOCET/ROXICET) 5-325 MG tablet Take 2 tablets by mouth every 4 (four) hours as needed for severe pain. (Patient taking differently: Take 1 tablet by mouth every 4 (four) hours as needed for severe pain. )  . sevelamer carbonate (RENVELA) 800 MG tablet Take 3,200 mg by mouth 3 (three) times daily with meals. Take 4 tablets with each meal  . temazepam (RESTORIL) 15 MG capsule Take 1 capsule (15 mg total) by mouth at bedtime as needed for sleep.   No facility-administered encounter medications on file as of 07/31/2020.    Current Diagnosis: Patient Active Problem List   Diagnosis Date Noted  . Hyperkalemia 05/09/2020  . Acute respiratory failure with hypoxia ([place])  05/09/2020  . Left leg pain 05/09/2020  . Restless leg syndrome 09/19/2018  . OSA (obstructive sleep apnea) 04/29/2018  . ESRD on dialysis ([place]) 04/29/2018  . Dyspnea on exertion 03/12/2017  . Morbid (severe) obesity due to excess calories ([place]) 03/12/2017  . Anemia of chronic kidney failure, stage 3 (moderate) 12/10/2016  . History of colon cancer 08/12/2016  . Mass of left breast on mammogram 12/10/2015  .  HTN (hypertension) 10/18/2015  . Depression 10/18/2015  . Numbness and tingling of left arm and leg 01/24/2015  . PVD (peripheral vascular disease) with claudication ([place]) 03/23/2014  . PAD (peripheral artery disease) ([place]) 12/14/2013  . Chronic diastolic heart failure ([place]) 11/15/2013  . Essential hypertension 11/15/2013  . Leucocytosis 04/28/2012    Reviewed chart for medication changes ahead of medication coordination call. Patient had a visit with [name] FNP 08/03/2020 and Dialysis 07/31/2020 [facility]   Medication changes and recent visit plan here. 08/03/2020- Visit with [name], FNP to discuss insomnia, Temazepam was d/c and patient prescribed Lunesta 2mg  to take 1 tablet at bedtime as needed for sleep. Patient also given Nystatin Powder to apply to catheter site for dialysis.  BP Readings from Last 3 Encounters:  07/24/20 (!) 193/90  05/18/20 (!) 149/81  05/16/20 (!) 174/83    Lab Results  Component Value Date   HGBA1C 5.1 06/04/2018    Patient Questions:  1.  Have you seen any other providers since your last visit? Yes, 08/05/2021[name], FNP and on 07/31/2020 [facility] for Dialysis.  2.  Any changes in your medicines or health? Yes, New prescription given for Insomnia- Lunesta 2 mg, to take 1 tablet at bedtime and Nystatin Powder to apply four times a day to affected area as needed. Temazepam d/c 08/03/2020 by [name], FNP.  Patient obtains medications through Adherence Packaging and Vials  30 Days   Last adherence delivery included:  1. Carvedilol 12.5mg , 1 tablet by mouth twice a day. 2.  Eliquis 2.5mg , 1 tablet by mouth twice a day.  Patient declined Ondansetron 4 mg odt last month due to PRN use/additional supply on hand.  Patient is due for next adherence delivery on: 08/06/2020. Called patient and reviewed medications and coordinated delivery on 08/03/2020  This delivery to include: 1. Carvedilol 12.5mg , 1 tablet by mouth twice  a day. 2.  Eliquis 2.5mg , 1 tablet by mouth twice a day. 3. Meclizine 12.5mg , 1 tablet by mouth three times a day as needed- Will need new prescription sent to the pharmacy 4. Oxycodone/acetaminophen 5/325 mg, 2 tablets by mouth every four hours as needed for severe pain- Will need new prescription sent to the pharmacy.   5. Albuterol 108 mcg/act inhaler, 2 puffs into the lungs every 6 hours as needed for wheezing or shortness of breath.   Patient will need a short fill of Lunesta 2 mg and Nystatin 100gm , prior to adherence delivery.  Coordinated acute fill for Lunesta 2 mg and Nystatin 100gm to be delivered 08/03/2020.  Patient declined the following medications: Temazepam 15 mg due to not helping with Insomnia.  Patient needs refills for . 1. Carvedilol 12.5mg , 1 tablet by mouth twice a day. 2.  Eliquis 2.5mg , 1 tablet by mouth twice a day. 3. Meclizine 12.5mg , 1 tablet by mouth three times a day as needed- Will need new prescription sent to the pharmacy 4. Oxycodone/acetaminophen 5/325 mg, 2 tablets by mouth every four hours as needed for severe pain- Will need  new prescription sent to the pharmacy.   Patient will need a short fill of Lunesta 2 mg and Nystatin 100gm , prior to adherence delivery. 5. Albuterol 108 mcg/act inhaler, 2 puffs into the lungs every 6 hours as needed for wheezing or shortness of breath.   Confirmed delivery date of 08/06/2020, advised patient that pharmacy will contact them the morning of delivery. Follow-Up:  Pharmacist Review - Patient will need new prescriptions on Meclizine, Oxycodone and Proventil, advising [employer], CPP.  [name], [facility] Pharmacist Assistant [phone]

## 2020-08-02 DIAGNOSIS — Z992 Dependence on renal dialysis: Secondary | ICD-10-CM | POA: Diagnosis not present

## 2020-08-02 DIAGNOSIS — N186 End stage renal disease: Secondary | ICD-10-CM | POA: Diagnosis not present

## 2020-08-02 DIAGNOSIS — D689 Coagulation defect, unspecified: Secondary | ICD-10-CM | POA: Diagnosis not present

## 2020-08-02 DIAGNOSIS — N2581 Secondary hyperparathyroidism of renal origin: Secondary | ICD-10-CM | POA: Diagnosis not present

## 2020-08-02 DIAGNOSIS — D631 Anemia in chronic kidney disease: Secondary | ICD-10-CM | POA: Diagnosis not present

## 2020-08-02 DIAGNOSIS — D509 Iron deficiency anemia, unspecified: Secondary | ICD-10-CM | POA: Diagnosis not present

## 2020-08-02 DIAGNOSIS — A498 Other bacterial infections of unspecified site: Secondary | ICD-10-CM | POA: Diagnosis not present

## 2020-08-03 ENCOUNTER — Encounter: Payer: Self-pay | Admitting: Nurse Practitioner

## 2020-08-03 ENCOUNTER — Other Ambulatory Visit: Payer: Self-pay

## 2020-08-03 ENCOUNTER — Ambulatory Visit (INDEPENDENT_AMBULATORY_CARE_PROVIDER_SITE_OTHER): Payer: Medicare Other | Admitting: Nurse Practitioner

## 2020-08-03 ENCOUNTER — Ambulatory Visit (INDEPENDENT_AMBULATORY_CARE_PROVIDER_SITE_OTHER): Payer: Medicare Other

## 2020-08-03 VITALS — BP 190/84 | HR 80 | Temp 98.5°F | Wt 200.0 lb

## 2020-08-03 VITALS — BP 190/84 | HR 80 | Temp 98.5°F | Ht 61.0 in | Wt 200.0 lb

## 2020-08-03 DIAGNOSIS — G4700 Insomnia, unspecified: Secondary | ICD-10-CM | POA: Diagnosis not present

## 2020-08-03 DIAGNOSIS — N186 End stage renal disease: Secondary | ICD-10-CM | POA: Diagnosis not present

## 2020-08-03 DIAGNOSIS — I10 Essential (primary) hypertension: Secondary | ICD-10-CM | POA: Diagnosis not present

## 2020-08-03 DIAGNOSIS — G4733 Obstructive sleep apnea (adult) (pediatric): Secondary | ICD-10-CM

## 2020-08-03 DIAGNOSIS — R21 Rash and other nonspecific skin eruption: Secondary | ICD-10-CM

## 2020-08-03 DIAGNOSIS — Z0000 Encounter for general adult medical examination without abnormal findings: Secondary | ICD-10-CM | POA: Diagnosis not present

## 2020-08-03 MED ORDER — NYSTATIN 100000 UNIT/GM EX POWD: 1.0000 "application " | Freq: Four times a day (QID) | CUTANEOUS | 1 refills | Status: DC | PRN

## 2020-08-03 MED ORDER — MECLIZINE HCL 12.5 MG PO TABS: 12.5000 mg | ORAL_TABLET | Freq: Three times a day (TID) | ORAL | 0 refills | Status: DC | PRN

## 2020-08-03 MED ORDER — ESZOPICLONE 2 MG PO TABS: 2.0000 mg | ORAL_TABLET | Freq: Every evening | ORAL | 1 refills | Status: DC | PRN

## 2020-08-03 MED ORDER — ALBUTEROL SULFATE HFA 108 (90 BASE) MCG/ACT IN AERS: 2.0000 | INHALATION_SPRAY | Freq: Four times a day (QID) | RESPIRATORY_TRACT | 0 refills | Status: DC | PRN

## 2020-08-03 MED ORDER — NYSTATIN 100000 UNIT/GM EX POWD: 1.0000 "application " | Freq: Four times a day (QID) | CUTANEOUS | 0 refills | Status: DC | PRN

## 2020-08-03 NOTE — Patient Instructions (Signed)
Whitney Walker , Thank you for taking time to come for your Medicare Wellness Visit. I appreciate your ongoing commitment to your health goals. Please review the following plan we discussed and let me know if I can assist you in the future.   Screening recommendations/referrals: Colonoscopy: completed 07/24/2017 Mammogram: completed 01/25/2020 Bone Density: declined Recommended yearly ophthalmology/optometry visit for glaucoma screening and checkup Recommended yearly dental visit for hygiene and checkup  Vaccinations: Influenza vaccine: due Pneumococcal vaccine: declined prevnar 13 Tdap vaccine: declined Shingles vaccine: decline   Covid-19:04/07/2020, 03/10/2020  Advanced directives: Please bring a copy of your POA (Power of Attorney) and/or Living Will to your next appointment.   Conditions/risks identified: none  Next appointment: Follow up in one year for your annual wellness visit    Preventive Care 65 Years and Older, Female Preventive care refers to lifestyle choices and visits with your health care provider that can promote health and wellness. What does preventive care include?  A yearly physical exam. This is also called an annual well check.  Dental exams once or twice a year.  Routine eye exams. Ask your health care provider how often you should have your eyes checked.  Personal lifestyle choices, including:  Daily care of your teeth and gums.  Regular physical activity.  Eating a healthy diet.  Avoiding tobacco and drug use.  Limiting alcohol use.  Practicing safe sex.  Taking low-dose aspirin every day.  Taking vitamin and mineral supplements as recommended by your health care provider. What happens during an annual well check? The services and screenings done by your health care provider during your annual well check will depend on your age, overall health, lifestyle risk factors, and family history of disease. Counseling  Your health care provider may ask  you questions about your:  Alcohol use.  Tobacco use.  Drug use.  Emotional well-being.  Home and relationship well-being.  Sexual activity.  Eating habits.  History of falls.  Memory and ability to understand (cognition).  Work and work Statistician.  Reproductive health. Screening  You may have the following tests or measurements:  Height, weight, and BMI.  Blood pressure.  Lipid and cholesterol levels. These may be checked every 5 years, or more frequently if you are over 85 years old.  Skin check.  Lung cancer screening. You may have this screening every year starting at age 86 if you have a 30-pack-year history of smoking and currently smoke or have quit within the past 15 years.  Fecal occult blood test (FOBT) of the stool. You may have this test every year starting at age 60.  Flexible sigmoidoscopy or colonoscopy. You may have a sigmoidoscopy every 5 years or a colonoscopy every 10 years starting at age 81.  Hepatitis C blood test.  Hepatitis B blood test.  Sexually transmitted disease (STD) testing.  Diabetes screening. This is done by checking your blood sugar (glucose) after you have not eaten for a while (fasting). You may have this done every 1-3 years.  Bone density scan. This is done to screen for osteoporosis. You may have this done starting at age 35.  Mammogram. This may be done every 1-2 years. Talk to your health care provider about how often you should have regular mammograms. Talk with your health care provider about your test results, treatment options, and if necessary, the need for more tests. Vaccines  Your health care provider may recommend certain vaccines, such as:  Influenza vaccine. This is recommended every year.  Tetanus,  diphtheria, and acellular pertussis (Tdap, Td) vaccine. You may need a Td booster every 10 years.  Zoster vaccine. You may need this after age 15.  Pneumococcal 13-valent conjugate (PCV13) vaccine. One dose  is recommended after age 44.  Pneumococcal polysaccharide (PPSV23) vaccine. One dose is recommended after age 75. Talk to your health care provider about which screenings and vaccines you need and how often you need them. This information is not intended to replace advice given to you by your health care provider. Make sure you discuss any questions you have with your health care provider. Document Released: 01/12/2016 Document Revised: 09/04/2016 Document Reviewed: 10/17/2015 Elsevier Interactive Patient Education  2017 [place] Prevention in the Home Falls can cause injuries. They can happen to people of all ages. There are many things you can do to make your home safe and to help prevent falls. What can I do on the outside of my home?  Regularly fix the edges of walkways and driveways and fix any cracks.  Remove anything that might make you trip as you walk through a door, such as a raised step or threshold.  Trim any bushes or trees on the path to your home.  Use bright outdoor lighting.  Clear any walking paths of anything that might make someone trip, such as rocks or tools.  Regularly check to see if handrails are loose or broken. Make sure that both sides of any steps have handrails.  Any raised decks and porches should have guardrails on the edges.  Have any leaves, snow, or ice cleared regularly.  Use sand or salt on walking paths during winter.  Clean up any spills in your garage right away. This includes oil or grease spills. What can I do in the bathroom?  Use night lights.  Install grab bars by the toilet and in the tub and shower. Do not use towel bars as grab bars.  Use non-skid mats or decals in the tub or shower.  If you need to sit down in the shower, use a plastic, non-slip stool.  Keep the floor dry. Clean up any water that spills on the floor as soon as it happens.  Remove soap buildup in the tub or shower regularly.  Attach bath mats  securely with double-sided non-slip rug tape.  Do not have throw rugs and other things on the floor that can make you trip. What can I do in the bedroom?  Use night lights.  Make sure that you have a light by your bed that is easy to reach.  Do not use any sheets or blankets that are too big for your bed. They should not hang down onto the floor.  Have a firm chair that has side arms. You can use this for support while you get dressed.  Do not have throw rugs and other things on the floor that can make you trip. What can I do in the kitchen?  Clean up any spills right away.  Avoid walking on wet floors.  Keep items that you use a lot in easy-to-reach places.  If you need to reach something above you, use a strong step stool that has a grab bar.  Keep electrical cords out of the way.  Do not use floor polish or wax that makes floors slippery. If you must use wax, use non-skid floor wax.  Do not have throw rugs and other things on the floor that can make you trip. What can I do with  my stairs?  Do not leave any items on the stairs.  Make sure that there are handrails on both sides of the stairs and use them. Fix handrails that are broken or loose. Make sure that handrails are as long as the stairways.  Check any carpeting to make sure that it is firmly attached to the stairs. Fix any carpet that is loose or worn.  Avoid having throw rugs at the top or bottom of the stairs. If you do have throw rugs, attach them to the floor with carpet tape.  Make sure that you have a light switch at the top of the stairs and the bottom of the stairs. If you do not have them, ask someone to add them for you. What else can I do to help prevent falls?  Wear shoes that:  Do not have high heels.  Have rubber bottoms.  Are comfortable and fit you well.  Are closed at the toe. Do not wear sandals.  If you use a stepladder:  Make sure that it is fully opened. Do not climb a closed  stepladder.  Make sure that both sides of the stepladder are locked into place.  Ask someone to hold it for you, if possible.  Clearly mark and make sure that you can see:  Any grab bars or handrails.  First and last steps.  Where the edge of each step is.  Use tools that help you move around (mobility aids) if they are needed. These include:  Canes.  Walkers.  Scooters.  Crutches.  Turn on the lights when you go into a dark area. Replace any light bulbs as soon as they burn out.  Set up your furniture so you have a clear path. Avoid moving your furniture around.  If any of your floors are uneven, fix them.  If there are any pets around you, be aware of where they are.  Review your medicines with your doctor. Some medicines can make you feel dizzy. This can increase your chance of falling. Ask your doctor what other things that you can do to help prevent falls. This information is not intended to replace advice given to you by your health care provider. Make sure you discuss any questions you have with your health care provider. Document Released: 10/12/2009 Document Revised: 05/23/2016 Document Reviewed: 01/20/2015 Elsevier Interactive Patient Education  2017 [employer].

## 2020-08-03 NOTE — Progress Notes (Signed)
This visit occurred during the SARS-CoV-2 public health emergency.  Safety protocols were in place, including screening questions prior to the visit, additional usage of staff PPE, and extensive cleaning of exam room while observing appropriate contact time as indicated for disinfecting solutions.  Subjective:     Patient ID: Whitney Walker , female    DOB: Jan 30, 1956 , 64 y.o.   MRN: [id number]   Chief Complaint  Patient presents with  . Insomnia    patient stated the medication is not working for her at all     HPI  She has been taking the temazepam but it did not work for her.    When she goes to the dialysis center her blood pressure is elevated.  She reports they have to get permission to put her on dialysis if elevated prior.  She is going to [facility].  She tells me she had a bacteria in her blood, she is having problems with her catheter for dialysis.  She is having pain to the area but is lightening up now.   Insomnia Primary symptoms: sleep disturbance, no frequent awakening, no malaise/fatigue.  The current episode started more than one month. The onset quality is gradual. The problem occurs nightly. PMH includes: associated symptoms present.  Hypertension This is a chronic problem. The current episode started more than 1 year ago. The problem has been gradually worsening since onset. The problem is controlled. Pertinent negatives include no anxiety, chest pain, headaches, malaise/fatigue or palpitations. There are no associated agents to hypertension. Risk factors for coronary artery disease include obesity and sedentary lifestyle. Past treatments include beta blockers. There are no compliance problems.  There is no history of angina. Identifiable causes of hypertension include chronic renal disease (Hemodialysis).     Past Medical History:  Diagnosis Date  . Anemia   . Anxiety   . CHF (congestive heart failure) ([place])   . Colon cancer [facility])     treatment surgery  . Complication of anesthesia    after first [facility] [place] "couldnt walk after", patient denies having a spinal  . COPD (chronic obstructive pulmonary disease) ([place])   . Coronary artery disease   . Depression   . DVT (deep venous thrombosis) ([place])   . ESRD (end stage renal disease) ([place])    Hemo: MWF  . History of blood transfusion 04/2018  . Hypertension    07/07/18- no longer takes BP medications  . Meningitis   . Pain in limb 07/30/2013  . PE (pulmonary embolism)   . Peripheral vascular disease ([place])   . Restless legs   . Shortness of breath    with exertion  . Sleep apnea   . SOB (shortness of breath) 03/03/2019  . Vertigo      Family History  Problem Relation Age of Onset  . Cancer Mother 42       breast and bone  . Cancer Father 43       prostate  . Hypertension Sister   . Bleeding Disorder Sister   . Cancer Cousin 20       breast cancer   . Hypertension Daughter   . Breast cancer Neg Hx      Current Outpatient Medications:  .  acetaminophen (TYLENOL) 500 MG tablet, Take 1,000 mg by mouth 2 (two) times daily as needed for moderate pain or headache., Disp: , Rfl:  .  apixaban (ELIQUIS) 2.5 MG TABS tablet, Take 1 tablet (2.5 mg total) by mouth 2 (  two) times daily., Disp: 180 tablet, Rfl: 3 .  Ascorbic Acid (VITAMIN C) 100 MG tablet, Take 100 mg by mouth daily., Disp: , Rfl:  .  Biotin 10000 MCG TABS, Take 10,000 mcg by mouth daily. , Disp: , Rfl:  .  carvedilol (COREG) 12.5 MG tablet, Take 12.5 mg by mouth See admin instructions. Takes Twice daily except Only takes once a day on dialysis days (holds in morning on Monday Wednesday and Friday), Disp: , Rfl:  .  multivitamin (RENA-VIT) TABS tablet, Take 1 tablet by mouth at bedtime., Disp: , Rfl:  .  mupirocin ointment (BACTROBAN) 2 %, Apply 1 application topically daily as needed (skin bumps)., Disp: 22 g, Rfl: 2 .  oxyCODONE-acetaminophen (PERCOCET/ROXICET) 5-325 MG tablet, Take 2 tablets by mouth every 4  (four) hours as needed for severe pain. (Patient taking differently: Take 1 tablet by mouth every 4 (four) hours as needed for severe pain. ), Disp: 16 tablet, Rfl: 0 .  sevelamer carbonate (RENVELA) 800 MG tablet, Take 3,200 mg by mouth 3 (three) times daily with meals. Take 4 tablets with each meal, Disp: , Rfl:  .  vitamin E (VITAMIN E) 200 UNIT capsule, Take 200 Units by mouth daily., Disp: , Rfl:  .  albuterol (VENTOLIN HFA) 108 (90 Base) MCG/ACT inhaler, Inhale 2 puffs into the lungs every 6 (six) hours as needed for wheezing or shortness of breath., Disp: 18 g, Rfl: 0 .  eszopiclone (LUNESTA) 2 MG TABS tablet, Take 1 tablet (2 mg total) by mouth at bedtime as needed for sleep. Take immediately before bedtime, Disp: 30 tablet, Rfl: 1 .  meclizine (ANTIVERT) 12.5 MG tablet, Take 1 tablet (12.5 mg total) by mouth 3 (three) times daily as needed for dizziness., Disp: 30 tablet, Rfl: 0 .  nystatin (NYSTATIN) powder, Apply 1 application topically 4 (four) times daily as needed., Disp: 30 g, Rfl: 1 .  ondansetron (ZOFRAN ODT) 4 MG disintegrating tablet, Take 1 tablet (4 mg total) by mouth every 6 (six) hours as needed. (Patient not taking: Reported on 08/03/2020), Disp: 20 tablet, Rfl: 0 .  temazepam (RESTORIL) 15 MG capsule, Take 1 capsule (15 mg total) by mouth at bedtime as needed for sleep. (Patient not taking: Reported on 08/03/2020), Disp: 30 capsule, Rfl: 2   Allergies  Allergen Reactions  . Carnosine     Other reaction(s): Unknown  . Gadolinium Derivatives Hives and Other (See Comments)    HIVES, Desc: HIVES W/ "DYE" USED FOR 1ST CT SCAN BUT NOT 2ND, NO PREMEDS USED, PT UNCERTAIN OF CIRCUMSTANCES,,?POSSIBLE MRI CONTRAST ALLERGY, ALL STUDIES DONE "SOMEWHERE" IN [place]//A.C., Onset Date: [id number]  . Iohexol Other (See Comments)     Code: HIVES, Desc: HIVES W/ "DYE" USED FOR 1ST CT SCAN BUT NOT 2ND, NO PREMEDS USED, PT UNCERTAIN OF CIRCUMSTANCES,,?POSSIBLE MRI CONTRAST ALLERGY, ALL STUDIES  DONE "SOMEWHERE" IN [place]//A.C., Onset Date: [id number]   . Iodine Hives  . Naltrexone     Other reaction(s): Unknown     Review of Systems  Constitutional: Negative.  Negative for malaise/fatigue.  Respiratory: Negative.   Cardiovascular: Negative.  Negative for chest pain and palpitations.  Gastrointestinal: Negative.   Neurological: Negative for headaches.  Psychiatric/Behavioral: Positive for sleep disturbance. The patient has insomnia.      Today's Vitals   08/03/20 0911  BP: (!) 190/84  Pulse: 80  Temp: 98.5 F (36.9 C)  TempSrc: Oral  Weight: 200 lb (90.7 kg)  PainSc: 0-No pain   Body mass index  is 36.58 kg/m.   Objective:  Physical Exam Constitutional:      General: She is not in acute distress.    Appearance: Normal appearance. She is obese.  Cardiovascular:     Rate and Rhythm: Normal rate and regular rhythm.     Pulses: Normal pulses.     Heart sounds: Normal heart sounds. No murmur heard.   Pulmonary:     Effort: Pulmonary effort is normal. No respiratory distress.     Breath sounds: Normal breath sounds. No wheezing.  Neurological:     General: No focal deficit present.     Mental Status: She is alert and oriented to person, place, and time.  Psychiatric:        Mood and Affect: Mood normal.        Behavior: Behavior normal.        Thought Content: Thought content normal.        Judgment: Judgment normal.         Assessment And Plan:     1. Insomnia, unspecified type  Will try her on lunesta she was unable to tolerate temazepam feels like caused her restless leg - eszopiclone (LUNESTA) 2 MG TABS tablet; Take 1 tablet (2 mg total) by mouth at bedtime as needed for sleep. Take immediately before bedtime  Dispense: 30 tablet; Refill: 1  2. OSA (obstructive sleep apnea)  Encouraged to use her cpap regularly and continue with follow up with Neurology  3. ESRD on dialysis [facility])  Continue with dialysis   She has her labs done routinely  including kidney functions  4. Rash and nonspecific skin eruption - nystatin (NYSTATIN) powder; Apply 1 application topically 4 (four) times daily as needed.  Dispense: 30 g; Refill: 1   5. Essential hypertension  Blood pressure is elevated, she has been having her medications adjusted by nephrology/dialysis.  It is elevated when she does not have dialysis  I explained to her if she is not sleeping well and not using her CPAP regularly this can cause her blood pressure to go up   Patient was given opportunity to ask questions. Patient verbalized understanding of the plan and was able to repeat key elements of the plan. All questions were answered to their satisfaction.  [name], FNP   I, [name], FNP, have reviewed all documentation for this visit. The documentation on 08/16/20 for the exam, diagnosis, procedures, and orders are all accurate and complete.  THE PATIENT IS ENCOURAGED TO PRACTICE SOCIAL DISTANCING DUE TO THE COVID-19 PANDEMIC.

## 2020-08-03 NOTE — Patient Instructions (Signed)

## 2020-08-03 NOTE — Progress Notes (Signed)
This visit occurred during the SARS-CoV-2 public health emergency.  Safety protocols were in place, including screening questions prior to the visit, additional usage of staff PPE, and extensive cleaning of exam room while observing appropriate contact time as indicated for disinfecting solutions.  Subjective:   Whitney Walker is a 64 y.o. female who presents for Medicare Annual (Subsequent) preventive examination.  Review of Systems     Cardiac Risk Factors include: advanced age (>57mn, >>72women);hypertension;sedentary lifestyle;smoking/ tobacco exposure     Objective:    Today's Vitals   08/03/20 0928  BP: (!) 190/84  Pulse: 80  Temp: 98.5 F (36.9 C)  TempSrc: Oral  Weight: 200 lb (90.7 kg)  Height: 5' 1"  (1.549 m)   Body mass index is 37.79 kg/m.  Advanced Directives 08/03/2020 07/24/2020 05/18/2020 05/09/2020 05/08/2020 11/02/2019 10/22/2019  Does Patient Have a Medical Advance Directive? Yes No No No No No No  Type of [place] will - - - - - -  Copy of [place] Chart? No - copy requested - - - - - -  Would patient like information on creating a medical advance directive? - No - Patient declined - No - Patient declined No - Patient declined No - Patient declined No - Patient declined  Pre-existing out of facility DNR order (yellow form or pink MOST form) - - - - - - -    Current Medications (verified) Outpatient Encounter Medications as of 08/03/2020  Medication Sig  . acetaminophen (TYLENOL) 500 MG tablet Take 1,000 mg by mouth 2 (two) times daily as needed for moderate pain or headache.  . albuterol (PROVENTIL HFA;VENTOLIN HFA) 108 (90 Base) MCG/ACT inhaler Inhale 2 puffs into the lungs every 6 (six) hours as needed for wheezing or shortness of breath.   .[name] Kitchenapixaban (ELIQUIS) 2.5 MG TABS tablet Take 1 tablet (2.5 mg total) by mouth 2 (two) times daily.  . Biotin 10000 MCG TABS Take 10,000 mcg by mouth  daily.   . carvedilol (COREG) 12.5 MG tablet Take 12.5 mg by mouth See admin instructions. Takes Twice daily except Only takes once a day on dialysis days (holds in morning on Monday Wednesday and Friday)  . meclizine (ANTIVERT) 12.5 MG tablet Take 12.5 mg by mouth 3 (three) times daily as needed for dizziness.  . multivitamin (RENA-VIT) TABS tablet Take 1 tablet by mouth at bedtime.  . mupirocin ointment (BACTROBAN) 2 % Apply 1 application topically daily as needed (skin bumps).  . ondansetron (ZOFRAN ODT) 4 MG disintegrating tablet Take 1 tablet (4 mg total) by mouth every 6 (six) hours as needed. (Patient not taking: Reported on 08/03/2020)  . oxyCODONE-acetaminophen (PERCOCET/ROXICET) 5-325 MG tablet Take 2 tablets by mouth every 4 (four) hours as needed for severe pain. (Patient taking differently: Take 1 tablet by mouth every 4 (four) hours as needed for severe pain. )  . sevelamer carbonate (RENVELA) 800 MG tablet Take 3,200 mg by mouth 3 (three) times daily with meals. Take 4 tablets with each meal  . temazepam (RESTORIL) 15 MG capsule Take 1 capsule (15 mg total) by mouth at bedtime as needed for sleep. (Patient not taking: Reported on 08/03/2020)  . [DISCONTINUED] nystatin (NYSTATIN) powder Apply topically 4 (four) times daily. (Patient taking differently: Apply 1 application topically 4 (four) times daily as needed (irritation). )   No facility-administered encounter medications on file as of 08/03/2020.    Allergies (verified) Carnosine, Gadolinium derivatives, Iohexol, Iodine,  and Naltrexone   History: Past Medical History:  Diagnosis Date  . Anemia   . Anxiety   . CHF (congestive heart failure) ([place])   . Colon cancer [facility])    treatment surgery  . Complication of anesthesia    after first C- [place] "couldnt walk after", patient denies having a spinal  . COPD (chronic obstructive pulmonary disease) ([place])   . Coronary artery disease   . Depression   . DVT (deep venous thrombosis)  ([place])   . ESRD (end stage renal disease) ([place])    Hemo: MWF  . History of blood transfusion 04/2018  . Hypertension    07/07/18- no longer takes BP medications  . Meningitis   . Pain in limb 07/30/2013  . PE (pulmonary embolism)   . Peripheral vascular disease ([place])   . Restless legs   . Shortness of breath    with exertion  . Sleep apnea   . SOB (shortness of breath) 03/03/2019  . Vertigo    Past Surgical History:  Procedure Laterality Date  . ABDOMINAL AORTAGRAM N/A 07/26/2013   Procedure: ABDOMINAL Maxcine Ham;  Surgeon: [name] Diablo Grande, MD;  Location: [facility] CATH LAB;  Service: Cardiovascular;  Laterality: N/A;  . ABDOMINAL HYSTERECTOMY    . AV FISTULA PLACEMENT Left 05/05/2018   Procedure: ARTERIOVENOUS (AV) FISTULA CREATION BRACHIOCEPHALIC;  Surgeon: Waynetta Sandy, MD;  Location: [place];  Service: Vascular;  Laterality: Left;  . AV FISTULA PLACEMENT Left 07/16/2018   Procedure: ARTERIOVENOUS FISTULA CREATION;  Surgeon: Waynetta Sandy, MD;  Location: [place];  Service: Vascular;  Laterality: Left;  . BASCILIC VEIN TRANSPOSITION Left 09/03/2018   Procedure: BASILIC VEIN TRANSPOSITION SECOND STAGE;  Surgeon: Waynetta Sandy, MD;  Location: [place];  Service: Vascular;  Laterality: Left;  . BREAST BIOPSY Left   . CESAREAN SECTION     X 3 [id number]  . CHOLECYSTECTOMY    . CHOLECYSTECTOMY  1980/s  . COLECTOMY  2010  . DIVERTICULOSIS SURGERY-2002  2012  . FEMORAL-POPLITEAL BYPASS GRAFT Left 08/13/2013   Procedure: BYPASS GRAFT FEMORAL-POPLITEAL ARTERY WITH NON-REVERSED SAPHANEOUS VEIN; ULTRASOUND GUIDED;  Surgeon: Mal [name], MD;  Location: [place];  Service: Vascular;  Laterality: Left;  . INSERTION OF DIALYSIS CATHETER Right 04/30/2018   Procedure: INSERTION OF Right Internal Jugular DIALYSIS CATHETER;  Surgeon: [name], MD;  Location: [place];  Service: Vascular;  Laterality: Right;  . IR FLUORO GUIDE CV LINE RIGHT  07/24/2020  . IR REMOVAL TUN CV CATH W/O FL   07/21/2020  . IR US GUIDE VASC ACCESS RIGHT  07/24/2020  . LOWER EXTREMITY ANGIOGRAM Left 07/26/2013   Procedure: LOWER EXTREMITY ANGIOGRAM;  Surgeon: [name] Faulk, MD;  Location: [facility] CATH LAB;  Service: Cardiovascular;  Laterality: Left;   Family History  Problem Relation Age of Onset  . Cancer Mother 31       breast and bone  . Cancer Father 70       prostate  . Hypertension Sister   . Bleeding Disorder Sister   . Cancer Cousin 20       breast cancer   . Hypertension Daughter   . Breast cancer Neg Hx    Social History   Socioeconomic History  . Marital status: Single    Spouse name: Not on file  . Number of children: 4  . Years of education: 12th gd  . Highest education level: Not on file  Occupational History  . Occupation: N/A  Tobacco Use  .  Smoking status: Current Some Day Smoker    Packs/day: 0.00    Years: 40.00    Pack years: 0.00    Types: Cigarettes  . Smokeless tobacco: Former Systems developer  . Tobacco comment: has an occasional cigarette - mostly when around people  Vaping Use  . Vaping Use: Never used  Substance and Sexual Activity  . Alcohol use: No    Alcohol/week: 0.0 standard drinks    Comment: she used to drink alcohol, quit in 2010   . Drug use: Yes    Types: Oxycodone  . Sexual activity: Not Currently  Other Topics Concern  . Not on file  Social History Narrative   2 cups of coffee a day    Social Determinants of Health   Financial Resource Strain: Low Risk   . Difficulty of Paying Living Expenses: Not hard at all  Food Insecurity: No Food Insecurity  . Worried About Charity fundraiser in the Last Year: Never true  . Ran Out of Food in the Last Year: Never true  Transportation Needs: No Transportation Needs  . Lack of Transportation (Medical): No  . Lack of Transportation (Non-Medical): No  Physical Activity: Inactive  . Days of Exercise per Week: 0 days  . Minutes of Exercise per Session: 0 min  Stress: No Stress Concern Present  . Feeling of  Stress : Not at all  Social Connections:   . Frequency of Communication with Friends and Family:   . Frequency of Social Gatherings with Friends and Family:   . Attends Religious Services:   . Active Member of Clubs or Organizations:   . Attends Archivist Meetings:   [name] Marital Status:     Tobacco Counseling Ready to quit: Not Answered Counseling given: Not Answered Comment: has an occasional cigarette - mostly when around people   Clinical Intake:  Pre-visit preparation completed: Yes  Pain : No/denies pain     Nutritional Status: BMI > 30  Obese Nutritional Risks: None Diabetes: No  How often do you need to have someone help you when you read instructions, pamphlets, or other written materials from your doctor or pharmacy?: 1 - Never What is the last grade level you completed in school?: 12th grade  Diabetic? no  Interpreter Needed?: No  Information entered by :: NAllen LPN   Activities of Daily Living In your present state of health, do you have any difficulty performing the following activities: 08/03/2020 05/10/2020  Hearing? N -  Vision? Y -  Comment due to cataract -  Difficulty concentrating or making decisions? N -  Walking or climbing stairs? Y -  Dressing or bathing? N -  Doing errands, shopping? N N  Preparing Food and eating ? N -  Using the Toilet? N -  In the past six months, have you accidently leaked urine? N -  Do you have problems with loss of bowel control? N -  Managing your Medications? N -  Managing your Finances? N -  Housekeeping or managing your Housekeeping? N -  Some recent data might be hidden    Patient Care Team: [name], FNP as PCP - General ([place]) Waynetta Sandy, MD as Consulting Physician (Vascular Surgery) [facility] as Referring Physician (Nephrology) Edrick Oh, MD as Consulting Physician (Nephrology) Truitt Merle, MD as Consulting Physician  (Hematology) [facility] [place], RN as [place] Management [place], Tillie Rung as Social Worker [place],  Kennieth Francois, [facility] (Pharmacist)  Indicate any recent Medical Services you may have received from other than [facility] providers in the past year (date may be approximate).     Assessment:   This is a routine wellness examination for [place].  Hearing/Vision screen  Hearing Screening   125Hz  250Hz  500Hz  1000Hz  2000Hz  3000Hz  4000Hz  6000Hz  8000Hz   Right ear:           Left ear:           Vision Screening Comments: Regular eye exams,   Dietary issues and exercise activities discussed: Current Exercise Habits: The patient does not participate in regular exercise at present  Goals    .  "I am not really wearing my CPAP" (pt-stated)      Current Barriers:  [name] Knowledge Deficits related to disease process and Self Health Management for OSA  . Non-adherence to wearing CPAP as directed . Improper CPAP Face Mask  Nurse Case Manager Clinical Goal(s):  [name] Over the next 60 days, patient will work with the CCM team  to address needs related to Nasal Pillows for use of CPAP  not met . New 05/12/20- Over the next 45 days the patient will work with embedded care management team to obtain CPAP supplies including tubing and a nasal pillow Goal Met  . New 06/01/20- Over the next 60 days the patient will work with the  care management team to better manage OSA as evidenced by increased compliance of CPAP use  CCM SW Interventions: Completed 06/01/20 . Successful outbound call placed to the patient to assess goal progression . Determined the patient has received CPAP supplies via mail . Assessed for accuracy of order; patient stated "they sent everything I needed" . Advised the patient to contact care management team as needed with future equipment needs . Collaboration with RN Care Manager regarding goal progression   CCM RN CM Interventions:    05/18/20 call completed with patient  . Evaluation of current treatment plan related to OSA and patient's adherence to plan as established by provider . Determined patient received a call from the CPAP supplier regarding her replacement supplies; she will call the supplier back to confirm she needs supplies delivered to her home  . Discussed plans with patient for ongoing care management follow up and provided patient with direct contact information for care management team  Patient Self Care Activities:  . Self administers medications as prescribed . Attends all scheduled provider appointments . Calls pharmacy for medication refills . Attends church or other social activities . Performs ADL's independently . Performs IADL's independently . Calls provider office for new concerns or questions  Please see past updates related to this goal by clicking on the "Past Updates" button in the selected goal      .  "I didn't receive a call from the Eye Specialist" (pt-stated)      Current Barriers:  [name] Knowledge Deficits related to diagnosis and treatment of impaired visual acuity secondary to patient having a "film" over her right eye  Nurse Case Manager Clinical Goal(s):  [name] Over the next 45 days, patient will work with the Eye Specialist to address needs related to her impaired visual acuity secondary to having a "film" over her right eye  CCM RN CM Interventions:  11/03/19 inbound call completed with patient   . Evaluation of current treatment plan related to impaired visual acuity and patient's adherence to plan as established by provider. . Advised patient to contact the CCM RNCM  if she does not receive a call from Dr. [name] office regarding her new patient appointment by Monday, 11/08/19; Advised the initial referral was sent by PCP provider [name], FNP, in July; patient states she does not recall receiving a call from the [facility] Specialist and continues to have decreased visual  acuity secondary to having a "film" over the right eye . Collaborated with [name], FNP  regarding the need for a new referral for follow up with Dr. [name], Eye Specialist; advised the initial referral was sent in July, however the patient does not recall receiving a call from this provider regarding a new patient appointment . Discussed plans with patient for ongoing care management follow up and provided patient with direct contact information for care management team . Placed an outbound call to Dr. [name] office to confirm a new referral is needed, spoke with the scheduler who states this office was not seeing patient's in July due to [place] but that once she contacted the patient to schedule an appointment, the patient declined . Alerted PCP [name], FNP that a new referral is needed  Patient Self Care Activities:  . Self administers medications as prescribed . Attends all scheduled provider appointments . Calls pharmacy for medication refills . Performs ADL's independently . Performs IADL's independently . Calls provider office for new concerns or questions  Initial goal documentation     .  "I have been stumbling more frequently" (pt-stated)      Current Barriers:  [name] Knowledge Deficits related to evaluation and treatment of Gait disturbance and Impaired Physical Mobility  . Chronic Disease Management support and education needs related to ESRD, HTN, CHF  . ESRD on HD . Lacks needed caregiver assistance   Nurse Case Manager Clinical Goal(s):  [name] Over the next 30 days, patient will work with PCP to address needs related to evaluation and treatment of patient reported episodes of "stumbling" and impaired gait disturbance  Goal Met  . New 05/08/20 Over the next 90 days, patient will work with the [place] CM and PCP on disease education and support to help improve [place] related to Impaired Mobility and Gait Disturbance with falls   CCM SW  Interventions: Completed 06/26/20 . SW received inbound call from the patient whom reports rollator delivered over the weekend o Sister and brother in law plan to assist with assembly . Encouraged the patient to contact SW with future care coordination needs . Next RN outreach call planned for 06/29/20  CCM RN CM Interventions:  05/18/20 call completed with patient  . Evaluation of current treatment plan related to Impaired gait with recent fall and ED visit and patient's adherence to plan as established by provider . Determined patient received a PT evaluation while IP after being readmitted for Hyperkalemia . Discussed she will resume the HEP as directed by the IP PT and will not resume in home PT at this time . Determined patient has not fallen since last contact and was encouraged by her Hematologist to be more active due to continued risk for DVT . Determined patient would like to request an Rx for a rollator walker to assist with ability to be more mobile w/o falls . Sent in basket message to PCP provider [name], FNP requesting an Rx for a rollator walker be faxed to [place], advised Janece Ms. Rosasco will not start in home PT at this time but does plan to do her daily stretches as directed by IP PT .  Discussed plans with patient for ongoing care management follow up and provided patient with direct contact information for care management team  Patient Self Care Activities:  . Self administers medications as prescribed . Attends all scheduled provider appointments . Calls pharmacy for medication refills . Calls provider office for new concerns or questions  Please see past updates related to this goal by clicking on the "Past Updates" button in the selected goal      .  "My dialysis treatments are not going very good" (pt-stated)      Current Barriers:  [name] Knowledge Deficits related to ESRD . Dysfunctional dialysis access  Nurse Case Manager Clinical Goal(s):  [name] Over the  next 90 days, patient will work with the [place] to address needs related to ESRD for disease education related to hemodialysis treatment, fluid balance, dietary concerns and hemodialysis access  CCM RN CM Interventions:  05/11/19 inbound call completed with patient . Evaluation of current treatment plan related to ESRD and patient's adherence to plan as established by provider . Determined patient is still IP at [facility], admitted on 05/08/20 for Acute respiratory failure with hypoxia and with a K+ 7.5 . Reinforced the importance of keeping all HD treatment appointments due to risk of having Pulmonary edema and or Hyperkalemia with missed treatments, stressed this could lead to death . Determined patient received a HD treatment yesterday on 05/09/20 and will be d/c today, she will resume her regular HD appointments at the [facility], resuming a Monday, Wednesday, Friday schedule . Discussed plans with patient for ongoing care management follow up and provided patient with direct contact information for care management team  Patient Self Care Activities:  . Self administers medications as prescribed . Attends all scheduled provider appointments . Calls pharmacy for medication refills . Attends church or other social activities . Performs ADL's independently . Performs IADL's independently . Calls provider office for new concerns or questions   Please see past updates related to this goal by clicking on the "Past Updates" button in the selected goal      .  Medication Management      CARE PLAN ENTRY  Medication management . Pharmacist Clinical Goal(s): o Over the next 90 days, patient will work with PharmD and providers to achieve optimal medication adherence . [facility] . Interventions o Comprehensive medication review performed. o Utilize [facility] for medication synchronization, packaging and  delivery . Patient self care activities - Over the next 90 days, patient will: o Focus on medication adherence by utilizing adherence packaging and medication synchronization o Take medications as prescribed o Report any questions or concerns to PharmD and/or provider(s)  Initial goal documentation     .  Patient Stated      No goals    .  Patient Stated      08/03/2020, no goals    .  Pharmacy Care Plan      CARE PLAN ENTRY (see longitudinal plan of care for additional care plan information)  Current Barriers:  . Chronic Disease Management support, education, and care coordination needs related to Hypertension, Hyperlipidemia, and Chronic Kidney Disease   Hypertension BP Readings from Last 3 Encounters:  05/18/20 (!) 149/81  05/16/20 (!) 174/83  05/10/20 (!) 171/86   . Pharmacist Clinical Goal(s): o Over the next 180 days, patient will work with PharmD and providers to achieve BP goal <130/80 . Current regimen:  o Carvedilol 12.'5mg'$  twice daily (  once daily on dialysis days) . Interventions: o Provided dietary and exercise recommendations o Consider additional blood pressure lowering medication if blood pressure remains elevated. Would need to consult dialysis before adding additional medication. . Patient self care activities - Over the next 180 days, patient will: o Check BP 2-3 times daily, document, and provide at future appointments o Ensure daily salt intake < 2300 mg/day o Try to exercise for 30 minutes 5 times weekly  Hyperlipidemia Lab Results  Component Value Date/Time   LDLCALC 97 06/04/2018 12:00 AM   . Pharmacist Clinical Goal(s): o Over the next 120 days, patient will work with PharmD and providers to achieve LDL goal < 70 . Current regimen:  o N/A . Interventions: o Determine why atorvastatin was discontinued o Discuss starting atorvastatin or another cholesterol medication with PCP and patient at follow up . Patient self care activities - Over the next  120 days, patient will: o Focus on eating healthy and exercising  Chronic Kidney Disease on Dialysis . Pharmacist Clinical Goal(s) o Over the next 90 days, patient will work with PharmD and providers to minimize complications as a result of dialysis . Current regimen:  o Nephro Carb steady three times weekly during dialysis o Renvela 860m 4 tablets 3 times daily with each meal . Interventions: o Recommend patient follow up with neurologist regarding complaint of losing memory from dialysis o Collaboration with PCP regarding patient's request to obtain bandages. Determined we are unable to obtain requested bandages, patient will have to get from dialysis center . Patient self care activities - Over the next 90 days, patient will: o Consider neurologist follow up o Obtain bandages from current dialysis center  Medication management . Pharmacist Clinical Goal(s): o Over the next 90 days, patient will work with PharmD and providers to achieve optimal medication adherence . Current pharmacy: [facility] . Interventions o Comprehensive medication review performed. o Utilize [facility] pharmacy for medication synchronization, packaging and delivery . Patient self care activities - Over the next 90 days, patient will: o Focus on medication adherence by using adherence packaging and medication delivery services o Take medications as prescribed o Report any questions or concerns to PharmD and/or provider(s)  Initial goal documentation       Depression Screen PHQ 2/9 Scores 08/03/2020 01/04/2020 12/07/2019 07/20/2019 06/10/2019 04/15/2019 10/29/2018  PHQ - 2 Score 0 0 0 2 3 1  0  PHQ- 9 Score - - - 9 10 - -    Fall Risk Fall Risk  08/03/2020 01/04/2020 12/07/2019 07/20/2019 06/10/2019  Falls in the past year? 1 1 0 1 0  Comment lost balance - - - -  Number falls in past yr: 1 0 - 1 -  Injury with Fall? 0 0 - 0 -  Risk for fall due to : History of fall(s);Medication side effect - - - Medication  side effect  Follow up Falls evaluation completed;Education provided;Falls prevention discussed - - - Falls prevention discussed;Education provided    Any stairs in or around the home? No  If so, are there any without handrails? n/a Home free of loose throw rugs in walkways, pet beds, electrical cords, etc? Yes  Adequate lighting in your home to reduce risk of falls? Yes   ASSISTIVE DEVICES UTILIZED TO PREVENT FALLS:  Life alert? No  Use of a cane, walker or w/c? No  Grab bars in the bathroom? Yes  Shower chair or bench in shower? Yes  Elevated toilet seat or a handicapped toilet? No  TIMED UP AND GO:  Was the test performed? No .    Gait slow and steady with assistive device  Cognitive Function: MMSE - Mini Mental State Exam 03/19/2017 07/31/2016 02/05/2016 05/04/2015  Orientation to time 4 5 5 5   Orientation to Place 4 5 5 4   Registration 3 3 3 3   Attention/ Calculation 1 4 4 4   Recall 1 2 2 3   Language- name 2 objects 2 2 2 2   Language- repeat 1 1 1 1   Language- follow 3 step command 3 3 3 2   Language- read & follow direction 1 1 1 1   Write a sentence 1 1 1 1   Copy design 1 1 1 1   Total score 22 28 28 27      6CIT Screen 06/10/2019  What Year? 0 points  What month? 0 points  What time? 0 points  Count back from 20 0 points  Months in reverse 0 points  Repeat phrase 0 points  Total Score 0    Immunizations Immunization History  Administered Date(s) Administered  . Fluad Quad(high Dose 65+) 10/04/2019  . Moderna SARS-COVID-2 Vaccination 03/10/2020, 04/07/2020    TDAP status: Due, Education has been provided regarding the importance of this vaccine. Advised may receive this vaccine at local pharmacy or Health Dept. Aware to provide a copy of the vaccination record if obtained from local pharmacy or Health Dept. Verbalized acceptance and understanding. Flu Vaccine status: Up to date Pneumococcal vaccine status: Declines Prevnar 13 Covid-19 vaccine status: Completed  vaccines  Qualifies for Shingles Vaccine? Yes   Zostavax completed No   Shingrix Completed?: No.    Education has been provided regarding the importance of this vaccine. Patient has been advised to call insurance company to determine out of pocket expense if they have not yet received this vaccine. Advised may also receive vaccine at local pharmacy or Health Dept. Verbalized acceptance and understanding.  Screening Tests Health Maintenance  Topic Date Due  . PAP SMEAR-Modifier  Never done  . INFLUENZA VACCINE  07/30/2020  . DEXA SCAN  08/03/2021 (Originally 06/29/2020)  . TETANUS/TDAP  08/03/2021 (Originally 06/29/1974)  . PNA vac Low Risk Adult (1 of 2 - PCV13) 08/03/2021 (Originally 06/29/2020)  . MAMMOGRAM  01/24/2022  . COLONOSCOPY  06/05/2022  . COVID-19 Vaccine  Completed  . Hepatitis C Screening  Completed  . HIV Screening  Completed    Health Maintenance  Health Maintenance Due  Topic Date Due  . PAP SMEAR-Modifier  Never done  . INFLUENZA VACCINE  07/30/2020    Colorectal cancer screening: Completed 07/24/2017. Repeat every 5 years Mammogram status: Completed 01/25/2020. Repeat every year Bone Density status: Declined  Lung Cancer Screening: (Low Dose CT Chest recommended if Age 40-80 years, 30 pack-year currently smoking OR have quit w/in 15years.) does not qualify.   Lung Cancer Screening Referral: no  Additional Screening:  Hepatitis C Screening: does qualify; Completed 05/28/2012  Vision Screening: Recommended annual ophthalmology exams for early detection of glaucoma and other disorders of the eye. Is the patient up to date with their annual eye exam?  Yes  Who is the provider or what is the name of the office in which the patient attends annual eye exams? Does not remember If pt is not established with a provider, would they like to be referred to a provider to establish care? No .   Dental Screening: Recommended annual dental exams for proper oral  hygiene  Community Resource Referral / Chronic Care Management: CRR required  this visit?  No   CCM required this visit?  No      Plan:     I have personally reviewed and noted the following in the patient's chart:   . Medical and social history . Use of alcohol, tobacco or illicit drugs  . Current medications and supplements . Functional ability and status . Nutritional status . Physical activity . Advanced directives . List of other physicians . Hospitalizations, surgeries, and ER visits in previous 12 months . Vitals . Screenings to include cognitive, depression, and falls . Referrals and appointments  In addition, I have reviewed and discussed with patient certain preventive protocols, quality metrics, and best practice recommendations. A written personalized care plan for preventive services as well as general preventive health recommendations were provided to patient.     Kellie Simmering, LPN   03/07/8827   Nurse Notes: 6 CIT not performed. Patient is cognitive per direct observation.

## 2020-08-04 ENCOUNTER — Telehealth: Payer: Self-pay

## 2020-08-04 DIAGNOSIS — A498 Other bacterial infections of unspecified site: Secondary | ICD-10-CM | POA: Diagnosis not present

## 2020-08-04 DIAGNOSIS — D631 Anemia in chronic kidney disease: Secondary | ICD-10-CM | POA: Diagnosis not present

## 2020-08-04 DIAGNOSIS — N186 End stage renal disease: Secondary | ICD-10-CM | POA: Diagnosis not present

## 2020-08-04 DIAGNOSIS — Z992 Dependence on renal dialysis: Secondary | ICD-10-CM | POA: Diagnosis not present

## 2020-08-04 DIAGNOSIS — D689 Coagulation defect, unspecified: Secondary | ICD-10-CM | POA: Diagnosis not present

## 2020-08-04 DIAGNOSIS — D509 Iron deficiency anemia, unspecified: Secondary | ICD-10-CM | POA: Diagnosis not present

## 2020-08-04 DIAGNOSIS — N2581 Secondary hyperparathyroidism of renal origin: Secondary | ICD-10-CM | POA: Diagnosis not present

## 2020-08-04 NOTE — Chronic Care Management (AMB) (Signed)
Chronic Care Management Pharmacy Assistant   Name: Whitney Walker  MRN: [id number] DOB: 04-09-1956  Reason for Encounter: Medication Review/ Follow up  PCP : [name], FNP  Allergies:   Allergies  Allergen Reactions  . Carnosine     Other reaction(s): Unknown  . Gadolinium Derivatives Hives and Other (See Comments)    HIVES, Desc: HIVES W/ "DYE" USED FOR 1ST CT SCAN BUT NOT 2ND, NO PREMEDS USED, PT UNCERTAIN OF CIRCUMSTANCES,,?POSSIBLE MRI CONTRAST ALLERGY, ALL STUDIES DONE "SOMEWHERE" IN [place]//A.C., Onset Date: [id number]  . Iohexol Other (See Comments)     Code: HIVES, Desc: HIVES W/ "DYE" USED FOR 1ST CT SCAN BUT NOT 2ND, NO PREMEDS USED, PT UNCERTAIN OF CIRCUMSTANCES,,?POSSIBLE MRI CONTRAST ALLERGY, ALL STUDIES DONE "SOMEWHERE" IN [place]//A.C., Onset Date: [id number]   . Iodine Hives  . Naltrexone     Other reaction(s): Unknown    Medications: Outpatient Encounter Medications as of 08/04/2020  Medication Sig  . acetaminophen (TYLENOL) 500 MG tablet Take 1,000 mg by mouth 2 (two) times daily as needed for moderate pain or headache.  . albuterol (VENTOLIN HFA) 108 (90 Base) MCG/ACT inhaler Inhale 2 puffs into the lungs every 6 (six) hours as needed for wheezing or shortness of breath.  [name] apixaban (ELIQUIS) 2.5 MG TABS tablet Take 1 tablet (2.5 mg total) by mouth 2 (two) times daily.  . Ascorbic Acid (VITAMIN C) 100 MG tablet Take 100 mg by mouth daily.  . Biotin 10000 MCG TABS Take 10,000 mcg by mouth daily.   . carvedilol (COREG) 12.5 MG tablet Take 12.5 mg by mouth See admin instructions. Takes Twice daily except Only takes once a day on dialysis days (holds in morning on Monday Wednesday and Friday)  . eszopiclone (LUNESTA) 2 MG TABS tablet Take 1 tablet (2 mg total) by mouth at bedtime as needed for sleep. Take immediately before bedtime  . meclizine (ANTIVERT) 12.5 MG tablet Take 1 tablet (12.5 mg total) by mouth 3 (three) times daily as needed for  dizziness.  . multivitamin (RENA-VIT) TABS tablet Take 1 tablet by mouth at bedtime.  . mupirocin ointment (BACTROBAN) 2 % Apply 1 application topically daily as needed (skin bumps).  . nystatin (NYSTATIN) powder Apply 1 application topically 4 (four) times daily as needed.  . ondansetron (ZOFRAN ODT) 4 MG disintegrating tablet Take 1 tablet (4 mg total) by mouth every 6 (six) hours as needed. (Patient not taking: Reported on 08/03/2020)  . oxyCODONE-acetaminophen (PERCOCET/ROXICET) 5-325 MG tablet Take 2 tablets by mouth every 4 (four) hours as needed for severe pain. (Patient taking differently: Take 1 tablet by mouth every 4 (four) hours as needed for severe pain. )  . sevelamer carbonate (RENVELA) 800 MG tablet Take 3,200 mg by mouth 3 (three) times daily with meals. Take 4 tablets with each meal  . temazepam (RESTORIL) 15 MG capsule Take 1 capsule (15 mg total) by mouth at bedtime as needed for sleep. (Patient not taking: Reported on 08/03/2020)  . vitamin E (VITAMIN E) 200 UNIT capsule Take 200 Units by mouth daily.   No facility-administered encounter medications on file as of 08/04/2020.    Current Diagnosis: Patient Active Problem List   Diagnosis Date Noted  . Hyperkalemia 05/09/2020  . Acute respiratory failure with hypoxia ([place]) 05/09/2020  . Left leg pain 05/09/2020  . Restless leg syndrome 09/19/2018  . OSA (obstructive sleep apnea) 04/29/2018  . ESRD on dialysis ([place]) 04/29/2018  . Dyspnea on exertion 03/12/2017  .  Morbid (severe) obesity due to excess calories ([place]) 03/12/2017  . Anemia of chronic kidney failure, stage 3 (moderate) 12/10/2016  . History of colon cancer 08/12/2016  . Mass of left breast on mammogram 12/10/2015  . HTN (hypertension) 10/18/2015  . Depression 10/18/2015  . Numbness and tingling of left arm and leg 01/24/2015  . PVD (peripheral vascular disease) with claudication ([place]) 03/23/2014  . PAD (peripheral artery disease) ([place]) 12/14/2013  . Chronic  diastolic heart failure ([place]) 11/15/2013  . Essential hypertension 11/15/2013  . Leucocytosis 04/28/2012     Follow-Up:  Care Coordination with Outside Provider- Patient has requested Percocet to be filled, will speak with patient about coordinating refill with Nephrologist. [facility].   08/04/2020- Patient did return call that day, she was at dialysis, was unable to talk but did mention she had a complaint and that she would call me back.  08/08/2020- Patient did not return call, she did have an appt with Pharmacist Jannette Fogo on 08/08/2020.  [name], [place] Clinical Pharmacist Assistant [place], [place] Pharmacist Assistant [phone]

## 2020-08-07 DIAGNOSIS — D509 Iron deficiency anemia, unspecified: Secondary | ICD-10-CM | POA: Diagnosis not present

## 2020-08-07 DIAGNOSIS — N2581 Secondary hyperparathyroidism of renal origin: Secondary | ICD-10-CM | POA: Diagnosis not present

## 2020-08-07 DIAGNOSIS — A498 Other bacterial infections of unspecified site: Secondary | ICD-10-CM | POA: Diagnosis not present

## 2020-08-07 DIAGNOSIS — D631 Anemia in chronic kidney disease: Secondary | ICD-10-CM | POA: Diagnosis not present

## 2020-08-07 DIAGNOSIS — D689 Coagulation defect, unspecified: Secondary | ICD-10-CM | POA: Diagnosis not present

## 2020-08-07 DIAGNOSIS — N186 End stage renal disease: Secondary | ICD-10-CM | POA: Diagnosis not present

## 2020-08-07 DIAGNOSIS — Z992 Dependence on renal dialysis: Secondary | ICD-10-CM | POA: Diagnosis not present

## 2020-08-08 ENCOUNTER — Ambulatory Visit (INDEPENDENT_AMBULATORY_CARE_PROVIDER_SITE_OTHER): Payer: Medicare Other

## 2020-08-08 ENCOUNTER — Other Ambulatory Visit: Payer: Self-pay

## 2020-08-08 DIAGNOSIS — I5032 Chronic diastolic (congestive) heart failure: Secondary | ICD-10-CM | POA: Diagnosis not present

## 2020-08-08 DIAGNOSIS — N186 End stage renal disease: Secondary | ICD-10-CM | POA: Diagnosis not present

## 2020-08-08 DIAGNOSIS — I10 Essential (primary) hypertension: Secondary | ICD-10-CM | POA: Diagnosis not present

## 2020-08-08 NOTE — Chronic Care Management (AMB) (Signed)
Chronic Care Management Pharmacy  Name: Whitney Walker  MRN: [id number] DOB: 08-10-1956  Chief Complaint/ HPI  Whitney Walker,  64 y.o. , female presents for their Follow-Up CCM visit with the clinical pharmacist via telephone due to COVID-19 Pandemic.  PCP : [name], FNP  Their chronic conditions include: Chronic diastolic CHF, Hypertension, CKD  Office Visits: 08/03/20 AWV and OV: Pt states that temazepam is not working for her insomnia. Pt also mentioned BP being elevated at dialysis and problems with catheter site. Pt started on Lunesta 2mg  nightly for insomnia.   05/16/20 Televisit: Presented for hospital follow up after fall and sprained ankle. Pt would like transparent dressing for catheter after dialysis treatments.   05/04/20 OV: Presented for hypertension. BP today was normal. Continue current medications. Pt reported trazodone ineffective for insomnia. Started on Temazepam 15mg  daily. Follow up in June.   01/04/20 OV: Presented for evaluation of insomnia. States that she has been able to fall asleep earlier. She takes trazodone 3-4 times per week as needed, but experiences restless legs when she takes it. Increase to 2 tablets daily.  Consult Visits: 05/18/20 Oncology OV w/ Dr. Burr Medico: Presented for follow up for anemia and history of thrombosis. Pt reported she was not doing well due to recent fall (5/9) and flare of sciatic pain. Reported difficulty breathing on 5/10 and has decreased urine output. Continue Eliquis 2.5mg  BID (dosed based on ESRD and HD). Pt takes oral and IV iron supplements as needed, managed by dialysis center. Also receiving EPO during dialysis. Follow up in 1 year.   05/08/20-05/10/20 ED admission: Presented with acute respirator failure with hypoxia and hyperkalemia likely related to skipped dialysis day. Xray showed cardiomegaly and small bilateral pleural effusions. Home health PT/OT recommended for left leg/ankle pain post fall. Follow up with PCP  and continue with regular dialysis schedule.  05/07/20 ED visit: Presented with ankle and knee pain after fall. Advise rest, ice, and elevation for possible ankle sprain. Given Norco for a couple of days. Follow up with PCP.   CCM Encounters: 06/26/20 SW: Pt called to notify rollator delivered over the weekend  06/07/20 SW: Call with [facility] to determine rollators are currently out of stock. They will contact pt when they are available again.   06/01/20 SW: Confirmed CPAP supplies delivered. Pt not yet received rollator. Collaboration with PCP to resend orders to [facility]. Pt in contact with [facility] regarding caregiver assistance.   05/19/20 SW: Care coordination regarding DME supplies for CPAP and transportation services.   05/17/20 RN: Pt requested rollator walker to assist with ambulation. Pt does not wish to resume in home PT at this time but will continue the HEP as directed by IP PT.   05/08/20 RN: Referred pt to PharmD for transferring Rxs to a pharmacy that can deliver medications. Also collaborated with SW.   Medications: Outpatient Encounter Medications as of 08/08/2020  Medication Sig  . acetaminophen (TYLENOL) 500 MG tablet Take 1,000 mg by mouth 2 (two) times daily as needed for moderate pain or headache.  . albuterol (VENTOLIN HFA) 108 (90 Base) MCG/ACT inhaler Inhale 2 puffs into the lungs every 6 (six) hours as needed for wheezing or shortness of breath.  [name] apixaban (ELIQUIS) 2.5 MG TABS tablet Take 1 tablet (2.5 mg total) by mouth 2 (two) times daily.  . Ascorbic Acid (VITAMIN C) 100 MG tablet Take 100 mg by mouth daily.  . Biotin 10000 MCG TABS Take 10,000 mcg  by mouth daily.   . carvedilol (COREG) 12.5 MG tablet Take 12.5 mg by mouth See admin instructions. Takes Twice daily except Only takes once a day on dialysis days (holds in morning on Monday Wednesday and Friday)  . eszopiclone (LUNESTA) 2 MG TABS tablet Take 1 tablet (2 mg total) by mouth at bedtime as  needed for sleep. Take immediately before bedtime  . meclizine (ANTIVERT) 12.5 MG tablet Take 1 tablet (12.5 mg total) by mouth 3 (three) times daily as needed for dizziness.  . multivitamin (RENA-VIT) TABS tablet Take 1 tablet by mouth at bedtime.  . mupirocin ointment (BACTROBAN) 2 % Apply 1 application topically daily as needed (skin bumps).  . nystatin (NYSTATIN) powder Apply 1 application topically 4 (four) times daily as needed.  . ondansetron (ZOFRAN ODT) 4 MG disintegrating tablet Take 1 tablet (4 mg total) by mouth every 6 (six) hours as needed. (Patient not taking: Reported on 08/03/2020)  . oxyCODONE-acetaminophen (PERCOCET/ROXICET) 5-325 MG tablet Take 2 tablets by mouth every 4 (four) hours as needed for severe pain. (Patient taking differently: Take 1 tablet by mouth every 4 (four) hours as needed for severe pain. )  . sevelamer carbonate (RENVELA) 800 MG tablet Take 3,200 mg by mouth 3 (three) times daily with meals. Take 4 tablets with each meal  . temazepam (RESTORIL) 15 MG capsule Take 1 capsule (15 mg total) by mouth at bedtime as needed for sleep. (Patient not taking: Reported on 08/03/2020)  . vitamin E (VITAMIN E) 200 UNIT capsule Take 200 Units by mouth daily.   No facility-administered encounter medications on file as of 08/08/2020.    Current Diagnosis/Assessment:    Goals Addressed   None    Heart Failure   Type: Diastolic  Last ejection fraction: 65-70% 11/14 NYHA Class: II (slight limitation of activity)  Patient has failed these meds in past: Bystolic, labetalol, HCTZ Patient is currently controlled on the following medications:   Carvedilol 12.5mg  twice daily (once daily on dialysis days)  We discussed:  Weighing daily  Plan Continue current medications  Hypertension   Office blood pressures are  BP Readings from Last 3 Encounters:  08/03/20 (!) 190/84  08/03/20 (!) 190/84  07/24/20 (!) 193/90   Patient has failed these meds in the past:  Amlodipine Patient is currently uncontrolled on the following medications:   Carvedilol 12.5mg  twice daily (only takes once daily on dialysis days)  Patient checks BP at home twice daily to three times daily  Patient home BP readings are ranging: 170-194/ 90-100  We discussed:  Pt states BP is always high when she goes to dialysis  Pt states BP is high at home too  Bystolic gave her headaches, thinks amlodipine gave her headaches as well  Recommend limit salt intake  Plan Continue current medications  Have patient come into office for a visit with PCP to assess BP  History of DVT/PE   Patient has failed these meds in past: Xarelto, warfarin Patient is currently controlled on the following medications:   Eliquis 2.5mg  twice daily  We discussed:    Denies bruising or bleeding  Plan Continue current medications  Hyperlipidemia   Lipid Panel     Component Value Date/Time   CHOL 191 06/04/2018 0000   TRIG 155 06/04/2018 0000   HDL 63 06/04/2018 0000   [place] 97 06/04/2018 0000     The 10-year ASCVD risk score Mikey Bussing DC Jr., et al., 2013) is: 37.2%*   Values used to calculate  the score:     Age: 32 years     Sex: Female     Is Non-Hispanic African American: Yes     Diabetic: No     Tobacco smoker: Yes     Systolic Blood Pressure: 354 mmHg     Is BP treated: Yes     HDL Cholesterol: 63 mg/dL*     Total Cholesterol: 191 mg/dL*     * - Cholesterol units were assumed for this score calculation   Patient has failed these meds in past: Atorvastatin Patient is currently uncontrolled on the following medications:  . N/A  We discussed:  Diet and exercise extensively  Pt says she does not eat a lot of red meat  Plan Continue current medications  Discuss adding cholesterol medication with PCP and with pt at follow up visit due to LDL > 70.   Insomnia   Patient has failed these meds in past: Temazepam  Patient is currently uncontrolled on the following  medications:   Eszopiclone 2mg  nightly  We discussed:    Started Lunesta a few days ago, has not noticed a big difference yet  Plan Continue current medications  Dialysis   Patient is currently on the following medications:   Nephro Carb steady during dialysis  Renvela 800mg  4 tablets 3 times daily with each meal  We discussed:    Pt was originally complaining of pain at catheter site, but states this has subsided some  Pt does mention a rash at catheter site and has showed dialysis nurse  Advised pt that PCP could prescribe Lidocaine gel  Plan Continue current medications   Health Maintenance   Patient is currently on the following medications:  . Acetaminophen 500mg  2 tablets twice daily as needed . Albuterol 2 puffs every 6 hours as needed for wheezing or SOB . Vitamin C 100mg  daily . Biotin 1070mcg daily . Ondansetron 4mg  ODT . Vitamin E 200 units daily  Plan Continue current medications  Vaccines   Reviewed and discussed patient's vaccination history. No [facility] records.  Immunization History  Administered Date(s) Administered  . Fluad Quad(high Dose 65+) 10/04/2019  . Moderna SARS-COVID-2 Vaccination 03/10/2020, 04/07/2020   We discussed:  Pt does not want pneumonia and shingles shot  Does not want to discuss vaccines at all  Advised pt that I was available to answer any questions or address concerns pt may have about vaccines  Plan Pt unwilling to discuss vaccine recommendations at this time  Medication Management   Pt uses [facility] for all medications Medication is delivered in adherence packaging Pt endorses 100% compliance  We discussed:   Importance of taking all medications daily as prescribed   Plan Utilize [facility] pharmacy for medication synchronization, packaging and delivery  Follow up: 3 month phone visit  Jannette Fogo, PharmD Clinical Pharmacist [facility] [phone]

## 2020-08-09 DIAGNOSIS — N186 End stage renal disease: Secondary | ICD-10-CM | POA: Diagnosis not present

## 2020-08-09 DIAGNOSIS — D631 Anemia in chronic kidney disease: Secondary | ICD-10-CM | POA: Diagnosis not present

## 2020-08-09 DIAGNOSIS — Z992 Dependence on renal dialysis: Secondary | ICD-10-CM | POA: Diagnosis not present

## 2020-08-09 DIAGNOSIS — D509 Iron deficiency anemia, unspecified: Secondary | ICD-10-CM | POA: Diagnosis not present

## 2020-08-09 DIAGNOSIS — A498 Other bacterial infections of unspecified site: Secondary | ICD-10-CM | POA: Diagnosis not present

## 2020-08-09 DIAGNOSIS — N2581 Secondary hyperparathyroidism of renal origin: Secondary | ICD-10-CM | POA: Diagnosis not present

## 2020-08-09 DIAGNOSIS — D689 Coagulation defect, unspecified: Secondary | ICD-10-CM | POA: Diagnosis not present

## 2020-08-10 ENCOUNTER — Telehealth: Payer: Medicare Other

## 2020-08-10 ENCOUNTER — Other Ambulatory Visit: Payer: Self-pay

## 2020-08-10 ENCOUNTER — Ambulatory Visit: Payer: Self-pay

## 2020-08-10 DIAGNOSIS — I10 Essential (primary) hypertension: Secondary | ICD-10-CM | POA: Diagnosis not present

## 2020-08-10 DIAGNOSIS — N186 End stage renal disease: Secondary | ICD-10-CM

## 2020-08-10 DIAGNOSIS — I5032 Chronic diastolic (congestive) heart failure: Secondary | ICD-10-CM | POA: Diagnosis not present

## 2020-08-11 DIAGNOSIS — Z992 Dependence on renal dialysis: Secondary | ICD-10-CM | POA: Diagnosis not present

## 2020-08-11 DIAGNOSIS — D509 Iron deficiency anemia, unspecified: Secondary | ICD-10-CM | POA: Diagnosis not present

## 2020-08-11 DIAGNOSIS — A498 Other bacterial infections of unspecified site: Secondary | ICD-10-CM | POA: Diagnosis not present

## 2020-08-11 DIAGNOSIS — N186 End stage renal disease: Secondary | ICD-10-CM | POA: Diagnosis not present

## 2020-08-11 DIAGNOSIS — D631 Anemia in chronic kidney disease: Secondary | ICD-10-CM | POA: Diagnosis not present

## 2020-08-11 DIAGNOSIS — N2581 Secondary hyperparathyroidism of renal origin: Secondary | ICD-10-CM | POA: Diagnosis not present

## 2020-08-11 DIAGNOSIS — D689 Coagulation defect, unspecified: Secondary | ICD-10-CM | POA: Diagnosis not present

## 2020-08-14 DIAGNOSIS — D509 Iron deficiency anemia, unspecified: Secondary | ICD-10-CM | POA: Diagnosis not present

## 2020-08-14 DIAGNOSIS — D689 Coagulation defect, unspecified: Secondary | ICD-10-CM | POA: Diagnosis not present

## 2020-08-14 DIAGNOSIS — A498 Other bacterial infections of unspecified site: Secondary | ICD-10-CM | POA: Diagnosis not present

## 2020-08-14 DIAGNOSIS — N186 End stage renal disease: Secondary | ICD-10-CM | POA: Diagnosis not present

## 2020-08-14 DIAGNOSIS — D631 Anemia in chronic kidney disease: Secondary | ICD-10-CM | POA: Diagnosis not present

## 2020-08-14 DIAGNOSIS — N2581 Secondary hyperparathyroidism of renal origin: Secondary | ICD-10-CM | POA: Diagnosis not present

## 2020-08-14 DIAGNOSIS — Z992 Dependence on renal dialysis: Secondary | ICD-10-CM | POA: Diagnosis not present

## 2020-08-15 NOTE — Chronic Care Management (AMB) (Signed)
Chronic Care Management   Follow Up Note   08/10/2020 Name: Whitney Walker MRN: [id number] DOB: July 13, 1956  Referred by: [name], FNP Reason for referral : Chronic Care Management (FU RN CM Call )   Whitney Walker is a 64 y.o. year old female who is a primary care patient of [name], [place]. The CCM team was consulted for assistance with chronic disease management and care coordination needs.    Review of patient status, including review of consultants reports, relevant laboratory and other test results, and collaboration with appropriate care team members and the patient's provider was performed as part of comprehensive patient evaluation and provision of chronic care management services.    SDOH (Social Determinants of Health) assessments performed: Yes - no acute challenges identified at this time  See Care Plan activities for detailed interventions related to [name])   Placed CCM RN CM outbound call to patient for a care plan update.    Outpatient Encounter Medications as of 08/10/2020  Medication Sig  . acetaminophen (TYLENOL) 500 MG tablet Take 1,000 mg by mouth 2 (two) times daily as needed for moderate pain or headache.  . albuterol (VENTOLIN HFA) 108 (90 Base) MCG/ACT inhaler Inhale 2 puffs into the lungs every 6 (six) hours as needed for wheezing or shortness of breath.  [name] apixaban (ELIQUIS) 2.5 MG TABS tablet Take 1 tablet (2.5 mg total) by mouth 2 (two) times daily.  . Ascorbic Acid (VITAMIN C) 100 MG tablet Take 100 mg by mouth daily.  . Biotin 10000 MCG TABS Take 10,000 mcg by mouth daily.   . carvedilol (COREG) 12.5 MG tablet Take 12.5 mg by mouth See admin instructions. Takes Twice daily except Only takes once a day on dialysis days (holds in morning on Monday Wednesday and Friday)  . eszopiclone (LUNESTA) 2 MG TABS tablet Take 1 tablet (2 mg total) by mouth at bedtime as needed for sleep. Take immediately before bedtime  . meclizine (ANTIVERT) 12.5 MG  tablet Take 1 tablet (12.5 mg total) by mouth 3 (three) times daily as needed for dizziness.  . multivitamin (RENA-VIT) TABS tablet Take 1 tablet by mouth at bedtime.  . mupirocin ointment (BACTROBAN) 2 % Apply 1 application topically daily as needed (skin bumps).  . nystatin (NYSTATIN) powder Apply 1 application topically 4 (four) times daily as needed.  . ondansetron (ZOFRAN ODT) 4 MG disintegrating tablet Take 1 tablet (4 mg total) by mouth every 6 (six) hours as needed. (Patient not taking: Reported on 08/03/2020)  . oxyCODONE-acetaminophen (PERCOCET/ROXICET) 5-325 MG tablet Take 2 tablets by mouth every 4 (four) hours as needed for severe pain. (Patient taking differently: Take 1 tablet by mouth every 4 (four) hours as needed for severe pain. )  . sevelamer carbonate (RENVELA) 800 MG tablet Take 3,200 mg by mouth 3 (three) times daily with meals. Take 4 tablets with each meal  . temazepam (RESTORIL) 15 MG capsule Take 1 capsule (15 mg total) by mouth at bedtime as needed for sleep. (Patient not taking: Reported on 08/03/2020)  . vitamin E (VITAMIN E) 200 UNIT capsule Take 200 Units by mouth daily.   No facility-administered encounter medications on file as of 08/10/2020.     Objective:  Lab Results  Component Value Date   HGBA1C 5.1 06/04/2018   HGBA1C (H) 09/07/2009    6.5 (NOTE) The ADA recommends the following therapeutic goal for glycemic control related to Hgb A1c measurement: Goal of therapy: <6.5 Hgb A1c  Reference: American  Diabetes Association: Clinical Practice Recommendations 2010, Diabetes Care, 2010, 33: (Suppl  1).   Lab Results  Component Value Date   LDLCALC 97 06/04/2018   CREATININE 6.98 (HH) 05/18/2020   BP Readings from Last 3 Encounters:  08/03/20 (!) 190/84  08/03/20 (!) 190/84  07/24/20 (!) 193/90    Goals Addressed      Patient Stated   .  "I am not really wearing my CPAP" (pt-stated)   On track     Current Barriers:  [name] Knowledge Deficits related to  disease process and Self Health Management for OSA  . Chronic Disease Management support and education needs related to ESRD, HTN, CHF  . Non-adherence to wearing CPAP as directed . Improper CPAP Face Mask  Nurse Case Manager Clinical Goal(s):  . 08/10/20 New Over the next 90 days, patient will continue to work with the CCM RN CM and PCP for ongoing support and disease education for improved Self Health management of OSA w/CPAP use  CCM RN CM Interventions:  08/10/20 call completed with patient  . Evaluation of current treatment plan related to OSA and patient's adherence to plan as established by provider . Determined patient is adhering to wearing her CPAP at least 4-5 hours each night . Determined patient has all DME supplies needed and has the DME supplier contact name/number in order future supplies needed . Determined patient denies having questions or concerns at this time and feels her OSA is being well managed at this time . Discussed plans with patient for ongoing care management follow up and provided patient with direct contact information for care management team  Patient Self Care Activities:  . Self administers medications as prescribed . Attends all scheduled provider appointments . Calls pharmacy for medication refills . Calls provider office for new concerns or questions  Please see past updates related to this goal by clicking on the "Past Updates" button in the selected goal      .  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 Evaluation of current treatment plan related to impaired visual acuity and patient's adherence to plan as established by provider. . Unable to determine if patient has completed a follow up visit with Dr. [name], Ophthalmology per recommendations of PCP   Patient Self Care Activities:  . Self administers medications as prescribed . Attends all scheduled provider appointments . Calls pharmacy for medication refills . Performs ADL's independently . Performs IADL's independently . Calls provider office for new concerns or questions  Initial goal documentation     .  COMPLETED: "I have been stumbling more frequently" (pt-stated)        Current Barriers:  [name] Knowledge Deficits related to evaluation and treatment of Gait disturbance and Impaired Physical Mobility  . Chronic Disease Management support and education needs related to ESRD, HTN, CHF  . ESRD on HD . Lacks needed caregiver assistance   Nurse Case Manager Clinical Goal(s):  [name] Over the next 30 days, patient  will work with PCP to address needs related to evaluation and treatment of patient reported episodes of "stumbling" and impaired gait disturbance  Goal Met  . New 05/08/20 Over the next 90 days, patient will work with the [place] CM and PCP on disease education and support to help improve [place] related to Impaired Mobility and Gait Disturbance with falls   CCM RN CM Interventions:  08/10/20 call completed with patient  . Evaluation of current treatment plan related to Impaired gait/mobility and patient's adherence to plan as established by provider . Determined patient received her rollator walker and denies having any recent falls . Determined patient feels her balance has improved since having less pain to her Sciatica and she plans to continue using her rollator walker for balance support and to help avoid falls  . Discussed plans with patient for ongoing care management follow  up and provided patient with direct contact information for care management team  Patient Self Care Activities:  . Self administers medications as prescribed . Attends all scheduled provider appointments . Calls pharmacy for medication refills . Calls provider office for new concerns or questions  Please see past updates related to this goal by clicking on the "Past Updates" button in the selected goal      .  "My dialysis treatments are not going very good" (pt-stated)   On track     Current Barriers:  [name] Knowledge Deficits related to ESRD . Chronic Disease Management support and education needs related to ESRD, HTN, CHF  . Dysfunctional dialysis access  Nurse Case Manager Clinical Goal(s):  [name] Over the next 90 days, patient will work with the [place] to address needs related to ESRD for disease education related to hemodialysis treatment, fluid balance, dietary concerns and hemodialysis access  Goal Met  . 08/10/20 New Over the next 90 days, patient will continue to work with her Nephrology team and PCP for ongoing education and support for improved Self Health management of ESRD w/HD treatments as directed  CCM RN CM Interventions:  08/10/20 call completed with patient . Evaluation of current treatment plan related to ESRD and patient's adherence to plan as established by provider . Determined patient continues to receive outpatient Hemodialysis at [facility] and is on a Monday, Wednesday, Friday schedule . Determined patient recently underwent an AV Fistulogram with good results per her surgeon and she is healing from this procedure without complications . Discussed and reviewed patient's glomerular rate and discussed her monthly KT/V results which have shown to improve since utilizing her AVF and completing her full course of HD treatments as prescribed . Determined patient feels she is doing a good job following her renal diet and fluid recommendations  which is also improving how her HD are going . Encouraged patient to notify her Nephrologist and or HD staff right away of any concerns related to the patency of her AVF and encouraged daily inspections for bruit/thrill   . Discussed plans with patient for ongoing care management follow up and provided patient with direct contact information for care management team  Patient Self Care Activities:  . Self administers medications as prescribed . Attends all scheduled provider appointments . Calls pharmacy for medication refills . Attends church or other social activities . Performs ADL's independently . Performs IADL's independently . Calls provider office for new concerns or questions  Please see past updates related to this goal by clicking on the "Past Updates" button in the  selected goal        Plan:   Telephone follow up appointment with care management team member scheduled for: 10/05/20  [name], RN, BSN, CCM Care Management Coordinator [place] [facility]  Direct Phone: [phone]

## 2020-08-15 NOTE — Patient Instructions (Signed)
Visit Information  Goals Addressed      Patient Stated   .  "I am not really wearing my CPAP" (pt-stated)   On track     Current Barriers:  [name] Knowledge Deficits related to disease process and Self Health Management for OSA  . Chronic Disease Management support and education needs related to ESRD, HTN, CHF  . Non-adherence to wearing CPAP as directed . Improper CPAP Face Mask  Nurse Case Manager Clinical Goal(s):  . 08/10/20 New Over the next 90 days, patient will continue to work with the CCM RN CM and PCP for ongoing support and disease education for improved Self Health management of OSA w/CPAP use  CCM RN CM Interventions:  08/10/20 call completed with patient  . Evaluation of current treatment plan related to OSA and patient's adherence to plan as established by provider . Determined patient is adhering to wearing her CPAP at least 4-5 hours each night . Determined patient has all DME supplies needed and has the DME supplier contact name/number in order future supplies needed . Determined patient denies having questions or concerns at this time and feels her OSA is being well managed at this time . Discussed plans with patient for ongoing care management follow up and provided patient with direct contact information for care management team  Patient Self Care Activities:  . Self administers medications as prescribed . Attends all scheduled provider appointments . Calls pharmacy for medication refills . Calls provider office for new concerns or questions  Please see past updates related to this goal by clicking on the "Past Updates" button in the selected goal      .  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 Evaluation of current treatment plan related to impaired visual acuity and patient's adherence to plan as established by provider. . Unable to determine if patient has completed a follow up visit with Dr. [name], Ophthalmology per recommendations of PCP   Patient Self Care Activities:  . Self administers medications as prescribed . Attends all scheduled provider appointments . Calls pharmacy for medication refills . Performs ADL's independently . Performs IADL's independently . Calls provider office for new concerns or questions  Initial goal documentation     .  COMPLETED: "I have been stumbling more frequently" (pt-stated)        Current Barriers:  [name] Knowledge Deficits related to evaluation and treatment of Gait disturbance and Impaired Physical Mobility  . Chronic Disease Management support and education needs related to ESRD, HTN, CHF  . ESRD on HD . Lacks needed caregiver assistance   Nurse Case Manager Clinical Goal(s):  [name] Over the next 30 days, patient will work with PCP to address needs related to evaluation and treatment of patient reported episodes of "stumbling" and impaired gait disturbance  Goal Met  . New 05/08/20 Over the next 90 days, patient will work with the [place] CM and PCP on disease education and support to help improve Self  Care Health related to Impaired Mobility and Gait Disturbance with falls   CCM RN CM Interventions:  08/10/20 call completed with patient  . Evaluation of current treatment plan related to Impaired gait/mobility and patient's adherence to plan as established by provider . Determined patient received her rollator walker and denies having any recent falls . Determined patient feels her balance has improved since having less pain  to her Sciatica and she plans to continue using her rollator walker for balance support and to help avoid falls  . Discussed plans with patient for ongoing care management follow up and provided patient with direct contact information for care management team  Patient Self Care Activities:  . Self administers medications as prescribed . Attends all scheduled provider appointments . Calls pharmacy for medication refills . Calls provider office for new concerns or questions  Please see past updates related to this goal by clicking on the "Past Updates" button in the selected goal      .  "My dialysis treatments are not going very good" (pt-stated)   On track     Current Barriers:  [name] Knowledge Deficits related to ESRD . Chronic Disease Management support and education needs related to ESRD, HTN, CHF  . Dysfunctional dialysis access  Nurse Case Manager Clinical Goal(s):  [name] Over the next 90 days, patient will work with the [place] to address needs related to ESRD for disease education related to hemodialysis treatment, fluid balance, dietary concerns and hemodialysis access  Goal Met  . 08/10/20 New Over the next 90 days, patient will continue to work with her Nephrology team and PCP for ongoing education and support for improved Self Health management of ESRD w/HD treatments as directed  CCM RN CM Interventions:  08/10/20 call completed with patient . Evaluation of current treatment plan related to ESRD and patient's adherence to plan as established by provider . Determined patient continues to receive outpatient Hemodialysis at [facility] and is on a Monday, Wednesday, Friday schedule . Determined patient recently underwent an AV Fistulogram with good results per her surgeon and she is healing from this procedure without complications . Discussed and reviewed patient's glomerular rate and discussed her monthly KT/V results which have shown to improve since  utilizing her AVF and completing her full course of HD treatments as prescribed . Determined patient feels she is doing a good job following her renal diet and fluid recommendations which is also improving how her HD are going . Encouraged patient to notify her Nephrologist and or HD staff right away of any concerns related to the patency of her AVF and encouraged daily inspections for bruit/thrill   . Discussed plans with patient for ongoing care management follow up and provided patient with direct contact information for care management team  Patient Self Care Activities:  . Self administers medications as prescribed . Attends all scheduled provider appointments . Calls pharmacy for medication refills . Attends church or other social activities . Performs ADL's independently . Performs IADL's independently . Calls provider office for new concerns or questions  Please see past updates related to this goal by clicking on the "Past Updates" button in the selected goal         Patient verbalizes understanding of instructions provided today.   Telephone follow up appointment with care management team member scheduled for: 10/05/20  [name], RN, BSN, CCM Care Management Coordinator [place] [facility]  Direct Phone: [phone]

## 2020-08-16 DIAGNOSIS — D631 Anemia in chronic kidney disease: Secondary | ICD-10-CM | POA: Diagnosis not present

## 2020-08-16 DIAGNOSIS — D689 Coagulation defect, unspecified: Secondary | ICD-10-CM | POA: Diagnosis not present

## 2020-08-16 DIAGNOSIS — N2581 Secondary hyperparathyroidism of renal origin: Secondary | ICD-10-CM | POA: Diagnosis not present

## 2020-08-16 DIAGNOSIS — A498 Other bacterial infections of unspecified site: Secondary | ICD-10-CM | POA: Diagnosis not present

## 2020-08-16 DIAGNOSIS — D509 Iron deficiency anemia, unspecified: Secondary | ICD-10-CM | POA: Diagnosis not present

## 2020-08-16 DIAGNOSIS — N186 End stage renal disease: Secondary | ICD-10-CM | POA: Diagnosis not present

## 2020-08-16 DIAGNOSIS — Z992 Dependence on renal dialysis: Secondary | ICD-10-CM | POA: Diagnosis not present

## 2020-08-18 DIAGNOSIS — D509 Iron deficiency anemia, unspecified: Secondary | ICD-10-CM | POA: Diagnosis not present

## 2020-08-18 DIAGNOSIS — N186 End stage renal disease: Secondary | ICD-10-CM | POA: Diagnosis not present

## 2020-08-18 DIAGNOSIS — D689 Coagulation defect, unspecified: Secondary | ICD-10-CM | POA: Diagnosis not present

## 2020-08-18 DIAGNOSIS — A498 Other bacterial infections of unspecified site: Secondary | ICD-10-CM | POA: Diagnosis not present

## 2020-08-18 DIAGNOSIS — D631 Anemia in chronic kidney disease: Secondary | ICD-10-CM | POA: Diagnosis not present

## 2020-08-18 DIAGNOSIS — N2581 Secondary hyperparathyroidism of renal origin: Secondary | ICD-10-CM | POA: Diagnosis not present

## 2020-08-18 DIAGNOSIS — Z992 Dependence on renal dialysis: Secondary | ICD-10-CM | POA: Diagnosis not present

## 2020-08-21 ENCOUNTER — Telehealth: Payer: Self-pay

## 2020-08-21 DIAGNOSIS — D509 Iron deficiency anemia, unspecified: Secondary | ICD-10-CM | POA: Diagnosis not present

## 2020-08-21 DIAGNOSIS — D631 Anemia in chronic kidney disease: Secondary | ICD-10-CM | POA: Diagnosis not present

## 2020-08-21 DIAGNOSIS — N186 End stage renal disease: Secondary | ICD-10-CM | POA: Diagnosis not present

## 2020-08-21 DIAGNOSIS — Z992 Dependence on renal dialysis: Secondary | ICD-10-CM | POA: Diagnosis not present

## 2020-08-21 DIAGNOSIS — D689 Coagulation defect, unspecified: Secondary | ICD-10-CM | POA: Diagnosis not present

## 2020-08-21 DIAGNOSIS — N2581 Secondary hyperparathyroidism of renal origin: Secondary | ICD-10-CM | POA: Diagnosis not present

## 2020-08-21 DIAGNOSIS — A498 Other bacterial infections of unspecified site: Secondary | ICD-10-CM | POA: Diagnosis not present

## 2020-08-22 ENCOUNTER — Ambulatory Visit: Payer: Medicare Other

## 2020-08-22 DIAGNOSIS — N186 End stage renal disease: Secondary | ICD-10-CM

## 2020-08-22 NOTE — Chronic Care Management (AMB) (Signed)
  Chronic Care Management   Outreach Note  08/22/2020 Name: Whitney Walker MRN: [id number] DOB: 04-05-1956  Referred by: [name], FNP Reason for referral : [place] collaboration with embedded PharmD pharmacy technician reporting the patient is interested in alternative transportation resources for Dialysis treatments. Successful outbound call placed to the patient who report she is currently using [employer] as her transportation provider. Patient reports late pick up times causing her to be late for dialysis and extended trips after dialysis which cause her fatigue. Patient reports she was not dropped off yesterday in a timely manner but instead was forced to ride the bus for an extended amount of time while the driver picked up two other trips. Patient reported [employer] [facility] is low on drivers which is causing the delays.  Discussed barriers to several agencies due to employee workforce at this time. Reviewed alternative transportation resources. The patient is not interested in accessing [employer] or [employer] due to past experiences. Advised the patient the alternative to ride share programs would be to privately pay for services. The patient stated understanding. SW also encouraged the patient to speak with her Dialysis caseworker to see if alternative transportation resources were available.   Follow Up Plan: No SW follow up planned at this time. The patient will remain active with RN Care Manager and embedded PharmD.  [name], BSW, CDP Social Worker, Certified Dementia Practitioner [place] [facility] [place] [facility] [phone] .

## 2020-08-22 NOTE — Chronic Care Management (AMB) (Signed)
Chronic Care Management Pharmacy Assistant   Name: Whitney Walker  MRN: [id number] DOB: [birth year]  Reason for Encounter: Transportation Assistance/ Questions  PCP : Whitney Brine, FNP  Allergies:   Allergies  Allergen Reactions  . Carnosine     Other reaction(s): Unknown  . Gadolinium Derivatives Hives and Other (See Comments)    HIVES, Desc: HIVES W/ "DYE" USED FOR 1ST CT SCAN BUT NOT 2ND, NO PREMEDS USED, PT UNCERTAIN OF CIRCUMSTANCES,,?POSSIBLE MRI CONTRAST ALLERGY, ALL STUDIES DONE "SOMEWHERE" IN [place]//A.C., Onset Date: [id number]  . Iohexol Other (See Comments)     Code: HIVES, Desc: HIVES W/ "DYE" USED FOR 1ST CT SCAN BUT NOT 2ND, NO PREMEDS USED, PT UNCERTAIN OF CIRCUMSTANCES,,?POSSIBLE MRI CONTRAST ALLERGY, ALL STUDIES DONE "SOMEWHERE" IN [place]//A.C., Onset Date: [id number]   . Iodine Hives  . Naltrexone     Other reaction(s): Unknown    Medications: Outpatient Encounter Medications as of 08/21/2020  Medication Sig  . acetaminophen (TYLENOL) 500 MG tablet Take 1,000 mg by mouth 2 (two) times daily as needed for moderate pain or headache.  . albuterol (VENTOLIN HFA) 108 (90 Base) MCG/ACT inhaler Inhale 2 puffs into the lungs every 6 (six) hours as needed for wheezing or shortness of breath.  [name] apixaban (ELIQUIS) 2.5 MG TABS tablet Take 1 tablet (2.5 mg total) by mouth 2 (two) times daily.  . Ascorbic Acid (VITAMIN C) 100 MG tablet Take 100 mg by mouth daily.  . Biotin 10000 MCG TABS Take 10,000 mcg by mouth daily.   . carvedilol (COREG) 12.5 MG tablet Take 12.5 mg by mouth See admin instructions. Takes Twice daily except Only takes once a day on dialysis days (holds in morning on Monday Wednesday and Friday)  . eszopiclone (LUNESTA) 2 MG TABS tablet Take 1 tablet (2 mg total) by mouth at bedtime as needed for sleep. Take immediately before bedtime  . meclizine (ANTIVERT) 12.5 MG tablet Take 1 tablet (12.5 mg total) by mouth 3 (three) times daily as needed  for dizziness.  . multivitamin (RENA-VIT) TABS tablet Take 1 tablet by mouth at bedtime.  . mupirocin ointment (BACTROBAN) 2 % Apply 1 application topically daily as needed (skin bumps).  . nystatin (NYSTATIN) powder Apply 1 application topically 4 (four) times daily as needed.  . ondansetron (ZOFRAN ODT) 4 MG disintegrating tablet Take 1 tablet (4 mg total) by mouth every 6 (six) hours as needed. (Patient not taking: Reported on 08/03/2020)  . oxyCODONE-acetaminophen (PERCOCET/ROXICET) 5-325 MG tablet Take 2 tablets by mouth every 4 (four) hours as needed for severe pain. (Patient taking differently: Take 1 tablet by mouth every 4 (four) hours as needed for severe pain. )  . sevelamer carbonate (RENVELA) 800 MG tablet Take 3,200 mg by mouth 3 (three) times daily with meals. Take 4 tablets with each meal  . temazepam (RESTORIL) 15 MG capsule Take 1 capsule (15 mg total) by mouth at bedtime as needed for sleep. (Patient not taking: Reported on 08/03/2020)  . vitamin E (VITAMIN E) 200 UNIT capsule Take 200 Units by mouth daily.   No facility-administered encounter medications on file as of 08/21/2020.    Current Diagnosis: Patient Active Problem List   Diagnosis Date Noted  . Hyperkalemia 05/09/2020  . Acute respiratory failure with hypoxia ([place]) 05/09/2020  . Left leg pain 05/09/2020  . Restless leg syndrome 09/19/2018  . OSA (obstructive sleep apnea) 04/29/2018  . ESRD on dialysis ([place]) 04/29/2018  . Dyspnea on exertion 03/12/2017  . Morbid (  severe) obesity due to excess calories ([place]) 03/12/2017  . Anemia of chronic kidney failure, stage 3 (moderate) 12/10/2016  . History of colon cancer 08/12/2016  . Mass of left breast on mammogram 12/10/2015  . HTN (hypertension) 10/18/2015  . Depression 10/18/2015  . Numbness and tingling of left arm and leg 01/24/2015  . PVD (peripheral vascular disease) with claudication ([place]) 03/23/2014  . PAD (peripheral artery disease) ([place]) 12/14/2013  . Chronic  diastolic heart failure ([place]) 11/15/2013  . Essential hypertension 11/15/2013  . Leucocytosis 04/28/2012     Follow-Up:  Pharmacist Review- Patient called upset with transportation services, she complained about the wait times to be picked up from Dialysis. Patient states she was waiting over 20 minutes for pick up and she does not feel well after dialysis. Advised patient that I will call [employer] technologist for ETA.  Spoke with Thedore Mins from [employer] he stated driver was 8 minutes out and they were low on driver but they will be there asap.  Called patient back to inform ETA of 8 minutes. Patient aware but she would like to have a different transportation service. Advised that I will speak with Jannette Fogo, CPP regarding this matter to see who we can get to help with this service.  Jannette Fogo, CPP notified, she gave me [name], LSW as a reference to assist Whitney Walker.  Email sent out to [name] : From: Juliane Poot  Sent: 08/21/2020 12:50 PM EDT  To: Philis Kendall afternoon Tillie Rung,  I had a few calls with Whitney Walker today, she was very upset with [facility] transportation services, she states they do not have any drivers and she has to wait long periods of time to be picked up from dialysis, she complained that she already doesn't feel good after dialysis and having to wait is worse. She is wondering about another transportation service, she does not want [employer] or [employer]. This morning she said the driver was told to pick up other patient on the way before they would take her home and she lives about 8 minutes from the Dialysis center. Jannette Fogo, CPP is aware, can you help her with this? She probably won't answer the phone today but if you could give her a call tomorrow that would be great.  Thank you  [name], [place], [place]    [name] will follow up with patient on 08/22/20. Pharmacist aware.  08/22/20- Spoke patient to  follow up to see how she was doing and if LSW was able to contact regarding transportation. She was able to speak with [name], LSW this morning she gave her some transportation options but she is still going to check on some other options, like [employer].  08/23/20- Patient called inquiring where she can get a COVID test due to her going out of town and needing to have dialysis at a different location. Patient states she is leaving on Saturday and returning the 5th, her dialysis center stated she might need a COVID test to be able to have her dialysis at another center. Informed patient that I will notify Jannette Fogo, CPP so she could get some options with her PCP. Spoke with pharmacist, PCP could get patient in on 08/24/20 but she would need to try and get transportation scheduled. Returned patient call, update appointment information available, patient states she would not be able to due to being less than 24 hour notice for transportation but she was  able to speak with the Dialysis clinic in the location she was going and they informed her she would not need a COVID test, unfortunately they did not have any openings to schedule her for dialysis and will check another location. Patient was grateful for the assistance we gave. Jannette Fogo, CPP notified.  [name], [place] Pharmacist Assistant [phone]

## 2020-08-23 DIAGNOSIS — N186 End stage renal disease: Secondary | ICD-10-CM | POA: Diagnosis not present

## 2020-08-23 DIAGNOSIS — Z992 Dependence on renal dialysis: Secondary | ICD-10-CM | POA: Diagnosis not present

## 2020-08-23 DIAGNOSIS — D631 Anemia in chronic kidney disease: Secondary | ICD-10-CM | POA: Diagnosis not present

## 2020-08-23 DIAGNOSIS — N2581 Secondary hyperparathyroidism of renal origin: Secondary | ICD-10-CM | POA: Diagnosis not present

## 2020-08-23 DIAGNOSIS — A498 Other bacterial infections of unspecified site: Secondary | ICD-10-CM | POA: Diagnosis not present

## 2020-08-23 DIAGNOSIS — D509 Iron deficiency anemia, unspecified: Secondary | ICD-10-CM | POA: Diagnosis not present

## 2020-08-23 DIAGNOSIS — D689 Coagulation defect, unspecified: Secondary | ICD-10-CM | POA: Diagnosis not present

## 2020-08-25 DIAGNOSIS — N2581 Secondary hyperparathyroidism of renal origin: Secondary | ICD-10-CM | POA: Diagnosis not present

## 2020-08-25 DIAGNOSIS — D509 Iron deficiency anemia, unspecified: Secondary | ICD-10-CM | POA: Diagnosis not present

## 2020-08-25 DIAGNOSIS — D631 Anemia in chronic kidney disease: Secondary | ICD-10-CM | POA: Diagnosis not present

## 2020-08-25 DIAGNOSIS — D689 Coagulation defect, unspecified: Secondary | ICD-10-CM | POA: Diagnosis not present

## 2020-08-25 DIAGNOSIS — N186 End stage renal disease: Secondary | ICD-10-CM | POA: Diagnosis not present

## 2020-08-25 DIAGNOSIS — A498 Other bacterial infections of unspecified site: Secondary | ICD-10-CM | POA: Diagnosis not present

## 2020-08-25 DIAGNOSIS — Z992 Dependence on renal dialysis: Secondary | ICD-10-CM | POA: Diagnosis not present

## 2020-08-28 DIAGNOSIS — I509 Heart failure, unspecified: Secondary | ICD-10-CM | POA: Diagnosis not present

## 2020-08-28 DIAGNOSIS — I2609 Other pulmonary embolism with acute cor pulmonale: Secondary | ICD-10-CM | POA: Diagnosis not present

## 2020-08-28 DIAGNOSIS — N2581 Secondary hyperparathyroidism of renal origin: Secondary | ICD-10-CM | POA: Diagnosis not present

## 2020-08-28 DIAGNOSIS — Z992 Dependence on renal dialysis: Secondary | ICD-10-CM | POA: Diagnosis not present

## 2020-08-28 DIAGNOSIS — G4733 Obstructive sleep apnea (adult) (pediatric): Secondary | ICD-10-CM | POA: Diagnosis not present

## 2020-08-28 DIAGNOSIS — D689 Coagulation defect, unspecified: Secondary | ICD-10-CM | POA: Diagnosis not present

## 2020-08-28 DIAGNOSIS — D631 Anemia in chronic kidney disease: Secondary | ICD-10-CM | POA: Diagnosis not present

## 2020-08-28 DIAGNOSIS — N186 End stage renal disease: Secondary | ICD-10-CM | POA: Diagnosis not present

## 2020-08-28 DIAGNOSIS — D509 Iron deficiency anemia, unspecified: Secondary | ICD-10-CM | POA: Diagnosis not present

## 2020-08-28 DIAGNOSIS — A498 Other bacterial infections of unspecified site: Secondary | ICD-10-CM | POA: Diagnosis not present

## 2020-08-29 ENCOUNTER — Telehealth: Payer: Self-pay

## 2020-08-29 DIAGNOSIS — Z992 Dependence on renal dialysis: Secondary | ICD-10-CM | POA: Diagnosis not present

## 2020-08-29 DIAGNOSIS — I129 Hypertensive chronic kidney disease with stage 1 through stage 4 chronic kidney disease, or unspecified chronic kidney disease: Secondary | ICD-10-CM | POA: Diagnosis not present

## 2020-08-29 DIAGNOSIS — N186 End stage renal disease: Secondary | ICD-10-CM | POA: Diagnosis not present

## 2020-08-29 NOTE — Patient Instructions (Addendum)
Visit Information  Goals Addressed            This Visit's Progress   . Pharmacy Care Plan       CARE PLAN ENTRY (see longitudinal plan of care for additional care plan information)  Current Barriers:  . Chronic Disease Management support, education, and care coordination needs related to Hypertension, Hyperlipidemia, and Chronic Kidney Disease   Hypertension BP Readings from Last 3 Encounters:  05/18/20 (!) 149/81  05/16/20 (!) 174/83  05/10/20 (!) 171/86   . Pharmacist Clinical Goal(s): o Over the next 180 days, patient will work with PharmD and providers to achieve BP goal <130/80 . Current regimen:  o Carvedilol 12.5mg  twice daily (once daily on dialysis days) . Interventions: o Provided dietary and exercise recommendations o Consider additional blood pressure lowering medication if blood pressure remains elevated. Would need to consult dialysis before adding additional medication. o Collaborate with PCP . Patient self care activities - Over the next 180 days, patient will: o Check BP 2-3 times daily, document, and provide at future appointments o Ensure daily salt intake < 2300 mg/day o Try to exercise for 30 minutes 5 times weekly  Hyperlipidemia Lab Results  Component Value Date/Time   LDLCALC 97 06/04/2018 12:00 AM   . Pharmacist Clinical Goal(s): o Over the next 120 days, patient will work with PharmD and providers to achieve LDL goal < 70 . Current regimen:  o N/A . Interventions: o Recommend lipid panel at next appointment o Determine why atorvastatin was discontinued o Discuss starting atorvastatin or another cholesterol medication with PCP and patient at follow up . Patient self care activities - Over the next 120 days, patient will: o Focus on eating healthy and exercising  Chronic Kidney Disease on Dialysis . Pharmacist Clinical Goal(s) o Over the next 90 days, patient will work with PharmD and providers to minimize complications as a result of  dialysis . Current regimen:  o Nephro Carb steady three times weekly during dialysis o Renvela 800mg  4 tablets 3 times daily with each meal . Interventions: o Recommend patient follow up with neurologist regarding complaint of losing memory from dialysis o Collaboration with PCP regarding patient's complaint of pain at catheter site - Advised patient that we can prescribe Lidocaine gel applied topically if needed . Patient self care activities - Over the next 90 days, patient will: o Consider neurologist follow up o Obtain bandages from current dialysis center o Notify PCP if interested in topical agent for pain at catheter site  Medication management . Pharmacist Clinical Goal(s): o Over the next 90 days, patient will work with PharmD and providers to achieve optimal medication adherence . Current pharmacy: [facility] . Interventions o Comprehensive medication review performed. o Utilize [facility] for medication synchronization, packaging and delivery . Patient self care activities - Over the next 90 days, patient will: o Focus on medication adherence by using adherence packaging and medication delivery services o Take medications as prescribed o Report any questions or concerns to PharmD and/or provider(s)  Please see past updates related to this goal by clicking on the "Past Updates" button in the selected goal        The patient verbalized understanding of instructions provided today and agreed to receive a mailed copy of patient instruction and/or educational materials.  Telephone follow up appointment with pharmacy team member scheduled for: 10/20/20 @ 3:00 PM   Jannette Fogo, PharmD Clinical Pharmacist [facility] [phone]   [facility] Eating Plan  DASH stands for "Dietary Approaches to Stop Hypertension." The DASH eating plan is a healthy eating plan that has been shown to reduce high blood pressure (hypertension). It may also  reduce your risk for type 2 diabetes, heart disease, and stroke. The DASH eating plan may also help with weight loss. What are tips for following this plan?  General guidelines  Avoid eating more than 2,300 mg (milligrams) of salt (sodium) a day. If you have hypertension, you may need to reduce your sodium intake to 1,500 mg a day.  Limit alcohol intake to no more than 1 drink a day for nonpregnant women and 2 drinks a day for men. One drink equals 12 oz of beer, 5 oz of wine, or 1 oz of hard liquor.  Work with your health care provider to maintain a healthy body weight or to lose weight. Ask what an ideal weight is for you.  Get at least 30 minutes of exercise that causes your heart to beat faster (aerobic exercise) most days of the week. Activities may include walking, swimming, or biking.  Work with your health care provider or diet and nutrition specialist (dietitian) to adjust your eating plan to your individual calorie needs. Reading food labels   Check food labels for the amount of sodium per serving. Choose foods with less than 5 percent of the Daily Value of sodium. Generally, foods with less than 300 mg of sodium per serving fit into this eating plan.  To find whole grains, look for the word "whole" as the first word in the ingredient list. Shopping  Buy products labeled as "low-sodium" or "no salt added."  Buy fresh foods. Avoid canned foods and premade or frozen meals. Cooking  Avoid adding salt when cooking. Use salt-free seasonings or herbs instead of table salt or sea salt. Check with your health care provider or pharmacist before using salt substitutes.  Do not fry foods. Cook foods using healthy methods such as baking, boiling, grilling, and broiling instead.  Cook with heart-healthy oils, such as olive, canola, soybean, or sunflower oil. Meal planning  Eat a balanced diet that includes: ? 5 or more servings of fruits and vegetables each day. At each meal, try to  fill half of your plate with fruits and vegetables. ? Up to 6-8 servings of whole grains each day. ? Less than 6 oz of lean meat, poultry, or fish each day. A 3-oz serving of meat is about the same size as a deck of cards. One egg equals 1 oz. ? 2 servings of low-fat dairy each day. ? A serving of nuts, seeds, or beans 5 times each week. ? Heart-healthy fats. Healthy fats called Omega-3 fatty acids are found in foods such as flaxseeds and coldwater fish, like sardines, salmon, and mackerel.  Limit how much you eat of the following: ? Canned or prepackaged foods. ? Food that is high in trans fat, such as fried foods. ? Food that is high in saturated fat, such as fatty meat. ? Sweets, desserts, sugary drinks, and other foods with added sugar. ? Full-fat dairy products.  Do not salt foods before eating.  Try to eat at least 2 vegetarian meals each week.  Eat more home-cooked food and less restaurant, buffet, and fast food.  When eating at a restaurant, ask that your food be prepared with less salt or no salt, if possible. What foods are recommended? The items listed may not be a complete list. Talk with your dietitian about what dietary  choices are best for you. Grains Whole-grain or whole-wheat bread. Whole-grain or whole-wheat pasta. Brown rice. [place]. Bulgur. Whole-grain and low-sodium cereals. Pita bread. Low-fat, low-sodium crackers. Whole-wheat flour tortillas. Vegetables Fresh or frozen vegetables (raw, steamed, roasted, or grilled). Low-sodium or reduced-sodium tomato and vegetable juice. Low-sodium or reduced-sodium tomato sauce and tomato paste. Low-sodium or reduced-sodium canned vegetables. Fruits All fresh, dried, or frozen fruit. Canned fruit in natural juice (without added sugar). Meat and other protein foods Skinless chicken or [place]. Ground chicken or [place]. Pork with fat trimmed off. Fish and seafood. Egg whites. Dried beans, peas, or lentils. Unsalted nuts,  nut butters, and seeds. Unsalted canned beans. Lean cuts of beef with fat trimmed off. Low-sodium, lean deli meat. Dairy Low-fat (1%) or fat-free (skim) milk. Fat-free, low-fat, or reduced-fat cheeses. Nonfat, low-sodium ricotta or cottage cheese. Low-fat or nonfat yogurt. Low-fat, low-sodium cheese. Fats and oils Soft margarine without trans fats. Vegetable oil. Low-fat, reduced-fat, or light mayonnaise and salad dressings (reduced-sodium). Canola, safflower, olive, soybean, and sunflower oils. Avocado. Seasoning and other foods Herbs. Spices. Seasoning mixes without salt. Unsalted popcorn and pretzels. Fat-free sweets. What foods are not recommended? The items listed may not be a complete list. Talk with your dietitian about what dietary choices are best for you. Grains Baked goods made with fat, such as croissants, muffins, or some breads. Dry pasta or rice meal packs. Vegetables Creamed or fried vegetables. Vegetables in a cheese sauce. Regular canned vegetables (not low-sodium or reduced-sodium). Regular canned tomato sauce and paste (not low-sodium or reduced-sodium). Regular tomato and vegetable juice (not low-sodium or reduced-sodium). Angie Fava. Olives. Fruits Canned fruit in a light or heavy syrup. Fried fruit. Fruit in cream or butter sauce. Meat and other protein foods Fatty cuts of meat. Ribs. Fried meat. Berniece Salines. Sausage. Bologna and other processed lunch meats. Salami. Fatback. Hotdogs. Bratwurst. Salted nuts and seeds. Canned beans with added salt. Canned or smoked fish. Whole eggs or egg yolks. Chicken or [place] with skin. Dairy Whole or 2% milk, cream, and half-and-half. Whole or full-fat cream cheese. Whole-fat or sweetened yogurt. Full-fat cheese. Nondairy creamers. Whipped toppings. Processed cheese and cheese spreads. Fats and oils Butter. Stick margarine. Lard. Shortening. Ghee. Bacon fat. Tropical oils, such as coconut, palm kernel, or palm oil. Seasoning and other  foods Salted popcorn and pretzels. Onion salt, garlic salt, seasoned salt, table salt, and sea salt. Worcestershire sauce. Tartar sauce. Barbecue sauce. Teriyaki sauce. Soy sauce, including reduced-sodium. Steak sauce. Canned and packaged gravies. Fish sauce. Oyster sauce. Cocktail sauce. Horseradish that you find on the shelf. Ketchup. Mustard. Meat flavorings and tenderizers. Bouillon cubes. Hot sauce and Tabasco sauce. Premade or packaged marinades. Premade or packaged taco seasonings. Relishes. Regular salad dressings. Where to find more information:  National Heart, Lung, and [place]: https://wilson-eaton.com/  American Heart Association: www.heart.org Summary  The DASH eating plan is a healthy eating plan that has been shown to reduce high blood pressure (hypertension). It may also reduce your risk for type 2 diabetes, heart disease, and stroke.  With the DASH eating plan, you should limit salt (sodium) intake to 2,300 mg a day. If you have hypertension, you may need to reduce your sodium intake to 1,500 mg a day.  When on the DASH eating plan, aim to eat more fresh fruits and vegetables, whole grains, lean proteins, low-fat dairy, and heart-healthy fats.  Work with your health care provider or diet and nutrition specialist (dietitian) to adjust your eating plan to your individual calorie  needs. This information is not intended to replace advice given to you by your health care provider. Make sure you discuss any questions you have with your health care provider. Document Revised: 11/28/2017 Document Reviewed: 12/09/2016 Elsevier Patient Education  2020 [employer].

## 2020-08-29 NOTE — Chronic Care Management (AMB) (Signed)
Chronic Care Management Pharmacy Assistant   Name: Whitney Walker  MRN: [id number] DOB: 05/02/1956  Reason for Encounter: Medication Review/Monthly Dispensing Call   PCP : Whitney Brine, FNP  Allergies:   Allergies  Allergen Reactions  . Carnosine     Other reaction(s): Unknown  . Gadolinium Derivatives Hives and Other (See Comments)    HIVES, Desc: HIVES W/ "DYE" USED FOR 1ST CT SCAN BUT NOT 2ND, NO PREMEDS USED, PT UNCERTAIN OF CIRCUMSTANCES,,?POSSIBLE MRI CONTRAST ALLERGY, ALL STUDIES DONE "SOMEWHERE" IN [place]//A.C., Onset Date: [id number]  . Iohexol Other (See Comments)     Code: HIVES, Desc: HIVES W/ "DYE" USED FOR 1ST CT SCAN BUT NOT 2ND, NO PREMEDS USED, PT UNCERTAIN OF CIRCUMSTANCES,,?POSSIBLE MRI CONTRAST ALLERGY, ALL STUDIES DONE "SOMEWHERE" IN [place]//A.C., Onset Date: [id number]   . Iodine Hives  . Naltrexone     Other reaction(s): Unknown    Medications: Outpatient Encounter Medications as of 08/29/2020  Medication Sig  . acetaminophen (TYLENOL) 500 MG tablet Take 1,000 mg by mouth 2 (two) times daily as needed for moderate pain or headache.  . albuterol (VENTOLIN HFA) 108 (90 Base) MCG/ACT inhaler Inhale 2 puffs into the lungs every 6 (six) hours as needed for wheezing or shortness of breath.  [name] apixaban (ELIQUIS) 2.5 MG TABS tablet Take 1 tablet (2.5 mg total) by mouth 2 (two) times daily.  . Ascorbic Acid (VITAMIN C) 100 MG tablet Take 100 mg by mouth daily.  . Biotin 10000 MCG TABS Take 10,000 mcg by mouth daily.   . carvedilol (COREG) 12.5 MG tablet Take 12.5 mg by mouth See admin instructions. Takes Twice daily except Only takes once a day on dialysis days (holds in morning on Monday Wednesday and Friday)  . eszopiclone (LUNESTA) 2 MG TABS tablet Take 1 tablet (2 mg total) by mouth at bedtime as needed for sleep. Take immediately before bedtime  . meclizine (ANTIVERT) 12.5 MG tablet Take 1 tablet (12.5 mg total) by mouth 3 (three) times daily as  needed for dizziness.  . multivitamin (RENA-VIT) TABS tablet Take 1 tablet by mouth at bedtime.  . mupirocin ointment (BACTROBAN) 2 % Apply 1 application topically daily as needed (skin bumps).  . nystatin (NYSTATIN) powder Apply 1 application topically 4 (four) times daily as needed.  . ondansetron (ZOFRAN ODT) 4 MG disintegrating tablet Take 1 tablet (4 mg total) by mouth every 6 (six) hours as needed. (Patient not taking: Reported on 08/03/2020)  . oxyCODONE-acetaminophen (PERCOCET/ROXICET) 5-325 MG tablet Take 2 tablets by mouth every 4 (four) hours as needed for severe pain. (Patient taking differently: Take 1 tablet by mouth every 4 (four) hours as needed for severe pain. )  . sevelamer carbonate (RENVELA) 800 MG tablet Take 3,200 mg by mouth 3 (three) times daily with meals. Take 4 tablets with each meal  . temazepam (RESTORIL) 15 MG capsule Take 1 capsule (15 mg total) by mouth at bedtime as needed for sleep. (Patient not taking: Reported on 08/03/2020)  . vitamin E (VITAMIN E) 200 UNIT capsule Take 200 Units by mouth daily.   No facility-administered encounter medications on file as of 08/29/2020.    Current Diagnosis: Patient Active Problem List   Diagnosis Date Noted  . Hyperkalemia 05/09/2020  . Acute respiratory failure with hypoxia ([place]) 05/09/2020  . Left leg pain 05/09/2020  . Restless leg syndrome 09/19/2018  . OSA (obstructive sleep apnea) 04/29/2018  . ESRD on dialysis ([place]) 04/29/2018  . Dyspnea on exertion 03/12/2017  .  Morbid (severe) obesity due to excess calories ([place]) 03/12/2017  . Anemia of chronic kidney failure, stage 3 (moderate) 12/10/2016  . History of colon cancer 08/12/2016  . Mass of left breast on mammogram 12/10/2015  . HTN (hypertension) 10/18/2015  . Depression 10/18/2015  . Numbness and tingling of left arm and leg 01/24/2015  . PVD (peripheral vascular disease) with claudication ([place]) 03/23/2014  . PAD (peripheral artery disease) ([place]) 12/14/2013  .  Chronic diastolic heart failure ([place]) 11/15/2013  . Essential hypertension 11/15/2013  . Leucocytosis 04/28/2012   Reviewed chart for medication changes ahead of medication coordination call.  Patient had a visit 08/22/20 with CCM social worker Whitney Walker, 08/21/20 Whitney Walker, 08/10/20 CCM nurse Whitney Walker since last care coordination Pharmacist telephone visit on 08/08/20 with Whitney Walker.  No medication changes indicated.  BP Readings from Last 3 Encounters:  08/03/20 (!) 190/84  08/03/20 (!) 190/84  07/24/20 (!) 193/90    Lab Results  Component Value Date   HGBA1C 5.1 06/04/2018     Patient obtains medications through [facility]  30 Days and 90 days  Last adherence delivery included: Carvedilol 12 mg- 1 tablet on Monday and Wednesday and once weekly on Friday at dinner and take 1 tablet twice a day Tuesday, Thursday, Saturday and Sunday; Eliquis 2.5 mg- 1 tablet twice a day; Meclizine 12.5 mg- 1 tablet three times a day as needed for dizziness; albuterol 108 mcg - inhale 2 puffs every 6 hours as needed for wheezing or shortness of breath; eszopiclone 2 mg- 1 tablet at bedtime as needed for sleep.  Patient did not decline any medications last month, new to [facility].  Patient is due for next adherence delivery on: 09/03/2020.  Called patient and reviewed medications and coordinated delivery. Spoke with patient, she does not need any medications at this time. Patient states she has a few bottles of her Carvedilol and Eliquis due to not taking all the time. Other medications are PRN- Meclizine, Albuterol, Zofran, Nystatin and she is not in need of those. Patient stopped taking Eszopiclone 2 mg due to complaints of hallucinations after taking it, so she discontinued medication.  This delivery to include: NONE NEEDED AT THIS TIME  Patient declined the following medications: Carvedilol and Eliquis due to having an abundance on hand per directions and extra refills  given.   Follow-Up:  Coordination of Enhanced Pharmacy Walker and Pharmacist Review- Patient aware that no medications will be delivered next month on 09/03/20 but we will follow up for October medication delivery. Inquired about blood pressures due to being elevated at last phone call with patient, she states blood pressure are still high, yesterday reading was 212/108 using a wrist blood pressure machine. Patient also states it fluctuates from one arm to the other and her readings are always high at Dialysis before she starts her treatment, but after its low. Patient aware that visit is needed with PCP but she does not want to come into the office, transportation issues, patient is requesting a telephone visit. Informed that I will notify Jannette Fogo, CPP so she can coordinate this care with her PCP Whitney Walker. Patient also would like Eszopiclone 2 mg taken off of her list to do having hallucinations when she took medication. Pharmacist notified.  [name], [place] Pharmacist Assistant [phone]

## 2020-08-30 DIAGNOSIS — D631 Anemia in chronic kidney disease: Secondary | ICD-10-CM | POA: Diagnosis not present

## 2020-08-30 DIAGNOSIS — D509 Iron deficiency anemia, unspecified: Secondary | ICD-10-CM | POA: Diagnosis not present

## 2020-08-30 DIAGNOSIS — N186 End stage renal disease: Secondary | ICD-10-CM | POA: Diagnosis not present

## 2020-08-30 DIAGNOSIS — Z992 Dependence on renal dialysis: Secondary | ICD-10-CM | POA: Diagnosis not present

## 2020-08-30 DIAGNOSIS — D689 Coagulation defect, unspecified: Secondary | ICD-10-CM | POA: Diagnosis not present

## 2020-08-30 DIAGNOSIS — T80211A Bloodstream infection due to central venous catheter, initial encounter: Secondary | ICD-10-CM | POA: Diagnosis not present

## 2020-08-30 DIAGNOSIS — N2581 Secondary hyperparathyroidism of renal origin: Secondary | ICD-10-CM | POA: Diagnosis not present

## 2020-09-01 DIAGNOSIS — N186 End stage renal disease: Secondary | ICD-10-CM | POA: Diagnosis not present

## 2020-09-01 DIAGNOSIS — Z992 Dependence on renal dialysis: Secondary | ICD-10-CM | POA: Diagnosis not present

## 2020-09-01 DIAGNOSIS — T80211A Bloodstream infection due to central venous catheter, initial encounter: Secondary | ICD-10-CM | POA: Diagnosis not present

## 2020-09-01 DIAGNOSIS — D689 Coagulation defect, unspecified: Secondary | ICD-10-CM | POA: Diagnosis not present

## 2020-09-01 DIAGNOSIS — N2581 Secondary hyperparathyroidism of renal origin: Secondary | ICD-10-CM | POA: Diagnosis not present

## 2020-09-01 DIAGNOSIS — D509 Iron deficiency anemia, unspecified: Secondary | ICD-10-CM | POA: Diagnosis not present

## 2020-09-01 DIAGNOSIS — D631 Anemia in chronic kidney disease: Secondary | ICD-10-CM | POA: Diagnosis not present

## 2020-09-04 DIAGNOSIS — Z992 Dependence on renal dialysis: Secondary | ICD-10-CM | POA: Diagnosis not present

## 2020-09-04 DIAGNOSIS — D689 Coagulation defect, unspecified: Secondary | ICD-10-CM | POA: Diagnosis not present

## 2020-09-04 DIAGNOSIS — N2581 Secondary hyperparathyroidism of renal origin: Secondary | ICD-10-CM | POA: Diagnosis not present

## 2020-09-04 DIAGNOSIS — T80211A Bloodstream infection due to central venous catheter, initial encounter: Secondary | ICD-10-CM | POA: Diagnosis not present

## 2020-09-04 DIAGNOSIS — N186 End stage renal disease: Secondary | ICD-10-CM | POA: Diagnosis not present

## 2020-09-04 DIAGNOSIS — D509 Iron deficiency anemia, unspecified: Secondary | ICD-10-CM | POA: Diagnosis not present

## 2020-09-04 DIAGNOSIS — D631 Anemia in chronic kidney disease: Secondary | ICD-10-CM | POA: Diagnosis not present

## 2020-09-06 ENCOUNTER — Telehealth: Payer: Self-pay

## 2020-09-06 ENCOUNTER — Other Ambulatory Visit: Payer: Self-pay | Admitting: Nurse Practitioner

## 2020-09-06 DIAGNOSIS — I10 Essential (primary) hypertension: Secondary | ICD-10-CM

## 2020-09-06 DIAGNOSIS — Z992 Dependence on renal dialysis: Secondary | ICD-10-CM | POA: Diagnosis not present

## 2020-09-06 DIAGNOSIS — D689 Coagulation defect, unspecified: Secondary | ICD-10-CM | POA: Diagnosis not present

## 2020-09-06 DIAGNOSIS — D509 Iron deficiency anemia, unspecified: Secondary | ICD-10-CM | POA: Diagnosis not present

## 2020-09-06 DIAGNOSIS — D631 Anemia in chronic kidney disease: Secondary | ICD-10-CM | POA: Diagnosis not present

## 2020-09-06 DIAGNOSIS — N2581 Secondary hyperparathyroidism of renal origin: Secondary | ICD-10-CM | POA: Diagnosis not present

## 2020-09-06 DIAGNOSIS — N186 End stage renal disease: Secondary | ICD-10-CM | POA: Diagnosis not present

## 2020-09-06 DIAGNOSIS — T80211A Bloodstream infection due to central venous catheter, initial encounter: Secondary | ICD-10-CM | POA: Diagnosis not present

## 2020-09-06 MED ORDER — AMLODIPINE BESYLATE 2.5 MG PO TABS: 2.5000 mg | ORAL_TABLET | Freq: Every day | ORAL | 1 refills | Status: DC

## 2020-09-08 ENCOUNTER — Ambulatory Visit: Payer: Medicare Other

## 2020-09-08 DIAGNOSIS — D509 Iron deficiency anemia, unspecified: Secondary | ICD-10-CM | POA: Diagnosis not present

## 2020-09-08 DIAGNOSIS — D689 Coagulation defect, unspecified: Secondary | ICD-10-CM | POA: Diagnosis not present

## 2020-09-08 DIAGNOSIS — T80211A Bloodstream infection due to central venous catheter, initial encounter: Secondary | ICD-10-CM | POA: Diagnosis not present

## 2020-09-08 DIAGNOSIS — N186 End stage renal disease: Secondary | ICD-10-CM

## 2020-09-08 DIAGNOSIS — Z992 Dependence on renal dialysis: Secondary | ICD-10-CM | POA: Diagnosis not present

## 2020-09-08 DIAGNOSIS — D631 Anemia in chronic kidney disease: Secondary | ICD-10-CM | POA: Diagnosis not present

## 2020-09-08 DIAGNOSIS — N2581 Secondary hyperparathyroidism of renal origin: Secondary | ICD-10-CM | POA: Diagnosis not present

## 2020-09-08 NOTE — Chronic Care Management (AMB) (Signed)
  Chronic Care Management   Outreach Note  09/08/2020 Name: Whitney Walker MRN: [id number] DOB: 1956-05-07  Referred by: [name], FNP Reason for referral : Care Coordination   Successful outbound call placed to the patient in response to message received from pharmacy technician informing SW the patient would like assistance locating an alternative transportation vendor.   The patient discussed a bad transportation experience earlier this week involving her current transportation vendor "[employer]". The patient reports she has obtained a list of [employer] contracted vendors to review and determine if she would like to switch. The patient indicates at this time she plans to stay with [facility] with option to use "[employer]" or [place] for return trips when she is not feeling well.   Follow Up Plan: Collaboration with pharmacy team to provide an update on patients transportation plan. Encouraged the patient to contact SW as needed with future care coordination needs.  [name], BSW, CDP Social Worker, Certified Dementia Practitioner [place] [facility] [facility] [phone]

## 2020-09-08 NOTE — Chronic Care Management (AMB) (Signed)
Chronic Care Management Pharmacy Assistant   Name: Whitney Walker  MRN: [id number] DOB: [birth year]  Reason for Encounter: Patient Assistance Coordination/Medication Review  PCP : [name], FNP  Allergies:   Allergies  Allergen Reactions  . Carnosine     Other reaction(s): Unknown  . Gadolinium Derivatives Hives and Other (See Comments)    HIVES, Desc: HIVES W/ "DYE" USED FOR 1ST CT SCAN BUT NOT 2ND, NO PREMEDS USED, PT UNCERTAIN OF CIRCUMSTANCES,,?POSSIBLE MRI CONTRAST ALLERGY, ALL STUDIES DONE "SOMEWHERE" IN [place]//A.C., Onset Date: [id number]  . Iohexol Other (See Comments)     Code: HIVES, Desc: HIVES W/ "DYE" USED FOR 1ST CT SCAN BUT NOT 2ND, NO PREMEDS USED, PT UNCERTAIN OF CIRCUMSTANCES,,?POSSIBLE MRI CONTRAST ALLERGY, ALL STUDIES DONE "SOMEWHERE" IN [place]//A.C., Onset Date: [id number]   . Iodine Hives  . Naltrexone     Other reaction(s): Unknown    Medications: Outpatient Encounter Medications as of 09/06/2020  Medication Sig  . acetaminophen (TYLENOL) 500 MG tablet Take 1,000 mg by mouth 2 (two) times daily as needed for moderate pain or headache.  . albuterol (VENTOLIN HFA) 108 (90 Base) MCG/ACT inhaler Inhale 2 puffs into the lungs every 6 (six) hours as needed for wheezing or shortness of breath.  [name] apixaban (ELIQUIS) 2.5 MG TABS tablet Take 1 tablet (2.5 mg total) by mouth 2 (two) times daily.  . Ascorbic Acid (VITAMIN C) 100 MG tablet Take 100 mg by mouth daily.  . Biotin 10000 MCG TABS Take 10,000 mcg by mouth daily.   . carvedilol (COREG) 12.5 MG tablet Take 12.5 mg by mouth See admin instructions. Takes Twice daily except Only takes once a day on dialysis days (holds in morning on Monday Wednesday and Friday)  . eszopiclone (LUNESTA) 2 MG TABS tablet Take 1 tablet (2 mg total) by mouth at bedtime as needed for sleep. Take immediately before bedtime  . meclizine (ANTIVERT) 12.5 MG tablet Take 1 tablet (12.5 mg total) by mouth 3 (three) times  daily as needed for dizziness.  . multivitamin (RENA-VIT) TABS tablet Take 1 tablet by mouth at bedtime.  . mupirocin ointment (BACTROBAN) 2 % Apply 1 application topically daily as needed (skin bumps).  . nystatin (NYSTATIN) powder Apply 1 application topically 4 (four) times daily as needed.  . ondansetron (ZOFRAN ODT) 4 MG disintegrating tablet Take 1 tablet (4 mg total) by mouth every 6 (six) hours as needed. (Patient not taking: Reported on 08/03/2020)  . oxyCODONE-acetaminophen (PERCOCET/ROXICET) 5-325 MG tablet Take 2 tablets by mouth every 4 (four) hours as needed for severe pain. (Patient taking differently: Take 1 tablet by mouth every 4 (four) hours as needed for severe pain. )  . sevelamer carbonate (RENVELA) 800 MG tablet Take 3,200 mg by mouth 3 (three) times daily with meals. Take 4 tablets with each meal  . temazepam (RESTORIL) 15 MG capsule Take 1 capsule (15 mg total) by mouth at bedtime as needed for sleep. (Patient not taking: Reported on 08/03/2020)  . vitamin E (VITAMIN E) 200 UNIT capsule Take 200 Units by mouth daily.   No facility-administered encounter medications on file as of 09/06/2020.    Current Diagnosis: Patient Active Problem List   Diagnosis Date Noted  . Hyperkalemia 05/09/2020  . Acute respiratory failure with hypoxia ([place]) 05/09/2020  . Left leg pain 05/09/2020  . Restless leg syndrome 09/19/2018  . OSA (obstructive sleep apnea) 04/29/2018  . ESRD on dialysis ([place]) 04/29/2018  . Dyspnea on exertion 03/12/2017  .  Morbid (severe) obesity due to excess calories ([place]) 03/12/2017  . Anemia of chronic kidney failure, stage 3 (moderate) 12/10/2016  . History of colon cancer 08/12/2016  . Mass of left breast on mammogram 12/10/2015  . HTN (hypertension) 10/18/2015  . Depression 10/18/2015  . Numbness and tingling of left arm and leg 01/24/2015  . PVD (peripheral vascular disease) with claudication ([place]) 03/23/2014  . PAD (peripheral artery disease) ([place])  12/14/2013  . Chronic diastolic heart failure ([place]) 11/15/2013  . Essential hypertension 11/15/2013  . Leucocytosis 04/28/2012    Follow-Up:  Coordination of Enhanced Pharmacy Services, Patient Assistance Coordination and Pharmacist Review- Patient called with complaints with current transportation services- [place]. She would like to move forward with new transportation services mentioned from [place]. Patient stated she almost missed her dialysis treatment this morning due to transportation service not picking her up on time, she was almost 2 hours late. Informed patient that I will contact [name] to contact her to complete new transportation service. Also informed patient of new medication Amlodipine 2.5 mg that PCP- [name] would like for her to start to help lower her blood pressures from last weeks discussion with patient. Patient aware [facility] will deliver to her by 09/07/20 and I will contact again with further directions instructions for dialysis days. Jannette Fogo, CPP notified of conversation with patient and [name] notified of assistance follow up with transportation for patient.  Called [facility] per pharmacist to inquire on blood pressure readings during days of dialysis. Spoke with Will- RN, he stated on 09/06/2020 Pre-dialysis blood pressure standing was 209/117 and sitting was 140/124; Post-dialysis blood pressure standing was 104/68 and sitting 123/78. On 09/04/2020 Post-dialysis blood pressure standing was 107/74 and sitting was 124/105. Jannette Fogo, CPP made of aware of readings and will relay to PCP- [name] to determine further instructions on Amlodipine directions.  09/07/2020- Patient called back yesterday with follow up information regarding transportation services to dialysis, she states she is still not sure which service she will use, she has contact [place] and [facility] but [facility] she will have to pay  out of pocket and the [facility] program she has to be a patient of this service. Patient made aware the [name], CSW will contact her tomorrow with more information regarding transportation service. Patient also mentioned that she had a follow up appointment with [name], FNP on 09/20/2020 that she can not make because it is on her dialysis days, she cancelled via Mychart and rescheduled to 09/26/2020. Patient still does not know if she need to come in that soon and requested scheduling office to contact her. While on the phone with patient I informed her that she should continue to check and record her blood pressures daily and to start the Amlodipine 2.5 mg taking every day including dialysis days. Patient aware Jannette Fogo, CPP notified of appointment request. Pharmacist will contact patient.  [name], [place] Pharmacist Assistant [phone]

## 2020-09-11 DIAGNOSIS — Z992 Dependence on renal dialysis: Secondary | ICD-10-CM | POA: Diagnosis not present

## 2020-09-11 DIAGNOSIS — D509 Iron deficiency anemia, unspecified: Secondary | ICD-10-CM | POA: Diagnosis not present

## 2020-09-11 DIAGNOSIS — N2581 Secondary hyperparathyroidism of renal origin: Secondary | ICD-10-CM | POA: Diagnosis not present

## 2020-09-11 DIAGNOSIS — D631 Anemia in chronic kidney disease: Secondary | ICD-10-CM | POA: Diagnosis not present

## 2020-09-11 DIAGNOSIS — N186 End stage renal disease: Secondary | ICD-10-CM | POA: Diagnosis not present

## 2020-09-11 DIAGNOSIS — D689 Coagulation defect, unspecified: Secondary | ICD-10-CM | POA: Diagnosis not present

## 2020-09-11 DIAGNOSIS — U071 COVID-19: Secondary | ICD-10-CM | POA: Diagnosis not present

## 2020-09-11 DIAGNOSIS — T80211A Bloodstream infection due to central venous catheter, initial encounter: Secondary | ICD-10-CM | POA: Diagnosis not present

## 2020-09-12 ENCOUNTER — Telehealth: Payer: Self-pay

## 2020-09-13 DIAGNOSIS — Z743 Need for continuous supervision: Secondary | ICD-10-CM | POA: Diagnosis not present

## 2020-09-13 DIAGNOSIS — Z992 Dependence on renal dialysis: Secondary | ICD-10-CM | POA: Diagnosis not present

## 2020-09-13 DIAGNOSIS — R279 Unspecified lack of coordination: Secondary | ICD-10-CM | POA: Diagnosis not present

## 2020-09-13 DIAGNOSIS — R0902 Hypoxemia: Secondary | ICD-10-CM | POA: Diagnosis not present

## 2020-09-13 DIAGNOSIS — T80211A Bloodstream infection due to central venous catheter, initial encounter: Secondary | ICD-10-CM | POA: Diagnosis not present

## 2020-09-13 DIAGNOSIS — D689 Coagulation defect, unspecified: Secondary | ICD-10-CM | POA: Diagnosis not present

## 2020-09-13 DIAGNOSIS — N2581 Secondary hyperparathyroidism of renal origin: Secondary | ICD-10-CM | POA: Diagnosis not present

## 2020-09-13 DIAGNOSIS — D509 Iron deficiency anemia, unspecified: Secondary | ICD-10-CM | POA: Diagnosis not present

## 2020-09-13 DIAGNOSIS — U071 COVID-19: Secondary | ICD-10-CM | POA: Diagnosis not present

## 2020-09-13 DIAGNOSIS — D631 Anemia in chronic kidney disease: Secondary | ICD-10-CM | POA: Diagnosis not present

## 2020-09-13 DIAGNOSIS — R5381 Other malaise: Secondary | ICD-10-CM | POA: Diagnosis not present

## 2020-09-13 DIAGNOSIS — N186 End stage renal disease: Secondary | ICD-10-CM | POA: Diagnosis not present

## 2020-09-13 NOTE — Chronic Care Management (AMB) (Signed)
Chronic Care Management Pharmacy Assistant   Name: Whitney Walker  MRN: [id number] DOB: [birth year]  Reason for Encounter: Medication Review / Hypertension Follow up  PCP : [name], FNP  Allergies:   Allergies  Allergen Reactions  . Carnosine     Other reaction(s): Unknown  . Gadolinium Derivatives Hives and Other (See Comments)    HIVES, Desc: HIVES W/ "DYE" USED FOR 1ST CT SCAN BUT NOT 2ND, NO PREMEDS USED, PT UNCERTAIN OF CIRCUMSTANCES,,?POSSIBLE MRI CONTRAST ALLERGY, ALL STUDIES DONE "SOMEWHERE" IN [place]//A.C., Onset Date: [id number]  . Iohexol Other (See Comments)     Code: HIVES, Desc: HIVES W/ "DYE" USED FOR 1ST CT SCAN BUT NOT 2ND, NO PREMEDS USED, PT UNCERTAIN OF CIRCUMSTANCES,,?POSSIBLE MRI CONTRAST ALLERGY, ALL STUDIES DONE "SOMEWHERE" IN [place]//A.C., Onset Date: [id number]   . Iodine Hives  . Naltrexone     Other reaction(s): Unknown    Medications: Outpatient Encounter Medications as of 09/12/2020  Medication Sig  . acetaminophen (TYLENOL) 500 MG tablet Take 1,000 mg by mouth 2 (two) times daily as needed for moderate pain or headache.  . albuterol (VENTOLIN HFA) 108 (90 Base) MCG/ACT inhaler Inhale 2 puffs into the lungs every 6 (six) hours as needed for wheezing or shortness of breath.  [name] amLODipine (NORVASC) 2.5 MG tablet Take 1 tablet (2.5 mg total) by mouth daily.  [name] apixaban (ELIQUIS) 2.5 MG TABS tablet Take 1 tablet (2.5 mg total) by mouth 2 (two) times daily.  . Ascorbic Acid (VITAMIN C) 100 MG tablet Take 100 mg by mouth daily.  . Biotin 10000 MCG TABS Take 10,000 mcg by mouth daily.   . carvedilol (COREG) 12.5 MG tablet Take 12.5 mg by mouth See admin instructions. Takes Twice daily except Only takes once a day on dialysis days (holds in morning on Monday Wednesday and Friday)  . eszopiclone (LUNESTA) 2 MG TABS tablet Take 1 tablet (2 mg total) by mouth at bedtime as needed for sleep. Take immediately before bedtime  . meclizine  (ANTIVERT) 12.5 MG tablet Take 1 tablet (12.5 mg total) by mouth 3 (three) times daily as needed for dizziness.  . multivitamin (RENA-VIT) TABS tablet Take 1 tablet by mouth at bedtime.  . mupirocin ointment (BACTROBAN) 2 % Apply 1 application topically daily as needed (skin bumps).  . nystatin (NYSTATIN) powder Apply 1 application topically 4 (four) times daily as needed.  . ondansetron (ZOFRAN ODT) 4 MG disintegrating tablet Take 1 tablet (4 mg total) by mouth every 6 (six) hours as needed. (Patient not taking: Reported on 08/03/2020)  . oxyCODONE-acetaminophen (PERCOCET/ROXICET) 5-325 MG tablet Take 2 tablets by mouth every 4 (four) hours as needed for severe pain. (Patient taking differently: Take 1 tablet by mouth every 4 (four) hours as needed for severe pain. )  . sevelamer carbonate (RENVELA) 800 MG tablet Take 3,200 mg by mouth 3 (three) times daily with meals. Take 4 tablets with each meal  . temazepam (RESTORIL) 15 MG capsule Take 1 capsule (15 mg total) by mouth at bedtime as needed for sleep. (Patient not taking: Reported on 08/03/2020)  . vitamin E (VITAMIN E) 200 UNIT capsule Take 200 Units by mouth daily.   No facility-administered encounter medications on file as of 09/12/2020.    Current Diagnosis: Patient Active Problem List   Diagnosis Date Noted  . Hyperkalemia 05/09/2020  . Acute respiratory failure with hypoxia ([place]) 05/09/2020  . Left leg pain 05/09/2020  . Restless leg syndrome 09/19/2018  . OSA (obstructive  sleep apnea) 04/29/2018  . ESRD on dialysis ([place]) 04/29/2018  . Dyspnea on exertion 03/12/2017  . Morbid (severe) obesity due to excess calories ([place]) 03/12/2017  . Anemia of chronic kidney failure, stage 3 (moderate) 12/10/2016  . History of colon cancer 08/12/2016  . Mass of left breast on mammogram 12/10/2015  . HTN (hypertension) 10/18/2015  . Depression 10/18/2015  . Numbness and tingling of left arm and leg 01/24/2015  . PVD (peripheral vascular disease)  with claudication ([place]) 03/23/2014  . PAD (peripheral artery disease) ([place]) 12/14/2013  . Chronic diastolic heart failure ([place]) 11/15/2013  . Essential hypertension 11/15/2013  . Leucocytosis 04/28/2012    09/12/2020- Patient called with questions regarding new blood pressure medication added, Amlodipine 2.5 mg. She was wondering if she should be taking everyday even on dialysis days. Informed that per [name], FNP and Jannette Fogo CPP she should take Amlodipine 2.5 mg daily, even on dialysis days. Patient aware and understands. While on the phone with the patient she mentioned not feeling well, started on Saturday with chills and vomiting but no fever. When she went to have dialysis treatment Monday morning, she was sent home, low grade fever 99.6, ? COVID. Patient was sent home by ambulance and was brought back to the Kidney center that evening at 6 pm for dialysis treatment and to be tested for COVID. Patient states she should get the results back by Wednesday and this will determine whether or not she will need to do another late treatment while other patients are not in the office. Patient states she is feeling better, still weak and has no appetitive but will try to eat low sodium broth and drink plenty of water or Gatorade. Patient aware that I will follow up with her on Thursday, to continue to take Amlodipine daily and check blood pressure daily and record.  09/14/2020- Called patient to follow up on hypertension adherence. Patient needed to call me back due to daughter taking to the store. Patient did mention that they had to do another COVID test on her 09/13/20 and gave dialysis treatment at 6pm that day. No results yet.  09/15/2020- Patient called back, disease state follow up performed with patient regarding BP.  Recent Office Vitals: BP Readings from Last 3 Encounters:  08/03/20 (!) 190/84  08/03/20 (!) 190/84  07/24/20 (!) 193/90   Pulse Readings from Last 3 Encounters:    08/03/20 80  08/03/20 80  07/24/20 95    Wt Readings from Last 3 Encounters:  08/03/20 200 lb (90.7 kg)  08/03/20 200 lb (90.7 kg)  07/24/20 196 lb (88.9 kg)     Kidney Function Lab Results  Component Value Date/Time   CREATININE 6.98 (HH) 05/18/2020 10:36 AM   CREATININE 8.44 (H) 05/10/2020 06:31 AM   CREATININE 3.4 (HH) 07/07/2017 12:35 PM   CREATININE 3.4 Repeated and Verified ([place]) 01/03/2017 09:29 AM   GFRNONAA 6 (L) 05/18/2020 10:36 AM   GFRAA 7 (L) 05/18/2020 10:36 AM    BMP Latest Ref Rng & Units 05/18/2020 05/10/2020 05/09/2020  Glucose 70 - 99 mg/dL 99 100(H) 71  BUN 8 - 23 mg/dL 38(H) 35(H) 19  Creatinine 0.44 - 1.00 mg/dL 6.98(HH) 8.44(H) 5.80(H)  Sodium 135 - 145 mmol/L 133(L) 135 136  Potassium 3.5 - 5.1 mmol/L 5.1 5.1 4.8  Chloride 98 - 111 mmol/L 96(L) 97(L) 96(L)  CO2 22 - 32 mmol/L 27 25 26   Calcium 8.9 - 10.3 mg/dL 9.9 9.1 9.1    . Current  antihypertensive regimen:  o Amlodipine 2.5 mg 1 tablet daily o Carvedilol 12.5 mg 1 tablet twice daily except on dialysis days(holds morning tablet on Monday, Wednesday and Friday) . How often are you checking your Blood Pressure? twice daily . Current home BP readings: 169/104, 163/79, 157/88, 159/81. Patient states her blood pressure are always lower after dialysis treatments, no readings to document at this time. . What recent interventions/DTPs have been made by any provider to improve Blood Pressure control since last CPP Visit: Adding Amlodipine 2.5 mg daily. . Any recent hospitalizations or ED visits since last visit with CPP? No . What diet changes have been made to improve Blood Pressure Control? None discussed. . What exercise is being done to improve your Blood Pressure Control?  o Patient is unable to do much exercise.  Adherence Review: Is the patient currently on ACE/ARB medication? No Does the patient have >5 day gap between last estimated fill dates? No   Follow-Up:  Care Coordination with Outside  Provider and Pharmacist Review- Jannette Fogo, CPP aware of patient symptoms and questions and follow up intended.  09/15/2020- Spoke with patient to follow up on hypertension adherence, disease state call completed. Patient is taking Amlodipine daily she notices her blood pressure a little lower in the mornings after starting Amlodipine, not over 200's anymore. Patient mentioned that her dialysis treatment times on Monday, Wednesday and Fridays were changed to an hour later and patient was not happy about this due to the existing transportation issues she already has. Patient will discuss more with Dialysis Center but is only wanting to go on Wednesdays if the timing is going to make it worse for transportation.  Patient also mentioned that she would not like to receive any calls from [place] or CCM social worker- [name], only from [place] and/or assistant. Patient understands the benefits of Chronic care management but only wants to utilize the pharmacy program. Jannette Fogo, CPP aware of all patient discussion and complaints.  Patient also requested needing help with getting an application mailed to her for a street sign placement [facility]). Patient mentioned that she had to be walked across the street to be put into an ambulance due to there not being a street sign in front of her house and the ambulance was unable to park in front. Patient aware that I will look into finding this application and mail to her.  09/27/20- Patient called [facility] inquiring about Amoxicillin 500 mg that was prescribed 09/26/2020 by PCP- Janece Moore,FNP. Medication delivered today and patient is wondering if she still needs to take this medication. Spoke with pharmacist Jannette Fogo who discuss with PCP that she should take Amoxicillin today and on days of dialysis. Patient then mentioned that at dialysis this morning they treated her with an IV antibiotics  for possible catheter infection. Patient stated that she didn't feel like her catheter site was infected, it was swollen but no oozing, they took a wound culture of site. She is not sure what IV antibiotic she was given. Patient aware that I will discuss with nurse at the Dialysis Clinic to get more information, informed to take Amoxicillin today per PCP but patient would like to wait until tomorrow, she feels more comfortable with waiting, not knowing what they gave her at the kidney center. Patient also mentioned she is going for a chest xray tomorrow. Patent aware that I will follow up with her tomorrow to check her status and medication  review. Jannette Fogo, CPP aware.  09/28/2020- Patient called requesting refill of Amlodipine to be delivered by [facility] due to PCP- [name], FNP increasing to 5 mg taking 1 tablet daily, patient is using all of 2.5 mg taking 2 tablets daily and will run out on Sunday. [facility] notified of refill request, patient will receive medication today.  [place], spoke with nurse Will, he states they gave Mrs Dethlefs Vancomycin 1.5 g IV yesterday for possible catheter infection, he said her catheter site was swollen up to her neck and painful. They tried to give her Ceftiazidine 2 g IV after treatment but patient declined due to time. Blood and wound cultures were sent. Jannette Fogo, CPP notified.  Called patient back to make sure she took antibiotic Amoxicillin 500 mg today, patient states she took one dose this morning and will take another dose tonight. Patient states she did not receive the Amlodipine new prescription yet. Informed patient that she should hear from them tomorrow morning if not today. Patient states she did have her CXR done today, waiting on results, she is still waiting on results from the wound and blood culture. Patient states her blood pressures are doing better, reading today was 149/88. While on the phone with  the patient, [facility] delivery driver called and arrived to the patient's house with Amlodipine 5 mg. Patient was very Patent attorney. Jannette Fogo, CPP notified.  [name], [place] Pharmacist Assistant [phone]

## 2020-09-14 ENCOUNTER — Ambulatory Visit: Payer: Medicare Other

## 2020-09-14 DIAGNOSIS — I10 Essential (primary) hypertension: Secondary | ICD-10-CM

## 2020-09-14 DIAGNOSIS — N186 End stage renal disease: Secondary | ICD-10-CM

## 2020-09-14 NOTE — Chronic Care Management (AMB) (Signed)
Chronic Care Management    Social Work Follow Up Note  09/14/2020 Name: Whitney Walker MRN: [id number] DOB: 1956-08-27  Whitney Walker is a 64 y.o. year old female who is a primary care patient of [name], [place]. The CCM team was consulted for assistance with care coordination.   Review of patient status, including review of consultants reports, other relevant assessments, and collaboration with appropriate care team members and the patient's provider was performed as part of comprehensive patient evaluation and provision of chronic care management services.    SW placed a successful outbound call to the patient in a response to a voice message received. The patient requested SW help in turning street parking in front of her home into handicap parking due to cars parking in spaces where she would prefer to have transportation pick her up for dialysis. SW advised the patient her primary provider is happy to complete an application for a handicap placard but that they are unable to change street parking into designated handicap spaces. The patient stated "I have a handicap placard already I will just seek help somewhere else". SW advised the patient to speak with her dialysis caseworker for assistance regarding transportation concerns to and from dialysis.    Outpatient Encounter Medications as of 09/14/2020  Medication Sig  . acetaminophen (TYLENOL) 500 MG tablet Take 1,000 mg by mouth 2 (two) times daily as needed for moderate pain or headache.  . albuterol (VENTOLIN HFA) 108 (90 Base) MCG/ACT inhaler Inhale 2 puffs into the lungs every 6 (six) hours as needed for wheezing or shortness of breath.  [name] amLODipine (NORVASC) 2.5 MG tablet Take 1 tablet (2.5 mg total) by mouth daily.  [name] apixaban (ELIQUIS) 2.5 MG TABS tablet Take 1 tablet (2.5 mg total) by mouth 2 (two) times daily.  . Ascorbic Acid (VITAMIN C) 100 MG tablet Take 100 mg by mouth daily.  . Biotin 10000 MCG TABS Take 10,000  mcg by mouth daily.   . carvedilol (COREG) 12.5 MG tablet Take 12.5 mg by mouth See admin instructions. Takes Twice daily except Only takes once a day on dialysis days (holds in morning on Monday Wednesday and Friday)  . eszopiclone (LUNESTA) 2 MG TABS tablet Take 1 tablet (2 mg total) by mouth at bedtime as needed for sleep. Take immediately before bedtime  . meclizine (ANTIVERT) 12.5 MG tablet Take 1 tablet (12.5 mg total) by mouth 3 (three) times daily as needed for dizziness.  . multivitamin (RENA-VIT) TABS tablet Take 1 tablet by mouth at bedtime.  . mupirocin ointment (BACTROBAN) 2 % Apply 1 application topically daily as needed (skin bumps).  . nystatin (NYSTATIN) powder Apply 1 application topically 4 (four) times daily as needed.  . ondansetron (ZOFRAN ODT) 4 MG disintegrating tablet Take 1 tablet (4 mg total) by mouth every 6 (six) hours as needed. (Patient not taking: Reported on 08/03/2020)  . oxyCODONE-acetaminophen (PERCOCET/ROXICET) 5-325 MG tablet Take 2 tablets by mouth every 4 (four) hours as needed for severe pain. (Patient taking differently: Take 1 tablet by mouth every 4 (four) hours as needed for severe pain. )  . sevelamer carbonate (RENVELA) 800 MG tablet Take 3,200 mg by mouth 3 (three) times daily with meals. Take 4 tablets with each meal  . temazepam (RESTORIL) 15 MG capsule Take 1 capsule (15 mg total) by mouth at bedtime as needed for sleep. (Patient not taking: Reported on 08/03/2020)  . vitamin E (VITAMIN E) 200 UNIT capsule Take 200 Units  by mouth daily.   No facility-administered encounter medications on file as of 09/14/2020.     Follow Up Plan: No SW follow up planned at this time. The patient will remain active with RN Care Manager and PharmD.   [name], BSW, CDP Social Worker, Certified Dementia Practitioner [place] [facility] [place] [facility] [phone]

## 2020-09-15 DIAGNOSIS — Z992 Dependence on renal dialysis: Secondary | ICD-10-CM | POA: Diagnosis not present

## 2020-09-15 DIAGNOSIS — N2581 Secondary hyperparathyroidism of renal origin: Secondary | ICD-10-CM | POA: Diagnosis not present

## 2020-09-15 DIAGNOSIS — N186 End stage renal disease: Secondary | ICD-10-CM | POA: Diagnosis not present

## 2020-09-15 DIAGNOSIS — D631 Anemia in chronic kidney disease: Secondary | ICD-10-CM | POA: Diagnosis not present

## 2020-09-15 DIAGNOSIS — D689 Coagulation defect, unspecified: Secondary | ICD-10-CM | POA: Diagnosis not present

## 2020-09-15 DIAGNOSIS — D509 Iron deficiency anemia, unspecified: Secondary | ICD-10-CM | POA: Diagnosis not present

## 2020-09-15 DIAGNOSIS — T80211A Bloodstream infection due to central venous catheter, initial encounter: Secondary | ICD-10-CM | POA: Diagnosis not present

## 2020-09-18 DIAGNOSIS — D631 Anemia in chronic kidney disease: Secondary | ICD-10-CM | POA: Diagnosis not present

## 2020-09-18 DIAGNOSIS — N186 End stage renal disease: Secondary | ICD-10-CM | POA: Diagnosis not present

## 2020-09-18 DIAGNOSIS — D509 Iron deficiency anemia, unspecified: Secondary | ICD-10-CM | POA: Diagnosis not present

## 2020-09-18 DIAGNOSIS — T80211A Bloodstream infection due to central venous catheter, initial encounter: Secondary | ICD-10-CM | POA: Diagnosis not present

## 2020-09-18 DIAGNOSIS — Z992 Dependence on renal dialysis: Secondary | ICD-10-CM | POA: Diagnosis not present

## 2020-09-18 DIAGNOSIS — D689 Coagulation defect, unspecified: Secondary | ICD-10-CM | POA: Diagnosis not present

## 2020-09-18 DIAGNOSIS — N2581 Secondary hyperparathyroidism of renal origin: Secondary | ICD-10-CM | POA: Diagnosis not present

## 2020-09-20 ENCOUNTER — Ambulatory Visit: Payer: Medicare Other | Admitting: Nurse Practitioner

## 2020-09-20 DIAGNOSIS — D631 Anemia in chronic kidney disease: Secondary | ICD-10-CM | POA: Diagnosis not present

## 2020-09-20 DIAGNOSIS — N2581 Secondary hyperparathyroidism of renal origin: Secondary | ICD-10-CM | POA: Diagnosis not present

## 2020-09-20 DIAGNOSIS — N186 End stage renal disease: Secondary | ICD-10-CM | POA: Diagnosis not present

## 2020-09-20 DIAGNOSIS — T80211A Bloodstream infection due to central venous catheter, initial encounter: Secondary | ICD-10-CM | POA: Diagnosis not present

## 2020-09-20 DIAGNOSIS — Z992 Dependence on renal dialysis: Secondary | ICD-10-CM | POA: Diagnosis not present

## 2020-09-20 DIAGNOSIS — D689 Coagulation defect, unspecified: Secondary | ICD-10-CM | POA: Diagnosis not present

## 2020-09-20 DIAGNOSIS — D509 Iron deficiency anemia, unspecified: Secondary | ICD-10-CM | POA: Diagnosis not present

## 2020-09-21 DIAGNOSIS — H25813 Combined forms of age-related cataract, bilateral: Secondary | ICD-10-CM | POA: Diagnosis not present

## 2020-09-22 DIAGNOSIS — D509 Iron deficiency anemia, unspecified: Secondary | ICD-10-CM | POA: Diagnosis not present

## 2020-09-22 DIAGNOSIS — Z992 Dependence on renal dialysis: Secondary | ICD-10-CM | POA: Diagnosis not present

## 2020-09-22 DIAGNOSIS — D631 Anemia in chronic kidney disease: Secondary | ICD-10-CM | POA: Diagnosis not present

## 2020-09-22 DIAGNOSIS — N2581 Secondary hyperparathyroidism of renal origin: Secondary | ICD-10-CM | POA: Diagnosis not present

## 2020-09-22 DIAGNOSIS — T80211A Bloodstream infection due to central venous catheter, initial encounter: Secondary | ICD-10-CM | POA: Diagnosis not present

## 2020-09-22 DIAGNOSIS — N186 End stage renal disease: Secondary | ICD-10-CM | POA: Diagnosis not present

## 2020-09-22 DIAGNOSIS — D689 Coagulation defect, unspecified: Secondary | ICD-10-CM | POA: Diagnosis not present

## 2020-09-25 DIAGNOSIS — N186 End stage renal disease: Secondary | ICD-10-CM | POA: Diagnosis not present

## 2020-09-25 DIAGNOSIS — D509 Iron deficiency anemia, unspecified: Secondary | ICD-10-CM | POA: Diagnosis not present

## 2020-09-25 DIAGNOSIS — Z992 Dependence on renal dialysis: Secondary | ICD-10-CM | POA: Diagnosis not present

## 2020-09-25 DIAGNOSIS — D689 Coagulation defect, unspecified: Secondary | ICD-10-CM | POA: Diagnosis not present

## 2020-09-25 DIAGNOSIS — N2581 Secondary hyperparathyroidism of renal origin: Secondary | ICD-10-CM | POA: Diagnosis not present

## 2020-09-25 DIAGNOSIS — D631 Anemia in chronic kidney disease: Secondary | ICD-10-CM | POA: Diagnosis not present

## 2020-09-25 DIAGNOSIS — T80211A Bloodstream infection due to central venous catheter, initial encounter: Secondary | ICD-10-CM | POA: Diagnosis not present

## 2020-09-26 ENCOUNTER — Ambulatory Visit (INDEPENDENT_AMBULATORY_CARE_PROVIDER_SITE_OTHER): Payer: Medicare Other | Admitting: Nurse Practitioner

## 2020-09-26 ENCOUNTER — Encounter: Payer: Self-pay | Admitting: Nurse Practitioner

## 2020-09-26 ENCOUNTER — Other Ambulatory Visit: Payer: Self-pay

## 2020-09-26 VITALS — BP 120/82 | HR 90 | Temp 98.7°F | Ht 61.8 in | Wt 204.0 lb

## 2020-09-26 DIAGNOSIS — R059 Cough, unspecified: Secondary | ICD-10-CM

## 2020-09-26 DIAGNOSIS — I10 Essential (primary) hypertension: Secondary | ICD-10-CM | POA: Diagnosis not present

## 2020-09-26 DIAGNOSIS — R05 Cough: Secondary | ICD-10-CM

## 2020-09-26 MED ORDER — AMLODIPINE BESYLATE 5 MG PO TABS: 2.5000 mg | ORAL_TABLET | Freq: Every day | ORAL | 1 refills | Status: DC

## 2020-09-26 MED ORDER — AMOXICILLIN 500 MG PO TABS: 500.0000 mg | ORAL_TABLET | Freq: Two times a day (BID) | ORAL | 0 refills | Status: DC

## 2020-09-26 NOTE — Progress Notes (Signed)
I,Yamilka Roman [employer] as a Education administrator for [employer], FNP.,have documented all relevant documentation on the behalf of [name], FNP,as directed by  [name], FNP while in the presence of [name], [place]. This visit occurred during the SARS-CoV-2 public health emergency.  Safety protocols were in place, including screening questions prior to the visit, additional usage of staff PPE, and extensive cleaning of exam room while observing appropriate contact time as indicated for disinfecting solutions.  Subjective:     Patient ID: Whitney Walker , female    DOB: 03-07-1956 , 64 y.o.   MRN: [id number]   Chief Complaint  Patient presents with  . Hypertension    HPI  She reports her blood pressure is still high at home and dialysis - she is taking amlodipine 2.5 mg once a day even on dialysis days.  She is also taking carvedilol 12.5 mg twice a day even on dialysis days. She reports the blood pressure is elevated when she goes to dialysis then it will go down.  The last 2 days has been lower to 301 systolic.  She reports at home the blood pressure was in the 190's as well.  She denies being stressed when she goes to dialysis.  If her dialysis is high when she goes in they have to get permission to start dialysis.  Then at times she will bottom her blood pressure and has to get it raised back up.  She has the dialysis at [facility] - sometimes when get to dialysis will be elevated again    She is having swelling to right vas cath area - was having problems with clotting.  She is having this managed at dialysis    Past Medical History:  Diagnosis Date  . Anemia   . Anxiety   . CHF (congestive heart failure) ([place])   . Colon cancer [facility])    treatment surgery  . Complication of anesthesia    after first C- [place] "couldnt walk after", patient denies having a spinal  . COPD (chronic obstructive pulmonary disease) ([place])   . Coronary artery disease   . Depression   . DVT (deep venous  thrombosis) ([place])   . ESRD (end stage renal disease) ([place])    Hemo: MWF  . History of blood transfusion 04/2018  . Hypertension    07/07/18- no longer takes BP medications  . Meningitis   . Pain in limb 07/30/2013  . PE (pulmonary embolism)   . Peripheral vascular disease ([place])   . Restless legs   . Shortness of breath    with exertion  . Sleep apnea   . SOB (shortness of breath) 03/03/2019  . Vertigo      Family History  Problem Relation Age of Onset  . Cancer Mother 64       breast and bone  . Cancer Father 34       prostate  . Hypertension Sister   . Bleeding Disorder Sister   . Cancer Cousin 20       breast cancer   . Hypertension Daughter   . Breast cancer Neg Hx      Current Outpatient Medications:  .  acetaminophen (TYLENOL) 500 MG tablet, Take 1,000 mg by mouth 2 (two) times daily as needed for moderate pain or headache., Disp: , Rfl:  .  albuterol (VENTOLIN HFA) 108 (90 Base) MCG/ACT inhaler, Inhale 2 puffs into the lungs every 6 (six) hours as needed for wheezing or shortness of breath., Disp: 18  g, Rfl: 0 .  apixaban (ELIQUIS) 2.5 MG TABS tablet, Take 1 tablet (2.5 mg total) by mouth 2 (two) times daily., Disp: 180 tablet, Rfl: 3 .  Ascorbic Acid (VITAMIN C) 100 MG tablet, Take 100 mg by mouth daily., Disp: , Rfl:  .  Biotin 10000 MCG TABS, Take 10,000 mcg by mouth daily. , Disp: , Rfl:  .  carvedilol (COREG) 12.5 MG tablet, Take 12.5 mg by mouth See admin instructions. Takes Twice daily except Only takes once a day on dialysis days (holds in morning on Monday Wednesday and Friday), Disp: , Rfl:  .  meclizine (ANTIVERT) 12.5 MG tablet, Take 1 tablet (12.5 mg total) by mouth 3 (three) times daily as needed for dizziness., Disp: 30 tablet, Rfl: 0 .  multivitamin (RENA-VIT) TABS tablet, Take 1 tablet by mouth at bedtime., Disp: , Rfl:  .  mupirocin ointment (BACTROBAN) 2 %, Apply 1 application topically daily as needed (skin bumps)., Disp: 22 g, Rfl: 2 .  nystatin  (NYSTATIN) powder, Apply 1 application topically 4 (four) times daily as needed., Disp: 30 g, Rfl: 1 .  oxyCODONE-acetaminophen (PERCOCET/ROXICET) 5-325 MG tablet, Take 2 tablets by mouth every 4 (four) hours as needed for severe pain. (Patient taking differently: Take 1 tablet by mouth every 4 (four) hours as needed for severe pain. ), Disp: 16 tablet, Rfl: 0 .  sevelamer carbonate (RENVELA) 800 MG tablet, Take 3,200 mg by mouth 3 (three) times daily with meals. Take 4 tablets with each meal, Disp: , Rfl:  .  vitamin E (VITAMIN E) 200 UNIT capsule, Take 200 Units by mouth daily., Disp: , Rfl:  .  amLODipine (NORVASC) 5 MG tablet, Take 1 tablet (5 mg total) by mouth daily., Disp: 90 tablet, Rfl: 1 .  amoxicillin (AMOXIL) 500 MG tablet, Take 1 tablet (500 mg total) by mouth 2 (two) times daily., Disp: 14 tablet, Rfl: 0 .  eszopiclone (LUNESTA) 2 MG TABS tablet, Take 1 tablet (2 mg total) by mouth at bedtime as needed for sleep. Take immediately before bedtime (Patient not taking: Reported on 09/26/2020), Disp: 30 tablet, Rfl: 1 .  ondansetron (ZOFRAN ODT) 4 MG disintegrating tablet, Take 1 tablet (4 mg total) by mouth every 6 (six) hours as needed. (Patient not taking: Reported on 08/03/2020), Disp: 20 tablet, Rfl: 0 .  temazepam (RESTORIL) 15 MG capsule, Take 1 capsule (15 mg total) by mouth at bedtime as needed for sleep. (Patient not taking: Reported on 08/03/2020), Disp: 30 capsule, Rfl: 2   Allergies  Allergen Reactions  . Carnosine     Other reaction(s): Unknown  . Gadolinium Derivatives Hives and Other (See Comments)    HIVES, Desc: HIVES W/ "DYE" USED FOR 1ST CT SCAN BUT NOT 2ND, NO PREMEDS USED, PT UNCERTAIN OF CIRCUMSTANCES,,?POSSIBLE MRI CONTRAST ALLERGY, ALL STUDIES DONE "SOMEWHERE" IN [place]//A.C., Onset Date: 12458099  . Iohexol Other (See Comments)     Code: HIVES, Desc: HIVES W/ "DYE" USED FOR 1ST CT SCAN BUT NOT 2ND, NO PREMEDS USED, PT UNCERTAIN OF CIRCUMSTANCES,,?POSSIBLE MRI  CONTRAST ALLERGY, ALL STUDIES DONE "SOMEWHERE" IN [place]//A.C., Onset Date: [id number]   . Iodine Hives  . Naltrexone     Other reaction(s): Unknown     Review of Systems  Constitutional: Negative.   Respiratory: Negative.   Cardiovascular: Negative.  Negative for chest pain, palpitations and leg swelling.  Endocrine: Negative for polydipsia, polyphagia and polyuria.  Neurological: Negative for dizziness and headaches (occasionally).  Psychiatric/Behavioral: Negative.  Today's Vitals   09/26/20 1519  BP: 120/82  Pulse: 90  Temp: 98.7 F (37.1 C)  TempSrc: Oral  Weight: 204 lb (92.5 kg)  Height: 5' 1.8" (1.57 m)  PainSc: 8   PainLoc: Neck   Body mass index is 37.55 kg/m.   Objective:  Physical Exam Constitutional:      General: She is not in acute distress.    Appearance: Normal appearance.  Cardiovascular:     Pulses: Normal pulses.     Heart sounds: Normal heart sounds. No murmur heard.   Pulmonary:     Effort: Pulmonary effort is normal. No respiratory distress.     Breath sounds: Normal breath sounds.  Skin:    General: Skin is warm and dry.     Capillary Refill: Capillary refill takes less than 2 seconds.  Neurological:     General: No focal deficit present.     Mental Status: She is alert and oriented to person, place, and time.     Cranial Nerves: No cranial nerve deficit.  Psychiatric:        Mood and Affect: Mood normal.        Behavior: Behavior normal.        Thought Content: Thought content normal.        Judgment: Judgment normal.         Assessment And Plan:     1. Essential hypertension  Her blood pressure today is normal however due to the elevated blood pressure at dialysis we will start increase her amlodipine to 5 mg daily to take prior to dialysis to help with blood pressure control  2. Cough  She has a moist cough, I will refer her for a chest xray for further evaluation. I will also treat her empirically with an  antibiotic due to having a history of pneumonia in the past.  - DG Chest 2 View; Future - amoxicillin (AMOXIL) 500 MG tablet; Take 1 tablet (500 mg total) by mouth 2 (two) times daily.  Dispense: 14 tablet; Refill: 0     Patient was given opportunity to ask questions. Patient verbalized understanding of the plan and was able to repeat key elements of the plan. All questions were answered to their satisfaction.   Teola Bradley, FNP, have reviewed all documentation for this visit. The documentation on 10/08/20 for the exam, diagnosis, procedures, and orders are all accurate and complete.  THE PATIENT IS ENCOURAGED TO PRACTICE SOCIAL DISTANCING DUE TO THE COVID-19 PANDEMIC.

## 2020-09-27 DIAGNOSIS — D509 Iron deficiency anemia, unspecified: Secondary | ICD-10-CM | POA: Diagnosis not present

## 2020-09-27 DIAGNOSIS — D689 Coagulation defect, unspecified: Secondary | ICD-10-CM | POA: Diagnosis not present

## 2020-09-27 DIAGNOSIS — N186 End stage renal disease: Secondary | ICD-10-CM | POA: Diagnosis not present

## 2020-09-27 DIAGNOSIS — N2581 Secondary hyperparathyroidism of renal origin: Secondary | ICD-10-CM | POA: Diagnosis not present

## 2020-09-27 DIAGNOSIS — T80211A Bloodstream infection due to central venous catheter, initial encounter: Secondary | ICD-10-CM | POA: Diagnosis not present

## 2020-09-27 DIAGNOSIS — D631 Anemia in chronic kidney disease: Secondary | ICD-10-CM | POA: Diagnosis not present

## 2020-09-27 DIAGNOSIS — Z992 Dependence on renal dialysis: Secondary | ICD-10-CM | POA: Diagnosis not present

## 2020-09-28 ENCOUNTER — Other Ambulatory Visit: Payer: Self-pay

## 2020-09-28 ENCOUNTER — Ambulatory Visit
Admission: RE | Admit: 2020-09-28 | Discharge: 2020-09-28 | Disposition: A | Discharge status: Home / Self Care | Payer: Medicare Other | Source: Ambulatory Visit | Attending: Nurse Practitioner | Admitting: Nurse Practitioner

## 2020-09-28 ENCOUNTER — Other Ambulatory Visit: Payer: Self-pay | Admitting: Nurse Practitioner

## 2020-09-28 DIAGNOSIS — Z992 Dependence on renal dialysis: Secondary | ICD-10-CM | POA: Diagnosis not present

## 2020-09-28 DIAGNOSIS — R0689 Other abnormalities of breathing: Secondary | ICD-10-CM | POA: Diagnosis not present

## 2020-09-28 DIAGNOSIS — R059 Cough, unspecified: Secondary | ICD-10-CM

## 2020-09-28 DIAGNOSIS — N186 End stage renal disease: Secondary | ICD-10-CM | POA: Diagnosis not present

## 2020-09-28 DIAGNOSIS — I10 Essential (primary) hypertension: Secondary | ICD-10-CM

## 2020-09-28 DIAGNOSIS — R0989 Other specified symptoms and signs involving the circulatory and respiratory systems: Secondary | ICD-10-CM | POA: Diagnosis not present

## 2020-09-28 DIAGNOSIS — I129 Hypertensive chronic kidney disease with stage 1 through stage 4 chronic kidney disease, or unspecified chronic kidney disease: Secondary | ICD-10-CM | POA: Diagnosis not present

## 2020-09-28 IMAGING — CR DG CHEST 2V
2 series · 2 of 2 positions shown · non-contrast
Comparison: [DATE]

CLINICAL DATA: Decreased breath sounds bilaterally

EXAM:
CHEST - 2 VIEW

[w chest pa]
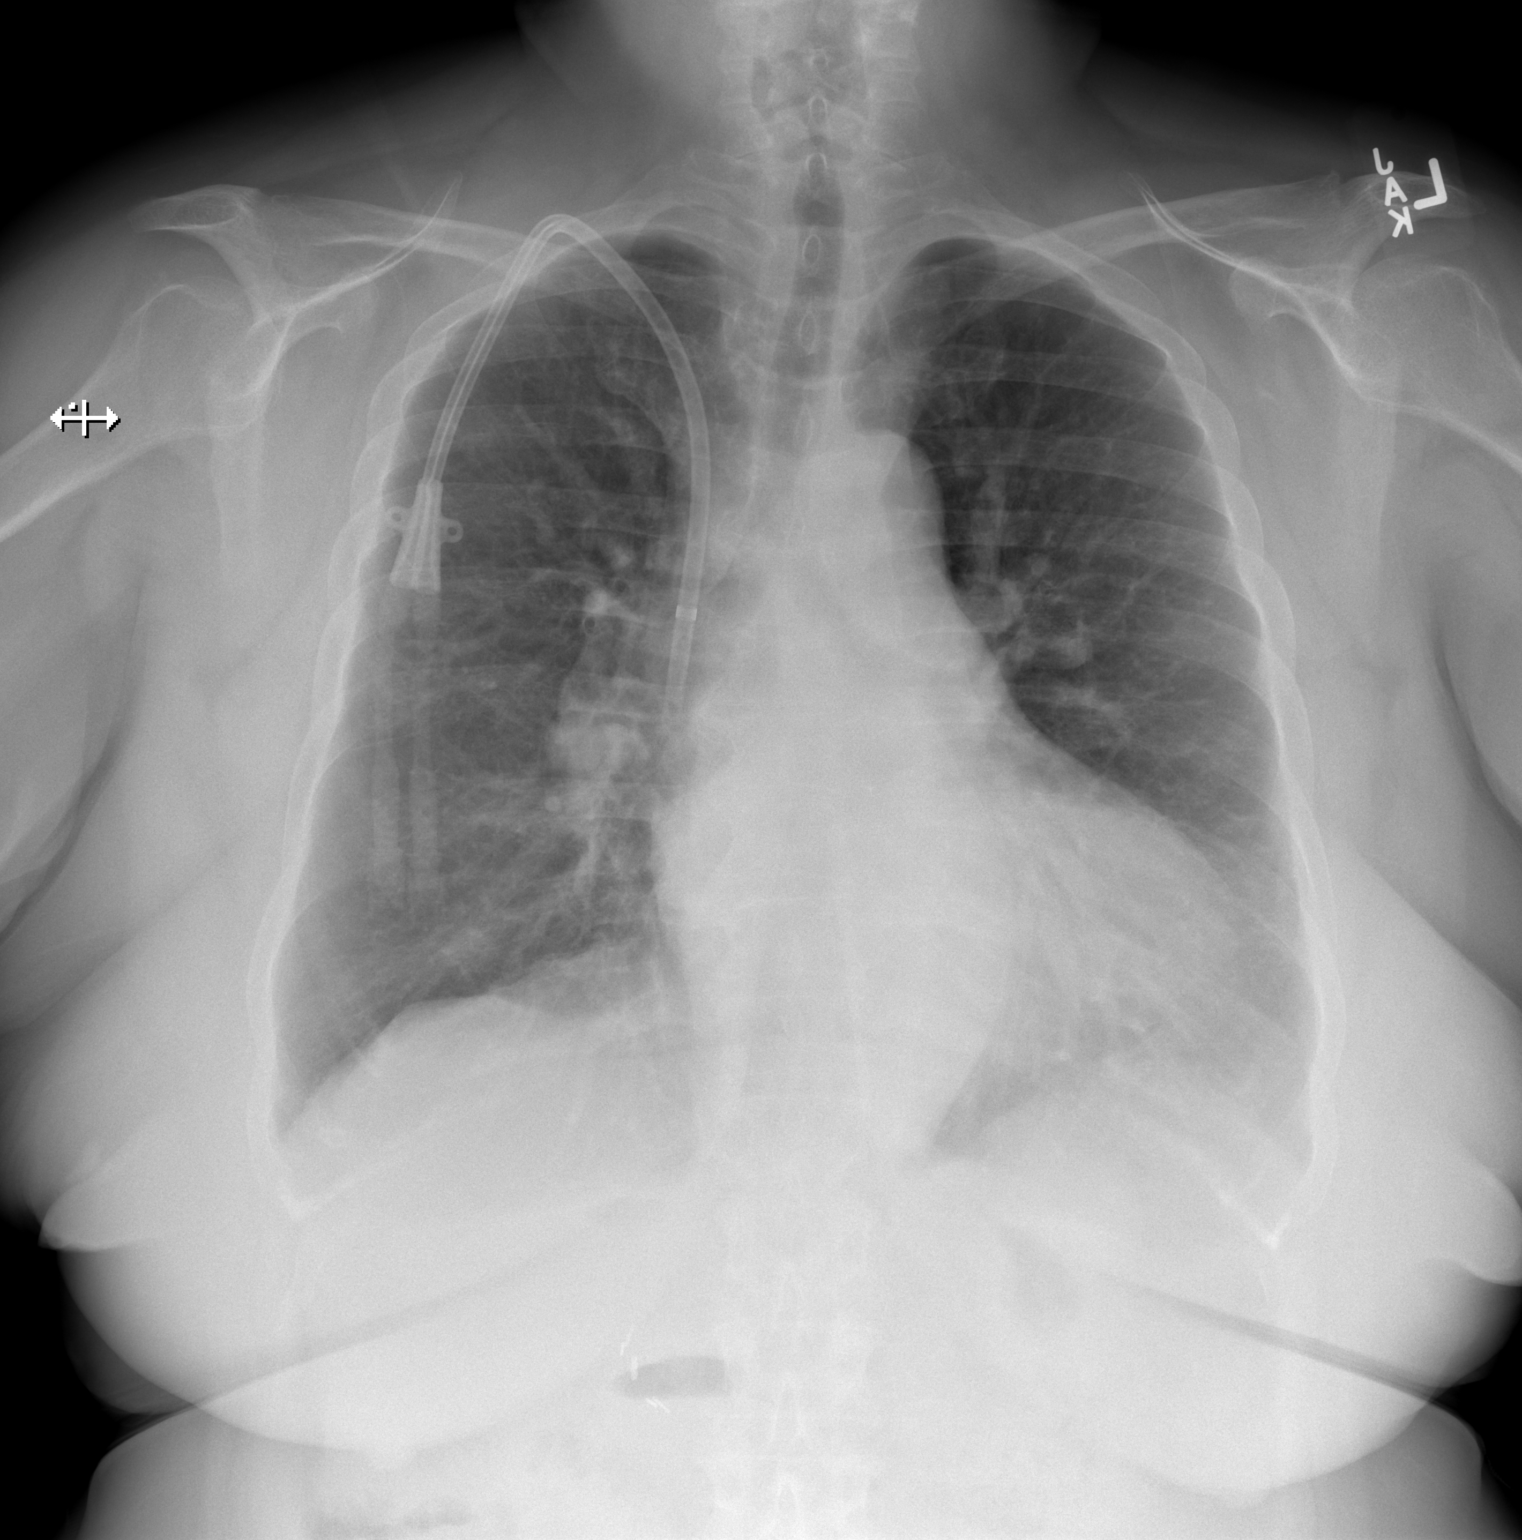

[w chest lat]
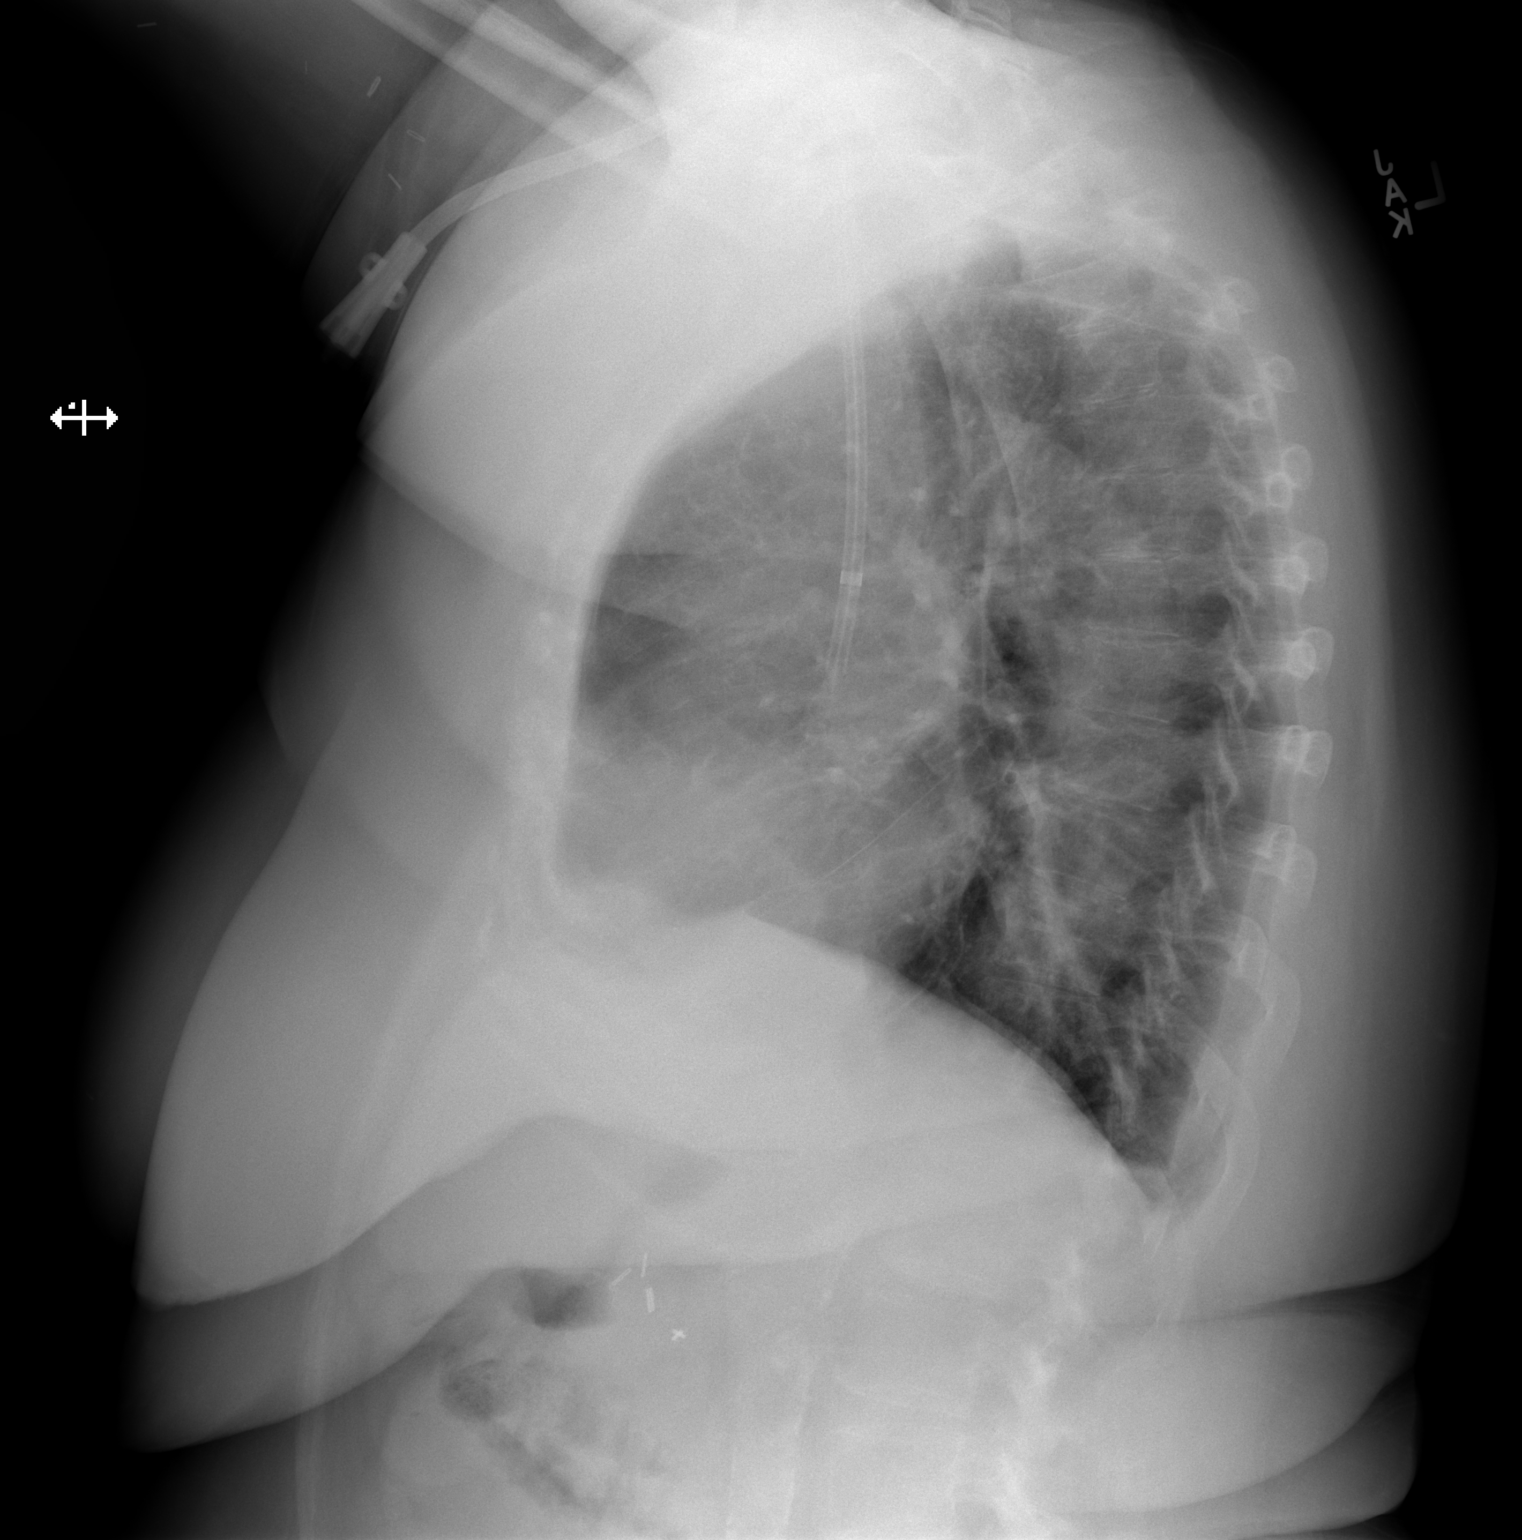

[2 of 2 positions shown; findings below may reference images not displayed]

FINDINGS: Right-sided dual lumen central venous catheter. No focal
consolidation. No pleural effusion or pneumothorax. Heart and
mediastinal contours are unremarkable.

No acute osseous abnormality.
IMPRESSION: No active cardiopulmonary disease.

## 2020-09-28 MED ORDER — AMLODIPINE BESYLATE 5 MG PO TABS: 5.0000 mg | ORAL_TABLET | Freq: Every day | ORAL | 1 refills | Status: DC

## 2020-09-29 DIAGNOSIS — N186 End stage renal disease: Secondary | ICD-10-CM | POA: Diagnosis not present

## 2020-09-29 DIAGNOSIS — N2581 Secondary hyperparathyroidism of renal origin: Secondary | ICD-10-CM | POA: Diagnosis not present

## 2020-09-29 DIAGNOSIS — D631 Anemia in chronic kidney disease: Secondary | ICD-10-CM | POA: Diagnosis not present

## 2020-09-29 DIAGNOSIS — A499 Bacterial infection, unspecified: Secondary | ICD-10-CM | POA: Diagnosis not present

## 2020-09-29 DIAGNOSIS — D689 Coagulation defect, unspecified: Secondary | ICD-10-CM | POA: Diagnosis not present

## 2020-09-29 DIAGNOSIS — Z992 Dependence on renal dialysis: Secondary | ICD-10-CM | POA: Diagnosis not present

## 2020-09-29 DIAGNOSIS — T80211A Bloodstream infection due to central venous catheter, initial encounter: Secondary | ICD-10-CM | POA: Diagnosis not present

## 2020-10-02 ENCOUNTER — Telehealth: Payer: Self-pay

## 2020-10-02 DIAGNOSIS — N2581 Secondary hyperparathyroidism of renal origin: Secondary | ICD-10-CM | POA: Diagnosis not present

## 2020-10-02 DIAGNOSIS — T80211A Bloodstream infection due to central venous catheter, initial encounter: Secondary | ICD-10-CM | POA: Diagnosis not present

## 2020-10-02 DIAGNOSIS — D631 Anemia in chronic kidney disease: Secondary | ICD-10-CM | POA: Diagnosis not present

## 2020-10-02 DIAGNOSIS — Z992 Dependence on renal dialysis: Secondary | ICD-10-CM | POA: Diagnosis not present

## 2020-10-02 DIAGNOSIS — A499 Bacterial infection, unspecified: Secondary | ICD-10-CM | POA: Diagnosis not present

## 2020-10-02 DIAGNOSIS — D689 Coagulation defect, unspecified: Secondary | ICD-10-CM | POA: Diagnosis not present

## 2020-10-02 DIAGNOSIS — N186 End stage renal disease: Secondary | ICD-10-CM | POA: Diagnosis not present

## 2020-10-02 NOTE — Chronic Care Management (AMB) (Signed)
Chronic Care Management Pharmacy Assistant   Name: Sedra Morfin  MRN: [id number] DOB: August 29, 1956  Reason for Encounter: Medication Review/ Monthly Dispensing Call  PCP : [name], FNP  Allergies:   Allergies  Allergen Reactions  . Carnosine     Other reaction(s): Unknown  . Gadolinium Derivatives Hives and Other (See Comments)    HIVES, Desc: HIVES W/ "DYE" USED FOR 1ST CT SCAN BUT NOT 2ND, NO PREMEDS USED, PT UNCERTAIN OF CIRCUMSTANCES,,?POSSIBLE MRI CONTRAST ALLERGY, ALL STUDIES DONE "SOMEWHERE" IN [place]//A.C., Onset Date: [id number]  . Iohexol Other (See Comments)     Code: HIVES, Desc: HIVES W/ "DYE" USED FOR 1ST CT SCAN BUT NOT 2ND, NO PREMEDS USED, PT UNCERTAIN OF CIRCUMSTANCES,,?POSSIBLE MRI CONTRAST ALLERGY, ALL STUDIES DONE "SOMEWHERE" IN [place]//A.C., Onset Date: [id number]   . Iodine Hives  . Naltrexone     Other reaction(s): Unknown    Medications: Outpatient Encounter Medications as of 10/02/2020  Medication Sig  . acetaminophen (TYLENOL) 500 MG tablet Take 1,000 mg by mouth 2 (two) times daily as needed for moderate pain or headache.  . albuterol (VENTOLIN HFA) 108 (90 Base) MCG/ACT inhaler Inhale 2 puffs into the lungs every 6 (six) hours as needed for wheezing or shortness of breath.  [name] amLODipine (NORVASC) 5 MG tablet Take 1 tablet (5 mg total) by mouth daily.  [name] amoxicillin (AMOXIL) 500 MG tablet Take 1 tablet (500 mg total) by mouth 2 (two) times daily.  [name] apixaban (ELIQUIS) 2.5 MG TABS tablet Take 1 tablet (2.5 mg total) by mouth 2 (two) times daily.  . Ascorbic Acid (VITAMIN C) 100 MG tablet Take 100 mg by mouth daily.  . Biotin 10000 MCG TABS Take 10,000 mcg by mouth daily.   . carvedilol (COREG) 12.5 MG tablet Take 12.5 mg by mouth See admin instructions. Takes Twice daily except Only takes once a day on dialysis days (holds in morning on Monday Wednesday and Friday)  . eszopiclone (LUNESTA) 2 MG TABS tablet Take 1 tablet (2 mg total)  by mouth at bedtime as needed for sleep. Take immediately before bedtime (Patient not taking: Reported on 09/26/2020)  . meclizine (ANTIVERT) 12.5 MG tablet Take 1 tablet (12.5 mg total) by mouth 3 (three) times daily as needed for dizziness.  . multivitamin (RENA-VIT) TABS tablet Take 1 tablet by mouth at bedtime.  . mupirocin ointment (BACTROBAN) 2 % Apply 1 application topically daily as needed (skin bumps).  . nystatin (NYSTATIN) powder Apply 1 application topically 4 (four) times daily as needed.  . ondansetron (ZOFRAN ODT) 4 MG disintegrating tablet Take 1 tablet (4 mg total) by mouth every 6 (six) hours as needed. (Patient not taking: Reported on 08/03/2020)  . oxyCODONE-acetaminophen (PERCOCET/ROXICET) 5-325 MG tablet Take 2 tablets by mouth every 4 (four) hours as needed for severe pain. (Patient taking differently: Take 1 tablet by mouth every 4 (four) hours as needed for severe pain. )  . sevelamer carbonate (RENVELA) 800 MG tablet Take 3,200 mg by mouth 3 (three) times daily with meals. Take 4 tablets with each meal  . temazepam (RESTORIL) 15 MG capsule Take 1 capsule (15 mg total) by mouth at bedtime as needed for sleep. (Patient not taking: Reported on 08/03/2020)  . vitamin E (VITAMIN E) 200 UNIT capsule Take 200 Units by mouth daily.   No facility-administered encounter medications on file as of 10/02/2020.    Current Diagnosis: Patient Active Problem List   Diagnosis Date Noted  . Hyperkalemia 05/09/2020  .  Acute respiratory failure with hypoxia ([place]) 05/09/2020  . Left leg pain 05/09/2020  . Restless leg syndrome 09/19/2018  . OSA (obstructive sleep apnea) 04/29/2018  . ESRD on dialysis ([place]) 04/29/2018  . Dyspnea on exertion 03/12/2017  . Morbid (severe) obesity due to excess calories ([place]) 03/12/2017  . Anemia of chronic kidney failure, stage 3 (moderate) ([place]) 12/10/2016  . History of colon cancer 08/12/2016  . Mass of left breast on mammogram 12/10/2015  . HTN  (hypertension) 10/18/2015  . Depression 10/18/2015  . Numbness and tingling of left arm and leg 01/24/2015  . PVD (peripheral vascular disease) with claudication ([place]) 03/23/2014  . PAD (peripheral artery disease) ([place]) 12/14/2013  . Chronic diastolic heart failure ([place]) 11/15/2013  . Essential hypertension 11/15/2013  . Leucocytosis 04/28/2012    Goals Addressed   None    Reviewed chart for medication changes ahead of medication coordination call.  OVs- 09/26/2020 [name], FNP(PCP) since last care coordination call/Pharmacist visit.  Medication changes indicated: Amoxicillin 500 mg started and Amlodipine 2.5 mg was increased to 5 mg once daily.   BP Readings from Last 3 Encounters:  09/26/20 120/82  08/03/20 (!) 190/84  08/03/20 (!) 190/84    Lab Results  Component Value Date   HGBA1C 5.1 06/04/2018     Patient obtains medications through Adherence Packaging and Vials  90 Days   Last adherence delivery included: Carvedilol 12.5 mg - 1 tablet by mouth twice a day(M/W/F dialysis days once daily(EM); T/Th/Sa/S Breakfast, EM), Eliquis 2.5 mg - 1 tablet by mouth twice a day (Breakfast, EM), Meclizine 12.5 mg -1 tablet by mouth three times a day as needed, Oxycodone/acetaminophen 5/325 mg - 2 tablets by mouth every four hours as needed for severe pain, Lunesta 2 mg- 1 tablet nightly (bedtime), Nystatin 100 gm- apply four times a day as needed, Albuterol 108 mcg/act inhaler - 2 puffs into the lungs every 6 hours as needed for wheezing or shortness of breath.    Patient declined Temazepam 15 mg due to not helping with Insomnia.  Patient is due for next adherence delivery on: 10/05/2020.  Called patient and reviewed medications, patient does not need any medications delivered at this time.  Patient declined the following medications: Amlodipine 5 mg- 1 tablet daily due to receiving a 90 day on 09/28/20 (new medication that was recently increased), Carvedilol 12.5 mg- 1 tablet by  mouth twice a day(M/W/F dialysis days once daily(EM); T/Th/Sa/S Breakfast, EM) due to receiving a 90 day supply on 08/04/2020 patient does not take twice a day on dialysis days has an adequate supply on hand, Eliquis 2.5 mg - 1 tablet by mouth twice a day (Breakfast, EM) due to receiving a 90 day supply on 08/04/2020 has an adequate supply on hand, Lunesta 2 mg- 1 tablet nightly (bedtime) due to not helping with Insomnia, Nystatin 100 gm- apply four times a day as needed, and Albuterol 108 mcg/act inhaler - 2 puffs into the lungs every 6 hours as needed for wheezing or shortness of breath.   Patient aware that we will contact her next month to follow up on medication needs and adherence.  Follow-Up:  Coordination of Enhanced Pharmacy Services and Pharmacist Review Patient does not need any refills at this time, has an adequate supply, aware we will follow up next month.  Patient mentioned while on the phone she did have another round of antibiotics during dialysis treatment 10/02/2020 due to having an infection in her catheter, wound culture came back  positive but blood cultures were negative. Patient was given IV Vancomycin. Patient also mentioned that her blood pressures are doing better and she is taking her medications as prescribed. Jannette Fogo, CPP notified.  [name], [place] Pharmacist Assistant [phone]

## 2020-10-04 ENCOUNTER — Telehealth: Payer: Self-pay

## 2020-10-04 DIAGNOSIS — A499 Bacterial infection, unspecified: Secondary | ICD-10-CM | POA: Diagnosis not present

## 2020-10-04 DIAGNOSIS — N186 End stage renal disease: Secondary | ICD-10-CM | POA: Diagnosis not present

## 2020-10-04 DIAGNOSIS — T80211A Bloodstream infection due to central venous catheter, initial encounter: Secondary | ICD-10-CM | POA: Diagnosis not present

## 2020-10-04 DIAGNOSIS — I509 Heart failure, unspecified: Secondary | ICD-10-CM | POA: Diagnosis not present

## 2020-10-04 DIAGNOSIS — N2581 Secondary hyperparathyroidism of renal origin: Secondary | ICD-10-CM | POA: Diagnosis not present

## 2020-10-04 DIAGNOSIS — D689 Coagulation defect, unspecified: Secondary | ICD-10-CM | POA: Diagnosis not present

## 2020-10-04 DIAGNOSIS — Z992 Dependence on renal dialysis: Secondary | ICD-10-CM | POA: Diagnosis not present

## 2020-10-04 DIAGNOSIS — I2609 Other pulmonary embolism with acute cor pulmonale: Secondary | ICD-10-CM | POA: Diagnosis not present

## 2020-10-04 DIAGNOSIS — G4733 Obstructive sleep apnea (adult) (pediatric): Secondary | ICD-10-CM | POA: Diagnosis not present

## 2020-10-04 DIAGNOSIS — D631 Anemia in chronic kidney disease: Secondary | ICD-10-CM | POA: Diagnosis not present

## 2020-10-04 NOTE — Telephone Encounter (Signed)
Called pt she told pharmacist that she was having SOB and chest pain. lvm for pt to return call

## 2020-10-05 ENCOUNTER — Telehealth: Payer: Medicare Other

## 2020-10-05 ENCOUNTER — Ambulatory Visit: Payer: Self-pay

## 2020-10-05 ENCOUNTER — Other Ambulatory Visit: Payer: Self-pay

## 2020-10-05 DIAGNOSIS — I10 Essential (primary) hypertension: Secondary | ICD-10-CM

## 2020-10-05 DIAGNOSIS — N186 End stage renal disease: Secondary | ICD-10-CM

## 2020-10-05 DIAGNOSIS — I5032 Chronic diastolic (congestive) heart failure: Secondary | ICD-10-CM

## 2020-10-06 DIAGNOSIS — T80211A Bloodstream infection due to central venous catheter, initial encounter: Secondary | ICD-10-CM | POA: Diagnosis not present

## 2020-10-06 DIAGNOSIS — N2581 Secondary hyperparathyroidism of renal origin: Secondary | ICD-10-CM | POA: Diagnosis not present

## 2020-10-06 DIAGNOSIS — Z992 Dependence on renal dialysis: Secondary | ICD-10-CM | POA: Diagnosis not present

## 2020-10-06 DIAGNOSIS — D631 Anemia in chronic kidney disease: Secondary | ICD-10-CM | POA: Diagnosis not present

## 2020-10-06 DIAGNOSIS — N186 End stage renal disease: Secondary | ICD-10-CM | POA: Diagnosis not present

## 2020-10-06 DIAGNOSIS — D689 Coagulation defect, unspecified: Secondary | ICD-10-CM | POA: Diagnosis not present

## 2020-10-06 DIAGNOSIS — A499 Bacterial infection, unspecified: Secondary | ICD-10-CM | POA: Diagnosis not present

## 2020-10-09 DIAGNOSIS — N2581 Secondary hyperparathyroidism of renal origin: Secondary | ICD-10-CM | POA: Diagnosis not present

## 2020-10-09 DIAGNOSIS — T80211A Bloodstream infection due to central venous catheter, initial encounter: Secondary | ICD-10-CM | POA: Diagnosis not present

## 2020-10-09 DIAGNOSIS — A499 Bacterial infection, unspecified: Secondary | ICD-10-CM | POA: Diagnosis not present

## 2020-10-09 DIAGNOSIS — Z992 Dependence on renal dialysis: Secondary | ICD-10-CM | POA: Diagnosis not present

## 2020-10-09 DIAGNOSIS — D631 Anemia in chronic kidney disease: Secondary | ICD-10-CM | POA: Diagnosis not present

## 2020-10-09 DIAGNOSIS — D689 Coagulation defect, unspecified: Secondary | ICD-10-CM | POA: Diagnosis not present

## 2020-10-09 DIAGNOSIS — N186 End stage renal disease: Secondary | ICD-10-CM | POA: Diagnosis not present

## 2020-10-10 NOTE — Patient Instructions (Addendum)
Visit Information  Goals Addressed      Patient Stated   .  COMPLETED: "My dialysis treatments are not going very good" (pt-stated)        Current Barriers:  [name] Knowledge Deficits related to ESRD . Chronic Disease Management support and education needs related to ESRD, HTN, CHF  . Dysfunctional dialysis access  Nurse Case Manager Clinical Goal(s):  [name] Over the next 90 days, patient will work with the [facility] to address needs related to ESRD for disease education related to hemodialysis treatment, fluid balance, dietary concerns and hemodialysis access  Goal Met  . 08/10/20 New Over the next 90 days, patient will continue to work with her Nephrology team and PCP for ongoing education and support for improved Self Health management of ESRD w/HD treatments as directed Goal Met   CCM RN CM Interventions:  10/05/20 call completed with patient . Evaluation of current treatment plan related to ESRD and patient's adherence to plan as established by provider . Determined patient continues to receive outpatient Hemodialysis at [facility] and is on a Monday, Wednesday, Friday schedule . Determined patient recently underwent an AV Fistulogram with good results per her surgeon and she is healing from this procedure without complications . Determined patient continues to receive her HD txts via a right external jugular vein tunneled hemodialysis catheter; Determined patient is currently being treated for suspected staph infection to her catheter after patient began to c/o pain and swelling at the catheter site . Discussed and reviewed patient's glomerular rate and discussed her monthly KT/V results which have shown to improve since utilizing her AVF and completing her full course of HD treatments as prescribed . Determined patient feels she is doing a good job following her renal diet and fluid recommendations which is also improving how her HD are going . Encouraged patient to  notify her Nephrologist and or HD staff right away of any concerns related to the patency of her AVF and encouraged daily inspections for bruit/thrill   . Discussed plans with patient for ongoing care management follow up and provided patient with direct contact information for care management team  Patient Self Care Activities:  . Self administers medications as prescribed . Attends all scheduled provider appointments . Calls pharmacy for medication refills . Attends church or other social activities . Performs ADL's independently . Performs IADL's independently . Calls provider office for new concerns or questions  Please see past updates related to this goal by clicking on the "Past Updates" button in the selected goal      .  "to have my vitamin d checked" (pt-stated)        [place] (see longitudinal plan of care for additional care plan information)  Current Barriers:  [name] Knowledge Deficits related to evaluation and treatment of Vitamin d deficiency . Chronic Disease Management support and education needs related to ESRD on HD, CHF, HTN  Nurse Case Manager Clinical Goal(s):  [name] Over the next 90 days, patient will work with the CCM team and PCP to address needs related to evaluation and treatment of Vitamin d deficiency   CCM RN CM Interventions:  10/05/20 call completed with patient  . Inter-disciplinary care team collaboration (see longitudinal plan of care) . Evaluation of current treatment plan related to Vitamin d deficiency and patient's adherence to plan as established by provider . Determined patient is experiencing symptoms suggestive of Vitamin d deficiency and would like to have this level checked  at her next PCP OV . Provided education to patient re: role of Vitamin d, Educated on dietary and other treatments recommendations if deficient; Educated on target range for optimal health; Educated on potential s/s if left untreated . Reviewed medications with patient  and discussed patient is not currently taking a Vitamin D supplement  . Discussed patient's next scheduled appointment with PCP is scheduled for 11/09/20 @2 :16 PM  . Discussed plans with patient for ongoing care management follow up and provided patient with direct contact information for care management team . Provided patient with printed educational materials related to FAQ about Vitamin D; What is Vitamin D  Patient Self Care Activities:  . Self administers medications as prescribed . Attends all scheduled provider appointments . Calls pharmacy for medication refills . Performs ADL's independently . Performs IADL's independently . Calls provider office for new concerns or questions  Initial goal documentation        Patient verbalizes understanding of instructions provided today.   Telephone follow up appointment with care management team member scheduled for: 11/13/20  [name], RN, BSN, CCM Care Management Coordinator [facility]  Direct Phone: [phone]

## 2020-10-10 NOTE — Chronic Care Management (AMB) (Signed)
Chronic Care Management   Follow Up Note   10/05/2020 Name: Whitney Walker MRN: [id number] DOB: [birth year]  Referred by: [name], FNP Reason for referral : Chronic Care Management (CCM RN CM FU Call )   Whitney Walker is a 64 y.o. year old female who is a primary care patient of [name], [place]. The CCM team was consulted for assistance with chronic disease management and care coordination needs.    Review of patient status, including review of consultants reports, relevant laboratory and other test results, and collaboration with appropriate care team members and the patient's provider was performed as part of comprehensive patient evaluation and provision of chronic care management services.    SDOH (Social Determinants of Health) assessments performed: Yes - no acute challenges identified at this time  See Care Plan activities for detailed interventions related to [place])   Placed outbound CCM RN CM follow up call to patient for a care plan update.     Outpatient Encounter Medications as of 10/05/2020  Medication Sig  . acetaminophen (TYLENOL) 500 MG tablet Take 1,000 mg by mouth 2 (two) times daily as needed for moderate pain or headache.  . albuterol (VENTOLIN HFA) 108 (90 Base) MCG/ACT inhaler Inhale 2 puffs into the lungs every 6 (six) hours as needed for wheezing or shortness of breath.  [name] amLODipine (NORVASC) 5 MG tablet Take 1 tablet (5 mg total) by mouth daily.  [name] amoxicillin (AMOXIL) 500 MG tablet Take 1 tablet (500 mg total) by mouth 2 (two) times daily.  [name] apixaban (ELIQUIS) 2.5 MG TABS tablet Take 1 tablet (2.5 mg total) by mouth 2 (two) times daily.  . Ascorbic Acid (VITAMIN C) 100 MG tablet Take 100 mg by mouth daily.  . Biotin 10000 MCG TABS Take 10,000 mcg by mouth daily.   . carvedilol (COREG) 12.5 MG tablet Take 12.5 mg by mouth See admin instructions. Takes Twice daily except Only takes once a day on dialysis days (holds in morning on Monday  Wednesday and Friday)  . eszopiclone (LUNESTA) 2 MG TABS tablet Take 1 tablet (2 mg total) by mouth at bedtime as needed for sleep. Take immediately before bedtime (Patient not taking: Reported on 09/26/2020)  . meclizine (ANTIVERT) 12.5 MG tablet Take 1 tablet (12.5 mg total) by mouth 3 (three) times daily as needed for dizziness.  . multivitamin (RENA-VIT) TABS tablet Take 1 tablet by mouth at bedtime.  . mupirocin ointment (BACTROBAN) 2 % Apply 1 application topically daily as needed (skin bumps).  . nystatin (NYSTATIN) powder Apply 1 application topically 4 (four) times daily as needed.  . ondansetron (ZOFRAN ODT) 4 MG disintegrating tablet Take 1 tablet (4 mg total) by mouth every 6 (six) hours as needed. (Patient not taking: Reported on 08/03/2020)  . oxyCODONE-acetaminophen (PERCOCET/ROXICET) 5-325 MG tablet Take 2 tablets by mouth every 4 (four) hours as needed for severe pain. (Patient taking differently: Take 1 tablet by mouth every 4 (four) hours as needed for severe pain. )  . sevelamer carbonate (RENVELA) 800 MG tablet Take 3,200 mg by mouth 3 (three) times daily with meals. Take 4 tablets with each meal  . temazepam (RESTORIL) 15 MG capsule Take 1 capsule (15 mg total) by mouth at bedtime as needed for sleep. (Patient not taking: Reported on 08/03/2020)  . vitamin E (VITAMIN E) 200 UNIT capsule Take 200 Units by mouth daily.   No facility-administered encounter medications on file as of 10/05/2020.     Objective:  Lab Results  Component Value Date   HGBA1C 5.1 06/04/2018   HGBA1C (H) 09/07/2009    6.5 (NOTE) The ADA recommends the following therapeutic goal for glycemic control related to Hgb A1c measurement: Goal of therapy: <6.5 Hgb A1c  Reference: American Diabetes Association: Clinical Practice Recommendations 2010, Diabetes Care, 2010, 33: (Suppl  1).   Lab Results  Component Value Date   LDLCALC 97 06/04/2018   CREATININE 6.98 (HH) 05/18/2020   BP Readings from Last 3  Encounters:  09/26/20 120/82  08/03/20 (!) 190/84  08/03/20 (!) 190/84    Goals Addressed      Patient Stated   .  COMPLETED: "My dialysis treatments are not going very good" (pt-stated)        Current Barriers:  [name] Knowledge Deficits related to ESRD . Chronic Disease Management support and education needs related to ESRD, HTN, CHF  . Dysfunctional dialysis access  Nurse Case Manager Clinical Goal(s):  [name] Over the next 90 days, patient will work with the [facility] to address needs related to ESRD for disease education related to hemodialysis treatment, fluid balance, dietary concerns and hemodialysis access  Goal Met  . 08/10/20 New Over the next 90 days, patient will continue to work with her Nephrology team and PCP for ongoing education and support for improved Self Health management of ESRD w/HD treatments as directed Goal Met   CCM RN CM Interventions:  10/05/20 call completed with patient . Evaluation of current treatment plan related to ESRD and patient's adherence to plan as established by provider . Determined patient continues to receive outpatient Hemodialysis at [facility] and is on a Monday, Wednesday, Friday schedule . Determined patient recently underwent an AV Fistulogram with good results per her surgeon and she is healing from this procedure without complications . Determined patient continues to receive her HD txts via a right external jugular vein tunneled hemodialysis catheter; Determined patient is currently being treated for suspected staph infection to her catheter after patient began to c/o pain and swelling at the catheter site . Discussed and reviewed patient's glomerular rate and discussed her monthly KT/V results which have shown to improve since utilizing her AVF and completing her full course of HD treatments as prescribed . Determined patient feels she is doing a good job following her renal diet and fluid recommendations which is  also improving how her HD are going . Encouraged patient to notify her Nephrologist and or HD staff right away of any concerns related to the patency of her AVF and encouraged daily inspections for bruit/thrill   . Discussed plans with patient for ongoing care management follow up and provided patient with direct contact information for care management team  Patient Self Care Activities:  . Self administers medications as prescribed . Attends all scheduled provider appointments . Calls pharmacy for medication refills . Attends church or other social activities . Performs ADL's independently . Performs IADL's independently . Calls provider office for new concerns or questions  Please see past updates related to this goal by clicking on the "Past Updates" button in the selected goal      .  "to have my vitamin d checked" (pt-stated)        [place] (see longitudinal plan of care for additional care plan information)  Current Barriers:  [name] Knowledge Deficits related to evaluation and treatment of Vitamin d deficiency . Chronic Disease Management support and education needs related to ESRD on HD, CHF,  HTN  Nurse Case Manager Clinical Goal(s):  [name] Over the next 90 days, patient will work with the CCM team and PCP to address needs related to evaluation and treatment of Vitamin d deficiency   CCM RN CM Interventions:  10/05/20 call completed with patient  . Inter-disciplinary care team collaboration (see longitudinal plan of care) . Evaluation of current treatment plan related to Vitamin d deficiency and patient's adherence to plan as established by provider . Determined patient is experiencing symptoms suggestive of Vitamin d deficiency and would like to have this level checked at her next PCP OV . Provided education to patient re: role of Vitamin d, Educated on dietary and other treatments recommendations if deficient; Educated on target range for optimal health; Educated on potential  s/s if left untreated . Reviewed medications with patient and discussed patient is not currently taking a Vitamin D supplement  . Discussed patient's next scheduled appointment with PCP is scheduled for 11/09/20 @2 :50 PM  . Discussed plans with patient for ongoing care management follow up and provided patient with direct contact information for care management team . Provided patient with printed educational materials related to FAQ about Vitamin D; What is Vitamin D  Patient Self Care Activities:  . Self administers medications as prescribed . Attends all scheduled provider appointments . Calls pharmacy for medication refills . Performs ADL's independently . Performs IADL's independently . Calls provider office for new concerns or questions  Initial goal documentation        Plan:   Telephone follow up appointment with care management team member scheduled for: 11/13/20  [name], RN, BSN, CCM Care Management Coordinator [place] [facility]  Direct Phone: [phone]

## 2020-10-11 DIAGNOSIS — N186 End stage renal disease: Secondary | ICD-10-CM | POA: Diagnosis not present

## 2020-10-11 DIAGNOSIS — A499 Bacterial infection, unspecified: Secondary | ICD-10-CM | POA: Diagnosis not present

## 2020-10-11 DIAGNOSIS — Z992 Dependence on renal dialysis: Secondary | ICD-10-CM | POA: Diagnosis not present

## 2020-10-11 DIAGNOSIS — T80211A Bloodstream infection due to central venous catheter, initial encounter: Secondary | ICD-10-CM | POA: Diagnosis not present

## 2020-10-11 DIAGNOSIS — D631 Anemia in chronic kidney disease: Secondary | ICD-10-CM | POA: Diagnosis not present

## 2020-10-11 DIAGNOSIS — N2581 Secondary hyperparathyroidism of renal origin: Secondary | ICD-10-CM | POA: Diagnosis not present

## 2020-10-11 DIAGNOSIS — D689 Coagulation defect, unspecified: Secondary | ICD-10-CM | POA: Diagnosis not present

## 2020-10-13 DIAGNOSIS — Z992 Dependence on renal dialysis: Secondary | ICD-10-CM | POA: Diagnosis not present

## 2020-10-13 DIAGNOSIS — D631 Anemia in chronic kidney disease: Secondary | ICD-10-CM | POA: Diagnosis not present

## 2020-10-13 DIAGNOSIS — D689 Coagulation defect, unspecified: Secondary | ICD-10-CM | POA: Diagnosis not present

## 2020-10-13 DIAGNOSIS — N2581 Secondary hyperparathyroidism of renal origin: Secondary | ICD-10-CM | POA: Diagnosis not present

## 2020-10-13 DIAGNOSIS — N186 End stage renal disease: Secondary | ICD-10-CM | POA: Diagnosis not present

## 2020-10-13 DIAGNOSIS — A499 Bacterial infection, unspecified: Secondary | ICD-10-CM | POA: Diagnosis not present

## 2020-10-13 DIAGNOSIS — T80211A Bloodstream infection due to central venous catheter, initial encounter: Secondary | ICD-10-CM | POA: Diagnosis not present

## 2020-10-15 NOTE — Chronic Care Management (AMB) (Signed)
Chronic Care Management Pharmacy Assistant   Name: Whitney Walker  MRN: [id number] DOB: 07/02/1956  Reason for Encounter: Emergency Call  10/04/2020- Patient called in the morning with complaints of having chest pains, patient states her pain started on Sunday, she has tried Tylenol with no relief. Patient had some oxycodone on hand that worked but patient returns. Patient stated it hurts when she breaks and lays on her left side. Patient states it feels like a pulled muscle and as of today the pain has subsided. Asked patient if medications were taken today and she verified that they were and that her blood pressures are good today, patient did not have any readings to give at this time. Patient was currently at the dialysis center when she called and confirmed she notified the staff. Inquired if a EKG was done and patient informed that one was not done. Advised patient that I will contact Jannette Fogo, CPP ASAP so she can notify her PCP and they will give her a call. As I was on the phone with the patient, I was able to message the pharmacist who spoke with Darleen Crocker, FNP and they had her CMA give patient a call. Patient went to answer the call and the phone got disconnected.  Patient called back, missed call. Called patient back and she was not able to speak with the nurse but she felt like she was going to be ok. I advised patient that CMA will call back to gather more information and give advisements for her chest pains.   10/10/2020- Patient called inquiring about what ondansetron 4 mg tablets were for. Explained to patient that this medication is to help with nausea. Patient was thankful for the explanation, she was also stated that she was feeling better after last week with her chest pain complaints. Jannette Fogo, CPP notified.    PCP : [name], FNP  Allergies:   Allergies  Allergen Reactions  . Carnosine     Other reaction(s): Unknown  . Gadolinium Derivatives  Hives and Other (See Comments)    HIVES, Desc: HIVES W/ "DYE" USED FOR 1ST CT SCAN BUT NOT 2ND, NO PREMEDS USED, PT UNCERTAIN OF CIRCUMSTANCES,,?POSSIBLE MRI CONTRAST ALLERGY, ALL STUDIES DONE "SOMEWHERE" IN [place]//A.C., Onset Date: [id number]  . Iohexol Other (See Comments)     Code: HIVES, Desc: HIVES W/ "DYE" USED FOR 1ST CT SCAN BUT NOT 2ND, NO PREMEDS USED, PT UNCERTAIN OF CIRCUMSTANCES,,?POSSIBLE MRI CONTRAST ALLERGY, ALL STUDIES DONE "SOMEWHERE" IN [place]//A.C., Onset Date: [id number]   . Iodine Hives  . Naltrexone     Other reaction(s): Unknown    Medications: Outpatient Encounter Medications as of 10/04/2020  Medication Sig  . acetaminophen (TYLENOL) 500 MG tablet Take 1,000 mg by mouth 2 (two) times daily as needed for moderate pain or headache.  . albuterol (VENTOLIN HFA) 108 (90 Base) MCG/ACT inhaler Inhale 2 puffs into the lungs every 6 (six) hours as needed for wheezing or shortness of breath.  [name] amLODipine (NORVASC) 5 MG tablet Take 1 tablet (5 mg total) by mouth daily.  [name] amoxicillin (AMOXIL) 500 MG tablet Take 1 tablet (500 mg total) by mouth 2 (two) times daily.  [name] apixaban (ELIQUIS) 2.5 MG TABS tablet Take 1 tablet (2.5 mg total) by mouth 2 (two) times daily.  . Ascorbic Acid (VITAMIN C) 100 MG tablet Take 100 mg by mouth daily.  . Biotin 10000 MCG TABS Take 10,000 mcg by mouth daily.   . carvedilol (COREG) 12.5 MG  tablet Take 12.5 mg by mouth See admin instructions. Takes Twice daily except Only takes once a day on dialysis days (holds in morning on Monday Wednesday and Friday)  . eszopiclone (LUNESTA) 2 MG TABS tablet Take 1 tablet (2 mg total) by mouth at bedtime as needed for sleep. Take immediately before bedtime (Patient not taking: Reported on 09/26/2020)  . meclizine (ANTIVERT) 12.5 MG tablet Take 1 tablet (12.5 mg total) by mouth 3 (three) times daily as needed for dizziness.  . multivitamin (RENA-VIT) TABS tablet Take 1 tablet by mouth at bedtime.  .  mupirocin ointment (BACTROBAN) 2 % Apply 1 application topically daily as needed (skin bumps).  . nystatin (NYSTATIN) powder Apply 1 application topically 4 (four) times daily as needed.  . ondansetron (ZOFRAN ODT) 4 MG disintegrating tablet Take 1 tablet (4 mg total) by mouth every 6 (six) hours as needed. (Patient not taking: Reported on 08/03/2020)  . oxyCODONE-acetaminophen (PERCOCET/ROXICET) 5-325 MG tablet Take 2 tablets by mouth every 4 (four) hours as needed for severe pain. (Patient taking differently: Take 1 tablet by mouth every 4 (four) hours as needed for severe pain. )  . sevelamer carbonate (RENVELA) 800 MG tablet Take 3,200 mg by mouth 3 (three) times daily with meals. Take 4 tablets with each meal  . temazepam (RESTORIL) 15 MG capsule Take 1 capsule (15 mg total) by mouth at bedtime as needed for sleep. (Patient not taking: Reported on 08/03/2020)  . vitamin E (VITAMIN E) 200 UNIT capsule Take 200 Units by mouth daily.   No facility-administered encounter medications on file as of 10/04/2020.    Current Diagnosis: Patient Active Problem List   Diagnosis Date Noted  . Hyperkalemia 05/09/2020  . Acute respiratory failure with hypoxia ([place]) 05/09/2020  . Left leg pain 05/09/2020  . Restless leg syndrome 09/19/2018  . OSA (obstructive sleep apnea) 04/29/2018  . ESRD on dialysis ([place]) 04/29/2018  . Dyspnea on exertion 03/12/2017  . Morbid (severe) obesity due to excess calories ([place]) 03/12/2017  . Anemia of chronic kidney failure, stage 3 (moderate) ([place]) 12/10/2016  . History of colon cancer 08/12/2016  . Mass of left breast on mammogram 12/10/2015  . HTN (hypertension) 10/18/2015  . Depression 10/18/2015  . Numbness and tingling of left arm and leg 01/24/2015  . PVD (peripheral vascular disease) with claudication ([place]) 03/23/2014  . PAD (peripheral artery disease) ([place]) 12/14/2013  . Chronic diastolic heart failure ([place]) 11/15/2013  . Essential hypertension 11/15/2013  .  Leucocytosis 04/28/2012     Follow-Up:  Pharmacist Review- Jannette Fogo, CPP aware of conversation and she will follow up with patient.  [name], [place] Pharmacist Assistant [phone]

## 2020-10-16 DIAGNOSIS — Z992 Dependence on renal dialysis: Secondary | ICD-10-CM | POA: Diagnosis not present

## 2020-10-16 DIAGNOSIS — N2581 Secondary hyperparathyroidism of renal origin: Secondary | ICD-10-CM | POA: Diagnosis not present

## 2020-10-16 DIAGNOSIS — T80211A Bloodstream infection due to central venous catheter, initial encounter: Secondary | ICD-10-CM | POA: Diagnosis not present

## 2020-10-16 DIAGNOSIS — N186 End stage renal disease: Secondary | ICD-10-CM | POA: Diagnosis not present

## 2020-10-16 DIAGNOSIS — A499 Bacterial infection, unspecified: Secondary | ICD-10-CM | POA: Diagnosis not present

## 2020-10-16 DIAGNOSIS — D631 Anemia in chronic kidney disease: Secondary | ICD-10-CM | POA: Diagnosis not present

## 2020-10-16 DIAGNOSIS — D689 Coagulation defect, unspecified: Secondary | ICD-10-CM | POA: Diagnosis not present

## 2020-10-18 DIAGNOSIS — Z992 Dependence on renal dialysis: Secondary | ICD-10-CM | POA: Diagnosis not present

## 2020-10-18 DIAGNOSIS — N2581 Secondary hyperparathyroidism of renal origin: Secondary | ICD-10-CM | POA: Diagnosis not present

## 2020-10-18 DIAGNOSIS — A499 Bacterial infection, unspecified: Secondary | ICD-10-CM | POA: Diagnosis not present

## 2020-10-18 DIAGNOSIS — D689 Coagulation defect, unspecified: Secondary | ICD-10-CM | POA: Diagnosis not present

## 2020-10-18 DIAGNOSIS — T80211A Bloodstream infection due to central venous catheter, initial encounter: Secondary | ICD-10-CM | POA: Diagnosis not present

## 2020-10-18 DIAGNOSIS — D631 Anemia in chronic kidney disease: Secondary | ICD-10-CM | POA: Diagnosis not present

## 2020-10-18 DIAGNOSIS — N186 End stage renal disease: Secondary | ICD-10-CM | POA: Diagnosis not present

## 2020-10-18 LAB — BASIC METABOLIC PANEL: Potassium: 6.5 — AB (ref 3.4–5.3)

## 2020-10-20 ENCOUNTER — Telehealth: Payer: Self-pay

## 2020-10-20 DIAGNOSIS — T80211A Bloodstream infection due to central venous catheter, initial encounter: Secondary | ICD-10-CM | POA: Diagnosis not present

## 2020-10-20 DIAGNOSIS — D631 Anemia in chronic kidney disease: Secondary | ICD-10-CM | POA: Diagnosis not present

## 2020-10-20 DIAGNOSIS — A499 Bacterial infection, unspecified: Secondary | ICD-10-CM | POA: Diagnosis not present

## 2020-10-20 DIAGNOSIS — N186 End stage renal disease: Secondary | ICD-10-CM | POA: Diagnosis not present

## 2020-10-20 DIAGNOSIS — Z992 Dependence on renal dialysis: Secondary | ICD-10-CM | POA: Diagnosis not present

## 2020-10-20 DIAGNOSIS — D689 Coagulation defect, unspecified: Secondary | ICD-10-CM | POA: Diagnosis not present

## 2020-10-20 DIAGNOSIS — N2581 Secondary hyperparathyroidism of renal origin: Secondary | ICD-10-CM | POA: Diagnosis not present

## 2020-10-23 DIAGNOSIS — A499 Bacterial infection, unspecified: Secondary | ICD-10-CM | POA: Diagnosis not present

## 2020-10-23 DIAGNOSIS — N186 End stage renal disease: Secondary | ICD-10-CM | POA: Diagnosis not present

## 2020-10-23 DIAGNOSIS — T80211A Bloodstream infection due to central venous catheter, initial encounter: Secondary | ICD-10-CM | POA: Diagnosis not present

## 2020-10-23 DIAGNOSIS — D631 Anemia in chronic kidney disease: Secondary | ICD-10-CM | POA: Diagnosis not present

## 2020-10-23 DIAGNOSIS — Z992 Dependence on renal dialysis: Secondary | ICD-10-CM | POA: Diagnosis not present

## 2020-10-23 DIAGNOSIS — D689 Coagulation defect, unspecified: Secondary | ICD-10-CM | POA: Diagnosis not present

## 2020-10-23 DIAGNOSIS — N2581 Secondary hyperparathyroidism of renal origin: Secondary | ICD-10-CM | POA: Diagnosis not present

## 2020-10-25 DIAGNOSIS — D631 Anemia in chronic kidney disease: Secondary | ICD-10-CM | POA: Diagnosis not present

## 2020-10-25 DIAGNOSIS — N2581 Secondary hyperparathyroidism of renal origin: Secondary | ICD-10-CM | POA: Diagnosis not present

## 2020-10-25 DIAGNOSIS — N186 End stage renal disease: Secondary | ICD-10-CM | POA: Diagnosis not present

## 2020-10-25 DIAGNOSIS — A499 Bacterial infection, unspecified: Secondary | ICD-10-CM | POA: Diagnosis not present

## 2020-10-25 DIAGNOSIS — Z992 Dependence on renal dialysis: Secondary | ICD-10-CM | POA: Diagnosis not present

## 2020-10-25 DIAGNOSIS — T80211A Bloodstream infection due to central venous catheter, initial encounter: Secondary | ICD-10-CM | POA: Diagnosis not present

## 2020-10-25 DIAGNOSIS — D689 Coagulation defect, unspecified: Secondary | ICD-10-CM | POA: Diagnosis not present

## 2020-10-27 DIAGNOSIS — Z992 Dependence on renal dialysis: Secondary | ICD-10-CM | POA: Diagnosis not present

## 2020-10-27 DIAGNOSIS — N186 End stage renal disease: Secondary | ICD-10-CM | POA: Diagnosis not present

## 2020-10-27 DIAGNOSIS — A499 Bacterial infection, unspecified: Secondary | ICD-10-CM | POA: Diagnosis not present

## 2020-10-27 DIAGNOSIS — D631 Anemia in chronic kidney disease: Secondary | ICD-10-CM | POA: Diagnosis not present

## 2020-10-27 DIAGNOSIS — T80211A Bloodstream infection due to central venous catheter, initial encounter: Secondary | ICD-10-CM | POA: Diagnosis not present

## 2020-10-27 DIAGNOSIS — N2581 Secondary hyperparathyroidism of renal origin: Secondary | ICD-10-CM | POA: Diagnosis not present

## 2020-10-27 DIAGNOSIS — D689 Coagulation defect, unspecified: Secondary | ICD-10-CM | POA: Diagnosis not present

## 2020-10-29 DIAGNOSIS — I129 Hypertensive chronic kidney disease with stage 1 through stage 4 chronic kidney disease, or unspecified chronic kidney disease: Secondary | ICD-10-CM | POA: Diagnosis not present

## 2020-10-29 DIAGNOSIS — Z992 Dependence on renal dialysis: Secondary | ICD-10-CM | POA: Diagnosis not present

## 2020-10-29 DIAGNOSIS — N186 End stage renal disease: Secondary | ICD-10-CM | POA: Diagnosis not present

## 2020-10-30 ENCOUNTER — Emergency Department (HOSPITAL_COMMUNITY): Payer: Medicare Other

## 2020-10-30 ENCOUNTER — Ambulatory Visit: Payer: Medicare Other

## 2020-10-30 ENCOUNTER — Other Ambulatory Visit: Payer: Self-pay

## 2020-10-30 ENCOUNTER — Encounter (HOSPITAL_COMMUNITY): Payer: Self-pay | Admitting: *Deleted

## 2020-10-30 ENCOUNTER — Inpatient Hospital Stay (HOSPITAL_COMMUNITY)
Admission: EM | Admit: 2020-10-30 | Discharge: 2020-11-01 | DRG: 640 | Disposition: A | Discharge status: Home / Self Care | Payer: Medicare Other | Attending: Internal Medicine | Admitting: Internal Medicine

## 2020-10-30 DIAGNOSIS — G2581 Restless legs syndrome: Secondary | ICD-10-CM | POA: Diagnosis not present

## 2020-10-30 DIAGNOSIS — I509 Heart failure, unspecified: Secondary | ICD-10-CM | POA: Diagnosis not present

## 2020-10-30 DIAGNOSIS — G4733 Obstructive sleep apnea (adult) (pediatric): Secondary | ICD-10-CM | POA: Diagnosis present

## 2020-10-30 DIAGNOSIS — F419 Anxiety disorder, unspecified: Secondary | ICD-10-CM | POA: Diagnosis present

## 2020-10-30 DIAGNOSIS — J9601 Acute respiratory failure with hypoxia: Secondary | ICD-10-CM | POA: Diagnosis not present

## 2020-10-30 DIAGNOSIS — Z9049 Acquired absence of other specified parts of digestive tract: Secondary | ICD-10-CM

## 2020-10-30 DIAGNOSIS — Z888 Allergy status to other drugs, medicaments and biological substances status: Secondary | ICD-10-CM

## 2020-10-30 DIAGNOSIS — Z7901 Long term (current) use of anticoagulants: Secondary | ICD-10-CM

## 2020-10-30 DIAGNOSIS — W19XXXA Unspecified fall, initial encounter: Secondary | ICD-10-CM | POA: Diagnosis present

## 2020-10-30 DIAGNOSIS — Z91041 Radiographic dye allergy status: Secondary | ICD-10-CM

## 2020-10-30 DIAGNOSIS — Z743 Need for continuous supervision: Secondary | ICD-10-CM | POA: Diagnosis not present

## 2020-10-30 DIAGNOSIS — E8770 Fluid overload, unspecified: Secondary | ICD-10-CM | POA: Diagnosis not present

## 2020-10-30 DIAGNOSIS — I132 Hypertensive heart and chronic kidney disease with heart failure and with stage 5 chronic kidney disease, or end stage renal disease: Secondary | ICD-10-CM | POA: Diagnosis not present

## 2020-10-30 DIAGNOSIS — Z79899 Other long term (current) drug therapy: Secondary | ICD-10-CM

## 2020-10-30 DIAGNOSIS — N2581 Secondary hyperparathyroidism of renal origin: Secondary | ICD-10-CM | POA: Diagnosis present

## 2020-10-30 DIAGNOSIS — R001 Bradycardia, unspecified: Secondary | ICD-10-CM | POA: Diagnosis not present

## 2020-10-30 DIAGNOSIS — Z6836 Body mass index (BMI) 36.0-36.9, adult: Secondary | ICD-10-CM

## 2020-10-30 DIAGNOSIS — Z992 Dependence on renal dialysis: Secondary | ICD-10-CM

## 2020-10-30 DIAGNOSIS — I739 Peripheral vascular disease, unspecified: Secondary | ICD-10-CM | POA: Diagnosis not present

## 2020-10-30 DIAGNOSIS — I10 Essential (primary) hypertension: Secondary | ICD-10-CM

## 2020-10-30 DIAGNOSIS — R2689 Other abnormalities of gait and mobility: Secondary | ICD-10-CM | POA: Diagnosis not present

## 2020-10-30 DIAGNOSIS — E875 Hyperkalemia: Secondary | ICD-10-CM | POA: Diagnosis not present

## 2020-10-30 DIAGNOSIS — I5032 Chronic diastolic (congestive) heart failure: Secondary | ICD-10-CM | POA: Diagnosis not present

## 2020-10-30 DIAGNOSIS — I499 Cardiac arrhythmia, unspecified: Secondary | ICD-10-CM | POA: Diagnosis not present

## 2020-10-30 DIAGNOSIS — I4510 Unspecified right bundle-branch block: Secondary | ICD-10-CM | POA: Diagnosis present

## 2020-10-30 DIAGNOSIS — N186 End stage renal disease: Secondary | ICD-10-CM | POA: Diagnosis not present

## 2020-10-30 DIAGNOSIS — D631 Anemia in chronic kidney disease: Secondary | ICD-10-CM | POA: Diagnosis present

## 2020-10-30 DIAGNOSIS — J449 Chronic obstructive pulmonary disease, unspecified: Secondary | ICD-10-CM | POA: Diagnosis present

## 2020-10-30 DIAGNOSIS — R0602 Shortness of breath: Secondary | ICD-10-CM | POA: Diagnosis not present

## 2020-10-30 DIAGNOSIS — Z85038 Personal history of other malignant neoplasm of large intestine: Secondary | ICD-10-CM

## 2020-10-30 DIAGNOSIS — Z90710 Acquired absence of both cervix and uterus: Secondary | ICD-10-CM

## 2020-10-30 DIAGNOSIS — I517 Cardiomegaly: Secondary | ICD-10-CM | POA: Diagnosis not present

## 2020-10-30 DIAGNOSIS — F32A Depression, unspecified: Secondary | ICD-10-CM | POA: Diagnosis not present

## 2020-10-30 DIAGNOSIS — E669 Obesity, unspecified: Secondary | ICD-10-CM | POA: Diagnosis present

## 2020-10-30 DIAGNOSIS — Z8661 Personal history of infections of the central nervous system: Secondary | ICD-10-CM

## 2020-10-30 DIAGNOSIS — Z20822 Contact with and (suspected) exposure to covid-19: Secondary | ICD-10-CM | POA: Diagnosis present

## 2020-10-30 DIAGNOSIS — F17210 Nicotine dependence, cigarettes, uncomplicated: Secondary | ICD-10-CM | POA: Diagnosis present

## 2020-10-30 DIAGNOSIS — R0789 Other chest pain: Secondary | ICD-10-CM | POA: Diagnosis not present

## 2020-10-30 DIAGNOSIS — R6889 Other general symptoms and signs: Secondary | ICD-10-CM | POA: Diagnosis not present

## 2020-10-30 DIAGNOSIS — Z86718 Personal history of other venous thrombosis and embolism: Secondary | ICD-10-CM

## 2020-10-30 DIAGNOSIS — I2510 Atherosclerotic heart disease of native coronary artery without angina pectoris: Secondary | ICD-10-CM | POA: Diagnosis present

## 2020-10-30 DIAGNOSIS — Z9115 Patient's noncompliance with renal dialysis: Secondary | ICD-10-CM

## 2020-10-30 DIAGNOSIS — E8889 Other specified metabolic disorders: Secondary | ICD-10-CM | POA: Diagnosis not present

## 2020-10-30 DIAGNOSIS — R079 Chest pain, unspecified: Secondary | ICD-10-CM | POA: Diagnosis not present

## 2020-10-30 DIAGNOSIS — N183 Chronic kidney disease, stage 3 unspecified: Secondary | ICD-10-CM | POA: Diagnosis present

## 2020-10-30 DIAGNOSIS — Z86711 Personal history of pulmonary embolism: Secondary | ICD-10-CM

## 2020-10-30 DIAGNOSIS — I21A1 Myocardial infarction type 2: Secondary | ICD-10-CM | POA: Diagnosis present

## 2020-10-30 LAB — BASIC METABOLIC PANEL
Anion gap: 16 — ABNORMAL HIGH (ref 5–15)
BUN: 69 mg/dL — ABNORMAL HIGH (ref 8–23)
CO2: 17 mmol/L — ABNORMAL LOW (ref 22–32)
Calcium: 9.8 mg/dL (ref 8.9–10.3)
Chloride: 98 mmol/L (ref 98–111)
Creatinine, Ser: 12.21 mg/dL — ABNORMAL HIGH (ref 0.44–1.00)
GFR, Estimated: 3 mL/min — ABNORMAL LOW (ref >60)
Glucose, Bld: 98 mg/dL (ref 70–99)
Potassium: >7.5 mmol/L (ref 3.5–5.1)
Sodium: 131 mmol/L — ABNORMAL LOW (ref 135–145)

## 2020-10-30 LAB — CBC WITH DIFFERENTIAL/PLATELET
Abs Immature Granulocytes: 0.04 10*3/uL (ref 0.00–0.07)
Basophils Absolute: 0 10*3/uL (ref 0.0–0.1)
Basophils Relative: 0 %
Eosinophils Absolute: 0.4 10*3/uL (ref 0.0–0.5)
Eosinophils Relative: 3 %
HCT: 35.7 % — ABNORMAL LOW (ref 36.0–46.0)
Hemoglobin: 11.1 g/dL — ABNORMAL LOW (ref 12.0–15.0)
Immature Granulocytes: 0 %
Lymphocytes Relative: 21 %
Lymphs Abs: 2.7 10*3/uL (ref 0.7–4.0)
MCH: 28.8 pg (ref 26.0–34.0)
MCHC: 31.1 g/dL (ref 30.0–36.0)
MCV: 92.7 fL (ref 80.0–100.0)
Monocytes Absolute: 0.8 10*3/uL (ref 0.1–1.0)
Monocytes Relative: 6 %
Neutro Abs: 8.9 10*3/uL — ABNORMAL HIGH (ref 1.7–7.7)
Neutrophils Relative %: 70 %
Platelets: 202 10*3/uL (ref 150–400)
RBC: 3.85 MIL/uL — ABNORMAL LOW (ref 3.87–5.11)
RDW: 15.6 % — ABNORMAL HIGH (ref 11.5–15.5)
WBC: 12.8 10*3/uL — ABNORMAL HIGH (ref 4.0–10.5)
nRBC: 0 % (ref 0.0–0.2)

## 2020-10-30 LAB — POTASSIUM
Potassium: 5.3 mmol/L — ABNORMAL HIGH (ref 3.5–5.1)
Potassium: 5 mmol/L (ref 3.5–5.1)

## 2020-10-30 LAB — MRSA PCR SCREENING: MRSA by PCR: NEGATIVE

## 2020-10-30 LAB — TROPONIN I (HIGH SENSITIVITY)
Troponin I (High Sensitivity): 31 ng/L — ABNORMAL HIGH (ref <18)
Troponin I (High Sensitivity): 138 ng/L (ref <18)
Troponin I (High Sensitivity): 458 ng/L (ref <18)

## 2020-10-30 LAB — RESPIRATORY PANEL BY RT PCR (FLU A&B, COVID)
Influenza A by PCR: NEGATIVE
Influenza B by PCR: NEGATIVE
SARS Coronavirus 2 by RT PCR: NEGATIVE

## 2020-10-30 LAB — BRAIN NATRIURETIC PEPTIDE: B Natriuretic Peptide: 406.5 pg/mL — ABNORMAL HIGH (ref 0.0–100.0)

## 2020-10-30 LAB — MAGNESIUM: Magnesium: 1.9 mg/dL (ref 1.7–2.4)

## 2020-10-30 LAB — LACTIC ACID, PLASMA: Lactic Acid, Venous: 1.7 mmol/L (ref 0.5–1.9)

## 2020-10-30 IMAGING — DX DG CHEST 1V PORT
1 series · 1 of 1 positions shown · non-contrast
Comparison: [DATE]

CLINICAL DATA: Shortness of breath

EXAM:
PORTABLE CHEST 1 VIEW

[chest]
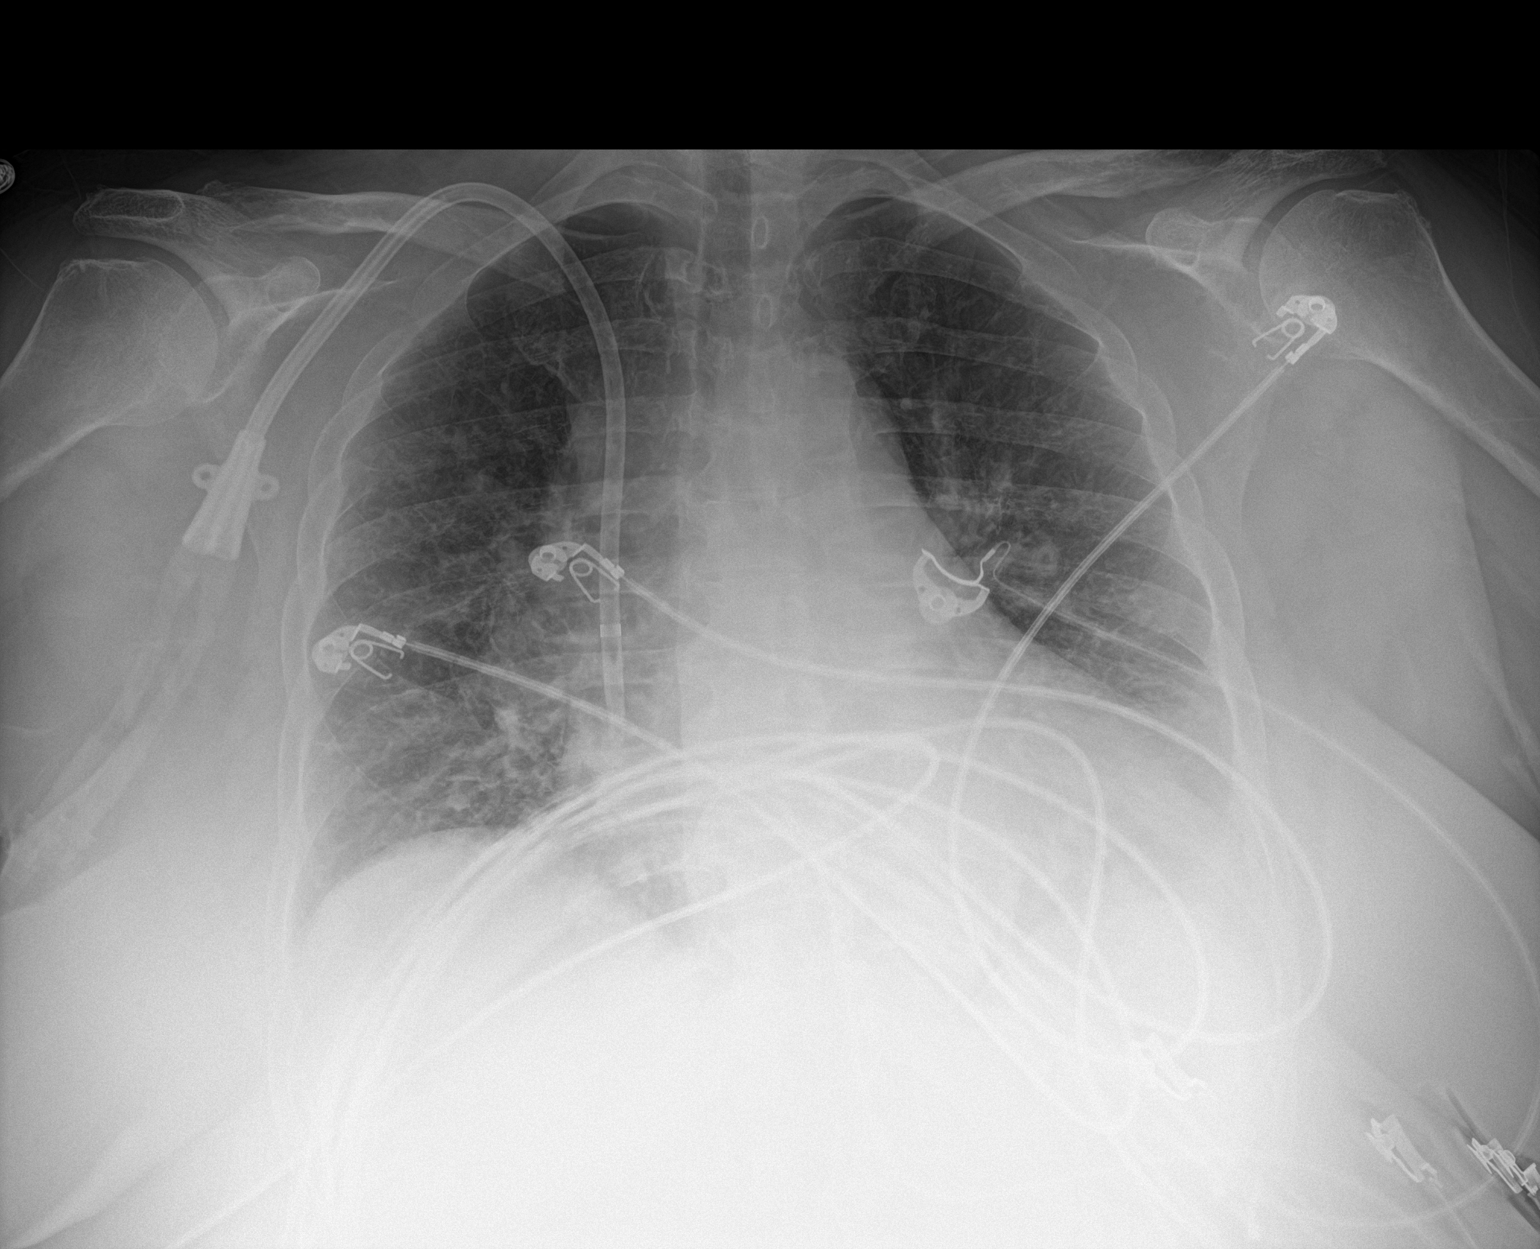

[1 of 1 positions shown; findings below may reference images not displayed]

FINDINGS: Cardiopericardial enlargement and aortic tortuosity. Dialysis
catheter with tip at the upper cavoatrial junction. Extensive
artifact from EKG leads. Diffuse vascular prominence. No [place]
lines, effusion, or pneumothorax.
IMPRESSION: Cardiomegaly and vascular congestion.

## 2020-10-30 MED ORDER — CALCIUM GLUCONATE 10 % IV SOLN: 1.0000 g | Freq: Once | INTRAVENOUS | Status: AC

## 2020-10-30 MED ORDER — AMLODIPINE BESYLATE 5 MG PO TABS
5.0000 mg | ORAL_TABLET | Freq: Every day | ORAL | Status: DC
  Administered 2020-10-30 – 2020-11-01 (×3): 5 mg via ORAL
  Filled 2020-10-30 (×4): qty 1

## 2020-10-30 MED ORDER — HEPARIN SODIUM (PORCINE) 1000 UNIT/ML IJ SOLN
INTRAMUSCULAR | Status: AC
  Filled 2020-10-30: qty 4

## 2020-10-30 MED ORDER — INSULIN ASPART 100 UNIT/ML ~~LOC~~ SOLN
5.0000 [IU] | Freq: Once | SUBCUTANEOUS | Status: AC
  Administered 2020-10-30: 5 [IU] via INTRAVENOUS

## 2020-10-30 MED ORDER — SEVELAMER CARBONATE 800 MG PO TABS
3200.0000 mg | ORAL_TABLET | Freq: Three times a day (TID) | ORAL | Status: DC
  Administered 2020-10-30 – 2020-11-01 (×7): 3200 mg via ORAL
  Filled 2020-10-30 (×8): qty 4

## 2020-10-30 MED ORDER — CALCIUM GLUCONATE 10 % IV SOLN
1.0000 g | Freq: Once | INTRAVENOUS | Status: AC
  Administered 2020-10-30: 1 g via INTRAVENOUS
  Filled 2020-10-30: qty 10

## 2020-10-30 MED ORDER — CALCIUM GLUCONATE 10 % IV SOLN
INTRAVENOUS | Status: AC
  Filled 2020-10-30: qty 10

## 2020-10-30 MED ORDER — CARVEDILOL 12.5 MG PO TABS
12.5000 mg | ORAL_TABLET | ORAL | Status: DC
  Administered 2020-10-31 (×2): 12.5 mg via ORAL
  Filled 2020-10-30 (×2): qty 1

## 2020-10-30 MED ORDER — CALCIUM CHLORIDE 10 % IV SOLN
INTRAVENOUS | Status: AC
  Filled 2020-10-30: qty 10

## 2020-10-30 MED ORDER — CALCIUM CHLORIDE 10 % IV SOLN
INTRAVENOUS | Status: AC
  Administered 2020-10-30: 1000 mg
  Filled 2020-10-30: qty 10

## 2020-10-30 MED ORDER — ACETAMINOPHEN 650 MG RE SUPP: 650.0000 mg | Freq: Four times a day (QID) | RECTAL | Status: DC | PRN

## 2020-10-30 MED ORDER — CHLORHEXIDINE GLUCONATE CLOTH 2 % EX PADS
6.0000 | MEDICATED_PAD | Freq: Every day | CUTANEOUS | Status: DC
  Administered 2020-10-31 – 2020-11-01 (×2): 6 via TOPICAL

## 2020-10-30 MED ORDER — APIXABAN 2.5 MG PO TABS
2.5000 mg | ORAL_TABLET | Freq: Two times a day (BID) | ORAL | Status: DC
  Administered 2020-10-30 – 2020-11-01 (×5): 2.5 mg via ORAL
  Filled 2020-10-30 (×5): qty 1

## 2020-10-30 MED ORDER — DEXTROSE 50 % IV SOLN
25.0000 mL | Freq: Once | INTRAVENOUS | Status: AC
  Administered 2020-10-30: 25 mL via INTRAVENOUS
  Filled 2020-10-30: qty 50

## 2020-10-30 MED ORDER — OXYCODONE-ACETAMINOPHEN 5-325 MG PO TABS
1.0000 | ORAL_TABLET | ORAL | Status: DC | PRN
  Administered 2020-10-31 (×3): 1 via ORAL
  Filled 2020-10-30 (×3): qty 1

## 2020-10-30 MED ORDER — CARVEDILOL 12.5 MG PO TABS
12.5000 mg | ORAL_TABLET | ORAL | Status: DC
  Administered 2020-10-30: 12.5 mg via ORAL
  Filled 2020-10-30: qty 1

## 2020-10-30 MED ORDER — ACETAMINOPHEN 325 MG PO TABS: 650.0000 mg | ORAL_TABLET | Freq: Four times a day (QID) | ORAL | Status: DC | PRN

## 2020-10-30 NOTE — ED Notes (Signed)
Hospitalist  At bedside.

## 2020-10-30 NOTE — Plan of Care (Signed)
  Problem: Education: Goal: Knowledge of General Education information will improve Description: Including pain rating scale, medication(s)/side effects and non-pharmacologic comfort measures Outcome: Progressing   Problem: Health Behavior/Discharge Planning: Goal: Ability to manage health-related needs will improve Outcome: Progressing   Problem: Clinical Measurements: Goal: Ability to maintain clinical measurements within normal limits will improve Outcome: Progressing   Problem: Clinical Measurements: Goal: Cardiovascular complication will be avoided Outcome: Progressing   Problem: Clinical Measurements: Goal: Diagnostic test results will improve Outcome: Progressing   Problem: Activity: Goal: Risk for activity intolerance will decrease Outcome: Progressing   Problem: Nutrition: Goal: Adequate nutrition will be maintained Outcome: Progressing   Problem: Pain Managment: Goal: General experience of comfort will improve Outcome: Progressing

## 2020-10-30 NOTE — Chronic Care Management (AMB) (Signed)
  Chronic Care Management   Inpatient Admit Review Note  10/30/2020 Name: Whitney Walker MRN: [id number] DOB: 1956/12/23  Whitney Walker is a 64 y.o. year old female who is a primary care patient of [name], [place]. Whitney Walker is actively engaged with the embedded care management team in the primary care practice and is being followed by RN Case Manager for assistance with disease management and care coordination needs related to HTN and ESRD.   Whitney Walker is currently admitted to the hospital for evaluation and treatment of Hyperkalemia.   Plan: CM team will collaborate with [facility] and will follow patient post discharge.    Whitney Walker, BSW, CDP Social Worker, Certified Dementia Practitioner [place] [facility] [place] [facility] [phone]

## 2020-10-30 NOTE — ED Notes (Signed)
Patient c/o not being able to breath, heartrate  Increased to 120 patient diaphoretic Dr. Betsey Holiday  At bedside.

## 2020-10-30 NOTE — Plan of Care (Signed)

## 2020-10-30 NOTE — ED Triage Notes (Signed)
Presents to ED via [facility] states she has had a cough for several days c/o increased sob yest , states she is a M-W-F dialysis patient had dialysis on Friday. States she feels like she can't get enough air in. Able to speak in complete sentences.

## 2020-10-30 NOTE — Progress Notes (Signed)
RT note. Pt./ transported to hemodialysis without any complications

## 2020-10-30 NOTE — Procedures (Addendum)
   I was present at this dialysis session, have reviewed the session itself and made  appropriate changes Kelly Splinter MD [place] pager [phone]   10/30/2020, 9:34 AM

## 2020-10-30 NOTE — Consult Note (Signed)
[facility] Renal Consultation Note    Indication for Consultation:  Management of ESRD/hemodialysis, anemia, hypertension/volume, and secondary hyperparathyroidism.  HPI: Whitney Walker is a 64 y.o. female with PMH including ESRD on dialysis MWF, CHF, CAD, DVT, PE, and COPD who presented to the ED on 10/30/20 with shortness of breath. She also reports bilateral lower extremities and right arm felt weak, and she had nausea yesterday. On presentation to the ED, she was placed on BiPAP. CXR showed pulmonary congestion. Labs showed K+ >7.5, EKG with peaked T waves, BUN 69, Scr 12.21, Hgb 11.1. She was treated with calcium gluconate, D50, insulin, and albuterol, and nephrology was consulted for urgent dialysis.   [name] last dialysis was 10/27/20. She completed approximately 3 hours out of prescribed 3.5 hours for her last two treatments. She recently had HCAP in 08/2020 and completed a course of antibiotics. Appears TDC does clot frequently but she has refused a permanent HD access (reports she has had two failed fistulas). She reports [facility] works well depending on which PCT she has at HD. She does admit that she has not been adhering to a renal diet.   Past Medical History:  Diagnosis Date  . Anemia   . Anxiety   . CHF (congestive heart failure) ([place])   . Colon cancer [facility])    treatment surgery  . Complication of anesthesia    after first C- [place] "couldnt walk after", patient denies having a spinal  . COPD (chronic obstructive pulmonary disease) ([place])   . Coronary artery disease   . Depression   . DVT (deep venous thrombosis) ([place])   . ESRD (end stage renal disease) ([place])    Hemo: MWF  . History of blood transfusion 04/2018  . Hypertension    07/07/18- no longer takes BP medications  . Meningitis   . Pain in limb 07/30/2013  . PE (pulmonary embolism)   . Peripheral vascular disease ([place])   . Restless legs   . Shortness of breath    with exertion  . Sleep apnea   .  SOB (shortness of breath) 03/03/2019  . Vertigo    Past Surgical History:  Procedure Laterality Date  . ABDOMINAL AORTAGRAM N/A 07/26/2013   Procedure: ABDOMINAL Maxcine Ham;  Surgeon: [name] Elbert, MD;  Location: [facility] CATH LAB;  Service: Cardiovascular;  Laterality: N/A;  . ABDOMINAL HYSTERECTOMY    . AV FISTULA PLACEMENT Left 05/05/2018   Procedure: ARTERIOVENOUS (AV) FISTULA CREATION BRACHIOCEPHALIC;  Surgeon: Waynetta Sandy, MD;  Location: [place];  Service: Vascular;  Laterality: Left;  . AV FISTULA PLACEMENT Left 07/16/2018   Procedure: ARTERIOVENOUS FISTULA CREATION;  Surgeon: Waynetta Sandy, MD;  Location: [place];  Service: Vascular;  Laterality: Left;  . BASCILIC VEIN TRANSPOSITION Left 09/03/2018   Procedure: BASILIC VEIN TRANSPOSITION SECOND STAGE;  Surgeon: Waynetta Sandy, MD;  Location: [place];  Service: Vascular;  Laterality: Left;  . BREAST BIOPSY Left   . CESAREAN SECTION     X 3 [id number]  . CHOLECYSTECTOMY    . CHOLECYSTECTOMY  1980/s  . COLECTOMY  2010  . DIVERTICULOSIS SURGERY-2002  2012  . FEMORAL-POPLITEAL BYPASS GRAFT Left 08/13/2013   Procedure: BYPASS GRAFT FEMORAL-POPLITEAL ARTERY WITH NON-REVERSED SAPHANEOUS VEIN; ULTRASOUND GUIDED;  Surgeon: Mal [name], MD;  Location: [place];  Service: Vascular;  Laterality: Left;  . INSERTION OF DIALYSIS CATHETER Right 04/30/2018   Procedure: INSERTION OF Right Internal Jugular DIALYSIS CATHETER;  Surgeon: [name], MD;  Location: [facility]  OR;  Service: Vascular;  Laterality: Right;  . IR FLUORO GUIDE CV LINE RIGHT  07/24/2020  . IR REMOVAL TUN CV CATH W/O FL  07/21/2020  . IR US GUIDE VASC ACCESS RIGHT  07/24/2020  . LOWER EXTREMITY ANGIOGRAM Left 07/26/2013   Procedure: LOWER EXTREMITY ANGIOGRAM;  Surgeon: [name] Milltown, MD;  Location: [facility] CATH LAB;  Service: Cardiovascular;  Laterality: Left;   Family History  Problem Relation Age of Onset  . Cancer Mother 66       breast and bone  . Cancer Father 80        prostate  . Hypertension Sister   . Bleeding Disorder Sister   . Cancer Cousin 20       breast cancer   . Hypertension Daughter   . Breast cancer Neg Hx    Social History:  reports that she has been smoking cigarettes. She has been smoking about 0.00 packs per day for the past 40.00 years. She has quit using smokeless tobacco. She reports current drug use. Drug: Oxycodone. She reports that she does not drink alcohol.   Review of Systems: Gen: Reports fatigue and weakness. Denies any fever, chills HEENT: No blurred vision or headache CV: Denies chest pain, angina, palpitations, peripheral edema Resp: Denies SOB at present (on BiPAP), + cough and dyspnea prior to HD GI: Denies nausea, vomiting, abdominal pain, or diarrhea. MS: Reports bilateral lower extremity and R upper extremity weakness  Derm: No reported skin changes Heme: No abnormal bleeding reported  Neuro: No headache, dizziness, + restless legs  Physical Exam: Vitals:   10/30/20 0613 10/30/20 0615 10/30/20 0630 10/30/20 0700  BP: (!) 178/109 (!) 179/106 116/62 113/70  Pulse:    95  Resp: 20 17 13 13   Temp:      TempSrc:      SpO2:    100%  Weight:      Height:         General: Well developed female, alert, comfortable on BiPAP. Head: Normocephalic, atraumatic, sclera non-icteric, mucus membranes are moist. Neck: JVD not elevated. Lungs: Clear bilaterally to auscultation anteriorly without wheezes, rales, or rhonchi.  Heart: RRR with normal S1, S2. No murmurs, rubs, or gallops appreciated. Abdomen: Soft, non-tender, non-distended with normoactive bowel sounds. No rebound/guarding. No obvious abdominal masses. Musculoskeletal: Moves all extremities on command Lower extremities: No edema or ischemic changes, no open wounds. Neuro: Alert and oriented X 3.  Psych:  Responds to questions appropriately with a normal affect. Dialysis Access: R IJ TDC  Allergies  Allergen Reactions  . Carnosine     Other  reaction(s): Unknown  . Gadolinium Derivatives Hives and Other (See Comments)    HIVES, Desc: HIVES W/ "DYE" USED FOR 1ST CT SCAN BUT NOT 2ND, NO PREMEDS USED, PT UNCERTAIN OF CIRCUMSTANCES,,?POSSIBLE MRI CONTRAST ALLERGY, ALL STUDIES DONE "SOMEWHERE" IN [place]//A.C., Onset Date: [id number]  . Iohexol Other (See Comments)     Code: HIVES, Desc: HIVES W/ "DYE" USED FOR 1ST CT SCAN BUT NOT 2ND, NO PREMEDS USED, PT UNCERTAIN OF CIRCUMSTANCES,,?POSSIBLE MRI CONTRAST ALLERGY, ALL STUDIES DONE "SOMEWHERE" IN [place]//A.C., Onset Date: [id number]   . Iodine Hives  . Naltrexone     Other reaction(s): Unknown   Prior to Admission medications   Medication Sig Start Date End Date Taking? Authorizing Provider  acetaminophen (TYLENOL) 500 MG tablet Take 1,000 mg by mouth 2 (two) times daily as needed for moderate pain or headache.    [provider]  albuterol (VENTOLIN HFA)  108 (90 Base) MCG/ACT inhaler Inhale 2 puffs into the lungs every 6 (six) hours as needed for wheezing or shortness of breath. 08/03/20   [name], FNP  amLODipine (NORVASC) 5 MG tablet Take 1 tablet (5 mg total) by mouth daily. 09/28/20 09/28/21  [name], FNP  amoxicillin (AMOXIL) 500 MG tablet Take 1 tablet (500 mg total) by mouth 2 (two) times daily. 09/26/20   [name], FNP  apixaban (ELIQUIS) 2.5 MG TABS tablet Take 1 tablet (2.5 mg total) by mouth 2 (two) times daily. 05/18/20   Truitt Merle, MD  Ascorbic Acid (VITAMIN C) 100 MG tablet Take 100 mg by mouth daily.    [provider]  Biotin 10000 MCG TABS Take 10,000 mcg by mouth daily.     [provider]  carvedilol (COREG) 12.5 MG tablet Take 12.5 mg by mouth See admin instructions. Takes Twice daily except Only takes once a day on dialysis days (holds in morning on Monday Wednesday and Friday)    [provider]  eszopiclone (LUNESTA) 2 MG TABS tablet Take 1 tablet (2 mg total) by mouth at bedtime as needed for sleep. Take  immediately before bedtime Patient not taking: Reported on 09/26/2020 08/03/20 08/03/21  [name], FNP  meclizine (ANTIVERT) 12.5 MG tablet Take 1 tablet (12.5 mg total) by mouth 3 (three) times daily as needed for dizziness. 08/03/20   [name], FNP  multivitamin (RENA-VIT) TABS tablet Take 1 tablet by mouth at bedtime.    Edrick Oh, MD  mupirocin ointment (BACTROBAN) 2 % Apply 1 application topically daily as needed (skin bumps). 04/15/19   [name], FNP  nystatin (NYSTATIN) powder Apply 1 application topically 4 (four) times daily as needed. 08/03/20   [name], FNP  ondansetron (ZOFRAN ODT) 4 MG disintegrating tablet Take 1 tablet (4 mg total) by mouth every 6 (six) hours as needed. Patient not taking: Reported on 08/03/2020 06/08/20   [name], FNP  oxyCODONE-acetaminophen (PERCOCET/ROXICET) 5-325 MG tablet Take 2 tablets by mouth every 4 (four) hours as needed for severe pain. Patient taking differently: Take 1 tablet by mouth every 4 (four) hours as needed for severe pain.  05/08/20   Ward, Delice Bison, DO  sevelamer carbonate (RENVELA) 800 MG tablet Take 3,200 mg by mouth 3 (three) times daily with meals. Take 4 tablets with each meal 10/20/19   [provider]  temazepam (RESTORIL) 15 MG capsule Take 1 capsule (15 mg total) by mouth at bedtime as needed for sleep. Patient not taking: Reported on 08/03/2020 05/04/20   [name], FNP  vitamin E (VITAMIN E) 200 UNIT capsule Take 200 Units by mouth daily.    [provider]   Current Facility-Administered Medications  Medication Dose Route Frequency Provider Last Rate Last Admin  . acetaminophen (TYLENOL) tablet 650 mg  650 mg Oral Q6H PRN Rise Patience, MD       Or  . acetaminophen (TYLENOL) suppository 650 mg  650 mg Rectal Q6H PRN Rise Patience, MD      . amLODipine (NORVASC) tablet 5 mg  5 mg Oral Daily Rise Patience, MD      . apixaban Arne Cleveland) tablet 2.5 mg  2.5 mg Oral BID  Rise Patience, MD      . calcium chloride 10 % injection           . carvedilol (COREG) tablet 12.5 mg  12.5 mg Oral See admin instructions Rise Patience, MD      .  Chlorhexidine Gluconate Cloth 2 % PADS 6 each  6 each Topical Q0600 Rise Patience, MD      . oxyCODONE-acetaminophen (PERCOCET/ROXICET) 5-325 MG per tablet 1 tablet  1 tablet Oral Q4H PRN Rise Patience, MD      . sevelamer carbonate (RENVELA) tablet 3,200 mg  3,200 mg Oral TID WC Rise Patience, MD       Labs: Basic Metabolic Panel: Recent Labs  Lab 10/30/20 0407  NA 131*  K >7.5*  CL 98  CO2 17*  GLUCOSE 98  BUN 69*  CREATININE 12.21*  CALCIUM 9.8   CBC: Recent Labs  Lab 10/30/20 0407  WBC 12.8*  NEUTROABS 8.9*  HGB 11.1*  HCT 35.7*  MCV 92.7  PLT 202   Studies/Results: DG Chest Port 1 View  Result Date: 10/30/2020 CLINICAL DATA:  Shortness of breath EXAM: PORTABLE CHEST 1 VIEW COMPARISON:  09/28/2020 FINDINGS: Cardiopericardial enlargement and aortic tortuosity. Dialysis catheter with tip at the upper cavoatrial junction. Extensive artifact from EKG leads. Diffuse vascular prominence. No Kerley lines, effusion, or pneumothorax. IMPRESSION: Cardiomegaly and vascular congestion. Electronically Signed   By: Monte Fantasia M.D.   On: 10/30/2020 04:23    Dialysis Orders:  Center: [facility]  on MWF. 180NRe, Time: 3hr 30 min, BFR 400, DFR Auto 1.5, EDW 90kg, 2K/2Ca, UF Profile 2, TDC Heparin 6000 unit bolus Calcitriol 2.72mcg PO q HD Mircera 75 cg IVP q 2 weeks- last dose 50mcg on 10/18/20  Assessment/Plan: 1.  Hyperkalemia: Presented to ED with K+ >7.5, given temporizing measures in the ED and now receiving urgent hemodialysis. Possibly combination of shortened dialysis treatments and increased dietary intake of potassium. Consider TDC exchange if catheter continues to clot/K+ remains high. Recheck K+ after HD this afternoon.  2.  ESRD:  Usual MWF schedule,  if K+ is still high tomorrow AM will plan for extra HD. Has TDC and declines permanent access. 3.  Hypertension/volume: BP soft on HD. CXR on admission with pulmonary congestion. Breathing significantly improved on HD. UFG 3-4L today. Likely needs EDW decreased.  4.  Anemia: Hgb 11.1. No indication for ESA at this time.  5.  Metabolic bone disease: Calcium 9.8, will hold VDRA for now and check calcium and albumin in the AM. Check phos with RFP. Current binder is renvela.  6.  Nutrition:  Discussed the importance of following a renal diet (low potassium, low phosphorus, and 1.2L fluid restriction). Check Alb with AM labs.   Anice Paganini, PA-C 10/30/2020, 8:03 AM  [facility] Pager: [phone]

## 2020-10-30 NOTE — Therapy (Signed)
Pt refused cpap unit currently no unit in room 

## 2020-10-30 NOTE — Progress Notes (Addendum)
PROGRESS NOTE    Whitney Walker  LZJ:[id number] DOB: 30-Jul-1956 DOA: 10/30/2020 PCP: [name], FNP   Brief Narrative:  Patient is 64 year old female with past medical history of ESRD on hemodialysis on M WF, hypertension, anemia of chronic disease, 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  Patient is complaining of bilateral lower extremity weakness and gait issues.  She fell yesterday however denies any head or back trauma.  She denies any urinary or bowel incontinence.  In ED: Patient was placed on BiPAP since patient was short of breath and chest x-ray shows congestion.  Labs showed potassium of more than 7.5 with EKG: Peaked T waves and broad QRS complex.  Initial troponin: 31, hemoglobin: 11.1, afebrile with leukocytosis of 12.8.  COVID-19 negative.  Patient was given IV calcium gluconate with D50 with 5 units of insulin and albuterol in ED.  Nephrology was consulted and patient was taken to urgent hemodialysis.  Assessment & Plan:   Severe hyperkalemia: -Likely combination of short and dialysis treatments and increased dietary potassium intake -In the patient with known history of ESRD. -Patient was given IV calcium gluconate, D50 with 5 units of insulin and albuterol in ED. -Nephrology consulted and patient was taken to urgent hemodialysis. -Reviewed EKG.  Acute hypoxemic respiratory failure in the setting of fluid overload.  Initially requiring BiPAP now on nasal cannula.  BNP is elevated: 406.[name]  Chest x-ray shows vascular congestion and cardiomegaly.  No pulmonary edema.  -We will try to wean off of oxygen as tolerated.  Bilateral lower extremity weakness: Improving. -Likely secondary to electrolyte derangement -We will consult PT/OT.  Elevated troponin: -Initial troponin 31, trended up to 138.  Likely demand ischemia in the setting of volume overload secondary to ESRD. -Reviewed EKG.  Trend troponin.  Patient denies ACS  symptoms. -Discussed with cardiology Dr. Karolee Ohs repeat EKG and continue to trend troponin.  Her elevated troponin is likely secondary to type II MI due to underlying CHF.  No cardiac intervention indicated at this time.  Hypertension: Elevated upon arrival -Continue home meds of Coreg and amlodipine and continue to monitor blood pressure closely  Anemia of chronic disease: H&H is stable.  Continue to monitor  History of PE/DVT: Continue Eliquis  DVT prophylaxis: Heparin/SCD  code Status: Full code Family Communication: None present at bedside.  Plan of care discussed with patient in length and she verbalized understanding and agreed with it. Disposition Plan: Likely home on 10/31/2020  Consultants:   Nephrology  Cardiology  Procedures:   Dialysis  Antimicrobials:  None Status is: Observation   Dispo: The patient is from: Home              Anticipated d/c is to: Home              Anticipated d/c date is: 10/31/2020              Patient currently not medically stable for the discharge    Subjective: Patient seen and examined in dialysis unit.  On BiPAP however alert and oriented and communicating well.  Tells me that she has bilateral lower extremity weakness, has gait instability and fell yesterday as she had no strength in her legs.  She denies any urinary or bowel incontinence.  Objective: Vitals:   10/30/20 1133 10/30/20 1200 10/30/20 1201 10/30/20 1612  BP:   (!) 158/79 (!) 144/80  Pulse: 95 95 97 98  Resp:   16 19  Temp:  98.7  F (37.1 C) 97.9 F (36.6 C) 98.1 F (36.7 C)  TempSrc:  Oral Oral Oral  SpO2: 100%  100% 100%  Weight:      Height:        Intake/Output Summary (Last 24 hours) at 10/30/2020 1625 Last data filed at 10/30/2020 1030 Gross per 24 hour  Intake --  Output 1475 ml  Net -1475 ml   Filed Weights   10/30/20 0425  Weight: 90.7 kg    Examination:  General exam: Appears calm and comfortable, on BiPAP, obese, appears  weak Respiratory system: Clear to auscultation. Respiratory effort normal. Cardiovascular system: S1 & S2 heard, RRR. No JVD, murmurs, rubs, gallops or clicks. No pedal edema. Gastrointestinal system: Abdomen is nondistended, soft and nontender. No organomegaly or masses felt. Normal bowel sounds heard. Central nervous system: Alert and oriented.  Power 2 out of 5 in bilateral lower extremities.   Skin: No rashes, lesions or ulcers Psychiatry: Judgement and insight appear normal. Mood & affect appropriate.    Data Reviewed: I have personally reviewed following labs and imaging studies  CBC: Recent Labs  Lab 10/30/20 0407  WBC 12.8*  NEUTROABS 8.9*  HGB 11.1*  HCT 35.7*  MCV 92.7  PLT 867   Basic Metabolic Panel: Recent Labs  Lab 10/30/20 0407 10/30/20 0603  NA 131*  --   K >7.5* 5.3*  CL 98  --   CO2 17*  --   GLUCOSE 98  --   BUN 69*  --   CREATININE 12.21*  --   CALCIUM 9.8  --    GFR: Estimated Creatinine Clearance: 4.8 mL/min (A) (by C-G formula based on SCr of 12.21 mg/dL (H)). Liver Function Tests: No results for input(s): AST, ALT, ALKPHOS, BILITOT, PROT, ALBUMIN in the last 168 hours. No results for input(s): LIPASE, AMYLASE in the last 168 hours. No results for input(s): AMMONIA in the last 168 hours. Coagulation Profile: No results for input(s): INR, PROTIME in the last 168 hours. Cardiac Enzymes: No results for input(s): CKTOTAL, CKMB, CKMBINDEX, TROPONINI in the last 168 hours. BNP (last 3 results) No results for input(s): PROBNP in the last 8760 hours. HbA1C: No results for input(s): HGBA1C in the last 72 hours. CBG: No results for input(s): GLUCAP in the last 168 hours. Lipid Profile: No results for input(s): CHOL, HDL, LDLCALC, TRIG, CHOLHDL, LDLDIRECT in the last 72 hours. Thyroid Function Tests: No results for input(s): TSH, T4TOTAL, FREET4, T3FREE, THYROIDAB in the last 72 hours. Anemia Panel: No results for input(s): VITAMINB12, FOLATE,  FERRITIN, TIBC, IRON, RETICCTPCT in the last 72 hours. Sepsis Labs: Recent Labs  Lab 10/30/20 0407  LATICACIDVEN 1.7    Recent Results (from the past 240 hour(s))  Respiratory Panel by RT PCR (Flu A&B, Covid) - Nasopharyngeal Swab     Status: None   Collection Time: 10/30/20  4:49 AM   Specimen: Nasopharyngeal Swab  Result Value Ref Range Status   SARS Coronavirus 2 by RT PCR NEGATIVE NEGATIVE Final    Comment: (NOTE) SARS-CoV-2 target nucleic acids are NOT DETECTED.  The SARS-CoV-2 RNA is generally detectable in upper respiratoy specimens during the acute phase of infection. The lowest concentration of SARS-CoV-2 viral copies this assay can detect is 131 copies/mL. A negative result does not preclude SARS-Cov-2 infection and should not be used as the sole basis for treatment or other patient management decisions. A negative result may occur with  improper specimen collection/handling, submission of specimen other than nasopharyngeal swab, presence of  viral mutation(s) within the areas targeted by this assay, and inadequate number of viral copies (<131 copies/mL). A negative result must be combined with clinical observations, patient history, and epidemiological information. The expected result is Negative.  Fact Sheet for Patients:  PinkCheek.be  Fact Sheet for Healthcare Providers:  GravelBags.it  This test is no t yet approved or cleared by the [place] FDA and  has been authorized for detection and/or diagnosis of SARS-CoV-2 by FDA under an Emergency Use Authorization (EUA). This EUA will remain  in effect (meaning this test can be used) for the duration of the COVID-19 declaration under Section 564(b)(1) of the Act, 21 U.S.C. section 360bbb-3(b)(1), unless the authorization is terminated or revoked sooner.     Influenza A by PCR NEGATIVE NEGATIVE Final   Influenza B by PCR NEGATIVE NEGATIVE Final     Comment: (NOTE) The Xpert Xpress SARS-CoV-2/FLU/RSV assay is intended as an aid in  the diagnosis of influenza from Nasopharyngeal swab specimens and  should not be used as a sole basis for treatment. Nasal washings and  aspirates are unacceptable for Xpert Xpress SARS-CoV-2/FLU/RSV  testing.  Fact Sheet for Patients: PinkCheek.be  Fact Sheet for Healthcare Providers: GravelBags.it  This test is not yet approved or cleared by the [place] FDA and  has been authorized for detection and/or diagnosis of SARS-CoV-2 by  FDA under an Emergency Use Authorization (EUA). This EUA will remain  in effect (meaning this test can be used) for the duration of the  Covid-19 declaration under Section 564(b)(1) of the Act, 21  U.S.C. section 360bbb-3(b)(1), unless the authorization is  terminated or revoked. Performed at [facility], [place] [address], [place], [place] [address]       Radiology Studies: DG Chest Port 1 View  Result Date: 10/30/2020 CLINICAL DATA:  Shortness of breath EXAM: PORTABLE CHEST 1 VIEW COMPARISON:  09/28/2020 FINDINGS: Cardiopericardial enlargement and aortic tortuosity. Dialysis catheter with tip at the upper cavoatrial junction. Extensive artifact from EKG leads. Diffuse vascular prominence. No Kerley lines, effusion, or pneumothorax. IMPRESSION: Cardiomegaly and vascular congestion. Electronically Signed   By: Monte Fantasia M.D.   On: 10/30/2020 04:23    Scheduled Meds: . amLODipine  5 mg Oral Daily  . apixaban  2.5 mg Oral BID  . calcium chloride      . carvedilol  12.5 mg Oral Once per day on Mon Wed Fri  . [START ON 10/31/2020] carvedilol  12.5 mg Oral 2 times per day on Sun Tue Thu Sat  . Chlorhexidine Gluconate Cloth  6 each Topical Q0600  . heparin sodium (porcine)      . sevelamer carbonate  3,200 mg Oral TID WC   Continuous Infusions:   LOS: 0 days   Time spent: 40 minutes  Gwin Eagon Loann Quill, MD [facility]  If 7PM-7AM, please contact night-coverage [email or web address] 10/30/2020, 4:25 PM

## 2020-10-30 NOTE — Consult Note (Signed)
   [facility] CM Inpatient Consult   10/30/2020  Daryl Quiros Jan 14, 1956 [id number]   [place] [facility] [ACO] Patient: [employer]  Primary Care Provider:  [facility] Embedded at [facility]   Patient is to have the transition of care call conducted by the primary care provider. This patient is also in the Embedded practice chronic disease management program with the Embedded Care Management team.  Plan: Received notification sent from the [place] Management social worker and will send updates on disposition/transitional needs.  Please contact for further questions,  Natividad Brood, RN BSN [facility] Liaison  [phone] business mobile phone Toll free office [phone]  Fax number: [phone] [email or web address]

## 2020-10-30 NOTE — H&P (Signed)
History and Physical    Whitney Walker ZJI:[id number] DOB: October 02, 1956 DOA: 10/30/2020  PCP: [name], FNP  Patient coming from: Home.  Chief Complaint: Shortness of breath.  HPI: Whitney Walker is a 64 y.o. female with history of ESRD on hemodialysis on Monday Wednesday Friday, hypertension, chronic anemia presents to the ER with complaint of shortness of breath.  Patient states she had a regular dialysis on Friday 3 days ago the following day on Saturday patient started having shortness of breath and some nonproductive cough.  Some chest pressure.  Her symptoms progressively got worse and later in the evening on October 29, 2020 patient decided to come to the ER.  Patient does complain of lower extremity weakness.  But denies any pain or any upper extremity weakness.  ED Course: In the ER patient was initially placed on BiPAP since patient was short of breath and chest x-ray showing congestion.  Labs show potassium of more than 7.5 with EKG showing peaked T waves and broad QRS patient was given calcium gluconate D50 insulin albuterol and nephrology was consulted and patient was taken for urgent dialysis.  High sensitive troponin was 31.  Hemoglobin is 11.1 WBC 12.8 Covid test was negative.  Review of Systems: As per HPI, rest all negative.   Past Medical History:  Diagnosis Date  . Anemia   . Anxiety   . CHF (congestive heart failure) ([place])   . Colon cancer [facility])    treatment surgery  . Complication of anesthesia    after first C- [place] "couldnt walk after", patient denies having a spinal  . COPD (chronic obstructive pulmonary disease) ([place])   . Coronary artery disease   . Depression   . DVT (deep venous thrombosis) ([place])   . ESRD (end stage renal disease) ([place])    Hemo: MWF  . History of blood transfusion 04/2018  . Hypertension    07/07/18- no longer takes BP medications  . Meningitis   . Pain in limb 07/30/2013  . PE (pulmonary embolism)   . Peripheral  vascular disease ([place])   . Restless legs   . Shortness of breath    with exertion  . Sleep apnea   . SOB (shortness of breath) 03/03/2019  . Vertigo     Past Surgical History:  Procedure Laterality Date  . ABDOMINAL AORTAGRAM N/A 07/26/2013   Procedure: ABDOMINAL Maxcine Ham;  Surgeon: [name] Antioch, MD;  Location[facility] CATH LAB;  Service: Cardiovascular;  Laterality: N/A;  . ABDOMINAL HYSTERECTOMY    . AV FISTULA PLACEMENT Left 05/05/2018   Procedure: ARTERIOVENOUS (AV) FISTULA CREATION BRACHIOCEPHALIC;  Surgeon: Waynetta Sandy, MD;  Location: [place];  Service: Vascular;  Laterality: Left;  . AV FISTULA PLACEMENT Left 07/16/2018   Procedure: ARTERIOVENOUS FISTULA CREATION;  Surgeon: Waynetta Sandy, MD;  Location: [place];  Service: Vascular;  Laterality: Left;  . BASCILIC VEIN TRANSPOSITION Left 09/03/2018   Procedure: BASILIC VEIN TRANSPOSITION SECOND STAGE;  Surgeon: Waynetta Sandy, MD;  Location: [place];  Service: Vascular;  Laterality: Left;  . BREAST BIOPSY Left   . CESAREAN SECTION     X 3 [id number]  . CHOLECYSTECTOMY    . CHOLECYSTECTOMY  1980/s  . COLECTOMY  2010  . DIVERTICULOSIS SURGERY-2002  2012  . FEMORAL-POPLITEAL BYPASS GRAFT Left 08/13/2013   Procedure: BYPASS GRAFT FEMORAL-POPLITEAL ARTERY WITH NON-REVERSED SAPHANEOUS VEIN; ULTRASOUND GUIDED;  Surgeon: Mal [name], MD;  Location: [place];  Service: Vascular;  Laterality: Left;  .  INSERTION OF DIALYSIS CATHETER Right 04/30/2018   Procedure: INSERTION OF Right Internal Jugular DIALYSIS CATHETER;  Surgeon: [name], MD;  Location: [place];  Service: Vascular;  Laterality: Right;  . IR FLUORO GUIDE CV LINE RIGHT  07/24/2020  . IR REMOVAL TUN CV CATH W/O FL  07/21/2020  . IR US GUIDE VASC ACCESS RIGHT  07/24/2020  . LOWER EXTREMITY ANGIOGRAM Left 07/26/2013   Procedure: LOWER EXTREMITY ANGIOGRAM;  Surgeon: [name] Milan, MD;  Location: [facility] CATH LAB;  Service: Cardiovascular;  Laterality: Left;      reports that she has been smoking cigarettes. She has been smoking about 0.00 packs per day for the past 40.00 years. She has quit using smokeless tobacco. She reports current drug use. Drug: Oxycodone. She reports that she does not drink alcohol.  Allergies  Allergen Reactions  . Carnosine     Other reaction(s): Unknown  . Gadolinium Derivatives Hives and Other (See Comments)    HIVES, Desc: HIVES W/ "DYE" USED FOR 1ST CT SCAN BUT NOT 2ND, NO PREMEDS USED, PT UNCERTAIN OF CIRCUMSTANCES,,?POSSIBLE MRI CONTRAST ALLERGY, ALL STUDIES DONE "SOMEWHERE" IN [place]//A.C., Onset Date: [id number]  . Iohexol Other (See Comments)     Code: HIVES, Desc: HIVES W/ "DYE" USED FOR 1ST CT SCAN BUT NOT 2ND, NO PREMEDS USED, PT UNCERTAIN OF CIRCUMSTANCES,,?POSSIBLE MRI CONTRAST ALLERGY, ALL STUDIES DONE "SOMEWHERE" IN [place]//A.C., Onset Date: [id number]   . Iodine Hives  . Naltrexone     Other reaction(s): Unknown    Family History  Problem Relation Age of Onset  . Cancer Mother 44       breast and bone  . Cancer Father 74       prostate  . Hypertension Sister   . Bleeding Disorder Sister   . Cancer Cousin 20       breast cancer   . Hypertension Daughter   . Breast cancer Neg Hx     Prior to Admission medications   Medication Sig Start Date End Date Taking? Authorizing Provider  acetaminophen (TYLENOL) 500 MG tablet Take 1,000 mg by mouth 2 (two) times daily as needed for moderate pain or headache.    [provider]  albuterol (VENTOLIN HFA) 108 (90 Base) MCG/ACT inhaler Inhale 2 puffs into the lungs every 6 (six) hours as needed for wheezing or shortness of breath. 08/03/20   [name], FNP  amLODipine (NORVASC) 5 MG tablet Take 1 tablet (5 mg total) by mouth daily. 09/28/20 09/28/21  [name], FNP  amoxicillin (AMOXIL) 500 MG tablet Take 1 tablet (500 mg total) by mouth 2 (two) times daily. 09/26/20   [name], FNP  apixaban (ELIQUIS) 2.5 MG TABS tablet Take 1 tablet  (2.5 mg total) by mouth 2 (two) times daily. 05/18/20   Truitt Merle, MD  Ascorbic Acid (VITAMIN C) 100 MG tablet Take 100 mg by mouth daily.    [provider]  Biotin 10000 MCG TABS Take 10,000 mcg by mouth daily.     [provider]  carvedilol (COREG) 12.5 MG tablet Take 12.5 mg by mouth See admin instructions. Takes Twice daily except Only takes once a day on dialysis days (holds in morning on Monday Wednesday and Friday)    [provider]  eszopiclone (LUNESTA) 2 MG TABS tablet Take 1 tablet (2 mg total) by mouth at bedtime as needed for sleep. Take immediately before bedtime Patient not taking: Reported on 09/26/2020 08/03/20 08/03/21  [name], FNP  meclizine (ANTIVERT) 12.5  MG tablet Take 1 tablet (12.5 mg total) by mouth 3 (three) times daily as needed for dizziness. 08/03/20   [name], FNP  multivitamin (RENA-VIT) TABS tablet Take 1 tablet by mouth at bedtime.    Edrick Oh, MD  mupirocin ointment (BACTROBAN) 2 % Apply 1 application topically daily as needed (skin bumps). 04/15/19   [name], FNP  nystatin (NYSTATIN) powder Apply 1 application topically 4 (four) times daily as needed. 08/03/20   [name], FNP  ondansetron (ZOFRAN ODT) 4 MG disintegrating tablet Take 1 tablet (4 mg total) by mouth every 6 (six) hours as needed. Patient not taking: Reported on 08/03/2020 06/08/20   [name], FNP  oxyCODONE-acetaminophen (PERCOCET/ROXICET) 5-325 MG tablet Take 2 tablets by mouth every 4 (four) hours as needed for severe pain. Patient taking differently: Take 1 tablet by mouth every 4 (four) hours as needed for severe pain.  05/08/20   Ward, Delice Bison, DO  sevelamer carbonate (RENVELA) 800 MG tablet Take 3,200 mg by mouth 3 (three) times daily with meals. Take 4 tablets with each meal 10/20/19   [provider]  temazepam (RESTORIL) 15 MG capsule Take 1 capsule (15 mg total) by mouth at bedtime as needed for sleep. Patient not taking: Reported on  08/03/2020 05/04/20   [name], FNP  vitamin E (VITAMIN E) 200 UNIT capsule Take 200 Units by mouth daily.    [provider]    Physical Exam: Constitutional: Moderately built and nourished. Vitals:   10/30/20 0500 10/30/20 0613 10/30/20 0615 10/30/20 0630  BP: (!) 163/74 (!) (P) 178/109 (!) (P) 179/106 116/62  Pulse: 84     Resp: 14 (P) 20 (P) 17 13  Temp:      TempSrc:      SpO2: 100%     Weight:      Height:       Eyes: Anicteric no pallor. ENMT: No discharge from the ears eyes nose or mouth. Neck: No mass felt.  No neck rigidity. Respiratory: No rhonchi or crepitations. Cardiovascular: S1-S2 heard. Abdomen: Soft nontender bowel sounds present. Musculoskeletal: No edema. Skin: No rash. Neurologic: Alert awake oriented time place and person.  Complains of mild weakness of both lower extremities. Psychiatric: Appears normal.  Normal affect.   Labs on Admission: I have personally reviewed following labs and imaging studies  CBC: Recent Labs  Lab 10/30/20 0407  WBC 12.8*  NEUTROABS 8.9*  HGB 11.1*  HCT 35.7*  MCV 92.7  PLT 193   Basic Metabolic Panel: Recent Labs  Lab 10/30/20 0407  NA 131*  K >7.5*  CL 98  CO2 17*  GLUCOSE 98  BUN 69*  CREATININE 12.21*  CALCIUM 9.8   GFR: Estimated Creatinine Clearance: 4.8 mL/min (A) (by C-G formula based on SCr of 12.21 mg/dL (H)). Liver Function Tests: No results for input(s): AST, ALT, ALKPHOS, BILITOT, PROT, ALBUMIN in the last 168 hours. No results for input(s): LIPASE, AMYLASE in the last 168 hours. No results for input(s): AMMONIA in the last 168 hours. Coagulation Profile: No results for input(s): INR, PROTIME in the last 168 hours. Cardiac Enzymes: No results for input(s): CKTOTAL, CKMB, CKMBINDEX, TROPONINI in the last 168 hours. BNP (last 3 results) No results for input(s): PROBNP in the last 8760 hours. HbA1C: No results for input(s): HGBA1C in the last 72 hours. CBG: No results for  input(s): GLUCAP in the last 168 hours. Lipid Profile: No results for input(s): CHOL, HDL, LDLCALC, TRIG, CHOLHDL, LDLDIRECT in the  last 72 hours. Thyroid Function Tests: No results for input(s): TSH, T4TOTAL, FREET4, T3FREE, THYROIDAB in the last 72 hours. Anemia Panel: No results for input(s): VITAMINB12, FOLATE, FERRITIN, TIBC, IRON, RETICCTPCT in the last 72 hours. Urine analysis:    Component Value Date/Time   COLORURINE YELLOW 03/02/2019 1416   APPEARANCEUR CLEAR 03/02/2019 1416   LABSPEC 1.010 03/02/2019 1416   PHURINE 6.0 03/02/2019 1416   GLUCOSEU 50 (A) 03/02/2019 1416   HGBUR SMALL (A) 03/02/2019 1416   BILIRUBINUR NEGATIVE 03/02/2019 1416   KETONESUR 5 (A) 03/02/2019 1416   PROTEINUR 100 (A) 03/02/2019 1416   UROBILINOGEN 0.2 10/19/2015 1532   NITRITE NEGATIVE 03/02/2019 1416   LEUKOCYTESUR NEGATIVE 03/02/2019 1416   Sepsis Labs: @LABRCNTIP (procalcitonin:4,lacticidven:4) ) Recent Results (from the past 240 hour(s))  Respiratory Panel by RT PCR (Flu A&B, Covid) - Nasopharyngeal Swab     Status: None   Collection Time: 10/30/20  4:49 AM   Specimen: Nasopharyngeal Swab  Result Value Ref Range Status   SARS Coronavirus 2 by RT PCR NEGATIVE NEGATIVE Final    Comment: (NOTE) SARS-CoV-2 target nucleic acids are NOT DETECTED.  The SARS-CoV-2 RNA is generally detectable in upper respiratoy specimens during the acute phase of infection. The lowest concentration of SARS-CoV-2 viral copies this assay can detect is 131 copies/mL. A negative result does not preclude SARS-Cov-2 infection and should not be used as the sole basis for treatment or other patient management decisions. A negative result may occur with  improper specimen collection/handling, submission of specimen other than nasopharyngeal swab, presence of viral mutation(s) within the areas targeted by this assay, and inadequate number of viral copies (<131 copies/mL). A negative result must be combined with  clinical observations, patient history, and epidemiological information. The expected result is Negative.  Fact Sheet for Patients:  PinkCheek.be  Fact Sheet for Healthcare Providers:  GravelBags.it  This test is no t yet approved or cleared by the [place] FDA and  has been authorized for detection and/or diagnosis of SARS-CoV-2 by FDA under an Emergency Use Authorization (EUA). This EUA will remain  in effect (meaning this test can be used) for the duration of the COVID-19 declaration under Section 564(b)(1) of the Act, 21 U.S.C. section 360bbb-3(b)(1), unless the authorization is terminated or revoked sooner.     Influenza A by PCR NEGATIVE NEGATIVE Final   Influenza B by PCR NEGATIVE NEGATIVE Final    Comment: (NOTE) The Xpert Xpress SARS-CoV-2/FLU/RSV assay is intended as an aid in  the diagnosis of influenza from Nasopharyngeal swab specimens and  should not be used as a sole basis for treatment. Nasal washings and  aspirates are unacceptable for Xpert Xpress SARS-CoV-2/FLU/RSV  testing.  Fact Sheet for Patients: PinkCheek.be  Fact Sheet for Healthcare Providers: GravelBags.it  This test is not yet approved or cleared by the [place] FDA and  has been authorized for detection and/or diagnosis of SARS-CoV-2 by  FDA under an Emergency Use Authorization (EUA). This EUA will remain  in effect (meaning this test can be used) for the duration of the  Covid-19 declaration under Section 564(b)(1) of the Act, 21  U.S.C. section 360bbb-3(b)(1), unless the authorization is  terminated or revoked. Performed at [facility], [place] [address] [place], [place] [address]      Radiological Exams on Admission: DG Chest Port 1 View  Result Date: 10/30/2020 CLINICAL DATA:  Shortness of breath EXAM: PORTABLE CHEST 1 VIEW COMPARISON:  09/28/2020 FINDINGS:  Cardiopericardial enlargement and aortic  tortuosity. Dialysis catheter with tip at the upper cavoatrial junction. Extensive artifact from EKG leads. Diffuse vascular prominence. No Kerley lines, effusion, or pneumothorax. IMPRESSION: Cardiomegaly and vascular congestion. Electronically Signed   By: Monte Fantasia M.D.   On: 10/30/2020 04:23    EKG: Independently reviewed.  Broad QRS complex with peaked T waves.  Assessment/Plan Principal Problem:   Hyperkalemia Active Problems:   Chronic diastolic heart failure (HCC)   Essential hypertension   HTN (hypertension)   Anemia of chronic kidney failure, stage 3 (moderate) (HCC)    1. Severe hyperkalemia with respiratory failure on BiPAP in the patient with known history of ESRD -could be from dietary indiscretion.  Nephrology has been consulted and patient is being taken for urgent dialysis.  Follow labs after dialysis.  Patient was given calcium gluconate D50 insulin and albuterol in the ER. 2. Bilateral lower extremity weakness -denies any low back pain or any incontinence of urine.  Reexamination patient's weakness after correction of potassium.  If still persist may need further imaging. 3. Hypertension on Coreg and amlodipine. 4. Chronic anemia follow CBC. 5. History of DVT on apixaban.   DVT prophylaxis: Apixaban. Code Status: Full code. Family Communication: Discussed with patient. Disposition Plan: Home. Consults called: Nephrology. Admission status: Observation.   Rise Patience MD [facility] Pager [phone].  If 7PM-7AM, please contact night-coverage [email or web address] Password [facility]  10/30/2020, 6:53 AM

## 2020-10-30 NOTE — ED Provider Notes (Signed)
[name] DEPARTMENT Provider Note   CSN: [id number] Arrival date & time: 10/30/20  1962     History Chief Complaint  Patient presents with  . Shortness of Breath    Whitney Walker is a 64 y.o. female.  Patient presents to the emergency department for evaluation of shortness of breath.  Patient reports that she has been having difficulty breathing for the last 2 days but symptoms worsened tonight.  She has not been able to get to sleep because of her breathing difficulty.  She does not have any associated chest pain.  She has not noticed any fever.  She has had a dry, nonproductive cough.  Patient reports that she has been vaccinated for Covid.        Past Medical History:  Diagnosis Date  . Anemia   . Anxiety   . CHF (congestive heart failure) ([place])   . Colon cancer [facility])    treatment surgery  . Complication of anesthesia    after first C- [place] "couldnt walk after", patient denies having a spinal  . COPD (chronic obstructive pulmonary disease) ([place])   . Coronary artery disease   . Depression   . DVT (deep venous thrombosis) ([place])   . ESRD (end stage renal disease) ([place])    Hemo: MWF  . History of blood transfusion 04/2018  . Hypertension    07/07/18- no longer takes BP medications  . Meningitis   . Pain in limb 07/30/2013  . PE (pulmonary embolism)   . Peripheral vascular disease ([place])   . Restless legs   . Shortness of breath    with exertion  . Sleep apnea   . SOB (shortness of breath) 03/03/2019  . Vertigo     Patient Active Problem List   Diagnosis Date Noted  . Hyperkalemia 05/09/2020  . Acute respiratory failure with hypoxia ([place]) 05/09/2020  . Left leg pain 05/09/2020  . Restless leg syndrome 09/19/2018  . OSA (obstructive sleep apnea) 04/29/2018  . ESRD on dialysis ([place]) 04/29/2018  . Dyspnea on exertion 03/12/2017  . Morbid (severe) obesity due to excess calories ([place]) 03/12/2017  . Anemia of chronic kidney  failure, stage 3 (moderate) ([place]) 12/10/2016  . History of colon cancer 08/12/2016  . Mass of left breast on mammogram 12/10/2015  . HTN (hypertension) 10/18/2015  . Depression 10/18/2015  . Numbness and tingling of left arm and leg 01/24/2015  . PVD (peripheral vascular disease) with claudication ([place]) 03/23/2014  . PAD (peripheral artery disease) ([place]) 12/14/2013  . Chronic diastolic heart failure ([place]) 11/15/2013  . Essential hypertension 11/15/2013  . Leucocytosis 04/28/2012    Past Surgical History:  Procedure Laterality Date  . ABDOMINAL AORTAGRAM N/A 07/26/2013   Procedure: ABDOMINAL Maxcine Ham;  Surgeon: [name] [place], MD;  Location: [facility] CATH LAB;  Service: Cardiovascular;  Laterality: N/A;  . ABDOMINAL HYSTERECTOMY    . AV FISTULA PLACEMENT Left 05/05/2018   Procedure: ARTERIOVENOUS (AV) FISTULA CREATION BRACHIOCEPHALIC;  Surgeon: Waynetta Sandy, MD;  Location: [place];  Service: Vascular;  Laterality: Left;  . AV FISTULA PLACEMENT Left 07/16/2018   Procedure: ARTERIOVENOUS FISTULA CREATION;  Surgeon: Waynetta Sandy, MD;  Location: [place];  Service: Vascular;  Laterality: Left;  . BASCILIC VEIN TRANSPOSITION Left 09/03/2018   Procedure: BASILIC VEIN TRANSPOSITION SECOND STAGE;  Surgeon: Waynetta Sandy, MD;  Location: [place];  Service: Vascular;  Laterality: Left;  . BREAST BIOPSY Left   . CESAREAN SECTION  X 3 [id number]  . CHOLECYSTECTOMY    . CHOLECYSTECTOMY  1980/s  . COLECTOMY  2010  . DIVERTICULOSIS SURGERY-2002  2012  . FEMORAL-POPLITEAL BYPASS GRAFT Left 08/13/2013   Procedure: BYPASS GRAFT FEMORAL-POPLITEAL ARTERY WITH NON-REVERSED SAPHANEOUS VEIN; ULTRASOUND GUIDED;  Surgeon: Mal [name], MD;  Location: [place];  Service: Vascular;  Laterality: Left;  . INSERTION OF DIALYSIS CATHETER Right 04/30/2018   Procedure: INSERTION OF Right Internal Jugular DIALYSIS CATHETER;  Surgeon: [name], MD;  Location: [place];  Service: Vascular;   Laterality: Right;  . IR FLUORO GUIDE CV LINE RIGHT  07/24/2020  . IR REMOVAL TUN CV CATH W/O FL  07/21/2020  . IR US GUIDE VASC ACCESS RIGHT  07/24/2020  . LOWER EXTREMITY ANGIOGRAM Left 07/26/2013   Procedure: LOWER EXTREMITY ANGIOGRAM;  Surgeon: [name] [place], MD;  Location: [facility] CATH LAB;  Service: Cardiovascular;  Laterality: Left;     OB History   No obstetric history on file.     Family History  Problem Relation Age of Onset  . Cancer Mother 79       breast and bone  . Cancer Father 19       prostate  . Hypertension Sister   . Bleeding Disorder Sister   . Cancer Cousin 20       breast cancer   . Hypertension Daughter   . Breast cancer Neg Hx     Social History   Tobacco Use  . Smoking status: Current Some Day Smoker    Packs/day: 0.00    Years: 40.00    Pack years: 0.00    Types: Cigarettes  . Smokeless tobacco: Former Systems developer  . Tobacco comment: has an occasional cigarette - mostly when around people  Vaping Use  . Vaping Use: Never used  Substance Use Topics  . Alcohol use: No    Alcohol/week: 0.0 standard drinks    Comment: she used to drink alcohol, quit in 2010   . Drug use: Yes    Types: Oxycodone    Home Medications Prior to Admission medications   Medication Sig Start Date End Date Taking? Authorizing Provider  acetaminophen (TYLENOL) 500 MG tablet Take 1,000 mg by mouth 2 (two) times daily as needed for moderate pain or headache.    [provider]  albuterol (VENTOLIN HFA) 108 (90 Base) MCG/ACT inhaler Inhale 2 puffs into the lungs every 6 (six) hours as needed for wheezing or shortness of breath. 08/03/20   [name], FNP  amLODipine (NORVASC) 5 MG tablet Take 1 tablet (5 mg total) by mouth daily. 09/28/20 09/28/21  [name], FNP  amoxicillin (AMOXIL) 500 MG tablet Take 1 tablet (500 mg total) by mouth 2 (two) times daily. 09/26/20   [name], FNP  apixaban (ELIQUIS) 2.5 MG TABS tablet Take 1 tablet (2.5 mg total) by mouth 2 (two) times  daily. 05/18/20   Truitt Merle, MD  Ascorbic Acid (VITAMIN C) 100 MG tablet Take 100 mg by mouth daily.    [provider]  Biotin 10000 MCG TABS Take 10,000 mcg by mouth daily.     [provider]  carvedilol (COREG) 12.5 MG tablet Take 12.5 mg by mouth See admin instructions. Takes Twice daily except Only takes once a day on dialysis days (holds in morning on Monday Wednesday and Friday)    [provider]  eszopiclone (LUNESTA) 2 MG TABS tablet Take 1 tablet (2 mg total) by mouth at bedtime as needed for sleep.  Take immediately before bedtime Patient not taking: Reported on 09/26/2020 08/03/20 08/03/21  [name], FNP  meclizine (ANTIVERT) 12.5 MG tablet Take 1 tablet (12.5 mg total) by mouth 3 (three) times daily as needed for dizziness. 08/03/20   [name], FNP  multivitamin (RENA-VIT) TABS tablet Take 1 tablet by mouth at bedtime.    Edrick Oh, MD  mupirocin ointment (BACTROBAN) 2 % Apply 1 application topically daily as needed (skin bumps). 04/15/19   [name], FNP  nystatin (NYSTATIN) powder Apply 1 application topically 4 (four) times daily as needed. 08/03/20   [name], FNP  ondansetron (ZOFRAN ODT) 4 MG disintegrating tablet Take 1 tablet (4 mg total) by mouth every 6 (six) hours as needed. Patient not taking: Reported on 08/03/2020 06/08/20   [name], FNP  oxyCODONE-acetaminophen (PERCOCET/ROXICET) 5-325 MG tablet Take 2 tablets by mouth every 4 (four) hours as needed for severe pain. Patient taking differently: Take 1 tablet by mouth every 4 (four) hours as needed for severe pain.  05/08/20   Ward, Delice Bison, DO  sevelamer carbonate (RENVELA) 800 MG tablet Take 3,200 mg by mouth 3 (three) times daily with meals. Take 4 tablets with each meal 10/20/19   [provider]  temazepam (RESTORIL) 15 MG capsule Take 1 capsule (15 mg total) by mouth at bedtime as needed for sleep. Patient not taking: Reported on 08/03/2020 05/04/20   [name], FNP   vitamin E (VITAMIN E) 200 UNIT capsule Take 200 Units by mouth daily.    [provider]    Allergies    Carnosine, Gadolinium derivatives, Iohexol, Iodine, and Naltrexone  Review of Systems   Review of Systems  Respiratory: Positive for cough and shortness of breath.   Cardiovascular: Negative for chest pain and leg swelling.  All other systems reviewed and are negative.   Physical Exam Updated Vital Signs BP (!) 163/74   Pulse 84   Temp 98.5 F (36.9 C) (Temporal)   Resp 14   Ht 5\' 2"  (1.575 m)   Wt 90.7 kg   SpO2 100%   BMI 36.58 kg/m   Physical Exam Vitals and nursing note reviewed.  Constitutional:      General: She is not in acute distress.    Appearance: Normal appearance. She is well-developed.  HENT:     Head: Normocephalic and atraumatic.     Right Ear: Hearing normal.     Left Ear: Hearing normal.     Nose: Nose normal.  Eyes:     Conjunctiva/sclera: Conjunctivae normal.     Pupils: Pupils are equal, round, and reactive to light.  Cardiovascular:     Rate and Rhythm: Regular rhythm.     Heart sounds: S1 normal and S2 normal. No murmur heard.  No friction rub. No gallop.   Pulmonary:     Effort: Tachypnea and accessory muscle usage present.     Breath sounds: Decreased air movement present. Examination of the right-lower field reveals decreased breath sounds. Decreased breath sounds present.  Chest:     Chest wall: No tenderness.  Abdominal:     General: Bowel sounds are normal.     Palpations: Abdomen is soft.     Tenderness: There is no abdominal tenderness. There is no guarding or rebound. Negative signs include Murphy's sign and McBurney's sign.     Hernia: No hernia is present.  Musculoskeletal:        General: Normal range of motion.     Cervical back: Normal  range of motion and neck supple.  Skin:    General: Skin is warm and dry.     Findings: No rash.  Neurological:     Mental Status: She is alert and oriented to person, place,  and time.     GCS: GCS eye subscore is 4. GCS verbal subscore is 5. GCS motor subscore is 6.     Cranial Nerves: No cranial nerve deficit.     Sensory: No sensory deficit.     Coordination: Coordination normal.  Psychiatric:        Speech: Speech normal.        Behavior: Behavior normal.        Thought Content: Thought content normal.     ED Results / Procedures / Treatments   Labs (all labs ordered are listed, but only abnormal results are displayed) Labs Reviewed  CBC WITH DIFFERENTIAL/PLATELET - Abnormal; Notable for the following components:      Result Value   WBC 12.8 (*)    RBC 3.85 (*)    Hemoglobin 11.1 (*)    HCT 35.7 (*)    RDW 15.6 (*)    Neutro Abs 8.9 (*)    All other components within normal limits  RESPIRATORY PANEL BY RT PCR (FLU A&B, COVID)  LACTIC ACID, PLASMA  BASIC METABOLIC PANEL  BRAIN NATRIURETIC PEPTIDE  I-STAT CHEM 8, ED  TROPONIN I (HIGH SENSITIVITY)    EKG EKG Interpretation  Date/Time:  Monday October 30 2020 04:00:27 EDT Ventricular Rate:  52 PR Interval:    QRS Duration: 159 QT Interval:  519 QTC Calculation: 483 R Axis:   -90 Text Interpretation: Sinus rhythm Atrial premature complexes in couplets Short PR interval RBBB and LAFB Confirmed by Orpah Greek ([id number]) on 10/30/2020 4:22:49 AM   Radiology DG Chest Port 1 View  Result Date: 10/30/2020 CLINICAL DATA:  Shortness of breath EXAM: PORTABLE CHEST 1 VIEW COMPARISON:  09/28/2020 FINDINGS: Cardiopericardial enlargement and aortic tortuosity. Dialysis catheter with tip at the upper cavoatrial junction. Extensive artifact from EKG leads. Diffuse vascular prominence. No Kerley lines, effusion, or pneumothorax. IMPRESSION: Cardiomegaly and vascular congestion. Electronically Signed   By: Monte Fantasia M.D.   On: 10/30/2020 04:23    Procedures Procedures (including critical care time)  Medications Ordered in ED Medications  calcium chloride 10 % injection (has no  administration in time range)  calcium gluconate inj 10% (1 g) URGENT USE ONLY! ( Intravenous Given 10/30/20 0456)  calcium chloride 10 % injection (1,000 mg  Given 10/30/20 0449)  insulin aspart (novoLOG) injection 5 Units (5 Units Intravenous Given 10/30/20 0508)  dextrose 50 % solution 25 mL (25 mLs Intravenous Given 10/30/20 0503)  calcium gluconate inj 10% (1 g) URGENT USE ONLY! (1 g Intravenous Given 10/30/20 6010)    ED Course  I have reviewed the triage vital signs and the nursing notes.  Pertinent labs & imaging results that were available during my care of the patient were reviewed by me and considered in my medical decision making (see chart for details).    MDM Rules/Calculators/A&P                          Patient presents to the emergency department for evaluation of difficulty breathing.  Patient reports that she started having shortness of breath 2 days ago but symptoms worsened tonight.  Patient experiencing generalized tightness in the chest but no chest pain.  She does report a dry  nonproductive cough.  She has not had a fever.  She has been fully vaccinated for Covid.  Patient with decreased breath sounds bilaterally.  No significant leg edema.  She is known to have a history of COPD and CHF.  She was placed on supplemental oxygen at arrival.  Chest x-ray shows vascular congestion and cardiomegaly but no significant pulmonary edema.  EKG on arrival reveals bradycardia with right bundle branch block pattern.  Morphology of QRS is similar to previous but rate is slower.  Labs were drawn and while awaiting results patient became tachycardic with a very wide complex morphology.  Heart rate in the 115-125 range.  Based on her history of end-stage renal disease on dialysis and the morphology of her rhythm, hyperkalemia was suspected.  Patient administered calcium gluconate with narrowing and slowing of her rhythm back to her presentation rhythm.  An i-STAT Chem-8 was obtained which  shows potassium greater than 8.5.  Patient administered continuous albuterol as well as insulin with dextrose to further treat hyperkalemia.  She did start having widening of her QRS again, was given additional calcium and arrangements made for emergent dialysis.  CRITICAL CARE Performed by: Orpah Greek   Total critical care time: 35 minutes  Critical care time was exclusive of separately billable procedures and treating other patients.  Critical care was necessary to treat or prevent imminent or life-threatening deterioration.  Critical care was time spent personally by me on the following activities: development of treatment plan with patient and/or surrogate as well as nursing, discussions with consultants, evaluation of patient's response to treatment, examination of patient, obtaining history from patient or surrogate, ordering and performing treatments and interventions, ordering and review of laboratory studies, ordering and review of radiographic studies, pulse oximetry and re-evaluation of patient's condition.  Final Clinical Impression(s) / ED Diagnoses Final diagnoses:  Hyperkalemia    Rx / DC Orders ED Discharge Orders    None       Jalee Saine, Gwenyth Allegra, MD 10/30/20 [phone]

## 2020-10-31 DIAGNOSIS — J449 Chronic obstructive pulmonary disease, unspecified: Secondary | ICD-10-CM | POA: Diagnosis present

## 2020-10-31 DIAGNOSIS — R001 Bradycardia, unspecified: Secondary | ICD-10-CM | POA: Diagnosis present

## 2020-10-31 DIAGNOSIS — I4510 Unspecified right bundle-branch block: Secondary | ICD-10-CM | POA: Diagnosis present

## 2020-10-31 DIAGNOSIS — R2689 Other abnormalities of gait and mobility: Secondary | ICD-10-CM | POA: Diagnosis present

## 2020-10-31 DIAGNOSIS — E8889 Other specified metabolic disorders: Secondary | ICD-10-CM | POA: Diagnosis present

## 2020-10-31 DIAGNOSIS — J9601 Acute respiratory failure with hypoxia: Secondary | ICD-10-CM | POA: Diagnosis present

## 2020-10-31 DIAGNOSIS — D631 Anemia in chronic kidney disease: Secondary | ICD-10-CM | POA: Diagnosis present

## 2020-10-31 DIAGNOSIS — G4733 Obstructive sleep apnea (adult) (pediatric): Secondary | ICD-10-CM | POA: Diagnosis not present

## 2020-10-31 DIAGNOSIS — N186 End stage renal disease: Secondary | ICD-10-CM | POA: Diagnosis not present

## 2020-10-31 DIAGNOSIS — Z992 Dependence on renal dialysis: Secondary | ICD-10-CM | POA: Diagnosis not present

## 2020-10-31 DIAGNOSIS — F32A Depression, unspecified: Secondary | ICD-10-CM | POA: Diagnosis present

## 2020-10-31 DIAGNOSIS — E669 Obesity, unspecified: Secondary | ICD-10-CM | POA: Diagnosis present

## 2020-10-31 DIAGNOSIS — I132 Hypertensive heart and chronic kidney disease with heart failure and with stage 5 chronic kidney disease, or end stage renal disease: Secondary | ICD-10-CM | POA: Diagnosis not present

## 2020-10-31 DIAGNOSIS — Z85038 Personal history of other malignant neoplasm of large intestine: Secondary | ICD-10-CM | POA: Diagnosis not present

## 2020-10-31 DIAGNOSIS — W19XXXA Unspecified fall, initial encounter: Secondary | ICD-10-CM | POA: Diagnosis present

## 2020-10-31 DIAGNOSIS — I21A1 Myocardial infarction type 2: Secondary | ICD-10-CM | POA: Diagnosis present

## 2020-10-31 DIAGNOSIS — I2699 Other pulmonary embolism without acute cor pulmonale: Secondary | ICD-10-CM | POA: Diagnosis not present

## 2020-10-31 DIAGNOSIS — E8770 Fluid overload, unspecified: Secondary | ICD-10-CM | POA: Diagnosis not present

## 2020-10-31 DIAGNOSIS — F419 Anxiety disorder, unspecified: Secondary | ICD-10-CM | POA: Diagnosis present

## 2020-10-31 DIAGNOSIS — G2581 Restless legs syndrome: Secondary | ICD-10-CM | POA: Diagnosis present

## 2020-10-31 DIAGNOSIS — I509 Heart failure, unspecified: Secondary | ICD-10-CM | POA: Diagnosis not present

## 2020-10-31 DIAGNOSIS — F17210 Nicotine dependence, cigarettes, uncomplicated: Secondary | ICD-10-CM | POA: Diagnosis present

## 2020-10-31 DIAGNOSIS — E875 Hyperkalemia: Secondary | ICD-10-CM | POA: Diagnosis not present

## 2020-10-31 DIAGNOSIS — Z20822 Contact with and (suspected) exposure to covid-19: Secondary | ICD-10-CM | POA: Diagnosis present

## 2020-10-31 DIAGNOSIS — N2581 Secondary hyperparathyroidism of renal origin: Secondary | ICD-10-CM | POA: Diagnosis present

## 2020-10-31 DIAGNOSIS — I2510 Atherosclerotic heart disease of native coronary artery without angina pectoris: Secondary | ICD-10-CM | POA: Diagnosis not present

## 2020-10-31 DIAGNOSIS — I5032 Chronic diastolic (congestive) heart failure: Secondary | ICD-10-CM | POA: Diagnosis present

## 2020-10-31 DIAGNOSIS — I739 Peripheral vascular disease, unspecified: Secondary | ICD-10-CM | POA: Diagnosis present

## 2020-10-31 LAB — CBC
HCT: 33 % — ABNORMAL LOW (ref 36.0–46.0)
Hemoglobin: 10.2 g/dL — ABNORMAL LOW (ref 12.0–15.0)
MCH: 28.5 pg (ref 26.0–34.0)
MCHC: 30.9 g/dL (ref 30.0–36.0)
MCV: 92.2 fL (ref 80.0–100.0)
Platelets: 148 10*3/uL — ABNORMAL LOW (ref 150–400)
RBC: 3.58 MIL/uL — ABNORMAL LOW (ref 3.87–5.11)
RDW: 15.6 % — ABNORMAL HIGH (ref 11.5–15.5)
WBC: 11.4 10*3/uL — ABNORMAL HIGH (ref 4.0–10.5)
nRBC: 0 % (ref 0.0–0.2)

## 2020-10-31 LAB — RENAL FUNCTION PANEL
Albumin: 2.9 g/dL — ABNORMAL LOW (ref 3.5–5.0)
Anion gap: 15 (ref 5–15)
BUN: 39 mg/dL — ABNORMAL HIGH (ref 8–23)
CO2: 28 mmol/L (ref 22–32)
Calcium: 8.9 mg/dL (ref 8.9–10.3)
Chloride: 93 mmol/L — ABNORMAL LOW (ref 98–111)
Creatinine, Ser: 8.04 mg/dL — ABNORMAL HIGH (ref 0.44–1.00)
GFR, Estimated: 5 mL/min — ABNORMAL LOW (ref >60)
Glucose, Bld: 100 mg/dL — ABNORMAL HIGH (ref 70–99)
Phosphorus: 9.2 mg/dL — ABNORMAL HIGH (ref 2.5–4.6)
Potassium: 5.2 mmol/L — ABNORMAL HIGH (ref 3.5–5.1)
Sodium: 136 mmol/L (ref 135–145)

## 2020-10-31 LAB — TROPONIN I (HIGH SENSITIVITY)
Troponin I (High Sensitivity): 477 ng/L (ref <18)
Troponin I (High Sensitivity): 402 ng/L (ref <18)

## 2020-10-31 MED ORDER — SALINE SPRAY 0.65 % NA SOLN
1.0000 | NASAL | Status: DC | PRN
  Administered 2020-10-31: 1 via NASAL
  Filled 2020-10-31: qty 44

## 2020-10-31 NOTE — Progress Notes (Signed)
[facility] Progress Note  Subjective: seen in room, RA sat in bed 93- 95%  Vitals:   10/30/20 2320 10/31/20 0350 10/31/20 0730 10/31/20 1118  BP: (!) 104/58 121/76 132/81 116/80  Pulse: 97 85 89 86  Resp: 20 16 14 14   Temp: 99 F (37.2 C) 98.7 F (37.1 C) 98.4 F (36.9 C) 98 F (36.7 C)  TempSrc: Oral Oral Oral Oral  SpO2: 100% 99% 96% 94%  Weight:  89.5 kg    Height:        Exam:  alert, nad   no jvd  Chest cta bilat  Cor reg no RG  Abd soft ntnd no ascites   Ext mild LE edema   Alert, NF, ox3   IJ TDC in place     OP HD: [place] MWF  3.5h  90 kg 2/2 bath  P2 TDC  Hep 6000 Calcitriol 2.68mcg PO q HD Mircera 75 cg IVP q 2 weeks- last dose 78mcg on 10/18/20  Assessment/Plan: 1.  Hyperkalemia: Presented to ED with K+ > 7.5, down to 5.2 today after HD yest.  2.  ESRD:  Usual MWF HD. HD tomorrow. Pt refused HD today if pulling fluid.  3.  Hypertension/volume: CXR vasc congestion, slightly under dry wt after 1.4 L off yest (3 L goal). May have lost body wt.  Pt doesn't want to pull fluid w/ HD if under dry wt because "I will cramp". Will reassess again in am.  4.  Anemia: Hgb 11.1. No indication for ESA at this time.  5.  Metabolic bone disease: Calcium 9.8, will hold VDRA for now and check calcium and albumin in the AM. Check phos with RFP. Current binder is renvela.  6. Nutrition:  Discussed the importance of following a renal diet (low potassium, low phosphorus, and 1.2L fluid restriction). Check Alb with AM labs.    Rob Tamme Mozingo 10/31/2020, 2:43 PM   Recent Labs  Lab 10/30/20 0407 10/30/20 0603 10/30/20 1735 10/31/20 0027  K >7.5*   < > 5.0 5.2*  BUN 69*  --   --  39*  CREATININE 12.21*  --   --  8.04*  CALCIUM 9.8  --   --  8.9  PHOS  --   --   --  9.2*  HGB 11.1*  --   --  10.2*   < > = values in this interval not displayed.   Inpatient medications: . amLODipine  5 mg Oral Daily  . apixaban  2.5 mg Oral BID  . carvedilol  12.5 mg Oral  Once per day on Mon Wed Fri  . carvedilol  12.5 mg Oral 2 times per day on Sun Tue Thu Sat  . Chlorhexidine Gluconate Cloth  6 each Topical Q0600  . sevelamer carbonate  3,200 mg Oral TID WC    acetaminophen **OR** acetaminophen, oxyCODONE-acetaminophen

## 2020-10-31 NOTE — Plan of Care (Signed)
  Problem: Education: Goal: Knowledge of General Education information will improve Description: Including pain rating scale, medication(s)/side effects and non-pharmacologic comfort measures Outcome: Progressing   Problem: Health Behavior/Discharge Planning: Goal: Ability to manage health-related needs will improve Outcome: Progressing   Problem: Clinical Measurements: Goal: Ability to maintain clinical measurements within normal limits will improve Outcome: Progressing   Problem: Clinical Measurements: Goal: Diagnostic test results will improve Outcome: Progressing   Problem: Activity: Goal: Risk for activity intolerance will decrease Outcome: Progressing   Problem: Nutrition: Goal: Adequate nutrition will be maintained Outcome: Progressing   Problem: Pain Managment: Goal: General experience of comfort will improve Outcome: Progressing   Problem: Safety: Goal: Ability to remain free from injury will improve Outcome: Progressing

## 2020-10-31 NOTE — Progress Notes (Addendum)
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  Patient is complaining of bilateral lower extremity weakness and gait issues.  She fell yesterday however denies any head or back trauma.  She denies any urinary or bowel incontinence.  In ED: Patient was placed on BiPAP since patient was short of breath and chest x-ray shows congestion.  Labs showed potassium of more than 7.5 with EKG: Peaked T waves and broad QRS complex.  Initial troponin: 31, hemoglobin: 11.1, afebrile with leukocytosis of 12.8.  COVID-19 negative.  Patient was given IV calcium gluconate with D50 with 5 units of insulin and albuterol in ED.  Nephrology was consulted and patient was taken to urgent hemodialysis.  Assessment & Plan:   Severe hyperkalemia: -Likely combination of short and dialysis treatments and increased dietary potassium intake -In the patient with known history of ESRD. -Patient was given IV calcium gluconate, D50 with 5 units of insulin and albuterol in ED. -Nephrology consulted and patient was taken to urgent hemodialysis on 11/2.. -Reviewed EKG. -Potassium improved to 5.2 this morning. -Appreciate nephrology help-patient refused hemodialysis today.  Likely will go for hemodialysis tomorrow AM.  Acute hypoxemic respiratory failure in the setting of fluid overload.  Initially requiring BiPAP now on nasal cannula.  BNP is elevated: 406.[name]  Chest x-ray shows vascular congestion and cardiomegaly.  No pulmonary edema.  -We will try to wean off of oxygen as tolerated.  Bilateral lower extremity weakness: Improving. -Likely secondary to electrolyte derangement -We will consult PT/OT.  Elevated troponin: -Initial  troponin 31, trended up to 138--> 458--> 477--> 402.  Likely demand ischemia in the setting of volume overload secondary to ESRD. -Reviewed EKG.  -Discussed with cardiology Dr. Einar Gip on 11/2-  Her elevated troponin is likely secondary to type II MI due to underlying CHF.  No cardiac intervention indicated at this time.  Recommended outpatient follow-up.  Hypertension: Elevated upon arrival now improving. -Continue home meds of Coreg and amlodipine and continue to monitor blood pressure closely  Anemia of chronic disease: H&H is stable.  Continue to monitor  History of PE/DVT: Continue Eliquis  DVT prophylaxis: Heparin/SCD  code Status: Full code Family Communication: None present at bedside.  Plan of care discussed with patient in length and she verbalized understanding and agreed with it. Disposition Plan: Likely home on 11/01/2020  Consultants:   Nephrology  Cardiology  Procedures:   Dialysis  Antimicrobials:  None Status is: Inpatient   Dispo: The patient is from: Home              Anticipated d/c is to: Home              Anticipated d/c date is: 11/01/2020              Patient currently not medically stable for the discharge    Subjective: Patient seen and examined.  Tells me that she feels much better.  Her bilateral leg swelling has improved significantly.  Reports some chest pain especially with certain movements which goes back to her right shoulder and back.  She denies nausea, vomiting, abdominal pain, palpitation.  Objective: Vitals:   10/30/20 2320 10/31/20 0350 10/31/20 0730 10/31/20 1118  BP: (!) 104/58 121/76 132/81 116/80  Pulse: 97 85 89 86  Resp: 20 16 14 14   Temp: 99 F (37.2 C) 98.7 F (37.1 C) 98.4 F (36.9 C) 98 F (36.7 C)  TempSrc: Oral Oral Oral Oral  SpO2: 100% 99% 96% 94%  Weight:  89.5 kg    Height:        Intake/Output Summary (Last 24 hours) at 10/31/2020 1347 Last data filed at 10/31/2020 1300 Gross per 24 hour  Intake 720 ml   Output --  Net 720 ml   Filed Weights   10/30/20 0425 10/31/20 0350  Weight: 90.7 kg 89.5 kg    Examination: General exam: Appears calm and comfortable, obese, on 2 L oxygen via nasal cannula, communicating well Respiratory system: Clear to auscultation. Respiratory effort normal. Cardiovascular system: S1 & S2 heard, RRR. No JVD, murmurs, rubs, gallops or clicks. No pedal edema. Gastrointestinal system: Abdomen is nondistended, soft and nontender. No organomegaly or masses felt. Normal bowel sounds heard. Central nervous system: Alert and oriented. No focal neurological deficits. Extremities: Symmetric 5 x 5 power. Skin: No rashes, lesions or ulcers. Psychiatry: Judgement and insight appear normal. Mood & affect appropriate.    Data Reviewed: I have personally reviewed following labs and imaging studies  CBC: Recent Labs  Lab 10/30/20 0407 10/31/20 0027  WBC 12.8* 11.4*  NEUTROABS 8.9*  --   HGB 11.1* 10.2*  HCT 35.7* 33.0*  MCV 92.7 92.2  PLT 202 250*   Basic Metabolic Panel: Recent Labs  Lab 10/30/20 0407 10/30/20 0603 10/30/20 1735 10/31/20 0027  NA 131*  --   --  136  K >7.5* 5.3* 5.0 5.2*  CL 98  --   --  93*  CO2 17*  --   --  28  GLUCOSE 98  --   --  100*  BUN 69*  --   --  39*  CREATININE 12.21*  --   --  8.04*  CALCIUM 9.8  --   --  8.9  MG  --   --  1.9  --   PHOS  --   --   --  9.2*   GFR: Estimated Creatinine Clearance: 7.3 mL/min (A) (by C-G formula based on SCr of 8.04 mg/dL (H)). Liver Function Tests: Recent Labs  Lab 10/31/20 0027  ALBUMIN 2.9*   No results for input(s): LIPASE, AMYLASE in the last 168 hours. No results for input(s): AMMONIA in the last 168 hours. Coagulation Profile: No results for input(s): INR, PROTIME in the last 168 hours. Cardiac Enzymes: No results for input(s): CKTOTAL, CKMB, CKMBINDEX, TROPONINI in the last 168 hours. BNP (last 3 results) No results for input(s): PROBNP in the last 8760 hours. HbA1C: No  results for input(s): HGBA1C in the last 72 hours. CBG: No results for input(s): GLUCAP in the last 168 hours. Lipid Profile: No results for input(s): CHOL, HDL, LDLCALC, TRIG, CHOLHDL, LDLDIRECT in the last 72 hours. Thyroid Function Tests: No results for input(s): TSH, T4TOTAL, FREET4, T3FREE, THYROIDAB in the last 72 hours. Anemia Panel: No results for input(s): VITAMINB12, FOLATE, FERRITIN, TIBC, IRON, RETICCTPCT in the last 72 hours. Sepsis Labs: Recent Labs  Lab 10/30/20 0407  LATICACIDVEN 1.7    Recent Results (from the past 240 hour(s))  Respiratory Panel by RT PCR (Flu A&B, Covid) - Nasopharyngeal Swab     Status: None   Collection Time: 10/30/20  4:49 AM   Specimen: Nasopharyngeal Swab  Result Value Ref Range Status   SARS Coronavirus 2 by RT PCR  NEGATIVE NEGATIVE Final    Comment: (NOTE) SARS-CoV-2 target nucleic acids are NOT DETECTED.  The SARS-CoV-2 RNA is generally detectable in upper respiratoy specimens during the acute phase of infection. The lowest concentration of SARS-CoV-2 viral copies this assay can detect is 131 copies/mL. A negative result does not preclude SARS-Cov-2 infection and should not be used as the sole basis for treatment or other patient management decisions. A negative result may occur with  improper specimen collection/handling, submission of specimen other than nasopharyngeal swab, presence of viral mutation(s) within the areas targeted by this assay, and inadequate number of viral copies (<131 copies/mL). A negative result must be combined with clinical observations, patient history, and epidemiological information. The expected result is Negative.  Fact Sheet for Patients:  PinkCheek.be  Fact Sheet for Healthcare Providers:  GravelBags.it  This test is no t yet approved or cleared by the [place] FDA and  has been authorized for detection and/or diagnosis of SARS-CoV-2  by FDA under an Emergency Use Authorization (EUA). This EUA will remain  in effect (meaning this test can be used) for the duration of the COVID-19 declaration under Section 564(b)(1) of the Act, 21 U.S.C. section 360bbb-3(b)(1), unless the authorization is terminated or revoked sooner.     Influenza A by PCR NEGATIVE NEGATIVE Final   Influenza B by PCR NEGATIVE NEGATIVE Final    Comment: (NOTE) The Xpert Xpress SARS-CoV-2/FLU/RSV assay is intended as an aid in  the diagnosis of influenza from Nasopharyngeal swab specimens and  should not be used as a sole basis for treatment. Nasal washings and  aspirates are unacceptable for Xpert Xpress SARS-CoV-2/FLU/RSV  testing.  Fact Sheet for Patients: PinkCheek.be  Fact Sheet for Healthcare Providers: GravelBags.it  This test is not yet approved or cleared by the [place] FDA and  has been authorized for detection and/or diagnosis of SARS-CoV-2 by  FDA under an Emergency Use Authorization (EUA). This EUA will remain  in effect (meaning this test can be used) for the duration of the  Covid-19 declaration under Section 564(b)(1) of the Act, 21  U.S.C. section 360bbb-3(b)(1), unless the authorization is  terminated or revoked. Performed at [facility], [place] [address], [place], [place] [address]   MRSA PCR Screening     Status: None   Collection Time: 10/30/20  2:30 PM   Specimen: Nasal Mucosa; Nasopharyngeal  Result Value Ref Range Status   MRSA by PCR NEGATIVE NEGATIVE Final    Comment:        The GeneXpert MRSA Assay (FDA approved for NASAL specimens only), is one component of a comprehensive MRSA colonization surveillance program. It is not intended to diagnose MRSA infection nor to guide or monitor treatment for MRSA infections. Performed at [facility], [place] [address] [place], [place] [address]       Radiology Studies: DG Chest Port 1  View  Result Date: 10/30/2020 CLINICAL DATA:  Shortness of breath EXAM: PORTABLE CHEST 1 VIEW COMPARISON:  09/28/2020 FINDINGS: Cardiopericardial enlargement and aortic tortuosity. Dialysis catheter with tip at the upper cavoatrial junction. Extensive artifact from EKG leads. Diffuse vascular prominence. No Kerley lines, effusion, or pneumothorax. IMPRESSION: Cardiomegaly and vascular congestion. Electronically Signed   By: Monte Fantasia M.D.   On: 10/30/2020 04:23    Scheduled Meds: . amLODipine  5 mg Oral Daily  . apixaban  2.5 mg Oral BID  . carvedilol  12.5 mg Oral Once per day on Mon Wed Fri  . carvedilol  12.5 mg Oral 2 times per day on Sun Tue Thu Sat  . Chlorhexidine Gluconate Cloth  6 each Topical Q0600  . sevelamer carbonate  3,200 mg Oral TID WC   Continuous Infusions:   LOS: 0 days   Time spent: 40 minutes  Jalyric Kaestner Loann Quill, MD [facility]  If 7PM-7AM, please contact night-coverage [email or web address] 10/31/2020, 1:47 PM

## 2020-10-31 NOTE — Progress Notes (Signed)
RT visited pt to discuss placing bipap for the night.  Pt on RA with stable VS.  Pt stated she did not want to wear bipap d/t a nose bleed and how uncomfortable it was to her.  Pt is aware she can ask for device again if needed.  RT removed device from pt room d/t it was a Servo model and not allowed on this particular floor.  If pt wants to wear the bipap, we will replace with a V60 model.  RT will continue to monitor.

## 2020-10-31 NOTE — Evaluation (Signed)
Physical Therapy Evaluation Patient Details Name: Whitney Walker MRN: [id number] DOB: 1956/09/13 Today's Date: 10/31/2020   History of Present Illness  Patient is a 64 y/o female who presents with cough, SOB, weakness. Found to have hyperglycemia with respiratory failure. PMH includes ESRD on HD MWF, HTN, COPD not on home 02, DVT/PE on anticoagulation.  Clinical Impression  Patient presents with generalized weakness, pain, impaired balance, dyspnea on exertion and impaired mobility s/p above. Pt lives alone and reports some difficulty with ADls/IADLs esp washing back in the shower. Uses RW for ambulation due to balance deficits and vertigo. Today, pt requires Min A for bed mobility, supervision for transfers and Min guard assist for balance/safety with use of RW for support. VSS on RA. Encouraged increasing activity while in the hospital and walking to the bathroom daily. Will follow acutely to maximize independence and mobility prior to return home.    Follow Up Recommendations No PT follow up;Supervision - Intermittent    Equipment Recommendations  None recommended by PT    Recommendations for Other Services       Precautions / Restrictions Precautions Precautions: Fall;Other (comment) Precaution Comments: watch 02 Restrictions Weight Bearing Restrictions: No      Mobility  Bed Mobility Overal bed mobility: Needs Assistance Bed Mobility: Rolling;Sidelying to Sit Rolling: Supervision Sidelying to sit: Min assist;HOB elevated       General bed mobility comments: Min A to elevate trunk due to pain pushing through RUE to get upright.    Transfers Overall transfer level: Needs assistance Equipment used: Rolling walker (2 wheeled) Transfers: Sit to/from Stand Sit to Stand: Supervision         General transfer comment: Supervision for safety. Stood from [employer].  Ambulation/Gait Ambulation/Gait assistance: Min guard Gait Distance (Feet): 150 Feet Assistive device:  Rolling walker (2 wheeled) Gait Pattern/deviations: Step-through pattern;Decreased stride length   Gait velocity interpretation: 1.31 - 2.62 ft/sec, indicative of limited community ambulator General Gait Details: Slow, mostly steady gait wtih RW for support. 2/4 DOE. Sp02 remained >92% on RA.  Stairs            Wheelchair Mobility    Modified Rankin (Stroke Patients Only)       Balance Overall balance assessment: History of Falls;Needs assistance Sitting-balance support: Feet supported;No upper extremity supported Sitting balance-Leahy Scale: Good     Standing balance support: During functional activity Standing balance-Leahy Scale: Poor Standing balance comment: Requires UE support for safety                             Pertinent Vitals/Pain Pain Assessment: 0-10 Pain Score: 10-Worst pain ever Pain Location: under right shoulder blade Pain Descriptors / Indicators: Aching;Sore Pain Intervention(s): Monitored during session;Repositioned;Premedicated before session    Home Living Family/patient expects to be discharged to:: Private residence Living Arrangements: Alone Available Help at Discharge: Family Type of Home: Apartment Home Access: Stairs to enter Entrance Stairs-Rails: Psychiatric nurse of Steps: 1 step up, 4 out back Home Layout: One level Home Equipment: Clinical cytogeneticist - 2 wheels      Prior Function Level of Independence: Independent with assistive device(s)         Comments: uses RW for ambulation. Does own ADLs. Does own cooking. About 3 falls in last 6 months.     Hand Dominance   Dominant Hand: Right    Extremity/Trunk Assessment   Upper Extremity Assessment Upper Extremity Assessment: Defer to OT evaluation  Lower Extremity Assessment Lower Extremity Assessment: Generalized weakness (but functional)    Cervical / Trunk Assessment Cervical / Trunk Assessment: Normal  Communication    Communication: No difficulties  Cognition Arousal/Alertness: Awake/alert Behavior During Therapy: WFL for tasks assessed/performed Overall Cognitive Status: Within Functional Limits for tasks assessed                                 General Comments: for basic mobility tasks. Increased time to state date but stated correct month and the 1st.      General Comments General comments (skin integrity, edema, etc.): Having difficulty performing ADLs - washing bath, feet, getting in/out of bath tub- requesting aide to assist at home. Was denied previously.    Exercises     Assessment/Plan    PT Assessment Patient needs continued PT services  PT Problem List Decreased mobility;Decreased strength;Decreased balance;Decreased activity tolerance;Cardiopulmonary status limiting activity       PT Treatment Interventions Therapeutic activities;Gait training;Therapeutic exercise;Patient/family education;Balance training;Stair training;Functional mobility training    PT Goals (Current goals can be found in the Care Plan section)  Acute Rehab PT Goals Patient Stated Goal: to get better and go home PT Goal Formulation: With patient Time For Goal Achievement: 11/14/20 Potential to Achieve Goals: Good    Frequency Min 3X/week   Barriers to discharge Decreased caregiver support;Inaccessible home environment stairs, lives alone    Co-evaluation               AM-PAC PT "6 Clicks" Mobility  Outcome Measure Help needed turning from your back to your side while in a flat bed without using bedrails?: None Help needed moving from lying on your back to sitting on the side of a flat bed without using bedrails?: A Little Help needed moving to and from a bed to a chair (including a wheelchair)?: None Help needed standing up from a chair using your arms (e.g., wheelchair or bedside chair)?: None Help needed to walk in hospital room?: A Little Help needed climbing 3-5 steps with a  railing? : A Little 6 Click Score: 21    End of Session Equipment Utilized During Treatment: Gait belt Activity Tolerance: Patient tolerated treatment well Patient left: in bed;with call bell/phone within reach;with bed alarm set (sitting EOB) Nurse Communication: Mobility status PT Visit Diagnosis: Muscle weakness (generalized) (M62.81);Unsteadiness on feet (R26.81)    Time: 4268-3419 PT Time Calculation (min) (ACUTE ONLY): 19 min   Charges:   PT Evaluation $PT Eval Moderate Complexity: 1 Mod          Marisa Severin, PT, DPT Acute Rehabilitation Services Pager [phone] Office [phone]      Marguarite Arbour A Haroon Shatto 10/31/2020, 9:00 AM

## 2020-10-31 NOTE — Progress Notes (Addendum)
Occupational Therapy Evaluation  PTA, pt lives alone and is independent with mobility and self care. Pt states she has had more difficulty completing IADL tasks due to "not having the energy". Attempted to begin education regarding energy conservation. Pt overall set up with ADL tasks. VSS during session. Feel pt would benefit from [facility] however pt states she does not want [place] services. Will follow acutely to educate pt on energy conservation and strategies to reduce risk of falls.     10/31/20 1000  OT Visit Information  Last OT Received On 10/31/20  Assistance Needed +1  History of Present Illness Patient is a 64 y/o female who presents with cough, SOB, weakness. Found to have hyperglycemia with respiratory failure. PMH includes ESRD on HD MWF, HTN, COPD not on home 02, DVT/PE on anticoagulation.  Precautions  Precautions Fall;Other (comment)  Home Living  Family/patient expects to be discharged to: Private residence  Living Arrangements Alone  Available Help at Discharge Family  Type of [place] to enter  Entrance Stairs-Number of Steps 1 step up, 4 out back  [place] One level  Bathroom Shower/Tub Tub/shower unit;Curtain  [place] height  Bathroom Accessibility Yes  How Accessible Accessible via Paramedic - 2 wheels; rollator - still in a box  Additional Comments states shower seat does not fit in her tub  Prior Function  Level of Independence Independent with assistive device(s)  Comments States tasks such as taking out the trash; cooking/cleaning - gets very tired "don't have the energy that ai use to have"; has fallen 3 times in the past month - legs giving out; did manage to get up but " I don't know how"; does her own medicaiton management - uses a pill box; financial management; drives  Communication  Communication No difficulties  Pain Assessment  Pain Assessment  0-10  Pain Score 7  Pain Location under right shoulder blade  Pain Descriptors / Indicators Aching;Sore  Pain Intervention(s) Limited activity within patient's tolerance  Cognition  Arousal/Alertness Awake/alert  Behavior During Therapy WFL for tasks assessed/performed  Overall Cognitive Status Within Functional Limits for tasks assessed  General Comments most likely baseline  Upper Extremity Assessment  Upper Extremity Assessment Generalized weakness (Complains of pain under R shoulder blade; WFL)  Lower Extremity Assessment  Lower Extremity Assessment Defer to PT evaluation  Cervical / Trunk Assessment  Cervical / Trunk Assessment Normal  ADL  Overall ADL's  Needs assistance/impaired  General ADL Comments Pt reports she is up and walking in the room by herself and going to the bathroom. States IADL tasks are more difficult. Attempted to educate pt on energy conservaiton strategies. Pt overall set up for bathing/dressing. Will benefit from education regarding reducing risk of falls  Vision- History  Baseline Vision/History Wears glasses;Cataracts  Wears Glasses Reading only  Vision- Assessment  Additional Comments follow with an eye doctor; states vision is "more blurry"  Bed Mobility  Overal bed mobility Modified Independent  Transfers  Overall transfer level Modified independent  General transfer comment uses RW as needed at home  Balance  Overall balance assessment History of Falls  Sitting balance-Leahy Scale Good  Standing balance-Leahy Scale Fair  Exercises  Exercises Other exercises  Other Exercises  Other Exercises pursed lip breathing  OT - End of Session  Equipment Utilized During Treatment Gait belt  Activity Tolerance Patient tolerated treatment well  Patient left in bed;with call bell/phone within  reach;with bed alarm set  Nurse Communication Mobility status  OT Assessment  OT Recommendation/Assessment Patient needs continued OT Services  OT Visit Diagnosis  Unsteadiness on feet (R26.81);Pain;Muscle weakness (generalized) (M62.81);History of falling (Z91.81)  Pain - Right/Left Right  Pain - part of body Shoulder  OT Problem List Decreased strength;Impaired balance (sitting and/or standing);Decreased activity tolerance;Decreased safety awareness;Obesity;Pain;Cardiopulmonary status limiting activity;Decreased knowledge of use of DME or AE  OT Plan  OT Frequency (ACUTE ONLY) Min 2X/week  OT Treatment/Interventions (ACUTE ONLY) Self-care/ADL training;Therapeutic exercise;Energy conservation;DME and/or AE instruction;Therapeutic activities;Patient/family education;Balance training  AM-PAC OT "6 Clicks" Daily Activity Outcome Measure (Version 2)  Help from another person eating meals? 4  Help from another person taking care of personal grooming? 3  Help from another person toileting, which includes using toliet, bedpan, or urinal? 3  Help from another person bathing (including washing, rinsing, drying)? 3  Help from another person to put on and taking off regular upper body clothing? 3  Help from another person to put on and taking off regular lower body clothing? 3  6 Click Score 19  OT Recommendation  Follow Up Recommendations No OT follow up;Supervision - Intermittent (Pt does not want HH sevices)  OT Equipment None recommended by OT  Individuals Consulted  Consulted and Agree with Results and Recommendations Patient  Acute Rehab OT Goals  Patient Stated Goal to go home  OT Goal Formulation With patient  Time For Goal Achievement 11/14/20  Potential to Achieve Goals Good  OT Time Calculation  OT Start Time (ACUTE ONLY) 1021  OT Stop Time (ACUTE ONLY) 1041  OT Time Calculation (min) 20 min  OT General Charges  $OT Visit 1 Visit  OT Evaluation  $OT Eval Moderate Complexity 1 Mod  Written Expression  Dominant Hand Right  Maurie Boettcher, OT/L   Acute OT Clinical Specialist Acute Rehabilitation Services Pager [phone] Office  [phone]

## 2020-11-01 ENCOUNTER — Other Ambulatory Visit: Payer: Self-pay

## 2020-11-01 ENCOUNTER — Encounter (HOSPITAL_COMMUNITY): Payer: Self-pay | Admitting: Internal Medicine

## 2020-11-01 LAB — RENAL FUNCTION PANEL
Albumin: 2.9 g/dL — ABNORMAL LOW (ref 3.5–5.0)
Anion gap: 15 (ref 5–15)
BUN: 61 mg/dL — ABNORMAL HIGH (ref 8–23)
CO2: 25 mmol/L (ref 22–32)
Calcium: 9.1 mg/dL (ref 8.9–10.3)
Chloride: 95 mmol/L — ABNORMAL LOW (ref 98–111)
Creatinine, Ser: 10.69 mg/dL — ABNORMAL HIGH (ref 0.44–1.00)
GFR, Estimated: 4 mL/min — ABNORMAL LOW (ref >60)
Glucose, Bld: 99 mg/dL (ref 70–99)
Phosphorus: 10.1 mg/dL — ABNORMAL HIGH (ref 2.5–4.6)
Potassium: 6.3 mmol/L (ref 3.5–5.1)
Sodium: 135 mmol/L (ref 135–145)

## 2020-11-01 LAB — CBC
HCT: 31 % — ABNORMAL LOW (ref 36.0–46.0)
Hemoglobin: 9.8 g/dL — ABNORMAL LOW (ref 12.0–15.0)
MCH: 29.1 pg (ref 26.0–34.0)
MCHC: 31.6 g/dL (ref 30.0–36.0)
MCV: 92 fL (ref 80.0–100.0)
Platelets: 161 10*3/uL (ref 150–400)
RBC: 3.37 MIL/uL — ABNORMAL LOW (ref 3.87–5.11)
RDW: 15.1 % (ref 11.5–15.5)
WBC: 11.2 10*3/uL — ABNORMAL HIGH (ref 4.0–10.5)
nRBC: 0 % (ref 0.0–0.2)

## 2020-11-01 LAB — MAGNESIUM: Magnesium: 1.9 mg/dL (ref 1.7–2.4)

## 2020-11-01 MED ORDER — ALTEPLASE 2 MG IJ SOLR: 2.0000 mg | Freq: Once | INTRAMUSCULAR | Status: DC | PRN

## 2020-11-01 MED ORDER — LIDOCAINE-PRILOCAINE 2.5-2.5 % EX CREA: 1.0000 "application " | TOPICAL_CREAM | CUTANEOUS | Status: DC | PRN

## 2020-11-01 MED ORDER — HEPARIN SODIUM (PORCINE) 1000 UNIT/ML DIALYSIS: 1000.0000 [IU] | INTRAMUSCULAR | Status: DC | PRN

## 2020-11-01 MED ORDER — PENTAFLUOROPROP-TETRAFLUOROETH EX AERO: 1.0000 "application " | INHALATION_SPRAY | CUTANEOUS | Status: DC | PRN

## 2020-11-01 MED ORDER — HEPARIN SODIUM (PORCINE) 1000 UNIT/ML IJ SOLN
INTRAMUSCULAR | Status: AC
  Filled 2020-11-01: qty 6

## 2020-11-01 MED ORDER — SODIUM CHLORIDE 0.9 % IV SOLN: 100.0000 mL | INTRAVENOUS | Status: DC | PRN

## 2020-11-01 MED ORDER — HEPARIN SODIUM (PORCINE) 1000 UNIT/ML IJ SOLN
INTRAMUSCULAR | Status: AC
  Filled 2020-11-01: qty 3

## 2020-11-01 MED ORDER — LIDOCAINE HCL (PF) 1 % IJ SOLN: 5.0000 mL | INTRAMUSCULAR | Status: DC | PRN

## 2020-11-01 MED ORDER — HEPARIN SODIUM (PORCINE) 1000 UNIT/ML DIALYSIS
6000.0000 [IU] | Freq: Once | INTRAMUSCULAR | Status: AC
  Administered 2020-11-01: 6000 [IU] via INTRAVENOUS_CENTRAL

## 2020-11-01 NOTE — Plan of Care (Signed)

## 2020-11-01 NOTE — Progress Notes (Signed)
[facility] Progress Note  Subjective: seen in room, on RA sats mid to high 90's this am.   Vitals:   10/31/20 1923 10/31/20 2056 10/31/20 2331 11/01/20 0359  BP: (!) 124/54 122/75 (!) 125/59 139/73  Pulse: 84 99 82 81  Resp: 18  20 20   Temp: 97.8 F (36.6 C)  97.7 F (36.5 C) 97.6 F (36.4 C)  TempSrc: Oral  Oral Oral  SpO2: 94%  92% 99%  Weight:    89.9 kg  Height:        Exam:  alert, nad   no jvd  Chest cta bilat  Cor reg no RG  Abd soft ntnd no ascites   Ext mild LE edema   Alert, NF, ox3   IJ TDC in place     OP HD: [facility] MWF  3.5h  90 kg 2/2 bath  P2 TDC  Hep 6000 Calcitriol 2.5mcg PO q HD Mircera 75 cg IVP q 2 weeks- last dose 16mcg on 10/18/20  Assessment/Plan: 1.  Hyperkalemia: Presented to ED with K+ > 7.5, down to 5.2 yest. HD this am. Talked about problem foods.  2.  ESRD:  Usual MWF HD. HD this am. Should be ok for dc after HD. 3.  Hypertension/volume: CXR vasc congestion on admit, only got 1.4 kg off initial HD. Resp failure/ bipap on presentation I don't think was from fluid overload, more likely anxiety (has hx of same per pt) and/or hyperkalemia w/ resp musc paralysis. Will challenge edw slightly 0.5 kg this am.  4.  Anemia: Hgb 11.1. No indication for ESA at this time.  5.  Metabolic bone disease: Calcium 9.8, will hold VDRA for now and check calcium and albumin in the AM. Check phos with RFP. Current binder is renvela.  6. Nutrition:  Discussed the importance of following a renal diet (low potassium, low phosphorus, and 1.2L fluid restriction). Check Alb with AM labs.    Rob [name] 11/01/2020, 8:33 AM   Recent Labs  Lab 10/31/20 0027 11/01/20 0035  K 5.2* 6.3*  BUN 39* 61*  CREATININE 8.04* 10.69*  CALCIUM 8.9 9.1  PHOS 9.2* 10.1*  HGB 10.2* 9.8*   Inpatient medications: . amLODipine  5 mg Oral Daily  . apixaban  2.5 mg Oral BID  . carvedilol  12.5 mg Oral Once per day on Mon Wed Fri  . carvedilol  12.5 mg Oral 2  times per day on Sun Tue Thu Sat  . Chlorhexidine Gluconate Cloth  6 each Topical Q0600  . sevelamer carbonate  3,200 mg Oral TID WC    acetaminophen **OR** acetaminophen, oxyCODONE-acetaminophen, sodium chloride

## 2020-11-01 NOTE — Progress Notes (Signed)
PT Cancellation Note  Patient Details Name: Whitney Walker MRN: [id number] DOB: [birth year]   Cancelled Treatment:    Reason Eval/Treat Not Completed: (P) Patient at procedure or test/unavailable (Pt off unit for HD.  Will f/u per POC.)   [name] Eli Hose 11/01/2020, 9:57 AM  Erasmo Leventhal , PTA Acute Rehabilitation Services Pager [phone] Office [phone]

## 2020-11-01 NOTE — Plan of Care (Signed)
  Problem: Education: Goal: Knowledge of General Education information will improve Description: Including pain rating scale, medication(s)/side effects and non-pharmacologic comfort measures 11/01/2020 1502 by Shanon Ace, RN Outcome: Adequate for Discharge 11/01/2020 1502 by Shanon Ace, RN Outcome: Progressing 11/01/2020 0738 by Shanon Ace, RN Outcome: Progressing   Problem: Health Behavior/Discharge Planning: Goal: Ability to manage health-related needs will improve 11/01/2020 1502 by Shanon Ace, RN Outcome: Adequate for Discharge 11/01/2020 1502 by Shanon Ace, RN Outcome: Progressing 11/01/2020 0738 by Shanon Ace, RN Outcome: Progressing   Problem: Health Behavior/Discharge Planning: Goal: Ability to manage health-related needs will improve 11/01/2020 1502 by Shanon Ace, RN Outcome: Adequate for Discharge 11/01/2020 1502 by Shanon Ace, RN Outcome: Progressing 11/01/2020 0738 by Shanon Ace, RN Outcome: Progressing   Problem: Clinical Measurements: Goal: Ability to maintain clinical measurements within normal limits will improve 11/01/2020 1502 by Shanon Ace, RN Outcome: Adequate for Discharge 11/01/2020 1502 by Shanon Ace, RN Outcome: Progressing 11/01/2020 0738 by Shanon Ace, RN Outcome: Progressing Goal: Will remain free from infection 11/01/2020 1502 by Shanon Ace, RN Outcome: Adequate for Discharge 11/01/2020 1502 by Shanon Ace, RN Outcome: Progressing 11/01/2020 0738 by Shanon Ace, RN Outcome: Progressing Goal: Diagnostic test results will improve 11/01/2020 1502 by Shanon Ace, RN Outcome: Adequate for Discharge 11/01/2020 1502 by Shanon Ace, RN Outcome: Progressing 11/01/2020 0738 by Shanon Ace, RN Outcome: Progressing Goal: Respiratory complications will improve 11/01/2020 1502 by Shanon Ace, RN Outcome: Adequate for Discharge 11/01/2020 1502 by Shanon Ace, RN Outcome: Progressing 11/01/2020 0738 by Shanon Ace,  RN Outcome: Progressing Goal: Cardiovascular complication will be avoided 11/01/2020 1502 by Shanon Ace, RN Outcome: Adequate for Discharge 11/01/2020 1502 by Shanon Ace, RN Outcome: Progressing 11/01/2020 0738 by Shanon Ace, RN Outcome: Progressing   Problem: Activity: Goal: Risk for activity intolerance will decrease 11/01/2020 1502 by Shanon Ace, RN Outcome: Adequate for Discharge 11/01/2020 1502 by Shanon Ace, RN Outcome: Progressing 11/01/2020 0738 by Shanon Ace, RN Outcome: Progressing   Problem: Coping: Goal: Level of anxiety will decrease 11/01/2020 1502 by Shanon Ace, RN Outcome: Adequate for Discharge 11/01/2020 1502 by Shanon Ace, RN Outcome: Progressing 11/01/2020 0738 by Shanon Ace, RN Outcome: Progressing   Problem: Nutrition: Goal: Adequate nutrition will be maintained 11/01/2020 1502 by Shanon Ace, RN Outcome: Adequate for Discharge 11/01/2020 1502 by Shanon Ace, RN Outcome: Progressing 11/01/2020 0738 by Shanon Ace, RN Outcome: Progressing   Problem: Elimination: Goal: Will not experience complications related to bowel motility 11/01/2020 1502 by Shanon Ace, RN Outcome: Adequate for Discharge 11/01/2020 1502 by Shanon Ace, RN Outcome: Progressing 11/01/2020 0738 by Shanon Ace, RN Outcome: Progressing Goal: Will not experience complications related to urinary retention 11/01/2020 1502 by Shanon Ace, RN Outcome: Adequate for Discharge 11/01/2020 1502 by Shanon Ace, RN Outcome: Progressing 11/01/2020 0738 by Shanon Ace, RN Outcome: Progressing   Problem: Pain Managment: Goal: General experience of comfort will improve 11/01/2020 1502 by Shanon Ace, RN Outcome: Adequate for Discharge 11/01/2020 1502 by Shanon Ace, RN Outcome: Progressing 11/01/2020 0738 by Shanon Ace, RN Outcome: Progressing   Problem: Safety: Goal: Ability to remain free from injury will improve 11/01/2020 1502 by Shanon Ace,  RN Outcome: Adequate for Discharge 11/01/2020 1502 by Shanon Ace, RN Outcome: Progressing 11/01/2020 0738 by Shanon Ace, RN Outcome: Progressing   Problem: Skin Integrity: Goal: Risk for impaired skin integrity will decrease 11/01/2020 1502 by Shanon Ace, RN Outcome: Adequate for Discharge 11/01/2020 1502 by Shanon Ace, RN Outcome: Progressing 11/01/2020 0738 by Shanon Ace, RN Outcome: Progressing

## 2020-11-01 NOTE — Progress Notes (Signed)
CRITICAL VALUE ALERT  Critical Value:  K+ 6.3  Date & Time Notified:  11/01/20 0155  Provider Notified: Dr. Tonie Griffith of [facility]  Orders Received/Actions taken: No new orders

## 2020-11-01 NOTE — Progress Notes (Signed)
Pt taken to front of hospital to meet her daughter Via w/c

## 2020-11-01 NOTE — Progress Notes (Signed)
PT Cancellation Note  Patient Details Name: Whitney Walker MRN: [id number] DOB: Dec 31, 1955   Cancelled Treatment:    Reason Eval/Treat Not Completed: (P) Patient declined, no reason specified (Pt reports she feels comfident with mobility and stair training.  Pt politely declined PT services at this time.)   Cristela Blue 11/01/2020, 3:02 PM  Erasmo Leventhal , PTA Acute Rehabilitation Services Pager [phone] Office [phone]

## 2020-11-01 NOTE — TOC Transition Note (Signed)
Transition of Care [facility]) - CM/SW Discharge Note   Patient Details  Name: Whitney Walker MRN: [id number] Date of Birth: [birth year]  [facility] of [facility] CM/SW Contact:  Zenon Mayo, RN Phone Number: 11/01/2020, 12:56 PM   Clinical Narrative:    From home alone, for dc today, per pt /ot eval no f/u needed.  Final next level of care: Home/Self Care Barriers to Discharge: No Barriers Identified   Patient Goals and CMS Choice     Choice offered to / list presented to : NA  Discharge Placement                       Discharge Plan and Services   Discharge Planning Services: CM Consult Post Acute Care Choice: NA            DME Agency: NA       HH Arranged: NA          Social Determinants of Health (SDOH) Interventions     Readmission Risk Interventions Readmission Risk Prevention Plan 11/01/2020  Transportation Screening Complete  PCP or Specialist Appt within 3-5 Days Complete  HRI or [place] Complete  Social Work Consult for [place] Planning/Counseling Complete  Palliative Care Screening Not Applicable  Medication Review [employer] photographer) Complete  Some recent data might be hidden

## 2020-11-01 NOTE — TOC Initial Note (Signed)
Transition of Care [facility]) - Initial/Assessment Note    Patient Details  Name: Whitney Walker MRN: [id number] Date of Birth: Jul 06, 1956  Transition of Care [facility]) CM/SW Contact:    Zenon Mayo, RN Phone Number: 11/01/2020, 12:55 PM  Clinical Narrative:                 From home alone, for dc today, per pt /ot eval no f/u needed.    Expected Discharge Plan: Home/Self Care Barriers to Discharge: No Barriers Identified   Patient Goals and CMS Choice     Choice offered to / list presented to : NA  Expected Discharge Plan and Services Expected Discharge Plan: Home/Self Care   Discharge Planning Services: CM Consult Post Acute Care Choice: NA Living arrangements for the past 2 months: Single Family Home                   DME Agency: NA       HH Arranged: NA          Prior Living Arrangements/Services Living arrangements for the past 2 months: Single Family Home Lives with:: Self Patient language and need for interpreter reviewed:: Yes Do you feel safe going back to the place where you live?: Yes      Need for Family Participation in Patient Care: No (Comment) Care giver support system in place?: No (comment)   Criminal Activity/Legal Involvement Pertinent to Current Situation/Hospitalization: No - Comment as needed  Activities of Daily Living      Permission Sought/Granted                  Emotional Assessment       Orientation: : Oriented to Place, Oriented to  Time, Oriented to Situation, Oriented to Self Alcohol / Substance Use: Not Applicable Psych Involvement: No (comment)  Admission diagnosis:  Hyperkalemia [E87.5] Patient Active Problem List   Diagnosis Date Noted  . Hyperkalemia 05/09/2020  . Acute respiratory failure with hypoxia ([place]) 05/09/2020  . Left leg pain 05/09/2020  . Restless leg syndrome 09/19/2018  . OSA (obstructive sleep apnea) 04/29/2018  . ESRD on dialysis ([place]) 04/29/2018  . Dyspnea on exertion 03/12/2017   . Morbid (severe) obesity due to excess calories ([place]) 03/12/2017  . Anemia of chronic kidney failure, stage 3 (moderate) ([place]) 12/10/2016  . History of colon cancer 08/12/2016  . Mass of left breast on mammogram 12/10/2015  . HTN (hypertension) 10/18/2015  . Depression 10/18/2015  . Numbness and tingling of left arm and leg 01/24/2015  . PVD (peripheral vascular disease) with claudication ([place]) 03/23/2014  . PAD (peripheral artery disease) ([place]) 12/14/2013  . Chronic diastolic heart failure ([place]) 11/15/2013  . Essential hypertension 11/15/2013  . Leucocytosis 04/28/2012   PCP:  [name], [place] Pharmacy:   [facility] - [place], [place] - [address] [place] [address] Phone: [phone] Fax: [phone]     Social Determinants of Health (SDOH) Interventions    Readmission Risk Interventions Readmission Risk Prevention Plan 11/01/2020  Transportation Screening Complete  PCP or Specialist Appt within 3-5 Days Complete  HRI or [place] Complete  Social Work Consult for [place] Planning/Counseling Complete  Palliative Care Screening Not Applicable  Medication Review Press photographer) Complete  Some recent data might be hidden

## 2020-11-01 NOTE — Discharge Summary (Addendum)
Physician Discharge Summary  Orva Riles TFT:[id number] DOB: 26-Apr-1956 DOA: 10/30/2020  PCP: [name], FNP  Admit date: 10/30/2020 Discharge date: 11/01/2020  Admitted From: Home Disposition: Home  Recommendations for Outpatient Follow-up:  Follow-up with PCP in 1 week Repeat CBC and BMP on follow-up visit Follow-up with cardiology in 1 week Dialysis as per schedule Avoid high potassium diet  Home Health:none Equipment/Devices: none Discharge Condition: Stable CODE STATUS: Full code Diet recommendation: Low potassium, low phosphorus, 1.2 L fluid restriction diet/renal diet  Brief/Interim Summary: Patient is 64 year old female with past medical history of ESRD on hemodialysis on M WF, hypertension, anemia of chronic disease, 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  Patient is complaining of bilateral lower extremity weakness and gait issues.  She fell yesterday however denies any head or back trauma.  She denies any urinary or bowel incontinence.  In ED: Patient was placed on BiPAP since patient was short of breath and chest x-ray shows congestion.  Labs showed potassium of more than 7.5 with EKG: Peaked T waves and broad QRS complex.  Initial troponin: 31, hemoglobin: 11.1, afebrile with leukocytosis of 12.8.  COVID-19 negative.  Patient was given IV calcium gluconate with D50 with 5 units of insulin and albuterol in ED.  Nephrology was consulted and patient was taken to urgent hemodialysis.  Severe hyperkalemia: -Likely combination of shortened dialysis treatments and increased dietary potassium intake - with known history of ESRD. -Patient was given IV calcium gluconate, D50 with 5 units of insulin and albuterol in ED. -Nephrology consulted and patient was taken to urgent hemodialysis on 11/2.. -Potassium improved.  Patient had another session of dialysis on 11/01/2020.  Nephrology is okay to discharge patient  home.  Acute hypoxemic respiratory failure in the setting of fluid overload.  Resolved. Initially patient was placed on BiPAP and then nasal cannula.[name]  BNP is elevated: 406. Chest x-ray showed vascular congestion and cardiomegaly.  No pulmonary edema.  Fluid management per nephrology. -On room air on the day of discharge.  Bilateral lower extremity weakness: Resolved. -Likely secondary to electrolyte derangement -PT/OT consulted recommended no PT follow-up.[name]  Elevated troponin: -Initial troponin 31, trended up to 138--> 458--> 477--> 402.  Likely demand ischemia in the setting of volume overload secondary to ESRD. -Reviewed EKG.  -Discussed with cardiology Dr. Einar Gip on 11/2-  Her elevated troponin is likely secondary to type II MI due to underlying CHF.  No cardiac intervention indicated at this time.  Recommended outpatient follow-up.  Hypertension:  Elevated upon arrival to the ED.  Improved.  Blood pressure 137/88 at the time of discharge.  -Continued home meds Coreg and losartan.    Anemia of chronic disease: H&H remained stable  History of PE/DVT: Continued Eliquis  Mild leukocytosis: Patient remained afebrile.  Chest x-ray shows no pneumonia.  COVID-19 negative.  Likely reactive.  Improved from 12.8-11.2 on the day of discharge.  Repeat CBC with PCP on follow-up visit.[name]  Discharge Diagnoses:  Severe hyperkalemia Acute hypoxemic respiratory failure in the setting of fluid overload Elevated troponin Bilateral lower extremity weakness Hypertension Anemia of chronic disease History of DVT/PE  Discharge Instructions  Discharge Instructions    Diet - low sodium heart healthy   Complete by: As directed    Discharge instructions   Complete by: As directed    Follow-up with PCP in 1 week Follow-up with cardiology in 1 week Dialysis as per schedule Strict low potassium diet/renal diet   Increase activity slowly  Complete by: As directed      Allergies as of 11/01/2020       Reactions   Carnosine    Other reaction(s): Unknown   Gadolinium Derivatives Hives, Other (See Comments)   HIVES, Desc: HIVES W/ "DYE" USED FOR 1ST CT SCAN BUT NOT 2ND, NO PREMEDS USED, PT UNCERTAIN OF CIRCUMSTANCES,,?POSSIBLE MRI CONTRAST ALLERGY, ALL STUDIES DONE "SOMEWHERE" IN [place]//A.C., Onset Date: [id number]   Iohexol Other (See Comments)    Code: HIVES, Desc: HIVES W/ "DYE" USED FOR 1ST CT SCAN BUT NOT 2ND, NO PREMEDS USED, PT UNCERTAIN OF CIRCUMSTANCES,,?POSSIBLE MRI CONTRAST ALLERGY, ALL STUDIES DONE "SOMEWHERE" IN [place]//A.C., Onset Date: [id number]   Iodine Hives   Naltrexone    Other reaction(s): Unknown      Medication List    STOP taking these medications   amoxicillin 500 MG tablet Commonly known as: AMOXIL   eszopiclone 2 MG Tabs tablet Commonly known as: Lunesta   temazepam 15 MG capsule Commonly known as: RESTORIL     TAKE these medications   acetaminophen 500 MG tablet Commonly known as: TYLENOL Take 1,000 mg by mouth 2 (two) times daily as needed for moderate pain or headache.   albuterol 108 (90 Base) MCG/ACT inhaler Commonly known as: VENTOLIN HFA Inhale 2 puffs into the lungs every 6 (six) hours as needed for wheezing or shortness of breath.   amLODipine 5 MG tablet Commonly known as: NORVASC Take 1 tablet (5 mg total) by mouth daily.   apixaban 2.5 MG Tabs tablet Commonly known as: Eliquis Take 1 tablet (2.5 mg total) by mouth 2 (two) times daily.   Auryxia 1 GM 210 MG(Fe) tablet Generic drug: ferric citrate Take 2-4 mg by mouth See admin instructions. 4gm with meal 2gm with snack   Biotin 10000 MCG Tabs Take 10,000 mcg by mouth daily.   carvedilol 12.5 MG tablet Commonly known as: COREG Take 12.5 mg by mouth 2 (two) times daily with a meal.   meclizine 12.5 MG tablet Commonly known as: ANTIVERT Take 1 tablet (12.5 mg total) by mouth 3 (three) times daily as needed for dizziness.   multivitamin Tabs tablet Take 1  tablet by mouth at bedtime.   mupirocin ointment 2 % Commonly known as: BACTROBAN Apply 1 application topically daily as needed (skin bumps).   nystatin powder Commonly known as: nystatin Apply 1 application topically 4 (four) times daily as needed. What changed: reasons to take this   ondansetron 4 MG disintegrating tablet Commonly known as: Zofran ODT Take 1 tablet (4 mg total) by mouth every 6 (six) hours as needed. What changed: reasons to take this   sevelamer carbonate 800 MG tablet Commonly known as: RENVELA Take 1,600-3,200 mg by mouth 3 (three) times daily with meals. 1600mg  with snacks 3200mg  with meals   vitamin C 100 MG tablet Take 100 mg by mouth daily.   vitamin E 200 UNIT capsule Generic drug: vitamin E Take 200 Units by mouth daily.       Follow-up Information    [name], FNP.   Specialty: General Practice Why: please make follow up apt Contact information: [address] [place] [address] [phone]        [name], MD.   Specialty: Cardiology Why: Nov. 5,2021 @1 :PM Contact information: [address] [place] [address] [phone]              Allergies  Allergen Reactions  . Carnosine     Other  reaction(s): Unknown  . Gadolinium Derivatives Hives and Other (See Comments)    HIVES, Desc: HIVES W/ "DYE" USED FOR 1ST CT SCAN BUT NOT 2ND, NO PREMEDS USED, PT UNCERTAIN OF CIRCUMSTANCES,,?POSSIBLE MRI CONTRAST ALLERGY, ALL STUDIES DONE "SOMEWHERE" IN [place]//A.C., Onset Date: [id number]  . Iohexol Other (See Comments)     Code: HIVES, Desc: HIVES W/ "DYE" USED FOR 1ST CT SCAN BUT NOT 2ND, NO PREMEDS USED, PT UNCERTAIN OF CIRCUMSTANCES,,?POSSIBLE MRI CONTRAST ALLERGY, ALL STUDIES DONE "SOMEWHERE" IN [place]//A.C., Onset Date: [id number]   . Iodine Hives  . Naltrexone     Other reaction(s): Unknown    Consultations:     Procedures/Studies: DG Chest Port 1 View  Result Date:  10/30/2020 CLINICAL DATA:  Shortness of breath EXAM: PORTABLE CHEST 1 VIEW COMPARISON:  09/28/2020 FINDINGS: Cardiopericardial enlargement and aortic tortuosity. Dialysis catheter with tip at the upper cavoatrial junction. Extensive artifact from EKG leads. Diffuse vascular prominence. No Kerley lines, effusion, or pneumothorax. IMPRESSION: Cardiomegaly and vascular congestion. Electronically Signed   By: Monte Fantasia M.D.   On: 10/30/2020 04:23      Subjective: Patient seen and examined at hemodialysis unit.  Tells me that she feels better and denies any further episodes of chest pain, shortness of breath, palpitation or leg swelling.  Denies bilateral lower extremity weakness.  Wishes to go home today.  Discharge Exam: Vitals:   11/01/20 1230 11/01/20 1337  BP: 133/76 137/88  Pulse:  (!) 101  Resp:  18  Temp:  97.9 F (36.6 C)  SpO2:  100%   Vitals:   11/01/20 1130 11/01/20 1200 11/01/20 1230 11/01/20 1337  BP: 129/82 (!) 143/75 133/76 137/88  Pulse: 85   (!) 101  Resp: 12   18  Temp:    97.9 F (36.6 C)  TempSrc:    Oral  SpO2: 100%   100%  Weight:      Height:        General: Pt is alert, awake, not in acute distress Cardiovascular: RRR, S1/S2 +, no rubs, no gallops Respiratory: CTA bilaterally, no wheezing, no rhonchi Abdominal: Soft, NT, ND, bowel sounds + Extremities: no edema, no cyanosis    The results of significant diagnostics from this hospitalization (including imaging, microbiology, ancillary and laboratory) are listed below for reference.     Microbiology: Recent Results (from the past 240 hour(s))  Respiratory Panel by RT PCR (Flu A&B, Covid) - Nasopharyngeal Swab     Status: None   Collection Time: 10/30/20  4:49 AM   Specimen: Nasopharyngeal Swab  Result Value Ref Range Status   SARS Coronavirus 2 by RT PCR NEGATIVE NEGATIVE Final    Comment: (NOTE) SARS-CoV-2 target nucleic acids are NOT DETECTED.  The SARS-CoV-2 RNA is generally detectable in  upper respiratoy specimens during the acute phase of infection. The lowest concentration of SARS-CoV-2 viral copies this assay can detect is 131 copies/mL. A negative result does not preclude SARS-Cov-2 infection and should not be used as the sole basis for treatment or other patient management decisions. A negative result may occur with  improper specimen collection/handling, submission of specimen other than nasopharyngeal swab, presence of viral mutation(s) within the areas targeted by this assay, and inadequate number of viral copies (<131 copies/mL). A negative result must be combined with clinical observations, patient history, and epidemiological information. The expected result is Negative.  Fact Sheet for Patients:  PinkCheek.be  Fact Sheet for Healthcare Providers:  GravelBags.it  This test is no t yet approved or  cleared by the [place] and  has been authorized for detection and/or diagnosis of SARS-CoV-2 by FDA under an Emergency Use Authorization (EUA). This EUA will remain  in effect (meaning this test can be used) for the duration of the COVID-19 declaration under Section 564(b)(1) of the Act, 21 U.S.C. section 360bbb-3(b)(1), unless the authorization is terminated or revoked sooner.     Influenza A by PCR NEGATIVE NEGATIVE Final   Influenza B by PCR NEGATIVE NEGATIVE Final    Comment: (NOTE) The Xpert Xpress SARS-CoV-2/FLU/RSV assay is intended as an aid in  the diagnosis of influenza from Nasopharyngeal swab specimens and  should not be used as a sole basis for treatment. Nasal washings and  aspirates are unacceptable for Xpert Xpress SARS-CoV-2/FLU/RSV  testing.  Fact Sheet for Patients: PinkCheek.be  Fact Sheet for Healthcare Providers: GravelBags.it  This test is not yet approved or cleared by the [place] FDA and  has been  authorized for detection and/or diagnosis of SARS-CoV-2 by  FDA under an Emergency Use Authorization (EUA). This EUA will remain  in effect (meaning this test can be used) for the duration of the  Covid-19 declaration under Section 564(b)(1) of the Act, 21  U.S.C. section 360bbb-3(b)(1), unless the authorization is  terminated or revoked. Performed at [facility], [place] [address], [place], [place] [address]   MRSA PCR Screening     Status: None   Collection Time: 10/30/20  2:30 PM   Specimen: Nasal Mucosa; Nasopharyngeal  Result Value Ref Range Status   MRSA by PCR NEGATIVE NEGATIVE Final    Comment:        The GeneXpert MRSA Assay (FDA approved for NASAL specimens only), is one component of a comprehensive MRSA colonization surveillance program. It is not intended to diagnose MRSA infection nor to guide or monitor treatment for MRSA infections. Performed at [facility], [place] [address], [place], [place] [address]      Labs: BNP (last 3 results) Recent Labs    10/30/20 0407  BNP 828.0*   Basic Metabolic Panel: Recent Labs  Lab 10/30/20 0407 10/30/20 0603 10/30/20 1735 10/31/20 0027 11/01/20 0035  NA 131*  --   --  136 135  K >7.5* 5.3* 5.0 5.2* 6.3*  CL 98  --   --  93* 95*  CO2 17*  --   --  28 25  GLUCOSE 98  --   --  100* 99  BUN 69*  --   --  39* 61*  CREATININE 12.21*  --   --  8.04* 10.69*  CALCIUM 9.8  --   --  8.9 9.1  MG  --   --  1.9  --  1.9  PHOS  --   --   --  9.2* 10.1*   Liver Function Tests: Recent Labs  Lab 10/31/20 0027 11/01/20 0035  ALBUMIN 2.9* 2.9*   No results for input(s): LIPASE, AMYLASE in the last 168 hours. No results for input(s): AMMONIA in the last 168 hours. CBC: Recent Labs  Lab 10/30/20 0407 10/31/20 0027 11/01/20 0035  WBC 12.8* 11.4* 11.2*  NEUTROABS 8.9*  --   --   HGB 11.1* 10.2* 9.8*  HCT 35.7* 33.0* 31.0*  MCV 92.7 92.2 92.0  PLT 202 148* 161   Cardiac Enzymes: No results for input(s):  CKTOTAL, CKMB, CKMBINDEX, TROPONINI in the last 168 hours. BNP: Invalid input(s): POCBNP CBG: No results for input(s): GLUCAP in the last 168  hours. D-Dimer No results for input(s): DDIMER in the last 72 hours. Hgb A1c No results for input(s): HGBA1C in the last 72 hours. Lipid Profile No results for input(s): CHOL, HDL, LDLCALC, TRIG, CHOLHDL, LDLDIRECT in the last 72 hours. Thyroid function studies No results for input(s): TSH, T4TOTAL, T3FREE, THYROIDAB in the last 72 hours.  Invalid input(s): FREET3 Anemia work up No results for input(s): VITAMINB12, FOLATE, FERRITIN, TIBC, IRON, RETICCTPCT in the last 72 hours. Urinalysis    Component Value Date/Time   COLORURINE YELLOW 03/02/2019 1416   APPEARANCEUR CLEAR 03/02/2019 1416   LABSPEC 1.010 03/02/2019 1416   PHURINE 6.0 03/02/2019 1416   GLUCOSEU 50 (A) 03/02/2019 1416   HGBUR SMALL (A) 03/02/2019 1416   BILIRUBINUR NEGATIVE 03/02/2019 1416   KETONESUR 5 (A) 03/02/2019 1416   PROTEINUR 100 (A) 03/02/2019 1416   UROBILINOGEN 0.2 10/19/2015 1532   NITRITE NEGATIVE 03/02/2019 1416   LEUKOCYTESUR NEGATIVE 03/02/2019 1416   Sepsis Labs Invalid input(s): PROCALCITONIN,  WBC,  LACTICIDVEN Microbiology Recent Results (from the past 240 hour(s))  Respiratory Panel by RT PCR (Flu A&B, Covid) - Nasopharyngeal Swab     Status: None   Collection Time: 10/30/20  4:49 AM   Specimen: Nasopharyngeal Swab  Result Value Ref Range Status   SARS Coronavirus 2 by RT PCR NEGATIVE NEGATIVE Final    Comment: (NOTE) SARS-CoV-2 target nucleic acids are NOT DETECTED.  The SARS-CoV-2 RNA is generally detectable in upper respiratoy specimens during the acute phase of infection. The lowest concentration of SARS-CoV-2 viral copies this assay can detect is 131 copies/mL. A negative result does not preclude SARS-Cov-2 infection and should not be used as the sole basis for treatment or other patient management decisions. A negative result may occur  with  improper specimen collection/handling, submission of specimen other than nasopharyngeal swab, presence of viral mutation(s) within the areas targeted by this assay, and inadequate number of viral copies (<131 copies/mL). A negative result must be combined with clinical observations, patient history, and epidemiological information. The expected result is Negative.  Fact Sheet for Patients:  PinkCheek.be  Fact Sheet for Healthcare Providers:  GravelBags.it  This test is no t yet approved or cleared by the [place] FDA and  has been authorized for detection and/or diagnosis of SARS-CoV-2 by FDA under an Emergency Use Authorization (EUA). This EUA will remain  in effect (meaning this test can be used) for the duration of the COVID-19 declaration under Section 564(b)(1) of the Act, 21 U.S.C. section 360bbb-3(b)(1), unless the authorization is terminated or revoked sooner.     Influenza A by PCR NEGATIVE NEGATIVE Final   Influenza B by PCR NEGATIVE NEGATIVE Final    Comment: (NOTE) The Xpert Xpress SARS-CoV-2/FLU/RSV assay is intended as an aid in  the diagnosis of influenza from Nasopharyngeal swab specimens and  should not be used as a sole basis for treatment. Nasal washings and  aspirates are unacceptable for Xpert Xpress SARS-CoV-2/FLU/RSV  testing.  Fact Sheet for Patients: PinkCheek.be  Fact Sheet for Healthcare Providers: GravelBags.it  This test is not yet approved or cleared by the [place] FDA and  has been authorized for detection and/or diagnosis of SARS-CoV-2 by  FDA under an Emergency Use Authorization (EUA). This EUA will remain  in effect (meaning this test can be used) for the duration of the  Covid-19 declaration under Section 564(b)(1) of the Act, 21  U.S.C. section 360bbb-3(b)(1), unless the authorization is  terminated or  revoked. Performed at [facility]  [facility], [place] [address], [place], [place] [address]   MRSA PCR Screening     Status: None   Collection Time: 10/30/20  2:30 PM   Specimen: Nasal Mucosa; Nasopharyngeal  Result Value Ref Range Status   MRSA by PCR NEGATIVE NEGATIVE Final    Comment:        The GeneXpert MRSA Assay (FDA approved for NASAL specimens only), is one component of a comprehensive MRSA colonization surveillance program. It is not intended to diagnose MRSA infection nor to guide or monitor treatment for MRSA infections. Performed at [facility], [place] [address] [place], [place] [address]      Time coordinating discharge: Over 30 minutes  SIGNED:   Mckinley Jewel, MD  [facility] 11/01/2020, 2:49 PM Pager   If 7PM-7AM, please contact night-coverage [email or web address]

## 2020-11-02 ENCOUNTER — Telehealth: Payer: Self-pay | Admitting: Pharmacist

## 2020-11-02 NOTE — Chronic Care Management (AMB) (Signed)
Chronic Care Management Pharmacy Assistant   Name: Whitney Walker  MRN: [id number] DOB: Oct 26, 1956  Reason for Encounter: Medication Review  Patient Questions:  1.  Have you seen any other providers since your last visit? Yes, 10/07/2021[name], RN (CCM), 10/30/2020- 11/01/2020 [facility] Ad[facility].  2.  Any changes in your medicines or health? Yes, Amoxicillin 500 mg, Eszopiclone 2 mg and Temazepam 15 mg discontinued from ED admission.    PCP : [name], FNP  Allergies:   Allergies  Allergen Reactions  . Carnosine     Other reaction(s): Unknown  . Gadolinium Derivatives Hives and Other (See Comments)    HIVES, Desc: HIVES W/ "DYE" USED FOR 1ST CT SCAN BUT NOT 2ND, NO PREMEDS USED, PT UNCERTAIN OF CIRCUMSTANCES,,?POSSIBLE MRI CONTRAST ALLERGY, ALL STUDIES DONE "SOMEWHERE" IN [place]//A.C., Onset Date: [id number]  . Iohexol Other (See Comments)     Code: HIVES, Desc: HIVES W/ "DYE" USED FOR 1ST CT SCAN BUT NOT 2ND, NO PREMEDS USED, PT UNCERTAIN OF CIRCUMSTANCES,,?POSSIBLE MRI CONTRAST ALLERGY, ALL STUDIES DONE "SOMEWHERE" IN [place]//A.C., Onset Date: [id number]   . Iodine Hives  . Naltrexone     Other reaction(s): Unknown    Medications: Outpatient Encounter Medications as of 11/02/2020  Medication Sig Note  . acetaminophen (TYLENOL) 500 MG tablet Take 1,000 mg by mouth 2 (two) times daily as needed for moderate pain or headache.   . albuterol (VENTOLIN HFA) 108 (90 Base) MCG/ACT inhaler Inhale 2 puffs into the lungs every 6 (six) hours as needed for wheezing or shortness of breath.   [name] amLODipine (NORVASC) 5 MG tablet Take 1 tablet (5 mg total) by mouth daily.   [name] apixaban (ELIQUIS) 2.5 MG TABS tablet Take 1 tablet (2.5 mg total) by mouth 2 (two) times daily.   . Ascorbic Acid (VITAMIN C) 100 MG tablet Take 100 mg by mouth daily.   Lorin Picket 1 GM 210 MG(Fe) tablet Take 2-4 mg by mouth See admin instructions. 4gm with meal 2gm with snack 10/30/2020:  Has not started, starting when she leaves hospital to get prescription  . Biotin 10000 MCG TABS Take 10,000 mcg by mouth daily.    . carvedilol (COREG) 12.5 MG tablet Take 12.5 mg by mouth 2 (two) times daily with a meal.    . meclizine (ANTIVERT) 12.5 MG tablet Take 1 tablet (12.5 mg total) by mouth 3 (three) times daily as needed for dizziness.   . multivitamin (RENA-VIT) TABS tablet Take 1 tablet by mouth at bedtime.   . mupirocin ointment (BACTROBAN) 2 % Apply 1 application topically daily as needed (skin bumps).   . nystatin (NYSTATIN) powder Apply 1 application topically 4 (four) times daily as needed. (Patient taking differently: Apply 1 application topically 4 (four) times daily as needed (irriation). )   . ondansetron (ZOFRAN ODT) 4 MG disintegrating tablet Take 1 tablet (4 mg total) by mouth every 6 (six) hours as needed. (Patient taking differently: Take 4 mg by mouth every 6 (six) hours as needed for nausea or vomiting. )   . sevelamer carbonate (RENVELA) 800 MG tablet Take 1,600-3,200 mg by mouth 3 (three) times daily with meals. 1600mg  with snacks 3200mg  with meals 10/30/2020: DC as of 10/30/2020 & start ayruxia  . vitamin E (VITAMIN E) 200 UNIT capsule Take 200 Units by mouth daily.    No facility-administered encounter medications on file as of 11/02/2020.    Current Diagnosis: Patient Active Problem List   Diagnosis Date Noted  .  Hyperkalemia 05/09/2020  . Acute respiratory failure with hypoxia ([place]) 05/09/2020  . Left leg pain 05/09/2020  . Restless leg syndrome 09/19/2018  . OSA (obstructive sleep apnea) 04/29/2018  . ESRD on dialysis ([place]) 04/29/2018  . Dyspnea on exertion 03/12/2017  . Morbid (severe) obesity due to excess calories ([place]) 03/12/2017  . Anemia of chronic kidney failure, stage 3 (moderate) ([place]) 12/10/2016  . History of colon cancer 08/12/2016  . Mass of left breast on mammogram 12/10/2015  . HTN (hypertension) 10/18/2015  . Depression 10/18/2015  .  Numbness and tingling of left arm and leg 01/24/2015  . PVD (peripheral vascular disease) with claudication ([place]) 03/23/2014  . PAD (peripheral artery disease) ([place]) 12/14/2013  . Chronic diastolic heart failure ([place]) 11/15/2013  . Essential hypertension 11/15/2013  . Leucocytosis 04/28/2012   Reviewed chart for medication changes ahead of medication coordination call.  OVs, Consults, or hospital visits - 10/05/2020- [name], RN (CCM), 10/30/2020- 11/01/2020 [facility] since last care coordination call/Pharmacist visit.  Medication changes indicated:  Amoxicillin 500 mg, Eszopiclone 2 mg and Temazepam 15 mg discontinued from ED admission.   BP Readings from Last 3 Encounters:  11/01/20 137/88  09/26/20 120/82  08/03/20 (!) 190/84    Lab Results  Component Value Date   HGBA1C 5.1 06/04/2018     Patient obtains medications through Vials  90 Days   Last adherence delivery included: Delivery not needed at last adherence call on 10/02/2020.  Patient declined the following medications last month: Amlodipine 5 mg- 1 tablet daily due to receiving a 90 day on 09/28/20 (new medication that was recently increased), Carvedilol 12.5 mg- 1 tablet by mouth twice a day(M/W/F dialysis days once daily(EM); T/Th/Sa/S Breakfast, EM) due to receiving a 90 day supply on 08/04/2020 patient does not take twice a day on dialysis days has an adequate supply on hand, Eliquis 2.5 mg - 1 tablet by mouth twice a day (Breakfast, EM) due to receiving a 90 day supply on 08/04/2020 has an adequate supply on hand, Lunesta 2 mg- 1 tablet nightly (bedtime) due to not helping with Insomnia, Nystatin 100 gm- apply four times a day as needed, and Albuterol 108 mcg/act inhaler - 2 puffs into the lungs every 6 hours as needed for wheezing or shortness of breath.  Patient is due for next adherence delivery on: 11/06/2020. Called patient and reviewed medications and coordinated delivery.  This delivery to include:  None, patient has received prescriptions for Carvedilol 12.5 mg- 1 tablet by mouth twice a day (M/W/F dialysis days once daily(EM); T/Th/Sa/S Breakfast, (EM) and Eliquis 2.5 mg - 1 tablet by mouth twice a day (Breakfast, EM) on 11/02/2020.   Patient will need a short fill of Albuterol inhaler prior to adherence delivery due to PRN.  Coordinated acute fill for Albuterol to be delivered 11/03/2020.  Patient declined the following medications  Amlodipine 5 mg- 1 tablet daily due to receiving a 90 day on 09/28/20, Lunesta 2 mg- 1 tablet nightly (bedtime) due to not helping with Insomnia patient discontinued medication, Nystatin 100 gm- apply four times a day as needed, Meclizine and Ondansetron 4 mg due to PRN use.  No Refills Needed.  Patient has been in contact with [facility] for delivery on 11/01/2020. Medication received while on the phone with patient.   Follow-Up:  Coordination of Enhanced Pharmacy Services and Pharmacist Review  Patient inquired if she could use a nebulizer machine for administration of albuterol vs using an inhaler, she feel  like the nebulizer treatments help her better with her shortness of breath. Patient aware I will send information to Leata Mouse, CPP to discuss with PCP and will call back with information. Informed if I don't hear anything back for patient the next day, we will send out an Albuterol inhaler due to being out. Patient aware. Leata Mouse, CPP notified.  11/03/2020- No response back from PCP, sent in acute form for Albuterol inhaler for 11/03/2020 delivery. Patient notified. Will follow up with response from PCP. Patient scheduled to see PCP on 11/08/2020 for hospital follow up.  11/08/2020- Request received from PCP in assisting patient to receive a Nebulizer machine, albuterol 0.083% nebulizer solution sent to [facility] and delivered to patient.   11/09/2020- Patient called with some concerns about her Carvedilol instructions on medication label.  [facility] was also called by patient, both I and the pharmacy communicated with patient Carvedilol instructions and label correction in process along with contact with [facility] and Nephrologist. Patient is to take  Carvedilol 12.5 mg- 1 tablet by mouth twice a day (M/W/F dialysis days once daily(EM); T/Th/Sa/S Breakfast, (EM)  Patient states she has been doing this just unable to see Thursday on her label.   Called [employer] to inquire on nebulizer machine and coverage, they do not file insurance, patient would have to pay out of pocket for machine.  [employer] to see what and where patient has Multimedia programmer. Spoke with representative, patient will have a 20% coinsurance or a $0 copay if she has a medicaid as secondary coverage. Representative gave three companies that are in network for patient. Second to [employer] [phone] and [employer] [phone].  Called [facility] first, spoke with Estill Bamberg she informed me that they will file patients insurance [place], they will also deliver nebulizer machine and nebulizer solution to patient, there is a $6 set up fee and a monthly fee of $1.13. [facility] will need an order, demographics, copy of insurance card and last office note faxed to [phone] by PCP.   Called patient, gave information from [place] and prices, patient is ok with this plan and aware I will send message to pharmacist to request information needed to be faxed to [place].   11/10/2020- Patient called and decided to decline nebulizer machine, will stick with albuterol inhaler.  [name], CPP notified.  [name], [place] Pharmacist Assistant [phone]

## 2020-11-03 ENCOUNTER — Telehealth: Payer: Self-pay

## 2020-11-03 ENCOUNTER — Ambulatory Visit: Payer: Self-pay | Admitting: Student

## 2020-11-03 DIAGNOSIS — Z992 Dependence on renal dialysis: Secondary | ICD-10-CM | POA: Diagnosis not present

## 2020-11-03 DIAGNOSIS — N2581 Secondary hyperparathyroidism of renal origin: Secondary | ICD-10-CM | POA: Diagnosis not present

## 2020-11-03 DIAGNOSIS — D631 Anemia in chronic kidney disease: Secondary | ICD-10-CM | POA: Diagnosis not present

## 2020-11-03 DIAGNOSIS — N186 End stage renal disease: Secondary | ICD-10-CM | POA: Diagnosis not present

## 2020-11-03 DIAGNOSIS — G4733 Obstructive sleep apnea (adult) (pediatric): Secondary | ICD-10-CM | POA: Diagnosis not present

## 2020-11-03 DIAGNOSIS — D689 Coagulation defect, unspecified: Secondary | ICD-10-CM | POA: Diagnosis not present

## 2020-11-03 NOTE — Telephone Encounter (Signed)
Pt called asking about her appts that was upcoming, called pt to make her aware she only has an upcoming Hospital f/u appt we have cancelled all the other appts.

## 2020-11-06 DIAGNOSIS — D631 Anemia in chronic kidney disease: Secondary | ICD-10-CM | POA: Diagnosis not present

## 2020-11-06 DIAGNOSIS — N2581 Secondary hyperparathyroidism of renal origin: Secondary | ICD-10-CM | POA: Diagnosis not present

## 2020-11-06 DIAGNOSIS — D689 Coagulation defect, unspecified: Secondary | ICD-10-CM | POA: Diagnosis not present

## 2020-11-06 DIAGNOSIS — Z992 Dependence on renal dialysis: Secondary | ICD-10-CM | POA: Diagnosis not present

## 2020-11-06 DIAGNOSIS — N186 End stage renal disease: Secondary | ICD-10-CM | POA: Diagnosis not present

## 2020-11-07 ENCOUNTER — Encounter: Payer: Self-pay | Admitting: Nurse Practitioner

## 2020-11-07 ENCOUNTER — Ambulatory Visit (INDEPENDENT_AMBULATORY_CARE_PROVIDER_SITE_OTHER): Payer: Medicare Other | Admitting: Nurse Practitioner

## 2020-11-07 ENCOUNTER — Other Ambulatory Visit: Payer: Self-pay

## 2020-11-07 VITALS — BP 124/80 | HR 80 | Temp 98.4°F | Ht 61.8 in | Wt 197.6 lb

## 2020-11-07 DIAGNOSIS — Z8709 Personal history of other diseases of the respiratory system: Secondary | ICD-10-CM

## 2020-11-07 DIAGNOSIS — E559 Vitamin D deficiency, unspecified: Secondary | ICD-10-CM | POA: Diagnosis not present

## 2020-11-07 DIAGNOSIS — Z720 Tobacco use: Secondary | ICD-10-CM

## 2020-11-07 DIAGNOSIS — E875 Hyperkalemia: Secondary | ICD-10-CM | POA: Diagnosis not present

## 2020-11-07 MED ORDER — ALBUTEROL SULFATE (2.5 MG/3ML) 0.083% IN NEBU: 2.5000 mg | INHALATION_SOLUTION | RESPIRATORY_TRACT | 2 refills | Status: DC | PRN

## 2020-11-07 NOTE — Progress Notes (Signed)
I,Whitney Walker [employer] as a Education administrator for [employer], FNP.,have documented all relevant documentation on the behalf of Whitney Brine, FNP,as directed by  Whitney Brine, FNP while in the presence of Whitney Walker, Whitney Walker. This visit occurred during the SARS-CoV-2 public health emergency.  Safety protocols were in place, including screening questions prior to the visit, additional usage of staff PPE, and extensive cleaning of exam room while observing appropriate contact time as indicated for disinfecting solutions.  Subjective:     Patient ID: Whitney Walker , female    DOB: 05/16/1956 , 64 y.o.   MRN: [id number]   Chief Complaint  Patient presents with  . Hospitalization Follow-up  . vitamin d recheck    HPI  She is here today for hospital follow up after presenting with shortness of breath, she was found to have hyperkalemia as high as 7.5. she was found to have acute respiratory failure in the setting of CHF which resolved while hospitalized. She also had bilateral lower extremity edema which has resolved. Her BNP was 406.  She had a CXR which showed vascular congestion and cardiomegaly.    She also had been complaining of bilateral lower extremity weakness and gait issues. She had a fall prior to her admission on 10/31 without injury.  She was placed on BiPAP and had congestion on her CXR.  She had peaked t-waves and broad QRS complex.  She also had an elevated white count of 12.8. She was treated with IV calcium gluconate and insulin.  She was also given emergency dialysis.    She declined having PT/OT at home after her discharge from the hospital. She has also been denied for [facility] services.     She is having vitamin d added to her dialysis.      Past Medical History:  Diagnosis Date  . Anemia   . Anxiety   . CHF (congestive heart failure) ([place])   . Colon cancer [facility])    treatment surgery  . Complication of anesthesia    after first C- [place] "couldnt walk after", patient  denies having a spinal  . COPD (chronic obstructive pulmonary disease) ([place])   . Coronary artery disease   . Depression   . DVT (deep venous thrombosis) ([place])   . ESRD (end stage renal disease) ([place])    Hemo: MWF  . History of blood transfusion 04/2018  . Hypertension    07/07/18- no longer takes BP medications  . Meningitis   . Pain in limb 07/30/2013  . PE (pulmonary embolism)   . Peripheral vascular disease ([place])   . Restless legs   . Shortness of breath    with exertion  . Sleep apnea   . SOB (shortness of breath) 03/03/2019  . Vertigo      Family History  Problem Relation Age of Onset  . Cancer Mother 67       breast and bone  . Cancer Father 80       prostate  . Hypertension Sister   . Bleeding Disorder Sister   . Cancer Cousin 20       breast cancer   . Hypertension Daughter   . Breast cancer Neg Hx      Current Outpatient Medications:  .  acetaminophen (TYLENOL) 500 MG tablet, Take 1,000 mg by mouth 2 (two) times daily as needed for moderate pain or headache., Disp: , Rfl:  .  albuterol (VENTOLIN HFA) 108 (90 Base) MCG/ACT inhaler, Inhale 2 puffs into the lungs every  6 (six) hours as needed for wheezing or shortness of breath., Disp: 18 g, Rfl: 0 .  amLODipine (NORVASC) 5 MG tablet, Take 1 tablet (5 mg total) by mouth daily., Disp: 90 tablet, Rfl: 1 .  apixaban (ELIQUIS) 2.5 MG TABS tablet, Take 1 tablet (2.5 mg total) by mouth 2 (two) times daily., Disp: 180 tablet, Rfl: 3 .  Ascorbic Acid (VITAMIN C) 100 MG tablet, Take 100 mg by mouth daily., Disp: , Rfl:  .  AURYXIA 1 GM 210 MG(Fe) tablet, Take 2-4 mg by mouth See admin instructions. 4gm with meal 2gm with snack, Disp: , Rfl:  .  Biotin 10000 MCG TABS, Take 10,000 mcg by mouth daily. , Disp: , Rfl:  .  carvedilol (COREG) 12.5 MG tablet, Take 12.5 mg by mouth 2 (two) times daily with a meal. , Disp: , Rfl:  .  meclizine (ANTIVERT) 12.5 MG tablet, Take 1 tablet (12.5 mg total) by mouth 3 (three) times daily as needed  for dizziness., Disp: 30 tablet, Rfl: 0 .  multivitamin (RENA-VIT) TABS tablet, Take 1 tablet by mouth at bedtime., Disp: , Rfl:  .  mupirocin ointment (BACTROBAN) 2 %, Apply 1 application topically daily as needed (skin bumps)., Disp: 22 g, Rfl: 2 .  nystatin (NYSTATIN) powder, Apply 1 application topically 4 (four) times daily as needed. (Patient taking differently: Apply 1 application topically 4 (four) times daily as needed (irriation). ), Disp: 30 g, Rfl: 1 .  ondansetron (ZOFRAN ODT) 4 MG disintegrating tablet, Take 1 tablet (4 mg total) by mouth every 6 (six) hours as needed. (Patient taking differently: Take 4 mg by mouth every 6 (six) hours as needed for nausea or vomiting. ), Disp: 20 tablet, Rfl: 0 .  sevelamer carbonate (RENVELA) 800 MG tablet, Take 1,600-3,200 mg by mouth 3 (three) times daily with meals. 1623m with snacks 32029mwith meals, Disp: , Rfl:  .  vitamin E (VITAMIN E) 200 UNIT capsule, Take 200 Units by mouth daily., Disp: , Rfl:  .  albuterol (PROVENTIL) (2.5 MG/3ML) 0.083% nebulizer solution, Take 3 mLs (2.5 mg total) by nebulization every 4 (four) hours as needed for wheezing or shortness of breath., Disp: 75 mL, Rfl: 2 .  doxercalciferol (HECTOROL) 4 MCG/2ML injection, Doxercalciferol (Hectorol), Disp: , Rfl:    Allergies  Allergen Reactions  . Carnosine     Other reaction(s): Unknown  . Gadolinium Derivatives Hives and Other (See Comments)    HIVES, Desc: HIVES W/ "DYE" USED FOR 1ST CT SCAN BUT NOT 2ND, NO PREMEDS USED, PT UNCERTAIN OF CIRCUMSTANCES,,?POSSIBLE MRI CONTRAST ALLERGY, ALL STUDIES DONE "SOMEWHERE" IN [place]//A.C., Onset Date: [id number]. Iohexol Other (See Comments)     Code: HIVES, Desc: HIVES W/ "DYE" USED FOR 1ST CT SCAN BUT NOT 2ND, NO PREMEDS USED, PT UNCERTAIN OF CIRCUMSTANCES,,?POSSIBLE MRI CONTRAST ALLERGY, ALL STUDIES DONE "SOMEWHERE" IN [place]//A.C., Onset Date: [id number] . Iodine Hives  . Naltrexone     Other reaction(s):  Unknown     Review of Systems  Constitutional: Negative.  Negative for fatigue and malaise/fatigue.  Eyes: Negative for visual disturbance.  Respiratory: Negative.  Negative for shortness of breath.   Cardiovascular: Negative.  Negative for chest pain, palpitations and leg swelling.  Gastrointestinal: Negative.   Endocrine: Negative.   Musculoskeletal: Negative.   Skin: Negative.   Neurological: Negative for dizziness, weakness and headaches.  Psychiatric/Behavioral: Negative for confusion. The patient is not nervous/anxious.      Today's Vitals   11/07/20 1029  BP: 124/80  Pulse: 80  Temp: 98.4 F (36.9 C)  Weight: 197 lb 9.6 oz (89.6 kg)  Height: 5' 1.8" (1.57 m)  PainSc: 9   PainLoc: Wrist   Body mass index is 36.38 kg/m.   Objective:  Physical Exam Constitutional:      General: She is not in acute distress.    Appearance: Normal appearance. She is obese.  Cardiovascular:     Pulses: Normal pulses.     Heart sounds: Normal heart sounds. No murmur heard.   Pulmonary:     Effort: Pulmonary effort is normal. No respiratory distress.     Breath sounds: Normal breath sounds.  Skin:    General: Skin is warm and dry.     Capillary Refill: Capillary refill takes less than 2 seconds.  Neurological:     General: No focal deficit present.     Mental Status: She is alert and oriented to person, place, and time.     Cranial Nerves: No cranial nerve deficit.  Psychiatric:        Mood and Affect: Mood normal.        Behavior: Behavior normal.        Thought Content: Thought content normal.        Judgment: Judgment normal.         Assessment And Plan:     1. Hyperkalemia - was 7 while in the hospital has improved after having dialysis -TCM Performed. A member of the clinical team spoke with the patient upon dischare. Discharge summary was reviewed in full detail during the visit. Meds reconciled and compared to discharge meds. Medication list is updated and reviewed  with the patient.  Greater than 50% face to face time was spent in counseling an coordination of care.  All questions were answered to the satisfaction of the patient.   - will recheck levels today for potassium - BMP8+eGFR  2. Tobacco abuse  - CT CHEST LUNG CA SCREEN LOW DOSE W/O CM; Future  3. History of COPD  4. Vitamin D deficiency  Will check vitamin D level and supplement as needed.     Also encouraged to spend 15 minutes in the sun daily.   She has vitamin d added to her dialysis treatments - VITAMIN D 25 Hydroxy (Vit-D Deficiency, Fractures)     Patient was given opportunity to ask questions. Patient verbalized understanding of the plan and was able to repeat key elements of the plan. All questions were answered to their satisfaction.  Whitney Brine, FNP   I, Whitney Brine, FNP, have reviewed all documentation for this visit. The documentation on 12/20/20 for the exam, diagnosis, procedures, and orders are all accurate and complete.  THE PATIENT IS ENCOURAGED TO PRACTICE SOCIAL DISTANCING DUE TO THE COVID-19 PANDEMIC.

## 2020-11-08 ENCOUNTER — Ambulatory Visit: Payer: Medicare Other

## 2020-11-08 DIAGNOSIS — D631 Anemia in chronic kidney disease: Secondary | ICD-10-CM | POA: Diagnosis not present

## 2020-11-08 DIAGNOSIS — Z992 Dependence on renal dialysis: Secondary | ICD-10-CM | POA: Diagnosis not present

## 2020-11-08 DIAGNOSIS — D689 Coagulation defect, unspecified: Secondary | ICD-10-CM | POA: Diagnosis not present

## 2020-11-08 DIAGNOSIS — N186 End stage renal disease: Secondary | ICD-10-CM | POA: Diagnosis not present

## 2020-11-08 DIAGNOSIS — E875 Hyperkalemia: Secondary | ICD-10-CM

## 2020-11-08 DIAGNOSIS — N2581 Secondary hyperparathyroidism of renal origin: Secondary | ICD-10-CM | POA: Diagnosis not present

## 2020-11-08 LAB — BASIC METABOLIC PANEL: Potassium: 4.9 (ref 3.4–5.3)

## 2020-11-08 NOTE — Chronic Care Management (AMB) (Signed)
  Chronic Care Management    Social Work Follow Up Note  11/08/2020 Name: Whitney Walker MRN: [id number] DOB: 08/07/1956  Whitney Walker is a 64 y.o. year old female who is a primary care patient of [name], [place]. The CCM team was consulted for assistance with care coordination.   Review of patient status, including review of consultants reports, other relevant assessments, and collaboration with appropriate care team members and the patient's provider was performed as part of comprehensive patient evaluation and provision of chronic care management services.    SW placed a successful outbound call to the patient to assess for care coordination needs since recent hospitalization. The patient denies acute needs at this time.     Outpatient Encounter Medications as of 11/08/2020  Medication Sig Note  . acetaminophen (TYLENOL) 500 MG tablet Take 1,000 mg by mouth 2 (two) times daily as needed for moderate pain or headache.   . albuterol (PROVENTIL) (2.5 MG/3ML) 0.083% nebulizer solution Take 3 mLs (2.5 mg total) by nebulization every 4 (four) hours as needed for wheezing or shortness of breath.   [name] albuterol (VENTOLIN HFA) 108 (90 Base) MCG/ACT inhaler Inhale 2 puffs into the lungs every 6 (six) hours as needed for wheezing or shortness of breath.   [name] amLODipine (NORVASC) 5 MG tablet Take 1 tablet (5 mg total) by mouth daily.   [name] apixaban (ELIQUIS) 2.5 MG TABS tablet Take 1 tablet (2.5 mg total) by mouth 2 (two) times daily.   . Ascorbic Acid (VITAMIN C) 100 MG tablet Take 100 mg by mouth daily.   Lorin Picket 1 GM 210 MG(Fe) tablet Take 2-4 mg by mouth See admin instructions. 4gm with meal 2gm with snack 10/30/2020: Has not started, starting when she leaves hospital to get prescription  . Biotin 10000 MCG TABS Take 10,000 mcg by mouth daily.    . carvedilol (COREG) 12.5 MG tablet Take 12.5 mg by mouth 2 (two) times daily with a meal.    . meclizine (ANTIVERT) 12.5 MG tablet Take 1  tablet (12.5 mg total) by mouth 3 (three) times daily as needed for dizziness.   . multivitamin (RENA-VIT) TABS tablet Take 1 tablet by mouth at bedtime.   . mupirocin ointment (BACTROBAN) 2 % Apply 1 application topically daily as needed (skin bumps).   . nystatin (NYSTATIN) powder Apply 1 application topically 4 (four) times daily as needed. (Patient taking differently: Apply 1 application topically 4 (four) times daily as needed (irriation). )   . ondansetron (ZOFRAN ODT) 4 MG disintegrating tablet Take 1 tablet (4 mg total) by mouth every 6 (six) hours as needed. (Patient taking differently: Take 4 mg by mouth every 6 (six) hours as needed for nausea or vomiting. )   . sevelamer carbonate (RENVELA) 800 MG tablet Take 1,600-3,200 mg by mouth 3 (three) times daily with meals. 1600mg  with snacks 3200mg  with meals 10/30/2020: DC as of 10/30/2020 & start ayruxia  . vitamin E (VITAMIN E) 200 UNIT capsule Take 200 Units by mouth daily.    No facility-administered encounter medications on file as of 11/08/2020.     Follow Up Plan: Next care management follow up call scheduled for 11/15 with RN Care Manager.   [name], BSW, CDP Social Worker, Certified Dementia Practitioner [place] [facility] [phone]

## 2020-11-09 ENCOUNTER — Ambulatory Visit: Payer: Self-pay | Admitting: Student

## 2020-11-09 ENCOUNTER — Ambulatory Visit: Payer: Medicare Other | Admitting: Nurse Practitioner

## 2020-11-10 DIAGNOSIS — N2581 Secondary hyperparathyroidism of renal origin: Secondary | ICD-10-CM | POA: Diagnosis not present

## 2020-11-10 DIAGNOSIS — D631 Anemia in chronic kidney disease: Secondary | ICD-10-CM | POA: Diagnosis not present

## 2020-11-10 DIAGNOSIS — N186 End stage renal disease: Secondary | ICD-10-CM | POA: Diagnosis not present

## 2020-11-10 DIAGNOSIS — Z992 Dependence on renal dialysis: Secondary | ICD-10-CM | POA: Diagnosis not present

## 2020-11-10 DIAGNOSIS — D689 Coagulation defect, unspecified: Secondary | ICD-10-CM | POA: Diagnosis not present

## 2020-11-12 ENCOUNTER — Other Ambulatory Visit: Payer: Self-pay | Admitting: Nurse Practitioner

## 2020-11-13 ENCOUNTER — Encounter: Payer: Self-pay | Admitting: Nurse Practitioner

## 2020-11-13 ENCOUNTER — Telehealth: Payer: Medicare Other

## 2020-11-13 ENCOUNTER — Ambulatory Visit: Payer: Self-pay

## 2020-11-13 ENCOUNTER — Other Ambulatory Visit: Payer: Self-pay

## 2020-11-13 DIAGNOSIS — N186 End stage renal disease: Secondary | ICD-10-CM

## 2020-11-13 DIAGNOSIS — E875 Hyperkalemia: Secondary | ICD-10-CM

## 2020-11-13 DIAGNOSIS — I5032 Chronic diastolic (congestive) heart failure: Secondary | ICD-10-CM

## 2020-11-13 DIAGNOSIS — Z992 Dependence on renal dialysis: Secondary | ICD-10-CM | POA: Diagnosis not present

## 2020-11-13 DIAGNOSIS — I10 Essential (primary) hypertension: Secondary | ICD-10-CM

## 2020-11-13 DIAGNOSIS — D689 Coagulation defect, unspecified: Secondary | ICD-10-CM | POA: Diagnosis not present

## 2020-11-13 DIAGNOSIS — D631 Anemia in chronic kidney disease: Secondary | ICD-10-CM | POA: Diagnosis not present

## 2020-11-13 DIAGNOSIS — N2581 Secondary hyperparathyroidism of renal origin: Secondary | ICD-10-CM | POA: Diagnosis not present

## 2020-11-13 DIAGNOSIS — E559 Vitamin D deficiency, unspecified: Secondary | ICD-10-CM

## 2020-11-14 ENCOUNTER — Encounter: Payer: Self-pay | Admitting: Student

## 2020-11-14 ENCOUNTER — Ambulatory Visit: Payer: Medicare Other | Admitting: Student

## 2020-11-14 VITALS — BP 133/75 | HR 72 | Resp 16 | Ht 61.0 in | Wt 200.0 lb

## 2020-11-14 DIAGNOSIS — I10 Essential (primary) hypertension: Secondary | ICD-10-CM

## 2020-11-14 DIAGNOSIS — I779 Disorder of arteries and arterioles, unspecified: Secondary | ICD-10-CM

## 2020-11-14 DIAGNOSIS — R011 Cardiac murmur, unspecified: Secondary | ICD-10-CM

## 2020-11-14 DIAGNOSIS — R778 Other specified abnormalities of plasma proteins: Secondary | ICD-10-CM

## 2020-11-14 DIAGNOSIS — F17210 Nicotine dependence, cigarettes, uncomplicated: Secondary | ICD-10-CM | POA: Diagnosis not present

## 2020-11-14 DIAGNOSIS — R0989 Other specified symptoms and signs involving the circulatory and respiratory systems: Secondary | ICD-10-CM | POA: Diagnosis not present

## 2020-11-14 DIAGNOSIS — I5032 Chronic diastolic (congestive) heart failure: Secondary | ICD-10-CM | POA: Diagnosis not present

## 2020-11-14 DIAGNOSIS — R0789 Other chest pain: Secondary | ICD-10-CM

## 2020-11-14 NOTE — Progress Notes (Signed)
Primary Physician/Referring:  [name], FNP  Patient ID: Kyra Leyland, female    DOB: [birth year], 64 y.o.   MRN: [id number]  Chief Complaint  Patient presents with  . Hospitalization Follow-up  . hyperkalemia   HPI:    Daniqua Campoy  is a 64 y.o. African-American female with history of obesity, hypertension, hyperlipidemia, tobacco use, stage IV kidney disease on dialysis, obstructive sleep apnea not on CPAP, COPD and severe peripheral arterial disease with history of left femoral-popliteal bypass surgery by Dr. Victorino Dike in 2014.  Patient is on chronic anticoagulation with Eliquis due to recurrent DVT and PE in 2014 and 2016.  Patient is followed by hematology for hypercoagulable state.  She is currently undergoing hemodialysis on a Monday/Wednesday/Friday schedule.  Patient was last seen in our office 12/03/2018 for follow-up of chest pain and shortness of breath which had significantly improved at that time and she was decided stable for as needed follow-up with [place].  Patient now presents for hospital follow-up.  Patient was recently admitted to [facility] as she reported to the emergency department with chest pain and shortness of breath.  Patient was determined to be volume overloaded and underwent urgent hemodialysis with significant improvement of symptoms.  Of note patient troponin was elevated trending from 31 to as high as 477.  EKG in the hospital did not reveal signs of ischemia and Dr. Einar Gip suspected type II MI secondary to underlying congestive heart failure.  Today patient states she continues to have occasional episodes of nonexertional left-sided chest pain lasting less than 1 minute which she describes as sharp and worse with inspiration.  She also has noted intermittent bilateral arm pain, which is not associated temporally with chest pain.  She denies palpitations, orthopnea, PND, leg swelling.  Patient does have chronic dyspnea, which she reports is  stable and unchanged.  She denies symptoms of claudication.  Of note patient does state that prior to her hospitalization she had "shorten dialysis", stating she was not actively undergoing hemodialysis for her entire appointment time.  It was suspected in the hospital this likely contributed to volume overload.  Past Medical History:  Diagnosis Date  . Anemia   . Anxiety   . CHF (congestive heart failure) ([place])   . Colon cancer [facility])    treatment surgery  . Complication of anesthesia    after first C- [place] "couldnt walk after", patient denies having a spinal  . COPD (chronic obstructive pulmonary disease) ([place])   . Coronary artery disease   . Depression   . DVT (deep venous thrombosis) ([place])   . ESRD (end stage renal disease) ([place])    Hemo: MWF  . History of blood transfusion 04/2018  . Hypertension    07/07/18- no longer takes BP medications  . Meningitis   . Pain in limb 07/30/2013  . PE (pulmonary embolism)   . Peripheral vascular disease ([place])   . Restless legs   . Shortness of breath    with exertion  . Sleep apnea   . SOB (shortness of breath) 03/03/2019  . Vertigo    Past Surgical History:  Procedure Laterality Date  . ABDOMINAL AORTAGRAM N/A 07/26/2013   Procedure: ABDOMINAL Maxcine Ham;  Surgeon: [name] Goff, MD;  Location: [facility] CATH LAB;  Service: Cardiovascular;  Laterality: N/A;  . ABDOMINAL HYSTERECTOMY    . AV FISTULA PLACEMENT Left 05/05/2018   Procedure: ARTERIOVENOUS (AV) FISTULA CREATION BRACHIOCEPHALIC;  Surgeon: Waynetta Sandy, MD;  Location: [facility]  OR;  Service: Vascular;  Laterality: Left;  . AV FISTULA PLACEMENT Left 07/16/2018   Procedure: ARTERIOVENOUS FISTULA CREATION;  Surgeon: Waynetta Sandy, MD;  Location: [place];  Service: Vascular;  Laterality: Left;  . BASCILIC VEIN TRANSPOSITION Left 09/03/2018   Procedure: BASILIC VEIN TRANSPOSITION SECOND STAGE;  Surgeon: Waynetta Sandy, MD;  Location: [place];  Service: Vascular;   Laterality: Left;  . BREAST BIOPSY Left   . CESAREAN SECTION     X 3 [id number]  . CHOLECYSTECTOMY    . CHOLECYSTECTOMY  1980/s  . COLECTOMY  2010  . DIVERTICULOSIS SURGERY-2002  2012  . FEMORAL-POPLITEAL BYPASS GRAFT Left 08/13/2013   Procedure: BYPASS GRAFT FEMORAL-POPLITEAL ARTERY WITH NON-REVERSED SAPHANEOUS VEIN; ULTRASOUND GUIDED;  Surgeon: Mal [name], MD;  Location: [place];  Service: Vascular;  Laterality: Left;  . INSERTION OF DIALYSIS CATHETER Right 04/30/2018   Procedure: INSERTION OF Right Internal Jugular DIALYSIS CATHETER;  Surgeon: [name], MD;  Location: [place];  Service: Vascular;  Laterality: Right;  . IR FLUORO GUIDE CV LINE RIGHT  07/24/2020  . IR REMOVAL TUN CV CATH W/O FL  07/21/2020  . IR US GUIDE VASC ACCESS RIGHT  07/24/2020  . LOWER EXTREMITY ANGIOGRAM Left 07/26/2013   Procedure: LOWER EXTREMITY ANGIOGRAM;  Surgeon: [name] Watkins, MD;  Location: [facility] CATH LAB;  Service: Cardiovascular;  Laterality: Left;   Family History  Problem Relation Age of Onset  . Cancer Mother 83       breast and bone  . Cancer Father 61       prostate  . Hypertension Sister   . Bleeding Disorder Sister   . Cancer Cousin 20       breast cancer   . Hypertension Daughter   . Breast cancer Neg Hx     Social History   Tobacco Use  . Smoking status: Current Some Day Smoker    Packs/day: 0.50    Years: 50.00    Pack years: 25.00    Types: Cigarettes  . Smokeless tobacco: Former Systems developer  . Tobacco comment: has an occasional cigarette,   Substance Use Topics  . Alcohol use: No    Alcohol/week: 0.0 standard drinks    Comment: she used to drink alcohol, quit in 2010    Marital Status: Single   ROS  Review of Systems  Constitutional: Negative for malaise/fatigue and weight gain.  Cardiovascular: Positive for chest pain and dyspnea on exertion (Chronic, stable). Negative for claudication, leg swelling, near-syncope, orthopnea, palpitations, paroxysmal nocturnal dyspnea and  syncope.  Hematologic/Lymphatic: Does not bruise/bleed easily.  Gastrointestinal: Negative for melena.  Neurological: Negative for dizziness and weakness.    Objective  Blood pressure 133/75, pulse 72, resp. rate 16, height 5\' 1"  (1.549 m), weight 200 lb (90.7 kg), SpO2 93 %.  Vitals with BMI 11/14/2020 11/07/2020 11/01/2020  Height 5\' 1"  5' 1.8" -  Weight 200 lbs 197 lbs 10 oz -  BMI 66.44 03.47 -  Systolic 425 956 387  Diastolic 75 80 88  Pulse 72 80 101      Physical Exam Vitals reviewed.  Constitutional:      Comments: Tunneled dialysis catheter in place  HENT:     Head: Normocephalic and atraumatic.  Cardiovascular:     Rate and Rhythm: Normal rate and regular rhythm.     Pulses: Intact distal pulses.          Carotid pulses are 2+ on the right side and 2+ on the  left side with bruit.      Radial pulses are 2+ on the right side and 2+ on the left side.       Femoral pulses are 2+ on the right side and 2+ on the left side.      Popliteal pulses are 2+ on the right side and 0 on the left side.       Dorsalis pedis pulses are 1+ on the right side and 0 on the left side.       Posterior tibial pulses are 2+ on the right side and 1+ on the left side.     Heart sounds: S1 normal and S2 normal. Murmur heard.  Systolic murmur is present at the upper right sternal border and upper left sternal border.  No gallop.   Pulmonary:     Effort: Pulmonary effort is normal. No respiratory distress.     Breath sounds: No wheezing, rhonchi or rales.  Musculoskeletal:     Right lower leg: No edema.     Left lower leg: No edema.  Skin:    General: Skin is warm.     Capillary Refill: Capillary refill takes less than 2 seconds.     Coloration: Skin is not pale.     Findings: No erythema.  Neurological:     Mental Status: She is alert.    Laboratory examination:   Recent Labs    05/09/20 1246 05/09/20 1246 05/10/20 0631 05/10/20 0631 05/18/20 1036 10/18/20 0000 10/30/20 0407  10/30/20 0603 10/31/20 0027 11/01/20 0035 11/08/20 0000  NA 136   < > 135   < > 133*  --  131*  --  136 135  --   K 4.8   < > 5.1   < > 5.1   < > >7.5*   < > 5.2* 6.3* 4.9  CL 96*   < > 97*   < > 96*  --  98  --  93* 95*  --   CO2 26   < > 25   < > 27  --  17*  --  28 25  --   GLUCOSE 71   < > 100*   < > 99  --  98  --  100* 99  --   BUN 19   < > 35*   < > 38*  --  69*  --  39* 61*  --   CREATININE 5.80*   < > 8.44*   < > 6.98*  --  12.21*  --  8.04* 10.69*  --   CALCIUM 9.1   < > 9.1   < > 9.9  --  9.8  --  8.9 9.1  --   GFRNONAA 7*   < > 5*   < > 6*  --  3*  --  5* 4*  --   GFRAA 8*  --  5*  --  7*  --   --   --   --   --   --    < > = values in this interval not displayed.   estimated creatinine clearance is 5.4 mL/min (A) (by C-G formula based on SCr of 10.69 mg/dL (H)).  CMP Latest Ref Rng & Units 11/08/2020 11/01/2020 10/31/2020  Glucose 70 - 99 mg/dL - 99 100(H)  BUN 8 - 23 mg/dL - 61(H) 39(H)  Creatinine 0.44 - 1.00 mg/dL - 10.69(H) 8.04(H)  Sodium 135 - 145 mmol/L - 135 136  Potassium 3.4 -  5.3 4.9 6.3(HH) 5.2(H)  Chloride 98 - 111 mmol/L - 95(L) 93(L)  CO2 22 - 32 mmol/L - 25 28  Calcium 8.9 - 10.3 mg/dL - 9.1 8.9  Total Protein 6.5 - 8.1 g/dL - - -  Total Bilirubin 0.3 - 1.2 mg/dL - - -  Alkaline Phos 38 - 126 U/L - - -  AST 15 - 41 U/L - - -  ALT 0 - 44 U/L - - -   CBC Latest Ref Rng & Units 11/01/2020 10/31/2020 10/30/2020  WBC 4.0 - 10.5 K/uL 11.2(H) 11.4(H) 12.8(H)  Hemoglobin 12.0 - 15.0 g/dL 9.8(L) 10.2(L) 11.1(L)  Hematocrit 36 - 46 % 31.0(L) 33.0(L) 35.7(L)  Platelets 150 - 400 K/uL 161 148(L) 202    Lipid Panel No results for input(s): CHOL, TRIG, LDLCALC, VLDL, HDL, CHOLHDL, LDLDIRECT in the last 8760 hours.  HEMOGLOBIN A1C Lab Results  Component Value Date   HGBA1C 5.1 06/04/2018   MPG 140 09/07/2009   TSH No results for input(s): TSH in the last 8760 hours.  External labs:   06/04/2018: HDL 63, LDL 97, total cholesterol 191, triglycerides  155  Medications and allergies   Allergies  Allergen Reactions  . Carnosine     Other reaction(s): Unknown  . Gadolinium Derivatives Hives and Other (See Comments)    HIVES, Desc: HIVES W/ "DYE" USED FOR 1ST CT SCAN BUT NOT 2ND, NO PREMEDS USED, PT UNCERTAIN OF CIRCUMSTANCES,,?POSSIBLE MRI CONTRAST ALLERGY, ALL STUDIES DONE "SOMEWHERE" IN [place]//A.C., Onset Date: 52778242  . Iohexol Other (See Comments)     Code: HIVES, Desc: HIVES W/ "DYE" USED FOR 1ST CT SCAN BUT NOT 2ND, NO PREMEDS USED, PT UNCERTAIN OF CIRCUMSTANCES,,?POSSIBLE MRI CONTRAST ALLERGY, ALL STUDIES DONE "SOMEWHERE" IN [place]//A.C., Onset Date: [id number]   . Iodine Hives  . Naltrexone     Other reaction(s): Unknown     Outpatient Medications Prior to Visit  Medication Sig Dispense Refill  . acetaminophen (TYLENOL) 500 MG tablet Take 1,000 mg by mouth 2 (two) times daily as needed for moderate pain or headache.    . albuterol (PROVENTIL) (2.5 MG/3ML) 0.083% nebulizer solution Take 3 mLs (2.5 mg total) by nebulization every 4 (four) hours as needed for wheezing or shortness of breath. 75 mL 2  . amLODipine (NORVASC) 5 MG tablet Take 1 tablet (5 mg total) by mouth daily. 90 tablet 1  . apixaban (ELIQUIS) 2.5 MG TABS tablet Take 1 tablet (2.5 mg total) by mouth 2 (two) times daily. 180 tablet 3  . Ascorbic Acid (VITAMIN C) 100 MG tablet Take 100 mg by mouth daily.    Lorin Picket 1 GM 210 MG(Fe) tablet Take 2-4 mg by mouth See admin instructions. 4gm with meal 2gm with snack    . Biotin 10000 MCG TABS Take 10,000 mcg by mouth daily.     . carvedilol (COREG) 12.5 MG tablet Take 12.5 mg by mouth 2 (two) times daily with a meal.     . doxercalciferol (HECTOROL) 4 MCG/2ML injection Doxercalciferol (Hectorol)    . meclizine (ANTIVERT) 12.5 MG tablet Take 1 tablet (12.5 mg total) by mouth 3 (three) times daily as needed for dizziness. 30 tablet 0  . multivitamin (RENA-VIT) TABS tablet Take 1 tablet by mouth at bedtime.     . mupirocin ointment (BACTROBAN) 2 % Apply 1 application topically daily as needed (skin bumps). 22 g 2  . nystatin (NYSTATIN) powder Apply 1 application topically 4 (four) times daily as needed. (Patient taking differently: Apply 1  application topically 4 (four) times daily as needed (irriation). ) 30 g 1  . ondansetron (ZOFRAN ODT) 4 MG disintegrating tablet Take 1 tablet (4 mg total) by mouth every 6 (six) hours as needed. (Patient taking differently: Take 4 mg by mouth every 6 (six) hours as needed for nausea or vomiting. ) 20 tablet 0  . sevelamer carbonate (RENVELA) 800 MG tablet Take 1,600-3,200 mg by mouth 3 (three) times daily with meals. 1600mg  with snacks 3200mg  with meals    . vitamin E (VITAMIN E) 200 UNIT capsule Take 200 Units by mouth daily.    [name] albuterol (VENTOLIN HFA) 108 (90 Base) MCG/ACT inhaler Inhale 2 puffs into the lungs every 6 (six) hours as needed for wheezing or shortness of breath. 18 g 0   No facility-administered medications prior to visit.     Radiology:   No results found.  Cardiac Studies:   Echocardiogram 03/26/2017: Left ventricular cavity is normal in size.  Moderate to severe concentric hypertrophy of the left ventricle.  Normal global wall motion with visual EF 60-65%.  Doppler evidence of grade 1 diastolic dysfunction with elevated LV filling pressure. Trace mitral regurgitation.  Mild calcification of the mitral valve annulus. Mild tricuspid regurgitation.  PA systolic pressures.  Normal, although difficult to calculate due to poor TR signal. Small circumferential pericardial effusion Compared to echo 09/07/2015, pericardial effusion has slightly increased, although still small.  No other diagnostic change.   Nuclear stress test Lexiscan Myoview 08/05/2013: Resting EKG NSR, poor R wave progression.  Stress EKG was nondiagnostic for ischemia.  No ST-T changes of ischemia noted with pharmacologic stress testing.  Stress symptoms included shortness of  breath and nausea.  Stress terminated due to completion of protocol. The perfusion study demonstrated normal isotope uptake both at rest and stress.  There was no evidence of ischemia or scar.  Dynamic gated images revealed normal wall motion and endocardial thickening.  Left ventricular ejection fraction estimated to be 75%.  No significant change from 01/03/2012.    CT neck 10/31/2011: Normal soft tissues.  Mild pulmonary artery dilation suggest hypertension.  Sleep study 2007: Sleep apnea, noncompliant with CPAP uses supplemental oxygen only.  EKG:   11/14/2020: Sinus rhythm at a rate of 82 bpm with first-degree AV block, biatrial enlargement.  Left axis deviation, left anterior fascicular block.  Poor R wave progression, cannot exclude anteroseptal infarct old.  Cannot exclude inferior ischemia.  Incomplete right bundle branch block.  Compared to EKG 10/30/2020 (external), no significant change.  12/03/2018: Sinus tachycardia at 113 beats per minute with first-degree AV block.  Normal axis.  PR WP cannot exclude anterior infarct old.  No evidence of ischemia.  Except for tachycardia no changes noted to EKG.  Assessment     ICD-10-CM   1. PAOD (peripheral arterial occlusive disease) (HCC)  I77.9 EKG 12-Lead  2. Chronic diastolic heart failure (HCC)  I50.32 PCV ECHOCARDIOGRAM COMPLETE    PCV MYOCARDIAL PERFUSION WO LEXISCAN    Lipid Panel With LDL/HDL Ratio    Brain natriuretic peptide  3. Essential hypertension  I10 PCV ECHOCARDIOGRAM COMPLETE    PCV MYOCARDIAL PERFUSION WO LEXISCAN    Lipid Panel With LDL/HDL Ratio  4. Bruit of left carotid artery  R09.89 PCV CAROTID DUPLEX (BILATERAL)  5. Heart murmur, systolic  I14.4 PCV ECHOCARDIOGRAM COMPLETE  6. Atypical chest pain  R07.89 PCV MYOCARDIAL PERFUSION WO LEXISCAN  7. Elevated troponin  R77.8 PCV MYOCARDIAL PERFUSION WO LEXISCAN     There are  no discontinued medications.  No orders of the defined types were placed in this  encounter.   Recommendations:   Tahtiana Rozier is a 64 y.o. African-American female with history of obesity, hypertension, hyperlipidemia, tobacco use, stage IV kidney disease on dialysis, obstructive sleep apnea not on CPAP, COPD and severe peripheral arterial disease with history of left femoral-popliteal bypass surgery by Dr. Victorino Dike in 2014.  Patient is on chronic anticoagulation with Eliquis due to recurrent DVT and PE in 2014 and 2016.  Patient is followed by hematology for hypercoagulable state.  She is currently undergoing hemodialysis on a Monday/Wednesday/Friday schedule.  Patient presents for hospital follow-up with primary concerns of chest pain as well as elevated troponin suspicious for type II MI due to underlying congestive heart failure.  Patient continues to have frequent episodes of chest pain with both typical and atypical characteristics.  She does have multiple cardiovascular risk factors as well as history of severe peripheral arterial disease.  In view of symptoms as well as elevated troponin and BNP in the hospital recommend further evaluation of heart structure and function as well as ischemic work-up.  Will obtain echocardiogram and nuclear stress test.  On exam new left carotid bruit noted, will obtain bilateral carotid artery duplex.  We will also repeat BNP in order to trend from hospital stay.  On exam patient does have reduced distal pulses bilateral lower extremities, worse on the left. However, she is without symptoms of claudication and there are no signs of critical limb ischemia. Therefore, do not feel ABI is warranted at this time, but could consider in the future.   I personally reviewed external labs, there is no recent lipid profile testing for review.  Will obtain lipid panel in order to optimize modifiable cardiovascular risk factors.  Also discussed with patient regarding importance of CPAP use.  [name] anticoagulation with Eliquis is currently being  managed by hematology, will defer further management to them.  Blood pressure is well controlled.  Counseled patient regarding smoking cessation.  She does not wish for me to prescribe nicotine replacement therapy at this time.  Follow-up in 8 weeks for symptom recheck and results of cardiac testing.  During this visit I reviewed and updated: Tobacco history  allergies medication reconciliation  medical history  surgical history  family history  social history.  This note was created using a voice recognition software as a result there may be grammatical errors inadvertently enclosed that do not reflect the nature of this encounter. Every attempt is made to correct such errors.   Alethia Berthold, PA-C 11/14/2020, 5:36 PM Office: [phone]

## 2020-11-15 DIAGNOSIS — Z992 Dependence on renal dialysis: Secondary | ICD-10-CM | POA: Diagnosis not present

## 2020-11-15 DIAGNOSIS — N2581 Secondary hyperparathyroidism of renal origin: Secondary | ICD-10-CM | POA: Diagnosis not present

## 2020-11-15 DIAGNOSIS — N186 End stage renal disease: Secondary | ICD-10-CM | POA: Diagnosis not present

## 2020-11-15 DIAGNOSIS — D689 Coagulation defect, unspecified: Secondary | ICD-10-CM | POA: Diagnosis not present

## 2020-11-15 DIAGNOSIS — D631 Anemia in chronic kidney disease: Secondary | ICD-10-CM | POA: Diagnosis not present

## 2020-11-15 LAB — CBC AND DIFFERENTIAL: Hemoglobin: 32.4 — AB (ref 12.0–16.0)

## 2020-11-15 LAB — IRON,TIBC AND FERRITIN PANEL: Iron: 52

## 2020-11-16 ENCOUNTER — Ambulatory Visit: Payer: Medicare Other

## 2020-11-16 ENCOUNTER — Other Ambulatory Visit: Payer: Self-pay

## 2020-11-16 DIAGNOSIS — I5032 Chronic diastolic (congestive) heart failure: Secondary | ICD-10-CM | POA: Diagnosis not present

## 2020-11-16 DIAGNOSIS — R011 Cardiac murmur, unspecified: Secondary | ICD-10-CM

## 2020-11-16 DIAGNOSIS — I10 Essential (primary) hypertension: Secondary | ICD-10-CM | POA: Diagnosis not present

## 2020-11-16 DIAGNOSIS — R0989 Other specified symptoms and signs involving the circulatory and respiratory systems: Secondary | ICD-10-CM | POA: Diagnosis not present

## 2020-11-16 NOTE — Chronic Care Management (AMB) (Signed)
Chronic Care Management   Follow Up Note  11/13/2020  Name: Whitney Walker MRN: [id number] DOB: [birth year]  Referred by: [name], FNP Reason for referral : Chronic Care Management (CCM RNCM FU Call )   Whitney Walker is a 64 y.o. year old female who is a primary care patient of [name], [place]. The CCM team was consulted for assistance with chronic disease management and care coordination needs.    Review of patient status, including review of consultants reports, relevant laboratory and other test results, and collaboration with appropriate care team members and the patient's provider was performed as part of comprehensive patient evaluation and provision of chronic care management services.    SDOH (Social Determinants of Health) assessments performed: Yes - no acute challenges identified  See Care Plan activities for detailed interventions related to [place])   Completed CCM RN CM outbound follow up call to patient.     Outpatient Encounter Medications as of 11/13/2020  Medication Sig Note  . acetaminophen (TYLENOL) 500 MG tablet Take 1,000 mg by mouth 2 (two) times daily as needed for moderate pain or headache.   . albuterol (PROVENTIL) (2.5 MG/3ML) 0.083% nebulizer solution Take 3 mLs (2.5 mg total) by nebulization every 4 (four) hours as needed for wheezing or shortness of breath.   [name] albuterol (VENTOLIN HFA) 108 (90 Base) MCG/ACT inhaler Inhale 2 puffs into the lungs every 6 (six) hours as needed for wheezing or shortness of breath.   [name] amLODipine (NORVASC) 5 MG tablet Take 1 tablet (5 mg total) by mouth daily.   [name] apixaban (ELIQUIS) 2.5 MG TABS tablet Take 1 tablet (2.5 mg total) by mouth 2 (two) times daily.   . Ascorbic Acid (VITAMIN C) 100 MG tablet Take 100 mg by mouth daily.   Lorin Picket 1 GM 210 MG(Fe) tablet Take 2-4 mg by mouth See admin instructions. 4gm with meal 2gm with snack 10/30/2020: Has not started, starting when she leaves hospital to get  prescription  . Biotin 10000 MCG TABS Take 10,000 mcg by mouth daily.    . carvedilol (COREG) 12.5 MG tablet Take 12.5 mg by mouth 2 (two) times daily with a meal.    . meclizine (ANTIVERT) 12.5 MG tablet Take 1 tablet (12.5 mg total) by mouth 3 (three) times daily as needed for dizziness.   . multivitamin (RENA-VIT) TABS tablet Take 1 tablet by mouth at bedtime.   . mupirocin ointment (BACTROBAN) 2 % Apply 1 application topically daily as needed (skin bumps).   . nystatin (NYSTATIN) powder Apply 1 application topically 4 (four) times daily as needed. (Patient taking differently: Apply 1 application topically 4 (four) times daily as needed (irriation). )   . ondansetron (ZOFRAN ODT) 4 MG disintegrating tablet Take 1 tablet (4 mg total) by mouth every 6 (six) hours as needed. (Patient taking differently: Take 4 mg by mouth every 6 (six) hours as needed for nausea or vomiting. )   . sevelamer carbonate (RENVELA) 800 MG tablet Take 1,600-3,200 mg by mouth 3 (three) times daily with meals. 1600mg  with snacks 3200mg  with meals 10/30/2020: DC as of 10/30/2020 & start ayruxia  . vitamin E (VITAMIN E) 200 UNIT capsule Take 200 Units by mouth daily.    No facility-administered encounter medications on file as of 11/13/2020.     Objective:  Lab Results  Component Value Date   HGBA1C 5.1 06/04/2018   HGBA1C (H) 09/07/2009    6.5 (NOTE) The ADA recommends the following therapeutic  goal for glycemic control related to Hgb A1c measurement: Goal of therapy: <6.5 Hgb A1c  Reference: American Diabetes Association: Clinical Practice Recommendations 2010, Diabetes Care, 2010, 33: (Suppl  1).   Lab Results  Component Value Date   LDLCALC 97 06/04/2018   CREATININE 10.69 (H) 11/01/2020   BP Readings from Last 3 Encounters:  11/14/20 133/75  11/07/20 124/80  11/01/20 137/88    Goals Addressed      Patient Stated   .  "to keep my potassium from getting too high" (pt-stated)        [place] (see longitudinal plan of care for additional care plan information)  Current Barriers:  [name] Knowledge Deficits related to how to prevent Hyperkalemia with ESRD  . Chronic Disease Management support and education needs related to ESRD, HTN, CHF, Vitamin D deficiency, Hyperkalemia   Nurse Case Manager Clinical Goal(s):  [name] Over the next 90 days, patient will work with the RN CM  to address needs related to knowledge deficit related to dietary recommendations with ESRD to help manage Potassium  CCM RN CM Interventions:  11/13/20 call completed with patient  . Inter-disciplinary care team collaboration (see longitudinal plan of care) . Evaluation of current treatment plan related to Hyperkalemia and patient's adherence to plan as established by provider . Determined patient experienced Hyperkalemia requiring hospitalization from 10/30/20-11/01/20; patient was unaware she had eaten foods containing potassium and reports she had eaten small amounts of several types of foods high in potassium and was unaware this could cause her harm  . Provided education to patient with rationale re: the importance to avoid eating any foods that contain potassium due to having ESRD  . Encouraged patient to discuss this further with her registered dietician at the kidney center where she receives dialysis treatments  . Discussed plans with patient for ongoing care management follow up and provided patient with direct contact information for care management team . Mailed printed educational material, "Potassium and Kidney Disease"  Patient Self Care Activities:  . Self administers medications as prescribed . Attends all scheduled provider appointments . Calls pharmacy for medication refills . Calls provider office for new concerns or questions  Initial goal documentation       Plan:   Telephone follow up appointment with care management team member scheduled for: 01/10/21  [name], RN, BSN, CCM Care  Management Coordinator [place] [facility]  Direct Phone: [phone]

## 2020-11-16 NOTE — Patient Instructions (Signed)
Visit Information  Goals Addressed      Patient Stated   .  "to keep my potassium from getting too high" (pt-stated)        [place] (see longitudinal plan of care for additional care plan information)  Current Barriers:  [name] Knowledge Deficits related to how to prevent Hyperkalemia with ESRD  . Chronic Disease Management support and education needs related to ESRD, HTN, CHF, Vitamin D deficiency, Hyperkalemia   Nurse Case Manager Clinical Goal(s):  [name] Over the next 90 days, patient will work with the RN CM  to address needs related to knowledge deficit related to dietary recommendations with ESRD to help manage Potassium  CCM RN CM Interventions:  11/13/20 call completed with patient  . Inter-disciplinary care team collaboration (see longitudinal plan of care) . Evaluation of current treatment plan related to Hyperkalemia and patient's adherence to plan as established by provider . Determined patient experienced Hyperkalemia requiring hospitalization from 10/30/20-11/01/20; patient was unaware she had eaten foods containing potassium and reports she had eaten small amounts of several types of foods high in potassium and was unaware this could cause her harm  . Provided education to patient with rationale re: the importance to avoid eating any foods that contain potassium due to having ESRD  . Encouraged patient to discuss this further with her registered dietician at the kidney center where she receives dialysis treatments  . Discussed plans with patient for ongoing care management follow up and provided patient with direct contact information for care management team . Mailed printed educational material, "Potassium and Kidney Disease"  Patient Self Care Activities:  . Self administers medications as prescribed . Attends all scheduled provider appointments . Calls pharmacy for medication refills . Calls provider office for new concerns or questions  Initial goal documentation        The patient verbalized understanding of instructions, educational materials, and care plan provided today and declined offer to receive copy of patient instructions, educational materials, and care plan.   Telephone follow up appointment with care management team member scheduled for: 01/10/21  [name], RN, BSN, CCM Care Management Coordinator [place] [facility]  Direct Phone: [phone]

## 2020-11-17 ENCOUNTER — Other Ambulatory Visit: Payer: Self-pay | Admitting: Cardiology

## 2020-11-17 DIAGNOSIS — Z992 Dependence on renal dialysis: Secondary | ICD-10-CM | POA: Diagnosis not present

## 2020-11-17 DIAGNOSIS — D689 Coagulation defect, unspecified: Secondary | ICD-10-CM | POA: Diagnosis not present

## 2020-11-17 DIAGNOSIS — I6523 Occlusion and stenosis of bilateral carotid arteries: Secondary | ICD-10-CM

## 2020-11-17 DIAGNOSIS — I6522 Occlusion and stenosis of left carotid artery: Secondary | ICD-10-CM

## 2020-11-17 DIAGNOSIS — N186 End stage renal disease: Secondary | ICD-10-CM | POA: Diagnosis not present

## 2020-11-17 DIAGNOSIS — D631 Anemia in chronic kidney disease: Secondary | ICD-10-CM | POA: Diagnosis not present

## 2020-11-17 DIAGNOSIS — N2581 Secondary hyperparathyroidism of renal origin: Secondary | ICD-10-CM | POA: Diagnosis not present

## 2020-11-19 DIAGNOSIS — N2581 Secondary hyperparathyroidism of renal origin: Secondary | ICD-10-CM | POA: Diagnosis not present

## 2020-11-19 DIAGNOSIS — Z992 Dependence on renal dialysis: Secondary | ICD-10-CM | POA: Diagnosis not present

## 2020-11-19 DIAGNOSIS — D689 Coagulation defect, unspecified: Secondary | ICD-10-CM | POA: Diagnosis not present

## 2020-11-19 DIAGNOSIS — D631 Anemia in chronic kidney disease: Secondary | ICD-10-CM | POA: Diagnosis not present

## 2020-11-19 DIAGNOSIS — N186 End stage renal disease: Secondary | ICD-10-CM | POA: Diagnosis not present

## 2020-11-20 ENCOUNTER — Other Ambulatory Visit: Payer: Self-pay

## 2020-11-20 ENCOUNTER — Ambulatory Visit: Payer: Medicare Other

## 2020-11-20 DIAGNOSIS — R7989 Other specified abnormal findings of blood chemistry: Secondary | ICD-10-CM

## 2020-11-20 DIAGNOSIS — I5032 Chronic diastolic (congestive) heart failure: Secondary | ICD-10-CM

## 2020-11-20 DIAGNOSIS — I10 Essential (primary) hypertension: Secondary | ICD-10-CM

## 2020-11-20 DIAGNOSIS — R778 Other specified abnormalities of plasma proteins: Secondary | ICD-10-CM | POA: Diagnosis not present

## 2020-11-20 DIAGNOSIS — R0789 Other chest pain: Secondary | ICD-10-CM

## 2020-11-20 NOTE — Progress Notes (Signed)
Please inform patient there is some narrowing of both right and left carotid arteries, but good blood flow. Will continue to monitor. Will discuss further at upcoming visit.

## 2020-11-20 NOTE — Progress Notes (Signed)
Called and spoke with pt regarding carotid artery duplex. AD/S

## 2020-11-20 NOTE — Progress Notes (Signed)
Spoke with patient. Patient voiced understanding.

## 2020-11-20 NOTE — Progress Notes (Signed)
Please inform patient mild mitral valve stenosis and some mildly leaky valves as well as thickened heart muscle. No significant change from previous echo in 2018. Will discuss further at upcoming visit.

## 2020-11-21 DIAGNOSIS — D689 Coagulation defect, unspecified: Secondary | ICD-10-CM | POA: Diagnosis not present

## 2020-11-21 DIAGNOSIS — D631 Anemia in chronic kidney disease: Secondary | ICD-10-CM | POA: Diagnosis not present

## 2020-11-21 DIAGNOSIS — Z992 Dependence on renal dialysis: Secondary | ICD-10-CM | POA: Diagnosis not present

## 2020-11-21 DIAGNOSIS — N186 End stage renal disease: Secondary | ICD-10-CM | POA: Diagnosis not present

## 2020-11-21 DIAGNOSIS — N2581 Secondary hyperparathyroidism of renal origin: Secondary | ICD-10-CM | POA: Diagnosis not present

## 2020-11-24 DIAGNOSIS — N2581 Secondary hyperparathyroidism of renal origin: Secondary | ICD-10-CM | POA: Diagnosis not present

## 2020-11-24 DIAGNOSIS — D689 Coagulation defect, unspecified: Secondary | ICD-10-CM | POA: Diagnosis not present

## 2020-11-24 DIAGNOSIS — N186 End stage renal disease: Secondary | ICD-10-CM | POA: Diagnosis not present

## 2020-11-24 DIAGNOSIS — Z992 Dependence on renal dialysis: Secondary | ICD-10-CM | POA: Diagnosis not present

## 2020-11-24 DIAGNOSIS — D631 Anemia in chronic kidney disease: Secondary | ICD-10-CM | POA: Diagnosis not present

## 2020-11-25 NOTE — Chronic Care Management (AMB) (Signed)
Chronic Care Management Pharmacy Assistant   Name: Whitney Walker  MRN: [id number] DOB: 1956/10/06  Reason for Encounter: Medication Review  Patient Questions:  1.  Have you seen any other providers since your last visit? Yes, 11/09/2021[name] (PCP), 11/08/2020- Tillie Rung Humble (CCM LSW), 11/13/2020- [name], RN (CCM), 11/14/2020- Lawerance Cruel, PA(Cardiology).  2.  Any changes in your medicines or health? Yes, 11/07/2020- Albuterol Sulfate changed to 108 mcg inhaler- 2 puffs every 6 hours prn and Albuterol 2.5 mg nebulizer solution added, 1 every 4 hours prn.    PCP : [name], FNP  Allergies:   Allergies  Allergen Reactions  . Carnosine     Other reaction(s): Unknown  . Gadolinium Derivatives Hives and Other (See Comments)    HIVES, Desc: HIVES W/ "DYE" USED FOR 1ST CT SCAN BUT NOT 2ND, NO PREMEDS USED, PT UNCERTAIN OF CIRCUMSTANCES,,?POSSIBLE MRI CONTRAST ALLERGY, ALL STUDIES DONE "SOMEWHERE" IN [place]//A.C., Onset Date: 60109323  . Iohexol Other (See Comments)     Code: HIVES, Desc: HIVES W/ "DYE" USED FOR 1ST CT SCAN BUT NOT 2ND, NO PREMEDS USED, PT UNCERTAIN OF CIRCUMSTANCES,,?POSSIBLE MRI CONTRAST ALLERGY, ALL STUDIES DONE "SOMEWHERE" IN [place]//A.C., Onset Date: 55732202   . Iodine Hives  . Naltrexone     Other reaction(s): Unknown    Medications: Outpatient Encounter Medications as of 11/27/2020  Medication Sig Note  . acetaminophen (TYLENOL) 500 MG tablet Take 1,000 mg by mouth 2 (two) times daily as needed for moderate pain or headache.   . albuterol (PROVENTIL) (2.5 MG/3ML) 0.083% nebulizer solution Take 3 mLs (2.5 mg total) by nebulization every 4 (four) hours as needed for wheezing or shortness of breath.   [name] albuterol (VENTOLIN HFA) 108 (90 Base) MCG/ACT inhaler Inhale 2 puffs into the lungs every 6 (six) hours as needed for wheezing or shortness of breath.   [name] amLODipine (NORVASC) 5 MG tablet Take 1 tablet (5 mg total) by mouth  daily.   [name] apixaban (ELIQUIS) 2.5 MG TABS tablet Take 1 tablet (2.5 mg total) by mouth 2 (two) times daily.   . Ascorbic Acid (VITAMIN C) 100 MG tablet Take 100 mg by mouth daily.   Lorin Picket 1 GM 210 MG(Fe) tablet Take 2-4 mg by mouth See admin instructions. 4gm with meal 2gm with snack 10/30/2020: Has not started, starting when she leaves hospital to get prescription  . Biotin 10000 MCG TABS Take 10,000 mcg by mouth daily.    . carvedilol (COREG) 12.5 MG tablet Take 12.5 mg by mouth 2 (two) times daily with a meal.    . doxercalciferol (HECTOROL) 4 MCG/2ML injection Doxercalciferol (Hectorol)   . meclizine (ANTIVERT) 12.5 MG tablet Take 1 tablet (12.5 mg total) by mouth 3 (three) times daily as needed for dizziness.   . multivitamin (RENA-VIT) TABS tablet Take 1 tablet by mouth at bedtime.   . mupirocin ointment (BACTROBAN) 2 % Apply 1 application topically daily as needed (skin bumps).   . nystatin (NYSTATIN) powder Apply 1 application topically 4 (four) times daily as needed. (Patient taking differently: Apply 1 application topically 4 (four) times daily as needed (irriation). )   . ondansetron (ZOFRAN ODT) 4 MG disintegrating tablet Take 1 tablet (4 mg total) by mouth every 6 (six) hours as needed. (Patient taking differently: Take 4 mg by mouth every 6 (six) hours as needed for nausea or vomiting. )   . sevelamer carbonate (RENVELA) 800 MG tablet Take 1,600-3,200 mg by mouth 3 (three) times  daily with meals. 1600mg  with snacks 3200mg  with meals 10/30/2020: DC as of 10/30/2020 & start ayruxia  . vitamin E (VITAMIN E) 200 UNIT capsule Take 200 Units by mouth daily.    No facility-administered encounter medications on file as of 11/27/2020.    Current Diagnosis: Patient Active Problem List   Diagnosis Date Noted  . Hyperkalemia 05/09/2020  . Acute respiratory failure with hypoxia ([place]) 05/09/2020  . Left leg pain 05/09/2020  . Restless leg syndrome 09/19/2018  . OSA (obstructive sleep  apnea) 04/29/2018  . ESRD on dialysis ([place]) 04/29/2018  . Dyspnea on exertion 03/12/2017  . Morbid (severe) obesity due to excess calories ([place]) 03/12/2017  . Anemia of chronic kidney failure, stage 3 (moderate) ([place]) 12/10/2016  . History of colon cancer 08/12/2016  . Mass of left breast on mammogram 12/10/2015  . HTN (hypertension) 10/18/2015  . Depression 10/18/2015  . Numbness and tingling of left arm and leg 01/24/2015  . PVD (peripheral vascular disease) with claudication ([place]) 03/23/2014  . PAD (peripheral artery disease) ([place]) 12/14/2013  . Chronic diastolic heart failure ([place]) 11/15/2013  . Essential hypertension 11/15/2013  . Leucocytosis 04/28/2012   Reviewed chart for medication changes ahead of medication coordination call.  OVs, Consults - 11/07/2020- [name] (PCP), 11/08/2020- Tillie Rung Humble (CCM LSW), 11/13/2020- [name], RN (CCM), 11/14/2020- Lawerance Cruel, PA(Cardiology) since last care coordination call/Pharmacist visit.   Medication changes indicated: 11/07/2020- Albuterol Sulfate changed to 108 mcg inhaler- 2 puffs every 6 hours prn and Albuterol 2.5 mg nebulizer solution added, 1 every 4 hours prn.  BP Readings from Last 3 Encounters:  11/14/20 133/75  11/07/20 124/80  11/01/20 137/88    Lab Results  Component Value Date   HGBA1C 5.1 06/04/2018     Patient obtains medications through [facility]  90 Days   Last adherence delivery included:  Albuterol inhaler 108 mcg- inhale 2 puffs every 6 hours prn.   Patient declined the following medications last month: Carvedilol 12.5 mg- 1 tablet by mouth twice a day (M/W/F dialysisdaysonce daily(EM);T/Th/Sa/S Breakfast, (EM) and  Eliquis 2.5 mg-1 tablet by mouth twice a day (Breakfast, EM) due to receiving 30 day supply on 11/02/2020.  Was using an old supply from [facility]. Amlodipine 5 mg- 1 tablet daily due to receiving a 90 day on 09/28/20. Lunesta 2 mg- 1 tablet nightly (bedtime)due to not helping with  Insomnia patient discontinued medication. Nystatin100 gm- apply four times a day due to PRN use.  Meclizine 12.5 mg- 1 tablet three times a day for dizziness due to PRN use.  Ondansetron 4 mg-  Dissolve 1 tablet under tongue every 6 hours due to PRN use.  Patient is due for next adherence delivery on: 11/29/2020 . Called patient and reviewed medications and coordinated delivery.  This delivery to include: None- No Fill Needed.  Patient will need not need a short fill prior to adherence delivery.   Acute fill not needed.  Patient declined the following medications:  Albuterol inhaler 108 mcg- inhale 2 puffs every 6 hours prn.  Carvedilol 12.5 mg- 1 tablet by mouth twice a day (M/W/F dialysisdaysonce daily(EM);T/Th/Sa/S Breakfast, (EM) and  Eliquis 2.5 mg-1 tablet by mouth twice a day (Breakfast, EM) due to receiving 30 day supply on 11/02/2020.   Amlodipine 5 mg- 1 tablet daily due to receiving a 90 day on 09/28/20. Lunesta 2 mg- 1 tablet nightly (bedtime)due to not helping with Insomnia patient discontinued medication. Nystatin100 gm- apply four times a day as needed. Meclizine 12.5  mg- 1 tablet three times a day for dizziness due to PRN use.  Ondansetron 4 mg-  Dissolve 1 tablet under tongue every 6 hours due to PRN use.  No Refills needed.  Patient aware that we will follow up with her in a few weeks to follow up on medication needs.   Follow-Up:  Coordination of Enhanced Pharmacy Services and Pharmacist Review  [name], CPP notified.  [name], [place] Pharmacist Assistant [phone]

## 2020-11-27 ENCOUNTER — Telehealth: Payer: Self-pay

## 2020-11-27 DIAGNOSIS — N186 End stage renal disease: Secondary | ICD-10-CM | POA: Diagnosis not present

## 2020-11-27 DIAGNOSIS — D689 Coagulation defect, unspecified: Secondary | ICD-10-CM | POA: Diagnosis not present

## 2020-11-27 DIAGNOSIS — N2581 Secondary hyperparathyroidism of renal origin: Secondary | ICD-10-CM | POA: Diagnosis not present

## 2020-11-27 DIAGNOSIS — Z992 Dependence on renal dialysis: Secondary | ICD-10-CM | POA: Diagnosis not present

## 2020-11-27 DIAGNOSIS — D631 Anemia in chronic kidney disease: Secondary | ICD-10-CM | POA: Diagnosis not present

## 2020-11-28 DIAGNOSIS — I129 Hypertensive chronic kidney disease with stage 1 through stage 4 chronic kidney disease, or unspecified chronic kidney disease: Secondary | ICD-10-CM | POA: Diagnosis not present

## 2020-11-28 DIAGNOSIS — Z992 Dependence on renal dialysis: Secondary | ICD-10-CM | POA: Diagnosis not present

## 2020-11-28 DIAGNOSIS — N186 End stage renal disease: Secondary | ICD-10-CM | POA: Diagnosis not present

## 2020-11-29 DIAGNOSIS — D689 Coagulation defect, unspecified: Secondary | ICD-10-CM | POA: Diagnosis not present

## 2020-11-29 DIAGNOSIS — N186 End stage renal disease: Secondary | ICD-10-CM | POA: Diagnosis not present

## 2020-11-29 DIAGNOSIS — Z992 Dependence on renal dialysis: Secondary | ICD-10-CM | POA: Diagnosis not present

## 2020-11-29 DIAGNOSIS — N2581 Secondary hyperparathyroidism of renal origin: Secondary | ICD-10-CM | POA: Diagnosis not present

## 2020-11-30 ENCOUNTER — Encounter: Payer: Self-pay | Admitting: Nurse Practitioner

## 2020-12-01 DIAGNOSIS — N2581 Secondary hyperparathyroidism of renal origin: Secondary | ICD-10-CM | POA: Diagnosis not present

## 2020-12-01 DIAGNOSIS — D689 Coagulation defect, unspecified: Secondary | ICD-10-CM | POA: Diagnosis not present

## 2020-12-01 DIAGNOSIS — Z992 Dependence on renal dialysis: Secondary | ICD-10-CM | POA: Diagnosis not present

## 2020-12-01 DIAGNOSIS — N186 End stage renal disease: Secondary | ICD-10-CM | POA: Diagnosis not present

## 2020-12-04 DIAGNOSIS — N186 End stage renal disease: Secondary | ICD-10-CM | POA: Diagnosis not present

## 2020-12-04 DIAGNOSIS — Z992 Dependence on renal dialysis: Secondary | ICD-10-CM | POA: Diagnosis not present

## 2020-12-04 DIAGNOSIS — D689 Coagulation defect, unspecified: Secondary | ICD-10-CM | POA: Diagnosis not present

## 2020-12-04 DIAGNOSIS — N2581 Secondary hyperparathyroidism of renal origin: Secondary | ICD-10-CM | POA: Diagnosis not present

## 2020-12-06 DIAGNOSIS — Z992 Dependence on renal dialysis: Secondary | ICD-10-CM | POA: Diagnosis not present

## 2020-12-06 DIAGNOSIS — N186 End stage renal disease: Secondary | ICD-10-CM | POA: Diagnosis not present

## 2020-12-06 DIAGNOSIS — D689 Coagulation defect, unspecified: Secondary | ICD-10-CM | POA: Diagnosis not present

## 2020-12-06 DIAGNOSIS — N2581 Secondary hyperparathyroidism of renal origin: Secondary | ICD-10-CM | POA: Diagnosis not present

## 2020-12-08 DIAGNOSIS — N2581 Secondary hyperparathyroidism of renal origin: Secondary | ICD-10-CM | POA: Diagnosis not present

## 2020-12-08 DIAGNOSIS — Z992 Dependence on renal dialysis: Secondary | ICD-10-CM | POA: Diagnosis not present

## 2020-12-08 DIAGNOSIS — D689 Coagulation defect, unspecified: Secondary | ICD-10-CM | POA: Diagnosis not present

## 2020-12-08 DIAGNOSIS — N186 End stage renal disease: Secondary | ICD-10-CM | POA: Diagnosis not present

## 2020-12-11 DIAGNOSIS — N186 End stage renal disease: Secondary | ICD-10-CM | POA: Diagnosis not present

## 2020-12-11 DIAGNOSIS — D689 Coagulation defect, unspecified: Secondary | ICD-10-CM | POA: Diagnosis not present

## 2020-12-11 DIAGNOSIS — N2581 Secondary hyperparathyroidism of renal origin: Secondary | ICD-10-CM | POA: Diagnosis not present

## 2020-12-11 DIAGNOSIS — Z992 Dependence on renal dialysis: Secondary | ICD-10-CM | POA: Diagnosis not present

## 2020-12-12 ENCOUNTER — Ambulatory Visit
Admission: RE | Admit: 2020-12-12 | Discharge: 2020-12-12 | Disposition: A | Discharge status: Home / Self Care | Payer: Medicare Other | Source: Ambulatory Visit | Attending: Nurse Practitioner | Admitting: Nurse Practitioner

## 2020-12-12 DIAGNOSIS — J432 Centrilobular emphysema: Secondary | ICD-10-CM | POA: Diagnosis not present

## 2020-12-12 DIAGNOSIS — F17210 Nicotine dependence, cigarettes, uncomplicated: Secondary | ICD-10-CM | POA: Diagnosis not present

## 2020-12-12 DIAGNOSIS — I2510 Atherosclerotic heart disease of native coronary artery without angina pectoris: Secondary | ICD-10-CM | POA: Diagnosis not present

## 2020-12-12 DIAGNOSIS — Z85038 Personal history of other malignant neoplasm of large intestine: Secondary | ICD-10-CM | POA: Diagnosis not present

## 2020-12-12 DIAGNOSIS — Z720 Tobacco use: Secondary | ICD-10-CM

## 2020-12-12 IMAGING — CT CT CHEST LUNG CANCER SCREENING LOW DOSE W/O CM
1 of 3 series · 10 of 40 positions shown, 13 images · non-contrast
Comparison: None.

CLINICAL DATA: 64-year-old female current smoker, with 60 pack-year
history of smoking, for initial lung cancer screening. Remote
history of colon cancer.

EXAM:
CT CHEST WITHOUT CONTRAST LOW-DOSE FOR LUNG CANCER SCREENING
TECHNIQUE: Multidetector CT imaging of the chest was performed following the
standard protocol without IV contrast.

[ct lung segmentation data · axial · 0.74mm/px · z∈[-358,-358]mm · 10 of 296 frames shown]
[frame 1/296  mediastinal]
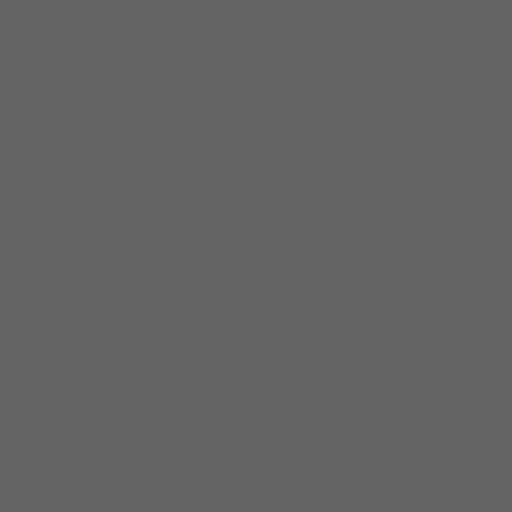
[frame 1/296  lung]
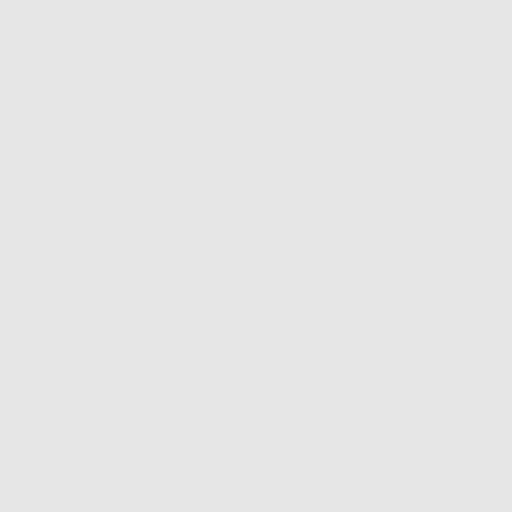
[frame 33/296  lung]
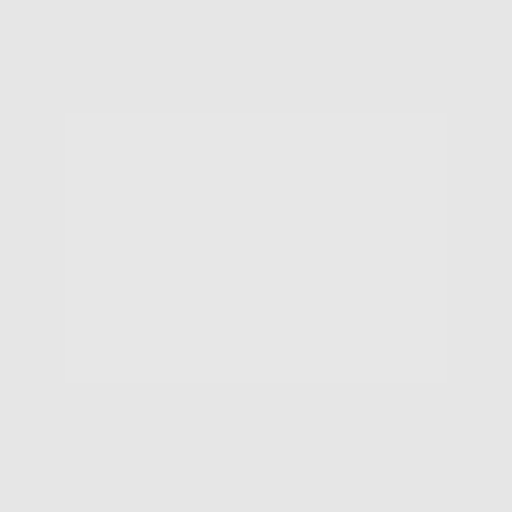
[frame 66/296  lung]
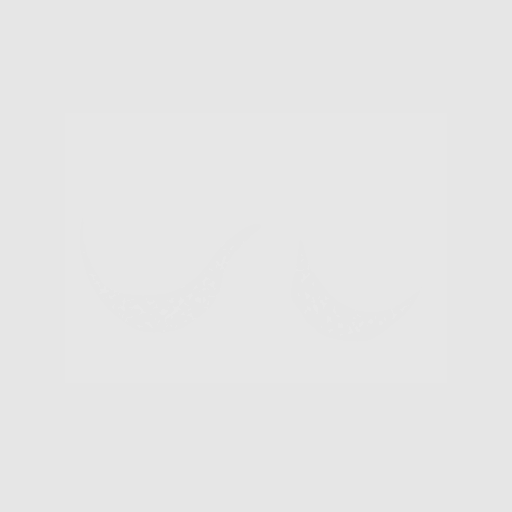
[frame 99/296  lung]
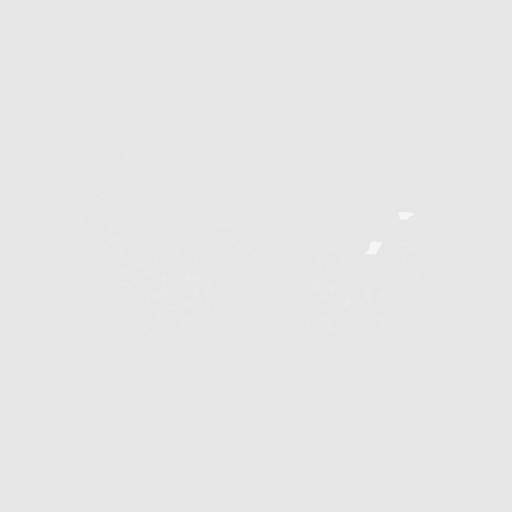
[frame 132/296  mediastinal]
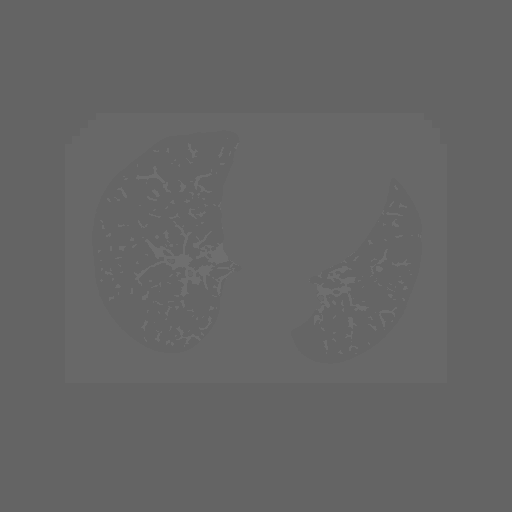
[frame 132/296  lung]
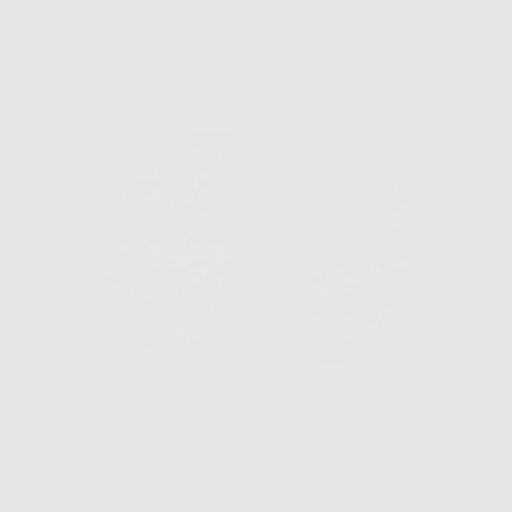
[frame 164/296  lung]
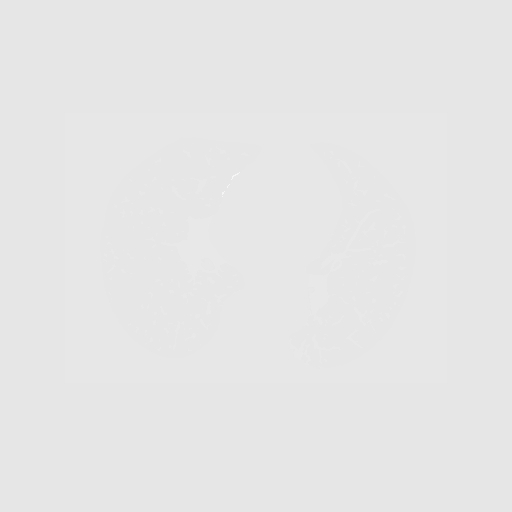
[frame 197/296  lung]
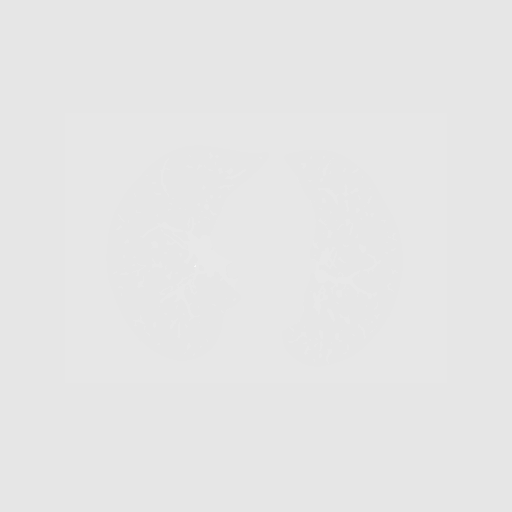
[frame 230/296  lung]
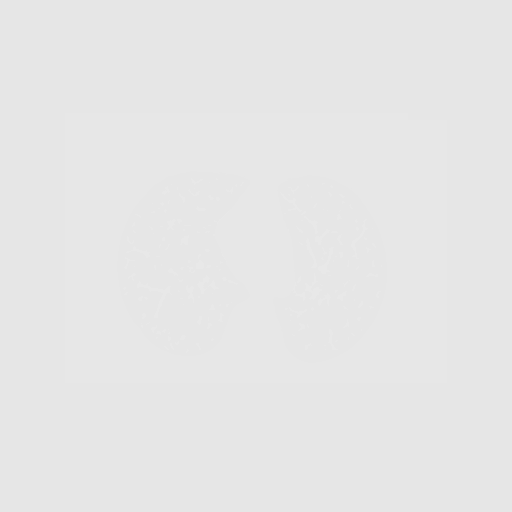
[frame 263/296  mediastinal]
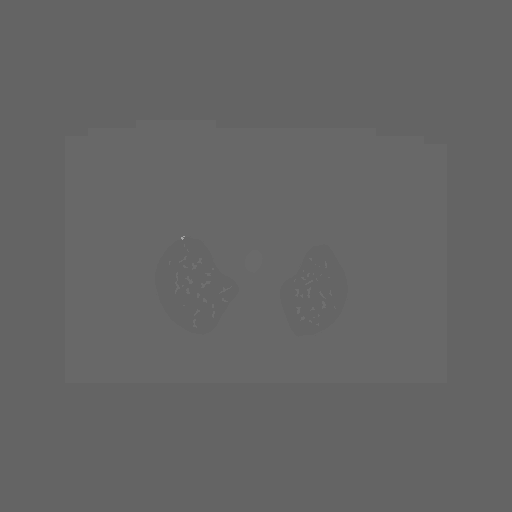
[frame 263/296  lung]
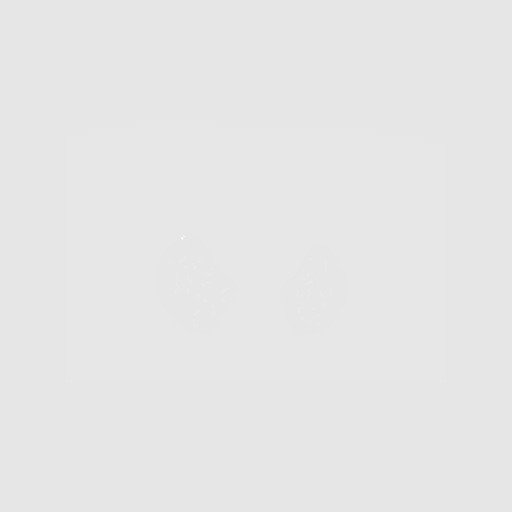
[frame 296/296  lung]
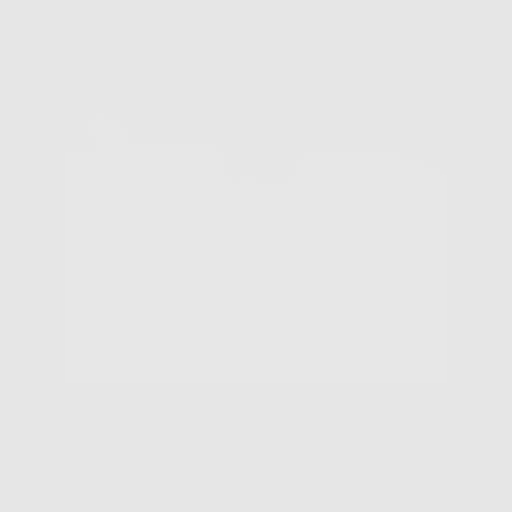

[10 of 40 positions shown; findings below may reference images not displayed]

FINDINGS: Cardiovascular: The heart is normal in size. No pericardial
effusion.

No evidence of thoracic aortic aneurysm. Atherosclerotic
calcifications of the aortic arch.

Right chest dual lumen dialysis catheter terminating at the
cavoatrial junction.

Three vessel coronary atherosclerosis.

Mediastinum/Nodes: No suspicious mediastinal lymphadenopathy.

Visualized thyroid is unremarkable.

Lungs/Pleura: Mild centrilobular emphysematous changes, upper lung
predominant.

No focal consolidation.

4.0 mm nodule in the posterior right middle lobe.

No pleural effusion or pneumothorax.

Upper Abdomen: Visualized upper abdomen is notable for prior
cholecystectomy and vascular calcifications.

Musculoskeletal: Mild degenerative changes of the visualized
thoracolumbar spine.
IMPRESSION: Lung-RADS 2, benign appearance or behavior. Continue annual
screening with low-dose chest CT without contrast in 12 months.

Aortic Atherosclerosis ([KK]-[KK]) and Emphysema ([KK]-[KK]).

## 2020-12-13 DIAGNOSIS — Z992 Dependence on renal dialysis: Secondary | ICD-10-CM | POA: Diagnosis not present

## 2020-12-13 DIAGNOSIS — N2581 Secondary hyperparathyroidism of renal origin: Secondary | ICD-10-CM | POA: Diagnosis not present

## 2020-12-13 DIAGNOSIS — N186 End stage renal disease: Secondary | ICD-10-CM | POA: Diagnosis not present

## 2020-12-13 DIAGNOSIS — D689 Coagulation defect, unspecified: Secondary | ICD-10-CM | POA: Diagnosis not present

## 2020-12-14 ENCOUNTER — Telehealth: Payer: Self-pay

## 2020-12-15 DIAGNOSIS — Z992 Dependence on renal dialysis: Secondary | ICD-10-CM | POA: Diagnosis not present

## 2020-12-15 DIAGNOSIS — N2581 Secondary hyperparathyroidism of renal origin: Secondary | ICD-10-CM | POA: Diagnosis not present

## 2020-12-15 DIAGNOSIS — N186 End stage renal disease: Secondary | ICD-10-CM | POA: Diagnosis not present

## 2020-12-15 DIAGNOSIS — D689 Coagulation defect, unspecified: Secondary | ICD-10-CM | POA: Diagnosis not present

## 2020-12-18 DIAGNOSIS — D689 Coagulation defect, unspecified: Secondary | ICD-10-CM | POA: Diagnosis not present

## 2020-12-18 DIAGNOSIS — Z992 Dependence on renal dialysis: Secondary | ICD-10-CM | POA: Diagnosis not present

## 2020-12-18 DIAGNOSIS — N2581 Secondary hyperparathyroidism of renal origin: Secondary | ICD-10-CM | POA: Diagnosis not present

## 2020-12-18 DIAGNOSIS — N186 End stage renal disease: Secondary | ICD-10-CM | POA: Diagnosis not present

## 2020-12-20 DIAGNOSIS — N186 End stage renal disease: Secondary | ICD-10-CM | POA: Diagnosis not present

## 2020-12-20 DIAGNOSIS — N2581 Secondary hyperparathyroidism of renal origin: Secondary | ICD-10-CM | POA: Diagnosis not present

## 2020-12-20 DIAGNOSIS — Z992 Dependence on renal dialysis: Secondary | ICD-10-CM | POA: Diagnosis not present

## 2020-12-20 DIAGNOSIS — D689 Coagulation defect, unspecified: Secondary | ICD-10-CM | POA: Diagnosis not present

## 2020-12-22 DIAGNOSIS — D689 Coagulation defect, unspecified: Secondary | ICD-10-CM | POA: Diagnosis not present

## 2020-12-22 DIAGNOSIS — N186 End stage renal disease: Secondary | ICD-10-CM | POA: Diagnosis not present

## 2020-12-22 DIAGNOSIS — N2581 Secondary hyperparathyroidism of renal origin: Secondary | ICD-10-CM | POA: Diagnosis not present

## 2020-12-22 DIAGNOSIS — Z992 Dependence on renal dialysis: Secondary | ICD-10-CM | POA: Diagnosis not present

## 2020-12-22 DIAGNOSIS — Z23 Encounter for immunization: Secondary | ICD-10-CM | POA: Diagnosis not present

## 2020-12-25 ENCOUNTER — Telehealth: Payer: Self-pay

## 2020-12-25 ENCOUNTER — Other Ambulatory Visit: Payer: Self-pay | Admitting: Nurse Practitioner

## 2020-12-25 DIAGNOSIS — D689 Coagulation defect, unspecified: Secondary | ICD-10-CM | POA: Diagnosis not present

## 2020-12-25 DIAGNOSIS — I700 Atherosclerosis of aorta: Secondary | ICD-10-CM

## 2020-12-25 DIAGNOSIS — N2581 Secondary hyperparathyroidism of renal origin: Secondary | ICD-10-CM | POA: Diagnosis not present

## 2020-12-25 DIAGNOSIS — N186 End stage renal disease: Secondary | ICD-10-CM | POA: Diagnosis not present

## 2020-12-25 DIAGNOSIS — Z992 Dependence on renal dialysis: Secondary | ICD-10-CM | POA: Diagnosis not present

## 2020-12-25 MED ORDER — ATORVASTATIN CALCIUM 10 MG PO TABS: ORAL_TABLET | ORAL | 1 refills | Status: DC

## 2020-12-27 DIAGNOSIS — N2581 Secondary hyperparathyroidism of renal origin: Secondary | ICD-10-CM | POA: Diagnosis not present

## 2020-12-27 DIAGNOSIS — Z992 Dependence on renal dialysis: Secondary | ICD-10-CM | POA: Diagnosis not present

## 2020-12-27 DIAGNOSIS — D689 Coagulation defect, unspecified: Secondary | ICD-10-CM | POA: Diagnosis not present

## 2020-12-27 DIAGNOSIS — N186 End stage renal disease: Secondary | ICD-10-CM | POA: Diagnosis not present

## 2020-12-27 NOTE — Chronic Care Management (AMB) (Signed)
Chronic Care Management Pharmacy Assistant   Name: Whitney Walker  MRN: [id number] DOB: May 21, 1956  Reason for Encounter: Medication Review  Patient Questions:  1.  Have you seen any other providers since your last visit? No  2.  Any changes in your medicines or health? No  PCP : [name], FNP  Allergies:   Allergies  Allergen Reactions  . Carnosine     Other reaction(s): Unknown  . Gadolinium Derivatives Hives and Other (See Comments)    HIVES, Desc: HIVES W/ "DYE" USED FOR 1ST CT SCAN BUT NOT 2ND, NO PREMEDS USED, PT UNCERTAIN OF CIRCUMSTANCES,,?POSSIBLE MRI CONTRAST ALLERGY, ALL STUDIES DONE "SOMEWHERE" IN [place]//A.C., Onset Date: [id number]  . Iohexol Other (See Comments)     Code: HIVES, Desc: HIVES W/ "DYE" USED FOR 1ST CT SCAN BUT NOT 2ND, NO PREMEDS USED, PT UNCERTAIN OF CIRCUMSTANCES,,?POSSIBLE MRI CONTRAST ALLERGY, ALL STUDIES DONE "SOMEWHERE" IN [place]//A.C., Onset Date: [id number]   . Iodine Hives  . Naltrexone     Other reaction(s): Unknown    Medications: Outpatient Encounter Medications as of 12/25/2020  Medication Sig Note  . acetaminophen (TYLENOL) 500 MG tablet Take 1,000 mg by mouth 2 (two) times daily as needed for moderate pain or headache.   . albuterol (PROVENTIL) (2.5 MG/3ML) 0.083% nebulizer solution Take 3 mLs (2.5 mg total) by nebulization every 4 (four) hours as needed for wheezing or shortness of breath.   [name] albuterol (VENTOLIN HFA) 108 (90 Base) MCG/ACT inhaler Inhale 2 puffs into the lungs every 6 (six) hours as needed for wheezing or shortness of breath.   [name] amLODipine (NORVASC) 5 MG tablet Take 1 tablet (5 mg total) by mouth daily.   [name] apixaban (ELIQUIS) 2.5 MG TABS tablet Take 1 tablet (2.5 mg total) by mouth 2 (two) times daily.   . Ascorbic Acid (VITAMIN C) 100 MG tablet Take 100 mg by mouth daily.   Lorin Picket 1 GM 210 MG(Fe) tablet Take 2-4 mg by mouth See admin instructions. 4gm with meal 2gm with snack 10/30/2020: Has  not started, starting when she leaves hospital to get prescription  . Biotin 10000 MCG TABS Take 10,000 mcg by mouth daily.    . carvedilol (COREG) 12.5 MG tablet Take 12.5 mg by mouth 2 (two) times daily with a meal.    . doxercalciferol (HECTOROL) 4 MCG/2ML injection Doxercalciferol (Hectorol)   . meclizine (ANTIVERT) 12.5 MG tablet Take 1 tablet (12.5 mg total) by mouth 3 (three) times daily as needed for dizziness.   . multivitamin (RENA-VIT) TABS tablet Take 1 tablet by mouth at bedtime.   . mupirocin ointment (BACTROBAN) 2 % Apply 1 application topically daily as needed (skin bumps).   . nystatin (NYSTATIN) powder Apply 1 application topically 4 (four) times daily as needed. (Patient taking differently: Apply 1 application topically 4 (four) times daily as needed (irriation). )   . ondansetron (ZOFRAN ODT) 4 MG disintegrating tablet Take 1 tablet (4 mg total) by mouth every 6 (six) hours as needed. (Patient taking differently: Take 4 mg by mouth every 6 (six) hours as needed for nausea or vomiting. )   . sevelamer carbonate (RENVELA) 800 MG tablet Take 1,600-3,200 mg by mouth 3 (three) times daily with meals. 1600mg  with snacks 3200mg  with meals 10/30/2020: DC as of 10/30/2020 & start ayruxia  . vitamin E (VITAMIN E) 200 UNIT capsule Take 200 Units by mouth daily.    No facility-administered encounter medications on file as of 12/25/2020.  Current Diagnosis: Patient Active Problem List   Diagnosis Date Noted  . Hyperkalemia 05/09/2020  . Acute respiratory failure with hypoxia ([place]) 05/09/2020  . Left leg pain 05/09/2020  . Restless leg syndrome 09/19/2018  . OSA (obstructive sleep apnea) 04/29/2018  . ESRD on dialysis ([place]) 04/29/2018  . Dyspnea on exertion 03/12/2017  . Morbid (severe) obesity due to excess calories ([place]) 03/12/2017  . Anemia of chronic kidney failure, stage 3 (moderate) ([place]) 12/10/2016  . History of colon cancer 08/12/2016  . Mass of left breast on mammogram  12/10/2015  . HTN (hypertension) 10/18/2015  . Depression 10/18/2015  . Numbness and tingling of left arm and leg 01/24/2015  . PVD (peripheral vascular disease) with claudication ([place]) 03/23/2014  . PAD (peripheral artery disease) ([place]) 12/14/2013  . Chronic diastolic heart failure ([place]) 11/15/2013  . Essential hypertension 11/15/2013  . Leucocytosis 04/28/2012    Reviewed chart for medication changes ahead of medication coordination call.  No OVs, Consults, or hospital visits since last care coordination call/Pharmacist visit.  No medication changes indicated.  BP Readings from Last 3 Encounters:  11/14/20 133/75  11/07/20 124/80  11/01/20 137/88    Lab Results  Component Value Date   HGBA1C 5.1 06/04/2018     Patient obtains medications through Vials  90 Days   Last adherence delivery included: None- no medication needs determined at last adherence call on 11/27/2020.  Patient declined the follow medications last month: Albuterol inhaler 108 mcg- inhale 2 puffs every 6 hours prn.  Carvedilol 12.5 mg- 1 tablet by mouth twice a day(M/W/F dialysisdaysonce daily(EM);T/Th/Sa/S Breakfast,(EM)and Eliquis 2.5 mg-1 tablet by mouth twice a day (Breakfast, EM) due to receiving 30 day supply on 11/02/2020.  Amlodipine 5 mg- 1 tablet daily due to receiving a 90 day on 09/28/20. Lunesta 2 mg- 1 tablet nightly (bedtime)due to not helping with Insomniapatient discontinued medication. Nystatin100 gm- apply four times a day as needed. Meclizine 12.5 mg- 1 tablet three times a day for dizziness due to PRN use.  Ondansetron 4 mg-  Dissolve 1 tablet under tongue every 6 hours due to PRN use.  Patient is due for next adherence delivery on: 12/29/2020. Called patient and reviewed medications.  This delivery to include: None, patient contacted pharmacy on 12/11/2020 to request refills of Coreg 12.5 mg, Eliquis 2.5 mg and Amlodipine 5 mg.  No short fill or acute fill  needed.  Patient declined the following medications: Albuterol inhaler 108 mcg- inhale 2 puffs every 6 hours due to PRN use.   Carvedilol 12.5 mg- 1 tablet daily on Mon, Wed, and Fridays and 1 tablet twice daily on Tues, Thurs, Sat and Sundays due to receiving a 84 days supply on 12/11/2020.  Eliquis 2.5 mg-1 tablet by mouth twice a day Amlodipine 5 mg- 1 tablet daily   Due to receiving a 90 day supply on 12/11/2020.  Lunesta 2 mg- 1 tablet nightly due to not helping with Insomniapatient discontinued Nystatin100 gm- apply four times a day as needed. Meclizine 12.5 mg- 1 tablet three times a day for dizziness due to PRN use.  Ondansetron 4 mg-  Dissolve 1 tablet under tongue every 6 hours due to PRN use.  Patient needs refills for- None.  Patient aware that we will follow up with her next month to review medications and determine if any medications are needed for delivery.  Follow-Up:  Coordination of Enhanced Pharmacy Services and Pharmacist Review  When reviewing medications patient mentioned that her Amlodipine was discontinued by  the Kidney specialist due to her blood pressure dropping too low during Dialysis treatments. Patient aware that I will relay this message to Maudry Mayhew, CPP to discuss with provider and I will also call the Dialysis center for blood pressure readings and contact Kidney specialist to get office notes. [name], CPP notified.  [name], [place] Pharmacist Assistant [phone]

## 2020-12-29 DIAGNOSIS — D689 Coagulation defect, unspecified: Secondary | ICD-10-CM | POA: Diagnosis not present

## 2020-12-29 DIAGNOSIS — Z992 Dependence on renal dialysis: Secondary | ICD-10-CM | POA: Diagnosis not present

## 2020-12-29 DIAGNOSIS — N186 End stage renal disease: Secondary | ICD-10-CM | POA: Diagnosis not present

## 2020-12-29 DIAGNOSIS — N2581 Secondary hyperparathyroidism of renal origin: Secondary | ICD-10-CM | POA: Diagnosis not present

## 2020-12-29 DIAGNOSIS — I129 Hypertensive chronic kidney disease with stage 1 through stage 4 chronic kidney disease, or unspecified chronic kidney disease: Secondary | ICD-10-CM | POA: Diagnosis not present

## 2021-01-02 NOTE — Progress Notes (Signed)
Patient did not complete stress test. Consider CT imagining instead. Will discuss further at upcoming appt.

## 2021-01-08 NOTE — Progress Notes (Deleted)
Primary Physician/Referring:  [name], FNP  Patient ID: Whitney Walker, female    DOB: 1956/05/23, 65 y.o.   MRN: [id number]  No chief complaint on file.  HPI:    Kenetha Cozza  is a 65 y.o. African-American female with history of obesity, hypertension, hyperlipidemia, tobacco use, stage IV kidney disease on dialysis, obstructive sleep apnea not on CPAP, COPD and severe peripheral arterial disease with history of left femoral-popliteal bypass surgery by Dr. Victorino Dike in 2014.  Patient is on chronic anticoagulation with Eliquis due to recurrent DVT and PE in 2014 and 2016.  Patient is followed by hematology for hypercoagulable state.  She is currently undergoing hemodialysis on a Monday/Wednesday/Friday schedule.  Patient was last seen in our office 12/03/2018 for follow-up of chest pain and shortness of breath which had significantly improved at that time and she was decided stable for as needed follow-up with [place].  Patient now presents for hospital follow-up.  Patient was recently admitted to [facility] as she reported to the emergency department with chest pain and shortness of breath.  Patient was determined to be volume overloaded and underwent urgent hemodialysis with significant improvement of symptoms.  Of note patient troponin was elevated trending from 31 to as high as 477.  EKG in the hospital did not reveal signs of ischemia and Dr. Einar Gip suspected type II MI secondary to underlying congestive heart failure.  Today patient states she continues to have occasional episodes of nonexertional left-sided chest pain lasting less than 1 minute which she describes as sharp and worse with inspiration.  She also has noted intermittent bilateral arm pain, which is not associated temporally with chest pain.  She denies palpitations, orthopnea, PND, leg swelling.  Patient does have chronic dyspnea, which she reports is stable and unchanged.  She denies symptoms of  claudication.  Of note patient does state that prior to her hospitalization she had "shorten dialysis", stating she was not actively undergoing hemodialysis for her entire appointment time.  It was suspected in the hospital this likely contributed to volume overload.  ***Patient presents for 6 week follow up of precordial pain and to discuss results are cardiac testing.   Past Medical History:  Diagnosis Date  . Anemia   . Anxiety   . CHF (congestive heart failure) ([place])   . Colon cancer [facility])    treatment surgery  . Complication of anesthesia    after first [facility]- [place] "couldnt walk after", patient denies having a spinal  . COPD (chronic obstructive pulmonary disease) ([place])   . Coronary artery disease   . Depression   . DVT (deep venous thrombosis) ([place])   . ESRD (end stage renal disease) ([place])    Hemo: MWF  . History of blood transfusion 04/2018  . Hypertension    07/07/18- no longer takes BP medications  . Meningitis   . Pain in limb 07/30/2013  . PE (pulmonary embolism)   . Peripheral vascular disease ([place])   . Restless legs   . Shortness of breath    with exertion  . Sleep apnea   . SOB (shortness of breath) 03/03/2019  . Vertigo    Past Surgical History:  Procedure Laterality Date  . ABDOMINAL AORTAGRAM N/A 07/26/2013   Procedure: ABDOMINAL Maxcine Ham;  Surgeon: [name] Canaan, MD;  Location: [facility] CATH LAB;  Service: Cardiovascular;  Laterality: N/A;  . ABDOMINAL HYSTERECTOMY    . AV FISTULA PLACEMENT Left 05/05/2018   Procedure: ARTERIOVENOUS (AV) FISTULA CREATION  BRACHIOCEPHALIC;  Surgeon: Waynetta Sandy, MD;  Location: [place];  Service: Vascular;  Laterality: Left;  . AV FISTULA PLACEMENT Left 07/16/2018   Procedure: ARTERIOVENOUS FISTULA CREATION;  Surgeon: Waynetta Sandy, MD;  Location: [place];  Service: Vascular;  Laterality: Left;  . BASCILIC VEIN TRANSPOSITION Left 09/03/2018   Procedure: BASILIC VEIN TRANSPOSITION SECOND STAGE;  Surgeon: Waynetta Sandy, MD;  Location: [place];  Service: Vascular;  Laterality: Left;  . BREAST BIOPSY Left   . CESAREAN SECTION     X 3 [id number]  . CHOLECYSTECTOMY    . CHOLECYSTECTOMY  1980/s  . COLECTOMY  2010  . DIVERTICULOSIS SURGERY-2002  2012  . FEMORAL-POPLITEAL BYPASS GRAFT Left 08/13/2013   Procedure: BYPASS GRAFT FEMORAL-POPLITEAL ARTERY WITH NON-REVERSED SAPHANEOUS VEIN; ULTRASOUND GUIDED;  Surgeon: Mal [name], MD;  Location: [place];  Service: Vascular;  Laterality: Left;  . INSERTION OF DIALYSIS CATHETER Right 04/30/2018   Procedure: INSERTION OF Right Internal Jugular DIALYSIS CATHETER;  Surgeon: [name], MD;  Location: [place];  Service: Vascular;  Laterality: Right;  . IR FLUORO GUIDE CV LINE RIGHT  07/24/2020  . IR REMOVAL TUN CV CATH W/O FL  07/21/2020  . IR US GUIDE VASC ACCESS RIGHT  07/24/2020  . LOWER EXTREMITY ANGIOGRAM Left 07/26/2013   Procedure: LOWER EXTREMITY ANGIOGRAM;  Surgeon: [name] Braselton, MD;  Location[facility] C[facility] LAB;  Service: Cardiovascular;  Laterality: Left;   Family History  Problem Relation Age of Onset  . Cancer Mother 76       breast and bone  . Cancer Father 40       prostate  . Hypertension Sister   . Bleeding Disorder Sister   . Cancer Cousin 20       breast cancer   . Hypertension Daughter   . Breast cancer Neg Hx     Social History   Tobacco Use  . Smoking status: Current Some Day Smoker    Packs/day: 0.50    Years: 50.00    Pack years: 25.00    Types: Cigarettes  . Smokeless tobacco: Former Systems developer  . Tobacco comment: has an occasional cigarette,   Substance Use Topics  . Alcohol use: No    Alcohol/week: 0.0 standard drinks    Comment: she used to drink alcohol, quit in 2010    Marital Status: Single   ROS  Review of Systems  Constitutional: Negative for malaise/fatigue and weight gain.  Cardiovascular: Positive for chest pain and dyspnea on exertion (Chronic, stable). Negative for claudication, leg swelling, near-syncope,  orthopnea, palpitations, paroxysmal nocturnal dyspnea and syncope.  Hematologic/Lymphatic: Does not bruise/bleed easily.  Gastrointestinal: Negative for melena.  Neurological: Negative for dizziness and weakness.    Objective  There were no vitals taken for this visit.  Vitals with BMI 11/14/2020 11/07/2020 11/01/2020  Height 5\' 1"  5' 1.8" -  Weight 200 lbs 197 lbs 10 oz -  BMI 40.97 35.32 -  Systolic 992 426 834  Diastolic 75 80 88  Pulse 72 80 101      Physical Exam Vitals reviewed.  Constitutional:      Comments: Tunneled dialysis catheter in place  HENT:     Head: Normocephalic and atraumatic.  Cardiovascular:     Rate and Rhythm: Normal rate and regular rhythm.     Pulses: Intact distal pulses.          Carotid pulses are 2+ on the right side and 2+ on the left side with  bruit.      Radial pulses are 2+ on the right side and 2+ on the left side.       Femoral pulses are 2+ on the right side and 2+ on the left side.      Popliteal pulses are 2+ on the right side and 0 on the left side.       Dorsalis pedis pulses are 1+ on the right side and 0 on the left side.       Posterior tibial pulses are 2+ on the right side and 1+ on the left side.     Heart sounds: S1 normal and S2 normal. Murmur heard.  Systolic murmur is present at the upper right sternal border and upper left sternal border.  No gallop.   Pulmonary:     Effort: Pulmonary effort is normal. No respiratory distress.     Breath sounds: No wheezing, rhonchi or rales.  Musculoskeletal:     Right lower leg: No edema.     Left lower leg: No edema.  Skin:    General: Skin is warm.     Capillary Refill: Capillary refill takes less than 2 seconds.     Coloration: Skin is not pale.     Findings: No erythema.  Neurological:     Mental Status: She is alert.    Laboratory examination:   Recent Labs    05/09/20 1246 05/10/20 0631 05/18/20 1036 10/18/20 0000 10/30/20 0407 10/30/20 0603 10/31/20 0027  11/01/20 0035 11/08/20 0000  NA 136 135 133*  --  131*  --  136 135  --   K 4.8 5.1 5.1   < > >7.5*   < > 5.2* 6.3* 4.9  CL 96* 97* 96*  --  98  --  93* 95*  --   CO2 26 25 27   --  17*  --  28 25  --   GLUCOSE 71 100* 99  --  98  --  100* 99  --   BUN 19 35* 38*  --  69*  --  39* 61*  --   CREATININE 5.80* 8.44* 6.98*  --  12.21*  --  8.04* 10.69*  --   CALCIUM 9.1 9.1 9.9  --  9.8  --  8.9 9.1  --   GFRNONAA 7* 5* 6*  --  3*  --  5* 4*  --   GFRAA 8* 5* 7*  --   --   --   --   --   --    < > = values in this interval not displayed.   CrCl cannot be calculated ([name] most recent lab result is older than the maximum 21 days allowed.).  CMP Latest Ref Rng & Units 11/08/2020 11/01/2020 10/31/2020  Glucose 70 - 99 mg/dL - 99 100(H)  BUN 8 - 23 mg/dL - 61(H) 39(H)  Creatinine 0.44 - 1.00 mg/dL - 10.69(H) 8.04(H)  Sodium 135 - 145 mmol/L - 135 136  Potassium 3.4 - 5.3 4.9 6.3(HH) 5.2(H)  Chloride 98 - 111 mmol/L - 95(L) 93(L)  CO2 22 - 32 mmol/L - 25 28  Calcium 8.9 - 10.3 mg/dL - 9.1 8.9  Total Protein 6.5 - 8.1 g/dL - - -  Total Bilirubin 0.3 - 1.2 mg/dL - - -  Alkaline Phos 38 - 126 U/L - - -  AST 15 - 41 U/L - - -  ALT 0 - 44 U/L - - -   CBC Latest Ref Rng &  Units 11/15/2020 11/01/2020 10/31/2020  WBC 4.0 - 10.5 K/uL - 11.2(H) 11.4(H)  Hemoglobin 12.0 - 16.0 32.4(A) 9.8(L) 10.2(L)  Hematocrit 36.0 - 46.0 % - 31.0(L) 33.0(L)  Platelets 150 - 400 K/uL - 161 148(L)    Lipid Panel No results for input(s): CHOL, TRIG, LDLCALC, VLDL, HDL, CHOLHDL, LDLDIRECT in the last 8760 hours.  HEMOGLOBIN A1C Lab Results  Component Value Date   HGBA1C 5.1 06/04/2018   MPG 140 09/07/2009   TSH No results for input(s): TSH in the last 8760 hours.  External labs:   06/04/2018: HDL 63, LDL 97, total cholesterol 191, triglycerides 155  Medications and allergies   Allergies  Allergen Reactions  . Carnosine     Other reaction(s): Unknown  . Gadolinium Derivatives Hives and Other (See  Comments)    HIVES, Desc: HIVES W/ "DYE" USED FOR 1ST CT SCAN BUT NOT 2ND, NO PREMEDS USED, PT UNCERTAIN OF CIRCUMSTANCES,,?POSSIBLE MRI CONTRAST ALLERGY, ALL STUDIES DONE "SOMEWHERE" IN [place]//A.C., Onset Date: [id number]  . Iohexol Other (See Comments)     Code: HIVES, Desc: HIVES W/ "DYE" USED FOR 1ST CT SCAN BUT NOT 2ND, NO PREMEDS USED, PT UNCERTAIN OF CIRCUMSTANCES,,?POSSIBLE MRI CONTRAST ALLERGY, ALL STUDIES DONE "SOMEWHERE" IN [place]//A.C., Onset Date: [id number]   . Iodine Hives  . Naltrexone     Other reaction(s): Unknown     Outpatient Medications Prior to Visit  Medication Sig Dispense Refill  . acetaminophen (TYLENOL) 500 MG tablet Take 1,000 mg by mouth 2 (two) times daily as needed for moderate pain or headache.    . albuterol (PROVENTIL) (2.5 MG/3ML) 0.083% nebulizer solution Take 3 mLs (2.5 mg total) by nebulization every 4 (four) hours as needed for wheezing or shortness of breath. 75 mL 2  . albuterol (VENTOLIN HFA) 108 (90 Base) MCG/ACT inhaler Inhale 2 puffs into the lungs every 6 (six) hours as needed for wheezing or shortness of breath. 18 g 0  . amLODipine (NORVASC) 5 MG tablet Take 1 tablet (5 mg total) by mouth daily. 90 tablet 1  . apixaban (ELIQUIS) 2.5 MG TABS tablet Take 1 tablet (2.5 mg total) by mouth 2 (two) times daily. 180 tablet 3  . Ascorbic Acid (VITAMIN C) 100 MG tablet Take 100 mg by mouth daily.    [name] atorvastatin (LIPITOR) 10 MG tablet Take 1 tablet by mouth MWF at bedtime 45 tablet 1  . AURYXIA 1 GM 210 MG(Fe) tablet Take 2-4 mg by mouth See admin instructions. 4gm with meal 2gm with snack    . Biotin 10000 MCG TABS Take 10,000 mcg by mouth daily.     . carvedilol (COREG) 12.5 MG tablet Take 12.5 mg by mouth 2 (two) times daily with a meal.     . doxercalciferol (HECTOROL) 4 MCG/2ML injection Doxercalciferol (Hectorol)    . meclizine (ANTIVERT) 12.5 MG tablet Take 1 tablet (12.5 mg total) by mouth 3 (three) times daily as needed for dizziness.  30 tablet 0  . multivitamin (RENA-VIT) TABS tablet Take 1 tablet by mouth at bedtime.    . mupirocin ointment (BACTROBAN) 2 % Apply 1 application topically daily as needed (skin bumps). 22 g 2  . nystatin (NYSTATIN) powder Apply 1 application topically 4 (four) times daily as needed. (Patient taking differently: Apply 1 application topically 4 (four) times daily as needed (irriation). ) 30 g 1  . ondansetron (ZOFRAN ODT) 4 MG disintegrating tablet Take 1 tablet (4 mg total) by mouth every 6 (six) hours as needed. (Patient taking  differently: Take 4 mg by mouth every 6 (six) hours as needed for nausea or vomiting. ) 20 tablet 0  . sevelamer carbonate (RENVELA) 800 MG tablet Take 1,600-3,200 mg by mouth 3 (three) times daily with meals. 1600mg  with snacks 3200mg  with meals    . vitamin E (VITAMIN E) 200 UNIT capsule Take 200 Units by mouth daily.     No facility-administered medications prior to visit.     Radiology:   No results found.  Cardiac Studies:   PCV MYOCARDIAL PERFUSION WO LEXISCAN 11/20/2020 Lexiscan not administered at patient's request. Incomplete study. EKG tracing not available for review. Rest images show mild decrease in tracer update in basal inferior myocardium. Consider alternate ischemia studies, if clinically indicated.  Carotid artery duplex 11/16/2020:  Stenosis in the right internal carotid artery (1-15%).  Stenosis in the left internal carotid artery (50-69%).  Antegrade right vertebral artery flow. Antegrade left vertebral artery flow.  Follow up in six months is appropriate if clinically indicated.  PCV ECHOCARDIOGRAM COMPLETE 11/16/2020 Visually, hyperdynamic LV systolic function with visual EF >70%. Left ventricle cavity is normal in size. Severe left ventricular hypertrophy.  Normal global wall motion. Unable to evaluate diastolic function due to mitral annular calcification. Elevated LAP.  Consider workup for hypertrophic cardiomyopathy if clinically  indicated. Native valve, mitral annular calcification.  Systolic anterior motion of mitral valve.  Mild mitral stenosis.  Mild (Grade I) mitral regurgitation. Mild tricuspid regurgitation. No evidence of pulmonary hypertension. Moderate pulmonic regurgitation. Compared to prior study 03/26/2017: no significant change.  Nuclear stress test Lexiscan Myoview 08/05/2013: Resting EKG NSR, poor R wave progression.  Stress EKG was nondiagnostic for ischemia.  No ST-T changes of ischemia noted with pharmacologic stress testing.  Stress symptoms included shortness of breath and nausea.  Stress terminated due to completion of protocol. The perfusion study demonstrated normal isotope uptake both at rest and stress.  There was no evidence of ischemia or scar.  Dynamic gated images revealed normal wall motion and endocardial thickening.  Left ventricular ejection fraction estimated to be 75%.  No significant change from 01/03/2012.    CT neck 10/31/2011: Normal soft tissues.  Mild pulmonary artery dilation suggest hypertension.  Sleep study 2007: Sleep apnea, noncompliant with CPAP uses supplemental oxygen only.  EKG:   11/14/2020: Sinus rhythm at a rate of 82 bpm with first-degree AV block, biatrial enlargement.  Left axis deviation, left anterior fascicular block.  Poor R wave progression, cannot exclude anteroseptal infarct old.  Cannot exclude inferior ischemia.  Incomplete right bundle branch block.  Compared to EKG 10/30/2020 (external), no significant change.  12/03/2018: Sinus tachycardia at 113 beats per minute with first-degree AV block.  Normal axis.  PR WP cannot exclude anterior infarct old.  No evidence of ischemia.  Except for tachycardia no changes noted to EKG.  Assessment     ICD-10-CM   1. Asymptomatic stenosis of left carotid artery  I65.22   2. Chronic diastolic heart failure (HCC)  I50.32   3. Essential hypertension  I10   4. Precordial pain  R07.2   5. PAOD (peripheral arterial  occlusive disease) (HCC)  I77.9      There are no discontinued medications.  No orders of the defined types were placed in this encounter.   Recommendations:   Reily Treloar is a 65 y.o. African-American female with history of obesity, hypertension, hyperlipidemia, tobacco use, stage IV kidney disease on dialysis, obstructive sleep apnea not on CPAP, COPD and severe peripheral arterial disease  with history of left femoral-popliteal bypass surgery by Dr. Victorino Dike in 2014.  Patient is on chronic anticoagulation with Eliquis due to recurrent DVT and PE in 2014 and 2016.  Patient is followed by hematology for hypercoagulable state.  She is currently undergoing hemodialysis on a Monday/Wednesday/Friday schedule.  Patient presents for hospital follow-up with primary concerns of chest pain as well as elevated troponin suspicious for type II MI due to underlying congestive heart failure.  Patient continues to have frequent episodes of chest pain with both typical and atypical characteristics.  She does have multiple cardiovascular risk factors as well as history of severe peripheral arterial disease.  In view of symptoms as well as elevated troponin and BNP in the hospital recommend further evaluation of heart structure and function as well as ischemic work-up.  Will obtain echocardiogram and nuclear stress test.  On exam new left carotid bruit noted, will obtain bilateral carotid artery duplex.  We will also repeat BNP in order to trend from hospital stay.  On exam patient does have reduced distal pulses bilateral lower extremities, worse on the left. However, she is without symptoms of claudication and there are no signs of critical limb ischemia. Therefore, do not feel ABI is warranted at this time, but could consider in the future.   I personally reviewed external labs, there is no recent lipid profile testing for review.  Will obtain lipid panel in order to optimize modifiable cardiovascular risk  factors.  Also discussed with patient regarding importance of CPAP use.  [name] anticoagulation with Eliquis is currently being managed by hematology, will defer further management to them.  Blood pressure is well controlled.  Counseled patient regarding smoking cessation.  She does not wish for me to prescribe nicotine replacement therapy at this time.  Follow-up in 8 weeks for symptom recheck and results of cardiac testing.  During this visit I reviewed and updated: Tobacco history  allergies medication reconciliation  medical history  surgical history  family history  social history.  ***Stress test - not complete, consider CT instead.  Echo - no change.  Carotid duplex - stenosis left >right, med manage. Lipid panel - needed? Smoking? CPAP?

## 2021-01-09 ENCOUNTER — Ambulatory Visit: Payer: Medicare Other | Admitting: Student

## 2021-01-09 DIAGNOSIS — I10 Essential (primary) hypertension: Secondary | ICD-10-CM

## 2021-01-09 DIAGNOSIS — R072 Precordial pain: Secondary | ICD-10-CM

## 2021-01-09 DIAGNOSIS — I5032 Chronic diastolic (congestive) heart failure: Secondary | ICD-10-CM

## 2021-01-09 DIAGNOSIS — I779 Disorder of arteries and arterioles, unspecified: Secondary | ICD-10-CM

## 2021-01-09 DIAGNOSIS — I6522 Occlusion and stenosis of left carotid artery: Secondary | ICD-10-CM

## 2021-01-10 ENCOUNTER — Telehealth: Payer: Medicare Other

## 2021-01-14 NOTE — Progress Notes (Addendum)
Primary Physician/Referring:  [name], FNP  Patient ID: Whitney Walker, female    DOB: 14-May-1956, 65 y.o.   MRN: [id number]  I connected with the patient on 01/16/21 by a telephone call and verified that I am speaking with the correct person using two identifiers.     I offered the patient a video enabled application for a virtual visit. Unfortunately, this could not be accomplished due to technical difficulties/lack of video enabled phone/computer. I discussed the limitations of evaluation and management by telemedicine and the availability of in person appointments. The patient expressed understanding and agreed to proceed.   This visit type was conducted due to national recommendations for restrictions regarding the COVID-19 Pandemic (e.g. social distancing).  This format is felt to be most appropriate for this patient at this time.  All issues noted in this document were discussed and addressed.  No physical exam was performed (except for noted visual exam findings with Tele health visits).  The patient has consented to conduct a Tele health visit and understands insurance will be billed.   Chief Complaint  Patient presents with  . Follow-up  . precordial pain   HPI:    Whitney Walker  is a 65 y.o. African-American female with history of obesity, hypertension, hyperlipidemia, tobacco use, stage IV kidney disease on dialysis, obstructive sleep apnea not on CPAP, COPD and severe peripheral arterial disease with history of left femoral-popliteal bypass surgery by Dr. Victorino Dike in 2014.  Patient is on chronic anticoagulation with Eliquis due to recurrent DVT and PE in 2014 and 2016.  Patient is followed by hematology for hypercoagulable state.  She is currently undergoing hemodialysis on a Monday/Wednesday/Friday schedule.  Patient presents for 6 week follow up of precordial pain and to discuss results are cardiac testing.  Patient reports since her last visit she has only had  1 episode of nonexertional chest pain lasting approximately 1 minute.  This is significantly improved compared to previous frequency of symptoms.  Her primary concern today is that over the last few weeks her blood pressure at the end of dialysis has been low.  She is following with nephrology who increased patient's dry weight at her last dialysis appointment.  Patient continues to only take antihypertensive medications on nondialysis days.  Of note patient reports she continues to occasionally smoke cigarettes.  At last visit patient was scheduled to undergo nuclear stress testing, however this was unable to be completed as Lexiscan could not be administered.  Of note patient reports home blood pressure readings averaging 120s/78 mmHg.  Also patient is noncompliant with CPAP, does not wear it every night.  Past Medical History:  Diagnosis Date  . Anemia   . Anxiety   . CHF (congestive heart failure) ([place])   . Colon cancer [facility])    treatment surgery  . Complication of anesthesia    after first [place]- [place] "couldnt walk after", patient denies having a spinal  . COPD (chronic obstructive pulmonary disease) ([place])   . Coronary artery disease   . Depression   . DVT (deep venous thrombosis) ([place])   . ESRD (end stage renal disease) ([place])    Hemo: MWF  . History of blood transfusion 04/2018  . Hypertension    07/07/18- no longer takes BP medications  . Meningitis   . Pain in limb 07/30/2013  . PE (pulmonary embolism)   . Peripheral vascular disease ([place])   . Restless legs   . Shortness of breath  with exertion  . Sleep apnea   . SOB (shortness of breath) 03/03/2019  . Vertigo    Past Surgical History:  Procedure Laterality Date  . ABDOMINAL AORTAGRAM N/A 07/26/2013   Procedure: ABDOMINAL Maxcine Ham;  Surgeon: [name] [place], MD;  Location: [facility] CATH LAB;  Service: Cardiovascular;  Laterality: N/A;  . ABDOMINAL HYSTERECTOMY    . AV FISTULA PLACEMENT Left 05/05/2018   Procedure: ARTERIOVENOUS (AV)  FISTULA CREATION BRACHIOCEPHALIC;  Surgeon: Waynetta Sandy, MD;  Location: [place];  Service: Vascular;  Laterality: Left;  . AV FISTULA PLACEMENT Left 07/16/2018   Procedure: ARTERIOVENOUS FISTULA CREATION;  Surgeon: Waynetta Sandy, MD;  Location: [place];  Service: Vascular;  Laterality: Left;  . BASCILIC VEIN TRANSPOSITION Left 09/03/2018   Procedure: BASILIC VEIN TRANSPOSITION SECOND STAGE;  Surgeon: Waynetta Sandy, MD;  Location: [place];  Service: Vascular;  Laterality: Left;  . BREAST BIOPSY Left   . CESAREAN SECTION     X 3 [id number]  . CHOLECYSTECTOMY    . CHOLECYSTECTOMY  1980/s  . COLECTOMY  2010  . DIVERTICULOSIS SURGERY-2002  2012  . FEMORAL-POPLITEAL BYPASS GRAFT Left 08/13/2013   Procedure: BYPASS GRAFT FEMORAL-POPLITEAL ARTERY WITH NON-REVERSED SAPHANEOUS VEIN; ULTRASOUND GUIDED;  Surgeon: Mal [name], MD;  Location: [place];  Service: Vascular;  Laterality: Left;  . INSERTION OF DIALYSIS CATHETER Right 04/30/2018   Procedure: INSERTION OF Right Internal Jugular DIALYSIS CATHETER;  Surgeon: [name], MD;  Location: [place];  Service: Vascular;  Laterality: Right;  . IR FLUORO GUIDE CV LINE RIGHT  07/24/2020  . IR REMOVAL TUN CV CATH W/O FL  07/21/2020  . IR US GUIDE VASC ACCESS RIGHT  07/24/2020  . LOWER EXTREMITY ANGIOGRAM Left 07/26/2013   Procedure: LOWER EXTREMITY ANGIOGRAM;  Surgeon: [name] [place], MD;  Location: [name] CATH LAB;  Service: Cardiovascular;  Laterality: Left;   Family History  Problem Relation Age of Onset  . Cancer Mother 44       breast and bone  . Cancer Father 25       prostate  . Hypertension Sister   . Bleeding Disorder Sister   . Cancer Cousin 20       breast cancer   . Hypertension Daughter   . Breast cancer Neg Hx     Social History   Tobacco Use  . Smoking status: Current Some Day Smoker    Packs/day: 0.50    Years: 50.00    Pack years: 25.00    Types: Cigarettes  . Smokeless tobacco: Former Systems developer  . Tobacco  comment: has an occasional cigarette,   Substance Use Topics  . Alcohol use: No    Alcohol/week: 0.0 standard drinks    Comment: she used to drink alcohol, quit in 2010    Marital Status: Single   ROS  Review of Systems  Constitutional: Negative for malaise/fatigue and weight gain.  Cardiovascular: Positive for chest pain (improved) and dyspnea on exertion (Chronic, stable). Negative for claudication, leg swelling, near-syncope, orthopnea, palpitations, paroxysmal nocturnal dyspnea and syncope.  Hematologic/Lymphatic: Does not bruise/bleed easily.  Gastrointestinal: Negative for melena.  Neurological: Negative for dizziness and weakness.    Objective  Blood pressure (!) 153/96.  Vitals with BMI 01/16/2021 11/14/2020 11/07/2020  Height - 5\' 1"  5' 1.8"  Weight - 200 lbs 197 lbs 10 oz  BMI - 26.37 85.88  Systolic 502 774 128  Diastolic 96 75 80  Pulse - 72 80    Vital signs for  today's visit were taken and reported by patient using home monitoring device.   Due to constraints of telehealth visit today, physical exam is limited. Physical Exam  Constitutional: She appears healthy. No distress.  Eyes: Conjunctivae are normal.  Pulmonary/Chest: Effort normal and breath sounds normal.  Neurological: She is alert and oriented to person, place, and time.    Physical exam from previous office visit on 11/14/2020:  Physical Exam Vitals reviewed.  Constitutional:      Comments: Tunneled dialysis catheter in place  HENT:     Head: Normocephalic and atraumatic.  Cardiovascular:     Rate and Rhythm: Normal rate and regular rhythm.     Pulses: Intact distal pulses.          Carotid pulses are 2+ on the right side and 2+ on the left side with bruit.      Radial pulses are 2+ on the right side and 2+ on the left side.       Femoral pulses are 2+ on the right side and 2+ on the left side.      Popliteal pulses are 2+ on the right side and 0 on the left side.       Dorsalis pedis pulses  are 1+ on the right side and 0 on the left side.       Posterior tibial pulses are 2+ on the right side and 1+ on the left side.     Heart sounds: S1 normal and S2 normal. Murmur heard.  Systolic murmur is present at the upper right sternal border and upper left sternal border.  No gallop.   Pulmonary:     Effort: Pulmonary effort is normal. No respiratory distress.     Breath sounds: No wheezing, rhonchi or rales.  Musculoskeletal:     Right lower leg: No edema.     Left lower leg: No edema.  Skin:    General: Skin is warm.     Capillary Refill: Capillary refill takes less than 2 seconds.     Coloration: Skin is not pale.     Findings: No erythema.  Neurological:     Mental Status: She is alert.    Laboratory examination:   Recent Labs    05/09/20 1246 05/10/20 0631 05/18/20 1036 10/18/20 0000 10/30/20 0407 10/30/20 0603 10/31/20 0027 11/01/20 0035 11/08/20 0000  NA 136 135 133*  --  131*  --  136 135  --   K 4.8 5.1 5.1   < > >7.5*   < > 5.2* 6.3* 4.9  CL 96* 97* 96*  --  98  --  93* 95*  --   CO2 26 25 27   --  17*  --  28 25  --   GLUCOSE 71 100* 99  --  98  --  100* 99  --   BUN 19 35* 38*  --  69*  --  39* 61*  --   CREATININE 5.80* 8.44* 6.98*  --  12.21*  --  8.04* 10.69*  --   CALCIUM 9.1 9.1 9.9  --  9.8  --  8.9 9.1  --   GFRNONAA 7* 5* 6*  --  3*  --  5* 4*  --   GFRAA 8* 5* 7*  --   --   --   --   --   --    < > = values in this interval not displayed.   CrCl cannot be calculated ([name] most recent lab  result is older than the maximum 21 days allowed.).  CMP Latest Ref Rng & Units 11/08/2020 11/01/2020 10/31/2020  Glucose 70 - 99 mg/dL - 99 100(H)  BUN 8 - 23 mg/dL - 61(H) 39(H)  Creatinine 0.44 - 1.00 mg/dL - 10.69(H) 8.04(H)  Sodium 135 - 145 mmol/L - 135 136  Potassium 3.4 - 5.3 4.9 6.3(HH) 5.2(H)  Chloride 98 - 111 mmol/L - 95(L) 93(L)  CO2 22 - 32 mmol/L - 25 28  Calcium 8.9 - 10.3 mg/dL - 9.1 8.9  Total Protein 6.5 - 8.1 g/dL - - -  Total  Bilirubin 0.3 - 1.2 mg/dL - - -  Alkaline Phos 38 - 126 U/L - - -  AST 15 - 41 U/L - - -  ALT 0 - 44 U/L - - -   CBC Latest Ref Rng & Units 11/15/2020 11/01/2020 10/31/2020  WBC 4.0 - 10.5 K/uL - 11.2(H) 11.4(H)  Hemoglobin 12.0 - 16.0 32.4(A) 9.8(L) 10.2(L)  Hematocrit 36.0 - 46.0 % - 31.0(L) 33.0(L)  Platelets 150 - 400 K/uL - 161 148(L)    Lipid Panel No results for input(s): CHOL, TRIG, LDLCALC, VLDL, HDL, CHOLHDL, LDLDIRECT in the last 8760 hours.  HEMOGLOBIN A1C Lab Results  Component Value Date   HGBA1C 5.1 06/04/2018   MPG 140 09/07/2009   TSH No results for input(s): TSH in the last 8760 hours.  External labs:   06/04/2018: HDL 63, LDL 97, total cholesterol 191, triglycerides 155  Medications and allergies   Allergies  Allergen Reactions  . Carnosine     Other reaction(s): Unknown  . Gadolinium Derivatives Hives and Other (See Comments)    HIVES, Desc: HIVES W/ "DYE" USED FOR 1ST CT SCAN BUT NOT 2ND, NO PREMEDS USED, PT UNCERTAIN OF CIRCUMSTANCES,,?POSSIBLE MRI CONTRAST ALLERGY, ALL STUDIES DONE "SOMEWHERE" IN [place]//A.C., Onset Date: [id number]  . Iohexol Other (See Comments)     Code: HIVES, Desc: HIVES W/ "DYE" USED FOR 1ST CT SCAN BUT NOT 2ND, NO PREMEDS USED, PT UNCERTAIN OF CIRCUMSTANCES,,?POSSIBLE MRI CONTRAST ALLERGY, ALL STUDIES DONE "SOMEWHERE" IN [place]//A.C., Onset Date: [id number]   . Iodine Hives  . Naltrexone     Other reaction(s): Unknown     Outpatient Medications Prior to Visit  Medication Sig Dispense Refill  . acetaminophen (TYLENOL) 500 MG tablet Take 1,000 mg by mouth 2 (two) times daily as needed for moderate pain or headache.    . albuterol (PROVENTIL) (2.5 MG/3ML) 0.083% nebulizer solution Take 3 mLs (2.5 mg total) by nebulization every 4 (four) hours as needed for wheezing or shortness of breath. 75 mL 2  . albuterol (VENTOLIN HFA) 108 (90 Base) MCG/ACT inhaler Inhale 2 puffs into the lungs every 6 (six) hours as needed for  wheezing or shortness of breath. 18 g 0  . amLODipine (NORVASC) 5 MG tablet Take 1 tablet (5 mg total) by mouth daily. 90 tablet 1  . apixaban (ELIQUIS) 2.5 MG TABS tablet Take 1 tablet (2.5 mg total) by mouth 2 (two) times daily. 180 tablet 3  . Ascorbic Acid (VITAMIN C) 100 MG tablet Take 100 mg by mouth daily.    Lorin Picket 1 GM 210 MG(Fe) tablet Take 2-4 mg by mouth See admin instructions. 4gm with meal 2gm with snack    . Biotin 10000 MCG TABS Take 10,000 mcg by mouth daily.     . carvedilol (COREG) 12.5 MG tablet Take 12.5 mg by mouth 2 (two) times daily with a meal.     .  doxercalciferol (HECTOROL) 4 MCG/2ML injection Doxercalciferol (Hectorol)    . meclizine (ANTIVERT) 12.5 MG tablet Take 1 tablet (12.5 mg total) by mouth 3 (three) times daily as needed for dizziness. 30 tablet 0  . multivitamin (RENA-VIT) TABS tablet Take 1 tablet by mouth at bedtime.    . mupirocin ointment (BACTROBAN) 2 % Apply 1 application topically daily as needed (skin bumps). 22 g 2  . nystatin (NYSTATIN) powder Apply 1 application topically 4 (four) times daily as needed. (Patient taking differently: Apply 1 application topically 4 (four) times daily as needed (irriation). ) 30 g 1  . ondansetron (ZOFRAN ODT) 4 MG disintegrating tablet Take 1 tablet (4 mg total) by mouth every 6 (six) hours as needed. (Patient taking differently: Take 4 mg by mouth every 6 (six) hours as needed for nausea or vomiting. ) 20 tablet 0  . sevelamer carbonate (RENVELA) 800 MG tablet Take 1,600-3,200 mg by mouth 3 (three) times daily with meals. 1600mg  with snacks 3200mg  with meals    . vitamin E (VITAMIN E) 200 UNIT capsule Take 200 Units by mouth daily.    [name] atorvastatin (LIPITOR) 10 MG tablet Take 1 tablet by mouth MWF at bedtime 45 tablet 1   No facility-administered medications prior to visit.     Radiology:   No results found.  Cardiac Studies:   PCV MYOCARDIAL PERFUSION WO LEXISCAN 11/20/2020 Lexiscan not administered at  patient's request. Incomplete study. EKG tracing not available for review. Rest images show mild decrease in tracer update in basal inferior myocardium. Consider alternate ischemia studies, if clinically indicated.  Carotid artery duplex 11/16/2020:  Stenosis in the right internal carotid artery (1-15%).  Stenosis in the left internal carotid artery (50-69%).  Antegrade right vertebral artery flow. Antegrade left vertebral artery flow.  Follow up in six months is appropriate if clinically indicated.  PCV ECHOCARDIOGRAM COMPLETE 11/16/2020 Visually, hyperdynamic LV systolic function with visual EF >70%. Left ventricle cavity is normal in size. Severe left ventricular hypertrophy.  Normal global wall motion. Unable to evaluate diastolic function due to mitral annular calcification. Elevated LAP.  Consider workup for hypertrophic cardiomyopathy if clinically indicated. Native valve, mitral annular calcification.  Systolic anterior motion of mitral valve.  Mild mitral stenosis.  Mild (Grade I) mitral regurgitation. Mild tricuspid regurgitation. No evidence of pulmonary hypertension. Moderate pulmonic regurgitation. Compared to prior study 03/26/2017: no significant change.  Nuclear stress test Lexiscan Myoview 08/05/2013: Resting EKG NSR, poor R wave progression.  Stress EKG was nondiagnostic for ischemia.  No ST-T changes of ischemia noted with pharmacologic stress testing.  Stress symptoms included shortness of breath and nausea.  Stress terminated due to completion of protocol. The perfusion study demonstrated normal isotope uptake both at rest and stress.  There was no evidence of ischemia or scar.  Dynamic gated images revealed normal wall motion and endocardial thickening.  Left ventricular ejection fraction estimated to be 75%.  No significant change from 01/03/2012.    CT neck 10/31/2011: Normal soft tissues.  Mild pulmonary artery dilation suggest hypertension.  Sleep study 2007: Sleep  apnea, noncompliant with CPAP uses supplemental oxygen only.  EKG:   11/14/2020: Sinus rhythm at a rate of 82 bpm with first-degree AV block, biatrial enlargement.  Left axis deviation, left anterior fascicular block.  Poor R wave progression, cannot exclude anteroseptal infarct old.  Cannot exclude inferior ischemia.  Incomplete right bundle branch block.  Compared to EKG 10/30/2020 (external), no significant change.  12/03/2018: Sinus tachycardia at 113 beats per  minute with first-degree AV block.  Normal axis.  PR WP cannot exclude anterior infarct old.  No evidence of ischemia.  Except for tachycardia no changes noted to EKG.  Assessment     ICD-10-CM   1. Carotid atherosclerosis, bilateral  I65.23 atorvastatin (LIPITOR) 20 MG tablet    CT CARDIAC SCORING (SELF PAY ONLY)    Lipid Panel With LDL/HDL Ratio  2. Chronic diastolic heart failure (HCC)  I50.32 CT CARDIAC SCORING (SELF PAY ONLY)  3. Essential hypertension  I10   4. Aortic atherosclerosis (HCC)  I70.0 atorvastatin (LIPITOR) 20 MG tablet    CT CARDIAC SCORING (SELF PAY ONLY)    Lipid Panel With LDL/HDL Ratio  5. Precordial pain  R07.2 CT CARDIAC SCORING (SELF PAY ONLY)     Medications Discontinued During This Encounter  Medication Reason  . atorvastatin (LIPITOR) 10 MG tablet     Meds ordered this encounter  Medications  . atorvastatin (LIPITOR) 20 MG tablet    Sig: Take 1 tablet (20 mg total) by mouth daily.    Dispense:  30 tablet    Refill:  3    Recommendations:   Whitney Walker is a 65 y.o. African-American female with history of obesity, hypertension, hyperlipidemia, tobacco use, stage IV kidney disease on dialysis, obstructive sleep apnea not on CPAP, COPD and severe peripheral arterial disease with history of left femoral-popliteal bypass surgery by Dr. Victorino Dike in 2014.  Patient is on chronic anticoagulation with Eliquis due to recurrent DVT and PE in 2014 and 2016.  Patient is followed by hematology for  hypercoagulable state.  She is currently undergoing hemodialysis on a Monday/Wednesday/Friday schedule.  Patient was evaluated in the emergency department 10/30/2020 with elevated troponin suspicious for type II MI due to underlying congestive heart failure.  Echocardiogram is unchanged compared to 2018, recommend ischemic evaluation.  Patient presents for 6-week follow-up.  Reviewed and discussed with patient regarding results of stress test, echocardiogram, and carotid artery duplex.  In view of bilateral carotid artery stenosis left greater than sign right, recommend medical management.  We will increase atorvastatin from 10 mg Monday Wednesday and Fridays to 20 mg daily.  We will repeat lipid profile testing in 3 months.  We will continue carotid artery surveillance, repeat carotid artery duplex in 6 months.  Echocardiogram is unchanged compared to 2018, with severe left ventricular hypertrophy, LVEF >67%, systolic anterior motion of mitral valve, mild mitral stenosis and regurgitation, and moderate pulmonic regurgitation.  In regard to nuclear stress testing, discussed with patient that I would recommend further ischemic evaluation in view of cardiovascular risk factors as well as precordial pain, although her symptoms are at best atypical.  [name] ASCVD estimated risk is 17%.  Discussed option to retry nuclear stress testing versus coronary CT, shared decision was to proceed with coronary CT.  Discussed with patient regarding use of contrast is not prohibitive as she is on hemodialysis.  Patient also has a history of contrast allergy, although she has done well with premedication in the past.  Will proceed with CT with premedication per radiology department.   Counseled patient regarding smoking cessation.  Provided resources including 1 800 quit now.  Also discussed regarding importance of CPAP compliance every night. [name] anticoagulation with Eliquis is currently being managed by hematology,  will defer further management to them.   In regard to patient's reported low blood pressure following dialysis, will defer further management to nephrology team.   Alethia Berthold, PA-C 01/16/2021, 3:29 PM  Office: [phone]

## 2021-01-16 ENCOUNTER — Telehealth: Payer: Medicaid Other | Admitting: Student

## 2021-01-16 ENCOUNTER — Other Ambulatory Visit: Payer: Self-pay

## 2021-01-16 ENCOUNTER — Encounter: Payer: Self-pay | Admitting: Student

## 2021-01-16 VITALS — BP 153/96

## 2021-01-16 DIAGNOSIS — R072 Precordial pain: Secondary | ICD-10-CM

## 2021-01-16 DIAGNOSIS — I10 Essential (primary) hypertension: Secondary | ICD-10-CM

## 2021-01-16 DIAGNOSIS — I5032 Chronic diastolic (congestive) heart failure: Secondary | ICD-10-CM

## 2021-01-16 DIAGNOSIS — I700 Atherosclerosis of aorta: Secondary | ICD-10-CM

## 2021-01-16 DIAGNOSIS — I6523 Occlusion and stenosis of bilateral carotid arteries: Secondary | ICD-10-CM

## 2021-01-16 MED ORDER — ATORVASTATIN CALCIUM 20 MG PO TABS: 20.0000 mg | ORAL_TABLET | Freq: Every day | ORAL | 3 refills | Status: DC

## 2021-01-17 ENCOUNTER — Telehealth: Payer: Self-pay

## 2021-01-21 NOTE — Chronic Care Management (AMB) (Signed)
Chronic Care Management Pharmacy Assistant   Name: Whitney Walker  MRN: [id number] DOB: August 23, 1956  Reason for Encounter: Medication Review  PCP : [name], [place]   01/17/2021- Patient called with considering in changing pharmacies from [facility]. Patient states she does not like instructions that were on her vial medication, did not tell which medication but she just had some dislikes on it and was going to inquire with her insurance on another mail order pharmacy. Tried to discuss with patient that [facility] can fix what prescription instructions with no problems if she wanted to stay, they only put on labels what the provider prescribed. Patient states that she will look into it and let me know.  Patient also started to express her concerns regarding her transportation and dialysis treatments. Patient states a week ago she missed 2 treatments due to transportation not picking her up. Patient stated she went to her Friday dialysis treatment but missed her Monday and Wednesday treatment due to transportation. She was very upset, she got very sick and was concerned that her kidney's would fail due to the back up of infection she felt she had. So her Friday dialysis was rough and her blood pressure dropped so low that she almost passed out.   Patient also mention that her blood pressures are more elevated at home now due to being taken off of Amlodipine but drops really low after dialysis treatments. One blood pressure reading 153/59. Patient aware that I will contact dialysis center and Nephrologist for updated office notes to send to PCP to review regarding blood pressures.  01/24/2021- Patient called stating Dr [name] asked her who was managing her Carvedilol prescription and patient wasn't sure, she thought [name], FNP was managing. She informed me that Dr [name] suggested she stopped taking the Carvedilol due to it possibly causing her blood pressure to drop during dialysis  treatments. Patient was upset because she didn't know what to do or if she should stop taking it. She would only be on Eliquis and Atorvastatin. Called Dr Jason Nest office, I was transferred to the Dialysis Center and spoke with staff who did not see that documentation from Dr Jason Nest visit today with patient mentioning Carvedilol, message was left for Dr [name] to call PCP to discuss.   01/25/2021- Patient called again concerned about her blood pressure going up since being off Carvediolol, last reading 163/? Patient could not remember diastolic number. Reviewed chart and [facility] pharmacy profile, looks like the last provider who filled medication was [facility] and when I researched provider name, [facility] was the office this provider was attached to. Patient stated this was Dr Jason Nest office. Called Dr Jason Nest office to discuss Carvedilol prescription in which they manage and Dr Jason Nest suggestions. Left message with nurse Amber to return call. Patient stated at this point she was not going to take Carvedilol and will just monitor her blood pressures. Patient aware I sent a message to [name], CPP to discuss with PCP.   Allergies:   Allergies  Allergen Reactions   Carnosine     Other reaction(s): Unknown   Gadolinium Derivatives Hives and Other (See Comments)    HIVES, Desc: HIVES W/ "DYE" USED FOR 1ST CT SCAN BUT NOT 2ND, NO PREMEDS USED, PT UNCERTAIN OF CIRCUMSTANCES,,?POSSIBLE MRI CONTRAST ALLERGY, ALL STUDIES DONE "SOMEWHERE" IN [place]//A.C., Onset Date: [id number]   Iohexol Other (See Comments)     Code: HIVES, Desc: HIVES W/ "DYE" USED FOR 1ST CT SCAN  BUT NOT 2ND, NO PREMEDS USED, PT UNCERTAIN OF CIRCUMSTANCES,,?POSSIBLE MRI CONTRAST ALLERGY, ALL STUDIES DONE "SOMEWHERE" IN [place]//A.C., Onset Date: [id number]    Iodine Hives   Naltrexone     Other reaction(s): Unknown    Medications: Outpatient Encounter Medications as of 01/17/2021  Medication Sig Note    acetaminophen (TYLENOL) 500 MG tablet Take 1,000 mg by mouth 2 (two) times daily as needed for moderate pain or headache.    albuterol (PROVENTIL) (2.5 MG/3ML) 0.083% nebulizer solution Take 3 mLs (2.5 mg total) by nebulization every 4 (four) hours as needed for wheezing or shortness of breath.    albuterol (VENTOLIN HFA) 108 (90 Base) MCG/ACT inhaler Inhale 2 puffs into the lungs every 6 (six) hours as needed for wheezing or shortness of breath.    amLODipine (NORVASC) 5 MG tablet Take 1 tablet (5 mg total) by mouth daily.    apixaban (ELIQUIS) 2.5 MG TABS tablet Take 1 tablet (2.5 mg total) by mouth 2 (two) times daily.    Ascorbic Acid (VITAMIN C) 100 MG tablet Take 100 mg by mouth daily.    atorvastatin (LIPITOR) 20 MG tablet Take 1 tablet (20 mg total) by mouth daily.    AURYXIA 1 GM 210 MG(Fe) tablet Take 2-4 mg by mouth See admin instructions. 4gm with meal 2gm with snack 10/30/2020: Has not started, starting when she leaves hospital to get prescription   Biotin 10000 MCG TABS Take 10,000 mcg by mouth daily.     carvedilol (COREG) 12.5 MG tablet Take 12.5 mg by mouth 2 (two) times daily with a meal.     doxercalciferol (HECTOROL) 4 MCG/2ML injection Doxercalciferol (Hectorol)    meclizine (ANTIVERT) 12.5 MG tablet Take 1 tablet (12.5 mg total) by mouth 3 (three) times daily as needed for dizziness.    multivitamin (RENA-VIT) TABS tablet Take 1 tablet by mouth at bedtime.    mupirocin ointment (BACTROBAN) 2 % Apply 1 application topically daily as needed (skin bumps).    nystatin (NYSTATIN) powder Apply 1 application topically 4 (four) times daily as needed. (Patient taking differently: Apply 1 application topically 4 (four) times daily as needed (irriation). )    ondansetron (ZOFRAN ODT) 4 MG disintegrating tablet Take 1 tablet (4 mg total) by mouth every 6 (six) hours as needed. (Patient taking differently: Take 4 mg by mouth every 6 (six) hours as needed for nausea or  vomiting. )    sevelamer carbonate (RENVELA) 800 MG tablet Take 1,600-3,200 mg by mouth 3 (three) times daily with meals. 1600mg  with snacks 3200mg  with meals 10/30/2020: DC as of 10/30/2020 & start ayruxia   vitamin E (VITAMIN E) 200 UNIT capsule Take 200 Units by mouth daily.    No facility-administered encounter medications on file as of 01/17/2021.    Current Diagnosis: Patient Active Problem List   Diagnosis Date Noted   Hyperkalemia 05/09/2020   Acute respiratory failure with hypoxia ([place]) 05/09/2020   Left leg pain 05/09/2020   Restless leg syndrome 09/19/2018   OSA (obstructive sleep apnea) 04/29/2018   ESRD on dialysis ([place]) 04/29/2018   Dyspnea on exertion 03/12/2017   Morbid (severe) obesity due to excess calories ([place]) 03/12/2017   Anemia of chronic kidney failure, stage 3 (moderate) ([place]) 12/10/2016   History of colon cancer 08/12/2016   Mass of left breast on mammogram 12/10/2015   HTN (hypertension) 10/18/2015   Depression 10/18/2015   Numbness and tingling of left arm and leg 01/24/2015   PVD (peripheral vascular disease) with  claudication ([place]) 03/23/2014   PAD (peripheral artery disease) ([place]) 12/14/2013   Chronic diastolic heart failure ([place]) 11/15/2013   Essential hypertension 11/15/2013   Leucocytosis 04/28/2012    Follow-Up:  Pharmacist Review  01/26/2021- Called patient to follow up and see how blood pressure are doing, no answer, left message to return call.  [name], CPP notified.  [name], [place] Pharmacist Assistant [phone]

## 2021-02-01 ENCOUNTER — Telehealth: Payer: Self-pay

## 2021-02-01 NOTE — Chronic Care Management (AMB) (Signed)
Chronic Care Management Pharmacy Assistant   Name: Whitney Walker  MRN: [id number] DOB: [birth year]  Reason for Encounter: Medication Review  Patient Questions:  1.  Have you seen any other providers since your last visit? Yes, 1/18/2022Lawer[facility] Cruel (Cardiology); [facility]  2.  Any changes in your medicines or health? Yes, 02/06/2021- Oxycodone 5/325 mg started     PCP : [name], FNP  Allergies:   Allergies  Allergen Reactions   Carnosine     Other reaction(s): Unknown   Gadolinium Derivatives Hives and Other (See Comments)    HIVES, Desc: HIVES W/ "DYE" USED FOR 1ST CT SCAN BUT NOT 2ND, NO PREMEDS USED, PT UNCERTAIN OF CIRCUMSTANCES,,?POSSIBLE MRI CONTRAST ALLERGY, ALL STUDIES DONE "SOMEWHERE" IN [place]//A.C., Onset Date: [id number]   Iohexol Other (See Comments)     Code: HIVES, Desc: HIVES W/ "DYE" USED FOR 1ST CT SCAN BUT NOT 2ND, NO PREMEDS USED, PT UNCERTAIN OF CIRCUMSTANCES,,?POSSIBLE MRI CONTRAST ALLERGY, ALL STUDIES DONE "SOMEWHERE" IN [place]//A.C., Onset Date: [id number]    Iodine Hives   Naltrexone     Other reaction(s): Unknown    Medications: Outpatient Encounter Medications as of 02/01/2021  Medication Sig Note   acetaminophen (TYLENOL) 500 MG tablet Take 1,000 mg by mouth 2 (two) times daily as needed for moderate pain or headache.    albuterol (PROVENTIL) (2.5 MG/3ML) 0.083% nebulizer solution Take 3 mLs (2.5 mg total) by nebulization every 4 (four) hours as needed for wheezing or shortness of breath.    albuterol (VENTOLIN HFA) 108 (90 Base) MCG/ACT inhaler Inhale 2 puffs into the lungs every 6 (six) hours as needed for wheezing or shortness of breath.    amLODipine (NORVASC) 5 MG tablet Take 1 tablet (5 mg total) by mouth daily.    apixaban (ELIQUIS) 2.5 MG TABS tablet Take 1 tablet (2.5 mg total) by mouth 2 (two) times daily.    Ascorbic Acid (VITAMIN C) 100 MG tablet Take 100 mg by mouth daily.    atorvastatin  (LIPITOR) 20 MG tablet Take 1 tablet (20 mg total) by mouth daily.    AURYXIA 1 GM 210 MG(Fe) tablet Take 2-4 mg by mouth See admin instructions. 4gm with meal 2gm with snack 10/30/2020: Has not started, starting when she leaves hospital to get prescription   Biotin 10000 MCG TABS Take 10,000 mcg by mouth daily.     carvedilol (COREG) 12.5 MG tablet Take 12.5 mg by mouth 2 (two) times daily with a meal.     doxercalciferol (HECTOROL) 4 MCG/2ML injection Doxercalciferol (Hectorol)    meclizine (ANTIVERT) 12.5 MG tablet Take 1 tablet (12.5 mg total) by mouth 3 (three) times daily as needed for dizziness.    multivitamin (RENA-VIT) TABS tablet Take 1 tablet by mouth at bedtime.    mupirocin ointment (BACTROBAN) 2 % Apply 1 application topically daily as needed (skin bumps).    nystatin (NYSTATIN) powder Apply 1 application topically 4 (four) times daily as needed. (Patient taking differently: Apply 1 application topically 4 (four) times daily as needed (irriation). )    ondansetron (ZOFRAN ODT) 4 MG disintegrating tablet Take 1 tablet (4 mg total) by mouth every 6 (six) hours as needed. (Patient taking differently: Take 4 mg by mouth every 6 (six) hours as needed for nausea or vomiting. )    sevelamer carbonate (RENVELA) 800 MG tablet Take 1,600-3,200 mg by mouth 3 (three) times daily with meals. 1600mg  with snacks 3200mg  with meals 10/30/2020: DC as of  10/30/2020 & start ayruxia   vitamin E (VITAMIN E) 200 UNIT capsule Take 200 Units by mouth daily.    No facility-administered encounter medications on file as of 02/01/2021.    Current Diagnosis: Patient Active Problem List   Diagnosis Date Noted   Hyperkalemia 05/09/2020   Acute respiratory failure with hypoxia ([place]) 05/09/2020   Left leg pain 05/09/2020   Restless leg syndrome 09/19/2018   OSA (obstructive sleep apnea) 04/29/2018   ESRD on dialysis ([place]) 04/29/2018   Dyspnea on exertion 03/12/2017   Morbid (severe) obesity  due to excess calories ([place]) 03/12/2017   Anemia of chronic kidney failure, stage 3 (moderate) ([place]) 12/10/2016   History of colon cancer 08/12/2016   Mass of left breast on mammogram 12/10/2015   HTN (hypertension) 10/18/2015   Depression 10/18/2015   Numbness and tingling of left arm and leg 01/24/2015   PVD (peripheral vascular disease) with claudication ([place]) 03/23/2014   PAD (peripheral artery disease) ([place]) 12/14/2013   Chronic diastolic heart failure ([place]) 11/15/2013   Essential hypertension 11/15/2013   Leucocytosis 04/28/2012   Reviewed chart for medication changes ahead of medication coordination call.  Consults, hospital visits- 01/16/2021- [facility] Cruel (Cardiology); [facility]- [facility]  since last care coordination call/Pharmacist visit.   Medication changes indicated: 02/06/2021- Oxycodone 5/325 mg started.    BP Readings from Last 3 Encounters:  01/16/21 (!) 153/96  11/14/20 133/75  11/07/20 124/80    Lab Results  Component Value Date   HGBA1C 5.1 06/04/2018     Patient obtains medications through [facility]  90 Days   Last adherence delivery included: None, patient contacted pharmacy on 12/11/2020 to request refills of Coreg 12.5 mg, Eliquis 2.5 mg and Amlodipine 5 mg.  Patient declined the following medication last month: Albuterol inhaler 108 mcg- inhale 2 puffs every 6 hours due to PRN use.  Carvedilol 12.5 mg- 1 tablet daily on Mon, Wed, and Fridays and 1 tablet twice daily on Tues, Thurs, Sat and Sundays due to receiving a 84 days supply on 12/11/2020.  Eliquis 2.5 mg-1 tablet by mouth twice a day Amlodipine 5 mg- 1 tablet daily                         Due to receiving a 90 day supply on 12/11/2020.  Lunesta 2 mg- 1 tablet nightly due to not helping with Insomniapatient discontinued Nystatin100 gm- apply four times a day as needed. Meclizine12.5 mg- 1 tablet three times a day for dizziness due to PRN use. Ondansetron 4 mg-  Dissolve 1 tablet under tongue every 6 hours due to PRN use.   Patient is due for next adherence delivery on: 02/07/2021 . Called patient and reviewed medications and coordinated delivery.  This delivery to include: None- Patient does not need any refills at time. Patient has restarted Carvedilol but has an adequate supply, 84 day supply sent on 12/11/2020 and patient does not take 2 tablet daily, only one on dialysis days. Patient stopped taking Atorvastatin due to side effects, she stated the increase of 20 mg gave her headaches and caused her chest pains. Patient received prescription for Oxycodone 5/325 mg on 02/06/2021.  No short fill or acute fill needed.   Patient declined the following medications: Albuterol inhaler 108 mcg- inhale 2 puffs every 6 hours due to PRN use.  Carvedilol 12.5 mg- 1 tablet daily on Mon, Wed, and Fridays and 1 tablet twice daily on Tues, Thurs, Sat and Sundays due  to receiving a 84 days supply on 12/11/2020.  Eliquis 2.5 mg-1 tablet by mouth twice a day due to receiving a 90 day supply on 12/11/2020.  Amlodipine 5 mg- 1 tablet daily due to being discontinued by Nephrologist during Diaylsis treatment.     Lunesta 2 mg- 1 tablet nightly due to not helping with Insomniapatient discontinued Nystatin100 gm- apply four times a day as needed. Meclizine12.5 mg- 1 tablet three times a day for dizziness due to PRN use. Ondansetron 4 mg- Dissolve 1 tablet under tongue every 6 hours due to PRN use.  Patient needs refills for None.  Patient aware I will follow up with her next month to review medication and coordinate delivery.   Follow-Up:  Coordination of Enhanced Pharmacy Services and Pharmacist Review  Patient reported on 02/01/2021- That she started having breast/chest pain but when she laid down she started feeling better, she thinks it is from the increase of Atorvastatin from 10 mg to 20 mg that she is having a side effect, she was also having a headache  so she stopped taking medication. Patient also was informed by [facility] that her Potassium levels were at 7.2 and at her next dialysis treatment she will have to stay under treatment 15 minutes longer to get more fluids. Patient know that it was from what she was eating. Patient states he made a chocolate cake for her daughter and ate too much of that. Patient does eat canned soup. Patient will notify Cardiology on stopping Atorvastatin 20 mg. Patient decided to stay with [facility] but does not need any refills at this time. Patient is scheduled for a CT Cardiac Score this month with cost her $99. No fill form will be uploaded to CPP and Pharmacy for review.  [name], CPP notified.  02/06/2021- Before closing out my note I noticed patient was at the ED. Called patient to check on her. Spoke with patient, she went to [facility] ED with chest pains, they could not find anything wrong, undetermined chest pains, Cardiac enzymes were all normal, and she was discharge with Oxycodone 5/325 mg which patient states has helped. Patient reported she has quit smoking for the past 2 days. Patient reported that Dr [name]- Nephrologist is putting her on the list for a kidney transplant so she will be going through various test and screenings for a while until she is chosen for transplant. Patient is feeling better and appreciates the call.  [name], CPP notified.  [name], [place] Pharmacist Assistant [phone]

## 2021-02-05 ENCOUNTER — Other Ambulatory Visit: Payer: Self-pay

## 2021-02-05 ENCOUNTER — Emergency Department (HOSPITAL_COMMUNITY): Payer: 59

## 2021-02-05 ENCOUNTER — Emergency Department (HOSPITAL_COMMUNITY)
Admission: EM | Admit: 2021-02-05 | Discharge: 2021-02-06 | Disposition: A | Discharge status: Home / Self Care | Payer: 59 | Attending: Emergency Medicine | Admitting: Emergency Medicine

## 2021-02-05 DIAGNOSIS — Z85038 Personal history of other malignant neoplasm of large intestine: Secondary | ICD-10-CM | POA: Insufficient documentation

## 2021-02-05 DIAGNOSIS — Z79899 Other long term (current) drug therapy: Secondary | ICD-10-CM | POA: Insufficient documentation

## 2021-02-05 DIAGNOSIS — I2510 Atherosclerotic heart disease of native coronary artery without angina pectoris: Secondary | ICD-10-CM | POA: Insufficient documentation

## 2021-02-05 DIAGNOSIS — Z992 Dependence on renal dialysis: Secondary | ICD-10-CM | POA: Insufficient documentation

## 2021-02-05 DIAGNOSIS — I120 Hypertensive chronic kidney disease with stage 5 chronic kidney disease or end stage renal disease: Secondary | ICD-10-CM | POA: Insufficient documentation

## 2021-02-05 DIAGNOSIS — Z7901 Long term (current) use of anticoagulants: Secondary | ICD-10-CM | POA: Insufficient documentation

## 2021-02-05 DIAGNOSIS — R0789 Other chest pain: Secondary | ICD-10-CM | POA: Diagnosis not present

## 2021-02-05 DIAGNOSIS — F17210 Nicotine dependence, cigarettes, uncomplicated: Secondary | ICD-10-CM | POA: Insufficient documentation

## 2021-02-05 DIAGNOSIS — J449 Chronic obstructive pulmonary disease, unspecified: Secondary | ICD-10-CM | POA: Insufficient documentation

## 2021-02-05 DIAGNOSIS — R059 Cough, unspecified: Secondary | ICD-10-CM | POA: Insufficient documentation

## 2021-02-05 DIAGNOSIS — R079 Chest pain, unspecified: Secondary | ICD-10-CM

## 2021-02-05 DIAGNOSIS — N186 End stage renal disease: Secondary | ICD-10-CM | POA: Insufficient documentation

## 2021-02-05 LAB — TROPONIN I (HIGH SENSITIVITY)
Troponin I (High Sensitivity): 17 ng/L (ref <18)
Troponin I (High Sensitivity): 17 ng/L (ref <18)

## 2021-02-05 LAB — CBC
HCT: 37.6 % (ref 36.0–46.0)
Hemoglobin: 11.7 g/dL — ABNORMAL LOW (ref 12.0–15.0)
MCH: 28.2 pg (ref 26.0–34.0)
MCHC: 31.1 g/dL (ref 30.0–36.0)
MCV: 90.6 fL (ref 80.0–100.0)
Platelets: 240 10*3/uL (ref 150–400)
RBC: 4.15 MIL/uL (ref 3.87–5.11)
RDW: 15.3 % (ref 11.5–15.5)
WBC: 13.7 10*3/uL — ABNORMAL HIGH (ref 4.0–10.5)
nRBC: 0 % (ref 0.0–0.2)

## 2021-02-05 LAB — BASIC METABOLIC PANEL
Anion gap: 13 (ref 5–15)
BUN: 16 mg/dL (ref 8–23)
CO2: 29 mmol/L (ref 22–32)
Calcium: 8.7 mg/dL — ABNORMAL LOW (ref 8.9–10.3)
Chloride: 93 mmol/L — ABNORMAL LOW (ref 98–111)
Creatinine, Ser: 4.81 mg/dL — ABNORMAL HIGH (ref 0.44–1.00)
GFR, Estimated: 9 mL/min — ABNORMAL LOW (ref >60)
Glucose, Bld: 96 mg/dL (ref 70–99)
Potassium: 4.3 mmol/L (ref 3.5–5.1)
Sodium: 135 mmol/L (ref 135–145)

## 2021-02-05 IMAGING — DX DG CHEST 2V
2 series · 2 of 2 positions shown · non-contrast
Comparison: Chest radiograph dated [DATE].

CLINICAL DATA: 64-year-old female with chest pain.

EXAM:
CHEST - 2 VIEW

[chest pa]
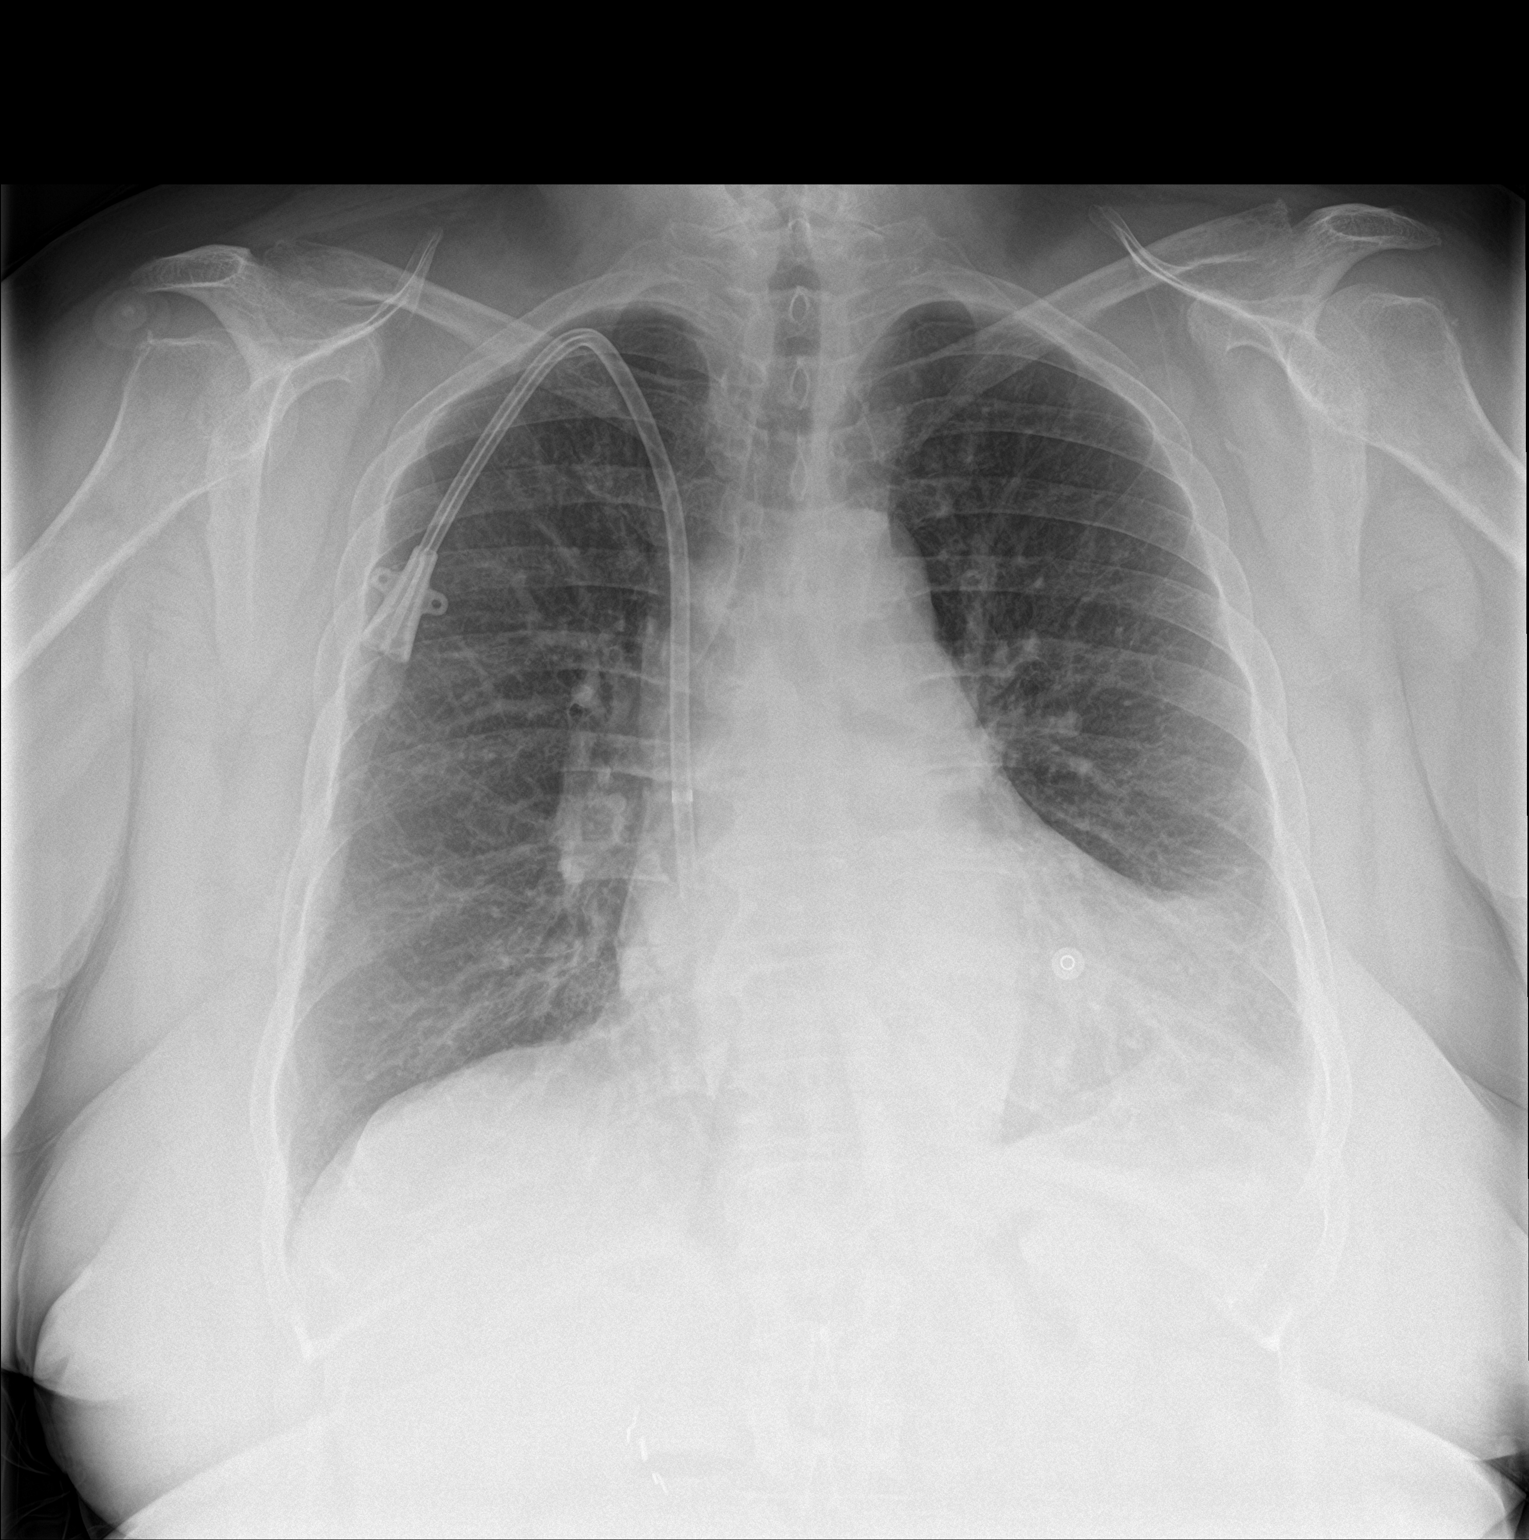

[chest lat]
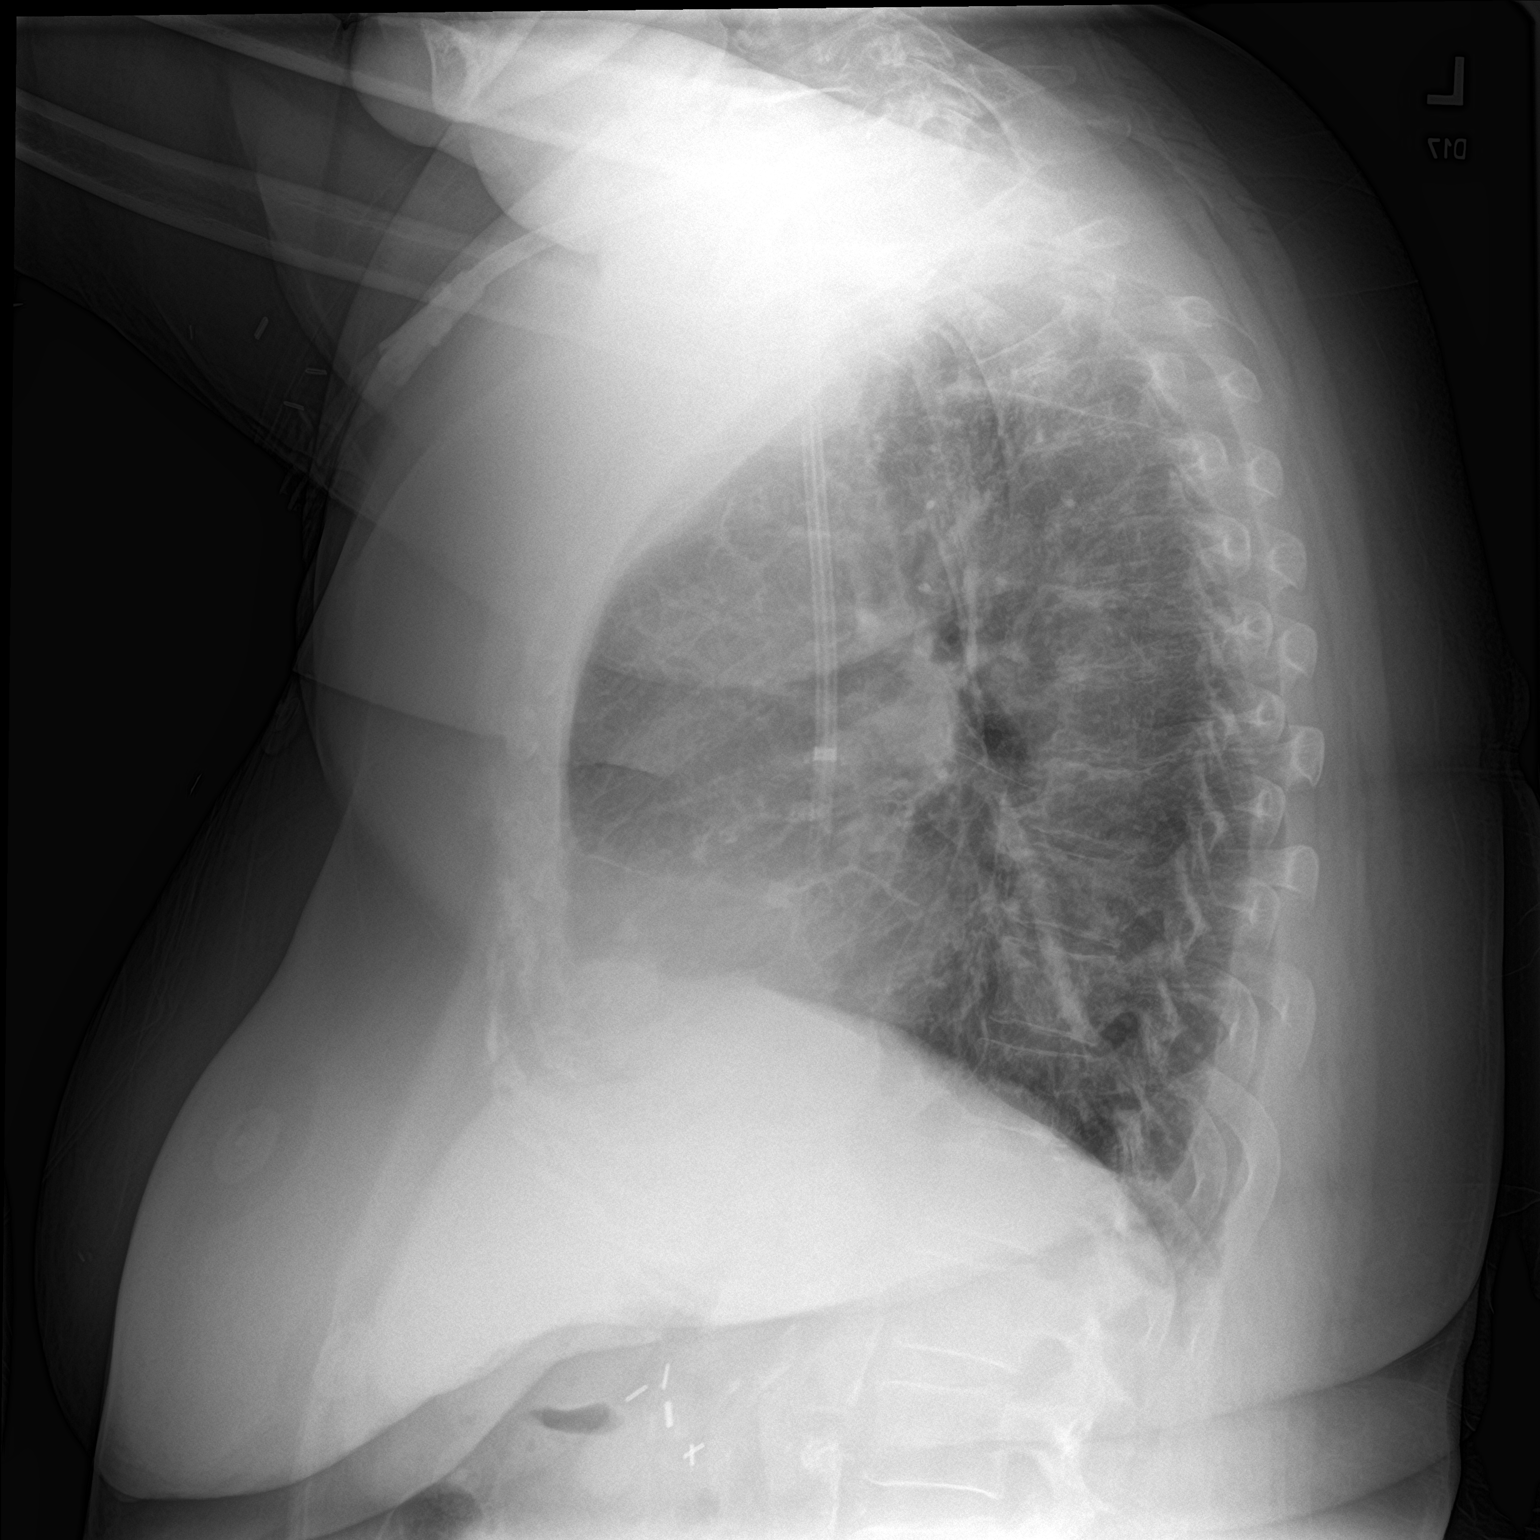

[2 of 2 positions shown; findings below may reference images not displayed]

FINDINGS: Dialysis catheter in similar position. No focal consolidation,
pleural effusion, pneumothorax. Mild cardiomegaly. No acute osseous
pathology.
IMPRESSION: No active cardiopulmonary disease.

## 2021-02-05 NOTE — ED Triage Notes (Signed)
c/o intermittent CP x 3 days; worst today; NTG 0.4 sl x1 + asa 324 mg given en route; hx of blood clots in lungs and legs; currently on eliquis. Described pain now as constant and stabbing; 250 ml bolus given as well d/t decreased BP post NTG.

## 2021-02-06 ENCOUNTER — Emergency Department (HOSPITAL_COMMUNITY): Payer: 59

## 2021-02-06 ENCOUNTER — Inpatient Hospital Stay: Admission: RE | Admit: 2021-02-06 | Payer: Medicare Other | Source: Ambulatory Visit

## 2021-02-06 DIAGNOSIS — R0789 Other chest pain: Secondary | ICD-10-CM | POA: Diagnosis not present

## 2021-02-06 IMAGING — CT CT CHEST W/O CM
2 of 4 series · 15 of 36 positions shown, 18 images · non-contrast
Comparison: Low-dose screening chest CT [DATE]. Chest
radiographs [DATE].

CLINICAL DATA: 64-year-old female with emphysema. Chest pain for 3
days. On Eliquis for history of blood clots.

EXAM:
CT CHEST WITHOUT CONTRAST
TECHNIQUE: Multidetector CT imaging of the chest was performed following the
standard protocol without IV contrast.

[Series 3: chest w/o 2mm st · axial · non-contrast · 0.81mm/px · z∈[+1102,+1336]mm · 12 of 137 slices shown, 15 images]
[im 10/137  mediastinal]
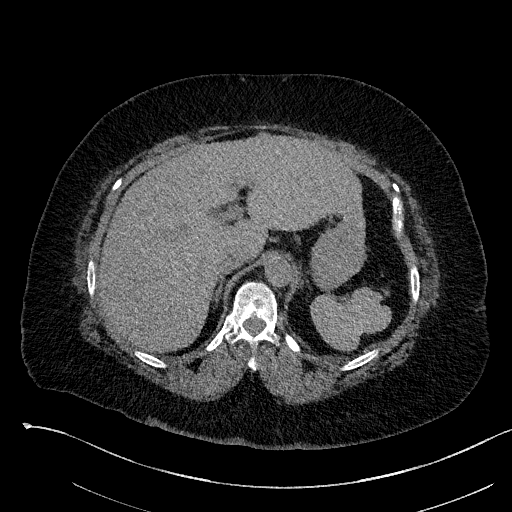
[im 10/137  lung]
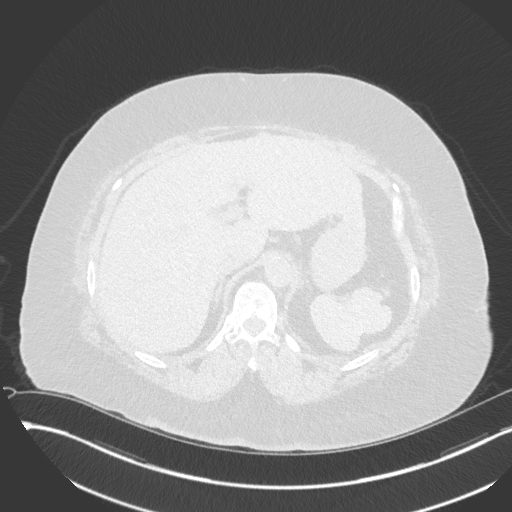
[im 20/137  lung]
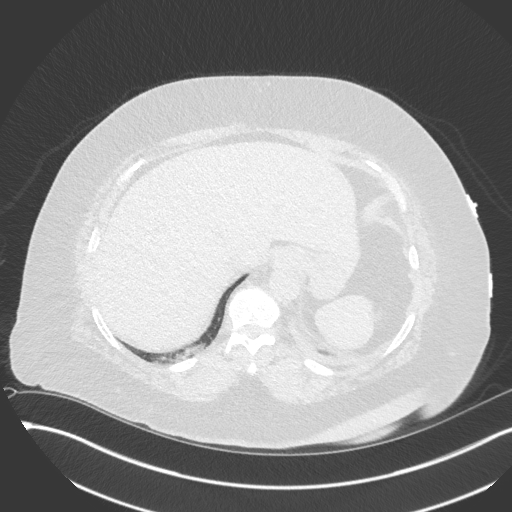
[im 30/137  lung]
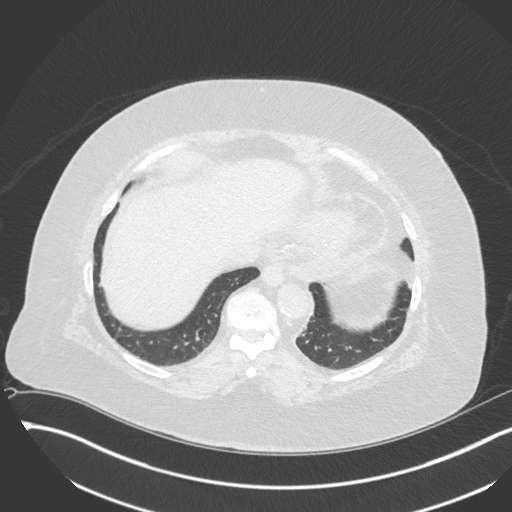
[im 39/137  lung]
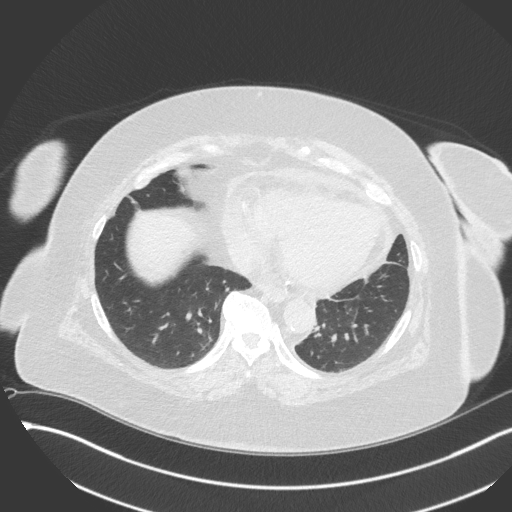
[im 49/137  mediastinal]
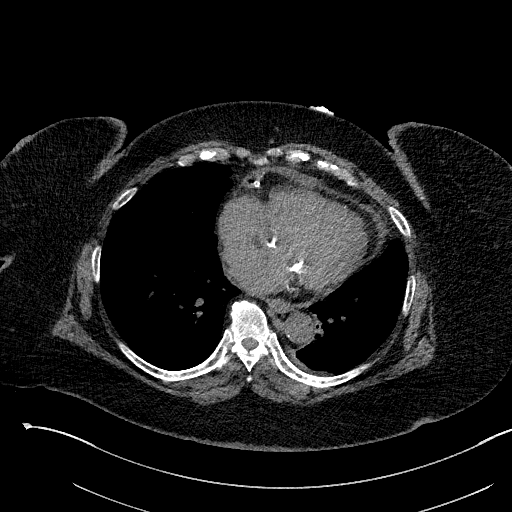
[im 49/137  lung]
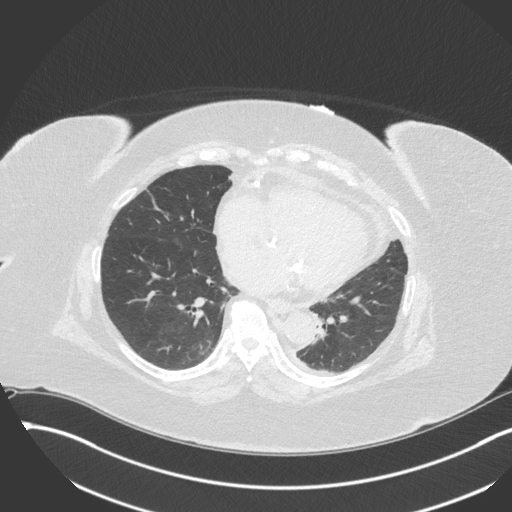
[im 59/137  lung]
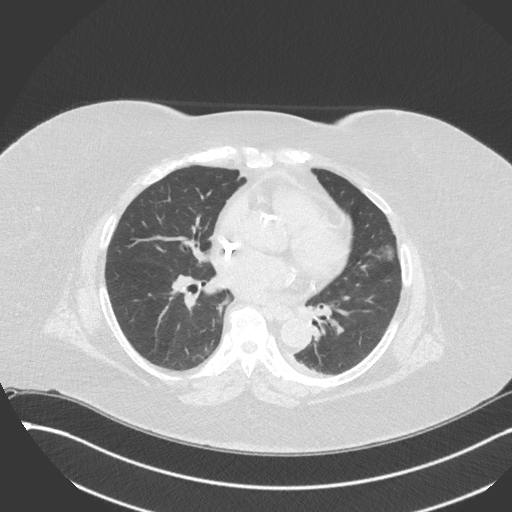
[im 78/137  lung]
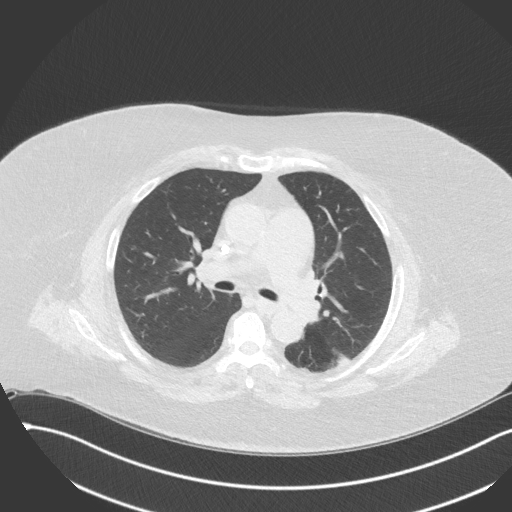
[im 88/137  lung]
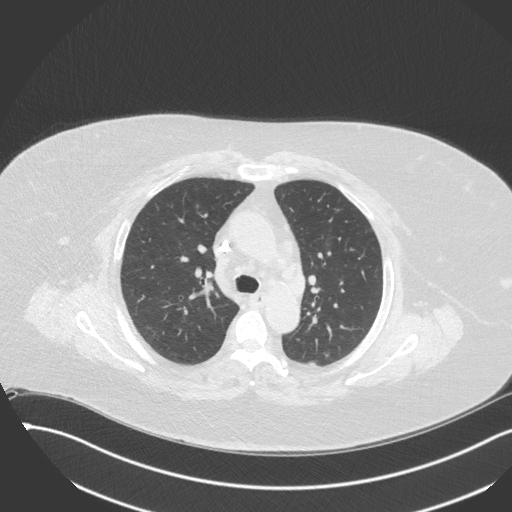
[im 98/137  mediastinal]
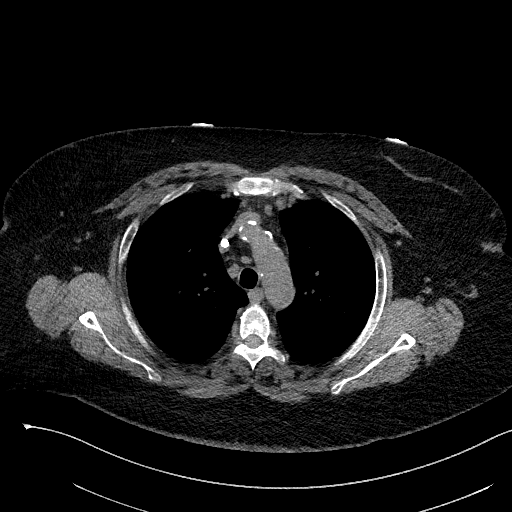
[im 98/137  lung]
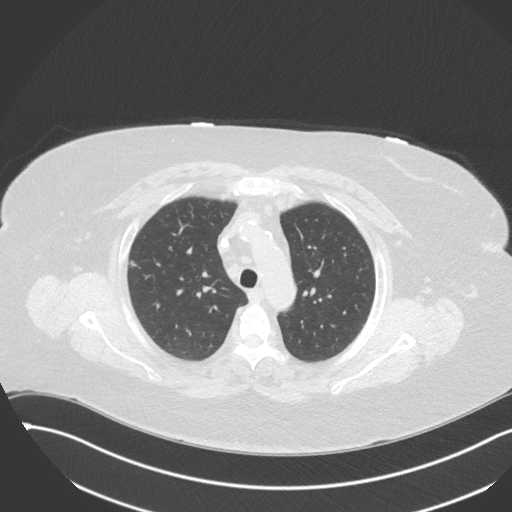
[im 107/137  lung]
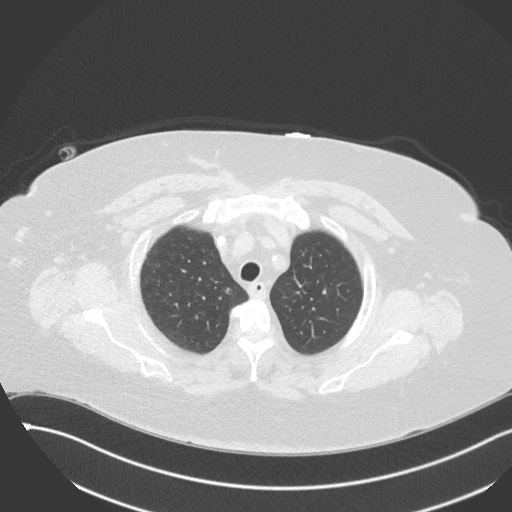
[im 117/137  lung]
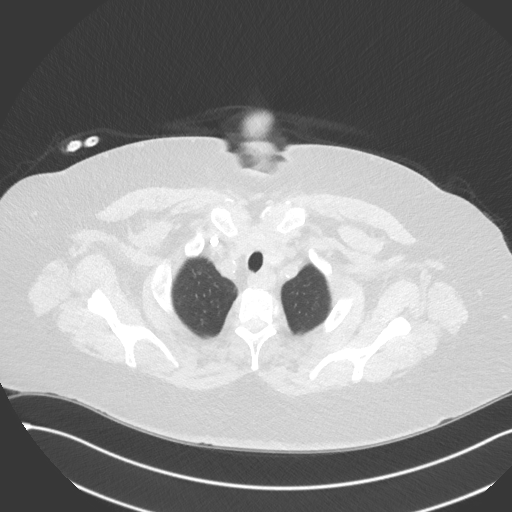
[im 127/137  lung]
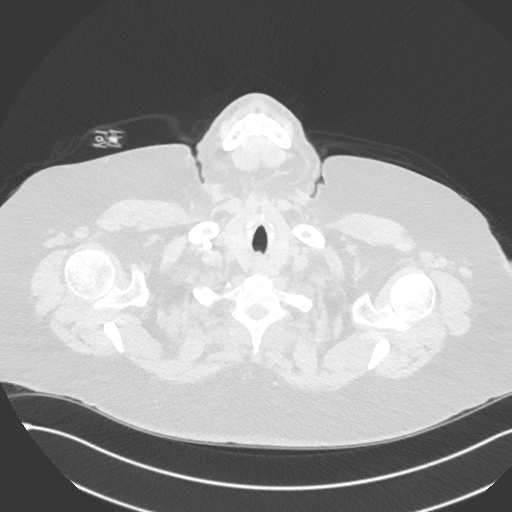

[Series 5: chest w/o 2mm st cor · coronal · non-contrast · 0.59mm/px · 3 of 150 slices shown]
[im 30/150  lung]
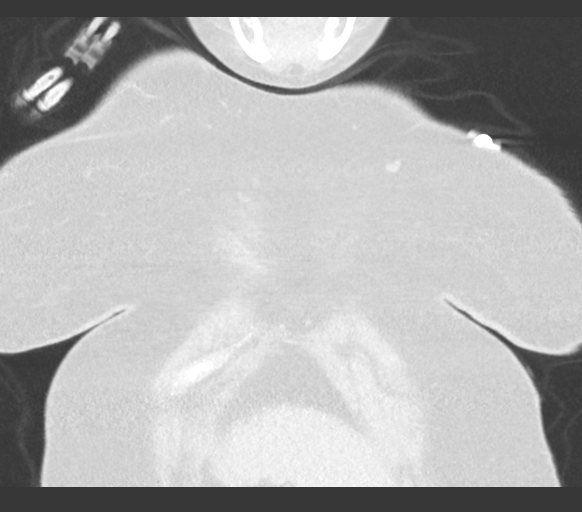
[im 60/150  lung]
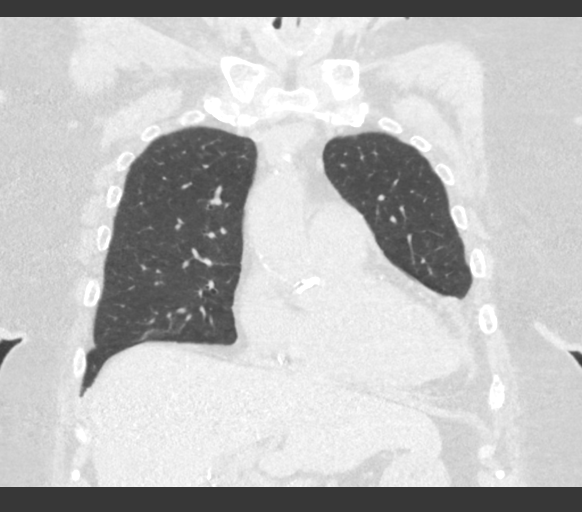
[im 90/150  lung]
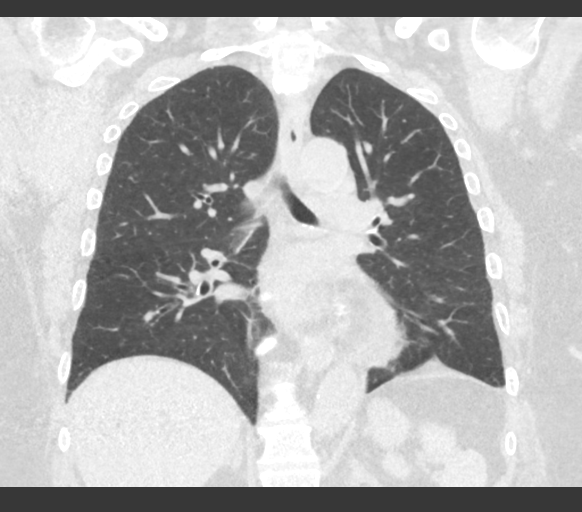

[15 of 36 positions shown; findings below may reference images not displayed]

FINDINGS: Cardiovascular: Extensive calcified coronary artery atherosclerosis
(series 3, image 75). No cardiomegaly or pericardial effusion.
Calcified aortic atherosclerosis. Vascular patency is not evaluated
in the absence of IV contrast.

Right chest dual lumen dialysis type catheter in place.

Mediastinum/Nodes: Thyroid goiter with retrosternal extension to the
pre-vascular space and inseparable from the upper thoracic esophagus
appears stable. Small mediastinal lymph nodes are stable, within
normal limits.

Lungs/Pleura: Mildly lower lung volumes. Dependent opacity in the
left lung most resembles atelectasis, with some superimposed stable
subpleural scarring. Trace retained secretions in the lower trachea
on the right. Major airways are patent. Minimal atelectasis in the
right costophrenic angle. Mild scarring in the right middle lobe.
Tiny right upper lobe lung nodules have not significantly changed
since [DATE], image 40). Similar subtle nodularity in
the left upper lobe appears stable. A left lower lobe lung nodule
measuring 4-5 mm is questionably larger since [REDACTED] on series 7,
image 82.

No other acute pulmonary finding.

Upper Abdomen: Absent gallbladder. Negative visible noncontrast
liver, spleen, adrenal glands and stomach.

Musculoskeletal: Widespread chronic vacuum disc. No acute osseous
abnormality identified.
IMPRESSION: 1. A 4-5 mm left lower lobe lung nodule is questionably larger since
a screening Chest CT in [REDACTED].
Other small bilateral pulmonary nodules appear stable.
Mild atelectasis but no other new pulmonary abnormality.
Recommend a repeat Chest CT in [REDACTED] (perfer WITHOUT CONTRAST
LOW-DOSE LUNG CANCER SCREENING protocol).

2. Severe calcified coronary artery atherosclerosis. Aortic
Atherosclerosis ([QP]-[QP]).

3. Thyroid goiter with some retrosternal extension on the left is
suspected and stable.

## 2021-02-06 MED ORDER — IBUPROFEN 400 MG PO TABS
400.0000 mg | ORAL_TABLET | Freq: Once | ORAL | Status: AC | PRN
  Administered 2021-02-06: 400 mg via ORAL
  Filled 2021-02-06: qty 1

## 2021-02-06 MED ORDER — OXYCODONE-ACETAMINOPHEN 5-325 MG PO TABS: 2.0000 | ORAL_TABLET | Freq: Three times a day (TID) | ORAL | 0 refills | Status: DC | PRN

## 2021-02-06 MED ORDER — OXYCODONE-ACETAMINOPHEN 5-325 MG PO TABS
2.0000 | ORAL_TABLET | Freq: Once | ORAL | Status: AC
  Administered 2021-02-06: 2 via ORAL
  Filled 2021-02-06: qty 2

## 2021-02-06 NOTE — ED Notes (Signed)
Refused VS reassessment

## 2021-02-06 NOTE — ED Notes (Signed)
Pt up to sort desk says she is in pain and leaving, she had already asked tech to removed her IV. She says she already has looked at her labs and is leaving. Discussed with pt wait, offered her ibuprofen for pain, pt agreeable to vitals and ibuprofen at this time

## 2021-02-06 NOTE — ED Provider Notes (Signed)
[facility] EMERGENCY DEPARTMENT Provider Note   CSN: [id number] Arrival date & time: 02/05/21  1511     History Chief Complaint  Patient presents with  . Chest Pain    Whitney Walker is a 65 y.o. female.  Patient with pain that started in her left shoulder.  It radiated down to her left chest.  Then it moved to her left chest.  At this time patient states it is underneath her left breast.  It hurts worse when she moves her arm, pushes on it, Uses her arm takes a deep breath or any other movement of her chest.  At total rest she has no symptoms.  She has no shortness of breath.  She has no pain elsewhere.  Compliant with dialysis.  Compliant with medications. H/o PE but this is not similar (except the pleuritic component.) but is more similar to when she had rib fractures. No trauma. otherwise stable.    Chest Pain Associated symptoms: cough (nonproductive)        Past Medical History:  Diagnosis Date  . Anemia   . Anxiety   . CHF (congestive heart failure) ([place])   . Colon cancer [facility])    treatment surgery  . Complication of anesthesia    after first C- [place] "couldnt walk after", patient denies having a spinal  . COPD (chronic obstructive pulmonary disease) ([place])   . Coronary artery disease   . Depression   . DVT (deep venous thrombosis) ([place])   . ESRD (end stage renal disease) ([place])    Hemo: MWF  . History of blood transfusion 04/2018  . Hypertension    07/07/18- no longer takes BP medications  . Meningitis   . Pain in limb 07/30/2013  . PE (pulmonary embolism)   . Peripheral vascular disease ([place])   . Restless legs   . Shortness of breath    with exertion  . Sleep apnea   . SOB (shortness of breath) 03/03/2019  . Vertigo     Patient Active Problem List   Diagnosis Date Noted  . Hyperkalemia 05/09/2020  . Acute respiratory failure with hypoxia ([place]) 05/09/2020  . Left leg pain 05/09/2020  . Restless leg syndrome 09/19/2018  . OSA  (obstructive sleep apnea) 04/29/2018  . ESRD on dialysis ([place]) 04/29/2018  . Dyspnea on exertion 03/12/2017  . Morbid (severe) obesity due to excess calories ([place]) 03/12/2017  . Anemia of chronic kidney failure, stage 3 (moderate) ([place]) 12/10/2016  . History of colon cancer 08/12/2016  . Mass of left breast on mammogram 12/10/2015  . HTN (hypertension) 10/18/2015  . Depression 10/18/2015  . Numbness and tingling of left arm and leg 01/24/2015  . PVD (peripheral vascular disease) with claudication ([place]) 03/23/2014  . PAD (peripheral artery disease) ([place]) 12/14/2013  . Chronic diastolic heart failure ([place]) 11/15/2013  . Essential hypertension 11/15/2013  . Leucocytosis 04/28/2012    Past Surgical History:  Procedure Laterality Date  . ABDOMINAL AORTAGRAM N/A 07/26/2013   Procedure: ABDOMINAL Maxcine Ham;  Surgeon: [name] Clayton, MD;  Location: [facility] CATH LAB;  Service: Cardiovascular;  Laterality: N/A;  . ABDOMINAL HYSTERECTOMY    . AV FISTULA PLACEMENT Left 05/05/2018   Procedure: ARTERIOVENOUS (AV) FISTULA CREATION BRACHIOCEPHALIC;  Surgeon: Waynetta Sandy, MD;  Location: [facility];  Service: Vascular;  Laterality: Left;  . AV FISTULA PLACEMENT Left 07/16/2018   Procedure: ARTERIOVENOUS FISTULA CREATION;  Surgeon: Waynetta Sandy, MD;  Location: [facility];  Service: Vascular;  Laterality: Left;  .  BASCILIC VEIN TRANSPOSITION Left 09/03/2018   Procedure: BASILIC VEIN TRANSPOSITION SECOND STAGE;  Surgeon: Waynetta Sandy, MD;  Location: [facility];  Service: Vascular;  Laterality: Left;  . BREAST BIOPSY Left   . CESAREAN SECTION     X 3 [id number]  . CHOLECYSTECTOMY    . CHOLECYSTECTOMY  1980/s  . COLECTOMY  2010  . DIVERTICULOSIS SURGERY-2002  2012  . FEMORAL-POPLITEAL BYPASS GRAFT Left 08/13/2013   Procedure: BYPASS GRAFT FEMORAL-POPLITEAL ARTERY WITH NON-REVERSED SAPHANEOUS VEIN; ULTRASOUND GUIDED;  Surgeon: Mal [name], MD;  Location: [facility];  Service: Vascular;   Laterality: Left;  . INSERTION OF DIALYSIS CATHETER Right 04/30/2018   Procedure: INSERTION OF Right Internal Jugular DIALYSIS CATHETER;  Surgeon: [name], MD;  Location: [facility];  Service: Vascular;  Laterality: Right;  . IR FLUORO GUIDE CV LINE RIGHT  07/24/2020  . IR REMOVAL TUN CV CATH W/O FL  07/21/2020  . IR US GUIDE VASC ACCESS RIGHT  07/24/2020  . LOWER EXTREMITY ANGIOGRAM Left 07/26/2013   Procedure: LOWER EXTREMITY ANGIOGRAM;  Surgeon: [name] Paradise, MD;  Location: [facility] CATH LAB;  Service: Cardiovascular;  Laterality: Left;     OB History   No obstetric history on file.     Family History  Problem Relation Age of Onset  . Cancer Mother 97       breast and bone  . Cancer Father 68       prostate  . Hypertension Sister   . Bleeding Disorder Sister   . Cancer Cousin 20       breast cancer   . Hypertension Daughter   . Breast cancer Neg Hx     Social History   Tobacco Use  . Smoking status: Current Some Day Smoker    Packs/day: 0.50    Years: 50.00    Pack years: 25.00    Types: Cigarettes  . Smokeless tobacco: Former Systems developer  . Tobacco comment: has an occasional cigarette,   Vaping Use  . Vaping Use: Never used  Substance Use Topics  . Alcohol use: No    Alcohol/week: 0.0 standard drinks    Comment: she used to drink alcohol, quit in 2010   . Drug use: Yes    Types: Oxycodone    Home Medications Prior to Admission medications   Medication Sig Start Date End Date Taking? Authorizing Provider  oxyCODONE-acetaminophen (PERCOCET) 5-325 MG tablet Take 2 tablets by mouth every 8 (eight) hours as needed for severe pain. 02/06/21  Yes Melisia Leming, Corene Cornea, MD  acetaminophen (TYLENOL) 500 MG tablet Take 1,000 mg by mouth 2 (two) times daily as needed for moderate pain or headache.    [provider]  albuterol (PROVENTIL) (2.5 MG/3ML) 0.083% nebulizer solution Take 3 mLs (2.5 mg total) by nebulization every 4 (four) hours as needed for wheezing or shortness of breath.  11/07/20 11/07/21  [name], FNP  albuterol (VENTOLIN HFA) 108 (90 Base) MCG/ACT inhaler Inhale 2 puffs into the lungs every 6 (six) hours as needed for wheezing or shortness of breath. 08/03/20   [name], FNP  amLODipine (NORVASC) 5 MG tablet Take 1 tablet (5 mg total) by mouth daily. 09/28/20 09/28/21  [name], FNP  apixaban (ELIQUIS) 2.5 MG TABS tablet Take 1 tablet (2.5 mg total) by mouth 2 (two) times daily. 05/18/20   Truitt Merle, MD  Ascorbic Acid (VITAMIN C) 100 MG tablet Take 100 mg by mouth daily.    [provider]  atorvastatin (LIPITOR) 20  MG tablet Take 1 tablet (20 mg total) by mouth daily. 01/16/21   Cantwell, Celeste C, PA-C  AURYXIA 1 GM 210 MG(Fe) tablet Take 2-4 mg by mouth See admin instructions. 4gm with meal 2gm with snack 10/24/20   [provider]  Biotin 10000 MCG TABS Take 10,000 mcg by mouth daily.     [provider]  carvedilol (COREG) 12.5 MG tablet Take 12.5 mg by mouth 2 (two) times daily with a meal.     [provider]  doxercalciferol (HECTOROL) 4 MCG/2ML injection Doxercalciferol (Hectorol) 11/01/20 10/31/21  [provider]  meclizine (ANTIVERT) 12.5 MG tablet Take 1 tablet (12.5 mg total) by mouth 3 (three) times daily as needed for dizziness. 08/03/20   [name], FNP  multivitamin (RENA-VIT) TABS tablet Take 1 tablet by mouth at bedtime.    Edrick Oh, MD  mupirocin ointment (BACTROBAN) 2 % Apply 1 application topically daily as needed (skin bumps). 04/15/19   [name], FNP  nystatin (NYSTATIN) powder Apply 1 application topically 4 (four) times daily as needed. Patient taking differently: Apply 1 application topically 4 (four) times daily as needed (irriation).  08/03/20   [name], FNP  ondansetron (ZOFRAN ODT) 4 MG disintegrating tablet Take 1 tablet (4 mg total) by mouth every 6 (six) hours as needed. Patient taking differently: Take 4 mg by mouth every 6 (six) hours as needed for nausea or  vomiting.  06/08/20   [name], FNP  sevelamer carbonate (RENVELA) 800 MG tablet Take 1,600-3,200 mg by mouth 3 (three) times daily with meals. 1600mg  with snacks 3200mg  with meals 10/20/19   [provider]  vitamin E (VITAMIN E) 200 UNIT capsule Take 200 Units by mouth daily.    [provider]    Allergies    Carnosine, Gadolinium derivatives, Iohexol, Iodine, and Naltrexone  Review of Systems   Review of Systems  Respiratory: Positive for cough (nonproductive).   Cardiovascular: Positive for chest pain.  All other systems reviewed and are negative.   Physical Exam Updated Vital Signs BP 114/63 (BP Location: Right Arm)   Pulse 95   Temp 98.9 F (37.2 C) (Oral)   Resp 16   SpO2 96%   Physical Exam Vitals and nursing note reviewed.  Constitutional:      Appearance: She is well-developed and well-nourished.  HENT:     Head: Normocephalic and atraumatic.  Cardiovascular:     Rate and Rhythm: Normal rate and regular rhythm.  Pulmonary:     Effort: No respiratory distress.     Breath sounds: No stridor.  Chest:     Chest wall: Tenderness (left chest wall that exactly reproduces ysmptoms) present.  Abdominal:     General: There is no distension.  Musculoskeletal:     Cervical back: Normal range of motion.     Right lower leg: No tenderness. No edema.     Left lower leg: No tenderness. No edema.  Skin:    General: Skin is warm and dry.  Neurological:     Mental Status: She is alert.     ED Results / Procedures / Treatments   Labs (all labs ordered are listed, but only abnormal results are displayed) Labs Reviewed  BASIC METABOLIC PANEL - Abnormal; Notable for the following components:      Result Value   Chloride 93 (*)    Creatinine, Ser 4.81 (*)    Calcium 8.7 (*)    GFR, Estimated 9 (*)    All  other components within normal limits  CBC - Abnormal; Notable for the following components:   WBC 13.7 (*)    Hemoglobin 11.7 (*)    All  other components within normal limits  TROPONIN I (HIGH SENSITIVITY)  TROPONIN I (HIGH SENSITIVITY)    EKG EKG Interpretation  Date/Time:  Monday February 05 2021 15:18:30 EST Ventricular Rate:  91 PR Interval:  172 QRS Duration: 70 QT Interval:  374 QTC Calculation: 460 R Axis:   -45 Text Interpretation: Sinus rhythm with occasional Premature ventricular complexes Left axis deviation Abnormal ECG Confirmed by Quintella Reichert [phone]) on 02/05/2021 9:15:00 PM   Radiology DG Chest 2 View  Result Date: 02/05/2021 CLINICAL DATA:  65 year old female with chest pain. EXAM: CHEST - 2 VIEW COMPARISON:  Chest radiograph dated 10/30/2020. FINDINGS: Dialysis catheter in similar position. No focal consolidation, pleural effusion, pneumothorax. Mild cardiomegaly. No acute osseous pathology. IMPRESSION: No active cardiopulmonary disease. Electronically Signed   By: Anner Crete M.D.   On: 02/05/2021 15:59   CT Chest Wo Contrast  Result Date: 02/06/2021 CLINICAL DATA:  65 year old female with emphysema. Chest pain for 3 days. On Eliquis for history of blood clots. EXAM: CT CHEST WITHOUT CONTRAST TECHNIQUE: Multidetector CT imaging of the chest was performed following the standard protocol without IV contrast. COMPARISON:  Low-dose screening chest CT 12/12/2020. Chest radiographs 02/05/2021. FINDINGS: Cardiovascular: Extensive calcified coronary artery atherosclerosis (series 3, image 75). No cardiomegaly or pericardial effusion. Calcified aortic atherosclerosis. Vascular patency is not evaluated in the absence of IV contrast. Right chest dual lumen dialysis type catheter in place. Mediastinum/Nodes: Thyroid goiter with retrosternal extension to the pre-vascular space and inseparable from the upper thoracic esophagus appears stable. Small mediastinal lymph nodes are stable, within normal limits. Lungs/Pleura: Mildly lower lung volumes. Dependent opacity in the left lung most resembles atelectasis, with some  superimposed stable subpleural scarring. Trace retained secretions in the lower trachea on the right. Major airways are patent. Minimal atelectasis in the right costophrenic angle. Mild scarring in the right middle lobe. Tiny right upper lobe lung nodules have not significantly changed since December (series 7, image 40). Similar subtle nodularity in the left upper lobe appears stable. A left lower lobe lung nodule measuring 4-5 mm is questionably larger since December on series 7, image 82. No other acute pulmonary finding. Upper Abdomen: Absent gallbladder. Negative visible noncontrast liver, spleen, adrenal glands and stomach. Musculoskeletal: Widespread chronic vacuum disc. No acute osseous abnormality identified. IMPRESSION: 1. A 4-5 mm left lower lobe lung nodule is questionably larger since a screening Chest CT in December. Other small bilateral pulmonary nodules appear stable. Mild atelectasis but no other new pulmonary abnormality. Recommend a repeat Chest CT in June (perfer WITHOUT CONTRAST LOW-DOSE LUNG CANCER SCREENING protocol). 2. Severe calcified coronary artery atherosclerosis. Aortic Atherosclerosis (ICD10-I70.0). 3. Thyroid goiter with some retrosternal extension on the left is suspected and stable. Electronically Signed   By: Genevie Ann M.D.   On: 02/06/2021 05:58    Procedures Procedures   Medications Ordered in ED Medications  ibuprofen (ADVIL) tablet 400 mg (400 mg Oral Given 02/06/21 0239)  oxyCODONE-acetaminophen (PERCOCET/ROXICET) 5-325 MG per tablet 2 tablet (2 tablets Oral Given 02/06/21 2585)    ED Course  I have reviewed the triage vital signs and the nursing notes.  Pertinent labs & imaging results that were available during my care of the patient were reviewed by me and considered in my medical decision making (see chart for details).    MDM Rules/Calculators/A&P  Suspect that patient has musculoskeletal cause for chest pain.  Considered pulmonary  embolus but without hypoxia, tachypnea and is compliant on her medications and no asymmetric lower extremity swelling out to go to go too far down that route.  Low suspicion for ACS with an EKG unchanged from baseline and normal troponins and atypical sounding chest pain.  X-ray okay but did show questionable left lower lobe opacity so we get a CT scan which showed a nodule there that she already knew about but no other associated symptoms.  Pain seemed to improve with Percocet.  Will discharge to follow-up with PCP for further management and workup of the same.  Final Clinical Impression(s) / ED Diagnoses Final diagnoses:  Nonspecific chest pain    Rx / DC Orders ED Discharge Orders         Ordered    oxyCODONE-acetaminophen (PERCOCET) 5-325 MG tablet  Every 8 hours PRN        02/06/21 0625           Tej Murdaugh, Corene Cornea, MD 02/06/21 [phone]

## 2021-02-26 ENCOUNTER — Other Ambulatory Visit: Payer: Medicaid Other

## 2021-02-27 ENCOUNTER — Ambulatory Visit
Admission: RE | Admit: 2021-02-27 | Discharge: 2021-02-27 | Disposition: A | Discharge status: Home / Self Care | Payer: No Typology Code available for payment source | Source: Ambulatory Visit | Attending: Student | Admitting: Student

## 2021-02-27 DIAGNOSIS — I6523 Occlusion and stenosis of bilateral carotid arteries: Secondary | ICD-10-CM

## 2021-02-27 DIAGNOSIS — I5032 Chronic diastolic (congestive) heart failure: Secondary | ICD-10-CM

## 2021-02-27 DIAGNOSIS — I700 Atherosclerosis of aorta: Secondary | ICD-10-CM

## 2021-02-27 DIAGNOSIS — R072 Precordial pain: Secondary | ICD-10-CM

## 2021-02-27 IMAGING — CT CT CARDIAC CORONARY ARTERY CALCIUM SCORE
3 series · 14 of 20 positions shown, 16 images · non-contrast
Comparison: [DATE]

CLINICAL DATA: Chest pain, shortness of breath

EXAM:
CT CARDIAC CORONARY ARTERY CALCIUM SCORE
TECHNIQUE: Non-contrast imaging through the heart was performed using
prospective ECG gating. Image post processing was performed on an
independent workstation, allowing for quantitative analysis of the
heart and coronary arteries. Note that this exam targets the heart
and the chest was not imaged in its entirety.

[Series 2: calcium scoring 2.00 qr36 bestdiast 71% hrt calciu · axial · 0.43mm/px · z∈[+1682,+1754]mm · 4 of 60 slices shown]
[im 12/60  vessel]
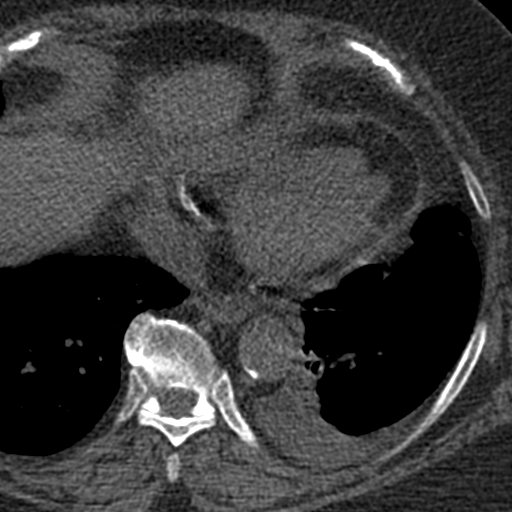
[im 24/60  vessel]
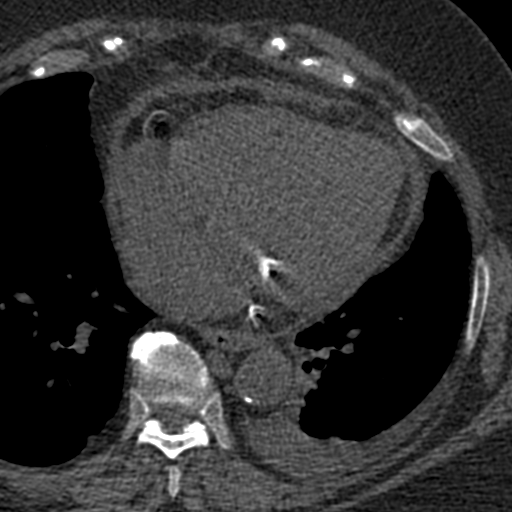
[im 36/60  vessel]
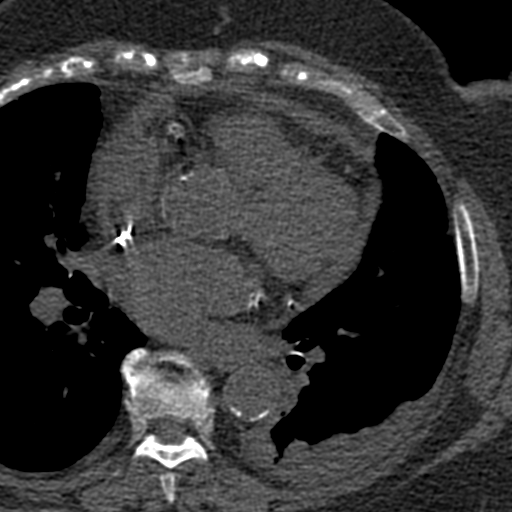
[im 48/60  vessel]
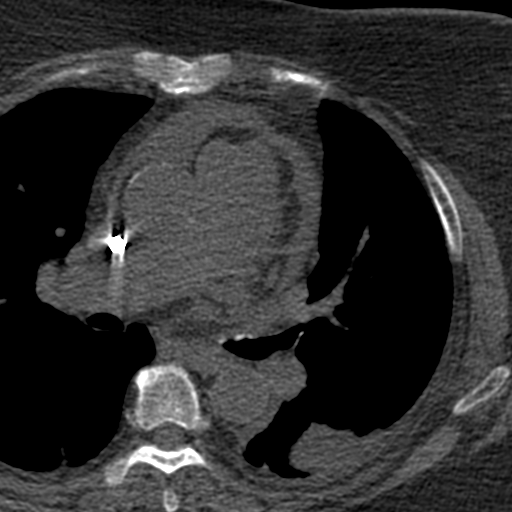

[Series 3: calcium scoring 2.00 br40 bestdiast 71% axial · axial · 0.63mm/px · z∈[+1678,+1758]mm · 5 of 60 slices shown, 7 images]
[im 10/60  vessel]
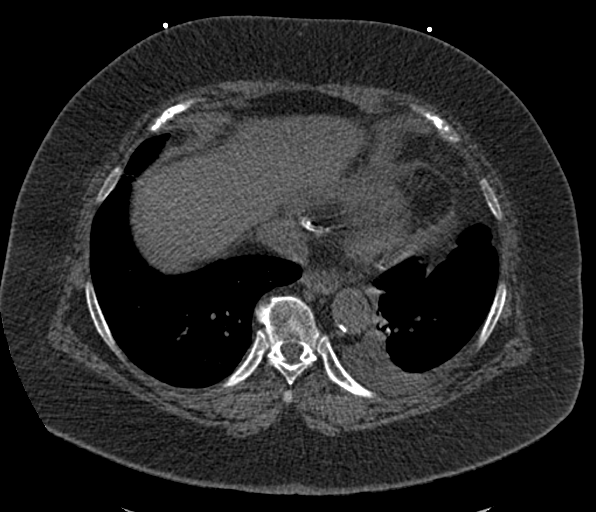
[im 10/60  lung]
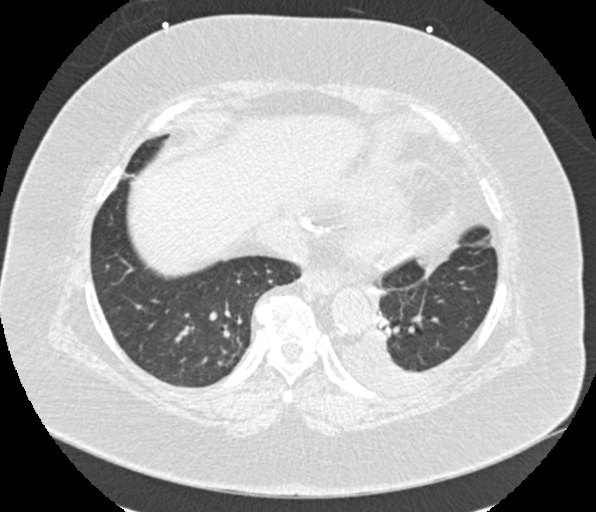
[im 20/60  vessel]
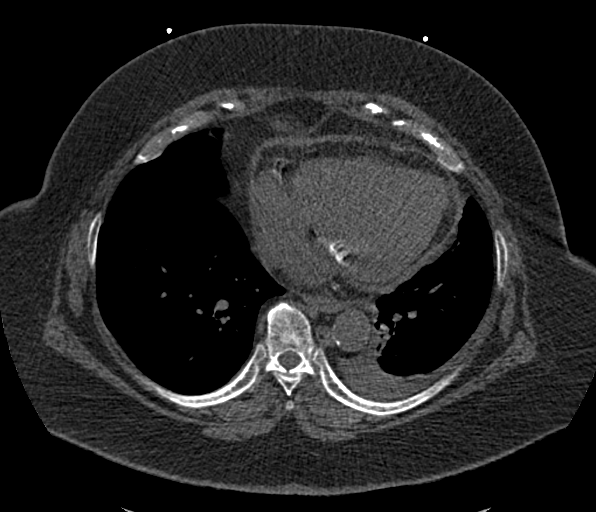
[im 30/60  vessel]
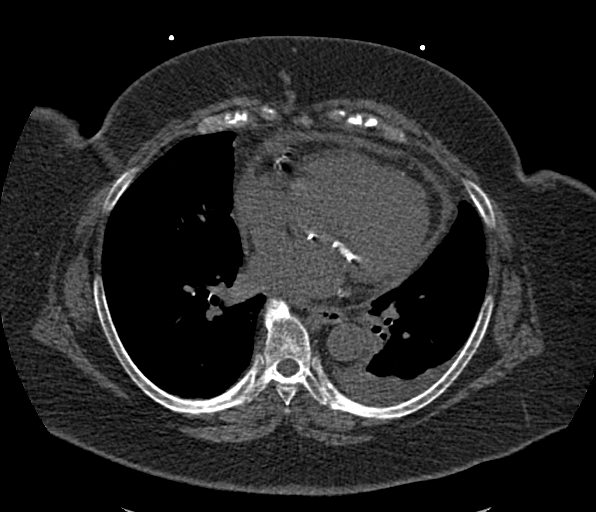
[im 40/60  vessel]
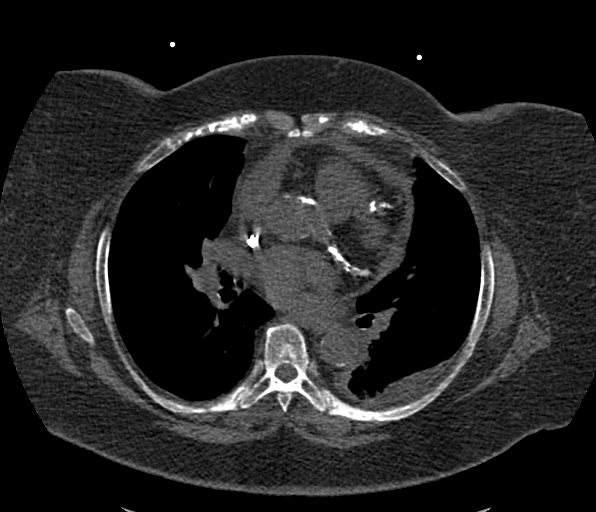
[im 50/60  vessel]
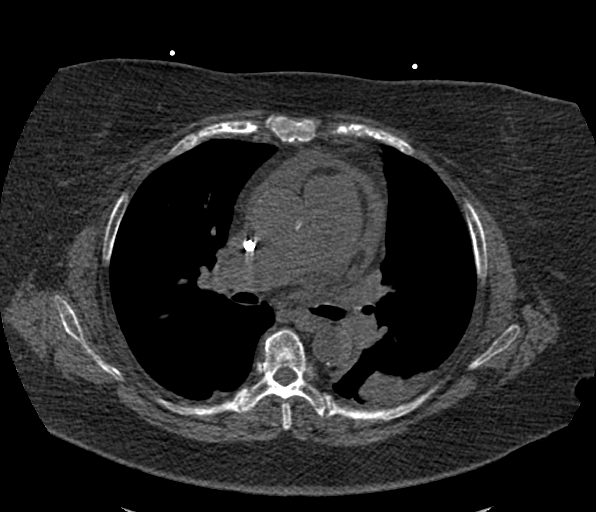
[im 50/60  lung]
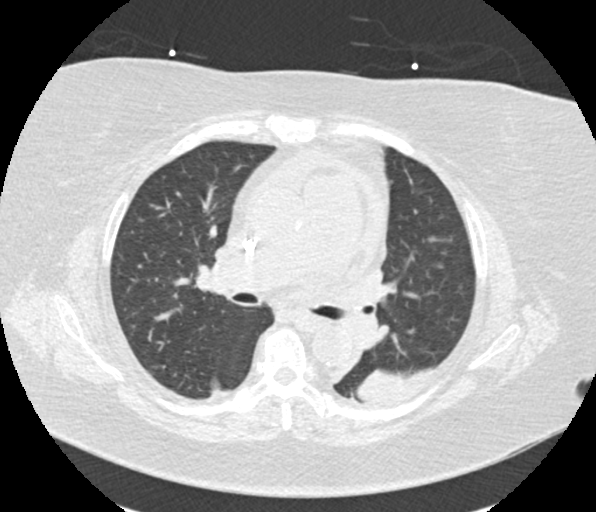

[Series 9: calcium scoring 2.00 br60 bestdiast 71% lungs · axial · 0.61mm/px · z∈[+1678,+1758]mm · 5 of 60 slices shown]
[im 10/60  vessel]
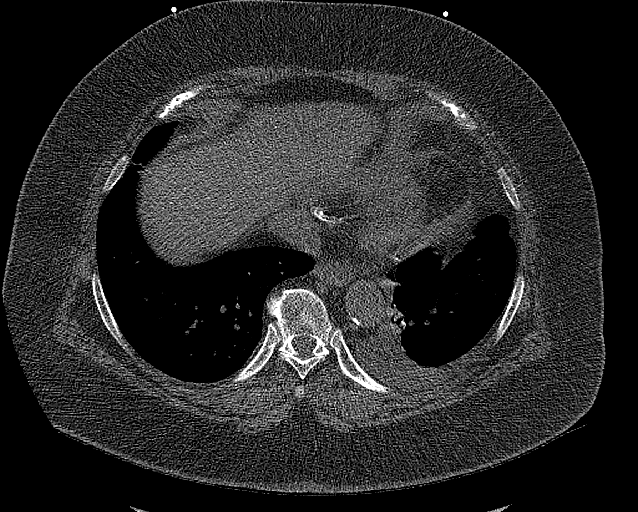
[im 20/60  vessel]
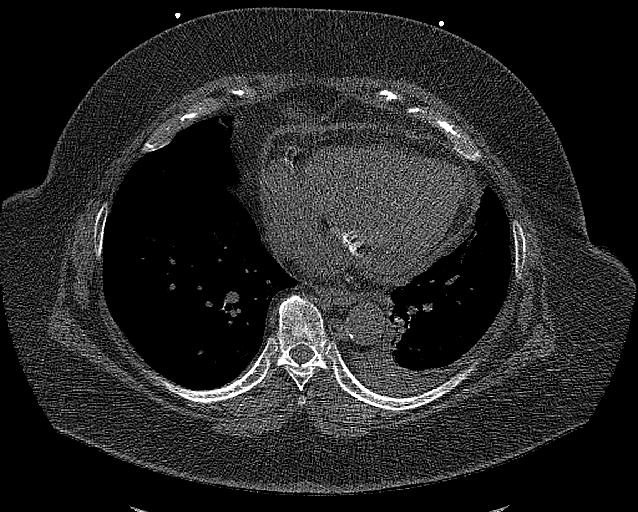
[im 30/60  vessel]
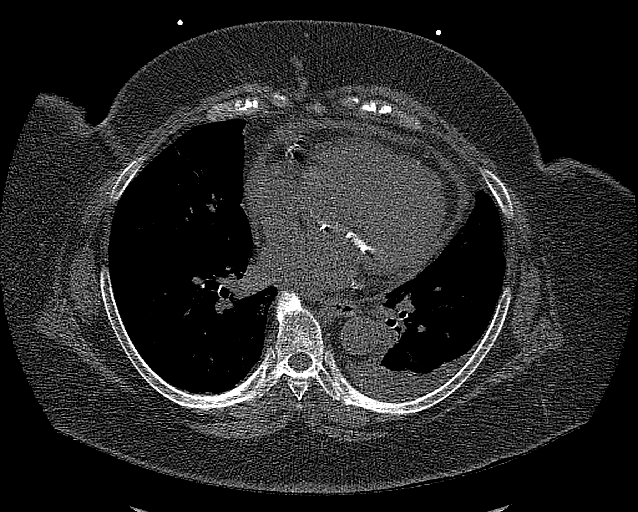
[im 40/60  vessel]
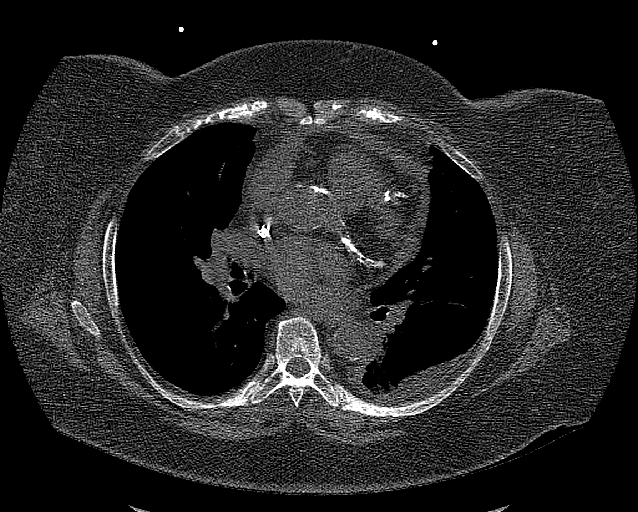
[im 50/60  vessel]
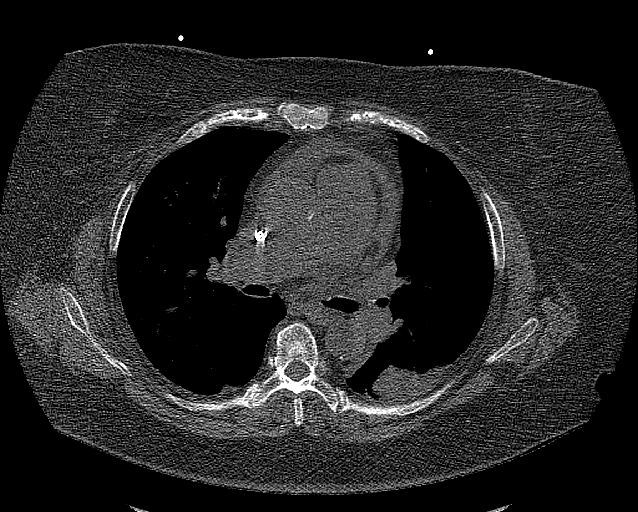

[14 of 20 positions shown; findings below may reference images not displayed]

FINDINGS: CORONARY CALCIUM SCORES:

Left Main: 432

LAD: 287

LCx: [XW]

RCA: [XW]

Total Agatston Score: [XW]

[HOSPITAL] percentile: 99

AORTA MEASUREMENTS:

Ascending Aorta: 31 mm

Descending Aorta: 24 mm

OTHER FINDINGS:

Extensive aortic calcifications. No aneurysm. No adenopathy. Small
left pleural effusion with left lower lobe atelectasis, new since
prior study. Linear scarring in the lung bases. Small pericardial
effusion, stable. Imaging into the upper abdomen demonstrates no
acute findings. Chest wall soft tissues are unremarkable. No acute
bony abnormality.
IMPRESSION: Total Agatston score: [XW]

[HOSPITAL] percentile: 99

Aortic atherosclerosis.

Small left pleural effusion.  Left base atelectasis.

Small pericardial effusion.

Bibasilar scarring.

## 2021-02-28 ENCOUNTER — Telehealth: Payer: Self-pay

## 2021-02-28 NOTE — Progress Notes (Addendum)
Primary Physician/Referring:  [name], FNP  Patient ID: Whitney Walker, female    DOB: 1956-07-01, 65 y.o.   MRN: [id number]  Chief Complaint  Patient presents with   Carotid atherosclerosis, bilateral   Follow-up   Chest Pain   HPI:    Whitney Walker  is a 65 y.o. African-American female with history of obesity, hypertension, hyperlipidemia, tobacco use, stage IV kidney disease on dialysis, obstructive sleep apnea not on CPAP, COPD and severe peripheral arterial disease with history of left femoral-popliteal bypass surgery by Dr. Victorino Dike in 2014.  Patient is on chronic anticoagulation with Eliquis due to recurrent DVT and PE in 2014 and 2016.  Patient is followed by hematology for hypercoagulable state.  She is currently undergoing hemodialysis on a Monday/Wednesday/Friday schedule.  Patient presents for follow-up with complaints of continued precordial and arm pain.  Also presents to discuss results of coronary calcium score.  He was evaluated in the emergency department at [facility] on 02/05/2021 with complaints of left shoulder pain and chest pain.  Troponin was negative and EKG unchanged, therefore patient was discharged home with Percocet for management of musculoskeletal chest pain.  Patient now reports pleuritic left-sided chest pain that radiates to her left shoulder.  She also reports some associated shortness of breath.  [name] pain is worse when sitting or lying flat.  Pain is not exacerbated with exertion.  Denies palpitations, syncope, near syncope, leg swelling, orthopnea, PND.  Notably she remains noncompliant with CPAP.  Past Medical History:  Diagnosis Date   Anemia    Anxiety    CHF (congestive heart failure) ([place])    Colon cancer ([place])    treatment surgery   Complication of anesthesia    after first C- [place] "couldnt walk after", patient denies having a spinal   COPD (chronic obstructive pulmonary disease) ([place])    Coronary artery  disease    Depression    DVT (deep venous thrombosis) (HCC)    ESRD (end stage renal disease) ([place])    Hemo: MWF   History of blood transfusion 04/2018   Hypertension    07/07/18- no longer takes BP medications   Meningitis    Pain in limb 07/30/2013   PE (pulmonary embolism)    Peripheral vascular disease (HCC)    Restless legs    Shortness of breath    with exertion   Sleep apnea    SOB (shortness of breath) 03/03/2019   Vertigo    Past Surgical History:  Procedure Laterality Date   ABDOMINAL AORTAGRAM N/A 07/26/2013   Procedure: ABDOMINAL Maxcine Ham;  Surgeon: [name] [place], MD;  Location: [facility] CATH LAB;  Service: Cardiovascular;  Laterality: N/A;   ABDOMINAL HYSTERECTOMY     AV FISTULA PLACEMENT Left 05/05/2018   Procedure: ARTERIOVENOUS (AV) FISTULA CREATION BRACHIOCEPHALIC;  Surgeon: Waynetta Sandy, MD;  Location: [place];  Service: Vascular;  Laterality: Left;   AV FISTULA PLACEMENT Left 07/16/2018   Procedure: ARTERIOVENOUS FISTULA CREATION;  Surgeon: Waynetta Sandy, MD;  Location: [place];  Service: Vascular;  Laterality: Left;   [place] Left 09/03/2018   Procedure: BASILIC VEIN TRANSPOSITION SECOND STAGE;  Surgeon: Waynetta Sandy, MD;  Location: [place];  Service: Vascular;  Laterality: Left;   BREAST BIOPSY Left    CESAREAN SECTION     X 3 1974-1977   CHOLECYSTECTOMY     CHOLECYSTECTOMY  1980/s   COLECTOMY  2010   DIVERTICULOSIS SURGERY-2002  2012   FEMORAL-POPLITEAL  BYPASS GRAFT Left 08/13/2013   Procedure: BYPASS GRAFT FEMORAL-POPLITEAL ARTERY WITH NON-REVERSED SAPHANEOUS VEIN; ULTRASOUND GUIDED;  Surgeon: Mal [name], MD;  Location: [place];  Service: Vascular;  Laterality: Left;   INSERTION OF DIALYSIS CATHETER Right 04/30/2018   Procedure: INSERTION OF Right Internal Jugular DIALYSIS CATHETER;  Surgeon: [name], MD;  Location: [facility] OR;  Service: Vascular;  Laterality: Right;   IR FLUORO GUIDE CV  LINE RIGHT  07/24/2020   IR REMOVAL TUN CV CATH W/O FL  07/21/2020   IR US GUIDE VASC ACCESS RIGHT  07/24/2020   LOWER EXTREMITY ANGIOGRAM Left 07/26/2013   Procedure: LOWER EXTREMITY ANGIOGRAM;  Surgeon: [name] [place], MD;  Location: [facility] CATH LAB;  Service: Cardiovascular;  Laterality: Left;   Family History  Problem Relation Age of Onset   Cancer Mother 44       breast and bone   Cancer Father 86       prostate   Hypertension Sister    Bleeding Disorder Sister    Cancer Cousin 90       breast cancer    Hypertension Daughter    Breast cancer Neg Hx     Social History   Tobacco Use   Smoking status: Current Some Day Smoker    Packs/day: 0.50    Years: 50.00    Pack years: 25.00    Types: Cigarettes   Smokeless tobacco: Former Systems developer   Tobacco comment: has an occasional cigarette,   Substance Use Topics   Alcohol use: No    Alcohol/week: 0.0 standard drinks    Comment: she used to drink alcohol, quit in 2010    Marital Status: Single   ROS  Review of Systems  Constitutional: Negative for malaise/fatigue and weight gain.  Cardiovascular: Positive for chest pain (pleuritic ). Negative for claudication, leg swelling, near-syncope, orthopnea, palpitations, paroxysmal nocturnal dyspnea and syncope.  Respiratory: Positive for shortness of breath.   Hematologic/Lymphatic: Does not bruise/bleed easily.  Musculoskeletal: Positive for joint pain (left shoulder).  Gastrointestinal: Negative for melena.  Neurological: Negative for dizziness and weakness.    Objective  Blood pressure 128/75, pulse (!) 107, temperature 98 F (36.7 C), resp. rate 17, height 5\' 1"  (1.549 m), weight 200 lb (90.7 kg), SpO2 99 %.  Vitals with BMI 03/01/2021 02/06/2021 02/06/2021  Height 5\' 1"  - -  Weight 200 lbs - -  BMI 20.25 - -  Systolic 427 062 376  Diastolic 75 63 78  Pulse 283 95 104    Physical Exam Vitals reviewed.  Constitutional:      Appearance: She is obese.  HENT:     Head:  Normocephalic and atraumatic.  Cardiovascular:     Rate and Rhythm: Regular rhythm. Tachycardia present.     Pulses: Intact distal pulses.     Heart sounds: S1 normal and S2 normal. Murmur heard.   Harsh midsystolic murmur is present at the upper right sternal border. No friction rub. No gallop.      Comments: Tunneled dialysis catheter in place  Pulmonary:     Effort: Pulmonary effort is normal. No respiratory distress.     Breath sounds: No wheezing, rhonchi or rales.  Musculoskeletal:     Right lower leg: No edema.     Left lower leg: No edema.  Skin:    General: Skin is warm and dry.  Neurological:     Mental Status: She is alert.     Laboratory examination:   Recent Labs  05/09/20 1246 05/10/20 0631 05/18/20 1036 10/18/20 0000 10/31/20 0027 11/01/20 0035 11/08/20 0000 02/05/21 1541  NA 136 135 133*   < > 136 135  --  135  K 4.8 5.1 5.1   < > 5.2* 6.3* 4.9 4.3  CL 96* 97* 96*   < > 93* 95*  --  93*  CO2 26 25 27    < > 28 25  --  29  GLUCOSE 71 100* 99   < > 100* 99  --  96  BUN 19 35* 38*   < > 39* 61*  --  16  CREATININE 5.80* 8.44* 6.98*   < > 8.04* 10.69*  --  4.81*  CALCIUM 9.1 9.1 9.9   < > 8.9 9.1  --  8.7*  GFRNONAA 7* 5* 6*   < > 5* 4*  --  9*  GFRAA 8* 5* 7*  --   --   --   --   --    < > = values in this interval not displayed.   CrCl cannot be calculated ([name] most recent lab result is older than the maximum 21 days allowed.).  CMP Latest Ref Rng & Units 02/05/2021 11/08/2020 11/01/2020  Glucose 70 - 99 mg/dL 96 - 99  BUN 8 - 23 mg/dL 16 - 61(H)  Creatinine 0.44 - 1.00 mg/dL 4.81(H) - 10.69(H)  Sodium 135 - 145 mmol/L 135 - 135  Potassium 3.5 - 5.1 mmol/L 4.3 4.9 6.3(HH)  Chloride 98 - 111 mmol/L 93(L) - 95(L)  CO2 22 - 32 mmol/L 29 - 25  Calcium 8.9 - 10.3 mg/dL 8.7(L) - 9.1  Total Protein 6.5 - 8.1 g/dL - - -  Total Bilirubin 0.3 - 1.2 mg/dL - - -  Alkaline Phos 38 - 126 U/L - - -  AST 15 - 41 U/L - - -  ALT 0 - 44 U/L - - -   CBC  Latest Ref Rng & Units 02/05/2021 11/15/2020 11/01/2020  WBC 4.0 - 10.5 K/uL 13.7(H) - 11.2(H)  Hemoglobin 12.0 - 15.0 g/dL 11.7(L) 32.4(A) 9.8(L)  Hematocrit 36.0 - 46.0 % 37.6 - 31.0(L)  Platelets 150 - 400 K/uL 240 - 161    Lipid Panel No results for input(s): CHOL, TRIG, LDLCALC, VLDL, HDL, CHOLHDL, LDLDIRECT in the last 8760 hours.  HEMOGLOBIN A1C Lab Results  Component Value Date   HGBA1C 5.1 06/04/2018   MPG 140 09/07/2009   TSH No results for input(s): TSH in the last 8760 hours.  External labs:   06/04/2018: HDL 63, LDL 97, total cholesterol 191, triglycerides 155  Medications and allergies   Allergies  Allergen Reactions   Carnosine     Other reaction(s): Unknown   Gadolinium Derivatives Hives and Other (See Comments)    HIVES, Desc: HIVES W/ "DYE" USED FOR 1ST CT SCAN BUT NOT 2ND, NO PREMEDS USED, PT UNCERTAIN OF CIRCUMSTANCES,,?POSSIBLE MRI CONTRAST ALLERGY, ALL STUDIES DONE "SOMEWHERE" IN [place]//A.C., Onset Date: 53299242   Iohexol Other (See Comments)     Code: HIVES, Desc: HIVES W/ "DYE" USED FOR 1ST CT SCAN BUT NOT 2ND, NO PREMEDS USED, PT UNCERTAIN OF CIRCUMSTANCES,,?POSSIBLE MRI CONTRAST ALLERGY, ALL STUDIES DONE "SOMEWHERE" IN [place]//A.C., Onset Date: [id number]    Iodine Hives   Naltrexone     Other reaction(s): Unknown     Outpatient Medications Prior to Visit  Medication Sig Dispense Refill   acetaminophen (TYLENOL) 500 MG tablet Take 1,000 mg by mouth 2 (two) times daily as needed for  moderate pain or headache.     albuterol (PROVENTIL) (2.5 MG/3ML) 0.083% nebulizer solution Take 3 mLs (2.5 mg total) by nebulization every 4 (four) hours as needed for wheezing or shortness of breath. 75 mL 2   albuterol (VENTOLIN HFA) 108 (90 Base) MCG/ACT inhaler Inhale 2 puffs into the lungs every 6 (six) hours as needed for wheezing or shortness of breath. 18 g 0   amLODipine (NORVASC) 5 MG tablet Take 1 tablet (5 mg total) by mouth daily. 90  tablet 1   apixaban (ELIQUIS) 2.5 MG TABS tablet Take 1 tablet (2.5 mg total) by mouth 2 (two) times daily. 180 tablet 3   Ascorbic Acid (VITAMIN C) 100 MG tablet Take 100 mg by mouth daily.     AURYXIA 1 GM 210 MG(Fe) tablet Take 2-4 mg by mouth See admin instructions. 4gm with meal 2gm with snack     Biotin 10000 MCG TABS Take 10,000 mcg by mouth daily.      carvedilol (COREG) 12.5 MG tablet Take 12.5 mg by mouth 2 (two) times daily with a meal.      doxercalciferol (HECTOROL) 4 MCG/2ML injection Doxercalciferol (Hectorol)     meclizine (ANTIVERT) 12.5 MG tablet Take 1 tablet (12.5 mg total) by mouth 3 (three) times daily as needed for dizziness. 30 tablet 0   multivitamin (RENA-VIT) TABS tablet Take 1 tablet by mouth at bedtime.     mupirocin ointment (BACTROBAN) 2 % Apply 1 application topically daily as needed (skin bumps). 22 g 2   nystatin (NYSTATIN) powder Apply 1 application topically 4 (four) times daily as needed. (Patient taking differently: Apply 1 application topically 4 (four) times daily as needed (irriation).) 30 g 1   ondansetron (ZOFRAN ODT) 4 MG disintegrating tablet Take 1 tablet (4 mg total) by mouth every 6 (six) hours as needed. (Patient taking differently: Take 4 mg by mouth every 6 (six) hours as needed for nausea or vomiting.) 20 tablet 0   oxyCODONE-acetaminophen (PERCOCET) 5-325 MG tablet Take 2 tablets by mouth every 8 (eight) hours as needed for severe pain. 15 tablet 0   vitamin E 200 UNIT capsule Take 200 Units by mouth daily.     sevelamer carbonate (RENVELA) 800 MG tablet Take 1,600-3,200 mg by mouth 3 (three) times daily with meals. 1600mg  with snacks 3200mg  with meals     atorvastatin (LIPITOR) 20 MG tablet Take 1 tablet (20 mg total) by mouth daily. 30 tablet 3   No facility-administered medications prior to visit.     Radiology:   No results found.  Cardiac Studies:   PCV MYOCARDIAL PERFUSION WO LEXISCAN 11/20/2020 Lexiscan not  administered at patient's request. Incomplete study. EKG tracing not available for review. Rest images show mild decrease in tracer update in basal inferior myocardium. Consider alternate ischemia studies, if clinically indicated.  Carotid artery duplex 11/16/2020:  Stenosis in the right internal carotid artery (1-15%).  Stenosis in the left internal carotid artery (50-69%).  Antegrade right vertebral artery flow. Antegrade left vertebral artery flow.  Follow up in six months is appropriate if clinically indicated.  PCV ECHOCARDIOGRAM COMPLETE 11/16/2020 Visually, hyperdynamic LV systolic function with visual EF >70%. Left ventricle cavity is normal in size. Severe left ventricular hypertrophy.  Normal global wall motion. Unable to evaluate diastolic function due to mitral annular calcification. Elevated LAP.  Consider workup for hypertrophic cardiomyopathy if clinically indicated. Native valve, mitral annular calcification.  Systolic anterior motion of mitral valve.  Mild mitral stenosis.  Mild (Grade  I) mitral regurgitation. Mild tricuspid regurgitation. No evidence of pulmonary hypertension. Moderate pulmonic regurgitation. Compared to prior study 03/26/2017: no significant change.  Nuclear stress test Lexiscan Myoview 08/05/2013: Resting EKG NSR, poor R wave progression.  Stress EKG was nondiagnostic for ischemia.  No ST-T changes of ischemia noted with pharmacologic stress testing.  Stress symptoms included shortness of breath and nausea.  Stress terminated due to completion of protocol. The perfusion study demonstrated normal isotope uptake both at rest and stress.  There was no evidence of ischemia or scar.  Dynamic gated images revealed normal wall motion and endocardial thickening.  Left ventricular ejection fraction estimated to be 75%.  No significant change from 01/03/2012.    CT neck 10/31/2011: Normal soft tissues.  Mild pulmonary artery dilation suggest hypertension.  Sleep study  2007: Sleep apnea, noncompliant with CPAP uses supplemental oxygen only.  EKG:   03/01/2021: Sinus rhythm at a rate of 96 bpm with first-degree AV block.  Biatrial enlargement.  Left axis, left anterior fascicular block.  T wave abnormality, cannot exclude anterolateral ischemia.  PR depression, suggestive of pericarditis.  11/14/2020: Sinus rhythm at a rate of 82 bpm with first-degree AV block, biatrial enlargement.  Left axis deviation, left anterior fascicular block.  Poor R wave progression, cannot exclude anteroseptal infarct old.  Cannot exclude inferior ischemia.  Incomplete right bundle branch block.  Compared to EKG 10/30/2020 (external), no significant change.  12/03/2018: Sinus tachycardia at 113 beats per minute with first-degree AV block.  Normal axis.  PR WP cannot exclude anterior infarct old.  No evidence of ischemia.  Except for tachycardia no changes noted to EKG.  Assessment     ICD-10-CM   1. Precordial pain  R07.2 EKG 12-Lead    CBC    Novel Coronavirus, NAA ([facility])    PCV ECHOCARDIOGRAM COMPLETE  2. Chronic diastolic heart failure (HCC)  I50.32 EKG 12-Lead  3. Essential hypertension  J09 Basic metabolic panel  4. Elevated coronary artery calcium score  R93.1   5. Carotid atherosclerosis, bilateral  I65.23 atorvastatin (LIPITOR) 10 MG tablet  6. Aortic atherosclerosis (HCC)  I70.0 atorvastatin (LIPITOR) 10 MG tablet  7. Acute pericarditis, unspecified type  I30.9 PCV ECHOCARDIOGRAM COMPLETE     Medications Discontinued During This Encounter  Medication Reason   atorvastatin (LIPITOR) 20 MG tablet Error    Meds ordered this encounter  Medications   atorvastatin (LIPITOR) 10 MG tablet    Sig: Take 1 tablet (10 mg total) by mouth daily.    Dispense:  90 tablet    Refill:  3   ibuprofen (ADVIL) 800 MG tablet    Sig: Take 1 tablet (800 mg total) by mouth 3 (three) times daily.    Dispense:  90 tablet    Refill:  0   pantoprazole (PROTONIX) 40 MG tablet     Sig: Take 1 tablet (40 mg total) by mouth daily.    Dispense:  30 tablet    Refill:  0    Recommendations:   Whitney Walker is a 65 y.o. African-American female with history of obesity, hypertension, hyperlipidemia, tobacco use, stage IV kidney disease on dialysis, obstructive sleep apnea not on CPAP, COPD and severe peripheral arterial disease with history of left femoral-popliteal bypass surgery by Dr. Victorino Dike in 2014.  Patient is on chronic anticoagulation with Eliquis due to recurrent DVT and PE in 2014 and 2016.  Patient is followed by hematology for hypercoagulable state.  She is currently undergoing hemodialysis on a  Monday/Wednesday/Friday schedule.  Patient was evaluated in the emergency department 10/30/2020 with elevated troponin suspicious for type II MI due to underlying congestive heart failure.  Echocardiogram is unchanged compared to 2018, recommend ischemic evaluation.  Patient presents for follow-up of cardiac CT and with continued complaints of precordial and arm pain.  Reviewed and discussed with patient regarding results of cardiac CT, details above.  In view of patient's symptoms as well as EKG findings suggestive of an underlying pericarditis, suspect this may be causing her pleuritic chest pain.  Will start patient on ibuprofen 800 mg 3 times daily for the next 2 weeks, and as long as 4 weeks if pain persists.  Patient is on hemodialysis 3 days/week, with needles use of high-dose NSAIDs.  We will also start patient on Protonix 40 mg daily in order to prevent adverse impacts of ibuprofen none GI.  [name] recent cardiac CT did note small left pleural effusion as well as small pericardial effusion, suspect CT has over called pericardial effusion.  Patient advised that nephrology has lowered her dry weight in view of underlying pleural effusion.  Suspicion that either pleural effusion or pericardial effusion are contributing significantly to patient's symptoms is  low.  Suspicion for ACS is low as EKG is without acute ischemic changes or underlying injury pattern, as well patient symptoms are atypical at best, and rather suggest pericarditis as underlying etiology.  In regard to coronary calcium score of greater than 4000, patient has severe levels of coronary calcium.  In view of patient's calcium score in the 99th percentile as well as her underlying risk factors for coronary artery disease recommend patient undergo cardiac catheterization. The left heart catheterization procedure was explained to the patient in detail. The indication, alternatives, risks and benefits were reviewed. Complications including but not limited to bleeding, infection, acute kidney injury, blood transfusion, heart rhythm disturbances, contrast (dye) reaction, damage to the arteries or nerves in the legs or hands, cerebrovascular accident, myocardial infarction, need for emergent bypass surgery, blood clots in the legs, possible need for emergent blood transfusion, and rarely death were reviewed and discussed with the patient. The patient voices understanding and wishes to proceed.  In regard to hyperlipidemia, patient reports she was unable to tolerate increased dose of atorvastatin due to myalgias.  Will reduce atorvastatin from 20 mg to 10 mg daily.  Patient may benefit from addition of Zetia in the future, however she prefers to hold off at this time in view of ongoing chest pain.  Counseled patient regarding increased risk of bleeding with ibuprofen commendation of Eliquis, patient verbalized understanding and agrees to notify our office of any bleeding diathesis.  Also again discussed smoking cessation and weight loss as well as CPAP compliance nightly.  Follow-up in 4 weeks, sooner if needed, for pericarditis, severe coronary calcium score.  Patient was seen in collaboration with Dr. Terri Skains. He also reviewed patient's chart and Dr. Terri Skains is in agreement of the plan.    Alethia Berthold, PA-C 03/01/2021, 1:08 PM Office: [phone]

## 2021-02-28 NOTE — Telephone Encounter (Signed)
If pain is similar to what she was evaluated for in the ED recently, it is likely musculoskeletal related. However, see if she can come into office tomorrow or Tuesday next week.

## 2021-02-28 NOTE — Telephone Encounter (Signed)
Pt will be here tomorrow at 8:30

## 2021-03-01 ENCOUNTER — Ambulatory Visit: Payer: Medicaid Other | Admitting: Student

## 2021-03-01 ENCOUNTER — Other Ambulatory Visit: Payer: Self-pay

## 2021-03-01 ENCOUNTER — Encounter: Payer: Self-pay | Admitting: Student

## 2021-03-01 ENCOUNTER — Telehealth: Payer: Self-pay

## 2021-03-01 VITALS — BP 128/75 | HR 107 | Temp 98.0°F | Resp 17 | Ht 61.0 in | Wt 200.0 lb

## 2021-03-01 DIAGNOSIS — I700 Atherosclerosis of aorta: Secondary | ICD-10-CM

## 2021-03-01 DIAGNOSIS — I10 Essential (primary) hypertension: Secondary | ICD-10-CM

## 2021-03-01 DIAGNOSIS — I5032 Chronic diastolic (congestive) heart failure: Secondary | ICD-10-CM

## 2021-03-01 DIAGNOSIS — R072 Precordial pain: Secondary | ICD-10-CM

## 2021-03-01 DIAGNOSIS — I309 Acute pericarditis, unspecified: Secondary | ICD-10-CM

## 2021-03-01 DIAGNOSIS — R931 Abnormal findings on diagnostic imaging of heart and coronary circulation: Secondary | ICD-10-CM

## 2021-03-01 DIAGNOSIS — I6523 Occlusion and stenosis of bilateral carotid arteries: Secondary | ICD-10-CM

## 2021-03-01 MED ORDER — ATORVASTATIN CALCIUM 10 MG PO TABS: 10.0000 mg | ORAL_TABLET | Freq: Every day | ORAL | 3 refills | Status: DC

## 2021-03-01 MED ORDER — IBUPROFEN 800 MG PO TABS: 800.0000 mg | ORAL_TABLET | Freq: Three times a day (TID) | ORAL | 0 refills | Status: DC

## 2021-03-01 MED ORDER — PANTOPRAZOLE SODIUM 40 MG PO TBEC: 40.0000 mg | DELAYED_RELEASE_TABLET | Freq: Every day | ORAL | 0 refills | Status: DC

## 2021-03-01 NOTE — Chronic Care Management (AMB) (Signed)
Chronic Care Management Pharmacy Assistant   Name: Whitney Walker  MRN: [id number] DOB: 1956/04/15   Reason for Encounter: Medication Review   Recent office visits:  None  Recent consult visits:  03/01/2021- Lawerance Cruel (Cardiology)  Hospital visits:  Medication Reconciliation was completed by comparing discharge summary, patients EMR and Pharmacy list, and upon discussion with patient.  Admitted to the hospital on 02/05/2021 due to Nonspecific chest pain. Discharge date was 02/06/2021. Discharged from [facility] Emergency Department.  New?Medications Started at [facility] Discharge:?? -started Oxycodone 5/325 mg- 2 tablets by mouth every 8 hours as needed for pain due to Nonspecific chest pain  Medication Changes at Hospital Discharge: -None  Medications Discontinued at Hospital Discharge: -None  Medications that remain the same after Hospital Discharge:??  -All other medications will remain the same.     Reviewed chart for medication changes ahead of medication coordination call.  Medication changes indicated: 03/01/2021- Ibuprofen 800 mg- 1 tablet 3 times daily and Pantoprazole 40 mg- 1 tablet daily added, Atorvastatin changed from 20 mg to 10 mg- 1 tablet daily.   BP Readings from Last 3 Encounters:  03/01/21 128/75  02/06/21 114/63  01/16/21 (!) 153/96    Lab Results  Component Value Date   HGBA1C 5.1 06/04/2018     Patient obtains medications through Vials  90 Days   Last adherence delivery included: None  Patient declined the following medications last month:   Patient is due for next adherence delivery on: 03/09/2021. 03/06/2021- Called patient and reviewed medications and coordinated delivery.  This delivery to include: None needed, Patient is current out of town in [place] with family. Patient will return to [place] around 3/17 or 03/12/2021. Patient has all of her medications with her and insists she is not out of any medications. 03/01/2021-  patient called to request new prescription sent in for Ibuprofen 800 mg and Pantoprazole 40mg , intake form sent to pharmacy for delivery on 03/01/2021.    No short fill or acute fill needed  Patient declined the following medications: Albuterol inhaler 108 mcg- inhale 2 puffs every 6 hours due to PRN use.  Carvedilol 12.5 mg- 1 tablet daily on Mon, Wed, and Fridays and 1 tablet twice daily on Tues, Thurs, Sat and Sundays due to receiving a 84 days supply on 12/11/2020.  Eliquis 2.5 mg-1 tablet by mouth twice a day                         Due to receiving a 90 day supply on 12/11/2020.  Amlodipine 5 mg- 1 tablet daily due to being discontinued by Nephrology. Lunesta 2 mg- 1 tablet nightly due to not helping with Insomniapatient discontinued Nystatin100 gm- apply four times a day as needed. Meclizine12.5 mg- 1 tablet three times a day for dizziness due to PRN use. Ondansetron 4 mg- Dissolve 1 tablet under tongue every 6 hours due to PRN use. Azithromycin 250 mg- due to completed course. Amoxicillin 500 mg- due to completed course. Atorvastatin 20 mg- due to being changed to 10 mg- patient will use supply on hand, will discuss next month on quantity left, last prescription received on 01/17/2021 for 53 day supply, patient also stopped taking medication at the end of January.   Patient needs refills for None  Patient aware that we will follow up with her next month to review medications and coordinate delivery if needed. Patient also mentioned that new medications given 03/01/2021- Ibuprofen 800  mg and Pantoprazole 40 mg may be weaned off by Cardiologist if patient does not need any more after completing 30 day prescription.   Medications: Outpatient Encounter Medications as of 03/01/2021  Medication Sig Note   acetaminophen (TYLENOL) 500 MG tablet Take 1,000 mg by mouth 2 (two) times daily as needed for moderate pain or headache.    albuterol (PROVENTIL) (2.5 MG/3ML) 0.083%  nebulizer solution Take 3 mLs (2.5 mg total) by nebulization every 4 (four) hours as needed for wheezing or shortness of breath.    albuterol (VENTOLIN HFA) 108 (90 Base) MCG/ACT inhaler Inhale 2 puffs into the lungs every 6 (six) hours as needed for wheezing or shortness of breath.    amLODipine (NORVASC) 5 MG tablet Take 1 tablet (5 mg total) by mouth daily.    apixaban (ELIQUIS) 2.5 MG TABS tablet Take 1 tablet (2.5 mg total) by mouth 2 (two) times daily.    Ascorbic Acid (VITAMIN C) 100 MG tablet Take 100 mg by mouth daily.    atorvastatin (LIPITOR) 10 MG tablet Take 1 tablet (10 mg total) by mouth daily.    AURYXIA 1 GM 210 MG(Fe) tablet Take 2-4 mg by mouth See admin instructions. 4gm with meal 2gm with snack 10/30/2020: Has not started, starting when she leaves hospital to get prescription   Biotin 10000 MCG TABS Take 10,000 mcg by mouth daily.     carvedilol (COREG) 12.5 MG tablet Take 12.5 mg by mouth 2 (two) times daily with a meal.     doxercalciferol (HECTOROL) 4 MCG/2ML injection Doxercalciferol (Hectorol)    ibuprofen (ADVIL) 800 MG tablet Take 1 tablet (800 mg total) by mouth 3 (three) times daily.    meclizine (ANTIVERT) 12.5 MG tablet Take 1 tablet (12.5 mg total) by mouth 3 (three) times daily as needed for dizziness.    multivitamin (RENA-VIT) TABS tablet Take 1 tablet by mouth at bedtime.    mupirocin ointment (BACTROBAN) 2 % Apply 1 application topically daily as needed (skin bumps).    nystatin (NYSTATIN) powder Apply 1 application topically 4 (four) times daily as needed. (Patient taking differently: Apply 1 application topically 4 (four) times daily as needed (irriation).)    ondansetron (ZOFRAN ODT) 4 MG disintegrating tablet Take 1 tablet (4 mg total) by mouth every 6 (six) hours as needed. (Patient taking differently: Take 4 mg by mouth every 6 (six) hours as needed for nausea or vomiting.)    oxyCODONE-acetaminophen (PERCOCET) 5-325 MG tablet Take 2 tablets  by mouth every 8 (eight) hours as needed for severe pain.    pantoprazole (PROTONIX) 40 MG tablet Take 1 tablet (40 mg total) by mouth daily.    sevelamer carbonate (RENVELA) 800 MG tablet Take 1,600-3,200 mg by mouth 3 (three) times daily with meals. 1600mg  with snacks 3200mg  with meals 10/30/2020: DC as of 10/30/2020 & start ayruxia   vitamin E 200 UNIT capsule Take 200 Units by mouth daily.    No facility-administered encounter medications on file as of 03/01/2021.      Star Rating Drugs: Atorvastatin 20 mg- last filled 01/17/2021 53 day supply.   Patient is currently out of town in [place] visiting son, she should be back in [place] 3/17 or 03/22/2021, patient reported that she almost had to go to the hospital in [place] due to [facility] in [place] not sending paperwork to the Dialysis Clinic in [place], but it was straightened out and patient was able to have her Diaylsis treatment yesterday 03/05/2021. Patient states her  blood pressure dropped during treatment 77/63 and had to stay in there for an hour or more to bring blood pressure back up to 115/63. Patient mentioned that she took her blood pressure that morning due to not know if she was going to have treatment or not but that could have been the reason blood pressure dropped. Patient states she felt really good after treatment which has not happened in a long time. Patient is also scheduled for a Heart Cath on 04/10/2021. Patient will call when she returns to [place] to discuss new medication and changed medications.  [name], CPP notified. No fill needed report uploaded for review to CPP and [facility].   SIG: [name], [place] Pharmacist Assistant [phone]

## 2021-03-02 NOTE — Progress Notes (Incomplete)
Reviewed chart for medication changes ahead of medication coordination call.  No OVs, Consults, or hospital visits since last care coordination call/Pharmacist visit. (If appropriate, list visit date, provider name)  No medication changes indicated OR if recent visit, treatment plan here.  BP Readings from Last 3 Encounters:  03/01/21 128/75  02/06/21 114/63  01/16/21 (!) 153/96    Lab Results  Component Value Date   HGBA1C 5.1 06/04/2018     Patient obtains medications through {Packaging choices:23805}  {Day Supply:23806}   Last adherence delivery included: (medication name and frequency)  Patient declined (meds) last month due to PRN use/additional supply on hand. Explanation of abundance on hand (ie #30 due to overlapping fills or previous adherence issues etc)  Patient is due for next adherence delivery on: ***. Called patient and reviewed medications and coordinated delivery.  This delivery to include: Patient will need a short fill of (med), prior to adherence delivery. (To align with sync date or if PRN med)  Coordinated acute fill for (med) to be delivered (date).  Patient declined the following medications (meds) due to (reason)  Patient needs refills for ***.  Confirmed delivery date of ***, advised patient that pharmacy will contact them the morning of delivery.

## 2021-03-06 ENCOUNTER — Other Ambulatory Visit: Payer: Medicaid Other

## 2021-03-07 ENCOUNTER — Ambulatory Visit: Payer: Medicare Other | Admitting: Nurse Practitioner

## 2021-03-09 ENCOUNTER — Telehealth: Payer: 59

## 2021-03-15 ENCOUNTER — Ambulatory Visit (INDEPENDENT_AMBULATORY_CARE_PROVIDER_SITE_OTHER): Payer: 59 | Admitting: Nurse Practitioner

## 2021-03-15 ENCOUNTER — Other Ambulatory Visit: Payer: Self-pay

## 2021-03-15 ENCOUNTER — Encounter: Payer: Self-pay | Admitting: Nurse Practitioner

## 2021-03-15 VITALS — BP 120/82 | HR 94 | Temp 98.0°F | Ht 61.0 in | Wt 197.2 lb

## 2021-03-15 DIAGNOSIS — N186 End stage renal disease: Secondary | ICD-10-CM

## 2021-03-15 DIAGNOSIS — I739 Peripheral vascular disease, unspecified: Secondary | ICD-10-CM

## 2021-03-15 DIAGNOSIS — E559 Vitamin D deficiency, unspecified: Secondary | ICD-10-CM

## 2021-03-15 DIAGNOSIS — Z992 Dependence on renal dialysis: Secondary | ICD-10-CM

## 2021-03-15 DIAGNOSIS — I129 Hypertensive chronic kidney disease with stage 1 through stage 4 chronic kidney disease, or unspecified chronic kidney disease: Secondary | ICD-10-CM | POA: Diagnosis not present

## 2021-03-15 DIAGNOSIS — I5032 Chronic diastolic (congestive) heart failure: Secondary | ICD-10-CM

## 2021-03-15 DIAGNOSIS — I10 Essential (primary) hypertension: Secondary | ICD-10-CM

## 2021-03-15 NOTE — Patient Instructions (Signed)

## 2021-03-15 NOTE — Progress Notes (Signed)
I,Yamilka Roman [employer] as a Education administrator for [employer], FNP.,have documented all relevant documentation on the behalf of [name], FNP,as directed by  [name], FNP while in the presence of [name], [place]. This visit occurred during the SARS-CoV-2 public health emergency.  Safety protocols were in place, including screening questions prior to the visit, additional usage of staff PPE, and extensive cleaning of exam room while observing appropriate contact time as indicated for disinfecting solutions.  Subjective:     Patient ID: Whitney Walker , female    DOB: 1956-05-29 , 65 y.o.   MRN: [id number]   Chief Complaint  Patient presents with  . Hypertension  . vitamin d     HPI  Patient presents today for a blood pressure and vitamin d recheck. She is on the list for a kidney transplant - she has just found out.  She does not take her blood pressure medication in the morning and the night before her dialysis on MWF. She has been given oxygen at dialysis due to her oxygen level dropping to 86%  Wt Readings from Last 3 Encounters: 03/15/21 : 197 lb 3.2 oz (89.4 kg) 03/01/21 : 200 lb (90.7 kg) 11/14/20 : 200 lb (90.7 kg)  Hypertension This is a chronic problem. The current episode started more than 1 year ago. The problem is unchanged. The problem is controlled. Pertinent negatives include no anxiety. There are no associated agents to hypertension. Risk factors for coronary artery disease include obesity and sedentary lifestyle.     Past Medical History:  Diagnosis Date  . Anemia   . Anxiety   . CHF (congestive heart failure) ([place])   . Colon cancer [facility])    treatment surgery  . Complication of anesthesia    after first C- [place] "couldnt walk after", patient denies having a spinal  . COPD (chronic obstructive pulmonary disease) ([place])   . Coronary artery disease   . Depression   . DVT (deep venous thrombosis) ([place])   . ESRD (end stage renal disease) ([place])     Hemo: MWF  . History of blood transfusion 04/2018  . Hypertension    07/07/18- no longer takes BP medications  . Meningitis   . Pain in limb 07/30/2013  . PE (pulmonary embolism)   . Peripheral vascular disease ([place])   . Restless legs   . Shortness of breath    with exertion  . Sleep apnea   . SOB (shortness of breath) 03/03/2019  . Vertigo      Family History  Problem Relation Age of Onset  . Cancer Mother 3       breast and bone  . Cancer Father 60       prostate  . Hypertension Sister   . Bleeding Disorder Sister   . Cancer Cousin 20       breast cancer   . Hypertension Daughter   . Breast cancer Neg Hx      Current Outpatient Medications:  .  acetaminophen (TYLENOL) 500 MG tablet, Take 1,000 mg by mouth 2 (two) times daily as needed for moderate pain or headache., Disp: , Rfl:  .  albuterol (PROVENTIL) (2.5 MG/3ML) 0.083% nebulizer solution, Take 3 mLs (2.5 mg total) by nebulization every 4 (four) hours as needed for wheezing or shortness of breath., Disp: 75 mL, Rfl: 2 .  albuterol (VENTOLIN HFA) 108 (90 Base) MCG/ACT inhaler, Inhale 2 puffs into the lungs every 6 (six) hours as needed for wheezing or shortness of  breath., Disp: 18 g, Rfl: 0 .  amLODipine (NORVASC) 5 MG tablet, Take 1 tablet (5 mg total) by mouth daily., Disp: 90 tablet, Rfl: 1 .  apixaban (ELIQUIS) 2.5 MG TABS tablet, Take 1 tablet (2.5 mg total) by mouth 2 (two) times daily., Disp: 180 tablet, Rfl: 3 .  Ascorbic Acid (VITAMIN C) 100 MG tablet, Take 100 mg by mouth daily., Disp: , Rfl:  .  atorvastatin (LIPITOR) 10 MG tablet, Take 1 tablet (10 mg total) by mouth daily., Disp: 90 tablet, Rfl: 3 .  AURYXIA 1 GM 210 MG(Fe) tablet, Take 2-4 mg by mouth See admin instructions. 4gm with meal 2gm with snack, Disp: , Rfl:  .  Biotin 10000 MCG TABS, Take 10,000 mcg by mouth daily. , Disp: , Rfl:  .  carvedilol (COREG) 12.5 MG tablet, Take 12.5 mg by mouth 2 (two) times daily with a meal. , Disp: , Rfl:  .   doxercalciferol (HECTOROL) 4 MCG/2ML injection, Doxercalciferol (Hectorol), Disp: , Rfl:  .  ibuprofen (ADVIL) 800 MG tablet, Take 1 tablet (800 mg total) by mouth 3 (three) times daily., Disp: 90 tablet, Rfl: 0 .  meclizine (ANTIVERT) 12.5 MG tablet, Take 1 tablet (12.5 mg total) by mouth 3 (three) times daily as needed for dizziness., Disp: 30 tablet, Rfl: 0 .  multivitamin (RENA-VIT) TABS tablet, Take 1 tablet by mouth at bedtime., Disp: , Rfl:  .  mupirocin ointment (BACTROBAN) 2 %, Apply 1 application topically daily as needed (skin bumps)., Disp: 22 g, Rfl: 2 .  nystatin (NYSTATIN) powder, Apply 1 application topically 4 (four) times daily as needed. (Patient taking differently: Apply 1 application topically 4 (four) times daily as needed (irriation).), Disp: 30 g, Rfl: 1 .  ondansetron (ZOFRAN ODT) 4 MG disintegrating tablet, Take 1 tablet (4 mg total) by mouth every 6 (six) hours as needed. (Patient taking differently: Take 4 mg by mouth every 6 (six) hours as needed for nausea or vomiting.), Disp: 20 tablet, Rfl: 0 .  oxyCODONE-acetaminophen (PERCOCET) 5-325 MG tablet, Take 2 tablets by mouth every 8 (eight) hours as needed for severe pain., Disp: 15 tablet, Rfl: 0 .  pantoprazole (PROTONIX) 40 MG tablet, Take 1 tablet (40 mg total) by mouth daily., Disp: 30 tablet, Rfl: 0 .  sevelamer carbonate (RENVELA) 800 MG tablet, Take 1,600-3,200 mg by mouth 3 (three) times daily with meals. 1653m with snacks 3205mwith meals, Disp: , Rfl:  .  vitamin E 200 UNIT capsule, Take 200 Units by mouth daily., Disp: , Rfl:  .  amoxicillin (AMOXIL) 500 MG tablet, Take 1 tablet (500 mg total) by mouth 2 (two) times daily., Disp: 14 tablet, Rfl: 0   Allergies  Allergen Reactions  . Carnosine     Other reaction(s): Unknown  . Gadolinium Derivatives Hives and Other (See Comments)    HIVES, Desc: HIVES W/ "DYE" USED FOR 1ST CT SCAN BUT NOT 2ND, NO PREMEDS USED, PT UNCERTAIN OF CIRCUMSTANCES,,?POSSIBLE MRI  CONTRAST ALLERGY, ALL STUDIES DONE "SOMEWHERE" IN [place]//A.C., Onset Date: [id number]. Iohexol Other (See Comments)     Code: HIVES, Desc: HIVES W/ "DYE" USED FOR 1ST CT SCAN BUT NOT 2ND, NO PREMEDS USED, PT UNCERTAIN OF CIRCUMSTANCES,,?POSSIBLE MRI CONTRAST ALLERGY, ALL STUDIES DONE "SOMEWHERE" IN [place]//A.C., Onset Date: [id number] . Iodine Hives  . Naltrexone     Other reaction(s): Unknown     Review of Systems  Constitutional: Negative.   HENT: Negative.   Eyes: Negative.   Respiratory:  Negative.   Cardiovascular: Negative.   Gastrointestinal: Negative.   Endocrine: Negative for polydipsia, polyphagia and polyuria.  Genitourinary: Negative.   Musculoskeletal: Negative.   Skin: Negative.   Neurological: Negative.   Hematological: Negative.   Psychiatric/Behavioral: Negative.      Today's Vitals   03/15/21 1044  BP: 120/82  Pulse: 94  Temp: 98 F (36.7 C)  TempSrc: Oral  SpO2: 96%  Weight: 197 lb 3.2 oz (89.4 kg)  Height: _0  (1.549 m)  PainSc: 0-No pain   Body mass index is 37.26 kg/m.   Objective:  Physical Exam Constitutional:      General: She is not in acute distress.    Appearance: Normal appearance. She is obese.  Cardiovascular:     Rate and Rhythm: Normal rate and regular rhythm.     Pulses: Normal pulses.     Heart sounds: Normal heart sounds.  Pulmonary:     Effort: Pulmonary effort is normal.     Breath sounds: Normal breath sounds.  Abdominal:     General: Abdomen is flat. Bowel sounds are normal.     Palpations: Abdomen is soft.  Musculoskeletal:        General: Normal range of motion.     Cervical back: Normal range of motion and neck supple.  Skin:    General: Skin is warm and dry.     Capillary Refill: Capillary refill takes less than 2 seconds.  Neurological:     General: No focal deficit present.     Mental Status: She is alert and oriented to person, place, and time.  Psychiatric:        Mood and Affect: Mood  normal.        Behavior: Behavior normal.        Thought Content: Thought content normal.        Judgment: Judgment normal.         Assessment And Plan:     1. Vitamin D deficiency  Will check vitamin D level and supplement as needed.     Also encouraged to spend 15 minutes in the sun daily.  - Vitamin D (25 hydroxy)  2. Essential hypertension  Chronic, blood pressure is well controlled  Continue with current medications  - CBC - CMP14+EGFR  3. ESRD (end stage renal disease) on dialysis ([place])  Continue dialysis on MWF  4. Morbid (severe) obesity due to excess calories (HCC)  Chronic  Discussed healthy diet and regular exercise options   Encouraged to exercise at least 150 minutes per week with 2 days of strength training as tolerated  5. PVD (peripheral vascular disease) with claudication (HCC)  Chronic, she is on Eliquis - CBC - Lipid panel  6. Chronic diastolic heart failure (HCC)  Stable, continue follow up with Cardiology  No swelling noted at this time     Patient was given opportunity to ask questions. Patient verbalized understanding of the plan and was able to repeat key elements of the plan. All questions were answered to their satisfaction.  [name], FNP   I, [name], FNP, have reviewed all documentation for this visit. The documentation on 03/15/21 for the exam, diagnosis, procedures, and orders are all accurate and complete.   IF YOU HAVE BEEN REFERRED TO A SPECIALIST, IT MAY TAKE 1-2 WEEKS TO SCHEDULE/PROCESS THE REFERRAL. IF YOU HAVE NOT HEARD FROM US/SPECIALIST IN TWO WEEKS, PLEASE GIVE [place] A CALL AT [phone].   THE PATIENT IS ENCOURAGED TO PRACTICE SOCIAL  DISTANCING DUE TO THE COVID-19 PANDEMIC.

## 2021-03-16 ENCOUNTER — Telehealth: Payer: Self-pay

## 2021-03-16 LAB — LIPID PANEL
Chol/HDL Ratio: 3.3 ratio (ref 0.0–4.4)
Cholesterol, Total: 183 mg/dL (ref 100–199)
HDL: 55 mg/dL (ref >39)
LDL Chol Calc (NIH): 100 mg/dL — ABNORMAL HIGH (ref 0–99)
Triglycerides: 159 mg/dL — ABNORMAL HIGH (ref 0–149)
VLDL Cholesterol Cal: 28 mg/dL (ref 5–40)

## 2021-03-16 LAB — CMP14+EGFR
ALT: 19 IU/L (ref 0–32)
AST: 19 IU/L (ref 0–40)
Albumin/Globulin Ratio: 1 — ABNORMAL LOW (ref 1.2–2.2)
Albumin: 3.7 g/dL — ABNORMAL LOW (ref 3.8–4.8)
Alkaline Phosphatase: 110 IU/L (ref 44–121)
BUN/Creatinine Ratio: 4 — ABNORMAL LOW (ref 12–28)
BUN: 27 mg/dL (ref 8–27)
Bilirubin Total: 0.4 mg/dL (ref 0.0–1.2)
CO2: 26 mmol/L (ref 20–29)
Calcium: 10.1 mg/dL (ref 8.7–10.3)
Chloride: 94 mmol/L — ABNORMAL LOW (ref 96–106)
Creatinine, Ser: 6.27 mg/dL — ABNORMAL HIGH (ref 0.57–1.00)
Globulin, Total: 3.7 g/dL (ref 1.5–4.5)
Glucose: 72 mg/dL (ref 65–99)
Potassium: 5.2 mmol/L (ref 3.5–5.2)
Sodium: 139 mmol/L (ref 134–144)
Total Protein: 7.4 g/dL (ref 6.0–8.5)
eGFR: 7 mL/min/{1.73_m2} — ABNORMAL LOW (ref >59)

## 2021-03-16 LAB — CBC
Hematocrit: 34 % (ref 34.0–46.6)
Hemoglobin: 11 g/dL — ABNORMAL LOW (ref 11.1–15.9)
MCH: 28.4 pg (ref 26.6–33.0)
MCHC: 32.4 g/dL (ref 31.5–35.7)
MCV: 88 fL (ref 79–97)
Platelets: 212 10*3/uL (ref 150–450)
RBC: 3.87 x10E6/uL (ref 3.77–5.28)
RDW: 14.8 % (ref 11.7–15.4)
WBC: 14.1 10*3/uL — ABNORMAL HIGH (ref 3.4–10.8)

## 2021-03-16 LAB — VITAMIN D 25 HYDROXY (VIT D DEFICIENCY, FRACTURES): Vit D, 25-Hydroxy: 9.1 ng/mL — ABNORMAL LOW (ref 30.0–100.0)

## 2021-03-16 NOTE — Progress Notes (Signed)
Please advised patient to follow-up regarding elevated white blood count with PCP.  Please inform patient with high-dose NSAIDs her renal function has deteriorated, however as she is on dialysis will continue treatment with ibuprofen.  Cholesterol is elevated, please advise patient to avoid fatty foods and carbohydrates.  We will discuss further at next visit.  Will also send labs to nephrology for review and input.

## 2021-03-16 NOTE — Chronic Care Management (AMB) (Signed)
Chronic Care Management Pharmacy Assistant   Name: Whitney Walker  MRN: [id number] DOB: [birth year]  Reason for Encounter: Medication Review   03/16/2021- Called patient, returning call from 03/15/2021, No answer, left message to return call.  Patient returned call, she wants to inform me that she was back in town, she is still ok with her medications. Patient had a follow up visit with PCP- [name], FNP 03/15/2021- No medication changes.  03/20/2021- Patient called with questions about Cardiac test she was to have as part of the testing she needs for Kidney Transplant, patient is very nervous about some test, she is not sure if she wants to have them done, she has rescheduled echocardiogram ultrasound back a few weeks. Assured patient that it was ok to be nervous and she could research and call Cardiologist and Nephrologist with any questions about these procedures to help her feel more comfortable with having.  Patient also received a call about lab results and was told her white blood count is elevated and her Vitamin D level was low. Patient stated her white blood count has always been elevated but they monitor her at dialysis. Patient also mentioned that they give her Vitamin D in her IV during dialysis so she does not know how her Vitamin D could be running low. I asked patient If she was having any urinary symptoms, coughing or any sinus issues per [name], FNP on lab results. Patient stated she started getting chills and checked her temperature, it was at 101.3. Patient aware I will notify [name], CPP and reach out to PCP to see about antibiotics for patent. Patient also mentioned that Cardiology continued her Ibuprofen and Pantoprazole for 2 more weeks.  Message sent to PCP nurse regarding fever and antibiotic medication. Amoxicillin sent in for patient, patient aware.   03/29/2021- Patient called with questions about HMO coverage with [facility] Medicare, she will start with this  coverage on 03/30/2021 and wonders if she is making a good decision, informed patient that I have not heard of any complaints regarding this coverage but suggest she check with all of her providers office and her insurance agent to make sure they are all in-network and preferred office for better payment options and coverage. Patient will call [facility] Cardiology and the Dialysis Center to see if they accept [facility] Medicare and call me back. Patient returned my call, she was unable to get in touch with Cardiology or Dialysis Center but will call her insurance agent at [facility] and will call me back. Patient returned call with clarificaiton, she was able to get a response from [facility] insurance agent, Cardiology and [place], they are all in network. Patient also mentioned wanting to get and GYN outside of [facility] system for privacy, she does not want her GYN notes shared with [facility] providers or in [place], patient will research GYN but reminded patient to make sure that practice will be in-network and preferred with her insurance. Patient aware and very thankful for our help.   Medications: Outpatient Encounter Medications as of 03/16/2021  Medication Sig Note   acetaminophen (TYLENOL) 500 MG tablet Take 1,000 mg by mouth 2 (two) times daily as needed for moderate pain or headache.    albuterol (PROVENTIL) (2.5 MG/3ML) 0.083% nebulizer solution Take 3 mLs (2.5 mg total) by nebulization every 4 (four) hours as needed for wheezing or shortness of breath.    albuterol (VENTOLIN HFA) 108 (90 Base) MCG/ACT inhaler Inhale 2 puffs into  the lungs every 6 (six) hours as needed for wheezing or shortness of breath.    amLODipine (NORVASC) 5 MG tablet Take 1 tablet (5 mg total) by mouth daily.    apixaban (ELIQUIS) 2.5 MG TABS tablet Take 1 tablet (2.5 mg total) by mouth 2 (two) times daily.    Ascorbic Acid (VITAMIN C) 100 MG tablet Take 100 mg by mouth daily.    atorvastatin (LIPITOR) 10 MG tablet Take 1 tablet (10  mg total) by mouth daily.    AURYXIA 1 GM 210 MG(Fe) tablet Take 2-4 mg by mouth See admin instructions. 4gm with meal 2gm with snack 10/30/2020: Has not started, starting when she leaves hospital to get prescription   Biotin 10000 MCG TABS Take 10,000 mcg by mouth daily.     carvedilol (COREG) 12.5 MG tablet Take 12.5 mg by mouth 2 (two) times daily with a meal.     doxercalciferol (HECTOROL) 4 MCG/2ML injection Doxercalciferol (Hectorol)    ibuprofen (ADVIL) 800 MG tablet Take 1 tablet (800 mg total) by mouth 3 (three) times daily.    meclizine (ANTIVERT) 12.5 MG tablet Take 1 tablet (12.5 mg total) by mouth 3 (three) times daily as needed for dizziness.    multivitamin (RENA-VIT) TABS tablet Take 1 tablet by mouth at bedtime.    mupirocin ointment (BACTROBAN) 2 % Apply 1 application topically daily as needed (skin bumps).    nystatin (NYSTATIN) powder Apply 1 application topically 4 (four) times daily as needed. (Patient taking differently: Apply 1 application topically 4 (four) times daily as needed (irriation).)    ondansetron (ZOFRAN ODT) 4 MG disintegrating tablet Take 1 tablet (4 mg total) by mouth every 6 (six) hours as needed. (Patient taking differently: Take 4 mg by mouth every 6 (six) hours as needed for nausea or vomiting.)    oxyCODONE-acetaminophen (PERCOCET) 5-325 MG tablet Take 2 tablets by mouth every 8 (eight) hours as needed for severe pain.    pantoprazole (PROTONIX) 40 MG tablet Take 1 tablet (40 mg total) by mouth daily.    sevelamer carbonate (RENVELA) 800 MG tablet Take 1,600-3,200 mg by mouth 3 (three) times daily with meals. 1600mg  with snacks 3200mg  with meals 10/30/2020: DC as of 10/30/2020 & start ayruxia   vitamin E 200 UNIT capsule Take 200 Units by mouth daily.    No facility-administered encounter medications on file as of 03/16/2021.    Star Rating Drugs: Atorvastatin 20 mg- Last filled 01/17/2021 for 53 day supply at [facility].  SIG: [name],  [place] Pharmacist Assistant [phone]

## 2021-03-19 ENCOUNTER — Telehealth: Payer: Self-pay

## 2021-03-19 NOTE — Telephone Encounter (Signed)
Patient called and stated that she is not 100% ready for the heart cath proposed to her. She has questions and concerns and would like for you to give her a call. Please and thank you.

## 2021-03-19 NOTE — Telephone Encounter (Signed)
Called patient and again discussed the indications of proceeding with cardiac cath. She expressed concern that she is anxious about "making sure I am sedated enough". I reassured her and address her questions. Patient prefers to proceed with cardiac cath as previously scheduled.

## 2021-03-20 ENCOUNTER — Other Ambulatory Visit: Payer: Self-pay | Admitting: Nurse Practitioner

## 2021-03-20 MED ORDER — AMOXICILLIN 500 MG PO TABS: 500.0000 mg | ORAL_TABLET | Freq: Two times a day (BID) | ORAL | 0 refills | Status: DC

## 2021-03-30 DIAGNOSIS — N2581 Secondary hyperparathyroidism of renal origin: Secondary | ICD-10-CM | POA: Diagnosis not present

## 2021-03-30 DIAGNOSIS — D689 Coagulation defect, unspecified: Secondary | ICD-10-CM | POA: Diagnosis not present

## 2021-03-30 DIAGNOSIS — N186 End stage renal disease: Secondary | ICD-10-CM | POA: Diagnosis not present

## 2021-03-30 DIAGNOSIS — Z992 Dependence on renal dialysis: Secondary | ICD-10-CM | POA: Diagnosis not present

## 2021-03-30 DIAGNOSIS — D509 Iron deficiency anemia, unspecified: Secondary | ICD-10-CM | POA: Diagnosis not present

## 2021-03-30 DIAGNOSIS — D631 Anemia in chronic kidney disease: Secondary | ICD-10-CM | POA: Diagnosis not present

## 2021-04-02 DIAGNOSIS — E875 Hyperkalemia: Secondary | ICD-10-CM | POA: Diagnosis not present

## 2021-04-02 DIAGNOSIS — Z992 Dependence on renal dialysis: Secondary | ICD-10-CM | POA: Diagnosis not present

## 2021-04-02 DIAGNOSIS — D689 Coagulation defect, unspecified: Secondary | ICD-10-CM | POA: Diagnosis not present

## 2021-04-02 DIAGNOSIS — N2581 Secondary hyperparathyroidism of renal origin: Secondary | ICD-10-CM | POA: Diagnosis not present

## 2021-04-02 DIAGNOSIS — N186 End stage renal disease: Secondary | ICD-10-CM | POA: Diagnosis not present

## 2021-04-02 DIAGNOSIS — D509 Iron deficiency anemia, unspecified: Secondary | ICD-10-CM | POA: Diagnosis not present

## 2021-04-03 ENCOUNTER — Ambulatory Visit: Payer: Medicare Other

## 2021-04-03 ENCOUNTER — Other Ambulatory Visit: Payer: Self-pay

## 2021-04-03 ENCOUNTER — Telehealth: Payer: Self-pay

## 2021-04-03 DIAGNOSIS — I309 Acute pericarditis, unspecified: Secondary | ICD-10-CM

## 2021-04-03 DIAGNOSIS — R072 Precordial pain: Secondary | ICD-10-CM | POA: Diagnosis not present

## 2021-04-04 ENCOUNTER — Telehealth: Payer: Self-pay

## 2021-04-04 DIAGNOSIS — D689 Coagulation defect, unspecified: Secondary | ICD-10-CM | POA: Diagnosis not present

## 2021-04-04 DIAGNOSIS — Z992 Dependence on renal dialysis: Secondary | ICD-10-CM | POA: Diagnosis not present

## 2021-04-04 DIAGNOSIS — N186 End stage renal disease: Secondary | ICD-10-CM | POA: Diagnosis not present

## 2021-04-04 DIAGNOSIS — E875 Hyperkalemia: Secondary | ICD-10-CM | POA: Diagnosis not present

## 2021-04-04 DIAGNOSIS — D509 Iron deficiency anemia, unspecified: Secondary | ICD-10-CM | POA: Diagnosis not present

## 2021-04-04 DIAGNOSIS — N2581 Secondary hyperparathyroidism of renal origin: Secondary | ICD-10-CM | POA: Diagnosis not present

## 2021-04-04 NOTE — Chronic Care Management (AMB) (Addendum)
v   Chronic Care Management Pharmacy Assistant   Name: Whitney Walker  MRN: [id number] DOB: Apr 20, 1956  Reason for Encounter: Medication Review/ Medication Coordination Call.  Recent office visits:  03/15/2021-Whitney Walker, [place] (PCP)  Recent consult visits:  None  Hospital visits:  Medication Reconciliation was completed by comparing discharge summary, patient's EMR and Pharmacy list, and upon discussion with patient.   Admitted to the hospital on 02/05/2021 due to Nonspecific chest pain. Discharge date was 02/06/2021. Discharged from [facility] Emergency Department.   New?Medications Started at [facility] Discharge:?? -started Oxycodone 5/325 mg- 2 tablets by mouth every 8 hours as needed for pain due to Nonspecific chest pain   Medication Changes at Hospital Discharge: -None   Medications Discontinued at Hospital Discharge: -None   Medications that remain the same after Hospital Discharge:??  -All other medications will remain the same.  Reviewed chart for medication changes ahead of medication coordination call.  No recent medication changes indicated.  BP Readings from Last 3 Encounters:  03/15/21 120/82  03/01/21 128/75  02/06/21 114/63    Lab Results  Component Value Date   HGBA1C 5.1 06/04/2018     Patient obtains medications through V[facility]  90 Days   Last adherence delivery included:  None needed, Patient is current out of town in [place] with family. Patient will return to [place] around 3/17 or 03/12/2021. Patient has all of her medications with her and insists she is not out of any medications. 03/01/2021- patient called to request new prescription sent in for Ibuprofen 800 mg and Pantoprazole 40mg , intake form sent to pharmacy for delivery on 03/01/2021.   Patient declined the following medications last month: Albuterol inhaler 108 mcg- inhale 2 puffs every 6 hours due to PRN use.    Carvedilol 12.5 mg- 1 tablet daily on Mon, Wed, and Fridays and 1  tablet twice daily on Tues, Thurs, Sat and Sundays due to receiving a 84 days supply on 12/11/2020.   Eliquis 2.5 mg - 1 tablet by mouth twice a day                         Due to receiving a 90 day supply on 12/11/2020.   Amlodipine 5 mg- 1 tablet daily due to being discontinued by Nephrology.  Lunesta 2 mg- 1 tablet nightly due to not helping with Insomnia patient discontinued Nystatin 100 gm- apply four times a day as needed. Meclizine 12.5 mg- 1 tablet three times a day for dizziness due to PRN use.  Ondansetron 4 mg-  Dissolve 1 tablet under tongue every 6 hours due to PRN use. Azithromycin 250 mg- due to completed course. Amoxicillin 500 mg- due to completed course. Atorvastatin 20 mg- due to being changed to 10 mg- patient will use supply on hand, will discuss next month on quantity left, last prescription received on 01/17/2021 for 53 day supply, patient also stopped taking medication at the end of January.    Patient is due for next adherence delivery on: 04/06/2021. Called patient and reviewed medications and coordinated delivery.  This delivery to include: None   No short fill needed prior to delivery.  Coordinated future acute fill for Carvedilol 12.5 mg  to be delivered 04/19/2021.  Patient declined the following medications: Albuterol inhaler 108 mcg- inhale 2 puffs every 6 hours due to PRN use.    Carvedilol 12.5 mg- 1 tablet daily on Mon, Wed, and Fridays and 1 tablet twice daily on  Tues, Thurs, Sat and Sundays due to receiving a 30 day supply on 03/22/2021.   Eliquis 2.5 mg - 1 tablet by mouth twice a day                         Due to receiving a 90 day supply on 03/22/2021.   Amlodipine 5 mg- 1 tablet daily due to being discontinued by Nephrology.  Lunesta 2 mg- 1 tablet nightly due to not helping with Insomnia patient discontinued Nystatin 100 gm- apply four times a day as needed. Meclizine 12.5 mg- 1 tablet three times a day for dizziness due to PRN use.   Ondansetron 4 mg-  Dissolve 1 tablet under tongue every 6 hours due to PRN use. Azithromycin 250 mg- due to completed course. Amoxicillin 500 mg- due to completed course. Atorvastatin 20 mg- due to being changed to 10 mg- patient will use supply on hand. Ibuprofen 800 mg- 1 tablet by mouth three times daily due to completing course per Whitney Walker (Cardiology) Pantoprazole 40mg - 1 tablet by mouth daily due to completing course per Whitney Walker (Cardiology)   Patient needs refills for: None  Patient aware we will follow up with her next month to review medications and coordinate delivery if needed. Patient also aware she will receive her Carvedilol on 04/19/2020 short supply to sync with medication delivery in 05/2021.    [facility] for refills Pantoprazole 40 mg and Ibuprofen 800 mg  and to see if Whitney Cruel, PA would like patient to continue medications. Telephone system problems, will retry.   [facility] again, was able to speak with nurse, she discussed with Whitney Cruel, PA and she did not recommend patient continue with Pantoprazole 40 mg or Ibuprofen 800 mg. Patient aware these recommendations.   Medications: Outpatient Encounter Medications as of 04/04/2021  Medication Sig   acetaminophen (TYLENOL) 500 MG tablet Take 1,000 mg by mouth 2 (two) times daily as needed for moderate pain or headache.   albuterol (PROVENTIL) (2.5 MG/3ML) 0.083% nebulizer solution Take 3 mLs (2.5 mg total) by nebulization every 4 (four) hours as needed for wheezing or shortness of breath. (Patient not taking: No sig reported)   albuterol (VENTOLIN HFA) 108 (90 Base) MCG/ACT inhaler Inhale 2 puffs into the lungs every 6 (six) hours as needed for wheezing or shortness of breath.   amLODipine (NORVASC) 5 MG tablet Take 1 tablet (5 mg total) by mouth daily.   amoxicillin (AMOXIL) 500 MG tablet Take 1 tablet (500 mg total) by mouth 2 (two) times daily.  (Patient not taking: No sig reported)   apixaban (ELIQUIS) 2.5 MG TABS tablet Take 1 tablet (2.5 mg total) by mouth 2 (two) times daily.   Ascorbic Acid (VITAMIN C) 100 MG tablet Take 100 mg by mouth daily.   atorvastatin (LIPITOR) 10 MG tablet Take 1 tablet (10 mg total) by mouth daily.   Biotin 10000 MCG TABS Take 10,000 mcg by mouth daily.    carvedilol (COREG) 12.5 MG tablet Take 12.5 mg by mouth 2 (two) times daily with a meal.    doxercalciferol (HECTOROL) 4 MCG/2ML injection Doxercalciferol (Hectorol)   ibuprofen (ADVIL) 800 MG tablet Take 1 tablet (800 mg total) by mouth 3 (three) times daily.   lanthanum (FOSRENOL) 1000 MG chewable tablet Chew 1,000 mg by mouth See admin instructions. Take 1000 mg with each meal and each snack   meclizine (ANTIVERT) 12.5 MG tablet Take 1 tablet (12.5 mg  total) by mouth 3 (three) times daily as needed for dizziness.   multivitamin (RENA-VIT) TABS tablet Take 1 tablet by mouth at bedtime.   mupirocin ointment (BACTROBAN) 2 % Apply 1 application topically daily as needed (skin bumps).   nystatin (NYSTATIN) powder Apply 1 application topically 4 (four) times daily as needed. (Patient taking differently: Apply 1 application topically 4 (four) times daily as needed (irriation).)   oxyCODONE-acetaminophen (PERCOCET) 5-325 MG tablet Take 2 tablets by mouth every 8 (eight) hours as needed for severe pain.   pantoprazole (PROTONIX) 40 MG tablet Take 1 tablet (40 mg total) by mouth daily.   vitamin E 200 UNIT capsule Take 200 Units by mouth daily.   No facility-administered encounter medications on file as of 04/04/2021.    Star Rating Drugs: Atorvastatin 20 mg- Last filled 01/17/2021 for 53 day supply at [facility].   Notes: Patient checks her blood pressures at home, reports good blood pressure readings at home at 133/77 and lower, will also get readings in the 120/70's. Patient mentioned her blood pressures usually drop during Dialysis treatments but no  longer high at home.   04/06/2021- Patient called back stating she was placing her medications in her pill box and noticed she was almost out of Carvedilol, she will have enough until next Saturday 04/14/2021, informed patient that I have a prescription to be delivered on 04/19/2021 and she should have enough until then, we began to review directions again on Carvedilol and per patient she is taking Carvedilol 12.5 mg twice a day even on Dialysis days but per prescription patient should be taking Carvedilol 12.5 mg once daily on Monday, Wednesday and Friday (Dialysis Days) and twice daily on the other days.  Clarification requested from Nephrology office, called Nephrology, I was directed to call dialysis Clinic due to the last provider not being with their practice, a provider at the [place]. [place], spoke with [place] Clap, they do not see that patient is taking Carvedilol and that their providers did not prescribe this medication and provider listed was not in at this time. Called [facility] to check if [place], [place] prescribed medication for patient, nurse reviewed chart and does not see that she did but Nephrologist last prescribed. Called patient with updates, PCP and CPP notified for clarification.   SIG: [name], [place] Pharmacist Assistant [phone]

## 2021-04-05 ENCOUNTER — Telehealth: Payer: Self-pay

## 2021-04-05 NOTE — Telephone Encounter (Signed)
[facility] called requesting refill on pantoprazole. Per CC, pt does not need to continue, only needed to be on short term.//ah

## 2021-04-06 ENCOUNTER — Ambulatory Visit (INDEPENDENT_AMBULATORY_CARE_PROVIDER_SITE_OTHER): Payer: Medicare Other

## 2021-04-06 ENCOUNTER — Telehealth: Payer: Medicare Other

## 2021-04-06 DIAGNOSIS — Z992 Dependence on renal dialysis: Secondary | ICD-10-CM | POA: Diagnosis not present

## 2021-04-06 DIAGNOSIS — I10 Essential (primary) hypertension: Secondary | ICD-10-CM | POA: Diagnosis not present

## 2021-04-06 DIAGNOSIS — E559 Vitamin D deficiency, unspecified: Secondary | ICD-10-CM

## 2021-04-06 DIAGNOSIS — N186 End stage renal disease: Secondary | ICD-10-CM | POA: Diagnosis not present

## 2021-04-06 DIAGNOSIS — I5032 Chronic diastolic (congestive) heart failure: Secondary | ICD-10-CM | POA: Diagnosis not present

## 2021-04-06 DIAGNOSIS — D509 Iron deficiency anemia, unspecified: Secondary | ICD-10-CM | POA: Diagnosis not present

## 2021-04-06 DIAGNOSIS — E875 Hyperkalemia: Secondary | ICD-10-CM | POA: Diagnosis not present

## 2021-04-06 DIAGNOSIS — N2581 Secondary hyperparathyroidism of renal origin: Secondary | ICD-10-CM | POA: Diagnosis not present

## 2021-04-06 DIAGNOSIS — D689 Coagulation defect, unspecified: Secondary | ICD-10-CM | POA: Diagnosis not present

## 2021-04-06 DIAGNOSIS — I700 Atherosclerosis of aorta: Secondary | ICD-10-CM

## 2021-04-09 DIAGNOSIS — N2581 Secondary hyperparathyroidism of renal origin: Secondary | ICD-10-CM | POA: Diagnosis not present

## 2021-04-09 DIAGNOSIS — E875 Hyperkalemia: Secondary | ICD-10-CM | POA: Diagnosis not present

## 2021-04-09 DIAGNOSIS — Z992 Dependence on renal dialysis: Secondary | ICD-10-CM | POA: Diagnosis not present

## 2021-04-09 DIAGNOSIS — D689 Coagulation defect, unspecified: Secondary | ICD-10-CM | POA: Diagnosis not present

## 2021-04-09 DIAGNOSIS — N186 End stage renal disease: Secondary | ICD-10-CM | POA: Diagnosis not present

## 2021-04-10 ENCOUNTER — Ambulatory Visit: Payer: 59 | Admitting: Student

## 2021-04-11 DIAGNOSIS — N186 End stage renal disease: Secondary | ICD-10-CM | POA: Diagnosis not present

## 2021-04-11 DIAGNOSIS — N2581 Secondary hyperparathyroidism of renal origin: Secondary | ICD-10-CM | POA: Diagnosis not present

## 2021-04-11 DIAGNOSIS — D689 Coagulation defect, unspecified: Secondary | ICD-10-CM | POA: Diagnosis not present

## 2021-04-11 DIAGNOSIS — Z992 Dependence on renal dialysis: Secondary | ICD-10-CM | POA: Diagnosis not present

## 2021-04-11 DIAGNOSIS — E875 Hyperkalemia: Secondary | ICD-10-CM | POA: Diagnosis not present

## 2021-04-11 NOTE — Telephone Encounter (Signed)
done

## 2021-04-12 ENCOUNTER — Telehealth: Payer: Self-pay

## 2021-04-12 ENCOUNTER — Other Ambulatory Visit: Payer: Self-pay

## 2021-04-12 ENCOUNTER — Other Ambulatory Visit: Payer: Self-pay | Admitting: Nurse Practitioner

## 2021-04-12 DIAGNOSIS — E559 Vitamin D deficiency, unspecified: Secondary | ICD-10-CM

## 2021-04-12 MED ORDER — VITAMIN D (ERGOCALCIFEROL) 1.25 MG (50000 UNIT) PO CAPS: 50000.0000 [IU] | ORAL_CAPSULE | ORAL | 1 refills | Status: DC

## 2021-04-13 DIAGNOSIS — E875 Hyperkalemia: Secondary | ICD-10-CM | POA: Diagnosis not present

## 2021-04-13 DIAGNOSIS — Z992 Dependence on renal dialysis: Secondary | ICD-10-CM | POA: Diagnosis not present

## 2021-04-13 DIAGNOSIS — N186 End stage renal disease: Secondary | ICD-10-CM | POA: Diagnosis not present

## 2021-04-13 DIAGNOSIS — D689 Coagulation defect, unspecified: Secondary | ICD-10-CM | POA: Diagnosis not present

## 2021-04-13 DIAGNOSIS — N2581 Secondary hyperparathyroidism of renal origin: Secondary | ICD-10-CM | POA: Diagnosis not present

## 2021-04-16 DIAGNOSIS — Z23 Encounter for immunization: Secondary | ICD-10-CM | POA: Diagnosis not present

## 2021-04-16 DIAGNOSIS — N2581 Secondary hyperparathyroidism of renal origin: Secondary | ICD-10-CM | POA: Diagnosis not present

## 2021-04-16 DIAGNOSIS — Z992 Dependence on renal dialysis: Secondary | ICD-10-CM | POA: Diagnosis not present

## 2021-04-16 DIAGNOSIS — D631 Anemia in chronic kidney disease: Secondary | ICD-10-CM | POA: Diagnosis not present

## 2021-04-16 DIAGNOSIS — N186 End stage renal disease: Secondary | ICD-10-CM | POA: Diagnosis not present

## 2021-04-16 DIAGNOSIS — D689 Coagulation defect, unspecified: Secondary | ICD-10-CM | POA: Diagnosis not present

## 2021-04-16 NOTE — Patient Instructions (Signed)
Goals Addressed      Other   .  Improve My Heart Health-Coronary Artery Disease   On track     Timeframe:  Long-Range Goal Priority:  High Start Date: 04/06/21                            Expected End Date: 10/05/21                     Follow Up Date: 05/22/21    - be open to making changes - I can manage, know and watch for signs of a heart attack - if I have chest pain, call for help - learn about small changes that will make a big difference - learn my personal risk factors    Why is this important?    Lifestyle changes are key to improving the blood flow to your heart. Think about the things you can change and set a goal to live healthy.   Remember, when the blood vessels to your heart start to get clogged you may not have any symptoms.   Over time, they can get worse.   Don't ignore the signs, like chest pain, and get help right away.     Notes:     [name]  Vitamin D defiency- improve or resolve   On track     Timeframe:  Long-Range Goal Priority:  High Start Date:  04/06/21                          Expected End Date: 10/05/21  Next Follow Up Date: 05/22/21  Self Care Activities:  . Continue to adhere to MD recommendations for treatment of Vitamin D deficiency   . Continue to keep all scheduled follow up appointments . Take medications as directed  . Let your healthcare team know if you are unable to take your medications . Call your pharmacy for refills at least 7 days prior to running out of medication Patient Goals: - to improve and or resolve Vitamin D deficiency

## 2021-04-16 NOTE — Chronic Care Management (AMB) (Signed)
Chronic Care Management   CCM RN Visit Note  04/06/2021 Name: Whitney Walker MRN: [id number] DOB: May 15, 1956  Subjective: Whitney Walker is a 65 y.o. year old female who is a primary care patient of [name], [place]. The care management team was consulted for assistance with disease management and care coordination needs.    Engaged with patient by telephone for follow up visit in response to provider referral for case management and/or care coordination services.   Consent to Services:  The patient was given information about Chronic Care Management services, agreed to services, and gave verbal consent prior to initiation of services.  Please see initial visit note for detailed documentation.   Patient agreed to services and verbal consent obtained.   Assessment: Review of patient past medical history, allergies, medications, health status, including review of consultants reports, laboratory and other test data, was performed as part of comprehensive evaluation and provision of chronic care management services.   SDOH (Social Determinants of Health) assessments and interventions performed:  Yes, no acute challenges   CCM Care Plan  Allergies  Allergen Reactions  . Carnosine     Other reaction(s): Unknown  . Gadolinium Derivatives Hives and Other (See Comments)    HIVES, Desc: HIVES W/ "DYE" USED FOR 1ST CT SCAN BUT NOT 2ND, NO PREMEDS USED, PT UNCERTAIN OF CIRCUMSTANCES,,?POSSIBLE MRI CONTRAST ALLERGY, ALL STUDIES DONE "SOMEWHERE" IN [place]//A.C., Onset Date: [id number]  . Iohexol Other (See Comments)     Code: HIVES, Desc: HIVES W/ "DYE" USED FOR 1ST CT SCAN BUT NOT 2ND, NO PREMEDS USED, PT UNCERTAIN OF CIRCUMSTANCES,,?POSSIBLE MRI CONTRAST ALLERGY, ALL STUDIES DONE "SOMEWHERE" IN [place]//A.C., Onset Date: [id number]   . Iodine Hives  . Naltrexone     Other reaction(s): Unknown    Outpatient Encounter Medications as of 04/06/2021  Medication Sig  .  acetaminophen (TYLENOL) 500 MG tablet Take 1,000 mg by mouth 2 (two) times daily as needed for moderate pain or headache.  . albuterol (PROVENTIL) (2.5 MG/3ML) 0.083% nebulizer solution Take 3 mLs (2.5 mg total) by nebulization every 4 (four) hours as needed for wheezing or shortness of breath. (Patient not taking: No sig reported)  . albuterol (VENTOLIN HFA) 108 (90 Base) MCG/ACT inhaler Inhale 2 puffs into the lungs every 6 (six) hours as needed for wheezing or shortness of breath.  [name] amLODipine (NORVASC) 5 MG tablet Take 1 tablet (5 mg total) by mouth daily.  [name] amoxicillin (AMOXIL) 500 MG tablet Take 1 tablet (500 mg total) by mouth 2 (two) times daily. (Patient not taking: No sig reported)  . apixaban (ELIQUIS) 2.5 MG TABS tablet Take 1 tablet (2.5 mg total) by mouth 2 (two) times daily.  . Ascorbic Acid (VITAMIN C) 100 MG tablet Take 100 mg by mouth daily.  [name] atorvastatin (LIPITOR) 10 MG tablet Take 1 tablet (10 mg total) by mouth daily.  . Biotin 10000 MCG TABS Take 10,000 mcg by mouth daily.   . carvedilol (COREG) 12.5 MG tablet Take 12.5 mg by mouth 2 (two) times daily with a meal.   . doxercalciferol (HECTOROL) 4 MCG/2ML injection Doxercalciferol (Hectorol)  . ibuprofen (ADVIL) 800 MG tablet Take 1 tablet (800 mg total) by mouth 3 (three) times daily.  [name] lanthanum (FOSRENOL) 1000 MG chewable tablet Chew 1,000 mg by mouth See admin instructions. Take 1000 mg with each meal and each snack  . multivitamin (RENA-VIT) TABS tablet Take 1 tablet by mouth at bedtime.  . mupirocin ointment (BACTROBAN) 2 % Apply  1 application topically daily as needed (skin bumps).  . nystatin (NYSTATIN) powder Apply 1 application topically 4 (four) times daily as needed. (Patient taking differently: Apply 1 application topically 4 (four) times daily as needed (irriation).)  . oxyCODONE-acetaminophen (PERCOCET) 5-325 MG tablet Take 2 tablets by mouth every 8 (eight) hours as needed for severe pain.  . pantoprazole  (PROTONIX) 40 MG tablet Take 1 tablet (40 mg total) by mouth daily.  . vitamin E 200 UNIT capsule Take 200 Units by mouth daily.  . [DISCONTINUED] meclizine (ANTIVERT) 12.5 MG tablet Take 1 tablet (12.5 mg total) by mouth 3 (three) times daily as needed for dizziness.   No facility-administered encounter medications on file as of 04/06/2021.    Patient Active Problem List   Diagnosis Date Noted  . Hyperkalemia 05/09/2020  . Left leg pain 05/09/2020  . Restless leg syndrome 09/19/2018  . OSA (obstructive sleep apnea) 04/29/2018  . ESRD on dialysis ([place]) 04/29/2018  . Dyspnea on exertion 03/12/2017  . Morbid (severe) obesity due to excess calories ([place]) 03/12/2017  . Anemia of chronic kidney failure, stage 3 (moderate) ([place]) 12/10/2016  . History of colon cancer 08/12/2016  . Mass of left breast on mammogram 12/10/2015  . HTN (hypertension) 10/18/2015  . Depression 10/18/2015  . Numbness and tingling of left arm and leg 01/24/2015  . PVD (peripheral vascular disease) with claudication ([place]) 03/23/2014  . PAD (peripheral artery disease) ([place]) 12/14/2013  . Chronic diastolic heart failure ([place]) 11/15/2013  . Essential hypertension 11/15/2013  . Leucocytosis 04/28/2012    Conditions to be addressed/monitored:ESRD, HTN, CHF, Vitamin D deficiency, Hyperkalemia, Aortic Atherosclerosis    Care Plan : Coronary Artery Disease (Adult)  Updates made by [name], RN since 04/16/2021 12:00 AM    Problem: Disease Progression (Coronary Artery Disease)   Priority: High    Long-Range Goal: Disease Progression Prevented or Minimized   Start Date: 04/06/2021  Expected End Date: 10/05/2021  This Visit's Progress: On track  Priority: High  Note:   Current Barriers:   Ineffective Self Health Maintenance  Clinical Goal(s):  [name] Collaboration with [name], FNP regarding development and update of comprehensive plan of care as evidenced by provider attestation and  co-signature . Inter-disciplinary care team collaboration (see longitudinal plan of care)  patient will work with care management team to address care coordination and chronic disease management needs related to Disease Management  Educational Needs  Care Coordination  Medication Management and Education  Psychosocial Support   Interventions:   Evaluation of current treatment plan related to CAD, self-management and patient's adherence to plan as established by provider.  Collaboration with [name], FNP regarding development and update of comprehensive plan of care as evidenced by provider attestation       and co-signature  Inter-disciplinary care team collaboration (see longitudinal plan of care)  Determined patient completed an evaluation with Cardiology on 03/01/21 with the following Assessment/Plan:  Suspicion for ACS is low as EKG is without acute ischemic changes or underlying injury pattern, as well patient symptoms are atypical at best, and rather suggest pericarditis as underlying etiology. In regard to coronary calcium score of greater than 4000, patient has severe levels of coronary calcium.  In view of patient's calcium score in the 99th percentile as well as her underlying risk factors for coronary artery disease recommend patient undergo cardiac catheterization. The left heart catheterization procedure was explained to the patient in detail. The indication, alternatives, risks and benefits were reviewed. Complications  including but not limited to bleeding, infection, acute kidney injury, blood transfusion, heart rhythm disturbances, contrast (dye) reaction, damage to the arteries or nerves in the legs or hands, cerebrovascular accident, myocardial infarction, need for emergent bypass surgery, blood clots in the legs, possible need for emergent blood transfusion, and rarely death were reviewed and discussed with the patient. The patient voices understanding and wishes to  proceed. In regard to hyperlipidemia, patient reports she was unable to tolerate increased dose of atorvastatin due to myalgias.  Will reduce atorvastatin from 20 mg to 10 mg daily.  Patient may benefit from addition of Zetia in the future, however she prefers to hold off at this time in view of ongoing chest pain. Counseled patient regarding increased risk of bleeding with ibuprofen commendation of Eliquis, patient verbalized understanding and agrees to notify our office of any bleeding diathesis.  Also again discussed smoking cessation and weight loss as well as CPAP compliance nightly. Follow-up in 4 weeks, sooner if needed, for pericarditis, severe coronary calcium score. Patient was seen in collaboration with Dr. Terri Skains. He also reviewed patient's chart and Dr. Terri Skains is in agreement of the plan.    Discussed and reviewed patient's scheduled date/time for Cardiac Catheterization procedure and determined patient is aware and verbalizes understanding of when to stop coagulation therapy prior to her procedure  Discussed plans with patient for ongoing care management follow up and provided patient with direct contact information for care management team Self Care Activities:   . - be open to making changes . - I can manage, know and watch for signs of a heart attack . - if I have chest pain, call for help . - learn about small changes that will make a big difference . - learn my personal risk factors Patient Goals: -to complete the Cardiac Catheterization as recommended   Follow Up Plan: Telephone follow up appointment with care management team member scheduled for: 05/22/21    Care Plan : Vitamin D deficiency  Updates made by [name], RN since 04/16/2021 12:00 AM    Problem: Vitamin D deficiency   Priority: High    Long-Range Goal: Vitamin D deficiency improved or resolved   Start Date: 04/06/2021  Expected End Date: 10/05/2021  This Visit's Progress: On track  Priority: High  Note:    Current Barriers:   Ineffective Self Health Maintenance  Clinical Goal(s):  [name] Collaboration with [name], FNP regarding development and update of comprehensive plan of care as evidenced by provider attestation and co-signature . Inter-disciplinary care team collaboration (see longitudinal plan of care)  patient will work with care management team to address care coordination and chronic disease management needs related to Disease Management  Educational Needs  Care Coordination  Medication Management and Education  Psychosocial Support   Interventions:  04/06/21 completed successful outbound call to patient   Evaluation of current treatment plan related to  Vitamin D deficiency , self-management and patient's adherence to plan as established by provider.  Collaboration with [name], FNP regarding development and update of comprehensive plan of care as evidenced by provider attestation       and co-signature  Inter-disciplinary care team collaboration (see longitudinal plan of care) . Provided education to patient about basic disease process related to Vitamin D deficiency . Review of patient status, including review of consultants reports, relevant laboratory and other test results, and medications completed. . Educated patient on treatment and dietary recommendations  . Reviewed medications with patient and discussed  importance of medication adherence; Determined patient is unable to tolerate the oral supplement due to experiencing severe nausea and abdominal pain when taking this medication . Sent Pharm D referral for counseling and determination of alternative supplement for Vitamin D dosing/frequency  . Sent secure message to PCP provider requesting a refill for   Discussed plans with patient for ongoing care management follow up and provided patient with direct contact information for care management team 04/16/21 placed successful outbound call to [place], [place] with [facility] for collaboration regarding renal recommendations for Vitamin D deficiency  . Determined per Lucita Ferrara RD and per Nephrologist, oral supplementation is appropriate for Ms. Blinder and should be considered   . Sent in basket message to embedded Pharm D [name] advising of this collaboration and asked for additional recommendations for supplementation/dosing since Ms. Oxendine is experiencing GI SE to the last dose prescribed Self Care Activities:  . Continue to adhere to MD recommendations for treatment of Vitamin D deficiency   . Continue to keep all scheduled follow up appointments . Take medications as directed  . Let your healthcare team know if you are unable to take your medications . Call your pharmacy for refills at least 7 days prior to running out of medication Patient Goals: - to improve and or resolve Vitamin D deficiency  Follow Up Plan: Telephone follow up appointment with care management team member scheduled for: 05/22/21    Plan:Telephone follow up appointment with care management team member scheduled for:  05/22/21  [name], RN, BSN, CCM Care Management Coordinator [facility]  Direct Phone: [phone]

## 2021-04-17 ENCOUNTER — Ambulatory Visit: Payer: 59 | Admitting: Student

## 2021-04-18 ENCOUNTER — Telehealth: Payer: Self-pay

## 2021-04-18 DIAGNOSIS — D689 Coagulation defect, unspecified: Secondary | ICD-10-CM | POA: Diagnosis not present

## 2021-04-18 DIAGNOSIS — N186 End stage renal disease: Secondary | ICD-10-CM | POA: Diagnosis not present

## 2021-04-18 DIAGNOSIS — D631 Anemia in chronic kidney disease: Secondary | ICD-10-CM | POA: Diagnosis not present

## 2021-04-18 DIAGNOSIS — N2581 Secondary hyperparathyroidism of renal origin: Secondary | ICD-10-CM | POA: Diagnosis not present

## 2021-04-18 DIAGNOSIS — Z992 Dependence on renal dialysis: Secondary | ICD-10-CM | POA: Diagnosis not present

## 2021-04-18 DIAGNOSIS — Z23 Encounter for immunization: Secondary | ICD-10-CM | POA: Diagnosis not present

## 2021-04-18 NOTE — Telephone Encounter (Signed)
Called Whitney Walker who reported she is expecting her medication to be delivered this week, because she postponed delivery. Patients reports that she did not take Vitamin D yet, but will start this week.  Will follow up with patient on 04/26/2021 during office visit.  Total Time 2 minutes    [name], PharmD Clinical Pharmacist [facility] [phone]

## 2021-04-20 DIAGNOSIS — Z23 Encounter for immunization: Secondary | ICD-10-CM | POA: Diagnosis not present

## 2021-04-20 DIAGNOSIS — N2581 Secondary hyperparathyroidism of renal origin: Secondary | ICD-10-CM | POA: Diagnosis not present

## 2021-04-20 DIAGNOSIS — D631 Anemia in chronic kidney disease: Secondary | ICD-10-CM | POA: Diagnosis not present

## 2021-04-20 DIAGNOSIS — D689 Coagulation defect, unspecified: Secondary | ICD-10-CM | POA: Diagnosis not present

## 2021-04-20 DIAGNOSIS — Z992 Dependence on renal dialysis: Secondary | ICD-10-CM | POA: Diagnosis not present

## 2021-04-20 DIAGNOSIS — N186 End stage renal disease: Secondary | ICD-10-CM | POA: Diagnosis not present

## 2021-04-23 DIAGNOSIS — D689 Coagulation defect, unspecified: Secondary | ICD-10-CM | POA: Diagnosis not present

## 2021-04-23 DIAGNOSIS — N186 End stage renal disease: Secondary | ICD-10-CM | POA: Diagnosis not present

## 2021-04-23 DIAGNOSIS — N2581 Secondary hyperparathyroidism of renal origin: Secondary | ICD-10-CM | POA: Diagnosis not present

## 2021-04-23 DIAGNOSIS — E875 Hyperkalemia: Secondary | ICD-10-CM | POA: Diagnosis not present

## 2021-04-23 DIAGNOSIS — Z992 Dependence on renal dialysis: Secondary | ICD-10-CM | POA: Diagnosis not present

## 2021-04-25 ENCOUNTER — Telehealth: Payer: Self-pay

## 2021-04-25 DIAGNOSIS — R072 Precordial pain: Secondary | ICD-10-CM | POA: Diagnosis not present

## 2021-04-25 DIAGNOSIS — Z992 Dependence on renal dialysis: Secondary | ICD-10-CM | POA: Diagnosis not present

## 2021-04-25 DIAGNOSIS — D689 Coagulation defect, unspecified: Secondary | ICD-10-CM | POA: Diagnosis not present

## 2021-04-25 DIAGNOSIS — I10 Essential (primary) hypertension: Secondary | ICD-10-CM | POA: Diagnosis not present

## 2021-04-25 DIAGNOSIS — E875 Hyperkalemia: Secondary | ICD-10-CM | POA: Diagnosis not present

## 2021-04-25 DIAGNOSIS — N186 End stage renal disease: Secondary | ICD-10-CM | POA: Diagnosis not present

## 2021-04-25 DIAGNOSIS — N2581 Secondary hyperparathyroidism of renal origin: Secondary | ICD-10-CM | POA: Diagnosis not present

## 2021-04-25 NOTE — Chronic Care Management (AMB) (Addendum)
Patient is aware of phone appointment with [name] on 04-26-2021 @ 1:00. Informed pt to have medications and any logs available.   [place]  [phone]

## 2021-04-26 ENCOUNTER — Ambulatory Visit: Payer: Medicare Other

## 2021-04-26 DIAGNOSIS — Z992 Dependence on renal dialysis: Secondary | ICD-10-CM | POA: Diagnosis not present

## 2021-04-26 DIAGNOSIS — I5032 Chronic diastolic (congestive) heart failure: Secondary | ICD-10-CM | POA: Diagnosis not present

## 2021-04-26 DIAGNOSIS — I10 Essential (primary) hypertension: Secondary | ICD-10-CM

## 2021-04-26 DIAGNOSIS — I739 Peripheral vascular disease, unspecified: Secondary | ICD-10-CM

## 2021-04-26 DIAGNOSIS — E559 Vitamin D deficiency, unspecified: Secondary | ICD-10-CM

## 2021-04-26 DIAGNOSIS — N186 End stage renal disease: Secondary | ICD-10-CM | POA: Diagnosis not present

## 2021-04-26 LAB — CBC
Hematocrit: 34.6 % (ref 34.0–46.6)
Hemoglobin: 11.8 g/dL (ref 11.1–15.9)
MCH: 30.3 pg (ref 26.6–33.0)
MCHC: 34.1 g/dL (ref 31.5–35.7)
MCV: 89 fL (ref 79–97)
Platelets: 183 10*3/uL (ref 150–450)
RBC: 3.89 x10E6/uL (ref 3.77–5.28)
RDW: 15.7 % — ABNORMAL HIGH (ref 11.7–15.4)
WBC: 11.2 10*3/uL — ABNORMAL HIGH (ref 3.4–10.8)

## 2021-04-26 LAB — BASIC METABOLIC PANEL
BUN/Creatinine Ratio: 3 — ABNORMAL LOW (ref 12–28)
BUN: 13 mg/dL (ref 8–27)
CO2: 28 mmol/L (ref 20–29)
Calcium: 9.3 mg/dL (ref 8.7–10.3)
Chloride: 93 mmol/L — ABNORMAL LOW (ref 96–106)
Creatinine, Ser: 4.34 mg/dL — ABNORMAL HIGH (ref 0.57–1.00)
Glucose: 139 mg/dL — ABNORMAL HIGH (ref 65–99)
Potassium: 4 mmol/L (ref 3.5–5.2)
Sodium: 135 mmol/L (ref 134–144)
eGFR: 11 mL/min/{1.73_m2} — ABNORMAL LOW (ref >59)

## 2021-04-26 NOTE — Progress Notes (Signed)
Chronic Care Management Pharmacy Note  04/27/2021 Name:  Whitney Walker MRN:  [id number] DOB:  1956-03-08  Subjective: Whitney Walker is an 65 y.o. year old female who is a primary patient of [name], [place].  The CCM team was consulted for assistance with disease management and care coordination needs.    Engaged with patient by telephone for follow up visit in response to provider referral for pharmacy case management and/or care coordination services.   Consent to Services:  The patient was given information about Chronic Care Management services, agreed to services, and gave verbal consent prior to initiation of services.  Please see initial visit note for detailed documentation.   Patient Care Team: [name], [place] as PCP - General (General Practice) Waynetta Sandy, MD as Consulting Physician (Vascular Surgery) [facility], [facility] as Referring Physician (Nephrology) Edrick Oh, MD as Consulting Physician (Nephrology) Truitt Merle, MD as Consulting Physician (Hematology) [facility], [place], RN as [facility] Cyril Mourning, [place] (Inactive) (Pharmacist)  Recent office visits: 03/15/2021 PCP OV   Recent consult visits: 04/03/2021 - Cardiology OV 03/01/2021- Cardiology [place]  ED  Visits: 02/05/2021 ED Visit  Objective:  Lab Results  Component Value Date   CREATININE 4.34 (H) 04/25/2021   BUN 13 04/25/2021   GFRNONAA 9 (L) 02/05/2021   GFRAA 7 (L) 05/18/2020   NA 135 04/25/2021   K 4.0 04/25/2021   CALCIUM 9.3 04/25/2021   CO2 28 04/25/2021   GLUCOSE 139 (H) 04/25/2021    Lab Results  Component Value Date/Time   HGBA1C 5.1 06/04/2018 12:00 AM   HGBA1C (H) 09/07/2009 04:45 PM    6.5 (NOTE) The ADA recommends the following therapeutic goal for glycemic control related to Hgb A1c measurement: Goal of therapy: <6.5 Hgb A1c  Reference: American Diabetes  Association: Clinical Practice Recommendations 2010, Diabetes Care, 2010, 33: (Suppl  1).    Last diabetic Eye exam: No results found for: HMDIABEYEEXA  Last diabetic Foot exam: No results found for: HMDIABFOOTEX   Lab Results  Component Value Date   CHOL 183 03/15/2021   HDL 55 03/15/2021   LDLCALC 100 (H) 03/15/2021   TRIG 159 (H) 03/15/2021   CHOLHDL 3.3 03/15/2021    Hepatic Function Latest Ref Rng & Units 03/15/2021 11/01/2020 10/31/2020  Total Protein 6.0 - 8.5 g/dL 7.4 - -  Albumin 3.8 - 4.8 g/dL 3.7(L) 2.9(L) 2.9(L)  AST 0 - 40 IU/L 19 - -  ALT 0 - 32 IU/L 19 - -  Alk Phosphatase 44 - 121 IU/L 110 - -  Total Bilirubin 0.0 - 1.2 mg/dL 0.4 - -  Bilirubin, Direct 0.1 - 0.5 mg/dL - - -    Lab Results  Component Value Date/Time   TSH 1.57 06/04/2018 12:00 AM    CBC Latest Ref Rng & Units 04/25/2021 03/15/2021 02/05/2021  WBC 3.4 - 10.8 x10E3/uL 11.2(H) 14.1(H) 13.7(H)  Hemoglobin 11.1 - 15.9 g/dL 11.8 11.0(L) 11.7(L)  Hematocrit 34.0 - 46.6 % 34.6 34.0 37.6  Platelets 150 - 450 x10E3/uL 183 212 240    Lab Results  Component Value Date/Time   VD25OH 9.1 (L) 03/15/2021 11:43 AM    Clinical ASCVD: Yes  The 10-year ASCVD risk score Mikey Bussing DC Jr., et al., 2013) is: 7.3%   Values used to calculate the score:     Age: 85 years     Sex: Female     Is  Non-Hispanic African American: Yes     Diabetic: No     Tobacco smoker: No     Systolic Blood Pressure: 893 mmHg     Is BP treated: Yes     HDL Cholesterol: 55 mg/dL     Total Cholesterol: 183 mg/dL    Depression screen [facility] 2/9 08/03/2020 01/04/2020 12/07/2019  Decreased Interest 0 0 0  Down, Depressed, Hopeless 0 0 0  PHQ - 2 Score 0 0 0  Altered sleeping - - -  Tired, decreased energy - - -  Change in appetite - - -  Feeling bad or failure about yourself  - - -  Trouble concentrating - - -  Moving slowly or fidgety/restless - - -  Suicidal thoughts - - -  PHQ-9 Score - - -  Difficult doing work/chores - - -  Some  recent data might be hidden     Social History   Tobacco Use  Smoking Status Former Smoker  . Packs/day: 0.50  . Years: 50.00  . Pack years: 25.00  . Types: Cigarettes  . Quit date: 02/03/2021  . Years since quitting: 0.2  Smokeless Tobacco Former [place]   has an occasional cigarette,    BP Readings from Last 3 Encounters:  03/15/21 120/82  03/01/21 128/75  02/06/21 114/63   Pulse Readings from Last 3 Encounters:  03/15/21 94  03/01/21 (!) 107  02/06/21 95   Wt Readings from Last 3 Encounters:  03/15/21 197 lb 3.2 oz (89.4 kg)  03/01/21 200 lb (90.7 kg)  11/14/20 200 lb (90.7 kg)   BMI Readings from Last 3 Encounters:  03/15/21 37.26 kg/m  03/01/21 37.79 kg/m  11/14/20 37.79 kg/m    Assessment/Interventions: Review of patient past medical history, allergies, medications, health status, including review of consultants reports, laboratory and other test data, was performed as part of comprehensive evaluation and provision of chronic care management services.   SDOH:  (Social Determinants of Health) assessments and interventions performed: No  SDOH Screenings   Alcohol Screen: Not on file  Depression (PHQ2-9): Low Risk   . PHQ-2 Score: 0  Financial Resource Strain: Low Risk   . Difficulty of Paying Living Expenses: Not hard at all  Food Insecurity: No Food Insecurity  . Worried About Charity fundraiser in the Last Year: Never true  . Ran Out of Food in the Last Year: Never true  Housing: Not on file  Physical Activity: Inactive  . Days of Exercise per Week: 0 days  . Minutes of Exercise per Session: 0 min  Social Connections: Not on file  Stress: No Stress Concern Present  . Feeling of Stress : Not at all  Tobacco Use: Medium Risk  . Smoking Tobacco Use: Former Smoker  . Smokeless Tobacco Use: Former Soil scientist Needs: No Transportation Needs  . Lack of Transportation (Medical): No  . Lack of Transportation (Non-Medical): No     CCM Care Plan  Allergies  Allergen Reactions  . Carnosine     Other reaction(s): Unknown  . Gadolinium Derivatives Hives and Other (See Comments)    HIVES, Desc: HIVES W/ "DYE" USED FOR 1ST CT SCAN BUT NOT 2ND, NO PREMEDS USED, PT UNCERTAIN OF CIRCUMSTANCES,,?POSSIBLE MRI CONTRAST ALLERGY, ALL STUDIES DONE "SOMEWHERE" IN [place]//A.C., Onset Date: 81017510  . Iohexol Other (See Comments)     Code: HIVES, Desc: HIVES W/ "DYE" USED FOR 1ST CT SCAN BUT NOT 2ND, NO PREMEDS USED, PT UNCERTAIN OF CIRCUMSTANCES,,?POSSIBLE  MRI CONTRAST ALLERGY, ALL STUDIES DONE "SOMEWHERE" IN [place]//A.C., Onset Date: 93570177   . Iodine Hives  . Naltrexone     Other reaction(s): Unknown    Medications Reviewed Today    Reviewed by Mayford Knife, RPH (Pharmacist) on 04/26/21 at 1432  Med List Status: <None>  Medication Order Taking? Sig Documenting Provider Last Dose Status Informant  acetaminophen (TYLENOL) 500 MG tablet [id number]  Take 1,000 mg by mouth 2 (two) times daily as needed for moderate pain or headache. [provider]  Active Self  albuterol (PROVENTIL) (2.5 MG/3ML) 0.083% nebulizer solution [id number]  Take 3 mLs (2.5 mg total) by nebulization every 4 (four) hours as needed for wheezing or shortness of breath.  Patient not taking: No sig reported   [name], FNP  Active Self  albuterol (VENTOLIN HFA) 108 (90 Base) MCG/ACT inhaler [id number] No Inhale 2 puffs into the lungs every 6 (six) hours as needed for wheezing or shortness of breath.  Patient not taking: Reported on 04/26/2021   [name], FNP Not Taking Active Self  amLODipine (NORVASC) 5 MG tablet [id number] Yes Take 1 tablet (5 mg total) by mouth daily. [name], FNP Taking Active Self  amoxicillin (AMOXIL) 500 MG tablet [id number]  Take 1 tablet (500 mg total) by mouth 2 (two) times daily.  Patient not taking: No sig reported   [name], FNP  Active Self  apixaban (ELIQUIS) 2.5 MG TABS tablet  [id number] Yes Take 1 tablet (2.5 mg total) by mouth 2 (two) times daily. Truitt Merle, MD Taking Active Self  Ascorbic Acid (VITAMIN C) 100 MG tablet [id number] Yes Take 100 mg by mouth daily. [provider] Taking Active Self  atorvastatin (LIPITOR) 10 MG tablet [id number]  Take 1 tablet (10 mg total) by mouth daily. Verneda Skill  Active Self  Biotin 10000 MCG TABS [id number] Yes Take 10,000 mcg by mouth daily.  [provider] Taking Active Self           Med Note Mackey Birchwood May 09, 2020  2:58 PM)    carvedilol (COREG) 12.5 MG tablet [id number] Yes Take 12.5 mg by mouth 2 (two) times daily with a meal.  [provider] Taking Active Self           Med Note Oren Section   Tue May 09, 2020  2:56 PM)    doxercalciferol (HECTOROL) 4 MCG/2ML injection [id number] Yes Doxercalciferol (Hectorol) [provider] Taking Active Self  ibuprofen (ADVIL) 800 MG tablet [id number]  Take 1 tablet (800 mg total) by mouth 3 (three) times daily. Cantwell, Celeste C, PA-C  Active Self  lanthanum (FOSRENOL) 1000 MG chewable tablet [id number] Yes Chew 1,000 mg by mouth See admin instructions. Take 1000 mg with each meal and each snack [provider] Taking Active Self  meclizine (ANTIVERT) 12.5 MG tablet [id number] Yes Take 1 tablet (12.5 mg total) by mouth 3 (three) times daily as needed for dizziness. Glendale Chard, MD Taking Active   multivitamin (RENA-VIT) TABS tablet [id number] Yes Take 1 tablet by mouth at bedtime. Edrick Oh, MD Taking Active Self           Med Note Mackey Birchwood May 09, 2020  2:58 PM)    mupirocin ointment (BACTROBAN) 2 % [id number] Yes Apply 1 application topically daily as needed (skin bumps). [name], FNP Taking Active Self           Med Note (  Oren Section   Tue May 09, 2020  2:56 PM)    nystatin (NYSTATIN) powder [id number] Yes Apply 1 application topically 4 (four) times daily as needed.  Patient taking  differently: Apply 1 application topically 4 (four) times daily as needed (irriation).   [name], FNP Taking Active   oxyCODONE-acetaminophen (PERCOCET) 5-325 MG tablet [id number]  Take 2 tablets by mouth every 8 (eight) hours as needed for severe pain. Mesner, Corene Cornea, MD  Active Self  pantoprazole (PROTONIX) 40 MG tablet [id number]  Take 1 tablet (40 mg total) by mouth daily. Cantwell, Celeste C, PA-C  Active Self  Vitamin D, Ergocalciferol, (DRISDOL) 1.25 MG (50000 UNIT) CAPS capsule [id number] Yes Take 1 capsule (50,000 Units total) by mouth every 7 (seven) days. [name], FNP Taking Active   vitamin E 200 UNIT capsule [id number] Yes Take 200 Units by mouth daily. [provider] Taking Active Self          Patient Active Problem List   Diagnosis Date Noted  . Hyperkalemia 05/09/2020  . Left leg pain 05/09/2020  . Restless leg syndrome 09/19/2018  . OSA (obstructive sleep apnea) 04/29/2018  . ESRD on dialysis ([place]) 04/29/2018  . Dyspnea on exertion 03/12/2017  . Morbid (severe) obesity due to excess calories ([place]) 03/12/2017  . Anemia of chronic kidney failure, stage 3 (moderate) ([place]) 12/10/2016  . History of colon cancer 08/12/2016  . Mass of left breast on mammogram 12/10/2015  . HTN (hypertension) 10/18/2015  . Depression 10/18/2015  . Numbness and tingling of left arm and leg 01/24/2015  . PVD (peripheral vascular disease) with claudication ([place]) 03/23/2014  . PAD (peripheral artery disease) ([place]) 12/14/2013  . Chronic diastolic heart failure ([place]) 11/15/2013  . Essential hypertension 11/15/2013  . Leucocytosis 04/28/2012    Immunization History  Administered Date(s) Administered  . Fluad Quad(high Dose 65+) 10/04/2019  . Moderna Sars-Covid-2 Vaccination 03/10/2020, 04/07/2020    Conditions to be addressed/monitored:  Hypertension and Hyperlipidemia  Care Plan : [place]  Updates made by Mayford Knife, RPH since 04/27/2021 12:00 AM     Problem: HTN, HLD, Vitamin D Deficiency   Priority: High    Long-Range Goal: Disease Management   This Visit's Progress: On track  Priority: High  Note:    Current Barriers:  . Unable to independently monitor therapeutic efficacy . Unable to achieve control of cholesterol.    Pharmacist Clinical Goal(s):  [name] Patient will achieve adherence to monitoring guidelines and medication adherence to achieve therapeutic efficacy through collaboration with PharmD and provider.   Interventions: . 1:1 collaboration with [name], FNP regarding development and update of comprehensive plan of care as evidenced by provider attestation and co-signature . Inter-disciplinary care team collaboration (see longitudinal plan of care) . Comprehensive medication review performed; medication list updated in electronic medical record  Hypertension (BP goal <130/80) -Uncontrolled -Current treatment: . Carvedilol 12.5 mg tablet twice per day  . Amlodipine 5 mg tablet once per day -Current home readings: patient reports that her BP readings are good at home, she does not have access to her readings.  -Current dietary habits: patient reports that she has a healthy appetite.  -Current exercise habits: patient reports that she does not exercise -Denies hypotensive/hypertensive symptoms -Educated on BP goals and benefits of medications for prevention of heart attack, stroke and kidney damage; Exercise goal of 150 minutes per week; Importance of home blood pressure monitoring; Proper BP monitoring technique; -Counseled to monitor BP at home  at least once per day, document, and provide log at future appointments -Counseled on diet and exercise extensively Recommended to continue current medication  Peripheral Arterial Disease: (LDL goal < 70) -Uncontrolled -Current treatment:  - Carvedilol 12.5 mg tablet two times per day  - Apixiban 2.5 mg tablet twice per day   - Amlodipine 5 mg tablet once per day  -  Atorvastatin 10 mg: taking 1 tablet daily   -Patient reports that she stopped taking the atorvastatin because of headaches, when this happened the cardiologist reduced the dose to Atorvastatin 10 mg tablet daily. Patient reports still having side effects.  -Medications previously tried: Atorvastatin 20 mg tablet daily  -Educated on Cholesterol goals;  Importance of limiting foods high in cholesterol; Exercise goal of 150 minutes per week; -Counseled on diet and exercise extensively Recommended to continue current medication Collaborated with PCP team to change patients  medication to Atorvastatin 10 mg tablet every other day.   Vitamin D Deficiency  -Uncontrolled -Current treatment  . Vitamin D 50, 000 units once per week.  -Recommended patient be started on Vitamin D replacement.  -Collaborated with PCP and patient was started on medication. -Was contacted by RN: Glenard Haring in regards to Vitamin D intolerance, when I contacted patient she reported that she had not started medication as of yet please see past telephone note, on 04/18/2021.  -Patient has since had first dose of Vitamin D and reports that she has been fine, no issues or side effects to report.  -At this time there are no changes that need to be made to patients current medication regimen.  -Recommended to continue current medication Recommended patient have labs drawn, recheck 25(OH)D, between 6-12 weeks from starting Vitamin D.   Patient Goals/Self-Care Activities . Patient will:  - take medications as prescribed check blood pressure at least twice per week, document, and provide at future appointments  Follow Up Plan: The patient has been provided with contact information for the care management team and has been advised to call with any health related questions or concerns.       Medication Assistance: None required.  Patient affirms current coverage meets needs.  [name] preferred pharmacy is:  [facility] -  [place], [place] - [address] [place] [address] Phone: [phone] Fax: [phone]  Uses pill box? Yes Pt endorses 90% compliance  We discussed: Benefits of medication synchronization, packaging and delivery as well as enhanced pharmacist oversight with [facility]. Patient decided to: Utilize [facility] pharmacy for medication synchronization, packaging and delivery  Care Plan and Follow Up Patient Decision:  Patient agrees to Care Plan and Follow-up.  Plan: The patient has been provided with contact information for the care management team and has been advised to call with any health related questions or concerns.   [name], PharmD Clinical Pharmacist [facility] [phone]

## 2021-04-27 DIAGNOSIS — E875 Hyperkalemia: Secondary | ICD-10-CM | POA: Diagnosis not present

## 2021-04-27 DIAGNOSIS — N2581 Secondary hyperparathyroidism of renal origin: Secondary | ICD-10-CM | POA: Diagnosis not present

## 2021-04-27 DIAGNOSIS — Z992 Dependence on renal dialysis: Secondary | ICD-10-CM | POA: Diagnosis not present

## 2021-04-27 DIAGNOSIS — D689 Coagulation defect, unspecified: Secondary | ICD-10-CM | POA: Diagnosis not present

## 2021-04-27 DIAGNOSIS — N186 End stage renal disease: Secondary | ICD-10-CM | POA: Diagnosis not present

## 2021-04-27 NOTE — Patient Instructions (Signed)
Visit Information It was great speaking with you today!  Please let me know if you have any questions about our visit.  Goals Addressed            This Visit's Progress   . Manage My Medicine       Timeframe:  Long-Range Goal Priority:  High Start Date:                             Expected End Date:                       Follow Up Date 06/14/2021   - call for medicine refill 2 or 3 days before it runs out - keep a list of all the medicines I take; vitamins and herbals too - learn to read medicine labels - use a pillbox to sort medicine - use an alarm clock or phone to remind me to take my medicine    Why is this important?   . These steps will help you keep on track with your medicines.        Patient Care Plan: CCM Pharmacy Care Plan    Problem Identified: HTN, HLD, Vitamin D Deficiency   Priority: High    Long-Range Goal: Disease Management   This Visit's Progress: On track  Priority: High  Note:    Current Barriers:  . Unable to independently monitor therapeutic efficacy . Unable to achieve control of cholesterol.    Pharmacist Clinical Goal(s):  [name] Patient will achieve adherence to monitoring guidelines and medication adherence to achieve therapeutic efficacy through collaboration with PharmD and provider.   Interventions: . 1:1 collaboration with [name], FNP regarding development and update of comprehensive plan of care as evidenced by provider attestation and co-signature . Inter-disciplinary care team collaboration (see longitudinal plan of care) . Comprehensive medication review performed; medication list updated in electronic medical record  Hypertension (BP goal <130/80) -Uncontrolled -Current treatment: . Carvedilol 12.5 mg tablet twice per day  . Amlodipine 5 mg tablet once per day -Current home readings: patient reports that her BP readings are good at home, she does not have access to her readings.  -Current dietary habits: patient reports  that she has a healthy appetite.  -Current exercise habits: patient reports that she does not exercise -Denies hypotensive/hypertensive symptoms -Educated on BP goals and benefits of medications for prevention of heart attack, stroke and kidney damage; Exercise goal of 150 minutes per week; Importance of home blood pressure monitoring; Proper BP monitoring technique; -Counseled to monitor BP at home at least once per day, document, and provide log at future appointments -Counseled on diet and exercise extensively Recommended to continue current medication  Peripheral Arterial Disease: (LDL goal < 70) -Uncontrolled -Current treatment:  - Carvedilol 12.5 mg tablet two times per day  - Apixiban 2.5 mg tablet twice per day   - Amlodipine 5 mg tablet once per day  - Atorvastatin 10 mg: taking 1 tablet daily   -Patient reports that she stopped taking the atorvastatin because of headaches, when this happened the cardiologist reduced the dose to Atorvastatin 10 mg tablet daily. Patient reports still having side effects.  -Medications previously tried: Atorvastatin 20 mg tablet daily  -Educated on Cholesterol goals;  Importance of limiting foods high in cholesterol; Exercise goal of 150 minutes per week; -Counseled on diet and exercise extensively Recommended to continue current medication Collaborated with PCP team to  change patients  medication to Atorvastatin 10 mg tablet every other day.   Vitamin D Deficiency  -Uncontrolled -Current treatment  . Vitamin D 50, 000 units once per week.  -Recommended patient be started on Vitamin D replacement.  -Collaborated with PCP and patient was started on medication. -Was contacted by RN: Glenard Haring in regards to Vitamin D intolerance, when I contacted patient she reported that she had not started medication as of yet please see past telephone note, on 04/18/2021.  -Patient has since had first dose of Vitamin D and reports that she has been fine, no issues  or side effects to report.  -At this time there are no changes that need to be made to patients current medication regimen.  -Recommended to continue current medication Recommended patient have labs drawn, recheck 25(OH)D, between 6-12 weeks from starting Vitamin D.   Patient Goals/Self-Care Activities . Patient will:  - take medications as prescribed check blood pressure at least twice per week, document, and provide at future appointments  Follow Up Plan: The patient has been provided with contact information for the care management team and has been advised to call with any health related questions or concerns.        Patient agreed to services and verbal consent obtained.   The patient verbalized understanding of instructions, educational materials, and care plan provided today and agreed to receive a mailed copy of patient instructions, educational materials, and care plan.   [name], PharmD Clinical Pharmacist [facility] [phone]

## 2021-04-28 DIAGNOSIS — I129 Hypertensive chronic kidney disease with stage 1 through stage 4 chronic kidney disease, or unspecified chronic kidney disease: Secondary | ICD-10-CM | POA: Diagnosis not present

## 2021-04-28 DIAGNOSIS — N186 End stage renal disease: Secondary | ICD-10-CM | POA: Diagnosis not present

## 2021-04-28 DIAGNOSIS — Z992 Dependence on renal dialysis: Secondary | ICD-10-CM | POA: Diagnosis not present

## 2021-04-30 DIAGNOSIS — E875 Hyperkalemia: Secondary | ICD-10-CM | POA: Diagnosis not present

## 2021-04-30 DIAGNOSIS — Z992 Dependence on renal dialysis: Secondary | ICD-10-CM | POA: Diagnosis not present

## 2021-04-30 DIAGNOSIS — D689 Coagulation defect, unspecified: Secondary | ICD-10-CM | POA: Diagnosis not present

## 2021-04-30 DIAGNOSIS — N2581 Secondary hyperparathyroidism of renal origin: Secondary | ICD-10-CM | POA: Diagnosis not present

## 2021-04-30 DIAGNOSIS — N186 End stage renal disease: Secondary | ICD-10-CM | POA: Diagnosis not present

## 2021-05-01 ENCOUNTER — Ambulatory Visit (HOSPITAL_COMMUNITY)
Admission: RE | Admit: 2021-05-01 | Discharge: 2021-05-01 | Disposition: A | Discharge status: Home / Self Care | Payer: Medicare Other | Source: Ambulatory Visit | Attending: Cardiology | Admitting: Cardiology

## 2021-05-01 ENCOUNTER — Other Ambulatory Visit: Payer: Self-pay

## 2021-05-01 ENCOUNTER — Encounter (HOSPITAL_COMMUNITY)
Admission: RE | Disposition: A | Discharge status: Home / Self Care | Payer: Self-pay | Source: Ambulatory Visit | Attending: Cardiology

## 2021-05-01 DIAGNOSIS — I2510 Atherosclerotic heart disease of native coronary artery without angina pectoris: Secondary | ICD-10-CM

## 2021-05-01 DIAGNOSIS — Z7901 Long term (current) use of anticoagulants: Secondary | ICD-10-CM | POA: Insufficient documentation

## 2021-05-01 DIAGNOSIS — Z87891 Personal history of nicotine dependence: Secondary | ICD-10-CM | POA: Diagnosis not present

## 2021-05-01 DIAGNOSIS — D6859 Other primary thrombophilia: Secondary | ICD-10-CM | POA: Insufficient documentation

## 2021-05-01 DIAGNOSIS — N186 End stage renal disease: Secondary | ICD-10-CM | POA: Insufficient documentation

## 2021-05-01 DIAGNOSIS — Z20822 Contact with and (suspected) exposure to covid-19: Secondary | ICD-10-CM | POA: Insufficient documentation

## 2021-05-01 DIAGNOSIS — I739 Peripheral vascular disease, unspecified: Secondary | ICD-10-CM | POA: Diagnosis not present

## 2021-05-01 DIAGNOSIS — I509 Heart failure, unspecified: Secondary | ICD-10-CM | POA: Diagnosis not present

## 2021-05-01 DIAGNOSIS — G4733 Obstructive sleep apnea (adult) (pediatric): Secondary | ICD-10-CM | POA: Insufficient documentation

## 2021-05-01 DIAGNOSIS — Z992 Dependence on renal dialysis: Secondary | ICD-10-CM | POA: Diagnosis not present

## 2021-05-01 DIAGNOSIS — I132 Hypertensive heart and chronic kidney disease with heart failure and with stage 5 chronic kidney disease, or end stage renal disease: Secondary | ICD-10-CM | POA: Diagnosis not present

## 2021-05-01 DIAGNOSIS — E785 Hyperlipidemia, unspecified: Secondary | ICD-10-CM | POA: Diagnosis not present

## 2021-05-01 DIAGNOSIS — R0789 Other chest pain: Secondary | ICD-10-CM

## 2021-05-01 DIAGNOSIS — I82412 Acute embolism and thrombosis of left femoral vein: Secondary | ICD-10-CM

## 2021-05-01 DIAGNOSIS — I209 Angina pectoris, unspecified: Secondary | ICD-10-CM | POA: Diagnosis present

## 2021-05-01 HISTORY — PX: LEFT HEART CATH AND CORONARY ANGIOGRAPHY: CATH118249

## 2021-05-01 LAB — SARS CORONAVIRUS 2 BY RT PCR (HOSPITAL ORDER, PERFORMED IN ~~LOC~~ HOSPITAL LAB): SARS Coronavirus 2: NEGATIVE

## 2021-05-01 SURGERY — LEFT HEART CATH AND CORONARY ANGIOGRAPHY
Anesthesia: LOCAL

## 2021-05-01 MED ORDER — FENTANYL CITRATE (PF) 100 MCG/2ML IJ SOLN
INTRAMUSCULAR | Status: DC | PRN
  Administered 2021-05-01: 25 ug via INTRAVENOUS

## 2021-05-01 MED ORDER — ACETAMINOPHEN 325 MG PO TABS: 650.0000 mg | ORAL_TABLET | ORAL | Status: DC | PRN

## 2021-05-01 MED ORDER — FAMOTIDINE IN NACL 20-0.9 MG/50ML-% IV SOLN
20.0000 mg | Freq: Once | INTRAVENOUS | Status: AC
  Administered 2021-05-01: 20 mg via INTRAVENOUS
  Filled 2021-05-01: qty 50

## 2021-05-01 MED ORDER — SODIUM CHLORIDE 0.9 % IV SOLN: 250.0000 mL | INTRAVENOUS | Status: DC | PRN

## 2021-05-01 MED ORDER — LIDOCAINE HCL (PF) 1 % IJ SOLN
INTRAMUSCULAR | Status: AC
  Filled 2021-05-01: qty 30

## 2021-05-01 MED ORDER — LABETALOL HCL 5 MG/ML IV SOLN: 10.0000 mg | INTRAVENOUS | Status: DC | PRN

## 2021-05-01 MED ORDER — FENTANYL CITRATE (PF) 100 MCG/2ML IJ SOLN
INTRAMUSCULAR | Status: AC
  Filled 2021-05-01: qty 2

## 2021-05-01 MED ORDER — HYDRALAZINE HCL 20 MG/ML IJ SOLN: 10.0000 mg | INTRAMUSCULAR | Status: DC | PRN

## 2021-05-01 MED ORDER — SODIUM CHLORIDE 0.9% FLUSH: 3.0000 mL | INTRAVENOUS | Status: DC | PRN

## 2021-05-01 MED ORDER — SODIUM CHLORIDE 0.9 % WEIGHT BASED INFUSION: 1.0000 mL/kg/h | INTRAVENOUS | Status: DC

## 2021-05-01 MED ORDER — ASPIRIN 81 MG PO CHEW
81.0000 mg | CHEWABLE_TABLET | ORAL | Status: AC
  Administered 2021-05-01: 81 mg via ORAL
  Filled 2021-05-01: qty 1

## 2021-05-01 MED ORDER — MIDAZOLAM HCL 2 MG/2ML IJ SOLN
INTRAMUSCULAR | Status: AC
  Filled 2021-05-01: qty 2

## 2021-05-01 MED ORDER — APIXABAN 2.5 MG PO TABS: 2.5000 mg | ORAL_TABLET | Freq: Two times a day (BID) | ORAL | 3 refills | Status: DC

## 2021-05-01 MED ORDER — IOHEXOL 350 MG/ML SOLN
INTRAVENOUS | Status: DC | PRN
  Administered 2021-05-01: 35 mL

## 2021-05-01 MED ORDER — SODIUM CHLORIDE 0.9 % WEIGHT BASED INFUSION: 3.0000 mL/kg/h | INTRAVENOUS | Status: DC

## 2021-05-01 MED ORDER — METHYLPREDNISOLONE SODIUM SUCC 125 MG IJ SOLR
125.0000 mg | Freq: Once | INTRAMUSCULAR | Status: AC
  Administered 2021-05-01: 125 mg via INTRAVENOUS
  Filled 2021-05-01: qty 2

## 2021-05-01 MED ORDER — DIPHENHYDRAMINE HCL 50 MG/ML IJ SOLN
25.0000 mg | Freq: Once | INTRAMUSCULAR | Status: AC
  Administered 2021-05-01: 25 mg via INTRAVENOUS
  Filled 2021-05-01: qty 1

## 2021-05-01 MED ORDER — SODIUM CHLORIDE 0.9% FLUSH: 3.0000 mL | Freq: Two times a day (BID) | INTRAVENOUS | Status: DC

## 2021-05-01 MED ORDER — SODIUM CHLORIDE 0.9 % IV SOLN: INTRAVENOUS | Status: DC

## 2021-05-01 MED ORDER — LIDOCAINE HCL (PF) 1 % IJ SOLN
INTRAMUSCULAR | Status: DC | PRN
  Administered 2021-05-01: 20 mL

## 2021-05-01 MED ORDER — HEPARIN (PORCINE) IN NACL 1000-0.9 UT/500ML-% IV SOLN
INTRAVENOUS | Status: AC
  Filled 2021-05-01: qty 500

## 2021-05-01 MED ORDER — MIDAZOLAM HCL 2 MG/2ML IJ SOLN
INTRAMUSCULAR | Status: DC | PRN
  Administered 2021-05-01: 2 mg via INTRAVENOUS

## 2021-05-01 MED ORDER — ONDANSETRON HCL 4 MG/2ML IJ SOLN: 4.0000 mg | Freq: Four times a day (QID) | INTRAMUSCULAR | Status: DC | PRN

## 2021-05-01 MED ORDER — HEPARIN (PORCINE) IN NACL 1000-0.9 UT/500ML-% IV SOLN
INTRAVENOUS | Status: DC | PRN
  Administered 2021-05-01 (×2): 500 mL

## 2021-05-01 SURGICAL SUPPLY — 10 items
CATH INFINITI 5FR MPB2 (CATHETERS) ×2 IMPLANT
CLOSURE MYNX CONTROL 5F (Vascular Products) ×2 IMPLANT
KIT HEART LEFT (KITS) ×2 IMPLANT
KIT MICROINTRODUCER 5F 7206 (SHEATH) ×2 IMPLANT
PACK CARDIAC CATHETERIZATION (CUSTOM PROCEDURE TRAY) ×2 IMPLANT
SHEATH PINNACLE 5F 10CM (SHEATH) ×2 IMPLANT
SHEATH PROBE COVER 6X72 (BAG) ×2 IMPLANT
TRANSDUCER W/STOPCOCK (MISCELLANEOUS) ×2 IMPLANT
TUBING CIL FLEX 10 FLL-RA (TUBING) ×2 IMPLANT
WIRE EMERALD 3MM-J .035X150CM (WIRE) ×2 IMPLANT

## 2021-05-01 NOTE — H&P (Signed)
Primary Physician/Referring:  [name], FNP  Patient ID: Whitney Walker, female    DOB: Jul 02, 1956, 65 y.o.   MRN: [id number]   CC: Chest pain  HPI:    Whitney Walker  is a 65 y.o. African-American female with history of obesity, hypertension, hyperlipidemia, tobacco use, stage IV kidney disease on dialysis, obstructive sleep apnea not on CPAP, COPD and severe peripheral arterial disease with history of left femoral-popliteal bypass surgery by Dr. Victorino Dike in 2014.  Patient is on chronic anticoagulation with Eliquis due to recurrent DVT and PE in 2014 and 2016.  Patient is followed by hematology for hypercoagulable state.  She is currently undergoing hemodialysis on a Monday/Wednesday/Friday schedule.  Patient presents for follow-up with complaints of continued precordial and arm pain.  She has had multiple emergency room visits, and nuclear stress test was set up however patient unable to perform the stress test.  She is now scheduled for cardiac catheterization.  Denies palpitations, syncope, near syncope, leg swelling, orthopnea, PND.  Notably she remains noncompliant with CPAP.  She has no new complaints or questions today.  Past Medical History:  Diagnosis Date  . Anemia   . Anxiety   . CHF (congestive heart failure) ([place])   . Colon cancer [facility])    treatment surgery  . Complication of anesthesia    after first [facility]- [place] "couldnt walk after", patient denies having a spinal  . COPD (chronic obstructive pulmonary disease) ([place])   . Coronary artery disease   . Depression   . DVT (deep venous thrombosis) ([place])   . ESRD (end stage renal disease) ([place])    Hemo: MWF  . History of blood transfusion 04/2018  . Hypertension    07/07/18- no longer takes BP medications  . Meningitis   . Pain in limb 07/30/2013  . PE (pulmonary embolism)   . Peripheral vascular disease ([place])   . Restless legs   . Shortness of breath    with exertion  . Sleep apnea   . SOB (shortness of  breath) 03/03/2019  . Vertigo    Past Surgical History:  Procedure Laterality Date  . ABDOMINAL AORTAGRAM N/A 07/26/2013   Procedure: ABDOMINAL Maxcine Ham;  Surgeon: [name] Loyall, MD;  Location: [facility] CATH LAB;  Service: Cardiovascular;  Laterality: N/A;  . ABDOMINAL HYSTERECTOMY    . AV FISTULA PLACEMENT Left 05/05/2018   Procedure: ARTERIOVENOUS (AV) FISTULA CREATION BRACHIOCEPHALIC;  Surgeon: Waynetta Sandy, MD;  Location: [place];  Service: Vascular;  Laterality: Left;  . AV FISTULA PLACEMENT Left 07/16/2018   Procedure: ARTERIOVENOUS FISTULA CREATION;  Surgeon: Waynetta Sandy, MD;  Location: [place];  Service: Vascular;  Laterality: Left;  . BASCILIC VEIN TRANSPOSITION Left 09/03/2018   Procedure: BASILIC VEIN TRANSPOSITION SECOND STAGE;  Surgeon: Waynetta Sandy, MD;  Location: [place];  Service: Vascular;  Laterality: Left;  . BREAST BIOPSY Left   . CESAREAN SECTION     X 3 [id number]  . CHOLECYSTECTOMY    . CHOLECYSTECTOMY  1980/s  . COLECTOMY  2010  . DIVERTICULOSIS SURGERY-2002  2012  . FEMORAL-POPLITEAL BYPASS GRAFT Left 08/13/2013   Procedure: BYPASS GRAFT FEMORAL-POPLITEAL ARTERY WITH NON-REVERSED SAPHANEOUS VEIN; ULTRASOUND GUIDED;  Surgeon: Mal [name], MD;  Location: [place];  Service: Vascular;  Laterality: Left;  . INSERTION OF DIALYSIS CATHETER Right 04/30/2018   Procedure: INSERTION OF Right Internal Jugular DIALYSIS CATHETER;  Surgeon: [name], MD;  Location: [place];  Service: Vascular;  Laterality: Right;  .  IR FLUORO GUIDE CV LINE RIGHT  07/24/2020  . IR REMOVAL TUN CV CATH W/O FL  07/21/2020  . IR US GUIDE VASC ACCESS RIGHT  07/24/2020  . LOWER EXTREMITY ANGIOGRAM Left 07/26/2013   Procedure: LOWER EXTREMITY ANGIOGRAM;  Surgeon: [name] Matthews, MD;  Location: [facility] CATH LAB;  Service: Cardiovascular;  Laterality: Left;   Family History  Problem Relation Age of Onset  . Cancer Mother 39       breast and bone  . Cancer Father 49       prostate  .  Hypertension Sister   . Bleeding Disorder Sister   . Cancer Cousin 20       breast cancer   . Hypertension Daughter   . Breast cancer Neg Hx     Social History   Tobacco Use  . Smoking status: Former Smoker    Packs/day: 0.50    Years: 50.00    Pack years: 25.00    Types: Cigarettes    Quit date: 02/03/2021    Years since quitting: 0.2  . Smokeless tobacco: Former Systems developer  . Tobacco comment: has an occasional cigarette,   Substance Use Topics  . Alcohol use: No    Alcohol/week: 0.0 standard drinks    Comment: she used to drink alcohol, quit in 2010    Marital Status: Single   ROS  Review of Systems  Constitutional: Negative for malaise/fatigue and weight gain.  Cardiovascular: Positive for chest pain (pleuritic ). Negative for claudication, leg swelling, near-syncope, orthopnea, palpitations, paroxysmal nocturnal dyspnea and syncope.  Respiratory: Positive for shortness of breath.   Hematologic/Lymphatic: Does not bruise/bleed easily.  Musculoskeletal: Positive for joint pain (left shoulder).  Gastrointestinal: Negative for melena.  Neurological: Negative for dizziness and weakness.    Objective  Blood pressure (!) 143/81, pulse 79, temperature 97.9 F (36.6 C), temperature source Oral, height 5\' 1"  (1.549 m), weight 90.7 kg, SpO2 98 %.  Vitals with BMI 05/01/2021 03/15/2021 03/01/2021  Height 5\' 1"  5\' 1"  5\' 1"   Weight 200 lbs 197 lbs 3 oz 200 lbs  BMI 37.81 95.62 13.08  Systolic 657 846 962  Diastolic 81 82 75  Pulse 79 94 107    Physical Exam Vitals reviewed.  Constitutional:      Appearance: She is obese.  HENT:     Head: Normocephalic and atraumatic.  Cardiovascular:     Rate and Rhythm: Regular rhythm. Tachycardia present.     Pulses: Intact distal pulses.     Heart sounds: S1 normal and S2 normal. Murmur heard.   Harsh midsystolic murmur is present at the upper right sternal border. No friction rub. No gallop.      Comments: Tunneled dialysis catheter in place   Pulmonary:     Effort: Pulmonary effort is normal. No respiratory distress.     Breath sounds: No wheezing, rhonchi or rales.  Musculoskeletal:     Right lower leg: No edema.     Left lower leg: No edema.  Skin:    General: Skin is warm and dry.  Neurological:     Mental Status: She is alert.     Laboratory examination:   Recent Labs    05/09/20 1246 05/10/20 0631 05/18/20 1036 10/18/20 0000 10/31/20 0027 11/01/20 0035 11/08/20 0000 02/05/21 1541 03/15/21 1143 04/25/21 1128  NA 136 135 133*   < > 136 135  --  135 139 135  K 4.8 5.1 5.1   < > 5.2* 6.3*   < >  4.3 5.2 4.0  CL 96* 97* 96*   < > 93* 95*  --  93* 94* 93*  CO2 26 25 27    < > 28 25  --  29 26 28   GLUCOSE 71 100* 99   < > 100* 99  --  96 72 139*  BUN 19 35* 38*   < > 39* 61*  --  16 27 13   CREATININE 5.80* 8.44* 6.98*   < > 8.04* 10.69*  --  4.81* 6.27* 4.34*  CALCIUM 9.1 9.1 9.9   < > 8.9 9.1  --  8.7* 10.1 9.3  GFRNONAA 7* 5* 6*   < > 5* 4*  --  9*  --   --   GFRAA 8* 5* 7*  --   --   --   --   --   --   --    < > = values in this interval not displayed.   estimated creatinine clearance is 13.3 mL/min (A) (by C-G formula based on SCr of 4.34 mg/dL (H)).  CMP Latest Ref Rng & Units 04/25/2021 03/15/2021 02/05/2021  Glucose 65 - 99 mg/dL 139(H) 72 96  BUN 8 - 27 mg/dL 13 27 16   Creatinine 0.57 - 1.00 mg/dL 4.34(H) 6.27(H) 4.81(H)  Sodium 134 - 144 mmol/L 135 139 135  Potassium 3.5 - 5.2 mmol/L 4.0 5.2 4.3  Chloride 96 - 106 mmol/L 93(L) 94(L) 93(L)  CO2 20 - 29 mmol/L 28 26 29   Calcium 8.7 - 10.3 mg/dL 9.3 10.1 8.7(L)  Total Protein 6.0 - 8.5 g/dL - 7.4 -  Total Bilirubin 0.0 - 1.2 mg/dL - 0.4 -  Alkaline Phos 44 - 121 IU/L - 110 -  AST 0 - 40 IU/L - 19 -  ALT 0 - 32 IU/L - 19 -   CBC Latest Ref Rng & Units 04/25/2021 03/15/2021 02/05/2021  WBC 3.4 - 10.8 x10E3/uL 11.2(H) 14.1(H) 13.7(H)  Hemoglobin 11.1 - 15.9 g/dL 11.8 11.0(L) 11.7(L)  Hematocrit 34.0 - 46.6 % 34.6 34.0 37.6  Platelets 150 - 450 x10E3/uL  183 212 240    Lipid Panel Recent Labs    03/15/21 1143  CHOL 183  TRIG 159*  LDLCALC 100*  HDL 55  CHOLHDL 3.3    HEMOGLOBIN A1C Lab Results  Component Value Date   HGBA1C 5.1 06/04/2018   MPG 140 09/07/2009   TSH No results for input(s): TSH in the last 8760 hours.  External labs:   06/04/2018: HDL 63, LDL 97, total cholesterol 191, triglycerides 155  Medications and allergies   Allergies  Allergen Reactions  . Carnosine     Other reaction(s): Unknown  . Gadolinium Derivatives Hives and Other (See Comments)    HIVES, Desc: HIVES W/ "DYE" USED FOR 1ST CT SCAN BUT NOT 2ND, NO PREMEDS USED, PT UNCERTAIN OF CIRCUMSTANCES,,?POSSIBLE MRI CONTRAST ALLERGY, ALL STUDIES DONE "SOMEWHERE" IN [place]//A.C., Onset Date: [id number]  . Iohexol Other (See Comments)     Code: HIVES, Desc: HIVES W/ "DYE" USED FOR 1ST CT SCAN BUT NOT 2ND, NO PREMEDS USED, PT UNCERTAIN OF CIRCUMSTANCES,,?POSSIBLE MRI CONTRAST ALLERGY, ALL STUDIES DONE "SOMEWHERE" IN [place]//A.C., Onset Date: [id number]   . Iodine Hives  . Naltrexone     Other reaction(s): Unknown     (Not in an outpatient encounter)    Radiology:   No results found.  Cardiac Studies:   PCV MYOCARDIAL PERFUSION WO LEXISCAN 11/20/2020 Lexiscan not administered at patient's request. Incomplete study. EKG tracing not available for  review. Rest images show mild decrease in tracer update in basal inferior myocardium. Consider alternate ischemia studies, if clinically indicated.  Carotid artery duplex 11/16/2020:  Stenosis in the right internal carotid artery (1-15%).  Stenosis in the left internal carotid artery (50-69%).  Antegrade right vertebral artery flow. Antegrade left vertebral artery flow.  Follow up in six months is appropriate if clinically indicated.  PCV ECHOCARDIOGRAM COMPLETE 11/16/2020 Visually, hyperdynamic LV systolic function with visual EF >70%. Left ventricle cavity is normal in size. Severe left  ventricular hypertrophy.  Normal global wall motion. Unable to evaluate diastolic function due to mitral annular calcification. Elevated LAP.  Consider workup for hypertrophic cardiomyopathy if clinically indicated. Native valve, mitral annular calcification.  Systolic anterior motion of mitral valve.  Mild mitral stenosis.  Mild (Grade I) mitral regurgitation. Mild tricuspid regurgitation. No evidence of pulmonary hypertension. Moderate pulmonic regurgitation. Compared to prior study 03/26/2017: no significant change.  Nuclear stress test Lexiscan Myoview 08/05/2013: Resting EKG NSR, poor R wave progression.  Stress EKG was nondiagnostic for ischemia.  No ST-T changes of ischemia noted with pharmacologic stress testing.  Stress symptoms included shortness of breath and nausea.  Stress terminated due to completion of protocol. The perfusion study demonstrated normal isotope uptake both at rest and stress.  There was no evidence of ischemia or scar.  Dynamic gated images revealed normal wall motion and endocardial thickening.  Left ventricular ejection fraction estimated to be 75%.  No significant change from 01/03/2012.    CT neck 10/31/2011: Normal soft tissues.  Mild pulmonary artery dilation suggest hypertension.  Sleep study 2007: Sleep apnea, noncompliant with CPAP uses supplemental oxygen only.  EKG:   03/01/2021: Sinus rhythm at a rate of 96 bpm with first-degree AV block.  Biatrial enlargement.  Left axis, left anterior fascicular block.  T wave abnormality, cannot exclude anterolateral ischemia.  PR depression, suggestive of pericarditis.  11/14/2020: Sinus rhythm at a rate of 82 bpm with first-degree AV block, biatrial enlargement.  Left axis deviation, left anterior fascicular block.  Poor R wave progression, cannot exclude anteroseptal infarct old.  Cannot exclude inferior ischemia.  Incomplete right bundle branch block.  Compared to EKG 10/30/2020 (external), no significant  change.  12/03/2018: Sinus tachycardia at 113 beats per minute with first-degree AV block.  Normal axis.  PR WP cannot exclude anterior infarct old.  No evidence of ischemia.  Except for tachycardia no changes noted to EKG.  Assessment   1.  Atypical chest pain, however patient has multiple cardiovascular factors are markedly elevated coronary calcium score.  In view of patient unable to perform nuclear stress test, coronary CTA would not be appropriate in view of coronary calcification, she is scheduled for cardiac catheterization.  All questions answered.  Recommendations:   Whitney Walker is a 65 y.o. African-American female with history of obesity, hypertension, hyperlipidemia, tobacco use, stage IV kidney disease on dialysis, obstructive sleep apnea not on CPAP, COPD and severe peripheral arterial disease with history of left femoral-popliteal bypass surgery by Dr. Victorino Dike in 2014.  Patient is on chronic anticoagulation with Eliquis due to recurrent DVT and PE in 2014 and 2016.  Patient is followed by hematology for hypercoagulable state.  She is currently undergoing hemodialysis on a Monday/Wednesday/Friday schedule.  She has no new questions and she is willing to proceed with cardiac catheterization.  She is aware of risks associated with cardiac catheterization including but not limited to less than 1% risk of death stroke, myocardial infarction, bleeding infection  [name], MD, [facility] 05/01/2021, 12:34 PM Office: [phone] Pager: [phone]

## 2021-05-01 NOTE — Progress Notes (Signed)
Patient bedrest is over.  Ambulated to restroom without any difficulties.  No c/o pain.  Groin after ambulation level 0

## 2021-05-01 NOTE — Interval H&P Note (Signed)
History and Physical Interval Note:  05/01/2021 12:35 PM  Whitney Walker  has presented today for surgery, with the diagnosis of chest pain.  The various methods of treatment have been discussed with the patient and family. After consideration of risks, benefits and other options for treatment, the patient has consented to  Procedure(s): LEFT HEART CATH AND CORONARY ANGIOGRAPHY (N/A)  and possible PCI as a surgical intervention.  The patient's history has been reviewed, patient examined, no change in status, stable for surgery.  I have reviewed the patient's chart and labs.  Questions were answered to the patient's satisfaction.    Cath Lab Visit (complete for each Cath Lab visit)  Clinical Evaluation Leading to the Procedure:   ACS: No.  Non-ACS:    Anginal Classification: CCS III  Anti-ischemic medical therapy: Minimal Therapy (1 class of medications)  Non-Invasive Test Results: No non-invasive testing performed  Prior CABG: No previous CABG   [name]

## 2021-05-02 ENCOUNTER — Telehealth: Payer: Self-pay

## 2021-05-02 ENCOUNTER — Other Ambulatory Visit: Payer: Self-pay | Admitting: Student

## 2021-05-02 ENCOUNTER — Encounter (HOSPITAL_COMMUNITY): Payer: Self-pay | Admitting: Cardiology

## 2021-05-02 DIAGNOSIS — N2581 Secondary hyperparathyroidism of renal origin: Secondary | ICD-10-CM | POA: Diagnosis not present

## 2021-05-02 DIAGNOSIS — Z992 Dependence on renal dialysis: Secondary | ICD-10-CM | POA: Diagnosis not present

## 2021-05-02 DIAGNOSIS — R0602 Shortness of breath: Secondary | ICD-10-CM

## 2021-05-02 DIAGNOSIS — E875 Hyperkalemia: Secondary | ICD-10-CM | POA: Diagnosis not present

## 2021-05-02 DIAGNOSIS — N186 End stage renal disease: Secondary | ICD-10-CM | POA: Diagnosis not present

## 2021-05-02 DIAGNOSIS — R072 Precordial pain: Secondary | ICD-10-CM

## 2021-05-02 DIAGNOSIS — D689 Coagulation defect, unspecified: Secondary | ICD-10-CM | POA: Diagnosis not present

## 2021-05-02 NOTE — Telephone Encounter (Signed)
Patient called stating she just had a procedure done yesterday and she needs help with the after care dressing so she would like a nurse to come out to the home to help her.  I returned her call after speaking with Ms.Moore and she advised pt to call Dr.Ganji's office and let them know what is going on. [facility]

## 2021-05-03 ENCOUNTER — Telehealth: Payer: Self-pay

## 2021-05-03 NOTE — Chronic Care Management (AMB) (Signed)
Chronic Care Management Pharmacy Assistant   Name: Whitney Walker  MRN: [id number] DOB: 1956-08-23   Reason for Encounter: Medication Review/Questions   05/03/2021- Missed call from patient, Called patient back, patient needed to call me back, awaiting patient return call. Patient called back with wound care questions. Patient stated she just spoke with nurse from Cardiology from Heart Cath procedure, she had some pain and wound was seeping, patient stated she did a lot of moving around, spring cleaning the day after, she was advised by Cardiology nurse to clean wound and bandage that day and then keep wound clean an open until healed. Patient understand and comfortable with those directions. [name], CPP notified.  Hospital visits:  Medication Reconciliation was completed by comparing discharge summary, patient's EMR and Pharmacy list, and upon discussion with patient.  Admitted to the hospital on 05/01/2021 due to Angina Pectoris/Left Heart Cath and Coronary Angiography. Discharge date was 05/01/2021. Discharged from [facility]?Medications Started at [facility] Discharge:?? -started :None   Medication Changes at Hospital Discharge: -Changed: None  Medications Discontinued at Hospital Discharge: -Stopped: None  Medications that remain the same after Hospital Discharge:??  -All other medications will remain the same.    Medications: Outpatient Encounter Medications as of 05/03/2021  Medication Sig Note   acetaminophen (TYLENOL) 500 MG tablet Take 1,000 mg by mouth 2 (two) times daily as needed for moderate pain or headache.    albuterol (VENTOLIN HFA) 108 (90 Base) MCG/ACT inhaler Inhale 2 puffs into the lungs every 6 (six) hours as needed for wheezing or shortness of breath.    Ascorbic Acid (VITAMIN C) 100 MG tablet Take 100 mg by mouth daily.    Biotin 10000 MCG TABS Take 10,000 mcg by mouth daily.     carvedilol (COREG) 12.5 MG tablet Take 12.5  mg by mouth 2 (two) times daily with a meal.     lanthanum (FOSRENOL) 1000 MG chewable tablet Chew 1,000 mg by mouth See admin instructions. Take 1000 mg with each meal and each snack 05/28/2021: Stopped last week 05-21-21   meclizine (ANTIVERT) 12.5 MG tablet Take 1 tablet (12.5 mg total) by mouth 3 (three) times daily as needed for dizziness.    multivitamin (RENA-VIT) TABS tablet Take 1 tablet by mouth at bedtime.    mupirocin ointment (BACTROBAN) 2 % Apply 1 application topically daily as needed (skin bumps).    nystatin (NYSTATIN) powder Apply 1 application topically 4 (four) times daily as needed. (Patient taking differently: Apply 1 application topically 4 (four) times daily as needed (irriation).)    vitamin E 200 UNIT capsule Take 200 Units by mouth daily.    [DISCONTINUED] amLODipine (NORVASC) 5 MG tablet Take 1 tablet (5 mg total) by mouth daily.    [DISCONTINUED] apixaban (ELIQUIS) 2.5 MG TABS tablet Take 1 tablet (2.5 mg total) by mouth 2 (two) times daily.    [DISCONTINUED] atorvastatin (LIPITOR) 10 MG tablet Take 1 tablet (10 mg total) by mouth daily.    [DISCONTINUED] doxercalciferol (HECTOROL) 4 MCG/2ML injection Doxercalciferol (Hectorol)    [DISCONTINUED] oxyCODONE-acetaminophen (PERCOCET) 5-325 MG tablet Take 2 tablets by mouth every 8 (eight) hours as needed for severe pain. (Patient not taking: Reported on 05/28/2021)    [DISCONTINUED] pantoprazole (PROTONIX) 40 MG tablet Take 1 tablet (40 mg total) by mouth daily.    [DISCONTINUED] Vitamin D, Ergocalciferol, (DRISDOL) 1.25 MG (50000 UNIT) CAPS capsule Take 1 capsule (50,000 Units total) by mouth every 7 (seven)  days. (Patient not taking: Reported on 05/28/2021)    No facility-administered encounter medications on file as of 05/03/2021.   05/14/2021- Returning patient call from missed call on 05/11/2021. Patient wanted to let me know that she spoke with Lilyan Punt, Dietician and they are stopping the IV vitamin D and Dr [name] is taking  her off Vitamin D 50,0000 units tablets she was to take every 7 days. Her recent calcium levels were high. Patient stated from last Carotid Ultrasound they said there was no blood flow on the right side and wanted to recheck the ultrasound but patient declined due to the bruises they left on the left side of her neck. Patient stated it was very painful and she told the technician of the pain so she believes the test was not resulted properly. Patient also mentioned that she received a call from [facility] asking is she was having any pain from the binders she takes Lanthanum (FOSRENOL) 1000 MG chewable tablet. Patient reported not having any pain from medication.  [name], CPP notified.   Star Rating Drugs: Atorvastatin 20 mg- Last filled 01/17/2021 for 53 day supply at [facility].   SIG: Pattricia Walker, [place] Pharmacist Assistant [phone]

## 2021-05-04 DIAGNOSIS — D689 Coagulation defect, unspecified: Secondary | ICD-10-CM | POA: Diagnosis not present

## 2021-05-04 DIAGNOSIS — N186 End stage renal disease: Secondary | ICD-10-CM | POA: Diagnosis not present

## 2021-05-04 DIAGNOSIS — N2581 Secondary hyperparathyroidism of renal origin: Secondary | ICD-10-CM | POA: Diagnosis not present

## 2021-05-04 DIAGNOSIS — Z992 Dependence on renal dialysis: Secondary | ICD-10-CM | POA: Diagnosis not present

## 2021-05-04 DIAGNOSIS — E875 Hyperkalemia: Secondary | ICD-10-CM | POA: Diagnosis not present

## 2021-05-07 DIAGNOSIS — N186 End stage renal disease: Secondary | ICD-10-CM | POA: Diagnosis not present

## 2021-05-07 DIAGNOSIS — Z992 Dependence on renal dialysis: Secondary | ICD-10-CM | POA: Diagnosis not present

## 2021-05-07 DIAGNOSIS — D689 Coagulation defect, unspecified: Secondary | ICD-10-CM | POA: Diagnosis not present

## 2021-05-07 DIAGNOSIS — N2581 Secondary hyperparathyroidism of renal origin: Secondary | ICD-10-CM | POA: Diagnosis not present

## 2021-05-07 DIAGNOSIS — E875 Hyperkalemia: Secondary | ICD-10-CM | POA: Diagnosis not present

## 2021-05-07 NOTE — Progress Notes (Signed)
Primary Physician/Referring:  [name], FNP  Patient ID: Whitney Walker, female    DOB: [birth year], 65 y.o.   MRN: [id number]  Chief Complaint  Patient presents with  . Chest Pain  . Follow-up  . Hospitalization Follow-up   HPI:    Whitney Walker  is a 65 y.o. African-American female with history of obesity, hypertension, hyperlipidemia, tobacco use, stage IV kidney disease on dialysis, obstructive sleep apnea not on CPAP, COPD and severe peripheral arterial disease with history of left femoral-popliteal bypass surgery by Dr. Victorino Dike in 2014.  Patient is on chronic anticoagulation with Eliquis due to recurrent DVT and PE in 2014 and 2016.  Patient is followed by hematology for hypercoagulable state.  She is currently undergoing hemodialysis on a Monday/Wednesday/Friday schedule.  Patient presents for follow-up after left heart catheterization coronary angiography on 05/01/2021. Coronary angiography revealed moderate disease in RCA and circumflex as well as minimal disease in LAD, and recommended evaluation for noncardiac cause of chest pain.  Patient has had no recurrence of chest pain since last visit.  He also finished course of ibuprofen and Protonix for treatment of pericarditis.  She is scheduled to see pulmonology as well as oncology later this month.  Overall patient is frustrated with frequent visits to providers offices, but otherwise is feeling well.  Denies dyspnea, palpitations, syncope, near syncope, dizziness, leg swelling, orthopnea, PND.  She remains noncompliant with CPAP.  Past Medical History:  Diagnosis Date  . Anemia   . Anxiety   . CHF (congestive heart failure) ([place])   . Colon cancer [facility])    treatment surgery  . Complication of anesthesia    after first C- [place] "couldnt walk after", patient denies having a spinal  . COPD (chronic obstructive pulmonary disease) ([place])   . Coronary artery disease   . Depression   . DVT (deep venous thrombosis)  ([place])   . ESRD (end stage renal disease) ([place])    Hemo: MWF  . History of blood transfusion 04/2018  . Hypertension    07/07/18- no longer takes BP medications  . Meningitis   . Pain in limb 07/30/2013  . PE (pulmonary embolism)   . Peripheral vascular disease ([place])   . Restless legs   . Shortness of breath    with exertion  . Sleep apnea   . SOB (shortness of breath) 03/03/2019  . Vertigo    Past Surgical History:  Procedure Laterality Date  . ABDOMINAL AORTAGRAM N/A 07/26/2013   Procedure: ABDOMINAL Maxcine Ham;  Surgeon: [name] [place], MD;  Location: [facility] CATH LAB;  Service: Cardiovascular;  Laterality: N/A;  . ABDOMINAL HYSTERECTOMY    . AV FISTULA PLACEMENT Left 05/05/2018   Procedure: ARTERIOVENOUS (AV) FISTULA CREATION BRACHIOCEPHALIC;  Surgeon: Waynetta Sandy, MD;  Location: [place];  Service: Vascular;  Laterality: Left;  . AV FISTULA PLACEMENT Left 07/16/2018   Procedure: ARTERIOVENOUS FISTULA CREATION;  Surgeon: Waynetta Sandy, MD;  Location: [place];  Service: Vascular;  Laterality: Left;  . BASCILIC VEIN TRANSPOSITION Left 09/03/2018   Procedure: BASILIC VEIN TRANSPOSITION SECOND STAGE;  Surgeon: Waynetta Sandy, MD;  Location: [place];  Service: Vascular;  Laterality: Left;  . BREAST BIOPSY Left   . CESAREAN SECTION     X 3 [id number]  . CHOLECYSTECTOMY    . CHOLECYSTECTOMY  1980/s  . COLECTOMY  2010  . DIVERTICULOSIS SURGERY-2002  2012  . FEMORAL-POPLITEAL BYPASS GRAFT Left 08/13/2013   Procedure: BYPASS GRAFT FEMORAL-POPLITEAL ARTERY WITH  NON-REVERSED SAPHANEOUS VEIN; ULTRASOUND GUIDED;  Surgeon: Mal [name], MD;  Location: [place];  Service: Vascular;  Laterality: Left;  . INSERTION OF DIALYSIS CATHETER Right 04/30/2018   Procedure: INSERTION OF Right Internal Jugular DIALYSIS CATHETER;  Surgeon: [name], MD;  Location: [place];  Service: Vascular;  Laterality: Right;  . IR FLUORO GUIDE CV LINE RIGHT  07/24/2020  . IR REMOVAL TUN CV CATH W/O FL   07/21/2020  . IR US GUIDE VASC ACCESS RIGHT  07/24/2020  . LEFT HEART CATH AND CORONARY ANGIOGRAPHY N/A 05/01/2021   Procedure: LEFT HEART CATH AND CORONARY ANGIOGRAPHY;  Surgeon: [name], MD;  Location: [place] CV LAB;  Service: Cardiovascular;  Laterality: N/A;  . LOWER EXTREMITY ANGIOGRAM Left 07/26/2013   Procedure: LOWER EXTREMITY ANGIOGRAM;  Surgeon: [name] [place], MD;  Location: [facility] CATH LAB;  Service: Cardiovascular;  Laterality: Left;   Family History  Problem Relation Age of Onset  . Cancer Mother 5       breast and bone  . Cancer Father 4       prostate  . Hypertension Sister   . Bleeding Disorder Sister   . Cancer Cousin 20       breast cancer   . Hypertension Daughter   . Breast cancer Neg Hx     Social History   Tobacco Use  . Smoking status: Former Smoker    Packs/day: 0.50    Years: 50.00    Pack years: 25.00    Types: Cigarettes    Quit date: 02/03/2021    Years since quitting: 0.2  . Smokeless tobacco: Former Systems developer  . Tobacco comment: has an occasional cigarette,   Substance Use Topics  . Alcohol use: No    Alcohol/week: 0.0 standard drinks    Comment: she used to drink alcohol, quit in 2010    Marital Status: Single   ROS  Review of Systems  Constitutional: Negative for malaise/fatigue and weight gain.  Cardiovascular: Negative for chest pain, claudication, leg swelling, near-syncope, orthopnea, palpitations, paroxysmal nocturnal dyspnea and syncope.  Respiratory: Negative for shortness of breath.   Hematologic/Lymphatic: Does not bruise/bleed easily.  Musculoskeletal: Negative for joint pain (left shoulder - improved).  Gastrointestinal: Negative for melena.  Neurological: Negative for dizziness and weakness.    Objective  Blood pressure 121/79, pulse 83, temperature 97.7 F (36.5 C), height 5\' 1"  (1.549 m), weight 199 lb (90.3 kg), SpO2 98 %.  Vitals with BMI 05/08/2021 05/01/2021 05/01/2021  Height 5\' 1"  - -  Weight 199 lbs - -  BMI 73.41 - -   Systolic 937 902 409  Diastolic 79 90 94  Pulse 83 - 86    Physical Exam Vitals reviewed.  Constitutional:      Appearance: She is obese.  HENT:     Head: Normocephalic and atraumatic.  Cardiovascular:     Rate and Rhythm: Normal rate and regular rhythm.     Pulses: Intact distal pulses.     Heart sounds: S1 normal and S2 normal. Murmur heard.   Harsh midsystolic murmur is present at the upper right sternal border. No friction rub. No gallop.      Comments: Tunneled dialysis catheter in place  Pulmonary:     Effort: Pulmonary effort is normal. No respiratory distress.     Breath sounds: No wheezing, rhonchi or rales.  Musculoskeletal:     Right lower leg: No edema.     Left lower leg: No edema.  Skin:  General: Skin is warm and dry.     Comments: Ecchymosis noted at IV access point in antecubital fossa on left arm. Right femoral access well-healed without significant ecchymosis, hematoma, or bruit.  Neurological:     Mental Status: She is alert.     Laboratory examination:   Recent Labs    05/09/20 1246 05/10/20 0631 05/18/20 1036 10/18/20 0000 10/31/20 0027 11/01/20 0035 11/08/20 0000 02/05/21 1541 03/15/21 1143 04/25/21 1128  NA 136 135 133*   < > 136 135  --  135 139 135  K 4.8 5.1 5.1   < > 5.2* 6.3*   < > 4.3 5.2 4.0  CL 96* 97* 96*   < > 93* 95*  --  93* 94* 93*  CO2 26 25 27    < > 28 25  --  29 26 28   GLUCOSE 71 100* 99   < > 100* 99  --  96 72 139*  BUN 19 35* 38*   < > 39* 61*  --  16 27 13   CREATININE 5.80* 8.44* 6.98*   < > 8.04* 10.69*  --  4.81* 6.27* 4.34*  CALCIUM 9.1 9.1 9.9   < > 8.9 9.1  --  8.7* 10.1 9.3  GFRNONAA 7* 5* 6*   < > 5* 4*  --  9*  --   --   GFRAA 8* 5* 7*  --   --   --   --   --   --   --    < > = values in this interval not displayed.   estimated creatinine clearance is 13.2 mL/min (A) (by C-G formula based on SCr of 4.34 mg/dL (H)).  CMP Latest Ref Rng & Units 04/25/2021 03/15/2021 02/05/2021  Glucose 65 - 99 mg/dL 139(H)  72 96  BUN 8 - 27 mg/dL 13 27 16   Creatinine 0.57 - 1.00 mg/dL 4.34(H) 6.27(H) 4.81(H)  Sodium 134 - 144 mmol/L 135 139 135  Potassium 3.5 - 5.2 mmol/L 4.0 5.2 4.3  Chloride 96 - 106 mmol/L 93(L) 94(L) 93(L)  CO2 20 - 29 mmol/L 28 26 29   Calcium 8.7 - 10.3 mg/dL 9.3 10.1 8.7(L)  Total Protein 6.0 - 8.5 g/dL - 7.4 -  Total Bilirubin 0.0 - 1.2 mg/dL - 0.4 -  Alkaline Phos 44 - 121 IU/L - 110 -  AST 0 - 40 IU/L - 19 -  ALT 0 - 32 IU/L - 19 -   CBC Latest Ref Rng & Units 04/25/2021 03/15/2021 02/05/2021  WBC 3.4 - 10.8 x10E3/uL 11.2(H) 14.1(H) 13.7(H)  Hemoglobin 11.1 - 15.9 g/dL 11.8 11.0(L) 11.7(L)  Hematocrit 34.0 - 46.6 % 34.6 34.0 37.6  Platelets 150 - 450 x10E3/uL 183 212 240    Lipid Panel Recent Labs    03/15/21 1143  CHOL 183  TRIG 159*  LDLCALC 100*  HDL 55  CHOLHDL 3.3    HEMOGLOBIN A1C Lab Results  Component Value Date   HGBA1C 5.1 06/04/2018   MPG 140 09/07/2009   TSH No results for input(s): TSH in the last 8760 hours.  External labs:   06/04/2018: HDL 63, LDL 97, total cholesterol 191, triglycerides 155  Medications and allergies   Allergies  Allergen Reactions  . Carnosine     Other reaction(s): Unknown  . Gadolinium Derivatives Hives and Other (See Comments)    HIVES, Desc: HIVES W/ "DYE" USED FOR 1ST CT SCAN BUT NOT 2ND, NO PREMEDS USED, PT UNCERTAIN OF CIRCUMSTANCES,,?POSSIBLE MRI CONTRAST ALLERGY,  ALL STUDIES DONE "SOMEWHERE" IN [place]//A.C., Onset Date: [id number]  . Iohexol Other (See Comments)     Code: HIVES, Desc: HIVES W/ "DYE" USED FOR 1ST CT SCAN BUT NOT 2ND, NO PREMEDS USED, PT UNCERTAIN OF CIRCUMSTANCES,,?POSSIBLE MRI CONTRAST ALLERGY, ALL STUDIES DONE "SOMEWHERE" IN [place]//A.C., Onset Date: [id number]   . Iodine Hives  . Naltrexone     Other reaction(s): Unknown     Outpatient Medications Prior to Visit  Medication Sig Dispense Refill  . acetaminophen (TYLENOL) 500 MG tablet Take 1,000 mg by mouth 2 (two) times daily as  needed for moderate pain or headache.    . albuterol (VENTOLIN HFA) 108 (90 Base) MCG/ACT inhaler Inhale 2 puffs into the lungs every 6 (six) hours as needed for wheezing or shortness of breath. 18 g 0  . amLODipine (NORVASC) 5 MG tablet Take 1 tablet (5 mg total) by mouth daily. 90 tablet 1  . apixaban (ELIQUIS) 2.5 MG TABS tablet Take 1 tablet (2.5 mg total) by mouth 2 (two) times daily. 180 tablet 3  . atorvastatin (LIPITOR) 10 MG tablet Take 1 tablet (10 mg total) by mouth daily. 90 tablet 3  . Biotin 10000 MCG TABS Take 10,000 mcg by mouth daily.     . carvedilol (COREG) 12.5 MG tablet Take 12.5 mg by mouth 2 (two) times daily with a meal.     . doxercalciferol (HECTOROL) 4 MCG/2ML injection Doxercalciferol (Hectorol)    . lanthanum (FOSRENOL) 1000 MG chewable tablet Chew 1,000 mg by mouth See admin instructions. Take 1000 mg with each meal and each snack    . meclizine (ANTIVERT) 12.5 MG tablet Take 1 tablet (12.5 mg total) by mouth 3 (three) times daily as needed for dizziness. 30 tablet 0  . multivitamin (RENA-VIT) TABS tablet Take 1 tablet by mouth at bedtime.    . mupirocin ointment (BACTROBAN) 2 % Apply 1 application topically daily as needed (skin bumps). 22 g 2  . nystatin (NYSTATIN) powder Apply 1 application topically 4 (four) times daily as needed. (Patient taking differently: Apply 1 application topically 4 (four) times daily as needed (irriation).) 30 g 1  . oxyCODONE-acetaminophen (PERCOCET) 5-325 MG tablet Take 2 tablets by mouth every 8 (eight) hours as needed for severe pain. 15 tablet 0  . Vitamin D, Ergocalciferol, (DRISDOL) 1.25 MG (50000 UNIT) CAPS capsule Take 1 capsule (50,000 Units total) by mouth every 7 (seven) days. 12 capsule 1  . vitamin E 200 UNIT capsule Take 200 Units by mouth daily.    . pantoprazole (PROTONIX) 40 MG tablet Take 1 tablet (40 mg total) by mouth daily. 30 tablet 0  . Ascorbic Acid (VITAMIN C) 100 MG tablet Take 100 mg by mouth daily.     No  facility-administered medications prior to visit.     Radiology:   No results found.  Cardiac Studies:   Left heart catheterization 05/01/2021: Moderate disease in the right coronary artery and circumflex coronary artery with diffuse coronary calcification, minimal disease in the LAD.  Normal LV systolic function and normal LVEDP.  No pressure gradient across aortic valve.  Recommendation: Evaluation for noncardiac causes of chest pain is indicated.  30 - 40 mL contrast utilized.  Right femoral arterial access closed with minx. Anticoagulation with Eliquis for hypercoagulable state.  No aspirin although she has moderate coronary disease in view of increased risk of bleeding.  PCV MYOCARDIAL PERFUSION WO LEXISCAN 11/20/2020 Lexiscan not administered at patient's request. Incomplete study. EKG tracing not available for review.  Rest images show mild decrease in tracer update in basal inferior myocardium. Consider alternate ischemia studies, if clinically indicated.  Carotid artery duplex 11/16/2020:  Stenosis in the right internal carotid artery (1-15%).  Stenosis in the left internal carotid artery (50-69%).  Antegrade right vertebral artery flow. Antegrade left vertebral artery flow.  Follow up in six months is appropriate if clinically indicated.  PCV ECHOCARDIOGRAM COMPLETE 11/16/2020 Visually, hyperdynamic LV systolic function with visual EF >70%. Left ventricle cavity is normal in size. Severe left ventricular hypertrophy.  Normal global wall motion. Unable to evaluate diastolic function due to mitral annular calcification. Elevated LAP.  Consider workup for hypertrophic cardiomyopathy if clinically indicated. Native valve, mitral annular calcification.  Systolic anterior motion of mitral valve.  Mild mitral stenosis.  Mild (Grade I) mitral regurgitation. Mild tricuspid regurgitation. No evidence of pulmonary hypertension. Moderate pulmonic regurgitation. Compared to prior study  03/26/2017: no significant change.  Nuclear stress test Lexiscan Myoview 08/05/2013: Resting EKG NSR, poor R wave progression.  Stress EKG was nondiagnostic for ischemia.  No ST-T changes of ischemia noted with pharmacologic stress testing.  Stress symptoms included shortness of breath and nausea.  Stress terminated due to completion of protocol. The perfusion study demonstrated normal isotope uptake both at rest and stress.  There was no evidence of ischemia or scar.  Dynamic gated images revealed normal wall motion and endocardial thickening.  Left ventricular ejection fraction estimated to be 75%.  No significant change from 01/03/2012.    CT neck 10/31/2011: Normal soft tissues.  Mild pulmonary artery dilation suggest hypertension.  Sleep study 2007: Sleep apnea, noncompliant with CPAP uses supplemental oxygen only.  EKG:   03/01/2021: Sinus rhythm at a rate of 96 bpm with first-degree AV block.  Biatrial enlargement.  Left axis, left anterior fascicular block.  T wave abnormality, cannot exclude anterolateral ischemia.  PR depression, suggestive of pericarditis.  11/14/2020: Sinus rhythm at a rate of 82 bpm with first-degree AV block, biatrial enlargement.  Left axis deviation, left anterior fascicular block.  Poor R wave progression, cannot exclude anteroseptal infarct old.  Cannot exclude inferior ischemia.  Incomplete right bundle branch block.  Compared to EKG 10/30/2020 (external), no significant change.  12/03/2018: Sinus tachycardia at 113 beats per minute with first-degree AV block.  Normal axis.  PR WP cannot exclude anterior infarct old.  No evidence of ischemia.  Except for tachycardia no changes noted to EKG.  Assessment     ICD-10-CM   1. Coronary artery disease involving native coronary artery of native heart without angina pectoris  I25.10 Lipid Panel With LDL/HDL Ratio    Lipid Panel With LDL/HDL Ratio  2. Chronic diastolic heart failure (HCC)  I50.32   3. Essential hypertension   I10   4. Hypercholesterolemia  E78.00      Medications Discontinued During This Encounter  Medication Reason  . pantoprazole (PROTONIX) 40 MG tablet Error    No orders of the defined types were placed in this encounter.   Recommendations:   Whitney Walker is a 65 y.o. AAfrican-American female with history of obesity, hypertension, hyperlipidemia, tobacco use, stage IV kidney disease on dialysis, obstructive sleep apnea not on CPAP, COPD and severe peripheral arterial disease with history of left femoral-popliteal bypass surgery by Dr. Victorino Dike in 2014.  Patient is on chronic anticoagulation with Eliquis due to recurrent DVT and PE in 2014 and 2016.  Patient is followed by hematology for hypercoagulable state.  She is currently undergoing hemodialysis on a Monday/Wednesday/Friday  schedule.  Patient presents for follow-up after left heart catheterization coronary angiography on 05/01/2021. Coronary angiography revealed moderate disease in RCA and circumflex as well as minimal disease in LAD, and recommended evaluation for noncardiac cause of chest pain.  Patient has had no recurrence of chest pain since last visit.  She continues to tolerate anticoagulation with Eliquis for hypercoagulable state, therefore despite known CAD do not recommend patient taking aspirin.  We will continue other guideline directed medical therapy including atorvastatin and carvedilol.  Patient is notably not on ACE inhibitor or ARB due to renal disease.  Patient underwent coronary calcium score which was >4000, therefore recommended nuclear stress test which patient was unable to complete and therefore underwent left heart catheterization.  Blood pressure is well controlled.  Notably patient's LDL on last check was 100, goal would be closer to LDL of 70.  She has had difficulty tolerating statin therapy in the past, however she appears to be tolerating atorvastatin 10 mg without issue therefore we will continue this.  We  will repeat lipid profile testing at upcoming visit with PCP.  Could consider addition of Zetia in the future for further management of hyperlipidemia.  Patient would also be a good candidate for Repatha or Praluent, however she is very resistant to the idea of injectable medications.  Patient is relatively stable from a cardiovascular standpoint.  Follow-up in 4 months, sooner if needed, for CAD and hyperlipidemia after wellness visit with PCP.   Alethia Berthold, PA-C 05/08/2021, 1:07 PM Office: [phone]

## 2021-05-08 ENCOUNTER — Other Ambulatory Visit: Payer: Self-pay

## 2021-05-08 ENCOUNTER — Ambulatory Visit: Payer: Medicare Other | Admitting: Student

## 2021-05-08 ENCOUNTER — Ambulatory Visit: Payer: Medicare Other

## 2021-05-08 ENCOUNTER — Encounter: Payer: Self-pay | Admitting: Student

## 2021-05-08 VITALS — BP 121/79 | HR 83 | Temp 97.7°F | Ht 61.0 in | Wt 199.0 lb

## 2021-05-08 DIAGNOSIS — E7800 Pure hypercholesterolemia, unspecified: Secondary | ICD-10-CM

## 2021-05-08 DIAGNOSIS — I6522 Occlusion and stenosis of left carotid artery: Secondary | ICD-10-CM

## 2021-05-08 DIAGNOSIS — I6523 Occlusion and stenosis of bilateral carotid arteries: Secondary | ICD-10-CM

## 2021-05-08 DIAGNOSIS — I5032 Chronic diastolic (congestive) heart failure: Secondary | ICD-10-CM | POA: Diagnosis not present

## 2021-05-08 DIAGNOSIS — I2510 Atherosclerotic heart disease of native coronary artery without angina pectoris: Secondary | ICD-10-CM

## 2021-05-08 DIAGNOSIS — I10 Essential (primary) hypertension: Secondary | ICD-10-CM | POA: Diagnosis not present

## 2021-05-09 ENCOUNTER — Other Ambulatory Visit: Payer: Medicare Other

## 2021-05-09 DIAGNOSIS — D689 Coagulation defect, unspecified: Secondary | ICD-10-CM | POA: Diagnosis not present

## 2021-05-09 DIAGNOSIS — Z992 Dependence on renal dialysis: Secondary | ICD-10-CM | POA: Diagnosis not present

## 2021-05-09 DIAGNOSIS — N186 End stage renal disease: Secondary | ICD-10-CM | POA: Diagnosis not present

## 2021-05-09 DIAGNOSIS — E875 Hyperkalemia: Secondary | ICD-10-CM | POA: Diagnosis not present

## 2021-05-09 DIAGNOSIS — N2581 Secondary hyperparathyroidism of renal origin: Secondary | ICD-10-CM | POA: Diagnosis not present

## 2021-05-11 DIAGNOSIS — N186 End stage renal disease: Secondary | ICD-10-CM | POA: Diagnosis not present

## 2021-05-11 DIAGNOSIS — Z992 Dependence on renal dialysis: Secondary | ICD-10-CM | POA: Diagnosis not present

## 2021-05-11 DIAGNOSIS — E875 Hyperkalemia: Secondary | ICD-10-CM | POA: Diagnosis not present

## 2021-05-11 DIAGNOSIS — N2581 Secondary hyperparathyroidism of renal origin: Secondary | ICD-10-CM | POA: Diagnosis not present

## 2021-05-11 DIAGNOSIS — D689 Coagulation defect, unspecified: Secondary | ICD-10-CM | POA: Diagnosis not present

## 2021-05-14 DIAGNOSIS — Z992 Dependence on renal dialysis: Secondary | ICD-10-CM | POA: Diagnosis not present

## 2021-05-14 DIAGNOSIS — N186 End stage renal disease: Secondary | ICD-10-CM | POA: Diagnosis not present

## 2021-05-14 DIAGNOSIS — D689 Coagulation defect, unspecified: Secondary | ICD-10-CM | POA: Diagnosis not present

## 2021-05-14 DIAGNOSIS — N2581 Secondary hyperparathyroidism of renal origin: Secondary | ICD-10-CM | POA: Diagnosis not present

## 2021-05-16 DIAGNOSIS — D689 Coagulation defect, unspecified: Secondary | ICD-10-CM | POA: Diagnosis not present

## 2021-05-16 DIAGNOSIS — N186 End stage renal disease: Secondary | ICD-10-CM | POA: Diagnosis not present

## 2021-05-16 DIAGNOSIS — N2581 Secondary hyperparathyroidism of renal origin: Secondary | ICD-10-CM | POA: Diagnosis not present

## 2021-05-16 DIAGNOSIS — Z992 Dependence on renal dialysis: Secondary | ICD-10-CM | POA: Diagnosis not present

## 2021-05-17 ENCOUNTER — Other Ambulatory Visit: Payer: Self-pay

## 2021-05-17 ENCOUNTER — Inpatient Hospital Stay (HOSPITAL_BASED_OUTPATIENT_CLINIC_OR_DEPARTMENT_OTHER): Payer: Medicare Other | Admitting: Hematology

## 2021-05-17 ENCOUNTER — Inpatient Hospital Stay: Payer: Medicare Other | Attending: Hematology

## 2021-05-17 ENCOUNTER — Encounter: Payer: Self-pay | Admitting: Hematology

## 2021-05-17 VITALS — BP 114/78 | HR 93 | Temp 97.7°F | Resp 17 | Ht 61.0 in | Wt 201.2 lb

## 2021-05-17 DIAGNOSIS — N186 End stage renal disease: Secondary | ICD-10-CM | POA: Diagnosis not present

## 2021-05-17 DIAGNOSIS — Z1231 Encounter for screening mammogram for malignant neoplasm of breast: Secondary | ICD-10-CM | POA: Diagnosis not present

## 2021-05-17 DIAGNOSIS — Z86718 Personal history of other venous thrombosis and embolism: Secondary | ICD-10-CM | POA: Diagnosis not present

## 2021-05-17 DIAGNOSIS — I82412 Acute embolism and thrombosis of left femoral vein: Secondary | ICD-10-CM

## 2021-05-17 DIAGNOSIS — N183 Chronic kidney disease, stage 3 unspecified: Secondary | ICD-10-CM

## 2021-05-17 DIAGNOSIS — D631 Anemia in chronic kidney disease: Secondary | ICD-10-CM

## 2021-05-17 DIAGNOSIS — Z7901 Long term (current) use of anticoagulants: Secondary | ICD-10-CM | POA: Insufficient documentation

## 2021-05-17 DIAGNOSIS — Z992 Dependence on renal dialysis: Secondary | ICD-10-CM | POA: Diagnosis not present

## 2021-05-17 LAB — CBC WITH DIFFERENTIAL/PLATELET
Abs Immature Granulocytes: 0.11 10*3/uL — ABNORMAL HIGH (ref 0.00–0.07)
Basophils Absolute: 0.1 10*3/uL (ref 0.0–0.1)
Basophils Relative: 0 %
Eosinophils Absolute: 0.6 10*3/uL — ABNORMAL HIGH (ref 0.0–0.5)
Eosinophils Relative: 6 %
HCT: 35.5 % — ABNORMAL LOW (ref 36.0–46.0)
Hemoglobin: 11.6 g/dL — ABNORMAL LOW (ref 12.0–15.0)
Immature Granulocytes: 1 %
Lymphocytes Relative: 19 %
Lymphs Abs: 2.2 10*3/uL (ref 0.7–4.0)
MCH: 29.4 pg (ref 26.0–34.0)
MCHC: 32.7 g/dL (ref 30.0–36.0)
MCV: 90.1 fL (ref 80.0–100.0)
Monocytes Absolute: 0.9 10*3/uL (ref 0.1–1.0)
Monocytes Relative: 8 %
Neutro Abs: 7.5 10*3/uL (ref 1.7–7.7)
Neutrophils Relative %: 66 %
Platelets: 162 10*3/uL (ref 150–400)
RBC: 3.94 MIL/uL (ref 3.87–5.11)
RDW: 16.3 % — ABNORMAL HIGH (ref 11.5–15.5)
WBC: 11.4 10*3/uL — ABNORMAL HIGH (ref 4.0–10.5)
nRBC: 0 % (ref 0.0–0.2)

## 2021-05-17 LAB — IRON AND TIBC
Iron: 87 ug/dL (ref 41–142)
Saturation Ratios: 41 % (ref 21–57)
TIBC: 213 ug/dL — ABNORMAL LOW (ref 236–444)
UIBC: 125 ug/dL (ref 120–384)

## 2021-05-17 LAB — COMPREHENSIVE METABOLIC PANEL
ALT: 15 U/L (ref 0–44)
AST: 18 U/L (ref 15–41)
Albumin: 2.9 g/dL — ABNORMAL LOW (ref 3.5–5.0)
Alkaline Phosphatase: 112 U/L (ref 38–126)
Anion gap: 13 (ref 5–15)
BUN: 48 mg/dL — ABNORMAL HIGH (ref 8–23)
CO2: 27 mmol/L (ref 22–32)
Calcium: 10 mg/dL (ref 8.9–10.3)
Chloride: 98 mmol/L (ref 98–111)
Creatinine, Ser: 8.6 mg/dL (ref 0.44–1.00)
GFR, Estimated: 5 mL/min — ABNORMAL LOW (ref >60)
Glucose, Bld: 125 mg/dL — ABNORMAL HIGH (ref 70–99)
Potassium: 5.2 mmol/L — ABNORMAL HIGH (ref 3.5–5.1)
Sodium: 138 mmol/L (ref 135–145)
Total Bilirubin: 0.2 mg/dL — ABNORMAL LOW (ref 0.3–1.2)
Total Protein: 7.3 g/dL (ref 6.5–8.1)

## 2021-05-17 LAB — FERRITIN: Ferritin: 2001 ng/mL — ABNORMAL HIGH (ref 11–307)

## 2021-05-17 MED ORDER — OMEPRAZOLE 20 MG PO CPDR: 20.0000 mg | DELAYED_RELEASE_CAPSULE | Freq: Every day | ORAL | 1 refills | Status: DC

## 2021-05-17 NOTE — Progress Notes (Signed)
[place]   [phone]) [phone] [phone]   Clinic Follow up Note   Patient Care Team: [name], [place] as PCP - General ([place]) Waynetta Sandy, MD as Consulting Physician (Vascular Surgery) [facility] as Referring Physician (Nephrology) Edrick Oh, MD as Consulting Physician (Nephrology) Truitt Merle, MD as Consulting Physician (Hematology) [place], RN as [place], Kennieth Francois, [facility] (Inactive) (Pharmacist)  Date of Service:  05/17/2021  CHIEF COMPLAINT: f/u of anemia, history of thrombosis  CURRENT THERAPY:  Eliquis 2.5mg  bid (dose per renal function)  INTERVAL HISTORY:  Whitney Walker is here for a follow up of anemia. She was last seen by me a year ago. She presents to the clinic alone. She notes she is scheduled to meet with the transplant team on 05/22/21. Her vitamin D was very low on 03/15/21. She notes she was placed on vitamin D supplement. However, she was taken off of it for concern of increasing calcium. She reports her body hurts when she goes to get up. She took oxycodone yesterday to manage the pain. She notes her walker isn't helpful because it's too far away. She reports she is catching herself from falling, which she notes is new. She recently presented to the ED because of chest pain. She ultimately underwent heart catheterization on 05/01/21. She has not experienced pain like that since then. She notes she has experienced excessive belching recently. She also notes it can be difficult to swallow things.  All other systems were reviewed with the patient and are negative.  MEDICAL HISTORY:  Past Medical History:  Diagnosis Date  . Anemia   . Anxiety   . CHF (congestive heart failure) ([place])   . Colon cancer [facility])    treatment surgery  . Complication of anesthesia    after first C-  [place] "couldnt walk after", patient denies having a spinal  . COPD (chronic obstructive pulmonary disease) ([place])   . Coronary artery disease   . Depression   . DVT (deep venous thrombosis) ([place])   . ESRD (end stage renal disease) ([place])    Hemo: MWF  . History of blood transfusion 04/2018  . Hypertension    07/07/18- no longer takes BP medications  . Meningitis   . Pain in limb 07/30/2013  . PE (pulmonary embolism)   . Peripheral vascular disease ([place])   . Restless legs   . Shortness of breath    with exertion  . Sleep apnea   . SOB (shortness of breath) 03/03/2019  . Vertigo     SURGICAL HISTORY: Past Surgical History:  Procedure Laterality Date  . ABDOMINAL AORTAGRAM N/A 07/26/2013   Procedure: ABDOMINAL Maxcine Ham;  Surgeon: [name] Chelan, MD;  Location: [facility] CATH LAB;  Service: Cardiovascular;  Laterality: N/A;  . ABDOMINAL HYSTERECTOMY    . AV FISTULA PLACEMENT Left 05/05/2018   Procedure: ARTERIOVENOUS (AV) FISTULA CREATION BRACHIOCEPHALIC;  Surgeon: Waynetta Sandy, MD;  Location: [place];  Service: Vascular;  Laterality: Left;  . AV FISTULA PLACEMENT Left 07/16/2018   Procedure: ARTERIOVENOUS FISTULA CREATION;  Surgeon: Waynetta Sandy, MD;  Location: [place];  Service: Vascular;  Laterality: Left;  . BASCILIC VEIN TRANSPOSITION Left 09/03/2018   Procedure: BASILIC VEIN TRANSPOSITION SECOND STAGE;  Surgeon: Waynetta Sandy, MD;  Location: [place];  Service: Vascular;  Laterality: Left;  . BREAST BIOPSY Left   . CESAREAN SECTION  X 3 [id number]  . CHOLECYSTECTOMY    . CHOLECYSTECTOMY  1980/s  . COLECTOMY  2010  . DIVERTICULOSIS SURGERY-2002  2012  . FEMORAL-POPLITEAL BYPASS GRAFT Left 08/13/2013   Procedure: BYPASS GRAFT FEMORAL-POPLITEAL ARTERY WITH NON-REVERSED SAPHANEOUS VEIN; ULTRASOUND GUIDED;  Surgeon: Mal [name], MD;  Location: [place];  Service: Vascular;  Laterality: Left;  . INSERTION OF DIALYSIS CATHETER Right 04/30/2018   Procedure: INSERTION  OF Right Internal Jugular DIALYSIS CATHETER;  Surgeon: [name], MD;  Location: [place];  Service: Vascular;  Laterality: Right;  . IR FLUORO GUIDE CV LINE RIGHT  07/24/2020  . IR REMOVAL TUN CV CATH W/O FL  07/21/2020  . IR US GUIDE VASC ACCESS RIGHT  07/24/2020  . LEFT HEART CATH AND CORONARY ANGIOGRAPHY N/A 05/01/2021   Procedure: LEFT HEART CATH AND CORONARY ANGIOGRAPHY;  Surgeon: [name], MD;  Location: [place] CV LAB;  Service: Cardiovascular;  Laterality: N/A;  . LOWER EXTREMITY ANGIOGRAM Left 07/26/2013   Procedure: LOWER EXTREMITY ANGIOGRAM;  Surgeon: [name] Ulster, MD;  Location[facility] CATH LAB;  Service: Cardiovascular;  Laterality: Left;    I have reviewed the social history and family history with the patient and they are unchanged from previous note.  ALLERGIES:  is allergic to carnosine, gadolinium derivatives, iohexol, iodine, and naltrexone.  MEDICATIONS:  Current Outpatient Medications  Medication Sig Dispense Refill  . omeprazole (PRILOSEC) 20 MG capsule Take 1 capsule (20 mg total) by mouth daily. 30 capsule 1  . acetaminophen (TYLENOL) 500 MG tablet Take 1,000 mg by mouth 2 (two) times daily as needed for moderate pain or headache.    . albuterol (VENTOLIN HFA) 108 (90 Base) MCG/ACT inhaler Inhale 2 puffs into the lungs every 6 (six) hours as needed for wheezing or shortness of breath. 18 g 0  . amLODipine (NORVASC) 5 MG tablet Take 1 tablet (5 mg total) by mouth daily. 90 tablet 1  . apixaban (ELIQUIS) 2.5 MG TABS tablet Take 1 tablet (2.5 mg total) by mouth 2 (two) times daily. 180 tablet 3  . Ascorbic Acid (VITAMIN C) 100 MG tablet Take 100 mg by mouth daily.    [name] atorvastatin (LIPITOR) 10 MG tablet Take 1 tablet (10 mg total) by mouth daily. 90 tablet 3  . Biotin 10000 MCG TABS Take 10,000 mcg by mouth daily.     . carvedilol (COREG) 12.5 MG tablet Take 12.5 mg by mouth 2 (two) times daily with a meal.     . lanthanum (FOSRENOL) 1000 MG chewable tablet Chew 1,000  mg by mouth See admin instructions. Take 1000 mg with each meal and each snack    . meclizine (ANTIVERT) 12.5 MG tablet Take 1 tablet (12.5 mg total) by mouth 3 (three) times daily as needed for dizziness. 30 tablet 0  . multivitamin (RENA-VIT) TABS tablet Take 1 tablet by mouth at bedtime.    . mupirocin ointment (BACTROBAN) 2 % Apply 1 application topically daily as needed (skin bumps). 22 g 2  . nystatin (NYSTATIN) powder Apply 1 application topically 4 (four) times daily as needed. (Patient taking differently: Apply 1 application topically 4 (four) times daily as needed (irriation).) 30 g 1  . oxyCODONE-acetaminophen (PERCOCET) 5-325 MG tablet Take 2 tablets by mouth every 8 (eight) hours as needed for severe pain. 15 tablet 0  . Vitamin D, Ergocalciferol, (DRISDOL) 1.25 MG (50000 UNIT) CAPS capsule Take 1 capsule (50,000 Units total) by mouth every 7 (seven) days. 12 capsule 1  .  vitamin E 200 UNIT capsule Take 200 Units by mouth daily.     No current facility-administered medications for this visit.    PHYSICAL EXAMINATION: ECOG PERFORMANCE STATUS: 1 - Symptomatic but completely ambulatory  Vitals:   05/17/21 1302  BP: 114/78  Pulse: 93  Resp: 17  Temp: 97.7 F (36.5 C)  SpO2: 98%   Filed Weights   05/17/21 1302  Weight: 201 lb 3.2 oz (91.3 kg)   Due to COVID19 we will limit examination to appearance. Patient had no complaints.  GENERAL:alert, no distress and comfortable SKIN: skin color normal, no rashes or significant lesions EYES: normal, Conjunctiva are pink and non-injected, sclera clear  NEURO: alert & oriented x 3 with fluent speech  LABORATORY DATA:  I have reviewed the data as listed CBC Latest Ref Rng & Units 05/17/2021 04/25/2021 03/15/2021  WBC 4.0 - 10.5 K/uL 11.4(H) 11.2(H) 14.1(H)  Hemoglobin 12.0 - 15.0 g/dL 11.6(L) 11.8 11.0(L)  Hematocrit 36.0 - 46.0 % 35.5(L) 34.6 34.0  Platelets 150 - 400 K/uL 162 183 212     CMP Latest Ref Rng & Units 05/17/2021  04/25/2021 03/15/2021  Glucose 70 - 99 mg/dL 125(H) 139(H) 72  BUN 8 - 23 mg/dL 48(H) 13 27  Creatinine 0.44 - 1.00 mg/dL 8.60(HH) 4.34(H) 6.27(H)  Sodium 135 - 145 mmol/L 138 135 139  Potassium 3.5 - 5.1 mmol/L 5.2(H) 4.0 5.2  Chloride 98 - 111 mmol/L 98 93(L) 94(L)  CO2 22 - 32 mmol/L 27 28 26   Calcium 8.9 - 10.3 mg/dL 10.0 9.3 10.1  Total Protein 6.5 - 8.1 g/dL 7.3 - 7.4  Total Bilirubin 0.3 - 1.2 mg/dL 0.2(L) - 0.4  Alkaline Phos 38 - 126 U/L 112 - 110  AST 15 - 41 U/L 18 - 19  ALT 0 - 44 U/L 15 - 19      RADIOGRAPHIC STUDIES: I have personally reviewed the radiological images as listed and agreed with the findings in the report. No results found.   ASSESSMENT & PLAN:  Whitney Walker is a 64 y.o. female with   1. Recurrent DVT and PE -She previously had episodes of PE, was treated with Xarelto. 10 months after she came off Xarelto, she developed unprovoked DVT and PE. -Ipreviouslyrecommendedher to continue anticoagulation indefinitely, if no contraindications. -Patient was previously on Coumadin, but switched to Eliquis due to her transportation issues with Coumadin check up. She takes Eliquis 2.5mg  BID, dose adjusted based on her ESRD on HD. -Shepreviouslystatedshe has been giving high dose hepatin during dialysis due to clotting issue, which is fine.  -Continue Eliquis 2.5mg  BID and continue to watch for blood clot.  -She would like to continue f/u with me  2. Anemia of chronic disease and anemia of iron deficiency -likely related toherchronic kidney disease  -She takesoral iron supplement and IV iron as needed which is managed by her nephrologist at dialysis center.  -Stable, Hg 11.6 today (05/17/21)  3. History of colon cancer in 2009  -She has completed 5 years of surveillance.  -last coloscopy done by Dr. Collene Mares within the last 5 years according to patient.Before that was in 05/2012. -She notes she is due for her next screening colonoscopy in  2023.  4. CKD, stage V, on HD -Sheunderwentfistula surgery with Dr. Donzetta Matters on 05/05/18 and shealso starteddialysis in 04/2018, currently [facility]. She is able to make small urine output.  -Labs reviewed, creatinine 8.6 today (05/17/21), as to be expected post dialysis yesterday.  5. OSA, PVD, CHF,  acid reflux -She is s/p heart cath 05/01/21 for atypical chest pain, which could be related to her GERD -She reports excessive belching lately (05/17/21). She has tried tums and pepto bismol. I prescribed omeprazole to help with reflux. -F/u with PCPand other specialists. -stable  6. Cancer screening -lung cancer screening CT on 12/12/20 was benign -she is overdue for mammography. I will order this for her today. -she is following up with GI and knows she is due for repeat colonoscopy in 2023.  7. Anxiety and depression -She is being followed by a Dr. Ouida Sills for counseling.Mood is stable.   Plan -Ordered screening mammogram today -I prescribed omeprazole today for her reflux symptoms -Continue Eliquis 2.5mg  BID, refilled today  -Lab and f/u in 1 year    No problem-specific Assessment & Plan notes found for this encounter.   Orders Placed This Encounter  Procedures  . MM Digital Screening    Standing Status:   Future    Standing Expiration Date:   05/17/2022    Order Specific Question:   Reason for Exam (SYMPTOM  OR DIAGNOSIS REQUIRED)    Answer:   screening    Order Specific Question:   Preferred imaging location?    Answer:   [facility]   All questions were answered. The patient knows to call the clinic with any problems, questions or concerns. No barriers to learning was detected. The total time spent in the appointment was 30 minutes.     Truitt Merle, MD 05/17/2021   I, Wilburn Mylar, am acting as scribe for Truitt Merle, MD.   I have reviewed the above documentation for accuracy and completeness, and I agree with the above.

## 2021-05-17 NOTE — Progress Notes (Signed)
Critical Value: Creatinine: 8.6 Pt on hemodialysis Dr Burr Medico notified.

## 2021-05-18 ENCOUNTER — Other Ambulatory Visit: Payer: Medicare Other

## 2021-05-18 ENCOUNTER — Ambulatory Visit: Payer: Medicare Other | Admitting: Hematology

## 2021-05-18 ENCOUNTER — Telehealth: Payer: Self-pay | Admitting: Hematology

## 2021-05-18 ENCOUNTER — Ambulatory Visit: Payer: 59 | Admitting: Student

## 2021-05-18 DIAGNOSIS — Z992 Dependence on renal dialysis: Secondary | ICD-10-CM | POA: Diagnosis not present

## 2021-05-18 DIAGNOSIS — D689 Coagulation defect, unspecified: Secondary | ICD-10-CM | POA: Diagnosis not present

## 2021-05-18 DIAGNOSIS — N2581 Secondary hyperparathyroidism of renal origin: Secondary | ICD-10-CM | POA: Diagnosis not present

## 2021-05-18 DIAGNOSIS — N186 End stage renal disease: Secondary | ICD-10-CM | POA: Diagnosis not present

## 2021-05-18 NOTE — Telephone Encounter (Signed)
Scheduled follow-up appointment per 5/19 los. Patient is aware.

## 2021-05-21 DIAGNOSIS — D689 Coagulation defect, unspecified: Secondary | ICD-10-CM | POA: Diagnosis not present

## 2021-05-21 DIAGNOSIS — N186 End stage renal disease: Secondary | ICD-10-CM | POA: Diagnosis not present

## 2021-05-21 DIAGNOSIS — E875 Hyperkalemia: Secondary | ICD-10-CM | POA: Diagnosis not present

## 2021-05-21 DIAGNOSIS — Z992 Dependence on renal dialysis: Secondary | ICD-10-CM | POA: Diagnosis not present

## 2021-05-21 DIAGNOSIS — N2581 Secondary hyperparathyroidism of renal origin: Secondary | ICD-10-CM | POA: Diagnosis not present

## 2021-05-22 ENCOUNTER — Telehealth: Payer: Medicare Other

## 2021-05-22 NOTE — Chronic Care Management (AMB) (Signed)
Chronic Care Management Pharmacy Assistant   Name: Whitney Walker  MRN: [id number] DOB: 11-06-1956  Reason for Encounter:  Medication Review   04/12/2021- Patient called to mention that she did get a phone call from [name], RN, CCM services and she was able to speak with Dietician-Lois at the Dialysis clinic and was told to take Vitamin D3 Extra Strength, and aware of Prescription of Vitamin D 50,000 units that was sent to her pharmacy. Patient still unsure why her Vitamin D levels are low, she does have some bodyaches and bone aches at times. Patient also mentioned flare ups of Vertigo but have Meclizine on hand that she will use PRN especially if she feel like she will pass out. Patient instructed to take her time getting up from a laying position to sitting position to standings position, she should wait until all dizzy symptoms reside and keep Meclizine close to her around those times.  Patient mentioned she will be going to [facility] for Kidney Transplant testing and classes. She was able to speak with a representative at [facility] to get clarification on tests that will be performed and concerns she has, patient feel more comfortable about procedure. Patient also mentioned a grievance she placed on a nurse at the [place]. She has mentioned lots of complaints she spoke of with management at [facility], rudeness and roughness of nurse, non attentive actions, port being back up, patient almost passing out due to drop of blood pressure with no quick assistance on bring them back up. Patient stated the last straw is when she complained about how she was feeling and was so upset she wanted to leave and never come back and the nurse made a comment per patient "Well then don't come back". She went to upper management who per patient commented that this was not the only or first complaint on this nurse and they are reviewing her grievance and will follow up  with her and their decision for this nurse at the clinic.   04/26/2021- Patient called wondering why she is getting so many phone calls, informed patient that [name], CPP called regarding Vitamin D medication and her assistant Jeannette How, CPA called yesterday to remind of her appointment with [name], CPP and she received a call from CPP before she called me but did not recognize the number, message sent to CPP to retry patient, patient informed that she will get a call and to please answer for her visit with Clinical Pharmacist [name] to review medications and assist with any questions or concerns she may have. Patient aware.   Medications: Outpatient Encounter Medications as of 04/12/2021  Medication Sig   acetaminophen (TYLENOL) 500 MG tablet Take 1,000 mg by mouth 2 (two) times daily as needed for moderate pain or headache.   albuterol (VENTOLIN HFA) 108 (90 Base) MCG/ACT inhaler Inhale 2 puffs into the lungs every 6 (six) hours as needed for wheezing or shortness of breath.   amLODipine (NORVASC) 5 MG tablet Take 1 tablet (5 mg total) by mouth daily.   apixaban (ELIQUIS) 2.5 MG TABS tablet Take 1 tablet (2.5 mg total) by mouth 2 (two) times daily.   Ascorbic Acid (VITAMIN C) 100 MG tablet Take 100 mg by mouth daily.   atorvastatin (LIPITOR) 10 MG tablet Take 1 tablet (10 mg total) by mouth daily.   Biotin 10000 MCG TABS Take 10,000 mcg by mouth daily.    carvedilol (COREG) 12.5  MG tablet Take 12.5 mg by mouth 2 (two) times daily with a meal.    lanthanum (FOSRENOL) 1000 MG chewable tablet Chew 1,000 mg by mouth See admin instructions. Take 1000 mg with each meal and each snack   meclizine (ANTIVERT) 12.5 MG tablet Take 1 tablet (12.5 mg total) by mouth 3 (three) times daily as needed for dizziness.   multivitamin (RENA-VIT) TABS tablet Take 1 tablet by mouth at bedtime.   mupirocin ointment (BACTROBAN) 2 % Apply 1 application topically daily as needed (skin bumps).    nystatin (NYSTATIN) powder Apply 1 application topically 4 (four) times daily as needed. (Patient taking differently: Apply 1 application topically 4 (four) times daily as needed (irriation).)   oxyCODONE-acetaminophen (PERCOCET) 5-325 MG tablet Take 2 tablets by mouth every 8 (eight) hours as needed for severe pain.   Vitamin D, Ergocalciferol, (DRISDOL) 1.25 MG (50000 UNIT) CAPS capsule Take 1 capsule (50,000 Units total) by mouth every 7 (seven) days.   vitamin E 200 UNIT capsule Take 200 Units by mouth daily.   [DISCONTINUED] albuterol (PROVENTIL) (2.5 MG/3ML) 0.083% nebulizer solution Take 3 mLs (2.5 mg total) by nebulization every 4 (four) hours as needed for wheezing or shortness of breath. (Patient not taking: No sig reported)   [DISCONTINUED] amoxicillin (AMOXIL) 500 MG tablet Take 1 tablet (500 mg total) by mouth 2 (two) times daily. (Patient not taking: No sig reported)   [DISCONTINUED] apixaban (ELIQUIS) 2.5 MG TABS tablet Take 1 tablet (2.5 mg total) by mouth 2 (two) times daily.   [DISCONTINUED] doxercalciferol (HECTOROL) 4 MCG/2ML injection Doxercalciferol (Hectorol)   [DISCONTINUED] ibuprofen (ADVIL) 800 MG tablet Take 1 tablet (800 mg total) by mouth 3 (three) times daily.   [DISCONTINUED] pantoprazole (PROTONIX) 40 MG tablet Take 1 tablet (40 mg total) by mouth daily.   [EXPIRED] aspirin chewable tablet 81 mg    [EXPIRED] diphenhydrAMINE (BENADRYL) injection 25 mg    [EXPIRED] famotidine (PEPCID) IVPB 20 mg premix    [EXPIRED] methylPREDNISolone sodium succinate (SOLU-MEDROL) 125 mg/2 mL injection 125 mg    [DISCONTINUED] 0.9 %  sodium chloride infusion    [DISCONTINUED] 0.9 %  sodium chloride infusion    [DISCONTINUED] 0.9 %  sodium chloride infusion    [DISCONTINUED] 0.9% sodium chloride infusion    [DISCONTINUED] 0.9% sodium chloride infusion    [DISCONTINUED] acetaminophen (TYLENOL) tablet 650 mg    [DISCONTINUED] fentaNYL (SUBLIMAZE) injection    [DISCONTINUED] Heparin  (Porcine) in NaCl 1000-0.9 UT/500ML-% SOLN    [DISCONTINUED] hydrALAZINE (APRESOLINE) injection 10 mg    [DISCONTINUED] iohexol (OMNIPAQUE) 350 MG/ML injection    [DISCONTINUED] labetalol (NORMODYNE) injection 10 mg    [DISCONTINUED] lidocaine (PF) (XYLOCAINE) 1 % injection    [DISCONTINUED] midazolam (VERSED) injection    [DISCONTINUED] ondansetron (ZOFRAN) injection 4 mg    [DISCONTINUED] sodium chloride flush (NS) 0.9 % injection 3 mL    [DISCONTINUED] sodium chloride flush (NS) 0.9 % injection 3 mL    [DISCONTINUED] sodium chloride flush (NS) 0.9 % injection 3 mL    [DISCONTINUED] sodium chloride flush (NS) 0.9 % injection 3 mL    No facility-administered encounter medications on file as of 04/12/2021.   Star Rating Drugs: Atorvastatin 20 mg- Last filled 01/17/2021 for 53 day supply at [facility].    SIG: [name], [place] Pharmacist Assistant [phone]

## 2021-05-23 DIAGNOSIS — D689 Coagulation defect, unspecified: Secondary | ICD-10-CM | POA: Diagnosis not present

## 2021-05-23 DIAGNOSIS — N186 End stage renal disease: Secondary | ICD-10-CM | POA: Diagnosis not present

## 2021-05-23 DIAGNOSIS — N2581 Secondary hyperparathyroidism of renal origin: Secondary | ICD-10-CM | POA: Diagnosis not present

## 2021-05-23 DIAGNOSIS — E875 Hyperkalemia: Secondary | ICD-10-CM | POA: Diagnosis not present

## 2021-05-23 DIAGNOSIS — Z992 Dependence on renal dialysis: Secondary | ICD-10-CM | POA: Diagnosis not present

## 2021-05-24 ENCOUNTER — Ambulatory Visit: Payer: 59 | Admitting: Student

## 2021-05-25 DIAGNOSIS — N186 End stage renal disease: Secondary | ICD-10-CM | POA: Diagnosis not present

## 2021-05-25 DIAGNOSIS — E875 Hyperkalemia: Secondary | ICD-10-CM | POA: Diagnosis not present

## 2021-05-25 DIAGNOSIS — Z992 Dependence on renal dialysis: Secondary | ICD-10-CM | POA: Diagnosis not present

## 2021-05-25 DIAGNOSIS — D689 Coagulation defect, unspecified: Secondary | ICD-10-CM | POA: Diagnosis not present

## 2021-05-25 DIAGNOSIS — N2581 Secondary hyperparathyroidism of renal origin: Secondary | ICD-10-CM | POA: Diagnosis not present

## 2021-05-28 ENCOUNTER — Encounter (HOSPITAL_COMMUNITY): Payer: Self-pay | Admitting: Internal Medicine

## 2021-05-28 ENCOUNTER — Emergency Department (HOSPITAL_COMMUNITY): Payer: Medicare Other

## 2021-05-28 ENCOUNTER — Other Ambulatory Visit: Payer: Self-pay

## 2021-05-28 ENCOUNTER — Observation Stay (HOSPITAL_COMMUNITY)
Admission: EM | Admit: 2021-05-28 | Discharge: 2021-05-31 | Disposition: A | Discharge status: Home / Self Care | Payer: Medicare Other | Attending: Internal Medicine | Admitting: Internal Medicine

## 2021-05-28 DIAGNOSIS — M2550 Pain in unspecified joint: Secondary | ICD-10-CM | POA: Diagnosis not present

## 2021-05-28 DIAGNOSIS — J449 Chronic obstructive pulmonary disease, unspecified: Secondary | ICD-10-CM | POA: Insufficient documentation

## 2021-05-28 DIAGNOSIS — Z20822 Contact with and (suspected) exposure to covid-19: Secondary | ICD-10-CM | POA: Insufficient documentation

## 2021-05-28 DIAGNOSIS — I10 Essential (primary) hypertension: Secondary | ICD-10-CM | POA: Diagnosis not present

## 2021-05-28 DIAGNOSIS — Z992 Dependence on renal dialysis: Secondary | ICD-10-CM | POA: Diagnosis not present

## 2021-05-28 DIAGNOSIS — R509 Fever, unspecified: Secondary | ICD-10-CM | POA: Diagnosis not present

## 2021-05-28 DIAGNOSIS — M130 Polyarthritis, unspecified: Secondary | ICD-10-CM | POA: Diagnosis not present

## 2021-05-28 DIAGNOSIS — Z79899 Other long term (current) drug therapy: Secondary | ICD-10-CM | POA: Insufficient documentation

## 2021-05-28 DIAGNOSIS — F17210 Nicotine dependence, cigarettes, uncomplicated: Secondary | ICD-10-CM | POA: Insufficient documentation

## 2021-05-28 DIAGNOSIS — E875 Hyperkalemia: Principal | ICD-10-CM | POA: Diagnosis present

## 2021-05-28 DIAGNOSIS — M79604 Pain in right leg: Secondary | ICD-10-CM | POA: Diagnosis present

## 2021-05-28 DIAGNOSIS — R21 Rash and other nonspecific skin eruption: Secondary | ICD-10-CM | POA: Insufficient documentation

## 2021-05-28 DIAGNOSIS — R42 Dizziness and giddiness: Secondary | ICD-10-CM | POA: Diagnosis not present

## 2021-05-28 DIAGNOSIS — I132 Hypertensive heart and chronic kidney disease with heart failure and with stage 5 chronic kidney disease, or end stage renal disease: Secondary | ICD-10-CM | POA: Insufficient documentation

## 2021-05-28 DIAGNOSIS — Z85038 Personal history of other malignant neoplasm of large intestine: Secondary | ICD-10-CM | POA: Diagnosis not present

## 2021-05-28 DIAGNOSIS — I5032 Chronic diastolic (congestive) heart failure: Secondary | ICD-10-CM | POA: Insufficient documentation

## 2021-05-28 DIAGNOSIS — M2559 Pain in other specified joint: Secondary | ICD-10-CM | POA: Diagnosis not present

## 2021-05-28 DIAGNOSIS — I2510 Atherosclerotic heart disease of native coronary artery without angina pectoris: Secondary | ICD-10-CM | POA: Diagnosis not present

## 2021-05-28 DIAGNOSIS — R5381 Other malaise: Secondary | ICD-10-CM | POA: Diagnosis not present

## 2021-05-28 DIAGNOSIS — Z7901 Long term (current) use of anticoagulants: Secondary | ICD-10-CM | POA: Insufficient documentation

## 2021-05-28 DIAGNOSIS — I776 Arteritis, unspecified: Secondary | ICD-10-CM | POA: Diagnosis not present

## 2021-05-28 DIAGNOSIS — N186 End stage renal disease: Secondary | ICD-10-CM | POA: Insufficient documentation

## 2021-05-28 DIAGNOSIS — R531 Weakness: Secondary | ICD-10-CM

## 2021-05-28 DIAGNOSIS — Z743 Need for continuous supervision: Secondary | ICD-10-CM | POA: Diagnosis not present

## 2021-05-28 DIAGNOSIS — R059 Cough, unspecified: Secondary | ICD-10-CM | POA: Diagnosis not present

## 2021-05-28 LAB — CBC WITH DIFFERENTIAL/PLATELET
Abs Immature Granulocytes: 0.11 10*3/uL — ABNORMAL HIGH (ref 0.00–0.07)
Basophils Absolute: 0 10*3/uL (ref 0.0–0.1)
Basophils Relative: 0 %
Eosinophils Absolute: 0.4 10*3/uL (ref 0.0–0.5)
Eosinophils Relative: 2 %
HCT: 37.3 % (ref 36.0–46.0)
Hemoglobin: 11.6 g/dL — ABNORMAL LOW (ref 12.0–15.0)
Immature Granulocytes: 1 %
Lymphocytes Relative: 5 %
Lymphs Abs: 0.9 10*3/uL (ref 0.7–4.0)
MCH: 29.1 pg (ref 26.0–34.0)
MCHC: 31.1 g/dL (ref 30.0–36.0)
MCV: 93.7 fL (ref 80.0–100.0)
Monocytes Absolute: 0.8 10*3/uL (ref 0.1–1.0)
Monocytes Relative: 4 %
Neutro Abs: 16.9 10*3/uL — ABNORMAL HIGH (ref 1.7–7.7)
Neutrophils Relative %: 88 %
Platelets: 216 10*3/uL (ref 150–400)
RBC: 3.98 MIL/uL (ref 3.87–5.11)
RDW: 16.5 % — ABNORMAL HIGH (ref 11.5–15.5)
WBC: 19.2 10*3/uL — ABNORMAL HIGH (ref 4.0–10.5)
nRBC: 0 % (ref 0.0–0.2)

## 2021-05-28 LAB — COMPREHENSIVE METABOLIC PANEL
ALT: 16 U/L (ref 0–44)
AST: 20 U/L (ref 15–41)
Albumin: 2.7 g/dL — ABNORMAL LOW (ref 3.5–5.0)
Alkaline Phosphatase: 111 U/L (ref 38–126)
Anion gap: 17 — ABNORMAL HIGH (ref 5–15)
BUN: 69 mg/dL — ABNORMAL HIGH (ref 8–23)
CO2: 15 mmol/L — ABNORMAL LOW (ref 22–32)
Calcium: 9.1 mg/dL (ref 8.9–10.3)
Chloride: 98 mmol/L (ref 98–111)
Creatinine, Ser: 12.78 mg/dL — ABNORMAL HIGH (ref 0.44–1.00)
GFR, Estimated: 3 mL/min — ABNORMAL LOW (ref >60)
Glucose, Bld: 79 mg/dL (ref 70–99)
Potassium: 6.9 mmol/L (ref 3.5–5.1)
Sodium: 130 mmol/L — ABNORMAL LOW (ref 135–145)
Total Bilirubin: 0.3 mg/dL (ref 0.3–1.2)
Total Protein: 7.2 g/dL (ref 6.5–8.1)

## 2021-05-28 LAB — RESP PANEL BY RT-PCR (FLU A&B, COVID) ARPGX2
Influenza A by PCR: NEGATIVE
Influenza B by PCR: NEGATIVE
SARS Coronavirus 2 by RT PCR: NEGATIVE

## 2021-05-28 LAB — PROTIME-INR
INR: 1.2 (ref 0.8–1.2)
Prothrombin Time: 14.9 seconds (ref 11.4–15.2)

## 2021-05-28 LAB — HIV ANTIBODY (ROUTINE TESTING W REFLEX): HIV Screen 4th Generation wRfx: NONREACTIVE

## 2021-05-28 LAB — MAGNESIUM: Magnesium: 2 mg/dL (ref 1.7–2.4)

## 2021-05-28 LAB — C-REACTIVE PROTEIN: CRP: 16.6 mg/dL — ABNORMAL HIGH (ref <1.0)

## 2021-05-28 LAB — SEDIMENTATION RATE: Sed Rate: 90 mm/hr — ABNORMAL HIGH (ref 0–22)

## 2021-05-28 IMAGING — DX DG CHEST 1V PORT
1 series · 1 of 1 positions shown · non-contrast
Comparison: Chest CT, [DATE]. Chest radiographs, [DATE] and
older studies.

CLINICAL DATA: Cough. Bilateral leg pain and rash beginning 3 days
ago after dialysis.

EXAM:
PORTABLE CHEST 1 VIEW

[chest]
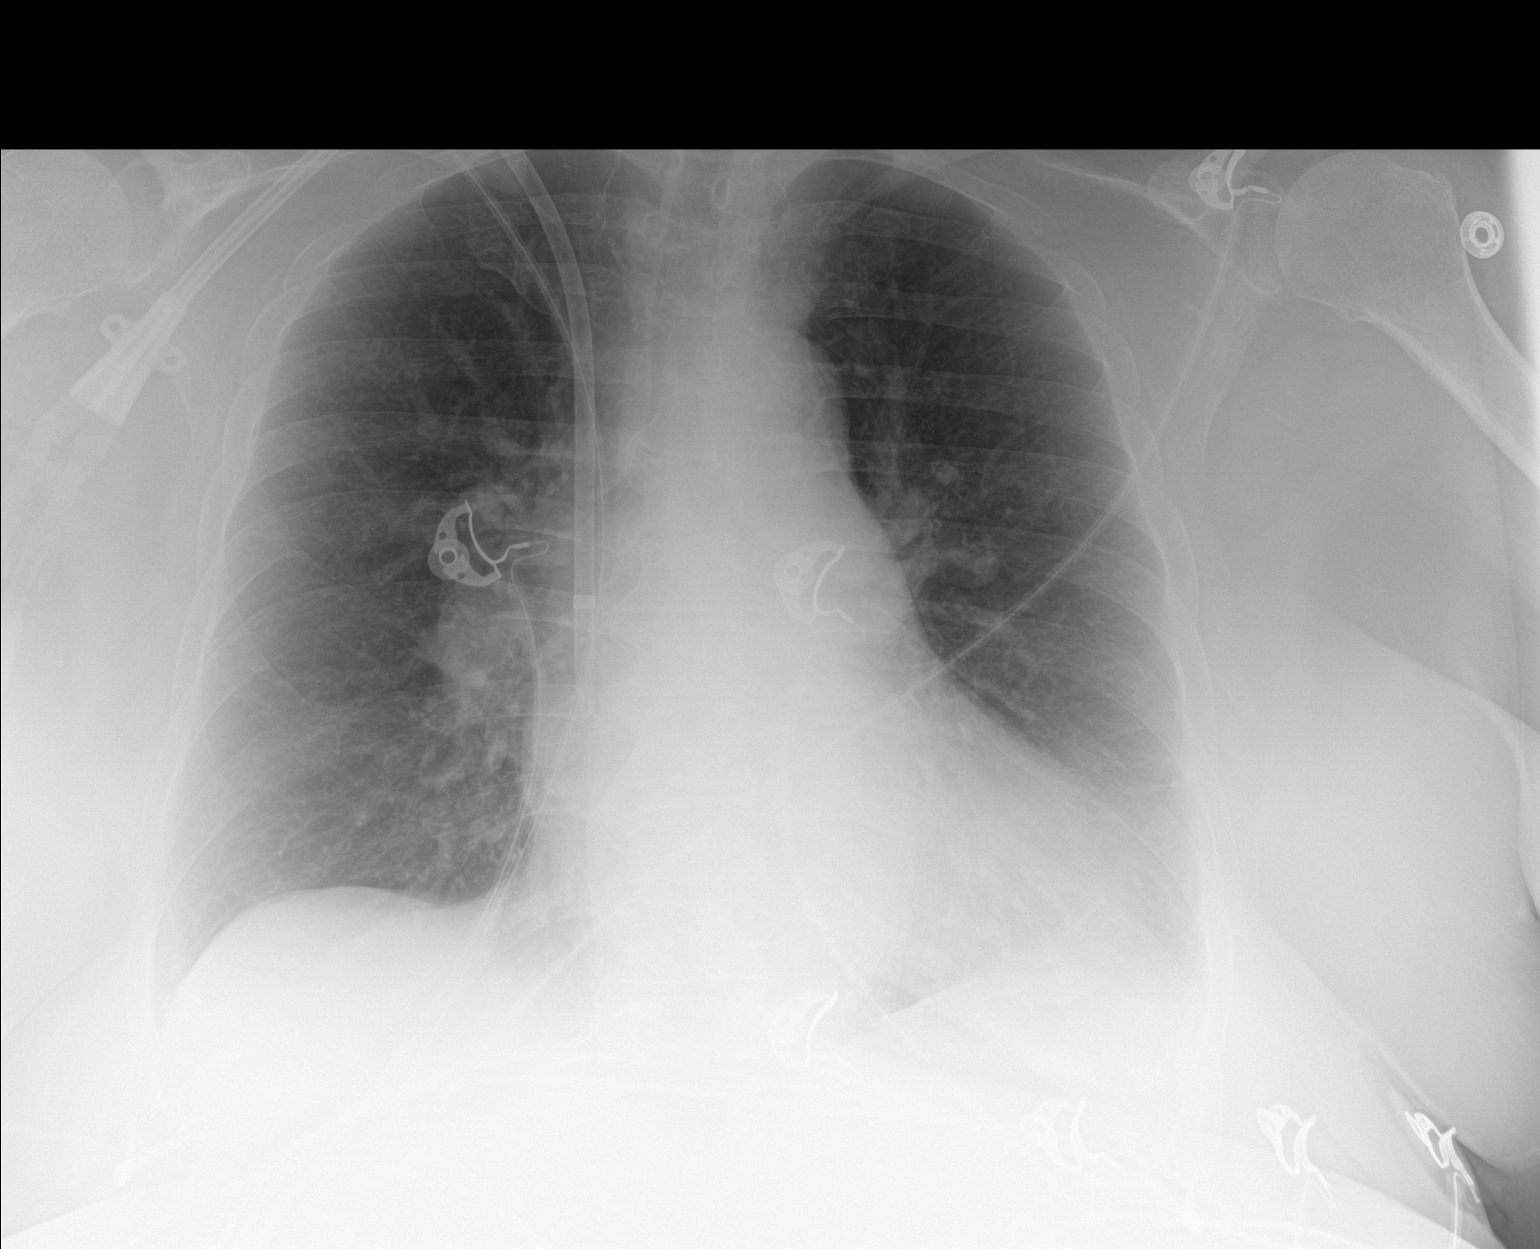

[1 of 1 positions shown; findings below may reference images not displayed]

FINDINGS: Cardiac silhouette normal in size.  No mediastinal or hilar masses.

Opacity at the left lung base consistent with atelectasis likely
with a small effusion as noted on the prior chest CT. Remainder of
the lungs is clear.

No pneumothorax.

Right internal jugular dual-lumen tunneled central venous catheter
is stable.

Skeletal structures are grossly intact.
IMPRESSION: No acute cardiopulmonary disease.

## 2021-05-28 MED ORDER — ALBUTEROL SULFATE (2.5 MG/3ML) 0.083% IN NEBU: 2.5000 mg | INHALATION_SOLUTION | RESPIRATORY_TRACT | Status: DC | PRN

## 2021-05-28 MED ORDER — SODIUM CHLORIDE 0.9% FLUSH
3.0000 mL | Freq: Two times a day (BID) | INTRAVENOUS | Status: DC
  Administered 2021-05-28 – 2021-05-31 (×6): 3 mL via INTRAVENOUS

## 2021-05-28 MED ORDER — FERRIC CITRATE 1 GM 210 MG(FE) PO TABS
420.0000 mg | ORAL_TABLET | Freq: Three times a day (TID) | ORAL | Status: DC
  Administered 2021-05-28 – 2021-05-31 (×8): 420 mg via ORAL
  Filled 2021-05-28 (×8): qty 2

## 2021-05-28 MED ORDER — ATORVASTATIN CALCIUM 10 MG PO TABS
10.0000 mg | ORAL_TABLET | Freq: Every day | ORAL | Status: DC
  Administered 2021-05-29 – 2021-05-31 (×3): 10 mg via ORAL
  Filled 2021-05-28 (×4): qty 1

## 2021-05-28 MED ORDER — HYDROXYZINE HCL 25 MG PO TABS: 25.0000 mg | ORAL_TABLET | Freq: Three times a day (TID) | ORAL | Status: DC | PRN

## 2021-05-28 MED ORDER — HYDRALAZINE HCL 20 MG/ML IJ SOLN: 5.0000 mg | INTRAMUSCULAR | Status: DC | PRN

## 2021-05-28 MED ORDER — ONDANSETRON HCL 4 MG PO TABS: 4.0000 mg | ORAL_TABLET | Freq: Four times a day (QID) | ORAL | Status: DC | PRN

## 2021-05-28 MED ORDER — RENA-VITE PO TABS
1.0000 | ORAL_TABLET | Freq: Every day | ORAL | Status: DC
  Administered 2021-05-28 – 2021-05-30 (×3): 1 via ORAL
  Filled 2021-05-28 (×3): qty 1

## 2021-05-28 MED ORDER — ACETAMINOPHEN 325 MG PO TABS
ORAL_TABLET | ORAL | Status: AC
  Filled 2021-05-28: qty 2

## 2021-05-28 MED ORDER — HYDROCODONE-ACETAMINOPHEN 5-325 MG PO TABS
1.0000 | ORAL_TABLET | ORAL | Status: DC | PRN
Start: 2021-05-28 — End: 2021-05-28

## 2021-05-28 MED ORDER — ZOLPIDEM TARTRATE 5 MG PO TABS
5.0000 mg | ORAL_TABLET | Freq: Every evening | ORAL | Status: DC | PRN
  Administered 2021-05-30: 5 mg via ORAL
  Filled 2021-05-28: qty 1

## 2021-05-28 MED ORDER — ACETAMINOPHEN 325 MG PO TABS
650.0000 mg | ORAL_TABLET | Freq: Four times a day (QID) | ORAL | Status: DC | PRN
  Administered 2021-05-28: 650 mg via ORAL
  Filled 2021-05-28: qty 2

## 2021-05-28 MED ORDER — FENTANYL CITRATE (PF) 100 MCG/2ML IJ SOLN
25.0000 ug | INTRAMUSCULAR | Status: DC | PRN
  Administered 2021-05-28: 25 ug via INTRAVENOUS
  Filled 2021-05-28: qty 2

## 2021-05-28 MED ORDER — POLYETHYLENE GLYCOL 3350 17 G PO PACK: 17.0000 g | PACK | Freq: Every day | ORAL | Status: DC | PRN

## 2021-05-28 MED ORDER — CARVEDILOL 12.5 MG PO TABS
12.5000 mg | ORAL_TABLET | Freq: Two times a day (BID) | ORAL | Status: DC
  Administered 2021-05-28 – 2021-05-31 (×6): 12.5 mg via ORAL
  Filled 2021-05-28 (×6): qty 1

## 2021-05-28 MED ORDER — METOCLOPRAMIDE HCL 5 MG/ML IJ SOLN
5.0000 mg | Freq: Once | INTRAMUSCULAR | Status: AC
  Administered 2021-05-28: 5 mg via INTRAVENOUS
  Filled 2021-05-28: qty 2

## 2021-05-28 MED ORDER — CALCIUM CARBONATE ANTACID 1250 MG/5ML PO SUSP
500.0000 mg | Freq: Four times a day (QID) | ORAL | Status: DC | PRN
  Filled 2021-05-28: qty 5

## 2021-05-28 MED ORDER — HEPARIN SODIUM (PORCINE) 1000 UNIT/ML IJ SOLN
INTRAMUSCULAR | Status: AC
  Filled 2021-05-28: qty 4

## 2021-05-28 MED ORDER — CHLORHEXIDINE GLUCONATE CLOTH 2 % EX PADS
6.0000 | MEDICATED_PAD | Freq: Every day | CUTANEOUS | Status: DC
  Administered 2021-05-28 – 2021-05-31 (×4): 6 via TOPICAL

## 2021-05-28 MED ORDER — DOCUSATE SODIUM 100 MG PO CAPS
100.0000 mg | ORAL_CAPSULE | Freq: Two times a day (BID) | ORAL | Status: DC
  Filled 2021-05-28 (×4): qty 1

## 2021-05-28 MED ORDER — CAMPHOR-MENTHOL 0.5-0.5 % EX LOTN
1.0000 "application " | TOPICAL_LOTION | Freq: Three times a day (TID) | CUTANEOUS | Status: DC | PRN
  Filled 2021-05-28: qty 222

## 2021-05-28 MED ORDER — ONDANSETRON HCL 4 MG/2ML IJ SOLN: 4.0000 mg | Freq: Four times a day (QID) | INTRAMUSCULAR | Status: DC | PRN

## 2021-05-28 MED ORDER — NEPRO/CARBSTEADY PO LIQD: 237.0000 mL | Freq: Three times a day (TID) | ORAL | Status: DC | PRN

## 2021-05-28 MED ORDER — CALCIUM GLUCONATE 10 % IV SOLN
1.0000 g | Freq: Once | INTRAVENOUS | Status: AC
  Administered 2021-05-28: 1 g via INTRAVENOUS
  Filled 2021-05-28: qty 10

## 2021-05-28 MED ORDER — SORBITOL 70 % SOLN
30.0000 mL | Status: DC | PRN
  Filled 2021-05-28: qty 30

## 2021-05-28 MED ORDER — ALBUTEROL SULFATE HFA 108 (90 BASE) MCG/ACT IN AERS: 2.0000 | INHALATION_SPRAY | Freq: Four times a day (QID) | RESPIRATORY_TRACT | Status: DC | PRN

## 2021-05-28 MED ORDER — DOCUSATE SODIUM 283 MG RE ENEM
1.0000 | ENEMA | RECTAL | Status: DC | PRN
  Filled 2021-05-28: qty 1

## 2021-05-28 MED ORDER — BISACODYL 5 MG PO TBEC: 5.0000 mg | DELAYED_RELEASE_TABLET | Freq: Every day | ORAL | Status: DC | PRN

## 2021-05-28 MED ORDER — ACETAMINOPHEN 650 MG RE SUPP: 650.0000 mg | Freq: Four times a day (QID) | RECTAL | Status: DC | PRN

## 2021-05-28 MED ORDER — SODIUM CHLORIDE 0.9 % IV BOLUS
500.0000 mL | Freq: Once | INTRAVENOUS | Status: AC
  Administered 2021-05-28: 500 mL via INTRAVENOUS

## 2021-05-28 MED ORDER — APIXABAN 2.5 MG PO TABS
2.5000 mg | ORAL_TABLET | Freq: Two times a day (BID) | ORAL | Status: DC
  Administered 2021-05-28 – 2021-05-31 (×7): 2.5 mg via ORAL
  Filled 2021-05-28 (×7): qty 1

## 2021-05-28 MED ORDER — KETOROLAC TROMETHAMINE 15 MG/ML IJ SOLN
7.5000 mg | Freq: Four times a day (QID) | INTRAMUSCULAR | Status: DC | PRN
  Administered 2021-05-28 – 2021-05-29 (×2): 7.5 mg via INTRAVENOUS
  Filled 2021-05-28 (×2): qty 1

## 2021-05-28 MED ORDER — AMLODIPINE BESYLATE 5 MG PO TABS
5.0000 mg | ORAL_TABLET | Freq: Every day | ORAL | Status: DC
  Administered 2021-05-29: 5 mg via ORAL
  Filled 2021-05-28 (×2): qty 1

## 2021-05-28 MED ORDER — DEXTROSE 50 % IV SOLN
1.0000 | Freq: Once | INTRAVENOUS | Status: AC
  Administered 2021-05-28: 50 mL via INTRAVENOUS
  Filled 2021-05-28: qty 50

## 2021-05-28 MED ORDER — PANTOPRAZOLE SODIUM 40 MG PO TBEC: 40.0000 mg | DELAYED_RELEASE_TABLET | Freq: Every day | ORAL | Status: DC

## 2021-05-28 MED ORDER — HYDROMORPHONE HCL 1 MG/ML IJ SOLN
1.0000 mg | Freq: Once | INTRAMUSCULAR | Status: AC
  Administered 2021-05-28: 1 mg via INTRAVENOUS
  Filled 2021-05-28: qty 1

## 2021-05-28 MED ORDER — INSULIN ASPART 100 UNIT/ML IV SOLN
5.0000 [IU] | Freq: Once | INTRAVENOUS | Status: AC
  Administered 2021-05-28: 5 [IU] via INTRAVENOUS

## 2021-05-28 NOTE — Consult Note (Signed)
Hospital Consult    Reason for Consult: Evaluate for vasculitis, possible biopsy Referring Physician: Dr. Lorin Mercy, Hospitalist MRN #:  [id number]  History of Present Illness: This is a 65 y.o. female with history of end-stage renal disease on hemodialysis, history of colon cancer in 2009, anemia of chronic disease, history of recurrent DVT PE on Eliquis indefinitely per medical oncology notes, COPD that vascular surgery was consulted to evaluate for vasculitis and possible biopsy.  Reportedly has developed a rash on her lower extremities with arthralgias.  CRP is elevated at 16 and ESR elevated at 90.  WBC elevated at 19.2.  Also elevated K and last dialysis Friday.  Patient states her symptoms started on Friday night with initial pain in the joints in her hands and fingers as well as pain in the joints of her lower extremities.  Sounds like the ankle joints and the knee joints bothered her the most but she also has developed a rash on both lower extremities that is very painful to touch.  She states this does not itch.  She did recently start a new medicine for GERD.  Past Medical History:  Diagnosis Date  . Anemia   . Anxiety   . CHF (congestive heart failure) ([place])   . Colon cancer [facility])    treatment surgery  . Complication of anesthesia    after first C- [place] "couldnt walk after", patient denies having a spinal  . COPD (chronic obstructive pulmonary disease) ([place])   . Coronary artery disease   . Depression   . DVT (deep venous thrombosis) ([place])   . ESRD (end stage renal disease) ([place])    Hemo: MWF  . History of blood transfusion 04/2018  . Hypertension    07/07/18- no longer takes BP medications  . Meningitis   . Pain in limb 07/30/2013  . PE (pulmonary embolism)   . Peripheral vascular disease ([place])   . Restless legs   . Shortness of breath    with exertion  . Sleep apnea   . SOB (shortness of breath) 03/03/2019  . Vertigo     Past Surgical History:  Procedure Laterality  Date  . ABDOMINAL AORTAGRAM N/A 07/26/2013   Procedure: ABDOMINAL Maxcine Ham;  Surgeon: [name] Odin, MD;  Location: [facility] CATH LAB;  Service: Cardiovascular;  Laterality: N/A;  . ABDOMINAL HYSTERECTOMY    . AV FISTULA PLACEMENT Left 05/05/2018   Procedure: ARTERIOVENOUS (AV) FISTULA CREATION BRACHIOCEPHALIC;  Surgeon: Waynetta Sandy, MD;  Location: [place];  Service: Vascular;  Laterality: Left;  . AV FISTULA PLACEMENT Left 07/16/2018   Procedure: ARTERIOVENOUS FISTULA CREATION;  Surgeon: Waynetta Sandy, MD;  Location: [place];  Service: Vascular;  Laterality: Left;  . BASCILIC VEIN TRANSPOSITION Left 09/03/2018   Procedure: BASILIC VEIN TRANSPOSITION SECOND STAGE;  Surgeon: Waynetta Sandy, MD;  Location: [place];  Service: Vascular;  Laterality: Left;  . BREAST BIOPSY Left   . CESAREAN SECTION     X 3 [id number]  . CHOLECYSTECTOMY    . CHOLECYSTECTOMY  1980/s  . COLECTOMY  2010  . DIVERTICULOSIS SURGERY-2002  2012  . FEMORAL-POPLITEAL BYPASS GRAFT Left 08/13/2013   Procedure: BYPASS GRAFT FEMORAL-POPLITEAL ARTERY WITH NON-REVERSED SAPHANEOUS VEIN; ULTRASOUND GUIDED;  Surgeon: Mal [name], MD;  Location: [place];  Service: Vascular;  Laterality: Left;  . INSERTION OF DIALYSIS CATHETER Right 04/30/2018   Procedure: INSERTION OF Right Internal Jugular DIALYSIS CATHETER;  Surgeon: [name], MD;  Location: [place];  Service: Vascular;  Laterality:  Right;  . IR FLUORO GUIDE CV LINE RIGHT  07/24/2020  . IR REMOVAL TUN CV CATH W/O FL  07/21/2020  . IR US GUIDE VASC ACCESS RIGHT  07/24/2020  . LEFT HEART CATH AND CORONARY ANGIOGRAPHY N/A 05/01/2021   Procedure: LEFT HEART CATH AND CORONARY ANGIOGRAPHY;  Surgeon: [name], MD;  Location: [place] CV LAB;  Service: Cardiovascular;  Laterality: N/A;  . LOWER EXTREMITY ANGIOGRAM Left 07/26/2013   Procedure: LOWER EXTREMITY ANGIOGRAM;  Surgeon: [name] Hornbrook, MD;  Location: [facility] CATH LAB;  Service: Cardiovascular;  Laterality: Left;     Allergies  Allergen Reactions  . Carnosine     Other reaction(s): Unknown  . Gadolinium Derivatives Hives and Other (See Comments)    HIVES, Desc: HIVES W/ "DYE" USED FOR 1ST CT SCAN BUT NOT 2ND, NO PREMEDS USED, PT UNCERTAIN OF CIRCUMSTANCES,,?POSSIBLE MRI CONTRAST ALLERGY, ALL STUDIES DONE "SOMEWHERE" IN [place]//A.C., Onset Date: [id number]  . Iohexol Other (See Comments)     Code: HIVES, Desc: HIVES W/ "DYE" USED FOR 1ST CT SCAN BUT NOT 2ND, NO PREMEDS USED, PT UNCERTAIN OF CIRCUMSTANCES,,?POSSIBLE MRI CONTRAST ALLERGY, ALL STUDIES DONE "SOMEWHERE" IN [place]//A.C., Onset Date: [id number]   . Iodine Hives  . Naltrexone     Other reaction(s): Unknown    Prior to Admission medications   Medication Sig Start Date End Date Taking? Authorizing Provider  acetaminophen (TYLENOL) 500 MG tablet Take 1,000 mg by mouth 2 (two) times daily as needed for moderate pain or headache.   Yes [provider]  albuterol (VENTOLIN HFA) 108 (90 Base) MCG/ACT inhaler Inhale 2 puffs into the lungs every 6 (six) hours as needed for wheezing or shortness of breath. 08/03/20  Yes [name], FNP  amLODipine (NORVASC) 5 MG tablet Take 1 tablet (5 mg total) by mouth daily. 09/28/20 09/28/21 Yes [name], FNP  apixaban (ELIQUIS) 2.5 MG TABS tablet Take 1 tablet (2.5 mg total) by mouth 2 (two) times daily. 05/03/21  Yes [name], MD  Ascorbic Acid (VITAMIN C) 100 MG tablet Take 100 mg by mouth daily.   Yes [provider]  atorvastatin (LIPITOR) 10 MG tablet Take 1 tablet (10 mg total) by mouth daily. 03/01/21  Yes Cantwell, Celeste C, PA-C  AURYXIA 1 GM 210 MG(Fe) tablet Take 420-840 mg by mouth with breakfast, with lunch, and with evening meal. Taking  2 tablets daily ($RemoveBefore'420mg'[id number]$ ) with each snack 05/15/21  Yes [provider]  Biotin 10000 MCG TABS Take 10,000 mcg by mouth daily.    Yes [provider]  carvedilol (COREG) 12.5 MG tablet Take 12.5 mg by mouth 2 (two) times  daily with a meal.    Yes [provider]  ibuprofen (ADVIL) 200 MG tablet Take 200-400 mg by mouth every 6 (six) hours as needed for mild pain.   Yes [provider]  meclizine (ANTIVERT) 12.5 MG tablet Take 1 tablet (12.5 mg total) by mouth 3 (three) times daily as needed for dizziness. 04/12/21  Yes Glendale Chard, MD  multivitamin (RENA-VIT) TABS tablet Take 1 tablet by mouth at bedtime.   Yes Edrick Oh, MD  mupirocin ointment (BACTROBAN) 2 % Apply 1 application topically daily as needed (skin bumps). 04/15/19  Yes [name], FNP  nystatin (NYSTATIN) powder Apply 1 application topically 4 (four) times daily as needed. Patient taking differently: Apply 1 application topically 4 (four) times daily as needed (irriation). 08/03/20  Yes [name], FNP  omeprazole (PRILOSEC) 20 MG capsule Take 1 capsule (  20 mg total) by mouth daily. 05/17/21  Yes Truitt Merle, MD  vitamin E 200 UNIT capsule Take 200 Units by mouth daily.   Yes [provider]  lanthanum (FOSRENOL) 1000 MG chewable tablet Chew 1,000 mg by mouth See admin instructions. Take 1000 mg with each meal and each snack    [provider]    Social History   Socioeconomic History  . Marital status: Single    Spouse name: Not on file  . Number of children: 4  . Years of education: 12th gd  . Highest education level: Not on file  Occupational History  . Occupation: N/A  Tobacco Use  . Smoking status: Current Every Day Smoker    Packs/day: 0.50    Years: 50.00    Pack years: 25.00    Types: Cigarettes    Last attempt to quit: 02/03/2021    Years since quitting: 0.3  . Smokeless tobacco: Former Network engineer  . Vaping Use: Never used  Substance and Sexual Activity  . Alcohol use: No    Alcohol/week: 0.0 standard drinks    Comment: she used to drink alcohol, quit in 2010; h/o heavy use   . Drug use: Yes    Types: Oxycodone  . Sexual activity: Not Currently  Other Topics Concern  . Not on  file  Social History Narrative   2 cups of coffee a day    Social Determinants of Health   Financial Resource Strain: Low Risk   . Difficulty of Paying Living Expenses: Not hard at all  Food Insecurity: No Food Insecurity  . Worried About Charity fundraiser in the Last Year: Never true  . Ran Out of Food in the Last Year: Never true  Transportation Needs: No Transportation Needs  . Lack of Transportation (Medical): No  . Lack of Transportation (Non-Medical): No  Physical Activity: Inactive  . Days of Exercise per Week: 0 days  . Minutes of Exercise per Session: 0 min  Stress: No Stress Concern Present  . Feeling of Stress : Not at all  Social Connections: Not on file  Intimate Partner Violence: Not on file     Family History  Problem Relation Age of Onset  . Cancer Mother 59       breast and bone  . Cancer Father 32       prostate  . Hypertension Sister   . Bleeding Disorder Sister   . Cancer Cousin 20       breast cancer   . Hypertension Daughter   . Breast cancer Neg Hx     ROS: [x]  Positive   [ ]  Negative   [ ]  All sytems reviewed and are negative  Cardiovascular: []  chest pain/pressure []  palpitations []  SOB lying flat []  DOE []  pain in legs while walking []  pain in legs at rest []  pain in legs at night []  non-healing ulcers []  hx of DVT []  swelling in legs  Pulmonary: []  productive cough []  asthma/wheezing []  home O2  Neurologic: []  weakness in []  arms []  legs []  numbness in []  arms []  legs []  hx of CVA []  mini stroke [] difficulty speaking or slurred speech []  temporary loss of vision in one eye []  dizziness  Hematologic: []  hx of cancer []  bleeding problems []  problems with blood clotting easily  Endocrine:   []  diabetes []  thyroid disease  GI []  vomiting blood []  blood in stool  GU: []  CKD/renal failure []  HD--[]  M/W/F or []   T/T/S []  burning with urination []  blood in urine  Psychiatric: []  anxiety []   depression  Musculoskeletal: []  arthritis []  joint pain  Integumentary: []  rashes []  ulcers  Constitutional: []  fever []  chills   Physical Examination  Vitals:   05/28/21 1149 05/28/21 1241  BP: 119/82 (!) 153/88  Pulse: (!) 104 (!) 103  Resp: 20 16  Temp: (!) 97.5 F (36.4 C) 98.4 F (36.9 C)  SpO2: 96% 99%   There is no height or weight on file to calculate BMI.  General:  NAD Gait: Not observed HENT: WNL, normocephalic Pulmonary: normal non-labored breathing Cardiac: regular, without  Murmurs, rubs or gallops;  carotid bruits Abdomen: soft, NT/ND Vascular Exam/Pulses: Palpable radial pulses bilaterally Palpable femoral pulses bilaterally Palpable DP pulses bilateral No signs of distal peripheral ischemia Musculoskeletal: no muscle wasting or atrophy  Neurologic: A&O X 3; Appropriate Affect ; SENSATION: normal; MOTOR FUNCTION:  moving all extremities equally. Speech is fluent/normal      CBC    Component Value Date/Time   WBC 19.2 (H) 05/28/2021 0842   RBC 3.98 05/28/2021 0842   HGB 11.6 (L) 05/28/2021 0842   HGB 11.8 04/25/2021 1128   HGB 10.9 (L) 07/07/2017 1235   HCT 37.3 05/28/2021 0842   HCT 34.6 04/25/2021 1128   HCT 35.0 07/07/2017 1235   PLT 216 05/28/2021 0842   PLT 183 04/25/2021 1128   MCV 93.7 05/28/2021 0842   MCV 89 04/25/2021 1128   MCV 85.2 07/07/2017 1235   MCH 29.1 05/28/2021 0842   MCHC 31.1 05/28/2021 0842   RDW 16.5 (H) 05/28/2021 0842   RDW 15.7 (H) 04/25/2021 1128   RDW 15.8 (H) 07/07/2017 1235   LYMPHSABS 0.9 05/28/2021 0842   LYMPHSABS 2.4 07/07/2017 1235   MONOABS 0.8 05/28/2021 0842   MONOABS 0.8 07/07/2017 1235   EOSABS 0.4 05/28/2021 0842   EOSABS 0.2 07/07/2017 1235   BASOSABS 0.0 05/28/2021 0842   BASOSABS 0.0 07/07/2017 1235    BMET    Component Value Date/Time   NA 130 (L) 05/28/2021 0842   NA 135 04/25/2021 1128   NA 138 07/07/2017 1235   K 6.9 (HH) 05/28/2021 0842   K 4.5 07/07/2017 1235   CL 98  05/28/2021 0842   CL 111 (H) 12/04/2012 1056   CO2 15 (L) 05/28/2021 0842   CO2 17 (L) 07/07/2017 1235   GLUCOSE 79 05/28/2021 0842   GLUCOSE 85 07/07/2017 1235   GLUCOSE 97 12/04/2012 1056   BUN 69 (H) 05/28/2021 0842   BUN 13 04/25/2021 1128   BUN 40.8 (H) 07/07/2017 1235   CREATININE 12.78 (H) 05/28/2021 0842   CREATININE 3.4 (HH) 07/07/2017 1235   CALCIUM 9.1 05/28/2021 0842   CALCIUM 9.5 07/07/2017 1235   GFRNONAA 3 (L) 05/28/2021 0842   GFRAA 7 (L) 05/18/2020 1036    COAGS: Lab Results  Component Value Date   INR 1.2 05/28/2021   INR 0.98 07/16/2018   INR 1.19 05/07/2018     Non-Invasive Vascular Imaging:    None   ASSESSMENT/PLAN: This is a 65 y.o. female with multiple medical comorbidities as noted above that presents with very vague symptoms including diffuse arthralgias and lower extremity pain and a very vague rash on her lower extremities.  Vascular surgery was asked to evaluate for possible vasculitis and if a biopsy would be indicated.   It is a very unusual presentation and I am unclear about the etiology at this time.  She does have an  elevated CRP and ESR as well as an elevated white count.  No headaches to suggest temporal arteritis.. No evidence of other large vessel vasculitis like Takayasu's.  She has easily palpable pulses in her extremities and no signs of distal ischemia to suggest medium or large vessel vasculitis and I don't think an arteriogram is warranted.  She is getting dialysis at this time for her uremia with a markedly elevated potassium and will be interesting if any of her symptoms improve.  I am not really sure what role a skin biopsy would play at this time.  It may be reasonable to give her a trial of steroids and have her follow-up with rheumatology for further work-up.  Vascular will follow.    My only other thought is that she did start a new medication about a week ago.  Sound like this was Prilosec for reflux.  May be reasonable to hold  that to ensure this is not a drug reaction.   Marty Heck, MD [facility] Office: [place]

## 2021-05-28 NOTE — H&P (Signed)
History and Physical    Whitney Walker UEK:[id number] DOB: 05-04-1956 DOA: 05/28/2021  PCP: Whitney Brine, FNP Consultants:  Whitney Walker - heme/onc; [facility] - cardiology; [facility] - nephrology Patient coming from:  Home - lives alone; NOK: Daughter, Whitney Walker, [phone]  Chief Complaint: Leg pain and rash  HPI: Whitney Walker is a 65 y.o. female with medical history significant of OSA; PVD; ESRD on MWF HD; DVT/PE; CAD; COPD; and colon cancer presenting with leg pain and rash.  She reports that on Friday after HD, she had finger edema and mild pain but she could walk.  It got worse, started under her heel without bruising and then the pain progressed upward from ankle to knee on the L.  Then it moved to her R leg - ankle and up the R leg.  Now the pain goes all the way up into her groin area.  Now it is in her shoulder blade on the L, painful to touch or with movement.  She is having pain in her R thumb and R fingers and also in the small joints on the L.  She had chills.  She is having excruciating pain to bend her legs or try to get up.  No h/o similar.  She noticed a ?bruise on her L leg and then some on the R leg and they are painful to touch as well - she didn't really notice this until she was in the ER.  Emesis x 2 in the ER.   ED Course: Hyperkalemia - given temporizing agents and will be taken for HD.  Polyarthralgias after HD Friday, worsening since.  Also with rash.  Hard to stand, skin painful to touch.  Nodular rash mostly on legs, some petechiae on ankles.  No obvious joint swelling.  WBC 20k.  ESR/CRP markedly elevated.    Review of Systems: As per HPI; otherwise review of systems reviewed and negative.   Ambulatory Status:  Ambulates with a walker  COVID Vaccine Status:  Complete plus booster  Past Medical History:  Diagnosis Date  . Anemia   . Anxiety   . CHF (congestive heart failure) ([place])   . Colon cancer [facility])    treatment surgery  . Complication of anesthesia     after first C- [place] "couldnt walk after", patient denies having a spinal  . COPD (chronic obstructive pulmonary disease) ([place])   . Coronary artery disease   . Depression   . DVT (deep venous thrombosis) ([place])   . ESRD (end stage renal disease) ([place])    Hemo: MWF  . History of blood transfusion 04/2018  . Hypertension    07/07/18- no longer takes BP medications  . Meningitis   . Pain in limb 07/30/2013  . PE (pulmonary embolism)   . Peripheral vascular disease ([place])   . Restless legs   . Shortness of breath    with exertion  . Sleep apnea   . SOB (shortness of breath) 03/03/2019  . Vertigo     Past Surgical History:  Procedure Laterality Date  . ABDOMINAL AORTAGRAM N/A 07/26/2013   Procedure: ABDOMINAL Maxcine Ham;  Surgeon: [name] Troy, MD;  Location: [facility] CATH LAB;  Service: Cardiovascular;  Laterality: N/A;  . ABDOMINAL HYSTERECTOMY    . AV FISTULA PLACEMENT Left 05/05/2018   Procedure: ARTERIOVENOUS (AV) FISTULA CREATION BRACHIOCEPHALIC;  Surgeon: Waynetta Sandy, MD;  Location: [place];  Service: Vascular;  Laterality: Left;  . AV FISTULA PLACEMENT Left 07/16/2018   Procedure: ARTERIOVENOUS FISTULA  CREATION;  Surgeon: Waynetta Sandy, MD;  Location: [place];  Service: Vascular;  Laterality: Left;  . BASCILIC VEIN TRANSPOSITION Left 09/03/2018   Procedure: BASILIC VEIN TRANSPOSITION SECOND STAGE;  Surgeon: Waynetta Sandy, MD;  Location: [place];  Service: Vascular;  Laterality: Left;  . BREAST BIOPSY Left   . CESAREAN SECTION     X 3 [id number]  . CHOLECYSTECTOMY    . CHOLECYSTECTOMY  1980/s  . COLECTOMY  2010  . DIVERTICULOSIS SURGERY-2002  2012  . FEMORAL-POPLITEAL BYPASS GRAFT Left 08/13/2013   Procedure: BYPASS GRAFT FEMORAL-POPLITEAL ARTERY WITH NON-REVERSED SAPHANEOUS VEIN; ULTRASOUND GUIDED;  Surgeon: Mal [name], MD;  Location: [place];  Service: Vascular;  Laterality: Left;  . INSERTION OF DIALYSIS CATHETER Right 04/30/2018   Procedure: INSERTION OF  Right Internal Jugular DIALYSIS CATHETER;  Surgeon: [name], MD;  Location: [place];  Service: Vascular;  Laterality: Right;  . IR FLUORO GUIDE CV LINE RIGHT  07/24/2020  . IR REMOVAL TUN CV CATH W/O FL  07/21/2020  . IR US GUIDE VASC ACCESS RIGHT  07/24/2020  . LEFT HEART CATH AND CORONARY ANGIOGRAPHY N/A 05/01/2021   Procedure: LEFT HEART CATH AND CORONARY ANGIOGRAPHY;  Surgeon: [name], MD;  Location: [place] CV LAB;  Service: Cardiovascular;  Laterality: N/A;  . LOWER EXTREMITY ANGIOGRAM Left 07/26/2013   Procedure: LOWER EXTREMITY ANGIOGRAM;  Surgeon: [name] Milford Mill, MD;  Location[facility] CATH LAB;  Service: Cardiovascular;  Laterality: Left;    Social History   Socioeconomic History  . Marital status: Single    Spouse name: Not on file  . Number of children: 4  . Years of education: 12th gd  . Highest education level: Not on file  Occupational History  . Occupation: N/A  Tobacco Use  . Smoking status: Current Every Day Smoker    Packs/day: 0.50    Years: 50.00    Pack years: 25.00    Types: Cigarettes    Last attempt to quit: 02/03/2021    Years since quitting: 0.3  . Smokeless tobacco: Former Network engineer  . Vaping Use: Never used  Substance and Sexual Activity  . Alcohol use: No    Alcohol/week: 0.0 standard drinks    Comment: she used to drink alcohol, quit in 2010; h/o heavy use   . Drug use: Yes    Types: Oxycodone  . Sexual activity: Not Currently  Other Topics Concern  . Not on file  Social History Narrative   2 cups of coffee a day    Social Determinants of Health   Financial Resource Strain: Low Risk   . Difficulty of Paying Living Expenses: Not hard at all  Food Insecurity: No Food Insecurity  . Worried About Charity fundraiser in the Last Year: Never true  . Ran Out of Food in the Last Year: Never true  Transportation Needs: No Transportation Needs  . Lack of Transportation (Medical): No  . Lack of Transportation (Non-Medical): No  Physical  Activity: Inactive  . Days of Exercise per Week: 0 days  . Minutes of Exercise per Session: 0 min  Stress: No Stress Concern Present  . Feeling of Stress : Not at all  Social Connections: Not on file  Intimate Partner Violence: Not on file    Allergies  Allergen Reactions  . Carnosine     Other reaction(s): Unknown  . Gadolinium Derivatives Hives and Other (See Comments)    HIVES, Desc: HIVES W/ "DYE" USED  FOR 1ST CT SCAN BUT NOT 2ND, NO PREMEDS USED, PT UNCERTAIN OF CIRCUMSTANCES,,?POSSIBLE MRI CONTRAST ALLERGY, ALL STUDIES DONE "SOMEWHERE" IN [place]//A.C., Onset Date: [id number]  . Iohexol Other (See Comments)     Code: HIVES, Desc: HIVES W/ "DYE" USED FOR 1ST CT SCAN BUT NOT 2ND, NO PREMEDS USED, PT UNCERTAIN OF CIRCUMSTANCES,,?POSSIBLE MRI CONTRAST ALLERGY, ALL STUDIES DONE "SOMEWHERE" IN [place]//A.C., Onset Date: [id number]   . Iodine Hives  . Naltrexone     Other reaction(s): Unknown    Family History  Problem Relation Age of Onset  . Cancer Mother 69       breast and bone  . Cancer Father 64       prostate  . Hypertension Sister   . Bleeding Disorder Sister   . Cancer Cousin 20       breast cancer   . Hypertension Daughter   . Breast cancer Neg Hx     Prior to Admission medications   Medication Sig Start Date End Date Taking? Authorizing Provider  acetaminophen (TYLENOL) 500 MG tablet Take 1,000 mg by mouth 2 (two) times daily as needed for moderate pain or headache.   Yes [provider]  albuterol (VENTOLIN HFA) 108 (90 Base) MCG/ACT inhaler Inhale 2 puffs into the lungs every 6 (six) hours as needed for wheezing or shortness of breath. 08/03/20  Yes Whitney Brine, FNP  amLODipine (NORVASC) 5 MG tablet Take 1 tablet (5 mg total) by mouth daily. 09/28/20 09/28/21 Yes Whitney Brine, FNP  apixaban (ELIQUIS) 2.5 MG TABS tablet Take 1 tablet (2.5 mg total) by mouth 2 (two) times daily. 05/03/21  Yes [name], MD  Ascorbic Acid (VITAMIN C) 100 MG tablet Take  100 mg by mouth daily.   Yes [provider]  atorvastatin (LIPITOR) 10 MG tablet Take 1 tablet (10 mg total) by mouth daily. 03/01/21  Yes Cantwell, Celeste C, PA-C  AURYXIA 1 GM 210 MG(Fe) tablet Take 420-840 mg by mouth with breakfast, with lunch, and with evening meal. Taking  2 tablets daily (439m) with each snack 05/15/21  Yes [provider]  Biotin 10000 MCG TABS Take 10,000 mcg by mouth daily.    Yes [provider]  carvedilol (COREG) 12.5 MG tablet Take 12.5 mg by mouth 2 (two) times daily with a meal.    Yes [provider]  ibuprofen (ADVIL) 200 MG tablet Take 200-400 mg by mouth every 6 (six) hours as needed for mild pain.   Yes [provider]  meclizine (ANTIVERT) 12.5 MG tablet Take 1 tablet (12.5 mg total) by mouth 3 (three) times daily as needed for dizziness. 04/12/21  Yes SGlendale Chard MD  multivitamin (RENA-VIT) TABS tablet Take 1 tablet by mouth at bedtime.   Yes WEdrick Oh MD  mupirocin ointment (BACTROBAN) 2 % Apply 1 application topically daily as needed (skin bumps). 04/15/19  Yes MMinette Brine FNP  nystatin (NYSTATIN) powder Apply 1 application topically 4 (four) times daily as needed. Patient taking differently: Apply 1 application topically 4 (four) times daily as needed (irriation). 08/03/20  Yes MMinette Brine FNP  omeprazole (PRILOSEC) 20 MG capsule Take 1 capsule (20 mg total) by mouth daily. 05/17/21  Yes FTruitt Merle MD  vitamin E 200 UNIT capsule Take 200 Units by mouth daily.   Yes [provider]  lanthanum (FOSRENOL) 1000 MG chewable tablet Chew 1,000 mg by mouth See admin instructions. Take 1000 mg with each meal and each snack    [provider]  oxyCODONE-acetaminophen (PERCOCET) 5-325 MG tablet Take 2 tablets by mouth every 8 (eight) hours as needed for severe pain. Patient not taking: Reported on 05/28/2021 02/06/21   Mesner, Corene Cornea, MD  Vitamin D, Ergocalciferol, (DRISDOL) 1.25 MG (50000 UNIT) CAPS  capsule Take 1 capsule (50,000 Units total) by mouth every 7 (seven) days. Patient not taking: Reported on 05/28/2021 04/12/21   Whitney Brine, FNP    Physical Exam: Vitals:   05/28/21 0926 05/28/21 1000 05/28/21 1149 05/28/21 1241  BP: 112/63 136/71 119/82 (!) 153/88  Pulse: 88 82 (!) 104 (!) 103  Resp: 16 15 20 16   Temp:   (!) 97.5 F (36.4 C) 98.4 F (36.9 C)  TempSrc:   Oral Oral  SpO2: 95% 97% 96% 99%     . General:  Appears calm but uncomfortable, reluctant to move . Eyes:  PERRL, EOMI, normal lids, iris . ENT:  grossly normal hearing, lips & tongue, mmm; edentulous . Neck:  no LAD, masses or thyromegaly; R TDC in place . Cardiovascular:  RRR, no m/r/g.  . Respiratory:   CTA bilaterally with no wheezes/rales/rhonchi.  Normal respiratory effort. . Abdomen:  soft, NT, ND . Skin:  Diffuse papular rash that is palpable, fairly subtle but apparent on most surfaces, very TTP       . Musculoskeletal:  Reluctant to move LE due to pain . Lower extremity:  No LE edema.  Limited foot exam with no ulcerations.  2+ distal pulses. [name] Psychiatric:  grossly normal mood and affect, speech fluent and appropriate, AOx3 . Neurologic:  CN 2-12 grossly intact    Radiological Exams on Admission: Independently reviewed - see discussion in A/P where applicable  DG Chest Port 1 View  Result Date: 05/28/2021 CLINICAL DATA:  Cough. Bilateral leg pain and rash beginning 3 days ago after dialysis. EXAM: PORTABLE CHEST 1 VIEW COMPARISON:  Chest CT, 02/27/2021. Chest radiographs, 02/05/2021 and older studies. FINDINGS: Cardiac silhouette normal in size.  No mediastinal or hilar masses. Opacity at the left lung base consistent with atelectasis likely with a small effusion as noted on the prior chest CT. Remainder of the lungs is clear. No pneumothorax. Right internal jugular dual-lumen tunneled central venous catheter is stable. Skeletal structures are grossly intact. IMPRESSION: No acute  cardiopulmonary disease. Electronically Signed   By: Lajean Manes M.D.   On: 05/28/2021 09:04    EKG: Independently reviewed.  NSR with rate 85; LAFB; peaked T waves  Labs on Admission: I have personally reviewed the available labs and imaging studies at the time of the admission.  Pertinent labs:   Na++ 130 K+ 6.9 CO2 15 BUN 69/Creatinine 12.78/GFR 3 Anion gap 17 CRP 16.6 WBC 19.2 Hgb 11.6 INR 1.2 COVID/flu negative   Assessment/Plan Principal Problem:   Hyperkalemia, diminished renal excretion Active Problems:   Essential hypertension   ESRD on dialysis (HCC)   Generalized weakness   Rash and nonspecific skin eruption   Hyperkalemia associated with ESRD -Patient on chronic MWF HD -Other symptoms started Friday after HD (see below) -K+ is 6.9 - per nephrology runs high normally and had a short run at Friday's session -This raises the question of whether her symptoms that developed post-HD may improve with additional HD - which will be arranged urgently today -Nephrology prn order set utilized -Nephrology is consulting -Consider Cherie Ouch -She was given calcium gluconate, Novolog -Will observe for now in progressive care  Weakness, rash - concerning for vasculitis  -Patient with subacute onset of migrating polyarthralgias  and vasculitic-appearing rash (painful to palpation and palpable) -Inflammatory markers are mildly elevated -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 The patient has been fully vaccinated against COVID-19.   Level of care: Progressive DVT prophylaxis: Eliquis Code Status: DNR - confirmed with patient Family Communication: None present Disposition Plan:  The patient is from: home  Anticipated d/c is to: home without [facility] services   Anticipated d/c date will depend on clinical response to treatment, but possibly as early as tomorrow if she has excellent response to treatment  Patient is currently: acutely ill Consults called: Nephrology; vascular surgery; PT/OT, nutrition Admission status:  It is my clinical opinion that referral for OBSERVATION is reasonable and necessary in this patient based on the above information provided. The aforementioned taken together are felt to place the patient at high risk for further clinical deterioration. However it is anticipated that the patient may be medically stable for discharge from the hospital within 24 to 48 hours.    Karmen Bongo MD [facility]   How to contact the [facility] Attending or Consulting provider [place] or covering provider during after hours [place], for this patient?  1. Check the care team in [facility] and look for a) attending/consulting [facility] provider listed and b) the [facility] team listed 2. Log into [email or web address] and use [place]'s universal password to access. If you do not have the password, please contact the hospital operator. 3. Locate the [facility] provider you are looking for under [facility] and page to a number that you can be directly reached. 4. If you still have difficulty reaching the provider, please page the [facility] (Director on Call) for the Hospitalists listed on amion for assistance.   05/28/2021, 2:34 PM

## 2021-05-28 NOTE — Consult Note (Signed)
[facility] Renal Consultation Note    Indication for Consultation:  Management of ESRD/hemodialysis, anemia, hypertension/volume, and secondary hyperparathyroidism.  HPI: Whitney Walker is a 65 y.o. female with PMH including ESRD on dialysis MWF, CHF, hx colon cancer, COPD, CAD, DVT, PE, and PVD who presented to the ED with leg rash, pain and weakness. Pt reports she first noticed two small red spots on her leg after HD on Friday. Her legs became progressively weaker over the weekend to the point that she was having trouble standing on her own. Also reports pain in her L scapula region.  She did not notice the extent of the "rash" on her legs until she came to the ED today. She also reports chills over the weekend. Reports she was SOB last night but it improved. No current chest pain, palpitations, dizziness, SOB, orthopnea, or abdominal pain. She has had two episodes of sudden onset nausea since she arrived at the ED and has had two episodes of emesis. Labs notable for K+ 6.9, BUN 69, Cr 12.78, Alb 2.7, WBC 19.2, Hgb 11.6, Plt 216. EKG with mildly peaked T waves. CXR showed small stable effusion but otherwise no active cardiopulmonary disease. Temporizing measures for hyperkalemia given in the ED (calcium gluconate, insulin and dextrose). Nephrology consulted for hyperkalemia/management of ESRD. Pt did shorten dialysis slightly on Friday, but is otherwise complaint with HD. Her potassium tends to run high outpatient (high 5-mid 6's).   Past Medical History:  Diagnosis Date  . Anemia   . Anxiety   . CHF (congestive heart failure) ([place])   . Colon cancer [facility])    treatment surgery  . Complication of anesthesia    after first C- [place] "couldnt walk after", patient denies having a spinal  . COPD (chronic obstructive pulmonary disease) ([place])   . Coronary artery disease   . Depression   . DVT (deep venous thrombosis) ([place])   . ESRD (end stage renal disease) ([place])    Hemo: MWF   . History of blood transfusion 04/2018  . Hypertension    07/07/18- no longer takes BP medications  . Meningitis   . Pain in limb 07/30/2013  . PE (pulmonary embolism)   . Peripheral vascular disease ([place])   . Restless legs   . Shortness of breath    with exertion  . Sleep apnea   . SOB (shortness of breath) 03/03/2019  . Vertigo    Past Surgical History:  Procedure Laterality Date  . ABDOMINAL AORTAGRAM N/A 07/26/2013   Procedure: ABDOMINAL Maxcine Ham;  Surgeon: [name] [place], MD;  Location: [facility] CATH LAB;  Service: Cardiovascular;  Laterality: N/A;  . ABDOMINAL HYSTERECTOMY    . AV FISTULA PLACEMENT Left 05/05/2018   Procedure: ARTERIOVENOUS (AV) FISTULA CREATION BRACHIOCEPHALIC;  Surgeon: Waynetta Sandy, MD;  Location: [place];  Service: Vascular;  Laterality: Left;  . AV FISTULA PLACEMENT Left 07/16/2018   Procedure: ARTERIOVENOUS FISTULA CREATION;  Surgeon: Waynetta Sandy, MD;  Location: [place];  Service: Vascular;  Laterality: Left;  . BASCILIC VEIN TRANSPOSITION Left 09/03/2018   Procedure: BASILIC VEIN TRANSPOSITION SECOND STAGE;  Surgeon: Waynetta Sandy, MD;  Location: [place];  Service: Vascular;  Laterality: Left;  . BREAST BIOPSY Left   . CESAREAN SECTION     X 3 [id number]  . CHOLECYSTECTOMY    . CHOLECYSTECTOMY  1980/s  . COLECTOMY  2010  . DIVERTICULOSIS SURGERY-2002  2012  . FEMORAL-POPLITEAL BYPASS GRAFT Left 08/13/2013   Procedure: BYPASS GRAFT  FEMORAL-POPLITEAL ARTERY WITH NON-REVERSED SAPHANEOUS VEIN; ULTRASOUND GUIDED;  Surgeon: Mal [name], MD;  Location: [place];  Service: Vascular;  Laterality: Left;  . INSERTION OF DIALYSIS CATHETER Right 04/30/2018   Procedure: INSERTION OF Right Internal Jugular DIALYSIS CATHETER;  Surgeon: [name], MD;  Location: [place];  Service: Vascular;  Laterality: Right;  . IR FLUORO GUIDE CV LINE RIGHT  07/24/2020  . IR REMOVAL TUN CV CATH W/O FL  07/21/2020  . IR US GUIDE VASC ACCESS RIGHT  07/24/2020  . LEFT  HEART CATH AND CORONARY ANGIOGRAPHY N/A 05/01/2021   Procedure: LEFT HEART CATH AND CORONARY ANGIOGRAPHY;  Surgeon: [name], MD;  Location: [facility] CV LAB;  Service: Cardiovascular;  Laterality: N/A;  . LOWER EXTREMITY ANGIOGRAM Left 07/26/2013   Procedure: LOWER EXTREMITY ANGIOGRAM;  Surgeon: [name] Philip, MD;  Location: [facility] CATH LAB;  Service: Cardiovascular;  Laterality: Left;   Family History  Problem Relation Age of Onset  . Cancer Mother 42       breast and bone  . Cancer Father 47       prostate  . Hypertension Sister   . Bleeding Disorder Sister   . Cancer Cousin 20       breast cancer   . Hypertension Daughter   . Breast cancer Neg Hx    Social History:  reports that she quit smoking about 3 months ago. Her smoking use included cigarettes. She has a 25.00 pack-year smoking history. She has quit using smokeless tobacco. She reports current drug use. Drug: Oxycodone. She reports that she does not drink alcohol.  ROS: As per HPI otherwise negative.  Physical Exam: Vitals:   05/28/21 0759 05/28/21 0809 05/28/21 0926 05/28/21 1000  BP:  (!) 152/78 112/63 136/71  Pulse:  84 88 82  Resp:  18 16 15   Temp:  98.7 F (37.1 C)    TempSrc:  Oral    SpO2: 98% 99% 95% 97%     General: Well developed female, slightly diaphoretic during episode of emesis, alert and oriented x3 Head: Normocephalic, atraumatic, sclera non-icteric, mucus membranes are moist. Neck: VD not elevated. Lungs: Clear bilaterally to auscultation without wheezes, rales, or rhonchi. Breathing is unlabored on RA Heart: RRR with normal S1, S2. No murmurs, rubs, or gallops appreciated. Abdomen: Soft, non-tender, non-distended with normoactive bowel sounds. No rebound/guarding. No obvious abdominal masses. Episode of emesis during exam Musculoskeletal:  Strength and tone appear normal for age. Lower extremities: No edema bilateral lower extremities. Diffuse scattered circular areas of erythema b/l lower  extremities, very painful to light touch  Neuro: Alert and oriented X 3. Moves all extremities spontaneously. Psych:  Responds to questions appropriately with a normal affect. Dialysis Access: R IJ TDC with clean, dry bandage  Allergies  Allergen Reactions  . Carnosine     Other reaction(s): Unknown  . Gadolinium Derivatives Hives and Other (See Comments)    HIVES, Desc: HIVES W/ "DYE" USED FOR 1ST CT SCAN BUT NOT 2ND, NO PREMEDS USED, PT UNCERTAIN OF CIRCUMSTANCES,,?POSSIBLE MRI CONTRAST ALLERGY, ALL STUDIES DONE "SOMEWHERE" IN [place]//A.C., Onset Date: [id number]  . Iohexol Other (See Comments)     Code: HIVES, Desc: HIVES W/ "DYE" USED FOR 1ST CT SCAN BUT NOT 2ND, NO PREMEDS USED, PT UNCERTAIN OF CIRCUMSTANCES,,?POSSIBLE MRI CONTRAST ALLERGY, ALL STUDIES DONE "SOMEWHERE" IN [place]//A.C., Onset Date: [id number]   . Iodine Hives  . Naltrexone     Other reaction(s): Unknown   Prior to Admission medications  Medication Sig Start Date End Date Taking? Authorizing Provider  acetaminophen (TYLENOL) 500 MG tablet Take 1,000 mg by mouth 2 (two) times daily as needed for moderate pain or headache.   Yes [provider]  albuterol (VENTOLIN HFA) 108 (90 Base) MCG/ACT inhaler Inhale 2 puffs into the lungs every 6 (six) hours as needed for wheezing or shortness of breath. 08/03/20  Yes [name], FNP  amLODipine (NORVASC) 5 MG tablet Take 1 tablet (5 mg total) by mouth daily. 09/28/20 09/28/21 Yes [name], FNP  apixaban (ELIQUIS) 2.5 MG TABS tablet Take 1 tablet (2.5 mg total) by mouth 2 (two) times daily. 05/03/21  Yes [name], MD  Ascorbic Acid (VITAMIN C) 100 MG tablet Take 100 mg by mouth daily.   Yes [provider]  atorvastatin (LIPITOR) 10 MG tablet Take 1 tablet (10 mg total) by mouth daily. 03/01/21  Yes Cantwell, Celeste C, PA-C  AURYXIA 1 GM 210 MG(Fe) tablet Take 420-840 mg by mouth with breakfast, with lunch, and with evening meal. Taking  2 tablets daily  (420mg ) with each snack 05/15/21  Yes [provider]  Biotin 10000 MCG TABS Take 10,000 mcg by mouth daily.    Yes [provider]  carvedilol (COREG) 12.5 MG tablet Take 12.5 mg by mouth 2 (two) times daily with a meal.    Yes [provider]  ibuprofen (ADVIL) 200 MG tablet Take 200-400 mg by mouth every 6 (six) hours as needed for mild pain.   Yes [provider]  meclizine (ANTIVERT) 12.5 MG tablet Take 1 tablet (12.5 mg total) by mouth 3 (three) times daily as needed for dizziness. 04/12/21  Yes Glendale Chard, MD  multivitamin (RENA-VIT) TABS tablet Take 1 tablet by mouth at bedtime.   Yes Edrick Oh, MD  mupirocin ointment (BACTROBAN) 2 % Apply 1 application topically daily as needed (skin bumps). 04/15/19  Yes [name], FNP  nystatin (NYSTATIN) powder Apply 1 application topically 4 (four) times daily as needed. Patient taking differently: Apply 1 application topically 4 (four) times daily as needed (irriation). 08/03/20  Yes [name], FNP  omeprazole (PRILOSEC) 20 MG capsule Take 1 capsule (20 mg total) by mouth daily. 05/17/21  Yes Truitt Merle, MD  vitamin E 200 UNIT capsule Take 200 Units by mouth daily.   Yes [provider]  lanthanum (FOSRENOL) 1000 MG chewable tablet Chew 1,000 mg by mouth See admin instructions. Take 1000 mg with each meal and each snack    [provider]  oxyCODONE-acetaminophen (PERCOCET) 5-325 MG tablet Take 2 tablets by mouth every 8 (eight) hours as needed for severe pain. Patient not taking: Reported on 05/28/2021 02/06/21   Mesner, Corene Cornea, MD  Vitamin D, Ergocalciferol, (DRISDOL) 1.25 MG (50000 UNIT) CAPS capsule Take 1 capsule (50,000 Units total) by mouth every 7 (seven) days. Patient not taking: Reported on 05/28/2021 04/12/21   [name], FNP   Current Facility-Administered Medications  Medication Dose Route Frequency Provider Last Rate Last Admin  . Chlorhexidine Gluconate Cloth 2 % PADS 6 each   6 each Topical Q0600 Janalee Dane, PA-C       Current Outpatient Medications  Medication Sig Dispense Refill  . acetaminophen (TYLENOL) 500 MG tablet Take 1,000 mg by mouth 2 (two) times daily as needed for moderate pain or headache.    . albuterol (VENTOLIN HFA) 108 (90 Base) MCG/ACT inhaler Inhale 2 puffs into the lungs every 6 (six) hours as needed for wheezing or shortness  of breath. 18 g 0  . amLODipine (NORVASC) 5 MG tablet Take 1 tablet (5 mg total) by mouth daily. 90 tablet 1  . apixaban (ELIQUIS) 2.5 MG TABS tablet Take 1 tablet (2.5 mg total) by mouth 2 (two) times daily. 180 tablet 3  . Ascorbic Acid (VITAMIN C) 100 MG tablet Take 100 mg by mouth daily.    [name] atorvastatin (LIPITOR) 10 MG tablet Take 1 tablet (10 mg total) by mouth daily. 90 tablet 3  . AURYXIA 1 GM 210 MG(Fe) tablet Take 420-840 mg by mouth with breakfast, with lunch, and with evening meal. Taking  2 tablets daily (420mg ) with each snack    . Biotin 10000 MCG TABS Take 10,000 mcg by mouth daily.     . carvedilol (COREG) 12.5 MG tablet Take 12.5 mg by mouth 2 (two) times daily with a meal.     . ibuprofen (ADVIL) 200 MG tablet Take 200-400 mg by mouth every 6 (six) hours as needed for mild pain.    . meclizine (ANTIVERT) 12.5 MG tablet Take 1 tablet (12.5 mg total) by mouth 3 (three) times daily as needed for dizziness. 30 tablet 0  . multivitamin (RENA-VIT) TABS tablet Take 1 tablet by mouth at bedtime.    . mupirocin ointment (BACTROBAN) 2 % Apply 1 application topically daily as needed (skin bumps). 22 g 2  . nystatin (NYSTATIN) powder Apply 1 application topically 4 (four) times daily as needed. (Patient taking differently: Apply 1 application topically 4 (four) times daily as needed (irriation).) 30 g 1  . omeprazole (PRILOSEC) 20 MG capsule Take 1 capsule (20 mg total) by mouth daily. 30 capsule 1  . vitamin E 200 UNIT capsule Take 200 Units by mouth daily.    [name] lanthanum (FOSRENOL) 1000 MG chewable tablet  Chew 1,000 mg by mouth See admin instructions. Take 1000 mg with each meal and each snack    . oxyCODONE-acetaminophen (PERCOCET) 5-325 MG tablet Take 2 tablets by mouth every 8 (eight) hours as needed for severe pain. (Patient not taking: Reported on 05/28/2021) 15 tablet 0  . Vitamin D, Ergocalciferol, (DRISDOL) 1.25 MG (50000 UNIT) CAPS capsule Take 1 capsule (50,000 Units total) by mouth every 7 (seven) days. (Patient not taking: Reported on 05/28/2021) 12 capsule 1   Labs: Basic Metabolic Panel: Recent Labs  Lab 05/28/21 0842  NA 130*  K 6.9*  CL 98  CO2 15*  GLUCOSE 79  BUN 69*  CREATININE 12.78*  CALCIUM 9.1   Liver Function Tests: Recent Labs  Lab 05/28/21 0842  AST 20  ALT 16  ALKPHOS 111  BILITOT 0.3  PROT 7.2  ALBUMIN 2.7*   CBC: Recent Labs  Lab 05/28/21 0842  WBC 19.2*  NEUTROABS 16.9*  HGB 11.6*  HCT 37.3  MCV 93.7  PLT 216   Studies/Results: DG Chest Port 1 View  Result Date: 05/28/2021 CLINICAL DATA:  Cough. Bilateral leg pain and rash beginning 3 days ago after dialysis. EXAM: PORTABLE CHEST 1 VIEW COMPARISON:  Chest CT, 02/27/2021. Chest radiographs, 02/05/2021 and older studies. FINDINGS: Cardiac silhouette normal in size.  No mediastinal or hilar masses. Opacity at the left lung base consistent with atelectasis likely with a small effusion as noted on the prior chest CT. Remainder of the lungs is clear. No pneumothorax. Right internal jugular dual-lumen tunneled central venous catheter is stable. Skeletal structures are grossly intact. IMPRESSION: No acute cardiopulmonary disease. Electronically Signed   By: Lajean Manes M.D.   On:  05/28/2021 09:04    Dialysis Orders:  Center: [facility] on MWF. 180NRe, 3.5hours, BFR 400, DFR 500, 2K/2Ca, UF profile 2, TDC, EDW 90kg Heparin 6000 unit bolus No VDRA/ESA Binder: Auryxia 3 tabs PO TID with meals  Assessment/Plan: 1. Lower extremity rash/ weakness: Scattered erythematous lesions  on both lower extremities with associated weakness and elevated WBC count. Hyperkalemia may account for some LE weakness, however her potassium is chronically elevated and she is typically asymptomatic. ? vasculitis. Management per admitting team.  2. Hyperkalemia: K+ runs high outpatient, exacerbated by short HD on Friday. Temporizing measures given in the ED (calcium gluconate, insulin and dextrose). Planned for dialysis today, next available seat, using low potassium bath for part of treatment.   3. Emesis: Possibly related to dose of narcotics, anti-emetic medications ordered 4.  ESRD:  Dialyzes MWF schedule, received abbreviated HD on Friday. Planned for urgent HD today as above.  5.  Hypertension/volume: BP controlled, does not appear volume overloaded on exam. Minimal UF with HD today.  6.  Anemia: Hgb 11.6, no ESA indicated at this time.  7.  Metabolic bone disease: Corrected calcium elevated. Not on VDRA outpatient. Continue low calcium bath with HD. Resume phosphorus binders once tolerating PO intake.  8.  Nutrition:  Renal diet (low K+, low phos) very important due to tendency towards hyperkalemia. Will need protein supplementation once tolerating PO.   Anice Paganini, PA-C 05/28/2021, 11:37 AM  [facility] Pager: [phone]

## 2021-05-28 NOTE — Progress Notes (Signed)
Patient received from ED from [place], [place]. Patient oriented to room CHG given, placed on telemetry, obtained inpatient orders, VS, bed within lowest position, call bell within reach.  Report called to [facility], RN in dialysis for dialysis this afternoon.

## 2021-05-28 NOTE — ED Triage Notes (Signed)
Pt arrived by EMS from home complaining of bilateral leg pain and rash that started Friday after dialysis  Pain and rash started on left foot and has spread up her legs and now onto her left shoulder area  States she is unable to bear weight due to pain.  Rash does not itch and pt denies feeling like her tongue is swelling and her throat is not closing. Pt states around May 19th she started a new medication for GERD. Denies any other changes to lotions/soaps/medications

## 2021-05-28 NOTE — ED Notes (Signed)
One large emesis, pt states she was feeling fine then suddenly vomited and denies Nausea after vomiting

## 2021-05-28 NOTE — Progress Notes (Signed)
   05/28/21 1940  Assess: MEWS Score  Temp (!) 101.2 F (38.4 C)  BP 104/69  Pulse Rate (!) 129  ECG Heart Rate (!) 129  Resp 18  Level of Consciousness Alert  SpO2 93 %  O2 Device Room Air  Assess: MEWS Score  MEWS Temp 1  MEWS Systolic 0  MEWS Pulse 2  MEWS RR 0  MEWS LOC 0  MEWS Score 3  MEWS Score Color Yellow  Assess: if the MEWS score is Yellow or Red  Were vital signs taken at a resting state? Yes  Focused Assessment No change from prior assessment  Early Detection of Sepsis Score *See Row Information* Medium  MEWS guidelines implemented *See Row Information* Yes  Treat  MEWS Interventions Administered scheduled meds/treatments  Take Vital Signs  Increase Vital Sign Frequency  Yellow: Q 2hr X 2 then Q 4hr X 2, if remains yellow, continue Q 4hrs  Escalate  MEWS: Escalate Yellow: discuss with charge nurse/RN and consider discussing with provider and RRT  Notify: Charge Nurse/RN  Name of Charge Nurse/RN Notified TIm RN  Date Charge Nurse/RN Notified 05/28/21  Time Charge Nurse/RN Notified 1946  Notify: Provider  Provider Name/Title Argie Ramming  Date Provider Notified 05/28/21  Time Provider Notified 1948  Notification Type Page  Notification Reason Change in status  Document  Progress note created (see row info) Yes   MD responded with orders for 500 cc normal saline bolus. Will continue to monitor vitals per yellow mews protocol.

## 2021-05-28 NOTE — ED Notes (Signed)
Pt starting to feel nauseous again and starting to gag and vomit Provider aware Reglan ordered and give

## 2021-05-28 NOTE — ED Provider Notes (Signed)
[facility] EMERGENCY DEPARTMENT Provider Note   CSN: [id number] Arrival date & time: 05/28/21  0750     History Chief Complaint  Patient presents with  . Leg Pain  . Rash    Whitney Walker is a 65 y.o. female with a past medical history of CHF, CAD, history of DVT/PE on chronic anticoagulation with Eliquis, COPD peripheral vascular disease and end-stage renal disease on hemodialysis Monday Wednesday and Friday last full dialysis 3 days ago.  She is due today.  The patient states that after she left dialysis on Friday of last week she began noticing pain in her foot and knee.  This became severe and by the time she woke up the next day the joints in her right leg were also hurting.  Throughout the day she developed swelling and pain in the joints of her left fingers with difficulty forming a fist or moving the fingers along with pain in the left shoulder and now severe pain in the joints of her right thumb and ring and pinky finger.  She has had associated chills without fever and has had an associated diffuse rash which began in her feet and legs and has progressed upward now coating her arm.  She states that the rash is painful, it is not pruritic. She states that he Skin is painful touch. She is only taking a new reflux medication which she started on May 19 but has no other changes in medications, lotions, soaps or detergents.  She denies any known tick bites.  She denies any history of autoimmune disease. HPI     Past Medical History:  Diagnosis Date  . Anemia   . Anxiety   . CHF (congestive heart failure) ([place])   . Colon cancer [facility])    treatment surgery  . Complication of anesthesia    after first C- [place] "couldnt walk after", patient denies having a spinal  . COPD (chronic obstructive pulmonary disease) ([place])   . Coronary artery disease   . Depression   . DVT (deep venous thrombosis) ([place])   . ESRD (end stage renal disease) ([place])    Hemo: MWF  .  History of blood transfusion 04/2018  . Hypertension    07/07/18- no longer takes BP medications  . Meningitis   . Pain in limb 07/30/2013  . PE (pulmonary embolism)   . Peripheral vascular disease ([place])   . Restless legs   . Shortness of breath    with exertion  . Sleep apnea   . SOB (shortness of breath) 03/03/2019  . Vertigo     Patient Active Problem List   Diagnosis Date Noted  . Hyperkalemia, diminished renal excretion 05/28/2021  . Generalized weakness 05/28/2021  . Rash and nonspecific skin eruption 05/28/2021  . Atypical chest pain   . Coronary artery calcification seen on CAT scan   . Hyperkalemia 05/09/2020  . Left leg pain 05/09/2020  . Restless leg syndrome 09/19/2018  . OSA (obstructive sleep apnea) 04/29/2018  . ESRD on dialysis ([place]) 04/29/2018  . Dyspnea on exertion 03/12/2017  . Morbid (severe) obesity due to excess calories ([place]) 03/12/2017  . Angina pectoris ([place]) 03/02/2017  . Anemia of chronic kidney failure, stage 3 (moderate) ([place]) 12/10/2016  . History of colon cancer 08/12/2016  . Mass of left breast on mammogram 12/10/2015  . Depression 10/18/2015  . Numbness and tingling of left arm and leg 01/24/2015  . PVD (peripheral vascular disease) with claudication ([place]) 03/23/2014  .  PAD (peripheral artery disease) ([place]) 12/14/2013  . Chronic diastolic heart failure ([place]) 11/15/2013  . Essential hypertension 11/15/2013  . Leucocytosis 04/28/2012    Past Surgical History:  Procedure Laterality Date  . ABDOMINAL AORTAGRAM N/A 07/26/2013   Procedure: ABDOMINAL Maxcine Ham;  Surgeon: [name] Linton Hall, MD;  Location[facility] C[facility] LAB;  Service: Cardiovascular;  Laterality: N/A;  . ABDOMINAL HYSTERECTOMY    . AV FISTULA PLACEMENT Left 05/05/2018   Procedure: ARTERIOVENOUS (AV) FISTULA CREATION BRACHIOCEPHALIC;  Surgeon: Waynetta Sandy, MD;  Location: [facility];  Service: Vascular;  Laterality: Left;  . AV FISTULA PLACEMENT Left 07/16/2018   Procedure: ARTERIOVENOUS  FISTULA CREATION;  Surgeon: Waynetta Sandy, MD;  Location: [facility];  Service: Vascular;  Laterality: Left;  . BASCILIC VEIN TRANSPOSITION Left 09/03/2018   Procedure: BASILIC VEIN TRANSPOSITION SECOND STAGE;  Surgeon: Waynetta Sandy, MD;  Location: [facility];  Service: Vascular;  Laterality: Left;  . BREAST BIOPSY Left   . CESAREAN SECTION     X 3 [id number]  . CHOLECYSTECTOMY    . CHOLECYSTECTOMY  1980/s  . COLECTOMY  2010  . DIVERTICULOSIS SURGERY-2002  2012  . FEMORAL-POPLITEAL BYPASS GRAFT Left 08/13/2013   Procedure: BYPASS GRAFT FEMORAL-POPLITEAL ARTERY WITH NON-REVERSED SAPHANEOUS VEIN; ULTRASOUND GUIDED;  Surgeon: Mal [name], MD;  Location: [facility];  Service: Vascular;  Laterality: Left;  . INSERTION OF DIALYSIS CATHETER Right 04/30/2018   Procedure: INSERTION OF Right Internal Jugular DIALYSIS CATHETER;  Surgeon: [name], MD;  Location: [place];  Service: Vascular;  Laterality: Right;  . IR FLUORO GUIDE CV LINE RIGHT  07/24/2020  . IR REMOVAL TUN CV CATH W/O FL  07/21/2020  . IR US GUIDE VASC ACCESS RIGHT  07/24/2020  . LEFT HEART CATH AND CORONARY ANGIOGRAPHY N/A 05/01/2021   Procedure: LEFT HEART CATH AND CORONARY ANGIOGRAPHY;  Surgeon: [name], MD;  Location: [facility] CV LAB;  Service: Cardiovascular;  Laterality: N/A;  . LOWER EXTREMITY ANGIOGRAM Left 07/26/2013   Procedure: LOWER EXTREMITY ANGIOGRAM;  Surgeon: [name] Redfield, MD;  Location: [facility] CATH LAB;  Service: Cardiovascular;  Laterality: Left;     OB History   No obstetric history on file.     Family History  Problem Relation Age of Onset  . Cancer Mother 53       breast and bone  . Cancer Father 41       prostate  . Hypertension Sister   . Bleeding Disorder Sister   . Cancer Cousin 20       breast cancer   . Hypertension Daughter   . Breast cancer Neg Hx     Social History   Tobacco Use  . Smoking status: Current Every Day Smoker    Packs/day: 0.50    Years: 50.00    Pack years:  25.00    Types: Cigarettes    Last attempt to quit: 02/03/2021    Years since quitting: 0.3  . Smokeless tobacco: Former Network engineer  . Vaping Use: Never used  Substance Use Topics  . Alcohol use: No    Alcohol/week: 0.0 standard drinks    Comment: she used to drink alcohol, quit in 2010; h/o heavy use   . Drug use: Yes    Types: Oxycodone    Home Medications Prior to Admission medications   Medication Sig Start Date End Date Taking? Authorizing Provider  acetaminophen (TYLENOL) 500 MG tablet Take 1,000 mg by mouth 2 (two) times daily as needed  for moderate pain or headache.   Yes [provider]  albuterol (VENTOLIN HFA) 108 (90 Base) MCG/ACT inhaler Inhale 2 puffs into the lungs every 6 (six) hours as needed for wheezing or shortness of breath. 08/03/20  Yes [name], FNP  amLODipine (NORVASC) 5 MG tablet Take 1 tablet (5 mg total) by mouth daily. 09/28/20 09/28/21 Yes [name], FNP  apixaban (ELIQUIS) 2.5 MG TABS tablet Take 1 tablet (2.5 mg total) by mouth 2 (two) times daily. 05/03/21  Yes [name], MD  Ascorbic Acid (VITAMIN C) 100 MG tablet Take 100 mg by mouth daily.   Yes [provider]  atorvastatin (LIPITOR) 10 MG tablet Take 1 tablet (10 mg total) by mouth daily. 03/01/21  Yes Cantwell, Celeste C, PA-C  AURYXIA 1 GM 210 MG(Fe) tablet Take 420-840 mg by mouth with breakfast, with lunch, and with evening meal. Taking  2 tablets daily (420mg ) with each snack 05/15/21  Yes [provider]  Biotin 10000 MCG TABS Take 10,000 mcg by mouth daily.    Yes [provider]  carvedilol (COREG) 12.5 MG tablet Take 12.5 mg by mouth 2 (two) times daily with a meal.    Yes [provider]  ibuprofen (ADVIL) 200 MG tablet Take 200-400 mg by mouth every 6 (six) hours as needed for mild pain.   Yes [provider]  meclizine (ANTIVERT) 12.5 MG tablet Take 1 tablet (12.5 mg total) by mouth 3 (three) times daily as needed for dizziness.  04/12/21  Yes Glendale Chard, MD  multivitamin (RENA-VIT) TABS tablet Take 1 tablet by mouth at bedtime.   Yes Edrick Oh, MD  mupirocin ointment (BACTROBAN) 2 % Apply 1 application topically daily as needed (skin bumps). 04/15/19  Yes [name], FNP  nystatin (NYSTATIN) powder Apply 1 application topically 4 (four) times daily as needed. Patient taking differently: Apply 1 application topically 4 (four) times daily as needed (irriation). 08/03/20  Yes [name], FNP  omeprazole (PRILOSEC) 20 MG capsule Take 1 capsule (20 mg total) by mouth daily. 05/17/21  Yes Truitt Merle, MD  vitamin E 200 UNIT capsule Take 200 Units by mouth daily.   Yes [provider]  lanthanum (FOSRENOL) 1000 MG chewable tablet Chew 1,000 mg by mouth See admin instructions. Take 1000 mg with each meal and each snack    [provider]    Allergies    Carnosine, Gadolinium derivatives, Iohexol, Iodine, and Naltrexone  Review of Systems   Review of Systems Ten systems reviewed and are negative for acute change, except as noted in the HPI.   Physical Exam Updated Vital Signs BP (!) 147/87   Pulse 94   Temp 98.4 F (36.9 C) (Oral)   Resp 16   Wt 92.7 kg   SpO2 96%   BMI 38.61 kg/m   Physical Exam Vitals and nursing note reviewed.  Constitutional:      General: She is not in acute distress.    Appearance: She is well-developed. She is not diaphoretic.  HENT:     Head: Normocephalic and atraumatic.  Eyes:     General: No scleral icterus.    Conjunctiva/sclera: Conjunctivae normal.  Cardiovascular:     Rate and Rhythm: Normal rate and regular rhythm.     Heart sounds: Normal heart sounds. No murmur heard. No friction rub. No gallop.   Pulmonary:     Effort: Pulmonary effort is normal. No respiratory distress.     Breath sounds: Normal breath sounds.  Abdominal:     General: Bowel sounds are normal. There is no distension.     Palpations: Abdomen is soft. There is no mass.      Tenderness: There is no abdominal tenderness. There is no guarding.  Musculoskeletal:     Cervical back: Normal range of motion.     Comments: No obvious swelling of the joints.  [name] joints are stiff and very painful for her to move.  Skin:    General: Skin is warm and dry.     Findings: Rash present.     Comments: Fuhs, tender, erythematous, nodular rash over bilateral legs posterior right arm and back.  There are small areas of petechial eruption at the ankles.  No mucosal involvement.  Neurological:     Mental Status: She is alert and oriented to person, place, and time.  Psychiatric:        Behavior: Behavior normal.         ED Results / Procedures / Treatments   Labs (all labs ordered are listed, but only abnormal results are displayed) Labs Reviewed  CBC WITH DIFFERENTIAL/PLATELET - Abnormal; Notable for the following components:      Result Value   WBC 19.2 (*)    Hemoglobin 11.6 (*)    RDW 16.5 (*)    Neutro Abs 16.9 (*)    Abs Immature Granulocytes 0.11 (*)    All other components within normal limits  COMPREHENSIVE METABOLIC PANEL - Abnormal; Notable for the following components:   Sodium 130 (*)    Potassium 6.9 (*)    CO2 15 (*)    BUN 69 (*)    Creatinine, Ser 12.78 (*)    Albumin 2.7 (*)    GFR, Estimated 3 (*)    Anion gap 17 (*)    All other components within normal limits  SEDIMENTATION RATE - Abnormal; Notable for the following components:   Sed Rate 90 (*)    All other components within normal limits  C-REACTIVE PROTEIN - Abnormal; Notable for the following components:   CRP 16.6 (*)    All other components within normal limits  RESP PANEL BY RT-PCR (FLU A&B, COVID) ARPGX2  PROTIME-INR  MAGNESIUM  HIV ANTIBODY (ROUTINE TESTING W REFLEX)    EKG EKG Interpretation  Date/Time:  Monday May 28 2021 08:04:51 EDT Ventricular Rate:  85 PR Interval:  200 QRS Duration: 76 QT Interval:  366 QTC Calculation: 436 R Axis:   -45 Text  Interpretation: Sinus rhythm Left anterior fascicular block mildly peaked T waves Confirmed by Sherwood Gambler [phone]) on 05/28/2021 8:24:53 AM   Radiology DG Chest Port 1 View  Result Date: 05/28/2021 CLINICAL DATA:  Cough. Bilateral leg pain and rash beginning 3 days ago after dialysis. EXAM: PORTABLE CHEST 1 VIEW COMPARISON:  Chest CT, 02/27/2021. Chest radiographs, 02/05/2021 and older studies. FINDINGS: Cardiac silhouette normal in size.  No mediastinal or hilar masses. Opacity at the left lung base consistent with atelectasis likely with a small effusion as noted on the prior chest CT. Remainder of the lungs is clear. No pneumothorax. Right internal jugular dual-lumen tunneled central venous catheter is stable. Skeletal structures are grossly intact. IMPRESSION: No acute cardiopulmonary disease. Electronically Signed   By: Lajean Manes M.D.   On: 05/28/2021 09:04    Procedures .Critical Care Performed by: Margarita Mail, PA-C Authorized by: Margarita Mail, PA-C   Critical care provider statement:    Critical care time (minutes):  45   Critical care time was  exclusive of:  Separately billable procedures and treating other patients   Critical care was necessary to treat or prevent imminent or life-threatening deterioration of the following conditions:  Metabolic crisis   Critical care was time spent personally by me on the following activities:  Discussions with consultants, evaluation of patient's response to treatment, examination of patient, ordering and performing treatments and interventions, ordering and review of laboratory studies, ordering and review of radiographic studies, pulse oximetry, re-evaluation of patient's condition, obtaining history from patient or surrogate and review of old charts     Medications Ordered in ED Medications  Chlorhexidine Gluconate Cloth 2 % PADS 6 each (6 each Topical Given 05/28/21 1340)  amLODipine (NORVASC) tablet 5 mg (has no administration in  time range)  atorvastatin (LIPITOR) tablet 10 mg (has no administration in time range)  carvedilol (COREG) tablet 12.5 mg (has no administration in time range)  ferric citrate (AURYXIA) tablet 420 mg (has no administration in time range)  pantoprazole (PROTONIX) EC tablet 40 mg (has no administration in time range)  apixaban (ELIQUIS) tablet 2.5 mg (has no administration in time range)  multivitamin (RENA-VIT) tablet 1 tablet (has no administration in time range)  acetaminophen (TYLENOL) tablet 650 mg (has no administration in time range)    Or  acetaminophen (TYLENOL) suppository 650 mg (has no administration in time range)  zolpidem (AMBIEN) tablet 5 mg (has no administration in time range)  sorbitol 70 % solution 30 mL (has no administration in time range)  docusate sodium (ENEMEEZ) enema 283 mg (has no administration in time range)  ondansetron (ZOFRAN) tablet 4 mg (has no administration in time range)    Or  ondansetron (ZOFRAN) injection 4 mg (has no administration in time range)  camphor-menthol (SARNA) lotion 1 application (has no administration in time range)    And  hydrOXYzine (ATARAX/VISTARIL) tablet 25 mg (has no administration in time range)  calcium carbonate (dosed in mg elemental calcium) suspension 500 mg of elemental calcium (has no administration in time range)  feeding supplement (NEPRO CARB STEADY) liquid 237 mL (has no administration in time range)  sodium chloride flush (NS) 0.9 % injection 3 mL (has no administration in time range)  HYDROcodone-acetaminophen (NORCO/VICODIN) 5-325 MG per tablet 1-2 tablet (has no administration in time range)  docusate sodium (COLACE) capsule 100 mg (has no administration in time range)  polyethylene glycol (MIRALAX / GLYCOLAX) packet 17 g (has no administration in time range)  bisacodyl (DULCOLAX) EC tablet 5 mg (has no administration in time range)  albuterol (PROVENTIL) (2.5 MG/3ML) 0.083% nebulizer solution 2.5 mg (has no  administration in time range)  hydrALAZINE (APRESOLINE) injection 5 mg (has no administration in time range)  HYDROmorphone (DILAUDID) injection 1 mg (1 mg Intravenous Given 05/28/21 0857)  calcium gluconate inj 10% (1 g) URGENT USE ONLY! (1 g Intravenous Given 05/28/21 1110)  insulin aspart (novoLOG) injection 5 Units (5 Units Intravenous Given 05/28/21 1110)    And  dextrose 50 % solution 50 mL (50 mLs Intravenous Given 05/28/21 1110)  metoCLOPramide (REGLAN) injection 5 mg (5 mg Intravenous Given 05/28/21 1131)    ED Course  I have reviewed the triage vital signs and the nursing notes.  Pertinent labs & imaging results that were available during my care of the patient were reviewed by me and considered in my medical decision making (see chart for details).    MDM Rules/Calculators/A&P  65 year old female here with complaint of painful rash.  [name] potassium is markedly elevated on CMP.  She was given temporizing agents with calcium gluconate, insulin and dextrose drip.  Differential diagnosis includes tickborne illness, vasculitis such as polyarteritis nodosa, drug reaction less likely.  I ordered and reviewed labs that included CBC which shows markedly elevated white blood cell count of 19.2.  CMP with marked hyperkalemia, BUN and creatinine consistent with patient's end-stage renal disease.  Patient has pretty significantly elevated sed rate and CRP.  Mag and PT/INR within limits.  COVID panel is negative.  I reviewed patient's one-view portable chest x-ray which shows no acute abnormalities.  EKG shows sinus rhythm at a rate of 85.  Patient admitted to the hospitalist service with Dr. Lorin Mercy.  I also discussed the case with Dr. Jonnie Finner who will set the patient up for dialysis. Final Clinical Impression(s) / ED Diagnoses Final diagnoses:  None    Rx / DC Orders ED Discharge Orders    None       Margarita Mail, PA-C 05/28/21 1626    Sherwood Gambler,  MD 06/02/21 [phone]

## 2021-05-29 ENCOUNTER — Telehealth: Payer: Self-pay

## 2021-05-29 DIAGNOSIS — I10 Essential (primary) hypertension: Secondary | ICD-10-CM | POA: Diagnosis not present

## 2021-05-29 DIAGNOSIS — N186 End stage renal disease: Secondary | ICD-10-CM | POA: Diagnosis not present

## 2021-05-29 DIAGNOSIS — Z992 Dependence on renal dialysis: Secondary | ICD-10-CM | POA: Diagnosis not present

## 2021-05-29 DIAGNOSIS — E875 Hyperkalemia: Secondary | ICD-10-CM

## 2021-05-29 DIAGNOSIS — M130 Polyarthritis, unspecified: Secondary | ICD-10-CM | POA: Diagnosis not present

## 2021-05-29 DIAGNOSIS — R509 Fever, unspecified: Secondary | ICD-10-CM

## 2021-05-29 DIAGNOSIS — R21 Rash and other nonspecific skin eruption: Secondary | ICD-10-CM

## 2021-05-29 DIAGNOSIS — R531 Weakness: Secondary | ICD-10-CM | POA: Diagnosis not present

## 2021-05-29 DIAGNOSIS — I129 Hypertensive chronic kidney disease with stage 1 through stage 4 chronic kidney disease, or unspecified chronic kidney disease: Secondary | ICD-10-CM | POA: Diagnosis not present

## 2021-05-29 DIAGNOSIS — M2550 Pain in unspecified joint: Secondary | ICD-10-CM

## 2021-05-29 DIAGNOSIS — L52 Erythema nodosum: Secondary | ICD-10-CM | POA: Diagnosis not present

## 2021-05-29 LAB — CBC
HCT: 32 % — ABNORMAL LOW (ref 36.0–46.0)
Hemoglobin: 10.3 g/dL — ABNORMAL LOW (ref 12.0–15.0)
MCH: 29.1 pg (ref 26.0–34.0)
MCHC: 32.2 g/dL (ref 30.0–36.0)
MCV: 90.4 fL (ref 80.0–100.0)
Platelets: 176 10*3/uL (ref 150–400)
RBC: 3.54 MIL/uL — ABNORMAL LOW (ref 3.87–5.11)
RDW: 16.3 % — ABNORMAL HIGH (ref 11.5–15.5)
WBC: 12.7 10*3/uL — ABNORMAL HIGH (ref 4.0–10.5)
nRBC: 0 % (ref 0.0–0.2)

## 2021-05-29 LAB — BASIC METABOLIC PANEL
Anion gap: 13 (ref 5–15)
BUN: 27 mg/dL — ABNORMAL HIGH (ref 8–23)
CO2: 23 mmol/L (ref 22–32)
Calcium: 8.6 mg/dL — ABNORMAL LOW (ref 8.9–10.3)
Chloride: 94 mmol/L — ABNORMAL LOW (ref 98–111)
Creatinine, Ser: 7.54 mg/dL — ABNORMAL HIGH (ref 0.44–1.00)
GFR, Estimated: 6 mL/min — ABNORMAL LOW (ref >60)
Glucose, Bld: 126 mg/dL — ABNORMAL HIGH (ref 70–99)
Potassium: 3.9 mmol/L (ref 3.5–5.1)
Sodium: 130 mmol/L — ABNORMAL LOW (ref 135–145)

## 2021-05-29 LAB — C-REACTIVE PROTEIN: CRP: 25.5 mg/dL — ABNORMAL HIGH (ref <1.0)

## 2021-05-29 LAB — HEPATITIS C ANTIBODY: HCV Ab: REACTIVE — AB

## 2021-05-29 LAB — SEDIMENTATION RATE: Sed Rate: 98 mm/hr — ABNORMAL HIGH (ref 0–22)

## 2021-05-29 MED ORDER — IBUPROFEN 200 MG PO TABS
200.0000 mg | ORAL_TABLET | Freq: Three times a day (TID) | ORAL | Status: DC
  Administered 2021-05-30: 200 mg via ORAL
  Filled 2021-05-29 (×6): qty 1

## 2021-05-29 NOTE — Evaluation (Signed)
Physical Therapy Evaluation Patient Details Name: Whitney Walker MRN: [id number] DOB: Jun 12, 1956 Today's Date: 05/29/2021   History of Present Illness  Pt is a 65 y/o female presenting with progressive leg pain and rash, as well as pain leading up to R shoulder/fingers. Workup for hyperkalemia, diminished renal excretion.  PMH: OSA; PVD; ESRD on MWF HD; DVT/PE; CAD; COPD; and colon cancer.    Clinical Impression  Pt admitted with above diagnosis. PTA pt lived at home alone. Independent/modified independent with/without RW depending on weakness/pain. On eval, pt required supervision transfers and min guard assist amb 200' without AD. Pt currently with functional limitations due to the deficits listed below (see PT Problem List). Pt will benefit from skilled PT to increase their independence and safety with mobility to allow discharge home. PT to follow acutely. No follow up services indicated. Pt has all needed DME.       Follow Up Recommendations No PT follow up    Equipment Recommendations  None recommended by PT    Recommendations for Other Services       Precautions / Restrictions Precautions Precautions: Fall Restrictions Weight Bearing Restrictions: No      Mobility  Bed Mobility Overal bed mobility: Needs Assistance Bed Mobility: Supine to Sit     Supine to sit: Min assist;HOB elevated     General bed mobility comments: Pt sitting EOB on arrival.    Transfers Overall transfer level: Needs assistance Equipment used: None Transfers: Sit to/from [facility] Sit to Stand: Supervision Stand pivot transfers: Supervision       General transfer comment: supervision for safety  Ambulation/Gait Ambulation/Gait assistance: Min guard Gait Distance (Feet): 200 Feet Assistive device: None Gait Pattern/deviations: Step-through pattern;Decreased stride length Gait velocity: decreased Gait velocity interpretation: <1.31 ft/sec, indicative of  household ambulator General Gait Details: slow, guarded gait; no LOB noted. 1/4 DOE requiring standing rest break after amb 150'  Stairs            Wheelchair Mobility    Modified Rankin (Stroke Patients Only)       Balance Overall balance assessment: Needs assistance Sitting-balance support: No upper extremity supported;Feet supported Sitting balance-Leahy Scale: Good     Standing balance support: No upper extremity supported;During functional activity Standing balance-Leahy Scale: Fair Standing balance comment: fair static standing at sink without AD, able to mobilize short distance without AD though limitations evident                             Pertinent Vitals/Pain Pain Assessment: Faces Faces Pain Scale: Hurts a little bit Pain Location: B LE in standing (R>L) Pain Descriptors / Indicators: Discomfort Pain Intervention(s): Monitored during session    Home Living Family/patient expects to be discharged to:: Private residence Living Arrangements: Alone Available Help at Discharge: Family;Available PRN/intermittently Type of Home: Apartment Home Access: Stairs to enter Entrance Stairs-Rails: Right;Left Entrance Stairs-Number of Steps: 1 step up in front Home Layout: One level Home Equipment: Clinical cytogeneticist - 2 wheels;Walker - 4 wheels;Grab bars - toilet;Grab bars - tub/shower Additional Comments: states shower seat does not fit in her tub, tub soaks rather than shower due to HD cath    Prior Function Level of Independence: Independent with assistive device(s)         Comments: uses of RW vs no AD use pending weakness. Pt reports difficulties with vertigo causing balance issues. Pt Modified Independent for ADLs/IADLs, uses transportation to go to/from HD.  Reports she has Rollator that needs to be put together     Hand Dominance   Dominant Hand: Right    Extremity/Trunk Assessment   Upper Extremity Assessment Upper Extremity Assessment:  Overall WFL for tasks assessed    Lower Extremity Assessment Lower Extremity Assessment: Generalized weakness    Cervical / Trunk Assessment Cervical / Trunk Assessment: Normal  Communication   Communication: No difficulties  Cognition Arousal/Alertness: Awake/alert Behavior During Therapy: WFL for tasks assessed/performed Overall Cognitive Status: Within Functional Limits for tasks assessed                                        General Comments General comments (skin integrity, edema, etc.): BP 105/56 on L forearm at start of session, pt denies dizziness with activity. Noted NWB on whiteboard in room though no orders present or evidence in notes of WB restrictions - collab with RN    Exercises     Assessment/Plan    PT Assessment Patient needs continued PT services  PT Problem List Decreased strength;Decreased mobility;Decreased activity tolerance;Decreased balance;Pain       PT Treatment Interventions Therapeutic activities;Gait training;Therapeutic exercise;Patient/family education;Stair training;Balance training;Functional mobility training    PT Goals (Current goals can be found in the Care Plan section)  Acute Rehab PT Goals Patient Stated Goal: get help getting my garbage to the road for pick up PT Goal Formulation: With patient Time For Goal Achievement: 06/12/21 Potential to Achieve Goals: Good    Frequency Min 3X/week   Barriers to discharge        Co-evaluation               AM-PAC PT "6 Clicks" Mobility  Outcome Measure Help needed turning from your back to your side while in a flat bed without using bedrails?: None Help needed moving from lying on your back to sitting on the side of a flat bed without using bedrails?: A Little Help needed moving to and from a bed to a chair (including a wheelchair)?: A Little Help needed standing up from a chair using your arms (e.g., wheelchair or bedside chair)?: None Help needed to walk in  hospital room?: A Little Help needed climbing 3-5 steps with a railing? : A Little 6 Click Score: 20    End of Session Equipment Utilized During Treatment: Gait belt Activity Tolerance: Patient tolerated treatment well Patient left: in chair;with call bell/phone within reach Nurse Communication: Mobility status PT Visit Diagnosis: Muscle weakness (generalized) (M62.81);Pain;Difficulty in walking, not elsewhere classified (R26.2) Pain - Right/Left: Right Pain - part of body: Leg    Time: 0850-0906 PT Time Calculation (min) (ACUTE ONLY): 16 min   Charges:   PT Evaluation $PT Eval Moderate Complexity: 1 Mod          Whitney Walker, PT  Office # [phone] Pager [phone]   Whitney Walker 05/29/2021, 9:16 AM

## 2021-05-29 NOTE — Progress Notes (Addendum)
[facility]  Subjective  - Comfortable this am   Objective (!) 103/58 80 98.2 F (36.8 C) (Oral) 16 100%  Intake/Output Summary (Last 24 hours) at 05/29/2021 0713 Last data filed at 05/29/2021 0630 Gross per 24 hour  Intake 500 ml  Output 397 ml  Net 103 ml    Palpable DP pulses B LE, motor and sensation intact. No sign of ischemic changes to the B LE Lungs non labored breathing  Assessment/Planning: Diffuse arthralgias   Palpable DP pulses B without signs of ischemia   Roxy Horseman 05/29/2021 7:13 AM --  Laboratory Lab Results: Recent Labs    05/28/21 0842 05/29/21 0038  WBC 19.2* 12.7*  HGB 11.6* 10.3*  HCT 37.3 32.0*  PLT 216 176   BMET Recent Labs    05/28/21 0842 05/29/21 0038  NA 130* 130*  K 6.9* 3.9  CL 98 94*  CO2 15* 23  GLUCOSE 79 126*  BUN 69* 27*  CREATININE 12.78* 7.54*  CALCIUM 9.1 8.6*    COAG Lab Results  Component Value Date   INR 1.2 05/28/2021   INR 0.98 07/16/2018   INR 1.19 05/07/2018   No results found for: PTT  I have seen and evaluated the patient. I agree with the PA note as documented above.  States her lower extremity pain and arthralgias and sensitivity is improving since dialysis yesterday.  I think her lower extremity rash looks better.  She did have fever overnight.  Potassium much improved since dialysis.  She has easily palpable dorsalis pedis pulses.  Still unclear about the etiology for her symptoms but encouraging that she is getting better clinically.  Marty Heck, MD [facility] Office: [phone]

## 2021-05-29 NOTE — Progress Notes (Signed)
PROGRESS NOTE        PATIENT DETAILS Name: Whitney Walker Age: 65 y.o. Sex: female Date of Birth: 05-17-1956 Admit Date: 05/28/2021 Admitting Physician Karmen Bongo, MD LFY:BOFBP, Doreene Burke, FNP  Brief Narrative: Patient is a 65 y.o. female with history of ESRD on HD MWF, VTE on anticoagulation, HLD, HTN who presented with bilateral painful nodular lower extremity rash, hyperkalemia.  Post admission she started developing fever.   Significant events: 5/30>> admit for eval/work-up of weakness/painful nodular rash in lower extremities, diffuse arthralgias-found to have hypokalemia.   5/30>> fever up to 101 post admission.  Significant studies: 5/30>> CXR: No pneumonia.  Antimicrobial therapy: None  Microbiology data: 5/30>> COVID/influenza PCR: Negative 5/31>> blood culture: Pending  Procedures : None  Consults: ID, nephrology, vascular surgery  DVT Prophylaxis : apixaban (ELIQUIS) tablet 2.5 mg Start: 05/28/21 1400 apixaban (ELIQUIS) tablet 2.5 mg    Subjective: Polyarthralgias have improved-some pain in her left ankle, and small joints of her left hand.  She continues to have tender nodular areas in the lower extremities.  Denies any genital ulcers.  No eye pain or red eye.  Assessment/Plan: Hyperkalemia: Treated with hemodialysis-potassium has now normalized.    Fever/polyarthralgias/nodular lower extremity skin rash (?erythema nodosum): Broad differentials-from infectious(has TDC) to autoimmune etiology.  Blood cultures obtained earlier this morning-extensive autoimmune work-up sent.  Febrile last night-arthralgias somewhat better-continues to have what looks like erythema nodosum on her lower extremities.  Continue to monitor off antimicrobial therapy given hemodynamic stability-and no foci of infection evident so far.  If infectious work-up is negative-we can contemplate empiric steroids at some point.  Biopsies of these lesions can also  be discussed if these continue.  After discussion with nephrology-have started patient on NSAIDs to see if this alleviates rash/arthralgias.  ESRD on HD MWF: Nephrology following and directing care.  Normocytic anemia: Due to ESRD-defer to nephrology.  HTN: BP stable-on Norvasc/Coreg.  Follow and adjust.  HLD: On statin  History of VTE: On Eliquis.  Obesity: Estimated body mass index is 38.15 kg/m as calculated from the following:   Height as of 05/17/21: 5\' 1"  (1.549 m).   Weight as of this encounter: 91.6 kg.    Diet: Diet Order            Diet renal with fluid restriction Fluid restriction: 1200 mL Fluid; Room service appropriate? Yes; Fluid consistency: Thin  Diet effective now                  Code Status: Full code   Family Communication: None at bedside  Disposition Plan: Status is: Observation  The patient will require care spanning > 2 midnights and should be moved to inpatient because: Inpatient level of care appropriate due to severity of illness  Dispo: The patient is from: Home              Anticipated d/c is to: Home              Patient currently is not medically stable to d/c.   Difficult to place patient No    Barriers to Discharge: Fever/erythema nodosum/polyarthralgias-awaiting culture/autoimmune work-up-needs ongoing inpatient monitoring to determine whether patient needs antibiotics or immunosuppressive agents.  Antimicrobial agents: Anti-infectives (From admission, onward)   None       Time spent: 35 minutes-Greater than 50% of this time was spent in  counseling, explanation of diagnosis, planning of further management, and coordination of care.  MEDICATIONS: Scheduled Meds: . amLODipine  5 mg Oral Daily  . apixaban  2.5 mg Oral BID  . atorvastatin  10 mg Oral Daily  . carvedilol  12.5 mg Oral BID WC  . Chlorhexidine Gluconate Cloth  6 each Topical Q0600  . docusate sodium  100 mg Oral BID  . ferric citrate  420 mg Oral TID with  meals  . ibuprofen  200 mg Oral Q8H  . multivitamin  1 tablet Oral QHS  . sodium chloride flush  3 mL Intravenous Q12H   Continuous Infusions: PRN Meds:.acetaminophen **OR** acetaminophen, albuterol, bisacodyl, calcium carbonate (dosed in mg elemental calcium), camphor-menthol **AND** hydrOXYzine, docusate sodium, feeding supplement (NEPRO CARB STEADY), fentaNYL (SUBLIMAZE) injection, hydrALAZINE, ondansetron **OR** ondansetron (ZOFRAN) IV, polyethylene glycol, sorbitol, zolpidem   PHYSICAL EXAM: Vital signs: Vitals:   05/28/21 2321 05/29/21 0420 05/29/21 0724 05/29/21 1215  BP: (!) 101/48 (!) 103/58 (!) 105/57 (!) 110/59  Pulse: 96 80 87 82  Resp: 19 16 17 19   Temp: 98.8 F (37.1 C) 98.2 F (36.8 C) 98 F (36.7 C) 98 F (36.7 C)  TempSrc: Oral Oral Oral Oral  SpO2: 98% 100% 99% 95%  Weight:  91.6 kg     Filed Weights   05/28/21 1420 05/28/21 1730 05/29/21 0420  Weight: 92.7 kg 92.3 kg 91.6 kg   Body mass index is 38.15 kg/m.   Gen Exam:Alert awake-not in any distress HEENT:atraumatic, normocephalic Chest: B/L clear to auscultation anteriorly CVS:S1S2 regular Abdomen:soft non tender, non distended Extremities:no edema Neurology: Non focal Skin: no rash  I have personally reviewed following labs and imaging studies  LABORATORY DATA: CBC: Recent Labs  Lab 05/28/21 0842 05/29/21 0038  WBC 19.2* 12.7*  NEUTROABS 16.9*  --   HGB 11.6* 10.3*  HCT 37.3 32.0*  MCV 93.7 90.4  PLT 216 016    Basic Metabolic Panel: Recent Labs  Lab 05/28/21 0842 05/29/21 0038  NA 130* 130*  K 6.9* 3.9  CL 98 94*  CO2 15* 23  GLUCOSE 79 126*  BUN 69* 27*  CREATININE 12.78* 7.54*  CALCIUM 9.1 8.6*  MG 2.0  --     GFR: Estimated Creatinine Clearance: 7.7 mL/min (A) (by C-G formula based on SCr of 7.54 mg/dL (H)).  Liver Function Tests: Recent Labs  Lab 05/28/21 0842  AST 20  ALT 16  ALKPHOS 111  BILITOT 0.3  PROT 7.2  ALBUMIN 2.7*   No results for input(s):  LIPASE, AMYLASE in the last 168 hours. No results for input(s): AMMONIA in the last 168 hours.  Coagulation Profile: Recent Labs  Lab 05/28/21 0842  INR 1.2    Cardiac Enzymes: No results for input(s): CKTOTAL, CKMB, CKMBINDEX, TROPONINI in the last 168 hours.  BNP (last 3 results) No results for input(s): PROBNP in the last 8760 hours.  Lipid Profile: No results for input(s): CHOL, HDL, LDLCALC, TRIG, CHOLHDL, LDLDIRECT in the last 72 hours.  Thyroid Function Tests: No results for input(s): TSH, T4TOTAL, FREET4, T3FREE, THYROIDAB in the last 72 hours.  Anemia Panel: No results for input(s): VITAMINB12, FOLATE, FERRITIN, TIBC, IRON, RETICCTPCT in the last 72 hours.  Urine analysis:    Component Value Date/Time   COLORURINE YELLOW 03/02/2019 1416   APPEARANCEUR CLEAR 03/02/2019 1416   LABSPEC 1.010 03/02/2019 1416   PHURINE 6.0 03/02/2019 1416   GLUCOSEU 50 (A) 03/02/2019 1416   HGBUR SMALL (A) 03/02/2019 1416  BILIRUBINUR NEGATIVE 03/02/2019 1416   KETONESUR 5 (A) 03/02/2019 1416   PROTEINUR 100 (A) 03/02/2019 1416   UROBILINOGEN 0.2 10/19/2015 1532   NITRITE NEGATIVE 03/02/2019 1416   LEUKOCYTESUR NEGATIVE 03/02/2019 1416    Sepsis Labs: Lactic Acid, Venous    Component Value Date/Time   LATICACIDVEN 1.7 10/30/2020 0407    MICROBIOLOGY: Recent Results (from the past 240 hour(s))  Resp Panel by RT-PCR (Flu A&B, Covid) Nasopharyngeal Swab     Status: None   Collection Time: 05/28/21  8:44 AM   Specimen: Nasopharyngeal Swab; Nasopharyngeal(NP) swabs in vial transport medium  Result Value Ref Range Status   SARS Coronavirus 2 by RT PCR NEGATIVE NEGATIVE Final    Comment: (NOTE) SARS-CoV-2 target nucleic acids are NOT DETECTED.  The SARS-CoV-2 RNA is generally detectable in upper respiratory specimens during the acute phase of infection. The lowest concentration of SARS-CoV-2 viral copies this assay can detect is 138 copies/mL. A negative result does not  preclude SARS-Cov-2 infection and should not be used as the sole basis for treatment or other patient management decisions. A negative result may occur with  improper specimen collection/handling, submission of specimen other than nasopharyngeal swab, presence of viral mutation(s) within the areas targeted by this assay, and inadequate number of viral copies(<138 copies/mL). A negative result must be combined with clinical observations, patient history, and epidemiological information. The expected result is Negative.  Fact Sheet for Patients:  EntrepreneurPulse.com.au  Fact Sheet for Healthcare Providers:  IncredibleEmployment.be  This test is no t yet approved or cleared by the [place] FDA and  has been authorized for detection and/or diagnosis of SARS-CoV-2 by FDA under an Emergency Use Authorization (EUA). This EUA will remain  in effect (meaning this test can be used) for the duration of the COVID-19 declaration under Section 564(b)(1) of the Act, 21 U.S.C.section 360bbb-3(b)(1), unless the authorization is terminated  or revoked sooner.       Influenza A by PCR NEGATIVE NEGATIVE Final   Influenza B by PCR NEGATIVE NEGATIVE Final    Comment: (NOTE) The Xpert Xpress SARS-CoV-2/FLU/RSV plus assay is intended as an aid in the diagnosis of influenza from Nasopharyngeal swab specimens and should not be used as a sole basis for treatment. Nasal washings and aspirates are unacceptable for Xpert Xpress SARS-CoV-2/FLU/RSV testing.  Fact Sheet for Patients: EntrepreneurPulse.com.au  Fact Sheet for Healthcare Providers: IncredibleEmployment.be  This test is not yet approved or cleared by the [place] FDA and has been authorized for detection and/or diagnosis of SARS-CoV-2 by FDA under an Emergency Use Authorization (EUA). This EUA will remain in effect (meaning this test can be used) for the  duration of the COVID-19 declaration under Section 564(b)(1) of the Act, 21 U.S.C. section 360bbb-3(b)(1), unless the authorization is terminated or revoked.  Performed at [facility], [place] [address], [place], [place] [address]     RADIOLOGY STUDIES/RESULTS: DG Chest Port 1 View  Result Date: 05/28/2021 CLINICAL DATA:  Cough. Bilateral leg pain and rash beginning 3 days ago after dialysis. EXAM: PORTABLE CHEST 1 VIEW COMPARISON:  Chest CT, 02/27/2021. Chest radiographs, 02/05/2021 and older studies. FINDINGS: Cardiac silhouette normal in size.  No mediastinal or hilar masses. Opacity at the left lung base consistent with atelectasis likely with a small effusion as noted on the prior chest CT. Remainder of the lungs is clear. No pneumothorax. Right internal jugular dual-lumen tunneled central venous catheter is stable. Skeletal structures are grossly intact. IMPRESSION: No acute cardiopulmonary disease.  Electronically Signed   By: Lajean Manes M.D.   On: 05/28/2021 09:04     LOS: 0 days   Oren Binet, MD  [facility]    To contact the attending provider between 7A-7P or the covering provider during after hours 7P-7A, please log into the web site [email or web address] and access using universal [place] password for that web site. If you do not have the password, please call the hospital operator.  05/29/2021, 12:55 PM

## 2021-05-29 NOTE — Consult Note (Signed)
Date of Admission:  05/28/2021          Reason for Consult: fever, multiple painful joints along with painful rash on lower extremities    Referring Provider: Dr. Sloan Leiter   Assessment:  1. Fever 2. Multiple painful joints some of which are improving and others that have started hurting more recently 3. Painful diffuse rash on legs in particular 4. Generalized weakness 5. End-stage renal disease on hemodialysis 6. Recent hemodialysis Cook catheter placed in the left subclavian on 30 May  Plan:  1. We will send HIV RNA, hepatitis C antibody SPEP cryoglobulins. ACE level  It appears that an ANCA has been sent sed rate and CRP to be sent as well 2. Blood cultures have been sent 3. Could consider skin biopsy though she seems to me to be doing better clinically 4. Manage symptomatically for now  I do not think she has much evidence for systemic bacterial infection or a polyarticular septic arthritis, though it was certainly wise to get blood cultures and I would certainly monitor her dialysis catheter site for evidence of infection  This seems to be more of a possible drug-induced polyarthritis with rash potentially vasculitis or a vasculitis that is declaring itself for some other reason. Polyarteritis nodosa considered.  She  did start omeprazole recently with her oncologist and this could be a culprit for her symptoms.    Principal Problem:   Hyperkalemia, diminished renal excretion Active Problems:   Essential hypertension   ESRD on dialysis (HCC)   Generalized weakness   Rash and nonspecific skin eruption   Scheduled Meds: . amLODipine  5 mg Oral Daily  . apixaban  2.5 mg Oral BID  . atorvastatin  10 mg Oral Daily  . carvedilol  12.5 mg Oral BID WC  . Chlorhexidine Gluconate Cloth  6 each Topical Q0600  . docusate sodium  100 mg Oral BID  . ferric citrate  420 mg Oral TID with meals  . ibuprofen  200 mg Oral Q8H  . multivitamin  1 tablet Oral QHS  . sodium  chloride flush  3 mL Intravenous Q12H   Continuous Infusions: PRN Meds:.acetaminophen **OR** acetaminophen, albuterol, bisacodyl, calcium carbonate (dosed in mg elemental calcium), camphor-menthol **AND** hydrOXYzine, docusate sodium, feeding supplement (NEPRO CARB STEADY), fentaNYL (SUBLIMAZE) injection, hydrALAZINE, ondansetron **OR** ondansetron (ZOFRAN) IV, polyethylene glycol, sorbitol, zolpidem  HPI: Morgane Joerger is a 65 y.o. female with multiple medical problems including peripheral vascular disease end-stage renal disease on hemodialysis history of deep venous thrombosis and pulmonary embolism coronary artery disease COPD colon cancer who reported that Friday last week after undergoing hemodialysis she developed edema in her finger with pain.  She also began having pain in her knees and in her heel and was having difficulty walking.  She developed a diffuse painful rash on her legs.  She then had pain move into her right ankle and leg.  She then separately developed shoulder blade pain in her left side with reduced range of motion.  She initially thought the lesions on her legs were due to bruising due to the fact that she was on Eliquis.  She also became febrile and came to the ER where she was noted to have a temperature of 102.3 degrees degrees.  Labs pertinent for some leukocytosis with a white count of 19,200 with 88% PMNs her sed rate was high at 90 and CRP was 16.6 more recently 25.5.    She had blood cultures taken which so  far have been unrevealing.  Since admission she has been seen by vascular surgery and various labs been sent.   In terms of infectious diseases her respiratory panel was negative and her HIV fourth-generation assay was negative she is immune to hepatitis B based on prior serologies.  I will recheck hepatitis C with her as well as check SPEP cryoglobulins and I believe an ANCA has been sent already I will set check an HIV RNA in case this is an  declaration of acute HIV infection.  We will also send off some parvovirus serologies.  Certainly I think it is not impossible that she could have a bloodstream infection that we just do not know about yet because the cultures are negative but her migrating probably arthritis which seems now to be getting better is not consistent with polyarticular septic arthritis.  She DOES seem to be getting better with PPI DC'd at present.   I spent greater than 80  minutes with the patient including greater than 50% of time in face to face counsel of the patient reviewing her radiographs personally her laboratory microbiological data and in coordination of her care.        Review of Systems: Review of Systems  Constitutional: Positive for diaphoresis, fever and malaise/fatigue. Negative for chills.  HENT: Negative for congestion and sore throat.   Eyes: Negative for blurred vision and photophobia.  Respiratory: Negative for cough, shortness of breath and wheezing.   Cardiovascular: Negative for chest pain, palpitations and leg swelling.  Gastrointestinal: Negative for abdominal pain, blood in stool, constipation, diarrhea, heartburn, melena, nausea and vomiting.  Genitourinary: Negative for dysuria, flank pain and hematuria.  Musculoskeletal: Positive for joint pain and myalgias. Negative for back pain and falls.  Skin: Negative for itching and rash.  Neurological: Negative for dizziness, focal weakness, loss of consciousness, weakness and headaches.  Endo/Heme/Allergies: Does not bruise/bleed easily.  Psychiatric/Behavioral: Negative for depression and suicidal ideas. The patient does not have insomnia.     Past Medical History:  Diagnosis Date  . Anemia   . Anxiety   . CHF (congestive heart failure) ([place])   . Colon cancer [facility])    treatment surgery  . Complication of anesthesia    after first C- [place] "couldnt walk after", patient denies having a spinal  . COPD (chronic obstructive  pulmonary disease) ([place])   . Coronary artery disease   . Depression   . DVT (deep venous thrombosis) ([place])   . ESRD (end stage renal disease) ([place])    Hemo: MWF  . History of blood transfusion 04/2018  . Hypertension    07/07/18- no longer takes BP medications  . Meningitis   . Pain in limb 07/30/2013  . PE (pulmonary embolism)   . Peripheral vascular disease ([place])   . Restless legs   . Shortness of breath    with exertion  . Sleep apnea   . SOB (shortness of breath) 03/03/2019  . Vertigo     Social History   Tobacco Use  . Smoking status: Current Every Day Smoker    Packs/day: 0.50    Years: 50.00    Pack years: 25.00    Types: Cigarettes    Last attempt to quit: 02/03/2021    Years since quitting: 0.3  . Smokeless tobacco: Former Network engineer  . Vaping Use: Never used  Substance Use Topics  . Alcohol use: No    Alcohol/week: 0.0 standard drinks    Comment: she used to  drink alcohol, quit in 2010; h/o heavy use   . Drug use: Yes    Types: Oxycodone    Family History  Problem Relation Age of Onset  . Cancer Mother 65       breast and bone  . Cancer Father 72       prostate  . Hypertension Sister   . Bleeding Disorder Sister   . Cancer Cousin 20       breast cancer   . Hypertension Daughter   . Breast cancer Neg Hx    Allergies  Allergen Reactions  . Carnosine     Other reaction(s): Unknown  . Gadolinium Derivatives Hives and Other (See Comments)    HIVES, Desc: HIVES W/ "DYE" USED FOR 1ST CT SCAN BUT NOT 2ND, NO PREMEDS USED, PT UNCERTAIN OF CIRCUMSTANCES,,?POSSIBLE MRI CONTRAST ALLERGY, ALL STUDIES DONE "SOMEWHERE" IN [place]//A.C., Onset Date: [id number]  . Iohexol Other (See Comments)     Code: HIVES, Desc: HIVES W/ "DYE" USED FOR 1ST CT SCAN BUT NOT 2ND, NO PREMEDS USED, PT UNCERTAIN OF CIRCUMSTANCES,,?POSSIBLE MRI CONTRAST ALLERGY, ALL STUDIES DONE "SOMEWHERE" IN [place]//A.C., Onset Date: [id number]   . Iodine Hives  . Naltrexone     Other  reaction(s): Unknown    OBJECTIVE: Blood pressure (!) 105/57, pulse 87, temperature 98 F (36.7 C), temperature source Oral, resp. rate 17, weight 91.6 kg, SpO2 99 %.  Physical Exam Vitals reviewed.  Constitutional:      General: She is not in acute distress.    Appearance: Normal appearance. She is well-developed. She is not ill-appearing or diaphoretic.  HENT:     Head: Normocephalic and atraumatic.     Right Ear: Hearing and external ear normal.     Left Ear: Hearing and external ear normal.     Nose: No nasal deformity or rhinorrhea.  Eyes:     General: No scleral icterus.    Conjunctiva/sclera: Conjunctivae normal.     Right eye: Right conjunctiva is not injected.     Left eye: Left conjunctiva is not injected.     Pupils: Pupils are equal, round, and reactive to light.  Neck:     Vascular: No JVD.  Cardiovascular:     Rate and Rhythm: Normal rate and regular rhythm.     Heart sounds: Normal heart sounds, S1 normal and S2 normal. No murmur heard. No friction rub. No gallop.   Pulmonary:     Effort: Pulmonary effort is normal. No respiratory distress.     Breath sounds: No stridor. No wheezing or rhonchi.  Abdominal:     General: Bowel sounds are normal. There is no distension.     Palpations: Abdomen is soft.     Tenderness: There is no abdominal tenderness.  Musculoskeletal:     Right shoulder: Normal.     Left shoulder: Tenderness present. No swelling, deformity or effusion. Decreased range of motion.     Right wrist: Normal.     Left wrist: Normal.     Cervical back: Normal range of motion and neck supple.     Right knee: No swelling, deformity, effusion or erythema. Decreased range of motion.     Left knee: No swelling, deformity, effusion or erythema. Decreased range of motion.  Lymphadenopathy:     Head:     Right side of head: No submandibular, preauricular or posterior auricular adenopathy.     Left side of head: No submandibular, preauricular or posterior  auricular adenopathy.     Cervical: No  cervical adenopathy.     Right cervical: No superficial or deep cervical adenopathy.    Left cervical: No superficial or deep cervical adenopathy.  Skin:    General: Skin is warm and dry.     Coloration: Skin is not pale.     Findings: No abrasion, bruising, ecchymosis, erythema, lesion or rash.     Nails: There is no clubbing.  Neurological:     General: No focal deficit present.     Mental Status: She is alert and oriented to person, place, and time.     Sensory: No sensory deficit.     Coordination: Coordination normal.     Gait: Gait normal.  Psychiatric:        Attention and Perception: She is attentive.        Mood and Affect: Mood normal.        Speech: Speech normal.        Behavior: Behavior normal. Behavior is cooperative.        Thought Content: Thought content normal.        Judgment: Judgment normal.   Rash on admission:           Rash picture today May 29, 2021:      Rash on back which is different than the rest of the rash it is not tender chronicity unknown May first 2022:       Hemodialysis catheter       Lab Results Lab Results  Component Value Date   WBC 12.7 (H) 05/29/2021   HGB 10.3 (L) 05/29/2021   HCT 32.0 (L) 05/29/2021   MCV 90.4 05/29/2021   PLT 176 05/29/2021    Lab Results  Component Value Date   CREATININE 7.54 (H) 05/29/2021   BUN 27 (H) 05/29/2021   NA 130 (L) 05/29/2021   K 3.9 05/29/2021   CL 94 (L) 05/29/2021   CO2 23 05/29/2021    Lab Results  Component Value Date   ALT 16 05/28/2021   AST 20 05/28/2021   ALKPHOS 111 05/28/2021   BILITOT 0.3 05/28/2021     Microbiology: Recent Results (from the past 240 hour(s))  Resp Panel by RT-PCR (Flu A&B, Covid) Nasopharyngeal Swab     Status: None   Collection Time: 05/28/21  8:44 AM   Specimen: Nasopharyngeal Swab; Nasopharyngeal(NP) swabs in vial transport medium  Result Value Ref Range Status   SARS Coronavirus 2 by  RT PCR NEGATIVE NEGATIVE Final    Comment: (NOTE) SARS-CoV-2 target nucleic acids are NOT DETECTED.  The SARS-CoV-2 RNA is generally detectable in upper respiratory specimens during the acute phase of infection. The lowest concentration of SARS-CoV-2 viral copies this assay can detect is 138 copies/mL. A negative result does not preclude SARS-Cov-2 infection and should not be used as the sole basis for treatment or other patient management decisions. A negative result may occur with  improper specimen collection/handling, submission of specimen other than nasopharyngeal swab, presence of viral mutation(s) within the areas targeted by this assay, and inadequate number of viral copies(<138 copies/mL). A negative result must be combined with clinical observations, patient history, and epidemiological information. The expected result is Negative.  Fact Sheet for Patients:  EntrepreneurPulse.com.au  Fact Sheet for Healthcare Providers:  IncredibleEmployment.be  This test is no t yet approved or cleared by the [place] FDA and  has been authorized for detection and/or diagnosis of SARS-CoV-2 by FDA under an Emergency Use Authorization (EUA). This EUA will remain  in effect (meaning  this test can be used) for the duration of the COVID-19 declaration under Section 564(b)(1) of the Act, 21 U.S.C.section 360bbb-3(b)(1), unless the authorization is terminated  or revoked sooner.       Influenza A by PCR NEGATIVE NEGATIVE Final   Influenza B by PCR NEGATIVE NEGATIVE Final    Comment: (NOTE) The Xpert Xpress SARS-CoV-2/FLU/RSV plus assay is intended as an aid in the diagnosis of influenza from Nasopharyngeal swab specimens and should not be used as a sole basis for treatment. Nasal washings and aspirates are unacceptable for Xpert Xpress SARS-CoV-2/FLU/RSV testing.  Fact Sheet for Patients: EntrepreneurPulse.com.au  Fact Sheet  for Healthcare Providers: IncredibleEmployment.be  This test is not yet approved or cleared by the [place] FDA and has been authorized for detection and/or diagnosis of SARS-CoV-2 by FDA under an Emergency Use Authorization (EUA). This EUA will remain in effect (meaning this test can be used) for the duration of the COVID-19 declaration under Section 564(b)(1) of the Act, 21 U.S.C. section 360bbb-3(b)(1), unless the authorization is terminated or revoked.  Performed at [facility], [place] [address], [place], [place] [address]     Alcide Evener, [facility] [phone] pager  05/29/2021, 11:07 AM

## 2021-05-29 NOTE — Progress Notes (Signed)
Initial Nutrition Assessment  DOCUMENTATION CODES:   Obesity unspecified  INTERVENTION:   - Continue Nepro Shake po TID PRN, each supplement provides 425 kcal and 19 grams protein  - Continue renal MVI  - Diet education provided  NUTRITION DIAGNOSIS:   Increased nutrient needs related to chronic illness as evidenced by estimated needs.  GOAL:   Patient will meet greater than or equal to 90% of their needs  MONITOR:   PO intake,Supplement acceptance,Labs,Weight trends,I & O's  REASON FOR ASSESSMENT:   Consult Other ("nutritional goals")  ASSESSMENT:   65 year old female who presented to the ED on 5/30 with BLE pain and rash. PMH of OSA, PVD, ESRD on HD, DVT/PE, CAD, COPD, colon cancer, CHF.   Pt admitted with hyperkalemia and had HD yesterday with 397 ml net UF.  Spoke with pt at bedside. Pt was frustrated that RD was interrupting lunch. She states people have been interrupting her meals all day. Pt is also frustrated that she is on a renal diet. Discussed renal diet restrictions with pt who reports that she follows closely with her HD center RD. Pt denies any dietary indiscretion but also reports that she does eat small servings of mashed potatoes and tomatoes, both of which are high in potassium. RD encouraged pt to review the high potassium foods list and offered to supply pt with a printout but pt declined, stating she already has all of that information. Pt states that she does take her phosphorus binders at home but that they "don't do anything."  Pt denies any issues with her appetite. RD noted 100% completed lunch meal tray at bedside by end of RD visit. Pt also denies any recent changes in weight. Reviewed weight history in chart. Pt's weight has fluctuated between 89-92 kg over the last year. EDW is 90 kg per Nephrology note.  Medications reviewed and include: colace, ferric citrate TID with meals, rena-vit  Labs reviewed: sodium 130  NUTRITION - FOCUSED PHYSICAL  EXAM:  Deferred. Pt eating lunch at time of visit and requested to continue eating.  Diet Order:   Diet Order            Diet renal with fluid restriction Fluid restriction: 1200 mL Fluid; Room service appropriate? Yes; Fluid consistency: Thin  Diet effective now                 EDUCATION NEEDS:   Education needs have been addressed  Skin:  Skin Assessment: Reviewed RN Assessment  Last BM:  05/28/21  Height:   Ht Readings from Last 1 Encounters:  05/17/21 5\' 1"  (1.549 m)    Weight:   Wt Readings from Last 1 Encounters:  05/29/21 91.6 kg    BMI:  Body mass index is 38.15 kg/m.  Estimated Nutritional Needs:   Kcal:  1800-2000  Protein:  85-100 grams  Fluid:  1000 ml + UOP    Gustavus Bryant, MS, RD, LDN Inpatient Clinical Dietitian Please see AMiON for contact information.

## 2021-05-29 NOTE — Evaluation (Signed)
Occupational Therapy Evaluation/Discharge Patient Details Name: Whitney Walker MRN: [id number] DOB: 11/11/1956 Today's Date: 05/29/2021    History of Present Illness Pt is a 65 y/o female presenting with progressive leg pain and rash, as well as pain leading up to R shoulder/fingers. Workup for hyperkalemia, diminished renal excretion.  PMH: OSA; PVD; ESRD on MWF HD; DVT/PE; CAD; COPD; and colon cancer.   Clinical Impression   PTA, pt lives alone and reports Modified Independence with ADLs, IADLs in the home and mobility with RW (and without at times). Pt presents now with continued reports of LE pain though does not hinder functional abilities. Pt Min A for bed mobility, Modified Independent for transfers and short mobility to/from sink for ADLs. Pt reports hx of vertigo though denies any dizziness at this time. Pt able to perform LB dressing tasks with Modified Independence. Pt adamantly self-ended session due to wanting to avoid grits getting cold as breakfast delivered while OT present. Pt left sitting EOB with breakfast (no grits on tray) with needs in reach. Anticipate no difficulty in completing ADLs/IADLs in the home at discharge. OT to sign off at acute level.     Follow Up Recommendations  No OT follow up    Equipment Recommendations  None recommended by OT    Recommendations for Other Services       Precautions / Restrictions Precautions Precautions: Fall Restrictions Weight Bearing Restrictions: No      Mobility Bed Mobility Overal bed mobility: Needs Assistance Bed Mobility: Supine to Sit     Supine to sit: Min assist;HOB elevated     General bed mobility comments: Min A to advance trunk, use of bed rails    Transfers Overall transfer level: Modified independent Equipment used: Rolling walker (2 wheeled)                  Balance Overall balance assessment: Needs assistance Sitting-balance support: No upper extremity supported;Feet  supported Sitting balance-Leahy Scale: Good     Standing balance support: During functional activity;Bilateral upper extremity supported Standing balance-Leahy Scale: Fair Standing balance comment: fair static standing at sink without AD, able to mobilize short distance without AD though limitations evident                           ADL either performed or assessed with clinical judgement   ADL Overall ADL's : Modified independent                                       General ADL Comments: Able to don grip socks sitting EOB, mobilize to/from sink with RW for hand hygiene and without RW, no LOB. Pt reports pain in B LE during activities though does not appear to  impact functional abilities.     Vision Patient Visual Report: No change from baseline Vision Assessment?: No apparent visual deficits     Perception     Praxis      Pertinent Vitals/Pain Pain Assessment: Faces Faces Pain Scale: Hurts a little bit Pain Location: B LE in standing Pain Descriptors / Indicators: Sore Pain Intervention(s): Monitored during session     Hand Dominance Right   Extremity/Trunk Assessment Upper Extremity Assessment Upper Extremity Assessment: Overall WFL for tasks assessed   Lower Extremity Assessment Lower Extremity Assessment: Defer to PT evaluation   Cervical / Trunk Assessment Cervical / Trunk Assessment:  Normal   Communication Communication Communication: No difficulties   Cognition Arousal/Alertness: Awake/alert Behavior During Therapy: WFL for tasks assessed/performed Overall Cognitive Status: Within Functional Limits for tasks assessed                                     General Comments  BP 105/56 on L forearm at start of session, pt denies dizziness with activity. Noted NWB on whiteboard in room though no orders present or evidence in notes of WB restrictions - collab with RN    Exercises     Shoulder Instructions       Home Living Family/patient expects to be discharged to:: Private residence Living Arrangements: Alone Available Help at Discharge: Family Type of Home: Apartment Home Access: Stairs to enter [employer] of Steps: 1 step up in front Entrance Stairs-Rails: Right;Left Home Layout: One level     Bathroom Shower/Tub: Tub/shower unit;Curtain   Bathroom Toilet: Handicapped height     Home Equipment: Clinical cytogeneticist - 2 wheels;Walker - 4 wheels;Grab bars - toilet;Grab bars - tub/shower   Additional Comments: states shower seat does not fit in her tub, tub soaks rather than shower due to HD cath      Prior Functioning/Environment Level of Independence: Independent with assistive device(s)        Comments: uses of RW vs no AD use pending weakness. Pt reports difficulties with vertigo causing balance issues. Pt Modified Independent for ADLs/IADLs, uses transportation to go to/from HD. Reports she has Rollator that needs to be put together        OT Problem List:        OT Treatment/Interventions:      OT Goals(Current goals can be found in the care plan section) Acute Rehab OT Goals Patient Stated Goal: eat my grits before they get cold OT Goal Formulation: All assessment and education complete, DC therapy  OT Frequency:     Barriers to D/C:            Co-evaluation              AM-PAC OT "6 Clicks" Daily Activity     Outcome Measure Help from another person eating meals?: None Help from another person taking care of personal grooming?: None Help from another person toileting, which includes using toliet, bedpan, or urinal?: None Help from another person bathing (including washing, rinsing, drying)?: None Help from another person to put on and taking off regular upper body clothing?: None Help from another person to put on and taking off regular lower body clothing?: None 6 Click Score: 24   End of Session Equipment Utilized During Treatment:  Rolling walker Nurse Communication: Mobility status  Activity Tolerance: Patient tolerated treatment well Patient left: in bed;with call bell/phone within reach (sitting EOB with breakfast)  OT Visit Diagnosis: Unsteadiness on feet (R26.81)                Time: 6834-1962 OT Time Calculation (min): 15 min Charges:  OT General Charges $OT Visit: 1 Visit OT Evaluation $OT Eval Low Complexity: 1 Low  Malachy Chamber, OTR/L Acute Rehab Services Office: [phone]  Layla Maw 05/29/2021, 7:51 AM

## 2021-05-29 NOTE — Telephone Encounter (Signed)
  Chronic Care Management   Inpatient Admit Review Note  05/29/2021 Name: Whitney Walker MRN: [id number] DOB: [birth year]  Whitney Walker is a 65 y.o. year old female who is a primary care patient of [name], [place]. Whitney Walker is actively engaged with the embedded care management team in the primary care practice and is being followed by RN Case Manager Pharmacist for assistance with disease management and care coordination needs related to CHF, HTN and ESRD.   Whitney Walker is currently admitted to the hospital for evaluation and treatment of Hyperkalemia.   Plan: CM team will collaborate with [facility] and will follow patient post discharge.    [name], BSW, CDP Social Worker, Certified Dementia Practitioner [place] [facility] [place] [facility] [phone]

## 2021-05-29 NOTE — Progress Notes (Signed)
[facility] Progress Note  Subjective: pt feeling a little better, legs don't hurt as much. Shares issue w/ "drainage" from [facility] for last 3-4 mos, not fluid like but will have "crusting" at exit site from time to time. Says they cultured it at HD once and tests were negative.   Vitals:   05/28/21 2321 05/29/21 0420 05/29/21 0724 05/29/21 1215  BP: (!) 101/48 (!) 103/58 (!) 105/57 (!) 110/59  Pulse: 96 80 87 82  Resp: 19 16 17 19   Temp: 98.8 F (37.1 C) 98.2 F (36.8 C) 98 F (36.7 C) 98 F (36.7 C)  TempSrc: Oral Oral Oral Oral  SpO2: 98% 100% 99% 95%  Weight:  91.6 kg      Exam:   alert, nad   no jvd  Chest cta bilat  Cor reg no RG  Abd soft ntnd no ascites   Ext no LE edema, scattered light red skin lesions bilat lower legs   Alert, NF, ox3    R IJ TDC      [facility] HD: [facility]   3.5h  400/500  2/2 bath  P2  TDC RIJ  90kg  Hep 6000  - no esa/ vdra  - home renal = auryxia 3 ac tid/ norvasc 5/ coreg 12.5 bid/ renavite qd/ fosrenol 1gm w/ each meal and snack   Assessment/ Plan: 1. Fever/ LE rash/ weakness: Scattered erythematous lesions w/ fevers and gen weakness/ ^WBC count. Blood cx's pending. She does have TDC and reports hx of "crusting" off and on for the last 3-73mos. Will examine on HD tomorrow.  2. Hyperkalemia: K+ runs high outpatient, resolved w/ HD yesterday.  3.  ESRD:  Dialyzes MWF schedule, received abbreviated HD on Friday. Planned for urgent HD today as above.  4. HTN/ volume: BP soft, on norvasc and coreg. Will dc norvasc. Close to dry wt, no vol^ on exam.  5.  Anemia ckd: Hgb 11.6, no ESA indicated at this time.  6.  Metabolic bone disease: Corrected calcium elevated. Not on VDRA outpatient. Continue low calcium bath with HD. Resume phosphorus binders once tolerating PO intake. 7. Nutrition:  Renal diet (low K+, low phos) very important due to tendency towards hyperkalemia. Will need protein supplementation once tolerating po.      Whitney  Costas Walker 05/29/2021, 1:58 PM   Recent Labs  Lab 05/28/21 0842 05/29/21 0038  K 6.9* 3.9  BUN 69* 27*  CREATININE 12.78* 7.54*  CALCIUM 9.1 8.6*  HGB 11.6* 10.3*   Inpatient medications: . amLODipine  5 mg Oral Daily  . apixaban  2.5 mg Oral BID  . atorvastatin  10 mg Oral Daily  . carvedilol  12.5 mg Oral BID WC  . Chlorhexidine Gluconate Cloth  6 each Topical Q0600  . docusate sodium  100 mg Oral BID  . ferric citrate  420 mg Oral TID with meals  . ibuprofen  200 mg Oral Q8H  . multivitamin  1 tablet Oral QHS  . sodium chloride flush  3 mL Intravenous Q12H    acetaminophen **OR** acetaminophen, albuterol, bisacodyl, calcium carbonate (dosed in mg elemental calcium), camphor-menthol **AND** hydrOXYzine, docusate sodium, feeding supplement (NEPRO CARB STEADY), fentaNYL (SUBLIMAZE) injection, hydrALAZINE, ondansetron **OR** ondansetron (ZOFRAN) IV, polyethylene glycol, sorbitol, zolpidem

## 2021-05-30 DIAGNOSIS — R509 Fever, unspecified: Secondary | ICD-10-CM | POA: Diagnosis not present

## 2021-05-30 DIAGNOSIS — R531 Weakness: Secondary | ICD-10-CM | POA: Diagnosis not present

## 2021-05-30 DIAGNOSIS — R21 Rash and other nonspecific skin eruption: Secondary | ICD-10-CM | POA: Diagnosis not present

## 2021-05-30 DIAGNOSIS — M130 Polyarthritis, unspecified: Secondary | ICD-10-CM | POA: Diagnosis not present

## 2021-05-30 DIAGNOSIS — M2550 Pain in unspecified joint: Secondary | ICD-10-CM | POA: Diagnosis not present

## 2021-05-30 DIAGNOSIS — Z992 Dependence on renal dialysis: Secondary | ICD-10-CM | POA: Diagnosis not present

## 2021-05-30 DIAGNOSIS — L52 Erythema nodosum: Secondary | ICD-10-CM

## 2021-05-30 DIAGNOSIS — I10 Essential (primary) hypertension: Secondary | ICD-10-CM | POA: Diagnosis not present

## 2021-05-30 DIAGNOSIS — E875 Hyperkalemia: Secondary | ICD-10-CM | POA: Diagnosis not present

## 2021-05-30 DIAGNOSIS — N186 End stage renal disease: Secondary | ICD-10-CM | POA: Diagnosis not present

## 2021-05-30 LAB — RENAL FUNCTION PANEL
Albumin: 2.5 g/dL — ABNORMAL LOW (ref 3.5–5.0)
Anion gap: 14 (ref 5–15)
BUN: 50 mg/dL — ABNORMAL HIGH (ref 8–23)
CO2: 24 mmol/L (ref 22–32)
Calcium: 9.6 mg/dL (ref 8.9–10.3)
Chloride: 96 mmol/L — ABNORMAL LOW (ref 98–111)
Creatinine, Ser: 9.84 mg/dL — ABNORMAL HIGH (ref 0.44–1.00)
GFR, Estimated: 4 mL/min — ABNORMAL LOW (ref >60)
Glucose, Bld: 108 mg/dL — ABNORMAL HIGH (ref 70–99)
Phosphorus: 6.5 mg/dL — ABNORMAL HIGH (ref 2.5–4.6)
Potassium: 4.3 mmol/L (ref 3.5–5.1)
Sodium: 134 mmol/L — ABNORMAL LOW (ref 135–145)

## 2021-05-30 LAB — CBC WITH DIFFERENTIAL/PLATELET
Abs Immature Granulocytes: 0.08 10*3/uL — ABNORMAL HIGH (ref 0.00–0.07)
Basophils Absolute: 0 10*3/uL (ref 0.0–0.1)
Basophils Relative: 0 %
Eosinophils Absolute: 0.7 10*3/uL — ABNORMAL HIGH (ref 0.0–0.5)
Eosinophils Relative: 6 %
HCT: 32 % — ABNORMAL LOW (ref 36.0–46.0)
Hemoglobin: 10.1 g/dL — ABNORMAL LOW (ref 12.0–15.0)
Immature Granulocytes: 1 %
Lymphocytes Relative: 19 %
Lymphs Abs: 2.1 10*3/uL (ref 0.7–4.0)
MCH: 28.8 pg (ref 26.0–34.0)
MCHC: 31.6 g/dL (ref 30.0–36.0)
MCV: 91.2 fL (ref 80.0–100.0)
Monocytes Absolute: 1 10*3/uL (ref 0.1–1.0)
Monocytes Relative: 9 %
Neutro Abs: 7.2 10*3/uL (ref 1.7–7.7)
Neutrophils Relative %: 65 %
Platelets: 203 10*3/uL (ref 150–400)
RBC: 3.51 MIL/uL — ABNORMAL LOW (ref 3.87–5.11)
RDW: 16.1 % — ABNORMAL HIGH (ref 11.5–15.5)
WBC: 11 10*3/uL — ABNORMAL HIGH (ref 4.0–10.5)
nRBC: 0 % (ref 0.0–0.2)

## 2021-05-30 LAB — MPO/PR-3 (ANCA) ANTIBODIES
ANCA Proteinase 3: 4.5 U/mL — ABNORMAL HIGH (ref 0.0–3.5)
Myeloperoxidase Abs: <9 U/mL (ref 0.0–9.0)

## 2021-05-30 LAB — CYCLIC CITRUL PEPTIDE ANTIBODY, IGG/IGA: CCP Antibodies IgG/IgA: 11 units (ref 0–19)

## 2021-05-30 LAB — RHEUMATOID FACTOR: Rheumatoid fact SerPl-aCnc: 20.8 IU/mL — ABNORMAL HIGH (ref <14.0)

## 2021-05-30 LAB — ANTI-DNA ANTIBODY, DOUBLE-STRANDED: ds DNA Ab: 1 IU/mL (ref 0–9)

## 2021-05-30 LAB — ANCA TITERS
Atypical P-ANCA titer: <1:20 {titer}
C-ANCA: <1:20 {titer}
P-ANCA: <1:20 {titer}

## 2021-05-30 LAB — HIV-1 RNA QUANT-NO REFLEX-BLD
HIV 1 RNA Quant: <20 copies/mL
LOG10 HIV-1 RNA: UNDETERMINED log10copy/mL

## 2021-05-30 LAB — ANGIOTENSIN CONVERTING ENZYME: Angiotensin-Converting Enzyme: 60 U/L (ref 14–82)

## 2021-05-30 LAB — C4 COMPLEMENT: Complement C4, Body Fluid: 42 mg/dL — ABNORMAL HIGH (ref 12–38)

## 2021-05-30 LAB — HEPATITIS B SURFACE ANTIBODY, QUANTITATIVE: Hep B S AB Quant (Post): <3.1 m[IU]/mL — ABNORMAL LOW (ref >9.9)

## 2021-05-30 LAB — C3 COMPLEMENT: C3 Complement: 120 mg/dL (ref 82–167)

## 2021-05-30 MED ORDER — METHYLPREDNISOLONE SODIUM SUCC 125 MG IJ SOLR
45.0000 mg | Freq: Two times a day (BID) | INTRAMUSCULAR | Status: DC
  Administered 2021-05-30 – 2021-05-31 (×2): 45 mg via INTRAVENOUS
  Filled 2021-05-30 (×2): qty 2

## 2021-05-30 MED ORDER — HEPARIN SODIUM (PORCINE) 1000 UNIT/ML DIALYSIS: 6000.0000 [IU] | Freq: Once | INTRAMUSCULAR | Status: AC

## 2021-05-30 MED ORDER — HEPARIN SODIUM (PORCINE) 1000 UNIT/ML IJ SOLN
INTRAMUSCULAR | Status: AC
  Administered 2021-05-30: 6000 [IU] via INTRAVENOUS_CENTRAL
  Filled 2021-05-30: qty 6

## 2021-05-30 MED ORDER — HEPARIN SODIUM (PORCINE) 1000 UNIT/ML IJ SOLN
INTRAMUSCULAR | Status: AC
  Administered 2021-05-30: 3800 [IU] via INTRAVENOUS
  Filled 2021-05-30: qty 4

## 2021-05-30 MED ORDER — HEPARIN SODIUM (PORCINE) 1000 UNIT/ML IJ SOLN: 1000.0000 [IU] | INTRAMUSCULAR | Status: DC | PRN

## 2021-05-30 MED ORDER — HEPARIN SODIUM (PORCINE) 1000 UNIT/ML IJ SOLN: 1000.0000 [IU] | INTRAMUSCULAR | Status: DC

## 2021-05-30 NOTE — Progress Notes (Signed)
Pt arrived to rm 3 from dialysis. Reinitiated tele. VSS. Called bell within reach.   Lavenia Atlas, RN

## 2021-05-30 NOTE — Progress Notes (Signed)
PROGRESS NOTE        PATIENT DETAILS Name: Whitney Walker Age: 65 y.o. Sex: female Date of Birth: 05-04-1956 Admit Date: 05/28/2021 Admitting Physician Karmen Bongo, MD BOF:BPZWC, Doreene Burke, FNP  Brief Narrative: Patient is a 65 y.o. female with history of ESRD on HD MWF, VTE on anticoagulation, HLD, HTN who presented with bilateral painful nodular lower extremity rash, hyperkalemia.  Post admission she started developing fever.   Significant events: 5/30>> admit for eval/work-up of weakness/painful nodular rash in lower extremities, diffuse arthralgias-found to have hypokalemia.   5/30>> fever up to 101 post admission. 6/1>> trial of empiric steroids started.  Significant studies: 5/30>> CXR: No pneumonia. Rheumatoid factor: 20.8 (elevated)  Antimicrobial therapy: None  Microbiology data: 5/30>> COVID/influenza PCR: Negative 5/31>> blood culture:  No growth  Procedures : None  Consults: ID, nephrology, vascular surgery  DVT Prophylaxis : apixaban (ELIQUIS) tablet 2.5 mg Start: 05/28/21 1400 apixaban (ELIQUIS) tablet 2.5 mg    Subjective: Has arthralgias of left ankle, right ankle, and some of the small joints of her left hand.  She still has these tender nodular lesions that seems to have progressed compared to yesterday.  Afebrile last night but was started on NSAIDs yesterday.  Assessment/Plan: Hyperkalemia: Treated with hemodialysis-potassium has now normalized.    Fever/polyarthralgias/nodular lower extremity skin rash (?erythema nodosum): Suspicion for other autoimmune or infectious etiology.  Blood cultures negative so far.  CRP remains significantly elevated, RA factor is elevated as well-rest of autoimmune work-up is pending.  Patient continues to have polyarthralgia/lesions consistent with erythema nodosum-no response to NSAIDs.  Since blood cultures are negative-reasonable to try steroids to see if she will have some symptomatic  relief-we will start IV Solu-Medrol at 1 mg/kg and see how she does.  Stop ibuprofen-as no response.  ESRD on HD MWF: Nephrology following and directing care.  Normocytic anemia: Due to ESRD-defer to nephrology.   HTN: BP controlled-continue Norvasc/Coreg.   HLD: On statin  History of VTE: On Eliquis.  Obesity: Estimated body mass index is 37.78 kg/m as calculated from the following:   Height as of 05/17/21: 5\' 1"  (1.549 m).   Weight as of this encounter: 90.7 kg.    Diet: Diet Order            Diet renal with fluid restriction Fluid restriction: 1200 mL Fluid; Room service appropriate? Yes; Fluid consistency: Thin  Diet effective now                  Code Status: Full code   Family Communication: None at bedside  Disposition Plan: Status is: Observation  The patient will require care spanning > 2 midnights and should be moved to inpatient because: Inpatient level of care appropriate due to severity of illness  Dispo: The patient is from: Home              Anticipated d/c is to: Home              Patient currently is not medically stable to d/c.   Difficult to place patient No    Barriers to Discharge: Unexplained arthralgias/possible erythema nodosum-no response to oral NSAIDs-starting IV Solu-Medrol.  Awaiting extensive autoimmune work-up.  Antimicrobial agents: Anti-infectives (From admission, onward)   None       Time spent: 35 minutes-Greater than 50% of this time was spent in counseling,  explanation of diagnosis, planning of further management, and coordination of care.  MEDICATIONS: Scheduled Meds: . apixaban  2.5 mg Oral BID  . atorvastatin  10 mg Oral Daily  . carvedilol  12.5 mg Oral BID WC  . Chlorhexidine Gluconate Cloth  6 each Topical Q0600  . docusate sodium  100 mg Oral BID  . ferric citrate  420 mg Oral TID with meals  . [START ON 06/01/2021] heparin sodium (porcine)  1,000 Units Intravenous Q M,W,F-HD  . ibuprofen  200 mg Oral Q8H  .  multivitamin  1 tablet Oral QHS  . sodium chloride flush  3 mL Intravenous Q12H   Continuous Infusions: PRN Meds:.acetaminophen **OR** acetaminophen, albuterol, bisacodyl, calcium carbonate (dosed in mg elemental calcium), camphor-menthol **AND** hydrOXYzine, docusate sodium, feeding supplement (NEPRO CARB STEADY), fentaNYL (SUBLIMAZE) injection, hydrALAZINE, ondansetron **OR** ondansetron (ZOFRAN) IV, polyethylene glycol, sorbitol, zolpidem   PHYSICAL EXAM: Vital signs: Vitals:   05/30/21 1000 05/30/21 1030 05/30/21 1109 05/30/21 1146  BP: 118/72 110/64 125/63 (!) 131/100  Pulse: 86 85 83 (!) 106  Resp:   16 14  Temp:   98 F (36.7 C) 98.1 F (36.7 C)  TempSrc:   Oral Oral  SpO2:   96% 91%  Weight:   90.7 kg    Filed Weights   05/29/21 0420 05/30/21 0728 05/30/21 1109  Weight: 91.6 kg 92.9 kg 90.7 kg   Body mass index is 37.78 kg/m.   Gen Exam:Alert awake-not in any distress HEENT:atraumatic, normocephalic Chest: B/L clear to auscultation anteriorly CVS:S1S2 regular Abdomen:soft non tender, non distended Extremities: No nodular lesions in bilateral lower extremities-mostly below knees bilaterally.  Continues to have arthralgias involving left ankle, right ankle. Neurology: Non focal Skin: no rash  I have personally reviewed following labs and imaging studies  LABORATORY DATA: CBC: Recent Labs  Lab 05/28/21 0842 05/29/21 0038 05/30/21 0119  WBC 19.2* 12.7* 11.0*  NEUTROABS 16.9*  --  7.2  HGB 11.6* 10.3* 10.1*  HCT 37.3 32.0* 32.0*  MCV 93.7 90.4 91.2  PLT 216 176 270    Basic Metabolic Panel: Recent Labs  Lab 05/28/21 0842 05/29/21 0038 05/30/21 0119  NA 130* 130* 134*  K 6.9* 3.9 4.3  CL 98 94* 96*  CO2 15* 23 24  GLUCOSE 79 126* 108*  BUN 69* 27* 50*  CREATININE 12.78* 7.54* 9.84*  CALCIUM 9.1 8.6* 9.6  MG 2.0  --   --   PHOS  --   --  6.5*    GFR: Estimated Creatinine Clearance: 5.8 mL/min (A) (by C-G formula based on SCr of 9.84 mg/dL  (H)).  Liver Function Tests: Recent Labs  Lab 05/28/21 0842 05/30/21 0119  AST 20  --   ALT 16  --   ALKPHOS 111  --   BILITOT 0.3  --   PROT 7.2  --   ALBUMIN 2.7* 2.5*   No results for input(s): LIPASE, AMYLASE in the last 168 hours. No results for input(s): AMMONIA in the last 168 hours.  Coagulation Profile: Recent Labs  Lab 05/28/21 0842  INR 1.2    Cardiac Enzymes: No results for input(s): CKTOTAL, CKMB, CKMBINDEX, TROPONINI in the last 168 hours.  BNP (last 3 results) No results for input(s): PROBNP in the last 8760 hours.  Lipid Profile: No results for input(s): CHOL, HDL, LDLCALC, TRIG, CHOLHDL, LDLDIRECT in the last 72 hours.  Thyroid Function Tests: No results for input(s): TSH, T4TOTAL, FREET4, T3FREE, THYROIDAB in the last 72 hours.  Anemia Panel:  No results for input(s): VITAMINB12, FOLATE, FERRITIN, TIBC, IRON, RETICCTPCT in the last 72 hours.  Urine analysis:    Component Value Date/Time   COLORURINE YELLOW 03/02/2019 1416   APPEARANCEUR CLEAR 03/02/2019 1416   LABSPEC 1.010 03/02/2019 1416   PHURINE 6.0 03/02/2019 1416   GLUCOSEU 50 (A) 03/02/2019 1416   HGBUR SMALL (A) 03/02/2019 1416   BILIRUBINUR NEGATIVE 03/02/2019 1416   KETONESUR 5 (A) 03/02/2019 1416   PROTEINUR 100 (A) 03/02/2019 1416   UROBILINOGEN 0.2 10/19/2015 1532   NITRITE NEGATIVE 03/02/2019 1416   LEUKOCYTESUR NEGATIVE 03/02/2019 1416    Sepsis Labs: Lactic Acid, Venous    Component Value Date/Time   LATICACIDVEN 1.7 10/30/2020 0407    MICROBIOLOGY: Recent Results (from the past 240 hour(s))  Resp Panel by RT-PCR (Flu A&B, Covid) Nasopharyngeal Swab     Status: None   Collection Time: 05/28/21  8:44 AM   Specimen: Nasopharyngeal Swab; Nasopharyngeal(NP) swabs in vial transport medium  Result Value Ref Range Status   SARS Coronavirus 2 by RT PCR NEGATIVE NEGATIVE Final    Comment: (NOTE) SARS-CoV-2 target nucleic acids are NOT DETECTED.  The SARS-CoV-2 RNA is  generally detectable in upper respiratory specimens during the acute phase of infection. The lowest concentration of SARS-CoV-2 viral copies this assay can detect is 138 copies/mL. A negative result does not preclude SARS-Cov-2 infection and should not be used as the sole basis for treatment or other patient management decisions. A negative result may occur with  improper specimen collection/handling, submission of specimen other than nasopharyngeal swab, presence of viral mutation(s) within the areas targeted by this assay, and inadequate number of viral copies(<138 copies/mL). A negative result must be combined with clinical observations, patient history, and epidemiological information. The expected result is Negative.  Fact Sheet for Patients:  EntrepreneurPulse.com.au  Fact Sheet for Healthcare Providers:  IncredibleEmployment.be  This test is no t yet approved or cleared by the [place] FDA and  has been authorized for detection and/or diagnosis of SARS-CoV-2 by FDA under an Emergency Use Authorization (EUA). This EUA will remain  in effect (meaning this test can be used) for the duration of the COVID-19 declaration under Section 564(b)(1) of the Act, 21 U.S.C.section 360bbb-3(b)(1), unless the authorization is terminated  or revoked sooner.       Influenza A by PCR NEGATIVE NEGATIVE Final   Influenza B by PCR NEGATIVE NEGATIVE Final    Comment: (NOTE) The Xpert Xpress SARS-CoV-2/FLU/RSV plus assay is intended as an aid in the diagnosis of influenza from Nasopharyngeal swab specimens and should not be used as a sole basis for treatment. Nasal washings and aspirates are unacceptable for Xpert Xpress SARS-CoV-2/FLU/RSV testing.  Fact Sheet for Patients: EntrepreneurPulse.com.au  Fact Sheet for Healthcare Providers: IncredibleEmployment.be  This test is not yet approved or cleared by the [place] FDA and has been authorized for detection and/or diagnosis of SARS-CoV-2 by FDA under an Emergency Use Authorization (EUA). This EUA will remain in effect (meaning this test can be used) for the duration of the COVID-19 declaration under Section 564(b)(1) of the Act, 21 U.S.C. section 360bbb-3(b)(1), unless the authorization is terminated or revoked.  Performed at [facility], [place] [address], [place], [place] [address]   Culture, blood (routine x 2)     Status: None (Preliminary result)   Collection Time: 05/29/21  8:28 AM   Specimen: BLOOD  Result Value Ref Range Status   Specimen Description BLOOD LEFT ANTECUBITAL  Final  Special Requests   Final    BOTTLES DRAWN AEROBIC AND ANAEROBIC Blood Culture adequate volume   Culture   Final    NO GROWTH < 24 HOURS Performed at [facility], [place] [address] [place], [place] [address]    Report Status PENDING  Incomplete  Culture, blood (routine x 2)     Status: None (Preliminary result)   Collection Time: 05/29/21  9:30 AM   Specimen: BLOOD LEFT FOREARM  Result Value Ref Range Status   Specimen Description BLOOD LEFT FOREARM  Final   Special Requests   Final    BOTTLES DRAWN AEROBIC AND ANAEROBIC Blood Culture results may not be optimal due to an inadequate volume of blood received in culture bottles   Culture   Final    NO GROWTH < 24 HOURS Performed at [facility], [place] [address] [place], [place] [address]    Report Status PENDING  Incomplete    RADIOLOGY STUDIES/RESULTS: No results found.   LOS: 0 days   Oren Binet, MD  [facility]    To contact the attending provider between 7A-7P or the covering provider during after hours 7P-7A, please log into the web site [email or web address] and access using universal [place] password for that web site. If you do not have the password, please call the hospital operator.  05/30/2021, 1:18 PM

## 2021-05-30 NOTE — Progress Notes (Signed)
PT Cancellation Note  Patient Details Name: Whitney Walker MRN: [id number] DOB: [birth year]   Cancelled Treatment:    Reason Eval/Treat Not Completed: (P) Other (comment) (Pt spent >10 minutes describing BLE pain (superficial/skin-level) and requesting topical medication for pain (does not want oral pain meds). Pt refusing PT tx after discussion.) Pt reports she does not need PT, reviewed importance of mobility and fall risk prevention, use of call bell as pt reporting 10/10 pain and encouraged her to get help with mobility when pain is this bad. Reviewed stair sequencing and progression of activity, pt states she does not need to perform stairs to get into home and is "OK" to walk around hospital room herself. Pt RN notified of discussion/pain score and pt bed alarm not on, pt not wanting it activated by PTA at this time.   Kara Pacer [name] 05/30/2021, 4:38 PM

## 2021-05-30 NOTE — Progress Notes (Signed)
[facility] Progress Note  Subjective: pt seen on HD. Blood cx'd neg to date.  Fevers resolved. WBC 11 range.   Vitals:   05/30/21 0900 05/30/21 0930 05/30/21 0942 05/30/21 1000  BP: (!) 119/58 (!) 70/38 130/65 118/72  Pulse: 84 70 84 86  Resp: 18     Temp:      TempSrc:      SpO2:      Weight:        Exam:   alert, nad   no jvd  Chest cta bilat  Cor reg no RG  Abd soft ntnd no ascites   Ext no LE edema, scattered light red skin lesions bilat lower legs   Alert, NF, ox3    R IJ TDC w/ mild purulent exudate, no erythema     [facility]: [facility]   3.5h  400/500  2/2 bath  P2  TDC RIJ  90kg  Hep 6000  - no esa/ vdra  - home renal = auryxia 3 ac tid/ norvasc 5/ coreg 12.5 bid/ renavite qd/ fosrenol 1gm w/ each meal and snack   Assessment/ Plan: 1. Fever/ LE rash/ weakness: Scattered erythematous lesions w/ fevers and gen weakness/ ^WBC count. Blood cx's negative. Pt reports hx of "crusting" at the [facility] exit site off and on for the last 3-27mos. Sent swab from the exit site today 6/01 to the lab for culture. Exit site has mild purulent exudate w/o erythema/ skin changes. Await culture and/or consider empiric IV abx for 1-2 wks. ID on board.  2.  ESRD: HD MWF. HD today.  3. HTN/ volume: BP soft, on norvasc and coreg. Will dc norvasc. Close to dry wt, no vol^ on exam.  4.  Anemia ckd: Hgb 11.6, no ESA indicated at this time.  5.  Metabolic bone disease: Corrected calcium elevated. Not on VDRA outpatient. Continue low calcium bath with HD. Resume phosphorus binders once tolerating PO intake. 6. Nutrition:  Renal diet (low K+, low phos) very important due to tendency towards hyperkalemia. Will need protein supplementation once tolerating po.      Rob Shatina Streets 05/30/2021, 10:05 AM   Recent Labs  Lab 05/29/21 0038 05/30/21 0119  K 3.9 4.3  BUN 27* 50*  CREATININE 7.54* 9.84*  CALCIUM 8.6* 9.6  PHOS  --  6.5*  HGB 10.3* 10.1*   Inpatient medications: . heparin  sodium (porcine)      . apixaban  2.5 mg Oral BID  . atorvastatin  10 mg Oral Daily  . carvedilol  12.5 mg Oral BID WC  . Chlorhexidine Gluconate Cloth  6 each Topical Q0600  . docusate sodium  100 mg Oral BID  . ferric citrate  420 mg Oral TID with meals  . ibuprofen  200 mg Oral Q8H  . multivitamin  1 tablet Oral QHS  . sodium chloride flush  3 mL Intravenous Q12H    acetaminophen **OR** acetaminophen, albuterol, bisacodyl, calcium carbonate (dosed in mg elemental calcium), camphor-menthol **AND** hydrOXYzine, docusate sodium, feeding supplement (NEPRO CARB STEADY), fentaNYL (SUBLIMAZE) injection, heparin sodium (porcine), hydrALAZINE, ondansetron **OR** ondansetron (ZOFRAN) IV, polyethylene glycol, sorbitol, zolpidem

## 2021-05-30 NOTE — Progress Notes (Addendum)
Pt requested of changing from renal diet to different type of diet. RN explained the importance of renal diet due to she on dialysis. Pt requested leaving AMA due to renal diet. MD notified.   updated: heart healthy diet put in per MD verbal order.    Lavenia Atlas, RN

## 2021-05-30 NOTE — Progress Notes (Signed)
Subjective: Complaining of increasing Whitney Walker painful lesions on her legs and now ankle pain   Antibiotics:  Anti-infectives (From admission, onward)   None      Medications: Scheduled Meds: . apixaban  2.5 mg Oral BID  . atorvastatin  10 mg Oral Daily  . carvedilol  12.5 mg Oral BID WC  . Chlorhexidine Gluconate Cloth  6 each Topical Q0600  . docusate sodium  100 mg Oral BID  . ferric citrate  420 mg Oral TID with meals  . [START ON 06/01/2021] heparin sodium (porcine)  1,000 Units Intravenous Q M,W,F-HD  . ibuprofen  200 mg Oral Q8H  . multivitamin  1 tablet Oral QHS  . sodium chloride flush  3 mL Intravenous Q12H   Continuous Infusions: PRN Meds:.acetaminophen **OR** acetaminophen, albuterol, bisacodyl, calcium carbonate (dosed in mg elemental calcium), camphor-menthol **AND** hydrOXYzine, docusate sodium, feeding supplement (NEPRO CARB STEADY), fentaNYL (SUBLIMAZE) injection, hydrALAZINE, ondansetron **OR** ondansetron (ZOFRAN) IV, polyethylene glycol, sorbitol, zolpidem    Objective: Weight change:   Intake/Output Summary (Last 24 hours) at 05/30/2021 1204 Last data filed at 05/30/2021 1109 Gross per 24 hour  Intake --  Output 2000 ml  Net -2000 ml   Blood pressure (!) 131/100, pulse (!) 106, temperature 98.1 F (36.7 C), temperature source Oral, resp. rate 14, weight 90.7 kg, SpO2 91 %. Temp:  [97.7 F (36.5 C)-98.2 F (36.8 C)] 98.1 F (36.7 C) (06/01 1146) Pulse Rate:  [70-106] 106 (06/01 1146) Resp:  [13-19] 14 (06/01 1146) BP: (70-146)/(38-100) 131/100 (06/01 1146) SpO2:  [91 %-96 %] 91 % (06/01 1146) Weight:  [90.7 kg-92.9 kg] 90.7 kg (06/01 1109)  Physical Exam: Physical Exam Constitutional:      General: She is not in acute distress.    Appearance: She is well-developed. She is not diaphoretic.  HENT:     Head: Normocephalic and atraumatic.     Right Ear: External ear normal.     Left Ear: External ear normal.     Mouth/Throat:      Pharynx: No oropharyngeal exudate.  Eyes:     General: No scleral icterus.    Extraocular Movements: Extraocular movements intact.     Conjunctiva/sclera: Conjunctivae normal.  Cardiovascular:     Rate and Rhythm: Normal rate and regular rhythm.  Pulmonary:     Effort: Pulmonary effort is normal. No respiratory distress.     Breath sounds: No wheezing or rales.  Abdominal:     General: There is no distension.     Palpations: Abdomen is soft.     Tenderness: There is no abdominal tenderness. There is no rebound.  Musculoskeletal:        General: Normal range of motion.     Left shoulder: Normal.     Left ankle: Tenderness present.     Comments: Some tenderness of her middle finger  Lymphadenopathy:     Cervical: No cervical adenopathy.  Skin:    General: Skin is warm and dry.     Coloration: Skin is not pale.     Findings: No erythema or rash.  Neurological:     Mental Status: She is alert and oriented to person, place, and time.     Motor: No abnormal muscle tone.     Coordination: Coordination normal.  Psychiatric:        Attention and Perception: Attention normal.        Mood and Affect: Mood is anxious.  Behavior: Behavior normal.        Thought Content: Thought content normal.        Judgment: Judgment normal.   Catheter did not look overtly infected when examined it but she does have a Biopatch over the entrance site  diffuse rash on her legs May 30, 2021:      CBC:    BMET Recent Labs    05/29/21 0038 05/30/21 0119  NA 130* 134*  K 3.9 4.3  CL 94* 96*  CO2 23 24  GLUCOSE 126* 108*  BUN 27* 50*  CREATININE 7.54* 9.84*  CALCIUM 8.6* 9.6     Liver Panel  Recent Labs    05/28/21 0842 05/30/21 0119  PROT 7.2  --   ALBUMIN 2.7* 2.5*  AST 20  --   ALT 16  --   ALKPHOS 111  --   BILITOT 0.3  --        Sedimentation Rate Recent Labs    05/29/21 1239  ESRSEDRATE 98*   C-Reactive Protein Recent Labs    05/28/21 0842  05/29/21 1239  CRP 16.6* 25.5*    Micro Results: Recent Results (from the past 720 hour(s))  SARS Coronavirus 2 by RT PCR (hospital order, performed in [facility] hospital lab) Nasopharyngeal Nasopharyngeal Swab     Status: None   Collection Time: 05/01/21  7:52 AM   Specimen: Nasopharyngeal Swab  Result Value Ref Range Status   SARS Coronavirus 2 NEGATIVE NEGATIVE Final    Comment: (NOTE) SARS-CoV-2 target nucleic acids are NOT DETECTED.  The SARS-CoV-2 RNA is generally detectable in upper and lower respiratory specimens during the acute phase of infection. The lowest concentration of SARS-CoV-2 viral copies this assay can detect is 250 copies / mL. A negative result does not preclude SARS-CoV-2 infection and should not be used as the sole basis for treatment or other patient management decisions.  A negative result may occur with improper specimen collection / handling, submission of specimen other than nasopharyngeal swab, presence of viral mutation(s) within the areas targeted by this assay, and inadequate number of viral copies (<250 copies / mL). A negative result must be combined with clinical observations, patient history, and epidemiological information.  Fact Sheet for Patients:   StrictlyIdeas.no  Fact Sheet for Healthcare Providers: BankingDealers.co.za  This test is not yet approved or  cleared by the [place] FDA and has been authorized for detection and/or diagnosis of SARS-CoV-2 by FDA under an Emergency Use Authorization (EUA).  This EUA will remain in effect (meaning this test can be used) for the duration of the COVID-19 declaration under Section 564(b)(1) of the Act, 21 U.S.C. section 360bbb-3(b)(1), unless the authorization is terminated or revoked sooner.  Performed at [facility], [place] [address] [place], [place] [address]   Resp Panel by RT-PCR (Flu A&B, Covid) Nasopharyngeal Swab      Status: None   Collection Time: 05/28/21  8:44 AM   Specimen: Nasopharyngeal Swab; Nasopharyngeal(NP) swabs in vial transport medium  Result Value Ref Range Status   SARS Coronavirus 2 by RT PCR NEGATIVE NEGATIVE Final    Comment: (NOTE) SARS-CoV-2 target nucleic acids are NOT DETECTED.  The SARS-CoV-2 RNA is generally detectable in upper respiratory specimens during the acute phase of infection. The lowest concentration of SARS-CoV-2 viral copies this assay can detect is 138 copies/mL. A negative result does not preclude SARS-Cov-2 infection and should not be used as the sole basis for treatment or other patient management  decisions. A negative result may occur with  improper specimen collection/handling, submission of specimen other than nasopharyngeal swab, presence of viral mutation(s) within the areas targeted by this assay, and inadequate number of viral copies(<138 copies/mL). A negative result must be combined with clinical observations, patient history, and epidemiological information. The expected result is Negative.  Fact Sheet for Patients:  EntrepreneurPulse.com.au  Fact Sheet for Healthcare Providers:  IncredibleEmployment.be  This test is no t yet approved or cleared by the [place] FDA and  has been authorized for detection and/or diagnosis of SARS-CoV-2 by FDA under an Emergency Use Authorization (EUA). This EUA will remain  in effect (meaning this test can be used) for the duration of the COVID-19 declaration under Section 564(b)(1) of the Act, 21 U.S.C.section 360bbb-3(b)(1), unless the authorization is terminated  or revoked sooner.       Influenza A by PCR NEGATIVE NEGATIVE Final   Influenza B by PCR NEGATIVE NEGATIVE Final    Comment: (NOTE) The Xpert Xpress SARS-CoV-2/FLU/RSV plus assay is intended as an aid in the diagnosis of influenza from Nasopharyngeal swab specimens and should not be used as a sole basis  for treatment. Nasal washings and aspirates are unacceptable for Xpert Xpress SARS-CoV-2/FLU/RSV testing.  Fact Sheet for Patients: EntrepreneurPulse.com.au  Fact Sheet for Healthcare Providers: IncredibleEmployment.be  This test is not yet approved or cleared by the [place] FDA and has been authorized for detection and/or diagnosis of SARS-CoV-2 by FDA under an Emergency Use Authorization (EUA). This EUA will remain in effect (meaning this test can be used) for the duration of the COVID-19 declaration under Section 564(b)(1) of the Act, 21 U.S.C. section 360bbb-3(b)(1), unless the authorization is terminated or revoked.  Performed at [facility], [place] [address], [place], [place] [address]   Culture, blood (routine x 2)     Status: None (Preliminary result)   Collection Time: 05/29/21  8:28 AM   Specimen: BLOOD  Result Value Ref Range Status   Specimen Description BLOOD LEFT ANTECUBITAL  Final   Special Requests   Final    BOTTLES DRAWN AEROBIC AND ANAEROBIC Blood Culture adequate volume   Culture   Final    NO GROWTH < 24 HOURS Performed at [facility], [place] [address], [place], [place] [address]    Report Status PENDING  Incomplete  Culture, blood (routine x 2)     Status: None (Preliminary result)   Collection Time: 05/29/21  9:30 AM   Specimen: BLOOD LEFT FOREARM  Result Value Ref Range Status   Specimen Description BLOOD LEFT FOREARM  Final   Special Requests   Final    BOTTLES DRAWN AEROBIC AND ANAEROBIC Blood Culture results may not be optimal due to an inadequate volume of blood received in culture bottles   Culture   Final    NO GROWTH < 24 HOURS Performed at [facility], [place] [address], [place], [place] [address]    Report Status PENDING  Incomplete    Studies/Results: No results found.    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 female  with  with multiple medical problems including peripheral vascular disease end-stage renal disease on hemodialysis history of deep venous thrombosis and pulmonary embolism coronary artery disease COPD colon cancer who reported that Friday dialysis she began having a painful arthritis that started in her hands along with painful skin lesions that emerged on her legs.  The arthritis migrated to various other parts of her body including her left shoulder and now in her left ankle and right ankle the rash has also worsened and is quite painful to the touch.  Also had a fever up on admission  She states had noted that she was recently started on omeprazole on 19 May which was a new drug for her.  This is was seen as a possible culprit for her symptoms.  She reported that there was some crusting from her dialysis catheter present for the past 4 months but that nothing ever grows on culture.  On admission she has had blood cultures taken which so far not growing any organisms.  Her rash certainly could be consistent with erythema nodosum as it is quite painful.  Her migrating polyarthritis is not consistent with a septic polyarthritis though she could be reacting to an infection elsewhere such as her HD catheter.  Her hepatitis C antibody has been positive in the past but RNA was negative when last checked.  HIV was negative  ACE is negative   rheumatoid factor appears positive at 20.8  I will check a few other viral serologies including CMV, EBV.  I suspect that unless she has a systemic infection that this is more likely a manifestation of an autoimmune disease  and that she will benefit from corticosteroid therapy  I spent greater than 35 minutes with the patient including greater than 50% of time in face to face counsel of the patient and reviewing her labs, personally reviewing her radiographs microbiological data and in coordination of her care with Dr. Sloan Leiter.       LOS: 0 days   Alcide Evener 05/30/2021, 12:04 PM

## 2021-05-31 DIAGNOSIS — E875 Hyperkalemia: Secondary | ICD-10-CM | POA: Diagnosis not present

## 2021-05-31 DIAGNOSIS — R21 Rash and other nonspecific skin eruption: Secondary | ICD-10-CM | POA: Diagnosis not present

## 2021-05-31 DIAGNOSIS — I10 Essential (primary) hypertension: Secondary | ICD-10-CM | POA: Diagnosis not present

## 2021-05-31 DIAGNOSIS — L52 Erythema nodosum: Secondary | ICD-10-CM | POA: Diagnosis not present

## 2021-05-31 DIAGNOSIS — R509 Fever, unspecified: Secondary | ICD-10-CM | POA: Diagnosis not present

## 2021-05-31 DIAGNOSIS — R531 Weakness: Secondary | ICD-10-CM | POA: Diagnosis not present

## 2021-05-31 DIAGNOSIS — N186 End stage renal disease: Secondary | ICD-10-CM | POA: Diagnosis not present

## 2021-05-31 DIAGNOSIS — M130 Polyarthritis, unspecified: Secondary | ICD-10-CM | POA: Diagnosis not present

## 2021-05-31 DIAGNOSIS — Z992 Dependence on renal dialysis: Secondary | ICD-10-CM | POA: Diagnosis not present

## 2021-05-31 DIAGNOSIS — M2550 Pain in unspecified joint: Secondary | ICD-10-CM | POA: Diagnosis not present

## 2021-05-31 LAB — PROTEIN ELECTROPHORESIS, SERUM
A/G Ratio: 0.8 (ref 0.7–1.7)
Albumin ELP: 3 g/dL (ref 2.9–4.4)
Alpha-1-Globulin: 0.4 g/dL (ref 0.0–0.4)
Alpha-2-Globulin: 1 g/dL (ref 0.4–1.0)
Beta Globulin: 0.9 g/dL (ref 0.7–1.3)
Gamma Globulin: 1.3 g/dL (ref 0.4–1.8)
Globulin, Total: 3.6 g/dL (ref 2.2–3.9)
Total Protein ELP: 6.6 g/dL (ref 6.0–8.5)

## 2021-05-31 LAB — RENAL FUNCTION PANEL
Albumin: 2.6 g/dL — ABNORMAL LOW (ref 3.5–5.0)
Anion gap: 13 (ref 5–15)
BUN: 31 mg/dL — ABNORMAL HIGH (ref 8–23)
CO2: 24 mmol/L (ref 22–32)
Calcium: 9.7 mg/dL (ref 8.9–10.3)
Chloride: 95 mmol/L — ABNORMAL LOW (ref 98–111)
Creatinine, Ser: 5.98 mg/dL — ABNORMAL HIGH (ref 0.44–1.00)
GFR, Estimated: 7 mL/min — ABNORMAL LOW (ref >60)
Glucose, Bld: 249 mg/dL — ABNORMAL HIGH (ref 70–99)
Phosphorus: 2.7 mg/dL (ref 2.5–4.6)
Potassium: 4.5 mmol/L (ref 3.5–5.1)
Sodium: 132 mmol/L — ABNORMAL LOW (ref 135–145)

## 2021-05-31 LAB — HCV RNA QUANT RFLX ULTRA OR GENOTYP
HCV RNA Qnt(log copy/mL): UNDETERMINED log10 IU/mL
HepC Qn: NOT DETECTED IU/mL

## 2021-05-31 LAB — CBC
HCT: 32.7 % — ABNORMAL LOW (ref 36.0–46.0)
Hemoglobin: 10.6 g/dL — ABNORMAL LOW (ref 12.0–15.0)
MCH: 29.3 pg (ref 26.0–34.0)
MCHC: 32.4 g/dL (ref 30.0–36.0)
MCV: 90.3 fL (ref 80.0–100.0)
Platelets: 186 10*3/uL (ref 150–400)
RBC: 3.62 MIL/uL — ABNORMAL LOW (ref 3.87–5.11)
RDW: 15.9 % — ABNORMAL HIGH (ref 11.5–15.5)
WBC: 9.9 10*3/uL (ref 4.0–10.5)
nRBC: 0 % (ref 0.0–0.2)

## 2021-05-31 LAB — PARVOVIRUS B19 ANTIBODY, IGG AND IGM
Parovirus B19 IgG Abs: 3.1 index — ABNORMAL HIGH (ref 0.0–0.8)
Parovirus B19 IgM Abs: 0.1 index (ref 0.0–0.8)

## 2021-05-31 MED ORDER — PREDNISONE 10 MG PO TABS: ORAL_TABLET | ORAL | 0 refills | Status: DC

## 2021-05-31 NOTE — Consult Note (Signed)
   [facility] Inpatient Consult   05/31/2021  Jenicka Coxe 08/30/1956 [id number]   [facility] [[facility]] Patient: [employer]  Patient is active in an Warden/ranger for chronic care management Embedded Care Management team.  Plan: Notification sent to the [place] Management and made aware of transition back home.  Please contact for further questions,  Natividad Brood, RN BSN [facility] Liaison  [phone] business mobile phone Toll free office [phone]  Fax number: [phone] [email or web address]

## 2021-05-31 NOTE — Progress Notes (Signed)
She is requesting discharge-she has multiple compaints-doesn't like hospital food, blood draws, doesn't want skin biopsy-feels better and wants to leave the hospital today-claims her children are all sick and that she cant just stay here. Discussed with renal-they will send for outpatient rheum referral. Will plan on discharging with tapeing steroids-until seen by outpatient rhem.

## 2021-05-31 NOTE — Progress Notes (Signed)
Subjective: She wants to go home today and very frustrated with multiple issues   Antibiotics:  Anti-infectives (From admission, onward)   None      Medications: Scheduled Meds: . apixaban  2.5 mg Oral BID  . atorvastatin  10 mg Oral Daily  . carvedilol  12.5 mg Oral BID WC  . Chlorhexidine Gluconate Cloth  6 each Topical Q0600  . docusate sodium  100 mg Oral BID  . ferric citrate  420 mg Oral TID with meals  . [START ON 06/01/2021] heparin sodium (porcine)  1,000 Units Intravenous Q M,W,F-HD  . methylPREDNISolone (SOLU-MEDROL) injection  45 mg Intravenous Q12H  . multivitamin  1 tablet Oral QHS  . sodium chloride flush  3 mL Intravenous Q12H   Continuous Infusions: PRN Meds:.acetaminophen **OR** acetaminophen, albuterol, bisacodyl, calcium carbonate (dosed in mg elemental calcium), camphor-menthol **AND** hydrOXYzine, docusate sodium, feeding supplement (NEPRO CARB STEADY), fentaNYL (SUBLIMAZE) injection, hydrALAZINE, ondansetron **OR** ondansetron (ZOFRAN) IV, polyethylene glycol, sorbitol, zolpidem    Objective: Weight change:   Intake/Output Summary (Last 24 hours) at 05/31/2021 1140 Last data filed at 05/31/2021 0830 Gross per 24 hour  Intake 360 ml  Output --  Net 360 ml   Blood pressure (!) 146/105, pulse (!) 103, temperature 98.3 F (36.8 C), temperature source Oral, resp. rate 17, weight 90.7 kg, SpO2 93 %. Temp:  [98.1 F (36.7 C)-98.9 F (37.2 C)] 98.3 F (36.8 C) (06/02 0742) Pulse Rate:  [80-106] 103 (06/02 0742) Resp:  [14-24] 17 (06/02 0742) BP: (118-158)/(71-105) 146/105 (06/02 0742) SpO2:  [91 %-96 %] 93 % (06/02 0742)  Physical Exam: Physical Exam Constitutional:      General: She is not in acute distress.    Appearance: She is well-developed. She is not diaphoretic.  HENT:     Head: Normocephalic and atraumatic.     Right Ear: External ear normal.     Left Ear: External ear normal.     Mouth/Throat:     Pharynx: No oropharyngeal  exudate.  Eyes:     General: No scleral icterus.    Extraocular Movements: Extraocular movements intact.     Conjunctiva/sclera: Conjunctivae normal.  Cardiovascular:     Rate and Rhythm: Normal rate and regular rhythm.  Pulmonary:     Effort: Pulmonary effort is normal. No respiratory distress.     Breath sounds: No wheezing or rales.  Abdominal:     General: There is no distension.     Palpations: Abdomen is soft.     Tenderness: There is no abdominal tenderness. There is no rebound.  Musculoskeletal:        General: Normal range of motion.     Left shoulder: Normal.     Left ankle: Tenderness present.     Comments: Some tenderness of her middle finger  Lymphadenopathy:     Cervical: No cervical adenopathy.  Skin:    General: Skin is warm and dry.     Coloration: Skin is not pale.     Findings: No erythema or rash.  Neurological:     Mental Status: She is alert and oriented to person, place, and time.     Motor: No abnormal muscle tone.     Coordination: Coordination normal.  Psychiatric:        Attention and Perception: Attention normal.        Mood and Affect: Mood is anxious.        Behavior: Behavior normal.  Thought Content: Thought content normal.        Judgment: Judgment normal.     Ankle slightly tender  Catheter did not look overtly infected   diffuse rash on her legs May 30, 2021:     Legs today 05/31/2021:      CBC:    BMET Recent Labs    05/30/21 0119 05/31/21 0227  NA 134* 132*  K 4.3 4.5  CL 96* 95*  CO2 24 24  GLUCOSE 108* 249*  BUN 50* 31*  CREATININE 9.84* 5.98*  CALCIUM 9.6 9.7     Liver Panel  Recent Labs    05/30/21 0119 05/31/21 0227  ALBUMIN 2.5* 2.6*       Sedimentation Rate Recent Labs    05/29/21 1239  ESRSEDRATE 98*   C-Reactive Protein Recent Labs    05/29/21 1239  CRP 25.5*    Micro Results: Recent Results (from the past 720 hour(s))  Resp Panel by RT-PCR (Flu A&B, Covid)  Nasopharyngeal Swab     Status: None   Collection Time: 05/28/21  8:44 AM   Specimen: Nasopharyngeal Swab; Nasopharyngeal(NP) swabs in vial transport medium  Result Value Ref Range Status   SARS Coronavirus 2 by RT PCR NEGATIVE NEGATIVE Final    Comment: (NOTE) SARS-CoV-2 target nucleic acids are NOT DETECTED.  The SARS-CoV-2 RNA is generally detectable in upper respiratory specimens during the acute phase of infection. The lowest concentration of SARS-CoV-2 viral copies this assay can detect is 138 copies/mL. A negative result does not preclude SARS-Cov-2 infection and should not be used as the sole basis for treatment or other patient management decisions. A negative result may occur with  improper specimen collection/handling, submission of specimen other than nasopharyngeal swab, presence of viral mutation(s) within the areas targeted by this assay, and inadequate number of viral copies(<138 copies/mL). A negative result must be combined with clinical observations, patient history, and epidemiological information. The expected result is Negative.  Fact Sheet for Patients:  EntrepreneurPulse.com.au  Fact Sheet for Healthcare Providers:  IncredibleEmployment.be  This test is no t yet approved or cleared by the [place] FDA and  has been authorized for detection and/or diagnosis of SARS-CoV-2 by FDA under an Emergency Use Authorization (EUA). This EUA will remain  in effect (meaning this test can be used) for the duration of the COVID-19 declaration under Section 564(b)(1) of the Act, 21 U.S.C.section 360bbb-3(b)(1), unless the authorization is terminated  or revoked sooner.       Influenza A by PCR NEGATIVE NEGATIVE Final   Influenza B by PCR NEGATIVE NEGATIVE Final    Comment: (NOTE) The Xpert Xpress SARS-CoV-2/FLU/RSV plus assay is intended as an aid in the diagnosis of influenza from Nasopharyngeal swab specimens and should not be  used as a sole basis for treatment. Nasal washings and aspirates are unacceptable for Xpert Xpress SARS-CoV-2/FLU/RSV testing.  Fact Sheet for Patients: EntrepreneurPulse.com.au  Fact Sheet for Healthcare Providers: IncredibleEmployment.be  This test is not yet approved or cleared by the [place] FDA and has been authorized for detection and/or diagnosis of SARS-CoV-2 by FDA under an Emergency Use Authorization (EUA). This EUA will remain in effect (meaning this test can be used) for the duration of the COVID-19 declaration under Section 564(b)(1) of the Act, 21 U.S.C. section 360bbb-3(b)(1), unless the authorization is terminated or revoked.  Performed at [facility], [place] [address], [place], [place] [address]   Culture, blood (routine x 2)     Status: None (  Preliminary result)   Collection Time: 05/29/21  8:28 AM   Specimen: BLOOD  Result Value Ref Range Status   Specimen Description BLOOD LEFT ANTECUBITAL  Final   Special Requests   Final    BOTTLES DRAWN AEROBIC AND ANAEROBIC Blood Culture adequate volume   Culture   Final    NO GROWTH 2 DAYS Performed at [facility], [address] [place], [place] [address]    Report Status PENDING  Incomplete  Culture, blood (routine x 2)     Status: None (Preliminary result)   Collection Time: 05/29/21  9:30 AM   Specimen: BLOOD LEFT FOREARM  Result Value Ref Range Status   Specimen Description BLOOD LEFT FOREARM  Final   Special Requests   Final    BOTTLES DRAWN AEROBIC AND ANAEROBIC Blood Culture results may not be optimal due to an inadequate volume of blood received in culture bottles   Culture   Final    NO GROWTH 2 DAYS Performed at [facility], [place] [address], [place], [place] [address]    Report Status PENDING  Incomplete  Aerobic Culture w Gram Stain (superficial specimen)     Status: None (Preliminary result)   Collection Time: 05/30/21 10:48 AM   Specimen:  Cath Site; Wound  Result Value Ref Range Status   Specimen Description CATH SITE  Final   Special Requests NONE  Final   Gram Stain   Final    FEW WBC PRESENT,BOTH PMN AND MONONUCLEAR RARE GRAM POSITIVE COCCI    Culture   Final    FEW STAPHYLOCOCCUS AUREUS SUSCEPTIBILITIES TO FOLLOW Performed at [facility], [place] [address], [place], [place] [address]    Report Status PENDING  Incomplete    Studies/Results: No results found.    Assessment/Plan:  INTERVAL HISTORY:   Arthritic ssx better, rash less tender post steroids  cx from area around catheter = staphylococcus aureus   Principal Problem:   Hyperkalemia, diminished renal excretion Active Problems:   Essential hypertension   ESRD on dialysis ([place])   Generalized weakness   Rash and nonspecific skin eruption   FUO (fever of unknown origin)   Polyarthritis   Polyarthralgia    Arneda Sappington is a 65 y.o. female with  with multiple medical problems including peripheral vascular disease end-stage renal disease on hemodialysis history of deep venous thrombosis and pulmonary embolism coronary artery disease COPD colon cancer who reported that Friday dialysis she began having a painful arthritis that started in her hands along with painful skin lesions that emerged on her legs.  The arthritis migrated to various other parts of her body including her left shoulder and now in her left ankle and right ankle the rash has also worsened and is quite painful to the touch.  Also had a fever up on admission  She states had noted that she was recently started on omeprazole on 19 May which was a new drug for her.  This is was seen as a possible culprit for her symptoms.  She reported that there was some crusting from her dialysis catheter present for the past 4 months but that nothing ever grows on culture.  On admission she has had blood cultures taken which so far not growing any organisms.  #1 Polyarthritis and E  nodosum like rash  Her rash certainly could be consistent with erythema nodosum as it is quite painful.  Her migrating polyarthritis is not consistent with a septic polyarthritis though  she could be reacting to an infection elsewhere such as her HD catheter.  Her hepatitis C antibody has been positive in the past but RNA was negative when last checked.  HIV was negative  ACE is negative   rheumatoid factor appears positive at 20.8  I sent including CMV, EBV.  She seems improved on steroids  #2 Crusting around HD catheter with staph aureus  Dr. Jonnie Finner wishes to give her 2 weeks of abx and could give vancomycin with HD to accomplish this  I personally would feel better if the HD catheter were removed at some point  She is leaving today  I spent greater than 35 minutes with the patient including greater than 50% of time in face to face counsel of the patient in review of her radiographs personally, along with her microbiological and laboratory data and in coordination of her care with Dr Sloan Leiter..      LOS: 0 days   Alcide Evener 05/31/2021, 11:40 AM

## 2021-05-31 NOTE — Progress Notes (Signed)
Pt being discharged, VSS, Education provided,. IV removed, telebox returned. Pt transporting via uber to home.   Chrisandra Carota, RN 05/31/2021 11:55 AM

## 2021-05-31 NOTE — Progress Notes (Signed)
[facility] Progress Note  Subjective: pt going home today. Exit site cx + for [facility] so far.   Vitals:   05/30/21 2035 05/31/21 0136 05/31/21 0659 05/31/21 0742  BP: 118/79 (!) 152/77 (!) 158/95 (!) 146/105  Pulse: 86  80 (!) 103  Resp: 16 19  17   Temp: 98.9 F (37.2 C) 98.8 F (37.1 C) 98.8 F (37.1 C) 98.3 F (36.8 C)  TempSrc: Oral Oral Oral Oral  SpO2: 93% 94% 96% 93%  Weight:        Exam:   alert, nad   no jvd  Chest cta bilat  Cor reg no RG  Abd soft ntnd no ascites   Ext no LE edema, scattered light red skin lesions bilat lower legs   Alert, NF, ox3    R IJ TDC w/ mild purulent exudate, no erythema     [facility] HD: [facility] M[facility]   3.5h  400/500  2/2 bath  P2  TDC RIJ  90kg  Hep 6000  - no esa/ vdra  - home renal = auryxia 3 ac tid/ norvasc 5/ coreg 12.5 bid/ renavite qd/ fosrenol 1gm w/ each meal and snack   Assessment/ Plan: 1. Fever/ LE rash/ weakness: Scattered erythematous lesions w/ fevers and gen weakness/ ^WBC count. Blood cx's negative. Has mild exit site infection, culture+ staph aureus. Will rx empirically w/ 2 wks IV vanc at dialysis. Also will be getting empiric steroid taper for erythema nodosum and will be referred to Rhe[facility] as outpt (we will make the referral).  2.  ESRD: HD MWF. Stable.  3. HTN/ volume: BP soft, on norvasc and coreg. Will dc norvasc. Close to dry wt, no vol^ on exam.  4.  Anemia ckd: Hgb 11.6, no ESA indicated at this time.  5.  Metabolic bone disease: Corrected calcium elevated. Not on VDRA outpatient. Continue low calcium bath with HD. Resume phosphorus binders once tolerating PO intake.  6. Dispo - for dc today     Rob Sou Nohr 05/31/2021, 10:33 AM   Recent Labs  Lab 05/30/21 0119 05/31/21 0227  K 4.3 4.5  BUN 50* 31*  CREATININE 9.84* 5.98*  CALCIUM 9.6 9.7  PHOS 6.5* 2.7  HGB 10.1* 10.6*   Inpatient medications: . apixaban  2.5 mg Oral BID  . atorvastatin  10 mg Oral Daily  . carvedilol  12.5 mg Oral BID WC   . Chlorhexidine Gluconate Cloth  6 each Topical Q0600  . docusate sodium  100 mg Oral BID  . ferric citrate  420 mg Oral TID with meals  . [START ON 06/01/2021] heparin sodium (porcine)  1,000 Units Intravenous Q M,W,F-HD  . methylPREDNISolone (SOLU-MEDROL) injection  45 mg Intravenous Q12H  . multivitamin  1 tablet Oral QHS  . sodium chloride flush  3 mL Intravenous Q12H    acetaminophen **OR** acetaminophen, albuterol, bisacodyl, calcium carbonate (dosed in mg elemental calcium), camphor-menthol **AND** hydrOXYzine, docusate sodium, feeding supplement (NEPRO CARB STEADY), fentaNYL (SUBLIMAZE) injection, hydrALAZINE, ondansetron **OR** ondansetron (ZOFRAN) IV, polyethylene glycol, sorbitol, zolpidem

## 2021-05-31 NOTE — Discharge Summary (Signed)
PATIENT DETAILS Name: Whitney Walker Age: 66 y.o. Sex: female Date of Birth: 1956-05-06 MRN: [id number]. Admitting Physician: Karmen Bongo, MD [id number], Doreene Burke, FNP  Admit Date: 05/28/2021 Discharge date: 05/31/2021  Recommendations for Outpatient Follow-up:  1. Follow up with PCP in 1-2 weeks 2. Please obtain CMP/CBC in one week 3. Please ensure outpatient follow-up with rheumatology 4. Please ensure outpatient follow-up with nephrology 5. EBV PCR/CMV PCR, HCV PCR, parvovirus B19 antibody, cryoglobulin levels, SPEP, ANA-pending-please follow-up  Admitted From:  Home  Disposition: [place]: No  Equipment/Devices: None  Discharge Condition: Stable  CODE STATUS: FULL CODE  Diet recommendation:  Diet Order            Diet - low sodium heart healthy           Diet Heart Room service appropriate? Yes; Fluid consistency: Thin  Diet effective now                  Brief Narrative: Patient is a 65 y.o. female with history of ESRD on HD MWF, VTE on anticoagulation, HLD, HTN who presented with bilateral painful nodular lower extremity rash, hyperkalemia.  Post admission she started developing fever.   Significant events: 5/30>> admit for eval/work-up of weakness/painful nodular rash in lower extremities, diffuse arthralgias-found to have hypokalemia.   5/30>> fever up to 101 post admission. 6/1>> trial of empiric steroids started. 6/2>> feels better-adamant about leaving the hospital today.  Significant studies: 5/30>> CXR: No pneumonia. 5/31 >>rheumatoid factor: 20.8 (elevated)  Antimicrobial therapy: None  Microbiology data: 5/30>> COVID/influenza PCR: Negative 5/31>> blood culture:  No growth 6/1>> TDC catheter site culture: Staff aureus (sensitivities pending)  Procedures : None  Consults: ID, nephrology, vascular surgery  Brief Hospital Course: Hyperkalemia: Treated with hemodialysis-potassium has now normalized.     Fever/polyarthralgias/nodular lower extremity skin rash (?erythema nodosum): Suspicion for other autoimmune or infectious etiology.  Blood cultures negative so far.  CRP remains significantly elevated, RA factor is elevated, ANCA proteinase 3 also elevated.  Rest of autoimmune work-up was negative.  Consideration for a skin biopsy was given-however patient on 6/2 refused-is adamant about leaving the hospital.  Patient was started on IV steroids on 6/1-and arthralgias have improved-she still has some nodular painful rash in her lower extremities but feels much better.  She is requesting discharge-and is adamant about leaving the hospital today.  Discussed with nephrologist-Dr. Annamarie Dawley will arrange for outpatient rheumatology follow-up-patient will be on tapering steroids-hopefully till rheumatology has seen the patient, nephrology plans on starting patient on IV vancomycin x2 weeks with hemodialysis for mild exit site line infection (preliminary cultures positive for staph aureus)  ESRD on HD MWF: Nephrology following and directing care.  Normocytic anemia: Due to ESRD-defer to nephrology.   HTN: BP controlled-continue Norvasc/Coreg.   HLD: On statin  History of VTE: On Eliquis.  Obesity: Estimated body mass index is 37.78 kg/m as calculated from the following:   Height as of 05/17/21: 5\' 1"  (1.549 m).   Weight as of this encounter: 90.7 kg.    Discharge Diagnoses:  Principal Problem:   Hyperkalemia, diminished renal excretion Active Problems:   Essential hypertension   ESRD on dialysis (HCC)   Generalized weakness   Rash and nonspecific skin eruption   FUO (fever of unknown origin)   Polyarthritis   Polyarthralgia   Discharge Instructions:  Activity:  As tolerated   Discharge Instructions    Diet - low sodium heart healthy   Complete by: As  directed    Discharge instructions   Complete by: As directed    Follow with Primary MD  [name], FNP in 1-2  weeks  Follow with hemodialysis center as previous.  Your nephrologist is going to refer you to a rheumatologist-you should be getting a call from them soon.  If you do not hear from them-please reach out to your nephrologist office.  He will be on tapering doses of prednisone (steroids) until you are seen by a rheumatologist  Please get a complete blood count and chemistry panel checked by your Primary MD at your next visit, and again as instructed by your Primary MD.  Get Medicines reviewed and adjusted: Please take all your medications with you for your next visit with your Primary MD  Laboratory/radiological data: Please request your Primary MD to go over all hospital tests and procedure/radiological results at the follow up, please ask your Primary MD to get all Hospital records sent to his/her office.  In some cases, they will be blood work, cultures and biopsy results pending at the time of your discharge. Please request that your primary care M.D. follows up on these results.  Also Note the following: If you experience worsening of your admission symptoms, develop shortness of breath, life threatening emergency, suicidal or homicidal thoughts you must seek medical attention immediately by calling 911 or calling your MD immediately  if symptoms less severe.  You must read complete instructions/literature along with all the possible adverse reactions/side effects for all the Medicines you take and that have been prescribed to you. Take any new Medicines after you have completely understood and accpet all the possible adverse reactions/side effects.   Do not drive when taking Pain medications or sleeping medications (Benzodaizepines)  Do not take more than prescribed Pain, Sleep and Anxiety Medications. It is not advisable to combine anxiety,sleep and pain medications without talking with your primary care practitioner  Special Instructions: If you have smoked or chewed Tobacco  in the  last 2 yrs please stop smoking, stop any regular Alcohol  and or any Recreational drug use.  Wear Seat belts while driving.  Please note: You were cared for by a hospitalist during your hospital stay. Once you are discharged, your primary care physician will handle any further medical issues. Please note that NO REFILLS for any discharge medications will be authorized once you are discharged, as it is imperative that you return to your primary care physician (or establish a relationship with a primary care physician if you do not have one) for your post hospital discharge needs so that they can reassess your need for medications and monitor your lab values.   Increase activity slowly   Complete by: As directed    No dressing needed   Complete by: As directed      Allergies as of 05/31/2021      Reactions   Carnosine    Other reaction(s): Unknown   Gadolinium Derivatives Hives, Other (See Comments)   HIVES, Desc: HIVES W/ "DYE" USED FOR 1ST CT SCAN BUT NOT 2ND, NO PREMEDS USED, PT UNCERTAIN OF CIRCUMSTANCES,,?POSSIBLE MRI CONTRAST ALLERGY, ALL STUDIES DONE "SOMEWHERE" IN [place]//A.C., Onset Date: [id number]   Iohexol Other (See Comments)    Code: HIVES, Desc: HIVES W/ "DYE" USED FOR 1ST CT SCAN BUT NOT 2ND, NO PREMEDS USED, PT UNCERTAIN OF CIRCUMSTANCES,,?POSSIBLE MRI CONTRAST ALLERGY, ALL STUDIES DONE "SOMEWHERE" IN [place]//A.C., Onset Date: [id number]   Iodine Hives   Naltrexone    Other reaction(s): Unknown  Medication List    STOP taking these medications   ibuprofen 200 MG tablet Commonly known as: ADVIL     TAKE these medications   acetaminophen 500 MG tablet Commonly known as: TYLENOL Take 1,000 mg by mouth 2 (two) times daily as needed for moderate pain or headache.   albuterol 108 (90 Base) MCG/ACT inhaler Commonly known as: VENTOLIN HFA Inhale 2 puffs into the lungs every 6 (six) hours as needed for wheezing or shortness of breath.   amLODipine 5 MG  tablet Commonly known as: NORVASC Take 1 tablet (5 mg total) by mouth daily.   apixaban 2.5 MG Tabs tablet Commonly known as: Eliquis Take 1 tablet (2.5 mg total) by mouth 2 (two) times daily.   atorvastatin 10 MG tablet Commonly known as: Lipitor Take 1 tablet (10 mg total) by mouth daily.   Auryxia 1 GM 210 MG(Fe) tablet Generic drug: ferric citrate Take 420-840 mg by mouth with breakfast, with lunch, and with evening meal. Taking  2 tablets daily (420mg ) with each snack   Biotin 10000 MCG Tabs Take 10,000 mcg by mouth daily.   carvedilol 12.5 MG tablet Commonly known as: COREG Take 12.5 mg by mouth 2 (two) times daily with a meal.   lanthanum 1000 MG chewable tablet Commonly known as: FOSRENOL Chew 1,000 mg by mouth See admin instructions. Take 1000 mg with each meal and each snack   meclizine 12.5 MG tablet Commonly known as: ANTIVERT Take 1 tablet (12.5 mg total) by mouth 3 (three) times daily as needed for dizziness.   multivitamin Tabs tablet Take 1 tablet by mouth at bedtime.   mupirocin ointment 2 % Commonly known as: BACTROBAN Apply 1 application topically daily as needed (skin bumps).   nystatin powder Commonly known as: nystatin Apply 1 application topically 4 (four) times daily as needed. What changed: reasons to take this   omeprazole 20 MG capsule Commonly known as: PRILOSEC Take 1 capsule (20 mg total) by mouth daily.   predniSONE 10 MG tablet Commonly known as: DELTASONE Take 4 tablets (40 mg) daily for 6 days, then, Take 3 tablets (30 mg) daily for 6 days, then, Take 2 tablets (20 mg) daily for 6 days, then, Take 1 tablets (10 mg) daily for 2 days, then stop   vitamin C 100 MG tablet Take 100 mg by mouth daily.   vitamin E 200 UNIT capsule Take 200 Units by mouth daily.            Discharge Care Instructions  (From admission, onward)         Start     Ordered   05/31/21 0000  No dressing needed        05/31/21 1126           Follow-up Information    [name], FNP. Schedule an appointment as soon as possible for a visit in 1 week(s).   Specialty: General Practice Contact information: [address] [place] [address] [phone]        Hemodialysis center Follow up.   Why: Go to your usual dialysis center as per your previous schedule.             Allergies  Allergen Reactions  . Carnosine     Other reaction(s): Unknown  . Gadolinium Derivatives Hives and Other (See Comments)    HIVES, Desc: HIVES W/ "DYE" USED FOR 1ST CT SCAN BUT NOT 2ND, NO PREMEDS USED, PT UNCERTAIN OF CIRCUMSTANCES,,?POSSIBLE MRI CONTRAST ALLERGY,  ALL STUDIES DONE "SOMEWHERE" IN [place]//A.C., Onset Date: [id number]  . Iohexol Other (See Comments)     Code: HIVES, Desc: HIVES W/ "DYE" USED FOR 1ST CT SCAN BUT NOT 2ND, NO PREMEDS USED, PT UNCERTAIN OF CIRCUMSTANCES,,?POSSIBLE MRI CONTRAST ALLERGY, ALL STUDIES DONE "SOMEWHERE" IN [place]//A.C., Onset Date: [id number]   . Iodine Hives  . Naltrexone     Other reaction(s): Unknown      Other Procedures/Studies: CARDIAC CATHETERIZATION  Result Date: 05/01/2021 Left heart catheterization 05/01/2021: Moderate disease in the right coronary artery and circumflex coronary artery with diffuse coronary calcification, minimal disease in the LAD.  Normal LV systolic function and normal LVEDP.  No pressure gradient across aortic valve. Recommendation: Evaluation for noncardiac causes of chest pain is indicated.  30 - 40 mL contrast utilized.  Right femoral arterial access closed with minx.  DG Chest Port 1 View  Result Date: 05/28/2021 CLINICAL DATA:  Cough. Bilateral leg pain and rash beginning 3 days ago after dialysis. EXAM: PORTABLE CHEST 1 VIEW COMPARISON:  Chest CT, 02/27/2021. Chest radiographs, 02/05/2021 and older studies. FINDINGS: Cardiac silhouette normal in size.  No mediastinal or hilar masses. Opacity at the left lung base consistent with atelectasis  likely with a small effusion as noted on the prior chest CT. Remainder of the lungs is clear. No pneumothorax. Right internal jugular dual-lumen tunneled central venous catheter is stable. Skeletal structures are grossly intact. IMPRESSION: No acute cardiopulmonary disease. Electronically Signed   By: Lajean Manes M.D.   On: 05/28/2021 09:04   PCV CAROTID DUPLEX (BILATERAL)  Result Date: 05/09/2021 Carotid artery duplex 05/08/2021: No flow noted in the right ICA (Total occlusion). Stenosis in the left internal carotid artery (16-49%). Stenosis in the left external carotid artery (<50%). Antegrade right vertebral artery flow. Antegrade left vertebral artery flow. Compared to the study done on 11/16/2020, right ACA occlusion is new.  Previously there was no significant disease. Follow up in six months is appropriate if clinically indicated. (patient was brought back next day to confirm occlusion, patient asymptomatic, occlusion confirmed, will arrange OV).    TODAY-DAY OF DISCHARGE:  Subjective:   Whitney Walker today has no headache,no chest abdominal pain,no new weakness tingling or numbness, feels much better wants to go home today.   Objective:   Blood pressure (!) 146/105, pulse (!) 103, temperature 98.3 F (36.8 C), temperature source Oral, resp. rate 17, weight 90.7 kg, SpO2 93 %.  Intake/Output Summary (Last 24 hours) at 05/31/2021 1126 Last data filed at 05/31/2021 0830 Gross per 24 hour  Intake 360 ml  Output --  Net 360 ml   Filed Weights   05/29/21 0420 05/30/21 0728 05/30/21 1109  Weight: 91.6 kg 92.9 kg 90.7 kg    Exam: Awake Alert, Oriented *3, No new F.N deficits, Normal affect [place].AT,PERRAL Supple Neck,No JVD, No cervical lymphadenopathy appriciated.  Symmetrical Chest wall movement, Good air movement bilaterally, CTAB RRR,No Gallops,Rubs or new Murmurs, No Parasternal Heave +ve B.Sounds, Abd Soft, Non tender, No organomegaly appriciated, No rebound -guarding or  rigidity. No Cyanosis, Clubbing or edema, No new Rash or bruise   PERTINENT RADIOLOGIC STUDIES: No results found.   PERTINENT LAB RESULTS: CBC: Recent Labs    05/30/21 0119 05/31/21 0227  WBC 11.0* 9.9  HGB 10.1* 10.6*  HCT 32.0* 32.7*  PLT 203 186   CMET CMP     Component Value Date/Time   NA 132 (L) 05/31/2021 0227   NA 135 04/25/2021 1128   NA 138 07/07/2017 1235  K 4.5 05/31/2021 0227   K 4.5 07/07/2017 1235   CL 95 (L) 05/31/2021 0227   CL 111 (H) 12/04/2012 1056   CO2 24 05/31/2021 0227   CO2 17 (L) 07/07/2017 1235   GLUCOSE 249 (H) 05/31/2021 0227   GLUCOSE 85 07/07/2017 1235   GLUCOSE 97 12/04/2012 1056   BUN 31 (H) 05/31/2021 0227   BUN 13 04/25/2021 1128   BUN 40.8 (H) 07/07/2017 1235   CREATININE 5.98 (H) 05/31/2021 0227   CREATININE 3.4 (HH) 07/07/2017 1235   CALCIUM 9.7 05/31/2021 0227   CALCIUM 9.5 07/07/2017 1235   PROT 7.2 05/28/2021 0842   PROT 7.4 03/15/2021 1143   PROT 7.2 07/07/2017 1235   ALBUMIN 2.6 (L) 05/31/2021 0227   ALBUMIN 3.7 (L) 03/15/2021 1143   ALBUMIN 2.7 (L) 07/07/2017 1235   AST 20 05/28/2021 0842   AST 14 07/07/2017 1235   ALT 16 05/28/2021 0842   ALT 16 07/07/2017 1235   ALKPHOS 111 05/28/2021 0842   ALKPHOS 112 07/07/2017 1235   BILITOT 0.3 05/28/2021 0842   BILITOT 0.4 03/15/2021 1143   BILITOT <0.22 07/07/2017 1235   GFRNONAA 7 (L) 05/31/2021 0227   GFRAA 7 (L) 05/18/2020 1036    GFR Estimated Creatinine Clearance: 9.6 mL/min (A) (by C-G formula based on SCr of 5.98 mg/dL (H)). No results for input(s): LIPASE, AMYLASE in the last 72 hours. No results for input(s): CKTOTAL, CKMB, CKMBINDEX, TROPONINI in the last 72 hours. Invalid input(s): POCBNP No results for input(s): DDIMER in the last 72 hours. No results for input(s): HGBA1C in the last 72 hours. No results for input(s): CHOL, HDL, LDLCALC, TRIG, CHOLHDL, LDLDIRECT in the last 72 hours. No results for input(s): TSH, T4TOTAL, T3FREE, THYROIDAB in the  last 72 hours.  Invalid input(s): FREET3 No results for input(s): VITAMINB12, FOLATE, FERRITIN, TIBC, IRON, RETICCTPCT in the last 72 hours. Coags: No results for input(s): INR in the last 72 hours.  Invalid input(s): PT Microbiology: Recent Results (from the past 240 hour(s))  Resp Panel by RT-PCR (Flu A&B, Covid) Nasopharyngeal Swab     Status: None   Collection Time: 05/28/21  8:44 AM   Specimen: Nasopharyngeal Swab; Nasopharyngeal(NP) swabs in vial transport medium  Result Value Ref Range Status   SARS Coronavirus 2 by RT PCR NEGATIVE NEGATIVE Final    Comment: (NOTE) SARS-CoV-2 target nucleic acids are NOT DETECTED.  The SARS-CoV-2 RNA is generally detectable in upper respiratory specimens during the acute phase of infection. The lowest concentration of SARS-CoV-2 viral copies this assay can detect is 138 copies/mL. A negative result does not preclude SARS-Cov-2 infection and should not be used as the sole basis for treatment or other patient management decisions. A negative result may occur with  improper specimen collection/handling, submission of specimen other than nasopharyngeal swab, presence of viral mutation(s) within the areas targeted by this assay, and inadequate number of viral copies(<138 copies/mL). A negative result must be combined with clinical observations, patient history, and epidemiological information. The expected result is Negative.  Fact Sheet for Patients:  EntrepreneurPulse.com.au  Fact Sheet for Healthcare Providers:  IncredibleEmployment.be  This test is no t yet approved or cleared by the [place] FDA and  has been authorized for detection and/or diagnosis of SARS-CoV-2 by FDA under an Emergency Use Authorization (EUA). This EUA will remain  in effect (meaning this test can be used) for the duration of the COVID-19 declaration under Section 564(b)(1) of the Act, 21 U.S.C.section 360bbb-3(b)(1), unless  the authorization is terminated  or revoked sooner.       Influenza A by PCR NEGATIVE NEGATIVE Final   Influenza B by PCR NEGATIVE NEGATIVE Final    Comment: (NOTE) The Xpert Xpress SARS-CoV-2/FLU/RSV plus assay is intended as an aid in the diagnosis of influenza from Nasopharyngeal swab specimens and should not be used as a sole basis for treatment. Nasal washings and aspirates are unacceptable for Xpert Xpress SARS-CoV-2/FLU/RSV testing.  Fact Sheet for Patients: EntrepreneurPulse.com.au  Fact Sheet for Healthcare Providers: IncredibleEmployment.be  This test is not yet approved or cleared by the [place] FDA and has been authorized for detection and/or diagnosis of SARS-CoV-2 by FDA under an Emergency Use Authorization (EUA). This EUA will remain in effect (meaning this test can be used) for the duration of the COVID-19 declaration under Section 564(b)(1) of the Act, 21 U.S.C. section 360bbb-3(b)(1), unless the authorization is terminated or revoked.  Performed at [facility], [place] [address] [place], [place] [address]   Culture, blood (routine x 2)     Status: None (Preliminary result)   Collection Time: 05/29/21  8:28 AM   Specimen: BLOOD  Result Value Ref Range Status   Specimen Description BLOOD LEFT ANTECUBITAL  Final   Special Requests   Final    BOTTLES DRAWN AEROBIC AND ANAEROBIC Blood Culture adequate volume   Culture   Final    NO GROWTH 2 DAYS Performed at [facility], [place] [address], [place], [place] [address]    Report Status PENDING  Incomplete  Culture, blood (routine x 2)     Status: None (Preliminary result)   Collection Time: 05/29/21  9:30 AM   Specimen: BLOOD LEFT FOREARM  Result Value Ref Range Status   Specimen Description BLOOD LEFT FOREARM  Final   Special Requests   Final    BOTTLES DRAWN AEROBIC AND ANAEROBIC Blood Culture results may not be optimal due to an inadequate volume of blood  received in culture bottles   Culture   Final    NO GROWTH 2 DAYS Performed at [facility], [place] [address] [place], [place] [address]    Report Status PENDING  Incomplete  Aerobic Culture w Gram Stain (superficial specimen)     Status: None (Preliminary result)   Collection Time: 05/30/21 10:48 AM   Specimen: Cath Site; Wound  Result Value Ref Range Status   Specimen Description CATH SITE  Final   Special Requests NONE  Final   Gram Stain   Final    FEW WBC PRESENT,BOTH PMN AND MONONUCLEAR RARE GRAM POSITIVE COCCI    Culture   Final    FEW STAPHYLOCOCCUS AUREUS SUSCEPTIBILITIES TO FOLLOW Performed at [facility], [place] [address] [place], [name] [address]    Report Status PENDING  Incomplete    FURTHER DISCHARGE INSTRUCTIONS:  Get Medicines reviewed and adjusted: Please take all your medications with you for your next visit with your Primary MD  Laboratory/radiological data: Please request your Primary MD to go over all hospital tests and procedure/radiological results at the follow up, please ask your Primary MD to get all Hospital records sent to his/her office.  In some cases, they will be blood work, cultures and biopsy results pending at the time of your discharge. Please request that your primary care M.D. goes through all the records of your hospital data and follows up on these results.  Also Note the following: If you experience worsening of your  admission symptoms, develop shortness of breath, life threatening emergency, suicidal or homicidal thoughts you must seek medical attention immediately by calling 911 or calling your MD immediately  if symptoms less severe.  You must read complete instructions/literature along with all the possible adverse reactions/side effects for all the Medicines you take and that have been prescribed to you. Take any new Medicines after you have completely understood and accpet all the possible adverse reactions/side effects.    Do not drive when taking Pain medications or sleeping medications (Benzodaizepines)  Do not take more than prescribed Pain, Sleep and Anxiety Medications. It is not advisable to combine anxiety,sleep and pain medications without talking with your primary care practitioner  Special Instructions: If you have smoked or chewed Tobacco  in the last 2 yrs please stop smoking, stop any regular Alcohol  and or any Recreational drug use.  Wear Seat belts while driving.  Please note: You were cared for by a hospitalist during your hospital stay. Once you are discharged, your primary care physician will handle any further medical issues. Please note that NO REFILLS for any discharge medications will be authorized once you are discharged, as it is imperative that you return to your primary care physician (or establish a relationship with a primary care physician if you do not have one) for your post hospital discharge needs so that they can reassess your need for medications and monitor your lab values.  Total Time spent coordinating discharge including counseling, education and face to face time equals 35 minutes.  SignedOren Binet 05/31/2021 11:26 AM

## 2021-06-01 ENCOUNTER — Other Ambulatory Visit: Payer: Self-pay | Admitting: *Deleted

## 2021-06-01 ENCOUNTER — Telehealth: Payer: Self-pay | Admitting: Nephrology

## 2021-06-01 DIAGNOSIS — D689 Coagulation defect, unspecified: Secondary | ICD-10-CM | POA: Diagnosis not present

## 2021-06-01 DIAGNOSIS — A498 Other bacterial infections of unspecified site: Secondary | ICD-10-CM | POA: Diagnosis not present

## 2021-06-01 DIAGNOSIS — Z992 Dependence on renal dialysis: Secondary | ICD-10-CM | POA: Diagnosis not present

## 2021-06-01 DIAGNOSIS — N186 End stage renal disease: Secondary | ICD-10-CM | POA: Diagnosis not present

## 2021-06-01 DIAGNOSIS — N2581 Secondary hyperparathyroidism of renal origin: Secondary | ICD-10-CM | POA: Diagnosis not present

## 2021-06-01 LAB — AEROBIC CULTURE W GRAM STAIN (SUPERFICIAL SPECIMEN)

## 2021-06-01 LAB — ANTINUCLEAR ANTIBODIES, IFA: ANA Ab, IFA: NEGATIVE

## 2021-06-01 NOTE — Telephone Encounter (Signed)
Transition of care contact from inpatient facility  Date of discharge: 05/31/21 Date of contact: 06/01/21 Method: Phone Spoke to: Patient  Patient contacted to discuss transition of care from recent inpatient hospitalization. Patient was admitted to [facility] from 5/30-05/31/21 with discharge diagnosis of fever/polyarthralgias.   Medication changes were reviewed. Continues prednisone taper.   Patient will follow up with his/her outpatient HD unit on: Her next dialysis will be on Monday 6/6.

## 2021-06-02 LAB — EPSTEIN-BARR VIRUS (EBV) ANTIBODY PROFILE
EBV NA IgG: >600 U/mL — ABNORMAL HIGH (ref 0.0–17.9)
EBV VCA IgG: >600 U/mL — ABNORMAL HIGH (ref 0.0–17.9)
EBV VCA IgM: <36 U/mL (ref 0.0–35.9)

## 2021-06-02 LAB — CMV DNA, QUANTITATIVE, PCR
CMV DNA Quant: NEGATIVE IU/mL
Log10 CMV Qn DNA Pl: UNDETERMINED log10 IU/mL

## 2021-06-03 LAB — CULTURE, BLOOD (ROUTINE X 2)
Culture: NO GROWTH
Culture: NO GROWTH
Special Requests: ADEQUATE

## 2021-06-03 LAB — CRYOGLOBULIN

## 2021-06-04 ENCOUNTER — Telehealth: Payer: Self-pay

## 2021-06-04 ENCOUNTER — Telehealth: Payer: Medicare Other

## 2021-06-04 DIAGNOSIS — N186 End stage renal disease: Secondary | ICD-10-CM | POA: Diagnosis not present

## 2021-06-04 DIAGNOSIS — D631 Anemia in chronic kidney disease: Secondary | ICD-10-CM | POA: Diagnosis not present

## 2021-06-04 DIAGNOSIS — E875 Hyperkalemia: Secondary | ICD-10-CM | POA: Diagnosis not present

## 2021-06-04 DIAGNOSIS — D689 Coagulation defect, unspecified: Secondary | ICD-10-CM | POA: Diagnosis not present

## 2021-06-04 DIAGNOSIS — A498 Other bacterial infections of unspecified site: Secondary | ICD-10-CM | POA: Diagnosis not present

## 2021-06-04 DIAGNOSIS — N2581 Secondary hyperparathyroidism of renal origin: Secondary | ICD-10-CM | POA: Diagnosis not present

## 2021-06-04 DIAGNOSIS — Z992 Dependence on renal dialysis: Secondary | ICD-10-CM | POA: Diagnosis not present

## 2021-06-04 NOTE — Telephone Encounter (Signed)
Transition Care Management Unsuccessful Follow-up Telephone Call  Date of discharge and from where:  05/31/2021 [facility]  Attempts:  1st Attempt  Reason for unsuccessful TCM follow-up call:  Left voice message

## 2021-06-04 NOTE — Chronic Care Management (AMB) (Signed)
Chronic Care Management Pharmacy Assistant   Name: Whitney Walker  MRN: [id number] DOB: 06/09/1956   Reason for Encounter: Medication Review/ Chart Prep for Medication Coordination  Recent office visits:  None  Recent consult visits:  05-08-2021 Verneda Skill (Cardiology). STOP Protonix 40 MG.  05-17-2021 Truitt Merle, MD. STOP Hectorol 4 MCG injection. START Omeprazole 20 MG daily.   Hospital visits:  Medication Reconciliation was completed by comparing discharge summary, patient's EMR and Pharmacy list, and upon discussion with patient.  Admitted to the hospital on 05-01-2021 due to Acute deep vein thrombosis of femoral vein of left lower extremity. Discharge date was 05-01-2021. Discharged from [place]?Medications Started at [facility] Discharge:?? None  Medication Changes at Hospital Discharge: None  Medications Discontinued at Hospital Discharge: -Stopped Amoxicillin 500 MG. Stopped Ibuprofen 800 MG  Medications that remain the same after Hospital Discharge:??  -All other medications will remain the same.     Hospital visits:  Medication Reconciliation was completed by comparing discharge summary, patient's EMR and Pharmacy list, and upon discussion with patient.  Admitted to the hospital on 05-28-2021 due to Hyperkalemia . Discharge date was 05-31-2021. Discharged from [place]?Medications Started at [facility] Discharge:?? Prednisone 10 MG- 4 tablets for 6 days, 3 tablets for 6 days, 2 tablets for 6 days then 1 tablet for 2 days  Medication Changes at Hospital Discharge: None  Medications Discontinued at Hospital Discharge: None  Medications that remain the same after Hospital Discharge:??  -All other medications will remain the same.      Medications: Outpatient Encounter Medications as of 06/04/2021  Medication Sig Note  . acetaminophen (TYLENOL) 500 MG tablet Take 1,000 mg by mouth 2 (two) times  daily as needed for moderate pain or headache.   . albuterol (VENTOLIN HFA) 108 (90 Base) MCG/ACT inhaler Inhale 2 puffs into the lungs every 6 (six) hours as needed for wheezing or shortness of breath.   [name] amLODipine (NORVASC) 5 MG tablet Take 1 tablet (5 mg total) by mouth daily.   [name] apixaban (ELIQUIS) 2.5 MG TABS tablet Take 1 tablet (2.5 mg total) by mouth 2 (two) times daily.   . Ascorbic Acid (VITAMIN C) 100 MG tablet Take 100 mg by mouth daily.   [name] atorvastatin (LIPITOR) 10 MG tablet Take 1 tablet (10 mg total) by mouth daily.   Lorin Picket 1 GM 210 MG(Fe) tablet Take 420-840 mg by mouth with breakfast, with lunch, and with evening meal. Taking  2 tablets daily (420mg ) with each snack   . Biotin 10000 MCG TABS Take 10,000 mcg by mouth daily.    . carvedilol (COREG) 12.5 MG tablet Take 12.5 mg by mouth 2 (two) times daily with a meal.    . lanthanum (FOSRENOL) 1000 MG chewable tablet Chew 1,000 mg by mouth See admin instructions. Take 1000 mg with each meal and each snack 05/28/2021: Stopped last week 05-21-21  . meclizine (ANTIVERT) 12.5 MG tablet Take 1 tablet (12.5 mg total) by mouth 3 (three) times daily as needed for dizziness.   . multivitamin (RENA-VIT) TABS tablet Take 1 tablet by mouth at bedtime.   . mupirocin ointment (BACTROBAN) 2 % Apply 1 application topically daily as needed (skin bumps).   . nystatin (NYSTATIN) powder Apply 1 application topically 4 (four) times daily as needed. (Patient taking differently: Apply 1 application topically 4 (four) times daily as needed (irriation).)   . omeprazole (PRILOSEC) 20  MG capsule Take 1 capsule (20 mg total) by mouth daily.   . predniSONE (DELTASONE) 10 MG tablet Take 4 tablets (40 mg) daily for 6 days, then, Take 3 tablets (30 mg) daily for 6 days, then, Take 2 tablets (20 mg) daily for 6 days, then, Take 1 tablets (10 mg) daily for 2 days, then stop   . vitamin E 200 UNIT capsule Take 200 Units by mouth daily.    No facility-administered  encounter medications on file as of 06/04/2021.   Reviewed chart for medication changes ahead of medication coordination call.   No medication changes indicated OR if recent visit, treatment plan here.  BP Readings from Last 3 Encounters:  05/31/21 (!) 166/101  05/17/21 114/78  05/08/21 121/79    Lab Results  Component Value Date   HGBA1C 5.1 06/04/2018    NOTES: Whitney Walker PTM states she will finish patient's Med Sync  Star Rating Drugs: Atorvastatin 20 MG- Last filled 01-17-2021 [facility]   [place] Clinical Pharmacist Assistant [phone]

## 2021-06-04 NOTE — Telephone Encounter (Signed)
  Care Management   Follow Up Note   06/04/2021 Name: Whitney Walker MRN: [id number] DOB: 05/09/1956   Referred by: [name], FNP Reason for referral : Chronic Care Management   SW placed a successful outbound call to the patient to assess for care coordination needs post discharge. After introducing self to the patient, patient stated "I asked you not to call me on Dialysis days, I am feeling very bad". SW apologized to the patient and stated SW would call on a different day. Patient hung up prior to scheduling follow up call.  Follow Up Plan: SW will follow up with the patient over the next week.  [name], BSW, CDP Social Worker, Certified Dementia Practitioner [place] [facility] [place] [facility] [phone]

## 2021-06-05 ENCOUNTER — Telehealth: Payer: Self-pay

## 2021-06-05 ENCOUNTER — Other Ambulatory Visit: Payer: Self-pay

## 2021-06-05 DIAGNOSIS — I700 Atherosclerosis of aorta: Secondary | ICD-10-CM

## 2021-06-05 DIAGNOSIS — I6523 Occlusion and stenosis of bilateral carotid arteries: Secondary | ICD-10-CM

## 2021-06-05 MED ORDER — ATORVASTATIN CALCIUM 10 MG PO TABS: 10.0000 mg | ORAL_TABLET | Freq: Every day | ORAL | 3 refills | Status: DC

## 2021-06-05 NOTE — Telephone Encounter (Signed)
Located in visit info.

## 2021-06-05 NOTE — Chronic Care Management (AMB) (Addendum)
Chronic Care Management Pharmacy Assistant   Name: Whitney Walker  MRN: [id number] DOB: November 25, 1956  Reason for Encounter: Medication Review/Medication Coordination Call   1st attempt call and chart review performed by Whitney Walker, CMA on 06/04/2021.  06/05/2021- Medication Coordination call completed.    BP Readings from Last 3 Encounters:  05/31/21 (!) 166/101  05/17/21 114/78  05/08/21 121/79    Lab Results  Component Value Date   HGBA1C 5.1 06/04/2018     Patient obtains medications through [facility]  30 Days   Last adherence delivery included: None  Patient declined the following medications last month: Albuterol inhaler 108 mcg- inhale 2 puffs every 6 hours due to PRN use.    Carvedilol 12.5 mg- 1 tablet daily on Mon, Wed, and Fridays and 1 tablet twice daily on Tues, Thurs, Sat and Sundays due to receiving a 30 day supply on 03/22/2021.   Eliquis 2.5 mg - 1 tablet by mouth twice a day                         Due to receiving a 90 day supply on 03/22/2021.   Amlodipine 5 mg- 1 tablet daily due to being discontinued by Nephrology.  Lunesta 2 mg- 1 tablet nightly due to not helping with Insomnia patient discontinued Nystatin 100 gm- apply four times a day as needed. Meclizine 12.5 mg- 1 tablet three times a day for dizziness due to PRN use.  Ondansetron 4 mg-  Dissolve 1 tablet under tongue every 6 hours due to PRN use. Azithromycin 250 mg- due to completed course. Amoxicillin 500 mg- due to completed course. Atorvastatin 20 mg- due to being changed to 10 mg- patient will use supply on hand. Ibuprofen 800 mg- 1 tablet by mouth three times daily due to completing course per Whitney Walker (Cardiology) Pantoprazole 40 mg- 1 tablet by mouth daily due to completing course per Whitney Walker (Cardiology)  Patient is due for next adherence delivery on: 06/11/2021. 06/05/2021- Called patient and reviewed medications and coordinated delivery.  This delivery to  include: Amlodipine 5 mg- 1 tablets daily Atorvastatin 10 mg- 1 tablet daily   Coordinated future acute fill forms for Carvedilol 12.5 mg to be delivered on 06/15/2021 and Eliquis 2.5 mg to be delivered on 06/20/2021.  Patient declined the following medications: Albuterol inhaler 108 mcg- inhale 2 puffs every 6 hours due to PRN use. Lunesta 2 mg- 1 tablet nightly due to not helping with Insomnia patient discontinued Nystatin 100 gm- apply four times a day as needed. Meclizine 12.5 mg- 1 tablet three times a day for dizziness due to PRN use.  Ondansetron 4 mg-  Dissolve 1 tablet under tongue every 6 hours due to PRN use. Azithromycin 250 mg- due to completed course. Amoxicillin 500 mg- due to completed course. Atorvastatin 20 mg- due to no longer taking this strength, patient taking 10 mg- requested new prescription. Ibuprofen 800 mg- 1 tablet by mouth three times daily due to completing course per Whitney Walker (Cardiology) Pantoprazole 40 mg- 1 tablet by mouth daily due to completing course per Whitney Walker (Cardiology)   Patient needs refills for Atorvastatin 10 mg, Amlodipine and Eliquis . Cardiology contacted to request Atorvastatin 10 mg dose and PCP contacted for refill of Amlodipine.   Confirmed delivery date of 06/11/2021, advised patient that pharmacy will contact them the morning of delivery.  6/7/2022[facility], spoke with CMA Whitney Walker, she will send over Atorvastatin 10  mg- 1 tablet daily to [facility].   During medication review patient mentioned that on 05/28/2021 she was having joint pain, swollen feet and a painful rash to bilateral legs that she was not sure what happened. Patient went to ED that day and was admitted for 3 days. Patient states the spots on her legs are much better and going away, she can walk now with no pain, she gets a little off balance but has her walker near in case. She mentioned her son from [place] was in on the weekend and  he and her grandkids too really good care of her. Patient would like to have a hospital follow up visit with Whitney Brine, Whitney Walker (PCP) but does not want to come into the office, she is aware of the need to have a CMP rechecked and wondering if that order can go to [place] where it would be easier for her to get transportation for. Patient aware I will request virtual office visit and lab order. Patient also mentioned she was referred to Rheumatology by hospitalitis. Patient very thankful for the help. Message sent to PCP office for visit and orders, Whitney Walker, Whitney Walker notified.  Star Rating Drugs: Atorvastatin 20 mg- Last filled 06/07/2021 for 30 day supply at [facility].   SIG: Whitney Walker, [place] Pharmacist Assistant [phone]

## 2021-06-06 DIAGNOSIS — E875 Hyperkalemia: Secondary | ICD-10-CM | POA: Diagnosis not present

## 2021-06-06 DIAGNOSIS — D631 Anemia in chronic kidney disease: Secondary | ICD-10-CM | POA: Diagnosis not present

## 2021-06-06 DIAGNOSIS — D689 Coagulation defect, unspecified: Secondary | ICD-10-CM | POA: Diagnosis not present

## 2021-06-06 DIAGNOSIS — N186 End stage renal disease: Secondary | ICD-10-CM | POA: Diagnosis not present

## 2021-06-06 DIAGNOSIS — N2581 Secondary hyperparathyroidism of renal origin: Secondary | ICD-10-CM | POA: Diagnosis not present

## 2021-06-06 DIAGNOSIS — Z992 Dependence on renal dialysis: Secondary | ICD-10-CM | POA: Diagnosis not present

## 2021-06-06 DIAGNOSIS — A498 Other bacterial infections of unspecified site: Secondary | ICD-10-CM | POA: Diagnosis not present

## 2021-06-07 ENCOUNTER — Other Ambulatory Visit: Payer: Self-pay

## 2021-06-07 ENCOUNTER — Telehealth: Payer: Self-pay

## 2021-06-07 ENCOUNTER — Telehealth: Payer: Medicare Other

## 2021-06-07 DIAGNOSIS — I10 Essential (primary) hypertension: Secondary | ICD-10-CM

## 2021-06-07 MED ORDER — AMLODIPINE BESYLATE 5 MG PO TABS
5.0000 mg | ORAL_TABLET | Freq: Every day | ORAL | 1 refills | Status: DC
Start: 2021-06-07 — End: 2021-07-22

## 2021-06-07 NOTE — Telephone Encounter (Signed)
Transition Care Management Follow-up Telephone Call Date of discharge and from where: [facility] How have you been since you were released from the hospital? She has been doing ok she still has some weakness. Any questions or concerns? No  Items Reviewed: Did the pt receive and understand the discharge instructions provided? Yes  Medications obtained and verified? Yes  Other? No  Any new allergies since your discharge? No  Dietary orders reviewed? No Do you have support at home? No   Home Care and Equipment/Supplies: Were home health services ordered? not applicable If so, what is the name of the agency?n/a Has the agency set up a time to come to the patient's home? no Were any new equipment or medical supplies ordered?  No What is the name of the medical supply agency? N/a Were you able to get the supplies/equipment? not applicable Do you have any questions related to the use of the equipment or supplies? No  Functional Questionnaire: (I = Independent and D = Dependent) ADLs: I  Bathing/Dressing- I  Meal Prep- I  Eating- I  Maintaining continence- I  Transferring/Ambulation- I  Managing Meds- I  Follow up appointments reviewed:  PCP Hospital f/u appt confirmed? Yes  Scheduled to see [name] DNP,FNP-BC on 06/12/21 @ 4:30pm.. [place] [facility] f/u appt confirmed? Yes  but doesn't have any appts set up yet Are transportation arrangements needed? Yes  If their condition worsens, is the pt aware to call PCP or go to the Emergency Dept.? Yes Was the patient provided with contact information for the PCP's office or ED? Yes Was to pt encouraged to call back with questions or concerns? Yes

## 2021-06-07 NOTE — Telephone Encounter (Signed)
  Care Management   Follow Up Note   06/07/2021 Name: Whitney Walker MRN: [id number] DOB: 27-Jan-1956   Referred by: [name], FNP Reason for referral : Chronic Care Management (Unsuccessful call)   A second unsuccessful telephone outreach was attempted today. The patient was referred to the case management team for assistance with care management and care coordination. SW left a HIPAA compliant voice message requesting a return call.  Follow Up Plan:  SW reviewed chart to note scheduled call with PharmD 6.16.22. Collaboration with PharmD to request a referral to SW if needed following scheduled call.  [name], BSW, CDP Social Worker, Certified Dementia Practitioner [place] [facility] [place] [facility] [phone]

## 2021-06-08 DIAGNOSIS — A498 Other bacterial infections of unspecified site: Secondary | ICD-10-CM | POA: Diagnosis not present

## 2021-06-08 DIAGNOSIS — D631 Anemia in chronic kidney disease: Secondary | ICD-10-CM | POA: Diagnosis not present

## 2021-06-08 DIAGNOSIS — D689 Coagulation defect, unspecified: Secondary | ICD-10-CM | POA: Diagnosis not present

## 2021-06-08 DIAGNOSIS — Z992 Dependence on renal dialysis: Secondary | ICD-10-CM | POA: Diagnosis not present

## 2021-06-08 DIAGNOSIS — N2581 Secondary hyperparathyroidism of renal origin: Secondary | ICD-10-CM | POA: Diagnosis not present

## 2021-06-08 DIAGNOSIS — N186 End stage renal disease: Secondary | ICD-10-CM | POA: Diagnosis not present

## 2021-06-08 DIAGNOSIS — E875 Hyperkalemia: Secondary | ICD-10-CM | POA: Diagnosis not present

## 2021-06-10 NOTE — Progress Notes (Deleted)
Vascular and Vein Specialist of [place]  Patient name: Whitney Walker MRN: [id number] DOB: 1956-04-13 Sex: female  REASON FOR VISIT: Discuss options for hemodialysis  HPI: Whitney Walker is a 65 y.o. female here today for continued discussion of hemodialysis options.  I had seen her in June 2020.  She has a extensive past history of left arm access attempts with Dr.Cain.  Initially she had a left brachiocephalic fistula which infiltrated and then underwent basilic vein transposition.  This also infiltrated and she is now using a catheter.  She is having a more difficult time using her catheter with positional difficulties and replacement at [facility] and still having positional issues.  Fortunately she has had no infectious complications.  On my last visit with her I had suggested right arm fistula creation.  Also explained that she would be a candidate for left arm AV graft.  She is very emotional regarding this and is not willing to proceed at that time and is still not willing to make a final decision.  She has concerns regarding the technical ability of the technicians at her current facility.  Past Medical History:  Diagnosis Date   Anemia    Anxiety    CHF (congestive heart failure) (HCC)    Colon cancer (HCC)    treatment surgery   Complication of anesthesia    after first C- [place] "couldnt walk after", patient denies having a spinal   COPD (chronic obstructive pulmonary disease) ([place])    Coronary artery disease    Depression    DVT (deep venous thrombosis) (HCC)    ESRD (end stage renal disease) ([place])    Hemo: MWF   History of blood transfusion 04/2018   Hypertension    07/07/18- no longer takes BP medications   Meningitis    Pain in limb 07/30/2013   PE (pulmonary embolism)    Peripheral vascular disease (HCC)    Restless legs    Shortness of breath    with exertion   Sleep apnea    SOB (shortness of breath) 03/03/2019    Vertigo     Family History  Problem Relation Age of Onset   Cancer Mother 24       breast and bone   Cancer Father 43       prostate   Hypertension Sister    Bleeding Disorder Sister    Cancer Cousin 79       breast cancer    Hypertension Daughter    Breast cancer Neg Hx     SOCIAL HISTORY: Social History   Tobacco Use   Smoking status: Every Day    Packs/day: 0.50    Years: 50.00    Pack years: 25.00    Types: Cigarettes    Last attempt to quit: 02/03/2021    Years since quitting: 0.3   Smokeless tobacco: Former  Substance Use Topics   Alcohol use: No    Alcohol/week: 0.0 standard drinks    Comment: she used to drink alcohol, quit in 2010; h/o heavy use     Allergies  Allergen Reactions   Carnosine     Other reaction(s): Unknown   Gadolinium Derivatives Hives and Other (See Comments)    HIVES, Desc: HIVES W/ "DYE" USED FOR 1ST CT SCAN BUT NOT 2ND, NO PREMEDS USED, PT UNCERTAIN OF CIRCUMSTANCES,,?POSSIBLE MRI CONTRAST ALLERGY, ALL STUDIES DONE "SOMEWHERE" IN [place]//A.C., Onset Date: [id number]   Iohexol Other (See Comments)     Code: HIVES,  Desc: HIVES W/ "DYE" USED FOR 1ST CT SCAN BUT NOT 2ND, NO PREMEDS USED, PT UNCERTAIN OF CIRCUMSTANCES,,?POSSIBLE MRI CONTRAST ALLERGY, ALL STUDIES DONE "SOMEWHERE" IN [place]//A.C., Onset Date: [id number]    Iodine Hives   Naltrexone     Other reaction(s): Unknown    Current Outpatient Medications  Medication Sig Dispense Refill   acetaminophen (TYLENOL) 500 MG tablet Take 1,000 mg by mouth 2 (two) times daily as needed for moderate pain or headache.     albuterol (VENTOLIN HFA) 108 (90 Base) MCG/ACT inhaler Inhale 2 puffs into the lungs every 6 (six) hours as needed for wheezing or shortness of breath. 18 g 0   amLODipine (NORVASC) 5 MG tablet Take 1 tablet (5 mg total) by mouth daily. 90 tablet 1   apixaban (ELIQUIS) 2.5 MG TABS tablet Take 1 tablet (2.5 mg total) by mouth 2 (two) times daily. 180 tablet 3   Ascorbic  Acid (VITAMIN C) 100 MG tablet Take 100 mg by mouth daily.     atorvastatin (LIPITOR) 10 MG tablet Take 1 tablet (10 mg total) by mouth daily. 90 tablet 3   AURYXIA 1 GM 210 MG(Fe) tablet Take 420-840 mg by mouth with breakfast, with lunch, and with evening meal. Taking  2 tablets daily (420mg ) with each snack     Biotin 10000 MCG TABS Take 10,000 mcg by mouth daily.      carvedilol (COREG) 12.5 MG tablet Take 12.5 mg by mouth 2 (two) times daily with a meal.      lanthanum (FOSRENOL) 1000 MG chewable tablet Chew 1,000 mg by mouth See admin instructions. Take 1000 mg with each meal and each snack     meclizine (ANTIVERT) 12.5 MG tablet Take 1 tablet (12.5 mg total) by mouth 3 (three) times daily as needed for dizziness. 30 tablet 0   multivitamin (RENA-VIT) TABS tablet Take 1 tablet by mouth at bedtime.     mupirocin ointment (BACTROBAN) 2 % Apply 1 application topically daily as needed (skin bumps). 22 g 2   nystatin (NYSTATIN) powder Apply 1 application topically 4 (four) times daily as needed. (Patient taking differently: Apply 1 application topically 4 (four) times daily as needed (irriation).) 30 g 1   omeprazole (PRILOSEC) 20 MG capsule Take 1 capsule (20 mg total) by mouth daily. 30 capsule 1   predniSONE (DELTASONE) 10 MG tablet Take 4 tablets (40 mg) daily for 6 days, then, Take 3 tablets (30 mg) daily for 6 days, then, Take 2 tablets (20 mg) daily for 6 days, then, Take 1 tablets (10 mg) daily for 2 days, then stop 56 tablet 0   vitamin E 200 UNIT capsule Take 200 Units by mouth daily.     No current facility-administered medications for this visit.    REVIEW OF SYSTEMS:  [X]  denotes positive finding, [ ]  denotes negative finding Cardiac  Comments:  Chest pain or chest pressure:    Shortness of breath upon exertion: x   Short of breath when lying flat:    Irregular heart rhythm:        Vascular    Pain in calf, thigh, or hip brought on by ambulation:    Pain in feet at night that  wakes you up from your sleep:     Blood clot in your veins:    Leg swelling:           PHYSICAL EXAM: There were no vitals filed for this visit.   GENERAL: The patient is a  well-nourished female, in no acute distress. The vital signs are documented above. CARDIOVASCULAR: 2+ radial pulses bilaterally.  She does not have large surface veins.   PULMONARY: There is good air exchange  MUSCULOSKELETAL: There are no major deformities or cyanosis. NEUROLOGIC: No focal weakness or paresthesias are detected. SKIN: There are no ulcers or rashes noted. PSYCHIATRIC: The patient has a normal affect.  DATA:  Prior duplex had shown acceptable size of right arm cephalic vein for attempted brachiocephalic fistula.  I imaged her veins with SonoSite ultrasound this does show good caliber cephalic and basilic vein in the right upper arm  MEDICAL ISSUES: I had another very long discussion with the patient regarding options.  She remains very emotional and is still not ready to commit.  She reports that the catheter is working most days but occasionally clots the machine.  I again explained options of left upper arm AV graft versus right arm fistula attempts.  Also explained that if her catheter is not working she would need to have this replaced as well.  She wishes to consider this further and also discussed this with Dr. [name].  Will notify us if she is willing to proceed    Rosetta Posner, MD [facility] Vascular and Vein Specialists of [facility] Tel [phone] Pager [phone]

## 2021-06-11 DIAGNOSIS — R52 Pain, unspecified: Secondary | ICD-10-CM | POA: Diagnosis not present

## 2021-06-11 DIAGNOSIS — A498 Other bacterial infections of unspecified site: Secondary | ICD-10-CM | POA: Diagnosis not present

## 2021-06-11 DIAGNOSIS — D689 Coagulation defect, unspecified: Secondary | ICD-10-CM | POA: Diagnosis not present

## 2021-06-11 DIAGNOSIS — N2581 Secondary hyperparathyroidism of renal origin: Secondary | ICD-10-CM | POA: Diagnosis not present

## 2021-06-11 DIAGNOSIS — Z992 Dependence on renal dialysis: Secondary | ICD-10-CM | POA: Diagnosis not present

## 2021-06-11 DIAGNOSIS — N186 End stage renal disease: Secondary | ICD-10-CM | POA: Diagnosis not present

## 2021-06-12 ENCOUNTER — Ambulatory Visit: Payer: Medicare Other | Admitting: Vascular Surgery

## 2021-06-12 ENCOUNTER — Ambulatory Visit (HOSPITAL_COMMUNITY): Payer: Medicare Other

## 2021-06-12 ENCOUNTER — Telehealth: Payer: Self-pay | Admitting: Nurse Practitioner

## 2021-06-12 ENCOUNTER — Ambulatory Visit (INDEPENDENT_AMBULATORY_CARE_PROVIDER_SITE_OTHER): Payer: Medicare Other

## 2021-06-12 ENCOUNTER — Telehealth: Payer: Medicare Other

## 2021-06-12 DIAGNOSIS — Z992 Dependence on renal dialysis: Secondary | ICD-10-CM

## 2021-06-12 DIAGNOSIS — I5032 Chronic diastolic (congestive) heart failure: Secondary | ICD-10-CM

## 2021-06-12 DIAGNOSIS — N186 End stage renal disease: Secondary | ICD-10-CM | POA: Diagnosis not present

## 2021-06-12 DIAGNOSIS — I10 Essential (primary) hypertension: Secondary | ICD-10-CM | POA: Diagnosis not present

## 2021-06-12 DIAGNOSIS — E559 Vitamin D deficiency, unspecified: Secondary | ICD-10-CM

## 2021-06-12 DIAGNOSIS — E875 Hyperkalemia: Secondary | ICD-10-CM

## 2021-06-12 DIAGNOSIS — I700 Atherosclerosis of aorta: Secondary | ICD-10-CM

## 2021-06-13 ENCOUNTER — Other Ambulatory Visit: Payer: Self-pay

## 2021-06-13 DIAGNOSIS — D689 Coagulation defect, unspecified: Secondary | ICD-10-CM | POA: Diagnosis not present

## 2021-06-13 DIAGNOSIS — R52 Pain, unspecified: Secondary | ICD-10-CM | POA: Diagnosis not present

## 2021-06-13 DIAGNOSIS — A498 Other bacterial infections of unspecified site: Secondary | ICD-10-CM | POA: Diagnosis not present

## 2021-06-13 DIAGNOSIS — N186 End stage renal disease: Secondary | ICD-10-CM | POA: Diagnosis not present

## 2021-06-13 DIAGNOSIS — I82412 Acute embolism and thrombosis of left femoral vein: Secondary | ICD-10-CM

## 2021-06-13 DIAGNOSIS — N2581 Secondary hyperparathyroidism of renal origin: Secondary | ICD-10-CM | POA: Diagnosis not present

## 2021-06-13 DIAGNOSIS — Z992 Dependence on renal dialysis: Secondary | ICD-10-CM | POA: Diagnosis not present

## 2021-06-13 MED ORDER — APIXABAN 2.5 MG PO TABS: 2.5000 mg | ORAL_TABLET | Freq: Two times a day (BID) | ORAL | 3 refills | Status: DC

## 2021-06-13 NOTE — Chronic Care Management (AMB) (Addendum)
Chronic Care Management   CCM RN Visit Note  06/12/2021 Name: Whitney Walker MRN: [id number] DOB: [birth year]  Subjective: Whitney Walker is a 65 y.o. year old female who is a primary care patient of [name], [place]. The care management team was consulted for assistance with disease management and care coordination needs.    Engaged with patient by telephone for follow up visit in response to provider referral for case management and/or care coordination services.   Consent to Services:  The patient was given information about Chronic Care Management services, agreed to services, and gave verbal consent prior to initiation of services.  Please see initial visit note for detailed documentation.   Patient agreed to services and verbal consent obtained.   Assessment: Review of patient past medical history, allergies, medications, health status, including review of consultants reports, laboratory and other test data, was performed as part of comprehensive evaluation and provision of chronic care management services.   SDOH (Social Determinants of Health) assessments and interventions performed:  Yes, no acute challenges   CCM Care Plan  Allergies  Allergen Reactions   Carnosine     Other reaction(s): Unknown   Gadolinium Derivatives Hives and Other (See Comments)    HIVES, Desc: HIVES W/ "DYE" USED FOR 1ST CT SCAN BUT NOT 2ND, NO PREMEDS USED, PT UNCERTAIN OF CIRCUMSTANCES,,?POSSIBLE MRI CONTRAST ALLERGY, ALL STUDIES DONE "SOMEWHERE" IN [place]//A.C., Onset Date: [id number]   Iohexol Other (See Comments)     Code: HIVES, Desc: HIVES W/ "DYE" USED FOR 1ST CT SCAN BUT NOT 2ND, NO PREMEDS USED, PT UNCERTAIN OF CIRCUMSTANCES,,?POSSIBLE MRI CONTRAST ALLERGY, ALL STUDIES DONE "SOMEWHERE" IN [place]//A.C., Onset Date: [id number]    Iodine Hives   Naltrexone     Other reaction(s): Unknown    Outpatient Encounter Medications as of 06/12/2021  Medication Sig Note    acetaminophen (TYLENOL) 500 MG tablet Take 1,000 mg by mouth 2 (two) times daily as needed for moderate pain or headache.    albuterol (VENTOLIN HFA) 108 (90 Base) MCG/ACT inhaler Inhale 2 puffs into the lungs every 6 (six) hours as needed for wheezing or shortness of breath.    amLODipine (NORVASC) 5 MG tablet Take 1 tablet (5 mg total) by mouth daily.    apixaban (ELIQUIS) 2.5 MG TABS tablet Take 1 tablet (2.5 mg total) by mouth 2 (two) times daily.    Ascorbic Acid (VITAMIN C) 100 MG tablet Take 100 mg by mouth daily.    atorvastatin (LIPITOR) 10 MG tablet Take 1 tablet (10 mg total) by mouth daily.    AURYXIA 1 GM 210 MG(Fe) tablet Take 420-840 mg by mouth with breakfast, with lunch, and with evening meal. Taking  2 tablets daily (420mg ) with each snack    Biotin 10000 MCG TABS Take 10,000 mcg by mouth daily.     carvedilol (COREG) 12.5 MG tablet Take 12.5 mg by mouth 2 (two) times daily with a meal.     lanthanum (FOSRENOL) 1000 MG chewable tablet Chew 1,000 mg by mouth See admin instructions. Take 1000 mg with each meal and each snack 05/28/2021: Stopped last week 05-21-21   meclizine (ANTIVERT) 12.5 MG tablet Take 1 tablet (12.5 mg total) by mouth 3 (three) times daily as needed for dizziness.    multivitamin (RENA-VIT) TABS tablet Take 1 tablet by mouth at bedtime.    mupirocin ointment (BACTROBAN) 2 % Apply 1 application topically daily as needed (skin bumps).    nystatin (NYSTATIN) powder Apply 1 application topically  4 (four) times daily as needed. (Patient taking differently: Apply 1 application topically 4 (four) times daily as needed (irriation).)    omeprazole (PRILOSEC) 20 MG capsule Take 1 capsule (20 mg total) by mouth daily.    predniSONE (DELTASONE) 10 MG tablet Take 4 tablets (40 mg) daily for 6 days, then, Take 3 tablets (30 mg) daily for 6 days, then, Take 2 tablets (20 mg) daily for 6 days, then, Take 1 tablets (10 mg) daily for 2 days, then stop    vitamin E 200 UNIT capsule  Take 200 Units by mouth daily.    No facility-administered encounter medications on file as of 06/12/2021.    Patient Active Problem List   Diagnosis Date Noted   FUO (fever of unknown origin)    Polyarthritis    Polyarthralgia    Hyperkalemia, diminished renal excretion 05/28/2021   Generalized weakness 05/28/2021   Rash and nonspecific skin eruption 05/28/2021   Atypical chest pain    Coronary artery calcification seen on CAT scan    Hyperkalemia 05/09/2020   Left leg pain 05/09/2020   Restless leg syndrome 09/19/2018   OSA (obstructive sleep apnea) 04/29/2018   ESRD on dialysis ([place]) 04/29/2018   Dyspnea on exertion 03/12/2017   Morbid (severe) obesity due to excess calories ([place]) 03/12/2017   Angina pectoris ([place]) 03/02/2017   Anemia of chronic kidney failure, stage 3 (moderate) ([place]) 12/10/2016   History of colon cancer 08/12/2016   Mass of left breast on mammogram 12/10/2015   Depression 10/18/2015   Numbness and tingling of left arm and leg 01/24/2015   PVD (peripheral vascular disease) with claudication ([place]) 03/23/2014   PAD (peripheral artery disease) ([place]) 12/14/2013   Chronic diastolic heart failure ([place]) 11/15/2013   Essential hypertension 11/15/2013   Leucocytosis 04/28/2012    Conditions to be addressed/monitored: ESRD, HTN, CHF, Vitamin D deficiency, Hyperkalemia, Aortic Atherosclerosis , HLD, Vitamin D deficiency    Care Plan : Auto-Immune  Updates made by [name], RN since 06/12/2021 12:00 AM     Problem: Auto-Immune - Evaluate and treat   Priority: High     Long-Range Goal: Auto-Immune - treatment optimized   Start Date: 06/12/2021  Expected End Date: 06/12/2022  This Visit's Progress: On track  Priority: High  Note:   Current Barriers:  Ineffective Self Health Maintenance  Clinical Goal(s):  Collaboration with [name], FNP regarding development and update of comprehensive plan of care as evidenced by provider attestation and  co-signature Inter-disciplinary care team collaboration (see longitudinal plan of care) patient will work with care management team to address care coordination and chronic disease management needs related to Disease Management Educational Needs Care Coordination Medication Management and Education Psychosocial Support   Interventions:  06/12/21 completed successful inbound call with patient  Evaluation of current treatment plan related to  ESRD, HTN, CHF, Vitamin D deficiency, Hyperkalemia, Aortic Atherosclerosis , HLD  , self-management and patient's adherence to plan as established by provider. Collaboration with [name], FNP regarding development and update of comprehensive plan of care as evidenced by provider attestation       and co-signature Inter-disciplinary care team collaboration (see longitudinal plan of care) Determined patient is calling to ask for clarification on her recent discharge from [facility] following an event that took place on 05/28/21-05/31/21 when she developed acute leg swelling and unexplained rash after completing her hemodialysis treatment  Reviewed and discussed the following Admission/Discharge note and recommendations for follow up for suspicion for potential  autoimmune condition: Fever/polyarthralgias/nodular lower extremity skin rash (?erythema nodosum): Suspicion for other autoimmune or infectious etiology.  Blood cultures negative so far.  CRP remains significantly elevated, RA factor is elevated, ANCA proteinase 3 also elevated.  Rest of autoimmune work-up was negative.  Consideration for a skin biopsy was given-however patient on 6/2 refused-is adamant about leaving the hospital.  Patient was started on IV steroids on 6/1-and arthralgias have improved-she still has some nodular painful rash in her lower extremities but feels much better.  She is requesting discharge-and is adamant about leaving the hospital today.  Discussed with nephrologist-Dr.  Annamarie Dawley will arrange for outpatient rheumatology follow-up-patient will be on tapering steroids-hopefully till rheumatology has seen the patient, nephrology plans on starting patient on IV vancomycin x2 weeks with hemodialysis for mild exit site line infection (preliminary cultures positive for staph aureus) Recommendations for Outpatient Follow-up:  Follow up with PCP in 1-2 weeks Please obtain CMP/CBC in one week Please ensure outpatient follow-up with rheumatology Please ensure outpatient follow-up with nephrology EBV PCR/CMV PCR, HCV PCR, parvovirus B19 antibody, cryoglobulin levels, SPEP, ANA-pending-please follow-up Discharge Instructions:   Activity:  As tolerated        Discharge Instructions     Diet - low sodium heart healthy   Complete by: As directed      Discharge instructions   Complete by: As directed      Follow with Primary MD  [name], FNP in 1-2 weeks   Follow with hemodialysis center as previous.   Your nephrologist is going to refer you to a rheumatologist-you should be getting a call from them soon.  If you do not hear from them-please reach out to your nephrologist office.  He will be on tapering doses of prednisone (steroids) until you are seen by a rheumatologist   Please get a complete blood count and chemistry panel checked by your Primary MD at your next visit, and again as instructed by your Primary MD.   Get Medicines reviewed and adjusted: Please take all your medications with you for your next visit with your Primary MD   Laboratory/radiological data: Please request your Primary MD to go over all hospital tests and procedure/radiological results at the follow up, please ask your Primary MD to get all Hospital records sent to his/her office.    Determined patient has a PCP post discharge follow up scheduled with [name] FNP on 06/21/21 Determined patient would like to stop taking Prednisone; Reviewed priscribed dosing/frequency upon  discharge: predniSONE 10 MG tablet Commonly known as: DELTASONE Take 4 tablets (40 mg) daily for 6 days, then, Take 3 tablets (30 mg) daily for 6 days, then, Take 2 tablets (20 mg) daily for 6 days, then, Take 1 tablets (10 mg) daily for 2 days, then stop Educated on potential SE related to abruptly stopping Prednisone and encouraged patient to discuss her concerns with her PCP provider, offered to check with provider to move up appointment, patient declines and states she will keep the current video visit scheduled for 06/21/21  Discussed plans with patient for ongoing care management follow up and provided patient with direct contact information for care management team Self Care Activities:  Self administers medications as prescribed Attends all scheduled provider appointments Calls pharmacy for medication refills Calls provider office for new concerns or questions Patient Goals: - follow up with Rheumatologist for evaluation and treatment of auto-immune condition   Follow Up Plan: Telephone follow up appointment with care management team member scheduled for: 07/17/21  Plan:Telephone follow up appointment with care management team member scheduled for:  07/17/21  [name], RN, BSN, CCM Care Management Coordinator [place] [facility]  Direct Phone: [phone]

## 2021-06-13 NOTE — Patient Instructions (Signed)
Goals Addressed      Autoimmune - Evaluated and treated   On track    Timeframe:  Long-Range Goal Priority:  High Start Date:  06/12/21                           Expected End Date: 06/12/22  Next follow up date: 07/17/21         Self Care Activities:  Self administers medications as prescribed Attends all scheduled provider appointments Calls pharmacy for medication refills Calls provider office for new concerns or questions Patient Goals: - follow up with Rheumatologist for evaluation and treatment of auto-immune condition

## 2021-06-14 ENCOUNTER — Telehealth: Payer: Self-pay

## 2021-06-15 DIAGNOSIS — Z992 Dependence on renal dialysis: Secondary | ICD-10-CM | POA: Diagnosis not present

## 2021-06-15 DIAGNOSIS — A498 Other bacterial infections of unspecified site: Secondary | ICD-10-CM | POA: Diagnosis not present

## 2021-06-15 DIAGNOSIS — N2581 Secondary hyperparathyroidism of renal origin: Secondary | ICD-10-CM | POA: Diagnosis not present

## 2021-06-15 DIAGNOSIS — R52 Pain, unspecified: Secondary | ICD-10-CM | POA: Diagnosis not present

## 2021-06-15 DIAGNOSIS — D689 Coagulation defect, unspecified: Secondary | ICD-10-CM | POA: Diagnosis not present

## 2021-06-15 DIAGNOSIS — N186 End stage renal disease: Secondary | ICD-10-CM | POA: Diagnosis not present

## 2021-06-18 DIAGNOSIS — D689 Coagulation defect, unspecified: Secondary | ICD-10-CM | POA: Diagnosis not present

## 2021-06-18 DIAGNOSIS — D631 Anemia in chronic kidney disease: Secondary | ICD-10-CM | POA: Diagnosis not present

## 2021-06-18 DIAGNOSIS — N2581 Secondary hyperparathyroidism of renal origin: Secondary | ICD-10-CM | POA: Diagnosis not present

## 2021-06-18 DIAGNOSIS — E875 Hyperkalemia: Secondary | ICD-10-CM | POA: Diagnosis not present

## 2021-06-18 DIAGNOSIS — N186 End stage renal disease: Secondary | ICD-10-CM | POA: Diagnosis not present

## 2021-06-18 DIAGNOSIS — Z992 Dependence on renal dialysis: Secondary | ICD-10-CM | POA: Diagnosis not present

## 2021-06-20 DIAGNOSIS — N2581 Secondary hyperparathyroidism of renal origin: Secondary | ICD-10-CM | POA: Diagnosis not present

## 2021-06-20 DIAGNOSIS — D631 Anemia in chronic kidney disease: Secondary | ICD-10-CM | POA: Diagnosis not present

## 2021-06-20 DIAGNOSIS — Z992 Dependence on renal dialysis: Secondary | ICD-10-CM | POA: Diagnosis not present

## 2021-06-20 DIAGNOSIS — N186 End stage renal disease: Secondary | ICD-10-CM | POA: Diagnosis not present

## 2021-06-20 DIAGNOSIS — E875 Hyperkalemia: Secondary | ICD-10-CM | POA: Diagnosis not present

## 2021-06-20 DIAGNOSIS — D689 Coagulation defect, unspecified: Secondary | ICD-10-CM | POA: Diagnosis not present

## 2021-06-21 ENCOUNTER — Telehealth (INDEPENDENT_AMBULATORY_CARE_PROVIDER_SITE_OTHER): Payer: Medicare Other | Admitting: Nurse Practitioner

## 2021-06-21 ENCOUNTER — Encounter: Payer: Self-pay | Admitting: Nurse Practitioner

## 2021-06-21 ENCOUNTER — Other Ambulatory Visit: Payer: Self-pay

## 2021-06-21 VITALS — BP 127/73

## 2021-06-21 DIAGNOSIS — E875 Hyperkalemia: Secondary | ICD-10-CM | POA: Diagnosis not present

## 2021-06-21 DIAGNOSIS — R531 Weakness: Secondary | ICD-10-CM | POA: Diagnosis not present

## 2021-06-21 NOTE — Progress Notes (Signed)
Telephone Visit due to failed mychart visit   This visit type was conducted due to national recommendations for restrictions regarding the COVID-19 Pandemic (e.g. social distancing) in an effort to limit this patient's exposure and mitigate transmission in our community.  Due to her co-morbid illnesses, this patient is at least at moderate risk for complications without adequate follow up.  This format is felt to be most appropriate for this patient at this time.  All issues noted in this document were discussed and addressed.  A limited physical exam was performed with this format.    This visit type was conducted due to national recommendations for restrictions regarding the COVID-19 Pandemic (e.g. social distancing) in an effort to limit this patient's exposure and mitigate transmission in our community.  Patients identity confirmed using two different identifiers.  This format is felt to be most appropriate for this patient at this time.  All issues noted in this document were discussed and addressed.  No physical exam was performed (except for noted visual exam findings with Video Visits).    Date:  08/24/2021   ID:  Whitney Walker, DOB 07-24-1956, MRN [id number]  Patient Location:  Home - spoke with Whitney Walker  Provider location:   Office    Chief Complaint:  hospital follow up  History of Present Illness:    Whitney Walker is a 65 y.o. female who presents via telephone conferencing for a telehealth visit today.    The patient does not have symptoms concerning for COVID-19 infection (fever, chills, cough, or new shortness of breath).   Telephone visit for hospital follow up she was admitted from 5/30-6/2 presented with bilateral painful nodular lower extremity rash, hyperkalemia. After admission she started developing a fever. During her hospitalization she also had diffuse arthralgia and was found to be have hyperkalemia.  She had a trial of empiric steroids on 6/1  then on 6/2 she was feeling better and was adamant to leave the hospital.    Since being discharged she has received a call from Rheumatology, she had several labs done in which were waiting on the results. Her rheumatoid factor was elevated. She was to have her CBC/CMP repeated but she did not want to come in to the office.  She reports she needs help with her ADLs and wanted help with PCS but she has been declined.  States if I stand up it hurts to get up. She is having to press on something to stand up.      Past Medical History:  Diagnosis Date   Anemia    Anxiety    CHF (congestive heart failure) ([place])    Colon cancer ([place])    treatment surgery   Complication of anesthesia    after first C- [place] "couldnt walk after", patient denies having a spinal   COPD (chronic obstructive pulmonary disease) ([place])    Coronary artery disease    Depression    DVT (deep venous thrombosis) ([facility])    ESRD (end stage renal disease) ([place])    Hemo: MWF   History of blood transfusion 04/2018   Hypertension    07/07/18- no longer takes BP medications   Meningitis    Pain in limb 07/30/2013   PE (pulmonary embolism)    Peripheral vascular disease ([facility])    Restless legs    Shortness of breath    with exertion   Sleep apnea    SOB (shortness of breath) 03/03/2019   Vertigo  Past Surgical History:  Procedure Laterality Date   ABDOMINAL AORTAGRAM N/A 07/26/2013   Procedure: ABDOMINAL AORTAGRAM;  Surgeon: [name] Anoka, MD;  Location: [facility] CATH LAB;  Service: Cardiovascular;  Laterality: N/A;   ABDOMINAL HYSTERECTOMY     AV FISTULA PLACEMENT Left 05/05/2018   Procedure: ARTERIOVENOUS (AV) FISTULA CREATION BRACHIOCEPHALIC;  Surgeon: Waynetta Sandy, MD;  Location: [place];  Service: Vascular;  Laterality: Left;   AV FISTULA PLACEMENT Left 07/16/2018   Procedure: ARTERIOVENOUS FISTULA CREATION;  Surgeon: Waynetta Sandy, MD;  Location: [place];  Service: Vascular;  Laterality: Left;    [place] Left 09/03/2018   Procedure: BASILIC VEIN TRANSPOSITION SECOND STAGE;  Surgeon: Waynetta Sandy, MD;  Location: [place];  Service: Vascular;  Laterality: Left;   BREAST BIOPSY Left    CESAREAN SECTION     X 3 1974-1977   CHOLECYSTECTOMY     CHOLECYSTECTOMY  1980/s   COLECTOMY  2010   DIVERTICULOSIS SURGERY-2002  2012   FEMORAL-POPLITEAL BYPASS GRAFT Left 08/13/2013   Procedure: BYPASS GRAFT FEMORAL-POPLITEAL ARTERY WITH NON-REVERSED SAPHANEOUS VEIN; ULTRASOUND GUIDED;  Surgeon: Mal [name], MD;  Location: [place];  Service: Vascular;  Laterality: Left;   INSERTION OF DIALYSIS CATHETER Right 04/30/2018   Procedure: INSERTION OF Right Internal Jugular DIALYSIS CATHETER;  Surgeon: [name], MD;  Location: [facility] OR;  Service: Vascular;  Laterality: Right;   IR FLUORO GUIDE CV LINE RIGHT  07/24/2020   IR FLUORO GUIDE CV LINE RIGHT  07/21/2021   IR REMOVAL TUN CV CATH W/O FL  07/21/2020   IR REMOVAL TUN CV CATH W/O FL  07/19/2021   IR US GUIDE VASC ACCESS RIGHT  07/24/2020   IR US GUIDE VASC ACCESS RIGHT  07/21/2021   LEFT HEART CATH AND CORONARY ANGIOGRAPHY N/A 05/01/2021   Procedure: LEFT HEART CATH AND CORONARY ANGIOGRAPHY;  Surgeon: [name], MD;  Location: [place] CV LAB;  Service: Cardiovascular;  Laterality: N/A;   LOWER EXTREMITY ANGIOGRAM Left 07/26/2013   Procedure: LOWER EXTREMITY ANGIOGRAM;  Surgeon: [name] [place], MD;  Location: [facility] CATH LAB;  Service: Cardiovascular;  Laterality: Left;     Current Meds  Medication Sig   acetaminophen (TYLENOL) 500 MG tablet Take 1,000 mg by mouth 2 (two) times daily as needed for moderate pain or headache.   albuterol (VENTOLIN HFA) 108 (90 Base) MCG/ACT inhaler Inhale 2 puffs into the lungs every 6 (six) hours as needed for wheezing or shortness of breath.   Ascorbic Acid (VITAMIN C) 100 MG tablet Take 100 mg by mouth daily.   AURYXIA 1 GM 210 MG(Fe) tablet Take 420-840 mg by mouth with breakfast, with lunch, and  with evening meal. Taking  2 tablets daily (420mg ) with each snack   Biotin 10000 MCG TABS Take 10,000 mcg by mouth daily.    meclizine (ANTIVERT) 12.5 MG tablet Take 1 tablet (12.5 mg total) by mouth 3 (three) times daily as needed for dizziness.   multivitamin (RENA-VIT) TABS tablet Take 1 tablet by mouth at bedtime.   mupirocin ointment (BACTROBAN) 2 % Apply 1 application topically daily as needed (skin bumps).   vitamin E 200 UNIT capsule Take 200 Units by mouth daily.   [DISCONTINUED] amLODipine (NORVASC) 5 MG tablet Take 1 tablet (5 mg total) by mouth daily. (Patient not taking: No sig reported)   [DISCONTINUED] apixaban (ELIQUIS) 2.5 MG TABS tablet Take 1 tablet (2.5 mg total) by mouth 2 (two) times daily.   [DISCONTINUED]  atorvastatin (LIPITOR) 10 MG tablet Take 1 tablet (10 mg total) by mouth daily.   [DISCONTINUED] carvedilol (COREG) 12.5 MG tablet Take 12.5 mg by mouth 2 (two) times daily with a meal.    [DISCONTINUED] lanthanum (FOSRENOL) 1000 MG chewable tablet Chew 1,000 mg by mouth See admin instructions. Take 1000 mg with each meal and each snack   [DISCONTINUED] nystatin (NYSTATIN) powder Apply 1 application topically 4 (four) times daily as needed. (Patient taking differently: Apply 1 application topically 4 (four) times daily as needed (irriation).)   [DISCONTINUED] omeprazole (PRILOSEC) 20 MG capsule Take 1 capsule (20 mg total) by mouth daily.   [DISCONTINUED] predniSONE (DELTASONE) 10 MG tablet Take 4 tablets (40 mg) daily for 6 days, then, Take 3 tablets (30 mg) daily for 6 days, then, Take 2 tablets (20 mg) daily for 6 days, then, Take 1 tablets (10 mg) daily for 2 days, then stop (Patient not taking: No sig reported)     Allergies:   Carnosine, Gadolinium derivatives, Iohexol, Iodine, and Naltrexone   Social History   Tobacco Use   Smoking status: Some Days    Packs/day: 0.50    Years: 50.00    Pack years: 25.00    Types: Cigarettes    Last attempt to quit: 02/03/2021     Years since quitting: 0.5   Smokeless tobacco: Former  Scientific laboratory technician Use: Never used  Substance Use Topics   Alcohol use: No    Alcohol/week: 0.0 standard drinks    Comment: she used to drink alcohol, quit in 2010; h/o heavy use    Drug use: Not Currently    Types: Oxycodone     Family Hx: The patient's family history includes Bleeding Disorder in her sister; Cancer (age of onset: 71) in her cousin; Cancer (age of onset: 78) in her mother; Cancer (age of onset: 32) in her father; Hypertension in her daughter and sister. There is no history of Breast cancer.  ROS:   Please see the history of present illness.    Review of Systems  Constitutional: Negative.   Respiratory: Negative.    Cardiovascular: Negative.   Musculoskeletal: Negative.   Neurological: Negative.  Negative for dizziness and tingling.  Psychiatric/Behavioral: Negative.     All other systems reviewed and are negative.   Labs/Other Tests and Data Reviewed:    Recent Labs: 10/30/2020: B Natriuretic Peptide 406.5 07/16/2021: ALT 21 07/21/2021: Magnesium 1.8 07/22/2021: BUN 18; Creatinine, Ser 6.33; Hemoglobin 10.2; Platelets 184; Potassium 4.0; Sodium 134   Recent Lipid Panel Lab Results  Component Value Date/Time   CHOL 183 03/15/2021 11:43 AM   TRIG 159 (H) 03/15/2021 11:43 AM   HDL 55 03/15/2021 11:43 AM   CHOLHDL 3.3 03/15/2021 11:43 AM   CHOLHDL 5.6 09/08/2009 04:03 AM   LDLCALC 100 (H) 03/15/2021 11:43 AM    Wt Readings from Last 3 Encounters:  08/09/21 199 lb 11.8 oz (90.6 kg)  08/09/21 199 lb 12.8 oz (90.6 kg)  08/08/21 199 lb (90.3 kg)     Exam:    Vital Signs:  BP 127/73     Physical Exam Vitals reviewed.  Constitutional:      General: She is not in acute distress.    Appearance: Normal appearance. She is obese.  Pulmonary:     Effort: Pulmonary effort is normal. No respiratory distress.  Neurological:     General: No focal deficit present.     Mental Status: She is alert and  oriented to  person, place, and time.  Psychiatric:        Mood and Affect: Mood and affect normal.        Behavior: Behavior normal.        Thought Content: Thought content normal.        Cognition and Memory: Memory normal.        Judgment: Judgment normal.    ASSESSMENT & PLAN:     1. Weakness No TCM done, she has continued weakness and will attempt to get her PCS I am unable to visualize the patient as this was a telephone visit. She is to continue follow up with Rheumatology and Nephrology. She was originally scheduled to be seen on 6/14 but was not seen until 6/23.   2. Hyperkalemia She does have her labs done at dialysis, order was placed for repeat CMP but she did not go to get done. Encouraged to make sure she is following up with Nephrology.    COVID-19 Education: The signs and symptoms of COVID-19 were discussed with the patient and how to seek care for testing (follow up with PCP or arrange E-visit).  The importance of social distancing was discussed today.  Patient Risk:   After full review of this patients clinical status, I feel that they are at least moderate risk at this time.  Time:   Today, I have spent 15 minutes/ seconds with the patient with telehealth technology discussing above diagnoses.     Medication Adjustments/Labs and Tests Ordered: Current medicines are reviewed at length with the patient today.  Concerns regarding medicines are outlined above.   Tests Ordered: No orders of the defined types were placed in this encounter.   Medication Changes: No orders of the defined types were placed in this encounter.   Disposition:  Follow up prn  Signed, [name], FNP

## 2021-06-22 DIAGNOSIS — D631 Anemia in chronic kidney disease: Secondary | ICD-10-CM | POA: Diagnosis not present

## 2021-06-22 DIAGNOSIS — E875 Hyperkalemia: Secondary | ICD-10-CM | POA: Diagnosis not present

## 2021-06-22 DIAGNOSIS — N186 End stage renal disease: Secondary | ICD-10-CM | POA: Diagnosis not present

## 2021-06-22 DIAGNOSIS — N2581 Secondary hyperparathyroidism of renal origin: Secondary | ICD-10-CM | POA: Diagnosis not present

## 2021-06-22 DIAGNOSIS — Z992 Dependence on renal dialysis: Secondary | ICD-10-CM | POA: Diagnosis not present

## 2021-06-22 DIAGNOSIS — D689 Coagulation defect, unspecified: Secondary | ICD-10-CM | POA: Diagnosis not present

## 2021-06-25 DIAGNOSIS — D689 Coagulation defect, unspecified: Secondary | ICD-10-CM | POA: Diagnosis not present

## 2021-06-25 DIAGNOSIS — E875 Hyperkalemia: Secondary | ICD-10-CM | POA: Diagnosis not present

## 2021-06-25 DIAGNOSIS — Z992 Dependence on renal dialysis: Secondary | ICD-10-CM | POA: Diagnosis not present

## 2021-06-25 DIAGNOSIS — N2581 Secondary hyperparathyroidism of renal origin: Secondary | ICD-10-CM | POA: Diagnosis not present

## 2021-06-25 DIAGNOSIS — N186 End stage renal disease: Secondary | ICD-10-CM | POA: Diagnosis not present

## 2021-06-27 DIAGNOSIS — N2581 Secondary hyperparathyroidism of renal origin: Secondary | ICD-10-CM | POA: Diagnosis not present

## 2021-06-27 DIAGNOSIS — N186 End stage renal disease: Secondary | ICD-10-CM | POA: Diagnosis not present

## 2021-06-27 DIAGNOSIS — Z992 Dependence on renal dialysis: Secondary | ICD-10-CM | POA: Diagnosis not present

## 2021-06-27 DIAGNOSIS — D689 Coagulation defect, unspecified: Secondary | ICD-10-CM | POA: Diagnosis not present

## 2021-06-27 DIAGNOSIS — E875 Hyperkalemia: Secondary | ICD-10-CM | POA: Diagnosis not present

## 2021-06-28 ENCOUNTER — Other Ambulatory Visit: Payer: Self-pay | Admitting: Nurse Practitioner

## 2021-06-28 DIAGNOSIS — Z992 Dependence on renal dialysis: Secondary | ICD-10-CM | POA: Diagnosis not present

## 2021-06-28 DIAGNOSIS — E875 Hyperkalemia: Secondary | ICD-10-CM

## 2021-06-28 DIAGNOSIS — I10 Essential (primary) hypertension: Secondary | ICD-10-CM

## 2021-06-28 DIAGNOSIS — I129 Hypertensive chronic kidney disease with stage 1 through stage 4 chronic kidney disease, or unspecified chronic kidney disease: Secondary | ICD-10-CM | POA: Diagnosis not present

## 2021-06-28 DIAGNOSIS — N186 End stage renal disease: Secondary | ICD-10-CM | POA: Diagnosis not present

## 2021-06-28 DIAGNOSIS — I5032 Chronic diastolic (congestive) heart failure: Secondary | ICD-10-CM

## 2021-06-29 DIAGNOSIS — R7881 Bacteremia: Secondary | ICD-10-CM | POA: Diagnosis not present

## 2021-06-29 DIAGNOSIS — N186 End stage renal disease: Secondary | ICD-10-CM | POA: Diagnosis not present

## 2021-06-29 DIAGNOSIS — E875 Hyperkalemia: Secondary | ICD-10-CM | POA: Diagnosis not present

## 2021-06-29 DIAGNOSIS — Z992 Dependence on renal dialysis: Secondary | ICD-10-CM | POA: Diagnosis not present

## 2021-06-29 DIAGNOSIS — D689 Coagulation defect, unspecified: Secondary | ICD-10-CM | POA: Diagnosis not present

## 2021-06-29 DIAGNOSIS — N2581 Secondary hyperparathyroidism of renal origin: Secondary | ICD-10-CM | POA: Diagnosis not present

## 2021-06-30 ENCOUNTER — Telehealth: Payer: Self-pay

## 2021-06-30 NOTE — Chronic Care Management (AMB) (Signed)
Chronic Care Management Pharmacy Assistant   Name: Whitney Walker  MRN: [id number] DOB: 08/03/1956  Reason for Encounter: Medication Review   Recent office visits:  06/21/2021- [name], FNP (PCP)- No medication changes.  06/12/2021- [name], RN (CCM)  Recent consult visits:  06/20/2021- Clinta Nicole Iran, RN [facility])- No medication changes.   Hospital visits:  Medication Reconciliation was completed by comparing discharge summary, patient's EMR and Pharmacy list, and upon discussion with patient.  Admitted to the hospital on 05-28-2021 due to Hyperkalemia . Discharge date was 05-31-2021. Discharged from [place]?Medications Started at [facility] Discharge:?? Prednisone 10 MG- 4 tablets for 6 days, 3 tablets for 6 days, 2 tablets for 6 days then 1 tablet for 2 days   Medication Changes at Hospital Discharge: None   Medications Discontinued at Hospital Discharge: None  Admitted to the hospital on 05-01-2021 due to Acute deep vein thrombosis of femoral vein of left lower extremity. Discharge date was 05-01-2021. Discharged from [place]?Medications Started at [facility] Discharge:?? None   Medication Changes at Hospital Discharge: None   Medications Discontinued at Hospital Discharge: -Stopped Amoxicillin 500 MG. Stopped Ibuprofen 800 MG  Medications that remain the same after Hospital Discharge:??  -All other medications will remain the same.    Reviewed chart for medication changes ahead of medication coordination call. No medication changes indicated.  BP Readings from Last 3 Encounters:  06/21/21 127/73  05/31/21 (!) 166/101  05/17/21 114/78    Lab Results  Component Value Date   HGBA1C 5.1 06/04/2018     Patient obtains medications through [facility]  30 Days   Last adherence delivery included:  Amlodipine 5 mg- 1 tablets daily Atorvastatin 10 mg- 1 tablet daily Coordinated future acute  fill forms for Carvedilol 12.5 mg to be delivered on 06/15/2021 and Eliquis 2.5 mg to be delivered on 06/20/2021.  Patient declined the following medications last month:  Albuterol inhaler 108 mcg- inhale 2 puffs every 6 hours due to PRN use. Lunesta 2 mg- 1 tablet nightly due to not helping with Insomnia patient discontinued Nystatin 100 gm- apply four times a day as needed. Meclizine 12.5 mg- 1 tablet three times a day for dizziness due to PRN use.  Ondansetron 4 mg-  Dissolve 1 tablet under tongue every 6 hours due to PRN use. Azithromycin 250 mg- due to completed course. Amoxicillin 500 mg- due to completed course. Atorvastatin 20 mg- due to no longer taking this strength, patient taking 10 mg- requested new prescription. Ibuprofen 800 mg- 1 tablet by mouth three times daily due to completing course per Lawerance Cruel (Cardiology) Pantoprazole 40 mg- 1 tablet by mouth daily due to completing course per Lawerance Cruel (Cardiology)  Patient is due for next adherence delivery on: 07/11/2021. Patient will be contacted by [facility] for delivery.   Medications: Outpatient Encounter Medications as of 06/30/2021  Medication Sig Note   acetaminophen (TYLENOL) 500 MG tablet Take 1,000 mg by mouth 2 (two) times daily as needed for moderate pain or headache.    albuterol (VENTOLIN HFA) 108 (90 Base) MCG/ACT inhaler Inhale 2 puffs into the lungs every 6 (six) hours as needed for wheezing or shortness of breath.    amLODipine (NORVASC) 5 MG tablet Take 1 tablet (5 mg total) by mouth daily.    apixaban (ELIQUIS) 2.5 MG TABS tablet Take 1 tablet (2.5 mg total) by mouth 2 (two) times daily.  Ascorbic Acid (VITAMIN C) 100 MG tablet Take 100 mg by mouth daily.    atorvastatin (LIPITOR) 10 MG tablet Take 1 tablet (10 mg total) by mouth daily.    AURYXIA 1 GM 210 MG(Fe) tablet Take 420-840 mg by mouth with breakfast, with lunch, and with evening meal. Taking  2 tablets daily (420mg ) with each snack     Biotin 10000 MCG TABS Take 10,000 mcg by mouth daily.     carvedilol (COREG) 12.5 MG tablet Take 12.5 mg by mouth 2 (two) times daily with a meal.     lanthanum (FOSRENOL) 1000 MG chewable tablet Chew 1,000 mg by mouth See admin instructions. Take 1000 mg with each meal and each snack 05/28/2021: Stopped last week 05-21-21   meclizine (ANTIVERT) 12.5 MG tablet Take 1 tablet (12.5 mg total) by mouth 3 (three) times daily as needed for dizziness.    multivitamin (RENA-VIT) TABS tablet Take 1 tablet by mouth at bedtime.    mupirocin ointment (BACTROBAN) 2 % Apply 1 application topically daily as needed (skin bumps).    nystatin (NYSTATIN) powder Apply 1 application topically 4 (four) times daily as needed. (Patient taking differently: Apply 1 application topically 4 (four) times daily as needed (irriation).)    omeprazole (PRILOSEC) 20 MG capsule Take 1 capsule (20 mg total) by mouth daily.    predniSONE (DELTASONE) 10 MG tablet Take 4 tablets (40 mg) daily for 6 days, then, Take 3 tablets (30 mg) daily for 6 days, then, Take 2 tablets (20 mg) daily for 6 days, then, Take 1 tablets (10 mg) daily for 2 days, then stop    vitamin E 200 UNIT capsule Take 200 Units by mouth daily.    No facility-administered encounter medications on file as of 06/30/2021.    Star Rating Drugs: Atorvastatin 20 MG- Last filled 06/07/2021 for 30 day supply at [facility].  SIG: [name], [place] Pharmacist Assistant [phone]

## 2021-07-03 ENCOUNTER — Other Ambulatory Visit (HOSPITAL_COMMUNITY): Payer: Medicare Other

## 2021-07-03 ENCOUNTER — Ambulatory Visit: Payer: Medicare Other | Admitting: Vascular Surgery

## 2021-07-04 DIAGNOSIS — Z992 Dependence on renal dialysis: Secondary | ICD-10-CM | POA: Diagnosis not present

## 2021-07-04 DIAGNOSIS — R7881 Bacteremia: Secondary | ICD-10-CM | POA: Diagnosis not present

## 2021-07-04 DIAGNOSIS — N2581 Secondary hyperparathyroidism of renal origin: Secondary | ICD-10-CM | POA: Diagnosis not present

## 2021-07-04 DIAGNOSIS — D689 Coagulation defect, unspecified: Secondary | ICD-10-CM | POA: Diagnosis not present

## 2021-07-04 DIAGNOSIS — E875 Hyperkalemia: Secondary | ICD-10-CM | POA: Diagnosis not present

## 2021-07-04 DIAGNOSIS — N186 End stage renal disease: Secondary | ICD-10-CM | POA: Diagnosis not present

## 2021-07-06 DIAGNOSIS — N186 End stage renal disease: Secondary | ICD-10-CM | POA: Diagnosis not present

## 2021-07-06 DIAGNOSIS — D689 Coagulation defect, unspecified: Secondary | ICD-10-CM | POA: Diagnosis not present

## 2021-07-06 DIAGNOSIS — E875 Hyperkalemia: Secondary | ICD-10-CM | POA: Diagnosis not present

## 2021-07-06 DIAGNOSIS — R7881 Bacteremia: Secondary | ICD-10-CM | POA: Diagnosis not present

## 2021-07-06 DIAGNOSIS — N2581 Secondary hyperparathyroidism of renal origin: Secondary | ICD-10-CM | POA: Diagnosis not present

## 2021-07-06 DIAGNOSIS — Z992 Dependence on renal dialysis: Secondary | ICD-10-CM | POA: Diagnosis not present

## 2021-07-09 DIAGNOSIS — Z992 Dependence on renal dialysis: Secondary | ICD-10-CM | POA: Diagnosis not present

## 2021-07-09 DIAGNOSIS — R7881 Bacteremia: Secondary | ICD-10-CM | POA: Diagnosis not present

## 2021-07-09 DIAGNOSIS — N186 End stage renal disease: Secondary | ICD-10-CM | POA: Diagnosis not present

## 2021-07-09 DIAGNOSIS — D689 Coagulation defect, unspecified: Secondary | ICD-10-CM | POA: Diagnosis not present

## 2021-07-09 DIAGNOSIS — N2581 Secondary hyperparathyroidism of renal origin: Secondary | ICD-10-CM | POA: Diagnosis not present

## 2021-07-09 DIAGNOSIS — E875 Hyperkalemia: Secondary | ICD-10-CM | POA: Diagnosis not present

## 2021-07-11 DIAGNOSIS — N2581 Secondary hyperparathyroidism of renal origin: Secondary | ICD-10-CM | POA: Diagnosis not present

## 2021-07-11 DIAGNOSIS — Z992 Dependence on renal dialysis: Secondary | ICD-10-CM | POA: Diagnosis not present

## 2021-07-11 DIAGNOSIS — N186 End stage renal disease: Secondary | ICD-10-CM | POA: Diagnosis not present

## 2021-07-11 DIAGNOSIS — E875 Hyperkalemia: Secondary | ICD-10-CM | POA: Diagnosis not present

## 2021-07-11 DIAGNOSIS — D689 Coagulation defect, unspecified: Secondary | ICD-10-CM | POA: Diagnosis not present

## 2021-07-11 DIAGNOSIS — R7881 Bacteremia: Secondary | ICD-10-CM | POA: Diagnosis not present

## 2021-07-13 DIAGNOSIS — E875 Hyperkalemia: Secondary | ICD-10-CM | POA: Diagnosis not present

## 2021-07-13 DIAGNOSIS — D689 Coagulation defect, unspecified: Secondary | ICD-10-CM | POA: Diagnosis not present

## 2021-07-13 DIAGNOSIS — N186 End stage renal disease: Secondary | ICD-10-CM | POA: Diagnosis not present

## 2021-07-13 DIAGNOSIS — Z992 Dependence on renal dialysis: Secondary | ICD-10-CM | POA: Diagnosis not present

## 2021-07-13 DIAGNOSIS — N2581 Secondary hyperparathyroidism of renal origin: Secondary | ICD-10-CM | POA: Diagnosis not present

## 2021-07-13 DIAGNOSIS — R7881 Bacteremia: Secondary | ICD-10-CM | POA: Diagnosis not present

## 2021-07-16 ENCOUNTER — Encounter (HOSPITAL_COMMUNITY): Payer: Self-pay | Admitting: Emergency Medicine

## 2021-07-16 ENCOUNTER — Emergency Department (HOSPITAL_COMMUNITY): Payer: Medicare Other

## 2021-07-16 ENCOUNTER — Other Ambulatory Visit: Payer: Self-pay

## 2021-07-16 ENCOUNTER — Inpatient Hospital Stay (HOSPITAL_COMMUNITY)
Admission: EM | Admit: 2021-07-16 | Discharge: 2021-07-22 | DRG: 314 | Disposition: A | Discharge status: Home Health | Payer: Medicare Other | Attending: Family Medicine | Admitting: Family Medicine

## 2021-07-16 ENCOUNTER — Inpatient Hospital Stay (HOSPITAL_COMMUNITY): Payer: Medicare Other

## 2021-07-16 DIAGNOSIS — E1165 Type 2 diabetes mellitus with hyperglycemia: Secondary | ICD-10-CM | POA: Diagnosis not present

## 2021-07-16 DIAGNOSIS — T80211A Bloodstream infection due to central venous catheter, initial encounter: Secondary | ICD-10-CM | POA: Diagnosis not present

## 2021-07-16 DIAGNOSIS — A4101 Sepsis due to Methicillin susceptible Staphylococcus aureus: Secondary | ICD-10-CM | POA: Diagnosis present

## 2021-07-16 DIAGNOSIS — R000 Tachycardia, unspecified: Secondary | ICD-10-CM | POA: Diagnosis not present

## 2021-07-16 DIAGNOSIS — Q8909 Congenital malformations of spleen: Secondary | ICD-10-CM | POA: Diagnosis not present

## 2021-07-16 DIAGNOSIS — E1122 Type 2 diabetes mellitus with diabetic chronic kidney disease: Secondary | ICD-10-CM | POA: Diagnosis not present

## 2021-07-16 DIAGNOSIS — I2609 Other pulmonary embolism with acute cor pulmonale: Secondary | ICD-10-CM | POA: Diagnosis not present

## 2021-07-16 DIAGNOSIS — F17210 Nicotine dependence, cigarettes, uncomplicated: Secondary | ICD-10-CM | POA: Diagnosis not present

## 2021-07-16 DIAGNOSIS — R918 Other nonspecific abnormal finding of lung field: Secondary | ICD-10-CM | POA: Diagnosis not present

## 2021-07-16 DIAGNOSIS — R509 Fever, unspecified: Secondary | ICD-10-CM | POA: Diagnosis not present

## 2021-07-16 DIAGNOSIS — Z85038 Personal history of other malignant neoplasm of large intestine: Secondary | ICD-10-CM

## 2021-07-16 DIAGNOSIS — I700 Atherosclerosis of aorta: Secondary | ICD-10-CM | POA: Diagnosis not present

## 2021-07-16 DIAGNOSIS — E860 Dehydration: Secondary | ICD-10-CM | POA: Diagnosis not present

## 2021-07-16 DIAGNOSIS — M47816 Spondylosis without myelopathy or radiculopathy, lumbar region: Secondary | ICD-10-CM | POA: Diagnosis present

## 2021-07-16 DIAGNOSIS — D638 Anemia in other chronic diseases classified elsewhere: Secondary | ICD-10-CM | POA: Diagnosis not present

## 2021-07-16 DIAGNOSIS — G4730 Sleep apnea, unspecified: Secondary | ICD-10-CM | POA: Diagnosis not present

## 2021-07-16 DIAGNOSIS — M5127 Other intervertebral disc displacement, lumbosacral region: Secondary | ICD-10-CM | POA: Diagnosis not present

## 2021-07-16 DIAGNOSIS — Y841 Kidney dialysis as the cause of abnormal reaction of the patient, or of later complication, without mention of misadventure at the time of the procedure: Secondary | ICD-10-CM | POA: Diagnosis present

## 2021-07-16 DIAGNOSIS — N186 End stage renal disease: Secondary | ICD-10-CM | POA: Diagnosis not present

## 2021-07-16 DIAGNOSIS — B9561 Methicillin susceptible Staphylococcus aureus infection as the cause of diseases classified elsewhere: Secondary | ICD-10-CM | POA: Diagnosis not present

## 2021-07-16 DIAGNOSIS — B999 Unspecified infectious disease: Secondary | ICD-10-CM

## 2021-07-16 DIAGNOSIS — Z452 Encounter for adjustment and management of vascular access device: Secondary | ICD-10-CM | POA: Diagnosis not present

## 2021-07-16 DIAGNOSIS — R079 Chest pain, unspecified: Secondary | ICD-10-CM | POA: Diagnosis not present

## 2021-07-16 DIAGNOSIS — G4733 Obstructive sleep apnea (adult) (pediatric): Secondary | ICD-10-CM | POA: Diagnosis not present

## 2021-07-16 DIAGNOSIS — I2510 Atherosclerotic heart disease of native coronary artery without angina pectoris: Secondary | ICD-10-CM | POA: Diagnosis present

## 2021-07-16 DIAGNOSIS — I5032 Chronic diastolic (congestive) heart failure: Secondary | ICD-10-CM | POA: Diagnosis present

## 2021-07-16 DIAGNOSIS — E871 Hypo-osmolality and hyponatremia: Secondary | ICD-10-CM | POA: Diagnosis not present

## 2021-07-16 DIAGNOSIS — Z86711 Personal history of pulmonary embolism: Secondary | ICD-10-CM

## 2021-07-16 DIAGNOSIS — N261 Atrophy of kidney (terminal): Secondary | ICD-10-CM | POA: Diagnosis not present

## 2021-07-16 DIAGNOSIS — J449 Chronic obstructive pulmonary disease, unspecified: Secondary | ICD-10-CM | POA: Diagnosis not present

## 2021-07-16 DIAGNOSIS — R911 Solitary pulmonary nodule: Secondary | ICD-10-CM | POA: Diagnosis not present

## 2021-07-16 DIAGNOSIS — Z20822 Contact with and (suspected) exposure to covid-19: Secondary | ICD-10-CM | POA: Diagnosis not present

## 2021-07-16 DIAGNOSIS — Z992 Dependence on renal dialysis: Secondary | ICD-10-CM

## 2021-07-16 DIAGNOSIS — I7789 Other specified disorders of arteries and arterioles: Secondary | ICD-10-CM | POA: Diagnosis not present

## 2021-07-16 DIAGNOSIS — M545 Low back pain, unspecified: Secondary | ICD-10-CM | POA: Diagnosis not present

## 2021-07-16 DIAGNOSIS — G2581 Restless legs syndrome: Secondary | ICD-10-CM | POA: Diagnosis present

## 2021-07-16 DIAGNOSIS — J9811 Atelectasis: Secondary | ICD-10-CM | POA: Diagnosis not present

## 2021-07-16 DIAGNOSIS — Z4901 Encounter for fitting and adjustment of extracorporeal dialysis catheter: Secondary | ICD-10-CM | POA: Diagnosis not present

## 2021-07-16 DIAGNOSIS — R6521 Severe sepsis with septic shock: Secondary | ICD-10-CM

## 2021-07-16 DIAGNOSIS — J9690 Respiratory failure, unspecified, unspecified whether with hypoxia or hypercapnia: Secondary | ICD-10-CM | POA: Diagnosis not present

## 2021-07-16 DIAGNOSIS — M5126 Other intervertebral disc displacement, lumbar region: Secondary | ICD-10-CM | POA: Diagnosis present

## 2021-07-16 DIAGNOSIS — J811 Chronic pulmonary edema: Secondary | ICD-10-CM | POA: Diagnosis not present

## 2021-07-16 DIAGNOSIS — E875 Hyperkalemia: Secondary | ICD-10-CM | POA: Diagnosis not present

## 2021-07-16 DIAGNOSIS — A419 Sepsis, unspecified organism: Secondary | ICD-10-CM | POA: Diagnosis not present

## 2021-07-16 DIAGNOSIS — Z6839 Body mass index (BMI) 39.0-39.9, adult: Secondary | ICD-10-CM

## 2021-07-16 DIAGNOSIS — Z7901 Long term (current) use of anticoagulants: Secondary | ICD-10-CM

## 2021-07-16 DIAGNOSIS — I10 Essential (primary) hypertension: Secondary | ICD-10-CM | POA: Diagnosis present

## 2021-07-16 DIAGNOSIS — E785 Hyperlipidemia, unspecified: Secondary | ICD-10-CM | POA: Diagnosis not present

## 2021-07-16 DIAGNOSIS — Z9049 Acquired absence of other specified parts of digestive tract: Secondary | ICD-10-CM

## 2021-07-16 DIAGNOSIS — I120 Hypertensive chronic kidney disease with stage 5 chronic kidney disease or end stage renal disease: Secondary | ICD-10-CM | POA: Diagnosis present

## 2021-07-16 DIAGNOSIS — R7881 Bacteremia: Secondary | ICD-10-CM

## 2021-07-16 DIAGNOSIS — R109 Unspecified abdominal pain: Secondary | ICD-10-CM | POA: Insufficient documentation

## 2021-07-16 DIAGNOSIS — R6520 Severe sepsis without septic shock: Secondary | ICD-10-CM | POA: Diagnosis not present

## 2021-07-16 DIAGNOSIS — L0291 Cutaneous abscess, unspecified: Secondary | ICD-10-CM

## 2021-07-16 DIAGNOSIS — U071 COVID-19: Secondary | ICD-10-CM | POA: Diagnosis not present

## 2021-07-16 DIAGNOSIS — Z743 Need for continuous supervision: Secondary | ICD-10-CM | POA: Diagnosis not present

## 2021-07-16 DIAGNOSIS — R6889 Other general symptoms and signs: Secondary | ICD-10-CM | POA: Diagnosis not present

## 2021-07-16 DIAGNOSIS — Z8249 Family history of ischemic heart disease and other diseases of the circulatory system: Secondary | ICD-10-CM

## 2021-07-16 DIAGNOSIS — M549 Dorsalgia, unspecified: Secondary | ICD-10-CM | POA: Diagnosis not present

## 2021-07-16 DIAGNOSIS — I499 Cardiac arrhythmia, unspecified: Secondary | ICD-10-CM | POA: Diagnosis not present

## 2021-07-16 DIAGNOSIS — K429 Umbilical hernia without obstruction or gangrene: Secondary | ICD-10-CM | POA: Diagnosis not present

## 2021-07-16 DIAGNOSIS — N289 Disorder of kidney and ureter, unspecified: Secondary | ICD-10-CM | POA: Diagnosis not present

## 2021-07-16 DIAGNOSIS — F419 Anxiety disorder, unspecified: Secondary | ICD-10-CM | POA: Diagnosis present

## 2021-07-16 DIAGNOSIS — D689 Coagulation defect, unspecified: Secondary | ICD-10-CM | POA: Diagnosis not present

## 2021-07-16 DIAGNOSIS — I517 Cardiomegaly: Secondary | ICD-10-CM | POA: Diagnosis not present

## 2021-07-16 DIAGNOSIS — N281 Cyst of kidney, acquired: Secondary | ICD-10-CM | POA: Diagnosis present

## 2021-07-16 DIAGNOSIS — D631 Anemia in chronic kidney disease: Secondary | ICD-10-CM | POA: Diagnosis not present

## 2021-07-16 DIAGNOSIS — M5441 Lumbago with sciatica, right side: Secondary | ICD-10-CM | POA: Diagnosis not present

## 2021-07-16 DIAGNOSIS — N2581 Secondary hyperparathyroidism of renal origin: Secondary | ICD-10-CM | POA: Diagnosis not present

## 2021-07-16 DIAGNOSIS — R0602 Shortness of breath: Secondary | ICD-10-CM | POA: Diagnosis not present

## 2021-07-16 DIAGNOSIS — F32A Depression, unspecified: Secondary | ICD-10-CM | POA: Diagnosis present

## 2021-07-16 DIAGNOSIS — R112 Nausea with vomiting, unspecified: Secondary | ICD-10-CM | POA: Insufficient documentation

## 2021-07-16 DIAGNOSIS — R531 Weakness: Secondary | ICD-10-CM | POA: Diagnosis not present

## 2021-07-16 DIAGNOSIS — Z90710 Acquired absence of both cervix and uterus: Secondary | ICD-10-CM

## 2021-07-16 DIAGNOSIS — Z86718 Personal history of other venous thrombosis and embolism: Secondary | ICD-10-CM

## 2021-07-16 DIAGNOSIS — Z79899 Other long term (current) drug therapy: Secondary | ICD-10-CM

## 2021-07-16 DIAGNOSIS — M898X9 Other specified disorders of bone, unspecified site: Secondary | ICD-10-CM | POA: Diagnosis present

## 2021-07-16 DIAGNOSIS — N250 Renal osteodystrophy: Secondary | ICD-10-CM | POA: Diagnosis not present

## 2021-07-16 DIAGNOSIS — I959 Hypotension, unspecified: Secondary | ICD-10-CM | POA: Diagnosis not present

## 2021-07-16 DIAGNOSIS — Z888 Allergy status to other drugs, medicaments and biological substances status: Secondary | ICD-10-CM

## 2021-07-16 DIAGNOSIS — I339 Acute and subacute endocarditis, unspecified: Secondary | ICD-10-CM | POA: Diagnosis not present

## 2021-07-16 LAB — CBC WITH DIFFERENTIAL/PLATELET
Abs Immature Granulocytes: 0.3 10*3/uL — ABNORMAL HIGH (ref 0.00–0.07)
Basophils Absolute: 0.1 10*3/uL (ref 0.0–0.1)
Basophils Relative: 0 %
Eosinophils Absolute: 0 10*3/uL (ref 0.0–0.5)
Eosinophils Relative: 0 %
HCT: 37.8 % (ref 36.0–46.0)
Hemoglobin: 11.9 g/dL — ABNORMAL LOW (ref 12.0–15.0)
Immature Granulocytes: 1 %
Lymphocytes Relative: 4 %
Lymphs Abs: 1.1 10*3/uL (ref 0.7–4.0)
MCH: 29 pg (ref 26.0–34.0)
MCHC: 31.5 g/dL (ref 30.0–36.0)
MCV: 92.2 fL (ref 80.0–100.0)
Monocytes Absolute: 1.6 10*3/uL — ABNORMAL HIGH (ref 0.1–1.0)
Monocytes Relative: 6 %
Neutro Abs: 23.8 10*3/uL — ABNORMAL HIGH (ref 1.7–7.7)
Neutrophils Relative %: 89 %
Platelets: 143 10*3/uL — ABNORMAL LOW (ref 150–400)
RBC: 4.1 MIL/uL (ref 3.87–5.11)
RDW: 16.4 % — ABNORMAL HIGH (ref 11.5–15.5)
WBC: 26.9 10*3/uL — ABNORMAL HIGH (ref 4.0–10.5)
nRBC: 0 % (ref 0.0–0.2)

## 2021-07-16 LAB — COMPREHENSIVE METABOLIC PANEL
ALT: 21 U/L (ref 0–44)
AST: 39 U/L (ref 15–41)
Albumin: 2.7 g/dL — ABNORMAL LOW (ref 3.5–5.0)
Alkaline Phosphatase: 93 U/L (ref 38–126)
Anion gap: 19 — ABNORMAL HIGH (ref 5–15)
BUN: 24 mg/dL — ABNORMAL HIGH (ref 8–23)
CO2: 19 mmol/L — ABNORMAL LOW (ref 22–32)
Calcium: 8.2 mg/dL — ABNORMAL LOW (ref 8.9–10.3)
Chloride: 94 mmol/L — ABNORMAL LOW (ref 98–111)
Creatinine, Ser: 7.31 mg/dL — ABNORMAL HIGH (ref 0.44–1.00)
GFR, Estimated: 6 mL/min — ABNORMAL LOW (ref >60)
Glucose, Bld: 99 mg/dL (ref 70–99)
Potassium: 4.8 mmol/L (ref 3.5–5.1)
Sodium: 132 mmol/L — ABNORMAL LOW (ref 135–145)
Total Bilirubin: 1.2 mg/dL (ref 0.3–1.2)
Total Protein: 7.8 g/dL (ref 6.5–8.1)

## 2021-07-16 LAB — LACTIC ACID, PLASMA
Lactic Acid, Venous: 3.3 mmol/L (ref 0.5–1.9)
Lactic Acid, Venous: 1.7 mmol/L (ref 0.5–1.9)

## 2021-07-16 LAB — C DIFFICILE QUICK SCREEN W PCR REFLEX
C Diff antigen: NEGATIVE
C Diff interpretation: NOT DETECTED
C Diff toxin: NEGATIVE

## 2021-07-16 LAB — GLUCOSE, CAPILLARY
Glucose-Capillary: 122 mg/dL — ABNORMAL HIGH (ref 70–99)
Glucose-Capillary: 138 mg/dL — ABNORMAL HIGH (ref 70–99)
Glucose-Capillary: 142 mg/dL — ABNORMAL HIGH (ref 70–99)

## 2021-07-16 LAB — HEMOGLOBIN A1C
Hgb A1c MFr Bld: 6.2 % — ABNORMAL HIGH (ref 4.8–5.6)
Mean Plasma Glucose: 131.24 mg/dL

## 2021-07-16 LAB — PROTIME-INR
INR: 1.3 — ABNORMAL HIGH (ref 0.8–1.2)
Prothrombin Time: 16.5 seconds — ABNORMAL HIGH (ref 11.4–15.2)

## 2021-07-16 LAB — APTT: aPTT: 24 seconds (ref 24–36)

## 2021-07-16 LAB — RESP PANEL BY RT-PCR (FLU A&B, COVID) ARPGX2
Influenza A by PCR: NEGATIVE
Influenza B by PCR: NEGATIVE
SARS Coronavirus 2 by RT PCR: NEGATIVE

## 2021-07-16 IMAGING — CT CT CHEST-ABD-PELV W/O CM
2 of 5 series · 14 of 46 positions shown, 16 images · non-contrast
Comparison: CT chest [DATE], CT cardiac [DATE], CT abdomen
pelvis [DATE]

CLINICAL DATA: Respiratory illness, nondiagnostic xray. Respiratory
illness/flank pain

EXAM:
CT CHEST, ABDOMEN AND PELVIS WITHOUT CONTRAST
TECHNIQUE: Multidetector CT imaging of the chest, abdomen and pelvis was
performed following the standard protocol without IV contrast.

[Series 5: cap w/o 2.0 mm st · axial · non-contrast · 0.98mm/px · z∈[+860,+1384]mm · 11 of 300 slices shown, 13 images]
[im 19/300  soft-tissue]
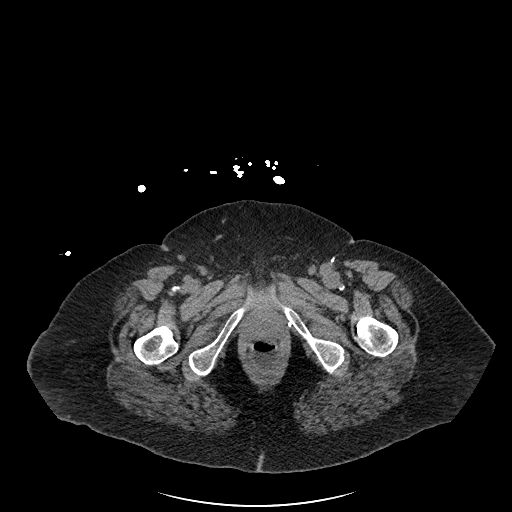
[im 19/300  bone]
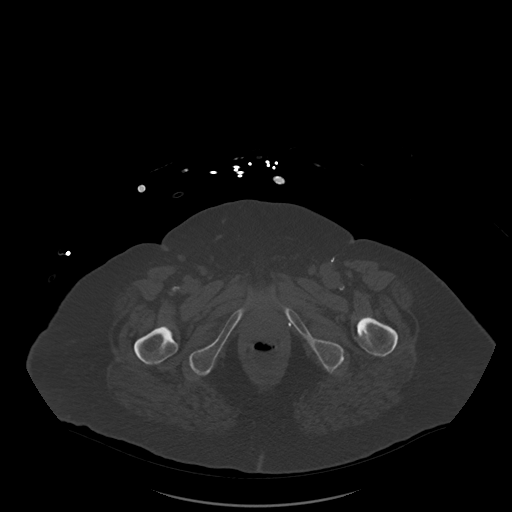
[im 57/300  soft-tissue]
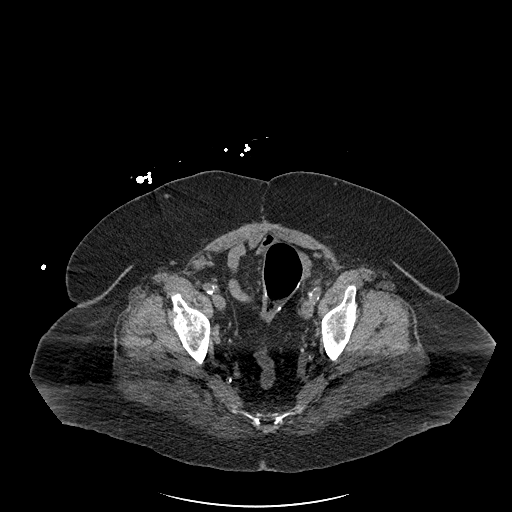
[im 75/300  soft-tissue]
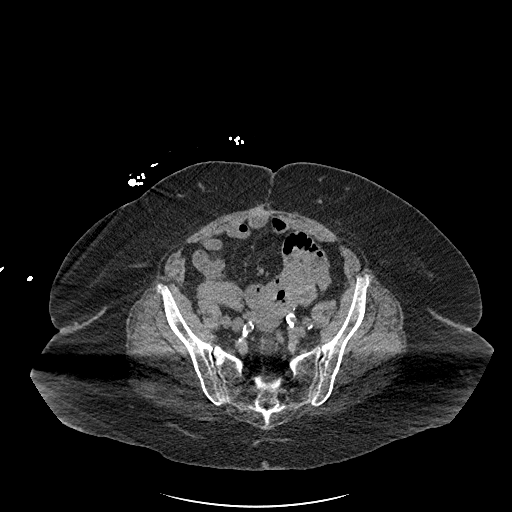
[im 94/300  soft-tissue]
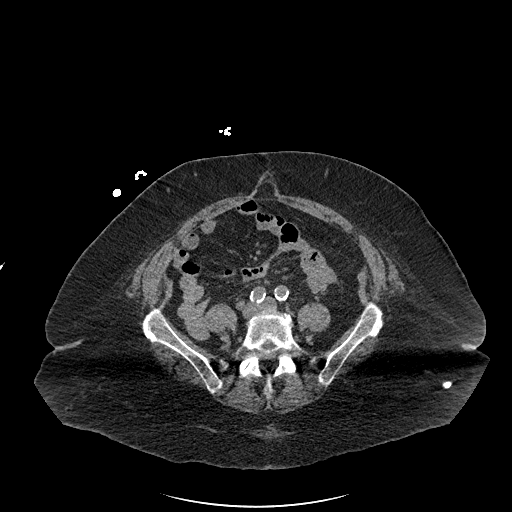
[im 131/300  soft-tissue]
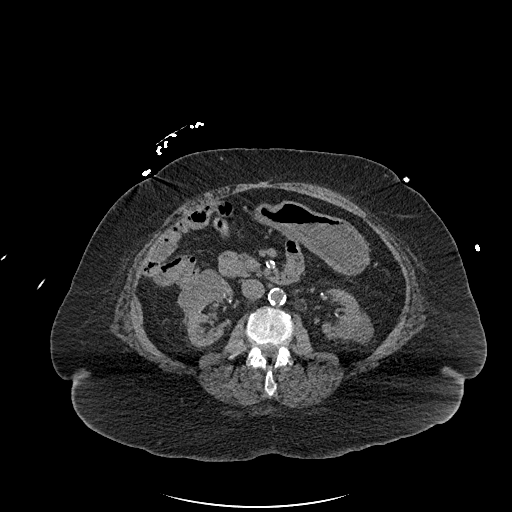
[im 150/300  soft-tissue]
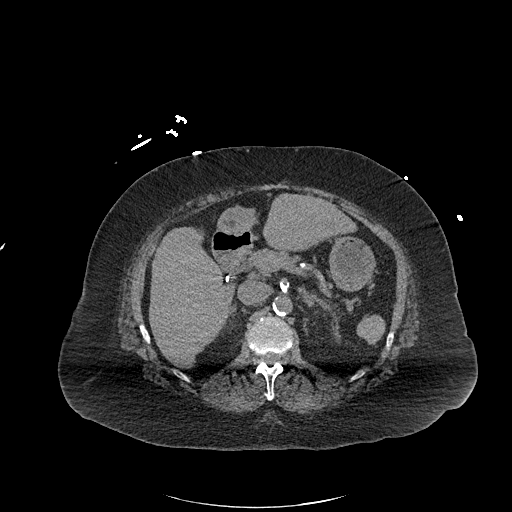
[im 169/300  soft-tissue]
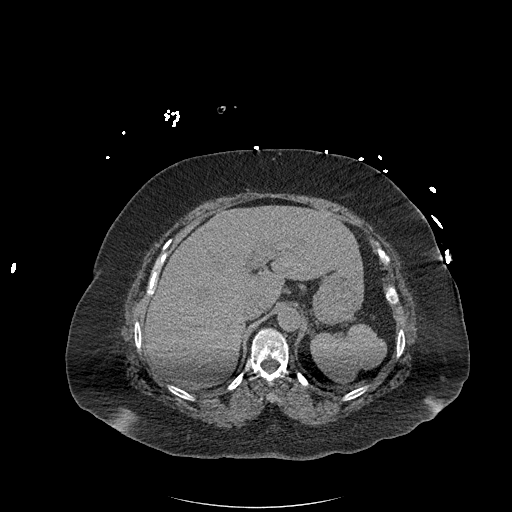
[im 206/300  soft-tissue]
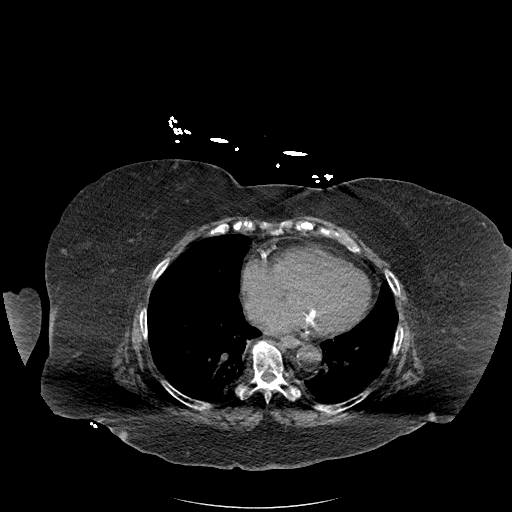
[im 225/300  soft-tissue]
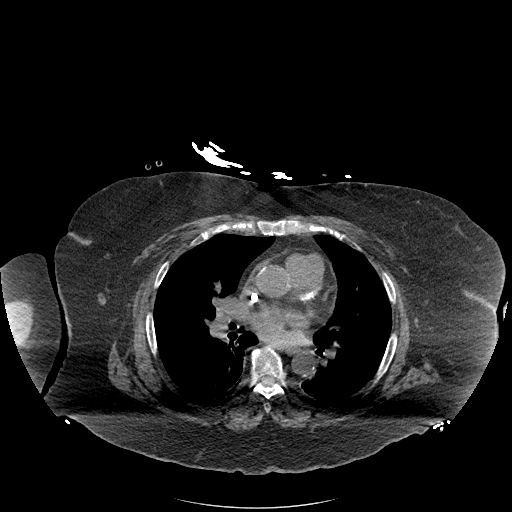
[im 225/300  bone]
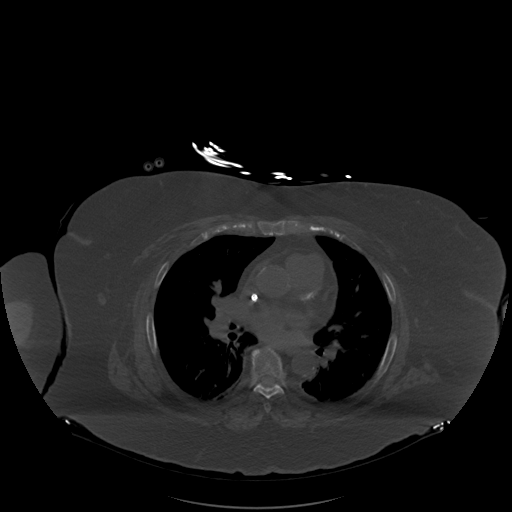
[im 243/300  soft-tissue]
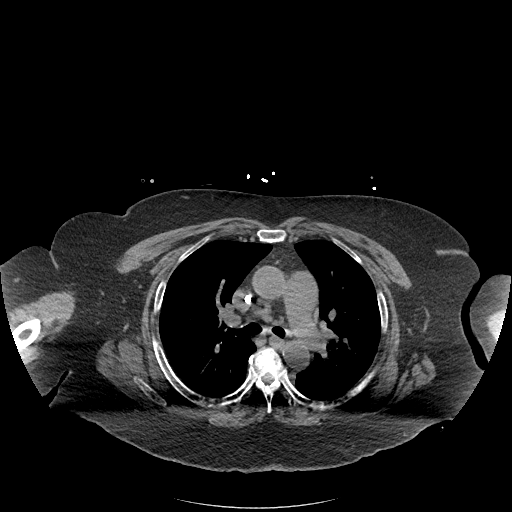
[im 281/300  soft-tissue]
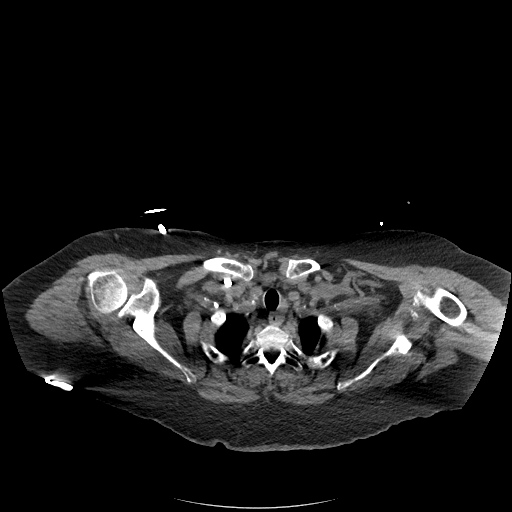

[Series 7: cap w/o 3.0 mm st cor · coronal · non-contrast · 0.83mm/px · 3 of 117 slices shown]
[im 39/117  soft-tissue]
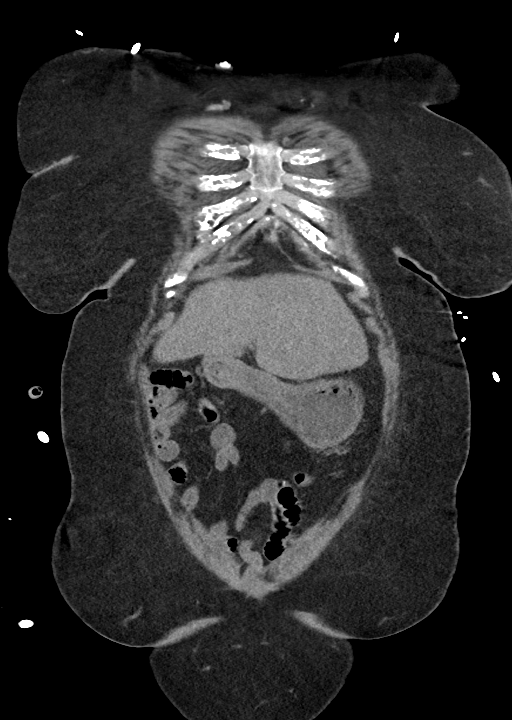
[im 52/117  soft-tissue]
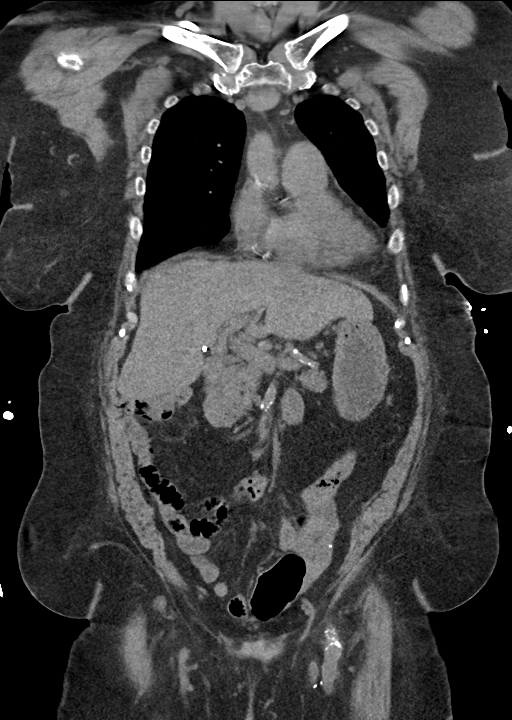
[im 65/117  soft-tissue]
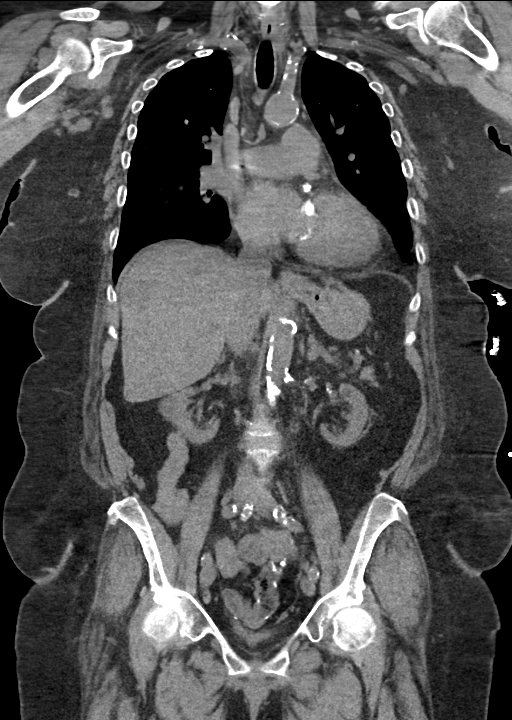

[14 of 46 positions shown; findings below may reference images not displayed]

FINDINGS: CT CHEST FINDINGS

Cardiovascular: Normal heart size. No significant pericardial
effusion. The thoracic aorta is normal in caliber. At least mild
atherosclerotic plaque of the thoracic aorta. Four-vessel coronary
artery calcifications. Mitral annular calcifications. The main
pulmonary artery enlarged in caliber.

Mediastinum/Nodes: No gross hilar adenopathy, noting limited
sensitivity for the detection of hilar adenopathy on this
noncontrast study. No enlarged mediastinal or axillary lymph nodes.
Thyroid gland, trachea, and esophagus demonstrate no significant
findings. Possibly tiny hiatal hernia.

Lungs/Pleura: Bilateral lower lobe subsegmental atelectasis.
Slightly more conspicuous but grossly stable in size triangular
subpleural 6 mm pulmonary nodule within the right upper lobe likely
representing an intrapulmonary lymph node ([DATE]). Stable 5 mm left
lower lobe pulmonary nodule ([DATE]). No new pulmonary nodule. No
pulmonary mass. No focal consolidation.

Musculoskeletal:

No chest wall abnormality.

No suspicious lytic or blastic osseous lesions. No acute displaced
fracture. Multilevel degenerative changes of the spine.

CT ABDOMEN PELVIS FINDINGS

Hepatobiliary: No focal liver abnormality is seen. Status post
cholecystectomy. No biliary dilatation.

Pancreas: No focal lesion. Normal pancreatic contour. No surrounding
inflammatory changes. No main pancreatic ductal dilatation.

Spleen: Normal in size without focal abnormality. Several splenules
noted within the left upper lobe with the largest 1 measuring up to
4 cm.

Adrenals/Urinary Tract:

No adrenal nodule bilaterally.

Bilateral renal cortical scarring. No nephrolithiasis, no
hydronephrosis. There is a 2.3 cm left renal fluid density lesion
that likely represents a simple renal cyst. Slight interval increase
in size of two low-density slightly heterogeneous right renal
lesions measuring 2.7 and 2.3 cm demonstrating indeterminate
densities of 22 and 26 Hounsfield units. Subcentimeter hypodensities
are too small to characterize. Otherwise no contour-deforming renal
mass. No ureterolithiasis or hydroureter.

The urinary bladder is decompressed and grossly unremarkable.

Stomach/Bowel: Partial colectomy stomach is within normal limits. No
evidence of bowel wall thickening or dilatation.

Vascular/Lymphatic: No abdominal aorta or iliac aneurysm. Severe
atherosclerotic plaque of the aorta and its branches. No abdominal,
pelvic, or inguinal lymphadenopathy.

Reproductive: Status post hysterectomy. No adnexal masses.

Other: No intraperitoneal free fluid. No intraperitoneal free gas.
No organized fluid collection.

Musculoskeletal:

Small fat containing umbilical hernia. Healed anterior abdominal
incision.

No suspicious lytic or blastic osseous lesions. No acute displaced
fracture. Multilevel degenerative changes of the spine.
IMPRESSION: 1. Slightly more conspicuous but grossly stable in size 6 mm
triangular right upper lobe subpleural nodule. Stable 5 mm left
lower lobe pulmonary nodule. No new pulmonary nodule or mass. Given
stability, recommend CT at 12-[J1] is considered optional for
low-risk patients, but is recommended for high-risk patients. This
recommendation follows the consensus statement: Guidelines for
Management of Incidental Pulmonary Nodules Detected on CT Images:
2. Enlarged main pulmonary artery suggestive of pulmonary
hypertension.
3. Interval increase in size of two indeterminate slightly
heterogeneous 2.3cm and 2.7 cm right renal lesions. Recommend MRI
renal protocol for further evaluation.
4. Aortic Atherosclerosis ([J1]-[J1]) including four-vessel
coronary artery calcifications and mitral annular calcifications.
5. Please note limited evaluation due to noncontrast study.

## 2021-07-16 IMAGING — DX DG CHEST 1V PORT
1 series · 1 of 1 positions shown · non-contrast
Comparison: [DATE]

CLINICAL DATA: Questionable sepsis

EXAM:
PORTABLE CHEST 1 VIEW

[chest]
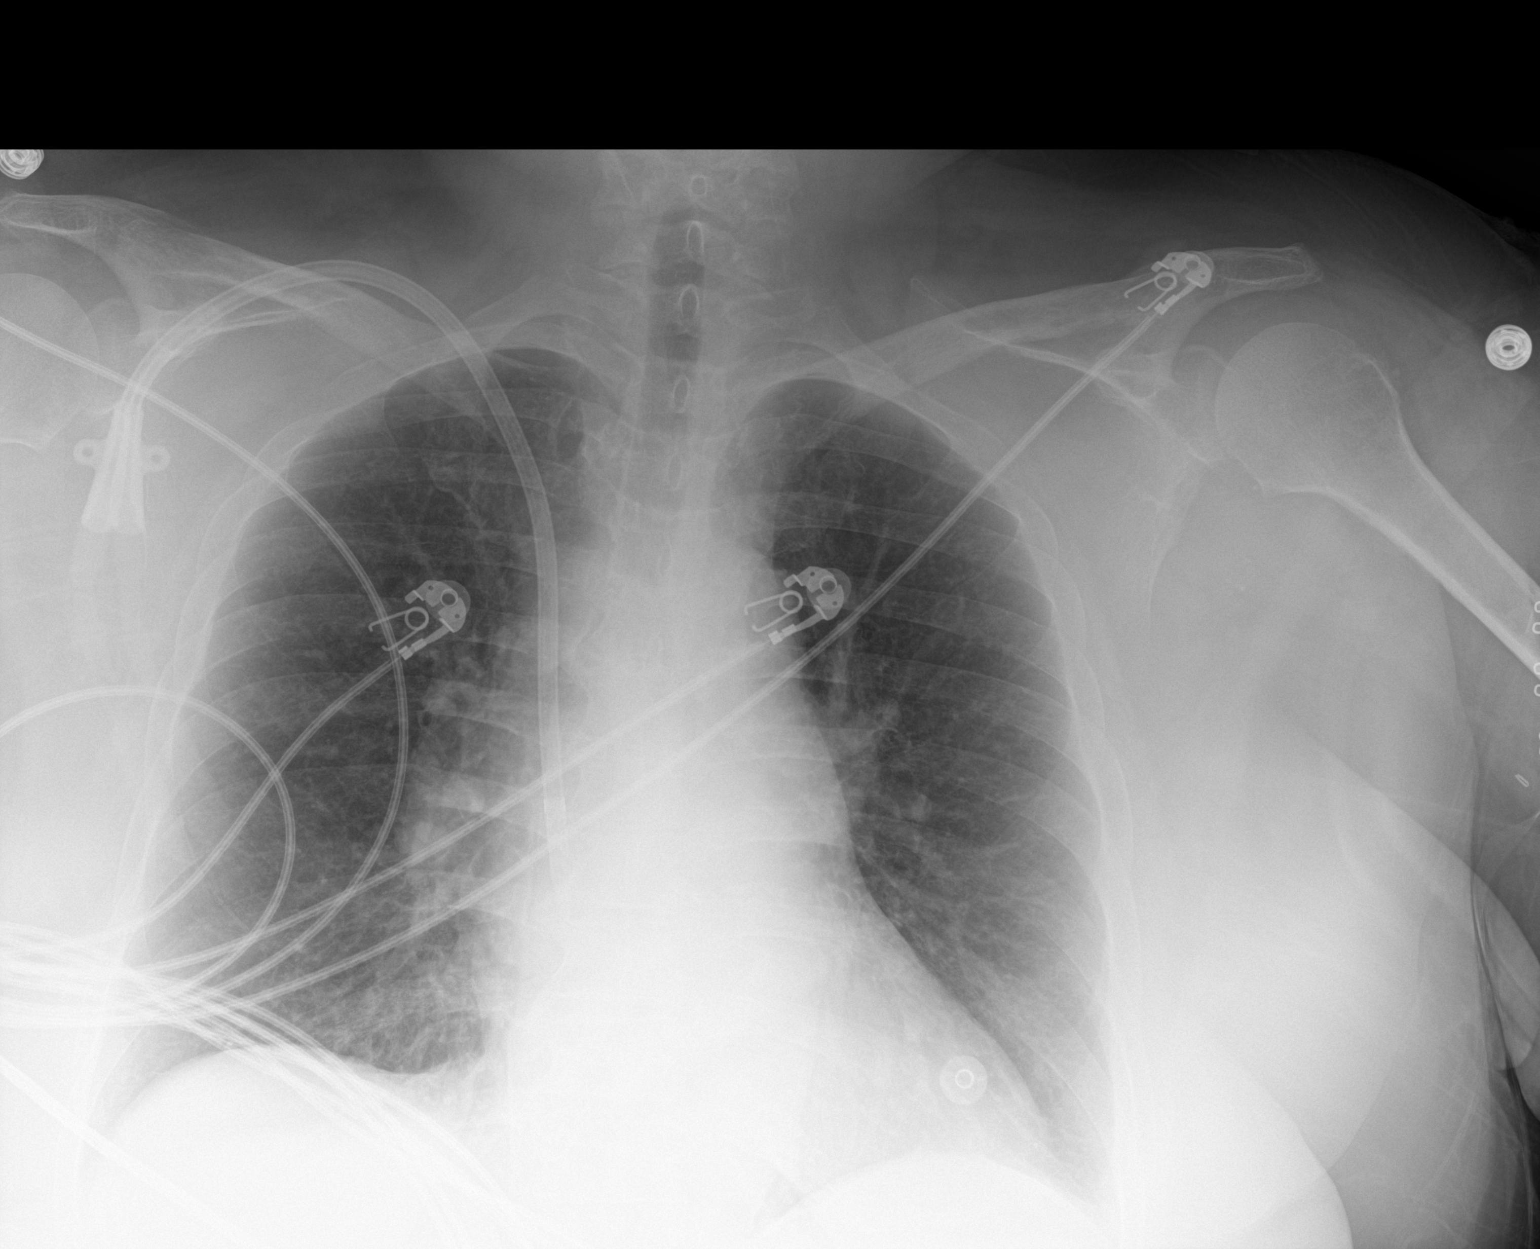

[1 of 1 positions shown; findings below may reference images not displayed]

FINDINGS: Right internal jugular dual lumen tunneled central venous catheter
in stable position. The heart size and mediastinal contours are
within normal limits. Streaky bibasilar opacities. No visible
pleural effusion or pneumothorax. The visualized skeletal structures
are unremarkable.
IMPRESSION: Streaky bibasilar opacities, favor atelectasis although developing
infection would not be excluded.

## 2021-07-16 MED ORDER — METRONIDAZOLE 500 MG/100ML IV SOLN
500.0000 mg | Freq: Once | INTRAVENOUS | Status: AC
  Administered 2021-07-16: 500 mg via INTRAVENOUS
  Filled 2021-07-16: qty 100

## 2021-07-16 MED ORDER — VANCOMYCIN HCL IN DEXTROSE 1-5 GM/200ML-% IV SOLN: 1000.0000 mg | INTRAVENOUS | Status: DC

## 2021-07-16 MED ORDER — SODIUM CHLORIDE 0.9 % IV BOLUS (SEPSIS)
1000.0000 mL | Freq: Once | INTRAVENOUS | Status: AC
Start: 2021-07-16 — End: 2021-07-16
  Administered 2021-07-16: 1000 mL via INTRAVENOUS

## 2021-07-16 MED ORDER — NOREPINEPHRINE 4 MG/250ML-% IV SOLN
0.0000 ug/min | INTRAVENOUS | Status: DC
  Administered 2021-07-16: 2 ug/min via INTRAVENOUS
  Filled 2021-07-16: qty 250

## 2021-07-16 MED ORDER — SODIUM CHLORIDE 0.9 % IV BOLUS (SEPSIS)
1000.0000 mL | Freq: Once | INTRAVENOUS | Status: AC
  Administered 2021-07-16: 1000 mL via INTRAVENOUS

## 2021-07-16 MED ORDER — DOCUSATE SODIUM 100 MG PO CAPS: 100.0000 mg | ORAL_CAPSULE | Freq: Two times a day (BID) | ORAL | Status: DC | PRN

## 2021-07-16 MED ORDER — HEPARIN SODIUM (PORCINE) 5000 UNIT/ML IJ SOLN: 5000.0000 [IU] | Freq: Three times a day (TID) | INTRAMUSCULAR | Status: DC

## 2021-07-16 MED ORDER — NOREPINEPHRINE 4 MG/250ML-% IV SOLN
2.0000 ug/min | INTRAVENOUS | Status: DC
  Administered 2021-07-16: 6 ug/min via INTRAVENOUS
  Administered 2021-07-17: 10 ug/min via INTRAVENOUS
  Filled 2021-07-16: qty 250

## 2021-07-16 MED ORDER — FAMOTIDINE IN NACL 20-0.9 MG/50ML-% IV SOLN
20.0000 mg | INTRAVENOUS | Status: DC
  Administered 2021-07-16 – 2021-07-17 (×2): 20 mg via INTRAVENOUS
  Filled 2021-07-16 (×2): qty 50

## 2021-07-16 MED ORDER — POLYETHYLENE GLYCOL 3350 17 G PO PACK: 17.0000 g | PACK | Freq: Every day | ORAL | Status: DC | PRN

## 2021-07-16 MED ORDER — LACTATED RINGERS IV BOLUS
1000.0000 mL | Freq: Once | INTRAVENOUS | Status: AC
  Administered 2021-07-16: 1000 mL via INTRAVENOUS

## 2021-07-16 MED ORDER — FENTANYL CITRATE (PF) 100 MCG/2ML IJ SOLN
25.0000 ug | INTRAMUSCULAR | Status: DC | PRN
  Administered 2021-07-16: 50 ug via INTRAVENOUS
  Administered 2021-07-17 (×2): 25 ug via INTRAVENOUS
  Filled 2021-07-16 (×4): qty 2

## 2021-07-16 MED ORDER — SODIUM CHLORIDE 0.9 % IV SOLN
INTRAVENOUS | Status: AC
Start: 2021-07-16 — End: 2021-07-17

## 2021-07-16 MED ORDER — HEPARIN (PORCINE) 25000 UT/250ML-% IV SOLN
1700.0000 [IU]/h | INTRAVENOUS | Status: DC
  Administered 2021-07-16: 1000 [IU]/h via INTRAVENOUS
  Administered 2021-07-17: 1400 [IU]/h via INTRAVENOUS
  Administered 2021-07-19 (×2): 1600 [IU]/h via INTRAVENOUS
  Administered 2021-07-20: 1750 [IU]/h via INTRAVENOUS
  Administered 2021-07-21: 1700 [IU]/h via INTRAVENOUS
  Filled 2021-07-16 (×8): qty 250

## 2021-07-16 MED ORDER — ONDANSETRON HCL 4 MG/2ML IJ SOLN
4.0000 mg | Freq: Once | INTRAMUSCULAR | Status: AC
  Administered 2021-07-16: 4 mg via INTRAVENOUS
  Filled 2021-07-16: qty 2

## 2021-07-16 MED ORDER — LACTATED RINGERS IV SOLN: INTRAVENOUS | Status: DC

## 2021-07-16 MED ORDER — SODIUM CHLORIDE 0.9 % IV SOLN
2.0000 g | Freq: Once | INTRAVENOUS | Status: AC
  Administered 2021-07-16: 2 g via INTRAVENOUS
  Filled 2021-07-16: qty 2

## 2021-07-16 MED ORDER — SODIUM CHLORIDE 0.9 % IV SOLN
250.0000 mL | INTRAVENOUS | Status: DC
  Administered 2021-07-17 – 2021-07-19 (×2): 250 mL via INTRAVENOUS

## 2021-07-16 MED ORDER — NITROGLYCERIN 0.4 MG SL SUBL
SUBLINGUAL_TABLET | SUBLINGUAL | Status: AC
  Filled 2021-07-16: qty 1

## 2021-07-16 MED ORDER — SODIUM CHLORIDE 0.9 % IV SOLN
2.0000 g | INTRAVENOUS | Status: DC
  Filled 2021-07-16: qty 2

## 2021-07-16 MED ORDER — INSULIN ASPART 100 UNIT/ML IJ SOLN: 0.0000 [IU] | INTRAMUSCULAR | Status: DC

## 2021-07-16 MED ORDER — HYDROMORPHONE HCL 1 MG/ML IJ SOLN: 0.5000 mg | INTRAMUSCULAR | Status: DC | PRN

## 2021-07-16 MED ORDER — VANCOMYCIN HCL 2000 MG/400ML IV SOLN
2000.0000 mg | Freq: Once | INTRAVENOUS | Status: AC
  Administered 2021-07-16: 2000 mg via INTRAVENOUS
  Filled 2021-07-16: qty 400

## 2021-07-16 MED ORDER — SODIUM CHLORIDE 0.9 % IV SOLN
1.0000 g | INTRAVENOUS | Status: DC
  Filled 2021-07-16: qty 1

## 2021-07-16 MED ORDER — ACETAMINOPHEN 325 MG PO TABS
650.0000 mg | ORAL_TABLET | Freq: Once | ORAL | Status: AC
Start: 2021-07-16 — End: 2021-07-16
  Administered 2021-07-16: 650 mg via ORAL
  Filled 2021-07-16: qty 2

## 2021-07-16 NOTE — ED Notes (Signed)
Pt has had a cough and been vomiting since yesterday

## 2021-07-16 NOTE — Progress Notes (Signed)
Due to pt being a difficult stick, blood cultures were drawn after antibiotics hung

## 2021-07-16 NOTE — ED Notes (Signed)
This RN walking with pt to CT

## 2021-07-16 NOTE — Progress Notes (Signed)
ANTICOAGULATION CONSULT NOTE - Initial Consult  Pharmacy Consult for Heparin Indication: Hx of DVT  Allergies  Allergen Reactions   Carnosine     Other reaction(s): Unknown   Gadolinium Derivatives Hives and Other (See Comments)    HIVES, Desc: HIVES W/ "DYE" USED FOR 1ST CT SCAN BUT NOT 2ND, NO PREMEDS USED, PT UNCERTAIN OF CIRCUMSTANCES,,?POSSIBLE MRI CONTRAST ALLERGY, ALL STUDIES DONE "SOMEWHERE" IN [place]//A.C., Onset Date: [id number]   Iohexol Other (See Comments)     Code: HIVES, Desc: HIVES W/ "DYE" USED FOR 1ST CT SCAN BUT NOT 2ND, NO PREMEDS USED, PT UNCERTAIN OF CIRCUMSTANCES,,?POSSIBLE MRI CONTRAST ALLERGY, ALL STUDIES DONE "SOMEWHERE" IN [place]//A.C., Onset Date: [id number]    Iodine Hives   Naltrexone     Other reaction(s): Unknown    Patient Measurements: Height: 5\' 1"  (154.9 cm) Weight: 90.7 kg (200 lb) IBW/kg (Calculated) : 47.8 Heparin Dosing Weight: 69 kg   Vital Signs: Temp: 99.7 F (37.6 C) (07/18 1613) Temp Source: Rectal (07/18 1613) BP: 116/66 (07/18 1830) Pulse Rate: 100 (07/18 1830)  Labs: Recent Labs    07/16/21 1402  HGB 11.9*  HCT 37.8  PLT 143*  APTT 24  LABPROT 16.5*  INR 1.3*  CREATININE 7.31*    Estimated Creatinine Clearance: 7.8 mL/min (A) (by C-G formula based on SCr of 7.31 mg/dL (H)).   Medical History: Past Medical History:  Diagnosis Date   Anemia    Anxiety    CHF (congestive heart failure) ([place])    Colon cancer ([place])    treatment surgery   Complication of anesthesia    after first C- [place] "couldnt walk after", patient denies having a spinal   COPD (chronic obstructive pulmonary disease) ([place])    Coronary artery disease    Depression    DVT (deep venous thrombosis) ([facility])    ESRD (end stage renal disease) ([place])    Hemo: MWF   History of blood transfusion 04/2018   Hypertension    07/07/18- no longer takes BP medications   Meningitis    Pain in limb 07/30/2013   PE (pulmonary embolism)    Peripheral  vascular disease ([facility])    Restless legs    Shortness of breath    with exertion   Sleep apnea    SOB (shortness of breath) 03/03/2019   Vertigo     Medications:  Eliquis 2.5 mg BID PTA  Assessment: 89 YOF presents to ED critically ill due to septic shock. Patient has had hx of VTE and is currently on Eliquis 2.5 mg PO BID. Last dose unknown. Suspect last dose in AM  Hg 11.9; Hct 37.8, PLT 143  Goal of Therapy:  Heparin level 0.3-0.7 units/ml aPTT 66-102 seconds Monitor platelets by anticoagulation protocol: Yes   Plan:  Start heparin IV gtt 1000 units/hr starting at 07/16/21 2100 Check 8 hour aPTT/HL level.  Monitor CBC, clinical progression, s/sx bleeding/clots, and LOT.  Thank you for allowing Pharmacy to take part in this patient's care.  Marlowe Alt, PharmD Candidate 07/16/2021 7:10 PM

## 2021-07-16 NOTE — Progress Notes (Signed)
Notified bedside nurse of need to draw repeat lactic acid. 

## 2021-07-16 NOTE — ED Notes (Signed)
This RN attempted to call report to [id number].

## 2021-07-16 NOTE — ED Provider Notes (Signed)
[facility] EMERGENCY DEPARTMENT Provider Note   CSN: [id number] Arrival date & time: 07/16/21  1118     History Chief Complaint  Patient presents with   Flank Pain    Whitney Walker is a 65 y.o. female with PMHx DVT/PE on Eliquis, ESRD on Dialysis MWF, CHF, COPD who presents to the ED today with complaint of gradual onset, constant, achy, diffuse lower back pain for the past several weeks. Pt reports that over the past few days she began having nausea, NBNB emesis, diarrhea, and fevers with tmax 100.0. Pt went to dialysis today and was able to complete her treatment however began having worsening pain prompting EMS call. Pt still does produce some urine - last urinated about 3 days ago however denies any burning, frequency, urgency. She also mentions having a cough over the past few days; denies recent sick contacts. Pt has been taking Tylenol for her pain without relief. Last took Tylenol sometime last night.   The history is provided by the patient and medical records.      Past Medical History:  Diagnosis Date   Anemia    Anxiety    CHF (congestive heart failure) ([place])    Colon cancer ([place])    treatment surgery   Complication of anesthesia    after first C- [place] "couldnt walk after", patient denies having a spinal   COPD (chronic obstructive pulmonary disease) ([place])    Coronary artery disease    Depression    DVT (deep venous thrombosis) (H[facility])    ESRD (end stage renal disease) ([place])    Hemo: MWF   History of blood transfusion 04/2018   Hypertension    07/07/18- no longer takes BP medications   Meningitis    Pain in limb 07/30/2013   PE (pulmonary embolism)    Peripheral vascular disease (H[facility])    Restless legs    Shortness of breath    with exertion   Sleep apnea    SOB (shortness of breath) 03/03/2019   Vertigo     Patient Active Problem List   Diagnosis Date Noted   FUO (fever of unknown origin)    Polyarthritis    Polyarthralgia     Hyperkalemia, diminished renal excretion 05/28/2021   Generalized weakness 05/28/2021   Rash and nonspecific skin eruption 05/28/2021   Atypical chest pain    Coronary artery calcification seen on CAT scan    Hyperkalemia 05/09/2020   Left leg pain 05/09/2020   Restless leg syndrome 09/19/2018   OSA (obstructive sleep apnea) 04/29/2018   ESRD on dialysis ([place]) 04/29/2018   Dyspnea on exertion 03/12/2017   Morbid (severe) obesity due to excess calories ([place]) 03/12/2017   Angina pectoris ([place]) 03/02/2017   Anemia of chronic kidney failure, stage 3 (moderate) ([place]) 12/10/2016   History of colon cancer 08/12/2016   Mass of left breast on mammogram 12/10/2015   Depression 10/18/2015   Numbness and tingling of left arm and leg 01/24/2015   PVD (peripheral vascular disease) with claudication ([place]) 03/23/2014   PAD (peripheral artery disease) ([place]) 12/14/2013   Chronic diastolic heart failure ([place]) 11/15/2013   Essential hypertension 11/15/2013   Leucocytosis 04/28/2012    Past Surgical History:  Procedure Laterality Date   ABDOMINAL AORTAGRAM N/A 07/26/2013   Procedure: ABDOMINAL Maxcine Ham;  Surgeon: [name] Veneta, MD;  Location: [facility] CATH LAB;  Service: Cardiovascular;  Laterality: N/A;   ABDOMINAL HYSTERECTOMY     AV FISTULA PLACEMENT Left 05/05/2018   Procedure:  ARTERIOVENOUS (AV) FISTULA CREATION BRACHIOCEPHALIC;  Surgeon: Waynetta Sandy, MD;  Location: [facility];  Service: Vascular;  Laterality: Left;   AV FISTULA PLACEMENT Left 07/16/2018   Procedure: ARTERIOVENOUS FISTULA CREATION;  Surgeon: Waynetta Sandy, MD;  Location: [place];  Service: Vascular;  Laterality: Left;   [place] Left 09/03/2018   Procedure: BASILIC VEIN TRANSPOSITION SECOND STAGE;  Surgeon: Waynetta Sandy, MD;  Location: [facility];  Service: Vascular;  Laterality: Left;   BREAST BIOPSY Left    CESAREAN SECTION     X 3 1974-1977   CHOLECYSTECTOMY     CHOLECYSTECTOMY  1980/s    COLECTOMY  2010   DIVERTICULOSIS SURGERY-2002  2012   FEMORAL-POPLITEAL BYPASS GRAFT Left 08/13/2013   Procedure: BYPASS GRAFT FEMORAL-POPLITEAL ARTERY WITH NON-REVERSED SAPHANEOUS VEIN; ULTRASOUND GUIDED;  Surgeon: Mal [name], MD;  Location: [facility];  Service: Vascular;  Laterality: Left;   INSERTION OF DIALYSIS CATHETER Right 04/30/2018   Procedure: INSERTION OF Right Internal Jugular DIALYSIS CATHETER;  Surgeon: [name], MD;  Location: [facility] OR;  Service: Vascular;  Laterality: Right;   IR FLUORO GUIDE CV LINE RIGHT  07/24/2020   IR REMOVAL TUN CV CATH W/O FL  07/21/2020   IR US GUIDE VASC ACCESS RIGHT  07/24/2020   LEFT HEART CATH AND CORONARY ANGIOGRAPHY N/A 05/01/2021   Procedure: LEFT HEART CATH AND CORONARY ANGIOGRAPHY;  Surgeon: [name], MD;  Location: [facility] CV LAB;  Service: Cardiovascular;  Laterality: N/A;   LOWER EXTREMITY ANGIOGRAM Left 07/26/2013   Procedure: LOWER EXTREMITY ANGIOGRAM;  Surgeon: [name] Winterset, MD;  Location: [facility] CATH LAB;  Service: Cardiovascular;  Laterality: Left;     OB History   No obstetric history on file.     Family History  Problem Relation Age of Onset   Cancer Mother 75       breast and bone   Cancer Father 22       prostate   Hypertension Sister    Bleeding Disorder Sister    Cancer Cousin 67       breast cancer    Hypertension Daughter    Breast cancer Neg Hx     Social History   Tobacco Use   Smoking status: Every Day    Packs/day: 0.50    Years: 50.00    Pack years: 25.00    Types: Cigarettes    Last attempt to quit: 02/03/2021    Years since quitting: 0.4   Smokeless tobacco: Former  Scientific laboratory technician Use: Never used  Substance Use Topics   Alcohol use: No    Alcohol/week: 0.0 standard drinks    Comment: she used to drink alcohol, quit in 2010; h/o heavy use    Drug use: Yes    Types: Oxycodone    Home Medications Prior to Admission medications   Medication Sig Start Date End Date Taking? Authorizing  Provider  acetaminophen (TYLENOL) 500 MG tablet Take 1,000 mg by mouth 2 (two) times daily as needed for moderate pain or headache.    [provider]  albuterol (VENTOLIN HFA) 108 (90 Base) MCG/ACT inhaler Inhale 2 puffs into the lungs every 6 (six) hours as needed for wheezing or shortness of breath. 08/03/20   [name], FNP  amLODipine (NORVASC) 5 MG tablet Take 1 tablet (5 mg total) by mouth daily. 06/07/21 06/07/22  [name], FNP  apixaban (ELIQUIS) 2.5 MG TABS tablet Take 1 tablet (2.5 mg total) by mouth  2 (two) times daily. 06/13/21   Truitt Merle, MD  Ascorbic Acid (VITAMIN C) 100 MG tablet Take 100 mg by mouth daily.    [provider]  atorvastatin (LIPITOR) 10 MG tablet Take 1 tablet (10 mg total) by mouth daily. 06/05/21   Cantwell, Celeste C, PA-C  AURYXIA 1 GM 210 MG(Fe) tablet Take 420-840 mg by mouth with breakfast, with lunch, and with evening meal. Taking  2 tablets daily (420mg ) with each snack 05/15/21   [provider]  Biotin 10000 MCG TABS Take 10,000 mcg by mouth daily.     [provider]  carvedilol (COREG) 12.5 MG tablet Take 12.5 mg by mouth 2 (two) times daily with a meal.     [provider]  lanthanum (FOSRENOL) 1000 MG chewable tablet Chew 1,000 mg by mouth See admin instructions. Take 1000 mg with each meal and each snack    [provider]  meclizine (ANTIVERT) 12.5 MG tablet Take 1 tablet (12.5 mg total) by mouth 3 (three) times daily as needed for dizziness. 04/12/21   Glendale Chard, MD  multivitamin (RENA-VIT) TABS tablet Take 1 tablet by mouth at bedtime.    Edrick Oh, MD  mupirocin ointment (BACTROBAN) 2 % Apply 1 application topically daily as needed (skin bumps). 04/15/19   [name], FNP  nystatin (NYSTATIN) powder Apply 1 application topically 4 (four) times daily as needed. Patient taking differently: Apply 1 application topically 4 (four) times daily as needed (irriation). 08/03/20   [name], FNP   omeprazole (PRILOSEC) 20 MG capsule Take 1 capsule (20 mg total) by mouth daily. 05/17/21   Truitt Merle, MD  predniSONE (DELTASONE) 10 MG tablet Take 4 tablets (40 mg) daily for 6 days, then, Take 3 tablets (30 mg) daily for 6 days, then, Take 2 tablets (20 mg) daily for 6 days, then, Take 1 tablets (10 mg) daily for 2 days, then stop 05/31/21   Jonetta Osgood, MD  vitamin E 200 UNIT capsule Take 200 Units by mouth daily.    [provider]    Allergies    Carnosine, Gadolinium derivatives, Iohexol, Iodine, and Naltrexone  Review of Systems   Review of Systems  Constitutional:  Positive for fatigue and fever.  Respiratory:  Negative for cough.   Gastrointestinal:  Positive for diarrhea, nausea and vomiting.  Genitourinary:  Negative for dysuria.  Musculoskeletal:  Positive for back pain.  All other systems reviewed and are negative.  Physical Exam Updated Vital Signs BP 93/65   Pulse 98   Temp 99.7 F (37.6 C) (Rectal)   Resp 16   Ht 5\' 1"  (1.549 m)   Wt 90.7 kg   SpO2 91%   BMI 37.79 kg/m   Physical Exam Vitals and nursing note reviewed.  Constitutional:      Appearance: She is ill-appearing. She is not diaphoretic.  HENT:     Head: Normocephalic and atraumatic.  Eyes:     Conjunctiva/sclera: Conjunctivae normal.  Cardiovascular:     Rate and Rhythm: Regular rhythm. Tachycardia present.  Pulmonary:     Effort: Pulmonary effort is normal.     Breath sounds: Normal breath sounds. No wheezing, rhonchi or rales.     Comments: Actively coughing. Able to speak in full sentences without difficulty.  Abdominal:     Palpations: Abdomen is soft.     Tenderness: There is no abdominal tenderness. There is no right CVA tenderness, left CVA tenderness, guarding or rebound.  Musculoskeletal:  Cervical back: Neck supple.     Comments: No midline L TTP. + diffuse bilateral paralumbar musculature TTP with extension into the buttocks bilaterally. No abscess appreciated.    Skin:    General: Skin is warm and dry.  Neurological:     Mental Status: She is alert.    ED Results / Procedures / Treatments   Labs (all labs ordered are listed, but only abnormal results are displayed) Labs Reviewed  LACTIC ACID, PLASMA - Abnormal; Notable for the following components:      Result Value   Lactic Acid, Venous 3.3 (*)    All other components within normal limits  COMPREHENSIVE METABOLIC PANEL - Abnormal; Notable for the following components:   Sodium 132 (*)    Chloride 94 (*)    CO2 19 (*)    BUN 24 (*)    Creatinine, Ser 7.31 (*)    Calcium 8.2 (*)    Albumin 2.7 (*)    GFR, Estimated 6 (*)    Anion gap 19 (*)    All other components within normal limits  CBC WITH DIFFERENTIAL/PLATELET - Abnormal; Notable for the following components:   WBC 26.9 (*)    Hemoglobin 11.9 (*)    RDW 16.4 (*)    Platelets 143 (*)    Neutro Abs 23.8 (*)    Monocytes Absolute 1.6 (*)    Abs Immature Granulocytes 0.30 (*)    All other components within normal limits  PROTIME-INR - Abnormal; Notable for the following components:   Prothrombin Time 16.5 (*)    INR 1.3 (*)    All other components within normal limits  RESP PANEL BY RT-PCR (FLU A&B, COVID) ARPGX2  C DIFFICILE QUICK SCREEN W PCR REFLEX    CULTURE, BLOOD (ROUTINE X 2)  CULTURE, BLOOD (ROUTINE X 2)  URINE CULTURE  GASTROINTESTINAL PANEL BY PCR, STOOL (REPLACES STOOL CULTURE)  APTT  LACTIC ACID, PLASMA  URINALYSIS, ROUTINE W REFLEX MICROSCOPIC    EKG EKG Interpretation  Date/Time:  Monday July 16 2021 11:36:17 EDT Ventricular Rate:  122 PR Interval:  156 QRS Duration: 77 QT Interval:  327 QTC Calculation: 466 R Axis:   -35 Text Interpretation: Sinus tachycardia Left axis deviation Nonspecific T abnrm, anterolateral leads tachycardia but otherwise no sig change from previous Confirmed by Charlesetta Shanks [phone]) on 07/16/2021 3:55:29 PM  Radiology DG Chest Port 1 View  Result Date:  07/16/2021 CLINICAL DATA:  Questionable sepsis EXAM: PORTABLE CHEST 1 VIEW COMPARISON:  May 28, 2021 FINDINGS: Right internal jugular dual lumen tunneled central venous catheter in stable position. The heart size and mediastinal contours are within normal limits. Streaky bibasilar opacities. No visible pleural effusion or pneumothorax. The visualized skeletal structures are unremarkable. IMPRESSION: Streaky bibasilar opacities, favor atelectasis although developing infection would not be excluded. Electronically Signed   By: Dahlia Bailiff MD   On: 07/16/2021 13:59   CT CHEST ABDOMEN PELVIS WO CONTRAST  Result Date: 07/16/2021 CLINICAL DATA:  Respiratory illness, nondiagnostic xray. Respiratory illness/flank pain EXAM: CT CHEST, ABDOMEN AND PELVIS WITHOUT CONTRAST TECHNIQUE: Multidetector CT imaging of the chest, abdomen and pelvis was performed following the standard protocol without IV contrast. COMPARISON:  CT chest 02/06/2021, CT cardiac 02/27/2021, CT abdomen pelvis 05/18/2012 FINDINGS: CT CHEST FINDINGS Cardiovascular: Normal heart size. No significant pericardial effusion. The thoracic aorta is normal in caliber. At least mild atherosclerotic plaque of the thoracic aorta. Four-vessel coronary artery calcifications. Mitral annular calcifications. The main pulmonary artery enlarged in caliber. Mediastinum/Nodes:  No gross hilar adenopathy, noting limited sensitivity for the detection of hilar adenopathy on this noncontrast study. No enlarged mediastinal or axillary lymph nodes. Thyroid gland, trachea, and esophagus demonstrate no significant findings. Possibly tiny hiatal hernia. Lungs/Pleura: Bilateral lower lobe subsegmental atelectasis. Slightly more conspicuous but grossly stable in size triangular subpleural 6 mm pulmonary nodule within the right upper lobe likely representing an intrapulmonary lymph node (4:59). Stable 5 mm left lower lobe pulmonary nodule (4:107). No new pulmonary nodule. No pulmonary  mass. No focal consolidation. Musculoskeletal: No chest wall abnormality. No suspicious lytic or blastic osseous lesions. No acute displaced fracture. Multilevel degenerative changes of the spine. CT ABDOMEN PELVIS FINDINGS Hepatobiliary: No focal liver abnormality is seen. Status post cholecystectomy. No biliary dilatation. Pancreas: No focal lesion. Normal pancreatic contour. No surrounding inflammatory changes. No main pancreatic ductal dilatation. Spleen: Normal in size without focal abnormality. Several splenules noted within the left upper lobe with the largest 1 measuring up to 4 cm. Adrenals/Urinary Tract: No adrenal nodule bilaterally. Bilateral renal cortical scarring. No nephrolithiasis, no hydronephrosis. There is a 2.3 cm left renal fluid density lesion that likely represents a simple renal cyst. Slight interval increase in size of two low-density slightly heterogeneous right renal lesions measuring 2.7 and 2.3 cm demonstrating indeterminate densities of 22 and 26 Hounsfield units. Subcentimeter hypodensities are too small to characterize. Otherwise no contour-deforming renal mass. No ureterolithiasis or hydroureter. The urinary bladder is decompressed and grossly unremarkable. Stomach/Bowel: Partial colectomy stomach is within normal limits. No evidence of bowel wall thickening or dilatation. Vascular/Lymphatic: No abdominal aorta or iliac aneurysm. Severe atherosclerotic plaque of the aorta and its branches. No abdominal, pelvic, or inguinal lymphadenopathy. Reproductive: Status post hysterectomy. No adnexal masses. Other: No intraperitoneal free fluid. No intraperitoneal free gas. No organized fluid collection. Musculoskeletal: Small fat containing umbilical hernia. Healed anterior abdominal incision. No suspicious lytic or blastic osseous lesions. No acute displaced fracture. Multilevel degenerative changes of the spine. IMPRESSION: 1. Slightly more conspicuous but grossly stable in size 6 mm  triangular right upper lobe subpleural nodule. Stable 5 mm left lower lobe pulmonary nodule. No new pulmonary nodule or mass. Given stability, recommend CT at 12-29months is considered optional for low-risk patients, but is recommended for high-risk patients. This recommendation follows the consensus statement: Guidelines for Management of Incidental Pulmonary Nodules Detected on CT Images: From the Fleischner Society 2017; Radiology 2017; 284:228-243. 2. Enlarged main pulmonary artery suggestive of pulmonary hypertension. 3. Interval increase in size of two indeterminate slightly heterogeneous 2.3cm and 2.7 cm right renal lesions. Recommend MRI renal protocol for further evaluation. 4. Aortic Atherosclerosis (ICD10-I70.0) including four-vessel coronary artery calcifications and mitral annular calcifications. 5. Please note limited evaluation due to noncontrast study. Electronically Signed   By: Iven Finn M.D.   On: 07/16/2021 16:50    Procedures .Critical Care  Date/Time: 07/16/2021 5:31 PM Performed by: Eustaquio Maize, PA-C Authorized by: Eustaquio Maize, PA-C   Critical care provider statement:    Critical care time (minutes):  60   Critical care was necessary to treat or prevent imminent or life-threatening deterioration of the following conditions:  Sepsis   Critical care was time spent personally by me on the following activities:  Discussions with consultants, evaluation of patient's response to treatment, examination of patient, ordering and performing treatments and interventions, ordering and review of laboratory studies, ordering and review of radiographic studies, pulse oximetry, re-evaluation of patient's condition, obtaining history from patient or surrogate and review of old charts  Medications Ordered in ED Medications  0.9 %  sodium chloride infusion ( Intravenous New Bag/Given 07/16/21 1341)  vancomycin (VANCOREADY) IVPB 2000 mg/400 mL (2,000 mg Intravenous New Bag/Given  07/16/21 1620)  vancomycin (VANCOCIN) IVPB 1000 mg/200 mL premix (has no administration in time range)  ceFEPIme (MAXIPIME) 2 g in sodium chloride 0.9 % 100 mL IVPB (has no administration in time range)  norepinephrine (LEVOPHED) 4mg  in 256mL premix infusion (2 mcg/min Intravenous New Bag/Given (Non-Interop) 07/16/21 1633)  sodium chloride 0.9 % bolus 1,000 mL (0 mLs Intravenous Stopped 07/16/21 1617)  acetaminophen (TYLENOL) tablet 650 mg (650 mg Oral Given 07/16/21 1341)  ceFEPIme (MAXIPIME) 2 g in sodium chloride 0.9 % 100 mL IVPB (0 g Intravenous Stopped 07/16/21 1446)  metroNIDAZOLE (FLAGYL) IVPB 500 mg (0 mg Intravenous Stopped 07/16/21 1619)  ondansetron (ZOFRAN) injection 4 mg (4 mg Intravenous Given 07/16/21 1358)  sodium chloride 0.9 % bolus 1,000 mL (0 mLs Intravenous Stopped 07/16/21 1633)    And  sodium chloride 0.9 % bolus 1,000 mL (0 mLs Intravenous Stopped 07/16/21 1639)    ED Course  I have reviewed the triage vital signs and the nursing notes.  Pertinent labs & imaging results that were available during my care of the patient were reviewed by me and considered in my medical decision making (see chart for details).  Clinical Course as of 07/16/21 1731  Mon Jul 16, 2021  1441 WBC(!): 26.9 [MV]  1551 Lactic Acid, Venous(!!): 3.3 [MV]    Clinical Course User Index [MV] Eustaquio Maize, PA-C   MDM Rules/Calculators/A&P                          65  year old female presenting to the ED today with complaint of consistent lower back pain for the past several weeks; worsening over the past week with new onset N/V/D and fevers. Went to dialysis today, completed treatment however felt worse and came to the ED for further eval. On arrival to the ED pt is febrile at 100.1 (last took Tylenol last night). She is tachycardic in the 120s, tachypneic. Blood pressure on arrival 100/60 and repeat 113/64. Concern for sepsis at this time and protocol initiated. Pt still produces small amount of urine  - question UTI vs pyelo vs other intraabdominal infection. Will likely need CT scan. Given amount of diarrhea c diff and GI panel also obtained.   CXR: IMPRESSION:  Streaky bibasilar opacities, favor atelectasis although developing  infection would not be excluded.   CBC with leukocytosis 26,900 with left shift. Currently receiving IV abx for unknown source. Given CXR findings will plan for CT Chest, Abdomen, and Pelvis. Discussed case with attending physician Dr. Johnney Killian who agrees.   While in the ED blood pressures have slowly declined with worse at 68/42. Additional IV access obtained by Dr. Johnney Killian via bedside ultrasound; currently receiving 30/cc per fluid bolus and abx however levofed also initiated.   CMP with creatinine 7.31, BUN 24. Glucose 99, bicarb 19 with gap of 19. Likely from dehydration.  Lactic acid 3.3  C diff negative CT: IMPRESSION:  1. Slightly more conspicuous but grossly stable in size 6 mm  triangular right upper lobe subpleural nodule. Stable 5 mm left  lower lobe pulmonary nodule. No new pulmonary nodule or mass. Given  stability, recommend CT at 12-69months is considered optional for  low-risk patients, but is recommended for high-risk patients. This  recommendation follows the consensus statement: Guidelines for  Management of Incidental Pulmonary Nodules Detected on CT Images:  From the Fleischner Society 2017; Radiology 2017; 284:228-243.  2. Enlarged main pulmonary artery suggestive of pulmonary  hypertension.  3. Interval increase in size of two indeterminate slightly  heterogeneous 2.3cm and 2.7 cm right renal lesions. Recommend MRI  renal protocol for further evaluation.  4. Aortic Atherosclerosis (ICD10-I70.0) including four-vessel  coronary artery calcifications and mitral annular calcifications.  5. Please note limited evaluation due to noncontrast study.   No obvious source of infection at this time. Still awaiting urinalysis. Pt does produce  a small amount of urine. Will call for admission at this time. Question if pt will need MRI of L spine with concern for possible discitis ?   Discussed case with PCCM Brandy who agrees to evaluate patient for admission.   This note was prepared using Dragon voice recognition software and may include unintentional dictation errors due to the inherent limitations of voice recognition software.  Final Clinical Impression(s) / ED Diagnoses Final diagnoses:  Sepsis without acute organ dysfunction, due to unspecified organism [facility])    Rx / DC Orders ED Discharge Orders     None        Eustaquio Maize, PA-C 07/16/21 1732    Charlesetta Shanks, MD 07/18/21 [phone]

## 2021-07-16 NOTE — ED Provider Notes (Signed)
I provided a substantive portion of the care of this patient.  I personally performed the entirety of the medical decision making for this encounter.  EKG Interpretation  Date/Time:  Monday July 16 2021 11:36:17 EDT Ventricular Rate:  122 PR Interval:  156 QRS Duration: 77 QT Interval:  327 QTC Calculation: 466 R Axis:   -35 Text Interpretation: Sinus tachycardia Left axis deviation Nonspecific T abnrm, anterolateral leads tachycardia but otherwise no sig change from previous Confirmed by Charlesetta Shanks [phone]) on 07/16/2021 3:55:29 PM   Patient reports weeks of fairly uncomfortable lower back pain.  She indicates her lower back across the top of the iliac crests.  She reports how ever over the past several days it worsened quite a bit.  She reports she was in a lot of pain but now it is even seeming to hurt into her buttocks.  Patient also reports she has been having quite a bit of diarrhea.  She reports several episodes daily very thin.  Patient completed full dialysis treatment today prior to coming into the emergency department.   Patient had temperature 100.1.  She appears very uncomfortable.  Mental Status is clear.  Heart regular borderline tachycardia.  Lungs are clear.  Visual rashes or soft tissue abnormalities on the back or buttocks.  No area suspicious for cellulitis or soft tissue infection on the buttocks or the back.  Abdomen soft but moderate left lateral discomfort to deeper palpation.  No guarding.  Lower extremities symmetric without significant peripheral edema.  Angiocath insertion Performed by: Charlesetta Shanks  Consent: Verbal consent obtained. Risks and benefits: risks, benefits and alternatives were discussed Time out: Immediately prior to procedure a "time out" was called to verify the correct patient, procedure, equipment, support staff and site/side marked as required.  Preparation: Patient was prepped and draped in the usual sterile fashion.  Vein Location: right  median  Yes Ultrasound Guided  Gauge: 20  Normal blood return and flush without difficulty Patient tolerance: Patient tolerated the procedure well with no immediate complications.  Patient presented as outlined.  She began to have decreasing blood pressure with hypotension and low-grade fever.  White count returned at 26,000.  At this time, sepsis protocols initiated.  Fluid resuscitation initiated.  Patient does have pressure that has waxed and waned as low as 60 systolic to 76H.  At this time we will continue fluid resuscitation, antibiotics and initiate Levophed.  [name] mental status remains clear.  Respiratory status remains stable without dyspnea.  Currently sideration's for intra-abdominal source such as diverticulitis, pyelonephritis or bladder infection although patient makes very little urine, possible discitis.  Patient will have to be admitted for sepsis management and continue diagnostic evaluation to identify source.     Charlesetta Shanks, MD 07/16/21 [phone]

## 2021-07-16 NOTE — Progress Notes (Signed)
[facility] Progress Note Patient Name: Whitney Walker DOB: Aug 19, 1956 MRN: [id number]   Date of Service  07/16/2021  HPI/Events of Note  Multiple issues: 1. Pain - 8/10 bilateral flank/hip pain. 2. Hyperglycemia - Blood glucose 138 and Creatinine = 7.31.  3. Patient has NS IV infusion at 150 mL/hour and LR IV infusion at 75 mL/hour. Nursing asking for review and adjustment.   eICU Interventions  Plan" Fentanyl 25-50 mcg IV Q 4 hours PRN pain. Q 4 hours very sensitive Novolog SSI. D/C LR IV infusion.  Decrease 0.9 NaCl IV infusion to 100 mL/hour.     Intervention Category Major Interventions: Hyperglycemia - active titration of insulin therapy;Other:  Lysle Dingwall 07/16/2021, 8:25 PM

## 2021-07-16 NOTE — Progress Notes (Addendum)
[facility] Progress Note Patient Name: Whitney Walker DOB: 1956-06-06 MRN: [id number]   Date of Service  07/16/2021  HPI/Events of Note  Patient c/o chest pain that woke her up from sleep. States that pain is sharp and does not radiate. No N/V or diaphoresis. EKG reveals normal sinus rhythm. Left axis deviation. Prolonged QT. EKG is under whelming for cardiac ischemia. Nurse able to reproduce pain by pushing on chest wall in area patient described the pain which suggests musculoskeletal chest wall pain. However, will r/o MI with Troponin to be complete.   eICU Interventions  Plan: Cycle troponin. ASA 150 mg per rectum now and Q day.  Fentanyl for pain as already ordered.      Intervention Category Major Interventions: Other:  Lysle Dingwall 07/16/2021, 11:41 PM

## 2021-07-16 NOTE — Progress Notes (Signed)
Elink following for code sepsis 

## 2021-07-16 NOTE — H&P (Signed)
NAME:  Whitney Walker, MRN:  [id number], DOB:  12/05/1956, LOS: 0 ADMISSION DATE:  07/16/2021, CONSULTATION DATE: 7/18 REFERRING MD:  pfeiffer, CHIEF COMPLAINT: septic shock  History of Present Illness:  46 yof w/ past medical hx as provided below; presented to the ED 7/18 w/ cc  gradual onset of constant diffuse achy low back pain which had progressed over several weeks, followed by new onset of nausea, vomiting and diarrhea with fever 100. She completed full HD session 7/18 and EMS called afterwards as pt reported worsening pain. Still makes urine though minimal and last time awas bout 3d PTA.  In ER: + fever 99.7 HR initially 90s bp 93/65, somewhat toxic appearing, WBC 26.9, Reporting she was tender to palpation of the bilateral paralumbar region w/ extension of this discomfort to the buttocks.  CT imaging of chest/abd/pelvis: RUL subpleural nodule. LLL pulm nodule. Enlarged Pulm arteries, two slightly heterogenous appearing renal lesions (unclear etiology), no lytic or osseous lesions, no abd pathology. No nephrolithiasis  Got more hypotensive while in ER. Latate 3.3 got 3 liters IVF. Cultures sent. Started on vanc and cefepime. PCCM asked to admit    Pertinent  Medical History  ESRD (MWF), CHF, COPD prior PE/DVT on DOAC. Anemia of chronic disease. Colon cancer. HTN, PVD RLS Sleep apnea   Significant Hospital Events: Including procedures, antibiotic start and stop dates in addition to other pertinent events   7/18 admitted w/ working dx sepsis. Source not clear but seemed UT source.  Got IVFs 3 liters.  CT chest/abd/pelvis w/out clear source. Had two slightly heterogenous appearing renal lesions (unclear etiology. Lactate 3.3. started on Norepi. Cefepime and Vanc started.   Interim History / Subjective:  Remains dry, have asked RN for additional 2L LR bolus now. Has ongoing flank and lower abd pain. Said she has had nausea and vomiting for 2 days now along with about 1 day of diarrhea.   She has been taking fluids but is not sure if she has kept up with her losses or not.  Objective   Blood pressure 108/62, pulse 100, temperature 99.7 F (37.6 C), temperature source Rectal, resp. rate (Abnormal) 21, height 5\' 1"  (1.549 m), weight 90.7 kg, SpO2 93 %.       No intake or output data in the 24 hours ending 07/16/21 1738 Filed Weights   07/16/21 1504 07/16/21 1550  Weight: 90.7 kg 90.7 kg    Examination: General: Adult female, resting in bed, in NAD. Neuro: A&O x 3, no deficits. HEENT: [place]/AT. Sclerae anicteric. EOMI.  MM dry. Cardiovascular: RRR, no M/R/G.  Lungs: Respirations even and unlabored.  CTA bilaterally, No W/R/R.  Abdomen: BS x 4, soft, tender to deep palpation. Musculoskeletal: No gross deformities, no edema.  Skin: Warm, no rashes.   Resolved Hospital Problem list     Assessment & Plan:   Septic Shock - unclear etiology thus far. - Additional 1L LR bolus now then maintenance at 75. - Continue low dose Levophed as needed, goal MAP > 65. - Add abdominal ultrasound to further evaluate for intraabdominal process. - Continue empiric Vanc / Cefepime. - Follow cultures.   ESRD on HD MWF - completed full session Mon 7/18. - Rounding team for 7/19 to please notify Nephrology of admission (will hold off on calling now given that it is after their normal hours and we don't have any immediate HD needs).  Renal lesions on CT. - F/u on abdominal US.   Hx DVT / PE. -  Heparin gtt in lieu of home Eliquis for now incase needs any procedures etc.   Hx HTN, HLD, PVD, CAD (LHC from May 2022 with mod disease RCA and Cx, diffuse coronary calcifications, minimal disease LAD, normal LVEF and LVEDP). - Hold home Amlodipine, Apixaban, Atorvastatin, Carvedilol.   Hx RLS, anxiety. - Supportive care.  RUL and LLL pulmonary nodules - stable. - F/u CT in 12 - 18 months.  Best Practice (right click and "Reselect all SmartList Selections" daily)   Diet/type: NPO DVT  prophylaxis: systemic heparin GI prophylaxis: H2B Lines: N/A Foley:  N/A Code Status:  full code Last date of multidisciplinary goals of care discussion: not yet.  Labs   CBC: Recent Labs  Lab 07/16/21 1402  WBC 26.9*  NEUTROABS 23.8*  HGB 11.9*  HCT 37.8  MCV 92.2  PLT 143*    Basic Metabolic Panel: Recent Labs  Lab 07/16/21 1402  NA 132*  K 4.8  CL 94*  CO2 19*  GLUCOSE 99  BUN 24*  CREATININE 7.31*  CALCIUM 8.2*   GFR: Estimated Creatinine Clearance: 7.8 mL/min (A) (by C-G formula based on SCr of 7.31 mg/dL (H)). Recent Labs  Lab 07/16/21 1402  WBC 26.9*  LATICACIDVEN 3.3*    Liver Function Tests: Recent Labs  Lab 07/16/21 1402  AST 39  ALT 21  ALKPHOS 93  BILITOT 1.2  PROT 7.8  ALBUMIN 2.7*   No results for input(s): LIPASE, AMYLASE in the last 168 hours. No results for input(s): AMMONIA in the last 168 hours.  ABG    Component Value Date/Time   HCO3 25.5 03/03/2019 0335   TCO2 26 03/03/2019 0335   ACIDBASEDEF 14.0 (H) 03/03/2019 0002   O2SAT 39.0 03/03/2019 0335     Coagulation Profile: Recent Labs  Lab 07/16/21 1402  INR 1.3*    Cardiac Enzymes: No results for input(s): CKTOTAL, CKMB, CKMBINDEX, TROPONINI in the last 168 hours.  HbA1C: Hgb A1c MFr Bld  Date/Time Value Ref Range Status  06/04/2018 12:00 AM 5.1 4.0 - 6.0 Final  09/07/2009 04:45 PM (H) 4.6 - 6.1 % Final   6.5 (NOTE) The ADA recommends the following therapeutic goal for glycemic control related to Hgb A1c measurement: Goal of therapy: <6.5 Hgb A1c  Reference: American Diabetes Association: Clinical Practice Recommendations 2010, Diabetes Care, 2010, 33: (Suppl  1).    CBG: No results for input(s): GLUCAP in the last 168 hours.  Review of Systems:   All negative; except for those that are bolded, which indicate positives.  Constitutional: weight loss, weight gain, night sweats, fevers, chills, fatigue, weakness.  HEENT: headaches, sore throat, sneezing,  nasal congestion, post nasal drip, difficulty swallowing, tooth/dental problems, visual complaints, visual changes, ear aches. Neuro: difficulty with speech, weakness, numbness, ataxia. CV:  chest pain, orthopnea, PND, swelling in lower extremities, dizziness, palpitations, syncope.  Resp: cough, hemoptysis, dyspnea, wheezing. GI: heartburn, indigestion, abdominal pain, nausea, vomiting, diarrhea, constipation, change in bowel habits, loss of appetite, hematemesis, melena, hematochezia.  GU: dysuria, change in color of urine, urgency or frequency, flank pain, hematuria. MSK: joint pain or swelling, decreased range of motion. Psych: change in mood or affect, depression, anxiety, suicidal ideations, homicidal ideations. Skin: rash, itching, bruising.   Past Medical History:  She,  has a past medical history of Anemia, Anxiety, CHF (congestive heart failure) ([place]), Colon cancer ([facility]), Complication of anesthesia, COPD (chronic obstructive pulmonary disease) ([place]), Coronary artery disease, Depression, DVT (deep venous thrombosis) ([place]), ESRD (end stage renal disease) ([place]), History  of blood transfusion (04/2018), Hypertension, Meningitis, Pain in limb (07/30/2013), PE (pulmonary embolism), Peripheral vascular disease ([place]), Restless legs, Shortness of breath, Sleep apnea, SOB (shortness of breath) (03/03/2019), and Vertigo.   Surgical History:   Past Surgical History:  Procedure Laterality Date   ABDOMINAL AORTAGRAM N/A 07/26/2013   Procedure: ABDOMINAL AORTAGRAM;  Surgeon: [name] [place], MD;  Location: [facility] CATH LAB;  Service: Cardiovascular;  Laterality: N/A;   ABDOMINAL HYSTERECTOMY     AV FISTULA PLACEMENT Left 05/05/2018   Procedure: ARTERIOVENOUS (AV) FISTULA CREATION BRACHIOCEPHALIC;  Surgeon: Waynetta Sandy, MD;  Location: [facility];  Service: Vascular;  Laterality: Left;   AV FISTULA PLACEMENT Left 07/16/2018   Procedure: ARTERIOVENOUS FISTULA CREATION;  Surgeon: Waynetta Sandy,  MD;  Location: [facility];  Service: Vascular;  Laterality: Left;   [place] Left 09/03/2018   Procedure: BASILIC VEIN TRANSPOSITION SECOND STAGE;  Surgeon: Waynetta Sandy, MD;  Location: [facility];  Service: Vascular;  Laterality: Left;   BREAST BIOPSY Left    CESAREAN SECTION     X 3 1974-1977   CHOLECYSTECTOMY     CHOLECYSTECTOMY  1980/s   COLECTOMY  2010   DIVERTICULOSIS SURGERY-2002  2012   FEMORAL-POPLITEAL BYPASS GRAFT Left 08/13/2013   Procedure: BYPASS GRAFT FEMORAL-POPLITEAL ARTERY WITH NON-REVERSED SAPHANEOUS VEIN; ULTRASOUND GUIDED;  Surgeon: Mal [name], MD;  Location: [facility];  Service: Vascular;  Laterality: Left;   INSERTION OF DIALYSIS CATHETER Right 04/30/2018   Procedure: INSERTION OF Right Internal Jugular DIALYSIS CATHETER;  Surgeon: [name], MD;  Location: [facility] OR;  Service: Vascular;  Laterality: Right;   IR FLUORO GUIDE CV LINE RIGHT  07/24/2020   IR REMOVAL TUN CV CATH W/O FL  07/21/2020   IR US GUIDE VASC ACCESS RIGHT  07/24/2020   LEFT HEART CATH AND CORONARY ANGIOGRAPHY N/A 05/01/2021   Procedure: LEFT HEART CATH AND CORONARY ANGIOGRAPHY;  Surgeon: [name], MD;  Location: [facility] CV LAB;  Service: Cardiovascular;  Laterality: N/A;   LOWER EXTREMITY ANGIOGRAM Left 07/26/2013   Procedure: LOWER EXTREMITY ANGIOGRAM;  Surgeon: [name] , MD;  Location: [facility] CATH LAB;  Service: Cardiovascular;  Laterality: Left;     Social History:   reports that she has been smoking cigarettes. She has a 25.00 pack-year smoking history. She has quit using smokeless tobacco. She reports current drug use. Drug: Oxycodone. She reports that she does not drink alcohol.   Family History:  Her family history includes Bleeding Disorder in her sister; Cancer (age of onset: 17) in her cousin; Cancer (age of onset: 49) in her mother; Cancer (age of onset: 51) in her father; Hypertension in her daughter and sister. There is no history of Breast cancer.    Allergies Allergies  Allergen Reactions   Carnosine     Other reaction(s): Unknown   Gadolinium Derivatives Hives and Other (See Comments)    HIVES, Desc: HIVES W/ "DYE" USED FOR 1ST CT SCAN BUT NOT 2ND, NO PREMEDS USED, PT UNCERTAIN OF CIRCUMSTANCES,,?POSSIBLE MRI CONTRAST ALLERGY, ALL STUDIES DONE "SOMEWHERE" IN [place]//A.C., Onset Date: 10626948   Iohexol Other (See Comments)     Code: HIVES, Desc: HIVES W/ "DYE" USED FOR 1ST CT SCAN BUT NOT 2ND, NO PREMEDS USED, PT UNCERTAIN OF CIRCUMSTANCES,,?POSSIBLE MRI CONTRAST ALLERGY, ALL STUDIES DONE "SOMEWHERE" IN [place]//A.C., Onset Date: 54627035    Iodine Hives   Naltrexone     Other reaction(s): Unknown     Home Medications  Prior to Admission medications  Medication Sig Start Date End Date Taking? Authorizing Provider  acetaminophen (TYLENOL) 500 MG tablet Take 1,000 mg by mouth 2 (two) times daily as needed for moderate pain or headache.    [provider]  albuterol (VENTOLIN HFA) 108 (90 Base) MCG/ACT inhaler Inhale 2 puffs into the lungs every 6 (six) hours as needed for wheezing or shortness of breath. 08/03/20   [name], FNP  amLODipine (NORVASC) 5 MG tablet Take 1 tablet (5 mg total) by mouth daily. 06/07/21 06/07/22  [name], FNP  apixaban (ELIQUIS) 2.5 MG TABS tablet Take 1 tablet (2.5 mg total) by mouth 2 (two) times daily. 06/13/21   Truitt Merle, MD  Ascorbic Acid (VITAMIN C) 100 MG tablet Take 100 mg by mouth daily.    [provider]  atorvastatin (LIPITOR) 10 MG tablet Take 1 tablet (10 mg total) by mouth daily. 06/05/21   Cantwell, Celeste C, PA-C  AURYXIA 1 GM 210 MG(Fe) tablet Take 420-840 mg by mouth with breakfast, with lunch, and with evening meal. Taking  2 tablets daily (420mg ) with each snack 05/15/21   [provider]  Biotin 10000 MCG TABS Take 10,000 mcg by mouth daily.     [provider]  carvedilol (COREG) 12.5 MG tablet Take 12.5 mg by mouth 2 (two) times  daily with a meal.     [provider]  lanthanum (FOSRENOL) 1000 MG chewable tablet Chew 1,000 mg by mouth See admin instructions. Take 1000 mg with each meal and each snack    [provider]  meclizine (ANTIVERT) 12.5 MG tablet Take 1 tablet (12.5 mg total) by mouth 3 (three) times daily as needed for dizziness. 04/12/21   Glendale Chard, MD  multivitamin (RENA-VIT) TABS tablet Take 1 tablet by mouth at bedtime.    Edrick Oh, MD  mupirocin ointment (BACTROBAN) 2 % Apply 1 application topically daily as needed (skin bumps). 04/15/19   [name], FNP  nystatin (NYSTATIN) powder Apply 1 application topically 4 (four) times daily as needed. Patient taking differently: Apply 1 application topically 4 (four) times daily as needed (irriation). 08/03/20   [name], FNP  omeprazole (PRILOSEC) 20 MG capsule Take 1 capsule (20 mg total) by mouth daily. 05/17/21   Truitt Merle, MD  predniSONE (DELTASONE) 10 MG tablet Take 4 tablets (40 mg) daily for 6 days, then, Take 3 tablets (30 mg) daily for 6 days, then, Take 2 tablets (20 mg) daily for 6 days, then, Take 1 tablets (10 mg) daily for 2 days, then stop 05/31/21   Jonetta Osgood, MD  vitamin E 200 UNIT capsule Take 200 Units by mouth daily.    [provider]     Critical care time: 40 min.    Montey Hora, [facility] For pager details, please see AMION or use Epic chat  After 1900, please call [facility] for cross coverage needs 07/16/2021, 6:24 PM

## 2021-07-16 NOTE — ED Triage Notes (Signed)
Pt arrives via EMS for bilateral flank pain. Pt states it has worsened today but has been ongoing for a few weeks. Pt completed her dialysis treatment and came here from the center due to the pain. Pt is unable to walk due to the pain. BP 101/58, HR 120, 97% on room air. Pt does make urine, no pain when she urinates.

## 2021-07-16 NOTE — Progress Notes (Signed)
Pharmacy Antibiotic Note  Whitney Walker is a 65 y.o. female admitted on 07/16/2021 with sepsis.  Pharmacy has been consulted for vancomycin and cefepime dosing. Pt with Tmax 100.1. Labs are currently pending but history of ESRD on HD.   Plan: Vancomycin 2gm IV x 1 then 1g QHD Cefepime 2gm IV x 1 then QHD F/u renal plans, C&S, clinical status and pre-HD vanc level PRN    Temp (24hrs), Avg:100.1 F (37.8 C), Min:100.1 F (37.8 C), Max:100.1 F (37.8 C)  No results for input(s): WBC, CREATININE, LATICACIDVEN, VANCOTROUGH, VANCOPEAK, VANCORANDOM, GENTTROUGH, GENTPEAK, GENTRANDOM, TOBRATROUGH, TOBRAPEAK, TOBRARND, AMIKACINPEAK, AMIKACINTROU, AMIKACIN in the last 168 hours.  CrCl cannot be calculated ([name] most recent lab result is older than the maximum 21 days allowed.).    Allergies  Allergen Reactions   Carnosine     Other reaction(s): Unknown   Gadolinium Derivatives Hives and Other (See Comments)    HIVES, Desc: HIVES W/ "DYE" USED FOR 1ST CT SCAN BUT NOT 2ND, NO PREMEDS USED, PT UNCERTAIN OF CIRCUMSTANCES,,?POSSIBLE MRI CONTRAST ALLERGY, ALL STUDIES DONE "SOMEWHERE" IN [place]//A.C., Onset Date: 28768115   Iohexol Other (See Comments)     Code: HIVES, Desc: HIVES W/ "DYE" USED FOR 1ST CT SCAN BUT NOT 2ND, NO PREMEDS USED, PT UNCERTAIN OF CIRCUMSTANCES,,?POSSIBLE MRI CONTRAST ALLERGY, ALL STUDIES DONE "SOMEWHERE" IN [place]//A.C., Onset Date: [id number]    Iodine Hives   Naltrexone     Other reaction(s): Unknown    Antimicrobials this admission: Vanc 7/18>> Cefepime 7/18>> Flagyl x 1 7/18  Dose adjustments this admission: N/A  Microbiology results: Pending  Thank you for allowing pharmacy to be a part of this patient's care.  Shelagh Rayman, Rande Lawman 07/16/2021 1:41 PM

## 2021-07-17 ENCOUNTER — Inpatient Hospital Stay (HOSPITAL_COMMUNITY): Payer: Medicare Other

## 2021-07-17 ENCOUNTER — Telehealth: Payer: Medicare Other

## 2021-07-17 DIAGNOSIS — B9561 Methicillin susceptible Staphylococcus aureus infection as the cause of diseases classified elsewhere: Secondary | ICD-10-CM | POA: Diagnosis not present

## 2021-07-17 DIAGNOSIS — I339 Acute and subacute endocarditis, unspecified: Secondary | ICD-10-CM | POA: Diagnosis not present

## 2021-07-17 DIAGNOSIS — A419 Sepsis, unspecified organism: Secondary | ICD-10-CM | POA: Diagnosis not present

## 2021-07-17 DIAGNOSIS — R7881 Bacteremia: Secondary | ICD-10-CM | POA: Diagnosis not present

## 2021-07-17 DIAGNOSIS — M545 Low back pain, unspecified: Secondary | ICD-10-CM | POA: Diagnosis not present

## 2021-07-17 DIAGNOSIS — R6520 Severe sepsis without septic shock: Secondary | ICD-10-CM

## 2021-07-17 DIAGNOSIS — N186 End stage renal disease: Secondary | ICD-10-CM

## 2021-07-17 LAB — GASTROINTESTINAL PANEL BY PCR, STOOL (REPLACES STOOL CULTURE)

## 2021-07-17 LAB — BLOOD CULTURE ID PANEL (REFLEXED) - BCID2

## 2021-07-17 LAB — HEPARIN LEVEL (UNFRACTIONATED)
Heparin Unfractionated: 0.16 IU/mL — ABNORMAL LOW (ref 0.30–0.70)
Heparin Unfractionated: 0.16 IU/mL — ABNORMAL LOW (ref 0.30–0.70)

## 2021-07-17 LAB — PHOSPHORUS: Phosphorus: 6.5 mg/dL — ABNORMAL HIGH (ref 2.5–4.6)

## 2021-07-17 LAB — CBC
HCT: 34.8 % — ABNORMAL LOW (ref 36.0–46.0)
Hemoglobin: 10.7 g/dL — ABNORMAL LOW (ref 12.0–15.0)
MCH: 29 pg (ref 26.0–34.0)
MCHC: 30.7 g/dL (ref 30.0–36.0)
MCV: 94.3 fL (ref 80.0–100.0)
Platelets: 146 10*3/uL — ABNORMAL LOW (ref 150–400)
RBC: 3.69 MIL/uL — ABNORMAL LOW (ref 3.87–5.11)
RDW: 16.6 % — ABNORMAL HIGH (ref 11.5–15.5)
WBC: 29.1 10*3/uL — ABNORMAL HIGH (ref 4.0–10.5)
nRBC: 0 % (ref 0.0–0.2)

## 2021-07-17 LAB — BASIC METABOLIC PANEL
Anion gap: 15 (ref 5–15)
BUN: 30 mg/dL — ABNORMAL HIGH (ref 8–23)
CO2: 15 mmol/L — ABNORMAL LOW (ref 22–32)
Calcium: 7.3 mg/dL — ABNORMAL LOW (ref 8.9–10.3)
Chloride: 101 mmol/L (ref 98–111)
Creatinine, Ser: 7.2 mg/dL — ABNORMAL HIGH (ref 0.44–1.00)
GFR, Estimated: 6 mL/min — ABNORMAL LOW (ref >60)
Glucose, Bld: 131 mg/dL — ABNORMAL HIGH (ref 70–99)
Potassium: 4.9 mmol/L (ref 3.5–5.1)
Sodium: 131 mmol/L — ABNORMAL LOW (ref 135–145)

## 2021-07-17 LAB — APTT
aPTT: 41 seconds — ABNORMAL HIGH (ref 24–36)
aPTT: 45 seconds — ABNORMAL HIGH (ref 24–36)

## 2021-07-17 LAB — ECHOCARDIOGRAM COMPLETE
AR max vel: 1.96 cm2
AV Area VTI: 1.77 cm2
AV Area mean vel: 1.91 cm2
AV Mean grad: 14 mmHg
AV Peak grad: 25.5 mmHg
Ao pk vel: 2.52 m/s
Area-P 1/2: 2.62 cm2
Height: 61 in
S' Lateral: 2.5 cm
Weight: 3315.72 oz

## 2021-07-17 LAB — TROPONIN I (HIGH SENSITIVITY)
Troponin I (High Sensitivity): 83 ng/L — ABNORMAL HIGH (ref <18)
Troponin I (High Sensitivity): 87 ng/L — ABNORMAL HIGH (ref <18)

## 2021-07-17 LAB — GLUCOSE, CAPILLARY
Glucose-Capillary: 116 mg/dL — ABNORMAL HIGH (ref 70–99)
Glucose-Capillary: 120 mg/dL — ABNORMAL HIGH (ref 70–99)
Glucose-Capillary: 136 mg/dL — ABNORMAL HIGH (ref 70–99)
Glucose-Capillary: 156 mg/dL — ABNORMAL HIGH (ref 70–99)
Glucose-Capillary: 95 mg/dL (ref 70–99)
Glucose-Capillary: 97 mg/dL (ref 70–99)

## 2021-07-17 LAB — MAGNESIUM: Magnesium: 1.7 mg/dL (ref 1.7–2.4)

## 2021-07-17 IMAGING — US US SOFT TISSUE
1 series · 14 of 16 positions shown · non-contrast
Comparison: None similar

CLINICAL DATA: Evaluate AV fistula abscess. Fever and bacteremia
with painful catheter

EXAM:
ULTRASOUND OF right chest SOFT TISSUES
TECHNIQUE: Ultrasound examination was performed in the area of clinical
concern.

[Series 1: us chest soft tissue · 14 of 38 slices shown]
[im 1/38]
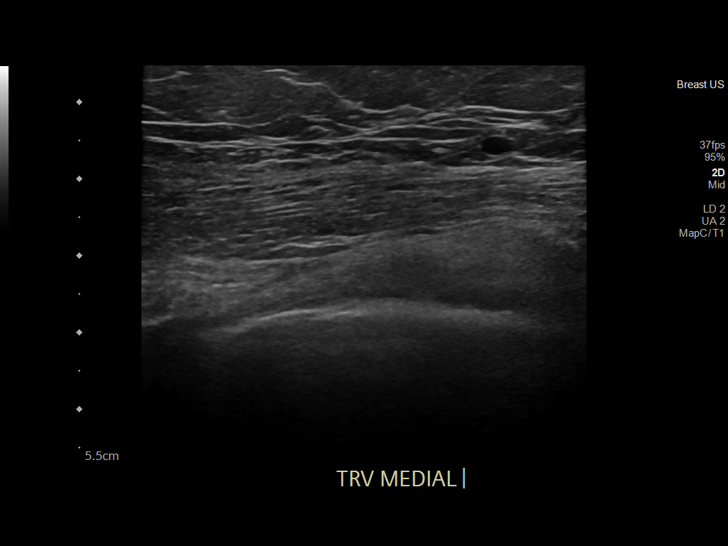
[im 3/38]
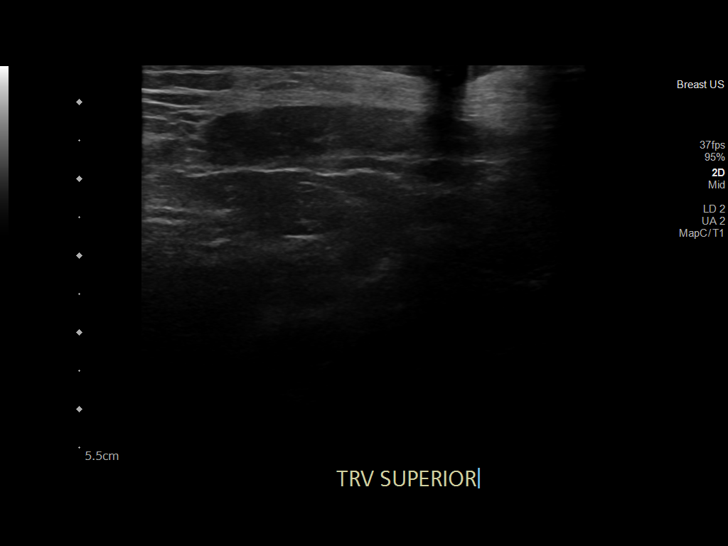
[im 5/38]
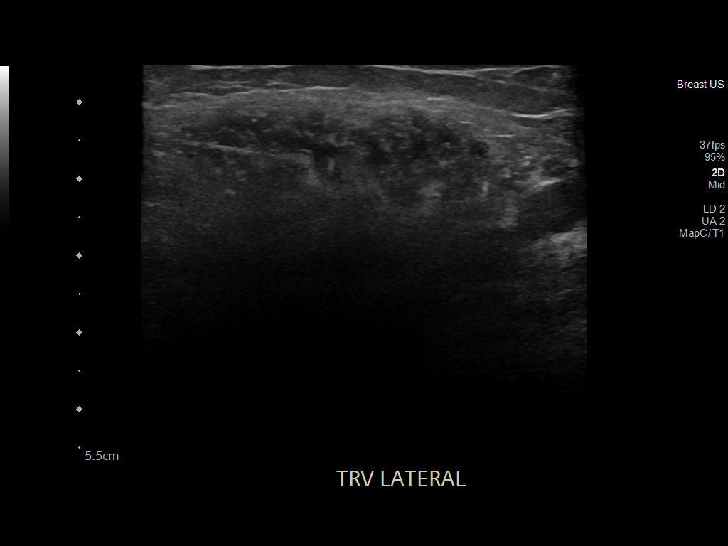
[im 10/38]
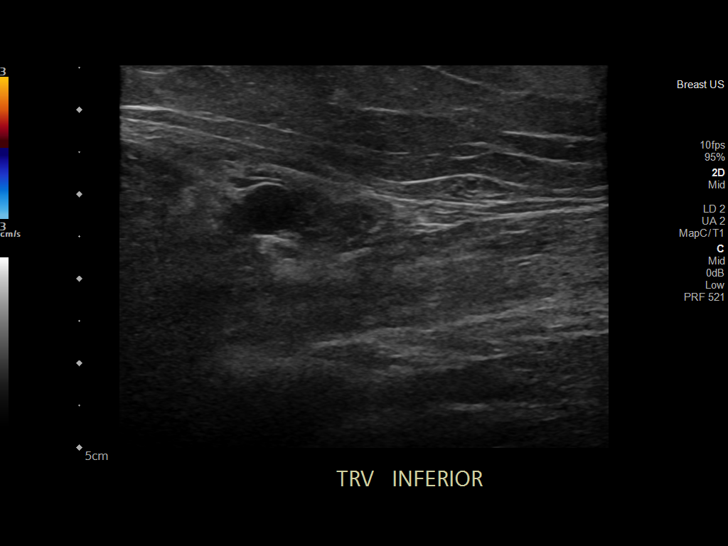
[im 13/38]
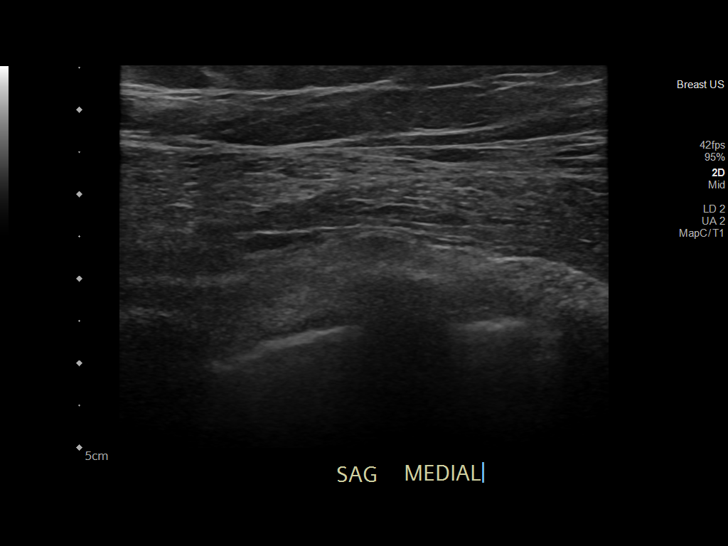
[im 15/38]
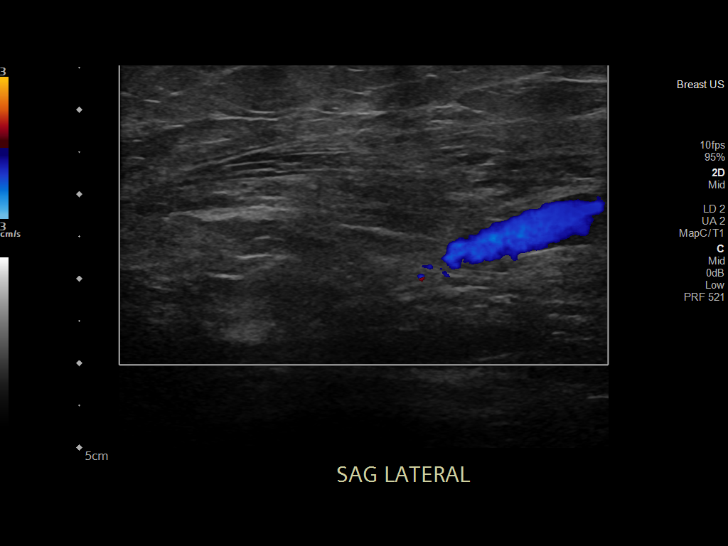
[im 18/38]
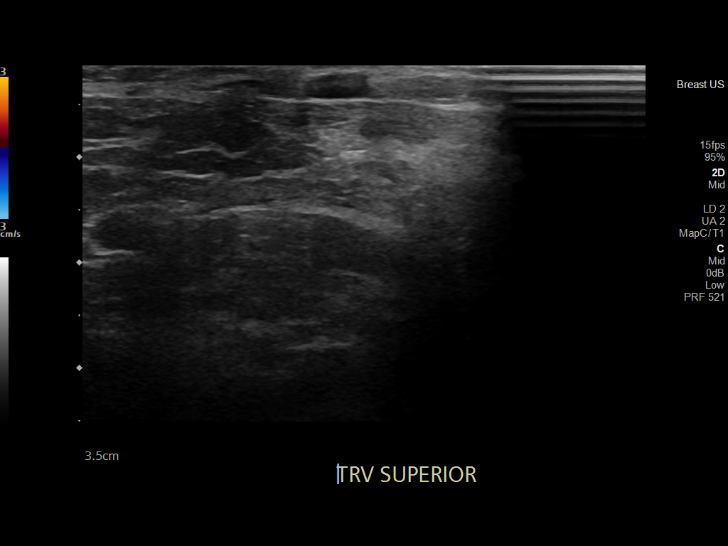
[im 20/38]
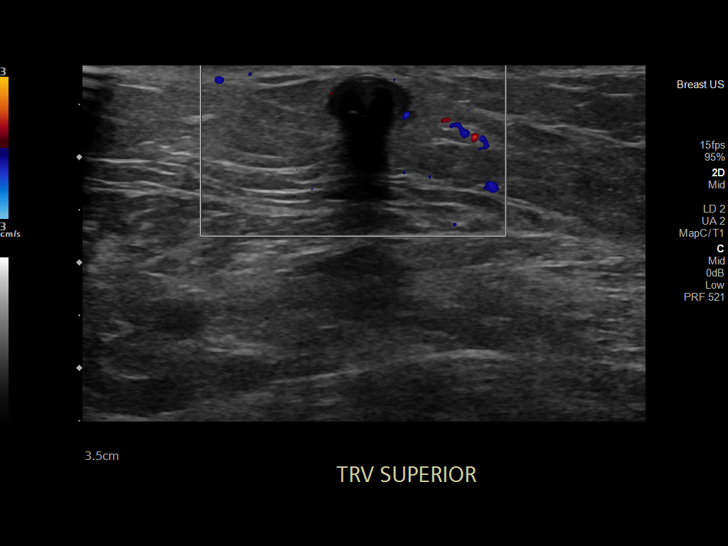
[im 23/38]
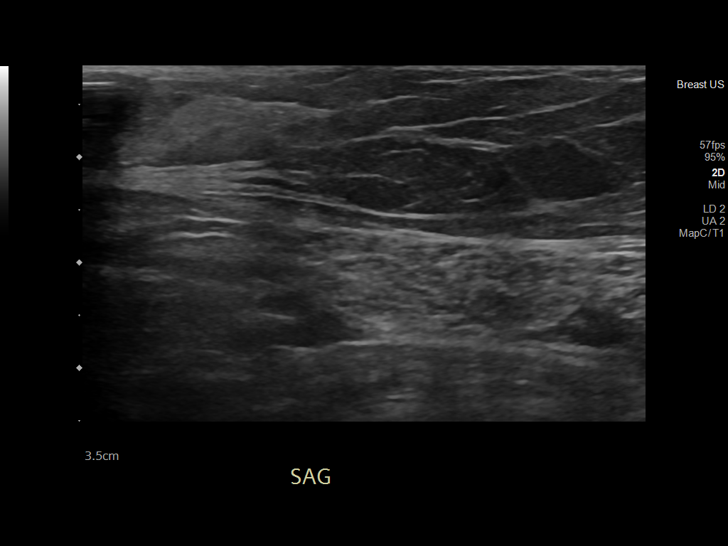
[im 25/38]
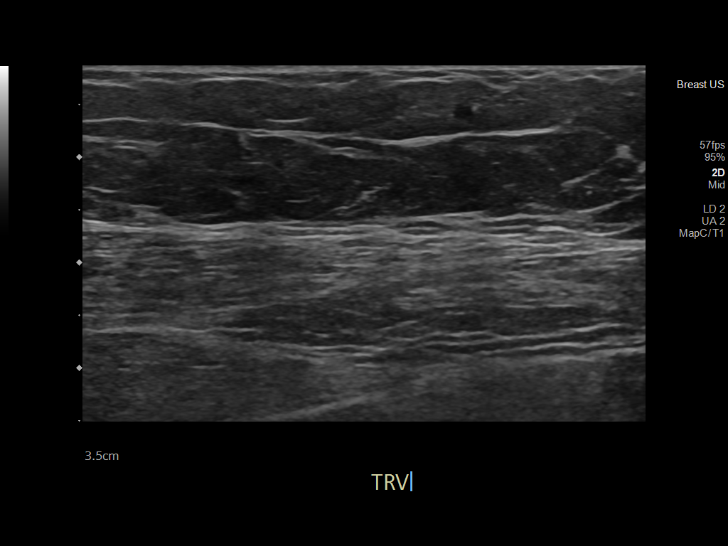
[im 30/38]
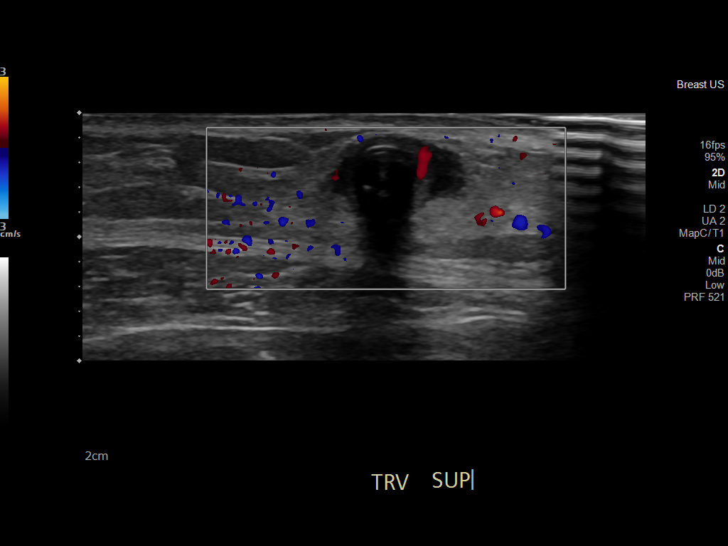
[im 33/38]
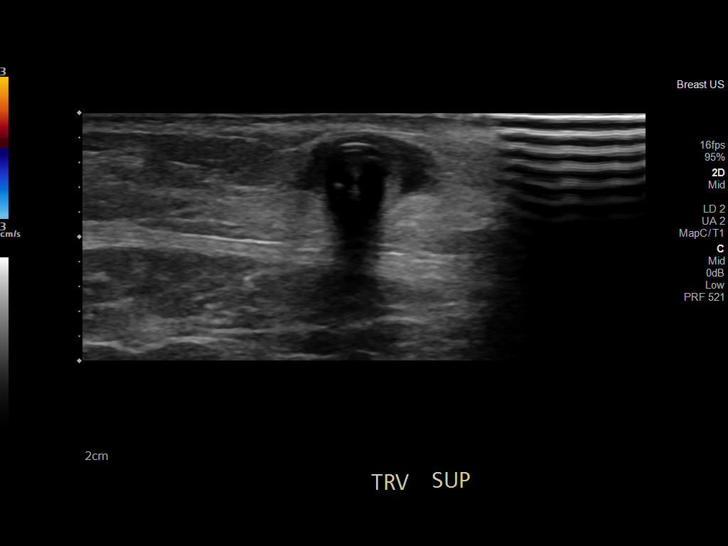
[im 35/38]
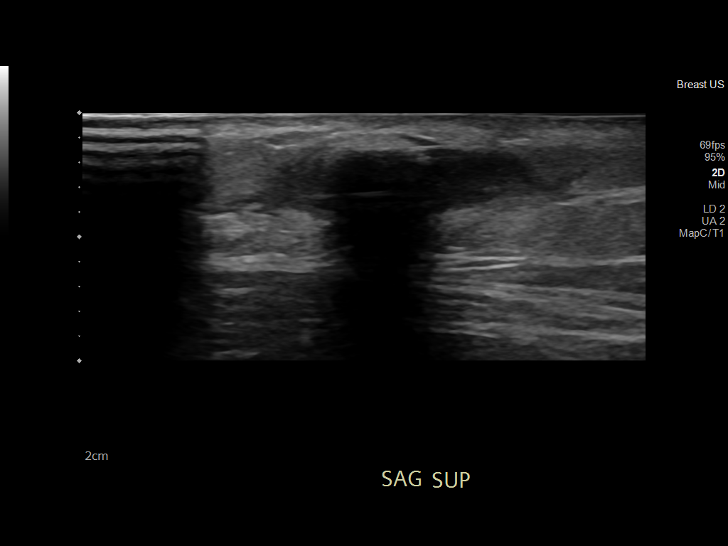
[im 38/38]
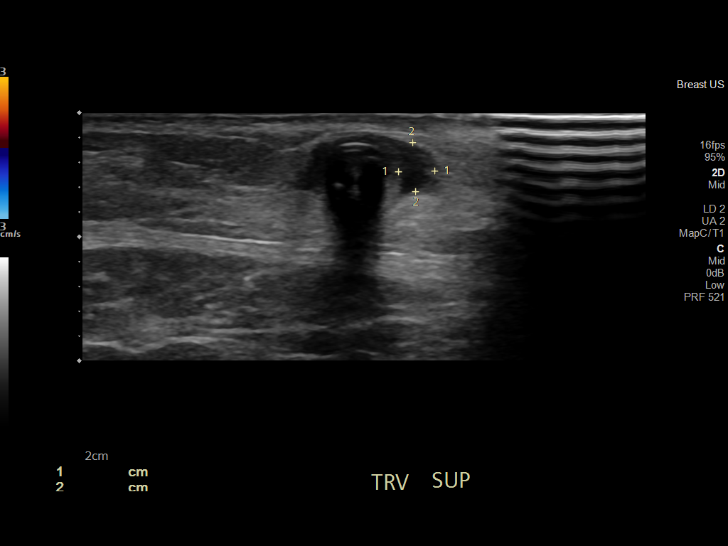

[14 of 16 positions shown; findings below may reference images not displayed]

FINDINGS: Dialysis catheter in the right chest which had an unremarkable
appearance on recent chest x-ray. There is fluid encompassing the
catheter along the upper aspect of the subcutaneous portion
collection measuring 3-4 mm in maximal thickness. No separate
abscess is seen.
IMPRESSION: KLING catheter on the right. A thin amount of fluid encompasses the
upper subcutaneous catheter, history suggesting purulence.

## 2021-07-17 IMAGING — DX DG CHEST 1V PORT
1 series · 1 of 1 positions shown · non-contrast
Comparison: CT [DATE].  Chest x-ray [DATE].

CLINICAL DATA: Respiratory failure.

EXAM:
PORTABLE CHEST 1 VIEW

[chest ap]
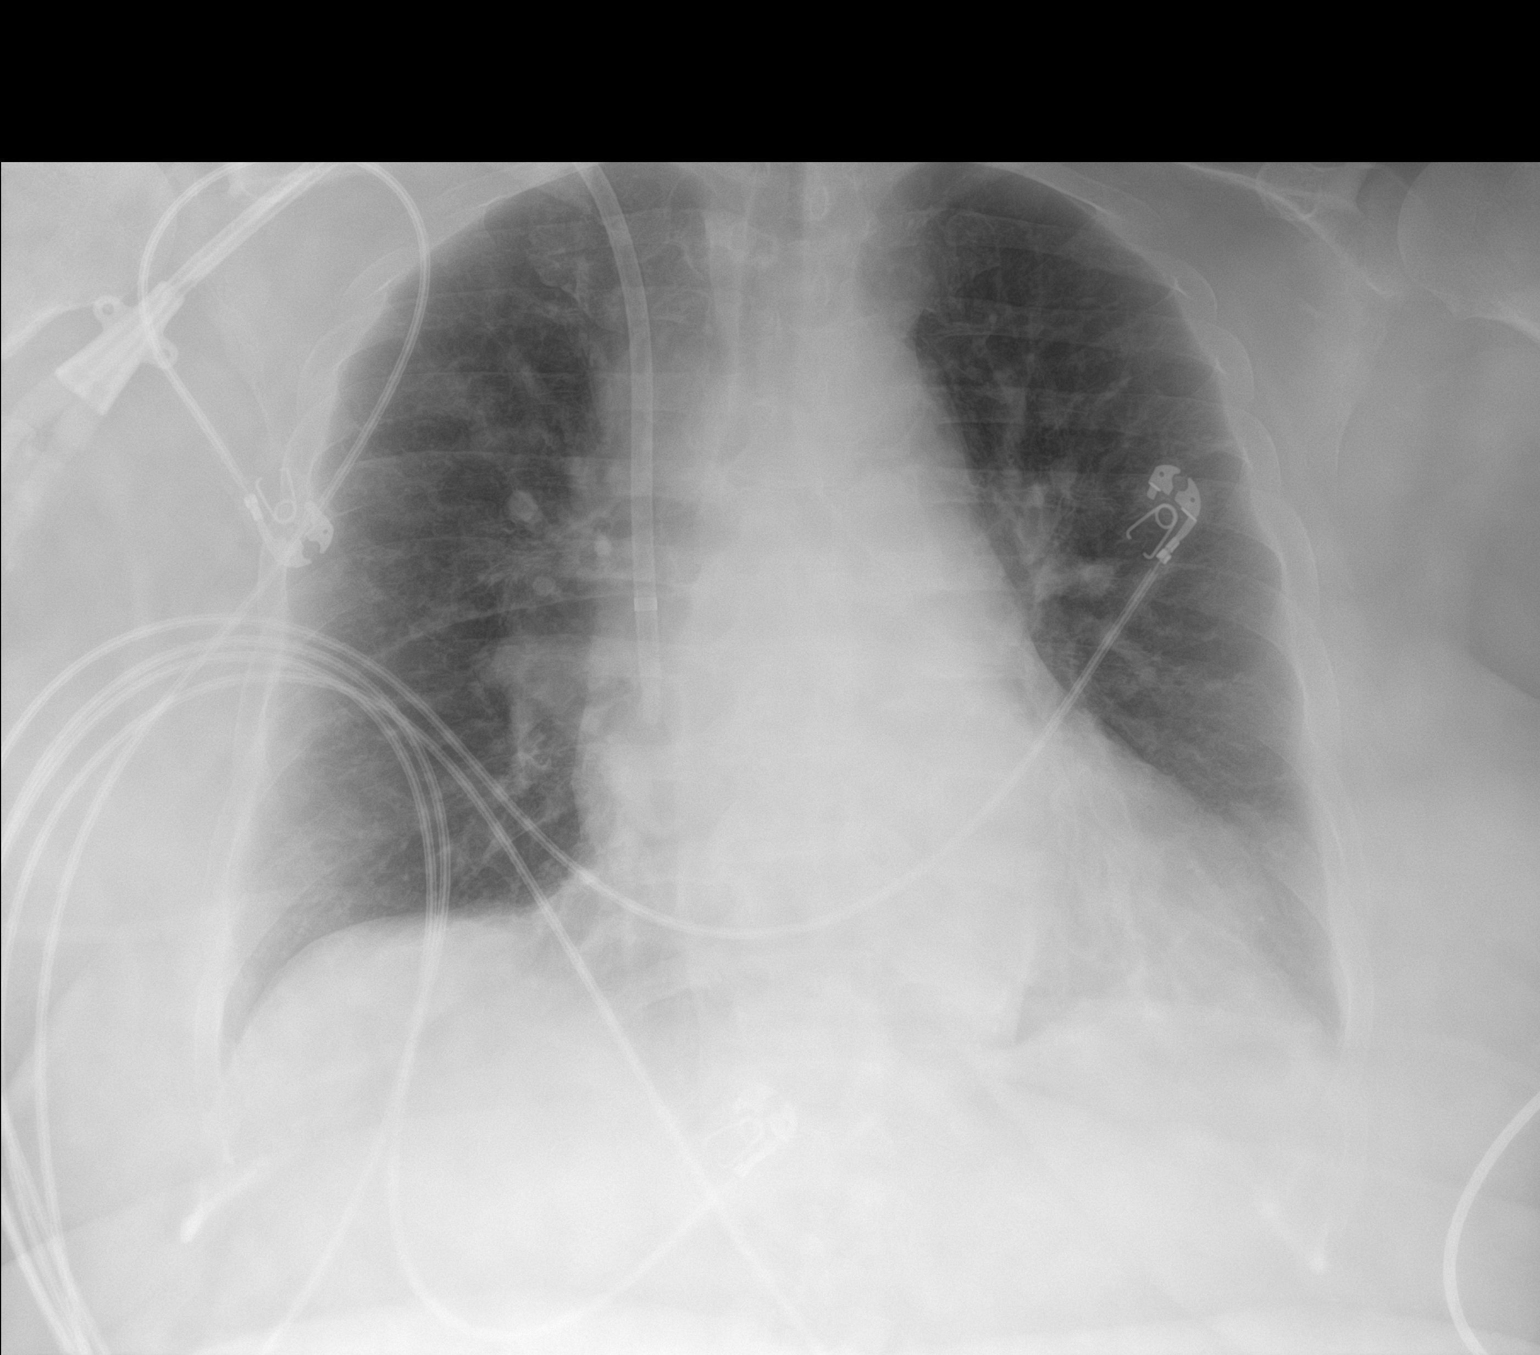

[1 of 1 positions shown; findings below may reference images not displayed]

FINDINGS: Dialysis catheter stable position. Cardiomegaly with pulmonary
venous congestion noted. Low lung volumes with mild bibasilar
atelectasis. No focal infiltrate or edema. No prominent pleural
effusion. No pneumothorax. Reference is made to recent chest CT
report for discussion of pulmonary nodules present. No acute bony
abnormality identified.
IMPRESSION: 1.  Dialysis catheter stable position.

2. Cardiomegaly with pulmonary venous congestion. Low lung volumes
with mild bibasilar atelectasis. No focal infiltrate or edema.

## 2021-07-17 IMAGING — US US ABDOMEN COMPLETE
1 series · 13 of 25 positions shown · non-contrast
Comparison: CT [DATE]

CLINICAL DATA: Septic shock

EXAM:
ABDOMEN ULTRASOUND COMPLETE

[Series 1: us abdomen complete · 63 acquisitions, 13 frames shown]
[im 1/63]
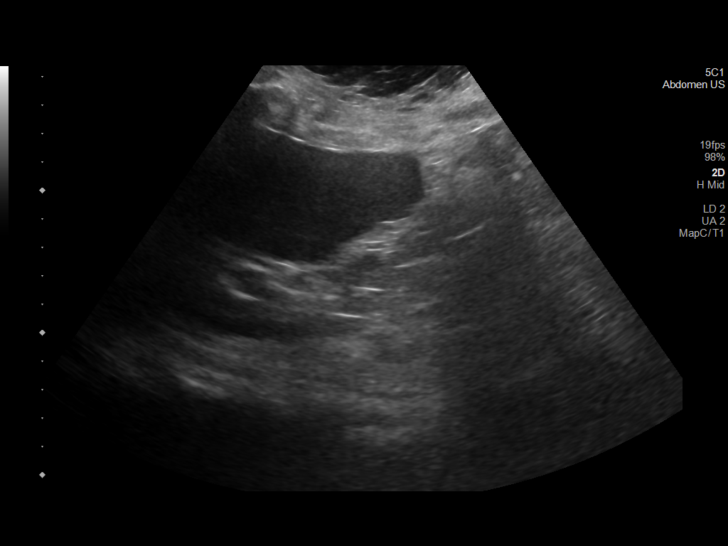
[im 6/63]
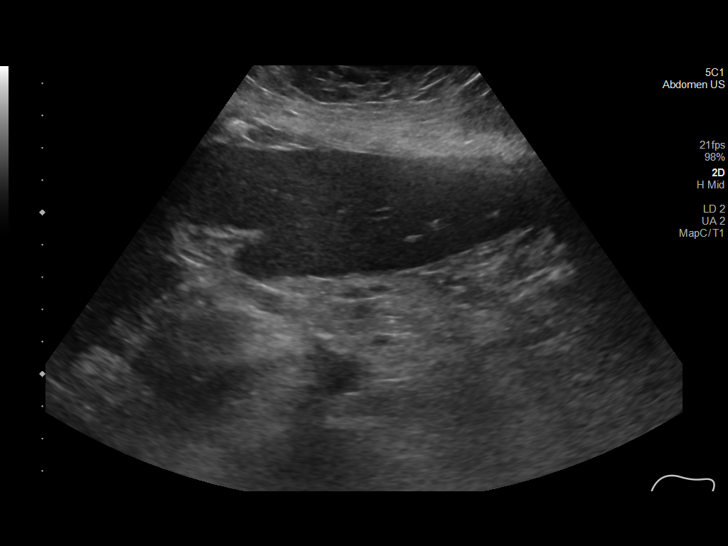
[im 11/63]
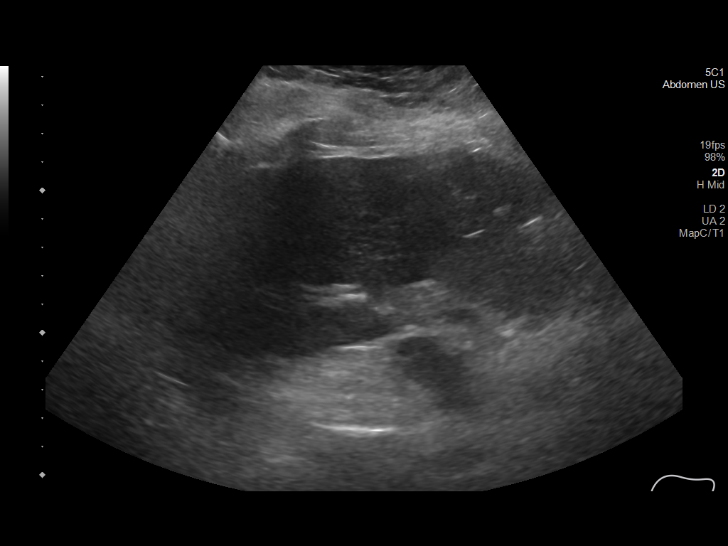
[im 16/63]
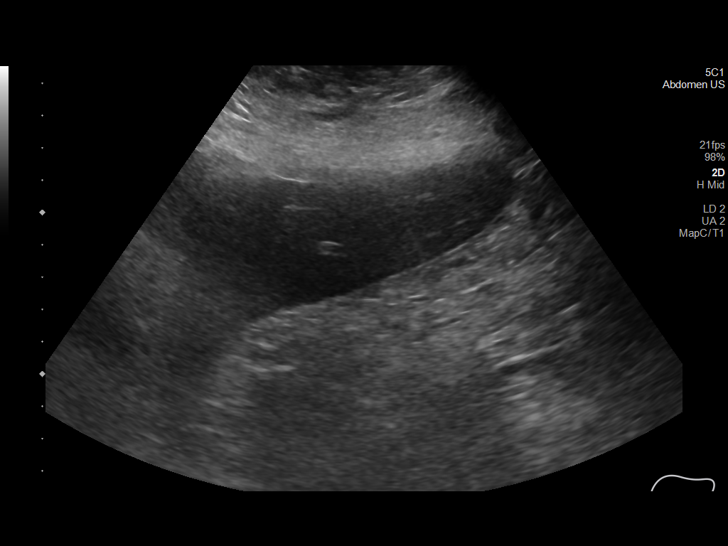
[im 21/63]
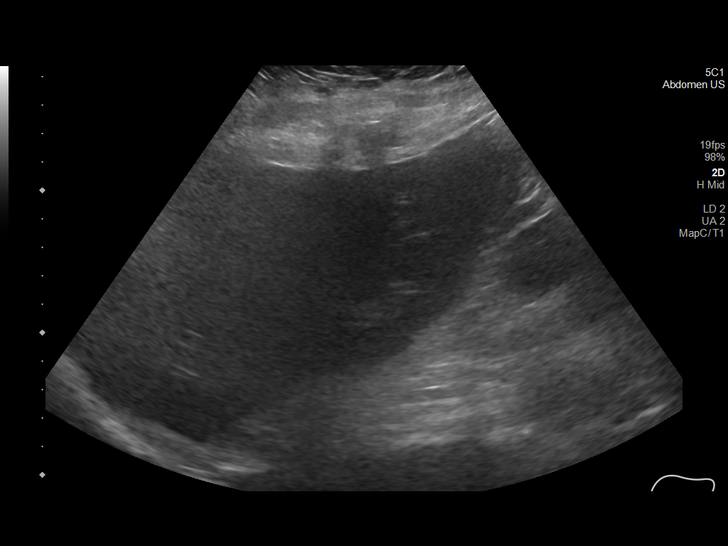
[im 26/63]
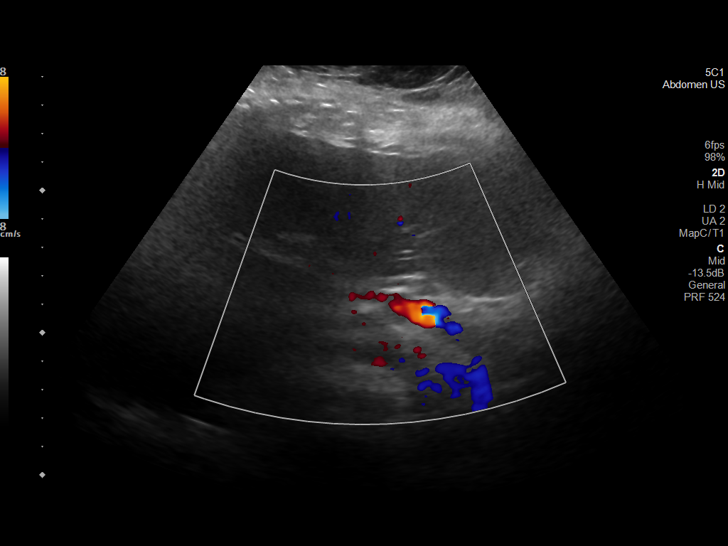
[im 32/63]
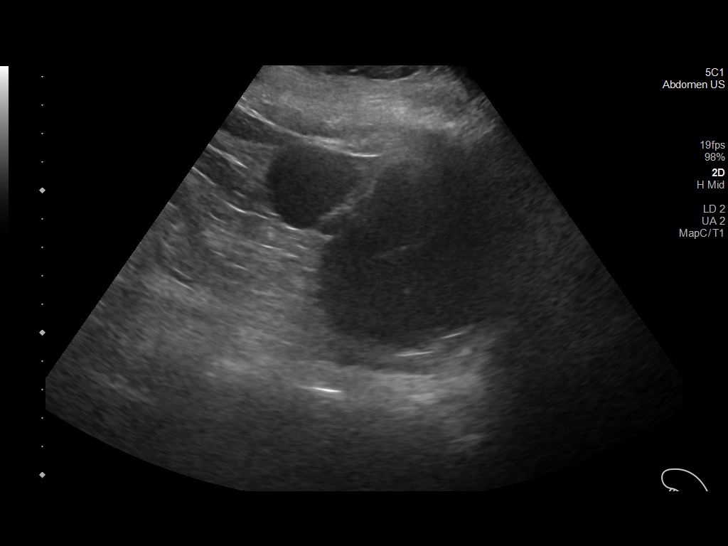
[im 37/63]
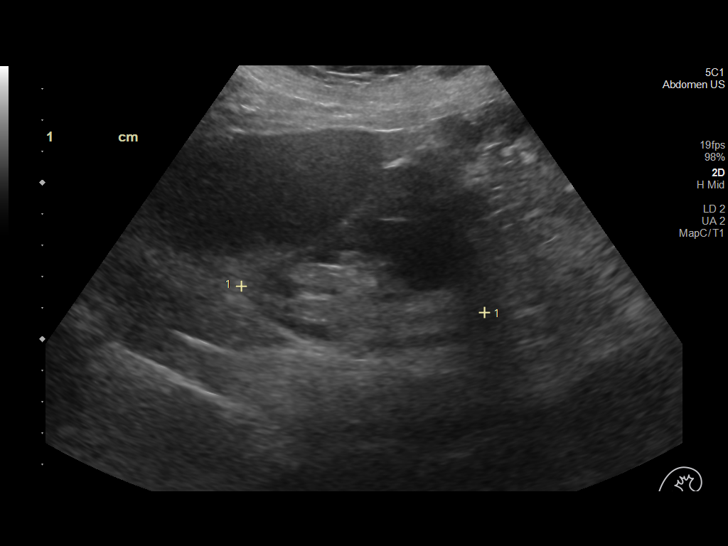
[im 42/63]
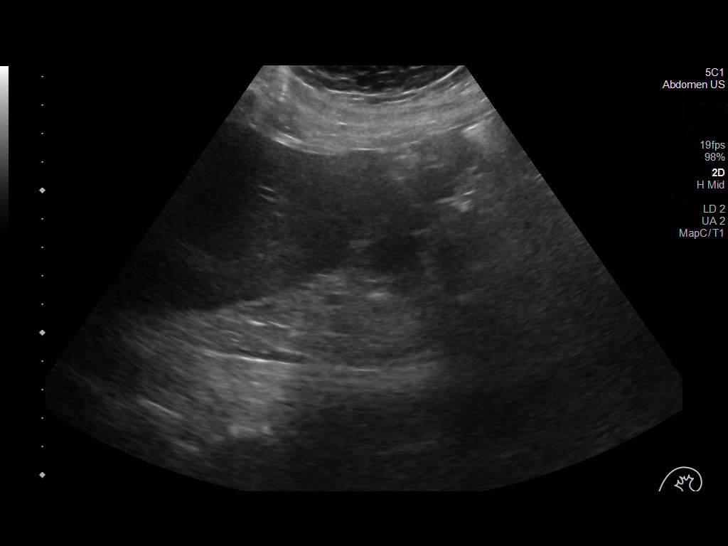
[im 47/63]
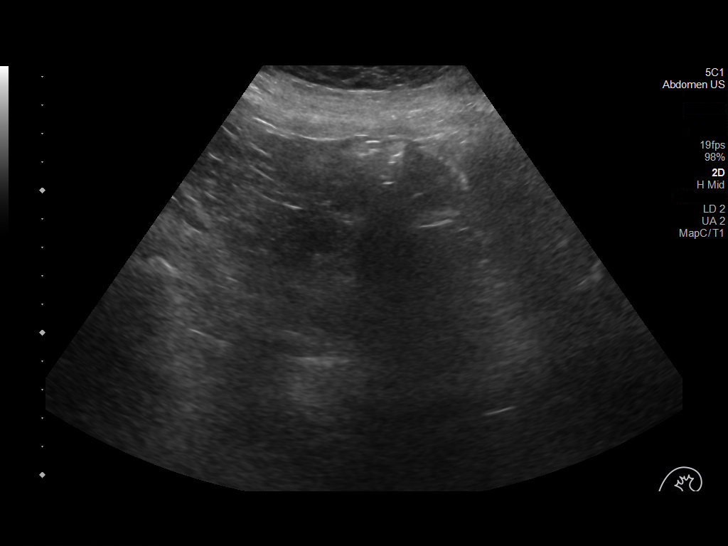
[im 52/63]
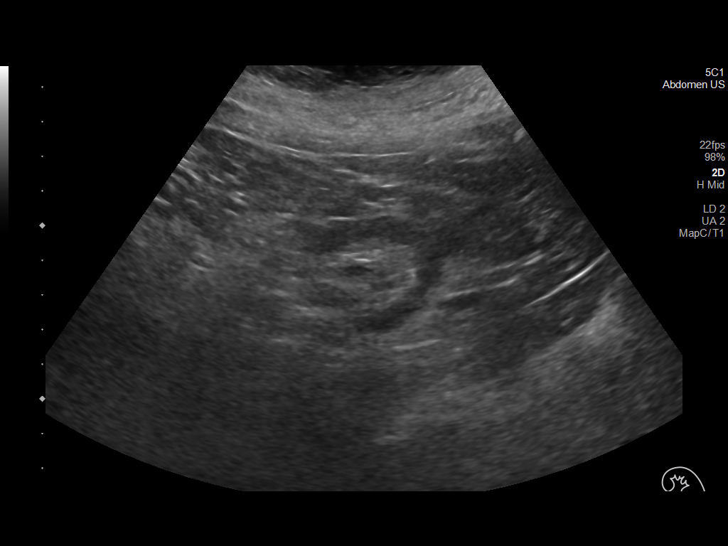
[im 57/63]
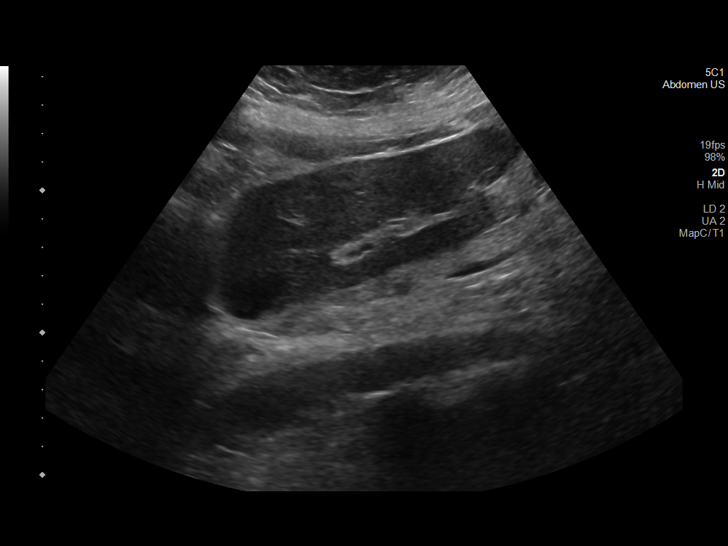
[im 63/63]
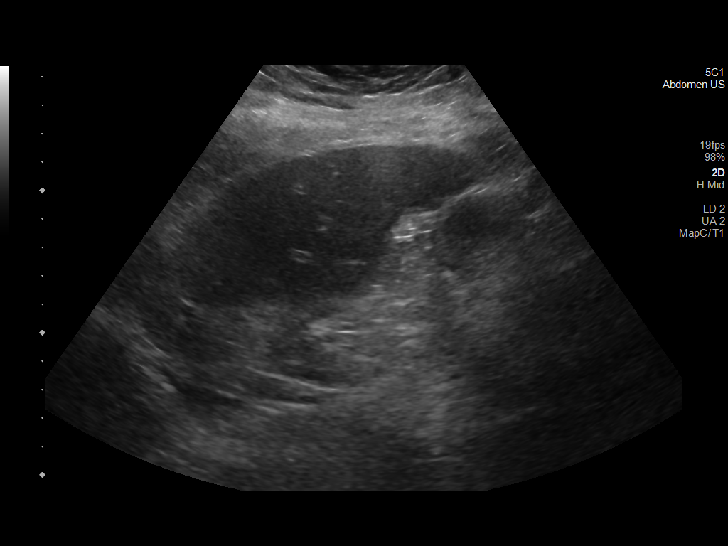

[13 of 25 positions shown; findings below may reference images not displayed]

FINDINGS: Gallbladder: Absent

Common bile duct: Diameter: 5 mm in proximal diameter

Liver: No focal lesion identified. Within normal limits in
parenchymal echogenicity. Portal vein is patent on color Doppler
imaging with normal direction of blood flow towards the liver.

IVC: No abnormality visualized.

Pancreas: Visualized portion unremarkable.

Spleen: Size and appearance within normal limits.

Right Kidney: Length: 7.8 cm. Renal cortical echogenicity is within
normal limits. Mild to moderate renal cortical atrophy is noted. The
2 cortical lesions in question noted on prior CT examination
represents simple cysts measuring 2.9 and 2.5 cm in greatest
dimension. No solid intrarenal masses are seen. No intrarenal
calcifications are seen. There is no hydronephrosis.

Left Kidney: Length: 7.7 cm. Renal cortical echogenicity is within
normal limits. Mild to moderate cortical atrophy is noted. No
hydronephrosis. No intrarenal masses or calcifications are seen.

Abdominal aorta: No aneurysm visualized.

Other findings: None.
IMPRESSION: Status post cholecystectomy.

Mild to moderate bilateral renal cortical atrophy.

Indeterminate cortical lesions noted within the right kidney on
prior CT examination represents simple cortical cyst. Further
follow-up is not required.

## 2021-07-17 MED ORDER — CEFAZOLIN SODIUM-DEXTROSE 1-4 GM/50ML-% IV SOLN
1.0000 g | INTRAVENOUS | Status: DC
  Administered 2021-07-18 – 2021-07-21 (×4): 1 g via INTRAVENOUS
  Filled 2021-07-17 (×5): qty 50

## 2021-07-17 MED ORDER — LANTHANUM CARBONATE 500 MG PO CHEW
1000.0000 mg | CHEWABLE_TABLET | Freq: Three times a day (TID) | ORAL | Status: DC
  Filled 2021-07-17 (×7): qty 2

## 2021-07-17 MED ORDER — LIDOCAINE 5 % EX PTCH: 1.0000 | MEDICATED_PATCH | CUTANEOUS | Status: DC

## 2021-07-17 MED ORDER — CEFAZOLIN SODIUM-DEXTROSE 2-4 GM/100ML-% IV SOLN
2.0000 g | Freq: Once | INTRAVENOUS | Status: AC
  Administered 2021-07-17: 2 g via INTRAVENOUS
  Filled 2021-07-17: qty 100

## 2021-07-17 MED ORDER — NAFCILLIN SODIUM 2 G IJ SOLR
2.0000 g | INTRAMUSCULAR | Status: DC
  Filled 2021-07-17 (×2): qty 2000

## 2021-07-17 MED ORDER — PANTOPRAZOLE SODIUM 40 MG PO TBEC
40.0000 mg | DELAYED_RELEASE_TABLET | Freq: Every day | ORAL | Status: DC
  Administered 2021-07-17 – 2021-07-22 (×6): 40 mg via ORAL
  Filled 2021-07-17 (×6): qty 1

## 2021-07-17 MED ORDER — ACETAMINOPHEN 325 MG PO TABS
650.0000 mg | ORAL_TABLET | ORAL | Status: DC | PRN
Start: 2021-07-17 — End: 2021-07-22
  Administered 2021-07-19 – 2021-07-22 (×3): 650 mg via ORAL
  Filled 2021-07-17 (×3): qty 2

## 2021-07-17 MED ORDER — PROSOURCE PLUS PO LIQD
30.0000 mL | Freq: Two times a day (BID) | ORAL | Status: DC
  Administered 2021-07-17: 30 mL via ORAL
  Filled 2021-07-17 (×6): qty 30

## 2021-07-17 MED ORDER — OXYCODONE HCL 5 MG PO TABS
5.0000 mg | ORAL_TABLET | Freq: Four times a day (QID) | ORAL | Status: AC | PRN
  Administered 2021-07-17 – 2021-07-19 (×4): 5 mg via ORAL
  Filled 2021-07-17 (×4): qty 1

## 2021-07-17 MED ORDER — ASPIRIN 300 MG RE SUPP
150.0000 mg | Freq: Every day | RECTAL | Status: DC
  Administered 2021-07-17: 150 mg via RECTAL
  Filled 2021-07-17: qty 1

## 2021-07-17 MED ORDER — LIDOCAINE 5 % EX PTCH
1.0000 | MEDICATED_PATCH | Freq: Every day | CUTANEOUS | Status: DC | PRN
  Administered 2021-07-17: 1 via TRANSDERMAL
  Filled 2021-07-17: qty 1

## 2021-07-17 MED ORDER — CHLORHEXIDINE GLUCONATE CLOTH 2 % EX PADS
6.0000 | MEDICATED_PAD | Freq: Every day | CUTANEOUS | Status: DC
  Administered 2021-07-17 – 2021-07-22 (×5): 6 via TOPICAL

## 2021-07-17 NOTE — Progress Notes (Signed)
[place] for Heparin Indication: Hx of DVT  Allergies  Allergen Reactions   Carnosine     Other reaction(s): Unknown   Gadolinium Derivatives Hives and Other (See Comments)    HIVES, Desc: HIVES W/ "DYE" USED FOR 1ST CT SCAN BUT NOT 2ND, NO PREMEDS USED, PT UNCERTAIN OF CIRCUMSTANCES,,?POSSIBLE MRI CONTRAST ALLERGY, ALL STUDIES DONE "SOMEWHERE" IN [place]//A.C., Onset Date: [id number]   Iohexol Other (See Comments)     Code: HIVES, Desc: HIVES W/ "DYE" USED FOR 1ST CT SCAN BUT NOT 2ND, NO PREMEDS USED, PT UNCERTAIN OF CIRCUMSTANCES,,?POSSIBLE MRI CONTRAST ALLERGY, ALL STUDIES DONE "SOMEWHERE" IN [place]//A.C., Onset Date: [id number]    Iodine Hives   Naltrexone     Other reaction(s): Unknown    Patient Measurements: Height: 5\' 1"  (154.9 cm) Weight: 94 kg (207 lb 3.7 oz) IBW/kg (Calculated) : 47.8 Heparin Dosing Weight: 69 kg   Vital Signs: Temp: 98.3 F (36.8 C) (07/19 0324) Temp Source: Axillary (07/19 0324) BP: 95/39 (07/19 0400) Pulse Rate: 93 (07/19 0400)  Labs: Recent Labs    07/16/21 1402 07/17/21 0408  HGB 11.9* 10.7*  HCT 37.8 34.8*  PLT 143* 146*  APTT 24 41*  LABPROT 16.5*  --   INR 1.3*  --   HEPARINUNFRC  --  0.16*  CREATININE 7.31* 7.20*  TROPONINIHS  --  83*     Estimated Creatinine Clearance: 8 mL/min (A) (by C-G formula based on SCr of 7.2 mg/dL (H)).   Medical History: Past Medical History:  Diagnosis Date   Anemia    Anxiety    CHF (congestive heart failure) ([place])    Colon cancer ([place])    treatment surgery   Complication of anesthesia    after first C- [place] "couldnt walk after", patient denies having a spinal   COPD (chronic obstructive pulmonary disease) ([place])    Coronary artery disease    Depression    DVT (deep venous thrombosis) ([facility])    ESRD (end stage renal disease) ([place])    Hemo: MWF   History of blood transfusion 04/2018   Hypertension    07/07/18- no longer takes BP  medications   Meningitis    Pain in limb 07/30/2013   PE (pulmonary embolism)    Peripheral vascular disease ([facility])    Restless legs    Shortness of breath    with exertion   Sleep apnea    SOB (shortness of breath) 03/03/2019   Vertigo     Medications:  Eliquis 2.5 mg BID PTA  Assessment: 51 YOF presents to ED critically ill due to septic shock. Patient has had hx of VTE and is currently on Eliquis 2.5 mg PO BID. Last dose unknown. Suspect last dose in AM  Hg 11.9; Hct 37.8, PLT 143  7/19 AM update:  aPTT low  Goal of Therapy:  Heparin level 0.3-0.7 units/ml aPTT 66-102 seconds Monitor platelets by anticoagulation protocol: Yes   Plan:  Inc heparin to 1150 units/hr 1400 aPTT and heparin level  Narda Bonds, PharmD, BCPS Clinical Pharmacist Phone: [phone]

## 2021-07-17 NOTE — Consult Note (Signed)
Please see PGY2 [place]'s note for detail   65 yo female esrd on iHD via right chest tunneled cath (placed 1-2 years prior to admission) admitted with 2 weeks of malaise and progressive lower back pain found to have mssa bacteremia. She has nonfunctional AVF LUE that was last accessed >1 year prior to admission. Brief hypotension. Currently doing well outside of the back pain  Also seen 1 month prior for erythematous nodular rash in LLE that were painful. Negative bcx then; no abx given at that time  No cardiac device/prosthetic joints/catheters otherwise  Exam: Well appearing Tender lower lumbar midline Per her improving nontender now lle rash Right chest tunneled cath site clean without erythema/fluctuance/discharge   A/p Complicated mssa bacteremia Lower back pain Esrd Presence of catheter  -f/u repeat bcx -tte -lumbar mri -continue cefazolin -agree can dialyze tomorrow via current HD cath then remove and give 2-3 days line holiday -discussed with primary team    I spent 60 minute reviewing data/chart, and coordinating care and >50% direct face to face time providing counseling/discussing diagnostics/treatment plan with patient      [name], [place] for [place] [phone]  pager   [phone] cell 07/17/2021, 2:23 PM

## 2021-07-17 NOTE — Progress Notes (Signed)
Pharmacy Antibiotic Note  Whitney Walker is a 65 y.o. female admitted on 07/16/2021 with bacteremia.  Pharmacy has been consulted for Ancef dosing. Patient with ESRD receiving HD MWF prior to admission. Patient came from completed full HD session on Monday for worsening back pain, fever, and hypotension. Blood cultures are positive for MSSA. Patient is currently requiring pressors.   Plan: Ancef 2g IV x1 now then 1g IV every 24 hours.  Follow-up plans for iHD versus CRRT to further dose antibiotics.   Height: 5\' 1"  (154.9 cm) Weight: 94 kg (207 lb 3.7 oz) IBW/kg (Calculated) : 47.8  Temp (24hrs), Avg:98.9 F (37.2 C), Min:97.9 F (36.6 C), Max:100.1 F (37.8 C)  Recent Labs  Lab 07/16/21 1402 07/16/21 1510 07/17/21 0408  WBC 26.9*  --  29.1*  CREATININE 7.31*  --  7.20*  LATICACIDVEN 3.3* 1.7  --     Estimated Creatinine Clearance: 8 mL/min (A) (by C-G formula based on SCr of 7.2 mg/dL (H)).    Allergies  Allergen Reactions   Carnosine     Other reaction(s): Unknown   Gadolinium Derivatives Hives and Other (See Comments)    HIVES, Desc: HIVES W/ "DYE" USED FOR 1ST CT SCAN BUT NOT 2ND, NO PREMEDS USED, PT UNCERTAIN OF CIRCUMSTANCES,,?POSSIBLE MRI CONTRAST ALLERGY, ALL STUDIES DONE "SOMEWHERE" IN [place]//A.C., Onset Date: [id number]   Iohexol Other (See Comments)     Code: HIVES, Desc: HIVES W/ "DYE" USED FOR 1ST CT SCAN BUT NOT 2ND, NO PREMEDS USED, PT UNCERTAIN OF CIRCUMSTANCES,,?POSSIBLE MRI CONTRAST ALLERGY, ALL STUDIES DONE "SOMEWHERE" IN [place]//A.C., Onset Date: [id number]    Iodine Hives   Naltrexone     Other reaction(s): Unknown    Antimicrobials this admission: Vanc 7/18>>7/19 Cefepime 7/18>>7/19 Ancef 7/19 >> Flagyl x 1 7/18  Dose adjustments this admission:   Microbiology results: 7/18 BCx: MSSA 7/19 BCx >> 7/18 Cdiff negative 7/18 GI panel >> 7/18 UCx: sent  Thank you for allowing pharmacy to be a part of this patient's  care.  Sloan Leiter, PharmD, BCPS, BCCCP Clinical Pharmacist Please refer to [facility] for [place] numbers 07/17/2021 8:27 AM

## 2021-07-17 NOTE — Progress Notes (Signed)
[place] Progress Note Patient Name: Whitney Walker DOB: 1956-04-18 MRN: [id number]   Date of Service  07/17/2021  HPI/Events of Note  Patient complaining on lumbo-sacral pain as well as musculoskeletal chest wall pain, she rates the lumbar pain more significant than the chest discomfort and is insisting that the Lidocaine patch be deployed to address her lumbar pain instead of chest pain for which it was originally ordered.  eICU Interventions  Lidocaine patch switched to lumbar area and PRN Tylenol +  limited PRN Oxycodone ordered for additional pains / breakthrough pains.        Frederik Pear 07/17/2021, 9:43 PM

## 2021-07-17 NOTE — Progress Notes (Signed)
PHARMACY - PHYSICIAN COMMUNICATION CRITICAL VALUE ALERT - BLOOD CULTURE IDENTIFICATION (BCID)  Whitney Walker is an 65 y.o. female who presented to [facility] on [facility]/16/2021 with a chief complaint of bilateral flank pain  Assessment:  Pt with MSSA bacteremia  Name of physician (or Provider) Contacted: Dr. Oletta Darter  Current antibiotics: Cefepime and Vancomycin  Changes to prescribed antibiotics recommended:  Recommend change abx to Ancef 2gm IV q8h. Dr. Oletta Darter will defer to dayshift rounding MD  Results for orders placed or performed during the hospital encounter of 07/16/21  Blood Culture ID Panel (Reflexed) (Collected: 07/16/2021  2:04 PM)  Result Value Ref Range   Enterococcus faecalis NOT DETECTED NOT DETECTED   Enterococcus Faecium NOT DETECTED NOT DETECTED   Listeria monocytogenes NOT DETECTED NOT DETECTED   Staphylococcus species DETECTED (A) NOT DETECTED   Staphylococcus aureus (BCID) DETECTED (A) NOT DETECTED   Staphylococcus epidermidis NOT DETECTED NOT DETECTED   Staphylococcus lugdunensis NOT DETECTED NOT DETECTED   Streptococcus species NOT DETECTED NOT DETECTED   Streptococcus agalactiae NOT DETECTED NOT DETECTED   Streptococcus pneumoniae NOT DETECTED NOT DETECTED   Streptococcus pyogenes NOT DETECTED NOT DETECTED   A.calcoaceticus-baumannii NOT DETECTED NOT DETECTED   Bacteroides fragilis NOT DETECTED NOT DETECTED   Enterobacterales NOT DETECTED NOT DETECTED   Enterobacter cloacae complex NOT DETECTED NOT DETECTED   Escherichia coli NOT DETECTED NOT DETECTED   Klebsiella aerogenes NOT DETECTED NOT DETECTED   Klebsiella oxytoca NOT DETECTED NOT DETECTED   Klebsiella pneumoniae NOT DETECTED NOT DETECTED   Proteus species NOT DETECTED NOT DETECTED   Salmonella species NOT DETECTED NOT DETECTED   Serratia marcescens NOT DETECTED NOT DETECTED   Haemophilus influenzae NOT DETECTED NOT DETECTED   Neisseria meningitidis NOT DETECTED NOT DETECTED   Pseudomonas  aeruginosa NOT DETECTED NOT DETECTED   Stenotrophomonas maltophilia NOT DETECTED NOT DETECTED   Candida albicans NOT DETECTED NOT DETECTED   Candida auris NOT DETECTED NOT DETECTED   Candida glabrata NOT DETECTED NOT DETECTED   Candida krusei NOT DETECTED NOT DETECTED   Candida parapsilosis NOT DETECTED NOT DETECTED   Candida tropicalis NOT DETECTED NOT DETECTED   Cryptococcus neoformans/gattii NOT DETECTED NOT DETECTED   Meth resistant mecA/C and MREJ NOT DETECTED NOT DETECTED    Sherlon Handing, PharmD, BCPS Please see amion for complete clinical pharmacist phone list 07/17/2021  7:50 AM

## 2021-07-17 NOTE — Progress Notes (Signed)
ANTICOAGULATION CONSULT NOTE  Pharmacy Consult for Heparin Indication: Hx of DVT  Patient Measurements: Height: 5\' 1"  (154.9 cm) Weight: 94 kg (207 lb 3.7 oz) IBW/kg (Calculated) : 47.8 Heparin Dosing Weight: 69 kg   Vital Signs: Temp: 97.9 F (36.6 C) (07/19 1512) Temp Source: Oral (07/19 1512) BP: 73/63 (07/19 1430) Pulse Rate: 86 (07/19 1445)  Labs: Recent Labs    07/16/21 1402 07/17/21 0408 07/17/21 0638 07/17/21 1414  HGB 11.9* 10.7*  --   --   HCT 37.8 34.8*  --   --   PLT 143* 146*  --   --   APTT 24 41*  --  45*  LABPROT 16.5*  --   --   --   INR 1.3*  --   --   --   HEPARINUNFRC  --  0.16*  --  0.16*  CREATININE 7.31* 7.20*  --   --   TROPONINIHS  --  83* 87*  --     Estimated Creatinine Clearance: 8 mL/min (A) (by C-G formula based on SCr of 7.2 mg/dL (H)).  Medications:  Eliquis 2.5 mg BID PTA  Assessment: 1 YOF presents to ED critically ill due to MSSA bacteremia and septic shock. Patient has had hx of VTE (05/01/21) and was on Eliquis 2.5 mg PO BID prior to admission. Last dose unknown but within past week per patient.   Baseline aPTT was 24.  Initial Heparin level low at 0.16 (aPTT 41) and Heparin was increased to 1150 units/hr.  Repeat Heparin level remains low at 0.16 (aPTT 45) - both appear to be correlating and heparin levels appear to be unaffected by recent Eliquis so will follow heparin levels going forward. No bleeding noted. HD cath removed with concern as source of infection. Plan to give 24 hour holiday and replace.   Goal of Therapy:  Heparin level 0.3-0.7 units/ml aPTT 66-102 seconds Monitor platelets by anticoagulation protocol: Yes   Plan:  Increase heparin to 1400 units/hr Repeat heparin level and CBC in 8 hours.  Daily heparin level and CBC while on therapy.   Sloan Leiter, PharmD, BCPS, BCCCP Clinical Pharmacist Please refer to [facility] for [place] numbers 07/17/2021, 3:19 PM

## 2021-07-17 NOTE — Progress Notes (Signed)
  Echocardiogram 2D Echocardiogram has been performed.  Whitney Walker 07/17/2021, 1:26 PM

## 2021-07-17 NOTE — Consult Note (Signed)
Date of Admission:  07/16/2021          Reason for Consult: Staphylococcus aureus bacteremia     Referring Provider: Automatic ID consult given MSSA bacteremia    Assessment:  MSSA bacteremia: Patient presented to the ED in septic shock briefly requiring pressor support with norepinephrine. She was hypotensive and had leukocytosis up to 26. Blood cultures 1 out of 4 were positive for MSSA. UA/Ucx were not obtained given oliguria. CT C/A/P was unrevealing for possible source of infection. Most likely source of bacteremia is her R tunneled line through which she receives HD. She was recently hospitalized for bilateral lower extremity rash and swab of R tunneled line grew MSSA. [name] line was not exchanged at that time. Patient also with possible seeding given acute worsening of her lower back pain. An MRI of the lumbar spine to further evaluate this pain is currently pending. Thus far patient has received vancomycin, cefepime, flagyl and been transitioned to ANCEF.  ESRD: Patient currently dialyzed via R tunneled line for the past year. She has had two LUE fistulas and states she will follow up with vascular outpatient.   Plan:  MSSA bacteremia:  Repeat blood cultures ordered. Follow up lumbar MRI and TTE. She will dialyze via her R tunneled line in the AM and agree with removal and line holiday for 2-3 days. Hold on TEE for now unless patient persistently bacteremic. Continue cefazolin for now. Duration will depend on MRI results. If osteomyelitis is noted then will continue for 6 weeks if uncomplicated will continue for 2 weeks after negative blood cultures.  ESRD: HD per nephrology. Line holiday for 2-3 days.    Active Problems:   ESRD (end stage renal disease) ([facility])   Septic shock (HCC)   Scheduled Meds:  (feeding supplement) PROSource Plus  30 mL Oral BID BM   aspirin  150 mg Rectal Daily   Chlorhexidine Gluconate Cloth  6 each Topical Q0600   insulin aspart  0-6 Units  Subcutaneous Q4H   lanthanum  1,000 mg Oral TID WC   Continuous Infusions:  sodium chloride     [START ON 07/18/2021]  ceFAZolin (ANCEF) IV     famotidine (PEPCID) IV Stopped (07/16/21 2227)   heparin 1,150 Units/hr (07/17/21 0705)   norepinephrine (LEVOPHED) Adult infusion 8 mcg/min (07/17/21 0705)   PRN Meds:.docusate sodium, fentaNYL (SUBLIMAZE) injection, polyethylene glycol  HPI: Whitney Walker is a 65 y.o. female ESRD (HD MWF via R tunneled catheter), HTN, DVT/PE on chronic AC w/ eliquis, PAD, CAD, COPD, colon cancer s/p R hemicolectomy. Patient presents to the hospital after acute worsening of her back pain.   Patient states that her back pain first began a few weeks ago. It was gradual in onset and has gotten progressively worse. She does not recall any inciting trauma and describes the pain as a diffuse, achy pain. She attended her hemodialysis session as scheduled the day prior to admission and she stated her back pain was at baseline. However after finishing her HD session she feels the pain was more intense and it would not subside prompting her to call EMS. She states she has a recent low grade fever (100F), nausea, vomiting, and diarrhea. She also reports polyarthritic pain in her fingers and bilateral knees that preceded her back pain. She was recently hospitalized ~1.7months ago for BLE rash and acute joint pain (including fingers, ankle, shoulder, and knees). ID was consulted and the source of her joint pain was  thought to be reactive 2/2 an infection as she had purulent drainage around her R sided tunneled catheter that grew MSSA or rheumatologic in etiology as her RF was + at 20.8. She was treated with vancomycin (2 week course) and steroids and noted improvement in her symptoms. Her line however does not appear to have been removed or exchanged at that time. She has had some acute LUE numbness and tingling but no weakness.   She denies any lower extremity weakness or  paresthesias, changes in vision, eye pain, muscle pain, or joint pain.   Patient was afebrile, hypotensive and had a leukocytosis. A CT C/A/P was unrevealing for infectious source. Urinalysis was not collected as patient is oliguric. She had Bcx with 1/4 showing MSSA. MRI of the L spine was pending. She briefly required levophed was started on vancomycin, flagyl, and cefepime and eventually narrowed to Ancef.   ID was consulted for further management of patient's MSSA bacteremia.  Review of Systems: Review of Systems  Constitutional:  Positive for fever. Negative for chills.  Eyes:  Negative for blurred vision, double vision, photophobia, pain, discharge and redness.  Respiratory:  Negative for cough and wheezing.   Cardiovascular:  Positive for chest pain. Negative for leg swelling.  Gastrointestinal:  Positive for diarrhea, nausea and vomiting.  Genitourinary:  Negative for dysuria, flank pain and urgency.  Musculoskeletal:  Positive for back pain and joint pain. Negative for myalgias and neck pain.  Skin:  Positive for rash. Negative for itching.  Neurological:  Positive for tingling. Negative for focal weakness, weakness and headaches.   Past Medical History:  Diagnosis Date   Anemia    Anxiety    CHF (congestive heart failure) (HCC)    Colon cancer (HCC)    treatment surgery   Complication of anesthesia    after first C- [place] "couldnt walk after", patient denies having a spinal   COPD (chronic obstructive pulmonary disease) ([facility])    Coronary artery disease    Depression    DVT (deep venous thrombosis) (HCC)    ESRD (end stage renal disease) ([facility])    Hemo: MWF   History of blood transfusion 04/2018   Hypertension    07/07/18- no longer takes BP medications   Meningitis    Pain in limb 07/30/2013   PE (pulmonary embolism)    Peripheral vascular disease (HCC)    Restless legs    Shortness of breath    with exertion   Sleep apnea    SOB (shortness of breath) 03/03/2019    Vertigo     Social History   Tobacco Use   Smoking status: Every Day    Packs/day: 0.50    Years: 50.00    Pack years: 25.00    Types: Cigarettes    Last attempt to quit: 02/03/2021    Years since quitting: 0.4   Smokeless tobacco: Former  Scientific laboratory technician Use: Never used  Substance Use Topics   Alcohol use: No    Alcohol/week: 0.0 standard drinks    Comment: she used to drink alcohol, quit in 2010; h/o heavy use    Drug use: Yes    Types: Oxycodone    Family History  Problem Relation Age of Onset   Cancer Mother 61       breast and bone   Cancer Father 51       prostate   Hypertension Sister    Bleeding Disorder Sister    Cancer Cousin 73  breast cancer    Hypertension Daughter    Breast cancer Neg Hx    Allergies  Allergen Reactions   Carnosine     Other reaction(s): Unknown   Gadolinium Derivatives Hives and Other (See Comments)    HIVES, Desc: HIVES W/ "DYE" USED FOR 1ST CT SCAN BUT NOT 2ND, NO PREMEDS USED, PT UNCERTAIN OF CIRCUMSTANCES,,?POSSIBLE MRI CONTRAST ALLERGY, ALL STUDIES DONE "SOMEWHERE" IN [place]//A.C., Onset Date: [id number]   Iohexol Other (See Comments)     Code: HIVES, Desc: HIVES W/ "DYE" USED FOR 1ST CT SCAN BUT NOT 2ND, NO PREMEDS USED, PT UNCERTAIN OF CIRCUMSTANCES,,?POSSIBLE MRI CONTRAST ALLERGY, ALL STUDIES DONE "SOMEWHERE" IN [place]//A.C., Onset Date: [id number]    Iodine Hives   Naltrexone     Other reaction(s): Unknown    OBJECTIVE: Blood pressure (!) 137/58, pulse 87, temperature 97.9 F (36.6 C), temperature source Oral, resp. rate (!) 28, height 5\' 1"  (1.549 m), weight 94 kg, SpO2 97 %.  Physical Exam Constitutional:      Appearance: Normal appearance. She is obese.  Eyes:     General:        Right eye: No discharge.        Left eye: No discharge.     Conjunctiva/sclera: Conjunctivae normal.  Cardiovascular:     Rate and Rhythm: Normal rate and regular rhythm.     Pulses: Normal pulses.  Pulmonary:      Effort: Pulmonary effort is normal.     Breath sounds: Normal breath sounds.  Abdominal:     General: There is no distension.     Palpations: Abdomen is soft.     Tenderness: There is no abdominal tenderness. There is no guarding or rebound.  Musculoskeletal:        General: No swelling or tenderness.     Right lower leg: No edema.     Left lower leg: No edema.  Skin:    General: Skin is warm and dry.     Findings: Rash present.  Neurological:     Mental Status: She is alert and oriented to person, place, and time.  Rash present on bilateral lower extremities L>R. Improved from last admission.  Midline TTP at L4.  R sided tunneled line is non erythematous, no purulent drainage, non indurated   Lab Results Lab Results  Component Value Date   WBC 29.1 (H) 07/17/2021   HGB 10.7 (L) 07/17/2021   HCT 34.8 (L) 07/17/2021   MCV 94.3 07/17/2021   PLT 146 (L) 07/17/2021    Lab Results  Component Value Date   CREATININE 7.20 (H) 07/17/2021   BUN 30 (H) 07/17/2021   NA 131 (L) 07/17/2021   K 4.9 07/17/2021   CL 101 07/17/2021   CO2 15 (L) 07/17/2021    Lab Results  Component Value Date   ALT 21 07/16/2021   AST 39 07/16/2021   ALKPHOS 93 07/16/2021   BILITOT 1.2 07/16/2021     Microbiology: Recent Results (from the past 240 hour(s))  Blood Culture (routine x 2)     Status: None (Preliminary result)   Collection Time: 07/16/21  1:15 PM   Specimen: BLOOD  Result Value Ref Range Status   Specimen Description BLOOD BLOOD RIGHT FOREARM  Final   Special Requests   Final    BOTTLES DRAWN AEROBIC AND ANAEROBIC Blood Culture adequate volume   Culture   Final    NO GROWTH < 12 HOURS Performed at [facility], [address],  [place], [place] [address]    Report Status PENDING  Incomplete  Resp Panel by RT-PCR (Flu A&B, Covid) Nasopharyngeal Swab     Status: None   Collection Time: 07/16/21  1:17 PM   Specimen: Nasopharyngeal Swab; Nasopharyngeal(NP) swabs in vial  transport medium  Result Value Ref Range Status   SARS Coronavirus 2 by RT PCR NEGATIVE NEGATIVE Final    Comment: (NOTE) SARS-CoV-2 target nucleic acids are NOT DETECTED.  The SARS-CoV-2 RNA is generally detectable in upper respiratory specimens during the acute phase of infection. The lowest concentration of SARS-CoV-2 viral copies this assay can detect is 138 copies/mL. A negative result does not preclude SARS-Cov-2 infection and should not be used as the sole basis for treatment or other patient management decisions. A negative result may occur with  improper specimen collection/handling, submission of specimen other than nasopharyngeal swab, presence of viral mutation(s) within the areas targeted by this assay, and inadequate number of viral copies(<138 copies/mL). A negative result must be combined with clinical observations, patient history, and epidemiological information. The expected result is Negative.  Fact Sheet for Patients:  EntrepreneurPulse.com.au  Fact Sheet for Healthcare Providers:  IncredibleEmployment.be  This test is no t yet approved or cleared by the [place] FDA and  has been authorized for detection and/or diagnosis of SARS-CoV-2 by FDA under an Emergency Use Authorization (EUA). This EUA will remain  in effect (meaning this test can be used) for the duration of the COVID-19 declaration under Section 564(b)(1) of the Act, 21 U.S.C.section 360bbb-3(b)(1), unless the authorization is terminated  or revoked sooner.       Influenza A by PCR NEGATIVE NEGATIVE Final   Influenza B by PCR NEGATIVE NEGATIVE Final    Comment: (NOTE) The Xpert Xpress SARS-CoV-2/FLU/RSV plus assay is intended as an aid in the diagnosis of influenza from Nasopharyngeal swab specimens and should not be used as a sole basis for treatment. Nasal washings and aspirates are unacceptable for Xpert Xpress SARS-CoV-2/FLU/RSV testing.  Fact  Sheet for Patients: EntrepreneurPulse.com.au  Fact Sheet for Healthcare Providers: IncredibleEmployment.be  This test is not yet approved or cleared by the [place] FDA and has been authorized for detection and/or diagnosis of SARS-CoV-2 by FDA under an Emergency Use Authorization (EUA). This EUA will remain in effect (meaning this test can be used) for the duration of the COVID-19 declaration under Section 564(b)(1) of the Act, 21 U.S.C. section 360bbb-3(b)(1), unless the authorization is terminated or revoked.  Performed at [facility], [place] [address] [place], [place] [address]   Blood Culture (routine x 2)     Status: None (Preliminary result)   Collection Time: 07/16/21  2:04 PM   Specimen: BLOOD RIGHT WRIST  Result Value Ref Range Status   Specimen Description BLOOD RIGHT WRIST  Final   Special Requests   Final    BOTTLES DRAWN AEROBIC AND ANAEROBIC Blood Culture results may not be optimal due to an inadequate volume of blood received in culture bottles   Culture  Setup Time   Final    GRAM POSITIVE COCCI IN CLUSTERS IN BOTH AEROBIC AND ANAEROBIC BOTTLES Organism ID to follow CRITICAL RESULT CALLED TO, READ BACK BY AND VERIFIED WITH: C. AMEND PHARMD, AT 2202 07/17/21 [place] Performed at [facility], [place] [address] [place], [place] [address]    Culture [facility] POSITIVE COCCI  Final   Report Status PENDING  Incomplete  Blood Culture ID Panel (Reflexed)     Status: Abnormal  Collection Time: 07/16/21  2:04 PM  Result Value Ref Range Status   Enterococcus faecalis NOT DETECTED NOT DETECTED Final   Enterococcus Faecium NOT DETECTED NOT DETECTED Final   Listeria monocytogenes NOT DETECTED NOT DETECTED Final   Staphylococcus species DETECTED (A) NOT DETECTED Final    Comment: CRITICAL RESULT CALLED TO, READ BACK BY AND VERIFIED WITH: C. AMEND PHARMD, AT 1610 07/17/21 D. VANHOOK    Staphylococcus aureus (BCID) DETECTED  (A) NOT DETECTED Final    Comment: CRITICAL RESULT CALLED TO, READ BACK BY AND VERIFIED WITH: C. AMEND PHARMD, AT 9604 07/17/21 D. VANHOOK    Staphylococcus epidermidis NOT DETECTED NOT DETECTED Final   Staphylococcus lugdunensis NOT DETECTED NOT DETECTED Final   Streptococcus species NOT DETECTED NOT DETECTED Final   Streptococcus agalactiae NOT DETECTED NOT DETECTED Final   Streptococcus pneumoniae NOT DETECTED NOT DETECTED Final   Streptococcus pyogenes NOT DETECTED NOT DETECTED Final   A.calcoaceticus-baumannii NOT DETECTED NOT DETECTED Final   Bacteroides fragilis NOT DETECTED NOT DETECTED Final   Enterobacterales NOT DETECTED NOT DETECTED Final   Enterobacter cloacae complex NOT DETECTED NOT DETECTED Final   Escherichia coli NOT DETECTED NOT DETECTED Final   Klebsiella aerogenes NOT DETECTED NOT DETECTED Final   Klebsiella oxytoca NOT DETECTED NOT DETECTED Final   Klebsiella pneumoniae NOT DETECTED NOT DETECTED Final   Proteus species NOT DETECTED NOT DETECTED Final   Salmonella species NOT DETECTED NOT DETECTED Final   Serratia marcescens NOT DETECTED NOT DETECTED Final   Haemophilus influenzae NOT DETECTED NOT DETECTED Final   Neisseria meningitidis NOT DETECTED NOT DETECTED Final   Pseudomonas aeruginosa NOT DETECTED NOT DETECTED Final   Stenotrophomonas maltophilia NOT DETECTED NOT DETECTED Final   Candida albicans NOT DETECTED NOT DETECTED Final   Candida auris NOT DETECTED NOT DETECTED Final   Candida glabrata NOT DETECTED NOT DETECTED Final   Candida krusei NOT DETECTED NOT DETECTED Final   Candida parapsilosis NOT DETECTED NOT DETECTED Final   Candida tropicalis NOT DETECTED NOT DETECTED Final   Cryptococcus neoformans/gattii NOT DETECTED NOT DETECTED Final   Meth resistant mecA/C and MREJ NOT DETECTED NOT DETECTED Final    Comment: Performed at [facility], [address] [place], [place] [address]  C Difficile Quick Screen w PCR reflex     Status: None    Collection Time: 07/16/21  3:08 PM   Specimen: STOOL  Result Value Ref Range Status   C Diff antigen NEGATIVE NEGATIVE Final   C Diff toxin NEGATIVE NEGATIVE Final   C Diff interpretation No C. difficile detected.  Final    Comment: Performed at [facility], [place] [address], [place], [place] [address]    Althea Grimmer, [place] [phone] pager  07/17/2021, 10:55 AM         [facility] Staphylococcus aureus bacteremia   Staphylococcus aureus bacteremia (SAB) is associated with a high rate of complications and mortality.  Specific aspects of clinical management are critical to optimizing the outcome of patients with SAB.  Therefore, the [facility] Antimicrobial Management Team [facility]) has initiated an intervention aimed at improving the management of SAB at [facility].  To do so, Infectious Diseases physicians are providing an evidence-based consult for the management of all patients with SAB.     Yes No Comments  Perform follow-up blood cultures (even if the patient is afebrile) to ensure clearance of bacteremia [x]  []   Remove vascular catheter and obtain follow-up blood cultures after the removal of the catheter [x]  []    Perform echocardiography to evaluate for endocarditis (transthoracic ECHO is 40-50% sensitive, TEE is > 90% sensitive) [x]  []  Please keep in mind, that neither test can definitively EXCLUDE endocarditis, and that should clinical suspicion remain high for endocarditis the patient should then still be treated with an "endocarditis" duration of therapy = 6 weeks  Consult electrophysiologist to evaluate implanted cardiac device (pacemaker, ICD) []  [x]    Ensure source control []  [x]  Have all abscesses been drained effectively? Have deep seeded infections (septic joints or osteomyelitis) had appropriate surgical debridement?  Investigate for "metastatic" sites of infection  [x]  []  Does the patient have ANY symptom or physical exam finding that would suggest a deeper infection (back or neck pain that may be suggestive of vertebral osteomyelitis or epidural abscess, muscle pain that could be a symptom of pyomyositis)?  Keep in mind that for deep seeded infections MRI imaging with contrast is preferred rather than other often insensitive tests such as plain x-rays, especially early in a patient's presentation.  Change antibiotic therapy to __________________ []  [x]  Beta-lactam antibiotics are preferred for MSSA due to higher cure rates.   If on Vancomycin, goal trough should be 15 - 20 mcg/mL  Estimated duration of IV antibiotic therapy:   [x]  []  Consult case management for probably prolonged outpatient IV antibiotic therapy

## 2021-07-17 NOTE — Consult Note (Addendum)
[facility] Renal Consultation Note    Indication for Consultation:  Management of ESRD/hemodialysis, anemia, hypertension/volume, and secondary hyperparathyroidism. PCP:  HPI: Samyuktha Brau is a 65 y.o. female with ESRD, HTN, T2DM, COPD/Hx tobacco use, Hx DVT/PE who was admitted with sepsis.  Presented to ED after dialysis yesterday (7/18) with bilateral flank pain and weakness for few weeks, worsening over time. She had 1 day low grade fever ~100F on 7/17, has chronic intermittent chills. No N/V/D. She has chronic dyspnea and intermittent CP, which was recently evaluated with [facility] in 04/2021 which showed only minimal blockages (<30%) but with coronary calcification. She reports that has been hypotensive with HD recently as well.  In the ED, she was hypotensive and tachycardic with T100.1 -> sepsis protocol activated, IVF given, required pressors for hypotension. Blood Cx drawn and started on Vanc/Cefepime/Flagyl. Labs showed Na 132, K 4.7, Ca 8.2, WBC 26.9 -> 29.1, LA 3.3 -> 1.7, Hgb 11.9. CXR with "streaky bibasilar opacities." Chest CT without PE, but indeterminate renal lesions -> followed by abdominal US which was grossly unremarkable. Today, 1 of 2 Bcx + for MSSA. Cultures were repeated and abx narrowed to Cefazolin.  Today, back pain is a little better but she has not been out of bed yet. No CP or dyspnea at the moment, had CP overnight and treated with aspirin. Trop 83 -> 87. No N/V/D or fever today. Using nasal O2 and remains on pressors, but with deescalating dose.  Dialyzes on MWF schedule at [facility]. She is typically compliant with her treatments and medications. She has however, refused to move towards usable permanent access (failed AVF in past) and continues to use [facility] as her HD access and understands the infection risk.  Past Medical History:  Diagnosis Date   Anemia    Anxiety    CHF (congestive heart failure) ([place])    Colon cancer ([place])    treatment surgery    Complication of anesthesia    after first C- [place] "couldnt walk after", patient denies having a spinal   COPD (chronic obstructive pulmonary disease) ([place])    Coronary artery disease    Depression    DVT (deep venous thrombosis) ([facility])    ESRD (end stage renal disease) ([place])    Hemo: MWF   History of blood transfusion 04/2018   Hypertension    07/07/18- no longer takes BP medications   Meningitis    Pain in limb 07/30/2013   PE (pulmonary embolism)    Peripheral vascular disease ([facility])    Restless legs    Shortness of breath    with exertion   Sleep apnea    SOB (shortness of breath) 03/03/2019   Vertigo    Past Surgical History:  Procedure Laterality Date   ABDOMINAL AORTAGRAM N/A 07/26/2013   Procedure: ABDOMINAL Maxcine Ham;  Surgeon: [name] [place], MD;  Location: [facility] CATH LAB;  Service: Cardiovascular;  Laterality: N/A;   ABDOMINAL HYSTERECTOMY     AV FISTULA PLACEMENT Left 05/05/2018   Procedure: ARTERIOVENOUS (AV) FISTULA CREATION BRACHIOCEPHALIC;  Surgeon: Waynetta Sandy, MD;  Location: [place];  Service: Vascular;  Laterality: Left;   AV FISTULA PLACEMENT Left 07/16/2018   Procedure: ARTERIOVENOUS FISTULA CREATION;  Surgeon: Waynetta Sandy, MD;  Location: [place];  Service: Vascular;  Laterality: Left;   [place] Left 09/03/2018   Procedure: BASILIC VEIN TRANSPOSITION SECOND STAGE;  Surgeon: Waynetta Sandy, MD;  Location: [place];  Service: Vascular;  Laterality: Left;  BREAST BIOPSY Left    CESAREAN SECTION     X 3 1974-1977   CHOLECYSTECTOMY     CHOLECYSTECTOMY  1980/s   COLECTOMY  2010   DIVERTICULOSIS SURGERY-2002  2012   FEMORAL-POPLITEAL BYPASS GRAFT Left 08/13/2013   Procedure: BYPASS GRAFT FEMORAL-POPLITEAL ARTERY WITH NON-REVERSED SAPHANEOUS VEIN; ULTRASOUND GUIDED;  Surgeon: Mal [name], MD;  Location: [place];  Service: Vascular;  Laterality: Left;   INSERTION OF DIALYSIS CATHETER Right 04/30/2018   Procedure: INSERTION OF  Right Internal Jugular DIALYSIS CATHETER;  Surgeon: [name], MD;  Location: [facility] OR;  Service: Vascular;  Laterality: Right;   IR FLUORO GUIDE CV LINE RIGHT  07/24/2020   IR REMOVAL TUN CV CATH W/O FL  07/21/2020   IR US GUIDE VASC ACCESS RIGHT  07/24/2020   LEFT HEART CATH AND CORONARY ANGIOGRAPHY N/A 05/01/2021   Procedure: LEFT HEART CATH AND CORONARY ANGIOGRAPHY;  Surgeon: [name], MD;  Location: [place] CV LAB;  Service: Cardiovascular;  Laterality: N/A;   LOWER EXTREMITY ANGIOGRAM Left 07/26/2013   Procedure: LOWER EXTREMITY ANGIOGRAM;  Surgeon: [name] [place], MD;  Location: [facility] CATH LAB;  Service: Cardiovascular;  Laterality: Left;   Family History  Problem Relation Age of Onset   Cancer Mother 39       breast and bone   Cancer Father 29       prostate   Hypertension Sister    Bleeding Disorder Sister    Cancer Cousin 80       breast cancer    Hypertension Daughter    Breast cancer Neg Hx    Social History:  reports that she has been smoking cigarettes. She has a 25.00 pack-year smoking history. She has quit using smokeless tobacco. She reports current drug use. Drug: Oxycodone. She reports that she does not drink alcohol.  ROS: As per HPI otherwise negative.  Physical Exam: Vitals:   07/17/21 0845 07/17/21 0900 07/17/21 0915 07/17/21 0930  BP: (!) 141/73 (!) 142/89 (!) 149/69 (!) 137/58  Pulse: 89 89 85 87  Resp: 11 (!) 25 20 (!) 28  Temp:      TempSrc:      SpO2: 96% 96% 94% 97%  Weight:      Height:         General: Well developed, well nourished, in no acute distress. On nasal O2. Head: Normocephalic, atraumatic, sclera non-icteric, mucus membranes are moist. Neck: Supple without lymphadenopathy/masses. JVD not elevated. Lungs: Clear bilaterally to auscultation without wheezes, rales, or rhonchi.  Heart: RRR with normal S1, S2. No murmurs, rubs, or gallops appreciated. Abdomen: Soft, non-tender, non-distended with normoactive bowel sounds. No  rebound/guarding. Musculoskeletal:  Strength and tone appear normal for age. Lower extremities: Trace BLE edema, no ischemic changes, no open wounds. Neuro: Alert and oriented X 3. Moves all extremities spontaneously. Psych:  Responds to questions appropriately with a normal affect. Dialysis Access: TDC in R chest - mild surrounding contact derm rash from tape. Prior LUE AVF -- no bruit on exam, likely thrombosed.  Allergies  Allergen Reactions   Carnosine     Other reaction(s): Unknown   Gadolinium Derivatives Hives and Other (See Comments)    HIVES, Desc: HIVES W/ "DYE" USED FOR 1ST CT SCAN BUT NOT 2ND, NO PREMEDS USED, PT UNCERTAIN OF CIRCUMSTANCES,,?POSSIBLE MRI CONTRAST ALLERGY, ALL STUDIES DONE "SOMEWHERE" IN [place]//A.C., Onset Date: [id number]   Iohexol Other (See Comments)     Code: HIVES, Desc: HIVES W/ "DYE" USED FOR 1ST  CT SCAN BUT NOT 2ND, NO PREMEDS USED, PT UNCERTAIN OF CIRCUMSTANCES,,?POSSIBLE MRI CONTRAST ALLERGY, ALL STUDIES DONE "SOMEWHERE" IN [place]//A.C., Onset Date: 16073710    Iodine Hives   Naltrexone     Other reaction(s): Unknown   Prior to Admission medications   Medication Sig Start Date End Date Taking? Authorizing Provider  acetaminophen (TYLENOL) 500 MG tablet Take 1,000 mg by mouth 2 (two) times daily as needed for moderate pain or headache.    [provider]  albuterol (VENTOLIN HFA) 108 (90 Base) MCG/ACT inhaler Inhale 2 puffs into the lungs every 6 (six) hours as needed for wheezing or shortness of breath. 08/03/20   [name], FNP  amLODipine (NORVASC) 5 MG tablet Take 1 tablet (5 mg total) by mouth daily. 06/07/21 06/07/22  [name], FNP  apixaban (ELIQUIS) 2.5 MG TABS tablet Take 1 tablet (2.5 mg total) by mouth 2 (two) times daily. 06/13/21   Truitt Merle, MD  Ascorbic Acid (VITAMIN C) 100 MG tablet Take 100 mg by mouth daily.    [provider]  atorvastatin (LIPITOR) 10 MG tablet Take 1 tablet (10 mg total) by mouth daily.  06/05/21   Cantwell, Celeste C, PA-C  AURYXIA 1 GM 210 MG(Fe) tablet Take 420-840 mg by mouth with breakfast, with lunch, and with evening meal. Taking  2 tablets daily (420mg ) with each snack 05/15/21   [provider]  Biotin 10000 MCG TABS Take 10,000 mcg by mouth daily.     [provider]  carvedilol (COREG) 12.5 MG tablet Take 12.5 mg by mouth 2 (two) times daily with a meal.     [provider]  lanthanum (FOSRENOL) 1000 MG chewable tablet Chew 1,000 mg by mouth See admin instructions. Take 1000 mg with each meal and each snack    [provider]  meclizine (ANTIVERT) 12.5 MG tablet Take 1 tablet (12.5 mg total) by mouth 3 (three) times daily as needed for dizziness. 04/12/21   Glendale Chard, MD  multivitamin (RENA-VIT) TABS tablet Take 1 tablet by mouth at bedtime.    Edrick Oh, MD  mupirocin ointment (BACTROBAN) 2 % Apply 1 application topically daily as needed (skin bumps). 04/15/19   [name], FNP  nystatin (NYSTATIN) powder Apply 1 application topically 4 (four) times daily as needed. Patient taking differently: Apply 1 application topically 4 (four) times daily as needed (irriation). 08/03/20   [name], FNP  omeprazole (PRILOSEC) 20 MG capsule Take 1 capsule (20 mg total) by mouth daily. 05/17/21   Truitt Merle, MD  predniSONE (DELTASONE) 10 MG tablet Take 4 tablets (40 mg) daily for 6 days, then, Take 3 tablets (30 mg) daily for 6 days, then, Take 2 tablets (20 mg) daily for 6 days, then, Take 1 tablets (10 mg) daily for 2 days, then stop 05/31/21   Jonetta Osgood, MD  vitamin E 200 UNIT capsule Take 200 Units by mouth daily.    [provider]   Current Facility-Administered Medications  Medication Dose Route Frequency Provider Last Rate Last Admin   0.9 %  sodium chloride infusion  250 mL Intravenous Continuous Desai, Rahul P, PA-C       aspirin suppository 150 mg  150 mg Rectal Daily Anders Simmonds, MD   150 mg at 07/17/21 0106    [START ON 07/18/2021] ceFAZolin (ANCEF) IVPB 1 g/50 mL premix  1 g Intravenous Q24H Millen, Jessica B, RPH       ceFAZolin (ANCEF) IVPB 2g/100 mL premix  2 g Intravenous Once Sloan Leiter B, RPH 200 mL/hr at 07/17/21 0941 2 g at 07/17/21 0941   Chlorhexidine Gluconate Cloth 2 % PADS 6 each  6 each Topical Q0600 Kipp Brood, MD   6 each at 07/17/21 0612   docusate sodium (COLACE) capsule 100 mg  100 mg Oral BID PRN Shearon Stalls, Rahul P, PA-C       famotidine (PEPCID) IVPB 20 mg premix  20 mg Intravenous Q24H Desai, Rahul P, PA-C   Stopped at 07/16/21 2227   fentaNYL (SUBLIMAZE) injection 25-50 mcg  25-50 mcg Intravenous Q4H PRN Anders Simmonds, MD   25 mcg at 07/17/21 0733   heparin ADULT infusion 100 units/mL (25000 units/225mL)  1,150 Units/hr Intravenous Continuous Erenest Blank, RPH 11.5 mL/hr at 07/17/21 0705 1,150 Units/hr at 07/17/21 0705   insulin aspart (novoLOG) injection 0-6 Units  0-6 Units Subcutaneous Q4H Anders Simmonds, MD       norepinephrine (LEVOPHED) 4mg  in 269mL premix infusion  2-10 mcg/min Intravenous Titrated Desai, Rahul P, PA-C 30 mL/hr at 07/17/21 0705 8 mcg/min at 07/17/21 0705   polyethylene glycol (MIRALAX / GLYCOLAX) packet 17 g  17 g Oral Daily PRN Germain Osgood, PA-C       Labs: Basic Metabolic Panel: Recent Labs  Lab 07/16/21 1402 07/17/21 0408  NA 132* 131*  K 4.8 4.9  CL 94* 101  CO2 19* 15*  GLUCOSE 99 131*  BUN 24* 30*  CREATININE 7.31* 7.20*  CALCIUM 8.2* 7.3*  PHOS  --  6.5*   Liver Function Tests: Recent Labs  Lab 07/16/21 1402  AST 39  ALT 21  ALKPHOS 93  BILITOT 1.2  PROT 7.8  ALBUMIN 2.7*   CBC: Recent Labs  Lab 07/16/21 1402 07/17/21 0408  WBC 26.9* 29.1*  NEUTROABS 23.8*  --   HGB 11.9* 10.7*  HCT 37.8 34.8*  MCV 92.2 94.3  PLT 143* 146*   CBG: Recent Labs  Lab 07/16/21 1925 07/16/21 2159 07/16/21 2336 07/17/21 0322 07/17/21 0734  GLUCAP 138* 142* 122* 116* 136*   Studies/Results: US Abdomen  Complete  Result Date: 07/17/2021 CLINICAL DATA:  Septic shock EXAM: ABDOMEN ULTRASOUND COMPLETE COMPARISON:  CT 07/16/2021 FINDINGS: Gallbladder: Absent Common bile duct: Diameter: 5 mm in proximal diameter Liver: No focal lesion identified. Within normal limits in parenchymal echogenicity. Portal vein is patent on color Doppler imaging with normal direction of blood flow towards the liver. IVC: No abnormality visualized. Pancreas: Visualized portion unremarkable. Spleen: Size and appearance within normal limits. Right Kidney: Length: 7.8 cm. Renal cortical echogenicity is within normal limits. Mild to moderate renal cortical atrophy is noted. The 2 cortical lesions in question noted on prior CT examination represents simple cysts measuring 2.9 and 2.5 cm in greatest dimension. No solid intrarenal masses are seen. No intrarenal calcifications are seen. There is no hydronephrosis. Left Kidney: Length: 7.7 cm. Renal cortical echogenicity is within normal limits. Mild to moderate cortical atrophy is noted. No hydronephrosis. No intrarenal masses or calcifications are seen. Abdominal aorta: No aneurysm visualized. Other findings: None. IMPRESSION: Status post cholecystectomy. Mild to moderate bilateral renal cortical atrophy. Indeterminate cortical lesions noted within the right kidney on prior CT examination represents simple cortical cyst. Further follow-up is not required. Electronically Signed   By: Fidela Salisbury MD   On: 07/17/2021 03:09   DG Chest Port 1 View  Result Date: 07/17/2021 CLINICAL DATA:  Respiratory failure. EXAM: PORTABLE CHEST 1 VIEW COMPARISON:  CT 07/16/2021.  Chest  x-ray 07/16/2021. FINDINGS: Dialysis catheter stable position. Cardiomegaly with pulmonary venous congestion noted. Low lung volumes with mild bibasilar atelectasis. No focal infiltrate or edema. No prominent pleural effusion. No pneumothorax. Reference is made to recent chest CT report for discussion of pulmonary nodules  present. No acute bony abnormality identified. IMPRESSION: 1.  Dialysis catheter stable position. 2. Cardiomegaly with pulmonary venous congestion. Low lung volumes with mild bibasilar atelectasis. No focal infiltrate or edema. Electronically Signed   By: Marcello Moores  Register   On: 07/17/2021 06:26   DG Chest Port 1 View  Result Date: 07/16/2021 CLINICAL DATA:  Questionable sepsis EXAM: PORTABLE CHEST 1 VIEW COMPARISON:  May 28, 2021 FINDINGS: Right internal jugular dual lumen tunneled central venous catheter in stable position. The heart size and mediastinal contours are within normal limits. Streaky bibasilar opacities. No visible pleural effusion or pneumothorax. The visualized skeletal structures are unremarkable. IMPRESSION: Streaky bibasilar opacities, favor atelectasis although developing infection would not be excluded. Electronically Signed   By: Dahlia Bailiff MD   On: 07/16/2021 13:59   CT CHEST ABDOMEN PELVIS WO CONTRAST  Result Date: 07/16/2021 CLINICAL DATA:  Respiratory illness, nondiagnostic xray. Respiratory illness/flank pain EXAM: CT CHEST, ABDOMEN AND PELVIS WITHOUT CONTRAST TECHNIQUE: Multidetector CT imaging of the chest, abdomen and pelvis was performed following the standard protocol without IV contrast. COMPARISON:  CT chest 02/06/2021, CT cardiac 02/27/2021, CT abdomen pelvis 05/18/2012 FINDINGS: CT CHEST FINDINGS Cardiovascular: Normal heart size. No significant pericardial effusion. The thoracic aorta is normal in caliber. At least mild atherosclerotic plaque of the thoracic aorta. Four-vessel coronary artery calcifications. Mitral annular calcifications. The main pulmonary artery enlarged in caliber. Mediastinum/Nodes: No gross hilar adenopathy, noting limited sensitivity for the detection of hilar adenopathy on this noncontrast study. No enlarged mediastinal or axillary lymph nodes. Thyroid gland, trachea, and esophagus demonstrate no significant findings. Possibly tiny hiatal  hernia. Lungs/Pleura: Bilateral lower lobe subsegmental atelectasis. Slightly more conspicuous but grossly stable in size triangular subpleural 6 mm pulmonary nodule within the right upper lobe likely representing an intrapulmonary lymph node (4:59). Stable 5 mm left lower lobe pulmonary nodule (4:107). No new pulmonary nodule. No pulmonary mass. No focal consolidation. Musculoskeletal: No chest wall abnormality. No suspicious lytic or blastic osseous lesions. No acute displaced fracture. Multilevel degenerative changes of the spine. CT ABDOMEN PELVIS FINDINGS Hepatobiliary: No focal liver abnormality is seen. Status post cholecystectomy. No biliary dilatation. Pancreas: No focal lesion. Normal pancreatic contour. No surrounding inflammatory changes. No main pancreatic ductal dilatation. Spleen: Normal in size without focal abnormality. Several splenules noted within the left upper lobe with the largest 1 measuring up to 4 cm. Adrenals/Urinary Tract: No adrenal nodule bilaterally. Bilateral renal cortical scarring. No nephrolithiasis, no hydronephrosis. There is a 2.3 cm left renal fluid density lesion that likely represents a simple renal cyst. Slight interval increase in size of two low-density slightly heterogeneous right renal lesions measuring 2.7 and 2.3 cm demonstrating indeterminate densities of 22 and 26 Hounsfield units. Subcentimeter hypodensities are too small to characterize. Otherwise no contour-deforming renal mass. No ureterolithiasis or hydroureter. The urinary bladder is decompressed and grossly unremarkable. Stomach/Bowel: Partial colectomy stomach is within normal limits. No evidence of bowel wall thickening or dilatation. Vascular/Lymphatic: No abdominal aorta or iliac aneurysm. Severe atherosclerotic plaque of the aorta and its branches. No abdominal, pelvic, or inguinal lymphadenopathy. Reproductive: Status post hysterectomy. No adnexal masses. Other: No intraperitoneal free fluid. No  intraperitoneal free gas. No organized fluid collection. Musculoskeletal: Small fat containing umbilical hernia. Healed  anterior abdominal incision. No suspicious lytic or blastic osseous lesions. No acute displaced fracture. Multilevel degenerative changes of the spine. IMPRESSION: 1. Slightly more conspicuous but grossly stable in size 6 mm triangular right upper lobe subpleural nodule. Stable 5 mm left lower lobe pulmonary nodule. No new pulmonary nodule or mass. Given stability, recommend CT at 12-105months is considered optional for low-risk patients, but is recommended for high-risk patients. This recommendation follows the consensus statement: Guidelines for Management of Incidental Pulmonary Nodules Detected on CT Images: From the Fleischner Society 2017; Radiology 2017; 284:228-243. 2. Enlarged main pulmonary artery suggestive of pulmonary hypertension. 3. Interval increase in size of two indeterminate slightly heterogeneous 2.3cm and 2.7 cm right renal lesions. Recommend MRI renal protocol for further evaluation. 4. Aortic Atherosclerosis (ICD10-I70.0) including four-vessel coronary artery calcifications and mitral annular calcifications. 5. Please note limited evaluation due to noncontrast study. Electronically Signed   By: Iven Finn M.D.   On: 07/16/2021 16:50    Dialysis Orders:  MWF at [facility] 3:30hr, 400/A1.5, EDW 90.5kg, 2K/2.5Ca, UFP #2, TDC, heparin 6000 - Hectoral 64mcg IV q HD - No ESA, Hgb > 11  Assessment/Plan:  Sepsis/MSSA bacteremia (1 of 2 Blood Cx +): Repeat Cx pending, initially on Vanc/Cefepime/Flagyl -> now narrowed to Cefazolin. CXR clear. Remains on pressors and tapering. May need TDC exchange.  ESRD:  Continue HD per usual MWF schedule - > HD planned for tomorrow.  Hypotension/volume: On pressors at this time, home BP meds on hold. 4kg above dry weight, UF as tolerated with next HD.  Anemia: Hgb remains in goal, holding on ESA for now.  Metabolic bone disease:  CorrCa ok, Phos high - resume home binders.  Nutrition: Alb low, will add supplements.  Hx DVT/PE: Eliquis on hold, on hep drip for now.  Veneta Penton, PA-C 07/17/2021, 9:49 AM  [facility]  I have seen and examined this patient and agree with plan and assessment in the above note with renal recommendations/intervention highlighted. Pt with MSSA bacteremia and sepsis.  TTE negative for vegetations but now with back pain.  Will plan for HD tomorrow and then remove [facility] for line "holiday" and replace new TDC on Friday.  Would also need TEE to r/o SBE given h/o previous enterococcal bacteremia on 07/19/20 and had catheter removal and present HD catheter which was placed 07/24/20.   Governor Rooks Vannie Hilgert,MD 07/17/2021 2:57 PM

## 2021-07-17 NOTE — Plan of Care (Signed)
  Problem: Health Behavior/Discharge Planning: Goal: Ability to manage health-related needs will improve Outcome: Progressing   

## 2021-07-17 NOTE — Progress Notes (Addendum)
NAME:  Whitney Walker, MRN:  [id number], DOB:  November 24, 1956, LOS: 1 ADMISSION DATE:  07/16/2021, CONSULTATION DATE: 7/18 REFERRING MD:  pfeiffer, CHIEF COMPLAINT: septic shock  History of Present Illness:  20 yof w/ past medical hx as provided below; presented to the ED 7/18 w/ cc  gradual onset of constant diffuse achy low back pain which had progressed over several weeks, followed by new onset of nausea, vomiting and diarrhea with fever 100. She completed full HD session 7/18 and EMS called afterwards as pt reported worsening pain. Still makes urine though minimal and last time awas bout 3d PTA.  In ER: + fever 99.7 HR initially 90s bp 93/65, somewhat toxic appearing, WBC 26.9, Reporting she was tender to palpation of the bilateral paralumbar region w/ extension of this discomfort to the buttocks.  CT imaging of chest/abd/pelvis: RUL subpleural nodule. LLL pulm nodule. Enlarged Pulm arteries, two slightly heterogenous appearing renal lesions (unclear etiology), no lytic or osseous lesions, no abd pathology. No nephrolithiasis  Got more hypotensive while in ER. Latate 3.3 got 3 liters IVF. Cultures sent. Started on vanc and cefepime. PCCM asked to admit    Pertinent  Medical History  ESRD (MWF), CHF, COPD prior PE/DVT on DOAC. Anemia of chronic disease. Colon cancer. HTN, PVD RLS Sleep apnea   Significant Hospital Events: Including procedures, antibiotic start and stop dates in addition to other pertinent events   7/18 admitted w/ working dx sepsis. Source not clear but seemed [place] source.  Got IVFs 3 liters.  CT chest/abd/pelvis w/out clear source. Had two slightly heterogenous appearing renal lesions (unclear etiology. Lactate 3.3. started on Norepi. Cefepime and Vanc started.   Interim History / Subjective:  State she feels thirsty this morning and continue to have flank and back pain. No nausea or vomiting overnight. Notes sharp left sided non radiating chest pain overnight, pain is  constant and now pressure after receiving fentanyl.  Objective   Blood pressure (!) 142/82, pulse 92, temperature 97.9 F (36.6 C), temperature source Oral, resp. rate 17, height 5\' 1"  (1.549 m), weight 94 kg, SpO2 96 %.        Intake/Output Summary (Last 24 hours) at 07/17/2021 0844 Last data filed at 07/17/2021 0800 Gross per 24 hour  Intake 1590.02 ml  Output --  Net 1590.02 ml   Filed Weights   07/16/21 1504 07/16/21 1550 07/16/21 2000  Weight: 90.7 kg 90.7 kg 94 kg    Examination: General: Adult female, resting in bed, in NAD. Neuro: A&O x 3, no deficits. HEENT: [place]/AT. Sclerae anicteric. EOMI.  MM moist Cardiovascular: RRR, no M/R/G.  Lungs: Respirations even and unlabored.  CTA bilaterally, No W/R/R.  Abdomen: BS x 4, soft, nontender. Musculoskeletal: TTP of the left lower sternal border and bilateral flank and lower back, no edema.  Skin: Warm, dry, no rashes.  Resolved Hospital Problem list     Assessment & Plan:  Septic Shock secondary to MSSA bacteremia suspect from right [facility] for dialysis  Prior culture with MSSA in on 6/2 treated with 2 weeks of Vancomycin. Now with MSSA bacteremia on blood cultures 7/18 and persistent back pain - ID consulted, appreciate assistance, recommend dialysis catheter exchange  - Levophed as needed, goal MAP > 65, titrate down as tolerated - Start cefazolin, stop vancomycin, and cefepime - Follow up echocardiogram to evaluate for endocarditis  - MRI lumbar spine   ESRD on HD MWF - completed full session Mon 7/18. - Nephrology consulted, appreciate assistance -  HD per nephology - catheter holiday given MSSA bacteremia as above   Chest pain No acute ischemic EKG changes. troponin 83>87. Likely musculoskeletal - Fentanyl PRN for pain  Hx DVT / PE. - Heparin gtt in lieu of home Eliquis for now incase needs any procedures etc.   HTN, HLD, PVD, CAD (LHC from May 2022 with mod disease RCA and Cx, diffuse coronary calcifications,  minimal disease LAD, normal LVEF and LVEDP). - Hold home Amlodipine, Apixaban, Atorvastatin, Carvedilol.  RUL and LLL pulmonary nodules - stable. - F/u CT in 12 - 18 months.  Renal lesions on CT - US findings consistent with simple right  renal cysts  Best Practice (right click and "Reselect all SmartList Selections" daily)  Diet/type: Regular consistency (see orders) and NPO DVT prophylaxis: systemic heparin GI prophylaxis: H2B Lines: N/A Foley:  N/A Code Status:  full code Last date of multidisciplinary goals of care discussion: not yet.  Labs   CBC: Recent Labs  Lab 07/16/21 1402 07/17/21 0408  WBC 26.9* 29.1*  NEUTROABS 23.8*  --   HGB 11.9* 10.7*  HCT 37.8 34.8*  MCV 92.2 94.3  PLT 143* 146*    Basic Metabolic Panel: Recent Labs  Lab 07/16/21 1402 07/17/21 0408  NA 132* 131*  K 4.8 4.9  CL 94* 101  CO2 19* 15*  GLUCOSE 99 131*  BUN 24* 30*  CREATININE 7.31* 7.20*  CALCIUM 8.2* 7.3*  MG  --  1.7  PHOS  --  6.5*   GFR: Estimated Creatinine Clearance: 8 mL/min (A) (by C-G formula based on SCr of 7.2 mg/dL (H)). Recent Labs  Lab 07/16/21 1402 07/16/21 1510 07/17/21 0408  WBC 26.9*  --  29.1*  LATICACIDVEN 3.3* 1.7  --     Liver Function Tests: Recent Labs  Lab 07/16/21 1402  AST 39  ALT 21  ALKPHOS 93  BILITOT 1.2  PROT 7.8  ALBUMIN 2.7*   No results for input(s): LIPASE, AMYLASE in the last 168 hours. No results for input(s): AMMONIA in the last 168 hours.  ABG    Component Value Date/Time   HCO3 25.5 03/03/2019 0335   TCO2 26 03/03/2019 0335   ACIDBASEDEF 14.0 (H) 03/03/2019 0002   O2SAT 39.0 03/03/2019 0335     Coagulation Profile: Recent Labs  Lab 07/16/21 1402  INR 1.3*    Cardiac Enzymes: No results for input(s): CKTOTAL, CKMB, CKMBINDEX, TROPONINI in the last 168 hours.  HbA1C: Hgb A1c MFr Bld  Date/Time Value Ref Range Status  07/16/2021 02:02 PM 6.2 (H) 4.8 - 5.6 % Final    Comment:    (NOTE) Pre diabetes:           5.7%-6.4%  Diabetes:              >6.4%  Glycemic control for   <7.0% adults with diabetes   06/04/2018 12:00 AM 5.1 4.0 - 6.0 Final    CBG: Recent Labs  Lab 07/16/21 1925 07/16/21 2159 07/16/21 2336 07/17/21 0322 07/17/21 0734  GLUCAP 138* 142* 122* 116* 136*    Review of Systems:   All negative; except for those that are bolded, which indicate positives.  Constitutional: weight loss, weight gain, night sweats, fevers, chills, fatigue, weakness.  HEENT: headaches, sore throat, sneezing, nasal congestion, post nasal drip, difficulty swallowing, tooth/dental problems, visual complaints, visual changes, ear aches. Neuro: difficulty with speech, weakness, numbness, ataxia. CV:  chest pain, orthopnea, PND, swelling in lower extremities, dizziness, palpitations, syncope.  Resp: cough, hemoptysis,  dyspnea, wheezing. GI: heartburn, indigestion, abdominal pain, nausea, vomiting, diarrhea, constipation, change in bowel habits, loss of appetite, hematemesis, melena, hematochezia.  GU: dysuria, change in color of urine, urgency or frequency, flank pain, hematuria. MSK: joint pain or swelling, decreased range of motion. Psych: change in mood or affect, depression, anxiety, suicidal ideations, homicidal ideations. Skin: rash, itching, bruising.   Past Medical History:  She,  has a past medical history of Anemia, Anxiety, CHF (congestive heart failure) ([place]), Colon cancer ([place]), Complication of anesthesia, COPD (chronic obstructive pulmonary disease) ([place]), Coronary artery disease, Depression, DVT (deep venous thrombosis) ([place]), ESRD (end stage renal disease) ([place]), History of blood transfusion (04/2018), Hypertension, Meningitis, Pain in limb (07/30/2013), PE (pulmonary embolism), Peripheral vascular disease ([place]), Restless legs, Shortness of breath, Sleep apnea, SOB (shortness of breath) (03/03/2019), and Vertigo.   Surgical History:   Past Surgical History:  Procedure Laterality Date    ABDOMINAL AORTAGRAM N/A 07/26/2013   Procedure: ABDOMINAL AORTAGRAM;  Surgeon: [name] Overton, MD;  Location: [facility] CATH LAB;  Service: Cardiovascular;  Laterality: N/A;   ABDOMINAL HYSTERECTOMY     AV FISTULA PLACEMENT Left 05/05/2018   Procedure: ARTERIOVENOUS (AV) FISTULA CREATION BRACHIOCEPHALIC;  Surgeon: Waynetta Sandy, MD;  Location: [place];  Service: Vascular;  Laterality: Left;   AV FISTULA PLACEMENT Left 07/16/2018   Procedure: ARTERIOVENOUS FISTULA CREATION;  Surgeon: Waynetta Sandy, MD;  Location: [place];  Service: Vascular;  Laterality: Left;   [place] Left 09/03/2018   Procedure: BASILIC VEIN TRANSPOSITION SECOND STAGE;  Surgeon: Waynetta Sandy, MD;  Location: [place];  Service: Vascular;  Laterality: Left;   BREAST BIOPSY Left    CESAREAN SECTION     X 3 1974-1977   CHOLECYSTECTOMY     CHOLECYSTECTOMY  1980/s   COLECTOMY  2010   DIVERTICULOSIS SURGERY-2002  2012   FEMORAL-POPLITEAL BYPASS GRAFT Left 08/13/2013   Procedure: BYPASS GRAFT FEMORAL-POPLITEAL ARTERY WITH NON-REVERSED SAPHANEOUS VEIN; ULTRASOUND GUIDED;  Surgeon: Mal [name], MD;  Location: [place];  Service: Vascular;  Laterality: Left;   INSERTION OF DIALYSIS CATHETER Right 04/30/2018   Procedure: INSERTION OF Right Internal Jugular DIALYSIS CATHETER;  Surgeon: [name], MD;  Location: [facility] OR;  Service: Vascular;  Laterality: Right;   IR FLUORO GUIDE CV LINE RIGHT  07/24/2020   IR REMOVAL TUN CV CATH W/O FL  07/21/2020   IR US GUIDE VASC ACCESS RIGHT  07/24/2020   LEFT HEART CATH AND CORONARY ANGIOGRAPHY N/A 05/01/2021   Procedure: LEFT HEART CATH AND CORONARY ANGIOGRAPHY;  Surgeon: [name], MD;  Location: [place] CV LAB;  Service: Cardiovascular;  Laterality: N/A;   LOWER EXTREMITY ANGIOGRAM Left 07/26/2013   Procedure: LOWER EXTREMITY ANGIOGRAM;  Surgeon: [name] La Blanca, MD;  Location: [facility] CATH LAB;  Service: Cardiovascular;  Laterality: Left;     Social History:    reports that she has been smoking cigarettes. She has a 25.00 pack-year smoking history. She has quit using smokeless tobacco. She reports current drug use. Drug: Oxycodone. She reports that she does not drink alcohol.   Family History:  Her family history includes Bleeding Disorder in her sister; Cancer (age of onset: 73) in her cousin; Cancer (age of onset: 33) in her mother; Cancer (age of onset: 31) in her father; Hypertension in her daughter and sister. There is no history of Breast cancer.   Allergies Allergies  Allergen Reactions   Carnosine     Other reaction(s): Unknown   Gadolinium Derivatives  Hives and Other (See Comments)    HIVES, Desc: HIVES W/ "DYE" USED FOR 1ST CT SCAN BUT NOT 2ND, NO PREMEDS USED, PT UNCERTAIN OF CIRCUMSTANCES,,?POSSIBLE MRI CONTRAST ALLERGY, ALL STUDIES DONE "SOMEWHERE" IN [place]//A.C., Onset Date: [id number]   Iohexol Other (See Comments)     Code: HIVES, Desc: HIVES W/ "DYE" USED FOR 1ST CT SCAN BUT NOT 2ND, NO PREMEDS USED, PT UNCERTAIN OF CIRCUMSTANCES,,?POSSIBLE MRI CONTRAST ALLERGY, ALL STUDIES DONE "SOMEWHERE" IN [place]//A.C., Onset Date: 43154008    Iodine Hives   Naltrexone     Other reaction(s): Unknown     Home Medications  Prior to Admission medications   Medication Sig Start Date End Date Taking? Authorizing Provider  acetaminophen (TYLENOL) 500 MG tablet Take 1,000 mg by mouth 2 (two) times daily as needed for moderate pain or headache.    [provider]  albuterol (VENTOLIN HFA) 108 (90 Base) MCG/ACT inhaler Inhale 2 puffs into the lungs every 6 (six) hours as needed for wheezing or shortness of breath. 08/03/20   [name], FNP  amLODipine (NORVASC) 5 MG tablet Take 1 tablet (5 mg total) by mouth daily. 06/07/21 06/07/22  [name], FNP  apixaban (ELIQUIS) 2.5 MG TABS tablet Take 1 tablet (2.5 mg total) by mouth 2 (two) times daily. 06/13/21   Truitt Merle, MD  Ascorbic Acid (VITAMIN C) 100 MG tablet Take 100 mg by mouth  daily.    [provider]  atorvastatin (LIPITOR) 10 MG tablet Take 1 tablet (10 mg total) by mouth daily. 06/05/21   Cantwell, Celeste C, PA-C  AURYXIA 1 GM 210 MG(Fe) tablet Take 420-840 mg by mouth with breakfast, with lunch, and with evening meal. Taking  2 tablets daily (420mg ) with each snack 05/15/21   [provider]  Biotin 10000 MCG TABS Take 10,000 mcg by mouth daily.     [provider]  carvedilol (COREG) 12.5 MG tablet Take 12.5 mg by mouth 2 (two) times daily with a meal.     [provider]  lanthanum (FOSRENOL) 1000 MG chewable tablet Chew 1,000 mg by mouth See admin instructions. Take 1000 mg with each meal and each snack    [provider]  meclizine (ANTIVERT) 12.5 MG tablet Take 1 tablet (12.5 mg total) by mouth 3 (three) times daily as needed for dizziness. 04/12/21   Glendale Chard, MD  multivitamin (RENA-VIT) TABS tablet Take 1 tablet by mouth at bedtime.    Edrick Oh, MD  mupirocin ointment (BACTROBAN) 2 % Apply 1 application topically daily as needed (skin bumps). 04/15/19   [name], FNP  nystatin (NYSTATIN) powder Apply 1 application topically 4 (four) times daily as needed. Patient taking differently: Apply 1 application topically 4 (four) times daily as needed (irriation). 08/03/20   [name], FNP  omeprazole (PRILOSEC) 20 MG capsule Take 1 capsule (20 mg total) by mouth daily. 05/17/21   Truitt Merle, MD  predniSONE (DELTASONE) 10 MG tablet Take 4 tablets (40 mg) daily for 6 days, then, Take 3 tablets (30 mg) daily for 6 days, then, Take 2 tablets (20 mg) daily for 6 days, then, Take 1 tablets (10 mg) daily for 2 days, then stop 05/31/21   Jonetta Osgood, MD  vitamin E 200 UNIT capsule Take 200 Units by mouth daily.    [provider]    Iona Beard, MD Internal medicine, PGY-2 07/17/21 11:25 AM Pager [phone]

## 2021-07-18 ENCOUNTER — Inpatient Hospital Stay (HOSPITAL_COMMUNITY): Payer: Medicare Other

## 2021-07-18 DIAGNOSIS — M545 Low back pain, unspecified: Secondary | ICD-10-CM | POA: Diagnosis not present

## 2021-07-18 DIAGNOSIS — B9561 Methicillin susceptible Staphylococcus aureus infection as the cause of diseases classified elsewhere: Secondary | ICD-10-CM | POA: Diagnosis not present

## 2021-07-18 DIAGNOSIS — N186 End stage renal disease: Secondary | ICD-10-CM | POA: Diagnosis not present

## 2021-07-18 DIAGNOSIS — R7881 Bacteremia: Secondary | ICD-10-CM | POA: Diagnosis not present

## 2021-07-18 LAB — RENAL FUNCTION PANEL
Albumin: 2 g/dL — ABNORMAL LOW (ref 3.5–5.0)
Anion gap: 15 (ref 5–15)
BUN: 50 mg/dL — ABNORMAL HIGH (ref 8–23)
CO2: 14 mmol/L — ABNORMAL LOW (ref 22–32)
Calcium: 7 mg/dL — ABNORMAL LOW (ref 8.9–10.3)
Chloride: 104 mmol/L (ref 98–111)
Creatinine, Ser: 8.91 mg/dL — ABNORMAL HIGH (ref 0.44–1.00)
GFR, Estimated: 5 mL/min — ABNORMAL LOW (ref >60)
Glucose, Bld: 113 mg/dL — ABNORMAL HIGH (ref 70–99)
Phosphorus: 5.9 mg/dL — ABNORMAL HIGH (ref 2.5–4.6)
Potassium: 4.5 mmol/L (ref 3.5–5.1)
Sodium: 133 mmol/L — ABNORMAL LOW (ref 135–145)

## 2021-07-18 LAB — GLUCOSE, CAPILLARY
Glucose-Capillary: 115 mg/dL — ABNORMAL HIGH (ref 70–99)
Glucose-Capillary: 110 mg/dL — ABNORMAL HIGH (ref 70–99)
Glucose-Capillary: 120 mg/dL — ABNORMAL HIGH (ref 70–99)
Glucose-Capillary: 76 mg/dL (ref 70–99)

## 2021-07-18 LAB — HEPARIN LEVEL (UNFRACTIONATED)
Heparin Unfractionated: 0.19 IU/mL — ABNORMAL LOW (ref 0.30–0.70)
Heparin Unfractionated: 0.4 IU/mL (ref 0.30–0.70)

## 2021-07-18 LAB — CBC
HCT: 25.6 % — ABNORMAL LOW (ref 36.0–46.0)
Hemoglobin: 8.1 g/dL — ABNORMAL LOW (ref 12.0–15.0)
MCH: 29.2 pg (ref 26.0–34.0)
MCHC: 31.6 g/dL (ref 30.0–36.0)
MCV: 92.4 fL (ref 80.0–100.0)
Platelets: 159 10*3/uL (ref 150–400)
RBC: 2.77 MIL/uL — ABNORMAL LOW (ref 3.87–5.11)
RDW: 16.3 % — ABNORMAL HIGH (ref 11.5–15.5)
WBC: 20.4 10*3/uL — ABNORMAL HIGH (ref 4.0–10.5)
nRBC: 0 % (ref 0.0–0.2)

## 2021-07-18 LAB — HEPATITIS B SURFACE ANTIGEN: Hepatitis B Surface Ag: NONREACTIVE

## 2021-07-18 IMAGING — MR MR LUMBAR SPINE W/O CM
4 of 5 series · 16 of 48 positions shown · non-contrast
Comparison: Radiographs of the lumbar spine [DATE].

CLINICAL DATA: Epidural abscess suspected. Additional provided:
Bilateral flank pain, worsening for several weeks, dialysis patient,
patient unable to walk due to pain.

EXAM:
MRI LUMBAR SPINE WITHOUT CONTRAST
TECHNIQUE: Multiplanar, multisequence MR imaging of the lumbar spine was
performed. No intravenous contrast was administered.

[Series 2: T2 · sagittal · 4.0mm · 0.55mm/px · 6 of 15 slices shown (1 of 2)]
[im 1/15]
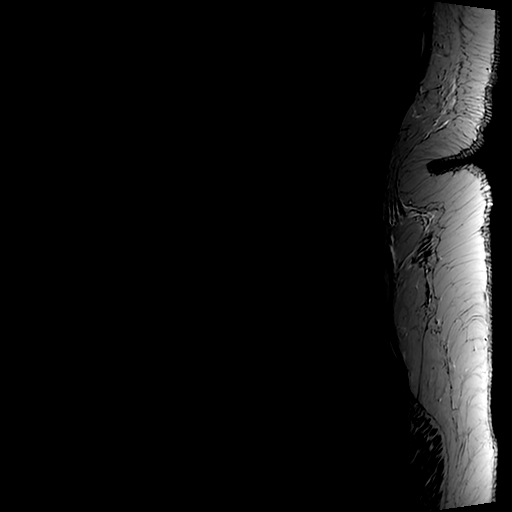
[im 3/15]
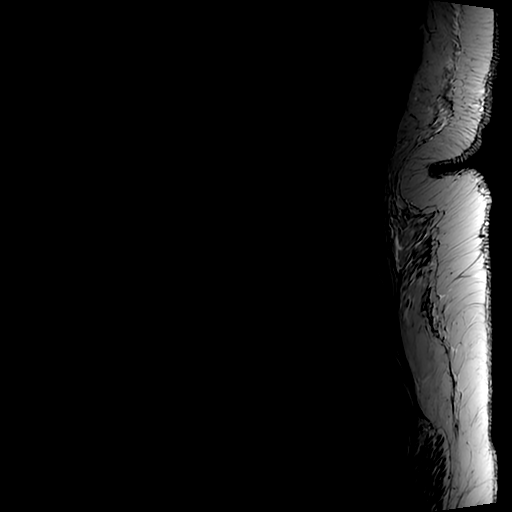
[im 6/15]
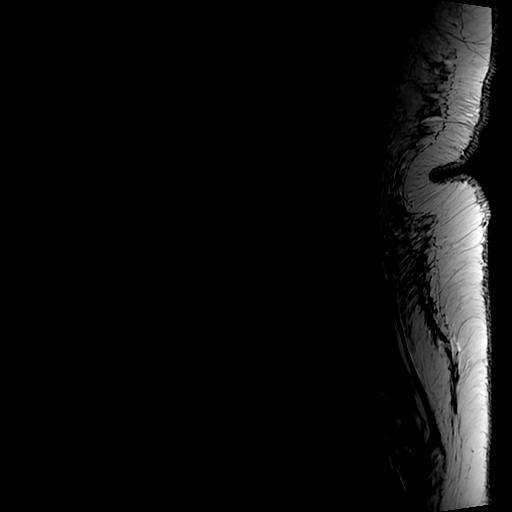
[im 9/15]
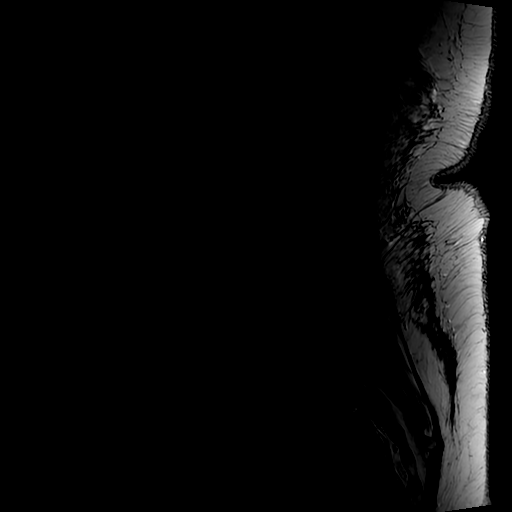
[im 12/15]
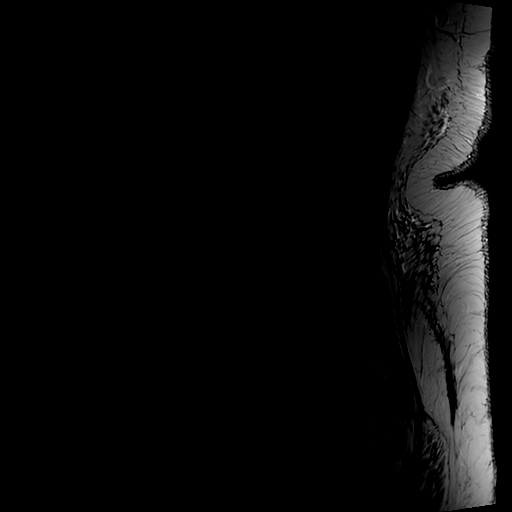
[im 15/15]
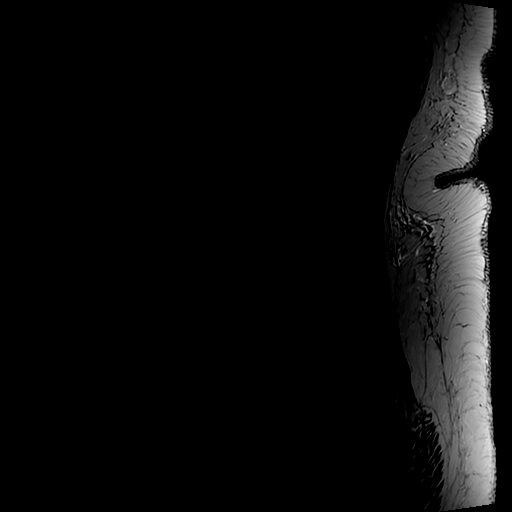

[Series 4: T1 · sagittal · 4.0mm · 0.55mm/px · 3 of 15 slices shown (1 of 2)]
[im 3/15]
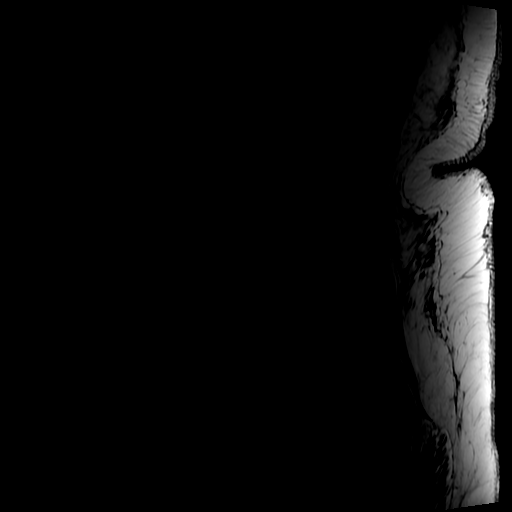
[im 8/15]
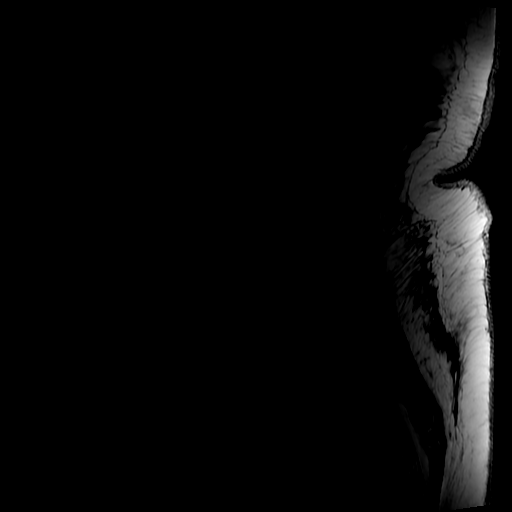
[im 12/15]
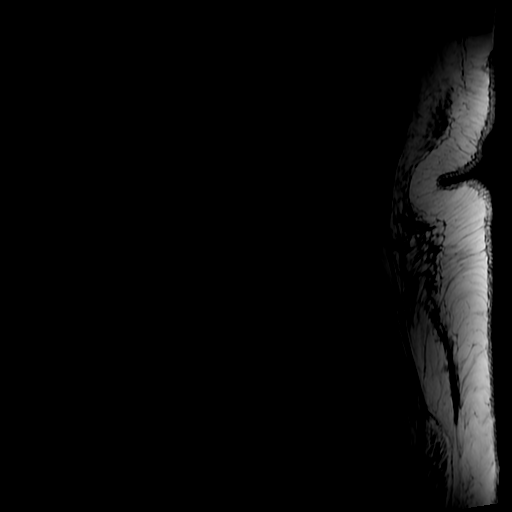

[Series 5: T2 · axial · 4.0mm · 0.39mm/px · z∈[-83,+81]mm · 4 of 32 slices shown (2 of 2)]
[im 1/32]
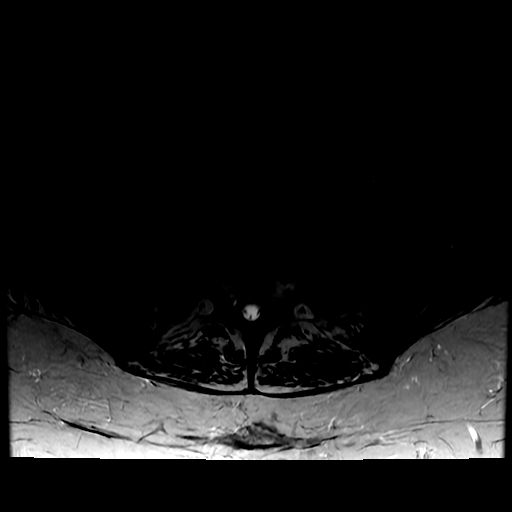
[im 5/32]
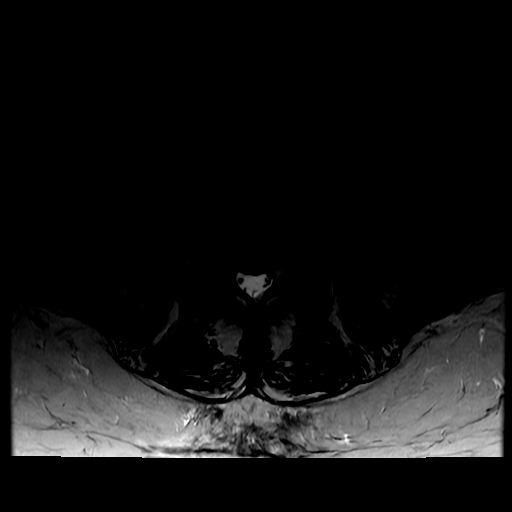
[im 17/32]
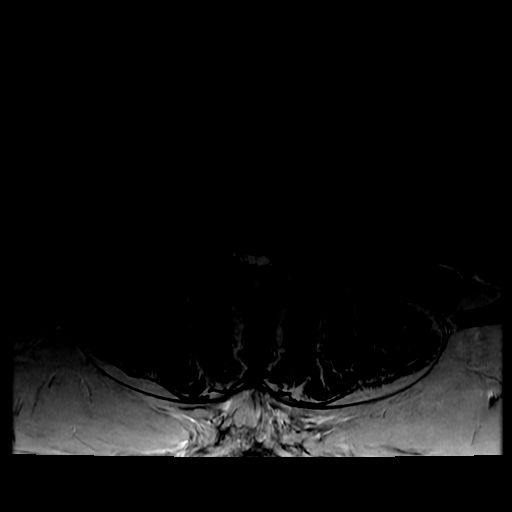
[im 27/32]
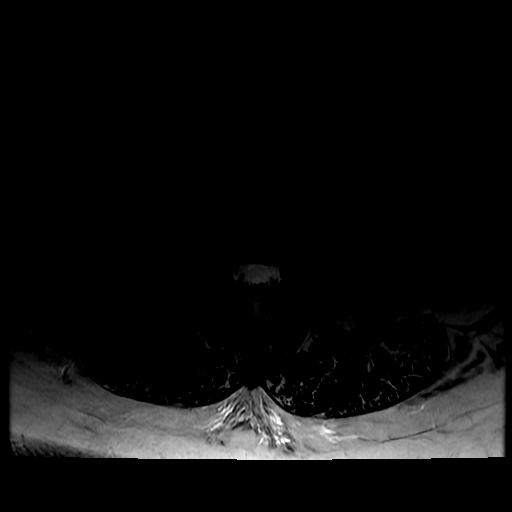

[Series 6: T1 · axial · 4.0mm · 0.39mm/px · z∈[-65,+81]mm · 3 of 32 slices shown (2 of 2)]
[im 5/32]
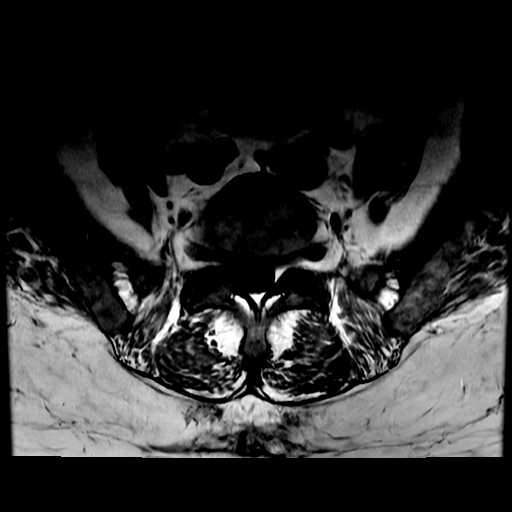
[im 17/32]
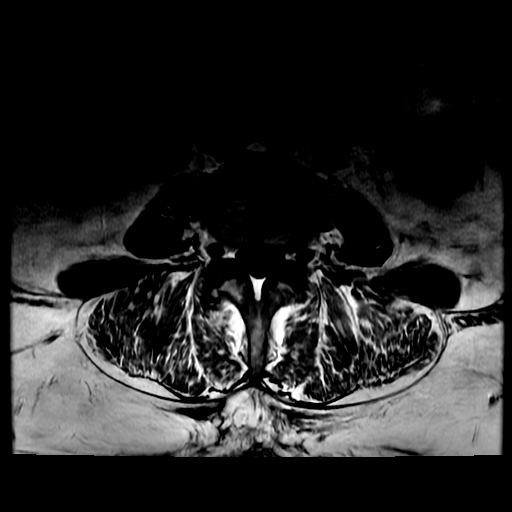
[im 27/32]
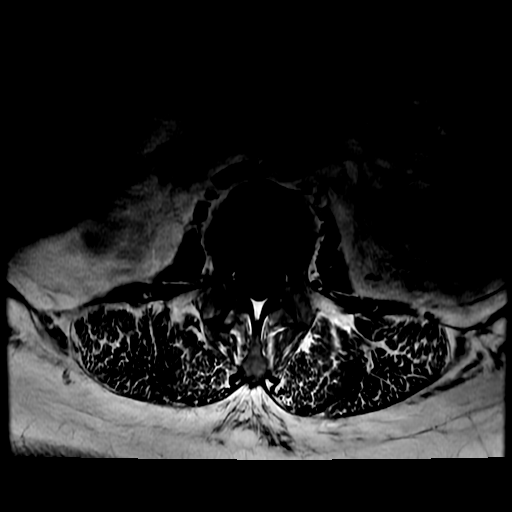

[16 of 48 positions shown; findings below may reference images not displayed]

FINDINGS: Segmentation: 5 lumbar vertebrae. The caudal most well-formed
intervertebral disc space is designated L5-S1.

Alignment:  Trace T11-T12, L1-L2 and L3-L4 grade 1 retrolisthesis.

Vertebrae: There are Schmorl nodes within the T12 inferior endplate,
L1 inferior endplate, L3 superior endplate and L3 inferior endplate.
There is minimal marrow edema surrounding the Schmorl node within
the T12 inferior endplate, and this Schmorl node may be acute or
subacute. Trace multilevel endplate edema elsewhere, which appear
degenerative. Mild edema within the right L5 articular pillar also
appears degenerative (series 3, image 5). No specific findings to
suggest discitis/osteomyelitis.

Conus medullaris and cauda equina: Conus extends to the L1-L2 level.
No signal abnormality within the visualized distal spinal cord. No
epidural abscess is identified on this noncontrast examination.

Paraspinal and other soft tissues: Atrophic kidneys. Atrophy of the
lumbar paraspinal musculature. Additionally, there is nonspecific
edema signal within the subcutaneous fat overlying the thoracolumbar
spine bilaterally.

Disc levels:

Mild disc degeneration throughout the lumbar and visualized lower
thoracic spine.

T11-T12: Imaged sagittally. Trace grade 1 retrolisthesis. Small disc
bulge. Mild partial effacement of the ventral thecal sac without
spinal cord mass effect. No appreciable significant foraminal
stenosis.

T12-L1: Imaged sagittally. Minimal disc bulge. No significant spinal
canal or foraminal stenosis.

L1-L2: Trace grade 1 retrolisthesis. No significant disc herniation
or stenosis.

L2-L3: Shallow left subarticular/foraminal disc protrusion. Minimal
left subarticular narrowing without appreciable nerve root
impingement. Central canal patent. No significant foraminal
stenosis.

L3-L4: Trace grade 1 retrolisthesis. Disc bulge. Superimposed
broad-based central disc protrusion eccentric to the right. Mild
facet arthrosis/ligamentum flavum hypertrophy. Trace right facet
joint effusion. Mild bilateral subarticular and central canal
narrowing without frank nerve root impingement. Mild relative
bilateral neural foraminal narrowing.

L4-L5: Disc bulge. Superimposed broad-based central disc protrusion
at site of posterior annular fissure, eccentric to the right. Mild
facet arthrosis/ligamentum flavum hypertrophy. Trace right facet
joint effusion. The disc protrusion contributes to bilateral
subarticular narrowing (mild/moderate right, mild left), contacting
and crowding the descending right L5 nerve root (series 5, image
23). Mild relative narrowing of the central canal. Bilateral neural
foraminal narrowing (mild/moderate right, mild left).

L5-S1: Disc bulge with endplate spurring. Superimposed broad-based
central disc protrusion at site of posterior annular fissure. Mild
facet arthrosis. The disc protrusion contributes to bilateral
subarticular stenosis (moderate right, mild left). The disc
protrusion contacts and crowds the descending right S1 nerve root.
The disc protrusion may also contact the descending left S1 nerve
root. Mild relative narrowing of the central canal. No significant
foraminal stenosis.
IMPRESSION: No specific findings to suggest discitis/osteomyelitis at this time.
No evidence of epidural abscess on this non-contrast study.

Schmorl nodes within the T12 inferior endplate, L1 inferior
endplate, L3 superior endplate and L3 inferior endplate. There is
mild marrow edema surrounding the Schmorl node within the T12
inferior endplate, and this Schmorl node may be acute/subacute.
Minimal edema elsewhere, as described and degenerative in
appearance.

Lumbar spondylosis, as outlined and with findings most notably as
follows.

At L4-L5, there is a broad-based central disc protrusion at site of
posterior annular fissure. This contributes to multifactorial
bilateral subarticular stenosis (mild/moderate right, mild left),
contacting and crowding the descending right L5 nerve root. Mild
central canal narrowing. Bilateral neural foraminal narrowing
(mild/moderate right, mild left). Trace right facet joint effusion.

At L5-S1, a broad-based central disc protrusion at site of posterior
annular fissure contributes to multifactorial bilateral subarticular
stenosis (moderate right, mild left). The disc protrusion contacts
and crowds the descending right S1 nerve root. The disc protrusion
may also contact the descending left S1 nerve root. Mild relative
narrowing of the central canal.

No more than mild spinal canal or neural foraminal narrowing at the
remaining levels.

Trace right facet joint effusion also present at L3-L4.

## 2021-07-18 MED ORDER — PENTAFLUOROPROP-TETRAFLUOROETH EX AERO: 1.0000 "application " | INHALATION_SPRAY | CUTANEOUS | Status: DC | PRN

## 2021-07-18 MED ORDER — SODIUM CHLORIDE 0.9% FLUSH
10.0000 mL | Freq: Two times a day (BID) | INTRAVENOUS | Status: DC
  Administered 2021-07-18 – 2021-07-21 (×3): 10 mL

## 2021-07-18 MED ORDER — LIDOCAINE HCL (PF) 1 % IJ SOLN: 5.0000 mL | INTRAMUSCULAR | Status: DC | PRN

## 2021-07-18 MED ORDER — ASPIRIN EC 81 MG PO TBEC
81.0000 mg | DELAYED_RELEASE_TABLET | Freq: Every day | ORAL | Status: DC
  Administered 2021-07-18 – 2021-07-22 (×5): 81 mg via ORAL
  Filled 2021-07-18 (×5): qty 1

## 2021-07-18 MED ORDER — DARBEPOETIN ALFA 40 MCG/0.4ML IJ SOSY
PREFILLED_SYRINGE | INTRAMUSCULAR | Status: AC
  Administered 2021-07-18: 40 ug via INTRAVENOUS
  Filled 2021-07-18: qty 0.4

## 2021-07-18 MED ORDER — DARBEPOETIN ALFA 40 MCG/0.4ML IJ SOSY
40.0000 ug | PREFILLED_SYRINGE | INTRAMUSCULAR | Status: DC
  Filled 2021-07-18: qty 0.4

## 2021-07-18 MED ORDER — SODIUM CHLORIDE 0.9 % IV SOLN: 100.0000 mL | INTRAVENOUS | Status: DC | PRN

## 2021-07-18 MED ORDER — ALTEPLASE 2 MG IJ SOLR: 2.0000 mg | Freq: Once | INTRAMUSCULAR | Status: DC | PRN

## 2021-07-18 MED ORDER — SODIUM CHLORIDE 0.9% FLUSH: 10.0000 mL | INTRAVENOUS | Status: DC | PRN

## 2021-07-18 MED ORDER — HEPARIN SODIUM (PORCINE) 1000 UNIT/ML DIALYSIS: 1000.0000 [IU] | INTRAMUSCULAR | Status: DC | PRN

## 2021-07-18 MED ORDER — ATORVASTATIN CALCIUM 10 MG PO TABS
10.0000 mg | ORAL_TABLET | Freq: Every day | ORAL | Status: DC
  Administered 2021-07-18 – 2021-07-21 (×4): 10 mg via ORAL
  Filled 2021-07-18 (×5): qty 1

## 2021-07-18 MED ORDER — ALBUTEROL SULFATE (2.5 MG/3ML) 0.083% IN NEBU: 2.5000 mg | INHALATION_SOLUTION | Freq: Four times a day (QID) | RESPIRATORY_TRACT | Status: DC | PRN

## 2021-07-18 MED ORDER — LIDOCAINE-PRILOCAINE 2.5-2.5 % EX CREA: 1.0000 "application " | TOPICAL_CREAM | CUTANEOUS | Status: DC | PRN

## 2021-07-18 MED ORDER — HEPARIN SODIUM (PORCINE) 1000 UNIT/ML IJ SOLN
INTRAMUSCULAR | Status: AC
  Filled 2021-07-18: qty 4

## 2021-07-18 NOTE — Progress Notes (Signed)
[place] for Heparin Indication: Hx of DVT  Patient Measurements: Height: 5\' 1"  (154.9 cm) Weight: 94 kg (207 lb 3.7 oz) IBW/kg (Calculated) : 47.8 Heparin Dosing Weight: 69 kg   Vital Signs: Temp: 98.2 F (36.8 C) (07/20 0344) Temp Source: Oral (07/20 0344) BP: 101/55 (07/20 0400) Pulse Rate: 86 (07/20 0400)  Labs: Recent Labs    07/16/21 1402 07/17/21 0408 07/17/21 0638 07/17/21 1414 07/18/21 0341  HGB 11.9* 10.7*  --   --  8.1*  HCT 37.8 34.8*  --   --  25.6*  PLT 143* 146*  --   --  159  APTT 24 41*  --  45*  --   LABPROT 16.5*  --   --   --   --   INR 1.3*  --   --   --   --   HEPARINUNFRC  --  0.16*  --  0.16* 0.19*  CREATININE 7.31* 7.20*  --   --   --   TROPONINIHS  --  83* 87*  --   --      Estimated Creatinine Clearance: 8 mL/min (A) (by C-G formula based on SCr of 7.2 mg/dL (H)).  Medications:  Eliquis 2.5 mg BID PTA  Assessment: 60 YOF presents to ED critically ill due to MSSA bacteremia and septic shock. Patient has had hx of VTE (05/01/21) and was on Eliquis 2.5 mg PO BID prior to admission. Last dose unknown but within past week per patient. Heparin level and PTT correlating.  Heparin level subtherapeutic (0.19) on gtt at 1400 units/hr. No issues with line or bleeding reported per RN.  Goal of Therapy:  Heparin level 0.3 -0.7 units/ml Monitor platelets by anticoagulation protocol: Yes   Plan:  Increase heparin to 1600 units/hr F/u 8 hr heparin level   Sherlon Handing, PharmD, BCPS Please see amion for complete clinical pharmacist phone list 07/18/2021, 4:50 AM

## 2021-07-18 NOTE — Progress Notes (Signed)
[place] for IV Heparin Indication: Hx of DVT  Patient Measurements: Height: 5\' 1"  (154.9 cm) Weight: 93.7 kg (206 lb 9.1 oz) IBW/kg (Calculated) : 47.8 Heparin Dosing Weight: 69 kg   Vital Signs: Temp: 97.8 F (36.6 C) (07/20 1345) Temp Source: Oral (07/20 1345) BP: 108/53 (07/20 1500) Pulse Rate: 102 (07/20 1500)  Labs: Recent Labs    07/16/21 1402 07/16/21 1402 07/17/21 0408 07/17/21 0638 07/17/21 1414 07/18/21 0341 07/18/21 1431  HGB 11.9*  --  10.7*  --   --  8.1*  --   HCT 37.8  --  34.8*  --   --  25.6*  --   PLT 143*  --  146*  --   --  159  --   APTT 24  --  41*  --  45*  --   --   LABPROT 16.5*  --   --   --   --   --   --   INR 1.3*  --   --   --   --   --   --   HEPARINUNFRC  --    < > 0.16*  --  0.16* 0.19* 0.40  CREATININE 7.31*  --  7.20*  --   --   --   --   TROPONINIHS  --   --  83* 87*  --   --   --    < > = values in this interval not displayed.     Estimated Creatinine Clearance: 8 mL/min (A) (by C-G formula based on SCr of 7.2 mg/dL (H)).  Medications:  Apixaban 2.5 mg BID PTA  Assessment: 65 yr old woman with ESRD (on MWF HD) presented to ED critically ill due to MSSA bacteremia and septic shock. Patient has hx of VTE (05/01/21) and was on apixaban 2.5 mg PO BID PTA (last dose unknown, but within past week per patient). Heparin level and PTT correlating.  Heparin level ~6.5 hrs after heparin infusion was increased to 1600 units/hr was 0.40 units/ml, which is within the goal range for this pt.H/H 8.1/25.6, plt 159 (Hgb lower today; nephrology plans to resume ESA). Per HD RN, no issues with IV or bleeding observed.  Goal of Therapy:  Heparin level 0.3 -0.7 units/ml Monitor platelets by anticoagulation protocol: Yes   Plan:  Continue heparin infusion at 1600 units/hr Check confirmatory heparin level in 8 hrs Monitor daily heparin level, CBC Monitor for bleeding F/U transition to oral anticoagulant when  able  Gillermina Hu, PharmD, BCPS, [facility] Clinical Pharmacist 07/18/2021, 3:25 PM

## 2021-07-18 NOTE — Progress Notes (Signed)
NAME:  Whitney Walker, MRN:  [id number], DOB:  1956-09-22, LOS: 2 ADMISSION DATE:  07/16/2021, CONSULTATION DATE: 7/18 REFERRING MD:  pfeiffer, CHIEF COMPLAINT: septic shock  History of Present Illness:  61 yof w/ past medical hx as provided below; presented to the ED 7/18 w/ cc  gradual onset of constant diffuse achy low back pain which had progressed over several weeks, followed by new onset of nausea, vomiting and diarrhea with fever 100. She completed full HD session 7/18 and EMS called afterwards as pt reported worsening pain. Still makes urine though minimal and last time awas bout 3d PTA.  In ER: + fever 99.7 HR initially 90s bp 93/65, somewhat toxic appearing, WBC 26.9, Reporting she was tender to palpation of the bilateral paralumbar region w/ extension of this discomfort to the buttocks.  CT imaging of chest/abd/pelvis: RUL subpleural nodule. LLL pulm nodule. Enlarged Pulm arteries, two slightly heterogenous appearing renal lesions (unclear etiology), no lytic or osseous lesions, no abd pathology. No nephrolithiasis  Got more hypotensive while in ER. Latate 3.3 got 3 liters IVF. Cultures sent. Started on vanc and cefepime. PCCM asked to admit    Pertinent  Medical History  ESRD (MWF), CHF, COPD prior PE/DVT on DOAC. Anemia of chronic disease. Colon cancer. HTN, PVD RLS Sleep apnea   Significant Hospital Events: Including procedures, antibiotic start and stop dates in addition to other pertinent events   7/18 admitted w/ working dx sepsis. Source not clear but seemed UT source.  Got IVFs 3 liters.  CT chest/abd/pelvis w/out clear source. Had two slightly heterogenous appearing renal lesions (unclear etiology. Lactate 3.3. started on Norepi. Cefepime and Vanc started.   Interim History / Subjective:  Patient awake this morning. States her left wrist is somewhat swollen and painful. Otherwise back pain and chest pain improved with oxycodone this morning. Daughter called and updated in  room.  Objective   Blood pressure (!) 98/55, pulse 93, temperature 98.2 F (36.8 C), temperature source Oral, resp. rate 19, height 5\' 1"  (1.549 m), weight 93.7 kg, SpO2 96 %.        Intake/Output Summary (Last 24 hours) at 07/18/2021 0727 Last data filed at 07/18/2021 0700 Gross per 24 hour  Intake 1151.04 ml  Output --  Net 1151.04 ml    Filed Weights   07/16/21 1550 07/16/21 2000 07/18/21 0500  Weight: 90.7 kg 94 kg 93.7 kg    Examination: General: Adult female, resting in bed, in NAD. Neuro: A&O x 3, no deficits. HEENT: [place]/AT. Sclerae anicteric. EOMI.  MM moist Cardiovascular: RRR, no M/R/G.  Lungs: Respirations even and unlabored.  CTA bilaterally, No W/R/R.  Abdomen: BS x 4, soft, nontender. Musculoskeletal: TTP of bilateral flank/lower back improved from yesterday. Right wrist with limited ROM secondary to pain, minimal swelling, no warmth or redness Skin: Warm, dry, no rashes.  Resolved Hospital Problem list     Assessment & Plan:  Septic Shock secondary to MSSA bacteremia suspect from right [facility] for dialysis  MSSA bacteremia in 2/4  blood cultures on 7/18, no growth on repeat culture from 7/19. MRI lumbar spine without signs of abscess osteo. TTE without vegetations  -ID consulted, appreciate assistance - IR consulted for dialysis cath removal after HD today, patient requesting sedation prior to removal - Off pressor since yesterday  - Continue cefazolin for 2 weeks  - Follow up blood cultures - Stable to transfer out of ICU  ESRD on HD MWF - completed full session Mon 7/18. -  Nephrology consulted, appreciate assistance - HD today - catheter holiday given MSSA bacteremia after HD today - Will likely need new access placed 7/22 or 7/23  Hx DVT / PE. - Heparin infusion   HTN, HLD, PVD, CAD (LHC from May 2022 with mod disease RCA and Cx, diffuse coronary calcifications, minimal disease LAD, normal LVEF and LVEDP). - Restart atorvastatin  - Hold home  Amlodipine, Apixaban, Carvedilol.  RUL and LLL pulmonary nodules - stable. - F/u CT in 12 - 18 months.  Renal lesions on CT - US findings consistent with simple right  renal cysts  Best Practice (right click and "Reselect all SmartList Selections" daily)  Diet/type: Regular consistency (see orders) and NPO DVT prophylaxis: systemic heparin GI prophylaxis: H2B Lines: N/A Foley:  N/A Code Status:  full code Last date of multidisciplinary goals of care discussion: not yet.  Labs   CBC: Recent Labs  Lab 07/16/21 1402 07/17/21 0408 07/18/21 0341  WBC 26.9* 29.1* 20.4*  NEUTROABS 23.8*  --   --   HGB 11.9* 10.7* 8.1*  HCT 37.8 34.8* 25.6*  MCV 92.2 94.3 92.4  PLT 143* 146* 159     Basic Metabolic Panel: Recent Labs  Lab 07/16/21 1402 07/17/21 0408  NA 132* 131*  K 4.8 4.9  CL 94* 101  CO2 19* 15*  GLUCOSE 99 131*  BUN 24* 30*  CREATININE 7.31* 7.20*  CALCIUM 8.2* 7.3*  MG  --  1.7  PHOS  --  6.5*    GFR: Estimated Creatinine Clearance: 8 mL/min (A) (by C-G formula based on SCr of 7.2 mg/dL (H)). Recent Labs  Lab 07/16/21 1402 07/16/21 1510 07/17/21 0408 07/18/21 0341  WBC 26.9*  --  29.1* 20.4*  LATICACIDVEN 3.3* 1.7  --   --      Liver Function Tests: Recent Labs  Lab 07/16/21 1402  AST 39  ALT 21  ALKPHOS 93  BILITOT 1.2  PROT 7.8  ALBUMIN 2.7*    No results for input(s): LIPASE, AMYLASE in the last 168 hours. No results for input(s): AMMONIA in the last 168 hours.  ABG    Component Value Date/Time   HCO3 25.5 03/03/2019 0335   TCO2 26 03/03/2019 0335   ACIDBASEDEF 14.0 (H) 03/03/2019 0002   O2SAT 39.0 03/03/2019 0335      Coagulation Profile: Recent Labs  Lab 07/16/21 1402  INR 1.3*     Cardiac Enzymes: No results for input(s): CKTOTAL, CKMB, CKMBINDEX, TROPONINI in the last 168 hours.  HbA1C: Hgb A1c MFr Bld  Date/Time Value Ref Range Status  07/16/2021 02:02 PM 6.2 (H) 4.8 - 5.6 % Final    Comment:    (NOTE) Pre  diabetes:          5.7%-6.4%  Diabetes:              >6.4%  Glycemic control for   <7.0% adults with diabetes   06/04/2018 12:00 AM 5.1 4.0 - 6.0 Final    CBG: Recent Labs  Lab 07/17/21 1135 07/17/21 1511 07/17/21 1938 07/17/21 2336 07/18/21 0338  GLUCAP 95 97 156* 120* 115*     Review of Systems:   All negative; except for those that are bolded, which indicate positives.  Constitutional: weight loss, weight gain, night sweats, fevers, chills, fatigue, weakness.  HEENT: headaches, sore throat, sneezing, nasal congestion, post nasal drip, difficulty swallowing, tooth/dental problems, visual complaints, visual changes, ear aches. Neuro: difficulty with speech, weakness, numbness, ataxia. CV:  chest pain, orthopnea, PND,  swelling in lower extremities, dizziness, palpitations, syncope.  Resp: cough, hemoptysis, dyspnea, wheezing. GI: heartburn, indigestion, abdominal pain, nausea, vomiting, diarrhea, constipation, change in bowel habits, loss of appetite, hematemesis, melena, hematochezia.  GU: dysuria, change in color of urine, urgency or frequency, flank pain, hematuria. MSK: joint pain or swelling, decreased range of motion. Psych: change in mood or affect, depression, anxiety, suicidal ideations, homicidal ideations. Skin: rash, itching, bruising.   Past Medical History:  She,  has a past medical history of Anemia, Anxiety, CHF (congestive heart failure) ([place]), Colon cancer ([place]), Complication of anesthesia, COPD (chronic obstructive pulmonary disease) ([place]), Coronary artery disease, Depression, DVT (deep venous thrombosis) ([place]), ESRD (end stage renal disease) ([place]), History of blood transfusion (04/2018), Hypertension, Meningitis, Pain in limb (07/30/2013), PE (pulmonary embolism), Peripheral vascular disease ([place]), Restless legs, Shortness of breath, Sleep apnea, SOB (shortness of breath) (03/03/2019), and Vertigo.   Surgical History:   Past Surgical History:  Procedure  Laterality Date   ABDOMINAL AORTAGRAM N/A 07/26/2013   Procedure: ABDOMINAL AORTAGRAM;  Surgeon: [name] [place], MD;  Location: [facility] CATH LAB;  Service: Cardiovascular;  Laterality: N/A;   ABDOMINAL HYSTERECTOMY     AV FISTULA PLACEMENT Left 05/05/2018   Procedure: ARTERIOVENOUS (AV) FISTULA CREATION BRACHIOCEPHALIC;  Surgeon: Waynetta Sandy, MD;  Location: [place];  Service: Vascular;  Laterality: Left;   AV FISTULA PLACEMENT Left 07/16/2018   Procedure: ARTERIOVENOUS FISTULA CREATION;  Surgeon: Waynetta Sandy, MD;  Location: [place];  Service: Vascular;  Laterality: Left;   [place] Left 09/03/2018   Procedure: BASILIC VEIN TRANSPOSITION SECOND STAGE;  Surgeon: Waynetta Sandy, MD;  Location: [place];  Service: Vascular;  Laterality: Left;   BREAST BIOPSY Left    CESAREAN SECTION     X 3 1974-1977   CHOLECYSTECTOMY     CHOLECYSTECTOMY  1980/s   COLECTOMY  2010   DIVERTICULOSIS SURGERY-2002  2012   FEMORAL-POPLITEAL BYPASS GRAFT Left 08/13/2013   Procedure: BYPASS GRAFT FEMORAL-POPLITEAL ARTERY WITH NON-REVERSED SAPHANEOUS VEIN; ULTRASOUND GUIDED;  Surgeon: Mal [name], MD;  Location: [place];  Service: Vascular;  Laterality: Left;   INSERTION OF DIALYSIS CATHETER Right 04/30/2018   Procedure: INSERTION OF Right Internal Jugular DIALYSIS CATHETER;  Surgeon: [name], MD;  Location: [facility] OR;  Service: Vascular;  Laterality: Right;   IR FLUORO GUIDE CV LINE RIGHT  07/24/2020   IR REMOVAL TUN CV CATH W/O FL  07/21/2020   IR US GUIDE VASC ACCESS RIGHT  07/24/2020   LEFT HEART CATH AND CORONARY ANGIOGRAPHY N/A 05/01/2021   Procedure: LEFT HEART CATH AND CORONARY ANGIOGRAPHY;  Surgeon: [name], MD;  Location: [place] CV LAB;  Service: Cardiovascular;  Laterality: N/A;   LOWER EXTREMITY ANGIOGRAM Left 07/26/2013   Procedure: LOWER EXTREMITY ANGIOGRAM;  Surgeon: [name] [place], MD;  Location: [facility] CATH LAB;  Service: Cardiovascular;  Laterality: Left;      Social History:   reports that she has been smoking cigarettes. She has a 25.00 pack-year smoking history. She has quit using smokeless tobacco. She reports current drug use. Drug: Oxycodone. She reports that she does not drink alcohol.   Family History:  Her family history includes Bleeding Disorder in her sister; Cancer (age of onset: 56) in her cousin; Cancer (age of onset: 49) in her mother; Cancer (age of onset: 46) in her father; Hypertension in her daughter and sister. There is no history of Breast cancer.   Allergies Allergies  Allergen Reactions   Carnosine  Other reaction(s): Unknown   Gadolinium Derivatives Hives and Other (See Comments)    HIVES, Desc: HIVES W/ "DYE" USED FOR 1ST CT SCAN BUT NOT 2ND, NO PREMEDS USED, PT UNCERTAIN OF CIRCUMSTANCES,,?POSSIBLE MRI CONTRAST ALLERGY, ALL STUDIES DONE "SOMEWHERE" IN [place]//A.C., Onset Date: [id number]   Iohexol Other (See Comments)     Code: HIVES, Desc: HIVES W/ "DYE" USED FOR 1ST CT SCAN BUT NOT 2ND, NO PREMEDS USED, PT UNCERTAIN OF CIRCUMSTANCES,,?POSSIBLE MRI CONTRAST ALLERGY, ALL STUDIES DONE "SOMEWHERE" IN [place]//A.C., Onset Date: [id number]    Iodine Hives   Naltrexone     Other reaction(s): Unknown     Home Medications  Prior to Admission medications   Medication Sig Start Date End Date Taking? Authorizing Provider  acetaminophen (TYLENOL) 500 MG tablet Take 1,000 mg by mouth 2 (two) times daily as needed for moderate pain or headache.    [provider]  albuterol (VENTOLIN HFA) 108 (90 Base) MCG/ACT inhaler Inhale 2 puffs into the lungs every 6 (six) hours as needed for wheezing or shortness of breath. 08/03/20   [name], FNP  amLODipine (NORVASC) 5 MG tablet Take 1 tablet (5 mg total) by mouth daily. 06/07/21 06/07/22  [name], FNP  apixaban (ELIQUIS) 2.5 MG TABS tablet Take 1 tablet (2.5 mg total) by mouth 2 (two) times daily. 06/13/21   Truitt Merle, MD  Ascorbic Acid (VITAMIN C) 100 MG  tablet Take 100 mg by mouth daily.    [provider]  atorvastatin (LIPITOR) 10 MG tablet Take 1 tablet (10 mg total) by mouth daily. 06/05/21   Cantwell, Celeste C, PA-C  AURYXIA 1 GM 210 MG(Fe) tablet Take 420-840 mg by mouth with breakfast, with lunch, and with evening meal. Taking  2 tablets daily (420mg ) with each snack 05/15/21   [provider]  Biotin 10000 MCG TABS Take 10,000 mcg by mouth daily.     [provider]  carvedilol (COREG) 12.5 MG tablet Take 12.5 mg by mouth 2 (two) times daily with a meal.     [provider]  lanthanum (FOSRENOL) 1000 MG chewable tablet Chew 1,000 mg by mouth See admin instructions. Take 1000 mg with each meal and each snack    [provider]  meclizine (ANTIVERT) 12.5 MG tablet Take 1 tablet (12.5 mg total) by mouth 3 (three) times daily as needed for dizziness. 04/12/21   Glendale Chard, MD  multivitamin (RENA-VIT) TABS tablet Take 1 tablet by mouth at bedtime.    Edrick Oh, MD  mupirocin ointment (BACTROBAN) 2 % Apply 1 application topically daily as needed (skin bumps). 04/15/19   [name], FNP  nystatin (NYSTATIN) powder Apply 1 application topically 4 (four) times daily as needed. Patient taking differently: Apply 1 application topically 4 (four) times daily as needed (irriation). 08/03/20   [name], FNP  omeprazole (PRILOSEC) 20 MG capsule Take 1 capsule (20 mg total) by mouth daily. 05/17/21   Truitt Merle, MD  predniSONE (DELTASONE) 10 MG tablet Take 4 tablets (40 mg) daily for 6 days, then, Take 3 tablets (30 mg) daily for 6 days, then, Take 2 tablets (20 mg) daily for 6 days, then, Take 1 tablets (10 mg) daily for 2 days, then stop 05/31/21   Jonetta Osgood, MD  vitamin E 200 UNIT capsule Take 200 Units by mouth daily.    [provider]    Iona Beard, MD Internal medicine, PGY-2 07/18/21 7:27 AM Pager [phone]

## 2021-07-18 NOTE — Progress Notes (Signed)
Subjective: Complaints per below.    Antibiotics:  Anti-infectives (From admission, onward)    Start     Dose/Rate Route Frequency Ordered Stop   07/18/21 1800  ceFAZolin (ANCEF) IVPB 1 g/50 mL premix        1 g 100 mL/hr over 30 Minutes Intravenous Every 24 hours 07/17/21 0829     07/18/21 1200  vancomycin (VANCOCIN) IVPB 1000 mg/200 mL premix  Status:  Discontinued        1,000 mg 200 mL/hr over 60 Minutes Intravenous Every M-W-F (Hemodialysis) 07/16/21 1339 07/17/21 0833   07/17/21 1400  ceFEPIme (MAXIPIME) 1 g in sodium chloride 0.9 % 100 mL IVPB  Status:  Discontinued        1 g 200 mL/hr over 30 Minutes Intravenous Every 24 hours 07/16/21 1752 07/17/21 0750   07/17/21 0915  ceFAZolin (ANCEF) IVPB 2g/100 mL premix        2 g 200 mL/hr over 30 Minutes Intravenous  Once 07/17/21 0825 07/17/21 1011   07/17/21 0845  nafcillin injection 2 g  Status:  Discontinued        2 g Intravenous Every 4 hours 07/17/21 0750 07/17/21 0823   07/16/21 1800  ceFEPIme (MAXIPIME) 2 g in sodium chloride 0.9 % 100 mL IVPB  Status:  Discontinued        2 g 200 mL/hr over 30 Minutes Intravenous Every M-W-F (1800) 07/16/21 1339 07/16/21 1752   07/16/21 1330  ceFEPIme (MAXIPIME) 2 g in sodium chloride 0.9 % 100 mL IVPB        2 g 200 mL/hr over 30 Minutes Intravenous  Once 07/16/21 1327 07/16/21 1446   07/16/21 1330  metroNIDAZOLE (FLAGYL) IVPB 500 mg        500 mg 100 mL/hr over 60 Minutes Intravenous  Once 07/16/21 1327 07/16/21 1619   07/16/21 1330  vancomycin (VANCOREADY) IVPB 2000 mg/400 mL        2,000 mg 200 mL/hr over 120 Minutes Intravenous  Once 07/16/21 1327 07/16/21 1831       Medications: Scheduled Meds:  (feeding supplement) PROSource Plus  30 mL Oral BID BM   aspirin EC  81 mg Oral Daily   atorvastatin  10 mg Oral q1800   Chlorhexidine Gluconate Cloth  6 each Topical Q0600   darbepoetin (ARANESP) injection - DIALYSIS  40 mcg Intravenous Q Wed-HD   insulin aspart   0-6 Units Subcutaneous Q4H   lanthanum  1,000 mg Oral TID WC   pantoprazole  40 mg Oral Daily   sodium chloride flush  10-40 mL Intracatheter Q12H   Continuous Infusions:  sodium chloride 250 mL (07/17/21 1811)   sodium chloride     sodium chloride      ceFAZolin (ANCEF) IV     heparin 1,600 Units/hr (07/18/21 0900)   PRN Meds:.sodium chloride, sodium chloride, acetaminophen, albuterol, alteplase, docusate sodium, heparin, lidocaine, lidocaine (PF), lidocaine-prilocaine, oxyCODONE, pentafluoroprop-tetrafluoroeth, polyethylene glycol, sodium chloride flush    Objective: Weight change: 2.981 kg  Intake/Output Summary (Last 24 hours) at 07/18/2021 1505 Last data filed at 07/18/2021 0900 Gross per 24 hour  Intake 653.07 ml  Output --  Net 653.07 ml   Blood pressure (!) 121/56, pulse 93, temperature 97.8 F (36.6 C), temperature source Oral, resp. rate 20, height 5\' 1"  (1.549 m), weight 93.7 kg, SpO2 98 %. Temp:  [97.8 F (36.6 C)-98.2 F (36.8 C)] 97.8 F (36.6 C) (07/20 1345) Pulse Rate:  [81-100] 93 (  07/20 1430) Resp:  [12-27] 20 (07/20 1430) BP: (82-178)/(34-151) 121/56 (07/20 1430) SpO2:  [87 %-100 %] 98 % (07/20 1345) Weight:  [93.7 kg] 93.7 kg (07/20 1345)  Physical Exam: Physical Exam Constitutional:      General: She is not in acute distress.    Appearance: Normal appearance. She is obese. She is not ill-appearing or toxic-appearing.  Eyes:     General: No scleral icterus.       Right eye: No discharge.        Left eye: No discharge.     Conjunctiva/sclera: Conjunctivae normal.  Cardiovascular:     Rate and Rhythm: Normal rate and regular rhythm.     Heart sounds: No murmur heard. Pulmonary:     Effort: Pulmonary effort is normal.     Breath sounds: Normal breath sounds. No stridor. No wheezing or rales.  Abdominal:     General: Bowel sounds are normal. There is no distension.     Palpations: Abdomen is soft.     Tenderness: There is no abdominal tenderness.   Musculoskeletal:        General: Swelling present.     Comments: L 1-3 fingers w/ mild swelling. Cap refill <2 sec. No decreased sensation. No discoloration  Skin:    General: Skin is warm and dry.     Findings: Rash present.     Comments: Rash of ble L>R. Discrete macules. Non TTP non erythematous.  Small blisters around R tunneled line catheter in distribution of dressing. Minimal skin breakdown  Neurological:     Mental Status: She is alert.     BMET Recent Labs    07/16/21 1402 07/17/21 0408  NA 132* 131*  K 4.8 4.9  CL 94* 101  CO2 19* 15*  GLUCOSE 99 131*  BUN 24* 30*  CREATININE 7.31* 7.20*  CALCIUM 8.2* 7.3*     Liver Panel  Recent Labs    07/16/21 1402  PROT 7.8  ALBUMIN 2.7*  AST 39  ALT 21  ALKPHOS 93  BILITOT 1.2     Micro Results: Recent Results (from the past 720 hour(s))  Blood Culture (routine x 2)     Status: None (Preliminary result)   Collection Time: 07/16/21  1:15 PM   Specimen: BLOOD  Result Value Ref Range Status   Specimen Description BLOOD BLOOD RIGHT FOREARM  Final   Special Requests   Final    BOTTLES DRAWN AEROBIC AND ANAEROBIC Blood Culture adequate volume   Culture   Final    NO GROWTH 2 DAYS Performed at [facility], [place] [address] [place], [place] [address]    Report Status PENDING  Incomplete  Resp Panel by RT-PCR (Flu A&B, Covid) Nasopharyngeal Swab     Status: None   Collection Time: 07/16/21  1:17 PM   Specimen: Nasopharyngeal Swab; Nasopharyngeal(NP) swabs in vial transport medium  Result Value Ref Range Status   SARS Coronavirus 2 by RT PCR NEGATIVE NEGATIVE Final    Comment: (NOTE) SARS-CoV-2 target nucleic acids are NOT DETECTED.  The SARS-CoV-2 RNA is generally detectable in upper respiratory specimens during the acute phase of infection. The lowest concentration of SARS-CoV-2 viral copies this assay can detect is 138 copies/mL. A negative result does not preclude SARS-Cov-2 infection and should not  be used as the sole basis for treatment or other patient management decisions. A negative result may occur with  improper specimen collection/handling, submission of specimen other than nasopharyngeal swab, presence of viral  mutation(s) within the areas targeted by this assay, and inadequate number of viral copies(<138 copies/mL). A negative result must be combined with clinical observations, patient history, and epidemiological information. The expected result is Negative.  Fact Sheet for Patients:  EntrepreneurPulse.com.au  Fact Sheet for Healthcare Providers:  IncredibleEmployment.be  This test is no t yet approved or cleared by the [place] FDA and  has been authorized for detection and/or diagnosis of SARS-CoV-2 by FDA under an Emergency Use Authorization (EUA). This EUA will remain  in effect (meaning this test can be used) for the duration of the COVID-19 declaration under Section 564(b)(1) of the Act, 21 U.S.C.section 360bbb-3(b)(1), unless the authorization is terminated  or revoked sooner.       Influenza A by PCR NEGATIVE NEGATIVE Final   Influenza B by PCR NEGATIVE NEGATIVE Final    Comment: (NOTE) The Xpert Xpress SARS-CoV-2/FLU/RSV plus assay is intended as an aid in the diagnosis of influenza from Nasopharyngeal swab specimens and should not be used as a sole basis for treatment. Nasal washings and aspirates are unacceptable for Xpert Xpress SARS-CoV-2/FLU/RSV testing.  Fact Sheet for Patients: EntrepreneurPulse.com.au  Fact Sheet for Healthcare Providers: IncredibleEmployment.be  This test is not yet approved or cleared by the [place] FDA and has been authorized for detection and/or diagnosis of SARS-CoV-2 by FDA under an Emergency Use Authorization (EUA). This EUA will remain in effect (meaning this test can be used) for the duration of the COVID-19 declaration under Section  564(b)(1) of the Act, 21 U.S.C. section 360bbb-3(b)(1), unless the authorization is terminated or revoked.  Performed at [facility], [place] [address], [place], [place] [address]   Blood Culture (routine x 2)     Status: Abnormal (Preliminary result)   Collection Time: 07/16/21  2:04 PM   Specimen: BLOOD RIGHT WRIST  Result Value Ref Range Status   Specimen Description BLOOD RIGHT WRIST  Final   Special Requests   Final    BOTTLES DRAWN AEROBIC AND ANAEROBIC Blood Culture results may not be optimal due to an inadequate volume of blood received in culture bottles   Culture  Setup Time   Final    GRAM POSITIVE COCCI IN CLUSTERS IN BOTH AEROBIC AND ANAEROBIC BOTTLES CRITICAL RESULT CALLED TO, READ BACK BY AND VERIFIED WITH: C. AMEND PHARMD, AT 9678 07/17/21 D. VANHOOK    Culture (A)  Final    STAPHYLOCOCCUS AUREUS SUSCEPTIBILITIES TO FOLLOW Performed at [facility], [place] [address], [place], [place] [address]    Report Status PENDING  Incomplete  Blood Culture ID Panel (Reflexed)     Status: Abnormal   Collection Time: 07/16/21  2:04 PM  Result Value Ref Range Status   Enterococcus faecalis NOT DETECTED NOT DETECTED Final   Enterococcus Faecium NOT DETECTED NOT DETECTED Final   Listeria monocytogenes NOT DETECTED NOT DETECTED Final   Staphylococcus species DETECTED (A) NOT DETECTED Final    Comment: CRITICAL RESULT CALLED TO, READ BACK BY AND VERIFIED WITH: C. AMEND PHARMD, AT 0450 07/17/21 D. VANHOOK    Staphylococcus aureus (BCID) DETECTED (A) NOT DETECTED Final    Comment: CRITICAL RESULT CALLED TO, READ BACK BY AND VERIFIED WITH: C. AMEND PHARMD, AT 1751 07/17/21 D. VANHOOK    Staphylococcus epidermidis NOT DETECTED NOT DETECTED Final   Staphylococcus lugdunensis NOT DETECTED NOT DETECTED Final   Streptococcus species NOT DETECTED NOT DETECTED Final   Streptococcus agalactiae NOT DETECTED NOT DETECTED Final   Streptococcus pneumoniae NOT DETECTED  NOT DETECTED Final    Streptococcus pyogenes NOT DETECTED NOT DETECTED Final   A.calcoaceticus-baumannii NOT DETECTED NOT DETECTED Final   Bacteroides fragilis NOT DETECTED NOT DETECTED Final   Enterobacterales NOT DETECTED NOT DETECTED Final   Enterobacter cloacae complex NOT DETECTED NOT DETECTED Final   Escherichia coli NOT DETECTED NOT DETECTED Final   Klebsiella aerogenes NOT DETECTED NOT DETECTED Final   Klebsiella oxytoca NOT DETECTED NOT DETECTED Final   Klebsiella pneumoniae NOT DETECTED NOT DETECTED Final   Proteus species NOT DETECTED NOT DETECTED Final   Salmonella species NOT DETECTED NOT DETECTED Final   Serratia marcescens NOT DETECTED NOT DETECTED Final   Haemophilus influenzae NOT DETECTED NOT DETECTED Final   Neisseria meningitidis NOT DETECTED NOT DETECTED Final   Pseudomonas aeruginosa NOT DETECTED NOT DETECTED Final   Stenotrophomonas maltophilia NOT DETECTED NOT DETECTED Final   Candida albicans NOT DETECTED NOT DETECTED Final   Candida auris NOT DETECTED NOT DETECTED Final   Candida glabrata NOT DETECTED NOT DETECTED Final   Candida krusei NOT DETECTED NOT DETECTED Final   Candida parapsilosis NOT DETECTED NOT DETECTED Final   Candida tropicalis NOT DETECTED NOT DETECTED Final   Cryptococcus neoformans/gattii NOT DETECTED NOT DETECTED Final   Meth resistant mecA/C and MREJ NOT DETECTED NOT DETECTED Final    Comment: Performed at [facility], [address] [place], [place] [address]  Gastrointestinal Panel by PCR , Stool     Status: None   Collection Time: 07/16/21  3:08 PM   Specimen: STOOL  Result Value Ref Range Status   Campylobacter species NOT DETECTED NOT DETECTED Final   Plesimonas shigelloides NOT DETECTED NOT DETECTED Final   Salmonella species NOT DETECTED NOT DETECTED Final   Yersinia enterocolitica NOT DETECTED NOT DETECTED Final   Vibrio species NOT DETECTED NOT DETECTED Final   Vibrio cholerae NOT DETECTED NOT DETECTED Final   Enteroaggregative E coli  (EAEC) NOT DETECTED NOT DETECTED Final   Enteropathogenic E coli (EPEC) NOT DETECTED NOT DETECTED Final   Enterotoxigenic E coli (ETEC) NOT DETECTED NOT DETECTED Final   Shiga like toxin producing E coli (STEC) NOT DETECTED NOT DETECTED Final   Shigella/Enteroinvasive E coli (EIEC) NOT DETECTED NOT DETECTED Final   Cryptosporidium NOT DETECTED NOT DETECTED Final   Cyclospora cayetanensis NOT DETECTED NOT DETECTED Final   Entamoeba histolytica NOT DETECTED NOT DETECTED Final   Giardia lamblia NOT DETECTED NOT DETECTED Final   Adenovirus F40/41 NOT DETECTED NOT DETECTED Final   Astrovirus NOT DETECTED NOT DETECTED Final   Norovirus GI/GII NOT DETECTED NOT DETECTED Final   Rotavirus A NOT DETECTED NOT DETECTED Final   Sapovirus (I, II, IV, and V) NOT DETECTED NOT DETECTED Final    Comment: Performed at [facility], [address] [place], [place] [address]  C Difficile Quick Screen w PCR reflex     Status: None   Collection Time: 07/16/21  3:08 PM   Specimen: STOOL  Result Value Ref Range Status   C Diff antigen NEGATIVE NEGATIVE Final   C Diff toxin NEGATIVE NEGATIVE Final   C Diff interpretation No C. difficile detected.  Final    Comment: Performed at [facility], [place] [address] [place],  [address]  Culture, blood (Routine X 2) w Reflex to ID Panel     Status: None (Preliminary result)   Collection Time: 07/17/21  8:45 AM   Specimen: BLOOD RIGHT ARM  Result Value Ref Range Status   Specimen  Description BLOOD RIGHT ARM  Final   Special Requests   Final    BOTTLES DRAWN AEROBIC AND ANAEROBIC Blood Culture adequate volume   Culture   Final    NO GROWTH < 24 HOURS Performed at [facility], [address], [place], [place] [address]    Report Status PENDING  Incomplete  Culture, blood (Routine X 2) w Reflex to ID Panel     Status: None (Preliminary result)   Collection Time: 07/17/21  8:55 AM   Specimen: BLOOD LEFT HAND  Result Value Ref Range Status    Specimen Description BLOOD LEFT HAND  Final   Special Requests   Final    BOTTLES DRAWN AEROBIC AND ANAEROBIC Blood Culture adequate volume   Culture   Final    NO GROWTH < 24 HOURS Performed at [facility], [place] [address], [place], [place] [address]    Report Status PENDING  Incomplete    Studies/Results: MR LUMBAR SPINE WO CONTRAST  Result Date: 07/18/2021 CLINICAL DATA:  Epidural abscess suspected. Additional provided: Bilateral flank pain, worsening for several weeks, dialysis patient, patient unable to walk due to pain. EXAM: MRI LUMBAR SPINE WITHOUT CONTRAST TECHNIQUE: Multiplanar, multisequence MR imaging of the lumbar spine was performed. No intravenous contrast was administered. COMPARISON:   IMPRESSION: No specific findings to suggest discitis/osteomyelitis at this time. No evidence of epidural abscess on this non-contrast study.   Schmorl nodes within the T12 inferior endplate, L1 inferior endplate, L3 superior endplate and L3 inferior endplate. There is mild marrow edema surrounding the Schmorl node within the T12 inferior endplate, and this Schmorl node may be acute/subacute. Minimal edema elsewhere, as described and degenerative in appearance.   Lumbar spondylosis most notable L4-S1.   ECHOCARDIOGRAM COMPLETE  Result Date: 07/17/2021    ECHOCARDIOGRAM REPORT   Patient Name:   [facility] Date of Exam: 07/17/2021 Medical Rec #:  [id number]            Height:       61.0 in Accession #:    [id number]           Weight:       207.2 lb Date of Birth:  06/26/1956             BSA:          1.917 m Patient Age:    32 years             BP:           137/58 mmHg Patient Gender: F                    HR:           89 bpm.   1. Left ventricular ejection fraction, by estimation, is 70 to 75%. The left ventricle has hyperdynamic function. The left ventricle has no regional wall motion abnormalities. Left ventricular diastolic parameters are consistent with Grade I  diastolic dysfunction (impaired relaxation). Elevated left atrial pressure. 2. Right ventricular systolic function is normal. The right ventricular size is normal. The estimated right ventricular systolic pressure is 32.2 mmHg. 3. Left atrial size was mildly dilated. 4. The mitral valve is normal in structure. No evidence of mitral valve regurgitation. Moderate mitral annular calcification. 5. The aortic valve is tricuspid. There is mild calcification of the aortic valve. There is mild thickening of the aortic valve. Aortic valve regurgitation is not visualized. Mild to moderate aortic valve sclerosis/calcification is  present, without any evidence of aortic stenosis. Aortic valve mean gradient measures 14.0 mmHg. Aortic valve Vmax measures 2.52 m/s. 6. The inferior vena cava is normal in size with greater than 50% respiratory variability, suggesting right atrial pressure of 3 mmHg. Comparison(s): Prior images unable to be directly viewed, comparison made by report only. Conclusion(s)/Recommendation(s): No evidence of valvular vegetations on this transthoracic echocardiogram. Would recommend a transesophageal echocardiogram to exclude infective endocarditis if clinically indicated.  Assessment/Plan:  INTERVAL HISTORY:  Patient reports she has not had chest pain that was thought to be MSK related since the night of her admission. She did develop some blistering along the bandage site of her R sided tunneled line. She believes this is due to the adhesive. She also continues to have finger numbness and tingling on her left side. Her back pain that she presented with is improved.   Active Problems:   ESRD (end stage renal disease) ([place])   Severe sepsis without septic shock ([place])   MSSA bacteremia   Acute low back pain    Rozalynn Buege is a 65 y.o. 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  MSSA Bacteremia: Likely 2/2 R tunneled line. Bcx 2/4 7/18 positive for MSSA. Bcx 7/20 pending. Bcx 7/19 4/4 NGTD. TTE with no evidence of vegetation. Patient had lumbar back pain on presentation. MRI of the lumbar spine showed no evidence of discitis, osteomyelitis, or epidural abscess.  -F/u blood cultures -Continue Ancef -Patient will have line removed after HD and a line holiday until 7/22.    ESRD: HD per nephrology.    LOS: 2 days   Althea Grimmer 07/18/2021, 3:05 PM

## 2021-07-18 NOTE — Progress Notes (Signed)
Patient ID: Whitney Walker, female   DOB: [birth year], 65 y.o.   MRN: [id number] S: Feels better, off pressors. O:BP 109/62   Pulse 95   Temp 97.9 F (36.6 C) (Oral)   Resp (!) 25   Ht 5\' 1"  (1.549 m)   Wt 93.7 kg   SpO2 95%   BMI 39.03 kg/m   Intake/Output Summary (Last 24 hours) at 07/18/2021 1047 Last data filed at 07/18/2021 0900 Gross per 24 hour  Intake 890.37 ml  Output --  Net 890.37 ml   Intake/Output: I/O last 3 completed shifts: In: 2574.9 [P.O.:540; I.V.:1934.9; IV Piggyback:99.9] Out: -   Intake/Output this shift:  Total I/O In: 31.7 [I.V.:31.7] Out: -  Weight change: 2.981 kg Gen:NAD CVS: RRR Resp: CTA Abd: benign Ext: no edema  Recent Labs  Lab 07/16/21 1402 07/17/21 0408  NA 132* 131*  K 4.8 4.9  CL 94* 101  CO2 19* 15*  GLUCOSE 99 131*  BUN 24* 30*  CREATININE 7.31* 7.20*  ALBUMIN 2.7*  --   CALCIUM 8.2* 7.3*  PHOS  --  6.5*  AST 39  --   ALT 21  --    Liver Function Tests: Recent Labs  Lab 07/16/21 1402  AST 39  ALT 21  ALKPHOS 93  BILITOT 1.2  PROT 7.8  ALBUMIN 2.7*   No results for input(s): LIPASE, AMYLASE in the last 168 hours. No results for input(s): AMMONIA in the last 168 hours. CBC: Recent Labs  Lab 07/16/21 1402 07/17/21 0408 07/18/21 0341  WBC 26.9* 29.1* 20.4*  NEUTROABS 23.8*  --   --   HGB 11.9* 10.7* 8.1*  HCT 37.8 34.8* 25.6*  MCV 92.2 94.3 92.4  PLT 143* 146* 159   Cardiac Enzymes: No results for input(s): CKTOTAL, CKMB, CKMBINDEX, TROPONINI in the last 168 hours. CBG: Recent Labs  Lab 07/17/21 1511 07/17/21 1938 07/17/21 2336 07/18/21 0338 07/18/21 0739  GLUCAP 97 156* 120* 115* 110*    Iron Studies: No results for input(s): IRON, TIBC, TRANSFERRIN, FERRITIN in the last 72 hours. Studies/Results: US Abdomen Complete  Result Date: 07/17/2021 CLINICAL DATA:  Septic shock EXAM: ABDOMEN ULTRASOUND COMPLETE COMPARISON:  CT 07/16/2021 FINDINGS: Gallbladder: Absent Common bile duct: Diameter: 5  mm in proximal diameter Liver: No focal lesion identified. Within normal limits in parenchymal echogenicity. Portal vein is patent on color Doppler imaging with normal direction of blood flow towards the liver. IVC: No abnormality visualized. Pancreas: Visualized portion unremarkable. Spleen: Size and appearance within normal limits. Right Kidney: Length: 7.8 cm. Renal cortical echogenicity is within normal limits. Mild to moderate renal cortical atrophy is noted. The 2 cortical lesions in question noted on prior CT examination represents simple cysts measuring 2.9 and 2.5 cm in greatest dimension. No solid intrarenal masses are seen. No intrarenal calcifications are seen. There is no hydronephrosis. Left Kidney: Length: 7.7 cm. Renal cortical echogenicity is within normal limits. Mild to moderate cortical atrophy is noted. No hydronephrosis. No intrarenal masses or calcifications are seen. Abdominal aorta: No aneurysm visualized. Other findings: None. IMPRESSION: Status post cholecystectomy. Mild to moderate bilateral renal cortical atrophy. Indeterminate cortical lesions noted within the right kidney on prior CT examination represents simple cortical cyst. Further follow-up is not required. Electronically Signed   By: Fidela Salisbury MD   On: 07/17/2021 03:09   DG Chest Port 1 View  Result Date: 07/17/2021 CLINICAL DATA:  Respiratory failure. EXAM: PORTABLE CHEST 1 VIEW COMPARISON:  CT 07/16/2021.  Chest x-ray 07/16/2021.  FINDINGS: Dialysis catheter stable position. Cardiomegaly with pulmonary venous congestion noted. Low lung volumes with mild bibasilar atelectasis. No focal infiltrate or edema. No prominent pleural effusion. No pneumothorax. Reference is made to recent chest CT report for discussion of pulmonary nodules present. No acute bony abnormality identified. IMPRESSION: 1.  Dialysis catheter stable position. 2. Cardiomegaly with pulmonary venous congestion. Low lung volumes with mild bibasilar  atelectasis. No focal infiltrate or edema. Electronically Signed   By: Marcello Moores  Register   On: 07/17/2021 06:26   DG Chest Port 1 View  Result Date: 07/16/2021 CLINICAL DATA:  Questionable sepsis EXAM: PORTABLE CHEST 1 VIEW COMPARISON:  May 28, 2021 FINDINGS: Right internal jugular dual lumen tunneled central venous catheter in stable position. The heart size and mediastinal contours are within normal limits. Streaky bibasilar opacities. No visible pleural effusion or pneumothorax. The visualized skeletal structures are unremarkable. IMPRESSION: Streaky bibasilar opacities, favor atelectasis although developing infection would not be excluded. Electronically Signed   By: Dahlia Bailiff MD   On: 07/16/2021 13:59   ECHOCARDIOGRAM COMPLETE  Result Date: 07/17/2021    ECHOCARDIOGRAM REPORT   Patient Name:   Whitney Walker Date of Exam: 07/17/2021 Medical Rec #:  [id number]            Height:       61.0 in Accession #:    [id number]           Weight:       207.2 lb Date of Birth:  [birth year]             BSA:          1.917 m Patient Age:    28 years             BP:           137/58 mmHg Patient Gender: F                    HR:           89 bpm. Exam Location:  Inpatient Procedure: 2D Echo, Cardiac Doppler and Color Doppler Indications:    Endocarditis  History:        Patient has prior history of Echocardiogram examinations, most                 recent 04/08/2021. CHF, CAD, COPD; Risk Factors:Hypertension and                 Diabetes.  Sonographer:    Bernadene Person RDCS Referring Phys: [id number] Kipp Brood  Sonographer Comments: Image acquisition challenging due to uncooperative patient. IMPRESSIONS  1. Left ventricular ejection fraction, by estimation, is 70 to 75%. The left ventricle has hyperdynamic function. The left ventricle has no regional wall motion abnormalities. Left ventricular diastolic parameters are consistent with Grade I diastolic dysfunction (impaired relaxation). Elevated left atrial  pressure.  2. Right ventricular systolic function is normal. The right ventricular size is normal. The estimated right ventricular systolic pressure is 22.0 mmHg.  3. Left atrial size was mildly dilated.  4. The mitral valve is normal in structure. No evidence of mitral valve regurgitation. Moderate mitral annular calcification.  5. The aortic valve is tricuspid. There is mild calcification of the aortic valve. There is mild thickening of the aortic valve. Aortic valve regurgitation is not visualized. Mild to moderate aortic valve sclerosis/calcification is present, without any evidence of aortic stenosis. Aortic valve mean gradient measures 14.0 mmHg. Aortic valve Vmax measures 2.52 m/s.  6. The inferior vena cava is normal in size with greater than 50% respiratory variability, suggesting right atrial pressure of 3 mmHg. Comparison(s): Prior images unable to be directly viewed, comparison made by report only. Conclusion(s)/Recommendation(s): No evidence of valvular vegetations on this transthoracic echocardiogram. Would recommend a transesophageal echocardiogram to exclude infective endocarditis if clinically indicated. FINDINGS  Left Ventricle: Left ventricular ejection fraction, by estimation, is 70 to 75%. The left ventricle has hyperdynamic function. The left ventricle has no regional wall motion abnormalities. The left ventricular internal cavity size was normal in size. There is no left ventricular hypertrophy. Left ventricular diastolic parameters are consistent with Grade I diastolic dysfunction (impaired relaxation). Elevated left atrial pressure. Right Ventricle: The right ventricular size is normal. No increase in right ventricular wall thickness. Right ventricular systolic function is normal. The tricuspid regurgitant velocity is 3.15 m/s, and with an assumed right atrial pressure of 3 mmHg, the estimated right ventricular systolic pressure is 93.8 mmHg. Left Atrium: Left atrial size was mildly dilated.  Right Atrium: Right atrial size was normal in size. Pericardium: There is no evidence of pericardial effusion. Mitral Valve: The mitral valve is normal in structure. Moderate mitral annular calcification. No evidence of mitral valve regurgitation. Tricuspid Valve: The tricuspid valve is normal in structure. Tricuspid valve regurgitation is trivial. Aortic Valve: Borderline criteria for aortic valve stenosis, gradients are exaggerated by hyperdynamic LV function. The aortic valve is tricuspid. There is mild calcification of the aortic valve. There is mild thickening of the aortic valve. Aortic valve  regurgitation is not visualized. Mild to moderate aortic valve sclerosis/calcification is present, without any evidence of aortic stenosis. Aortic valve mean gradient measures 14.0 mmHg. Aortic valve peak gradient measures 25.5 mmHg. Aortic valve area, by VTI measures 1.77 cm. Pulmonic Valve: The pulmonic valve was grossly normal. Pulmonic valve regurgitation is mild. Aorta: The aortic root is normal in size and structure. Venous: The inferior vena cava is normal in size with greater than 50% respiratory variability, suggesting right atrial pressure of 3 mmHg. IAS/Shunts: No atrial level shunt detected by color flow Doppler.  LEFT VENTRICLE PLAX 2D LVIDd:         4.10 cm  Diastology LVIDs:         2.50 cm  LV e' medial:    7.13 cm/s LV PW:         1.10 cm  LV E/e' medial:  16.8 LV IVS:        1.10 cm  LV e' lateral:   6.60 cm/s LVOT diam:     2.10 cm  LV E/e' lateral: 18.2 LV SV:         90 LV SV Index:   47 LVOT Area:     3.46 cm  RIGHT VENTRICLE RV S prime:     11.90 cm/s TAPSE (M-mode): 1.9 cm LEFT ATRIUM             Index       RIGHT ATRIUM          Index LA diam:        3.80 cm 1.98 cm/m  RA Area:     9.72 cm LA Vol (A2C):   49.5 ml 25.82 ml/m RA Volume:   16.80 ml 8.76 ml/m LA Vol (A4C):   41.2 ml 21.49 ml/m LA Biplane Vol: 46.5 ml 24.25 ml/m  AORTIC VALVE AV Area (Vmax):    1.96 cm AV Area (Vmean):   1.91  cm AV Area (VTI):  1.77 cm AV Vmax:           252.25 cm/s AV Vmean:          171.500 cm/s AV VTI:            0.510 m AV Peak Grad:      25.5 mmHg AV Mean Grad:      14.0 mmHg LVOT Vmax:         143.00 cm/s LVOT Vmean:        94.500 cm/s LVOT VTI:          0.261 m LVOT/AV VTI ratio: 0.51  AORTA Ao Root diam: 2.70 cm Ao Asc diam:  2.90 cm MITRAL VALVE                TRICUSPID VALVE MV Area (PHT): 2.62 cm     TR Peak grad:   39.7 mmHg MV Decel Time: 289 msec     TR Vmax:        315.00 cm/s MV E velocity: 120.00 cm/s MV A velocity: 151.00 cm/s  SHUNTS MV E/A ratio:  0.79         Systemic VTI:  0.26 m                             Systemic Diam: 2.10 cm Sanda Klein MD Electronically signed by Sanda Klein MD Signature Date/Time: 07/17/2021/2:02:07 PM    Final    [place] CHEST SOFT TISSUE  Result Date: 07/17/2021 CLINICAL DATA:  Evaluate AV fistula abscess. Fever and bacteremia with painful catheter EXAM: ULTRASOUND OF right chest SOFT TISSUES TECHNIQUE: Ultrasound examination was performed in the area of clinical concern. COMPARISON:  None similar FINDINGS: Dialysis catheter in the right chest which had an unremarkable appearance on recent chest x-ray. There is fluid encompassing the catheter along the upper aspect of the subcutaneous portion collection measuring 3-4 mm in maximal thickness. No separate abscess is seen. IMPRESSION: Perma catheter on the right. A thin amount of fluid encompasses the upper subcutaneous catheter, history suggesting purulence. Electronically Signed   By: Monte Fantasia M.D.   On: 07/17/2021 11:38   CT CHEST ABDOMEN PELVIS WO CONTRAST  Result Date: 07/16/2021 CLINICAL DATA:  Respiratory illness, nondiagnostic xray. Respiratory illness/flank pain EXAM: CT CHEST, ABDOMEN AND PELVIS WITHOUT CONTRAST TECHNIQUE: Multidetector CT imaging of the chest, abdomen and pelvis was performed following the standard protocol without IV contrast. COMPARISON:  CT chest 02/06/2021, CT cardiac  02/27/2021, CT abdomen pelvis 05/18/2012 FINDINGS: CT CHEST FINDINGS Cardiovascular: Normal heart size. No significant pericardial effusion. The thoracic aorta is normal in caliber. At least mild atherosclerotic plaque of the thoracic aorta. Four-vessel coronary artery calcifications. Mitral annular calcifications. The main pulmonary artery enlarged in caliber. Mediastinum/Nodes: No gross hilar adenopathy, noting limited sensitivity for the detection of hilar adenopathy on this noncontrast study. No enlarged mediastinal or axillary lymph nodes. Thyroid gland, trachea, and esophagus demonstrate no significant findings. Possibly tiny hiatal hernia. Lungs/Pleura: Bilateral lower lobe subsegmental atelectasis. Slightly more conspicuous but grossly stable in size triangular subpleural 6 mm pulmonary nodule within the right upper lobe likely representing an intrapulmonary lymph node (4:59). Stable 5 mm left lower lobe pulmonary nodule (4:107). No new pulmonary nodule. No pulmonary mass. No focal consolidation. Musculoskeletal: No chest wall abnormality. No suspicious lytic or blastic osseous lesions. No acute displaced fracture. Multilevel degenerative changes of the spine. CT ABDOMEN PELVIS FINDINGS Hepatobiliary: No focal liver abnormality is seen. Status post  cholecystectomy. No biliary dilatation. Pancreas: No focal lesion. Normal pancreatic contour. No surrounding inflammatory changes. No main pancreatic ductal dilatation. Spleen: Normal in size without focal abnormality. Several splenules noted within the left upper lobe with the largest 1 measuring up to 4 cm. Adrenals/Urinary Tract: No adrenal nodule bilaterally. Bilateral renal cortical scarring. No nephrolithiasis, no hydronephrosis. There is a 2.3 cm left renal fluid density lesion that likely represents a simple renal cyst. Slight interval increase in size of two low-density slightly heterogeneous right renal lesions measuring 2.7 and 2.3 cm demonstrating  indeterminate densities of 22 and 26 Hounsfield units. Subcentimeter hypodensities are too small to characterize. Otherwise no contour-deforming renal mass. No ureterolithiasis or hydroureter. The urinary bladder is decompressed and grossly unremarkable. Stomach/Bowel: Partial colectomy stomach is within normal limits. No evidence of bowel wall thickening or dilatation. Vascular/Lymphatic: No abdominal aorta or iliac aneurysm. Severe atherosclerotic plaque of the aorta and its branches. No abdominal, pelvic, or inguinal lymphadenopathy. Reproductive: Status post hysterectomy. No adnexal masses. Other: No intraperitoneal free fluid. No intraperitoneal free gas. No organized fluid collection. Musculoskeletal: Small fat containing umbilical hernia. Healed anterior abdominal incision. No suspicious lytic or blastic osseous lesions. No acute displaced fracture. Multilevel degenerative changes of the spine. IMPRESSION: 1. Slightly more conspicuous but grossly stable in size 6 mm triangular right upper lobe subpleural nodule. Stable 5 mm left lower lobe pulmonary nodule. No new pulmonary nodule or mass. Given stability, recommend CT at 12-32months is considered optional for low-risk patients, but is recommended for high-risk patients. This recommendation follows the consensus statement: Guidelines for Management of Incidental Pulmonary Nodules Detected on CT Images: From the Fleischner Society 2017; Radiology 2017; 284:228-243. 2. Enlarged main pulmonary artery suggestive of pulmonary hypertension. 3. Interval increase in size of two indeterminate slightly heterogeneous 2.3cm and 2.7 cm right renal lesions. Recommend MRI renal protocol for further evaluation. 4. Aortic Atherosclerosis (ICD10-I70.0) including four-vessel coronary artery calcifications and mitral annular calcifications. 5. Please note limited evaluation due to noncontrast study. Electronically Signed   By: Iven Finn M.D.   On: 07/16/2021 16:50     (feeding supplement) PROSource Plus  30 mL Oral BID BM   aspirin EC  81 mg Oral Daily   Chlorhexidine Gluconate Cloth  6 each Topical Q0600   insulin aspart  0-6 Units Subcutaneous Q4H   lanthanum  1,000 mg Oral TID WC   pantoprazole  40 mg Oral Daily   sodium chloride flush  10-40 mL Intracatheter Q12H    BMET    Component Value Date/Time   NA 131 (L) 07/17/2021 0408   NA 135 04/25/2021 1128   NA 138 07/07/2017 1235   K 4.9 07/17/2021 0408   K 4.5 07/07/2017 1235   CL 101 07/17/2021 0408   CL 111 (H) 12/04/2012 1056   CO2 15 (L) 07/17/2021 0408   CO2 17 (L) 07/07/2017 1235   GLUCOSE 131 (H) 07/17/2021 0408   GLUCOSE 85 07/07/2017 1235   GLUCOSE 97 12/04/2012 1056   BUN 30 (H) 07/17/2021 0408   BUN 13 04/25/2021 1128   BUN 40.8 (H) 07/07/2017 1235   CREATININE 7.20 (H) 07/17/2021 0408   CREATININE 3.4 (HH) 07/07/2017 1235   CALCIUM 7.3 (L) 07/17/2021 0408   CALCIUM 9.5 07/07/2017 1235   GFRNONAA 6 (L) 07/17/2021 0408   GFRAA 7 (L) 05/18/2020 1036   CBC    Component Value Date/Time   WBC 20.4 (H) 07/18/2021 0341   RBC 2.77 (L) 07/18/2021 0341   HGB 8.1 (  L) 07/18/2021 0341   HGB 11.8 04/25/2021 1128   HGB 10.9 (L) 07/07/2017 1235   HCT 25.6 (L) 07/18/2021 0341   HCT 34.6 04/25/2021 1128   HCT 35.0 07/07/2017 1235   PLT 159 07/18/2021 0341   PLT 183 04/25/2021 1128   MCV 92.4 07/18/2021 0341   MCV 89 04/25/2021 1128   MCV 85.2 07/07/2017 1235   MCH 29.2 07/18/2021 0341   MCHC 31.6 07/18/2021 0341   RDW 16.3 (H) 07/18/2021 0341   RDW 15.7 (H) 04/25/2021 1128   RDW 15.8 (H) 07/07/2017 1235   LYMPHSABS 1.1 07/16/2021 1402   LYMPHSABS 2.4 07/07/2017 1235   MONOABS 1.6 (H) 07/16/2021 1402   MONOABS 0.8 07/07/2017 1235   EOSABS 0.0 07/16/2021 1402   EOSABS 0.2 07/07/2017 1235   BASOSABS 0.1 07/16/2021 1402   BASOSABS 0.0 07/07/2017 1235    Dialysis Orders:  MWF at [facility] 3:30hr, 400/A1.5, EDW 90.5kg, 2K/2.5Ca, UFP #2, TDC, heparin 6000 - Hectoral  61mcg IV q HD - No ESA, Hgb > 11   Assessment/Plan:  Sepsis/MSSA bacteremia (1 of 2 Blood Cx +): Repeat Cx pending, initially on Vanc/Cefepime/Flagyl -> now narrowed to Cefazolin. CXR clear. Currently off of pressors.  Will plan to remove [facility] today after HD and follow.  To have MRI of LS spine to r/o septic emboli or abscess per PCCM.  Holding off on TEE for now given marked improvement.  ESRD:  Continue HD per usual MWF schedule - for HD today in unit then dc [facility] for line holiday.  Pt requests sedation prior to removal and eventual replacement of TDC due to past bad experience.  Hypotension/volume: Off pressors at this time, home BP meds on hold. 4kg above dry weight, UF as tolerated with HD.  Anemia: Hgb dropped will resume ESA  Metabolic bone disease: CorrCa ok, Phos high - resume home binders.  Nutrition: Alb low, will add supplements.  Hx DVT/PE: Eliquis on hold, on hep drip for now  Donetta Potts, MD [employer] [phone]

## 2021-07-18 NOTE — Progress Notes (Signed)
Attempted report x1. 

## 2021-07-18 NOTE — Plan of Care (Signed)
  Problem: Health Behavior/Discharge Planning: Goal: Ability to manage health-related needs will improve Outcome: Progressing   

## 2021-07-18 NOTE — Progress Notes (Signed)
[facility] for Infectious Disease  Date of Admission:  07/16/2021     CC: Mssa bacteremia  Lines: Chronic right chest tunneled hd catheter Nonfunctioning LUE native AVF (no recent access for the past year)  Abx: 7/19-c cefazolin  ASSESSMENT: #mssa bacteremia  #lumbar back pain Duration of sx 2 weeks/community onset likely hd catheter beign source. Nonfunctioning native avf clinically doesn't appear infected 7/18 bcx positive 7/19 and 7/20 bcx ngtd 7/19 tte no veg LLE rash onset late 04/2019 doesn't appear to be staph aureus bsi related  -await mri l-spine -f/u bcx -continue cefazolin -no need for tee at this time -discussed with primary team  #esrd  -can remove right chest tunneled cath today after dialysis and give a line holiday until Friday 7/22. Placement of new catheter then ok if bcx remains negative from 7/20    I spent more than 35 minute reviewing data/chart, and coordinating care and >50% direct face to face time providing counseling/discussing diagnostics/treatment plan with patient     Active Problems:   ESRD (end stage renal disease) ([place])   Severe sepsis without septic shock (H[facility])   MSSA bacteremia   Allergies  Allergen Reactions   Carnosine     Other reaction(s): Unknown   Gadolinium Derivatives Hives and Other (See Comments)    HIVES, Desc: HIVES W/ "DYE" USED FOR 1ST CT SCAN BUT NOT 2ND, NO PREMEDS USED, PT UNCERTAIN OF CIRCUMSTANCES,,?POSSIBLE MRI CONTRAST ALLERGY, ALL STUDIES DONE "SOMEWHERE" IN [place]//A.C., Onset Date: [id number]   Iohexol Other (See Comments)     Code: HIVES, Desc: HIVES W/ "DYE" USED FOR 1ST CT SCAN BUT NOT 2ND, NO PREMEDS USED, PT UNCERTAIN OF CIRCUMSTANCES,,?POSSIBLE MRI CONTRAST ALLERGY, ALL STUDIES DONE "SOMEWHERE" IN [place]//A.C., Onset Date: [id number]    Iodine Hives   Naltrexone     Other reaction(s): Unknown    Scheduled Meds:  (feeding supplement) PROSource Plus  30 mL Oral BID BM    aspirin EC  81 mg Oral Daily   Chlorhexidine Gluconate Cloth  6 each Topical Q0600   darbepoetin (ARANESP) injection - DIALYSIS  40 mcg Intravenous Q Wed-HD   insulin aspart  0-6 Units Subcutaneous Q4H   lanthanum  1,000 mg Oral TID WC   pantoprazole  40 mg Oral Daily   sodium chloride flush  10-40 mL Intracatheter Q12H   Continuous Infusions:  sodium chloride 250 mL (07/17/21 1811)    ceFAZolin (ANCEF) IV     heparin 1,600 Units/hr (07/18/21 0900)   norepinephrine (LEVOPHED) Adult infusion 8 mcg/min (07/17/21 0705)   PRN Meds:.acetaminophen, docusate sodium, lidocaine, oxyCODONE, polyethylene glycol, sodium chloride flush   SUBJECTIVE: Doing well Back pain controlled with pain meds No LE neurological deficit  Planning hd today then catheter removal Afebrile Hds No n/v/diarrhea No other complaint   Review of Systems: ROS All other ROS was negative, except mentioned above     OBJECTIVE: Vitals:   07/18/21 0700 07/18/21 0740 07/18/21 0800 07/18/21 0900  BP: (!) 98/55  103/68 109/62  Pulse: 93  92 95  Resp: 19  (!) 23 (!) 25  Temp:  97.9 F (36.6 C)    TempSrc:  Oral    SpO2: 96%  95% 95%  Weight:      Height:       Body mass index is 39.03 kg/m.  Physical Exam General/constitutional: no distress, pleasant HEENT: Normocephalic, PER, Conj Clear, EOMI, Oropharynx clear Neck supple CV: rrr no mrg Lungs: clear to  auscultation, normal respiratory effort Abd: Soft, Nontender Ext: no edema Skin: scabbed over LLE mild erythema small nodules; nontender Neuro: nonfocal  Central line presence: right chest hd cathtere site no tenderness/erythema/fluctuance   Lab Results Lab Results  Component Value Date   WBC 20.4 (H) 07/18/2021   HGB 8.1 (L) 07/18/2021   HCT 25.6 (L) 07/18/2021   MCV 92.4 07/18/2021   PLT 159 07/18/2021    Lab Results  Component Value Date   CREATININE 7.20 (H) 07/17/2021   BUN 30 (H) 07/17/2021   NA 131 (L) 07/17/2021   K 4.9  07/17/2021   CL 101 07/17/2021   CO2 15 (L) 07/17/2021    Lab Results  Component Value Date   ALT 21 07/16/2021   AST 39 07/16/2021   ALKPHOS 93 07/16/2021   BILITOT 1.2 07/16/2021      Microbiology: Recent Results (from the past 240 hour(s))  Blood Culture (routine x 2)     Status: None (Preliminary result)   Collection Time: 07/16/21  1:15 PM   Specimen: BLOOD  Result Value Ref Range Status   Specimen Description BLOOD BLOOD RIGHT FOREARM  Final   Special Requests   Final    BOTTLES DRAWN AEROBIC AND ANAEROBIC Blood Culture adequate volume   Culture   Final    NO GROWTH 2 DAYS Performed at [facility], [address], [place], [place] [address]    Report Status PENDING  Incomplete  Resp Panel by RT-PCR (Flu A&B, Covid) Nasopharyngeal Swab     Status: None   Collection Time: 07/16/21  1:17 PM   Specimen: Nasopharyngeal Swab; Nasopharyngeal(NP) swabs in vial transport medium  Result Value Ref Range Status   SARS Coronavirus 2 by RT PCR NEGATIVE NEGATIVE Final    Comment: (NOTE) SARS-CoV-2 target nucleic acids are NOT DETECTED.  The SARS-CoV-2 RNA is generally detectable in upper respiratory specimens during the acute phase of infection. The lowest concentration of SARS-CoV-2 viral copies this assay can detect is 138 copies/mL. A negative result does not preclude SARS-Cov-2 infection and should not be used as the sole basis for treatment or other patient management decisions. A negative result may occur with  improper specimen collection/handling, submission of specimen other than nasopharyngeal swab, presence of viral mutation(s) within the areas targeted by this assay, and inadequate number of viral copies(<138 copies/mL). A negative result must be combined with clinical observations, patient history, and epidemiological information. The expected result is Negative.  Fact Sheet for Patients:  EntrepreneurPulse.com.au  Fact Sheet for  Healthcare Providers:  IncredibleEmployment.be  This test is no t yet approved or cleared by the [place] FDA and  has been authorized for detection and/or diagnosis of SARS-CoV-2 by FDA under an Emergency Use Authorization (EUA). This EUA will remain  in effect (meaning this test can be used) for the duration of the COVID-19 declaration under Section 564(b)(1) of the Act, 21 U.S.C.section 360bbb-3(b)(1), unless the authorization is terminated  or revoked sooner.       Influenza A by PCR NEGATIVE NEGATIVE Final   Influenza B by PCR NEGATIVE NEGATIVE Final    Comment: (NOTE) The Xpert Xpress SARS-CoV-2/FLU/RSV plus assay is intended as an aid in the diagnosis of influenza from Nasopharyngeal swab specimens and should not be used as a sole basis for treatment. Nasal washings and aspirates are unacceptable for Xpert Xpress SARS-CoV-2/FLU/RSV testing.  Fact Sheet for Patients: EntrepreneurPulse.com.au  Fact Sheet for Healthcare Providers: IncredibleEmployment.be  This test is not yet  approved or cleared by the [place] and has been authorized for detection and/or diagnosis of SARS-CoV-2 by FDA under an Emergency Use Authorization (EUA). This EUA will remain in effect (meaning this test can be used) for the duration of the COVID-19 declaration under Section 564(b)(1) of the Act, 21 U.S.C. section 360bbb-3(b)(1), unless the authorization is terminated or revoked.  Performed at [facility], [place] [address] [place], [place] [address]   Blood Culture (routine x 2)     Status: Abnormal (Preliminary result)   Collection Time: 07/16/21  2:04 PM   Specimen: BLOOD RIGHT WRIST  Result Value Ref Range Status   Specimen Description BLOOD RIGHT WRIST  Final   Special Requests   Final    BOTTLES DRAWN AEROBIC AND ANAEROBIC Blood Culture results may not be optimal due to an inadequate volume of blood received in  culture bottles   Culture  Setup Time   Final    GRAM POSITIVE COCCI IN CLUSTERS IN BOTH AEROBIC AND ANAEROBIC BOTTLES CRITICAL RESULT CALLED TO, READ BACK BY AND VERIFIED WITH: C. AMEND PHARMD, AT 8921 07/17/21 D. VANHOOK    Culture (A)  Final    STAPHYLOCOCCUS AUREUS SUSCEPTIBILITIES TO FOLLOW Performed at [facility], [place] [address], [place], [place] [address]    Report Status PENDING  Incomplete  Blood Culture ID Panel (Reflexed)     Status: Abnormal   Collection Time: 07/16/21  2:04 PM  Result Value Ref Range Status   Enterococcus faecalis NOT DETECTED NOT DETECTED Final   Enterococcus Faecium NOT DETECTED NOT DETECTED Final   Listeria monocytogenes NOT DETECTED NOT DETECTED Final   Staphylococcus species DETECTED (A) NOT DETECTED Final    Comment: CRITICAL RESULT CALLED TO, READ BACK BY AND VERIFIED WITH: C. AMEND PHARMD, AT 0450 07/17/21 D. VANHOOK    Staphylococcus aureus (BCID) DETECTED (A) NOT DETECTED Final    Comment: CRITICAL RESULT CALLED TO, READ BACK BY AND VERIFIED WITH: C. AMEND PHARMD, AT 4081 07/17/21 D. VANHOOK    Staphylococcus epidermidis NOT DETECTED NOT DETECTED Final   Staphylococcus lugdunensis NOT DETECTED NOT DETECTED Final   Streptococcus species NOT DETECTED NOT DETECTED Final   Streptococcus agalactiae NOT DETECTED NOT DETECTED Final   Streptococcus pneumoniae NOT DETECTED NOT DETECTED Final   Streptococcus pyogenes NOT DETECTED NOT DETECTED Final   A.calcoaceticus-baumannii NOT DETECTED NOT DETECTED Final   Bacteroides fragilis NOT DETECTED NOT DETECTED Final   Enterobacterales NOT DETECTED NOT DETECTED Final   Enterobacter cloacae complex NOT DETECTED NOT DETECTED Final   Escherichia coli NOT DETECTED NOT DETECTED Final   Klebsiella aerogenes NOT DETECTED NOT DETECTED Final   Klebsiella oxytoca NOT DETECTED NOT DETECTED Final   Klebsiella pneumoniae NOT DETECTED NOT DETECTED Final   Proteus species NOT DETECTED NOT DETECTED Final    Salmonella species NOT DETECTED NOT DETECTED Final   Serratia marcescens NOT DETECTED NOT DETECTED Final   Haemophilus influenzae NOT DETECTED NOT DETECTED Final   Neisseria meningitidis NOT DETECTED NOT DETECTED Final   Pseudomonas aeruginosa NOT DETECTED NOT DETECTED Final   Stenotrophomonas maltophilia NOT DETECTED NOT DETECTED Final   Candida albicans NOT DETECTED NOT DETECTED Final   Candida auris NOT DETECTED NOT DETECTED Final   Candida glabrata NOT DETECTED NOT DETECTED Final   Candida krusei NOT DETECTED NOT DETECTED Final   Candida parapsilosis NOT DETECTED NOT DETECTED Final   Candida tropicalis NOT DETECTED NOT DETECTED Final   Cryptococcus neoformans/gattii NOT DETECTED NOT DETECTED  Final   Meth resistant mecA/C and MREJ NOT DETECTED NOT DETECTED Final    Comment: Performed at [facility], [place] [address], [place], [place] [address]  Gastrointestinal Panel by PCR , Stool     Status: None   Collection Time: 07/16/21  3:08 PM   Specimen: STOOL  Result Value Ref Range Status   Campylobacter species NOT DETECTED NOT DETECTED Final   Plesimonas shigelloides NOT DETECTED NOT DETECTED Final   Salmonella species NOT DETECTED NOT DETECTED Final   Yersinia enterocolitica NOT DETECTED NOT DETECTED Final   Vibrio species NOT DETECTED NOT DETECTED Final   Vibrio cholerae NOT DETECTED NOT DETECTED Final   Enteroaggregative E coli (EAEC) NOT DETECTED NOT DETECTED Final   Enteropathogenic E coli (EPEC) NOT DETECTED NOT DETECTED Final   Enterotoxigenic E coli (ETEC) NOT DETECTED NOT DETECTED Final   Shiga like toxin producing E coli (STEC) NOT DETECTED NOT DETECTED Final   Shigella/Enteroinvasive E coli (EIEC) NOT DETECTED NOT DETECTED Final   Cryptosporidium NOT DETECTED NOT DETECTED Final   Cyclospora cayetanensis NOT DETECTED NOT DETECTED Final   Entamoeba histolytica NOT DETECTED NOT DETECTED Final   Giardia lamblia NOT DETECTED NOT DETECTED Final   Adenovirus F40/41 NOT  DETECTED NOT DETECTED Final   Astrovirus NOT DETECTED NOT DETECTED Final   Norovirus GI/GII NOT DETECTED NOT DETECTED Final   Rotavirus A NOT DETECTED NOT DETECTED Final   Sapovirus (I, II, IV, and V) NOT DETECTED NOT DETECTED Final    Comment: Performed at [facility], [place] [place], [place] [address]  C Difficile Quick Screen w PCR reflex     Status: None   Collection Time: 07/16/21  3:08 PM   Specimen: STOOL  Result Value Ref Range Status   C Diff antigen NEGATIVE NEGATIVE Final   C Diff toxin NEGATIVE NEGATIVE Final   C Diff interpretation No C. difficile detected.  Final    Comment: Performed at [facility], [place] [address], [place], [place] [address]  Culture, blood (Routine X 2) w Reflex to ID Panel     Status: None (Preliminary result)   Collection Time: 07/17/21  8:45 AM   Specimen: BLOOD RIGHT ARM  Result Value Ref Range Status   Specimen Description BLOOD RIGHT ARM  Final   Special Requests   Final    BOTTLES DRAWN AEROBIC AND ANAEROBIC Blood Culture adequate volume   Culture   Final    NO GROWTH < 24 HOURS Performed at [facility], [place] [address], [place], [place] [address]    Report Status PENDING  Incomplete  Culture, blood (Routine X 2) w Reflex to ID Panel     Status: None (Preliminary result)   Collection Time: 07/17/21  8:55 AM   Specimen: BLOOD LEFT HAND  Result Value Ref Range Status   Specimen Description BLOOD LEFT HAND  Final   Special Requests   Final    BOTTLES DRAWN AEROBIC AND ANAEROBIC Blood Culture adequate volume   Culture   Final    NO GROWTH < 24 HOURS Performed at [facility], [place] [address], [place], [place] [address]    Report Status PENDING  Incomplete     Serology:   Imaging: If present, new imagings (plain films, ct scans, and mri) have been personally visualized and interpreted; radiology reports have been reviewed. Decision making incorporated into the Impression / Recommendations.  7/19  tte No vegetation  [employer],  MD [facility] for Infectious Disease [place] [phone] pager    07/18/2021, 11:06 AM

## 2021-07-18 NOTE — Progress Notes (Signed)
Report attempted x2

## 2021-07-18 NOTE — Progress Notes (Signed)
IR received request for tunneled HD catheter removal. Pt currently in dialysis, not scheduled to be done until at least 5pm. Also, pt requesting sedation for removal. This is not typical as local anesthetic is usually sufficient. However, if pt is adamant about sedation, will have to be NPO p MN As such, will plan catheter removal as early as possible tomorrow am. Will NPO p MN in case pt is adamant about sedation, but hopefully will be agreeable to local only.  Ascencion Dike PA-C Interventional Radiology 07/18/2021 4:12 PM

## 2021-07-19 ENCOUNTER — Inpatient Hospital Stay (HOSPITAL_COMMUNITY): Payer: Medicare Other

## 2021-07-19 DIAGNOSIS — B9561 Methicillin susceptible Staphylococcus aureus infection as the cause of diseases classified elsewhere: Secondary | ICD-10-CM | POA: Diagnosis not present

## 2021-07-19 DIAGNOSIS — A419 Sepsis, unspecified organism: Secondary | ICD-10-CM | POA: Diagnosis not present

## 2021-07-19 DIAGNOSIS — R7881 Bacteremia: Secondary | ICD-10-CM | POA: Diagnosis not present

## 2021-07-19 DIAGNOSIS — N186 End stage renal disease: Secondary | ICD-10-CM | POA: Diagnosis not present

## 2021-07-19 HISTORY — PX: IR REMOVAL TUN CV CATH W/O FL: IMG2289

## 2021-07-19 LAB — CBC
HCT: 30 % — ABNORMAL LOW (ref 36.0–46.0)
Hemoglobin: 9.8 g/dL — ABNORMAL LOW (ref 12.0–15.0)
MCH: 29.3 pg (ref 26.0–34.0)
MCHC: 32.7 g/dL (ref 30.0–36.0)
MCV: 89.8 fL (ref 80.0–100.0)
Platelets: 129 10*3/uL — ABNORMAL LOW (ref 150–400)
RBC: 3.34 MIL/uL — ABNORMAL LOW (ref 3.87–5.11)
RDW: 16.4 % — ABNORMAL HIGH (ref 11.5–15.5)
WBC: 14.3 10*3/uL — ABNORMAL HIGH (ref 4.0–10.5)
nRBC: 0 % (ref 0.0–0.2)

## 2021-07-19 LAB — GLUCOSE, CAPILLARY
Glucose-Capillary: 96 mg/dL (ref 70–99)
Glucose-Capillary: 95 mg/dL (ref 70–99)
Glucose-Capillary: 120 mg/dL — ABNORMAL HIGH (ref 70–99)
Glucose-Capillary: 150 mg/dL — ABNORMAL HIGH (ref 70–99)

## 2021-07-19 LAB — BASIC METABOLIC PANEL
Anion gap: 12 (ref 5–15)
BUN: 23 mg/dL (ref 8–23)
CO2: 21 mmol/L — ABNORMAL LOW (ref 22–32)
Calcium: 6.5 mg/dL — ABNORMAL LOW (ref 8.9–10.3)
Chloride: 98 mmol/L (ref 98–111)
Creatinine, Ser: 5.8 mg/dL — ABNORMAL HIGH (ref 0.44–1.00)
GFR, Estimated: 8 mL/min — ABNORMAL LOW (ref >60)
Glucose, Bld: 138 mg/dL — ABNORMAL HIGH (ref 70–99)
Potassium: 3.5 mmol/L (ref 3.5–5.1)
Sodium: 131 mmol/L — ABNORMAL LOW (ref 135–145)

## 2021-07-19 LAB — CULTURE, BLOOD (ROUTINE X 2)

## 2021-07-19 LAB — HEPARIN LEVEL (UNFRACTIONATED): Heparin Unfractionated: 0.49 IU/mL (ref 0.30–0.70)

## 2021-07-19 MED ORDER — LIDOCAINE HCL 1 % IJ SOLN
INTRAMUSCULAR | Status: AC
  Filled 2021-07-19: qty 20

## 2021-07-19 MED ORDER — DOXERCALCIFEROL 4 MCG/2ML IV SOLN: 4.0000 ug | INTRAVENOUS | Status: DC

## 2021-07-19 MED ORDER — MIDAZOLAM HCL 2 MG/2ML IJ SOLN
INTRAMUSCULAR | Status: AC | PRN
Start: 2021-07-19 — End: 2021-07-19
  Administered 2021-07-19: 0.5 mg via INTRAVENOUS
  Administered 2021-07-19: 1 mg via INTRAVENOUS

## 2021-07-19 MED ORDER — FENTANYL CITRATE (PF) 100 MCG/2ML IJ SOLN
INTRAMUSCULAR | Status: AC | PRN
  Administered 2021-07-19: 50 ug via INTRAVENOUS

## 2021-07-19 MED ORDER — FENTANYL CITRATE (PF) 100 MCG/2ML IJ SOLN
INTRAMUSCULAR | Status: AC
  Filled 2021-07-19: qty 2

## 2021-07-19 MED ORDER — MIDAZOLAM HCL 2 MG/2ML IJ SOLN
INTRAMUSCULAR | Status: AC
  Filled 2021-07-19: qty 4

## 2021-07-19 NOTE — Progress Notes (Signed)
[facility] for IV Heparin Indication: Hx of DVT  Patient Measurements: Height: 5\' 1"  (154.9 cm) Weight: 92 kg (202 lb 13.2 oz) IBW/kg (Calculated) : 47.8 Heparin Dosing Weight: 69 kg   Vital Signs: Temp: 98.2 F (36.8 C) (07/21 0924) Temp Source: Oral (07/21 0924) BP: 142/43 (07/21 0924) Pulse Rate: 88 (07/21 0924)  Labs: Recent Labs    07/16/21 1402 07/16/21 1402 07/17/21 0408 07/17/21 2683 07/17/21 1414 07/18/21 0341 07/18/21 1430 07/18/21 1431 07/18/21 2345  HGB 11.9*  --  10.7*  --   --  8.1*  --   --  9.8*  HCT 37.8  --  34.8*  --   --  25.6*  --   --  30.0*  PLT 143*  --  146*  --   --  159  --   --  129*  APTT 24  --  41*  --  45*  --   --   --   --   LABPROT 16.5*  --   --   --   --   --   --   --   --   INR 1.3*  --   --   --   --   --   --   --   --   HEPARINUNFRC  --    < > 0.16*  --  0.16* 0.19*  --  0.40 0.49  CREATININE 7.31*  --  7.20*  --   --   --  8.91*  --  5.80*  TROPONINIHS  --   --  83* 87*  --   --   --   --   --    < > = values in this interval not displayed.     Estimated Creatinine Clearance: 9.9 mL/min (A) (by C-G formula based on SCr of 5.8 mg/dL (H)).  Medications:  Apixaban 2.5 mg BID PTA  Assessment: 65 yr old woman with ESRD (on MWF HD) presented to ED critically ill due to MSSA bacteremia and septic shock. Patient has hx of VTE (05/01/21) and was on apixaban 2.5 mg PO BID PTA (last dose unknown, but within past week per patient). Heparin level and PTT correlating.  Heparin was increased to 1600 units/hr on 7/20 and heparin level was within range twice at 0.40 units/mL and 0.49 units/ml. H/H 9.8/30.0, plt 129 (Hgb lower 7/20; nephrology resumed ESA on 7/20). No signs or symptoms of bleeding.  Goal of Therapy:  Heparin level 0.3 -0.7 units/ml Monitor platelets by anticoagulation protocol: Yes   Plan:  Continue heparin infusion at 1600 units/hr Monitor daily heparin level, CBC Monitor for signs  and symptoms of bleeding F/U transition to oral anticoagulant when able   Varney Daily, PharmD PGY1 Pharmacy Resident  Please check AMION for all [facility] pharmacy phone numbers After 10:00 PM call main pharmacy [phone]

## 2021-07-19 NOTE — Progress Notes (Signed)
[facility]  Date of Admission:  07/16/2021     CC: Mssa bacteremia  Lines: Chronic right chest tunneled hd catheter Nonfunctioning LUE native AVF (no recent access for the past year)  Abx: 7/19-c cefazolin  ASSESSMENT: #mssa bacteremia  #esrd #lumbar back pain Duration of sx 2 weeks/community onset likely hd catheter beign source. Nonfunctioning native avf clinically doesn't appear infected 7/18 bcx positive 7/19 and 7/20 bcx ngtd 7/19 tte no veg 7/20 mri of lumbar spine showed no evidence pyogenic focus -- however given bacteremia and no other obvious cause back pain that is new, will plan on 6 weeks tx duration LLE rash onset late 04/2019 doesn't appear to be staph aureus bsi related    -await removal right chest tunneled cath; please allow 2 days line holiday and then place new HD line on the left chest -repeat blood cx post right chest catheter removal -continue cefazolin -plan 6weeks cefazolin from last day of negative bcx or catheter removal whichever is later -discussed with primary team    I spent more than 35 minute reviewing data/chart, and coordinating care and >50% direct face to face time providing counseling/discussing diagnostics/treatment plan with patient      Active Problems:   ESRD (end stage renal disease) ([place])   Severe sepsis without septic shock ([place])   MSSA bacteremia   Acute low back pain   Allergies  Allergen Reactions   Carnosine     Other reaction(s): Unknown   Gadolinium Derivatives Hives and Other (See Comments)    HIVES, Desc: HIVES W/ "DYE" USED FOR 1ST CT SCAN BUT NOT 2ND, NO PREMEDS USED, PT UNCERTAIN OF CIRCUMSTANCES,,?POSSIBLE MRI CONTRAST ALLERGY, ALL STUDIES DONE "SOMEWHERE" IN [place]//A.C., Onset Date: [id number]   Iohexol Other (See Comments)     Code: HIVES, Desc: HIVES W/ "DYE" USED FOR 1ST CT SCAN BUT NOT 2ND, NO PREMEDS USED, PT UNCERTAIN OF CIRCUMSTANCES,,?POSSIBLE MRI  CONTRAST ALLERGY, ALL STUDIES DONE "SOMEWHERE" IN [place]//A.C., Onset Date: [id number]    Iodine Hives   Naltrexone     Other reaction(s): Unknown    Scheduled Meds:  (feeding supplement) PROSource Plus  30 mL Oral BID BM   aspirin EC  81 mg Oral Daily   atorvastatin  10 mg Oral q1800   Chlorhexidine Gluconate Cloth  6 each Topical Q0600   darbepoetin (ARANESP) injection - DIALYSIS  40 mcg Intravenous Q Wed-HD   [START ON 07/23/2021] doxercalciferol  4 mcg Intravenous Q M,W,F-HD   insulin aspart  0-6 Units Subcutaneous Q4H   lanthanum  1,000 mg Oral TID WC   pantoprazole  40 mg Oral Daily   sodium chloride flush  10-40 mL Intracatheter Q12H   Continuous Infusions:  sodium chloride 250 mL (07/19/21 0336)    ceFAZolin (ANCEF) IV Stopped (07/18/21 1922)   heparin Stopped (07/19/21 1035)   PRN Meds:.acetaminophen, albuterol, docusate sodium, lidocaine, polyethylene glycol, sodium chloride flush   SUBJECTIVE: Back pain improving Doing pt/ot Hd catheter not yet removed Mri lower back no obvious pyogenic focus No n/v/diarrhea/f/c  Repeat bcx from 7/19 & 7/20 negative  Review of Systems: ROS All other ROS was negative, except mentioned above     OBJECTIVE: Vitals:   07/18/21 2100 07/18/21 2226 07/19/21 0343 07/19/21 0924  BP: 108/66 107/65 (!) 93/41 (!) 142/43  Pulse: (!) 106 (!) 104 94 88  Resp: (!) 23 18 14 18   Temp:  99.2 F (37.3 C) 98.4 F (36.9 C)  98.2 F (36.8 C)  TempSrc:  Oral Oral Oral  SpO2: 90% 96% 97% 93%  Weight:      Height:       Body mass index is 38.32 kg/m.  Physical Exam General/constitutional: no distress, pleasant HEENT: Normocephalic, PER, Conj Clear, EOMI, Oropharynx clear Neck supple CV: rrr no mrg Lungs: clear to auscultation, normal respiratory effort Abd: Soft, Nontender Ext: no edema Skin: No Rash Neuro: nonfocal MSK: mild lumbar spine tenderness  Central line presence: right chest hd cathtere site no  tenderness/erythema/fluctuance   Lab Results Lab Results  Component Value Date   WBC 14.3 (H) 07/18/2021   HGB 9.8 (L) 07/18/2021   HCT 30.0 (L) 07/18/2021   MCV 89.8 07/18/2021   PLT 129 (L) 07/18/2021    Lab Results  Component Value Date   CREATININE 5.80 (H) 07/18/2021   BUN 23 07/18/2021   NA 131 (L) 07/18/2021   K 3.5 07/18/2021   CL 98 07/18/2021   CO2 21 (L) 07/18/2021    Lab Results  Component Value Date   ALT 21 07/16/2021   AST 39 07/16/2021   ALKPHOS 93 07/16/2021   BILITOT 1.2 07/16/2021      Microbiology: Recent Results (from the past 240 hour(s))  Blood Culture (routine x 2)     Status: None (Preliminary result)   Collection Time: 07/16/21  1:15 PM   Specimen: BLOOD  Result Value Ref Range Status   Specimen Description BLOOD BLOOD RIGHT FOREARM  Final   Special Requests   Final    BOTTLES DRAWN AEROBIC AND ANAEROBIC Blood Culture adequate volume   Culture   Final    NO GROWTH 2 DAYS Performed at [facility], [address], [place], [place] [address]    Report Status PENDING  Incomplete  Resp Panel by RT-PCR (Flu A&B, Covid) Nasopharyngeal Swab     Status: None   Collection Time: 07/16/21  1:17 PM   Specimen: Nasopharyngeal Swab; Nasopharyngeal(NP) swabs in vial transport medium  Result Value Ref Range Status   SARS Coronavirus 2 by RT PCR NEGATIVE NEGATIVE Final    Comment: (NOTE) SARS-CoV-2 target nucleic acids are NOT DETECTED.  The SARS-CoV-2 RNA is generally detectable in upper respiratory specimens during the acute phase of infection. The lowest concentration of SARS-CoV-2 viral copies this assay can detect is 138 copies/mL. A negative result does not preclude SARS-Cov-2 infection and should not be used as the sole basis for treatment or other patient management decisions. A negative result may occur with  improper specimen collection/handling, submission of specimen other than nasopharyngeal swab, presence of viral mutation(s)  within the areas targeted by this assay, and inadequate number of viral copies(<138 copies/mL). A negative result must be combined with clinical observations, patient history, and epidemiological information. The expected result is Negative.  Fact Sheet for Patients:  EntrepreneurPulse.com.au  Fact Sheet for Healthcare Providers:  IncredibleEmployment.be  This test is no t yet approved or cleared by the [place] FDA and  has been authorized for detection and/or diagnosis of SARS-CoV-2 by FDA under an Emergency Use Authorization (EUA). This EUA will remain  in effect (meaning this test can be used) for the duration of the COVID-19 declaration under Section 564(b)(1) of the Act, 21 U.S.C.section 360bbb-3(b)(1), unless the authorization is terminated  or revoked sooner.       Influenza A by PCR NEGATIVE NEGATIVE Final   Influenza B by PCR NEGATIVE NEGATIVE Final    Comment: (NOTE) The Xpert  Xpress SARS-CoV-2/FLU/RSV plus assay is intended as an aid in the diagnosis of influenza from Nasopharyngeal swab specimens and should not be used as a sole basis for treatment. Nasal washings and aspirates are unacceptable for Xpert Xpress SARS-CoV-2/FLU/RSV testing.  Fact Sheet for Patients: EntrepreneurPulse.com.au  Fact Sheet for Healthcare Providers: IncredibleEmployment.be  This test is not yet approved or cleared by the [place] FDA and has been authorized for detection and/or diagnosis of SARS-CoV-2 by FDA under an Emergency Use Authorization (EUA). This EUA will remain in effect (meaning this test can be used) for the duration of the COVID-19 declaration under Section 564(b)(1) of the Act, 21 U.S.C. section 360bbb-3(b)(1), unless the authorization is terminated or revoked.  Performed at [facility], [place] [address], [place], [place] [address]   Blood Culture (routine x 2)     Status: Abnormal    Collection Time: 07/16/21  2:04 PM   Specimen: BLOOD RIGHT WRIST  Result Value Ref Range Status   Specimen Description BLOOD RIGHT WRIST  Final   Special Requests   Final    BOTTLES DRAWN AEROBIC AND ANAEROBIC Blood Culture results may not be optimal due to an inadequate volume of blood received in culture bottles   Culture  Setup Time   Final    GRAM POSITIVE COCCI IN CLUSTERS IN BOTH AEROBIC AND ANAEROBIC BOTTLES CRITICAL RESULT CALLED TO, READ BACK BY AND VERIFIED WITHEather Colas PHARMD, AT 1829 07/17/21 [place] Performed at [facility], [place] [address], [place], [place] [address]    Culture STAPHYLOCOCCUS AUREUS (A)  Final   Report Status 07/19/2021 FINAL  Final   Organism ID, Bacteria STAPHYLOCOCCUS AUREUS  Final      Susceptibility   Staphylococcus aureus - MIC*    CIPROFLOXACIN <=0.5 SENSITIVE Sensitive     ERYTHROMYCIN <=0.25 SENSITIVE Sensitive     GENTAMICIN <=0.5 SENSITIVE Sensitive     OXACILLIN <=0.25 SENSITIVE Sensitive     TETRACYCLINE <=1 SENSITIVE Sensitive     VANCOMYCIN <=0.5 SENSITIVE Sensitive     TRIMETH/SULFA 160 RESISTANT Resistant     CLINDAMYCIN <=0.25 SENSITIVE Sensitive     RIFAMPIN <=0.5 SENSITIVE Sensitive     Inducible Clindamycin NEGATIVE Sensitive     * STAPHYLOCOCCUS AUREUS  Blood Culture ID Panel (Reflexed)     Status: Abnormal   Collection Time: 07/16/21  2:04 PM  Result Value Ref Range Status   Enterococcus faecalis NOT DETECTED NOT DETECTED Final   Enterococcus Faecium NOT DETECTED NOT DETECTED Final   Listeria monocytogenes NOT DETECTED NOT DETECTED Final   Staphylococcus species DETECTED (A) NOT DETECTED Final    Comment: CRITICAL RESULT CALLED TO, READ BACK BY AND VERIFIED WITH: C. AMEND PHARMD, AT 0450 07/17/21 D. VANHOOK    Staphylococcus aureus (BCID) DETECTED (A) NOT DETECTED Final    Comment: CRITICAL RESULT CALLED TO, READ BACK BY AND VERIFIED WITH: C. AMEND PHARMD, AT 9678 07/17/21 D. VANHOOK    Staphylococcus  epidermidis NOT DETECTED NOT DETECTED Final   Staphylococcus lugdunensis NOT DETECTED NOT DETECTED Final   Streptococcus species NOT DETECTED NOT DETECTED Final   Streptococcus agalactiae NOT DETECTED NOT DETECTED Final   Streptococcus pneumoniae NOT DETECTED NOT DETECTED Final   Streptococcus pyogenes NOT DETECTED NOT DETECTED Final   A.calcoaceticus-baumannii NOT DETECTED NOT DETECTED Final   Bacteroides fragilis NOT DETECTED NOT DETECTED Final   Enterobacterales NOT DETECTED NOT DETECTED Final   Enterobacter cloacae complex NOT DETECTED NOT DETECTED Final   Escherichia  coli NOT DETECTED NOT DETECTED Final   Klebsiella aerogenes NOT DETECTED NOT DETECTED Final   Klebsiella oxytoca NOT DETECTED NOT DETECTED Final   Klebsiella pneumoniae NOT DETECTED NOT DETECTED Final   Proteus species NOT DETECTED NOT DETECTED Final   Salmonella species NOT DETECTED NOT DETECTED Final   Serratia marcescens NOT DETECTED NOT DETECTED Final   Haemophilus influenzae NOT DETECTED NOT DETECTED Final   Neisseria meningitidis NOT DETECTED NOT DETECTED Final   Pseudomonas aeruginosa NOT DETECTED NOT DETECTED Final   Stenotrophomonas maltophilia NOT DETECTED NOT DETECTED Final   Candida albicans NOT DETECTED NOT DETECTED Final   Candida auris NOT DETECTED NOT DETECTED Final   Candida glabrata NOT DETECTED NOT DETECTED Final   Candida krusei NOT DETECTED NOT DETECTED Final   Candida parapsilosis NOT DETECTED NOT DETECTED Final   Candida tropicalis NOT DETECTED NOT DETECTED Final   Cryptococcus neoformans/gattii NOT DETECTED NOT DETECTED Final   Meth resistant mecA/C and MREJ NOT DETECTED NOT DETECTED Final    Comment: Performed at [facility], [place] [address] [place], [place] [address]  Gastrointestinal Panel by PCR , Stool     Status: None   Collection Time: 07/16/21  3:08 PM   Specimen: STOOL  Result Value Ref Range Status   Campylobacter species NOT DETECTED NOT DETECTED Final   Plesimonas  shigelloides NOT DETECTED NOT DETECTED Final   Salmonella species NOT DETECTED NOT DETECTED Final   Yersinia enterocolitica NOT DETECTED NOT DETECTED Final   Vibrio species NOT DETECTED NOT DETECTED Final   Vibrio cholerae NOT DETECTED NOT DETECTED Final   Enteroaggregative E coli (EAEC) NOT DETECTED NOT DETECTED Final   Enteropathogenic E coli (EPEC) NOT DETECTED NOT DETECTED Final   Enterotoxigenic E coli (ETEC) NOT DETECTED NOT DETECTED Final   Shiga like toxin producing E coli (STEC) NOT DETECTED NOT DETECTED Final   Shigella/Enteroinvasive E coli (EIEC) NOT DETECTED NOT DETECTED Final   Cryptosporidium NOT DETECTED NOT DETECTED Final   Cyclospora cayetanensis NOT DETECTED NOT DETECTED Final   Entamoeba histolytica NOT DETECTED NOT DETECTED Final   Giardia lamblia NOT DETECTED NOT DETECTED Final   Adenovirus F40/41 NOT DETECTED NOT DETECTED Final   Astrovirus NOT DETECTED NOT DETECTED Final   Norovirus GI/GII NOT DETECTED NOT DETECTED Final   Rotavirus A NOT DETECTED NOT DETECTED Final   Sapovirus (I, II, IV, and V) NOT DETECTED NOT DETECTED Final    Comment: Performed at [facility], [place] [place], [place] [address]  C Difficile Quick Screen w PCR reflex     Status: None   Collection Time: 07/16/21  3:08 PM   Specimen: STOOL  Result Value Ref Range Status   C Diff antigen NEGATIVE NEGATIVE Final   C Diff toxin NEGATIVE NEGATIVE Final   C Diff interpretation No C. difficile detected.  Final    Comment: Performed at [facility], [place] [address] [place], [place] [address]  Culture, blood (Routine X 2) w Reflex to ID Panel     Status: None (Preliminary result)   Collection Time: 07/17/21  8:45 AM   Specimen: BLOOD RIGHT ARM  Result Value Ref Range Status   Specimen Description BLOOD RIGHT ARM  Final   Special Requests   Final    BOTTLES DRAWN AEROBIC AND ANAEROBIC Blood Culture adequate volume   Culture   Final    NO GROWTH < 24 HOURS Performed at  [facility], [place],  [place] [address]    Report Status PENDING  Incomplete  Culture, blood (Routine X 2) w Reflex to ID Panel     Status: None (Preliminary result)   Collection Time: 07/17/21  8:55 AM   Specimen: BLOOD LEFT HAND  Result Value Ref Range Status   Specimen Description BLOOD LEFT HAND  Final   Special Requests   Final    BOTTLES DRAWN AEROBIC AND ANAEROBIC Blood Culture adequate volume   Culture   Final    NO GROWTH < 24 HOURS Performed at [facility], [place] [address], [place], [place] [address]    Report Status PENDING  Incomplete     Serology:   Imaging: If present, new imagings (plain films, ct scans, and mri) have been personally visualized and interpreted; radiology reports have been reviewed. Decision making incorporated into the Impression / Recommendations.  7/19 tte No vegetation  7/20 mri lspine No specific findings to suggest discitis/osteomyelitis at this time. No evidence of epidural abscess on this non-contrast study.   Schmorl nodes within the T12 inferior endplate, L1 inferior endplate, L3 superior endplate and L3 inferior endplate. There is mild marrow edema surrounding the Schmorl node within the T12 inferior endplate, and this Schmorl node may be acute/subacute. Minimal edema elsewhere, as described and degenerative in appearance.   Lumbar spondylosis, as outlined and with findings most notably as follows.   At L4-L5, there is a broad-based central disc protrusion at site of posterior annular fissure. This contributes to multifactorial bilateral subarticular stenosis (mild/moderate right, mild left), contacting and crowding the descending right L5 nerve root. Mild central canal narrowing. Bilateral neural foraminal narrowing (mild/moderate right, mild left). Trace right facet joint effusion.   At L5-S1, a broad-based central disc protrusion at site of posterior annular fissure contributes to multifactorial  bilateral subarticular stenosis (moderate right, mild left). The disc protrusion contacts and crowds the descending right S1 nerve root. The disc protrusion may also contact the descending left S1 nerve root. Mild relative narrowing of the central canal.   No more than mild spinal canal or neural foraminal narrowing at the remaining levels.   Trace right facet joint effusion also present at L3-L4.  [name], [facility] [place] [phone] pager    07/19/2021, 12:34 PM

## 2021-07-19 NOTE — Progress Notes (Addendum)
[facility] Progress Note   Subjective:  Seen in room. No CP or dyspnea. NPO this morning for TDC removal under anesthesia. Using warm pack on L wrist. Did fine with HD yesterday - net 1.8L removed and did fine.  Objective Vitals:   07/18/21 2100 07/18/21 2226 07/19/21 0343 07/19/21 0924  BP: 108/66 107/65 (!) 93/41 (!) 142/43  Pulse: (!) 106 (!) 104 94 88  Resp: (!) 23 18 14 18   Temp:  99.2 F (37.3 C) 98.4 F (36.9 C) 98.2 F (36.8 C)  TempSrc:  Oral Oral Oral  SpO2: 90% 96% 97% 93%  Weight:      Height:       Physical Exam General: Well appearing woman, NAD. Nasal O2 in place. Heart: RRR; no murmur Lungs: CTAB Abdomen: soft, non-tender Extremities: No LE edema Dialysis Access: TDC in L chest  Additional Objective Labs: Basic Metabolic Panel: Recent Labs  Lab 07/17/21 0408 07/18/21 1430 07/18/21 2345  NA 131* 133* 131*  K 4.9 4.5 3.5  CL 101 104 98  CO2 15* 14* 21*  GLUCOSE 131* 113* 138*  BUN 30* 50* 23  CREATININE 7.20* 8.91* 5.80*  CALCIUM 7.3* 7.0* 6.5*  PHOS 6.5* 5.9*  --    Liver Function Tests: Recent Labs  Lab 07/16/21 1402 07/18/21 1430  AST 39  --   ALT 21  --   ALKPHOS 93  --   BILITOT 1.2  --   PROT 7.8  --   ALBUMIN 2.7* 2.0*   CBC: Recent Labs  Lab 07/16/21 1402 07/17/21 0408 07/18/21 0341 07/18/21 2345  WBC 26.9* 29.1* 20.4* 14.3*  NEUTROABS 23.8*  --   --   --   HGB 11.9* 10.7* 8.1* 9.8*  HCT 37.8 34.8* 25.6* 30.0*  MCV 92.2 94.3 92.4 89.8  PLT 143* 146* 159 129*   Blood Culture    Component Value Date/Time   SDES BLOOD LEFT HAND 07/17/2021 0855   SPECREQUEST  07/17/2021 0855    BOTTLES DRAWN AEROBIC AND ANAEROBIC Blood Culture adequate volume   CULT  07/17/2021 0855    NO GROWTH < 24 HOURS Performed at [facility] [address], [place], [place] [address]    REPTSTATUS PENDING 07/17/2021 0160   Studies/Results: MR LUMBAR SPINE WO CONTRAST  Result Date: 07/18/2021 CLINICAL DATA:   Epidural abscess suspected. Additional provided: Bilateral flank pain, worsening for several weeks, dialysis patient, patient unable to walk due to pain. EXAM: MRI LUMBAR SPINE WITHOUT CONTRAST TECHNIQUE: Multiplanar, multisequence MR imaging of the lumbar spine was performed. No intravenous contrast was administered. COMPARISON:  Radiographs of the lumbar spine 05/08/2020. FINDINGS: Segmentation: 5 lumbar vertebrae. The caudal most well-formed intervertebral disc space is designated L5-S1. Alignment:  Trace T11-T12, L1-L2 and L3-L4 grade 1 retrolisthesis. Vertebrae: There are Schmorl nodes within the T12 inferior endplate, L1 inferior endplate, L3 superior endplate and L3 inferior endplate. There is minimal marrow edema surrounding the Schmorl node within the T12 inferior endplate, and this Schmorl node may be acute or subacute. Trace multilevel endplate edema elsewhere, which appear degenerative. Mild edema within the right L5 articular pillar also appears degenerative (series 3, image 5). No specific findings to suggest discitis/osteomyelitis. Conus medullaris and cauda equina: Conus extends to the L1-L2 level. No signal abnormality within the visualized distal spinal cord. No epidural abscess is identified on this noncontrast examination. Paraspinal and other soft tissues: Atrophic kidneys. Atrophy of the lumbar paraspinal musculature. Additionally, there is nonspecific edema signal within  the subcutaneous fat overlying the thoracolumbar spine bilaterally. Disc levels: Mild disc degeneration throughout the lumbar and visualized lower thoracic spine. T11-T12: Imaged sagittally. Trace grade 1 retrolisthesis. Small disc bulge. Mild partial effacement of the ventral thecal sac without spinal cord mass effect. No appreciable significant foraminal stenosis. T12-L1: Imaged sagittally. Minimal disc bulge. No significant spinal canal or foraminal stenosis. L1-L2: Trace grade 1 retrolisthesis. No significant disc  herniation or stenosis. L2-L3: Shallow left subarticular/foraminal disc protrusion. Minimal left subarticular narrowing without appreciable nerve root impingement. Central canal patent. No significant foraminal stenosis. L3-L4: Trace grade 1 retrolisthesis. Disc bulge. Superimposed broad-based central disc protrusion eccentric to the right. Mild facet arthrosis/ligamentum flavum hypertrophy. Trace right facet joint effusion. Mild bilateral subarticular and central canal narrowing without frank nerve root impingement. Mild relative bilateral neural foraminal narrowing. L4-L5: Disc bulge. Superimposed broad-based central disc protrusion at site of posterior annular fissure, eccentric to the right. Mild facet arthrosis/ligamentum flavum hypertrophy. Trace right facet joint effusion. The disc protrusion contributes to bilateral subarticular narrowing (mild/moderate right, mild left), contacting and crowding the descending right L5 nerve root (series 5, image 23). Mild relative narrowing of the central canal. Bilateral neural foraminal narrowing (mild/moderate right, mild left). L5-S1: Disc bulge with endplate spurring. Superimposed broad-based central disc protrusion at site of posterior annular fissure. Mild facet arthrosis. The disc protrusion contributes to bilateral subarticular stenosis (moderate right, mild left). The disc protrusion contacts and crowds the descending right S1 nerve root. The disc protrusion may also contact the descending left S1 nerve root. Mild relative narrowing of the central canal. No significant foraminal stenosis. IMPRESSION: No specific findings to suggest discitis/osteomyelitis at this time. No evidence of epidural abscess on this non-contrast study. Schmorl nodes within the T12 inferior endplate, L1 inferior endplate, L3 superior endplate and L3 inferior endplate. There is mild marrow edema surrounding the Schmorl node within the T12 inferior endplate, and this Schmorl node may be  acute/subacute. Minimal edema elsewhere, as described and degenerative in appearance. Lumbar spondylosis, as outlined and with findings most notably as follows. At L4-L5, there is a broad-based central disc protrusion at site of posterior annular fissure. This contributes to multifactorial bilateral subarticular stenosis (mild/moderate right, mild left), contacting and crowding the descending right L5 nerve root. Mild central canal narrowing. Bilateral neural foraminal narrowing (mild/moderate right, mild left). Trace right facet joint effusion. At L5-S1, a broad-based central disc protrusion at site of posterior annular fissure contributes to multifactorial bilateral subarticular stenosis (moderate right, mild left). The disc protrusion contacts and crowds the descending right S1 nerve root. The disc protrusion may also contact the descending left S1 nerve root. Mild relative narrowing of the central canal. No more than mild spinal canal or neural foraminal narrowing at the remaining levels. Trace right facet joint effusion also present at L3-L4. Electronically Signed   By: Kellie Simmering DO   On: 07/18/2021 14:05   ECHOCARDIOGRAM COMPLETE  Result Date: 07/17/2021    ECHOCARDIOGRAM REPORT   Patient Name:   [facility] Date of Exam: 07/17/2021 Medical Rec #:  [id number]            Height:       61.0 in Accession #:    [id number]           Weight:       207.2 lb Date of Birth:  1956/11/05             BSA:          1.917  m Patient Age:    21 years             BP:           137/58 mmHg Patient Gender: F                    HR:           89 bpm. Exam Location:  Inpatient Procedure: 2D Echo, Cardiac Doppler and Color Doppler Indications:    Endocarditis  History:        Patient has prior history of Echocardiogram examinations, most                 recent 04/08/2021. CHF, CAD, COPD; Risk Factors:Hypertension and                 Diabetes.  Sonographer:    Bernadene Person RDCS Referring Phys: [id number] Kipp Brood   Sonographer Comments: Image acquisition challenging due to uncooperative patient. IMPRESSIONS  1. Left ventricular ejection fraction, by estimation, is 70 to 75%. The left ventricle has hyperdynamic function. The left ventricle has no regional wall motion abnormalities. Left ventricular diastolic parameters are consistent with Grade I diastolic dysfunction (impaired relaxation). Elevated left atrial pressure.  2. Right ventricular systolic function is normal. The right ventricular size is normal. The estimated right ventricular systolic pressure is 97.0 mmHg.  3. Left atrial size was mildly dilated.  4. The mitral valve is normal in structure. No evidence of mitral valve regurgitation. Moderate mitral annular calcification.  5. The aortic valve is tricuspid. There is mild calcification of the aortic valve. There is mild thickening of the aortic valve. Aortic valve regurgitation is not visualized. Mild to moderate aortic valve sclerosis/calcification is present, without any evidence of aortic stenosis. Aortic valve mean gradient measures 14.0 mmHg. Aortic valve Vmax measures 2.52 m/s.  6. The inferior vena cava is normal in size with greater than 50% respiratory variability, suggesting right atrial pressure of 3 mmHg. Comparison(s): Prior images unable to be directly viewed, comparison made by report only. Conclusion(s)/Recommendation(s): No evidence of valvular vegetations on this transthoracic echocardiogram. Would recommend a transesophageal echocardiogram to exclude infective endocarditis if clinically indicated. FINDINGS  Left Ventricle: Left ventricular ejection fraction, by estimation, is 70 to 75%. The left ventricle has hyperdynamic function. The left ventricle has no regional wall motion abnormalities. The left ventricular internal cavity size was normal in size. There is no left ventricular hypertrophy. Left ventricular diastolic parameters are consistent with Grade I diastolic dysfunction (impaired  relaxation). Elevated left atrial pressure. Right Ventricle: The right ventricular size is normal. No increase in right ventricular wall thickness. Right ventricular systolic function is normal. The tricuspid regurgitant velocity is 3.15 m/s, and with an assumed right atrial pressure of 3 mmHg, the estimated right ventricular systolic pressure is 26.3 mmHg. Left Atrium: Left atrial size was mildly dilated. Right Atrium: Right atrial size was normal in size. Pericardium: There is no evidence of pericardial effusion. Mitral Valve: The mitral valve is normal in structure. Moderate mitral annular calcification. No evidence of mitral valve regurgitation. Tricuspid Valve: The tricuspid valve is normal in structure. Tricuspid valve regurgitation is trivial. Aortic Valve: Borderline criteria for aortic valve stenosis, gradients are exaggerated by hyperdynamic LV function. The aortic valve is tricuspid. There is mild calcification of the aortic valve. There is mild thickening of the aortic valve. Aortic valve  regurgitation is not visualized. Mild to moderate aortic valve sclerosis/calcification is present, without any evidence of aortic  stenosis. Aortic valve mean gradient measures 14.0 mmHg. Aortic valve peak gradient measures 25.5 mmHg. Aortic valve area, by VTI measures 1.77 cm. Pulmonic Valve: The pulmonic valve was grossly normal. Pulmonic valve regurgitation is mild. Aorta: The aortic root is normal in size and structure. Venous: The inferior vena cava is normal in size with greater than 50% respiratory variability, suggesting right atrial pressure of 3 mmHg. IAS/Shunts: No atrial level shunt detected by color flow Doppler.  LEFT VENTRICLE PLAX 2D LVIDd:         4.10 cm  Diastology LVIDs:         2.50 cm  LV e' medial:    7.13 cm/s LV PW:         1.10 cm  LV E/e' medial:  16.8 LV IVS:        1.10 cm  LV e' lateral:   6.60 cm/s LVOT diam:     2.10 cm  LV E/e' lateral: 18.2 LV SV:         90 LV SV Index:   47 LVOT  Area:     3.46 cm  RIGHT VENTRICLE RV S prime:     11.90 cm/s TAPSE (M-mode): 1.9 cm LEFT ATRIUM             Index       RIGHT ATRIUM          Index LA diam:        3.80 cm 1.98 cm/m  RA Area:     9.72 cm LA Vol (A2C):   49.5 ml 25.82 ml/m RA Volume:   16.80 ml 8.76 ml/m LA Vol (A4C):   41.2 ml 21.49 ml/m LA Biplane Vol: 46.5 ml 24.25 ml/m  AORTIC VALVE AV Area (Vmax):    1.96 cm AV Area (Vmean):   1.91 cm AV Area (VTI):     1.77 cm AV Vmax:           252.25 cm/s AV Vmean:          171.500 cm/s AV VTI:            0.510 m AV Peak Grad:      25.5 mmHg AV Mean Grad:      14.0 mmHg LVOT Vmax:         143.00 cm/s LVOT Vmean:        94.500 cm/s LVOT VTI:          0.261 m LVOT/AV VTI ratio: 0.51  AORTA Ao Root diam: 2.70 cm Ao Asc diam:  2.90 cm MITRAL VALVE                TRICUSPID VALVE MV Area (PHT): 2.62 cm     TR Peak grad:   39.7 mmHg MV Decel Time: 289 msec     TR Vmax:        315.00 cm/s MV E velocity: 120.00 cm/s MV A velocity: 151.00 cm/s  SHUNTS MV E/A ratio:  0.79         Systemic VTI:  0.26 m                             Systemic Diam: 2.10 cm Sanda Klein MD Electronically signed by Sanda Klein MD Signature Date/Time: 07/17/2021/2:02:07 PM    Final    Medications:  sodium chloride 250 mL (07/19/21 0336)    ceFAZolin (ANCEF) IV Stopped (07/18/21 1922)   heparin 1,600 Units/hr (07/19/21 0338)    (  feeding supplement) PROSource Plus  30 mL Oral BID BM   aspirin EC  81 mg Oral Daily   atorvastatin  10 mg Oral q1800   Chlorhexidine Gluconate Cloth  6 each Topical Q0600   darbepoetin (ARANESP) injection - DIALYSIS  40 mcg Intravenous Q Wed-HD   insulin aspart  0-6 Units Subcutaneous Q4H   lanthanum  1,000 mg Oral TID WC   pantoprazole  40 mg Oral Daily   sodium chloride flush  10-40 mL Intracatheter Q12H    Dialysis Orders: MWF at [facility] 3:30hr, 400/A1.5, EDW 90.5kg, 2K/2.5Ca, UFP #2, TDC, heparin 6000 - Hectoral 63mcg IV q HD - No ESA, Hgb > 11   Assessment/Plan:   Sepsis/MSSA bacteremia: Repeat Cx 7/19, 7/20 negative -> on Cefazolin. CXR clear. Currently off of pressors. LS MRI negative for diskitis (chronic DDD). Per ID, holding off on TEE given marked improvement. Plan is for [facility] removal/line holiday.  ESRD: Usual MWF schedule, dialyzed yesterday - for [facility] removal today, then next HD in approx 2 days after new line placement with IR.  Hypotension/volume: Initially required pressors at this time, now off. Home BP meds on hold. Still ~2kg above EDW.  Anemia: Hgb 9.8, continue Aranesp q Wed. Metabolic bone disease: CorrCa and Phos ok, continue home binders, resume VDRA.  Nutrition: Alb low, continue supplements.  Hx DVT/PE: Eliquis on hold, on hep drip for now    Veneta Penton, Hershal Coria 07/19/2021, 9:52 AM  [facility]  I have seen and examined this patient and agree with plan and assessment in the above note with renal recommendations/intervention highlighted. Pt requests sedation for removal and replacement which has delayed its removal.  For removal today by IR.  Broadus John A Rinaldo Macqueen,MD 07/19/2021 11:33 AM

## 2021-07-19 NOTE — Progress Notes (Signed)
PROGRESS NOTE    Whitney Walker  PFX:[id number] DOB: 1956-06-07 DOA: 07/16/2021 PCP: [name], FNP   Brief Narrative:  65 yof w/ past medical hx as provided below; presented to the ED 7/18 w/ cc  gradual onset of constant diffuse achy low back pain which had progressed over several weeks, followed by new onset of nausea, vomiting and diarrhea with fever 100. Hypotensive in the ED - admitted to [facility] given need for pressors. Weaned off pressors 7/19 - improving on vanc and cefepime. Found the have bactermia (MSSA) with source thought to be HD catheter - pulled 7/21 - pending repeat cultures will replace line for HD.  Assessment & Plan:   Active Problems:   ESRD (end stage renal disease) ([place])   Severe sepsis without septic shock ([place])   MSSA bacteremia   Acute low back pain   Septic Shock secondary to MSSA bacteremia, POA Secondary to [facility] for dialysis - MSSA bacteremia in 2/4  blood cultures on 7/18, no growth on repeat culture from 7/19. MRI lumbar spine without signs of abscess osteo. TTE without vegetations  -ID consulted, appreciate assistance - IR consulted for dialysis cath removal after HD - pulled 7/21 - holding replacement per ID and follow repeat cultures - Continue cefazolin - will likely need 2 weeks starting 7/21 given infected catheter removal - TTE reassuring - no need for TEE   ESRD on HD MWF - completed full session Mon 7/18. - Nephrology consulted, appreciate assistance - HD today - catheter holiday given MSSA bacteremia after HD today - Will likely need new access placed 7/22 or 7/23   Hx DVT / PE. - Heparin ongoing - can likely resume eliquis 2.5 in the next 24-48h pending line removal/replacments.   HTN, HLD, PVD, CAD  - LHC from May 2022 with mod disease RCA and Cx, diffuse coronary calcifications, minimal disease LAD, normal LVEF and LVEDP - Continue atorvastatin  - Hold home Amlodipine, Carvedilol.   RUL and LLL pulmonary nodules - stable. -  F/u CT in 12 - 18 months.   Renal cyst - incidentally noted on CT - US findings consistent with simple right  renal cysts  DVT prophylaxis: heparin Code Status: Full Family Communication: None present  Status is: inpt  Dispo: The patient is from: Home              Anticipated d/c is to: TBD              Anticipated d/c date is: 48+h              Patient currently NOT medically stable for discharge  Consultants:  Nephro, PCCM, ID, IR  Antimicrobials:  Cefazolin - ongoing   Subjective: No acute issues/events overnight  Objective: Vitals:   07/18/21 2000 07/18/21 2100 07/18/21 2226 07/19/21 0343  BP: 104/62 108/66 107/65 (!) 93/41  Pulse: (!) 106 (!) 106 (!) 104 94  Resp: 16 (!) 23 18 14   Temp:   99.2 F (37.3 C) 98.4 F (36.9 C)  TempSrc:   Oral Oral  SpO2: (!) 89% 90% 96% 97%  Weight:      Height:        Intake/Output Summary (Last 24 hours) at 07/19/2021 0827 Last data filed at 07/19/2021 0500 Gross per 24 hour  Intake 605.88 ml  Output 1784 ml  Net -1178.12 ml   Filed Weights   07/18/21 0500 07/18/21 1345 07/18/21 1707  Weight: 93.7 kg 93.7 kg 92 kg  Examination:  General exam: Appears calm and comfortable  Respiratory system: Clear to auscultation. Respiratory effort normal. Cardiovascular system: S1 & S2 heard, RRR. R IJ HD cath in place Gastrointestinal system: Abdomen is nondistended, soft and nontender. No organomegaly or masses felt. Normal bowel sounds heard. Central nervous system: Alert and oriented. No focal neurological deficits. Extremities: Symmetric 5 x 5 power. Skin: No rashes, lesions or ulcers Psychiatry: Judgement and insight appear normal. Mood & affect appropriate.   Data Reviewed: I have personally reviewed following labs and imaging studies  CBC: Recent Labs  Lab 07/16/21 1402 07/17/21 0408 07/18/21 0341 07/18/21 2345  WBC 26.9* 29.1* 20.4* 14.3*  NEUTROABS 23.8*  --   --   --   HGB 11.9* 10.7* 8.1* 9.8*  HCT 37.8 34.8*  25.6* 30.0*  MCV 92.2 94.3 92.4 89.8  PLT 143* 146* 159 607*   Basic Metabolic Panel: Recent Labs  Lab 07/16/21 1402 07/17/21 0408 07/18/21 1430 07/18/21 2345  NA 132* 131* 133* 131*  K 4.8 4.9 4.5 3.5  CL 94* 101 104 98  CO2 19* 15* 14* 21*  GLUCOSE 99 131* 113* 138*  BUN 24* 30* 50* 23  CREATININE 7.31* 7.20* 8.91* 5.80*  CALCIUM 8.2* 7.3* 7.0* 6.5*  MG  --  1.7  --   --   PHOS  --  6.5* 5.9*  --    GFR: Estimated Creatinine Clearance: 9.9 mL/min (A) (by C-G formula based on SCr of 5.8 mg/dL (H)). Liver Function Tests: Recent Labs  Lab 07/16/21 1402 07/18/21 1430  AST 39  --   ALT 21  --   ALKPHOS 93  --   BILITOT 1.2  --   PROT 7.8  --   ALBUMIN 2.7* 2.0*   No results for input(s): LIPASE, AMYLASE in the last 168 hours. No results for input(s): AMMONIA in the last 168 hours. Coagulation Profile: Recent Labs  Lab 07/16/21 1402  INR 1.3*   Cardiac Enzymes: No results for input(s): CKTOTAL, CKMB, CKMBINDEX, TROPONINI in the last 168 hours. BNP (last 3 results) No results for input(s): PROBNP in the last 8760 hours. HbA1C: Recent Labs    07/16/21 1402  HGBA1C 6.2*   CBG: Recent Labs  Lab 07/18/21 0338 07/18/21 0739 07/18/21 1139 07/18/21 1827 07/19/21 0804  GLUCAP 115* 110* 120* 76 96   Lipid Profile: No results for input(s): CHOL, HDL, LDLCALC, TRIG, CHOLHDL, LDLDIRECT in the last 72 hours. Thyroid Function Tests: No results for input(s): TSH, T4TOTAL, FREET4, T3FREE, THYROIDAB in the last 72 hours. Anemia Panel: No results for input(s): VITAMINB12, FOLATE, FERRITIN, TIBC, IRON, RETICCTPCT in the last 72 hours. Sepsis Labs: Recent Labs  Lab 07/16/21 1402 07/16/21 1510  LATICACIDVEN 3.3* 1.7    Recent Results (from the past 240 hour(s))  Blood Culture (routine x 2)     Status: None (Preliminary result)   Collection Time: 07/16/21  1:15 PM   Specimen: BLOOD  Result Value Ref Range Status   Specimen Description BLOOD BLOOD RIGHT FOREARM   Final   Special Requests   Final    BOTTLES DRAWN AEROBIC AND ANAEROBIC Blood Culture adequate volume   Culture   Final    NO GROWTH 2 DAYS Performed at [facility], [address], [place], [place] [address]    Report Status PENDING  Incomplete  Resp Panel by RT-PCR (Flu A&B, Covid) Nasopharyngeal Swab     Status: None   Collection Time: 07/16/21  1:17 PM   Specimen:  Nasopharyngeal Swab; Nasopharyngeal(NP) swabs in vial transport medium  Result Value Ref Range Status   SARS Coronavirus 2 by RT PCR NEGATIVE NEGATIVE Final    Comment: (NOTE) SARS-CoV-2 target nucleic acids are NOT DETECTED.  The SARS-CoV-2 RNA is generally detectable in upper respiratory specimens during the acute phase of infection. The lowest concentration of SARS-CoV-2 viral copies this assay can detect is 138 copies/mL. A negative result does not preclude SARS-Cov-2 infection and should not be used as the sole basis for treatment or other patient management decisions. A negative result may occur with  improper specimen collection/handling, submission of specimen other than nasopharyngeal swab, presence of viral mutation(s) within the areas targeted by this assay, and inadequate number of viral copies(<138 copies/mL). A negative result must be combined with clinical observations, patient history, and epidemiological information. The expected result is Negative.  Fact Sheet for Patients:  EntrepreneurPulse.com.au  Fact Sheet for Healthcare Providers:  IncredibleEmployment.be  This test is no t yet approved or cleared by the [place] FDA and  has been authorized for detection and/or diagnosis of SARS-CoV-2 by FDA under an Emergency Use Authorization (EUA). This EUA will remain  in effect (meaning this test can be used) for the duration of the COVID-19 declaration under Section 564(b)(1) of the Act, 21 U.S.C.section 360bbb-3(b)(1), unless the authorization is  terminated  or revoked sooner.       Influenza A by PCR NEGATIVE NEGATIVE Final   Influenza B by PCR NEGATIVE NEGATIVE Final    Comment: (NOTE) The Xpert Xpress SARS-CoV-2/FLU/RSV plus assay is intended as an aid in the diagnosis of influenza from Nasopharyngeal swab specimens and should not be used as a sole basis for treatment. Nasal washings and aspirates are unacceptable for Xpert Xpress SARS-CoV-2/FLU/RSV testing.  Fact Sheet for Patients: EntrepreneurPulse.com.au  Fact Sheet for Healthcare Providers: IncredibleEmployment.be  This test is not yet approved or cleared by the [place] FDA and has been authorized for detection and/or diagnosis of SARS-CoV-2 by FDA under an Emergency Use Authorization (EUA). This EUA will remain in effect (meaning this test can be used) for the duration of the COVID-19 declaration under Section 564(b)(1) of the Act, 21 U.S.C. section 360bbb-3(b)(1), unless the authorization is terminated or revoked.  Performed at [facility], [place] [address] [place], [place] [address]   Blood Culture (routine x 2)     Status: Abnormal   Collection Time: 07/16/21  2:04 PM   Specimen: BLOOD RIGHT WRIST  Result Value Ref Range Status   Specimen Description BLOOD RIGHT WRIST  Final   Special Requests   Final    BOTTLES DRAWN AEROBIC AND ANAEROBIC Blood Culture results may not be optimal due to an inadequate volume of blood received in culture bottles   Culture  Setup Time   Final    GRAM POSITIVE COCCI IN CLUSTERS IN BOTH AEROBIC AND ANAEROBIC BOTTLES CRITICAL RESULT CALLED TO, READ BACK BY AND VERIFIED WITHEather Colas PHARMD, AT 4431 07/17/21 [place] Performed at [facility], [place] [address], [place], [place] [address]    Culture STAPHYLOCOCCUS AUREUS (A)  Final   Report Status 07/19/2021 FINAL  Final   Organism ID, Bacteria STAPHYLOCOCCUS AUREUS  Final      Susceptibility   Staphylococcus aureus -  MIC*    CIPROFLOXACIN <=0.5 SENSITIVE Sensitive     ERYTHROMYCIN <=0.25 SENSITIVE Sensitive     GENTAMICIN <=0.5 SENSITIVE Sensitive     OXACILLIN <=0.25 SENSITIVE Sensitive  TETRACYCLINE <=1 SENSITIVE Sensitive     VANCOMYCIN <=0.5 SENSITIVE Sensitive     TRIMETH/SULFA 160 RESISTANT Resistant     CLINDAMYCIN <=0.25 SENSITIVE Sensitive     RIFAMPIN <=0.5 SENSITIVE Sensitive     Inducible Clindamycin NEGATIVE Sensitive     * STAPHYLOCOCCUS AUREUS  Blood Culture ID Panel (Reflexed)     Status: Abnormal   Collection Time: 07/16/21  2:04 PM  Result Value Ref Range Status   Enterococcus faecalis NOT DETECTED NOT DETECTED Final   Enterococcus Faecium NOT DETECTED NOT DETECTED Final   Listeria monocytogenes NOT DETECTED NOT DETECTED Final   Staphylococcus species DETECTED (A) NOT DETECTED Final    Comment: CRITICAL RESULT CALLED TO, READ BACK BY AND VERIFIED WITH: C. AMEND PHARMD, AT 0450 07/17/21 D. VANHOOK    Staphylococcus aureus (BCID) DETECTED (A) NOT DETECTED Final    Comment: CRITICAL RESULT CALLED TO, READ BACK BY AND VERIFIED WITH: C. AMEND PHARMD, AT 9983 07/17/21 D. VANHOOK    Staphylococcus epidermidis NOT DETECTED NOT DETECTED Final   Staphylococcus lugdunensis NOT DETECTED NOT DETECTED Final   Streptococcus species NOT DETECTED NOT DETECTED Final   Streptococcus agalactiae NOT DETECTED NOT DETECTED Final   Streptococcus pneumoniae NOT DETECTED NOT DETECTED Final   Streptococcus pyogenes NOT DETECTED NOT DETECTED Final   A.calcoaceticus-baumannii NOT DETECTED NOT DETECTED Final   Bacteroides fragilis NOT DETECTED NOT DETECTED Final   Enterobacterales NOT DETECTED NOT DETECTED Final   Enterobacter cloacae complex NOT DETECTED NOT DETECTED Final   Escherichia coli NOT DETECTED NOT DETECTED Final   Klebsiella aerogenes NOT DETECTED NOT DETECTED Final   Klebsiella oxytoca NOT DETECTED NOT DETECTED Final   Klebsiella pneumoniae NOT DETECTED NOT DETECTED Final   Proteus  species NOT DETECTED NOT DETECTED Final   Salmonella species NOT DETECTED NOT DETECTED Final   Serratia marcescens NOT DETECTED NOT DETECTED Final   Haemophilus influenzae NOT DETECTED NOT DETECTED Final   Neisseria meningitidis NOT DETECTED NOT DETECTED Final   Pseudomonas aeruginosa NOT DETECTED NOT DETECTED Final   Stenotrophomonas maltophilia NOT DETECTED NOT DETECTED Final   Candida albicans NOT DETECTED NOT DETECTED Final   Candida auris NOT DETECTED NOT DETECTED Final   Candida glabrata NOT DETECTED NOT DETECTED Final   Candida krusei NOT DETECTED NOT DETECTED Final   Candida parapsilosis NOT DETECTED NOT DETECTED Final   Candida tropicalis NOT DETECTED NOT DETECTED Final   Cryptococcus neoformans/gattii NOT DETECTED NOT DETECTED Final   Meth resistant mecA/C and MREJ NOT DETECTED NOT DETECTED Final    Comment: Performed at [facility], [address] [place], [place] [address]  Gastrointestinal Panel by PCR , Stool     Status: None   Collection Time: 07/16/21  3:08 PM   Specimen: STOOL  Result Value Ref Range Status   Campylobacter species NOT DETECTED NOT DETECTED Final   Plesimonas shigelloides NOT DETECTED NOT DETECTED Final   Salmonella species NOT DETECTED NOT DETECTED Final   Yersinia enterocolitica NOT DETECTED NOT DETECTED Final   Vibrio species NOT DETECTED NOT DETECTED Final   Vibrio cholerae NOT DETECTED NOT DETECTED Final   Enteroaggregative E coli (EAEC) NOT DETECTED NOT DETECTED Final   Enteropathogenic E coli (EPEC) NOT DETECTED NOT DETECTED Final   Enterotoxigenic E coli (ETEC) NOT DETECTED NOT DETECTED Final   Shiga like toxin producing E coli (STEC) NOT DETECTED NOT DETECTED Final   Shigella/Enteroinvasive E coli (EIEC) NOT DETECTED NOT DETECTED Final   Cryptosporidium NOT DETECTED NOT DETECTED  Final   Cyclospora cayetanensis NOT DETECTED NOT DETECTED Final   Entamoeba histolytica NOT DETECTED NOT DETECTED Final   Giardia lamblia NOT DETECTED NOT  DETECTED Final   Adenovirus F40/41 NOT DETECTED NOT DETECTED Final   Astrovirus NOT DETECTED NOT DETECTED Final   Norovirus GI/GII NOT DETECTED NOT DETECTED Final   Rotavirus A NOT DETECTED NOT DETECTED Final   Sapovirus (I, II, IV, and V) NOT DETECTED NOT DETECTED Final    Comment: Performed at [facility], [place], [place] [address]  C Difficile Quick Screen w PCR reflex     Status: None   Collection Time: 07/16/21  3:08 PM   Specimen: STOOL  Result Value Ref Range Status   C Diff antigen NEGATIVE NEGATIVE Final   C Diff toxin NEGATIVE NEGATIVE Final   C Diff interpretation No C. difficile detected.  Final    Comment: Performed at [facility], [place] [address], [place], [place] [address]  Culture, blood (Routine X 2) w Reflex to ID Panel     Status: None (Preliminary result)   Collection Time: 07/17/21  8:45 AM   Specimen: BLOOD RIGHT ARM  Result Value Ref Range Status   Specimen Description BLOOD RIGHT ARM  Final   Special Requests   Final    BOTTLES DRAWN AEROBIC AND ANAEROBIC Blood Culture adequate volume   Culture   Final    NO GROWTH < 24 HOURS Performed at [facility], [place] [address] [place], [place] [address]    Report Status PENDING  Incomplete  Culture, blood (Routine X 2) w Reflex to ID Panel     Status: None (Preliminary result)   Collection Time: 07/17/21  8:55 AM   Specimen: BLOOD LEFT HAND  Result Value Ref Range Status   Specimen Description BLOOD LEFT HAND  Final   Special Requests   Final    BOTTLES DRAWN AEROBIC AND ANAEROBIC Blood Culture adequate volume   Culture   Final    NO GROWTH < 24 HOURS Performed at [facility], [place] [address] [place],  [address]    Report Status PENDING  Incomplete         Radiology Studies: MR LUMBAR SPINE WO CONTRAST  Result Date: 07/18/2021 CLINICAL DATA:  Epidural abscess suspected. Additional provided: Bilateral flank pain, worsening for several weeks, dialysis  patient, patient unable to walk due to pain. EXAM: MRI LUMBAR SPINE WITHOUT CONTRAST TECHNIQUE: Multiplanar, multisequence MR imaging of the lumbar spine was performed. No intravenous contrast was administered. COMPARISON:  Radiographs of the lumbar spine 05/08/2020. FINDINGS: Segmentation: 5 lumbar vertebrae. The caudal most well-formed intervertebral disc space is designated L5-S1. Alignment:  Trace T11-T12, L1-L2 and L3-L4 grade 1 retrolisthesis. Vertebrae: There are Schmorl nodes within the T12 inferior endplate, L1 inferior endplate, L3 superior endplate and L3 inferior endplate. There is minimal marrow edema surrounding the Schmorl node within the T12 inferior endplate, and this Schmorl node may be acute or subacute. Trace multilevel endplate edema elsewhere, which appear degenerative. Mild edema within the right L5 articular pillar also appears degenerative (series 3, image 5). No specific findings to suggest discitis/osteomyelitis. Conus medullaris and cauda equina: Conus extends to the L1-L2 level. No signal abnormality within the visualized distal spinal cord. No epidural abscess is identified on this noncontrast examination. Paraspinal and other soft tissues: Atrophic kidneys. Atrophy of the lumbar paraspinal musculature. Additionally, there is nonspecific edema signal within the subcutaneous fat overlying the thoracolumbar spine  bilaterally. Disc levels: Mild disc degeneration throughout the lumbar and visualized lower thoracic spine. T11-T12: Imaged sagittally. Trace grade 1 retrolisthesis. Small disc bulge. Mild partial effacement of the ventral thecal sac without spinal cord mass effect. No appreciable significant foraminal stenosis. T12-L1: Imaged sagittally. Minimal disc bulge. No significant spinal canal or foraminal stenosis. L1-L2: Trace grade 1 retrolisthesis. No significant disc herniation or stenosis. L2-L3: Shallow left subarticular/foraminal disc protrusion. Minimal left subarticular  narrowing without appreciable nerve root impingement. Central canal patent. No significant foraminal stenosis. L3-L4: Trace grade 1 retrolisthesis. Disc bulge. Superimposed broad-based central disc protrusion eccentric to the right. Mild facet arthrosis/ligamentum flavum hypertrophy. Trace right facet joint effusion. Mild bilateral subarticular and central canal narrowing without frank nerve root impingement. Mild relative bilateral neural foraminal narrowing. L4-L5: Disc bulge. Superimposed broad-based central disc protrusion at site of posterior annular fissure, eccentric to the right. Mild facet arthrosis/ligamentum flavum hypertrophy. Trace right facet joint effusion. The disc protrusion contributes to bilateral subarticular narrowing (mild/moderate right, mild left), contacting and crowding the descending right L5 nerve root (series 5, image 23). Mild relative narrowing of the central canal. Bilateral neural foraminal narrowing (mild/moderate right, mild left). L5-S1: Disc bulge with endplate spurring. Superimposed broad-based central disc protrusion at site of posterior annular fissure. Mild facet arthrosis. The disc protrusion contributes to bilateral subarticular stenosis (moderate right, mild left). The disc protrusion contacts and crowds the descending right S1 nerve root. The disc protrusion may also contact the descending left S1 nerve root. Mild relative narrowing of the central canal. No significant foraminal stenosis. IMPRESSION: No specific findings to suggest discitis/osteomyelitis at this time. No evidence of epidural abscess on this non-contrast study. Schmorl nodes within the T12 inferior endplate, L1 inferior endplate, L3 superior endplate and L3 inferior endplate. There is mild marrow edema surrounding the Schmorl node within the T12 inferior endplate, and this Schmorl node may be acute/subacute. Minimal edema elsewhere, as described and degenerative in appearance. Lumbar spondylosis, as  outlined and with findings most notably as follows. At L4-L5, there is a broad-based central disc protrusion at site of posterior annular fissure. This contributes to multifactorial bilateral subarticular stenosis (mild/moderate right, mild left), contacting and crowding the descending right L5 nerve root. Mild central canal narrowing. Bilateral neural foraminal narrowing (mild/moderate right, mild left). Trace right facet joint effusion. At L5-S1, a broad-based central disc protrusion at site of posterior annular fissure contributes to multifactorial bilateral subarticular stenosis (moderate right, mild left). The disc protrusion contacts and crowds the descending right S1 nerve root. The disc protrusion may also contact the descending left S1 nerve root. Mild relative narrowing of the central canal. No more than mild spinal canal or neural foraminal narrowing at the remaining levels. Trace right facet joint effusion also present at L3-L4. Electronically Signed   By: Kellie Simmering DO   On: 07/18/2021 14:05   ECHOCARDIOGRAM COMPLETE  Result Date: 07/17/2021    ECHOCARDIOGRAM REPORT   Patient Name:   [facility] Date of Exam: 07/17/2021 Medical Rec #:  [id number]            Height:       61.0 in Accession #:    [id number]           Weight:       207.2 lb Date of Birth:  1956-08-07             BSA:          1.917 m Patient Age:    65  years             BP:           137/58 mmHg Patient Gender: F                    HR:           89 bpm. Exam Location:  Inpatient Procedure: 2D Echo, Cardiac Doppler and Color Doppler Indications:    Endocarditis  History:        Patient has prior history of Echocardiogram examinations, most                 recent 04/08/2021. CHF, CAD, COPD; Risk Factors:Hypertension and                 Diabetes.  Sonographer:    Bernadene Person RDCS Referring Phys: [id number] Kipp Brood  Sonographer Comments: Image acquisition challenging due to uncooperative patient. IMPRESSIONS  1. Left  ventricular ejection fraction, by estimation, is 70 to 75%. The left ventricle has hyperdynamic function. The left ventricle has no regional wall motion abnormalities. Left ventricular diastolic parameters are consistent with Grade I diastolic dysfunction (impaired relaxation). Elevated left atrial pressure.  2. Right ventricular systolic function is normal. The right ventricular size is normal. The estimated right ventricular systolic pressure is 93.2 mmHg.  3. Left atrial size was mildly dilated.  4. The mitral valve is normal in structure. No evidence of mitral valve regurgitation. Moderate mitral annular calcification.  5. The aortic valve is tricuspid. There is mild calcification of the aortic valve. There is mild thickening of the aortic valve. Aortic valve regurgitation is not visualized. Mild to moderate aortic valve sclerosis/calcification is present, without any evidence of aortic stenosis. Aortic valve mean gradient measures 14.0 mmHg. Aortic valve Vmax measures 2.52 m/s.  6. The inferior vena cava is normal in size with greater than 50% respiratory variability, suggesting right atrial pressure of 3 mmHg. Comparison(s): Prior images unable to be directly viewed, comparison made by report only. Conclusion(s)/Recommendation(s): No evidence of valvular vegetations on this transthoracic echocardiogram. Would recommend a transesophageal echocardiogram to exclude infective endocarditis if clinically indicated. FINDINGS  Left Ventricle: Left ventricular ejection fraction, by estimation, is 70 to 75%. The left ventricle has hyperdynamic function. The left ventricle has no regional wall motion abnormalities. The left ventricular internal cavity size was normal in size. There is no left ventricular hypertrophy. Left ventricular diastolic parameters are consistent with Grade I diastolic dysfunction (impaired relaxation). Elevated left atrial pressure. Right Ventricle: The right ventricular size is normal. No  increase in right ventricular wall thickness. Right ventricular systolic function is normal. The tricuspid regurgitant velocity is 3.15 m/s, and with an assumed right atrial pressure of 3 mmHg, the estimated right ventricular systolic pressure is 35.5 mmHg. Left Atrium: Left atrial size was mildly dilated. Right Atrium: Right atrial size was normal in size. Pericardium: There is no evidence of pericardial effusion. Mitral Valve: The mitral valve is normal in structure. Moderate mitral annular calcification. No evidence of mitral valve regurgitation. Tricuspid Valve: The tricuspid valve is normal in structure. Tricuspid valve regurgitation is trivial. Aortic Valve: Borderline criteria for aortic valve stenosis, gradients are exaggerated by hyperdynamic LV function. The aortic valve is tricuspid. There is mild calcification of the aortic valve. There is mild thickening of the aortic valve. Aortic valve  regurgitation is not visualized. Mild to moderate aortic valve sclerosis/calcification is present, without any evidence of aortic stenosis. Aortic valve mean gradient measures 14.0  mmHg. Aortic valve peak gradient measures 25.5 mmHg. Aortic valve area, by VTI measures 1.77 cm. Pulmonic Valve: The pulmonic valve was grossly normal. Pulmonic valve regurgitation is mild. Aorta: The aortic root is normal in size and structure. Venous: The inferior vena cava is normal in size with greater than 50% respiratory variability, suggesting right atrial pressure of 3 mmHg. IAS/Shunts: No atrial level shunt detected by color flow Doppler.  LEFT VENTRICLE PLAX 2D LVIDd:         4.10 cm  Diastology LVIDs:         2.50 cm  LV e' medial:    7.13 cm/s LV PW:         1.10 cm  LV E/e' medial:  16.8 LV IVS:        1.10 cm  LV e' lateral:   6.60 cm/s LVOT diam:     2.10 cm  LV E/e' lateral: 18.2 LV SV:         90 LV SV Index:   47 LVOT Area:     3.46 cm  RIGHT VENTRICLE RV S prime:     11.90 cm/s TAPSE (M-mode): 1.9 cm LEFT ATRIUM              Index       RIGHT ATRIUM          Index LA diam:        3.80 cm 1.98 cm/m  RA Area:     9.72 cm LA Vol (A2C):   49.5 ml 25.82 ml/m RA Volume:   16.80 ml 8.76 ml/m LA Vol (A4C):   41.2 ml 21.49 ml/m LA Biplane Vol: 46.5 ml 24.25 ml/m  AORTIC VALVE AV Area (Vmax):    1.96 cm AV Area (Vmean):   1.91 cm AV Area (VTI):     1.77 cm AV Vmax:           252.25 cm/s AV Vmean:          171.500 cm/s AV VTI:            0.510 m AV Peak Grad:      25.5 mmHg AV Mean Grad:      14.0 mmHg LVOT Vmax:         143.00 cm/s LVOT Vmean:        94.500 cm/s LVOT VTI:          0.261 m LVOT/AV VTI ratio: 0.51  AORTA Ao Root diam: 2.70 cm Ao Asc diam:  2.90 cm MITRAL VALVE                TRICUSPID VALVE MV Area (PHT): 2.62 cm     TR Peak grad:   39.7 mmHg MV Decel Time: 289 msec     TR Vmax:        315.00 cm/s MV E velocity: 120.00 cm/s MV A velocity: 151.00 cm/s  SHUNTS MV E/A ratio:  0.79         Systemic VTI:  0.26 m                             Systemic Diam: 2.10 cm Dani Gobble Croitoru MD Electronically signed by Sanda Klein MD Signature Date/Time: 07/17/2021/2:02:07 PM    Final    [place] CHEST SOFT TISSUE  Result Date: 07/17/2021 CLINICAL DATA:  Evaluate AV fistula abscess. Fever and bacteremia with painful catheter EXAM: ULTRASOUND OF right chest SOFT TISSUES TECHNIQUE: Ultrasound examination was  performed in the area of clinical concern. COMPARISON:  None similar FINDINGS: Dialysis catheter in the right chest which had an unremarkable appearance on recent chest x-ray. There is fluid encompassing the catheter along the upper aspect of the subcutaneous portion collection measuring 3-4 mm in maximal thickness. No separate abscess is seen. IMPRESSION: Perma catheter on the right. A thin amount of fluid encompasses the upper subcutaneous catheter, history suggesting purulence. Electronically Signed   By: Monte Fantasia M.D.   On: 07/17/2021 11:38        Scheduled Meds:  (feeding supplement) PROSource Plus  30 mL Oral BID BM    aspirin EC  81 mg Oral Daily   atorvastatin  10 mg Oral q1800   Chlorhexidine Gluconate Cloth  6 each Topical Q0600   darbepoetin (ARANESP) injection - DIALYSIS  40 mcg Intravenous Q Wed-HD   insulin aspart  0-6 Units Subcutaneous Q4H   lanthanum  1,000 mg Oral TID WC   pantoprazole  40 mg Oral Daily   sodium chloride flush  10-40 mL Intracatheter Q12H   Continuous Infusions:  sodium chloride 250 mL (07/19/21 0336)    ceFAZolin (ANCEF) IV Stopped (07/18/21 1922)   heparin 1,600 Units/hr (07/19/21 0338)     LOS: 3 days   Time spent: 35 min  Little Ishikawa, DO [facility]  If 7PM-7AM, please contact night-coverage [email or web address]  07/19/2021, 8:27 AM

## 2021-07-19 NOTE — Procedures (Signed)
Pre procedural Dx: ESRD, bacteremia Post procedural Dx: Same  Successful removal of tunneled right chest HD catheter.   EBL < 5 mL No immediate complications.  Please see imaging section of Epic for full dictation.  Joaquim Nam PA-C 07/19/2021 2:09 PM

## 2021-07-19 NOTE — Care Management Important Message (Signed)
Important Message  Patient Details  Name: Whitney Walker MRN: [id number] Date of Birth: [birth year]   Medicare Important Message Given:  Yes     Orbie Pyo 07/19/2021, 4:02 PM

## 2021-07-19 NOTE — Consult Note (Addendum)
   [facility] CM Inpatient Consult   07/19/2021  Shaindy Reader [birth year] [id number]  [place] [facility] [[facility]] Patient: [employer]  Addendum: Primary Care Provider:  [name], [place] [facility]  Patient is showing as extreme high risk score for unplanned readmission risk  Patient is active in an Embedded practice in a  chronic disease management Embedded Care Management team[Embedded RN and Pharmacist noted.  Plan: Will follow for progress and have notification sent to the [place] Management team for updates and made aware of disposition needs.   Please contact for further questions,  Natividad Brood, RN BSN [facility] Liaison  [phone] business mobile phone Toll free office [phone]  Fax number: [phone] [email or web address]

## 2021-07-19 NOTE — Evaluation (Signed)
Physical Therapy Evaluation Patient Details Name: Whitney Walker MRN: [id number] DOB: [birth year] Today's Date: 07/19/2021   History of Present Illness  65 year old woman presented to ED 07/16/21 with gradual onset of constant diffuse achy low back pain which had progressed over several weeks, followed by new onset of nausea, vomiting and diarrhea with fever 100. Found to be in septic shock admitted requiring titration of fluids and vasopressors. PMH: OSA; PVD; ESRD on MWF HD; DVT/PE; CAD; COPD; and colon cancer.  Clinical Impression  PTA pt living alone in apartment with single step to enter. Pt reports a month ago that she was independent in community level ambulation and independent with bADLs and iADLs, but has been limited by progressively worsening pain in her L hip. On entry pt report she has just had pain medication and has no pain. Pain did not increase with mobility. Pt is currently mod I for bed mobility, min guard for transfers and min guard- supervision for ambulation. PT recommending HHPT to improve strength and mobility to PLOF. PT will continue to follow acutely.     Follow Up Recommendations Home health PT;Supervision - Intermittent    Equipment Recommendations  None recommended by PT (has necesary equipment)    Recommendations for Other Services OT consult     Precautions / Restrictions Precautions Precautions: None Restrictions Weight Bearing Restrictions: No      Mobility  Bed Mobility Overal bed mobility: Modified Independent             General bed mobility comments: HoB slightly elevated    Transfers Overall transfer level: Needs assistance Equipment used: Rolling walker (2 wheeled) Transfers: Sit to/from Stand Sit to Stand: Min guard         General transfer comment: min guard for safety, vc for hand placement for power up,  Ambulation/Gait Ambulation/Gait assistance: Min guard;Supervision Gait Distance (Feet): 25 Feet Assistive device:  Rolling walker (2 wheeled) Gait Pattern/deviations: Step-through pattern;Trunk flexed;Shuffle Gait velocity: slowed Gait velocity interpretation: <1.31 ft/sec, indicative of household ambulator General Gait Details: min guard progressing to supervision with slow shuffling gait, no increase in hip pain with ambulation        Balance Overall balance assessment: Needs assistance Sitting-balance support: Feet supported;No upper extremity supported Sitting balance-Leahy Scale: Good     Standing balance support: Single extremity supported;Bilateral upper extremity supported;During functional activity;No upper extremity supported Standing balance-Leahy Scale: Fair Standing balance comment: benefits from UE support                             Pertinent Vitals/Pain Pain Assessment: No/denies pain Pain Intervention(s): Premedicated before session    Home Living Family/patient expects to be discharged to:: Private residence Living Arrangements: Alone Available Help at Discharge: Family;Available PRN/intermittently Type of Home: Apartment Home Access: Stairs to enter Entrance Stairs-Rails: Right;Left Entrance Stairs-Number of Steps: 1 step up in front Home Layout: One level Home Equipment: Clinical cytogeneticist - 2 wheels;Walker - 4 wheels Additional Comments: states shower seat does not fit in her tub, tub soaks rather than shower due to HD cath    Prior Function Level of Independence: Needs assistance   Gait / Transfers Assistance Needed: uses RW vs no AD depending on pain, a month ago was indepedent with mobility,  ADL's / Homemaking Assistance Needed: a month ago independent with bADLs and iADLs  Comments: has transportation to HD     Hand Dominance   Dominant Hand: Right  Extremity/Trunk Assessment   Upper Extremity Assessment Upper Extremity Assessment: Overall WFL for tasks assessed    Lower Extremity Assessment Lower Extremity Assessment: LLE  deficits/detail LLE Deficits / Details: ROM WFL, strength grossly 3+/5       Communication   Communication: No difficulties  Cognition Arousal/Alertness: Awake/alert Behavior During Therapy: WFL for tasks assessed/performed Overall Cognitive Status: Within Functional Limits for tasks assessed                                        General Comments General comments (skin integrity, edema, etc.): at rest SaO2 on 2L O2 SaO2 94%O2, on RA 92%O2, with mobility SaO2 dropped to 80%O2, able to recover to 89%O2 within a minute returned to supplemental O2 of 2L O2 via [place] as pt is having sedated procedure today        Assessment/Plan    PT Assessment Patient needs continued PT services  PT Problem List Decreased strength;Decreased activity tolerance;Decreased balance;Decreased mobility;Cardiopulmonary status limiting activity       PT Treatment Interventions DME instruction;Gait training;Functional mobility training;Therapeutic activities;Therapeutic exercise;Balance training;Cognitive remediation;Patient/family education    PT Goals (Current goals can be found in the Care Plan section)  Acute Rehab PT Goals Patient Stated Goal: independent ambulation PT Goal Formulation: With patient Time For Goal Achievement: 08/02/21 Potential to Achieve Goals: Good    Frequency Min 3X/week    AM-PAC PT "6 Clicks" Mobility  Outcome Measure Help needed turning from your back to your side while in a flat bed without using bedrails?: None Help needed moving from lying on your back to sitting on the side of a flat bed without using bedrails?: None Help needed moving to and from a bed to a chair (including a wheelchair)?: None Help needed standing up from a chair using your arms (e.g., wheelchair or bedside chair)?: None Help needed to walk in hospital room?: None Help needed climbing 3-5 steps with a railing? : A Little 6 Click Score: 23    End of Session Equipment Utilized During  Treatment: Gait belt;Oxygen Activity Tolerance: Patient tolerated treatment well Patient left: in chair;with call bell/phone within reach;with chair alarm set Nurse Communication: Mobility status;Other (comment) (O2 drop with mobility on RA) PT Visit Diagnosis: Other abnormalities of gait and mobility (R26.89);Muscle weakness (generalized) (M62.81);Difficulty in walking, not elsewhere classified (R26.2)    Time: 7902-4097 PT Time Calculation (min) (ACUTE ONLY): 30 min   Charges:   PT Evaluation $PT Eval Moderate Complexity: 1 Mod PT Treatments $Therapeutic Activity: 8-22 mins        Nadav Swindell B. Migdalia Dk PT, DPT Acute Rehabilitation Services Pager [phone] Office [phone]   [place] 07/19/2021, 11:20 AM

## 2021-07-19 NOTE — Progress Notes (Signed)
ANTICOAGULATION CONSULT NOTE - Follow Up Consult  Pharmacy Consult for heparin Indication:  h/o VTE  Labs: Recent Labs    07/16/21 1402 07/16/21 1402 07/17/21 0408 07/17/21 0086 07/17/21 1414 07/18/21 0341 07/18/21 1430 07/18/21 1431 07/18/21 2345  HGB 11.9*  --  10.7*  --   --  8.1*  --   --   --   HCT 37.8  --  34.8*  --   --  25.6*  --   --   --   PLT 143*  --  146*  --   --  159  --   --   --   APTT 24  --  41*  --  45*  --   --   --   --   LABPROT 16.5*  --   --   --   --   --   --   --   --   INR 1.3*  --   --   --   --   --   --   --   --   HEPARINUNFRC  --    < > 0.16*  --  0.16* 0.19*  --  0.40 0.49  CREATININE 7.31*  --  7.20*  --   --   --  8.91*  --  5.80*  TROPONINIHS  --   --  83* 87*  --   --   --   --   --    < > = values in this interval not displayed.    Assessment/Plan:  65yo female remains therapeutic on heparin. Will continue gtt at current rate of 1600 units/hr and monitor daily level.   Wynona Neat, PharmD, BCPS  07/19/2021,12:31 AM

## 2021-07-19 NOTE — H&P (Signed)
Patient Status: [name] - In-pt  Assessment and Plan:  65 y/o F with MSSA bacteremia and concern for tunneled HD catheter as infectious source, IR consulted for tunneled HD catheter removal with moderate sedation per patient request.   Risks and benefits discussed with the patient including, but not limited to bleeding, infection, vascular injury, pneumothorax which may require chest tube placement, air embolism or even death.  All of the patient's questions were answered, patient is agreeable to proceed.  Consent signed and in chart.  ______________________________________________________________________   History of Present Illness: Whitney Walker is a 65 y.o. female with history of ESRD on HD via right chest tunneled HD catheter currently admitted with MSSA bacteremia. IR has been consulted for tunneled HD catheter removal with moderate sedation (per patient request) for line holiday.  Whitney Walker reports previous right sided HD catheter infection 2-3 years ago that was removed which she states was very traumatic for her and is the reason she wants to be sedated. She denies any tenderness, swelling or purulent drainage from the catheter.   Allergies and medications reviewed.   Review of Systems: A 12 point ROS discussed and pertinent positives are indicated in the HPI above.  All other systems are negative.  Review of Systems  Constitutional:  Negative for chills and fever.  Respiratory:  Negative for cough and shortness of breath.   Cardiovascular:  Negative for chest pain.  Gastrointestinal:  Negative for abdominal pain, nausea and vomiting.  Neurological:  Negative for headaches.   Vital Signs: BP 97/60   Pulse 88   Temp 98.2 F (36.8 C) (Oral)   Resp 18   Ht 5\' 1"  (1.549 m)   Wt 202 lb 13.2 oz (92 kg)   SpO2 100%   BMI 38.32 kg/m   Physical Exam Vitals and nursing note reviewed.  Constitutional:      General: She is not in acute distress. HENT:     Head:  Normocephalic.  Cardiovascular:     Rate and Rhythm: Normal rate and regular rhythm.     Comments: (+) right chest HD catheter. Dressed appropriately, non tender, no erythema/edema/drainage/bleeding. Pulmonary:     Effort: Pulmonary effort is normal.     Breath sounds: Normal breath sounds.  Abdominal:     Palpations: Abdomen is soft.  Skin:    General: Skin is warm and dry.  Neurological:     Mental Status: She is alert and oriented to person, place, and time.  Psychiatric:        Mood and Affect: Mood normal.        Behavior: Behavior normal.        Thought Content: Thought content normal.        Judgment: Judgment normal.     Imaging reviewed.   Labs:  COAGS: Recent Labs    05/28/21 0842 07/16/21 1402 07/17/21 0408 07/17/21 1414  INR 1.2 1.3*  --   --   APTT  --  24 41* 45*    BMP: Recent Labs    07/16/21 1402 07/17/21 0408 07/18/21 1430 07/18/21 2345  NA 132* 131* 133* 131*  K 4.8 4.9 4.5 3.5  CL 94* 101 104 98  CO2 19* 15* 14* 21*  GLUCOSE 99 131* 113* 138*  BUN 24* 30* 50* 23  CALCIUM 8.2* 7.3* 7.0* 6.5*  CREATININE 7.31* 7.20* 8.91* 5.80*  GFRNONAA 6* 6* 5* 8*       Electronically Signed: Joaquim Nam, PA-C 07/19/2021, 2:01 PM  I spent a total of 15 minutes in face to face in clinical consultation, greater than 50% of which was counseling/coordinating care for venous access.

## 2021-07-19 NOTE — Care Management Important Message (Signed)
Important Message  Patient Details  Name: Whitney Walker MRN: [id number] Date of Birth: 12/21/1956   Medicare Important Message Given:  Yes     Orbie Pyo 07/19/2021, 4:01 PM

## 2021-07-20 ENCOUNTER — Encounter (HOSPITAL_COMMUNITY): Payer: Self-pay | Admitting: Student

## 2021-07-20 DIAGNOSIS — R7881 Bacteremia: Secondary | ICD-10-CM | POA: Diagnosis not present

## 2021-07-20 DIAGNOSIS — N186 End stage renal disease: Secondary | ICD-10-CM | POA: Diagnosis not present

## 2021-07-20 DIAGNOSIS — B9561 Methicillin susceptible Staphylococcus aureus infection as the cause of diseases classified elsewhere: Secondary | ICD-10-CM | POA: Diagnosis not present

## 2021-07-20 DIAGNOSIS — A419 Sepsis, unspecified organism: Secondary | ICD-10-CM | POA: Diagnosis not present

## 2021-07-20 LAB — CBC
HCT: 28.8 % — ABNORMAL LOW (ref 36.0–46.0)
Hemoglobin: 9.3 g/dL — ABNORMAL LOW (ref 12.0–15.0)
MCH: 29.4 pg (ref 26.0–34.0)
MCHC: 32.3 g/dL (ref 30.0–36.0)
MCV: 91.1 fL (ref 80.0–100.0)
Platelets: 160 10*3/uL (ref 150–400)
RBC: 3.16 MIL/uL — ABNORMAL LOW (ref 3.87–5.11)
RDW: 16.1 % — ABNORMAL HIGH (ref 11.5–15.5)
WBC: 13.2 10*3/uL — ABNORMAL HIGH (ref 4.0–10.5)
nRBC: 0 % (ref 0.0–0.2)

## 2021-07-20 LAB — BASIC METABOLIC PANEL
Anion gap: 13 (ref 5–15)
BUN: 34 mg/dL — ABNORMAL HIGH (ref 8–23)
CO2: 21 mmol/L — ABNORMAL LOW (ref 22–32)
Calcium: 6.9 mg/dL — ABNORMAL LOW (ref 8.9–10.3)
Chloride: 102 mmol/L (ref 98–111)
Creatinine, Ser: 8.09 mg/dL — ABNORMAL HIGH (ref 0.44–1.00)
GFR, Estimated: 5 mL/min — ABNORMAL LOW (ref >60)
Glucose, Bld: 121 mg/dL — ABNORMAL HIGH (ref 70–99)
Potassium: 3.8 mmol/L (ref 3.5–5.1)
Sodium: 136 mmol/L (ref 135–145)

## 2021-07-20 LAB — HEPARIN LEVEL (UNFRACTIONATED)
Heparin Unfractionated: <0.1 IU/mL — ABNORMAL LOW (ref 0.30–0.70)
Heparin Unfractionated: 0.43 IU/mL (ref 0.30–0.70)
Heparin Unfractionated: 0.63 IU/mL (ref 0.30–0.70)

## 2021-07-20 LAB — GLUCOSE, CAPILLARY
Glucose-Capillary: 126 mg/dL — ABNORMAL HIGH (ref 70–99)
Glucose-Capillary: 130 mg/dL — ABNORMAL HIGH (ref 70–99)

## 2021-07-20 MED ORDER — FERRIC CITRATE 1 GM 210 MG(FE) PO TABS
630.0000 mg | ORAL_TABLET | Freq: Three times a day (TID) | ORAL | Status: DC
  Administered 2021-07-20 – 2021-07-22 (×6): 630 mg via ORAL
  Filled 2021-07-20 (×5): qty 3

## 2021-07-20 MED ORDER — CEFAZOLIN SODIUM-DEXTROSE 1-4 GM/50ML-% IV SOLN
1.0000 g | INTRAVENOUS | Status: AC
  Administered 2021-07-21: 1 g via INTRAVENOUS
  Filled 2021-07-20: qty 50

## 2021-07-20 MED ORDER — MELATONIN 3 MG PO TABS
3.0000 mg | ORAL_TABLET | Freq: Every evening | ORAL | Status: DC | PRN
  Filled 2021-07-20: qty 1

## 2021-07-20 MED ORDER — FERRIC CITRATE 1 GM 210 MG(FE) PO TABS: 210.0000 mg | ORAL_TABLET | Freq: Two times a day (BID) | ORAL | Status: DC | PRN

## 2021-07-20 NOTE — Progress Notes (Addendum)
[facility] Progress Note   Subjective:  Seen in room. No CP or dyspnea. Still with some back pain, L wrist pain, L side pain (chronic). TDC removed yesterday - will ask IR to plan to place new tunneled cath tomorrow followed by dialysis.   Objective Vitals:   07/19/21 2151 07/19/21 2205 07/20/21 0541 07/20/21 0943  BP: (!) 129/59  129/60 (!) 124/93  Pulse: 87  82 91  Resp: 20  16 18   Temp: 98.1 F (36.7 C)  98 F (36.7 C) 98.2 F (36.8 C)  TempSrc: Oral  Oral Oral  SpO2: 94%  90% 91%  Weight:  95.5 kg    Height:       Physical Exam General: Well appearing woman, NAD. Room air. Heart: RRR; no murmur Lungs: CTAB Abdomen: soft, non-tender Extremities: No LE edema Dialysis Access: none at this time Additional Objective Labs: Basic Metabolic Panel: Recent Labs  Lab 07/17/21 0408 07/18/21 1430 07/18/21 2345 07/20/21 0149  NA 131* 133* 131* 136  K 4.9 4.5 3.5 3.8  CL 101 104 98 102  CO2 15* 14* 21* 21*  GLUCOSE 131* 113* 138* 121*  BUN 30* 50* 23 34*  CREATININE 7.20* 8.91* 5.80* 8.09*  CALCIUM 7.3* 7.0* 6.5* 6.9*  PHOS 6.5* 5.9*  --   --    Liver Function Tests: Recent Labs  Lab 07/16/21 1402 07/18/21 1430  AST 39  --   ALT 21  --   ALKPHOS 93  --   BILITOT 1.2  --   PROT 7.8  --   ALBUMIN 2.7* 2.0*    CBC: Recent Labs  Lab 07/16/21 1402 07/17/21 0408 07/18/21 0341 07/18/21 2345 07/20/21 0149  WBC 26.9* 29.1* 20.4* 14.3* 13.2*  NEUTROABS 23.8*  --   --   --   --   HGB 11.9* 10.7* 8.1* 9.8* 9.3*  HCT 37.8 34.8* 25.6* 30.0* 28.8*  MCV 92.2 94.3 92.4 89.8 91.1  PLT 143* 146* 159 129* 160   Blood Culture    Component Value Date/Time   SDES BLOOD RIGHT UPPER ARM 07/19/2021 1648   SPECREQUEST  07/19/2021 1648    BOTTLES DRAWN AEROBIC ONLY Blood Culture results may not be optimal due to an inadequate volume of blood received in culture bottles   CULT  07/19/2021 1648    NO GROWTH < 24 HOURS Performed at [facility],  [address], [place], [place] [address]    REPTSTATUS PENDING 07/19/2021 1648   Studies/Results: MR LUMBAR SPINE WO CONTRAST  Result Date: 07/18/2021 CLINICAL DATA:  Epidural abscess suspected. Additional provided: Bilateral flank pain, worsening for several weeks, dialysis patient, patient unable to walk due to pain. EXAM: MRI LUMBAR SPINE WITHOUT CONTRAST TECHNIQUE: Multiplanar, multisequence MR imaging of the lumbar spine was performed. No intravenous contrast was administered. COMPARISON:  Radiographs of the lumbar spine 05/08/2020. FINDINGS: Segmentation: 5 lumbar vertebrae. The caudal most well-formed intervertebral disc space is designated L5-S1. Alignment:  Trace T11-T12, L1-L2 and L3-L4 grade 1 retrolisthesis. Vertebrae: There are Schmorl nodes within the T12 inferior endplate, L1 inferior endplate, L3 superior endplate and L3 inferior endplate. There is minimal marrow edema surrounding the Schmorl node within the T12 inferior endplate, and this Schmorl node may be acute or subacute. Trace multilevel endplate edema elsewhere, which appear degenerative. Mild edema within the right L5 articular pillar also appears degenerative (series 3, image 5). No specific findings to suggest discitis/osteomyelitis. Conus medullaris and cauda equina: Conus extends to the L1-L2 level.  No signal abnormality within the visualized distal spinal cord. No epidural abscess is identified on this noncontrast examination. Paraspinal and other soft tissues: Atrophic kidneys. Atrophy of the lumbar paraspinal musculature. Additionally, there is nonspecific edema signal within the subcutaneous fat overlying the thoracolumbar spine bilaterally. Disc levels: Mild disc degeneration throughout the lumbar and visualized lower thoracic spine. T11-T12: Imaged sagittally. Trace grade 1 retrolisthesis. Small disc bulge. Mild partial effacement of the ventral thecal sac without spinal cord mass effect. No appreciable significant foraminal  stenosis. T12-L1: Imaged sagittally. Minimal disc bulge. No significant spinal canal or foraminal stenosis. L1-L2: Trace grade 1 retrolisthesis. No significant disc herniation or stenosis. L2-L3: Shallow left subarticular/foraminal disc protrusion. Minimal left subarticular narrowing without appreciable nerve root impingement. Central canal patent. No significant foraminal stenosis. L3-L4: Trace grade 1 retrolisthesis. Disc bulge. Superimposed broad-based central disc protrusion eccentric to the right. Mild facet arthrosis/ligamentum flavum hypertrophy. Trace right facet joint effusion. Mild bilateral subarticular and central canal narrowing without frank nerve root impingement. Mild relative bilateral neural foraminal narrowing. L4-L5: Disc bulge. Superimposed broad-based central disc protrusion at site of posterior annular fissure, eccentric to the right. Mild facet arthrosis/ligamentum flavum hypertrophy. Trace right facet joint effusion. The disc protrusion contributes to bilateral subarticular narrowing (mild/moderate right, mild left), contacting and crowding the descending right L5 nerve root (series 5, image 23). Mild relative narrowing of the central canal. Bilateral neural foraminal narrowing (mild/moderate right, mild left). L5-S1: Disc bulge with endplate spurring. Superimposed broad-based central disc protrusion at site of posterior annular fissure. Mild facet arthrosis. The disc protrusion contributes to bilateral subarticular stenosis (moderate right, mild left). The disc protrusion contacts and crowds the descending right S1 nerve root. The disc protrusion may also contact the descending left S1 nerve root. Mild relative narrowing of the central canal. No significant foraminal stenosis. IMPRESSION: No specific findings to suggest discitis/osteomyelitis at this time. No evidence of epidural abscess on this non-contrast study. Schmorl nodes within the T12 inferior endplate, L1 inferior endplate, L3  superior endplate and L3 inferior endplate. There is mild marrow edema surrounding the Schmorl node within the T12 inferior endplate, and this Schmorl node may be acute/subacute. Minimal edema elsewhere, as described and degenerative in appearance. Lumbar spondylosis, as outlined and with findings most notably as follows. At L4-L5, there is a broad-based central disc protrusion at site of posterior annular fissure. This contributes to multifactorial bilateral subarticular stenosis (mild/moderate right, mild left), contacting and crowding the descending right L5 nerve root. Mild central canal narrowing. Bilateral neural foraminal narrowing (mild/moderate right, mild left). Trace right facet joint effusion. At L5-S1, a broad-based central disc protrusion at site of posterior annular fissure contributes to multifactorial bilateral subarticular stenosis (moderate right, mild left). The disc protrusion contacts and crowds the descending right S1 nerve root. The disc protrusion may also contact the descending left S1 nerve root. Mild relative narrowing of the central canal. No more than mild spinal canal or neural foraminal narrowing at the remaining levels. Trace right facet joint effusion also present at L3-L4. Electronically Signed   By: Kellie Simmering DO   On: 07/18/2021 14:05   IR Removal Tun Cv Cath W/O FL  Result Date: 07/19/2021 INDICATION: Patient history of end-stage disease on hemodialysis via tunneled right HD catheter with MSSA bacteremia, request to IR for of tunneled HD catheter line holiday. EXAM: REMOVAL OF TUNNELED HEMODIALYSIS CATHETER MEDICATIONS: 7 mL 1% lidocaine 50 mcg fentanyl 1.5 mg midazolam COMPLICATIONS: None immediate. PROCEDURE: Informed written consent was obtained from  the patient following an explanation of the procedure, risks, benefits and alternatives to treatment. A time out was performed prior to the initiation of the procedure. Maximal barrier sterile technique was utilized  including caps, mask, sterile gowns, sterile gloves, large sterile drape, hand hygiene, and ChloraPrep. Heparin was removed from ports. There was no suture in place. 1% lidocaine was injected under sterile conditions along the subcutaneous tunnel. Utilizing a combination of blunt dissection and gentle traction, the catheter was removed intact. Hemostasis was obtained with manual compression. A dressing was placed. The patient tolerated the procedure well without immediate post procedural complication. IMPRESSION: Successful removal of tunneled dialysis catheter. Read by Candiss Norse, PA-C Electronically Signed   By: Ruthann Cancer MD   On: 07/19/2021 14:14    Medications:  sodium chloride 250 mL (07/19/21 0336)    ceFAZolin (ANCEF) IV 1 g (07/19/21 1654)   heparin 1,750 Units/hr (07/20/21 0326)    (feeding supplement) PROSource Plus  30 mL Oral BID BM   aspirin EC  81 mg Oral Daily   atorvastatin  10 mg Oral q1800   Chlorhexidine Gluconate Cloth  6 each Topical Q0600   darbepoetin (ARANESP) injection - DIALYSIS  40 mcg Intravenous Q Wed-HD   [START ON 07/23/2021] doxercalciferol  4 mcg Intravenous Q M,W,F-HD   ferric citrate  630 mg Oral TID WC   insulin aspart  0-6 Units Subcutaneous Q4H   pantoprazole  40 mg Oral Daily   sodium chloride flush  10-40 mL Intracatheter Q12H    Dialysis Orders: MWF at [facility] 3:30hr, 400/A1.5, EDW 90.5kg, 2K/2.5Ca, UFP #2, TDC, heparin 6000 - Hectoral 15mcg IV q HD - No ESA, Hgb > 11   Assessment/Plan:  Sepsis/MSSA bacteremia: Repeat Cx 7/19, 7/20 negative -> on Cefazolin. CXR clear. Currently off of pressors. LS MRI negative for diskitis (chronic DDD). Per ID, holding off on TEE given marked improvement. [place] removed 7/21 for line holiday  ESRD: Usual MWF schedule, dialyzed 7/20 and then [facility] removed 7/21 - plan to replace line tomorrow (7/23) followed by dialysis.  Hypotension/volume: Initially required pressors at this time, now off. Home BP meds  on hold. Still above EDW, will attempted standing weight for accuracy.  Anemia: Hgb 9.3, continue Aranesp q Wed. Metabolic bone disease: CorrCa and Phos ok, continue home binders/ VDRA.  Nutrition: Alb low, continue supplements.  Hx DVT/PE: Eliquis on hold, on hep drip for now    Veneta Penton, Hershal Coria 07/20/2021, 10:27 AM  [place]  I have seen and examined this patient and agree with plan and assessment in the above note with renal recommendations/intervention highlighted.  Broadus John A Rozell Theiler,MD 07/20/2021 2:15 PM

## 2021-07-20 NOTE — Evaluation (Signed)
Occupational Therapy Evaluation Patient Details Name: Whitney Walker MRN: [id number] DOB: 1956/10/18 Today's Date: 07/20/2021    History of Present Illness 65 year old woman presented to ED 07/16/21 with gradual onset of constant diffuse achy low back pain which had progressed over several weeks, followed by new onset of nausea, vomiting and diarrhea with fever 100. Found to be in septic shock admitted requiring titration of fluids and vasopressors. PMH: OSA; PVD; ESRD on MWF HD; DVT/PE; CAD; COPD; and colon cancer.   Clinical Impression   PTA, pt lived alone and received assistance with bathing and tub transfers. Currently, pt performs UB ADLs with set up, and LB ADLs with min Guard. Pt performing functional mobility with Min Guard a with RW. Pt with increased work of breathing with functional mobility in the hallway. Pt presents with decreased strength, balance, and activity tolerance. Recommend discharge home with [facility] and will follow acutely to optimize independence in ADLs.   SpO2 91% on RA after mobility. DOE 3/4    Follow Up Recommendations  Home health OT    Equipment Recommendations  None recommended by OT    Recommendations for Other Services       Precautions / Restrictions Precautions Precautions: None Restrictions Weight Bearing Restrictions: No      Mobility Bed Mobility               General bed mobility comments: Pt sitting eob on arrival.    Transfers Overall transfer level: Needs assistance Equipment used: Rolling walker (2 wheeled) Transfers: Sit to/from Stand Sit to Stand: Min guard         General transfer comment: Min guard for safety    Balance Overall balance assessment: Needs assistance Sitting-balance support: Feet supported;No upper extremity supported Sitting balance-Leahy Scale: Good     Standing balance support: Single extremity supported;Bilateral upper extremity supported;During functional activity;No upper extremity  supported Standing balance-Leahy Scale: Fair Standing balance comment: benefits from UE support                           ADL either performed or assessed with clinical judgement   ADL Overall ADL's : Needs assistance/impaired Eating/Feeding: Sitting;Set up   Grooming: Oral care;Min guard   Upper Body Bathing: Sitting;Supervision/ safety   Lower Body Bathing: Min guard;Sit to/from stand   Upper Body Dressing : Set up;Sitting   Lower Body Dressing: Min guard;Sit to/from stand   Toilet Transfer: Min guard;Ambulation;Regular Toilet   Toileting- Clothing Manipulation and Hygiene: Supervision/safety;Sitting/lateral lean       Functional mobility during ADLs: Min guard;Rolling walker General ADL Comments: Pt with increased WOB with functional mobility in hallway. Pt performing toilet transfer with Min Guard A and pericare with supervision while seated.     Vision Baseline Vision/History: Wears glasses Wears Glasses: Reading only Patient Visual Report: No change from baseline Vision Assessment?: No apparent visual deficits Additional Comments: reading labels on personal care items at sink without glasses     Perception     Praxis      Pertinent Vitals/Pain Pain Assessment: No/denies pain     Hand Dominance Right   Extremity/Trunk Assessment Upper Extremity Assessment Upper Extremity Assessment: LUE deficits/detail LUE Deficits / Details: "Recently took out IV. and is sore and swollen" LUE 3+/5 grip strength significantly less than RUE 4/5 grip strength.   Lower Extremity Assessment Lower Extremity Assessment: Defer to PT evaluation   Cervical / Trunk Assessment Cervical / Trunk Assessment: Other  exceptions Cervical / Trunk Exceptions: Increased body habitus   Communication Communication Communication: No difficulties   Cognition Arousal/Alertness: Awake/alert Behavior During Therapy: WFL for tasks assessed/performed Overall Cognitive Status: Within  Functional Limits for tasks assessed                                 General Comments: Pt conversational throughout session. Demonstrating good awareness of current deficits.   General Comments  SpO2 91% on RA after mobility. DOE 3/4    Exercises     Shoulder Instructions      Home Living Family/patient expects to be discharged to:: Private residence Living Arrangements: Alone   Type of Home: Apartment ("its made like a house, but its an apartment") Home Access: Stairs to enter [employer] of Steps: 1 step up in front Entrance Stairs-Rails: [place]: One level     Bathroom Shower/Tub: Corporate investment banker: Handicapped height Bathroom Accessibility: Yes   Home Equipment: Clinical cytogeneticist - 2 wheels;Walker - 4 wheels          Prior Functioning/Environment Level of Independence: Needs assistance  Gait / Transfers Assistance Needed: uses RW vs no AD depending on pain, a month ago was indepedent with mobility, ADL's / Homemaking Assistance Needed: a month ago independent with bADLs and iADLs   Comments: has transportation to HD        OT Problem List: Decreased strength;Decreased activity tolerance;Pain      OT Treatment/Interventions: Self-care/ADL training;Therapeutic exercise;Energy conservation;Therapeutic activities;Patient/family education;DME and/or AE instruction    OT Goals(Current goals can be found in the care plan section) Acute Rehab OT Goals Patient Stated Goal: independent ambulation OT Goal Formulation: With patient Time For Goal Achievement: 08/03/21 Potential to Achieve Goals: Good  OT Frequency: Min 2X/week   Barriers to D/C:            Co-evaluation              AM-PAC OT "6 Clicks" Daily Activity     Outcome Measure Help from another person eating meals?: A Little Help from another person taking care of personal grooming?: A Little Help from another person toileting,  which includes using toliet, bedpan, or urinal?: A Little Help from another person bathing (including washing, rinsing, drying)?: A Little Help from another person to put on and taking off regular upper body clothing?: A Little Help from another person to put on and taking off regular lower body clothing?: A Little 6 Click Score: 18   End of Session Equipment Utilized During Treatment: Gait belt;Rolling walker Nurse Communication: Mobility status  Activity Tolerance: Patient tolerated treatment well Patient left: in chair;with call bell/phone within reach;with chair alarm set  OT Visit Diagnosis: Unsteadiness on feet (R26.81);Muscle weakness (generalized) (M62.81);Pain Pain - part of body:  (lower back/flank area)                Time: 8115-7262 OT Time Calculation (min): 33 min Charges:  OT General Charges $OT Visit: 1 Visit OT Evaluation $OT Eval Low Complexity: 1 Low OT Treatments $Self Care/Home Management : 8-22 mins  Shanda Howells, OTDS   Shanda Howells 07/20/2021, 9:03 AM

## 2021-07-20 NOTE — Progress Notes (Signed)
ANTICOAGULATION CONSULT NOTE  Pharmacy Consult for IV Heparin Indication: Hx of DVT  Patient Measurements: Height: 5\' 1"  (154.9 cm) Weight: 95.5 kg (210 lb 9.6 oz) IBW/kg (Calculated) : 47.8 Heparin Dosing Weight: 69 kg   Vital Signs: Temp: 98.2 F (36.8 C) (07/22 0943) Temp Source: Oral (07/22 0943) BP: 124/93 (07/22 0943) Pulse Rate: 91 (07/22 0943)  Labs: Recent Labs    07/17/21 1414 07/17/21 1414 07/18/21 0341 07/18/21 1430 07/18/21 1431 07/18/21 2345 07/20/21 0149 07/20/21 1046  HGB  --    < > 8.1*  --   --  9.8* 9.3*  --   HCT  --   --  25.6*  --   --  30.0* 28.8*  --   PLT  --   --  159  --   --  129* 160  --   APTT 45*  --   --   --   --   --   --   --   HEPARINUNFRC 0.16*  --  0.19*  --    < > 0.49 <0.10* 0.43  CREATININE  --   --   --  8.91*  --  5.80* 8.09*  --    < > = values in this interval not displayed.     Estimated Creatinine Clearance: 7.2 mL/min (A) (by C-G formula based on SCr of 8.09 mg/dL (H)).  Medications:  Apixaban 2.5 mg BID PTA  Assessment: 65 yr old woman with ESRD (on MWF HD) presented to ED critically ill due to MSSA bacteremia and septic shock. Patient has hx of VTE (05/01/21) and was on apixaban 2.5 mg PO BID PTA (last dose unknown, but within past week per patient). Heparin level and PTT correlating.  Heparin held 7/21 for HD cath removal. Restarted at previously therapeutic rate and Heparin level resulted undetectable (at 1600 units/hr), heparin increased to 1750 units/hr. No issues with line or bleeding reported. Heparin level therapeutic now at 0.43.    Goal of Therapy:  Heparin level 0.3 -0.7 units/ml Monitor platelets by anticoagulation protocol: Yes   Plan:  Continue heparin infusion at 1750 units/hr Check anti-Xa level in 8 hours and daily while on heparin Continue to monitor H&H and platelets    Thank you for allowing [place] to participate in this patients care. Jens Som, PharmD 07/20/2021 12:41 PM  Please  check [email or web address] for unit-specific pharmacy phone numbers.

## 2021-07-20 NOTE — Progress Notes (Signed)
[place] for IV Heparin Indication: Hx of DVT  Patient Measurements: Height: 5\' 1"  (154.9 cm) Weight: 95.5 kg (210 lb 9.6 oz) IBW/kg (Calculated) : 47.8 Heparin Dosing Weight: 69 kg   Vital Signs: Temp: 98.2 F (36.8 C) (07/22 0943) Temp Source: Oral (07/22 0943) BP: 124/93 (07/22 0943) Pulse Rate: 91 (07/22 0943)  Labs: Recent Labs    07/18/21 0341 07/18/21 1430 07/18/21 1431 07/18/21 2345 07/20/21 0149 07/20/21 1046 07/20/21 1932  HGB 8.1*  --   --  9.8* 9.3*  --   --   HCT 25.6*  --   --  30.0* 28.8*  --   --   PLT 159  --   --  129* 160  --   --   HEPARINUNFRC 0.19*  --    < > 0.49 <0.10* 0.43 0.63  CREATININE  --  8.91*  --  5.80* 8.09*  --   --    < > = values in this interval not displayed.     Estimated Creatinine Clearance: 7.2 mL/min (A) (by C-G formula based on SCr of 8.09 mg/dL (H)).  Medications:  Apixaban 2.5 mg BID PTA  Assessment: 65 yr old woman with ESRD (on MWF HD) presented to ED critically ill due to MSSA bacteremia and septic shock. Patient has hx of VTE (05/01/21) and was on apixaban 2.5 mg PO BID PTA (last dose unknown, but within past week per patient). Heparin level and PTT correlating.  Heparin level this evening is therapeutic but trending up (HL 0.63 << 0.43, goal of 0.3-0.7). No bleeding or issues noted per RN.   Goal of Therapy:  Heparin level 0.3 -0.7 units/ml Monitor platelets by anticoagulation protocol: Yes   Plan:  - Reduce Heparin slightly to 1700 units/hr (17 ml/hr) to keep within range - Will continue to monitor for any signs/symptoms of bleeding and will follow up with heparin level in the a.m.   Thank you for allowing pharmacy to be a part of this patient's care.  Alycia Rossetti, PharmD, BCPS Clinical Pharmacist Clinical phone for 07/20/2021: [phone] 07/20/2021 8:05 PM   **Pharmacist phone directory can now be found on [email or web address] ([email or web address]).  Listed under [place].

## 2021-07-20 NOTE — Progress Notes (Signed)
Pharmacy Antibiotic Note  Whitney Walker is a 65 y.o. female admitted on 07/16/2021 with bacteremia.  Pharmacy has been consulted for Ancef dosing. Patient with ESRD receiving HD MWF prior to admission. Patient presented after a full HD session on Monday, 7/18 for worsening back pain, fever, and hypotension. Blood cultures were positive for MSSA and the HD cath was removed on 7/21 for a 48 hour holiday for potential source control.   Plan: Continue cefazolin 1 g IV every 24 hours. Follow-up on HD plan.  Height: 5\' 1"  (154.9 cm) Weight: 95.5 kg (210 lb 9.6 oz) IBW/kg (Calculated) : 47.8  Temp (24hrs), Avg:98.1 F (36.7 C), Min:98 F (36.7 C), Max:98.2 F (36.8 C)  Recent Labs  Lab 07/16/21 1402 07/16/21 1510 07/17/21 0408 07/18/21 0341 07/18/21 1430 07/18/21 2345 07/20/21 0149  WBC 26.9*  --  29.1* 20.4*  --  14.3* 13.2*  CREATININE 7.31*  --  7.20*  --  8.91* 5.80* 8.09*  LATICACIDVEN 3.3* 1.7  --   --   --   --   --     Estimated Creatinine Clearance: 7.2 mL/min (A) (by C-G formula based on SCr of 8.09 mg/dL (H)).    Allergies  Allergen Reactions   Carnosine     Other reaction(s): Unknown   Gadolinium Derivatives Hives and Other (See Comments)    HIVES, Desc: HIVES W/ "DYE" USED FOR 1ST CT SCAN BUT NOT 2ND, NO PREMEDS USED, PT UNCERTAIN OF CIRCUMSTANCES,,?POSSIBLE MRI CONTRAST ALLERGY, ALL STUDIES DONE "SOMEWHERE" IN [place]//A.C., Onset Date: [id number]   Iohexol Other (See Comments)     Code: HIVES, Desc: HIVES W/ "DYE" USED FOR 1ST CT SCAN BUT NOT 2ND, NO PREMEDS USED, PT UNCERTAIN OF CIRCUMSTANCES,,?POSSIBLE MRI CONTRAST ALLERGY, ALL STUDIES DONE "SOMEWHERE" IN [place]//A.C., Onset Date: [id number]    Iodine Hives   Naltrexone     Other reaction(s): Unknown    Antimicrobials this admission: Vanc 7/18>>7/19 Cefepime 7/18>>7/19 Ancef 7/19 >> Flagyl x 1 7/18  Dose adjustments this admission: No dose adjustments at this time.  Microbiology  results: 7/21 BCx >> Staph aureus 7/20 BCx >> NGTD ~24hr 7/19 BCx >> NGTD ~48hr 7/18 BCx >> 2/4 (BCID MSSA) 7/18 Cdiff negative 7/18 GI panel negative 7/18 UCx >> sent   Thank you for allowing pharmacy to be a part of this patient's care.  Varney Daily, PharmD PGY1 Pharmacy Resident  Please check AMION for all [facility] pharmacy phone numbers After 10:00 PM call main pharmacy [phone]

## 2021-07-20 NOTE — Progress Notes (Signed)
[facility]  Date of Admission:  07/16/2021     CC: Mssa bacteremia  Lines: Chronic right chest tunneled hd catheter removed 7/21 Nonfunctioning LUE native AVF (no recent access for the past year)  Abx: 7/19-c cefazolin  ASSESSMENT: #mssa bacteremia  #esrd #lumbar back pain Duration of sx 2 weeks/community onset likely hd catheter beign source. Nonfunctioning native avf clinically doesn't appear infected 7/18 bcx positive 7/19 and 7/20 bcx ngtd 7/19 tte no veg 7/20 mri of lumbar spine showed no evidence pyogenic focus -- however given bacteremia and no other obvious cause back pain that is new, will plan on 6 weeks tx duration LLE rash onset late 04/2019 doesn't appear to be staph aureus bsi related -- stable no change  Patient waiting for the new HD catheter placement tomorrow Repeat bcx so far ngtd including one just after old HD catheter removal on 7/21 Back pain improving No other sign of metastatic involvement from mssa bsi    -if blood cx 7/21 negative tomorrow, reasonable to place new HD catheter -continue cefazolin 1 gram qhs while in the hospital; give after dialysis on HD days -on discharge can give cefazolin three times weekly with dialysis, 2 gram/2 gram/3 gram (the 3 gram dosing is on the last dialysis day of the week) -duration plan 6 weeks from 7/21 given concern for back pain/mssa involvement not yet seen on MRI -id follow up as below -discussed with primary team     OPAT Orders Discharge antibiotics to be given via HD line, to be given at dialysis center    Labs weekly while on IV antibiotics: _x_ CBC with differential __ BMP _x_ CMP _x_ CRP __ ESR __ Vancomycin trough __ CK   Fax weekly labs to [phone]  Clinic Follow Up Appt: Dr Shaynna Husby 8/10 @ 345  @  [facility] [address], [place], [place] [address] Phone: [phone]     I spent more than 35 minute reviewing data/chart, and  coordinating care and >50% direct face to face time providing counseling/discussing diagnostics/treatment plan with patient       Active Problems:   ESRD (end stage renal disease) ([place])   Septic shock ([place])   MSSA bacteremia   Acute low back pain   Allergies  Allergen Reactions   Carnosine     Other reaction(s): Unknown   Gadolinium Derivatives Hives and Other (See Comments)    HIVES, Desc: HIVES W/ "DYE" USED FOR 1ST CT SCAN BUT NOT 2ND, NO PREMEDS USED, PT UNCERTAIN OF CIRCUMSTANCES,,?POSSIBLE MRI CONTRAST ALLERGY, ALL STUDIES DONE "SOMEWHERE" IN [place]//A.C., Onset Date: [id number]   Iohexol Other (See Comments)     Code: HIVES, Desc: HIVES W/ "DYE" USED FOR 1ST CT SCAN BUT NOT 2ND, NO PREMEDS USED, PT UNCERTAIN OF CIRCUMSTANCES,,?POSSIBLE MRI CONTRAST ALLERGY, ALL STUDIES DONE "SOMEWHERE" IN [place]//A.C., Onset Date: [id number]    Iodine Hives   Naltrexone     Other reaction(s): Unknown    Scheduled Meds:  (feeding supplement) PROSource Plus  30 mL Oral BID BM   aspirin EC  81 mg Oral Daily   atorvastatin  10 mg Oral q1800   Chlorhexidine Gluconate Cloth  6 each Topical Q0600   darbepoetin (ARANESP) injection - DIALYSIS  40 mcg Intravenous Q Wed-HD   [START ON 07/23/2021] doxercalciferol  4 mcg Intravenous Q M,W,F-HD   ferric citrate  630 mg Oral TID WC   insulin aspart  0-6 Units  Subcutaneous Q4H   pantoprazole  40 mg Oral Daily   sodium chloride flush  10-40 mL Intracatheter Q12H   Continuous Infusions:  sodium chloride 250 mL (07/19/21 0336)    ceFAZolin (ANCEF) IV 1 g (07/19/21 1654)   heparin 1,750 Units/hr (07/20/21 0326)   PRN Meds:.acetaminophen, albuterol, docusate sodium, ferric citrate, lidocaine, polyethylene glycol, sodium chloride flush   SUBJECTIVE: Back pain improving, controlled with tylenol only Getting out of bed/pt-ot no issues Afebrile No n/v/diarrhea Old HD catheter removed 7/21; repeat bcx done post removal ngtd  Repeat bcx from  7/19 & 7/20 negative  Review of Systems: ROS All other ROS was negative, except mentioned above     OBJECTIVE: Vitals:   07/19/21 2151 07/19/21 2205 07/20/21 0541 07/20/21 0943  BP: (!) 129/59  129/60 (!) 124/93  Pulse: 87  82 91  Resp: 20  16 18   Temp: 98.1 F (36.7 C)  98 F (36.7 C) 98.2 F (36.8 C)  TempSrc: Oral  Oral Oral  SpO2: 94%  90% 91%  Weight:  95.5 kg    Height:       Body mass index is 39.79 kg/m.  Physical Exam  General/constitutional: no distress, pleasant HEENT: Normocephalic, PER, Conj Clear, EOMI, Oropharynx clear Neck supple CV: rrr no mrg Lungs: clear to auscultation, normal respiratory effort Abd: Soft, Nontender Ext: no edema Skin: No Rash Neuro: nonfocal MSK: lumbar spine nontender   Central line presence: previous right chest hd cath site nontender/nonerythematous  Lab Results Lab Results  Component Value Date   WBC 13.2 (H) 07/20/2021   HGB 9.3 (L) 07/20/2021   HCT 28.8 (L) 07/20/2021   MCV 91.1 07/20/2021   PLT 160 07/20/2021    Lab Results  Component Value Date   CREATININE 8.09 (H) 07/20/2021   BUN 34 (H) 07/20/2021   NA 136 07/20/2021   K 3.8 07/20/2021   CL 102 07/20/2021   CO2 21 (L) 07/20/2021    Lab Results  Component Value Date   ALT 21 07/16/2021   AST 39 07/16/2021   ALKPHOS 93 07/16/2021   BILITOT 1.2 07/16/2021      Microbiology: Recent Results (from the past 240 hour(s))  Blood Culture (routine x 2)     Status: None (Preliminary result)   Collection Time: 07/16/21  1:15 PM   Specimen: BLOOD  Result Value Ref Range Status   Specimen Description BLOOD BLOOD RIGHT FOREARM  Final   Special Requests   Final    BOTTLES DRAWN AEROBIC AND ANAEROBIC Blood Culture adequate volume   Culture   Final    NO GROWTH 4 DAYS Performed at [facility], [address], [place], [place] [address]    Report Status PENDING  Incomplete  Resp Panel by RT-PCR (Flu A&B, Covid) Nasopharyngeal Swab     Status: None    Collection Time: 07/16/21  1:17 PM   Specimen: Nasopharyngeal Swab; Nasopharyngeal(NP) swabs in vial transport medium  Result Value Ref Range Status   SARS Coronavirus 2 by RT PCR NEGATIVE NEGATIVE Final    Comment: (NOTE) SARS-CoV-2 target nucleic acids are NOT DETECTED.  The SARS-CoV-2 RNA is generally detectable in upper respiratory specimens during the acute phase of infection. The lowest concentration of SARS-CoV-2 viral copies this assay can detect is 138 copies/mL. A negative result does not preclude SARS-Cov-2 infection and should not be used as the sole basis for treatment or other patient management decisions. A negative result may occur with  improper specimen  collection/handling, submission of specimen other than nasopharyngeal swab, presence of viral mutation(s) within the areas targeted by this assay, and inadequate number of viral copies(<138 copies/mL). A negative result must be combined with clinical observations, patient history, and epidemiological information. The expected result is Negative.  Fact Sheet for Patients:  EntrepreneurPulse.com.au  Fact Sheet for Healthcare Providers:  IncredibleEmployment.be  This test is no t yet approved or cleared by the [place] FDA and  has been authorized for detection and/or diagnosis of SARS-CoV-2 by FDA under an Emergency Use Authorization (EUA). This EUA will remain  in effect (meaning this test can be used) for the duration of the COVID-19 declaration under Section 564(b)(1) of the Act, 21 U.S.C.section 360bbb-3(b)(1), unless the authorization is terminated  or revoked sooner.       Influenza A by PCR NEGATIVE NEGATIVE Final   Influenza B by PCR NEGATIVE NEGATIVE Final    Comment: (NOTE) The Xpert Xpress SARS-CoV-2/FLU/RSV plus assay is intended as an aid in the diagnosis of influenza from Nasopharyngeal swab specimens and should not be used as a sole basis for treatment.  Nasal washings and aspirates are unacceptable for Xpert Xpress SARS-CoV-2/FLU/RSV testing.  Fact Sheet for Patients: EntrepreneurPulse.com.au  Fact Sheet for Healthcare Providers: IncredibleEmployment.be  This test is not yet approved or cleared by the [place] FDA and has been authorized for detection and/or diagnosis of SARS-CoV-2 by FDA under an Emergency Use Authorization (EUA). This EUA will remain in effect (meaning this test can be used) for the duration of the COVID-19 declaration under Section 564(b)(1) of the Act, 21 U.S.C. section 360bbb-3(b)(1), unless the authorization is terminated or revoked.  Performed at [facility], [place] [address], [place], [place] [address]   Blood Culture (routine x 2)     Status: Abnormal   Collection Time: 07/16/21  2:04 PM   Specimen: BLOOD RIGHT WRIST  Result Value Ref Range Status   Specimen Description BLOOD RIGHT WRIST  Final   Special Requests   Final    BOTTLES DRAWN AEROBIC AND ANAEROBIC Blood Culture results may not be optimal due to an inadequate volume of blood received in culture bottles   Culture  Setup Time   Final    GRAM POSITIVE COCCI IN CLUSTERS IN BOTH AEROBIC AND ANAEROBIC BOTTLES CRITICAL RESULT CALLED TO, READ BACK BY AND VERIFIED WITHEather Colas PHARMD, AT 2876 07/17/21 [place] Performed at [facility], [place] [address], [place], [place] [address]    Culture STAPHYLOCOCCUS AUREUS (A)  Final   Report Status 07/19/2021 FINAL  Final   Organism ID, Bacteria STAPHYLOCOCCUS AUREUS  Final      Susceptibility   Staphylococcus aureus - MIC*    CIPROFLOXACIN <=0.5 SENSITIVE Sensitive     ERYTHROMYCIN <=0.25 SENSITIVE Sensitive     GENTAMICIN <=0.5 SENSITIVE Sensitive     OXACILLIN <=0.25 SENSITIVE Sensitive     TETRACYCLINE <=1 SENSITIVE Sensitive     VANCOMYCIN <=0.5 SENSITIVE Sensitive     TRIMETH/SULFA 160 RESISTANT Resistant     CLINDAMYCIN <=0.25 SENSITIVE  Sensitive     RIFAMPIN <=0.5 SENSITIVE Sensitive     Inducible Clindamycin NEGATIVE Sensitive     * STAPHYLOCOCCUS AUREUS  Blood Culture ID Panel (Reflexed)     Status: Abnormal   Collection Time: 07/16/21  2:04 PM  Result Value Ref Range Status   Enterococcus faecalis NOT DETECTED NOT DETECTED Final   Enterococcus Faecium NOT DETECTED NOT DETECTED Final   Listeria monocytogenes NOT  DETECTED NOT DETECTED Final   Staphylococcus species DETECTED (A) NOT DETECTED Final    Comment: CRITICAL RESULT CALLED TO, READ BACK BY AND VERIFIED WITH: C. AMEND PHARMD, AT 0450 07/17/21 D. VANHOOK    Staphylococcus aureus (BCID) DETECTED (A) NOT DETECTED Final    Comment: CRITICAL RESULT CALLED TO, READ BACK BY AND VERIFIED WITH: C. AMEND PHARMD, AT 3704 07/17/21 D. VANHOOK    Staphylococcus epidermidis NOT DETECTED NOT DETECTED Final   Staphylococcus lugdunensis NOT DETECTED NOT DETECTED Final   Streptococcus species NOT DETECTED NOT DETECTED Final   Streptococcus agalactiae NOT DETECTED NOT DETECTED Final   Streptococcus pneumoniae NOT DETECTED NOT DETECTED Final   Streptococcus pyogenes NOT DETECTED NOT DETECTED Final   A.calcoaceticus-baumannii NOT DETECTED NOT DETECTED Final   Bacteroides fragilis NOT DETECTED NOT DETECTED Final   Enterobacterales NOT DETECTED NOT DETECTED Final   Enterobacter cloacae complex NOT DETECTED NOT DETECTED Final   Escherichia coli NOT DETECTED NOT DETECTED Final   Klebsiella aerogenes NOT DETECTED NOT DETECTED Final   Klebsiella oxytoca NOT DETECTED NOT DETECTED Final   Klebsiella pneumoniae NOT DETECTED NOT DETECTED Final   Proteus species NOT DETECTED NOT DETECTED Final   Salmonella species NOT DETECTED NOT DETECTED Final   Serratia marcescens NOT DETECTED NOT DETECTED Final   Haemophilus influenzae NOT DETECTED NOT DETECTED Final   Neisseria meningitidis NOT DETECTED NOT DETECTED Final   Pseudomonas aeruginosa NOT DETECTED NOT DETECTED Final   Stenotrophomonas  maltophilia NOT DETECTED NOT DETECTED Final   Candida albicans NOT DETECTED NOT DETECTED Final   Candida auris NOT DETECTED NOT DETECTED Final   Candida glabrata NOT DETECTED NOT DETECTED Final   Candida krusei NOT DETECTED NOT DETECTED Final   Candida parapsilosis NOT DETECTED NOT DETECTED Final   Candida tropicalis NOT DETECTED NOT DETECTED Final   Cryptococcus neoformans/gattii NOT DETECTED NOT DETECTED Final   Meth resistant mecA/C and MREJ NOT DETECTED NOT DETECTED Final    Comment: Performed at [facility], [address] [place], [place] [address]  Gastrointestinal Panel by PCR , Stool     Status: None   Collection Time: 07/16/21  3:08 PM   Specimen: STOOL  Result Value Ref Range Status   Campylobacter species NOT DETECTED NOT DETECTED Final   Plesimonas shigelloides NOT DETECTED NOT DETECTED Final   Salmonella species NOT DETECTED NOT DETECTED Final   Yersinia enterocolitica NOT DETECTED NOT DETECTED Final   Vibrio species NOT DETECTED NOT DETECTED Final   Vibrio cholerae NOT DETECTED NOT DETECTED Final   Enteroaggregative E coli (EAEC) NOT DETECTED NOT DETECTED Final   Enteropathogenic E coli (EPEC) NOT DETECTED NOT DETECTED Final   Enterotoxigenic E coli (ETEC) NOT DETECTED NOT DETECTED Final   Shiga like toxin producing E coli (STEC) NOT DETECTED NOT DETECTED Final   Shigella/Enteroinvasive E coli (EIEC) NOT DETECTED NOT DETECTED Final   Cryptosporidium NOT DETECTED NOT DETECTED Final   Cyclospora cayetanensis NOT DETECTED NOT DETECTED Final   Entamoeba histolytica NOT DETECTED NOT DETECTED Final   Giardia lamblia NOT DETECTED NOT DETECTED Final   Adenovirus F40/41 NOT DETECTED NOT DETECTED Final   Astrovirus NOT DETECTED NOT DETECTED Final   Norovirus GI/GII NOT DETECTED NOT DETECTED Final   Rotavirus A NOT DETECTED NOT DETECTED Final   Sapovirus (I, II, IV, and V) NOT DETECTED NOT DETECTED Final    Comment: Performed at [facility], [address] [place], [place] [address]  C Difficile Quick Screen w PCR  reflex     Status: None   Collection Time: 07/16/21  3:08 PM   Specimen: STOOL  Result Value Ref Range Status   C Diff antigen NEGATIVE NEGATIVE Final   C Diff toxin NEGATIVE NEGATIVE Final   C Diff interpretation No C. difficile detected.  Final    Comment: Performed at [facility], [place] [address], [place], [place] [address]  Culture, blood (Routine X 2) w Reflex to ID Panel     Status: None (Preliminary result)   Collection Time: 07/17/21  8:45 AM   Specimen: BLOOD RIGHT ARM  Result Value Ref Range Status   Specimen Description BLOOD RIGHT ARM  Final   Special Requests   Final    BOTTLES DRAWN AEROBIC AND ANAEROBIC Blood Culture adequate volume   Culture   Final    NO GROWTH 3 DAYS Performed at [facility], [place] [address] [place], [place] [address]    Report Status PENDING  Incomplete  Culture, blood (Routine X 2) w Reflex to ID Panel     Status: None (Preliminary result)   Collection Time: 07/17/21  8:55 AM   Specimen: BLOOD LEFT HAND  Result Value Ref Range Status   Specimen Description BLOOD LEFT HAND  Final   Special Requests   Final    BOTTLES DRAWN AEROBIC AND ANAEROBIC Blood Culture adequate volume   Culture   Final    NO GROWTH 3 DAYS Performed at [facility], [place] [address] [place], [place] [address]    Report Status PENDING  Incomplete  Culture, blood (routine x 2)     Status: None (Preliminary result)   Collection Time: 07/18/21  3:41 AM   Specimen: BLOOD RIGHT HAND  Result Value Ref Range Status   Specimen Description BLOOD RIGHT HAND  Final   Special Requests   Final    BOTTLES DRAWN AEROBIC ONLY Blood Culture adequate volume   Culture   Final    NO GROWTH 2 DAYS Performed at [facility], [place] [address], [place], [place] [address]    Report Status PENDING  Incomplete  Culture, blood (routine x 2)     Status: None (Preliminary result)   Collection Time: 07/18/21  3:41 AM    Specimen: BLOOD RIGHT HAND  Result Value Ref Range Status   Specimen Description BLOOD RIGHT HAND  Final   Special Requests   Final    BOTTLES DRAWN AEROBIC ONLY Blood Culture adequate volume   Culture   Final    NO GROWTH 2 DAYS Performed at [facility], [place] [address] [place], [place] [address]    Report Status PENDING  Incomplete  Culture, blood (routine x 2)     Status: None (Preliminary result)   Collection Time: 07/19/21  4:35 PM   Specimen: BLOOD  Result Value Ref Range Status   Specimen Description BLOOD RIGHT ANTECUBITAL  Final   Special Requests   Final    BOTTLES DRAWN AEROBIC ONLY Blood Culture results may not be optimal due to an inadequate volume of blood received in culture bottles   Culture   Final    NO GROWTH < 24 HOURS Performed at [facility], [place] [address] [place], [place] [address]    Report Status PENDING  Incomplete  Culture, blood (routine x 2)     Status: None (Preliminary result)   Collection Time: 07/19/21  4:48 PM   Specimen: BLOOD  Result Value Ref  Range Status   Specimen Description BLOOD RIGHT UPPER ARM  Final   Special Requests   Final    BOTTLES DRAWN AEROBIC ONLY Blood Culture results may not be optimal due to an inadequate volume of blood received in culture bottles   Culture   Final    NO GROWTH < 24 HOURS Performed at [facility] [address], [place], [place] [address]    Report Status PENDING  Incomplete     Serology:   Imaging: If present, new imagings (plain films, ct scans, and mri) have been personally visualized and interpreted; radiology reports have been reviewed. Decision making incorporated into the Impression / Recommendations.  7/19 tte No vegetation  7/20 mri lspine No specific findings to suggest discitis/osteomyelitis at this time. No evidence of epidural abscess on this non-contrast study.   Schmorl nodes within the T12 inferior endplate, L1 inferior endplate, L3 superior endplate  and L3 inferior endplate. There is mild marrow edema surrounding the Schmorl node within the T12 inferior endplate, and this Schmorl node may be acute/subacute. Minimal edema elsewhere, as described and degenerative in appearance.   Lumbar spondylosis, as outlined and with findings most notably as follows.   At L4-L5, there is a broad-based central disc protrusion at site of posterior annular fissure. This contributes to multifactorial bilateral subarticular stenosis (mild/moderate right, mild left), contacting and crowding the descending right L5 nerve root. Mild central canal narrowing. Bilateral neural foraminal narrowing (mild/moderate right, mild left). Trace right facet joint effusion.   At L5-S1, a broad-based central disc protrusion at site of posterior annular fissure contributes to multifactorial bilateral subarticular stenosis (moderate right, mild left). The disc protrusion contacts and crowds the descending right S1 nerve root. The disc protrusion may also contact the descending left S1 nerve root. Mild relative narrowing of the central canal.   No more than mild spinal canal or neural foraminal narrowing at the remaining levels.   Trace right facet joint effusion also present at L3-L4.  [name], [facility] [place] [phone] pager    07/20/2021, 12:14 PM

## 2021-07-20 NOTE — Progress Notes (Addendum)
PROGRESS NOTE    Whitney Walker  XKG:[id number] DOB: 1956/08/02 DOA: 07/16/2021 PCP: [name], FNP   Brief Narrative:  76 yof w/ past medical hx as provided below; presented to the ED 7/18 w/ cc  gradual onset of constant diffuse achy low back pain which had progressed over several weeks, followed by new onset of nausea, vomiting and diarrhea with fever 100. Hypotensive in the ED - admitted to [facility] given need for pressors. Weaned off pressors 7/19 - improving on vanc and cefepime. Found the have bactermia (MSSA) with source thought to be HD catheter - pulled 7/21 - pending repeat cultures will replace line for HD.  Assessment & Plan:   Active Problems:   ESRD (end stage renal disease) (Whitney Walker)   Septic shock (Whitney Walker)   MSSA bacteremia   Acute low back pain  new hd line tomorrow hopefully 7/21 bcx remains negative. cefazolin 6 weeks with outpatient dialysis  Septic Shock secondary to MSSA bacteremia, POA Secondary to Whitney Walker for dialysis - MSSA bacteremia in 2/4  blood cultures on 7/18, no growth on repeat culture from 7/19. MRI lumbar spine without signs of abscess osteo. TTE without vegetations - ID consulted, appreciate assistance -plan for cefazolin x6 weeks with dialysis on dialysis days, 2 g, 2 g, 3 g per ID recommendations -Tunneled catheter removed 07/19/2021, 48-hour line holiday per ID recommendations, tentative plan for replaced dialysis catheter on the 23rd - TTE reassuring - no need for TEE per ID discussion   ESRD on HD MWF - completed full session Mon 7/18. - Nephrology consulted, appreciate assistance - HD today - catheter holiday given MSSA bacteremia after HD today - Will likely need new access placed 7/22 or 7/23   Hx DVT / PE. - Heparin ongoing - can likely resume eliquis 2.5 in the next 24-48h pending line removal/replacments.   HTN, HLD, PVD, CAD  - LHC from May 2022 with mod disease RCA and Cx, diffuse coronary calcifications, minimal disease LAD, normal LVEF  and LVEDP - Continue atorvastatin  - Hold home Amlodipine, Carvedilol.   RUL and LLL pulmonary nodules - stable. - F/u CT in 12 - 18 months.   Renal cyst - incidentally noted on CT - US findings consistent with simple right  renal cysts  DVT prophylaxis: heparin Code Status: Full Family Communication: None present  Status is: inpt  Dispo: The patient is from: Home              Anticipated d/c is to: TBD              Anticipated d/c date is: 48+h              Patient currently NOT medically stable for discharge  Consultants:  Nephro, PCCM, ID, IR  Antimicrobials:  Cefazolin - ongoing   Subjective: No acute issues/events overnight, denies nausea vomiting diarrhea constipation headache fevers chills chest pain shortness of breath.  Objective: Vitals:   07/19/21 1754 07/19/21 2151 07/19/21 2205 07/20/21 0541  BP: (!) 114/53 (!) 129/59  129/60  Pulse: 86 87  82  Resp: 18 20  16   Temp: 98.1 F (36.7 C) 98.1 F (36.7 C)  98 F (36.7 C)  TempSrc: Oral Oral  Oral  SpO2: 92% 94%  90%  Weight:   95.5 kg   Height:        Intake/Output Summary (Last 24 hours) at 07/20/2021 7026 Last data filed at 07/19/2021 2200 Gross per 24 hour  Intake 440 ml  Output 0 ml  Net 440 ml    Filed Weights   07/18/21 1345 07/18/21 1707 07/19/21 2205  Weight: 93.7 kg 92 kg 95.5 kg    Examination:  General exam: Appears calm and comfortable  Respiratory system: Clear to auscultation. Respiratory effort normal. Cardiovascular system: S1 & S2 heard, RRR. R IJ HD cath in place Gastrointestinal system: Abdomen is nondistended, soft and nontender. No organomegaly or masses felt. Normal bowel sounds heard. Central nervous system: Alert and oriented. No focal neurological deficits. Extremities: Symmetric 5 x 5 power. Skin: No rashes, lesions or ulcers Psychiatry: Judgement and insight appear normal. Mood & affect appropriate.   Data Reviewed: I have personally reviewed following labs and  imaging studies  CBC: Recent Labs  Lab 07/16/21 1402 07/17/21 0408 07/18/21 0341 07/18/21 2345 07/20/21 0149  WBC 26.9* 29.1* 20.4* 14.3* 13.2*  NEUTROABS 23.8*  --   --   --   --   HGB 11.9* 10.7* 8.1* 9.8* 9.3*  HCT 37.8 34.8* 25.6* 30.0* 28.8*  MCV 92.2 94.3 92.4 89.8 91.1  PLT 143* 146* 159 129* 893    Basic Metabolic Panel: Recent Labs  Lab 07/16/21 1402 07/17/21 0408 07/18/21 1430 07/18/21 2345 07/20/21 0149  NA 132* 131* 133* 131* 136  K 4.8 4.9 4.5 3.5 3.8  CL 94* 101 104 98 102  CO2 19* 15* 14* 21* 21*  GLUCOSE 99 131* 113* 138* 121*  BUN 24* 30* 50* 23 34*  CREATININE 7.31* 7.20* 8.91* 5.80* 8.09*  CALCIUM 8.2* 7.3* 7.0* 6.5* 6.9*  MG  --  1.7  --   --   --   PHOS  --  6.5* 5.9*  --   --     GFR: Estimated Creatinine Clearance: 7.2 mL/min (A) (by C-G formula based on SCr of 8.09 mg/dL (H)). Liver Function Tests: Recent Labs  Lab 07/16/21 1402 07/18/21 1430  AST 39  --   ALT 21  --   ALKPHOS 93  --   BILITOT 1.2  --   PROT 7.8  --   ALBUMIN 2.7* 2.0*    No results for input(s): LIPASE, AMYLASE in the last 168 hours. No results for input(s): AMMONIA in the last 168 hours. Coagulation Profile: Recent Labs  Lab 07/16/21 1402  INR 1.3*    Cardiac Enzymes: No results for input(s): CKTOTAL, CKMB, CKMBINDEX, TROPONINI in the last 168 hours. BNP (last 3 results) No results for input(s): PROBNP in the last 8760 hours. HbA1C: No results for input(s): HGBA1C in the last 72 hours.  CBG: Recent Labs  Lab 07/19/21 1135 07/19/21 1621 07/19/21 2017 07/20/21 0047 07/20/21 0416  GLUCAP 95 120* 150* 130* 126*    Lipid Profile: No results for input(s): CHOL, HDL, LDLCALC, TRIG, CHOLHDL, LDLDIRECT in the last 72 hours. Thyroid Function Tests: No results for input(s): TSH, T4TOTAL, FREET4, T3FREE, THYROIDAB in the last 72 hours. Anemia Panel: No results for input(s): VITAMINB12, FOLATE, FERRITIN, TIBC, IRON, RETICCTPCT in the last 72  hours. Sepsis Labs: Recent Labs  Lab 07/16/21 1402 07/16/21 1510  LATICACIDVEN 3.3* 1.7     Recent Results (from the past 240 hour(s))  Blood Culture (routine x 2)     Status: None (Preliminary result)   Collection Time: 07/16/21  1:15 PM   Specimen: BLOOD  Result Value Ref Range Status   Specimen Description BLOOD BLOOD RIGHT FOREARM  Final   Special Requests   Final    BOTTLES DRAWN AEROBIC AND ANAEROBIC Blood Culture adequate  volume   Culture   Final    NO GROWTH 4 DAYS Performed at [facility], [place] [address] [place], [place] [address]    Report Status PENDING  Incomplete  Resp Panel by RT-PCR (Flu A&B, Covid) Nasopharyngeal Swab     Status: None   Collection Time: 07/16/21  1:17 PM   Specimen: Nasopharyngeal Swab; Nasopharyngeal(NP) swabs in vial transport medium  Result Value Ref Range Status   SARS Coronavirus 2 by RT PCR NEGATIVE NEGATIVE Final    Comment: (NOTE) SARS-CoV-2 target nucleic acids are NOT DETECTED.  The SARS-CoV-2 RNA is generally detectable in upper respiratory specimens during the acute phase of infection. The lowest concentration of SARS-CoV-2 viral copies this assay can detect is 138 copies/mL. A negative result does not preclude SARS-Cov-2 infection and should not be used as the sole basis for treatment or other patient management decisions. A negative result may occur with  improper specimen collection/handling, submission of specimen other than nasopharyngeal swab, presence of viral mutation(s) within the areas targeted by this assay, and inadequate number of viral copies(<138 copies/mL). A negative result must be combined with clinical observations, patient history, and epidemiological information. The expected result is Negative.  Fact Sheet for Patients:  EntrepreneurPulse.com.au  Fact Sheet for Healthcare Providers:  IncredibleEmployment.be  This test is no t yet approved or cleared by the  [place] FDA and  has been authorized for detection and/or diagnosis of SARS-CoV-2 by FDA under an Emergency Use Authorization (EUA). This EUA will remain  in effect (meaning this test can be used) for the duration of the COVID-19 declaration under Section 564(b)(1) of the Act, 21 U.S.C.section 360bbb-3(b)(1), unless the authorization is terminated  or revoked sooner.       Influenza A by PCR NEGATIVE NEGATIVE Final   Influenza B by PCR NEGATIVE NEGATIVE Final    Comment: (NOTE) The Xpert Xpress SARS-CoV-2/FLU/RSV plus assay is intended as an aid in the diagnosis of influenza from Nasopharyngeal swab specimens and should not be used as a sole basis for treatment. Nasal washings and aspirates are unacceptable for Xpert Xpress SARS-CoV-2/FLU/RSV testing.  Fact Sheet for Patients: EntrepreneurPulse.com.au  Fact Sheet for Healthcare Providers: IncredibleEmployment.be  This test is not yet approved or cleared by the [place] FDA and has been authorized for detection and/or diagnosis of SARS-CoV-2 by FDA under an Emergency Use Authorization (EUA). This EUA will remain in effect (meaning this test can be used) for the duration of the COVID-19 declaration under Section 564(b)(1) of the Act, 21 U.S.C. section 360bbb-3(b)(1), unless the authorization is terminated or revoked.  Performed at [facility], [place] [address] [place], [place] [address]   Blood Culture (routine x 2)     Status: Abnormal   Collection Time: 07/16/21  2:04 PM   Specimen: BLOOD RIGHT WRIST  Result Value Ref Range Status   Specimen Description BLOOD RIGHT WRIST  Final   Special Requests   Final    BOTTLES DRAWN AEROBIC AND ANAEROBIC Blood Culture results may not be optimal due to an inadequate volume of blood received in culture bottles   Culture  Setup Time   Final    GRAM POSITIVE COCCI IN CLUSTERS IN BOTH AEROBIC AND ANAEROBIC BOTTLES CRITICAL RESULT CALLED  TO, READ BACK BY AND VERIFIED WITHEather Colas PHARMD, AT 8295 07/17/21 [place] Performed at [facility], [place] [address], [place], [place] [address]    Culture STAPHYLOCOCCUS AUREUS (A)  Final  Report Status 07/19/2021 FINAL  Final   Organism ID, Bacteria STAPHYLOCOCCUS AUREUS  Final      Susceptibility   Staphylococcus aureus - MIC*    CIPROFLOXACIN <=0.5 SENSITIVE Sensitive     ERYTHROMYCIN <=0.25 SENSITIVE Sensitive     GENTAMICIN <=0.5 SENSITIVE Sensitive     OXACILLIN <=0.25 SENSITIVE Sensitive     TETRACYCLINE <=1 SENSITIVE Sensitive     VANCOMYCIN <=0.5 SENSITIVE Sensitive     TRIMETH/SULFA 160 RESISTANT Resistant     CLINDAMYCIN <=0.25 SENSITIVE Sensitive     RIFAMPIN <=0.5 SENSITIVE Sensitive     Inducible Clindamycin NEGATIVE Sensitive     * STAPHYLOCOCCUS AUREUS  Blood Culture ID Panel (Reflexed)     Status: Abnormal   Collection Time: 07/16/21  2:04 PM  Result Value Ref Range Status   Enterococcus faecalis NOT DETECTED NOT DETECTED Final   Enterococcus Faecium NOT DETECTED NOT DETECTED Final   Listeria monocytogenes NOT DETECTED NOT DETECTED Final   Staphylococcus species DETECTED (A) NOT DETECTED Final    Comment: CRITICAL RESULT CALLED TO, READ BACK BY AND VERIFIED WITH: C. AMEND PHARMD, AT 0450 07/17/21 D. VANHOOK    Staphylococcus aureus (BCID) DETECTED (A) NOT DETECTED Final    Comment: CRITICAL RESULT CALLED TO, READ BACK BY AND VERIFIED WITH: C. AMEND PHARMD, AT 0923 07/17/21 D. VANHOOK    Staphylococcus epidermidis NOT DETECTED NOT DETECTED Final   Staphylococcus lugdunensis NOT DETECTED NOT DETECTED Final   Streptococcus species NOT DETECTED NOT DETECTED Final   Streptococcus agalactiae NOT DETECTED NOT DETECTED Final   Streptococcus pneumoniae NOT DETECTED NOT DETECTED Final   Streptococcus pyogenes NOT DETECTED NOT DETECTED Final   A.calcoaceticus-baumannii NOT DETECTED NOT DETECTED Final   Bacteroides fragilis NOT DETECTED NOT DETECTED Final    Enterobacterales NOT DETECTED NOT DETECTED Final   Enterobacter cloacae complex NOT DETECTED NOT DETECTED Final   Escherichia coli NOT DETECTED NOT DETECTED Final   Klebsiella aerogenes NOT DETECTED NOT DETECTED Final   Klebsiella oxytoca NOT DETECTED NOT DETECTED Final   Klebsiella pneumoniae NOT DETECTED NOT DETECTED Final   Proteus species NOT DETECTED NOT DETECTED Final   Salmonella species NOT DETECTED NOT DETECTED Final   Serratia marcescens NOT DETECTED NOT DETECTED Final   Haemophilus influenzae NOT DETECTED NOT DETECTED Final   Neisseria meningitidis NOT DETECTED NOT DETECTED Final   Pseudomonas aeruginosa NOT DETECTED NOT DETECTED Final   Stenotrophomonas maltophilia NOT DETECTED NOT DETECTED Final   Candida albicans NOT DETECTED NOT DETECTED Final   Candida auris NOT DETECTED NOT DETECTED Final   Candida glabrata NOT DETECTED NOT DETECTED Final   Candida krusei NOT DETECTED NOT DETECTED Final   Candida parapsilosis NOT DETECTED NOT DETECTED Final   Candida tropicalis NOT DETECTED NOT DETECTED Final   Cryptococcus neoformans/gattii NOT DETECTED NOT DETECTED Final   Meth resistant mecA/C and MREJ NOT DETECTED NOT DETECTED Final    Comment: Performed at [facility], [address], [place], [place] [address]  Gastrointestinal Panel by PCR , Stool     Status: None   Collection Time: 07/16/21  3:08 PM   Specimen: STOOL  Result Value Ref Range Status   Campylobacter species NOT DETECTED NOT DETECTED Final   Plesimonas shigelloides NOT DETECTED NOT DETECTED Final   Salmonella species NOT DETECTED NOT DETECTED Final   Yersinia enterocolitica NOT DETECTED NOT DETECTED Final   Vibrio species NOT DETECTED NOT DETECTED Final   Vibrio cholerae NOT DETECTED NOT DETECTED Final   Enteroaggregative  E coli (EAEC) NOT DETECTED NOT DETECTED Final   Enteropathogenic E coli (EPEC) NOT DETECTED NOT DETECTED Final   Enterotoxigenic E coli (ETEC) NOT DETECTED NOT DETECTED Final   Shiga  like toxin producing E coli (STEC) NOT DETECTED NOT DETECTED Final   Shigella/Enteroinvasive E coli (EIEC) NOT DETECTED NOT DETECTED Final   Cryptosporidium NOT DETECTED NOT DETECTED Final   Cyclospora cayetanensis NOT DETECTED NOT DETECTED Final   Entamoeba histolytica NOT DETECTED NOT DETECTED Final   Giardia lamblia NOT DETECTED NOT DETECTED Final   Adenovirus F40/41 NOT DETECTED NOT DETECTED Final   Astrovirus NOT DETECTED NOT DETECTED Final   Norovirus GI/GII NOT DETECTED NOT DETECTED Final   Rotavirus A NOT DETECTED NOT DETECTED Final   Sapovirus (I, II, IV, and V) NOT DETECTED NOT DETECTED Final    Comment: Performed at [facility], [address], [place], [place] [address]  C Difficile Quick Screen w PCR reflex     Status: None   Collection Time: 07/16/21  3:08 PM   Specimen: STOOL  Result Value Ref Range Status   C Diff antigen NEGATIVE NEGATIVE Final   C Diff toxin NEGATIVE NEGATIVE Final   C Diff interpretation No C. difficile detected.  Final    Comment: Performed at [facility], [place] [address] [place], [place] [address]  Culture, blood (Routine X 2) w Reflex to ID Panel     Status: None (Preliminary result)   Collection Time: 07/17/21  8:45 AM   Specimen: BLOOD RIGHT ARM  Result Value Ref Range Status   Specimen Description BLOOD RIGHT ARM  Final   Special Requests   Final    BOTTLES DRAWN AEROBIC AND ANAEROBIC Blood Culture adequate volume   Culture   Final    NO GROWTH 3 DAYS Performed at [facility], [place] [address] [place], [place] [address]    Report Status PENDING  Incomplete  Culture, blood (Routine X 2) w Reflex to ID Panel     Status: None (Preliminary result)   Collection Time: 07/17/21  8:55 AM   Specimen: BLOOD LEFT HAND  Result Value Ref Range Status   Specimen Description BLOOD LEFT HAND  Final   Special Requests   Final    BOTTLES DRAWN AEROBIC AND ANAEROBIC Blood Culture adequate volume   Culture   Final    NO GROWTH 3  DAYS Performed at [facility], [place] [address] [place], [place] [address]    Report Status PENDING  Incomplete  Culture, blood (routine x 2)     Status: None (Preliminary result)   Collection Time: 07/18/21  3:41 AM   Specimen: BLOOD RIGHT HAND  Result Value Ref Range Status   Specimen Description BLOOD RIGHT HAND  Final   Special Requests   Final    BOTTLES DRAWN AEROBIC ONLY Blood Culture adequate volume   Culture   Final    NO GROWTH 2 DAYS Performed at [facility], [place] [address], [place],  [address]    Report Status PENDING  Incomplete  Culture, blood (routine x 2)     Status: None (Preliminary result)   Collection Time: 07/18/21  3:41 AM   Specimen: BLOOD RIGHT HAND  Result Value Ref Range Status   Specimen Description BLOOD RIGHT HAND  Final   Special Requests   Final    BOTTLES DRAWN AEROBIC ONLY Blood Culture adequate volume   Culture   Final    NO  GROWTH 2 DAYS Performed at [facility], [place] [address], [place], [place] [address]    Report Status PENDING  Incomplete  Culture, blood (routine x 2)     Status: None (Preliminary result)   Collection Time: 07/19/21  4:35 PM   Specimen: BLOOD  Result Value Ref Range Status   Specimen Description BLOOD RIGHT ANTECUBITAL  Final   Special Requests   Final    BOTTLES DRAWN AEROBIC ONLY Blood Culture results may not be optimal due to an inadequate volume of blood received in culture bottles   Culture   Final    NO GROWTH < 24 HOURS Performed at [facility], [place] [address], [place], [place] [address]    Report Status PENDING  Incomplete  Culture, blood (routine x 2)     Status: None (Preliminary result)   Collection Time: 07/19/21  4:48 PM   Specimen: BLOOD  Result Value Ref Range Status   Specimen Description BLOOD RIGHT UPPER ARM  Final   Special Requests   Final    BOTTLES DRAWN AEROBIC ONLY Blood Culture results may not be optimal due to an inadequate volume of blood received in  culture bottles   Culture   Final    NO GROWTH < 24 HOURS Performed at [facility], [place] [address], [place], [place] [address]    Report Status PENDING  Incomplete          Radiology Studies: MR LUMBAR SPINE WO CONTRAST  Result Date: 07/18/2021 CLINICAL DATA:  Epidural abscess suspected. Additional provided: Bilateral flank pain, worsening for several weeks, dialysis patient, patient unable to walk due to pain. EXAM: MRI LUMBAR SPINE WITHOUT CONTRAST TECHNIQUE: Multiplanar, multisequence MR imaging of the lumbar spine was performed. No intravenous contrast was administered. COMPARISON:  Radiographs of the lumbar spine 05/08/2020. FINDINGS: Segmentation: 5 lumbar vertebrae. The caudal most well-formed intervertebral disc space is designated L5-S1. Alignment:  Trace T11-T12, L1-L2 and L3-L4 grade 1 retrolisthesis. Vertebrae: There are Schmorl nodes within the T12 inferior endplate, L1 inferior endplate, L3 superior endplate and L3 inferior endplate. There is minimal marrow edema surrounding the Schmorl node within the T12 inferior endplate, and this Schmorl node may be acute or subacute. Trace multilevel endplate edema elsewhere, which appear degenerative. Mild edema within the right L5 articular pillar also appears degenerative (series 3, image 5). No specific findings to suggest discitis/osteomyelitis. Conus medullaris and cauda equina: Conus extends to the L1-L2 level. No signal abnormality within the visualized distal spinal cord. No epidural abscess is identified on this noncontrast examination. Paraspinal and other soft tissues: Atrophic kidneys. Atrophy of the lumbar paraspinal musculature. Additionally, there is nonspecific edema signal within the subcutaneous fat overlying the thoracolumbar spine bilaterally. Disc levels: Mild disc degeneration throughout the lumbar and visualized lower thoracic spine. T11-T12: Imaged sagittally. Trace grade 1 retrolisthesis. Small disc bulge. Mild  partial effacement of the ventral thecal sac without spinal cord mass effect. No appreciable significant foraminal stenosis. T12-L1: Imaged sagittally. Minimal disc bulge. No significant spinal canal or foraminal stenosis. L1-L2: Trace grade 1 retrolisthesis. No significant disc herniation or stenosis. L2-L3: Shallow left subarticular/foraminal disc protrusion. Minimal left subarticular narrowing without appreciable nerve root impingement. Central canal patent. No significant foraminal stenosis. L3-L4: Trace grade 1 retrolisthesis. Disc bulge. Superimposed broad-based central disc protrusion eccentric to the right. Mild facet arthrosis/ligamentum flavum hypertrophy. Trace right facet joint effusion. Mild bilateral subarticular and central canal narrowing without frank nerve root impingement. Mild relative bilateral neural  foraminal narrowing. L4-L5: Disc bulge. Superimposed broad-based central disc protrusion at site of posterior annular fissure, eccentric to the right. Mild facet arthrosis/ligamentum flavum hypertrophy. Trace right facet joint effusion. The disc protrusion contributes to bilateral subarticular narrowing (mild/moderate right, mild left), contacting and crowding the descending right L5 nerve root (series 5, image 23). Mild relative narrowing of the central canal. Bilateral neural foraminal narrowing (mild/moderate right, mild left). L5-S1: Disc bulge with endplate spurring. Superimposed broad-based central disc protrusion at site of posterior annular fissure. Mild facet arthrosis. The disc protrusion contributes to bilateral subarticular stenosis (moderate right, mild left). The disc protrusion contacts and crowds the descending right S1 nerve root. The disc protrusion may also contact the descending left S1 nerve root. Mild relative narrowing of the central canal. No significant foraminal stenosis. IMPRESSION: No specific findings to suggest discitis/osteomyelitis at this time. No evidence of  epidural abscess on this non-contrast study. Schmorl nodes within the T12 inferior endplate, L1 inferior endplate, L3 superior endplate and L3 inferior endplate. There is mild marrow edema surrounding the Schmorl node within the T12 inferior endplate, and this Schmorl node may be acute/subacute. Minimal edema elsewhere, as described and degenerative in appearance. Lumbar spondylosis, as outlined and with findings most notably as follows. At L4-L5, there is a broad-based central disc protrusion at site of posterior annular fissure. This contributes to multifactorial bilateral subarticular stenosis (mild/moderate right, mild left), contacting and crowding the descending right L5 nerve root. Mild central canal narrowing. Bilateral neural foraminal narrowing (mild/moderate right, mild left). Trace right facet joint effusion. At L5-S1, a broad-based central disc protrusion at site of posterior annular fissure contributes to multifactorial bilateral subarticular stenosis (moderate right, mild left). The disc protrusion contacts and crowds the descending right S1 nerve root. The disc protrusion may also contact the descending left S1 nerve root. Mild relative narrowing of the central canal. No more than mild spinal canal or neural foraminal narrowing at the remaining levels. Trace right facet joint effusion also present at L3-L4. Electronically Signed   By: Kellie Simmering DO   On: 07/18/2021 14:05   IR Removal Tun Cv Cath W/O FL  Result Date: 07/19/2021 INDICATION: Patient history of end-stage disease on hemodialysis via tunneled right HD catheter with MSSA bacteremia, request to IR for of tunneled HD catheter line holiday. EXAM: REMOVAL OF TUNNELED HEMODIALYSIS CATHETER MEDICATIONS: 7 mL 1% lidocaine 50 mcg fentanyl 1.5 mg midazolam COMPLICATIONS: None immediate. PROCEDURE: Informed written consent was obtained from the patient following an explanation of the procedure, risks, benefits and alternatives to treatment. A  time out was performed prior to the initiation of the procedure. Maximal barrier sterile technique was utilized including caps, mask, sterile gowns, sterile gloves, large sterile drape, hand hygiene, and ChloraPrep. Heparin was removed from ports. There was no suture in place. 1% lidocaine was injected under sterile conditions along the subcutaneous tunnel. Utilizing a combination of blunt dissection and gentle traction, the catheter was removed intact. Hemostasis was obtained with manual compression. A dressing was placed. The patient tolerated the procedure well without immediate post procedural complication. IMPRESSION: Successful removal of tunneled dialysis catheter. Read by Candiss Norse, PA-C Electronically Signed   By: Ruthann Cancer MD   On: 07/19/2021 14:14        Scheduled Meds:  (feeding supplement) PROSource Plus  30 mL Oral BID BM   aspirin EC  81 mg Oral Daily   atorvastatin  10 mg Oral q1800   Chlorhexidine Gluconate Cloth  6 each Topical  Q0600   darbepoetin (ARANESP) injection - DIALYSIS  40 mcg Intravenous Q Wed-HD   [START ON 07/23/2021] doxercalciferol  4 mcg Intravenous Q M,W,F-HD   insulin aspart  0-6 Units Subcutaneous Q4H   lanthanum  1,000 mg Oral TID WC   pantoprazole  40 mg Oral Daily   sodium chloride flush  10-40 mL Intracatheter Q12H   Continuous Infusions:  sodium chloride 250 mL (07/19/21 0336)    ceFAZolin (ANCEF) IV 1 g (07/19/21 1654)   heparin 1,750 Units/hr (07/20/21 0326)     LOS: 4 days   Time spent: 35 min  Little Ishikawa, DO [facility]  If 7PM-7AM, please contact night-coverage [email or web address]  07/20/2021, 8:07 AM

## 2021-07-20 NOTE — Progress Notes (Signed)
Reserve for IV Heparin Indication: Hx of DVT  Patient Measurements: Height: 5\' 1"  (154.9 cm) Weight: 95.5 kg (210 lb 9.6 oz) IBW/kg (Calculated) : 47.8 Heparin Dosing Weight: 69 kg   Vital Signs: Temp: 98.1 F (36.7 C) (07/21 2151) Temp Source: Oral (07/21 2151) BP: 129/59 (07/21 2151) Pulse Rate: 87 (07/21 2151)  Labs: Recent Labs    07/17/21 0408 07/17/21 3570 07/17/21 1414 07/18/21 0341 07/18/21 1430 07/18/21 1431 07/18/21 2345 07/20/21 0149  HGB 10.7*  --   --  8.1*  --   --  9.8* 9.3*  HCT 34.8*  --   --  25.6*  --   --  30.0* 28.8*  PLT 146*  --   --  159  --   --  129* 160  APTT 41*  --  45*  --   --   --   --   --   HEPARINUNFRC 0.16*  --  0.16* 0.19*  --  0.40 0.49 <0.10*  CREATININE 7.20*  --   --   --  8.91*  --  5.80*  --   TROPONINIHS 83* 87*  --   --   --   --   --   --      Estimated Creatinine Clearance: 10.1 mL/min (A) (by C-G formula based on SCr of 5.8 mg/dL (H)).  Medications:  Apixaban 2.5 mg BID PTA  Assessment: 65 yr old woman with ESRD (on MWF HD) presented to ED critically ill due to MSSA bacteremia and septic shock. Patient has hx of VTE (05/01/21) and was on apixaban 2.5 mg PO BID PTA (last dose unknown, but within past week per patient). Heparin level and PTT correlating.  Heparin held for a few hours 7/21 for HD cath removal. Restarted at previously therapeutic rate 7/21 1400. Heparin level now undetectable on gtt at 1600 units/hr - unusual since pt therapetic on this dose prior to heparin being held. No issues with line or bleeding reported per RN.  Goal of Therapy:  Heparin level 0.3 -0.7 units/ml Monitor platelets by anticoagulation protocol: Yes   Plan:  Increase heparin infusion to 1750 units/hr F/u 8hr heparin level  Sherlon Handing, PharmD, BCPS Please see amion for complete clinical pharmacist phone list 07/20/2021 2:44 AM

## 2021-07-20 NOTE — H&P (Addendum)
Chief Complaint: Patient was seen in consultation today for image guided tunneled HD catheter placement  Chief Complaint  Patient presents with   Flank Pain   at the request of Stovall, K. PA-C  Referring Physician(s): Stovall, K. PA-C  Supervising Physician: Aletta Edouard  Patient Status: [facility] - In-pt  History of Present Illness: Whitney Walker is a 65 y.o. femalewell known to IR for previous tunneled HD cathete placement and removal.  She is currently hospitalized due to MSSA bacteremia,she underwent tunneled HD catheter removal yesterday.   IR was requested for tunneled HD catheter placement tomorrow, 07/21/21, by nephrology.   Patient laying in bed, not in acute distress.  States dizzy spells with nausea due to vertigo.  Denise headache, fever, chills, shortness of breath, cough, chest pain, abdominal pain, nausea ,vomiting, and bleeding.   Past Medical History:  Diagnosis Date   Anemia    Anxiety    CHF (congestive heart failure) ([place])    Colon cancer ([place])    treatment surgery   Complication of anesthesia    after first C- [place] "couldnt walk after", patient denies having a spinal   COPD (chronic obstructive pulmonary disease) ([place])    Coronary artery disease    Depression    DVT (deep venous thrombosis) (HCC)    ESRD (end stage renal disease) ([place])    Hemo: MWF   History of blood transfusion 04/2018   Hypertension    07/07/18- no longer takes BP medications   Meningitis    Pain in limb 07/30/2013   PE (pulmonary embolism)    Peripheral vascular disease (HCC)    Restless legs    Shortness of breath    with exertion   Sleep apnea    SOB (shortness of breath) 03/03/2019   Vertigo     Past Surgical History:  Procedure Laterality Date   ABDOMINAL AORTAGRAM N/A 07/26/2013   Procedure: ABDOMINAL Maxcine Ham;  Surgeon: [name] [place], MD;  Location: [facility] CATH LAB;  Service: Cardiovascular;  Laterality: N/A;   ABDOMINAL HYSTERECTOMY     AV FISTULA PLACEMENT  Left 05/05/2018   Procedure: ARTERIOVENOUS (AV) FISTULA CREATION BRACHIOCEPHALIC;  Surgeon: Waynetta Sandy, MD;  Location: [place];  Service: Vascular;  Laterality: Left;   AV FISTULA PLACEMENT Left 07/16/2018   Procedure: ARTERIOVENOUS FISTULA CREATION;  Surgeon: Waynetta Sandy, MD;  Location: [place];  Service: Vascular;  Laterality: Left;   Dante Left 09/03/2018   Procedure: BASILIC VEIN TRANSPOSITION SECOND STAGE;  Surgeon: Waynetta Sandy, MD;  Location: [place];  Service: Vascular;  Laterality: Left;   BREAST BIOPSY Left    CESAREAN SECTION     X 3 1974-1977   CHOLECYSTECTOMY     CHOLECYSTECTOMY  1980/s   COLECTOMY  2010   DIVERTICULOSIS SURGERY-2002  2012   FEMORAL-POPLITEAL BYPASS GRAFT Left 08/13/2013   Procedure: BYPASS GRAFT FEMORAL-POPLITEAL ARTERY WITH NON-REVERSED SAPHANEOUS VEIN; ULTRASOUND GUIDED;  Surgeon: Mal [name], MD;  Location: [place];  Service: Vascular;  Laterality: Left;   INSERTION OF DIALYSIS CATHETER Right 04/30/2018   Procedure: INSERTION OF Right Internal Jugular DIALYSIS CATHETER;  Surgeon: [name], MD;  Location: [facility] OR;  Service: Vascular;  Laterality: Right;   IR FLUORO GUIDE CV LINE RIGHT  07/24/2020   IR REMOVAL TUN CV CATH W/O FL  07/21/2020   IR REMOVAL TUN CV CATH W/O FL  07/19/2021   IR US GUIDE VASC ACCESS RIGHT  07/24/2020   LEFT HEART CATH  AND CORONARY ANGIOGRAPHY N/A 05/01/2021   Procedure: LEFT HEART CATH AND CORONARY ANGIOGRAPHY;  Surgeon: [name], MD;  [place] [facility] [facility];  Service: Cardiovascular;  Laterality: N/A;   LOWER EXTREMITY ANGIOGRAM Left 07/26/2013   Procedure: LOWER EXTREMITY ANGIOGRAM;  Surgeon: [name] Godley, MD;  [facility] C[facility] LAB;  Service: Cardiovascular;  Laterality: Left;    Allergies: Carnosine, Gadolinium derivatives, Iohexol, Iodine, and Naltrexone  Medications: Prior to Admission medications   Medication Sig Start Date End Date Taking? Authorizing Provider   acetaminophen (TYLENOL) 500 MG tablet Take 1,000 mg by mouth 2 (two) times daily as needed for moderate pain or headache.   Yes [provider]  albuterol (VENTOLIN HFA) 108 (90 Base) MCG/ACT inhaler Inhale 2 puffs into the lungs every 6 (six) hours as needed for wheezing or shortness of breath. 08/03/20  Yes [name], FNP  apixaban (ELIQUIS) 2.5 MG TABS tablet Take 1 tablet (2.5 mg total) by mouth 2 (two) times daily. 06/13/21  Yes Truitt Merle, MD  Ascorbic Acid (VITAMIN C) 100 MG tablet Take 100 mg by mouth daily.   Yes [provider]  atorvastatin (LIPITOR) 10 MG tablet Take 1 tablet (10 mg total) by mouth daily. 06/05/21  Yes Cantwell, Celeste C, PA-C  AURYXIA 1 GM 210 MG(Fe) tablet Take 420-840 mg by mouth with breakfast, with lunch, and with evening meal. Taking  2 tablets daily (420mg ) with each snack 05/15/21  Yes [provider]  Biotin 10000 MCG TABS Take 10,000 mcg by mouth daily.    Yes [provider]  carvedilol (COREG) 12.5 MG tablet Take 12.5 mg by mouth 2 (two) times daily with a meal.    Yes [provider]  meclizine (ANTIVERT) 12.5 MG tablet Take 1 tablet (12.5 mg total) by mouth 3 (three) times daily as needed for dizziness. 04/12/21  Yes Glendale Chard, MD  multivitamin (RENA-VIT) TABS tablet Take 1 tablet by mouth at bedtime.   Yes Edrick Oh, MD  mupirocin ointment (BACTROBAN) 2 % Apply 1 application topically daily as needed (skin bumps). 04/15/19  Yes [name], FNP  nystatin (NYSTATIN) powder Apply 1 application topically 4 (four) times daily as needed. Patient taking differently: Apply 1 application topically 4 (four) times daily as needed (irriation). 08/03/20  Yes [name], FNP  omeprazole (PRILOSEC) 20 MG capsule Take 1 capsule (20 mg total) by mouth daily. 05/17/21  Yes Truitt Merle, MD  vitamin E 200 UNIT capsule Take 200 Units by mouth daily.   Yes [provider]  amLODipine (NORVASC) 5 MG tablet Take 1 tablet  (5 mg total) by mouth daily. Patient not taking: No sig reported 06/07/21 06/07/22  [name], FNP  predniSONE (DELTASONE) 10 MG tablet Take 4 tablets (40 mg) daily for 6 days, then, Take 3 tablets (30 mg) daily for 6 days, then, Take 2 tablets (20 mg) daily for 6 days, then, Take 1 tablets (10 mg) daily for 2 days, then stop Patient not taking: No sig reported 05/31/21   Jonetta Osgood, MD     Family History  Problem Relation Age of Onset   Cancer Mother 70       breast and bone   Cancer Father 15       prostate   Hypertension Sister    Bleeding Disorder Sister    Cancer Cousin 20       breast cancer    Hypertension Daughter    Breast cancer Neg Hx  Social History   Socioeconomic History   Marital status: Single    Spouse name: Not on file   Number of children: 4   Years of education: 12th gd   Highest education level: Not on file  Occupational History   Occupation: N/A  Tobacco Use   Smoking status: Every Day    Packs/day: 0.50    Years: 50.00    Pack years: 25.00    Types: Cigarettes    Last attempt to quit: 02/03/2021    Years since quitting: 0.4   Smokeless tobacco: Former  Scientific laboratory technician Use: Never used  Substance and Sexual Activity   Alcohol use: No    Alcohol/week: 0.0 standard drinks    Comment: she used to drink alcohol, quit in 2010; h/o heavy use    Drug use: Yes    Types: Oxycodone   Sexual activity: Not Currently  Other Topics Concern   Not on file  Social History Narrative   2 cups of coffee a day    Social Determinants of Health   Financial Resource Strain: Low Risk    Difficulty of Paying Living Expenses: Not hard at all  Food Insecurity: No Food Insecurity   Worried About Charity fundraiser in the Last Year: Never true   [place] in the Last Year: Never true  Transportation Needs: No Transportation Needs   Lack of Transportation (Medical): No   Lack of Transportation (Non-Medical): No  Physical Activity: Inactive    Days of Exercise per Week: 0 days   Minutes of Exercise per Session: 0 min  Stress: No Stress Concern Present   Feeling of Stress : Not at all  Social Connections: Not on file     Review of Systems: A 12 point ROS discussed and pertinent positives are indicated in the HPI above.  All other systems are negative.   Vital Signs: BP (!) 124/93 (BP Location: Left Arm)   Pulse 91   Temp 98.2 F (36.8 C) (Oral)   Resp 18   Ht 5\' 1"  (1.549 m)   Wt 210 lb 9.6 oz (95.5 kg)   SpO2 91%   BMI 39.79 kg/m   Physical Exam  Vitals and nursing note reviewed.  Constitutional:      General: He is not in acute distress.    Appearance: Normal appearance.  HENT:     Head: Normocephalic and atraumatic.     Mouth/Throat:     Mouth: Mucous membranes are moist.     Pharynx: Oropharynx is clear.  Cardiovascular:     Rate and Rhythm: Normal rate and regular rhythm.     Pulses: Normal pulses.     Heart sounds: Normal heart sounds.  Pulmonary:     Effort: Pulmonary effort is normal.     Breath sounds: Normal breath sounds. No wheezing, rhonchi or rales.  Abdominal:     General: Bowel sounds are normal. There is no distension.     Palpations: Abdomen is soft.  Skin:    General: Skin is warm and dry.  Neurological:     Mental Status: He is alert and oriented to person, place, and time.  Psychiatric:        Mood and Affect: Mood normal.        Behavior: Behavior normal.    MD Evaluation Airway: WNL Heart: WNL Abdomen: WNL Chest/ Lungs: WNL ASA  Classification: 3 Mallampati/Airway Score: Two  Imaging: MR LUMBAR SPINE WO CONTRAST  Result  Date: 07/18/2021 CLINICAL DATA:  Epidural abscess suspected. Additional provided: Bilateral flank pain, worsening for several weeks, dialysis patient, patient unable to walk due to pain. EXAM: MRI LUMBAR SPINE WITHOUT CONTRAST TECHNIQUE: Multiplanar, multisequence MR imaging of the lumbar spine was performed. No intravenous contrast was administered.  COMPARISON:  Radiographs of the lumbar spine 05/08/2020. FINDINGS: Segmentation: 5 lumbar vertebrae. The caudal most well-formed intervertebral disc space is designated L5-S1. Alignment:  Trace T11-T12, L1-L2 and L3-L4 grade 1 retrolisthesis. Vertebrae: There are Schmorl nodes within the T12 inferior endplate, L1 inferior endplate, L3 superior endplate and L3 inferior endplate. There is minimal marrow edema surrounding the Schmorl node within the T12 inferior endplate, and this Schmorl node may be acute or subacute. Trace multilevel endplate edema elsewhere, which appear degenerative. Mild edema within the right L5 articular pillar also appears degenerative (series 3, image 5). No specific findings to suggest discitis/osteomyelitis. Conus medullaris and cauda equina: Conus extends to the L1-L2 level. No signal abnormality within the visualized distal spinal cord. No epidural abscess is identified on this noncontrast examination. Paraspinal and other soft tissues: Atrophic kidneys. Atrophy of the lumbar paraspinal musculature. Additionally, there is nonspecific edema signal within the subcutaneous fat overlying the thoracolumbar spine bilaterally. Disc levels: Mild disc degeneration throughout the lumbar and visualized lower thoracic spine. T11-T12: Imaged sagittally. Trace grade 1 retrolisthesis. Small disc bulge. Mild partial effacement of the ventral thecal sac without spinal cord mass effect. No appreciable significant foraminal stenosis. T12-L1: Imaged sagittally. Minimal disc bulge. No significant spinal canal or foraminal stenosis. L1-L2: Trace grade 1 retrolisthesis. No significant disc herniation or stenosis. L2-L3: Shallow left subarticular/foraminal disc protrusion. Minimal left subarticular narrowing without appreciable nerve root impingement. Central canal patent. No significant foraminal stenosis. L3-L4: Trace grade 1 retrolisthesis. Disc bulge. Superimposed broad-based central disc protrusion eccentric  to the right. Mild facet arthrosis/ligamentum flavum hypertrophy. Trace right facet joint effusion. Mild bilateral subarticular and central canal narrowing without frank nerve root impingement. Mild relative bilateral neural foraminal narrowing. L4-L5: Disc bulge. Superimposed broad-based central disc protrusion at site of posterior annular fissure, eccentric to the right. Mild facet arthrosis/ligamentum flavum hypertrophy. Trace right facet joint effusion. The disc protrusion contributes to bilateral subarticular narrowing (mild/moderate right, mild left), contacting and crowding the descending right L5 nerve root (series 5, image 23). Mild relative narrowing of the central canal. Bilateral neural foraminal narrowing (mild/moderate right, mild left). L5-S1: Disc bulge with endplate spurring. Superimposed broad-based central disc protrusion at site of posterior annular fissure. Mild facet arthrosis. The disc protrusion contributes to bilateral subarticular stenosis (moderate right, mild left). The disc protrusion contacts and crowds the descending right S1 nerve root. The disc protrusion may also contact the descending left S1 nerve root. Mild relative narrowing of the central canal. No significant foraminal stenosis. IMPRESSION: No specific findings to suggest discitis/osteomyelitis at this time. No evidence of epidural abscess on this non-contrast study. Schmorl nodes within the T12 inferior endplate, L1 inferior endplate, L3 superior endplate and L3 inferior endplate. There is mild marrow edema surrounding the Schmorl node within the T12 inferior endplate, and this Schmorl node may be acute/subacute. Minimal edema elsewhere, as described and degenerative in appearance. Lumbar spondylosis, as outlined and with findings most notably as follows. At L4-L5, there is a broad-based central disc protrusion at site of posterior annular fissure. This contributes to multifactorial bilateral subarticular stenosis  (mild/moderate right, mild left), contacting and crowding the descending right L5 nerve root. Mild central canal narrowing. Bilateral neural foraminal narrowing (mild/moderate  right, mild left). Trace right facet joint effusion. At L5-S1, a broad-based central disc protrusion at site of posterior annular fissure contributes to multifactorial bilateral subarticular stenosis (moderate right, mild left). The disc protrusion contacts and crowds the descending right S1 nerve root. The disc protrusion may also contact the descending left S1 nerve root. Mild relative narrowing of the central canal. No more than mild spinal canal or neural foraminal narrowing at the remaining levels. Trace right facet joint effusion also present at L3-L4. Electronically Signed   By: Kellie Simmering DO   On: 07/18/2021 14:05   US Abdomen Complete  Result Date: 07/17/2021 CLINICAL DATA:  Septic shock EXAM: ABDOMEN ULTRASOUND COMPLETE COMPARISON:  CT 07/16/2021 FINDINGS: Gallbladder: Absent Common bile duct: Diameter: 5 mm in proximal diameter Liver: No focal lesion identified. Within normal limits in parenchymal echogenicity. Portal vein is patent on color Doppler imaging with normal direction of blood flow towards the liver. IVC: No abnormality visualized. Pancreas: Visualized portion unremarkable. Spleen: Size and appearance within normal limits. Right Kidney: Length: 7.8 cm. Renal cortical echogenicity is within normal limits. Mild to moderate renal cortical atrophy is noted. The 2 cortical lesions in question noted on prior CT examination represents simple cysts measuring 2.9 and 2.5 cm in greatest dimension. No solid intrarenal masses are seen. No intrarenal calcifications are seen. There is no hydronephrosis. Left Kidney: Length: 7.7 cm. Renal cortical echogenicity is within normal limits. Mild to moderate cortical atrophy is noted. No hydronephrosis. No intrarenal masses or calcifications are seen. Abdominal aorta: No aneurysm  visualized. Other findings: None. IMPRESSION: Status post cholecystectomy. Mild to moderate bilateral renal cortical atrophy. Indeterminate cortical lesions noted within the right kidney on prior CT examination represents simple cortical cyst. Further follow-up is not required. Electronically Signed   By: Fidela Salisbury MD   On: 07/17/2021 03:09   IR Removal Tun Cv Cath W/O FL  Result Date: 07/19/2021 INDICATION: Patient history of end-stage disease on hemodialysis via tunneled right HD catheter with MSSA bacteremia, request to IR for of tunneled HD catheter line holiday. EXAM: REMOVAL OF TUNNELED HEMODIALYSIS CATHETER MEDICATIONS: 7 mL 1% lidocaine 50 mcg fentanyl 1.5 mg midazolam COMPLICATIONS: None immediate. PROCEDURE: Informed written consent was obtained from the patient following an explanation of the procedure, risks, benefits and alternatives to treatment. A time out was performed prior to the initiation of the procedure. Maximal barrier sterile technique was utilized including caps, mask, sterile gowns, sterile gloves, large sterile drape, hand hygiene, and ChloraPrep. Heparin was removed from ports. There was no suture in place. 1% lidocaine was injected under sterile conditions along the subcutaneous tunnel. Utilizing a combination of blunt dissection and gentle traction, the catheter was removed intact. Hemostasis was obtained with manual compression. A dressing was placed. The patient tolerated the procedure well without immediate post procedural complication. IMPRESSION: Successful removal of tunneled dialysis catheter. Read by Candiss Norse, PA-C Electronically Signed   By: Ruthann Cancer MD   On: 07/19/2021 14:14   DG Chest Port 1 View  Result Date: 07/17/2021 CLINICAL DATA:  Respiratory failure. EXAM: PORTABLE CHEST 1 VIEW COMPARISON:  CT 07/16/2021.  Chest x-ray 07/16/2021. FINDINGS: Dialysis catheter stable position. Cardiomegaly with pulmonary venous congestion noted. Low lung volumes  with mild bibasilar atelectasis. No focal infiltrate or edema. No prominent pleural effusion. No pneumothorax. Reference is made to recent chest CT report for discussion of pulmonary nodules present. No acute bony abnormality identified. IMPRESSION: 1.  Dialysis catheter stable position. 2. Cardiomegaly with pulmonary  venous congestion. Low lung volumes with mild bibasilar atelectasis. No focal infiltrate or edema. Electronically Signed   By: Marcello Moores  Register   On: 07/17/2021 06:26   DG Chest Port 1 View  Result Date: 07/16/2021 CLINICAL DATA:  Questionable sepsis EXAM: PORTABLE CHEST 1 VIEW COMPARISON:  May 28, 2021 FINDINGS: Right internal jugular dual lumen tunneled central venous catheter in stable position. The heart size and mediastinal contours are within normal limits. Streaky bibasilar opacities. No visible pleural effusion or pneumothorax. The visualized skeletal structures are unremarkable. IMPRESSION: Streaky bibasilar opacities, favor atelectasis although developing infection would not be excluded. Electronically Signed   By: Dahlia Bailiff MD   On: 07/16/2021 13:59   ECHOCARDIOGRAM COMPLETE  Result Date: 07/17/2021    ECHOCARDIOGRAM REPORT   Patient Name:   [facility] Date of Exam: 07/17/2021 Medical Rec #:  [id number]            Height:       61.0 in Accession #:    [id number]           Weight:       207.2 lb Date of Birth:  06-Dec-1956             BSA:          1.917 m Patient Age:    30 years             BP:           137/58 mmHg Patient Gender: F                    HR:           89 bpm. Exam Location:  Inpatient Procedure: 2D Echo, Cardiac Doppler and Color Doppler Indications:    Endocarditis  History:        Patient has prior history of Echocardiogram examinations, most                 recent 04/08/2021. CHF, CAD, COPD; Risk Factors:Hypertension and                 Diabetes.  Sonographer:    Bernadene Person RDCS Referring Phys: [id number] Kipp Brood  Sonographer Comments: Image  acquisition challenging due to uncooperative patient. IMPRESSIONS  1. Left ventricular ejection fraction, by estimation, is 70 to 75%. The left ventricle has hyperdynamic function. The left ventricle has no regional wall motion abnormalities. Left ventricular diastolic parameters are consistent with Grade I diastolic dysfunction (impaired relaxation). Elevated left atrial pressure.  2. Right ventricular systolic function is normal. The right ventricular size is normal. The estimated right ventricular systolic pressure is 35.3 mmHg.  3. Left atrial size was mildly dilated.  4. The mitral valve is normal in structure. No evidence of mitral valve regurgitation. Moderate mitral annular calcification.  5. The aortic valve is tricuspid. There is mild calcification of the aortic valve. There is mild thickening of the aortic valve. Aortic valve regurgitation is not visualized. Mild to moderate aortic valve sclerosis/calcification is present, without any evidence of aortic stenosis. Aortic valve mean gradient measures 14.0 mmHg. Aortic valve Vmax measures 2.52 m/s.  6. The inferior vena cava is normal in size with greater than 50% respiratory variability, suggesting right atrial pressure of 3 mmHg. Comparison(s): Prior images unable to be directly viewed, comparison made by report only. Conclusion(s)/Recommendation(s): No evidence of valvular vegetations on this transthoracic echocardiogram. Would recommend a transesophageal echocardiogram to exclude infective endocarditis if clinically indicated. FINDINGS  Left Ventricle: Left ventricular ejection fraction, by estimation, is 70 to 75%. The left ventricle has hyperdynamic function. The left ventricle has no regional wall motion abnormalities. The left ventricular internal cavity size was normal in size. There is no left ventricular hypertrophy. Left ventricular diastolic parameters are consistent with Grade I diastolic dysfunction (impaired relaxation). Elevated left atrial  pressure. Right Ventricle: The right ventricular size is normal. No increase in right ventricular wall thickness. Right ventricular systolic function is normal. The tricuspid regurgitant velocity is 3.15 m/s, and with an assumed right atrial pressure of 3 mmHg, the estimated right ventricular systolic pressure is 45.6 mmHg. Left Atrium: Left atrial size was mildly dilated. Right Atrium: Right atrial size was normal in size. Pericardium: There is no evidence of pericardial effusion. Mitral Valve: The mitral valve is normal in structure. Moderate mitral annular calcification. No evidence of mitral valve regurgitation. Tricuspid Valve: The tricuspid valve is normal in structure. Tricuspid valve regurgitation is trivial. Aortic Valve: Borderline criteria for aortic valve stenosis, gradients are exaggerated by hyperdynamic LV function. The aortic valve is tricuspid. There is mild calcification of the aortic valve. There is mild thickening of the aortic valve. Aortic valve  regurgitation is not visualized. Mild to moderate aortic valve sclerosis/calcification is present, without any evidence of aortic stenosis. Aortic valve mean gradient measures 14.0 mmHg. Aortic valve peak gradient measures 25.5 mmHg. Aortic valve area, by VTI measures 1.77 cm. Pulmonic Valve: The pulmonic valve was grossly normal. Pulmonic valve regurgitation is mild. Aorta: The aortic root is normal in size and structure. Venous: The inferior vena cava is normal in size with greater than 50% respiratory variability, suggesting right atrial pressure of 3 mmHg. IAS/Shunts: No atrial level shunt detected by color flow Doppler.  LEFT VENTRICLE PLAX 2D LVIDd:         4.10 cm  Diastology LVIDs:         2.50 cm  LV e' medial:    7.13 cm/s LV PW:         1.10 cm  LV E/e' medial:  16.8 LV IVS:        1.10 cm  LV e' lateral:   6.60 cm/s LVOT diam:     2.10 cm  LV E/e' lateral: 18.2 LV SV:         90 LV SV Index:   47 LVOT Area:     3.46 cm  RIGHT VENTRICLE RV  S prime:     11.90 cm/s TAPSE (M-mode): 1.9 cm LEFT ATRIUM             Index       RIGHT ATRIUM          Index LA diam:        3.80 cm 1.98 cm/m  RA Area:     9.72 cm LA Vol (A2C):   49.5 ml 25.82 ml/m RA Volume:   16.80 ml 8.76 ml/m LA Vol (A4C):   41.2 ml 21.49 ml/m LA Biplane Vol: 46.5 ml 24.25 ml/m  AORTIC VALVE AV Area (Vmax):    1.96 cm AV Area (Vmean):   1.91 cm AV Area (VTI):     1.77 cm AV Vmax:           252.25 cm/s AV Vmean:          171.500 cm/s AV VTI:            0.510 m AV Peak Grad:      25.5 mmHg AV Mean Grad:  14.0 mmHg LVOT Vmax:         143.00 cm/s LVOT Vmean:        94.500 cm/s LVOT VTI:          0.261 m LVOT/AV VTI ratio: 0.51  AORTA Ao Root diam: 2.70 cm Ao Asc diam:  2.90 cm MITRAL VALVE                TRICUSPID VALVE MV Area (PHT): 2.62 cm     TR Peak grad:   39.7 mmHg MV Decel Time: 289 msec     TR Vmax:        315.00 cm/s MV E velocity: 120.00 cm/s MV A velocity: 151.00 cm/s  SHUNTS MV E/A ratio:  0.79         Systemic VTI:  0.26 m                             Systemic Diam: 2.10 cm Sanda Klein MD Electronically signed by Sanda Klein MD Signature Date/Time: 07/17/2021/2:02:07 PM    Final    [place] CHEST SOFT TISSUE  Result Date: 07/17/2021 CLINICAL DATA:  Evaluate AV fistula abscess. Fever and bacteremia with painful catheter EXAM: ULTRASOUND OF right chest SOFT TISSUES TECHNIQUE: Ultrasound examination was performed in the area of clinical concern. COMPARISON:  None similar FINDINGS: Dialysis catheter in the right chest which had an unremarkable appearance on recent chest x-ray. There is fluid encompassing the catheter along the upper aspect of the subcutaneous portion collection measuring 3-4 mm in maximal thickness. No separate abscess is seen. IMPRESSION: Perma catheter on the right. A thin amount of fluid encompasses the upper subcutaneous catheter, history suggesting purulence. Electronically Signed   By: Monte Fantasia M.D.   On: 07/17/2021 11:38   CT CHEST  ABDOMEN PELVIS WO CONTRAST  Result Date: 07/16/2021 CLINICAL DATA:  Respiratory illness, nondiagnostic xray. Respiratory illness/flank pain EXAM: CT CHEST, ABDOMEN AND PELVIS WITHOUT CONTRAST TECHNIQUE: Multidetector CT imaging of the chest, abdomen and pelvis was performed following the standard protocol without IV contrast. COMPARISON:  CT chest 02/06/2021, CT cardiac 02/27/2021, CT abdomen pelvis 05/18/2012 FINDINGS: CT CHEST FINDINGS Cardiovascular: Normal heart size. No significant pericardial effusion. The thoracic aorta is normal in caliber. At least mild atherosclerotic plaque of the thoracic aorta. Four-vessel coronary artery calcifications. Mitral annular calcifications. The main pulmonary artery enlarged in caliber. Mediastinum/Nodes: No gross hilar adenopathy, noting limited sensitivity for the detection of hilar adenopathy on this noncontrast study. No enlarged mediastinal or axillary lymph nodes. Thyroid gland, trachea, and esophagus demonstrate no significant findings. Possibly tiny hiatal hernia. Lungs/Pleura: Bilateral lower lobe subsegmental atelectasis. Slightly more conspicuous but grossly stable in size triangular subpleural 6 mm pulmonary nodule within the right upper lobe likely representing an intrapulmonary lymph node (4:59). Stable 5 mm left lower lobe pulmonary nodule (4:107). No new pulmonary nodule. No pulmonary mass. No focal consolidation. Musculoskeletal: No chest wall abnormality. No suspicious lytic or blastic osseous lesions. No acute displaced fracture. Multilevel degenerative changes of the spine. CT ABDOMEN PELVIS FINDINGS Hepatobiliary: No focal liver abnormality is seen. Status post cholecystectomy. No biliary dilatation. Pancreas: No focal lesion. Normal pancreatic contour. No surrounding inflammatory changes. No main pancreatic ductal dilatation. Spleen: Normal in size without focal abnormality. Several splenules noted within the left upper lobe with the largest 1  measuring up to 4 cm. Adrenals/Urinary Tract: No adrenal nodule bilaterally. Bilateral renal cortical scarring. No nephrolithiasis, no hydronephrosis. There is a  2.3 cm left renal fluid density lesion that likely represents a simple renal cyst. Slight interval increase in size of two low-density slightly heterogeneous right renal lesions measuring 2.7 and 2.3 cm demonstrating indeterminate densities of 22 and 26 Hounsfield units. Subcentimeter hypodensities are too small to characterize. Otherwise no contour-deforming renal mass. No ureterolithiasis or hydroureter. The urinary bladder is decompressed and grossly unremarkable. Stomach/Bowel: Partial colectomy stomach is within normal limits. No evidence of bowel wall thickening or dilatation. Vascular/Lymphatic: No abdominal aorta or iliac aneurysm. Severe atherosclerotic plaque of the aorta and its branches. No abdominal, pelvic, or inguinal lymphadenopathy. Reproductive: Status post hysterectomy. No adnexal masses. Other: No intraperitoneal free fluid. No intraperitoneal free gas. No organized fluid collection. Musculoskeletal: Small fat containing umbilical hernia. Healed anterior abdominal incision. No suspicious lytic or blastic osseous lesions. No acute displaced fracture. Multilevel degenerative changes of the spine. IMPRESSION: 1. Slightly more conspicuous but grossly stable in size 6 mm triangular right upper lobe subpleural nodule. Stable 5 mm left lower lobe pulmonary nodule. No new pulmonary nodule or mass. Given stability, recommend CT at 12-58months is considered optional for low-risk patients, but is recommended for high-risk patients. This recommendation follows the consensus statement: Guidelines for Management of Incidental Pulmonary Nodules Detected on CT Images: From the Fleischner Society 2017; Radiology 2017; 284:228-243. 2. Enlarged main pulmonary artery suggestive of pulmonary hypertension. 3. Interval increase in size of two indeterminate  slightly heterogeneous 2.3cm and 2.7 cm right renal lesions. Recommend MRI renal protocol for further evaluation. 4. Aortic Atherosclerosis (ICD10-I70.0) including four-vessel coronary artery calcifications and mitral annular calcifications. 5. Please note limited evaluation due to noncontrast study. Electronically Signed   By: Iven Finn M.D.   On: 07/16/2021 16:50    Labs:  CBC: Recent Labs    07/17/21 0408 07/18/21 0341 07/18/21 2345 07/20/21 0149  WBC 29.1* 20.4* 14.3* 13.2*  HGB 10.7* 8.1* 9.8* 9.3*  HCT 34.8* 25.6* 30.0* 28.8*  PLT 146* 159 129* 160    COAGS: Recent Labs    05/28/21 0842 07/16/21 1402 07/17/21 0408 07/17/21 1414  INR 1.2 1.3*  --   --   APTT  --  24 41* 45*    BMP: Recent Labs    07/17/21 0408 07/18/21 1430 07/18/21 2345 07/20/21 0149  NA 131* 133* 131* 136  K 4.9 4.5 3.5 3.8  CL 101 104 98 102  CO2 15* 14* 21* 21*  GLUCOSE 131* 113* 138* 121*  BUN 30* 50* 23 34*  CALCIUM 7.3* 7.0* 6.5* 6.9*  CREATININE 7.20* 8.91* 5.80* 8.09*  GFRNONAA 6* 5* 8* 5*    LIVER FUNCTION TESTS: Recent Labs    03/15/21 1143 05/17/21 1219 05/28/21 0842 05/30/21 0119 05/31/21 0227 07/16/21 1402 07/18/21 1430  BILITOT 0.4 0.2* 0.3  --   --  1.2  --   AST 19 18 20   --   --  39  --   ALT 19 15 16   --   --  21  --   ALKPHOS 110 112 111  --   --  93  --   PROT 7.4 7.3 7.2  --   --  7.8  --   ALBUMIN 3.7* 2.9* 2.7* 2.5* 2.6* 2.7* 2.0*    TUMOR MARKERS: No results for input(s): AFPTM, CEA, CA199, CHROMGRNA in the last 8760 hours.  Assessment and Plan: 65 y.o. female with ESRD, on renal replacement therapy.  Patient is known to IR service for previous HD catheter placement, which was removed yesterday  for a line holiday due to MSSA bacteremia.  IR was requested for tunneled HD catheter placement.   Chart review showed repeat blood cx from 7/21 still pending.  ID was consulted, ok to proceed with tunneled HD catheter if blood cx still shows no  growth tomorrow.  Patient requested sedation for tunneled HD removal yesterday, will plan placing either tunneled or temporary HD catheter with sedation tomorrow.  Made npo at midnight.  Ancef ordered.   Risks and benefits discussed with the patient including, but not limited to bleeding, infection, vascular injury, pneumothorax which may require chest tube placement, air embolism or even death  All of the patient's questions were answered, patient is agreeable to proceed. Consent signed and in chart.   Thank you for this interesting consult.  I greatly enjoyed meeting Dinora Hemm and look forward to participating in their care.  A copy of this report was sent to the requesting provider on this date.  Electronically Signed: Tera Mater, PA-C 07/20/2021, 11:58 AM   I spent a total of  40 Minutes   in face to face in clinical consultation, greater than 50% of which was counseling/coordinating care for tunneled HD catheter placement.

## 2021-07-20 NOTE — Plan of Care (Signed)
  Problem: Health Behavior/Discharge Planning: Goal: Ability to manage health-related needs will improve Outcome: Progressing   

## 2021-07-21 ENCOUNTER — Inpatient Hospital Stay (HOSPITAL_COMMUNITY): Payer: Medicare Other

## 2021-07-21 DIAGNOSIS — B9561 Methicillin susceptible Staphylococcus aureus infection as the cause of diseases classified elsewhere: Secondary | ICD-10-CM | POA: Diagnosis not present

## 2021-07-21 DIAGNOSIS — R7881 Bacteremia: Secondary | ICD-10-CM | POA: Diagnosis not present

## 2021-07-21 HISTORY — PX: IR US GUIDE VASC ACCESS RIGHT: IMG2390

## 2021-07-21 HISTORY — PX: IR FLUORO GUIDE CV LINE RIGHT: IMG2283

## 2021-07-21 LAB — BASIC METABOLIC PANEL
Anion gap: 15 (ref 5–15)
BUN: 42 mg/dL — ABNORMAL HIGH (ref 8–23)
CO2: 17 mmol/L — ABNORMAL LOW (ref 22–32)
Calcium: 7 mg/dL — ABNORMAL LOW (ref 8.9–10.3)
Chloride: 101 mmol/L (ref 98–111)
Creatinine, Ser: 9.47 mg/dL — ABNORMAL HIGH (ref 0.44–1.00)
GFR, Estimated: 4 mL/min — ABNORMAL LOW (ref >60)
Glucose, Bld: 126 mg/dL — ABNORMAL HIGH (ref 70–99)
Potassium: 3.6 mmol/L (ref 3.5–5.1)
Sodium: 133 mmol/L — ABNORMAL LOW (ref 135–145)

## 2021-07-21 LAB — CULTURE, BLOOD (ROUTINE X 2)
Culture: NO GROWTH
Special Requests: ADEQUATE

## 2021-07-21 LAB — HEPARIN LEVEL (UNFRACTIONATED)
Heparin Unfractionated: 0.37 IU/mL (ref 0.30–0.70)
Heparin Unfractionated: 0.37 IU/mL (ref 0.30–0.70)

## 2021-07-21 LAB — CBC
HCT: 27.7 % — ABNORMAL LOW (ref 36.0–46.0)
Hemoglobin: 8.9 g/dL — ABNORMAL LOW (ref 12.0–15.0)
MCH: 29.2 pg (ref 26.0–34.0)
MCHC: 32.1 g/dL (ref 30.0–36.0)
MCV: 90.8 fL (ref 80.0–100.0)
Platelets: 206 10*3/uL (ref 150–400)
RBC: 3.05 MIL/uL — ABNORMAL LOW (ref 3.87–5.11)
RDW: 15.9 % — ABNORMAL HIGH (ref 11.5–15.5)
WBC: 15.5 10*3/uL — ABNORMAL HIGH (ref 4.0–10.5)
nRBC: 0.1 % (ref 0.0–0.2)

## 2021-07-21 LAB — MAGNESIUM: Magnesium: 1.8 mg/dL (ref 1.7–2.4)

## 2021-07-21 IMAGING — XA IR FLUORO GUIDE CV LINE*R*
2 series · 3 of 3 positions shown · non-contrast
Comparison: none

CLINICAL DATA: Insert renal disease and status post removal of
tunneled right jugular hemodialysis catheter on [DATE] due to
bacteremia. Bacteremia has been treated and the patient now requires
placement of a new tunneled dialysis catheter for continued
hemodialysis.

[Series 1: ir fluoro guide cv line*right* · 1 of 1 slices shown]
[im 1/1]
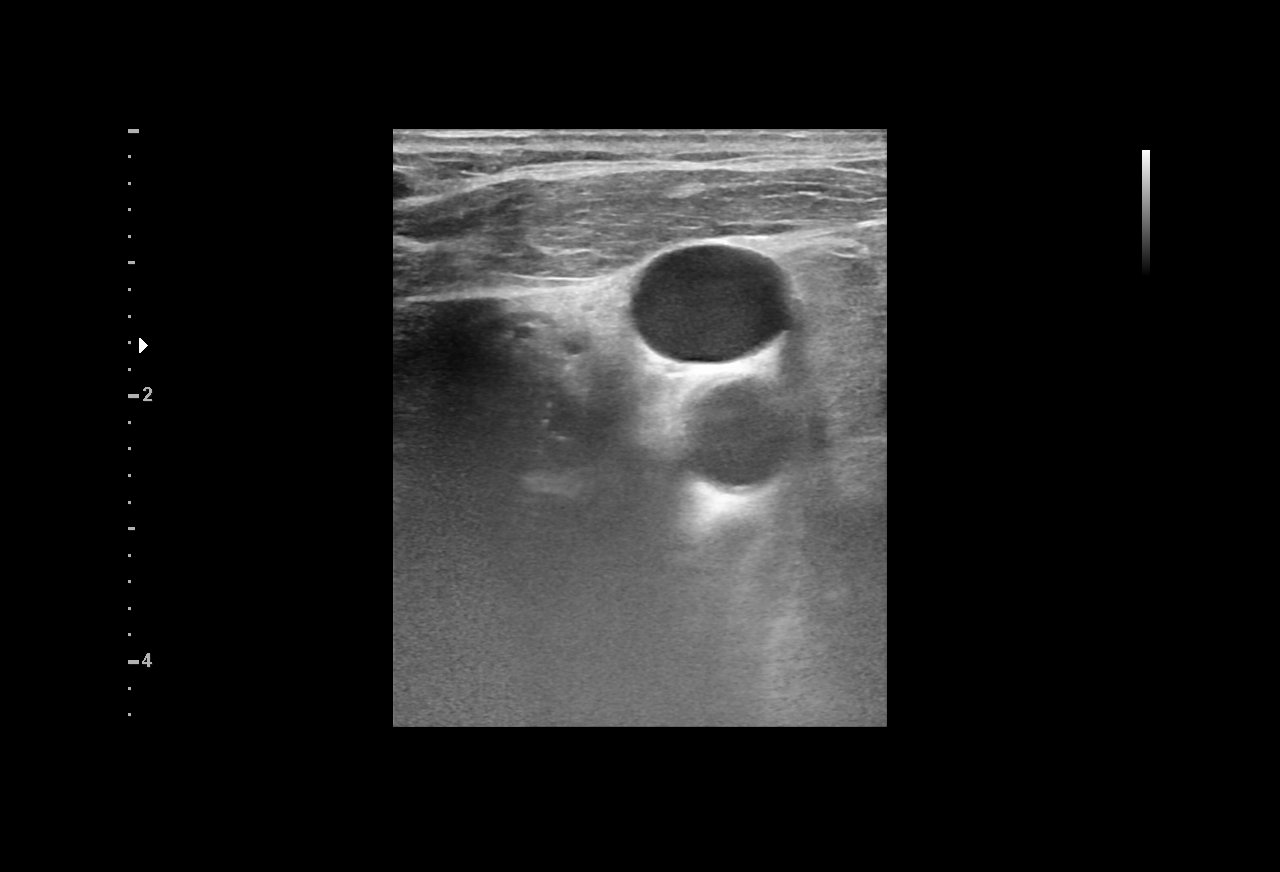

[Series 1: fl (-) angio · 2 of 2 slices shown]
[im 1/2]
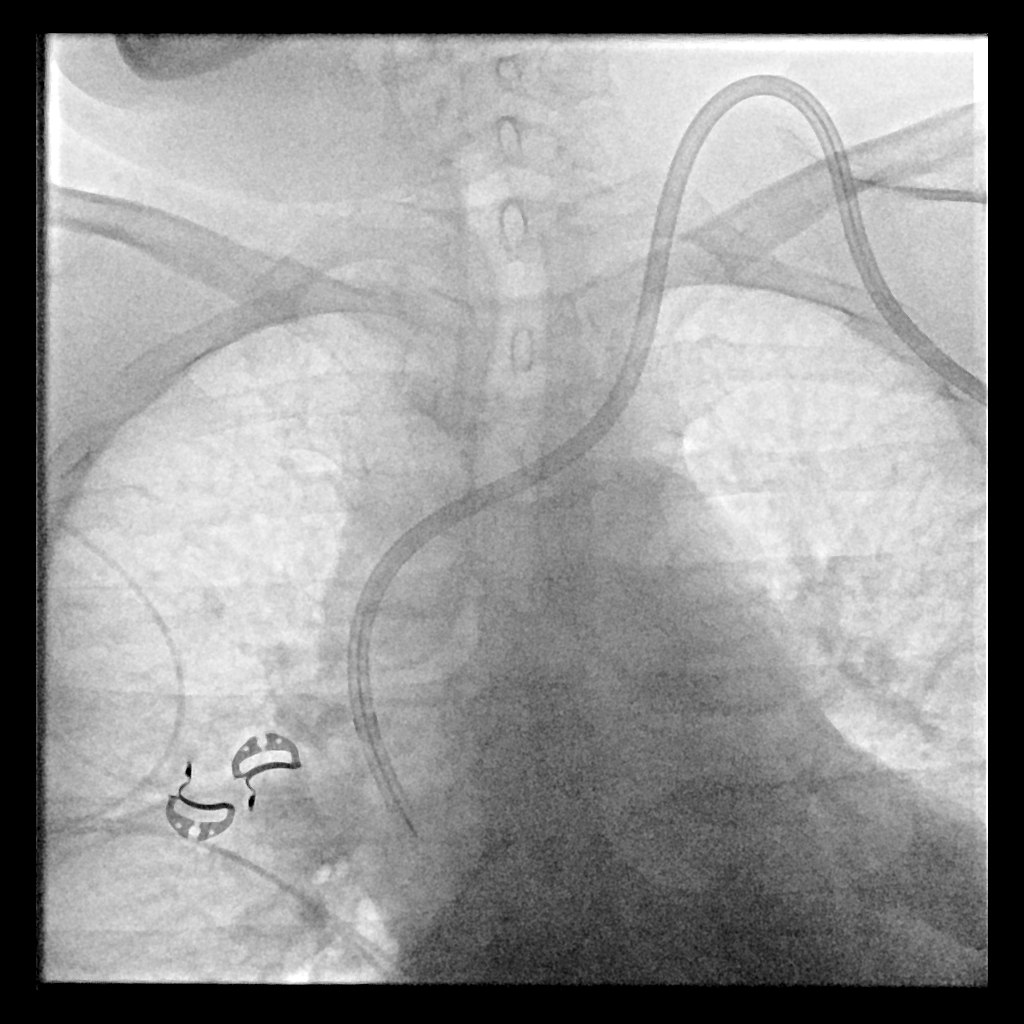
[im 2/2]
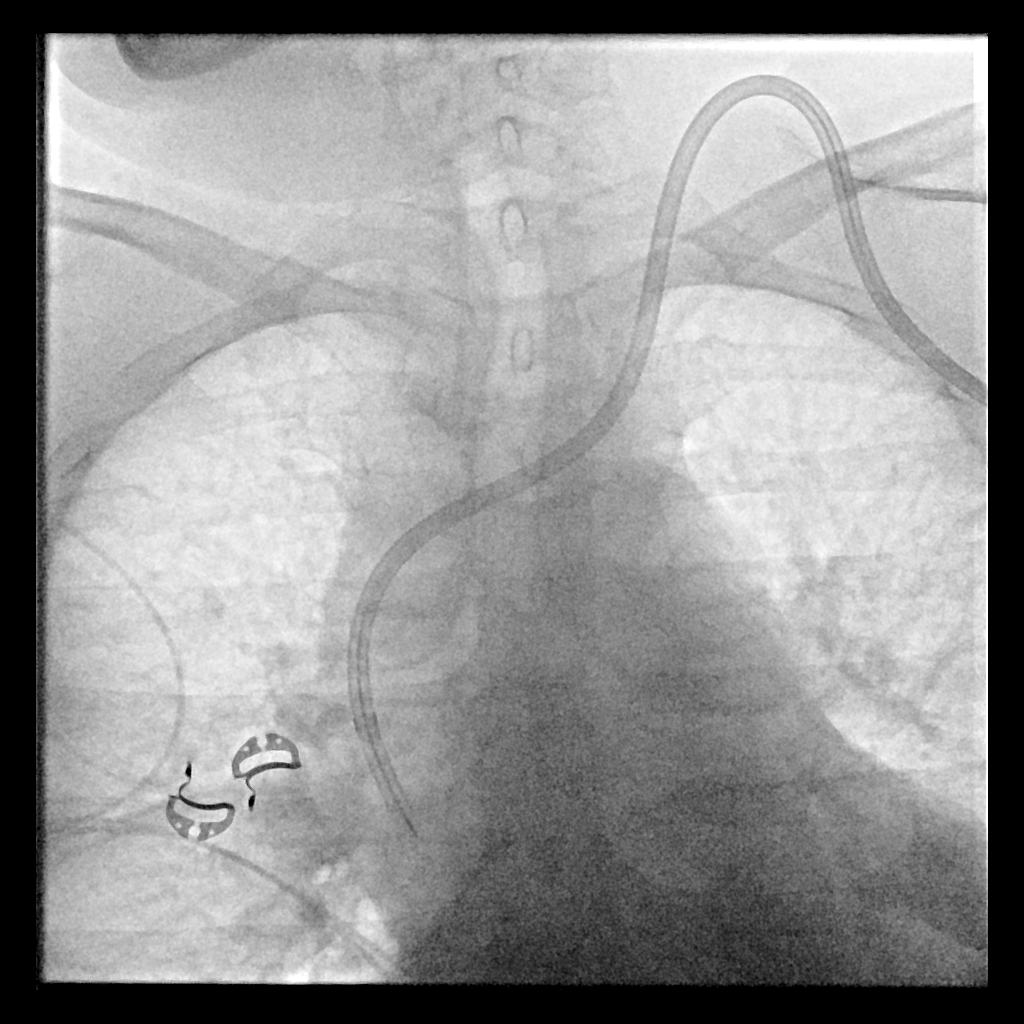

[3 of 3 positions shown; findings below may reference images not displayed]

EXAM:
TUNNELED CENTRAL VENOUS HEMODIALYSIS CATHETER PLACEMENT WITH
ULTRASOUND AND FLUOROSCOPIC GUIDANCE

ANESTHESIA/SEDATION:
1.5 mg IV Versed; 50 mcg IV Fentanyl.

Total Moderate Sedation Time:   25 minutes.

The patient's level of consciousness and physiologic status were
continuously monitored during the procedure by Radiology nursing.

MEDICATIONS:
No additional medications.

FLUOROSCOPY TIME:  2 minutes and 36 seconds.  18.0 mGy.

PROCEDURE:
The procedure, risks, benefits, and alternatives were explained to
the patient. Questions regarding the procedure were encouraged and
answered. The patient understands and consents to the procedure. A
timeout was performed prior to initiating the procedure.

The left neck and chest were prepped with chlorhexidine in a sterile
fashion, and a sterile drape was applied covering the operative
field. Maximum barrier sterile technique with sterile gowns and
gloves were used for the procedure. Local anesthesia was provided
with 1% lidocaine.

Ultrasound was used to confirm patency of the left internal jugular
vein. After creating a small venotomy incision, a 21 gauge needle
was advanced into the left internal jugular vein under direct,
real-time ultrasound guidance. Ultrasound image documentation was
performed. After securing guidewire access, an 8 Fr dilator was
placed. A J-wire was kinked to measure appropriate catheter length.

A Palindrome tunneled hemodialysis catheter measuring 28 cm from tip
to cuff was chosen for placement. This was tunneled in a retrograde
fashion from the chest wall to the venotomy incision.

At the venotomy, serial dilatation was performed and a 15 Fr
peel-away sheath was placed over a guidewire. The catheter was then
placed through the sheath and the sheath removed. Final catheter
positioning was confirmed and documented with a fluoroscopic spot
image. The catheter was aspirated, flushed with saline, and injected
with appropriate volume heparin dwells.

The venotomy incision was closed with subcuticular 4-0 Vicryl.
Dermabond was applied to the incision. The catheter exit site was
secured with 0-Prolene retention sutures.

COMPLICATIONS:
None.  No pneumothorax.
FINDINGS: Initial ultrasound demonstrates occlusive thrombus in the right
internal jugular vein after removal of the recent tunneled catheter.
The left internal jugular vein is normally patent. After new
left-sided catheter placement, the tip lies in the right atrium. The
catheter aspirates normally and is ready for immediate use.
IMPRESSION: Placement of tunneled hemodialysis catheter via the left internal
jugular vein. The catheter tip lies in the right atrium. The
catheter is ready for immediate use.

## 2021-07-21 MED ORDER — HEPARIN SODIUM (PORCINE) 1000 UNIT/ML IJ SOLN
INTRAMUSCULAR | Status: AC
  Filled 2021-07-21: qty 5

## 2021-07-21 MED ORDER — LIDOCAINE HCL (PF) 1 % IJ SOLN
INTRAMUSCULAR | Status: AC | PRN
Start: 2021-07-21 — End: 2021-07-21
  Administered 2021-07-21: 20 mL

## 2021-07-21 MED ORDER — LIDOCAINE HCL (PF) 1 % IJ SOLN
INTRAMUSCULAR | Status: AC
  Filled 2021-07-21: qty 30

## 2021-07-21 MED ORDER — FENTANYL CITRATE (PF) 100 MCG/2ML IJ SOLN
INTRAMUSCULAR | Status: AC
  Filled 2021-07-21: qty 2

## 2021-07-21 MED ORDER — HEPARIN (PORCINE) 25000 UT/250ML-% IV SOLN
1700.0000 [IU]/h | INTRAVENOUS | Status: DC
  Administered 2021-07-21 – 2021-07-22 (×2): 1700 [IU]/h via INTRAVENOUS
  Filled 2021-07-21 (×3): qty 250

## 2021-07-21 MED ORDER — MIDAZOLAM HCL 2 MG/2ML IJ SOLN
INTRAMUSCULAR | Status: AC | PRN
Start: 2021-07-21 — End: 2021-07-21
  Administered 2021-07-21: 0.5 mg via INTRAVENOUS
  Administered 2021-07-21: 1 mg via INTRAVENOUS

## 2021-07-21 MED ORDER — HEPARIN SODIUM (PORCINE) 1000 UNIT/ML IJ SOLN
INTRAMUSCULAR | Status: AC
  Filled 2021-07-21: qty 1

## 2021-07-21 MED ORDER — FENTANYL CITRATE (PF) 100 MCG/2ML IJ SOLN
INTRAMUSCULAR | Status: AC | PRN
  Administered 2021-07-21 (×2): 25 ug via INTRAVENOUS

## 2021-07-21 MED ORDER — MIDAZOLAM HCL 2 MG/2ML IJ SOLN
INTRAMUSCULAR | Status: AC
  Filled 2021-07-21: qty 2

## 2021-07-21 NOTE — Progress Notes (Signed)
PROGRESS NOTE  Whitney Walker EUM:[id number] DOB: Jan 01, 1956 DOA: 07/16/2021 PCP: [name], FNP  HPI/Recap of past 24 hours: Patient seen and examined at bedside patient has just returned from  Assessment/Plan: Active Problems:   ESRD (end stage renal disease) ([place])   Septic shock ([place])   MSSA bacteremia   Acute low back pain #1 end-stage renal disease on hemodialysis Monday Wednesday and Friday  2.  Septic shock secondary to MSSA bacteremia, POA [facility] hemod[facility] ID following Patient is currently on cefazolin which she will be on for 6 weeks with dialysis on dialysis days  3.  History of DVT and PE Continue heparin  4.  Hypertension hyperlipidemia peripheral vascular disease/coronary artery disease Continue atorvastatin Amlodipine and carvedilol on hold  5.  Right upper lobe and left lower lobe pulmonary nodules: Stable Follow-up CT in 12 to 18 months  Code Status: FULL  Severity of Illness: The appropriate patient status for this patient is INPATIENT. Inpatient status is judged to be reasonable and necessary in order to provide the required intensity of service to ensure the patient's safety. The patient's presenting symptoms, physical exam findings, and initial radiographic and laboratory data in the context of their chronic comorbidities is felt to place them at high risk for further clinical deterioration. Furthermore, it is not anticipated that the patient will be medically stable for discharge from the hospital within 2 midnights of admission. The following factors support the patient status of inpatient.   " Catheter replacement and need for hemodialysis   * I certify that at the point of admission it is my clinical judgment that the patient will require inpatient hospital care spanning beyond 2 midnights from the point of admission due to high intensity of service, high risk for further deterioration and high frequency of surveillance  required.*   Family Communication: None at bedside  Disposition Plan: Home when stable Status is: Inpatient   Dispo: The patient is from: Home              Anticipated d/c is to:               Anticipated d/c date is:               Patient currently not medically stable for discharge  Consultants: Nephrology Infectious disease IR PCCM  Procedures: Temporary cath placement tunneled left IJ hemodialysis catheter placement Hemodialysis  Antimicrobials: Cefazolin  DVT prophylaxis: Heparin   Objective: Vitals:   07/20/21 0541 07/20/21 0943 07/20/21 2147 07/21/21 0541  BP: 129/60 (!) 124/93 (!) 147/86 (!) 147/84  Pulse: 82 91 86 85  Resp: 16 18 20 17   Temp: 98 F (36.7 C) 98.2 F (36.8 C) 97.9 F (36.6 C) 98.4 F (36.9 C)  TempSrc: Oral Oral Oral Oral  SpO2: 90% 91% 92% 94%  Weight:    97.3 kg  Height:        Intake/Output Summary (Last 24 hours) at 07/21/2021 0918 Last data filed at 07/20/2021 1700 Gross per 24 hour  Intake 600 ml  Output 0 ml  Net 600 ml   Filed Weights   07/18/21 1707 07/19/21 2205 07/21/21 0541  Weight: 92 kg 95.5 kg 97.3 kg   Body mass index is 40.55 kg/m.  Exam:  General: 65 y.o. year-old female well developed well nourished in no acute distress.  Alert and oriented x3.  Obese Cardiovascular: Regular rate and rhythm with no rubs or gallops.  No thyromegaly or JVD noted.  Respiratory: Clear to auscultation with no wheezes or rales. Good inspiratory effort. Abdomen: Soft nontender nondistended with normal bowel sounds x4 quadrants. Musculoskeletal: No lower extremity edema. 2/4 pulses in all 4 extremities. Skin: No ulcerative lesions noted or rashes, Psychiatry: Mood is appropriate for condition and setting Neurology:    Data Reviewed: CBC: Recent Labs  Lab 07/16/21 1402 07/17/21 0408 07/18/21 0341 07/18/21 2345 07/20/21 0149 07/21/21 0120  WBC 26.9* 29.1* 20.4* 14.3* 13.2* 15.5*  NEUTROABS 23.8*  --   --   --   --   --    HGB 11.9* 10.7* 8.1* 9.8* 9.3* 8.9*  HCT 37.8 34.8* 25.6* 30.0* 28.8* 27.7*  MCV 92.2 94.3 92.4 89.8 91.1 90.8  PLT 143* 146* 159 129* 160 976   Basic Metabolic Panel: Recent Labs  Lab 07/17/21 0408 07/18/21 1430 07/18/21 2345 07/20/21 0149 07/21/21 0120  NA 131* 133* 131* 136 133*  K 4.9 4.5 3.5 3.8 3.6  CL 101 104 98 102 101  CO2 15* 14* 21* 21* 17*  GLUCOSE 131* 113* 138* 121* 126*  BUN 30* 50* 23 34* 42*  CREATININE 7.20* 8.91* 5.80* 8.09* 9.47*  CALCIUM 7.3* 7.0* 6.5* 6.9* 7.0*  MG 1.7  --   --   --  1.8  PHOS 6.5* 5.9*  --   --   --    GFR: Estimated Creatinine Clearance: 6.2 mL/min (A) (by C-G formula based on SCr of 9.47 mg/dL (H)). Liver Function Tests: Recent Labs  Lab 07/16/21 1402 07/18/21 1430  AST 39  --   ALT 21  --   ALKPHOS 93  --   BILITOT 1.2  --   PROT 7.8  --   ALBUMIN 2.7* 2.0*   No results for input(s): LIPASE, AMYLASE in the last 168 hours. No results for input(s): AMMONIA in the last 168 hours. Coagulation Profile: Recent Labs  Lab 07/16/21 1402  INR 1.3*   Cardiac Enzymes: No results for input(s): CKTOTAL, CKMB, CKMBINDEX, TROPONINI in the last 168 hours. BNP (last 3 results) No results for input(s): PROBNP in the last 8760 hours. HbA1C: No results for input(s): HGBA1C in the last 72 hours. CBG: Recent Labs  Lab 07/19/21 1135 07/19/21 1621 07/19/21 2017 07/20/21 0047 07/20/21 0416  GLUCAP 95 120* 150* 130* 126*   Lipid Profile: No results for input(s): CHOL, HDL, LDLCALC, TRIG, CHOLHDL, LDLDIRECT in the last 72 hours. Thyroid Function Tests: No results for input(s): TSH, T4TOTAL, FREET4, T3FREE, THYROIDAB in the last 72 hours. Anemia Panel: No results for input(s): VITAMINB12, FOLATE, FERRITIN, TIBC, IRON, RETICCTPCT in the last 72 hours. Urine analysis:    Component Value Date/Time   COLORURINE YELLOW 03/02/2019 1416   APPEARANCEUR CLEAR 03/02/2019 1416   LABSPEC 1.010 03/02/2019 1416   PHURINE 6.0 03/02/2019 1416    GLUCOSEU 50 (A) 03/02/2019 1416   HGBUR SMALL (A) 03/02/2019 1416   BILIRUBINUR NEGATIVE 03/02/2019 1416   KETONESUR 5 (A) 03/02/2019 1416   PROTEINUR 100 (A) 03/02/2019 1416   UROBILINOGEN 0.2 10/19/2015 1532   NITRITE NEGATIVE 03/02/2019 1416   LEUKOCYTESUR NEGATIVE 03/02/2019 1416   Sepsis Labs: @LABRCNTIP (procalcitonin:4,lacticidven:4)  ) Recent Results (from the past 240 hour(s))  Blood Culture (routine x 2)     Status: None   Collection Time: 07/16/21  1:15 PM   Specimen: BLOOD  Result Value Ref Range Status   Specimen Description BLOOD BLOOD RIGHT FOREARM  Final   Special Requests   Final    BOTTLES DRAWN AEROBIC AND ANAEROBIC  Blood Culture adequate volume   Culture   Final    NO GROWTH 5 DAYS Performed at [facility], [place] [address] [place], [place] [address]    Report Status 07/21/2021 FINAL  Final  Resp Panel by RT-PCR (Flu A&B, Covid) Nasopharyngeal Swab     Status: None   Collection Time: 07/16/21  1:17 PM   Specimen: Nasopharyngeal Swab; Nasopharyngeal(NP) swabs in vial transport medium  Result Value Ref Range Status   SARS Coronavirus 2 by RT PCR NEGATIVE NEGATIVE Final    Comment: (NOTE) SARS-CoV-2 target nucleic acids are NOT DETECTED.  The SARS-CoV-2 RNA is generally detectable in upper respiratory specimens during the acute phase of infection. The lowest concentration of SARS-CoV-2 viral copies this assay can detect is 138 copies/mL. A negative result does not preclude SARS-Cov-2 infection and should not be used as the sole basis for treatment or other patient management decisions. A negative result may occur with  improper specimen collection/handling, submission of specimen other than nasopharyngeal swab, presence of viral mutation(s) within the areas targeted by this assay, and inadequate number of viral copies(<138 copies/mL). A negative result must be combined with clinical observations, patient history, and epidemiological information.  The expected result is Negative.  Fact Sheet for Patients:  EntrepreneurPulse.com.au  Fact Sheet for Healthcare Providers:  IncredibleEmployment.be  This test is no t yet approved or cleared by the [place] FDA and  has been authorized for detection and/or diagnosis of SARS-CoV-2 by FDA under an Emergency Use Authorization (EUA). This EUA will remain  in effect (meaning this test can be used) for the duration of the COVID-19 declaration under Section 564(b)(1) of the Act, 21 U.S.C.section 360bbb-3(b)(1), unless the authorization is terminated  or revoked sooner.       Influenza A by PCR NEGATIVE NEGATIVE Final   Influenza B by PCR NEGATIVE NEGATIVE Final    Comment: (NOTE) The Xpert Xpress SARS-CoV-2/FLU/RSV plus assay is intended as an aid in the diagnosis of influenza from Nasopharyngeal swab specimens and should not be used as a sole basis for treatment. Nasal washings and aspirates are unacceptable for Xpert Xpress SARS-CoV-2/FLU/RSV testing.  Fact Sheet for Patients: EntrepreneurPulse.com.au  Fact Sheet for Healthcare Providers: IncredibleEmployment.be  This test is not yet approved or cleared by the [place] FDA and has been authorized for detection and/or diagnosis of SARS-CoV-2 by FDA under an Emergency Use Authorization (EUA). This EUA will remain in effect (meaning this test can be used) for the duration of the COVID-19 declaration under Section 564(b)(1) of the Act, 21 U.S.C. section 360bbb-3(b)(1), unless the authorization is terminated or revoked.  Performed at [facility], [place] [address], [place], [place] [address]   Blood Culture (routine x 2)     Status: Abnormal   Collection Time: 07/16/21  2:04 PM   Specimen: BLOOD RIGHT WRIST  Result Value Ref Range Status   Specimen Description BLOOD RIGHT WRIST  Final   Special Requests   Final    BOTTLES DRAWN AEROBIC AND  ANAEROBIC Blood Culture results may not be optimal due to an inadequate volume of blood received in culture bottles   Culture  Setup Time   Final    GRAM POSITIVE COCCI IN CLUSTERS IN BOTH AEROBIC AND ANAEROBIC BOTTLES CRITICAL RESULT CALLED TO, READ BACK BY AND VERIFIED WITHEather Colas PHARMD, AT 1856 07/17/21 [place] Performed at [facility], [place] [address] [place], [place] [address]    Culture STAPHYLOCOCCUS AUREUS (  A)  Final   Report Status 07/19/2021 FINAL  Final   Organism ID, Bacteria STAPHYLOCOCCUS AUREUS  Final      Susceptibility   Staphylococcus aureus - MIC*    CIPROFLOXACIN <=0.5 SENSITIVE Sensitive     ERYTHROMYCIN <=0.25 SENSITIVE Sensitive     GENTAMICIN <=0.5 SENSITIVE Sensitive     OXACILLIN <=0.25 SENSITIVE Sensitive     TETRACYCLINE <=1 SENSITIVE Sensitive     VANCOMYCIN <=0.5 SENSITIVE Sensitive     TRIMETH/SULFA 160 RESISTANT Resistant     CLINDAMYCIN <=0.25 SENSITIVE Sensitive     RIFAMPIN <=0.5 SENSITIVE Sensitive     Inducible Clindamycin NEGATIVE Sensitive     * STAPHYLOCOCCUS AUREUS  Blood Culture ID Panel (Reflexed)     Status: Abnormal   Collection Time: 07/16/21  2:04 PM  Result Value Ref Range Status   Enterococcus faecalis NOT DETECTED NOT DETECTED Final   Enterococcus Faecium NOT DETECTED NOT DETECTED Final   Listeria monocytogenes NOT DETECTED NOT DETECTED Final   Staphylococcus species DETECTED (A) NOT DETECTED Final    Comment: CRITICAL RESULT CALLED TO, READ BACK BY AND VERIFIED WITH: C. AMEND PHARMD, AT 0450 07/17/21 D. VANHOOK    Staphylococcus aureus (BCID) DETECTED (A) NOT DETECTED Final    Comment: CRITICAL RESULT CALLED TO, READ BACK BY AND VERIFIED WITH: C. AMEND PHARMD, AT 9604 07/17/21 D. VANHOOK    Staphylococcus epidermidis NOT DETECTED NOT DETECTED Final   Staphylococcus lugdunensis NOT DETECTED NOT DETECTED Final   Streptococcus species NOT DETECTED NOT DETECTED Final   Streptococcus agalactiae NOT DETECTED NOT DETECTED  Final   Streptococcus pneumoniae NOT DETECTED NOT DETECTED Final   Streptococcus pyogenes NOT DETECTED NOT DETECTED Final   A.calcoaceticus-baumannii NOT DETECTED NOT DETECTED Final   Bacteroides fragilis NOT DETECTED NOT DETECTED Final   Enterobacterales NOT DETECTED NOT DETECTED Final   Enterobacter cloacae complex NOT DETECTED NOT DETECTED Final   Escherichia coli NOT DETECTED NOT DETECTED Final   Klebsiella aerogenes NOT DETECTED NOT DETECTED Final   Klebsiella oxytoca NOT DETECTED NOT DETECTED Final   Klebsiella pneumoniae NOT DETECTED NOT DETECTED Final   Proteus species NOT DETECTED NOT DETECTED Final   Salmonella species NOT DETECTED NOT DETECTED Final   Serratia marcescens NOT DETECTED NOT DETECTED Final   Haemophilus influenzae NOT DETECTED NOT DETECTED Final   Neisseria meningitidis NOT DETECTED NOT DETECTED Final   Pseudomonas aeruginosa NOT DETECTED NOT DETECTED Final   Stenotrophomonas maltophilia NOT DETECTED NOT DETECTED Final   Candida albicans NOT DETECTED NOT DETECTED Final   Candida auris NOT DETECTED NOT DETECTED Final   Candida glabrata NOT DETECTED NOT DETECTED Final   Candida krusei NOT DETECTED NOT DETECTED Final   Candida parapsilosis NOT DETECTED NOT DETECTED Final   Candida tropicalis NOT DETECTED NOT DETECTED Final   Cryptococcus neoformans/gattii NOT DETECTED NOT DETECTED Final   Meth resistant mecA/C and MREJ NOT DETECTED NOT DETECTED Final    Comment: Performed at [facility], [address], [place], [place] [address]  Gastrointestinal Panel by PCR , Stool     Status: None   Collection Time: 07/16/21  3:08 PM   Specimen: STOOL  Result Value Ref Range Status   Campylobacter species NOT DETECTED NOT DETECTED Final   Plesimonas shigelloides NOT DETECTED NOT DETECTED Final   Salmonella species NOT DETECTED NOT DETECTED Final   Yersinia enterocolitica NOT DETECTED NOT DETECTED Final   Vibrio species NOT DETECTED NOT DETECTED Final   Vibrio  cholerae NOT DETECTED  NOT DETECTED Final   Enteroaggregative E coli (EAEC) NOT DETECTED NOT DETECTED Final   Enteropathogenic E coli (EPEC) NOT DETECTED NOT DETECTED Final   Enterotoxigenic E coli (ETEC) NOT DETECTED NOT DETECTED Final   Shiga like toxin producing E coli (STEC) NOT DETECTED NOT DETECTED Final   Shigella/Enteroinvasive E coli (EIEC) NOT DETECTED NOT DETECTED Final   Cryptosporidium NOT DETECTED NOT DETECTED Final   Cyclospora cayetanensis NOT DETECTED NOT DETECTED Final   Entamoeba histolytica NOT DETECTED NOT DETECTED Final   Giardia lamblia NOT DETECTED NOT DETECTED Final   Adenovirus F40/41 NOT DETECTED NOT DETECTED Final   Astrovirus NOT DETECTED NOT DETECTED Final   Norovirus GI/GII NOT DETECTED NOT DETECTED Final   Rotavirus A NOT DETECTED NOT DETECTED Final   Sapovirus (I, II, IV, and V) NOT DETECTED NOT DETECTED Final    Comment: Performed at [facility], [address], [place], [place] [address]  C Difficile Quick Screen w PCR reflex     Status: None   Collection Time: 07/16/21  3:08 PM   Specimen: STOOL  Result Value Ref Range Status   C Diff antigen NEGATIVE NEGATIVE Final   C Diff toxin NEGATIVE NEGATIVE Final   C Diff interpretation No C. difficile detected.  Final    Comment: Performed at [facility], [place] [address], [place], [place] [address]  Culture, blood (Routine X 2) w Reflex to ID Panel     Status: None (Preliminary result)   Collection Time: 07/17/21  8:45 AM   Specimen: BLOOD RIGHT ARM  Result Value Ref Range Status   Specimen Description BLOOD RIGHT ARM  Final   Special Requests   Final    BOTTLES DRAWN AEROBIC AND ANAEROBIC Blood Culture adequate volume   Culture   Final    NO GROWTH 4 DAYS Performed at [facility], [place] [address], [place], [place] [address]    Report Status PENDING  Incomplete  Culture, blood (Routine X 2) w Reflex to ID Panel     Status: None (Preliminary result)   Collection Time: 07/17/21   8:55 AM   Specimen: BLOOD LEFT HAND  Result Value Ref Range Status   Specimen Description BLOOD LEFT HAND  Final   Special Requests   Final    BOTTLES DRAWN AEROBIC AND ANAEROBIC Blood Culture adequate volume   Culture   Final    NO GROWTH 4 DAYS Performed at [facility], [place] [address] [place], [place] [address]    Report Status PENDING  Incomplete  Culture, blood (routine x 2)     Status: None (Preliminary result)   Collection Time: 07/18/21  3:41 AM   Specimen: BLOOD RIGHT HAND  Result Value Ref Range Status   Specimen Description BLOOD RIGHT HAND  Final   Special Requests   Final    BOTTLES DRAWN AEROBIC ONLY Blood Culture adequate volume   Culture   Final    NO GROWTH 3 DAYS Performed at [facility], [place] [address], [place], [place] [address]    Report Status PENDING  Incomplete  Culture, blood (routine x 2)     Status: None (Preliminary result)   Collection Time: 07/18/21  3:41 AM   Specimen: BLOOD RIGHT HAND  Result Value Ref Range Status   Specimen Description BLOOD RIGHT HAND  Final   Special Requests   Final    BOTTLES DRAWN AEROBIC ONLY Blood Culture adequate volume   Culture  Final    NO GROWTH 3 DAYS Performed at [facility], [place] [address] [place], [place] [address]    Report Status PENDING  Incomplete  Culture, blood (routine x 2)     Status: None (Preliminary result)   Collection Time: 07/19/21  4:35 PM   Specimen: BLOOD  Result Value Ref Range Status   Specimen Description BLOOD RIGHT ANTECUBITAL  Final   Special Requests   Final    BOTTLES DRAWN AEROBIC ONLY Blood Culture results may not be optimal due to an inadequate volume of blood received in culture bottles   Culture   Final    NO GROWTH 2 DAYS Performed at [facility], [place] [address], [place], [place] [address]    Report Status PENDING  Incomplete  Culture, blood (routine x 2)     Status: None (Preliminary result)   Collection Time: 07/19/21  4:48 PM   Specimen:  BLOOD  Result Value Ref Range Status   Specimen Description BLOOD RIGHT UPPER ARM  Final   Special Requests   Final    BOTTLES DRAWN AEROBIC ONLY Blood Culture results may not be optimal due to an inadequate volume of blood received in culture bottles   Culture   Final    NO GROWTH 2 DAYS Performed at [facility], [place] [address], [place], [place] [address]    Report Status PENDING  Incomplete      Studies: No results found.  Scheduled Meds:  (feeding supplement) PROSource Plus  30 mL Oral BID BM   aspirin EC  81 mg Oral Daily   atorvastatin  10 mg Oral q1800   Chlorhexidine Gluconate Cloth  6 each Topical Q0600   darbepoetin (ARANESP) injection - DIALYSIS  40 mcg Intravenous Q Wed-HD   [START ON 07/23/2021] doxercalciferol  4 mcg Intravenous Q M,W,F-HD   ferric citrate  630 mg Oral TID WC   insulin aspart  0-6 Units Subcutaneous Q4H   pantoprazole  40 mg Oral Daily   sodium chloride flush  10-40 mL Intracatheter Q12H    Continuous Infusions:  sodium chloride 250 mL (07/19/21 0336)    ceFAZolin (ANCEF) IV 1 g (07/20/21 1820)   heparin Stopped (07/21/21 0826)     LOS: 5 days     Cristal Deer, MD [facility]  To reach me or the doctor on call, go to: [email or web address] Password [facility]  07/21/2021, 9:18 AM

## 2021-07-21 NOTE — Progress Notes (Signed)
[place] for IV Heparin Indication: Hx of DVT  Labs: Recent Labs    07/18/21 2345 07/20/21 0149 07/20/21 1046 07/20/21 1932 07/21/21 0120 07/21/21 2220  HGB 9.8* 9.3*  --   --  8.9*  --   HCT 30.0* 28.8*  --   --  27.7*  --   PLT 129* 160  --   --  206  --   HEPARINUNFRC 0.49 <0.10*   < > 0.63 0.37 0.37  CREATININE 5.80* 8.09*  --   --  9.47*  --    < > = values in this interval not displayed.   Medications:  Apixaban 2.5 mg BID PTA  Assessment: 65 yr old woman with ESRD (on MWF HD) presented to ED critically ill due to MSSA bacteremia and septic shock. Patient has hx of VTE (05/01/21) and was on apixaban 2.5 mg PO BID PTA (last dose unknown, but within past week per patient). Heparin level and PTT correlating.  Heparin level remains therapeutic 0.37 units/ml  Goal of Therapy:  Heparin level 0.3 -0.7 units/ml Monitor platelets by anticoagulation protocol: Yes   Plan:  Continue heparin at 1700 units/hr F/u am labs  Thank you for allowing pharmacy to be a part of this patient's care. Kailyn Dubie,PharmD

## 2021-07-21 NOTE — Procedures (Signed)
Interventional Radiology Procedure Note  Procedure: Tunneled left IJ HD catheter placement  Complications: None  Estimated Blood Loss: < 10 mL  Findings: 28 cm Palindrome catheter via left IJ vein. Tip in RA. OK to use.  Venetia Night. Kathlene Cote, M.D Pager:  [phone]

## 2021-07-21 NOTE — Progress Notes (Signed)
    [facility] for Infectious Disease   Reason for visit: Follow up on bacteremia  Interval History: repeat blood cultures remain negative.  Tunneled catheter placed today   Physical Exam: Constitutional:  Vitals:   07/21/21 1115 07/21/21 1120  BP: (!) 156/71 (!) 156/71  Pulse: 85 80  Resp: 13 14  Temp:    SpO2: 95% 93%   patient appears in NAD  Impression: bcteremia.  Plan for 6 weeks from 7/21-8/31/22 with dialysis.   Plan: 1. Cefazolin with dialysis through 08/29/21.  Follow up arranged with Dr. Gale Journey 08/08/21  Will sign off, thanks for consultation

## 2021-07-21 NOTE — Plan of Care (Signed)
  Problem: Health Behavior/Discharge Planning: Goal: Ability to manage health-related needs will improve Outcome: Progressing   Problem: Education: Goal: Knowledge of disease and its progression will improve Outcome: Progressing   Problem: Fluid Volume: Goal: Compliance with measures to maintain balanced fluid volume will improve Outcome: Progressing

## 2021-07-21 NOTE — Progress Notes (Addendum)
[facility] Progress Note   Subjective:  Seen in room. C/o pains in the left forearm and hand, no change.   Objective Vitals:   07/20/21 0541 07/20/21 0943 07/20/21 2147 07/21/21 0541  BP: 129/60 (!) 124/93 (!) 147/86 (!) 147/84  Pulse: 82 91 86 85  Resp: 16 18 20 17   Temp: 98 F (36.7 C) 98.2 F (36.8 C) 97.9 F (36.6 C) 98.4 F (36.9 C)  TempSrc: Oral Oral Oral Oral  SpO2: 90% 91% 92% 94%  Weight:    97.3 kg  Height:       Physical Exam General: Well appearing woman, NAD. Room air. Heart: RRR; no murmur Lungs: CTAB Abdomen: soft, non-tender Extremities: No LE edema Dialysis Access: none at this time Additional Objective Labs: Basic Metabolic Panel: Recent Labs  Lab 07/17/21 0408 07/18/21 1430 07/18/21 2345 07/20/21 0149 07/21/21 0120  NA 131* 133* 131* 136 133*  K 4.9 4.5 3.5 3.8 3.6  CL 101 104 98 102 101  CO2 15* 14* 21* 21* 17*  GLUCOSE 131* 113* 138* 121* 126*  BUN 30* 50* 23 34* 42*  CREATININE 7.20* 8.91* 5.80* 8.09* 9.47*  CALCIUM 7.3* 7.0* 6.5* 6.9* 7.0*  PHOS 6.5* 5.9*  --   --   --     Liver Function Tests: Recent Labs  Lab 07/16/21 1402 07/18/21 1430  AST 39  --   ALT 21  --   ALKPHOS 93  --   BILITOT 1.2  --   PROT 7.8  --   ALBUMIN 2.7* 2.0*     CBC: Recent Labs  Lab 07/16/21 1402 07/17/21 0408 07/18/21 0341 07/18/21 2345 07/20/21 0149 07/21/21 0120  WBC 26.9* 29.1* 20.4* 14.3* 13.2* 15.5*  NEUTROABS 23.8*  --   --   --   --   --   HGB 11.9* 10.7* 8.1* 9.8* 9.3* 8.9*  HCT 37.8 34.8* 25.6* 30.0* 28.8* 27.7*  MCV 92.2 94.3 92.4 89.8 91.1 90.8  PLT 143* 146* 159 129* 160 206    Blood Culture    Component Value Date/Time   SDES BLOOD RIGHT UPPER ARM 07/19/2021 1648   SPECREQUEST  07/19/2021 1648    BOTTLES DRAWN AEROBIC ONLY Blood Culture results may not be optimal due to an inadequate volume of blood received in culture bottles   CULT  07/19/2021 1648    NO GROWTH 2 DAYS Performed at [facility], [address], [place], [place] [address]    REPTSTATUS PENDING 07/19/2021 1648   Studies/Results: IR Removal Tun Cv Cath W/O FL  Result Date: 07/19/2021 INDICATION: Patient history of end-stage disease on hemodialysis via tunneled right HD catheter with MSSA bacteremia, request to IR for of tunneled HD catheter line holiday. EXAM: REMOVAL OF TUNNELED HEMODIALYSIS CATHETER MEDICATIONS: 7 mL 1% lidocaine 50 mcg fentanyl 1.5 mg midazolam COMPLICATIONS: None immediate. PROCEDURE: Informed written consent was obtained from the patient following an explanation of the procedure, risks, benefits and alternatives to treatment. A time out was performed prior to the initiation of the procedure. Maximal barrier sterile technique was utilized including caps, mask, sterile gowns, sterile gloves, large sterile drape, hand hygiene, and ChloraPrep. Heparin was removed from ports. There was no suture in place. 1% lidocaine was injected under sterile conditions along the subcutaneous tunnel. Utilizing a combination of blunt dissection and gentle traction, the catheter was removed intact. Hemostasis was obtained with manual compression. A dressing was placed. The patient tolerated the procedure well without immediate post procedural  complication. IMPRESSION: Successful removal of tunneled dialysis catheter. Read by Candiss Norse, PA-C Electronically Signed   By: Ruthann Cancer MD   On: 07/19/2021 14:14    Medications:  sodium chloride 250 mL (07/19/21 0336)    ceFAZolin (ANCEF) IV 1 g (07/20/21 1820)   heparin Stopped (07/21/21 0826)    (feeding supplement) PROSource Plus  30 mL Oral BID BM   aspirin EC  81 mg Oral Daily   atorvastatin  10 mg Oral q1800   Chlorhexidine Gluconate Cloth  6 each Topical Q0600   darbepoetin (ARANESP) injection - DIALYSIS  40 mcg Intravenous Q Wed-HD   [START ON 07/23/2021] doxercalciferol  4 mcg Intravenous Q M,W,F-HD   ferric citrate  630 mg Oral TID WC   insulin aspart   0-6 Units Subcutaneous Q4H   pantoprazole  40 mg Oral Daily   sodium chloride flush  10-40 mL Intracatheter Q12H    Dialysis: MWF at [facility] 3.5h    400/A1.5    90.5kg    2/2.5Ca     P2    TDC   Heparin 6000 - Hectoral 58mcg IV q HD - No ESA, Hgb > 11   Assessment/Plan:  Sepsis/MSSA bacteremia: Repeat Cx 7/19, 7/20 negative -> on Cefazolin. CXR clear. Currently off of pressors. LS MRI negative for diskitis (chronic DDD). Per ID, holding off on TEE given marked improvement. [place] removed 7/21 for line holiday. Plan new line today per IR.  F/U blood cx's from 7/21 are negative and pt is afebrile x 4 days here.   ESRD: Usual MWF schedule, dialyzed 7/20 and then [facility] removed 7/21 - plan to replace line today per IR (7/23) followed by dialysis.  Hypotension/volume: Initially required pressors at this time, now off. Home BP meds on hold. Still above EDW, will attempted standing weight for accuracy.  Anemia: Hgb 9.3, continue Aranesp q Wed. Metabolic bone disease: CorrCa and Phos ok, continue home binders/ VDRA.  Nutrition: Alb low, continue supplements.  Hx DVT/PE: Eliquis on hold, on hep drip for now    Kelly Splinter, MD 07/21/2021, 9:47 AM

## 2021-07-21 NOTE — Progress Notes (Addendum)
[place] for IV Heparin Indication: Hx of DVT  Patient Measurements: Height: 5\' 1"  (154.9 cm) Weight: 97.3 kg (214 lb 9.6 oz) IBW/kg (Calculated) : 47.8 Heparin Dosing Weight: 69 kg   Vital Signs: Temp: 98.4 F (36.9 C) (07/23 0541) Temp Source: Oral (07/23 0541) BP: 147/84 (07/23 0541) Pulse Rate: 85 (07/23 0541)  Labs: Recent Labs    07/18/21 2345 07/20/21 0149 07/20/21 1046 07/20/21 1932 07/21/21 0120  HGB 9.8* 9.3*  --   --  8.9*  HCT 30.0* 28.8*  --   --  27.7*  PLT 129* 160  --   --  206  HEPARINUNFRC 0.49 <0.10* 0.43 0.63 0.37  CREATININE 5.80* 8.09*  --   --  9.47*     Estimated Creatinine Clearance: 6.2 mL/min (A) (by C-G formula based on SCr of 9.47 mg/dL (H)).  Medications:  Apixaban 2.5 mg BID PTA  Assessment: 65 yr old woman with ESRD (on MWF HD) presented to ED critically ill due to MSSA bacteremia and septic shock. Patient has hx of VTE (05/01/21) and was on apixaban 2.5 mg PO BID PTA (last dose unknown, but within past week per patient). Heparin level and PTT correlating.  Heparin level this morning is therapeutic at 0.37 on 1700 units/hr.  CBC stable.  Heparin was held starting ~0830 for plans to replace HD cath today.  Goal of Therapy:  Heparin level 0.3 -0.7 units/ml Monitor platelets by anticoagulation protocol: Yes   Plan:  -Heparin currently on hold per IR recommendations -F/u post-IR anticoagulation plans  Thank you for allowing pharmacy to be a part of this patient's care.  Dimple Nanas, PharmD 07/21/2021 8:36 AM  **Pharmacist phone directory can now be found on [email or web address] ([email or web address]).  Listed under [place].   ADDENDUM: -Per IR, okay to restart IV heparin at 13:30 today -Previously therapeutic at 1700 units/hr- will resume at that rate -F/u daily heparin level 7/24

## 2021-07-22 DIAGNOSIS — M5441 Lumbago with sciatica, right side: Secondary | ICD-10-CM

## 2021-07-22 DIAGNOSIS — I10 Essential (primary) hypertension: Secondary | ICD-10-CM

## 2021-07-22 DIAGNOSIS — I5032 Chronic diastolic (congestive) heart failure: Secondary | ICD-10-CM

## 2021-07-22 LAB — CBC
HCT: 32.5 % — ABNORMAL LOW (ref 36.0–46.0)
Hemoglobin: 10.2 g/dL — ABNORMAL LOW (ref 12.0–15.0)
MCH: 28.3 pg (ref 26.0–34.0)
MCHC: 31.4 g/dL (ref 30.0–36.0)
MCV: 90.3 fL (ref 80.0–100.0)
Platelets: 184 10*3/uL (ref 150–400)
RBC: 3.6 MIL/uL — ABNORMAL LOW (ref 3.87–5.11)
RDW: 15.8 % — ABNORMAL HIGH (ref 11.5–15.5)
WBC: 19.9 10*3/uL — ABNORMAL HIGH (ref 4.0–10.5)
nRBC: 0.1 % (ref 0.0–0.2)

## 2021-07-22 LAB — CULTURE, BLOOD (ROUTINE X 2)
Culture: NO GROWTH
Culture: NO GROWTH
Special Requests: ADEQUATE
Special Requests: ADEQUATE

## 2021-07-22 LAB — BASIC METABOLIC PANEL
Anion gap: 15 (ref 5–15)
BUN: 18 mg/dL (ref 8–23)
CO2: 22 mmol/L (ref 22–32)
Calcium: 7.8 mg/dL — ABNORMAL LOW (ref 8.9–10.3)
Chloride: 97 mmol/L — ABNORMAL LOW (ref 98–111)
Creatinine, Ser: 6.33 mg/dL — ABNORMAL HIGH (ref 0.44–1.00)
GFR, Estimated: 7 mL/min — ABNORMAL LOW (ref >60)
Glucose, Bld: 78 mg/dL (ref 70–99)
Potassium: 4 mmol/L (ref 3.5–5.1)
Sodium: 134 mmol/L — ABNORMAL LOW (ref 135–145)

## 2021-07-22 LAB — HEPARIN LEVEL (UNFRACTIONATED): Heparin Unfractionated: 0.37 IU/mL (ref 0.30–0.70)

## 2021-07-22 MED ORDER — MELATONIN 3 MG PO TABS
3.0000 mg | ORAL_TABLET | Freq: Every evening | ORAL | 0 refills | Status: DC | PRN
Start: 2021-07-22 — End: 2021-08-30

## 2021-07-22 MED ORDER — ASPIRIN 81 MG PO TBEC: 81.0000 mg | DELAYED_RELEASE_TABLET | Freq: Every day | ORAL | 11 refills | Status: DC

## 2021-07-22 MED ORDER — POLYETHYLENE GLYCOL 3350 17 G PO PACK: 17.0000 g | PACK | Freq: Every day | ORAL | 0 refills | Status: DC | PRN

## 2021-07-22 MED ORDER — CEFAZOLIN IV (FOR PTA / DISCHARGE USE ONLY): 2.0000 g | INTRAVENOUS | 0 refills | Status: DC

## 2021-07-22 MED ORDER — LIDOCAINE 5 % EX PTCH
1.0000 | MEDICATED_PATCH | Freq: Every day | CUTANEOUS | 0 refills | Status: DC | PRN
Start: 2021-07-22 — End: 2021-08-09

## 2021-07-22 MED ORDER — DOCUSATE SODIUM 100 MG PO CAPS: 100.0000 mg | ORAL_CAPSULE | Freq: Two times a day (BID) | ORAL | 0 refills | Status: DC | PRN

## 2021-07-22 MED ORDER — PROSOURCE PLUS PO LIQD: 30.0000 mL | Freq: Two times a day (BID) | ORAL | 0 refills | Status: DC

## 2021-07-22 NOTE — Discharge Summary (Signed)
Discharge Summary  Whitney Walker KNL:[id number] DOB: [birth year]  PCP: [name], FNP  Admit date: 07/16/2021 Discharge date: 07/22/2021  Time spent: > 31  Recommendations for Outpatient Follow-up:   pcp Impression: bcteremia.  Plan for 6 weeks from 7/21-8/31/22 with dialysis.   Plan: 1. Cefazolin with dialysis through 08/29/21.   Follow up arranged with Dr. Gale Journey 08/08/21   Discharge Diagnoses:  Active Hospital Problems   Diagnosis Date Noted   Acute low back pain    MSSA bacteremia    Septic shock ([place]) 07/16/2021   ESRD (end stage renal disease) ([place]) 04/29/2018   Morbid (severe) obesity due to excess calories ([place]) 03/12/2017   Essential hypertension 11/15/2013   Chronic diastolic heart failure ([place]) 11/15/2013    Resolved Hospital Problems  No resolved problems to display.    Discharge Condition: Improved  Diet recommendation: Renal/cardiac  Vitals:   07/22/21 0550 07/22/21 1021  BP: (!) 106/52 134/80  Pulse: 78 80  Resp: 19 18  Temp: 97.7 F (36.5 C) 98.1 F (36.7 C)  SpO2: 92% (!) 89%    History of present illness:  This is a 65 year old female with past medical history significant for end-stage renal disease on hemodialysis who presented to the emergency department on July 16, 2021 for gradual onset of constant aching in her low back nausea vomiting and diarrhea with fever 100 and hypertensive originally admitted to [facility] and given some pressors.  She was weaned off pressors on June July 19 she was also on vancomycin and cefepime She was found to have MSSA bacteremia.  Source thought to be hemodialysis catheter as a result of the hemodialysis catheter was pulled on July 19, 2021 pending cultures.  However dialysis catheter was Was replaced July 21, 2021 and she received her hemodialysis on July 21, 2021.  Patient is to continue her antibiotics for 6 weeks with ending date of August 29, 2021 Cefazolin with dialysis through 08/29/21.  Hospital Course:   Active Problems:   Chronic diastolic heart failure (HCC)   Essential hypertension   Morbid (severe) obesity due to excess calories (HCC)   ESRD (end stage renal disease) ([place])   Septic shock (HCC)   MSSA bacteremia   Acute low back pain  admitted to [facility] and given some pressors.  She was weaned off pressors on June July 19 she was also on vancomycin and cefepime She was found to have MSSA bacteremia.  Source thought to be hemodialysis catheter as a result of the hemodialysis catheter was pulled on July 19, 2021 pending cultures.  However dialysis catheter was Was replaced July 21, 2021 and she received her hemodialysis on July 21, 2021.  Patient is to continue her antibiotics for 6 weeks with ending date of August 29, 2021 Cefazolin with dialysis through 08/29/21.  #1 end-stage renal disease on hemodialysis Monday Wednesday and Friday  2.  Septic shock secondary to MSSA bacteremia, POA Secondary to [facility] for hemodialysis ID consulted Patient is currently on cefazolin which she will be on for 6 weeks with dialysis on dialysis days  3.  History of DVT and PE Eliquis was on hold and was put on heparin drip Restart eliqius  4.  Hypertension hyperlipidemia peripheral vascular disease/coronary artery disease Continue atorvastatin Amlodipine and carvedilol on hold  5.  Right upper lobe and left lower lobe pulmonary nodules: Stable Follow-up CT in 12 to 18 months Consultants: Nephrology Infectious disease IR PCCM   Procedures: Temporary cath placement tunneled left IJ hemodialysis catheter placement  07/21/2021 Hemodialysis   Discharge Exam: BP 134/80 (BP Location: Right Arm)   Pulse 80   Temp 98.1 F (36.7 C) (Oral)   Resp 18   Ht _0  (1.549 m)   Wt 94.5 kg   SpO2 (!) 89%   BMI 39.36 kg/m   General: sitting comfortably without oxygen Cardiovascular: Regular rate and rhythm Respiratory: No distress basal rales noted No edema  Discharge Instructions You were cared for  by a hospitalist during your hospital stay. If you have any questions about your discharge medications or the care you received while you were in the hospital after you are discharged, you can call the unit and asked to speak with the hospitalist on call if the hospitalist that took care of you is not available. Once you are discharged, your primary care physician will handle any further medical issues. Please note that NO REFILLS for any discharge medications will be authorized once you are discharged, as it is imperative that you return to your primary care physician (or establish a relationship with a primary care physician if you do not have one) for your aftercare needs so that they can reassess your need for medications and monitor your lab values.  Discharge Instructions     [facility] pharmacist to adjust dose for Vancomycin, Aminoglycosides and other anti-infective therapies as requested by physician.   Complete by: As directed    [facility] to provide Cath Flo 79m   Complete by: As directed    Administer for PICC line occlusion and as ordered by physician for other access device issues.   Anaphylaxis Kit: Provided to treat any anaphylactic reaction to the medication being provided to the patient if First Dose or when requested by physician   Complete by: As directed    Epinephrine 150mml vial / amp: Administer 0.2m38m0.2ml47mubcutaneously once for moderate to severe anaphylaxis, nurse to call physician and pharmacy when reaction occurs and call 911 if needed for immediate care   Diphenhydramine 50mg32mIV vial: Administer 25-50mg 62mM PRN for first dose reaction, rash, itching, mild reaction, nurse to call physician and pharmacy when reaction occurs   Sodium Chloride 0.9% NS 500ml I36mdminister if needed for hypovolemic blood pressure drop or as ordered by physician after call to physician with anaphylactic reaction   Call MD for:  difficulty breathing, headache or  visual disturbances   Complete by: As directed    Call MD for:  temperature >100.4   Complete by: As directed    Change dressing on IV access line weekly and PRN   Complete by: As directed    Diet - low sodium heart healthy   Complete by: As directed    Discharge instructions   Complete by: As directed    F/u PCP 1 week HD m/w/f/ Impression: bcteremia.  Plan for 6 weeks from 7/21-8/31/22 with dialysis.   Plan: 1. Cefazolin with dialysis through 08/29/21.   Follow up arranged with Dr. Vu 8/10Gale Journey2   Discharge wound care:   Complete by: As directed    Per nursing   Flush IV access with Sodium Chloride 0.9% and Heparin 10 units/ml or 100 units/ml   Complete by: As directed    Home infusion instructions - [facility]   Complete by: As directed    Instructions: Flush IV access with Sodium Chloride 0.9% and Heparin 10units/ml or 100units/ml   Change dressing on IV access line: Weekly and PRN   Instructions Cath Flo 2mg:55m  Administer for PICC Line occlusion and as ordered by physician for other access device   [facility] pharmacist to adjust dose for: Vancomycin, Aminoglycosides and other anti-infective therapies as requested by physician   Increase activity slowly   Complete by: As directed    Method of administration may be changed at the discretion of home infusion pharmacist based upon assessment of the patient and/or caregiver's ability to self-administer the medication ordered   Complete by: As directed    Outpatient Parenteral Antibiotic Therapy Information Antibiotic: Cefazolin (Ancef) IVPB; Indications for use: bacteremia; End Date: 08/29/2021   Complete by: As directed    Antibiotic: Cefazolin (Ancef) IVPB   Indications for use: bacteremia   End Date: 08/29/2021      Allergies as of 07/22/2021       Reactions   Carnosine    Other reaction(s): Unknown   Gadolinium Derivatives Hives, Other (See Comments)   HIVES, Desc: HIVES W/ "DYE" USED FOR 1ST CT SCAN  BUT NOT 2ND, NO PREMEDS USED, PT UNCERTAIN OF CIRCUMSTANCES,,?POSSIBLE MRI CONTRAST ALLERGY, ALL STUDIES DONE "SOMEWHERE" IN [place]//A.C., Onset Date: [id number]   Iohexol Other (See Comments)    Code: HIVES, Desc: HIVES W/ "DYE" USED FOR 1ST CT SCAN BUT NOT 2ND, NO PREMEDS USED, PT UNCERTAIN OF CIRCUMSTANCES,,?POSSIBLE MRI CONTRAST ALLERGY, ALL STUDIES DONE "SOMEWHERE" IN [place]//A.C., Onset Date: [id number]   Iodine Hives   Naltrexone    Other reaction(s): Unknown        Medication List     STOP taking these medications    amLODipine 5 MG tablet Commonly known as: NORVASC   carvedilol 12.5 MG tablet Commonly known as: COREG   nystatin powder Commonly known as: nystatin   predniSONE 10 MG tablet Commonly known as: DELTASONE       TAKE these medications    (feeding supplement) PROSource Plus liquid Take 30 mLs by mouth 2 (two) times daily between meals.   acetaminophen 500 MG tablet Commonly known as: TYLENOL Take 1,000 mg by mouth 2 (two) times daily as needed for moderate pain or headache.   albuterol 108 (90 Base) MCG/ACT inhaler Commonly known as: VENTOLIN HFA Inhale 2 puffs into the lungs every 6 (six) hours as needed for wheezing or shortness of breath.   apixaban 2.5 MG Tabs tablet Commonly known as: Eliquis Take 1 tablet (2.5 mg total) by mouth 2 (two) times daily.   aspirin 81 MG EC tablet Take 1 tablet (81 mg total) by mouth daily. Swallow whole. Start taking on: July 23, 2021   atorvastatin 10 MG tablet Commonly known as: Lipitor Take 1 tablet (10 mg total) by mouth daily.   Auryxia 1 GM 210 MG(Fe) tablet Generic drug: ferric citrate Take 420-840 mg by mouth with breakfast, with lunch, and with evening meal. Taking  2 tablets daily (417m) with each snack   Biotin 10000 MCG Tabs Take 10,000 mcg by mouth daily.   ceFAZolin  IVPB Commonly known as: ANCEF Inject 2 g into the vein every Monday, Wednesday, and Friday with hemodialysis.  Indication:  bacteremia First Dose: No Last Day of Therapy:  08/29/21 Labs - Once weekly:  CBC/D and BMP, Labs - Every other week:  ESR and CRP Method of administration: IV Push Method of administration may be changed at the discretion of home infusion pharmacist based upon assessment of the patient and/or caregiver's ability to self-administer the medication ordered. Start taking on: July 23, 2021   docusate sodium 100 MG capsule Commonly known as:  COLACE Take 1 capsule (100 mg total) by mouth 2 (two) times daily as needed for mild constipation.   lidocaine 5 % Commonly known as: LIDODERM Place 1 patch onto the skin daily as needed. Remove & Discard patch within 12 hours or as directed by MD   meclizine 12.5 MG tablet Commonly known as: ANTIVERT Take 1 tablet (12.5 mg total) by mouth 3 (three) times daily as needed for dizziness.   melatonin 3 MG Tabs tablet Take 1 tablet (3 mg total) by mouth at bedtime as needed.   multivitamin Tabs tablet Take 1 tablet by mouth at bedtime.   mupirocin ointment 2 % Commonly known as: BACTROBAN Apply 1 application topically daily as needed (skin bumps).   omeprazole 20 MG capsule Commonly known as: PRILOSEC Take 1 capsule (20 mg total) by mouth daily.   polyethylene glycol 17 g packet Commonly known as: MIRALAX / GLYCOLAX Take 17 g by mouth daily as needed for moderate constipation.   vitamin C 100 MG tablet Take 100 mg by mouth daily.   vitamin E 200 UNIT capsule Take 200 Units by mouth daily.               Durable Medical Equipment  (From admission, onward)           Start     Ordered   07/22/21 1037  For home use only DME 3 n 1  Once        07/22/21 1036   07/22/21 1033  DME 3-in-1  Once        07/22/21 1039              Discharge Care Instructions  (From admission, onward)           Start     Ordered   07/22/21 0000  Change dressing on IV access line weekly and PRN  (Home infusion instructions -  [facility] )        07/22/21 1041   07/22/21 0000  Discharge wound care:       Comments: Per nursing   07/22/21 1039           Allergies  Allergen Reactions   Carnosine     Other reaction(s): Unknown   Gadolinium Derivatives Hives and Other (See Comments)    HIVES, Desc: HIVES W/ "DYE" USED FOR 1ST CT SCAN BUT NOT 2ND, NO PREMEDS USED, PT UNCERTAIN OF CIRCUMSTANCES,,?POSSIBLE MRI CONTRAST ALLERGY, ALL STUDIES DONE "SOMEWHERE" IN [place]//A.C., Onset Date: [id number]   Iohexol Other (See Comments)     Code: HIVES, Desc: HIVES W/ "DYE" USED FOR 1ST CT SCAN BUT NOT 2ND, NO PREMEDS USED, PT UNCERTAIN OF CIRCUMSTANCES,,?POSSIBLE MRI CONTRAST ALLERGY, ALL STUDIES DONE "SOMEWHERE" IN [place]//A.C., Onset Date: [id number]    Iodine Hives   Naltrexone     Other reaction(s): Unknown     Follow-up Information     [facility] [place] Follow up.   Specialty: [place] Why: the office will call to schedule home health physical therapy visits Contact information: [address] [place] [address] [phone]                 The results of significant diagnostics from this hospitalization (including imaging, microbiology, ancillary and laboratory) are listed below for reference.    Significant Diagnostic Studies: MR LUMBAR SPINE WO CONTRAST  Result Date: 07/18/2021 CLINICAL DATA:  Epidural abscess suspected. Additional provided: Bilateral flank pain, worsening for several weeks, dialysis patient,  patient unable to walk due to pain. EXAM: MRI LUMBAR SPINE WITHOUT CONTRAST TECHNIQUE: Multiplanar, multisequence MR imaging of the lumbar spine was performed. No intravenous contrast was administered. COMPARISON:  Radiographs of the lumbar spine 05/08/2020. FINDINGS: Segmentation: 5 lumbar vertebrae. The caudal most well-formed intervertebral disc space is designated L5-S1. Alignment:  Trace T11-T12, L1-L2 and L3-L4 grade 1 retrolisthesis.  Vertebrae: There are Schmorl nodes within the T12 inferior endplate, L1 inferior endplate, L3 superior endplate and L3 inferior endplate. There is minimal marrow edema surrounding the Schmorl node within the T12 inferior endplate, and this Schmorl node may be acute or subacute. Trace multilevel endplate edema elsewhere, which appear degenerative. Mild edema within the right L5 articular pillar also appears degenerative (series 3, image 5). No specific findings to suggest discitis/osteomyelitis. Conus medullaris and cauda equina: Conus extends to the L1-L2 level. No signal abnormality within the visualized distal spinal cord. No epidural abscess is identified on this noncontrast examination. Paraspinal and other soft tissues: Atrophic kidneys. Atrophy of the lumbar paraspinal musculature. Additionally, there is nonspecific edema signal within the subcutaneous fat overlying the thoracolumbar spine bilaterally. Disc levels: Mild disc degeneration throughout the lumbar and visualized lower thoracic spine. T11-T12: Imaged sagittally. Trace grade 1 retrolisthesis. Small disc bulge. Mild partial effacement of the ventral thecal sac without spinal cord mass effect. No appreciable significant foraminal stenosis. T12-L1: Imaged sagittally. Minimal disc bulge. No significant spinal canal or foraminal stenosis. L1-L2: Trace grade 1 retrolisthesis. No significant disc herniation or stenosis. L2-L3: Shallow left subarticular/foraminal disc protrusion. Minimal left subarticular narrowing without appreciable nerve root impingement. Central canal patent. No significant foraminal stenosis. L3-L4: Trace grade 1 retrolisthesis. Disc bulge. Superimposed broad-based central disc protrusion eccentric to the right. Mild facet arthrosis/ligamentum flavum hypertrophy. Trace right facet joint effusion. Mild bilateral subarticular and central canal narrowing without frank nerve root impingement. Mild relative bilateral neural foraminal  narrowing. L4-L5: Disc bulge. Superimposed broad-based central disc protrusion at site of posterior annular fissure, eccentric to the right. Mild facet arthrosis/ligamentum flavum hypertrophy. Trace right facet joint effusion. The disc protrusion contributes to bilateral subarticular narrowing (mild/moderate right, mild left), contacting and crowding the descending right L5 nerve root (series 5, image 23). Mild relative narrowing of the central canal. Bilateral neural foraminal narrowing (mild/moderate right, mild left). L5-S1: Disc bulge with endplate spurring. Superimposed broad-based central disc protrusion at site of posterior annular fissure. Mild facet arthrosis. The disc protrusion contributes to bilateral subarticular stenosis (moderate right, mild left). The disc protrusion contacts and crowds the descending right S1 nerve root. The disc protrusion may also contact the descending left S1 nerve root. Mild relative narrowing of the central canal. No significant foraminal stenosis. IMPRESSION: No specific findings to suggest discitis/osteomyelitis at this time. No evidence of epidural abscess on this non-contrast study. Schmorl nodes within the T12 inferior endplate, L1 inferior endplate, L3 superior endplate and L3 inferior endplate. There is mild marrow edema surrounding the Schmorl node within the T12 inferior endplate, and this Schmorl node may be acute/subacute. Minimal edema elsewhere, as described and degenerative in appearance. Lumbar spondylosis, as outlined and with findings most notably as follows. At L4-L5, there is a broad-based central disc protrusion at site of posterior annular fissure. This contributes to multifactorial bilateral subarticular stenosis (mild/moderate right, mild left), contacting and crowding the descending right L5 nerve root. Mild central canal narrowing. Bilateral neural foraminal narrowing (mild/moderate right, mild left). Trace right facet joint effusion. At L5-S1, a  broad-based central disc protrusion at site of posterior  annular fissure contributes to multifactorial bilateral subarticular stenosis (moderate right, mild left). The disc protrusion contacts and crowds the descending right S1 nerve root. The disc protrusion may also contact the descending left S1 nerve root. Mild relative narrowing of the central canal. No more than mild spinal canal or neural foraminal narrowing at the remaining levels. Trace right facet joint effusion also present at L3-L4. Electronically Signed   By: Kellie Simmering DO   On: 07/18/2021 14:05   US Abdomen Complete  Result Date: 07/17/2021 CLINICAL DATA:  Septic shock EXAM: ABDOMEN ULTRASOUND COMPLETE COMPARISON:  CT 07/16/2021 FINDINGS: Gallbladder: Absent Common bile duct: Diameter: 5 mm in proximal diameter Liver: No focal lesion identified. Within normal limits in parenchymal echogenicity. Portal vein is patent on color Doppler imaging with normal direction of blood flow towards the liver. IVC: No abnormality visualized. Pancreas: Visualized portion unremarkable. Spleen: Size and appearance within normal limits. Right Kidney: Length: 7.8 cm. Renal cortical echogenicity is within normal limits. Mild to moderate renal cortical atrophy is noted. The 2 cortical lesions in question noted on prior CT examination represents simple cysts measuring 2.9 and 2.5 cm in greatest dimension. No solid intrarenal masses are seen. No intrarenal calcifications are seen. There is no hydronephrosis. Left Kidney: Length: 7.7 cm. Renal cortical echogenicity is within normal limits. Mild to moderate cortical atrophy is noted. No hydronephrosis. No intrarenal masses or calcifications are seen. Abdominal aorta: No aneurysm visualized. Other findings: None. IMPRESSION: Status post cholecystectomy. Mild to moderate bilateral renal cortical atrophy. Indeterminate cortical lesions noted within the right kidney on prior CT examination represents simple cortical cyst.  Further follow-up is not required. Electronically Signed   By: Fidela Salisbury MD   On: 07/17/2021 03:09   IR Fluoro Guide CV Line Right  Result Date: 07/21/2021 CLINICAL DATA:  Insert renal disease and status post removal of tunneled right jugular hemodialysis catheter on 07/19/2021 due to bacteremia. Bacteremia has been treated and the patient now requires placement of a new tunneled dialysis catheter for continued hemodialysis. EXAM: TUNNELED CENTRAL VENOUS HEMODIALYSIS CATHETER PLACEMENT WITH ULTRASOUND AND FLUOROSCOPIC GUIDANCE ANESTHESIA/SEDATION: 1.5 mg IV Versed; 50 mcg IV Fentanyl. Total Moderate Sedation Time:   25 minutes. The patient's level of consciousness and physiologic status were continuously monitored during the procedure by Radiology nursing. MEDICATIONS: No additional medications. FLUOROSCOPY TIME:  2 minutes and 36 seconds.  18.0 mGy. PROCEDURE: The procedure, risks, benefits, and alternatives were explained to the patient. Questions regarding the procedure were encouraged and answered. The patient understands and consents to the procedure. A timeout was performed prior to initiating the procedure. The left neck and chest were prepped with chlorhexidine in a sterile fashion, and a sterile drape was applied covering the operative field. Maximum barrier sterile technique with sterile gowns and gloves were used for the procedure. Local anesthesia was provided with 1% lidocaine. Ultrasound was used to confirm patency of the left internal jugular vein. After creating a small venotomy incision, a 21 gauge needle was advanced into the left internal jugular vein under direct, real-time ultrasound guidance. Ultrasound image documentation was performed. After securing guidewire access, an 8 Fr dilator was placed. A J-wire was kinked to measure appropriate catheter length. A Palindrome tunneled hemodialysis catheter measuring 28 cm from tip to cuff was chosen for placement. This was tunneled in a  retrograde fashion from the chest wall to the venotomy incision. At the venotomy, serial dilatation was performed and a 15 Fr peel-away sheath was placed over a guidewire.  The catheter was then placed through the sheath and the sheath removed. Final catheter positioning was confirmed and documented with a fluoroscopic spot image. The catheter was aspirated, flushed with saline, and injected with appropriate volume heparin dwells. The venotomy incision was closed with subcuticular 4-0 Vicryl. Dermabond was applied to the incision. The catheter exit site was secured with 0-Prolene retention sutures. COMPLICATIONS: None.  No pneumothorax. FINDINGS: Initial ultrasound demonstrates occlusive thrombus in the right internal jugular vein after removal of the recent tunneled catheter. The left internal jugular vein is normally patent. After new left-sided catheter placement, the tip lies in the right atrium. The catheter aspirates normally and is ready for immediate use. IMPRESSION: Placement of tunneled hemodialysis catheter via the left internal jugular vein. The catheter tip lies in the right atrium. The catheter is ready for immediate use. Electronically Signed   By: Aletta Edouard M.D.   On: 07/21/2021 12:28   IR Removal Tun Cv Cath W/O FL  Result Date: 07/19/2021 INDICATION: Patient history of end-stage disease on hemodialysis via tunneled right HD catheter with MSSA bacteremia, request to IR for of tunneled HD catheter line holiday. EXAM: REMOVAL OF TUNNELED HEMODIALYSIS CATHETER MEDICATIONS: 7 mL 1% lidocaine 50 mcg fentanyl 1.5 mg midazolam COMPLICATIONS: None immediate. PROCEDURE: Informed written consent was obtained from the patient following an explanation of the procedure, risks, benefits and alternatives to treatment. A time out was performed prior to the initiation of the procedure. Maximal barrier sterile technique was utilized including caps, mask, sterile gowns, sterile gloves, large sterile drape,  hand hygiene, and ChloraPrep. Heparin was removed from ports. There was no suture in place. 1% lidocaine was injected under sterile conditions along the subcutaneous tunnel. Utilizing a combination of blunt dissection and gentle traction, the catheter was removed intact. Hemostasis was obtained with manual compression. A dressing was placed. The patient tolerated the procedure well without immediate post procedural complication. IMPRESSION: Successful removal of tunneled dialysis catheter. Read by Candiss Norse, PA-C Electronically Signed   By: Ruthann Cancer MD   On: 07/19/2021 14:14   IR US Guide Vasc Access Right  Result Date: 07/21/2021 CLINICAL DATA:  Insert renal disease and status post removal of tunneled right jugular hemodialysis catheter on 07/19/2021 due to bacteremia. Bacteremia has been treated and the patient now requires placement of a new tunneled dialysis catheter for continued hemodialysis. EXAM: TUNNELED CENTRAL VENOUS HEMODIALYSIS CATHETER PLACEMENT WITH ULTRASOUND AND FLUOROSCOPIC GUIDANCE ANESTHESIA/SEDATION: 1.5 mg IV Versed; 50 mcg IV Fentanyl. Total Moderate Sedation Time:   25 minutes. The patient's level of consciousness and physiologic status were continuously monitored during the procedure by Radiology nursing. MEDICATIONS: No additional medications. FLUOROSCOPY TIME:  2 minutes and 36 seconds.  18.0 mGy. PROCEDURE: The procedure, risks, benefits, and alternatives were explained to the patient. Questions regarding the procedure were encouraged and answered. The patient understands and consents to the procedure. A timeout was performed prior to initiating the procedure. The left neck and chest were prepped with chlorhexidine in a sterile fashion, and a sterile drape was applied covering the operative field. Maximum barrier sterile technique with sterile gowns and gloves were used for the procedure. Local anesthesia was provided with 1% lidocaine. Ultrasound was used to confirm  patency of the left internal jugular vein. After creating a small venotomy incision, a 21 gauge needle was advanced into the left internal jugular vein under direct, real-time ultrasound guidance. Ultrasound image documentation was performed. After securing guidewire access, an 8 Fr dilator was placed. A  J-wire was kinked to measure appropriate catheter length. A Palindrome tunneled hemodialysis catheter measuring 28 cm from tip to cuff was chosen for placement. This was tunneled in a retrograde fashion from the chest wall to the venotomy incision. At the venotomy, serial dilatation was performed and a 15 Fr peel-away sheath was placed over a guidewire. The catheter was then placed through the sheath and the sheath removed. Final catheter positioning was confirmed and documented with a fluoroscopic spot image. The catheter was aspirated, flushed with saline, and injected with appropriate volume heparin dwells. The venotomy incision was closed with subcuticular 4-0 Vicryl. Dermabond was applied to the incision. The catheter exit site was secured with 0-Prolene retention sutures. COMPLICATIONS: None.  No pneumothorax. FINDINGS: Initial ultrasound demonstrates occlusive thrombus in the right internal jugular vein after removal of the recent tunneled catheter. The left internal jugular vein is normally patent. After new left-sided catheter placement, the tip lies in the right atrium. The catheter aspirates normally and is ready for immediate use. IMPRESSION: Placement of tunneled hemodialysis catheter via the left internal jugular vein. The catheter tip lies in the right atrium. The catheter is ready for immediate use. Electronically Signed   By: Aletta Edouard M.D.   On: 07/21/2021 12:28   DG Chest Port 1 View  Result Date: 07/17/2021 CLINICAL DATA:  Respiratory failure. EXAM: PORTABLE CHEST 1 VIEW COMPARISON:  CT 07/16/2021.  Chest x-ray 07/16/2021. FINDINGS: Dialysis catheter stable position. Cardiomegaly with  pulmonary venous congestion noted. Low lung volumes with mild bibasilar atelectasis. No focal infiltrate or edema. No prominent pleural effusion. No pneumothorax. Reference is made to recent chest CT report for discussion of pulmonary nodules present. No acute bony abnormality identified. IMPRESSION: 1.  Dialysis catheter stable position. 2. Cardiomegaly with pulmonary venous congestion. Low lung volumes with mild bibasilar atelectasis. No focal infiltrate or edema. Electronically Signed   By: Marcello Moores  Register   On: 07/17/2021 06:26   DG Chest Port 1 View  Result Date: 07/16/2021 CLINICAL DATA:  Questionable sepsis EXAM: PORTABLE CHEST 1 VIEW COMPARISON:  May 28, 2021 FINDINGS: Right internal jugular dual lumen tunneled central venous catheter in stable position. The heart size and mediastinal contours are within normal limits. Streaky bibasilar opacities. No visible pleural effusion or pneumothorax. The visualized skeletal structures are unremarkable. IMPRESSION: Streaky bibasilar opacities, favor atelectasis although developing infection would not be excluded. Electronically Signed   By: Dahlia Bailiff MD   On: 07/16/2021 13:59   ECHOCARDIOGRAM COMPLETE  Result Date: 07/17/2021    ECHOCARDIOGRAM REPORT   Patient Name:   [facility] Date of Exam: 07/17/2021 Medical Rec #:  [id number]            Height:       61.0 in Accession #:    [id number]           Weight:       207.2 lb Date of Birth:  01/06/1956             BSA:          1.917 m Patient Age:    35 years             BP:           137/58 mmHg Patient Gender: F                    HR:           89 bpm. Exam Location:  Inpatient Procedure: 2D  Echo, Cardiac Doppler and Color Doppler Indications:    Endocarditis  History:        Patient has prior history of Echocardiogram examinations, most                 recent 04/08/2021. CHF, CAD, COPD; Risk Factors:Hypertension and                 Diabetes.  Sonographer:    Bernadene Person RDCS Referring Phys:  [id number] Kipp Brood  Sonographer Comments: Image acquisition challenging due to uncooperative patient. IMPRESSIONS  1. Left ventricular ejection fraction, by estimation, is 70 to 75%. The left ventricle has hyperdynamic function. The left ventricle has no regional wall motion abnormalities. Left ventricular diastolic parameters are consistent with Grade I diastolic dysfunction (impaired relaxation). Elevated left atrial pressure.  2. Right ventricular systolic function is normal. The right ventricular size is normal. The estimated right ventricular systolic pressure is 45.3 mmHg.  3. Left atrial size was mildly dilated.  4. The mitral valve is normal in structure. No evidence of mitral valve regurgitation. Moderate mitral annular calcification.  5. The aortic valve is tricuspid. There is mild calcification of the aortic valve. There is mild thickening of the aortic valve. Aortic valve regurgitation is not visualized. Mild to moderate aortic valve sclerosis/calcification is present, without any evidence of aortic stenosis. Aortic valve mean gradient measures 14.0 mmHg. Aortic valve Vmax measures 2.52 m/s.  6. The inferior vena cava is normal in size with greater than 50% respiratory variability, suggesting right atrial pressure of 3 mmHg. Comparison(s): Prior images unable to be directly viewed, comparison made by report only. Conclusion(s)/Recommendation(s): No evidence of valvular vegetations on this transthoracic echocardiogram. Would recommend a transesophageal echocardiogram to exclude infective endocarditis if clinically indicated. FINDINGS  Left Ventricle: Left ventricular ejection fraction, by estimation, is 70 to 75%. The left ventricle has hyperdynamic function. The left ventricle has no regional wall motion abnormalities. The left ventricular internal cavity size was normal in size. There is no left ventricular hypertrophy. Left ventricular diastolic parameters are consistent with Grade I diastolic  dysfunction (impaired relaxation). Elevated left atrial pressure. Right Ventricle: The right ventricular size is normal. No increase in right ventricular wall thickness. Right ventricular systolic function is normal. The tricuspid regurgitant velocity is 3.15 m/s, and with an assumed right atrial pressure of 3 mmHg, the estimated right ventricular systolic pressure is 64.6 mmHg. Left Atrium: Left atrial size was mildly dilated. Right Atrium: Right atrial size was normal in size. Pericardium: There is no evidence of pericardial effusion. Mitral Valve: The mitral valve is normal in structure. Moderate mitral annular calcification. No evidence of mitral valve regurgitation. Tricuspid Valve: The tricuspid valve is normal in structure. Tricuspid valve regurgitation is trivial. Aortic Valve: Borderline criteria for aortic valve stenosis, gradients are exaggerated by hyperdynamic LV function. The aortic valve is tricuspid. There is mild calcification of the aortic valve. There is mild thickening of the aortic valve. Aortic valve  regurgitation is not visualized. Mild to moderate aortic valve sclerosis/calcification is present, without any evidence of aortic stenosis. Aortic valve mean gradient measures 14.0 mmHg. Aortic valve peak gradient measures 25.5 mmHg. Aortic valve area, by VTI measures 1.77 cm. Pulmonic Valve: The pulmonic valve was grossly normal. Pulmonic valve regurgitation is mild. Aorta: The aortic root is normal in size and structure. Venous: The inferior vena cava is normal in size with greater than 50% respiratory variability, suggesting right atrial pressure of 3 mmHg. IAS/Shunts: No atrial level shunt detected  by color flow Doppler.  LEFT VENTRICLE PLAX 2D LVIDd:         4.10 cm  Diastology LVIDs:         2.50 cm  LV e' medial:    7.13 cm/s LV PW:         1.10 cm  LV E/e' medial:  16.8 LV IVS:        1.10 cm  LV e' lateral:   6.60 cm/s LVOT diam:     2.10 cm  LV E/e' lateral: 18.2 LV SV:         90 LV SV  Index:   47 LVOT Area:     3.46 cm  RIGHT VENTRICLE RV S prime:     11.90 cm/s TAPSE (M-mode): 1.9 cm LEFT ATRIUM             Index       RIGHT ATRIUM          Index LA diam:        3.80 cm 1.98 cm/m  RA Area:     9.72 cm LA Vol (A2C):   49.5 ml 25.82 ml/m RA Volume:   16.80 ml 8.76 ml/m LA Vol (A4C):   41.2 ml 21.49 ml/m LA Biplane Vol: 46.5 ml 24.25 ml/m  AORTIC VALVE AV Area (Vmax):    1.96 cm AV Area (Vmean):   1.91 cm AV Area (VTI):     1.77 cm AV Vmax:           252.25 cm/s AV Vmean:          171.500 cm/s AV VTI:            0.510 m AV Peak Grad:      25.5 mmHg AV Mean Grad:      14.0 mmHg LVOT Vmax:         143.00 cm/s LVOT Vmean:        94.500 cm/s LVOT VTI:          0.261 m LVOT/AV VTI ratio: 0.51  AORTA Ao Root diam: 2.70 cm Ao Asc diam:  2.90 cm MITRAL VALVE                TRICUSPID VALVE MV Area (PHT): 2.62 cm     TR Peak grad:   39.7 mmHg MV Decel Time: 289 msec     TR Vmax:        315.00 cm/s MV E velocity: 120.00 cm/s MV A velocity: 151.00 cm/s  SHUNTS MV E/A ratio:  0.79         Systemic VTI:  0.26 m                             Systemic Diam: 2.10 cm Dani Gobble Croitoru MD Electronically signed by Sanda Klein MD Signature Date/Time: 07/17/2021/2:02:07 PM    Final    [place] CHEST SOFT TISSUE  Result Date: 07/17/2021 CLINICAL DATA:  Evaluate AV fistula abscess. Fever and bacteremia with painful catheter EXAM: ULTRASOUND OF right chest SOFT TISSUES TECHNIQUE: Ultrasound examination was performed in the area of clinical concern. COMPARISON:  None similar FINDINGS: Dialysis catheter in the right chest which had an unremarkable appearance on recent chest x-ray. There is fluid encompassing the catheter along the upper aspect of the subcutaneous portion collection measuring 3-4 mm in maximal thickness. No separate abscess is seen. IMPRESSION: Perma catheter on the right. A thin amount of fluid encompasses the upper  subcutaneous catheter, history suggesting purulence. Electronically Signed   By:  Monte Fantasia M.D.   On: 07/17/2021 11:38   CT CHEST ABDOMEN PELVIS WO CONTRAST  Result Date: 07/16/2021 CLINICAL DATA:  Respiratory illness, nondiagnostic xray. Respiratory illness/flank pain EXAM: CT CHEST, ABDOMEN AND PELVIS WITHOUT CONTRAST TECHNIQUE: Multidetector CT imaging of the chest, abdomen and pelvis was performed following the standard protocol without IV contrast. COMPARISON:  CT chest 02/06/2021, CT cardiac 02/27/2021, CT abdomen pelvis 05/18/2012 FINDINGS: CT CHEST FINDINGS Cardiovascular: Normal heart size. No significant pericardial effusion. The thoracic aorta is normal in caliber. At least mild atherosclerotic plaque of the thoracic aorta. Four-vessel coronary artery calcifications. Mitral annular calcifications. The main pulmonary artery enlarged in caliber. Mediastinum/Nodes: No gross hilar adenopathy, noting limited sensitivity for the detection of hilar adenopathy on this noncontrast study. No enlarged mediastinal or axillary lymph nodes. Thyroid gland, trachea, and esophagus demonstrate no significant findings. Possibly tiny hiatal hernia. Lungs/Pleura: Bilateral lower lobe subsegmental atelectasis. Slightly more conspicuous but grossly stable in size triangular subpleural 6 mm pulmonary nodule within the right upper lobe likely representing an intrapulmonary lymph node (4:59). Stable 5 mm left lower lobe pulmonary nodule (4:107). No new pulmonary nodule. No pulmonary mass. No focal consolidation. Musculoskeletal: No chest wall abnormality. No suspicious lytic or blastic osseous lesions. No acute displaced fracture. Multilevel degenerative changes of the spine. CT ABDOMEN PELVIS FINDINGS Hepatobiliary: No focal liver abnormality is seen. Status post cholecystectomy. No biliary dilatation. Pancreas: No focal lesion. Normal pancreatic contour. No surrounding inflammatory changes. No main pancreatic ductal dilatation. Spleen: Normal in size without focal abnormality. Several splenules  noted within the left upper lobe with the largest 1 measuring up to 4 cm. Adrenals/Urinary Tract: No adrenal nodule bilaterally. Bilateral renal cortical scarring. No nephrolithiasis, no hydronephrosis. There is a 2.3 cm left renal fluid density lesion that likely represents a simple renal cyst. Slight interval increase in size of two low-density slightly heterogeneous right renal lesions measuring 2.7 and 2.3 cm demonstrating indeterminate densities of 22 and 26 Hounsfield units. Subcentimeter hypodensities are too small to characterize. Otherwise no contour-deforming renal mass. No ureterolithiasis or hydroureter. The urinary bladder is decompressed and grossly unremarkable. Stomach/Bowel: Partial colectomy stomach is within normal limits. No evidence of bowel wall thickening or dilatation. Vascular/Lymphatic: No abdominal aorta or iliac aneurysm. Severe atherosclerotic plaque of the aorta and its branches. No abdominal, pelvic, or inguinal lymphadenopathy. Reproductive: Status post hysterectomy. No adnexal masses. Other: No intraperitoneal free fluid. No intraperitoneal free gas. No organized fluid collection. Musculoskeletal: Small fat containing umbilical hernia. Healed anterior abdominal incision. No suspicious lytic or blastic osseous lesions. No acute displaced fracture. Multilevel degenerative changes of the spine. IMPRESSION: 1. Slightly more conspicuous but grossly stable in size 6 mm triangular right upper lobe subpleural nodule. Stable 5 mm left lower lobe pulmonary nodule. No new pulmonary nodule or mass. Given stability, recommend CT at 12-55month is considered optional for low-risk patients, but is recommended for high-risk patients. This recommendation follows the consensus statement: Guidelines for Management of Incidental Pulmonary Nodules Detected on CT Images: From the Fleischner Society 2017; Radiology 2017; 284:228-243. 2. Enlarged main pulmonary artery suggestive of pulmonary hypertension.  3. Interval increase in size of two indeterminate slightly heterogeneous 2.3cm and 2.7 cm right renal lesions. Recommend MRI renal protocol for further evaluation. 4. Aortic Atherosclerosis (ICD10-I70.0) including four-vessel coronary artery calcifications and mitral annular calcifications. 5. Please note limited evaluation due to noncontrast study. Electronically Signed   By: MClelia CroftD.  On: 07/16/2021 16:50    Microbiology: Recent Results (from the past 240 hour(s))  Blood Culture (routine x 2)     Status: None   Collection Time: 07/16/21  1:15 PM   Specimen: BLOOD  Result Value Ref Range Status   Specimen Description BLOOD BLOOD RIGHT FOREARM  Final   Special Requests   Final    BOTTLES DRAWN AEROBIC AND ANAEROBIC Blood Culture adequate volume   Culture   Final    NO GROWTH 5 DAYS Performed at [facility], [address], [place], [place] [address]    Report Status 07/21/2021 FINAL  Final  Resp Panel by RT-PCR (Flu A&B, Covid) Nasopharyngeal Swab     Status: None   Collection Time: 07/16/21  1:17 PM   Specimen: Nasopharyngeal Swab; Nasopharyngeal(NP) swabs in vial transport medium  Result Value Ref Range Status   SARS Coronavirus 2 by RT PCR NEGATIVE NEGATIVE Final    Comment: (NOTE) SARS-CoV-2 target nucleic acids are NOT DETECTED.  The SARS-CoV-2 RNA is generally detectable in upper respiratory specimens during the acute phase of infection. The lowest concentration of SARS-CoV-2 viral copies this assay can detect is 138 copies/mL. A negative result does not preclude SARS-Cov-2 infection and should not be used as the sole basis for treatment or other patient management decisions. A negative result may occur with  improper specimen collection/handling, submission of specimen other than nasopharyngeal swab, presence of viral mutation(s) within the areas targeted by this assay, and inadequate number of viral copies(<138 copies/mL). A negative result must be  combined with clinical observations, patient history, and epidemiological information. The expected result is Negative.  Fact Sheet for Patients:  EntrepreneurPulse.com.au  Fact Sheet for Healthcare Providers:  IncredibleEmployment.be  This test is no t yet approved or cleared by the [place] FDA and  has been authorized for detection and/or diagnosis of SARS-CoV-2 by FDA under an Emergency Use Authorization (EUA). This EUA will remain  in effect (meaning this test can be used) for the duration of the COVID-19 declaration under Section 564(b)(1) of the Act, 21 U.S.C.section 360bbb-3(b)(1), unless the authorization is terminated  or revoked sooner.       Influenza A by PCR NEGATIVE NEGATIVE Final   Influenza B by PCR NEGATIVE NEGATIVE Final    Comment: (NOTE) The Xpert Xpress SARS-CoV-2/FLU/RSV plus assay is intended as an aid in the diagnosis of influenza from Nasopharyngeal swab specimens and should not be used as a sole basis for treatment. Nasal washings and aspirates are unacceptable for Xpert Xpress SARS-CoV-2/FLU/RSV testing.  Fact Sheet for Patients: EntrepreneurPulse.com.au  Fact Sheet for Healthcare Providers: IncredibleEmployment.be  This test is not yet approved or cleared by the [place] FDA and has been authorized for detection and/or diagnosis of SARS-CoV-2 by FDA under an Emergency Use Authorization (EUA). This EUA will remain in effect (meaning this test can be used) for the duration of the COVID-19 declaration under Section 564(b)(1) of the Act, 21 U.S.C. section 360bbb-3(b)(1), unless the authorization is terminated or revoked.  Performed at [facility], [place] [address], [place], [place] [address]   Blood Culture (routine x 2)     Status: Abnormal   Collection Time: 07/16/21  2:04 PM   Specimen: BLOOD RIGHT WRIST  Result Value Ref Range Status   Specimen  Description BLOOD RIGHT WRIST  Final   Special Requests   Final    BOTTLES DRAWN AEROBIC AND ANAEROBIC Blood Culture results may not be optimal due to  an inadequate volume of blood received in culture bottles   Culture  Setup Time   Final    GRAM POSITIVE COCCI IN CLUSTERS IN BOTH AEROBIC AND ANAEROBIC BOTTLES CRITICAL RESULT CALLED TO, READ BACK BY AND VERIFIED WITHEather Colas PHARMD, AT 5852 07/17/21 [place] Performed at [facility], [place] [address], [place], [place] [address]    Culture STAPHYLOCOCCUS AUREUS (A)  Final   Report Status 07/19/2021 FINAL  Final   Organism ID, Bacteria STAPHYLOCOCCUS AUREUS  Final      Susceptibility   Staphylococcus aureus - MIC*    CIPROFLOXACIN <=0.5 SENSITIVE Sensitive     ERYTHROMYCIN <=0.25 SENSITIVE Sensitive     GENTAMICIN <=0.5 SENSITIVE Sensitive     OXACILLIN <=0.25 SENSITIVE Sensitive     TETRACYCLINE <=1 SENSITIVE Sensitive     VANCOMYCIN <=0.5 SENSITIVE Sensitive     TRIMETH/SULFA 160 RESISTANT Resistant     CLINDAMYCIN <=0.25 SENSITIVE Sensitive     RIFAMPIN <=0.5 SENSITIVE Sensitive     Inducible Clindamycin NEGATIVE Sensitive     * STAPHYLOCOCCUS AUREUS  Blood Culture ID Panel (Reflexed)     Status: Abnormal   Collection Time: 07/16/21  2:04 PM  Result Value Ref Range Status   Enterococcus faecalis NOT DETECTED NOT DETECTED Final   Enterococcus Faecium NOT DETECTED NOT DETECTED Final   Listeria monocytogenes NOT DETECTED NOT DETECTED Final   Staphylococcus species DETECTED (A) NOT DETECTED Final    Comment: CRITICAL RESULT CALLED TO, READ BACK BY AND VERIFIED WITH: C. AMEND PHARMD, AT 0450 07/17/21 D. VANHOOK    Staphylococcus aureus (BCID) DETECTED (A) NOT DETECTED Final    Comment: CRITICAL RESULT CALLED TO, READ BACK BY AND VERIFIED WITH: C. AMEND PHARMD, AT 2353 07/17/21 D. VANHOOK    Staphylococcus epidermidis NOT DETECTED NOT DETECTED Final   Staphylococcus lugdunensis NOT DETECTED NOT DETECTED Final   Streptococcus  species NOT DETECTED NOT DETECTED Final   Streptococcus agalactiae NOT DETECTED NOT DETECTED Final   Streptococcus pneumoniae NOT DETECTED NOT DETECTED Final   Streptococcus pyogenes NOT DETECTED NOT DETECTED Final   A.calcoaceticus-baumannii NOT DETECTED NOT DETECTED Final   Bacteroides fragilis NOT DETECTED NOT DETECTED Final   Enterobacterales NOT DETECTED NOT DETECTED Final   Enterobacter cloacae complex NOT DETECTED NOT DETECTED Final   Escherichia coli NOT DETECTED NOT DETECTED Final   Klebsiella aerogenes NOT DETECTED NOT DETECTED Final   Klebsiella oxytoca NOT DETECTED NOT DETECTED Final   Klebsiella pneumoniae NOT DETECTED NOT DETECTED Final   Proteus species NOT DETECTED NOT DETECTED Final   Salmonella species NOT DETECTED NOT DETECTED Final   Serratia marcescens NOT DETECTED NOT DETECTED Final   Haemophilus influenzae NOT DETECTED NOT DETECTED Final   Neisseria meningitidis NOT DETECTED NOT DETECTED Final   Pseudomonas aeruginosa NOT DETECTED NOT DETECTED Final   Stenotrophomonas maltophilia NOT DETECTED NOT DETECTED Final   Candida albicans NOT DETECTED NOT DETECTED Final   Candida auris NOT DETECTED NOT DETECTED Final   Candida glabrata NOT DETECTED NOT DETECTED Final   Candida krusei NOT DETECTED NOT DETECTED Final   Candida parapsilosis NOT DETECTED NOT DETECTED Final   Candida tropicalis NOT DETECTED NOT DETECTED Final   Cryptococcus neoformans/gattii NOT DETECTED NOT DETECTED Final   Meth resistant mecA/C and MREJ NOT DETECTED NOT DETECTED Final    Comment: Performed at [facility], [address] [place], [place] [address]  Gastrointestinal Panel by PCR , Stool     Status: None  Collection Time: 07/16/21  3:08 PM   Specimen: STOOL  Result Value Ref Range Status   Campylobacter species NOT DETECTED NOT DETECTED Final   Plesimonas shigelloides NOT DETECTED NOT DETECTED Final   Salmonella species NOT DETECTED NOT DETECTED Final   Yersinia enterocolitica NOT  DETECTED NOT DETECTED Final   Vibrio species NOT DETECTED NOT DETECTED Final   Vibrio cholerae NOT DETECTED NOT DETECTED Final   Enteroaggregative E coli (EAEC) NOT DETECTED NOT DETECTED Final   Enteropathogenic E coli (EPEC) NOT DETECTED NOT DETECTED Final   Enterotoxigenic E coli (ETEC) NOT DETECTED NOT DETECTED Final   Shiga like toxin producing E coli (STEC) NOT DETECTED NOT DETECTED Final   Shigella/Enteroinvasive E coli (EIEC) NOT DETECTED NOT DETECTED Final   Cryptosporidium NOT DETECTED NOT DETECTED Final   Cyclospora cayetanensis NOT DETECTED NOT DETECTED Final   Entamoeba histolytica NOT DETECTED NOT DETECTED Final   Giardia lamblia NOT DETECTED NOT DETECTED Final   Adenovirus F40/41 NOT DETECTED NOT DETECTED Final   Astrovirus NOT DETECTED NOT DETECTED Final   Norovirus GI/GII NOT DETECTED NOT DETECTED Final   Rotavirus A NOT DETECTED NOT DETECTED Final   Sapovirus (I, II, IV, and V) NOT DETECTED NOT DETECTED Final    Comment: Performed at [facility], [address] [place], [place] [address]  C Difficile Quick Screen w PCR reflex     Status: None   Collection Time: 07/16/21  3:08 PM   Specimen: STOOL  Result Value Ref Range Status   C Diff antigen NEGATIVE NEGATIVE Final   C Diff toxin NEGATIVE NEGATIVE Final   C Diff interpretation No C. difficile detected.  Final    Comment: Performed at [facility], [place] [address], [place], [place] [address]  Culture, blood (Routine X 2) w Reflex to ID Panel     Status: None   Collection Time: 07/17/21  8:45 AM   Specimen: BLOOD RIGHT ARM  Result Value Ref Range Status   Specimen Description BLOOD RIGHT ARM  Final   Special Requests   Final    BOTTLES DRAWN AEROBIC AND ANAEROBIC Blood Culture adequate volume   Culture   Final    NO GROWTH 5 DAYS Performed at [facility], [place] [address] [place], [place] [address]    Report Status 07/22/2021 FINAL  Final  Culture, blood (Routine X 2) w Reflex to ID Panel      Status: None   Collection Time: 07/17/21  8:55 AM   Specimen: BLOOD LEFT HAND  Result Value Ref Range Status   Specimen Description BLOOD LEFT HAND  Final   Special Requests   Final    BOTTLES DRAWN AEROBIC AND ANAEROBIC Blood Culture adequate volume   Culture   Final    NO GROWTH 5 DAYS Performed at [facility], [place] [address] [place], [place] [address]    Report Status 07/22/2021 FINAL  Final  Culture, blood (routine x 2)     Status: None (Preliminary result)   Collection Time: 07/18/21  3:41 AM   Specimen: BLOOD RIGHT HAND  Result Value Ref Range Status   Specimen Description BLOOD RIGHT HAND  Final   Special Requests   Final    BOTTLES DRAWN AEROBIC ONLY Blood Culture adequate volume   Culture   Final    NO GROWTH 4 DAYS Performed at [facility], [place] [address], [place],  [address]    Report Status PENDING  Incomplete  Culture, blood (routine x 2)     Status: None (Preliminary result)   Collection Time: 07/18/21  3:41 AM   Specimen: BLOOD RIGHT HAND  Result Value Ref Range Status   Specimen Description BLOOD RIGHT HAND  Final   Special Requests   Final    BOTTLES DRAWN AEROBIC ONLY Blood Culture adequate volume   Culture   Final    NO GROWTH 4 DAYS Performed at [facility], [address] [place], [place] [address]    Report Status PENDING  Incomplete  Culture, blood (routine x 2)     Status: None (Preliminary result)   Collection Time: 07/19/21  4:35 PM   Specimen: BLOOD  Result Value Ref Range Status   Specimen Description BLOOD RIGHT ANTECUBITAL  Final   Special Requests   Final    BOTTLES DRAWN AEROBIC ONLY Blood Culture results may not be optimal due to an inadequate volume of blood received in culture bottles   Culture   Final    NO GROWTH 3 DAYS Performed at [facility], [place] [address] [place], [place] [address]    Report Status PENDING  Incomplete  Culture, blood (routine x 2)     Status: None (Preliminary result)    Collection Time: 07/19/21  4:48 PM   Specimen: BLOOD  Result Value Ref Range Status   Specimen Description BLOOD RIGHT UPPER ARM  Final   Special Requests   Final    BOTTLES DRAWN AEROBIC ONLY Blood Culture results may not be optimal due to an inadequate volume of blood received in culture bottles   Culture   Final    NO GROWTH 3 DAYS Performed at [facility], [place] [address] [place],  [address]    Report Status PENDING  Incomplete     Labs: Basic Metabolic Panel: Recent Labs  Lab 07/17/21 0408 07/18/21 1430 07/18/21 2345 07/20/21 0149 07/21/21 0120 07/22/21 0046  NA 131* 133* 131* 136 133* 134*  K 4.9 4.5 3.5 3.8 3.6 4.0  CL 101 104 98 102 101 97*  CO2 15* 14* 21* 21* 17* 22  GLUCOSE 131* 113* 138* 121* 126* 78  BUN 30* 50* 23 34* 42* 18  CREATININE 7.20* 8.91* 5.80* 8.09* 9.47* 6.33*  CALCIUM 7.3* 7.0* 6.5* 6.9* 7.0* 7.8*  MG 1.7  --   --   --  1.8  --   PHOS 6.5* 5.9*  --   --   --   --    Liver Function Tests: Recent Labs  Lab 07/16/21 1402 07/18/21 1430  AST 39  --   ALT 21  --   ALKPHOS 93  --   BILITOT 1.2  --   PROT 7.8  --   ALBUMIN 2.7* 2.0*   No results for input(s): LIPASE, AMYLASE in the last 168 hours. No results for input(s): AMMONIA in the last 168 hours. CBC: Recent Labs  Lab 07/16/21 1402 07/17/21 0408 07/18/21 0341 07/18/21 2345 07/20/21 0149 07/21/21 0120 07/22/21 0046  WBC 26.9*   < > 20.4* 14.3* 13.2* 15.5* 19.9*  NEUTROABS 23.8*  --   --   --   --   --   --   HGB 11.9*   < > 8.1* 9.8* 9.3* 8.9* 10.2*  HCT 37.8   < > 25.6* 30.0* 28.8* 27.7* 32.5*  MCV 92.2   < > 92.4 89.8 91.1 90.8 90.3  PLT 143*   < > 159 129* 160 206 184   < > =  values in this interval not displayed.   Cardiac Enzymes: No results for input(s): CKTOTAL, CKMB, CKMBINDEX, TROPONINI in the last 168 hours. BNP: BNP (last 3 results) Recent Labs    10/30/20 0407  BNP 406.5*    ProBNP (last 3 results) No results for input(s): PROBNP in the last  8760 hours.  CBG: Recent Labs  Lab 07/19/21 1135 07/19/21 1621 07/19/21 2017 07/20/21 0047 07/20/21 0416  GLUCAP 95 120* 150* 130* 126*       Signed:  Cristal Deer, MD [facility] 07/22/2021, 10:44 AM

## 2021-07-22 NOTE — TOC Transition Note (Signed)
Transition of Care [facility]) - CM/SW Discharge Note   Patient Details  Name: Whitney Walker MRN: [id number] Date of Birth: Jul 23, 1956  [facility] of [facility] CM/SW Contact:  Bartholomew Crews, RN Phone Number: [phone] 07/22/2021, 10:52 AM   Clinical Narrative:     Spoke with patient on hospital room phone [phone] to discuss transition planning. PTA home alone. Has 2 daughters and a granddaughter who will be checking on her.   Discussed DME. Has rollator and walker. Requesting 3-N-1. Referral to [facility] for delivery to the room.   Discussed HH PT recommendations. Offered choice of agency. First choice [facility] - referral accepted for [facility] PT. Requested [facility] PT order with Face to Face from [facility].   Family to provide transportation home at discharge. She has dialysis transportation through [employer] ([place] transportation).   No further TOC needs identified.   Final next level of care: [place] Barriers to Discharge: No Barriers Identified   Patient Goals and CMS Choice Patient states their goals for this hospitalization and ongoing recovery are:: return home with family support CMS Medicare.gov Compare Post Acute Care list provided to:: Patient Choice offered to / list presented to : Patient  Discharge Placement                       Discharge Plan and Services                DME Arranged: 3-N-1 DME Agency: [facility] Date DME Agency Contacted: 07/22/21 Time DME Agency Contacted: [id number] Representative spoke with at DME Agency: [place]: PT [place]: [place] Date [place]: 07/22/21 Time [place]: 1052 Representative spoke with at [place] - referral pending acceptance  Social Determinants of Health (SDOH) Interventions     Readmission Risk Interventions Readmission Risk Prevention Plan 11/01/2020  Transportation Screening Complete  PCP or Specialist Appt within 3-5 Days Complete   HRI or [place] Complete  Social Work Consult for [place] Planning/Counseling Complete  Palliative Care Screening Not Applicable  Medication Review Press photographer) Complete  Some recent data might be hidden

## 2021-07-22 NOTE — Progress Notes (Signed)
PHARMACY CONSULT NOTE FOR:  OUTPATIENT  PARENTERAL ANTIBIOTIC THERAPY (OPAT)  Indication: Bacteremia Regimen: Cefazolin 2 gm IV with hemodialysis MWF End date: 08/29/21  IV antibiotic discharge orders are pended. To discharging provider:  please sign these orders via discharge navigator,  Select New Orders & click on the button choice - Manage This Unsigned Work.     Thank you for allowing pharmacy to be a part of this patient's care.  Corinda Gubler 07/22/2021, 10:36 AM

## 2021-07-22 NOTE — Progress Notes (Signed)
[place] for IV Heparin Indication: Hx of DVT  Patient Measurements: Height: 5\' 1"  (154.9 cm) Weight: 94.5 kg (208 lb 5.4 oz) IBW/kg (Calculated) : 47.8 Heparin Dosing Weight: 69 kg   Vital Signs: Temp: 97.7 F (36.5 C) (07/24 0550) Temp Source: Oral (07/24 0550) BP: 106/52 (07/24 0550) Pulse Rate: 78 (07/24 0550)  Labs: Recent Labs    07/20/21 0149 07/20/21 1046 07/21/21 0120 07/21/21 2220 07/22/21 0046 07/22/21 0711  HGB 9.3*  --  8.9*  --  10.2*  --   HCT 28.8*  --  27.7*  --  32.5*  --   PLT 160  --  206  --  184  --   HEPARINUNFRC <0.10*   < > 0.37 0.37  --  0.37  CREATININE 8.09*  --  9.47*  --  6.33*  --    < > = values in this interval not displayed.     Estimated Creatinine Clearance: 9.2 mL/min (A) (by C-G formula based on SCr of 6.33 mg/dL (H)).  Medications:  Apixaban 2.5 mg BID PTA  Assessment: 65 yr old woman with ESRD (on MWF HD) presented to ED critically ill due to MSSA bacteremia and septic shock. Patient has hx of VTE (05/01/21) and was on apixaban 2.5 mg PO BID PTA (last dose unknown, but within past week per patient). Heparin level and PTT correlating.  Heparin level this morning is therapeutic at 0.37 on 1700 units/hr.  CBC stable.    Goal of Therapy:  Heparin level 0.3 -0.7 units/ml Monitor platelets by anticoagulation protocol: Yes   Plan:  -Continue Heparin at 1700 units/hr - Daily HL and CBC  Thank you for allowing pharmacy to be a part of this patient's care.  Alanda Slim, PharmD, [facility] Clinical Pharmacist Please see AMION for all Pharmacists' Contact Phone Numbers 07/22/2021, 7:57 AM

## 2021-07-22 NOTE — Progress Notes (Signed)
Called into patient's room,she is visibly upset and fidgety for waiting so long , for what time her bedside commode would be deliver today.Coordinated with CSW ,and informed patient what time ,it would be. Patient said '' if I know this would be long , I should buy it ,instead of waiting for long. Nurse said '' I explained to you the process this morning,its outside vendor ,not the hospital supply.To which the patient said '' I don't need it anyway''.Reminded patient that she was the one who requested it becouse she needed it,nurse added that ,I can discharge you right now but per CSW ,your [facility]. would be deliver to your place 4 days after.Patient agreed on this and proceeded to call her daughter for a ride.Nurse heard that her daughter would be arriving to pick her mother  within the next one to one and half hour. Patient said ''this is going to be a bad day''.

## 2021-07-22 NOTE — Progress Notes (Signed)
Occupational Therapy Treatment Patient Details Name: Whitney Walker MRN: [id number] DOB: 1956/12/26 Today's Date: 07/22/2021    History of present illness 65 year old woman presented to ED 07/16/21 with gradual onset of constant diffuse achy low back pain which had progressed over several weeks, followed by new onset of nausea, vomiting and diarrhea with fever 100. Found to be in septic shock admitted requiring titration of fluids and vasopressors. PMH: OSA; PVD; ESRD on MWF HD; DVT/PE; CAD; COPD; and colon cancer.   OT comments  Pt progressing towards established OT goals and motivated to participate in therapy session. Pt performing functional mobility with supervision with RW and Min Guard without AD. Pt educated and demonstrating tub/shower transfer with min Guard A for safety. Pt with difficulty conceptualizing how to use shower transfer at home. Pt continues to present with decreased activity tolerance, problem solving, awareness, and safety. Recommend discharge home with [facility] and will continue to follow acutely as admitted to optimize safety and independence in ADLs.   Follow Up Recommendations  Home health OT    Equipment Recommendations  None recommended by OT    Recommendations for Other Services      Precautions / Restrictions Precautions Precautions: None Restrictions Weight Bearing Restrictions: No       Mobility Bed Mobility               General bed mobility comments: Pt sitting eob on arrival.    Transfers Overall transfer level: Needs assistance Equipment used: Rolling walker (2 wheeled) Transfers: Sit to/from Stand Sit to Stand: Supervision         General transfer comment: Supervision for safety    Balance Overall balance assessment: Needs assistance Sitting-balance support: Feet supported;No upper extremity supported Sitting balance-Leahy Scale: Good     Standing balance support: Bilateral upper extremity supported;During functional  activity;No upper extremity supported Standing balance-Leahy Scale: Fair Standing balance comment: benefits from UE support                           ADL either performed or assessed with clinical judgement   ADL Overall ADL's : Needs assistance/impaired                                 Tub/ Shower Transfer: Tub transfer;Min guard;Ambulation;Rolling walker;Shower seat   Functional mobility during ADLs: Min guard;Rolling walker General ADL Comments: Pt with difficulty visualizing her home environment prior to practicing tub/shower transfer. Pt educated and demonstrating use of compensatory techniques for shower transfers.     Vision   Vision Assessment?: No apparent visual deficits   Perception     Praxis      Cognition Arousal/Alertness: Awake/alert Behavior During Therapy: WFL for tasks assessed/performed Overall Cognitive Status: Impaired/Different from baseline Area of Impairment: Following commands;Awareness;Problem solving;Safety/judgement                       Following Commands: Follows one step commands consistently;Follows one step commands with increased time Safety/Judgement: Decreased awareness of safety Awareness: Emergent Problem Solving: Slow processing;Requires verbal cues General Comments: Pt pleasant and conversational throughout session. Pt with decreased safety awareness when discussing shower transfers. Pt requiring increased time to understand education provided. Pt with inconsistent reports of causes LUE deficits today vs eval.        Exercises     Shoulder Instructions  General Comments Pt pleasant throughout. Reporting she is ready to go home.    Pertinent Vitals/ Pain       Pain Assessment: No/denies pain  Home Living                                          Prior Functioning/Environment              Frequency  Min 2X/week        Progress Toward Goals  OT Goals(current  goals can now be found in the care plan section)  Progress towards OT goals: Progressing toward goals  Acute Rehab OT Goals Patient Stated Goal: independent ambulation OT Goal Formulation: With patient Time For Goal Achievement: 08/03/21 Potential to Achieve Goals: Good ADL Goals Pt Will Perform Grooming: with modified independence;standing Pt Will Perform Upper Body Dressing: with modified independence;sitting Pt Will Perform Lower Body Dressing: with modified independence;sit to/from stand Pt Will Perform Tub/Shower Transfer: with modified independence;shower seat;ambulating;rolling walker Additional ADL Goal #1: Pt will demonstrate increased activity tolerance by performing 3 standing grooming tasks. Additional ADL Goal #2: Pt will independently verbalize 3 energy conservation techniques for ADLs and IADLs.  Plan Discharge plan remains appropriate    Co-evaluation                 AM-PAC OT "6 Clicks" Daily Activity     Outcome Measure   Help from another person eating meals?: A Little Help from another person taking care of personal grooming?: A Little Help from another person toileting, which includes using toliet, bedpan, or urinal?: A Little Help from another person bathing (including washing, rinsing, drying)?: A Little Help from another person to put on and taking off regular upper body clothing?: A Little Help from another person to put on and taking off regular lower body clothing?: A Little 6 Click Score: 18    End of Session Equipment Utilized During Treatment: Gait belt;Rolling walker  OT Visit Diagnosis: Unsteadiness on feet (R26.81);Muscle weakness (generalized) (M62.81);Other symptoms and signs involving cognitive function   Activity Tolerance Patient tolerated treatment well   Patient Left with call bell/phone within reach;in bed   Nurse Communication Mobility status        Time: 1047-1101 OT Time Calculation (min): 14 min  Charges: OT General  Charges $OT Visit: 1 Visit OT Treatments $Self Care/Home Management : 8-22 mins  Shanda Howells, OTDS    Shanda Howells 07/22/2021, 11:47 AM

## 2021-07-22 NOTE — Progress Notes (Signed)
[facility] Progress Note   Subjective:  Seen in room. Had new TDC placed today by IR, going for HD this afternoon. For dc after HD.   Objective Vitals:   07/21/21 2030 07/21/21 2336 07/22/21 0550 07/22/21 1021  BP: 108/64 102/89 (!) 106/52 134/80  Pulse: 97 (!) 105 78 80  Resp:  19 19 18   Temp: 98.1 F (36.7 C)  97.7 F (36.5 C) 98.1 F (36.7 C)  TempSrc: Oral  Oral Oral  SpO2: 99% 94% 92% (!) 89%  Weight: 94.5 kg     Height:       Physical Exam General: Well appearing woman, NAD. Room air. Heart: RRR; no murmur Lungs: CTAB Abdomen: soft, non-tender Extremities: No LE edema Dialysis Access: none at this time Additional Objective Labs: Basic Metabolic Panel: Recent Labs  Lab 07/17/21 0408 07/18/21 1430 07/18/21 2345 07/20/21 0149 07/21/21 0120 07/22/21 0046  NA 131* 133*   < > 136 133* 134*  K 4.9 4.5   < > 3.8 3.6 4.0  CL 101 104   < > 102 101 97*  CO2 15* 14*   < > 21* 17* 22  GLUCOSE 131* 113*   < > 121* 126* 78  BUN 30* 50*   < > 34* 42* 18  CREATININE 7.20* 8.91*   < > 8.09* 9.47* 6.33*  CALCIUM 7.3* 7.0*   < > 6.9* 7.0* 7.8*  PHOS 6.5* 5.9*  --   --   --   --    < > = values in this interval not displayed.    Liver Function Tests: Recent Labs  Lab 07/16/21 1402 07/18/21 1430  AST 39  --   ALT 21  --   ALKPHOS 93  --   BILITOT 1.2  --   PROT 7.8  --   ALBUMIN 2.7* 2.0*     CBC: Recent Labs  Lab 07/16/21 1402 07/17/21 0408 07/18/21 0341 07/18/21 2345 07/20/21 0149 07/21/21 0120 07/22/21 0046  WBC 26.9*   < > 20.4* 14.3* 13.2* 15.5* 19.9*  NEUTROABS 23.8*  --   --   --   --   --   --   HGB 11.9*   < > 8.1* 9.8* 9.3* 8.9* 10.2*  HCT 37.8   < > 25.6* 30.0* 28.8* 27.7* 32.5*  MCV 92.2   < > 92.4 89.8 91.1 90.8 90.3  PLT 143*   < > 159 129* 160 206 184   < > = values in this interval not displayed.    Blood Culture    Component Value Date/Time   SDES BLOOD RIGHT UPPER ARM 07/19/2021 1648   SPECREQUEST  07/19/2021  1648    BOTTLES DRAWN AEROBIC ONLY Blood Culture results may not be optimal due to an inadequate volume of blood received in culture bottles   CULT  07/19/2021 1648    NO GROWTH 3 DAYS Performed at [facility], [place] [address], [place], [place] [address]    REPTSTATUS PENDING 07/19/2021 1648   Studies/Results: IR Fluoro Guide CV Line Right  Result Date: 07/21/2021 CLINICAL DATA:  Insert renal disease and status post removal of tunneled right jugular hemodialysis catheter on 07/19/2021 due to bacteremia. Bacteremia has been treated and the patient now requires placement of a new tunneled dialysis catheter for continued hemodialysis. EXAM: TUNNELED CENTRAL VENOUS HEMODIALYSIS CATHETER PLACEMENT WITH ULTRASOUND AND FLUOROSCOPIC GUIDANCE ANESTHESIA/SEDATION: 1.5 mg IV Versed; 50 mcg IV Fentanyl. Total Moderate Sedation Time:   25 minutes.  The patient's level of consciousness and physiologic status were continuously monitored during the procedure by Radiology nursing. MEDICATIONS: No additional medications. FLUOROSCOPY TIME:  2 minutes and 36 seconds.  18.0 mGy. PROCEDURE: The procedure, risks, benefits, and alternatives were explained to the patient. Questions regarding the procedure were encouraged and answered. The patient understands and consents to the procedure. A timeout was performed prior to initiating the procedure. The left neck and chest were prepped with chlorhexidine in a sterile fashion, and a sterile drape was applied covering the operative field. Maximum barrier sterile technique with sterile gowns and gloves were used for the procedure. Local anesthesia was provided with 1% lidocaine. Ultrasound was used to confirm patency of the left internal jugular vein. After creating a small venotomy incision, a 21 gauge needle was advanced into the left internal jugular vein under direct, real-time ultrasound guidance. Ultrasound image documentation was performed. After securing guidewire access,  an 8 Fr dilator was placed. A J-wire was kinked to measure appropriate catheter length. A Palindrome tunneled hemodialysis catheter measuring 28 cm from tip to cuff was chosen for placement. This was tunneled in a retrograde fashion from the chest wall to the venotomy incision. At the venotomy, serial dilatation was performed and a 15 Fr peel-away sheath was placed over a guidewire. The catheter was then placed through the sheath and the sheath removed. Final catheter positioning was confirmed and documented with a fluoroscopic spot image. The catheter was aspirated, flushed with saline, and injected with appropriate volume heparin dwells. The venotomy incision was closed with subcuticular 4-0 Vicryl. Dermabond was applied to the incision. The catheter exit site was secured with 0-Prolene retention sutures. COMPLICATIONS: None.  No pneumothorax. FINDINGS: Initial ultrasound demonstrates occlusive thrombus in the right internal jugular vein after removal of the recent tunneled catheter. The left internal jugular vein is normally patent. After new left-sided catheter placement, the tip lies in the right atrium. The catheter aspirates normally and is ready for immediate use. IMPRESSION: Placement of tunneled hemodialysis catheter via the left internal jugular vein. The catheter tip lies in the right atrium. The catheter is ready for immediate use. Electronically Signed   By: Aletta Edouard M.D.   On: 07/21/2021 12:28   IR US Guide Vasc Access Right  Result Date: 07/21/2021 CLINICAL DATA:  Insert renal disease and status post removal of tunneled right jugular hemodialysis catheter on 07/19/2021 due to bacteremia. Bacteremia has been treated and the patient now requires placement of a new tunneled dialysis catheter for continued hemodialysis. EXAM: TUNNELED CENTRAL VENOUS HEMODIALYSIS CATHETER PLACEMENT WITH ULTRASOUND AND FLUOROSCOPIC GUIDANCE ANESTHESIA/SEDATION: 1.5 mg IV Versed; 50 mcg IV Fentanyl. Total  Moderate Sedation Time:   25 minutes. The patient's level of consciousness and physiologic status were continuously monitored during the procedure by Radiology nursing. MEDICATIONS: No additional medications. FLUOROSCOPY TIME:  2 minutes and 36 seconds.  18.0 mGy. PROCEDURE: The procedure, risks, benefits, and alternatives were explained to the patient. Questions regarding the procedure were encouraged and answered. The patient understands and consents to the procedure. A timeout was performed prior to initiating the procedure. The left neck and chest were prepped with chlorhexidine in a sterile fashion, and a sterile drape was applied covering the operative field. Maximum barrier sterile technique with sterile gowns and gloves were used for the procedure. Local anesthesia was provided with 1% lidocaine. Ultrasound was used to confirm patency of the left internal jugular vein. After creating a small venotomy incision, a 21 gauge needle was advanced into  the left internal jugular vein under direct, real-time ultrasound guidance. Ultrasound image documentation was performed. After securing guidewire access, an 8 Fr dilator was placed. A J-wire was kinked to measure appropriate catheter length. A Palindrome tunneled hemodialysis catheter measuring 28 cm from tip to cuff was chosen for placement. This was tunneled in a retrograde fashion from the chest wall to the venotomy incision. At the venotomy, serial dilatation was performed and a 15 Fr peel-away sheath was placed over a guidewire. The catheter was then placed through the sheath and the sheath removed. Final catheter positioning was confirmed and documented with a fluoroscopic spot image. The catheter was aspirated, flushed with saline, and injected with appropriate volume heparin dwells. The venotomy incision was closed with subcuticular 4-0 Vicryl. Dermabond was applied to the incision. The catheter exit site was secured with 0-Prolene retention sutures.  COMPLICATIONS: None.  No pneumothorax. FINDINGS: Initial ultrasound demonstrates occlusive thrombus in the right internal jugular vein after removal of the recent tunneled catheter. The left internal jugular vein is normally patent. After new left-sided catheter placement, the tip lies in the right atrium. The catheter aspirates normally and is ready for immediate use. IMPRESSION: Placement of tunneled hemodialysis catheter via the left internal jugular vein. The catheter tip lies in the right atrium. The catheter is ready for immediate use. Electronically Signed   By: Aletta Edouard M.D.   On: 07/21/2021 12:28    Medications:  sodium chloride 250 mL (07/19/21 0336)    ceFAZolin (ANCEF) IV 1 g (07/21/21 2338)   heparin 1,700 Units/hr (07/22/21 0251)    (feeding supplement) PROSource Plus  30 mL Oral BID BM   aspirin EC  81 mg Oral Daily   atorvastatin  10 mg Oral q1800   Chlorhexidine Gluconate Cloth  6 each Topical Q0600   darbepoetin (ARANESP) injection - DIALYSIS  40 mcg Intravenous Q Wed-HD   [START ON 07/23/2021] doxercalciferol  4 mcg Intravenous Q M,W,F-HD   ferric citrate  630 mg Oral TID WC   insulin aspart  0-6 Units Subcutaneous Q4H   pantoprazole  40 mg Oral Daily   sodium chloride flush  10-40 mL Intracatheter Q12H    Dialysis: MWF at [facility] 3.5h    400/A1.5    90.5kg    2/2.5Ca     P2    TDC   Heparin 6000 - Hectoral 52mcg IV q HD - No ESA, Hgb > 11   Assessment/Plan:  Sepsis/MSSA bacteremia: Repeat Cx 7/19, 7/20 negative -> on Cefazolin. CXR clear. Currently off of pressors. LS MRI negative for diskitis (chronic DDD). Per ID, holding off on TEE given marked improvement. [facility] removed 7/21 for line holiday and was replaced on 7/23. F/U blood cx's from 7/21 are negative and pt is afebrile x 4 days here. Per ID patient needs 6 wks IV Ancef w/ HD through 08/29/21. Will arrange.   ESRD: Usual MWF schedule, dialyzed 7/20 and then [facility] removed 7/21. New TDC placed on 7/23 by IR  and had HD yesterday off schedule. For dc today, will resume MWF HD schedule at OP unit tomorrow.    Volume - looks euvolemic on exam, 74kg today after 2.5 L off w/ HD yest, still 4 kg up but no vol excess by exam. Defer to OP scales.   Anemia: Hgb 9.3, continue Aranesp q Wed. Metabolic bone disease: CorrCa and Phos ok, continue home binders/ VDRA.  Nutrition: Alb low, continue supplements.  Hx DVT/PE: eliquis resume at dc  Kelly Splinter, MD 07/22/2021, 5:31 PM

## 2021-07-22 NOTE — Progress Notes (Signed)
Asked patient if she wants to have her IV antibiotic before I pull her iv out and while she still waiting for her ride.Informed patient that it would take 30 minutes to infuse it.Patient refused for it and the rest of her medicines.

## 2021-07-22 NOTE — Progress Notes (Signed)
BSC delivered into his room.

## 2021-07-23 ENCOUNTER — Other Ambulatory Visit (HOSPITAL_COMMUNITY): Payer: Self-pay | Admitting: Nephrology

## 2021-07-23 ENCOUNTER — Telehealth: Payer: Self-pay

## 2021-07-23 DIAGNOSIS — Z992 Dependence on renal dialysis: Secondary | ICD-10-CM | POA: Diagnosis not present

## 2021-07-23 DIAGNOSIS — N186 End stage renal disease: Secondary | ICD-10-CM

## 2021-07-23 DIAGNOSIS — R7881 Bacteremia: Secondary | ICD-10-CM | POA: Diagnosis not present

## 2021-07-23 DIAGNOSIS — E875 Hyperkalemia: Secondary | ICD-10-CM | POA: Diagnosis not present

## 2021-07-23 DIAGNOSIS — D689 Coagulation defect, unspecified: Secondary | ICD-10-CM | POA: Diagnosis not present

## 2021-07-23 DIAGNOSIS — N2581 Secondary hyperparathyroidism of renal origin: Secondary | ICD-10-CM | POA: Diagnosis not present

## 2021-07-23 LAB — CULTURE, BLOOD (ROUTINE X 2)
Culture: NO GROWTH
Culture: NO GROWTH
Special Requests: ADEQUATE
Special Requests: ADEQUATE

## 2021-07-23 NOTE — Telephone Encounter (Signed)
Transition Care Management Follow-up Telephone Call Date of discharge and from where: [facility]  How have you been since you were released from the hospital? Pt states she has been sluggish. In regards to her appointment, she wanted to keep 08/09/2021. Being that pt reported many other appointments that she had to make this week and the week to come.  Any questions or concerns? No  Items Reviewed: Did the pt receive and understand the discharge instructions provided? Yes  Medications obtained and verified? Yes  Other? No  Any new allergies since your discharge? No  Dietary orders reviewed? Yes Do you have support at home? Yes   Home Care and Equipment/Supplies: Were home health services ordered? not applicable If so, what is the name of the agency? N/a   Has the agency set up a time to come to the patient's home? not applicable Were any new equipment or medical supplies ordered?  No What is the name of the medical supply agency? N/a  Were you able to get the supplies/equipment? not applicable Do you have any questions related to the use of the equipment or supplies? No  Functional Questionnaire: (I = Independent and D = Dependent) ADLs: I  Bathing/Dressing- I  Meal Prep- I  Eating- I  Maintaining continence- I  Transferring/Ambulation- I  Managing Meds- I  Follow up appointments reviewed:  PCP Hospital f/u appt confirmed? Yes  Scheduled to see [name] on 08/09/2021 @ [facility] 11:00am. [facility] f/u appt confirmed? No  Scheduled to see n/a  on n/a  @ n/a. Are transportation arrangements needed? No  If their condition worsens, is the pt aware to call PCP or go to the Emergency Dept.? Yes Was the patient provided with contact information for the PCP's office or ED? Yes Was to pt encouraged to call back with questions or concerns? Yes

## 2021-07-24 ENCOUNTER — Other Ambulatory Visit: Payer: Self-pay

## 2021-07-24 ENCOUNTER — Ambulatory Visit (INDEPENDENT_AMBULATORY_CARE_PROVIDER_SITE_OTHER): Payer: Medicare Other | Admitting: Vascular Surgery

## 2021-07-24 ENCOUNTER — Ambulatory Visit (HOSPITAL_COMMUNITY)
Admission: RE | Admit: 2021-07-24 | Discharge: 2021-07-24 | Disposition: A | Discharge status: Home / Self Care | Payer: Medicare Other | Source: Ambulatory Visit | Attending: Vascular Surgery | Admitting: Vascular Surgery

## 2021-07-24 ENCOUNTER — Encounter: Payer: Self-pay | Admitting: Vascular Surgery

## 2021-07-24 VITALS — BP 157/84 | HR 87 | Temp 97.4°F | Resp 16 | Ht 61.0 in | Wt 202.0 lb

## 2021-07-24 DIAGNOSIS — Z992 Dependence on renal dialysis: Secondary | ICD-10-CM | POA: Diagnosis not present

## 2021-07-24 DIAGNOSIS — N186 End stage renal disease: Secondary | ICD-10-CM | POA: Diagnosis not present

## 2021-07-24 DIAGNOSIS — I739 Peripheral vascular disease, unspecified: Secondary | ICD-10-CM

## 2021-07-24 LAB — CULTURE, BLOOD (ROUTINE X 2)
Culture: NO GROWTH
Culture: NO GROWTH

## 2021-07-24 NOTE — Progress Notes (Signed)
Patient name: Whitney Walker MRN: [id number] DOB: [birth year] Sex: female  REASON FOR CONSULT: Evaluate left hand numbness/paresthesia  HPI: Whitney Walker is a 65 y.o. female, with multiple medical problems including end-stage renal disease that vascular surgery has been asked to evaluate left hand numbness and paresthesia.  Patient states she has had numbness in the left forearm into the hand that started initially about 3 to 4 weeks ago.  This has subsequently improved significantly over the last week.  She has a left arm basilic fistula placed by Dr. Donzetta Matters in 2019.  She states she does not use it because it infiltrates and she does not want any access in her right arm.  She is currently catheter dependent and has been using a left IJ TDC for many months.  She denies any weakness in the hand.  Past Medical History:  Diagnosis Date   Anemia    Anxiety    CHF (congestive heart failure) ([place])    Colon cancer ([place])    treatment surgery   Complication of anesthesia    after first C- [place] "couldnt walk after", patient denies having a spinal   COPD (chronic obstructive pulmonary disease) ([place])    Coronary artery disease    Depression    DVT (deep venous thrombosis) ([facility])    ESRD (end stage renal disease) ([place])    Hemo: MWF   History of blood transfusion 04/2018   Hypertension    07/07/18- no longer takes BP medications   Meningitis    Pain in limb 07/30/2013   PE (pulmonary embolism)    Peripheral vascular disease ([facility])    Restless legs    Shortness of breath    with exertion   Sleep apnea    SOB (shortness of breath) 03/03/2019   Vertigo     Past Surgical History:  Procedure Laterality Date   ABDOMINAL AORTAGRAM N/A 07/26/2013   Procedure: ABDOMINAL Maxcine Ham;  Surgeon: [name] [place], MD;  Location: [facility] CATH LAB;  Service: Cardiovascular;  Laterality: N/A;   ABDOMINAL HYSTERECTOMY     AV FISTULA PLACEMENT Left 05/05/2018   Procedure: ARTERIOVENOUS (AV) FISTULA CREATION  BRACHIOCEPHALIC;  Surgeon: Waynetta Sandy, MD;  Location: [place];  Service: Vascular;  Laterality: Left;   AV FISTULA PLACEMENT Left 07/16/2018   Procedure: ARTERIOVENOUS FISTULA CREATION;  Surgeon: Waynetta Sandy, MD;  Location: [place];  Service: Vascular;  Laterality: Left;   [place] Left 09/03/2018   Procedure: BASILIC VEIN TRANSPOSITION SECOND STAGE;  Surgeon: Waynetta Sandy, MD;  Location: [place];  Service: Vascular;  Laterality: Left;   BREAST BIOPSY Left    CESAREAN SECTION     X 3 1974-1977   CHOLECYSTECTOMY     CHOLECYSTECTOMY  1980/s   COLECTOMY  2010   DIVERTICULOSIS SURGERY-2002  2012   FEMORAL-POPLITEAL BYPASS GRAFT Left 08/13/2013   Procedure: BYPASS GRAFT FEMORAL-POPLITEAL ARTERY WITH NON-REVERSED SAPHANEOUS VEIN; ULTRASOUND GUIDED;  Surgeon: Mal [name], MD;  Location: [place];  Service: Vascular;  Laterality: Left;   INSERTION OF DIALYSIS CATHETER Right 04/30/2018   Procedure: INSERTION OF Right Internal Jugular DIALYSIS CATHETER;  Surgeon: [name], MD;  Location: [facility] OR;  Service: Vascular;  Laterality: Right;   IR FLUORO GUIDE CV LINE RIGHT  07/24/2020   IR FLUORO GUIDE CV LINE RIGHT  07/21/2021   IR REMOVAL TUN CV CATH W/O FL  07/21/2020   IR REMOVAL TUN CV CATH W/O FL  07/19/2021  IR US GUIDE VASC ACCESS RIGHT  07/24/2020   IR US GUIDE VASC ACCESS RIGHT  07/21/2021   LEFT HEART CATH AND CORONARY ANGIOGRAPHY N/A 05/01/2021   Procedure: LEFT HEART CATH AND CORONARY ANGIOGRAPHY;  Surgeon: [name], MD;  Location: [place] CV LAB;  Service: Cardiovascular;  Laterality: N/A;   LOWER EXTREMITY ANGIOGRAM Left 07/26/2013   Procedure: LOWER EXTREMITY ANGIOGRAM;  Surgeon: [name] [place], MD;  Location: [facility] CATH LAB;  Service: Cardiovascular;  Laterality: Left;    Family History  Problem Relation Age of Onset   Cancer Mother 50       breast and bone   Cancer Father 29       prostate   Hypertension Sister    Bleeding Disorder  Sister    Cancer Cousin 34       breast cancer    Hypertension Daughter    Breast cancer Neg Hx     SOCIAL HISTORY: Social History   Socioeconomic History   Marital status: Single    Spouse name: Not on file   Number of children: 4   Years of education: 12th gd   Highest education level: Not on file  Occupational History   Occupation: N/A  Tobacco Use   Smoking status: Every Day    Packs/day: 0.50    Years: 50.00    Pack years: 25.00    Types: Cigarettes    Last attempt to quit: 02/03/2021    Years since quitting: 0.4   Smokeless tobacco: Former  Scientific laboratory technician Use: Never used  Substance and Sexual Activity   Alcohol use: No    Alcohol/week: 0.0 standard drinks    Comment: she used to drink alcohol, quit in 2010; h/o heavy use    Drug use: Yes    Types: Oxycodone   Sexual activity: Not Currently  Other Topics Concern   Not on file  Social History Narrative   2 cups of coffee a day    Social Determinants of Health   Financial Resource Strain: Low Risk    Difficulty of Paying Living Expenses: Not hard at all  Food Insecurity: No Food Insecurity   Worried About Charity fundraiser in the Last Year: Never true   [place] in the Last Year: Never true  Transportation Needs: No Transportation Needs   Lack of Transportation (Medical): No   Lack of Transportation (Non-Medical): No  Physical Activity: Inactive   Days of Exercise per Week: 0 days   Minutes of Exercise per Session: 0 min  Stress: No Stress Concern Present   Feeling of Stress : Not at all  Social Connections: Not on file  Intimate Partner Violence: Not on file    Allergies  Allergen Reactions   Carnosine     Other reaction(s): Unknown   Gadolinium Derivatives Hives and Other (See Comments)    HIVES, Desc: HIVES W/ "DYE" USED FOR 1ST CT SCAN BUT NOT 2ND, NO PREMEDS USED, PT UNCERTAIN OF CIRCUMSTANCES,,?POSSIBLE MRI CONTRAST ALLERGY, ALL STUDIES DONE "SOMEWHERE" IN [place]//A.C., Onset  Date: 70177939   Iohexol Other (See Comments)     Code: HIVES, Desc: HIVES W/ "DYE" USED FOR 1ST CT SCAN BUT NOT 2ND, NO PREMEDS USED, PT UNCERTAIN OF CIRCUMSTANCES,,?POSSIBLE MRI CONTRAST ALLERGY, ALL STUDIES DONE "SOMEWHERE" IN [place]//A.C., Onset Date: [id number]    Iodine Hives   Naltrexone     Other reaction(s): Unknown    Current Outpatient Medications  Medication Sig Dispense Refill  acetaminophen (TYLENOL) 500 MG tablet Take 1,000 mg by mouth 2 (two) times daily as needed for moderate pain or headache.     albuterol (VENTOLIN HFA) 108 (90 Base) MCG/ACT inhaler Inhale 2 puffs into the lungs every 6 (six) hours as needed for wheezing or shortness of breath. 18 g 0   apixaban (ELIQUIS) 2.5 MG TABS tablet Take 1 tablet (2.5 mg total) by mouth 2 (two) times daily. 180 tablet 3   Ascorbic Acid (VITAMIN C) 100 MG tablet Take 100 mg by mouth daily.     aspirin EC 81 MG EC tablet Take 1 tablet (81 mg total) by mouth daily. Swallow whole. 30 tablet 11   atorvastatin (LIPITOR) 10 MG tablet Take 1 tablet (10 mg total) by mouth daily. 90 tablet 3   AURYXIA 1 GM 210 MG(Fe) tablet Take 420-840 mg by mouth with breakfast, with lunch, and with evening meal. Taking  2 tablets daily (498m) with each snack     Biotin 10000 MCG TABS Take 10,000 mcg by mouth daily.      ceFAZolin (ANCEF) IVPB Inject 2 g into the vein every Monday, Wednesday, and Friday with hemodialysis. Indication:  bacteremia First Dose: No Last Day of Therapy:  08/29/21 Labs - Once weekly:  CBC/D and BMP, Labs - Every other week:  ESR and CRP Method of administration: IV Push Method of administration may be changed at the discretion of home infusion pharmacist based upon assessment of the patient and/or caregiver's ability to self-administer the medication ordered. 17 Units 0   docusate sodium (COLACE) 100 MG capsule Take 1 capsule (100 mg total) by mouth 2 (two) times daily as needed for mild constipation. 10 capsule 0    lidocaine (LIDODERM) 5 % Place 1 patch onto the skin daily as needed. Remove & Discard patch within 12 hours or as directed by MD 30 patch 0   meclizine (ANTIVERT) 12.5 MG tablet Take 1 tablet (12.5 mg total) by mouth 3 (three) times daily as needed for dizziness. 30 tablet 0   melatonin 3 MG TABS tablet Take 1 tablet (3 mg total) by mouth at bedtime as needed. 30 tablet 0   multivitamin (RENA-VIT) TABS tablet Take 1 tablet by mouth at bedtime.     mupirocin ointment (BACTROBAN) 2 % Apply 1 application topically daily as needed (skin bumps). 22 g 2   Nutritional Supplements (,FEEDING SUPPLEMENT, PROSOURCE PLUS) liquid Take 30 mLs by mouth 2 (two) times daily between meals. 60 mL 0   omeprazole (PRILOSEC) 20 MG capsule Take 1 capsule (20 mg total) by mouth daily. 30 capsule 1   polyethylene glycol (MIRALAX / GLYCOLAX) 17 g packet Take 17 g by mouth daily as needed for moderate constipation. 14 each 0   vitamin E 200 UNIT capsule Take 200 Units by mouth daily.     No current facility-administered medications for this visit.    REVIEW OF SYSTEMS:  [X]  denotes positive finding, [ ]  denotes negative finding Cardiac  Comments:  Chest pain or chest pressure:    Shortness of breath upon exertion:    Short of breath when lying flat:    Irregular heart rhythm:        Vascular    Pain in calf, thigh, or hip brought on by ambulation:    Pain in feet at night that wakes you up from your sleep:     Blood clot in your veins:    Leg swelling:  Pulmonary    Oxygen at home:    Productive cough:     Wheezing:         Neurologic    Sudden weakness in arms or legs:     Sudden numbness in arms or legs:     Sudden onset of difficulty speaking or slurred speech:    Temporary loss of vision in one eye:     Problems with dizziness:         Gastrointestinal    Blood in stool:     Vomited blood:         Genitourinary    Burning when urinating:     Blood in urine:        Psychiatric     Major depression:         Hematologic    Bleeding problems:    Problems with blood clotting too easily:        Skin    Rashes or ulcers:        Constitutional    Fever or chills:      PHYSICAL EXAM: Vitals:   07/24/21 1537  BP: (!) 157/84  Pulse: 87  Resp: 16  Temp: (!) 97.4 F (36.3 C)  TempSrc: Temporal  SpO2: 97%  Weight: 202 lb (91.6 kg)  Height: 5' 1"  (1.549 m)    GENERAL: The patient is a well-nourished female, in no acute distress. The vital signs are documented above. CARDIAC: There is a regular rate and rhythm.  VASCULAR:  Left radial pulse palpable at the wrist, hand warm Cannot appreciate a thrill in the left arm AV fistula PULMONARY: No respiratory distress. ABDOMEN: Soft and non-tender. MUSCULOSKELETAL: There are no major deformities or cyanosis. NEUROLOGIC: No focal weakness or paresthesias are detected. SKIN: There are no ulcers or rashes noted. PSYCHIATRIC: The patient has a normal affect.  DATA:   Left upper extremity steal study shows no significant change in left second digit with compression of the fistula.  Assessment/Plan:  65 year old female presents for evaluation of pain and numbness in the left hand.  I discussed with her in detail that the underlying concern would be steal syndrome given left arm AVF.  That being said I really cannot appreciate any thrill on exam in the fistula and appears either thrombosed or to have very little flow through it.  She has an easily palpable radial pulse at the wrist.  Her steal study was essentially normal showing no significant increase in digital pressure with compression of the fistula.  Ultimately sounds like her symptoms have improved significantly over the last week.  I did discuss with her the other option would be referral to hand surgery to rule out underlying carpal tunnel and other etiologies for her hand numbness.  Ultimately she wants to take a wait-and-see approach given she does feel things are  much better.  Discussed she contact us with ongoing concerns.   Marty Heck, MD [facility] Office: [phone]

## 2021-07-25 DIAGNOSIS — E875 Hyperkalemia: Secondary | ICD-10-CM | POA: Diagnosis not present

## 2021-07-25 DIAGNOSIS — N2581 Secondary hyperparathyroidism of renal origin: Secondary | ICD-10-CM | POA: Diagnosis not present

## 2021-07-25 DIAGNOSIS — N186 End stage renal disease: Secondary | ICD-10-CM | POA: Diagnosis not present

## 2021-07-25 DIAGNOSIS — R7881 Bacteremia: Secondary | ICD-10-CM | POA: Diagnosis not present

## 2021-07-25 DIAGNOSIS — D689 Coagulation defect, unspecified: Secondary | ICD-10-CM | POA: Diagnosis not present

## 2021-07-25 DIAGNOSIS — Z992 Dependence on renal dialysis: Secondary | ICD-10-CM | POA: Diagnosis not present

## 2021-07-26 ENCOUNTER — Other Ambulatory Visit: Payer: Self-pay | Admitting: Student

## 2021-07-26 DIAGNOSIS — Z85038 Personal history of other malignant neoplasm of large intestine: Secondary | ICD-10-CM | POA: Diagnosis not present

## 2021-07-26 DIAGNOSIS — Z86718 Personal history of other venous thrombosis and embolism: Secondary | ICD-10-CM | POA: Diagnosis not present

## 2021-07-26 DIAGNOSIS — Z9181 History of falling: Secondary | ICD-10-CM | POA: Diagnosis not present

## 2021-07-26 DIAGNOSIS — I5032 Chronic diastolic (congestive) heart failure: Secondary | ICD-10-CM | POA: Diagnosis not present

## 2021-07-26 DIAGNOSIS — Z86711 Personal history of pulmonary embolism: Secondary | ICD-10-CM | POA: Diagnosis not present

## 2021-07-26 DIAGNOSIS — I700 Atherosclerosis of aorta: Secondary | ICD-10-CM

## 2021-07-26 DIAGNOSIS — D631 Anemia in chronic kidney disease: Secondary | ICD-10-CM | POA: Diagnosis not present

## 2021-07-26 DIAGNOSIS — M545 Low back pain, unspecified: Secondary | ICD-10-CM | POA: Diagnosis not present

## 2021-07-26 DIAGNOSIS — G2581 Restless legs syndrome: Secondary | ICD-10-CM | POA: Diagnosis not present

## 2021-07-26 DIAGNOSIS — R918 Other nonspecific abnormal finding of lung field: Secondary | ICD-10-CM | POA: Diagnosis not present

## 2021-07-26 DIAGNOSIS — I739 Peripheral vascular disease, unspecified: Secondary | ICD-10-CM | POA: Diagnosis not present

## 2021-07-26 DIAGNOSIS — N186 End stage renal disease: Secondary | ICD-10-CM | POA: Diagnosis not present

## 2021-07-26 DIAGNOSIS — Z7982 Long term (current) use of aspirin: Secondary | ICD-10-CM | POA: Diagnosis not present

## 2021-07-26 DIAGNOSIS — Z7901 Long term (current) use of anticoagulants: Secondary | ICD-10-CM | POA: Diagnosis not present

## 2021-07-26 DIAGNOSIS — R768 Other specified abnormal immunological findings in serum: Secondary | ICD-10-CM | POA: Diagnosis not present

## 2021-07-26 DIAGNOSIS — M479 Spondylosis, unspecified: Secondary | ICD-10-CM | POA: Diagnosis not present

## 2021-07-26 DIAGNOSIS — L52 Erythema nodosum: Secondary | ICD-10-CM | POA: Diagnosis not present

## 2021-07-26 DIAGNOSIS — I2510 Atherosclerotic heart disease of native coronary artery without angina pectoris: Secondary | ICD-10-CM | POA: Diagnosis not present

## 2021-07-26 DIAGNOSIS — G4730 Sleep apnea, unspecified: Secondary | ICD-10-CM | POA: Diagnosis not present

## 2021-07-26 DIAGNOSIS — I6523 Occlusion and stenosis of bilateral carotid arteries: Secondary | ICD-10-CM

## 2021-07-26 DIAGNOSIS — Z992 Dependence on renal dialysis: Secondary | ICD-10-CM | POA: Diagnosis not present

## 2021-07-26 DIAGNOSIS — I132 Hypertensive heart and chronic kidney disease with heart failure and with stage 5 chronic kidney disease, or end stage renal disease: Secondary | ICD-10-CM | POA: Diagnosis not present

## 2021-07-26 DIAGNOSIS — J449 Chronic obstructive pulmonary disease, unspecified: Secondary | ICD-10-CM | POA: Diagnosis not present

## 2021-07-26 DIAGNOSIS — A4101 Sepsis due to Methicillin susceptible Staphylococcus aureus: Secondary | ICD-10-CM | POA: Diagnosis not present

## 2021-07-27 ENCOUNTER — Telehealth: Payer: Self-pay

## 2021-07-27 DIAGNOSIS — A4101 Sepsis due to Methicillin susceptible Staphylococcus aureus: Secondary | ICD-10-CM | POA: Diagnosis not present

## 2021-07-27 DIAGNOSIS — J449 Chronic obstructive pulmonary disease, unspecified: Secondary | ICD-10-CM | POA: Diagnosis not present

## 2021-07-27 DIAGNOSIS — Z7982 Long term (current) use of aspirin: Secondary | ICD-10-CM | POA: Diagnosis not present

## 2021-07-27 DIAGNOSIS — I2510 Atherosclerotic heart disease of native coronary artery without angina pectoris: Secondary | ICD-10-CM | POA: Diagnosis not present

## 2021-07-27 DIAGNOSIS — M479 Spondylosis, unspecified: Secondary | ICD-10-CM | POA: Diagnosis not present

## 2021-07-27 DIAGNOSIS — Z9181 History of falling: Secondary | ICD-10-CM | POA: Diagnosis not present

## 2021-07-27 DIAGNOSIS — E875 Hyperkalemia: Secondary | ICD-10-CM | POA: Diagnosis not present

## 2021-07-27 DIAGNOSIS — G2581 Restless legs syndrome: Secondary | ICD-10-CM | POA: Diagnosis not present

## 2021-07-27 DIAGNOSIS — Z86711 Personal history of pulmonary embolism: Secondary | ICD-10-CM | POA: Diagnosis not present

## 2021-07-27 DIAGNOSIS — I132 Hypertensive heart and chronic kidney disease with heart failure and with stage 5 chronic kidney disease, or end stage renal disease: Secondary | ICD-10-CM | POA: Diagnosis not present

## 2021-07-27 DIAGNOSIS — Z85038 Personal history of other malignant neoplasm of large intestine: Secondary | ICD-10-CM | POA: Diagnosis not present

## 2021-07-27 DIAGNOSIS — I5032 Chronic diastolic (congestive) heart failure: Secondary | ICD-10-CM | POA: Diagnosis not present

## 2021-07-27 DIAGNOSIS — Z992 Dependence on renal dialysis: Secondary | ICD-10-CM | POA: Diagnosis not present

## 2021-07-27 DIAGNOSIS — D631 Anemia in chronic kidney disease: Secondary | ICD-10-CM | POA: Diagnosis not present

## 2021-07-27 DIAGNOSIS — N186 End stage renal disease: Secondary | ICD-10-CM | POA: Diagnosis not present

## 2021-07-27 DIAGNOSIS — I739 Peripheral vascular disease, unspecified: Secondary | ICD-10-CM | POA: Diagnosis not present

## 2021-07-27 DIAGNOSIS — Z86718 Personal history of other venous thrombosis and embolism: Secondary | ICD-10-CM | POA: Diagnosis not present

## 2021-07-27 DIAGNOSIS — N2581 Secondary hyperparathyroidism of renal origin: Secondary | ICD-10-CM | POA: Diagnosis not present

## 2021-07-27 DIAGNOSIS — R918 Other nonspecific abnormal finding of lung field: Secondary | ICD-10-CM | POA: Diagnosis not present

## 2021-07-27 DIAGNOSIS — D689 Coagulation defect, unspecified: Secondary | ICD-10-CM | POA: Diagnosis not present

## 2021-07-27 DIAGNOSIS — Z7901 Long term (current) use of anticoagulants: Secondary | ICD-10-CM | POA: Diagnosis not present

## 2021-07-27 DIAGNOSIS — G4730 Sleep apnea, unspecified: Secondary | ICD-10-CM | POA: Diagnosis not present

## 2021-07-27 DIAGNOSIS — R7881 Bacteremia: Secondary | ICD-10-CM | POA: Diagnosis not present

## 2021-07-27 NOTE — Telephone Encounter (Signed)
Called pt trying to get in an appt before 8/11 being we had openings before then. She declined, stating she had upcoming appointments. Pt also requested that appointment 8/16, to be cancelled. Being that she did not want to come back to back.

## 2021-07-29 DIAGNOSIS — Z992 Dependence on renal dialysis: Secondary | ICD-10-CM | POA: Diagnosis not present

## 2021-07-29 DIAGNOSIS — I129 Hypertensive chronic kidney disease with stage 1 through stage 4 chronic kidney disease, or unspecified chronic kidney disease: Secondary | ICD-10-CM | POA: Diagnosis not present

## 2021-07-29 DIAGNOSIS — N186 End stage renal disease: Secondary | ICD-10-CM | POA: Diagnosis not present

## 2021-07-30 DIAGNOSIS — D631 Anemia in chronic kidney disease: Secondary | ICD-10-CM | POA: Diagnosis not present

## 2021-07-30 DIAGNOSIS — R7881 Bacteremia: Secondary | ICD-10-CM | POA: Diagnosis not present

## 2021-07-30 DIAGNOSIS — D689 Coagulation defect, unspecified: Secondary | ICD-10-CM | POA: Diagnosis not present

## 2021-07-30 DIAGNOSIS — Z992 Dependence on renal dialysis: Secondary | ICD-10-CM | POA: Diagnosis not present

## 2021-07-30 DIAGNOSIS — E875 Hyperkalemia: Secondary | ICD-10-CM | POA: Diagnosis not present

## 2021-07-30 DIAGNOSIS — N186 End stage renal disease: Secondary | ICD-10-CM | POA: Diagnosis not present

## 2021-07-30 DIAGNOSIS — N2581 Secondary hyperparathyroidism of renal origin: Secondary | ICD-10-CM | POA: Diagnosis not present

## 2021-07-31 DIAGNOSIS — N186 End stage renal disease: Secondary | ICD-10-CM | POA: Diagnosis not present

## 2021-07-31 DIAGNOSIS — A4101 Sepsis due to Methicillin susceptible Staphylococcus aureus: Secondary | ICD-10-CM | POA: Diagnosis not present

## 2021-07-31 DIAGNOSIS — Z7901 Long term (current) use of anticoagulants: Secondary | ICD-10-CM | POA: Diagnosis not present

## 2021-07-31 DIAGNOSIS — M479 Spondylosis, unspecified: Secondary | ICD-10-CM | POA: Diagnosis not present

## 2021-07-31 DIAGNOSIS — I739 Peripheral vascular disease, unspecified: Secondary | ICD-10-CM | POA: Diagnosis not present

## 2021-07-31 DIAGNOSIS — D631 Anemia in chronic kidney disease: Secondary | ICD-10-CM | POA: Diagnosis not present

## 2021-07-31 DIAGNOSIS — Z9181 History of falling: Secondary | ICD-10-CM | POA: Diagnosis not present

## 2021-07-31 DIAGNOSIS — I2510 Atherosclerotic heart disease of native coronary artery without angina pectoris: Secondary | ICD-10-CM | POA: Diagnosis not present

## 2021-07-31 DIAGNOSIS — Z86718 Personal history of other venous thrombosis and embolism: Secondary | ICD-10-CM | POA: Diagnosis not present

## 2021-07-31 DIAGNOSIS — Z85038 Personal history of other malignant neoplasm of large intestine: Secondary | ICD-10-CM | POA: Diagnosis not present

## 2021-07-31 DIAGNOSIS — Z7982 Long term (current) use of aspirin: Secondary | ICD-10-CM | POA: Diagnosis not present

## 2021-07-31 DIAGNOSIS — Z86711 Personal history of pulmonary embolism: Secondary | ICD-10-CM | POA: Diagnosis not present

## 2021-07-31 DIAGNOSIS — I5032 Chronic diastolic (congestive) heart failure: Secondary | ICD-10-CM | POA: Diagnosis not present

## 2021-07-31 DIAGNOSIS — J449 Chronic obstructive pulmonary disease, unspecified: Secondary | ICD-10-CM | POA: Diagnosis not present

## 2021-07-31 DIAGNOSIS — G2581 Restless legs syndrome: Secondary | ICD-10-CM | POA: Diagnosis not present

## 2021-07-31 DIAGNOSIS — G4730 Sleep apnea, unspecified: Secondary | ICD-10-CM | POA: Diagnosis not present

## 2021-07-31 DIAGNOSIS — I132 Hypertensive heart and chronic kidney disease with heart failure and with stage 5 chronic kidney disease, or end stage renal disease: Secondary | ICD-10-CM | POA: Diagnosis not present

## 2021-07-31 DIAGNOSIS — R918 Other nonspecific abnormal finding of lung field: Secondary | ICD-10-CM | POA: Diagnosis not present

## 2021-07-31 DIAGNOSIS — Z992 Dependence on renal dialysis: Secondary | ICD-10-CM | POA: Diagnosis not present

## 2021-08-01 ENCOUNTER — Telehealth: Payer: Self-pay

## 2021-08-01 DIAGNOSIS — R7881 Bacteremia: Secondary | ICD-10-CM | POA: Diagnosis not present

## 2021-08-01 DIAGNOSIS — Z992 Dependence on renal dialysis: Secondary | ICD-10-CM | POA: Diagnosis not present

## 2021-08-01 DIAGNOSIS — E875 Hyperkalemia: Secondary | ICD-10-CM | POA: Diagnosis not present

## 2021-08-01 DIAGNOSIS — N186 End stage renal disease: Secondary | ICD-10-CM | POA: Diagnosis not present

## 2021-08-01 DIAGNOSIS — D631 Anemia in chronic kidney disease: Secondary | ICD-10-CM | POA: Diagnosis not present

## 2021-08-01 DIAGNOSIS — N2581 Secondary hyperparathyroidism of renal origin: Secondary | ICD-10-CM | POA: Diagnosis not present

## 2021-08-01 DIAGNOSIS — D689 Coagulation defect, unspecified: Secondary | ICD-10-CM | POA: Diagnosis not present

## 2021-08-01 NOTE — Chronic Care Management (AMB) (Signed)
Chronic Care Management Pharmacy Assistant   Name: Whitney Walker  MRN: [id number] DOB: Feb 20, 1956   Reason for Encounter: Medication Review/ Medication coordination  Recent office visits:  None  Recent consult visits:  07-24-2021 Marty Heck, MD. Follow up on peripheral artery disease.  Hospital visits:  Medication Reconciliation was completed by comparing discharge summary, patient's EMR and Pharmacy list, and upon discussion with patient.  Admitted to the hospital on 06-30-2021 due to Sepsis. Discharge date was 07-22-2021. Discharged from [place]?Medications Started at [facility] Discharge:?? Aspirin 81 mg daily Ancef inject 2 g into the vein every Monday, Wednesday and Friday with hemodialysis  Colace 100 mg twice daily as needed for mild constipation Lidoderm 5% one patch onto skin daily as needed. Discard patch after 12 hours Melatonin 3 mg at bedtime as needed Miralax 17 g as needed for constipation ProSource Plus  Medication Changes at Hospital Discharge: None  Medications Discontinued at Hospital Discharge: Amlodipine 5 mg Carvedilol 12.5 mg Nystatin powder Prednisone 10 mg  Medications that remain the same after Hospital Discharge:??  -All other medications will remain the same.     Hospital visits:  Medication Reconciliation was completed by comparing discharge summary, patient's EMR and Pharmacy list, and upon discussion with patient.   Admitted to the hospital on 05-28-2021 due to Hyperkalemia . Discharge date was 05-31-2021. Discharged from [place]?Medications Started at [facility] Discharge:?? Prednisone 10 MG- 4 tablets for 6 days, 3 tablets for 6 days, 2 tablets for 6 days then 1 tablet for 2 days   Medication Changes at Hospital Discharge: None   Medications Discontinued at Hospital Discharge: None   Admitted to the hospital on 05-01-2021 due to Acute deep vein thrombosis of femoral vein of  left lower extremity. Discharge date was 05-01-2021. Discharged from [place]?Medications Started at [facility] Discharge:?? None   Medication Changes at Hospital Discharge: None   Medications Discontinued at Hospital Discharge: -Stopped Amoxicillin 500 MG. Stopped Ibuprofen 800 MG   Medications that remain the same after Hospital Discharge:?? -All other medications will remain the same Medications: Outpatient Encounter Medications as of 08/01/2021  Medication Sig   acetaminophen (TYLENOL) 500 MG tablet Take 1,000 mg by mouth 2 (two) times daily as needed for moderate pain or headache.   albuterol (VENTOLIN HFA) 108 (90 Base) MCG/ACT inhaler Inhale 2 puffs into the lungs every 6 (six) hours as needed for wheezing or shortness of breath.   apixaban (ELIQUIS) 2.5 MG TABS tablet Take 1 tablet (2.5 mg total) by mouth 2 (two) times daily.   Ascorbic Acid (VITAMIN C) 100 MG tablet Take 100 mg by mouth daily.   aspirin EC 81 MG EC tablet Take 1 tablet (81 mg total) by mouth daily. Swallow whole.   atorvastatin (LIPITOR) 10 MG tablet Take 1 tablet (10 mg total) by mouth daily.   atorvastatin (LIPITOR) 20 MG tablet TAKE ONE TABLET BY MOUTH ONCE DAILY   AURYXIA 1 GM 210 MG(Fe) tablet Take 420-840 mg by mouth with breakfast, with lunch, and with evening meal. Taking  2 tablets daily (44m) with each snack   Biotin 10000 MCG TABS Take 10,000 mcg by mouth daily.    ceFAZolin (ANCEF) IVPB Inject 2 g into the vein every Monday, Wednesday, and Friday with hemodialysis. Indication:  bacteremia First Dose: No Last Day of Therapy:  08/29/21 Labs - Once weekly:  CBC/D and BMP,  Labs - Every other week:  ESR and CRP Method of administration: IV Push Method of administration may be changed at the discretion of home infusion pharmacist based upon assessment of the patient and/or caregiver's ability to self-administer the medication ordered.   docusate sodium (COLACE) 100 MG capsule Take 1  capsule (100 mg total) by mouth 2 (two) times daily as needed for mild constipation.   lidocaine (LIDODERM) 5 % Place 1 patch onto the skin daily as needed. Remove & Discard patch within 12 hours or as directed by MD   meclizine (ANTIVERT) 12.5 MG tablet Take 1 tablet (12.5 mg total) by mouth 3 (three) times daily as needed for dizziness.   melatonin 3 MG TABS tablet Take 1 tablet (3 mg total) by mouth at bedtime as needed.   multivitamin (RENA-VIT) TABS tablet Take 1 tablet by mouth at bedtime.   mupirocin ointment (BACTROBAN) 2 % Apply 1 application topically daily as needed (skin bumps).   Nutritional Supplements (,FEEDING SUPPLEMENT, PROSOURCE PLUS) liquid Take 30 mLs by mouth 2 (two) times daily between meals.   omeprazole (PRILOSEC) 20 MG capsule Take 1 capsule (20 mg total) by mouth daily.   polyethylene glycol (MIRALAX / GLYCOLAX) 17 g packet Take 17 g by mouth daily as needed for moderate constipation.   vitamin E 200 UNIT capsule Take 200 Units by mouth daily.   No facility-administered encounter medications on file as of 08/01/2021.   Reviewed chart for medication changes ahead of medication coordination call.  No OVs, Consults, or hospital visits since last care coordination call/Pharmacist visit. (If appropriate, list visit date, provider name)  No medication changes indicated OR if recent visit, treatment plan here.  BP Readings from Last 3 Encounters:  07/24/21 (!) 157/84  07/22/21 134/80  06/21/21 127/73    Lab Results  Component Value Date   HGBA1C 6.2 (H) 07/16/2021     Patient obtains medications through Vials  30 Days   Last adherence delivery included:  Amlodipine 5 mg- 1 tablets daily Atorvastatin 10 mg- 1 tablet daily Coordinated future acute fill forms for Carvedilol 12.5 mg to be delivered on 06/15/2021 and Eliquis 2.5 mg to be delivered on 06/20/2021.  Patient declined (meds) last month: Albuterol inhaler 108 mcg- inhale 2 puffs every 6 hours due to PRN  use. Lunesta 2 mg- 1 tablet nightly due to not helping with Insomnia patient discontinued Nystatin 100 gm- apply four times a day as needed. Meclizine 12.5 mg- 1 tablet three times a day for dizziness due to PRN use.  Ondansetron 4 mg-  Dissolve 1 tablet under tongue every 6 hours due to PRN use. Azithromycin 250 mg- due to completed course. Amoxicillin 500 mg- due to completed course. Atorvastatin 20 mg- due to no longer taking this strength, patient taking 10 mg- requested new prescription. Ibuprofen 800 mg- 1 tablet by mouth three times daily due to completing course per Celeste Cantwell (Cardiology) Pantoprazole 40 mg- 1 tablet by mouth daily due to completing course per Celeste Cantwell (Cardiology) Explanation of abundance on hand (ie #30 due to overlapping fills or previous adherence issues etc)  Patient is due for next adherence delivery on: 08-09-2021 Called patient and reviewed medications and coordinated delivery.  This delivery to include: Atorvastatin 10 mg- 1 tablet daily Eliquis 2.5 mg twice daily Omeprazole 20 mg daily  No short/acute fill needed  Patient declined the following medications: Amlodipine- Discontinued Coreg- Discontinued  Patient needs refills for: Eliquis 2.5 mg- Dr. Feng Omeprazole 20 mg-   Dr. Feng Atorvastatin 10 mg- PCP  Confirmed delivery date of 08-09-2021 advised patient that pharmacy will contact them the morning of delivery.   Care Gaps: Shingrix overdue Medicare wellness 08-09-2021  Star Rating Drugs: Atorvastatin 20 mg- Last filled 07-06-2021 30 [facility]  [name] CMA Clinical Pharmacist Assistant [phone]  

## 2021-08-03 DIAGNOSIS — E875 Hyperkalemia: Secondary | ICD-10-CM | POA: Diagnosis not present

## 2021-08-03 DIAGNOSIS — D631 Anemia in chronic kidney disease: Secondary | ICD-10-CM | POA: Diagnosis not present

## 2021-08-03 DIAGNOSIS — N2581 Secondary hyperparathyroidism of renal origin: Secondary | ICD-10-CM | POA: Diagnosis not present

## 2021-08-03 DIAGNOSIS — R7881 Bacteremia: Secondary | ICD-10-CM | POA: Diagnosis not present

## 2021-08-03 DIAGNOSIS — D689 Coagulation defect, unspecified: Secondary | ICD-10-CM | POA: Diagnosis not present

## 2021-08-03 DIAGNOSIS — Z992 Dependence on renal dialysis: Secondary | ICD-10-CM | POA: Diagnosis not present

## 2021-08-03 DIAGNOSIS — N186 End stage renal disease: Secondary | ICD-10-CM | POA: Diagnosis not present

## 2021-08-06 DIAGNOSIS — N186 End stage renal disease: Secondary | ICD-10-CM | POA: Diagnosis not present

## 2021-08-06 DIAGNOSIS — N2581 Secondary hyperparathyroidism of renal origin: Secondary | ICD-10-CM | POA: Diagnosis not present

## 2021-08-06 DIAGNOSIS — E875 Hyperkalemia: Secondary | ICD-10-CM | POA: Diagnosis not present

## 2021-08-06 DIAGNOSIS — D631 Anemia in chronic kidney disease: Secondary | ICD-10-CM | POA: Diagnosis not present

## 2021-08-06 DIAGNOSIS — R7881 Bacteremia: Secondary | ICD-10-CM | POA: Diagnosis not present

## 2021-08-06 DIAGNOSIS — Z992 Dependence on renal dialysis: Secondary | ICD-10-CM | POA: Diagnosis not present

## 2021-08-06 DIAGNOSIS — D689 Coagulation defect, unspecified: Secondary | ICD-10-CM | POA: Diagnosis not present

## 2021-08-08 ENCOUNTER — Encounter: Payer: Self-pay | Admitting: Internal Medicine

## 2021-08-08 ENCOUNTER — Other Ambulatory Visit: Payer: Self-pay

## 2021-08-08 ENCOUNTER — Ambulatory Visit: Payer: Medicare Other | Admitting: Nurse Practitioner

## 2021-08-08 ENCOUNTER — Telehealth: Payer: Self-pay

## 2021-08-08 ENCOUNTER — Ambulatory Visit (INDEPENDENT_AMBULATORY_CARE_PROVIDER_SITE_OTHER): Payer: Medicare Other | Admitting: Internal Medicine

## 2021-08-08 VITALS — BP 98/65 | HR 103 | Temp 98.1°F | Ht 61.0 in | Wt 199.0 lb

## 2021-08-08 DIAGNOSIS — K219 Gastro-esophageal reflux disease without esophagitis: Secondary | ICD-10-CM

## 2021-08-08 DIAGNOSIS — B9561 Methicillin susceptible Staphylococcus aureus infection as the cause of diseases classified elsewhere: Secondary | ICD-10-CM | POA: Diagnosis not present

## 2021-08-08 DIAGNOSIS — N186 End stage renal disease: Secondary | ICD-10-CM | POA: Diagnosis not present

## 2021-08-08 DIAGNOSIS — E875 Hyperkalemia: Secondary | ICD-10-CM | POA: Diagnosis not present

## 2021-08-08 DIAGNOSIS — N2581 Secondary hyperparathyroidism of renal origin: Secondary | ICD-10-CM | POA: Diagnosis not present

## 2021-08-08 DIAGNOSIS — D631 Anemia in chronic kidney disease: Secondary | ICD-10-CM | POA: Diagnosis not present

## 2021-08-08 DIAGNOSIS — R7881 Bacteremia: Secondary | ICD-10-CM

## 2021-08-08 DIAGNOSIS — Z992 Dependence on renal dialysis: Secondary | ICD-10-CM | POA: Diagnosis not present

## 2021-08-08 DIAGNOSIS — I82412 Acute embolism and thrombosis of left femoral vein: Secondary | ICD-10-CM

## 2021-08-08 DIAGNOSIS — D689 Coagulation defect, unspecified: Secondary | ICD-10-CM | POA: Diagnosis not present

## 2021-08-08 MED ORDER — APIXABAN 2.5 MG PO TABS: 2.5000 mg | ORAL_TABLET | Freq: Two times a day (BID) | ORAL | 3 refills | Status: DC

## 2021-08-08 MED ORDER — OMEPRAZOLE 20 MG PO CPDR: 20.0000 mg | DELAYED_RELEASE_CAPSULE | Freq: Every day | ORAL | 1 refills | Status: DC

## 2021-08-08 NOTE — Telephone Encounter (Signed)
RN faxed orders for IV cefazolin to [facility] per Dr. Gale Journey with end date of 08/29/21.   Beryle Flock, RN

## 2021-08-08 NOTE — Patient Instructions (Signed)
Your infection is doing well  You'll need 6 weeks antibiotics until 08/29/2021  See me in 3-4 weeks again   With your back pain, please discuss with your primary doctor on pain management. This is something that will be chronic.

## 2021-08-08 NOTE — Progress Notes (Signed)
[facility]  Patient Active Problem List   Diagnosis Date Noted   Acute low back pain    MSSA bacteremia    Septic shock ([place]) 07/16/2021   Nausea and vomiting    Abdominal pain    FUO (fever of unknown origin)    Polyarthritis    Polyarthralgia    Hyperkalemia, diminished renal excretion 05/28/2021   Generalized weakness 05/28/2021   Rash and nonspecific skin eruption 05/28/2021   Atypical chest pain    Coronary artery calcification seen on CAT scan    Hyperkalemia 05/09/2020   Left leg pain 05/09/2020   Restless leg syndrome 09/19/2018   OSA (obstructive sleep apnea) 04/29/2018   ESRD (end stage renal disease) ([place]) 04/29/2018   Dyspnea on exertion 03/12/2017   Morbid (severe) obesity due to excess calories ([place]) 03/12/2017   Angina pectoris ([place]) 03/02/2017   Anemia of chronic kidney failure, stage 3 (moderate) ([place]) 12/10/2016   History of colon cancer 08/12/2016   Mass of left breast on mammogram 12/10/2015   Depression 10/18/2015   Numbness and tingling of left arm and leg 01/24/2015   PVD (peripheral vascular disease) with claudication ([place]) 03/23/2014   PAD (peripheral artery disease) ([place]) 12/14/2013   Chronic diastolic heart failure ([place]) 11/15/2013   Essential hypertension 11/15/2013   Leucocytosis 04/28/2012      Subjective:    Patient ID: Whitney Walker, female    DOB: Aug 20, 1956, 65 y.o.   MRN: [id number]  No chief complaint on file.   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 Due to no obvious reason for back pain, and 2 weeks sx of bacteremia, decision was to treat 6 weeks. Tte negative  She has been on cefazolin 2 gram, 2 gram, 3 gram with iHD  Doing well No  n/v/diarrhea/rash No f/c  She complains of lumbar back pain and paralumbar area. Not seeing pain clinic. Has lidocaine patch prescription but not covered by insurance   Allergies  Allergen Reactions   Carnosine     Other reaction(s): Unknown   Gadolinium Derivatives Hives and Other (See Comments)    HIVES, Desc: HIVES W/ "DYE" USED FOR 1ST CT SCAN BUT NOT 2ND, NO PREMEDS USED, PT UNCERTAIN OF CIRCUMSTANCES,,?POSSIBLE MRI CONTRAST ALLERGY, ALL STUDIES DONE "SOMEWHERE" IN [place]//A.C., Onset Date: [id number]   Iohexol Other (See Comments)     Code: HIVES, Desc: HIVES W/ "DYE" USED FOR 1ST CT SCAN BUT NOT 2ND, NO PREMEDS USED, PT UNCERTAIN OF CIRCUMSTANCES,,?POSSIBLE MRI CONTRAST ALLERGY, ALL STUDIES DONE "SOMEWHERE" IN [place]//A.C., Onset Date: [id number]    Iodine Hives   Naltrexone     Other reaction(s): Unknown      Outpatient Medications Prior to Visit  Medication Sig Dispense Refill   acetaminophen (TYLENOL) 500 MG tablet Take 1,000 mg by mouth 2 (two) times daily as needed for moderate pain or headache.     albuterol (VENTOLIN HFA) 108 (90 Base) MCG/ACT inhaler Inhale 2 puffs into the lungs every 6 (six) hours as needed for wheezing or shortness of breath. 18 g 0   apixaban (ELIQUIS) 2.5 MG TABS tablet Take 1 tablet (2.5 mg total) by mouth 2 (two) times daily. [place]  tablet 3   Ascorbic Acid (VITAMIN C) 100 MG tablet Take 100 mg by mouth daily.     aspirin EC 81 MG EC tablet Take 1 tablet (81 mg total) by mouth daily. Swallow whole. 30 tablet 11   atorvastatin (LIPITOR) 10 MG tablet Take 1 tablet (10 mg total) by mouth daily. 90 tablet 3   atorvastatin (LIPITOR) 20 MG tablet TAKE ONE TABLET BY MOUTH ONCE DAILY 30 tablet 3   AURYXIA 1 GM 210 MG(Fe) tablet Take 420-840 mg by mouth with breakfast, with lunch, and with evening meal. Taking  2 tablets daily (460m) with each snack     Biotin 10000 MCG TABS Take 10,000 mcg by mouth daily.      ceFAZolin (ANCEF) IVPB Inject 2 g  into the vein every Monday, Wednesday, and Friday with hemodialysis. Indication:  bacteremia First Dose: No Last Day of Therapy:  08/29/21 Labs - Once weekly:  CBC/D and BMP, Labs - Every other week:  ESR and CRP Method of administration: IV Push Method of administration may be changed at the discretion of home infusion pharmacist based upon assessment of the patient and/or caregiver's ability to self-administer the medication ordered. 17 Units 0   docusate sodium (COLACE) 100 MG capsule Take 1 capsule (100 mg total) by mouth 2 (two) times daily as needed for mild constipation. 10 capsule 0   lidocaine (LIDODERM) 5 % Place 1 patch onto the skin daily as needed. Remove & Discard patch within 12 hours or as directed by MD 30 patch 0   meclizine (ANTIVERT) 12.5 MG tablet Take 1 tablet (12.5 mg total) by mouth 3 (three) times daily as needed for dizziness. 30 tablet 0   melatonin 3 MG TABS tablet Take 1 tablet (3 mg total) by mouth at bedtime as needed. 30 tablet 0   multivitamin (RENA-VIT) TABS tablet Take 1 tablet by mouth at bedtime.     mupirocin ointment (BACTROBAN) 2 % Apply 1 application topically daily as needed (skin bumps). 22 g 2   Nutritional Supplements (,FEEDING SUPPLEMENT, PROSOURCE PLUS) liquid Take 30 mLs by mouth 2 (two) times daily between meals. 60 mL 0   omeprazole (PRILOSEC) 20 MG capsule Take 1 capsule (20 mg total) by mouth daily. 30 capsule 1   polyethylene glycol (MIRALAX / GLYCOLAX) 17 g packet Take 17 g by mouth daily as needed for moderate constipation. 14 each 0   vitamin E 200 UNIT capsule Take 200 Units by mouth daily.     No facility-administered medications prior to visit.     Social History   Socioeconomic History   Marital status: Single    Spouse name: Not on file   Number of children: 4   Years of education: 12th gd   Highest education level: Not on file  Occupational History   Occupation: N/A  Tobacco Use   Smoking status: Every Day    Packs/day:  0.50    Years: 50.00    Pack years: 25.00    Types: Cigarettes    Last attempt to quit: 02/03/2021    Years since quitting: 0.5   Smokeless tobacco: Former  VScientific laboratory technicianUse: Never used  Substance and Sexual Activity   Alcohol use: No    Alcohol/week: 0.0 standard drinks    Comment: she used to drink alcohol, quit in 2010; h/o heavy use    Drug use: Yes    Types: Oxycodone   Sexual activity: Not Currently  Other Topics Concern  Not on file  Social History Narrative   2 cups of coffee a day    Social Determinants of Health   Financial Resource Strain: Low Risk    Difficulty of Paying Living Expenses: Not hard at all  Food Insecurity: Not on file  Transportation Needs: Not on file  Physical Activity: Not on file  Stress: Not on file  Social Connections: Not on file  Intimate Partner Violence: Not on file      Review of Systems    All other ros negative  Objective:    There were no vitals taken for this visit. Nursing note and vital signs reviewed.  Physical Exam     General/constitutional: no distress, pleasant HEENT: Normocephalic, PER, Conj Clear, EOMI, Oropharynx clear Neck supple CV: rrr no mrg Lungs: clear to auscultation, normal respiratory effort Abd: Soft, Nontender Ext: no edema Skin: No Rash Neuro: nonfocal MSK: no peripheral joint swelling/tenderness/warmth; back spines nontender   Central line presence: left chest HD catheter site no purulence/redness/tenderness   Labs: Lab Results  Component Value Date   WBC 19.9 (H) 07/22/2021   HGB 10.2 (L) 07/22/2021   HCT 32.5 (L) 07/22/2021   MCV 90.3 07/22/2021   PLT 184 73/22/0254   Last metabolic panel Lab Results  Component Value Date   GLUCOSE 78 07/22/2021   NA 134 (L) 07/22/2021   K 4.0 07/22/2021   CL 97 (L) 07/22/2021   CO2 22 07/22/2021   BUN 18 07/22/2021   CREATININE 6.33 (H) 07/22/2021   GFRNONAA 7 (L) 07/22/2021   GFRAA 7 (L) 05/18/2020   CALCIUM 7.8 (L) 07/22/2021    PHOS 5.9 (H) 07/18/2021   PROT 7.8 07/16/2021   ALBUMIN 2.0 (L) 07/18/2021   LABGLOB 3.6 05/29/2021   AGRATIO 0.8 05/29/2021   BILITOT 1.2 07/16/2021   ALKPHOS 93 07/16/2021   AST 39 07/16/2021   ALT 21 07/16/2021   ANIONGAP 15 07/22/2021   Dialysis center labs: 8/02 hb 11 7/28 cbc 14/10/305; no lft  Micro:  Serology:  Imaging: 7/19 tte No vegetation   7/20 mri lspine No specific findings to suggest discitis/osteomyelitis at this time. No evidence of epidural abscess on this non-contrast study.   Schmorl nodes within the T12 inferior endplate, L1 inferior endplate, L3 superior endplate and L3 inferior endplate. There is mild marrow edema surrounding the Schmorl node within the T12 inferior endplate, and this Schmorl node may be acute/subacute. Minimal edema elsewhere, as described and degenerative in appearance.   Lumbar spondylosis, as outlined and with findings most notably as follows.   At L4-L5, there is a broad-based central disc protrusion at site of posterior annular fissure. This contributes to multifactorial bilateral subarticular stenosis (mild/moderate right, mild left), contacting and crowding the descending right L5 nerve root. Mild central canal narrowing. Bilateral neural foraminal narrowing (mild/moderate right, mild left). Trace right facet joint effusion.   At L5-S1, a broad-based central disc protrusion at site of posterior annular fissure contributes to multifactorial bilateral subarticular stenosis (moderate right, mild left). The disc protrusion contacts and crowds the descending right S1 nerve root. The disc protrusion may also contact the descending left S1 nerve root. Mild relative narrowing of the central canal.   No more than mild spinal canal or neural foraminal narrowing at the remaining levels.   Trace right facet joint effusion also present at L3-L4.    Assessment & Plan:   Problem List Items Addressed This Visit        Other  MSSA bacteremia - Primary    Lines: Chronic right chest tunneled hd catheter removed 7/21 Nonfunctioning LUE native AVF (no recent access for the past year)   Abx: 7/19-c cefazolin   ASSESSMENT: #mssa bacteremia #esrd #lumbar back pain Duration of sx 2 weeks/community onset likely hd catheter beign source, and removed 7/21. Nonfunctioning native avf clinically doesn't appear infected 7/18 bcx positive 7/19 and 7/20 bcx ngtd 7/19 tte no veg 7/20 mri of lumbar spine showed no evidence pyogenic focus -- however given bacteremia and no other obvious cause back pain that is new, will plan on 6 weeks tx duration LLE rash onset late 04/2019 doesn't appear to be staph aureus bsi related -- stable no change    Patient discharged on 6 weeks cefazolin with iHD from 7/21 until 9/02  8/10 id clinic f/u Doing well from id standpoint Discuss need for 6 weeks iv abx until 08/29/2021 Patient has djd related lumbar spine pain and I advise to f/u pcp for pain management. No red flag in terms of seeding of bacteria   -notified her dialysis clinic of 6 weeks planned until 8/31//2022 for cefazolin 2gram/2gram/3gram on m/w/f dialysis days (given post dialysis) -labs to be faxed over from dialysis clinic -- will review when available -- updated no obvious bone marrow suppression -continue cefazolin as above   Follow-up: Return in about 4 weeks (around 09/05/2021).   I have spent a total of 20 minutes of face-to-face and non-face-to-face time, excluding clinical staff time, preparing to see patient, ordering tests and/or medications, and provide counseling the patient     [name], [place] for [place] [phone]  pager   [phone] cell 08/08/2021, 3:15 PM

## 2021-08-09 ENCOUNTER — Ambulatory Visit (INDEPENDENT_AMBULATORY_CARE_PROVIDER_SITE_OTHER): Payer: Medicare Other

## 2021-08-09 ENCOUNTER — Encounter: Payer: Self-pay | Admitting: Nurse Practitioner

## 2021-08-09 ENCOUNTER — Other Ambulatory Visit: Payer: Self-pay

## 2021-08-09 ENCOUNTER — Ambulatory Visit (INDEPENDENT_AMBULATORY_CARE_PROVIDER_SITE_OTHER): Payer: Medicare Other | Admitting: Nurse Practitioner

## 2021-08-09 VITALS — BP 128/82 | HR 106 | Temp 98.1°F | Ht 61.0 in | Wt 199.8 lb

## 2021-08-09 VITALS — BP 128/82 | HR 106 | Temp 98.1°F | Ht 61.0 in | Wt 199.7 lb

## 2021-08-09 DIAGNOSIS — I700 Atherosclerosis of aorta: Secondary | ICD-10-CM

## 2021-08-09 DIAGNOSIS — A419 Sepsis, unspecified organism: Secondary | ICD-10-CM

## 2021-08-09 DIAGNOSIS — N186 End stage renal disease: Secondary | ICD-10-CM

## 2021-08-09 DIAGNOSIS — Z6837 Body mass index (BMI) 37.0-37.9, adult: Secondary | ICD-10-CM

## 2021-08-09 DIAGNOSIS — E6609 Other obesity due to excess calories: Secondary | ICD-10-CM | POA: Diagnosis not present

## 2021-08-09 DIAGNOSIS — Z0000 Encounter for general adult medical examination without abnormal findings: Secondary | ICD-10-CM | POA: Diagnosis not present

## 2021-08-09 DIAGNOSIS — M545 Low back pain, unspecified: Secondary | ICD-10-CM

## 2021-08-09 DIAGNOSIS — I6523 Occlusion and stenosis of bilateral carotid arteries: Secondary | ICD-10-CM

## 2021-08-09 DIAGNOSIS — I132 Hypertensive heart and chronic kidney disease with heart failure and with stage 5 chronic kidney disease, or end stage renal disease: Secondary | ICD-10-CM | POA: Diagnosis not present

## 2021-08-09 MED ORDER — ATORVASTATIN CALCIUM 10 MG PO TABS: 10.0000 mg | ORAL_TABLET | Freq: Every day | ORAL | 0 refills | Status: DC

## 2021-08-09 MED ORDER — LIDOCAINE 5 % EX PTCH: 1.0000 | MEDICATED_PATCH | Freq: Every day | CUTANEOUS | 2 refills | Status: DC | PRN

## 2021-08-09 MED ORDER — OXYCODONE-ACETAMINOPHEN 5-325 MG PO TABS: 1.0000 | ORAL_TABLET | ORAL | 0 refills | Status: DC | PRN

## 2021-08-09 NOTE — Progress Notes (Signed)
I,Yamilka Roman [employer] as a Education administrator for [employer], FNP.,have documented all relevant documentation on the behalf of [name], FNP,as directed by  [name], FNP while in the presence of [name], [place].  This visit occurred during the SARS-CoV-2 public health emergency.  Safety protocols were in place, including screening questions prior to the visit, additional usage of staff PPE, and extensive cleaning of exam room while observing appropriate contact time as indicated for disinfecting solutions.  Subjective:     Patient ID: Whitney Walker , female    DOB: 1956-07-07 , 65 y.o.   MRN: [id number]   Chief Complaint  Patient presents with   hospital f/u   Hypertension    HPI  Patient presents today for a hospital follow up and bp f/u. She was admitted on 7/18 - 7/24 with a diagnosis of MSSA bacteremia, source thought to be related to her hemodialysis catheter which was pulled on July 19, 2021.  She  had a gradual onset of constant aching in her low back nausea vomiting and diarrhea with fever 100 and hypertensive originally admitted to [facility] and given some pressors according to the hospital records.  She was weaned off pressors on July 19 she was also on vancomycin and cefepime. Her dialysis catheter was replaced on July 21, 2021 and had dialysis the same day. She was taken off all her blood pressure medications during hospitalization. She reports she is no longer having nose bleeds since being off her blood pressure medication. She is having pain to her back area but was unable to get up and down. She is having pain when stepping off the curb. She had been taking tylenol multiple times a day thought to be Rheumatoid arthritis.   Her plan of treatment is for 6 weeks from 7/21-8/31/22 with dialysis for bacteremia. Her WBC's were 13.6  Next appt with Infectious disease is Sept 6th.   She has OT, PT, SN at home She states "walking she is having pain, uses rollator walker at the  house".   She had taken Fentanyl - which caused nausea and headache, then had oxycodone.   Wt Readings from Last 3 Encounters: 08/09/21 : 199 lb 11.8 oz (90.6 kg) 08/09/21 : 199 lb 12.8 oz (90.6 kg) 08/08/21 : 199 lb (90.3 kg)  She is vaping with 5% nicotine. She does continue to smoke a little of cigarettes  She would like the transparent dressings for her vas cath  Hypertension This is a chronic problem. The current episode started more than 1 year ago. The problem is controlled. Past treatments include calcium channel blockers. Compliance problems include exercise.  Identifiable causes of hypertension include chronic renal disease. There is no history of sleep apnea.    Past Medical History:  Diagnosis Date   Anemia    Anxiety    CHF (congestive heart failure) ([place])    Colon cancer ([place])    treatment surgery   Complication of anesthesia    after first C- [place] "couldnt walk after", patient denies having a spinal   COPD (chronic obstructive pulmonary disease) ([facility])    Coronary artery disease    Depression    DVT (deep venous thrombosis) ([facility])    ESRD (end stage renal disease) ([place])    Hemo: MWF   History of blood transfusion 04/2018   Hypertension    07/07/18- no longer takes BP medications   Meningitis    Pain in limb 07/30/2013   PE (pulmonary embolism)    Peripheral  vascular disease ([place])    Restless legs    Shortness of breath    with exertion   Sleep apnea    SOB (shortness of breath) 03/03/2019   Vertigo      Family History  Problem Relation Age of Onset   Cancer Mother 15       breast and bone   Cancer Father 24       prostate   Hypertension Sister    Bleeding Disorder Sister    Cancer Cousin 78       breast cancer    Hypertension Daughter    Breast cancer Neg Hx      Current Outpatient Medications:    oxyCODONE-acetaminophen (PERCOCET) 5-325 MG tablet, Take 1 tablet by mouth every 4 (four) hours as needed for severe pain., Disp: 30 tablet, Rfl: 0    acetaminophen (TYLENOL) 500 MG tablet, Take 1,000 mg by mouth 2 (two) times daily as needed for moderate pain or headache., Disp: , Rfl:    albuterol (VENTOLIN HFA) 108 (90 Base) MCG/ACT inhaler, Inhale 2 puffs into the lungs every 6 (six) hours as needed for wheezing or shortness of breath., Disp: 18 g, Rfl: 0   apixaban (ELIQUIS) 2.5 MG TABS tablet, Take 1 tablet (2.5 mg total) by mouth 2 (two) times daily., Disp: 60 tablet, Rfl: 0   Ascorbic Acid (VITAMIN C) 100 MG tablet, Take 100 mg by mouth daily., Disp: , Rfl:    aspirin EC 81 MG EC tablet, Take 1 tablet (81 mg total) by mouth daily. Swallow whole., Disp: 30 tablet, Rfl: 11   atorvastatin (LIPITOR) 10 MG tablet, Take 1 tablet (10 mg total) by mouth daily., Disp: 30 tablet, Rfl: 0   atorvastatin (LIPITOR) 20 MG tablet, Take 1 tablet (20 mg total) by mouth daily., Disp: 90 tablet, Rfl: 1   AURYXIA 1 GM 210 MG(Fe) tablet, Take 420-840 mg by mouth with breakfast, with lunch, and with evening meal. Taking  2 tablets daily (462m) with each snack, Disp: , Rfl:    Biotin 10000 MCG TABS, Take 10,000 mcg by mouth daily. , Disp: , Rfl:    ceFAZolin (ANCEF) IVPB, Inject 2 g into the vein every Monday, Wednesday, and Friday with hemodialysis. Indication:  bacteremia First Dose: No Last Day of Therapy:  08/29/21 Labs - Once weekly:  CBC/D and BMP, Labs - Every other week:  ESR and CRP Method of administration: IV Push Method of administration may be changed at the discretion of home infusion pharmacist based upon assessment of the patient and/or caregiver's ability to self-administer the medication ordered., Disp: 17 Units, Rfl: 0   docusate sodium (COLACE) 100 MG capsule, Take 1 capsule (100 mg total) by mouth 2 (two) times daily as needed for mild constipation. (Patient not taking: Reported on 08/08/2021), Disp: 10 capsule, Rfl: 0   lidocaine (LIDODERM) 5 %, Place 1 patch onto the skin daily as needed. Remove & Discard patch within 12 hours or as directed by  MD, Disp: 30 patch, Rfl: 2   meclizine (ANTIVERT) 12.5 MG tablet, Take 1 tablet (12.5 mg total) by mouth 3 (three) times daily as needed for dizziness., Disp: 30 tablet, Rfl: 0   melatonin 3 MG TABS tablet, Take 1 tablet (3 mg total) by mouth at bedtime as needed., Disp: 30 tablet, Rfl: 2   multivitamin (RENA-VIT) TABS tablet, Take 1 tablet by mouth at bedtime., Disp: , Rfl:    mupirocin ointment (BACTROBAN) 2 %, Apply 1 application topically daily  as needed (skin bumps)., Disp: 22 g, Rfl: 2   Nutritional Supplements (,FEEDING SUPPLEMENT, PROSOURCE PLUS) liquid, Take 30 mLs by mouth 2 (two) times daily between meals., Disp: 60 mL, Rfl: 0   omeprazole (PRILOSEC) 20 MG capsule, Take 1 capsule (20 mg total) by mouth daily., Disp: 30 capsule, Rfl: 1   polyethylene glycol (MIRALAX / GLYCOLAX) 17 g packet, Take 17 g by mouth daily as needed for moderate constipation. (Patient not taking: Reported on 08/08/2021), Disp: 14 each, Rfl: 0   vitamin E 200 UNIT capsule, Take 200 Units by mouth daily., Disp: , Rfl:    Allergies  Allergen Reactions   Carnosine     Other reaction(s): Unknown   Gadolinium Derivatives Hives and Other (See Comments)    HIVES, Desc: HIVES W/ "DYE" USED FOR 1ST CT SCAN BUT NOT 2ND, NO PREMEDS USED, PT UNCERTAIN OF CIRCUMSTANCES,,?POSSIBLE MRI CONTRAST ALLERGY, ALL STUDIES DONE "SOMEWHERE" IN [place]//A.C., Onset Date: [id number]   Iohexol Other (See Comments)     Code: HIVES, Desc: HIVES W/ "DYE" USED FOR 1ST CT SCAN BUT NOT 2ND, NO PREMEDS USED, PT UNCERTAIN OF CIRCUMSTANCES,,?POSSIBLE MRI CONTRAST ALLERGY, ALL STUDIES DONE "SOMEWHERE" IN [place]//A.C., Onset Date: [id number]    Iodine Hives   Naltrexone     Other reaction(s): Unknown     Review of Systems  Constitutional: Negative.   Respiratory: Negative.    Cardiovascular: Negative.   Gastrointestinal:        Vas cath in place  Musculoskeletal:  Positive for back pain (having difficulty with standing for long  periods and performing her chores at home).  Neurological: Negative.   Psychiatric/Behavioral: Negative.      Today's Vitals   08/09/21 1119  BP: 128/82  Pulse: (!) 106  Temp: 98.1 F (36.7 C)  Weight: 199 lb 11.8 oz (90.6 kg)  Height: 5' 1" (1.549 m)  PainSc: 0-No pain   Body mass index is 37.74 kg/m.   Objective:  Physical Exam Constitutional:      General: She is not in acute distress.    Appearance: Normal appearance. She is obese.  Cardiovascular:     Pulses: Normal pulses.     Heart sounds: Normal heart sounds. No murmur heard. Pulmonary:     Effort: Pulmonary effort is normal. No respiratory distress.     Breath sounds: Normal breath sounds. No wheezing.  Skin:    General: Skin is warm and dry.     Capillary Refill: Capillary refill takes less than 2 seconds.     Comments: Vas cath present  Neurological:     General: No focal deficit present.     Mental Status: She is alert and oriented to person, place, and time.     Cranial Nerves: No cranial nerve deficit.  Psychiatric:        Mood and Affect: Mood normal.        Behavior: Behavior normal.        Thought Content: Thought content normal.        Judgment: Judgment normal.        Assessment And Plan:     1. Sepsis without acute organ dysfunction, due to unspecified organism [facility]) TCM Performed. A member of the clinical team spoke with the patient upon dischare. Discharge summary was reviewed in full detail during the visit. Meds reconciled and compared to discharge meds. Medication list is updated and reviewed with the patient.  Greater than 50% face to face time was spent in counseling an coordination  of care.  All questions were answered to the satisfaction of the patient.   She is currently receiving IV antibiotics until 8/31 with her dialysis  2. Acute midline low back pain without sciatica Will try to get lidocaine patches, unsure if insurance will cover She is also given limited supply of  oxycodone PDMP reviewed during this encounter. She is having more difficulty with performing her ADLs due to the pain.  - lidocaine (LIDODERM) 5 %; Place 1 patch onto the skin daily as needed. Remove & Discard patch within 12 hours or as directed by MD  Dispense: 30 patch; Refill: 2 - oxyCODONE-acetaminophen (PERCOCET) 5-325 MG tablet; Take 1 tablet by mouth every 4 (four) hours as needed for severe pain.  Dispense: 30 tablet; Refill: 0  3. Malignant hypertension with heart failure and end-stage renal dis ([place]) No current blood pressure medications, blood pressure is controlled  4. Class 2 obesity due to excess calories with body mass index (BMI) of 37.0 to 37.9 in adult, unspecified whether serious comorbidity present   Chronic Discussed healthy diet and regular exercise options  Encouraged to exercise at least 150 minutes per week with 2 days of strength training as tolerated  Patient was given opportunity to ask questions. Patient verbalized understanding of the plan and was able to repeat key elements of the plan. All questions were answered to their satisfaction.  [name], FNP   I, [name], FNP, have reviewed all documentation for this visit. The documentation on 08/09/21 for the exam, diagnosis, procedures, and orders are all accurate and complete.   IF YOU HAVE BEEN REFERRED TO A SPECIALIST, IT MAY TAKE 1-2 WEEKS TO SCHEDULE/PROCESS THE REFERRAL. IF YOU HAVE NOT HEARD FROM US/SPECIALIST IN TWO WEEKS, PLEASE GIVE [place] A CALL AT [phone].   THE PATIENT IS ENCOURAGED TO PRACTICE SOCIAL DISTANCING DUE TO THE COVID-19 PANDEMIC.

## 2021-08-09 NOTE — Patient Instructions (Signed)
Whitney Walker , Thank you for taking time to come for your Medicare Wellness Visit. I appreciate your ongoing commitment to your health goals. Please review the following plan we discussed and let me know if I can assist you in the future.   Screening recommendations/referrals: Colonoscopy: completed 07/24/2017 Mammogram: completed 01/25/2020 Bone Density: decline Recommended yearly ophthalmology/optometry visit for glaucoma screening and checkup Recommended yearly dental visit for hygiene and checkup  Vaccinations: Influenza vaccine: due Pneumococcal vaccine: completed 04/20/2021 Tdap vaccine: decline Shingles vaccine: decline   Covid-19: 12/22/2020, 04/07/2020, 03/10/2020  Advanced directives: Advance directive discussed with you today. Even though you declined this today please call our office should you change your mind and we can give you the proper paperwork for you to fill out.  Conditions/risks identified: none  Next appointment: Follow up in one year for your annual wellness visit    Preventive Care 65 Years and Older, Female Preventive care refers to lifestyle choices and visits with your health care provider that can promote health and wellness. What does preventive care include? A yearly physical exam. This is also called an annual well check. Dental exams once or twice a year. Routine eye exams. Ask your health care provider how often you should have your eyes checked. Personal lifestyle choices, including: Daily care of your teeth and gums. Regular physical activity. Eating a healthy diet. Avoiding tobacco and drug use. Limiting alcohol use. Practicing safe sex. Taking low-dose aspirin every day. Taking vitamin and mineral supplements as recommended by your health care provider. What happens during an annual well check? The services and screenings done by your health care provider during your annual well check will depend on your age, overall health, lifestyle risk  factors, and family history of disease. Counseling  Your health care provider may ask you questions about your: Alcohol use. Tobacco use. Drug use. Emotional well-being. Home and relationship well-being. Sexual activity. Eating habits. History of falls. Memory and ability to understand (cognition). Work and work Statistician. Reproductive health. Screening  You may have the following tests or measurements: Height, weight, and BMI. Blood pressure. Lipid and cholesterol levels. These may be checked every 5 years, or more frequently if you are over 15 years old. Skin check. Lung cancer screening. You may have this screening every year starting at age 56 if you have a 30-pack-year history of smoking and currently smoke or have quit within the past 15 years. Fecal occult blood test (FOBT) of the stool. You may have this test every year starting at age 41. Flexible sigmoidoscopy or colonoscopy. You may have a sigmoidoscopy every 5 years or a colonoscopy every 10 years starting at age 74. Hepatitis C blood test. Hepatitis B blood test. Sexually transmitted disease (STD) testing. Diabetes screening. This is done by checking your blood sugar (glucose) after you have not eaten for a while (fasting). You may have this done every 1-3 years. Bone density scan. This is done to screen for osteoporosis. You may have this done starting at age 87. Mammogram. This may be done every 1-2 years. Talk to your health care provider about how often you should have regular mammograms. Talk with your health care provider about your test results, treatment options, and if necessary, the need for more tests. Vaccines  Your health care provider may recommend certain vaccines, such as: Influenza vaccine. This is recommended every year. Tetanus, diphtheria, and acellular pertussis (Tdap, Td) vaccine. You may need a Td booster every 10 years. Zoster vaccine. You may need  this after age 54. Pneumococcal 13-valent  conjugate (PCV13) vaccine. One dose is recommended after age 48. Pneumococcal polysaccharide (PPSV23) vaccine. One dose is recommended after age 54. Talk to your health care provider about which screenings and vaccines you need and how often you need them. This information is not intended to replace advice given to you by your health care provider. Make sure you discuss any questions you have with your health care provider. Document Released: 01/12/2016 Document Revised: 09/04/2016 Document Reviewed: 10/17/2015 Elsevier Interactive Patient Education  2017 [employer] Prevention in the Home Falls can cause injuries. They can happen to people of all ages. There are many things you can do to make your home safe and to help prevent falls. What can I do on the outside of my home? Regularly fix the edges of walkways and driveways and fix any cracks. Remove anything that might make you trip as you walk through a door, such as a raised step or threshold. Trim any bushes or trees on the path to your home. Use bright outdoor lighting. Clear any walking paths of anything that might make someone trip, such as rocks or tools. Regularly check to see if handrails are loose or broken. Make sure that both sides of any steps have handrails. Any raised decks and porches should have guardrails on the edges. Have any leaves, snow, or ice cleared regularly. Use sand or salt on walking paths during winter. Clean up any spills in your garage right away. This includes oil or grease spills. What can I do in the bathroom? Use night lights. Install grab bars by the toilet and in the tub and shower. Do not use towel bars as grab bars. Use non-skid mats or decals in the tub or shower. If you need to sit down in the shower, use a plastic, non-slip stool. Keep the floor dry. Clean up any water that spills on the floor as soon as it happens. Remove soap buildup in the tub or shower regularly. Attach bath mats  securely with double-sided non-slip rug tape. Do not have throw rugs and other things on the floor that can make you trip. What can I do in the bedroom? Use night lights. Make sure that you have a light by your bed that is easy to reach. Do not use any sheets or blankets that are too big for your bed. They should not hang down onto the floor. Have a firm chair that has side arms. You can use this for support while you get dressed. Do not have throw rugs and other things on the floor that can make you trip. What can I do in the kitchen? Clean up any spills right away. Avoid walking on wet floors. Keep items that you use a lot in easy-to-reach places. If you need to reach something above you, use a strong step stool that has a grab bar. Keep electrical cords out of the way. Do not use floor polish or wax that makes floors slippery. If you must use wax, use non-skid floor wax. Do not have throw rugs and other things on the floor that can make you trip. What can I do with my stairs? Do not leave any items on the stairs. Make sure that there are handrails on both sides of the stairs and use them. Fix handrails that are broken or loose. Make sure that handrails are as long as the stairways. Check any carpeting to make sure that it is firmly attached to  the stairs. Fix any carpet that is loose or worn. Avoid having throw rugs at the top or bottom of the stairs. If you do have throw rugs, attach them to the floor with carpet tape. Make sure that you have a light switch at the top of the stairs and the bottom of the stairs. If you do not have them, ask someone to add them for you. What else can I do to help prevent falls? Wear shoes that: Do not have high heels. Have rubber bottoms. Are comfortable and fit you well. Are closed at the toe. Do not wear sandals. If you use a stepladder: Make sure that it is fully opened. Do not climb a closed stepladder. Make sure that both sides of the stepladder  are locked into place. Ask someone to hold it for you, if possible. Clearly mark and make sure that you can see: Any grab bars or handrails. First and last steps. Where the edge of each step is. Use tools that help you move around (mobility aids) if they are needed. These include: Canes. Walkers. Scooters. Crutches. Turn on the lights when you go into a dark area. Replace any light bulbs as soon as they burn out. Set up your furniture so you have a clear path. Avoid moving your furniture around. If any of your floors are uneven, fix them. If there are any pets around you, be aware of where they are. Review your medicines with your doctor. Some medicines can make you feel dizzy. This can increase your chance of falling. Ask your doctor what other things that you can do to help prevent falls. This information is not intended to replace advice given to you by your health care provider. Make sure you discuss any questions you have with your health care provider. Document Released: 10/12/2009 Document Revised: 05/23/2016 Document Reviewed: 01/20/2015 Elsevier Interactive Patient Education  2017 [employer].

## 2021-08-09 NOTE — Progress Notes (Signed)
This visit occurred during the SARS-CoV-2 public health emergency.  Safety protocols were in place, including screening questions prior to the visit, additional usage of staff PPE, and extensive cleaning of exam room while observing appropriate contact time as indicated for disinfecting solutions.  Subjective:   Whitney Walker is a 65 y.o. female who presents for Medicare Annual (Subsequent) preventive examination.  Review of Systems     Cardiac Risk Factors include: advanced age (>53mn, >>10women);hypertension;obesity (BMI >30kg/m2);sedentary lifestyle     Objective:    Today's Vitals   08/09/21 1106 08/09/21 1112  BP: 128/82   Pulse: (!) 106   Temp: 98.1 F (36.7 C)   TempSrc: Oral   SpO2: 97%   Weight: 199 lb 12.8 oz (90.6 kg)   Height: 5' 1"  (1.549 m)   PainSc:  8    Body mass index is 37.75 kg/m.  Advanced Directives 08/09/2021 07/16/2021 05/28/2021 05/01/2021 11/01/2020 10/30/2020 08/03/2020  Does Patient Have a Medical Advance Directive? No No No No Yes No Yes  Type of Advance Directive - - - - HPress photographerLiving will - HBelmont Estates[place] will  Does patient want to make changes to medical advance directive? - - - - No - Patient declined - -  Copy of [place]? - - - - No - copy requested - No - copy requested  Would patient like information on creating a medical advance directive? No - Patient declined No - Patient declined No - Patient declined No - Patient declined - - -  Pre-existing out of facility DNR order (yellow form or pink MOST form) - - - - - - -    Current Medications (verified) Outpatient Encounter Medications as of 08/09/2021  Medication Sig   acetaminophen (TYLENOL) 500 MG tablet Take 1,000 mg by mouth 2 (two) times daily as needed for moderate pain or headache.   albuterol (VENTOLIN HFA) 108 (90 Base) MCG/ACT inhaler Inhale 2 puffs into the lungs every 6 (six) hours as needed for wheezing or  shortness of breath.   apixaban (ELIQUIS) 2.5 MG TABS tablet Take 1 tablet (2.5 mg total) by mouth 2 (two) times daily.   Ascorbic Acid (VITAMIN C) 100 MG tablet Take 100 mg by mouth daily.   aspirin EC 81 MG EC tablet Take 1 tablet (81 mg total) by mouth daily. Swallow whole.   atorvastatin (LIPITOR) 10 MG tablet Take 1 tablet (10 mg total) by mouth daily.   atorvastatin (LIPITOR) 20 MG tablet TAKE ONE TABLET BY MOUTH ONCE DAILY (Patient not taking: Reported on 08/08/2021)   AURYXIA 1 GM 210 MG(Fe) tablet Take 420-840 mg by mouth with breakfast, with lunch, and with evening meal. Taking  2 tablets daily (4220m with each snack   Biotin 10000 MCG TABS Take 10,000 mcg by mouth daily.    ceFAZolin (ANCEF) IVPB Inject 2 g into the vein every Monday, Wednesday, and Friday with hemodialysis. Indication:  bacteremia First Dose: No Last Day of Therapy:  08/29/21 Labs - Once weekly:  CBC/D and BMP, Labs - Every other week:  ESR and CRP Method of administration: IV Push Method of administration may be changed at the discretion of home infusion pharmacist based upon assessment of the patient and/or caregiver's ability to self-administer the medication ordered.   docusate sodium (COLACE) 100 MG capsule Take 1 capsule (100 mg total) by mouth 2 (two) times daily as needed for mild constipation. (Patient not taking: Reported on 08/08/2021)  lidocaine (LIDODERM) 5 % Place 1 patch onto the skin daily as needed. Remove & Discard patch within 12 hours or as directed by MD (Patient not taking: Reported on 08/08/2021)   meclizine (ANTIVERT) 12.5 MG tablet Take 1 tablet (12.5 mg total) by mouth 3 (three) times daily as needed for dizziness.   melatonin 3 MG TABS tablet Take 1 tablet (3 mg total) by mouth at bedtime as needed.   multivitamin (RENA-VIT) TABS tablet Take 1 tablet by mouth at bedtime.   mupirocin ointment (BACTROBAN) 2 % Apply 1 application topically daily as needed (skin bumps).   Nutritional Supplements  (,FEEDING SUPPLEMENT, PROSOURCE PLUS) liquid Take 30 mLs by mouth 2 (two) times daily between meals.   omeprazole (PRILOSEC) 20 MG capsule Take 1 capsule (20 mg total) by mouth daily.   polyethylene glycol (MIRALAX / GLYCOLAX) 17 g packet Take 17 g by mouth daily as needed for moderate constipation. (Patient not taking: Reported on 08/08/2021)   vitamin E 200 UNIT capsule Take 200 Units by mouth daily.   No facility-administered encounter medications on file as of 08/09/2021.    Allergies (verified) Carnosine, Gadolinium derivatives, Iohexol, Iodine, and Naltrexone   History: Past Medical History:  Diagnosis Date   Anemia    Anxiety    CHF (congestive heart failure) (HCC)    Colon cancer (HCC)    treatment surgery   Complication of anesthesia    after first C- [place] "couldnt walk after", patient denies having a spinal   COPD (chronic obstructive pulmonary disease) ([place])    Coronary artery disease    Depression    DVT (deep venous thrombosis) (HCC)    ESRD (end stage renal disease) ([place])    Hemo: MWF   History of blood transfusion 04/2018   Hypertension    07/07/18- no longer takes BP medications   Meningitis    Pain in limb 07/30/2013   PE (pulmonary embolism)    Peripheral vascular disease (HCC)    Restless legs    Shortness of breath    with exertion   Sleep apnea    SOB (shortness of breath) 03/03/2019   Vertigo    Past Surgical History:  Procedure Laterality Date   ABDOMINAL AORTAGRAM N/A 07/26/2013   Procedure: ABDOMINAL Maxcine Ham;  Surgeon: [name] Firth, MD;  [facility] CATH LAB;  Service: Cardiovascular;  Laterality: N/A;   ABDOMINAL HYSTERECTOMY     AV FISTULA PLACEMENT Left 05/05/2018   Procedure: ARTERIOVENOUS (AV) FISTULA CREATION BRACHIOCEPHALIC;  Surgeon: Waynetta Sandy, MD;  Location: [place];  Service: Vascular;  Laterality: Left;   AV FISTULA PLACEMENT Left 07/16/2018   Procedure: ARTERIOVENOUS FISTULA CREATION;  Surgeon: Waynetta Sandy,  MD;  Location: [place];  Service: Vascular;  Laterality: Left;   [place] Left 09/03/2018   Procedure: BASILIC VEIN TRANSPOSITION SECOND STAGE;  Surgeon: Waynetta Sandy, MD;  Location: [place];  Service: Vascular;  Laterality: Left;   BREAST BIOPSY Left    CESAREAN SECTION     X 3 1974-1977   CHOLECYSTECTOMY     CHOLECYSTECTOMY  1980/s   COLECTOMY  2010   DIVERTICULOSIS SURGERY-2002  2012   FEMORAL-POPLITEAL BYPASS GRAFT Left 08/13/2013   Procedure: BYPASS GRAFT FEMORAL-POPLITEAL ARTERY WITH NON-REVERSED SAPHANEOUS VEIN; ULTRASOUND GUIDED;  Surgeon: Mal [name], MD;  Location: [place];  Service: Vascular;  Laterality: Left;   INSERTION OF DIALYSIS CATHETER Right 04/30/2018   Procedure: INSERTION OF Right Internal Jugular DIALYSIS CATHETER;  Surgeon: Trula Slade,  Butch Penny, MD;  Location: [facility] OR;  Service: Vascular;  Laterality: Right;   IR FLUORO GUIDE CV LINE RIGHT  07/24/2020   IR FLUORO GUIDE CV LINE RIGHT  07/21/2021   IR REMOVAL TUN CV CATH W/O FL  07/21/2020   IR REMOVAL TUN CV CATH W/O FL  07/19/2021   IR US GUIDE VASC ACCESS RIGHT  07/24/2020   IR US GUIDE VASC ACCESS RIGHT  07/21/2021   LEFT HEART CATH AND CORONARY ANGIOGRAPHY N/A 05/01/2021   Procedure: LEFT HEART CATH AND CORONARY ANGIOGRAPHY;  Surgeon: [name], MD;  Location: [place] CV LAB;  Service: Cardiovascular;  Laterality: N/A;   LOWER EXTREMITY ANGIOGRAM Left 07/26/2013   Procedure: LOWER EXTREMITY ANGIOGRAM;  Surgeon: [name] Tucker, MD;  Location: [facility] CATH LAB;  Service: Cardiovascular;  Laterality: Left;   Family History  Problem Relation Age of Onset   Cancer Mother 54       breast and bone   Cancer Father 17       prostate   Hypertension Sister    Bleeding Disorder Sister    Cancer Cousin 52       breast cancer    Hypertension Daughter    Breast cancer Neg Hx    Social History   Socioeconomic History   Marital status: Single    Spouse name: Not on file   Number of children: 4   Years of  education: 12th gd   Highest education level: Not on file  Occupational History   Occupation: N/A  Tobacco Use   Smoking status: Some Days    Packs/day: 0.50    Years: 50.00    Pack years: 25.00    Types: Cigarettes    Last attempt to quit: 02/03/2021    Years since quitting: 0.5   Smokeless tobacco: Former  Scientific laboratory technician Use: Never used  Substance and Sexual Activity   Alcohol use: No    Alcohol/week: 0.0 standard drinks    Comment: she used to drink alcohol, quit in 2010; h/o heavy use    Drug use: Not Currently    Types: Oxycodone   Sexual activity: Not Currently  Other Topics Concern   Not on file  Social History Narrative   2 cups of coffee a day    Social Determinants of Health   Financial Resource Strain: Low Risk    Difficulty of Paying Living Expenses: Not hard at all  Food Insecurity: No Food Insecurity   Worried About Charity fundraiser in the Last Year: Never true   [place] in the Last Year: Never true  Transportation Needs: No Transportation Needs   Lack of Transportation (Medical): No   Lack of Transportation (Non-Medical): No  Physical Activity: Inactive   Days of Exercise per Week: 0 days   Minutes of Exercise per Session: 0 min  Stress: No Stress Concern Present   Feeling of Stress : Not at all  Social Connections: Not on file    Tobacco Counseling Ready to quit: Not Answered Counseling given: Not Answered   Clinical Intake:  Pre-visit preparation completed: Yes  Pain : 0-10 Pain Score: 8  Pain Type: Chronic pain Pain Location: Back Pain Orientation: Lower Pain Descriptors / Indicators: Aching, Shooting Pain Onset: More than a month ago Pain Frequency: Intermittent     Nutritional Status: BMI > 30  Obese Nutritional Risks: None Diabetes: No  How often do you need to have someone help you when  you read instructions, pamphlets, or other written materials from your doctor or pharmacy?: 1 - Never  Diabetic?  no  Interpreter Needed?: No  Information entered by :: NAllen LPN   Activities of Daily Living In your present state of health, do you have any difficulty performing the following activities: 08/09/2021 05/28/2021  Hearing? N N  Vision? Y N  Comment blurry some times -  Difficulty concentrating or making decisions? Y N  Walking or climbing stairs? Y N  Dressing or bathing? N N  Doing errands, shopping? N N  Preparing Food and eating ? N -  Using the Toilet? N -  In the past six months, have you accidently leaked urine? N -  Do you have problems with loss of bowel control? N -  Managing your Medications? N -  Managing your Finances? N -  Housekeeping or managing your Housekeeping? N -  Some recent data might be hidden    Patient Care Team: [name], FNP as PCP - General (General Practice) Waynetta Sandy, MD as Consulting Physician (Vascular Surgery) [facility], [facility] as Referring Physician (Nephrology) Edrick Oh, MD as Consulting Physician (Nephrology) Truitt Merle, MD as Consulting Physician (Hematology) [facility], [place], RN as [place], [facility] (Inactive) (Pharmacist)  Indicate any recent Medical Services you may have received from other than [facility] providers in the past year (date may be approximate).     Assessment:   This is a routine wellness examination for Whitney Walker.  Hearing/Vision screen No results found.  Dietary issues and exercise activities discussed: Current Exercise Habits: The patient does not participate in regular exercise at present   Goals Addressed             This Visit's Progress    Patient Stated       08/09/2021, no goals       Depression Screen PHQ 2/9 Scores 08/09/2021 08/08/2021 08/03/2020 01/04/2020 12/07/2019 07/20/2019 06/10/2019  PHQ - 2 Score - 2 0 0 0 2 3  PHQ- 9 Score - 7 - - - 9 10  Exception  Documentation Other- indicate reason in comment box - - - - - -  Not completed completed yesterday - - - - - -    Fall Risk Fall Risk  08/09/2021 08/08/2021 08/03/2020 01/04/2020 12/07/2019  Falls in the past year? 1 1 1 1  0  Comment lose sbalance - lost balance - -  Number falls in past yr: 1 1 1  0 -  Injury with Fall? 0 0 0 0 -  Risk for fall due to : Medication side effect History of fall(s) History of fall(s);Medication side effect - -  Follow up Falls evaluation completed;Education provided;Falls prevention discussed Falls evaluation completed Falls evaluation completed;Education provided;Falls prevention discussed - -    FALL RISK PREVENTION PERTAINING TO THE HOME:  Any stairs in or around the home? Yes  If so, are there any without handrails? No  Home free of loose throw rugs in walkways, pet beds, electrical cords, etc? Yes  Adequate lighting in your home to reduce risk of falls? Yes   ASSISTIVE DEVICES UTILIZED TO PREVENT FALLS:  Life alert? No  Use of a cane, walker or w/c? Yes  Grab bars in the bathroom? No  Shower chair or bench in shower? No  Elevated toilet seat or a handicapped toilet? Yes   TIMED UP AND GO:  Was the test performed?  No .  \  Gait slow and steady without use of assistive device  Cognitive Function: MMSE - Mini Mental State Exam 03/19/2017 07/31/2016 02/05/2016 05/04/2015  Orientation to time 4 5 5 5   Orientation to Place 4 5 5 4   Registration 3 3 3 3   Attention/ Calculation 1 4 4 4   Recall 1 2 2 3   Language- name 2 objects 2 2 2 2   Language- repeat 1 1 1 1   Language- follow 3 step command 3 3 3 2   Language- read & follow direction 1 1 1 1   Write a sentence 1 1 1 1   Copy design 1 1 1 1   Total score 22 28 28 27      6CIT Screen 06/10/2019  What Year? 0 points  What month? 0 points  What time? 0 points  Count back from 20 0 points  Months in reverse 0 points  Repeat phrase 0 points  Total Score 0    Immunizations Immunization History   Administered Date(s) Administered   Fluad Quad(high Dose 65+) 10/04/2019   Hepatitis B, adult 09/28/2018, 05/19/2020   Influenza,inj,quad, With Preservative 09/23/2018   Moderna Sars-Covid-2 Vaccination 03/10/2020, 04/07/2020   Pneumococcal Conjugate-13 04/20/2021   Pneumococcal Polysaccharide-23 03/22/2019    TDAP status: Due, Education has been provided regarding the importance of this vaccine. Advised may receive this vaccine at local pharmacy or Health Dept. Aware to provide a copy of the vaccination record if obtained from local pharmacy or Health Dept. Verbalized acceptance and understanding.  Flu Vaccine status: Due, Education has been provided regarding the importance of this vaccine. Advised may receive this vaccine at local pharmacy or Health Dept. Aware to provide a copy of the vaccination record if obtained from local pharmacy or Health Dept. Verbalized acceptance and understanding.  Pneumococcal vaccine status: Up to date  Covid-19 vaccine status: Completed vaccines  Qualifies for Shingles Vaccine? Yes   Zostavax completed No   Shingrix Completed?: No.    Education has been provided regarding the importance of this vaccine. Patient has been advised to call insurance company to determine out of pocket expense if they have not yet received this vaccine. Advised may also receive vaccine at local pharmacy or Health Dept. Verbalized acceptance and understanding.  Screening Tests Health Maintenance  Topic Date Due   TETANUS/TDAP  Never done   Zoster Vaccines- Shingrix (1 of 2) Never done   COVID-19 Vaccine (3 - Moderna risk series) 05/05/2020   DEXA SCAN  Never done   INFLUENZA VACCINE  07/30/2021   MAMMOGRAM  01/24/2022   COLONOSCOPY (Pts 45-69yr Insurance coverage will need to be confirmed)  06/05/2022   PNA vac Low Risk Adult (2 of 2 - PPSV23) 03/21/2024   Hepatitis C Screening  Completed   HPV VACCINES  Aged Out    Health Maintenance  Health Maintenance Due  Topic  Date Due   TETANUS/TDAP  Never done   Zoster Vaccines- Shingrix (1 of 2) Never done   COVID-19 Vaccine (3 - Moderna risk series) 05/05/2020   DEXA SCAN  Never done   INFLUENZA VACCINE  07/30/2021    Colorectal cancer screening: Type of screening: Colonoscopy. Completed 07/24/2017. Repeat every 5 years  Mammogram status: Completed 01/24/2020. Repeat every 2 years  Bone Density status: decline  Lung Cancer Screening: (Low Dose CT Chest recommended if Age 66-80years, 30 pack-year currently smoking OR have quit w/in 15years.) does not qualify.   Lung Cancer Screening Referral: no  Additional Screening:  Hepatitis  C Screening: does qualify; Completed 05/29/2021  Vision Screening: Recommended annual ophthalmology exams for early detection of glaucoma and other disorders of the eye. Is the patient up to date with their annual eye exam?  No  Who is the provider or what is the name of the office in which the patient attends annual eye exams? none If pt is not established with a provider, would they like to be referred to a provider to establish care? No .   Dental Screening: Recommended annual dental exams for proper oral hygiene  Community Resource Referral / Chronic Care Management: CRR required this visit?  No   CCM required this visit?  No      Plan:     I have personally reviewed and noted the following in the patient's chart:   Medical and social history Use of alcohol, tobacco or illicit drugs  Current medications and supplements including opioid prescriptions.  Functional ability and status Nutritional status Physical activity Advanced directives List of other physicians Hospitalizations, surgeries, and ER visits in previous 12 months Vitals Screenings to include cognitive, depression, and falls Referrals and appointments  In addition, I have reviewed and discussed with patient certain preventive protocols, quality metrics, and best practice recommendations. A  written personalized care plan for preventive services as well as general preventive health recommendations were provided to patient.     Kellie Simmering, LPN   0/25/4270   Nurse Notes: 6 CIT not administered. Patient cognitive per direct observation

## 2021-08-09 NOTE — Patient Instructions (Signed)

## 2021-08-10 ENCOUNTER — Other Ambulatory Visit: Payer: Self-pay | Admitting: Hematology

## 2021-08-10 DIAGNOSIS — D689 Coagulation defect, unspecified: Secondary | ICD-10-CM | POA: Diagnosis not present

## 2021-08-10 DIAGNOSIS — N186 End stage renal disease: Secondary | ICD-10-CM | POA: Diagnosis not present

## 2021-08-10 DIAGNOSIS — Z992 Dependence on renal dialysis: Secondary | ICD-10-CM | POA: Diagnosis not present

## 2021-08-10 DIAGNOSIS — E875 Hyperkalemia: Secondary | ICD-10-CM | POA: Diagnosis not present

## 2021-08-10 DIAGNOSIS — D631 Anemia in chronic kidney disease: Secondary | ICD-10-CM | POA: Diagnosis not present

## 2021-08-10 DIAGNOSIS — I82412 Acute embolism and thrombosis of left femoral vein: Secondary | ICD-10-CM

## 2021-08-10 DIAGNOSIS — N2581 Secondary hyperparathyroidism of renal origin: Secondary | ICD-10-CM | POA: Diagnosis not present

## 2021-08-10 DIAGNOSIS — R7881 Bacteremia: Secondary | ICD-10-CM | POA: Diagnosis not present

## 2021-08-13 DIAGNOSIS — Z992 Dependence on renal dialysis: Secondary | ICD-10-CM | POA: Diagnosis not present

## 2021-08-13 DIAGNOSIS — N186 End stage renal disease: Secondary | ICD-10-CM | POA: Diagnosis not present

## 2021-08-13 DIAGNOSIS — E875 Hyperkalemia: Secondary | ICD-10-CM | POA: Diagnosis not present

## 2021-08-13 DIAGNOSIS — R7881 Bacteremia: Secondary | ICD-10-CM | POA: Diagnosis not present

## 2021-08-13 DIAGNOSIS — D631 Anemia in chronic kidney disease: Secondary | ICD-10-CM | POA: Diagnosis not present

## 2021-08-13 DIAGNOSIS — N2581 Secondary hyperparathyroidism of renal origin: Secondary | ICD-10-CM | POA: Diagnosis not present

## 2021-08-13 DIAGNOSIS — D689 Coagulation defect, unspecified: Secondary | ICD-10-CM | POA: Diagnosis not present

## 2021-08-14 ENCOUNTER — Telehealth: Payer: Self-pay

## 2021-08-14 ENCOUNTER — Ambulatory Visit: Payer: Medicare Other | Admitting: Nurse Practitioner

## 2021-08-15 DIAGNOSIS — D689 Coagulation defect, unspecified: Secondary | ICD-10-CM | POA: Diagnosis not present

## 2021-08-15 DIAGNOSIS — D631 Anemia in chronic kidney disease: Secondary | ICD-10-CM | POA: Diagnosis not present

## 2021-08-15 DIAGNOSIS — R7881 Bacteremia: Secondary | ICD-10-CM | POA: Diagnosis not present

## 2021-08-15 DIAGNOSIS — N186 End stage renal disease: Secondary | ICD-10-CM | POA: Diagnosis not present

## 2021-08-15 DIAGNOSIS — E875 Hyperkalemia: Secondary | ICD-10-CM | POA: Diagnosis not present

## 2021-08-15 DIAGNOSIS — N2581 Secondary hyperparathyroidism of renal origin: Secondary | ICD-10-CM | POA: Diagnosis not present

## 2021-08-15 DIAGNOSIS — Z992 Dependence on renal dialysis: Secondary | ICD-10-CM | POA: Diagnosis not present

## 2021-08-16 ENCOUNTER — Telehealth: Payer: Medicare Other

## 2021-08-17 DIAGNOSIS — N2581 Secondary hyperparathyroidism of renal origin: Secondary | ICD-10-CM | POA: Diagnosis not present

## 2021-08-17 DIAGNOSIS — D631 Anemia in chronic kidney disease: Secondary | ICD-10-CM | POA: Diagnosis not present

## 2021-08-17 DIAGNOSIS — D689 Coagulation defect, unspecified: Secondary | ICD-10-CM | POA: Diagnosis not present

## 2021-08-17 DIAGNOSIS — R7881 Bacteremia: Secondary | ICD-10-CM | POA: Diagnosis not present

## 2021-08-17 DIAGNOSIS — E875 Hyperkalemia: Secondary | ICD-10-CM | POA: Diagnosis not present

## 2021-08-17 DIAGNOSIS — N186 End stage renal disease: Secondary | ICD-10-CM | POA: Diagnosis not present

## 2021-08-17 DIAGNOSIS — Z992 Dependence on renal dialysis: Secondary | ICD-10-CM | POA: Diagnosis not present

## 2021-08-20 DIAGNOSIS — R7881 Bacteremia: Secondary | ICD-10-CM | POA: Diagnosis not present

## 2021-08-20 DIAGNOSIS — N2581 Secondary hyperparathyroidism of renal origin: Secondary | ICD-10-CM | POA: Diagnosis not present

## 2021-08-20 DIAGNOSIS — E875 Hyperkalemia: Secondary | ICD-10-CM | POA: Diagnosis not present

## 2021-08-20 DIAGNOSIS — D689 Coagulation defect, unspecified: Secondary | ICD-10-CM | POA: Diagnosis not present

## 2021-08-20 DIAGNOSIS — N186 End stage renal disease: Secondary | ICD-10-CM | POA: Diagnosis not present

## 2021-08-20 DIAGNOSIS — D631 Anemia in chronic kidney disease: Secondary | ICD-10-CM | POA: Diagnosis not present

## 2021-08-20 DIAGNOSIS — Z992 Dependence on renal dialysis: Secondary | ICD-10-CM | POA: Diagnosis not present

## 2021-08-22 DIAGNOSIS — R7881 Bacteremia: Secondary | ICD-10-CM | POA: Diagnosis not present

## 2021-08-22 DIAGNOSIS — Z992 Dependence on renal dialysis: Secondary | ICD-10-CM | POA: Diagnosis not present

## 2021-08-22 DIAGNOSIS — D631 Anemia in chronic kidney disease: Secondary | ICD-10-CM | POA: Diagnosis not present

## 2021-08-22 DIAGNOSIS — E875 Hyperkalemia: Secondary | ICD-10-CM | POA: Diagnosis not present

## 2021-08-22 DIAGNOSIS — N2581 Secondary hyperparathyroidism of renal origin: Secondary | ICD-10-CM | POA: Diagnosis not present

## 2021-08-22 DIAGNOSIS — N186 End stage renal disease: Secondary | ICD-10-CM | POA: Diagnosis not present

## 2021-08-22 DIAGNOSIS — D689 Coagulation defect, unspecified: Secondary | ICD-10-CM | POA: Diagnosis not present

## 2021-08-24 DIAGNOSIS — E875 Hyperkalemia: Secondary | ICD-10-CM | POA: Diagnosis not present

## 2021-08-24 DIAGNOSIS — D631 Anemia in chronic kidney disease: Secondary | ICD-10-CM | POA: Diagnosis not present

## 2021-08-24 DIAGNOSIS — N186 End stage renal disease: Secondary | ICD-10-CM | POA: Diagnosis not present

## 2021-08-24 DIAGNOSIS — D689 Coagulation defect, unspecified: Secondary | ICD-10-CM | POA: Diagnosis not present

## 2021-08-24 DIAGNOSIS — N2581 Secondary hyperparathyroidism of renal origin: Secondary | ICD-10-CM | POA: Diagnosis not present

## 2021-08-24 DIAGNOSIS — Z992 Dependence on renal dialysis: Secondary | ICD-10-CM | POA: Diagnosis not present

## 2021-08-24 DIAGNOSIS — R7881 Bacteremia: Secondary | ICD-10-CM | POA: Diagnosis not present

## 2021-08-27 DIAGNOSIS — N186 End stage renal disease: Secondary | ICD-10-CM | POA: Diagnosis not present

## 2021-08-27 DIAGNOSIS — D631 Anemia in chronic kidney disease: Secondary | ICD-10-CM | POA: Diagnosis not present

## 2021-08-27 DIAGNOSIS — R7881 Bacteremia: Secondary | ICD-10-CM | POA: Diagnosis not present

## 2021-08-27 DIAGNOSIS — E875 Hyperkalemia: Secondary | ICD-10-CM | POA: Diagnosis not present

## 2021-08-27 DIAGNOSIS — Z992 Dependence on renal dialysis: Secondary | ICD-10-CM | POA: Diagnosis not present

## 2021-08-27 DIAGNOSIS — D689 Coagulation defect, unspecified: Secondary | ICD-10-CM | POA: Diagnosis not present

## 2021-08-27 DIAGNOSIS — N2581 Secondary hyperparathyroidism of renal origin: Secondary | ICD-10-CM | POA: Diagnosis not present

## 2021-08-29 DIAGNOSIS — R7881 Bacteremia: Secondary | ICD-10-CM | POA: Diagnosis not present

## 2021-08-29 DIAGNOSIS — N2581 Secondary hyperparathyroidism of renal origin: Secondary | ICD-10-CM | POA: Diagnosis not present

## 2021-08-29 DIAGNOSIS — N186 End stage renal disease: Secondary | ICD-10-CM | POA: Diagnosis not present

## 2021-08-29 DIAGNOSIS — D689 Coagulation defect, unspecified: Secondary | ICD-10-CM | POA: Diagnosis not present

## 2021-08-29 DIAGNOSIS — I129 Hypertensive chronic kidney disease with stage 1 through stage 4 chronic kidney disease, or unspecified chronic kidney disease: Secondary | ICD-10-CM | POA: Diagnosis not present

## 2021-08-29 DIAGNOSIS — Z992 Dependence on renal dialysis: Secondary | ICD-10-CM | POA: Diagnosis not present

## 2021-08-29 DIAGNOSIS — E875 Hyperkalemia: Secondary | ICD-10-CM | POA: Diagnosis not present

## 2021-08-29 DIAGNOSIS — D631 Anemia in chronic kidney disease: Secondary | ICD-10-CM | POA: Diagnosis not present

## 2021-08-30 ENCOUNTER — Other Ambulatory Visit: Payer: Self-pay

## 2021-08-30 ENCOUNTER — Telehealth: Payer: Self-pay

## 2021-08-30 DIAGNOSIS — I82412 Acute embolism and thrombosis of left femoral vein: Secondary | ICD-10-CM

## 2021-08-30 DIAGNOSIS — I6523 Occlusion and stenosis of bilateral carotid arteries: Secondary | ICD-10-CM

## 2021-08-30 DIAGNOSIS — I700 Atherosclerosis of aorta: Secondary | ICD-10-CM

## 2021-08-30 MED ORDER — MELATONIN 3 MG PO TABS: 3.0000 mg | ORAL_TABLET | Freq: Every evening | ORAL | 2 refills | Status: DC | PRN

## 2021-08-30 MED ORDER — ATORVASTATIN CALCIUM 20 MG PO TABS: 20.0000 mg | ORAL_TABLET | Freq: Every day | ORAL | 1 refills | Status: DC

## 2021-08-30 MED ORDER — APIXABAN 2.5 MG PO TABS: 2.5000 mg | ORAL_TABLET | Freq: Two times a day (BID) | ORAL | 0 refills | Status: DC

## 2021-08-30 NOTE — Chronic Care Management (AMB) (Signed)
Chronic Care Management Pharmacy Assistant   Name: Kenyatte Gruber  MRN: [id number] DOB: 1956/11/27  Reason for Encounter: Medication Review/ Medication Coordination  Recent office visits:  08-09-2021 [name], [place]. START Percocet 5-325 mg every 4 hours as needed.  08-09-2021 Kellie Simmering, LPN. Medicare wellness  Recent consult visits:  08-08-2021 [name], MD (Infectious disease). Follow up in 4 weeks.  Hospital visits:  Medication Reconciliation was completed by comparing discharge summary, patient's EMR and Pharmacy list, and upon discussion with patient.   Admitted to the hospital on 07-16-2021 due to Sepsis. Discharge date was 07-22-2021. Discharged from [place]?Medications Started at [facility] Discharge:?? Aspirin 81 mg daily Ancef inject 2 g into the vein every Monday, Wednesday and Friday with hemodialysis  Colace 100 mg twice daily as needed for mild constipation Lidoderm 5% one patch onto skin daily as needed. Discard patch after 12 hours Melatonin 3 mg at bedtime as needed Miralax 17 g as needed for constipation ProSource Plus   Medication Changes at Hospital Discharge: None   Medications Discontinued at Hospital Discharge: Amlodipine 5 mg Carvedilol 12.5 mg Nystatin powder Prednisone 10 mg   Medications that remain the same after Hospital Discharge:??  -All other medications will remain the same.       Hospital visits:  Medication Reconciliation was completed by comparing discharge summary, patient's EMR and Pharmacy list, and upon discussion with patient.   Admitted to the hospital on 05-28-2021 due to Hyperkalemia . Discharge date was 05-31-2021. Discharged from [place]?Medications Started at [facility] Discharge:?? Prednisone 10 MG- 4 tablets for 6 days, 3 tablets for 6 days, 2 tablets for 6 days then 1 tablet for 2 days   Medication Changes at Hospital Discharge: None   Medications  Discontinued at Hospital Discharge: None   Admitted to the hospital on 05-01-2021 due to Acute deep vein thrombosis of femoral vein of left lower extremity. Discharge date was 05-01-2021. Discharged from [place]?Medications Started at [facility] Discharge:?? None   Medication Changes at Hospital Discharge: None   Medications Discontinued at Hospital Discharge: -Stopped Amoxicillin 500 MG. Stopped Ibuprofen 800 MG   Medications that remain the same after Hospital Discharge:?? -All other medications will remain the same  Medications: Outpatient Encounter Medications as of 08/30/2021  Medication Sig   acetaminophen (TYLENOL) 500 MG tablet Take 1,000 mg by mouth 2 (two) times daily as needed for moderate pain or headache.   albuterol (VENTOLIN HFA) 108 (90 Base) MCG/ACT inhaler Inhale 2 puffs into the lungs every 6 (six) hours as needed for wheezing or shortness of breath.   Ascorbic Acid (VITAMIN C) 100 MG tablet Take 100 mg by mouth daily.   aspirin EC 81 MG EC tablet Take 1 tablet (81 mg total) by mouth daily. Swallow whole.   atorvastatin (LIPITOR) 10 MG tablet Take 1 tablet (10 mg total) by mouth daily.   atorvastatin (LIPITOR) 20 MG tablet TAKE ONE TABLET BY MOUTH ONCE DAILY (Patient not taking: Reported on 08/08/2021)   AURYXIA 1 GM 210 MG(Fe) tablet Take 420-840 mg by mouth with breakfast, with lunch, and with evening meal. Taking  2 tablets daily (452m) with each snack   Biotin 10000 MCG TABS Take 10,000 mcg by mouth daily.    ceFAZolin (ANCEF) IVPB Inject 2 g into the vein every Monday, Wednesday, and Friday with hemodialysis. Indication:  bacteremia First Dose: No  Last Day of Therapy:  08/29/21 Labs - Once weekly:  CBC/D and BMP, Labs - Every other week:  ESR and CRP Method of administration: IV Push Method of administration may be changed at the discretion of home infusion pharmacist based upon assessment of the patient and/or caregiver's ability to  self-administer the medication ordered.   docusate sodium (COLACE) 100 MG capsule Take 1 capsule (100 mg total) by mouth 2 (two) times daily as needed for mild constipation. (Patient not taking: Reported on 08/08/2021)   ELIQUIS 2.5 MG TABS tablet TAKE ONE TABLET BY MOUTH TWICE DAILY   lidocaine (LIDODERM) 5 % Place 1 patch onto the skin daily as needed. Remove & Discard patch within 12 hours or as directed by MD   meclizine (ANTIVERT) 12.5 MG tablet Take 1 tablet (12.5 mg total) by mouth 3 (three) times daily as needed for dizziness.   melatonin 3 MG TABS tablet Take 1 tablet (3 mg total) by mouth at bedtime as needed.   multivitamin (RENA-VIT) TABS tablet Take 1 tablet by mouth at bedtime.   mupirocin ointment (BACTROBAN) 2 % Apply 1 application topically daily as needed (skin bumps).   Nutritional Supplements (,FEEDING SUPPLEMENT, PROSOURCE PLUS) liquid Take 30 mLs by mouth 2 (two) times daily between meals.   omeprazole (PRILOSEC) 20 MG capsule Take 1 capsule (20 mg total) by mouth daily.   oxyCODONE-acetaminophen (PERCOCET) 5-325 MG tablet Take 1 tablet by mouth every 4 (four) hours as needed for severe pain.   polyethylene glycol (MIRALAX / GLYCOLAX) 17 g packet Take 17 g by mouth daily as needed for moderate constipation. (Patient not taking: Reported on 08/08/2021)   vitamin E 200 UNIT capsule Take 200 Units by mouth daily.   No facility-administered encounter medications on file as of 08/30/2021.   Reviewed chart for medication changes ahead of medication coordination call.  No OVs, Consults, or hospital visits since last care coordination call/Pharmacist visit. (If appropriate, list visit date, provider name)  No medication changes indicated OR if recent visit, treatment plan here.  BP Readings from Last 3 Encounters:  08/09/21 128/82  08/09/21 128/82  08/08/21 98/65    Lab Results  Component Value Date   HGBA1C 6.2 (H) 07/16/2021     Patient obtains medications through [facility]   30 Days   Last adherence delivery included:  Atorvastatin 10 mg- 1 tablet daily Eliquis 2.5 mg twice daily Omeprazole 20 mg daily  Patient declined (meds) last month: Amlodipine- Discontinued Coreg- Discontinued  Patient is due for next adherence delivery on: 09-10-2021  Called patient and reviewed medications and coordinated delivery.  This delivery to include: Atorvastatin 10 mg- 1 tablet daily Eliquis 2.5 mg twice daily Omeprazole 20 mg daily  No short/ acute fill needed  Patient declined the following medications: Aspirin- patient states she gets that OTC Melatonin- Patient states she doesn't take  Patient needs refills for: Eliquis Atorvastatin Melatonin  Confirmed delivery date of  09-10-2021 advised patient that pharmacy will contact them the morning of delivery.  Care Gaps: Shingrix overdue Medicare wellness 08-09-2021 Covid booster overdue  Star Rating Drugs: Atorvastatin 20 mg- Last filled 07-06-2021 30 [facility] [place] Clinical Pharmacist Assistant [phone]

## 2021-08-31 DIAGNOSIS — Z992 Dependence on renal dialysis: Secondary | ICD-10-CM | POA: Diagnosis not present

## 2021-08-31 DIAGNOSIS — N186 End stage renal disease: Secondary | ICD-10-CM | POA: Diagnosis not present

## 2021-08-31 DIAGNOSIS — D689 Coagulation defect, unspecified: Secondary | ICD-10-CM | POA: Diagnosis not present

## 2021-08-31 DIAGNOSIS — D631 Anemia in chronic kidney disease: Secondary | ICD-10-CM | POA: Diagnosis not present

## 2021-08-31 DIAGNOSIS — T82594A Other mechanical complication of infusion catheter, initial encounter: Secondary | ICD-10-CM | POA: Diagnosis not present

## 2021-08-31 DIAGNOSIS — N2581 Secondary hyperparathyroidism of renal origin: Secondary | ICD-10-CM | POA: Diagnosis not present

## 2021-08-31 DIAGNOSIS — E875 Hyperkalemia: Secondary | ICD-10-CM | POA: Diagnosis not present

## 2021-09-03 DIAGNOSIS — N2581 Secondary hyperparathyroidism of renal origin: Secondary | ICD-10-CM | POA: Diagnosis not present

## 2021-09-03 DIAGNOSIS — E875 Hyperkalemia: Secondary | ICD-10-CM | POA: Diagnosis not present

## 2021-09-03 DIAGNOSIS — D689 Coagulation defect, unspecified: Secondary | ICD-10-CM | POA: Diagnosis not present

## 2021-09-03 DIAGNOSIS — N186 End stage renal disease: Secondary | ICD-10-CM | POA: Diagnosis not present

## 2021-09-03 DIAGNOSIS — Z992 Dependence on renal dialysis: Secondary | ICD-10-CM | POA: Diagnosis not present

## 2021-09-03 DIAGNOSIS — T82594A Other mechanical complication of infusion catheter, initial encounter: Secondary | ICD-10-CM | POA: Diagnosis not present

## 2021-09-03 DIAGNOSIS — D631 Anemia in chronic kidney disease: Secondary | ICD-10-CM | POA: Diagnosis not present

## 2021-09-04 ENCOUNTER — Encounter: Payer: Self-pay | Admitting: Internal Medicine

## 2021-09-04 ENCOUNTER — Ambulatory Visit (INDEPENDENT_AMBULATORY_CARE_PROVIDER_SITE_OTHER): Payer: Medicare Other

## 2021-09-04 ENCOUNTER — Other Ambulatory Visit: Payer: Self-pay | Admitting: Nurse Practitioner

## 2021-09-04 ENCOUNTER — Other Ambulatory Visit: Payer: Self-pay

## 2021-09-04 ENCOUNTER — Telehealth: Payer: Medicare Other

## 2021-09-04 ENCOUNTER — Ambulatory Visit (INDEPENDENT_AMBULATORY_CARE_PROVIDER_SITE_OTHER): Payer: Medicare Other | Admitting: Internal Medicine

## 2021-09-04 VITALS — BP 114/77 | HR 105 | Wt 200.8 lb

## 2021-09-04 DIAGNOSIS — E559 Vitamin D deficiency, unspecified: Secondary | ICD-10-CM

## 2021-09-04 DIAGNOSIS — I10 Essential (primary) hypertension: Secondary | ICD-10-CM

## 2021-09-04 DIAGNOSIS — R7881 Bacteremia: Secondary | ICD-10-CM | POA: Diagnosis not present

## 2021-09-04 DIAGNOSIS — I700 Atherosclerosis of aorta: Secondary | ICD-10-CM

## 2021-09-04 DIAGNOSIS — N186 End stage renal disease: Secondary | ICD-10-CM

## 2021-09-04 DIAGNOSIS — B9561 Methicillin susceptible Staphylococcus aureus infection as the cause of diseases classified elsewhere: Secondary | ICD-10-CM

## 2021-09-04 DIAGNOSIS — M545 Low back pain, unspecified: Secondary | ICD-10-CM

## 2021-09-04 DIAGNOSIS — E875 Hyperkalemia: Secondary | ICD-10-CM

## 2021-09-04 DIAGNOSIS — I5032 Chronic diastolic (congestive) heart failure: Secondary | ICD-10-CM

## 2021-09-04 NOTE — Patient Instructions (Signed)
We had presumed you had back involvement with the bacterial blood stream poisoning. There is a low chance of treatment failure, so please monitor for fever, chill, worsening back pain with legs numbness/tingling/weakness then give our clinic a call  At that time, there will be consideration for repeat blood culture and postentially a repeat mri if clinical exam deems relevant   At this time, I agree a visit to orthopedics surgery and pain management to treat your pain which appears chronic

## 2021-09-04 NOTE — Progress Notes (Signed)
[facility] for Infectious Disease  Patient Active Problem List   Diagnosis Date Noted   Acute low back pain    MSSA bacteremia    Septic shock ([place]) 07/16/2021   Nausea and vomiting    Abdominal pain    FUO (fever of unknown origin)    Polyarthritis    Polyarthralgia    Hyperkalemia, diminished renal excretion 05/28/2021   Generalized weakness 05/28/2021   Rash and nonspecific skin eruption 05/28/2021   Atypical chest pain    Coronary artery calcification seen on CAT scan    Hyperkalemia 05/09/2020   Left leg pain 05/09/2020   Restless leg syndrome 09/19/2018   OSA (obstructive sleep apnea) 04/29/2018   ESRD (end stage renal disease) ([place]) 04/29/2018   Dyspnea on exertion 03/12/2017   Morbid (severe) obesity due to excess calories ([place]) 03/12/2017   Angina pectoris ([place]) 03/02/2017   Anemia of chronic kidney failure, stage 3 (moderate) ([place]) 12/10/2016   History of colon cancer 08/12/2016   Mass of left breast on mammogram 12/10/2015   Depression 10/18/2015   Numbness and tingling of left arm and leg 01/24/2015   PVD (peripheral vascular disease) with claudication ([place]) 03/23/2014   PAD (peripheral artery disease) ([place]) 12/14/2013   Chronic diastolic heart failure ([place]) 11/15/2013   Essential hypertension 11/15/2013   Leucocytosis 04/28/2012      Subjective:    Patient ID: Whitney Walker, female    DOB: 1956/11/19, 65 y.o.   MRN: [id number]  No chief complaint on file.   HPI:  Whitney Walker is a 65 y.o. female on iHD admitted 7/19 for sepsis found to have mssa bacteremia  Sx of illness 2 weeks malaise HD catheter removed 7/21 and replaced 7/23 Old avf (not accessed for the past year) didn't appear affected  Has lumbar back pain but negative mri. Due to no obvious reason for back pain, and 2 weeks sx of bacteremia, decision was to treat 6 weeks. Tte negative  She has been on cefazolin 2 gram, 2 gram, 3 gram with iHD  Doing well No  n/v/diarrhea/rash No f/c  She complains of lumbar back pain and paralumbar area. Not seeing pain clinic. Has lidocaine patch prescription but not covered by insurance  ---- 09/04/21 id clinic f/u Patient finished with 6 weeks iv cefazolin with iHD on 2/16 for complicated mssa bacteremia/back pain Still have moderate level of back pain. Takes percocet prn given by her pcp. Pending ortho/nsg referal She has no n/v/diarrhea   Allergies  Allergen Reactions   Carnosine     Other reaction(s): Unknown   Gadolinium Derivatives Hives and Other (See Comments)    HIVES, Desc: HIVES W/ "DYE" USED FOR 1ST CT SCAN BUT NOT 2ND, NO PREMEDS USED, PT UNCERTAIN OF CIRCUMSTANCES,,?POSSIBLE MRI CONTRAST ALLERGY, ALL STUDIES DONE "SOMEWHERE" IN [place]//A.C., Onset Date: 24469507   Iohexol Other (See Comments)     Code: HIVES, Desc: HIVES W/ "DYE" USED FOR 1ST CT SCAN BUT NOT 2ND, NO PREMEDS USED, PT UNCERTAIN OF CIRCUMSTANCES,,?POSSIBLE MRI CONTRAST ALLERGY, ALL STUDIES DONE "SOMEWHERE" IN [place]//A.C., Onset Date: 22575051    Iodine Hives   Naltrexone     Other reaction(s): Unknown      Outpatient Medications Prior to Visit  Medication Sig Dispense Refill   acetaminophen (TYLENOL) 500 MG tablet Take 1,000 mg by mouth 2 (two) times daily as needed for moderate pain or headache.     albuterol (VENTOLIN HFA) 108 (90 Base) MCG/ACT  inhaler Inhale 2 puffs into the lungs every 6 (six) hours as needed for wheezing or shortness of breath. 18 g 0   apixaban (ELIQUIS) 2.5 MG TABS tablet Take 1 tablet (2.5 mg total) by mouth 2 (two) times daily. 60 tablet 0   Ascorbic Acid (VITAMIN C) 100 MG tablet Take 100 mg by mouth daily.     aspirin EC 81 MG EC tablet Take 1 tablet (81 mg total) by mouth daily. Swallow whole. 30 tablet 11   atorvastatin (LIPITOR) 10 MG tablet Take 1 tablet (10 mg total) by mouth daily. 30 tablet 0   atorvastatin (LIPITOR) 20 MG tablet Take 1 tablet (20 mg total) by mouth daily. 90  tablet 1   AURYXIA 1 GM 210 MG(Fe) tablet Take 420-840 mg by mouth with breakfast, with lunch, and with evening meal. Taking  2 tablets daily (412m) with each snack     Biotin 10000 MCG TABS Take 10,000 mcg by mouth daily.      ceFAZolin (ANCEF) IVPB Inject 2 g into the vein every Monday, Wednesday, and Friday with hemodialysis. Indication:  bacteremia First Dose: No Last Day of Therapy:  08/29/21 Labs - Once weekly:  CBC/D and BMP, Labs - Every other week:  ESR and CRP Method of administration: IV Push Method of administration may be changed at the discretion of home infusion pharmacist based upon assessment of the patient and/or caregiver's ability to self-administer the medication ordered. 17 Units 0   docusate sodium (COLACE) 100 MG capsule Take 1 capsule (100 mg total) by mouth 2 (two) times daily as needed for mild constipation. (Patient not taking: Reported on 08/08/2021) 10 capsule 0   lidocaine (LIDODERM) 5 % Place 1 patch onto the skin daily as needed. Remove & Discard patch within 12 hours or as directed by MD 30 patch 2   meclizine (ANTIVERT) 12.5 MG tablet Take 1 tablet (12.5 mg total) by mouth 3 (three) times daily as needed for dizziness. 30 tablet 0   melatonin 3 MG TABS tablet Take 1 tablet (3 mg total) by mouth at bedtime as needed. 30 tablet 2   multivitamin (RENA-VIT) TABS tablet Take 1 tablet by mouth at bedtime.     mupirocin ointment (BACTROBAN) 2 % Apply 1 application topically daily as needed (skin bumps). 22 g 2   Nutritional Supplements (,FEEDING SUPPLEMENT, PROSOURCE PLUS) liquid Take 30 mLs by mouth 2 (two) times daily between meals. 60 mL 0   omeprazole (PRILOSEC) 20 MG capsule Take 1 capsule (20 mg total) by mouth daily. 30 capsule 1   oxyCODONE-acetaminophen (PERCOCET) 5-325 MG tablet Take 1 tablet by mouth every 4 (four) hours as needed for severe pain. 30 tablet 0   polyethylene glycol (MIRALAX / GLYCOLAX) 17 g packet Take 17 g by mouth daily as needed for moderate  constipation. (Patient not taking: Reported on 08/08/2021) 14 each 0   vitamin E 200 UNIT capsule Take 200 Units by mouth daily.     No facility-administered medications prior to visit.     Social History   Socioeconomic History   Marital status: Single    Spouse name: Not on file   Number of children: 4   Years of education: 12th gd   Highest education level: Not on file  Occupational History   Occupation: N/A  Tobacco Use   Smoking status: Some Days    Packs/day: 0.50    Years: 50.00    Pack years: 25.00    Types: Cigarettes  Last attempt to quit: 02/03/2021    Years since quitting: 0.5   Smokeless tobacco: Former  Scientific laboratory technician Use: Never used  Substance and Sexual Activity   Alcohol use: No    Alcohol/week: 0.0 standard drinks    Comment: she used to drink alcohol, quit in 2010; h/o heavy use    Drug use: Not Currently    Types: Oxycodone   Sexual activity: Not Currently  Other Topics Concern   Not on file  Social History Narrative   2 cups of coffee a day    Social Determinants of Health   Financial Resource Strain: Low Risk    Difficulty of Paying Living Expenses: Not hard at all  Food Insecurity: No Food Insecurity   Worried About Charity fundraiser in the Last Year: Never true   [place] in the Last Year: Never true  Transportation Needs: No Transportation Needs   Lack of Transportation (Medical): No   Lack of Transportation (Non-Medical): No  Physical Activity: Inactive   Days of Exercise per Week: 0 days   Minutes of Exercise per Session: 0 min  Stress: No Stress Concern Present   Feeling of Stress : Not at all  Social Connections: Not on file  Intimate Partner Violence: Not on file      Review of Systems    All other ros negative  Objective:    There were no vitals taken for this visit.   Physical Exam     General/constitutional: no distress, pleasant HEENT: Normocephalic, PER, Conj Clear, EOMI, Oropharynx clear Neck  supple CV: rrr no mrg Lungs: clear to auscultation, normal respiratory effort Abd: Soft, Nontender Ext: no edema Skin: No Rash Neuro: nonfocal MSK: lower lumbar/paralumbar area tenderness    Central line presence: left chest HD catheter site no purulence/redness/tenderness   Labs: Lab Results  Component Value Date   WBC 19.9 (H) 07/22/2021   HGB 10.2 (L) 07/22/2021   HCT 32.5 (L) 07/22/2021   MCV 90.3 07/22/2021   PLT 184 41/32/4401   Last metabolic panel Lab Results  Component Value Date   GLUCOSE 78 07/22/2021   NA 134 (L) 07/22/2021   K 4.0 07/22/2021   CL 97 (L) 07/22/2021   CO2 22 07/22/2021   BUN 18 07/22/2021   CREATININE 6.33 (H) 07/22/2021   GFRNONAA 7 (L) 07/22/2021   GFRAA 7 (L) 05/18/2020   CALCIUM 7.8 (L) 07/22/2021   PHOS 5.9 (H) 07/18/2021   PROT 7.8 07/16/2021   ALBUMIN 2.0 (L) 07/18/2021   LABGLOB 3.6 05/29/2021   AGRATIO 0.8 05/29/2021   BILITOT 1.2 07/16/2021   ALKPHOS 93 07/16/2021   AST 39 07/16/2021   ALT 21 07/16/2021   ANIONGAP 15 07/22/2021   Dialysis center labs: 8/02 hb 11 7/28 cbc 14/10/305; no lft  Micro:  Serology:  Imaging: 7/19 tte No vegetation   7/20 mri lspine No specific findings to suggest discitis/osteomyelitis at this time. No evidence of epidural abscess on this non-contrast study.   Schmorl nodes within the T12 inferior endplate, L1 inferior endplate, L3 superior endplate and L3 inferior endplate. There is mild marrow edema surrounding the Schmorl node within the T12 inferior endplate, and this Schmorl node may be acute/subacute. Minimal edema elsewhere, as described and degenerative in appearance.   Lumbar spondylosis, as outlined and with findings most notably as follows.   At L4-L5, there is a broad-based central disc protrusion at site of posterior annular fissure. This contributes to  multifactorial bilateral subarticular stenosis (mild/moderate right, mild left), contacting and crowding the  descending right L5 nerve root. Mild central canal narrowing. Bilateral neural foraminal narrowing (mild/moderate right, mild left). Trace right facet joint effusion.   At L5-S1, a broad-based central disc protrusion at site of posterior annular fissure contributes to multifactorial bilateral subarticular stenosis (moderate right, mild left). The disc protrusion contacts and crowds the descending right S1 nerve root. The disc protrusion may also contact the descending left S1 nerve root. Mild relative narrowing of the central canal.   No more than mild spinal canal or neural foraminal narrowing at the remaining levels.   Trace right facet joint effusion also present at L3-L4.    Assessment & Plan:   Problem List Items Addressed This Visit   None Visit Diagnoses     Bacteremia due to methicillin susceptible Staphylococcus aureus (MSSA)    -  Primary      Lines: Chronic right chest tunneled hd catheter removed 7/21 Nonfunctioning LUE native AVF (no recent access for the past year)   Abx: 7/19-8/31 cefazolin   ASSESSMENT: #mssa bacteremia #esrd #lumbar back pain Duration of sx 2 weeks/community onset likely hd catheter beign source, and removed 7/21. Nonfunctioning native avf clinically doesn't appear infected 7/18 bcx positive 7/19 and 7/20 bcx ngtd 7/19 tte no veg 7/20 mri of lumbar spine showed no evidence pyogenic focus -- however given bacteremia and no other obvious cause back pain that is new, will plan on 6 weeks tx duration LLE rash onset late 04/2019 doesn't appear to be staph aureus bsi related -- stable no change    Patient discharged on 6 weeks cefazolin with iHD from 7/21 until 9/02  8/10 id clinic f/u Doing well from id standpoint Discuss need for 6 weeks iv abx until 08/29/2021 Patient has djd related lumbar spine pain and I advise to f/u pcp for pain management. No red flag in terms of seeding of bacteria  9/06 id clinic f/u Mssa bacteremia should be  well treated with 6 weeks (again due to new onset back pain around time of bacteremia) Advise patient to call me if f/c, worsening pain lower back/numbness-tingling in LE She asked about repeat surveillance blood cx but I advise against it  -notified her dialysis clinic of 6 weeks planned until 8/31//2022 for cefazolin 2gram/2gram/3gram on m/w/f dialysis days (given post dialysis) -labs to be faxed over from dialysis clinic -- will review when available -- updated no obvious bone marrow suppression -continue cefazolin as above   Follow-up: Return if symptoms worsen or fail to improve.   I have spent a total of 20 minutes of face-to-face and non-face-to-face time, excluding clinical staff time, preparing to see patient, ordering tests and/or medications, and provide counseling the patient     [name], [place] for [place] [phone]  pager   [phone] cell 09/04/2021, 2:33 PM

## 2021-09-05 DIAGNOSIS — N2581 Secondary hyperparathyroidism of renal origin: Secondary | ICD-10-CM | POA: Diagnosis not present

## 2021-09-05 DIAGNOSIS — D689 Coagulation defect, unspecified: Secondary | ICD-10-CM | POA: Diagnosis not present

## 2021-09-05 DIAGNOSIS — N186 End stage renal disease: Secondary | ICD-10-CM | POA: Diagnosis not present

## 2021-09-05 DIAGNOSIS — E875 Hyperkalemia: Secondary | ICD-10-CM | POA: Diagnosis not present

## 2021-09-05 DIAGNOSIS — D631 Anemia in chronic kidney disease: Secondary | ICD-10-CM | POA: Diagnosis not present

## 2021-09-05 DIAGNOSIS — Z992 Dependence on renal dialysis: Secondary | ICD-10-CM | POA: Diagnosis not present

## 2021-09-05 DIAGNOSIS — T82594A Other mechanical complication of infusion catheter, initial encounter: Secondary | ICD-10-CM | POA: Diagnosis not present

## 2021-09-06 ENCOUNTER — Other Ambulatory Visit: Payer: Self-pay

## 2021-09-06 DIAGNOSIS — I700 Atherosclerosis of aorta: Secondary | ICD-10-CM

## 2021-09-06 DIAGNOSIS — I6523 Occlusion and stenosis of bilateral carotid arteries: Secondary | ICD-10-CM

## 2021-09-06 MED ORDER — ATORVASTATIN CALCIUM 10 MG PO TABS: 10.0000 mg | ORAL_TABLET | Freq: Every day | ORAL | 2 refills | Status: DC

## 2021-09-07 DIAGNOSIS — D631 Anemia in chronic kidney disease: Secondary | ICD-10-CM | POA: Diagnosis not present

## 2021-09-07 DIAGNOSIS — Z992 Dependence on renal dialysis: Secondary | ICD-10-CM | POA: Diagnosis not present

## 2021-09-07 DIAGNOSIS — N2581 Secondary hyperparathyroidism of renal origin: Secondary | ICD-10-CM | POA: Diagnosis not present

## 2021-09-07 DIAGNOSIS — N186 End stage renal disease: Secondary | ICD-10-CM | POA: Diagnosis not present

## 2021-09-07 DIAGNOSIS — E875 Hyperkalemia: Secondary | ICD-10-CM | POA: Diagnosis not present

## 2021-09-07 DIAGNOSIS — T82594A Other mechanical complication of infusion catheter, initial encounter: Secondary | ICD-10-CM | POA: Diagnosis not present

## 2021-09-07 DIAGNOSIS — D689 Coagulation defect, unspecified: Secondary | ICD-10-CM | POA: Diagnosis not present

## 2021-09-10 DIAGNOSIS — D631 Anemia in chronic kidney disease: Secondary | ICD-10-CM | POA: Diagnosis not present

## 2021-09-10 DIAGNOSIS — D689 Coagulation defect, unspecified: Secondary | ICD-10-CM | POA: Diagnosis not present

## 2021-09-10 DIAGNOSIS — N186 End stage renal disease: Secondary | ICD-10-CM | POA: Diagnosis not present

## 2021-09-10 DIAGNOSIS — E875 Hyperkalemia: Secondary | ICD-10-CM | POA: Diagnosis not present

## 2021-09-10 DIAGNOSIS — N2581 Secondary hyperparathyroidism of renal origin: Secondary | ICD-10-CM | POA: Diagnosis not present

## 2021-09-10 DIAGNOSIS — Z992 Dependence on renal dialysis: Secondary | ICD-10-CM | POA: Diagnosis not present

## 2021-09-10 DIAGNOSIS — T82594A Other mechanical complication of infusion catheter, initial encounter: Secondary | ICD-10-CM | POA: Diagnosis not present

## 2021-09-10 NOTE — Patient Instructions (Signed)
Goals Addressed      COMPLETED: Autoimmune - Evaluated and treated       Timeframe:  Long-Range Goal Priority:  High Start Date:  06/12/21                           Expected End Date: 06/12/22  Next follow up date: 07/17/21         Self Care Activities:  Self administers medications as prescribed Attends all scheduled provider appointments Calls pharmacy for medication refills Calls provider office for new concerns or questions Patient Goals: - follow up with Rheumatologist for evaluation and treatment of auto-immune condition                   Chronic pain treatment optimized   On track    Timeframe:  Long-Range Goal Priority:  High Start Date:  09/04/21                           Expected End Date: 09/04/22    Next Scheduled follow up: 11/19/21    Patient Goals: - Make and keep Orthopedic MD new patient appointment - continue to follow HEP as directed - continue to use DME for ambulatory support                       Improve My Heart Health-Coronary Artery Disease   On track    Timeframe:  Long-Range Goal Priority:  High Start Date: 04/06/21                            Expected End Date: 04/06/22                     Follow Up Date: 11/19/21    - be open to making changes - I can manage, know and watch for signs of a heart attack - if I have chest pain, call for help - learn about small changes that will make a big difference - learn my personal risk factors  Patient Goals: -continue to monitor BP and record at home -seek medical attention promply for elevated BP reading above acceptable range as discussed or if experiencing symptoms of shortness of breath and or chest pain and or if experiencing signs/symptoms suggestive of a Stroke then call 911   Why is this important?   Lifestyle changes are key to improving the blood flow to your heart. Think about the things you can change and set a goal to live healthy.  Remember, when the blood vessels to your heart start to get clogged  you may not have any symptoms.  Over time, they can get worse.  Don't ignore the signs, like chest pain, and get help right away.     Notes:

## 2021-09-10 NOTE — Chronic Care Management (AMB) (Signed)
Chronic Care Management   CCM RN Visit Note  09/04/2021 Name: Whitney Walker MRN: [id number] DOB: Jun 15, 1956  Subjective: Whitney Walker is a 65 y.o. year old female who is a primary care patient of [name], [place]. The care management team was consulted for assistance with disease management and care coordination needs.    Engaged with patient by telephone for follow up visit in response to provider referral for case management and/or care coordination services.   Consent to Services:  The patient was given information about Chronic Care Management services, agreed to services, and gave verbal consent prior to initiation of services.  Please see initial visit note for detailed documentation.   Patient agreed to services and verbal consent obtained.   Assessment: Review of patient past medical history, allergies, medications, health status, including review of consultants reports, laboratory and other test data, was performed as part of comprehensive evaluation and provision of chronic care management services.   SDOH (Social Determinants of Health) assessments and interventions performed:  Yes, no acute challenges   CCM Care Plan  Allergies  Allergen Reactions   Carnosine     Other reaction(s): Unknown   Gadolinium Derivatives Hives and Other (See Comments)    HIVES, Desc: HIVES W/ "DYE" USED FOR 1ST CT SCAN BUT NOT 2ND, NO PREMEDS USED, PT UNCERTAIN OF CIRCUMSTANCES,,?POSSIBLE MRI CONTRAST ALLERGY, ALL STUDIES DONE "SOMEWHERE" IN [place]//A.C., Onset Date: [id number]   Iohexol Other (See Comments)     Code: HIVES, Desc: HIVES W/ "DYE" USED FOR 1ST CT SCAN BUT NOT 2ND, NO PREMEDS USED, PT UNCERTAIN OF CIRCUMSTANCES,,?POSSIBLE MRI CONTRAST ALLERGY, ALL STUDIES DONE "SOMEWHERE" IN [place]//A.C., Onset Date: [id number]    Iodine Hives   Naltrexone     Other reaction(s): Unknown    Outpatient Encounter Medications as of 09/04/2021  Medication Sig   acetaminophen  (TYLENOL) 500 MG tablet Take 1,000 mg by mouth 2 (two) times daily as needed for moderate pain or headache.   albuterol (VENTOLIN HFA) 108 (90 Base) MCG/ACT inhaler Inhale 2 puffs into the lungs every 6 (six) hours as needed for wheezing or shortness of breath.   apixaban (ELIQUIS) 2.5 MG TABS tablet Take 1 tablet (2.5 mg total) by mouth 2 (two) times daily.   Ascorbic Acid (VITAMIN C) 100 MG tablet Take 100 mg by mouth daily.   aspirin EC 81 MG EC tablet Take 1 tablet (81 mg total) by mouth daily. Swallow whole.   AURYXIA 1 GM 210 MG(Fe) tablet Take 420-840 mg by mouth with breakfast, with lunch, and with evening meal. Taking  2 tablets daily (419m) with each snack   Biotin 10000 MCG TABS Take 10,000 mcg by mouth daily.    ceFAZolin (ANCEF) IVPB Inject 2 g into the vein every Monday, Wednesday, and Friday with hemodialysis. Indication:  bacteremia First Dose: No Last Day of Therapy:  08/29/21 Labs - Once weekly:  CBC/D and BMP, Labs - Every other week:  ESR and CRP Method of administration: IV Push Method of administration may be changed at the discretion of home infusion pharmacist based upon assessment of the patient and/or caregiver's ability to self-administer the medication ordered. (Patient not taking: Reported on 09/04/2021)   docusate sodium (COLACE) 100 MG capsule Take 1 capsule (100 mg total) by mouth 2 (two) times daily as needed for mild constipation. (Patient not taking: Reported on 08/08/2021)   lidocaine (LIDODERM) 5 % Place 1 patch onto the skin daily as needed. Remove & Discard patch within 12  hours or as directed by MD   meclizine (ANTIVERT) 12.5 MG tablet Take 1 tablet (12.5 mg total) by mouth 3 (three) times daily as needed for dizziness.   melatonin 3 MG TABS tablet Take 1 tablet (3 mg total) by mouth at bedtime as needed.   multivitamin (RENA-VIT) TABS tablet Take 1 tablet by mouth at bedtime.   mupirocin ointment (BACTROBAN) 2 % Apply 1 application topically daily as needed  (skin bumps).   Nutritional Supplements (,FEEDING SUPPLEMENT, PROSOURCE PLUS) liquid Take 30 mLs by mouth 2 (two) times daily between meals.   omeprazole (PRILOSEC) 20 MG capsule Take 1 capsule (20 mg total) by mouth daily.   oxyCODONE-acetaminophen (PERCOCET) 5-325 MG tablet Take 1 tablet by mouth every 4 (four) hours as needed for severe pain.   polyethylene glycol (MIRALAX / GLYCOLAX) 17 g packet Take 17 g by mouth daily as needed for moderate constipation. (Patient not taking: Reported on 08/08/2021)   vitamin E 200 UNIT capsule Take 200 Units by mouth daily.   [DISCONTINUED] atorvastatin (LIPITOR) 10 MG tablet Take 1 tablet (10 mg total) by mouth daily.   [DISCONTINUED] atorvastatin (LIPITOR) 20 MG tablet Take 1 tablet (20 mg total) by mouth daily.   No facility-administered encounter medications on file as of 09/04/2021.    Patient Active Problem List   Diagnosis Date Noted   Acute low back pain    MSSA bacteremia    Septic shock ([place]) 07/16/2021   Nausea and vomiting    Abdominal pain    FUO (fever of unknown origin)    Polyarthritis    Polyarthralgia    Hyperkalemia, diminished renal excretion 05/28/2021   Generalized weakness 05/28/2021   Rash and nonspecific skin eruption 05/28/2021   Atypical chest pain    Coronary artery calcification seen on CAT scan    Hyperkalemia 05/09/2020   Left leg pain 05/09/2020   Restless leg syndrome 09/19/2018   OSA (obstructive sleep apnea) 04/29/2018   ESRD (end stage renal disease) ([place]) 04/29/2018   Dyspnea on exertion 03/12/2017   Morbid (severe) obesity due to excess calories ([place]) 03/12/2017   Angina pectoris ([place]) 03/02/2017   Anemia of chronic kidney failure, stage 3 (moderate) ([place]) 12/10/2016   History of colon cancer 08/12/2016   Mass of left breast on mammogram 12/10/2015   Depression 10/18/2015   Numbness and tingling of left arm and leg 01/24/2015   PVD (peripheral vascular disease) with claudication ([place]) 03/23/2014   PAD  (peripheral artery disease) ([place]) 12/14/2013   Chronic diastolic heart failure ([place]) 11/15/2013   Essential hypertension 11/15/2013   Leucocytosis 04/28/2012    Conditions to be addressed/monitored: ESRD, HTN, CHF, Vitamin D deficiency, Hyperkalemia, Aortic Atherosclerosis , HLD, Vitamin D deficiency    Care Plan : Coronary Artery Disease (Adult)  Updates made by [name], RN since 09/04/2021 12:00 AM     Problem: Disease Progression (Coronary Artery Disease)   Priority: High     Long-Range Goal: Disease Progression Prevented or Minimized   Start Date: 04/06/2021  Expected End Date: 04/05/2022  Recent Progress: On track  Priority: High  Note:   Current Barriers:  Ineffective Self Health Maintenance  Clinical Goal(s):  Collaboration with [name], FNP regarding development and update of comprehensive plan of care as evidenced by provider attestation and co-signature Inter-disciplinary care team collaboration (see longitudinal plan of care) patient will work with care management team to address care coordination and chronic disease management needs related to Disease Management Educational Needs  Care Coordination Medication Management and Education Psychosocial Support   Interventions:  Evaluation of current treatment plan related to CAD, self-management and patient's adherence to plan as established by provider. Collaboration with [name], FNP regarding development and update of comprehensive plan of care as evidenced by provider attestation       and co-signature Inter-disciplinary care team collaboration (see longitudinal plan of care) Provided education to patient about basic disease process for CAD Review of patient status, including review of consultant's reports, relevant laboratory and other test results, and medications completed. Reviewed and discussed most recent Cardiology follow up completed on 05/04/21: Recommendations:    Whitney Walker is a 65  y.o. AAfrican-American female with history of obesity, hypertension, hyperlipidemia, tobacco use, stage IV kidney disease on dialysis, obstructive sleep apnea not on CPAP, COPD and severe peripheral arterial disease with history of left femoral-popliteal bypass surgery by Dr. Victorino Dike in 2014.  Patient is on chronic anticoagulation with Eliquis due to recurrent DVT and PE in 2014 and 2016.  Patient is followed by hematology for hypercoagulable state.  She is currently undergoing hemodialysis on a Monday/Wednesday/Friday schedule.   Patient presents for follow-up after left heart catheterization coronary angiography on 05/01/2021. Coronary angiography revealed moderate disease in RCA and circumflex as well as minimal disease in LAD, and recommended evaluation for noncardiac cause of chest pain.  Patient has had no recurrence of chest pain since last visit.  She continues to tolerate anticoagulation with Eliquis for hypercoagulable state, therefore despite known CAD do not recommend patient taking aspirin.  We will continue other guideline directed medical therapy including atorvastatin and carvedilol.  Patient is notably not on ACE inhibitor or ARB due to renal disease.   Patient underwent coronary calcium score which was >4000, therefore recommended nuclear stress test which patient was unable to complete and therefore underwent left heart catheterization.  Blood pressure is well controlled.  Notably patient's LDL on last check was 100, goal would be closer to LDL of 70.  She has had difficulty tolerating statin therapy in the past, however she appears to be tolerating atorvastatin 10 mg without issue therefore we will continue this.  We will repeat lipid profile testing at upcoming visit with PCP.  Could consider addition of Zetia in the future for further management of hyperlipidemia.  Patient would also be a good candidate for Repatha or Praluent, however she is very resistant to the idea of injectable  medications.   Patient is relatively stable from a cardiovascular standpoint.  Follow-up in 4 months, sooner if needed, for CAD and hyperlipidemia after wellness visit with PCP.  Reviewed medications with patient and discussed importance of medication adherence  Reviewed and discussed recent BP medication changes following a recent hospital event for Bacteremia including, discontinuing Amlodipine and Carvedilol Discussed patient is self monitoring her BP at home and noticing some elevated BP with her diastolic BP >619 on her off dialysis days Educated on potential rebound Hypertension, Instructed patient to notify her dialysis clinic tomorrow in order to further evaluate Placed successful outbound call to the [place] in [facility], spoke with the charge nurse, provided a patient status update concerning elevated BP readings on off dialysis days and patient's recent d/c of BP meds following IP event, Discussed the charge nurse will notify the rounding PA and have her further evaluate  Sent in basket message to PCP provider with patient status update concerning elevated BP readings and collaboration with the kidney center to have Nephrology  team further evaluate/treat Instructed patient to continue to self monitor her BP, educated on abnormal BP range and when to call the doctor or seek medical attention if needed   Discussed plans with patient for ongoing care management follow up and provided patient with direct contact information for care management team Self Care Activities:   - be open to making changes - I can manage, know and watch for signs of a heart attack - if I have chest pain, call for help - learn about small changes that will make a big difference - learn my personal risk factors Patient Goals: -continue to monitor BP and record at home -seek medical attention promply for elevated BP reading above acceptable range as discussed or if experiencing symptoms of  shortness of breath and or chest pain and or if experiencing signs/symptoms suggestive of a Stroke then call 911  Follow Up Plan: Telephone follow up appointment with care management team member scheduled for: 11/19/21     Care Plan : Vitamin D deficiency  Updates made by [name], RN since 09/04/2021 12:00 AM     Problem: Vitamin D deficiency   Priority: High     Long-Range Goal: Vitamin D deficiency improved or resolved   Start Date: 04/06/2021  Expected End Date: 04/05/2022  Recent Progress: On track  Priority: High  Note:   Current Barriers:  Ineffective Self Health Maintenance  Clinical Goal(s):  Collaboration with [name], FNP regarding development and update of comprehensive plan of care as evidenced by provider attestation and co-signature Inter-disciplinary care team collaboration (see longitudinal plan of care) patient will work with care management team to address care coordination and chronic disease management needs related to Disease Management Educational Needs Care Coordination Medication Management and Education Psychosocial Support   Interventions:  04/06/21 completed successful outbound call to patient  Evaluation of current treatment plan related to  Vitamin D deficiency , self-management and patient's adherence to plan as established by provider. Collaboration with [name], FNP regarding development and update of comprehensive plan of care as evidenced by provider attestation       and co-signature Inter-disciplinary care team collaboration (see longitudinal plan of care) Provided education to patient about basic disease process related to Vitamin D deficiency Review of patient status, including review of consultants reports, relevant laboratory and other test results, and medications completed. Educated patient on treatment and dietary recommendations  Reviewed medications with patient and discussed importance of medication adherence; Determined  patient is unable to tolerate the oral supplement due to experiencing severe nausea and abdominal pain when taking this medication Sent Pharm D referral for counseling and determination of alternative supplement for Vitamin D dosing/frequency  Sent secure message to PCP provider requesting a refill for  Discussed plans with patient for ongoing care management follow up and provided patient with direct contact information for care management team 04/16/21 placed successful outbound call to [place], [place] with [facility] for collaboration regarding renal recommendations for Vitamin D deficiency  Determined per Lucita Ferrara RD and per Nephrologist, oral supplementation is appropriate for Ms. Dorsainvil and should be considered   Sent in basket message to embedded Pharm D [name] advising of this collaboration and asked for additional recommendations for supplementation/dosing since Ms. Howser is experiencing GI SE to the last dose prescribed Self Care Activities:  Continue to adhere to MD recommendations for treatment of Vitamin D deficiency   Continue to keep all scheduled follow up appointments Take medications as  directed  Let your healthcare team know if you are unable to take your medications Call your pharmacy for refills at least 7 days prior to running out of medication Patient Goals: - to improve and or resolve Vitamin D deficiency  Follow Up Plan: Telephone follow up appointment with care management team member scheduled for: 11/19/21     Care Plan : Chronic Back Pain  Updates made by [name], RN since 09/04/2021 12:00 AM     Problem: Chronic back pain   Priority: High     Long-Range Goal: Chronic Back Pain treatment optimized   Start Date: 09/04/2021  Expected End Date: 09/04/2022  Recent Progress: On track  Priority: High  Note:   Current Barriers:  Ineffective Self Health Maintenance  Clinical Goal(s):  Collaboration with [name], FNP  regarding development and update of comprehensive plan of care as evidenced by provider attestation and co-signature Inter-disciplinary care team collaboration (see longitudinal plan of care) patient will work with care management team to address care coordination and chronic disease management needs related to Disease Management Educational Needs Care Coordination Medication Management and Education Psychosocial Support   Interventions:  09/04/21 completed successful inbound call with patient  Evaluation of current treatment plan related to  ESRD, HTN, CHF, Vitamin D deficiency, Hyperkalemia, Aortic Atherosclerosis , HLD  , self-management and patient's adherence to plan as established by provider. Collaboration with [name], FNP regarding development and update of comprehensive plan of care as evidenced by provider attestation       and co-signature Inter-disciplinary care team collaboration (see longitudinal plan of care) Determined patient was evaluated for Rheumatoid arthritis and or other auto-immune conditions and all were ruled out  Determined patient states she was diagnosed with Osteoarthritis and this condition is causing moderate to severe back pain and weakness Discussed patient has worked with PT in the past, however she does not feel it will be beneficial for her at this time due to she states she continues to follow her HEP  Reviewed and discussed most recent imaging of spinal lumbar to help determine potential etiology for persistent pain to patient's lumbar spine Educated patient on basic disease process for degenerative disc, spinal stenosis and other orthopedic spinal anomalies  Collaborated with PCP provider [name], FNP to request an Orthopedic referral for further evaluation and treatment of patients lumbar pain Reviewed medications with patient and discussed importance of medication adherence and determined patient is taking her medications as prescribed Discussed  plans with patient for ongoing care management follow up and provided patient with direct contact information for care management team Self Care Activities:  Self administers medications as prescribed Attends all scheduled provider appointments Calls pharmacy for medication refills Calls provider office for new concerns or questions Patient Goals: - Make and keep Orthopedic MD new patient appointment - continue to follow HEP as directed - continue to use DME for ambulatory support   Follow Up Plan: Telephone follow up appointment with care management team member scheduled for: 11/19/21     Plan:Telephone follow up appointment with care management team member scheduled for:  11/19/21  [name], RN, BSN, CCM Care Management Coordinator [facility] [facility]  Direct Phone: [phone]

## 2021-09-12 DIAGNOSIS — D689 Coagulation defect, unspecified: Secondary | ICD-10-CM | POA: Diagnosis not present

## 2021-09-12 DIAGNOSIS — Z992 Dependence on renal dialysis: Secondary | ICD-10-CM | POA: Diagnosis not present

## 2021-09-12 DIAGNOSIS — T82594A Other mechanical complication of infusion catheter, initial encounter: Secondary | ICD-10-CM | POA: Diagnosis not present

## 2021-09-12 DIAGNOSIS — N2581 Secondary hyperparathyroidism of renal origin: Secondary | ICD-10-CM | POA: Diagnosis not present

## 2021-09-12 DIAGNOSIS — D631 Anemia in chronic kidney disease: Secondary | ICD-10-CM | POA: Diagnosis not present

## 2021-09-12 DIAGNOSIS — E875 Hyperkalemia: Secondary | ICD-10-CM | POA: Diagnosis not present

## 2021-09-12 DIAGNOSIS — N186 End stage renal disease: Secondary | ICD-10-CM | POA: Diagnosis not present

## 2021-09-12 NOTE — Progress Notes (Signed)
Primary Physician/Referring:  [name], FNP  Patient ID: Whitney Walker, female    DOB: 04-20-1956, 65 y.o.   MRN: [id number]  Chief Complaint  Patient presents with   Coronary Artery Disease   Hypertension   Hyperlipidemia   Follow-up    4 months   HPI:    Whitney Walker  is a 65 y.o. African-American female with history of obesity, hypertension, hyperlipidemia, tobacco use, stage IV kidney disease on dialysis, obstructive sleep apnea not on CPAP, COPD and severe peripheral arterial disease with history of left femoral-popliteal bypass surgery by Dr. Victorino Dike in 2014.  Patient is on chronic anticoagulation with Eliquis due to recurrent DVT and PE in 2014 and 2016.  Patient is followed by hematology for hypercoagulable state.  She is currently undergoing hemodialysis on a Monday/Wednesday/Friday schedule.  Patient presents for 68-monthfollow-up of CAD and hyperlipidemia.  At last office visit ordered repeat lipid profile testing.  Since last office visit patient has been hospitalized 05/28/2021 - 05/31/2021 with hyperkalemia and painful rash concerning for erythema nodosum.  Patient then again admitted 07/16/2021 - 07/22/2021 with septic shock secondary to MSSA bacteremia, for which she continues to follow with infectious disease.  Following discharge patient has had several episodes of left sided chest pain which she describes as "pins and needles" at rest lasting <5 minutes. Denies associated symptoms. She has also had several episodes of palpitations specifically during dialysis lasting several seconds, and again without other associated symptoms. Currently her physical activity is limited due to MSK pain.   Denies dyspnea, syncope, near syncope, dizziness, leg swelling, orthopnea, PND.  She is still non-compliant with CPAP.   Past Medical History:  Diagnosis Date   Anemia    Anxiety    CHF (congestive heart failure) ([facility]    Colon cancer ([facility]    treatment surgery    Complication of anesthesia    after first C- [place] "couldnt walk after", patient denies having a spinal   COPD (chronic obstructive pulmonary disease) ([facility]    Coronary artery disease    Depression    DVT (deep venous thrombosis) (HCC)    ESRD (end stage renal disease) ([facility]    Hemo: MWF   History of blood transfusion 04/2018   Hypertension    07/07/18- no longer takes BP medications   Meningitis    Pain in limb 07/30/2013   PE (pulmonary embolism)    Peripheral vascular disease (HCC)    Restless legs    Shortness of breath    with exertion   Sleep apnea    SOB (shortness of breath) 03/03/2019   Vertigo    Past Surgical History:  Procedure Laterality Date   ABDOMINAL AORTAGRAM N/A 07/26/2013   Procedure: ABDOMINAL AMaxcine Ham  Surgeon: BConrad Georgetown MD;  [facility];  Service: Cardiovascular;  Laterality: N/A;   ABDOMINAL HYSTERECTOMY     AV FISTULA PLACEMENT Left 05/05/2018   Procedure: ARTERIOVENOUS (AV) FISTULA CREATION BRACHIOCEPHALIC;  Surgeon: CWaynetta Sandy MD;  [place]  Service: Vascular;  Laterality: Left;   AV FISTULA PLACEMENT Left 07/16/2018   Procedure: ARTERIOVENOUS FISTULA CREATION;  Surgeon: CWaynetta Sandy MD;  [place] [facility]  Service: Vascular;  Laterality: Left;   [place] 09[place]   Procedure: BASILIC VEIN TRANSPOSITION SECOND STAGE;  Surgeon: CWaynetta Sandy MD;  [place]  Service: Vascular;  Laterality: Left;   BREAST BIOPSY Left    CESAREAN SECTION  X 3 1974-1977   CHOLECYSTECTOMY     CHOLECYSTECTOMY  1980/s   COLECTOMY  2010   DIVERTICULOSIS SURGERY-2002  2012   FEMORAL-POPLITEAL BYPASS GRAFT Left 08/13/2013   Procedure: BYPASS GRAFT FEMORAL-POPLITEAL ARTERY WITH NON-REVERSED SAPHANEOUS VEIN; ULTRASOUND GUIDED;  Surgeon: Mal [name], MD;  Location: [place];  Service: Vascular;  Laterality: Left;   INSERTION OF DIALYSIS CATHETER Right 04/30/2018   Procedure: INSERTION OF  Right Internal Jugular DIALYSIS CATHETER;  Surgeon: [name], MD;  Location: [facility];  Service: Vascular;  Laterality: Right;   IR FLUORO GUIDE CV LINE RIGHT  07/24/2020   IR FLUORO GUIDE CV LINE RIGHT  07/21/2021   IR REMOVAL TUN CV CATH W/O FL  07/21/2020   IR REMOVAL TUN CV CATH W/O FL  07/19/2021   IR US GUIDE VASC ACCESS RIGHT  07/24/2020   IR US GUIDE VASC ACCESS RIGHT  07/21/2021   LEFT HEART CATH AND CORONARY ANGIOGRAPHY N/A 05/01/2021   Procedure: LEFT HEART CATH AND CORONARY ANGIOGRAPHY;  Surgeon: [name], MD;  Location: [place] CV LAB;  Service: Cardiovascular;  Laterality: N/A;   LOWER EXTREMITY ANGIOGRAM Left 07/26/2013   Procedure: LOWER EXTREMITY ANGIOGRAM;  Surgeon: [name] Graymoor-Devondale, MD;  Location: [facility] CATH LAB;  Service: Cardiovascular;  Laterality: Left;   Family History  Problem Relation Age of Onset   Cancer Mother 47       breast and bone   Cancer Father 92       prostate   Hypertension Sister    Bleeding Disorder Sister    Cancer Cousin 87       breast cancer    Hypertension Daughter    Breast cancer Neg Hx     Social History   Tobacco Use   Smoking status: Some Days    Packs/day: 0.50    Years: 50.00    Pack years: 25.00    Types: Cigarettes    Last attempt to quit: 02/03/2021    Years since quitting: 0.6   Smokeless tobacco: Former  Substance Use Topics   Alcohol use: No    Alcohol/week: 0.0 standard drinks    Comment: she used to drink alcohol, quit in 2010; h/o heavy use    Marital Status: Single   ROS  Review of Systems  Constitutional: Negative for malaise/fatigue and weight gain.  Cardiovascular:  Positive for chest pain and palpitations. Negative for claudication, leg swelling, near-syncope, orthopnea, paroxysmal nocturnal dyspnea and syncope.  Respiratory:  Negative for shortness of breath.   Neurological:  Negative for dizziness.   Objective  Blood pressure 119/78, pulse 92, temperature 97.9 F (36.6 C), resp. rate 16, height 5' 1"   (1.549 m), weight 200 lb 9.6 oz (91 kg), SpO2 96 %.  Vitals with BMI 09/13/2021 09/04/2021 08/09/2021  Height 5' 1"  - 5' 1"   Weight 200 lbs 10 oz 200 lbs 13 oz 199 lbs 12 oz  BMI 37.92 16.38 45.36  Systolic 468 032 122  Diastolic 78 77 82  Pulse 92 105 106    Physical Exam Vitals reviewed.  Constitutional:      Appearance: She is obese.  HENT:     Head: Normocephalic and atraumatic.  Cardiovascular:     Rate and Rhythm: Normal rate and regular rhythm.     Pulses: Intact distal pulses.     Heart sounds: S1 normal and S2 normal. Murmur heard.  Harsh midsystolic murmur is present at the upper right sternal border.    No  friction rub. No gallop.     Comments: Tunneled dialysis catheter in place  Pulmonary:     Effort: Pulmonary effort is normal. No respiratory distress.     Breath sounds: No wheezing, rhonchi or rales.  Musculoskeletal:     Right lower leg: No edema.     Left lower leg: No edema.  Skin:    General: Skin is warm and dry.  Neurological:     Mental Status: She is alert.   Physical exam unchanged compared to last office visit.  Laboratory examination:   Recent Labs    07/20/21 0149 07/21/21 0120 07/22/21 0046  NA 136 133* 134*  K 3.8 3.6 4.0  CL 102 101 97*  CO2 21* 17* 22  GLUCOSE 121* 126* 78  BUN 34* 42* 18  CREATININE 8.09* 9.47* 6.33*  CALCIUM 6.9* 7.0* 7.8*  GFRNONAA 5* 4* 7*   CrCl cannot be calculated ([name] most recent lab result is older than the maximum 21 days allowed.).  CMP Latest Ref Rng & Units 07/22/2021 07/21/2021 07/20/2021  Glucose 70 - 99 mg/dL 78 126(H) 121(H)  BUN 8 - 23 mg/dL 18 42(H) 34(H)  Creatinine 0.44 - 1.00 mg/dL 6.33(H) 9.47(H) 8.09(H)  Sodium 135 - 145 mmol/L 134(L) 133(L) 136  Potassium 3.5 - 5.1 mmol/L 4.0 3.6 3.8  Chloride 98 - 111 mmol/L 97(L) 101 102  CO2 22 - 32 mmol/L 22 17(L) 21(L)  Calcium 8.9 - 10.3 mg/dL 7.8(L) 7.0(L) 6.9(L)  Total Protein 6.5 - 8.1 g/dL - - -  Total Bilirubin 0.3 - 1.2 mg/dL - - -   Alkaline Phos 38 - 126 U/L - - -  AST 15 - 41 U/L - - -  ALT 0 - 44 U/L - - -   CBC Latest Ref Rng & Units 07/22/2021 07/21/2021 07/20/2021  WBC 4.0 - 10.5 K/uL 19.9(H) 15.5(H) 13.2(H)  Hemoglobin 12.0 - 15.0 g/dL 10.2(L) 8.9(L) 9.3(L)  Hematocrit 36.0 - 46.0 % 32.5(L) 27.7(L) 28.8(L)  Platelets 150 - 400 K/uL 184 206 160    Lipid Panel Recent Labs    03/15/21 1143  CHOL 183  TRIG 159*  LDLCALC 100*  HDL 55  CHOLHDL 3.3    HEMOGLOBIN A1C Lab Results  Component Value Date   HGBA1C 6.2 (H) 07/16/2021   MPG 131.24 07/16/2021   TSH No results for input(s): TSH in the last 8760 hours.  External labs:   06/04/2018: HDL 63, LDL 97, total cholesterol 191, triglycerides 155  Allergies   Allergies  Allergen Reactions   Carnosine     Other reaction(s): Unknown   Gadolinium Derivatives Hives and Other (See Comments)    HIVES, Desc: HIVES W/ "DYE" USED FOR 1ST CT SCAN BUT NOT 2ND, NO PREMEDS USED, PT UNCERTAIN OF CIRCUMSTANCES,,?POSSIBLE MRI CONTRAST ALLERGY, ALL STUDIES DONE "SOMEWHERE" IN [place]//A.C., Onset Date: [id number]   Iohexol Other (See Comments)     Code: HIVES, Desc: HIVES W/ "DYE" USED FOR 1ST CT SCAN BUT NOT 2ND, NO PREMEDS USED, PT UNCERTAIN OF CIRCUMSTANCES,,?POSSIBLE MRI CONTRAST ALLERGY, ALL STUDIES DONE "SOMEWHERE" IN [place]//A.C., Onset Date: [id number]    Iodine Hives   Naltrexone     Other reaction(s): Unknown    Medications Prior to Visit:   Outpatient Medications Prior to Visit  Medication Sig Dispense Refill   acetaminophen (TYLENOL) 500 MG tablet Take 1,000 mg by mouth 2 (two) times daily as needed for moderate pain or headache.     albuterol (VENTOLIN HFA) 108 (90 Base) MCG/ACT inhaler  Inhale 2 puffs into the lungs every 6 (six) hours as needed for wheezing or shortness of breath. 18 g 0   apixaban (ELIQUIS) 2.5 MG TABS tablet Take 1 tablet (2.5 mg total) by mouth 2 (two) times daily. 60 tablet 0   Ascorbic Acid (VITAMIN C) 100 MG tablet  Take 100 mg by mouth daily.     atorvastatin (LIPITOR) 10 MG tablet Take 1 tablet (10 mg total) by mouth daily. 30 tablet 2   AURYXIA 1 GM 210 MG(Fe) tablet Take 420-840 mg by mouth with breakfast, with lunch, and with evening meal. Taking  2 tablets daily (437m) with each snack     Biotin 10000 MCG TABS Take 10,000 mcg by mouth daily.      ceFAZolin (ANCEF) IVPB Inject 2 g into the vein every Monday, Wednesday, and Friday with hemodialysis. Indication:  bacteremia First Dose: No Last Day of Therapy:  08/29/21 Labs - Once weekly:  CBC/D and BMP, Labs - Every other week:  ESR and CRP Method of administration: IV Push Method of administration may be changed at the discretion of home infusion pharmacist based upon assessment of the patient and/or caregiver's ability to self-administer the medication ordered. (Patient taking differently: Inject 2 g into the vein every Monday, Wednesday, and Friday with hemodialysis. Indication:  bacteremia First Dose: No Last Day of Therapy:  08/29/21 Labs - Once weekly:  CBC/D and BMP, Labs - Every other week:  ESR and CRP Method of administration: IV Push Method of administration may be changed at the discretion of home infusion pharmacist based upon assessment of the patient and/or caregiver's ability to self-administer the medication ordered.) 17 Units 0   lidocaine (LIDODERM) 5 % Place 1 patch onto the skin daily as needed. Remove & Discard patch within 12 hours or as directed by MD 30 patch 2   meclizine (ANTIVERT) 12.5 MG tablet Take 1 tablet (12.5 mg total) by mouth 3 (three) times daily as needed for dizziness. 30 tablet 0   multivitamin (RENA-VIT) TABS tablet Take 1 tablet by mouth at bedtime.     mupirocin ointment (BACTROBAN) 2 % Apply 1 application topically daily as needed (skin bumps). 22 g 2   Nutritional Supplements (,FEEDING SUPPLEMENT, PROSOURCE PLUS) liquid Take 30 mLs by mouth 2 (two) times daily between meals. 60 mL 0   omeprazole (PRILOSEC) 20  MG capsule Take 1 capsule (20 mg total) by mouth daily. 30 capsule 1   oxyCODONE-acetaminophen (PERCOCET) 5-325 MG tablet Take 1 tablet by mouth every 4 (four) hours as needed for severe pain. 30 tablet 0   vitamin E 200 UNIT capsule Take 200 Units by mouth daily.     aspirin EC 81 MG EC tablet Take 1 tablet (81 mg total) by mouth daily. Swallow whole. 30 tablet 11   melatonin 3 MG TABS tablet Take 1 tablet (3 mg total) by mouth at bedtime as needed. 30 tablet 2   docusate sodium (COLACE) 100 MG capsule Take 1 capsule (100 mg total) by mouth 2 (two) times daily as needed for mild constipation. (Patient not taking: Reported on 08/08/2021) 10 capsule 0   polyethylene glycol (MIRALAX / GLYCOLAX) 17 g packet Take 17 g by mouth daily as needed for moderate constipation. (Patient not taking: Reported on 08/08/2021) 14 each 0   No facility-administered medications prior to visit.   Final Medications at End of Visit    Current Meds  Medication Sig   acetaminophen (TYLENOL) 500 MG tablet Take 1,000  mg by mouth 2 (two) times daily as needed for moderate pain or headache.   albuterol (VENTOLIN HFA) 108 (90 Base) MCG/ACT inhaler Inhale 2 puffs into the lungs every 6 (six) hours as needed for wheezing or shortness of breath.   apixaban (ELIQUIS) 2.5 MG TABS tablet Take 1 tablet (2.5 mg total) by mouth 2 (two) times daily.   Ascorbic Acid (VITAMIN C) 100 MG tablet Take 100 mg by mouth daily.   atorvastatin (LIPITOR) 10 MG tablet Take 1 tablet (10 mg total) by mouth daily.   AURYXIA 1 GM 210 MG(Fe) tablet Take 420-840 mg by mouth with breakfast, with lunch, and with evening meal. Taking  2 tablets daily ($RemoveBefore'420mg'TsIKJYHYDndSm$ ) with each snack   Biotin 10000 MCG TABS Take 10,000 mcg by mouth daily.    carvedilol (COREG) 6.25 MG tablet Take 1 tablet (6.25 mg total) by mouth 2 (two) times daily.   ceFAZolin (ANCEF) IVPB Inject 2 g into the vein every Monday, Wednesday, and Friday with hemodialysis. Indication:  bacteremia First  Dose: No Last Day of Therapy:  08/29/21 Labs - Once weekly:  CBC/D and BMP, Labs - Every other week:  ESR and CRP Method of administration: IV Push Method of administration may be changed at the discretion of home infusion pharmacist based upon assessment of the patient and/or caregiver's ability to self-administer the medication ordered. (Patient taking differently: Inject 2 g into the vein every Monday, Wednesday, and Friday with hemodialysis. Indication:  bacteremia First Dose: No Last Day of Therapy:  08/29/21 Labs - Once weekly:  CBC/D and BMP, Labs - Every other week:  ESR and CRP Method of administration: IV Push Method of administration may be changed at the discretion of home infusion pharmacist based upon assessment of the patient and/or caregiver's ability to self-administer the medication ordered.)   lidocaine (LIDODERM) 5 % Place 1 patch onto the skin daily as needed. Remove & Discard patch within 12 hours or as directed by MD   meclizine (ANTIVERT) 12.5 MG tablet Take 1 tablet (12.5 mg total) by mouth 3 (three) times daily as needed for dizziness.   multivitamin (RENA-VIT) TABS tablet Take 1 tablet by mouth at bedtime.   mupirocin ointment (BACTROBAN) 2 % Apply 1 application topically daily as needed (skin bumps).   Nutritional Supplements (,FEEDING SUPPLEMENT, PROSOURCE PLUS) liquid Take 30 mLs by mouth 2 (two) times daily between meals.   omeprazole (PRILOSEC) 20 MG capsule Take 1 capsule (20 mg total) by mouth daily.   oxyCODONE-acetaminophen (PERCOCET) 5-325 MG tablet Take 1 tablet by mouth every 4 (four) hours as needed for severe pain.   vitamin E 200 UNIT capsule Take 200 Units by mouth daily.   [DISCONTINUED] aspirin EC 81 MG EC tablet Take 1 tablet (81 mg total) by mouth daily. Swallow whole.   [DISCONTINUED] melatonin 3 MG TABS tablet Take 1 tablet (3 mg total) by mouth at bedtime as needed.   Radiology:   No results found.  Cardiac Studies:   Echocardiogram  07/17/2021:  1. Left ventricular ejection fraction, by estimation, is 70 to 75%. The  left ventricle has hyperdynamic function. The left ventricle has no  regional wall motion abnormalities. Left ventricular diastolic parameters  are consistent with Grade I diastolic  dysfunction (impaired relaxation). Elevated left atrial pressure.   2. Right ventricular systolic function is normal. The right ventricular  size is normal. The estimated right ventricular systolic pressure is 00.1  mmHg.   3. Left atrial size was mildly dilated.  4. The mitral valve is normal in structure. No evidence of mitral valve  regurgitation. Moderate mitral annular calcification.   5. The aortic valve is tricuspid. There is mild calcification of the  aortic valve. There is mild thickening of the aortic valve. Aortic valve  regurgitation is not visualized. Mild to moderate aortic valve  sclerosis/calcification is present, without any  evidence of aortic stenosis. Aortic valve mean gradient measures 14.0  mmHg. Aortic valve Vmax measures 2.52 m/s.   6. The inferior vena cava is normal in size with greater than 50%  respiratory variability, suggesting right atrial pressure of 3 mmHg.   Carotid artery duplex 05/08/2021:  No flow noted in the right ICA (Total occlusion).  Stenosis in the left internal carotid artery (16-49%). Stenosis in the  left external carotid artery (<50%).  Antegrade right vertebral artery flow. Antegrade left vertebral artery  flow.  Compared to the study done on 11/16/2020, right ACA occlusion is new.    Previously there was no significant disease. Follow up in six months is  appropriate if clinically indicated.  (patient was brought back next day to confirm occlusion, patient  asymptomatic, occlusion confirmed, will arrange OV).  Left heart catheterization 05/01/2021: Moderate disease in the right coronary artery and circumflex coronary artery with diffuse coronary calcification, minimal  disease in the LAD.  Normal LV systolic function and normal LVEDP.  No pressure gradient across aortic valve.   Recommendation: Evaluation for noncardiac causes of chest pain is indicated.  30 - 40 mL contrast utilized.  Right femoral arterial access closed with minx. Anticoagulation with Eliquis for hypercoagulable state.  No aspirin although she has moderate coronary disease in view of increased risk of bleeding.  PCV MYOCARDIAL PERFUSION WO LEXISCAN 11/20/2020 Lexiscan not administered at patient's request. Incomplete study. EKG tracing not available for review. Rest images show mild decrease in tracer update in basal inferior myocardium. Consider alternate ischemia studies, if clinically indicated.  Carotid artery duplex 11/16/2020:  Stenosis in the right internal carotid artery (1-15%).  Stenosis in the left internal carotid artery (50-69%).  Antegrade right vertebral artery flow. Antegrade left vertebral artery flow.  Follow up in six months is appropriate if clinically indicated.  Nuclear stress test Lexiscan Myoview 08/05/2013: Resting EKG NSR, poor R wave progression.  Stress EKG was nondiagnostic for ischemia.  No ST-T changes of ischemia noted with pharmacologic stress testing.  Stress symptoms included shortness of breath and nausea.  Stress terminated due to completion of protocol. The perfusion study demonstrated normal isotope uptake both at rest and stress.  There was no evidence of ischemia or scar.  Dynamic gated images revealed normal wall motion and endocardial thickening.  Left ventricular ejection fraction estimated to be 75%.  No significant change from 01/03/2012.    CT neck 10/31/2011: Normal soft tissues.  Mild pulmonary artery dilation suggest hypertension.  Sleep study 2007: Sleep apnea, noncompliant with CPAP uses supplemental oxygen only.  EKG:  09/13/2021: Sinus rhythm with first-degree AV block at a rate of 84 bpm.  Left atrial enlargement.  Left axis.  Incomplete  right bundle branch block.  Nonspecific T wave abnormality.  03/01/2021: Sinus rhythm at a rate of 96 bpm with first-degree AV block.  Biatrial enlargement.  Left axis, left anterior fascicular block.  T wave abnormality, cannot exclude anterolateral ischemia.  PR depression, suggestive of pericarditis.  11/14/2020: Sinus rhythm at a rate of 82 bpm with first-degree AV block, biatrial enlargement.  Left axis deviation, left anterior fascicular block.  Poor R wave progression, cannot exclude anteroseptal infarct old.  Cannot exclude inferior ischemia.  Incomplete right bundle branch block.  Compared to EKG 10/30/2020 (external), no significant change.  12/03/2018: Sinus tachycardia at 113 beats per minute with first-degree AV block.  Normal axis.  PR WP cannot exclude anterior infarct old.  No evidence of ischemia.  Except for tachycardia no changes noted to EKG.  Assessment     ICD-10-CM   1. Coronary artery disease involving native coronary artery of native heart without angina pectoris  I25.10 EKG 12-Lead    Lipid Panel With LDL/HDL Ratio    2. Chronic diastolic heart failure (HCC)  I50.32 EKG 12-Lead    Brain natriuretic peptide    3. Essential hypertension  I10     4. ESRD on dialysis ([place])  N18.6    Z99.2     5. Hypercholesterolemia  E78.00 Lipid Panel With LDL/HDL Ratio     Medications Discontinued During This Encounter  Medication Reason   polyethylene glycol (MIRALAX / GLYCOLAX) 17 g packet Error   docusate sodium (COLACE) 100 MG capsule Error   melatonin 3 MG TABS tablet Error   aspirin EC 81 MG EC tablet Discontinued by provider    Meds ordered this encounter  Medications   carvedilol (COREG) 6.25 MG tablet    Sig: Take 1 tablet (6.25 mg total) by mouth 2 (two) times daily.    Dispense:  180 tablet    Refill:  3   Recommendations:   Makyla Bye is a 65 y.o. AAfrican-American female with history of obesity, hypertension, hyperlipidemia, tobacco use, stage IV  kidney disease on dialysis, obstructive sleep apnea not on CPAP, COPD and severe peripheral arterial disease with history of left femoral-popliteal bypass surgery by Dr. Victorino Dike in 2014.  Patient is on chronic anticoagulation with Eliquis due to recurrent DVT and PE in 2014 and 2016.  Patient is followed by hematology for hypercoagulable state.  She is currently undergoing hemodialysis on a Monday/Wednesday/Friday schedule.  Patient presents for 57-monthfollow-up of CAD and hyperlipidemia.  At last office visit ordered repeat lipid profile testing.  Since last office visit patient has been hospitalized 05/28/2021 - 05/31/2021 with hyperkalemia and painful rash concerning for erythema nodosum.  Patient then again admitted 07/16/2021 - 07/22/2021 with septic shock secondary to MSSA bacteremia, for which she continues to follow with infectious disease.  I personally reviewed hospital records. Echocardiogram remains stable and EKG today is relatively unchanged compared to previous. Have a low suspicion patient's atypical chest pain is related to underlying cardiac etiology, although she does have known moderate CAD which cannot be ruled out as etiology. However, recent cardiac cath showed non-obstructive CAD. Recommend watchful waiting at this time. Counseled patient regarding signs and symptoms that would warrant urgent or emergent evaluation, she verbalized understanding and agreement.   In regard to palpitations, symptoms are rare and suggestive of PVC/PAC. Encouraged patient to notify our office if symptoms become more severe or more frequent. Do not feel monitor is indicated at this time.   Patient does report elevated blood pressure on non-dialysis days. Her heart rate is also elevated compared to previous visits. Will therefore resume carvedilol 6.25 mg twice daily. Will obtain BNP as well as repeat lipid profile testing.   Could consider addition of Zetia in the future for further management of  hyperlipidemia.  Patient would also be a good candidate for Repatha or Praluent, however she is very resistant to the idea of injectable medications.  Patient is notably not on ACE inhibitor or ARB due to renal disease.  Follow up in 8 weeks, sooner if needed, for hyperlipidemia and hypertension.    Alethia Berthold, PA-C 09/13/2021, 12:35 PM Office: [phone]

## 2021-09-13 ENCOUNTER — Encounter: Payer: Self-pay | Admitting: Student

## 2021-09-13 ENCOUNTER — Ambulatory Visit: Payer: Medicare Other | Admitting: Student

## 2021-09-13 ENCOUNTER — Other Ambulatory Visit: Payer: Self-pay

## 2021-09-13 VITALS — BP 119/78 | HR 92 | Temp 97.9°F | Resp 16 | Ht 61.0 in | Wt 200.6 lb

## 2021-09-13 DIAGNOSIS — E7800 Pure hypercholesterolemia, unspecified: Secondary | ICD-10-CM | POA: Diagnosis not present

## 2021-09-13 DIAGNOSIS — I10 Essential (primary) hypertension: Secondary | ICD-10-CM

## 2021-09-13 DIAGNOSIS — I5032 Chronic diastolic (congestive) heart failure: Secondary | ICD-10-CM | POA: Diagnosis not present

## 2021-09-13 DIAGNOSIS — Z992 Dependence on renal dialysis: Secondary | ICD-10-CM | POA: Diagnosis not present

## 2021-09-13 DIAGNOSIS — I2510 Atherosclerotic heart disease of native coronary artery without angina pectoris: Secondary | ICD-10-CM | POA: Diagnosis not present

## 2021-09-13 DIAGNOSIS — N186 End stage renal disease: Secondary | ICD-10-CM

## 2021-09-13 DIAGNOSIS — I129 Hypertensive chronic kidney disease with stage 1 through stage 4 chronic kidney disease, or unspecified chronic kidney disease: Secondary | ICD-10-CM | POA: Diagnosis not present

## 2021-09-13 MED ORDER — CARVEDILOL 6.25 MG PO TABS: 6.2500 mg | ORAL_TABLET | Freq: Two times a day (BID) | ORAL | 3 refills | Status: DC

## 2021-09-14 DIAGNOSIS — D689 Coagulation defect, unspecified: Secondary | ICD-10-CM | POA: Diagnosis not present

## 2021-09-14 DIAGNOSIS — N186 End stage renal disease: Secondary | ICD-10-CM | POA: Diagnosis not present

## 2021-09-14 DIAGNOSIS — N2581 Secondary hyperparathyroidism of renal origin: Secondary | ICD-10-CM | POA: Diagnosis not present

## 2021-09-14 DIAGNOSIS — E875 Hyperkalemia: Secondary | ICD-10-CM | POA: Diagnosis not present

## 2021-09-14 DIAGNOSIS — Z992 Dependence on renal dialysis: Secondary | ICD-10-CM | POA: Diagnosis not present

## 2021-09-14 DIAGNOSIS — D631 Anemia in chronic kidney disease: Secondary | ICD-10-CM | POA: Diagnosis not present

## 2021-09-14 DIAGNOSIS — T82594A Other mechanical complication of infusion catheter, initial encounter: Secondary | ICD-10-CM | POA: Diagnosis not present

## 2021-09-17 DIAGNOSIS — T82594A Other mechanical complication of infusion catheter, initial encounter: Secondary | ICD-10-CM | POA: Diagnosis not present

## 2021-09-17 DIAGNOSIS — D631 Anemia in chronic kidney disease: Secondary | ICD-10-CM | POA: Diagnosis not present

## 2021-09-17 DIAGNOSIS — N186 End stage renal disease: Secondary | ICD-10-CM | POA: Diagnosis not present

## 2021-09-17 DIAGNOSIS — Z992 Dependence on renal dialysis: Secondary | ICD-10-CM | POA: Diagnosis not present

## 2021-09-17 DIAGNOSIS — E875 Hyperkalemia: Secondary | ICD-10-CM | POA: Diagnosis not present

## 2021-09-17 DIAGNOSIS — D689 Coagulation defect, unspecified: Secondary | ICD-10-CM | POA: Diagnosis not present

## 2021-09-17 DIAGNOSIS — N2581 Secondary hyperparathyroidism of renal origin: Secondary | ICD-10-CM | POA: Diagnosis not present

## 2021-09-19 DIAGNOSIS — E875 Hyperkalemia: Secondary | ICD-10-CM | POA: Diagnosis not present

## 2021-09-19 DIAGNOSIS — N186 End stage renal disease: Secondary | ICD-10-CM | POA: Diagnosis not present

## 2021-09-19 DIAGNOSIS — D689 Coagulation defect, unspecified: Secondary | ICD-10-CM | POA: Diagnosis not present

## 2021-09-19 DIAGNOSIS — T82594A Other mechanical complication of infusion catheter, initial encounter: Secondary | ICD-10-CM | POA: Diagnosis not present

## 2021-09-19 DIAGNOSIS — N2581 Secondary hyperparathyroidism of renal origin: Secondary | ICD-10-CM | POA: Diagnosis not present

## 2021-09-19 DIAGNOSIS — D631 Anemia in chronic kidney disease: Secondary | ICD-10-CM | POA: Diagnosis not present

## 2021-09-19 DIAGNOSIS — Z992 Dependence on renal dialysis: Secondary | ICD-10-CM | POA: Diagnosis not present

## 2021-09-21 DIAGNOSIS — N2581 Secondary hyperparathyroidism of renal origin: Secondary | ICD-10-CM | POA: Diagnosis not present

## 2021-09-21 DIAGNOSIS — D631 Anemia in chronic kidney disease: Secondary | ICD-10-CM | POA: Diagnosis not present

## 2021-09-21 DIAGNOSIS — E875 Hyperkalemia: Secondary | ICD-10-CM | POA: Diagnosis not present

## 2021-09-21 DIAGNOSIS — N186 End stage renal disease: Secondary | ICD-10-CM | POA: Diagnosis not present

## 2021-09-21 DIAGNOSIS — T82594A Other mechanical complication of infusion catheter, initial encounter: Secondary | ICD-10-CM | POA: Diagnosis not present

## 2021-09-21 DIAGNOSIS — D689 Coagulation defect, unspecified: Secondary | ICD-10-CM | POA: Diagnosis not present

## 2021-09-21 DIAGNOSIS — Z992 Dependence on renal dialysis: Secondary | ICD-10-CM | POA: Diagnosis not present

## 2021-09-24 DIAGNOSIS — N2581 Secondary hyperparathyroidism of renal origin: Secondary | ICD-10-CM | POA: Diagnosis not present

## 2021-09-24 DIAGNOSIS — Z992 Dependence on renal dialysis: Secondary | ICD-10-CM | POA: Diagnosis not present

## 2021-09-24 DIAGNOSIS — E875 Hyperkalemia: Secondary | ICD-10-CM | POA: Diagnosis not present

## 2021-09-24 DIAGNOSIS — N186 End stage renal disease: Secondary | ICD-10-CM | POA: Diagnosis not present

## 2021-09-24 DIAGNOSIS — D631 Anemia in chronic kidney disease: Secondary | ICD-10-CM | POA: Diagnosis not present

## 2021-09-24 DIAGNOSIS — T82594A Other mechanical complication of infusion catheter, initial encounter: Secondary | ICD-10-CM | POA: Diagnosis not present

## 2021-09-24 DIAGNOSIS — D689 Coagulation defect, unspecified: Secondary | ICD-10-CM | POA: Diagnosis not present

## 2021-09-26 ENCOUNTER — Other Ambulatory Visit: Payer: Self-pay | Admitting: Hematology

## 2021-09-26 DIAGNOSIS — D631 Anemia in chronic kidney disease: Secondary | ICD-10-CM | POA: Diagnosis not present

## 2021-09-26 DIAGNOSIS — E875 Hyperkalemia: Secondary | ICD-10-CM | POA: Diagnosis not present

## 2021-09-26 DIAGNOSIS — N186 End stage renal disease: Secondary | ICD-10-CM | POA: Diagnosis not present

## 2021-09-26 DIAGNOSIS — K219 Gastro-esophageal reflux disease without esophagitis: Secondary | ICD-10-CM

## 2021-09-26 DIAGNOSIS — D689 Coagulation defect, unspecified: Secondary | ICD-10-CM | POA: Diagnosis not present

## 2021-09-26 DIAGNOSIS — T82594A Other mechanical complication of infusion catheter, initial encounter: Secondary | ICD-10-CM | POA: Diagnosis not present

## 2021-09-26 DIAGNOSIS — Z992 Dependence on renal dialysis: Secondary | ICD-10-CM | POA: Diagnosis not present

## 2021-09-26 DIAGNOSIS — N2581 Secondary hyperparathyroidism of renal origin: Secondary | ICD-10-CM | POA: Diagnosis not present

## 2021-09-27 NOTE — Telephone Encounter (Signed)
Error

## 2021-09-28 DIAGNOSIS — D631 Anemia in chronic kidney disease: Secondary | ICD-10-CM | POA: Diagnosis not present

## 2021-09-28 DIAGNOSIS — I5032 Chronic diastolic (congestive) heart failure: Secondary | ICD-10-CM | POA: Diagnosis not present

## 2021-09-28 DIAGNOSIS — N2581 Secondary hyperparathyroidism of renal origin: Secondary | ICD-10-CM | POA: Diagnosis not present

## 2021-09-28 DIAGNOSIS — N186 End stage renal disease: Secondary | ICD-10-CM | POA: Diagnosis not present

## 2021-09-28 DIAGNOSIS — I10 Essential (primary) hypertension: Secondary | ICD-10-CM

## 2021-09-28 DIAGNOSIS — I129 Hypertensive chronic kidney disease with stage 1 through stage 4 chronic kidney disease, or unspecified chronic kidney disease: Secondary | ICD-10-CM | POA: Diagnosis not present

## 2021-09-28 DIAGNOSIS — D689 Coagulation defect, unspecified: Secondary | ICD-10-CM | POA: Diagnosis not present

## 2021-09-28 DIAGNOSIS — E875 Hyperkalemia: Secondary | ICD-10-CM | POA: Diagnosis not present

## 2021-09-28 DIAGNOSIS — Z992 Dependence on renal dialysis: Secondary | ICD-10-CM | POA: Diagnosis not present

## 2021-09-28 DIAGNOSIS — T82594A Other mechanical complication of infusion catheter, initial encounter: Secondary | ICD-10-CM | POA: Diagnosis not present

## 2021-10-01 DIAGNOSIS — D689 Coagulation defect, unspecified: Secondary | ICD-10-CM | POA: Diagnosis not present

## 2021-10-01 DIAGNOSIS — N2581 Secondary hyperparathyroidism of renal origin: Secondary | ICD-10-CM | POA: Diagnosis not present

## 2021-10-01 DIAGNOSIS — Z23 Encounter for immunization: Secondary | ICD-10-CM | POA: Diagnosis not present

## 2021-10-01 DIAGNOSIS — Z992 Dependence on renal dialysis: Secondary | ICD-10-CM | POA: Diagnosis not present

## 2021-10-01 DIAGNOSIS — N186 End stage renal disease: Secondary | ICD-10-CM | POA: Diagnosis not present

## 2021-10-01 DIAGNOSIS — E875 Hyperkalemia: Secondary | ICD-10-CM | POA: Diagnosis not present

## 2021-10-02 ENCOUNTER — Other Ambulatory Visit: Payer: Self-pay

## 2021-10-02 ENCOUNTER — Telehealth: Payer: Self-pay

## 2021-10-02 ENCOUNTER — Ambulatory Visit
Admission: RE | Admit: 2021-10-02 | Discharge: 2021-10-02 | Disposition: A | Discharge status: Home / Self Care | Payer: Medicare Other | Source: Ambulatory Visit | Attending: Hematology | Admitting: Hematology

## 2021-10-02 DIAGNOSIS — Z1231 Encounter for screening mammogram for malignant neoplasm of breast: Secondary | ICD-10-CM | POA: Diagnosis not present

## 2021-10-02 IMAGING — MG MM DIGITAL SCREENING BILAT W/ TOMO AND CAD
6 of 12 series · 6 of 36 positions shown · non-contrast
Comparison: Previous exam(s).

CLINICAL DATA: Screening.

EXAM:
DIGITAL SCREENING BILATERAL MAMMOGRAM WITH TOMOSYNTHESIS AND CAD
TECHNIQUE: Bilateral screening digital craniocaudal and mediolateral oblique
mammograms were obtained. Bilateral screening digital breast
tomosynthesis was performed. The images were evaluated with
computer-aided detection.

[L CC synth-2D (1 of 2)]
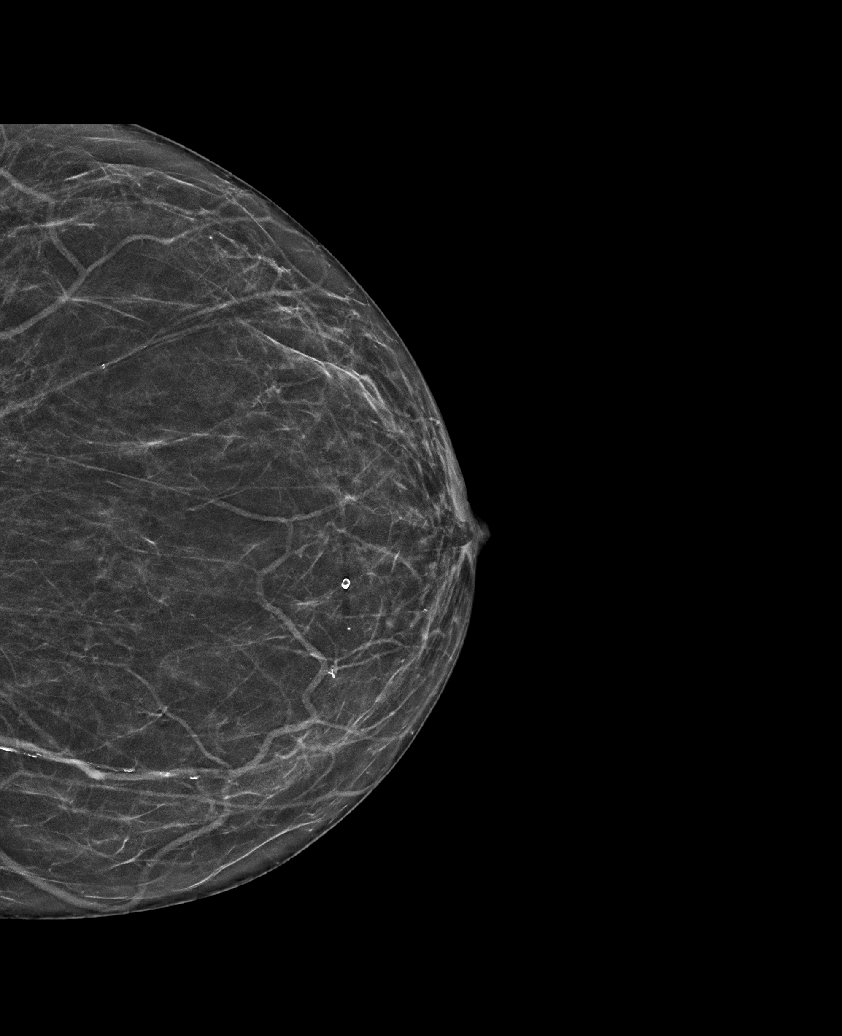

[L CC synth-2D (2 of 2)]
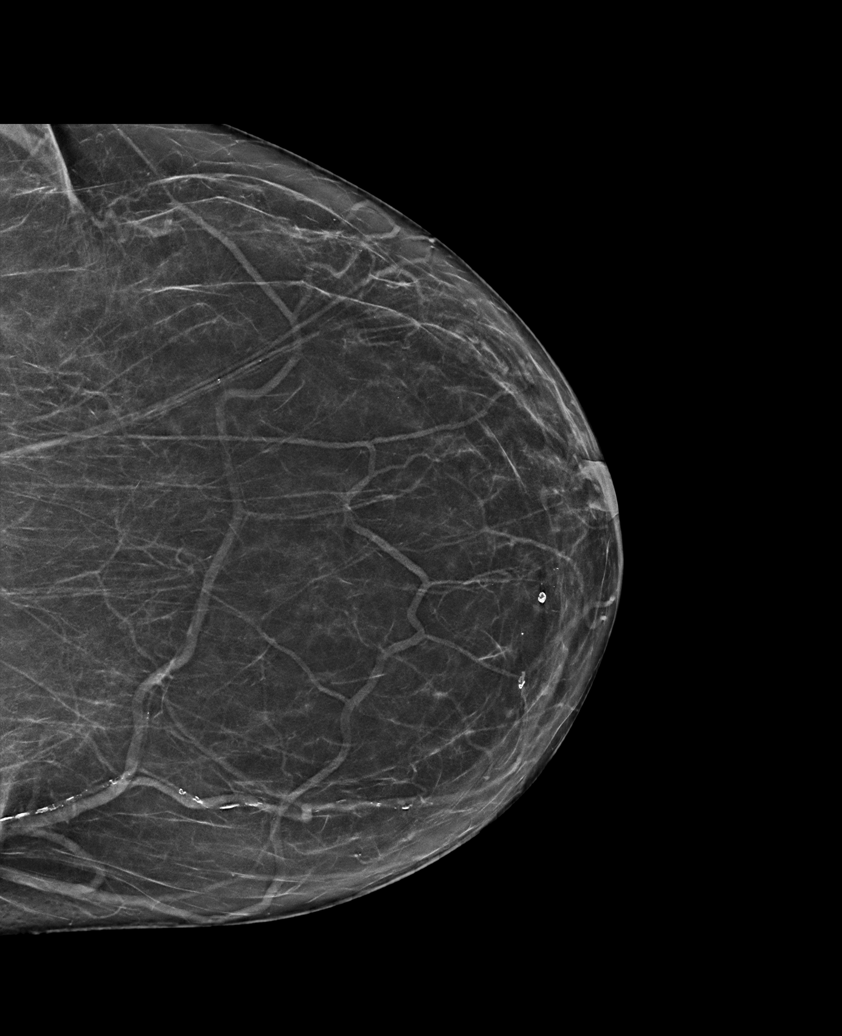

[R CC synth-2D (1 of 2)]
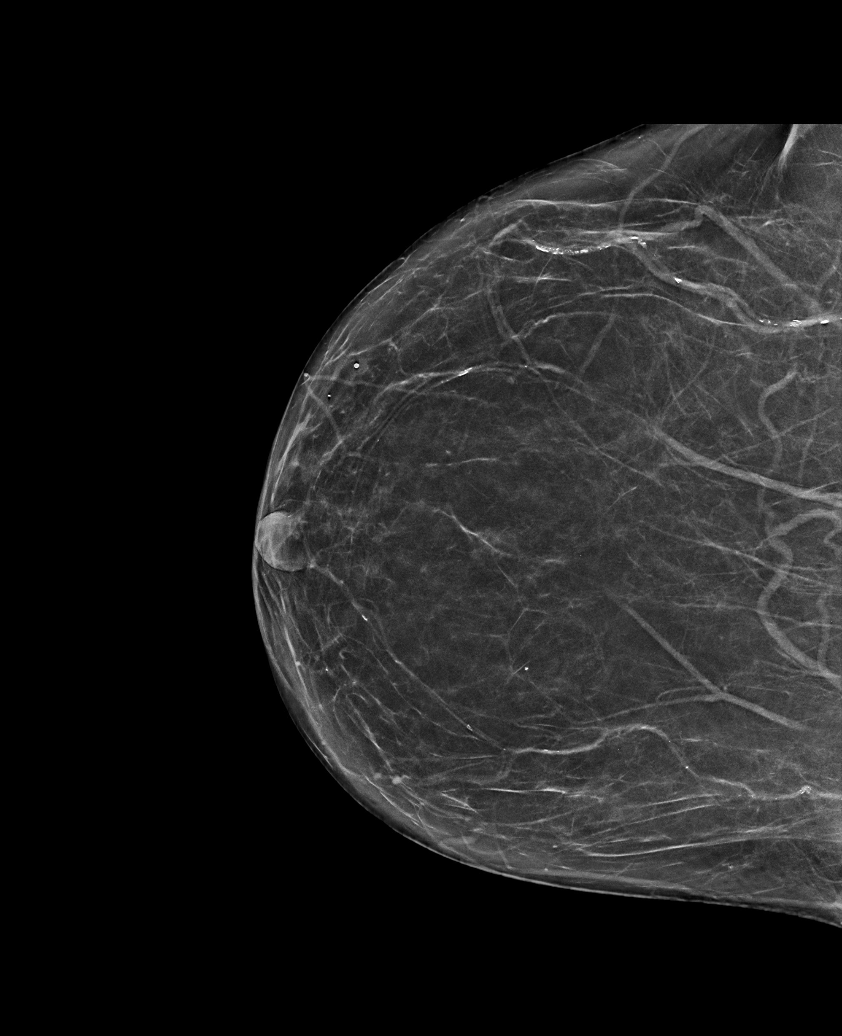

[R MLO synth-2D]
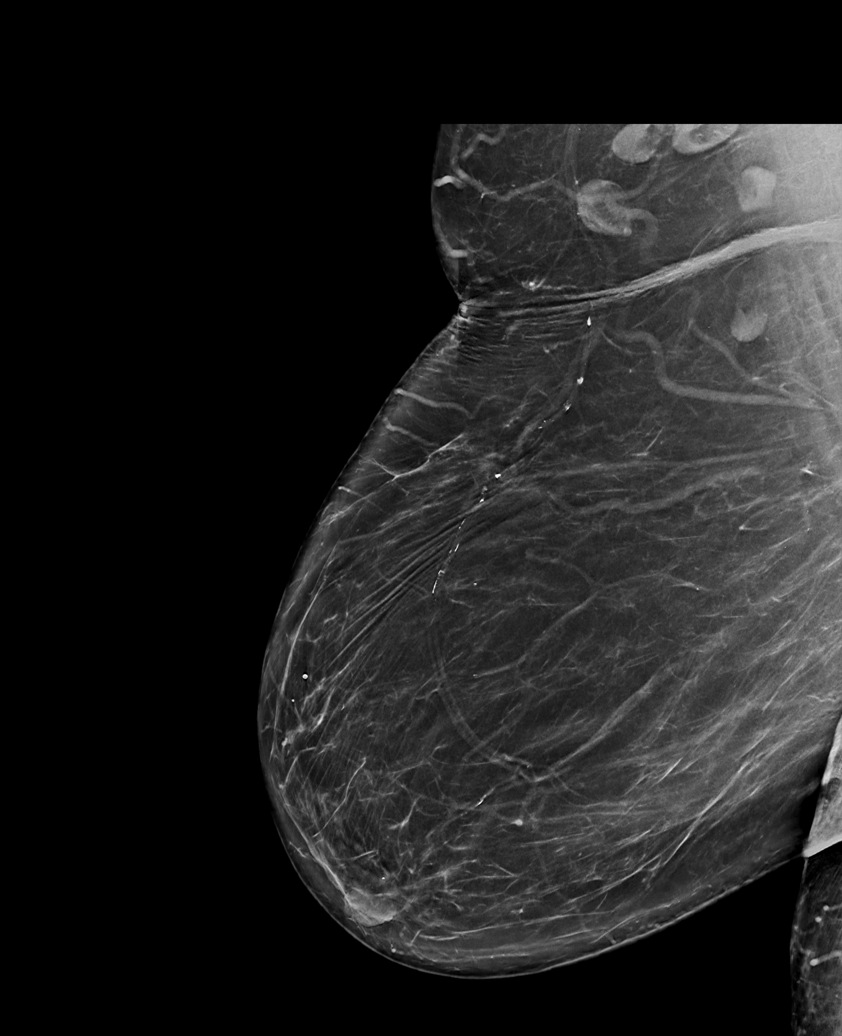

[L MLO synth-2D]
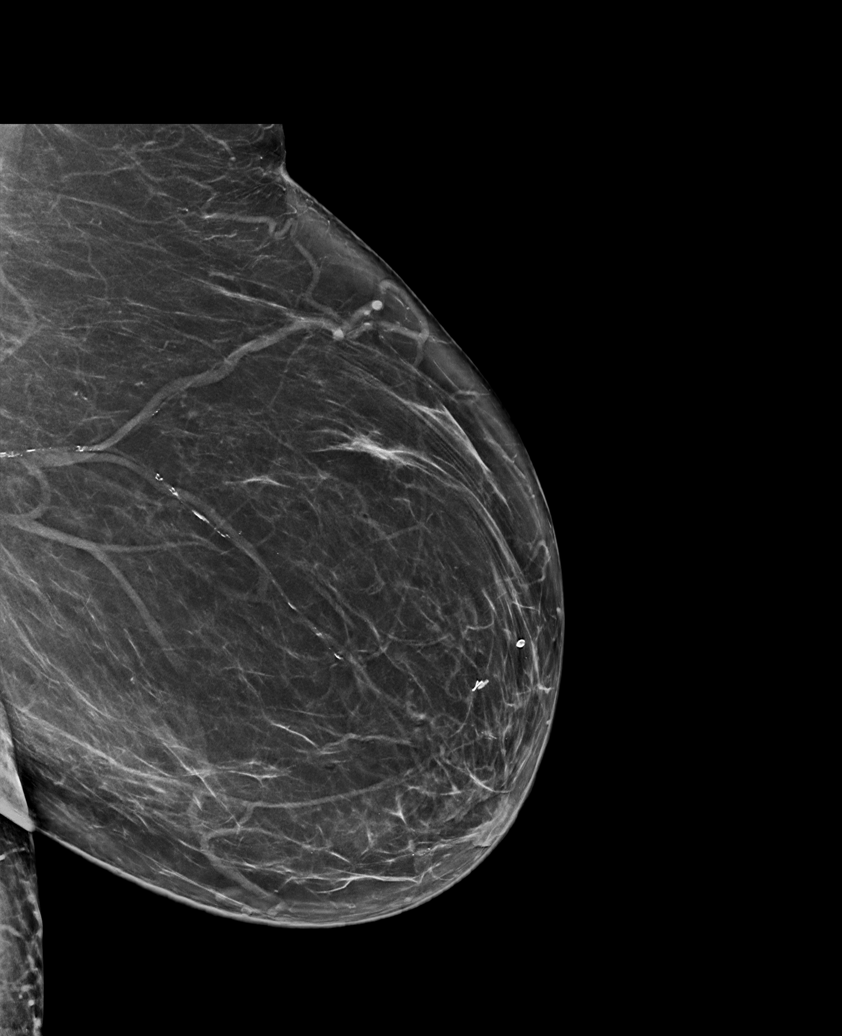

[R CC synth-2D (2 of 2)]
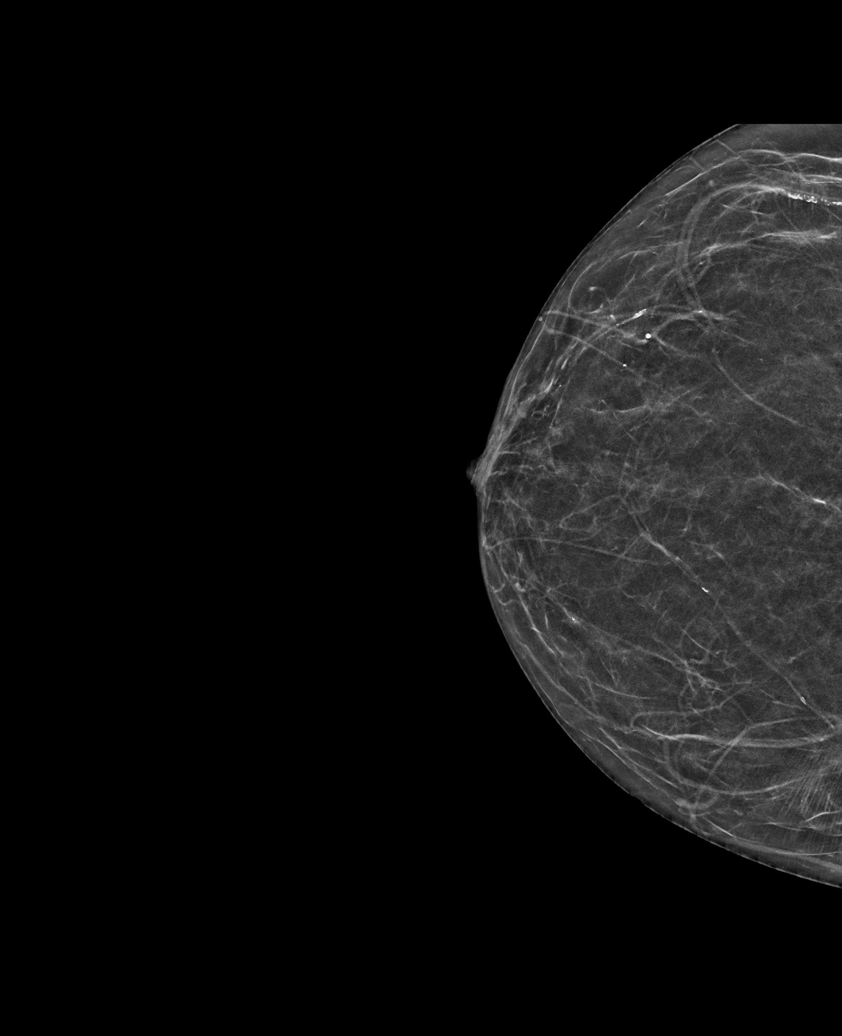

[6 of 36 positions shown; findings below may reference images not displayed]

ACR Breast Density Category b: There are scattered areas of
fibroglandular density.
FINDINGS: There are no findings suspicious for malignancy.
IMPRESSION: No mammographic evidence of malignancy. A result letter of this
screening mammogram will be mailed directly to the patient.

RECOMMENDATION:
Screening mammogram in one year. (Code:[BY])

BI-RADS CATEGORY  1: Negative.

## 2021-10-02 NOTE — Chronic Care Management (AMB) (Signed)
Chronic Care Management Pharmacy Assistant   Name: Whitney Walker  MRN: [id number] DOB: 02/15/1956  Reason for Encounter: Medication Review/ Medication coordination  Recent office visits:  09-04-2021 [name], RN (CCM)  Recent consult visits:  09-04-2021 [name], MD (infectious disease). Follow up.  09-13-2021 Alethia Berthold PA-C (Cardiology). Orders placed for EKG, Brain natriuretic peptide and lipid panel with LDL/HDL ratio. START coreg 6.25 mg twice daily. STOP aspirin, colace, melatonin and miralax.  Hospital visits:  Medication Reconciliation was completed by comparing discharge summary, patient's EMR and Pharmacy list, and upon discussion with patient.   Admitted to the hospital on 07-16-2021 due to Sepsis. Discharge date was 07-22-2021. Discharged from [place]?Medications Started at [facility] Discharge:?? Aspirin 81 mg daily Ancef inject 2 g into the vein every Monday, Wednesday and Friday with hemodialysis  Colace 100 mg twice daily as needed for mild constipation Lidoderm 5% one patch onto skin daily as needed. Discard patch after 12 hours Melatonin 3 mg at bedtime as needed Miralax 17 g as needed for constipation ProSource Plus   Medication Changes at Hospital Discharge: None   Medications Discontinued at Hospital Discharge: Amlodipine 5 mg Carvedilol 12.5 mg Nystatin powder Prednisone 10 mg   Medications that remain the same after Hospital Discharge:??  -All other medications will remain the same.       Hospital visits:  Medication Reconciliation was completed by comparing discharge summary, patient's EMR and Pharmacy list, and upon discussion with patient.   Admitted to the hospital on 05-28-2021 due to Hyperkalemia . Discharge date was 05-31-2021. Discharged from [place]?Medications Started at [facility] Discharge:?? Prednisone 10 MG- 4 tablets for 6 days, 3 tablets for 6 days, 2 tablets for  6 days then 1 tablet for 2 days   Medication Changes at Hospital Discharge: None   Medications Discontinued at Hospital Discharge: None   Admitted to the hospital on 05-01-2021 due to Acute deep vein thrombosis of femoral vein of left lower extremity. Discharge date was 05-01-2021. Discharged from [place]?Medications Started at [facility] Discharge:?? None   Medication Changes at Hospital Discharge: None   Medications Discontinued at Hospital Discharge: -Stopped Amoxicillin 500 MG. Stopped Ibuprofen 800 MG   Medications that remain the same after Hospital Discharge:?? -All other medications will remain the same    Medications: Outpatient Encounter Medications as of 10/02/2021  Medication Sig   acetaminophen (TYLENOL) 500 MG tablet Take 1,000 mg by mouth 2 (two) times daily as needed for moderate pain or headache.   albuterol (VENTOLIN HFA) 108 (90 Base) MCG/ACT inhaler Inhale 2 puffs into the lungs every 6 (six) hours as needed for wheezing or shortness of breath.   apixaban (ELIQUIS) 2.5 MG TABS tablet Take 1 tablet (2.5 mg total) by mouth 2 (two) times daily.   Ascorbic Acid (VITAMIN C) 100 MG tablet Take 100 mg by mouth daily.   atorvastatin (LIPITOR) 10 MG tablet Take 1 tablet (10 mg total) by mouth daily.   AURYXIA 1 GM 210 MG(Fe) tablet Take 420-840 mg by mouth with breakfast, with lunch, and with evening meal. Taking  2 tablets daily (418m) with each snack   Biotin 10000 MCG TABS Take 10,000 mcg by mouth daily.    carvedilol (COREG) 6.25 MG tablet Take 1 tablet (6.25 mg total) by mouth 2 (two) times daily.   ceFAZolin (ANCEF) IVPB Inject 2 g  into the vein every Monday, Wednesday, and Friday with hemodialysis. Indication:  bacteremia First Dose: No Last Day of Therapy:  08/29/21 Labs - Once weekly:  CBC/D and BMP, Labs - Every other week:  ESR and CRP Method of administration: IV Push Method of administration may be changed at the discretion of home  infusion pharmacist based upon assessment of the patient and/or caregiver's ability to self-administer the medication ordered. (Patient taking differently: Inject 2 g into the vein every Monday, Wednesday, and Friday with hemodialysis. Indication:  bacteremia First Dose: No Last Day of Therapy:  08/29/21 Labs - Once weekly:  CBC/D and BMP, Labs - Every other week:  ESR and CRP Method of administration: IV Push Method of administration may be changed at the discretion of home infusion pharmacist based upon assessment of the patient and/or caregiver's ability to self-administer the medication ordered.)   lidocaine (LIDODERM) 5 % Place 1 patch onto the skin daily as needed. Remove & Discard patch within 12 hours or as directed by MD   meclizine (ANTIVERT) 12.5 MG tablet Take 1 tablet (12.5 mg total) by mouth 3 (three) times daily as needed for dizziness.   multivitamin (RENA-VIT) TABS tablet Take 1 tablet by mouth at bedtime.   mupirocin ointment (BACTROBAN) 2 % Apply 1 application topically daily as needed (skin bumps).   Nutritional Supplements (,FEEDING SUPPLEMENT, PROSOURCE PLUS) liquid Take 30 mLs by mouth 2 (two) times daily between meals.   omeprazole (PRILOSEC) 20 MG capsule TAKE ONE CAPSULE BY MOUTH ONCE DAILY   oxyCODONE-acetaminophen (PERCOCET) 5-325 MG tablet Take 1 tablet by mouth every 4 (four) hours as needed for severe pain.   vitamin E 200 UNIT capsule Take 200 Units by mouth daily.   No facility-administered encounter medications on file as of 10/02/2021.  Reviewed chart for medication changes ahead of medication coordination call.  No OVs, Consults, or hospital visits since last care coordination call/Pharmacist visit. (If appropriate, list visit date, provider name)  No medication changes indicated OR if recent visit, treatment plan here.  BP Readings from Last 3 Encounters:  09/13/21 119/78  09/04/21 114/77  08/09/21 128/82    Lab Results  Component Value Date   HGBA1C  6.2 (H) 07/16/2021     Patient obtains medications through V[facility]  30 Days   Last adherence delivery included:  Atorvastatin 10 mg- 1 tablet daily Eliquis 2.5 mg twice daily Omeprazole 20 mg daily    Patient declined (meds) last month: Aspirin- patient states she gets that OTC Melatonin- Patient states she doesn't take   Patient is due for next adherence delivery on: 10-09-2021  Called patient and reviewed medications and coordinated delivery.  This delivery to include: Atorvastatin 10 mg- 1 tablet daily Eliquis 2.5 mg twice daily Omeprazole 20 mg daily  Patient declined the following medications (meds) due to (reason): Atorvastatin 10 mg- 1 tablet daily Eliquis 2.5 mg twice daily Omeprazole 20 mg daily  Patient needs refills for: Eliquis  [facility] yesterday stating no medications were needed on 10-09-2021   Care Gaps: Shingrix overdue Medicare wellness 08-09-2021 Covid booster overdue  Star Rating Drugs: Atorvastatin 20 mg- Last filled 09-06-2021 30 [facility]  [place] Clinical Pharmacist Assistant [phone]

## 2021-10-03 DIAGNOSIS — D689 Coagulation defect, unspecified: Secondary | ICD-10-CM | POA: Diagnosis not present

## 2021-10-03 DIAGNOSIS — Z23 Encounter for immunization: Secondary | ICD-10-CM | POA: Diagnosis not present

## 2021-10-03 DIAGNOSIS — N186 End stage renal disease: Secondary | ICD-10-CM | POA: Diagnosis not present

## 2021-10-03 DIAGNOSIS — Z992 Dependence on renal dialysis: Secondary | ICD-10-CM | POA: Diagnosis not present

## 2021-10-03 DIAGNOSIS — E875 Hyperkalemia: Secondary | ICD-10-CM | POA: Diagnosis not present

## 2021-10-03 DIAGNOSIS — N2581 Secondary hyperparathyroidism of renal origin: Secondary | ICD-10-CM | POA: Diagnosis not present

## 2021-10-05 DIAGNOSIS — Z992 Dependence on renal dialysis: Secondary | ICD-10-CM | POA: Diagnosis not present

## 2021-10-05 DIAGNOSIS — D689 Coagulation defect, unspecified: Secondary | ICD-10-CM | POA: Diagnosis not present

## 2021-10-05 DIAGNOSIS — E875 Hyperkalemia: Secondary | ICD-10-CM | POA: Diagnosis not present

## 2021-10-05 DIAGNOSIS — N2581 Secondary hyperparathyroidism of renal origin: Secondary | ICD-10-CM | POA: Diagnosis not present

## 2021-10-05 DIAGNOSIS — N186 End stage renal disease: Secondary | ICD-10-CM | POA: Diagnosis not present

## 2021-10-05 DIAGNOSIS — Z23 Encounter for immunization: Secondary | ICD-10-CM | POA: Diagnosis not present

## 2021-10-08 DIAGNOSIS — E875 Hyperkalemia: Secondary | ICD-10-CM | POA: Diagnosis not present

## 2021-10-08 DIAGNOSIS — N2581 Secondary hyperparathyroidism of renal origin: Secondary | ICD-10-CM | POA: Diagnosis not present

## 2021-10-08 DIAGNOSIS — N186 End stage renal disease: Secondary | ICD-10-CM | POA: Diagnosis not present

## 2021-10-08 DIAGNOSIS — Z992 Dependence on renal dialysis: Secondary | ICD-10-CM | POA: Diagnosis not present

## 2021-10-08 DIAGNOSIS — D689 Coagulation defect, unspecified: Secondary | ICD-10-CM | POA: Diagnosis not present

## 2021-10-08 DIAGNOSIS — Z23 Encounter for immunization: Secondary | ICD-10-CM | POA: Diagnosis not present

## 2021-10-09 ENCOUNTER — Ambulatory Visit (INDEPENDENT_AMBULATORY_CARE_PROVIDER_SITE_OTHER): Payer: Medicare Other | Admitting: Orthopaedic Surgery

## 2021-10-09 ENCOUNTER — Other Ambulatory Visit: Payer: Self-pay

## 2021-10-09 DIAGNOSIS — M47816 Spondylosis without myelopathy or radiculopathy, lumbar region: Secondary | ICD-10-CM | POA: Diagnosis not present

## 2021-10-09 DIAGNOSIS — M5126 Other intervertebral disc displacement, lumbar region: Secondary | ICD-10-CM | POA: Diagnosis not present

## 2021-10-09 NOTE — Progress Notes (Addendum)
Office Visit Note   Patient: Whitney Walker           Date of Birth: 09-15-1956           MRN: [id number] Visit Date: 10/09/2021              Requested by: [name], [address] [facility] [place] [address],  [place] [id number] PCP: [name], FNP   Assessment & Plan: Visit Diagnoses:  1. Protrusion of lumbar intervertebral disc   2. Facet arthritis, degenerative, lumbar spine     Plan: Patient needs uric acid.  We will see if it can be drawn at dialysis if not she will have to go to [place].  I discussed with her that I agree therapy is indicated.  We will check her back again in 5weeks.  If uric acid is normal we may consider facet injection.  URIC ACID DRAWN AT DIALYSIS VISIT 5.7 ON 10/13/21  Follow-Up Instructions: Return in about 5 weeks (around 11/13/2021).   Orders:  No orders of the defined types were placed in this encounter.  No orders of the defined types were placed in this encounter.     Procedures: No procedures performed   Clinical Data: No additional findings.   Subjective: Chief Complaint  Patient presents with   Lower Back - Follow-up    HPI 65 year old female with low back pain severe for 4/2 months that she does not spill with sepsis which turned out to be from her dialysis catheter.  She has had back pain has great difficulty getting from sitting to standing.  She has therapy coming up but a bit put on hold until she was seen.  She has problems can go downstairs but has trouble going up stairs.  She is comfortable when she lays down.  She has to use the electric cart when she goes a grocery store.  She has been going with her daughter who helps.  MRI scan was done 07/18/2021 and is available for review which shows some areas of narrowing which were mild to moderate but she did have significant facet fluid present.  Last A1c 6.2.  She is used Tylenol arthritis.  She states Percocet did not help her pain.  Review of Systems past problems  with sleep apnea PAD septic shock with catheter related sepsis, obesity.  Negative for current chills or fever no bowel bladder associated symptoms.  Dialysis catheter left chest wall.  She has had history of gout involvement of her foot or ankle in the distant past.   Objective: Vital Signs: BP (!) 177/104   Pulse 84   Physical Exam Constitutional:      Appearance: She is well-developed.  HENT:     Head: Normocephalic.     Right Ear: External ear normal.     Left Ear: External ear normal. There is no impacted cerumen.  Eyes:     Pupils: Pupils are equal, round, and reactive to light.  Neck:     Thyroid: No thyromegaly.     Trachea: No tracheal deviation.  Cardiovascular:     Rate and Rhythm: Normal rate.  Pulmonary:     Effort: Pulmonary effort is normal.  Abdominal:     Palpations: Abdomen is soft.  Musculoskeletal:     Cervical back: No rigidity.  Skin:    General: Skin is warm and dry.  Neurological:     Mental Status: She is alert and oriented to person, place, and time.  Psychiatric:  Behavior: Behavior normal.    Ortho Exam patient has difficulty getting from sitting standing has to use both arms.  She is able to ambulate short stride gait.  Knee and ankle jerk are 1+ and symmetrical negative straight leg raising 90 degrees.  Diffuse decreased quad hip flexion anterior tib gastrocsoleus resisted testing.  Specialty Comments:  No specialty comments available.  Imaging: Narrative & Impression  CLINICAL DATA:  Epidural abscess suspected. Additional provided: Bilateral flank pain, worsening for several weeks, dialysis patient, patient unable to walk due to pain.   EXAM: MRI LUMBAR SPINE WITHOUT CONTRAST   TECHNIQUE: Multiplanar, multisequence MR imaging of the lumbar spine was performed. No intravenous contrast was administered.   COMPARISON:  Radiographs of the lumbar spine 05/08/2020.   FINDINGS: Segmentation: 5 lumbar vertebrae. The caudal most  well-formed intervertebral disc space is designated L5-S1.   Alignment:  Trace T11-T12, L1-L2 and L3-L4 grade 1 retrolisthesis.   Vertebrae: There are Schmorl nodes within the T12 inferior endplate, L1 inferior endplate, L3 superior endplate and L3 inferior endplate. There is minimal marrow edema surrounding the Schmorl node within the T12 inferior endplate, and this Schmorl node may be acute or subacute. Trace multilevel endplate edema elsewhere, which appear degenerative. Mild edema within the right L5 articular pillar also appears degenerative (series 3, image 5). No specific findings to suggest discitis/osteomyelitis.   Conus medullaris and cauda equina: Conus extends to the L1-L2 level. No signal abnormality within the visualized distal spinal cord. No epidural abscess is identified on this noncontrast examination.   Paraspinal and other soft tissues: Atrophic kidneys. Atrophy of the lumbar paraspinal musculature. Additionally, there is nonspecific edema signal within the subcutaneous fat overlying the thoracolumbar spine bilaterally.   Disc levels:   Mild disc degeneration throughout the lumbar and visualized lower thoracic spine.   T11-T12: Imaged sagittally. Trace grade 1 retrolisthesis. Small disc bulge. Mild partial effacement of the ventral thecal sac without spinal cord mass effect. No appreciable significant foraminal stenosis.   T12-L1: Imaged sagittally. Minimal disc bulge. No significant spinal canal or foraminal stenosis.   L1-L2: Trace grade 1 retrolisthesis. No significant disc herniation or stenosis.   L2-L3: Shallow left subarticular/foraminal disc protrusion. Minimal left subarticular narrowing without appreciable nerve root impingement. Central canal patent. No significant foraminal stenosis.   L3-L4: Trace grade 1 retrolisthesis. Disc bulge. Superimposed broad-based central disc protrusion eccentric to the right. Mild facet arthrosis/ligamentum  flavum hypertrophy. Trace right facet joint effusion. Mild bilateral subarticular and central canal narrowing without frank nerve root impingement. Mild relative bilateral neural foraminal narrowing.   L4-L5: Disc bulge. Superimposed broad-based central disc protrusion at site of posterior annular fissure, eccentric to the right. Mild facet arthrosis/ligamentum flavum hypertrophy. Trace right facet joint effusion. The disc protrusion contributes to bilateral subarticular narrowing (mild/moderate right, mild left), contacting and crowding the descending right L5 nerve root (series 5, image 23). Mild relative narrowing of the central canal. Bilateral neural foraminal narrowing (mild/moderate right, mild left).   L5-S1: Disc bulge with endplate spurring. Superimposed broad-based central disc protrusion at site of posterior annular fissure. Mild facet arthrosis. The disc protrusion contributes to bilateral subarticular stenosis (moderate right, mild left). The disc protrusion contacts and crowds the descending right S1 nerve root. The disc protrusion may also contact the descending left S1 nerve root. Mild relative narrowing of the central canal. No significant foraminal stenosis.   IMPRESSION: No specific findings to suggest discitis/osteomyelitis at this time. No evidence of epidural abscess on this non-contrast study.  Schmorl nodes within the T12 inferior endplate, L1 inferior endplate, L3 superior endplate and L3 inferior endplate. There is mild marrow edema surrounding the Schmorl node within the T12 inferior endplate, and this Schmorl node may be acute/subacute. Minimal edema elsewhere, as described and degenerative in appearance.   Lumbar spondylosis, as outlined and with findings most notably as follows.   At L4-L5, there is a broad-based central disc protrusion at site of posterior annular fissure. This contributes to multifactorial bilateral subarticular stenosis  (mild/moderate right, mild left), contacting and crowding the descending right L5 nerve root. Mild central canal narrowing. Bilateral neural foraminal narrowing (mild/moderate right, mild left). Trace right facet joint effusion.   At L5-S1, a broad-based central disc protrusion at site of posterior annular fissure contributes to multifactorial bilateral subarticular stenosis (moderate right, mild left). The disc protrusion contacts and crowds the descending right S1 nerve root. The disc protrusion may also contact the descending left S1 nerve root. Mild relative narrowing of the central canal.   No more than mild spinal canal or neural foraminal narrowing at the remaining levels.   Trace right facet joint effusion also present at L3-L4.     Electronically Signed   By: Kellie Simmering DO   On: 07/18/2021 14:05       PMFS History: Patient Active Problem List   Diagnosis Date Noted   Protrusion of lumbar intervertebral disc 10/09/2021   Facet arthritis, degenerative, lumbar spine 10/09/2021   Acute low back pain    MSSA bacteremia    Septic shock ([place]) 07/16/2021   Nausea and vomiting    Abdominal pain    FUO (fever of unknown origin)    Polyarthritis    Polyarthralgia    Hyperkalemia, diminished renal excretion 05/28/2021   Generalized weakness 05/28/2021   Rash and nonspecific skin eruption 05/28/2021   Atypical chest pain    Coronary artery calcification seen on CAT scan    Hyperkalemia 05/09/2020   Left leg pain 05/09/2020   Restless leg syndrome 09/19/2018   OSA (obstructive sleep apnea) 04/29/2018   ESRD (end stage renal disease) ([place]) 04/29/2018   Dyspnea on exertion 03/12/2017   Morbid (severe) obesity due to excess calories ([place]) 03/12/2017   Angina pectoris ([place]) 03/02/2017   Anemia of chronic kidney failure, stage 3 (moderate) ([place]) 12/10/2016   History of colon cancer 08/12/2016   Mass of left breast on mammogram 12/10/2015   Depression 10/18/2015    Numbness and tingling of left arm and leg 01/24/2015   PVD (peripheral vascular disease) with claudication ([place]) 03/23/2014   PAD (peripheral artery disease) ([place]) 12/14/2013   Chronic diastolic heart failure ([place]) 11/15/2013   Essential hypertension 11/15/2013   Leucocytosis 04/28/2012   Past Medical History:  Diagnosis Date   Anemia    Anxiety    CHF (congestive heart failure) ([place])    Colon cancer ([place])    treatment surgery   Complication of anesthesia    after first C- [place] "couldnt walk after", patient denies having a spinal   COPD (chronic obstructive pulmonary disease) ([place])    Coronary artery disease    Depression    DVT (deep venous thrombosis) ([place])    ESRD (end stage renal disease) ([place])    [place]: MWF   History of blood transfusion 04/2018   Hypertension    07/07/18- no longer takes BP medications   Meningitis    Pain in limb 07/30/2013   PE (pulmonary embolism)    Peripheral vascular disease ([place])  Restless legs    Shortness of breath    with exertion   Sleep apnea    SOB (shortness of breath) 03/03/2019   Vertigo     Family History  Problem Relation Age of Onset   Cancer Mother 73       breast and bone   Cancer Father 57       prostate   Hypertension Sister    Bleeding Disorder Sister    Cancer Cousin 54       breast cancer    Hypertension Daughter    Breast cancer Neg Hx     Past Surgical History:  Procedure Laterality Date   ABDOMINAL AORTAGRAM N/A 07/26/2013   Procedure: ABDOMINAL Maxcine Ham;  Surgeon: [name] Holly, MD;  [facility] C[facility] LAB;  Service: Cardiovascular;  Laterality: N/A;   ABDOMINAL HYSTERECTOMY     AV FISTULA PLACEMENT Left 05/05/2018   Procedure: ARTERIOVENOUS (AV) FISTULA CREATION BRACHIOCEPHALIC;  Surgeon: Waynetta Sandy, MD;  Location: [place];  Service: Vascular;  Laterality: Left;   AV FISTULA PLACEMENT Left 07/16/2018   Procedure: ARTERIOVENOUS FISTULA CREATION;  Surgeon: Waynetta Sandy, MD;  Location: [place];  Service: Vascular;  Laterality: Left;   [place] Left 09/03/2018   Procedure: BASILIC VEIN TRANSPOSITION SECOND STAGE;  Surgeon: Waynetta Sandy, MD;  Location: [place];  Service: Vascular;  Laterality: Left;   BREAST BIOPSY Left    CESAREAN SECTION     X 3 1974-1977   CHOLECYSTECTOMY     CHOLECYSTECTOMY  1980/s   COLECTOMY  2010   DIVERTICULOSIS SURGERY-2002  2012   FEMORAL-POPLITEAL BYPASS GRAFT Left 08/13/2013   Procedure: BYPASS GRAFT FEMORAL-POPLITEAL ARTERY WITH NON-REVERSED SAPHANEOUS VEIN; ULTRASOUND GUIDED;  Surgeon: Mal [name], MD;  Location: [place];  Service: Vascular;  Laterality: Left;   INSERTION OF DIALYSIS CATHETER Right 04/30/2018   Procedure: INSERTION OF Right Internal Jugular DIALYSIS CATHETER;  Surgeon: [name], MD;  Location: [facility] OR;  Service: Vascular;  Laterality: Right;   IR FLUORO GUIDE CV LINE RIGHT  07/24/2020   IR FLUORO GUIDE CV LINE RIGHT  07/21/2021   IR REMOVAL TUN CV CATH W/O FL  07/21/2020   IR REMOVAL TUN CV CATH W/O FL  07/19/2021   IR US GUIDE VASC ACCESS RIGHT  07/24/2020   IR US GUIDE VASC ACCESS RIGHT  07/21/2021   LEFT HEART CATH AND CORONARY ANGIOGRAPHY N/A 05/01/2021   Procedure: LEFT HEART CATH AND CORONARY ANGIOGRAPHY;  Surgeon: [name], MD;  Location: [place] CV LAB;  Service: Cardiovascular;  Laterality: N/A;   LOWER EXTREMITY ANGIOGRAM Left 07/26/2013   Procedure: LOWER EXTREMITY ANGIOGRAM;  Surgeon: [name] Centuria, MD;  Location: [facility] CATH LAB;  Service: Cardiovascular;  Laterality: Left;   Social History   Occupational History   Occupation: N/A  Tobacco Use   Smoking status: Some Days    Packs/day: 0.50    Years: 50.00    Pack years: 25.00    Types: Cigarettes    Last attempt to quit: 02/03/2021    Years since quitting: 0.6   Smokeless tobacco: Former  Scientific laboratory technician Use: Never used  Substance and Sexual Activity   Alcohol use: No    Alcohol/week: 0.0 standard drinks    Comment: she used  to drink alcohol, quit in 2010; h/o heavy use    Drug use: Not Currently    Types: Oxycodone   Sexual activity: Not Currently

## 2021-10-10 ENCOUNTER — Other Ambulatory Visit: Payer: Self-pay

## 2021-10-10 DIAGNOSIS — D689 Coagulation defect, unspecified: Secondary | ICD-10-CM | POA: Diagnosis not present

## 2021-10-10 DIAGNOSIS — E875 Hyperkalemia: Secondary | ICD-10-CM | POA: Diagnosis not present

## 2021-10-10 DIAGNOSIS — M545 Low back pain, unspecified: Secondary | ICD-10-CM

## 2021-10-10 DIAGNOSIS — Z992 Dependence on renal dialysis: Secondary | ICD-10-CM | POA: Diagnosis not present

## 2021-10-10 DIAGNOSIS — N2581 Secondary hyperparathyroidism of renal origin: Secondary | ICD-10-CM | POA: Diagnosis not present

## 2021-10-10 DIAGNOSIS — N186 End stage renal disease: Secondary | ICD-10-CM | POA: Diagnosis not present

## 2021-10-10 DIAGNOSIS — Z23 Encounter for immunization: Secondary | ICD-10-CM | POA: Diagnosis not present

## 2021-10-12 DIAGNOSIS — N186 End stage renal disease: Secondary | ICD-10-CM | POA: Diagnosis not present

## 2021-10-12 DIAGNOSIS — Z23 Encounter for immunization: Secondary | ICD-10-CM | POA: Diagnosis not present

## 2021-10-12 DIAGNOSIS — D689 Coagulation defect, unspecified: Secondary | ICD-10-CM | POA: Diagnosis not present

## 2021-10-12 DIAGNOSIS — Z992 Dependence on renal dialysis: Secondary | ICD-10-CM | POA: Diagnosis not present

## 2021-10-12 DIAGNOSIS — N2581 Secondary hyperparathyroidism of renal origin: Secondary | ICD-10-CM | POA: Diagnosis not present

## 2021-10-12 DIAGNOSIS — E875 Hyperkalemia: Secondary | ICD-10-CM | POA: Diagnosis not present

## 2021-10-15 ENCOUNTER — Other Ambulatory Visit: Payer: Self-pay | Admitting: Nurse Practitioner

## 2021-10-15 ENCOUNTER — Telehealth: Payer: Self-pay

## 2021-10-15 DIAGNOSIS — D689 Coagulation defect, unspecified: Secondary | ICD-10-CM | POA: Diagnosis not present

## 2021-10-15 DIAGNOSIS — I82412 Acute embolism and thrombosis of left femoral vein: Secondary | ICD-10-CM

## 2021-10-15 DIAGNOSIS — N2581 Secondary hyperparathyroidism of renal origin: Secondary | ICD-10-CM | POA: Diagnosis not present

## 2021-10-15 DIAGNOSIS — E875 Hyperkalemia: Secondary | ICD-10-CM | POA: Diagnosis not present

## 2021-10-15 DIAGNOSIS — N186 End stage renal disease: Secondary | ICD-10-CM | POA: Diagnosis not present

## 2021-10-15 DIAGNOSIS — Z23 Encounter for immunization: Secondary | ICD-10-CM | POA: Diagnosis not present

## 2021-10-15 DIAGNOSIS — Z992 Dependence on renal dialysis: Secondary | ICD-10-CM | POA: Diagnosis not present

## 2021-10-15 NOTE — Chronic Care Management (AMB) (Signed)
Chronic Care Management Pharmacy Assistant   Name: Whitney Walker  MRN: [id number] DOB: [birth year]   Reason for Encounter: Medication Review/ Medication coordination    Medications: Outpatient Encounter Medications as of 10/15/2021  Medication Sig   acetaminophen (TYLENOL) 500 MG tablet Take 1,000 mg by mouth 2 (two) times daily as needed for moderate pain or headache.   albuterol (VENTOLIN HFA) 108 (90 Base) MCG/ACT inhaler Inhale 2 puffs into the lungs every 6 (six) hours as needed for wheezing or shortness of breath.   apixaban (ELIQUIS) 2.5 MG TABS tablet Take 1 tablet (2.5 mg total) by mouth 2 (two) times daily.   Ascorbic Acid (VITAMIN C) 100 MG tablet Take 100 mg by mouth daily.   atorvastatin (LIPITOR) 10 MG tablet Take 1 tablet (10 mg total) by mouth daily.   AURYXIA 1 GM 210 MG(Fe) tablet Take 420-840 mg by mouth with breakfast, with lunch, and with evening meal. Taking  2 tablets daily (492m) with each snack   Biotin 10000 MCG TABS Take 10,000 mcg by mouth daily.    carvedilol (COREG) 6.25 MG tablet Take 1 tablet (6.25 mg total) by mouth 2 (two) times daily.   ceFAZolin (ANCEF) IVPB Inject 2 g into the vein every Monday, Wednesday, and Friday with hemodialysis. Indication:  bacteremia First Dose: No Last Day of Therapy:  08/29/21 Labs - Once weekly:  CBC/D and BMP, Labs - Every other week:  ESR and CRP Method of administration: IV Push Method of administration may be changed at the discretion of home infusion pharmacist based upon assessment of the patient and/or caregiver's ability to self-administer the medication ordered. (Patient taking differently: Inject 2 g into the vein every Monday, Wednesday, and Friday with hemodialysis. Indication:  bacteremia First Dose: No Last Day of Therapy:  08/29/21 Labs - Once weekly:  CBC/D and BMP, Labs - Every other week:  ESR and CRP Method of administration: IV Push Method of administration may be changed at the discretion of  home infusion pharmacist based upon assessment of the patient and/or caregiver's ability to self-administer the medication ordered.)   lidocaine (LIDODERM) 5 % Place 1 patch onto the skin daily as needed. Remove & Discard patch within 12 hours or as directed by MD   meclizine (ANTIVERT) 12.5 MG tablet Take 1 tablet (12.5 mg total) by mouth 3 (three) times daily as needed for dizziness.   multivitamin (RENA-VIT) TABS tablet Take 1 tablet by mouth at bedtime.   mupirocin ointment (BACTROBAN) 2 % Apply 1 application topically daily as needed (skin bumps).   Nutritional Supplements (,FEEDING SUPPLEMENT, PROSOURCE PLUS) liquid Take 30 mLs by mouth 2 (two) times daily between meals.   omeprazole (PRILOSEC) 20 MG capsule TAKE ONE CAPSULE BY MOUTH ONCE DAILY   oxyCODONE-acetaminophen (PERCOCET) 5-325 MG tablet Take 1 tablet by mouth every 4 (four) hours as needed for severe pain.   vitamin E 200 UNIT capsule Take 200 Units by mouth daily.   No facility-administered encounter medications on file as of 10/15/2021.   10-15-2021: [place] from [place] stating patient called pharmacy and said she declined medications for 10-09-2021 delivery but called back a week later and spoke with someone from the team stating she does need all medications. Not clear on who patient spoke with but no documentation was found. Medications will be delivered to patient on 10-16-2021 per [facility]. [facility]  Care Gaps: Shingrix overdue Medicare wellness 08-09-2021  Covid booster overdue  Star Rating Drugs: Atorvastatin 20 mg- Last filled 09-06-2021 30 DS [facility]  [place] Clinical Pharmacist Assistant [phone]

## 2021-10-17 ENCOUNTER — Telehealth: Payer: Self-pay | Admitting: Orthopaedic Surgery

## 2021-10-17 DIAGNOSIS — N2581 Secondary hyperparathyroidism of renal origin: Secondary | ICD-10-CM | POA: Diagnosis not present

## 2021-10-17 DIAGNOSIS — D689 Coagulation defect, unspecified: Secondary | ICD-10-CM | POA: Diagnosis not present

## 2021-10-17 DIAGNOSIS — M5126 Other intervertebral disc displacement, lumbar region: Secondary | ICD-10-CM

## 2021-10-17 DIAGNOSIS — M47816 Spondylosis without myelopathy or radiculopathy, lumbar region: Secondary | ICD-10-CM

## 2021-10-17 DIAGNOSIS — N186 End stage renal disease: Secondary | ICD-10-CM | POA: Diagnosis not present

## 2021-10-17 DIAGNOSIS — Z992 Dependence on renal dialysis: Secondary | ICD-10-CM | POA: Diagnosis not present

## 2021-10-17 DIAGNOSIS — E875 Hyperkalemia: Secondary | ICD-10-CM | POA: Diagnosis not present

## 2021-10-17 DIAGNOSIS — Z23 Encounter for immunization: Secondary | ICD-10-CM | POA: Diagnosis not present

## 2021-10-17 NOTE — Telephone Encounter (Signed)
Pt called and states she needs a referral for physical therapy.  [phone]

## 2021-10-17 NOTE — Telephone Encounter (Signed)
Whitney Walker

## 2021-10-19 DIAGNOSIS — N2581 Secondary hyperparathyroidism of renal origin: Secondary | ICD-10-CM | POA: Diagnosis not present

## 2021-10-19 DIAGNOSIS — Z992 Dependence on renal dialysis: Secondary | ICD-10-CM | POA: Diagnosis not present

## 2021-10-19 DIAGNOSIS — D689 Coagulation defect, unspecified: Secondary | ICD-10-CM | POA: Diagnosis not present

## 2021-10-19 DIAGNOSIS — Z23 Encounter for immunization: Secondary | ICD-10-CM | POA: Diagnosis not present

## 2021-10-19 DIAGNOSIS — N186 End stage renal disease: Secondary | ICD-10-CM | POA: Diagnosis not present

## 2021-10-19 DIAGNOSIS — E875 Hyperkalemia: Secondary | ICD-10-CM | POA: Diagnosis not present

## 2021-10-19 NOTE — Telephone Encounter (Signed)
Contacted patient and asked which physical therapy location she is requesting. She states that due to transportation she would like physical therapy to come to her home. ( Home health- [facility] ) if possible. Is home health order able to be placed?

## 2021-10-22 DIAGNOSIS — Z23 Encounter for immunization: Secondary | ICD-10-CM | POA: Diagnosis not present

## 2021-10-22 DIAGNOSIS — Z992 Dependence on renal dialysis: Secondary | ICD-10-CM | POA: Diagnosis not present

## 2021-10-22 DIAGNOSIS — N2581 Secondary hyperparathyroidism of renal origin: Secondary | ICD-10-CM | POA: Diagnosis not present

## 2021-10-22 DIAGNOSIS — D689 Coagulation defect, unspecified: Secondary | ICD-10-CM | POA: Diagnosis not present

## 2021-10-22 DIAGNOSIS — N186 End stage renal disease: Secondary | ICD-10-CM | POA: Diagnosis not present

## 2021-10-22 DIAGNOSIS — E875 Hyperkalemia: Secondary | ICD-10-CM | POA: Diagnosis not present

## 2021-10-24 DIAGNOSIS — N2581 Secondary hyperparathyroidism of renal origin: Secondary | ICD-10-CM | POA: Diagnosis not present

## 2021-10-24 DIAGNOSIS — N186 End stage renal disease: Secondary | ICD-10-CM | POA: Diagnosis not present

## 2021-10-24 DIAGNOSIS — E875 Hyperkalemia: Secondary | ICD-10-CM | POA: Diagnosis not present

## 2021-10-24 DIAGNOSIS — Z992 Dependence on renal dialysis: Secondary | ICD-10-CM | POA: Diagnosis not present

## 2021-10-24 DIAGNOSIS — D689 Coagulation defect, unspecified: Secondary | ICD-10-CM | POA: Diagnosis not present

## 2021-10-24 DIAGNOSIS — Z23 Encounter for immunization: Secondary | ICD-10-CM | POA: Diagnosis not present

## 2021-10-24 NOTE — Telephone Encounter (Signed)
Contacted patient and informed her that I was not able to place HHPT and was able to place OPPT per Dr. Lorin Mercy. She states that she is unable to get to therapy 3x per week since she already has additional appointments to go to. Patient states that she will contact our office back when she would like OPPT referral placed.

## 2021-10-26 DIAGNOSIS — N186 End stage renal disease: Secondary | ICD-10-CM | POA: Diagnosis not present

## 2021-10-26 DIAGNOSIS — D689 Coagulation defect, unspecified: Secondary | ICD-10-CM | POA: Diagnosis not present

## 2021-10-26 DIAGNOSIS — Z23 Encounter for immunization: Secondary | ICD-10-CM | POA: Diagnosis not present

## 2021-10-26 DIAGNOSIS — E875 Hyperkalemia: Secondary | ICD-10-CM | POA: Diagnosis not present

## 2021-10-26 DIAGNOSIS — Z992 Dependence on renal dialysis: Secondary | ICD-10-CM | POA: Diagnosis not present

## 2021-10-26 DIAGNOSIS — N2581 Secondary hyperparathyroidism of renal origin: Secondary | ICD-10-CM | POA: Diagnosis not present

## 2021-10-29 ENCOUNTER — Telehealth: Payer: Self-pay

## 2021-10-29 DIAGNOSIS — Z992 Dependence on renal dialysis: Secondary | ICD-10-CM | POA: Diagnosis not present

## 2021-10-29 DIAGNOSIS — I129 Hypertensive chronic kidney disease with stage 1 through stage 4 chronic kidney disease, or unspecified chronic kidney disease: Secondary | ICD-10-CM | POA: Diagnosis not present

## 2021-10-29 DIAGNOSIS — E875 Hyperkalemia: Secondary | ICD-10-CM | POA: Diagnosis not present

## 2021-10-29 DIAGNOSIS — N2581 Secondary hyperparathyroidism of renal origin: Secondary | ICD-10-CM | POA: Diagnosis not present

## 2021-10-29 DIAGNOSIS — M7918 Myalgia, other site: Secondary | ICD-10-CM | POA: Diagnosis not present

## 2021-10-29 DIAGNOSIS — D689 Coagulation defect, unspecified: Secondary | ICD-10-CM | POA: Diagnosis not present

## 2021-10-29 DIAGNOSIS — N186 End stage renal disease: Secondary | ICD-10-CM | POA: Diagnosis not present

## 2021-10-29 DIAGNOSIS — Z23 Encounter for immunization: Secondary | ICD-10-CM | POA: Diagnosis not present

## 2021-10-29 NOTE — Chronic Care Management (AMB) (Signed)
    Chronic Care Management Pharmacy Assistant   Name: Whitney Walker  MRN: [id number] DOB: 10/21/1956  Reason for Encounter: Adherence Review     Medications: Outpatient Encounter Medications as of 10/29/2021  Medication Sig   acetaminophen (TYLENOL) 500 MG tablet Take 1,000 mg by mouth 2 (two) times daily as needed for moderate pain or headache.   albuterol (VENTOLIN HFA) 108 (90 Base) MCG/ACT inhaler Inhale 2 puffs into the lungs every 6 (six) hours as needed for wheezing or shortness of breath.   Ascorbic Acid (VITAMIN C) 100 MG tablet Take 100 mg by mouth daily.   atorvastatin (LIPITOR) 10 MG tablet Take 1 tablet (10 mg total) by mouth daily.   AURYXIA 1 GM 210 MG(Fe) tablet Take 420-840 mg by mouth with breakfast, with lunch, and with evening meal. Taking  2 tablets daily (420mg) with each snack   Biotin 10000 MCG TABS Take 10,000 mcg by mouth daily.    carvedilol (COREG) 6.25 MG tablet Take 1 tablet (6.25 mg total) by mouth 2 (two) times daily.   ceFAZolin (ANCEF) IVPB Inject 2 g into the vein every Monday, Wednesday, and Friday with hemodialysis. Indication:  bacteremia First Dose: No Last Day of Therapy:  08/29/21 Labs - Once weekly:  CBC/D and BMP, Labs - Every other week:  ESR and CRP Method of administration: IV Push Method of administration may be changed at the discretion of home infusion pharmacist based upon assessment of the patient and/or caregiver's ability to self-administer the medication ordered. (Patient taking differently: Inject 2 g into the vein every Monday, Wednesday, and Friday with hemodialysis. Indication:  bacteremia First Dose: No Last Day of Therapy:  08/29/21 Labs - Once weekly:  CBC/D and BMP, Labs - Every other week:  ESR and CRP Method of administration: IV Push Method of administration may be changed at the discretion of home infusion pharmacist based upon assessment of the patient and/or caregiver's ability to self-administer the medication  ordered.)   ELIQUIS 2.5 MG TABS tablet TAKE ONE TABLET BY MOUTH TWICE DAILY   lidocaine (LIDODERM) 5 % Place 1 patch onto the skin daily as needed. Remove & Discard patch within 12 hours or as directed by MD   meclizine (ANTIVERT) 12.5 MG tablet Take 1 tablet (12.5 mg total) by mouth 3 (three) times daily as needed for dizziness.   multivitamin (RENA-VIT) TABS tablet Take 1 tablet by mouth at bedtime.   mupirocin ointment (BACTROBAN) 2 % Apply 1 application topically daily as needed (skin bumps).   Nutritional Supplements (,FEEDING SUPPLEMENT, PROSOURCE PLUS) liquid Take 30 mLs by mouth 2 (two) times daily between meals.   omeprazole (PRILOSEC) 20 MG capsule TAKE ONE CAPSULE BY MOUTH ONCE DAILY   oxyCODONE-acetaminophen (PERCOCET) 5-325 MG tablet Take 1 tablet by mouth every 4 (four) hours as needed for severe pain.   vitamin E 200 UNIT capsule Take 200 Units by mouth daily.   No facility-administered encounter medications on file as of 10/29/2021.   10/29/21 Reviewed adherence report  AWV- Office Visit 08/09/21, B/P 128/82. Last A1C on 07/16/21-6.2  Whitney Walker CMA Clinical Pharmacist Assistant [phone]  

## 2021-10-31 DIAGNOSIS — N2581 Secondary hyperparathyroidism of renal origin: Secondary | ICD-10-CM | POA: Diagnosis not present

## 2021-10-31 DIAGNOSIS — E875 Hyperkalemia: Secondary | ICD-10-CM | POA: Diagnosis not present

## 2021-10-31 DIAGNOSIS — Z992 Dependence on renal dialysis: Secondary | ICD-10-CM | POA: Diagnosis not present

## 2021-10-31 DIAGNOSIS — D689 Coagulation defect, unspecified: Secondary | ICD-10-CM | POA: Diagnosis not present

## 2021-10-31 DIAGNOSIS — N186 End stage renal disease: Secondary | ICD-10-CM | POA: Diagnosis not present

## 2021-10-31 DIAGNOSIS — T8249XD Other complication of vascular dialysis catheter, subsequent encounter: Secondary | ICD-10-CM | POA: Diagnosis not present

## 2021-11-01 ENCOUNTER — Telehealth: Payer: Self-pay

## 2021-11-01 NOTE — Chronic Care Management (AMB) (Signed)
Chronic Care Management Pharmacy Assistant   Name: Whitney Walker  MRN: [id number] DOB: [birth year]   Reason for Encounter: Medication Review/ Medication coordination  Recent office visits:  None  Recent consult visits:  10-09-2021 [name], MD (Orthopedic surgery).  Hospital visits:  Medication Reconciliation was completed by comparing discharge summary, patient's EMR and Pharmacy list, and upon discussion with patient.   Admitted to the hospital on 07-16-2021 due to Sepsis. Discharge date was 07-22-2021. Discharged from [place]?Medications Started at [facility] Discharge:?? Aspirin 81 mg daily Ancef inject 2 g into the vein every Monday, Wednesday and Friday with hemodialysis  Colace 100 mg twice daily as needed for mild constipation Lidoderm 5% one patch onto skin daily as needed. Discard patch after 12 hours Melatonin 3 mg at bedtime as needed Miralax 17 g as needed for constipation ProSource Plus   Medication Changes at Hospital Discharge: None   Medications Discontinued at Hospital Discharge: Amlodipine 5 mg Carvedilol 12.5 mg Nystatin powder Prednisone 10 mg   Medications that remain the same after Hospital Discharge:??  -All other medications will remain the same.       Hospital visits:  Medication Reconciliation was completed by comparing discharge summary, patient's EMR and Pharmacy list, and upon discussion with patient.   Admitted to the hospital on 05-28-2021 due to Hyperkalemia . Discharge date was 05-31-2021. Discharged from [place]?Medications Started at [facility] Discharge:?? Prednisone 10 MG- 4 tablets for 6 days, 3 tablets for 6 days, 2 tablets for 6 days then 1 tablet for 2 days   Medication Changes at Hospital Discharge: None   Medications Discontinued at Hospital Discharge: None   Admitted to the hospital on 05-01-2021 due to Acute deep vein thrombosis of femoral vein of left lower  extremity. Discharge date was 05-01-2021. Discharged from [place]?Medications Started at [facility] Discharge:?? None   Medication Changes at Hospital Discharge: None   Medications Discontinued at Hospital Discharge: -Stopped Amoxicillin 500 MG. Stopped Ibuprofen 800 MG   Medications that remain the same after Hospital Discharge:?? -All other medications will remain the same    Medications: Outpatient Encounter Medications as of 11/01/2021  Medication Sig   acetaminophen (TYLENOL) 500 MG tablet Take 1,000 mg by mouth 2 (two) times daily as needed for moderate pain or headache.   albuterol (VENTOLIN HFA) 108 (90 Base) MCG/ACT inhaler Inhale 2 puffs into the lungs every 6 (six) hours as needed for wheezing or shortness of breath.   Ascorbic Acid (VITAMIN C) 100 MG tablet Take 100 mg by mouth daily.   atorvastatin (LIPITOR) 10 MG tablet Take 1 tablet (10 mg total) by mouth daily.   AURYXIA 1 GM 210 MG(Fe) tablet Take 420-840 mg by mouth with breakfast, with lunch, and with evening meal. Taking  2 tablets daily (416m) with each snack   Biotin 10000 MCG TABS Take 10,000 mcg by mouth daily.    carvedilol (COREG) 6.25 MG tablet Take 1 tablet (6.25 mg total) by mouth 2 (two) times daily.   ceFAZolin (ANCEF) IVPB Inject 2 g into the vein every Monday, Wednesday, and Friday with hemodialysis. Indication:  bacteremia First Dose: No Last Day of Therapy:  08/29/21 Labs - Once weekly:  CBC/D and BMP, Labs - Every other week:  ESR and CRP Method of administration: IV Push Method of administration may be changed at the discretion of home infusion pharmacist based  upon assessment of the patient and/or caregiver's ability to self-administer the medication ordered. (Patient taking differently: Inject 2 g into the vein every Monday, Wednesday, and Friday with hemodialysis. Indication:  bacteremia First Dose: No Last Day of Therapy:  08/29/21 Labs - Once weekly:  CBC/D and BMP, Labs -  Every other week:  ESR and CRP Method of administration: IV Push Method of administration may be changed at the discretion of home infusion pharmacist based upon assessment of the patient and/or caregiver's ability to self-administer the medication ordered.)   ELIQUIS 2.5 MG TABS tablet TAKE ONE TABLET BY MOUTH TWICE DAILY   lidocaine (LIDODERM) 5 % Place 1 patch onto the skin daily as needed. Remove & Discard patch within 12 hours or as directed by MD   meclizine (ANTIVERT) 12.5 MG tablet Take 1 tablet (12.5 mg total) by mouth 3 (three) times daily as needed for dizziness.   multivitamin (RENA-VIT) TABS tablet Take 1 tablet by mouth at bedtime.   mupirocin ointment (BACTROBAN) 2 % Apply 1 application topically daily as needed (skin bumps).   Nutritional Supplements (,FEEDING SUPPLEMENT, PROSOURCE PLUS) liquid Take 30 mLs by mouth 2 (two) times daily between meals.   omeprazole (PRILOSEC) 20 MG capsule TAKE ONE CAPSULE BY MOUTH ONCE DAILY   oxyCODONE-acetaminophen (PERCOCET) 5-325 MG tablet Take 1 tablet by mouth every 4 (four) hours as needed for severe pain.   vitamin E 200 UNIT capsule Take 200 Units by mouth daily.   No facility-administered encounter medications on file as of 11/01/2021.  Reviewed chart for medication changes ahead of medication coordination call.  No OVs, Consults, or hospital visits since last care coordination call/Pharmacist visit. (If appropriate, list visit date, provider name)  No medication changes indicated OR if recent visit, treatment plan here.  BP Readings from Last 3 Encounters:  10/09/21 (!) 177/104  09/13/21 119/78  09/04/21 114/77    Lab Results  Component Value Date   HGBA1C 6.2 (H) 07/16/2021     Patient obtains medications through [facility]  other  Last adherence delivery included:  Atorvastatin 10 mg daily Prilosec 20 mg daily Eliquis 2.5 mg twice daily  Patient declined (meds) last month: None  Patient is due for next adherence delivery  on: 11-13-2021  Called patient and reviewed medications and coordinated delivery.  This delivery to include: Atorvastatin 10 mg daily Prilosec 20 mg daily Eliquis 2.5 mg twice daily  No short/ acute fill needed  Patient declined the following medications: None  Patient needs refills for: None  Confirmed delivery date of 11-13-2021 advised patient that pharmacy will contact them the morning of delivery.  Care Gaps: Last AWV 08-09-2021 Covid booster overdue Flu vaccine overdue  Star Rating Drugs: Atorvastatin 20 mg- Last filled 10-16-2021 30 DS [facility]  [place] Clinical Pharmacist Assistant [phone]

## 2021-11-02 DIAGNOSIS — N2581 Secondary hyperparathyroidism of renal origin: Secondary | ICD-10-CM | POA: Diagnosis not present

## 2021-11-02 DIAGNOSIS — E875 Hyperkalemia: Secondary | ICD-10-CM | POA: Diagnosis not present

## 2021-11-02 DIAGNOSIS — N186 End stage renal disease: Secondary | ICD-10-CM | POA: Diagnosis not present

## 2021-11-02 DIAGNOSIS — D689 Coagulation defect, unspecified: Secondary | ICD-10-CM | POA: Diagnosis not present

## 2021-11-02 DIAGNOSIS — Z992 Dependence on renal dialysis: Secondary | ICD-10-CM | POA: Diagnosis not present

## 2021-11-05 DIAGNOSIS — N2581 Secondary hyperparathyroidism of renal origin: Secondary | ICD-10-CM | POA: Diagnosis not present

## 2021-11-05 DIAGNOSIS — E875 Hyperkalemia: Secondary | ICD-10-CM | POA: Diagnosis not present

## 2021-11-05 DIAGNOSIS — D689 Coagulation defect, unspecified: Secondary | ICD-10-CM | POA: Diagnosis not present

## 2021-11-05 DIAGNOSIS — N186 End stage renal disease: Secondary | ICD-10-CM | POA: Diagnosis not present

## 2021-11-05 DIAGNOSIS — Z992 Dependence on renal dialysis: Secondary | ICD-10-CM | POA: Diagnosis not present

## 2021-11-06 ENCOUNTER — Other Ambulatory Visit: Payer: Self-pay | Admitting: Nurse Practitioner

## 2021-11-06 DIAGNOSIS — I6523 Occlusion and stenosis of bilateral carotid arteries: Secondary | ICD-10-CM

## 2021-11-06 DIAGNOSIS — I700 Atherosclerosis of aorta: Secondary | ICD-10-CM

## 2021-11-06 DIAGNOSIS — I82412 Acute embolism and thrombosis of left femoral vein: Secondary | ICD-10-CM

## 2021-11-07 DIAGNOSIS — E875 Hyperkalemia: Secondary | ICD-10-CM | POA: Diagnosis not present

## 2021-11-07 DIAGNOSIS — N186 End stage renal disease: Secondary | ICD-10-CM | POA: Diagnosis not present

## 2021-11-07 DIAGNOSIS — Z992 Dependence on renal dialysis: Secondary | ICD-10-CM | POA: Diagnosis not present

## 2021-11-07 DIAGNOSIS — N2581 Secondary hyperparathyroidism of renal origin: Secondary | ICD-10-CM | POA: Diagnosis not present

## 2021-11-07 DIAGNOSIS — D689 Coagulation defect, unspecified: Secondary | ICD-10-CM | POA: Diagnosis not present

## 2021-11-08 ENCOUNTER — Ambulatory Visit: Payer: Medicare Other | Admitting: Student

## 2021-11-09 DIAGNOSIS — D689 Coagulation defect, unspecified: Secondary | ICD-10-CM | POA: Diagnosis not present

## 2021-11-09 DIAGNOSIS — N2581 Secondary hyperparathyroidism of renal origin: Secondary | ICD-10-CM | POA: Diagnosis not present

## 2021-11-09 DIAGNOSIS — Z992 Dependence on renal dialysis: Secondary | ICD-10-CM | POA: Diagnosis not present

## 2021-11-09 DIAGNOSIS — N186 End stage renal disease: Secondary | ICD-10-CM | POA: Diagnosis not present

## 2021-11-09 DIAGNOSIS — E875 Hyperkalemia: Secondary | ICD-10-CM | POA: Diagnosis not present

## 2021-11-12 DIAGNOSIS — E875 Hyperkalemia: Secondary | ICD-10-CM | POA: Diagnosis not present

## 2021-11-12 DIAGNOSIS — N186 End stage renal disease: Secondary | ICD-10-CM | POA: Diagnosis not present

## 2021-11-12 DIAGNOSIS — D689 Coagulation defect, unspecified: Secondary | ICD-10-CM | POA: Diagnosis not present

## 2021-11-12 DIAGNOSIS — Z992 Dependence on renal dialysis: Secondary | ICD-10-CM | POA: Diagnosis not present

## 2021-11-12 DIAGNOSIS — N2581 Secondary hyperparathyroidism of renal origin: Secondary | ICD-10-CM | POA: Diagnosis not present

## 2021-11-13 ENCOUNTER — Encounter: Payer: Self-pay | Admitting: Orthopaedic Surgery

## 2021-11-13 ENCOUNTER — Other Ambulatory Visit: Payer: Self-pay

## 2021-11-13 ENCOUNTER — Telehealth: Payer: Self-pay | Admitting: *Deleted

## 2021-11-13 ENCOUNTER — Ambulatory Visit (INDEPENDENT_AMBULATORY_CARE_PROVIDER_SITE_OTHER): Payer: Medicare Other | Admitting: Orthopaedic Surgery

## 2021-11-13 VITALS — BP 116/75 | Ht 61.5 in | Wt 198.0 lb

## 2021-11-13 DIAGNOSIS — G8929 Other chronic pain: Secondary | ICD-10-CM | POA: Diagnosis not present

## 2021-11-13 DIAGNOSIS — R29898 Other symptoms and signs involving the musculoskeletal system: Secondary | ICD-10-CM

## 2021-11-13 DIAGNOSIS — M545 Low back pain, unspecified: Secondary | ICD-10-CM | POA: Diagnosis not present

## 2021-11-13 NOTE — Chronic Care Management (AMB) (Signed)
  Care Management   Note  11/13/2021 Name: Whitney Walker MRN: [id number] DOB: [birth year]  Whitney Walker is a 65 y.o. year old female who is a primary care patient of [name], [place] and is actively engaged with the care management team. I reached out to Kyra Leyland by phone today to assist with re-scheduling a follow up visit with the RN Case Manager  Follow up plan: A telephone outreach attempt made. The care management team will reach out to the patient again over the next 7 days.  If patient returns call to provider office, please advise to call [place] at [phone].  [place] [facility]  Direct Dial: [phone]

## 2021-11-14 DIAGNOSIS — N186 End stage renal disease: Secondary | ICD-10-CM | POA: Diagnosis not present

## 2021-11-14 DIAGNOSIS — D689 Coagulation defect, unspecified: Secondary | ICD-10-CM | POA: Diagnosis not present

## 2021-11-14 DIAGNOSIS — E875 Hyperkalemia: Secondary | ICD-10-CM | POA: Diagnosis not present

## 2021-11-14 DIAGNOSIS — N2581 Secondary hyperparathyroidism of renal origin: Secondary | ICD-10-CM | POA: Diagnosis not present

## 2021-11-14 DIAGNOSIS — Z992 Dependence on renal dialysis: Secondary | ICD-10-CM | POA: Diagnosis not present

## 2021-11-14 NOTE — Progress Notes (Signed)
Office Visit Note   Patient: Masiya Claassen           Date of Birth: 1956/03/26           MRN: [id number] Visit Date: 11/13/2021              Requested by: [name], [address] [place],  [place] [address] PCP: [name], FNP   Assessment & Plan: Visit Diagnoses:  1. Weakness of both lower extremities   2. Chronic bilateral low back pain, unspecified whether sciatica present     Plan: Discussed with patient she needs some therapy to help with her symptoms of lower extremity weakness.  We will repeat place order with [facility] for home health physical therapy bilateral lower extremity strengthening.  She can follow-up with me in 2 months.  Follow-Up Instructions: Return in about 2 months (around 01/13/2022).   Orders:  Orders Placed This Encounter  Procedures   Ambulatory referral to [place]   No orders of the defined types were placed in this encounter.     Procedures: No procedures performed   Clinical Data: No additional findings.   Subjective: Chief Complaint  Patient presents with   Lower Back - Pain, Follow-up    HPI 65 year old female returns she states she is more miserable but has not been to physical therapy.  She states Alvis Lemmings was coming out to her house but the referral expired.  She states she cannot make it out on the house several times a week for therapy.  States she can go downstairs but cannot come back upstairs due to leg weakness.  She has increased pain when she sits and she goes to dialysis.  States pain radiates across her back.  She is used Tylenol arthritis, over-the-counter creams.  Patient had uric acid drawn at dialysis which was normal.  MRI scan in July 2022 showed L4-5 broad-based disc protrusion with annular fissure.  Mild to moderate subarticular stenosis right and left.  Mild central canal narrowing.  Similar findings at L5-S1 moderate right mild left foraminal narrowing.  Review of Systems 14 point  systems updated unchanged from 10/09/2021.  Patient is on dialysis.  Past history with catheter related sepsis.   Objective: Vital Signs: BP 116/75   Ht 5' 1.5" (1.562 m)   Wt 198 lb (89.8 kg)   BMI 36.81 kg/m   Physical Exam Constitutional:      Appearance: She is well-developed.  HENT:     Head: Normocephalic.     Right Ear: External ear normal.     Left Ear: External ear normal. There is no impacted cerumen.  Eyes:     Pupils: Pupils are equal, round, and reactive to light.  Neck:     Thyroid: No thyromegaly.     Trachea: No tracheal deviation.  Cardiovascular:     Rate and Rhythm: Normal rate.  Pulmonary:     Effort: Pulmonary effort is normal.  Abdominal:     Palpations: Abdomen is soft.  Musculoskeletal:     Cervical back: No rigidity.  Skin:    General: Skin is warm and dry.  Neurological:     Mental Status: She is alert and oriented to person, place, and time.  Psychiatric:        Behavior: Behavior normal.    Ortho Exam dialysis catheter present chest wall below the clavicle.  Negative logroll the hips knees reach full extension.  She is slow to ambulate and has symmetrical weakness  with quads ankle dorsiflexion plantarflexion hip abductors all showing some mild weakness   Specialty Comments:  No specialty comments available.  Imaging: CLINICAL DATA:  Epidural abscess suspected. Additional provided: Bilateral flank pain, worsening for several weeks, dialysis patient, patient unable to walk due to pain.   EXAM: MRI LUMBAR SPINE WITHOUT CONTRAST   TECHNIQUE: Multiplanar, multisequence MR imaging of the lumbar spine was performed. No intravenous contrast was administered.   COMPARISON:  Radiographs of the lumbar spine 05/08/2020.   FINDINGS: Segmentation: 5 lumbar vertebrae. The caudal most well-formed intervertebral disc space is designated L5-S1.   Alignment:  Trace T11-T12, L1-L2 and L3-L4 grade 1 retrolisthesis.   Vertebrae: There are Schmorl  nodes within the T12 inferior endplate, L1 inferior endplate, L3 superior endplate and L3 inferior endplate. There is minimal marrow edema surrounding the Schmorl node within the T12 inferior endplate, and this Schmorl node may be acute or subacute. Trace multilevel endplate edema elsewhere, which appear degenerative. Mild edema within the right L5 articular pillar also appears degenerative (series 3, image 5). No specific findings to suggest discitis/osteomyelitis.   Conus medullaris and cauda equina: Conus extends to the L1-L2 level. No signal abnormality within the visualized distal spinal cord. No epidural abscess is identified on this noncontrast examination.   Paraspinal and other soft tissues: Atrophic kidneys. Atrophy of the lumbar paraspinal musculature. Additionally, there is nonspecific edema signal within the subcutaneous fat overlying the thoracolumbar spine bilaterally.   Disc levels:   Mild disc degeneration throughout the lumbar and visualized lower thoracic spine.   T11-T12: Imaged sagittally. Trace grade 1 retrolisthesis. Small disc bulge. Mild partial effacement of the ventral thecal sac without spinal cord mass effect. No appreciable significant foraminal stenosis.   T12-L1: Imaged sagittally. Minimal disc bulge. No significant spinal canal or foraminal stenosis.   L1-L2: Trace grade 1 retrolisthesis. No significant disc herniation or stenosis.   L2-L3: Shallow left subarticular/foraminal disc protrusion. Minimal left subarticular narrowing without appreciable nerve root impingement. Central canal patent. No significant foraminal stenosis.   L3-L4: Trace grade 1 retrolisthesis. Disc bulge. Superimposed broad-based central disc protrusion eccentric to the right. Mild facet arthrosis/ligamentum flavum hypertrophy. Trace right facet joint effusion. Mild bilateral subarticular and central canal narrowing without frank nerve root impingement. Mild  relative bilateral neural foraminal narrowing.   L4-L5: Disc bulge. Superimposed broad-based central disc protrusion at site of posterior annular fissure, eccentric to the right. Mild facet arthrosis/ligamentum flavum hypertrophy. Trace right facet joint effusion. The disc protrusion contributes to bilateral subarticular narrowing (mild/moderate right, mild left), contacting and crowding the descending right L5 nerve root (series 5, image 23). Mild relative narrowing of the central canal. Bilateral neural foraminal narrowing (mild/moderate right, mild left).   L5-S1: Disc bulge with endplate spurring. Superimposed broad-based central disc protrusion at site of posterior annular fissure. Mild facet arthrosis. The disc protrusion contributes to bilateral subarticular stenosis (moderate right, mild left). The disc protrusion contacts and crowds the descending right S1 nerve root. The disc protrusion may also contact the descending left S1 nerve root. Mild relative narrowing of the central canal. No significant foraminal stenosis.   IMPRESSION: No specific findings to suggest discitis/osteomyelitis at this time. No evidence of epidural abscess on this non-contrast study.   Schmorl nodes within the T12 inferior endplate, L1 inferior endplate, L3 superior endplate and L3 inferior endplate. There is mild marrow edema surrounding the Schmorl node within the T12 inferior endplate, and this Schmorl node may be acute/subacute. Minimal edema elsewhere, as described and  degenerative in appearance.   Lumbar spondylosis, as outlined and with findings most notably as follows.   At L4-L5, there is a broad-based central disc protrusion at site of posterior annular fissure. This contributes to multifactorial bilateral subarticular stenosis (mild/moderate right, mild left), contacting and crowding the descending right L5 nerve root. Mild central canal narrowing. Bilateral neural foraminal  narrowing (mild/moderate right, mild left). Trace right facet joint effusion.   At L5-S1, a broad-based central disc protrusion at site of posterior annular fissure contributes to multifactorial bilateral subarticular stenosis (moderate right, mild left). The disc protrusion contacts and crowds the descending right S1 nerve root. The disc protrusion may also contact the descending left S1 nerve root. Mild relative narrowing of the central canal.   No more than mild spinal canal or neural foraminal narrowing at the remaining levels.   Trace right facet joint effusion also present at L3-L4.     Electronically Signed   By: Kellie Simmering DO   On: 07/18/2021 14:05   PMFS History: Patient Active Problem List   Diagnosis Date Noted   Protrusion of lumbar intervertebral disc 10/09/2021   Facet arthritis, degenerative, lumbar spine 10/09/2021   Acute low back pain    MSSA bacteremia    Septic shock ([place]) 07/16/2021   Nausea and vomiting    Abdominal pain    FUO (fever of unknown origin)    Polyarthritis    Polyarthralgia    Hyperkalemia, diminished renal excretion 05/28/2021   Generalized weakness 05/28/2021   Rash and nonspecific skin eruption 05/28/2021   Atypical chest pain    Coronary artery calcification seen on CAT scan    Hyperkalemia 05/09/2020   Left leg pain 05/09/2020   Restless leg syndrome 09/19/2018   OSA (obstructive sleep apnea) 04/29/2018   ESRD (end stage renal disease) ([place]) 04/29/2018   Dyspnea on exertion 03/12/2017   Morbid (severe) obesity due to excess calories ([place]) 03/12/2017   Angina pectoris ([place]) 03/02/2017   Anemia of chronic kidney failure, stage 3 (moderate) ([place]) 12/10/2016   History of colon cancer 08/12/2016   Mass of left breast on mammogram 12/10/2015   Depression 10/18/2015   Numbness and tingling of left arm and leg 01/24/2015   PVD (peripheral vascular disease) with claudication ([place]) 03/23/2014   PAD (peripheral artery disease) ([place])  12/14/2013   Chronic diastolic heart failure ([place]) 11/15/2013   Essential hypertension 11/15/2013   Leucocytosis 04/28/2012   Past Medical History:  Diagnosis Date   Anemia    Anxiety    CHF (congestive heart failure) ([place])    Colon cancer ([place])    treatment surgery   Complication of anesthesia    after first C- [place] "couldnt walk after", patient denies having a spinal   COPD (chronic obstructive pulmonary disease) ([place])    Coronary artery disease    Depression    DVT (deep venous thrombosis) ([place])    ESRD (end stage renal disease) ([place])    Hemo: MWF   History of blood transfusion 04/2018   Hypertension    07/07/18- no longer takes BP medications   Meningitis    Pain in limb 07/30/2013   PE (pulmonary embolism)    Peripheral vascular disease (HCC)    Restless legs    Shortness of breath    with exertion   Sleep apnea    SOB (shortness of breath) 03/03/2019   Vertigo     Family History  Problem Relation Age of Onset   Cancer Mother 48  breast and bone   Cancer Father 58       prostate   Hypertension Sister    Bleeding Disorder Sister    Cancer Cousin 26       breast cancer    Hypertension Daughter    Breast cancer Neg Hx     Past Surgical History:  Procedure Laterality Date   ABDOMINAL AORTAGRAM N/A 07/26/2013   Procedure: ABDOMINAL Maxcine Ham;  Surgeon: [name] Murtaugh, MD;  Location: [facility] CATH LAB;  Service: Cardiovascular;  Laterality: N/A;   ABDOMINAL HYSTERECTOMY     AV FISTULA PLACEMENT Left 05/05/2018   Procedure: ARTERIOVENOUS (AV) FISTULA CREATION BRACHIOCEPHALIC;  Surgeon: Waynetta Sandy, MD;  Location: [facility];  Service: Vascular;  Laterality: Left;   AV FISTULA PLACEMENT Left 07/16/2018   Procedure: ARTERIOVENOUS FISTULA CREATION;  Surgeon: Waynetta Sandy, MD;  Location: [facility];  Service: Vascular;  Laterality: Left;   [place] Left 09/03/2018   Procedure: BASILIC VEIN TRANSPOSITION SECOND STAGE;  Surgeon: Waynetta Sandy, MD;  Location: [facility];  Service: Vascular;  Laterality: Left;   BREAST BIOPSY Left    CESAREAN SECTION     X 3 1974-1977   CHOLECYSTECTOMY     CHOLECYSTECTOMY  1980/s   COLECTOMY  2010   DIVERTICULOSIS SURGERY-2002  2012   FEMORAL-POPLITEAL BYPASS GRAFT Left 08/13/2013   Procedure: BYPASS GRAFT FEMORAL-POPLITEAL ARTERY WITH NON-REVERSED SAPHANEOUS VEIN; ULTRASOUND GUIDED;  Surgeon: Mal [name], MD;  Location: [facility];  Service: Vascular;  Laterality: Left;   INSERTION OF DIALYSIS CATHETER Right 04/30/2018   Procedure: INSERTION OF Right Internal Jugular DIALYSIS CATHETER;  Surgeon: [name], MD;  Location: [facility] OR;  Service: Vascular;  Laterality: Right;   IR FLUORO GUIDE CV LINE RIGHT  07/24/2020   IR FLUORO GUIDE CV LINE RIGHT  07/21/2021   IR REMOVAL TUN CV CATH W/O FL  07/21/2020   IR REMOVAL TUN CV CATH W/O FL  07/19/2021   IR US GUIDE VASC ACCESS RIGHT  07/24/2020   IR US GUIDE VASC ACCESS RIGHT  07/21/2021   LEFT HEART CATH AND CORONARY ANGIOGRAPHY N/A 05/01/2021   Procedure: LEFT HEART CATH AND CORONARY ANGIOGRAPHY;  Surgeon: [name], MD;  Location: [facility] CV LAB;  Service: Cardiovascular;  Laterality: N/A;   LOWER EXTREMITY ANGIOGRAM Left 07/26/2013   Procedure: LOWER EXTREMITY ANGIOGRAM;  Surgeon: [name] Harmony, MD;  Location: [facility] CATH LAB;  Service: Cardiovascular;  Laterality: Left;   Social History   Occupational History   Occupation: N/A  Tobacco Use   Smoking status: Some Days    Packs/day: 0.50    Years: 50.00    Pack years: 25.00    Types: Cigarettes    Last attempt to quit: 02/03/2021    Years since quitting: 0.7   Smokeless tobacco: Former  Scientific laboratory technician Use: Never used  Substance and Sexual Activity   Alcohol use: No    Alcohol/week: 0.0 standard drinks    Comment: she used to drink alcohol, quit in 2010; h/o heavy use    Drug use: Not Currently    Types: Oxycodone   Sexual activity: Not Currently

## 2021-11-15 ENCOUNTER — Other Ambulatory Visit: Payer: Self-pay

## 2021-11-15 ENCOUNTER — Ambulatory Visit (INDEPENDENT_AMBULATORY_CARE_PROVIDER_SITE_OTHER): Payer: Medicare Other | Admitting: Nurse Practitioner

## 2021-11-15 ENCOUNTER — Encounter: Payer: Self-pay | Admitting: Nurse Practitioner

## 2021-11-15 VITALS — BP 110/62 | HR 108 | Temp 98.7°F | Ht 61.5 in | Wt 199.6 lb

## 2021-11-15 DIAGNOSIS — N186 End stage renal disease: Secondary | ICD-10-CM | POA: Diagnosis not present

## 2021-11-15 DIAGNOSIS — H538 Other visual disturbances: Secondary | ICD-10-CM

## 2021-11-15 DIAGNOSIS — I10 Essential (primary) hypertension: Secondary | ICD-10-CM | POA: Diagnosis not present

## 2021-11-15 DIAGNOSIS — J449 Chronic obstructive pulmonary disease, unspecified: Secondary | ICD-10-CM | POA: Diagnosis not present

## 2021-11-15 DIAGNOSIS — Z992 Dependence on renal dialysis: Secondary | ICD-10-CM

## 2021-11-15 DIAGNOSIS — M791 Myalgia, unspecified site: Secondary | ICD-10-CM | POA: Diagnosis not present

## 2021-11-15 DIAGNOSIS — I129 Hypertensive chronic kidney disease with stage 1 through stage 4 chronic kidney disease, or unspecified chronic kidney disease: Secondary | ICD-10-CM | POA: Diagnosis not present

## 2021-11-15 DIAGNOSIS — Z6836 Body mass index (BMI) 36.0-36.9, adult: Secondary | ICD-10-CM

## 2021-11-15 DIAGNOSIS — M5126 Other intervertebral disc displacement, lumbar region: Secondary | ICD-10-CM

## 2021-11-15 DIAGNOSIS — E559 Vitamin D deficiency, unspecified: Secondary | ICD-10-CM | POA: Diagnosis not present

## 2021-11-15 MED ORDER — CYCLOBENZAPRINE HCL 10 MG PO TABS: 10.0000 mg | ORAL_TABLET | Freq: Three times a day (TID) | ORAL | 1 refills | Status: DC | PRN

## 2021-11-15 NOTE — Chronic Care Management (AMB) (Signed)
  Care Management   Note  11/15/2021 Name: Whitney Walker MRN: [id number] DOB: 02/27/1956  Whitney Walker is a 65 y.o. year old female who is a primary care patient of [name], [place] and is actively engaged with the care management team. I reached out to Kyra Leyland by phone today to assist with re-scheduling a follow up visit with the RN Case Manager  Follow up plan: Telephone appointment with care management team member scheduled for:12/11/21  [place] Management  Direct Dial: [phone]

## 2021-11-15 NOTE — Progress Notes (Signed)
I,Tianna Badgett,acting as a Education administrator for [employer], FNP.,have documented all relevant documentation on the behalf of [name], FNP,as directed by  [name], FNP while in the presence of [name], [place].  This visit occurred during the SARS-CoV-2 public health emergency.  Safety protocols were in place, including screening questions prior to the visit, additional usage of staff PPE, and extensive cleaning of exam room while observing appropriate contact time as indicated for disinfecting solutions.  Subjective:     Patient ID: Whitney Walker , female    DOB: [birth year] , 65 y.o.   MRN: [id number]   Chief Complaint  Patient presents with   Hypertension    HPI  Patient presents today for a htn follow up. Blood pressure continues to be elevated sometimes when she gets to dialysis then will bring down once she goes on the machine.  She is going to Dr Lorin Mercy and he is setting her up again for physical therapy. She has been taking tylenol for pain and will take an Oxycodone at bedtime which will last her to get her through dialysis. She has been having arthritis type pain to her knee since having cold weather. She is using the walker at the house.   She has also been to a Rheumatologist.   Hypertension This is a chronic problem. The current episode started more than 1 year ago. The problem is controlled. Pertinent negatives include no anxiety, headaches, palpitations or peripheral edema. Past treatments include calcium channel blockers. Compliance problems include exercise.  Identifiable causes of hypertension include chronic renal disease. There is no history of sleep apnea.    Past Medical History:  Diagnosis Date   Anemia    Anxiety    CHF (congestive heart failure) (HCC)    Colon cancer (HCC)    treatment surgery   Complication of anesthesia    after first C- [place] "couldnt walk after", patient denies having a spinal   COPD (chronic obstructive pulmonary disease) ([place])     Coronary artery disease    Depression    DVT (deep venous thrombosis) (HCC)    ESRD (end stage renal disease) ([place])    Hemo: MWF   History of blood transfusion 04/2018   Hypertension    07/07/18- no longer takes BP medications   Meningitis    Pain in limb 07/30/2013   PE (pulmonary embolism)    Peripheral vascular disease (HCC)    Restless legs    Shortness of breath    with exertion   Sleep apnea    SOB (shortness of breath) 03/03/2019   Vertigo      Family History  Problem Relation Age of Onset   Cancer Mother 71       breast and bone   Cancer Father 46       prostate   Hypertension Sister    Bleeding Disorder Sister    Cancer Cousin 20       breast cancer    Hypertension Daughter    Breast cancer Neg Hx      Current Outpatient Medications:    cyclobenzaprine (FLEXERIL) 10 MG tablet, Take 1 tablet (10 mg total) by mouth 3 (three) times daily as needed for muscle spasms., Disp: 30 tablet, Rfl: 1   acetaminophen (TYLENOL) 500 MG tablet, Take 1,000 mg by mouth 2 (two) times daily as needed for moderate pain or headache., Disp: , Rfl:    albuterol (VENTOLIN HFA) 108 (90 Base) MCG/ACT inhaler, Inhale 2 puffs into the  lungs every 6 (six) hours as needed for wheezing or shortness of breath., Disp: 18 g, Rfl: 0   Ascorbic Acid (VITAMIN C) 100 MG tablet, Take 100 mg by mouth daily., Disp: , Rfl:    atorvastatin (LIPITOR) 10 MG tablet, TAKE ONE TABLET BY MOUTH ONCE DAILY, Disp: 30 tablet, Rfl: 2   AURYXIA 1 GM 210 MG(Fe) tablet, Take 420-840 mg by mouth with breakfast, with lunch, and with evening meal. Taking  2 tablets daily (420mg) with each snack, Disp: , Rfl:    Biotin 10000 MCG TABS, Take 10,000 mcg by mouth daily. , Disp: , Rfl:    carvedilol (COREG) 6.25 MG tablet, Take 1 tablet (6.25 mg total) by mouth 2 (two) times daily., Disp: 180 tablet, Rfl: 3   ELIQUIS 2.5 MG TABS tablet, TAKE ONE TABLET BY MOUTH TWICE DAILY, Disp: 60 tablet, Rfl: 2   lidocaine (LIDODERM) 5 %, Place 1  patch onto the skin daily as needed. Remove & Discard patch within 12 hours or as directed by MD, Disp: 30 patch, Rfl: 2   meclizine (ANTIVERT) 12.5 MG tablet, Take 1 tablet (12.5 mg total) by mouth 3 (three) times daily as needed for dizziness., Disp: 30 tablet, Rfl: 0   multivitamin (RENA-VIT) TABS tablet, Take 1 tablet by mouth at bedtime., Disp: , Rfl:    mupirocin ointment (BACTROBAN) 2 %, Apply 1 application topically daily as needed (skin bumps)., Disp: 22 g, Rfl: 2   Nutritional Supplements (,FEEDING SUPPLEMENT, PROSOURCE PLUS) liquid, Take 30 mLs by mouth 2 (two) times daily between meals., Disp: 60 mL, Rfl: 0   omeprazole (PRILOSEC) 20 MG capsule, TAKE ONE CAPSULE BY MOUTH ONCE DAILY, Disp: 30 capsule, Rfl: 1   oxyCODONE-acetaminophen (PERCOCET) 5-325 MG tablet, Take 1 tablet by mouth every 4 (four) hours as needed for severe pain., Disp: 30 tablet, Rfl: 0   vitamin E 200 UNIT capsule, Take 200 Units by mouth daily., Disp: , Rfl:    Allergies  Allergen Reactions   Carnosine     Other reaction(s): Unknown   Gadolinium Derivatives Hives and Other (See Comments)    HIVES, Desc: HIVES W/ "DYE" USED FOR 1ST CT SCAN BUT NOT 2ND, NO PREMEDS USED, PT UNCERTAIN OF CIRCUMSTANCES,,?POSSIBLE MRI CONTRAST ALLERGY, ALL STUDIES DONE "SOMEWHERE" IN [place]//A.C., Onset Date: 09032006   Iohexol Other (See Comments)     Code: HIVES, Desc: HIVES W/ "DYE" USED FOR 1ST CT SCAN BUT NOT 2ND, NO PREMEDS USED, PT UNCERTAIN OF CIRCUMSTANCES,,?POSSIBLE MRI CONTRAST ALLERGY, ALL STUDIES DONE "SOMEWHERE" IN [place]//A.C., Onset Date: 09032006    Iodine Hives   Naltrexone     Other reaction(s): Unknown     Review of Systems  Constitutional: Negative.   Respiratory: Negative.    Cardiovascular:  Negative for palpitations.  Gastrointestinal: Negative.   Musculoskeletal:  Positive for arthralgias.  Neurological: Negative.  Negative for headaches.    Today's Vitals   11/15/21 1057  BP: 110/62   Pulse: (!) 108  Temp: 98.7 F (37.1 C)  TempSrc: Oral  Weight: 199 lb 9.6 oz (90.5 kg)  Height: 5' 1.5" (1.562 m)   Body mass index is 37.1 kg/m.  Wt Readings from Last 3 Encounters:  11/15/21 199 lb 9.6 oz (90.5 kg)  11/13/21 198 lb (89.8 kg)  09/13/21 200 lb 9.6 oz (91 kg)    Objective:  Physical Exam Vitals reviewed.  Constitutional:      General: She is not in acute distress.    Appearance: Normal appearance. She   is obese.  Cardiovascular:     Rate and Rhythm: Normal rate and regular rhythm.     Pulses: Normal pulses.     Heart sounds: Normal heart sounds. No murmur heard. Pulmonary:     Effort: Pulmonary effort is normal. No respiratory distress.     Breath sounds: Normal breath sounds. No wheezing.  Skin:    General: Skin is warm and dry.     Capillary Refill: Capillary refill takes less than 2 seconds.     Comments: Vas cath present  Neurological:     General: No focal deficit present.     Mental Status: She is alert and oriented to person, place, and time.     Cranial Nerves: No cranial nerve deficit.     Motor: No weakness.  Psychiatric:        Mood and Affect: Mood normal.        Behavior: Behavior normal.        Thought Content: Thought content normal.        Judgment: Judgment normal.        Assessment And Plan:     1. Essential hypertension Comments: Blood pressure is controlled, continue current medications as per dialysis/nephrology - CMP14+EGFR  2. Obstructive chronic bronchitis without exacerbation (HCC) Comments: Stable  3. ESRD on dialysis (HCC) Comments: She has been doing well, continue treatment  4. Class 2 severe obesity due to excess calories with serious comorbidity and body mass index (BMI) of 36.0 to 36.9 in adult (HCC)  She is encouraged to strive for BMI less than 30 to decrease cardiac risk. Advised to aim for at least 150 minutes of exercise per week. - Hemoglobin A1c  5. Protrusion of lumbar intervertebral disc Comments:  She is unable to take Tumeric due to her taking Eliquis. She has taken lidocaine.   6. Vitamin D deficiency Will check vitamin D level and supplement as needed.    Also encouraged to spend 15 minutes in the sun daily.  - CMP14+EGFR - VITAMIN D 25 Hydroxy (Vit-D Deficiency, Fractures)  7. Muscle tension pain Comments: Encouraged to stretch regularly and provided muscle relaxer - cyclobenzaprine (FLEXERIL) 10 MG tablet; Take 1 tablet (10 mg total) by mouth 3 (three) times daily as needed for muscle spasms.  Dispense: 30 tablet; Refill: 1  8. Blurred vision, left eye Comments: This has been ongoing and reports she has glaucoma, I will refer to another opthalmologist at patient request - Ambulatory referral to Ophthalmology    Patient was given opportunity to ask questions. Patient verbalized understanding of the plan and was able to repeat key elements of the plan. All questions were answered to their satisfaction.   , FNP   I,  , FNP, have reviewed all documentation for this visit. The documentation on 11/14/21 for the exam, diagnosis, procedures, and orders are all accurate and complete.   IF YOU HAVE BEEN REFERRED TO A SPECIALIST, IT MAY TAKE 1-2 WEEKS TO SCHEDULE/PROCESS THE REFERRAL. IF YOU HAVE NOT HEARD FROM US/SPECIALIST IN TWO WEEKS, PLEASE GIVE US A CALL AT [phone] X [phone].   THE PATIENT IS ENCOURAGED TO PRACTICE SOCIAL DISTANCING DUE TO THE COVID-19 PANDEMIC.    

## 2021-11-15 NOTE — Patient Instructions (Signed)

## 2021-11-16 DIAGNOSIS — Z992 Dependence on renal dialysis: Secondary | ICD-10-CM | POA: Diagnosis not present

## 2021-11-16 DIAGNOSIS — N2581 Secondary hyperparathyroidism of renal origin: Secondary | ICD-10-CM | POA: Diagnosis not present

## 2021-11-16 DIAGNOSIS — D689 Coagulation defect, unspecified: Secondary | ICD-10-CM | POA: Diagnosis not present

## 2021-11-16 DIAGNOSIS — N186 End stage renal disease: Secondary | ICD-10-CM | POA: Diagnosis not present

## 2021-11-16 DIAGNOSIS — E875 Hyperkalemia: Secondary | ICD-10-CM | POA: Diagnosis not present

## 2021-11-16 LAB — CMP14+EGFR
ALT: 29 IU/L (ref 0–32)
AST: 30 IU/L (ref 0–40)
Albumin/Globulin Ratio: 1.1 — ABNORMAL LOW (ref 1.2–2.2)
Albumin: 3.9 g/dL (ref 3.8–4.8)
Alkaline Phosphatase: 196 IU/L — ABNORMAL HIGH (ref 44–121)
BUN/Creatinine Ratio: 4 — ABNORMAL LOW (ref 12–28)
BUN: 25 mg/dL (ref 8–27)
Bilirubin Total: 0.3 mg/dL (ref 0.0–1.2)
CO2: 18 mmol/L — ABNORMAL LOW (ref 20–29)
Calcium: 8.9 mg/dL (ref 8.7–10.3)
Chloride: 99 mmol/L (ref 96–106)
Creatinine, Ser: 6.96 mg/dL — ABNORMAL HIGH (ref 0.57–1.00)
Globulin, Total: 3.5 g/dL (ref 1.5–4.5)
Glucose: 84 mg/dL (ref 70–99)
Potassium: 5.3 mmol/L — ABNORMAL HIGH (ref 3.5–5.2)
Sodium: 141 mmol/L (ref 134–144)
Total Protein: 7.4 g/dL (ref 6.0–8.5)
eGFR: 6 mL/min/{1.73_m2} — ABNORMAL LOW (ref >59)

## 2021-11-16 LAB — HEMOGLOBIN A1C
Est. average glucose Bld gHb Est-mCnc: 123 mg/dL
Hgb A1c MFr Bld: 5.9 % — ABNORMAL HIGH (ref 4.8–5.6)

## 2021-11-16 LAB — VITAMIN D 25 HYDROXY (VIT D DEFICIENCY, FRACTURES): Vit D, 25-Hydroxy: 9 ng/mL — ABNORMAL LOW (ref 30.0–100.0)

## 2021-11-19 ENCOUNTER — Telehealth: Payer: Medicare Other

## 2021-11-19 DIAGNOSIS — Z992 Dependence on renal dialysis: Secondary | ICD-10-CM | POA: Diagnosis not present

## 2021-11-19 DIAGNOSIS — D689 Coagulation defect, unspecified: Secondary | ICD-10-CM | POA: Diagnosis not present

## 2021-11-19 DIAGNOSIS — E875 Hyperkalemia: Secondary | ICD-10-CM | POA: Diagnosis not present

## 2021-11-19 DIAGNOSIS — N2581 Secondary hyperparathyroidism of renal origin: Secondary | ICD-10-CM | POA: Diagnosis not present

## 2021-11-19 DIAGNOSIS — N186 End stage renal disease: Secondary | ICD-10-CM | POA: Diagnosis not present

## 2021-11-21 DIAGNOSIS — D689 Coagulation defect, unspecified: Secondary | ICD-10-CM | POA: Diagnosis not present

## 2021-11-21 DIAGNOSIS — Z992 Dependence on renal dialysis: Secondary | ICD-10-CM | POA: Diagnosis not present

## 2021-11-21 DIAGNOSIS — N2581 Secondary hyperparathyroidism of renal origin: Secondary | ICD-10-CM | POA: Diagnosis not present

## 2021-11-21 DIAGNOSIS — E875 Hyperkalemia: Secondary | ICD-10-CM | POA: Diagnosis not present

## 2021-11-21 DIAGNOSIS — N186 End stage renal disease: Secondary | ICD-10-CM | POA: Diagnosis not present

## 2021-11-24 DIAGNOSIS — E875 Hyperkalemia: Secondary | ICD-10-CM | POA: Diagnosis not present

## 2021-11-24 DIAGNOSIS — N186 End stage renal disease: Secondary | ICD-10-CM | POA: Diagnosis not present

## 2021-11-24 DIAGNOSIS — N2581 Secondary hyperparathyroidism of renal origin: Secondary | ICD-10-CM | POA: Diagnosis not present

## 2021-11-24 DIAGNOSIS — D689 Coagulation defect, unspecified: Secondary | ICD-10-CM | POA: Diagnosis not present

## 2021-11-24 DIAGNOSIS — Z992 Dependence on renal dialysis: Secondary | ICD-10-CM | POA: Diagnosis not present

## 2021-11-26 ENCOUNTER — Other Ambulatory Visit: Payer: Self-pay | Admitting: Hematology

## 2021-11-26 DIAGNOSIS — K219 Gastro-esophageal reflux disease without esophagitis: Secondary | ICD-10-CM

## 2021-11-26 DIAGNOSIS — N186 End stage renal disease: Secondary | ICD-10-CM | POA: Diagnosis not present

## 2021-11-26 DIAGNOSIS — E875 Hyperkalemia: Secondary | ICD-10-CM | POA: Diagnosis not present

## 2021-11-26 DIAGNOSIS — N2581 Secondary hyperparathyroidism of renal origin: Secondary | ICD-10-CM | POA: Diagnosis not present

## 2021-11-26 DIAGNOSIS — Z992 Dependence on renal dialysis: Secondary | ICD-10-CM | POA: Diagnosis not present

## 2021-11-26 DIAGNOSIS — D689 Coagulation defect, unspecified: Secondary | ICD-10-CM | POA: Diagnosis not present

## 2021-11-26 NOTE — Progress Notes (Signed)
Primary Physician/Referring:  [name], FNP  Patient ID: Whitney Walker, female    DOB: 12/29/1956, 65 y.o.   MRN: [id number]  Chief Complaint  Patient presents with   Coronary Artery Disease   Follow-up   HPI:    Whitney Walker  is a 65 y.o. African-American female with history of obesity, hypertension, hyperlipidemia, tobacco use, stage IV kidney disease on dialysis, obstructive sleep apnea not on CPAP, COPD and severe peripheral arterial disease with history of left femoral-popliteal bypass surgery by Dr. Victorino Dike in 2014.  Patient is on chronic anticoagulation with Eliquis due to recurrent DVT and PE in 2014 and 2016.  Patient is followed by hematology for hypercoagulable state.  She is currently undergoing hemodialysis on a Monday/Wednesday/Friday schedule.  Patient presents for 8-week follow-up of hyperlipidemia and hypertension.  Last office visit resumed carvedilol 6.25 mg twice daily given uncontrolled hypertension and elevated heart rate.  Unfortunately previously ordered BNP and lipid profile testing have not been done.  At last office visit recommended additional lipid management therapy, however patient was resistant at the time. Presently patient denies dyspnea, syncope, near syncope, dizziness, leg swelling, orthopnea, PND.  She is still non-compliant with CPAP. She has had no recurrence of chest pain since last office visit. She does note that heart rate has been elevated at 100-110 bpm at dialysis.   Past Medical History:  Diagnosis Date   Anemia    Anxiety    CHF (congestive heart failure) ([place])    Colon cancer ([place])    treatment surgery   Complication of anesthesia    after first C- [place] "couldnt walk after", patient denies having a spinal   COPD (chronic obstructive pulmonary disease) ([place])    Coronary artery disease    Depression    DVT (deep venous thrombosis) (HCC)    ESRD (end stage renal disease) ([place])    Hemo: MWF   History of blood  transfusion 04/2018   Hypertension    07/07/18- no longer takes BP medications   Meningitis    Pain in limb 07/30/2013   PE (pulmonary embolism)    Peripheral vascular disease (HCC)    Restless legs    Shortness of breath    with exertion   Sleep apnea    SOB (shortness of breath) 03/03/2019   Vertigo    Past Surgical History:  Procedure Laterality Date   ABDOMINAL AORTAGRAM N/A 07/26/2013   Procedure: ABDOMINAL Maxcine Ham;  Surgeon: [name] Ontonagon, MD;  Location: [facility] CATH LAB;  Service: Cardiovascular;  Laterality: N/A;   ABDOMINAL HYSTERECTOMY     AV FISTULA PLACEMENT Left 05/05/2018   Procedure: ARTERIOVENOUS (AV) FISTULA CREATION BRACHIOCEPHALIC;  Surgeon: Waynetta Sandy, MD;  Location: [place];  Service: Vascular;  Laterality: Left;   AV FISTULA PLACEMENT Left 07/16/2018   Procedure: ARTERIOVENOUS FISTULA CREATION;  Surgeon: Waynetta Sandy, MD;  Location: [place];  Service: Vascular;  Laterality: Left;   [place] Left 09/03/2018   Procedure: BASILIC VEIN TRANSPOSITION SECOND STAGE;  Surgeon: Waynetta Sandy, MD;  Location: [place];  Service: Vascular;  Laterality: Left;   BREAST BIOPSY Left    CESAREAN SECTION     X 3 1974-1977   CHOLECYSTECTOMY     CHOLECYSTECTOMY  1980/s   COLECTOMY  2010   DIVERTICULOSIS SURGERY-2002  2012   FEMORAL-POPLITEAL BYPASS GRAFT Left 08/13/2013   Procedure: BYPASS GRAFT FEMORAL-POPLITEAL ARTERY WITH NON-REVERSED SAPHANEOUS VEIN; ULTRASOUND GUIDED;  Surgeon: Mal [name], MD;  Location: [facility] OR;  Service: Vascular;  Laterality: Left;   INSERTION OF DIALYSIS CATHETER Right 04/30/2018   Procedure: INSERTION OF Right Internal Jugular DIALYSIS CATHETER;  Surgeon: [name], MD;  Location: [facility] OR;  Service: Vascular;  Laterality: Right;   IR FLUORO GUIDE CV LINE RIGHT  07/24/2020   IR FLUORO GUIDE CV LINE RIGHT  07/21/2021   IR REMOVAL TUN CV CATH W/O FL  07/21/2020   IR REMOVAL TUN CV CATH W/O FL  07/19/2021   IR US  GUIDE VASC ACCESS RIGHT  07/24/2020   IR US GUIDE VASC ACCESS RIGHT  07/21/2021   LEFT HEART CATH AND CORONARY ANGIOGRAPHY N/A 05/01/2021   Procedure: LEFT HEART CATH AND CORONARY ANGIOGRAPHY;  Surgeon: [name], MD;  Location: [place] CV LAB;  Service: Cardiovascular;  Laterality: N/A;   LOWER EXTREMITY ANGIOGRAM Left 07/26/2013   Procedure: LOWER EXTREMITY ANGIOGRAM;  Surgeon: [name] Oliver, MD;  Location: [facility] CATH LAB;  Service: Cardiovascular;  Laterality: Left;   Family History  Problem Relation Age of Onset   Cancer Mother 60       breast and bone   Cancer Father 36       prostate   Hypertension Sister    Bleeding Disorder Sister    Cancer Cousin 10       breast cancer    Hypertension Daughter    Breast cancer Neg Hx     Social History   Tobacco Use   Smoking status: Some Days    Packs/day: 0.50    Years: 50.00    Pack years: 25.00    Types: Cigarettes    Last attempt to quit: 02/03/2021    Years since quitting: 0.8   Smokeless tobacco: Former   Tobacco comments:    She is using a vape  Substance Use Topics   Alcohol use: No    Alcohol/week: 0.0 standard drinks    Comment: she used to drink alcohol, quit in 2010; h/o heavy use    Marital Status: Single   ROS  Review of Systems  Constitutional: Negative for malaise/fatigue and weight gain.  Cardiovascular:  Positive for palpitations. Negative for chest pain, claudication, leg swelling, near-syncope, orthopnea, paroxysmal nocturnal dyspnea and syncope.  Respiratory:  Negative for shortness of breath.   Neurological:  Negative for dizziness.   Objective  Blood pressure (!) 125/59, pulse (!) 102, resp. rate 16, height 5\' 1"  (1.549 m), weight 202 lb 6.4 oz (91.8 kg), SpO2 94 %.  Vitals with BMI 11/27/2021 11/15/2021 11/13/2021  Height 5\' 1"  5' 1.5" 5' 1.5"  Weight 202 lbs 6 oz 199 lbs 10 oz 198 lbs  BMI 38.26 23.76 28.31  Systolic 517 616 073  Diastolic 59 62 75  Pulse 710 108 -    Physical Exam Vitals  reviewed.  Constitutional:      Appearance: She is obese.  Cardiovascular:     Rate and Rhythm: Regular rhythm. Tachycardia present.     Pulses: Intact distal pulses.     Heart sounds: S1 normal and S2 normal. Murmur heard.  Harsh midsystolic murmur is present at the upper right sternal border.    No friction rub. No gallop.     Comments: Tunneled dialysis catheter in place  Pulmonary:     Effort: Pulmonary effort is normal. No respiratory distress.     Breath sounds: No wheezing, rhonchi or rales.  Musculoskeletal:     Right lower leg: No edema.     Left  lower leg: No edema.  Neurological:     Mental Status: She is alert.   Laboratory examination:   Recent Labs    07/20/21 0149 07/21/21 0120 07/22/21 0046 11/15/21 1145  NA 136 133* 134* 141  K 3.8 3.6 4.0 5.3*  CL 102 101 97* 99  CO2 21* 17* 22 18*  GLUCOSE 121* 126* 78 84  BUN 34* 42* 18 25  CREATININE 8.09* 9.47* 6.33* 6.96*  CALCIUM 6.9* 7.0* 7.8* 8.9  GFRNONAA 5* 4* 7*  --    estimated creatinine clearance is 8.2 mL/min (A) (by C-G formula based on SCr of 6.96 mg/dL (H)).  CMP Latest Ref Rng & Units 11/15/2021 07/22/2021 07/21/2021  Glucose 70 - 99 mg/dL 84 78 126(H)  BUN 8 - 27 mg/dL 25 18 42(H)  Creatinine 0.57 - 1.00 mg/dL 6.96(H) 6.33(H) 9.47(H)  Sodium 134 - 144 mmol/L 141 134(L) 133(L)  Potassium 3.5 - 5.2 mmol/L 5.3(H) 4.0 3.6  Chloride 96 - 106 mmol/L 99 97(L) 101  CO2 20 - 29 mmol/L 18(L) 22 17(L)  Calcium 8.7 - 10.3 mg/dL 8.9 7.8(L) 7.0(L)  Total Protein 6.0 - 8.5 g/dL 7.4 - -  Total Bilirubin 0.0 - 1.2 mg/dL 0.3 - -  Alkaline Phos 44 - 121 IU/L 196(H) - -  AST 0 - 40 IU/L 30 - -  ALT 0 - 32 IU/L 29 - -   CBC Latest Ref Rng & Units 07/22/2021 07/21/2021 07/20/2021  WBC 4.0 - 10.5 K/uL 19.9(H) 15.5(H) 13.2(H)  Hemoglobin 12.0 - 15.0 g/dL 10.2(L) 8.9(L) 9.3(L)  Hematocrit 36.0 - 46.0 % 32.5(L) 27.7(L) 28.8(L)  Platelets 150 - 400 K/uL 184 206 160    Lipid Panel Recent Labs    03/15/21 1143  CHOL  183  TRIG 159*  LDLCALC 100*  HDL 55  CHOLHDL 3.3    HEMOGLOBIN A1C Lab Results  Component Value Date   HGBA1C 5.9 (H) 11/15/2021   MPG 131.24 07/16/2021   TSH No results for input(s): TSH in the last 8760 hours.  External labs:   06/04/2018: HDL 63, LDL 97, total cholesterol 191, triglycerides 155  Allergies   Allergies  Allergen Reactions   Carnosine     Other reaction(s): Unknown   Gadolinium Derivatives Hives and Other (See Comments)    HIVES, Desc: HIVES W/ "DYE" USED FOR 1ST CT SCAN BUT NOT 2ND, NO PREMEDS USED, PT UNCERTAIN OF CIRCUMSTANCES,,?POSSIBLE MRI CONTRAST ALLERGY, ALL STUDIES DONE "SOMEWHERE" IN [place]//A.C., Onset Date: [id number]   Iohexol Other (See Comments)     Code: HIVES, Desc: HIVES W/ "DYE" USED FOR 1ST CT SCAN BUT NOT 2ND, NO PREMEDS USED, PT UNCERTAIN OF CIRCUMSTANCES,,?POSSIBLE MRI CONTRAST ALLERGY, ALL STUDIES DONE "SOMEWHERE" IN [place]//A.C., Onset Date: [id number]    Iodine Hives   Naltrexone     Other reaction(s): Unknown    Medications Prior to Visit:   Outpatient Medications Prior to Visit  Medication Sig Dispense Refill   acetaminophen (TYLENOL) 500 MG tablet Take 1,000 mg by mouth 2 (two) times daily as needed for moderate pain or headache.     albuterol (VENTOLIN HFA) 108 (90 Base) MCG/ACT inhaler Inhale 2 puffs into the lungs every 6 (six) hours as needed for wheezing or shortness of breath. 18 g 0   Ascorbic Acid (VITAMIN C) 100 MG tablet Take 100 mg by mouth daily.     atorvastatin (LIPITOR) 10 MG tablet TAKE ONE TABLET BY MOUTH ONCE DAILY 30 tablet 2   Biotin 10000 MCG TABS Take  10,000 mcg by mouth daily.      cyclobenzaprine (FLEXERIL) 10 MG tablet Take 1 tablet (10 mg total) by mouth 3 (three) times daily as needed for muscle spasms. 30 tablet 1   ELIQUIS 2.5 MG TABS tablet TAKE ONE TABLET BY MOUTH TWICE DAILY 60 tablet 2   lidocaine (LIDODERM) 5 % Place 1 patch onto the skin daily as needed. Remove & Discard patch within  12 hours or as directed by MD 30 patch 2   meclizine (ANTIVERT) 12.5 MG tablet Take 1 tablet (12.5 mg total) by mouth 3 (three) times daily as needed for dizziness. 30 tablet 0   multivitamin (RENA-VIT) TABS tablet Take 1 tablet by mouth at bedtime.     mupirocin ointment (BACTROBAN) 2 % Apply 1 application topically daily as needed (skin bumps). 22 g 2   omeprazole (PRILOSEC) 20 MG capsule TAKE ONE CAPSULE BY MOUTH ONCE DAILY 30 capsule 1   oxyCODONE-acetaminophen (PERCOCET) 5-325 MG tablet Take 1 tablet by mouth every 4 (four) hours as needed for severe pain. 30 tablet 0   vitamin E 200 UNIT capsule Take 200 Units by mouth daily.     carvedilol (COREG) 6.25 MG tablet Take 1 tablet (6.25 mg total) by mouth 2 (two) times daily. 180 tablet 3   AURYXIA 1 GM 210 MG(Fe) tablet Take 420-840 mg by mouth with breakfast, with lunch, and with evening meal. Taking  2 tablets daily (420mg ) with each snack     Nutritional Supplements (,FEEDING SUPPLEMENT, PROSOURCE PLUS) liquid Take 30 mLs by mouth 2 (two) times daily between meals. 60 mL 0   No facility-administered medications prior to visit.   Final Medications at End of Visit    Current Meds  Medication Sig   acetaminophen (TYLENOL) 500 MG tablet Take 1,000 mg by mouth 2 (two) times daily as needed for moderate pain or headache.   albuterol (VENTOLIN HFA) 108 (90 Base) MCG/ACT inhaler Inhale 2 puffs into the lungs every 6 (six) hours as needed for wheezing or shortness of breath.   Ascorbic Acid (VITAMIN C) 100 MG tablet Take 100 mg by mouth daily.   atorvastatin (LIPITOR) 10 MG tablet TAKE ONE TABLET BY MOUTH ONCE DAILY   Biotin 10000 MCG TABS Take 10,000 mcg by mouth daily.    cyclobenzaprine (FLEXERIL) 10 MG tablet Take 1 tablet (10 mg total) by mouth 3 (three) times daily as needed for muscle spasms.   ELIQUIS 2.5 MG TABS tablet TAKE ONE TABLET BY MOUTH TWICE DAILY   ezetimibe (ZETIA) 10 MG tablet Take 1 tablet (10 mg total) by mouth daily.    lidocaine (LIDODERM) 5 % Place 1 patch onto the skin daily as needed. Remove & Discard patch within 12 hours or as directed by MD   meclizine (ANTIVERT) 12.5 MG tablet Take 1 tablet (12.5 mg total) by mouth 3 (three) times daily as needed for dizziness.   multivitamin (RENA-VIT) TABS tablet Take 1 tablet by mouth at bedtime.   mupirocin ointment (BACTROBAN) 2 % Apply 1 application topically daily as needed (skin bumps).   omeprazole (PRILOSEC) 20 MG capsule TAKE ONE CAPSULE BY MOUTH ONCE DAILY   oxyCODONE-acetaminophen (PERCOCET) 5-325 MG tablet Take 1 tablet by mouth every 4 (four) hours as needed for severe pain.   vitamin E 200 UNIT capsule Take 200 Units by mouth daily.   [DISCONTINUED] carvedilol (COREG) 6.25 MG tablet Take 1 tablet (6.25 mg total) by mouth 2 (two) times daily.   Radiology:  No results found.  Cardiac Studies:   Echocardiogram 07/17/2021:  1. Left ventricular ejection fraction, by estimation, is 70 to 75%. The  left ventricle has hyperdynamic function. The left ventricle has no  regional wall motion abnormalities. Left ventricular diastolic parameters  are consistent with Grade I diastolic  dysfunction (impaired relaxation). Elevated left atrial pressure.   2. Right ventricular systolic function is normal. The right ventricular  size is normal. The estimated right ventricular systolic pressure is 46.6  mmHg.   3. Left atrial size was mildly dilated.   4. The mitral valve is normal in structure. No evidence of mitral valve  regurgitation. Moderate mitral annular calcification.   5. The aortic valve is tricuspid. There is mild calcification of the  aortic valve. There is mild thickening of the aortic valve. Aortic valve  regurgitation is not visualized. Mild to moderate aortic valve  sclerosis/calcification is present, without any  evidence of aortic stenosis. Aortic valve mean gradient measures 14.0  mmHg. Aortic valve Vmax measures 2.52 m/s.   6. The inferior  vena cava is normal in size with greater than 50%  respiratory variability, suggesting right atrial pressure of 3 mmHg.   Carotid artery duplex 05/08/2021:  No flow noted in the right ICA (Total occlusion).  Stenosis in the left internal carotid artery (16-49%). Stenosis in the  left external carotid artery (<50%).  Antegrade right vertebral artery flow. Antegrade left vertebral artery  flow.  Compared to the study done on 11/16/2020, right ACA occlusion is new.    Previously there was no significant disease. Follow up in six months is  appropriate if clinically indicated.  (patient was brought back next day to confirm occlusion, patient  asymptomatic, occlusion confirmed, will arrange OV).  Left heart catheterization 05/01/2021: Moderate disease in the right coronary artery and circumflex coronary artery with diffuse coronary calcification, minimal disease in the LAD.  Normal LV systolic function and normal LVEDP.  No pressure gradient across aortic valve.   Recommendation: Evaluation for noncardiac causes of chest pain is indicated.  30 - 40 mL contrast utilized.  Right femoral arterial access closed with minx. Anticoagulation with Eliquis for hypercoagulable state.  No aspirin although she has moderate coronary disease in view of increased risk of bleeding.  PCV MYOCARDIAL PERFUSION WO LEXISCAN 11/20/2020 Lexiscan not administered at patient's request. Incomplete study. EKG tracing not available for review. Rest images show mild decrease in tracer update in basal inferior myocardium. Consider alternate ischemia studies, if clinically indicated.  Carotid artery duplex 11/16/2020:  Stenosis in the right internal carotid artery (1-15%).  Stenosis in the left internal carotid artery (50-69%).  Antegrade right vertebral artery flow. Antegrade left vertebral artery flow.  Follow up in six months is appropriate if clinically indicated.  Nuclear stress test Lexiscan Myoview  08/05/2013: Resting EKG NSR, poor R wave progression.  Stress EKG was nondiagnostic for ischemia.  No ST-T changes of ischemia noted with pharmacologic stress testing.  Stress symptoms included shortness of breath and nausea.  Stress terminated due to completion of protocol. The perfusion study demonstrated normal isotope uptake both at rest and stress.  There was no evidence of ischemia or scar.  Dynamic gated images revealed normal wall motion and endocardial thickening.  Left ventricular ejection fraction estimated to be 75%.  No significant change from 01/03/2012.    CT neck 10/31/2011: Normal soft tissues.  Mild pulmonary artery dilation suggest hypertension.  Sleep study 2007: Sleep apnea, noncompliant with CPAP uses supplemental oxygen only.  EKG:  09/13/2021: Sinus rhythm with first-degree AV block at a rate of 84 bpm.  Left atrial enlargement.  Left axis.  Incomplete right bundle branch block.  Nonspecific T wave abnormality.  03/01/2021: Sinus rhythm at a rate of 96 bpm with first-degree AV block.  Biatrial enlargement.  Left axis, left anterior fascicular block.  T wave abnormality, cannot exclude anterolateral ischemia.  PR depression, suggestive of pericarditis.  11/14/2020: Sinus rhythm at a rate of 82 bpm with first-degree AV block, biatrial enlargement.  Left axis deviation, left anterior fascicular block.  Poor R wave progression, cannot exclude anteroseptal infarct old.  Cannot exclude inferior ischemia.  Incomplete right bundle branch block.  Compared to EKG 10/30/2020 (external), no significant change.  12/03/2018: Sinus tachycardia at 113 beats per minute with first-degree AV block.  Normal axis.  PR WP cannot exclude anterior infarct old.  No evidence of ischemia.  Except for tachycardia no changes noted to EKG.  Assessment     ICD-10-CM   1. Coronary artery disease involving native coronary artery of native heart without angina pectoris  I25.10     2. Chronic diastolic heart  failure (HCC)  I50.32     3. Essential hypertension  I10      Medications Discontinued During This Encounter  Medication Reason   Nutritional Supplements (,FEEDING SUPPLEMENT, PROSOURCE PLUS) liquid    carvedilol (COREG) 6.25 MG tablet Reorder    Meds ordered this encounter  Medications   carvedilol (COREG) 12.5 MG tablet    Sig: Take 1 tablet (12.5 mg total) by mouth 2 (two) times daily.    Dispense:  180 tablet    Refill:  3   ezetimibe (ZETIA) 10 MG tablet    Sig: Take 1 tablet (10 mg total) by mouth daily.    Dispense:  30 tablet    Refill:  3   Recommendations:   Yamilka Lopiccolo is a 65 y.o. AAfrican-American female with history of obesity, hypertension, hyperlipidemia, tobacco use, stage IV kidney disease on dialysis, obstructive sleep apnea not on CPAP, COPD and severe peripheral arterial disease with history of left femoral-popliteal bypass surgery by Dr. Victorino Dike in 2014.  Patient is on chronic anticoagulation with Eliquis due to recurrent DVT and PE in 2014 and 2016.  Patient is followed by hematology for hypercoagulable state.  She is currently undergoing hemodialysis on a Monday/Wednesday/Friday schedule.  Patient presents for 8-week follow-up of hyperlipidemia and hypertension.  Last office visit resumed carvedilol 6.25 mg twice daily given uncontrolled hypertension and elevated heart rate.  Unfortunately previously ordered BNP and lipid profile testing have not been done.  At last office visit recommended additional lipid management therapy, however patient was resistant at the time.  Patient has now been to trying Zetia 10 mg daily.  She will monitor for side effects and notify our staff if she has any issues.  We will otherwise plan to repeat lipid profile testing in 3 months.  Again encourage patient regarding diet and lifestyle modifications including weight loss.  [name] heart rate remains elevated, will therefore increase carvedilol from 6.25mg  to 12.5 mg p.o.  twice daily.  Patient is euvolemic on exam today. Patient would also be a good candidate for Repatha or Praluent, however she is very resistant to the idea of injectable medications. Patient is notably not on ACE inhibitor or ARB due to renal disease.  Patient has had no recurrence of chest pain and palpitations have essentially resolved.  Follow-up in 3 months, sooner if  needed, for hyperlipidemia, hypertension, tachycardia.   Alethia Berthold, PA-C 11/27/2021, 1:38 PM Office: [phone]

## 2021-11-27 ENCOUNTER — Telehealth: Payer: Self-pay

## 2021-11-27 ENCOUNTER — Encounter: Payer: Self-pay | Admitting: Student

## 2021-11-27 ENCOUNTER — Ambulatory Visit: Payer: Medicare Other | Admitting: Student

## 2021-11-27 ENCOUNTER — Other Ambulatory Visit: Payer: Self-pay

## 2021-11-27 ENCOUNTER — Other Ambulatory Visit: Payer: Self-pay | Admitting: Hematology

## 2021-11-27 VITALS — BP 125/59 | HR 102 | Resp 16 | Ht 61.0 in | Wt 202.4 lb

## 2021-11-27 DIAGNOSIS — I2510 Atherosclerotic heart disease of native coronary artery without angina pectoris: Secondary | ICD-10-CM | POA: Diagnosis not present

## 2021-11-27 DIAGNOSIS — I10 Essential (primary) hypertension: Secondary | ICD-10-CM

## 2021-11-27 DIAGNOSIS — I5032 Chronic diastolic (congestive) heart failure: Secondary | ICD-10-CM | POA: Diagnosis not present

## 2021-11-27 DIAGNOSIS — K219 Gastro-esophageal reflux disease without esophagitis: Secondary | ICD-10-CM

## 2021-11-27 MED ORDER — EZETIMIBE 10 MG PO TABS: 10.0000 mg | ORAL_TABLET | Freq: Every day | ORAL | 3 refills | Status: DC

## 2021-11-27 MED ORDER — CARVEDILOL 12.5 MG PO TABS: 12.5000 mg | ORAL_TABLET | Freq: Two times a day (BID) | ORAL | 3 refills | Status: DC

## 2021-11-27 NOTE — Patient Instructions (Signed)
Mediterranean Diet °A Mediterranean diet refers to food and lifestyle choices that are based on the traditions of countries located on the [place]. It focuses on eating more fruits, vegetables, whole grains, beans, nuts, seeds, and heart-healthy fats, and eating less dairy, meat, eggs, and processed foods with added sugar, salt, and fat. This way of eating has been shown to help prevent certain conditions and improve outcomes for people who have chronic diseases, like kidney disease and heart disease. °What are tips for following this plan? °Reading food labels °Check the serving size of packaged foods. For foods such as rice and pasta, the serving size refers to the amount of cooked product, not dry. °Check the total fat in packaged foods. Avoid foods that have saturated fat or trans fats. °Check the ingredient list for added sugars, such as corn syrup. °Shopping ° °Buy a variety of foods that offer a balanced diet, including: °Fresh fruits and vegetables (produce). °Grains, beans, nuts, and seeds. Some of these may be available in unpackaged forms or large amounts (in bulk). °Fresh seafood. °Poultry and eggs. °Low-fat dairy products. °Buy whole ingredients instead of prepackaged foods. °Buy fresh fruits and vegetables in-season from local farmers markets. °Buy plain frozen fruits and vegetables. °If you do not have access to quality fresh seafood, buy precooked frozen shrimp or canned fish, such as tuna, salmon, or sardines. °Stock your pantry so you always have certain foods on hand, such as olive oil, canned tuna, canned tomatoes, rice, pasta, and beans. °Cooking °Cook foods with extra-virgin olive oil instead of using butter or other vegetable oils. °Have meat as a side dish, and have vegetables or grains as your main dish. This means having meat in small portions or adding small amounts of meat to foods like pasta or stew. °Use beans or vegetables instead of meat in common dishes like chili or  lasagna. °Experiment with different cooking methods. Try roasting, broiling, steaming, and sautéing vegetables. °Add frozen vegetables to soups, stews, pasta, or rice. °Add nuts or seeds for added healthy fats and plant protein at each meal. You can add these to yogurt, salads, or vegetable dishes. °Marinate fish or vegetables using olive oil, lemon juice, garlic, and fresh herbs. °Meal planning °Plan to eat one vegetarian meal one day each week. Try to work up to two vegetarian meals, if possible. °Eat seafood two or more times a week. °Have healthy snacks readily available, such as: °Vegetable sticks with hummus. °Greek yogurt. °Fruit and nut trail mix. °Eat balanced meals throughout the week. This includes: °Fruit: 2-3 servings a day. °Vegetables: 4-5 servings a day. °Low-fat dairy: 2 servings a day. °Fish, poultry, or lean meat: 1 serving a day. °Beans and legumes: 2 or more servings a week. °Nuts and seeds: 1-2 servings a day. °Whole grains: 6-8 servings a day. °Extra-virgin olive oil: 3-4 servings a day. °Limit red meat and sweets to only a few servings a month. °Lifestyle ° °Cook and eat meals together with your family, when possible. °Drink enough fluid to keep your urine pale yellow. °Be physically active every day. This includes: °Aerobic exercise like running or swimming. °Leisure activities like gardening, walking, or housework. °Get 7-8 hours of sleep each night. °If recommended by your health care provider, drink red wine in moderation. This means 1 glass a day for nonpregnant women and 2 glasses a day for men. A glass of wine equals 5 oz (150 mL). °What foods should I eat? °Fruits °Apples. Apricots. Avocado. Berries. Bananas. Cherries. Dates.   Figs. Grapes. Lemons. Melon. Oranges. Peaches. Plums. Pomegranate. °Vegetables °Artichokes. Beets. Broccoli. Cabbage. Carrots. Eggplant. Green beans. Chard. Kale. Spinach. Onions. Leeks. Peas. Squash. Tomatoes. Peppers. Radishes. °Grains °Whole-grain pasta. Brown  rice. Bulgur wheat. Polenta. Couscous. Whole-wheat bread. Oatmeal. Quinoa. °Meats and other proteins °Beans. Almonds. Sunflower seeds. Pine nuts. Peanuts. Cod. Salmon. Scallops. Shrimp. Tuna. Tilapia. Clams. Oysters. Eggs. Poultry without skin. °Dairy °Low-fat milk. Cheese. Greek yogurt. °Fats and oils °Extra-virgin olive oil. Avocado oil. Grapeseed oil. °Beverages °Water. Red wine. Herbal tea. °Sweets and desserts °Greek yogurt with honey. Baked apples. Poached pears. Trail mix. °Seasonings and condiments °Basil. Cilantro. Coriander. Cumin. Mint. Parsley. Sage. Rosemary. Tarragon. Garlic. Oregano. Thyme. Pepper. Balsamic vinegar. Tahini. Hummus. Tomato sauce. Olives. Mushrooms. °The items listed above may not be a complete list of foods and beverages you can eat. Contact a dietitian for more information. °What foods should I limit? °This is a list of foods that should be eaten rarely or only on special occasions. °Fruits °Fruit canned in syrup. °Vegetables °Deep-fried potatoes (french fries). °Grains °Prepackaged pasta or rice dishes. Prepackaged cereal with added sugar. Prepackaged snacks with added sugar. °Meats and other proteins °Beef. Pork. Lamb. Poultry with skin. Hot dogs. Bacon. °Dairy °Ice cream. Sour cream. Whole milk. °Fats and oils °Butter. Canola oil. Vegetable oil. Beef fat (tallow). Lard. °Beverages °Juice. Sugar-sweetened soft drinks. Beer. Liquor and spirits. °Sweets and desserts °Cookies. Cakes. Pies. Candy. °Seasonings and condiments °Mayonnaise. Pre-made sauces and marinades. °The items listed above may not be a complete list of foods and beverages you should limit. Contact a dietitian for more information. °Summary °The Mediterranean diet includes both food and lifestyle choices. °Eat a variety of fresh fruits and vegetables, beans, nuts, seeds, and whole grains. °Limit the amount of red meat and sweets that you eat. °If recommended by your health care provider, drink red wine in moderation.  This means 1 glass a day for nonpregnant women and 2 glasses a day for men. A glass of wine equals 5 oz (150 mL). °This information is not intended to replace advice given to you by your health care provider. Make sure you discuss any questions you have with your health care provider. °Document Revised: 01/21/2020 Document Reviewed: 11/18/2019 °Elsevier Patient Education © 2022 [employer]. ° °

## 2021-11-27 NOTE — Chronic Care Management (AMB) (Signed)
Chronic Care Management Pharmacy Assistant   Name: Whitney Walker  MRN: [id number] DOB: Jul 08, 1956   Reason for Encounter: Medication Review/ Medication coordination  Recent office visits:  11-15-2021 Whitney Walker, [place]. Creatinine= 6.96, eGFR= 6, BUN/Creatinine= 4, Potassium= 5.3, CO2= 18. A1C= 5.9. Vit D= 9.0. START Flexeril 10 mg 3 times daily as needed.  Recent consult visits:  11-13-2021 Whitney Killings, MD (Orthopedic surgery). Referral placed to home health.  Hospital visits:  Medication Reconciliation was completed by comparing discharge summary, patient's EMR and Pharmacy list, and upon discussion with patient.  Admitted to the hospital on 07-16-2021 due to sepsis. Discharge date was 07-22-2021. Discharged from [place]?Medications Started at [facility] Discharge:?? Cefazolin inject 2 grams into vein in Monday, Wednesday and Friday with dialysis. Colace 100 mg twice daily as needed Aspirin 81 mg daily Lidoderm 5% one patch onto skin daily as needed. Discard patch after 12 hours Melatonin 3 mg at bedtime as needed Miralax 17 g as needed for constipation ProSource Plus  Medication Changes at Hospital Discharge: None  Medications Discontinued at Hospital Discharge: Amlodipine 5 mg Carvedilol 12.5 mg Nystatin powder Prednisone 10 mg  Medications that remain the same after Hospital Discharge:??  -All other medications will remain the same.    Medications: Outpatient Encounter Medications as of 11/27/2021  Medication Sig   acetaminophen (TYLENOL) 500 MG tablet Take 1,000 mg by mouth 2 (two) times daily as needed for moderate pain or headache.   albuterol (VENTOLIN HFA) 108 (90 Base) MCG/ACT inhaler Inhale 2 puffs into the lungs every 6 (six) hours as needed for wheezing or shortness of breath.   Ascorbic Acid (VITAMIN C) 100 MG tablet Take 100 mg by mouth daily.   atorvastatin (LIPITOR) 10 MG tablet TAKE ONE TABLET BY MOUTH ONCE DAILY   AURYXIA  1 GM 210 MG(Fe) tablet Take 420-840 mg by mouth with breakfast, with lunch, and with evening meal. Taking  2 tablets daily (431m) with each snack   Biotin 10000 MCG TABS Take 10,000 mcg by mouth daily.    carvedilol (COREG) 6.25 MG tablet Take 1 tablet (6.25 mg total) by mouth 2 (two) times daily.   cyclobenzaprine (FLEXERIL) 10 MG tablet Take 1 tablet (10 mg total) by mouth 3 (three) times daily as needed for muscle spasms.   ELIQUIS 2.5 MG TABS tablet TAKE ONE TABLET BY MOUTH TWICE DAILY   lidocaine (LIDODERM) 5 % Place 1 patch onto the skin daily as needed. Remove & Discard patch within 12 hours or as directed by MD   meclizine (ANTIVERT) 12.5 MG tablet Take 1 tablet (12.5 mg total) by mouth 3 (three) times daily as needed for dizziness.   multivitamin (RENA-VIT) TABS tablet Take 1 tablet by mouth at bedtime.   mupirocin ointment (BACTROBAN) 2 % Apply 1 application topically daily as needed (skin bumps).   Nutritional Supplements (,FEEDING SUPPLEMENT, PROSOURCE PLUS) liquid Take 30 mLs by mouth 2 (two) times daily between meals.   omeprazole (PRILOSEC) 20 MG capsule TAKE ONE CAPSULE BY MOUTH ONCE DAILY   oxyCODONE-acetaminophen (PERCOCET) 5-325 MG tablet Take 1 tablet by mouth every 4 (four) hours as needed for severe pain.   vitamin E 200 UNIT capsule Take 200 Units by mouth daily.   No facility-administered encounter medications on file as of 11/27/2021.  Reviewed chart for medication changes ahead of medication coordination call.  No OVs, Consults, or hospital visits since last care coordination call/Pharmacist visit. (If appropriate, list visit  date, provider name)  No medication changes indicated OR if recent visit, treatment plan here.  BP Readings from Last 3 Encounters:  11/15/21 110/62  11/13/21 116/75  10/09/21 (!) 177/104    Lab Results  Component Value Date   HGBA1C 5.9 (H) 11/15/2021     Patient obtains medications through Vials  90 Days   Last adherence delivery  included:  Atorvastatin 10 mg daily Prilosec 20 mg daily Eliquis 2.5 mg twice daily  Patient declined (meds) last month: None  Patient is due for next adherence delivery on: 12-10-2021  Called patient and reviewed medications and coordinated delivery.  This delivery to include: Atorvastatin 10 mg daily Prilosec 20 mg daily Eliquis 2.5 mg twice daily Flexeril 10 mg 3 times daily as needed Carvedilol 12.5 mg twice daily  Coordinated acute fill for:  Ezetimibe 10 mg daily  Patient declined the following medications: None  Patient needs refills for: Prilosec  Confirmed delivery date of 12-10-2021 advised patient that pharmacy will contact them the morning of delivery.   Care Gaps: Shingrix overdue Covid booster overdue Flu vaccine overdue  Star Rating Drugs: Atorvastatin 20 mg- Last filled 11-09-2021 30 [facility]  [place] Clinical Pharmacist Assistant [phone]

## 2021-11-27 NOTE — Telephone Encounter (Signed)
Refilled 11/28 by Dr. Burr Medico. Gardiner Rhyme, RN

## 2021-11-28 DIAGNOSIS — N2581 Secondary hyperparathyroidism of renal origin: Secondary | ICD-10-CM | POA: Diagnosis not present

## 2021-11-28 DIAGNOSIS — E875 Hyperkalemia: Secondary | ICD-10-CM | POA: Diagnosis not present

## 2021-11-28 DIAGNOSIS — N186 End stage renal disease: Secondary | ICD-10-CM | POA: Diagnosis not present

## 2021-11-28 DIAGNOSIS — D689 Coagulation defect, unspecified: Secondary | ICD-10-CM | POA: Diagnosis not present

## 2021-11-28 DIAGNOSIS — Z992 Dependence on renal dialysis: Secondary | ICD-10-CM | POA: Diagnosis not present

## 2021-11-28 DIAGNOSIS — I129 Hypertensive chronic kidney disease with stage 1 through stage 4 chronic kidney disease, or unspecified chronic kidney disease: Secondary | ICD-10-CM | POA: Diagnosis not present

## 2021-11-30 DIAGNOSIS — D689 Coagulation defect, unspecified: Secondary | ICD-10-CM | POA: Diagnosis not present

## 2021-11-30 DIAGNOSIS — E875 Hyperkalemia: Secondary | ICD-10-CM | POA: Diagnosis not present

## 2021-11-30 DIAGNOSIS — N186 End stage renal disease: Secondary | ICD-10-CM | POA: Diagnosis not present

## 2021-11-30 DIAGNOSIS — N2581 Secondary hyperparathyroidism of renal origin: Secondary | ICD-10-CM | POA: Diagnosis not present

## 2021-11-30 DIAGNOSIS — Z992 Dependence on renal dialysis: Secondary | ICD-10-CM | POA: Diagnosis not present

## 2021-12-02 NOTE — Progress Notes (Signed)
Can we fax her results to her dialysis center please

## 2021-12-03 DIAGNOSIS — Z992 Dependence on renal dialysis: Secondary | ICD-10-CM | POA: Diagnosis not present

## 2021-12-03 DIAGNOSIS — D689 Coagulation defect, unspecified: Secondary | ICD-10-CM | POA: Diagnosis not present

## 2021-12-03 DIAGNOSIS — N186 End stage renal disease: Secondary | ICD-10-CM | POA: Diagnosis not present

## 2021-12-03 DIAGNOSIS — E875 Hyperkalemia: Secondary | ICD-10-CM | POA: Diagnosis not present

## 2021-12-03 DIAGNOSIS — N2581 Secondary hyperparathyroidism of renal origin: Secondary | ICD-10-CM | POA: Diagnosis not present

## 2021-12-04 ENCOUNTER — Other Ambulatory Visit: Payer: Self-pay

## 2021-12-04 ENCOUNTER — Other Ambulatory Visit: Payer: Self-pay | Admitting: Hematology

## 2021-12-04 DIAGNOSIS — K219 Gastro-esophageal reflux disease without esophagitis: Secondary | ICD-10-CM

## 2021-12-04 MED ORDER — OMEPRAZOLE 20 MG PO CPDR: 20.0000 mg | DELAYED_RELEASE_CAPSULE | Freq: Every day | ORAL | 0 refills | Status: DC

## 2021-12-05 DIAGNOSIS — N186 End stage renal disease: Secondary | ICD-10-CM | POA: Diagnosis not present

## 2021-12-05 DIAGNOSIS — D689 Coagulation defect, unspecified: Secondary | ICD-10-CM | POA: Diagnosis not present

## 2021-12-05 DIAGNOSIS — E875 Hyperkalemia: Secondary | ICD-10-CM | POA: Diagnosis not present

## 2021-12-05 DIAGNOSIS — Z992 Dependence on renal dialysis: Secondary | ICD-10-CM | POA: Diagnosis not present

## 2021-12-05 DIAGNOSIS — N2581 Secondary hyperparathyroidism of renal origin: Secondary | ICD-10-CM | POA: Diagnosis not present

## 2021-12-06 ENCOUNTER — Telehealth: Payer: Self-pay

## 2021-12-06 NOTE — Chronic Care Management (AMB) (Signed)
    Chronic Care Management Pharmacy Assistant   Name: Whitney Walker  MRN: [id number] DOB: [birth year]  Reason for Encounter: Medication Review/ Medication coordination.    Medications: Outpatient Encounter Medications as of 12/06/2021  Medication Sig   acetaminophen (TYLENOL) 500 MG tablet Take 1,000 mg by mouth 2 (two) times daily as needed for moderate pain or headache.   albuterol (VENTOLIN HFA) 108 (90 Base) MCG/ACT inhaler Inhale 2 puffs into the lungs every 6 (six) hours as needed for wheezing or shortness of breath.   Ascorbic Acid (VITAMIN C) 100 MG tablet Take 100 mg by mouth daily.   atorvastatin (LIPITOR) 10 MG tablet TAKE ONE TABLET BY MOUTH ONCE DAILY   AURYXIA 1 GM 210 MG(Fe) tablet Take 420-840 mg by mouth with breakfast, with lunch, and with evening meal. Taking  2 tablets daily (420mg ) with each snack   Biotin 10000 MCG TABS Take 10,000 mcg by mouth daily.    carvedilol (COREG) 12.5 MG tablet Take 1 tablet (12.5 mg total) by mouth 2 (two) times daily.   cyclobenzaprine (FLEXERIL) 10 MG tablet Take 1 tablet (10 mg total) by mouth 3 (three) times daily as needed for muscle spasms.   ELIQUIS 2.5 MG TABS tablet TAKE ONE TABLET BY MOUTH TWICE DAILY   ezetimibe (ZETIA) 10 MG tablet Take 1 tablet (10 mg total) by mouth daily.   lidocaine (LIDODERM) 5 % Place 1 patch onto the skin daily as needed. Remove & Discard patch within 12 hours or as directed by MD   meclizine (ANTIVERT) 12.5 MG tablet Take 1 tablet (12.5 mg total) by mouth 3 (three) times daily as needed for dizziness.   multivitamin (RENA-VIT) TABS tablet Take 1 tablet by mouth at bedtime.   mupirocin ointment (BACTROBAN) 2 % Apply 1 application topically daily as needed (skin bumps).   omeprazole (PRILOSEC) 20 MG capsule Take 1 capsule (20 mg total) by mouth daily.   oxyCODONE-acetaminophen (PERCOCET) 5-325 MG tablet Take 1 tablet by mouth every 4 (four) hours as needed for severe pain.   vitamin E 200 UNIT capsule  Take 200 Units by mouth daily.   No facility-administered encounter medications on file as of 12/06/2021.    12-06-2021: Received a message from pharmacist Stanton about patient declining all medications for delivery on 12-12 after confirming on 11-29. Patient stated she has plenty of supply of all medications and will only need Zetia at the moment. Patient stated she is doubling up on the old prescription of carvedilol per cardiologist and has a new bottle of the new prescription. Patient will call when medications are running low. Updated [place].  [place] Pharmacist Assistant [phone]

## 2021-12-07 DIAGNOSIS — N2581 Secondary hyperparathyroidism of renal origin: Secondary | ICD-10-CM | POA: Diagnosis not present

## 2021-12-07 DIAGNOSIS — N186 End stage renal disease: Secondary | ICD-10-CM | POA: Diagnosis not present

## 2021-12-07 DIAGNOSIS — Z992 Dependence on renal dialysis: Secondary | ICD-10-CM | POA: Diagnosis not present

## 2021-12-07 DIAGNOSIS — E875 Hyperkalemia: Secondary | ICD-10-CM | POA: Diagnosis not present

## 2021-12-07 DIAGNOSIS — D689 Coagulation defect, unspecified: Secondary | ICD-10-CM | POA: Diagnosis not present

## 2021-12-10 DIAGNOSIS — E875 Hyperkalemia: Secondary | ICD-10-CM | POA: Diagnosis not present

## 2021-12-10 DIAGNOSIS — Z992 Dependence on renal dialysis: Secondary | ICD-10-CM | POA: Diagnosis not present

## 2021-12-10 DIAGNOSIS — D689 Coagulation defect, unspecified: Secondary | ICD-10-CM | POA: Diagnosis not present

## 2021-12-10 DIAGNOSIS — N186 End stage renal disease: Secondary | ICD-10-CM | POA: Diagnosis not present

## 2021-12-10 DIAGNOSIS — N2581 Secondary hyperparathyroidism of renal origin: Secondary | ICD-10-CM | POA: Diagnosis not present

## 2021-12-11 ENCOUNTER — Ambulatory Visit (INDEPENDENT_AMBULATORY_CARE_PROVIDER_SITE_OTHER): Payer: Medicare Other

## 2021-12-11 ENCOUNTER — Telehealth: Payer: Medicare Other

## 2021-12-11 DIAGNOSIS — N186 End stage renal disease: Secondary | ICD-10-CM

## 2021-12-11 DIAGNOSIS — E875 Hyperkalemia: Secondary | ICD-10-CM

## 2021-12-11 DIAGNOSIS — I5032 Chronic diastolic (congestive) heart failure: Secondary | ICD-10-CM

## 2021-12-11 DIAGNOSIS — I10 Essential (primary) hypertension: Secondary | ICD-10-CM

## 2021-12-11 DIAGNOSIS — E559 Vitamin D deficiency, unspecified: Secondary | ICD-10-CM

## 2021-12-11 DIAGNOSIS — I700 Atherosclerosis of aorta: Secondary | ICD-10-CM

## 2021-12-12 DIAGNOSIS — N186 End stage renal disease: Secondary | ICD-10-CM | POA: Diagnosis not present

## 2021-12-12 DIAGNOSIS — E875 Hyperkalemia: Secondary | ICD-10-CM | POA: Diagnosis not present

## 2021-12-12 DIAGNOSIS — N2581 Secondary hyperparathyroidism of renal origin: Secondary | ICD-10-CM | POA: Diagnosis not present

## 2021-12-12 DIAGNOSIS — Z992 Dependence on renal dialysis: Secondary | ICD-10-CM | POA: Diagnosis not present

## 2021-12-12 DIAGNOSIS — D689 Coagulation defect, unspecified: Secondary | ICD-10-CM | POA: Diagnosis not present

## 2021-12-12 NOTE — Patient Instructions (Signed)
Visit Information   Thank you for taking time to visit with me today. Please don't hesitate to contact me if I can be of assistance to you before our next scheduled telephone appointment.  Following are the goals we discussed today:  (Copy and paste patient goals from clinical care plan here)  Our next appointment is by telephone on 02/11/22 at 11:50 AM  Please call the care guide team at [phone] if you need to cancel or reschedule your appointment.   If you are experiencing a Mental Health or [place] or need someone to talk to, please call 1-800-273-TALK (toll free, 24 hour hotline)   Following is a copy of your full care plan:  Care Plan : [facility] of Care  Updates made by [name], RN since 12/11/2021 12:00 AM     Problem: No plan established for management of chronic disease states (ESRD, HTN, CHF, Vitamin D deficiency, Hyperkalemia, Aortic Atherosclerosis , HLD, Vitamin D deficiency)   Priority: High     Long-Range Goal: Development of plan of care for chronic disease management for (ESRD, HTN, CHF, Vitamin D deficiency, Hyperkalemia, Aortic Atherosclerosis , HLD, Vitamin D deficiency)   Start Date: 12/11/2021  Expected End Date: 12/11/2022  This Visit's Progress: On track  Priority: High  Note:   Current Barriers:  Knowledge Deficits related to plan of care for management of ESRD, HTN, CHF, Vitamin D deficiency, Hyperkalemia, Aortic Atherosclerosis , HLD, Vitamin D deficiency    Chronic Disease Management support and education needs related to ESRD, HTN, CHF, Vitamin D deficiency, Hyperkalemia, Aortic Atherosclerosis , HLD, Vitamin D deficiency     RNCM Clinical Goal(s):  Patient will verbalize basic understanding of  ESRD, HTN, CHF, Vitamin D deficiency, Hyperkalemia, Aortic Atherosclerosis , HLD, Vitamin D deficiency   disease process and self health management plan as evidenced by patient will report having no disease exacerbations  related to her chronic disease states as listed above take all medications exactly as prescribed and will call provider for medication related questions as evidenced by patient will report having no missed doses of her prescribed medications  demonstrate Ongoing health management independence as evidenced by patient will report 100% adherence to her prescribed treatment plan  continue to work with RN Care Manager to address care management and care coordination needs related to  ESRD, HTN, CHF, Vitamin D deficiency, Hyperkalemia, Aortic Atherosclerosis , HLD, Vitamin D deficiency   as evidenced by adherence to CM Team Scheduled appointments demonstrate ongoing self health care management ability   as evidenced by    through collaboration with RN Care manager, provider, and care team.   Interventions: 1:1 collaboration with primary care provider regarding development and update of comprehensive plan of care as evidenced by provider attestation and co-signature Inter-disciplinary care team collaboration (see longitudinal plan of care) Evaluation of current treatment plan related to  self management and patient's adherence to plan as established by provider  CAD Interventions: (Status:  Condition stable.  Not addressed this visit.) Long Term Goal Assessed understanding of CAD diagnosis Medications reviewed including medications utilized in CAD treatment plan Provided education on importance of blood pressure control in management of CAD Provided education on Importance of limiting foods high in cholesterol Counseled on importance of regular laboratory monitoring as prescribed Counseled on the importance of exercise goals with target of 150 minutes per week Reviewed Importance of taking all medications as prescribed Reviewed Importance of attending all scheduled provider appointments Advised to  report any changes in symptoms or exercise tolerance Screening for signs and symptoms of depression  related to chronic disease state Discussed plans with patient for ongoing care management follow up and provided patient with direct contact information for care management team  Vitamin D deficiency:  (Status:  Goal on track:  NO.)  Long Term Goal Evaluation of current treatment plan related to  Vitamin D deficiency , self-management and patient's adherence to plan as established by provider. Review of patient status, including review of consultant's reports, relevant laboratory and other test results, and medications completed. Discussed plans with patient for ongoing care management follow up and provided patient with direct contact information for care management team Vit D, 25-Hydroxy 30.0 - 100.0 ng/mL 9.0 Low   9.1 Low  CM    Pain Interventions:  (Status:  Goal on track:  Yes.) Long Term Goal Pain assessment performed Medications reviewed Reviewed provider established plan for pain management Discussed importance of adherence to all scheduled medical appointments Counseled on the importance of reporting any/all new or changed pain symptoms or management strategies to pain management provider Advised patient to report to care team affect of pain on daily activities Discussed use of relaxation techniques and/or diversional activities to assist with pain reduction (distraction, imagery, relaxation, massage, acupressure, TENS, heat, and cold application Reviewed with patient prescribed pharmacological and nonpharmacological pain relief strategies Printed educational materials related to Sciatica and exercise Discussed plans with patient for ongoing care management follow up and provided patient with direct contact information for care management team  Patient Goals/Self-Care Activities: Take all medications as prescribed Attend all scheduled provider appointments Call pharmacy for medication refills 3-7 days in advance of running out of medications Perform all self care activities  independently  Perform IADL's (shopping, preparing meals, housekeeping, managing finances) independently Call provider office for new concerns or questions   Follow Up Plan:  Telephone follow up appointment with care management team member scheduled for:  02/11/22      Consent to CCM Services: Whitney Walker was given information about Chronic Care Management services including:  CCM service includes personalized support from designated clinical staff supervised by her physician, including individualized plan of care and coordination with other care providers 24/7 contact phone numbers for assistance for urgent and routine care needs. Service will only be billed when office clinical staff spend 20 minutes or more in a month to coordinate care. Only one practitioner may furnish and bill the service in a calendar month. The patient may stop CCM services at any time (effective at the end of the month) by phone call to the office staff. The patient will be responsible for cost sharing (co-pay) of up to 20% of the service fee (after annual deductible is met).  Patient agreed to services and verbal consent obtained.   Patient verbalizes understanding of instructions provided today and agrees to view in [place].   Telephone follow up appointment with care management team member scheduled for:

## 2021-12-12 NOTE — Chronic Care Management (AMB) (Signed)
Chronic Care Management   CCM RN Visit Note  12/11/2021 Name: Whitney Walker MRN: [id number] DOB: [birth year]  Subjective: Whitney Walker is a 65 y.o. year old female who is a primary care patient of [name], [place]. The care management team was consulted for assistance with disease management and care coordination needs.    Engaged with patient by telephone for follow up visit in response to provider referral for case management and/or care coordination services.   Consent to Services:  The patient was given information about Chronic Care Management services, agreed to services, and gave verbal consent prior to initiation of services.  Please see initial visit note for detailed documentation.   Patient agreed to services and verbal consent obtained.   Assessment: Review of patient past medical history, allergies, medications, health status, including review of consultants reports, laboratory and other test data, was performed as part of comprehensive evaluation and provision of chronic care management services.   SDOH (Social Determinants of Health) assessments and interventions performed:    CCM Care Plan  Allergies  Allergen Reactions   Carnosine     Other reaction(s): Unknown   Gadolinium Derivatives Hives and Other (See Comments)    HIVES, Desc: HIVES W/ "DYE" USED FOR 1ST CT SCAN BUT NOT 2ND, NO PREMEDS USED, PT UNCERTAIN OF CIRCUMSTANCES,,?POSSIBLE MRI CONTRAST ALLERGY, ALL STUDIES DONE "SOMEWHERE" IN [place]//A.C., Onset Date: [id number]   Iohexol Other (See Comments)     Code: HIVES, Desc: HIVES W/ "DYE" USED FOR 1ST CT SCAN BUT NOT 2ND, NO PREMEDS USED, PT UNCERTAIN OF CIRCUMSTANCES,,?POSSIBLE MRI CONTRAST ALLERGY, ALL STUDIES DONE "SOMEWHERE" IN [place]//A.C., Onset Date: [id number]    Iodine Hives   Naltrexone     Other reaction(s): Unknown    Outpatient Encounter Medications as of 12/11/2021  Medication Sig   acetaminophen (TYLENOL) 500 MG tablet  Take 1,000 mg by mouth 2 (two) times daily as needed for moderate pain or headache.   albuterol (VENTOLIN HFA) 108 (90 Base) MCG/ACT inhaler Inhale 2 puffs into the lungs every 6 (six) hours as needed for wheezing or shortness of breath.   Ascorbic Acid (VITAMIN C) 100 MG tablet Take 100 mg by mouth daily.   atorvastatin (LIPITOR) 10 MG tablet TAKE ONE TABLET BY MOUTH ONCE DAILY   AURYXIA 1 GM 210 MG(Fe) tablet Take 420-840 mg by mouth with breakfast, with lunch, and with evening meal. Taking  2 tablets daily (420mg ) with each snack   Biotin 10000 MCG TABS Take 10,000 mcg by mouth daily.    carvedilol (COREG) 12.5 MG tablet Take 1 tablet (12.5 mg total) by mouth 2 (two) times daily.   cyclobenzaprine (FLEXERIL) 10 MG tablet Take 1 tablet (10 mg total) by mouth 3 (three) times daily as needed for muscle spasms.   ELIQUIS 2.5 MG TABS tablet TAKE ONE TABLET BY MOUTH TWICE DAILY   ezetimibe (ZETIA) 10 MG tablet Take 1 tablet (10 mg total) by mouth daily.   lidocaine (LIDODERM) 5 % Place 1 patch onto the skin daily as needed. Remove & Discard patch within 12 hours or as directed by MD   meclizine (ANTIVERT) 12.5 MG tablet Take 1 tablet (12.5 mg total) by mouth 3 (three) times daily as needed for dizziness.   multivitamin (RENA-VIT) TABS tablet Take 1 tablet by mouth at bedtime.   mupirocin ointment (BACTROBAN) 2 % Apply 1 application topically daily as needed (skin bumps).   omeprazole (PRILOSEC) 20 MG capsule Take 1 capsule (20 mg total) by  mouth daily.   oxyCODONE-acetaminophen (PERCOCET) 5-325 MG tablet Take 1 tablet by mouth every 4 (four) hours as needed for severe pain.   vitamin E 200 UNIT capsule Take 200 Units by mouth daily.   No facility-administered encounter medications on file as of 12/11/2021.    Patient Active Problem List   Diagnosis Date Noted   Protrusion of lumbar intervertebral disc 10/09/2021   Facet arthritis, degenerative, lumbar spine 10/09/2021   Acute low back pain     MSSA bacteremia    Septic shock ([place]) 07/16/2021   Nausea and vomiting    Abdominal pain    FUO (fever of unknown origin)    Polyarthritis    Polyarthralgia    Hyperkalemia, diminished renal excretion 05/28/2021   Generalized weakness 05/28/2021   Rash and nonspecific skin eruption 05/28/2021   Atypical chest pain    Coronary artery calcification seen on CAT scan    Hyperkalemia 05/09/2020   Left leg pain 05/09/2020   Restless leg syndrome 09/19/2018   OSA (obstructive sleep apnea) 04/29/2018   ESRD (end stage renal disease) ([place]) 04/29/2018   Dyspnea on exertion 03/12/2017   Morbid (severe) obesity due to excess calories ([name]) 03/12/2017   Angina pectoris ([place]) 03/02/2017   Anemia of chronic kidney failure, stage 3 (moderate) ([place]) 12/10/2016   History of colon cancer 08/12/2016   Mass of left breast on mammogram 12/10/2015   Depression 10/18/2015   Numbness and tingling of left arm and leg 01/24/2015   PVD (peripheral vascular disease) with claudication ([place]) 03/23/2014   PAD (peripheral artery disease) ([place]) 12/14/2013   Chronic diastolic heart failure ([place]) 11/15/2013   Essential hypertension 11/15/2013   Leucocytosis 04/28/2012    Conditions to be addressed/monitored:ESRD, HTN, CHF, Vitamin D deficiency, Hyperkalemia, Aortic Atherosclerosis , HLD, Vitamin D deficiency  Care Plan : [place] of Care  Updates made by [name], RN since 12/12/2021 12:00 AM     Problem: No plan established for management of chronic disease states (ESRD, HTN, CHF, Vitamin D deficiency, Hyperkalemia, Aortic Atherosclerosis , HLD, Vitamin D deficiency)   Priority: High     Long-Range Goal: Development of plan of care for chronic disease management for (ESRD, HTN, CHF, Vitamin D deficiency, Hyperkalemia, Aortic Atherosclerosis , HLD, Vitamin D deficiency)   Start Date: 12/11/2021  Expected End Date: 12/11/2022  This Visit's Progress: On track  Priority: High  Note:    Current Barriers:  Knowledge Deficits related to plan of care for management of ESRD, HTN, CHF, Vitamin D deficiency, Hyperkalemia, Aortic Atherosclerosis , HLD, Vitamin D deficiency    Chronic Disease Management support and education needs related to ESRD, HTN, CHF, Vitamin D deficiency, Hyperkalemia, Aortic Atherosclerosis , HLD, Vitamin D deficiency     RNCM Clinical Goal(s):  Patient will verbalize basic understanding of  ESRD, HTN, CHF, Vitamin D deficiency, Hyperkalemia, Aortic Atherosclerosis , HLD, Vitamin D deficiency   disease process and self health management plan as evidenced by patient will report having no disease exacerbations related to her chronic disease states as listed above take all medications exactly as prescribed and will call provider for medication related questions as evidenced by patient will report having no missed doses of her prescribed medications  demonstrate Ongoing health management independence as evidenced by patient will report 100% adherence to her prescribed treatment plan  continue to work with RN Care Manager to address care management and care coordination needs related to  ESRD, HTN, CHF, Vitamin D deficiency,  Hyperkalemia, Aortic Atherosclerosis , HLD, Vitamin D deficiency   as evidenced by adherence to CM Team Scheduled appointments demonstrate ongoing self health care management ability   as evidenced by    through collaboration with RN Care manager, provider, and care team.   Interventions: 1:1 collaboration with primary care provider regarding development and update of comprehensive plan of care as evidenced by provider attestation and co-signature Inter-disciplinary care team collaboration (see longitudinal plan of care) Evaluation of current treatment plan related to  self management and patient's adherence to plan as established by provider  CAD Interventions: (Status:  Condition stable.  Not addressed this visit.) Long Term Goal Assessed  understanding of CAD diagnosis Medications reviewed including medications utilized in CAD treatment plan Provided education on importance of blood pressure control in management of CAD Provided education on Importance of limiting foods high in cholesterol Counseled on importance of regular laboratory monitoring as prescribed Counseled on the importance of exercise goals with target of 150 minutes per week Reviewed Importance of taking all medications as prescribed Reviewed Importance of attending all scheduled provider appointments Advised to report any changes in symptoms or exercise tolerance Screening for signs and symptoms of depression related to chronic disease state Discussed plans with patient for ongoing care management follow up and provided patient with direct contact information for care management team  Vitamin D deficiency:  (Status:  Goal on track:  NO.)  Long Term Goal Evaluation of current treatment plan related to  Vitamin D deficiency , self-management and patient's adherence to plan as established by provider. Review of patient status, including review of consultant's reports, relevant laboratory and other test results, and medications completed. Discussed plans with patient for ongoing care management follow up and provided patient with direct contact information for care management team Vit D, 25-Hydroxy 30.0 - 100.0 ng/mL 9.0 Low   9.1 Low  CM    Pain Interventions:  (Status:  Goal on track:  Yes.) Long Term Goal Pain assessment performed Medications reviewed Reviewed provider established plan for pain management Discussed importance of adherence to all scheduled medical appointments Counseled on the importance of reporting any/all new or changed pain symptoms or management strategies to pain management provider Advised patient to report to care team affect of pain on daily activities Discussed use of relaxation techniques and/or diversional activities to assist with  pain reduction (distraction, imagery, relaxation, massage, acupressure, TENS, heat, and cold application Reviewed with patient prescribed pharmacological and nonpharmacological pain relief strategies Printed educational materials related to Sciatica and exercise Discussed plans with patient for ongoing care management follow up and provided patient with direct contact information for care management team  Patient Goals/Self-Care Activities: Take all medications as prescribed Attend all scheduled provider appointments Call pharmacy for medication refills 3-7 days in advance of running out of medications Perform all self care activities independently  Perform IADL's (shopping, preparing meals, housekeeping, managing finances) independently Call provider office for new concerns or questions   Follow Up Plan:  Telephone follow up appointment with care management team member scheduled for:  02/11/22      Plan:Telephone follow up appointment with care management team member scheduled for:  02/11/22   [name], RN, BSN, CCM Care Management Coordinator [place] [facility]  Direct Phone: [phone]

## 2021-12-14 DIAGNOSIS — Z992 Dependence on renal dialysis: Secondary | ICD-10-CM | POA: Diagnosis not present

## 2021-12-14 DIAGNOSIS — E875 Hyperkalemia: Secondary | ICD-10-CM | POA: Diagnosis not present

## 2021-12-14 DIAGNOSIS — N2581 Secondary hyperparathyroidism of renal origin: Secondary | ICD-10-CM | POA: Diagnosis not present

## 2021-12-14 DIAGNOSIS — D689 Coagulation defect, unspecified: Secondary | ICD-10-CM | POA: Diagnosis not present

## 2021-12-14 DIAGNOSIS — N186 End stage renal disease: Secondary | ICD-10-CM | POA: Diagnosis not present

## 2021-12-17 DIAGNOSIS — E875 Hyperkalemia: Secondary | ICD-10-CM | POA: Diagnosis not present

## 2021-12-17 DIAGNOSIS — D689 Coagulation defect, unspecified: Secondary | ICD-10-CM | POA: Diagnosis not present

## 2021-12-17 DIAGNOSIS — N2581 Secondary hyperparathyroidism of renal origin: Secondary | ICD-10-CM | POA: Diagnosis not present

## 2021-12-17 DIAGNOSIS — N186 End stage renal disease: Secondary | ICD-10-CM | POA: Diagnosis not present

## 2021-12-17 DIAGNOSIS — Z992 Dependence on renal dialysis: Secondary | ICD-10-CM | POA: Diagnosis not present

## 2021-12-19 DIAGNOSIS — Z992 Dependence on renal dialysis: Secondary | ICD-10-CM | POA: Diagnosis not present

## 2021-12-19 DIAGNOSIS — N2581 Secondary hyperparathyroidism of renal origin: Secondary | ICD-10-CM | POA: Diagnosis not present

## 2021-12-19 DIAGNOSIS — E875 Hyperkalemia: Secondary | ICD-10-CM | POA: Diagnosis not present

## 2021-12-19 DIAGNOSIS — D689 Coagulation defect, unspecified: Secondary | ICD-10-CM | POA: Diagnosis not present

## 2021-12-19 DIAGNOSIS — N186 End stage renal disease: Secondary | ICD-10-CM | POA: Diagnosis not present

## 2021-12-21 DIAGNOSIS — Z992 Dependence on renal dialysis: Secondary | ICD-10-CM | POA: Diagnosis not present

## 2021-12-21 DIAGNOSIS — D689 Coagulation defect, unspecified: Secondary | ICD-10-CM | POA: Diagnosis not present

## 2021-12-21 DIAGNOSIS — N2581 Secondary hyperparathyroidism of renal origin: Secondary | ICD-10-CM | POA: Diagnosis not present

## 2021-12-21 DIAGNOSIS — N186 End stage renal disease: Secondary | ICD-10-CM | POA: Diagnosis not present

## 2021-12-21 DIAGNOSIS — E875 Hyperkalemia: Secondary | ICD-10-CM | POA: Diagnosis not present

## 2021-12-24 DIAGNOSIS — N2581 Secondary hyperparathyroidism of renal origin: Secondary | ICD-10-CM | POA: Diagnosis not present

## 2021-12-24 DIAGNOSIS — D689 Coagulation defect, unspecified: Secondary | ICD-10-CM | POA: Diagnosis not present

## 2021-12-24 DIAGNOSIS — E875 Hyperkalemia: Secondary | ICD-10-CM | POA: Diagnosis not present

## 2021-12-24 DIAGNOSIS — N186 End stage renal disease: Secondary | ICD-10-CM | POA: Diagnosis not present

## 2021-12-24 DIAGNOSIS — Z992 Dependence on renal dialysis: Secondary | ICD-10-CM | POA: Diagnosis not present

## 2021-12-26 DIAGNOSIS — N186 End stage renal disease: Secondary | ICD-10-CM | POA: Diagnosis not present

## 2021-12-26 DIAGNOSIS — D689 Coagulation defect, unspecified: Secondary | ICD-10-CM | POA: Diagnosis not present

## 2021-12-26 DIAGNOSIS — E875 Hyperkalemia: Secondary | ICD-10-CM | POA: Diagnosis not present

## 2021-12-26 DIAGNOSIS — N2581 Secondary hyperparathyroidism of renal origin: Secondary | ICD-10-CM | POA: Diagnosis not present

## 2021-12-26 DIAGNOSIS — Z992 Dependence on renal dialysis: Secondary | ICD-10-CM | POA: Diagnosis not present

## 2021-12-28 DIAGNOSIS — E875 Hyperkalemia: Secondary | ICD-10-CM | POA: Diagnosis not present

## 2021-12-28 DIAGNOSIS — D689 Coagulation defect, unspecified: Secondary | ICD-10-CM | POA: Diagnosis not present

## 2021-12-28 DIAGNOSIS — N2581 Secondary hyperparathyroidism of renal origin: Secondary | ICD-10-CM | POA: Diagnosis not present

## 2021-12-28 DIAGNOSIS — N186 End stage renal disease: Secondary | ICD-10-CM | POA: Diagnosis not present

## 2021-12-28 DIAGNOSIS — Z992 Dependence on renal dialysis: Secondary | ICD-10-CM | POA: Diagnosis not present

## 2021-12-29 DIAGNOSIS — N186 End stage renal disease: Secondary | ICD-10-CM | POA: Diagnosis not present

## 2021-12-29 DIAGNOSIS — I10 Essential (primary) hypertension: Secondary | ICD-10-CM

## 2021-12-29 DIAGNOSIS — Z992 Dependence on renal dialysis: Secondary | ICD-10-CM | POA: Diagnosis not present

## 2021-12-29 DIAGNOSIS — I129 Hypertensive chronic kidney disease with stage 1 through stage 4 chronic kidney disease, or unspecified chronic kidney disease: Secondary | ICD-10-CM | POA: Diagnosis not present

## 2021-12-29 DIAGNOSIS — I5032 Chronic diastolic (congestive) heart failure: Secondary | ICD-10-CM

## 2021-12-31 DIAGNOSIS — D689 Coagulation defect, unspecified: Secondary | ICD-10-CM | POA: Diagnosis not present

## 2021-12-31 DIAGNOSIS — N186 End stage renal disease: Secondary | ICD-10-CM | POA: Diagnosis not present

## 2021-12-31 DIAGNOSIS — E875 Hyperkalemia: Secondary | ICD-10-CM | POA: Diagnosis not present

## 2021-12-31 DIAGNOSIS — Z992 Dependence on renal dialysis: Secondary | ICD-10-CM | POA: Diagnosis not present

## 2021-12-31 DIAGNOSIS — N2581 Secondary hyperparathyroidism of renal origin: Secondary | ICD-10-CM | POA: Diagnosis not present

## 2022-01-02 ENCOUNTER — Telehealth: Payer: Self-pay

## 2022-01-02 DIAGNOSIS — N186 End stage renal disease: Secondary | ICD-10-CM | POA: Diagnosis not present

## 2022-01-02 DIAGNOSIS — N2581 Secondary hyperparathyroidism of renal origin: Secondary | ICD-10-CM | POA: Diagnosis not present

## 2022-01-02 DIAGNOSIS — Z992 Dependence on renal dialysis: Secondary | ICD-10-CM | POA: Diagnosis not present

## 2022-01-02 DIAGNOSIS — E875 Hyperkalemia: Secondary | ICD-10-CM | POA: Diagnosis not present

## 2022-01-02 DIAGNOSIS — D689 Coagulation defect, unspecified: Secondary | ICD-10-CM | POA: Diagnosis not present

## 2022-01-02 NOTE — Chronic Care Management (AMB) (Addendum)
Chronic Care Management Pharmacy Assistant   Name: Whitney Walker  MRN: [id number] DOB: 22-Jan-1956  Reason for Encounter: Medication Review/ Medication coordination follow up  Recent office visits:  12-11-2021   Recent consult visits:  11-27-2021 Verneda Skill (Cardiology). STOP Prosource plus. START Zetia 10 mg daily. INCREASE Carvedilol 6.25 mg twice daily TO 12.5 mg twice daily.  Hospital visits:  discharge summary, patients EMR and Pharmacy list, and upon discussion with patient.   Admitted to the hospital on 07-16-2021 due to sepsis. Discharge date was 07-22-2021. Discharged from [facility]?Medications Started at [facility] Discharge:?? Cefazolin inject 2 grams into vein in Monday, Wednesday and Friday with dialysis. Colace 100 mg twice daily as needed Aspirin 81 mg daily Lidoderm 5% one patch onto skin daily as needed. Discard patch after 12 hours Melatonin 3 mg at bedtime as needed Miralax 17 g as needed for constipation ProSource Plus   Medication Changes at Hospital Discharge: None   Medications Discontinued at Hospital Discharge: Amlodipine 5 mg Carvedilol 12.5 mg Nystatin powder Prednisone 10 mg   Medications that remain the same after Hospital Discharge:??  -All other medications will remain the same.    Medications: Outpatient Encounter Medications as of 01/02/2022  Medication Sig   acetaminophen (TYLENOL) 500 MG tablet Take 1,000 mg by mouth 2 (two) times daily as needed for moderate pain or headache.   albuterol (VENTOLIN HFA) 108 (90 Base) MCG/ACT inhaler Inhale 2 puffs into the lungs every 6 (six) hours as needed for wheezing or shortness of breath.   Ascorbic Acid (VITAMIN C) 100 MG tablet Take 100 mg by mouth daily.   atorvastatin (LIPITOR) 10 MG tablet TAKE ONE TABLET BY MOUTH ONCE DAILY   AURYXIA 1 GM 210 MG(Fe) tablet Take 420-840 mg by mouth with breakfast, with lunch, and with evening meal. Taking  2 tablets  daily (420mg ) with each snack   Biotin 10000 MCG TABS Take 10,000 mcg by mouth daily.    carvedilol (COREG) 12.5 MG tablet Take 1 tablet (12.5 mg total) by mouth 2 (two) times daily.   cyclobenzaprine (FLEXERIL) 10 MG tablet Take 1 tablet (10 mg total) by mouth 3 (three) times daily as needed for muscle spasms.   ELIQUIS 2.5 MG TABS tablet TAKE ONE TABLET BY MOUTH TWICE DAILY   ezetimibe (ZETIA) 10 MG tablet Take 1 tablet (10 mg total) by mouth daily.   lidocaine (LIDODERM) 5 % Place 1 patch onto the skin daily as needed. Remove & Discard patch within 12 hours or as directed by MD   meclizine (ANTIVERT) 12.5 MG tablet Take 1 tablet (12.5 mg total) by mouth 3 (three) times daily as needed for dizziness.   multivitamin (RENA-VIT) TABS tablet Take 1 tablet by mouth at bedtime.   mupirocin ointment (BACTROBAN) 2 % Apply 1 application topically daily as needed (skin bumps).   omeprazole (PRILOSEC) 20 MG capsule Take 1 capsule (20 mg total) by mouth daily.   oxyCODONE-acetaminophen (PERCOCET) 5-325 MG tablet Take 1 tablet by mouth every 4 (four) hours as needed for severe pain.   vitamin E 200 UNIT capsule Take 200 Units by mouth daily.   No facility-administered encounter medications on file as of 01/02/2022.   01-02-2022: [facility] informed me that patient's medication sync days are off and wants to confirm what medications are needed. Called patient to confirm if medications are needed. Patient stated she stopped taking flexeril, zetia and atorvastatin. Patient stated  she still has carvedilol 6.25 mg two tablets twice daily left and she will start the new bottle of 12.5 twice daily that is a 30 day supply. Patient stated she has 15 days left of eliquis. Informed Chasity.   01-03-2022: Completed resync form.  01-25-2022: Informed Chasity resync form didn't save on 01-03-2022 but did confirm with patient that the only medications needed for deliveries are omeprazole , eliquis and  Carvedilol.   Care Gaps: Shingrix overdue Covid booster overdue Flu vaccine overdue Last AWV 08-09-2021  Star Rating Drugs: Atorvastatin 20 mg- Last filled 11-09-2021 30 [facility]   [place] Clinical Pharmacist Assistant [phone]

## 2022-01-04 DIAGNOSIS — N2581 Secondary hyperparathyroidism of renal origin: Secondary | ICD-10-CM | POA: Diagnosis not present

## 2022-01-04 DIAGNOSIS — Z992 Dependence on renal dialysis: Secondary | ICD-10-CM | POA: Diagnosis not present

## 2022-01-04 DIAGNOSIS — E875 Hyperkalemia: Secondary | ICD-10-CM | POA: Diagnosis not present

## 2022-01-04 DIAGNOSIS — D689 Coagulation defect, unspecified: Secondary | ICD-10-CM | POA: Diagnosis not present

## 2022-01-04 DIAGNOSIS — N186 End stage renal disease: Secondary | ICD-10-CM | POA: Diagnosis not present

## 2022-01-07 DIAGNOSIS — N186 End stage renal disease: Secondary | ICD-10-CM | POA: Diagnosis not present

## 2022-01-07 DIAGNOSIS — Z992 Dependence on renal dialysis: Secondary | ICD-10-CM | POA: Diagnosis not present

## 2022-01-07 DIAGNOSIS — N2581 Secondary hyperparathyroidism of renal origin: Secondary | ICD-10-CM | POA: Diagnosis not present

## 2022-01-07 DIAGNOSIS — E875 Hyperkalemia: Secondary | ICD-10-CM | POA: Diagnosis not present

## 2022-01-07 DIAGNOSIS — D689 Coagulation defect, unspecified: Secondary | ICD-10-CM | POA: Diagnosis not present

## 2022-01-09 DIAGNOSIS — E875 Hyperkalemia: Secondary | ICD-10-CM | POA: Diagnosis not present

## 2022-01-09 DIAGNOSIS — N2581 Secondary hyperparathyroidism of renal origin: Secondary | ICD-10-CM | POA: Diagnosis not present

## 2022-01-09 DIAGNOSIS — N186 End stage renal disease: Secondary | ICD-10-CM | POA: Diagnosis not present

## 2022-01-09 DIAGNOSIS — D689 Coagulation defect, unspecified: Secondary | ICD-10-CM | POA: Diagnosis not present

## 2022-01-09 DIAGNOSIS — Z992 Dependence on renal dialysis: Secondary | ICD-10-CM | POA: Diagnosis not present

## 2022-01-11 DIAGNOSIS — N186 End stage renal disease: Secondary | ICD-10-CM | POA: Diagnosis not present

## 2022-01-11 DIAGNOSIS — D689 Coagulation defect, unspecified: Secondary | ICD-10-CM | POA: Diagnosis not present

## 2022-01-11 DIAGNOSIS — E875 Hyperkalemia: Secondary | ICD-10-CM | POA: Diagnosis not present

## 2022-01-11 DIAGNOSIS — Z992 Dependence on renal dialysis: Secondary | ICD-10-CM | POA: Diagnosis not present

## 2022-01-11 DIAGNOSIS — N2581 Secondary hyperparathyroidism of renal origin: Secondary | ICD-10-CM | POA: Diagnosis not present

## 2022-01-14 DIAGNOSIS — N2581 Secondary hyperparathyroidism of renal origin: Secondary | ICD-10-CM | POA: Diagnosis not present

## 2022-01-14 DIAGNOSIS — Z992 Dependence on renal dialysis: Secondary | ICD-10-CM | POA: Diagnosis not present

## 2022-01-14 DIAGNOSIS — E875 Hyperkalemia: Secondary | ICD-10-CM | POA: Diagnosis not present

## 2022-01-14 DIAGNOSIS — N186 End stage renal disease: Secondary | ICD-10-CM | POA: Diagnosis not present

## 2022-01-14 DIAGNOSIS — D689 Coagulation defect, unspecified: Secondary | ICD-10-CM | POA: Diagnosis not present

## 2022-01-16 DIAGNOSIS — N2581 Secondary hyperparathyroidism of renal origin: Secondary | ICD-10-CM | POA: Diagnosis not present

## 2022-01-16 DIAGNOSIS — Z992 Dependence on renal dialysis: Secondary | ICD-10-CM | POA: Diagnosis not present

## 2022-01-16 DIAGNOSIS — E875 Hyperkalemia: Secondary | ICD-10-CM | POA: Diagnosis not present

## 2022-01-16 DIAGNOSIS — D689 Coagulation defect, unspecified: Secondary | ICD-10-CM | POA: Diagnosis not present

## 2022-01-16 DIAGNOSIS — N186 End stage renal disease: Secondary | ICD-10-CM | POA: Diagnosis not present

## 2022-01-17 DIAGNOSIS — Z992 Dependence on renal dialysis: Secondary | ICD-10-CM | POA: Diagnosis not present

## 2022-01-17 DIAGNOSIS — N2581 Secondary hyperparathyroidism of renal origin: Secondary | ICD-10-CM | POA: Diagnosis not present

## 2022-01-17 DIAGNOSIS — N186 End stage renal disease: Secondary | ICD-10-CM | POA: Diagnosis not present

## 2022-01-17 DIAGNOSIS — D689 Coagulation defect, unspecified: Secondary | ICD-10-CM | POA: Diagnosis not present

## 2022-01-17 DIAGNOSIS — E875 Hyperkalemia: Secondary | ICD-10-CM | POA: Diagnosis not present

## 2022-01-18 DIAGNOSIS — E875 Hyperkalemia: Secondary | ICD-10-CM | POA: Diagnosis not present

## 2022-01-18 DIAGNOSIS — Z992 Dependence on renal dialysis: Secondary | ICD-10-CM | POA: Diagnosis not present

## 2022-01-18 DIAGNOSIS — D689 Coagulation defect, unspecified: Secondary | ICD-10-CM | POA: Diagnosis not present

## 2022-01-18 DIAGNOSIS — N186 End stage renal disease: Secondary | ICD-10-CM | POA: Diagnosis not present

## 2022-01-18 DIAGNOSIS — N2581 Secondary hyperparathyroidism of renal origin: Secondary | ICD-10-CM | POA: Diagnosis not present

## 2022-01-21 DIAGNOSIS — N186 End stage renal disease: Secondary | ICD-10-CM | POA: Diagnosis not present

## 2022-01-21 DIAGNOSIS — E875 Hyperkalemia: Secondary | ICD-10-CM | POA: Diagnosis not present

## 2022-01-21 DIAGNOSIS — D689 Coagulation defect, unspecified: Secondary | ICD-10-CM | POA: Diagnosis not present

## 2022-01-21 DIAGNOSIS — N2581 Secondary hyperparathyroidism of renal origin: Secondary | ICD-10-CM | POA: Diagnosis not present

## 2022-01-21 DIAGNOSIS — Z992 Dependence on renal dialysis: Secondary | ICD-10-CM | POA: Diagnosis not present

## 2022-01-23 DIAGNOSIS — Z992 Dependence on renal dialysis: Secondary | ICD-10-CM | POA: Diagnosis not present

## 2022-01-23 DIAGNOSIS — E875 Hyperkalemia: Secondary | ICD-10-CM | POA: Diagnosis not present

## 2022-01-23 DIAGNOSIS — D689 Coagulation defect, unspecified: Secondary | ICD-10-CM | POA: Diagnosis not present

## 2022-01-23 DIAGNOSIS — N2581 Secondary hyperparathyroidism of renal origin: Secondary | ICD-10-CM | POA: Diagnosis not present

## 2022-01-23 DIAGNOSIS — N186 End stage renal disease: Secondary | ICD-10-CM | POA: Diagnosis not present

## 2022-01-25 DIAGNOSIS — Z992 Dependence on renal dialysis: Secondary | ICD-10-CM | POA: Diagnosis not present

## 2022-01-25 DIAGNOSIS — N186 End stage renal disease: Secondary | ICD-10-CM | POA: Diagnosis not present

## 2022-01-25 DIAGNOSIS — E875 Hyperkalemia: Secondary | ICD-10-CM | POA: Diagnosis not present

## 2022-01-25 DIAGNOSIS — D689 Coagulation defect, unspecified: Secondary | ICD-10-CM | POA: Diagnosis not present

## 2022-01-25 DIAGNOSIS — N2581 Secondary hyperparathyroidism of renal origin: Secondary | ICD-10-CM | POA: Diagnosis not present

## 2022-01-28 DIAGNOSIS — E875 Hyperkalemia: Secondary | ICD-10-CM | POA: Diagnosis not present

## 2022-01-28 DIAGNOSIS — N2581 Secondary hyperparathyroidism of renal origin: Secondary | ICD-10-CM | POA: Diagnosis not present

## 2022-01-28 DIAGNOSIS — N186 End stage renal disease: Secondary | ICD-10-CM | POA: Diagnosis not present

## 2022-01-28 DIAGNOSIS — D689 Coagulation defect, unspecified: Secondary | ICD-10-CM | POA: Diagnosis not present

## 2022-01-28 DIAGNOSIS — Z992 Dependence on renal dialysis: Secondary | ICD-10-CM | POA: Diagnosis not present

## 2022-01-29 DIAGNOSIS — N186 End stage renal disease: Secondary | ICD-10-CM | POA: Diagnosis not present

## 2022-01-29 DIAGNOSIS — Z992 Dependence on renal dialysis: Secondary | ICD-10-CM | POA: Diagnosis not present

## 2022-01-29 DIAGNOSIS — I129 Hypertensive chronic kidney disease with stage 1 through stage 4 chronic kidney disease, or unspecified chronic kidney disease: Secondary | ICD-10-CM | POA: Diagnosis not present

## 2022-01-30 DIAGNOSIS — N186 End stage renal disease: Secondary | ICD-10-CM | POA: Diagnosis not present

## 2022-01-30 DIAGNOSIS — Z992 Dependence on renal dialysis: Secondary | ICD-10-CM | POA: Diagnosis not present

## 2022-01-30 DIAGNOSIS — D689 Coagulation defect, unspecified: Secondary | ICD-10-CM | POA: Diagnosis not present

## 2022-01-30 DIAGNOSIS — N2581 Secondary hyperparathyroidism of renal origin: Secondary | ICD-10-CM | POA: Diagnosis not present

## 2022-01-30 DIAGNOSIS — E875 Hyperkalemia: Secondary | ICD-10-CM | POA: Diagnosis not present

## 2022-02-01 DIAGNOSIS — Z992 Dependence on renal dialysis: Secondary | ICD-10-CM | POA: Diagnosis not present

## 2022-02-01 DIAGNOSIS — N186 End stage renal disease: Secondary | ICD-10-CM | POA: Diagnosis not present

## 2022-02-01 DIAGNOSIS — E875 Hyperkalemia: Secondary | ICD-10-CM | POA: Diagnosis not present

## 2022-02-01 DIAGNOSIS — D689 Coagulation defect, unspecified: Secondary | ICD-10-CM | POA: Diagnosis not present

## 2022-02-01 DIAGNOSIS — N2581 Secondary hyperparathyroidism of renal origin: Secondary | ICD-10-CM | POA: Diagnosis not present

## 2022-02-04 DIAGNOSIS — N2581 Secondary hyperparathyroidism of renal origin: Secondary | ICD-10-CM | POA: Diagnosis not present

## 2022-02-04 DIAGNOSIS — Z992 Dependence on renal dialysis: Secondary | ICD-10-CM | POA: Diagnosis not present

## 2022-02-04 DIAGNOSIS — E875 Hyperkalemia: Secondary | ICD-10-CM | POA: Diagnosis not present

## 2022-02-04 DIAGNOSIS — D689 Coagulation defect, unspecified: Secondary | ICD-10-CM | POA: Diagnosis not present

## 2022-02-04 DIAGNOSIS — N186 End stage renal disease: Secondary | ICD-10-CM | POA: Diagnosis not present

## 2022-02-05 DIAGNOSIS — D689 Coagulation defect, unspecified: Secondary | ICD-10-CM | POA: Diagnosis not present

## 2022-02-05 DIAGNOSIS — N2581 Secondary hyperparathyroidism of renal origin: Secondary | ICD-10-CM | POA: Diagnosis not present

## 2022-02-05 DIAGNOSIS — N186 End stage renal disease: Secondary | ICD-10-CM | POA: Diagnosis not present

## 2022-02-05 DIAGNOSIS — Z992 Dependence on renal dialysis: Secondary | ICD-10-CM | POA: Diagnosis not present

## 2022-02-05 DIAGNOSIS — E875 Hyperkalemia: Secondary | ICD-10-CM | POA: Diagnosis not present

## 2022-02-06 DIAGNOSIS — E875 Hyperkalemia: Secondary | ICD-10-CM | POA: Diagnosis not present

## 2022-02-06 DIAGNOSIS — N186 End stage renal disease: Secondary | ICD-10-CM | POA: Diagnosis not present

## 2022-02-06 DIAGNOSIS — Z992 Dependence on renal dialysis: Secondary | ICD-10-CM | POA: Diagnosis not present

## 2022-02-06 DIAGNOSIS — N2581 Secondary hyperparathyroidism of renal origin: Secondary | ICD-10-CM | POA: Diagnosis not present

## 2022-02-06 DIAGNOSIS — D689 Coagulation defect, unspecified: Secondary | ICD-10-CM | POA: Diagnosis not present

## 2022-02-08 DIAGNOSIS — N2581 Secondary hyperparathyroidism of renal origin: Secondary | ICD-10-CM | POA: Diagnosis not present

## 2022-02-08 DIAGNOSIS — Z992 Dependence on renal dialysis: Secondary | ICD-10-CM | POA: Diagnosis not present

## 2022-02-08 DIAGNOSIS — D689 Coagulation defect, unspecified: Secondary | ICD-10-CM | POA: Diagnosis not present

## 2022-02-08 DIAGNOSIS — N186 End stage renal disease: Secondary | ICD-10-CM | POA: Diagnosis not present

## 2022-02-08 DIAGNOSIS — E875 Hyperkalemia: Secondary | ICD-10-CM | POA: Diagnosis not present

## 2022-02-11 ENCOUNTER — Telehealth: Payer: Medicare Other

## 2022-02-11 DIAGNOSIS — D689 Coagulation defect, unspecified: Secondary | ICD-10-CM | POA: Diagnosis not present

## 2022-02-11 DIAGNOSIS — N186 End stage renal disease: Secondary | ICD-10-CM | POA: Diagnosis not present

## 2022-02-11 DIAGNOSIS — E875 Hyperkalemia: Secondary | ICD-10-CM | POA: Diagnosis not present

## 2022-02-11 DIAGNOSIS — Z992 Dependence on renal dialysis: Secondary | ICD-10-CM | POA: Diagnosis not present

## 2022-02-11 DIAGNOSIS — N2581 Secondary hyperparathyroidism of renal origin: Secondary | ICD-10-CM | POA: Diagnosis not present

## 2022-02-13 DIAGNOSIS — E875 Hyperkalemia: Secondary | ICD-10-CM | POA: Diagnosis not present

## 2022-02-13 DIAGNOSIS — Z992 Dependence on renal dialysis: Secondary | ICD-10-CM | POA: Diagnosis not present

## 2022-02-13 DIAGNOSIS — N2581 Secondary hyperparathyroidism of renal origin: Secondary | ICD-10-CM | POA: Diagnosis not present

## 2022-02-13 DIAGNOSIS — N186 End stage renal disease: Secondary | ICD-10-CM | POA: Diagnosis not present

## 2022-02-13 DIAGNOSIS — D689 Coagulation defect, unspecified: Secondary | ICD-10-CM | POA: Diagnosis not present

## 2022-02-15 DIAGNOSIS — N2581 Secondary hyperparathyroidism of renal origin: Secondary | ICD-10-CM | POA: Diagnosis not present

## 2022-02-15 DIAGNOSIS — Z992 Dependence on renal dialysis: Secondary | ICD-10-CM | POA: Diagnosis not present

## 2022-02-15 DIAGNOSIS — E875 Hyperkalemia: Secondary | ICD-10-CM | POA: Diagnosis not present

## 2022-02-15 DIAGNOSIS — D689 Coagulation defect, unspecified: Secondary | ICD-10-CM | POA: Diagnosis not present

## 2022-02-15 DIAGNOSIS — N186 End stage renal disease: Secondary | ICD-10-CM | POA: Diagnosis not present

## 2022-02-18 DIAGNOSIS — N2581 Secondary hyperparathyroidism of renal origin: Secondary | ICD-10-CM | POA: Diagnosis not present

## 2022-02-18 DIAGNOSIS — Z992 Dependence on renal dialysis: Secondary | ICD-10-CM | POA: Diagnosis not present

## 2022-02-18 DIAGNOSIS — N186 End stage renal disease: Secondary | ICD-10-CM | POA: Diagnosis not present

## 2022-02-18 DIAGNOSIS — E875 Hyperkalemia: Secondary | ICD-10-CM | POA: Diagnosis not present

## 2022-02-18 DIAGNOSIS — D689 Coagulation defect, unspecified: Secondary | ICD-10-CM | POA: Diagnosis not present

## 2022-02-19 ENCOUNTER — Other Ambulatory Visit: Payer: Self-pay

## 2022-02-19 ENCOUNTER — Ambulatory Visit: Payer: Medicare Other | Admitting: Student

## 2022-02-19 ENCOUNTER — Encounter: Payer: Self-pay | Admitting: Student

## 2022-02-19 VITALS — BP 138/68 | HR 84 | Temp 98.5°F | Resp 17 | Ht 61.0 in | Wt 205.0 lb

## 2022-02-19 DIAGNOSIS — I5032 Chronic diastolic (congestive) heart failure: Secondary | ICD-10-CM | POA: Diagnosis not present

## 2022-02-19 DIAGNOSIS — I2510 Atherosclerotic heart disease of native coronary artery without angina pectoris: Secondary | ICD-10-CM

## 2022-02-19 DIAGNOSIS — I6523 Occlusion and stenosis of bilateral carotid arteries: Secondary | ICD-10-CM | POA: Diagnosis not present

## 2022-02-19 DIAGNOSIS — I700 Atherosclerosis of aorta: Secondary | ICD-10-CM

## 2022-02-19 MED ORDER — ATORVASTATIN CALCIUM 10 MG PO TABS: 10.0000 mg | ORAL_TABLET | Freq: Every day | ORAL | 3 refills | Status: DC

## 2022-02-19 NOTE — Progress Notes (Signed)
Primary Physician/Referring:  [name], FNP  Patient ID: Whitney Walker, female    DOB: 12-Dec-1956, 66 y.o.   MRN: [id number]  Chief Complaint  Patient presents with   hld   Hypertension    3 month   HPI:    Whitney Walker  is a 66 y.o. African-American female with history of obesity, hypertension, hyperlipidemia, tobacco use, stage IV kidney disease on dialysis, obstructive sleep apnea not on CPAP, COPD and severe peripheral arterial disease with history of left femoral-popliteal bypass surgery by Dr. Victorino Dike in 2014.  Patient is on chronic anticoagulation with Eliquis due to recurrent DVT and PE in 2014 and 2016.  Patient is followed by hematology for hypercoagulable state.  She is currently undergoing hemodialysis on a Monday/Wednesday/Friday schedule.  Patient presents for 53-month follow-up.  At last office visit increase carvedilol to 12.5 mg p.o. twice daily given elevated heart rate. Patient is feeling relatively well without specific complaints today.  However she does report she developed an itchy rash which she felt may have been related to Zetia or atorvastatin, she therefore discontinued both medications.   Patient denies recurrence of chest pain or dyspnea.  Past Medical History:  Diagnosis Date   Anemia    Anxiety    CHF (congestive heart failure) ([place])    Colon cancer ([place])    treatment surgery   Complication of anesthesia    after first C- [place] "couldnt walk after", patient denies having a spinal   COPD (chronic obstructive pulmonary disease) ([place])    Coronary artery disease    Depression    DVT (deep venous thrombosis) (HCC)    ESRD (end stage renal disease) ([place])    Hemo: MWF   History of blood transfusion 04/2018   Hypertension    07/07/18- no longer takes BP medications   Meningitis    Pain in limb 07/30/2013   PE (pulmonary embolism)    Peripheral vascular disease (HCC)    Restless legs    Shortness of breath    with exertion   Sleep  apnea    SOB (shortness of breath) 03/03/2019   Vertigo    Past Surgical History:  Procedure Laterality Date   ABDOMINAL AORTAGRAM N/A 07/26/2013   Procedure: ABDOMINAL Maxcine Ham;  Surgeon: [name] [place], MD;  Location: [facility] CATH LAB;  Service: Cardiovascular;  Laterality: N/A;   ABDOMINAL HYSTERECTOMY     AV FISTULA PLACEMENT Left 05/05/2018   Procedure: ARTERIOVENOUS (AV) FISTULA CREATION BRACHIOCEPHALIC;  Surgeon: Waynetta Sandy, MD;  Location: [place];  Service: Vascular;  Laterality: Left;   AV FISTULA PLACEMENT Left 07/16/2018   Procedure: ARTERIOVENOUS FISTULA CREATION;  Surgeon: Waynetta Sandy, MD;  Location: [place];  Service: Vascular;  Laterality: Left;   [place] Left 09/03/2018   Procedure: BASILIC VEIN TRANSPOSITION SECOND STAGE;  Surgeon: Waynetta Sandy, MD;  Location: [place];  Service: Vascular;  Laterality: Left;   BREAST BIOPSY Left    CESAREAN SECTION     X 3 1974-1977   CHOLECYSTECTOMY     CHOLECYSTECTOMY  1980/s   COLECTOMY  2010   DIVERTICULOSIS SURGERY-2002  2012   FEMORAL-POPLITEAL BYPASS GRAFT Left 08/13/2013   Procedure: BYPASS GRAFT FEMORAL-POPLITEAL ARTERY WITH NON-REVERSED SAPHANEOUS VEIN; ULTRASOUND GUIDED;  Surgeon: Mal [name], MD;  Location: [place];  Service: Vascular;  Laterality: Left;   INSERTION OF DIALYSIS CATHETER Right 04/30/2018   Procedure: INSERTION OF Right Internal Jugular DIALYSIS CATHETER;  Surgeon: Harold Barban  W, MD;  Location: [facility] OR;  Service: Vascular;  Laterality: Right;   IR FLUORO GUIDE CV LINE RIGHT  07/24/2020   IR FLUORO GUIDE CV LINE RIGHT  07/21/2021   IR REMOVAL TUN CV CATH W/O FL  07/21/2020   IR REMOVAL TUN CV CATH W/O FL  07/19/2021   IR US GUIDE VASC ACCESS RIGHT  07/24/2020   IR US GUIDE VASC ACCESS RIGHT  07/21/2021   LEFT HEART CATH AND CORONARY ANGIOGRAPHY N/A 05/01/2021   Procedure: LEFT HEART CATH AND CORONARY ANGIOGRAPHY;  Surgeon: [name], MD;  Location: [facility] CV LAB;   Service: Cardiovascular;  Laterality: N/A;   LOWER EXTREMITY ANGIOGRAM Left 07/26/2013   Procedure: LOWER EXTREMITY ANGIOGRAM;  Surgeon: [name] [place], MD;  Location: [facility] CATH LAB;  Service: Cardiovascular;  Laterality: Left;   Family History  Problem Relation Age of Onset   Cancer Mother 38       breast and bone   Cancer Father 65       prostate   Hypertension Sister    Bleeding Disorder Sister    Cancer Cousin 68       breast cancer    Hypertension Daughter    Breast cancer Neg Hx     Social History   Tobacco Use   Smoking status: Some Days    Packs/day: 0.50    Years: 50.00    Pack years: 25.00    Types: Cigarettes    Last attempt to quit: 02/03/2021    Years since quitting: 1.0   Smokeless tobacco: Former   Tobacco comments:    She is using a vape  Substance Use Topics   Alcohol use: No    Alcohol/week: 0.0 standard drinks    Comment: she used to drink alcohol, quit in 2010; h/o heavy use    Marital Status: Single   ROS  Review of Systems  Cardiovascular:  Negative for chest pain, claudication, leg swelling, near-syncope, orthopnea, palpitations, paroxysmal nocturnal dyspnea and syncope.  Respiratory:  Negative for shortness of breath.    Objective  Blood pressure 138/68, pulse 84, temperature 98.5 F (36.9 C), temperature source Temporal, resp. rate 17, height 5\' 1"  (1.549 m), weight 205 lb (93 kg), SpO2 99 %.  Vitals with BMI 02/19/2022 11/27/2021 11/15/2021  Height 5\' 1"  5\' 1"  5' 1.5"  Weight 205 lbs 202 lbs 6 oz 199 lbs 10 oz  BMI 38.75 13.24 40.10  Systolic 272 536 644  Diastolic 68 59 62  Pulse 84 102 108    Physical Exam Vitals reviewed.  Constitutional:      Appearance: She is obese.  Cardiovascular:     Rate and Rhythm: Normal rate and regular rhythm.     Pulses: Intact distal pulses.     Heart sounds: S1 normal and S2 normal. Murmur heard.  Harsh midsystolic murmur is present at the upper right sternal border.    No friction rub. No gallop.      Comments: Tunneled dialysis catheter in place  Pulmonary:     Effort: Pulmonary effort is normal. No respiratory distress.     Breath sounds: No wheezing, rhonchi or rales.  Musculoskeletal:     Right lower leg: No edema.     Left lower leg: No edema.  Neurological:     Mental Status: She is alert.   Laboratory examination:   Recent Labs    07/20/21 0149 07/21/21 0120 07/22/21 0046 11/15/21 1145  NA 136 133* 134* 141  K  3.8 3.6 4.0 5.3*  CL 102 101 97* 99  CO2 21* 17* 22 18*  GLUCOSE 121* 126* 78 84  BUN 34* 42* 18 25  CREATININE 8.09* 9.47* 6.33* 6.96*  CALCIUM 6.9* 7.0* 7.8* 8.9  GFRNONAA 5* 4* 7*  --    CrCl cannot be calculated ([name] most recent lab result is older than the maximum 21 days allowed.).  CMP Latest Ref Rng & Units 11/15/2021 07/22/2021 07/21/2021  Glucose 70 - 99 mg/dL 84 78 126(H)  BUN 8 - 27 mg/dL 25 18 42(H)  Creatinine 0.57 - 1.00 mg/dL 6.96(H) 6.33(H) 9.47(H)  Sodium 134 - 144 mmol/L 141 134(L) 133(L)  Potassium 3.5 - 5.2 mmol/L 5.3(H) 4.0 3.6  Chloride 96 - 106 mmol/L 99 97(L) 101  CO2 20 - 29 mmol/L 18(L) 22 17(L)  Calcium 8.7 - 10.3 mg/dL 8.9 7.8(L) 7.0(L)  Total Protein 6.0 - 8.5 g/dL 7.4 - -  Total Bilirubin 0.0 - 1.2 mg/dL 0.3 - -  Alkaline Phos 44 - 121 IU/L 196(H) - -  AST 0 - 40 IU/L 30 - -  ALT 0 - 32 IU/L 29 - -   CBC Latest Ref Rng & Units 07/22/2021 07/21/2021 07/20/2021  WBC 4.0 - 10.5 K/uL 19.9(H) 15.5(H) 13.2(H)  Hemoglobin 12.0 - 15.0 g/dL 10.2(L) 8.9(L) 9.3(L)  Hematocrit 36.0 - 46.0 % 32.5(L) 27.7(L) 28.8(L)  Platelets 150 - 400 K/uL 184 206 160    Lipid Panel Recent Labs    03/15/21 1143  CHOL 183  TRIG 159*  LDLCALC 100*  HDL 55  CHOLHDL 3.3    HEMOGLOBIN A1C Lab Results  Component Value Date   HGBA1C 5.9 (H) 11/15/2021   MPG 131.24 07/16/2021   TSH No results for input(s): TSH in the last 8760 hours.  External labs:   06/04/2018: HDL 63, LDL 97, total cholesterol 191, triglycerides 155  Allergies    Allergies  Allergen Reactions   Carnosine     Other reaction(s): Unknown   Gadolinium Derivatives Hives and Other (See Comments)    HIVES, Desc: HIVES W/ "DYE" USED FOR 1ST CT SCAN BUT NOT 2ND, NO PREMEDS USED, PT UNCERTAIN OF CIRCUMSTANCES,,?POSSIBLE MRI CONTRAST ALLERGY, ALL STUDIES DONE "SOMEWHERE" IN [place]//A.C., Onset Date: [id number]   Iohexol Other (See Comments)     Code: HIVES, Desc: HIVES W/ "DYE" USED FOR 1ST CT SCAN BUT NOT 2ND, NO PREMEDS USED, PT UNCERTAIN OF CIRCUMSTANCES,,?POSSIBLE MRI CONTRAST ALLERGY, ALL STUDIES DONE "SOMEWHERE" IN [place]//A.C., Onset Date: [id number]    Iodinated Contrast Media Hives   Iodine Hives   Naltrexone     Other reaction(s): Unknown    Medications Prior to Visit:   Outpatient Medications Prior to Visit  Medication Sig Dispense Refill   acetaminophen (TYLENOL) 500 MG tablet Take 1,000 mg by mouth 2 (two) times daily as needed for moderate pain or headache.     albuterol (VENTOLIN HFA) 108 (90 Base) MCG/ACT inhaler Inhale 2 puffs into the lungs every 6 (six) hours as needed for wheezing or shortness of breath. 18 g 0   Ascorbic Acid (VITAMIN C) 100 MG tablet Take 100 mg by mouth daily.     AURYXIA 1 GM 210 MG(Fe) tablet Take 420-840 mg by mouth with breakfast, with lunch, and with evening meal. Taking  2 tablets daily (420mg ) with each snack     Biotin 10000 MCG TABS Take 10,000 mcg by mouth daily.      carvedilol (COREG) 12.5 MG tablet Take 1 tablet (12.5 mg total)  by mouth 2 (two) times daily. 180 tablet 3   cyclobenzaprine (FLEXERIL) 10 MG tablet Take 1 tablet (10 mg total) by mouth 3 (three) times daily as needed for muscle spasms. 30 tablet 1   ELIQUIS 2.5 MG TABS tablet TAKE ONE TABLET BY MOUTH TWICE DAILY 60 tablet 2   meclizine (ANTIVERT) 12.5 MG tablet Take 1 tablet (12.5 mg total) by mouth 3 (three) times daily as needed for dizziness. 30 tablet 0   mupirocin ointment (BACTROBAN) 2 % Apply 1 application topically daily as  needed (skin bumps). 22 g 2   omeprazole (PRILOSEC) 20 MG capsule Take 1 capsule (20 mg total) by mouth daily. 90 capsule 0   oxyCODONE-acetaminophen (PERCOCET) 5-325 MG tablet Take 1 tablet by mouth every 4 (four) hours as needed for severe pain. 30 tablet 0   vitamin E 200 UNIT capsule Take 200 Units by mouth daily.     XANAX 0.5 MG tablet Take 1 tablet by mouth as needed.     atorvastatin (LIPITOR) 10 MG tablet TAKE ONE TABLET BY MOUTH ONCE DAILY 30 tablet 2   ezetimibe (ZETIA) 10 MG tablet Take 1 tablet (10 mg total) by mouth daily. 30 tablet 3   lidocaine (LIDODERM) 5 % Place 1 patch onto the skin daily as needed. Remove & Discard patch within 12 hours or as directed by MD 30 patch 2   multivitamin (RENA-VIT) TABS tablet Take 1 tablet by mouth at bedtime.     No facility-administered medications prior to visit.   Final Medications at End of Visit    Current Meds  Medication Sig   acetaminophen (TYLENOL) 500 MG tablet Take 1,000 mg by mouth 2 (two) times daily as needed for moderate pain or headache.   albuterol (VENTOLIN HFA) 108 (90 Base) MCG/ACT inhaler Inhale 2 puffs into the lungs every 6 (six) hours as needed for wheezing or shortness of breath.   Ascorbic Acid (VITAMIN C) 100 MG tablet Take 100 mg by mouth daily.   AURYXIA 1 GM 210 MG(Fe) tablet Take 420-840 mg by mouth with breakfast, with lunch, and with evening meal. Taking  2 tablets daily (420mg ) with each snack   Biotin 10000 MCG TABS Take 10,000 mcg by mouth daily.    carvedilol (COREG) 12.5 MG tablet Take 1 tablet (12.5 mg total) by mouth 2 (two) times daily.   cyclobenzaprine (FLEXERIL) 10 MG tablet Take 1 tablet (10 mg total) by mouth 3 (three) times daily as needed for muscle spasms.   ELIQUIS 2.5 MG TABS tablet TAKE ONE TABLET BY MOUTH TWICE DAILY   meclizine (ANTIVERT) 12.5 MG tablet Take 1 tablet (12.5 mg total) by mouth 3 (three) times daily as needed for dizziness.   mupirocin ointment (BACTROBAN) 2 % Apply 1  application topically daily as needed (skin bumps).   omeprazole (PRILOSEC) 20 MG capsule Take 1 capsule (20 mg total) by mouth daily.   oxyCODONE-acetaminophen (PERCOCET) 5-325 MG tablet Take 1 tablet by mouth every 4 (four) hours as needed for severe pain.   vitamin E 200 UNIT capsule Take 200 Units by mouth daily.   XANAX 0.5 MG tablet Take 1 tablet by mouth as needed.   Radiology:   No results found.  Cardiac Studies:   Echocardiogram 07/17/2021:  1. Left ventricular ejection fraction, by estimation, is 70 to 75%. The  left ventricle has hyperdynamic function. The left ventricle has no  regional wall motion abnormalities. Left ventricular diastolic parameters  are consistent with Grade I  diastolic  dysfunction (impaired relaxation). Elevated left atrial pressure.   2. Right ventricular systolic function is normal. The right ventricular  size is normal. The estimated right ventricular systolic pressure is 93.2  mmHg.   3. Left atrial size was mildly dilated.   4. The mitral valve is normal in structure. No evidence of mitral valve  regurgitation. Moderate mitral annular calcification.   5. The aortic valve is tricuspid. There is mild calcification of the  aortic valve. There is mild thickening of the aortic valve. Aortic valve  regurgitation is not visualized. Mild to moderate aortic valve  sclerosis/calcification is present, without any  evidence of aortic stenosis. Aortic valve mean gradient measures 14.0  mmHg. Aortic valve Vmax measures 2.52 m/s.   6. The inferior vena cava is normal in size with greater than 50%  respiratory variability, suggesting right atrial pressure of 3 mmHg.   Carotid artery duplex 05/08/2021:  No flow noted in the right ICA (Total occlusion).  Stenosis in the left internal carotid artery (16-49%). Stenosis in the  left external carotid artery (<50%).  Antegrade right vertebral artery flow. Antegrade left vertebral artery  flow.  Compared to the  study done on 11/16/2020, right ACA occlusion is new.    Previously there was no significant disease. Follow up in six months is  appropriate if clinically indicated.  (patient was brought back next day to confirm occlusion, patient  asymptomatic, occlusion confirmed, will arrange OV).  Left heart catheterization 05/01/2021: Moderate disease in the right coronary artery and circumflex coronary artery with diffuse coronary calcification, minimal disease in the LAD.  Normal LV systolic function and normal LVEDP.  No pressure gradient across aortic valve.   Recommendation: Evaluation for noncardiac causes of chest pain is indicated.  30 - 40 mL contrast utilized.  Right femoral arterial access closed with minx. Anticoagulation with Eliquis for hypercoagulable state.  No aspirin although she has moderate coronary disease in view of increased risk of bleeding.  PCV MYOCARDIAL PERFUSION WO LEXISCAN 11/20/2020 Lexiscan not administered at patient's request. Incomplete study. EKG tracing not available for review. Rest images show mild decrease in tracer update in basal inferior myocardium. Consider alternate ischemia studies, if clinically indicated.  CT neck 10/31/2011: Normal soft tissues.  Mild pulmonary artery dilation suggest hypertension.  Sleep study 2007: Sleep apnea, noncompliant with CPAP uses supplemental oxygen only.  EKG:  09/13/2021: Sinus rhythm with first-degree AV block at a rate of 84 bpm.  Left atrial enlargement.  Left axis.  Incomplete right bundle branch block.  Nonspecific T wave abnormality.  03/01/2021: Sinus rhythm at a rate of 96 bpm with first-degree AV block.  Biatrial enlargement.  Left axis, left anterior fascicular block.  T wave abnormality, cannot exclude anterolateral ischemia.  PR depression, suggestive of pericarditis.  11/14/2020: Sinus rhythm at a rate of 82 bpm with first-degree AV block, biatrial enlargement.  Left axis deviation, left anterior fascicular block.   Poor R wave progression, cannot exclude anteroseptal infarct old.  Cannot exclude inferior ischemia.  Incomplete right bundle branch block.  Compared to EKG 10/30/2020 (external), no significant change.  12/03/2018: Sinus tachycardia at 113 beats per minute with first-degree AV block.  Normal axis.  PR WP cannot exclude anterior infarct old.  No evidence of ischemia.  Except for tachycardia no changes noted to EKG.  Assessment     ICD-10-CM   1. Coronary artery disease involving native coronary artery of native heart without angina pectoris  I25.10  2. Chronic diastolic heart failure (HCC)  I50.32     3. Carotid atherosclerosis, bilateral  I65.23 atorvastatin (LIPITOR) 10 MG tablet    4. Aortic atherosclerosis (HCC)  I70.0 atorvastatin (LIPITOR) 10 MG tablet     Medications Discontinued During This Encounter  Medication Reason   atorvastatin (LIPITOR) 10 MG tablet    ezetimibe (ZETIA) 10 MG tablet    lidocaine (LIDODERM) 5 %    multivitamin (RENA-VIT) TABS tablet     Meds ordered this encounter  Medications   atorvastatin (LIPITOR) 10 MG tablet    Sig: Take 1 tablet (10 mg total) by mouth daily.    Dispense:  30 tablet    Refill:  3    Pt gets 90DS   Recommendations:   Whitney Walker is a 66 y.o. AAfrican-American female with history of obesity, hypertension, hyperlipidemia, tobacco use, stage IV kidney disease on dialysis, obstructive sleep apnea not on CPAP, COPD and severe peripheral arterial disease with history of left femoral-popliteal bypass surgery by Dr. Victorino Dike in 2014.  Patient is on chronic anticoagulation with Eliquis due to recurrent DVT and PE in 2014 and 2016.  Patient is followed by hematology for hypercoagulable state.  She is currently undergoing hemodialysis on a Monday/Wednesday/Friday schedule.  Patient presents for 44-month follow-up.  Last office visit increase carvedilol to 12.5 mg p.o. twice daily which she is tolerating well.  [name] heart  rate has improved and blood pressure remains relatively well controlled.  Patient is notably not on ACE inhibitor or ARB due to renal disease.  She has had no recurrence of chest pain, palpitations, or dyspnea.  In regard to hyperlipidemia, discussed with patient that I have a low suspicion rash was related to Zetia or atorvastatin.  Shared decision was for patient to resume atorvastatin at this time.  Can consider rechallenging Zetia in the future.  Again reiterated the importance of diet and lifestyle modifications, particularly weight loss.  Follow-up in 6 months, sooner if needed.   Alethia Berthold, PA-C 02/20/2022, 2:12 PM Office: [phone]

## 2022-02-20 ENCOUNTER — Telehealth: Payer: Self-pay

## 2022-02-20 DIAGNOSIS — D689 Coagulation defect, unspecified: Secondary | ICD-10-CM | POA: Diagnosis not present

## 2022-02-20 DIAGNOSIS — N186 End stage renal disease: Secondary | ICD-10-CM | POA: Diagnosis not present

## 2022-02-20 DIAGNOSIS — Z992 Dependence on renal dialysis: Secondary | ICD-10-CM | POA: Diagnosis not present

## 2022-02-20 DIAGNOSIS — N2581 Secondary hyperparathyroidism of renal origin: Secondary | ICD-10-CM | POA: Diagnosis not present

## 2022-02-20 DIAGNOSIS — E875 Hyperkalemia: Secondary | ICD-10-CM | POA: Diagnosis not present

## 2022-02-20 NOTE — Chronic Care Management (AMB) (Signed)
° ° °  Chronic Care Management Pharmacy Assistant   Name: Whitney Walker  MRN: [id number] DOB: 21-Apr-1956  Reason for Encounter: Medication Review/ Medication coordination  Medications: Outpatient Encounter Medications as of 02/20/2022  Medication Sig   acetaminophen (TYLENOL) 500 MG tablet Take 1,000 mg by mouth 2 (two) times daily as needed for moderate pain or headache.   albuterol (VENTOLIN HFA) 108 (90 Base) MCG/ACT inhaler Inhale 2 puffs into the lungs every 6 (six) hours as needed for wheezing or shortness of breath.   Ascorbic Acid (VITAMIN C) 100 MG tablet Take 100 mg by mouth daily.   atorvastatin (LIPITOR) 10 MG tablet Take 1 tablet (10 mg total) by mouth daily.   AURYXIA 1 GM 210 MG(Fe) tablet Take 420-840 mg by mouth with breakfast, with lunch, and with evening meal. Taking  2 tablets daily (420mg ) with each snack   Biotin 10000 MCG TABS Take 10,000 mcg by mouth daily.    carvedilol (COREG) 12.5 MG tablet Take 1 tablet (12.5 mg total) by mouth 2 (two) times daily.   cyclobenzaprine (FLEXERIL) 10 MG tablet Take 1 tablet (10 mg total) by mouth 3 (three) times daily as needed for muscle spasms.   ELIQUIS 2.5 MG TABS tablet TAKE ONE TABLET BY MOUTH TWICE DAILY   meclizine (ANTIVERT) 12.5 MG tablet Take 1 tablet (12.5 mg total) by mouth 3 (three) times daily as needed for dizziness.   mupirocin ointment (BACTROBAN) 2 % Apply 1 application topically daily as needed (skin bumps).   omeprazole (PRILOSEC) 20 MG capsule Take 1 capsule (20 mg total) by mouth daily.   oxyCODONE-acetaminophen (PERCOCET) 5-325 MG tablet Take 1 tablet by mouth every 4 (four) hours as needed for severe pain.   vitamin E 200 UNIT capsule Take 200 Units by mouth daily.   XANAX 0.5 MG tablet Take 1 tablet by mouth as needed.   No facility-administered encounter medications on file as of 02/20/2022.   02-20-2022: Received message from [facility] pharmacist in reference to patient needing atorvastatin.  Spoke with patient and was informed Cardiology wants her to start back atorvastatin. Prescription sent to [facility]. Updated [place].   [place] Pharmacist Assistant [phone]

## 2022-02-21 ENCOUNTER — Telehealth: Payer: Medicare Other

## 2022-02-21 ENCOUNTER — Ambulatory Visit (INDEPENDENT_AMBULATORY_CARE_PROVIDER_SITE_OTHER): Payer: Medicare Other

## 2022-02-21 DIAGNOSIS — E875 Hyperkalemia: Secondary | ICD-10-CM

## 2022-02-21 DIAGNOSIS — I10 Essential (primary) hypertension: Secondary | ICD-10-CM

## 2022-02-21 DIAGNOSIS — N186 End stage renal disease: Secondary | ICD-10-CM

## 2022-02-21 DIAGNOSIS — I700 Atherosclerosis of aorta: Secondary | ICD-10-CM

## 2022-02-21 DIAGNOSIS — E559 Vitamin D deficiency, unspecified: Secondary | ICD-10-CM

## 2022-02-21 DIAGNOSIS — I5032 Chronic diastolic (congestive) heart failure: Secondary | ICD-10-CM

## 2022-02-21 NOTE — Patient Instructions (Signed)
Visit Information  Thank you for taking time to visit with me today. Please don't hesitate to contact me if I can be of assistance to you before our next scheduled telephone appointment.  Following are the goals we discussed today:  (Copy and paste patient goals from clinical care plan here)  Our next appointment is by telephone on 05/28/22 at 10:30 AM   Please call the care guide team at [phone] if you need to cancel or reschedule your appointment.   If you are experiencing a Mental Health or [place] or need someone to talk to, please call [phone]-TALK (toll free, 24 hour hotline)   Patient verbalizes understanding of instructions and care plan provided today and agrees to view in [place]. Active MyChart status confirmed with patient.    [name], RN, BSN, CCM Care Management Coordinator [facility]  Direct Phone: [phone]

## 2022-02-21 NOTE — Chronic Care Management (AMB) (Signed)
Chronic Care Management   CCM RN Visit Note  02/21/2022 Name: Whitney Walker MRN: [id number] DOB: 1956-10-15  Subjective: Whitney Walker is a 66 y.o. year old female who is a primary care patient of Whitney Walker, [place]. The care management team was consulted for assistance with disease management and care coordination needs.    Engaged with patient by telephone for follow up visit in response to provider referral for case management and/or care coordination services.   Consent to Services:  The patient was given information about Chronic Care Management services, agreed to services, and gave verbal consent prior to initiation of services.  Please see initial visit note for detailed documentation.   Patient agreed to services and verbal consent obtained.   Assessment: Review of patient past medical history, allergies, medications, health status, including review of consultants reports, laboratory and other test data, was performed as part of comprehensive evaluation and provision of chronic care management services.   SDOH (Social Determinants of Health) assessments and interventions performed:  Yes, no acute needs   CCM Care Plan  Allergies  Allergen Reactions   Carnosine     Other reaction(s): Unknown   Gadolinium Derivatives Hives and Other (See Comments)    HIVES, Desc: HIVES W/ "DYE" USED FOR 1ST CT SCAN BUT NOT 2ND, NO PREMEDS USED, PT UNCERTAIN OF CIRCUMSTANCES,,?POSSIBLE MRI CONTRAST ALLERGY, ALL STUDIES DONE "SOMEWHERE" IN [place]//A.C., Onset Date: [id number]   Iohexol Other (See Comments)     Code: HIVES, Desc: HIVES W/ "DYE" USED FOR 1ST CT SCAN BUT NOT 2ND, NO PREMEDS USED, PT UNCERTAIN OF CIRCUMSTANCES,,?POSSIBLE MRI CONTRAST ALLERGY, ALL STUDIES DONE "SOMEWHERE" IN [place]//A.C., Onset Date: [id number]    Iodinated Contrast Media Hives   Iodine Hives   Naltrexone     Other reaction(s): Unknown    Outpatient Encounter Medications as of 02/21/2022   Medication Sig   acetaminophen (TYLENOL) 500 MG tablet Take 1,000 mg by mouth 2 (two) times daily as needed for moderate pain or headache.   albuterol (VENTOLIN HFA) 108 (90 Base) MCG/ACT inhaler Inhale 2 puffs into the lungs every 6 (six) hours as needed for wheezing or shortness of breath.   Ascorbic Acid (VITAMIN C) 100 MG tablet Take 100 mg by mouth daily.   atorvastatin (LIPITOR) 10 MG tablet Take 1 tablet (10 mg total) by mouth daily.   AURYXIA 1 GM 210 MG(Fe) tablet Take 420-840 mg by mouth with breakfast, with lunch, and with evening meal. Taking  2 tablets daily (420mg ) with each snack   Biotin 10000 MCG TABS Take 10,000 mcg by mouth daily.    carvedilol (COREG) 12.5 MG tablet Take 1 tablet (12.5 mg total) by mouth 2 (two) times daily.   cyclobenzaprine (FLEXERIL) 10 MG tablet Take 1 tablet (10 mg total) by mouth 3 (three) times daily as needed for muscle spasms.   ELIQUIS 2.5 MG TABS tablet TAKE ONE TABLET BY MOUTH TWICE DAILY   meclizine (ANTIVERT) 12.5 MG tablet Take 1 tablet (12.5 mg total) by mouth 3 (three) times daily as needed for dizziness.   mupirocin ointment (BACTROBAN) 2 % Apply 1 application topically daily as needed (skin bumps).   omeprazole (PRILOSEC) 20 MG capsule Take 1 capsule (20 mg total) by mouth daily.   oxyCODONE-acetaminophen (PERCOCET) 5-325 MG tablet Take 1 tablet by mouth every 4 (four) hours as needed for severe pain.   vitamin E 200 UNIT capsule Take 200 Units by mouth daily.   XANAX 0.5 MG tablet Take 1  tablet by mouth as needed.   No facility-administered encounter medications on file as of 02/21/2022.    Patient Active Problem List   Diagnosis Date Noted   Protrusion of lumbar intervertebral disc 10/09/2021   Facet arthritis, degenerative, lumbar spine 10/09/2021   Acute low back pain    MSSA bacteremia    Septic shock ([place]) 07/16/2021   Nausea and vomiting    Abdominal pain    FUO (fever of unknown origin)    Polyarthritis    Polyarthralgia     Hyperkalemia, diminished renal excretion 05/28/2021   Generalized weakness 05/28/2021   Rash and nonspecific skin eruption 05/28/2021   Atypical chest pain    Coronary artery calcification seen on CAT scan    Hyperkalemia 05/09/2020   Left leg pain 05/09/2020   Restless leg syndrome 09/19/2018   OSA (obstructive sleep apnea) 04/29/2018   ESRD (end stage renal disease) ([place]) 04/29/2018   Dyspnea on exertion 03/12/2017   Morbid (severe) obesity due to excess calories ([place]) 03/12/2017   Angina pectoris ([place]) 03/02/2017   Anemia of chronic kidney failure, stage 3 (moderate) ([place]) 12/10/2016   History of colon cancer 08/12/2016   Mass of left breast on mammogram 12/10/2015   Depression 10/18/2015   Numbness and tingling of left arm and leg 01/24/2015   PVD (peripheral vascular disease) with claudication ([place]) 03/23/2014   PAD (peripheral artery disease) ([place]) 12/14/2013   Chronic diastolic heart failure ([place]) 11/15/2013   Essential hypertension 11/15/2013   Leucocytosis 04/28/2012    Conditions to be addressed/monitored: ESRD, HTN, CHF, Vitamin D deficiency, Hyperkalemia, Aortic Atherosclerosis , HLD, Vitamin D deficiency    Care Plan : [place] of Care  Updates made by Whitney Logan, RN since 02/21/2022 12:00 AM     Problem: No plan established for management of chronic disease states (ESRD, HTN, CHF, Vitamin D deficiency, Hyperkalemia, Aortic Atherosclerosis , HLD, Vitamin D deficiency)   Priority: High     Long-Range Goal: Development of plan of care for chronic disease management for (ESRD, HTN, CHF, Vitamin D deficiency, Hyperkalemia, Aortic Atherosclerosis , HLD, Vitamin D deficiency)   Start Date: 12/11/2021  Expected End Date: 12/11/2022  Recent Progress: On track  Priority: High  Note:   Current Barriers:  Knowledge Deficits related to plan of care for management of ESRD, HTN, CHF, Vitamin D deficiency, Hyperkalemia, Aortic Atherosclerosis , HLD, Vitamin  D deficiency    Chronic Disease Management support and education needs related to ESRD, HTN, CHF, Vitamin D deficiency, Hyperkalemia, Aortic Atherosclerosis , HLD, Vitamin D deficiency     RNCM Clinical Goal(s):  Patient will verbalize basic understanding of  ESRD, HTN, CHF, Vitamin D deficiency, Hyperkalemia, Aortic Atherosclerosis , HLD, Vitamin D deficiency   disease process and self health management plan as evidenced by patient will report having no disease exacerbations related to her chronic disease states as listed above take all medications exactly as prescribed and will call provider for medication related questions as evidenced by patient will report having no missed doses of her prescribed medications  demonstrate Ongoing health management independence as evidenced by patient will report 100% adherence to her prescribed treatment plan  continue to work with RN Care Manager to address care management and care coordination needs related to  ESRD, HTN, CHF, Vitamin D deficiency, Hyperkalemia, Aortic Atherosclerosis , HLD, Vitamin D deficiency   as evidenced by adherence to CM Team Scheduled appointments demonstrate ongoing self health care management ability   as  evidenced by    through collaboration with RN Care manager, provider, and care team.   CAD Interventions: (Status:  Goal on track:  Yes.) Long Term Goal Assessed understanding of CAD diagnosis 1:1 collaboration with primary care provider regarding development and update of comprehensive plan of care as evidenced by provider attestation and co-signature Inter-disciplinary care team collaboration (see longitudinal plan of care) Evaluation of current treatment plan related to  self management and patient's adherence to plan as established by provider Review of patient status, including review of consultant's reports, relevant laboratory and other test results, and medications completed Reviewed medications with patient and discussed  importance of medication adherence Discussed and reviewed the following Assessment/Plan per Whitney Cruel PA with [facility] Cardiovascular PA:    1. Coronary artery disease involving native coronary artery of native heart without angina pectoris  I25.10       2. Chronic diastolic heart failure (HCC)  I50.32       3. Carotid atherosclerosis, bilateral  I65.23 atorvastatin (LIPITOR) 10 MG tablet     4. Aortic atherosclerosis (HCC)  I70.0 atorvastatin (LIPITOR) 10 MG tablet          Medications Discontinued During This Encounter  Medication Reason   atorvastatin (LIPITOR) 10 MG tablet     ezetimibe (ZETIA) 10 MG tablet     lidocaine (LIDODERM) 5 %     multivitamin (RENA-VIT) TABS tablet      Meds ordered this encounter  Medications   atorvastatin (LIPITOR) 10 MG tablet      Sig: Take 1 tablet (10 mg total) by mouth daily.      Dispense:  30 tablet      Refill:  3      Pt gets 90DS    Recommendations:    Whitney Walker is a 66 y.o. AAfrican-American female with history of obesity, hypertension, hyperlipidemia, tobacco use, stage IV kidney disease on dialysis, obstructive sleep apnea not on CPAP, COPD and severe peripheral arterial disease with history of left femoral-popliteal bypass surgery by Dr. Victorino Walker in 2014.  Patient is on chronic anticoagulation with Eliquis due to recurrent DVT and PE in 2014 and 2016.  Patient is followed by hematology for hypercoagulable state.  She is currently undergoing hemodialysis on a Monday/Wednesday/Friday schedule.   Patient presents for 33-month follow-up.  Last office visit increase carvedilol to 12.5 mg p.o. twice daily which she is tolerating well.  [name] heart rate has improved and blood pressure remains relatively well controlled.  Patient is notably not on ACE inhibitor or ARB due to renal disease.  She has had no recurrence of chest pain, palpitations, or dyspnea.   In regard to hyperlipidemia, discussed with patient that I have  a low suspicion rash was related to Zetia or atorvastatin.  Shared decision was for patient to resume atorvastatin at this time.  Can consider rechallenging Zetia in the future.   Again reiterated the importance of diet and lifestyle modifications, particularly weight loss. Follow-up in 6 months, sooner if needed  Determined patient verbalizes understanding of her prescribed treatment plan Advised patient, providing education and rationale, to monitor blood pressure daily and record, calling PCP for findings outside established parameters Discussed plans with patient for ongoing care management follow up and provided patient with direct contact information for care management team  Vitamin D deficiency:  (Status:  Condition stable.  Not addressed this visit.)  Long Term Goal Evaluation of current treatment plan related to  Vitamin D deficiency , self-management  and patient's adherence to plan as established by provider. Review of patient status, including review of consultant's reports, relevant laboratory and other test results, and medications completed. Discussed plans with patient for ongoing care management follow up and provided patient with direct contact information for care management team Vit D, 25-Hydroxy 30.0 - 100.0 ng/mL 9.0 Low   9.1 Low  CM    Pain Interventions:  (Status:  Condition stable.  Not addressed this visit.) Long Term Goal Pain assessment performed Medications reviewed Reviewed provider established plan for pain management Discussed importance of adherence to all scheduled medical appointments Counseled on the importance of reporting any/all new or changed pain symptoms or management strategies to pain management provider Advised patient to report to care team affect of pain on daily activities Discussed use of relaxation techniques and/or diversional activities to assist with pain reduction (distraction, imagery, relaxation, massage, acupressure, TENS, heat, and cold  application Reviewed with patient prescribed pharmacological and nonpharmacological pain relief strategies Printed educational materials related to Sciatica and exercise Discussed plans with patient for ongoing care management follow up and provided patient with direct contact information for care management team  Patient Goals/Self-Care Activities: Take all medications as prescribed Attend all scheduled provider appointments Call pharmacy for medication refills 3-7 days in advance of running out of medications Perform all self care activities independently  Perform IADL's (shopping, preparing meals, housekeeping, managing finances) independently Call provider office for new concerns or questions   Follow Up Plan:  Telephone follow up appointment with care management team member scheduled for:  05/28/22     Whitney Merino, RN, BSN, CCM Care Management Coordinator [facility]  Direct Phone: [phone]

## 2022-02-22 DIAGNOSIS — N2581 Secondary hyperparathyroidism of renal origin: Secondary | ICD-10-CM | POA: Diagnosis not present

## 2022-02-22 DIAGNOSIS — Z992 Dependence on renal dialysis: Secondary | ICD-10-CM | POA: Diagnosis not present

## 2022-02-22 DIAGNOSIS — D689 Coagulation defect, unspecified: Secondary | ICD-10-CM | POA: Diagnosis not present

## 2022-02-22 DIAGNOSIS — E875 Hyperkalemia: Secondary | ICD-10-CM | POA: Diagnosis not present

## 2022-02-22 DIAGNOSIS — N186 End stage renal disease: Secondary | ICD-10-CM | POA: Diagnosis not present

## 2022-02-25 DIAGNOSIS — N2581 Secondary hyperparathyroidism of renal origin: Secondary | ICD-10-CM | POA: Diagnosis not present

## 2022-02-25 DIAGNOSIS — N186 End stage renal disease: Secondary | ICD-10-CM | POA: Diagnosis not present

## 2022-02-25 DIAGNOSIS — D689 Coagulation defect, unspecified: Secondary | ICD-10-CM | POA: Diagnosis not present

## 2022-02-25 DIAGNOSIS — E875 Hyperkalemia: Secondary | ICD-10-CM | POA: Diagnosis not present

## 2022-02-25 DIAGNOSIS — Z992 Dependence on renal dialysis: Secondary | ICD-10-CM | POA: Diagnosis not present

## 2022-02-26 ENCOUNTER — Telehealth: Payer: Self-pay | Admitting: Nurse Practitioner

## 2022-02-26 DIAGNOSIS — N186 End stage renal disease: Secondary | ICD-10-CM | POA: Diagnosis not present

## 2022-02-26 DIAGNOSIS — H04123 Dry eye syndrome of bilateral lacrimal glands: Secondary | ICD-10-CM | POA: Diagnosis not present

## 2022-02-26 DIAGNOSIS — I129 Hypertensive chronic kidney disease with stage 1 through stage 4 chronic kidney disease, or unspecified chronic kidney disease: Secondary | ICD-10-CM | POA: Diagnosis not present

## 2022-02-26 DIAGNOSIS — I10 Essential (primary) hypertension: Secondary | ICD-10-CM

## 2022-02-26 DIAGNOSIS — Z992 Dependence on renal dialysis: Secondary | ICD-10-CM | POA: Diagnosis not present

## 2022-02-26 DIAGNOSIS — I5032 Chronic diastolic (congestive) heart failure: Secondary | ICD-10-CM

## 2022-02-26 DIAGNOSIS — H25812 Combined forms of age-related cataract, left eye: Secondary | ICD-10-CM | POA: Diagnosis not present

## 2022-02-26 DIAGNOSIS — H43822 Vitreomacular adhesion, left eye: Secondary | ICD-10-CM | POA: Diagnosis not present

## 2022-02-26 NOTE — Telephone Encounter (Signed)
Left message for patient to call back and schedule Medicare Annual Wellness Visit (AWV) either virtually or in office.  Left both my jabber number [phone] and office number    Last AWV 08/09/21  please schedule at anytime with [facility]    This should be a 45 minute visit.  Awv can be schedule calendar year [facility]

## 2022-02-27 DIAGNOSIS — T82594A Other mechanical complication of infusion catheter, initial encounter: Secondary | ICD-10-CM | POA: Diagnosis not present

## 2022-02-27 DIAGNOSIS — Z992 Dependence on renal dialysis: Secondary | ICD-10-CM | POA: Diagnosis not present

## 2022-02-27 DIAGNOSIS — N2581 Secondary hyperparathyroidism of renal origin: Secondary | ICD-10-CM | POA: Diagnosis not present

## 2022-02-27 DIAGNOSIS — E875 Hyperkalemia: Secondary | ICD-10-CM | POA: Diagnosis not present

## 2022-02-27 DIAGNOSIS — D689 Coagulation defect, unspecified: Secondary | ICD-10-CM | POA: Diagnosis not present

## 2022-02-27 DIAGNOSIS — N186 End stage renal disease: Secondary | ICD-10-CM | POA: Diagnosis not present

## 2022-03-01 DIAGNOSIS — E875 Hyperkalemia: Secondary | ICD-10-CM | POA: Diagnosis not present

## 2022-03-01 DIAGNOSIS — N186 End stage renal disease: Secondary | ICD-10-CM | POA: Diagnosis not present

## 2022-03-01 DIAGNOSIS — T82594A Other mechanical complication of infusion catheter, initial encounter: Secondary | ICD-10-CM | POA: Diagnosis not present

## 2022-03-01 DIAGNOSIS — Z992 Dependence on renal dialysis: Secondary | ICD-10-CM | POA: Diagnosis not present

## 2022-03-01 DIAGNOSIS — D689 Coagulation defect, unspecified: Secondary | ICD-10-CM | POA: Diagnosis not present

## 2022-03-01 DIAGNOSIS — N2581 Secondary hyperparathyroidism of renal origin: Secondary | ICD-10-CM | POA: Diagnosis not present

## 2022-03-04 DIAGNOSIS — D689 Coagulation defect, unspecified: Secondary | ICD-10-CM | POA: Diagnosis not present

## 2022-03-04 DIAGNOSIS — E875 Hyperkalemia: Secondary | ICD-10-CM | POA: Diagnosis not present

## 2022-03-04 DIAGNOSIS — Z992 Dependence on renal dialysis: Secondary | ICD-10-CM | POA: Diagnosis not present

## 2022-03-04 DIAGNOSIS — N2581 Secondary hyperparathyroidism of renal origin: Secondary | ICD-10-CM | POA: Diagnosis not present

## 2022-03-04 DIAGNOSIS — N186 End stage renal disease: Secondary | ICD-10-CM | POA: Diagnosis not present

## 2022-03-04 DIAGNOSIS — T82594A Other mechanical complication of infusion catheter, initial encounter: Secondary | ICD-10-CM | POA: Diagnosis not present

## 2022-03-06 DIAGNOSIS — N2581 Secondary hyperparathyroidism of renal origin: Secondary | ICD-10-CM | POA: Diagnosis not present

## 2022-03-06 DIAGNOSIS — E875 Hyperkalemia: Secondary | ICD-10-CM | POA: Diagnosis not present

## 2022-03-06 DIAGNOSIS — D689 Coagulation defect, unspecified: Secondary | ICD-10-CM | POA: Diagnosis not present

## 2022-03-06 DIAGNOSIS — N186 End stage renal disease: Secondary | ICD-10-CM | POA: Diagnosis not present

## 2022-03-06 DIAGNOSIS — T82594A Other mechanical complication of infusion catheter, initial encounter: Secondary | ICD-10-CM | POA: Diagnosis not present

## 2022-03-06 DIAGNOSIS — Z992 Dependence on renal dialysis: Secondary | ICD-10-CM | POA: Diagnosis not present

## 2022-03-08 DIAGNOSIS — D689 Coagulation defect, unspecified: Secondary | ICD-10-CM | POA: Diagnosis not present

## 2022-03-08 DIAGNOSIS — N186 End stage renal disease: Secondary | ICD-10-CM | POA: Diagnosis not present

## 2022-03-08 DIAGNOSIS — N2581 Secondary hyperparathyroidism of renal origin: Secondary | ICD-10-CM | POA: Diagnosis not present

## 2022-03-08 DIAGNOSIS — Z992 Dependence on renal dialysis: Secondary | ICD-10-CM | POA: Diagnosis not present

## 2022-03-08 DIAGNOSIS — E875 Hyperkalemia: Secondary | ICD-10-CM | POA: Diagnosis not present

## 2022-03-08 DIAGNOSIS — T82594A Other mechanical complication of infusion catheter, initial encounter: Secondary | ICD-10-CM | POA: Diagnosis not present

## 2022-03-11 DIAGNOSIS — T82594A Other mechanical complication of infusion catheter, initial encounter: Secondary | ICD-10-CM | POA: Diagnosis not present

## 2022-03-11 DIAGNOSIS — N186 End stage renal disease: Secondary | ICD-10-CM | POA: Diagnosis not present

## 2022-03-11 DIAGNOSIS — N2581 Secondary hyperparathyroidism of renal origin: Secondary | ICD-10-CM | POA: Diagnosis not present

## 2022-03-11 DIAGNOSIS — E875 Hyperkalemia: Secondary | ICD-10-CM | POA: Diagnosis not present

## 2022-03-11 DIAGNOSIS — Z992 Dependence on renal dialysis: Secondary | ICD-10-CM | POA: Diagnosis not present

## 2022-03-11 DIAGNOSIS — D689 Coagulation defect, unspecified: Secondary | ICD-10-CM | POA: Diagnosis not present

## 2022-03-13 DIAGNOSIS — N186 End stage renal disease: Secondary | ICD-10-CM | POA: Diagnosis not present

## 2022-03-13 DIAGNOSIS — T82594A Other mechanical complication of infusion catheter, initial encounter: Secondary | ICD-10-CM | POA: Diagnosis not present

## 2022-03-13 DIAGNOSIS — D689 Coagulation defect, unspecified: Secondary | ICD-10-CM | POA: Diagnosis not present

## 2022-03-13 DIAGNOSIS — Z992 Dependence on renal dialysis: Secondary | ICD-10-CM | POA: Diagnosis not present

## 2022-03-13 DIAGNOSIS — N2581 Secondary hyperparathyroidism of renal origin: Secondary | ICD-10-CM | POA: Diagnosis not present

## 2022-03-13 DIAGNOSIS — E875 Hyperkalemia: Secondary | ICD-10-CM | POA: Diagnosis not present

## 2022-03-14 ENCOUNTER — Ambulatory Visit (INDEPENDENT_AMBULATORY_CARE_PROVIDER_SITE_OTHER): Payer: Medicare Other

## 2022-03-14 VITALS — Ht 62.0 in | Wt 205.0 lb

## 2022-03-14 DIAGNOSIS — Z0000 Encounter for general adult medical examination without abnormal findings: Secondary | ICD-10-CM

## 2022-03-14 NOTE — Patient Instructions (Signed)
[name] , ?Thank you for taking time to come for your Medicare Wellness Visit. I appreciate your ongoing commitment to your health goals. Please review the following plan we discussed and let me know if I can assist you in the future.  ? ?Screening recommendations/referrals: ?Colonoscopy: completed 06/05/2012, due 06/05/2022 ?Mammogram: completed 10/02/2021, due 10/03/2022 ?Bone Density: decline ?Recommended yearly ophthalmology/optometry visit for glaucoma screening and checkup ?Recommended yearly dental visit for hygiene and checkup ? ?Vaccinations: ?Influenza vaccine: completed 10/10/2021, due next flu season ?Pneumococcal vaccine: completed 04/20/2021 ?Tdap vaccine: due ?Shingles vaccine: discussed   ?Covid-19: 12/22/2020, 04/07/2020, 03/10/2020 ? ?Advanced directives: Advance directive discussed with you today. Even though you declined this today please call our office should you change your mind and we can give you the proper paperwork for you to fill out. ? ?Conditions/risks identified: some smoking ? ?Next appointment: Follow up in one year for your annual wellness visit  ? ? ?Preventive Care 30 Years and Older, Female ?Preventive care refers to lifestyle choices and visits with your health care provider that can promote health and wellness. ?What does preventive care include? ?A yearly physical exam. This is also called an annual well check. ?Dental exams once or twice a year. ?Routine eye exams. Ask your health care provider how often you should have your eyes checked. ?Personal lifestyle choices, including: ?Daily care of your teeth and gums. ?Regular physical activity. ?Eating a healthy diet. ?Avoiding tobacco and drug use. ?Limiting alcohol use. ?Practicing safe sex. ?Taking low-dose aspirin every day. ?Taking vitamin and mineral supplements as recommended by your health care provider. ?What happens during an annual well check? ?The services and screenings done by your health care provider during your annual  well check will depend on your age, overall health, lifestyle risk factors, and family history of disease. ?Counseling  ?Your health care provider may ask you questions about your: ?Alcohol use. ?Tobacco use. ?Drug use. ?Emotional well-being. ?Home and relationship well-being. ?Sexual activity. ?Eating habits. ?History of falls. ?Memory and ability to understand (cognition). ?Work and work Statistician. ?Reproductive health. ?Screening  ?You may have the following tests or measurements: ?Height, weight, and BMI. ?Blood pressure. ?Lipid and cholesterol levels. These may be checked every 5 years, or more frequently if you are over 41 years old. ?Skin check. ?Lung cancer screening. You may have this screening every year starting at age 86 if you have a 30-pack-year history of smoking and currently smoke or have quit within the past 15 years. ?Fecal occult blood test (FOBT) of the stool. You may have this test every year starting at age 62. ?Flexible sigmoidoscopy or colonoscopy. You may have a sigmoidoscopy every 5 years or a colonoscopy every 10 years starting at age 15. ?Hepatitis C blood test. ?Hepatitis B blood test. ?Sexually transmitted disease (STD) testing. ?Diabetes screening. This is done by checking your blood sugar (glucose) after you have not eaten for a while (fasting). You may have this done every 1-3 years. ?Bone density scan. This is done to screen for osteoporosis. You may have this done starting at age 57. ?Mammogram. This may be done every 1-2 years. Talk to your health care provider about how often you should have regular mammograms. ?Talk with your health care provider about your test results, treatment options, and if necessary, the need for more tests. ?Vaccines  ?Your health care provider may recommend certain vaccines, such as: ?Influenza vaccine. This is recommended every year. ?Tetanus, diphtheria, and acellular pertussis (Tdap, Td) vaccine. You may need a  Td booster every 10 years. ?Zoster  vaccine. You may need this after age 68. ?Pneumococcal 13-valent conjugate (PCV13) vaccine. One dose is recommended after age 56. ?Pneumococcal polysaccharide (PPSV23) vaccine. One dose is recommended after age 24. ?Talk to your health care provider about which screenings and vaccines you need and how often you need them. ?This information is not intended to replace advice given to you by your health care provider. Make sure you discuss any questions you have with your health care provider. ?Document Released: 01/12/2016 Document Revised: 09/04/2016 Document Reviewed: 10/17/2015 ?Elsevier Interactive Patient Education ? 2017 [place]. ? ?Fall Prevention in the Home ?Falls can cause injuries. They can happen to people of all ages. There are many things you can do to make your home safe and to help prevent falls. ?What can I do on the outside of my home? ?Regularly fix the edges of walkways and driveways and fix any cracks. ?Remove anything that might make you trip as you walk through a door, such as a raised step or threshold. ?Trim any bushes or trees on the path to your home. ?Use bright outdoor lighting. ?Clear any walking paths of anything that might make someone trip, such as rocks or tools. ?Regularly check to see if handrails are loose or broken. Make sure that both sides of any steps have handrails. ?Any raised decks and porches should have guardrails on the edges. ?Have any leaves, snow, or ice cleared regularly. ?Use sand or salt on walking paths during winter. ?Clean up any spills in your garage right away. This includes oil or grease spills. ?What can I do in the bathroom? ?Use night lights. ?Install grab bars by the toilet and in the tub and shower. Do not use towel bars as grab bars. ?Use non-skid mats or decals in the tub or shower. ?If you need to sit down in the shower, use a plastic, non-slip stool. ?Keep the floor dry. Clean up any water that spills on the floor as soon as it happens. ?Remove  soap buildup in the tub or shower regularly. ?Attach bath mats securely with double-sided non-slip rug tape. ?Do not have throw rugs and other things on the floor that can make you trip. ?What can I do in the bedroom? ?Use night lights. ?Make sure that you have a light by your bed that is easy to reach. ?Do not use any sheets or blankets that are too big for your bed. They should not hang down onto the floor. ?Have a firm chair that has side arms. You can use this for support while you get dressed. ?Do not have throw rugs and other things on the floor that can make you trip. ?What can I do in the kitchen? ?Clean up any spills right away. ?Avoid walking on wet floors. ?Keep items that you use a lot in easy-to-reach places. ?If you need to reach something above you, use a strong step stool that has a grab bar. ?Keep electrical cords out of the way. ?Do not use floor polish or wax that makes floors slippery. If you must use wax, use non-skid floor wax. ?Do not have throw rugs and other things on the floor that can make you trip. ?What can I do with my stairs? ?Do not leave any items on the stairs. ?Make sure that there are handrails on both sides of the stairs and use them. Fix handrails that are broken or loose. Make sure that handrails are as long as the stairways. ?Check any  carpeting to make sure that it is firmly attached to the stairs. Fix any carpet that is loose or worn. ?Avoid having throw rugs at the top or bottom of the stairs. If you do have throw rugs, attach them to the floor with carpet tape. ?Make sure that you have a light switch at the top of the stairs and the bottom of the stairs. If you do not have them, ask someone to add them for you. ?What else can I do to help prevent falls? ?Wear shoes that: ?Do not have high heels. ?Have rubber bottoms. ?Are comfortable and fit you well. ?Are closed at the toe. Do not wear sandals. ?If you use a stepladder: ?Make sure that it is fully opened. Do not climb a  closed stepladder. ?Make sure that both sides of the stepladder are locked into place. ?Ask someone to hold it for you, if possible. ?Clearly mark and make sure that you can see: ?Any grab bars or handra

## 2022-03-14 NOTE — Progress Notes (Signed)
?I connected with Whitney Walker today by telephone and verified that I am speaking with the correct person using two identifiers. ?Location patient: home ?Location provider: work ?Persons participating in the virtual visit: Gelila, Well LPN. ?  ?I discussed the limitations, risks, security and privacy concerns of performing an evaluation and management service by telephone and the availability of in person appointments. I also discussed with the patient that there may be a patient responsible charge related to this service. The patient expressed understanding and verbally consented to this telephonic visit.  ?  ?Interactive audio and video telecommunications were attempted between this provider and patient, however failed, due to patient having technical difficulties OR patient did not have access to video capability.  We continued and completed visit with audio only. ? ?  ? ?Vital signs may be patient reported or missing. ? ?Subjective:  ? Whitney Walker is a 66 y.o. female who presents for Medicare Annual (Subsequent) preventive examination. ? ?Review of Systems    ? ?Cardiac Risk Factors include: advanced age (>27mn, >>92women);hypertension;obesity (BMI >30kg/m2);smoking/ tobacco exposure ? ?   ?Objective:  ?  ?Today's Vitals  ? 03/14/22 1100  ?Weight: 205 lb (93 kg)  ?Height: '5\' 2"'$  (1.575 m)  ? ?Body mass index is 37.49 kg/m?. ? ?Advanced Directives 03/14/2022 08/09/2021 07/16/2021 05/28/2021 05/01/2021 11/01/2020 10/30/2020  ?Does Patient Have a Medical Advance Directive? No No No No No Yes No  ?Type of Advance Directive - - - - - HPress photographerLiving will -  ?Does patient want to make changes to medical advance directive? - - - - - No - Patient declined -  ?Copy of HMabelin Chart? - - - - - No - copy requested -  ?Would patient like information on creating a medical advance directive? No - Patient declined No - Patient declined No - Patient declined No -  Patient declined No - Patient declined - -  ?Pre-existing out of facility DNR order (yellow form or pink MOST form) - - - - - - -  ? ? ?Current Medications (verified) ?Outpatient Encounter Medications as of 03/14/2022  ?Medication Sig  ? acetaminophen (TYLENOL) 500 MG tablet Take 1,000 mg by mouth 2 (two) times daily as needed for moderate pain or headache.  ? albuterol (VENTOLIN HFA) 108 (90 Base) MCG/ACT inhaler Inhale 2 puffs into the lungs every 6 (six) hours as needed for wheezing or shortness of breath.  ? Ascorbic Acid (VITAMIN C) 100 MG tablet Take 100 mg by mouth daily.  ? atorvastatin (LIPITOR) 10 MG tablet Take 1 tablet (10 mg total) by mouth daily.  ? AURYXIA 1 GM 210 MG(Fe) tablet Take 420-840 mg by mouth with breakfast, with lunch, and with evening meal. Taking  2 tablets daily ('420mg'$ ) with each snack  ? Biotin 10000 MCG TABS Take 10,000 mcg by mouth daily.   ? carvedilol (COREG) 12.5 MG tablet Take 1 tablet (12.5 mg total) by mouth 2 (two) times daily.  ? cyclobenzaprine (FLEXERIL) 10 MG tablet Take 1 tablet (10 mg total) by mouth 3 (three) times daily as needed for muscle spasms.  ? ELIQUIS 2.5 MG TABS tablet TAKE ONE TABLET BY MOUTH TWICE DAILY  ? meclizine (ANTIVERT) 12.5 MG tablet Take 1 tablet (12.5 mg total) by mouth 3 (three) times daily as needed for dizziness.  ? mupirocin ointment (BACTROBAN) 2 % Apply 1 application topically daily as needed (skin bumps).  ? oxyCODONE-acetaminophen (PERCOCET) 5-325 MG tablet  Take 1 tablet by mouth every 4 (four) hours as needed for severe pain.  ? vitamin E 200 UNIT capsule Take 200 Units by mouth daily.  ? XANAX 0.5 MG tablet Take 1 tablet by mouth as needed.  ? omeprazole (PRILOSEC) 20 MG capsule Take 1 capsule (20 mg total) by mouth daily.  ? ?No facility-administered encounter medications on file as of 03/14/2022.  ? ? ?Allergies (verified) ?Carnosine, Gadolinium derivatives, Iohexol, Iodinated contrast media, Iodine, and Naltrexone  ? ?History: ?Past  Medical History:  ?Diagnosis Date  ? Anemia   ? Anxiety   ? CHF (congestive heart failure) ([place])   ? Colon cancer ([place])   ? treatment surgery  ? Complication of anesthesia   ? after first C- [place] "couldnt walk after", patient denies having a spinal  ? COPD (chronic obstructive pulmonary disease) ([place])   ? Coronary artery disease   ? Depression   ? DVT (deep venous thrombosis) ([place])   ? ESRD (end stage renal disease) ([place])   ? Hemo: MWF  ? History of blood transfusion 04/2018  ? Hypertension   ? 07/07/18- no longer takes BP medications  ? Meningitis   ? Pain in limb 07/30/2013  ? PE (pulmonary embolism)   ? Peripheral vascular disease ([place])   ? Restless legs   ? Shortness of breath   ? with exertion  ? Sleep apnea   ? SOB (shortness of breath) 03/03/2019  ? Vertigo   ? ?Past Surgical History:  ?Procedure Laterality Date  ? ABDOMINAL AORTAGRAM N/A 07/26/2013  ? Procedure: ABDOMINAL AORTAGRAM;  Surgeon: [name] [place], MD;  Location: [facility] CATH LAB;  Service: Cardiovascular;  Laterality: N/A;  ? ABDOMINAL HYSTERECTOMY    ? AV FISTULA PLACEMENT Left 05/05/2018  ? Procedure: ARTERIOVENOUS (AV) FISTULA CREATION BRACHIOCEPHALIC;  Surgeon: Waynetta Sandy, MD;  Location: [facility];  Service: Vascular;  Laterality: Left;  ? AV FISTULA PLACEMENT Left 07/16/2018  ? Procedure: ARTERIOVENOUS FISTULA CREATION;  Surgeon: Waynetta Sandy, MD;  Location: [facility];  Service: Vascular;  Laterality: Left;  ? BASCILIC VEIN TRANSPOSITION Left 09/03/2018  ? Procedure: BASILIC VEIN TRANSPOSITION SECOND STAGE;  Surgeon: Waynetta Sandy, MD;  Location: [facility];  Service: Vascular;  Laterality: Left;  ? BREAST BIOPSY Left   ? CESAREAN SECTION    ? X 3 [id number]  ? CHOLECYSTECTOMY    ? CHOLECYSTECTOMY  1980/s  ? COLECTOMY  2010  ? DIVERTICULOSIS SURGERY-2002  2012  ? FEMORAL-POPLITEAL BYPASS GRAFT Left 08/13/2013  ? Procedure: BYPASS GRAFT FEMORAL-POPLITEAL ARTERY WITH NON-REVERSED SAPHANEOUS VEIN; ULTRASOUND GUIDED;  Surgeon: Mal [name], MD;  Location: [facility];  Service: Vascular;  Laterality: Left;  ? INSERTION OF DIALYSIS CATHETER Right 04/30/2018  ? Procedure: INSERTION OF Right Internal Jugular DIALYSIS CATHETER;  Surgeon: [name], MD;  Location: [facility] OR;  Service: Vascular;  Laterality: Right;  ? IR FLUORO GUIDE CV LINE RIGHT  07/24/2020  ? IR FLUORO GUIDE CV LINE RIGHT  07/21/2021  ? IR REMOVAL TUN CV CATH W/O FL  07/21/2020  ? IR REMOVAL TUN CV CATH W/O FL  07/19/2021  ? IR US GUIDE VASC ACCESS RIGHT  07/24/2020  ? IR US GUIDE VASC ACCESS RIGHT  07/21/2021  ? LEFT HEART CATH AND CORONARY ANGIOGRAPHY N/A 05/01/2021  ? Procedure: LEFT HEART CATH AND CORONARY ANGIOGRAPHY;  Surgeon: [name], MD;  Location: [facility] CV LAB;  Service: Cardiovascular;  Laterality: N/A;  ? LOWER EXTREMITY ANGIOGRAM Left 07/26/2013  ?  Procedure: LOWER EXTREMITY ANGIOGRAM;  Surgeon: [name] [place], MD;  Location: [facility] CATH LAB;  Service: Cardiovascular;  Laterality: Left;  ? ?Family History  ?Problem Relation Age of Onset  ? Cancer Mother 2  ?     breast and bone  ? Cancer Father 36  ?     prostate  ? Hypertension Sister   ? Bleeding Disorder Sister   ? Cancer Cousin 20  ?     breast cancer   ? Hypertension Daughter   ? Breast cancer Neg Hx   ? ?Social History  ? ?Socioeconomic History  ? Marital status: Single  ?  Spouse name: Not on file  ? Number of children: 4  ? Years of education: 12th gd  ? Highest education level: Not on file  ?Occupational History  ? Occupation: N/A  ?Tobacco Use  ? Smoking status: Some Days  ?  Packs/day: 0.50  ?  Years: 50.00  ?  Pack years: 25.00  ?  Types: Cigarettes  ?  Last attempt to quit: 02/03/2021  ?  Years since quitting: 1.1  ? Smokeless tobacco: Former  ? Tobacco comments:  ?  She is using a vape  ?Vaping Use  ? Vaping Use: Every day  ? Devices: Vuse  ?Substance and Sexual Activity  ? Alcohol use: No  ?  Alcohol/week: 0.0 standard drinks  ?  Comment: she used to drink alcohol, quit in 2010; h/o heavy use   ? Drug use: Not  Currently  ?  Types: Oxycodone  ? Sexual activity: Not Currently  ?Other Topics Concern  ? Not on file  ?Social History Narrative  ? 2 cups of coffee a day   ? ?Social Determinants of Health  ? ?Financial Resour

## 2022-03-15 DIAGNOSIS — D689 Coagulation defect, unspecified: Secondary | ICD-10-CM | POA: Diagnosis not present

## 2022-03-15 DIAGNOSIS — N2581 Secondary hyperparathyroidism of renal origin: Secondary | ICD-10-CM | POA: Diagnosis not present

## 2022-03-15 DIAGNOSIS — N186 End stage renal disease: Secondary | ICD-10-CM | POA: Diagnosis not present

## 2022-03-15 DIAGNOSIS — T82594A Other mechanical complication of infusion catheter, initial encounter: Secondary | ICD-10-CM | POA: Diagnosis not present

## 2022-03-15 DIAGNOSIS — E875 Hyperkalemia: Secondary | ICD-10-CM | POA: Diagnosis not present

## 2022-03-15 DIAGNOSIS — Z992 Dependence on renal dialysis: Secondary | ICD-10-CM | POA: Diagnosis not present

## 2022-03-18 DIAGNOSIS — D689 Coagulation defect, unspecified: Secondary | ICD-10-CM | POA: Diagnosis not present

## 2022-03-18 DIAGNOSIS — E875 Hyperkalemia: Secondary | ICD-10-CM | POA: Diagnosis not present

## 2022-03-18 DIAGNOSIS — Z992 Dependence on renal dialysis: Secondary | ICD-10-CM | POA: Diagnosis not present

## 2022-03-18 DIAGNOSIS — N2581 Secondary hyperparathyroidism of renal origin: Secondary | ICD-10-CM | POA: Diagnosis not present

## 2022-03-18 DIAGNOSIS — T82594A Other mechanical complication of infusion catheter, initial encounter: Secondary | ICD-10-CM | POA: Diagnosis not present

## 2022-03-18 DIAGNOSIS — N186 End stage renal disease: Secondary | ICD-10-CM | POA: Diagnosis not present

## 2022-03-20 DIAGNOSIS — E875 Hyperkalemia: Secondary | ICD-10-CM | POA: Diagnosis not present

## 2022-03-20 DIAGNOSIS — T82594A Other mechanical complication of infusion catheter, initial encounter: Secondary | ICD-10-CM | POA: Diagnosis not present

## 2022-03-20 DIAGNOSIS — Z992 Dependence on renal dialysis: Secondary | ICD-10-CM | POA: Diagnosis not present

## 2022-03-20 DIAGNOSIS — D689 Coagulation defect, unspecified: Secondary | ICD-10-CM | POA: Diagnosis not present

## 2022-03-20 DIAGNOSIS — N2581 Secondary hyperparathyroidism of renal origin: Secondary | ICD-10-CM | POA: Diagnosis not present

## 2022-03-20 DIAGNOSIS — N186 End stage renal disease: Secondary | ICD-10-CM | POA: Diagnosis not present

## 2022-03-22 DIAGNOSIS — N2581 Secondary hyperparathyroidism of renal origin: Secondary | ICD-10-CM | POA: Diagnosis not present

## 2022-03-22 DIAGNOSIS — Z992 Dependence on renal dialysis: Secondary | ICD-10-CM | POA: Diagnosis not present

## 2022-03-22 DIAGNOSIS — E875 Hyperkalemia: Secondary | ICD-10-CM | POA: Diagnosis not present

## 2022-03-22 DIAGNOSIS — D689 Coagulation defect, unspecified: Secondary | ICD-10-CM | POA: Diagnosis not present

## 2022-03-22 DIAGNOSIS — N186 End stage renal disease: Secondary | ICD-10-CM | POA: Diagnosis not present

## 2022-03-22 DIAGNOSIS — T82594A Other mechanical complication of infusion catheter, initial encounter: Secondary | ICD-10-CM | POA: Diagnosis not present

## 2022-03-25 DIAGNOSIS — N2581 Secondary hyperparathyroidism of renal origin: Secondary | ICD-10-CM | POA: Diagnosis not present

## 2022-03-25 DIAGNOSIS — E875 Hyperkalemia: Secondary | ICD-10-CM | POA: Diagnosis not present

## 2022-03-25 DIAGNOSIS — T82594A Other mechanical complication of infusion catheter, initial encounter: Secondary | ICD-10-CM | POA: Diagnosis not present

## 2022-03-25 DIAGNOSIS — N186 End stage renal disease: Secondary | ICD-10-CM | POA: Diagnosis not present

## 2022-03-25 DIAGNOSIS — D689 Coagulation defect, unspecified: Secondary | ICD-10-CM | POA: Diagnosis not present

## 2022-03-25 DIAGNOSIS — Z992 Dependence on renal dialysis: Secondary | ICD-10-CM | POA: Diagnosis not present

## 2022-03-27 ENCOUNTER — Other Ambulatory Visit: Payer: Self-pay

## 2022-03-27 ENCOUNTER — Other Ambulatory Visit: Payer: Self-pay | Admitting: Hematology

## 2022-03-27 DIAGNOSIS — Z992 Dependence on renal dialysis: Secondary | ICD-10-CM | POA: Diagnosis not present

## 2022-03-27 DIAGNOSIS — K219 Gastro-esophageal reflux disease without esophagitis: Secondary | ICD-10-CM

## 2022-03-27 DIAGNOSIS — D689 Coagulation defect, unspecified: Secondary | ICD-10-CM | POA: Diagnosis not present

## 2022-03-27 DIAGNOSIS — N186 End stage renal disease: Secondary | ICD-10-CM | POA: Diagnosis not present

## 2022-03-27 DIAGNOSIS — E875 Hyperkalemia: Secondary | ICD-10-CM | POA: Diagnosis not present

## 2022-03-27 DIAGNOSIS — T82594A Other mechanical complication of infusion catheter, initial encounter: Secondary | ICD-10-CM | POA: Diagnosis not present

## 2022-03-27 DIAGNOSIS — N2581 Secondary hyperparathyroidism of renal origin: Secondary | ICD-10-CM | POA: Diagnosis not present

## 2022-03-28 ENCOUNTER — Other Ambulatory Visit: Payer: Self-pay

## 2022-03-28 ENCOUNTER — Telehealth: Payer: Self-pay

## 2022-03-28 DIAGNOSIS — K219 Gastro-esophageal reflux disease without esophagitis: Secondary | ICD-10-CM

## 2022-03-28 MED ORDER — OMEPRAZOLE 20 MG PO CPDR: 20.0000 mg | DELAYED_RELEASE_CAPSULE | Freq: Every day | ORAL | 0 refills | Status: DC

## 2022-03-28 NOTE — Chronic Care Management (AMB) (Signed)
? ? ?  Chronic Care Management ?Pharmacy Assistant  ? ?Name: Amberrose Friebel  MRN: [id number] DOB: 1956/07/02 ? ?Reason for Encounter: Medication Review/ Medication Coordination ? ?Recent office visits:  ?03-14-2022 Kellie Simmering, LPN. Medicare annual wellness visit. ? ?02-21-2022 Little, Claudette Stapler, RN (CCM) ? ?Recent consult visits:  ?02-19-2021 Verneda Skill (Cardiology). STOP Multivitamin, zetia and lidocaine patch. ? ?Hospital visits:  ?None in previous 6 months ? ?Medications: ?Outpatient Encounter Medications as of 03/28/2022  ?Medication Sig  ? acetaminophen (TYLENOL) 500 MG tablet Take 1,000 mg by mouth 2 (two) times daily as needed for moderate pain or headache.  ? albuterol (VENTOLIN HFA) 108 (90 Base) MCG/ACT inhaler Inhale 2 puffs into the lungs every 6 (six) hours as needed for wheezing or shortness of breath.  ? Ascorbic Acid (VITAMIN C) 100 MG tablet Take 100 mg by mouth daily.  ? atorvastatin (LIPITOR) 10 MG tablet Take 1 tablet (10 mg total) by mouth daily.  ? AURYXIA 1 GM 210 MG(Fe) tablet Take 420-840 mg by mouth with breakfast, with lunch, and with evening meal. Taking  2 tablets daily ('420mg'$ ) with each snack  ? Biotin 10000 MCG TABS Take 10,000 mcg by mouth daily.   ? carvedilol (COREG) 12.5 MG tablet Take 1 tablet (12.5 mg total) by mouth 2 (two) times daily.  ? cyclobenzaprine (FLEXERIL) 10 MG tablet Take 1 tablet (10 mg total) by mouth 3 (three) times daily as needed for muscle spasms.  ? ELIQUIS 2.5 MG TABS tablet TAKE ONE TABLET BY MOUTH TWICE DAILY  ? meclizine (ANTIVERT) 12.5 MG tablet Take 1 tablet (12.5 mg total) by mouth 3 (three) times daily as needed for dizziness.  ? mupirocin ointment (BACTROBAN) 2 % Apply 1 application topically daily as needed (skin bumps).  ? omeprazole (PRILOSEC) 20 MG capsule Take 1 capsule (20 mg total) by mouth daily.  ? oxyCODONE-acetaminophen (PERCOCET) 5-325 MG tablet Take 1 tablet by mouth every 4 (four) hours as needed for severe pain.  ?  vitamin E 200 UNIT capsule Take 200 Units by mouth daily.  ? XANAX 0.5 MG tablet Take 1 tablet by mouth as needed.  ? ?No facility-administered encounter medications on file as of 03/28/2022.  ?Reviewed chart for medication changes ahead of medication coordination call. ? ?No hospital visits since last care coordination call/Pharmacist visit. ? ?No medication changes indicated OR if recent visit, treatment plan here. ? ?BP Readings from Last 3 Encounters:  ?02/19/22 138/68  ?11/27/21 (!) 125/59  ?11/15/21 110/62  ?  ?Lab Results  ?Component Value Date  ? HGBA1C 5.9 (H) 11/15/2021  ?  ? ?Patient obtains medications through Vials  90 Days  ? ?Last adherence delivery included:  ?None ? ?Patient declined (meds) last month: ?None ? ?Patient is due for next adherence delivery on: 04-09-2022 ? ?Called patient and reviewed medications and coordinated delivery. ? ?This delivery to include: ?Carvedilol 12.5 mg twice daily ?Prilosec 20 mg daily ? ?Acute completed for eliquis  ? ?Patient declined the following medications: ?Eliquis- Isn't due until 04-23-2022 ?Atorvastatin- Isn't due until 05-21-2022 ? ?Patient needs refills for: Refill request sent to Dr. Burr Medico ?Prilosec ? ?Confirmed delivery date of 04-09-2022, advised patient that pharmacy will contact them the morning of delivery. ? ?Care Gaps: ?Shingrix overdue ?Covid booster overdue ?AWV 03-14-2022 ? ?Star Rating Drugs: ?Atorvastatin 10 mg- Last filled 02-19-2022 30 DS [facility] ? ?[name] CMA ?Clinical Pharmacist Assistant ?[phone] ? ?

## 2022-03-29 DIAGNOSIS — Z992 Dependence on renal dialysis: Secondary | ICD-10-CM | POA: Diagnosis not present

## 2022-03-29 DIAGNOSIS — N186 End stage renal disease: Secondary | ICD-10-CM | POA: Diagnosis not present

## 2022-03-29 DIAGNOSIS — E875 Hyperkalemia: Secondary | ICD-10-CM | POA: Diagnosis not present

## 2022-03-29 DIAGNOSIS — I129 Hypertensive chronic kidney disease with stage 1 through stage 4 chronic kidney disease, or unspecified chronic kidney disease: Secondary | ICD-10-CM | POA: Diagnosis not present

## 2022-03-29 DIAGNOSIS — N2581 Secondary hyperparathyroidism of renal origin: Secondary | ICD-10-CM | POA: Diagnosis not present

## 2022-03-29 DIAGNOSIS — D689 Coagulation defect, unspecified: Secondary | ICD-10-CM | POA: Diagnosis not present

## 2022-04-01 DIAGNOSIS — D689 Coagulation defect, unspecified: Secondary | ICD-10-CM | POA: Diagnosis not present

## 2022-04-01 DIAGNOSIS — E875 Hyperkalemia: Secondary | ICD-10-CM | POA: Diagnosis not present

## 2022-04-01 DIAGNOSIS — N186 End stage renal disease: Secondary | ICD-10-CM | POA: Diagnosis not present

## 2022-04-01 DIAGNOSIS — Z992 Dependence on renal dialysis: Secondary | ICD-10-CM | POA: Diagnosis not present

## 2022-04-01 DIAGNOSIS — N2581 Secondary hyperparathyroidism of renal origin: Secondary | ICD-10-CM | POA: Diagnosis not present

## 2022-04-03 DIAGNOSIS — D689 Coagulation defect, unspecified: Secondary | ICD-10-CM | POA: Diagnosis not present

## 2022-04-03 DIAGNOSIS — N186 End stage renal disease: Secondary | ICD-10-CM | POA: Diagnosis not present

## 2022-04-03 DIAGNOSIS — N2581 Secondary hyperparathyroidism of renal origin: Secondary | ICD-10-CM | POA: Diagnosis not present

## 2022-04-03 DIAGNOSIS — Z992 Dependence on renal dialysis: Secondary | ICD-10-CM | POA: Diagnosis not present

## 2022-04-03 DIAGNOSIS — E875 Hyperkalemia: Secondary | ICD-10-CM | POA: Diagnosis not present

## 2022-04-04 DIAGNOSIS — H2512 Age-related nuclear cataract, left eye: Secondary | ICD-10-CM | POA: Diagnosis not present

## 2022-04-05 DIAGNOSIS — Z992 Dependence on renal dialysis: Secondary | ICD-10-CM | POA: Diagnosis not present

## 2022-04-05 DIAGNOSIS — N186 End stage renal disease: Secondary | ICD-10-CM | POA: Diagnosis not present

## 2022-04-05 DIAGNOSIS — D689 Coagulation defect, unspecified: Secondary | ICD-10-CM | POA: Diagnosis not present

## 2022-04-05 DIAGNOSIS — E875 Hyperkalemia: Secondary | ICD-10-CM | POA: Diagnosis not present

## 2022-04-05 DIAGNOSIS — N2581 Secondary hyperparathyroidism of renal origin: Secondary | ICD-10-CM | POA: Diagnosis not present

## 2022-04-08 DIAGNOSIS — Z992 Dependence on renal dialysis: Secondary | ICD-10-CM | POA: Diagnosis not present

## 2022-04-08 DIAGNOSIS — E875 Hyperkalemia: Secondary | ICD-10-CM | POA: Diagnosis not present

## 2022-04-08 DIAGNOSIS — D689 Coagulation defect, unspecified: Secondary | ICD-10-CM | POA: Diagnosis not present

## 2022-04-08 DIAGNOSIS — N2581 Secondary hyperparathyroidism of renal origin: Secondary | ICD-10-CM | POA: Diagnosis not present

## 2022-04-08 DIAGNOSIS — N186 End stage renal disease: Secondary | ICD-10-CM | POA: Diagnosis not present

## 2022-04-10 DIAGNOSIS — N186 End stage renal disease: Secondary | ICD-10-CM | POA: Diagnosis not present

## 2022-04-10 DIAGNOSIS — E875 Hyperkalemia: Secondary | ICD-10-CM | POA: Diagnosis not present

## 2022-04-10 DIAGNOSIS — N2581 Secondary hyperparathyroidism of renal origin: Secondary | ICD-10-CM | POA: Diagnosis not present

## 2022-04-10 DIAGNOSIS — D689 Coagulation defect, unspecified: Secondary | ICD-10-CM | POA: Diagnosis not present

## 2022-04-10 DIAGNOSIS — Z992 Dependence on renal dialysis: Secondary | ICD-10-CM | POA: Diagnosis not present

## 2022-04-12 DIAGNOSIS — N2581 Secondary hyperparathyroidism of renal origin: Secondary | ICD-10-CM | POA: Diagnosis not present

## 2022-04-12 DIAGNOSIS — Z992 Dependence on renal dialysis: Secondary | ICD-10-CM | POA: Diagnosis not present

## 2022-04-12 DIAGNOSIS — N186 End stage renal disease: Secondary | ICD-10-CM | POA: Diagnosis not present

## 2022-04-12 DIAGNOSIS — D689 Coagulation defect, unspecified: Secondary | ICD-10-CM | POA: Diagnosis not present

## 2022-04-12 DIAGNOSIS — E875 Hyperkalemia: Secondary | ICD-10-CM | POA: Diagnosis not present

## 2022-04-13 ENCOUNTER — Other Ambulatory Visit: Payer: Self-pay | Admitting: Nurse Practitioner

## 2022-04-13 DIAGNOSIS — I82412 Acute embolism and thrombosis of left femoral vein: Secondary | ICD-10-CM

## 2022-04-15 DIAGNOSIS — N2581 Secondary hyperparathyroidism of renal origin: Secondary | ICD-10-CM | POA: Diagnosis not present

## 2022-04-15 DIAGNOSIS — Z992 Dependence on renal dialysis: Secondary | ICD-10-CM | POA: Diagnosis not present

## 2022-04-15 DIAGNOSIS — D689 Coagulation defect, unspecified: Secondary | ICD-10-CM | POA: Diagnosis not present

## 2022-04-15 DIAGNOSIS — E875 Hyperkalemia: Secondary | ICD-10-CM | POA: Diagnosis not present

## 2022-04-15 DIAGNOSIS — N186 End stage renal disease: Secondary | ICD-10-CM | POA: Diagnosis not present

## 2022-04-17 DIAGNOSIS — N2581 Secondary hyperparathyroidism of renal origin: Secondary | ICD-10-CM | POA: Diagnosis not present

## 2022-04-17 DIAGNOSIS — E875 Hyperkalemia: Secondary | ICD-10-CM | POA: Diagnosis not present

## 2022-04-17 DIAGNOSIS — D689 Coagulation defect, unspecified: Secondary | ICD-10-CM | POA: Diagnosis not present

## 2022-04-17 DIAGNOSIS — N186 End stage renal disease: Secondary | ICD-10-CM | POA: Diagnosis not present

## 2022-04-17 DIAGNOSIS — Z992 Dependence on renal dialysis: Secondary | ICD-10-CM | POA: Diagnosis not present

## 2022-04-19 DIAGNOSIS — Z992 Dependence on renal dialysis: Secondary | ICD-10-CM | POA: Diagnosis not present

## 2022-04-19 DIAGNOSIS — D689 Coagulation defect, unspecified: Secondary | ICD-10-CM | POA: Diagnosis not present

## 2022-04-19 DIAGNOSIS — E875 Hyperkalemia: Secondary | ICD-10-CM | POA: Diagnosis not present

## 2022-04-19 DIAGNOSIS — N2581 Secondary hyperparathyroidism of renal origin: Secondary | ICD-10-CM | POA: Diagnosis not present

## 2022-04-19 DIAGNOSIS — N186 End stage renal disease: Secondary | ICD-10-CM | POA: Diagnosis not present

## 2022-04-22 DIAGNOSIS — N2581 Secondary hyperparathyroidism of renal origin: Secondary | ICD-10-CM | POA: Diagnosis not present

## 2022-04-22 DIAGNOSIS — N186 End stage renal disease: Secondary | ICD-10-CM | POA: Diagnosis not present

## 2022-04-22 DIAGNOSIS — Z992 Dependence on renal dialysis: Secondary | ICD-10-CM | POA: Diagnosis not present

## 2022-04-22 DIAGNOSIS — D689 Coagulation defect, unspecified: Secondary | ICD-10-CM | POA: Diagnosis not present

## 2022-04-22 DIAGNOSIS — E875 Hyperkalemia: Secondary | ICD-10-CM | POA: Diagnosis not present

## 2022-04-24 DIAGNOSIS — Z992 Dependence on renal dialysis: Secondary | ICD-10-CM | POA: Diagnosis not present

## 2022-04-24 DIAGNOSIS — N2581 Secondary hyperparathyroidism of renal origin: Secondary | ICD-10-CM | POA: Diagnosis not present

## 2022-04-24 DIAGNOSIS — E875 Hyperkalemia: Secondary | ICD-10-CM | POA: Diagnosis not present

## 2022-04-24 DIAGNOSIS — N186 End stage renal disease: Secondary | ICD-10-CM | POA: Diagnosis not present

## 2022-04-24 DIAGNOSIS — D689 Coagulation defect, unspecified: Secondary | ICD-10-CM | POA: Diagnosis not present

## 2022-04-26 ENCOUNTER — Other Ambulatory Visit: Payer: Self-pay | Admitting: Hematology

## 2022-04-26 DIAGNOSIS — K219 Gastro-esophageal reflux disease without esophagitis: Secondary | ICD-10-CM

## 2022-04-26 DIAGNOSIS — Z992 Dependence on renal dialysis: Secondary | ICD-10-CM | POA: Diagnosis not present

## 2022-04-26 DIAGNOSIS — D689 Coagulation defect, unspecified: Secondary | ICD-10-CM | POA: Diagnosis not present

## 2022-04-26 DIAGNOSIS — E875 Hyperkalemia: Secondary | ICD-10-CM | POA: Diagnosis not present

## 2022-04-26 DIAGNOSIS — N186 End stage renal disease: Secondary | ICD-10-CM | POA: Diagnosis not present

## 2022-04-26 DIAGNOSIS — N2581 Secondary hyperparathyroidism of renal origin: Secondary | ICD-10-CM | POA: Diagnosis not present

## 2022-04-28 DIAGNOSIS — I129 Hypertensive chronic kidney disease with stage 1 through stage 4 chronic kidney disease, or unspecified chronic kidney disease: Secondary | ICD-10-CM | POA: Diagnosis not present

## 2022-04-28 DIAGNOSIS — Z992 Dependence on renal dialysis: Secondary | ICD-10-CM | POA: Diagnosis not present

## 2022-04-28 DIAGNOSIS — N186 End stage renal disease: Secondary | ICD-10-CM | POA: Diagnosis not present

## 2022-04-29 ENCOUNTER — Emergency Department (HOSPITAL_COMMUNITY): Payer: Medicare Other

## 2022-04-29 ENCOUNTER — Telehealth: Payer: Self-pay

## 2022-04-29 ENCOUNTER — Emergency Department (HOSPITAL_COMMUNITY)
Admission: EM | Admit: 2022-04-29 | Discharge: 2022-04-30 | Discharge status: Against Medical Advice | Payer: Medicare Other | Source: Home / Self Care

## 2022-04-29 DIAGNOSIS — R059 Cough, unspecified: Secondary | ICD-10-CM | POA: Diagnosis not present

## 2022-04-29 DIAGNOSIS — E875 Hyperkalemia: Secondary | ICD-10-CM | POA: Diagnosis not present

## 2022-04-29 DIAGNOSIS — J449 Chronic obstructive pulmonary disease, unspecified: Secondary | ICD-10-CM | POA: Diagnosis not present

## 2022-04-29 DIAGNOSIS — F17210 Nicotine dependence, cigarettes, uncomplicated: Secondary | ICD-10-CM | POA: Diagnosis not present

## 2022-04-29 DIAGNOSIS — I455 Other specified heart block: Secondary | ICD-10-CM | POA: Diagnosis not present

## 2022-04-29 DIAGNOSIS — D6859 Other primary thrombophilia: Secondary | ICD-10-CM | POA: Diagnosis not present

## 2022-04-29 DIAGNOSIS — N2581 Secondary hyperparathyroidism of renal origin: Secondary | ICD-10-CM | POA: Diagnosis not present

## 2022-04-29 DIAGNOSIS — D689 Coagulation defect, unspecified: Secondary | ICD-10-CM | POA: Diagnosis not present

## 2022-04-29 DIAGNOSIS — D631 Anemia in chronic kidney disease: Secondary | ICD-10-CM | POA: Diagnosis not present

## 2022-04-29 DIAGNOSIS — Z992 Dependence on renal dialysis: Secondary | ICD-10-CM | POA: Diagnosis not present

## 2022-04-29 DIAGNOSIS — Z91041 Radiographic dye allergy status: Secondary | ICD-10-CM | POA: Diagnosis not present

## 2022-04-29 DIAGNOSIS — R079 Chest pain, unspecified: Secondary | ICD-10-CM | POA: Diagnosis not present

## 2022-04-29 DIAGNOSIS — Z86711 Personal history of pulmonary embolism: Secondary | ICD-10-CM | POA: Diagnosis not present

## 2022-04-29 DIAGNOSIS — Z888 Allergy status to other drugs, medicaments and biological substances status: Secondary | ICD-10-CM | POA: Diagnosis not present

## 2022-04-29 DIAGNOSIS — R6889 Other general symptoms and signs: Secondary | ICD-10-CM | POA: Diagnosis not present

## 2022-04-29 DIAGNOSIS — R0789 Other chest pain: Secondary | ICD-10-CM | POA: Diagnosis not present

## 2022-04-29 DIAGNOSIS — I132 Hypertensive heart and chronic kidney disease with heart failure and with stage 5 chronic kidney disease, or end stage renal disease: Secondary | ICD-10-CM | POA: Diagnosis not present

## 2022-04-29 DIAGNOSIS — R7881 Bacteremia: Secondary | ICD-10-CM | POA: Diagnosis not present

## 2022-04-29 DIAGNOSIS — E785 Hyperlipidemia, unspecified: Secondary | ICD-10-CM | POA: Diagnosis not present

## 2022-04-29 DIAGNOSIS — G4733 Obstructive sleep apnea (adult) (pediatric): Secondary | ICD-10-CM | POA: Diagnosis not present

## 2022-04-29 DIAGNOSIS — R509 Fever, unspecified: Secondary | ICD-10-CM | POA: Insufficient documentation

## 2022-04-29 DIAGNOSIS — I214 Non-ST elevation (NSTEMI) myocardial infarction: Secondary | ICD-10-CM | POA: Diagnosis not present

## 2022-04-29 DIAGNOSIS — R778 Other specified abnormalities of plasma proteins: Secondary | ICD-10-CM | POA: Diagnosis not present

## 2022-04-29 DIAGNOSIS — N250 Renal osteodystrophy: Secondary | ICD-10-CM | POA: Diagnosis not present

## 2022-04-29 DIAGNOSIS — Z85038 Personal history of other malignant neoplasm of large intestine: Secondary | ICD-10-CM | POA: Diagnosis not present

## 2022-04-29 DIAGNOSIS — N186 End stage renal disease: Secondary | ICD-10-CM | POA: Diagnosis not present

## 2022-04-29 DIAGNOSIS — M25552 Pain in left hip: Secondary | ICD-10-CM | POA: Insufficient documentation

## 2022-04-29 DIAGNOSIS — I2510 Atherosclerotic heart disease of native coronary artery without angina pectoris: Secondary | ICD-10-CM | POA: Diagnosis not present

## 2022-04-29 DIAGNOSIS — I739 Peripheral vascular disease, unspecified: Secondary | ICD-10-CM | POA: Diagnosis not present

## 2022-04-29 DIAGNOSIS — G8929 Other chronic pain: Secondary | ICD-10-CM | POA: Diagnosis not present

## 2022-04-29 DIAGNOSIS — Z5321 Procedure and treatment not carried out due to patient leaving prior to being seen by health care provider: Secondary | ICD-10-CM | POA: Insufficient documentation

## 2022-04-29 DIAGNOSIS — T8241XA Breakdown (mechanical) of vascular dialysis catheter, initial encounter: Secondary | ICD-10-CM | POA: Diagnosis not present

## 2022-04-29 DIAGNOSIS — I120 Hypertensive chronic kidney disease with stage 5 chronic kidney disease or end stage renal disease: Secondary | ICD-10-CM | POA: Diagnosis not present

## 2022-04-29 DIAGNOSIS — I509 Heart failure, unspecified: Secondary | ICD-10-CM | POA: Diagnosis not present

## 2022-04-29 DIAGNOSIS — Z86718 Personal history of other venous thrombosis and embolism: Secondary | ICD-10-CM | POA: Diagnosis not present

## 2022-04-29 DIAGNOSIS — J9811 Atelectasis: Secondary | ICD-10-CM | POA: Diagnosis not present

## 2022-04-29 DIAGNOSIS — Z743 Need for continuous supervision: Secondary | ICD-10-CM | POA: Diagnosis not present

## 2022-04-29 IMAGING — DX DG HIP (WITH OR WITHOUT PELVIS) 2-3V*L*
3 series · 3 of 3 positions shown · non-contrast
Comparison: None.

CLINICAL DATA: Left-sided pain

EXAM:
DG HIP (WITH OR WITHOUT PELVIS) 2-3V LEFT

[t pelvis ap]
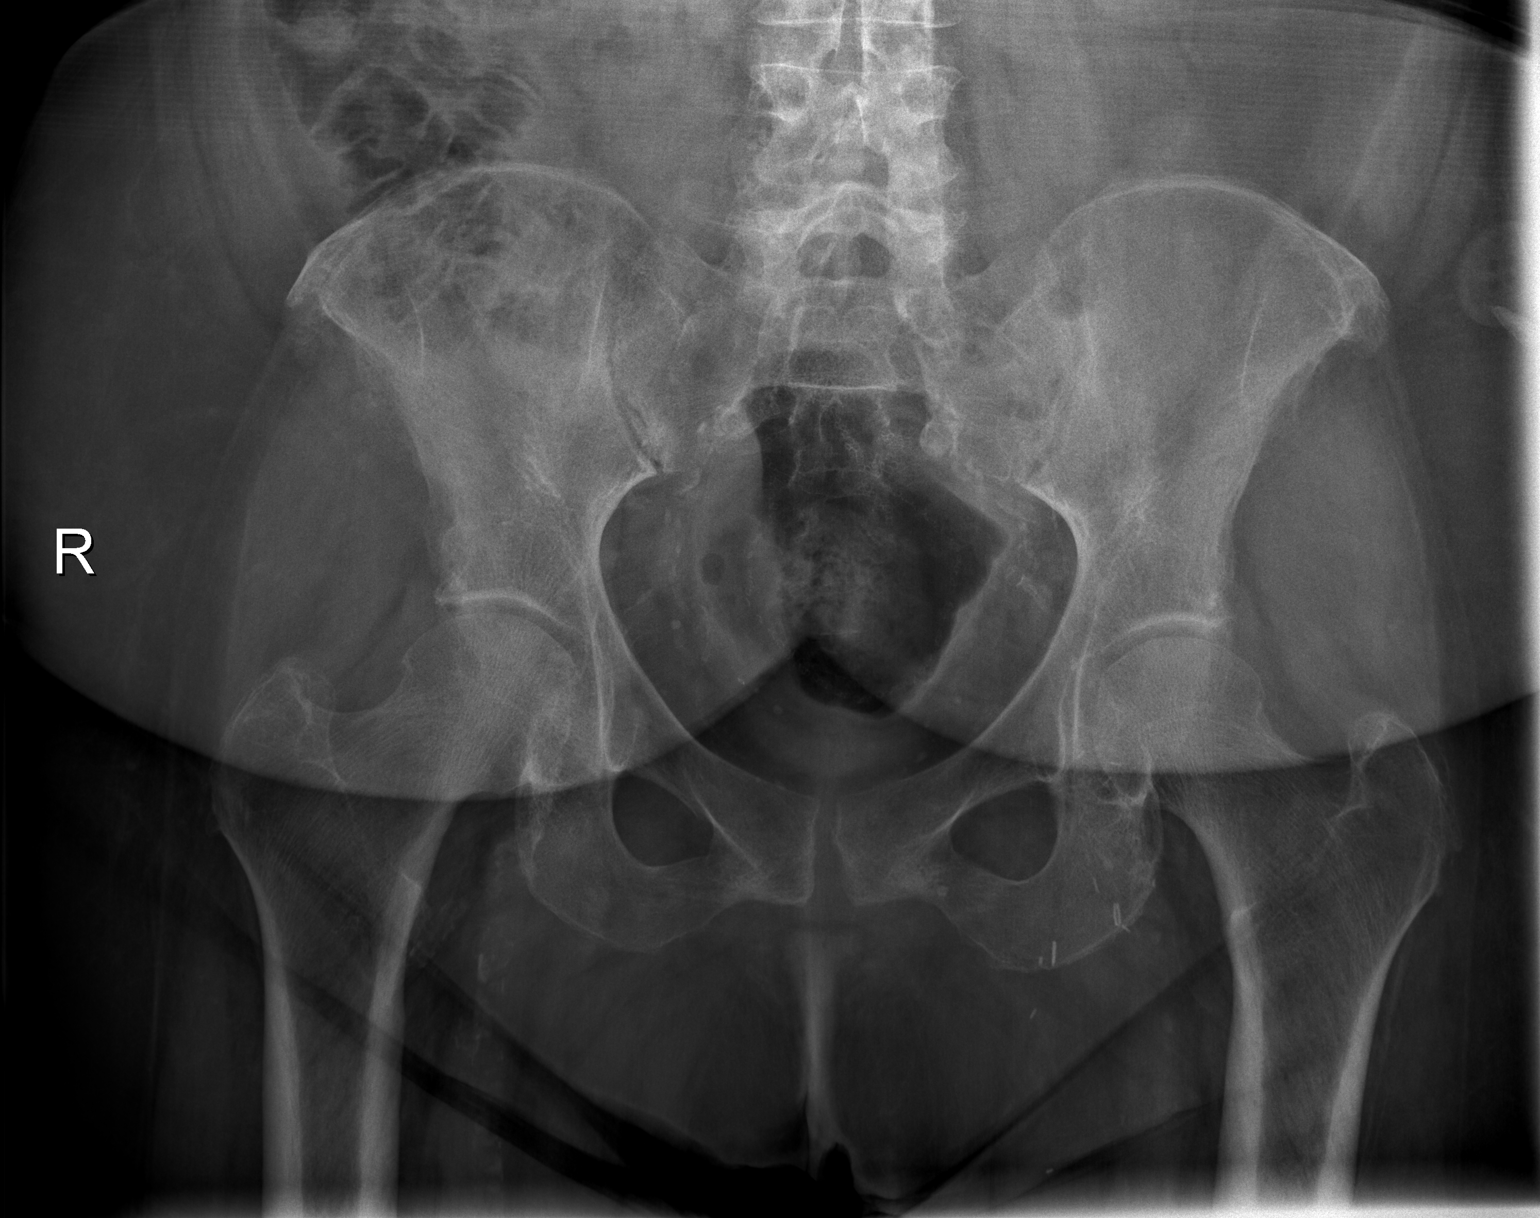

[t hip ap left]
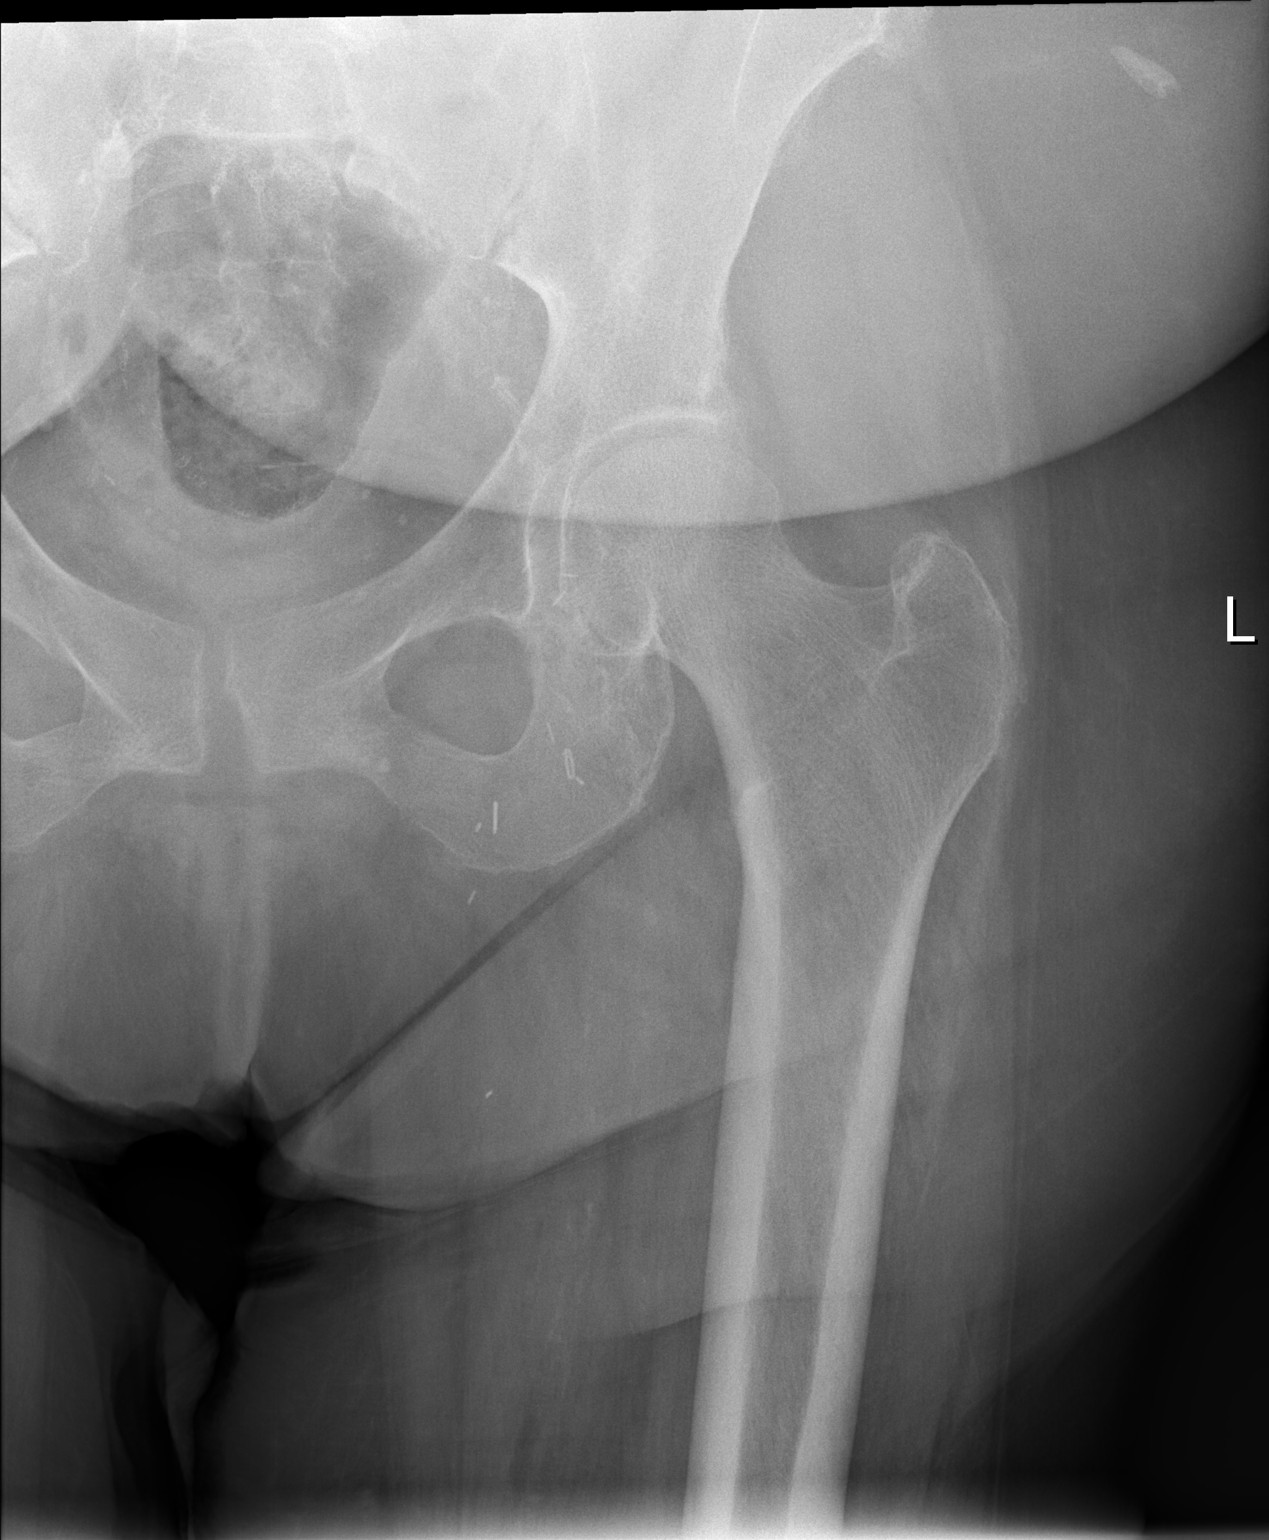

[t hip frog leg left]
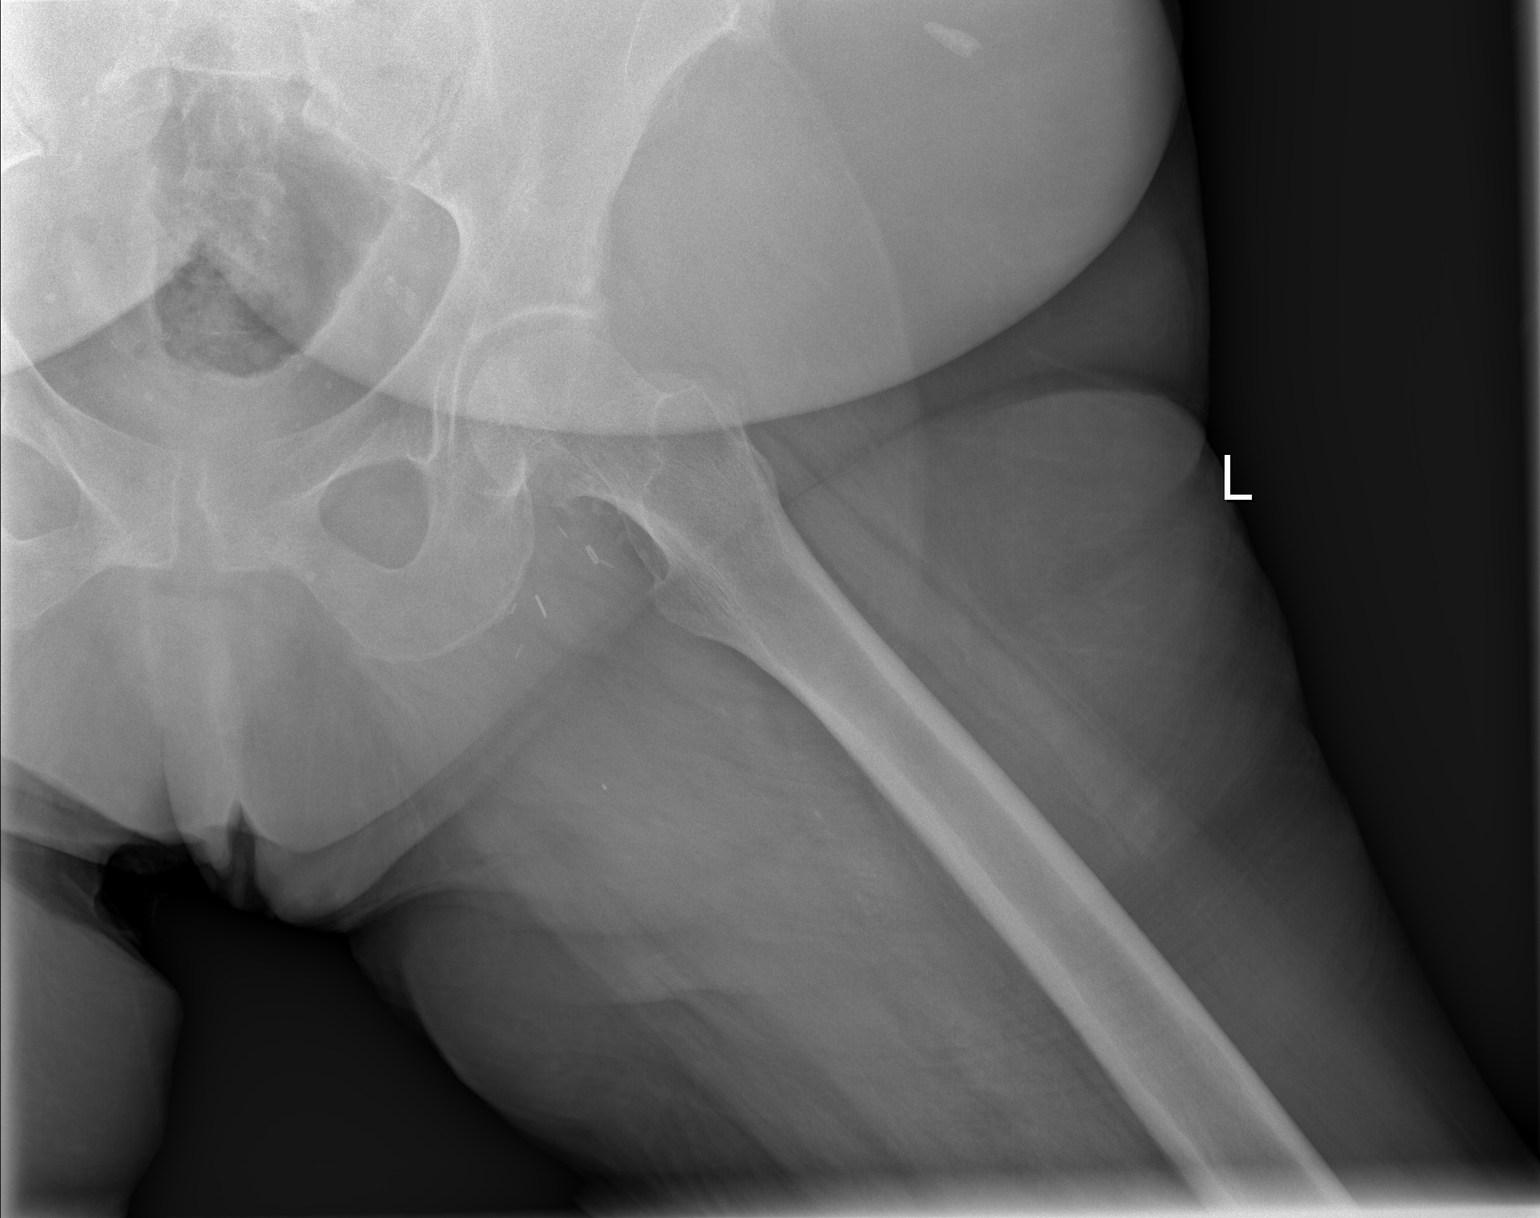

[3 of 3 positions shown; findings below may reference images not displayed]

FINDINGS: SI joints are non widened. Pubic symphysis is intact. There are
vascular calcifications. No fracture or malalignment.
IMPRESSION: No acute osseous abnormality.

## 2022-04-29 MED ORDER — HYDROCODONE-ACETAMINOPHEN 5-325 MG PO TABS
1.0000 | ORAL_TABLET | Freq: Once | ORAL | Status: AC
  Administered 2022-04-29: 1 via ORAL
  Filled 2022-04-29: qty 1

## 2022-04-29 MED ORDER — ONDANSETRON 4 MG PO TBDP
8.0000 mg | ORAL_TABLET | Freq: Once | ORAL | Status: AC
  Administered 2022-04-29: 8 mg via ORAL
  Filled 2022-04-29: qty 2

## 2022-04-29 NOTE — ED Provider Triage Note (Signed)
Emergency Medicine Provider Triage Evaluation Note ? ?Whitney Walker , a 66 y.o. female  was evaluated in triage.  Pt complains of left-sided hip pain for the last 8 or 9 days.  Patient denies any preceding trauma to account for pain.  Patient states she has a history of bilateral hip pain and at one point had septic arthritis however she is unable to elaborate on this.  Patient denies any fevers, chills.  Patient states that she went to dialysis earlier today, had full session.  Patient was noted to have fever at dialysis however she is afebrile here. ? ?Review of Systems  ?Positive:  ?Negative:  ? ?Physical Exam  ?BP (!) 152/76 (BP Location: Right Arm)   Pulse 84   Temp 98.4 ?F (36.9 ?C) (Oral)   Resp 18   SpO2 94%  ?Gen:   Awake, no distress   ?Resp:  Normal effort  ?MSK:   Moves extremities without difficulty  ?Other:   ? ?Medical Decision Making  ?Medically screening exam initiated at 6:00 PM.  Appropriate orders placed.  Whitney Walker was informed that the remainder of the evaluation will be completed by another provider, this initial triage assessment does not replace that evaluation, and the importance of remaining in the ED until their evaluation is complete. ? ? ?  ?Whitney Cecil, PA-C ?04/29/22 1801 ? ?

## 2022-04-29 NOTE — ED Triage Notes (Signed)
Pt c/o inability to walk "again" due to "terrible" pain in L hip. Hx bilateral hip pain. States she took tylenol arthritis & percocet PTA w no improvement in pain. Pt states she has noted fevers at home (101), endorses associated chills ?

## 2022-04-29 NOTE — Chronic Care Management (AMB) (Signed)
? ? ?Chronic Care Management ?Pharmacy Assistant  ? ?Name: Whitney Walker  MRN: [id number] DOB: June 26, 1956 ? ?Reason for Encounter: Medication Review/ Medication coordination ? ?Recent office visits:  ?None ? ?Recent consult visits:  ?04-25-2022 Whitney Craver, MD Whitney Walker). 5 year virtual colon. ? ?Hospital visits:  ?None in previous 6 months ? ?Medications: ?Outpatient Encounter Medications as of 04/29/2022  ?Medication Sig  ? acetaminophen (TYLENOL) 500 MG tablet Take 1,000 mg by mouth 2 (two) times daily as needed for moderate pain or headache.  ? albuterol (VENTOLIN HFA) 108 (90 Base) MCG/ACT inhaler Inhale 2 puffs into the lungs every 6 (six) hours as needed for wheezing or shortness of breath.  ? Ascorbic Acid (VITAMIN C) 100 MG tablet Take 100 mg by mouth daily.  ? atorvastatin (LIPITOR) 10 MG tablet Take 1 tablet (10 mg total) by mouth daily.  ? AURYXIA 1 GM 210 MG(Fe) tablet Take 420-840 mg by mouth with breakfast, with lunch, and with evening meal. Taking  2 tablets daily ('420mg'$ ) with each snack  ? Biotin 10000 MCG TABS Take 10,000 mcg by mouth daily.   ? carvedilol (COREG) 12.5 MG tablet Take 1 tablet (12.5 mg total) by mouth 2 (two) times daily.  ? cyclobenzaprine (FLEXERIL) 10 MG tablet Take 1 tablet (10 mg total) by mouth 3 (three) times daily as needed for muscle spasms.  ? ELIQUIS 2.5 MG TABS tablet TAKE ONE TABLET BY MOUTH TWICE DAILY  ? meclizine (ANTIVERT) 12.5 MG tablet Take 1 tablet (12.5 mg total) by mouth 3 (three) times daily as needed for dizziness.  ? mupirocin ointment (BACTROBAN) 2 % Apply 1 application topically daily as needed (skin bumps).  ? omeprazole (PRILOSEC) 20 MG capsule TAKE ONE CAPSULE BY MOUTH ONCE DAILY  ? oxyCODONE-acetaminophen (PERCOCET) 5-325 MG tablet Take 1 tablet by mouth every 4 (four) hours as needed for severe pain.  ? vitamin E 200 UNIT capsule Take 200 Units by mouth daily.  ? XANAX 0.5 MG tablet Take 1 tablet by mouth as needed.  ? ?No facility-administered  encounter medications on file as of 04/29/2022.  ? ?Reviewed chart for medication changes ahead of medication coordination call. ? ?No OVs, Consults, or hospital visits since last care coordination call/Pharmacist visit. (If appropriate, list visit date, provider name) ? ?No medication changes indicated OR if recent visit, treatment plan here. ? ?BP Readings from Last 3 Encounters:  ?02/19/22 138/68  ?11/27/21 (!) 125/59  ?11/15/21 110/62  ?  ?Lab Results  ?Component Value Date  ? HGBA1C 5.9 (H) 11/15/2021  ?  ? ?Patient obtains medications through Vials  90 Days  ? ?Last adherence delivery included:  ?Carvedilol 12.5 mg twice daily ?Prilosec 20 mg daily ? ?Patient declined (meds) last month: ?Eliquis- Isn't due until 04-23-2022 ?Atorvastatin- Isn't due until 05-21-2022 ? ?Patient is due for next adherence delivery on: 05-09-2022 ? ?Called patient and reviewed medications and coordinated delivery. ? ?This delivery to include: ?Omeprazole 20 mg daily ? ?No short/ acute fills needed ? ?Patient declined the following medications: ?Atorvastatin 10 mg- Patient states she has 3 bottles of medication because she stopped taking medication. Patient wasn't home to check for days supply. ? ? ?Patient needs refills for: Refill request sent ?Omeprazole ? ?Confirmed delivery date of 05-09-2022 advised patient that pharmacy will contact them the morning of delivery. ? ?Care Gaps: ?Shingrix overdue ?COVID booster overdue ?AWV 04-30-2023 ? ?Star Rating Drugs: ?Atorvastatin 10 mg- Last filled 02-19-2022 90 [facility] ? ?Whitney Walker CMA ?Clinical Pharmacist  Assistant ?[phone] ? ?

## 2022-04-30 ENCOUNTER — Telehealth: Payer: Medicare Other | Admitting: Physician Assistant

## 2022-04-30 ENCOUNTER — Encounter: Payer: Self-pay | Admitting: Physician Assistant

## 2022-04-30 NOTE — ED Notes (Signed)
Patient left on own accord °

## 2022-04-30 NOTE — Progress Notes (Signed)
No show

## 2022-05-01 ENCOUNTER — Emergency Department (HOSPITAL_COMMUNITY): Payer: Medicare Other

## 2022-05-01 ENCOUNTER — Encounter (HOSPITAL_COMMUNITY): Payer: Self-pay

## 2022-05-01 ENCOUNTER — Observation Stay (HOSPITAL_COMMUNITY): Payer: Medicare Other

## 2022-05-01 ENCOUNTER — Inpatient Hospital Stay (HOSPITAL_COMMUNITY)
Admission: EM | Admit: 2022-05-01 | Discharge: 2022-05-03 | DRG: 673 | Disposition: A | Discharge status: Home / Self Care | Payer: Medicare Other | Attending: Internal Medicine | Admitting: Internal Medicine

## 2022-05-01 DIAGNOSIS — R7881 Bacteremia: Secondary | ICD-10-CM | POA: Diagnosis not present

## 2022-05-01 DIAGNOSIS — Z6837 Body mass index (BMI) 37.0-37.9, adult: Secondary | ICD-10-CM

## 2022-05-01 DIAGNOSIS — Z86711 Personal history of pulmonary embolism: Secondary | ICD-10-CM | POA: Diagnosis present

## 2022-05-01 DIAGNOSIS — E669 Obesity, unspecified: Secondary | ICD-10-CM | POA: Diagnosis present

## 2022-05-01 DIAGNOSIS — Z8249 Family history of ischemic heart disease and other diseases of the circulatory system: Secondary | ICD-10-CM

## 2022-05-01 DIAGNOSIS — Z888 Allergy status to other drugs, medicaments and biological substances status: Secondary | ICD-10-CM

## 2022-05-01 DIAGNOSIS — N186 End stage renal disease: Secondary | ICD-10-CM | POA: Diagnosis not present

## 2022-05-01 DIAGNOSIS — Z7901 Long term (current) use of anticoagulants: Secondary | ICD-10-CM

## 2022-05-01 DIAGNOSIS — F419 Anxiety disorder, unspecified: Secondary | ICD-10-CM | POA: Diagnosis present

## 2022-05-01 DIAGNOSIS — R079 Chest pain, unspecified: Secondary | ICD-10-CM | POA: Diagnosis not present

## 2022-05-01 DIAGNOSIS — Z86718 Personal history of other venous thrombosis and embolism: Secondary | ICD-10-CM

## 2022-05-01 DIAGNOSIS — D689 Coagulation defect, unspecified: Secondary | ICD-10-CM | POA: Diagnosis not present

## 2022-05-01 DIAGNOSIS — N2581 Secondary hyperparathyroidism of renal origin: Secondary | ICD-10-CM | POA: Diagnosis not present

## 2022-05-01 DIAGNOSIS — I739 Peripheral vascular disease, unspecified: Secondary | ICD-10-CM | POA: Diagnosis present

## 2022-05-01 DIAGNOSIS — G8929 Other chronic pain: Secondary | ICD-10-CM | POA: Diagnosis present

## 2022-05-01 DIAGNOSIS — F17210 Nicotine dependence, cigarettes, uncomplicated: Secondary | ICD-10-CM | POA: Diagnosis present

## 2022-05-01 DIAGNOSIS — D6859 Other primary thrombophilia: Secondary | ICD-10-CM | POA: Diagnosis present

## 2022-05-01 DIAGNOSIS — I2510 Atherosclerotic heart disease of native coronary artery without angina pectoris: Secondary | ICD-10-CM

## 2022-05-01 DIAGNOSIS — Z832 Family history of diseases of the blood and blood-forming organs and certain disorders involving the immune mechanism: Secondary | ICD-10-CM

## 2022-05-01 DIAGNOSIS — Z91041 Radiographic dye allergy status: Secondary | ICD-10-CM

## 2022-05-01 DIAGNOSIS — I214 Non-ST elevation (NSTEMI) myocardial infarction: Secondary | ICD-10-CM | POA: Diagnosis present

## 2022-05-01 DIAGNOSIS — Y712 Prosthetic and other implants, materials and accessory cardiovascular devices associated with adverse incidents: Secondary | ICD-10-CM | POA: Diagnosis present

## 2022-05-01 DIAGNOSIS — E875 Hyperkalemia: Secondary | ICD-10-CM | POA: Diagnosis present

## 2022-05-01 DIAGNOSIS — Z992 Dependence on renal dialysis: Secondary | ICD-10-CM

## 2022-05-01 DIAGNOSIS — M898X9 Other specified disorders of bone, unspecified site: Secondary | ICD-10-CM | POA: Diagnosis present

## 2022-05-01 DIAGNOSIS — D631 Anemia in chronic kidney disease: Secondary | ICD-10-CM | POA: Diagnosis not present

## 2022-05-01 DIAGNOSIS — Z85038 Personal history of other malignant neoplasm of large intestine: Secondary | ICD-10-CM

## 2022-05-01 DIAGNOSIS — R0789 Other chest pain: Secondary | ICD-10-CM

## 2022-05-01 DIAGNOSIS — I455 Other specified heart block: Secondary | ICD-10-CM | POA: Diagnosis present

## 2022-05-01 DIAGNOSIS — J449 Chronic obstructive pulmonary disease, unspecified: Secondary | ICD-10-CM | POA: Diagnosis present

## 2022-05-01 DIAGNOSIS — Z79899 Other long term (current) drug therapy: Secondary | ICD-10-CM

## 2022-05-01 DIAGNOSIS — T8241XA Breakdown (mechanical) of vascular dialysis catheter, initial encounter: Principal | ICD-10-CM | POA: Diagnosis present

## 2022-05-01 DIAGNOSIS — M25552 Pain in left hip: Secondary | ICD-10-CM | POA: Diagnosis present

## 2022-05-01 DIAGNOSIS — R778 Other specified abnormalities of plasma proteins: Secondary | ICD-10-CM | POA: Diagnosis not present

## 2022-05-01 DIAGNOSIS — G4733 Obstructive sleep apnea (adult) (pediatric): Secondary | ICD-10-CM | POA: Diagnosis present

## 2022-05-01 DIAGNOSIS — E785 Hyperlipidemia, unspecified: Secondary | ICD-10-CM | POA: Diagnosis present

## 2022-05-01 DIAGNOSIS — I120 Hypertensive chronic kidney disease with stage 5 chronic kidney disease or end stage renal disease: Secondary | ICD-10-CM | POA: Diagnosis present

## 2022-05-01 DIAGNOSIS — J9811 Atelectasis: Secondary | ICD-10-CM | POA: Diagnosis not present

## 2022-05-01 HISTORY — DX: Dependence on renal dialysis: Z99.2

## 2022-05-01 HISTORY — DX: Dependence on renal dialysis: N18.6

## 2022-05-01 HISTORY — DX: End stage renal disease: N18.6

## 2022-05-01 HISTORY — PX: IR FLUORO GUIDE CV LINE LEFT: IMG2282

## 2022-05-01 LAB — CBG MONITORING, ED: Glucose-Capillary: 164 mg/dL — ABNORMAL HIGH (ref 70–99)

## 2022-05-01 LAB — BASIC METABOLIC PANEL
Anion gap: 15 (ref 5–15)
Anion gap: 16 — ABNORMAL HIGH (ref 5–15)
BUN: 71 mg/dL — ABNORMAL HIGH (ref 8–23)
BUN: 74 mg/dL — ABNORMAL HIGH (ref 8–23)
CO2: 17 mmol/L — ABNORMAL LOW (ref 22–32)
CO2: 13 mmol/L — ABNORMAL LOW (ref 22–32)
Calcium: 8.4 mg/dL — ABNORMAL LOW (ref 8.9–10.3)
Calcium: 7.9 mg/dL — ABNORMAL LOW (ref 8.9–10.3)
Chloride: 101 mmol/L (ref 98–111)
Chloride: 103 mmol/L (ref 98–111)
Creatinine, Ser: 15.23 mg/dL — ABNORMAL HIGH (ref 0.44–1.00)
Creatinine, Ser: 15.55 mg/dL — ABNORMAL HIGH (ref 0.44–1.00)
GFR, Estimated: 2 mL/min — ABNORMAL LOW (ref >60)
GFR, Estimated: 2 mL/min — ABNORMAL LOW (ref >60)
Glucose, Bld: 95 mg/dL (ref 70–99)
Glucose, Bld: 60 mg/dL — ABNORMAL LOW (ref 70–99)
Potassium: 7.2 mmol/L (ref 3.5–5.1)
Potassium: 7.2 mmol/L (ref 3.5–5.1)
Sodium: 133 mmol/L — ABNORMAL LOW (ref 135–145)
Sodium: 132 mmol/L — ABNORMAL LOW (ref 135–145)

## 2022-05-01 LAB — CBC
HCT: 42.1 % (ref 36.0–46.0)
Hemoglobin: 13.1 g/dL (ref 12.0–15.0)
MCH: 29.8 pg (ref 26.0–34.0)
MCHC: 31.1 g/dL (ref 30.0–36.0)
MCV: 95.9 fL (ref 80.0–100.0)
Platelets: 167 10*3/uL (ref 150–400)
RBC: 4.39 MIL/uL (ref 3.87–5.11)
RDW: 15 % (ref 11.5–15.5)
WBC: 12.7 10*3/uL — ABNORMAL HIGH (ref 4.0–10.5)
nRBC: 0 % (ref 0.0–0.2)

## 2022-05-01 LAB — I-STAT CHEM 8, ED
BUN: 27 mg/dL — ABNORMAL HIGH (ref 8–23)
Calcium, Ion: 0.89 mmol/L — CL (ref 1.15–1.40)
Chloride: 101 mmol/L (ref 98–111)
Creatinine, Ser: 7.9 mg/dL — ABNORMAL HIGH (ref 0.44–1.00)
Glucose, Bld: 60 mg/dL — ABNORMAL LOW (ref 70–99)
HCT: 37 % (ref 36.0–46.0)
Hemoglobin: 12.6 g/dL (ref 12.0–15.0)
Potassium: 3.8 mmol/L (ref 3.5–5.1)
Sodium: 136 mmol/L (ref 135–145)
TCO2: 22 mmol/L (ref 22–32)

## 2022-05-01 LAB — TROPONIN I (HIGH SENSITIVITY)
Troponin I (High Sensitivity): 19 ng/L — ABNORMAL HIGH (ref <18)
Troponin I (High Sensitivity): 16 ng/L (ref <18)
Troponin I (High Sensitivity): 60 ng/L — ABNORMAL HIGH (ref <18)

## 2022-05-01 LAB — HEPATITIS B SURFACE ANTIGEN: Hepatitis B Surface Ag: NONREACTIVE

## 2022-05-01 LAB — HEPATITIS B SURFACE ANTIBODY,QUALITATIVE: Hep B S Ab: NONREACTIVE

## 2022-05-01 IMAGING — XA IR EXTERNAL REPAIR CV CATH
3 series · 4 of 4 positions shown · non-contrast
Comparison: none

INDICATION: 65-year-old female with end-stage renal disease on hemodialysis has
a malfunctioning left IJ tunneled dialysis catheter

[Series 1: fl (-) angio · 1 of 1 slices shown (1 of 3)]
[im 1/1]
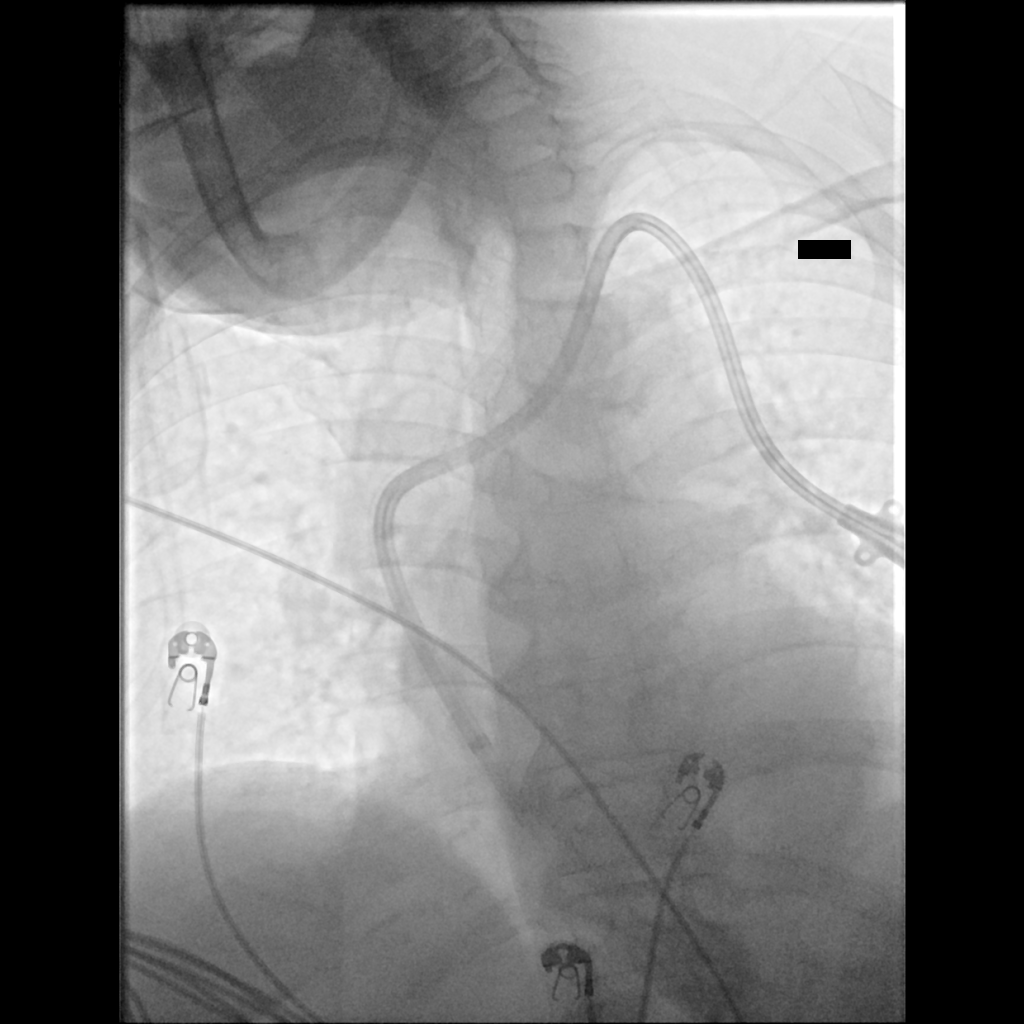

[Series 2: fl (-) angio · 1 of 1 slices shown (2 of 3)]
[im 1/1]
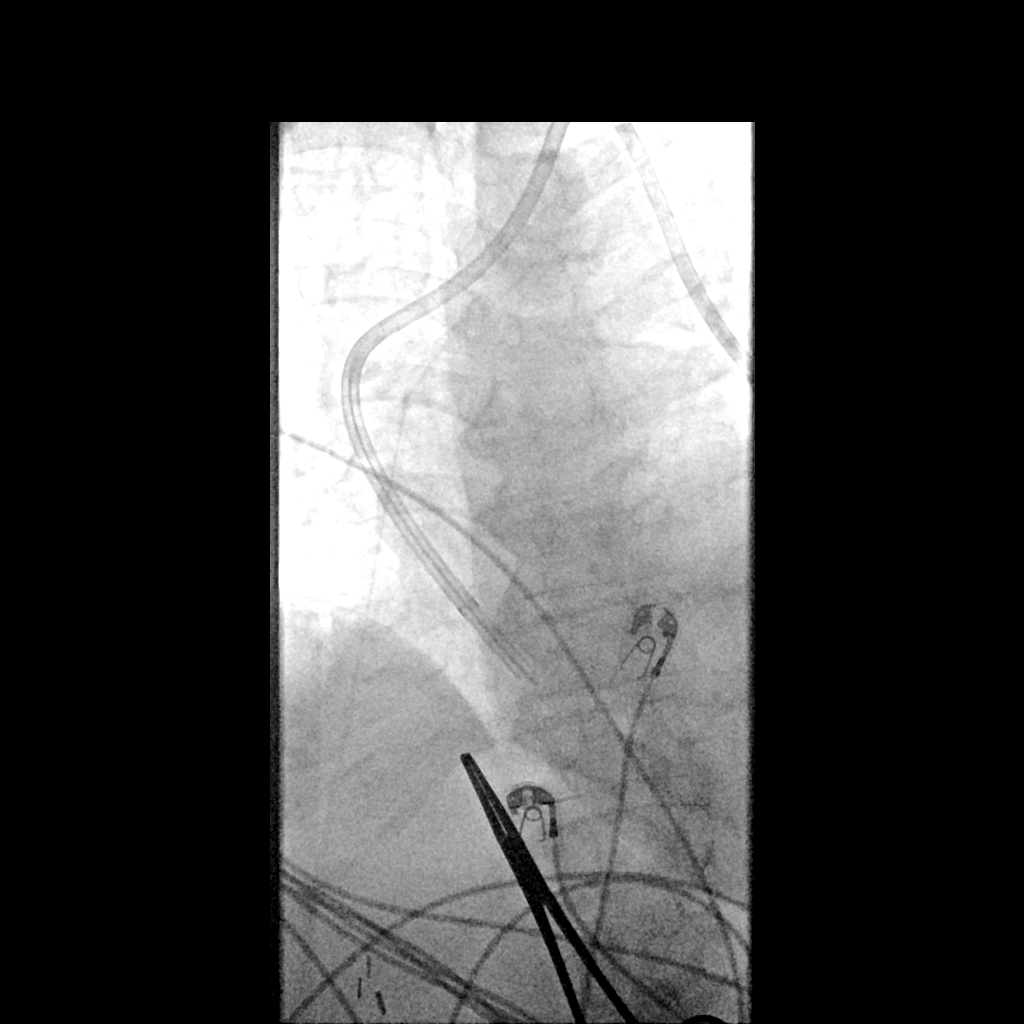

[Series 3: fl (-) angio · 2 of 2 slices shown (3 of 3)]
[im 1/2]
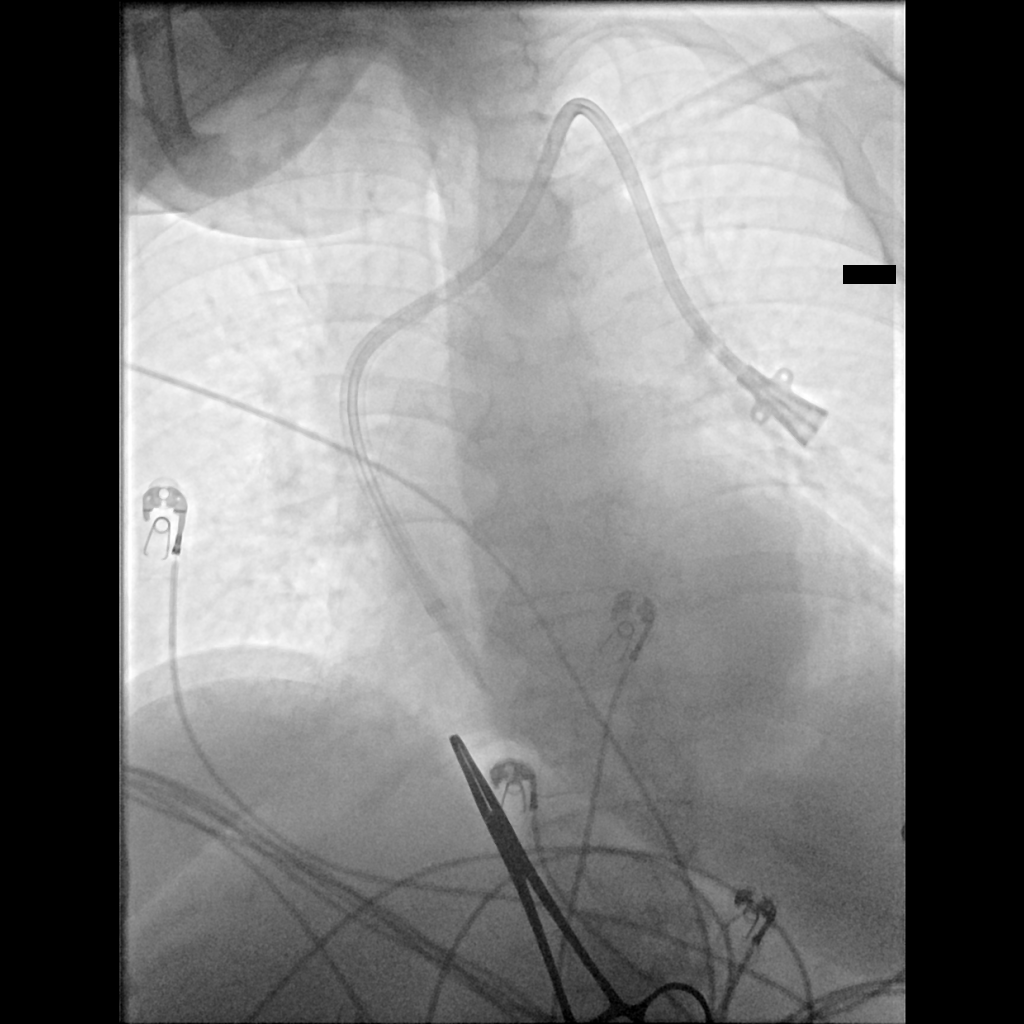
[im 2/2]
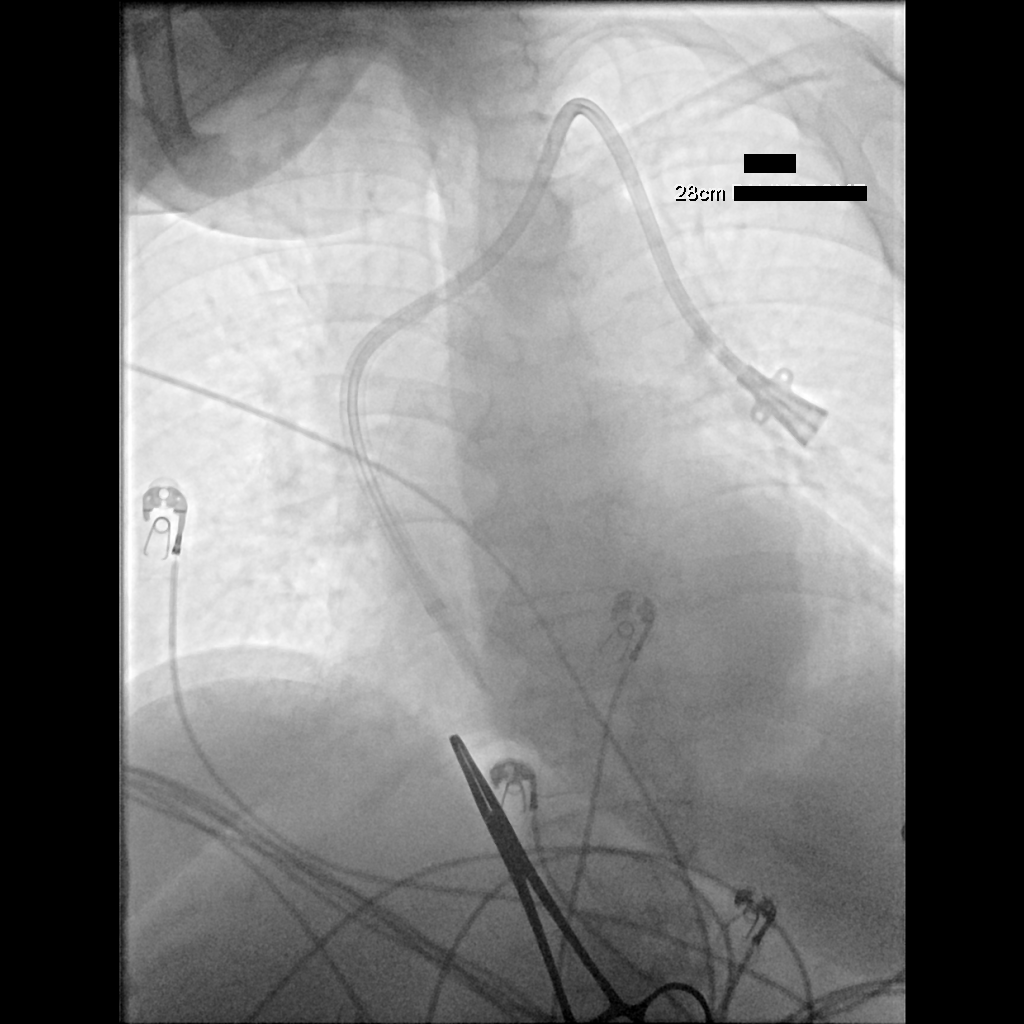

[4 of 4 positions shown; findings below may reference images not displayed]

EXAM:
DANSER - EXTERNAL REPAIR CV CATH

MEDICATIONS:
None; The antibiotic was administered within an appropriate time
interval prior to skin puncture.

ANESTHESIA/SEDATION:
Procedure was performed under local anesthesia

FLUOROSCOPY:
Radiation Exposure Index (as provided by the fluoroscopic device):
10.1 mGy Kerma

COMPLICATIONS:
None immediate.

PROCEDURE:
Informed written consent was obtained from the patient after a
thorough discussion of the procedural risks, benefits and
alternatives. All questions were addressed. Maximal Sterile Barrier
Technique was utilized including caps, mask, sterile gowns, sterile
gloves, sterile drape, hand hygiene and skin antiseptic. A timeout
was performed prior to the initiation of the procedure.

Existing left IJ tunneled catheter site was prepped and draped in
sterile fashion. 1% xylocaine was administered for local anesthesia.
Then, with blunt and sharp dissection, the cuff of the catheter was
released from the surrounding tissue, 035 inch stiff Glidewire was
inserted, the catheter was removed and a new 14.5 French by 23 cm
palindrome catheter was advanced over the Glidewire, to place the
tip of the catheter at the caval atrial junction. Both the ports of
the catheter aspirated and flushed briskly. The ports were flushed
with saline and heparin, the catheter was anchored to the skin using
0 Prolene and a sterile dressing including a Biopatch was placed.
Fluoroscopic spot images obtained.
IMPRESSION: Fluoroscopic guided left transjugular tunneled dialysis catheter
exchange.

## 2022-05-01 MED ORDER — DEXTROSE 50 % IV SOLN
1.0000 | Freq: Once | INTRAVENOUS | Status: AC
  Administered 2022-05-01: 50 mL via INTRAVENOUS
  Filled 2022-05-01: qty 50

## 2022-05-01 MED ORDER — SODIUM BICARBONATE 8.4 % IV SOLN
50.0000 meq | Freq: Once | INTRAVENOUS | Status: AC
  Administered 2022-05-01: 50 meq via INTRAVENOUS
  Filled 2022-05-01: qty 50

## 2022-05-01 MED ORDER — INSULIN ASPART 100 UNIT/ML IV SOLN: 5.0000 [IU] | Freq: Once | INTRAVENOUS | Status: DC

## 2022-05-01 MED ORDER — FENTANYL CITRATE PF 50 MCG/ML IJ SOSY
50.0000 ug | PREFILLED_SYRINGE | Freq: Once | INTRAMUSCULAR | Status: AC
  Administered 2022-05-01: 50 ug via INTRAVENOUS
  Filled 2022-05-01: qty 1

## 2022-05-01 MED ORDER — CHLORHEXIDINE GLUCONATE 4 % EX LIQD
CUTANEOUS | Status: AC
  Filled 2022-05-01: qty 15

## 2022-05-01 MED ORDER — FENTANYL CITRATE PF 50 MCG/ML IJ SOSY
25.0000 ug | PREFILLED_SYRINGE | Freq: Once | INTRAMUSCULAR | Status: AC
  Administered 2022-05-01: 25 ug via INTRAVENOUS
  Filled 2022-05-01: qty 1

## 2022-05-01 MED ORDER — INSULIN ASPART 100 UNIT/ML IV SOLN
5.0000 [IU] | Freq: Once | INTRAVENOUS | Status: AC
Start: 2022-05-01 — End: 2022-05-01
  Administered 2022-05-01: 5 [IU] via INTRAVENOUS

## 2022-05-01 MED ORDER — HEPARIN SODIUM (PORCINE) 1000 UNIT/ML DIALYSIS
2000.0000 [IU] | INTRAMUSCULAR | Status: DC | PRN
  Filled 2022-05-01: qty 2

## 2022-05-01 MED ORDER — HYDROMORPHONE HCL 1 MG/ML IJ SOLN
INTRAMUSCULAR | Status: AC
  Filled 2022-05-01: qty 0.5

## 2022-05-01 MED ORDER — ASPIRIN 325 MG PO TABS
325.0000 mg | ORAL_TABLET | Freq: Once | ORAL | Status: DC
  Filled 2022-05-01: qty 1

## 2022-05-01 MED ORDER — SODIUM ZIRCONIUM CYCLOSILICATE 10 G PO PACK
10.0000 g | PACK | Freq: Once | ORAL | Status: AC
  Administered 2022-05-01: 10 g via ORAL
  Filled 2022-05-01: qty 1

## 2022-05-01 MED ORDER — ONDANSETRON HCL 4 MG/2ML IJ SOLN
4.0000 mg | Freq: Once | INTRAMUSCULAR | Status: AC
  Administered 2022-05-01: 4 mg via INTRAVENOUS
  Filled 2022-05-01: qty 2

## 2022-05-01 MED ORDER — SODIUM BICARBONATE 8.4 % IV SOLN: 50.0000 meq | Freq: Once | INTRAVENOUS | Status: DC

## 2022-05-01 MED ORDER — DOXERCALCIFEROL 4 MCG/2ML IV SOLN
5.0000 ug | INTRAVENOUS | Status: DC
  Administered 2022-05-03: 5 ug via INTRAVENOUS
  Filled 2022-05-01 (×2): qty 4

## 2022-05-01 MED ORDER — CALCIUM GLUCONATE 10 % IV SOLN
1.0000 g | Freq: Once | INTRAVENOUS | Status: AC
  Administered 2022-05-01: 1 g via INTRAVENOUS
  Filled 2022-05-01: qty 10

## 2022-05-01 MED ORDER — CALCIUM GLUCONATE-NACL 1-0.675 GM/50ML-% IV SOLN
1.0000 g | Freq: Once | INTRAVENOUS | Status: AC
  Administered 2022-05-02: 1000 mg via INTRAVENOUS
  Filled 2022-05-01: qty 50

## 2022-05-01 MED ORDER — SODIUM CHLORIDE 0.9 % IV BOLUS
500.0000 mL | Freq: Once | INTRAVENOUS | Status: AC
Start: 2022-05-01 — End: 2022-05-02
  Administered 2022-05-02: 500 mL via INTRAVENOUS

## 2022-05-01 MED ORDER — HYDROMORPHONE HCL 1 MG/ML IJ SOLN: 1.0000 mg | Freq: Once | INTRAMUSCULAR | Status: DC

## 2022-05-01 MED ORDER — ALBUTEROL SULFATE (2.5 MG/3ML) 0.083% IN NEBU
10.0000 mg | INHALATION_SOLUTION | Freq: Once | RESPIRATORY_TRACT | Status: AC
  Administered 2022-05-01: 10 mg via RESPIRATORY_TRACT
  Filled 2022-05-01: qty 12

## 2022-05-01 MED ORDER — HEPARIN SODIUM (PORCINE) 1000 UNIT/ML IJ SOLN
INTRAMUSCULAR | Status: AC
  Administered 2022-05-01: 1000 [IU]
  Filled 2022-05-01: qty 5

## 2022-05-01 MED ORDER — LIDOCAINE HCL 1 % IJ SOLN
INTRAMUSCULAR | Status: AC
  Administered 2022-05-01: 10 mL
  Filled 2022-05-01: qty 20

## 2022-05-01 MED ORDER — HEPARIN SODIUM (PORCINE) 1000 UNIT/ML IJ SOLN
INTRAMUSCULAR | Status: AC
  Filled 2022-05-01: qty 10

## 2022-05-01 MED ORDER — CHLORHEXIDINE GLUCONATE CLOTH 2 % EX PADS
6.0000 | MEDICATED_PAD | Freq: Every day | CUTANEOUS | Status: DC
  Administered 2022-05-02 – 2022-05-03 (×2): 6 via TOPICAL

## 2022-05-01 MED ORDER — HYDROMORPHONE HCL 1 MG/ML IJ SOLN
0.5000 mg | INTRAMUSCULAR | Status: DC | PRN
  Administered 2022-05-01: 0.5 mg via INTRAVENOUS

## 2022-05-01 MED ORDER — DEXTROSE 50 % IV SOLN: 1.0000 | Freq: Once | INTRAVENOUS | Status: DC

## 2022-05-01 NOTE — ED Notes (Signed)
Pt transported to IR 

## 2022-05-01 NOTE — ED Provider Notes (Signed)
?  Physical Exam  ?BP (!) 93/50   Pulse 92   Temp (!) 97.5 ?F (36.4 ?C) (Temporal)   Resp (!) 22   Ht '5\' 2"'$  (1.575 m)   Wt 93.3 kg   SpO2 95%   BMI 37.62 kg/m?  ? ?Physical Exam ? ?Procedures  ?Procedures ? ?ED Course / MDM  ?  ?Medical Decision Making ?Patient came down from dialysis around 10 PM.  Patient had emergent dialysis due to hyperkalemia.  Patient had chest pain before dialysis but continues to have chest pain.  Repeat EKG unremarkable.  Plan to get repeat troponin and repeat chemistry.  Pain appears to be reproducible in nature ? ?11:11 PM ?Repeat troponin is now 60 and it was 16 previously.  Since patient's troponin is trending positive, patient will need to be admitted to the hospital.  Given aspirin.  [name] potassium is down to 3.8.  Her ionized calcium was low at 0.89 and I ordered IV calcium. Hospitalist to admit at this point ? ?Problems Addressed: ?Chest wall pain: acute illness or injury ?Hyperkalemia: acute illness or injury ? ?Amount and/or Complexity of Data Reviewed ?Labs: ordered. Decision-making details documented in ED Course. ?Radiology: ordered and independent interpretation performed. Decision-making details documented in ED Course. ?ECG/medicine tests: ordered and independent interpretation performed. Decision-making details documented in ED Course. ? ?Risk ?OTC drugs. ?Prescription drug management. ?Decision regarding hospitalization. ? ? ? ? ? ? ? ?  ?Drenda Freeze, MD ?05/01/22 2335 ? ?

## 2022-05-01 NOTE — Procedures (Signed)
? ?  I was present at this dialysis session, have reviewed the session itself and made  appropriate changes ?Kelly Splinter MD ?[employer] ?pager [phone]   ?05/01/2022, 4:30 PM ? ? ?

## 2022-05-01 NOTE — Consult Note (Addendum)
Renal Service ?Consult Note ?[facility] ? ?Whitney Walker ?05/01/2022 ?[name], MD ?Requesting Physician: Dr. [name] ? ?Reason for Consult: ESRD pt w/ CP and SOB and faulty HD catheter ?HPI: The patient is a 66 y.o. year-old w/ hx of CHF, colon cancer, COPD, CAD, depression, hx DVT/ PE, ESRD on HD, HTN, PVD who presented to ED today w/ c/o poor HD catheter functioning at HD earlier this am. Was not able to get good HD. In ED pt had CP and SOB which is now better. BP's were high initially at 126/106, now down to 129/66. Labs showed K+ 7.2.  Pt has rec'd IV gluc/ insulin and lokelma. We are asked to see for esrd.   ? ?Per HD staff pt's HD cath was positional at HD this am, they had to stop HD in the 1st 5 minutes to flush both ports and got it to run w/ pt holding her neck / head a certain way. After another 5 min the catheter malfunctioned again and pt started to c/o chest pain so was taken off and sent to ED.   ? ?Pt seen in ED.  Main c/o is L hip pain going on for several days to weeks.  CP and SOB have resolved. She is on [place] O2. She had fistulas in her LUA which are no longer working. States doesn't want anything other than HD catheter for her HD.  ? ? ?ROS - no joint pain, no HA, no blurry vision, no rash, no diarrhea, no nausea/ vomiting, no dysuria, no difficulty voiding ? ? ?Past Medical History  ?Past Medical History:  ?Diagnosis Date  ? Anemia   ? Anxiety   ? CHF (congestive heart failure) ([name])   ? Colon cancer ([name])   ? treatment surgery  ? Complication of anesthesia   ? after first C- [place] "couldnt walk after", patient denies having a spinal  ? COPD (chronic obstructive pulmonary disease) ([name])   ? Coronary artery disease   ? Depression   ? DVT (deep venous thrombosis) ([name])   ? ESRD (end stage renal disease) ([name])   ? Hemo: MWF  ? History of blood transfusion 04/2018  ? Hypertension   ? 07/07/18- no longer takes BP medications  ? Meningitis   ? Pain in limb 07/30/2013  ? PE  (pulmonary embolism)   ? Peripheral vascular disease ([name])   ? Restless legs   ? Shortness of breath   ? with exertion  ? Sleep apnea   ? SOB (shortness of breath) 03/03/2019  ? Vertigo   ? ?Past Surgical History  ?Past Surgical History:  ?Procedure Laterality Date  ? ABDOMINAL AORTAGRAM N/A 07/26/2013  ? Procedure: ABDOMINAL AORTAGRAM;  Surgeon: [name] Boaz, MD;  Location: [facility] CATH LAB;  Service: Cardiovascular;  Laterality: N/A;  ? ABDOMINAL HYSTERECTOMY    ? AV FISTULA PLACEMENT Left 05/05/2018  ? Procedure: ARTERIOVENOUS (AV) FISTULA CREATION BRACHIOCEPHALIC;  Surgeon: Waynetta Sandy, MD;  Location: [place];  Service: Vascular;  Laterality: Left;  ? AV FISTULA PLACEMENT Left 07/16/2018  ? Procedure: ARTERIOVENOUS FISTULA CREATION;  Surgeon: Waynetta Sandy, MD;  Location: [place];  Service: Vascular;  Laterality: Left;  ? BASCILIC VEIN TRANSPOSITION Left 09/03/2018  ? Procedure: BASILIC VEIN TRANSPOSITION SECOND STAGE;  Surgeon: Waynetta Sandy, MD;  Location: [place];  Service: Vascular;  Laterality: Left;  ? BREAST BIOPSY Left   ? CESAREAN SECTION    ? X 3 [id number]  ? CHOLECYSTECTOMY    ?  CHOLECYSTECTOMY  1980/s  ? COLECTOMY  2010  ? DIVERTICULOSIS SURGERY-2002  2012  ? FEMORAL-POPLITEAL BYPASS GRAFT Left 08/13/2013  ? Procedure: BYPASS GRAFT FEMORAL-POPLITEAL ARTERY WITH NON-REVERSED SAPHANEOUS VEIN; ULTRASOUND GUIDED;  Surgeon: Mal [name], MD;  Location: [place];  Service: Vascular;  Laterality: Left;  ? INSERTION OF DIALYSIS CATHETER Right 04/30/2018  ? Procedure: INSERTION OF Right Internal Jugular DIALYSIS CATHETER;  Surgeon: [name], MD;  Location: [facility] OR;  Service: Vascular;  Laterality: Right;  ? IR FLUORO GUIDE CV LINE RIGHT  07/24/2020  ? IR FLUORO GUIDE CV LINE RIGHT  07/21/2021  ? IR REMOVAL TUN CV CATH W/O FL  07/21/2020  ? IR REMOVAL TUN CV CATH W/O FL  07/19/2021  ? IR US GUIDE VASC ACCESS RIGHT  07/24/2020  ? IR US GUIDE VASC ACCESS RIGHT  07/21/2021  ? LEFT HEART CATH AND  CORONARY ANGIOGRAPHY N/A 05/01/2021  ? Procedure: LEFT HEART CATH AND CORONARY ANGIOGRAPHY;  Surgeon: [name], MD;  Location: [name] CV LAB;  Service: Cardiovascular;  Laterality: N/A;  ? LOWER EXTREMITY ANGIOGRAM Left 07/26/2013  ? Procedure: LOWER EXTREMITY ANGIOGRAM;  Surgeon: [name], MD;  Location: [facility] CATH LAB;  Service: Cardiovascular;  Laterality: Left;  ? ?Family History  ?Family History  ?Problem Relation Age of Onset  ? Cancer Mother 83  ?     breast and bone  ? Cancer Father 16  ?     prostate  ? Hypertension Sister   ? Bleeding Disorder Sister   ? Cancer Cousin 20  ?     breast cancer   ? Hypertension Daughter   ? Breast cancer Neg Hx   ? ?Social History  reports that she has been smoking cigarettes. She has a 25.00 pack-year smoking history. She has quit using smokeless tobacco. She reports that she does not currently use drugs after having used the following drugs: Oxycodone. She reports that she does not drink alcohol. ?Allergies  ?Allergies  ?Allergen Reactions  ? Carnosine   ?  Other reaction(s): Unknown  ? Gadolinium Derivatives Hives and Other (See Comments)  ?  HIVES, Desc: HIVES W/ "DYE" USED FOR 1ST CT SCAN BUT NOT 2ND, NO PREMEDS USED, PT UNCERTAIN OF CIRCUMSTANCES,,?POSSIBLE MRI CONTRAST ALLERGY, ALL STUDIES DONE "SOMEWHERE" IN [place]//A.C., Onset Date: [id number]  ? Iohexol Other (See Comments)  ?   Code: HIVES, Desc: HIVES W/ "DYE" USED FOR 1ST CT SCAN BUT NOT 2ND, NO PREMEDS USED, PT UNCERTAIN OF CIRCUMSTANCES,,?POSSIBLE MRI CONTRAST ALLERGY, ALL STUDIES DONE "SOMEWHERE" IN [place]//A.C., Onset Date: [id number] ?  ? Iodinated Contrast Media Hives  ? Iodine Hives  ? Naltrexone   ?  Other reaction(s): Unknown  ? ?Home medications ?Prior to Admission medications   ?Medication Sig Start Date End Date Taking? Authorizing Provider  ?acetaminophen (TYLENOL) 500 MG tablet Take 1,000 mg by mouth 2 (two) times daily as needed for moderate pain or headache.    [provider]  ?albuterol (VENTOLIN HFA) 108 (90 Base) MCG/ACT inhaler Inhale 2 puffs into the lungs every 6 (six) hours as needed for wheezing or shortness of breath. 08/03/20   [name], FNP  ?Ascorbic Acid (VITAMIN C) 100 MG tablet Take 100 mg by mouth daily.    [provider]  ?atorvastatin (LIPITOR) 10 MG tablet Take 1 tablet (10 mg total) by mouth daily. 02/19/22   Cantwell, Celeste C, PA-C  ?AURYXIA 1 GM 210 MG(Fe) tablet Take 420-840 mg by mouth with breakfast, with  lunch, and with evening meal. Taking  2 tablets daily ('420mg'$ ) with each snack 05/15/21   [provider]  ?Biotin 10000 MCG TABS Take 10,000 mcg by mouth daily.     [provider]  ?carvedilol (COREG) 12.5 MG tablet Take 1 tablet (12.5 mg total) by mouth 2 (two) times daily. 11/27/21   Cantwell, Celeste C, PA-C  ?cyclobenzaprine (FLEXERIL) 10 MG tablet Take 1 tablet (10 mg total) by mouth 3 (three) times daily as needed for muscle spasms. 11/15/21   [name], FNP  ?ELIQUIS 2.5 MG TABS tablet TAKE ONE TABLET BY MOUTH TWICE DAILY 04/15/22   [name], FNP  ?meclizine (ANTIVERT) 12.5 MG tablet Take 1 tablet (12.5 mg total) by mouth 3 (three) times daily as needed for dizziness. 04/12/21   Glendale Chard, MD  ?mupirocin ointment (BACTROBAN) 2 % Apply 1 application topically daily as needed (skin bumps). 04/15/19   [name], [place]  ?omeprazole (PRILOSEC) 20 MG capsule TAKE ONE CAPSULE BY MOUTH ONCE DAILY 04/26/22   Truitt Merle, MD  ?oxyCODONE-acetaminophen (PERCOCET) 5-325 MG tablet Take 1 tablet by mouth every 4 (four) hours as needed for severe pain. 08/09/21   [name], FNP  ?vitamin E 200 UNIT capsule Take 200 Units by mouth daily.    [provider]  ?Duanne Moron 0.5 MG tablet Take 1 tablet by mouth as needed. 10/11/21   [provider]  ? ? ? ?Vitals:  ? 05/01/22 1124 05/01/22 1130 05/01/22 1145 05/01/22 1219  ?BP: (!) 147/124 (!) 122/48 (!) 144/55 (!) 140/59  ?Pulse: 75 72 72 82  ?Resp: '16 15 12 20   '$ ?Temp:      ?TempSrc:      ?SpO2: 97% 98% 97% 95%  ?Weight:      ?Height:      ? ?Exam ?Gen alert, no distress ?No rash, cyanosis or gangrene ?Sclera anicteric, throat clear  ?No jvd or bruits ?Chest clear b

## 2022-05-01 NOTE — ED Triage Notes (Signed)
Pt arrives via [facility] for CP. Pt was at dialysis earlier this morning and had access issues with her port. Pt then noticed midsternal, non-radiating CP and SOB. ? ?

## 2022-05-01 NOTE — ED Provider Notes (Signed)
?[facility] ?Provider Note ? ? ?CSN: [id number] ?Arrival date & time: 05/01/22  0746 ? ?  ? ?History ? ?Chief Complaint  ?Patient presents with  ? Chest Pain  ? ? ?Whitney Walker is a 66 y.o. female.  PMH significant forAnemia, CHF, COPD, CAD, DVT, ESRD MWF, HTN, history of PE. ? ?Patient presents today for chest pressure and SOB.  She reports that she went to her dialysis session this morning and they were trying to gain access and were unsuccessful when she started noticing sudden onset of chest pressure and shortness of breath.  Patient reports that her last full dialysis session was Friday 4/28 and that Monday 5/1 she did go to dialysis but did not receive a full session because there was issues with the dialysis machine.  Reports that last lab work she had showed good levels and her potassium was 5.  Denies any changes in her diet that would result in her potassium being elevated today.  Denies any changes in vision or headaches.  Reports nausea and she did have vomiting earlier this week and came to the emergency department for the nausea but was not seen due to the wait. ? ? ? ?Home Medications ?Prior to Admission medications   ?Medication Sig Start Date End Date Taking? Authorizing Provider  ?acetaminophen (TYLENOL) 500 MG tablet Take 1,000 mg by mouth 2 (two) times daily as needed for moderate pain or headache.    [provider]  ?albuterol (VENTOLIN HFA) 108 (90 Base) MCG/ACT inhaler Inhale 2 puffs into the lungs every 6 (six) hours as needed for wheezing or shortness of breath. 08/03/20   [name], FNP  ?Ascorbic Acid (VITAMIN C) 100 MG tablet Take 100 mg by mouth daily.    [provider]  ?atorvastatin (LIPITOR) 10 MG tablet Take 1 tablet (10 mg total) by mouth daily. 02/19/22   Cantwell, Celeste C, PA-C  ?AURYXIA 1 GM 210 MG(Fe) tablet Take 420-840 mg by mouth with breakfast, with lunch, and with evening meal. Taking  2 tablets daily  ('420mg'$ ) with each snack 05/15/21   [provider]  ?Biotin 10000 MCG TABS Take 10,000 mcg by mouth daily.     [provider]  ?carvedilol (COREG) 12.5 MG tablet Take 1 tablet (12.5 mg total) by mouth 2 (two) times daily. 11/27/21   Cantwell, Celeste C, PA-C  ?cyclobenzaprine (FLEXERIL) 10 MG tablet Take 1 tablet (10 mg total) by mouth 3 (three) times daily as needed for muscle spasms. 11/15/21   [name], FNP  ?ELIQUIS 2.5 MG TABS tablet TAKE ONE TABLET BY MOUTH TWICE DAILY 04/15/22   [name], FNP  ?meclizine (ANTIVERT) 12.5 MG tablet Take 1 tablet (12.5 mg total) by mouth 3 (three) times daily as needed for dizziness. 04/12/21   Glendale Chard, MD  ?mupirocin ointment (BACTROBAN) 2 % Apply 1 application topically daily as needed (skin bumps). 04/15/19   [name], [place]  ?omeprazole (PRILOSEC) 20 MG capsule TAKE ONE CAPSULE BY MOUTH ONCE DAILY 04/26/22   Truitt Merle, MD  ?oxyCODONE-acetaminophen (PERCOCET) 5-325 MG tablet Take 1 tablet by mouth every 4 (four) hours as needed for severe pain. 08/09/21   [name], FNP  ?vitamin E 200 UNIT capsule Take 200 Units by mouth daily.    [provider]  ?Duanne Moron 0.5 MG tablet Take 1 tablet by mouth as needed. 10/11/21   [provider]  ?   ? ?Allergies    ?Carnosine, Gadolinium derivatives,  Iohexol, Iodinated contrast media, Iodine, and Naltrexone   ? ?Review of Systems   ?Review of Systems  ?Constitutional:  Negative for chills and fever.  ?HENT:  Negative for congestion.   ?Respiratory:  Positive for shortness of breath.   ?Cardiovascular:  Positive for chest pain.  ?Gastrointestinal:  Positive for nausea and vomiting. Negative for abdominal pain.  ?Genitourinary:  Negative for pelvic pain.  ?     Patient does not make urine  ?Musculoskeletal:  Positive for myalgias.  ?Neurological:  Positive for weakness. Negative for dizziness and headaches.  ?All other systems reviewed and are negative. ? ?Physical Exam ?Updated Vital  Signs ?BP (!) 132/101 (BP Location: Right Arm)   Pulse 79   Temp 97.7 ?F (36.5 ?C) (Oral)   Resp 18   Ht '5\' 2"'$  (1.575 m)   Wt 93 kg   SpO2 97%   BMI 37.49 kg/m?  ?Physical Exam ?Vitals reviewed.  ?Constitutional:   ?   Appearance: She is well-developed. She is obese.  ?HENT:  ?   Head: Normocephalic and atraumatic.  ?Eyes:  ?   Extraocular Movements: Extraocular movements intact.  ?   Pupils: Pupils are equal, round, and reactive to light.  ?Cardiovascular:  ?   Rate and Rhythm: Regular rhythm. Bradycardia present.  ?   Heart sounds: Murmur heard.  ?Pulmonary:  ?   Effort: Pulmonary effort is normal. No tachypnea.  ?   Comments: Crackles in lung bases bilaterally ?Abdominal:  ?   General: There is no abdominal bruit.  ?   Palpations: Abdomen is soft.  ?Musculoskeletal:     ?   General: Normal range of motion.  ?   Cervical back: Normal range of motion and neck supple.  ?   Right lower leg: Edema present.  ?   Left lower leg: Edema present.  ?Skin: ?   General: Skin is warm and dry.  ?   Capillary Refill: Capillary refill takes less than 2 seconds.  ?Neurological:  ?   General: No focal deficit present.  ?   Mental Status: She is alert.  ? ? ?ED Results / Procedures / Treatments   ?Labs ?(all labs ordered are listed, but only abnormal results are displayed) ?Labs Reviewed  ?BASIC METABOLIC PANEL - Abnormal; Notable for the following components:  ?    Result Value  ? Sodium 133 (*)   ? Potassium 7.2 (*)   ? CO2 17 (*)   ? BUN 71 (*)   ? Creatinine, Ser 15.23 (*)   ? Calcium 8.4 (*)   ? GFR, Estimated 2 (*)   ? All other components within normal limits  ?CBC - Abnormal; Notable for the following components:  ? WBC 12.7 (*)   ? All other components within normal limits  ?TROPONIN I (HIGH SENSITIVITY) - Abnormal; Notable for the following components:  ? Troponin I (High Sensitivity) 19 (*)   ? All other components within normal limits  ?TROPONIN I (HIGH SENSITIVITY)  ? ? ?EKG ?EKG  Interpretation ? ?Date/Time:  Wednesday May 01 2022 07:57:58 EDT ?Ventricular Rate:  79 ?PR Interval:  236 ?QRS Duration: 82 ?QT Interval:  378 ?QTC Calculation: 433 ?R Axis:   -75 ?Text Interpretation: Sinus rhythm with 1st degree A-V block Left axis deviation Septal infarct , age undetermined Abnormal ECG When compared with ECG of 16-Jul-2021 23:25, PREVIOUS ECG IS PRESENT t waves more peaked than previous Confirmed by Isla Pence [phone]) on 05/01/2022 10:59:49 AM ? ?Radiology ?DG Chest  2 View ? ?Result Date: 05/01/2022 ?CLINICAL DATA:  Chest pain, nonradiating chest pain in a 66 year old female. EXAM: CHEST - 2 VIEW COMPARISON:  July 17, 2021. FINDINGS: LEFT-sided dialysis catheter terminates in the RIGHT atrium after air via LEFT IJ approach. Trachea is midline. Cardiomediastinal contours and hilar structures with mild cardiac enlargement and central pulmonary vascular engorgement similar to the prior study. No frank edema. No consolidation. No sign of pleural effusion. On limited assessment there is no acute skeletal process. IMPRESSION: Mild cardiac enlargement and central pulmonary vascular engorgement. Electronically Signed   By: Zetta Bills M.D.   On: 05/01/2022 08:31  ? ?DG Hip Unilat W or Wo Pelvis 2-3 Views Left ? ?Result Date: 04/29/2022 ?CLINICAL DATA:  Left-sided pain EXAM: DG HIP (WITH OR WITHOUT PELVIS) 2-3V LEFT COMPARISON:  None. FINDINGS: SI joints are non widened. Pubic symphysis is intact. There are vascular calcifications. No fracture or malalignment. IMPRESSION: No acute osseous abnormality. Electronically Signed   By: Donavan Foil M.D.   On: 04/29/2022 18:45   ? ?Procedures ?Procedures  ?Continuous cardiac monitoring while in the emergency department ?Medications Ordered in ED ?Medications - No data to display ? ?ED Course/ Medical Decision Making/ A&P ?  ?                        ?Medical Decision Making ?Patient is a 66 year old female with a past medical history of ESRD on dialysis MWF  presenting after going to dialysis.  She was told they could not completed dialysis today.  She has been having fatigue and while trying to get access this morning she had an episode of chest tightness with associated shortness of breath.  Tightness

## 2022-05-01 NOTE — ED Notes (Signed)
Pt transported to HD. Family took all belongings. Consent signed. ?

## 2022-05-01 NOTE — Discharge Instructions (Addendum)
Please get your dialysis as scheduled.  Your potassium level was elevated before dialysis today and now is normal. ? ?Your heart enzyme tests are normal. ? ?You need to see cardiology for follow-up. ? ?Return to ER if you have worse chest pain, trouble breathing, ?

## 2022-05-02 ENCOUNTER — Encounter (HOSPITAL_COMMUNITY)
Admission: EM | Disposition: A | Discharge status: Home / Self Care | Payer: Self-pay | Source: Home / Self Care | Attending: Internal Medicine

## 2022-05-02 DIAGNOSIS — I2510 Atherosclerotic heart disease of native coronary artery without angina pectoris: Secondary | ICD-10-CM | POA: Diagnosis not present

## 2022-05-02 DIAGNOSIS — D689 Coagulation defect, unspecified: Secondary | ICD-10-CM | POA: Diagnosis not present

## 2022-05-02 DIAGNOSIS — Z888 Allergy status to other drugs, medicaments and biological substances status: Secondary | ICD-10-CM | POA: Diagnosis not present

## 2022-05-02 DIAGNOSIS — Z85038 Personal history of other malignant neoplasm of large intestine: Secondary | ICD-10-CM | POA: Diagnosis not present

## 2022-05-02 DIAGNOSIS — E875 Hyperkalemia: Secondary | ICD-10-CM | POA: Diagnosis not present

## 2022-05-02 DIAGNOSIS — I132 Hypertensive heart and chronic kidney disease with heart failure and with stage 5 chronic kidney disease, or end stage renal disease: Secondary | ICD-10-CM | POA: Diagnosis not present

## 2022-05-02 DIAGNOSIS — R079 Chest pain, unspecified: Secondary | ICD-10-CM | POA: Diagnosis not present

## 2022-05-02 DIAGNOSIS — E669 Obesity, unspecified: Secondary | ICD-10-CM | POA: Diagnosis present

## 2022-05-02 DIAGNOSIS — R0789 Other chest pain: Secondary | ICD-10-CM | POA: Diagnosis not present

## 2022-05-02 DIAGNOSIS — I509 Heart failure, unspecified: Secondary | ICD-10-CM | POA: Diagnosis not present

## 2022-05-02 DIAGNOSIS — N186 End stage renal disease: Secondary | ICD-10-CM | POA: Diagnosis not present

## 2022-05-02 DIAGNOSIS — D631 Anemia in chronic kidney disease: Secondary | ICD-10-CM | POA: Diagnosis not present

## 2022-05-02 DIAGNOSIS — E785 Hyperlipidemia, unspecified: Secondary | ICD-10-CM | POA: Diagnosis not present

## 2022-05-02 DIAGNOSIS — N2581 Secondary hyperparathyroidism of renal origin: Secondary | ICD-10-CM | POA: Diagnosis not present

## 2022-05-02 DIAGNOSIS — R778 Other specified abnormalities of plasma proteins: Secondary | ICD-10-CM | POA: Insufficient documentation

## 2022-05-02 DIAGNOSIS — R7989 Other specified abnormal findings of blood chemistry: Secondary | ICD-10-CM | POA: Insufficient documentation

## 2022-05-02 DIAGNOSIS — Y712 Prosthetic and other implants, materials and accessory cardiovascular devices associated with adverse incidents: Secondary | ICD-10-CM | POA: Diagnosis present

## 2022-05-02 DIAGNOSIS — G8929 Other chronic pain: Secondary | ICD-10-CM | POA: Diagnosis present

## 2022-05-02 DIAGNOSIS — Z6837 Body mass index (BMI) 37.0-37.9, adult: Secondary | ICD-10-CM | POA: Diagnosis not present

## 2022-05-02 DIAGNOSIS — Z86711 Personal history of pulmonary embolism: Secondary | ICD-10-CM | POA: Diagnosis not present

## 2022-05-02 DIAGNOSIS — I455 Other specified heart block: Secondary | ICD-10-CM | POA: Diagnosis present

## 2022-05-02 DIAGNOSIS — F17210 Nicotine dependence, cigarettes, uncomplicated: Secondary | ICD-10-CM | POA: Diagnosis present

## 2022-05-02 DIAGNOSIS — D6859 Other primary thrombophilia: Secondary | ICD-10-CM | POA: Diagnosis present

## 2022-05-02 DIAGNOSIS — J449 Chronic obstructive pulmonary disease, unspecified: Secondary | ICD-10-CM | POA: Diagnosis present

## 2022-05-02 DIAGNOSIS — M25552 Pain in left hip: Secondary | ICD-10-CM | POA: Diagnosis present

## 2022-05-02 DIAGNOSIS — G4733 Obstructive sleep apnea (adult) (pediatric): Secondary | ICD-10-CM | POA: Diagnosis present

## 2022-05-02 DIAGNOSIS — I120 Hypertensive chronic kidney disease with stage 5 chronic kidney disease or end stage renal disease: Secondary | ICD-10-CM | POA: Diagnosis present

## 2022-05-02 DIAGNOSIS — N250 Renal osteodystrophy: Secondary | ICD-10-CM | POA: Diagnosis not present

## 2022-05-02 DIAGNOSIS — I10 Essential (primary) hypertension: Secondary | ICD-10-CM | POA: Diagnosis not present

## 2022-05-02 DIAGNOSIS — Z86718 Personal history of other venous thrombosis and embolism: Secondary | ICD-10-CM | POA: Diagnosis not present

## 2022-05-02 DIAGNOSIS — I214 Non-ST elevation (NSTEMI) myocardial infarction: Secondary | ICD-10-CM | POA: Diagnosis not present

## 2022-05-02 DIAGNOSIS — Z992 Dependence on renal dialysis: Secondary | ICD-10-CM | POA: Diagnosis not present

## 2022-05-02 DIAGNOSIS — I739 Peripheral vascular disease, unspecified: Secondary | ICD-10-CM | POA: Diagnosis not present

## 2022-05-02 DIAGNOSIS — Z91041 Radiographic dye allergy status: Secondary | ICD-10-CM | POA: Diagnosis not present

## 2022-05-02 DIAGNOSIS — T8241XA Breakdown (mechanical) of vascular dialysis catheter, initial encounter: Secondary | ICD-10-CM | POA: Diagnosis not present

## 2022-05-02 DIAGNOSIS — R7881 Bacteremia: Secondary | ICD-10-CM | POA: Diagnosis not present

## 2022-05-02 HISTORY — PX: LEFT HEART CATH AND CORONARY ANGIOGRAPHY: CATH118249

## 2022-05-02 LAB — TROPONIN I (HIGH SENSITIVITY)
Troponin I (High Sensitivity): 80 ng/L — ABNORMAL HIGH (ref <18)
Troponin I (High Sensitivity): 412 ng/L (ref <18)
Troponin I (High Sensitivity): 574 ng/L (ref <18)

## 2022-05-02 LAB — CBG MONITORING, ED: Glucose-Capillary: 82 mg/dL (ref 70–99)

## 2022-05-02 LAB — HEPATITIS B SURFACE ANTIBODY, QUANTITATIVE: Hep B S AB Quant (Post): <3.1 m[IU]/mL — ABNORMAL LOW (ref >9.9)

## 2022-05-02 SURGERY — LEFT HEART CATH AND CORONARY ANGIOGRAPHY
Anesthesia: LOCAL

## 2022-05-02 MED ORDER — PANTOPRAZOLE SODIUM 40 MG PO TBEC
40.0000 mg | DELAYED_RELEASE_TABLET | Freq: Every day | ORAL | Status: DC
  Administered 2022-05-02 – 2022-05-03 (×2): 40 mg via ORAL
  Filled 2022-05-02 (×2): qty 1

## 2022-05-02 MED ORDER — ONDANSETRON HCL 4 MG/2ML IJ SOLN: 4.0000 mg | Freq: Four times a day (QID) | INTRAMUSCULAR | Status: DC | PRN

## 2022-05-02 MED ORDER — HEPARIN (PORCINE) IN NACL 1000-0.9 UT/500ML-% IV SOLN
INTRAVENOUS | Status: DC | PRN
  Administered 2022-05-02 (×2): 500 mL

## 2022-05-02 MED ORDER — ACETAMINOPHEN 500 MG PO TABS: 1000.0000 mg | ORAL_TABLET | Freq: Three times a day (TID) | ORAL | Status: DC

## 2022-05-02 MED ORDER — ONDANSETRON HCL 4 MG/2ML IJ SOLN
4.0000 mg | Freq: Four times a day (QID) | INTRAMUSCULAR | Status: DC | PRN
  Administered 2022-05-02: 4 mg via INTRAVENOUS
  Filled 2022-05-02: qty 2

## 2022-05-02 MED ORDER — LIDOCAINE HCL (PF) 1 % IJ SOLN
INTRAMUSCULAR | Status: AC
  Filled 2022-05-02: qty 30

## 2022-05-02 MED ORDER — DIPHENHYDRAMINE HCL 50 MG/ML IJ SOLN
INTRAMUSCULAR | Status: AC
  Filled 2022-05-02: qty 1

## 2022-05-02 MED ORDER — ALPRAZOLAM 0.25 MG PO TABS: 0.2500 mg | ORAL_TABLET | Freq: Three times a day (TID) | ORAL | Status: DC | PRN

## 2022-05-02 MED ORDER — VERAPAMIL HCL 2.5 MG/ML IV SOLN
INTRAVENOUS | Status: DC | PRN
  Administered 2022-05-02: 5 mL via INTRA_ARTERIAL

## 2022-05-02 MED ORDER — ACETAMINOPHEN 325 MG PO TABS: 650.0000 mg | ORAL_TABLET | ORAL | Status: DC | PRN

## 2022-05-02 MED ORDER — SODIUM CHLORIDE 0.9% FLUSH: 3.0000 mL | INTRAVENOUS | Status: DC | PRN

## 2022-05-02 MED ORDER — HEPARIN SODIUM (PORCINE) 1000 UNIT/ML IJ SOLN
INTRAMUSCULAR | Status: AC
  Filled 2022-05-02: qty 10

## 2022-05-02 MED ORDER — HYDRALAZINE HCL 20 MG/ML IJ SOLN: 10.0000 mg | INTRAMUSCULAR | Status: AC | PRN

## 2022-05-02 MED ORDER — MIDAZOLAM HCL 2 MG/2ML IJ SOLN
INTRAMUSCULAR | Status: AC
  Filled 2022-05-02: qty 2

## 2022-05-02 MED ORDER — METHYLPREDNISOLONE SODIUM SUCC 125 MG IJ SOLR
INTRAMUSCULAR | Status: AC
  Filled 2022-05-02: qty 2

## 2022-05-02 MED ORDER — METHYLPREDNISOLONE SODIUM SUCC 125 MG IJ SOLR
INTRAMUSCULAR | Status: DC | PRN
  Administered 2022-05-02: 125 mg via INTRAVENOUS

## 2022-05-02 MED ORDER — DIPHENHYDRAMINE HCL 50 MG/ML IJ SOLN
INTRAMUSCULAR | Status: DC | PRN
  Administered 2022-05-02: 25 mg via INTRAVENOUS

## 2022-05-02 MED ORDER — HYDROCORTISONE SOD SUC (PF) 100 MG IJ SOLR
100.0000 mg | INTRAMUSCULAR | Status: DC
  Filled 2022-05-02: qty 2

## 2022-05-02 MED ORDER — APIXABAN 2.5 MG PO TABS
2.5000 mg | ORAL_TABLET | Freq: Two times a day (BID) | ORAL | Status: DC
  Administered 2022-05-02 – 2022-05-03 (×4): 2.5 mg via ORAL
  Filled 2022-05-02 (×4): qty 1

## 2022-05-02 MED ORDER — ALBUTEROL SULFATE (2.5 MG/3ML) 0.083% IN NEBU
3.0000 mL | INHALATION_SOLUTION | Freq: Four times a day (QID) | RESPIRATORY_TRACT | Status: DC | PRN
  Administered 2022-05-03: 3 mL via RESPIRATORY_TRACT
  Filled 2022-05-02: qty 3

## 2022-05-02 MED ORDER — IOHEXOL 350 MG/ML SOLN
INTRAVENOUS | Status: DC | PRN
  Administered 2022-05-02: 30 mL

## 2022-05-02 MED ORDER — FENTANYL CITRATE PF 50 MCG/ML IJ SOSY
25.0000 ug | PREFILLED_SYRINGE | INTRAMUSCULAR | Status: DC | PRN
  Administered 2022-05-02 (×2): 50 ug via INTRAVENOUS
  Filled 2022-05-02 (×2): qty 1

## 2022-05-02 MED ORDER — FENTANYL CITRATE (PF) 100 MCG/2ML IJ SOLN
INTRAMUSCULAR | Status: AC
  Filled 2022-05-02: qty 2

## 2022-05-02 MED ORDER — MIDAZOLAM HCL 2 MG/2ML IJ SOLN
INTRAMUSCULAR | Status: DC | PRN
  Administered 2022-05-02: 1 mg via INTRAVENOUS

## 2022-05-02 MED ORDER — FENTANYL CITRATE (PF) 100 MCG/2ML IJ SOLN
INTRAMUSCULAR | Status: DC | PRN
  Administered 2022-05-02 (×2): 25 ug via INTRAVENOUS

## 2022-05-02 MED ORDER — OXYCODONE-ACETAMINOPHEN 5-325 MG PO TABS: 1.0000 | ORAL_TABLET | ORAL | Status: DC | PRN

## 2022-05-02 MED ORDER — SODIUM CHLORIDE 0.9% FLUSH
3.0000 mL | Freq: Two times a day (BID) | INTRAVENOUS | Status: DC
  Administered 2022-05-02: 3 mL via INTRAVENOUS

## 2022-05-02 MED ORDER — HEPARIN SODIUM (PORCINE) 1000 UNIT/ML IJ SOLN
INTRAMUSCULAR | Status: DC | PRN
Start: 2022-05-02 — End: 2022-05-02
  Administered 2022-05-02: 2000 [IU] via INTRAVENOUS

## 2022-05-02 MED ORDER — HEPARIN (PORCINE) IN NACL 1000-0.9 UT/500ML-% IV SOLN
INTRAVENOUS | Status: AC
Start: 2022-05-02 — End: ?
  Filled 2022-05-02: qty 1000

## 2022-05-02 MED ORDER — SODIUM CHLORIDE 0.9% FLUSH: 3.0000 mL | Freq: Two times a day (BID) | INTRAVENOUS | Status: DC

## 2022-05-02 MED ORDER — SODIUM CHLORIDE 0.9 % IV SOLN: 250.0000 mL | INTRAVENOUS | Status: DC | PRN

## 2022-05-02 MED ORDER — OXYCODONE-ACETAMINOPHEN 5-325 MG PO TABS
1.0000 | ORAL_TABLET | ORAL | Status: DC | PRN
Start: 2022-05-02 — End: 2022-05-03
  Administered 2022-05-02: 1 via ORAL
  Filled 2022-05-02: qty 1

## 2022-05-02 MED ORDER — ATORVASTATIN CALCIUM 10 MG PO TABS
10.0000 mg | ORAL_TABLET | Freq: Every day | ORAL | Status: DC
Start: 2022-05-02 — End: 2022-05-03
  Administered 2022-05-03: 10 mg via ORAL
  Filled 2022-05-02 (×2): qty 1

## 2022-05-02 MED ORDER — FERRIC CITRATE 1 GM 210 MG(FE) PO TABS
420.0000 mg | ORAL_TABLET | Freq: Three times a day (TID) | ORAL | Status: DC
  Filled 2022-05-02 (×4): qty 2

## 2022-05-02 MED ORDER — ASPIRIN 81 MG PO CHEW
81.0000 mg | CHEWABLE_TABLET | Freq: Every day | ORAL | Status: DC
  Administered 2022-05-03: 81 mg via ORAL
  Filled 2022-05-02 (×2): qty 1

## 2022-05-02 MED ORDER — LIDOCAINE HCL (PF) 1 % IJ SOLN
INTRAMUSCULAR | Status: DC | PRN
  Administered 2022-05-02: 2 mL via INTRADERMAL

## 2022-05-02 MED ORDER — VERAPAMIL HCL 2.5 MG/ML IV SOLN
INTRAVENOUS | Status: AC
  Filled 2022-05-02: qty 2

## 2022-05-02 MED ORDER — HYDROCORTISONE SOD SUC (PF) 100 MG IJ SOLR: 100.0000 mg | INTRAMUSCULAR | Status: DC

## 2022-05-02 SURGICAL SUPPLY — 9 items
CATH OPTITORQUE TIG 4.0 5F (CATHETERS) ×1 IMPLANT
DEVICE RAD COMP TR BAND LRG (VASCULAR PRODUCTS) ×1 IMPLANT
GLIDESHEATH SLEND A-KIT 6F 22G (SHEATH) ×1 IMPLANT
GUIDEWIRE INQWIRE 1.5J.035X260 (WIRE) IMPLANT
INQWIRE 1.5J .035X260CM (WIRE) ×2
KIT HEART LEFT (KITS) ×2 IMPLANT
PACK CARDIAC CATHETERIZATION (CUSTOM PROCEDURE TRAY) ×2 IMPLANT
TRANSDUCER W/STOPCOCK (MISCELLANEOUS) ×2 IMPLANT
TUBING CIL FLEX 10 FLL-RA (TUBING) ×2 IMPLANT

## 2022-05-02 NOTE — ED Notes (Signed)
This RN messaged [facility] RN regarding low bp's MD is aware and is not wanting to start another fluid bolus at this time. This RN will continue to assess blood pressures and will notify MD if they continue to trend downward.  ?

## 2022-05-02 NOTE — Progress Notes (Addendum)
Pt receives out-pt HD at [facility] on MWF. Contacted clinic to inquire about pt's arrival/chair time and awaiting response. Will assist as needed.  ? ?Melven Sartorius ?Renal Navigator ?[phone] ? ?Addendum at 3:19 pm: ?Pt arrives at clinic at 5:40 for 6:00 chair time.  ?

## 2022-05-02 NOTE — H&P (Signed)
?History and Physical  ? ? ?Whitney Walker JME:[id number] DOB: 02-Oct-1956 DOA: 05/01/2022 ? ?PCP: [name], FNP  ? ?Patient coming from: Home  ? ?Chief Complaint: Dialysis catheter problem, chest pain, hip pain  ? ?HPI: Whitney Walker is a pleasant 66 y.o. female with medical history significant for COPD, history of DVT and PE on Eliquis, OSA not on CPAP, PAD, and ESRD on hemodialysis who presented to the emergency department this morning from her dialysis center after the dialysis catheter in the left chest was not functioning.  Patient reports that she has been experiencing increase in her chronic left hip pain for the past week or so, went to dialysis this morning where there was some problems with the catheter in her left chest which seem to be working when the patient kept her head and neck in a certain position, but it then malfunctioned again and the patient complained of chest pain which prompted her presentation to the ED.  She was hyperkalemic in the ED today, was treated with dextrose, insulin, and Lokelma.  She was evaluated by nephrology in the emergency department this morning, went to IR where she reports being instructed to cough forcefully several times, and then underwent hemodialysis.  Patient reports that her chest pain is localized to the left chest, worse with cough or deep breath, and she attributes it to manipulation of the dialysis catheter and the forceful coughing earlier today.  She denies missing any doses of Eliquis recently. ? ?ED Course: Upon arrival to the ED, patient is found to be afebrile and saturating upper 90s on room air with systolic blood pressure in the 90s.  EKG features sinus rhythm with first-degree AV nodal block.  Chest x-ray negative for acute cardiopulmonary disease.  Chemistry panel with normal potassium and bicarbonate.  Troponin is 60, up from 16 earlier today.  Patient was given IV calcium, fentanyl, Zofran, and 500 cc of saline in the ED. ? ?Review  of Systems:  ?All other systems reviewed and apart from HPI, are negative. ? ?Past Medical History:  ?Diagnosis Date  ? Anemia   ? Anxiety   ? CHF (congestive heart failure) ([place])   ? Colon cancer ([place])   ? treatment surgery  ? Complication of anesthesia   ? after first C- [place] "couldnt walk after", patient denies having a spinal  ? COPD (chronic obstructive pulmonary disease) ([place])   ? Coronary artery disease   ? Depression   ? DVT (deep venous thrombosis) ([place])   ? ESRD on hemodialysis ([place])   ? Hemo: MWF  ? History of blood transfusion 04/2018  ? Hypertension   ? 07/07/18- no longer takes BP medications  ? Meningitis   ? Pain in limb 07/30/2013  ? PE (pulmonary embolism)   ? Peripheral vascular disease ([place])   ? Restless legs   ? Shortness of breath   ? with exertion  ? Sleep apnea   ? SOB (shortness of breath) 03/03/2019  ? Vertigo   ? ? ?Past Surgical History:  ?Procedure Laterality Date  ? ABDOMINAL AORTAGRAM N/A 07/26/2013  ? Procedure: ABDOMINAL AORTAGRAM;  Surgeon: [name] [place], MD;  Location: [facility] CATH LAB;  Service: Cardiovascular;  Laterality: N/A;  ? ABDOMINAL HYSTERECTOMY    ? AV FISTULA PLACEMENT Left 05/05/2018  ? Procedure: ARTERIOVENOUS (AV) FISTULA CREATION BRACHIOCEPHALIC;  Surgeon: Waynetta Sandy, MD;  Location: [place];  Service: Vascular;  Laterality: Left;  ? AV FISTULA PLACEMENT Left 07/16/2018  ? Procedure: ARTERIOVENOUS  FISTULA CREATION;  Surgeon: Waynetta Sandy, MD;  Location: [place];  Service: Vascular;  Laterality: Left;  ? BASCILIC VEIN TRANSPOSITION Left 09/03/2018  ? Procedure: BASILIC VEIN TRANSPOSITION SECOND STAGE;  Surgeon: Waynetta Sandy, MD;  Location: [place];  Service: Vascular;  Laterality: Left;  ? BREAST BIOPSY Left   ? CESAREAN SECTION    ? X 3 [id number]  ? CHOLECYSTECTOMY    ? CHOLECYSTECTOMY  1980/s  ? COLECTOMY  2010  ? DIVERTICULOSIS SURGERY-2002  2012  ? FEMORAL-POPLITEAL BYPASS GRAFT Left 08/13/2013  ? Procedure: BYPASS GRAFT FEMORAL-POPLITEAL  ARTERY WITH NON-REVERSED SAPHANEOUS VEIN; ULTRASOUND GUIDED;  Surgeon: Mal [name], MD;  Location: [place];  Service: Vascular;  Laterality: Left;  ? INSERTION OF DIALYSIS CATHETER Right 04/30/2018  ? Procedure: INSERTION OF Right Internal Jugular DIALYSIS CATHETER;  Surgeon: [name], MD;  Location: [facility];  Service: Vascular;  Laterality: Right;  ? IR FLUORO GUIDE CV LINE RIGHT  07/24/2020  ? IR FLUORO GUIDE CV LINE RIGHT  07/21/2021  ? IR REMOVAL TUN CV CATH W/O FL  07/21/2020  ? IR REMOVAL TUN CV CATH W/O FL  07/19/2021  ? IR US GUIDE VASC ACCESS RIGHT  07/24/2020  ? IR US GUIDE VASC ACCESS RIGHT  07/21/2021  ? LEFT HEART CATH AND CORONARY ANGIOGRAPHY N/A 05/01/2021  ? Procedure: LEFT HEART CATH AND CORONARY ANGIOGRAPHY;  Surgeon: [name], MD;  Location: [place] CV LAB;  Service: Cardiovascular;  Laterality: N/A;  ? LOWER EXTREMITY ANGIOGRAM Left 07/26/2013  ? Procedure: LOWER EXTREMITY ANGIOGRAM;  Surgeon: [name] [place], MD;  Location: [facility] CATH LAB;  Service: Cardiovascular;  Laterality: Left;  ? ? ?Social History:  ? reports that she has been smoking cigarettes. She has a 25.00 pack-year smoking history. She has quit using smokeless tobacco. She reports that she does not currently use drugs after having used the following drugs: Oxycodone. She reports that she does not drink alcohol. ? ?Allergies  ?Allergen Reactions  ? Carnosine   ?  Other reaction(s): Unknown  ? Gadolinium Derivatives Hives and Other (See Comments)  ?  HIVES, Desc: HIVES W/ "DYE" USED FOR 1ST CT SCAN BUT NOT 2ND, NO PREMEDS USED, PT UNCERTAIN OF CIRCUMSTANCES,,?POSSIBLE MRI CONTRAST ALLERGY, ALL STUDIES DONE "SOMEWHERE" IN [place]//A.C., Onset Date: [id number]  ? Iohexol Other (See Comments)  ?   Code: HIVES, Desc: HIVES W/ "DYE" USED FOR 1ST CT SCAN BUT NOT 2ND, NO PREMEDS USED, PT UNCERTAIN OF CIRCUMSTANCES,,?POSSIBLE MRI CONTRAST ALLERGY, ALL STUDIES DONE "SOMEWHERE" IN [place]//A.C., Onset Date: [id number] ?  ? Iodinated Contrast  Media Hives  ? Iodine Hives  ? Naltrexone   ?  Other reaction(s): Unknown  ? ? ?Family History  ?Problem Relation Age of Onset  ? Cancer Mother 69  ?     breast and bone  ? Cancer Father 7  ?     prostate  ? Hypertension Sister   ? Bleeding Disorder Sister   ? Cancer Cousin 20  ?     breast cancer   ? Hypertension Daughter   ? Breast cancer Neg Hx   ? ? ? ?Prior to Admission medications   ?Medication Sig Start Date End Date Taking? Authorizing Provider  ?acetaminophen (TYLENOL) 500 MG tablet Take 1,000 mg by mouth 2 (two) times daily as needed for moderate pain or headache.    [provider]  ?albuterol (VENTOLIN HFA) 108 (90 Base) MCG/ACT inhaler Inhale 2 puffs into the lungs every 6 (six)  hours as needed for wheezing or shortness of breath. 08/03/20   [name], FNP  ?Ascorbic Acid (VITAMIN C) 100 MG tablet Take 100 mg by mouth daily.    [provider]  ?atorvastatin (LIPITOR) 10 MG tablet Take 1 tablet (10 mg total) by mouth daily. 02/19/22   Cantwell, Celeste C, PA-C  ?AURYXIA 1 GM 210 MG(Fe) tablet Take 420-840 mg by mouth with breakfast, with lunch, and with evening meal. Taking  2 tablets daily ('420mg'$ ) with each snack 05/15/21   [provider]  ?Biotin 10000 MCG TABS Take 10,000 mcg by mouth daily.     [provider]  ?carvedilol (COREG) 12.5 MG tablet Take 1 tablet (12.5 mg total) by mouth 2 (two) times daily. 11/27/21   Cantwell, Celeste C, PA-C  ?cyclobenzaprine (FLEXERIL) 10 MG tablet Take 1 tablet (10 mg total) by mouth 3 (three) times daily as needed for muscle spasms. 11/15/21   [name], FNP  ?ELIQUIS 2.5 MG TABS tablet TAKE ONE TABLET BY MOUTH TWICE DAILY 04/15/22   [name], FNP  ?meclizine (ANTIVERT) 12.5 MG tablet Take 1 tablet (12.5 mg total) by mouth 3 (three) times daily as needed for dizziness. 04/12/21   Glendale Chard, MD  ?mupirocin ointment (BACTROBAN) 2 % Apply 1 application topically daily as needed (skin bumps). 04/15/19   [name],  [place]  ?omeprazole (PRILOSEC) 20 MG capsule TAKE ONE CAPSULE BY MOUTH ONCE DAILY 04/26/22   Truitt Merle, MD  ?oxyCODONE-acetaminophen (PERCOCET) 5-325 MG tablet Take 1 tablet by mouth every 4 (four) hours as

## 2022-05-02 NOTE — Progress Notes (Signed)
?  [facility] ?Progress Note  ? ?Subjective:  ?Had dialysis yesterday -got 1L bolus on HD.  ?Seen in ED. No chest pain, dyspnea this am.  ?Cardiology consulted for elevated trop ? ?Objective ?Vitals:  ? 05/02/22 0630 05/02/22 0700 05/02/22 0730 05/02/22 0800  ?BP: 130/73 109/72 119/68 (!) 93/54  ?Pulse: (!) 107 (!) 108 (!) 103 (!) 104  ?Resp: 18 20 (!) 22 15  ?Temp:      ?TempSrc:      ?SpO2: 97% 100% 98% 100%  ?Weight:      ?Height:      ?  ? ? ?Additional Objective ?Labs: ?Basic Metabolic Panel: ?Recent Labs  ?Lab 05/01/22 ?4193 05/01/22 ?1357 05/01/22 ?2219  ?NA 133* 132* 136  ?K 7.2* 7.2* 3.8  ?CL 101 103 101  ?CO2 17* 13*  --   ?GLUCOSE 95 60* 60*  ?BUN 71* 74* 27*  ?CREATININE 15.23* 15.55* 7.90*  ?CALCIUM 8.4* 7.9*  --   ? ?CBC: ?Recent Labs  ?Lab 05/01/22 ?7902 05/01/22 ?2219  ?WBC 12.7*  --   ?HGB 13.1 12.6  ?HCT 42.1 37.0  ?MCV 95.9  --   ?PLT 167  --   ? ?Blood Culture ?   ?Component Value Date/Time  ? SDES BLOOD RIGHT UPPER ARM 07/19/2021 1648  ? SPECREQUEST  07/19/2021 1648  ?  BOTTLES DRAWN AEROBIC ONLY Blood Culture results may not be optimal due to an inadequate volume of blood received in culture bottles  ? CULT  07/19/2021 1648  ?  NO GROWTH 5 DAYS ?Performed at [facility], [place] [address] [place], [place] [address] ?  ? REPTSTATUS 07/24/2021 FINAL 07/19/2021 1648  ? ? ? ?Physical Exam ?General: Alert, nad  ?Heart: RRR no m,r,g  ?Lungs: Clear, bilaterally  ?Abdomen: soft non-tender  ?Extremities: no LE edema  ?Dialysis Access: L IJ TDC  ? ?Medications: ? ? acetaminophen  1,000 mg Oral Q8H  ? apixaban  2.5 mg Oral BID  ? aspirin  81 mg Oral Daily  ? atorvastatin  10 mg Oral Daily  ? Chlorhexidine Gluconate Cloth  6 each Topical Q0600  ? [START ON 05/03/2022] doxercalciferol  5 mcg Intravenous Q M,W,F-HD  ? pantoprazole  40 mg Oral Daily  ? ? ?Dialysis Orders:  ?[facility] ? 3.5h  92.5kg   400/1.5   2/2.5 bath  TDC [facility]  Heparin 6000 ? - last Hb 11.7 on 4/26 ? - no esa or Fe ? -  hectorol 5 ug IV tiw ?  ? ?Assessment/Plan: ?Hyperkalemia - Improved s/p meds, HD yesterday.  ?Malfunctioning HD access - positional access problems at dialysis center. Ord with dialysis yesterday.  ?Chest pain - had LHC in 2022 w/ no sig CAD (max lesions were 30%). Trops trending up here -cardiology consulting. For possible cardiac cath.  ?ESRD - on HD MWF.  Continue on schedule. Next HD 5/5  ?Hx DVT / PE - on eliquis ?HTN -  Low BPs yesterday.  Got bolus on HD. No gross vol overload. Minimal UF on HD.  ?Anemia esrd - Hb 13, no esa needs ?MBD ckd - Ca in range, will add on phos/ albumin.Cont Auryxia binder.  ? ?Lynnda Child PA-C ?[facility] ?05/02/2022,2:09 PM ? ? ? ? ? ? ? ?

## 2022-05-02 NOTE — Consult Note (Signed)
CARDIOLOGY CONSULT NOTE  ?Patient ID: ?Whitney Walker ?MRN: [id number] ?DOB/AGE: 04/22/1956 66 y.o. ? ?Admit date: 05/01/2022 ?Referring Physician  Ghimire, Henreitta Leber, MD ?Primary Physician:  [name], FNP ?Reason for Consultation: chest pain, elevated troponin  ? ?Patient ID: Whitney Walker, female    DOB: 1956-11-26, 66 y.o.   MRN: [id number] ? ?Chief Complaint  ?Patient presents with  ? Chest Pain  ? ?HPI:   ? ?Whitney Walker  is a 66 y.o. African-American female with history of obesity, hypertension, hyperlipidemia, tobacco use, stage IV kidney disease on dialysis, obstructive sleep apnea not on CPAP, COPD and severe peripheral arterial disease with history of left femoral-popliteal bypass surgery by Dr. Victorino Dike in 2014.  Patient is on chronic anticoagulation with Eliquis due to recurrent DVT and PE in 2014 and 2016.  Patient is followed by hematology for hypercoagulable state. ? ?Patient presented to [facility] emergency department with chest pain that started in the dialysis center when the dialysis catheter was not functioning and they asker her to cough hard. Patient describes chest tightness like she never had before and lasted only 5 minutes. She has not had recurrence.  Evaluation feeling initial chest x-ray with pulmonary vascular congestion, troponins trending up from 68 presentation to 574, EKG unchanged compared to previous.  Notably patient's hemodialysis catheter was malfunctioning and patient attributed left-sided chest pain to dialysis catheter.  HD catheter was exchanged by IR 05/01/2022. ? ?However repeat troponin still rising. Hence I was consulted. ? ?Past Medical History:  ?Diagnosis Date  ? Anemia   ? Anxiety   ? CHF (congestive heart failure) ([place])   ? Colon cancer ([place])   ? treatment surgery  ? Complication of anesthesia   ? after first C- [place] "couldnt walk after", patient denies having a spinal  ? COPD (chronic obstructive pulmonary disease) ([place])   ? Coronary artery  disease   ? Depression   ? DVT (deep venous thrombosis) ([place])   ? ESRD on hemodialysis ([place])   ? Hemo: MWF  ? History of blood transfusion 04/2018  ? Hypertension   ? 07/07/18- no longer takes BP medications  ? Meningitis   ? Pain in limb 07/30/2013  ? PE (pulmonary embolism)   ? Peripheral vascular disease ([place])   ? Restless legs   ? Shortness of breath   ? with exertion  ? Sleep apnea   ? SOB (shortness of breath) 03/03/2019  ? Vertigo   ? ?Past Surgical History:  ?Procedure Laterality Date  ? ABDOMINAL AORTAGRAM N/A 07/26/2013  ? Procedure: ABDOMINAL AORTAGRAM;  Surgeon: [name] [place], MD;  Location: [facility] CATH LAB;  Service: Cardiovascular;  Laterality: N/A;  ? ABDOMINAL HYSTERECTOMY    ? AV FISTULA PLACEMENT Left 05/05/2018  ? Procedure: ARTERIOVENOUS (AV) FISTULA CREATION BRACHIOCEPHALIC;  Surgeon: Waynetta Sandy, MD;  Location: [place];  Service: Vascular;  Laterality: Left;  ? AV FISTULA PLACEMENT Left 07/16/2018  ? Procedure: ARTERIOVENOUS FISTULA CREATION;  Surgeon: Waynetta Sandy, MD;  Location: [place];  Service: Vascular;  Laterality: Left;  ? BASCILIC VEIN TRANSPOSITION Left 09/03/2018  ? Procedure: BASILIC VEIN TRANSPOSITION SECOND STAGE;  Surgeon: Waynetta Sandy, MD;  Location: [place];  Service: Vascular;  Laterality: Left;  ? BREAST BIOPSY Left   ? CESAREAN SECTION    ? X 3 [id number]  ? CHOLECYSTECTOMY    ? CHOLECYSTECTOMY  1980/s  ? COLECTOMY  2010  ? DIVERTICULOSIS SURGERY-2002  2012  ? FEMORAL-POPLITEAL BYPASS GRAFT  Left 08/13/2013  ? Procedure: BYPASS GRAFT FEMORAL-POPLITEAL ARTERY WITH NON-REVERSED SAPHANEOUS VEIN; ULTRASOUND GUIDED;  Surgeon: Mal [name], MD;  Location: [place];  Service: Vascular;  Laterality: Left;  ? INSERTION OF DIALYSIS CATHETER Right 04/30/2018  ? Procedure: INSERTION OF Right Internal Jugular DIALYSIS CATHETER;  Surgeon: [name], MD;  Location[facility];  Service: Vascular;  Laterality: Right;  ? IR FLUORO GUIDE CV LINE LEFT  05/01/2022  ? IR FLUORO  GUIDE CV LINE RIGHT  07/24/2020  ? IR FLUORO GUIDE CV LINE RIGHT  07/21/2021  ? IR REMOVAL TUN CV CATH W/O FL  07/21/2020  ? IR REMOVAL TUN CV CATH W/O FL  07/19/2021  ? IR US GUIDE VASC ACCESS RIGHT  07/24/2020  ? IR US GUIDE VASC ACCESS RIGHT  07/21/2021  ? LEFT HEART CATH AND CORONARY ANGIOGRAPHY N/A 05/01/2021  ? Procedure: LEFT HEART CATH AND CORONARY ANGIOGRAPHY;  Surgeon: [name], MD;  Location: [place] CV LAB;  Service: Cardiovascular;  Laterality: N/A;  ? LOWER EXTREMITY ANGIOGRAM Left 07/26/2013  ? Procedure: LOWER EXTREMITY ANGIOGRAM;  Surgeon: [name] [place], MD;  Location: [facility] CATH LAB;  Service: Cardiovascular;  Laterality: Left;  ? ?Family History  ?Problem Relation Age of Onset  ? Cancer Mother 92  ?     breast and bone  ? Cancer Father 45  ?     prostate  ? Hypertension Sister   ? Bleeding Disorder Sister   ? Cancer Cousin 20  ?     breast cancer   ? Hypertension Daughter   ? Breast cancer Neg Hx   ? ?Social History  ? ?Tobacco Use  ? Smoking status: Some Days  ?  Packs/day: 0.50  ?  Years: 50.00  ?  Pack years: 25.00  ?  Types: Cigarettes  ?  Last attempt to quit: 02/03/2021  ?  Years since quitting: 1.2  ? Smokeless tobacco: Former  ? Tobacco comments:  ?  She is using a vape  ?Substance Use Topics  ? Alcohol use: No  ?  Alcohol/week: 0.0 standard drinks  ?  Comment: she used to drink alcohol, quit in 2010; h/o heavy use   ?  ?Marital Sttus: Single  ?ROS  ?Review of Systems  ?Constitutional: Negative for malaise/fatigue and weight gain.  ?Cardiovascular:  Negative for chest pain, claudication, dyspnea on exertion, leg swelling, orthopnea, palpitations and paroxysmal nocturnal dyspnea.  ?Neurological:  Negative for dizziness.  ?Objective  ? ? ?  05/02/2022  ?  2:00 PM 05/02/2022  ?  8:00 AM 05/02/2022  ?  7:30 AM  ?Vitals with BMI  ?Systolic 076 93 226  ?Diastolic 74 54 68  ?Pulse 100 104 103  ?  ?Blood pressure 125/74, pulse 100, temperature 98.2 ?F (36.8 ?C), temperature source Oral, resp. rate 14, height 5'  2" (1.575 m), weight 93.3 kg, SpO2 94 %.  ?  ?Physical Exam ?Vitals reviewed.  ?Constitutional:   ?   Appearance: She is obese.  ?HENT:  ?   Head: Normocephalic.  ?Cardiovascular:  ?   Rate and Rhythm: Normal rate and regular rhythm.  ?   Pulses: Intact distal pulses.  ?   Heart sounds: S1 normal and S2 normal. Murmur heard.  ?Harsh midsystolic murmur is present at the upper right sternal border.  ?  No gallop.  ?Pulmonary:  ?   Effort: Pulmonary effort is normal. No respiratory distress.  ?   Breath sounds: No wheezing, rhonchi or rales.  ?  Chest:  ?   Comments: Left infra axillary dialysis catheter noted ?Abdominal:  ?   General: Bowel sounds are normal.  ?   Palpations: Abdomen is soft.  ?Musculoskeletal:  ?   Right lower leg: No edema.  ?   Left lower leg: No edema.  ? ?Laboratory examination:  ? ?Recent Labs  ?  07/22/21 ?2549 11/15/21 ?1145 05/01/22 ?8264 05/01/22 ?1357 05/01/22 ?2219  ?NA 134* 141 133* 132* 136  ?K 4.0 5.3* 7.2* 7.2* 3.8  ?CL 97* 99 101 103 101  ?CO2 22 18* 17* 13*  --   ?GLUCOSE 78 84 95 60* 60*  ?BUN 18 25 71* 74* 27*  ?CREATININE 6.33* 6.96* 15.23* 15.55* 7.90*  ?CALCIUM 7.8* 8.9 8.4* 7.9*  --   ?GFRNONAA 7*  --  2* 2*  --   ? ?estimated creatinine clearance is 7.5 mL/min (A) (by C-G formula based on SCr of 7.9 mg/dL (H)).  ? ?  Latest Ref Rng & Units 05/01/2022  ? 10:19 PM 05/01/2022  ?  1:57 PM 05/01/2022  ?  8:10 AM  ?CMP  ?Glucose 70 - 99 mg/dL 60   60   95    ?BUN 8 - 23 mg/dL 27   74   71    ?Creatinine 0.44 - 1.00 mg/dL 7.90   15.55   15.23    ?Sodium 135 - 145 mmol/L 136   132   133    ?Potassium 3.5 - 5.1 mmol/L 3.8   7.2   7.2    ?Chloride 98 - 111 mmol/L 101   103   101    ?CO2 22 - 32 mmol/L  13   17    ?Calcium 8.9 - 10.3 mg/dL  7.9   8.4    ? ? ?  Latest Ref Rng & Units 05/01/2022  ? 10:19 PM 05/01/2022  ?  8:10 AM 07/22/2021  ? 12:46 AM  ?CBC  ?WBC 4.0 - 10.5 K/uL  12.7   19.9    ?Hemoglobin 12.0 - 15.0 g/dL 12.6   13.1   10.2    ?Hematocrit 36.0 - 46.0 % 37.0   42.1   32.5     ?Platelets 150 - 400 K/uL  167   184    ? ?Lipid Panel ?No results for input(s): CHOL, TRIG, LDLCALC, VLDL, HDL, CHOLHDL, LDLDIRECT in the last 8760 hours.  ?HEMOGLOBIN A1C ?Lab Results  ?Component Value Date  ? H

## 2022-05-02 NOTE — Progress Notes (Signed)
?      ?                 PROGRESS NOTE ? ?      ?PATIENT DETAILS ?Name: Whitney Walker ?Age: 66 y.o. ?Sex: female ?Date of Birth: 1956/01/31 ?Admit Date: 05/01/2022 ?Admitting Physician Vianne Bulls, MD ?[id number], Doreene Burke, [place] ? ?Brief Summary: ?66 year old female with history of ESRD on HD MWF, VTE on Eliquis, chronic left hip pain-who presented from dialysis center-for malfunctioning dialysis catheter-and left-sided chest pain when coughing and on certain positions.  She was subsequently found to have hyperkalemia-IR exchanged her malfunctioning hemodialysis catheter-she underwent hemodialysis-and was subsequently admitted to the hospitalist service for further evaluation. ? ?Significant events: ?5/3>> transfer from HD center to ED for HD catheter malfunction/left-sided chest wall pain-found to have hyperkalemia ? ?Significant studies: ?5/3>> CXR: No PNA ? ?Significant microbiology data: ? ? ?Procedures: ?5/3>> HD catheter exchanged by IR. ? ?Consults: ?Nephrology, IR, cardiology ? ?Subjective: ?No further chest pain.  Feels much better this morning. ? ?Objective: ?Vitals: ?Blood pressure (!) 93/54, pulse (!) 104, temperature 98.2 ?F (36.8 ?C), temperature source Oral, resp. rate 15, height '5\' 2"'$  (1.575 m), weight 93.3 kg, SpO2 100 %.  ? ?Exam: ?Gen Exam:Alert awake-not in any distress ?HEENT:atraumatic, normocephalic ?Chest: B/L clear to auscultation anteriorly ?CVS:S1S2 regular ?Abdomen:soft non tender, non distended ?Extremities:no edema ?Neurology: Non focal ?Skin: no rash ? ?Pertinent Labs/Radiology: ? ?  Latest Ref Rng & Units 05/01/2022  ? 10:19 PM 05/01/2022  ?  8:10 AM 07/22/2021  ? 12:46 AM  ?CBC  ?WBC 4.0 - 10.5 K/uL  12.7   19.9    ?Hemoglobin 12.0 - 15.0 g/dL 12.6   13.1   10.2    ?Hematocrit 36.0 - 46.0 % 37.0   42.1   32.5    ?Platelets 150 - 400 K/uL  167   184    ?  ?Lab Results  ?Component Value Date  ? NA 136 05/01/2022  ? K 3.8 05/01/2022  ? CL 101 05/01/2022  ? CO2 13 (L) 05/01/2022  ?   ? ? ?Assessment/Plan: ?Hyperkalemia: Treated with HD-resolved. ? ?Malfunctioning HD catheter: Evaluated by IR-HD catheter exchanged-subsequently underwent successful dialysis. ?  ?Atypical chest pain: Per patient this occurred with only certain positions when her HD catheter was malfunctioning and with coughing spells.  Chest pain has resolved-however troponins this morning were significantly elevated-cardiology to follow-see below. ? ?Elevated troponins: Not clear whether this is demand ischemia (briefly hypotensive last night) versus non-STEMI.  Cardiologist-Dr. Einar Gip to evaluate shortly and provide further recommendations. ?  ?Chronic left-sided hip pain: Followed by Dr. Tera Helper is seeking a second opinion from [facility].  Continue Tylenol and other narcotics as previous. ?  ?ESRD: On HD MWF.  Nephrology following. ? ?HTN: BP soft overnight-but has stabilized-continue to hold Coreg. ? ?HLD: Continue statin ? ?History of recurrent VTE: Continue Eliquis. ? ?History of PAD s/p left femoral popliteal bypass in 2014 ? ?OSA: Apparently not on CPAP at home. ? ?Obesity: ?Estimated body mass index is 37.62 kg/m? as calculated from the following: ?  Height as of this encounter: '5\' 2"'$  (1.575 m). ?  Weight as of this encounter: 93.3 kg.  ? ?Code status: ?  Code Status: Full Code  ? ?DVT Prophylaxis: ?apixaban (ELIQUIS) tablet 2.5 mg Start: 05/02/22 0100 ?apixaban (ELIQUIS) tablet 2.5 mg   ? ? ?Family Communication: Daughter at bedside ? ? ?Disposition Plan: ?Status is: Observation ?The patient will require care spanning > 2  midnights and should be moved to inpatient because: Elevated troponin-unclear whether this is demand ischemia or non-STEMI-awaiting cardiology evaluation. ?  ?Planned Discharge Destination:Home ? ? ?Diet: ?Diet Order   ? ?       ?  Diet renal with fluid restriction Fluid restriction: 1200 mL Fluid; Room service appropriate? Yes; Fluid consistency: Thin  Diet effective now       ?  ? ?  ?  ? ?  ?   ? ? ?Antimicrobial agents: ?Anti-infectives (From admission, onward)  ? ? None  ? ?  ? ? ? ?MEDICATIONS: ?Scheduled Meds: ? acetaminophen  1,000 mg Oral Q8H  ? apixaban  2.5 mg Oral BID  ? aspirin  81 mg Oral Daily  ? atorvastatin  10 mg Oral Daily  ? Chlorhexidine Gluconate Cloth  6 each Topical Q0600  ? [START ON 05/03/2022] doxercalciferol  5 mcg Intravenous Q M,W,F-HD  ? pantoprazole  40 mg Oral Daily  ? ?Continuous Infusions: ?PRN Meds:.albuterol, ALPRAZolam, fentaNYL (SUBLIMAZE) injection, ondansetron (ZOFRAN) IV, oxyCODONE-acetaminophen ? ? ?I have personally reviewed following labs and imaging studies ? ?LABORATORY DATA: ?CBC: ?Recent Labs  ?Lab 05/01/22 ?1884 05/01/22 ?2219  ?WBC 12.7*  --   ?HGB 13.1 12.6  ?HCT 42.1 37.0  ?MCV 95.9  --   ?PLT 167  --   ? ? ?Basic Metabolic Panel: ?Recent Labs  ?Lab 05/01/22 ?1660 05/01/22 ?1357 05/01/22 ?2219  ?NA 133* 132* 136  ?K 7.2* 7.2* 3.8  ?CL 101 103 101  ?CO2 17* 13*  --   ?GLUCOSE 95 60* 60*  ?BUN 71* 74* 27*  ?CREATININE 15.23* 15.55* 7.90*  ?CALCIUM 8.4* 7.9*  --   ? ? ?GFR: ?Estimated Creatinine Clearance: 7.5 mL/min (A) (by C-G formula based on SCr of 7.9 mg/dL (H)). ? ?Liver Function Tests: ?No results for input(s): AST, ALT, ALKPHOS, BILITOT, PROT, ALBUMIN in the last 168 hours. ?No results for input(s): LIPASE, AMYLASE in the last 168 hours. ?No results for input(s): AMMONIA in the last 168 hours. ? ?Coagulation Profile: ?No results for input(s): INR, PROTIME in the last 168 hours. ? ?Cardiac Enzymes: ?No results for input(s): CKTOTAL, CKMB, CKMBINDEX, TROPONINI in the last 168 hours. ? ?BNP (last 3 results) ?No results for input(s): PROBNP in the last 8760 hours. ? ?Lipid Profile: ?No results for input(s): CHOL, HDL, LDLCALC, TRIG, CHOLHDL, LDLDIRECT in the last 72 hours. ? ?Thyroid Function Tests: ?No results for input(s): TSH, T4TOTAL, FREET4, T3FREE, THYROIDAB in the last 72 hours. ? ?Anemia Panel: ?No results for input(s): VITAMINB12, FOLATE, FERRITIN,  TIBC, IRON, RETICCTPCT in the last 72 hours. ? ?Urine analysis: ?   ?Component Value Date/Time  ? [place] 03/02/2019 1416  ? APPEARANCEUR CLEAR 03/02/2019 1416  ? LABSPEC 1.010 03/02/2019 1416  ? PHURINE 6.0 03/02/2019 1416  ? GLUCOSEU 50 (A) 03/02/2019 1416  ? HGBUR SMALL (A) 03/02/2019 1416  ? [place] NEGATIVE 03/02/2019 1416  ? KETONESUR 5 (A) 03/02/2019 1416  ? PROTEINUR 100 (A) 03/02/2019 1416  ? UROBILINOGEN 0.2 10/19/2015 1532  ? NITRITE NEGATIVE 03/02/2019 1416  ? LEUKOCYTESUR NEGATIVE 03/02/2019 1416  ? ? ?Sepsis Labs: ?Lactic Acid, Venous ?   ?Component Value Date/Time  ? LATICACIDVEN 1.7 07/16/2021 1510  ? ? ?MICROBIOLOGY: ?No results found for this or any previous visit (from the past 240 hour(s)). ? ?RADIOLOGY STUDIES/RESULTS: ?DG Chest 2 View ? ?Result Date: 05/01/2022 ?CLINICAL DATA:  Chest pain, nonradiating chest pain in a 66 year old female. EXAM: CHEST - 2 VIEW COMPARISON:  July 17, 2021. FINDINGS: LEFT-sided dialysis catheter terminates in the RIGHT atrium after air via LEFT IJ approach. Trachea is midline. Cardiomediastinal contours and hilar structures with mild cardiac enlargement and central pulmonary vascular engorgement similar to the prior study. No frank edema. No consolidation. No sign of pleural effusion. On limited assessment there is no acute skeletal process. IMPRESSION: Mild cardiac enlargement and central pulmonary vascular engorgement. Electronically Signed   By: Zetta Bills M.D.   On: 05/01/2022 08:31  ? ?IR Fluoro Guide CV Line Left ? ?Result Date: 05/02/2022 ?INDICATION: 66 year old female with end-stage renal disease on hemodialysis has a malfunctioning left IJ tunneled dialysis catheter EXAM: XA - EXTERNAL REPAIR CV CATH MEDICATIONS: None; The antibiotic was administered within an appropriate time interval prior to skin puncture. ANESTHESIA/SEDATION: Procedure was performed under local anesthesia FLUOROSCOPY: Radiation Exposure Index (as provided by the  fluoroscopic device): 49.7 mGy Kerma COMPLICATIONS: None immediate. PROCEDURE: Informed written consent was obtained from the patient after a thorough discussion of the procedural risks, benefits and alternatives. All q

## 2022-05-02 NOTE — CV Procedure (Signed)
Left Heart Catheterization 05/02/22:  ? ? ? ? ?Mild diffuse disease unchanged from angiogram a year ago. Medical therapy, elevated troponin probably due to small vessel disease. ASA for 4 weeks continue Eliquis for hypercoagulable state. F/U PCP and renal. ? ? ?[name], MD, [facility] ?05/02/2022, 5:36 PM ?Office: [phone] ?Fax: [phone] ?Pager: [phone]  ? ? ?

## 2022-05-02 NOTE — ED Notes (Addendum)
The AVS looked to me as completed, discharged patient. Cardiology called attending and had requested another trop. Pt had left building. Attending called patient to return. ?

## 2022-05-02 NOTE — ED Notes (Signed)
Iv attempts x2 unsuccessful.  

## 2022-05-02 NOTE — ED Notes (Signed)
Pt seen by Dr. Tamsen Snider, consent for heart cath. Cardiac team here, to cath lab via cart. ?

## 2022-05-03 ENCOUNTER — Telehealth: Payer: Self-pay

## 2022-05-03 ENCOUNTER — Encounter (HOSPITAL_COMMUNITY): Payer: Self-pay | Admitting: Cardiology

## 2022-05-03 DIAGNOSIS — Z992 Dependence on renal dialysis: Secondary | ICD-10-CM | POA: Diagnosis not present

## 2022-05-03 DIAGNOSIS — D689 Coagulation defect, unspecified: Secondary | ICD-10-CM | POA: Diagnosis not present

## 2022-05-03 DIAGNOSIS — N2581 Secondary hyperparathyroidism of renal origin: Secondary | ICD-10-CM | POA: Diagnosis not present

## 2022-05-03 DIAGNOSIS — I739 Peripheral vascular disease, unspecified: Secondary | ICD-10-CM

## 2022-05-03 DIAGNOSIS — D631 Anemia in chronic kidney disease: Secondary | ICD-10-CM | POA: Diagnosis not present

## 2022-05-03 DIAGNOSIS — N186 End stage renal disease: Secondary | ICD-10-CM | POA: Diagnosis not present

## 2022-05-03 DIAGNOSIS — I214 Non-ST elevation (NSTEMI) myocardial infarction: Secondary | ICD-10-CM

## 2022-05-03 DIAGNOSIS — R7881 Bacteremia: Secondary | ICD-10-CM | POA: Diagnosis not present

## 2022-05-03 LAB — CBC
HCT: 36.6 % (ref 36.0–46.0)
Hemoglobin: 11.4 g/dL — ABNORMAL LOW (ref 12.0–15.0)
MCH: 29.7 pg (ref 26.0–34.0)
MCHC: 31.1 g/dL (ref 30.0–36.0)
MCV: 95.3 fL (ref 80.0–100.0)
Platelets: 139 10*3/uL — ABNORMAL LOW (ref 150–400)
RBC: 3.84 MIL/uL — ABNORMAL LOW (ref 3.87–5.11)
RDW: 14.9 % (ref 11.5–15.5)
WBC: 9.1 10*3/uL (ref 4.0–10.5)
nRBC: 0 % (ref 0.0–0.2)

## 2022-05-03 LAB — RENAL FUNCTION PANEL
Albumin: 2.9 g/dL — ABNORMAL LOW (ref 3.5–5.0)
Anion gap: 13 (ref 5–15)
BUN: 41 mg/dL — ABNORMAL HIGH (ref 8–23)
CO2: 20 mmol/L — ABNORMAL LOW (ref 22–32)
Calcium: 7.9 mg/dL — ABNORMAL LOW (ref 8.9–10.3)
Chloride: 99 mmol/L (ref 98–111)
Creatinine, Ser: 10.62 mg/dL — ABNORMAL HIGH (ref 0.44–1.00)
GFR, Estimated: 4 mL/min — ABNORMAL LOW (ref >60)
Glucose, Bld: 219 mg/dL — ABNORMAL HIGH (ref 70–99)
Phosphorus: 5.8 mg/dL — ABNORMAL HIGH (ref 2.5–4.6)
Potassium: 5.7 mmol/L — ABNORMAL HIGH (ref 3.5–5.1)
Sodium: 132 mmol/L — ABNORMAL LOW (ref 135–145)

## 2022-05-03 MED ORDER — LIDOCAINE-PRILOCAINE 2.5-2.5 % EX CREA
1.0000 "application " | TOPICAL_CREAM | CUTANEOUS | Status: DC | PRN
  Filled 2022-05-03: qty 5

## 2022-05-03 MED ORDER — METOPROLOL SUCCINATE ER 25 MG PO TB24: 25.0000 mg | ORAL_TABLET | Freq: Every day | ORAL | 1 refills | Status: DC

## 2022-05-03 MED ORDER — PENTAFLUOROPROP-TETRAFLUOROETH EX AERO: 1.0000 "application " | INHALATION_SPRAY | CUTANEOUS | Status: DC | PRN

## 2022-05-03 MED ORDER — SODIUM CHLORIDE 0.9 % IV SOLN: 100.0000 mL | INTRAVENOUS | Status: DC | PRN

## 2022-05-03 MED ORDER — LIDOCAINE HCL (PF) 1 % IJ SOLN: 5.0000 mL | INTRAMUSCULAR | Status: DC | PRN

## 2022-05-03 MED ORDER — HEPARIN SODIUM (PORCINE) 1000 UNIT/ML DIALYSIS
5000.0000 [IU] | Freq: Once | INTRAMUSCULAR | Status: AC
  Administered 2022-05-03: 5000 [IU] via INTRAVENOUS_CENTRAL
  Filled 2022-05-03: qty 5

## 2022-05-03 MED ORDER — HEPARIN SODIUM (PORCINE) 1000 UNIT/ML DIALYSIS
1000.0000 [IU] | INTRAMUSCULAR | Status: DC | PRN
  Filled 2022-05-03 (×2): qty 1

## 2022-05-03 MED ORDER — ALTEPLASE 2 MG IJ SOLR
2.0000 mg | Freq: Once | INTRAMUSCULAR | Status: DC | PRN
  Filled 2022-05-03: qty 2

## 2022-05-03 MED ORDER — METOPROLOL SUCCINATE ER 25 MG PO TB24
25.0000 mg | ORAL_TABLET | Freq: Every day | ORAL | Status: DC
Start: 2022-05-03 — End: 2022-05-03
  Administered 2022-05-03: 25 mg via ORAL
  Filled 2022-05-03: qty 1

## 2022-05-03 MED ORDER — ASPIRIN 81 MG PO CHEW: 81.0000 mg | CHEWABLE_TABLET | Freq: Every day | ORAL | 0 refills | Status: DC

## 2022-05-03 NOTE — Discharge Summary (Signed)
? ?PATIENT DETAILS ?Name: Whitney Walker ?Age: 66 y.o. ?Sex: female ?Date of Birth: 1956/10/26 ?MRN: [id number]. ?Admitting Physician: Jonetta Osgood, MD ?[id number], Doreene Burke, [place] ? ?Admit Date: 05/01/2022 ?Discharge date: 05/03/2022 ? ?Recommendations for Outpatient Follow-up:  ?Follow up with PCP in 1-2 weeks ?Please obtain CMP/CBC in one week ?Please ensure follow-up with cardiology and nephrology. ? ?Admitted From:  ?Home ? ?Disposition: ?Home ?  ?Discharge Condition: ?good ? ?CODE STATUS: ?  Code Status: Full Code  ? ?Diet recommendation:  ?Diet Order   ? ?       ?  Diet - low sodium heart healthy       ?  ?  Diet renal/carb modified with fluid restriction Diet-HS Snack? Nothing; Fluid restriction: 1200 mL Fluid; Room service appropriate? Yes; Fluid consistency: Thin  Diet effective now       ?  ? ?  ?  ? ?  ?  ? ?Brief Summary: ?66 year old female with history of ESRD on HD MWF, VTE on Eliquis, chronic left hip pain-who presented from dialysis center-for malfunctioning dialysis catheter-and left-sided chest pain when coughing and on certain positions.  She was subsequently found to have hyperkalemia-IR exchanged her malfunctioning hemodialysis catheter-she underwent hemodialysis-and was subsequently admitted to the hospitalist service for further evaluation. ?  ?Significant events: ?5/3>> transfer from HD center to ED for HD catheter malfunction/left-sided chest wall pain-found to have hyperkalemia ?  ?Significant studies: ?5/3>> CXR: No PNA ?  ?Significant microbiology data: ?  ?  ?Procedures: ?5/3>> HD catheter exchanged by IR. ?5/4>> left heart catheterization by Dr. Einar Gip ?  ?Consults: ?Nephrology, IR, cardiology ? ?Brief Hospital Course: ?Hyperkalemia: Treated with HD-resolved. ? ?Malfunctioning HD catheter: Evaluated by IR-HD catheter exchanged-subsequently underwent successful dialysis. ?  ?Atypical chest pain: Per patient this occurred with only certain positions when her HD catheter was  malfunctioning and with coughing spells.  Chest pain has resolved-however troponins this morning were significantly elevated-cardiology to follow-see below. ?  ?Non-STEMI due to small vessel disease: Evaluated by Dr. Jobe Gibbon did not show any major stenosis.  Per cardiology-to continue aspirin for 30 days-continue beta-blocker.  Already on statin.  Please ensure outpatient follow-up with cardiology. ?  ?Chronic left-sided hip pain: Followed by Dr. Tera Helper is seeking a second opinion from [facility].  Continue Tylenol and other narcotics as previous. ?  ?ESRD: On HD MWF.  Nephrology following. ?  ?HTN: BP soft overnight-but has stabilized-continue to hold Coreg. ?  ?HLD: Continue statin ?  ?History of recurrent VTE: Continue Eliquis. ?  ?History of PAD s/p left femoral popliteal bypass in 2014 ?  ?OSA: Apparently not on CPAP at home. ?  ?Obesity: ?Estimated body mass index is 37.62 kg/m? as calculated from the following: ?  Height as of this encounter: '5\' 2"'$  (1.575 m). ?  Weight as of this encounter: 93.3 kg. ? ? ?Discharge Diagnoses:  ?Principal Problem: ?  Chest wall pain ?Active Problems: ?  PAD (peripheral artery disease) ([place]) ?  ESRD (end stage renal disease) ([place]) ?  History of pulmonary embolism ?  Chest pain ?  Non-ST elevation (NSTEMI) myocardial infarction [facility]) ? ? ?Discharge Instructions: ? ?Activity:  ?As tolerated with Full fall precautions use walker/cane & assistance as needed ? ? ?Discharge Instructions   ? ? Ambulatory referral to Cardiology   Complete by: As directed ?  ? Diet - low sodium heart healthy   Complete by: As directed ?  ? Discharge instructions   Complete by: As directed ?  ?  Follow with Primary MD  [name], FNP in 1-2 weeks ? ?Please get a complete blood count and chemistry panel checked by your Primary MD at your next visit, and again as instructed by your Primary MD. ? ?Get Medicines reviewed and adjusted: ?Please take all your medications with you for your next visit  with your Primary MD ? ?Laboratory/radiological data: ?Please request your Primary MD to go over all hospital tests and procedure/radiological results at the follow up, please ask your Primary MD to get all Hospital records sent to his/her office. ? ?In some cases, they will be blood work, cultures and biopsy results pending at the time of your discharge. Please request that your primary care M.D. follows up on these results. ? ?Also Note the following: ?If you experience worsening of your admission symptoms, develop shortness of breath, life threatening emergency, suicidal or homicidal thoughts you must seek medical attention immediately by calling 911 or calling your MD immediately  if symptoms less severe. ? ?You must read complete instructions/literature along with all the possible adverse reactions/side effects for all the Medicines you take and that have been prescribed to you. Take any new Medicines after you have completely understood and accpet all the possible adverse reactions/side effects.  ? ?Do not drive when taking Pain medications or sleeping medications (Benzodaizepines) ? ?Do not take more than prescribed Pain, Sleep and Anxiety Medications. It is not advisable to combine anxiety,sleep and pain medications without talking with your primary care practitioner ? ?Special Instructions: If you have smoked or chewed Tobacco  in the last 2 yrs please stop smoking, stop any regular Alcohol  and or any Recreational drug use. ? ?Wear Seat belts while driving. ? ?Please note: ?You were cared for by a hospitalist during your hospital stay. Once you are discharged, your primary care physician will handle any further medical issues. Please note that NO REFILLS for any discharge medications will be authorized once you are discharged, as it is imperative that you return to your primary care physician (or establish a relationship with a primary care physician if you do not have one) for your post hospital discharge  needs so that they can reassess your need for medications and monitor your lab values.  ? Increase activity slowly   Complete by: As directed ?  ? ?  ? ?Allergies as of 05/03/2022   ? ?   Reactions  ? Carnosine   ? Other reaction(s): Unknown  ? Gadolinium Derivatives Hives, Other (See Comments)  ? HIVES, Desc: HIVES W/ "DYE" USED FOR 1ST CT SCAN BUT NOT 2ND, NO PREMEDS USED, PT UNCERTAIN OF CIRCUMSTANCES,,?POSSIBLE MRI CONTRAST ALLERGY, ALL STUDIES DONE "SOMEWHERE" IN [place]//A.C., Onset Date: [id number]  ? Iohexol Hives, Other (See Comments)  ? Desc: HIVES W/ "DYE" USED FOR 1ST CT SCAN BUT NOT 2ND, NO PREMEDS USED, PT UNCERTAIN OF CIRCUMSTANCES,,?POSSIBLE MRI CONTRAST ALLERGY, ALL STUDIES DONE "SOMEWHERE" IN [place]//A.C., Onset Date: [id number]  ? Iodinated Contrast Media Hives  ? Iodine Hives  ? Naltrexone Other (See Comments)  ? Unknown  ? ?  ? ?  ?Medication List  ?  ? ?STOP taking these medications   ? ?carvedilol 12.5 MG tablet ?Commonly known as: COREG ?  ? ?  ? ?TAKE these medications   ? ?acetaminophen 650 MG CR tablet ?Commonly known as: TYLENOL ?Take 1,950-2,600 mg by mouth 2 (two) times daily as needed for pain. Do not exceed more than '4000mg'$  (or 6 capsules) in 24hrs. ?  ?albuterol 108 (90  Base) MCG/ACT inhaler ?Commonly known as: VENTOLIN HFA ?Inhale 2 puffs into the lungs every 6 (six) hours as needed for wheezing or shortness of breath. ?  ?aspirin 81 MG chewable tablet ?Chew 1 tablet (81 mg total) by mouth daily. ?  ?atorvastatin 10 MG tablet ?Commonly known as: LIPITOR ?Take 1 tablet (10 mg total) by mouth daily. ?What changed: when to take this ?  ?Auryxia 1 GM 210 MG(Fe) tablet ?Generic drug: ferric citrate ?Take 420-840 mg by mouth See admin instructions. Take '840mg'$  by mouth three times daily with meals and '420mg'$  with snacks. ?  ?Biotin 10000 MCG Tabs ?Take 10,000 mcg by mouth daily. ?  ?cyclobenzaprine 10 MG tablet ?Commonly known as: FLEXERIL ?Take 1 tablet (10 mg total) by mouth 3  (three) times daily as needed for muscle spasms. ?  ?Eliquis 2.5 MG Tabs tablet ?Generic drug: apixaban ?TAKE ONE TABLET BY MOUTH TWICE DAILY ?What changed: how much to take ?  ?meclizine 12.5 MG tablet ?Commonly known as

## 2022-05-03 NOTE — Telephone Encounter (Signed)
Transition Care Management Unsuccessful Follow-up Telephone Call ? ?Date of discharge and from where:  05/01/2022 ? ?Attempts:  1st Attempt ? ?Reason for unsuccessful TCM follow-up call:  Left voice message ? ?  ?

## 2022-05-03 NOTE — Progress Notes (Signed)
TR BAND REMOVAL ? ?LOCATION:    right radial ? ?DEFLATED PER PROTOCOL:    Yes.   ? ?TIME BAND OFF / DRESSING APPLIED:    2035  ? ?SITE UPON ARRIVAL:    Level 0 ? ?SITE AFTER BAND REMOVAL:    Level 0 ? ?CIRCULATION SENSATION AND MOVEMENT:    Within Normal Limits   Yes.   ? ?COMMENTS:   Applied sterile dressing, good capillary refill, instructed post cath site care and verbalized understanding. ?

## 2022-05-03 NOTE — Progress Notes (Signed)
D/C order noted. Contacted [facility] to advise clinic of pt's d/c today and that pt will resume care on Monday.  ? ?Melven Sartorius ?Renal Navigator ?[phone] ?

## 2022-05-03 NOTE — Progress Notes (Addendum)
?[facility] ?Progress Note  ? ?Subjective:    ?Seen and examined patient on HD. Patient c/o mild SOB and continuous cough. Not in respiratory distress. UFG only 1L. Attempted to raised to 2L but patient became very upset and refused. She states: "I know my body and do not want my dry weight adjusted". Respiratory therapist notified to give PRN breathing treatment. UFG back to 1L. ? ?Objective ?Vitals:  ? 05/03/22 0730 05/03/22 0800 05/03/22 0830 05/03/22 0900  ?BP: (!) 158/78 (!) 159/67 (!) 165/81 (!) 183/85  ?Pulse: 98 (!) 101 96 (!) 101  ?Resp: '18 10 10 13  '$ ?Temp:      ?TempSrc:      ?SpO2:      ?Weight:      ?Height:      ? ?Physical Exam ?General: Alert, nad, on [place] ?Heart: RRR no m,r,g  ?Lungs: Noted rhonchi RLL ?Abdomen: soft non-tender  ?Extremities: no LE edema  ?Dialysis Access: L IJ TDC  ? ?Filed Weights  ? 05/01/22 1612 05/03/22 0521 05/03/22 0651  ?Weight: 93.3 kg 94.1 kg 95.2 kg  ? ? ?Intake/Output Summary (Last 24 hours) at 05/03/2022 0918 ?Last data filed at 05/02/2022 2315 ?Gross per 24 hour  ?Intake 240 ml  ?Output --  ?Net 240 ml  ? ? ?Additional Objective ?Labs: ?Basic Metabolic Panel: ?Recent Labs  ?Lab 05/01/22 ?7494 05/01/22 ?1357 05/01/22 ?2219 05/03/22 ?0118  ?NA 133* 132* 136 132*  ?K 7.2* 7.2* 3.8 5.7*  ?CL 101 103 101 99  ?CO2 17* 13*  --  20*  ?GLUCOSE 95 60* 60* 219*  ?BUN 71* 74* 27* 41*  ?CREATININE 15.23* 15.55* 7.90* 10.62*  ?CALCIUM 8.4* 7.9*  --  7.9*  ?PHOS  --   --   --  5.8*  ? ?Liver Function Tests: ?Recent Labs  ?Lab 05/03/22 ?0118  ?ALBUMIN 2.9*  ? ?No results for input(s): LIPASE, AMYLASE in the last 168 hours. ?CBC: ?Recent Labs  ?Lab 05/01/22 ?4967 05/01/22 ?2219 05/03/22 ?0118  ?WBC 12.7*  --  9.1  ?HGB 13.1 12.6 11.4*  ?HCT 42.1 37.0 36.6  ?MCV 95.9  --  95.3  ?PLT 167  --  139*  ? ?Blood Culture ?   ?Component Value Date/Time  ? SDES BLOOD RIGHT UPPER ARM 07/19/2021 1648  ? SPECREQUEST  07/19/2021 1648  ?  BOTTLES DRAWN AEROBIC ONLY Blood Culture results may  not be optimal due to an inadequate volume of blood received in culture bottles  ? CULT  07/19/2021 1648  ?  NO GROWTH 5 DAYS ?Performed at [facility], [place] [address], [place], [place] [address] ?  ? REPTSTATUS 07/24/2021 FINAL 07/19/2021 1648  ? ? ?Cardiac Enzymes: ?No results for input(s): CKTOTAL, CKMB, CKMBINDEX, TROPONINI in the last 168 hours. ?CBG: ?Recent Labs  ?Lab 05/01/22 ?1230 05/02/22 ?8466  ?GLUCAP 164* 82  ? ?Iron Studies: No results for input(s): IRON, TIBC, TRANSFERRIN, FERRITIN in the last 72 hours. ?Lab Results  ?Component Value Date  ? INR 1.3 (H) 07/16/2021  ? INR 1.2 05/28/2021  ? INR 0.98 07/16/2018  ? ?Studies/Results: ?CARDIAC CATHETERIZATION ? ?Addendum Date: 05/02/2022   ?Left Heart Catheterization 05/02/22: RCA: Dominant.  Mild diffuse disease. Left main: Large vessel, very short and immediately bifurcates.  Calcified. LCx: Large vessel, Ugalde to a very high large OM1, a moderate-sized OM 2 distally, mild to moderate diffuse disease is present.  No high-grade stenosis. LAD: Large vessel, gives coronary to a very large D1, mild diffuse disease  again evident without any focal high-grade stenosis.  Diffuse coronary calcification is again evident.  LAD is tortuous in the mid to distal segment suggestive of hypertensive heart disease. Recommendation: [name] positive cardiac markers probably related to very small vessel disease.  Do not see any obvious dissection in the major coronary vessels or thrombus formation or occlusion.  No significant change in coronary anatomy since prior angiography a year ago in 2022.  Recommend continuing medical therapy.  30 mL contrast utilized. ? ?Result Date: 05/02/2022 ?Left Heart Catheterization 05/02/22: RCA: Dominant.  Mild diffuse disease. Left main: Large vessel, very short and immediately bifurcates.  Calcified. LCx: Large vessel, Ugalde to a very high large OM1, a moderate-sized OM 2 distally, mild to moderate diffuse disease is present.  No  high-grade stenosis. LAD: Large vessel, gives coronary to a very large D1, mild diffuse disease again evident without any focal high-grade stenosis.  Diffuse coronary calcification is again evident.  LAD is tortuous in the mid to distal segment suggestive of hypertensive heart disease. Recommendation: [name] positive cardiac markers probably related to very small vessel disease.  Do not see any obvious dissection in the major coronary vessels or thrombus formation or occlusion.  No significant change in coronary anatomy since prior angiography a year ago in 2022.  Recommend continuing medical therapy.  30 mL contrast utilized.  ? ?IR Fluoro Guide CV Line Left ? ?Result Date: 05/02/2022 ?INDICATION: 66 year old female with end-stage renal disease on hemodialysis has a malfunctioning left IJ tunneled dialysis catheter EXAM: XA - EXTERNAL REPAIR CV CATH MEDICATIONS: None; The antibiotic was administered within an appropriate time interval prior to skin puncture. ANESTHESIA/SEDATION: Procedure was performed under local anesthesia FLUOROSCOPY: Radiation Exposure Index (as provided by the fluoroscopic device): 89.3 mGy Kerma COMPLICATIONS: None immediate. PROCEDURE: Informed written consent was obtained from the patient after a thorough discussion of the procedural risks, benefits and alternatives. All questions were addressed. Maximal Sterile Barrier Technique was utilized including caps, mask, sterile gowns, sterile gloves, sterile drape, hand hygiene and skin antiseptic. A timeout was performed prior to the initiation of the procedure. Existing left IJ tunneled catheter site was prepped and draped in sterile fashion. 1% xylocaine was administered for local anesthesia. Then, with blunt and sharp dissection, the cuff of the catheter was released from the surrounding tissue, 035 inch stiff Glidewire was inserted, the catheter was removed and a new 14.5 [place] by 23 cm palindrome catheter was advanced over the Glidewire,  to place the tip of the catheter at the caval atrial junction. Both the ports of the catheter aspirated and flushed briskly. The ports were flushed with saline and heparin, the catheter was anchored to the skin using 0 Prolene and a sterile dressing including a Biopatch was placed. Fluoroscopic spot images obtained. IMPRESSION: Fluoroscopic guided left transjugular tunneled dialysis catheter exchange. Electronically Signed   By: Frazier Richards M.D.   On: 05/02/2022 08:48  ? ?DG Chest Port 1 View ? ?Result Date: 05/02/2022 ?CLINICAL DATA:  Chest pain. EXAM: PORTABLE CHEST 1 VIEW COMPARISON:  Chest x-ray 07/17/2021. FINDINGS: Left-sided central venous catheter tip projects over the distal SVC. The heart is mildly enlarged, unchanged. There is minimal left basilar atelectasis. There is no pleural effusion or pneumothorax. No acute fractures are seen. The heart size and mediastinal contours are within normal limits. Both lungs are clear. The visualized skeletal structures are unremarkable. IMPRESSION: No active disease.  Stable cardiomegaly. Electronically Signed   By: Ronney Asters M.D.   On: 05/02/2022  00:06   ? ?Medications: ? [START ON 05/04/2022] sodium chloride    ? [START ON 05/04/2022] sodium chloride    ? sodium chloride    ? ? acetaminophen  1,000 mg Oral Q8H  ? apixaban  2.5 mg Oral BID  ? aspirin  81 mg Oral Daily  ? atorvastatin  10 mg Oral Daily  ? Chlorhexidine Gluconate Cloth  6 each Topical Q0600  ? doxercalciferol  5 mcg Intravenous Q M,W,F-HD  ? ferric citrate  420 mg Oral TID WC  ? [START ON 05/04/2022] heparin  5,000 Units Dialysis Once in dialysis  ? pantoprazole  40 mg Oral Daily  ? sodium chloride flush  3 mL Intravenous Q12H  ? sodium chloride flush  3 mL Intravenous Q12H  ? ? ?Dialysis Orders: ?[facility] ? 3.5h  92.5kg   400/1.5   2/2.5 bath  TDC LIJ  Heparin 6000 ? - last Hb 11.7 on 4/26 ? - no esa or Fe ? - hectorol 5 ug IV tiw ? ?Assessment/Plan: ?Hyperkalemia - Noted K+ trending up-now 5.7, currently  on HD.  ?Malfunctioning HD access - positional access problems at dialysis center. Currently functioning well.  ?Chest pain - had LHC in 2022 w/ no sig CAD (max lesions were 30%). Trops trending up her

## 2022-05-03 NOTE — Progress Notes (Signed)
Subjective:  ?No chest pain. ? ?Intake/Output from previous day: ? ?I/O last 3 completed shifts: ?In: 791.6 [P.O.:240; IV Piggyback:551.6] ?Out: -1000  ?No intake/output data recorded. ?Net IO Since Admission: 1,791.62 mL [05/03/22 0956]  ?Blood pressure (!) 159/68, pulse (!) 109, temperature 98 ?F (36.7 ?C), resp. rate 13, height _0  (1.575 m), weight 95.2 kg, SpO2 97 %. ?Physical Exam ?Vitals reviewed.  ?Constitutional:   ?   Appearance: She is obese.  ?Cardiovascular:  ?   Rate and Rhythm: Normal rate and regular rhythm.  ?   Pulses: Intact distal pulses.  ?   Heart sounds: S1 normal and S2 normal. Murmur heard.  ?Harsh midsystolic murmur is present at the upper right sternal border.  ?  No friction rub. No gallop.  ?   Comments: Tunneled dialysis catheter in place in the left infra clavicle region.  Left radial arterial access site without any complications. ?Pulmonary:  ?   Effort: Pulmonary effort is normal. No respiratory distress.  ?   Breath sounds: No wheezing, rhonchi or rales.  ?Musculoskeletal:  ?   Right lower leg: No edema.  ?   Left lower leg: No edema.  ? ? ?Lab Results: ?BMP ?BNP (last 3 results) ?No results for input(s): BNP in the last 8760 hours. ? ?ProBNP (last 3 results) ?No results for input(s): PROBNP in the last 8760 hours. ? ?  Latest Ref Rng & Units 05/03/2022  ?  1:18 AM 05/01/2022  ? 10:19 PM 05/01/2022  ?  1:57 PM  ?BMP  ?Glucose 70 - 99 mg/dL 219   60   60    ?BUN 8 - 23 mg/dL 41   27   74    ?Creatinine 0.44 - 1.00 mg/dL 10.62   7.90   15.55    ?Sodium 135 - 145 mmol/L 132   136   132    ?Potassium 3.5 - 5.1 mmol/L 5.7   3.8   7.2    ?Chloride 98 - 111 mmol/L 99   101   103    ?CO2 22 - 32 mmol/L 20    13    ?Calcium 8.9 - 10.3 mg/dL 7.9    7.9    ? ? ?  Latest Ref Rng & Units 05/03/2022  ?  1:18 AM 11/15/2021  ? 11:45 AM 07/18/2021  ?  2:30 PM  ?Hepatic Function  ?Total Protein 6.0 - 8.5 g/dL  7.4     ?Albumin 3.5 - 5.0 g/dL 2.9   3.9   2.0    ?AST 0 - 40 IU/L  30     ?ALT 0 - 32 IU/L  29      ?Alk Phosphatase 44 - 121 IU/L  196     ?Total Bilirubin 0.0 - 1.2 mg/dL  0.3     ? ? ?  Latest Ref Rng & Units 05/03/2022  ?  1:18 AM 05/01/2022  ? 10:19 PM 05/01/2022  ?  8:10 AM  ?CBC  ?WBC 4.0 - 10.5 K/uL 9.1    12.7    ?Hemoglobin 12.0 - 15.0 g/dL 11.4   12.6   13.1    ?Hematocrit 36.0 - 46.0 % 36.6   37.0   42.1    ?Platelets 150 - 400 K/uL 139    167    ? ?Lipid Panel  ?   ?Component Value Date/Time  ? CHOL 183 03/15/2021 1143  ? TRIG 159 (H) 03/15/2021 1143  ? HDL 55 03/15/2021 1143  ?  CHOLHDL 3.3 03/15/2021 1143  ? CHOLHDL 5.6 09/08/2009 0403  ? VLDL 31 09/08/2009 0403  ? [place] 100 (H) 03/15/2021 1143  ? ?Cardiac Panel (last 3 results) ?No results for input(s): CKTOTAL, CKMB, TROPONINI, RELINDX in the last 72 hours. ? ?HEMOGLOBIN A1C ?Lab Results  ?Component Value Date  ? HGBA1C 5.9 (H) 11/15/2021  ? MPG 131.24 07/16/2021  ? ?TSH ?No results for input(s): TSH in the last 8760 hours. ?Imaging: ?Imaging results have been reviewed ? ?Cardiac Studies: ? ?EKG: 05/01/2022: Sinus rhythm at a rate of 81 bpm.  Left axis, left anterior fascicular block.  Left atrial enlargement.  Incomplete right bundle branch block.  T wave inversions unchanged  ? ? ?Echocardiogram 07/17/2021:. ?1. Left ventricular ejection fraction, by estimation, is 70 to 75%. The left ventricle has hyperdynamic function. The left ventricle has no regional wall motion abnormalities. Left ventricular diastolic parameters are consistent with Grade I diastolic  ?dysfunction (impaired relaxation). Elevated left atrial pressure. ? 2. Right ventricular systolic function is normal. The right ventricular size is normal. The estimated right ventricular systolic pressure is 41.6 mmHg. ? 3. Left atrial size was mildly dilated. ? 4. The mitral valve is normal in structure. No evidence of mitral valve regurgitation. Moderate mitral annular calcification. ? 5. The aortic valve is tricuspid. There is mild calcification of the aortic valve. There is mild thickening  of the aortic valve. Aortic valve regurgitation is not visualized. Mild to moderate aortic valve sclerosis/calcification is present, without any  ?evidence of aortic stenosis. Aortic valve mean gradient measures 14.0 mmHg. Aortic valve Vmax measures 2.52 m/s. ? 6. The inferior vena cava is normal in size with greater than 50% respiratory variability, suggesting right atrial pressure of 3 mmHg. ? ?Comparison(s): Prior images unable to be directly viewed, comparison made by report only ? ?Left Heart Catheterization 05/02/22:  ?RCA: Dominant.  Mild diffuse disease. ?Left main: Large vessel, very short and immediately bifurcates.  Calcified. ?LCx: Large vessel, Ugalde to a very high large OM1, a moderate-sized OM 2 distally, mild to moderate diffuse disease is present.  No high-grade stenosis. ?LAD: Large vessel, gives coronary to a very large D1, mild diffuse disease again evident without any focal high-grade stenosis.  Diffuse coronary calcification is again evident.  LAD is tortuous in the mid to distal segment suggestive of hypertensive heart disease. ? ?Recommendation: [name] positive cardiac markers probably related to very small vessel disease.  Do not see any obvious dissection in the major coronary vessels or thrombus formation or occlusion.  No significant change in coronary anatomy since prior angiography a year ago in 2022.  Recommend continuing medical therapy.  30 mL contrast utilized. ? ? ?Scheduled Meds: ? acetaminophen  1,000 mg Oral Q8H  ? apixaban  2.5 mg Oral BID  ? aspirin  81 mg Oral Daily  ? atorvastatin  10 mg Oral Daily  ? Chlorhexidine Gluconate Cloth  6 each Topical Q0600  ? doxercalciferol  5 mcg Intravenous Q M,W,F-HD  ? ferric citrate  420 mg Oral TID WC  ? pantoprazole  40 mg Oral Daily  ? sodium chloride flush  3 mL Intravenous Q12H  ? sodium chloride flush  3 mL Intravenous Q12H  ? ?Continuous Infusions: ? [START ON 05/04/2022] sodium chloride    ? [START ON 05/04/2022] sodium chloride     ? sodium chloride    ? ?PRN Meds:.[START ON 05/04/2022] sodium chloride, [START ON 05/04/2022] sodium chloride, sodium chloride, acetaminophen, albuterol, ALPRAZolam, [START ON 05/04/2022] alteplase,  fentaNYL (SUBLIMAZE) injection, [START ON 05/04/2022] heparin, [START ON 05/04/2022] lidocaine (PF), [START ON 05/04/2022] lidocaine-prilocaine, ondansetron (ZOFRAN) IV, oxyCODONE-acetaminophen, [START ON 05/04/2022] pentafluoroprop-tetrafluoroeth, sodium chloride flush ? ?Assessment  ? ?1.  NSTEMI ?2.  Moderate coronary artery disease but no high-grade stenosis. ? ?Plan:  ? ?From cardiac standpoint, no change in coronary anatomy from 2021.  She can be discharged home, NSTEMI related to small vessel disease.  Continue statins.  Aspirin for 30 days followed by continued Eliquis indefinitely for hypercoagulable state.  LV function is normal, she has not had any recurrence of angina, however in view of recent ACS, will also add metoprolol succinate 25 mg daily.  No ACE inhibitor's due to hyperkalemia and renal failure.  Follow-up with PCP. ? ? ? ?[name], MD, [facility] ?05/03/2022, 9:56 AM ?Office: [phone] ?Fax: [phone] ?Pager: [phone]   ?

## 2022-05-03 NOTE — Progress Notes (Addendum)
removed 41ms net fluid. pt refused increase in goal, pt constantly coughing, asking for breathing tx, 2 to 3 liters above EDW> of 92.5 kg  pt very upset when trying to increase goal under physician orders. bp elevated.  pre bp 164/52 post bp 142/69  pre weight 95.2kg edw 92kg post weight 94.0kg.  bed scales.  catheter touchy ran at 350 most of treatment dressing changed oto left chest.  gave hectorol and heparin as ordered. packed cath with heparin clamped and capped. ? ?Turn uf off last 22 minutes of tx due to pt complaining of chest pressure,  HR irregular at that time ranging from 70s to 120s.  Nephrology aware.   ?

## 2022-05-04 LAB — LIPOPROTEIN A (LPA): Lipoprotein (a): 67 nmol/L — ABNORMAL HIGH (ref <75.0)

## 2022-05-06 ENCOUNTER — Telehealth: Payer: Self-pay

## 2022-05-06 DIAGNOSIS — N2581 Secondary hyperparathyroidism of renal origin: Secondary | ICD-10-CM | POA: Diagnosis not present

## 2022-05-06 DIAGNOSIS — D689 Coagulation defect, unspecified: Secondary | ICD-10-CM | POA: Diagnosis not present

## 2022-05-06 DIAGNOSIS — Z992 Dependence on renal dialysis: Secondary | ICD-10-CM | POA: Diagnosis not present

## 2022-05-06 DIAGNOSIS — D631 Anemia in chronic kidney disease: Secondary | ICD-10-CM | POA: Diagnosis not present

## 2022-05-06 DIAGNOSIS — R7881 Bacteremia: Secondary | ICD-10-CM | POA: Diagnosis not present

## 2022-05-06 DIAGNOSIS — M25552 Pain in left hip: Secondary | ICD-10-CM | POA: Diagnosis not present

## 2022-05-06 DIAGNOSIS — N186 End stage renal disease: Secondary | ICD-10-CM | POA: Diagnosis not present

## 2022-05-06 NOTE — Telephone Encounter (Signed)
Spoke with pt briefly to complete tcm call, pt happened to be out when called. She said she will give the office a call back when she gets home.  ?

## 2022-05-08 ENCOUNTER — Telehealth: Payer: Self-pay

## 2022-05-08 DIAGNOSIS — D631 Anemia in chronic kidney disease: Secondary | ICD-10-CM | POA: Diagnosis not present

## 2022-05-08 DIAGNOSIS — N2581 Secondary hyperparathyroidism of renal origin: Secondary | ICD-10-CM | POA: Diagnosis not present

## 2022-05-08 DIAGNOSIS — D689 Coagulation defect, unspecified: Secondary | ICD-10-CM | POA: Diagnosis not present

## 2022-05-08 DIAGNOSIS — R7881 Bacteremia: Secondary | ICD-10-CM | POA: Diagnosis not present

## 2022-05-08 DIAGNOSIS — Z992 Dependence on renal dialysis: Secondary | ICD-10-CM | POA: Diagnosis not present

## 2022-05-08 DIAGNOSIS — N186 End stage renal disease: Secondary | ICD-10-CM | POA: Diagnosis not present

## 2022-05-08 NOTE — Telephone Encounter (Signed)
Transition Care Management Follow-up Telephone Call ?Date of discharge and from where: 05/03/2022 [place] [facility] ?How have you been since you were released from the hospital? Pt states she feels okay.  ?Any questions or concerns? No ? ?Items Reviewed: ?Did the pt receive and understand the discharge instructions provided? Yes  ?Medications obtained and verified? Yes  ?Other? Yes  ?Any new allergies since your discharge? No  ?Dietary orders reviewed? Yes ?Do you have support at home? Yes  ? ?Home Care and Equipment/Supplies: ?Were home health services ordered? no ?If so, what is the name of the agency? N/a  ?Has the agency set up a time to come to the patient's home? no ?Were any new equipment or medical supplies ordered?  No ?What is the name of the medical supply agency? N/a ?Were you able to get the supplies/equipment? no ?Do you have any questions related to the use of the equipment or supplies? No ? ?Functional Questionnaire: (I = Independent and D = Dependent) ?ADLs: i ? ?Bathing/Dressing- i ? ?Meal Prep- i ? ?Eating- i ? ?Maintaining continence- i ? ?Transferring/Ambulation- i ? ?Managing Meds- i ? ?Follow up appointments reviewed: ? ?PCP Hospital f/u appt confirmed? Yes  Scheduled to see [name] on 05/23/2022 @ [facility]. ?[facility] f/u appt confirmed? No  Scheduled to see n/a on n/a @ n/a. ?Are transportation arrangements needed? No  ?If their condition worsens, is the pt aware to call PCP or go to the Emergency Dept.? Yes ?Was the patient provided with contact information for the PCP's office or ED? Yes ?Was to pt encouraged to call back with questions or concerns? Yes  ?

## 2022-05-10 DIAGNOSIS — D631 Anemia in chronic kidney disease: Secondary | ICD-10-CM | POA: Diagnosis not present

## 2022-05-10 DIAGNOSIS — N2581 Secondary hyperparathyroidism of renal origin: Secondary | ICD-10-CM | POA: Diagnosis not present

## 2022-05-10 DIAGNOSIS — D689 Coagulation defect, unspecified: Secondary | ICD-10-CM | POA: Diagnosis not present

## 2022-05-10 DIAGNOSIS — N186 End stage renal disease: Secondary | ICD-10-CM | POA: Diagnosis not present

## 2022-05-10 DIAGNOSIS — Z992 Dependence on renal dialysis: Secondary | ICD-10-CM | POA: Diagnosis not present

## 2022-05-10 DIAGNOSIS — R7881 Bacteremia: Secondary | ICD-10-CM | POA: Diagnosis not present

## 2022-05-11 ENCOUNTER — Other Ambulatory Visit: Payer: Self-pay | Admitting: Student

## 2022-05-11 DIAGNOSIS — I6523 Occlusion and stenosis of bilateral carotid arteries: Secondary | ICD-10-CM

## 2022-05-11 DIAGNOSIS — I700 Atherosclerosis of aorta: Secondary | ICD-10-CM

## 2022-05-13 DIAGNOSIS — N2581 Secondary hyperparathyroidism of renal origin: Secondary | ICD-10-CM | POA: Diagnosis not present

## 2022-05-13 DIAGNOSIS — R7881 Bacteremia: Secondary | ICD-10-CM | POA: Diagnosis not present

## 2022-05-13 DIAGNOSIS — N186 End stage renal disease: Secondary | ICD-10-CM | POA: Diagnosis not present

## 2022-05-13 DIAGNOSIS — D689 Coagulation defect, unspecified: Secondary | ICD-10-CM | POA: Diagnosis not present

## 2022-05-13 DIAGNOSIS — D631 Anemia in chronic kidney disease: Secondary | ICD-10-CM | POA: Diagnosis not present

## 2022-05-13 DIAGNOSIS — Z992 Dependence on renal dialysis: Secondary | ICD-10-CM | POA: Diagnosis not present

## 2022-05-14 ENCOUNTER — Ambulatory Visit: Payer: Medicare Other | Admitting: Student

## 2022-05-14 ENCOUNTER — Encounter: Payer: Self-pay | Admitting: Student

## 2022-05-14 VITALS — BP 118/65 | HR 97 | Temp 98.2°F | Resp 17 | Ht 62.0 in | Wt 205.4 lb

## 2022-05-14 DIAGNOSIS — I6523 Occlusion and stenosis of bilateral carotid arteries: Secondary | ICD-10-CM | POA: Diagnosis not present

## 2022-05-14 DIAGNOSIS — I5032 Chronic diastolic (congestive) heart failure: Secondary | ICD-10-CM

## 2022-05-14 DIAGNOSIS — I2510 Atherosclerotic heart disease of native coronary artery without angina pectoris: Secondary | ICD-10-CM

## 2022-05-14 NOTE — Patient Instructions (Signed)
Stop aspirin 81 mg daily on June 6th, 2023. Continue Eliquis.  ?

## 2022-05-14 NOTE — Progress Notes (Signed)
? ?Primary Physician/Referring:  [name], FNP ? ?Patient ID: Whitney Walker, female    DOB: 1956-09-25, 66 y.o.   MRN: [id number] ? ?Chief Complaint  ?Patient presents with  ? Follow-up  ? Vascular Access Problem  ? ?HPI:   ? ?Whitney Walker  is a 66 y.o. African-American female with history of obesity, hypertension, hyperlipidemia, tobacco use, stage IV kidney disease on dialysis, obstructive sleep apnea not on CPAP, COPD and severe peripheral arterial disease with history of left femoral-popliteal bypass surgery by Dr. Victorino Dike in 2014.  Patient is on chronic anticoagulation with Eliquis due to recurrent DVT and PE in 2014 and 2016.  Patient is followed by hematology for hypercoagulable state.  She is currently undergoing hemodialysis on a Monday/Wednesday/Friday schedule. ? ?Patient was hospitalized 05/02/2022 - 05/03/2022 with atypical chest pain, coming from dialysis center for malfunctioning dialysis catheter.  Dialysis catheter was exchanged and she subsequently underwent successful dialysis.  However given atypical chest pain and elevated troponin she underwent left heart catheterization 05/02/2022 which revealed no obstructive CAD, but rather small vessel disease.  Patient was subsequently discharged on statin therapy, metoprolol succinate 25 mg daily, aspirin for 30 days followed by Eliquis, no ACE inhibitors due to hyperkalemia and renal failure. ? ?Patient now presents for hospital follow-up.  [name] primary concern today is worsening left hip pain for which she follows closely with [facility].  She is presently tolerating aspirin and Eliquis without bleeding diathesis.  She has had no recurrence of chest pain since admission.  She is aware to discontinue aspirin 81 mg on 06/04/2022 and continue Eliquis alone. ? ?Past Medical History:  ?Diagnosis Date  ? Anemia   ? Anxiety   ? CHF (congestive heart failure) ([place])   ? Colon cancer ([facility])   ? treatment surgery  ? Complication of anesthesia   ?  after first [facility] [place] "couldnt walk after", patient denies having a spinal  ? COPD (chronic obstructive pulmonary disease) ([place])   ? Coronary artery disease   ? Depression   ? DVT (deep venous thrombosis) ([place])   ? ESRD on hemodialysis ([place])   ? Hemo: MWF  ? History of blood transfusion 04/2018  ? Hypertension   ? 07/07/18- no longer takes BP medications  ? Meningitis   ? Pain in limb 07/30/2013  ? PE (pulmonary embolism)   ? Peripheral vascular disease ([place])   ? Restless legs   ? Shortness of breath   ? with exertion  ? Sleep apnea   ? SOB (shortness of breath) 03/03/2019  ? Vertigo   ? ?Past Surgical History:  ?Procedure Laterality Date  ? ABDOMINAL AORTAGRAM N/A 07/26/2013  ? Procedure: ABDOMINAL AORTAGRAM;  Surgeon: [name] [place], MD;  Location: [facility] CATH LAB;  Service: Cardiovascular;  Laterality: N/A;  ? ABDOMINAL HYSTERECTOMY    ? AV FISTULA PLACEMENT Left 05/05/2018  ? Procedure: ARTERIOVENOUS (AV) FISTULA CREATION BRACHIOCEPHALIC;  Surgeon: Waynetta Sandy, MD;  Location: [place];  Service: Vascular;  Laterality: Left;  ? AV FISTULA PLACEMENT Left 07/16/2018  ? Procedure: ARTERIOVENOUS FISTULA CREATION;  Surgeon: Waynetta Sandy, MD;  Location: [place];  Service: Vascular;  Laterality: Left;  ? BASCILIC VEIN TRANSPOSITION Left 09/03/2018  ? Procedure: BASILIC VEIN TRANSPOSITION SECOND STAGE;  Surgeon: Waynetta Sandy, MD;  Location: [place];  Service: Vascular;  Laterality: Left;  ? BREAST BIOPSY Left   ? CESAREAN SECTION    ? X 3 [id number]  ? CHOLECYSTECTOMY    ?  CHOLECYSTECTOMY  1980/s  ? COLECTOMY  2010  ? DIVERTICULOSIS SURGERY-2002  2012  ? FEMORAL-POPLITEAL BYPASS GRAFT Left 08/13/2013  ? Procedure: BYPASS GRAFT FEMORAL-POPLITEAL ARTERY WITH NON-REVERSED SAPHANEOUS VEIN; ULTRASOUND GUIDED;  Surgeon: Mal [name], MD;  Location: [name];  Service: Vascular;  Laterality: Left;  ? INSERTION OF DIALYSIS CATHETER Right 04/30/2018  ? Procedure: INSERTION OF Right Internal Jugular DIALYSIS  CATHETER;  Surgeon: [name], MD;  Location: [facility] OR;  Service: Vascular;  Laterality: Right;  ? IR FLUORO GUIDE CV LINE LEFT  05/01/2022  ? IR FLUORO GUIDE CV LINE RIGHT  07/24/2020  ? IR FLUORO GUIDE CV LINE RIGHT  07/21/2021  ? IR REMOVAL TUN CV CATH W/O FL  07/21/2020  ? IR REMOVAL TUN CV CATH W/O FL  07/19/2021  ? IR US GUIDE VASC ACCESS RIGHT  07/24/2020  ? IR US GUIDE VASC ACCESS RIGHT  07/21/2021  ? LEFT HEART CATH AND CORONARY ANGIOGRAPHY N/A 05/01/2021  ? Procedure: LEFT HEART CATH AND CORONARY ANGIOGRAPHY;  Surgeon: [name], MD;  Location: [place] CV LAB;  Service: Cardiovascular;  Laterality: N/A;  ? LEFT HEART CATH AND CORONARY ANGIOGRAPHY N/A 05/02/2022  ? Procedure: LEFT HEART CATH AND CORONARY ANGIOGRAPHY;  Surgeon: [name], MD;  Location: [place] CV LAB;  Service: Cardiovascular;  Laterality: N/A;  ? LOWER EXTREMITY ANGIOGRAM Left 07/26/2013  ? Procedure: LOWER EXTREMITY ANGIOGRAM;  Surgeon: [name] Wilson, MD;  Location: [facility] CATH LAB;  Service: Cardiovascular;  Laterality: Left;  ? ?Family History  ?Problem Relation Age of Onset  ? Cancer Mother 59  ?     breast and bone  ? Cancer Father 96  ?     prostate  ? Hypertension Sister   ? Bleeding Disorder Sister   ? Cancer Cousin 20  ?     breast cancer   ? Hypertension Daughter   ? Breast cancer Neg Hx   ?  ?Social History  ? ?Tobacco Use  ? Smoking status: Some Days  ?  Packs/day: 0.50  ?  Years: 50.00  ?  Pack years: 25.00  ?  Types: Cigarettes  ?  Last attempt to quit: 02/03/2021  ?  Years since quitting: 1.2  ? Smokeless tobacco: Former  ? Tobacco comments:  ?  She is using a vape  ?Substance Use Topics  ? Alcohol use: No  ?  Alcohol/week: 0.0 standard drinks  ?  Comment: she used to drink alcohol, quit in 2010; h/o heavy use   ? ?Marital Status: Single  ? ?ROS  ?Review of Systems  ?Cardiovascular:  Negative for chest pain, claudication, leg swelling, near-syncope, orthopnea, palpitations, paroxysmal nocturnal dyspnea and syncope.  ?Respiratory:   Negative for shortness of breath.   ? ?Objective  ?Blood pressure 118/65, pulse 97, temperature 98.2 ?F (36.8 ?C), temperature source Temporal, resp. rate 17, height '5\' 2"'$  (1.575 m), weight 205 lb 6.4 oz (93.2 kg), SpO2 93 %.  ? ?  05/14/2022  ? 12:32 PM 05/03/2022  ? 11:37 AM 05/03/2022  ? 10:42 AM  ?Vitals with BMI  ?Height '5\' 2"'$     ?Weight 205 lbs 6 oz  207 lbs 7 oz  ?BMI 37.56  37.93  ?Systolic 532 992   ?Diastolic 65 61   ?Pulse 97 118   ?  ?Physical Exam ?Vitals reviewed.  ?Constitutional:   ?   Appearance: She is obese.  ?Cardiovascular:  ?   Rate and Rhythm: Normal rate and regular rhythm.  ?  Pulses: Intact distal pulses.  ?   Heart sounds: S1 normal and S2 normal. Murmur heard.  ?Harsh midsystolic murmur is present at the upper right sternal border.  ?  No friction rub. No gallop.  ?   Comments: Dialysis catheter in place  ?Radial access site well-healed with mild surrounding ecchymosis.  No evidence of hematoma or bruit. ?Pulmonary:  ?   Effort: Pulmonary effort is normal. No respiratory distress.  ?   Breath sounds: No wheezing, rhonchi or rales.  ?Musculoskeletal:  ?   Right lower leg: No edema.  ?   Left lower leg: No edema.  ?Neurological:  ?   Mental Status: She is alert.  ? ?Laboratory examination:  ? ?Recent Labs  ?  05/01/22 ?2774 05/01/22 ?1357 05/01/22 ?2219 05/03/22 ?0118  ?NA 133* 132* 136 132*  ?K 7.2* 7.2* 3.8 5.7*  ?CL 101 103 101 99  ?CO2 17* 13*  --  20*  ?GLUCOSE 95 60* 60* 219*  ?BUN 71* 74* 27* 41*  ?CREATININE 15.23* 15.55* 7.90* 10.62*  ?CALCIUM 8.4* 7.9*  --  7.9*  ?GFRNONAA 2* 2*  --  4*  ? ?estimated creatinine clearance is 5.5 mL/min (A) (by C-G formula based on SCr of 10.62 mg/dL (H)).  ? ?  Latest Ref Rng & Units 05/03/2022  ?  1:18 AM 05/01/2022  ? 10:19 PM 05/01/2022  ?  1:57 PM  ?CMP  ?Glucose 70 - 99 mg/dL 219   60   60    ?BUN 8 - 23 mg/dL 41   27   74    ?Creatinine 0.44 - 1.00 mg/dL 10.62   7.90   15.55    ?Sodium 135 - 145 mmol/L 132   136   132    ?Potassium 3.5 - 5.1 mmol/L 5.7    3.8   7.2    ?Chloride 98 - 111 mmol/L 99   101   103    ?CO2 22 - 32 mmol/L 20    13    ?Calcium 8.9 - 10.3 mg/dL 7.9    7.9    ? ? ?  Latest Ref Rng & Units 05/03/2022  ?  1:18 AM 05/01/2022  ? 10:19 PM 5/

## 2022-05-15 ENCOUNTER — Other Ambulatory Visit: Payer: Self-pay

## 2022-05-15 DIAGNOSIS — N186 End stage renal disease: Secondary | ICD-10-CM | POA: Diagnosis not present

## 2022-05-15 DIAGNOSIS — N2581 Secondary hyperparathyroidism of renal origin: Secondary | ICD-10-CM | POA: Diagnosis not present

## 2022-05-15 DIAGNOSIS — N183 Anemia in chronic kidney disease: Secondary | ICD-10-CM

## 2022-05-15 DIAGNOSIS — R7881 Bacteremia: Secondary | ICD-10-CM | POA: Diagnosis not present

## 2022-05-15 DIAGNOSIS — Z992 Dependence on renal dialysis: Secondary | ICD-10-CM | POA: Diagnosis not present

## 2022-05-15 DIAGNOSIS — D689 Coagulation defect, unspecified: Secondary | ICD-10-CM | POA: Diagnosis not present

## 2022-05-15 DIAGNOSIS — D631 Anemia in chronic kidney disease: Secondary | ICD-10-CM | POA: Diagnosis not present

## 2022-05-16 ENCOUNTER — Emergency Department (HOSPITAL_COMMUNITY): Payer: Medicare Other

## 2022-05-16 ENCOUNTER — Inpatient Hospital Stay (HOSPITAL_COMMUNITY)
Admission: EM | Admit: 2022-05-16 | Discharge: 2022-05-22 | DRG: 252 | Disposition: A | Discharge status: Home Health | Payer: Medicare Other | Attending: Internal Medicine | Admitting: Internal Medicine

## 2022-05-16 ENCOUNTER — Encounter (HOSPITAL_COMMUNITY): Payer: Self-pay | Admitting: Emergency Medicine

## 2022-05-16 ENCOUNTER — Telehealth: Payer: Self-pay

## 2022-05-16 ENCOUNTER — Encounter: Payer: Self-pay | Admitting: Hematology

## 2022-05-16 ENCOUNTER — Inpatient Hospital Stay: Payer: Medicare Other | Attending: Hematology | Admitting: Hematology

## 2022-05-16 ENCOUNTER — Inpatient Hospital Stay: Payer: Medicare Other

## 2022-05-16 ENCOUNTER — Inpatient Hospital Stay (HOSPITAL_COMMUNITY): Payer: Medicare Other

## 2022-05-16 ENCOUNTER — Other Ambulatory Visit: Payer: Self-pay

## 2022-05-16 VITALS — BP 80/62 | HR 109 | Temp 98.2°F | Resp 20

## 2022-05-16 DIAGNOSIS — Z86711 Personal history of pulmonary embolism: Secondary | ICD-10-CM | POA: Diagnosis not present

## 2022-05-16 DIAGNOSIS — J449 Chronic obstructive pulmonary disease, unspecified: Secondary | ICD-10-CM | POA: Diagnosis not present

## 2022-05-16 DIAGNOSIS — N183 Chronic kidney disease, stage 3 unspecified: Secondary | ICD-10-CM

## 2022-05-16 DIAGNOSIS — I959 Hypotension, unspecified: Secondary | ICD-10-CM | POA: Insufficient documentation

## 2022-05-16 DIAGNOSIS — W19XXXA Unspecified fall, initial encounter: Secondary | ICD-10-CM | POA: Diagnosis not present

## 2022-05-16 DIAGNOSIS — R0602 Shortness of breath: Secondary | ICD-10-CM | POA: Diagnosis not present

## 2022-05-16 DIAGNOSIS — Z8249 Family history of ischemic heart disease and other diseases of the circulatory system: Secondary | ICD-10-CM

## 2022-05-16 DIAGNOSIS — D72829 Elevated white blood cell count, unspecified: Secondary | ICD-10-CM | POA: Diagnosis not present

## 2022-05-16 DIAGNOSIS — M25552 Pain in left hip: Secondary | ICD-10-CM

## 2022-05-16 DIAGNOSIS — I5032 Chronic diastolic (congestive) heart failure: Secondary | ICD-10-CM | POA: Diagnosis present

## 2022-05-16 DIAGNOSIS — Z20822 Contact with and (suspected) exposure to covid-19: Secondary | ICD-10-CM | POA: Diagnosis present

## 2022-05-16 DIAGNOSIS — E785 Hyperlipidemia, unspecified: Secondary | ICD-10-CM | POA: Diagnosis present

## 2022-05-16 DIAGNOSIS — I10 Essential (primary) hypertension: Secondary | ICD-10-CM | POA: Diagnosis not present

## 2022-05-16 DIAGNOSIS — Z91041 Radiographic dye allergy status: Secondary | ICD-10-CM

## 2022-05-16 DIAGNOSIS — M898X9 Other specified disorders of bone, unspecified site: Secondary | ICD-10-CM | POA: Diagnosis present

## 2022-05-16 DIAGNOSIS — Z85038 Personal history of other malignant neoplasm of large intestine: Secondary | ICD-10-CM | POA: Diagnosis not present

## 2022-05-16 DIAGNOSIS — E871 Hypo-osmolality and hyponatremia: Secondary | ICD-10-CM | POA: Diagnosis present

## 2022-05-16 DIAGNOSIS — I951 Orthostatic hypotension: Secondary | ICD-10-CM | POA: Insufficient documentation

## 2022-05-16 DIAGNOSIS — Z86718 Personal history of other venous thrombosis and embolism: Secondary | ICD-10-CM

## 2022-05-16 DIAGNOSIS — G4733 Obstructive sleep apnea (adult) (pediatric): Secondary | ICD-10-CM | POA: Diagnosis not present

## 2022-05-16 DIAGNOSIS — M25452 Effusion, left hip: Secondary | ICD-10-CM | POA: Diagnosis not present

## 2022-05-16 DIAGNOSIS — Z7901 Long term (current) use of anticoagulants: Secondary | ICD-10-CM

## 2022-05-16 DIAGNOSIS — I950 Idiopathic hypotension: Secondary | ICD-10-CM | POA: Diagnosis not present

## 2022-05-16 DIAGNOSIS — Z7982 Long term (current) use of aspirin: Secondary | ICD-10-CM

## 2022-05-16 DIAGNOSIS — M1612 Unilateral primary osteoarthritis, left hip: Secondary | ICD-10-CM | POA: Diagnosis not present

## 2022-05-16 DIAGNOSIS — R531 Weakness: Secondary | ICD-10-CM | POA: Diagnosis not present

## 2022-05-16 DIAGNOSIS — I214 Non-ST elevation (NSTEMI) myocardial infarction: Secondary | ICD-10-CM | POA: Diagnosis not present

## 2022-05-16 DIAGNOSIS — F32A Depression, unspecified: Secondary | ICD-10-CM | POA: Diagnosis not present

## 2022-05-16 DIAGNOSIS — Z888 Allergy status to other drugs, medicaments and biological substances status: Secondary | ICD-10-CM | POA: Diagnosis not present

## 2022-05-16 DIAGNOSIS — Z79899 Other long term (current) drug therapy: Secondary | ICD-10-CM

## 2022-05-16 DIAGNOSIS — R69 Illness, unspecified: Secondary | ICD-10-CM

## 2022-05-16 DIAGNOSIS — E875 Hyperkalemia: Secondary | ICD-10-CM | POA: Diagnosis not present

## 2022-05-16 DIAGNOSIS — Z6836 Body mass index (BMI) 36.0-36.9, adult: Secondary | ICD-10-CM

## 2022-05-16 DIAGNOSIS — I132 Hypertensive heart and chronic kidney disease with heart failure and with stage 5 chronic kidney disease, or end stage renal disease: Secondary | ICD-10-CM | POA: Diagnosis present

## 2022-05-16 DIAGNOSIS — A4101 Sepsis due to Methicillin susceptible Staphylococcus aureus: Secondary | ICD-10-CM | POA: Diagnosis not present

## 2022-05-16 DIAGNOSIS — T8241XA Breakdown (mechanical) of vascular dialysis catheter, initial encounter: Secondary | ICD-10-CM | POA: Diagnosis not present

## 2022-05-16 DIAGNOSIS — I82C13 Acute embolism and thrombosis of internal jugular vein, bilateral: Secondary | ICD-10-CM | POA: Diagnosis present

## 2022-05-16 DIAGNOSIS — G8929 Other chronic pain: Secondary | ICD-10-CM | POA: Diagnosis present

## 2022-05-16 DIAGNOSIS — Z789 Other specified health status: Secondary | ICD-10-CM

## 2022-05-16 DIAGNOSIS — Z992 Dependence on renal dialysis: Secondary | ICD-10-CM

## 2022-05-16 DIAGNOSIS — Z720 Tobacco use: Secondary | ICD-10-CM

## 2022-05-16 DIAGNOSIS — Y841 Kidney dialysis as the cause of abnormal reaction of the patient, or of later complication, without mention of misadventure at the time of the procedure: Secondary | ICD-10-CM | POA: Diagnosis present

## 2022-05-16 DIAGNOSIS — A411 Sepsis due to other specified staphylococcus: Secondary | ICD-10-CM

## 2022-05-16 DIAGNOSIS — N186 End stage renal disease: Secondary | ICD-10-CM | POA: Diagnosis present

## 2022-05-16 DIAGNOSIS — B9561 Methicillin susceptible Staphylococcus aureus infection as the cause of diseases classified elsewhere: Secondary | ICD-10-CM | POA: Diagnosis not present

## 2022-05-16 DIAGNOSIS — E1151 Type 2 diabetes mellitus with diabetic peripheral angiopathy without gangrene: Secondary | ICD-10-CM | POA: Diagnosis not present

## 2022-05-16 DIAGNOSIS — D631 Anemia in chronic kidney disease: Secondary | ICD-10-CM

## 2022-05-16 DIAGNOSIS — I871 Compression of vein: Secondary | ICD-10-CM | POA: Diagnosis not present

## 2022-05-16 DIAGNOSIS — T80211A Bloodstream infection due to central venous catheter, initial encounter: Secondary | ICD-10-CM | POA: Diagnosis not present

## 2022-05-16 DIAGNOSIS — M7062 Trochanteric bursitis, left hip: Secondary | ICD-10-CM | POA: Diagnosis present

## 2022-05-16 DIAGNOSIS — I2510 Atherosclerotic heart disease of native coronary artery without angina pectoris: Secondary | ICD-10-CM | POA: Diagnosis not present

## 2022-05-16 DIAGNOSIS — E1122 Type 2 diabetes mellitus with diabetic chronic kidney disease: Secondary | ICD-10-CM | POA: Diagnosis not present

## 2022-05-16 DIAGNOSIS — F17210 Nicotine dependence, cigarettes, uncomplicated: Secondary | ICD-10-CM | POA: Diagnosis present

## 2022-05-16 DIAGNOSIS — R600 Localized edema: Secondary | ICD-10-CM | POA: Diagnosis not present

## 2022-05-16 DIAGNOSIS — I509 Heart failure, unspecified: Secondary | ICD-10-CM | POA: Diagnosis not present

## 2022-05-16 HISTORY — DX: Orthostatic hypotension: I95.1

## 2022-05-16 HISTORY — DX: Pain in left hip: M25.552

## 2022-05-16 LAB — CBC
HCT: 33.4 % — ABNORMAL LOW (ref 36.0–46.0)
Hemoglobin: 10.9 g/dL — ABNORMAL LOW (ref 12.0–15.0)
MCH: 31.1 pg (ref 26.0–34.0)
MCHC: 32.6 g/dL (ref 30.0–36.0)
MCV: 95.2 fL (ref 80.0–100.0)
Platelets: 269 10*3/uL (ref 150–400)
RBC: 3.51 MIL/uL — ABNORMAL LOW (ref 3.87–5.11)
RDW: 15.9 % — ABNORMAL HIGH (ref 11.5–15.5)
WBC: 25.1 10*3/uL — ABNORMAL HIGH (ref 4.0–10.5)
nRBC: 0 % (ref 0.0–0.2)

## 2022-05-16 LAB — BLOOD GAS, VENOUS
Acid-base deficit: 4.7 mmol/L — ABNORMAL HIGH (ref 0.0–2.0)
Bicarbonate: 21.6 mmol/L (ref 20.0–28.0)
O2 Saturation: 49.2 %
Patient temperature: 37
pCO2, Ven: 44 mmHg (ref 44–60)
pH, Ven: 7.3 (ref 7.25–7.43)
pO2, Ven: <31 mmHg — CL (ref 32–45)

## 2022-05-16 LAB — BASIC METABOLIC PANEL
Anion gap: 13 (ref 5–15)
BUN: 41 mg/dL — ABNORMAL HIGH (ref 8–23)
CO2: 20 mmol/L — ABNORMAL LOW (ref 22–32)
Calcium: 8.8 mg/dL — ABNORMAL LOW (ref 8.9–10.3)
Chloride: 101 mmol/L (ref 98–111)
Creatinine, Ser: 8.21 mg/dL — ABNORMAL HIGH (ref 0.44–1.00)
GFR, Estimated: 5 mL/min — ABNORMAL LOW (ref >60)
Glucose, Bld: 122 mg/dL — ABNORMAL HIGH (ref 70–99)
Potassium: 5.1 mmol/L (ref 3.5–5.1)
Sodium: 134 mmol/L — ABNORMAL LOW (ref 135–145)

## 2022-05-16 LAB — RESP PANEL BY RT-PCR (FLU A&B, COVID) ARPGX2
Influenza A by PCR: NEGATIVE
Influenza B by PCR: NEGATIVE
SARS Coronavirus 2 by RT PCR: NEGATIVE

## 2022-05-16 LAB — CBG MONITORING, ED: Glucose-Capillary: 110 mg/dL — ABNORMAL HIGH (ref 70–99)

## 2022-05-16 LAB — HEPATIC FUNCTION PANEL
ALT: 16 U/L (ref 0–44)
AST: 21 U/L (ref 15–41)
Albumin: 2.5 g/dL — ABNORMAL LOW (ref 3.5–5.0)
Alkaline Phosphatase: 109 U/L (ref 38–126)
Bilirubin, Direct: <0.1 mg/dL (ref 0.0–0.2)
Total Bilirubin: 0.6 mg/dL (ref 0.3–1.2)
Total Protein: 7.4 g/dL (ref 6.5–8.1)

## 2022-05-16 LAB — PROTIME-INR
INR: 1.2 (ref 0.8–1.2)
Prothrombin Time: 15.5 seconds — ABNORMAL HIGH (ref 11.4–15.2)

## 2022-05-16 LAB — TROPONIN I (HIGH SENSITIVITY)
Troponin I (High Sensitivity): 38 ng/L — ABNORMAL HIGH (ref <18)
Troponin I (High Sensitivity): 39 ng/L — ABNORMAL HIGH (ref <18)

## 2022-05-16 LAB — LACTIC ACID, PLASMA: Lactic Acid, Venous: 1.1 mmol/L (ref 0.5–1.9)

## 2022-05-16 IMAGING — CT CT HEAD W/O CM
4 series · 16 of 47 positions shown, 18 images · non-contrast
Comparison: [DATE].

CLINICAL DATA: Weakness.



[Series 2: coronal soft tissue · coronal · 0.31mm/px · 3 of 69 slices shown]
[im 23/69  brain]
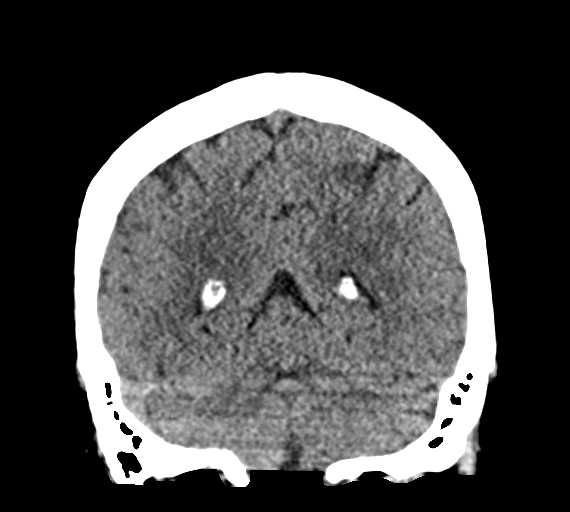
[im 31/69  brain]
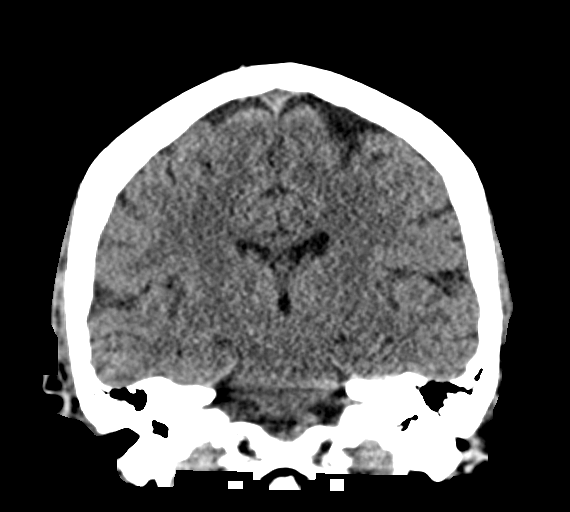
[im 38/69  brain]
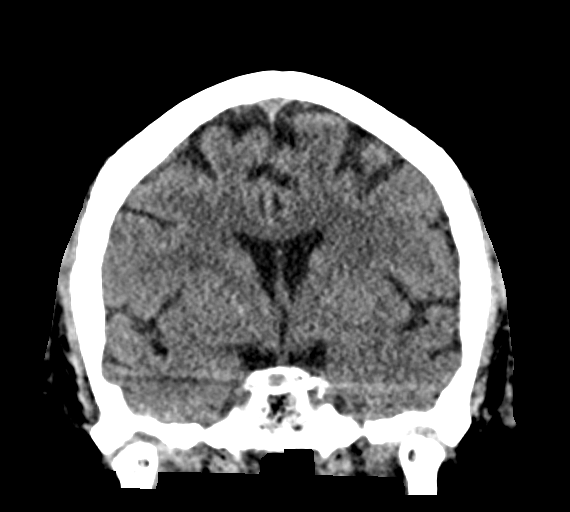

[Series 3: sagittal soft tissue · sagittal · 0.29mm/px · 3 of 60 slices shown]
[im 20/60  brain]
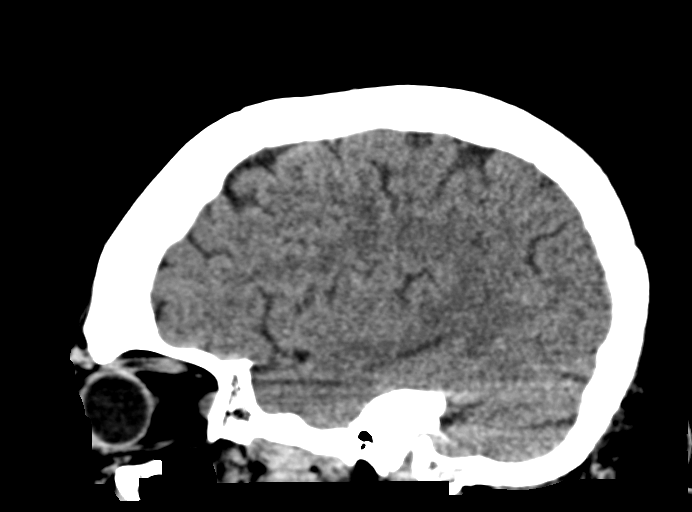
[im 30/60  brain]
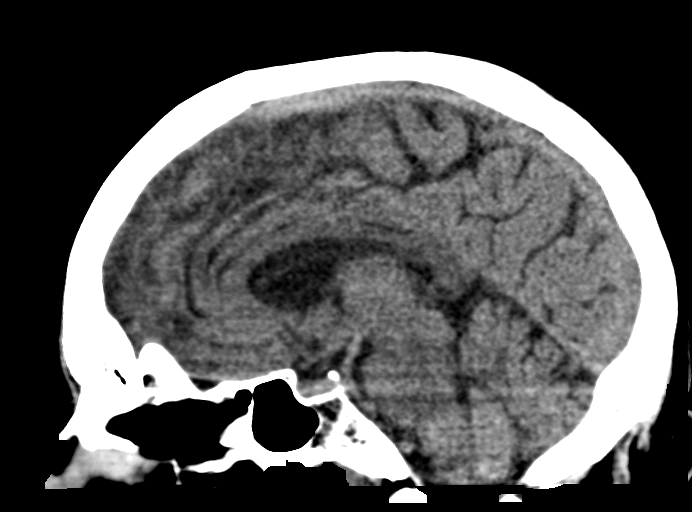
[im 40/60  brain]
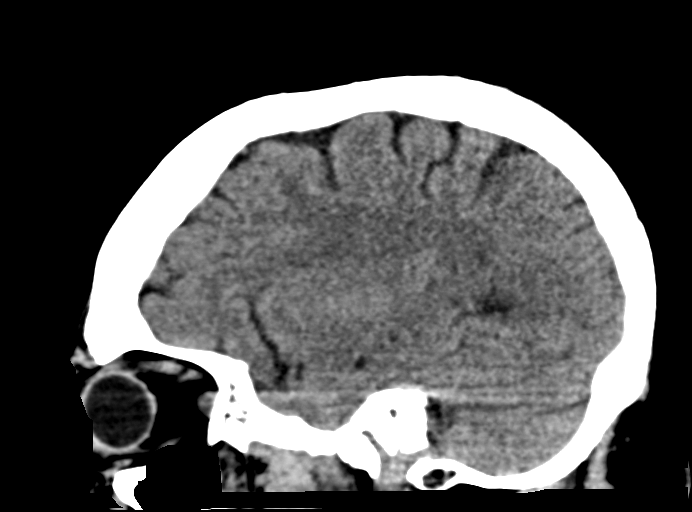

[Series 4: head wo · axial · 0.47mm/px · z∈[-281,-171]mm · 7 of 30 slices shown, 9 images]
[im 4/30  brain]
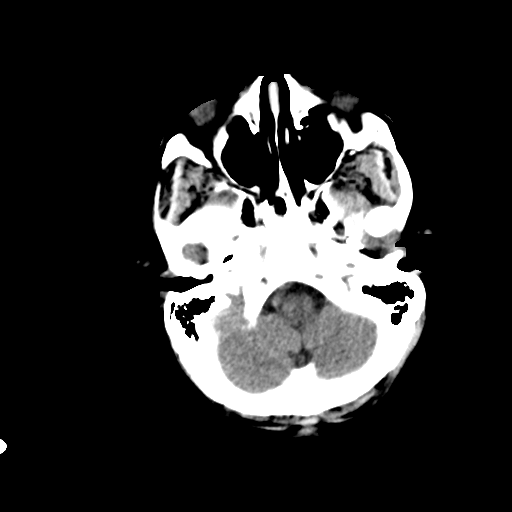
[im 4/30  bone]
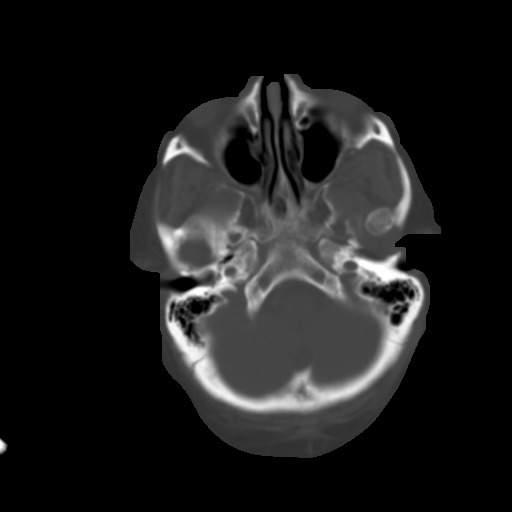
[im 8/30  brain]
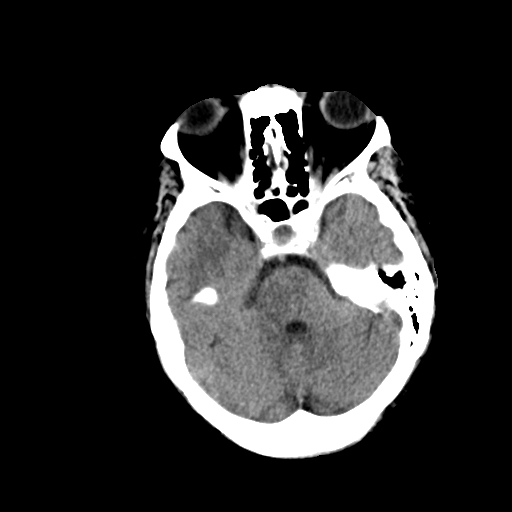
[im 11/30  brain]
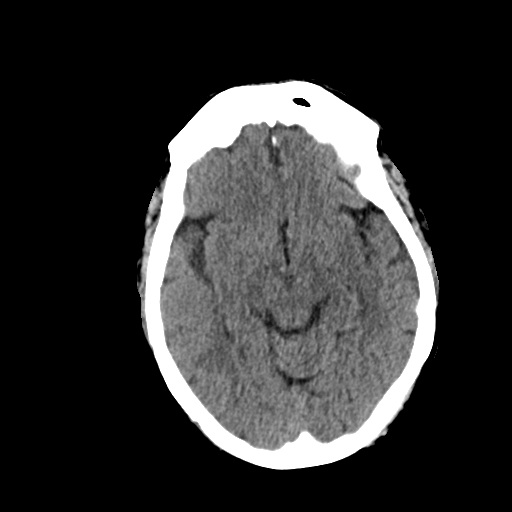
[im 15/30  brain]
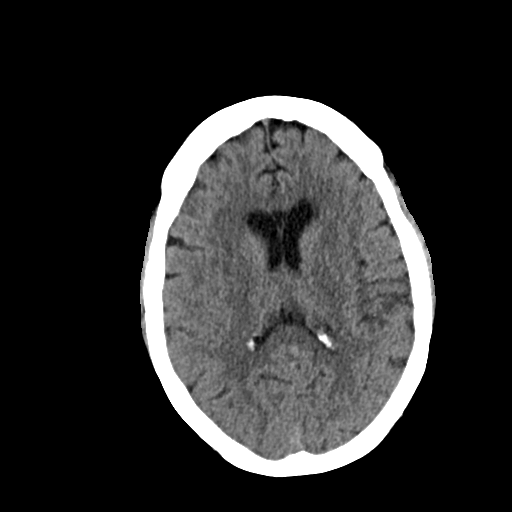
[im 19/30  brain]
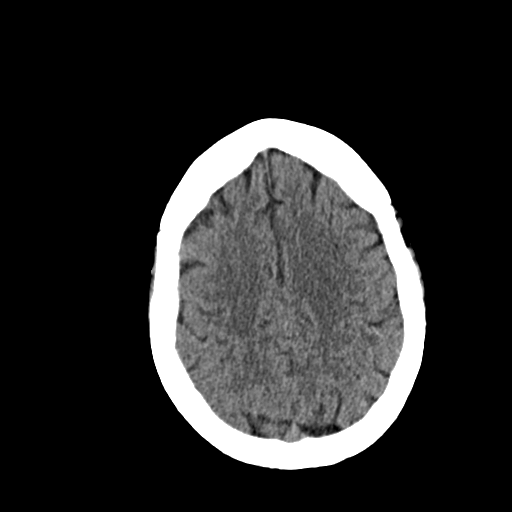
[im 19/30  bone]
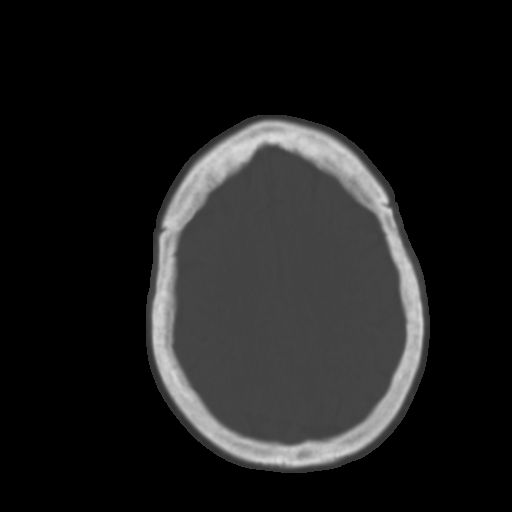
[im 22/30  brain]
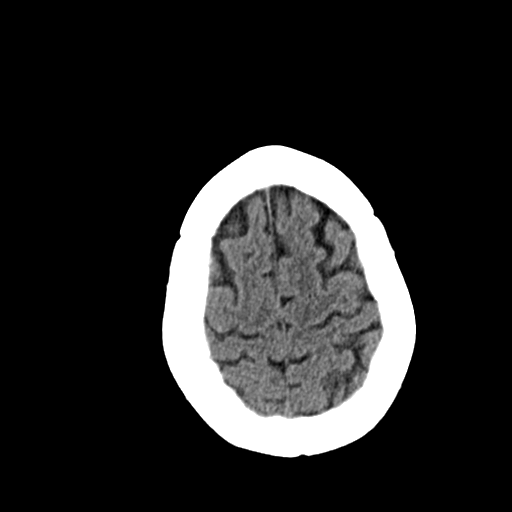
[im 26/30  brain]
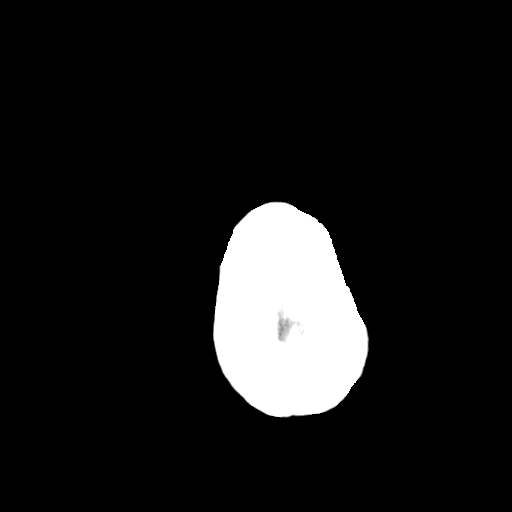

[Series 5: head bone · axial · 0.47mm/px · z∈[-282,-252]mm · 3 of 75 slices shown]
[im 8/75  bone]
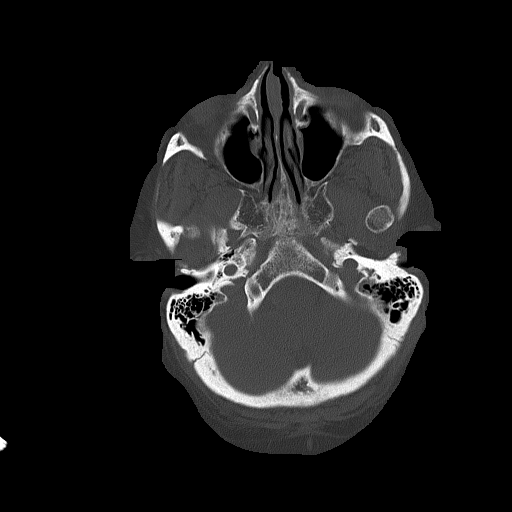
[im 15/75  bone]
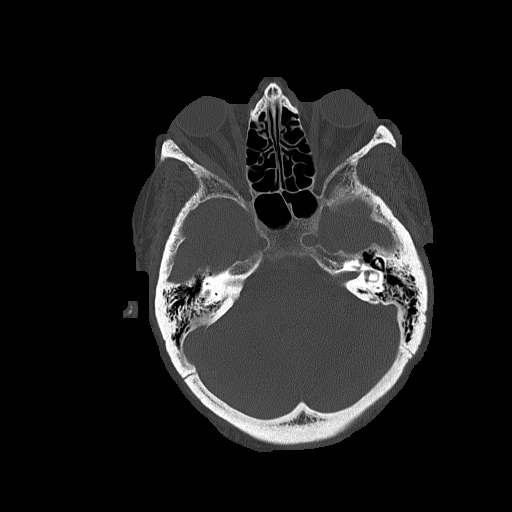
[im 23/75  bone]
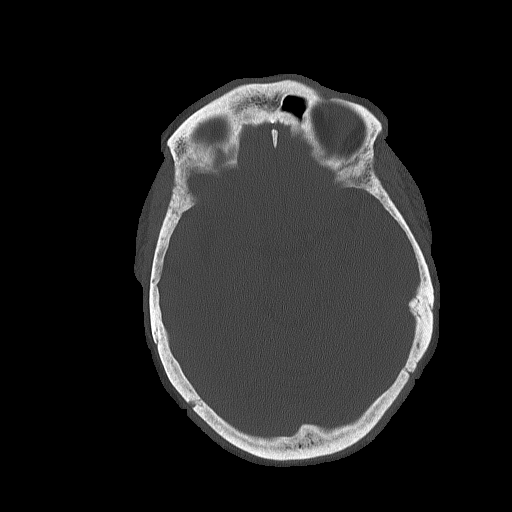

[16 of 47 positions shown; findings below may reference images not displayed]

FINDINGS: Brain: No evidence of acute infarction, hemorrhage, hydrocephalus,
extra-axial collection or mass lesion/mass effect.

Vascular: No hyperdense vessel or unexpected calcification.

Skull: Normal. Negative for fracture or focal lesion.

Sinuses/Orbits: No acute finding.

Other: None.
IMPRESSION: No acute intracranial abnormality seen.

## 2022-05-16 IMAGING — DX DG CHEST 1V PORT
1 series · 1 of 1 positions shown · non-contrast
Comparison: None Available.

CLINICAL DATA: Short of breath,  end-stage renal disease

EXAM:
PORTABLE CHEST 1 VIEW

[chest ap]
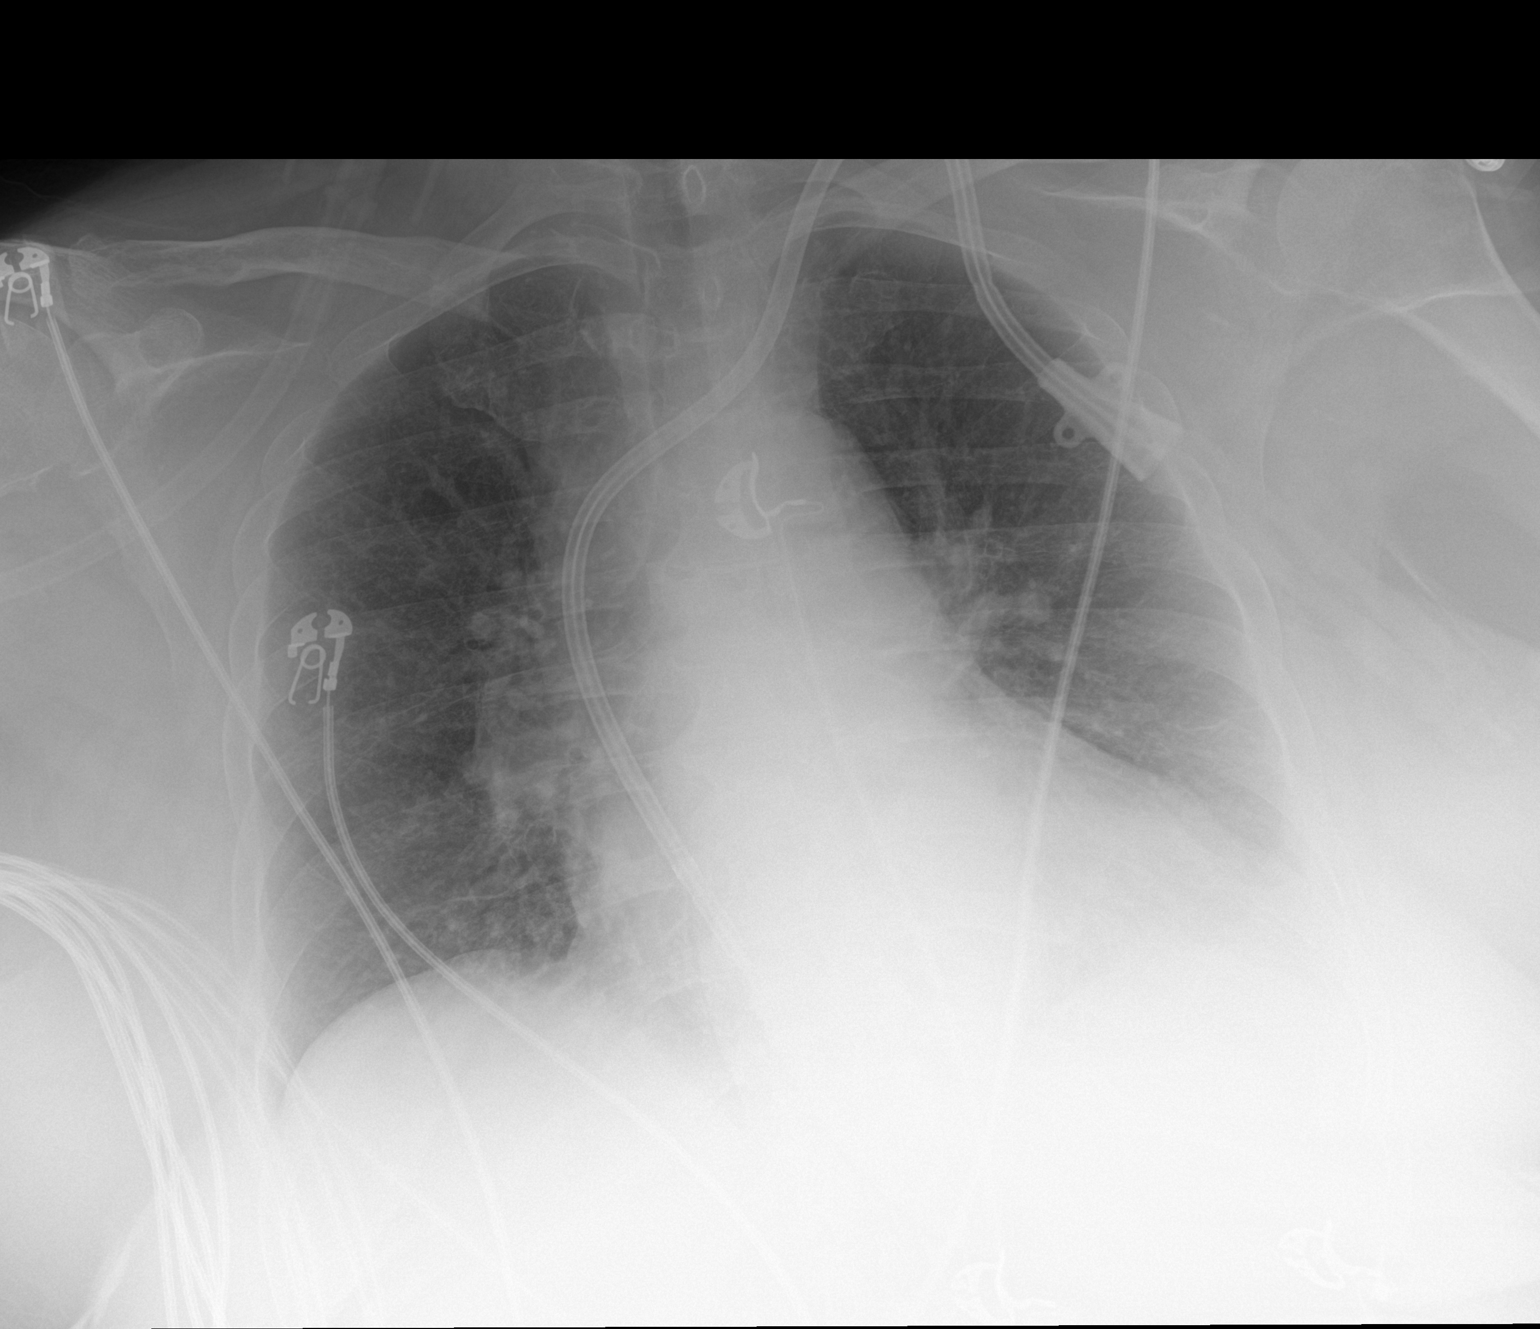

[1 of 1 positions shown; findings below may reference images not displayed]

FINDINGS: LEFT-sided central venous line tip in the distal SVC. Stable cardiac
silhouette. Mild central venous congestion pattern. Low lung
volumes.
IMPRESSION: Central venous congestion.  No overt pulmonary edema.

## 2022-05-16 IMAGING — MR MR HIP*L* W/O CM
6 series · 38 of 40 positions shown · non-contrast
Comparison: Radiographs [DATE] and CT scan [DATE]

CLINICAL DATA: Left hip pain.  Leukocytosis.

EXAM:
MR OF THE LEFT HIP WITHOUT CONTRAST
TECHNIQUE: Multiplanar, multisequence MR imaging was performed. No intravenous
contrast was administered.

[Series 4: T1 · coronal · left · 3.0mm · 0.78mm/px · 5 of 26 slices shown]
[im 1/26]
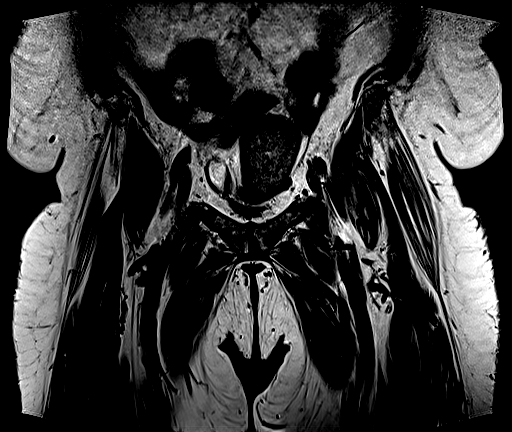
[im 6/26]
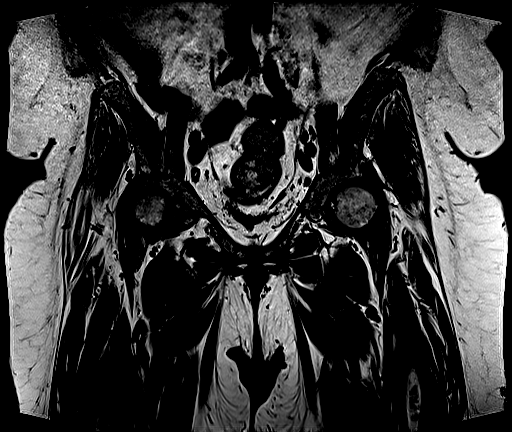
[im 11/26]
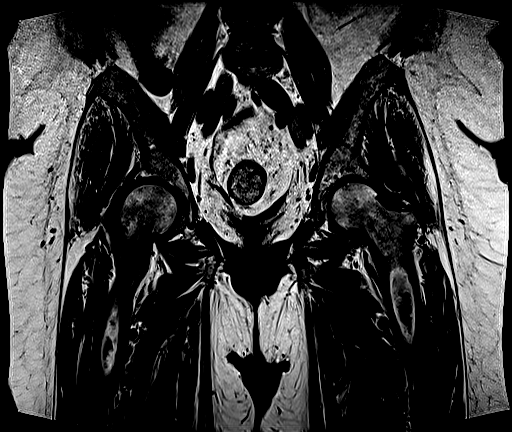
[im 16/26]
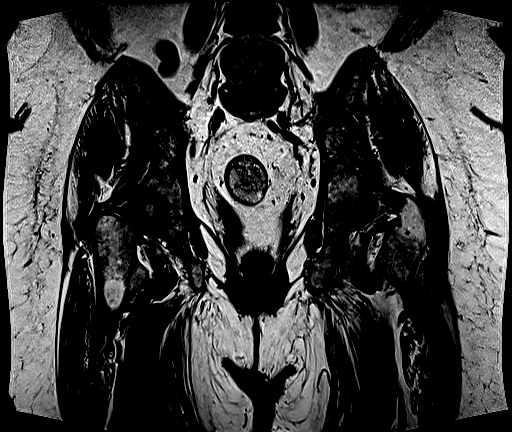
[im 21/26]
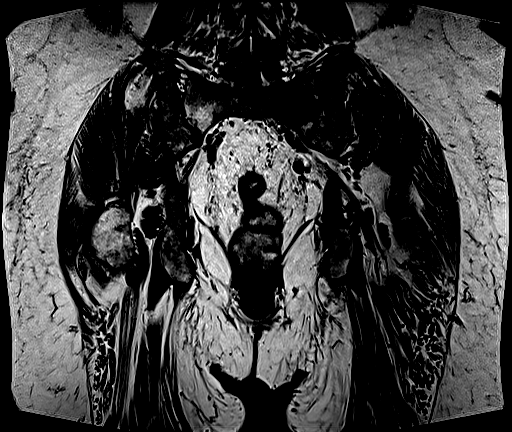

[Series 5: T2 fat-sat · coronal · left · 3.0mm · 1.25mm/px · 6 of 26 slices shown (1 of 2)]
[im 1/26]
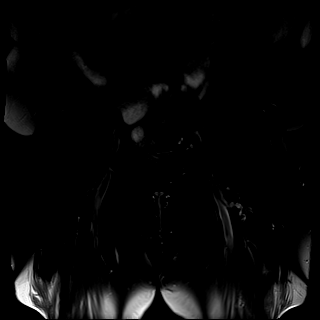
[im 6/26]
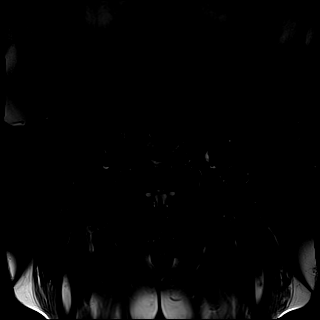
[im 11/26]
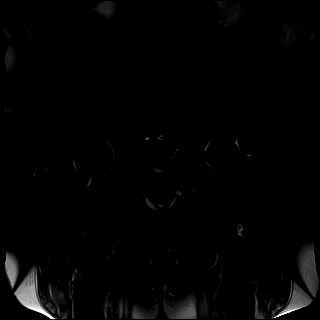
[im 16/26]
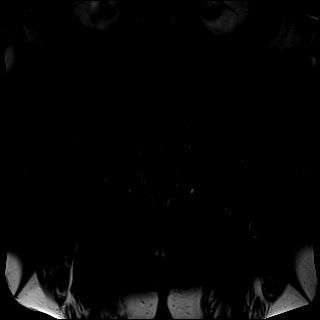
[im 21/26]
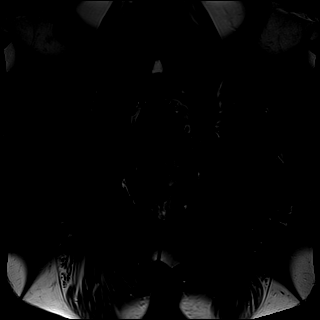
[im 26/26]
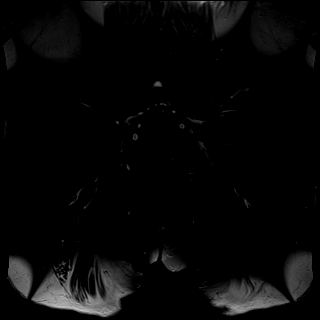

[Series 6: T2 fat-sat · axial · left · 4.5mm · 1.12mm/px · z∈[-43,+175]mm · 8 of 40 slices shown (2 of 2)]
[im 1/40]
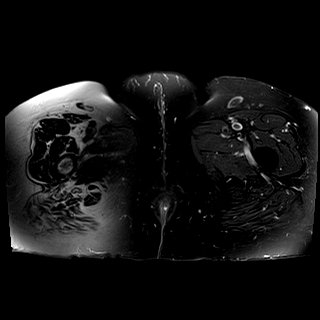
[im 5/40]
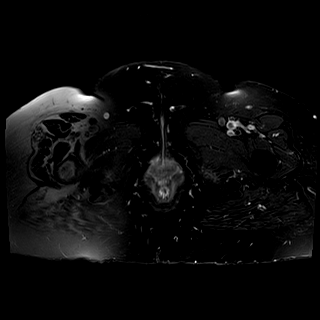
[im 10/40]
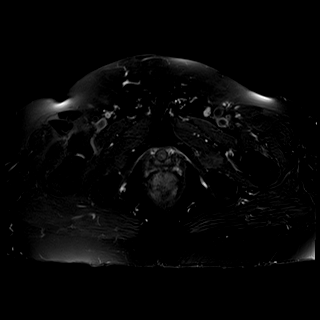
[im 15/40]
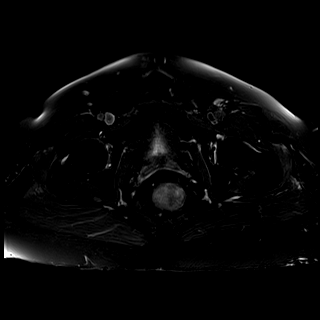
[im 25/40]
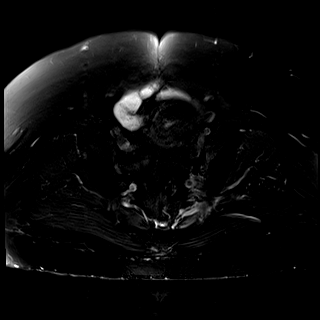
[im 30/40]
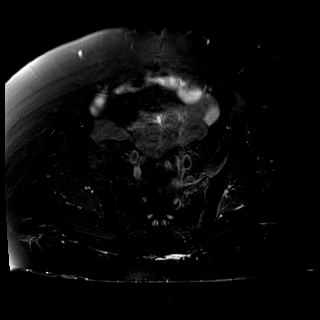
[im 35/40]
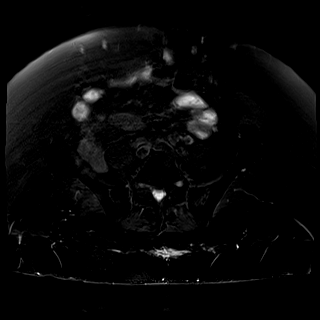
[im 40/40]
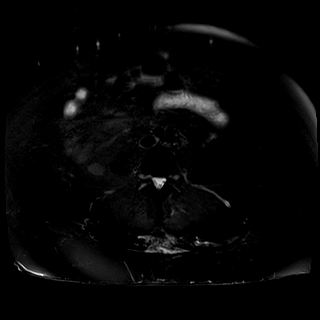

[Series 7: PD fat-sat · coronal · left · 4.0mm · 0.56mm/px · 5 of 20 slices shown (1 of 2)]
[im 1/20]
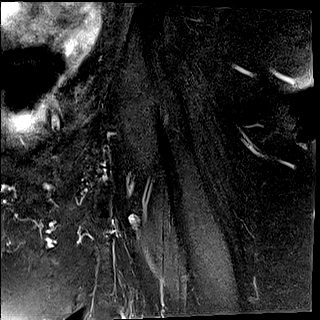
[im 5/20]
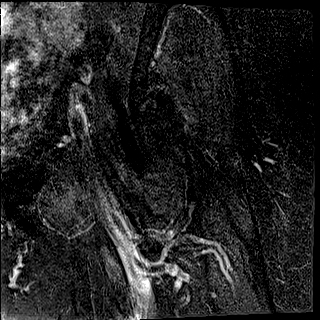
[im 10/20]
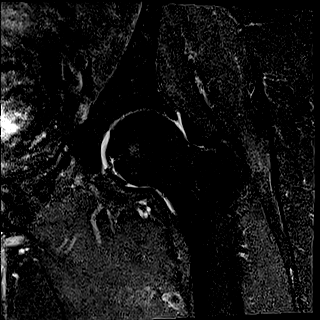
[im 15/20]
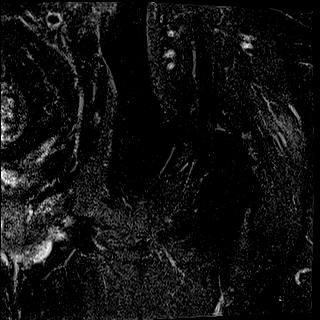
[im 20/20]
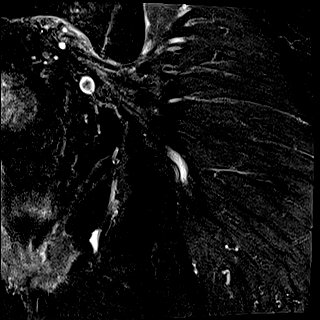

[Series 9: PD fat-sat · sagittal · left · 4.5mm · 0.56mm/px · 5 of 24 slices shown (2 of 2)]
[im 1/24]
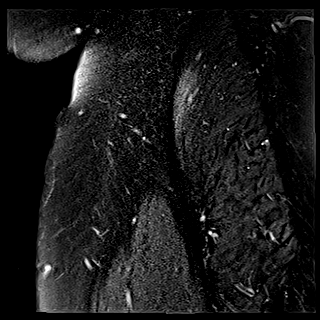
[im 6/24]
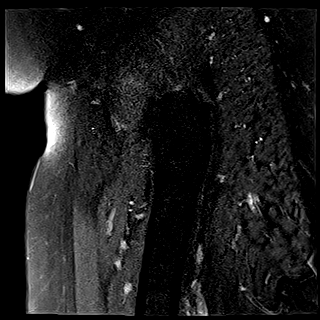
[im 12/24]
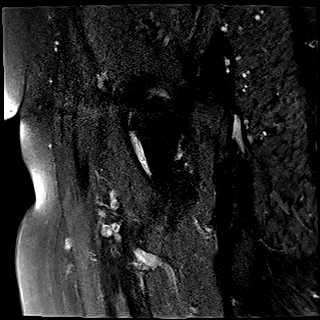
[im 18/24]
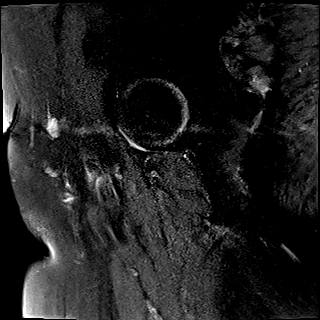
[im 24/24]
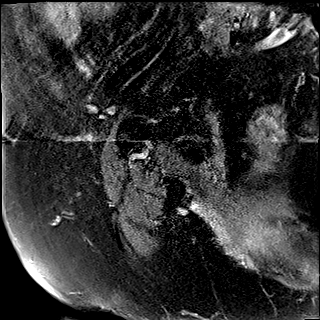

[Series 10: PD · axial · left · 4.5mm · 0.56mm/px · z∈[-96,+121]mm · 9 of 40 slices shown]
[im 1/40]
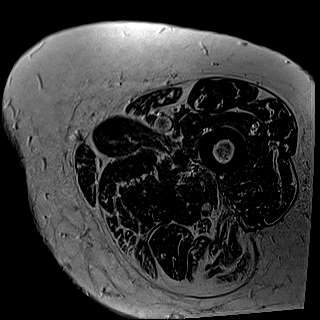
[im 5/40]
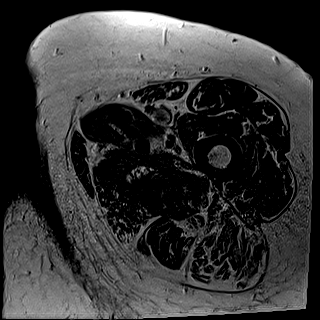
[im 10/40]
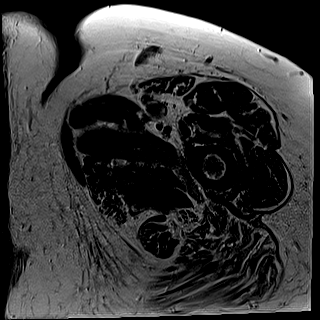
[im 15/40]
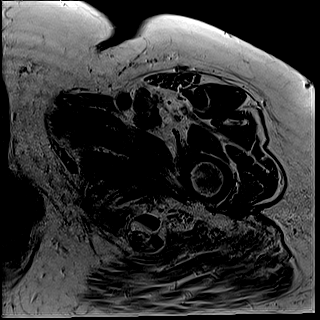
[im 20/40]
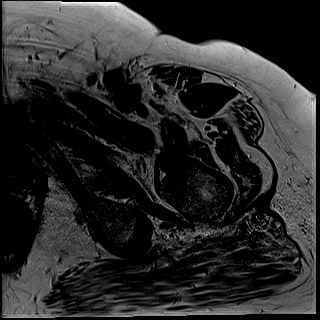
[im 25/40]
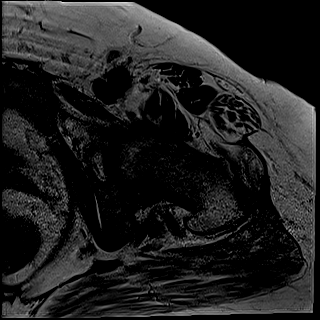
[im 30/40]
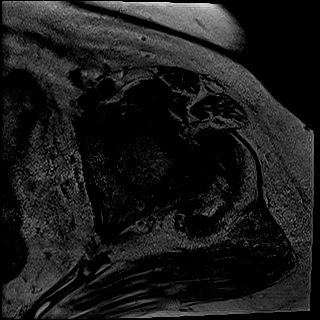
[im 35/40]
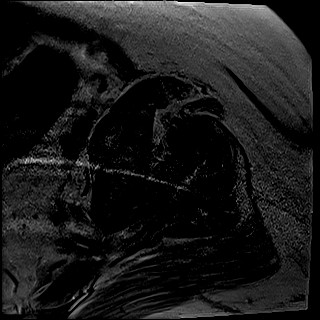
[im 40/40]
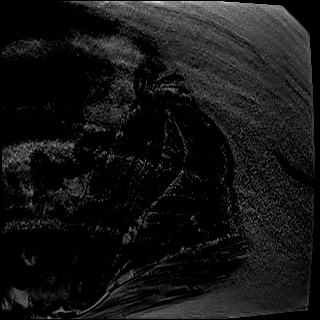

[38 of 40 positions shown; findings below may reference images not displayed]

FINDINGS: Bones: Nonspecific abnormal edema signal in the left sacral ala, and
tracking along the posterior margin of the left iliacus muscle.
There is presacral edema in this region, tracking around the left
sacral plexus for example on image 12 series 6. Further distally the
sciatic nerve appears normal. No effusion of the SI joint; no marrow
edema along the iliac side of the left SI joint. Today's exam was
focused on the left hip and not on the sacrum.

There is no hip effusion or marrow abnormality in the left proximal
femur. Mild degenerative spurring of both femoral heads.

Articular cartilage and labrum

Articular cartilage:  Unremarkable

Labrum:  Grossly unremarkable.

Joint or bursal effusion

Joint effusion:  Absent

Bursae: No regional bursitis.

Muscles and tendons

Muscles and tendons: Small amount of abnormal edema signal
anteriorly in the left gluteus maximus muscle near the left greater
trochanter as shown on image 23 series 6. As noted above there is
some low-grade edema tracking along the deep margin of the left
iliacus muscle.

Other findings

Miscellaneous:   No supplemental non-categorized findings.
IMPRESSION: 1. Abnormal edema in the left sacral ala is infiltrative but
nonspecific. A stress fracture could potentially give this
appearance and the adjacent edema tracking along the left iliacus
muscle and left sacral plexus. Left sacral osteomyelitis is not
totally excluded but there no effusion of the SI joint or edema
along the iliac side of the SI joint to further suggest septic SI
joint.
2. Low-level edema anteriorly in the gluteus maximus muscle near the
greater trochanter, nonspecific but possibly reflecting a muscle
strain.

## 2022-05-16 MED ORDER — OXYCODONE HCL 5 MG PO TABS
5.0000 mg | ORAL_TABLET | ORAL | Status: DC | PRN
  Administered 2022-05-16 – 2022-05-22 (×5): 5 mg via ORAL
  Filled 2022-05-16 (×6): qty 1

## 2022-05-16 MED ORDER — FERRIC CITRATE 1 GM 210 MG(FE) PO TABS
840.0000 mg | ORAL_TABLET | Freq: Three times a day (TID) | ORAL | Status: DC
  Administered 2022-05-17 – 2022-05-22 (×11): 840 mg via ORAL
  Filled 2022-05-16 (×13): qty 4

## 2022-05-16 MED ORDER — FERRIC CITRATE 1 GM 210 MG(FE) PO TABS: 420.0000 mg | ORAL_TABLET | ORAL | Status: DC

## 2022-05-16 MED ORDER — APIXABAN 2.5 MG PO TABS
2.5000 mg | ORAL_TABLET | Freq: Two times a day (BID) | ORAL | Status: DC
  Administered 2022-05-16 – 2022-05-20 (×8): 2.5 mg via ORAL
  Filled 2022-05-16 (×8): qty 1

## 2022-05-16 MED ORDER — ASPIRIN 81 MG PO CHEW
81.0000 mg | CHEWABLE_TABLET | Freq: Every day | ORAL | Status: DC
  Administered 2022-05-16 – 2022-05-22 (×7): 81 mg via ORAL
  Filled 2022-05-16 (×7): qty 1

## 2022-05-16 MED ORDER — ACETAMINOPHEN 325 MG PO TABS
650.0000 mg | ORAL_TABLET | Freq: Four times a day (QID) | ORAL | Status: DC | PRN
  Administered 2022-05-16 – 2022-05-21 (×5): 650 mg via ORAL
  Filled 2022-05-16 (×5): qty 2

## 2022-05-16 MED ORDER — MIDODRINE HCL 5 MG PO TABS
5.0000 mg | ORAL_TABLET | ORAL | Status: DC
  Administered 2022-05-17 – 2022-05-22 (×2): 5 mg via ORAL
  Filled 2022-05-16 (×2): qty 1

## 2022-05-16 MED ORDER — IPRATROPIUM-ALBUTEROL 0.5-2.5 (3) MG/3ML IN SOLN
3.0000 mL | Freq: Once | RESPIRATORY_TRACT | Status: AC
  Administered 2022-05-16: 3 mL via RESPIRATORY_TRACT
  Filled 2022-05-16: qty 3

## 2022-05-16 MED ORDER — FERRIC CITRATE 1 GM 210 MG(FE) PO TABS
420.0000 mg | ORAL_TABLET | ORAL | Status: DC | PRN
Start: 2022-05-16 — End: 2022-05-22

## 2022-05-16 MED ORDER — VANCOMYCIN HCL 2000 MG/400ML IV SOLN
2000.0000 mg | Freq: Once | INTRAVENOUS | Status: AC
  Administered 2022-05-16: 2000 mg via INTRAVENOUS
  Filled 2022-05-16: qty 400

## 2022-05-16 MED ORDER — ACETAMINOPHEN 650 MG RE SUPP: 650.0000 mg | Freq: Four times a day (QID) | RECTAL | Status: DC | PRN

## 2022-05-16 MED ORDER — SODIUM CHLORIDE 0.9 % IV SOLN
2.0000 g | Freq: Once | INTRAVENOUS | Status: AC
  Administered 2022-05-16: 2 g via INTRAVENOUS
  Filled 2022-05-16: qty 12.5

## 2022-05-16 MED ORDER — ALBUTEROL SULFATE (2.5 MG/3ML) 0.083% IN NEBU: 3.0000 mL | INHALATION_SOLUTION | Freq: Four times a day (QID) | RESPIRATORY_TRACT | Status: DC | PRN

## 2022-05-16 MED ORDER — GABAPENTIN 300 MG PO CAPS
300.0000 mg | ORAL_CAPSULE | Freq: Every day | ORAL | Status: DC
  Administered 2022-05-16 – 2022-05-22 (×7): 300 mg via ORAL
  Filled 2022-05-16 (×7): qty 1

## 2022-05-16 MED ORDER — ATORVASTATIN CALCIUM 10 MG PO TABS
10.0000 mg | ORAL_TABLET | Freq: Every evening | ORAL | Status: DC
  Administered 2022-05-16 – 2022-05-21 (×6): 10 mg via ORAL
  Filled 2022-05-16 (×6): qty 1

## 2022-05-16 NOTE — Telephone Encounter (Signed)
This nurse reached out to patient to remind her of her office visit for today at 1 pm.  Advised per provider that she did not need to have labs drawn at 1230 pm due to her recent hospital visit we have current labs.  Patient acknowledged understanding and confirmed that she will be here for visit.  No further questions or concerns at this time.

## 2022-05-16 NOTE — Assessment & Plan Note (Signed)
-   Appears to be euvolemic at this time -Continue volume management per dialysis

## 2022-05-16 NOTE — Progress Notes (Signed)
A consult was received from an ED physician for vancomycin and cefepime per pharmacy dosing.  The patient's profile has been reviewed for ht/wt/allergies/indication/available labs.   A one time order has been placed for vancomycin 2g and cefepime 2g.  Further antibiotics/pharmacy consults should be ordered by admitting physician if indicated.                       Thank you, Peggyann Juba, PharmD, BCPS 05/16/2022  5:01 PM

## 2022-05-16 NOTE — ED Provider Notes (Signed)
[facility] DEPT Provider Note   CSN: [id number] Arrival date & time: 05/16/22  1349     History  Chief Complaint  Patient presents with   Weakness   Hypotension    Whitney Walker is a 66 y.o. female.  HPI Patient has complex medical history including ESRD Monday Wednesday Friday last dialysis yesterday.  She was last discharged from the hospital [phone].  At time she was treated for malfunctioning hemodialysis catheter that was changed by IR.  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  This morning, the patient's daughter went to bring her breakfast.  Her daughter reports that her mom answered the door with her walker in front of her.  When her mom turned to go into the kitchen, she tripped over a bag that she keeps on the walker.  This caused her to fall onto her knees.  [name] daughter reports she was able to help her back up and then the patient sat down at the table and they started to have some breakfast.  [name] daughter felt like her mom was breathing more intensely.  Patient denies that she is having any chest pain.  Patient is equivocal on being more short of breath than baseline.  She reports she has been somewhat more short of breath for a while.  Patient reports she did feel right after dialysis yesterday.  Patient mainly notes that she has been very weak.  She has felt generally very weak and extremely fatigued ever since her hospitalization.  Patient patient's daughter is in the patient seems to be sleeping more.  Patient reports that she is got constant severe pain in the left hip.  This has been a chronic problem.  She is weak with walking but also she is having so much pain that is interfering with her walking.  [name] daughter wonders if patient is taking too many medications.  She reports due to pain today the  patient took 4 acetaminophen tablets and 2 of her oxycodone and possibly Neurontin.    Home Medications Prior to Admission medications   Medication Sig Start Date End Date Taking? Authorizing Provider  acetaminophen (TYLENOL) 650 MG CR tablet Take 1,950-2,600 mg by mouth 2 (two) times daily as needed for pain. Do not exceed more than '4000mg'$  (or 6 capsules) in 24hrs.    [provider]  albuterol (VENTOLIN HFA) 108 (90 Base) MCG/ACT inhaler Inhale 2 puffs into the lungs every 6 (six) hours as needed for wheezing or shortness of breath. 08/03/20   [name], FNP  Ascorbic Acid (VITAMIN C) 100 MG tablet Take 100 mg by mouth daily.    [provider]  aspirin 81 MG chewable tablet Chew 1 tablet (81 mg total) by mouth daily. 05/03/22   Ghimire, Henreitta Leber, MD  atorvastatin (LIPITOR) 10 MG tablet TAKE ONE TABLET BY MOUTH ONCE DAILY 05/13/22   Cantwell, Celeste C, PA-C  AURYXIA 1 GM 210 MG(Fe) tablet Take 420-840 mg by mouth See admin instructions. Take '840mg'$  by mouth three times daily with meals and '420mg'$  with snacks. 05/15/21   [provider]  Biotin 10000 MCG TABS Take 10,000 mcg by mouth daily.     [provider]  cyclobenzaprine (FLEXERIL) 10 MG tablet Take 1 tablet (10 mg total) by mouth 3 (three) times daily as needed for muscle spasms. 11/15/21   [name], [place]  ELIQUIS 2.5 MG TABS tablet TAKE ONE TABLET BY MOUTH TWICE DAILY Patient taking differently: Take 2.5 mg by mouth 2 (two) times daily. 04/15/22   [name], FNP  gabapentin (NEURONTIN) 300 MG capsule Take 1 capsule by mouth daily. 05/06/22   [provider]  meclizine (ANTIVERT) 12.5 MG tablet Take 1 tablet (12.5 mg total) by mouth 3 (three) times daily as needed for dizziness. 04/12/21   Glendale Chard, MD  metoprolol succinate (TOPROL-XL) 25 MG 24 hr tablet Take 1 tablet (25 mg total) by mouth daily. 05/03/22   Ghimire, Henreitta Leber, MD  midodrine (PROAMATINE) 10 MG tablet Take 5 mg by mouth  every Monday, Wednesday, and Friday with hemodialysis. 04/17/22   [provider]  multivitamin (RENA-VIT) TABS tablet Take 1 tablet by mouth daily. 04/01/22   [provider]  omeprazole (PRILOSEC) 20 MG capsule TAKE ONE CAPSULE BY MOUTH ONCE DAILY Patient taking differently: Take 20 mg by mouth daily. 04/26/22   Truitt Merle, MD  oxyCODONE-acetaminophen (PERCOCET) 5-325 MG tablet Take 1 tablet by mouth every 4 (four) hours as needed for severe pain. 08/09/21   [name], FNP  vitamin E 200 UNIT capsule Take 200 Units by mouth daily.    [provider]      Allergies    Carnosine, Gadolinium derivatives, Iohexol, Iodinated contrast media, Iodine, and Naltrexone    Review of Systems   Review of Systems 10 systems reviewed negative except as per HPI Physical Exam Updated Vital Signs BP 104/65   Pulse 92   Temp 98.1 F (36.7 C) (Oral)   Resp 16   SpO2 98%  Physical Exam Constitutional:      Comments: Patient is alert.  No acute distress.  Very mild increased work of breathing.  Central obesity.  HENT:     Head: Normocephalic and atraumatic.     Mouth/Throat:     Pharynx: Oropharynx is clear.  Eyes:     Extraocular Movements: Extraocular movements intact.  Cardiovascular:     Rate and Rhythm: Normal rate and regular rhythm.  Pulmonary:     Comments: Mild tachypnea.  Occasional expiratory wheeze.  Some fine crackles at the bases good airflow bilaterally.  Chest has hemodialysis catheter left anterior.  Site is clean and dry.  No tenderness. Abdominal:     General: There is no distension.     Palpations: Abdomen is soft.     Tenderness: There is no abdominal tenderness. There is no guarding.  Musculoskeletal:     Comments: No peripheral edema.  Calves are soft and nontender.  Condition of the lower legs and feet is good without wounds.  Skin:    General: Skin is warm and dry.  Neurological:     General: No focal deficit present.     Mental Status: She is  oriented to person, place, and time.     Cranial Nerves: No cranial nerve deficit.     Motor: No weakness.     Coordination: Coordination normal.    ED Results / Procedures / Treatments   Labs (all labs ordered are listed, but only abnormal results are displayed) Labs Reviewed  BASIC METABOLIC PANEL - Abnormal; Notable for the following components:      Result Value   Sodium 134 (*)    CO2 20 (*)    Glucose, Bld 122 (*)    BUN 41 (*)    Creatinine, Ser 8.21 (*)    Calcium 8.8 (*)    GFR, Estimated  5 (*)    All other components within normal limits  CBC - Abnormal; Notable for the following components:   WBC 25.1 (*)    RBC 3.51 (*)    Hemoglobin 10.9 (*)    HCT 33.4 (*)    RDW 15.9 (*)    All other components within normal limits  PROTIME-INR - Abnormal; Notable for the following components:   Prothrombin Time 15.5 (*)    All other components within normal limits  BLOOD GAS, VENOUS - Abnormal; Notable for the following components:   pO2, Ven <31 (*)    Acid-base deficit 4.7 (*)    All other components within normal limits  HEPATIC FUNCTION PANEL - Abnormal; Notable for the following components:   Albumin 2.5 (*)    All other components within normal limits  CBG MONITORING, ED - Abnormal; Notable for the following components:   Glucose-Capillary 110 (*)    All other components within normal limits  TROPONIN I (HIGH SENSITIVITY) - Abnormal; Notable for the following components:   Troponin I (High Sensitivity) 38 (*)    All other components within normal limits  RESP PANEL BY RT-PCR (FLU A&B, COVID) ARPGX2  CULTURE, BLOOD (ROUTINE X 2)  CULTURE, BLOOD (ROUTINE X 2)  URINALYSIS, ROUTINE W REFLEX MICROSCOPIC  TROPONIN I (HIGH SENSITIVITY)    EKG None  Radiology CT Head Wo Contrast  Result Date: 05/16/2022 CLINICAL DATA:  Weakness. EXAM: CT HEAD WITHOUT CONTRAST TECHNIQUE: Contiguous axial images were obtained from the base of the skull through the vertex without  intravenous contrast. RADIATION DOSE REDUCTION: This exam was performed according to the departmental dose-optimization program which includes automated exposure control, adjustment of the mA and/or kV according to patient size and/or use of iterative reconstruction technique. COMPARISON:  March 09, 2014. FINDINGS: Brain: No evidence of acute infarction, hemorrhage, hydrocephalus, extra-axial collection or mass lesion/mass effect. Vascular: No hyperdense vessel or unexpected calcification. Skull: Normal. Negative for fracture or focal lesion. Sinuses/Orbits: No acute finding. Other: None. IMPRESSION: No acute intracranial abnormality seen. Electronically Signed   By: Marijo Conception M.D.   On: 05/16/2022 16:01   DG Chest Port 1 View  Result Date: 05/16/2022 CLINICAL DATA:  Short of breath,  end-stage renal disease EXAM: PORTABLE CHEST 1 VIEW COMPARISON:  None Available. FINDINGS: LEFT-sided central venous line tip in the distal SVC. Stable cardiac silhouette. Mild central venous congestion pattern. Low lung volumes. IMPRESSION: Central venous congestion.  No overt pulmonary edema. Electronically Signed   By: Suzy Bouchard M.D.   On: 05/16/2022 14:58    Procedures Procedures   CRITICAL CARE Performed by: Charlesetta Shanks   Total critical care time: 30 minutes  Critical care time was exclusive of separately billable procedures and treating other patients.  Critical care was necessary to treat or prevent imminent or life-threatening deterioration.  Critical care was time spent personally by me on the following activities: development of treatment plan with patient and/or surrogate as well as nursing, discussions with consultants, evaluation of patient's response to treatment, examination of patient, obtaining history from patient or surrogate, ordering and performing treatments and interventions, ordering and review of laboratory studies, ordering and review of radiographic studies, pulse oximetry  and re-evaluation of patient's condition.  Medications Ordered in ED Medications  ipratropium-albuterol (DUONEB) 0.5-2.5 (3) MG/3ML nebulizer solution 3 mL (3 mLs Nebulization Given 05/16/22 1438)    ED Course/ Medical Decision Making/ A&P  Medical Decision Making Amount and/or Complexity of Data Reviewed Labs: ordered. Radiology: ordered.  Risk Prescription drug management. Decision regarding hospitalization.   Patient has multiple and severe comorbid conditions.  Differential diagnosis for weakness is broad.  Patient will need evaluation for infectious etiology\metabolic derangement\stroke\hypovolemia\overmedication.  At this time her mental status is clear.  Patient does not show signs of respiratory depression.  Patient does have soft blood pressures but does not show evidence of hypoperfusion based on mental status and clinical exam.  Will continue work-up and address blood pressure as needed.  Patient was dialyzed yesterday.  Hypotension may be related to medications.  Patient does well have history of COPD.  She has mild increased work of breathing.  There are occasional wheeze.  Will administer a DuoNeb.  [name] blood pressures have improved.  She is now at 104/65.  This is without specific intervention.  Patient does have significant leukocytosis.  Conderation given for early sepsis.  [name] mental status and respiratory status are stable.  We will also obtain blood cultures.  No evident source however patient does have a recent dialysis catheter that was changed during last hospitalization.  Catheter infection to be considered but site looks good and is nontender.  We will start empiric antibiotics to cover for leukocytosis 25,000+ hypotension.  Clinically patient stable for admission.  She is not requiring additional airway support.  Her mental status remains alert and appropriate.  Consult: Reviewed with Dr. Flossie Buffy for admission        Final  Clinical Impression(s) / ED Diagnoses Final diagnoses:  General weakness  Severe comorbid illness    Rx / DC Orders ED Discharge Orders     None         Charlesetta Shanks, MD 05/17/22 0830

## 2022-05-16 NOTE — Assessment & Plan Note (Signed)
-   Patient with known chronic left hip pain however patient reports pain is acutely worse than baseline -Recent hip x-ray reviewed, unremarkable for acute changes -Given acutely worsened hip pain as well as new leukocytosis, will request MRI of left hip to rule out septic joint

## 2022-05-16 NOTE — Assessment & Plan Note (Signed)
-   Recommend diet/lifestyle modification

## 2022-05-16 NOTE — Assessment & Plan Note (Signed)
-   Presented hypotensive, thus will hold home blood pressure medications at this time -We will continue home midodrine for dialysis -At present, blood pressure seems improved with systolic blood pressure over 100

## 2022-05-16 NOTE — ED Notes (Signed)
[facility] called. Pt on transfer list

## 2022-05-16 NOTE — Progress Notes (Signed)
[place]   [phone] [phone]   Clinic Follow up Note   Patient Care Team: [name], Holt as PCP - General (General Practice) Waynetta Sandy, MD as Consulting Physician (Vascular Surgery) [facility], [facility] as Referring Physician (Nephrology) Edrick Oh, MD as Consulting Physician (Nephrology) Truitt Merle, MD as Consulting Physician (Hematology) [facility], [place], RN as [facility] [employer] C[place], Kennieth Francois, [place] (Inactive) (Pharmacist) [name], MD as Consulting Physician (Cardiology)  Date of Service:  05/16/2022  CHIEF COMPLAINT: f/u of anemia, h/o thrombosis  CURRENT THERAPY:  Eliquis 2.'5mg'$  bid (dose per renal function)  ASSESSMENT & PLAN:  Whitney Walker is a 66 y.o. female with   1. New Hypotension, Chills, Malaise -She came in today for routine follow-up.  She was found to be hypotensive at 80/62. She reports dizziness and generalized malaise today -she also notes new chills that began last night. Daughter reports she fell at home this morning.  -I will refer her to the ED and try to get her in quickly.  2. Recurrent DVT and PE -She previously had episodes of PE, was treated with Xarelto. 10 months after she came off Xarelto, she developed unprovoked DVT and PE. -I previously recommended her to continue anticoagulation indefinitely, if no contraindications. -Patient was previously on Coumadin, but switched to Eliquis due to her transportation issues with Coumadin check up. She takes Eliquis 2.'5mg'$  BID, dose adjusted based on her ESRD on HD. -She previously stated she has been given high dose hepatin during dialysis due to clotting issue, which is fine.  -Continue Eliquis 2.'5mg'$  BID and continue to watch for blood clot.    3. Anemia of chronic disease and anemia of iron deficiency -likely related to her chronic kidney  disease  -She takes oral iron supplement and IV iron as needed which is managed by her nephrologist at dialysis center.  -Stable, recent Hg 11.4 on 05/03/22   4. H/o colon cancer in 2009  -She has completed 5 years of surveillance.  -last coloscopy done by Dr. Collene Mares within the last 5 years according to patient. Before that was in 05/2012. -She notes she is due for her next screening colonoscopy in 2023.   5. ESRD on HD -She underwent fistula surgery with Dr. Donzetta Matters on 05/05/18 and she also started dialysis in 04/2018, currently [facility]. She is able to make small urine output.  -Labs reviewed, creatinine 10.62 on 05/03/22, which is to be expected post dialysis.   6. OSA, PVD, CHF, acid reflux -She is s/p heart cath 05/01/21 and again 05/02/22 for atypical chest pain -F/u with PCP and other specialists.   7. Cancer screening -lung cancer screening CT on 12/12/20 was benign -routine mammogram on 10/02/21 was negative. -she is following up with GI and knows she is due for repeat colonoscopy in 2023.   8. Anxiety and depression   -She is being followed by a Dr. Ouida Sills for counseling. Mood is stable.      Plan -Due to her hypotension, chills, malaise, this is concerning for sepsis.  I will send her to [facility] ED. I spoke with ED charge nurse and we wheeled her to ED -lab and f/u in one year for routine f/u    No problem-specific Assessment & Plan notes found for this encounter.   INTERVAL HISTORY:  Whitney Walker is here for a follow up of anemia. She was last seen by  me one year ago. She presents to the clinic accompanied by her daughter. Her BP is low today. She reports she is dizzy. She also reports new chills and general fatigue/malaise. She fell earlier today. Her daughter expressed concern that pt took too much pain medicine this morning. Pt endorses taking oxycodone yesterday and tylenol this morning.   All other systems were reviewed with the patient and are negative.  MEDICAL HISTORY:   Past Medical History:  Diagnosis Date   Anemia    Anxiety    CHF (congestive heart failure) ([place])    Colon cancer ([place])    treatment surgery   Complication of anesthesia    after first C- [place] "couldnt walk after", patient denies having a spinal   COPD (chronic obstructive pulmonary disease) ([place])    Coronary artery disease    Depression    DVT (deep venous thrombosis) ([place])    ESRD on hemodialysis ([place])    Hemo: MWF   History of blood transfusion 04/2018   Hypertension    07/07/18- no longer takes BP medications   Meningitis    Pain in limb 07/30/2013   PE (pulmonary embolism)    Peripheral vascular disease (H[facility])    Restless legs    Shortness of breath    with exertion   Sleep apnea    SOB (shortness of breath) 03/03/2019   Vertigo     SURGICAL HISTORY: Past Surgical History:  Procedure Laterality Date   ABDOMINAL AORTAGRAM N/A 07/26/2013   Procedure: ABDOMINAL Maxcine Ham;  Surgeon: [name] [place], MD;  Location: [facility] CATH LAB;  Service: Cardiovascular;  Laterality: N/A;   ABDOMINAL HYSTERECTOMY     AV FISTULA PLACEMENT Left 05/05/2018   Procedure: ARTERIOVENOUS (AV) FISTULA CREATION BRACHIOCEPHALIC;  Surgeon: Waynetta Sandy, MD;  Location: [facility];  Service: Vascular;  Laterality: Left;   AV FISTULA PLACEMENT Left 07/16/2018   Procedure: ARTERIOVENOUS FISTULA CREATION;  Surgeon: Waynetta Sandy, MD;  Location: [facility];  Service: Vascular;  Laterality: Left;   [place] Left 09/03/2018   Procedure: BASILIC VEIN TRANSPOSITION SECOND STAGE;  Surgeon: Waynetta Sandy, MD;  Location: [facility];  Service: Vascular;  Laterality: Left;   BREAST BIOPSY Left    CESAREAN SECTION     X 3 1974-1977   CHOLECYSTECTOMY     CHOLECYSTECTOMY  1980/s   COLECTOMY  2010   DIVERTICULOSIS SURGERY-2002  2012   FEMORAL-POPLITEAL BYPASS GRAFT Left 08/13/2013   Procedure: BYPASS GRAFT FEMORAL-POPLITEAL ARTERY WITH NON-REVERSED SAPHANEOUS VEIN; ULTRASOUND  GUIDED;  Surgeon: Mal [name], MD;  Location: [facility];  Service: Vascular;  Laterality: Left;   INSERTION OF DIALYSIS CATHETER Right 04/30/2018   Procedure: INSERTION OF Right Internal Jugular DIALYSIS CATHETER;  Surgeon: [name], MD;  Location: [facility] OR;  Service: Vascular;  Laterality: Right;   IR FLUORO GUIDE CV LINE LEFT  05/01/2022   IR FLUORO GUIDE CV LINE RIGHT  07/24/2020   IR FLUORO GUIDE CV LINE RIGHT  07/21/2021   IR REMOVAL TUN CV CATH W/O FL  07/21/2020   IR REMOVAL TUN CV CATH W/O FL  07/19/2021   IR US GUIDE VASC ACCESS RIGHT  07/24/2020   IR US GUIDE VASC ACCESS RIGHT  07/21/2021   LEFT HEART CATH AND CORONARY ANGIOGRAPHY N/A 05/01/2021   Procedure: LEFT HEART CATH AND CORONARY ANGIOGRAPHY;  Surgeon: [name], MD;  Location: [facility] CV LAB;  Service: Cardiovascular;  Laterality: N/A;   LEFT HEART CATH AND CORONARY ANGIOGRAPHY  N/A 05/02/2022   Procedure: LEFT HEART CATH AND CORONARY ANGIOGRAPHY;  Surgeon: [name], MD;  Location: [facility] CV LAB;  Service: Cardiovascular;  Laterality: N/A;   LOWER EXTREMITY ANGIOGRAM Left 07/26/2013   Procedure: LOWER EXTREMITY ANGIOGRAM;  Surgeon: [name] [place], MD;  Location: [facility] CATH LAB;  Service: Cardiovascular;  Laterality: Left;    I have reviewed the social history and family history with the patient and they are unchanged from previous note.  ALLERGIES:  is allergic to carnosine, gadolinium derivatives, iohexol, iodinated contrast media, iodine, and naltrexone.  MEDICATIONS:  No current facility-administered medications for this visit.   Current Outpatient Medications  Medication Sig Dispense Refill   acetaminophen (TYLENOL) 650 MG CR tablet Take 1,950-2,600 mg by mouth 2 (two) times daily as needed for pain. Do not exceed more than '4000mg'$  (or 6 capsules) in 24hrs.     albuterol (VENTOLIN HFA) 108 (90 Base) MCG/ACT inhaler Inhale 2 puffs into the lungs every 6 (six) hours as needed for wheezing or shortness of breath. 18 g 0    Ascorbic Acid (VITAMIN C) 100 MG tablet Take 100 mg by mouth daily.     aspirin 81 MG chewable tablet Chew 1 tablet (81 mg total) by mouth daily. 30 tablet 0   atorvastatin (LIPITOR) 10 MG tablet TAKE ONE TABLET BY MOUTH ONCE DAILY (Patient taking differently: Take 10 mg by mouth every evening.) 30 tablet 3   AURYXIA 1 GM 210 MG(Fe) tablet Take 420-840 mg by mouth See admin instructions. Take '840mg'$  by mouth three times daily with meals and '420mg'$  with snacks.     Biotin 10000 MCG TABS Take 10,000 mcg by mouth daily.      cyclobenzaprine (FLEXERIL) 10 MG tablet Take 1 tablet (10 mg total) by mouth 3 (three) times daily as needed for muscle spasms. (Patient not taking: Reported on 05/16/2022) 30 tablet 1   ELIQUIS 2.5 MG TABS tablet TAKE ONE TABLET BY MOUTH TWICE DAILY (Patient taking differently: Take 2.5 mg by mouth 2 (two) times daily.) 60 tablet 2   gabapentin (NEURONTIN) 300 MG capsule Take 300 mg by mouth daily.     meclizine (ANTIVERT) 12.5 MG tablet Take 1 tablet (12.5 mg total) by mouth 3 (three) times daily as needed for dizziness. 30 tablet 0   metoprolol succinate (TOPROL-XL) 25 MG 24 hr tablet Take 1 tablet (25 mg total) by mouth daily. 30 tablet 1   midodrine (PROAMATINE) 10 MG tablet Take 5 mg by mouth every Monday, Wednesday, and Friday with hemodialysis.     multivitamin (RENA-VIT) TABS tablet Take 1 tablet by mouth every evening.     omeprazole (PRILOSEC) 20 MG capsule TAKE ONE CAPSULE BY MOUTH ONCE DAILY (Patient taking differently: Take 20 mg by mouth daily.) 30 capsule 0   oxyCODONE-acetaminophen (PERCOCET) 5-325 MG tablet Take 1 tablet by mouth every 4 (four) hours as needed for severe pain. 30 tablet 0   vitamin E 200 UNIT capsule Take 200 Units by mouth daily.     Facility-Administered Medications Ordered in Other Visits  Medication Dose Route Frequency Provider Last Rate Last Admin   acetaminophen (TYLENOL) tablet 650 mg  650 mg Oral Q6H PRN Donne Hazel, MD       Or    acetaminophen (TYLENOL) suppository 650 mg  650 mg Rectal Q6H PRN Donne Hazel, MD       albuterol (PROVENTIL) (2.5 MG/3ML) 0.083% nebulizer solution 3 mL  3 mL Inhalation Q6H PRN Marylu Lund  K, MD       apixaban (ELIQUIS) tablet 2.5 mg  2.5 mg Oral BID Donne Hazel, MD       aspirin chewable tablet 81 mg  81 mg Oral Daily Donne Hazel, MD       atorvastatin (LIPITOR) tablet 10 mg  10 mg Oral QPM Donne Hazel, MD       ferric citrate Lorin Picket) tablet 420-840 mg  420-840 mg Oral See admin instructions Donne Hazel, MD       gabapentin (NEURONTIN) capsule 300 mg  300 mg Oral Daily Donne Hazel, MD       [START ON 05/17/2022] midodrine (PROAMATINE) tablet 5 mg  5 mg Oral Q M,W,F-HD Donne Hazel, MD       oxyCODONE (Oxy IR/ROXICODONE) immediate release tablet 5 mg  5 mg Oral Q4H PRN Donne Hazel, MD        PHYSICAL EXAMINATION: ECOG PERFORMANCE STATUS: 3 - Symptomatic, >50% confined to bed  Vitals:   05/16/22 1305  BP: (!) 80/62  Pulse: (!) 109  Resp: 20  Temp: 98.2 F (36.8 C)  SpO2: 94%   Wt Readings from Last 3 Encounters:  05/14/22 205 lb 6.4 oz (93.2 kg)  05/03/22 207 lb 7.3 oz (94.1 kg)  03/14/22 205 lb (93 kg)     GENERAL: (+) very fatigued, having difficulty sitting upright. SKIN: skin color normal, no rashes or significant lesions EYES: normal, Conjunctiva are pink and non-injected, sclera clear  NEURO: oriented x 3 with fluent speech  LABORATORY DATA:  I have reviewed the data as listed    Latest Ref Rng & Units 05/16/2022    2:07 PM 05/03/2022    1:18 AM 05/01/2022   10:19 PM  CBC  WBC 4.0 - 10.5 K/uL 25.1   9.1     Hemoglobin 12.0 - 15.0 g/dL 10.9   11.4   12.6    Hematocrit 36.0 - 46.0 % 33.4   36.6   37.0    Platelets 150 - 400 K/uL 269   139           Latest Ref Rng & Units 05/16/2022    2:07 PM 05/03/2022    1:18 AM 05/01/2022   10:19 PM  CMP  Glucose 70 - 99 mg/dL 122   219   60    BUN 8 - 23 mg/dL 41   41   27    Creatinine 0.44 -  1.00 mg/dL 8.21   10.62   7.90    Sodium 135 - 145 mmol/L 134   132   136    Potassium 3.5 - 5.1 mmol/L 5.1   5.7   3.8    Chloride 98 - 111 mmol/L 101   99   101    CO2 22 - 32 mmol/L 20   20     Calcium 8.9 - 10.3 mg/dL 8.8   7.9     Total Protein 6.5 - 8.1 g/dL 7.4      Total Bilirubin 0.3 - 1.2 mg/dL 0.6      Alkaline Phos 38 - 126 U/L 109      AST 15 - 41 U/L 21      ALT 0 - 44 U/L 16          RADIOGRAPHIC STUDIES: I have personally reviewed the radiological images as listed and agreed with the findings in the report. CT Head Wo Contrast  Result Date: 05/16/2022 CLINICAL DATA:  Weakness. EXAM: CT HEAD WITHOUT CONTRAST TECHNIQUE: Contiguous axial images were obtained from the base of the skull through the vertex without intravenous contrast. RADIATION DOSE REDUCTION: This exam was performed according to the departmental dose-optimization program which includes automated exposure control, adjustment of the mA and/or kV according to patient size and/or use of iterative reconstruction technique. COMPARISON:  March 09, 2014. FINDINGS: Brain: No evidence of acute infarction, hemorrhage, hydrocephalus, extra-axial collection or mass lesion/mass effect. Vascular: No hyperdense vessel or unexpected calcification. Skull: Normal. Negative for fracture or focal lesion. Sinuses/Orbits: No acute finding. Other: None. IMPRESSION: No acute intracranial abnormality seen. Electronically Signed   By: Marijo Conception M.D.   On: 05/16/2022 16:01   DG Chest Port 1 View  Result Date: 05/16/2022 CLINICAL DATA:  Short of breath,  end-stage renal disease EXAM: PORTABLE CHEST 1 VIEW COMPARISON:  None Available. FINDINGS: LEFT-sided central venous line tip in the distal SVC. Stable cardiac silhouette. Mild central venous congestion pattern. Low lung volumes. IMPRESSION: Central venous congestion.  No overt pulmonary edema. Electronically Signed   By: Suzy Bouchard M.D.   On: 05/16/2022 14:58      No orders of  the defined types were placed in this encounter.  All questions were answered. The patient knows to call the clinic with any problems, questions or concerns. No barriers to learning was detected. The total time spent in the appointment was 30 minutes.     Truitt Merle, MD 05/16/2022   I, Wilburn Mylar, am acting as scribe for Truitt Merle, MD.   I have reviewed the above documentation for accuracy and completeness, and I agree with the above.

## 2022-05-16 NOTE — Assessment & Plan Note (Addendum)
-   Tobacco cessation done at bedside -Declined nicotine patch at this time

## 2022-05-16 NOTE — ED Triage Notes (Signed)
Pt comes from [facility] w/ c/o hypotension. BP 86/52. Pt reports weakness that began a few days ago. Pt also reports weakness on R side. Neuro grips uneven. Sensation uneven.

## 2022-05-16 NOTE — Assessment & Plan Note (Signed)
-   On minimal O2 support at this time -We will continue Eliquis per home regimen, no evidence of acute bleed

## 2022-05-16 NOTE — H&P (Signed)
History and Physical    Patient: Whitney Walker WUJ:[id number] DOB: May 02, 1956 DOA: 05/16/2022 DOS: the patient was seen and examined on 05/16/2022 PCP: [name], [place]  Patient coming from: Home  Chief Complaint:  Chief Complaint  Patient presents with   Weakness   Hypotension   HPI: Whitney Walker is a 66 y.o. female with medical history significant of end-stage renal disease on Monday Wednesday Friday hemodialysis, COPD, CAD, DVT on chronic anticoagulation, hypertension, osteoarthritis with chronic left hip pain, depression, history of PE.  Patient was recently discharged on 05/03/2022 after being treated for a malfunctioning dialysis catheter during which time patient had dialysis catheter replaced per IR.  During admission, patient did have acute chest pain, found to have NSTEMI due to small vessel disease.    Patient reported being in her usual state of health until the morning of hospital presentation.  Was waking up on the morning of ED visit feeling very weak and "lightheaded."  Patient stumbled to the door to let her daughter and at which point she fell without losing consciousness.  Patient also reported feeling chills that began after dialysis session on 05/15/2022.  Patient remained very weak and later started feeling short of breath.  Patient presented to her scheduled oncology visit at which point patient was found to be hypotensive with systolic blood pressure in the 80s.  Patient was referred to the emergency department.  Emergency department, patient was noted to have a initial blood pressure of 91/54.  Blood pressure subsequently improved to 104/65.  Work-up was notable for a new white blood count in excess of 25,000.  Patient otherwise afebrile.  Blood cultures were obtained in the emergency department and empiric antibiotics were started.  Of note, patient reported having 2 days history of diarrhea after eating hibachi on 05/12/2022.  Other family members had similar  symptoms.  GI symptoms have since resolved.  Patient also reports long history of chronic left hip pain for which she is followed by orthopedics surgery.  Patient is status post left hip x-ray which was found to be remarkable only for arthritic changes.  Patient otherwise denies fevers.  Does endorse chills and generalized weakness.  Review of Systems: As mentioned in the history of present illness. All other systems reviewed and are negative. Past Medical History:  Diagnosis Date   Anemia    Anxiety    CHF (congestive heart failure) ([place])    Colon cancer ([place])    treatment surgery   Complication of anesthesia    after first C- [place] "couldnt walk after", patient denies having a spinal   COPD (chronic obstructive pulmonary disease) ([place])    Coronary artery disease    Depression    DVT (deep venous thrombosis) (H[facility])    ESRD on hemodialysis ([place])    Hemo: MWF   History of blood transfusion 04/2018   Hypertension    07/07/18- no longer takes BP medications   Meningitis    Pain in limb 07/30/2013   PE (pulmonary embolism)    Peripheral vascular disease ([facility])    Restless legs    Shortness of breath    with exertion   Sleep apnea    SOB (shortness of breath) 03/03/2019   Vertigo    Past Surgical History:  Procedure Laterality Date   ABDOMINAL AORTAGRAM N/A 07/26/2013   Procedure: ABDOMINAL Maxcine Ham;  Surgeon: [name] Ridley Park, MD;  Location: [facility] CATH LAB;  Service: Cardiovascular;  Laterality: N/A;   ABDOMINAL HYSTERECTOMY  AV FISTULA PLACEMENT Left 05/05/2018   Procedure: ARTERIOVENOUS (AV) FISTULA CREATION BRACHIOCEPHALIC;  Surgeon: Waynetta Sandy, MD;  Location: [place];  Service: Vascular;  Laterality: Left;   AV FISTULA PLACEMENT Left 07/16/2018   Procedure: ARTERIOVENOUS FISTULA CREATION;  Surgeon: Waynetta Sandy, MD;  Location: [place];  Service: Vascular;  Laterality: Left;   [place] Left 09/03/2018   Procedure: BASILIC VEIN TRANSPOSITION  SECOND STAGE;  Surgeon: Waynetta Sandy, MD;  Location: [place];  Service: Vascular;  Laterality: Left;   BREAST BIOPSY Left    CESAREAN SECTION     X 3 1974-1977   CHOLECYSTECTOMY     CHOLECYSTECTOMY  1980/s   COLECTOMY  2010   DIVERTICULOSIS SURGERY-2002  2012   FEMORAL-POPLITEAL BYPASS GRAFT Left 08/13/2013   Procedure: BYPASS GRAFT FEMORAL-POPLITEAL ARTERY WITH NON-REVERSED SAPHANEOUS VEIN; ULTRASOUND GUIDED;  Surgeon: Mal [name], MD;  Location: [place];  Service: Vascular;  Laterality: Left;   INSERTION OF DIALYSIS CATHETER Right 04/30/2018   Procedure: INSERTION OF Right Internal Jugular DIALYSIS CATHETER;  Surgeon: [name], MD;  Location: [facility] OR;  Service: Vascular;  Laterality: Right;   IR FLUORO GUIDE CV LINE LEFT  05/01/2022   IR FLUORO GUIDE CV LINE RIGHT  07/24/2020   IR FLUORO GUIDE CV LINE RIGHT  07/21/2021   IR REMOVAL TUN CV CATH W/O FL  07/21/2020   IR REMOVAL TUN CV CATH W/O FL  07/19/2021   IR US GUIDE VASC ACCESS RIGHT  07/24/2020   IR US GUIDE VASC ACCESS RIGHT  07/21/2021   LEFT HEART CATH AND CORONARY ANGIOGRAPHY N/A 05/01/2021   Procedure: LEFT HEART CATH AND CORONARY ANGIOGRAPHY;  Surgeon: [name], MD;  Location: [place] CV LAB;  Service: Cardiovascular;  Laterality: N/A;   LEFT HEART CATH AND CORONARY ANGIOGRAPHY N/A 05/02/2022   Procedure: LEFT HEART CATH AND CORONARY ANGIOGRAPHY;  Surgeon: [name], MD;  Location: [place] CV LAB;  Service: Cardiovascular;  Laterality: N/A;   LOWER EXTREMITY ANGIOGRAM Left 07/26/2013   Procedure: LOWER EXTREMITY ANGIOGRAM;  Surgeon: [name] Cohassett Beach, MD;  [facility] C[facility] LAB;  Service: Cardiovascular;  Laterality: Left;   Social History:  reports that she has been smoking cigarettes. She has a 25.00 pack-year smoking history. She has quit using smokeless tobacco. She reports that she does not currently use drugs after having used the following drugs: Oxycodone. She reports that she does not drink alcohol.  Allergies   Allergen Reactions   Carnosine     Other reaction(s): Unknown   Gadolinium Derivatives Hives and Other (See Comments)    HIVES, Desc: HIVES W/ "DYE" USED FOR 1ST CT SCAN BUT NOT 2ND, NO PREMEDS USED, PT UNCERTAIN OF CIRCUMSTANCES,,?POSSIBLE MRI CONTRAST ALLERGY, ALL STUDIES DONE "SOMEWHERE" IN [place]//A.C., Onset Date: 70350093   Iohexol Hives and Other (See Comments)    Desc: HIVES W/ "DYE" USED FOR 1ST CT SCAN BUT NOT 2ND, NO PREMEDS USED, PT UNCERTAIN OF CIRCUMSTANCES,,?POSSIBLE MRI CONTRAST ALLERGY, ALL STUDIES DONE "SOMEWHERE" IN [place]//A.C., Onset Date: 81829937    Iodinated Contrast Media Hives   Iodine Hives   Naltrexone Other (See Comments)    Unknown    Family History  Problem Relation Age of Onset   Cancer Mother 62       breast and bone   Cancer Father 20       prostate   Hypertension Sister    Bleeding Disorder Sister    Cancer Cousin 26  breast cancer    Hypertension Daughter    Breast cancer Neg Hx     Prior to Admission medications   Medication Sig Start Date End Date Taking? Authorizing Provider  acetaminophen (TYLENOL) 650 MG CR tablet Take 1,950-2,600 mg by mouth 2 (two) times daily as needed for pain. Do not exceed more than '4000mg'$  (or 6 capsules) in 24hrs.   Yes [provider]  albuterol (VENTOLIN HFA) 108 (90 Base) MCG/ACT inhaler Inhale 2 puffs into the lungs every 6 (six) hours as needed for wheezing or shortness of breath. 08/03/20  Yes [name], FNP  Ascorbic Acid (VITAMIN C) 100 MG tablet Take 100 mg by mouth daily.   Yes [provider]  aspirin 81 MG chewable tablet Chew 1 tablet (81 mg total) by mouth daily. 05/03/22  Yes Ghimire, Henreitta Leber, MD  atorvastatin (LIPITOR) 10 MG tablet TAKE ONE TABLET BY MOUTH ONCE DAILY Patient taking differently: Take 10 mg by mouth every evening. 05/13/22  Yes Cantwell, Celeste C, PA-C  AURYXIA 1 GM 210 MG(Fe) tablet Take 420-840 mg by mouth See admin instructions. Take '840mg'$  by  mouth three times daily with meals and '420mg'$  with snacks. 05/15/21  Yes [provider]  Biotin 10000 MCG TABS Take 10,000 mcg by mouth daily.    Yes [provider]  ELIQUIS 2.5 MG TABS tablet TAKE ONE TABLET BY MOUTH TWICE DAILY Patient taking differently: Take 2.5 mg by mouth 2 (two) times daily. 04/15/22  Yes [name], FNP  gabapentin (NEURONTIN) 300 MG capsule Take 300 mg by mouth daily. 05/06/22  Yes [provider]  meclizine (ANTIVERT) 12.5 MG tablet Take 1 tablet (12.5 mg total) by mouth 3 (three) times daily as needed for dizziness. 04/12/21  Yes [place] Chard, MD  metoprolol succinate (TOPROL-XL) 25 MG 24 hr tablet Take 1 tablet (25 mg total) by mouth daily. 05/03/22  Yes Ghimire, Henreitta Leber, MD  midodrine (PROAMATINE) 10 MG tablet Take 5 mg by mouth every Monday, Wednesday, and Friday with hemodialysis. 04/17/22  Yes [provider]  multivitamin (RENA-VIT) TABS tablet Take 1 tablet by mouth every evening. 04/01/22  Yes [provider]  omeprazole (PRILOSEC) 20 MG capsule TAKE ONE CAPSULE BY MOUTH ONCE DAILY Patient taking differently: Take 20 mg by mouth daily. 04/26/22  Yes Truitt Merle, MD  oxyCODONE-acetaminophen (PERCOCET) 5-325 MG tablet Take 1 tablet by mouth every 4 (four) hours as needed for severe pain. 08/09/21  Yes [name], FNP  vitamin E 200 UNIT capsule Take 200 Units by mouth daily.   Yes [provider]  cyclobenzaprine (FLEXERIL) 10 MG tablet Take 1 tablet (10 mg total) by mouth 3 (three) times daily as needed for muscle spasms. Patient not taking: Reported on 05/16/2022 11/15/21   [name], FNP    Physical Exam: Vitals:   05/16/22 1445 05/16/22 1447 05/16/22 1500 05/16/22 1600  BP: (!) 87/52  (!) 91/54 104/65  Pulse: 89  91 92  Resp: '15  12 16  '$ Temp:  98.1 F (36.7 C)    TempSrc:  Oral    SpO2: 95%  91% 98%   General exam: Awake, laying in bed, in nad Respiratory system: Normal respiratory effort, no  wheezing Cardiovascular system: regular rate, s1, s2 Gastrointestinal system: Soft, nondistended, positive BS Central nervous system: CN2-12 grossly intact, strength intact Extremities: Perfused, no clubbing, left hip tender on palpation Skin: Normal skin turgor, no notable skin lesions seen Psychiatry: Mood normal // no visual hallucinations  Data Reviewed:  Labs reviewed, WBC 25 , creatinine 8.21, potassium 5.1  Assessment and Plan: * Fall - Likely secondary to presenting hypotension, as well as possible acute infection in setting of known arthritic changes -Denies syncope -Consult PT/OT  Tobacco abuse - Tobacco cessation done at bedside -Declined nicotine patch at this time  Left hip pain - Patient with known chronic left hip pain however patient reports pain is acutely worse than baseline -Recent hip x-ray reviewed, unremarkable for acute changes -Given acutely worsened hip pain as well as new leukocytosis, will request MRI of left hip to rule out septic joint  History of pulmonary embolism - On minimal O2 support at this time -We will continue Eliquis per home regimen, no evidence of acute bleed   ESRD (end stage renal disease) ([place]) - Patient with recent replacement of dialysis catheter, noted to be functioning -Patient dialyzes Monday Wednesday Friday, recently completed treatment 05/15/2022. -Have consulted nephrology for dialysis while in hospital -Recheck basic metabolic panel in the morning  Morbid (severe) obesity due to excess calories ([place]) - Recommend diet/lifestyle modification  Depression - Appears stable at this time, patient with pleasant affect  Essential hypertension - Presented hypotensive, thus will hold home blood pressure medications at this time -We will continue home midodrine for dialysis -At present, blood pressure seems improved with systolic blood pressure over 322  Chronic diastolic heart failure ([place]) - Appears to be euvolemic at this  time -Continue volume management per dialysis  Leucocytosis - Blood cultures ordered, pending -MRI left hip ordered per above -Patient was given dose of empiric vancomycin and cefepime in the emergency department -At this time, patient appears nontoxic.  Hold further antibiotics for the time being unless patient becomes febrile or acutely worse -Pete CBC in the morning    Advance Care Planning:   Code Status: Full Code Full  Consults: Nephrology  Family Communication: Pt in room, family at bedside  Severity of Illness: The appropriate patient status for this patient is INPATIENT. Inpatient status is judged to be reasonable and necessary in order to provide the required intensity of service to ensure the patient's safety. The patient's presenting symptoms, physical exam findings, and initial radiographic and laboratory data in the context of their chronic comorbidities is felt to place them at high risk for further clinical deterioration. Furthermore, it is not anticipated that the patient will be medically stable for discharge from the hospital within 2 midnights of admission.   * I certify that at the point of admission it is my clinical judgment that the patient will require inpatient hospital care spanning beyond 2 midnights from the point of admission due to high intensity of service, high risk for further deterioration and high frequency of surveillance required.*  Author: Marylu Lund, MD 05/16/2022 6:13 PM  For on call review [email or web address].

## 2022-05-16 NOTE — Assessment & Plan Note (Signed)
-   Appears stable at this time, patient with pleasant affect

## 2022-05-16 NOTE — Plan of Care (Signed)

## 2022-05-16 NOTE — Assessment & Plan Note (Signed)
-   Blood cultures ordered, pending -MRI left hip ordered per above -Patient was given dose of empiric vancomycin and cefepime in the emergency department -At this time, patient appears nontoxic.  Hold further antibiotics for the time being unless patient becomes febrile or acutely worse -Pete CBC in the morning

## 2022-05-16 NOTE — Assessment & Plan Note (Addendum)
-   Patient with recent replacement of dialysis catheter, noted to be functioning -Patient dialyzes Monday Wednesday Friday, recently completed treatment 05/15/2022. -Have consulted nephrology for dialysis while in hospital -Recheck basic metabolic panel in the morning

## 2022-05-16 NOTE — Assessment & Plan Note (Signed)
-   Likely secondary to presenting hypotension, as well as possible acute infection in setting of known arthritic changes -Denies syncope -Consult PT/OT

## 2022-05-17 ENCOUNTER — Inpatient Hospital Stay (HOSPITAL_COMMUNITY): Payer: Medicare Other

## 2022-05-17 DIAGNOSIS — W19XXXA Unspecified fall, initial encounter: Secondary | ICD-10-CM | POA: Diagnosis not present

## 2022-05-17 DIAGNOSIS — N186 End stage renal disease: Secondary | ICD-10-CM | POA: Diagnosis not present

## 2022-05-17 DIAGNOSIS — F32A Depression, unspecified: Secondary | ICD-10-CM

## 2022-05-17 DIAGNOSIS — M25552 Pain in left hip: Secondary | ICD-10-CM | POA: Diagnosis not present

## 2022-05-17 DIAGNOSIS — B9561 Methicillin susceptible Staphylococcus aureus infection as the cause of diseases classified elsewhere: Secondary | ICD-10-CM | POA: Diagnosis not present

## 2022-05-17 DIAGNOSIS — I5032 Chronic diastolic (congestive) heart failure: Secondary | ICD-10-CM | POA: Diagnosis not present

## 2022-05-17 DIAGNOSIS — Z992 Dependence on renal dialysis: Secondary | ICD-10-CM

## 2022-05-17 DIAGNOSIS — A411 Sepsis due to other specified staphylococcus: Secondary | ICD-10-CM

## 2022-05-17 HISTORY — PX: IR REMOVAL TUN CV CATH W/O FL: IMG2289

## 2022-05-17 LAB — COMPREHENSIVE METABOLIC PANEL
ALT: 14 U/L (ref 0–44)
AST: 25 U/L (ref 15–41)
Albumin: 2 g/dL — ABNORMAL LOW (ref 3.5–5.0)
Alkaline Phosphatase: 89 U/L (ref 38–126)
Anion gap: 15 (ref 5–15)
BUN: 47 mg/dL — ABNORMAL HIGH (ref 8–23)
CO2: 15 mmol/L — ABNORMAL LOW (ref 22–32)
Calcium: 8.6 mg/dL — ABNORMAL LOW (ref 8.9–10.3)
Chloride: 100 mmol/L (ref 98–111)
Creatinine, Ser: 9.04 mg/dL — ABNORMAL HIGH (ref 0.44–1.00)
GFR, Estimated: 4 mL/min — ABNORMAL LOW (ref >60)
Glucose, Bld: 111 mg/dL — ABNORMAL HIGH (ref 70–99)
Potassium: 5.4 mmol/L — ABNORMAL HIGH (ref 3.5–5.1)
Sodium: 130 mmol/L — ABNORMAL LOW (ref 135–145)
Total Bilirubin: 0.8 mg/dL (ref 0.3–1.2)
Total Protein: 6.6 g/dL (ref 6.5–8.1)

## 2022-05-17 LAB — CBC
HCT: 32.6 % — ABNORMAL LOW (ref 36.0–46.0)
Hemoglobin: 10.8 g/dL — ABNORMAL LOW (ref 12.0–15.0)
MCH: 30.5 pg (ref 26.0–34.0)
MCHC: 33.1 g/dL (ref 30.0–36.0)
MCV: 92.1 fL (ref 80.0–100.0)
Platelets: 192 10*3/uL (ref 150–400)
RBC: 3.54 MIL/uL — ABNORMAL LOW (ref 3.87–5.11)
RDW: 15.4 % (ref 11.5–15.5)
WBC: 22 10*3/uL — ABNORMAL HIGH (ref 4.0–10.5)
nRBC: 0 % (ref 0.0–0.2)

## 2022-05-17 LAB — BLOOD CULTURE ID PANEL (REFLEXED) - BCID2

## 2022-05-17 LAB — LACTIC ACID, PLASMA: Lactic Acid, Venous: 1.1 mmol/L (ref 0.5–1.9)

## 2022-05-17 MED ORDER — PANTOPRAZOLE SODIUM 40 MG PO TBEC
40.0000 mg | DELAYED_RELEASE_TABLET | Freq: Every day | ORAL | Status: DC
  Administered 2022-05-17 – 2022-05-22 (×6): 40 mg via ORAL
  Filled 2022-05-17 (×6): qty 1

## 2022-05-17 MED ORDER — HYDROMORPHONE HCL 1 MG/ML IJ SOLN
0.5000 mg | Freq: Once | INTRAMUSCULAR | Status: AC
  Administered 2022-05-17: 0.5 mg via INTRAVENOUS
  Filled 2022-05-17: qty 0.5

## 2022-05-17 MED ORDER — HEPARIN SODIUM (PORCINE) 1000 UNIT/ML IJ SOLN
INTRAMUSCULAR | Status: AC
  Filled 2022-05-17: qty 6

## 2022-05-17 MED ORDER — HEPARIN SODIUM (PORCINE) 1000 UNIT/ML IJ SOLN
INTRAMUSCULAR | Status: AC
  Filled 2022-05-17: qty 4

## 2022-05-17 MED ORDER — CHLORHEXIDINE GLUCONATE CLOTH 2 % EX PADS
6.0000 | MEDICATED_PAD | Freq: Every day | CUTANEOUS | Status: DC
  Administered 2022-05-17 – 2022-05-19 (×3): 6 via TOPICAL

## 2022-05-17 MED ORDER — CEFAZOLIN SODIUM-DEXTROSE 1-4 GM/50ML-% IV SOLN
1.0000 g | INTRAVENOUS | Status: DC
  Administered 2022-05-17 – 2022-05-22 (×5): 1 g via INTRAVENOUS
  Filled 2022-05-17 (×8): qty 50

## 2022-05-17 NOTE — Consult Note (Signed)
[facility]    Date of Admission:  05/16/2022     Reason for Consult: mssa bacteremia, esrd    Referring Provider: Melvenia Beam   Lines:  5/03-c left chest tunneled hd cath  Abx: 5/19-c cefazolin  5/18 vanc/cefepime        Assessment: Esrd on iHD via left chest HD cath that was just guidewired exchanged 5/03 for malfunctioning Acute on chronic left hip pain tender on exam si joint and mri with edema in sacral ala and iliacus concerning for involvement with mssa Pvd  Vte on eliquis Hx colon cancer  66 yo female pmh VTE on eliquis, hx colon cancer 2009 no evidence disease currently, chronic left hip pain, PVD s/p left fem-pop bypass distant past, failed LUE AVF, esrd on iHD, recent old HD catheter malfunction s/p guidewire exchange by IR on the left side, admitted 5/18 for sepsis follow dialysis on 5/17 found to have mssa bacteremia, in the setting of 1 day of weakness/lightheadedness and acute on chronic left lower back pain  5/17 bcx positive  Has lue old failed avf and no pain/swelling there. No other sign of metastatic focus of infection at this time  Will repeat bcx to document clearance. A tte will be needed. In setting of mssa bacteremia and acute on chronic left sacral ala edema potentially involved, will need to treat for 6 weeks. If repeat bcx continues to be positive beyond 3 days of initial presentation, would get tee and perhaps abd/pelv ct to r/o abscess  Source/seeding likely the tunneled catheter and that would need to be removed for a 3 day line holiday before placement. She mentioned  Plan: Repeat blood culture tomorrow Tte Transition abx to cefazolin Need HD catheter removal and 3 day line holiday prior to placing new catheter (with 3 day sepsis defervence/clearance of bacteremia) Discussed with primary team  I spent 80 minute reviewing data/chart, and coordinating care and >50% direct face to face time  providing counseling/discussing diagnostics/treatment plan with patient      ------------------------------------------------ Principal Problem:   Fall Active Problems:   Leucocytosis   Chronic diastolic heart failure ([place])   Essential hypertension   Depression   Morbid (severe) obesity due to excess calories ([place])   ESRD (end stage renal disease) ([place])   History of pulmonary embolism   Left hip pain   Tobacco abuse    HPI: Whitney Walker is a 66 y.o. female VTE on eliquis, hx colon cancer 2009 no evidence disease currently, chronic left hip pain, PVD s/p left fem-pop bypass distant past, failed LUE AVF, esrd on iHD, recent old HD catheter malfunction s/p guidewire exchange by IR on the left side, admitted 5/18 for sepsis follow dialysis on 5/17 found to have mssa bacteremia, in the setting of 1 day of weakness/lightheadedness and acute on chronic left lower back pain  Patient had no complication during last admission 05/01/22 to exchange the dialysis catheter. No sepsis at that time. She actually reported 1 week increased left hip pain at that time and chest pain as well. Underwent left cardiac catheterization:  Left Heart Catheterization 05/02/22:  RCA: Dominant.  Mild diffuse disease. Left main: Large vessel, very short and immediately bifurcates. Calcified. LCx: Large vessel, [place] to a very high large OM1, a moderate-sized OM 2 distally, mild to moderate diffuse disease is present.  No high-grade stenosis. LAD: Large vessel, gives coronary to a very large D1, mild diffuse disease  again evident without any focal high-grade stenosis.  Diffuse coronary calcification is again evident.  LAD is tortuous in the mid to distal segment suggestive of hypertensive heart disease.  Recommendation: [name] positive cardiac markers probably related to very small vessel disease.  Do not see any obvious dissection in the major coronary vessels or thrombus formation or occlusion.  No  significant change in coronary anatomy since prior angiography a year ago in 2022.  Recommend continuing medical therapy.  30 mL contrast utilized.   She was well at baseline until 1 day prior to admission when she woke up feeling malaise/weak/lightheadedness and actually had a fall. Went to dialysis and had subjective chill/dyspnea. Subsequently went to heme-oncology clinic for anemia and had sbp in the 80s so sent for admission  On arrive has leukocytosis to 22, no fever Bcx returns with mssa Mri showed left si joint/sacral alas edema She was started on bsAbx but transitioned to cefazolin today after bcx returned with mssa by bcid  She is feeling well today with moderate left hip pain; she can still move the left LE well  No headache, visual change, current chest pain, other joint pain, rash  I discussed with her about removing the dialysis cath and she cries stating it was very painful last time    Family History  Problem Relation Age of Onset   Cancer Mother 94       breast and bone   Cancer Father 56       prostate   Hypertension Sister    Bleeding Disorder Sister    Cancer Cousin 61       breast cancer    Hypertension Daughter    Breast cancer Neg Hx     Social History   Tobacco Use   Smoking status: Some Days    Packs/day: 0.50    Years: 50.00    Pack years: 25.00    Types: Cigarettes    Last attempt to quit: 02/03/2021    Years since quitting: 1.2   Smokeless tobacco: Former   Tobacco comments:    She is using a Mining engineer Use: Every day   Devices: Vuse  Substance Use Topics   Alcohol use: No    Alcohol/week: 0.0 standard drinks    Comment: she used to drink alcohol, quit in 2010; h/o heavy use    Drug use: Not Currently    Types: Oxycodone    Allergies  Allergen Reactions   Carnosine     Other reaction(s): Unknown   Gadolinium Derivatives Hives and Other (See Comments)    HIVES, Desc: HIVES W/ "DYE" USED FOR 1ST CT SCAN BUT NOT 2ND,  NO PREMEDS USED, PT UNCERTAIN OF CIRCUMSTANCES,,?POSSIBLE MRI CONTRAST ALLERGY, ALL STUDIES DONE "SOMEWHERE" IN [place]//A.C., Onset Date: [id number]   Iohexol Hives and Other (See Comments)    Desc: HIVES W/ "DYE" USED FOR 1ST CT SCAN BUT NOT 2ND, NO PREMEDS USED, PT UNCERTAIN OF CIRCUMSTANCES,,?POSSIBLE MRI CONTRAST ALLERGY, ALL STUDIES DONE "SOMEWHERE" IN [place]//A.C., Onset Date: [id number]    Iodinated Contrast Media Hives   Iodine Hives   Naltrexone Other (See Comments)    Unknown    Review of Systems: ROS All Other ROS was negative, except mentioned above   Past Medical History:  Diagnosis Date   Anemia    Anxiety    CHF (congestive heart failure) (HCC)    Colon cancer ([place])    treatment surgery   Complication of anesthesia  after first C- [place] "couldnt walk after", patient denies having a spinal   COPD (chronic obstructive pulmonary disease) ([place])    Coronary artery disease    Depression    DVT (deep venous thrombosis) (H[facility])    ESRD on hemodialysis ([place])    Hemo: MWF   History of blood transfusion 04/2018   Hypertension    07/07/18- no longer takes BP medications   Meningitis    Pain in limb 07/30/2013   PE (pulmonary embolism)    Peripheral vascular disease (HCC)    Restless legs    Shortness of breath    with exertion   Sleep apnea    SOB (shortness of breath) 03/03/2019   Vertigo        Scheduled Meds:  apixaban  2.5 mg Oral BID   aspirin  81 mg Oral Daily   atorvastatin  10 mg Oral QPM   Chlorhexidine Gluconate Cloth  6 each Topical Q0600   ferric citrate  840 mg Oral TID WC   gabapentin  300 mg Oral Daily   midodrine  5 mg Oral Q M,W,F-HD   pantoprazole  40 mg Oral Daily   Continuous Infusions:   ceFAZolin (ANCEF) IV     PRN Meds:.acetaminophen **OR** acetaminophen, albuterol, ferric citrate, oxyCODONE   OBJECTIVE: Blood pressure 111/63, pulse 90, temperature 97.8 F (36.6 C), temperature source Oral, resp. rate 18, height '5\' 2"'$   (1.575 m), weight 92.7 kg, SpO2 93 %.  Physical Exam General/constitutional: no distress, pleasant; cries when I told her the catheter have to be removed HEENT: Normocephalic, PER, Conj Clear, EOMI, Oropharynx clear Neck supple CV: rrr no mrg Lungs: clear to auscultation, normal respiratory effort Abd: Soft, Nontender Ext: no edema; left UE previous avf I can't feel thrill; nontender along entire left ue Skin: No Rash Neuro: nonfocal MSK: very tender palpation lower lumbar and left sacroiliac joint area  Central line presence: yes; left chest tunneled HD catheter site no purulence/tenderness/redness    Lab Results Lab Results  Component Value Date   WBC 22.0 (H) 05/17/2022   HGB 10.8 (L) 05/17/2022   HCT 32.6 (L) 05/17/2022   MCV 92.1 05/17/2022   PLT 192 05/17/2022    Lab Results  Component Value Date   CREATININE 9.04 (H) 05/17/2022   BUN 47 (H) 05/17/2022   NA 130 (L) 05/17/2022   K 5.4 (H) 05/17/2022   CL 100 05/17/2022   CO2 15 (L) 05/17/2022    Lab Results  Component Value Date   ALT 14 05/17/2022   AST 25 05/17/2022   ALKPHOS 89 05/17/2022   BILITOT 0.8 05/17/2022      Microbiology: Recent Results (from the past 240 hour(s))  Resp Panel by RT-PCR (Flu A&B, Covid) Nasopharyngeal Swab     Status: None   Collection Time: 05/16/22  2:12 PM   Specimen: Nasopharyngeal Swab; Nasopharyngeal(NP) swabs in vial transport medium  Result Value Ref Range Status   SARS Coronavirus 2 by RT PCR NEGATIVE NEGATIVE Final    Comment: (NOTE) SARS-CoV-2 target nucleic acids are NOT DETECTED.  The SARS-CoV-2 RNA is generally detectable in upper respiratory specimens during the acute phase of infection. The lowest concentration of SARS-CoV-2 viral copies this assay can detect is 138 copies/mL. A negative result does not preclude SARS-Cov-2 infection and should not be used as the sole basis for treatment or other patient management decisions. A negative result may occur with   improper specimen collection/handling, submission of specimen other than nasopharyngeal swab,  presence of viral mutation(s) within the areas targeted by this assay, and inadequate number of viral copies(<138 copies/mL). A negative result must be combined with clinical observations, patient history, and epidemiological information. The expected result is Negative.  Fact Sheet for Patients:  EntrepreneurPulse.com.au  Fact Sheet for Healthcare Providers:  IncredibleEmployment.be  This test is no t yet approved or cleared by the [place] FDA and  has been authorized for detection and/or diagnosis of SARS-CoV-2 by FDA under an Emergency Use Authorization (EUA). This EUA will remain  in effect (meaning this test can be used) for the duration of the COVID-19 declaration under Section 564(b)(1) of the Act, 21 U.S.C.section 360bbb-3(b)(1), unless the authorization is terminated  or revoked sooner.       Influenza A by PCR NEGATIVE NEGATIVE Final   Influenza B by PCR NEGATIVE NEGATIVE Final    Comment: (NOTE) The Xpert Xpress SARS-CoV-2/FLU/RSV plus assay is intended as an aid in the diagnosis of influenza from Nasopharyngeal swab specimens and should not be used as a sole basis for treatment. Nasal washings and aspirates are unacceptable for Xpert Xpress SARS-CoV-2/FLU/RSV testing.  Fact Sheet for Patients: EntrepreneurPulse.com.au  Fact Sheet for Healthcare Providers: IncredibleEmployment.be  This test is not yet approved or cleared by the [place] FDA and has been authorized for detection and/or diagnosis of SARS-CoV-2 by FDA under an Emergency Use Authorization (EUA). This EUA will remain in effect (meaning this test can be used) for the duration of the COVID-19 declaration under Section 564(b)(1) of the Act, 21 U.S.C. section 360bbb-3(b)(1), unless the authorization is terminated  or revoked.  Performed at [facility], [place] [address] [place], [place] [address]   Culture, blood (routine x 2)     Status: None (Preliminary result)   Collection Time: 05/16/22  5:40 PM   Specimen: BLOOD  Result Value Ref Range Status   Specimen Description   Final    BLOOD SITE NOT SPECIFIED Performed at [facility] [address], [place], [place] [address]    Special Requests   Final    BOTTLES DRAWN AEROBIC AND ANAEROBIC Blood Culture adequate volume Performed at [facility] [address], [place], [place] [address]    Culture  Setup Time   Final    GRAM POSITIVE COCCI IN CLUSTERS AEROBIC BOTTLE ONLY CRITICAL RESULT CALLED TO, READ BACK BY AND VERIFIED WITH: Dale [place] [id number] FCP Performed at [facility], [place] [address], [place], [place] [address]    Culture GRAM POSITIVE COCCI  Final   Report Status PENDING  Incomplete  Culture, blood (routine x 2)     Status: None (Preliminary result)   Collection Time: 05/16/22  5:40 PM   Specimen: BLOOD  Result Value Ref Range Status   Specimen Description   Final    BLOOD Performed at [facility] [address], [place], [place] [address]    Special Requests   Final    BOTTLES DRAWN AEROBIC AND ANAEROBIC Blood Culture results may not be optimal due to an excessive volume of blood received in culture bottles Performed at [facility] [address], [place], [place] [address]    Culture  Setup Time   Final    GRAM POSITIVE COCCI IN CLUSTERS IN BOTH AEROBIC AND ANAEROBIC BOTTLES Performed at [facility], [place] [address] [place], [place] [address]    Culture [facility] POSITIVE COCCI  Final   Report Status  PENDING  Incomplete  Blood Culture ID Panel (Reflexed)     Status: Abnormal   Collection Time: 05/16/22  5:40 PM  Result Value Ref Range Status   Enterococcus faecalis NOT DETECTED NOT DETECTED Final   Enterococcus  Faecium NOT DETECTED NOT DETECTED Final   Listeria monocytogenes NOT DETECTED NOT DETECTED Final   Staphylococcus species DETECTED (A) NOT DETECTED Final    Comment: CRITICAL RESULT CALLED TO, READ BACK BY AND VERIFIED WITH: PHARMD ALEX L [id number] FCP    Staphylococcus aureus (BCID) DETECTED (A) NOT DETECTED Final    Comment: CRITICAL RESULT CALLED TO, READ BACK BY AND VERIFIED WITH: PHARMD ALEX L [id number] FCP    Staphylococcus epidermidis NOT DETECTED NOT DETECTED Final   Staphylococcus lugdunensis NOT DETECTED NOT DETECTED Final   Streptococcus species NOT DETECTED NOT DETECTED Final   Streptococcus agalactiae NOT DETECTED NOT DETECTED Final   Streptococcus pneumoniae NOT DETECTED NOT DETECTED Final   Streptococcus pyogenes NOT DETECTED NOT DETECTED Final   A.calcoaceticus-baumannii NOT DETECTED NOT DETECTED Final   Bacteroides fragilis NOT DETECTED NOT DETECTED Final   Enterobacterales NOT DETECTED NOT DETECTED Final   Enterobacter cloacae complex NOT DETECTED NOT DETECTED Final   Escherichia coli NOT DETECTED NOT DETECTED Final   Klebsiella aerogenes NOT DETECTED NOT DETECTED Final   Klebsiella oxytoca NOT DETECTED NOT DETECTED Final   Klebsiella pneumoniae NOT DETECTED NOT DETECTED Final   Proteus species NOT DETECTED NOT DETECTED Final   Salmonella species NOT DETECTED NOT DETECTED Final   Serratia marcescens NOT DETECTED NOT DETECTED Final   Haemophilus influenzae NOT DETECTED NOT DETECTED Final   Neisseria meningitidis NOT DETECTED NOT DETECTED Final   Pseudomonas aeruginosa NOT DETECTED NOT DETECTED Final   Stenotrophomonas maltophilia NOT DETECTED NOT DETECTED Final   Candida albicans NOT DETECTED NOT DETECTED Final   Candida auris NOT DETECTED NOT DETECTED Final   Candida glabrata NOT DETECTED NOT DETECTED Final   Candida krusei NOT DETECTED NOT DETECTED Final   Candida parapsilosis NOT DETECTED NOT DETECTED Final   Candida tropicalis NOT DETECTED NOT DETECTED Final    Cryptococcus neoformans/gattii NOT DETECTED NOT DETECTED Final   Meth resistant mecA/C and MREJ NOT DETECTED NOT DETECTED Final    Comment: Performed at [facility], [address] [place], [place] [address]     Serology:    Imaging: If present, new imagings (plain films, ct scans, and mri) have been personally visualized and interpreted; radiology reports have been reviewed. Decision making incorporated into the Impression / Recommendations.  5/18 head ct No acute intracranial abnormality  5/18 mri left hip without contrast 1. Abnormal edema in the left sacral ala is infiltrative but nonspecific. A stress fracture could potentially give this appearance and the adjacent edema tracking along the left iliacus muscle and left sacral plexus. Left sacral osteomyelitis is not totally excluded but there no effusion of the SI joint or edema along the iliac side of the SI joint to further suggest septic SI joint. 2. Low-level edema anteriorly in the gluteus maximus muscle near the greater trochanter, nonspecific but possibly reflecting a muscle strain.  5/18 xray chest FINDINGS: LEFT-sided central venous line tip in the distal SVC. Stable cardiac silhouette. Mild central venous congestion pattern. Low lung volumes.   IMPRESSION: Central venous congestion.  No overt pulmonary edema.   [name], [facility] [place] [phone] pager    05/17/2022, 11:14 AM

## 2022-05-17 NOTE — Evaluation (Signed)
Physical Therapy Evaluation Patient Details Name: Whitney Walker MRN: [id number] DOB: 10-19-1956 Today's Date: 05/17/2022  History of Present Illness  Pt is a 66 y.o. female admitted 5/18 with diagnosis of hypostension and fall. PMH: ESRD on HD MWF, COPD, CAD, DVT on chronic anticoagulation, HTN, OA with chronic left hip pain, depression, history of PE   Clinical Impression  Pt admitted with above diagnosis. PTA pt lived at home alone, mod I mobility with RW household distances. Pt currently with functional limitations due to the deficits listed below (see PT Problem List). On eval, pt required min guard assist bed mobility, min guard assist transfers, and min guard assist ambulation 30' with RW. Gait distance limited by L hip pain. Pt will benefit from skilled PT to increase their independence and safety with mobility to allow discharge to the venue listed below.          Recommendations for follow up therapy are one component of a multi-disciplinary discharge planning process, led by the attending physician.  Recommendations may be updated based on patient status, additional functional criteria and insurance authorization.  Follow Up Recommendations Home health PT    Assistance Recommended at Discharge PRN  Patient can return home with the following  Assistance with cooking/housework;Assist for transportation;Help with stairs or ramp for entrance    Equipment Recommendations None recommended by PT  Recommendations for Other Services       Functional Status Assessment Patient has had a recent decline in their functional status and demonstrates the ability to make significant improvements in function in a reasonable and predictable amount of time.     Precautions / Restrictions Precautions Precautions: Fall;Other (comment) Precaution Comments: watch BP      Mobility  Bed Mobility Overal bed mobility: Needs Assistance Bed Mobility: Supine to Sit, Sit to Supine     Supine  to sit: Min guard, HOB elevated Sit to supine: Min guard, HOB elevated   General bed mobility comments: +rail, increased time and effort    Transfers Overall transfer level: Needs assistance Equipment used: Rolling walker (2 wheels) Transfers: Sit to/from Stand Sit to Stand: Min guard           General transfer comment: increased time to power up, assist for safety    Ambulation/Gait Ambulation/Gait assistance: Min guard Gait Distance (Feet): 30 Feet Assistive device: Rolling walker (2 wheels) Gait Pattern/deviations: Step-through pattern, Decreased stride length Gait velocity: decreased Gait velocity interpretation: <1.31 ft/sec, indicative of household ambulator   General Gait Details: distance limited by L hip pain  Stairs            Wheelchair Mobility    Modified Rankin (Stroke Patients Only)       Balance Overall balance assessment: Needs assistance Sitting-balance support: No upper extremity supported, Feet supported Sitting balance-Leahy Scale: Good     Standing balance support: Bilateral upper extremity supported, During functional activity, Reliant on assistive device for balance Standing balance-Leahy Scale: Poor                               Pertinent Vitals/Pain Pain Assessment Pain Assessment: 0-10 Pain Score: 8  Pain Location: L hip Pain Descriptors / Indicators: Discomfort, Grimacing, Sore Pain Intervention(s): Monitored during session, Limited activity within patient's tolerance, Repositioned, Premedicated before session    Home Living Family/patient expects to be discharged to:: Private residence Living Arrangements: Alone Available Help at Discharge: Family;Available PRN/intermittently Type of Home: Apartment ("its  made like a house, but its an apartment") Home Access: Stairs to enter Entrance Stairs-Rails: Right;Left Entrance Stairs-Number of Steps: 1 (threshold)   Home Layout: One level Home Equipment: Hotel manager (2 wheels);Rollator (4 wheels) Additional Comments: states shower seat does not fit in her tub, tub soaks rather than shower due to HD cath    Prior Function Prior Level of Function : Independent/Modified Independent             Mobility Comments: uses motorized scooter in grocery store ADLs Comments: [employer] Whe[employer] for transport to/from HD     Hand Dominance   Dominant Hand: Right    Extremity/Trunk Assessment   Upper Extremity Assessment Upper Extremity Assessment: Defer to OT evaluation    Lower Extremity Assessment Lower Extremity Assessment: Generalized weakness       Communication   Communication: No difficulties  Cognition Arousal/Alertness: Awake/alert Behavior During Therapy: WFL for tasks assessed/performed Overall Cognitive Status: Within Functional Limits for tasks assessed                                          General Comments General comments (skin integrity, edema, etc.): Orthostatic BP: supine 113/66, sitting 97/65, standing 110/80    Exercises     Assessment/Plan    PT Assessment Patient needs continued PT services  PT Problem List Decreased strength;Decreased mobility;Decreased activity tolerance;Pain;Decreased balance       PT Treatment Interventions Therapeutic activities;Gait training;Therapeutic exercise;Patient/family education;Stair training;Balance training;Functional mobility training    PT Goals (Current goals can be found in the Care Plan section)  Acute Rehab PT Goals Patient Stated Goal: manage L hip pain PT Goal Formulation: With patient Time For Goal Achievement: 05/31/22 Potential to Achieve Goals: Good    Frequency Min 3X/week     Co-evaluation               AM-PAC PT "6 Clicks" Mobility  Outcome Measure Help needed turning from your back to your side while in a flat bed without using bedrails?: None Help needed moving from lying on your back to sitting on the side of a flat  bed without using bedrails?: A Little Help needed moving to and from a bed to a chair (including a wheelchair)?: A Little Help needed standing up from a chair using your arms (e.g., wheelchair or bedside chair)?: A Little Help needed to walk in hospital room?: A Little Help needed climbing 3-5 steps with a railing? : A Lot 6 Click Score: 18    End of Session Equipment Utilized During Treatment: Gait belt Activity Tolerance: Patient limited by pain Patient left: in bed;with call bell/phone within reach;with bed alarm set Nurse Communication: Mobility status PT Visit Diagnosis: Difficulty in walking, not elsewhere classified (R26.2)    Time: 4492-0100 PT Time Calculation (min) (ACUTE ONLY): 20 min   Charges:   PT Evaluation $PT Eval Moderate Complexity: 1 Mod          Lorrin Goodell, PT  Office # [phone] Pager [phone]   Lorriane Shire 05/17/2022, 12:34 PM

## 2022-05-17 NOTE — Procedures (Signed)
Pt arrived to the hemodialysis clinic with soft and hypotensive BP's, [place] MD was made aware and order received for 0 UF removal. Permcath dressing had no date and as such dressing was changed as per policy, with no redness or tenderness noted at insertion site; limbs flush and draw easily.  Pt complains of pain throughout her treatment, due to hypotension pt was educated that she would be able to receive pain meds at end of treatment in order to not decrease her BP further.  No notable change in pt assessment and at end of treatment Pt BP was 125/68 with a net 0 uf removed.  Oxycodone '5mg'$  given.

## 2022-05-17 NOTE — Consult Note (Signed)
Renal Service Consult Note [facility]  Whitney Walker 05/17/2022 Whitney Blazing, MD Requesting Physician: Dr. Nena [name]  Reason for Consult: ESRD pt w/ bacteremia HPI: The patient is a 66 y.o. year-old w/ hx of anemia, COPD, hx colon cancer, depression, hx DVT, HTN, PVD, OSA who presented after episode of feeling very weak and dizzy/ lightheaded. She went to her [place] [facility] appt yesterday and her BP was in the 80s. She was sent to ED where BP 90s and WBC 25K. Blood cx's taken and IV abx started. Pt admitted. Today blood cx's are positive for S. Aureus. We are asked to see for ESRD.    Pt seen in HD unit. Main c/o is L hip pain, off and on for  months. Pt lives at home alone, takes care of herself.   Pt admit here May 2023 for high K+ and exchange of malfunctioning HD catheter, atypical CP (heart cath w/o major stenosis) and L hip pain.  Pt admit here July 2022 w/ N/V and fevers due to MSSA bacteremia. She had HD cath removed/ replaced and IV abx w/ Cefazolin. Also during that admit she had L hand pain / numbness seen by [facility] who said that her L arm AVF didn't have a thrill and "appears either thrombosed or to have very little flow through it...she has an easily palpable radial pulse at the wrist...her steal study was essentially normal showing no significant increase in digital pressure with compression of the fistula". Her symptoms resolved w/o rx.    ROS - denies CP, no joint pain, no HA, no blurry vision, no rash, no diarrhea, no nausea/ vomiting, no dysuria, no difficulty voiding   Past Medical History  Past Medical History:  Diagnosis Date   Anemia    Anxiety    CHF (congestive heart failure) (HCC)    Colon cancer ([place])    treatment surgery   Complication of anesthesia    after first C- [place] "couldnt walk after", patient denies having a spinal   COPD (chronic obstructive pulmonary disease) ([place])    Coronary artery disease    Depression    DVT (deep  venous thrombosis) (HCC)    ESRD on hemodialysis ([place])    Hemo: MWF   History of blood transfusion 04/2018   Hypertension    07/07/18- no longer takes BP medications   Meningitis    Pain in limb 07/30/2013   PE (pulmonary embolism)    Peripheral vascular disease (HCC)    Restless legs    Shortness of breath    with exertion   Sleep apnea    SOB (shortness of breath) 03/03/2019   Vertigo    Past Surgical History  Past Surgical History:  Procedure Laterality Date   ABDOMINAL AORTAGRAM N/A 07/26/2013   Procedure: ABDOMINAL Maxcine Ham;  Surgeon: [name] Franklinton, MD;  Location: [facility] CATH LAB;  Service: Cardiovascular;  Laterality: N/A;   ABDOMINAL HYSTERECTOMY     AV FISTULA PLACEMENT Left 05/05/2018   Procedure: ARTERIOVENOUS (AV) FISTULA CREATION BRACHIOCEPHALIC;  Surgeon: Waynetta Sandy, MD;  Location: [place];  Service: Vascular;  Laterality: Left;   AV FISTULA PLACEMENT Left 07/16/2018   Procedure: ARTERIOVENOUS FISTULA CREATION;  Surgeon: Waynetta Sandy, MD;  Location: [place];  Service: Vascular;  Laterality: Left;   [place] Left 09/03/2018   Procedure: BASILIC VEIN TRANSPOSITION SECOND STAGE;  Surgeon: Waynetta Sandy, MD;  Location: [place];  Service: Vascular;  Laterality: Left;   BREAST BIOPSY  Left    CESAREAN SECTION     X 3 1974-1977   CHOLECYSTECTOMY     CHOLECYSTECTOMY  1980/s   COLECTOMY  2010   DIVERTICULOSIS SURGERY-2002  2012   FEMORAL-POPLITEAL BYPASS GRAFT Left 08/13/2013   Procedure: BYPASS GRAFT FEMORAL-POPLITEAL ARTERY WITH NON-REVERSED SAPHANEOUS VEIN; ULTRASOUND GUIDED;  Surgeon: Mal [name], MD;  Location: [place];  Service: Vascular;  Laterality: Left;   INSERTION OF DIALYSIS CATHETER Right 04/30/2018   Procedure: INSERTION OF Right Internal Jugular DIALYSIS CATHETER;  Surgeon: [name], MD;  Location: [facility] OR;  Service: Vascular;  Laterality: Right;   IR FLUORO GUIDE CV LINE LEFT  05/01/2022   IR FLUORO GUIDE CV LINE  RIGHT  07/24/2020   IR FLUORO GUIDE CV LINE RIGHT  07/21/2021   IR REMOVAL TUN CV CATH W/O FL  07/21/2020   IR REMOVAL TUN CV CATH W/O FL  07/19/2021   IR US GUIDE VASC ACCESS RIGHT  07/24/2020   IR US GUIDE VASC ACCESS RIGHT  07/21/2021   LEFT HEART CATH AND CORONARY ANGIOGRAPHY N/A 05/01/2021   Procedure: LEFT HEART CATH AND CORONARY ANGIOGRAPHY;  Surgeon: [name], MD;  Location: [place] CV LAB;  Service: Cardiovascular;  Laterality: N/A;   LEFT HEART CATH AND CORONARY ANGIOGRAPHY N/A 05/02/2022   Procedure: LEFT HEART CATH AND CORONARY ANGIOGRAPHY;  Surgeon: [name], MD;  Location: [facility] [facility] CV LAB;  Service: Cardiovascular;  Laterality: N/A;   LOWER EXTREMITY ANGIOGRAM Left 07/26/2013   Procedure: LOWER EXTREMITY ANGIOGRAM;  Surgeon: [name] Kalkaska, MD;  Location: [facility] CATH LAB;  Service: Cardiovascular;  Laterality: Left;   Family History  Family History  Problem Relation Age of Onset   Cancer Mother 49       breast and bone   Cancer Father 1       prostate   Hypertension Sister    Bleeding Disorder Sister    Cancer Cousin 55       breast cancer    Hypertension Daughter    Breast cancer Neg Hx    Social History  reports that she has been smoking cigarettes. She has a 25.00 pack-year smoking history. She has quit using smokeless tobacco. She reports that she does not currently use drugs after having used the following drugs: Oxycodone. She reports that she does not drink alcohol. Allergies  Allergies  Allergen Reactions   Carnosine     Other reaction(s): Unknown   Gadolinium Derivatives Hives and Other (See Comments)    HIVES, Desc: HIVES W/ "DYE" USED FOR 1ST CT SCAN BUT NOT 2ND, NO PREMEDS USED, PT UNCERTAIN OF CIRCUMSTANCES,,?POSSIBLE MRI CONTRAST ALLERGY, ALL STUDIES DONE "SOMEWHERE" IN [place]//A.C., Onset Date: [id number]   Iohexol Hives and Other (See Comments)    Desc: HIVES W/ "DYE" USED FOR 1ST CT SCAN BUT NOT 2ND, NO PREMEDS USED, PT UNCERTAIN OF  CIRCUMSTANCES,,?POSSIBLE MRI CONTRAST ALLERGY, ALL STUDIES DONE "SOMEWHERE" IN [place]//A.C., Onset Date: 79892119    Iodinated Contrast Media Hives   Iodine Hives   Naltrexone Other (See Comments)    Unknown   Home medications Prior to Admission medications   Medication Sig Start Date End Date Taking? Authorizing Provider  acetaminophen (TYLENOL) 650 MG CR tablet Take 1,950-2,600 mg by mouth 2 (two) times daily as needed for pain. Do not exceed more than '4000mg'$  (or 6 capsules) in 24hrs.   Yes [provider]  albuterol (VENTOLIN HFA) 108 (90 Base) MCG/ACT inhaler Inhale 2 puffs into the lungs every  6 (six) hours as needed for wheezing or shortness of breath. 08/03/20  Yes [name], FNP  Ascorbic Acid (VITAMIN C) 100 MG tablet Take 100 mg by mouth daily.   Yes [provider]  aspirin 81 MG chewable tablet Chew 1 tablet (81 mg total) by mouth daily. 05/03/22  Yes Ghimire, Henreitta Leber, MD  atorvastatin (LIPITOR) 10 MG tablet TAKE ONE TABLET BY MOUTH ONCE DAILY Patient taking differently: Take 10 mg by mouth every evening. 05/13/22  Yes Cantwell, Celeste C, PA-C  AURYXIA 1 GM 210 MG(Fe) tablet Take 420-840 mg by mouth See admin instructions. Take '840mg'$  by mouth three times daily with meals and '420mg'$  with snacks. 05/15/21  Yes [provider]  Biotin 10000 MCG TABS Take 10,000 mcg by mouth daily.    Yes [provider]  ELIQUIS 2.5 MG TABS tablet TAKE ONE TABLET BY MOUTH TWICE DAILY Patient taking differently: Take 2.5 mg by mouth 2 (two) times daily. 04/15/22  Yes [name], FNP  gabapentin (NEURONTIN) 300 MG capsule Take 300 mg by mouth daily. 05/06/22  Yes [provider]  meclizine (ANTIVERT) 12.5 MG tablet Take 1 tablet (12.5 mg total) by mouth 3 (three) times daily as needed for dizziness. 04/12/21  Yes Glendale Chard, MD  metoprolol succinate (TOPROL-XL) 25 MG 24 hr tablet Take 1 tablet (25 mg total) by mouth daily. 05/03/22  Yes Ghimire, Henreitta Leber, MD  midodrine (PROAMATINE) 10 MG tablet Take 5 mg by mouth every Monday, Wednesday, and Friday with hemodialysis. 04/17/22  Yes [provider]  multivitamin (RENA-VIT) TABS tablet Take 1 tablet by mouth every evening. 04/01/22  Yes [provider]  omeprazole (PRILOSEC) 20 MG capsule TAKE ONE CAPSULE BY MOUTH ONCE DAILY Patient taking differently: Take 20 mg by mouth daily. 04/26/22  Yes Truitt Merle, MD  oxyCODONE-acetaminophen (PERCOCET) 5-325 MG tablet Take 1 tablet by mouth every 4 (four) hours as needed for severe pain. 08/09/21  Yes [name], FNP  vitamin E 200 UNIT capsule Take 200 Units by mouth daily.   Yes [provider]  cyclobenzaprine (FLEXERIL) 10 MG tablet Take 1 tablet (10 mg total) by mouth 3 (three) times daily as needed for muscle spasms. Patient not taking: Reported on 05/16/2022 11/15/21   [name], FNP     Vitals:   05/17/22 0320 05/17/22 0741 05/17/22 1126 05/17/22 1132  BP: (!) 91/52 111/63 (!) 80/43 (!) 103/51  Pulse: 100 90 (!) 104   Resp: '16 18 16 13  '$ Temp: 98 F (36.7 C) 97.8 F (36.6 C) 98 F (36.7 C)   TempSrc: Oral Oral Oral   SpO2: 92% 93%    Weight:   89.7 kg   Height:       Exam Gen alert, no distress No rash, cyanosis or gangrene Sclera anicteric, throat clear  No jvd or bruits Chest clear bilat to bases, no rales/ wheezing RRR no RG Abd soft ntnd no mass or ascites +bs GU defer MS no joint effusions or deformity Ext trace LE edema, no wounds or ulcers Neuro is alert, Ox 3 , nf    LIJ TDC in place, no gross infection      Home meds include - auryxia, albuterol, asa, lipitor, eliquis, neurontin 300, midodrine 5 pre hd mwf     OP HD: GO MWF  3.5h  92kg    400/1.5  2/2.5 bath  LIJ TDC  Hep 6000 - last HD 5/17 93.8 > 91.6 kg - last Hb  10.6 on 5/17 - hectorol 5 mcg IV tiw   Assessment/ Plan: MSSA bacteremia - w/ fall, gen'd weakness, chills. Has TDC in chest, suspected HD cath infection.  Will plan  for catheter holiday, consulting IR for removal / replacement. Getting IV cefazolin.  ESRD - on HD MWF.  HD today.  BP/vol - is under dry wt, hypotensive, keep even on HD today L hip pain - per pmd Anemia esrd - Hb 11, no esa needs MBD ckd - CCa in range, will add on phos Hyperkalemia - mild, 5.4. use 1K bath for 90 min.       Kelly Splinter  MD 05/17/2022, 12:00 PM Recent Labs  Lab 05/16/22 1407 05/17/22 0047  HGB 10.9* 10.8*  ALBUMIN 2.5* 2.0*  CALCIUM 8.8* 8.6*  CREATININE 8.21* 9.04*  K 5.1 5.4*

## 2022-05-17 NOTE — Progress Notes (Signed)
Pt receives out-pt HD at [facility] on MWF. Pt arrives at 5:40 for 6:00 chair time. Will assist as needed.   Melven Sartorius Renal Navigator [phone]

## 2022-05-17 NOTE — Procedures (Signed)
Pre procedural Dx: ESRD Post procedural Dx: Same  Successful removal of tunneled HD catheter  EBL < 1 mL No immediate complications.  Please see imaging section of Epic for full dictation.  Joaquim Nam PA-C 05/17/2022 4:38 PM

## 2022-05-17 NOTE — Progress Notes (Signed)
PHARMACY - PHYSICIAN COMMUNICATION CRITICAL VALUE ALERT - BLOOD CULTURE IDENTIFICATION (BCID)  Whitney Walker is an 66 y.o. female who presented to [facility] on 05/16/2022 with a chief complaint of generalized weakness and hypotension.   Assessment:  4/4 bottles with GPC in clusters; BCID identified MSSA  Name of physician (or Provider) Contacted: Dr. Sloan Leiter and ID team  Current antibiotics:None  Changes to prescribed antibiotics recommended:  Recommendations accepted by provider Start Cefazolin 1g IV q24h for now and follow-up with consistent HD schedule for dose adjustment.  Results for orders placed or performed during the hospital encounter of 05/16/22  Blood Culture ID Panel (Reflexed) (Collected: 05/16/2022  5:40 PM)  Result Value Ref Range   Enterococcus faecalis NOT DETECTED NOT DETECTED   Enterococcus Faecium NOT DETECTED NOT DETECTED   Listeria monocytogenes NOT DETECTED NOT DETECTED   Staphylococcus species DETECTED (A) NOT DETECTED   Staphylococcus aureus (BCID) DETECTED (A) NOT DETECTED   Staphylococcus epidermidis NOT DETECTED NOT DETECTED   Staphylococcus lugdunensis NOT DETECTED NOT DETECTED   Streptococcus species NOT DETECTED NOT DETECTED   Streptococcus agalactiae NOT DETECTED NOT DETECTED   Streptococcus pneumoniae NOT DETECTED NOT DETECTED   Streptococcus pyogenes NOT DETECTED NOT DETECTED   A.calcoaceticus-baumannii NOT DETECTED NOT DETECTED   Bacteroides fragilis NOT DETECTED NOT DETECTED   Enterobacterales NOT DETECTED NOT DETECTED   Enterobacter cloacae complex NOT DETECTED NOT DETECTED   Escherichia coli NOT DETECTED NOT DETECTED   Klebsiella aerogenes NOT DETECTED NOT DETECTED   Klebsiella oxytoca NOT DETECTED NOT DETECTED   Klebsiella pneumoniae NOT DETECTED NOT DETECTED   Proteus species NOT DETECTED NOT DETECTED   Salmonella species NOT DETECTED NOT DETECTED   Serratia marcescens NOT DETECTED NOT DETECTED   Haemophilus influenzae NOT  DETECTED NOT DETECTED   Neisseria meningitidis NOT DETECTED NOT DETECTED   Pseudomonas aeruginosa NOT DETECTED NOT DETECTED   Stenotrophomonas maltophilia NOT DETECTED NOT DETECTED   Candida albicans NOT DETECTED NOT DETECTED   Candida auris NOT DETECTED NOT DETECTED   Candida glabrata NOT DETECTED NOT DETECTED   Candida krusei NOT DETECTED NOT DETECTED   Candida parapsilosis NOT DETECTED NOT DETECTED   Candida tropicalis NOT DETECTED NOT DETECTED   Cryptococcus neoformans/gattii NOT DETECTED NOT DETECTED   Meth resistant mecA/C and MREJ NOT DETECTED NOT [place], PharmD PGY2 Infectious Diseases Pharmacy Resident   Please check [email or web address] for unit-specific pharmacy phone numbers

## 2022-05-17 NOTE — Progress Notes (Addendum)
PROGRESS NOTE        PATIENT DETAILS Name: Whitney Walker Age: 66 y.o. Sex: female Date of Birth: Jan 30, 1956 Admit Date: 05/16/2022 Admitting Physician Donne Hazel, MD AYT:[id number], Doreene Burke, FNP  Brief Summary: Patient is a 66 y.o.  female with history of ESRD on HD MWF, VTE on Eliquis, chronic left hip pain-referred to the ED by oncologist for evaluation of tension/chills and a mechanical fall.  She was found to have significant leukocytosis-and subsequently admitted to the hospitalist service.   Post admission-blood cultures (prelim) with positive for MSSA.  Significant events: 5/3-5/5>> Hospitalized for malfunctioning HD catheter (exchanged by IR) and NSTEMI-LHC with small vessel disease 5/18>>Admit for chills/hypotension/mechanical fall-blood cultures +ve for MSSA  Significant studies: 5/18>> CXR: No PNA 5/18>> CT head: No acute intracranial abnormality 5/18>> MRI left hip: Abnormal edema in the left sacral alla-possible infiltrative-possible stress fracture.  Significant microbiology data: 5/18>> COVID/influenza PCR: Negative 5/18>> blood culture: Gram-positive cocci (MSSA on BCID)  Procedures: 5/3>> HD catheter exchanged by IR. 5/4>> left heart catheterization by Dr. Einar Gip  Consults: ID, nephrology, [facility] (msg left with office)  Subjective: Lying comfortably in bed-denies any chest pain or shortness of breath.  Continues to complain of left hip pain.  Objective: Vitals: Blood pressure 111/63, pulse 90, temperature 97.8 F (36.6 C), temperature source Oral, resp. rate 18, height '5\' 2"'$  (1.575 m), weight 92.7 kg, SpO2 93 %.   Exam: Gen Exam:Alert awake-not in any distress HEENT:atraumatic, normocephalic Chest: B/L clear to auscultation anteriorly CVS:S1S2 regular Abdomen:soft non tender, non distended Extremities:no edema Neurology: Non focal Skin: no rash   Pertinent Labs/Radiology:    Latest Ref Rng & Units 05/17/2022    12:47 AM 05/16/2022    2:07 PM 05/03/2022    1:18 AM  CBC  WBC 4.0 - 10.5 K/uL 22.0   25.1   9.1    Hemoglobin 12.0 - 15.0 g/dL 10.8   10.9   11.4    Hematocrit 36.0 - 46.0 % 32.6   33.4   36.6    Platelets 150 - 400 K/uL 192   269   139      Lab Results  Component Value Date   NA 130 (L) 05/17/2022   K 5.4 (H) 05/17/2022   CL 100 05/17/2022   CO2 15 (L) 05/17/2022      Assessment/Plan: Sepsis due to MSSA bacteremia: BP improved-culture data as above-obtain echo. She does have a HD catheter-catheter site appears benign without any obvious discharge.  HD catheter was recently exchanged by IR on 5/3 during her prior hospitalization.  Probably will need a line holiday at some point in time.  Await further recommendations from infectious disease.  Hypotension: Resolved-suspect this was likely due to sepsis/MSSA bacteremia-in recent diarrheal illness during Mother's Day weekend (resolved).  ESRD on HD MWF: Nephrology following.  Normocytic anemia: Due to ESRD-defer Aranesp/IV iron to nephrology.  Hyperkalemia: Mild-for HD later today.  Repeat electrolytes tomorrow.  History of recurrent VTE: On Eliquis.  Recent non-STEMI: No chest pain-recent LHC negative for large vessel disease-suspected to have small vessel disease.  Cardiology recommended aspirin x30 days along with Eliquis.  Chronic HFpEF: Euvolemic-volume removal with HD  HTN: BP soft-continue to hold Coreg  HLD: On statin  Acute on chronic left-sided hip pain: Complains of severe left hip pain-MRI as above-given bacteremia-have consulted orthopedics ([facility])  History of PAD s/p left femoral popliteal bypass in 2014  History of colon cancer: Follows with oncology/Dr. Collene Mares in the outpatient setting-in remission   OSA: Apparently not on CPAP at home.  Tobacco abuse: Counseled  Obesity: Estimated body mass index is 37.38 kg/m as calculated from the following:   Height as of this encounter: '5\' 2"'$  (1.575 m).    Weight as of this encounter: 92.7 kg.   Code status:   Code Status: Full Code   DVT Prophylaxis: apixaban (ELIQUIS) tablet 2.5 mg Start: 05/16/22 2200 apixaban (ELIQUIS) tablet 2.5 mg    Family Communication: Daughter-[name]-[phone]-left voicemail on 5/19   Disposition Plan: Status is: Inpatient Remains inpatient appropriate because: Sepsis due to MSSA bacteremia-noted stable for discharge.   Planned Discharge Destination:Home   Diet: Diet Order             Diet renal with fluid restriction Fluid restriction: 1200 mL Fluid; Room service appropriate? Yes; Fluid consistency: Thin  Diet effective now                     Antimicrobial agents: Anti-infectives (From admission, onward)    Start     Dose/Rate Route Frequency Ordered Stop   05/17/22 1115  ceFAZolin (ANCEF) IVPB 1 g/50 mL premix        1 g 100 mL/hr over 30 Minutes Intravenous Every 24 hours 05/17/22 1023     05/16/22 1715  vancomycin (VANCOREADY) IVPB 2000 mg/400 mL        2,000 mg 200 mL/hr over 120 Minutes Intravenous  Once 05/16/22 1701 05/16/22 2146   05/16/22 1715  ceFEPIme (MAXIPIME) 2 g in sodium chloride 0.9 % 100 mL IVPB        2 g 200 mL/hr over 30 Minutes Intravenous  Once 05/16/22 1701 05/16/22 1847        MEDICATIONS: Scheduled Meds:  apixaban  2.5 mg Oral BID   aspirin  81 mg Oral Daily   atorvastatin  10 mg Oral QPM   Chlorhexidine Gluconate Cloth  6 each Topical Q0600   ferric citrate  840 mg Oral TID WC   gabapentin  300 mg Oral Daily   midodrine  5 mg Oral Q M,W,F-HD   pantoprazole  40 mg Oral Daily   Continuous Infusions:   ceFAZolin (ANCEF) IV     PRN Meds:.acetaminophen **OR** acetaminophen, albuterol, ferric citrate, oxyCODONE   I have personally reviewed following labs and imaging studies  LABORATORY DATA: CBC: Recent Labs  Lab 05/16/22 1407 05/17/22 0047  WBC 25.1* 22.0*  HGB 10.9* 10.8*  HCT 33.4* 32.6*  MCV 95.2 92.1  PLT 269 192     Basic Metabolic Panel: Recent Labs  Lab 05/16/22 1407 05/17/22 0047  NA 134* 130*  K 5.1 5.4*  CL 101 100  CO2 20* 15*  GLUCOSE 122* 111*  BUN 41* 47*  CREATININE 8.21* 9.04*  CALCIUM 8.8* 8.6*    GFR: Estimated Creatinine Clearance: 6.5 mL/min (A) (by C-G formula based on SCr of 9.04 mg/dL (H)).  Liver Function Tests: Recent Labs  Lab 05/16/22 1407 05/17/22 0047  AST 21 25  ALT 16 14  ALKPHOS 109 89  BILITOT 0.6 0.8  PROT 7.4 6.6  ALBUMIN 2.5* 2.0*   No results for input(s): LIPASE, AMYLASE in the last 168 hours. No results for input(s): AMMONIA in the last 168 hours.  Coagulation Profile: Recent Labs  Lab 05/16/22 1407  INR 1.2    Cardiac  Enzymes: No results for input(s): CKTOTAL, CKMB, CKMBINDEX, TROPONINI in the last 168 hours.  BNP (last 3 results) No results for input(s): PROBNP in the last 8760 hours.  Lipid Profile: No results for input(s): CHOL, HDL, LDLCALC, TRIG, CHOLHDL, LDLDIRECT in the last 72 hours.  Thyroid Function Tests: No results for input(s): TSH, T4TOTAL, FREET4, T3FREE, THYROIDAB in the last 72 hours.  Anemia Panel: No results for input(s): VITAMINB12, FOLATE, FERRITIN, TIBC, IRON, RETICCTPCT in the last 72 hours.  Urine analysis:    Component Value Date/Time   COLORURINE YELLOW 03/02/2019 1416   APPEARANCEUR CLEAR 03/02/2019 1416   LABSPEC 1.010 03/02/2019 1416   PHURINE 6.0 03/02/2019 1416   GLUCOSEU 50 (A) 03/02/2019 1416   HGBUR SMALL (A) 03/02/2019 1416   BILIRUBINUR NEGATIVE 03/02/2019 1416   KETONESUR 5 (A) 03/02/2019 1416   PROTEINUR 100 (A) 03/02/2019 1416   UROBILINOGEN 0.2 10/19/2015 1532   NITRITE NEGATIVE 03/02/2019 1416   LEUKOCYTESUR NEGATIVE 03/02/2019 1416    Sepsis Labs: Lactic Acid, Venous    Component Value Date/Time   LATICACIDVEN 1.1 05/17/2022 0047    MICROBIOLOGY: Recent Results (from the past 240 hour(s))  Resp Panel by RT-PCR (Flu A&B, Covid) Nasopharyngeal Swab     Status: None    Collection Time: 05/16/22  2:12 PM   Specimen: Nasopharyngeal Swab; Nasopharyngeal(NP) swabs in vial transport medium  Result Value Ref Range Status   SARS Coronavirus 2 by RT PCR NEGATIVE NEGATIVE Final    Comment: (NOTE) SARS-CoV-2 target nucleic acids are NOT DETECTED.  The SARS-CoV-2 RNA is generally detectable in upper respiratory specimens during the acute phase of infection. The lowest concentration of SARS-CoV-2 viral copies this assay can detect is 138 copies/mL. A negative result does not preclude SARS-Cov-2 infection and should not be used as the sole basis for treatment or other patient management decisions. A negative result may occur with  improper specimen collection/handling, submission of specimen other than nasopharyngeal swab, presence of viral mutation(s) within the areas targeted by this assay, and inadequate number of viral copies(<138 copies/mL). A negative result must be combined with clinical observations, patient history, and epidemiological information. The expected result is Negative.  Fact Sheet for Patients:  EntrepreneurPulse.com.au  Fact Sheet for Healthcare Providers:  IncredibleEmployment.be  This test is no t yet approved or cleared by the [place] FDA and  has been authorized for detection and/or diagnosis of SARS-CoV-2 by FDA under an Emergency Use Authorization (EUA). This EUA will remain  in effect (meaning this test can be used) for the duration of the COVID-19 declaration under Section 564(b)(1) of the Act, 21 U.S.C.section 360bbb-3(b)(1), unless the authorization is terminated  or revoked sooner.       Influenza A by PCR NEGATIVE NEGATIVE Final   Influenza B by PCR NEGATIVE NEGATIVE Final    Comment: (NOTE) The Xpert Xpress SARS-CoV-2/FLU/RSV plus assay is intended as an aid in the diagnosis of influenza from Nasopharyngeal swab specimens and should not be used as a sole basis for treatment.  Nasal washings and aspirates are unacceptable for Xpert Xpress SARS-CoV-2/FLU/RSV testing.  Fact Sheet for Patients: EntrepreneurPulse.com.au  Fact Sheet for Healthcare Providers: IncredibleEmployment.be  This test is not yet approved or cleared by the [place] FDA and has been authorized for detection and/or diagnosis of SARS-CoV-2 by FDA under an Emergency Use Authorization (EUA). This EUA will remain in effect (meaning this test can be used) for the duration of the COVID-19 declaration under Section 564(b)(1) of the Act,  21 U.S.C. section 360bbb-3(b)(1), unless the authorization is terminated or revoked.  Performed at [facility], [place] [address] [place], [place] [address]   Culture, blood (routine x 2)     Status: None (Preliminary result)   Collection Time: 05/16/22  5:40 PM   Specimen: BLOOD  Result Value Ref Range Status   Specimen Description   Final    BLOOD SITE NOT SPECIFIED Performed at [facility] [address] [place], [place] [address]    Special Requests   Final    BOTTLES DRAWN AEROBIC AND ANAEROBIC Blood Culture adequate volume Performed at [facility] [address] [place], [place] [address]    Culture  Setup Time   Final    GRAM POSITIVE COCCI IN CLUSTERS AEROBIC BOTTLE ONLY CRITICAL RESULT CALLED TO, READ BACK BY AND VERIFIED WITH: Dale Tarrant [phone] FCP Performed at [facility], [place] [address] [place], [place] [address]    Culture GRAM POSITIVE COCCI  Final   Report Status PENDING  Incomplete  Culture, blood (routine x 2)     Status: None (Preliminary result)   Collection Time: 05/16/22  5:40 PM   Specimen: BLOOD  Result Value Ref Range Status   Specimen Description   Final    BLOOD Performed at [facility] [address], [place], [place] [address]    Special Requests   Final    BOTTLES DRAWN AEROBIC AND ANAEROBIC  Blood Culture results may not be optimal due to an excessive volume of blood received in culture bottles Performed at [facility] [address] [place], [place] [address]    Culture  Setup Time   Final    GRAM POSITIVE COCCI IN CLUSTERS IN BOTH AEROBIC AND ANAEROBIC BOTTLES Performed at [facility], [place] [address] [place], [place] [address]    Culture GRAM POSITIVE COCCI  Final   Report Status PENDING  Incomplete  Blood Culture ID Panel (Reflexed)     Status: Abnormal   Collection Time: 05/16/22  5:40 PM  Result Value Ref Range Status   Enterococcus faecalis NOT DETECTED NOT DETECTED Final   Enterococcus Faecium NOT DETECTED NOT DETECTED Final   Listeria monocytogenes NOT DETECTED NOT DETECTED Final   Staphylococcus species DETECTED (A) NOT DETECTED Final    Comment: CRITICAL RESULT CALLED TO, READ BACK BY AND VERIFIED WITH: PHARMD ALEX L [phone] FCP    Staphylococcus aureus (BCID) DETECTED (A) NOT DETECTED Final    Comment: CRITICAL RESULT CALLED TO, READ BACK BY AND VERIFIED WITH: PHARMD ALEX L [phone] FCP    Staphylococcus epidermidis NOT DETECTED NOT DETECTED Final   Staphylococcus lugdunensis NOT DETECTED NOT DETECTED Final   Streptococcus species NOT DETECTED NOT DETECTED Final   Streptococcus agalactiae NOT DETECTED NOT DETECTED Final   Streptococcus pneumoniae NOT DETECTED NOT DETECTED Final   Streptococcus pyogenes NOT DETECTED NOT DETECTED Final   A.calcoaceticus-baumannii NOT DETECTED NOT DETECTED Final   Bacteroides fragilis NOT DETECTED NOT DETECTED Final   Enterobacterales NOT DETECTED NOT DETECTED Final   Enterobacter cloacae complex NOT DETECTED NOT DETECTED Final   Escherichia coli NOT DETECTED NOT DETECTED Final   Klebsiella aerogenes NOT DETECTED NOT DETECTED Final   Klebsiella oxytoca NOT DETECTED NOT DETECTED Final   Klebsiella pneumoniae NOT DETECTED NOT DETECTED Final   Proteus species NOT DETECTED NOT DETECTED Final    Salmonella species NOT DETECTED NOT DETECTED Final   Serratia marcescens  NOT DETECTED NOT DETECTED Final   Haemophilus influenzae NOT DETECTED NOT DETECTED Final   Neisseria meningitidis NOT DETECTED NOT DETECTED Final   Pseudomonas aeruginosa NOT DETECTED NOT DETECTED Final   Stenotrophomonas maltophilia NOT DETECTED NOT DETECTED Final   Candida albicans NOT DETECTED NOT DETECTED Final   Candida auris NOT DETECTED NOT DETECTED Final   Candida glabrata NOT DETECTED NOT DETECTED Final   Candida krusei NOT DETECTED NOT DETECTED Final   Candida parapsilosis NOT DETECTED NOT DETECTED Final   Candida tropicalis NOT DETECTED NOT DETECTED Final   Cryptococcus neoformans/gattii NOT DETECTED NOT DETECTED Final   Meth resistant mecA/C and MREJ NOT DETECTED NOT DETECTED Final    Comment: Performed at [facility], [place] [address], [place], [place] [address]    RADIOLOGY STUDIES/RESULTS: CT Head Wo Contrast  Result Date: 05/16/2022 CLINICAL DATA:  Weakness. EXAM: CT HEAD WITHOUT CONTRAST TECHNIQUE: Contiguous axial images were obtained from the base of the skull through the vertex without intravenous contrast. RADIATION DOSE REDUCTION: This exam was performed according to the departmental dose-optimization program which includes automated exposure control, adjustment of the mA and/or kV according to patient size and/or use of iterative reconstruction technique. COMPARISON:  March 09, 2014. FINDINGS: Brain: No evidence of acute infarction, hemorrhage, hydrocephalus, extra-axial collection or mass lesion/mass effect. Vascular: No hyperdense vessel or unexpected calcification. Skull: Normal. Negative for fracture or focal lesion. Sinuses/Orbits: No acute finding. Other: None. IMPRESSION: No acute intracranial abnormality seen. Electronically Signed   By: Marijo Conception M.D.   On: 05/16/2022 16:01   MR HIP LEFT WO CONTRAST  Result Date: 05/16/2022 CLINICAL DATA:  Left hip pain.  Leukocytosis. EXAM: MR  OF THE LEFT HIP WITHOUT CONTRAST TECHNIQUE: Multiplanar, multisequence MR imaging was performed. No intravenous contrast was administered. COMPARISON:  Radiographs 04/29/2022 and CT scan 07/16/2021 FINDINGS: Bones: Nonspecific abnormal edema signal in the left sacral ala, and tracking along the posterior margin of the left iliacus muscle. There is presacral edema in this region, tracking around the left sacral plexus for example on image 12 series 6. Further distally the sciatic nerve appears normal. No effusion of the SI joint; no marrow edema along the iliac side of the left SI joint. Today's exam was focused on the left hip and not on the sacrum. There is no hip effusion or marrow abnormality in the left proximal femur. Mild degenerative spurring of both femoral heads. Articular cartilage and labrum Articular cartilage:  Unremarkable Labrum:  Grossly unremarkable. Joint or bursal effusion Joint effusion:  Absent Bursae: No regional bursitis. Muscles and tendons Muscles and tendons: Small amount of abnormal edema signal anteriorly in the left gluteus maximus muscle near the left greater trochanter as shown on image 23 series 6. As noted above there is some low-grade edema tracking along the deep margin of the left iliacus muscle. Other findings Miscellaneous:   No supplemental non-categorized findings. IMPRESSION: 1. Abnormal edema in the left sacral ala is infiltrative but nonspecific. A stress fracture could potentially give this appearance and the adjacent edema tracking along the left iliacus muscle and left sacral plexus. Left sacral osteomyelitis is not totally excluded but there no effusion of the SI joint or edema along the iliac side of the SI joint to further suggest septic SI joint. 2. Low-level edema anteriorly in the gluteus maximus muscle near the greater trochanter, nonspecific but possibly reflecting a muscle strain. Electronically Signed   By: Van Clines M.D.   On: 05/16/2022 21:55  DG  Chest Port 1 View  Result Date: 05/16/2022 CLINICAL DATA:  Short of breath,  end-stage renal disease EXAM: PORTABLE CHEST 1 VIEW COMPARISON:  None Available. FINDINGS: LEFT-sided central venous line tip in the distal SVC. Stable cardiac silhouette. Mild central venous congestion pattern. Low lung volumes. IMPRESSION: Central venous congestion.  No overt pulmonary edema. Electronically Signed   By: Suzy Bouchard M.D.   On: 05/16/2022 14:58     LOS: 1 day   Oren Binet, MD  [facility]    To contact the attending provider between 7A-7P or the covering provider during after hours 7P-7A, please log into the web site [email or web address] and access using universal [place] password for that web site. If you do not have the password, please call the hospital operator.  05/17/2022, 11:22 AM

## 2022-05-17 NOTE — Consult Note (Signed)
Chief Complaint: Patient was seen in consultation today for tunneled HD catheter placement Chief Complaint  Patient presents with   Weakness   Hypotension   at the request of Dr Mickel Crow   Supervising Physician: Michaelle Birks  Patient Status: [facility] - In-pt  History of Present Illness: Whitney Walker is a 66 y.o. female   Hx anemia; COPD; Colon Ca; HTN; PVD; OSA ESRD Weak and dizzy at Oncology appt yesterday Sent to ED +BC staph aureus  In HD now Request for removal of catheter left IJ- after dialysis today (Was recently exchanged in IR 05/01/22)  Then New placement 05/20/22  Nephrology note today: Assessment/ Plan: MSSA bacteremia - w/ fall, gen'd weakness, chills. Has TDC in chest, suspected HD cath infection.  Will plan for catheter holiday, consulting IR for removal / replacement. Getting IV cefazolin.   Pt is aware and agreeable    Past Medical History:  Diagnosis Date   Anemia    Anxiety    CHF (congestive heart failure) ([place])    Colon cancer ([place])    treatment surgery   Complication of anesthesia    after first C- [place] "couldnt walk after", patient denies having a spinal   COPD (chronic obstructive pulmonary disease) ([place])    Coronary artery disease    Depression    DVT (deep venous thrombosis) ([place])    ESRD on hemodialysis ([place])    Hemo: MWF   History of blood transfusion 04/2018   Hypertension    07/07/18- no longer takes BP medications   Meningitis    Pain in limb 07/30/2013   PE (pulmonary embolism)    Peripheral vascular disease (H[facility])    Restless legs    Shortness of breath    with exertion   Sleep apnea    SOB (shortness of breath) 03/03/2019   Vertigo     Past Surgical History:  Procedure Laterality Date   ABDOMINAL AORTAGRAM N/A 07/26/2013   Procedure: ABDOMINAL Maxcine Ham;  Surgeon: [name] Sharon, MD;  Location: [facility] CATH LAB;  Service: Cardiovascular;  Laterality: N/A;   ABDOMINAL HYSTERECTOMY     AV FISTULA PLACEMENT Left  05/05/2018   Procedure: ARTERIOVENOUS (AV) FISTULA CREATION BRACHIOCEPHALIC;  Surgeon: Waynetta Sandy, MD;  Location: [place];  Service: Vascular;  Laterality: Left;   AV FISTULA PLACEMENT Left 07/16/2018   Procedure: ARTERIOVENOUS FISTULA CREATION;  Surgeon: Waynetta Sandy, MD;  Location: [place];  Service: Vascular;  Laterality: Left;   [place] Left 09/03/2018   Procedure: BASILIC VEIN TRANSPOSITION SECOND STAGE;  Surgeon: Waynetta Sandy, MD;  Location: [place];  Service: Vascular;  Laterality: Left;   BREAST BIOPSY Left    CESAREAN SECTION     X 3 1974-1977   CHOLECYSTECTOMY     CHOLECYSTECTOMY  1980/s   COLECTOMY  2010   DIVERTICULOSIS SURGERY-2002  2012   FEMORAL-POPLITEAL BYPASS GRAFT Left 08/13/2013   Procedure: BYPASS GRAFT FEMORAL-POPLITEAL ARTERY WITH NON-REVERSED SAPHANEOUS VEIN; ULTRASOUND GUIDED;  Surgeon: Mal [name], MD;  Location: [place];  Service: Vascular;  Laterality: Left;   INSERTION OF DIALYSIS CATHETER Right 04/30/2018   Procedure: INSERTION OF Right Internal Jugular DIALYSIS CATHETER;  Surgeon: [name], MD;  Location: [facility] OR;  Service: Vascular;  Laterality: Right;   IR FLUORO GUIDE CV LINE LEFT  05/01/2022   IR FLUORO GUIDE CV LINE RIGHT  07/24/2020   IR FLUORO GUIDE CV LINE RIGHT  07/21/2021   IR REMOVAL TUN CV CATH  W/O FL  07/21/2020   IR REMOVAL TUN CV CATH W/O FL  07/19/2021   IR US GUIDE VASC ACCESS RIGHT  07/24/2020   IR US GUIDE VASC ACCESS RIGHT  07/21/2021   LEFT HEART CATH AND CORONARY ANGIOGRAPHY N/A 05/01/2021   Procedure: LEFT HEART CATH AND CORONARY ANGIOGRAPHY;  Surgeon: [name], MD;  Location: [place] CV LAB;  Service: Cardiovascular;  Laterality: N/A;   LEFT HEART CATH AND CORONARY ANGIOGRAPHY N/A 05/02/2022   Procedure: LEFT HEART CATH AND CORONARY ANGIOGRAPHY;  Surgeon: [name], MD;  Location: [place] CV LAB;  Service: Cardiovascular;  Laterality: N/A;   LOWER EXTREMITY ANGIOGRAM Left 07/26/2013    Procedure: LOWER EXTREMITY ANGIOGRAM;  Surgeon: [name] Roosevelt, MD;  Location: [facility] CATH LAB;  Service: Cardiovascular;  Laterality: Left;    Allergies: Carnosine, Gadolinium derivatives, Iohexol, Iodinated contrast media, Iodine, and Naltrexone  Medications: Prior to Admission medications   Medication Sig Start Date End Date Taking? Authorizing Provider  acetaminophen (TYLENOL) 650 MG CR tablet Take 1,950-2,600 mg by mouth 2 (two) times daily as needed for pain. Do not exceed more than '4000mg'$  (or 6 capsules) in 24hrs.   Yes [provider]  albuterol (VENTOLIN HFA) 108 (90 Base) MCG/ACT inhaler Inhale 2 puffs into the lungs every 6 (six) hours as needed for wheezing or shortness of breath. 08/03/20  Yes [name], FNP  Ascorbic Acid (VITAMIN C) 100 MG tablet Take 100 mg by mouth daily.   Yes [provider]  aspirin 81 MG chewable tablet Chew 1 tablet (81 mg total) by mouth daily. 05/03/22  Yes Ghimire, Henreitta Leber, MD  atorvastatin (LIPITOR) 10 MG tablet TAKE ONE TABLET BY MOUTH ONCE DAILY Patient taking differently: Take 10 mg by mouth every evening. 05/13/22  Yes Cantwell, Celeste C, PA-C  AURYXIA 1 GM 210 MG(Fe) tablet Take 420-840 mg by mouth See admin instructions. Take '840mg'$  by mouth three times daily with meals and '420mg'$  with snacks. 05/15/21  Yes [provider]  Biotin 10000 MCG TABS Take 10,000 mcg by mouth daily.    Yes [provider]  ELIQUIS 2.5 MG TABS tablet TAKE ONE TABLET BY MOUTH TWICE DAILY Patient taking differently: Take 2.5 mg by mouth 2 (two) times daily. 04/15/22  Yes [name], FNP  gabapentin (NEURONTIN) 300 MG capsule Take 300 mg by mouth daily. 05/06/22  Yes [provider]  meclizine (ANTIVERT) 12.5 MG tablet Take 1 tablet (12.5 mg total) by mouth 3 (three) times daily as needed for dizziness. 04/12/21  Yes Glendale Chard, MD  metoprolol succinate (TOPROL-XL) 25 MG 24 hr tablet Take 1 tablet (25 mg total) by mouth daily.  05/03/22  Yes Ghimire, Henreitta Leber, MD  midodrine (PROAMATINE) 10 MG tablet Take 5 mg by mouth every Monday, Wednesday, and Friday with hemodialysis. 04/17/22  Yes [provider]  multivitamin (RENA-VIT) TABS tablet Take 1 tablet by mouth every evening. 04/01/22  Yes [provider]  omeprazole (PRILOSEC) 20 MG capsule TAKE ONE CAPSULE BY MOUTH ONCE DAILY Patient taking differently: Take 20 mg by mouth daily. 04/26/22  Yes Truitt Merle, MD  oxyCODONE-acetaminophen (PERCOCET) 5-325 MG tablet Take 1 tablet by mouth every 4 (four) hours as needed for severe pain. 08/09/21  Yes [name], FNP  vitamin E 200 UNIT capsule Take 200 Units by mouth daily.   Yes [provider]  cyclobenzaprine (FLEXERIL) 10 MG tablet Take 1 tablet (10 mg total) by mouth 3 (three) times daily as needed  for muscle spasms. Patient not taking: Reported on 05/16/2022 11/15/21   [name], FNP     Family History  Problem Relation Age of Onset   Cancer Mother 56       breast and bone   Cancer Father 44       prostate   Hypertension Sister    Bleeding Disorder Sister    Cancer Cousin 33       breast cancer    Hypertension Daughter    Breast cancer Neg Hx     Social History   Socioeconomic History   Marital status: Single    Spouse name: Not on file   Number of children: 4   Years of education: 12th gd   Highest education level: Not on file  Occupational History   Occupation: N/A  Tobacco Use   Smoking status: Some Days    Packs/day: 0.50    Years: 50.00    Pack years: 25.00    Types: Cigarettes    Last attempt to quit: 02/03/2021    Years since quitting: 1.2   Smokeless tobacco: Former   Tobacco comments:    She is using a Mining engineer Use: Every day   Devices: Vuse  Substance and Sexual Activity   Alcohol use: No    Alcohol/week: 0.0 standard drinks    Comment: she used to drink alcohol, quit in 2010; h/o heavy use    Drug use: Not Currently    Types: Oxycodone    Sexual activity: Not Currently  Other Topics Concern   Not on file  Social History Narrative   2 cups of coffee a day    Social Determinants of Health   Financial Resource Strain: Low Risk    Difficulty of Paying Living Expenses: Not hard at all  Food Insecurity: No Food Insecurity   Worried About Charity fundraiser in the Last Year: Never true   [place] in the Last Year: Never true  Transportation Needs: No Transportation Needs   Lack of Transportation (Medical): No   Lack of Transportation (Non-Medical): No  Physical Activity: Inactive   Days of Exercise per Week: 0 days   Minutes of Exercise per Session: 0 min  Stress: Stress Concern Present   Feeling of Stress : To some extent  Social Connections: Not on file    Review of Systems: A 12 point ROS discussed and pertinent positives are indicated in the HPI above.  All other systems are negative.  Vital Signs: BP (!) 118/51   Pulse (!) 104   Temp 98 F (36.7 C) (Oral)   Resp 15   Ht '5\' 2"'$  (1.575 m)   Wt 197 lb 12 oz (89.7 kg)   SpO2 93%   BMI 36.17 kg/m   Physical Exam Vitals reviewed.  HENT:     Mouth/Throat:     Mouth: Mucous membranes are moist.  Cardiovascular:     Rate and Rhythm: Normal rate and regular rhythm.     Heart sounds: Normal heart sounds.  Pulmonary:     Effort: Pulmonary effort is normal.     Breath sounds: Normal breath sounds.  Abdominal:     Palpations: Abdomen is soft.  Skin:    General: Skin is warm.  Neurological:     Mental Status: She is alert and oriented to person, place, and time.  Psychiatric:        Behavior: Behavior normal.    Imaging: DG  Chest 2 View  Result Date: 05/01/2022 CLINICAL DATA:  Chest pain, nonradiating chest pain in a 66 year old female. EXAM: CHEST - 2 VIEW COMPARISON:  July 17, 2021. FINDINGS: LEFT-sided dialysis catheter terminates in the RIGHT atrium after air via LEFT IJ approach. Trachea is midline. Cardiomediastinal contours and hilar  structures with mild cardiac enlargement and central pulmonary vascular engorgement similar to the prior study. No frank edema. No consolidation. No sign of pleural effusion. On limited assessment there is no acute skeletal process. IMPRESSION: Mild cardiac enlargement and central pulmonary vascular engorgement. Electronically Signed   By: Zetta Bills M.D.   On: 05/01/2022 08:31   CT Head Wo Contrast  Result Date: 05/16/2022 CLINICAL DATA:  Weakness. EXAM: CT HEAD WITHOUT CONTRAST TECHNIQUE: Contiguous axial images were obtained from the base of the skull through the vertex without intravenous contrast. RADIATION DOSE REDUCTION: This exam was performed according to the departmental dose-optimization program which includes automated exposure control, adjustment of the mA and/or kV according to patient size and/or use of iterative reconstruction technique. COMPARISON:  March 09, 2014. FINDINGS: Brain: No evidence of acute infarction, hemorrhage, hydrocephalus, extra-axial collection or mass lesion/mass effect. Vascular: No hyperdense vessel or unexpected calcification. Skull: Normal. Negative for fracture or focal lesion. Sinuses/Orbits: No acute finding. Other: None. IMPRESSION: No acute intracranial abnormality seen. Electronically Signed   By: Marijo Conception M.D.   On: 05/16/2022 16:01   CARDIAC CATHETERIZATION  Addendum Date: 05/02/2022   Left Heart Catheterization 05/02/22: RCA: Dominant.  Mild diffuse disease. Left main: Large vessel, very short and immediately bifurcates.  Calcified. LCx: Large vessel, [place] to a very high large OM1, a moderate-sized OM 2 distally, mild to moderate diffuse disease is present.  No high-grade stenosis. LAD: Large vessel, gives coronary to a very large D1, mild diffuse disease again evident without any focal high-grade stenosis.  Diffuse coronary calcification is again evident.  LAD is tortuous in the mid to distal segment suggestive of hypertensive heart disease.  Recommendation: [name] positive cardiac markers probably related to very small vessel disease.  Do not see any obvious dissection in the major coronary vessels or thrombus formation or occlusion.  No significant change in coronary anatomy since prior angiography a year ago in 2022.  Recommend continuing medical therapy.  30 mL contrast utilized.  Result Date: 05/02/2022 Left Heart Catheterization 05/02/22: RCA: Dominant.  Mild diffuse disease. Left main: Large vessel, very short and immediately bifurcates.  Calcified. LCx: Large vessel, [place] to a very high large OM1, a moderate-sized OM 2 distally, mild to moderate diffuse disease is present.  No high-grade stenosis. LAD: Large vessel, gives coronary to a very large D1, mild diffuse disease again evident without any focal high-grade stenosis.  Diffuse coronary calcification is again evident.  LAD is tortuous in the mid to distal segment suggestive of hypertensive heart disease. Recommendation: [name] positive cardiac markers probably related to very small vessel disease.  Do not see any obvious dissection in the major coronary vessels or thrombus formation or occlusion.  No significant change in coronary anatomy since prior angiography a year ago in 2022.  Recommend continuing medical therapy.  30 mL contrast utilized.   MR HIP LEFT WO CONTRAST  Result Date: 05/16/2022 CLINICAL DATA:  Left hip pain.  Leukocytosis. EXAM: MR OF THE LEFT HIP WITHOUT CONTRAST TECHNIQUE: Multiplanar, multisequence MR imaging was performed. No intravenous contrast was administered. COMPARISON:  Radiographs 04/29/2022 and CT scan 07/16/2021 FINDINGS: Bones: Nonspecific abnormal edema signal in the left sacral  ala, and tracking along the posterior margin of the left iliacus muscle. There is presacral edema in this region, tracking around the left sacral plexus for example on image 12 series 6. Further distally the sciatic nerve appears normal. No effusion of the SI joint; no  marrow edema along the iliac side of the left SI joint. Today's exam was focused on the left hip and not on the sacrum. There is no hip effusion or marrow abnormality in the left proximal femur. Mild degenerative spurring of both femoral heads. Articular cartilage and labrum Articular cartilage:  Unremarkable Labrum:  Grossly unremarkable. Joint or bursal effusion Joint effusion:  Absent Bursae: No regional bursitis. Muscles and tendons Muscles and tendons: Small amount of abnormal edema signal anteriorly in the left gluteus maximus muscle near the left greater trochanter as shown on image 23 series 6. As noted above there is some low-grade edema tracking along the deep margin of the left iliacus muscle. Other findings Miscellaneous:   No supplemental non-categorized findings. IMPRESSION: 1. Abnormal edema in the left sacral ala is infiltrative but nonspecific. A stress fracture could potentially give this appearance and the adjacent edema tracking along the left iliacus muscle and left sacral plexus. Left sacral osteomyelitis is not totally excluded but there no effusion of the SI joint or edema along the iliac side of the SI joint to further suggest septic SI joint. 2. Low-level edema anteriorly in the gluteus maximus muscle near the greater trochanter, nonspecific but possibly reflecting a muscle strain. Electronically Signed   By: Van Clines M.D.   On: 05/16/2022 21:55   IR Fluoro Guide CV Line Left  Result Date: 05/02/2022 INDICATION: 66 year old female with end-stage renal disease on hemodialysis has a malfunctioning left IJ tunneled dialysis catheter EXAM: XA - EXTERNAL REPAIR CV CATH MEDICATIONS: None; The antibiotic was administered within an appropriate time interval prior to skin puncture. ANESTHESIA/SEDATION: Procedure was performed under local anesthesia FLUOROSCOPY: Radiation Exposure Index (as provided by the fluoroscopic device): 84.6 mGy Kerma COMPLICATIONS: None immediate. PROCEDURE:  Informed written consent was obtained from the patient after a thorough discussion of the procedural risks, benefits and alternatives. All questions were addressed. Maximal Sterile Barrier Technique was utilized including caps, mask, sterile gowns, sterile gloves, sterile drape, hand hygiene and skin antiseptic. A timeout was performed prior to the initiation of the procedure. Existing left IJ tunneled catheter site was prepped and draped in sterile fashion. 1% xylocaine was administered for local anesthesia. Then, with blunt and sharp dissection, the cuff of the catheter was released from the surrounding tissue, 035 inch stiff Glidewire was inserted, the catheter was removed and a new 14.5 [place] by 23 cm palindrome catheter was advanced over the Glidewire, to place the tip of the catheter at the caval atrial junction. Both the ports of the catheter aspirated and flushed briskly. The ports were flushed with saline and heparin, the catheter was anchored to the skin using 0 Prolene and a sterile dressing including a Biopatch was placed. Fluoroscopic spot images obtained. IMPRESSION: Fluoroscopic guided left transjugular tunneled dialysis catheter exchange. Electronically Signed   By: Frazier Richards M.D.   On: 05/02/2022 08:48   DG Chest Port 1 View  Result Date: 05/16/2022 CLINICAL DATA:  Short of breath,  end-stage renal disease EXAM: PORTABLE CHEST 1 VIEW COMPARISON:  None Available. FINDINGS: LEFT-sided central venous line tip in the distal SVC. Stable cardiac silhouette. Mild central venous congestion pattern. Low lung volumes. IMPRESSION: Central venous congestion.  No overt pulmonary  edema. Electronically Signed   By: Suzy Bouchard M.D.   On: 05/16/2022 14:58   DG Chest Port 1 View  Result Date: 05/02/2022 CLINICAL DATA:  Chest pain. EXAM: PORTABLE CHEST 1 VIEW COMPARISON:  Chest x-ray 07/17/2021. FINDINGS: Left-sided central venous catheter tip projects over the distal SVC. The heart is mildly  enlarged, unchanged. There is minimal left basilar atelectasis. There is no pleural effusion or pneumothorax. No acute fractures are seen. The heart size and mediastinal contours are within normal limits. Both lungs are clear. The visualized skeletal structures are unremarkable. IMPRESSION: No active disease.  Stable cardiomegaly. Electronically Signed   By: Ronney Asters M.D.   On: 05/02/2022 00:06   DG Hip Unilat W or Wo Pelvis 2-3 Views Left  Result Date: 04/29/2022 CLINICAL DATA:  Left-sided pain EXAM: DG HIP (WITH OR WITHOUT PELVIS) 2-3V LEFT COMPARISON:  None. FINDINGS: SI joints are non widened. Pubic symphysis is intact. There are vascular calcifications. No fracture or malalignment. IMPRESSION: No acute osseous abnormality. Electronically Signed   By: Donavan Foil M.D.   On: 04/29/2022 18:45    Labs:  CBC: Recent Labs    05/01/22 0810 05/01/22 2219 05/03/22 0118 05/16/22 1407 05/17/22 0047  WBC 12.7*  --  9.1 25.1* 22.0*  HGB 13.1 12.6 11.4* 10.9* 10.8*  HCT 42.1 37.0 36.6 33.4* 32.6*  PLT 167  --  139* 269 192    COAGS: Recent Labs    05/28/21 0842 07/16/21 1402 07/17/21 0408 07/17/21 1414 05/16/22 1407  INR 1.2 1.3*  --   --  1.2  APTT  --  24 41* 45*  --     BMP: Recent Labs    05/01/22 1357 05/01/22 2219 05/03/22 0118 05/16/22 1407 05/17/22 0047  NA 132* 136 132* 134* 130*  K 7.2* 3.8 5.7* 5.1 5.4*  CL 103 101 99 101 100  CO2 13*  --  20* 20* 15*  GLUCOSE 60* 60* 219* 122* 111*  BUN 74* 27* 41* 41* 47*  CALCIUM 7.9*  --  7.9* 8.8* 8.6*  CREATININE 15.55* 7.90* 10.62* 8.21* 9.04*  GFRNONAA 2*  --  4* 5* 4*    LIVER FUNCTION TESTS: Recent Labs    07/16/21 1402 07/18/21 1430 11/15/21 1145 05/03/22 0118 05/16/22 1407 05/17/22 0047  BILITOT 1.2  --  0.3  --  0.6 0.8  AST 39  --  30  --  21 25  ALT 21  --  29  --  16 14  ALKPHOS 93  --  196*  --  109 89  PROT 7.8  --  7.4  --  7.4 6.6  ALBUMIN 2.7*   < > 3.9 2.9* 2.5* 2.0*   < > = values in  this interval not displayed.    TUMOR MARKERS: No results for input(s): AFPTM, CEA, CA199, CHROMGRNA in the last 8760 hours.  Assessment and Plan:  ESRD Bacteremia +staph aureus For tunn HD removal after dialysis today New cath placement 5/22 Risks and benefits discussed with the patient including, but not limited to bleeding, infection, vascular injury, pneumothorax which may require chest tube placement, air embolism or even death  All of the patient's questions were answered, patient is agreeable to proceed. Consent signed and in chart.    Thank you for this interesting consult.  I greatly enjoyed meeting Whitney Walker and look forward to participating in their care.  A copy of this report was sent to the requesting provider on this date.  Electronically Signed: Lavonia Drafts, PA-C 05/17/2022, 1:50 PM   I spent a total of 20 Minutes    in face to face in clinical consultation, greater than 50% of which was counseling/coordinating care for tunneled dialysis catheter placement

## 2022-05-17 NOTE — Procedures (Signed)
   I was present at this dialysis session, have reviewed the session itself and made  appropriate changes Kelly Splinter MD [place] pager [phone]   05/17/2022, 12:35 PM

## 2022-05-17 NOTE — Evaluation (Signed)
Occupational Therapy Evaluation Patient Details Name: Whitney Walker MRN: [id number] DOB: Mar 05, 1956 Today's Date: 05/17/2022   History of Present Illness Pt is a 66 y.o. female admitted 5/18 with diagnosis of hypotension and fall. PMH: ESRD on HD MWF, COPD, CAD, DVT on chronic anticoagulation, HTN, OA with chronic left hip pain, depression, history of PE.   Clinical Impression   Pt lived alone, walked with a rollator in her home and was independent in ADLs and IADLs prior to admission. She was getting down in the tub to protect her HD catheter from getting wet during bathing. Pt presents with generalized weakness and impaired standing balance. She requires min assist for bed mobility and to stand, min guard assist with RW to take steps and set up to min assist for ADLs. Pt limited by L hip pain. Recommending HHOT upon discharge, will follow acutely.      Recommendations for follow up therapy are one component of a multi-disciplinary discharge planning process, led by the attending physician.  Recommendations may be updated based on patient status, additional functional criteria and insurance authorization.   Follow Up Recommendations  Home health OT    Assistance Recommended at Discharge Frequent or constant Supervision/Assistance  Patient can return home with the following A little help with walking and/or transfers;A little help with bathing/dressing/bathroom;Assist for transportation;Help with stairs or ramp for entrance;Assistance with cooking/housework    Functional Status Assessment  Patient has had a recent decline in their functional status and demonstrates the ability to make significant improvements in function in a reasonable and predictable amount of time.  Equipment Recommendations  BSC/3in1    Recommendations for Other Services       Precautions / Restrictions Precautions Precautions: Fall;Other (comment) Precaution Comments: watch BP Restrictions Weight Bearing  Restrictions: No      Mobility Bed Mobility Overal bed mobility: Needs Assistance Bed Mobility: Supine to Sit, Sit to Supine     Supine to sit: Min assist Sit to supine: Min guard   General bed mobility comments: assist to raise trunk    Transfers Overall transfer level: Needs assistance Equipment used: Rolling walker (2 wheels) Transfers: Sit to/from Stand Sit to Stand: Min assist           General transfer comment: min assist to rise, cues for hand placement      Balance Overall balance assessment: Needs assistance Sitting-balance support: No upper extremity supported, Feet supported Sitting balance-Leahy Scale: Good     Standing balance support: Bilateral upper extremity supported Standing balance-Leahy Scale: Poor Standing balance comment: reliant on RW for dynamic balance, fair statically                           ADL either performed or assessed with clinical judgement   ADL Overall ADL's : Needs assistance/impaired Eating/Feeding: Independent   Grooming: Set up;Sitting   Upper Body Bathing: Set up;Sitting   Lower Body Bathing: Minimal assistance;Sit to/from stand   Upper Body Dressing : Set up;Sitting   Lower Body Dressing: Minimal assistance;Sit to/from stand   Toilet Transfer: Min guard;Ambulation;Rolling walker (2 wheels)   Toileting- Clothing Manipulation and Hygiene: Minimal assistance;Sit to/from stand       Functional mobility during ADLs: Min guard;Rolling walker (2 wheels)       Vision Ability to See in Adequate Light: 0 Adequate       Perception     Praxis      Pertinent Vitals/Pain Pain Assessment Pain  Assessment: Faces Faces Pain Scale: Hurts even more Pain Location: L hip Pain Descriptors / Indicators: Discomfort, Grimacing, Sore Pain Intervention(s): Monitored during session, Repositioned     Hand Dominance Right   Extremity/Trunk Assessment Upper Extremity Assessment Upper Extremity Assessment:  Generalized weakness;Overall [facility] for tasks assessed   Lower Extremity Assessment Lower Extremity Assessment: Defer to PT evaluation   Cervical / Trunk Assessment Cervical / Trunk Assessment: Other exceptions (obesity)   Communication Communication Communication: No difficulties   Cognition Arousal/Alertness: Awake/alert Behavior During Therapy: Anxious (tearful upon arrival) Overall Cognitive Status: Within Functional Limits for tasks assessed                                 General Comments: pt tearful having been just told she needed surgery to replace her current HD catheter     General Comments      Exercises     Shoulder Instructions      Home Living Family/patient expects to be discharged to:: Private residence Living Arrangements: Alone Available Help at Discharge: Family;Available PRN/intermittently Type of Home: Apartment Home Access: Stairs to enter Entrance Stairs-Number of Steps: 1 (threshold) Entrance Stairs-Rails: Right;Left Home Layout: One level     Bathroom Shower/Tub: Tub/shower unit;Curtain   Bathroom Toilet: Handicapped height Bathroom Accessibility: Yes   Home Equipment: Advice worker (2 wheels);Rollator (4 wheels)   Additional Comments: states shower seat does not fit in her tub, tub soaks rather than shower due to HD cath      Prior Functioning/Environment Prior Level of Function : Independent/Modified Independent             Mobility Comments: uses motorized scooter in grocery store, rollator in home ADLs Comments: sits down in tub to bathe due to HD catheter, independent in self care and IADLs        OT Problem List: Decreased strength;Impaired balance (sitting and/or standing);Decreased knowledge of use of DME or AE;Pain;Obesity      OT Treatment/Interventions: Self-care/ADL training;DME and/or AE instruction;Therapeutic activities;Patient/family education;Balance training    OT Goals(Current goals  can be found in the care plan section) Acute Rehab OT Goals OT Goal Formulation: With patient Time For Goal Achievement: 05/31/22 Potential to Achieve Goals: Good ADL Goals Pt Will Perform Grooming: with modified independence;standing Pt Will Perform Lower Body Bathing: with modified independence;sit to/from stand Pt Will Perform Lower Body Dressing: with modified independence;sit to/from stand Pt Will Transfer to Toilet: with modified independence;bedside commode;ambulating Pt Will Perform Toileting - Clothing Manipulation and hygiene: with modified independence;sit to/from stand  OT Frequency: Min 2X/week    Co-evaluation              AM-PAC OT "6 Clicks" Daily Activity     Outcome Measure Help from another person eating meals?: None Help from another person taking care of personal grooming?: A Little Help from another person toileting, which includes using toliet, bedpan, or urinal?: A Little Help from another person bathing (including washing, rinsing, drying)?: A Little Help from another person to put on and taking off regular upper body clothing?: A Little Help from another person to put on and taking off regular lower body clothing?: A Little 6 Click Score: 19   End of Session Equipment Utilized During Treatment: Rolling walker (2 wheels)  Activity Tolerance: Patient tolerated treatment well Patient left: in bed;with call bell/phone within reach;Other (comment) (going to HD)  OT Visit Diagnosis: Unsteadiness on feet (R26.81);Other abnormalities  of gait and mobility (R26.89);Pain;Muscle weakness (generalized) (M62.81) Pain - Right/Left: Left Pain - part of body: Hip                Time: 1624-4695 OT Time Calculation (min): 16 min Charges:  OT General Charges $OT Visit: 1 Visit OT Evaluation $OT Eval Moderate Complexity: 1 Mod  Nestor Lewandowsky, OTR/L Acute Rehabilitation Services Pager: [phone] Office: [phone]   Malka So 05/17/2022, 1:12  PM

## 2022-05-18 ENCOUNTER — Inpatient Hospital Stay (HOSPITAL_COMMUNITY): Payer: Medicare Other

## 2022-05-18 DIAGNOSIS — I5032 Chronic diastolic (congestive) heart failure: Secondary | ICD-10-CM | POA: Diagnosis not present

## 2022-05-18 DIAGNOSIS — W19XXXA Unspecified fall, initial encounter: Secondary | ICD-10-CM | POA: Diagnosis not present

## 2022-05-18 LAB — RENAL FUNCTION PANEL
Albumin: 2 g/dL — ABNORMAL LOW (ref 3.5–5.0)
Anion gap: 13 (ref 5–15)
BUN: 24 mg/dL — ABNORMAL HIGH (ref 8–23)
CO2: 20 mmol/L — ABNORMAL LOW (ref 22–32)
Calcium: 8.4 mg/dL — ABNORMAL LOW (ref 8.9–10.3)
Chloride: 102 mmol/L (ref 98–111)
Creatinine, Ser: 5.49 mg/dL — ABNORMAL HIGH (ref 0.44–1.00)
GFR, Estimated: 8 mL/min — ABNORMAL LOW (ref >60)
Glucose, Bld: 100 mg/dL — ABNORMAL HIGH (ref 70–99)
Phosphorus: 2.9 mg/dL (ref 2.5–4.6)
Potassium: 5 mmol/L (ref 3.5–5.1)
Sodium: 135 mmol/L (ref 135–145)

## 2022-05-18 LAB — CBC
HCT: 31.3 % — ABNORMAL LOW (ref 36.0–46.0)
Hemoglobin: 9.8 g/dL — ABNORMAL LOW (ref 12.0–15.0)
MCH: 29.9 pg (ref 26.0–34.0)
MCHC: 31.3 g/dL (ref 30.0–36.0)
MCV: 95.4 fL (ref 80.0–100.0)
Platelets: 250 10*3/uL (ref 150–400)
RBC: 3.28 MIL/uL — ABNORMAL LOW (ref 3.87–5.11)
RDW: 15.7 % — ABNORMAL HIGH (ref 11.5–15.5)
WBC: 16.9 10*3/uL — ABNORMAL HIGH (ref 4.0–10.5)
nRBC: 0 % (ref 0.0–0.2)

## 2022-05-18 MED ORDER — HYDROMORPHONE HCL 1 MG/ML IJ SOLN
0.5000 mg | INTRAMUSCULAR | Status: DC | PRN
  Administered 2022-05-18 – 2022-05-21 (×7): 0.5 mg via INTRAVENOUS
  Filled 2022-05-18 (×7): qty 0.5

## 2022-05-18 NOTE — Progress Notes (Addendum)
PROGRESS NOTE        PATIENT DETAILS Name: Whitney Walker Age: 66 y.o. Sex: female Date of Birth: 06-27-1956 Admit Date: 05/16/2022 Admitting Physician Donne Hazel, MD PJK:DTOIZ, Doreene Burke, FNP  Brief Summary: Patient is a 66 y.o.  female with history of ESRD on HD MWF, VTE on Eliquis, chronic left hip pain-referred to the ED by oncologist for evaluation of tension/chills and a mechanical fall.  She was found to have significant leukocytosis-and subsequently admitted to the hospitalist service.   Post admission-blood cultures (prelim) with positive for MSSA.  Significant events: 5/3-5/5>> Hospitalized for malfunctioning HD catheter (exchanged by IR) and NSTEMI-LHC with small vessel disease 5/18>>Admit for chills/hypotension/mechanical fall-blood cultures +ve for MSSA  Significant studies: 5/18>> CXR: No PNA 5/18>> CT head: No acute intracranial abnormality 5/18>> MRI left hip: Abnormal edema in the left sacral alla-possible infiltrative-possible stress fracture.  Significant microbiology data: 5/18>> COVID/influenza PCR: Negative 5/18>> blood culture: Gram-positive cocci (MSSA on BCID)  Procedures: 5/3>> HD catheter exchanged by IR. 5/4>> left heart catheterization by Dr. Einar Gip  Consults: ID, nephrology, [facility] (msg left with office)  Subjective:   Patient in bed, appears in mild distress, ++ L Hip pain, denies any headache, no fever, no chest pain or pressure, no shortness of breath , no abdominal pain. No new focal weakness.   Objective: Vitals: Blood pressure 109/71, pulse 96, temperature 98.7 F (37.1 C), temperature source Oral, resp. rate 17, height '5\' 2"'$  (1.575 m), weight 89.9 kg, SpO2 91 %.   Exam:  Awake Alert, No new F.N deficits, Normal affect [place].AT,PERRAL Supple Neck, No JVD,   Symmetrical Chest wall movement, Good air movement bilaterally, CTAB RRR,No Gallops, Rubs or new Murmurs,  +ve B.Sounds, Abd Soft, No tenderness,    No Cyanosis, Clubbing or edema    Assessment/Plan:  Sepsis due to MSSA bacteremia: BP improved-culture data as above-obtain echo. She does have a HD catheter-catheter site appears benign without any obvious discharge.  HD catheter was recently exchanged by IR on 5/3 during her prior hospitalization.  Catheter again removed on 05/17/2022 by IR with possible new catheter placement on 05/20/2022, ID and nephrology directing catheter care and placement, also has questionable left hip osteomyelitis for which orthopedics has been consulted and IR requested to aspirate if any fluid available.  Acute on chronic left-sided hip pain: Left hip MRI noted with possible left hip stress fracture, muscle strain and possible osteomyelitis/infection.  Discussed with orthopedics Dr. Mable Fill who will see the patient, IR also requested to aspirate if any fluid available, appropriate fluid testing ordered.  Continue supportive care with for pain.  Hypotension: Resolved-suspect this was likely due to sepsis/MSSA bacteremia-in recent diarrheal illness during Mother's Day weekend (resolved).  ESRD on HD MWF: Nephrology following.  Normocytic anemia: Due to ESRD-defer Aranesp/IV iron to nephrology.  Hyperkalemia: Mild-for HD later today.  Repeat electrolytes tomorrow.  History of recurrent VTE: On Eliquis.  Recent non-STEMI: No chest pain-recent LHC negative for large vessel disease-suspected to have small vessel disease.  Cardiology recommended aspirin x30 days along with Eliquis.  Chronic HFpEF: Euvolemic-volume removal with HD  HTN: BP soft-continue to hold Coreg  HLD: On statin  History of PAD s/p left femoral popliteal bypass in 2014  History of colon cancer: Follows with oncology/Dr. Collene Mares in the outpatient setting-in remission   OSA: Apparently not on CPAP  at home.  Tobacco abuse: Counseled  Obesity: Estimated body mass index is 36.25 kg/m as calculated from the following:   Height as of this  encounter: '5\' 2"'$  (1.575 m).   Weight as of this encounter: 89.9 kg.   Code status:   Code Status: Full Code   DVT Prophylaxis: apixaban (ELIQUIS) tablet 2.5 mg Start: 05/16/22 2200 apixaban (ELIQUIS) tablet 2.5 mg    Family Communication: Daughter-Laticia [phone]-left voicemail on 5/19   Disposition Plan: Status is: Inpatient Remains inpatient appropriate because: Sepsis due to MSSA bacteremia-noted stable for discharge.   Planned Discharge Destination:Home   Diet: Diet Order             Diet NPO time specified Except for: Sips with Meds  Diet effective midnight           Diet renal with fluid restriction Fluid restriction: 1200 mL Fluid; Room service appropriate? Yes; Fluid consistency: Thin  Diet effective now                     Antimicrobial agents: Anti-infectives (From admission, onward)    Start     Dose/Rate Route Frequency Ordered Stop   05/17/22 1115  ceFAZolin (ANCEF) IVPB 1 g/50 mL premix        1 g 100 mL/hr over 30 Minutes Intravenous Every 24 hours 05/17/22 1023     05/16/22 1715  vancomycin (VANCOREADY) IVPB 2000 mg/400 mL        2,000 mg 200 mL/hr over 120 Minutes Intravenous  Once 05/16/22 1701 05/16/22 2146   05/16/22 1715  ceFEPIme (MAXIPIME) 2 g in sodium chloride 0.9 % 100 mL IVPB        2 g 200 mL/hr over 30 Minutes Intravenous  Once 05/16/22 1701 05/16/22 1847        MEDICATIONS: Scheduled Meds:  apixaban  2.5 mg Oral BID   aspirin  81 mg Oral Daily   atorvastatin  10 mg Oral QPM   Chlorhexidine Gluconate Cloth  6 each Topical Q0600   ferric citrate  840 mg Oral TID WC   gabapentin  300 mg Oral Daily   midodrine  5 mg Oral Q M,W,F-HD   pantoprazole  40 mg Oral Daily   Continuous Infusions:   ceFAZolin (ANCEF) IV 1 g (05/17/22 1712)   PRN Meds:.acetaminophen **OR** acetaminophen, albuterol, ferric citrate, HYDROmorphone (DILAUDID) injection, oxyCODONE   I have personally reviewed following labs and imaging  studies  LABORATORY DATA: Recent Labs  Lab 05/16/22 1407 05/17/22 0047 05/18/22 0433  WBC 25.1* 22.0* 16.9*  HGB 10.9* 10.8* 9.8*  HCT 33.4* 32.6* 31.3*  PLT 269 192 250  MCV 95.2 92.1 95.4  MCH 31.1 30.5 29.9  MCHC 32.6 33.1 31.3  RDW 15.9* 15.4 15.7*    Recent Labs  Lab 05/16/22 1407 05/16/22 1740 05/17/22 0047 05/18/22 0433  NA 134*  --  130* 135  K 5.1  --  5.4* 5.0  CL 101  --  100 102  CO2 20*  --  15* 20*  GLUCOSE 122*  --  111* 100*  BUN 41*  --  47* 24*  CREATININE 8.21*  --  9.04* 5.49*  CALCIUM 8.8*  --  8.6* 8.4*  AST 21  --  25  --   ALT 16  --  14  --   ALKPHOS 109  --  89  --   BILITOT 0.6  --  0.8  --   ALBUMIN 2.5*  --  2.0* 2.0*  PHOS  --   --   --  2.9  LATICACIDVEN  --  1.1 1.1  --   INR 1.2  --   --   --     RADIOLOGY STUDIES/RESULTS: CT Head Wo Contrast  Result Date: 05/16/2022 CLINICAL DATA:  Weakness. EXAM: CT HEAD WITHOUT CONTRAST TECHNIQUE: Contiguous axial images were obtained from the base of the skull through the vertex without intravenous contrast. RADIATION DOSE REDUCTION: This exam was performed according to the departmental dose-optimization program which includes automated exposure control, adjustment of the mA and/or kV according to patient size and/or use of iterative reconstruction technique. COMPARISON:  March 09, 2014. FINDINGS: Brain: No evidence of acute infarction, hemorrhage, hydrocephalus, extra-axial collection or mass lesion/mass effect. Vascular: No hyperdense vessel or unexpected calcification. Skull: Normal. Negative for fracture or focal lesion. Sinuses/Orbits: No acute finding. Other: None. IMPRESSION: No acute intracranial abnormality seen. Electronically Signed   By: Marijo Conception M.D.   On: 05/16/2022 16:01   MR HIP LEFT WO CONTRAST  Result Date: 05/16/2022 CLINICAL DATA:  Left hip pain.  Leukocytosis. EXAM: MR OF THE LEFT HIP WITHOUT CONTRAST TECHNIQUE: Multiplanar, multisequence MR imaging was performed. No  intravenous contrast was administered. COMPARISON:  Radiographs 04/29/2022 and CT scan 07/16/2021 FINDINGS: Bones: Nonspecific abnormal edema signal in the left sacral ala, and tracking along the posterior margin of the left iliacus muscle. There is presacral edema in this region, tracking around the left sacral plexus for example on image 12 series 6. Further distally the sciatic nerve appears normal. No effusion of the SI joint; no marrow edema along the iliac side of the left SI joint. Today's exam was focused on the left hip and not on the sacrum. There is no hip effusion or marrow abnormality in the left proximal femur. Mild degenerative spurring of both femoral heads. Articular cartilage and labrum Articular cartilage:  Unremarkable Labrum:  Grossly unremarkable. Joint or bursal effusion Joint effusion:  Absent Bursae: No regional bursitis. Muscles and tendons Muscles and tendons: Small amount of abnormal edema signal anteriorly in the left gluteus maximus muscle near the left greater trochanter as shown on image 23 series 6. As noted above there is some low-grade edema tracking along the deep margin of the left iliacus muscle. Other findings Miscellaneous:   No supplemental non-categorized findings. IMPRESSION: 1. Abnormal edema in the left sacral ala is infiltrative but nonspecific. A stress fracture could potentially give this appearance and the adjacent edema tracking along the left iliacus muscle and left sacral plexus. Left sacral osteomyelitis is not totally excluded but there no effusion of the SI joint or edema along the iliac side of the SI joint to further suggest septic SI joint. 2. Low-level edema anteriorly in the gluteus maximus muscle near the greater trochanter, nonspecific but possibly reflecting a muscle strain. Electronically Signed   By: Van Clines M.D.   On: 05/16/2022 21:55   IR Removal Tun Cv Cath W/O FL  Result Date: 05/17/2022 INDICATION: Patient history of end-stage renal  disease on hemodialysis now admitted with bacteremia. Request removal of tunneled HD catheter for line holiday. Currently patient has tunneled left IJ catheter exchanged 05/02/2022 in IR. EXAM: REMOVAL OF TUNNELED HEMODIALYSIS CATHETER MEDICATIONS: None COMPLICATIONS: None immediate. PROCEDURE: Informed written consent was obtained from the patient following an explanation of the procedure, risks, benefits and alternatives to treatment. A time out was performed prior to the initiation of the procedure. Maximal barrier sterile technique was utilized including  caps, mask, sterile gowns, sterile gloves, large sterile drape, hand hygiene, and ChloraPrep. Utilizing gentle traction, the catheter was removed intact. Hemostasis was obtained with manual compression. A dressing was placed. The patient tolerated the procedure well without immediate post procedural complication. IMPRESSION: Successful removal of LEFT chest tunneled dialysis catheter. Read by Candiss Norse, PA-C Electronically Signed   By: Michaelle Birks M.D.   On: 05/17/2022 17:42   DG Chest Port 1 View  Result Date: 05/16/2022 CLINICAL DATA:  Short of breath,  end-stage renal disease EXAM: PORTABLE CHEST 1 VIEW COMPARISON:  None Available. FINDINGS: LEFT-sided central venous line tip in the distal SVC. Stable cardiac silhouette. Mild central venous congestion pattern. Low lung volumes. IMPRESSION: Central venous congestion.  No overt pulmonary edema. Electronically Signed   By: Suzy Bouchard M.D.   On: 05/16/2022 14:58     LOS: 2 days   Signature  Lala Lund M.D on 05/18/2022 at 9:22 AM   -  To page go to [email or web address]

## 2022-05-18 NOTE — Progress Notes (Signed)
[facility] Progress Note   Subjective:   Continues to have severe pain and some concerns about costumer service (reports it took too long to bring her water, remove her tray, etc), but overall in better spirits today. Denies any SOB, CP, palpitations, dizziness, abdominal pain or nausea.   Objective Vitals:   05/17/22 1937 05/18/22 0002 05/18/22 0320 05/18/22 0803  BP: (!) 109/48 (!) 99/50 (!) 112/59 109/71  Pulse: (!) 106 (!) 106 96 96  Resp: '19 18 17 17  '$ Temp: 98.6 F (37 C) 98.5 F (36.9 C) 98.4 F (36.9 C) 98.7 F (37.1 C)  TempSrc: Oral Oral Oral Oral  SpO2: 91% (!) 89% 92% 91%  Weight:      Height:       Physical Exam General: Alert, seated on bedside, in NAD Heart: RRR, no murmurs, rubs or gallops Lungs: CTA bilaterally without wheezing, rhonchi or rales Abdomen: Soft, non-tender, non-distended, +BS Extremities: No edema b/l lower extremities Dialysis Access:  Bandage over prior [facility] site  Additional Objective Labs: Basic Metabolic Panel: Recent Labs  Lab 05/16/22 1407 05/17/22 0047 05/18/22 0433  NA 134* 130* 135  K 5.1 5.4* 5.0  CL 101 100 102  CO2 20* 15* 20*  GLUCOSE 122* 111* 100*  BUN 41* 47* 24*  CREATININE 8.21* 9.04* 5.49*  CALCIUM 8.8* 8.6* 8.4*  PHOS  --   --  2.9   Liver Function Tests: Recent Labs  Lab 05/16/22 1407 05/17/22 0047 05/18/22 0433  AST 21 25  --   ALT 16 14  --   ALKPHOS 109 89  --   BILITOT 0.6 0.8  --   PROT 7.4 6.6  --   ALBUMIN 2.5* 2.0* 2.0*   No results for input(s): LIPASE, AMYLASE in the last 168 hours. CBC: Recent Labs  Lab 05/16/22 1407 05/17/22 0047 05/18/22 0433  WBC 25.1* 22.0* 16.9*  HGB 10.9* 10.8* 9.8*  HCT 33.4* 32.6* 31.3*  MCV 95.2 92.1 95.4  PLT 269 192 250   Blood Culture    Component Value Date/Time   SDES  05/16/2022 1740    BLOOD SITE NOT SPECIFIED Performed at [facility] [address] [place], [place] [address]    SDES  05/16/2022 1740     BLOOD Performed at [facility], [place] [address] [place], [place] [address]    [place]  05/16/2022 1740    BOTTLES DRAWN AEROBIC AND ANAEROBIC Blood Culture adequate volume Performed at [facility] [address] [place], [place] [address]    [place]  05/16/2022 1740    BOTTLES DRAWN AEROBIC AND ANAEROBIC Blood Culture results may not be optimal due to an excessive volume of blood received in culture bottles Performed at [facility], [place] [address], [place], [place] [address]    CULT (A) 05/16/2022 1740    STAPHYLOCOCCUS AUREUS SUSCEPTIBILITIES TO FOLLOW Performed at [facility], [place] [address] [place], [place] [address]    CULT STAPHYLOCOCCUS AUREUS (A) 05/16/2022 1740   REPTSTATUS PENDING 05/16/2022 1740   REPTSTATUS PENDING 05/16/2022 1740    Cardiac Enzymes: No results for input(s): CKTOTAL, CKMB, CKMBINDEX, TROPONINI in the last 168 hours. CBG: Recent Labs  Lab 05/16/22 1410  GLUCAP 110*   Iron Studies: No results for input(s): IRON, TIBC, TRANSFERRIN, FERRITIN in the last 72 hours. '@lablastinr3'$ @ Studies/Results: CT Head Wo Contrast  Result Date: 05/16/2022 CLINICAL DATA:  Weakness. EXAM: CT HEAD WITHOUT CONTRAST TECHNIQUE: Contiguous axial  images were obtained from the base of the skull through the vertex without intravenous contrast. RADIATION DOSE REDUCTION: This exam was performed according to the departmental dose-optimization program which includes automated exposure control, adjustment of the mA and/or kV according to patient size and/or use of iterative reconstruction technique. COMPARISON:  March 09, 2014. FINDINGS: Brain: No evidence of acute infarction, hemorrhage, hydrocephalus, extra-axial collection or mass lesion/mass effect. Vascular: No hyperdense vessel or unexpected calcification. Skull: Normal. Negative for fracture or focal lesion. Sinuses/Orbits: No acute finding. Other: None.  IMPRESSION: No acute intracranial abnormality seen. Electronically Signed   By: Marijo Conception M.D.   On: 05/16/2022 16:01   MR HIP LEFT WO CONTRAST  Result Date: 05/16/2022 CLINICAL DATA:  Left hip pain.  Leukocytosis. EXAM: MR OF THE LEFT HIP WITHOUT CONTRAST TECHNIQUE: Multiplanar, multisequence MR imaging was performed. No intravenous contrast was administered. COMPARISON:  Radiographs 04/29/2022 and CT scan 07/16/2021 FINDINGS: Bones: Nonspecific abnormal edema signal in the left sacral ala, and tracking along the posterior margin of the left iliacus muscle. There is presacral edema in this region, tracking around the left sacral plexus for example on image 12 series 6. Further distally the sciatic nerve appears normal. No effusion of the SI joint; no marrow edema along the iliac side of the left SI joint. Today's exam was focused on the left hip and not on the sacrum. There is no hip effusion or marrow abnormality in the left proximal femur. Mild degenerative spurring of both femoral heads. Articular cartilage and labrum Articular cartilage:  Unremarkable Labrum:  Grossly unremarkable. Joint or bursal effusion Joint effusion:  Absent Bursae: No regional bursitis. Muscles and tendons Muscles and tendons: Small amount of abnormal edema signal anteriorly in the left gluteus maximus muscle near the left greater trochanter as shown on image 23 series 6. As noted above there is some low-grade edema tracking along the deep margin of the left iliacus muscle. Other findings Miscellaneous:   No supplemental non-categorized findings. IMPRESSION: 1. Abnormal edema in the left sacral ala is infiltrative but nonspecific. A stress fracture could potentially give this appearance and the adjacent edema tracking along the left iliacus muscle and left sacral plexus. Left sacral osteomyelitis is not totally excluded but there no effusion of the SI joint or edema along the iliac side of the SI joint to further suggest septic  SI joint. 2. Low-level edema anteriorly in the gluteus maximus muscle near the greater trochanter, nonspecific but possibly reflecting a muscle strain. Electronically Signed   By: Van Clines M.D.   On: 05/16/2022 21:55   IR Removal Tun Cv Cath W/O FL  Result Date: 05/17/2022 INDICATION: Patient history of end-stage renal disease on hemodialysis now admitted with bacteremia. Request removal of tunneled HD catheter for line holiday. Currently patient has tunneled left IJ catheter exchanged 05/02/2022 in IR. EXAM: REMOVAL OF TUNNELED HEMODIALYSIS CATHETER MEDICATIONS: None COMPLICATIONS: None immediate. PROCEDURE: Informed written consent was obtained from the patient following an explanation of the procedure, risks, benefits and alternatives to treatment. A time out was performed prior to the initiation of the procedure. Maximal barrier sterile technique was utilized including caps, mask, sterile gowns, sterile gloves, large sterile drape, hand hygiene, and ChloraPrep. Utilizing gentle traction, the catheter was removed intact. Hemostasis was obtained with manual compression. A dressing was placed. The patient tolerated the procedure well without immediate post procedural complication. IMPRESSION: Successful removal of LEFT chest tunneled dialysis catheter. Read by Candiss Norse, PA-C Electronically Signed   By:  Michaelle Birks M.D.   On: 05/17/2022 17:42   DG Chest Port 1 View  Result Date: 05/16/2022 CLINICAL DATA:  Short of breath,  end-stage renal disease EXAM: PORTABLE CHEST 1 VIEW COMPARISON:  None Available. FINDINGS: LEFT-sided central venous line tip in the distal SVC. Stable cardiac silhouette. Mild central venous congestion pattern. Low lung volumes. IMPRESSION: Central venous congestion.  No overt pulmonary edema. Electronically Signed   By: Suzy Bouchard M.D.   On: 05/16/2022 14:58   Medications:   ceFAZolin (ANCEF) IV 1 g (05/17/22 1712)    apixaban  2.5 mg Oral BID   aspirin  81  mg Oral Daily   atorvastatin  10 mg Oral QPM   Chlorhexidine Gluconate Cloth  6 each Topical Q0600   ferric citrate  840 mg Oral TID WC   gabapentin  300 mg Oral Daily   midodrine  5 mg Oral Q M,W,F-HD   pantoprazole  40 mg Oral Daily    Dialysis Orders: [facility] M[facility]  3.5h  92kg    400/1.5  2/2.5 bath  [facility] TDC  Hep 6000 - last HD 5/17 93.8 > 91.6 kg - last Hb 10.6 on 5/17 - hectorol 5 mcg IV tiw    Assessment/Plan: 1MSSA bacteremia - w/ fall, gen'd weakness, chills. Has TDC in chest, suspected HD cath infection.  IR removed TDC for line holiday after HD yesterday. Getting IV cefazolin.  ESRD - on HD MWF.  Discussed following a low potassium diet and fluid restrictions carefully during line holiday. Next HD likely Monday after new catheter is placed BP/vol - Hypotensive on arrival, BP now improved. Appears euvolemic on exam and is below her prior EDW L hip pain - Concern for septic hip, management per primary team/ortho Anemia esrd - Hb 9.8, follow trend and will start ESA with next HD if needed MBD ckd - Calcium and phosphorus controlled. Continue binders and hectorol.  Hyperkalemia - Resolved post HD. K+ tends to run high outpatient, discussed importance of strict low K diet during line holiday    Anice Paganini, PA-C 05/18/2022, 8:53 AM  [facility] Pager: [phone]

## 2022-05-18 NOTE — Consult Note (Signed)
Orthopaedic Consult  Date/Time: 05/18/22 10:41 AM  Patient Name: Whitney Walker  Attending Physician: Thurnell Lose, MD    ASSESSMENT & PLAN  Orthopaedic Assessment: 66 y.o. female with left gluteal strain.  Possible left sacral ala stress reaction on MRI, though on exam symptoms are more localizing to the gluteal musculature.  Minimal concern for infectious process or any pathology involving left hip at this time.  Reductions/Procedures/Splinting/Anesthesia Performed: Reductions: None Splinting/casting: None Procedure(s): None Anesthesia: N/A  Plan: Out of bed and mobilization with physical therapy/Occupational Therapy.  Pain control per primary team as medically appropriate.  Clinically the patient is able to fully range her left hip and has no pain or symptoms localizing to the hip joint (deep in the groin).  Pain and symptoms localize more to the gluteal musculature, and more proximally/posteriorly.  [facility] radiology imaging guided aspiration with cell count is required for a definitive diagnosis or rule out of septic arthritis; however, IR felt there was insufficient fluid in the joint to obtain a sample.  Furthermore, septic arthritis is exceedingly unlikely in the absence of clinical findings localizing to the hip with normal hip range of motion, and with no effusion on MRI.  No formal orthopedic follow-up is required for this problem.  Please request new orthopedic consultation if new/additional issues arise.   Whitney Walker M.D. Orthopaedic Surgery [facility]   Medical Decision Making  Amount/complexity of data: Is there a pathologic fracture (e.g. neoplastic, osteoporotic insufficiency fracture)? No Independent interpretation of radiographic studies: Yes Review of radiology results (e.g. reports): Yes Tests ordered (e.g. additional radiographic studies, labs): No Lab results reviewed: Yes Reviewed old records: Yes History from another  source (independent historian, e.g. family/friend/etc.): Yes Risk: Patient receiving IV controlled substances for pain: Yes Fracture requiring manipulation: No Urgent or emergent (non-elective) surgery likely this admission: No Presence of medical comorbidities and/or surgical risk factors (e.g. current smoker, CAD, diabetes, COPD, CKD, etc.): Yes Closed fracture management WITHOUT manipulation: No Urgent minor procedure (e.g. joint aspiration, compartment pressure measurement, etc.): No Will likely need surgery as an outpatient: No     HPI Whitney Walker is a 66 y.o. female. Orthopaedic consultation has specifically been requested to address this patient's current musculoskeletal presentation.  Specifically, orthopedic consultation was requested to evaluate for left hip septic arthritis in the setting of recent MSSA bacteremia.  Patient endorses chronic atraumatic left hip pain.  She denies any known injury.  Her pain and symptoms localize mostly to the gluteal musculature.  She does not have any pain or symptoms deep in the anterior groin, in the region of the actual hip joint.   PMH Past Medical History:  Diagnosis Date   Anemia    Anxiety    CHF (congestive heart failure) ([place])    Colon cancer ([place])    treatment surgery   Complication of anesthesia    after first C- [place] "couldnt walk after", patient denies having a spinal   COPD (chronic obstructive pulmonary disease) ([place])    Coronary artery disease    Depression    DVT (deep venous thrombosis) ([place])    ESRD on hemodialysis ([place])    Hemo: MWF   History of blood transfusion 04/2018   Hypertension    07/07/18- no longer takes BP medications   Meningitis    Pain in limb 07/30/2013   PE (pulmonary embolism)    Peripheral vascular disease ([facility])    Restless legs    Shortness of breath  with exertion   Sleep apnea    SOB (shortness of breath) 03/03/2019   Vertigo      PSH Past Surgical History:  Procedure  Laterality Date   ABDOMINAL AORTAGRAM N/A 07/26/2013   Procedure: ABDOMINAL AORTAGRAM;  Surgeon: [name] Glenarden, MD;  Location: [facility] CATH LAB;  Service: Cardiovascular;  Laterality: N/A;   ABDOMINAL HYSTERECTOMY     AV FISTULA PLACEMENT Left 05/05/2018   Procedure: ARTERIOVENOUS (AV) FISTULA CREATION BRACHIOCEPHALIC;  Surgeon: Waynetta Sandy, MD;  Location: [place];  Service: Vascular;  Laterality: Left;   AV FISTULA PLACEMENT Left 07/16/2018   Procedure: ARTERIOVENOUS FISTULA CREATION;  Surgeon: Waynetta Sandy, MD;  Location: [place];  Service: Vascular;  Laterality: Left;   [place] Left 09/03/2018   Procedure: BASILIC VEIN TRANSPOSITION SECOND STAGE;  Surgeon: Waynetta Sandy, MD;  Location: [place];  Service: Vascular;  Laterality: Left;   BREAST BIOPSY Left    CESAREAN SECTION     X 3 1974-1977   CHOLECYSTECTOMY     CHOLECYSTECTOMY  1980/s   COLECTOMY  2010   DIVERTICULOSIS SURGERY-2002  2012   FEMORAL-POPLITEAL BYPASS GRAFT Left 08/13/2013   Procedure: BYPASS GRAFT FEMORAL-POPLITEAL ARTERY WITH NON-REVERSED SAPHANEOUS VEIN; ULTRASOUND GUIDED;  Surgeon: Mal [name], MD;  Location: [place];  Service: Vascular;  Laterality: Left;   INSERTION OF DIALYSIS CATHETER Right 04/30/2018   Procedure: INSERTION OF Right Internal Jugular DIALYSIS CATHETER;  Surgeon: [name], MD;  Location: [facility] OR;  Service: Vascular;  Laterality: Right;   IR FLUORO GUIDE CV LINE LEFT  05/01/2022   IR FLUORO GUIDE CV LINE RIGHT  07/24/2020   IR FLUORO GUIDE CV LINE RIGHT  07/21/2021   IR REMOVAL TUN CV CATH W/O FL  07/21/2020   IR REMOVAL TUN CV CATH W/O FL  07/19/2021   IR REMOVAL TUN CV CATH W/O FL  05/17/2022   IR US GUIDE VASC ACCESS RIGHT  07/24/2020   IR US GUIDE VASC ACCESS RIGHT  07/21/2021   LEFT HEART CATH AND CORONARY ANGIOGRAPHY N/A 05/01/2021   Procedure: LEFT HEART CATH AND CORONARY ANGIOGRAPHY;  Surgeon: [name], MD;  Location: [place] CV LAB;  Service:  Cardiovascular;  Laterality: N/A;   LEFT HEART CATH AND CORONARY ANGIOGRAPHY N/A 05/02/2022   Procedure: LEFT HEART CATH AND CORONARY ANGIOGRAPHY;  Surgeon: [name], MD;  Location: [place] CV LAB;  Service: Cardiovascular;  Laterality: N/A;   LOWER EXTREMITY ANGIOGRAM Left 07/26/2013   Procedure: LOWER EXTREMITY ANGIOGRAM;  Surgeon: [name] Montour, MD;  Location: [facility] CATH LAB;  Service: Cardiovascular;  Laterality: Left;   Home Medications Prior to Admission medications   Medication Sig Start Date End Date Taking? Authorizing Provider  acetaminophen (TYLENOL) 650 MG CR tablet Take 1,950-2,600 mg by mouth 2 (two) times daily as needed for pain. Do not exceed more than '4000mg'$  (or 6 capsules) in 24hrs.   Yes [provider]  albuterol (VENTOLIN HFA) 108 (90 Base) MCG/ACT inhaler Inhale 2 puffs into the lungs every 6 (six) hours as needed for wheezing or shortness of breath. 08/03/20  Yes [name], FNP  Ascorbic Acid (VITAMIN C) 100 MG tablet Take 100 mg by mouth daily.   Yes [provider]  aspirin 81 MG chewable tablet Chew 1 tablet (81 mg total) by mouth daily. 05/03/22  Yes Ghimire, Henreitta Leber, MD  atorvastatin (LIPITOR) 10 MG tablet TAKE ONE TABLET BY MOUTH ONCE DAILY Patient taking differently: Take 10 mg  by mouth every evening. 05/13/22  Yes Cantwell, Celeste C, PA-C  AURYXIA 1 GM 210 MG(Fe) tablet Take 420-840 mg by mouth See admin instructions. Take '840mg'$  by mouth three times daily with meals and '420mg'$  with snacks. 05/15/21  Yes [provider]  Biotin 10000 MCG TABS Take 10,000 mcg by mouth daily.    Yes [provider]  ELIQUIS 2.5 MG TABS tablet TAKE ONE TABLET BY MOUTH TWICE DAILY Patient taking differently: Take 2.5 mg by mouth 2 (two) times daily. 04/15/22  Yes [name], FNP  gabapentin (NEURONTIN) 300 MG capsule Take 300 mg by mouth daily. 05/06/22  Yes [provider]  meclizine (ANTIVERT) 12.5 MG tablet Take 1 tablet (12.5 mg total) by  mouth 3 (three) times daily as needed for dizziness. 04/12/21  Yes Glendale Chard, MD  metoprolol succinate (TOPROL-XL) 25 MG 24 hr tablet Take 1 tablet (25 mg total) by mouth daily. 05/03/22  Yes Ghimire, Henreitta Leber, MD  midodrine (PROAMATINE) 10 MG tablet Take 5 mg by mouth every Monday, Wednesday, and Friday with hemodialysis. 04/17/22  Yes [provider]  multivitamin (RENA-VIT) TABS tablet Take 1 tablet by mouth every evening. 04/01/22  Yes [provider]  omeprazole (PRILOSEC) 20 MG capsule TAKE ONE CAPSULE BY MOUTH ONCE DAILY Patient taking differently: Take 20 mg by mouth daily. 04/26/22  Yes Truitt Merle, MD  oxyCODONE-acetaminophen (PERCOCET) 5-325 MG tablet Take 1 tablet by mouth every 4 (four) hours as needed for severe pain. 08/09/21  Yes [name], FNP  vitamin E 200 UNIT capsule Take 200 Units by mouth daily.   Yes [provider]  cyclobenzaprine (FLEXERIL) 10 MG tablet Take 1 tablet (10 mg total) by mouth 3 (three) times daily as needed for muscle spasms. Patient not taking: Reported on 05/16/2022 11/15/21   [name], FNP     Allergies Allergies  Allergen Reactions   Carnosine     Other reaction(s): Unknown   Gadolinium Derivatives Hives and Other (See Comments)    HIVES, Desc: HIVES W/ "DYE" USED FOR 1ST CT SCAN BUT NOT 2ND, NO PREMEDS USED, PT UNCERTAIN OF CIRCUMSTANCES,,?POSSIBLE MRI CONTRAST ALLERGY, ALL STUDIES DONE "SOMEWHERE" IN [place]//A.C., Onset Date: [id number]   Iohexol Hives and Other (See Comments)    Desc: HIVES W/ "DYE" USED FOR 1ST CT SCAN BUT NOT 2ND, NO PREMEDS USED, PT UNCERTAIN OF CIRCUMSTANCES,,?POSSIBLE MRI CONTRAST ALLERGY, ALL STUDIES DONE "SOMEWHERE" IN [place]//A.C., Onset Date: [id number]    Iodinated Contrast Media Hives   Iodine Hives   Naltrexone Other (See Comments)    Unknown     Family History Family History  Problem Relation Age of Onset   Cancer Mother 60       breast and bone   Cancer Father 70        prostate   Hypertension Sister    Bleeding Disorder Sister    Cancer Cousin 20       breast cancer    Hypertension Daughter    Breast cancer Neg Hx     Social History Social History   Socioeconomic History   Marital status: Single    Spouse name: Not on file   Number of children: 4   Years of education: 12th gd   Highest education level: Not on file  Occupational History   Occupation: N/A  Tobacco Use   Smoking status: Some Days    Packs/day: 0.50    Years: 50.00    Pack years: 25.00    Types:  Cigarettes    Last attempt to quit: 02/03/2021    Years since quitting: 1.2   Smokeless tobacco: Former   Tobacco comments:    She is using a Mining engineer Use: Every day   Devices: Vuse  Substance and Sexual Activity   Alcohol use: No    Alcohol/week: 0.0 standard drinks    Comment: she used to drink alcohol, quit in 2010; h/o heavy use    Drug use: Not Currently    Types: Oxycodone   Sexual activity: Not Currently  Other Topics Concern   Not on file  Social History Narrative   2 cups of coffee a day    Social Determinants of Health   Financial Resource Strain: Low Risk    Difficulty of Paying Living Expenses: Not hard at all  Food Insecurity: No Food Insecurity   Worried About Charity fundraiser in the Last Year: Never true   [place] in the Last Year: Never true  Transportation Needs: No Transportation Needs   Lack of Transportation (Medical): No   Lack of Transportation (Non-Medical): No  Physical Activity: Inactive   Days of Exercise per Week: 0 days   Minutes of Exercise per Session: 0 min  Stress: Stress Concern Present   Feeling of Stress : To some extent  Social Connections: Not on file  Intimate Partner Violence: Not on file     Review of Systems MSK: As noted per HPI above GI: No current Nausea/vomiting ENT: Denies sore throat, epistaxis CV: Denies chest pain  Resp: No current shortness of breath  Other than mentioned above,  there are no Constitutional, Neurological, Psychiatric, ENT, Ophthalmological, Cardiovascular, Respiratory, GI, GU, Musculoskeletal, Integumentary, Lymphatic, Endocrine or Allergic issues.     Imaging  Independent interpretation of orthopaedic-relevant films: Multiple radiographic views of the left hip and pelvis dated 04/29/2022 demonstrate mild degenerative changes and no acute osseous abnormality. MRI of the left hip without contrast dated 05/16/2022 demonstrates no hip effusion, and no marrow abnormality or findings consistent with osteomyelitis involving the proximal femur.  Some MRI findings consistent with stress fracture/reaction of left sacral ala and possible gluteus maximus strain.  Radiographic results: DG Chest 2 View  Result Date: 05/01/2022 CLINICAL DATA:  Chest pain, nonradiating chest pain in a 66 year old female. EXAM: CHEST - 2 VIEW COMPARISON:  July 17, 2021. FINDINGS: LEFT-sided dialysis catheter terminates in the RIGHT atrium after air via LEFT IJ approach. Trachea is midline. Cardiomediastinal contours and hilar structures with mild cardiac enlargement and central pulmonary vascular engorgement similar to the prior study. No frank edema. No consolidation. No sign of pleural effusion. On limited assessment there is no acute skeletal process. IMPRESSION: Mild cardiac enlargement and central pulmonary vascular engorgement. Electronically Signed   By: Zetta Bills M.D.   On: 05/01/2022 08:31   CT Head Wo Contrast  Result Date: 05/16/2022 CLINICAL DATA:  Weakness. EXAM: CT HEAD WITHOUT CONTRAST TECHNIQUE: Contiguous axial images were obtained from the base of the skull through the vertex without intravenous contrast. RADIATION DOSE REDUCTION: This exam was performed according to the departmental dose-optimization program which includes automated exposure control, adjustment of the mA and/or kV according to patient size and/or use of iterative reconstruction technique. COMPARISON:   March 09, 2014. FINDINGS: Brain: No evidence of acute infarction, hemorrhage, hydrocephalus, extra-axial collection or mass lesion/mass effect. Vascular: No hyperdense vessel or unexpected calcification. Skull: Normal. Negative for fracture or focal lesion. Sinuses/Orbits: No acute finding. Other:  None. IMPRESSION: No acute intracranial abnormality seen. Electronically Signed   By: Marijo Conception M.D.   On: 05/16/2022 16:01   CARDIAC CATHETERIZATION  Addendum Date: 05/02/2022   Left Heart Catheterization 05/02/22: RCA: Dominant.  Mild diffuse disease. Left main: Large vessel, very short and immediately bifurcates.  Calcified. LCx: Large vessel, [place] to a very high large OM1, a moderate-sized OM 2 distally, mild to moderate diffuse disease is present.  No high-grade stenosis. LAD: Large vessel, gives coronary to a very large D1, mild diffuse disease again evident without any focal high-grade stenosis.  Diffuse coronary calcification is again evident.  LAD is tortuous in the mid to distal segment suggestive of hypertensive heart disease. Recommendation: [name] positive cardiac markers probably related to very small vessel disease.  Do not see any obvious dissection in the major coronary vessels or thrombus formation or occlusion.  No significant change in coronary anatomy since prior angiography a year ago in 2022.  Recommend continuing medical therapy.  30 mL contrast utilized.  Result Date: 05/02/2022 Left Heart Catheterization 05/02/22: RCA: Dominant.  Mild diffuse disease. Left main: Large vessel, very short and immediately bifurcates.  Calcified. LCx: Large vessel, [place] to a very high large OM1, a moderate-sized OM 2 distally, mild to moderate diffuse disease is present.  No high-grade stenosis. LAD: Large vessel, gives coronary to a very large D1, mild diffuse disease again evident without any focal high-grade stenosis.  Diffuse coronary calcification is again evident.  LAD is tortuous in the mid to  distal segment suggestive of hypertensive heart disease. Recommendation: [name] positive cardiac markers probably related to very small vessel disease.  Do not see any obvious dissection in the major coronary vessels or thrombus formation or occlusion.  No significant change in coronary anatomy since prior angiography a year ago in 2022.  Recommend continuing medical therapy.  30 mL contrast utilized.   MR HIP LEFT WO CONTRAST  Result Date: 05/16/2022 CLINICAL DATA:  Left hip pain.  Leukocytosis. EXAM: MR OF THE LEFT HIP WITHOUT CONTRAST TECHNIQUE: Multiplanar, multisequence MR imaging was performed. No intravenous contrast was administered. COMPARISON:  Radiographs 04/29/2022 and CT scan 07/16/2021 FINDINGS: Bones: Nonspecific abnormal edema signal in the left sacral ala, and tracking along the posterior margin of the left iliacus muscle. There is presacral edema in this region, tracking around the left sacral plexus for example on image 12 series 6. Further distally the sciatic nerve appears normal. No effusion of the SI joint; no marrow edema along the iliac side of the left SI joint. Today's exam was focused on the left hip and not on the sacrum. There is no hip effusion or marrow abnormality in the left proximal femur. Mild degenerative spurring of both femoral heads. Articular cartilage and labrum Articular cartilage:  Unremarkable Labrum:  Grossly unremarkable. Joint or bursal effusion Joint effusion:  Absent Bursae: No regional bursitis. Muscles and tendons Muscles and tendons: Small amount of abnormal edema signal anteriorly in the left gluteus maximus muscle near the left greater trochanter as shown on image 23 series 6. As noted above there is some low-grade edema tracking along the deep margin of the left iliacus muscle. Other findings Miscellaneous:   No supplemental non-categorized findings. IMPRESSION: 1. Abnormal edema in the left sacral ala is infiltrative but nonspecific. A stress fracture  could potentially give this appearance and the adjacent edema tracking along the left iliacus muscle and left sacral plexus. Left sacral osteomyelitis is not totally excluded but there no effusion of  the SI joint or edema along the iliac side of the SI joint to further suggest septic SI joint. 2. Low-level edema anteriorly in the gluteus maximus muscle near the greater trochanter, nonspecific but possibly reflecting a muscle strain. Electronically Signed   By: Van Clines M.D.   On: 05/16/2022 21:55   IR Fluoro Guide CV Line Left  Result Date: 05/02/2022 INDICATION: 66 year old female with end-stage renal disease on hemodialysis has a malfunctioning left IJ tunneled dialysis catheter EXAM: XA - EXTERNAL REPAIR CV CATH MEDICATIONS: None; The antibiotic was administered within an appropriate time interval prior to skin puncture. ANESTHESIA/SEDATION: Procedure was performed under local anesthesia FLUOROSCOPY: Radiation Exposure Index (as provided by the fluoroscopic device): 57.8 mGy Kerma COMPLICATIONS: None immediate. PROCEDURE: Informed written consent was obtained from the patient after a thorough discussion of the procedural risks, benefits and alternatives. All questions were addressed. Maximal Sterile Barrier Technique was utilized including caps, mask, sterile gowns, sterile gloves, sterile drape, hand hygiene and skin antiseptic. A timeout was performed prior to the initiation of the procedure. Existing left IJ tunneled catheter site was prepped and draped in sterile fashion. 1% xylocaine was administered for local anesthesia. Then, with blunt and sharp dissection, the cuff of the catheter was released from the surrounding tissue, 035 inch stiff Glidewire was inserted, the catheter was removed and a new 14.5 [place] by 23 cm palindrome catheter was advanced over the Glidewire, to place the tip of the catheter at the caval atrial junction. Both the ports of the catheter aspirated and flushed briskly.  The ports were flushed with saline and heparin, the catheter was anchored to the skin using 0 Prolene and a sterile dressing including a Biopatch was placed. Fluoroscopic spot images obtained. IMPRESSION: Fluoroscopic guided left transjugular tunneled dialysis catheter exchange. Electronically Signed   By: Frazier Richards M.D.   On: 05/02/2022 08:48   IR Removal Tun Cv Cath W/O FL  Result Date: 05/17/2022 INDICATION: Patient history of end-stage renal disease on hemodialysis now admitted with bacteremia. Request removal of tunneled HD catheter for line holiday. Currently patient has tunneled left IJ catheter exchanged 05/02/2022 in IR. EXAM: REMOVAL OF TUNNELED HEMODIALYSIS CATHETER MEDICATIONS: None COMPLICATIONS: None immediate. PROCEDURE: Informed written consent was obtained from the patient following an explanation of the procedure, risks, benefits and alternatives to treatment. A time out was performed prior to the initiation of the procedure. Maximal barrier sterile technique was utilized including caps, mask, sterile gowns, sterile gloves, large sterile drape, hand hygiene, and ChloraPrep. Utilizing gentle traction, the catheter was removed intact. Hemostasis was obtained with manual compression. A dressing was placed. The patient tolerated the procedure well without immediate post procedural complication. IMPRESSION: Successful removal of LEFT chest tunneled dialysis catheter. Read by Candiss Norse, PA-C Electronically Signed   By: Michaelle Birks M.D.   On: 05/17/2022 17:42   DG Chest Port 1 View  Result Date: 05/16/2022 CLINICAL DATA:  Short of breath,  end-stage renal disease EXAM: PORTABLE CHEST 1 VIEW COMPARISON:  None Available. FINDINGS: LEFT-sided central venous line tip in the distal SVC. Stable cardiac silhouette. Mild central venous congestion pattern. Low lung volumes. IMPRESSION: Central venous congestion.  No overt pulmonary edema. Electronically Signed   By: Suzy Bouchard M.D.   On:  05/16/2022 14:58   DG Chest Port 1 View  Result Date: 05/02/2022 CLINICAL DATA:  Chest pain. EXAM: PORTABLE CHEST 1 VIEW COMPARISON:  Chest x-ray 07/17/2021. FINDINGS: Left-sided central venous catheter tip projects over the distal SVC.  The heart is mildly enlarged, unchanged. There is minimal left basilar atelectasis. There is no pleural effusion or pneumothorax. No acute fractures are seen. The heart size and mediastinal contours are within normal limits. Both lungs are clear. The visualized skeletal structures are unremarkable. IMPRESSION: No active disease.  Stable cardiomegaly. Electronically Signed   By: Ronney Asters M.D.   On: 05/02/2022 00:06   DG Hip Unilat W or Wo Pelvis 2-3 Views Left  Result Date: 04/29/2022 CLINICAL DATA:  Left-sided pain EXAM: DG HIP (WITH OR WITHOUT PELVIS) 2-3V LEFT COMPARISON:  None. FINDINGS: SI joints are non widened. Pubic symphysis is intact. There are vascular calcifications. No fracture or malalignment. IMPRESSION: No acute osseous abnormality. Electronically Signed   By: Donavan Foil M.D.   On: 04/29/2022 18:45   Labs  Recent Labs    05/16/22 1407 05/17/22 0047 05/18/22 0433  WBC 25.1* 22.0* 16.9*  HGB 10.9* 10.8* 9.8*  HCT 33.4* 32.6* 31.3*  PLT 269 192 250   Recent Labs    05/16/22 1407 05/17/22 0047 05/18/22 0433  NA 134* 130* 135  K 5.1 5.4* 5.0  CL 101 100 102  CO2 20* 15* 20*  BUN 41* 47* 24*  CREATININE 8.21* 9.04* 5.49*  GLUCOSE 122* 111* 100*  CALCIUM 8.8* 8.6* 8.4*   Lab Results  Component Value Date   INR 1.2 05/16/2022   INR 1.3 (H) 07/16/2021   INR 1.2 05/28/2021        Physical Examination  Patient is a 66 y.o. year old female who is alert, well appearing, and in no distress, mood is calm.  Orientation: oriented to person, place, time, and general circumstances  Vital Signs: BP 109/71 (BP Location: Right Arm)   Pulse 96   Temp 98.7 F (37.1 C) (Oral)   Resp 17   Ht '5\' 2"'$  (1.575 m)   Wt 89.9 kg   SpO2 91%    BMI 36.25 kg/m    Gait: Supine on stretcher.  Heart: Normal rate Lungs: Non-labored breathing Abdomen: Soft, Non-tender   Right Upper Extremity: Inspection: Atraumatic Palpation: Nontender ROM: Full, painless Joint Stability: No instability Strength: Normal Skin: Intact Peripheral Vascular: Well perfused Reflexes: No pathologic Sensation: Intact to light touch distally Lymph Nodes: None Palpable Coordination: Intact, normal   Left Upper Extremity: Inspection: Atraumatic Palpation: Nontender ROM: Full, painless Joint Stability: No instability Strength: Normal Skin: Intact Peripheral Vascular: Well perfused Reflexes: No pathologic Sensation: Intact to light touch distally Lymph Nodes: None Palpable Coordination: Intact, normal    Right Lower Extremity: Inspection: Atraumatic Palpation: Nontender ROM: Full, painless Joint Stability: No instability Strength: Normal Skin: Intact Peripheral Vascular: Well perfused Reflexes: No pathologic Sensation: Intact to light touch distally Lymph Nodes: None Palpable Coordination: Intact, normal   Left Lower Extremity: Inspection: Overall atraumatic appearance Palpation: Tenderness over gluteal musculature; no tenderness to deep palpation over the anterior hip joint capsular area or greater trochanter, and mild tenderness posteriorly in the sacroiliac area ROM: Full hip range of motion without any pain in the groin, but some discomfort in the gluteal musculature Joint Stability: No instability Strength: 5/5 hip flexion and abduction strength; 4/5 abduction strength, with limitation due to pain in the gluteal musculature Skin: Intact Peripheral Vascular: Well-perfused Reflexes: No pathologic Sensation: Intact light touch in the distal superficial peroneal, deep peroneal, and tibial distributions Lymph Nodes: None Palpable Coordination: Intact, normal    Pelvis: Skin: Intact Palpation: Mild tenderness posteriorly in the  sacroiliac area Stability: No instability  The review of the patient's medications does not in any way constitute an endorsement, by this clinician,  of their use, dosage, indications, route, efficacy, interactions, or other clinical parameters.  This note was generated within the EPIC EMR using Dragon medical speech recognition software and may contain inherent errors or omissions not intended by the user. Grammatical and punctuation errors, random word insertions, deletions, pronoun errors and incomplete sentences are occasional consequences of this technology due to software limitations. Not all errors are caught or corrected.  Although every attempt is made to root out erroneus and incomplete transcription, the note may still not fully represent the intent or opinion of the author. If there are questions or concerns about the content of this note or information contained within the body of this dictation they should be addressed directly with the author for clarification.

## 2022-05-18 NOTE — Progress Notes (Signed)
Request to IR for possible left hip aspiration due to left hip pain and questionable osteomyelitis. Patient history and imaging reviewed by Dr. Earleen Newport who does not note any fluid which is amenable to percutaneous aspiration.   Recommend repeating imaging in a few days if patient fails to improve.   IR remains available if needed, please call with questions or concerns.  Candiss Norse, PA-C

## 2022-05-18 NOTE — Progress Notes (Signed)
Attempted to see and evaluate patient for L hip pain at 8:43 AM on 05/18/22.  Patient not in room.  Was called by hospitalist to evaluate L hip pain with concern for L septic hip in the setting of recent MSSA bacteremia.    Imaging reviewed.  Multiple radiographic views of the left hip and pelvis dated 04/29/2022 demonstrates no acute osseous abnormality.  MRI of the left hip without contrast dated 05/16/2022 demonstrates no hip effusion, and no marrow abnormality or findings consistent with osteomyelitis involving the proximal femur.  Some MRI findings consistent with stress fracture/reaction of left sacral ala and possible gluteus maximus strain.  Left hip septic arthritis is likely in the setting of no effusion and otherwise negative MRI findings; however, in order to definitively rule out left hip septic arthritis, and aspiration is required.  Therefore if the goal is to definitively rule out left septic hip I recommend [facility] radiology guided aspiration of the left hip, with fluid sent for (in order of priority) cell count, Gram stain/culture, and crystals.  A synovial cell count is the single most important factor in determining the presence of septic arthritis.  Will attempt to evaluate patient at a later time when she is in her room.  Full consult note to follow at that time.  Georgeanna Harrison M.D. Orthopaedic Surgery [facility]

## 2022-05-18 NOTE — Progress Notes (Signed)
  Echocardiogram 2D Echocardiogram has been performed.  Merrie Roof F 05/18/2022, 11:45 AM

## 2022-05-19 DIAGNOSIS — W19XXXA Unspecified fall, initial encounter: Secondary | ICD-10-CM | POA: Diagnosis not present

## 2022-05-19 DIAGNOSIS — I5032 Chronic diastolic (congestive) heart failure: Secondary | ICD-10-CM | POA: Diagnosis not present

## 2022-05-19 LAB — CBC
HCT: 29.9 % — ABNORMAL LOW (ref 36.0–46.0)
Hemoglobin: 9.5 g/dL — ABNORMAL LOW (ref 12.0–15.0)
MCH: 30.1 pg (ref 26.0–34.0)
MCHC: 31.8 g/dL (ref 30.0–36.0)
MCV: 94.6 fL (ref 80.0–100.0)
Platelets: 276 10*3/uL (ref 150–400)
RBC: 3.16 MIL/uL — ABNORMAL LOW (ref 3.87–5.11)
RDW: 14.9 % (ref 11.5–15.5)
WBC: 14.7 10*3/uL — ABNORMAL HIGH (ref 4.0–10.5)
nRBC: 0 % (ref 0.0–0.2)

## 2022-05-19 LAB — ECHOCARDIOGRAM COMPLETE
Area-P 1/2: 3.46 cm2
Height: 62 in
MV VTI: 1.51 cm2
S' Lateral: 2.1 cm
Weight: 3171.1 oz

## 2022-05-19 LAB — RENAL FUNCTION PANEL
Albumin: 2.1 g/dL — ABNORMAL LOW (ref 3.5–5.0)
Anion gap: 13 (ref 5–15)
BUN: 36 mg/dL — ABNORMAL HIGH (ref 8–23)
CO2: 18 mmol/L — ABNORMAL LOW (ref 22–32)
Calcium: 8.5 mg/dL — ABNORMAL LOW (ref 8.9–10.3)
Chloride: 100 mmol/L (ref 98–111)
Creatinine, Ser: 9.05 mg/dL — ABNORMAL HIGH (ref 0.44–1.00)
GFR, Estimated: 4 mL/min — ABNORMAL LOW (ref >60)
Glucose, Bld: 145 mg/dL — ABNORMAL HIGH (ref 70–99)
Phosphorus: 3.8 mg/dL (ref 2.5–4.6)
Potassium: 4.2 mmol/L (ref 3.5–5.1)
Sodium: 131 mmol/L — ABNORMAL LOW (ref 135–145)

## 2022-05-19 LAB — CULTURE, BLOOD (ROUTINE X 2): Special Requests: ADEQUATE

## 2022-05-19 LAB — HEPATITIS B SURFACE ANTIGEN: Hepatitis B Surface Ag: NONREACTIVE

## 2022-05-19 LAB — HEPATITIS B SURFACE ANTIBODY,QUALITATIVE: Hep B S Ab: NONREACTIVE

## 2022-05-19 MED ORDER — KETOROLAC TROMETHAMINE 30 MG/ML IJ SOLN
30.0000 mg | Freq: Once | INTRAMUSCULAR | Status: DC
  Filled 2022-05-19: qty 1

## 2022-05-19 MED ORDER — CHLORHEXIDINE GLUCONATE CLOTH 2 % EX PADS
6.0000 | MEDICATED_PAD | Freq: Every day | CUTANEOUS | Status: DC
  Administered 2022-05-20 – 2022-05-22 (×3): 6 via TOPICAL

## 2022-05-19 MED ORDER — DICLOFENAC SODIUM 1 % EX GEL
2.0000 g | Freq: Four times a day (QID) | CUTANEOUS | Status: DC
  Administered 2022-05-19 – 2022-05-21 (×9): 2 g via TOPICAL
  Filled 2022-05-19: qty 100

## 2022-05-19 NOTE — Progress Notes (Signed)
PROGRESS NOTE        PATIENT DETAILS Name: Whitney Walker Age: 66 y.o. Sex: female Date of Birth: May 30, 1956 Admit Date: 05/16/2022 Admitting Physician Whitney Hazel, MD BWG:YKZLD, Whitney Burke, FNP  Brief Summary: Patient is a 66 y.o.  female with history of ESRD on HD MWF, VTE on Whitney Walker, chronic left hip pain-referred to the ED by oncologist for evaluation of tension/chills and a mechanical fall.  She was found to have significant leukocytosis-and subsequently admitted to the hospitalist service.   Post admission-blood cultures (prelim) with positive for MSSA.  Significant events: 5/3-5/5>> Hospitalized for malfunctioning HD catheter (exchanged by IR) and NSTEMI-LHC with small vessel disease 5/18>>Admit for chills/hypotension/mechanical fall-blood cultures +ve for MSSA  Significant studies: 5/18>> CXR: No PNA 5/18>> CT head: No acute intracranial abnormality 5/18>> MRI left hip: Abnormal edema in the left sacral alla-possible infiltrative-possible stress fracture. 5/21 >> Echocardiogram  Significant microbiology data: 5/18>> COVID/influenza PCR: Negative 5/18>> blood culture: Gram-positive cocci (MSSA on BCID)  Procedures: 5/3>> HD catheter exchanged by IR. 5/4>> left heart catheterization by Dr. Einar Walker  Consults: ID, nephrology, orthopedics Dr. Mable Walker  Subjective:   Patient in bed, appears comfortable, denies any headache, no fever, no chest pain or pressure, no shortness of breath , no abdominal pain. No new focal weakness. +ve L. Hip pain.   Objective: Vitals: Blood pressure 133/63, pulse 92, temperature 98.2 F (36.8 C), temperature source Oral, resp. rate 20, height '5\' 2"'$  (1.575 m), weight 89.9 kg, SpO2 98 %.   Exam:  Awake Alert, No new F.N deficits, Normal affect Kinder.AT,PERRAL Supple Neck, No JVD,   Symmetrical Chest wall movement, Good air movement bilaterally, CTAB RRR,No Gallops, Rubs or new Murmurs,  +ve B.Sounds, Abd Soft, No  tenderness,   No Cyanosis, Clubbing or edema     Assessment/Plan:  Sepsis due to MSSA bacteremia: BP improved-culture data as above-obtain echo. She does have a HD catheter-catheter site appears benign without any obvious discharge.  HD catheter was recently exchanged by IR on 5/3 during her prior hospitalization.  Catheter again removed on 05/17/2022 by IR with possible new catheter placement on 05/20/2022, ID and nephrology directing catheter care and placement, seen by orthopedics due to questionable CT of the left hip.  Echocardiogram pending.  Acute on chronic left-sided hip pain: Left hip MRI noted with possible left hip stress fracture, muscle strain and possible osteomyelitis/infection.  Seen by orthopedics Dr. Mable Walker and IR, not enough fluid to be tapped, her pain is on the left lateral side around the gluteal tuberosity area, could be bursitis or musculoskeletal, will give a dose of Toradol and Bengay cream and monitor with supportive care.  Does not seem to be hip joint related pain.  Hypotension: Resolved-suspect this was likely due to sepsis/MSSA bacteremia-in recent diarrheal illness during Mother's Day weekend (resolved).  ESRD on HD MWF: Nephrology following.  Normocytic anemia: Due to ESRD-defer Aranesp/IV iron to nephrology.  Hyperkalemia: Mild-for HD later today.  Repeat electrolytes tomorrow.  History of recurrent VTE: On Whitney Walker.  Recent non-STEMI: No chest pain-recent LHC negative for large vessel disease-suspected to have small vessel disease.  Cardiology recommended aspirin x30 days along with Whitney Walker.  Chronic HFpEF: Euvolemic-volume removal with HD  HTN: BP soft-continue to hold Coreg  HLD: On statin  History of PAD s/p left femoral popliteal bypass in 2014  History of colon  cancer: Follows with oncology/Dr. Collene Walker in the outpatient setting-in remission   OSA: Apparently not on CPAP at home.  Tobacco abuse: Counseled  Obesity: Estimated body mass index is  36.25 kg/m as calculated from the following:   Height as of this encounter: '5\' 2"'$  (1.575 m).   Weight as of this encounter: 89.9 kg.   Code status:   Code Status: Full Code   DVT Prophylaxis: apixaban (Whitney Walker) tablet 2.5 mg Start: 05/16/22 2200 apixaban (Whitney Walker) tablet 2.5 mg    Family Communication: Daughter-Whitney Walker-[phone]-left voicemail on 5/19   Disposition Plan: Status is: Inpatient Remains inpatient appropriate because: Sepsis due to MSSA bacteremia-noted stable for discharge.   Planned Discharge Destination:Home   Diet: Diet Order             Diet regular Room service appropriate? Yes; Fluid consistency: Thin  Diet effective now                     Antimicrobial agents: Anti-infectives (From admission, onward)    Start     Dose/Rate Route Frequency Ordered Stop   05/17/22 1115  ceFAZolin (ANCEF) IVPB 1 g/50 mL premix        1 g 100 mL/hr over 30 Minutes Intravenous Every 24 hours 05/17/22 1023     05/16/22 1715  vancomycin (VANCOREADY) IVPB 2000 mg/400 mL        2,000 mg 200 mL/hr over 120 Minutes Intravenous  Once 05/16/22 1701 05/16/22 2146   05/16/22 1715  ceFEPIme (MAXIPIME) 2 g in sodium chloride 0.9 % 100 mL IVPB        2 g 200 mL/hr over 30 Minutes Intravenous  Once 05/16/22 1701 05/16/22 1847        MEDICATIONS: Scheduled Meds:  apixaban  2.5 mg Oral BID   aspirin  81 mg Oral Daily   atorvastatin  10 mg Oral QPM   Chlorhexidine Gluconate Cloth  6 each Topical Q0600   diclofenac Sodium  2 g Topical QID   ferric citrate  840 mg Oral TID WC   gabapentin  300 mg Oral Daily   ketorolac  30 mg Intravenous Once   midodrine  5 mg Oral Q M,W,F-HD   pantoprazole  40 mg Oral Daily   Continuous Infusions:   ceFAZolin (ANCEF) IV 1 g (05/18/22 1744)   PRN Meds:.acetaminophen **OR** acetaminophen, albuterol, ferric citrate, HYDROmorphone (DILAUDID) injection, oxyCODONE   I have personally reviewed following labs and imaging  studies  LABORATORY DATA: Recent Labs  Lab 05/16/22 1407 05/17/22 0047 05/18/22 0433  WBC 25.1* 22.0* 16.9*  HGB 10.9* 10.8* 9.8*  HCT 33.4* 32.6* 31.3*  PLT 269 192 250  MCV 95.2 92.1 95.4  MCH 31.1 30.5 29.9  MCHC 32.6 33.1 31.3  RDW 15.9* 15.4 15.7*    Recent Labs  Lab 05/16/22 1407 05/16/22 1740 05/17/22 0047 05/18/22 0433  NA 134*  --  130* 135  K 5.1  --  5.4* 5.0  CL 101  --  100 102  CO2 20*  --  15* 20*  GLUCOSE 122*  --  111* 100*  BUN 41*  --  47* 24*  CREATININE 8.21*  --  9.04* 5.49*  CALCIUM 8.8*  --  8.6* 8.4*  AST 21  --  25  --   ALT 16  --  14  --   ALKPHOS 109  --  89  --   BILITOT 0.6  --  0.8  --   ALBUMIN 2.5*  --  2.0* 2.0*  PHOS  --   --   --  2.9  LATICACIDVEN  --  1.1 1.1  --   INR 1.2  --   --   --     RADIOLOGY STUDIES/RESULTS: IR Removal Tun Cv Cath W/O FL  Result Date: 05/17/2022 INDICATION: Patient history of end-stage renal disease on hemodialysis now admitted with bacteremia. Request removal of tunneled HD catheter for line holiday. Currently patient has tunneled left IJ catheter exchanged 05/02/2022 in IR. EXAM: REMOVAL OF TUNNELED HEMODIALYSIS CATHETER MEDICATIONS: None COMPLICATIONS: None immediate. PROCEDURE: Informed written consent was obtained from the patient following an explanation of the procedure, risks, benefits and alternatives to treatment. A time out was performed prior to the initiation of the procedure. Maximal barrier sterile technique was utilized including caps, mask, sterile gowns, sterile gloves, large sterile drape, hand hygiene, and ChloraPrep. Utilizing gentle traction, the catheter was removed intact. Hemostasis was obtained with manual compression. A dressing was placed. The patient tolerated the procedure well without immediate post procedural complication. IMPRESSION: Successful removal of LEFT chest tunneled dialysis catheter. Read by Candiss Norse, PA-C Electronically Signed   By: Michaelle Birks M.D.   On:  05/17/2022 17:42     LOS: 3 days   Signature  Lala Lund M.D on 05/19/2022 at 10:47 AM   -  To page go to [email or web address]

## 2022-05-19 NOTE — TOC Initial Note (Signed)
Transition of Care [facility]) - Initial/Assessment Note    Patient Details  Name: Whitney Walker MRN: [id number] Date of Birth: 11/14/1956  Transition of [facility]) CM/SW Contact:    Carles Collet, RN Phone Number: 05/19/2022, 3:50 PM  Clinical Narrative:                 Spoke to patient at bedside. She would like [place] services. She mentions that she would like [facility] again, and they are able to accept referral. She has rollator at home, and daughter who can support. Daughter can provide transportation at DC.    Expected Discharge Plan: [place] Barriers to Discharge: Continued Medical Work up   Patient Goals and CMS Choice Patient states their goals for this hospitalization and ongoing recovery are:: to go home CMS Medicare.gov Compare Post Acute Care list provided to:: Patient Choice offered to / list presented to : Patient  Expected Discharge Plan and Services Expected Discharge Plan: [place]   Discharge Planning Services: CM Consult Post Acute Care Choice: [place] arrangements for the past 2 months: Apartment                           HH Arranged: PT, OT HH Agency: [facility] Agency Contacted: 05/19/22 Time HH Agency Contacted: 1550 Representative spoke with at [place]: Tommi Rumps  Prior Living Arrangements/Services Living arrangements for the past 2 months: Apartment Lives with:: Self   Do you feel safe going back to the place where you live?: Yes               Activities of Daily Living Home Assistive Devices/Equipment: Environmental consultant (specify type), Eyeglasses (reading glasses) ADL Screening (condition at time of admission) [name] cognitive ability adequate to safely complete daily activities?: Yes Is the patient deaf or have difficulty hearing?: No Does the patient have difficulty seeing, even when wearing glasses/contacts?: No Does the patient have difficulty concentrating, remembering, or  making decisions?: No Patient able to express need for assistance with ADLs?: No Does the patient have difficulty dressing or bathing?: Yes Independently performs ADLs?: No Communication: Independent Dressing (OT): Needs assistance Is this a change from baseline?: Change from baseline, expected to last >3 days Grooming: Needs assistance Is this a change from baseline?: Change from baseline, expected to last >3 days Feeding: Needs assistance Is this a change from baseline?: Change from baseline, expected to last >3 days Bathing: Needs assistance Is this a change from baseline?: Change from baseline, expected to last >3 days Toileting: Needs assistance Is this a change from baseline?: Change from baseline, expected to last >3days In/Out Bed: Needs assistance Is this a change from baseline?: Change from baseline, expected to last >3 days Walks in Home: Needs assistance Is this a change from baseline?: Change from baseline, expected to last >3 days Does the patient have difficulty walking or climbing stairs?: Yes Weakness of Legs: Both Weakness of Arms/Hands: Both  Permission Sought/Granted                  Emotional Assessment              Admission diagnosis:  Fall [W19.XXXA] General weakness [R53.1] Severe comorbid illness [R69] Patient Active Problem List   Diagnosis Date Noted   Sepsis due to other specified Staphylococcus [facility])    Fall 05/16/2022   Left hip pain 05/16/2022   Tobacco abuse 05/16/2022  Hypotension 05/16/2022   Chest pain 05/02/2022   Elevated troponin    Non-ST elevation (NSTEMI) myocardial infarction [facility])    Chest wall pain 05/01/2022   History of pulmonary embolism 05/01/2022   Protrusion of lumbar intervertebral disc 10/09/2021   Facet arthritis, degenerative, lumbar spine 10/09/2021   Acute low back pain    MSSA bacteremia    Septic shock (HCC) 07/16/2021   Nausea and vomiting    Abdominal pain    FUO (fever of unknown origin)     Polyarthritis    Polyarthralgia    Hyperkalemia, diminished renal excretion 05/28/2021   Generalized weakness 05/28/2021   Rash and nonspecific skin eruption 05/28/2021   Atypical chest pain    Coronary artery calcification seen on CAT scan    Hyperkalemia 05/09/2020   Left leg pain 05/09/2020   Restless leg syndrome 09/19/2018   OSA (obstructive sleep apnea) 04/29/2018   ESRD (end stage renal disease) ([place]) 04/29/2018   Dyspnea on exertion 03/12/2017   Morbid (severe) obesity due to excess calories ([place]) 03/12/2017   Angina pectoris ([place]) 03/02/2017   Anemia of chronic kidney failure, stage 3 (moderate) ([place]) 12/10/2016   History of colon cancer 08/12/2016   Mass of left breast on mammogram 12/10/2015   Depression 10/18/2015   Numbness and tingling of left arm and leg 01/24/2015   PVD (peripheral vascular disease) with claudication ([place]) 03/23/2014   PAD (peripheral artery disease) ([place]) 12/14/2013   Chronic diastolic heart failure ([place]) 11/15/2013   Essential hypertension 11/15/2013   Leucocytosis 04/28/2012   PCP:  [name], FNP Pharmacy:   [facility] - [place], [place] - [address] [place] [address] Phone: [phone] Fax: [phone]     Social Determinants of Health (SDOH) Interventions    Readmission Risk Interventions    11/01/2020   12:53 PM  Readmission Risk Prevention Plan  Transportation Screening Complete  PCP or Specialist Appt within 3-5 Days Complete  HRI or Home Care Consult Complete  Social Work Consult for [place] Planning/Counseling Complete  Palliative Care Screening Not Applicable  Medication Review Press [employer]) Complete

## 2022-05-19 NOTE — Progress Notes (Signed)
[facility] Progress Note   Subjective:   C/o hip pain again this AM. WBC trending down. Pt denies SOB, CP, dizziness, abdominal pain and nausea  Objective Vitals:   05/18/22 1621 05/18/22 1933 05/18/22 2308 05/19/22 0311  BP: 110/66 114/60 136/70 133/63  Pulse: 86 93 98 93  Resp: '17 20 12 20  '$ Temp: 98.2 F (36.8 C) 98.2 F (36.8 C) 98.2 F (36.8 C) 98.2 F (36.8 C)  TempSrc: Oral Oral Oral Oral  SpO2: 98%     Weight:      Height:       Physical Exam General: Alert female, in NAD Heart: RRR, no murmurs, rubs or gallops Lungs: CTA bilaterally without wheezing, rhonchi or rales Abdomen: Soft, non-tender, non-distended, +BS Extremities: No edema b/l lower extremities Dialysis Access:  Bandage over prior [facility] site  Additional Objective Labs: Basic Metabolic Panel: Recent Labs  Lab 05/16/22 1407 05/17/22 0047 05/18/22 0433  NA 134* 130* 135  K 5.1 5.4* 5.0  CL 101 100 102  CO2 20* 15* 20*  GLUCOSE 122* 111* 100*  BUN 41* 47* 24*  CREATININE 8.21* 9.04* 5.49*  CALCIUM 8.8* 8.6* 8.4*  PHOS  --   --  2.9   Liver Function Tests: Recent Labs  Lab 05/16/22 1407 05/17/22 0047 05/18/22 0433  AST 21 25  --   ALT 16 14  --   ALKPHOS 109 89  --   BILITOT 0.6 0.8  --   PROT 7.4 6.6  --   ALBUMIN 2.5* 2.0* 2.0*   No results for input(s): LIPASE, AMYLASE in the last 168 hours. CBC: Recent Labs  Lab 05/16/22 1407 05/17/22 0047 05/18/22 0433  WBC 25.1* 22.0* 16.9*  HGB 10.9* 10.8* 9.8*  HCT 33.4* 32.6* 31.3*  MCV 95.2 92.1 95.4  PLT 269 192 250   Blood Culture    Component Value Date/Time   SDES  05/16/2022 1740    BLOOD SITE NOT SPECIFIED Performed at [facility] [address], [place], [place] [address]    SDES  05/16/2022 1740    BLOOD Performed at [facility], [place] [address], [place], [place] [address]    [place]  05/16/2022 1740    BOTTLES DRAWN AEROBIC AND ANAEROBIC Blood Culture adequate  volume Performed at [facility] [address], [place], [place] [address]    [place]  05/16/2022 1740    BOTTLES DRAWN AEROBIC AND ANAEROBIC Blood Culture results may not be optimal due to an excessive volume of blood received in culture bottles Performed at [facility], [place] [address] [place], [place] [address]    CULT (A) 05/16/2022 1740    STAPHYLOCOCCUS AUREUS SUSCEPTIBILITIES TO FOLLOW Performed at [facility], [place] [address], [place], [place] [address]    CULT STAPHYLOCOCCUS AUREUS (A) 05/16/2022 1740   REPTSTATUS PENDING 05/16/2022 1740   REPTSTATUS PENDING 05/16/2022 1740    Cardiac Enzymes: No results for input(s): CKTOTAL, CKMB, CKMBINDEX, TROPONINI in the last 168 hours. CBG: Recent Labs  Lab 05/16/22 1410  GLUCAP 110*   Iron Studies: No results for input(s): IRON, TIBC, TRANSFERRIN, FERRITIN in the last 72 hours. '@lablastinr3'$ @ Studies/Results: IR Removal Tun Cv Cath W/O FL  Result Date: 05/17/2022 INDICATION: Patient history of end-stage renal disease on hemodialysis now admitted with bacteremia. Request removal of tunneled HD catheter for line holiday. Currently patient has tunneled left IJ catheter exchanged 05/02/2022 in IR. EXAM: REMOVAL OF TUNNELED HEMODIALYSIS CATHETER MEDICATIONS:  None COMPLICATIONS: None immediate. PROCEDURE: Informed written consent was obtained from the patient following an explanation of the procedure, risks, benefits and alternatives to treatment. A time out was performed prior to the initiation of the procedure. Maximal barrier sterile technique was utilized including caps, mask, sterile gowns, sterile gloves, large sterile drape, hand hygiene, and ChloraPrep. Utilizing gentle traction, the catheter was removed intact. Hemostasis was obtained with manual compression. A dressing was placed. The patient tolerated the procedure well without immediate post procedural complication. IMPRESSION:  Successful removal of LEFT chest tunneled dialysis catheter. Read by Candiss Norse, PA-C Electronically Signed   By: Michaelle Birks M.D.   On: 05/17/2022 17:42   Medications:   ceFAZolin (ANCEF) IV 1 g (05/18/22 1744)    apixaban  2.5 mg Oral BID   aspirin  81 mg Oral Daily   atorvastatin  10 mg Oral QPM   Chlorhexidine Gluconate Cloth  6 each Topical Q0600   ferric citrate  840 mg Oral TID WC   gabapentin  300 mg Oral Daily   midodrine  5 mg Oral Q M,W,F-HD   pantoprazole  40 mg Oral Daily    Dialysis Orders: [place] MWF  3.5h  92kg    400/1.5  2/2.5 bath  [facility] TDC  Hep 6000 - last HD 5/17 93.8 > 91.6 kg - last Hb 10.6 on 5/17 - hectorol 5 mcg IV tiw  Assessment/Plan: MSSA bacteremia - w/ fall, gen'd weakness, chills. Has TDC in chest, suspected HD cath infection.  IR removed TDC for line holiday after HD Friday, plan for new [facility] tomorrow if repeat blood cultures remain negative. Getting IV cefazolin.  ESRD - on HD MWF.  Discussed following a low potassium diet and fluid restrictions carefully during line holiday. Next HD likely Monday after new catheter is placed BP/vol - Hypotensive on arrival, BP now improved. Appears euvolemic on exam and is below her prior EDW L hip pain - Concern for septic hip, management per primary team/ortho Anemia esrd - Hb 9.8, follow trend and will start ESA with next HD if needed MBD ckd - Calcium and phosphorus controlled. Continue binders and hectorol.  Hyperkalemia - Resolved post HD. K+ tends to run high outpatient, discussed importance of strict low K diet during line holiday    Anice Paganini, PA-C 05/19/2022, 8:06 AM  [facility] Pager: [phone]

## 2022-05-20 ENCOUNTER — Telehealth: Payer: Self-pay | Admitting: Hematology

## 2022-05-20 ENCOUNTER — Inpatient Hospital Stay (HOSPITAL_COMMUNITY): Payer: Medicare Other

## 2022-05-20 DIAGNOSIS — W19XXXA Unspecified fall, initial encounter: Secondary | ICD-10-CM | POA: Diagnosis not present

## 2022-05-20 DIAGNOSIS — R531 Weakness: Secondary | ICD-10-CM

## 2022-05-20 DIAGNOSIS — I5032 Chronic diastolic (congestive) heart failure: Secondary | ICD-10-CM | POA: Diagnosis not present

## 2022-05-20 HISTORY — PX: IR US GUIDE VASC ACCESS RIGHT: IMG2390

## 2022-05-20 HISTORY — PX: IR FLUORO GUIDE CV LINE RIGHT: IMG2283

## 2022-05-20 LAB — CBC WITH DIFFERENTIAL/PLATELET
Abs Immature Granulocytes: 0.15 10*3/uL — ABNORMAL HIGH (ref 0.00–0.07)
Basophils Absolute: 0 10*3/uL (ref 0.0–0.1)
Basophils Relative: 0 %
Eosinophils Absolute: 0.4 10*3/uL (ref 0.0–0.5)
Eosinophils Relative: 3 %
HCT: 29.9 % — ABNORMAL LOW (ref 36.0–46.0)
Hemoglobin: 9.1 g/dL — ABNORMAL LOW (ref 12.0–15.0)
Immature Granulocytes: 1 %
Lymphocytes Relative: 13 %
Lymphs Abs: 2 10*3/uL (ref 0.7–4.0)
MCH: 29.4 pg (ref 26.0–34.0)
MCHC: 30.4 g/dL (ref 30.0–36.0)
MCV: 96.8 fL (ref 80.0–100.0)
Monocytes Absolute: 1.3 10*3/uL — ABNORMAL HIGH (ref 0.1–1.0)
Monocytes Relative: 8 %
Neutro Abs: 11.4 10*3/uL — ABNORMAL HIGH (ref 1.7–7.7)
Neutrophils Relative %: 75 %
Platelets: 267 10*3/uL (ref 150–400)
RBC: 3.09 MIL/uL — ABNORMAL LOW (ref 3.87–5.11)
RDW: 15.2 % (ref 11.5–15.5)
WBC: 15.3 10*3/uL — ABNORMAL HIGH (ref 4.0–10.5)
nRBC: 0 % (ref 0.0–0.2)

## 2022-05-20 LAB — RENAL FUNCTION PANEL
Albumin: 2 g/dL — ABNORMAL LOW (ref 3.5–5.0)
Anion gap: 12 (ref 5–15)
BUN: 36 mg/dL — ABNORMAL HIGH (ref 8–23)
CO2: 19 mmol/L — ABNORMAL LOW (ref 22–32)
Calcium: 8.7 mg/dL — ABNORMAL LOW (ref 8.9–10.3)
Chloride: 103 mmol/L (ref 98–111)
Creatinine, Ser: 9.12 mg/dL — ABNORMAL HIGH (ref 0.44–1.00)
GFR, Estimated: 4 mL/min — ABNORMAL LOW (ref >60)
Glucose, Bld: 122 mg/dL — ABNORMAL HIGH (ref 70–99)
Phosphorus: 3.9 mg/dL (ref 2.5–4.6)
Potassium: 4.6 mmol/L (ref 3.5–5.1)
Sodium: 134 mmol/L — ABNORMAL LOW (ref 135–145)

## 2022-05-20 IMAGING — US IR FLUORO GUIDE CV LINE*R*
2 series · 5 of 5 positions shown · non-contrast
Comparison: none

INDICATION: 65-year-old woman with history of renal failure and multiple prior
dialysis catheter placement and removal, most recently on
[DATE], presents to IR for tunneled IJ catheter placement.

[Series 1: ir fluoro guide cv line*right* · 4 of 4 slices shown]
[im 1/4]
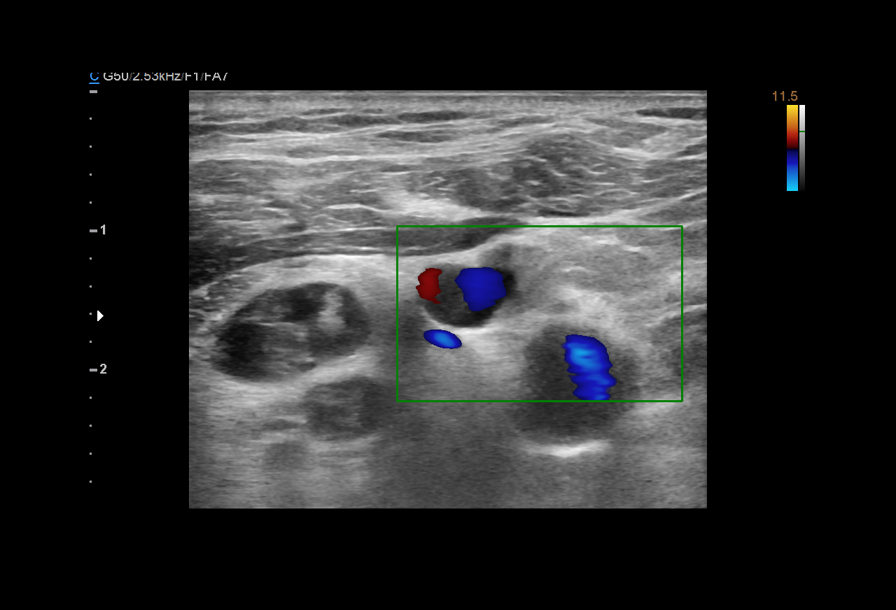
[im 2/4]
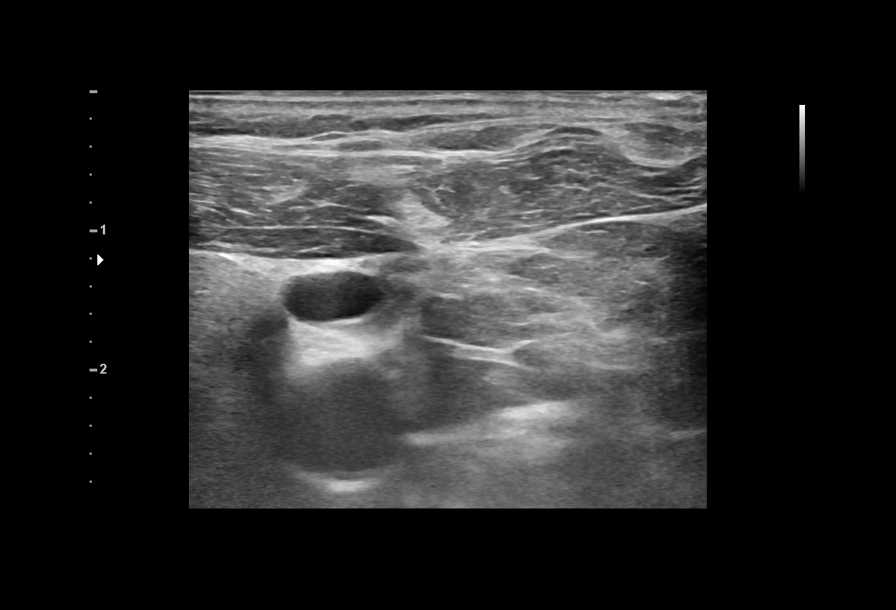
[im 3/4]
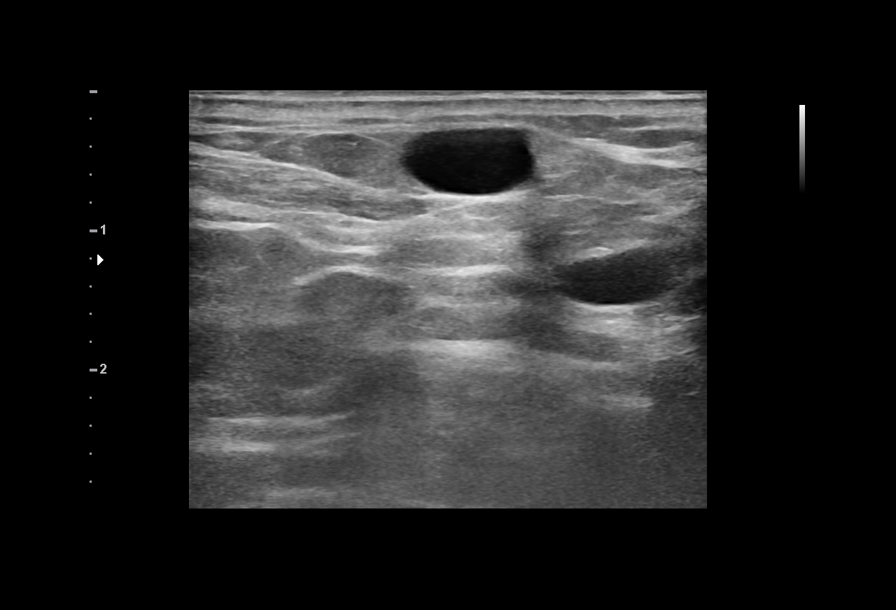
[im 4/4]
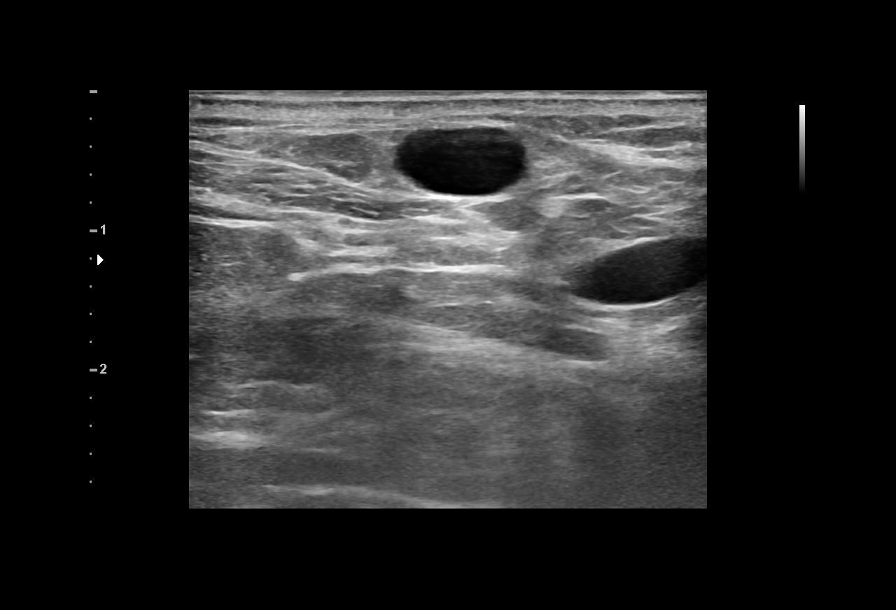

[Series 2: fl (-) angio · 1 of 1 slices shown]
[im 1/1]
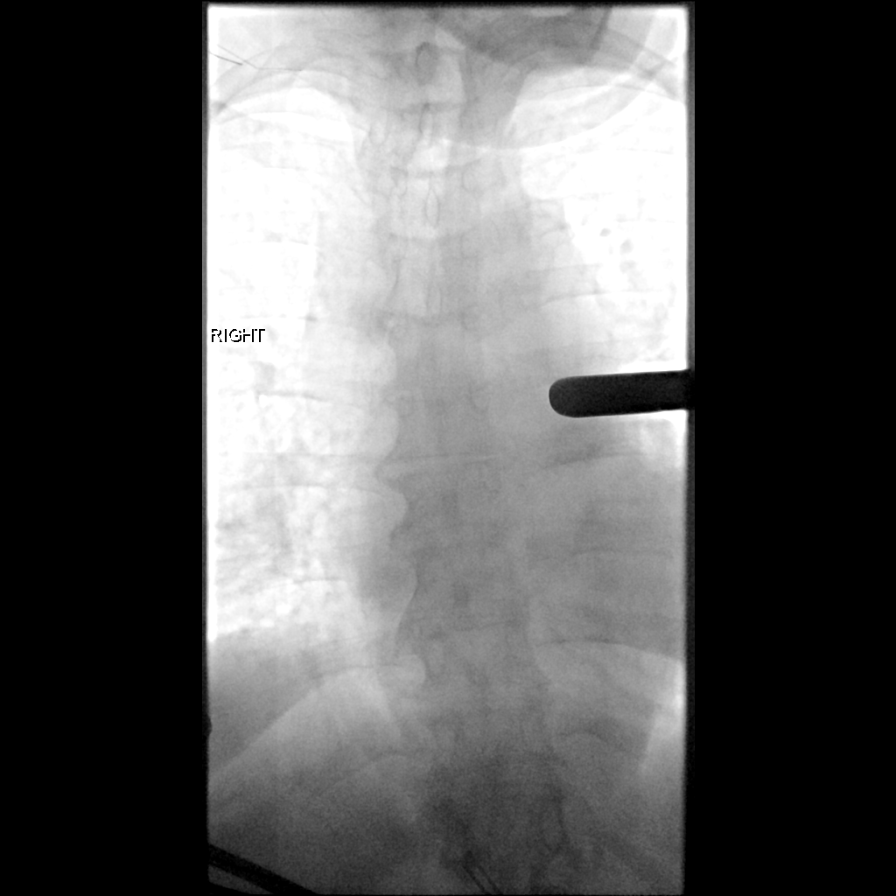

[5 of 5 positions shown; findings below may reference images not displayed]

EXAM:
1. Ultrasound evaluation of bilateral internal jugular veins.
2. Attempted ultrasound-guided access of right internal and external
jugular veins.

MEDICATIONS:
Ancef 2 g IV; The antibiotic was administered within an appropriate
time interval prior to skin puncture.

ANESTHESIA/SEDATION:
Moderate (conscious) sedation was employed during this procedure. A
total of Versed 1 mg and Fentanyl 50 mcg was administered
intravenously by the radiology nurse.

Total intra-service moderate Sedation Time: 16 minutes. The
patient's level of consciousness and vital signs were monitored
continuously by radiology nursing throughout the procedure under my
direct supervision.

FLUOROSCOPY:
Radiation Exposure Index (as provided by the fluoroscopic device): 7
mGy Kerma

COMPLICATIONS:
None immediate.

PROCEDURE:
Informed written consent was obtained from the patient after a
thorough discussion of the procedural risks, benefits and
alternatives. All questions were addressed. Maximal Sterile Barrier
Technique was utilized including caps, mask, sterile gowns, sterile
gloves, sterile drape, hand hygiene and skin antiseptic. A timeout
was performed prior to the initiation of the procedure.

Patient positioned supine on the procedure table. Ultrasound
evaluation of the left neck demonstrates thrombosed left internal
jugular vein.

Ultrasound evaluation of the right neck demonstrates partial
thrombosis of the right internal jugular vein. The external jugular
vein was patent.

Sterile ultrasound probe cover and gel utilized throughout the
procedure.

Using continuous ultrasound guidance, the right internal jugular
vein was accessed with a 21 gauge needle. However guidewire could
not be advanced centrally beyond the clavicle.

Using continuous ultrasound guidance, the right external jugular
vein was accessed with a 21 gauge needle. However guidewire could
not be advanced centrally beyond the clavicle.

Venogram could not be performed due to patient's dye allergy.

Right neck accesses were removed and hemostasis achieved with manual
compression.
IMPRESSION: 1. Thrombosed left internal jugular vein.
2. Partially thrombosed right internal jugular vein.
3. Both right internal and external jugular veins were accessed with
21 gauge needles, however guidewire could not be advanced beyond the
clavicle.
4. Venogram could not be performed due to patient's dye allergy.

PLAN:
1. IV dye allergy prep will be ordered.
2. Patient will be brought back tomorrow to once again attempt
tunneled dialysis catheter placement and venogram if necessary.

## 2022-05-20 MED ORDER — FENTANYL CITRATE (PF) 100 MCG/2ML IJ SOLN
INTRAMUSCULAR | Status: AC | PRN
  Administered 2022-05-20 (×2): 25 ug via INTRAVENOUS

## 2022-05-20 MED ORDER — SODIUM CHLORIDE 0.9 % IV SOLN: 12.5000 mg | Freq: Four times a day (QID) | INTRAVENOUS | Status: DC | PRN

## 2022-05-20 MED ORDER — DARBEPOETIN ALFA 60 MCG/0.3ML IJ SOSY: 60.0000 ug | PREFILLED_SYRINGE | INTRAMUSCULAR | Status: DC

## 2022-05-20 MED ORDER — LIDOCAINE HCL 1 % IJ SOLN
INTRAMUSCULAR | Status: AC
Start: 2022-05-20 — End: 2022-05-21
  Filled 2022-05-20: qty 20

## 2022-05-20 MED ORDER — CEFAZOLIN SODIUM-DEXTROSE 2-4 GM/100ML-% IV SOLN
INTRAVENOUS | Status: AC
  Filled 2022-05-20: qty 100

## 2022-05-20 MED ORDER — HEPARIN (PORCINE) 25000 UT/250ML-% IV SOLN
1300.0000 [IU]/h | INTRAVENOUS | Status: DC
  Administered 2022-05-20 – 2022-05-21 (×2): 1000 [IU]/h via INTRAVENOUS
  Administered 2022-05-22: 1400 [IU]/h via INTRAVENOUS
  Filled 2022-05-20 (×3): qty 250

## 2022-05-20 MED ORDER — POLYETHYLENE GLYCOL 3350 17 G PO PACK: 17.0000 g | PACK | Freq: Two times a day (BID) | ORAL | Status: DC | PRN

## 2022-05-20 MED ORDER — DIPHENHYDRAMINE HCL 50 MG/ML IJ SOLN: 12.5000 mg | Freq: Four times a day (QID) | INTRAMUSCULAR | Status: DC | PRN

## 2022-05-20 MED ORDER — DIPHENHYDRAMINE HCL 25 MG PO CAPS
50.0000 mg | ORAL_CAPSULE | Freq: Once | ORAL | Status: AC
Start: 2022-05-21 — End: 2022-05-21
  Administered 2022-05-21: 50 mg via ORAL
  Filled 2022-05-20: qty 2

## 2022-05-20 MED ORDER — KETOROLAC TROMETHAMINE 15 MG/ML IJ SOLN
30.0000 mg | Freq: Once | INTRAMUSCULAR | Status: AC
  Administered 2022-05-20: 30 mg via INTRAVENOUS
  Filled 2022-05-20: qty 2

## 2022-05-20 MED ORDER — FENTANYL CITRATE (PF) 100 MCG/2ML IJ SOLN
INTRAMUSCULAR | Status: AC
  Filled 2022-05-20: qty 2

## 2022-05-20 MED ORDER — CEFAZOLIN SODIUM-DEXTROSE 2-4 GM/100ML-% IV SOLN
2.0000 g | INTRAVENOUS | Status: AC
  Administered 2022-05-20: 2 g via INTRAVENOUS

## 2022-05-20 MED ORDER — MIDAZOLAM HCL 2 MG/2ML IJ SOLN
INTRAMUSCULAR | Status: AC
  Filled 2022-05-20: qty 4

## 2022-05-20 MED ORDER — MIDAZOLAM HCL 2 MG/2ML IJ SOLN
INTRAMUSCULAR | Status: AC | PRN
  Administered 2022-05-20 (×2): 1 mg via INTRAVENOUS

## 2022-05-20 MED ORDER — HEPARIN SODIUM (PORCINE) 1000 UNIT/ML IJ SOLN
INTRAMUSCULAR | Status: AC
  Filled 2022-05-20: qty 10

## 2022-05-20 MED ORDER — COLCHICINE 0.6 MG PO TABS
0.6000 mg | ORAL_TABLET | Freq: Every day | ORAL | Status: AC
  Administered 2022-05-20 – 2022-05-22 (×3): 0.6 mg via ORAL
  Filled 2022-05-20 (×3): qty 1

## 2022-05-20 MED ORDER — PREDNISONE 5 MG PO TABS
50.0000 mg | ORAL_TABLET | Freq: Four times a day (QID) | ORAL | Status: AC
  Administered 2022-05-20 – 2022-05-21 (×3): 50 mg via ORAL
  Filled 2022-05-20 (×3): qty 2

## 2022-05-20 NOTE — Final Progress Note (Signed)
PHARMACY CONSULT NOTE FOR:  OUTPATIENT  PARENTERAL ANTIBIOTIC THERAPY (OPAT)  Indication: MSSA Bacteremia Regimen: Cefazolin 2g IV with every HD session End date: 06/29/2022  No formal OPAT will be done as patient will receive antibiotics with dialysis. This will be arranged with dialysis center   Thank you for involving pharmacy in this patient's care.  Elita Quick, PharmD PGY1 Ambulatory Care Pharmacy Resident 05/20/2022 11:52 AM  **Pharmacist phone directory can be found on [email or web address] listed under [place]**

## 2022-05-20 NOTE — Progress Notes (Signed)
Occupational Therapy Treatment Patient Details Name: Whitney Walker MRN: [id number] DOB: 07-07-1956 Today's Date: 05/20/2022   History of present illness Pt is a 66 y.o. female admitted 5/18 with diagnosis of hypotension and fall. PMH: ESRD on HD MWF, COPD, CAD, DVT on chronic anticoagulation, HTN, OA with chronic left hip pain, depression, history of PE.   OT comments  Patient continues to make steady progress towards goals in skilled OT session. [name] session encompassed  functional ambulation, ADLs in standing, and increasing overall activity tolerance. Patient at min guard level for ADLs and functional ambulation. Education begun on fall prevention but patient needing to go to HD therefore education deferred. OT will continue to follow.    Recommendations for follow up therapy are one component of a multi-disciplinary discharge planning process, led by the attending physician.  Recommendations may be updated based on patient status, additional functional criteria and insurance authorization.    Follow Up Recommendations  Home health OT    Assistance Recommended at Discharge Frequent or constant Supervision/Assistance  Patient can return home with the following  A little help with walking and/or transfers;A little help with bathing/dressing/bathroom;Assist for transportation;Help with stairs or ramp for entrance;Assistance with cooking/housework   Equipment Recommendations  BSC/3in1    Recommendations for Other Services      Precautions / Restrictions Precautions Precautions: Fall;Other (comment) Precaution Comments: watch BP Restrictions Weight Bearing Restrictions: No       Mobility Bed Mobility Overal bed mobility: Needs Assistance Bed Mobility: Supine to Sit     Supine to sit: Min guard          Transfers Overall transfer level: Needs assistance Equipment used: Rolling walker (2 wheels) Transfers: Sit to/from Stand Sit to Stand: Min guard            General transfer comment: min guard due to minimal impulsivity     Balance Overall balance assessment: Needs assistance Sitting-balance support: No upper extremity supported, Feet supported Sitting balance-Leahy Scale: Good     Standing balance support: Bilateral upper extremity supported Standing balance-Leahy Scale: Fair Standing balance comment: reliant on RW for dynamic balance, fair statically                           ADL either performed or assessed with clinical judgement   ADL Overall ADL's : Needs assistance/impaired     Grooming: Wash/dry hands;Wash/dry face;Oral care;Set up;Standing                   Toilet Transfer: Min guard;Ambulation;Rolling walker (2 wheels)           Functional mobility during ADLs: Min guard;Rolling walker (2 wheels) General ADL Comments: Session focus on increasing activity tolerance    Extremity/Trunk Assessment              Vision       Perception     Praxis      Cognition Arousal/Alertness: Awake/alert Behavior During Therapy: WFL for tasks assessed/performed Overall Cognitive Status: Within Functional Limits for tasks assessed                                 General Comments: minimally tangential but very motivated        Exercises General Exercises - Lower Extremity Hip Flexion/Marching: AROM, Both, 15 reps, Seated (2 sets)    Shoulder Instructions       General  Comments      Pertinent Vitals/ Pain       Pain Assessment Pain Assessment: No/denies pain  Home Living                                          Prior Functioning/Environment              Frequency           Progress Toward Goals  OT Goals(current goals can now be found in the care plan section)  Progress towards OT goals: Progressing toward goals  Acute Rehab OT Goals OT Goal Formulation: With patient Time For Goal Achievement: 05/31/22 Potential to Achieve Goals: Good   Plan Discharge plan remains appropriate    Co-evaluation                 AM-PAC OT "6 Clicks" Daily Activity     Outcome Measure   Help from another person eating meals?: None Help from another person taking care of personal grooming?: A Little Help from another person toileting, which includes using toliet, bedpan, or urinal?: A Little Help from another person bathing (including washing, rinsing, drying)?: A Little Help from another person to put on and taking off regular upper body clothing?: A Little Help from another person to put on and taking off regular lower body clothing?: A Little 6 Click Score: 19    End of Session Equipment Utilized During Treatment: Rolling walker (2 wheels)  OT Visit Diagnosis: Unsteadiness on feet (R26.81);Other abnormalities of gait and mobility (R26.89);Pain;Muscle weakness (generalized) (M62.81)   Activity Tolerance Patient tolerated treatment well   Patient Left in bed;with call bell/phone within reach;Other (comment) (transport waiting to take patient to dialysis)   Nurse Communication          Time: [phone] OT Time Calculation (min): 15 min  Charges: OT General Charges $OT Visit: 1 Visit OT Treatments $Self Care/Home Management : 8-22 mins  Corinne Ports E. Dicky Boer, OTR/L Acute Rehabilitation Services [phone] [place] 05/20/2022, 3:41 PM

## 2022-05-20 NOTE — Progress Notes (Addendum)
Order placed for pre-med contrast allergy for IR procedure 05/21/22 per Dr. Dwaine Gale for attempt to place tunneled catheter for HD.     Narda Rutherford, AGNP-BC 05/20/2022, 4:43 PM

## 2022-05-20 NOTE — Progress Notes (Signed)
[facility]  Date of Admission:  05/16/2022      Total days of antibiotics 5    Cefazolin 5/19 >> c          ASSESSMENT: Whitney Walker is a 66 y.o. female patient here with MSSA bacteremia. She is ESRD on HD treatments via previous L HD line. Line has been pulled and out for 48h. Most recent blood cultures are without any growth and plan for replacement of HD access today.  TTE does not reveal any concern for endocarditis; given longer duration of treatment course don't feel we need further eval with TEE. She will need 6 week course of IV cefazolin after HD sessions for 6 weeks to treat possible septic arthritis of the left hip that was seeded during bacteremia. End date July 1st    FU with Dr. Gale Journey 07/04/22 '@9'$ :30 am    PLAN: Cefazolin 2 gm after HD through July 1st.  Will follow peripherally until blood cultures clear.   Will have her back for eval in clinic after she completes therapy. Please call if any changes in her condition.     Principal Problem:   Fall Active Problems:   Leucocytosis   Chronic diastolic heart failure (HCC)   Essential hypertension   Depression   Morbid (severe) obesity due to excess calories (HCC)   ESRD (end stage renal disease) ([place])   History of pulmonary embolism   Left hip pain   Tobacco abuse   Sepsis due to other specified Staphylococcus ([place])    apixaban  2.5 mg Oral BID   aspirin  81 mg Oral Daily   atorvastatin  10 mg Oral QPM   Chlorhexidine Gluconate Cloth  6 each Topical Q0600   colchicine  0.6 mg Oral Daily   darbepoetin (ARANESP) injection - DIALYSIS  60 mcg Intravenous Q Mon-HD   diclofenac Sodium  2 g Topical QID   ferric citrate  840 mg Oral TID WC   gabapentin  300 mg Oral Daily   midodrine  5 mg Oral Q M,W,F-HD   pantoprazole  40 mg Oral Daily    SUBJECTIVE: Doing well today. Her hip pain is improved with addition of topical agent. She feels overall much better. Anxious about  impending HD line replacement.    Review of Systems: Review of Systems  Constitutional:  Negative for chills and fever.  Respiratory: Negative.    Cardiovascular: Negative.   Gastrointestinal:  Negative for abdominal pain, nausea and vomiting.  Musculoskeletal:  Positive for joint pain. Falls: improved Lt hip pain. Skin:  Negative for rash.   Allergies  Allergen Reactions   Carnosine     Other reaction(s): Unknown   Gadolinium Derivatives Hives and Other (See Comments)    HIVES, Desc: HIVES W/ "DYE" USED FOR 1ST CT SCAN BUT NOT 2ND, NO PREMEDS USED, PT UNCERTAIN OF CIRCUMSTANCES,,?POSSIBLE MRI CONTRAST ALLERGY, ALL STUDIES DONE "SOMEWHERE" IN [place]//A.C., Onset Date: [id number]   Iohexol Hives and Other (See Comments)    Desc: HIVES W/ "DYE" USED FOR 1ST CT SCAN BUT NOT 2ND, NO PREMEDS USED, PT UNCERTAIN OF CIRCUMSTANCES,,?POSSIBLE MRI CONTRAST ALLERGY, ALL STUDIES DONE "SOMEWHERE" IN [place]//A.C., Onset Date: [id number]    Iodinated Contrast Media Hives   Iodine Hives   Naltrexone Other (See Comments)    Unknown    OBJECTIVE: Vitals:   05/19/22 1931 05/19/22 2308 05/20/22 0315 05/20/22 0801  BP: (!) 159/80 (!) 148/64 (!) 146/72 135/75  Pulse: 97 96  90  Resp: '16 17 18 17  '$ Temp: 98.7 F (37.1 C) 97.8 F (36.6 C) 97.8 F (36.6 C) 97.6 F (36.4 C)  TempSrc: Oral Oral Oral Oral  SpO2: 97% 96% 95% 97%  Weight:      Height:       Body mass index is 36.25 kg/m.  Physical Exam Vitals reviewed.  Constitutional:      Appearance: She is not ill-appearing.  HENT:     Mouth/Throat:     Mouth: Mucous membranes are moist.     Pharynx: Oropharynx is clear.  Cardiovascular:     Rate and Rhythm: Normal rate and regular rhythm.  Pulmonary:     Effort: Pulmonary effort is normal.     Breath sounds: Normal breath sounds.  Abdominal:     General: Bowel sounds are normal. There is no distension.     Palpations: Abdomen is soft.  Skin:    General: Skin is warm and  dry.     Capillary Refill: Capillary refill takes less than 2 seconds.     Comments: Left HD line removal site is clean and without signs of local infection.   Neurological:     Mental Status: She is alert and oriented to person, place, and time.    Lab Results Lab Results  Component Value Date   WBC 15.3 (H) 05/20/2022   HGB 9.1 (L) 05/20/2022   HCT 29.9 (L) 05/20/2022   MCV 96.8 05/20/2022   PLT 267 05/20/2022    Lab Results  Component Value Date   CREATININE 9.12 (H) 05/20/2022   BUN 36 (H) 05/20/2022   NA 134 (L) 05/20/2022   K 4.6 05/20/2022   CL 103 05/20/2022   CO2 19 (L) 05/20/2022    Lab Results  Component Value Date   ALT 14 05/17/2022   AST 25 05/17/2022   ALKPHOS 89 05/17/2022   BILITOT 0.8 05/17/2022     Microbiology: Recent Results (from the past 240 hour(s))  Resp Panel by RT-PCR (Flu A&B, Covid) Nasopharyngeal Swab     Status: None   Collection Time: 05/16/22  2:12 PM   Specimen: Nasopharyngeal Swab; Nasopharyngeal(NP) swabs in vial transport medium  Result Value Ref Range Status   SARS Coronavirus 2 by RT PCR NEGATIVE NEGATIVE Final    Comment: (NOTE) SARS-CoV-2 target nucleic acids are NOT DETECTED.  The SARS-CoV-2 RNA is generally detectable in upper respiratory specimens during the acute phase of infection. The lowest concentration of SARS-CoV-2 viral copies this assay can detect is 138 copies/mL. A negative result does not preclude SARS-Cov-2 infection and should not be used as the sole basis for treatment or other patient management decisions. A negative result may occur with  improper specimen collection/handling, submission of specimen other than nasopharyngeal swab, presence of viral mutation(s) within the areas targeted by this assay, and inadequate number of viral copies(<138 copies/mL). A negative result must be combined with clinical observations, patient history, and epidemiological information. The expected result is  Negative.  Fact Sheet for Patients:  EntrepreneurPulse.com.au  Fact Sheet for Healthcare Providers:  IncredibleEmployment.be  This test is no t yet approved or cleared by the [place] FDA and  has been authorized for detection and/or diagnosis of SARS-CoV-2 by FDA under an Emergency Use Authorization (EUA). This EUA will remain  in effect (meaning this test can be used) for the duration of the COVID-19 declaration under Section 564(b)(1) of the Act, 21 U.S.C.section 360bbb-3(b)(1), unless the authorization is  terminated  or revoked sooner.       Influenza A by PCR NEGATIVE NEGATIVE Final   Influenza B by PCR NEGATIVE NEGATIVE Final    Comment: (NOTE) The Xpert Xpress SARS-CoV-2/FLU/RSV plus assay is intended as an aid in the diagnosis of influenza from Nasopharyngeal swab specimens and should not be used as a sole basis for treatment. Nasal washings and aspirates are unacceptable for Xpert Xpress SARS-CoV-2/FLU/RSV testing.  Fact Sheet for Patients: EntrepreneurPulse.com.au  Fact Sheet for Healthcare Providers: IncredibleEmployment.be  This test is not yet approved or cleared by the [place] FDA and has been authorized for detection and/or diagnosis of SARS-CoV-2 by FDA under an Emergency Use Authorization (EUA). This EUA will remain in effect (meaning this test can be used) for the duration of the COVID-19 declaration under Section 564(b)(1) of the Act, 21 U.S.C. section 360bbb-3(b)(1), unless the authorization is terminated or revoked.  Performed at [facility], [place] [address], [place], [place] [address]   Culture, blood (routine x 2)     Status: Abnormal   Collection Time: 05/16/22  5:40 PM   Specimen: BLOOD  Result Value Ref Range Status   Specimen Description   Final    BLOOD SITE NOT SPECIFIED Performed at [facility], [address] [place], [place]  [address]    Special Requests   Final    BOTTLES DRAWN AEROBIC AND ANAEROBIC Blood Culture adequate volume Performed at [place] [address], [place], [place] [address]    Culture  Setup Time   Final    GRAM POSITIVE COCCI IN CLUSTERS AEROBIC BOTTLE ONLY CRITICAL RESULT CALLED TO, READ BACK BY AND VERIFIED WITH: Dale Pierce [id number] FCP Performed at [facility], [place] [address], [place], [place] [address]    Culture STAPHYLOCOCCUS AUREUS (A)  Final   Report Status 05/19/2022 FINAL  Final   Organism ID, Bacteria STAPHYLOCOCCUS AUREUS  Final      Susceptibility   Staphylococcus aureus - MIC*    CIPROFLOXACIN <=0.5 SENSITIVE Sensitive     ERYTHROMYCIN <=0.25 SENSITIVE Sensitive     GENTAMICIN <=0.5 SENSITIVE Sensitive     OXACILLIN <=0.25 SENSITIVE Sensitive     TETRACYCLINE <=1 SENSITIVE Sensitive     VANCOMYCIN <=0.5 SENSITIVE Sensitive     TRIMETH/SULFA <=10 SENSITIVE Sensitive     CLINDAMYCIN <=0.25 SENSITIVE Sensitive     RIFAMPIN <=0.5 SENSITIVE Sensitive     Inducible Clindamycin NEGATIVE Sensitive     * STAPHYLOCOCCUS AUREUS  Culture, blood (routine x 2)     Status: Abnormal   Collection Time: 05/16/22  5:40 PM   Specimen: BLOOD  Result Value Ref Range Status   Specimen Description   Final    BLOOD Performed at [facility] [address], [place], [place] [address]    Special Requests   Final    BOTTLES DRAWN AEROBIC AND ANAEROBIC Blood Culture results may not be optimal due to an excessive volume of blood received in culture bottles Performed at [facility] [address], [place], [place] [address]    Culture  Setup Time   Final    GRAM POSITIVE COCCI IN CLUSTERS IN BOTH AEROBIC AND ANAEROBIC BOTTLES    Culture (A)  Final    STAPHYLOCOCCUS AUREUS SUSCEPTIBILITIES PERFORMED ON PREVIOUS CULTURE WITHIN THE LAST 5 DAYS. Performed at [facility], [place] [address], [place], [place]  [address]    Report Status  05/19/2022 FINAL  Final  Blood Culture ID Panel (Reflexed)     Status: Abnormal   Collection Time: 05/16/22  5:40 PM  Result Value Ref Range Status   Enterococcus faecalis NOT DETECTED NOT DETECTED Final   Enterococcus Faecium NOT DETECTED NOT DETECTED Final   Listeria monocytogenes NOT DETECTED NOT DETECTED Final   Staphylococcus species DETECTED (A) NOT DETECTED Final    Comment: CRITICAL RESULT CALLED TO, READ BACK BY AND VERIFIED WITH: PHARMD ALEX L [phone] FCP    Staphylococcus aureus (BCID) DETECTED (A) NOT DETECTED Final    Comment: CRITICAL RESULT CALLED TO, READ BACK BY AND VERIFIED WITH: PHARMD ALEX L [id number] FCP    Staphylococcus epidermidis NOT DETECTED NOT DETECTED Final   Staphylococcus lugdunensis NOT DETECTED NOT DETECTED Final   Streptococcus species NOT DETECTED NOT DETECTED Final   Streptococcus agalactiae NOT DETECTED NOT DETECTED Final   Streptococcus pneumoniae NOT DETECTED NOT DETECTED Final   Streptococcus pyogenes NOT DETECTED NOT DETECTED Final   A.calcoaceticus-baumannii NOT DETECTED NOT DETECTED Final   Bacteroides fragilis NOT DETECTED NOT DETECTED Final   Enterobacterales NOT DETECTED NOT DETECTED Final   Enterobacter cloacae complex NOT DETECTED NOT DETECTED Final   Escherichia coli NOT DETECTED NOT DETECTED Final   Klebsiella aerogenes NOT DETECTED NOT DETECTED Final   Klebsiella oxytoca NOT DETECTED NOT DETECTED Final   Klebsiella pneumoniae NOT DETECTED NOT DETECTED Final   Proteus species NOT DETECTED NOT DETECTED Final   Salmonella species NOT DETECTED NOT DETECTED Final   Serratia marcescens NOT DETECTED NOT DETECTED Final   Haemophilus influenzae NOT DETECTED NOT DETECTED Final   Neisseria meningitidis NOT DETECTED NOT DETECTED Final   Pseudomonas aeruginosa NOT DETECTED NOT DETECTED Final   Stenotrophomonas maltophilia NOT DETECTED NOT DETECTED Final   Candida albicans NOT DETECTED NOT DETECTED Final   Candida  auris NOT DETECTED NOT DETECTED Final   Candida glabrata NOT DETECTED NOT DETECTED Final   Candida krusei NOT DETECTED NOT DETECTED Final   Candida parapsilosis NOT DETECTED NOT DETECTED Final   Candida tropicalis NOT DETECTED NOT DETECTED Final   Cryptococcus neoformans/gattii NOT DETECTED NOT DETECTED Final   Meth resistant mecA/C and MREJ NOT DETECTED NOT DETECTED Final    Comment: Performed at [facility], [address] [place], [place] [address]  Culture, blood (Routine X 2) w Reflex to ID Panel     Status: None (Preliminary result)   Collection Time: 05/18/22  4:33 AM   Specimen: BLOOD LEFT ARM  Result Value Ref Range Status   Specimen Description BLOOD LEFT ARM  Final   Special Requests   Final    AEROBIC BOTTLE ONLY Blood Culture results may not be optimal due to an inadequate volume of blood received in culture bottles   Culture   Final    NO GROWTH 2 DAYS Performed at [facility], [place] [address], [place], [place] [address]    Report Status PENDING  Incomplete  Culture, blood (Routine X 2) w Reflex to ID Panel     Status: None (Preliminary result)   Collection Time: 05/18/22  4:45 AM   Specimen: BLOOD LEFT HAND  Result Value Ref Range Status   Specimen Description BLOOD LEFT HAND  Final   Special Requests   Final    AEROBIC BOTTLE ONLY Blood Culture results may not be optimal due to an inadequate volume of blood received in culture bottles   Culture   Final    NO GROWTH  2 DAYS Performed at [facility], [place] [address], [place], [place] [address]    Report Status PENDING  Incomplete    Janene Madeira, MSN, NP-C [facility] [email or web address] Pager: [phone] Office: [phone] [facility] Main Line: [place] Communication Welcome

## 2022-05-20 NOTE — Procedures (Signed)
Interventional Radiology Procedure Note  Procedure: Attempted tunneled IJ HD catheter placement  Indication: Renal failure  Findings: Please refer to procedural dictation for full description.  Complications: None  EBL: < 10 mL  Whitney Roux, MD [phone]

## 2022-05-20 NOTE — Progress Notes (Signed)
Physical Therapy Treatment Patient Details Name: Whitney Walker MRN: [id number] DOB: 19-Aug-1956 Today's Date: 05/20/2022   History of Present Illness Pt is a 66 y.o. female admitted 5/18 with diagnosis of hypotension and fall. PMH: ESRD on HD MWF, COPD, CAD, DVT on chronic anticoagulation, HTN, OA with chronic left hip pain, depression, history of PE.    PT Comments    Patient pre-medicated for pain and tolerated session very well. She was able to ambulate 160 ft with RW and supervision (cues only). Educated on gentle stretch and exercise to perform for left glutes (especially when in dialysis chair for 3.5 hours to prevent stiffness/pain).     Recommendations for follow up therapy are one component of a multi-disciplinary discharge planning process, led by the attending physician.  Recommendations may be updated based on patient status, additional functional criteria and insurance authorization.  Follow Up Recommendations  Home health PT (pt has goal to walk without device)     Assistance Recommended at Discharge PRN  Patient can return home with the following Assistance with cooking/housework;Help with stairs or ramp for entrance   Equipment Recommendations  None recommended by PT    Recommendations for Other Services       Precautions / Restrictions Precautions Precautions: Fall;Other (comment) Precaution Comments: watch BP Restrictions Weight Bearing Restrictions: No     Mobility  Bed Mobility                    Transfers Overall transfer level: Modified independent Equipment used: Rolling walker (2 wheels) Transfers: Sit to/from Stand Sit to Stand: Modified independent (Device/Increase time)           General transfer comment: uses one hand on RW, but does so safely (repeated x 2)    Ambulation/Gait Ambulation/Gait assistance: Supervision Gait Distance (Feet): 160 Feet Assistive device: Rolling walker (2 wheels) Gait Pattern/deviations:  Step-through pattern, Decreased stride length Gait velocity: decreased     General Gait Details: vc for taking weight through bil UEs to decr strain on left glutes; pt reported this makes her hands hurt and prefers to walk with minimal weight through her hands   Stairs             Wheelchair Mobility    Modified Rankin (Stroke Patients Only)       Balance Overall balance assessment: Needs assistance Sitting-balance support: No upper extremity supported, Feet supported Sitting balance-Leahy Scale: Good     Standing balance support: No upper extremity supported Standing balance-Leahy Scale: Fair Standing balance comment: reliant on RW for dynamic balance, fair statically                            Cognition Arousal/Alertness: Awake/alert Behavior During Therapy: WFL for tasks assessed/performed Overall Cognitive Status: Within Functional Limits for tasks assessed                                          Exercises Other Exercises Other Exercises: Educated on fact she has strain of posterior hip muscles. Educated on seated and supine gentle stretch (hands on knees and gentle lean forward until she feels a mild stretch x 15 seconds vs knee to chest). Pt reports increased pain on left when moving RLE in knee to chest motion--likely due to stabilization on left side and educated not to do this exercise on  the right.    General Comments        Pertinent Vitals/Pain Pain Assessment Pain Assessment: 0-10 Pain Score: 7  Pain Location: L posterior hip Pain Descriptors / Indicators: Discomfort, Sore Pain Intervention(s): Limited activity within patient's tolerance, Monitored during session, Premedicated before session, Other (comment) (gentle stretches)    Home Living                          Prior Function            PT Goals (current goals can now be found in the care plan section) Acute Rehab PT Goals Patient Stated Goal:  manage L hip pain PT Goal Formulation: With patient Time For Goal Achievement: 05/31/22 Potential to Achieve Goals: Good Progress towards PT goals: Progressing toward goals    Frequency    Min 3X/week      PT Plan Current plan remains appropriate    Co-evaluation              AM-PAC PT "6 Clicks" Mobility   Outcome Measure  Help needed turning from your back to your side while in a flat bed without using bedrails?: None Help needed moving from lying on your back to sitting on the side of a flat bed without using bedrails?: A Little Help needed moving to and from a bed to a chair (including a wheelchair)?: A Little Help needed standing up from a chair using your arms (e.g., wheelchair or bedside chair)?: A Little Help needed to walk in hospital room?: A Little Help needed climbing 3-5 steps with a railing? : A Little 6 Click Score: 19    End of Session   Activity Tolerance: Patient tolerated treatment well Patient left: with call bell/phone within reach;in chair Nurse Communication: Mobility status PT Visit Diagnosis: Difficulty in walking, not elsewhere classified (R26.2)     Time: 7893-8101 PT Time Calculation (min) (ACUTE ONLY): 29 min  Charges:  $Gait Training: 8-22 mins $Therapeutic Exercise: 8-22 mins                      Arby Barrette, PT Acute Rehabilitation Services  Pager [phone] Office [phone]    Rexanne Mano 05/20/2022, 8:59 AM

## 2022-05-20 NOTE — Care Management Important Message (Signed)
Important Message  Patient Details  Name: Whitney Walker MRN: [id number] Date of Birth: [birth year]   Medicare Important Message Given:  Yes     Orbie Pyo 05/20/2022, 3:56 PM

## 2022-05-20 NOTE — Progress Notes (Signed)
ANTICOAGULATION CONSULT NOTE - Initial Consult  Pharmacy Consult for Heparin Indication: DVT  Allergies  Allergen Reactions   Carnosine     Other reaction(s): Unknown   Gadolinium Derivatives Hives and Other (See Comments)    HIVES, Desc: HIVES W/ "DYE" USED FOR 1ST CT SCAN BUT NOT 2ND, NO PREMEDS USED, PT UNCERTAIN OF CIRCUMSTANCES,,?POSSIBLE MRI CONTRAST ALLERGY, ALL STUDIES DONE "SOMEWHERE" IN [place]//A.C., Onset Date: [id number]   Iohexol Hives and Other (See Comments)    Desc: HIVES W/ "DYE" USED FOR 1ST CT SCAN BUT NOT 2ND, NO PREMEDS USED, PT UNCERTAIN OF CIRCUMSTANCES,,?POSSIBLE MRI CONTRAST ALLERGY, ALL STUDIES DONE "SOMEWHERE" IN [place]//A.C., Onset Date: [id number]    Iodinated Contrast Media Hives   Iodine Hives   Naltrexone Other (See Comments)    Unknown    Patient Measurements: Height: '5\' 2"'$  (157.5 cm) Weight: 89.9 kg (198 lb 3.1 oz) IBW/kg (Calculated) : 50.1 Heparin Dosing Weight: 72 kg  Vital Signs: Temp: 98.3 F (36.8 C) (05/22 1202) Temp Source: Oral (05/22 1202) BP: 136/67 (05/22 1415) Pulse Rate: 92 (05/22 1415)  Labs: Recent Labs    05/18/22 0433 05/19/22 2007 05/20/22 0131  HGB 9.8* 9.5* 9.1*  HCT 31.3* 29.9* 29.9*  PLT 250 276 267  CREATININE 5.49* 9.05* 9.12*    Estimated Creatinine Clearance: 6.3 mL/min (A) (by C-G formula based on SCr of 9.12 mg/dL (H)).   Medical History: Past Medical History:  Diagnosis Date   Anemia    Anxiety    CHF (congestive heart failure) ([place])    Colon cancer ([place])    treatment surgery   Complication of anesthesia    after first [facility]- [place] "couldnt walk after", patient denies having a spinal   COPD (chronic obstructive pulmonary disease) ([place])    Coronary artery disease    Depression    DVT (deep venous thrombosis) ([place])    ESRD on hemodialysis ([place])    Hemo: MWF   History of blood transfusion 04/2018   Hypertension    07/07/18- no longer takes BP medications   Meningitis    Pain in limb  07/30/2013   PE (pulmonary embolism)    Peripheral vascular disease ([facility])    Restless legs    Shortness of breath    with exertion   Sleep apnea    SOB (shortness of breath) 03/03/2019   Vertigo     Medications:  Medications Prior to Admission  Medication Sig Dispense Refill Last Dose   acetaminophen (TYLENOL) 650 MG CR tablet Take 1,950-2,600 mg by mouth 2 (two) times daily as needed for pain. Do not exceed more than '4000mg'$  (or 6 capsules) in 24hrs.   05/16/2022   albuterol (VENTOLIN HFA) 108 (90 Base) MCG/ACT inhaler Inhale 2 puffs into the lungs every 6 (six) hours as needed for wheezing or shortness of breath. 18 g 0 05/14/2022   Ascorbic Acid (VITAMIN C) 100 MG tablet Take 100 mg by mouth daily.   05/15/2022   aspirin 81 MG chewable tablet Chew 1 tablet (81 mg total) by mouth daily. 30 tablet 0 05/15/2022   atorvastatin (LIPITOR) 10 MG tablet TAKE ONE TABLET BY MOUTH ONCE DAILY (Patient taking differently: Take 10 mg by mouth every evening.) 30 tablet 3 05/15/2022   AURYXIA 1 GM 210 MG(Fe) tablet Take 420-840 mg by mouth See admin instructions. Take '840mg'$  by mouth three times daily with meals and '420mg'$  with snacks.   05/15/2022   Biotin 10000 MCG TABS Take 10,000 mcg by mouth daily.  05/15/2022   ELIQUIS 2.5 MG TABS tablet TAKE ONE TABLET BY MOUTH TWICE DAILY (Patient taking differently: Take 2.5 mg by mouth 2 (two) times daily.) 60 tablet 2 05/15/2022 at 9.30 pm   gabapentin (NEURONTIN) 300 MG capsule Take 300 mg by mouth daily.   05/15/2022   meclizine (ANTIVERT) 12.5 MG tablet Take 1 tablet (12.5 mg total) by mouth 3 (three) times daily as needed for dizziness. 30 tablet 0 05/02/2022   metoprolol succinate (TOPROL-XL) 25 MG 24 hr tablet Take 1 tablet (25 mg total) by mouth daily. 30 tablet 1 05/15/2022 at 10 am   midodrine (PROAMATINE) 10 MG tablet Take 5 mg by mouth every Monday, Wednesday, and Friday with hemodialysis.   05/15/2022   multivitamin (RENA-VIT) TABS tablet Take 1 tablet by mouth  every evening.   05/15/2022   omeprazole (PRILOSEC) 20 MG capsule TAKE ONE CAPSULE BY MOUTH ONCE DAILY (Patient taking differently: Take 20 mg by mouth daily.) 30 capsule 0 05/15/2022   oxyCODONE-acetaminophen (PERCOCET) 5-325 MG tablet Take 1 tablet by mouth every 4 (four) hours as needed for severe pain. 30 tablet 0 05/15/2022   vitamin E 200 UNIT capsule Take 200 Units by mouth daily.   05/15/2022   cyclobenzaprine (FLEXERIL) 10 MG tablet Take 1 tablet (10 mg total) by mouth 3 (three) times daily as needed for muscle spasms. (Patient not taking: Reported on 05/16/2022) 30 tablet 1 Not Taking    Assessment: 66 y.o. F presented with. Pt on Eliquis PTA for h/o recurrent VTE - continued this admission. Eliquis 2.'5mg'$  taken 5/22 0820. Pt went to IR for [facility] placment and IJ thrombosis found. Plan to hold Eliquis and start heparin per pharmacy and retry [facility] placement tomorrow. Eliquis will be affecting heparin levels so will utilize aPTT until levels correlate. Hgb 9.1, plt wnl.  Goal of Therapy:  Heparin level 0.3-0.7 units/ml; aPTT 66-102 sec Monitor platelets by anticoagulation protocol: Yes   Plan:  D/c apixaban Start heparin at 1000 units/hr (will go ahead and start now as pt with IJ thrombosis on apixaban) 8 hr aPTT and heparin level Daily aPTT and heparin level  Sherlon Handing, PharmD, BCPS Please see amion for complete clinical pharmacist phone list 05/20/2022,4:09 PM

## 2022-05-20 NOTE — Progress Notes (Signed)
PROGRESS NOTE        PATIENT DETAILS Name: Whitney Walker Age: 66 y.o. Sex: female Date of Birth: 1956-02-28 Admit Date: 05/16/2022 Admitting Physician Donne Hazel, MD XIP:[id number], Doreene Burke, FNP  Brief Summary: Patient is a 66 y.o.  female with history of ESRD on HD MWF, VTE on Eliquis, chronic left hip pain-referred to the ED by oncologist for evaluation of tension/chills and a mechanical fall.  She was found to have significant leukocytosis-and subsequently admitted to the hospitalist service.   Post admission-blood cultures (prelim) with positive for MSSA.  Significant events: 5/3-5/5>> Hospitalized for malfunctioning HD catheter (exchanged by IR) and NSTEMI-LHC with small vessel disease 5/18>>Admit for chills/hypotension/mechanical fall-blood cultures +ve for MSSA  Significant studies: 5/18>> CXR: No PNA 5/18>> CT head: No acute intracranial abnormality 5/18>> MRI left hip: Abnormal edema in the left sacral alla-possible infiltrative-possible stress fracture. 5/21 >> Echocardiogram - 1. Left ventricular ejection fraction, by estimation, is 70 to 75%. The left ventricle has hyperdynamic function. The left ventricle has no regional wall motion abnormalities. There is moderate left ventricular hypertrophy. Left ventricular diastolic parameters are consistent with Grade I diastolic dysfunction (impaired relaxation).  2. Right ventricular systolic function is normal. The right ventricular size is normal.  3. No vegetation seen. The mitral valve is abnormal. No evidence of mitral valve regurgitation. No evidence of mitral stenosis. Moderate mitral annular calcification. No vegetation seen.  4. Increased transvalvular gradient (max 42 mmHg) likely a combination of LVOT obstruction and possibly mild aortic valvular stenosis.     No vegetation seen.. The aortic valve is tricuspid. There is mild calcification of the aortic valve. Aortic valve regurgitation is not  visualized. No vegetation seen  5. No signifcaitn change compared to outpatient study in 03/2022. 5/22 >> due for new tunneled catheter placement for dialysis  Significant microbiology data: 5/18>> COVID/influenza PCR: Negative 5/18>> blood culture: Gram-positive cocci (MSSA on BCID)  Procedures: 5/3>> HD catheter exchanged by IR. 5/4>> left heart catheterization by Dr. Einar Gip  Consults: ID, nephrology, orthopedics Dr. Mable Fill  Subjective:   Patient in bed, appears comfortable, denies any headache, no fever, no chest pain or pressure, no shortness of breath , no abdominal pain. No new focal weakness. +ve L. Hip pain.   Objective: Vitals: Blood pressure 135/75, pulse 90, temperature 97.6 F (36.4 C), temperature source Oral, resp. rate 17, height '5\' 2"'$  (1.575 m), weight 89.9 kg, SpO2 97 %.   Exam:  Awake Alert, No new F.N deficits, Normal affect [place].AT,PERRAL Supple Neck, No JVD,   Symmetrical Chest wall movement, Good air movement bilaterally, CTAB RRR,No Gallops, Rubs or new Murmurs,  +ve B.Sounds, Abd Soft, No tenderness,   No Cyanosis, Clubbing or edema   Assessment/Plan:  Sepsis due to MSSA bacteremia: BP improved-culture data as above-obtain echo. She does have a HD catheter-catheter site appears benign without any obvious discharge.  HD catheter was recently exchanged by IR on 5/3 during her prior hospitalization.  Catheter again removed on 05/17/2022 by IR with possible new catheter placement on 05/20/2022, ID and nephrology directing catheter care and placement, seen by orthopedics due to questionable CT of the left hip.  Echocardiogram stable.  Acute on chronic left-sided hip pain: Left hip MRI noted with possible left hip stress fracture, muscle strain and possible osteomyelitis/infection.  Seen by orthopedics Dr. Mable Fill and IR, not enough fluid  to be tapped, her pain is on the left lateral side around the gluteal tuberosity area, most likely her pain is due to left  trochanteric bursitis will give her a trial of colchicine, Toradol and Bengay cream will request orthopedics to consider trochanteric bursa steroid shot if they think it is beneficial.  Does not seem to be hip joint related pain.  Hypotension: Resolved-suspect this was likely due to sepsis/MSSA bacteremia-in recent diarrheal illness during Mother's Day weekend (resolved).  ESRD on HD MWF: Nephrology following.  Normocytic anemia: Due to ESRD-defer Aranesp/IV iron to nephrology.  Hyperkalemia: Mild-for HD later today.  Repeat electrolytes tomorrow.  History of recurrent VTE: On Eliquis.  Recent non-STEMI: No chest pain-recent LHC negative for large vessel disease-suspected to have small vessel disease.  Cardiology recommended aspirin x30 days along with Eliquis.  Chronic HFpEF: Euvolemic-volume removal with HD  HTN: BP soft-continue to hold Coreg  HLD: On statin  History of PAD s/p left femoral popliteal bypass in 2014  History of colon cancer: Follows with oncology/Dr. Collene Mares in the outpatient setting-in remission   OSA: Apparently not on CPAP at home.  Tobacco abuse: Counseled    Obesity: Estimated body mass index is 36.25 kg/m as calculated from the following:   Height as of this encounter: '5\' 2"'$  (1.575 m).   Weight as of this encounter: 89.9 kg.   Code status:   Code Status: Full Code   DVT Prophylaxis: apixaban (ELIQUIS) tablet 2.5 mg Start: 05/16/22 2200 apixaban (ELIQUIS) tablet 2.5 mg    Family Communication: Daughter-Laticia Roselli-[phone]-left voicemail on 5/19   Disposition Plan: Status is: Inpatient Remains inpatient appropriate because: Sepsis due to MSSA bacteremia-noted stable for discharge.   Planned Discharge Destination:Home   Diet: Diet Order             Diet NPO time specified Except for: Sips with Meds  Diet effective now                   MEDICATIONS: Scheduled Meds:  apixaban  2.5 mg Oral BID   aspirin  81 mg Oral Daily    atorvastatin  10 mg Oral QPM   Chlorhexidine Gluconate Cloth  6 each Topical Q0600   colchicine  0.6 mg Oral Daily   diclofenac Sodium  2 g Topical QID   ferric citrate  840 mg Oral TID WC   gabapentin  300 mg Oral Daily   midodrine  5 mg Oral Q M,W,F-HD   pantoprazole  40 mg Oral Daily   Continuous Infusions:   ceFAZolin (ANCEF) IV 1 g (05/19/22 1746)    ceFAZolin (ANCEF) IV     PRN Meds:.acetaminophen **OR** acetaminophen, albuterol, diphenhydrAMINE, ferric citrate, HYDROmorphone (DILAUDID) injection, oxyCODONE   I have personally reviewed following labs and imaging studies  LABORATORY DATA: Recent Labs  Lab 05/16/22 1407 05/17/22 0047 05/18/22 0433 05/19/22 2007 05/20/22 0131  WBC 25.1* 22.0* 16.9* 14.7* 15.3*  HGB 10.9* 10.8* 9.8* 9.5* 9.1*  HCT 33.4* 32.6* 31.3* 29.9* 29.9*  PLT 269 192 250 276 267  MCV 95.2 92.1 95.4 94.6 96.8  MCH 31.1 30.5 29.9 30.1 29.4  MCHC 32.6 33.1 31.3 31.8 30.4  RDW 15.9* 15.4 15.7* 14.9 15.2  LYMPHSABS  --   --   --   --  2.0  MONOABS  --   --   --   --  1.3*  EOSABS  --   --   --   --  0.4  BASOSABS  --   --   --   --  0.0    Recent Labs  Lab 05/16/22 1407 05/16/22 1740 05/17/22 0047 05/18/22 0433 05/19/22 2007 05/20/22 0131  NA 134*  --  130* 135 131* 134*  K 5.1  --  5.4* 5.0 4.2 4.6  CL 101  --  100 102 100 103  CO2 20*  --  15* 20* 18* 19*  GLUCOSE 122*  --  111* 100* 145* 122*  BUN 41*  --  47* 24* 36* 36*  CREATININE 8.21*  --  9.04* 5.49* 9.05* 9.12*  CALCIUM 8.8*  --  8.6* 8.4* 8.5* 8.7*  AST 21  --  25  --   --   --   ALT 16  --  14  --   --   --   ALKPHOS 109  --  89  --   --   --   BILITOT 0.6  --  0.8  --   --   --   ALBUMIN 2.5*  --  2.0* 2.0* 2.1* 2.0*  PHOS  --   --   --  2.9 3.8 3.9  LATICACIDVEN  --  1.1 1.1  --   --   --   INR 1.2  --   --   --   --   --     RADIOLOGY STUDIES/RESULTS: ECHOCARDIOGRAM COMPLETE  Result Date: 05/19/2022    ECHOCARDIOGRAM REPORT   Patient Name:   [facility]  Date of Exam: 05/18/2022 Medical Rec #:  [id number]            Height:       62.0 in Accession #:    [id number]           Weight:       198.2 lb Date of Birth:  July 20, 1956             BSA:          1.904 m Patient Age:    68 years             BP:           109/71 mmHg Patient Gender: F                    HR:           95 bpm. Exam Location:  Inpatient Procedure: 2D Echo, Cardiac Doppler and Color Doppler Indications:     Endocarditis  History:         Patient has prior history of Echocardiogram examinations, most                  recent 07/17/2021. CHF, CAD, COPD; Risk Factors:Hypertension and                  Diabetes.  Sonographer:     Merrie Roof RDCS Referring Phys:  [place] Diagnosing Phys: Vernell Leep MD IMPRESSIONS  1. Left ventricular ejection fraction, by estimation, is 70 to 75%. The left ventricle has hyperdynamic function. The left ventricle has no regional wall motion abnormalities. There is moderate left ventricular hypertrophy. Left ventricular diastolic parameters are consistent with Grade I diastolic dysfunction (impaired relaxation).  2. Right ventricular systolic function is normal. The right ventricular size is normal.  3. No vegetation seen. The mitral valve is abnormal. No evidence of mitral valve regurgitation. No evidence of mitral stenosis. Moderate mitral annular calcification. No vegetation seen.  4. Increased transvalvular gradient (max 42 mmHg) likely a combination of LVOT  obstruction and possibly mild aortic valvular stenosis.     No vegetation seen.. The aortic valve is tricuspid. There is mild calcification of the aortic valve. Aortic valve regurgitation is not visualized. No vegetation seen  5. No signifcaitn change compared to outpatient study in 03/2022. FINDINGS  Left Ventricle: Left ventricular ejection fraction, by estimation, is 70 to 75%. The left ventricle has hyperdynamic function. The left ventricle has no regional wall motion abnormalities. The left  ventricular internal cavity size was small. There is moderate left ventricular hypertrophy. Left ventricular diastolic parameters are consistent with Grade I diastolic dysfunction (impaired relaxation). Right Ventricle: The right ventricular size is normal. No increase in right ventricular wall thickness. Right ventricular systolic function is normal. Left Atrium: Left atrial size was normal in size. Right Atrium: Right atrial size was not assessed. Pericardium: There is no evidence of pericardial effusion. Mitral Valve: No vegetation seen. The mitral valve is abnormal. Moderate mitral annular calcification. No evidence of mitral valve regurgitation. No evidence of mitral valve stenosis. MV peak gradient, 21.9 mmHg. The mean mitral valve gradient is 9.0 mmHg. Tricuspid Valve: No vegetation seen. The tricuspid valve is normal in structure. Tricuspid valve regurgitation is not demonstrated. No evidence of tricuspid stenosis. Aortic Valve: Increased transvalvular gradient (max 42 mmHg) likely a combination of LVOT obstruction and possibly mild aortic valvular stenosis. No vegetation seen. The aortic valve is tricuspid. There is mild calcification of the aortic valve. Aortic valve regurgitation is not visualized. Pulmonic Valve: The pulmonic valve was grossly normal. Pulmonic valve regurgitation is mild. Aorta: The aortic root is normal in size and structure. Pulmonary Artery: No vegetation seen. IAS/Shunts: The interatrial septum was not assessed.  LEFT VENTRICLE PLAX 2D LVIDd:         3.50 cm   Diastology LVIDs:         2.10 cm   LV e' medial:    6.53 cm/s LV PW:         1.50 cm   LV E/e' medial:  15.3 LV IVS:        1.30 cm   LV e' lateral:   8.27 cm/s LVOT diam:     1.80 cm   LV E/e' lateral: 12.1 LV SV:         75 LV SV Index:   39 LVOT Area:     2.54 cm  RIGHT VENTRICLE RV S prime:     13.40 cm/s TAPSE (M-mode): 1.9 cm LEFT ATRIUM             Index LA diam:        3.90 cm 2.05 cm/m LA Vol (A2C):   49.0 ml 25.73  ml/m LA Vol (A4C):   62.8 ml 32.98 ml/m LA Biplane Vol: 60.5 ml 31.77 ml/m  AORTIC VALVE LVOT Vmax:   142.00 cm/s LVOT Vmean:  106.000 cm/s LVOT VTI:    0.295 m  AORTA Ao Root diam: 3.00 cm MITRAL VALVE MV Area (PHT): 3.46 cm     SHUNTS MV Area VTI:   1.51 cm     Systemic VTI:  0.30 m MV Peak grad:  21.9 mmHg    Systemic Diam: 1.80 cm MV Mean grad:  9.0 mmHg MV Vmax:       2.34 m/s MV Vmean:      139.0 cm/s MV Decel Time: 219 msec MV E velocity: 100.00 cm/s MV A velocity: 180.00 cm/s MV E/A ratio:  0.56 Manish Patwardhan MD Electronically signed by Vernell Leep MD  Signature Date/Time: 05/19/2022/12:10:53 PM    Final      LOS: 4 days   Signature  Lala Lund M.D on 05/20/2022 at 10:18 AM   -  To page go to [email or web address]

## 2022-05-20 NOTE — Progress Notes (Addendum)
[facility] Progress Note   Subjective:   Patient seen and examined in room.  Feeling better.  Admits to chills.  Denies CP, SOB, abdominal pain and n/v/d.  Discussed plan to have [place] replaced today followed by dialysis.   Objective Vitals:   05/19/22 1931 05/19/22 2308 05/20/22 0315 05/20/22 0801  BP: (!) 159/80 (!) 148/64 (!) 146/72 135/75  Pulse: 97 96  90  Resp: '16 17 18 17  '$ Temp: 98.7 F (37.1 C) 97.8 F (36.6 C) 97.8 F (36.6 C) 97.6 F (36.4 C)  TempSrc: Oral Oral Oral Oral  SpO2: 97% 96% 95% 97%  Weight:      Height:       Physical Exam General:WDWN female sitting in bedside chair in NAD Heart:RRR, no mrg Lungs:+scattered wheezing, nml WOB on RA, no rales Abdomen:soft, NTND Extremities:no LE edema Dialysis Access: none   Filed Weights   05/16/22 2222 05/17/22 1126 05/17/22 1520  Weight: 92.7 kg 89.7 kg 89.9 kg   No intake or output data in the 24 hours ending 05/20/22 1045  Additional Objective Labs: Basic Metabolic Panel: Recent Labs  Lab 05/18/22 0433 05/19/22 2007 05/20/22 0131  NA 135 131* 134*  K 5.0 4.2 4.6  CL 102 100 103  CO2 20* 18* 19*  GLUCOSE 100* 145* 122*  BUN 24* 36* 36*  CREATININE 5.49* 9.05* 9.12*  CALCIUM 8.4* 8.5* 8.7*  PHOS 2.9 3.8 3.9   Liver Function Tests: Recent Labs  Lab 05/16/22 1407 05/17/22 0047 05/18/22 0433 05/19/22 2007 05/20/22 0131  AST 21 25  --   --   --   ALT 16 14  --   --   --   ALKPHOS 109 89  --   --   --   BILITOT 0.6 0.8  --   --   --   PROT 7.4 6.6  --   --   --   ALBUMIN 2.5* 2.0* 2.0* 2.1* 2.0*    CBC: Recent Labs  Lab 05/16/22 1407 05/17/22 0047 05/18/22 0433 05/19/22 2007 05/20/22 0131  WBC 25.1* 22.0* 16.9* 14.7* 15.3*  NEUTROABS  --   --   --   --  11.4*  HGB 10.9* 10.8* 9.8* 9.5* 9.1*  HCT 33.4* 32.6* 31.3* 29.9* 29.9*  MCV 95.2 92.1 95.4 94.6 96.8  PLT 269 192 250 276 267   Blood Culture    Component Value Date/Time   SDES BLOOD LEFT HAND 05/18/2022 0445    SPECREQUEST  05/18/2022 0445    AEROBIC BOTTLE ONLY Blood Culture results may not be optimal due to an inadequate volume of blood received in culture bottles   CULT  05/18/2022 0445    NO GROWTH 2 DAYS Performed at [facility], [address], [place], [place] [address]    REPTSTATUS PENDING 05/18/2022 0445    Studies/Results: ECHOCARDIOGRAM COMPLETE  Result Date: 05/19/2022    ECHOCARDIOGRAM REPORT   Patient Name:   [facility] Date of Exam: 05/18/2022 Medical Rec #:  [id number]            Height:       62.0 in Accession #:    [id number]           Weight:       198.2 lb Date of Birth:  [birth year]             BSA:          1.904 m Patient Age:    66  years             BP:           109/71 mmHg Patient Gender: F                    HR:           95 bpm. Exam Location:  Inpatient Procedure: 2D Echo, Cardiac Doppler and Color Doppler Indications:     Endocarditis  History:         Patient has prior history of Echocardiogram examinations, most                  recent 07/17/2021. CHF, CAD, COPD; Risk Factors:Hypertension and                  Diabetes.  Sonographer:     Merrie Roof RDCS Referring Phys:  [place] Diagnosing Phys: Vernell Leep MD IMPRESSIONS  1. Left ventricular ejection fraction, by estimation, is 70 to 75%. The left ventricle has hyperdynamic function. The left ventricle has no regional wall motion abnormalities. There is moderate left ventricular hypertrophy. Left ventricular diastolic parameters are consistent with Grade I diastolic dysfunction (impaired relaxation).  2. Right ventricular systolic function is normal. The right ventricular size is normal.  3. No vegetation seen. The mitral valve is abnormal. No evidence of mitral valve regurgitation. No evidence of mitral stenosis. Moderate mitral annular calcification. No vegetation seen.  4. Increased transvalvular gradient (max 42 mmHg) likely a combination of LVOT obstruction and possibly mild aortic  valvular stenosis.     No vegetation seen.. The aortic valve is tricuspid. There is mild calcification of the aortic valve. Aortic valve regurgitation is not visualized. No vegetation seen  5. No signifcaitn change compared to outpatient study in 03/2022. FINDINGS  Left Ventricle: Left ventricular ejection fraction, by estimation, is 70 to 75%. The left ventricle has hyperdynamic function. The left ventricle has no regional wall motion abnormalities. The left ventricular internal cavity size was small. There is moderate left ventricular hypertrophy. Left ventricular diastolic parameters are consistent with Grade I diastolic dysfunction (impaired relaxation). Right Ventricle: The right ventricular size is normal. No increase in right ventricular wall thickness. Right ventricular systolic function is normal. Left Atrium: Left atrial size was normal in size. Right Atrium: Right atrial size was not assessed. Pericardium: There is no evidence of pericardial effusion. Mitral Valve: No vegetation seen. The mitral valve is abnormal. Moderate mitral annular calcification. No evidence of mitral valve regurgitation. No evidence of mitral valve stenosis. MV peak gradient, 21.9 mmHg. The mean mitral valve gradient is 9.0 mmHg. Tricuspid Valve: No vegetation seen. The tricuspid valve is normal in structure. Tricuspid valve regurgitation is not demonstrated. No evidence of tricuspid stenosis. Aortic Valve: Increased transvalvular gradient (max 42 mmHg) likely a combination of LVOT obstruction and possibly mild aortic valvular stenosis. No vegetation seen. The aortic valve is tricuspid. There is mild calcification of the aortic valve. Aortic valve regurgitation is not visualized. Pulmonic Valve: The pulmonic valve was grossly normal. Pulmonic valve regurgitation is mild. Aorta: The aortic root is normal in size and structure. Pulmonary Artery: No vegetation seen. IAS/Shunts: The interatrial septum was not assessed.  LEFT VENTRICLE  PLAX 2D LVIDd:         3.50 cm   Diastology LVIDs:         2.10 cm   LV e' medial:    6.53 cm/s LV PW:  1.50 cm   LV E/e' medial:  15.3 LV IVS:        1.30 cm   LV e' lateral:   8.27 cm/s LVOT diam:     1.80 cm   LV E/e' lateral: 12.1 LV SV:         75 LV SV Index:   39 LVOT Area:     2.54 cm  RIGHT VENTRICLE RV S prime:     13.40 cm/s TAPSE (M-mode): 1.9 cm LEFT ATRIUM             Index LA diam:        3.90 cm 2.05 cm/m LA Vol (A2C):   49.0 ml 25.73 ml/m LA Vol (A4C):   62.8 ml 32.98 ml/m LA Biplane Vol: 60.5 ml 31.77 ml/m  AORTIC VALVE LVOT Vmax:   142.00 cm/s LVOT Vmean:  106.000 cm/s LVOT VTI:    0.295 m  AORTA Ao Root diam: 3.00 cm MITRAL VALVE MV Area (PHT): 3.46 cm     SHUNTS MV Area VTI:   1.51 cm     Systemic VTI:  0.30 m MV Peak grad:  21.9 mmHg    Systemic Diam: 1.80 cm MV Mean grad:  9.0 mmHg MV Vmax:       2.34 m/s MV Vmean:      139.0 cm/s MV Decel Time: 219 msec MV E velocity: 100.00 cm/s MV A velocity: 180.00 cm/s MV E/A ratio:  0.56 Manish Patwardhan MD Electronically signed by Vernell Leep MD Signature Date/Time: 05/19/2022/12:10:53 PM    Final     Medications:   ceFAZolin (ANCEF) IV 1 g (05/19/22 1746)    ceFAZolin (ANCEF) IV      apixaban  2.5 mg Oral BID   aspirin  81 mg Oral Daily   atorvastatin  10 mg Oral QPM   Chlorhexidine Gluconate Cloth  6 each Topical Q0600   colchicine  0.6 mg Oral Daily   diclofenac Sodium  2 g Topical QID   ferric citrate  840 mg Oral TID WC   gabapentin  300 mg Oral Daily   midodrine  5 mg Oral Q M,W,F-HD   pantoprazole  40 mg Oral Daily    Dialysis Orders: [facility] M[facility]  3.5h  92kg    400/1.5  2/2.5 bath  [facility] TDC  Hep 6000 - last HD 5/17 93.8 > 91.6 kg - last Hb 10.6 on 5/17 - hectorol 5 mcg IV tiw   Assessment/Plan: MSSA bacteremia - w/ fall, gen'd weakness, chills. Has TDC in chest, suspected HD cath infection.  IR removed TDC for line holiday after HD Friday, plan for new TDC to be placed today. Repeat culture w/NGTD.   Getting IV cefazolin.  ESRD - on HD MWF.  HD today after TDC placed.  BP/vol - BP in goal.  Appears euvolemic on exam and is below her prior EDW.  UF as tolerated.  Likely need EDW lowered on d/c.  Will requesting standing weight.  L hip pain - Seen by ortho & IR.  Not enough fluid to obtain a sample to r/o septic arthritis but believe it is unlikely with nml ROM. Possible bursitis? Per PMD/orhto Anemia esrd - Hb 9.1.  Order Aranesp 21mg with HD today.  MBD ckd - Calcium and phosphorus controlled. Continue binders and hectorol.  Hyperkalemia - Resolved post HD. K+ tends to run high outpatient, discussed importance of strict low K. K 4.6 today.  Nutrition - Renal diet w/fluid restrictions.  Recent NSTEMI -  No CP.   Addendum 67:12 - Complications trying to place [facility] per IR due to thrombosis of IJ.  Plan to retry tomorrow after starting anticoagulation and contrast allergy prophylaxis.  Will plan for HD to follow.      Jen Mow, PA-C [facility] 05/20/2022,10:45 AM  LOS: 4 days

## 2022-05-20 NOTE — Telephone Encounter (Signed)
Scheduled follow-up appointment per 5/18 los. Patient is aware. 

## 2022-05-21 ENCOUNTER — Inpatient Hospital Stay (HOSPITAL_COMMUNITY): Payer: Medicare Other

## 2022-05-21 DIAGNOSIS — W19XXXA Unspecified fall, initial encounter: Secondary | ICD-10-CM | POA: Diagnosis not present

## 2022-05-21 DIAGNOSIS — I959 Hypotension, unspecified: Secondary | ICD-10-CM

## 2022-05-21 HISTORY — PX: IR PERC TUN PERIT CATH WO PORT S&I /IMAG: IMG2327

## 2022-05-21 HISTORY — PX: IR PTA VENOUS EXCEPT DIALYSIS CIRCUIT: IMG6126

## 2022-05-21 HISTORY — PX: IR FLUORO GUIDE CV LINE LEFT: IMG2282

## 2022-05-21 HISTORY — PX: IR VENOCAVAGRAM SVC: IMG679

## 2022-05-21 HISTORY — PX: IR US GUIDE VASC ACCESS LEFT: IMG2389

## 2022-05-21 LAB — CBC WITH DIFFERENTIAL/PLATELET
Abs Immature Granulocytes: 0.1 10*3/uL — ABNORMAL HIGH (ref 0.00–0.07)
Basophils Absolute: 0 10*3/uL (ref 0.0–0.1)
Basophils Relative: 0 %
Eosinophils Absolute: 0.4 10*3/uL (ref 0.0–0.5)
Eosinophils Relative: 3 %
HCT: 30.7 % — ABNORMAL LOW (ref 36.0–46.0)
Hemoglobin: 9.4 g/dL — ABNORMAL LOW (ref 12.0–15.0)
Immature Granulocytes: 1 %
Lymphocytes Relative: 10 %
Lymphs Abs: 1.4 10*3/uL (ref 0.7–4.0)
MCH: 29.4 pg (ref 26.0–34.0)
MCHC: 30.6 g/dL (ref 30.0–36.0)
MCV: 95.9 fL (ref 80.0–100.0)
Monocytes Absolute: 0.7 10*3/uL (ref 0.1–1.0)
Monocytes Relative: 6 %
Neutro Abs: 10.4 10*3/uL — ABNORMAL HIGH (ref 1.7–7.7)
Neutrophils Relative %: 80 %
Platelets: 286 10*3/uL (ref 150–400)
RBC: 3.2 MIL/uL — ABNORMAL LOW (ref 3.87–5.11)
RDW: 14.9 % (ref 11.5–15.5)
WBC: 13 10*3/uL — ABNORMAL HIGH (ref 4.0–10.5)
nRBC: 0 % (ref 0.0–0.2)

## 2022-05-21 LAB — HEPARIN LEVEL (UNFRACTIONATED): Heparin Unfractionated: 0.89 IU/mL — ABNORMAL HIGH (ref 0.30–0.70)

## 2022-05-21 LAB — APTT
aPTT: 40 seconds — ABNORMAL HIGH (ref 24–36)
aPTT: 39 seconds — ABNORMAL HIGH (ref 24–36)

## 2022-05-21 LAB — HEPATITIS B SURFACE ANTIBODY, QUANTITATIVE: Hep B S AB Quant (Post): <3.1 m[IU]/mL — ABNORMAL LOW (ref >9.9)

## 2022-05-21 IMAGING — XA IR TUNNELED CV CATH W/O PORT/PUMP >5YO
6 series · 13 of 13 positions shown · IV contrast (IODINE)
Comparison: none

INDICATION: End-stage renal disease, recent line infections, no current access,
limited venous access options

[Series 1: body 4 care · 4 of 10 frames shown (1 of 2)]
[frame 2/10]
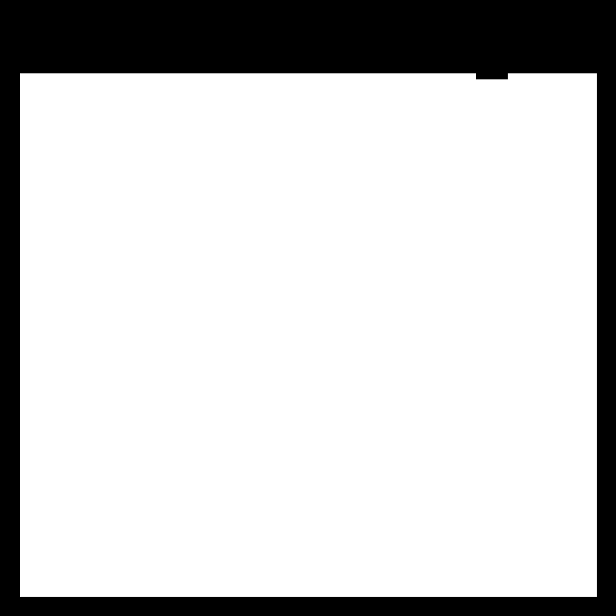
[frame 6/10]
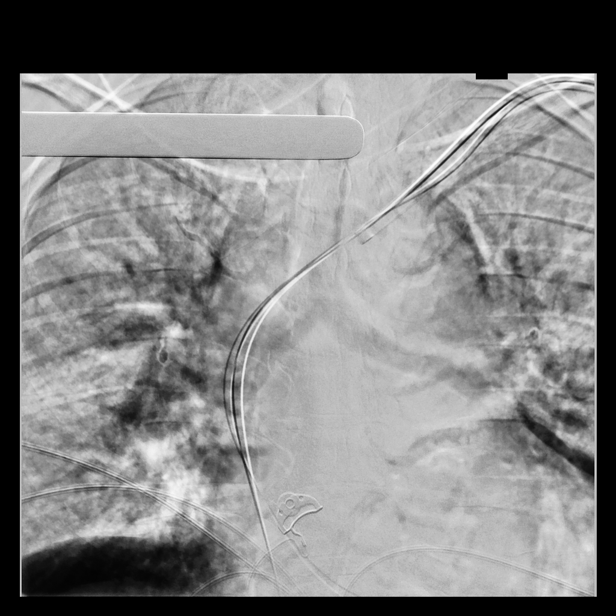
[frame 9/10]
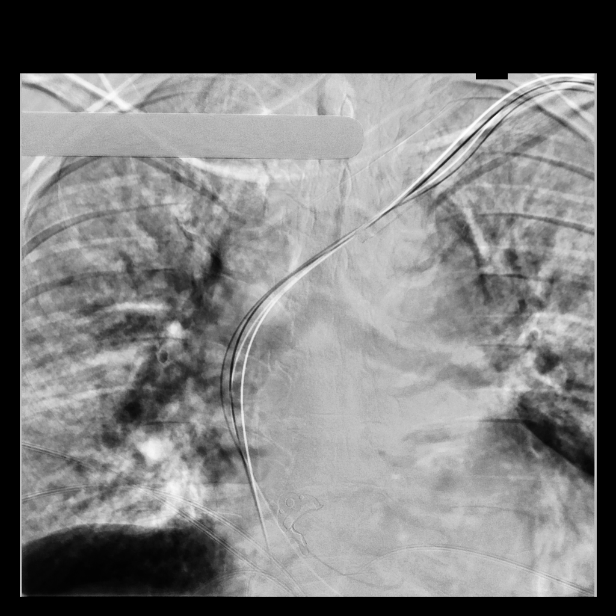
[frame 10/10]
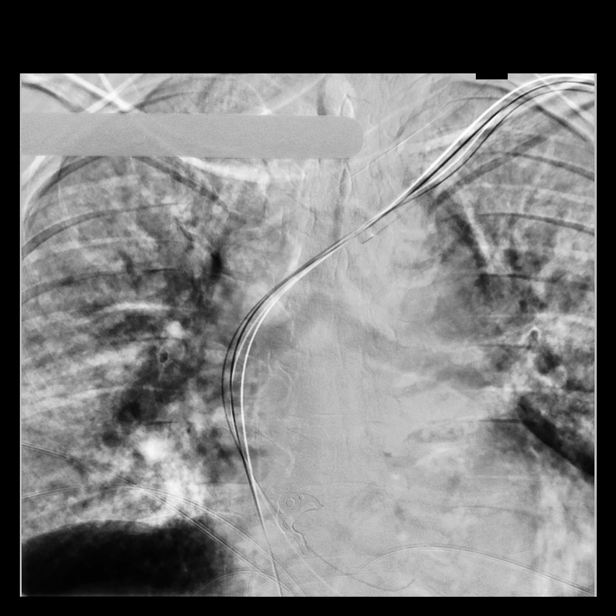

[Series 1: ir tunneled cv cath w/o port/pump >5yo · 3 of 3 slices shown]
[im 1/3]
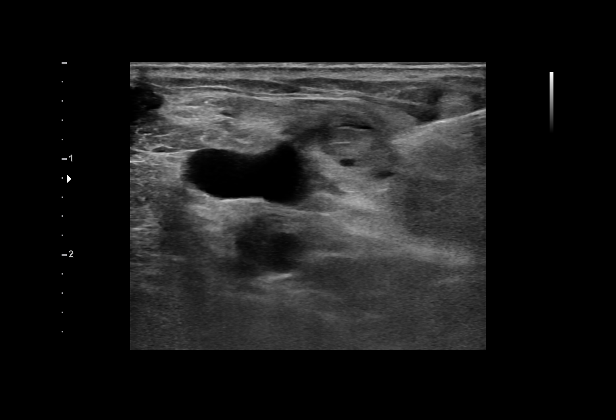
[im 2/3]
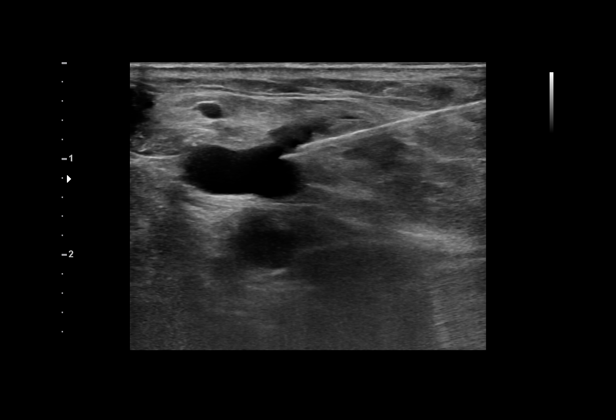
[im 3/3]
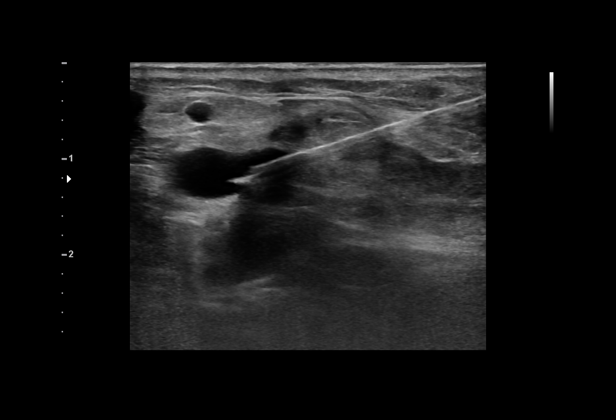

[Series 2: body 4 care · 3 of 14 frames shown (2 of 2)]
[frame 3/14]
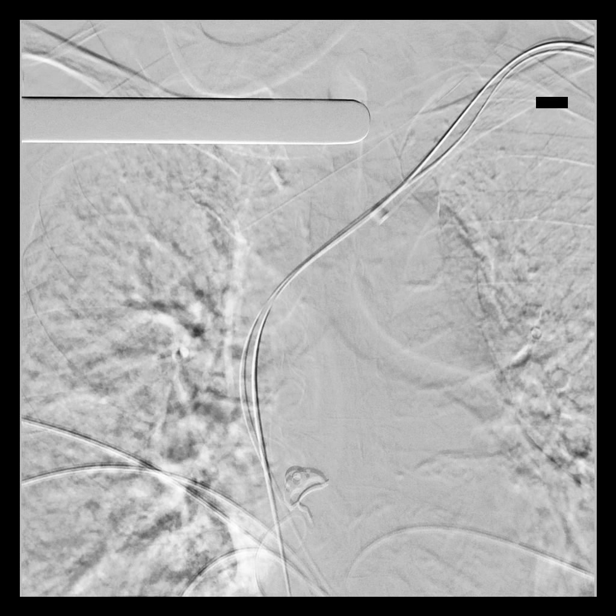
[frame 8/14]
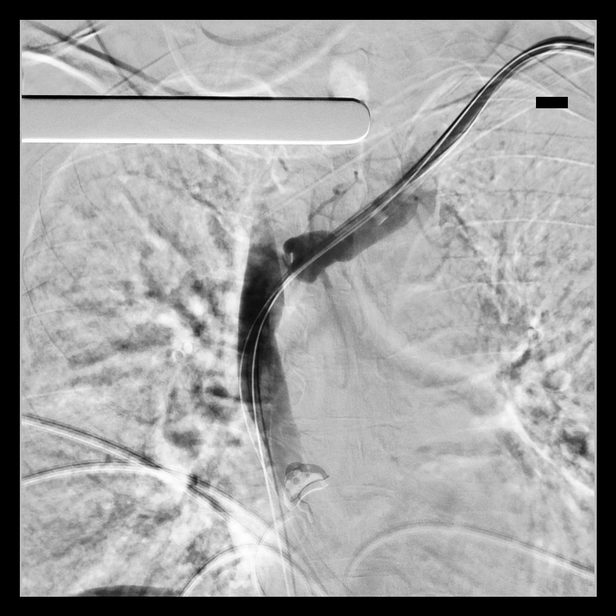
[frame 12/14]
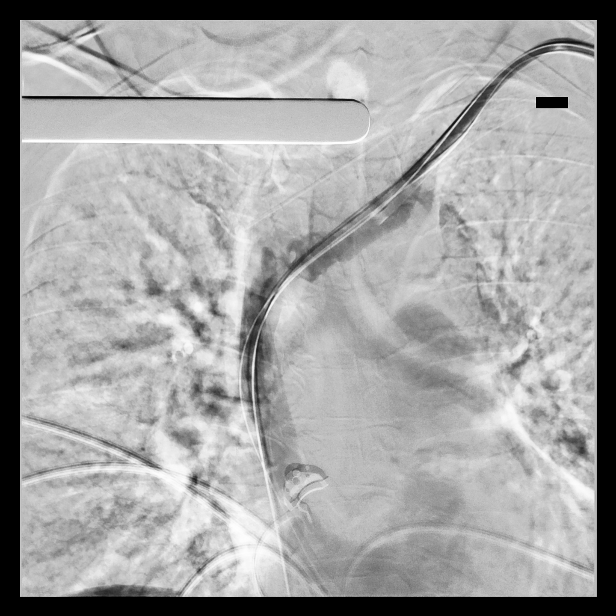

[Series 3: fl (-) angio · 1 of 1 slices shown (1 of 3)]
[im 1/1]
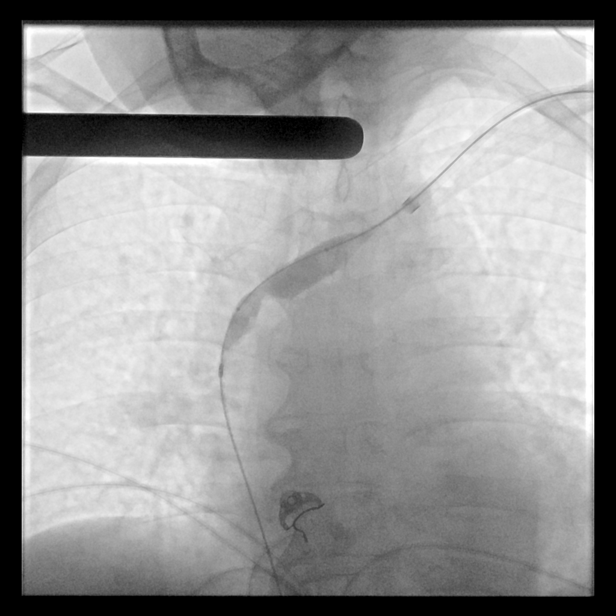

[Series 4: fl (-) angio · 1 of 1 slices shown (2 of 3)]
[im 1/1]
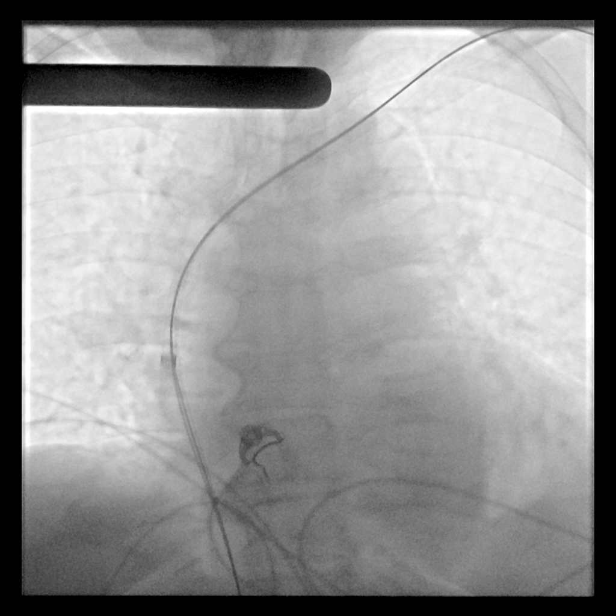

[Series 5: fl (-) angio · 1 of 1 slices shown (3 of 3)]
[im 1/1]
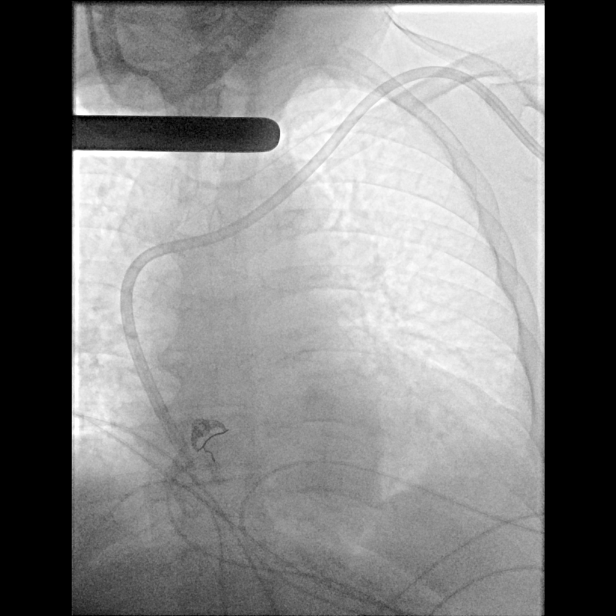

[13 of 13 positions shown; findings below may reference images not displayed]

EXAM:
ULTRASOUND GUIDANCE FOR VASCULAR ACCESS

MABY VENOUS ACCESS AND PLACEMENT OF A TUNNELED DIALYSIS CATHETER.

CENTRAL SVC VENOGRAM WITH 12 MM CENTRAL INNOMINATE VENOUS
ANGIOPLASTY

MEDICATIONS:
Ancef 2 g; The antibiotic was administered within an appropriate
time interval prior to skin puncture.

ANESTHESIA/SEDATION:
Moderate (conscious) sedation was employed during this procedure. A
total of Versed 2.0 mg and Fentanyl 100 mcg was administered
intravenously by the radiology nurse.

Total intra-service moderate Sedation Time: 46 minutes. The
patient's level of consciousness and vital signs were monitored
continuously by radiology nursing throughout the procedure under my
direct supervision.

FLUOROSCOPY:
Radiation Exposure Index (as provided by the fluoroscopic device):
117 mGy Kerma

COMPLICATIONS:
None immediate.

PROCEDURE:
Informed written consent was obtained from the patient after a
thorough discussion of the procedural risks, benefits and
alternatives. All questions were addressed. Maximal Sterile Barrier
Technique was utilized including caps, mask, sterile gowns, sterile
gloves, sterile drape, hand hygiene and skin antiseptic. A timeout
was performed prior to the initiation of the procedure.

Under sterile conditions and local anesthesia, percutaneous
micropuncture needle access performed of a dilated left external
jugular vein in the mid supraclavicular region. 018 guidewire
advanced centrally to the innominate venous confluence. Micro
dilator set advanced. Guidewire access advanced into the SVC with
some difficulty through the innominate SVC junction. Catheter
advanced followed by placement of an Amplatz guidewire into the IVC.
Measurements obtained for the appropriate length. Subcutaneous
tunnel was created in the left infraclavicular chest. The 28 cm
palindrome catheter was tunneled subcutaneously to the venotomy site
and attempts were made to advance the catheter into the SVC through
the valve peel-away sheath. Catheter would not advanced through the
innominate SVC junction suggesting central venous stenosis.

Catheter eventually had to be removed with insertion of a 12 French
sheath. Through the sheath, central venogram performed over Amplatz
guidewires. This confirms a stenosis of the left innominate vein
centrally at the innominate venous confluence. 12 mm prolonged
angioplasty performed of the central left innominate vein stenosis
with a 12 x 4 atlas balloon. Single inflation for 1 minute at 18
atmospheres. Balloon was deflated and removed. Over double stiff
Amplatz guidewire access, eventually the 28 cm palindrome catheter
was advanced into the SVC RA junction. Position confirmed with
fluoroscopy. Images obtained for documentation. Blood aspirated
easily followed by saline and heparin flushes. Appropriate volume
strength of heparin instilled in both lumens followed by external
caps. Catheter secured with Prolene suture. Venotomy site closed
with subcuticular Vicryl suture and Dermabond. Sterile dressing
applied. No immediate complication. Patient tolerated the procedure
well.
IMPRESSION: Successful left external jugular tunnel HD catheter requiring 12 mm
venous angioplasty of the central left innominate vein as detailed
above to allow catheter placement.

Tip SVC RA junction.  Ready for use.

Please note it will be critical to maintain this catheter access as
the patient has limited upper extremity central venous options and
currently refuses use of the femoral veins for tunneled catheter
access.

## 2022-05-21 MED ORDER — GELATIN ABSORBABLE 12-7 MM EX MISC
CUTANEOUS | Status: AC | PRN
  Administered 2022-05-21: 1 via TOPICAL

## 2022-05-21 MED ORDER — KETOROLAC TROMETHAMINE 30 MG/ML IJ SOLN
30.0000 mg | Freq: Once | INTRAMUSCULAR | Status: AC
  Administered 2022-05-21: 30 mg via INTRAVENOUS
  Filled 2022-05-21: qty 1

## 2022-05-21 MED ORDER — LIDOCAINE-EPINEPHRINE 1 %-1:100000 IJ SOLN
INTRAMUSCULAR | Status: AC | PRN
  Administered 2022-05-21: 15 mL via INTRADERMAL

## 2022-05-21 MED ORDER — FENTANYL CITRATE (PF) 100 MCG/2ML IJ SOLN
INTRAMUSCULAR | Status: AC
  Filled 2022-05-21: qty 2

## 2022-05-21 MED ORDER — HEPARIN SODIUM (PORCINE) 1000 UNIT/ML IJ SOLN
INTRAMUSCULAR | Status: AC | PRN
  Administered 2022-05-21: 4200 [IU] via INTRAVENOUS

## 2022-05-21 MED ORDER — HEPARIN SODIUM (PORCINE) 1000 UNIT/ML IJ SOLN
INTRAMUSCULAR | Status: AC
  Filled 2022-05-21: qty 10

## 2022-05-21 MED ORDER — CEFAZOLIN SODIUM-DEXTROSE 2-4 GM/100ML-% IV SOLN
INTRAVENOUS | Status: AC | PRN
  Administered 2022-05-21: 2 g via INTRAVENOUS

## 2022-05-21 MED ORDER — MIDAZOLAM HCL 2 MG/2ML IJ SOLN
INTRAMUSCULAR | Status: AC
Start: 2022-05-21 — End: 2022-05-22
  Filled 2022-05-21: qty 4

## 2022-05-21 MED ORDER — LIDOCAINE-EPINEPHRINE 1 %-1:100000 IJ SOLN
INTRAMUSCULAR | Status: AC
  Filled 2022-05-21: qty 1

## 2022-05-21 MED ORDER — CEFAZOLIN SODIUM-DEXTROSE 2-4 GM/100ML-% IV SOLN
INTRAVENOUS | Status: AC
  Filled 2022-05-21: qty 100

## 2022-05-21 MED ORDER — GELATIN ABSORBABLE 12-7 MM EX MISC
CUTANEOUS | Status: AC
  Filled 2022-05-21: qty 1

## 2022-05-21 MED ORDER — MIDAZOLAM HCL 2 MG/2ML IJ SOLN
INTRAMUSCULAR | Status: AC | PRN
  Administered 2022-05-21: 1 mg via INTRAVENOUS
  Administered 2022-05-21 (×2): .5 mg via INTRAVENOUS

## 2022-05-21 MED ORDER — IOHEXOL 300 MG/ML  SOLN
100.0000 mL | Freq: Once | INTRAMUSCULAR | Status: AC | PRN
  Administered 2022-05-21: 15 mL via INTRAVENOUS

## 2022-05-21 MED ORDER — FENTANYL CITRATE (PF) 100 MCG/2ML IJ SOLN
INTRAMUSCULAR | Status: AC | PRN
  Administered 2022-05-21 (×4): 25 ug via INTRAVENOUS

## 2022-05-21 NOTE — Procedures (Signed)
Interventional Radiology Procedure Note  Procedure: LT EJ TUNNELED HD CATH 12MM CENTRAL INNOMINATE VENOUS PTA    Complications: None  Estimated Blood Loss:  50CC  Findings: TIP SVCRA  PLEASE NOTE ITS CRITICAL TO MAINTAIN THIS ACCESS.  VERY LIMITED VENOUS ACCESS OPTIONS FOR THIS PATIENT WHO STATES SHE WILL NOT HAVE FEMORAL CATHETER    M. Daryll Brod, MD

## 2022-05-21 NOTE — Progress Notes (Signed)
ANTICOAGULATION CONSULT NOTE  Pharmacy Consult for Heparin Indication: DVT Brief A/P: aPTT subtherapeutic  Increase Heparin rate  Allergies  Allergen Reactions   Carnosine     Other reaction(s): Unknown   Gadolinium Derivatives Hives and Other (See Comments)    HIVES, Desc: HIVES W/ "DYE" USED FOR 1ST CT SCAN BUT NOT 2ND, NO PREMEDS USED, PT UNCERTAIN OF CIRCUMSTANCES,,?POSSIBLE MRI CONTRAST ALLERGY, ALL STUDIES DONE "SOMEWHERE" IN [place]//A.C., Onset Date: [id number]   Iohexol Hives and Other (See Comments)    Desc: HIVES W/ "DYE" USED FOR 1ST CT SCAN BUT NOT 2ND, NO PREMEDS USED, PT UNCERTAIN OF CIRCUMSTANCES,,?POSSIBLE MRI CONTRAST ALLERGY, ALL STUDIES DONE "SOMEWHERE" IN [place]//A.C., Onset Date: [id number]    Iodinated Contrast Media Hives   Iodine Hives   Naltrexone Other (See Comments)    Unknown    Patient Measurements: Height: '5\' 2"'$  (157.5 cm) Weight: 89.9 kg (198 lb 3.1 oz) IBW/kg (Calculated) : 50.1 Heparin Dosing Weight: 72 kg  Vital Signs: Temp: 97.8 F (36.6 C) (05/22 2307) Temp Source: Oral (05/22 2307) BP: 118/70 (05/22 2307) Pulse Rate: 84 (05/22 2307)  Labs: Recent Labs    05/18/22 0433 05/19/22 2007 05/20/22 0131 05/21/22 0100  HGB 9.8* 9.5* 9.1* 9.4*  HCT 31.3* 29.9* 29.9* 30.7*  PLT 250 276 267 286  APTT  --   --   --  40*  HEPARINUNFRC  --   --   --  0.89*  CREATININE 5.49* 9.05* 9.12*  --      Estimated Creatinine Clearance: 6.3 mL/min (A) (by C-G formula based on SCr of 9.12 mg/dL (H)).  Assessment: 66 y.o. female with h/o DVT, Eliquis on hold, for heparin.  RN reported IV access lost ~ 30 min prior to level  Goal of Therapy:  Heparin level 0.3-0.7 units/ml; aPTT 66-102 sec Monitor platelets by anticoagulation protocol: Yes   Plan:  Increase Heparin 1200 units/hr Check aPTT in 8 hours    Phillis Knack, PharmD, BCPS  05/21/2022,1:58 AM

## 2022-05-21 NOTE — Progress Notes (Addendum)
Pt has been ordered a renal diet by physician, but has requested a regular diet. Pt educated on the importance of adhering to diet orders. Candiss Norse, MD notified of request and MD ordered for diet to be changed to regular with 1268m fluid restrictions at this time.

## 2022-05-21 NOTE — Progress Notes (Signed)
[facility] Progress Note   Subjective:   Patient seen and examined at bedside.  Plan for [facility] to be replaced today with HD to follow.  Complaints about renal diet, discussed need for low K diet especially with her currently not having an access.  Denies CP, SOB, abdominal pain and n/v/d.  Hip pain improved with Toradol.  Had 1/2 cup coffee this AM around 8 prior to being informed she needed to be NPO for the procedure today.   Objective Vitals:   05/20/22 2307 05/21/22 0315 05/21/22 0759 05/21/22 1059  BP: 118/70 (!) 161/97 (!) 134/33 (!) 164/93  Pulse: 84 92 (!) 105 92  Resp: 16 20 (!) 22 16  Temp: 97.8 F (36.6 C) 97.9 F (36.6 C) 97.8 F (36.6 C) 98.5 F (36.9 C)  TempSrc: Oral Oral Axillary Oral  SpO2: 93% 92% 94% 96%  Weight:      Height:       Physical Exam General:chronically ill appearing female in NAD Heart:RRR, no mrg Lungs:mostly CTAB, nml WOB on RA Abdomen:soft, NTND Extremities:no LE edema Dialysis Access: none   Filed Weights   05/16/22 2222 05/17/22 1126 05/17/22 1520  Weight: 92.7 kg 89.7 kg 89.9 kg    Intake/Output Summary (Last 24 hours) at 05/21/2022 1132 Last data filed at 05/21/2022 0700 Gross per 24 hour  Intake 961.3 ml  Output --  Net 961.3 ml    Additional Objective Labs: Basic Metabolic Panel: Recent Labs  Lab 05/18/22 0433 05/19/22 2007 05/20/22 0131  NA 135 131* 134*  K 5.0 4.2 4.6  CL 102 100 103  CO2 20* 18* 19*  GLUCOSE 100* 145* 122*  BUN 24* 36* 36*  CREATININE 5.49* 9.05* 9.12*  CALCIUM 8.4* 8.5* 8.7*  PHOS 2.9 3.8 3.9   Liver Function Tests: Recent Labs  Lab 05/16/22 1407 05/17/22 0047 05/18/22 0433 05/19/22 2007 05/20/22 0131  AST 21 25  --   --   --   ALT 16 14  --   --   --   ALKPHOS 109 89  --   --   --   BILITOT 0.6 0.8  --   --   --   PROT 7.4 6.6  --   --   --   ALBUMIN 2.5* 2.0* 2.0* 2.1* 2.0*   CBC: Recent Labs  Lab 05/17/22 0047 05/18/22 0433 05/19/22 2007 05/20/22 0131  05/21/22 0100  WBC 22.0* 16.9* 14.7* 15.3* 13.0*  NEUTROABS  --   --   --  11.4* 10.4*  HGB 10.8* 9.8* 9.5* 9.1* 9.4*  HCT 32.6* 31.3* 29.9* 29.9* 30.7*  MCV 92.1 95.4 94.6 96.8 95.9  PLT 192 250 276 267 286   CBG: Recent Labs  Lab 05/16/22 1410  GLUCAP 110*    Studies/Results: IR Fluoro Guide CV Line Right  Result Date: 05/20/2022 INDICATION: 66 year old woman with history of renal failure and multiple prior dialysis catheter placement and removal, most recently on 05/17/2022, presents to IR for tunneled IJ catheter placement. EXAM: 1. Ultrasound evaluation of bilateral internal jugular veins. 2. Attempted ultrasound-guided access of right internal and external jugular veins. MEDICATIONS: Ancef 2 g IV; The antibiotic was administered within an appropriate time interval prior to skin puncture. ANESTHESIA/SEDATION: Moderate (conscious) sedation was employed during this procedure. A total of Versed 1 mg and Fentanyl 50 mcg was administered intravenously by the radiology nurse. Total intra-service moderate Sedation Time: 16 minutes. The patient's level of consciousness and vital signs were monitored continuously by radiology  nursing throughout the procedure under my direct supervision. FLUOROSCOPY: Radiation Exposure Index (as provided by the fluoroscopic device): 7 mGy Kerma COMPLICATIONS: None immediate. PROCEDURE: Informed written consent was obtained from the patient after a thorough discussion of the procedural risks, benefits and alternatives. All questions were addressed. Maximal Sterile Barrier Technique was utilized including caps, mask, sterile gowns, sterile gloves, sterile drape, hand hygiene and skin antiseptic. A timeout was performed prior to the initiation of the procedure. Patient positioned supine on the procedure table. Ultrasound evaluation of the left neck demonstrates thrombosed left internal jugular vein. Ultrasound evaluation of the right neck demonstrates partial thrombosis  of the right internal jugular vein. The external jugular vein was patent. Sterile ultrasound probe cover and gel utilized throughout the procedure. Using continuous ultrasound guidance, the right internal jugular vein was accessed with a 21 gauge needle. However guidewire could not be advanced centrally beyond the clavicle. Using continuous ultrasound guidance, the right external jugular vein was accessed with a 21 gauge needle. However guidewire could not be advanced centrally beyond the clavicle. Venogram could not be performed due to patient's dye allergy. Right neck accesses were removed and hemostasis achieved with manual compression. IMPRESSION: 1. Thrombosed left internal jugular vein. 2. Partially thrombosed right internal jugular vein. 3. Both right internal and external jugular veins were accessed with 21 gauge needles, however guidewire could not be advanced beyond the clavicle. 4. Venogram could not be performed due to patient's dye allergy. PLAN: 1. IV dye allergy prep will be ordered. 2. Patient will be brought back tomorrow to once again attempt tunneled dialysis catheter placement and venogram if necessary. Electronically Signed   By: Miachel Roux M.D.   On: 05/20/2022 17:04   IR US Guide Vasc Access Right  Result Date: 05/20/2022 INDICATION: 66 year old woman with history of renal failure and multiple prior dialysis catheter placement and removal, most recently on 05/17/2022, presents to IR for tunneled IJ catheter placement. EXAM: 1. Ultrasound evaluation of bilateral internal jugular veins. 2. Attempted ultrasound-guided access of right internal and external jugular veins. MEDICATIONS: Ancef 2 g IV; The antibiotic was administered within an appropriate time interval prior to skin puncture. ANESTHESIA/SEDATION: Moderate (conscious) sedation was employed during this procedure. A total of Versed 1 mg and Fentanyl 50 mcg was administered intravenously by the radiology nurse. Total intra-service  moderate Sedation Time: 16 minutes. The patient's level of consciousness and vital signs were monitored continuously by radiology nursing throughout the procedure under my direct supervision. FLUOROSCOPY: Radiation Exposure Index (as provided by the fluoroscopic device): 7 mGy Kerma COMPLICATIONS: None immediate. PROCEDURE: Informed written consent was obtained from the patient after a thorough discussion of the procedural risks, benefits and alternatives. All questions were addressed. Maximal Sterile Barrier Technique was utilized including caps, mask, sterile gowns, sterile gloves, sterile drape, hand hygiene and skin antiseptic. A timeout was performed prior to the initiation of the procedure. Patient positioned supine on the procedure table. Ultrasound evaluation of the left neck demonstrates thrombosed left internal jugular vein. Ultrasound evaluation of the right neck demonstrates partial thrombosis of the right internal jugular vein. The external jugular vein was patent. Sterile ultrasound probe cover and gel utilized throughout the procedure. Using continuous ultrasound guidance, the right internal jugular vein was accessed with a 21 gauge needle. However guidewire could not be advanced centrally beyond the clavicle. Using continuous ultrasound guidance, the right external jugular vein was accessed with a 21 gauge needle. However guidewire could not be advanced centrally beyond the clavicle. Venogram  could not be performed due to patient's dye allergy. Right neck accesses were removed and hemostasis achieved with manual compression. IMPRESSION: 1. Thrombosed left internal jugular vein. 2. Partially thrombosed right internal jugular vein. 3. Both right internal and external jugular veins were accessed with 21 gauge needles, however guidewire could not be advanced beyond the clavicle. 4. Venogram could not be performed due to patient's dye allergy. PLAN: 1. IV dye allergy prep will be ordered. 2. Patient will  be brought back tomorrow to once again attempt tunneled dialysis catheter placement and venogram if necessary. Electronically Signed   By: Miachel Roux M.D.   On: 05/20/2022 17:04    Medications:   ceFAZolin (ANCEF) IV 1 g (05/20/22 1642)   heparin 1,400 Units/hr (05/21/22 1052)    aspirin  81 mg Oral Daily   atorvastatin  10 mg Oral QPM   Chlorhexidine Gluconate Cloth  6 each Topical Q0600   colchicine  0.6 mg Oral Daily   darbepoetin (ARANESP) injection - DIALYSIS  60 mcg Intravenous Q Mon-HD   diclofenac Sodium  2 g Topical QID   diphenhydrAMINE  50 mg Oral Once   ferric citrate  840 mg Oral TID WC   gabapentin  300 mg Oral Daily   midodrine  5 mg Oral Q M,W,F-HD   pantoprazole  40 mg Oral Daily   predniSONE  50 mg Oral Q6H    Dialysis Orders: [facility] M[facility]  3.5h  92kg    400/1.5  2/2.5 bath  [facility] TDC  Hep 6000 - last HD 5/17 93.8 > 91.6 kg - last Hb 10.6 on 5/17 - hectorol 5 mcg IV tiw   Assessment/Plan: MSSA bacteremia - w/ fall, gen'd weakness, chills. Repeat culture w/NGTD.  Getting IV cefazolin 2g qHD until 06/29/22. Has TDC in chest, suspected HD cath infection.  IR removed TDC for line holiday after HD Friday 5/19. Attempted to replace [facility] yesterday but unable to be completed d/t patient having thrombosis in b/l IJ.  Started on heparin and contrast prophylaxis d/t contrast allergy with plan to attempt to replaced [facility] again today with possible venogram.  ESRD - on HD MWF.  HD today after [facility] placed and again tomorrow to get back on schedule.  BP/vol - BP variable. Appears euvolemic on exam and is below her prior EDW.  UF as tolerated.  Likely need EDW lowered on d/c.  Will requesting standing weight if able.  L hip pain - Seen by ortho & IR.  Not enough fluid to obtain a sample to r/o septic arthritis but believe it is unlikely with nml ROM. Possible bursitis? Per PMD/ortho Anemia esrd - Hb 9.4.  Order Aranesp 70mg with HD tomorrow.  MBD ckd - Calcium and phosphorus controlled.  Continue binders and hectorol.  Hyperkalemia - Resolved post HD. K+ tends to run high outpatient, discussed importance of strict low K. K 4.6 today.  Nutrition - Renal diet w/fluid restrictions.  Recent NSTEMI - No CP.   LJen Mow PA-C [facility] 05/21/2022,11:32 AM  LOS: 5 days

## 2022-05-21 NOTE — Progress Notes (Signed)
ANTICOAGULATION CONSULT NOTE  Pharmacy Consult for Heparin Indication: DVT Brief A/P: aPTT subtherapeutic  Increase Heparin rate  Allergies  Allergen Reactions   Carnosine     Other reaction(s): Unknown   Gadolinium Derivatives Hives and Other (See Comments)    HIVES, Desc: HIVES W/ "DYE" USED FOR 1ST CT SCAN BUT NOT 2ND, NO PREMEDS USED, PT UNCERTAIN OF CIRCUMSTANCES,,?POSSIBLE MRI CONTRAST ALLERGY, ALL STUDIES DONE "SOMEWHERE" IN [place]//A.C[facility] Onset Date: [id number]   Iohexol Hives and Other (See Comments)    Desc: HIVES W/ "DYE" USED FOR 1ST CT SCAN BUT NOT 2ND, NO PREMEDS USED, PT UNCERTAIN OF CIRCUMSTANCES,,?POSSIBLE MRI CONTRAST ALLERGY, ALL STUDIES DONE "SOMEWHERE" IN [place]//A.C., Onset Date: [id number]    Iodinated Contrast Media Hives   Iodine Hives   Naltrexone Other (See Comments)    Unknown    Patient Measurements: Height: '5\' 2"'$  (157.5 cm) Weight: 89.9 kg (198 lb 3.1 oz) IBW/kg (Calculated) : 50.1 Heparin Dosing Weight: 72 kg  Vital Signs: Temp: 97.8 F (36.6 C) (05/23 0759) Temp Source: Axillary (05/23 0759) BP: 134/33 (05/23 0759) Pulse Rate: 105 (05/23 0759)  Labs: Recent Labs    05/19/22 2007 05/20/22 0131 05/21/22 0100 05/21/22 0909  HGB 9.5* 9.1* 9.4*  --   HCT 29.9* 29.9* 30.7*  --   PLT 276 267 286  --   APTT  --   --  40* 39*  HEPARINUNFRC  --   --  0.89*  --   CREATININE 9.05* 9.12*  --   --      Estimated Creatinine Clearance: 6.3 mL/min (A) (by C-G formula based on SCr of 9.12 mg/dL (H)).  Assessment: 66 y.o. female with h/o DVT, Eliquis on hold, for heparin.    Goal of Therapy:  Heparin level 0.3-0.7 units/ml; aPTT 66-102 sec Monitor platelets by anticoagulation protocol: Yes   Plan:  Increase Heparin 1400 units/hr Check aPTT in 8 hours if not to radiology  Thank you Anette Guarneri, PharmD 05/21/2022,10:47 AM

## 2022-05-21 NOTE — Progress Notes (Signed)
PROGRESS NOTE        PATIENT DETAILS Name: Whitney Walker Age: 66 y.o. Sex: female Date of Birth: [birth year] Admit Date: 05/16/2022 Admitting Physician Donne Hazel, MD LSL:HTDSK, Doreene Burke, FNP  Brief Summary: Patient is a 66 y.o.  female with history of ESRD on HD MWF, VTE on Eliquis, chronic left hip pain-referred to the ED by oncologist for evaluation of tension/chills and a mechanical fall.  She was found to have significant leukocytosis-and subsequently admitted to the hospitalist service.   Post admission-blood cultures (prelim) with positive for MSSA.  Significant events: 5/3-5/5>> Hospitalized for malfunctioning HD catheter (exchanged by IR) and NSTEMI-LHC with small vessel disease 5/18>>Admit for chills/hypotension/mechanical fall-blood cultures +ve for MSSA  Significant studies: 5/18>> CXR: No PNA 5/18>> CT head: No acute intracranial abnormality 5/18>> MRI left hip: Abnormal edema in the left sacral alla-possible infiltrative-possible stress fracture. 5/21 >> Echocardiogram - 1. Left ventricular ejection fraction, by estimation, is 70 to 75%. The left ventricle has hyperdynamic function. The left ventricle has no regional wall motion abnormalities. There is moderate left ventricular hypertrophy. Left ventricular diastolic parameters are consistent with Grade I diastolic dysfunction (impaired relaxation).  2. Right ventricular systolic function is normal. The right ventricular size is normal.  3. No vegetation seen. The mitral valve is abnormal. No evidence of mitral valve regurgitation. No evidence of mitral stenosis. Moderate mitral annular calcification. No vegetation seen.  4. Increased transvalvular gradient (max 42 mmHg) likely a combination of LVOT obstruction and possibly mild aortic valvular stenosis.     No vegetation seen.. The aortic valve is tricuspid. There is mild calcification of the aortic valve. Aortic valve regurgitation is not  visualized. No vegetation seen  5. No signifcaitn change compared to outpatient study in 03/2022. 5/22 >> due for new tunneled catheter placement for dialysis  Significant microbiology data: 5/18>> COVID/influenza PCR: Negative 5/18>> blood culture: Gram-positive cocci (MSSA on BCID)  Procedures: 5/3>> HD catheter exchanged by IR. 5/4>> left heart catheterization by Dr. Einar Gip  Consults: ID, nephrology, orthopedics Dr. Mable Fill  Subjective:   Patient in bed, appears comfortable, denies any headache, no fever, no chest pain or pressure, no shortness of breath , no abdominal pain. No new focal weakness, Improved L. Hip pain.   Objective: Vitals: Blood pressure (!) 134/33, pulse (!) 105, temperature 97.8 F (36.6 C), temperature source Axillary, resp. rate (!) 22, height '5\' 2"'$  (1.575 m), weight 89.9 kg, SpO2 94 %.   Exam:  Awake Alert, No new F.N deficits, Normal affect S.N.P.J..AT,PERRAL Supple Neck, No JVD,   Symmetrical Chest wall movement, Good air movement bilaterally, CTAB RRR,No Gallops, Rubs or new Murmurs,  +ve B.Sounds, Abd Soft, No tenderness,   No Cyanosis, Clubbing or edema    Assessment/Plan:  Sepsis due to MSSA bacteremia: BP improved-culture data as above-obtain echo. She does have a HD catheter-catheter site appears benign without any obvious discharge.  HD catheter was recently exchanged by IR on 5/3 during her prior hospitalization.  Catheter again removed on 05/17/2022 by IR with possible new catheter placement on 05/21/2022, ID and nephrology directing catheter care and placement, seen by orthopedics due to questionable CT of the left hip.  Echocardiogram stable.  Acute on chronic left-sided hip pain: Left hip MRI noted with possible left hip stress fracture, muscle strain and possible osteomyelitis/infection.  Seen by orthopedics Dr. Mable Fill and  IR, not enough fluid to be tapped, her pain is on the left lateral side around the gluteal tuberosity area, this pain was most  likely caused by trochanteric bursitis, she is much improved after Toradol, Bengay cream and supportive care.  Continue.  PT OT.  Hypotension: Resolved-suspect this was likely due to sepsis/MSSA bacteremia-in recent diarrheal illness during Mother's Day weekend (resolved).  ESRD on HD MWF: Nephrology following.  Normocytic anemia: Due to ESRD-defer Aranesp/IV iron to nephrology.  Hyperkalemia: Mild-for HD later today.  Repeat electrolytes tomorrow.  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  During Placement on 05/21/2022 she will be placed on heparin drip for 24 hours thereafter resume Eliquis.  Recent non-STEMI: No chest pain-recent LHC negative for large vessel disease-suspected to have small vessel disease.  Cardiology recommended aspirin x30 days along with Eliquis.  Chronic HFpEF: Euvolemic-volume removal with HD  HTN: BP soft-continue to hold Coreg  HLD: On statin  History of PAD s/p left femoral popliteal bypass in 2014  History of colon cancer: Follows with oncology/Dr. Collene Mares in the outpatient setting-in remission   OSA: Apparently not on CPAP at home.  Tobacco abuse: Counseled    Obesity: Estimated body mass index is 36.25 kg/m as calculated from the following:   Height as of this encounter: '5\' 2"'$  (1.575 m).   Weight as of this encounter: 89.9 kg.   Code status:   Code Status: Full Code   DVT Prophylaxis:     Family Communication: Daughter-Laticia Sword-[phone]-left voicemail on 5/19   Disposition Plan: Status is: Inpatient Remains inpatient appropriate because: Sepsis due to MSSA bacteremia-noted stable for discharge.   Planned Discharge Destination:Home   Diet: Diet Order             Diet renal with fluid restriction Fluid restriction: 1200 mL Fluid; Room service appropriate? Yes; Fluid consistency: Thin  Diet effective now                   MEDICATIONS: Scheduled Meds:   aspirin  81 mg Oral Daily   atorvastatin  10 mg Oral QPM   Chlorhexidine Gluconate Cloth  6 each Topical Q0600   colchicine  0.6 mg Oral Daily   darbepoetin (ARANESP) injection - DIALYSIS  60 mcg Intravenous Q Mon-HD   diclofenac Sodium  2 g Topical QID   diphenhydrAMINE  50 mg Oral Once   ferric citrate  840 mg Oral TID WC   gabapentin  300 mg Oral Daily   midodrine  5 mg Oral Q M,W,F-HD   pantoprazole  40 mg Oral Daily   predniSONE  50 mg Oral Q6H   Continuous Infusions:   ceFAZolin (ANCEF) IV 1 g (05/20/22 1642)   heparin 1,200 Units/hr (05/21/22 0700)   PRN Meds:.acetaminophen **OR** acetaminophen, albuterol, diphenhydrAMINE, ferric citrate, HYDROmorphone (DILAUDID) injection, oxyCODONE, polyethylene glycol   I have personally reviewed following labs and imaging studies  LABORATORY DATA: Recent Labs  Lab 05/17/22 0047 05/18/22 0433 05/19/22 2007 05/20/22 0131 05/21/22 0100  WBC 22.0* 16.9* 14.7* 15.3* 13.0*  HGB 10.8* 9.8* 9.5* 9.1* 9.4*  HCT 32.6* 31.3* 29.9* 29.9* 30.7*  PLT 192 250 276 267 286  MCV 92.1 95.4 94.6 96.8 95.9  MCH 30.5 29.9 30.1 29.4 29.4  MCHC 33.1 31.3 31.8 30.4 30.6  RDW 15.4 15.7* 14.9 15.2 14.9  LYMPHSABS  --   --   --  2.0 1.4  MONOABS  --   --   --  1.3* 0.7  EOSABS  --   --   --  0.4 0.4  BASOSABS  --   --   --  0.0 0.0    Recent Labs  Lab 05/16/22 1407 05/16/22 1740 05/17/22 0047 05/18/22 0433 05/19/22 2007 05/20/22 0131  NA 134*  --  130* 135 131* 134*  K 5.1  --  5.4* 5.0 4.2 4.6  CL 101  --  100 102 100 103  CO2 20*  --  15* 20* 18* 19*  GLUCOSE 122*  --  111* 100* 145* 122*  BUN 41*  --  47* 24* 36* 36*  CREATININE 8.21*  --  9.04* 5.49* 9.05* 9.12*  CALCIUM 8.8*  --  8.6* 8.4* 8.5* 8.7*  AST 21  --  25  --   --   --   ALT 16  --  14  --   --   --   ALKPHOS 109  --  89  --   --   --   BILITOT 0.6  --  0.8  --   --   --   ALBUMIN 2.5*  --  2.0* 2.0* 2.1* 2.0*  PHOS  --   --   --  2.9 3.8 3.9  LATICACIDVEN  --  1.1 1.1   --   --   --   INR 1.2  --   --   --   --   --     RADIOLOGY STUDIES/RESULTS: IR Fluoro Guide CV Line Right  Result Date: 05/20/2022 INDICATION: 66 year old woman with history of renal failure and multiple prior dialysis catheter placement and removal, most recently on 05/17/2022, presents to IR for tunneled IJ catheter placement. EXAM: 1. Ultrasound evaluation of bilateral internal jugular veins. 2. Attempted ultrasound-guided access of right internal and external jugular veins. MEDICATIONS: Ancef 2 g IV; The antibiotic was administered within an appropriate time interval prior to skin puncture. ANESTHESIA/SEDATION: Moderate (conscious) sedation was employed during this procedure. A total of Versed 1 mg and Fentanyl 50 mcg was administered intravenously by the radiology nurse. Total intra-service moderate Sedation Time: 16 minutes. The patient's level of consciousness and vital signs were monitored continuously by radiology nursing throughout the procedure under my direct supervision. FLUOROSCOPY: Radiation Exposure Index (as provided by the fluoroscopic device): 7 mGy Kerma COMPLICATIONS: None immediate. PROCEDURE: Informed written consent was obtained from the patient after a thorough discussion of the procedural risks, benefits and alternatives. All questions were addressed. Maximal Sterile Barrier Technique was utilized including caps, mask, sterile gowns, sterile gloves, sterile drape, hand hygiene and skin antiseptic. A timeout was performed prior to the initiation of the procedure. Patient positioned supine on the procedure table. Ultrasound evaluation of the left neck demonstrates thrombosed left internal jugular vein. Ultrasound evaluation of the right neck demonstrates partial thrombosis of the right internal jugular vein. The external jugular vein was patent. Sterile ultrasound probe cover and gel utilized throughout the procedure. Using continuous ultrasound guidance, the right internal jugular  vein was accessed with a 21 gauge needle. However guidewire could not be advanced centrally beyond the clavicle. Using continuous ultrasound guidance, the right external jugular vein was accessed with a 21 gauge needle. However guidewire could not be advanced centrally beyond the clavicle. Venogram could not be performed due to patient's dye allergy. Right neck accesses were removed and hemostasis achieved with manual compression. IMPRESSION: 1. Thrombosed left internal jugular vein. 2. Partially thrombosed right internal jugular vein. 3. Both right internal and external  jugular veins were accessed with 21 gauge needles, however guidewire could not be advanced beyond the clavicle. 4. Venogram could not be performed due to patient's dye allergy. PLAN: 1. IV dye allergy prep will be ordered. 2. Patient will be brought back tomorrow to once again attempt tunneled dialysis catheter placement and venogram if necessary. Electronically Signed   By: Miachel Roux M.D.   On: 05/20/2022 17:04   IR US Guide Vasc Access Right  Result Date: 05/20/2022 INDICATION: 66 year old woman with history of renal failure and multiple prior dialysis catheter placement and removal, most recently on 05/17/2022, presents to IR for tunneled IJ catheter placement. EXAM: 1. Ultrasound evaluation of bilateral internal jugular veins. 2. Attempted ultrasound-guided access of right internal and external jugular veins. MEDICATIONS: Ancef 2 g IV; The antibiotic was administered within an appropriate time interval prior to skin puncture. ANESTHESIA/SEDATION: Moderate (conscious) sedation was employed during this procedure. A total of Versed 1 mg and Fentanyl 50 mcg was administered intravenously by the radiology nurse. Total intra-service moderate Sedation Time: 16 minutes. The patient's level of consciousness and vital signs were monitored continuously by radiology nursing throughout the procedure under my direct supervision. FLUOROSCOPY: Radiation  Exposure Index (as provided by the fluoroscopic device): 7 mGy Kerma COMPLICATIONS: None immediate. PROCEDURE: Informed written consent was obtained from the patient after a thorough discussion of the procedural risks, benefits and alternatives. All questions were addressed. Maximal Sterile Barrier Technique was utilized including caps, mask, sterile gowns, sterile gloves, sterile drape, hand hygiene and skin antiseptic. A timeout was performed prior to the initiation of the procedure. Patient positioned supine on the procedure table. Ultrasound evaluation of the left neck demonstrates thrombosed left internal jugular vein. Ultrasound evaluation of the right neck demonstrates partial thrombosis of the right internal jugular vein. The external jugular vein was patent. Sterile ultrasound probe cover and gel utilized throughout the procedure. Using continuous ultrasound guidance, the right internal jugular vein was accessed with a 21 gauge needle. However guidewire could not be advanced centrally beyond the clavicle. Using continuous ultrasound guidance, the right external jugular vein was accessed with a 21 gauge needle. However guidewire could not be advanced centrally beyond the clavicle. Venogram could not be performed due to patient's dye allergy. Right neck accesses were removed and hemostasis achieved with manual compression. IMPRESSION: 1. Thrombosed left internal jugular vein. 2. Partially thrombosed right internal jugular vein. 3. Both right internal and external jugular veins were accessed with 21 gauge needles, however guidewire could not be advanced beyond the clavicle. 4. Venogram could not be performed due to patient's dye allergy. PLAN: 1. IV dye allergy prep will be ordered. 2. Patient will be brought back tomorrow to once again attempt tunneled dialysis catheter placement and venogram if necessary. Electronically Signed   By: Miachel Roux M.D.   On: 05/20/2022 17:04     LOS: 5 days    Signature  Lala Lund M.D on 05/21/2022 at 8:50 AM   -  To page go to [email or web address]

## 2022-05-22 ENCOUNTER — Other Ambulatory Visit (HOSPITAL_COMMUNITY): Payer: Self-pay

## 2022-05-22 DIAGNOSIS — I5032 Chronic diastolic (congestive) heart failure: Secondary | ICD-10-CM | POA: Diagnosis not present

## 2022-05-22 LAB — CBC WITH DIFFERENTIAL/PLATELET
Abs Immature Granulocytes: 0.2 10*3/uL — ABNORMAL HIGH (ref 0.00–0.07)
Basophils Absolute: 0 10*3/uL (ref 0.0–0.1)
Basophils Relative: 0 %
Eosinophils Absolute: 0 10*3/uL (ref 0.0–0.5)
Eosinophils Relative: 0 %
HCT: 28.7 % — ABNORMAL LOW (ref 36.0–46.0)
Hemoglobin: 9.6 g/dL — ABNORMAL LOW (ref 12.0–15.0)
Immature Granulocytes: 1 %
Lymphocytes Relative: 5 %
Lymphs Abs: 1 10*3/uL (ref 0.7–4.0)
MCH: 30.1 pg (ref 26.0–34.0)
MCHC: 33.4 g/dL (ref 30.0–36.0)
MCV: 90 fL (ref 80.0–100.0)
Monocytes Absolute: 0.5 10*3/uL (ref 0.1–1.0)
Monocytes Relative: 3 %
Neutro Abs: 18.8 10*3/uL — ABNORMAL HIGH (ref 1.7–7.7)
Neutrophils Relative %: 91 %
Platelets: 299 10*3/uL (ref 150–400)
RBC: 3.19 MIL/uL — ABNORMAL LOW (ref 3.87–5.11)
RDW: 14.5 % (ref 11.5–15.5)
WBC: 20.6 10*3/uL — ABNORMAL HIGH (ref 4.0–10.5)
nRBC: 0 % (ref 0.0–0.2)

## 2022-05-22 LAB — HEPARIN LEVEL (UNFRACTIONATED): Heparin Unfractionated: 1 IU/mL — ABNORMAL HIGH (ref 0.30–0.70)

## 2022-05-22 LAB — APTT
aPTT: 172 seconds (ref 24–36)
aPTT: 126 seconds — ABNORMAL HIGH (ref 24–36)

## 2022-05-22 MED ORDER — PANTOPRAZOLE SODIUM 40 MG PO TBEC
40.0000 mg | DELAYED_RELEASE_TABLET | Freq: Every day | ORAL | 0 refills | Status: DC
  Filled 2022-05-22: qty 30, 30d supply, fill #0

## 2022-05-22 MED ORDER — DICLOFENAC SODIUM 1 % EX GEL
2.0000 g | Freq: Four times a day (QID) | CUTANEOUS | 0 refills | Status: DC
  Filled 2022-05-22: qty 100, 25d supply, fill #0

## 2022-05-22 MED ORDER — CEFAZOLIN SODIUM-DEXTROSE 1-4 GM/50ML-% IV SOLN: 1.0000 g | INTRAVENOUS | 0 refills | Status: AC

## 2022-05-22 MED ORDER — ACETAMINOPHEN 325 MG PO TABS
650.0000 mg | ORAL_TABLET | Freq: Three times a day (TID) | ORAL | 0 refills | Status: DC | PRN
  Filled 2022-05-22: qty 40, 7d supply, fill #0

## 2022-05-22 MED ORDER — CHLORHEXIDINE GLUCONATE CLOTH 2 % EX PADS: 6.0000 | MEDICATED_PAD | Freq: Every day | CUTANEOUS | Status: DC

## 2022-05-22 MED ORDER — APIXABAN 5 MG PO TABS
5.0000 mg | ORAL_TABLET | Freq: Two times a day (BID) | ORAL | Status: DC
  Administered 2022-05-22: 5 mg via ORAL
  Filled 2022-05-22: qty 1

## 2022-05-22 MED ORDER — OMEPRAZOLE 20 MG PO CPDR
20.0000 mg | DELAYED_RELEASE_CAPSULE | Freq: Every day | ORAL | 0 refills | Status: DC
  Filled 2022-05-22: qty 30, 30d supply, fill #0

## 2022-05-22 MED ORDER — COLCHICINE 0.6 MG PO TABS
0.6000 mg | ORAL_TABLET | Freq: Every day | ORAL | 0 refills | Status: DC
  Filled 2022-05-22: qty 10, 10d supply, fill #0

## 2022-05-22 MED ORDER — APIXABAN 5 MG PO TABS
5.0000 mg | ORAL_TABLET | Freq: Two times a day (BID) | ORAL | 0 refills | Status: DC
  Filled 2022-05-22: qty 60, 30d supply, fill #0

## 2022-05-22 NOTE — Progress Notes (Signed)
Occupational Therapy Treatment Patient Details Name: Whitney Walker MRN: [id number] DOB: Jun 18, 1956 Today's Date: 05/22/2022   History of present illness Pt is a 66 y.o. female admitted 5/18 with diagnosis of hypotension and fall. PMH: ESRD on HD MWF, COPD, CAD, DVT on chronic anticoagulation, HTN, OA with chronic left hip pain, depression, history of PE.   OT comments  Pt with concerns of her R hand being weak, OT did not appreciate any focal weakness of R hand interfering with ADLs, but provided squeeze ball and therapy putty and educated in use. Educated in fall prevention, including other options to avoid getting down in tub. Pt receptive.    Recommendations for follow up therapy are one component of a multi-disciplinary discharge planning process, led by the attending physician.  Recommendations may be updated based on patient status, additional functional criteria and insurance authorization.    Follow Up Recommendations  Home health OT    Assistance Recommended at Discharge Intermittent Supervision/Assistance  Patient can return home with the following  A little help with bathing/dressing/bathroom;Assistance with cooking/housework;Assist for transportation;Help with stairs or ramp for entrance   Equipment Recommendations  BSC/3in1    Recommendations for Other Services      Precautions / Restrictions Precautions Precautions: Fall Precaution Comments: watch BP Restrictions Weight Bearing Restrictions: No       Mobility Bed Mobility               General bed mobility comments: in chair    Transfers Overall transfer level: Modified independent                       Balance Overall balance assessment: Needs assistance   Sitting balance-Leahy Scale: Good     Standing balance support: Bilateral upper extremity supported Standing balance-Leahy Scale: Fair Standing balance comment: reliant on RW for dynamic balance, fair statically                            ADL either performed or assessed with clinical judgement   ADL Overall ADL's : Needs assistance/impaired     Grooming: Wash/dry hands;Standing;Supervision/safety               Lower Body Dressing: Supervision/safety;Sit to/from stand   Toilet Transfer: Supervision/safety;Ambulation;Rolling walker (2 wheels)   Toileting- Clothing Manipulation and Hygiene: Supervision/safety;Sit to/from stand       Functional mobility during ADLs: Supervision/safety;Rolling walker (2 wheels) General ADL Comments: educated pt in benefits of hand held shower head and tub transfer bench, pt stating she has not been able to find a device to prevent HD catheter from getting wet and will likely continue to get down in tub, educated pt to wait until her daughter is there and/or to keep her phone nearby if she chooses to sit down in tub    Extremity/Trunk Assessment              Vision       Perception     Praxis      Cognition Arousal/Alertness: Awake/alert Behavior During Therapy: WFL for tasks assessed/performed Overall Cognitive Status: Within Functional Limits for tasks assessed                                          Exercises Exercises: Other exercises Other Exercises Other Exercises: provided squeeze ball and med soft  theraputty and educated in [place] for R hand    Shoulder Instructions       General Comments Pt reporting she plans to get down into bathtub to bathe. Advised against this as getting in/out of tub will likely be difficult with left glute pain. Offered pt should do "sponge bath" and she is adamant that she will not be clean with that. Notified OT to try to further address with pt.    Pertinent Vitals/ Pain       Pain Assessment Pain Assessment: No/denies pain  Home Living                                          Prior Functioning/Environment              Frequency  Min 2X/week         Progress Toward Goals  OT Goals(current goals can now be found in the care plan section)  Progress towards OT goals: Progressing toward goals  Acute Rehab OT Goals OT Goal Formulation: With patient Time For Goal Achievement: 05/31/22 Potential to Achieve Goals: Good  Plan Discharge plan remains appropriate    Co-evaluation                 AM-PAC OT "6 Clicks" Daily Activity     Outcome Measure   Help from another person eating meals?: None Help from another person taking care of personal grooming?: A Little Help from another person toileting, which includes using toliet, bedpan, or urinal?: A Little Help from another person bathing (including washing, rinsing, drying)?: A Little Help from another person to put on and taking off regular upper body clothing?: A Little Help from another person to put on and taking off regular lower body clothing?: None 6 Click Score: 20    End of Session Equipment Utilized During Treatment: Rolling walker (2 wheels)  OT Visit Diagnosis: Unsteadiness on feet (R26.81);Other abnormalities of gait and mobility (R26.89);Pain;Muscle weakness (generalized) (M62.81)   Activity Tolerance Patient tolerated treatment well   Patient Left in chair;with call bell/phone within reach   Nurse Communication          Time: 0109-3235 OT Time Calculation (min): 38 min  Charges: OT General Charges $OT Visit: 1 Visit OT Treatments $Self Care/Home Management : 8-22 mins $Therapeutic Exercise: 8-22 mins  Nestor Lewandowsky, OTR/L Acute Rehabilitation Services Pager: [phone] Office: [phone]   Malka So 05/22/2022, 12:02 PM

## 2022-05-22 NOTE — TOC Transition Note (Signed)
Transition of Care [facility]) - CM/SW Discharge Note   Patient Details  Name: Whitney Walker MRN: [id number] Date of Birth: December 05, 1956  [facility] CM/SW Contact:  Carles Collet, RN Phone Number: 05/22/2022, 10:57 AM   Clinical Narrative:   [facility] of DC. No other TOC needs identified.     Final next level of care: Home/Self Care Barriers to Discharge: No Barriers Identified   Patient Goals and CMS Choice Patient states their goals for this hospitalization and ongoing recovery are:: to go home CMS Medicare.gov Compare Post Acute Care list provided to:: Patient Choice offered to / list presented to : Patient  Discharge Placement                       Discharge Plan and Services   Discharge Planning Services: CM Consult Post Acute Care Choice: Home Health          DME Arranged: N/A         HH Arranged: PT, OT HH Agency: [place] Date [place]: 05/22/22 Time [place]: [id number] Representative spoke with at [place]: [place] ([place]) Interventions     Readmission Risk Interventions    05/21/2022    4:20 PM 11/01/2020   12:53 PM  Readmission Risk Prevention Plan  Transportation Screening Complete Complete  PCP or Specialist Appt within 3-5 Days  Complete  HRI or [place]  Complete  Social Work Consult for [place] Planning/Counseling  Complete  Palliative Care Screening  Not Applicable  Medication Review Press photographer) Referral to Pharmacy Complete  HRI or [place] Complete   SW Recovery Care/Counseling Consult Complete   Palliative Care Screening Not [place] Not Applicable

## 2022-05-22 NOTE — Discharge Summary (Signed)
Whitney Walker TIR:[id number] DOB: January 05, 1956 DOA: 05/16/2022  PCP: [name], FNP  Admit date: 05/16/2022  Discharge date: 05/22/2022  Admitted From: Home   Disposition:  Home   Recommendations for Outpatient Follow-up:   Follow up with PCP in 1-2 weeks  PCP Please obtain BMP/CBC, 2 view CXR in 1week,  (see Discharge instructions)   PCP Please follow up on the following pending results:    Home Health: PT if qualifies   Equipment/Devices: None  Consultations: Renal, Orthopedics Discharge Condition: Stable    CODE STATUS: Full    Diet Recommendation: Renal with 1.2 L fluid restriction per day    Chief Complaint  Patient presents with   Weakness   Hypotension     Brief history of present illness from the day of admission and additional interim summary    67 y.o.  female with history of ESRD on HD MWF, VTE on Eliquis, chronic left hip pain-referred to the ED by oncologist for evaluation of tension/chills and a mechanical fall.  She was found to have significant leukocytosis-and subsequently admitted to the hospitalist service.   Post admission-blood cultures (prelim) with positive for MSSA.   Significant events: 5/3-5/5>> Hospitalized for malfunctioning HD catheter (exchanged by IR) and NSTEMI-LHC with small vessel disease 5/18>>Admit for chills/hypotension/mechanical fall-blood cultures +ve for MSSA   Significant studies: 5/18>> CXR: No PNA 5/18>> CT head: No acute intracranial abnormality 5/18>> MRI left hip: Abnormal edema in the left sacral alla-possible infiltrative-possible stress fracture. 5/21 >> Echocardiogram - 1. Left ventricular ejection fraction, by estimation, is 70 to 75%. The left ventricle has hyperdynamic function. The left ventricle has no regional wall motion abnormalities.  There is moderate left ventricular hypertrophy. Left ventricular diastolic parameters are consistent with Grade I diastolic dysfunction (impaired relaxation).  2. Right ventricular systolic function is normal. The right ventricular size is normal.  3. No vegetation seen. The mitral valve is abnormal. No evidence of mitral valve regurgitation. No evidence of mitral stenosis. Moderate mitral annular calcification. No vegetation seen.  4. Increased transvalvular gradient (max 42 mmHg) likely a combination of LVOT obstruction and possibly mild aortic valvular stenosis.     No vegetation seen.. The aortic valve is tricuspid. There is mild calcification of the aortic valve. Aortic valve regurgitation is not visualized. No vegetation seen  5. No signifcaitn change compared to outpatient study in 03/2022. 5/22 >> due for new tunneled catheter placement for dialysis   Significant microbiology data: 5/18>> COVID/influenza PCR: Negative 5/18>> blood culture: Gram-positive cocci (MSSA on BCID)   Procedures: 5/3>> HD catheter exchanged by IR. 5/4>> left heart catheterization by Dr. Einar Gip   Consults: ID, nephrology, orthopedics Dr. Mable Fill  Hospital Course    Sepsis due to MSSA bacteremia: BP improved-culture data as above-obtain echo. She does have a HD catheter-catheter site appears benign without any obvious discharge.  HD catheter was recently exchanged by IR on 5/3 during her prior hospitalization.  Catheter again removed on 05/17/2022 by IR with possible new catheter placement done on 05/21/2022, ID and nephrology directing catheter care and placement, Cefazolin 2 gm after HD through July 1st , also seen by orthopedics due to questionable CT of the left hip.  Echocardiogram stable.  She will be discharged today with outpatient ID and PCP follow-up.   Acute on chronic left-sided hip pain: Left hip MRI noted with possible left hip stress fracture,  muscle strain and possible osteomyelitis/infection.  Seen by orthopedics Dr. Mable Fill and IR, not enough fluid to be tapped, her pain is on the left lateral side around the gluteal tuberosity area, this pain was most likely caused by trochanteric bursitis, she is much improved after Toradol, Bengay cream and supportive care.  Now almost completely pain-free, discharged home with Bengay cream, as needed Tylenol and PCP monitoring.  She also has a pending orthopedic follow-up.   Hypotension: Resolved-suspect this was likely due to sepsis/MSSA bacteremia-in recent diarrheal illness during Mother's Day weekend (resolved).   ESRD on HD MWF: Nephrology following.   Normocytic anemia: Due to ESRD-defer Aranesp/IV iron to nephrology.   Hyperkalemia: Mild-for HD later today.  Repeat electrolytes tomorrow.  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  During Placement on 05/21/2022 she was placed on heparin drip for 24 hours thereafter resume Eliquis.  Recent non-STEMI: No chest pain-recent LHC negative for large vessel disease-suspected to have small vessel disease.  Cardiology recommended aspirin x30 days along with Eliquis.  Will recommend PCP to monitor closely.   Chronic HFpEF: Euvolemic-volume removal with HD   HTN: BP stable continue home dose beta-blocker.   HLD: On statin   History of PAD s/p left femoral popliteal bypass in 2014   History of colon cancer: Follows with oncology/Dr. Collene Mares in the outpatient setting-in remission   OSA: Apparently not on CPAP at home.   Tobacco abuse: Counseled to stop.   Discharge diagnosis     Principal Problem:   Fall Active Problems:   Leucocytosis   Chronic diastolic heart failure (HCC)   Essential hypertension   Depression   Morbid (severe) obesity due to excess calories (HCC)   ESRD (end stage renal disease) ([place])   History of pulmonary embolism   Left hip pain   Tobacco abuse    Sepsis due to other specified Staphylococcus [place])    Discharge instructions    Discharge Instructions     Increase activity slowly   Complete by: As directed    No wound care   Complete by: As directed        Discharge Medications   Allergies as of 05/22/2022       Reactions   Carnosine    Other reaction(s): Unknown   Gadolinium Derivatives Hives, Other (See Comments)   HIVES, Desc: HIVES W/ "DYE" USED FOR 1ST CT SCAN BUT NOT 2ND, NO PREMEDS USED, PT UNCERTAIN OF CIRCUMSTANCES,,?POSSIBLE MRI CONTRAST ALLERGY, ALL STUDIES DONE "SOMEWHERE" IN [place]//A.C., Onset Date: [id number]   Iohexol Hives, Other (See Comments)   Desc: HIVES W/ "DYE" USED FOR 1ST CT SCAN BUT NOT 2ND, NO PREMEDS USED, PT UNCERTAIN OF CIRCUMSTANCES,,?POSSIBLE MRI CONTRAST ALLERGY, ALL STUDIES DONE "SOMEWHERE" IN [place]//A.C., Onset  Date: [id number]   Iodinated Contrast Media Hives   Iodine Hives   Naltrexone Other (See Comments)   Unknown        Medication List     STOP taking these medications    cyclobenzaprine 10 MG tablet Commonly known as: FLEXERIL   omeprazole 20 MG capsule Commonly known as: PRILOSEC   oxyCODONE-acetaminophen 5-325 MG tablet Commonly known as: Percocet       TAKE these medications    acetaminophen 650 MG CR tablet Commonly known as: TYLENOL Take 1 tablet (650 mg total) by mouth every 8 (eight) hours as needed for pain. Do not exceed more than '4000mg'$  (or 6 capsules) in 24hrs. What changed:  how much to take when to take this   albuterol 108 (90 Base) MCG/ACT inhaler Commonly known as: VENTOLIN HFA Inhale 2 puffs into the lungs every 6 (six) hours as needed for wheezing or shortness of breath.   apixaban 5 MG Tabs tablet Commonly known as: ELIQUIS Take 1 tablet (5 mg total) by mouth 2 (two) times daily. What changed:  medication strength how much to take   aspirin 81 MG chewable tablet Chew 1 tablet (81 mg total) by mouth daily.   atorvastatin  10 MG tablet Commonly known as: LIPITOR TAKE ONE TABLET BY MOUTH ONCE DAILY What changed: when to take this   Auryxia 1 GM 210 MG(Fe) tablet Generic drug: ferric citrate Take 420-840 mg by mouth See admin instructions. Take '840mg'$  by mouth three times daily with meals and '420mg'$  with snacks.   Biotin 10000 MCG Tabs Take 10,000 mcg by mouth daily.   ceFAZolin 1-4 GM/50ML-% Soln Commonly known as: ANCEF Inject 50 mLs (1 g total) into the vein daily for 8 days.   colchicine 0.6 MG tablet Take 1 tablet (0.6 mg total) by mouth daily.   diclofenac Sodium 1 % Gel Commonly known as: VOLTAREN Apply 2 g topically 4 (four) times daily.   gabapentin 300 MG capsule Commonly known as: NEURONTIN Take 300 mg by mouth daily.   meclizine 12.5 MG tablet Commonly known as: ANTIVERT Take 1 tablet (12.5 mg total) by mouth 3 (three) times daily as needed for dizziness.   metoprolol succinate 25 MG 24 hr tablet Commonly known as: TOPROL-XL Take 1 tablet (25 mg total) by mouth daily.   midodrine 10 MG tablet Commonly known as: PROAMATINE Take 5 mg by mouth every Monday, Wednesday, and Friday with hemodialysis.   multivitamin Tabs tablet Take 1 tablet by mouth every evening.   pantoprazole 40 MG tablet Commonly known as: PROTONIX Take 1 tablet (40 mg total) by mouth daily. Start taking on: May 23, 2022   vitamin C 100 MG tablet Take 100 mg by mouth daily.   vitamin E 200 UNIT capsule Take 200 Units by mouth daily.         Follow-up Information     [facility] Follow up.   Specialty: Home Health Services Why: for home health services. they will call you to set up a time to come out to see you at home Contact information: [place] [address] [phone]         [name], MD Follow up on 07/04/2022.   Specialty: Infectious Diseases Why: Hospital Discharge Follow Up 9:30 am Contact information: [address] [place] [address] [phone]         [name], FNP. Schedule an appointment as soon as possible  for a visit in 1 week(s).   Specialty: General Practice Contact information: [address] [place] [address] [phone]                 Major procedures and Radiology Reports - PLEASE review detailed and final reports thoroughly  -       DG Chest 2 View  Result Date: 05/01/2022 CLINICAL DATA:  Chest pain, nonradiating chest pain in a 66 year old female. EXAM: CHEST - 2 VIEW COMPARISON:  July 17, 2021. FINDINGS: LEFT-sided dialysis catheter terminates in the RIGHT atrium after air via LEFT IJ approach. Trachea is midline. Cardiomediastinal contours and hilar structures with mild cardiac enlargement and central pulmonary vascular engorgement similar to the prior study. No frank edema. No consolidation. No sign of pleural effusion. On limited assessment there is no acute skeletal process. IMPRESSION: Mild cardiac enlargement and central pulmonary vascular engorgement. Electronically Signed   By: Zetta Bills M.D.   On: 05/01/2022 08:31   CT Head Wo Contrast  Result Date: 05/16/2022 CLINICAL DATA:  Weakness. EXAM: CT HEAD WITHOUT CONTRAST TECHNIQUE: Contiguous axial images were obtained from the base of the skull through the vertex without intravenous contrast. RADIATION DOSE REDUCTION: This exam was performed according to the departmental dose-optimization program which includes automated exposure control, adjustment of the mA and/or kV according to patient size and/or use of iterative reconstruction technique. COMPARISON:  March 09, 2014. FINDINGS: Brain: No evidence of acute infarction, hemorrhage, hydrocephalus, extra-axial collection or mass lesion/mass effect. Vascular: No hyperdense vessel or unexpected calcification. Skull: Normal. Negative for fracture or focal lesion. Sinuses/Orbits: No acute finding. Other: None. IMPRESSION: No acute intracranial  abnormality seen. Electronically Signed   By: Marijo Conception M.D.   On: 05/16/2022 16:01   IR Venocavagram Svc  Result Date: 05/21/2022 INDICATION: End-stage renal disease, recent line infections, no current access, limited venous access options EXAM: ULTRASOUND GUIDANCE FOR VASCULAR ACCESS LEFT EJ VENOUS ACCESS AND PLACEMENT OF A TUNNELED DIALYSIS CATHETER. CENTRAL SVC VENOGRAM WITH 12 MM CENTRAL INNOMINATE VENOUS ANGIOPLASTY MEDICATIONS: Ancef 2 g; The antibiotic was administered within an appropriate time interval prior to skin puncture. ANESTHESIA/SEDATION: Moderate (conscious) sedation was employed during this procedure. A total of Versed 2.0 mg and Fentanyl 100 mcg was administered intravenously by the radiology nurse. Total intra-service moderate Sedation Time: 46 minutes. The patient's level of consciousness and vital signs were monitored continuously by radiology nursing throughout the procedure under my direct supervision. FLUOROSCOPY: Radiation Exposure Index (as provided by the fluoroscopic device): 474 mGy Kerma COMPLICATIONS: None immediate. PROCEDURE: Informed written consent was obtained from the patient after a thorough discussion of the procedural risks, benefits and alternatives. All questions were addressed. Maximal Sterile Barrier Technique was utilized including caps, mask, sterile gowns, sterile gloves, sterile drape, hand hygiene and skin antiseptic. A timeout was performed prior to the initiation of the procedure. Under sterile conditions and local anesthesia, percutaneous micropuncture needle access performed of a dilated left external jugular vein in the mid supraclavicular region. 018 guidewire advanced centrally to the innominate venous confluence. Micro dilator set advanced. Guidewire access advanced into the SVC with some difficulty through the innominate SVC junction. Catheter advanced followed by placement of an Amplatz guidewire into the IVC. Measurements obtained for the  appropriate length. Subcutaneous tunnel was created in the left infraclavicular chest. The 28 cm palindrome catheter was tunneled subcutaneously to the venotomy site and attempts were made to advance the catheter into the SVC through the valve peel-away sheath.  Catheter would not advanced through the innominate SVC junction suggesting central venous stenosis. Catheter eventually had to be removed with insertion of a 12 French sheath. Through the sheath, central venogram performed over Amplatz guidewires. 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 12 mm prolonged angioplasty performed of the central left innominate vein stenosis with a 12 x 4 atlas balloon. Single inflation for 1 minute at 18 atmospheres. Balloon was deflated and removed. Over double stiff Amplatz guidewire access, eventually the 28 cm palindrome catheter was advanced into the SVC RA junction. Position confirmed with fluoroscopy. Images obtained for documentation. Blood aspirated easily followed by saline and heparin flushes. Appropriate volume strength of heparin instilled in both lumens followed by external caps. Catheter secured with Prolene suture. Venotomy site closed with subcuticular Vicryl suture and Dermabond. Sterile dressing applied. No immediate complication. Patient tolerated the procedure well. IMPRESSION: Successful left external jugular tunnel HD catheter requiring 12 mm venous angioplasty of the central left innominate vein as detailed above to allow catheter placement. Tip SVC RA junction.  Ready for use. Please note it will be critical to maintain this catheter access as the patient has limited upper extremity central venous options and currently refuses use of the femoral veins for tunneled catheter access. Electronically Signed   By: Jerilynn Mages.  Shick M.D.   On: 05/21/2022 16:38   CARDIAC CATHETERIZATION  Addendum Date: 05/02/2022   Left Heart Catheterization 05/02/22: RCA:  Dominant.  Mild diffuse disease. Left main: Large vessel, very short and immediately bifurcates.  Calcified. LCx: Large vessel, [place] to a very high large OM1, a moderate-sized OM 2 distally, mild to moderate diffuse disease is present.  No high-grade stenosis. LAD: Large vessel, gives coronary to a very large D1, mild diffuse disease again evident without any focal high-grade stenosis.  Diffuse coronary calcification is again evident.  LAD is tortuous in the mid to distal segment suggestive of hypertensive heart disease. Recommendation: [name] positive cardiac markers probably related to very small vessel disease.  Do not see any obvious dissection in the major coronary vessels or thrombus formation or occlusion.  No significant change in coronary anatomy since prior angiography a year ago in 2022.  Recommend continuing medical therapy.  30 mL contrast utilized.  Result Date: 05/02/2022 Left Heart Catheterization 05/02/22: RCA: Dominant.  Mild diffuse disease. Left main: Large vessel, very short and immediately bifurcates.  Calcified. LCx: Large vessel, Ugalde to a very high large OM1, a moderate-sized OM 2 distally, mild to moderate diffuse disease is present.  No high-grade stenosis. LAD: Large vessel, gives coronary to a very large D1, mild diffuse disease again evident without any focal high-grade stenosis.  Diffuse coronary calcification is again evident.  LAD is tortuous in the mid to distal segment suggestive of hypertensive heart disease. Recommendation: [name] positive cardiac markers probably related to very small vessel disease.  Do not see any obvious dissection in the major coronary vessels or thrombus formation or occlusion.  No significant change in coronary anatomy since prior angiography a year ago in 2022.  Recommend continuing medical therapy.  30 mL contrast utilized.   MR HIP LEFT WO CONTRAST  Result Date: 05/16/2022 CLINICAL DATA:  Left hip pain.  Leukocytosis. EXAM: MR OF THE LEFT  HIP WITHOUT CONTRAST TECHNIQUE: Multiplanar, multisequence MR imaging was performed. No intravenous contrast was administered. COMPARISON:  Radiographs 04/29/2022 and CT scan 07/16/2021 FINDINGS: Bones: Nonspecific abnormal edema signal in the left sacral ala, and tracking along  the posterior margin of the left iliacus muscle. There is presacral edema in this region, tracking around the left sacral plexus for example on image 12 series 6. Further distally the sciatic nerve appears normal. No effusion of the SI joint; no marrow edema along the iliac side of the left SI joint. Today's exam was focused on the left hip and not on the sacrum. There is no hip effusion or marrow abnormality in the left proximal femur. Mild degenerative spurring of both femoral heads. Articular cartilage and labrum Articular cartilage:  Unremarkable Labrum:  Grossly unremarkable. Joint or bursal effusion Joint effusion:  Absent Bursae: No regional bursitis. Muscles and tendons Muscles and tendons: Small amount of abnormal edema signal anteriorly in the left gluteus maximus muscle near the left greater trochanter as shown on image 23 series 6. As noted above there is some low-grade edema tracking along the deep margin of the left iliacus muscle. Other findings Miscellaneous:   No supplemental non-categorized findings. IMPRESSION: 1. Abnormal edema in the left sacral ala is infiltrative but nonspecific. A stress fracture could potentially give this appearance and the adjacent edema tracking along the left iliacus muscle and left sacral plexus. Left sacral osteomyelitis is not totally excluded but there no effusion of the SI joint or edema along the iliac side of the SI joint to further suggest septic SI joint. 2. Low-level edema anteriorly in the gluteus maximus muscle near the greater trochanter, nonspecific but possibly reflecting a muscle strain. Electronically Signed   By: Van Clines M.D.   On: 05/16/2022 21:55   IR Fluoro Guide  CV Line Left  Result Date: 05/02/2022 INDICATION: 66 year old female with end-stage renal disease on hemodialysis has a malfunctioning left IJ tunneled dialysis catheter EXAM: XA - EXTERNAL REPAIR CV CATH MEDICATIONS: None; The antibiotic was administered within an appropriate time interval prior to skin puncture. ANESTHESIA/SEDATION: Procedure was performed under local anesthesia FLUOROSCOPY: Radiation Exposure Index (as provided by the fluoroscopic device): 53.6 mGy Kerma COMPLICATIONS: None immediate. PROCEDURE: Informed written consent was obtained from the patient after a thorough discussion of the procedural risks, benefits and alternatives. All questions were addressed. Maximal Sterile Barrier Technique was utilized including caps, mask, sterile gowns, sterile gloves, sterile drape, hand hygiene and skin antiseptic. A timeout was performed prior to the initiation of the procedure. Existing left IJ tunneled catheter site was prepped and draped in sterile fashion. 1% xylocaine was administered for local anesthesia. Then, with blunt and sharp dissection, the cuff of the catheter was released from the surrounding tissue, 035 inch stiff Glidewire was inserted, the catheter was removed and a new 14.5 [place] by 23 cm palindrome catheter was advanced over the Glidewire, to place the tip of the catheter at the caval atrial junction. Both the ports of the catheter aspirated and flushed briskly. The ports were flushed with saline and heparin, the catheter was anchored to the skin using 0 Prolene and a sterile dressing including a Biopatch was placed. Fluoroscopic spot images obtained. IMPRESSION: Fluoroscopic guided left transjugular tunneled dialysis catheter exchange. Electronically Signed   By: Frazier Richards M.D.   On: 05/02/2022 08:48   IR Fluoro Guide CV Line Right  Result Date: 05/20/2022 INDICATION: 66 year old woman with history of renal failure and multiple prior dialysis catheter placement and removal,  most recently on 05/17/2022, presents to IR for tunneled IJ catheter placement. EXAM: 1. Ultrasound evaluation of bilateral internal jugular veins. 2. Attempted ultrasound-guided access of right internal and external jugular veins. MEDICATIONS: Ancef 2  g IV; The antibiotic was administered within an appropriate time interval prior to skin puncture. ANESTHESIA/SEDATION: Moderate (conscious) sedation was employed during this procedure. A total of Versed 1 mg and Fentanyl 50 mcg was administered intravenously by the radiology nurse. Total intra-service moderate Sedation Time: 16 minutes. The patient's level of consciousness and vital signs were monitored continuously by radiology nursing throughout the procedure under my direct supervision. FLUOROSCOPY: Radiation Exposure Index (as provided by the fluoroscopic device): 7 mGy Kerma COMPLICATIONS: None immediate. PROCEDURE: Informed written consent was obtained from the patient after a thorough discussion of the procedural risks, benefits and alternatives. All questions were addressed. Maximal Sterile Barrier Technique was utilized including caps, mask, sterile gowns, sterile gloves, sterile drape, hand hygiene and skin antiseptic. A timeout was performed prior to the initiation of the procedure. Patient positioned supine on the procedure table. Ultrasound evaluation of the left neck demonstrates thrombosed left internal jugular vein. Ultrasound evaluation of the right neck demonstrates partial thrombosis of the right internal jugular vein. The external jugular vein was patent. Sterile ultrasound probe cover and gel utilized throughout the procedure. Using continuous ultrasound guidance, the right internal jugular vein was accessed with a 21 gauge needle. However guidewire could not be advanced centrally beyond the clavicle. Using continuous ultrasound guidance, the right external jugular vein was accessed with a 21 gauge needle. However guidewire could not be advanced  centrally beyond the clavicle. Venogram could not be performed due to patient's dye allergy. Right neck accesses were removed and hemostasis achieved with manual compression. IMPRESSION: 1. Thrombosed left internal jugular vein. 2. Partially thrombosed right internal jugular vein. 3. Both right internal and external jugular veins were accessed with 21 gauge needles, however guidewire could not be advanced beyond the clavicle. 4. Venogram could not be performed due to patient's dye allergy. PLAN: 1. IV dye allergy prep will be ordered. 2. Patient will be brought back tomorrow to once again attempt tunneled dialysis catheter placement and venogram if necessary. Electronically Signed   By: Miachel Roux M.D.   On: 05/20/2022 17:04   IR Removal Tun Cv Cath W/O FL  Result Date: 05/17/2022 INDICATION: Patient history of end-stage renal disease on hemodialysis now admitted with bacteremia. Request removal of tunneled HD catheter for line holiday. Currently patient has tunneled left IJ catheter exchanged 05/02/2022 in IR. EXAM: REMOVAL OF TUNNELED HEMODIALYSIS CATHETER MEDICATIONS: None COMPLICATIONS: None immediate. PROCEDURE: Informed written consent was obtained from the patient following an explanation of the procedure, risks, benefits and alternatives to treatment. A time out was performed prior to the initiation of the procedure. Maximal barrier sterile technique was utilized including caps, mask, sterile gowns, sterile gloves, large sterile drape, hand hygiene, and ChloraPrep. Utilizing gentle traction, the catheter was removed intact. Hemostasis was obtained with manual compression. A dressing was placed. The patient tolerated the procedure well without immediate post procedural complication. IMPRESSION: Successful removal of LEFT chest tunneled dialysis catheter. Read by Candiss Norse, PA-C Electronically Signed   By: Michaelle Birks M.D.   On: 05/17/2022 17:42   IR US Guide Vasc Access Left  Result Date:  05/21/2022 INDICATION: End-stage renal disease, recent line infections, no current access, limited venous access options EXAM: ULTRASOUND GUIDANCE FOR VASCULAR ACCESS LEFT EJ VENOUS ACCESS AND PLACEMENT OF A TUNNELED DIALYSIS CATHETER. CENTRAL SVC VENOGRAM WITH 12 MM CENTRAL INNOMINATE VENOUS ANGIOPLASTY MEDICATIONS: Ancef 2 g; The antibiotic was administered within an appropriate time interval prior to skin puncture. ANESTHESIA/SEDATION: Moderate (conscious) sedation was employed during this  procedure. A total of Versed 2.0 mg and Fentanyl 100 mcg was administered intravenously by the radiology nurse. Total intra-service moderate Sedation Time: 46 minutes. The patient's level of consciousness and vital signs were monitored continuously by radiology nursing throughout the procedure under my direct supervision. FLUOROSCOPY: Radiation Exposure Index (as provided by the fluoroscopic device): 053 mGy Kerma COMPLICATIONS: None immediate. PROCEDURE: Informed written consent was obtained from the patient after a thorough discussion of the procedural risks, benefits and alternatives. All questions were addressed. Maximal Sterile Barrier Technique was utilized including caps, mask, sterile gowns, sterile gloves, sterile drape, hand hygiene and skin antiseptic. A timeout was performed prior to the initiation of the procedure. Under sterile conditions and local anesthesia, percutaneous micropuncture needle access performed of a dilated left external jugular vein in the mid supraclavicular region. 018 guidewire advanced centrally to the innominate venous confluence. Micro dilator set advanced. Guidewire access advanced into the SVC with some difficulty through the innominate SVC junction. Catheter advanced followed by placement of an Amplatz guidewire into the IVC. Measurements obtained for the appropriate length. Subcutaneous tunnel was created in the left infraclavicular chest. The 28 cm palindrome catheter was tunneled  subcutaneously to the venotomy site and attempts were made to advance the catheter into the SVC through the valve peel-away sheath. Catheter would not advanced through the innominate SVC junction suggesting central venous stenosis. Catheter eventually had to be removed with insertion of a 12 French sheath. Through the sheath, central venogram performed over Amplatz guidewires. 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 12 mm prolonged angioplasty performed of the central left innominate vein stenosis with a 12 x 4 atlas balloon. Single inflation for 1 minute at 18 atmospheres. Balloon was deflated and removed. Over double stiff Amplatz guidewire access, eventually the 28 cm palindrome catheter was advanced into the SVC RA junction. Position confirmed with fluoroscopy. Images obtained for documentation. Blood aspirated easily followed by saline and heparin flushes. Appropriate volume strength of heparin instilled in both lumens followed by external caps. Catheter secured with Prolene suture. Venotomy site closed with subcuticular Vicryl suture and Dermabond. Sterile dressing applied. No immediate complication. Patient tolerated the procedure well. IMPRESSION: Successful left external jugular tunnel HD catheter requiring 12 mm venous angioplasty of the central left innominate vein as detailed above to allow catheter placement. Tip SVC RA junction.  Ready for use. Please note it will be critical to maintain this catheter access as the patient has limited upper extremity central venous options and currently refuses use of the femoral veins for tunneled catheter access. Electronically Signed   By: Jerilynn Mages.  Shick M.D.   On: 05/21/2022 16:38   IR US Guide Vasc Access Right  Result Date: 05/20/2022 INDICATION: 66 year old woman with history of renal failure and multiple prior dialysis catheter placement and removal, most recently on 05/17/2022, presents to IR for  tunneled IJ catheter placement. EXAM: 1. Ultrasound evaluation of bilateral internal jugular veins. 2. Attempted ultrasound-guided access of right internal and external jugular veins. MEDICATIONS: Ancef 2 g IV; The antibiotic was administered within an appropriate time interval prior to skin puncture. ANESTHESIA/SEDATION: Moderate (conscious) sedation was employed during this procedure. A total of Versed 1 mg and Fentanyl 50 mcg was administered intravenously by the radiology nurse. Total intra-service moderate Sedation Time: 16 minutes. The patient's level of consciousness and vital signs were monitored continuously by radiology nursing throughout the procedure under my direct supervision. FLUOROSCOPY: Radiation Exposure Index (as provided by  the fluoroscopic device): 7 mGy Kerma COMPLICATIONS: None immediate. PROCEDURE: Informed written consent was obtained from the patient after a thorough discussion of the procedural risks, benefits and alternatives. All questions were addressed. Maximal Sterile Barrier Technique was utilized including caps, mask, sterile gowns, sterile gloves, sterile drape, hand hygiene and skin antiseptic. A timeout was performed prior to the initiation of the procedure. Patient positioned supine on the procedure table. Ultrasound evaluation of the left neck demonstrates thrombosed left internal jugular vein. Ultrasound evaluation of the right neck demonstrates partial thrombosis of the right internal jugular vein. The external jugular vein was patent. Sterile ultrasound probe cover and gel utilized throughout the procedure. Using continuous ultrasound guidance, the right internal jugular vein was accessed with a 21 gauge needle. However guidewire could not be advanced centrally beyond the clavicle. Using continuous ultrasound guidance, the right external jugular vein was accessed with a 21 gauge needle. However guidewire could not be advanced centrally beyond the clavicle. Venogram could not  be performed due to patient's dye allergy. Right neck accesses were removed and hemostasis achieved with manual compression. IMPRESSION: 1. Thrombosed left internal jugular vein. 2. Partially thrombosed right internal jugular vein. 3. Both right internal and external jugular veins were accessed with 21 gauge needles, however guidewire could not be advanced beyond the clavicle. 4. Venogram could not be performed due to patient's dye allergy. PLAN: 1. IV dye allergy prep will be ordered. 2. Patient will be brought back tomorrow to once again attempt tunneled dialysis catheter placement and venogram if necessary. Electronically Signed   By: Miachel Roux M.D.   On: 05/20/2022 17:04   DG Chest Port 1 View  Result Date: 05/16/2022 CLINICAL DATA:  Short of breath,  end-stage renal disease EXAM: PORTABLE CHEST 1 VIEW COMPARISON:  None Available. FINDINGS: LEFT-sided central venous line tip in the distal SVC. Stable cardiac silhouette. Mild central venous congestion pattern. Low lung volumes. IMPRESSION: Central venous congestion.  No overt pulmonary edema. Electronically Signed   By: Suzy Bouchard M.D.   On: 05/16/2022 14:58   DG Chest Port 1 View  Result Date: 05/02/2022 CLINICAL DATA:  Chest pain. EXAM: PORTABLE CHEST 1 VIEW COMPARISON:  Chest x-ray 07/17/2021. FINDINGS: Left-sided central venous catheter tip projects over the distal SVC. The heart is mildly enlarged, unchanged. There is minimal left basilar atelectasis. There is no pleural effusion or pneumothorax. No acute fractures are seen. The heart size and mediastinal contours are within normal limits. Both lungs are clear. The visualized skeletal structures are unremarkable. IMPRESSION: No active disease.  Stable cardiomegaly. Electronically Signed   By: Ronney Asters M.D.   On: 05/02/2022 00:06   IR PTA VENOUS EXCEPT DIALYSIS CIRCUIT  Result Date: 05/21/2022 INDICATION: End-stage renal disease, recent line infections, no current access, limited  venous access options EXAM: ULTRASOUND GUIDANCE FOR VASCULAR ACCESS LEFT EJ VENOUS ACCESS AND PLACEMENT OF A TUNNELED DIALYSIS CATHETER. CENTRAL SVC VENOGRAM WITH 12 MM CENTRAL INNOMINATE VENOUS ANGIOPLASTY MEDICATIONS: Ancef 2 g; The antibiotic was administered within an appropriate time interval prior to skin puncture. ANESTHESIA/SEDATION: Moderate (conscious) sedation was employed during this procedure. A total of Versed 2.0 mg and Fentanyl 100 mcg was administered intravenously by the radiology nurse. Total intra-service moderate Sedation Time: 46 minutes. The patient's level of consciousness and vital signs were monitored continuously by radiology nursing throughout the procedure under my direct supervision. FLUOROSCOPY: Radiation Exposure Index (as provided by the fluoroscopic device): 885 mGy Kerma COMPLICATIONS: None immediate. PROCEDURE: Informed written consent was  obtained from the patient after a thorough discussion of the procedural risks, benefits and alternatives. All questions were addressed. Maximal Sterile Barrier Technique was utilized including caps, mask, sterile gowns, sterile gloves, sterile drape, hand hygiene and skin antiseptic. A timeout was performed prior to the initiation of the procedure. Under sterile conditions and local anesthesia, percutaneous micropuncture needle access performed of a dilated left external jugular vein in the mid supraclavicular region. 018 guidewire advanced centrally to the innominate venous confluence. Micro dilator set advanced. Guidewire access advanced into the SVC with some difficulty through the innominate SVC junction. Catheter advanced followed by placement of an Amplatz guidewire into the IVC. Measurements obtained for the appropriate length. Subcutaneous tunnel was created in the left infraclavicular chest. The 28 cm palindrome catheter was tunneled subcutaneously to the venotomy site and attempts were made to advance the catheter into the SVC through  the valve peel-away sheath. Catheter would not advanced through the innominate SVC junction suggesting central venous stenosis. Catheter eventually had to be removed with insertion of a 12 French sheath. Through the sheath, central venogram performed over Amplatz guidewires. 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 12 mm prolonged angioplasty performed of the central left innominate vein stenosis with a 12 x 4 atlas balloon. Single inflation for 1 minute at 18 atmospheres. Balloon was deflated and removed. Over double stiff Amplatz guidewire access, eventually the 28 cm palindrome catheter was advanced into the SVC RA junction. Position confirmed with fluoroscopy. Images obtained for documentation. Blood aspirated easily followed by saline and heparin flushes. Appropriate volume strength of heparin instilled in both lumens followed by external caps. Catheter secured with Prolene suture. Venotomy site closed with subcuticular Vicryl suture and Dermabond. Sterile dressing applied. No immediate complication. Patient tolerated the procedure well. IMPRESSION: Successful left external jugular tunnel HD catheter requiring 12 mm venous angioplasty of the central left innominate vein as detailed above to allow catheter placement. Tip SVC RA junction.  Ready for use. Please note it will be critical to maintain this catheter access as the patient has limited upper extremity central venous options and currently refuses use of the femoral veins for tunneled catheter access. Electronically Signed   By: Jerilynn Mages.  Shick M.D.   On: 05/21/2022 16:38   ECHOCARDIOGRAM COMPLETE  Result Date: 05/19/2022    ECHOCARDIOGRAM REPORT   Patient Name:   [facility] Date of Exam: 05/18/2022 Medical Rec #:  [id number]            Height:       62.0 in Accession #:    [id number]           Weight:       198.2 lb Date of Birth:  11/21/1956             BSA:          1.904 m Patient Age:     8 years             BP:           109/71 mmHg Patient Gender: F                    HR:           95 bpm. Exam Location:  Inpatient Procedure: 2D Echo, Cardiac Doppler and Color Doppler Indications:     Endocarditis  History:         Patient has prior history of Echocardiogram  examinations, most                  recent 07/17/2021. CHF, CAD, COPD; Risk Factors:Hypertension and                  Diabetes.  Sonographer:     Merrie Roof RDCS Referring Phys:  [place] Diagnosing Phys: Vernell Leep MD IMPRESSIONS  1. Left ventricular ejection fraction, by estimation, is 70 to 75%. The left ventricle has hyperdynamic function. The left ventricle has no regional wall motion abnormalities. There is moderate left ventricular hypertrophy. Left ventricular diastolic parameters are consistent with Grade I diastolic dysfunction (impaired relaxation).  2. Right ventricular systolic function is normal. The right ventricular size is normal.  3. No vegetation seen. The mitral valve is abnormal. No evidence of mitral valve regurgitation. No evidence of mitral stenosis. Moderate mitral annular calcification. No vegetation seen.  4. Increased transvalvular gradient (max 42 mmHg) likely a combination of LVOT obstruction and possibly mild aortic valvular stenosis.     No vegetation seen.. The aortic valve is tricuspid. There is mild calcification of the aortic valve. Aortic valve regurgitation is not visualized. No vegetation seen  5. No signifcaitn change compared to outpatient study in 03/2022. FINDINGS  Left Ventricle: Left ventricular ejection fraction, by estimation, is 70 to 75%. The left ventricle has hyperdynamic function. The left ventricle has no regional wall motion abnormalities. The left ventricular internal cavity size was small. There is moderate left ventricular hypertrophy. Left ventricular diastolic parameters are consistent with Grade I diastolic dysfunction (impaired relaxation). Right Ventricle: The  right ventricular size is normal. No increase in right ventricular wall thickness. Right ventricular systolic function is normal. Left Atrium: Left atrial size was normal in size. Right Atrium: Right atrial size was not assessed. Pericardium: There is no evidence of pericardial effusion. Mitral Valve: No vegetation seen. The mitral valve is abnormal. Moderate mitral annular calcification. No evidence of mitral valve regurgitation. No evidence of mitral valve stenosis. MV peak gradient, 21.9 mmHg. The mean mitral valve gradient is 9.0 mmHg. Tricuspid Valve: No vegetation seen. The tricuspid valve is normal in structure. Tricuspid valve regurgitation is not demonstrated. No evidence of tricuspid stenosis. Aortic Valve: Increased transvalvular gradient (max 42 mmHg) likely a combination of LVOT obstruction and possibly mild aortic valvular stenosis. No vegetation seen. The aortic valve is tricuspid. There is mild calcification of the aortic valve. Aortic valve regurgitation is not visualized. Pulmonic Valve: The pulmonic valve was grossly normal. Pulmonic valve regurgitation is mild. Aorta: The aortic root is normal in size and structure. Pulmonary Artery: No vegetation seen. IAS/Shunts: The interatrial septum was not assessed.  LEFT VENTRICLE PLAX 2D LVIDd:         3.50 cm   Diastology LVIDs:         2.10 cm   LV e' medial:    6.53 cm/s LV PW:         1.50 cm   LV E/e' medial:  15.3 LV IVS:        1.30 cm   LV e' lateral:   8.27 cm/s LVOT diam:     1.80 cm   LV E/e' lateral: 12.1 LV SV:         75 LV SV Index:   39 LVOT Area:     2.54 cm  RIGHT VENTRICLE RV S prime:     13.40 cm/s TAPSE (M-mode): 1.9 cm LEFT ATRIUM  Index LA diam:        3.90 cm 2.05 cm/m LA Vol (A2C):   49.0 ml 25.73 ml/m LA Vol (A4C):   62.8 ml 32.98 ml/m LA Biplane Vol: 60.5 ml 31.77 ml/m  AORTIC VALVE LVOT Vmax:   142.00 cm/s LVOT Vmean:  106.000 cm/s LVOT VTI:    0.295 m  AORTA Ao Root diam: 3.00 cm MITRAL VALVE MV Area (PHT):  3.46 cm     SHUNTS MV Area VTI:   1.51 cm     Systemic VTI:  0.30 m MV Peak grad:  21.9 mmHg    Systemic Diam: 1.80 cm MV Mean grad:  9.0 mmHg MV Vmax:       2.34 m/s MV Vmean:      139.0 cm/s MV Decel Time: 219 msec MV E velocity: 100.00 cm/s MV A velocity: 180.00 cm/s MV E/A ratio:  0.56 Manish Patwardhan MD Electronically signed by Vernell Leep MD Signature Date/Time: 05/19/2022/12:10:53 PM    Final    DG Hip Unilat W or Wo Pelvis 2-3 Views Left  Result Date: 04/29/2022 CLINICAL DATA:  Left-sided pain EXAM: DG HIP (WITH OR WITHOUT PELVIS) 2-3V LEFT COMPARISON:  None. FINDINGS: SI joints are non widened. Pubic symphysis is intact. There are vascular calcifications. No fracture or malalignment. IMPRESSION: No acute osseous abnormality. Electronically Signed   By: Donavan Foil M.D.   On: 04/29/2022 18:45   IR TUNNELED CENTRAL VENOUS CATHETER PLACEMENT  Result Date: 05/21/2022 INDICATION: End-stage renal disease, recent line infections, no current access, limited venous access options EXAM: ULTRASOUND GUIDANCE FOR VASCULAR ACCESS LEFT EJ VENOUS ACCESS AND PLACEMENT OF A TUNNELED DIALYSIS CATHETER. CENTRAL SVC VENOGRAM WITH 12 MM CENTRAL INNOMINATE VENOUS ANGIOPLASTY MEDICATIONS: Ancef 2 g; The antibiotic was administered within an appropriate time interval prior to skin puncture. ANESTHESIA/SEDATION: Moderate (conscious) sedation was employed during this procedure. A total of Versed 2.0 mg and Fentanyl 100 mcg was administered intravenously by the radiology nurse. Total intra-service moderate Sedation Time: 46 minutes. The patient's level of consciousness and vital signs were monitored continuously by radiology nursing throughout the procedure under my direct supervision. FLUOROSCOPY: Radiation Exposure Index (as provided by the fluoroscopic device): 237 mGy Kerma COMPLICATIONS: None immediate. PROCEDURE: Informed written consent was obtained from the patient after a thorough discussion of the  procedural risks, benefits and alternatives. All questions were addressed. Maximal Sterile Barrier Technique was utilized including caps, mask, sterile gowns, sterile gloves, sterile drape, hand hygiene and skin antiseptic. A timeout was performed prior to the initiation of the procedure. Under sterile conditions and local anesthesia, percutaneous micropuncture needle access performed of a dilated left external jugular vein in the mid supraclavicular region. 018 guidewire advanced centrally to the innominate venous confluence. Micro dilator set advanced. Guidewire access advanced into the SVC with some difficulty through the innominate SVC junction. Catheter advanced followed by placement of an Amplatz guidewire into the IVC. Measurements obtained for the appropriate length. Subcutaneous tunnel was created in the left infraclavicular chest. The 28 cm palindrome catheter was tunneled subcutaneously to the venotomy site and attempts were made to advance the catheter into the SVC through the valve peel-away sheath. Catheter would not advanced through the innominate SVC junction suggesting central venous stenosis. Catheter eventually had to be removed with insertion of a 12 French sheath. Through the sheath, central venogram performed over Amplatz guidewires. 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 12 mm prolonged angioplasty performed of the  central left innominate vein stenosis with a 12 x 4 atlas balloon. Single inflation for 1 minute at 18 atmospheres. Balloon was deflated and removed. Over double stiff Amplatz guidewire access, eventually the 28 cm palindrome catheter was advanced into the SVC RA junction. Position confirmed with fluoroscopy. Images obtained for documentation. Blood aspirated easily followed by saline and heparin flushes. Appropriate volume strength of heparin instilled in both lumens followed by external caps. Catheter secured with  Prolene suture. Venotomy site closed with subcuticular Vicryl suture and Dermabond. Sterile dressing applied. No immediate complication. Patient tolerated the procedure well. IMPRESSION: Successful left external jugular tunnel HD catheter requiring 12 mm venous angioplasty of the central left innominate vein as detailed above to allow catheter placement. Tip SVC RA junction.  Ready for use. Please note it will be critical to maintain this catheter access as the patient has limited upper extremity central venous options and currently refuses use of the femoral veins for tunneled catheter access. Electronically Signed   By: Jerilynn Mages.  Shick M.D.   On: 05/21/2022 16:38    Today   Subjective    Whitney Walker today has no headache,no chest abdominal pain,no new weakness tingling or numbness, feels much better wants to go home today.     Objective   Blood pressure (!) 145/78, pulse 94, temperature 98.3 F (36.8 C), temperature source Oral, resp. rate 16, height '5\' 2"'$  (1.575 m), weight 92.2 kg, SpO2 94 %.   Intake/Output Summary (Last 24 hours) at 05/22/2022 1049 Last data filed at 05/22/2022 0050 Gross per 24 hour  Intake --  Output 1000 ml  Net -1000 ml    Exam  Awake Alert, No new F.N deficits,    [place].AT,PERRAL Supple Neck,   Symmetrical Chest wall movement, Good air movement bilaterally, CTAB RRR,No Gallops,   +ve B.Sounds, Abd Soft, Non tender,  No Cyanosis, Clubbing or edema    Data Review   Recent Labs  Lab 05/18/22 0433 05/19/22 2007 05/20/22 0131 05/21/22 0100 05/22/22 0204  WBC 16.9* 14.7* 15.3* 13.0* 20.6*  HGB 9.8* 9.5* 9.1* 9.4* 9.6*  HCT 31.3* 29.9* 29.9* 30.7* 28.7*  PLT 250 276 267 286 299  MCV 95.4 94.6 96.8 95.9 90.0  MCH 29.9 30.1 29.4 29.4 30.1  MCHC 31.3 31.8 30.4 30.6 33.4  RDW 15.7* 14.9 15.2 14.9 14.5  LYMPHSABS  --   --  2.0 1.4 1.0  MONOABS  --   --  1.3* 0.7 0.5  EOSABS  --   --  0.4 0.4 0.0  BASOSABS  --   --  0.0 0.0 0.0    Recent Labs  Lab  05/16/22 1407 05/16/22 1740 05/17/22 0047 05/18/22 0433 05/19/22 2007 05/20/22 0131  NA 134*  --  130* 135 131* 134*  K 5.1  --  5.4* 5.0 4.2 4.6  CL 101  --  100 102 100 103  CO2 20*  --  15* 20* 18* 19*  GLUCOSE 122*  --  111* 100* 145* 122*  BUN 41*  --  47* 24* 36* 36*  CREATININE 8.21*  --  9.04* 5.49* 9.05* 9.12*  CALCIUM 8.8*  --  8.6* 8.4* 8.5* 8.7*  AST 21  --  25  --   --   --   ALT 16  --  14  --   --   --   ALKPHOS 109  --  89  --   --   --   BILITOT 0.6  --  0.8  --   --   --  ALBUMIN 2.5*  --  2.0* 2.0* 2.1* 2.0*  PHOS  --   --   --  2.9 3.8 3.9  LATICACIDVEN  --  1.1 1.1  --   --   --   INR 1.2  --   --   --   --   --     Total Time in preparing paper work, data evaluation and todays exam - 35 minutes  Lala Lund M.D on 05/22/2022 at 10:49 AM  [facility]

## 2022-05-22 NOTE — Progress Notes (Signed)
[place] for Heparin Indication: DVT  Allergies  Allergen Reactions   Carnosine     Other reaction(s): Unknown   Gadolinium Derivatives Hives and Other (See Comments)    HIVES, Desc: HIVES W/ "DYE" USED FOR 1ST CT SCAN BUT NOT 2ND, NO PREMEDS USED, PT UNCERTAIN OF CIRCUMSTANCES,,?POSSIBLE MRI CONTRAST ALLERGY, ALL STUDIES DONE "SOMEWHERE" IN [place]//A.C., Onset Date: [id number]   Iohexol Hives and Other (See Comments)    Desc: HIVES W/ "DYE" USED FOR 1ST CT SCAN BUT NOT 2ND, NO PREMEDS USED, PT UNCERTAIN OF CIRCUMSTANCES,,?POSSIBLE MRI CONTRAST ALLERGY, ALL STUDIES DONE "SOMEWHERE" IN [place]//A.C., Onset Date: [id number]    Iodinated Contrast Media Hives   Iodine Hives   Naltrexone Other (See Comments)    Unknown    Patient Measurements: Height: '5\' 2"'$  (157.5 cm) Weight: 92.2 kg (203 lb 4.2 oz) IBW/kg (Calculated) : 50.1 Heparin Dosing Weight: 72 kg  Vital Signs: Temp: 97.7 F (36.5 C) (05/24 0600) Temp Source: Oral (05/24 0600) BP: 143/96 (05/24 0600) Pulse Rate: 92 (05/24 0600)  Labs: Recent Labs    05/19/22 2007 05/20/22 0131 05/20/22 0131 05/21/22 0100 05/21/22 0909 05/22/22 0204 05/22/22 0436  HGB 9.5* 9.1*  --  9.4*  --  9.6*  --   HCT 29.9* 29.9*  --  30.7*  --  28.7*  --   PLT 276 267  --  286  --  299  --   APTT  --   --    < > 40* 39* 172* 126*  HEPARINUNFRC  --   --   --  0.89*  --  1.00*  --   CREATININE 9.05* 9.12*  --   --   --   --   --    < > = values in this interval not displayed.    Estimated Creatinine Clearance: 6.4 mL/min (A) (by C-G formula based on SCr of 9.12 mg/dL (H)).  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 Last dose Eliquis 2.'5mg'$  taken 5/22 0820. Pharmacy consulted to manage heparin.   Pt went to IR for [facility] placment and IJ thrombosis found. Eliquis affecting heparin levels so will utilize aPTT until levels  correlate. Hgb 9.1, plt wnl.  aPTT from 5/25 @ 0200 was drawn while heparin gtt was still infusing. Repeat aPTT still above goal at 126 on heparin 1400 units/hr. No signs of bleeding or issues with heparin infusion reported.  Goal of Therapy:  Heparin level 0.3-0.7 units/ml; aPTT 66-102 sec Monitor platelets by anticoagulation protocol: Yes   Plan:  Decrease heparin infusion to 1300 units/hr Check heparin level in 8 hours and daily while on heparin Continue to monitor H&H and platelets    Thank you for allowing pharmacy to be a part of this patient's care.  Ardyth Harps, PharmD Clinical Pharmacist

## 2022-05-22 NOTE — Discharge Instructions (Signed)
Follow with Primary MD [name], FNP in 7 days   Get CBC, CMP, 2 view Chest X ray -  checked next visit within 1 week by Primary MD    Activity: As tolerated with Full fall precautions use walker/cane & assistance as needed  Disposition Home    Diet: Renal diet with 1.2 L fluid restriction per day.  Special Instructions: If you have smoked or chewed Tobacco  in the last 2 yrs please stop smoking, stop any regular Alcohol  and or any Recreational drug use.  On your next visit with your primary care physician please Get Medicines reviewed and adjusted.  Please request your Prim.MD to go over all Hospital Tests and Procedure/Radiological results at the follow up, please get all Hospital records sent to your Prim MD by signing hospital release before you go home.  If you experience worsening of your admission symptoms, develop shortness of breath, life threatening emergency, suicidal or homicidal thoughts you must seek medical attention immediately by calling 911 or calling your MD immediately  if symptoms less severe.  You Must read complete instructions/literature along with all the possible adverse reactions/side effects for all the Medicines you take and that have been prescribed to you. Take any new Medicines after you have completely understood and accpet all the possible adverse reactions/side effects.

## 2022-05-22 NOTE — Progress Notes (Signed)
Physical Therapy Treatment Patient Details Name: Whitney Walker MRN: [id number] DOB: Aug 06, 1956 Today's Date: 05/22/2022   History of Present Illness Pt is a 66 y.o. female admitted 5/18 with diagnosis of hypotension and fall. PMH: ESRD on HD MWF, COPD, CAD, DVT on chronic anticoagulation, HTN, OA with chronic left hip pain, depression, history of PE.    PT Comments    Patient eager to ambulate and actually met PT at the door, however was holding IV pole and did not feel safe--requested use of RW and provided. Patient reporting pain in wrists and hands and educated to lighten her pressure through hands, especially since left gluteal muscles feeling better. She did not feel ready to attempt without a device. Reviewed gluteal stretches as pt was doing reps far too quickly. Patient reports she plans to discharge home today after dialysis and is ready from a mobility perspective.     Recommendations for follow up therapy are one component of a multi-disciplinary discharge planning process, led by the attending physician.  Recommendations may be updated based on patient status, additional functional criteria and insurance authorization.  Follow Up Recommendations  Home health PT (pt has goal to walk without device)     Assistance Recommended at Discharge PRN  Patient can return home with the following Assistance with cooking/housework;Help with stairs or ramp for entrance   Equipment Recommendations  None recommended by PT    Recommendations for Other Services       Precautions / Restrictions Precautions Precautions: Fall;Other (comment) Precaution Comments: watch BP Restrictions Weight Bearing Restrictions: No     Mobility  Bed Mobility                    Transfers                   General transfer comment: met pt standing at door with IV pole; wanting to go for a walk but wanting to use the RW not push the IV pole    Ambulation/Gait Ambulation/Gait  assistance: Supervision Gait Distance (Feet): 400 Feet Assistive device: Rolling walker (2 wheels) Gait Pattern/deviations: Step-through pattern, Decreased stride length Gait velocity: decreased     General Gait Details: vc for lightening pressure through bil UEs to decr strain on wrists (especially as left glutes feeling better)   Stairs             Wheelchair Mobility    Modified Rankin (Stroke Patients Only)       Balance Overall balance assessment: Needs assistance Sitting-balance support: No upper extremity supported, Feet supported Sitting balance-Leahy Scale: Good     Standing balance support: Bilateral upper extremity supported Standing balance-Leahy Scale: Fair Standing balance comment: reliant on RW for dynamic balance, fair statically                            Cognition Arousal/Alertness: Awake/alert Behavior During Therapy: WFL for tasks assessed/performed Overall Cognitive Status: Within Functional Limits for tasks assessed                                          Exercises Other Exercises Other Exercises: Reviewed gentle hip ROM into flexion to stretch glutes (pt demonstrated quick repetitions with cues to slow down and hold stretch).    General Comments General comments (skin integrity, edema, etc.): Pt reporting she plans  to get down into bathtub to bathe. Advised against this as getting in/out of tub will likely be difficult with left glute pain. Offered pt should do "sponge bath" and she is adamant that she will not be clean with that. Notified OT to try to further address with pt.      Pertinent Vitals/Pain Pain Assessment Pain Assessment: 0-10 Faces Pain Scale: Hurts little more Pain Location: L posterior hip Pain Descriptors / Indicators: Discomfort, Sore Pain Intervention(s): Limited activity within patient's tolerance    Home Living                          Prior Function            PT  Goals (current goals can now be found in the care plan section) Acute Rehab PT Goals Patient Stated Goal: manage L hip pain Time For Goal Achievement: 05/31/22 Potential to Achieve Goals: Good Progress towards PT goals: Progressing toward goals    Frequency    Min 3X/week      PT Plan Current plan remains appropriate    Co-evaluation              AM-PAC PT "6 Clicks" Mobility   Outcome Measure  Help needed turning from your back to your side while in a flat bed without using bedrails?: None Help needed moving from lying on your back to sitting on the side of a flat bed without using bedrails?: None Help needed moving to and from a bed to a chair (including a wheelchair)?: None Help needed standing up from a chair using your arms (e.g., wheelchair or bedside chair)?: None Help needed to walk in hospital room?: A Little Help needed climbing 3-5 steps with a railing? : A Little 6 Click Score: 22    End of Session   Activity Tolerance: Patient tolerated treatment well Patient left: with call bell/phone within reach;in chair Nurse Communication: Mobility status PT Visit Diagnosis: Difficulty in walking, not elsewhere classified (R26.2)     Time: 6759-1638 PT Time Calculation (min) (ACUTE ONLY): 18 min  Charges:  $Gait Training: 8-22 mins                      Arby Barrette, PT Acute Rehabilitation Services  Pager [phone] Office [phone]    Rexanne Mano 05/22/2022, 10:13 AM

## 2022-05-22 NOTE — Progress Notes (Addendum)
Pt for d/c to home today. Pt received HD late yesterday and today is pt's regular scheduled HD day. Contacted renal PA to inquire if pt would appropriate to receive out-pt HD at clinic tomorrow to avoid HD on day of d/c. Renal PA agreeable to treatment out-pt tomorrow if clinic has availability. Contacted [facility] and spoke to [place], Agricultural consultant. Clinic can provide treatment tomorrow. Pt will need to arrive at 5:30 for 5:45 chair time. Made renal PA and inpt HD unit RN aware of this info. Met with pt at bedside. Pt agreeable to treatment tomorrow at clinic and states she will need transportation arranged. Pt uses medicaid transportation. Navigator offered to assist with arranging transport. [place] Medicaid transportation and spoke to [facility]. Transportation appt made for tomorrow out-pt HD appt. Transportation to call pt to make her aware of transportation arrival time. Clinic, [facility], aware pt agreeable to treatment tomorrow and that transportation has been arranged. Met with pt at bedside again to provide update regarding that transportation has been scheduled. Pt is wanting to speak to renal PA regarding orders for HD on Thursday and Friday. PA made aware of pt's request. HD appt for tomorrow added to pt's AVS. Update provided to attending, pt's RN, and TOC via secure chat. [facility]bigail [facility] at [facility] advised pt to need iv cefazolin with HD and that renal PA to send orders. [facility] confirms clinic has med in-stock.   Melven Sartorius Renal Navigator [phone]

## 2022-05-22 NOTE — Progress Notes (Signed)
[facility] Progress Note   Subjective:   Patient seen and examined at bedside.  Initially resistant to having HD today d/t having yesterday and she thought it was Tuesday.  Agreed to HD today since it is her normal day.  Per patient no issues with HD yesterday using new TDC.  Denies CP, SOB, abdominal pain and n/v/d.  States she is going home today.   Objective Vitals:   05/22/22 0123 05/22/22 0125 05/22/22 0600 05/22/22 0800  BP:  (!) 168/98 (!) 143/96 (!) 145/78  Pulse:  98 92 94  Resp:  '19 18 16  '$ Temp:  (!) 96.9 F (36.1 C) 97.7 F (36.5 C) 98.3 F (36.8 C)  TempSrc:  Oral Oral Oral  SpO2:  97% 98% 94%  Weight: 92.2 kg     Height:       Physical Exam General:chronically ill appearing female in NAD Heart:RRR, no mrg Lungs:CTAB, nml WOB on RA Abdomen:soft, NTND Extremities:No LE edema Dialysis Access: [facility]   Filed Weights   05/21/22 2100 05/22/22 0050 05/22/22 0123  Weight: 95 kg 94 kg 92.2 kg    Intake/Output Summary (Last 24 hours) at 05/22/2022 1001 Last data filed at 05/22/2022 0050 Gross per 24 hour  Intake --  Output 1000 ml  Net -1000 ml    Additional Objective Labs: Basic Metabolic Panel: Recent Labs  Lab 05/18/22 0433 05/19/22 2007 05/20/22 0131  NA 135 131* 134*  K 5.0 4.2 4.6  CL 102 100 103  CO2 20* 18* 19*  GLUCOSE 100* 145* 122*  BUN 24* 36* 36*  CREATININE 5.49* 9.05* 9.12*  CALCIUM 8.4* 8.5* 8.7*  PHOS 2.9 3.8 3.9   Liver Function Tests: Recent Labs  Lab 05/16/22 1407 05/17/22 0047 05/18/22 0433 05/19/22 2007 05/20/22 0131  AST 21 25  --   --   --   ALT 16 14  --   --   --   ALKPHOS 109 89  --   --   --   BILITOT 0.6 0.8  --   --   --   PROT 7.4 6.6  --   --   --   ALBUMIN 2.5* 2.0* 2.0* 2.1* 2.0*   CBC: Recent Labs  Lab 05/18/22 0433 05/19/22 2007 05/20/22 0131 05/21/22 0100 05/22/22 0204  WBC 16.9* 14.7* 15.3* 13.0* 20.6*  NEUTROABS  --   --  11.4* 10.4* 18.8*  HGB 9.8* 9.5* 9.1* 9.4* 9.6*  HCT  31.3* 29.9* 29.9* 30.7* 28.7*  MCV 95.4 94.6 96.8 95.9 90.0  PLT 250 276 267 286 299   Blood Culture    Component Value Date/Time   SDES BLOOD LEFT HAND 05/18/2022 0445   SPECREQUEST  05/18/2022 0445    AEROBIC BOTTLE ONLY Blood Culture results may not be optimal due to an inadequate volume of blood received in culture bottles   CULT  05/18/2022 0445    NO GROWTH 4 DAYS Performed at [facility], [address] [place], [place] [address]    REPTSTATUS PENDING 05/18/2022 0445   CBG: Recent Labs  Lab 05/16/22 1410  GLUCAP 110*    Studies/Results: IR Venocavagram Svc  Result Date: 05/21/2022 INDICATION: End-stage renal disease, recent line infections, no current access, limited venous access options EXAM: ULTRASOUND GUIDANCE FOR VASCULAR ACCESS LEFT EJ VENOUS ACCESS AND PLACEMENT OF A TUNNELED DIALYSIS CATHETER. CENTRAL SVC VENOGRAM WITH 12 MM CENTRAL INNOMINATE VENOUS ANGIOPLASTY MEDICATIONS: Ancef 2 g; The antibiotic was administered within an appropriate  time interval prior to skin puncture. ANESTHESIA/SEDATION: Moderate (conscious) sedation was employed during this procedure. A total of Versed 2.0 mg and Fentanyl 100 mcg was administered intravenously by the radiology nurse. Total intra-service moderate Sedation Time: 46 minutes. The patient's level of consciousness and vital signs were monitored continuously by radiology nursing throughout the procedure under my direct supervision. FLUOROSCOPY: Radiation Exposure Index (as provided by the fluoroscopic device): 976 mGy Kerma COMPLICATIONS: None immediate. PROCEDURE: Informed written consent was obtained from the patient after a thorough discussion of the procedural risks, benefits and alternatives. All questions were addressed. Maximal Sterile Barrier Technique was utilized including caps, mask, sterile gowns, sterile gloves, sterile drape, hand hygiene and skin antiseptic. A timeout was performed prior to the initiation of the  procedure. Under sterile conditions and local anesthesia, percutaneous micropuncture needle access performed of a dilated left external jugular vein in the mid supraclavicular region. 018 guidewire advanced centrally to the innominate venous confluence. Micro dilator set advanced. Guidewire access advanced into the SVC with some difficulty through the innominate SVC junction. Catheter advanced followed by placement of an Amplatz guidewire into the IVC. Measurements obtained for the appropriate length. Subcutaneous tunnel was created in the left infraclavicular chest. The 28 cm palindrome catheter was tunneled subcutaneously to the venotomy site and attempts were made to advance the catheter into the SVC through the valve peel-away sheath. Catheter would not advanced through the innominate SVC junction suggesting central venous stenosis. Catheter eventually had to be removed with insertion of a 12 French sheath. Through the sheath, central venogram performed over Amplatz guidewires. 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 12 mm prolonged angioplasty performed of the central left innominate vein stenosis with a 12 x 4 atlas balloon. Single inflation for 1 minute at 18 atmospheres. Balloon was deflated and removed. Over double stiff Amplatz guidewire access, eventually the 28 cm palindrome catheter was advanced into the SVC RA junction. Position confirmed with fluoroscopy. Images obtained for documentation. Blood aspirated easily followed by saline and heparin flushes. Appropriate volume strength of heparin instilled in both lumens followed by external caps. Catheter secured with Prolene suture. Venotomy site closed with subcuticular Vicryl suture and Dermabond. Sterile dressing applied. No immediate complication. Patient tolerated the procedure well. IMPRESSION: Successful left external jugular tunnel HD catheter requiring 12 mm venous angioplasty of the  central left innominate vein as detailed above to allow catheter placement. Tip SVC RA junction.  Ready for use. Please note it will be critical to maintain this catheter access as the patient has limited upper extremity central venous options and currently refuses use of the femoral veins for tunneled catheter access. Electronically Signed   By: Jerilynn Mages.  Shick M.D.   On: 05/21/2022 16:38   IR Fluoro Guide CV Line Right  Result Date: 05/20/2022 INDICATION: 66 year old woman with history of renal failure and multiple prior dialysis catheter placement and removal, most recently on 05/17/2022, presents to IR for tunneled IJ catheter placement. EXAM: 1. Ultrasound evaluation of bilateral internal jugular veins. 2. Attempted ultrasound-guided access of right internal and external jugular veins. MEDICATIONS: Ancef 2 g IV; The antibiotic was administered within an appropriate time interval prior to skin puncture. ANESTHESIA/SEDATION: Moderate (conscious) sedation was employed during this procedure. A total of Versed 1 mg and Fentanyl 50 mcg was administered intravenously by the radiology nurse. Total intra-service moderate Sedation Time: 16 minutes. The patient's level of consciousness and vital signs were monitored continuously by radiology nursing  throughout the procedure under my direct supervision. FLUOROSCOPY: Radiation Exposure Index (as provided by the fluoroscopic device): 7 mGy Kerma COMPLICATIONS: None immediate. PROCEDURE: Informed written consent was obtained from the patient after a thorough discussion of the procedural risks, benefits and alternatives. All questions were addressed. Maximal Sterile Barrier Technique was utilized including caps, mask, sterile gowns, sterile gloves, sterile drape, hand hygiene and skin antiseptic. A timeout was performed prior to the initiation of the procedure. Patient positioned supine on the procedure table. Ultrasound evaluation of the left neck demonstrates thrombosed left  internal jugular vein. Ultrasound evaluation of the right neck demonstrates partial thrombosis of the right internal jugular vein. The external jugular vein was patent. Sterile ultrasound probe cover and gel utilized throughout the procedure. Using continuous ultrasound guidance, the right internal jugular vein was accessed with a 21 gauge needle. However guidewire could not be advanced centrally beyond the clavicle. Using continuous ultrasound guidance, the right external jugular vein was accessed with a 21 gauge needle. However guidewire could not be advanced centrally beyond the clavicle. Venogram could not be performed due to patient's dye allergy. Right neck accesses were removed and hemostasis achieved with manual compression. IMPRESSION: 1. Thrombosed left internal jugular vein. 2. Partially thrombosed right internal jugular vein. 3. Both right internal and external jugular veins were accessed with 21 gauge needles, however guidewire could not be advanced beyond the clavicle. 4. Venogram could not be performed due to patient's dye allergy. PLAN: 1. IV dye allergy prep will be ordered. 2. Patient will be brought back tomorrow to once again attempt tunneled dialysis catheter placement and venogram if necessary. Electronically Signed   By: Miachel Roux M.D.   On: 05/20/2022 17:04   IR US Guide Vasc Access Left  Result Date: 05/21/2022 INDICATION: End-stage renal disease, recent line infections, no current access, limited venous access options EXAM: ULTRASOUND GUIDANCE FOR VASCULAR ACCESS LEFT EJ VENOUS ACCESS AND PLACEMENT OF A TUNNELED DIALYSIS CATHETER. CENTRAL SVC VENOGRAM WITH 12 MM CENTRAL INNOMINATE VENOUS ANGIOPLASTY MEDICATIONS: Ancef 2 g; The antibiotic was administered within an appropriate time interval prior to skin puncture. ANESTHESIA/SEDATION: Moderate (conscious) sedation was employed during this procedure. A total of Versed 2.0 mg and Fentanyl 100 mcg was administered intravenously by the  radiology nurse. Total intra-service moderate Sedation Time: 46 minutes. The patient's level of consciousness and vital signs were monitored continuously by radiology nursing throughout the procedure under my direct supervision. FLUOROSCOPY: Radiation Exposure Index (as provided by the fluoroscopic device): 425 mGy Kerma COMPLICATIONS: None immediate. PROCEDURE: Informed written consent was obtained from the patient after a thorough discussion of the procedural risks, benefits and alternatives. All questions were addressed. Maximal Sterile Barrier Technique was utilized including caps, mask, sterile gowns, sterile gloves, sterile drape, hand hygiene and skin antiseptic. A timeout was performed prior to the initiation of the procedure. Under sterile conditions and local anesthesia, percutaneous micropuncture needle access performed of a dilated left external jugular vein in the mid supraclavicular region. 018 guidewire advanced centrally to the innominate venous confluence. Micro dilator set advanced. Guidewire access advanced into the SVC with some difficulty through the innominate SVC junction. Catheter advanced followed by placement of an Amplatz guidewire into the IVC. Measurements obtained for the appropriate length. Subcutaneous tunnel was created in the left infraclavicular chest. The 28 cm palindrome catheter was tunneled subcutaneously to the venotomy site and attempts were made to advance the catheter into the SVC through the valve peel-away sheath. Catheter would not advanced through the innominate  SVC junction suggesting central venous stenosis. Catheter eventually had to be removed with insertion of a 12 French sheath. Through the sheath, central venogram performed over Amplatz guidewires. 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 12 mm prolonged angioplasty performed of the central left innominate vein stenosis with a 12 x 4 atlas balloon. Single  inflation for 1 minute at 18 atmospheres. Balloon was deflated and removed. Over double stiff Amplatz guidewire access, eventually the 28 cm palindrome catheter was advanced into the SVC RA junction. Position confirmed with fluoroscopy. Images obtained for documentation. Blood aspirated easily followed by saline and heparin flushes. Appropriate volume strength of heparin instilled in both lumens followed by external caps. Catheter secured with Prolene suture. Venotomy site closed with subcuticular Vicryl suture and Dermabond. Sterile dressing applied. No immediate complication. Patient tolerated the procedure well. IMPRESSION: Successful left external jugular tunnel HD catheter requiring 12 mm venous angioplasty of the central left innominate vein as detailed above to allow catheter placement. Tip SVC RA junction.  Ready for use. Please note it will be critical to maintain this catheter access as the patient has limited upper extremity central venous options and currently refuses use of the femoral veins for tunneled catheter access. Electronically Signed   By: Jerilynn Mages.  Shick M.D.   On: 05/21/2022 16:38   IR US Guide Vasc Access Right  Result Date: 05/20/2022 INDICATION: 66 year old woman with history of renal failure and multiple prior dialysis catheter placement and removal, most recently on 05/17/2022, presents to IR for tunneled IJ catheter placement. EXAM: 1. Ultrasound evaluation of bilateral internal jugular veins. 2. Attempted ultrasound-guided access of right internal and external jugular veins. MEDICATIONS: Ancef 2 g IV; The antibiotic was administered within an appropriate time interval prior to skin puncture. ANESTHESIA/SEDATION: Moderate (conscious) sedation was employed during this procedure. A total of Versed 1 mg and Fentanyl 50 mcg was administered intravenously by the radiology nurse. Total intra-service moderate Sedation Time: 16 minutes. The patient's level of consciousness and vital signs were  monitored continuously by radiology nursing throughout the procedure under my direct supervision. FLUOROSCOPY: Radiation Exposure Index (as provided by the fluoroscopic device): 7 mGy Kerma COMPLICATIONS: None immediate. PROCEDURE: Informed written consent was obtained from the patient after a thorough discussion of the procedural risks, benefits and alternatives. All questions were addressed. Maximal Sterile Barrier Technique was utilized including caps, mask, sterile gowns, sterile gloves, sterile drape, hand hygiene and skin antiseptic. A timeout was performed prior to the initiation of the procedure. Patient positioned supine on the procedure table. Ultrasound evaluation of the left neck demonstrates thrombosed left internal jugular vein. Ultrasound evaluation of the right neck demonstrates partial thrombosis of the right internal jugular vein. The external jugular vein was patent. Sterile ultrasound probe cover and gel utilized throughout the procedure. Using continuous ultrasound guidance, the right internal jugular vein was accessed with a 21 gauge needle. However guidewire could not be advanced centrally beyond the clavicle. Using continuous ultrasound guidance, the right external jugular vein was accessed with a 21 gauge needle. However guidewire could not be advanced centrally beyond the clavicle. Venogram could not be performed due to patient's dye allergy. Right neck accesses were removed and hemostasis achieved with manual compression. IMPRESSION: 1. Thrombosed left internal jugular vein. 2. Partially thrombosed right internal jugular vein. 3. Both right internal and external jugular veins were accessed with 21 gauge needles, however guidewire could not be advanced beyond the clavicle. 4. Venogram could not be  performed due to patient's dye allergy. PLAN: 1. IV dye allergy prep will be ordered. 2. Patient will be brought back tomorrow to once again attempt tunneled dialysis catheter placement and  venogram if necessary. Electronically Signed   By: Miachel Roux M.D.   On: 05/20/2022 17:04   IR PTA VENOUS EXCEPT DIALYSIS CIRCUIT  Result Date: 05/21/2022 INDICATION: End-stage renal disease, recent line infections, no current access, limited venous access options EXAM: ULTRASOUND GUIDANCE FOR VASCULAR ACCESS LEFT EJ VENOUS ACCESS AND PLACEMENT OF A TUNNELED DIALYSIS CATHETER. CENTRAL SVC VENOGRAM WITH 12 MM CENTRAL INNOMINATE VENOUS ANGIOPLASTY MEDICATIONS: Ancef 2 g; The antibiotic was administered within an appropriate time interval prior to skin puncture. ANESTHESIA/SEDATION: Moderate (conscious) sedation was employed during this procedure. A total of Versed 2.0 mg and Fentanyl 100 mcg was administered intravenously by the radiology nurse. Total intra-service moderate Sedation Time: 46 minutes. The patient's level of consciousness and vital signs were monitored continuously by radiology nursing throughout the procedure under my direct supervision. FLUOROSCOPY: Radiation Exposure Index (as provided by the fluoroscopic device): 622 mGy Kerma COMPLICATIONS: None immediate. PROCEDURE: Informed written consent was obtained from the patient after a thorough discussion of the procedural risks, benefits and alternatives. All questions were addressed. Maximal Sterile Barrier Technique was utilized including caps, mask, sterile gowns, sterile gloves, sterile drape, hand hygiene and skin antiseptic. A timeout was performed prior to the initiation of the procedure. Under sterile conditions and local anesthesia, percutaneous micropuncture needle access performed of a dilated left external jugular vein in the mid supraclavicular region. 018 guidewire advanced centrally to the innominate venous confluence. Micro dilator set advanced. Guidewire access advanced into the SVC with some difficulty through the innominate SVC junction. Catheter advanced followed by placement of an Amplatz guidewire into the IVC. Measurements  obtained for the appropriate length. Subcutaneous tunnel was created in the left infraclavicular chest. The 28 cm palindrome catheter was tunneled subcutaneously to the venotomy site and attempts were made to advance the catheter into the SVC through the valve peel-away sheath. Catheter would not advanced through the innominate SVC junction suggesting central venous stenosis. Catheter eventually had to be removed with insertion of a 12 French sheath. Through the sheath, central venogram performed over Amplatz guidewires. 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 12 mm prolonged angioplasty performed of the central left innominate vein stenosis with a 12 x 4 atlas balloon. Single inflation for 1 minute at 18 atmospheres. Balloon was deflated and removed. Over double stiff Amplatz guidewire access, eventually the 28 cm palindrome catheter was advanced into the SVC RA junction. Position confirmed with fluoroscopy. Images obtained for documentation. Blood aspirated easily followed by saline and heparin flushes. Appropriate volume strength of heparin instilled in both lumens followed by external caps. Catheter secured with Prolene suture. Venotomy site closed with subcuticular Vicryl suture and Dermabond. Sterile dressing applied. No immediate complication. Patient tolerated the procedure well. IMPRESSION: Successful left external jugular tunnel HD catheter requiring 12 mm venous angioplasty of the central left innominate vein as detailed above to allow catheter placement. Tip SVC RA junction.  Ready for use. Please note it will be critical to maintain this catheter access as the patient has limited upper extremity central venous options and currently refuses use of the femoral veins for tunneled catheter access. Electronically Signed   By: Jerilynn Mages.  Shick M.D.   On: 05/21/2022 16:38   IR TUNNELED CENTRAL VENOUS CATHETER PLACEMENT  Result Date:  05/21/2022  INDICATION: End-stage renal disease, recent line infections, no current access, limited venous access options EXAM: ULTRASOUND GUIDANCE FOR VASCULAR ACCESS LEFT EJ VENOUS ACCESS AND PLACEMENT OF A TUNNELED DIALYSIS CATHETER. CENTRAL SVC VENOGRAM WITH 12 MM CENTRAL INNOMINATE VENOUS ANGIOPLASTY MEDICATIONS: Ancef 2 g; The antibiotic was administered within an appropriate time interval prior to skin puncture. ANESTHESIA/SEDATION: Moderate (conscious) sedation was employed during this procedure. A total of Versed 2.0 mg and Fentanyl 100 mcg was administered intravenously by the radiology nurse. Total intra-service moderate Sedation Time: 46 minutes. The patient's level of consciousness and vital signs were monitored continuously by radiology nursing throughout the procedure under my direct supervision. FLUOROSCOPY: Radiation Exposure Index (as provided by the fluoroscopic device): 893 mGy Kerma COMPLICATIONS: None immediate. PROCEDURE: Informed written consent was obtained from the patient after a thorough discussion of the procedural risks, benefits and alternatives. All questions were addressed. Maximal Sterile Barrier Technique was utilized including caps, mask, sterile gowns, sterile gloves, sterile drape, hand hygiene and skin antiseptic. A timeout was performed prior to the initiation of the procedure. Under sterile conditions and local anesthesia, percutaneous micropuncture needle access performed of a dilated left external jugular vein in the mid supraclavicular region. 018 guidewire advanced centrally to the innominate venous confluence. Micro dilator set advanced. Guidewire access advanced into the SVC with some difficulty through the innominate SVC junction. Catheter advanced followed by placement of an Amplatz guidewire into the IVC. Measurements obtained for the appropriate length. Subcutaneous tunnel was created in the left infraclavicular chest. The 28 cm palindrome catheter was tunneled  subcutaneously to the venotomy site and attempts were made to advance the catheter into the SVC through the valve peel-away sheath. Catheter would not advanced through the innominate SVC junction suggesting central venous stenosis. Catheter eventually had to be removed with insertion of a 12 French sheath. Through the sheath, central venogram performed over Amplatz guidewires. 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 12 mm prolonged angioplasty performed of the central left innominate vein stenosis with a 12 x 4 atlas balloon. Single inflation for 1 minute at 18 atmospheres. Balloon was deflated and removed. Over double stiff Amplatz guidewire access, eventually the 28 cm palindrome catheter was advanced into the SVC RA junction. Position confirmed with fluoroscopy. Images obtained for documentation. Blood aspirated easily followed by saline and heparin flushes. Appropriate volume strength of heparin instilled in both lumens followed by external caps. Catheter secured with Prolene suture. Venotomy site closed with subcuticular Vicryl suture and Dermabond. Sterile dressing applied. No immediate complication. Patient tolerated the procedure well. IMPRESSION: Successful left external jugular tunnel HD catheter requiring 12 mm venous angioplasty of the central left innominate vein as detailed above to allow catheter placement. Tip SVC RA junction.  Ready for use. Please note it will be critical to maintain this catheter access as the patient has limited upper extremity central venous options and currently refuses use of the femoral veins for tunneled catheter access. Electronically Signed   By: Jerilynn Mages.  Shick M.D.   On: 05/21/2022 16:38    Medications:   ceFAZolin (ANCEF) IV 1 g (05/22/22 0421)   heparin 1,300 Units/hr (05/22/22 0703)    aspirin  81 mg Oral Daily   atorvastatin  10 mg Oral QPM   Chlorhexidine Gluconate Cloth  6 each Topical Q0600    colchicine  0.6 mg Oral Daily   darbepoetin (ARANESP) injection - DIALYSIS  60 mcg Intravenous Q Mon-HD   diclofenac Sodium  2 g Topical QID   ferric citrate  840 mg Oral TID WC   gabapentin  300 mg Oral Daily   midodrine  5 mg Oral Q M,W,F-HD   pantoprazole  40 mg Oral Daily    Dialysis Orders: [facility]  3.5h  92kg    400/1.5  2/2.5 bath  [facility] TDC  Hep 6000 - last HD 5/17 93.8 > 91.6 kg - last Hb 10.6 on 5/17 - hectorol 5 mcg IV tiw   Assessment/Plan: MSSA bacteremia - w/ fall, gen'd weakness, chills. Repeat culture w/NGTD.  Getting IV cefazolin 2g qHD until 06/29/22. Had [facility] in chest, suspected HD cath infection.  IR removed TDC for line holiday after HD Friday 5/19. TDC replaced yesterday by IR see below.  Complication placing TDC - IR Attempted to replace [facility] 5/22 but unable to be completed d/t patient having thrombosis in b/l IJ.  Started on heparin and contrast prophylaxis d/t contrast allergy.  On 5/23 IR had venogram with "successful left external jugular tunnel HD catheter requiring 12 mm venous angioplasty of the central left innominate vein.  Critical to maintain this catheter access as the patient has limited upper extremity central venous options and currently refuses use of the femoral veins for tunneled catheter access" ESRD - on HD MWF.  HD yesterday off schedule.  Plan for shortened HD today per regular schedule.  BP/vol - BP variable. Appears euvolemic on exam and is below her prior EDW.  Refuses to lower dry weight. UF to 92kg as tolerated.   L hip pain - Seen by ortho & IR.  Not enough fluid to obtain a sample to r/o septic arthritis but believe it is unlikely with nml ROM. Possible bursitis? Per PMD/ortho Anemia esrd - Hb 9.6.  Order Aranesp 79mg with HD today. MBD ckd - Calcium and phosphorus controlled. Continue binders and hectorol.  Hyperkalemia - Resolved post HD. K+ tends to run high outpatient, discussed importance of strict low K. K 4.6 today.  Nutrition - Renal  diet w/fluid restrictions.  Recent NSTEMI - No CP Dispo - ok for d/c from renal standpoint after dialysis today   .  LJen Mow PA-C [facility] 05/22/2022,10:01 AM  LOS: 6 days

## 2022-05-23 ENCOUNTER — Telehealth: Payer: Self-pay

## 2022-05-23 ENCOUNTER — Encounter: Payer: Medicare Other | Admitting: Nurse Practitioner

## 2022-05-23 DIAGNOSIS — N2581 Secondary hyperparathyroidism of renal origin: Secondary | ICD-10-CM | POA: Diagnosis not present

## 2022-05-23 DIAGNOSIS — D689 Coagulation defect, unspecified: Secondary | ICD-10-CM | POA: Diagnosis not present

## 2022-05-23 DIAGNOSIS — D631 Anemia in chronic kidney disease: Secondary | ICD-10-CM | POA: Diagnosis not present

## 2022-05-23 DIAGNOSIS — N186 End stage renal disease: Secondary | ICD-10-CM | POA: Diagnosis not present

## 2022-05-23 DIAGNOSIS — R7881 Bacteremia: Secondary | ICD-10-CM | POA: Diagnosis not present

## 2022-05-23 DIAGNOSIS — Z992 Dependence on renal dialysis: Secondary | ICD-10-CM | POA: Diagnosis not present

## 2022-05-23 LAB — CULTURE, BLOOD (ROUTINE X 2)
Culture: NO GROWTH
Culture: NO GROWTH

## 2022-05-23 NOTE — Telephone Encounter (Signed)
Transition Care Management Follow-up Telephone Call Date of discharge and from where: 05/22/2022 [place] How have you been since you were released from the hospital?  Pt states she feels a lot better, not in as much pain.  Any questions or concerns? No  Items Reviewed: Did the pt receive and understand the discharge instructions provided? Yes  Medications obtained and verified? Yes  Other? Yes  Any new allergies since your discharge? No  Dietary orders reviewed? Yes Do you have support at home? No   Home Care and Equipment/Supplies: Were home health services ordered? no If so, what is the name of the agency? N/a  Has the agency set up a time to come to the patient's home? no Were any new equipment or medical supplies ordered?  No What is the name of the medical supply agency? N/a Were you able to get the supplies/equipment? no Do you have any questions related to the use of the equipment or supplies? No  Functional Questionnaire: (I = Independent and D = Dependent) ADLs: I/d  Bathing/Dressing- I/d  Meal Prep- I/d  Eating- I/d  Maintaining continence- I/d  Transferring/Ambulation- I/d  Managing Meds- I/d  Follow up appointments reviewed:  PCP Hospital f/u appt confirmed? No  Scheduled to see n/a on n/a @ n/a. [facility] f/u appt confirmed? No  Scheduled to see n/a on n/a @ n/a. Are transportation arrangements needed? No  If their condition worsens, is the pt aware to call PCP or go to the Emergency Dept.? Yes Was the patient provided with contact information for the PCP's office or ED? Yes Was to pt encouraged to call back with questions or concerns? Yes

## 2022-05-23 NOTE — Chronic Care Management (AMB) (Cosign Needed Addendum)
Chronic Care Management Pharmacy Assistant   Name: Amunique Neyra  MRN: [id number] DOB: 10-31-1956  Reason for Encounter: Medication Review/ Medication coordination  Recent office visits:  None  Recent consult visits:  05-16-2022 Truitt Merle, MD (Oncology). No changes.  05-14-2022 Alethia Berthold, PA-C (Cardiology). Orders placed for PCV CAROTID DUPLEX (BILATERAL). EKG completed.  05-02-2022 [name], MD (Cardiology). LEFT HEART CATH AND CORONARY ANGIOGRAPHY procedure.  Hospital visits:  Medication Reconciliation was completed by comparing discharge summary, patient's EMR and Pharmacy list, and upon discussion with patient.  Admitted to the hospital on 05-16-2022 due to a fall. Discharge date was 05-22-2022. Discharged from [place]?Medications Started at [facility] Discharge:?? Tylenol 650 mg every 8 hours as needed Ancef inject 50 mLs into vein daily for 8 days Colchicine 0.6 mg daily Voltaren apply 2 grams 4 times daily  Medication Changes at Hospital Discharge: None  Medications Discontinued at Hospital Discharge: Percocet Flexeril  Medications that remain the same after Hospital Discharge:??  -All other medications will remain the same.    Medications: Outpatient Encounter Medications as of 05/23/2022  Medication Sig Note   acetaminophen (TYLENOL) 325 MG tablet Take 2 tablets (650 mg total) by mouth every 8 (eight) hours as needed.    albuterol (VENTOLIN HFA) 108 (90 Base) MCG/ACT inhaler Inhale 2 puffs into the lungs every 6 (six) hours as needed for wheezing or shortness of breath.    apixaban (ELIQUIS) 5 MG TABS tablet Take 1 tablet (5 mg total) by mouth 2 (two) times daily.    Ascorbic Acid (VITAMIN C) 100 MG tablet Take 100 mg by mouth daily.    aspirin 81 MG chewable tablet Chew 1 tablet (81 mg total) by mouth daily.    atorvastatin (LIPITOR) 10 MG tablet TAKE ONE TABLET BY MOUTH ONCE DAILY (Patient taking differently: Take 10 mg  by mouth every evening.)    AURYXIA 1 GM 210 MG(Fe) tablet Take 420-840 mg by mouth See admin instructions. Take '840mg'$  by mouth three times daily with meals and '420mg'$  with snacks.    Biotin 10000 MCG TABS Take 10,000 mcg by mouth daily.     ceFAZolin (ANCEF) 1-4 GM/50ML-% SOLN Inject 50 mLs (1 g total) into the vein daily for 8 days.    colchicine 0.6 MG tablet Take 1 tablet (0.6 mg total) by mouth daily.    diclofenac Sodium (VOLTAREN) 1 % GEL Apply 2 g topically 4 (four) times daily.    gabapentin (NEURONTIN) 300 MG capsule Take 300 mg by mouth daily. 05/16/2022: Pharmacy record says 1 capsule 3 times a day   meclizine (ANTIVERT) 12.5 MG tablet Take 1 tablet (12.5 mg total) by mouth 3 (three) times daily as needed for dizziness.    metoprolol succinate (TOPROL-XL) 25 MG 24 hr tablet Take 1 tablet (25 mg total) by mouth daily.    midodrine (PROAMATINE) 10 MG tablet Take 5 mg by mouth every Monday, Wednesday, and Friday with hemodialysis.    multivitamin (RENA-VIT) TABS tablet Take 1 tablet by mouth every evening.    omeprazole (PRILOSEC) 20 MG capsule Take 1 capsule (20 mg total) by mouth daily.    vitamin E 200 UNIT capsule Take 200 Units by mouth daily.    No facility-administered encounter medications on file as of 05/23/2022.  Reviewed chart for medication changes ahead of medication coordination call.   BP Readings from Last 3 Encounters:  05/22/22 105/63  05/16/22 (!) 80/62  05/14/22 118/65  Lab Results  Component Value Date   HGBA1C 5.9 (H) 11/15/2021     Patient obtains medications through [facility]  90 Days   Last adherence delivery included:  Omeprazole 20 mg daily  Patient declined meds for 05-09-2022 delivery: Atorvastatin 10 mg- Patient states she has 3 bottles of medication because she stopped taking medication. Patient wasn't home to check for days supply.  Patient is due for next adherence delivery on: 06-04-2022  Called patient and reviewed medications and  coordinated delivery.  This delivery to include: Omeprazole 20 mg daily Metoprolol 25 mg daily  No acute/short fill needed  Patient declined the following medications: None  Patient needs refills for: None  Confirmed delivery date of 06-04-2022 advised patient that pharmacy will contact them the morning of delivery.  05-28-2022: Patient is on no fill report for midodrine. Patient stated she has a weeks worth left so [facility] can send it out on the 06-01. Completed acute form in [employer].  [place] Pharmacist Assistant [phone]

## 2022-05-24 ENCOUNTER — Encounter (HOSPITAL_COMMUNITY): Payer: Self-pay

## 2022-05-24 DIAGNOSIS — D631 Anemia in chronic kidney disease: Secondary | ICD-10-CM | POA: Diagnosis not present

## 2022-05-24 DIAGNOSIS — R7881 Bacteremia: Secondary | ICD-10-CM | POA: Diagnosis not present

## 2022-05-24 DIAGNOSIS — Z992 Dependence on renal dialysis: Secondary | ICD-10-CM | POA: Diagnosis not present

## 2022-05-24 DIAGNOSIS — D689 Coagulation defect, unspecified: Secondary | ICD-10-CM | POA: Diagnosis not present

## 2022-05-24 DIAGNOSIS — N2581 Secondary hyperparathyroidism of renal origin: Secondary | ICD-10-CM | POA: Diagnosis not present

## 2022-05-24 DIAGNOSIS — N186 End stage renal disease: Secondary | ICD-10-CM | POA: Diagnosis not present

## 2022-05-27 DIAGNOSIS — N2581 Secondary hyperparathyroidism of renal origin: Secondary | ICD-10-CM | POA: Diagnosis not present

## 2022-05-27 DIAGNOSIS — R7881 Bacteremia: Secondary | ICD-10-CM | POA: Diagnosis not present

## 2022-05-27 DIAGNOSIS — Z992 Dependence on renal dialysis: Secondary | ICD-10-CM | POA: Diagnosis not present

## 2022-05-27 DIAGNOSIS — D631 Anemia in chronic kidney disease: Secondary | ICD-10-CM | POA: Diagnosis not present

## 2022-05-27 DIAGNOSIS — N186 End stage renal disease: Secondary | ICD-10-CM | POA: Diagnosis not present

## 2022-05-27 DIAGNOSIS — D689 Coagulation defect, unspecified: Secondary | ICD-10-CM | POA: Diagnosis not present

## 2022-05-28 ENCOUNTER — Telehealth: Payer: Medicare Other

## 2022-05-29 DIAGNOSIS — R7881 Bacteremia: Secondary | ICD-10-CM | POA: Diagnosis not present

## 2022-05-29 DIAGNOSIS — D689 Coagulation defect, unspecified: Secondary | ICD-10-CM | POA: Diagnosis not present

## 2022-05-29 DIAGNOSIS — I129 Hypertensive chronic kidney disease with stage 1 through stage 4 chronic kidney disease, or unspecified chronic kidney disease: Secondary | ICD-10-CM | POA: Diagnosis not present

## 2022-05-29 DIAGNOSIS — D631 Anemia in chronic kidney disease: Secondary | ICD-10-CM | POA: Diagnosis not present

## 2022-05-29 DIAGNOSIS — N186 End stage renal disease: Secondary | ICD-10-CM | POA: Diagnosis not present

## 2022-05-29 DIAGNOSIS — N2581 Secondary hyperparathyroidism of renal origin: Secondary | ICD-10-CM | POA: Diagnosis not present

## 2022-05-29 DIAGNOSIS — Z992 Dependence on renal dialysis: Secondary | ICD-10-CM | POA: Diagnosis not present

## 2022-05-30 ENCOUNTER — Other Ambulatory Visit (HOSPITAL_COMMUNITY): Payer: Self-pay | Admitting: Nephrology

## 2022-05-30 ENCOUNTER — Other Ambulatory Visit: Payer: Self-pay | Admitting: Radiology

## 2022-05-30 ENCOUNTER — Telehealth: Payer: Self-pay | Admitting: Nurse Practitioner

## 2022-05-30 ENCOUNTER — Ambulatory Visit (INDEPENDENT_AMBULATORY_CARE_PROVIDER_SITE_OTHER): Payer: Medicare Other | Admitting: Nurse Practitioner

## 2022-05-30 ENCOUNTER — Encounter: Payer: Self-pay | Admitting: Nurse Practitioner

## 2022-05-30 ENCOUNTER — Telehealth: Payer: Self-pay

## 2022-05-30 VITALS — BP 110/60 | HR 81 | Temp 98.1°F | Ht 62.0 in | Wt 203.0 lb

## 2022-05-30 DIAGNOSIS — H9201 Otalgia, right ear: Secondary | ICD-10-CM

## 2022-05-30 DIAGNOSIS — T8241XD Breakdown (mechanical) of vascular dialysis catheter, subsequent encounter: Secondary | ICD-10-CM | POA: Diagnosis not present

## 2022-05-30 DIAGNOSIS — M1612 Unilateral primary osteoarthritis, left hip: Secondary | ICD-10-CM | POA: Diagnosis not present

## 2022-05-30 DIAGNOSIS — Z992 Dependence on renal dialysis: Secondary | ICD-10-CM

## 2022-05-30 DIAGNOSIS — E785 Hyperlipidemia, unspecified: Secondary | ICD-10-CM | POA: Diagnosis not present

## 2022-05-30 DIAGNOSIS — I214 Non-ST elevation (NSTEMI) myocardial infarction: Secondary | ICD-10-CM | POA: Diagnosis not present

## 2022-05-30 DIAGNOSIS — N186 End stage renal disease: Secondary | ICD-10-CM | POA: Diagnosis not present

## 2022-05-30 DIAGNOSIS — M7062 Trochanteric bursitis, left hip: Secondary | ICD-10-CM | POA: Diagnosis not present

## 2022-05-30 DIAGNOSIS — A4101 Sepsis due to Methicillin susceptible Staphylococcus aureus: Secondary | ICD-10-CM | POA: Diagnosis not present

## 2022-05-30 DIAGNOSIS — J449 Chronic obstructive pulmonary disease, unspecified: Secondary | ICD-10-CM | POA: Diagnosis not present

## 2022-05-30 DIAGNOSIS — M25552 Pain in left hip: Secondary | ICD-10-CM | POA: Diagnosis not present

## 2022-05-30 DIAGNOSIS — D509 Iron deficiency anemia, unspecified: Secondary | ICD-10-CM | POA: Diagnosis not present

## 2022-05-30 DIAGNOSIS — G2581 Restless legs syndrome: Secondary | ICD-10-CM | POA: Diagnosis not present

## 2022-05-30 DIAGNOSIS — I132 Hypertensive heart and chronic kidney disease with heart failure and with stage 5 chronic kidney disease, or end stage renal disease: Secondary | ICD-10-CM | POA: Diagnosis not present

## 2022-05-30 DIAGNOSIS — D631 Anemia in chronic kidney disease: Secondary | ICD-10-CM | POA: Diagnosis not present

## 2022-05-30 DIAGNOSIS — A411 Sepsis due to other specified staphylococcus: Secondary | ICD-10-CM | POA: Diagnosis not present

## 2022-05-30 DIAGNOSIS — T827XXA Infection and inflammatory reaction due to other cardiac and vascular devices, implants and grafts, initial encounter: Secondary | ICD-10-CM | POA: Diagnosis not present

## 2022-05-30 DIAGNOSIS — I739 Peripheral vascular disease, unspecified: Secondary | ICD-10-CM | POA: Diagnosis not present

## 2022-05-30 DIAGNOSIS — G8929 Other chronic pain: Secondary | ICD-10-CM | POA: Diagnosis not present

## 2022-05-30 DIAGNOSIS — I2510 Atherosclerotic heart disease of native coronary artery without angina pectoris: Secondary | ICD-10-CM | POA: Diagnosis not present

## 2022-05-30 DIAGNOSIS — Z452 Encounter for adjustment and management of vascular access device: Secondary | ICD-10-CM | POA: Diagnosis not present

## 2022-05-30 DIAGNOSIS — D72829 Elevated white blood cell count, unspecified: Secondary | ICD-10-CM | POA: Diagnosis not present

## 2022-05-30 DIAGNOSIS — Z792 Long term (current) use of antibiotics: Secondary | ICD-10-CM | POA: Diagnosis not present

## 2022-05-30 DIAGNOSIS — I5032 Chronic diastolic (congestive) heart failure: Secondary | ICD-10-CM | POA: Diagnosis not present

## 2022-05-30 DIAGNOSIS — I252 Old myocardial infarction: Secondary | ICD-10-CM | POA: Diagnosis not present

## 2022-05-30 DIAGNOSIS — F32A Depression, unspecified: Secondary | ICD-10-CM | POA: Diagnosis not present

## 2022-05-30 DIAGNOSIS — G4733 Obstructive sleep apnea (adult) (pediatric): Secondary | ICD-10-CM | POA: Diagnosis not present

## 2022-05-30 MED ORDER — DIPHENHYDRAMINE HCL 50 MG PO TABS: 50.0000 mg | ORAL_TABLET | Freq: Once | ORAL | 0 refills | Status: DC

## 2022-05-30 MED ORDER — PREDNISONE 50 MG PO TABS: ORAL_TABLET | ORAL | 0 refills | Status: DC

## 2022-05-30 MED ORDER — FLUTICASONE PROPIONATE 50 MCG/ACT NA SUSP: 2.0000 | Freq: Every day | NASAL | 2 refills | Status: DC

## 2022-05-30 NOTE — Telephone Encounter (Signed)
Crystal from [facility] called saying pt is having swelling in her face and neck.  The Dr. Is concerned about it being SVC syndrome.  Per [place], [place], Get pt in with an appt this Wed.  May possibly need a venogram.  We can call and ask for either Crystal or Zigmund Daniel.

## 2022-05-30 NOTE — Progress Notes (Signed)
I,Tianna Badgett,acting as a Education administrator for [employer], FNP.,have documented all relevant documentation on the behalf of [name], FNP,as directed by  [name], FNP while in the presence of [name], [place].  This visit occurred during the SARS-CoV-2 public health emergency.  Safety protocols were in place, including screening questions prior to the visit, additional usage of staff PPE, and extensive cleaning of exam room while observing appropriate contact time as indicated for disinfecting solutions.  Subjective:     Patient ID: Whitney Walker , female    DOB: 03/24/1956 , 66 y.o.   MRN: [id number]   Chief Complaint  Patient presents with   Hospitalization Follow-up    HPI  Patient  presents today for hospital follow up.   She was at the hospital last has stress fracture. She was getting gabapentin and iv medication for pain. She has fallen 3 times since being home. She reports hitting her head and reports seeing "stars" but just laid there. This morning she has been sitting longer before sitting up. She is having more vivid dreams of her children being little.   She is going to radiology in the morning to have treatment to her catheter due to infection. She also reports having blood clots to the left subclavian.   She would like assistance with light cleaning, cooking, bathing through a HHA.   She is to have dialysis tomorrow but is to go to radiology tomorrow urgently. She is having swelling to her face.    Patient wants to keep her schedule for dialysis and then go to radiology afterwards. She has spoke with Crystal.  Daughter Whitney Walker is present during visit.      Past Medical History:  Diagnosis Date   Anemia    Anxiety    CHF (congestive heart failure) (HCC)    Colon cancer (HCC)    treatment surgery   Complication of anesthesia    after first C- [place] "couldnt walk after", patient denies having a spinal   COPD (chronic obstructive pulmonary disease) ([place])     Coronary artery disease    Depression    DVT (deep venous thrombosis) (HCC)    ESRD on hemodialysis ([place])    Hemo: MWF   History of blood transfusion 04/2018   Hypertension    07/07/18- no longer takes BP medications   Meningitis    Pain in limb 07/30/2013   PE (pulmonary embolism)    Peripheral vascular disease (HCC)    Restless legs    Shortness of breath    with exertion   Sleep apnea    SOB (shortness of breath) 03/03/2019   Vertigo      Family History  Problem Relation Age of Onset   Cancer Mother 32       breast and bone   Cancer Father 56       prostate   Hypertension Sister    Bleeding Disorder Sister    Cancer Cousin 61       breast cancer    Hypertension Daughter    Breast cancer Neg Hx      Current Outpatient Medications:    fluticasone (FLONASE) 50 MCG/ACT nasal spray, Place 2 sprays into both nostrils daily., Disp: 18.2 mL, Rfl: 2   acetaminophen (TYLENOL) 325 MG tablet, Take by mouth., Disp: , Rfl:    albuterol (VENTOLIN HFA) 108 (90 Base) MCG/ACT inhaler, Inhale 2 puffs into the lungs every 6 (six) hours as needed for wheezing or shortness of breath., Disp:  18 g, Rfl: 0   amLODipine (NORVASC) 5 MG tablet, Take 1 tablet by mouth daily. (Patient not taking: Reported on 07/04/2022), Disp: , Rfl:    apixaban (ELIQUIS) 5 MG TABS tablet, Take 1 tablet (5 mg total) by mouth 2 (two) times daily., Disp: 60 tablet, Rfl: 2   Ascorbic Acid (VITAMIN C) 100 MG tablet, Take 100 mg by mouth daily., Disp: , Rfl:    aspirin 81 MG chewable tablet, Chew 1 tablet (81 mg total) by mouth daily. (Patient not taking: Reported on 07/04/2022), Disp: 30 tablet, Rfl: 0   AURYXIA 1 GM 210 MG(Fe) tablet, Take 420-840 mg by mouth See admin instructions. Take 813m by mouth three times daily with meals and 427mwith snacks., Disp: , Rfl:    Biotin 5000 MCG SUBL, Place under the tongue., Disp: , Rfl:    carvedilol (COREG) 12.5 MG tablet, Take 1 tablet by mouth 2 (two) times daily. (Patient  not taking: Reported on 07/04/2022), Disp: , Rfl:    cyclobenzaprine (FLEXERIL) 10 MG tablet, , Disp: , Rfl:    diclofenac Sodium (VOLTAREN) 1 % GEL, Apply topically., Disp: , Rfl:    diphenhydrAMINE (BANOPHEN) 25 MG tablet, TAKE TWO TABLETS (5047motal) BY MOUTH ONCE FOR ONE DOSE AT 9AM ON 6/2 (Patient not taking: Reported on 07/04/2022), Disp: , Rfl:    Doxercalciferol (HECTOROL IV), Doxercalciferol (Hectorol) (Patient not taking: Reported on 07/04/2022), Disp: , Rfl:    doxycycline (VIBRA-TABS) 100 MG tablet, TAKE ONE TABLET BY MOUTH TWICE DAILY DO not take with binders (Patient not taking: Reported on 07/04/2022), Disp: , Rfl:    ezetimibe (ZETIA) 10 MG tablet, Take 1 tablet by mouth daily. (Patient not taking: Reported on 07/04/2022), Disp: , Rfl:    gabapentin (NEURONTIN) 300 MG capsule, Take by mouth. (Patient not taking: Reported on 07/04/2022), Disp: , Rfl:    ketorolac (ACULAR) 0.5 % ophthalmic solution, instill ONE drop into LEFT EYE four times daily STARTING ONE DAY BEFORE surgery, Disp: , Rfl:    meclizine (ANTIVERT) 12.5 MG tablet, Take 1 tablet (12.5 mg total) by mouth 3 (three) times daily as needed for dizziness., Disp: 30 tablet, Rfl: 0   melatonin 3 MG TABS tablet, TAKE ONE TABLET BY MOUTH EVERYDAY AT BEDTIME AS NEEDED (Patient not taking: Reported on 07/04/2022), Disp: , Rfl:    midodrine (PROAMATINE) 10 MG tablet, Take 5 mg by mouth every Monday, Wednesday, and Friday with hemodialysis. (Patient not taking: Reported on 06/18/2022), Disp: , Rfl:    ofloxacin (OCUFLOX) 0.3 % ophthalmic solution, instill ONE drop into THE LEFT EYE four times daily STARTING one DAY BEFORE surgery, Disp: , Rfl:    omeprazole (PRILOSEC) 20 MG capsule, Take 1 capsule (20 mg total) by mouth daily., Disp: 30 capsule, Rfl: 0   predniSONE (DELTASONE) 20 MG tablet, TAKE TWO TABLETS BY MOUTH AT 7pm A DAY prior TO procedue and TAKE TWO TABLETS BY MOUTH AT 11pm (tonight) and TAKE TWO TABLETS BY MOUTH AT 7am (Patient not taking:  Reported on 07/04/2022), Disp: , Rfl:    vitamin E 200 UNIT capsule, Take 200 Units by mouth daily., Disp: , Rfl:    Allergies  Allergen Reactions   Carnosine     Other reaction(s): Unknown   Gadolinium Derivatives Hives and Other (See Comments)    HIVES, Desc: HIVES W/ "DYE" USED FOR 1ST CT SCAN BUT NOT 2ND, NO PREMEDS USED, PT UNCERTAIN OF CIRCUMSTANCES,,?POSSIBLE MRI CONTRAST ALLERGY, ALL STUDIES DONE "SOMEWHERE" IN [place]//A.C., Onset Date:  [id number]   Iohexol Hives and Other (See Comments)    Desc: HIVES W/ "DYE" USED FOR 1ST CT SCAN BUT NOT 2ND, NO PREMEDS USED, PT UNCERTAIN OF CIRCUMSTANCES,,?POSSIBLE MRI CONTRAST ALLERGY, ALL STUDIES DONE "SOMEWHERE" IN [place]//A.C., Onset Date: [id number]    Iodinated Contrast Media Hives   Iodine Hives   Naltrexone Other (See Comments)    Unknown     Review of Systems  Constitutional: Negative.   Respiratory: Negative.    Cardiovascular: Negative.   Gastrointestinal: Negative.   Musculoskeletal:        She took 2 tylenol prior to her visit today. Has been using her rollator. She had PT through [facility] today, will start next week.   Neurological: Negative.   Psychiatric/Behavioral: Negative.       Today's Vitals   05/30/22 1446  BP: 110/60  Pulse: 81  Temp: 98.1 F (36.7 C)  TempSrc: Oral  Weight: 203 lb (92.1 kg)  Height: $Remove'5\' 2"'IBUSnQe$  (1.575 m)   Body mass index is 37.13 kg/m.  Wt Readings from Last 3 Encounters:  07/04/22 204 lb (92.5 kg)  05/30/22 203 lb (92.1 kg)  05/22/22 203 lb 4.2 oz (92.2 kg)    Objective:  Physical Exam Vitals reviewed.  Constitutional:      General: She is not in acute distress.    Appearance: Normal appearance. She is obese.  HENT:     Right Ear: Ear canal and external ear normal. There is no impacted cerumen. Tympanic membrane is bulging.     Left Ear: Tympanic membrane, ear canal and external ear normal. There is no impacted cerumen.  Cardiovascular:     Rate and Rhythm: Normal rate and  regular rhythm.     Pulses: Normal pulses.     Heart sounds: Normal heart sounds. No murmur heard. Pulmonary:     Effort: Pulmonary effort is normal. No respiratory distress.     Breath sounds: Normal breath sounds. No wheezing.  Skin:    General: Skin is warm and dry.     Capillary Refill: Capillary refill takes less than 2 seconds.     Comments: Vas cath present  Neurological:     General: No focal deficit present.     Mental Status: She is alert and oriented to person, place, and time.     Cranial Nerves: No cranial nerve deficit.  Psychiatric:        Mood and Affect: Mood normal.        Behavior: Behavior normal.        Thought Content: Thought content normal.        Judgment: Judgment normal.         Assessment And Plan:     1. Sepsis due to other specified Staphylococcus [facility]) TCM Performed. A member of the clinical team spoke with the patient upon dischare. Discharge summary was reviewed in full detail during the visit. Meds reconciled and compared to discharge meds. Medication list is updated and reviewed with the patient.  Greater than 50% face to face time was spent in counseling an coordination of care.  All questions were answered to the satisfaction of the patient.   She is to have a repeat cxr. Also called interventional radiology to figure out when she needs to come for her procedure since she needs to go to dialysis.  - DG Chest 2 View; Future - BMP8+eGFR - CBC  2. Left hip pain  3. Right ear pain She has bulging TM to her right ear,  will treat with flonase - fluticasone (FLONASE) 50 MCG/ACT nasal spray; Place 2 sprays into both nostrils daily.  Dispense: 18.2 mL; Refill: 2  4. ESRD (end stage renal disease) on dialysis [facility]) She is to wait until her interventional procedure before getting her dialysis in the event the catheter is not working efficiently.   5. Non-ST elevation (NSTEMI) myocardial infarction [facility]) She is to continue aspirin for 30 days in  addition to Eliquis recommended by Cardiology   Patient was given opportunity to ask questions. Patient verbalized understanding of the plan and was able to repeat key elements of the plan. All questions were answered to their satisfaction.  [name], FNP   I, [name], FNP, have reviewed all documentation for this visit. The documentation on 05/30/22 for the exam, diagnosis, procedures, and orders are all accurate and complete.   IF YOU HAVE BEEN REFERRED TO A SPECIALIST, IT MAY TAKE 1-2 WEEKS TO SCHEDULE/PROCESS THE REFERRAL. IF YOU HAVE NOT HEARD FROM US/SPECIALIST IN TWO WEEKS, PLEASE GIVE [place] A CALL AT [phone].   THE PATIENT IS ENCOURAGED TO PRACTICE SOCIAL DISTANCING DUE TO THE COVID-19 PANDEMIC.

## 2022-05-30 NOTE — Telephone Encounter (Signed)
Patient and daughter are wanting to see if I can get her appt changed with IR due to needing dialysis. After much research and discussion with Ashleigh with [facility] who had spoke with her PA who scheduled her for Wednesday for possible venogram due to concerns about SVC due to swelling in her face however that appt has since been cancelled due to already having an appt with IR for evaluation, this appt was scheduled by Nephrology. Per IR the patient should come to her appt at 9am to have her catheter evaluated before dialysis and recommends to schedule dialysis afterwards or Saturday. I called the patient to make her aware and she plans to go to IR at 9 am instead of going to dialysis first. Unable to reach [address] [employer].

## 2022-05-31 ENCOUNTER — Ambulatory Visit (HOSPITAL_COMMUNITY)
Admission: RE | Admit: 2022-05-31 | Discharge: 2022-05-31 | Disposition: A | Discharge status: Home / Self Care | Payer: Medicare Other | Source: Ambulatory Visit | Attending: Nephrology | Admitting: Nephrology

## 2022-05-31 ENCOUNTER — Ambulatory Visit (HOSPITAL_BASED_OUTPATIENT_CLINIC_OR_DEPARTMENT_OTHER)
Admission: RE | Admit: 2022-05-31 | Discharge: 2022-05-31 | Disposition: A | Discharge status: Home / Self Care | Payer: Medicare Other | Source: Ambulatory Visit | Attending: Student | Admitting: Student

## 2022-05-31 DIAGNOSIS — Z86711 Personal history of pulmonary embolism: Secondary | ICD-10-CM

## 2022-05-31 DIAGNOSIS — Z992 Dependence on renal dialysis: Secondary | ICD-10-CM | POA: Insufficient documentation

## 2022-05-31 DIAGNOSIS — Z539 Procedure and treatment not carried out, unspecified reason: Secondary | ICD-10-CM | POA: Diagnosis not present

## 2022-05-31 DIAGNOSIS — I82C12 Acute embolism and thrombosis of left internal jugular vein: Secondary | ICD-10-CM | POA: Diagnosis not present

## 2022-05-31 DIAGNOSIS — N186 End stage renal disease: Secondary | ICD-10-CM | POA: Diagnosis not present

## 2022-05-31 LAB — BMP8+EGFR
BUN/Creatinine Ratio: 4 — ABNORMAL LOW (ref 12–28)
BUN: 30 mg/dL — ABNORMAL HIGH (ref 8–27)
CO2: 21 mmol/L (ref 20–29)
Calcium: 9.3 mg/dL (ref 8.7–10.3)
Chloride: 101 mmol/L (ref 96–106)
Creatinine, Ser: 7.91 mg/dL — ABNORMAL HIGH (ref 0.57–1.00)
Glucose: 118 mg/dL — ABNORMAL HIGH (ref 70–99)
Potassium: 4.6 mmol/L (ref 3.5–5.2)
Sodium: 139 mmol/L (ref 134–144)
eGFR: 5 mL/min/{1.73_m2} — ABNORMAL LOW (ref >59)

## 2022-05-31 LAB — CBC
Hematocrit: 31.8 % — ABNORMAL LOW (ref 34.0–46.6)
Hemoglobin: 10.3 g/dL — ABNORMAL LOW (ref 11.1–15.9)
MCH: 30.3 pg (ref 26.6–33.0)
MCHC: 32.4 g/dL (ref 31.5–35.7)
MCV: 94 fL (ref 79–97)
Platelets: 285 10*3/uL (ref 150–450)
RBC: 3.4 x10E6/uL — ABNORMAL LOW (ref 3.77–5.28)
RDW: 13.4 % (ref 11.7–15.4)
WBC: 12.3 10*3/uL — ABNORMAL HIGH (ref 3.4–10.8)

## 2022-05-31 NOTE — Progress Notes (Signed)
Left upper extremity venous duplex completed. Refer to "CV Proc" under chart review to view preliminary results.  05/31/2022 12:26 PM Kelby Aline., MHA, RVT, RDCS, RDMS

## 2022-06-01 DIAGNOSIS — N2581 Secondary hyperparathyroidism of renal origin: Secondary | ICD-10-CM | POA: Diagnosis not present

## 2022-06-01 DIAGNOSIS — N186 End stage renal disease: Secondary | ICD-10-CM | POA: Diagnosis not present

## 2022-06-01 DIAGNOSIS — Z992 Dependence on renal dialysis: Secondary | ICD-10-CM | POA: Diagnosis not present

## 2022-06-01 DIAGNOSIS — E875 Hyperkalemia: Secondary | ICD-10-CM | POA: Diagnosis not present

## 2022-06-01 DIAGNOSIS — R52 Pain, unspecified: Secondary | ICD-10-CM | POA: Diagnosis not present

## 2022-06-01 DIAGNOSIS — D689 Coagulation defect, unspecified: Secondary | ICD-10-CM | POA: Diagnosis not present

## 2022-06-01 DIAGNOSIS — R7881 Bacteremia: Secondary | ICD-10-CM | POA: Diagnosis not present

## 2022-06-03 DIAGNOSIS — N2581 Secondary hyperparathyroidism of renal origin: Secondary | ICD-10-CM | POA: Diagnosis not present

## 2022-06-03 DIAGNOSIS — D689 Coagulation defect, unspecified: Secondary | ICD-10-CM | POA: Diagnosis not present

## 2022-06-03 DIAGNOSIS — R7881 Bacteremia: Secondary | ICD-10-CM | POA: Diagnosis not present

## 2022-06-03 DIAGNOSIS — E875 Hyperkalemia: Secondary | ICD-10-CM | POA: Diagnosis not present

## 2022-06-03 DIAGNOSIS — R52 Pain, unspecified: Secondary | ICD-10-CM | POA: Diagnosis not present

## 2022-06-03 DIAGNOSIS — Z992 Dependence on renal dialysis: Secondary | ICD-10-CM | POA: Diagnosis not present

## 2022-06-03 DIAGNOSIS — N186 End stage renal disease: Secondary | ICD-10-CM | POA: Diagnosis not present

## 2022-06-04 DIAGNOSIS — M7062 Trochanteric bursitis, left hip: Secondary | ICD-10-CM | POA: Diagnosis not present

## 2022-06-04 DIAGNOSIS — Z452 Encounter for adjustment and management of vascular access device: Secondary | ICD-10-CM | POA: Diagnosis not present

## 2022-06-04 DIAGNOSIS — G2581 Restless legs syndrome: Secondary | ICD-10-CM | POA: Diagnosis not present

## 2022-06-04 DIAGNOSIS — D631 Anemia in chronic kidney disease: Secondary | ICD-10-CM | POA: Diagnosis not present

## 2022-06-04 DIAGNOSIS — I2510 Atherosclerotic heart disease of native coronary artery without angina pectoris: Secondary | ICD-10-CM | POA: Diagnosis not present

## 2022-06-04 DIAGNOSIS — I739 Peripheral vascular disease, unspecified: Secondary | ICD-10-CM | POA: Diagnosis not present

## 2022-06-04 DIAGNOSIS — D509 Iron deficiency anemia, unspecified: Secondary | ICD-10-CM | POA: Diagnosis not present

## 2022-06-04 DIAGNOSIS — Z792 Long term (current) use of antibiotics: Secondary | ICD-10-CM | POA: Diagnosis not present

## 2022-06-04 DIAGNOSIS — G8929 Other chronic pain: Secondary | ICD-10-CM | POA: Diagnosis not present

## 2022-06-04 DIAGNOSIS — M1612 Unilateral primary osteoarthritis, left hip: Secondary | ICD-10-CM | POA: Diagnosis not present

## 2022-06-04 DIAGNOSIS — J449 Chronic obstructive pulmonary disease, unspecified: Secondary | ICD-10-CM | POA: Diagnosis not present

## 2022-06-04 DIAGNOSIS — T8241XD Breakdown (mechanical) of vascular dialysis catheter, subsequent encounter: Secondary | ICD-10-CM | POA: Diagnosis not present

## 2022-06-04 DIAGNOSIS — T827XXA Infection and inflammatory reaction due to other cardiac and vascular devices, implants and grafts, initial encounter: Secondary | ICD-10-CM | POA: Diagnosis not present

## 2022-06-04 DIAGNOSIS — N186 End stage renal disease: Secondary | ICD-10-CM | POA: Diagnosis not present

## 2022-06-04 DIAGNOSIS — A4101 Sepsis due to Methicillin susceptible Staphylococcus aureus: Secondary | ICD-10-CM | POA: Diagnosis not present

## 2022-06-04 DIAGNOSIS — D72829 Elevated white blood cell count, unspecified: Secondary | ICD-10-CM | POA: Diagnosis not present

## 2022-06-04 DIAGNOSIS — G4733 Obstructive sleep apnea (adult) (pediatric): Secondary | ICD-10-CM | POA: Diagnosis not present

## 2022-06-04 DIAGNOSIS — E785 Hyperlipidemia, unspecified: Secondary | ICD-10-CM | POA: Diagnosis not present

## 2022-06-04 DIAGNOSIS — F32A Depression, unspecified: Secondary | ICD-10-CM | POA: Diagnosis not present

## 2022-06-04 DIAGNOSIS — I132 Hypertensive heart and chronic kidney disease with heart failure and with stage 5 chronic kidney disease, or end stage renal disease: Secondary | ICD-10-CM | POA: Diagnosis not present

## 2022-06-04 DIAGNOSIS — I5032 Chronic diastolic (congestive) heart failure: Secondary | ICD-10-CM | POA: Diagnosis not present

## 2022-06-04 DIAGNOSIS — I252 Old myocardial infarction: Secondary | ICD-10-CM | POA: Diagnosis not present

## 2022-06-05 DIAGNOSIS — Z992 Dependence on renal dialysis: Secondary | ICD-10-CM | POA: Diagnosis not present

## 2022-06-05 DIAGNOSIS — E875 Hyperkalemia: Secondary | ICD-10-CM | POA: Diagnosis not present

## 2022-06-05 DIAGNOSIS — N2581 Secondary hyperparathyroidism of renal origin: Secondary | ICD-10-CM | POA: Diagnosis not present

## 2022-06-05 DIAGNOSIS — R7881 Bacteremia: Secondary | ICD-10-CM | POA: Diagnosis not present

## 2022-06-05 DIAGNOSIS — N186 End stage renal disease: Secondary | ICD-10-CM | POA: Diagnosis not present

## 2022-06-05 DIAGNOSIS — R52 Pain, unspecified: Secondary | ICD-10-CM | POA: Diagnosis not present

## 2022-06-05 DIAGNOSIS — D689 Coagulation defect, unspecified: Secondary | ICD-10-CM | POA: Diagnosis not present

## 2022-06-06 ENCOUNTER — Telehealth (HOSPITAL_COMMUNITY): Payer: Self-pay

## 2022-06-06 ENCOUNTER — Other Ambulatory Visit (HOSPITAL_COMMUNITY): Payer: Self-pay

## 2022-06-06 DIAGNOSIS — I5032 Chronic diastolic (congestive) heart failure: Secondary | ICD-10-CM | POA: Diagnosis not present

## 2022-06-06 DIAGNOSIS — D509 Iron deficiency anemia, unspecified: Secondary | ICD-10-CM | POA: Diagnosis not present

## 2022-06-06 DIAGNOSIS — I2510 Atherosclerotic heart disease of native coronary artery without angina pectoris: Secondary | ICD-10-CM | POA: Diagnosis not present

## 2022-06-06 DIAGNOSIS — M7062 Trochanteric bursitis, left hip: Secondary | ICD-10-CM | POA: Diagnosis not present

## 2022-06-06 DIAGNOSIS — D631 Anemia in chronic kidney disease: Secondary | ICD-10-CM | POA: Diagnosis not present

## 2022-06-06 DIAGNOSIS — I132 Hypertensive heart and chronic kidney disease with heart failure and with stage 5 chronic kidney disease, or end stage renal disease: Secondary | ICD-10-CM | POA: Diagnosis not present

## 2022-06-06 DIAGNOSIS — G8929 Other chronic pain: Secondary | ICD-10-CM | POA: Diagnosis not present

## 2022-06-06 DIAGNOSIS — J449 Chronic obstructive pulmonary disease, unspecified: Secondary | ICD-10-CM | POA: Diagnosis not present

## 2022-06-06 DIAGNOSIS — G2581 Restless legs syndrome: Secondary | ICD-10-CM | POA: Diagnosis not present

## 2022-06-06 DIAGNOSIS — I252 Old myocardial infarction: Secondary | ICD-10-CM | POA: Diagnosis not present

## 2022-06-06 DIAGNOSIS — D72829 Elevated white blood cell count, unspecified: Secondary | ICD-10-CM | POA: Diagnosis not present

## 2022-06-06 DIAGNOSIS — I739 Peripheral vascular disease, unspecified: Secondary | ICD-10-CM | POA: Diagnosis not present

## 2022-06-06 DIAGNOSIS — E785 Hyperlipidemia, unspecified: Secondary | ICD-10-CM | POA: Diagnosis not present

## 2022-06-06 DIAGNOSIS — F32A Depression, unspecified: Secondary | ICD-10-CM | POA: Diagnosis not present

## 2022-06-06 DIAGNOSIS — G4733 Obstructive sleep apnea (adult) (pediatric): Secondary | ICD-10-CM | POA: Diagnosis not present

## 2022-06-06 DIAGNOSIS — Z792 Long term (current) use of antibiotics: Secondary | ICD-10-CM | POA: Diagnosis not present

## 2022-06-06 DIAGNOSIS — T827XXA Infection and inflammatory reaction due to other cardiac and vascular devices, implants and grafts, initial encounter: Secondary | ICD-10-CM | POA: Diagnosis not present

## 2022-06-06 DIAGNOSIS — N186 End stage renal disease: Secondary | ICD-10-CM | POA: Diagnosis not present

## 2022-06-06 DIAGNOSIS — T8241XD Breakdown (mechanical) of vascular dialysis catheter, subsequent encounter: Secondary | ICD-10-CM | POA: Diagnosis not present

## 2022-06-06 DIAGNOSIS — M1612 Unilateral primary osteoarthritis, left hip: Secondary | ICD-10-CM | POA: Diagnosis not present

## 2022-06-06 DIAGNOSIS — A4101 Sepsis due to Methicillin susceptible Staphylococcus aureus: Secondary | ICD-10-CM | POA: Diagnosis not present

## 2022-06-06 DIAGNOSIS — Z452 Encounter for adjustment and management of vascular access device: Secondary | ICD-10-CM | POA: Diagnosis not present

## 2022-06-06 NOTE — Telephone Encounter (Signed)
Pharmacy Transitions of Care Follow-up Telephone Call  Date of discharge: 05/22/22  Discharge Diagnosis: general weakness  How have you been since you were released from the hospital? Patient has already been on Eliquis 2.'5mg'$  prior to discharge. Dose was increased at discharge, Patient states Gabapentin makes her unstable so she hasn't been taking it.   Medication changes made at discharge:     START taking: acetaminophen (TYLENOL)  This replaces a similar medication. See the full medication list for instructions. diclofenac Sodium (VOLTAREN)  CHANGE how you take: Eliquis (apixaban)  STOP taking: acetaminophen 650 MG CR tablet (TYLENOL)  Replaced by a similar medication. cyclobenzaprine 10 MG tablet (FLEXERIL)  oxyCODONE-acetaminophen 5-325 MG tablet (Percocet)   Medication changes verified by the patient? Yes    Medication Accessibility:  Home Pharmacy: Not discussed   Was the patient provided with refills on discharged medications? No, patient has already been taking Eliquis 2.'5mg'$  bid  Have all prescriptions been transferred from [facility] to home pharmacy? N/A   Is the patient able to afford medications? Has insurance    Medication Review:  APIXABAN (ELIQUIS)  Apixaban 5 mg BID initiated on 05/22/22.  - Discussed importance of taking medication around the same time everyday  - Advised patient of medications to avoid (NSAIDs, ASA)  - Educated that Tylenol (acetaminophen) will be the preferred analgesic to prevent risk of bleeding  - Emphasized importance of monitoring for signs and symptoms of bleeding (abnormal bruising, prolonged bleeding, nose bleeds, bleeding from gums, discolored urine, black tarry stools)  - Advised patient to alert all providers of anticoagulation therapy prior to starting a new medication or having a procedure    Follow-up Appointments:  PCP Hospital f/u appt confirmed?  Seen by Dr. Laurance Flatten on 05/30/22  If their condition worsens, is the pt aware to  call PCP or go to the Emergency Dept.? Yes  Final Patient Assessment: Patient has had follow up and knows to get refills from PCP when needed

## 2022-06-07 DIAGNOSIS — N2581 Secondary hyperparathyroidism of renal origin: Secondary | ICD-10-CM | POA: Diagnosis not present

## 2022-06-07 DIAGNOSIS — Z992 Dependence on renal dialysis: Secondary | ICD-10-CM | POA: Diagnosis not present

## 2022-06-07 DIAGNOSIS — N186 End stage renal disease: Secondary | ICD-10-CM | POA: Diagnosis not present

## 2022-06-07 DIAGNOSIS — D689 Coagulation defect, unspecified: Secondary | ICD-10-CM | POA: Diagnosis not present

## 2022-06-07 DIAGNOSIS — R52 Pain, unspecified: Secondary | ICD-10-CM | POA: Diagnosis not present

## 2022-06-07 DIAGNOSIS — R7881 Bacteremia: Secondary | ICD-10-CM | POA: Diagnosis not present

## 2022-06-07 DIAGNOSIS — E875 Hyperkalemia: Secondary | ICD-10-CM | POA: Diagnosis not present

## 2022-06-10 DIAGNOSIS — R52 Pain, unspecified: Secondary | ICD-10-CM | POA: Diagnosis not present

## 2022-06-10 DIAGNOSIS — Z992 Dependence on renal dialysis: Secondary | ICD-10-CM | POA: Diagnosis not present

## 2022-06-10 DIAGNOSIS — D689 Coagulation defect, unspecified: Secondary | ICD-10-CM | POA: Diagnosis not present

## 2022-06-10 DIAGNOSIS — N186 End stage renal disease: Secondary | ICD-10-CM | POA: Diagnosis not present

## 2022-06-10 DIAGNOSIS — N2581 Secondary hyperparathyroidism of renal origin: Secondary | ICD-10-CM | POA: Diagnosis not present

## 2022-06-10 DIAGNOSIS — E875 Hyperkalemia: Secondary | ICD-10-CM | POA: Diagnosis not present

## 2022-06-10 DIAGNOSIS — R7881 Bacteremia: Secondary | ICD-10-CM | POA: Diagnosis not present

## 2022-06-12 DIAGNOSIS — R7881 Bacteremia: Secondary | ICD-10-CM | POA: Diagnosis not present

## 2022-06-12 DIAGNOSIS — N2581 Secondary hyperparathyroidism of renal origin: Secondary | ICD-10-CM | POA: Diagnosis not present

## 2022-06-12 DIAGNOSIS — Z992 Dependence on renal dialysis: Secondary | ICD-10-CM | POA: Diagnosis not present

## 2022-06-12 DIAGNOSIS — N186 End stage renal disease: Secondary | ICD-10-CM | POA: Diagnosis not present

## 2022-06-12 DIAGNOSIS — D689 Coagulation defect, unspecified: Secondary | ICD-10-CM | POA: Diagnosis not present

## 2022-06-12 DIAGNOSIS — E875 Hyperkalemia: Secondary | ICD-10-CM | POA: Diagnosis not present

## 2022-06-12 DIAGNOSIS — R52 Pain, unspecified: Secondary | ICD-10-CM | POA: Diagnosis not present

## 2022-06-14 DIAGNOSIS — N186 End stage renal disease: Secondary | ICD-10-CM | POA: Diagnosis not present

## 2022-06-14 DIAGNOSIS — R52 Pain, unspecified: Secondary | ICD-10-CM | POA: Diagnosis not present

## 2022-06-14 DIAGNOSIS — R7881 Bacteremia: Secondary | ICD-10-CM | POA: Diagnosis not present

## 2022-06-14 DIAGNOSIS — D689 Coagulation defect, unspecified: Secondary | ICD-10-CM | POA: Diagnosis not present

## 2022-06-14 DIAGNOSIS — N2581 Secondary hyperparathyroidism of renal origin: Secondary | ICD-10-CM | POA: Diagnosis not present

## 2022-06-14 DIAGNOSIS — Z992 Dependence on renal dialysis: Secondary | ICD-10-CM | POA: Diagnosis not present

## 2022-06-14 DIAGNOSIS — E875 Hyperkalemia: Secondary | ICD-10-CM | POA: Diagnosis not present

## 2022-06-17 DIAGNOSIS — R52 Pain, unspecified: Secondary | ICD-10-CM | POA: Diagnosis not present

## 2022-06-17 DIAGNOSIS — E875 Hyperkalemia: Secondary | ICD-10-CM | POA: Diagnosis not present

## 2022-06-17 DIAGNOSIS — N2581 Secondary hyperparathyroidism of renal origin: Secondary | ICD-10-CM | POA: Diagnosis not present

## 2022-06-17 DIAGNOSIS — D689 Coagulation defect, unspecified: Secondary | ICD-10-CM | POA: Diagnosis not present

## 2022-06-17 DIAGNOSIS — R7881 Bacteremia: Secondary | ICD-10-CM | POA: Diagnosis not present

## 2022-06-17 DIAGNOSIS — Z992 Dependence on renal dialysis: Secondary | ICD-10-CM | POA: Diagnosis not present

## 2022-06-17 DIAGNOSIS — N186 End stage renal disease: Secondary | ICD-10-CM | POA: Diagnosis not present

## 2022-06-18 ENCOUNTER — Ambulatory Visit (INDEPENDENT_AMBULATORY_CARE_PROVIDER_SITE_OTHER): Payer: Medicare Other

## 2022-06-18 ENCOUNTER — Telehealth: Payer: Medicare Other

## 2022-06-18 DIAGNOSIS — E875 Hyperkalemia: Secondary | ICD-10-CM

## 2022-06-18 DIAGNOSIS — E559 Vitamin D deficiency, unspecified: Secondary | ICD-10-CM

## 2022-06-18 DIAGNOSIS — I5032 Chronic diastolic (congestive) heart failure: Secondary | ICD-10-CM

## 2022-06-18 DIAGNOSIS — I10 Essential (primary) hypertension: Secondary | ICD-10-CM

## 2022-06-18 DIAGNOSIS — I700 Atherosclerosis of aorta: Secondary | ICD-10-CM

## 2022-06-18 DIAGNOSIS — N186 End stage renal disease: Secondary | ICD-10-CM

## 2022-06-18 DIAGNOSIS — I2510 Atherosclerotic heart disease of native coronary artery without angina pectoris: Secondary | ICD-10-CM

## 2022-06-18 NOTE — Patient Instructions (Signed)
Visit Information  Thank you for taking time to visit with me today. Please don't hesitate to contact me if I can be of assistance to you before our next scheduled telephone appointment.  Following are the goals we discussed today:  (Copy and paste patient goals from clinical care plan here)  Our next appointment is by telephone on 08/01/22 at 11:15 AM  Please call the care guide team at [phone] if you need to cancel or reschedule your appointment.   If you are experiencing a Mental Health or [place] or need someone to talk to, please call 1-800-273-TALK (toll free, 24 hour hotline)   Patient verbalizes understanding of instructions and care plan provided today and agrees to view in [place]. Active MyChart status and patient understanding of how to access instructions and care plan via MyChart confirmed with patient.     [name], RN, BSN, CCM Care Management Coordinator [place] [facility]  Direct Phone: [phone]

## 2022-06-18 NOTE — Chronic Care Management (AMB) (Signed)
Chronic Care Management   CCM RN Visit Note  06/18/2022 Name: Whitney Walker MRN: [id number] DOB: 1956-10-05  Subjective: Whitney Walker is a 66 y.o. year old female who is a primary care patient of [name], [place]. The care management team was consulted for assistance with disease management and care coordination needs.    Engaged with patient by telephone for follow up visit in response to provider referral for case management and/or care coordination services.   Consent to Services:  The patient was given information about Chronic Care Management services, agreed to services, and gave verbal consent prior to initiation of services.  Please see initial visit note for detailed documentation.   Patient agreed to services and verbal consent obtained.   Assessment: Review of patient past medical history, allergies, medications, health status, including review of consultants reports, laboratory and other test data, was performed as part of comprehensive evaluation and provision of chronic care management services.   SDOH (Social Determinants of Health) assessments and interventions performed:  Yes, no acute changes   CCM Care Plan  Allergies  Allergen Reactions   Carnosine     Other reaction(s): Unknown   Gadolinium Derivatives Hives and Other (See Comments)    HIVES, Desc: HIVES W/ "DYE" USED FOR 1ST CT SCAN BUT NOT 2ND, NO PREMEDS USED, PT UNCERTAIN OF CIRCUMSTANCES,,?POSSIBLE MRI CONTRAST ALLERGY, ALL STUDIES DONE "SOMEWHERE" IN [place]//A.C., Onset Date: [id number]   Iohexol Hives and Other (See Comments)    Desc: HIVES W/ "DYE" USED FOR 1ST CT SCAN BUT NOT 2ND, NO PREMEDS USED, PT UNCERTAIN OF CIRCUMSTANCES,,?POSSIBLE MRI CONTRAST ALLERGY, ALL STUDIES DONE "SOMEWHERE" IN [place]//[facility] Onset Date: [id number]    Iodinated Contrast Media Hives   Iodine Hives   Naltrexone Other (See Comments)    Unknown    Outpatient Encounter Medications as of 06/18/2022   Medication Sig Note   albuterol (VENTOLIN HFA) 108 (90 Base) MCG/ACT inhaler Inhale 2 puffs into the lungs every 6 (six) hours as needed for wheezing or shortness of breath.    apixaban (ELIQUIS) 5 MG TABS tablet Take 1 tablet (5 mg total) by mouth 2 (two) times daily.    Ascorbic Acid (VITAMIN C) 100 MG tablet Take 100 mg by mouth daily.    aspirin 81 MG chewable tablet Chew 1 tablet (81 mg total) by mouth daily.    atorvastatin (LIPITOR) 10 MG tablet TAKE ONE TABLET BY MOUTH ONCE DAILY (Patient taking differently: Take 10 mg by mouth every evening.)    AURYXIA 1 GM 210 MG(Fe) tablet Take 420-840 mg by mouth See admin instructions. Take '840mg'$  by mouth three times daily with meals and '420mg'$  with snacks.    Biotin 10000 MCG TABS Take 10,000 mcg by mouth daily.     diclofenac Sodium (VOLTAREN) 1 % GEL Apply 2 g topically 4 (four) times daily.    fluticasone (FLONASE) 50 MCG/ACT nasal spray Place 2 sprays into both nostrils daily.    meclizine (ANTIVERT) 12.5 MG tablet Take 1 tablet (12.5 mg total) by mouth 3 (three) times daily as needed for dizziness.    metoprolol succinate (TOPROL-XL) 25 MG 24 hr tablet Take 1 tablet (25 mg total) by mouth daily.    multivitamin (RENA-VIT) TABS tablet Take 1 tablet by mouth every evening.    omeprazole (PRILOSEC) 20 MG capsule Take 1 capsule (20 mg total) by mouth daily.    vitamin E 200 UNIT capsule Take 200 Units by mouth daily.    acetaminophen (TYLENOL) 325  MG tablet Take 2 tablets (650 mg total) by mouth every 8 (eight) hours as needed. (Patient not taking: Reported on 06/18/2022)    diphenhydrAMINE (BENADRYL) 50 MG tablet Take 1 tablet (50 mg total) by mouth once for 1 dose. Take at 9am 6/2    gabapentin (NEURONTIN) 300 MG capsule Take 300 mg by mouth daily. (Patient not taking: Reported on 06/18/2022) 05/16/2022: Pharmacy record says 1 capsule 3 times a day   midodrine (PROAMATINE) 10 MG tablet Take 5 mg by mouth every Monday, Wednesday, and Friday with  hemodialysis. (Patient not taking: Reported on 06/18/2022)    predniSONE (DELTASONE) 50 MG tablet Take 1 tab po at 9pm 6/1, Take 1 tab po at 3am 6/2, Take 1 tab po at 9am 6/2 (Patient not taking: Reported on 06/18/2022)    No facility-administered encounter medications on file as of 06/18/2022.    Patient Active Problem List   Diagnosis Date Noted   Sepsis due to other specified Staphylococcus ([place])    Fall 05/16/2022   Left hip pain 05/16/2022   Tobacco abuse 05/16/2022   Hypotension 05/16/2022   Chest pain 05/02/2022   Elevated troponin    Non-ST elevation (NSTEMI) myocardial infarction [facility])    Chest wall pain 05/01/2022   History of pulmonary embolism 05/01/2022   Protrusion of lumbar intervertebral disc 10/09/2021   Facet arthritis, degenerative, lumbar spine 10/09/2021   Acute low back pain    MSSA bacteremia    Septic shock ([place]) 07/16/2021   Nausea and vomiting    Abdominal pain    FUO (fever of unknown origin)    Polyarthritis    Polyarthralgia    Hyperkalemia, diminished renal excretion 05/28/2021   Generalized weakness 05/28/2021   Rash and nonspecific skin eruption 05/28/2021   Atypical chest pain    Coronary artery calcification seen on CAT scan    Hyperkalemia 05/09/2020   Left leg pain 05/09/2020   Restless leg syndrome 09/19/2018   OSA (obstructive sleep apnea) 04/29/2018   ESRD (end stage renal disease) ([place]) 04/29/2018   Dyspnea on exertion 03/12/2017   Morbid (severe) obesity due to excess calories ([place]) 03/12/2017   Angina pectoris ([place]) 03/02/2017   Anemia of chronic kidney failure, stage 3 (moderate) ([place]) 12/10/2016   History of colon cancer 08/12/2016   Mass of left breast on mammogram 12/10/2015   Depression 10/18/2015   Numbness and tingling of left arm and leg 01/24/2015   PVD (peripheral vascular disease) with claudication ([place]) 03/23/2014   PAD (peripheral artery disease) ([place]) 12/14/2013   Chronic diastolic heart failure ([place]) 11/15/2013    Essential hypertension 11/15/2013   Leucocytosis 04/28/2012    Conditions to be addressed/monitored: ESRD, HTN, CHF, Vitamin D deficiency, Hyperkalemia, Aortic Atherosclerosis , HLD, Vitamin D deficiency  Care Plan : RN Care Manager Plan of Care  Updates made by [name], RN since 06/18/2022 12:00 AM     Problem: No plan established for management of chronic disease states (ESRD, HTN, CHF, Vitamin D deficiency, Hyperkalemia, Aortic Atherosclerosis , HLD, Vitamin D deficiency)   Priority: High     Long-Range Goal: Development of plan of care for chronic disease management for (ESRD, HTN, CHF, Vitamin D deficiency, Hyperkalemia, Aortic Atherosclerosis , HLD, Vitamin D deficiency)   Start Date: 12/11/2021  Expected End Date: 12/11/2022  Recent Progress: On track  Priority: High  Note:   Current Barriers:  Knowledge Deficits related to plan of care for management of ESRD, HTN, CHF, Vitamin D deficiency, Hyperkalemia,  Aortic Atherosclerosis , HLD, Vitamin D deficiency    Chronic Disease Management support and education needs related to ESRD, HTN, CHF, Vitamin D deficiency, Hyperkalemia, Aortic Atherosclerosis , HLD, Vitamin D deficiency     RNCM Clinical Goal(s):  Patient will verbalize basic understanding of  ESRD, HTN, CHF, Vitamin D deficiency, Hyperkalemia, Aortic Atherosclerosis , HLD, Vitamin D deficiency   disease process and self health management plan as evidenced by patient will report having no disease exacerbations related to her chronic disease states as listed above take all medications exactly as prescribed and will call provider for medication related questions as evidenced by patient will report having no missed doses of her prescribed medications  demonstrate Ongoing health management independence as evidenced by patient will report 100% adherence to her prescribed treatment plan  continue to work with RN Care Manager to address care management and care coordination  needs related to  ESRD, HTN, CHF, Vitamin D deficiency, Hyperkalemia, Aortic Atherosclerosis , HLD, Vitamin D deficiency   as evidenced by adherence to CM Team Scheduled appointments demonstrate ongoing self health care management ability   as evidenced by    through collaboration with RN Care manager, provider, and care team.   CAD Interventions: (Status:  Condition stable.  Not addressed this visit.) Long Term Goal Assessed understanding of CAD diagnosis 1:1 collaboration with primary care provider regarding development and update of comprehensive plan of care as evidenced by provider attestation and co-signature Inter-disciplinary care team collaboration (see longitudinal plan of care) Evaluation of current treatment plan related to  self management and patient's adherence to plan as established by provider Review of patient status, including review of consultant's reports, relevant laboratory and other test results, and medications completed Reviewed medications with patient and discussed importance of medication adherence Discussed and reviewed the following Assessment/Plan per Lawerance Cruel PA with [facility]:    1. Coronary artery disease involving native coronary artery of native heart without angina pectoris  I25.10       2. Chronic diastolic heart failure (HCC)  I50.32       3. Carotid atherosclerosis, bilateral  I65.23 atorvastatin (LIPITOR) 10 MG tablet     4. Aortic atherosclerosis (HCC)  I70.0 atorvastatin (LIPITOR) 10 MG tablet          Medications Discontinued During This Encounter  Medication Reason   atorvastatin (LIPITOR) 10 MG tablet     ezetimibe (ZETIA) 10 MG tablet     lidocaine (LIDODERM) 5 %     multivitamin (RENA-VIT) TABS tablet      Meds ordered this encounter  Medications   atorvastatin (LIPITOR) 10 MG tablet      Sig: Take 1 tablet (10 mg total) by mouth daily.      Dispense:  30 tablet      Refill:  3      Pt gets 90DS     Recommendations:    Katelyn Broadnax is a 66 y.o. AAfrican-American female with history of obesity, hypertension, hyperlipidemia, tobacco use, stage IV kidney disease on dialysis, obstructive sleep apnea not on CPAP, COPD and severe peripheral arterial disease with history of left femoral-popliteal bypass surgery by Dr. Victorino Dike in 2014.  Patient is on chronic anticoagulation with Eliquis due to recurrent DVT and PE in 2014 and 2016.  Patient is followed by hematology for hypercoagulable state.  She is currently undergoing hemodialysis on a Monday/Wednesday/Friday schedule.   Patient presents for 37-monthfollow-up.  Last office visit increase carvedilol to 12.5  mg p.o. twice daily which she is tolerating well.  [name] heart rate has improved and blood pressure remains relatively well controlled.  Patient is notably not on ACE inhibitor or ARB due to renal disease.  She has had no recurrence of chest pain, palpitations, or dyspnea.   In regard to hyperlipidemia, discussed with patient that I have a low suspicion rash was related to Zetia or atorvastatin.  Shared decision was for patient to resume atorvastatin at this time.  Can consider rechallenging Zetia in the future.   Again reiterated the importance of diet and lifestyle modifications, particularly weight loss. Follow-up in 6 months, sooner if needed  Determined patient verbalizes understanding of her prescribed treatment plan Advised patient, providing education and rationale, to monitor blood pressure daily and record, calling PCP for findings outside established parameters Discussed plans with patient for ongoing care management follow up and provided patient with direct contact information for care management team  Vitamin D deficiency:  (Status:  Condition stable.  Not addressed this visit.)  Long Term Goal Evaluation of current treatment plan related to  Vitamin D deficiency , self-management and patient's adherence to plan as  established by provider. Review of patient status, including review of consultant's reports, relevant laboratory and other test results, and medications completed. Discussed plans with patient for ongoing care management follow up and provided patient with direct contact information for care management team Vit D, 25-Hydroxy 30.0 - 100.0 ng/mL 9.0 Low   9.1 Low  CM    Pain Interventions:  (Status:  Condition stable.  Not addressed this visit.) Long Term Goal Pain assessment performed Medications reviewed Reviewed provider established plan for pain management Discussed importance of adherence to all scheduled medical appointments Counseled on the importance of reporting any/all new or changed pain symptoms or management strategies to pain management provider Advised patient to report to care team affect of pain on daily activities Discussed use of relaxation techniques and/or diversional activities to assist with pain reduction (distraction, imagery, relaxation, massage, acupressure, TENS, heat, and cold application Reviewed with patient prescribed pharmacological and nonpharmacological pain relief strategies Printed educational materials related to Sciatica and exercise Discussed plans with patient for ongoing care management follow up and provided patient with direct contact information for care management team  Sepsis due to other specified Staphylococcus:  (Status:  New goal.)  Short Term Goal Evaluation of current treatment plan related to  Sepsis due to other specified Staphylococcus , self-management and patient's adherence to plan as established by provider Reviewed and discussed recent hospitalization with diagnosis of sepsis due to other specified staphylococcus secondary to her dialysis catheter  Determined patient's dialysis catheter was removed and replaced, she is receiving IV antibiotics post dialysis, patient is being followed by Infectious disease Determined patient is currently  asymptomatic, she has recently completed in home PT and reports her walking has improved, she continues to have difficulty with stair climbing  Determined patient's BP has improved, she is self monitoring at home, educated on target BP and when to notify her doctor if needed  Assessed for falls since last encounter, determined patient experienced multiple falls in her home after having light headedness  Reviewed medications with patient and discussed importance of medication adherence, determined patient has discontinued Gabapentin due to having bothersome SE Educated patient on importance of taking her time when changing positions to allow her BP time to adjust  Educated on importance of using her rollator at all times, fall precautions reviewed  Instructed patient to  contact her doctor for new concerns or symptoms suggestive of reoccurring infection promptly Reviewed scheduled/upcoming provider appointments including: next PCP follow up, Infectious disease follow up, Cardiovascular/Echo follow up Discussed plans with patient for ongoing care management follow up and provided patient with direct contact information for care management team  Patient Goals/Self-Care Activities: Take all medications as prescribed Attend all scheduled provider appointments Call pharmacy for medication refills 3-7 days in advance of running out of medications Perform all self care activities independently  Perform IADL's (shopping, preparing meals, housekeeping, managing finances) independently Call provider office for new concerns or questions   Follow Up Plan:  Telephone follow up appointment with care management team member scheduled for:  08/01/22     [name], RN, BSN, CCM Care Management Coordinator [facility]  Direct Phone: [phone]

## 2022-06-19 DIAGNOSIS — R7881 Bacteremia: Secondary | ICD-10-CM | POA: Diagnosis not present

## 2022-06-19 DIAGNOSIS — N186 End stage renal disease: Secondary | ICD-10-CM | POA: Diagnosis not present

## 2022-06-19 DIAGNOSIS — N2581 Secondary hyperparathyroidism of renal origin: Secondary | ICD-10-CM | POA: Diagnosis not present

## 2022-06-19 DIAGNOSIS — R52 Pain, unspecified: Secondary | ICD-10-CM | POA: Diagnosis not present

## 2022-06-19 DIAGNOSIS — Z992 Dependence on renal dialysis: Secondary | ICD-10-CM | POA: Diagnosis not present

## 2022-06-19 DIAGNOSIS — E875 Hyperkalemia: Secondary | ICD-10-CM | POA: Diagnosis not present

## 2022-06-19 DIAGNOSIS — D689 Coagulation defect, unspecified: Secondary | ICD-10-CM | POA: Diagnosis not present

## 2022-06-21 ENCOUNTER — Telehealth: Payer: Self-pay

## 2022-06-21 DIAGNOSIS — D689 Coagulation defect, unspecified: Secondary | ICD-10-CM | POA: Diagnosis not present

## 2022-06-21 DIAGNOSIS — R52 Pain, unspecified: Secondary | ICD-10-CM | POA: Diagnosis not present

## 2022-06-21 DIAGNOSIS — E875 Hyperkalemia: Secondary | ICD-10-CM | POA: Diagnosis not present

## 2022-06-21 DIAGNOSIS — Z992 Dependence on renal dialysis: Secondary | ICD-10-CM | POA: Diagnosis not present

## 2022-06-21 DIAGNOSIS — R7881 Bacteremia: Secondary | ICD-10-CM | POA: Diagnosis not present

## 2022-06-21 DIAGNOSIS — N186 End stage renal disease: Secondary | ICD-10-CM | POA: Diagnosis not present

## 2022-06-21 DIAGNOSIS — N2581 Secondary hyperparathyroidism of renal origin: Secondary | ICD-10-CM | POA: Diagnosis not present

## 2022-06-21 NOTE — Chronic Care Management (AMB) (Addendum)
Chronic Care Management Pharmacy Assistant   Name: Whitney Walker  MRN: [id number] DOB: [birth year]  Reason for Encounter: Medication Review/ Medication coordination   Recent office visits:  06-18-2022 Little, Claudette Stapler, RN (CCM).  05-30-2022 [name], [place]. Glucose= 118, BUN= 30, Creatinine= 7.91, eGFR= 5, BUN/Creatinine= 4. WBC= 12.3, RBC= 3.40, Hemo= 10.3, Hema= 31.8  Recent consult visits:  05-31-2022 Kelby Aline., MHA, RVT, RDCS, RDMS. Left upper extremity venous duplex completed.  Hospital visits:  Medication Reconciliation was completed by comparing discharge summary, patient's EMR and Pharmacy list, and upon discussion with patient.   Admitted to the hospital on 05-16-2022 due to a fall. Discharge date was 05-22-2022. Discharged from [place]?Medications Started at [facility] Discharge:?? Tylenol 650 mg every 8 hours as needed Ancef inject 50 mLs into vein daily for 8 days Colchicine 0.6 mg daily Voltaren apply 2 grams 4 times daily   Medication Changes at Hospital Discharge: None   Medications Discontinued at Hospital Discharge: Percocet Flexeril   Medications that remain the same after Hospital Discharge:??  -All other medications will remain the same.      Medications: Outpatient Encounter Medications as of 06/21/2022  Medication Sig Note   acetaminophen (TYLENOL) 325 MG tablet Take 2 tablets (650 mg total) by mouth every 8 (eight) hours as needed. (Patient not taking: Reported on 06/18/2022)    albuterol (VENTOLIN HFA) 108 (90 Base) MCG/ACT inhaler Inhale 2 puffs into the lungs every 6 (six) hours as needed for wheezing or shortness of breath.    apixaban (ELIQUIS) 5 MG TABS tablet Take 1 tablet (5 mg total) by mouth 2 (two) times daily.    Ascorbic Acid (VITAMIN C) 100 MG tablet Take 100 mg by mouth daily.    aspirin 81 MG chewable tablet Chew 1 tablet (81 mg total) by mouth daily.    atorvastatin (LIPITOR) 10 MG tablet TAKE ONE  TABLET BY MOUTH ONCE DAILY (Patient taking differently: Take 10 mg by mouth every evening.)    AURYXIA 1 GM 210 MG(Fe) tablet Take 420-840 mg by mouth See admin instructions. Take 82m by mouth three times daily with meals and 4229mwith snacks.    Biotin 10000 MCG TABS Take 10,000 mcg by mouth daily.     diclofenac Sodium (VOLTAREN) 1 % GEL Apply 2 g topically 4 (four) times daily.    diphenhydrAMINE (BENADRYL) 50 MG tablet Take 1 tablet (50 mg total) by mouth once for 1 dose. Take at 9am 6/2    fluticasone (FLONASE) 50 MCG/ACT nasal spray Place 2 sprays into both nostrils daily.    gabapentin (NEURONTIN) 300 MG capsule Take 300 mg by mouth daily. (Patient not taking: Reported on 06/18/2022) 05/16/2022: Pharmacy record says 1 capsule 3 times a day   meclizine (ANTIVERT) 12.5 MG tablet Take 1 tablet (12.5 mg total) by mouth 3 (three) times daily as needed for dizziness.    metoprolol succinate (TOPROL-XL) 25 MG 24 hr tablet Take 1 tablet (25 mg total) by mouth daily.    midodrine (PROAMATINE) 10 MG tablet Take 5 mg by mouth every Monday, Wednesday, and Friday with hemodialysis. (Patient not taking: Reported on 06/18/2022)    multivitamin (RENA-VIT) TABS tablet Take 1 tablet by mouth every evening.    omeprazole (PRILOSEC) 20 MG capsule Take 1 capsule (20 mg total) by mouth daily.    predniSONE (DELTASONE) 50 MG tablet Take 1 tab po at 9pm 6/1, Take 1 tab po at 3am 6/2,  Take 1 tab po at 9am 6/2 (Patient not taking: Reported on 06/18/2022)    vitamin E 200 UNIT capsule Take 200 Units by mouth daily.    No facility-administered encounter medications on file as of 06/21/2022.  Reviewed chart for medication changes ahead of medication coordination call.  No medication changes indicated   BP Readings from Last 3 Encounters:  05/30/22 110/60  05/22/22 105/63  05/16/22 (!) 80/62    Lab Results  Component Value Date   HGBA1C 5.9 (H) 11/15/2021     Patient obtains medications through Vials  90 Days    Last adherence delivery included:  Omeprazole 20 mg daily Metoprolol 25 mg daily  Patient declined (meds) last month:  None  Patient is due for next adherence delivery on: 07-03-2022  Called patient and reviewed medications and coordinated delivery.  This delivery to include: Omeprazole 20 mg daily Metoprolol 25 mg daily Eliquis 2.5 mg twice daily  Patient declined the following medications: Flonase- Plenty of supply  Patient needs refills for:  Eliquis- Sent to PCP Metoprolol- Chasity sent to specialists  NOTES: Chasity from [facility] wanted me to ask patient if lipitor was needed. Patient has a 90 DS bottle of 20 mg that she hasn't started yet. Patient will cut 20 mg in half. Patient requested delivery to be on the 2nd route.  Confirmed delivery date of 07-03-2022 advised patient that pharmacy will contact them the morning of delivery.  [place] Pharmacist Assistant [phone]

## 2022-06-24 ENCOUNTER — Other Ambulatory Visit: Payer: Self-pay

## 2022-06-24 DIAGNOSIS — R7881 Bacteremia: Secondary | ICD-10-CM | POA: Diagnosis not present

## 2022-06-24 DIAGNOSIS — N2581 Secondary hyperparathyroidism of renal origin: Secondary | ICD-10-CM | POA: Diagnosis not present

## 2022-06-24 DIAGNOSIS — N186 End stage renal disease: Secondary | ICD-10-CM | POA: Diagnosis not present

## 2022-06-24 DIAGNOSIS — D689 Coagulation defect, unspecified: Secondary | ICD-10-CM | POA: Diagnosis not present

## 2022-06-24 DIAGNOSIS — E875 Hyperkalemia: Secondary | ICD-10-CM | POA: Diagnosis not present

## 2022-06-24 DIAGNOSIS — Z992 Dependence on renal dialysis: Secondary | ICD-10-CM | POA: Diagnosis not present

## 2022-06-24 DIAGNOSIS — R52 Pain, unspecified: Secondary | ICD-10-CM | POA: Diagnosis not present

## 2022-06-24 MED ORDER — APIXABAN 5 MG PO TABS: 5.0000 mg | ORAL_TABLET | Freq: Two times a day (BID) | ORAL | 2 refills | Status: DC

## 2022-06-26 DIAGNOSIS — Z992 Dependence on renal dialysis: Secondary | ICD-10-CM | POA: Diagnosis not present

## 2022-06-26 DIAGNOSIS — D689 Coagulation defect, unspecified: Secondary | ICD-10-CM | POA: Diagnosis not present

## 2022-06-26 DIAGNOSIS — E875 Hyperkalemia: Secondary | ICD-10-CM | POA: Diagnosis not present

## 2022-06-26 DIAGNOSIS — N2581 Secondary hyperparathyroidism of renal origin: Secondary | ICD-10-CM | POA: Diagnosis not present

## 2022-06-26 DIAGNOSIS — R7881 Bacteremia: Secondary | ICD-10-CM | POA: Diagnosis not present

## 2022-06-26 DIAGNOSIS — N186 End stage renal disease: Secondary | ICD-10-CM | POA: Diagnosis not present

## 2022-06-26 DIAGNOSIS — R52 Pain, unspecified: Secondary | ICD-10-CM | POA: Diagnosis not present

## 2022-06-27 ENCOUNTER — Other Ambulatory Visit (INDEPENDENT_AMBULATORY_CARE_PROVIDER_SITE_OTHER): Payer: Medicare Other | Admitting: Nurse Practitioner

## 2022-06-27 DIAGNOSIS — A4101 Sepsis due to Methicillin susceptible Staphylococcus aureus: Secondary | ICD-10-CM | POA: Diagnosis not present

## 2022-06-27 DIAGNOSIS — M7062 Trochanteric bursitis, left hip: Secondary | ICD-10-CM | POA: Insufficient documentation

## 2022-06-27 DIAGNOSIS — N185 Chronic kidney disease, stage 5: Secondary | ICD-10-CM | POA: Diagnosis not present

## 2022-06-27 DIAGNOSIS — G8929 Other chronic pain: Secondary | ICD-10-CM | POA: Diagnosis not present

## 2022-06-27 DIAGNOSIS — T827XXD Infection and inflammatory reaction due to other cardiac and vascular devices, implants and grafts, subsequent encounter: Secondary | ICD-10-CM | POA: Diagnosis not present

## 2022-06-27 DIAGNOSIS — M1612 Unilateral primary osteoarthritis, left hip: Secondary | ICD-10-CM | POA: Diagnosis not present

## 2022-06-27 DIAGNOSIS — I5032 Chronic diastolic (congestive) heart failure: Secondary | ICD-10-CM

## 2022-06-27 DIAGNOSIS — T8241XD Breakdown (mechanical) of vascular dialysis catheter, subsequent encounter: Secondary | ICD-10-CM

## 2022-06-27 DIAGNOSIS — I132 Hypertensive heart and chronic kidney disease with heart failure and with stage 5 chronic kidney disease, or end stage renal disease: Secondary | ICD-10-CM | POA: Diagnosis not present

## 2022-06-27 NOTE — Progress Notes (Signed)
Chief Complaint  Patient presents with   Initial Home Health Certification   Received home health orders orders from [facility]. Start of care 05/30/2022.   Certification and orders from 05/30/2022 through 07/28/2022 are reviewed, signed and faxed back to home health company.  Need of intermittent skilled services at home: PT, HHA  The home health care plan has been established by me and will be reviewed and updated as needed to maximize patient recovery.  I certify that all home health services have been and will be furnished to the patient while under my care.  Face-to-face encounter in which the need for home health services was established: 05/30/2022  Patient is receiving home health services for the following diagnoses: Problem List Items Addressed This Visit       Cardiovascular and Mediastinum   Chronic diastolic heart failure (HCC) (Chronic)   Hypertensive heart and kidney disease with heart failure and chronic kidney disease stage V ([place])     Musculoskeletal and Integument   Primary osteoarthritis of left hip - Primary   Trochanteric bursitis of left hip     Other   Other chronic pain   Other Visit Diagnoses     Mechanical breakdown of vascular dialysis catheter, subsequent encounter ([place])       Infection and inflammatory reaction due to cardiac device, implant, and graft, subsequent encounter       Methicillin susceptible Staphylococcus aureus septicemia ([place])            [name], FNP

## 2022-06-28 DIAGNOSIS — I5032 Chronic diastolic (congestive) heart failure: Secondary | ICD-10-CM

## 2022-06-28 DIAGNOSIS — N2581 Secondary hyperparathyroidism of renal origin: Secondary | ICD-10-CM | POA: Diagnosis not present

## 2022-06-28 DIAGNOSIS — N186 End stage renal disease: Secondary | ICD-10-CM | POA: Diagnosis not present

## 2022-06-28 DIAGNOSIS — Z992 Dependence on renal dialysis: Secondary | ICD-10-CM | POA: Diagnosis not present

## 2022-06-28 DIAGNOSIS — I2510 Atherosclerotic heart disease of native coronary artery without angina pectoris: Secondary | ICD-10-CM

## 2022-06-28 DIAGNOSIS — I10 Essential (primary) hypertension: Secondary | ICD-10-CM

## 2022-06-28 DIAGNOSIS — I129 Hypertensive chronic kidney disease with stage 1 through stage 4 chronic kidney disease, or unspecified chronic kidney disease: Secondary | ICD-10-CM | POA: Diagnosis not present

## 2022-06-28 DIAGNOSIS — R7881 Bacteremia: Secondary | ICD-10-CM | POA: Diagnosis not present

## 2022-06-28 DIAGNOSIS — E875 Hyperkalemia: Secondary | ICD-10-CM | POA: Diagnosis not present

## 2022-06-28 DIAGNOSIS — R52 Pain, unspecified: Secondary | ICD-10-CM | POA: Diagnosis not present

## 2022-06-28 DIAGNOSIS — D689 Coagulation defect, unspecified: Secondary | ICD-10-CM | POA: Diagnosis not present

## 2022-07-01 ENCOUNTER — Other Ambulatory Visit: Payer: Self-pay

## 2022-07-01 DIAGNOSIS — E875 Hyperkalemia: Secondary | ICD-10-CM | POA: Diagnosis not present

## 2022-07-01 DIAGNOSIS — N186 End stage renal disease: Secondary | ICD-10-CM | POA: Diagnosis not present

## 2022-07-01 DIAGNOSIS — Z992 Dependence on renal dialysis: Secondary | ICD-10-CM | POA: Diagnosis not present

## 2022-07-01 DIAGNOSIS — D689 Coagulation defect, unspecified: Secondary | ICD-10-CM | POA: Diagnosis not present

## 2022-07-01 DIAGNOSIS — N2581 Secondary hyperparathyroidism of renal origin: Secondary | ICD-10-CM | POA: Diagnosis not present

## 2022-07-01 MED ORDER — METOPROLOL SUCCINATE ER 25 MG PO TB24: 25.0000 mg | ORAL_TABLET | Freq: Every day | ORAL | 1 refills | Status: DC

## 2022-07-03 DIAGNOSIS — E875 Hyperkalemia: Secondary | ICD-10-CM | POA: Diagnosis not present

## 2022-07-03 DIAGNOSIS — N2581 Secondary hyperparathyroidism of renal origin: Secondary | ICD-10-CM | POA: Diagnosis not present

## 2022-07-03 DIAGNOSIS — D689 Coagulation defect, unspecified: Secondary | ICD-10-CM | POA: Diagnosis not present

## 2022-07-03 DIAGNOSIS — Z992 Dependence on renal dialysis: Secondary | ICD-10-CM | POA: Diagnosis not present

## 2022-07-03 DIAGNOSIS — N186 End stage renal disease: Secondary | ICD-10-CM | POA: Diagnosis not present

## 2022-07-04 ENCOUNTER — Encounter: Payer: Self-pay | Admitting: Internal Medicine

## 2022-07-04 ENCOUNTER — Other Ambulatory Visit: Payer: Self-pay

## 2022-07-04 ENCOUNTER — Telehealth: Payer: Self-pay

## 2022-07-04 ENCOUNTER — Ambulatory Visit
Admission: RE | Admit: 2022-07-04 | Discharge: 2022-07-04 | Disposition: A | Discharge status: Home / Self Care | Payer: Medicare Other | Source: Ambulatory Visit | Attending: Nurse Practitioner | Admitting: Nurse Practitioner

## 2022-07-04 ENCOUNTER — Ambulatory Visit (INDEPENDENT_AMBULATORY_CARE_PROVIDER_SITE_OTHER): Payer: Medicare Other | Admitting: Internal Medicine

## 2022-07-04 VITALS — BP 121/80 | HR 107 | Temp 98.0°F | Resp 16 | Ht 62.0 in | Wt 204.0 lb

## 2022-07-04 DIAGNOSIS — B9561 Methicillin susceptible Staphylococcus aureus infection as the cause of diseases classified elsewhere: Secondary | ICD-10-CM | POA: Diagnosis not present

## 2022-07-04 DIAGNOSIS — R7881 Bacteremia: Secondary | ICD-10-CM

## 2022-07-04 DIAGNOSIS — A411 Sepsis due to other specified staphylococcus: Secondary | ICD-10-CM

## 2022-07-04 DIAGNOSIS — A419 Sepsis, unspecified organism: Secondary | ICD-10-CM | POA: Diagnosis not present

## 2022-07-04 DIAGNOSIS — R6883 Chills (without fever): Secondary | ICD-10-CM

## 2022-07-04 LAB — TSH+FREE T4: TSH W/REFLEX TO FT4: 0.99 mIU/L (ref 0.40–4.50)

## 2022-07-04 NOTE — Progress Notes (Signed)
[facility]  Patient Active Problem List   Diagnosis Date Noted   Primary osteoarthritis of left hip 06/27/2022   Trochanteric bursitis of left hip 06/27/2022   Other chronic pain 06/27/2022   Hypertensive heart and kidney disease with heart failure and chronic kidney disease stage V ([place]) 06/27/2022   Sepsis due to other specified Staphylococcus ([place])    Fall 05/16/2022   Left hip pain 05/16/2022   Tobacco abuse 05/16/2022   Hypotension 05/16/2022   Chest pain 05/02/2022   Elevated troponin    Non-ST elevation (NSTEMI) myocardial infarction [facility])    Chest wall pain 05/01/2022   History of pulmonary embolism 05/01/2022   Protrusion of lumbar intervertebral disc 10/09/2021   Facet arthritis, degenerative, lumbar spine 10/09/2021   Acute low back pain    MSSA bacteremia    Septic shock ([place]) 07/16/2021   Nausea and vomiting    Abdominal pain    FUO (fever of unknown origin)    Polyarthritis    Polyarthralgia    Hyperkalemia, diminished renal excretion 05/28/2021   Generalized weakness 05/28/2021   Rash and nonspecific skin eruption 05/28/2021   Atypical chest pain    Coronary artery calcification seen on CAT scan    Infection due to central venous catheter 07/14/2020   Hyperkalemia 05/09/2020   Left leg pain 05/09/2020   Restless leg syndrome 09/19/2018   OSA (obstructive sleep apnea) 04/29/2018   ESRD (end stage renal disease) ([place]) 04/29/2018   Dyspnea on exertion 03/12/2017   Morbid (severe) obesity due to excess calories ([place]) 03/12/2017   Angina pectoris ([place]) 03/02/2017   Anemia of chronic kidney failure, stage 3 (moderate) ([place]) 12/10/2016   History of colon cancer 08/12/2016   Mass of left breast on mammogram 12/10/2015   Depression 10/18/2015   Numbness and tingling of left arm and leg 01/24/2015   PVD (peripheral vascular disease) with claudication ([place]) 03/23/2014   PAD (peripheral artery disease) ([place]) 12/14/2013    Chronic diastolic heart failure ([place]) 11/15/2013   Essential hypertension 11/15/2013   Leucocytosis 04/28/2012      Subjective:    Patient ID: Whitney Walker, female    DOB: 12-28-1956, 66 y.o.   MRN: [id number]  Chief Complaint  Patient presents with   Hospitalization Follow-up    Still having chills with no fever.Complains of mid back pain 5/10 pain scale. Requested labs done on 07/03/22 from Dialysis.     HPI:  Whitney Walker is a 66 y.o. female esrd here for hospital f/u mssa bacteremia from 05/16/2022 admission  She finished cefazolin 7/1 6 weeks abx given due to concern left hip joint and surrounding muscle mri changes and left hip pain  Infection thought to due hd line which was removed  The pain in left hip area (lateroposterior) had resolved  A new line was placed in the same area again  Her appetite is "so so" despite craving lots of thing. No weight loss  Patient reports chills... currently sitting in the clinic feels chill. This is intermittent and chronic  No subjective fever No rigor   Allergies  Allergen Reactions   Carnosine     Other reaction(s): Unknown   Gadolinium Derivatives Hives and Other (See Comments)    HIVES, Desc: HIVES W/ "DYE" USED FOR 1ST CT SCAN BUT NOT 2ND, NO PREMEDS USED, PT UNCERTAIN OF CIRCUMSTANCES,,?POSSIBLE MRI CONTRAST ALLERGY, ALL STUDIES DONE "SOMEWHERE" IN [place]//A.C., Onset Date: 80998338   Iohexol  Hives and Other (See Comments)    Desc: HIVES W/ "DYE" USED FOR 1ST CT SCAN BUT NOT 2ND, NO PREMEDS USED, PT UNCERTAIN OF CIRCUMSTANCES,,?POSSIBLE MRI CONTRAST ALLERGY, ALL STUDIES DONE "SOMEWHERE" IN [place]//A.C., Onset Date: 16109604    Iodinated Contrast Media Hives   Iodine Hives   Naltrexone Other (See Comments)    Unknown      Outpatient Medications Prior to Visit  Medication Sig Dispense Refill   acetaminophen (TYLENOL) 325 MG tablet Take by mouth.     albuterol (VENTOLIN HFA) 108 (90 Base)  MCG/ACT inhaler Inhale 2 puffs into the lungs every 6 (six) hours as needed for wheezing or shortness of breath. 18 g 0   apixaban (ELIQUIS) 5 MG TABS tablet Take 1 tablet (5 mg total) by mouth 2 (two) times daily. 60 tablet 2   Ascorbic Acid (VITAMIN C) 100 MG tablet Take 100 mg by mouth daily.     AURYXIA 1 GM 210 MG(Fe) tablet Take 420-840 mg by mouth See admin instructions. Take '840mg'$  by mouth three times daily with meals and '420mg'$  with snacks.     Biotin 5000 MCG SUBL Place under the tongue.     diclofenac Sodium (VOLTAREN) 1 % GEL Apply topically.     fluticasone (FLONASE) 50 MCG/ACT nasal spray Place 2 sprays into both nostrils daily. 18.2 mL 2   ketorolac (ACULAR) 0.5 % ophthalmic solution instill ONE drop into LEFT EYE four times daily STARTING ONE DAY BEFORE surgery     meclizine (ANTIVERT) 12.5 MG tablet Take 1 tablet (12.5 mg total) by mouth 3 (three) times daily as needed for dizziness. 30 tablet 0   ofloxacin (OCUFLOX) 0.3 % ophthalmic solution instill ONE drop into THE LEFT EYE four times daily STARTING one DAY BEFORE surgery     omeprazole (PRILOSEC) 20 MG capsule Take 1 capsule (20 mg total) by mouth daily. 30 capsule 0   vitamin E 200 UNIT capsule Take 200 Units by mouth daily.     ascorbic acid (VITAMIN C) 100 MG tablet Take by mouth.     colchicine 0.6 MG tablet      ferric citrate (AURYXIA) 1 GM 210 MG(Fe) tablet Take by mouth.     amLODipine (NORVASC) 5 MG tablet Take 1 tablet by mouth daily. (Patient not taking: Reported on 07/04/2022)     aspirin 81 MG chewable tablet Chew 1 tablet (81 mg total) by mouth daily. (Patient not taking: Reported on 07/04/2022) 30 tablet 0   carvedilol (COREG) 12.5 MG tablet Take 1 tablet by mouth 2 (two) times daily. (Patient not taking: Reported on 07/04/2022)     cyclobenzaprine (FLEXERIL) 10 MG tablet  (Patient not taking: Reported on 07/04/2022)     diphenhydrAMINE (BANOPHEN) 25 MG tablet TAKE TWO TABLETS ('50mg'$  total) BY MOUTH ONCE FOR ONE DOSE AT  9AM ON 6/2 (Patient not taking: Reported on 07/04/2022)     Doxercalciferol (HECTOROL IV) Doxercalciferol (Hectorol) (Patient not taking: Reported on 07/04/2022)     doxycycline (VIBRA-TABS) 100 MG tablet TAKE ONE TABLET BY MOUTH TWICE DAILY DO not take with binders (Patient not taking: Reported on 07/04/2022)     ezetimibe (ZETIA) 10 MG tablet Take 1 tablet by mouth daily. (Patient not taking: Reported on 07/04/2022)     gabapentin (NEURONTIN) 300 MG capsule Take by mouth. (Patient not taking: Reported on 07/04/2022)     melatonin 3 MG TABS tablet TAKE ONE TABLET BY MOUTH EVERYDAY AT BEDTIME AS NEEDED (Patient not taking: Reported on 07/04/2022)  midodrine (PROAMATINE) 10 MG tablet Take 5 mg by mouth every Monday, Wednesday, and Friday with hemodialysis. (Patient not taking: Reported on 06/18/2022)     predniSONE (DELTASONE) 20 MG tablet TAKE TWO TABLETS BY MOUTH AT 7pm A DAY prior TO procedue and TAKE TWO TABLETS BY MOUTH AT 11pm (tonight) and TAKE TWO TABLETS BY MOUTH AT 7am (Patient not taking: Reported on 07/04/2022)     acetaminophen (TYLENOL) 325 MG tablet Take 2 tablets (650 mg total) by mouth every 8 (eight) hours as needed. (Patient not taking: Reported on 06/18/2022) 40 tablet 0   atorvastatin (LIPITOR) 10 MG tablet TAKE ONE TABLET BY MOUTH ONCE DAILY (Patient taking differently: Take 10 mg by mouth every evening.) 30 tablet 3   atorvastatin (LIPITOR) 20 MG tablet Take 1 tablet by mouth daily.     BANOPHEN 25 MG tablet Take 50 mg by mouth every morning.     Biotin 10000 MCG TABS Take 10,000 mcg by mouth daily.      diclofenac Sodium (VOLTAREN) 1 % GEL Apply 2 g topically 4 (four) times daily. 100 g 0   diphenhydrAMINE (BENADRYL) 50 MG tablet Take 1 tablet (50 mg total) by mouth once for 1 dose. Take at 9am 6/2 1 tablet 0   gabapentin (NEURONTIN) 300 MG capsule Take 300 mg by mouth daily. (Patient not taking: Reported on 06/18/2022)     metoprolol succinate (TOPROL-XL) 25 MG 24 hr tablet Take 1 tablet  (25 mg total) by mouth daily. 30 tablet 1   multivitamin (RENA-VIT) TABS tablet Take 1 tablet by mouth every evening.     oxyCODONE-acetaminophen (PERCOCET/ROXICET) 5-325 MG tablet TAKE ONE TABLET BY MOUTH EVERY 4 HOURS AS NEEDED FOR SEVERE PAIN     predniSONE (DELTASONE) 50 MG tablet Take 1 tab po at 9pm 6/1, Take 1 tab po at 3am 6/2, Take 1 tab po at 9am 6/2 (Patient not taking: Reported on 06/18/2022) 3 tablet 0   No facility-administered medications prior to visit.     Social History   Socioeconomic History   Marital status: Single    Spouse name: Not on file   Number of children: 4   Years of education: 12th gd   Highest education level: Not on file  Occupational History   Occupation: N/A  Tobacco Use   Smoking status: Some Days    Packs/day: 0.50    Years: 50.00    Total pack years: 25.00    Types: Cigarettes    Last attempt to quit: 02/03/2021    Years since quitting: 1.4   Smokeless tobacco: Former   Tobacco comments:    She is using a vape when she runs out of her cigarettes  Vaping Use   Vaping Use: Every day   Start date: 06/28/2020   Last attempt to quit: 05/20/2022   Devices: Vuse  Substance and Sexual Activity   Alcohol use: No    Alcohol/week: 0.0 standard drinks of alcohol    Comment: she used to drink alcohol, quit in 2010; h/o heavy use    Drug use: Not Currently    Types: Oxycodone   Sexual activity: Not Currently  Other Topics Concern   Not on file  Social History Narrative   2 cups of coffee a day    Social Determinants of Health   Financial Resource Strain: Low Risk  (03/14/2022)   Overall Financial Resource Strain (CARDIA)    Difficulty of Paying Living Expenses: Not hard at all  Food Insecurity: No  Food Insecurity (03/14/2022)   Hunger Vital Sign    Worried About Running Out of Food in the Last Year: Never true    Ran Out of Food in the Last Year: Never true  Transportation Needs: No Transportation Needs (03/14/2022)   PRAPARE - Armed forces logistics/support/administrative officer (Medical): No    Lack of Transportation (Non-Medical): No  Physical Activity: Inactive (03/14/2022)   Exercise Vital Sign    Days of Exercise per Week: 0 days    Minutes of Exercise per Session: 0 min  Stress: Stress Concern Present (03/14/2022)   [place]    Feeling of Stress : To some extent  Social Connections: Not on file  Intimate Partner Violence: Not At Risk (06/10/2019)   Humiliation, Afraid, Rape, and Kick questionnaire    Fear of Current or Ex-Partner: No    Emotionally Abused: No    Physically Abused: No    Sexually Abused: No      Review of Systems    All other ros negative    Objective:    BP 121/80   Pulse (!) 107   Temp 98 F (36.7 C) (Oral)   Resp 16   Ht '5\' 2"'$  (1.575 m)   Wt 204 lb (92.5 kg)   SpO2 93%   BMI 37.31 kg/m  Nursing note and vital signs reviewed.  Physical Exam     General/constitutional: no distress, pleasant HEENT: Normocephalic, PER, Conj Clear, EOMI, Oropharynx clear Neck supple CV: rrr no mrg Lungs: clear to auscultation, normal respiratory effort Abd: Soft, Nontender Ext: no edema Skin: No Rash Neuro: nonfocal MSK: no peripheral joint swelling/tenderness/warmth; back spines nontender  Central line: left chest hd line site no tenderness/purulence  Labs:  Micro:  Serology:  Imaging: 5/18 mri left hip without contrast 1. Abnormal edema in the left sacral ala is infiltrative but nonspecific. A stress fracture could potentially give this appearance and the adjacent edema tracking along the left iliacus muscle and left sacral plexus. Left sacral osteomyelitis is not totally excluded but there no effusion of the SI joint or edema along the iliac side of the SI joint to further suggest septic SI joint. 2. Low-level edema anteriorly in the gluteus maximus muscle near the greater trochanter, nonspecific but possibly reflecting a  muscle strain.   Assessment & Plan:   Problem List Items Addressed This Visit   None     No orders of the defined types were placed in this encounter.    66 yo female esrd via previous left HD line admitted 5/18 for mssa bacteremia thought to be related to line, which was removed. Repeat bcx negative within 72 hours and tte reassuring so tee deferred. Given 6 weeks cefazolin with dialysis as also has pain/imaging effusion left hip joint   Finished cefazolin 7/01   Dialysis center without inflammatory labs  Given patient sx resolution, and potential elevation of crp with esrd, advised her to monitor left hip sx to call if increasing pain or fever chill   Tsh checked for her cold intolerance/chill   No scheduled appointment needed for id f/u at this time   Follow-up: Return if symptoms worsen or fail to improve.      [name], [facility] Infectious [facility] 07/04/2022, 9:37 AM

## 2022-07-04 NOTE — Patient Instructions (Signed)
If increasing left hip pain, fever, chill, feeling unwell, shaking, malaise then call our clinic and go to ED   No need to schedule any follow with me at this time

## 2022-07-04 NOTE — Telephone Encounter (Signed)
Lab results requested from [facility] [phone]. Will fax to [place] but only has BUN and Potassium results.

## 2022-07-05 DIAGNOSIS — N2581 Secondary hyperparathyroidism of renal origin: Secondary | ICD-10-CM | POA: Diagnosis not present

## 2022-07-05 DIAGNOSIS — D689 Coagulation defect, unspecified: Secondary | ICD-10-CM | POA: Diagnosis not present

## 2022-07-05 DIAGNOSIS — E875 Hyperkalemia: Secondary | ICD-10-CM | POA: Diagnosis not present

## 2022-07-05 DIAGNOSIS — N186 End stage renal disease: Secondary | ICD-10-CM | POA: Diagnosis not present

## 2022-07-05 DIAGNOSIS — Z992 Dependence on renal dialysis: Secondary | ICD-10-CM | POA: Diagnosis not present

## 2022-07-08 DIAGNOSIS — Z992 Dependence on renal dialysis: Secondary | ICD-10-CM | POA: Diagnosis not present

## 2022-07-08 DIAGNOSIS — N186 End stage renal disease: Secondary | ICD-10-CM | POA: Diagnosis not present

## 2022-07-08 DIAGNOSIS — E875 Hyperkalemia: Secondary | ICD-10-CM | POA: Diagnosis not present

## 2022-07-08 DIAGNOSIS — N2581 Secondary hyperparathyroidism of renal origin: Secondary | ICD-10-CM | POA: Diagnosis not present

## 2022-07-08 DIAGNOSIS — D689 Coagulation defect, unspecified: Secondary | ICD-10-CM | POA: Diagnosis not present

## 2022-07-09 ENCOUNTER — Ambulatory Visit: Payer: Medicare Other

## 2022-07-09 DIAGNOSIS — I6523 Occlusion and stenosis of bilateral carotid arteries: Secondary | ICD-10-CM

## 2022-07-10 DIAGNOSIS — D689 Coagulation defect, unspecified: Secondary | ICD-10-CM | POA: Diagnosis not present

## 2022-07-10 DIAGNOSIS — N186 End stage renal disease: Secondary | ICD-10-CM | POA: Diagnosis not present

## 2022-07-10 DIAGNOSIS — E875 Hyperkalemia: Secondary | ICD-10-CM | POA: Diagnosis not present

## 2022-07-10 DIAGNOSIS — Z992 Dependence on renal dialysis: Secondary | ICD-10-CM | POA: Diagnosis not present

## 2022-07-10 DIAGNOSIS — N2581 Secondary hyperparathyroidism of renal origin: Secondary | ICD-10-CM | POA: Diagnosis not present

## 2022-07-12 DIAGNOSIS — Z992 Dependence on renal dialysis: Secondary | ICD-10-CM | POA: Diagnosis not present

## 2022-07-12 DIAGNOSIS — N2581 Secondary hyperparathyroidism of renal origin: Secondary | ICD-10-CM | POA: Diagnosis not present

## 2022-07-12 DIAGNOSIS — E875 Hyperkalemia: Secondary | ICD-10-CM | POA: Diagnosis not present

## 2022-07-12 DIAGNOSIS — N186 End stage renal disease: Secondary | ICD-10-CM | POA: Diagnosis not present

## 2022-07-12 DIAGNOSIS — D689 Coagulation defect, unspecified: Secondary | ICD-10-CM | POA: Diagnosis not present

## 2022-07-15 DIAGNOSIS — H2511 Age-related nuclear cataract, right eye: Secondary | ICD-10-CM | POA: Diagnosis not present

## 2022-07-15 DIAGNOSIS — Z992 Dependence on renal dialysis: Secondary | ICD-10-CM | POA: Diagnosis not present

## 2022-07-15 DIAGNOSIS — N2581 Secondary hyperparathyroidism of renal origin: Secondary | ICD-10-CM | POA: Diagnosis not present

## 2022-07-15 DIAGNOSIS — D689 Coagulation defect, unspecified: Secondary | ICD-10-CM | POA: Diagnosis not present

## 2022-07-15 DIAGNOSIS — N186 End stage renal disease: Secondary | ICD-10-CM | POA: Diagnosis not present

## 2022-07-15 DIAGNOSIS — E875 Hyperkalemia: Secondary | ICD-10-CM | POA: Diagnosis not present

## 2022-07-17 ENCOUNTER — Telehealth: Payer: Self-pay

## 2022-07-17 DIAGNOSIS — E875 Hyperkalemia: Secondary | ICD-10-CM | POA: Diagnosis not present

## 2022-07-17 DIAGNOSIS — N2581 Secondary hyperparathyroidism of renal origin: Secondary | ICD-10-CM | POA: Diagnosis not present

## 2022-07-17 DIAGNOSIS — N186 End stage renal disease: Secondary | ICD-10-CM | POA: Diagnosis not present

## 2022-07-17 DIAGNOSIS — Z992 Dependence on renal dialysis: Secondary | ICD-10-CM | POA: Diagnosis not present

## 2022-07-17 DIAGNOSIS — D689 Coagulation defect, unspecified: Secondary | ICD-10-CM | POA: Diagnosis not present

## 2022-07-17 NOTE — Chronic Care Management (AMB) (Signed)
Care Gap(s) Not Met that Need to be Addressed:   Hemoglobin A1c Control for Patients With Diabetes   Action Taken: Reviewed patient's chart and patient is controlling diabetes with life style changes per PCP. Contacted  patient and she doesn't have to check blood sugar at home. Last A1C 11-15-2021 5.9.   Follow Up: None needed  [place] Clinical Pharmacist Assistant [phone]

## 2022-07-19 ENCOUNTER — Telehealth: Payer: Self-pay | Admitting: *Deleted

## 2022-07-19 ENCOUNTER — Other Ambulatory Visit: Payer: Self-pay | Admitting: Hematology

## 2022-07-19 DIAGNOSIS — D689 Coagulation defect, unspecified: Secondary | ICD-10-CM | POA: Diagnosis not present

## 2022-07-19 DIAGNOSIS — Z992 Dependence on renal dialysis: Secondary | ICD-10-CM | POA: Diagnosis not present

## 2022-07-19 DIAGNOSIS — K219 Gastro-esophageal reflux disease without esophagitis: Secondary | ICD-10-CM

## 2022-07-19 DIAGNOSIS — E875 Hyperkalemia: Secondary | ICD-10-CM | POA: Diagnosis not present

## 2022-07-19 DIAGNOSIS — N186 End stage renal disease: Secondary | ICD-10-CM | POA: Diagnosis not present

## 2022-07-19 DIAGNOSIS — N2581 Secondary hyperparathyroidism of renal origin: Secondary | ICD-10-CM | POA: Diagnosis not present

## 2022-07-19 NOTE — Telephone Encounter (Signed)
Dr. Burr Medico did not prescribe this medication.  Per Dr. Burr Medico, please have the pt's PCP refill this medication.

## 2022-07-19 NOTE — Chronic Care Management (AMB) (Signed)
  Chronic Care Management Note  07/19/2022 Name: [name] MRN: [id number] DOB: 09-Apr-1956  Whitney Walker is a 66 y.o. year old female who is a primary care patient of [name], [place] and is actively engaged with the care management team. I reached out to Kyra Leyland by phone today to assist with re-scheduling a follow up visit with the RN Case Manager  Follow up plan: Unsuccessful telephone outreach attempt made. A HIPAA compliant phone message was left for the patient providing contact information and requesting a return call.  The care management team will reach out to the patient again over the next 7 days.  If patient returns call to provider office, please advise to call [place] at [phone].  [place]  Direct Dial: [phone]

## 2022-07-19 NOTE — Chronic Care Management (AMB) (Signed)
  Chronic Care Management Note  07/19/2022 Name: Whitney Walker MRN: [id number] DOB: August 24, 1956  Whitney Walker is a 66 y.o. year old female who is a primary care patient of [name], [place] and is actively engaged with the care management team. I reached out to Kyra Leyland by phone today to assist with re-scheduling a follow up visit with the RN Case Manager  Follow up plan: Telephone appointment with care management team member scheduled for:08/08/22  [place]  Direct Dial: [phone]

## 2022-07-22 ENCOUNTER — Telehealth: Payer: Self-pay

## 2022-07-22 DIAGNOSIS — N2581 Secondary hyperparathyroidism of renal origin: Secondary | ICD-10-CM | POA: Diagnosis not present

## 2022-07-22 DIAGNOSIS — D689 Coagulation defect, unspecified: Secondary | ICD-10-CM | POA: Diagnosis not present

## 2022-07-22 DIAGNOSIS — E875 Hyperkalemia: Secondary | ICD-10-CM | POA: Diagnosis not present

## 2022-07-22 DIAGNOSIS — Z992 Dependence on renal dialysis: Secondary | ICD-10-CM | POA: Diagnosis not present

## 2022-07-22 DIAGNOSIS — N186 End stage renal disease: Secondary | ICD-10-CM | POA: Diagnosis not present

## 2022-07-22 NOTE — Chronic Care Management (AMB) (Signed)
Chronic Care Management Pharmacy Assistant   Name: Whitney Walker  MRN: [id number] DOB: 1956-01-16   Reason for Encounter: Medication Review/ Medication coordination  Recent office visits:  06-27-2022 Whitney Walker, [place]. Visit for home health.  Recent consult visits:  07-04-2022 Whitney Mutton, MD (Infectious disease). STOP metoprolol, colchicine, atorvastatin, B-complex, oxyCODONE-acetaminophen. Patient reported not taking prednisone, gabapentin.  Hospital visits:  Medication Reconciliation was completed by comparing discharge summary, patient's EMR and Pharmacy list, and upon discussion with patient.   Admitted to the hospital on 05-16-2022 due to a fall. Discharge date was 05-22-2022. Discharged from [facility]?Medications Started at [facility] Discharge:?? Tylenol 650 mg every 8 hours as needed Ancef inject 50 mLs into vein daily for 8 days Colchicine 0.6 mg daily Voltaren apply 2 grams 4 times daily   Medication Changes at Hospital Discharge: None   Medications Discontinued at Hospital Discharge: Percocet Flexeril   Medications that remain the same after Hospital Discharge:??  -All other medications will remain the same.    Medications: Outpatient Encounter Medications as of 07/22/2022  Medication Sig   acetaminophen (TYLENOL) 325 MG tablet Take by mouth.   albuterol (VENTOLIN HFA) 108 (90 Base) MCG/ACT inhaler Inhale 2 puffs into the lungs every 6 (six) hours as needed for wheezing or shortness of breath.   amLODipine (NORVASC) 5 MG tablet Take 1 tablet by mouth daily. (Patient not taking: Reported on 07/04/2022)   apixaban (ELIQUIS) 5 MG TABS tablet Take 1 tablet (5 mg total) by mouth 2 (two) times daily.   Ascorbic Acid (VITAMIN C) 100 MG tablet Take 100 mg by mouth daily.   aspirin 81 MG chewable tablet Chew 1 tablet (81 mg total) by mouth daily. (Patient not taking: Reported on 07/04/2022)   AURYXIA 1 GM 210 MG(Fe) tablet Take 420-840 mg by mouth  See admin instructions. Take '840mg'$  by mouth three times daily with meals and '420mg'$  with snacks.   Biotin 5000 MCG SUBL Place under the tongue.   carvedilol (COREG) 12.5 MG tablet Take 1 tablet by mouth 2 (two) times daily. (Patient not taking: Reported on 07/04/2022)   cyclobenzaprine (FLEXERIL) 10 MG tablet  (Patient not taking: Reported on 07/04/2022)   diclofenac Sodium (VOLTAREN) 1 % GEL Apply topically.   diphenhydrAMINE (BANOPHEN) 25 MG tablet TAKE TWO TABLETS ('50mg'$  total) BY MOUTH ONCE FOR ONE DOSE AT 9AM ON 6/2 (Patient not taking: Reported on 07/04/2022)   Doxercalciferol (HECTOROL IV) Doxercalciferol (Hectorol) (Patient not taking: Reported on 07/04/2022)   doxycycline (VIBRA-TABS) 100 MG tablet TAKE ONE TABLET BY MOUTH TWICE DAILY DO not take with binders (Patient not taking: Reported on 07/04/2022)   ezetimibe (ZETIA) 10 MG tablet Take 1 tablet by mouth daily. (Patient not taking: Reported on 07/04/2022)   fluticasone (FLONASE) 50 MCG/ACT nasal spray Place 2 sprays into both nostrils daily.   gabapentin (NEURONTIN) 300 MG capsule Take by mouth. (Patient not taking: Reported on 07/04/2022)   ketorolac (ACULAR) 0.5 % ophthalmic solution instill ONE drop into LEFT EYE four times daily STARTING ONE DAY BEFORE surgery   meclizine (ANTIVERT) 12.5 MG tablet Take 1 tablet (12.5 mg total) by mouth 3 (three) times daily as needed for dizziness.   melatonin 3 MG TABS tablet TAKE ONE TABLET BY MOUTH EVERYDAY AT BEDTIME AS NEEDED (Patient not taking: Reported on 07/04/2022)   midodrine (PROAMATINE) 10 MG tablet Take 5 mg by mouth every Monday, Wednesday, and Friday with hemodialysis. (Patient not taking: Reported  on 06/18/2022)   ofloxacin (OCUFLOX) 0.3 % ophthalmic solution instill ONE drop into THE LEFT EYE four times daily STARTING one DAY BEFORE surgery   omeprazole (PRILOSEC) 20 MG capsule Take 1 capsule (20 mg total) by mouth daily.   predniSONE (DELTASONE) 20 MG tablet TAKE TWO TABLETS BY MOUTH AT 7pm A DAY  prior TO procedue and TAKE TWO TABLETS BY MOUTH AT 11pm (tonight) and TAKE TWO TABLETS BY MOUTH AT 7am (Patient not taking: Reported on 07/04/2022)   vitamin E 200 UNIT capsule Take 200 Units by mouth daily.   No facility-administered encounter medications on file as of 07/22/2022.  Reviewed chart for medication changes ahead of medication coordination call.  BP Readings from Last 3 Encounters:  07/04/22 121/80  05/30/22 110/60  05/22/22 105/63    Lab Results  Component Value Date   HGBA1C 5.9 (H) 11/15/2021     Patient obtains medications through [facility]  90 Days   Last adherence delivery included:  Omeprazole 20 mg daily Metoprolol 25 mg daily Eliquis 2.5 mg twice daily  Patient declined (meds) last delivery: Delynn Flavin of supply  Patient is due for next adherence delivery on: 08-01-2022  Called patient and reviewed medications and coordinated delivery.  This delivery to include: Metoprolol 25 mg daily Albuterol (Sent refill request to PCP)  No short/acute fill needed  Patient declined the following medications: Omeprazole- plenty supply Atorvastatin- plenty supply Flonase- plenty supply  Patient needs refills for: sent by [facility] Omeprazole  Confirmed delivery date of 08-01-2022 advised patient that pharmacy will contact them the morning of delivery.  Care Gaps: Shingrix overdue Colonoscopy overdue  Star Rating Drugs: None  [place] Clinical Pharmacist Assistant [phone]

## 2022-07-23 DIAGNOSIS — H2511 Age-related nuclear cataract, right eye: Secondary | ICD-10-CM | POA: Diagnosis not present

## 2022-07-24 ENCOUNTER — Other Ambulatory Visit: Payer: Self-pay

## 2022-07-24 DIAGNOSIS — E875 Hyperkalemia: Secondary | ICD-10-CM | POA: Diagnosis not present

## 2022-07-24 DIAGNOSIS — Z992 Dependence on renal dialysis: Secondary | ICD-10-CM | POA: Diagnosis not present

## 2022-07-24 DIAGNOSIS — N186 End stage renal disease: Secondary | ICD-10-CM | POA: Diagnosis not present

## 2022-07-24 DIAGNOSIS — D689 Coagulation defect, unspecified: Secondary | ICD-10-CM | POA: Diagnosis not present

## 2022-07-24 DIAGNOSIS — N2581 Secondary hyperparathyroidism of renal origin: Secondary | ICD-10-CM | POA: Diagnosis not present

## 2022-07-24 MED ORDER — ALBUTEROL SULFATE HFA 108 (90 BASE) MCG/ACT IN AERS: 2.0000 | INHALATION_SPRAY | Freq: Four times a day (QID) | RESPIRATORY_TRACT | 0 refills | Status: DC | PRN

## 2022-07-25 ENCOUNTER — Ambulatory Visit (INDEPENDENT_AMBULATORY_CARE_PROVIDER_SITE_OTHER): Payer: Medicare Other | Admitting: Nurse Practitioner

## 2022-07-25 ENCOUNTER — Encounter: Payer: Self-pay | Admitting: Nurse Practitioner

## 2022-07-25 VITALS — BP 138/64 | HR 100 | Temp 98.5°F | Ht 62.0 in | Wt 196.0 lb

## 2022-07-25 DIAGNOSIS — I5032 Chronic diastolic (congestive) heart failure: Secondary | ICD-10-CM | POA: Diagnosis not present

## 2022-07-25 DIAGNOSIS — I10 Essential (primary) hypertension: Secondary | ICD-10-CM

## 2022-07-25 DIAGNOSIS — I110 Hypertensive heart disease with heart failure: Secondary | ICD-10-CM

## 2022-07-25 DIAGNOSIS — E2839 Other primary ovarian failure: Secondary | ICD-10-CM

## 2022-07-25 DIAGNOSIS — E559 Vitamin D deficiency, unspecified: Secondary | ICD-10-CM

## 2022-07-25 DIAGNOSIS — N186 End stage renal disease: Secondary | ICD-10-CM

## 2022-07-25 DIAGNOSIS — E6609 Other obesity due to excess calories: Secondary | ICD-10-CM

## 2022-07-25 DIAGNOSIS — Z720 Tobacco use: Secondary | ICD-10-CM

## 2022-07-25 DIAGNOSIS — Z6835 Body mass index (BMI) 35.0-35.9, adult: Secondary | ICD-10-CM

## 2022-07-25 DIAGNOSIS — I130 Hypertensive heart and chronic kidney disease with heart failure and stage 1 through stage 4 chronic kidney disease, or unspecified chronic kidney disease: Secondary | ICD-10-CM

## 2022-07-25 DIAGNOSIS — Z0000 Encounter for general adult medical examination without abnormal findings: Secondary | ICD-10-CM

## 2022-07-25 NOTE — Patient Instructions (Signed)

## 2022-07-25 NOTE — Progress Notes (Signed)
Barnet Glasgow Martin,acting as a Education administrator for [name], FNP.,have documented all relevant documentation on the behalf of [name], FNP,as directed by  [name], FNP while in the presence of [name], [place].   Subjective:     Patient ID: Whitney Walker , female    DOB: 07/30/1956 , 66 y.o.   MRN: [id number]   Chief Complaint  Patient presents with   Annual Exam    HPI  Patient presents for HM. Patient states she isn't having any other issues today.  BP Readings from Last 3 Encounters: 07/25/22 : 138/64 07/04/22 : 121/80 05/30/22 : 110/60   She is scheduled for a colonoscopy in August 2023 with Dr Collene Mares She had cataract surgery yesterday to her right eye.      Past Medical History:  Diagnosis Date   Anemia    Anxiety    CHF (congestive heart failure) (HCC)    Colon cancer (HCC)    treatment surgery   Complication of anesthesia    after first C- [place] "couldnt walk after", patient denies having a spinal   COPD (chronic obstructive pulmonary disease) ([place])    Coronary artery disease    Depression    DVT (deep venous thrombosis) (HCC)    ESRD on hemodialysis ([place])    Hemo: MWF   History of blood transfusion 04/2018   Hypertension    07/07/18- no longer takes BP medications   Meningitis    Pain in limb 07/30/2013   PE (pulmonary embolism)    Peripheral vascular disease (HCC)    Restless legs    Shortness of breath    with exertion   Sleep apnea    SOB (shortness of breath) 03/03/2019   Vertigo      Family History  Problem Relation Age of Onset   Cancer Mother 19       breast and bone   Cancer Father 52       prostate   Hypertension Sister    Bleeding Disorder Sister    Cancer Cousin 62       breast cancer    Hypertension Daughter    Breast cancer Neg Hx      Current Outpatient Medications:    acetaminophen (TYLENOL) 325 MG tablet, Take by mouth., Disp: , Rfl:    albuterol (VENTOLIN HFA) 108 (90 Base) MCG/ACT inhaler, Inhale 2 puffs into  the lungs every 6 (six) hours as needed for wheezing or shortness of breath., Disp: 18 g, Rfl: 0   apixaban (ELIQUIS) 5 MG TABS tablet, Take 1 tablet (5 mg total) by mouth 2 (two) times daily., Disp: 60 tablet, Rfl: 2   Ascorbic Acid (VITAMIN C) 100 MG tablet, Take 100 mg by mouth daily., Disp: , Rfl:    AURYXIA 1 GM 210 MG(Fe) tablet, Take 420-840 mg by mouth See admin instructions. Take '840mg'$  by mouth three times daily with meals and '420mg'$  with snacks., Disp: , Rfl:    Biotin 5000 MCG SUBL, Place under the tongue., Disp: , Rfl:    diclofenac Sodium (VOLTAREN) 1 % GEL, Apply topically., Disp: , Rfl:    fluticasone (FLONASE) 50 MCG/ACT nasal spray, Place 2 sprays into both nostrils daily., Disp: 18.2 mL, Rfl: 2   ketorolac (ACULAR) 0.5 % ophthalmic solution, instill ONE drop into LEFT EYE four times daily STARTING ONE DAY BEFORE surgery, Disp: , Rfl:    meclizine (ANTIVERT) 12.5 MG tablet, Take 1 tablet (12.5 mg total) by mouth 3 (three) times daily as  needed for dizziness., Disp: 30 tablet, Rfl: 0   ofloxacin (OCUFLOX) 0.3 % ophthalmic solution, instill ONE drop into THE LEFT EYE four times daily STARTING one DAY BEFORE surgery, Disp: , Rfl:    vitamin E 200 UNIT capsule, Take 200 Units by mouth daily., Disp: , Rfl:    diphenhydrAMINE (BANOPHEN) 25 MG tablet, TAKE TWO TABLETS ('50mg'$  total) BY MOUTH ONCE FOR ONE DOSE AT 9AM ON 6/2 (Patient not taking: Reported on 07/04/2022), Disp: , Rfl:    Doxercalciferol (HECTOROL IV), Doxercalciferol (Hectorol) (Patient not taking: Reported on 07/04/2022), Disp: , Rfl:    doxycycline (VIBRA-TABS) 100 MG tablet, TAKE ONE TABLET BY MOUTH TWICE DAILY DO not take with binders (Patient not taking: Reported on 07/04/2022), Disp: , Rfl:    ezetimibe (ZETIA) 10 MG tablet, Take 1 tablet by mouth daily. (Patient not taking: Reported on 07/04/2022), Disp: , Rfl:    gabapentin (NEURONTIN) 300 MG capsule, Take by mouth. (Patient not taking: Reported on 07/04/2022), Disp: , Rfl:     melatonin 3 MG TABS tablet, TAKE ONE TABLET BY MOUTH EVERYDAY AT BEDTIME AS NEEDED (Patient not taking: Reported on 07/04/2022), Disp: , Rfl:    midodrine (PROAMATINE) 10 MG tablet, Take 5 mg by mouth every Monday, Wednesday, and Friday with hemodialysis. (Patient not taking: Reported on 06/18/2022), Disp: , Rfl:    omeprazole (PRILOSEC) 20 MG capsule, Take 1 capsule (20 mg total) by mouth daily., Disp: 30 capsule, Rfl: 1   predniSONE (DELTASONE) 20 MG tablet, TAKE TWO TABLETS BY MOUTH AT 7pm A DAY prior TO procedue and TAKE TWO TABLETS BY MOUTH AT 11pm (tonight) and TAKE TWO TABLETS BY MOUTH AT 7am (Patient not taking: Reported on 07/04/2022), Disp: , Rfl:    Allergies  Allergen Reactions   Carnosine     Other reaction(s): Unknown   Gadolinium Derivatives Hives and Other (See Comments)    HIVES, Desc: HIVES W/ "DYE" USED FOR 1ST CT SCAN BUT NOT 2ND, NO PREMEDS USED, PT UNCERTAIN OF CIRCUMSTANCES,,?POSSIBLE MRI CONTRAST ALLERGY, ALL STUDIES DONE "SOMEWHERE" IN [place]//A.C., Onset Date: [id number]   Iohexol Hives and Other (See Comments)    Desc: HIVES W/ "DYE" USED FOR 1ST CT SCAN BUT NOT 2ND, NO PREMEDS USED, PT UNCERTAIN OF CIRCUMSTANCES,,?POSSIBLE MRI CONTRAST ALLERGY, ALL STUDIES DONE "SOMEWHERE" IN [place]//A.C., Onset Date: 47829562    Iodinated Contrast Media Hives   Iodine Hives   Naltrexone Other (See Comments)    Unknown      The patient states she is status post hysterectomy f. Negative for: breast discharge, breast lump(s), breast pain and breast self exam. Associated symptoms include abnormal vaginal bleeding. Pertinent negatives include abnormal bleeding (hematology), anxiety, decreased libido, depression, difficulty falling sleep, dyspareunia, history of infertility, nocturia, sexual dysfunction, sleep disturbances, urinary incontinence, urinary urgency, vaginal discharge and vaginal itching. Diet regular; dialysis diet.The patient states her exercise level is minimal  The  patient's tobacco use is:  Social History   Tobacco Use  Smoking Status Some Days   Packs/day: 0.50   Years: 50.00   Total pack years: 25.00   Types: Cigarettes   Last attempt to quit: 02/03/2021   Years since quitting: 1.5  Smokeless Tobacco Former  Tobacco Comments   She is using a vape when she runs out of her cigarettes, 7/27 - down to 1-2 cigs a day   She has been exposed to passive smoke. The patient's alcohol use is:  Social History   Substance and Sexual Activity  Alcohol Use No  Alcohol/week: 0.0 standard drinks of alcohol   Comment: she used to drink alcohol, quit in 2010; h/o heavy use     Review of Systems  Constitutional: Negative.   HENT: Negative.    Eyes: Negative.   Respiratory: Negative.    Cardiovascular: Negative.   Gastrointestinal: Negative.   Endocrine: Negative.   Genitourinary: Negative.   Musculoskeletal: Negative.   Skin: Negative.   Allergic/Immunologic: Negative.   Neurological: Negative.   Hematological: Negative.   Psychiatric/Behavioral: Negative.       Today's Vitals   07/25/22 1416  BP: 138/64  Pulse: 100  Temp: 98.5 F (36.9 C)  TempSrc: Oral  Weight: 196 lb (88.9 kg)  Height: '5\' 2"'$  (1.575 m)  PainSc: 0-No pain   Body mass index is 35.85 kg/m.  Wt Readings from Last 3 Encounters:  07/25/22 196 lb (88.9 kg)  07/04/22 204 lb (92.5 kg)  05/30/22 203 lb (92.1 kg)     Objective:  Physical Exam Vitals reviewed.  Constitutional:      General: She is not in acute distress.    Appearance: Normal appearance. She is well-developed. She is obese.  HENT:     Head: Normocephalic and atraumatic.     Right Ear: Hearing, tympanic membrane, ear canal and external ear normal. There is no impacted cerumen. Tympanic membrane is not bulging.     Left Ear: Hearing, tympanic membrane, ear canal and external ear normal. There is no impacted cerumen. Tympanic membrane is not bulging.     Nose: Nose normal.     Mouth/Throat:     Mouth:  Mucous membranes are moist.  Eyes:     General: Lids are normal.     Extraocular Movements: Extraocular movements intact.     Conjunctiva/sclera: Conjunctivae normal.     Pupils: Pupils are equal, round, and reactive to light.     Funduscopic exam:    Right eye: No papilledema.        Left eye: No papilledema.  Neck:     Thyroid: No thyroid mass.     Vascular: No carotid bruit.  Cardiovascular:     Rate and Rhythm: Normal rate and regular rhythm.     Pulses: Normal pulses.     Heart sounds: Normal heart sounds. No murmur heard. Pulmonary:     Effort: Pulmonary effort is normal. No respiratory distress.     Breath sounds: Normal breath sounds. No wheezing.  Chest:     Chest wall: No mass.  Breasts:    Tanner Score is 5.     Right: Normal. No mass or tenderness.     Left: Normal. No mass or tenderness.  Abdominal:     General: Abdomen is flat. Bowel sounds are normal. There is no distension.     Palpations: Abdomen is soft.     Tenderness: There is no abdominal tenderness.  Genitourinary:    Rectum: Guaiac result negative.  Musculoskeletal:        General: No swelling or tenderness. Normal range of motion.     Cervical back: Full passive range of motion without pain, normal range of motion and neck supple.     Right lower leg: No edema.     Left lower leg: No edema.  Lymphadenopathy:     Upper Body:     Right upper body: No supraclavicular, axillary or pectoral adenopathy.     Left upper body: No supraclavicular, axillary or pectoral adenopathy.  Skin:    General: Skin is warm and  dry.     Capillary Refill: Capillary refill takes less than 2 seconds.     Coloration: Skin is not jaundiced.     Findings: No bruising.     Comments: Vas cath present right chest  Neurological:     General: No focal deficit present.     Mental Status: She is alert and oriented to person, place, and time.     Cranial Nerves: No cranial nerve deficit.     Sensory: No sensory deficit.   Psychiatric:        Mood and Affect: Mood normal.        Behavior: Behavior normal.        Thought Content: Thought content normal.        Judgment: Judgment normal.         Assessment And Plan:     1. Annual physical exam  2. Class 2 obesity due to excess calories without serious comorbidity with body mass index (BMI) of 35.0 to 35.9 in adult Chronic Discussed healthy diet and regular exercise options  Encouraged to exercise at least 150 minutes per week with 2 days of strength training as tolerated. She is encouraged to strive for BMI less than 30 to decrease cardiac risk.   3. Hypertensive heart disease with chronic diastolic congestive heart failure ([place]) Comments: Blood pressure is fairly controlled, continue current medications.   4. Vitamin D deficiency Will check vitamin D level and supplement as needed.    Also encouraged to spend 15 minutes in the sun daily.   5. ESRD (end stage renal disease) ([place]) Comments: Continue dialysis  6. Tobacco abuse Comments: Encouraged to quit smoking, discussed risk of heart disease and lung disease.  - CT CHEST LUNG CA SCREEN LOW DOSE W/O CM; Future  7. Decreased estrogen level - DG Bone Density; Future   Patient was given opportunity to ask questions. Patient verbalized understanding of the plan and was able to repeat key elements of the plan. All questions were answered to their satisfaction.   [name], FNP   I, [name], FNP, have reviewed all documentation for this visit. The documentation on 07/25/22 for the exam, diagnosis, procedures, and orders are all accurate and complete.   THE PATIENT IS ENCOURAGED TO PRACTICE SOCIAL DISTANCING DUE TO THE COVID-19 PANDEMIC.

## 2022-07-26 ENCOUNTER — Other Ambulatory Visit: Payer: Self-pay | Admitting: Nurse Practitioner

## 2022-07-26 MED ORDER — OMEPRAZOLE 20 MG PO CPDR
20.0000 mg | DELAYED_RELEASE_CAPSULE | Freq: Every day | ORAL | 1 refills | Status: DC
Start: 2022-07-26 — End: 2022-09-09

## 2022-07-29 DIAGNOSIS — N186 End stage renal disease: Secondary | ICD-10-CM | POA: Diagnosis not present

## 2022-07-29 DIAGNOSIS — I129 Hypertensive chronic kidney disease with stage 1 through stage 4 chronic kidney disease, or unspecified chronic kidney disease: Secondary | ICD-10-CM | POA: Diagnosis not present

## 2022-07-29 DIAGNOSIS — N2581 Secondary hyperparathyroidism of renal origin: Secondary | ICD-10-CM | POA: Diagnosis not present

## 2022-07-29 DIAGNOSIS — E875 Hyperkalemia: Secondary | ICD-10-CM | POA: Diagnosis not present

## 2022-07-29 DIAGNOSIS — Z992 Dependence on renal dialysis: Secondary | ICD-10-CM | POA: Diagnosis not present

## 2022-07-29 DIAGNOSIS — D689 Coagulation defect, unspecified: Secondary | ICD-10-CM | POA: Diagnosis not present

## 2022-07-31 DIAGNOSIS — Z992 Dependence on renal dialysis: Secondary | ICD-10-CM | POA: Diagnosis not present

## 2022-07-31 DIAGNOSIS — N2581 Secondary hyperparathyroidism of renal origin: Secondary | ICD-10-CM | POA: Diagnosis not present

## 2022-07-31 DIAGNOSIS — D689 Coagulation defect, unspecified: Secondary | ICD-10-CM | POA: Diagnosis not present

## 2022-07-31 DIAGNOSIS — N186 End stage renal disease: Secondary | ICD-10-CM | POA: Diagnosis not present

## 2022-07-31 DIAGNOSIS — E875 Hyperkalemia: Secondary | ICD-10-CM | POA: Diagnosis not present

## 2022-08-01 ENCOUNTER — Telehealth: Payer: Medicare Other

## 2022-08-02 DIAGNOSIS — N186 End stage renal disease: Secondary | ICD-10-CM | POA: Diagnosis not present

## 2022-08-02 DIAGNOSIS — E875 Hyperkalemia: Secondary | ICD-10-CM | POA: Diagnosis not present

## 2022-08-02 DIAGNOSIS — D689 Coagulation defect, unspecified: Secondary | ICD-10-CM | POA: Diagnosis not present

## 2022-08-02 DIAGNOSIS — Z992 Dependence on renal dialysis: Secondary | ICD-10-CM | POA: Diagnosis not present

## 2022-08-02 DIAGNOSIS — N2581 Secondary hyperparathyroidism of renal origin: Secondary | ICD-10-CM | POA: Diagnosis not present

## 2022-08-05 DIAGNOSIS — Z992 Dependence on renal dialysis: Secondary | ICD-10-CM | POA: Diagnosis not present

## 2022-08-05 DIAGNOSIS — E875 Hyperkalemia: Secondary | ICD-10-CM | POA: Diagnosis not present

## 2022-08-05 DIAGNOSIS — N2581 Secondary hyperparathyroidism of renal origin: Secondary | ICD-10-CM | POA: Diagnosis not present

## 2022-08-05 DIAGNOSIS — N186 End stage renal disease: Secondary | ICD-10-CM | POA: Diagnosis not present

## 2022-08-05 DIAGNOSIS — D689 Coagulation defect, unspecified: Secondary | ICD-10-CM | POA: Diagnosis not present

## 2022-08-07 DIAGNOSIS — Z992 Dependence on renal dialysis: Secondary | ICD-10-CM | POA: Diagnosis not present

## 2022-08-07 DIAGNOSIS — N2581 Secondary hyperparathyroidism of renal origin: Secondary | ICD-10-CM | POA: Diagnosis not present

## 2022-08-07 DIAGNOSIS — N186 End stage renal disease: Secondary | ICD-10-CM | POA: Diagnosis not present

## 2022-08-07 DIAGNOSIS — D689 Coagulation defect, unspecified: Secondary | ICD-10-CM | POA: Diagnosis not present

## 2022-08-07 DIAGNOSIS — E875 Hyperkalemia: Secondary | ICD-10-CM | POA: Diagnosis not present

## 2022-08-08 ENCOUNTER — Ambulatory Visit
Admission: RE | Admit: 2022-08-08 | Discharge: 2022-08-08 | Disposition: A | Discharge status: Home / Self Care | Payer: Medicare Other | Source: Ambulatory Visit | Attending: Nurse Practitioner | Admitting: Nurse Practitioner

## 2022-08-08 ENCOUNTER — Telehealth: Payer: Medicare Other

## 2022-08-08 DIAGNOSIS — Z720 Tobacco use: Secondary | ICD-10-CM

## 2022-08-08 DIAGNOSIS — F17210 Nicotine dependence, cigarettes, uncomplicated: Secondary | ICD-10-CM | POA: Diagnosis not present

## 2022-08-09 DIAGNOSIS — N2581 Secondary hyperparathyroidism of renal origin: Secondary | ICD-10-CM | POA: Diagnosis not present

## 2022-08-09 DIAGNOSIS — E875 Hyperkalemia: Secondary | ICD-10-CM | POA: Diagnosis not present

## 2022-08-09 DIAGNOSIS — N186 End stage renal disease: Secondary | ICD-10-CM | POA: Diagnosis not present

## 2022-08-09 DIAGNOSIS — D689 Coagulation defect, unspecified: Secondary | ICD-10-CM | POA: Diagnosis not present

## 2022-08-09 DIAGNOSIS — Z992 Dependence on renal dialysis: Secondary | ICD-10-CM | POA: Diagnosis not present

## 2022-08-12 ENCOUNTER — Encounter: Payer: Self-pay | Admitting: Nurse Practitioner

## 2022-08-12 DIAGNOSIS — N2581 Secondary hyperparathyroidism of renal origin: Secondary | ICD-10-CM | POA: Diagnosis not present

## 2022-08-12 DIAGNOSIS — Z992 Dependence on renal dialysis: Secondary | ICD-10-CM | POA: Diagnosis not present

## 2022-08-12 DIAGNOSIS — E875 Hyperkalemia: Secondary | ICD-10-CM | POA: Diagnosis not present

## 2022-08-12 DIAGNOSIS — N186 End stage renal disease: Secondary | ICD-10-CM | POA: Diagnosis not present

## 2022-08-12 DIAGNOSIS — D689 Coagulation defect, unspecified: Secondary | ICD-10-CM | POA: Diagnosis not present

## 2022-08-13 DIAGNOSIS — Z1211 Encounter for screening for malignant neoplasm of colon: Secondary | ICD-10-CM | POA: Diagnosis not present

## 2022-08-13 DIAGNOSIS — K760 Fatty (change of) liver, not elsewhere classified: Secondary | ICD-10-CM | POA: Diagnosis not present

## 2022-08-13 DIAGNOSIS — G4733 Obstructive sleep apnea (adult) (pediatric): Secondary | ICD-10-CM | POA: Diagnosis not present

## 2022-08-13 DIAGNOSIS — N184 Chronic kidney disease, stage 4 (severe): Secondary | ICD-10-CM | POA: Diagnosis not present

## 2022-08-13 DIAGNOSIS — Z85038 Personal history of other malignant neoplasm of large intestine: Secondary | ICD-10-CM | POA: Diagnosis not present

## 2022-08-13 DIAGNOSIS — J449 Chronic obstructive pulmonary disease, unspecified: Secondary | ICD-10-CM | POA: Diagnosis not present

## 2022-08-13 DIAGNOSIS — K5730 Diverticulosis of large intestine without perforation or abscess without bleeding: Secondary | ICD-10-CM | POA: Diagnosis not present

## 2022-08-14 DIAGNOSIS — Z992 Dependence on renal dialysis: Secondary | ICD-10-CM | POA: Diagnosis not present

## 2022-08-14 DIAGNOSIS — D689 Coagulation defect, unspecified: Secondary | ICD-10-CM | POA: Diagnosis not present

## 2022-08-14 DIAGNOSIS — N2581 Secondary hyperparathyroidism of renal origin: Secondary | ICD-10-CM | POA: Diagnosis not present

## 2022-08-14 DIAGNOSIS — N186 End stage renal disease: Secondary | ICD-10-CM | POA: Diagnosis not present

## 2022-08-14 DIAGNOSIS — E875 Hyperkalemia: Secondary | ICD-10-CM | POA: Diagnosis not present

## 2022-08-15 ENCOUNTER — Ambulatory Visit: Payer: Self-pay

## 2022-08-15 NOTE — Patient Outreach (Signed)
  Care Coordination   Follow Up Visit Note   08/15/2022 Name: Whitney Walker MRN: [id number] DOB: 08/31/1956  Whitney Walker is a 66 y.o. year old female who sees [name], FNP for primary care. I spoke with  Whitney Walker by phone today  What matters to the patients health and wellness today?  Patient needs to schedule her Colonoscopy when ready. She will consider getting established with a Pulmonologist to help manage her COPD.    Goals Addressed       Patient Stated     I need to schedule my colonoscopy (pt-stated)        Care Coordination Interventions: Evaluation of current treatment plan related to routine colonoscopy and patient's adherence to plan as established by provider Determined patient completed colon screening with Dr. Collene Mares Determined patient will be admitted into the hospital when ready to schedule to have her colonoscopy completed per Dr. Collene Mares due to having prior colon cancer Discussed patient plans to schedule once she returns from vacation with her family       I will consider seeing a Pulmonologist for my COPD (pt-stated)        Care Coordination Interventions: Provided patient with basic written and verbal COPD education on self care/management/and exacerbation prevention Provided instruction about proper use of medications used for management of COPD including inhalers Discussed consideration of a Pulmonary referral and offered to assist with referral placement Determined patient will contact PCP when ready for a referral to be placed     SDOH assessments and interventions completed:  Yes     Care Coordination Interventions Activated:  Yes  Care Coordination Interventions:  Yes, provided   Follow up plan: Follow up call scheduled for 10/15/22 '@09'$ :45 AM    Encounter Outcome:  Pt. Visit Completed

## 2022-08-15 NOTE — Progress Notes (Signed)
This encounter was created in error - please disregard.

## 2022-08-15 NOTE — Patient Instructions (Signed)
Visit Information  Thank you for taking time to visit with me today. Please don't hesitate to contact me if I can be of assistance to you.   Following are the goals we discussed today:   Goals Addressed       Patient Stated     I need to schedule my colonoscopy (pt-stated)        Care Coordination Interventions: Evaluation of current treatment plan related to routine colonoscopy and patient's adherence to plan as established by provider Determined patient completed colon screening with Dr. Collene Mares Determined patient will be admitted into the hospital when ready to schedule to have her colonoscopy completed per Dr. Collene Mares due to having prior colon cancer Discussed patient plans to schedule once she returns from vacation with her family       I will consider seeing a Pulmonologist for my COPD (pt-stated)        Care Coordination Interventions: Provided patient with basic written and verbal COPD education on self care/management/and exacerbation prevention Provided instruction about proper use of medications used for management of COPD including inhalers Discussed consideration of a Pulmonary referral and offered to assist with referral placement Determined patient will contact PCP when ready for a referral to be placed     Our next appointment is by telephone on 10/15/22 at 09:45 AM   Please call the care guide team at [phone] if you need to cancel or reschedule your appointment.   If you are experiencing a Mental Health or [place] or need someone to talk to, please call 1-800-273-TALK (toll free, 24 hour hotline)  Patient verbalizes understanding of instructions and care plan provided today and agrees to view in [facility]. Active MyChart status and patient understanding of how to access instructions and care plan via MyChart confirmed with patient.     [name], RN, BSN, CCM Care Management Coordinator [facility] Care Management  Direct Phone: [phone]

## 2022-08-16 DIAGNOSIS — E875 Hyperkalemia: Secondary | ICD-10-CM | POA: Diagnosis not present

## 2022-08-16 DIAGNOSIS — N186 End stage renal disease: Secondary | ICD-10-CM | POA: Diagnosis not present

## 2022-08-16 DIAGNOSIS — D689 Coagulation defect, unspecified: Secondary | ICD-10-CM | POA: Diagnosis not present

## 2022-08-16 DIAGNOSIS — Z992 Dependence on renal dialysis: Secondary | ICD-10-CM | POA: Diagnosis not present

## 2022-08-16 DIAGNOSIS — N2581 Secondary hyperparathyroidism of renal origin: Secondary | ICD-10-CM | POA: Diagnosis not present

## 2022-08-19 DIAGNOSIS — E875 Hyperkalemia: Secondary | ICD-10-CM | POA: Diagnosis not present

## 2022-08-19 DIAGNOSIS — N186 End stage renal disease: Secondary | ICD-10-CM | POA: Diagnosis not present

## 2022-08-19 DIAGNOSIS — D689 Coagulation defect, unspecified: Secondary | ICD-10-CM | POA: Diagnosis not present

## 2022-08-19 DIAGNOSIS — N2581 Secondary hyperparathyroidism of renal origin: Secondary | ICD-10-CM | POA: Diagnosis not present

## 2022-08-19 DIAGNOSIS — Z992 Dependence on renal dialysis: Secondary | ICD-10-CM | POA: Diagnosis not present

## 2022-08-20 ENCOUNTER — Ambulatory Visit: Payer: Medicare Other | Admitting: Student

## 2022-08-21 ENCOUNTER — Telehealth: Payer: Self-pay

## 2022-08-21 DIAGNOSIS — N2581 Secondary hyperparathyroidism of renal origin: Secondary | ICD-10-CM | POA: Diagnosis not present

## 2022-08-21 DIAGNOSIS — N186 End stage renal disease: Secondary | ICD-10-CM | POA: Diagnosis not present

## 2022-08-21 DIAGNOSIS — Z992 Dependence on renal dialysis: Secondary | ICD-10-CM | POA: Diagnosis not present

## 2022-08-21 DIAGNOSIS — E875 Hyperkalemia: Secondary | ICD-10-CM | POA: Diagnosis not present

## 2022-08-21 DIAGNOSIS — D689 Coagulation defect, unspecified: Secondary | ICD-10-CM | POA: Diagnosis not present

## 2022-08-21 NOTE — Telephone Encounter (Signed)
Patient needs refills of metoprolol sent to the pharmacy.

## 2022-08-21 NOTE — Progress Notes (Signed)
Chronic Care Management Pharmacy Assistant   Name: Whitney Walker  MRN: [id number] DOB: [birth year]  Reason for Encounter: Medication Review/ Medication coordination   Recent office visits:  08-15-2022 Little, Claudette Stapler, RN (CCM).  07-25-2022 [name], [place]. Patient reported not taking amlodipine, aspirin, carvedilol and flexeril.  Recent consult visits:  [name], MD [name]). Follow up visit.  Hospital visits:  Medication Reconciliation was completed by comparing discharge summary, patient's EMR and Pharmacy list, and upon discussion with patient.   Admitted to the hospital on 05-16-2022 due to a fall. Discharge date was 05-22-2022. Discharged from [place]?Medications Started at [facility] Discharge:?? Tylenol 650 mg every 8 hours as needed Ancef inject 50 mLs into vein daily for 8 days Colchicine 0.6 mg daily Voltaren apply 2 grams 4 times daily   Medication Changes at Hospital Discharge: None   Medications Discontinued at Hospital Discharge: Percocet Flexeril   Medications that remain the same after Hospital Discharge:??  -All other medications will remain the same.    Medications: Outpatient Encounter Medications as of 08/21/2022  Medication Sig   acetaminophen (TYLENOL) 325 MG tablet Take by mouth.   albuterol (VENTOLIN HFA) 108 (90 Base) MCG/ACT inhaler Inhale 2 puffs into the lungs every 6 (six) hours as needed for wheezing or shortness of breath.   apixaban (ELIQUIS) 5 MG TABS tablet Take 1 tablet (5 mg total) by mouth 2 (two) times daily.   Ascorbic Acid (VITAMIN C) 100 MG tablet Take 100 mg by mouth daily.   atorvastatin (LIPITOR) 10 MG tablet Take 10 mg by mouth daily.   AURYXIA 1 GM 210 MG(Fe) tablet Take 420-840 mg by mouth See admin instructions. Take '840mg'$  by mouth three times daily with meals and '420mg'$  with snacks.   Biotin 5000 MCG SUBL Place under the tongue.   diclofenac Sodium (VOLTAREN) 1 % GEL Apply topically.    diphenhydrAMINE (BANOPHEN) 25 MG tablet TAKE TWO TABLETS ('50mg'$  total) BY MOUTH ONCE FOR ONE DOSE AT 9AM ON 6/2 (Patient not taking: Reported on 07/04/2022)   Doxercalciferol (HECTOROL IV) Doxercalciferol (Hectorol) (Patient not taking: Reported on 07/04/2022)   doxycycline (VIBRA-TABS) 100 MG tablet TAKE ONE TABLET BY MOUTH TWICE DAILY DO not take with binders (Patient not taking: Reported on 07/04/2022)   ezetimibe (ZETIA) 10 MG tablet Take 1 tablet by mouth daily. (Patient not taking: Reported on 07/04/2022)   fluticasone (FLONASE) 50 MCG/ACT nasal spray Place 2 sprays into both nostrils daily.   gabapentin (NEURONTIN) 300 MG capsule Take by mouth. (Patient not taking: Reported on 07/04/2022)   ketorolac (ACULAR) 0.5 % ophthalmic solution instill ONE drop into LEFT EYE four times daily STARTING ONE DAY BEFORE surgery   meclizine (ANTIVERT) 12.5 MG tablet Take 1 tablet (12.5 mg total) by mouth 3 (three) times daily as needed for dizziness.   melatonin 3 MG TABS tablet TAKE ONE TABLET BY MOUTH EVERYDAY AT BEDTIME AS NEEDED (Patient not taking: Reported on 07/04/2022)   midodrine (PROAMATINE) 10 MG tablet Take 5 mg by mouth every Monday, Wednesday, and Friday with hemodialysis. (Patient not taking: Reported on 06/18/2022)   ofloxacin (OCUFLOX) 0.3 % ophthalmic solution instill ONE drop into THE LEFT EYE four times daily STARTING one DAY BEFORE surgery   omeprazole (PRILOSEC) 20 MG capsule Take 1 capsule (20 mg total) by mouth daily.   predniSONE (DELTASONE) 20 MG tablet TAKE TWO TABLETS BY MOUTH AT 7pm A DAY prior TO procedue and TAKE TWO TABLETS  BY MOUTH AT 11pm (tonight) and TAKE TWO TABLETS BY MOUTH AT 7am (Patient not taking: Reported on 07/04/2022)   vitamin E 200 UNIT capsule Take 200 Units by mouth daily.   No facility-administered encounter medications on file as of 08/21/2022.  Reviewed chart for medication changes ahead of medication coordination call.  No medication changes indicated   BP Readings from  Last 3 Encounters:  07/25/22 138/64  07/04/22 121/80  05/30/22 110/60    Lab Results  Component Value Date   HGBA1C 5.9 (H) 11/15/2021     Patient obtains medications through [facility]  90 Days   Last adherence delivery included:  Metoprolol 25 mg daily Albuterol (Sent refill request to PCP)  Patient declined (meds) last month: Omeprazole- plenty supply Atorvastatin- plenty supply Flonase- plenty supply  Patient is due for next adherence delivery on: 09-02-2022  Called patient and reviewed medications and coordinated delivery.  This delivery to include: Metoprolol 25 mg daily Albuterol inhaler 2 puffs every 6 hours PRN Omeprazole 20 mg daily Atorvastatin 10 mg daily  No short/acute fill needed  Patient declined the following medications: Midodrine- D/C Flonase- Plenty supply  Patient needs refills for: Sent to PCP Metoprolol  Confirmed delivery date of 09-02-2022 advised patient that pharmacy will contact them the morning of delivery.   Care Gaps: Colonoscopy overdue Tdap overdue Flu vaccine overdue Covid booster overdue  Star Rating Drugs: Atorvastatin 10 mg- Last filled   [place] Clinical Pharmacist Assistant [phone]

## 2022-08-23 DIAGNOSIS — D689 Coagulation defect, unspecified: Secondary | ICD-10-CM | POA: Diagnosis not present

## 2022-08-23 DIAGNOSIS — E875 Hyperkalemia: Secondary | ICD-10-CM | POA: Diagnosis not present

## 2022-08-23 DIAGNOSIS — N186 End stage renal disease: Secondary | ICD-10-CM | POA: Diagnosis not present

## 2022-08-23 DIAGNOSIS — Z992 Dependence on renal dialysis: Secondary | ICD-10-CM | POA: Diagnosis not present

## 2022-08-23 DIAGNOSIS — N2581 Secondary hyperparathyroidism of renal origin: Secondary | ICD-10-CM | POA: Diagnosis not present

## 2022-08-26 DIAGNOSIS — E875 Hyperkalemia: Secondary | ICD-10-CM | POA: Diagnosis not present

## 2022-08-26 DIAGNOSIS — Z992 Dependence on renal dialysis: Secondary | ICD-10-CM | POA: Diagnosis not present

## 2022-08-26 DIAGNOSIS — N186 End stage renal disease: Secondary | ICD-10-CM | POA: Diagnosis not present

## 2022-08-26 DIAGNOSIS — D689 Coagulation defect, unspecified: Secondary | ICD-10-CM | POA: Diagnosis not present

## 2022-08-26 DIAGNOSIS — N2581 Secondary hyperparathyroidism of renal origin: Secondary | ICD-10-CM | POA: Diagnosis not present

## 2022-08-27 ENCOUNTER — Other Ambulatory Visit: Payer: Self-pay | Admitting: Nurse Practitioner

## 2022-08-27 MED ORDER — METOPROLOL SUCCINATE ER 25 MG PO TB24: 25.0000 mg | ORAL_TABLET | Freq: Every day | ORAL | 1 refills | Status: DC

## 2022-08-28 DIAGNOSIS — E875 Hyperkalemia: Secondary | ICD-10-CM | POA: Diagnosis not present

## 2022-08-28 DIAGNOSIS — Z992 Dependence on renal dialysis: Secondary | ICD-10-CM | POA: Diagnosis not present

## 2022-08-28 DIAGNOSIS — D689 Coagulation defect, unspecified: Secondary | ICD-10-CM | POA: Diagnosis not present

## 2022-08-28 DIAGNOSIS — N186 End stage renal disease: Secondary | ICD-10-CM | POA: Diagnosis not present

## 2022-08-28 DIAGNOSIS — N2581 Secondary hyperparathyroidism of renal origin: Secondary | ICD-10-CM | POA: Diagnosis not present

## 2022-08-29 DIAGNOSIS — N186 End stage renal disease: Secondary | ICD-10-CM | POA: Diagnosis not present

## 2022-08-29 DIAGNOSIS — I129 Hypertensive chronic kidney disease with stage 1 through stage 4 chronic kidney disease, or unspecified chronic kidney disease: Secondary | ICD-10-CM | POA: Diagnosis not present

## 2022-08-29 DIAGNOSIS — Z992 Dependence on renal dialysis: Secondary | ICD-10-CM | POA: Diagnosis not present

## 2022-08-30 DIAGNOSIS — N2581 Secondary hyperparathyroidism of renal origin: Secondary | ICD-10-CM | POA: Diagnosis not present

## 2022-08-30 DIAGNOSIS — Z992 Dependence on renal dialysis: Secondary | ICD-10-CM | POA: Diagnosis not present

## 2022-08-30 DIAGNOSIS — A498 Other bacterial infections of unspecified site: Secondary | ICD-10-CM | POA: Diagnosis not present

## 2022-08-30 DIAGNOSIS — D689 Coagulation defect, unspecified: Secondary | ICD-10-CM | POA: Diagnosis not present

## 2022-08-30 DIAGNOSIS — E875 Hyperkalemia: Secondary | ICD-10-CM | POA: Diagnosis not present

## 2022-08-30 DIAGNOSIS — N186 End stage renal disease: Secondary | ICD-10-CM | POA: Diagnosis not present

## 2022-09-02 DIAGNOSIS — A498 Other bacterial infections of unspecified site: Secondary | ICD-10-CM | POA: Diagnosis not present

## 2022-09-02 DIAGNOSIS — E875 Hyperkalemia: Secondary | ICD-10-CM | POA: Diagnosis not present

## 2022-09-02 DIAGNOSIS — D689 Coagulation defect, unspecified: Secondary | ICD-10-CM | POA: Diagnosis not present

## 2022-09-02 DIAGNOSIS — Z992 Dependence on renal dialysis: Secondary | ICD-10-CM | POA: Diagnosis not present

## 2022-09-02 DIAGNOSIS — N186 End stage renal disease: Secondary | ICD-10-CM | POA: Diagnosis not present

## 2022-09-02 DIAGNOSIS — N2581 Secondary hyperparathyroidism of renal origin: Secondary | ICD-10-CM | POA: Diagnosis not present

## 2022-09-04 DIAGNOSIS — D689 Coagulation defect, unspecified: Secondary | ICD-10-CM | POA: Diagnosis not present

## 2022-09-04 DIAGNOSIS — E875 Hyperkalemia: Secondary | ICD-10-CM | POA: Diagnosis not present

## 2022-09-04 DIAGNOSIS — A498 Other bacterial infections of unspecified site: Secondary | ICD-10-CM | POA: Diagnosis not present

## 2022-09-04 DIAGNOSIS — N2581 Secondary hyperparathyroidism of renal origin: Secondary | ICD-10-CM | POA: Diagnosis not present

## 2022-09-04 DIAGNOSIS — N186 End stage renal disease: Secondary | ICD-10-CM | POA: Diagnosis not present

## 2022-09-04 DIAGNOSIS — Z992 Dependence on renal dialysis: Secondary | ICD-10-CM | POA: Diagnosis not present

## 2022-09-05 ENCOUNTER — Other Ambulatory Visit: Payer: Self-pay | Admitting: Gastroenterology

## 2022-09-06 ENCOUNTER — Encounter: Payer: Self-pay | Admitting: Nurse Practitioner

## 2022-09-06 DIAGNOSIS — A498 Other bacterial infections of unspecified site: Secondary | ICD-10-CM | POA: Diagnosis not present

## 2022-09-06 DIAGNOSIS — N186 End stage renal disease: Secondary | ICD-10-CM | POA: Diagnosis not present

## 2022-09-06 DIAGNOSIS — D689 Coagulation defect, unspecified: Secondary | ICD-10-CM | POA: Diagnosis not present

## 2022-09-06 DIAGNOSIS — Z992 Dependence on renal dialysis: Secondary | ICD-10-CM | POA: Diagnosis not present

## 2022-09-06 DIAGNOSIS — N2581 Secondary hyperparathyroidism of renal origin: Secondary | ICD-10-CM | POA: Diagnosis not present

## 2022-09-06 DIAGNOSIS — E875 Hyperkalemia: Secondary | ICD-10-CM | POA: Diagnosis not present

## 2022-09-09 ENCOUNTER — Other Ambulatory Visit: Payer: Self-pay | Admitting: Nurse Practitioner

## 2022-09-09 DIAGNOSIS — N2581 Secondary hyperparathyroidism of renal origin: Secondary | ICD-10-CM | POA: Diagnosis not present

## 2022-09-09 DIAGNOSIS — Z992 Dependence on renal dialysis: Secondary | ICD-10-CM | POA: Diagnosis not present

## 2022-09-09 DIAGNOSIS — E875 Hyperkalemia: Secondary | ICD-10-CM | POA: Diagnosis not present

## 2022-09-09 DIAGNOSIS — A498 Other bacterial infections of unspecified site: Secondary | ICD-10-CM | POA: Diagnosis not present

## 2022-09-09 DIAGNOSIS — N186 End stage renal disease: Secondary | ICD-10-CM | POA: Diagnosis not present

## 2022-09-09 DIAGNOSIS — D689 Coagulation defect, unspecified: Secondary | ICD-10-CM | POA: Diagnosis not present

## 2022-09-09 MED ORDER — OMEPRAZOLE 20 MG PO CPDR: 20.0000 mg | DELAYED_RELEASE_CAPSULE | Freq: Every day | ORAL | 1 refills | Status: DC

## 2022-09-11 DIAGNOSIS — N2581 Secondary hyperparathyroidism of renal origin: Secondary | ICD-10-CM | POA: Diagnosis not present

## 2022-09-11 DIAGNOSIS — D689 Coagulation defect, unspecified: Secondary | ICD-10-CM | POA: Diagnosis not present

## 2022-09-11 DIAGNOSIS — A498 Other bacterial infections of unspecified site: Secondary | ICD-10-CM | POA: Diagnosis not present

## 2022-09-11 DIAGNOSIS — E875 Hyperkalemia: Secondary | ICD-10-CM | POA: Diagnosis not present

## 2022-09-11 DIAGNOSIS — Z992 Dependence on renal dialysis: Secondary | ICD-10-CM | POA: Diagnosis not present

## 2022-09-11 DIAGNOSIS — N186 End stage renal disease: Secondary | ICD-10-CM | POA: Diagnosis not present

## 2022-09-13 DIAGNOSIS — D689 Coagulation defect, unspecified: Secondary | ICD-10-CM | POA: Diagnosis not present

## 2022-09-13 DIAGNOSIS — N2581 Secondary hyperparathyroidism of renal origin: Secondary | ICD-10-CM | POA: Diagnosis not present

## 2022-09-13 DIAGNOSIS — Z992 Dependence on renal dialysis: Secondary | ICD-10-CM | POA: Diagnosis not present

## 2022-09-13 DIAGNOSIS — N186 End stage renal disease: Secondary | ICD-10-CM | POA: Diagnosis not present

## 2022-09-13 DIAGNOSIS — A498 Other bacterial infections of unspecified site: Secondary | ICD-10-CM | POA: Diagnosis not present

## 2022-09-13 DIAGNOSIS — E875 Hyperkalemia: Secondary | ICD-10-CM | POA: Diagnosis not present

## 2022-09-16 DIAGNOSIS — Z992 Dependence on renal dialysis: Secondary | ICD-10-CM | POA: Diagnosis not present

## 2022-09-16 DIAGNOSIS — D689 Coagulation defect, unspecified: Secondary | ICD-10-CM | POA: Diagnosis not present

## 2022-09-16 DIAGNOSIS — A498 Other bacterial infections of unspecified site: Secondary | ICD-10-CM | POA: Diagnosis not present

## 2022-09-16 DIAGNOSIS — N2581 Secondary hyperparathyroidism of renal origin: Secondary | ICD-10-CM | POA: Diagnosis not present

## 2022-09-16 DIAGNOSIS — N186 End stage renal disease: Secondary | ICD-10-CM | POA: Diagnosis not present

## 2022-09-16 DIAGNOSIS — E875 Hyperkalemia: Secondary | ICD-10-CM | POA: Diagnosis not present

## 2022-09-18 DIAGNOSIS — E875 Hyperkalemia: Secondary | ICD-10-CM | POA: Diagnosis not present

## 2022-09-18 DIAGNOSIS — N186 End stage renal disease: Secondary | ICD-10-CM | POA: Diagnosis not present

## 2022-09-18 DIAGNOSIS — A498 Other bacterial infections of unspecified site: Secondary | ICD-10-CM | POA: Diagnosis not present

## 2022-09-18 DIAGNOSIS — N2581 Secondary hyperparathyroidism of renal origin: Secondary | ICD-10-CM | POA: Diagnosis not present

## 2022-09-18 DIAGNOSIS — Z992 Dependence on renal dialysis: Secondary | ICD-10-CM | POA: Diagnosis not present

## 2022-09-18 DIAGNOSIS — D689 Coagulation defect, unspecified: Secondary | ICD-10-CM | POA: Diagnosis not present

## 2022-09-19 ENCOUNTER — Other Ambulatory Visit: Payer: Self-pay

## 2022-09-19 ENCOUNTER — Telehealth: Payer: Self-pay

## 2022-09-19 MED ORDER — APIXABAN 5 MG PO TABS: 5.0000 mg | ORAL_TABLET | Freq: Two times a day (BID) | ORAL | 2 refills | Status: DC

## 2022-09-19 MED ORDER — ALBUTEROL SULFATE HFA 108 (90 BASE) MCG/ACT IN AERS: 2.0000 | INHALATION_SPRAY | Freq: Four times a day (QID) | RESPIRATORY_TRACT | 2 refills | Status: DC | PRN

## 2022-09-19 NOTE — Chronic Care Management (AMB) (Signed)
Chronic Care Management Pharmacy Assistant   Name: Whitney Walker  MRN: [id number] DOB: 07-May-1956  Reason for Encounter: Medication Review/ Medication coordination  Recent office visits:  None  Recent consult visits:  None  Hospital visits:  Medication Reconciliation was completed by comparing discharge summary, patient's EMR and Pharmacy list, and upon discussion with patient.   Admitted to the hospital on 05-16-2022 due to a fall. Discharge date was 05-22-2022. Discharged from [place]?Medications Started at [facility] Discharge:?? Tylenol 650 mg every 8 hours as needed Ancef inject 50 mLs into vein daily for 8 days Colchicine 0.6 mg daily Voltaren apply 2 grams 4 times daily   Medication Changes at Hospital Discharge: None   Medications Discontinued at Hospital Discharge: Percocet Flexeril   Medications that remain the same after Hospital Discharge:??  -All other medications will remain the same.    Medications: Outpatient Encounter Medications as of 09/19/2022  Medication Sig   acetaminophen (TYLENOL) 325 MG tablet Take by mouth.   albuterol (VENTOLIN HFA) 108 (90 Base) MCG/ACT inhaler Inhale 2 puffs into the lungs every 6 (six) hours as needed for wheezing or shortness of breath.   apixaban (ELIQUIS) 5 MG TABS tablet Take 1 tablet (5 mg total) by mouth 2 (two) times daily.   Ascorbic Acid (VITAMIN C) 100 MG tablet Take 100 mg by mouth daily.   atorvastatin (LIPITOR) 10 MG tablet Take 10 mg by mouth daily.   AURYXIA 1 GM 210 MG(Fe) tablet Take 420-840 mg by mouth See admin instructions. Take '840mg'$  by mouth three times daily with meals and '420mg'$  with snacks.   Biotin 5000 MCG SUBL Place under the tongue.   diclofenac Sodium (VOLTAREN) 1 % GEL Apply topically.   diphenhydrAMINE (BANOPHEN) 25 MG tablet TAKE TWO TABLETS ('50mg'$  total) BY MOUTH ONCE FOR ONE DOSE AT 9AM ON 6/2 (Patient not taking: Reported on 07/04/2022)   Doxercalciferol (HECTOROL  IV) Doxercalciferol (Hectorol) (Patient not taking: Reported on 07/04/2022)   doxycycline (VIBRA-TABS) 100 MG tablet TAKE ONE TABLET BY MOUTH TWICE DAILY DO not take with binders (Patient not taking: Reported on 07/04/2022)   ezetimibe (ZETIA) 10 MG tablet Take 1 tablet by mouth daily. (Patient not taking: Reported on 07/04/2022)   fluticasone (FLONASE) 50 MCG/ACT nasal spray Place 2 sprays into both nostrils daily.   gabapentin (NEURONTIN) 300 MG capsule Take by mouth. (Patient not taking: Reported on 07/04/2022)   ketorolac (ACULAR) 0.5 % ophthalmic solution instill ONE drop into LEFT EYE four times daily STARTING ONE DAY BEFORE surgery   meclizine (ANTIVERT) 12.5 MG tablet Take 1 tablet (12.5 mg total) by mouth 3 (three) times daily as needed for dizziness.   melatonin 3 MG TABS tablet TAKE ONE TABLET BY MOUTH EVERYDAY AT BEDTIME AS NEEDED (Patient not taking: Reported on 07/04/2022)   metoprolol succinate (TOPROL-XL) 25 MG 24 hr tablet Take 1 tablet (25 mg total) by mouth daily.   midodrine (PROAMATINE) 10 MG tablet Take 5 mg by mouth every Monday, Wednesday, and Friday with hemodialysis. (Patient not taking: Reported on 06/18/2022)   ofloxacin (OCUFLOX) 0.3 % ophthalmic solution instill ONE drop into THE LEFT EYE four times daily STARTING one DAY BEFORE surgery   omeprazole (PRILOSEC) 20 MG capsule Take 1 capsule (20 mg total) by mouth daily.   predniSONE (DELTASONE) 20 MG tablet TAKE TWO TABLETS BY MOUTH AT 7pm A DAY prior TO procedue and TAKE TWO TABLETS BY MOUTH AT 11pm (tonight) and TAKE  TWO TABLETS BY MOUTH AT 7am (Patient not taking: Reported on 07/04/2022)   vitamin E 200 UNIT capsule Take 200 Units by mouth daily.   No facility-administered encounter medications on file as of 09/19/2022.  Reviewed chart for medication changes ahead of medication coordination call.  No medication changes indicated  BP Readings from Last 3 Encounters:  07/25/22 138/64  07/04/22 121/80  05/30/22 110/60    Lab  Results  Component Value Date   HGBA1C 5.9 (H) 11/15/2021     Patient obtains medications through Adherence Packaging  90 Days   Last adherence delivery included:  Metoprolol 25 mg daily Albuterol inhaler 2 puffs every 6 hours PRN Omeprazole 20 mg daily Atorvastatin 10 mg daily  Patient declined (meds) last month: Midodrine- D/C Flonase- Plenty supply  Patient is due for next adherence delivery on: 10-01-2022  Called patient and reviewed medications and coordinated delivery.  This delivery to include: Patient declined Eliquis 5 mg twice daily Albuterol inhaler 2 puffs every 6 hours as needed Omeprazole 20 mg daily  No acute/short fill needed  Patient declined the following medications: Eliquis 5 mg twice daily- Patient stated she has plenty supply Albuterol inhaler 2 puffs every 6 hours as needed- plenty supply Omeprazole 20 mg daily- Plenty supply  Patient needs refills for: Sent request Eliquis Albuterol inhaler  Confirmed patient doesn't need delivery this month.   Care Gaps: Colonoscopy overdue Tdap overdue Flu vaccine overdue Covid booster overdue  Star Rating Drugs: Atorvastatin 10 mg- Last filled 08-27-2022 [facility]  [place] Clinical Pharmacist Assistant [phone]

## 2022-09-20 DIAGNOSIS — N2581 Secondary hyperparathyroidism of renal origin: Secondary | ICD-10-CM | POA: Diagnosis not present

## 2022-09-20 DIAGNOSIS — N186 End stage renal disease: Secondary | ICD-10-CM | POA: Diagnosis not present

## 2022-09-20 DIAGNOSIS — D689 Coagulation defect, unspecified: Secondary | ICD-10-CM | POA: Diagnosis not present

## 2022-09-20 DIAGNOSIS — Z992 Dependence on renal dialysis: Secondary | ICD-10-CM | POA: Diagnosis not present

## 2022-09-20 DIAGNOSIS — A498 Other bacterial infections of unspecified site: Secondary | ICD-10-CM | POA: Diagnosis not present

## 2022-09-20 DIAGNOSIS — E875 Hyperkalemia: Secondary | ICD-10-CM | POA: Diagnosis not present

## 2022-09-23 DIAGNOSIS — D689 Coagulation defect, unspecified: Secondary | ICD-10-CM | POA: Diagnosis not present

## 2022-09-23 DIAGNOSIS — N2581 Secondary hyperparathyroidism of renal origin: Secondary | ICD-10-CM | POA: Diagnosis not present

## 2022-09-23 DIAGNOSIS — A498 Other bacterial infections of unspecified site: Secondary | ICD-10-CM | POA: Diagnosis not present

## 2022-09-23 DIAGNOSIS — E875 Hyperkalemia: Secondary | ICD-10-CM | POA: Diagnosis not present

## 2022-09-23 DIAGNOSIS — N186 End stage renal disease: Secondary | ICD-10-CM | POA: Diagnosis not present

## 2022-09-23 DIAGNOSIS — Z992 Dependence on renal dialysis: Secondary | ICD-10-CM | POA: Diagnosis not present

## 2022-09-24 ENCOUNTER — Telehealth: Payer: Self-pay

## 2022-09-24 NOTE — Telephone Encounter (Signed)
Called patient to follow up on medication adherence. Left non specific voicemail.  Whitney Walker, CPP, PharmD Clinical Pharmacist Practitioner [facility] [phone]

## 2022-09-25 ENCOUNTER — Other Ambulatory Visit: Payer: Self-pay | Admitting: Nurse Practitioner

## 2022-09-25 DIAGNOSIS — Z992 Dependence on renal dialysis: Secondary | ICD-10-CM | POA: Diagnosis not present

## 2022-09-25 DIAGNOSIS — A498 Other bacterial infections of unspecified site: Secondary | ICD-10-CM | POA: Diagnosis not present

## 2022-09-25 DIAGNOSIS — D689 Coagulation defect, unspecified: Secondary | ICD-10-CM | POA: Diagnosis not present

## 2022-09-25 DIAGNOSIS — E875 Hyperkalemia: Secondary | ICD-10-CM | POA: Diagnosis not present

## 2022-09-25 DIAGNOSIS — N186 End stage renal disease: Secondary | ICD-10-CM | POA: Diagnosis not present

## 2022-09-25 DIAGNOSIS — N2581 Secondary hyperparathyroidism of renal origin: Secondary | ICD-10-CM | POA: Diagnosis not present

## 2022-09-25 MED ORDER — ATORVASTATIN CALCIUM 20 MG PO TABS: 10.0000 mg | ORAL_TABLET | Freq: Every day | ORAL | 1 refills | Status: DC

## 2022-09-27 DIAGNOSIS — N186 End stage renal disease: Secondary | ICD-10-CM | POA: Diagnosis not present

## 2022-09-27 DIAGNOSIS — E875 Hyperkalemia: Secondary | ICD-10-CM | POA: Diagnosis not present

## 2022-09-27 DIAGNOSIS — N2581 Secondary hyperparathyroidism of renal origin: Secondary | ICD-10-CM | POA: Diagnosis not present

## 2022-09-27 DIAGNOSIS — A498 Other bacterial infections of unspecified site: Secondary | ICD-10-CM | POA: Diagnosis not present

## 2022-09-27 DIAGNOSIS — Z992 Dependence on renal dialysis: Secondary | ICD-10-CM | POA: Diagnosis not present

## 2022-09-27 DIAGNOSIS — D689 Coagulation defect, unspecified: Secondary | ICD-10-CM | POA: Diagnosis not present

## 2022-09-27 NOTE — Telephone Encounter (Signed)
error 

## 2022-09-28 DIAGNOSIS — N186 End stage renal disease: Secondary | ICD-10-CM | POA: Diagnosis not present

## 2022-09-28 DIAGNOSIS — I129 Hypertensive chronic kidney disease with stage 1 through stage 4 chronic kidney disease, or unspecified chronic kidney disease: Secondary | ICD-10-CM | POA: Diagnosis not present

## 2022-09-28 DIAGNOSIS — Z992 Dependence on renal dialysis: Secondary | ICD-10-CM | POA: Diagnosis not present

## 2022-09-30 ENCOUNTER — Telehealth: Payer: Self-pay

## 2022-09-30 DIAGNOSIS — N2581 Secondary hyperparathyroidism of renal origin: Secondary | ICD-10-CM | POA: Diagnosis not present

## 2022-09-30 DIAGNOSIS — D689 Coagulation defect, unspecified: Secondary | ICD-10-CM | POA: Diagnosis not present

## 2022-09-30 DIAGNOSIS — Z992 Dependence on renal dialysis: Secondary | ICD-10-CM | POA: Diagnosis not present

## 2022-09-30 DIAGNOSIS — N186 End stage renal disease: Secondary | ICD-10-CM | POA: Diagnosis not present

## 2022-09-30 DIAGNOSIS — Z23 Encounter for immunization: Secondary | ICD-10-CM | POA: Diagnosis not present

## 2022-09-30 DIAGNOSIS — T8249XD Other complication of vascular dialysis catheter, subsequent encounter: Secondary | ICD-10-CM | POA: Diagnosis not present

## 2022-09-30 DIAGNOSIS — T82594A Other mechanical complication of infusion catheter, initial encounter: Secondary | ICD-10-CM | POA: Diagnosis not present

## 2022-09-30 DIAGNOSIS — E875 Hyperkalemia: Secondary | ICD-10-CM | POA: Diagnosis not present

## 2022-09-30 NOTE — Telephone Encounter (Signed)
Called to schedule appt for clearance for colonoscopy. Pt refused to schedule and stated that she would cancel colonoscopy

## 2022-10-02 DIAGNOSIS — T82594A Other mechanical complication of infusion catheter, initial encounter: Secondary | ICD-10-CM | POA: Diagnosis not present

## 2022-10-02 DIAGNOSIS — E875 Hyperkalemia: Secondary | ICD-10-CM | POA: Diagnosis not present

## 2022-10-02 DIAGNOSIS — N186 End stage renal disease: Secondary | ICD-10-CM | POA: Diagnosis not present

## 2022-10-02 DIAGNOSIS — N2581 Secondary hyperparathyroidism of renal origin: Secondary | ICD-10-CM | POA: Diagnosis not present

## 2022-10-02 DIAGNOSIS — Z23 Encounter for immunization: Secondary | ICD-10-CM | POA: Diagnosis not present

## 2022-10-02 DIAGNOSIS — D689 Coagulation defect, unspecified: Secondary | ICD-10-CM | POA: Diagnosis not present

## 2022-10-02 DIAGNOSIS — T8249XD Other complication of vascular dialysis catheter, subsequent encounter: Secondary | ICD-10-CM | POA: Diagnosis not present

## 2022-10-02 DIAGNOSIS — Z992 Dependence on renal dialysis: Secondary | ICD-10-CM | POA: Diagnosis not present

## 2022-10-03 ENCOUNTER — Other Ambulatory Visit: Payer: Self-pay

## 2022-10-04 DIAGNOSIS — D689 Coagulation defect, unspecified: Secondary | ICD-10-CM | POA: Diagnosis not present

## 2022-10-04 DIAGNOSIS — Z992 Dependence on renal dialysis: Secondary | ICD-10-CM | POA: Diagnosis not present

## 2022-10-04 DIAGNOSIS — E875 Hyperkalemia: Secondary | ICD-10-CM | POA: Diagnosis not present

## 2022-10-04 DIAGNOSIS — Z23 Encounter for immunization: Secondary | ICD-10-CM | POA: Diagnosis not present

## 2022-10-04 DIAGNOSIS — N2581 Secondary hyperparathyroidism of renal origin: Secondary | ICD-10-CM | POA: Diagnosis not present

## 2022-10-04 DIAGNOSIS — N186 End stage renal disease: Secondary | ICD-10-CM | POA: Diagnosis not present

## 2022-10-04 DIAGNOSIS — T82594A Other mechanical complication of infusion catheter, initial encounter: Secondary | ICD-10-CM | POA: Diagnosis not present

## 2022-10-04 DIAGNOSIS — T8249XD Other complication of vascular dialysis catheter, subsequent encounter: Secondary | ICD-10-CM | POA: Diagnosis not present

## 2022-10-07 DIAGNOSIS — N2581 Secondary hyperparathyroidism of renal origin: Secondary | ICD-10-CM | POA: Diagnosis not present

## 2022-10-07 DIAGNOSIS — T82594A Other mechanical complication of infusion catheter, initial encounter: Secondary | ICD-10-CM | POA: Diagnosis not present

## 2022-10-07 DIAGNOSIS — N186 End stage renal disease: Secondary | ICD-10-CM | POA: Diagnosis not present

## 2022-10-07 DIAGNOSIS — D689 Coagulation defect, unspecified: Secondary | ICD-10-CM | POA: Diagnosis not present

## 2022-10-07 DIAGNOSIS — T8249XD Other complication of vascular dialysis catheter, subsequent encounter: Secondary | ICD-10-CM | POA: Diagnosis not present

## 2022-10-07 DIAGNOSIS — Z23 Encounter for immunization: Secondary | ICD-10-CM | POA: Diagnosis not present

## 2022-10-07 DIAGNOSIS — Z992 Dependence on renal dialysis: Secondary | ICD-10-CM | POA: Diagnosis not present

## 2022-10-07 DIAGNOSIS — E875 Hyperkalemia: Secondary | ICD-10-CM | POA: Diagnosis not present

## 2022-10-08 ENCOUNTER — Encounter: Payer: Self-pay | Admitting: Nurse Practitioner

## 2022-10-08 ENCOUNTER — Encounter: Payer: Medicare Other | Admitting: Nurse Practitioner

## 2022-10-08 ENCOUNTER — Other Ambulatory Visit: Payer: Self-pay

## 2022-10-08 ENCOUNTER — Telehealth: Payer: Self-pay

## 2022-10-08 NOTE — Progress Notes (Signed)
Left without being seen inspite of need to be evaluated for her colonoscopy and referral to pulmonology.

## 2022-10-08 NOTE — Telephone Encounter (Signed)
Patient against medical advice. She left before being seen stating her Melburn Popper was on the way, she did not want to wait any longer.

## 2022-10-09 DIAGNOSIS — T8249XD Other complication of vascular dialysis catheter, subsequent encounter: Secondary | ICD-10-CM | POA: Diagnosis not present

## 2022-10-09 DIAGNOSIS — T82594A Other mechanical complication of infusion catheter, initial encounter: Secondary | ICD-10-CM | POA: Diagnosis not present

## 2022-10-09 DIAGNOSIS — D689 Coagulation defect, unspecified: Secondary | ICD-10-CM | POA: Diagnosis not present

## 2022-10-09 DIAGNOSIS — Z23 Encounter for immunization: Secondary | ICD-10-CM | POA: Diagnosis not present

## 2022-10-09 DIAGNOSIS — E875 Hyperkalemia: Secondary | ICD-10-CM | POA: Diagnosis not present

## 2022-10-09 DIAGNOSIS — N2581 Secondary hyperparathyroidism of renal origin: Secondary | ICD-10-CM | POA: Diagnosis not present

## 2022-10-09 DIAGNOSIS — Z992 Dependence on renal dialysis: Secondary | ICD-10-CM | POA: Diagnosis not present

## 2022-10-09 DIAGNOSIS — N186 End stage renal disease: Secondary | ICD-10-CM | POA: Diagnosis not present

## 2022-10-10 ENCOUNTER — Encounter: Payer: Self-pay | Admitting: Hematology

## 2022-10-10 ENCOUNTER — Telehealth: Payer: Self-pay

## 2022-10-10 NOTE — Telephone Encounter (Addendum)
Called patient regarding her concern that she couldn't see her appointments for next year. This LPN did confirm she has a lab appointment and MD visit with Dr. Burr Medico on 05/22/2023. Patient verbalized understanding.

## 2022-10-11 DIAGNOSIS — Z23 Encounter for immunization: Secondary | ICD-10-CM | POA: Diagnosis not present

## 2022-10-11 DIAGNOSIS — E875 Hyperkalemia: Secondary | ICD-10-CM | POA: Diagnosis not present

## 2022-10-11 DIAGNOSIS — T82594A Other mechanical complication of infusion catheter, initial encounter: Secondary | ICD-10-CM | POA: Diagnosis not present

## 2022-10-11 DIAGNOSIS — N186 End stage renal disease: Secondary | ICD-10-CM | POA: Diagnosis not present

## 2022-10-11 DIAGNOSIS — N2581 Secondary hyperparathyroidism of renal origin: Secondary | ICD-10-CM | POA: Diagnosis not present

## 2022-10-11 DIAGNOSIS — Z992 Dependence on renal dialysis: Secondary | ICD-10-CM | POA: Diagnosis not present

## 2022-10-11 DIAGNOSIS — T8249XD Other complication of vascular dialysis catheter, subsequent encounter: Secondary | ICD-10-CM | POA: Diagnosis not present

## 2022-10-11 DIAGNOSIS — D689 Coagulation defect, unspecified: Secondary | ICD-10-CM | POA: Diagnosis not present

## 2022-10-14 ENCOUNTER — Ambulatory Visit: Payer: Self-pay

## 2022-10-14 DIAGNOSIS — T8249XD Other complication of vascular dialysis catheter, subsequent encounter: Secondary | ICD-10-CM | POA: Diagnosis not present

## 2022-10-14 DIAGNOSIS — T82594A Other mechanical complication of infusion catheter, initial encounter: Secondary | ICD-10-CM | POA: Diagnosis not present

## 2022-10-14 DIAGNOSIS — E875 Hyperkalemia: Secondary | ICD-10-CM | POA: Diagnosis not present

## 2022-10-14 DIAGNOSIS — N2581 Secondary hyperparathyroidism of renal origin: Secondary | ICD-10-CM | POA: Diagnosis not present

## 2022-10-14 DIAGNOSIS — D689 Coagulation defect, unspecified: Secondary | ICD-10-CM | POA: Diagnosis not present

## 2022-10-14 DIAGNOSIS — Z992 Dependence on renal dialysis: Secondary | ICD-10-CM | POA: Diagnosis not present

## 2022-10-14 DIAGNOSIS — N186 End stage renal disease: Secondary | ICD-10-CM | POA: Diagnosis not present

## 2022-10-14 DIAGNOSIS — Z23 Encounter for immunization: Secondary | ICD-10-CM | POA: Diagnosis not present

## 2022-10-14 NOTE — Patient Instructions (Signed)
Visit Information  Thank you for taking time to visit with me today. Please don't hesitate to contact me if I can be of assistance to you.   Following are the goals we discussed today:   Goals Addressed               This Visit's Progress     Patient Stated     I need to schedule my colonoscopy (pt-stated)        Care Coordination Interventions: Evaluation of current treatment plan related to routine colonoscopy and patient's adherence to plan as established by provider Determined patient completed colon screening with Dr. Collene Mares Determined patient will be admitted into the hospital when ready to schedule to have her colonoscopy completed per Dr. Collene Mares due to having prior colon cancer Determined patient was scheduled for her colonoscopy on 10/29/22, however, patient reports she cancelled the procedure and will reschedule early next year Educated patient regarding the guidelines for recommendations for follow up colonoscopy due to history of Colon Cancer  Encouraged patient to reschedule this procedure as soon as possible  Discussed patient will discuss getting clearance for this procedure by Dr. Nadyne Coombes, Cardiologist        Our next appointment is by telephone on 12/05/22 at 11:00 AM  Please call the care guide team at [phone] if you need to cancel or reschedule your appointment.   If you are experiencing a Mental Health or [place] or need someone to talk to, please call 1-800-273-TALK (toll free, 24 hour hotline) go to [facility] [address], [place] [phone])  Patient verbalizes understanding of instructions and care plan provided today and agrees to view in [place]. Active MyChart status and patient understanding of how to access instructions and care plan via MyChart confirmed with patient.     [name], RN, BSN, CCM Care Management Coordinator [facility] Direct Phone:  [phone]

## 2022-10-14 NOTE — Patient Outreach (Signed)
  Care Coordination   Follow Up Visit Note   10/14/2022 Name: Whitney Walker MRN: [id number] DOB: 1956/05/16  Whitney Walker is a 66 y.o. year old female who sees [name], FNP for primary care. I spoke with  Whitney Walker by phone today.  What matters to the patients health and wellness today?  Patient cancelled her Colonoscopy but plans to reschedule in the near future.     Goals Addressed               This Visit's Progress     Patient Stated     I need to schedule my colonoscopy (pt-stated)        Care Coordination Interventions: Evaluation of current treatment plan related to routine colonoscopy and patient's adherence to plan as established by provider Determined patient completed colon screening with Dr. Collene Mares Determined patient will be admitted into the hospital when ready to schedule to have her colonoscopy completed per Dr. Collene Mares due to having prior colon cancer Determined patient was scheduled for her colonoscopy on 10/29/22, however, patient reports she cancelled the procedure and will reschedule early next year Educated patient regarding the guidelines for recommendations for follow up colonoscopy due to history of Colon Cancer  Encouraged patient to reschedule this procedure as soon as possible  Discussed patient will discuss getting clearance for this procedure by Dr. Nadyne Coombes, Cardiologist          SDOH assessments and interventions completed:  No     Care Coordination Interventions Activated:  Yes  Care Coordination Interventions:  Yes, provided   Follow up plan: Follow up call scheduled for 12/05/22 '@1100'$  AM    Encounter Outcome:  Pt. Visit Completed

## 2022-10-16 DIAGNOSIS — E875 Hyperkalemia: Secondary | ICD-10-CM | POA: Diagnosis not present

## 2022-10-16 DIAGNOSIS — Z992 Dependence on renal dialysis: Secondary | ICD-10-CM | POA: Diagnosis not present

## 2022-10-16 DIAGNOSIS — Z23 Encounter for immunization: Secondary | ICD-10-CM | POA: Diagnosis not present

## 2022-10-16 DIAGNOSIS — N2581 Secondary hyperparathyroidism of renal origin: Secondary | ICD-10-CM | POA: Diagnosis not present

## 2022-10-16 DIAGNOSIS — N186 End stage renal disease: Secondary | ICD-10-CM | POA: Diagnosis not present

## 2022-10-16 DIAGNOSIS — T8249XD Other complication of vascular dialysis catheter, subsequent encounter: Secondary | ICD-10-CM | POA: Diagnosis not present

## 2022-10-16 DIAGNOSIS — T82594A Other mechanical complication of infusion catheter, initial encounter: Secondary | ICD-10-CM | POA: Diagnosis not present

## 2022-10-16 DIAGNOSIS — D689 Coagulation defect, unspecified: Secondary | ICD-10-CM | POA: Diagnosis not present

## 2022-10-17 ENCOUNTER — Ambulatory Visit: Payer: Medicare Other | Admitting: Cardiology

## 2022-10-17 ENCOUNTER — Encounter: Payer: Self-pay | Admitting: Cardiology

## 2022-10-17 ENCOUNTER — Other Ambulatory Visit: Payer: Self-pay | Admitting: Nurse Practitioner

## 2022-10-17 VITALS — BP 138/87 | HR 69 | Temp 97.9°F | Resp 16 | Ht 62.0 in | Wt 206.8 lb

## 2022-10-17 DIAGNOSIS — I6523 Occlusion and stenosis of bilateral carotid arteries: Secondary | ICD-10-CM | POA: Diagnosis not present

## 2022-10-17 DIAGNOSIS — E7800 Pure hypercholesterolemia, unspecified: Secondary | ICD-10-CM | POA: Diagnosis not present

## 2022-10-17 DIAGNOSIS — I10 Essential (primary) hypertension: Secondary | ICD-10-CM

## 2022-10-17 DIAGNOSIS — Z1231 Encounter for screening mammogram for malignant neoplasm of breast: Secondary | ICD-10-CM

## 2022-10-17 DIAGNOSIS — I951 Orthostatic hypotension: Secondary | ICD-10-CM | POA: Diagnosis not present

## 2022-10-17 MED ORDER — EZETIMIBE 10 MG PO TABS: 10.0000 mg | ORAL_TABLET | Freq: Every day | ORAL | 3 refills | Status: DC

## 2022-10-17 NOTE — Progress Notes (Signed)
Primary Physician/Referring:  [name], FNP  Patient ID: Whitney Walker, female    DOB: [birth year], 66 y.o.   MRN: [id number]  Chief Complaint  Patient presents with   Follow-up   Hypertension   Orthostatic hypotension   HPI:    Whitney Walker  is a 66 y.o. African-American female with history of obesity, hypertension, hyperlipidemia, tobacco use, stage IV kidney disease on dialysis, obstructive sleep apnea not on CPAP, COPD and severe peripheral arterial disease with history of left femoral-popliteal bypass surgery by Dr. Victorino Dike in 2014.  Patient is on chronic anticoagulation with Eliquis due to recurrent DVT and PE in 2014 and 2016.  Patient is followed by hematology for hypercoagulable state.  She is currently undergoing hemodialysis on a Monday/Wednesday/Friday schedule.  Patient was referred back to me for evaluation of orthostatic hypotension.  Previously was on midodrine, the medication has been restarted as her blood pressure drops very low during the days of dialysis.  Otherwise patient is doing well, denies any chest pain, denies symptoms of claudication.  Past Medical History:  Diagnosis Date   Anemia    Anxiety    CHF (congestive heart failure) ([place])    Colon cancer ([place])    treatment surgery   Complication of anesthesia    after first C- [place] "couldnt walk after", patient denies having a spinal   COPD (chronic obstructive pulmonary disease) ([place])    Coronary artery disease    Depression    DVT (deep venous thrombosis) ([place])    ESRD on hemodialysis ([place])    Hemo: MWF   History of blood transfusion 04/2018   Hypertension    07/07/18- no longer takes BP medications   Meningitis    Pain in limb 07/30/2013   PE (pulmonary embolism)    Peripheral vascular disease ([facility])    Restless legs    Shortness of breath    with exertion   Sleep apnea    SOB (shortness of breath) 03/03/2019   Vertigo      Social History   Tobacco Use   Smoking status:  Former    Packs/day: 0.50    Years: 50.00    Total pack years: 25.00    Types: Cigarettes    Quit date: 10/13/2022    Years since quitting: 0.0   Smokeless tobacco: Former   Tobacco comments:    She is using a vape when she runs out of her cigarettes, 7/27 - down to 1-2 cigs a day  Substance Use Topics   Alcohol use: No    Alcohol/week: 0.0 standard drinks of alcohol    Comment: she used to drink alcohol, quit in 2010; h/o heavy use    Marital Status: Single   ROS  Review of Systems  Cardiovascular:  Negative for chest pain, dyspnea on exertion and leg swelling.  Neurological:  Positive for dizziness.    Objective  Blood pressure 138/87, pulse 69, temperature 97.9 F (36.6 C), temperature source Temporal, resp. rate 16, height '5\' 2"'$  (1.575 m), weight 206 lb 12.8 oz (93.8 kg), SpO2 95 %.     10/17/2022    1:26 PM 10/08/2022   10:35 AM 07/25/2022    2:16 PM  Vitals with BMI  Height '5\' 2"'$  '5\' 2"'$  '5\' 2"'$   Weight 206 lbs 13 oz 202 lbs 196 lbs  BMI 37.81 76.54 65.03  Systolic 546 568 127  Diastolic 87 70 64  Pulse 69 101 100    Orthostatic VS for the  past 72 hrs (Last 3 readings):  Orthostatic BP Patient Position BP Location Cuff Size Orthostatic Pulse  10/17/22 1416 111/68 Standing Right Arm Large 115  10/17/22 1415 114/68 Sitting Right Arm Large 107  10/17/22 1414 117/68 Supine Right Arm Large 106    Physical Exam Neck:     Vascular: Carotid bruit (left) present. No JVD.  Cardiovascular:     Rate and Rhythm: Normal rate and regular rhythm.     Pulses:          Dorsalis pedis pulses are 0 on the right side and 0 on the left side.       Posterior tibial pulses are 0 on the right side and 0 on the left side.     Heart sounds: Normal heart sounds. No murmur heard.    No gallop.  Pulmonary:     Effort: Pulmonary effort is normal.     Breath sounds: Normal breath sounds.  Abdominal:     General: Bowel sounds are normal.     Palpations: Abdomen is soft.   Musculoskeletal:     Right lower leg: No edema.     Left lower leg: No edema.    Laboratory examination:   Recent Labs    05/18/22 0433 05/19/22 2007 05/20/22 0131 05/30/22 1604  NA 135 131* 134* 139  K 5.0 4.2 4.6 4.6  CL 102 100 103 101  CO2 20* 18* 19* 21  GLUCOSE 100* 145* 122* 118*  BUN 24* 36* 36* 30*  CREATININE 5.49* 9.05* 9.12* 7.91*  CALCIUM 8.4* 8.5* 8.7* 9.3  GFRNONAA 8* 4* 4*  --    Lab Results  Component Value Date   ALT 14 05/17/2022   AST 25 05/17/2022   ALKPHOS 89 05/17/2022   BILITOT 0.8 05/17/2022       Latest Ref Rng & Units 05/30/2022    4:04 PM 05/22/2022    2:04 AM 05/21/2022    1:00 AM  CBC  WBC 3.4 - 10.8 x10E3/uL 12.3  20.6  13.0   Hemoglobin 11.1 - 15.9 g/dL 10.3  9.6  9.4   Hematocrit 34.0 - 46.6 % 31.8  28.7  30.7   Platelets 150 - 450 x10E3/uL 285  299  286    Lab Results  Component Value Date   CHOL 183 03/15/2021   HDL 55 03/15/2021   LDLCALC 100 (H) 03/15/2021   TRIG 159 (H) 03/15/2021   CHOLHDL 3.3 03/15/2021    HEMOGLOBIN A1C Lab Results  Component Value Date   HGBA1C 5.9 (H) 11/15/2021   MPG 131.24 07/16/2021   TSH Lab Results  Component Value Date   TSH 1.57 06/04/2018     Allergies   Allergies  Allergen Reactions   Carnosine     Other reaction(s): Unknown   Gadolinium Derivatives Hives and Other (See Comments)    HIVES, Desc: HIVES W/ "DYE" USED FOR 1ST CT SCAN BUT NOT 2ND, NO PREMEDS USED, PT UNCERTAIN OF CIRCUMSTANCES,,?POSSIBLE MRI CONTRAST ALLERGY, ALL STUDIES DONE "SOMEWHERE" IN [place]//A.C., Onset Date: [id number]   Iohexol Hives and Other (See Comments)    Desc: HIVES W/ "DYE" USED FOR 1ST CT SCAN BUT NOT 2ND, NO PREMEDS USED, PT UNCERTAIN OF CIRCUMSTANCES,,?POSSIBLE MRI CONTRAST ALLERGY, ALL STUDIES DONE "SOMEWHERE" IN [place]//A.C., Onset Date: [id number]    Iodinated Contrast Media Hives   Iodine Hives   Naltrexone Other (See Comments)    Unknown    Final Medications at End of Visit     Current Outpatient Medications:  acetaminophen (TYLENOL) 325 MG tablet, Take by mouth., Disp: , Rfl:    albuterol (VENTOLIN HFA) 108 (90 Base) MCG/ACT inhaler, Inhale 2 puffs into the lungs every 6 (six) hours as needed for wheezing or shortness of breath., Disp: 18 g, Rfl: 2   apixaban (ELIQUIS) 5 MG TABS tablet, Take 1 tablet (5 mg total) by mouth 2 (two) times daily., Disp: 60 tablet, Rfl: 2   Ascorbic Acid (VITAMIN C) 100 MG tablet, Take 100 mg by mouth daily., Disp: , Rfl:    atorvastatin (LIPITOR) 20 MG tablet, Take 0.5 tablets (10 mg total) by mouth daily., Disp: 90 tablet, Rfl: 1   AURYXIA 1 GM 210 MG(Fe) tablet, Take 420-840 mg by mouth See admin instructions. Take '840mg'$  by mouth three times daily with meals and '420mg'$  with snacks., Disp: , Rfl:    BIOTIN SL, Place 10,000 mLs under the tongue., Disp: , Rfl:    diclofenac Sodium (VOLTAREN) 1 % GEL, Apply topically., Disp: , Rfl:    Doxercalciferol (HECTOROL IV), , Disp: , Rfl:    ezetimibe (ZETIA) 10 MG tablet, Take 1 tablet (10 mg total) by mouth daily., Disp: 90 tablet, Rfl: 3   fluticasone (FLONASE) 50 MCG/ACT nasal spray, Place 2 sprays into both nostrils daily., Disp: 18.2 mL, Rfl: 2   meclizine (ANTIVERT) 12.5 MG tablet, Take 1 tablet (12.5 mg total) by mouth 3 (three) times daily as needed for dizziness., Disp: 30 tablet, Rfl: 0   metoprolol succinate (TOPROL-XL) 25 MG 24 hr tablet, Take 1 tablet (25 mg total) by mouth daily. (Patient taking differently: Take 25 mg by mouth daily. On the days she does not go to dialysis), Disp: 90 tablet, Rfl: 1   midodrine (PROAMATINE) 5 MG tablet, Take 5 mg by mouth daily as needed (If SBP < 100). Take 2 tablets if BP remains low (< 110 mm Hg) during dialysis, Disp: , Rfl:    multivitamin (RENA-VIT) TABS tablet, Take 1 tablet by mouth daily., Disp: , Rfl:    omeprazole (PRILOSEC) 20 MG capsule, Take 1 capsule (20 mg total) by mouth daily., Disp: 90 capsule, Rfl: 1   vitamin E 200 UNIT capsule,  Take 200 Units by mouth daily., Disp: , Rfl:    Radiology:   No results found.  Cardiac Studies:  Left Heart Catheterization 05/02/22:  RCA: Dominant.  Mild diffuse disease. Left main: Large vessel, very short and immediately bifurcates.  Calcified. LCx: Large vessel, [place] to a very high large OM1, a moderate-sized OM 2 distally, mild to moderate diffuse disease is present.  No high-grade stenosis. LAD: Large vessel, gives coronary to a very large D1, mild diffuse disease again evident without any focal high-grade stenosis.  Diffuse coronary calcification is again evident.  LAD is tortuous in the mid to distal segment suggestive of hypertensive heart disease.  Recommendation: [name] positive cardiac markers probably related to very small vessel disease.  Do not see any obvious dissection in the major coronary vessels or thrombus formation or occlusion.  No significant change in coronary anatomy since prior angiography a year ago in 2022.  Recommend continuing medical therapy.  30 mL contrast utilized.  Echocardiogram 07/17/2021:  1. Left ventricular ejection fraction, by estimation, is 70 to 75%. The  left ventricle has hyperdynamic function. The left ventricle has no  regional wall motion abnormalities. Left ventricular diastolic parameters  are consistent with Grade I diastolic  dysfunction (impaired relaxation). Elevated left atrial pressure.   2. Right ventricular systolic function is normal.  The right ventricular  size is normal. The estimated right ventricular systolic pressure is 19.4  mmHg.   3. Left atrial size was mildly dilated.   4. The mitral valve is normal in structure. No evidence of mitral valve  regurgitation. Moderate mitral annular calcification.   5. The aortic valve is tricuspid. There is mild calcification of the  aortic valve. There is mild thickening of the aortic valve. Aortic valve  regurgitation is not visualized. Mild to moderate aortic valve   sclerosis/calcification is present, without any  evidence of aortic stenosis. Aortic valve mean gradient measures 14.0  mmHg. Aortic valve Vmax measures 2.52 m/s.   6. The inferior vena cava is normal in size with greater than 50%  respiratory variability, suggesting right atrial pressure of 3 mmHg.   PCV MYOCARDIAL PERFUSION WO LEXISCAN 11/20/2020 Lexiscan not administered at patient's request. Incomplete study. EKG tracing not available for review. Rest images show mild decrease in tracer update in basal inferior myocardium. Consider alternate ischemia studies, if clinically indicated.  Carotid artery duplex 07/09/2022: Duplex suggests stenosis in the right internal carotid artery (1-15%). Duplex suggests stenosis in the left internal carotid artery (16-49%). Antegrade right vertebral artery flow. Antegrade left vertebral artery flow. Compared to the study done on 05/08/2021, imaging was difficult and right ICA velocities could not be established and felt to be totally occluded.  However right ICA is widely patent.  Minimal stenosis with mild heterogenous plaque.  No change in the left carotid artery stenosis. Follow up in one year is appropriate if clinically indicated.  EKG:   EKG 10/17/2022: Sinus tachycardia at rate of '1 1 2 '$ bpm, low-voltage complexes otherwise normal.  Assessment     ICD-10-CM   1. Orthostatic hypotension  I95.1 EKG 12-Lead    2. Supine hypertension  I10     3. Carotid atherosclerosis, bilateral  I65.23     4. Hypercholesterolemia  E78.00 ezetimibe (ZETIA) 10 MG tablet    Lipid Panel With LDL/HDL Ratio     Medications Discontinued During This Encounter  Medication Reason   melatonin 3 MG TABS tablet     Meds ordered this encounter  Medications   ezetimibe (ZETIA) 10 MG tablet    Sig: Take 1 tablet (10 mg total) by mouth daily.    Dispense:  90 tablet    Refill:  3   Recommendations:   Whitney Walker is a 66 y.o. African-American female  with history of obesity, hypertension, hyperlipidemia, tobacco use, stage IV kidney disease on dialysis, obstructive sleep apnea not on CPAP, COPD and severe peripheral arterial disease with history of left femoral-popliteal bypass surgery by Dr. Victorino Dike in 2014.  Patient is on chronic anticoagulation with Eliquis due to recurrent DVT and PE in 2014 and 2016.  Patient is followed by hematology for hypercoagulable state.  She is currently undergoing hemodialysis on a Monday/Wednesday/Friday schedule.  Patient was referred back to me for evaluation of orthostatic hypotension.  Previously was on midodrine, the medication has been restarted as her blood pressure drops very low during the days of dialysis.  Otherwise she is presently doing well, denies any chest pain, dyspnea has remained stable, unfortunately still continues to smoke.  Her cardiovascular risk factors are not well controlled.  She has ongoing tobacco use, morbid obesity, uncontrolled LDL, underlying severe peripheral arterial disease.  I will add Zetia 10 mg daily to her present medical regimen.  With regard to hypertension, blood pressure is well controlled on a small dose  of metoprolol succinate.  Underlying sinus tachycardia is related to COPD, morbid obesity, obesity hypoventilation.  She is presently on midodrine 5 mg on a as needed basis, I have recommended that she take 5 mg of midodrine on the days of dialysis and can also use 2 tablets if blood pressure is less than 110 mmHg upon presentation to the dialysis unit and also wear support stockings on the day of dialysis only.  From vascular standpoint she is presently doing well.  It is unfortunate that she is still smoking in spite of severe PAD.  I will repeat lipid profile testing in 3 months and I will like to see her back then.  With regard to carotid stenosis, we will recheck carotid seen a year from now.     [name], PA-C 10/17/2022, 2:27 PM Office: [phone]

## 2022-10-17 NOTE — Patient Instructions (Signed)
Take midodrine 5 mg in the morning on the days of dialysis, upon reaching dialysis unit, if you are blood pressure is <120 mmHg, take additional midodrine dose.  Also use support stockings on the days of dialysis.

## 2022-10-18 DIAGNOSIS — N186 End stage renal disease: Secondary | ICD-10-CM | POA: Diagnosis not present

## 2022-10-18 DIAGNOSIS — D689 Coagulation defect, unspecified: Secondary | ICD-10-CM | POA: Diagnosis not present

## 2022-10-18 DIAGNOSIS — Z23 Encounter for immunization: Secondary | ICD-10-CM | POA: Diagnosis not present

## 2022-10-18 DIAGNOSIS — N2581 Secondary hyperparathyroidism of renal origin: Secondary | ICD-10-CM | POA: Diagnosis not present

## 2022-10-18 DIAGNOSIS — T8249XD Other complication of vascular dialysis catheter, subsequent encounter: Secondary | ICD-10-CM | POA: Diagnosis not present

## 2022-10-18 DIAGNOSIS — Z992 Dependence on renal dialysis: Secondary | ICD-10-CM | POA: Diagnosis not present

## 2022-10-18 DIAGNOSIS — T82594A Other mechanical complication of infusion catheter, initial encounter: Secondary | ICD-10-CM | POA: Diagnosis not present

## 2022-10-18 DIAGNOSIS — E875 Hyperkalemia: Secondary | ICD-10-CM | POA: Diagnosis not present

## 2022-10-21 ENCOUNTER — Other Ambulatory Visit: Payer: Self-pay | Admitting: Gastroenterology

## 2022-10-21 DIAGNOSIS — N186 End stage renal disease: Secondary | ICD-10-CM | POA: Diagnosis not present

## 2022-10-21 DIAGNOSIS — Z23 Encounter for immunization: Secondary | ICD-10-CM | POA: Diagnosis not present

## 2022-10-21 DIAGNOSIS — E875 Hyperkalemia: Secondary | ICD-10-CM | POA: Diagnosis not present

## 2022-10-21 DIAGNOSIS — Z992 Dependence on renal dialysis: Secondary | ICD-10-CM | POA: Diagnosis not present

## 2022-10-21 DIAGNOSIS — D689 Coagulation defect, unspecified: Secondary | ICD-10-CM | POA: Diagnosis not present

## 2022-10-21 DIAGNOSIS — T82594A Other mechanical complication of infusion catheter, initial encounter: Secondary | ICD-10-CM | POA: Diagnosis not present

## 2022-10-21 DIAGNOSIS — N2581 Secondary hyperparathyroidism of renal origin: Secondary | ICD-10-CM | POA: Diagnosis not present

## 2022-10-21 DIAGNOSIS — T8249XD Other complication of vascular dialysis catheter, subsequent encounter: Secondary | ICD-10-CM | POA: Diagnosis not present

## 2022-10-23 DIAGNOSIS — N186 End stage renal disease: Secondary | ICD-10-CM | POA: Diagnosis not present

## 2022-10-23 DIAGNOSIS — T8249XD Other complication of vascular dialysis catheter, subsequent encounter: Secondary | ICD-10-CM | POA: Diagnosis not present

## 2022-10-23 DIAGNOSIS — E875 Hyperkalemia: Secondary | ICD-10-CM | POA: Diagnosis not present

## 2022-10-23 DIAGNOSIS — Z992 Dependence on renal dialysis: Secondary | ICD-10-CM | POA: Diagnosis not present

## 2022-10-23 DIAGNOSIS — D689 Coagulation defect, unspecified: Secondary | ICD-10-CM | POA: Diagnosis not present

## 2022-10-23 DIAGNOSIS — N2581 Secondary hyperparathyroidism of renal origin: Secondary | ICD-10-CM | POA: Diagnosis not present

## 2022-10-23 DIAGNOSIS — T82594A Other mechanical complication of infusion catheter, initial encounter: Secondary | ICD-10-CM | POA: Diagnosis not present

## 2022-10-23 DIAGNOSIS — Z23 Encounter for immunization: Secondary | ICD-10-CM | POA: Diagnosis not present

## 2022-10-25 DIAGNOSIS — D689 Coagulation defect, unspecified: Secondary | ICD-10-CM | POA: Diagnosis not present

## 2022-10-25 DIAGNOSIS — T82594A Other mechanical complication of infusion catheter, initial encounter: Secondary | ICD-10-CM | POA: Diagnosis not present

## 2022-10-25 DIAGNOSIS — T8249XD Other complication of vascular dialysis catheter, subsequent encounter: Secondary | ICD-10-CM | POA: Diagnosis not present

## 2022-10-25 DIAGNOSIS — Z992 Dependence on renal dialysis: Secondary | ICD-10-CM | POA: Diagnosis not present

## 2022-10-25 DIAGNOSIS — N186 End stage renal disease: Secondary | ICD-10-CM | POA: Diagnosis not present

## 2022-10-25 DIAGNOSIS — N2581 Secondary hyperparathyroidism of renal origin: Secondary | ICD-10-CM | POA: Diagnosis not present

## 2022-10-25 DIAGNOSIS — Z23 Encounter for immunization: Secondary | ICD-10-CM | POA: Diagnosis not present

## 2022-10-25 DIAGNOSIS — E875 Hyperkalemia: Secondary | ICD-10-CM | POA: Diagnosis not present

## 2022-10-28 ENCOUNTER — Encounter: Payer: Self-pay | Admitting: Cardiology

## 2022-10-28 DIAGNOSIS — D689 Coagulation defect, unspecified: Secondary | ICD-10-CM | POA: Diagnosis not present

## 2022-10-28 DIAGNOSIS — T8249XD Other complication of vascular dialysis catheter, subsequent encounter: Secondary | ICD-10-CM | POA: Diagnosis not present

## 2022-10-28 DIAGNOSIS — T82594A Other mechanical complication of infusion catheter, initial encounter: Secondary | ICD-10-CM | POA: Diagnosis not present

## 2022-10-28 DIAGNOSIS — E875 Hyperkalemia: Secondary | ICD-10-CM | POA: Diagnosis not present

## 2022-10-28 DIAGNOSIS — N186 End stage renal disease: Secondary | ICD-10-CM | POA: Diagnosis not present

## 2022-10-28 DIAGNOSIS — N2581 Secondary hyperparathyroidism of renal origin: Secondary | ICD-10-CM | POA: Diagnosis not present

## 2022-10-28 DIAGNOSIS — Z23 Encounter for immunization: Secondary | ICD-10-CM | POA: Diagnosis not present

## 2022-10-28 DIAGNOSIS — Z992 Dependence on renal dialysis: Secondary | ICD-10-CM | POA: Diagnosis not present

## 2022-10-29 ENCOUNTER — Ambulatory Visit (HOSPITAL_COMMUNITY): Admit: 2022-10-29 | Payer: Medicare Other | Admitting: Gastroenterology

## 2022-10-29 ENCOUNTER — Encounter (HOSPITAL_COMMUNITY): Payer: Self-pay

## 2022-10-29 DIAGNOSIS — I129 Hypertensive chronic kidney disease with stage 1 through stage 4 chronic kidney disease, or unspecified chronic kidney disease: Secondary | ICD-10-CM | POA: Diagnosis not present

## 2022-10-29 DIAGNOSIS — N186 End stage renal disease: Secondary | ICD-10-CM | POA: Diagnosis not present

## 2022-10-29 DIAGNOSIS — Z992 Dependence on renal dialysis: Secondary | ICD-10-CM | POA: Diagnosis not present

## 2022-10-29 SURGERY — COLONOSCOPY WITH PROPOFOL
Anesthesia: Monitor Anesthesia Care

## 2022-10-30 DIAGNOSIS — D689 Coagulation defect, unspecified: Secondary | ICD-10-CM | POA: Diagnosis not present

## 2022-10-30 DIAGNOSIS — T82594A Other mechanical complication of infusion catheter, initial encounter: Secondary | ICD-10-CM | POA: Diagnosis not present

## 2022-10-30 DIAGNOSIS — E875 Hyperkalemia: Secondary | ICD-10-CM | POA: Diagnosis not present

## 2022-10-30 DIAGNOSIS — T8249XD Other complication of vascular dialysis catheter, subsequent encounter: Secondary | ICD-10-CM | POA: Diagnosis not present

## 2022-10-30 DIAGNOSIS — N186 End stage renal disease: Secondary | ICD-10-CM | POA: Diagnosis not present

## 2022-10-30 DIAGNOSIS — Z992 Dependence on renal dialysis: Secondary | ICD-10-CM | POA: Diagnosis not present

## 2022-10-30 DIAGNOSIS — N2581 Secondary hyperparathyroidism of renal origin: Secondary | ICD-10-CM | POA: Diagnosis not present

## 2022-11-01 DIAGNOSIS — T82594A Other mechanical complication of infusion catheter, initial encounter: Secondary | ICD-10-CM | POA: Diagnosis not present

## 2022-11-01 DIAGNOSIS — D689 Coagulation defect, unspecified: Secondary | ICD-10-CM | POA: Diagnosis not present

## 2022-11-01 DIAGNOSIS — N2581 Secondary hyperparathyroidism of renal origin: Secondary | ICD-10-CM | POA: Diagnosis not present

## 2022-11-01 DIAGNOSIS — E875 Hyperkalemia: Secondary | ICD-10-CM | POA: Diagnosis not present

## 2022-11-01 DIAGNOSIS — N186 End stage renal disease: Secondary | ICD-10-CM | POA: Diagnosis not present

## 2022-11-01 DIAGNOSIS — T8249XD Other complication of vascular dialysis catheter, subsequent encounter: Secondary | ICD-10-CM | POA: Diagnosis not present

## 2022-11-01 DIAGNOSIS — Z992 Dependence on renal dialysis: Secondary | ICD-10-CM | POA: Diagnosis not present

## 2022-11-04 DIAGNOSIS — N186 End stage renal disease: Secondary | ICD-10-CM | POA: Diagnosis not present

## 2022-11-04 DIAGNOSIS — N2581 Secondary hyperparathyroidism of renal origin: Secondary | ICD-10-CM | POA: Diagnosis not present

## 2022-11-04 DIAGNOSIS — D689 Coagulation defect, unspecified: Secondary | ICD-10-CM | POA: Diagnosis not present

## 2022-11-04 DIAGNOSIS — Z992 Dependence on renal dialysis: Secondary | ICD-10-CM | POA: Diagnosis not present

## 2022-11-04 DIAGNOSIS — T8249XD Other complication of vascular dialysis catheter, subsequent encounter: Secondary | ICD-10-CM | POA: Diagnosis not present

## 2022-11-04 DIAGNOSIS — E875 Hyperkalemia: Secondary | ICD-10-CM | POA: Diagnosis not present

## 2022-11-04 DIAGNOSIS — T82594A Other mechanical complication of infusion catheter, initial encounter: Secondary | ICD-10-CM | POA: Diagnosis not present

## 2022-11-06 DIAGNOSIS — N186 End stage renal disease: Secondary | ICD-10-CM | POA: Diagnosis not present

## 2022-11-06 DIAGNOSIS — Z992 Dependence on renal dialysis: Secondary | ICD-10-CM | POA: Diagnosis not present

## 2022-11-06 DIAGNOSIS — E875 Hyperkalemia: Secondary | ICD-10-CM | POA: Diagnosis not present

## 2022-11-06 DIAGNOSIS — N2581 Secondary hyperparathyroidism of renal origin: Secondary | ICD-10-CM | POA: Diagnosis not present

## 2022-11-06 DIAGNOSIS — D689 Coagulation defect, unspecified: Secondary | ICD-10-CM | POA: Diagnosis not present

## 2022-11-06 DIAGNOSIS — T82594A Other mechanical complication of infusion catheter, initial encounter: Secondary | ICD-10-CM | POA: Diagnosis not present

## 2022-11-06 DIAGNOSIS — T8249XD Other complication of vascular dialysis catheter, subsequent encounter: Secondary | ICD-10-CM | POA: Diagnosis not present

## 2022-11-08 DIAGNOSIS — D689 Coagulation defect, unspecified: Secondary | ICD-10-CM | POA: Diagnosis not present

## 2022-11-08 DIAGNOSIS — Z992 Dependence on renal dialysis: Secondary | ICD-10-CM | POA: Diagnosis not present

## 2022-11-08 DIAGNOSIS — E875 Hyperkalemia: Secondary | ICD-10-CM | POA: Diagnosis not present

## 2022-11-08 DIAGNOSIS — N186 End stage renal disease: Secondary | ICD-10-CM | POA: Diagnosis not present

## 2022-11-08 DIAGNOSIS — N2581 Secondary hyperparathyroidism of renal origin: Secondary | ICD-10-CM | POA: Diagnosis not present

## 2022-11-08 DIAGNOSIS — T82594A Other mechanical complication of infusion catheter, initial encounter: Secondary | ICD-10-CM | POA: Diagnosis not present

## 2022-11-08 DIAGNOSIS — T8249XD Other complication of vascular dialysis catheter, subsequent encounter: Secondary | ICD-10-CM | POA: Diagnosis not present

## 2022-11-11 DIAGNOSIS — E875 Hyperkalemia: Secondary | ICD-10-CM | POA: Diagnosis not present

## 2022-11-11 DIAGNOSIS — D689 Coagulation defect, unspecified: Secondary | ICD-10-CM | POA: Diagnosis not present

## 2022-11-11 DIAGNOSIS — T8249XD Other complication of vascular dialysis catheter, subsequent encounter: Secondary | ICD-10-CM | POA: Diagnosis not present

## 2022-11-11 DIAGNOSIS — N186 End stage renal disease: Secondary | ICD-10-CM | POA: Diagnosis not present

## 2022-11-11 DIAGNOSIS — N2581 Secondary hyperparathyroidism of renal origin: Secondary | ICD-10-CM | POA: Diagnosis not present

## 2022-11-11 DIAGNOSIS — T82594A Other mechanical complication of infusion catheter, initial encounter: Secondary | ICD-10-CM | POA: Diagnosis not present

## 2022-11-11 DIAGNOSIS — Z992 Dependence on renal dialysis: Secondary | ICD-10-CM | POA: Diagnosis not present

## 2022-11-12 ENCOUNTER — Telehealth: Payer: Self-pay | Admitting: *Deleted

## 2022-11-12 NOTE — Progress Notes (Signed)
  Care Coordination Note  11/12/2022 Name: Mikenna Bunkley MRN: [id number] DOB: [birth year]  Corda Shutt is a 66 y.o. year old female who is a primary care patient of [name], [place] and is actively engaged with the care management team. I reached out to Kyra Leyland by phone today to assist with re-scheduling a follow up visit with the RN Case Manager  Follow up plan: Unsuccessful telephone outreach attempt made. A HIPAA compliant phone message was left for the patient providing contact information and requesting a return call.    [place]  Direct Dial: [phone]

## 2022-11-13 DIAGNOSIS — D689 Coagulation defect, unspecified: Secondary | ICD-10-CM | POA: Diagnosis not present

## 2022-11-13 DIAGNOSIS — T82594A Other mechanical complication of infusion catheter, initial encounter: Secondary | ICD-10-CM | POA: Diagnosis not present

## 2022-11-13 DIAGNOSIS — E875 Hyperkalemia: Secondary | ICD-10-CM | POA: Diagnosis not present

## 2022-11-13 DIAGNOSIS — N186 End stage renal disease: Secondary | ICD-10-CM | POA: Diagnosis not present

## 2022-11-13 DIAGNOSIS — N2581 Secondary hyperparathyroidism of renal origin: Secondary | ICD-10-CM | POA: Diagnosis not present

## 2022-11-13 DIAGNOSIS — Z992 Dependence on renal dialysis: Secondary | ICD-10-CM | POA: Diagnosis not present

## 2022-11-13 DIAGNOSIS — T8249XD Other complication of vascular dialysis catheter, subsequent encounter: Secondary | ICD-10-CM | POA: Diagnosis not present

## 2022-11-14 ENCOUNTER — Ambulatory Visit: Payer: Medicare Other | Admitting: Student

## 2022-11-14 ENCOUNTER — Ambulatory Visit: Payer: Medicare Other | Admitting: Cardiology

## 2022-11-15 DIAGNOSIS — T8249XD Other complication of vascular dialysis catheter, subsequent encounter: Secondary | ICD-10-CM | POA: Diagnosis not present

## 2022-11-15 DIAGNOSIS — E875 Hyperkalemia: Secondary | ICD-10-CM | POA: Diagnosis not present

## 2022-11-15 DIAGNOSIS — N186 End stage renal disease: Secondary | ICD-10-CM | POA: Diagnosis not present

## 2022-11-15 DIAGNOSIS — Z992 Dependence on renal dialysis: Secondary | ICD-10-CM | POA: Diagnosis not present

## 2022-11-15 DIAGNOSIS — N2581 Secondary hyperparathyroidism of renal origin: Secondary | ICD-10-CM | POA: Diagnosis not present

## 2022-11-15 DIAGNOSIS — D689 Coagulation defect, unspecified: Secondary | ICD-10-CM | POA: Diagnosis not present

## 2022-11-15 DIAGNOSIS — T82594A Other mechanical complication of infusion catheter, initial encounter: Secondary | ICD-10-CM | POA: Diagnosis not present

## 2022-11-17 DIAGNOSIS — T82594A Other mechanical complication of infusion catheter, initial encounter: Secondary | ICD-10-CM | POA: Diagnosis not present

## 2022-11-17 DIAGNOSIS — T8249XD Other complication of vascular dialysis catheter, subsequent encounter: Secondary | ICD-10-CM | POA: Diagnosis not present

## 2022-11-17 DIAGNOSIS — Z992 Dependence on renal dialysis: Secondary | ICD-10-CM | POA: Diagnosis not present

## 2022-11-17 DIAGNOSIS — N2581 Secondary hyperparathyroidism of renal origin: Secondary | ICD-10-CM | POA: Diagnosis not present

## 2022-11-17 DIAGNOSIS — D689 Coagulation defect, unspecified: Secondary | ICD-10-CM | POA: Diagnosis not present

## 2022-11-17 DIAGNOSIS — E875 Hyperkalemia: Secondary | ICD-10-CM | POA: Diagnosis not present

## 2022-11-17 DIAGNOSIS — N186 End stage renal disease: Secondary | ICD-10-CM | POA: Diagnosis not present

## 2022-11-18 ENCOUNTER — Telehealth: Payer: Self-pay

## 2022-11-18 NOTE — Progress Notes (Signed)
Chronic Care Management Pharmacy Assistant   Name: Whitney Walker  MRN: [id number] DOB: [birth year]  Reason for Encounter: Medication Review/ Medication coordination  Recent office visits:  10-14-2022 [name], RN (CCM)  Recent consult visits:  10-17-2022 [name], MD (Cardiology). STOP melatonin. START zetia 10 mg daily.  Hospital visits:  None in previous 6 months  Medications: Outpatient Encounter Medications as of 11/18/2022  Medication Sig   acetaminophen (TYLENOL) 325 MG tablet Take by mouth.   albuterol (VENTOLIN HFA) 108 (90 Base) MCG/ACT inhaler Inhale 2 puffs into the lungs every 6 (six) hours as needed for wheezing or shortness of breath.   apixaban (ELIQUIS) 5 MG TABS tablet Take 1 tablet (5 mg total) by mouth 2 (two) times daily.   Ascorbic Acid (VITAMIN C) 100 MG tablet Take 100 mg by mouth daily.   atorvastatin (LIPITOR) 20 MG tablet Take 0.5 tablets (10 mg total) by mouth daily.   AURYXIA 1 GM 210 MG(Fe) tablet Take 420-840 mg by mouth See admin instructions. Take '840mg'$  by mouth three times daily with meals and '420mg'$  with snacks.   BIOTIN SL Place 10,000 mLs under the tongue.   diclofenac Sodium (VOLTAREN) 1 % GEL Apply topically.   Doxercalciferol (HECTOROL IV)    ezetimibe (ZETIA) 10 MG tablet Take 1 tablet (10 mg total) by mouth daily.   fluticasone (FLONASE) 50 MCG/ACT nasal spray Place 2 sprays into both nostrils daily.   meclizine (ANTIVERT) 12.5 MG tablet Take 1 tablet (12.5 mg total) by mouth 3 (three) times daily as needed for dizziness.   metoprolol succinate (TOPROL-XL) 25 MG 24 hr tablet Take 1 tablet (25 mg total) by mouth daily. (Patient taking differently: Take 25 mg by mouth daily. On the days she does not go to dialysis)   midodrine (PROAMATINE) 5 MG tablet Take 5 mg by mouth daily as needed (If SBP < 100). Take 2 tablets if BP remains low (< 110 mm Hg) during dialysis   multivitamin (RENA-VIT) TABS tablet Take 1 tablet by mouth  daily.   omeprazole (PRILOSEC) 20 MG capsule Take 1 capsule (20 mg total) by mouth daily.   vitamin E 200 UNIT capsule Take 200 Units by mouth daily.   No facility-administered encounter medications on file as of 11/18/2022.  Reviewed chart for medication changes ahead of medication coordination call.  No OVs, Consults, since last care coordination call/Pharmacist visit.   No medication changes indicated   BP Readings from Last 3 Encounters:  10/17/22 138/87  10/08/22 (!) 140/70  07/25/22 138/64    Lab Results  Component Value Date   HGBA1C 5.9 (H) 11/15/2021     Patient obtains medications through Vials  90 Days   Last adherence delivery included: None- Patient declined  Patient declined (meds) last delivery: Eliquis 5 mg twice daily- Patient stated she has plenty supply Albuterol inhaler 2 puffs every 6 hours as needed- plenty supply Omeprazole 20 mg daily- Plenty supply  Patient is due for next adherence delivery on: 11-29-2022  Called patient and reviewed medications and coordinated delivery.  This delivery to include: Eliquis 5 mg twice daily  Albuterol inhaler 2 puffs every 6 hours as needed  Metoprolol 25 mg daily Atorvastatin 10 mg daily Zetia 10 mg daily  No short/acute fills needed  Patient declined the following medications: Eliquis 5 mg twice daily- Over supply Albuterol inhaler 2 puffs every 6 hours as needed- Not needed Metoprolol 25 mg daily- Over supply Atorvastatin 10 mg daily-  Over supply Zetia 10 mg daily- Over supply  Patient needs refills for: Chasity sent to specialist Atorvastatin  Patient declined delivery for 11-29-2022. Patient stated she will call upstream when medications are needed.  Care Gaps:  Star Rating Drugs: Atorvastatin 10 mg- Last filled 08-27-2022 90 DS upstream  [place] Pharmacist Assistant [phone]

## 2022-11-19 DIAGNOSIS — D689 Coagulation defect, unspecified: Secondary | ICD-10-CM | POA: Diagnosis not present

## 2022-11-19 DIAGNOSIS — Z992 Dependence on renal dialysis: Secondary | ICD-10-CM | POA: Diagnosis not present

## 2022-11-19 DIAGNOSIS — N2581 Secondary hyperparathyroidism of renal origin: Secondary | ICD-10-CM | POA: Diagnosis not present

## 2022-11-19 DIAGNOSIS — T8249XD Other complication of vascular dialysis catheter, subsequent encounter: Secondary | ICD-10-CM | POA: Diagnosis not present

## 2022-11-19 DIAGNOSIS — T82594A Other mechanical complication of infusion catheter, initial encounter: Secondary | ICD-10-CM | POA: Diagnosis not present

## 2022-11-19 DIAGNOSIS — E875 Hyperkalemia: Secondary | ICD-10-CM | POA: Diagnosis not present

## 2022-11-19 DIAGNOSIS — N186 End stage renal disease: Secondary | ICD-10-CM | POA: Diagnosis not present

## 2022-11-22 DIAGNOSIS — N186 End stage renal disease: Secondary | ICD-10-CM | POA: Diagnosis not present

## 2022-11-22 DIAGNOSIS — T82594A Other mechanical complication of infusion catheter, initial encounter: Secondary | ICD-10-CM | POA: Diagnosis not present

## 2022-11-22 DIAGNOSIS — T8249XD Other complication of vascular dialysis catheter, subsequent encounter: Secondary | ICD-10-CM | POA: Diagnosis not present

## 2022-11-22 DIAGNOSIS — E875 Hyperkalemia: Secondary | ICD-10-CM | POA: Diagnosis not present

## 2022-11-22 DIAGNOSIS — D689 Coagulation defect, unspecified: Secondary | ICD-10-CM | POA: Diagnosis not present

## 2022-11-22 DIAGNOSIS — Z992 Dependence on renal dialysis: Secondary | ICD-10-CM | POA: Diagnosis not present

## 2022-11-22 DIAGNOSIS — N2581 Secondary hyperparathyroidism of renal origin: Secondary | ICD-10-CM | POA: Diagnosis not present

## 2022-11-25 DIAGNOSIS — E875 Hyperkalemia: Secondary | ICD-10-CM | POA: Diagnosis not present

## 2022-11-25 DIAGNOSIS — N2581 Secondary hyperparathyroidism of renal origin: Secondary | ICD-10-CM | POA: Diagnosis not present

## 2022-11-25 DIAGNOSIS — T82594A Other mechanical complication of infusion catheter, initial encounter: Secondary | ICD-10-CM | POA: Diagnosis not present

## 2022-11-25 DIAGNOSIS — N186 End stage renal disease: Secondary | ICD-10-CM | POA: Diagnosis not present

## 2022-11-25 DIAGNOSIS — D689 Coagulation defect, unspecified: Secondary | ICD-10-CM | POA: Diagnosis not present

## 2022-11-25 DIAGNOSIS — Z992 Dependence on renal dialysis: Secondary | ICD-10-CM | POA: Diagnosis not present

## 2022-11-25 DIAGNOSIS — T8249XD Other complication of vascular dialysis catheter, subsequent encounter: Secondary | ICD-10-CM | POA: Diagnosis not present

## 2022-11-26 DIAGNOSIS — Z961 Presence of intraocular lens: Secondary | ICD-10-CM | POA: Diagnosis not present

## 2022-11-26 NOTE — Progress Notes (Signed)
  Care Coordination Note  11/26/2022 Name: Antigone Crowell MRN: [id number] DOB: 11-02-1956  Madolin Twaddle is a 66 y.o. year old female who is a primary care patient of [name], [place] and is actively engaged with the care management team. I reached out to Kyra Leyland by phone today to assist with re-scheduling a follow up visit with the RN Case Manager  Follow up plan: Telephone appointment with care management team member scheduled for:12/12/22  [place]: [phone]

## 2022-11-27 DIAGNOSIS — Z992 Dependence on renal dialysis: Secondary | ICD-10-CM | POA: Diagnosis not present

## 2022-11-27 DIAGNOSIS — T82594A Other mechanical complication of infusion catheter, initial encounter: Secondary | ICD-10-CM | POA: Diagnosis not present

## 2022-11-27 DIAGNOSIS — N186 End stage renal disease: Secondary | ICD-10-CM | POA: Diagnosis not present

## 2022-11-27 DIAGNOSIS — E875 Hyperkalemia: Secondary | ICD-10-CM | POA: Diagnosis not present

## 2022-11-27 DIAGNOSIS — D689 Coagulation defect, unspecified: Secondary | ICD-10-CM | POA: Diagnosis not present

## 2022-11-27 DIAGNOSIS — T8249XD Other complication of vascular dialysis catheter, subsequent encounter: Secondary | ICD-10-CM | POA: Diagnosis not present

## 2022-11-27 DIAGNOSIS — N2581 Secondary hyperparathyroidism of renal origin: Secondary | ICD-10-CM | POA: Diagnosis not present

## 2022-11-28 DIAGNOSIS — N186 End stage renal disease: Secondary | ICD-10-CM | POA: Diagnosis not present

## 2022-11-28 DIAGNOSIS — Z992 Dependence on renal dialysis: Secondary | ICD-10-CM | POA: Diagnosis not present

## 2022-11-28 DIAGNOSIS — I129 Hypertensive chronic kidney disease with stage 1 through stage 4 chronic kidney disease, or unspecified chronic kidney disease: Secondary | ICD-10-CM | POA: Diagnosis not present

## 2022-11-29 DIAGNOSIS — N186 End stage renal disease: Secondary | ICD-10-CM | POA: Diagnosis not present

## 2022-11-29 DIAGNOSIS — D689 Coagulation defect, unspecified: Secondary | ICD-10-CM | POA: Diagnosis not present

## 2022-11-29 DIAGNOSIS — N2581 Secondary hyperparathyroidism of renal origin: Secondary | ICD-10-CM | POA: Diagnosis not present

## 2022-11-29 DIAGNOSIS — E875 Hyperkalemia: Secondary | ICD-10-CM | POA: Diagnosis not present

## 2022-11-29 DIAGNOSIS — Z992 Dependence on renal dialysis: Secondary | ICD-10-CM | POA: Diagnosis not present

## 2022-12-02 DIAGNOSIS — N2581 Secondary hyperparathyroidism of renal origin: Secondary | ICD-10-CM | POA: Diagnosis not present

## 2022-12-02 DIAGNOSIS — Z992 Dependence on renal dialysis: Secondary | ICD-10-CM | POA: Diagnosis not present

## 2022-12-02 DIAGNOSIS — N186 End stage renal disease: Secondary | ICD-10-CM | POA: Diagnosis not present

## 2022-12-02 DIAGNOSIS — D689 Coagulation defect, unspecified: Secondary | ICD-10-CM | POA: Diagnosis not present

## 2022-12-02 DIAGNOSIS — E875 Hyperkalemia: Secondary | ICD-10-CM | POA: Diagnosis not present

## 2022-12-03 ENCOUNTER — Ambulatory Visit: Payer: Medicare Other | Admitting: Nurse Practitioner

## 2022-12-03 ENCOUNTER — Encounter (HOSPITAL_COMMUNITY): Payer: Self-pay | Admitting: Gastroenterology

## 2022-12-04 DIAGNOSIS — N186 End stage renal disease: Secondary | ICD-10-CM | POA: Diagnosis not present

## 2022-12-04 DIAGNOSIS — E875 Hyperkalemia: Secondary | ICD-10-CM | POA: Diagnosis not present

## 2022-12-04 DIAGNOSIS — N2581 Secondary hyperparathyroidism of renal origin: Secondary | ICD-10-CM | POA: Diagnosis not present

## 2022-12-04 DIAGNOSIS — D689 Coagulation defect, unspecified: Secondary | ICD-10-CM | POA: Diagnosis not present

## 2022-12-04 DIAGNOSIS — Z992 Dependence on renal dialysis: Secondary | ICD-10-CM | POA: Diagnosis not present

## 2022-12-06 DIAGNOSIS — Z992 Dependence on renal dialysis: Secondary | ICD-10-CM | POA: Diagnosis not present

## 2022-12-06 DIAGNOSIS — N2581 Secondary hyperparathyroidism of renal origin: Secondary | ICD-10-CM | POA: Diagnosis not present

## 2022-12-06 DIAGNOSIS — D689 Coagulation defect, unspecified: Secondary | ICD-10-CM | POA: Diagnosis not present

## 2022-12-06 DIAGNOSIS — N186 End stage renal disease: Secondary | ICD-10-CM | POA: Diagnosis not present

## 2022-12-06 DIAGNOSIS — E875 Hyperkalemia: Secondary | ICD-10-CM | POA: Diagnosis not present

## 2022-12-09 DIAGNOSIS — Z992 Dependence on renal dialysis: Secondary | ICD-10-CM | POA: Diagnosis not present

## 2022-12-09 DIAGNOSIS — D689 Coagulation defect, unspecified: Secondary | ICD-10-CM | POA: Diagnosis not present

## 2022-12-09 DIAGNOSIS — N2581 Secondary hyperparathyroidism of renal origin: Secondary | ICD-10-CM | POA: Diagnosis not present

## 2022-12-09 DIAGNOSIS — N186 End stage renal disease: Secondary | ICD-10-CM | POA: Diagnosis not present

## 2022-12-09 DIAGNOSIS — E875 Hyperkalemia: Secondary | ICD-10-CM | POA: Diagnosis not present

## 2022-12-09 NOTE — Anesthesia Preprocedure Evaluation (Signed)
Anesthesia Evaluation  Patient identified by MRN, date of birth, ID band Patient awake    Reviewed: Allergy & Precautions, NPO status , [name] Chart, lab work & pertinent test results  Airway Mallampati: II  TM Distance: >3 FB Neck ROM: Full    Dental no notable dental hx. (+) Teeth Intact, Dental Advisory Given   Pulmonary sleep apnea , COPD, former smoker, PE   Pulmonary exam normal breath sounds clear to auscultation       Cardiovascular hypertension, + CAD, + Past MI (NSTEMI) and + DVT  Normal cardiovascular exam Rhythm:Regular Rate:Normal  04/2022 Echo  1. Left ventricular ejection fraction, by estimation, is 70 to 75%. The  left ventricle has hyperdynamic function. The left ventricle has no  regional wall motion abnormalities. There is moderate left ventricular  hypertrophy. Left ventricular diastolic  parameters are consistent with Grade I diastolic dysfunction (impaired  relaxation).   2. Right ventricular systolic function is normal. The right ventricular  size is normal.   3. No vegetation seen. The mitral valve is abnormal. No evidence of  mitral valve regurgitation. No evidence of mitral stenosis. Moderate  mitral annular calcification. No vegetation seen.   4. Increased transvalvular gradient (max 42 mmHg) likely a combination of  LVOT obstruction and possibly mild aortic valvular stenosis.      No vegetation seen.. The aortic valve is tricuspid. There is mild  calcification of the aortic valve. Aortic valve regurgitation is not  visualized. No vegetation seen   5. No signifcaitn change compared to outpatient study in 03/2022.     Neuro/Psych   Anxiety Depression       GI/Hepatic   Endo/Other  negative endocrine ROS    Renal/GU Dialysis and ESRFRenal diseaseLast HD Monday 12/11.     Musculoskeletal  (+) Arthritis ,    Abdominal   Peds  Hematology   Anesthesia Other Findings All: natrexone, iodine  contrast Media  Reproductive/Obstetrics                              Anesthesia Physical Anesthesia Plan  ASA: 4  Anesthesia Plan: MAC   Post-op Pain Management:    Induction: Intravenous  PONV Risk Score and Plan: Treatment may vary due to age or medical condition, Propofol infusion and TIVA  Airway Management Planned: Nasal Cannula and Natural Airway  Additional Equipment: None  Intra-op Plan:   Post-operative Plan:   Informed Consent: I have reviewed the patients History and Physical, chart, labs and discussed the procedure including the risks, benefits and alternatives for the proposed anesthesia with the patient or authorized representative who has indicated his/her understanding and acceptance.     Dental advisory given  Plan Discussed with:   Anesthesia Plan Comments: (Screening colonoscopy for Lung CA)         Anesthesia Quick Evaluation

## 2022-12-10 ENCOUNTER — Encounter (HOSPITAL_COMMUNITY)
Admission: RE | Disposition: A | Discharge status: Home / Self Care | Payer: Self-pay | Source: Home / Self Care | Attending: Gastroenterology

## 2022-12-10 ENCOUNTER — Encounter (HOSPITAL_COMMUNITY): Payer: Self-pay | Admitting: Gastroenterology

## 2022-12-10 ENCOUNTER — Ambulatory Visit (HOSPITAL_BASED_OUTPATIENT_CLINIC_OR_DEPARTMENT_OTHER): Payer: Medicare Other | Admitting: Anesthesiology

## 2022-12-10 ENCOUNTER — Ambulatory Visit (HOSPITAL_COMMUNITY): Payer: Medicare Other | Admitting: Anesthesiology

## 2022-12-10 ENCOUNTER — Other Ambulatory Visit: Payer: Self-pay

## 2022-12-10 ENCOUNTER — Ambulatory Visit (HOSPITAL_COMMUNITY)
Admission: RE | Admit: 2022-12-10 | Discharge: 2022-12-10 | Disposition: A | Discharge status: Home / Self Care | Payer: Medicare Other | Attending: Gastroenterology | Admitting: Gastroenterology

## 2022-12-10 DIAGNOSIS — J449 Chronic obstructive pulmonary disease, unspecified: Secondary | ICD-10-CM | POA: Insufficient documentation

## 2022-12-10 DIAGNOSIS — Z980 Intestinal bypass and anastomosis status: Secondary | ICD-10-CM | POA: Insufficient documentation

## 2022-12-10 DIAGNOSIS — Z992 Dependence on renal dialysis: Secondary | ICD-10-CM | POA: Diagnosis not present

## 2022-12-10 DIAGNOSIS — K6389 Other specified diseases of intestine: Secondary | ICD-10-CM | POA: Diagnosis not present

## 2022-12-10 DIAGNOSIS — Z9049 Acquired absence of other specified parts of digestive tract: Secondary | ICD-10-CM | POA: Diagnosis not present

## 2022-12-10 DIAGNOSIS — Z85038 Personal history of other malignant neoplasm of large intestine: Secondary | ICD-10-CM | POA: Insufficient documentation

## 2022-12-10 DIAGNOSIS — I252 Old myocardial infarction: Secondary | ICD-10-CM | POA: Diagnosis not present

## 2022-12-10 DIAGNOSIS — I132 Hypertensive heart and chronic kidney disease with heart failure and with stage 5 chronic kidney disease, or end stage renal disease: Secondary | ICD-10-CM | POA: Insufficient documentation

## 2022-12-10 DIAGNOSIS — I509 Heart failure, unspecified: Secondary | ICD-10-CM | POA: Insufficient documentation

## 2022-12-10 DIAGNOSIS — Z87891 Personal history of nicotine dependence: Secondary | ICD-10-CM

## 2022-12-10 DIAGNOSIS — G4730 Sleep apnea, unspecified: Secondary | ICD-10-CM | POA: Insufficient documentation

## 2022-12-10 DIAGNOSIS — Z86718 Personal history of other venous thrombosis and embolism: Secondary | ICD-10-CM | POA: Insufficient documentation

## 2022-12-10 DIAGNOSIS — Z1211 Encounter for screening for malignant neoplasm of colon: Secondary | ICD-10-CM

## 2022-12-10 DIAGNOSIS — N186 End stage renal disease: Secondary | ICD-10-CM | POA: Diagnosis not present

## 2022-12-10 DIAGNOSIS — I2510 Atherosclerotic heart disease of native coronary artery without angina pectoris: Secondary | ICD-10-CM | POA: Diagnosis not present

## 2022-12-10 HISTORY — PX: FLEXIBLE SIGMOIDOSCOPY: SHX5431

## 2022-12-10 LAB — POCT I-STAT, CHEM 8
BUN: 32 mg/dL — ABNORMAL HIGH (ref 8–23)
Calcium, Ion: 1.02 mmol/L — ABNORMAL LOW (ref 1.15–1.40)
Chloride: 96 mmol/L — ABNORMAL LOW (ref 98–111)
Creatinine, Ser: 9.1 mg/dL — ABNORMAL HIGH (ref 0.44–1.00)
Glucose, Bld: 95 mg/dL (ref 70–99)
HCT: 41 % (ref 36.0–46.0)
Hemoglobin: 13.9 g/dL (ref 12.0–15.0)
Potassium: 4.5 mmol/L (ref 3.5–5.1)
Sodium: 136 mmol/L (ref 135–145)
TCO2: 30 mmol/L (ref 22–32)

## 2022-12-10 SURGERY — SIGMOIDOSCOPY, FLEXIBLE
Anesthesia: Monitor Anesthesia Care

## 2022-12-10 MED ORDER — SODIUM CHLORIDE 0.9 % IV SOLN
INTRAVENOUS | Status: DC
  Administered 2022-12-10: 500 mL via INTRAVENOUS

## 2022-12-10 MED ORDER — LIDOCAINE 2% (20 MG/ML) 5 ML SYRINGE
INTRAMUSCULAR | Status: DC | PRN
  Administered 2022-12-10: 40 mg via INTRAVENOUS

## 2022-12-10 MED ORDER — PROPOFOL 500 MG/50ML IV EMUL
INTRAVENOUS | Status: DC | PRN
  Administered 2022-12-10: 100 ug/kg/min via INTRAVENOUS

## 2022-12-10 MED ORDER — PROPOFOL 10 MG/ML IV BOLUS
INTRAVENOUS | Status: DC | PRN
  Administered 2022-12-10: 30 mg via INTRAVENOUS

## 2022-12-10 SURGICAL SUPPLY — 22 items

## 2022-12-10 NOTE — Op Note (Signed)
[facility] Patient Name: Whitney Walker Procedure Date: 12/10/2022 MRN: [id number] Attending MD: [name] , MD, [id number] Date of Birth: July 18, 1956 CSN: [id number] Age: 66 Admit Type: Outpatient Procedure:                Flexible Sigmoidoscopy Indications:              Screening for malignant neoplasm in the colon Providers:                [name], MD, Adah Perl RN, RN, Cletis Athens, Technician Referring MD:              Medicines:                Propofol per Anesthesia Complications:            No immediate complications. Estimated Blood Loss:     Estimated blood loss: none. Procedure:                Pre-Anesthesia Assessment:                           - Prior to the procedure, a History and Physical                            was performed, and patient medications and                            allergies were reviewed. The patient's tolerance of                            previous anesthesia was also reviewed. The risks                            and benefits of the procedure and the sedation                            options and risks were discussed with the patient.                            All questions were answered, and informed consent                            was obtained. Prior Anticoagulants: The patient has                            taken Eliquis (apixaban), last dose was 2 days                            prior to procedure. ASA Grade Assessment: III - A                            patient with severe systemic disease. After  reviewing the risks and benefits, the patient was                            deemed in satisfactory condition to undergo the                            procedure.                           - Sedation was administered by an anesthesia                            professional. Deep sedation was attained.                           After obtaining informed  consent, the scope was                            passed under direct vision. The CF-[id number] ([id number])                            Olympus colonoscope was introduced through the anus                            and advanced to the the ileo-rectal anastomosis.                            After obtaining informed consent, the scope was                            passed under direct vision. The flexible                            sigmoidoscopy was accomplished without difficulty.                            The patient tolerated the procedure well. The                            quality of the bowel preparation was good. Scope In: Scope Out: Findings:      A diffuse area of moderate melanosis was found in the rectum.      There was evidence of a prior end-to-side ileo-rectal anastomosis in the       rectum. This was patent and was characterized by healthy appearing       mucosa. The anastomosis was traversed.      Melanosis was noted in the ileal mucosa as well as the rectal mucosa.       The ileo-rectal anastomosis was at 10 cm from the anal verge. Impression:               - Melanosis in the colon.                           - Patent end-to-side ileo-rectal anastomosis,  characterized by healthy appearing mucosa.                           - No specimens collected. Moderate Sedation:      Not Applicable - Patient had care per Anesthesia. Recommendation:           - Patient has a contact number available for                            emergencies. The signs and symptoms of potential                            delayed complications were discussed with the                            patient. Return to normal activities tomorrow.                            Written discharge instructions were provided to the                            patient.                           - Resume previous diet.                           - No repeat screening FFS with her age,                             comorbidities, and the absence of polyps. Procedure Code(s):        --- Professional ---                           [phone], Sigmoidoscopy, flexible; diagnostic,                            including collection of specimen(s) by brushing or                            washing, when performed (separate procedure) Diagnosis Code(s):        --- Professional ---                           Z12.11, Encounter for screening for malignant                            neoplasm of colon                           K63.89, Other specified diseases of intestine                           Z98.0, Intestinal bypass and anastomosis status CPT copyright 2022 American Medical Association. All rights reserved. The codes documented in this report are preliminary and upon coder review may  be revised  to meet current compliance requirements. [name], MD [name], MD 12/10/2022 12:33:49 PM This report has been signed electronically. Number of Addenda: 0

## 2022-12-10 NOTE — Anesthesia Procedure Notes (Signed)
Procedure Name: MAC Date/Time: 12/10/2022 12:18 PM  Performed by: Jenne Campus, CRNAPre-anesthesia Checklist: Patient identified, Emergency Drugs available, Suction available and Patient being monitored Oxygen Delivery Method: Simple face mask Placement Confirmation: positive ETCO2

## 2022-12-10 NOTE — H&P (Addendum)
Whitney Walker HPI: This 66 year old black female presents to the office for colorectal cancer screening. She has 5 BM's per day with no obvious blood or mucus in the stool. She has a good appetite and has lost 24 pounds over the last 5 years. She denies having any complaints of abdominal pain, nausea, vomiting, acid reflux, dysphagia or odynophagia. She denies having a family history of celiac sprue or IBD. She was diagnosed with neuroendocrine carcinoma of the cecum in 2008 while living in [place], [place] and had a partial colectomy done. She had an abdominal colectomy done on 12/08/2009 by Dr. Avel Peace for diverticular disease. Her last procedure here a flexible sigmoidoscopy was done on 06/05/2012 which revealed internal hemorrhoids and a normal anastomosis noted at 20 cm, the terminal ileum appears normal. A Virtual CT done on 07/24/2017 revealed evidence of a partial colectomy with only distal sigmoid colon and rectum remaining;no visible annular constricting lesions or polypoid filling defects were noted; hypodensities in the kidneys bilaterally shown in prior CT, represent ?cyst; also noted was aortic atherosclerosis, prior cholecystectomy and hysterectomy, moderate pericardial effusions, also seen on prior CT.   Past Medical History:  Diagnosis Date   Anemia    Anxiety    CHF (congestive heart failure) (HCC)    Colon cancer (HCC)    treatment surgery   Complication of anesthesia    after first C- [place] "couldnt walk after", patient denies having a spinal   COPD (chronic obstructive pulmonary disease) (HCC)    Coronary artery disease    Depression    DVT (deep venous thrombosis) (HCC)    ESRD on hemodialysis (HCC)    Hemo: MWF   History of blood transfusion 04/2018   Hypertension    07/07/18- no longer takes BP medications   Meningitis    Pain in limb 07/30/2013   PE (pulmonary embolism)    Peripheral vascular disease (HCC)    Restless legs    Shortness of breath     with exertion   Sleep apnea    SOB (shortness of breath) 03/03/2019   Vertigo     Past Surgical History:  Procedure Laterality Date   ABDOMINAL AORTAGRAM N/A 07/26/2013   Procedure: ABDOMINAL Ronny Flurry;  Surgeon: Fransisco Hertz, MD;  Location[facility] CATH LAB;  Service: Cardiovascular;  Laterality: N/A;   ABDOMINAL HYSTERECTOMY     AV FISTULA PLACEMENT Left 05/05/2018   Procedure: ARTERIOVENOUS (AV) FISTULA CREATION BRACHIOCEPHALIC;  Surgeon: Maeola Harman, MD;  Location: [facility] OR;  Service: Vascular;  Laterality: Left;   AV FISTULA PLACEMENT Left 07/16/2018   Procedure: ARTERIOVENOUS FISTULA CREATION;  Surgeon: Maeola Harman, MD;  Location: [facility] OR;  Service: Vascular;  Laterality: Left;   BASCILIC VEIN TRANSPOSITION Left 09/03/2018   Procedure: BASILIC VEIN TRANSPOSITION SECOND STAGE;  Surgeon: Maeola Harman, MD;  Location: [facility] OR;  Service: Vascular;  Laterality: Left;   BREAST BIOPSY Left    CESAREAN SECTION     X 3 1974-1977   CHOLECYSTECTOMY     CHOLECYSTECTOMY  1980/s   COLECTOMY  2010   DIVERTICULOSIS SURGERY-2002  2012   FEMORAL-POPLITEAL BYPASS GRAFT Left 08/13/2013   Procedure: BYPASS GRAFT FEMORAL-POPLITEAL ARTERY WITH NON-REVERSED SAPHANEOUS VEIN; ULTRASOUND GUIDED;  Surgeon: Pryor Ochoa, MD;  Location: [facility] OR;  Service: Vascular;  Laterality: Left;   INSERTION OF DIALYSIS CATHETER Right 04/30/2018   Procedure: INSERTION OF Right Internal Jugular DIALYSIS CATHETER;  Surgeon: Nada Libman, MD;  Location: [facility] OR;  Service: Vascular;  Laterality: Right;   IR FLUORO GUIDE CV LINE LEFT  05/01/2022   IR FLUORO GUIDE CV LINE LEFT  05/21/2022   IR FLUORO GUIDE CV LINE RIGHT  07/24/2020   IR FLUORO GUIDE CV LINE RIGHT  07/21/2021   IR FLUORO GUIDE CV LINE RIGHT  05/20/2022   IR PTA VENOUS EXCEPT DIALYSIS CIRCUIT  05/21/2022   IR REMOVAL TUN CV CATH W/O FL  07/21/2020   IR REMOVAL TUN CV CATH W/O FL  07/19/2021   IR REMOVAL TUN CV CATH W/O FL  05/17/2022   IR US  GUIDE VASC ACCESS LEFT  05/21/2022   IR US GUIDE VASC ACCESS RIGHT  07/24/2020   IR US GUIDE VASC ACCESS RIGHT  07/21/2021   IR US GUIDE VASC ACCESS RIGHT  05/20/2022   IR VENOCAVAGRAM SVC  05/21/2022   LEFT HEART CATH AND CORONARY ANGIOGRAPHY N/A 05/01/2021   Procedure: LEFT HEART CATH AND CORONARY ANGIOGRAPHY;  Surgeon: Yates Decamp, MD;  Location: [facility] INVASIVE CV LAB;  Service: Cardiovascular;  Laterality: N/A;   LEFT HEART CATH AND CORONARY ANGIOGRAPHY N/A 05/02/2022   Procedure: LEFT HEART CATH AND CORONARY ANGIOGRAPHY;  Surgeon: Yates Decamp, MD;  Location: [facility] INVASIVE CV LAB;  Service: Cardiovascular;  Laterality: N/A;   LOWER EXTREMITY ANGIOGRAM Left 07/26/2013   Procedure: LOWER EXTREMITY ANGIOGRAM;  Surgeon: Fransisco Hertz, MD;  Location: [facility] CATH LAB;  Service: Cardiovascular;  Laterality: Left;    Family History  Problem Relation Age of Onset   Cancer Mother 60       breast and bone   Cancer Father 24       prostate   Hypertension Sister    Bleeding Disorder Sister    Cancer Cousin 20       breast cancer    Hypertension Daughter    Breast cancer Neg Hx     Social History:  reports that she quit smoking about 8 weeks ago. Her smoking use included cigarettes. She has a 25.00 pack-year smoking history. She has quit using smokeless tobacco. She reports that she does not currently use drugs after having used the following drugs: Oxycodone. She reports that she does not drink alcohol.  Allergies:  Allergies  Allergen Reactions   Carnosine     Other reaction(s): Unknown   Gadolinium Derivatives Hives and Other (See Comments)    HIVES, Desc: HIVES W/ "DYE" USED FOR 1ST CT SCAN BUT NOT 2ND, NO PREMEDS USED, PT UNCERTAIN OF CIRCUMSTANCES,,?POSSIBLE MRI CONTRAST ALLERGY, ALL STUDIES DONE "SOMEWHERE" IN [place]//A.C., Onset Date: 16109604   Iohexol Hives and Other (See Comments)    Desc: HIVES W/ "DYE" USED FOR 1ST CT SCAN BUT NOT 2ND, NO PREMEDS USED, PT UNCERTAIN OF CIRCUMSTANCES,,?POSSIBLE  MRI CONTRAST ALLERGY, ALL STUDIES DONE "SOMEWHERE" IN [place]//A.C., Onset Date: [id number]    Iodinated Contrast Media Hives    1974   Iodine Hives    1974   Naltrexone Other (See Comments)    Unknown 1974     Medications: Scheduled: Continuous:  sodium chloride 500 mL (12/10/22 1117)    Results for orders placed or performed during the hospital encounter of 12/10/22 (from the past 24 hour(s))  I-STAT, chem 8     Status: Abnormal   Collection Time: 12/10/22 12:00 PM  Result Value Ref Range   Sodium 136 135 - 145 mmol/L   Potassium 4.5 3.5 - 5.1 mmol/L   Chloride 96 (L) 98 - 111 mmol/L   BUN 32 (H) 8 -  23 mg/dL   Creatinine, Ser 1.61 (H) 0.44 - 1.00 mg/dL   Glucose, Bld 95 70 - 99 mg/dL   Calcium, Ion 0.96 (L) 1.15 - 1.40 mmol/L   TCO2 30 22 - 32 mmol/L   Hemoglobin 13.9 12.0 - 15.0 g/dL   HCT 04.5 40.9 - 81.1 %     No results found.  ROS:  As stated above in the HPI otherwise negative.  Pulse (!) 112, temperature (!) 97.1 F (36.2 C), temperature source Temporal, resp. rate 12, height 5\' 2"  (1.575 m), weight 92.5 kg, SpO2 95 %.    PE: Gen: NAD, Alert and Oriented HEENT:  [place]/AT, EOMI Neck: Supple, no LAD Lungs: CTA Bilaterally CV: RRR without M/G/R ABD: Soft, NTND, +BS Ext: No C/C/E  Assessment/Plan: 1) Screening - Colonoscopy versus FFS.  She may only have 20 cm left in her colon.  CORRECTION:  This is a FFS for a personal history of colon cancer.  Jadore Veals D 12/10/2022, 12:05 PM

## 2022-12-10 NOTE — Discharge Instructions (Signed)
YOU HAD AN ENDOSCOPIC PROCEDURE TODAY: Refer to the procedure report and other information in the discharge instructions given to you for any specific questions about what was found during the examination. If this information does not answer your questions, please call [facility] at [phone] to clarify.   YOU SHOULD EXPECT: Some feelings of bloating in the abdomen. Passage of more gas than usual. Walking can help get rid of the air that was put into your GI tract during the procedure and reduce the bloating. If you had a lower endoscopy (such as a colonoscopy or flexible sigmoidoscopy) you may notice spotting of blood in your stool or on the toilet paper. Some abdominal soreness may be present for a day or two, also.  DIET: Your first meal following the procedure should be a light meal and then it is ok to progress to your normal diet. A half-sandwich or bowl of soup is an example of a good first meal. Heavy or fried foods are harder to digest and may make you feel nauseous or bloated. Drink plenty of fluids but you should avoid alcoholic beverages for 24 hours.  ACTIVITY: Your care partner should take you home directly after the procedure. You should plan to take it easy, moving slowly for the rest of the day. You can resume normal activity the day after the procedure however YOU SHOULD NOT DRIVE, use power tools, machinery or perform tasks that involve climbing or major physical exertion for 24 hours (because of the sedation medicines used during the test).   SYMPTOMS TO REPORT IMMEDIATELY: A gastroenterologist can be reached at any hour. Please call [phone]  for any of the following symptoms:  Following lower endoscopy (colonoscopy, flexible sigmoidoscopy) Excessive amounts of blood in the stool  Significant tenderness, worsening of abdominal pains  Swelling of the abdomen that is new, acute  Fever of 100 or higher    FOLLOW UP:  If any biopsies were taken you will be  contacted by phone or by letter within the next 1-3 weeks. Call [phone]  if you have not heard about the biopsies in 3 weeks.  Please also call with any specific questions about appointments or follow up tests.  

## 2022-12-10 NOTE — Anesthesia Postprocedure Evaluation (Signed)
Anesthesia Post Note  Patient: Whitney Walker  Procedure(s) Performed: [place]     Patient location during evaluation: Endoscopy Anesthesia Type: MAC Level of consciousness: awake and alert Pain management: pain level controlled Vital Signs Assessment: post-procedure vital signs reviewed and stable Respiratory status: spontaneous breathing, nonlabored ventilation, respiratory function stable and patient connected to nasal cannula oxygen Cardiovascular status: blood pressure returned to baseline and stable Postop Assessment: no apparent nausea or vomiting Anesthetic complications: no  No notable events documented.  Last Vitals:  Vitals:   12/10/22 1103  Pulse: (!) 112  Resp: 12  Temp: (!) 36.2 C  SpO2: 95%    Last Pain:  Vitals:   12/10/22 1103  TempSrc: Temporal  PainSc: 0-No pain                 Barnet [place]

## 2022-12-10 NOTE — Transfer of Care (Signed)
Immediate Anesthesia Transfer of Care Note  Patient: Whitney Walker  Procedure(s) Performed: FLEXIBLE SIGMOIDOSCOPY  Patient Location: Endoscopy Unit  Anesthesia Type:MAC  Level of Consciousness: oriented, drowsy, and patient cooperative  Airway & Oxygen Therapy: Patient Spontanous Breathing and Patient connected to nasal cannula oxygen  Post-op Assessment: Report given to RN and Post -op Vital signs reviewed and stable  Post vital signs: Reviewed  Last Vitals:  Vitals Value Taken Time  BP    Temp    Pulse    Resp    SpO2      Last Pain:  Vitals:   12/10/22 1103  TempSrc: Temporal  PainSc: 0-No pain         Complications: No notable events documented.

## 2022-12-11 DIAGNOSIS — D689 Coagulation defect, unspecified: Secondary | ICD-10-CM | POA: Diagnosis not present

## 2022-12-11 DIAGNOSIS — E875 Hyperkalemia: Secondary | ICD-10-CM | POA: Diagnosis not present

## 2022-12-11 DIAGNOSIS — Z992 Dependence on renal dialysis: Secondary | ICD-10-CM | POA: Diagnosis not present

## 2022-12-11 DIAGNOSIS — N2581 Secondary hyperparathyroidism of renal origin: Secondary | ICD-10-CM | POA: Diagnosis not present

## 2022-12-11 DIAGNOSIS — N186 End stage renal disease: Secondary | ICD-10-CM | POA: Diagnosis not present

## 2022-12-12 ENCOUNTER — Ambulatory Visit: Payer: Self-pay

## 2022-12-12 ENCOUNTER — Ambulatory Visit: Payer: Medicare Other

## 2022-12-12 NOTE — Patient Instructions (Signed)
Visit Information  Thank you for taking time to visit with me today. Please don't hesitate to contact me if I can be of assistance to you.   Following are the goals we discussed today:   Goals Addressed               This Visit's Progress     Patient Stated     COMPLETED: I need to schedule my colonoscopy (pt-stated)        Care Coordination Interventions: Evaluation of current treatment plan related to routine colonoscopy and patient's adherence to plan as established by provider Determined patient completed her Sigmoidectomy on 76/81/15 without complications, patient reports she received a good report with no evidence of disease       I will consider seeing a Pulmonologist for my COPD (pt-stated)        Care Coordination Interventions: Provided patient with basic written and verbal COPD education on self care/management/and exacerbation prevention Determined patient was unable to complete her PCP visit to discuss a Pulmonology referral  Determined patient is using her Albuterol inhaler more frequently, she denies having shortness of breath Sent in basket message to PCP advising patient is still asking for a Pulmonology referral, advised she is using her Albuterol inhaler more frequently  Advised PCP, patient does no wish to be seen prior to 01/02/23, which is next scheduled visit with PCP Instructed patient to report to the ED if her symptoms worsen         Our next appointment is by telephone on 01/21/23 at 0930 AM  Please call the care guide team at [phone] if you need to cancel or reschedule your appointment.   If you are experiencing a Mental Health or [place] or need someone to talk to, please call 1-800-273-TALK (toll free, 24 hour hotline)  Patient verbalizes understanding of instructions and care plan provided today and agrees to view in [place]. Active MyChart status and patient understanding of how to access instructions and care plan via MyChart  confirmed with patient.     [name], RN, BSN, CCM Care Management Coordinator [facility] Care Management Direct Phone: [phone]

## 2022-12-12 NOTE — Patient Outreach (Signed)
  Care Coordination   Follow Up Visit Note   12/12/2022 Name: Whitney Walker MRN: [id number] DOB: 1956/08/02  Whitney Walker is a 66 y.o. year old female who sees [name], FNP for primary care. I spoke with  Whitney Walker by phone today.  What matters to the patients health and wellness today?  Patient would like to get re-established with a Pulmonologist for management of COPD.     Goals Addressed               This Visit's Progress     Patient Stated     COMPLETED: I need to schedule my colonoscopy (pt-stated)        Care Coordination Interventions: Evaluation of current treatment plan related to routine colonoscopy and patient's adherence to plan as established by provider Determined patient completed her Sigmoidectomy on 37/04/88 without complications, patient reports she received a good report with no evidence of disease       I will consider seeing a Pulmonologist for my COPD (pt-stated)        Care Coordination Interventions: Provided patient with basic written and verbal COPD education on self care/management/and exacerbation prevention Determined patient was unable to complete her PCP visit to discuss a Pulmonology referral  Determined patient is using her Albuterol inhaler more frequently, she denies having shortness of breath Sent in basket message to PCP advising patient is still asking for a Pulmonology referral, advised she is using her Albuterol inhaler more frequently  Advised PCP, patient does no wish to be seen prior to 01/02/23, which is next scheduled visit with PCP Instructed patient to report to the ED if her symptoms worsen         SDOH assessments and interventions completed:  No     Care Coordination Interventions:  Yes, provided   Follow up plan: Follow up call scheduled for 01/21/23 '@0930'$  AM    Encounter Outcome:  Pt. Visit Completed

## 2022-12-13 DIAGNOSIS — N2581 Secondary hyperparathyroidism of renal origin: Secondary | ICD-10-CM | POA: Diagnosis not present

## 2022-12-13 DIAGNOSIS — E875 Hyperkalemia: Secondary | ICD-10-CM | POA: Diagnosis not present

## 2022-12-13 DIAGNOSIS — N186 End stage renal disease: Secondary | ICD-10-CM | POA: Diagnosis not present

## 2022-12-13 DIAGNOSIS — Z992 Dependence on renal dialysis: Secondary | ICD-10-CM | POA: Diagnosis not present

## 2022-12-13 DIAGNOSIS — D689 Coagulation defect, unspecified: Secondary | ICD-10-CM | POA: Diagnosis not present

## 2022-12-15 ENCOUNTER — Encounter (HOSPITAL_COMMUNITY): Payer: Self-pay | Admitting: Gastroenterology

## 2022-12-16 DIAGNOSIS — D689 Coagulation defect, unspecified: Secondary | ICD-10-CM | POA: Diagnosis not present

## 2022-12-16 DIAGNOSIS — N186 End stage renal disease: Secondary | ICD-10-CM | POA: Diagnosis not present

## 2022-12-16 DIAGNOSIS — Z992 Dependence on renal dialysis: Secondary | ICD-10-CM | POA: Diagnosis not present

## 2022-12-16 DIAGNOSIS — N2581 Secondary hyperparathyroidism of renal origin: Secondary | ICD-10-CM | POA: Diagnosis not present

## 2022-12-16 DIAGNOSIS — E875 Hyperkalemia: Secondary | ICD-10-CM | POA: Diagnosis not present

## 2022-12-17 ENCOUNTER — Telehealth: Payer: Self-pay

## 2022-12-17 NOTE — Progress Notes (Signed)
Chronic Care Management Pharmacy Assistant   Name: Whitney Walker  MRN: [id number] DOB: 1956-11-30   Reason for Encounter: Medication Review/ Medication coordination  Recent office visits:  None  Recent consult visits:  12-10-2022 Whitney Ada, MD. Flexible sigmoidoscopy procedure completed.  Hospital visits:  None in previous 6 months  Medications: Outpatient Encounter Medications as of 12/17/2022  Medication Sig Note   acetaminophen (TYLENOL) 325 MG tablet Take 650 mg by mouth daily as needed for headache.    acetaminophen (TYLENOL) 650 MG CR tablet Take 650 mg by mouth 3 (three) times daily as needed (arthritis).    albuterol (VENTOLIN HFA) 108 (90 Base) MCG/ACT inhaler Inhale 2 puffs into the lungs every 6 (six) hours as needed for wheezing or shortness of breath.    apixaban (ELIQUIS) 5 MG TABS tablet Take 1 tablet (5 mg total) by mouth 2 (two) times daily.    Ascorbic Acid (VITAMIN C) 100 MG tablet Take 100 mg by mouth daily.    atorvastatin (LIPITOR) 20 MG tablet Take 0.5 tablets (10 mg total) by mouth daily. (Patient taking differently: Take 10 mg by mouth at bedtime.)    AURYXIA 1 GM 210 MG(Fe) tablet Take 420-840 mg by mouth See admin instructions. Take '840mg'$  by mouth three times daily with meals and '420mg'$  with snacks.    BIOTIN SL Place 10,000 mLs under the tongue daily.    cromolyn (OPTICROM) 4 % ophthalmic solution Place 1 drop into both eyes 4 (four) times daily.    diclofenac Sodium (VOLTAREN) 1 % GEL Apply 2 g topically daily as needed (pain).    Doxercalciferol (HECTOROL IV)  12/06/2022: Given at dialysis   ezetimibe (ZETIA) 10 MG tablet Take 1 tablet (10 mg total) by mouth daily. (Patient not taking: Reported on 12/06/2022)    fluticasone (FLONASE) 50 MCG/ACT nasal spray Place 2 sprays into both nostrils daily. (Patient taking differently: Place 2 sprays into both nostrils daily as needed for allergies.)    meclizine (ANTIVERT) 12.5 MG tablet Take 1 tablet  (12.5 mg total) by mouth 3 (three) times daily as needed for dizziness.    metoprolol succinate (TOPROL-XL) 25 MG 24 hr tablet Take 1 tablet (25 mg total) by mouth daily. (Patient taking differently: Take 25 mg by mouth daily. On the days she does not go to dialysis Tues, thrus, Saturday and sunday)    multivitamin (RENA-VIT) TABS tablet Take 1 tablet by mouth daily.    omeprazole (PRILOSEC) 20 MG capsule Take 1 capsule (20 mg total) by mouth daily.    vitamin E 200 UNIT capsule Take 200 Units by mouth daily.    No facility-administered encounter medications on file as of 12/17/2022.  Reviewed chart for medication changes ahead of medication coordination call.  No medication changes indicated   BP Readings from Last 3 Encounters:  12/10/22 135/70  10/17/22 138/87  10/08/22 (!) 140/70    Lab Results  Component Value Date   HGBA1C 5.9 (H) 11/15/2021     Patient obtains medications through [facility]  90 Days   Last adherence delivery included: Patient declined delivery  Patient declined (meds) last month: Patient declined delivery  Patient is due for next adherence delivery on: 12-30-2021  Called patient and reviewed medications and coordinated delivery.  This delivery to include: Eliquis 5 mg twice daily  Albuterol inhaler 2 puffs every 6 hours as needed   Metoprolol 25 mg daily Atorvastatin 10 mg daily Omeprazole 20 mg daily  No acute/short fill needed  Patient declined the following medications: Zetia- Patient stated she stopped taking and informed cardiology Meclizine- Not needed  Patient needs refills for: Sent to specialist by [facility] Atorvastatin  Confirmed delivery date of 12-30-2021 advised patient that pharmacy will contact them the morning of delivery.   Care Gaps: Tdap overdue Shingrix overdue Colonoscopy overdue Covid booster overdue Dexa scan overdue  Star Rating Drugs: Atorvastatin 10 mg- Last filled 08-27-2022 [facility]  [place] Pharmacist Assistant [phone]

## 2022-12-18 DIAGNOSIS — N186 End stage renal disease: Secondary | ICD-10-CM | POA: Diagnosis not present

## 2022-12-18 DIAGNOSIS — Z992 Dependence on renal dialysis: Secondary | ICD-10-CM | POA: Diagnosis not present

## 2022-12-18 DIAGNOSIS — N2581 Secondary hyperparathyroidism of renal origin: Secondary | ICD-10-CM | POA: Diagnosis not present

## 2022-12-18 DIAGNOSIS — E875 Hyperkalemia: Secondary | ICD-10-CM | POA: Diagnosis not present

## 2022-12-18 DIAGNOSIS — D689 Coagulation defect, unspecified: Secondary | ICD-10-CM | POA: Diagnosis not present

## 2022-12-20 DIAGNOSIS — N2581 Secondary hyperparathyroidism of renal origin: Secondary | ICD-10-CM | POA: Diagnosis not present

## 2022-12-20 DIAGNOSIS — Z992 Dependence on renal dialysis: Secondary | ICD-10-CM | POA: Diagnosis not present

## 2022-12-20 DIAGNOSIS — D689 Coagulation defect, unspecified: Secondary | ICD-10-CM | POA: Diagnosis not present

## 2022-12-20 DIAGNOSIS — E875 Hyperkalemia: Secondary | ICD-10-CM | POA: Diagnosis not present

## 2022-12-20 DIAGNOSIS — N186 End stage renal disease: Secondary | ICD-10-CM | POA: Diagnosis not present

## 2022-12-22 DIAGNOSIS — N2581 Secondary hyperparathyroidism of renal origin: Secondary | ICD-10-CM | POA: Diagnosis not present

## 2022-12-22 DIAGNOSIS — Z992 Dependence on renal dialysis: Secondary | ICD-10-CM | POA: Diagnosis not present

## 2022-12-22 DIAGNOSIS — D689 Coagulation defect, unspecified: Secondary | ICD-10-CM | POA: Diagnosis not present

## 2022-12-22 DIAGNOSIS — E875 Hyperkalemia: Secondary | ICD-10-CM | POA: Diagnosis not present

## 2022-12-22 DIAGNOSIS — N186 End stage renal disease: Secondary | ICD-10-CM | POA: Diagnosis not present

## 2022-12-25 DIAGNOSIS — E875 Hyperkalemia: Secondary | ICD-10-CM | POA: Diagnosis not present

## 2022-12-25 DIAGNOSIS — Z992 Dependence on renal dialysis: Secondary | ICD-10-CM | POA: Diagnosis not present

## 2022-12-25 DIAGNOSIS — N186 End stage renal disease: Secondary | ICD-10-CM | POA: Diagnosis not present

## 2022-12-25 DIAGNOSIS — D689 Coagulation defect, unspecified: Secondary | ICD-10-CM | POA: Diagnosis not present

## 2022-12-25 DIAGNOSIS — N2581 Secondary hyperparathyroidism of renal origin: Secondary | ICD-10-CM | POA: Diagnosis not present

## 2022-12-27 DIAGNOSIS — Z992 Dependence on renal dialysis: Secondary | ICD-10-CM | POA: Diagnosis not present

## 2022-12-27 DIAGNOSIS — D689 Coagulation defect, unspecified: Secondary | ICD-10-CM | POA: Diagnosis not present

## 2022-12-27 DIAGNOSIS — E875 Hyperkalemia: Secondary | ICD-10-CM | POA: Diagnosis not present

## 2022-12-27 DIAGNOSIS — N2581 Secondary hyperparathyroidism of renal origin: Secondary | ICD-10-CM | POA: Diagnosis not present

## 2022-12-27 DIAGNOSIS — N186 End stage renal disease: Secondary | ICD-10-CM | POA: Diagnosis not present

## 2022-12-29 DIAGNOSIS — N186 End stage renal disease: Secondary | ICD-10-CM | POA: Diagnosis not present

## 2022-12-29 DIAGNOSIS — E875 Hyperkalemia: Secondary | ICD-10-CM | POA: Diagnosis not present

## 2022-12-29 DIAGNOSIS — Z992 Dependence on renal dialysis: Secondary | ICD-10-CM | POA: Diagnosis not present

## 2022-12-29 DIAGNOSIS — N2581 Secondary hyperparathyroidism of renal origin: Secondary | ICD-10-CM | POA: Diagnosis not present

## 2022-12-29 DIAGNOSIS — D689 Coagulation defect, unspecified: Secondary | ICD-10-CM | POA: Diagnosis not present

## 2022-12-29 DIAGNOSIS — I129 Hypertensive chronic kidney disease with stage 1 through stage 4 chronic kidney disease, or unspecified chronic kidney disease: Secondary | ICD-10-CM | POA: Diagnosis not present

## 2023-01-01 DIAGNOSIS — N2581 Secondary hyperparathyroidism of renal origin: Secondary | ICD-10-CM | POA: Diagnosis not present

## 2023-01-01 DIAGNOSIS — N186 End stage renal disease: Secondary | ICD-10-CM | POA: Diagnosis not present

## 2023-01-01 DIAGNOSIS — Z992 Dependence on renal dialysis: Secondary | ICD-10-CM | POA: Diagnosis not present

## 2023-01-01 DIAGNOSIS — E875 Hyperkalemia: Secondary | ICD-10-CM | POA: Diagnosis not present

## 2023-01-01 DIAGNOSIS — E7800 Pure hypercholesterolemia, unspecified: Secondary | ICD-10-CM | POA: Diagnosis not present

## 2023-01-01 DIAGNOSIS — D689 Coagulation defect, unspecified: Secondary | ICD-10-CM | POA: Diagnosis not present

## 2023-01-02 ENCOUNTER — Ambulatory Visit (INDEPENDENT_AMBULATORY_CARE_PROVIDER_SITE_OTHER): Payer: Medicare Other | Admitting: Nurse Practitioner

## 2023-01-02 ENCOUNTER — Encounter: Payer: Self-pay | Admitting: Nurse Practitioner

## 2023-01-02 VITALS — BP 148/88 | HR 99 | Temp 97.9°F | Ht 62.0 in | Wt 204.0 lb

## 2023-01-02 DIAGNOSIS — Z992 Dependence on renal dialysis: Secondary | ICD-10-CM

## 2023-01-02 DIAGNOSIS — I5032 Chronic diastolic (congestive) heart failure: Secondary | ICD-10-CM

## 2023-01-02 DIAGNOSIS — J449 Chronic obstructive pulmonary disease, unspecified: Secondary | ICD-10-CM | POA: Diagnosis not present

## 2023-01-02 DIAGNOSIS — J410 Simple chronic bronchitis: Secondary | ICD-10-CM

## 2023-01-02 DIAGNOSIS — N186 End stage renal disease: Secondary | ICD-10-CM | POA: Diagnosis not present

## 2023-01-02 DIAGNOSIS — I739 Peripheral vascular disease, unspecified: Secondary | ICD-10-CM

## 2023-01-02 DIAGNOSIS — I110 Hypertensive heart disease with heart failure: Secondary | ICD-10-CM

## 2023-01-02 DIAGNOSIS — Z720 Tobacco use: Secondary | ICD-10-CM | POA: Diagnosis not present

## 2023-01-02 DIAGNOSIS — Z5320 Procedure and treatment not carried out because of patient's decision for unspecified reasons: Secondary | ICD-10-CM

## 2023-01-02 DIAGNOSIS — I132 Hypertensive heart and chronic kidney disease with heart failure and with stage 5 chronic kidney disease, or end stage renal disease: Secondary | ICD-10-CM

## 2023-01-02 MED ORDER — FLUTICASONE-SALMETEROL 115-21 MCG/ACT IN AERO: 2.0000 | INHALATION_SPRAY | Freq: Two times a day (BID) | RESPIRATORY_TRACT | 12 refills | Status: DC

## 2023-01-02 NOTE — Progress Notes (Signed)
I,Whitney Walker,acting as a Education administrator for [employer], FNP.,have documented all relevant documentation on the behalf of Whitney Brine, FNP,as directed by  Whitney Brine, FNP while in the presence of Whitney Walker, Whitney Walker.  Subjective:     Patient ID: Whitney Walker , female    DOB: 1956-02-05 , 67 y.o.   MRN: [id number]   Chief Complaint  Patient presents with   Hypertension    HPI  Patient presents today for a htn follow up.  She is using her albuterol inhaler 3-4 times a day on a weekly basis. She is also having coughing, no secretions. She was also seen by a pulmonologist prior to relocating to [place] over 25 years ago, she has also seen Dr Melvyn Novas once in [id number] but would like to see someone closer to her house. She has been on advair in the past.   She is having her cholesterol checked by Dr Nadyne Coombes  Hypertension This is a chronic problem. The current episode started more than 1 year ago. The problem is controlled. Pertinent negatives include no anxiety, headaches, palpitations or peripheral edema. Past treatments include calcium channel blockers. Compliance problems include exercise.  Identifiable causes of hypertension include chronic renal disease. There is no history of sleep apnea.     Past Medical History:  Diagnosis Date   Anemia    Anxiety    CHF (congestive heart failure) (HCC)    Colon cancer (HCC)    treatment surgery   Complication of anesthesia    after first C- [place] "couldnt walk after", patient denies having a spinal   COPD (chronic obstructive pulmonary disease) ([place])    Coronary artery disease    Depression    DVT (deep venous thrombosis) (HCC)    ESRD on hemodialysis ([place])    Hemo: MWF   History of blood transfusion 04/2018   Hypertension    07/07/18- no longer takes BP medications   Meningitis    Pain in limb 07/30/2013   PE (pulmonary embolism)    Peripheral vascular disease (HCC)    Restless legs    Shortness of breath    with exertion   Sleep apnea     SOB (shortness of breath) 03/03/2019   Vertigo      Family History  Problem Relation Age of Onset   Cancer Mother 58       breast and bone   Cancer Father 53       prostate   Hypertension Sister    Bleeding Disorder Sister    Cancer Cousin 74       breast cancer    Hypertension Daughter    Breast cancer Neg Hx      Current Outpatient Medications:    fluticasone-salmeterol (ADVAIR HFA) 115-21 MCG/ACT inhaler, Inhale 2 puffs into the lungs 2 (two) times daily., Disp: 1 each, Rfl: 12   acetaminophen (TYLENOL) 325 MG tablet, Take 650 mg by mouth daily as needed for headache., Disp: , Rfl:    acetaminophen (TYLENOL) 650 MG CR tablet, Take 650 mg by mouth 3 (three) times daily as needed (arthritis)., Disp: , Rfl:    albuterol (VENTOLIN HFA) 108 (90 Base) MCG/ACT inhaler, Inhale 2 puffs into the lungs every 6 (six) hours as needed for wheezing or shortness of breath., Disp: 18 g, Rfl: 2   apixaban (ELIQUIS) 5 MG TABS tablet, Take 1 tablet (5 mg total) by mouth 2 (two) times daily., Disp: 60 tablet, Rfl: 2   Ascorbic Acid (VITAMIN C) 100 MG  tablet, Take 100 mg by mouth daily., Disp: , Rfl:    atorvastatin (LIPITOR) 20 MG tablet, Take 0.5 tablets (10 mg total) by mouth daily. (Patient taking differently: Take 10 mg by mouth at bedtime.), Disp: 90 tablet, Rfl: 1   AURYXIA 1 GM 210 MG(Fe) tablet, Take 420-840 mg by mouth See admin instructions. Take '840mg'$  by mouth three times daily with meals and '420mg'$  with snacks., Disp: , Rfl:    BIOTIN SL, Place 10,000 mLs under the tongue daily., Disp: , Rfl:    cromolyn (OPTICROM) 4 % ophthalmic solution, Place 1 drop into both eyes 4 (four) times daily., Disp: , Rfl:    diclofenac Sodium (VOLTAREN) 1 % GEL, Apply 2 g topically daily as needed (pain)., Disp: , Rfl:    Doxercalciferol (HECTOROL IV), , Disp: , Rfl:    ezetimibe (ZETIA) 10 MG tablet, Take 1 tablet (10 mg total) by mouth daily. (Patient not taking: Reported on 12/06/2022), Disp: 90 tablet, Rfl:  3   fluticasone (FLONASE) 50 MCG/ACT nasal spray, Place 2 sprays into both nostrils daily. (Patient taking differently: Place 2 sprays into both nostrils daily as needed for allergies.), Disp: 18.2 mL, Rfl: 2   meclizine (ANTIVERT) 12.5 MG tablet, Take 1 tablet (12.5 mg total) by mouth 3 (three) times daily as needed for dizziness., Disp: 30 tablet, Rfl: 0   metoprolol succinate (TOPROL-XL) 25 MG 24 hr tablet, Take 1 tablet (25 mg total) by mouth daily. (Patient taking differently: Take 25 mg by mouth daily. On the days she does not go to dialysis Tues, thrus, Saturday and sunday), Disp: 90 tablet, Rfl: 1   multivitamin (RENA-VIT) TABS tablet, Take 1 tablet by mouth daily., Disp: , Rfl:    omeprazole (PRILOSEC) 20 MG capsule, Take 1 capsule (20 mg total) by mouth daily., Disp: 90 capsule, Rfl: 1   vitamin E 200 UNIT capsule, Take 200 Units by mouth daily., Disp: , Rfl:    Allergies  Allergen Reactions   Carnosine     Other reaction(s): Unknown   Gadolinium Derivatives Hives and Other (See Comments)    HIVES, Desc: HIVES W/ "DYE" USED FOR 1ST CT SCAN BUT NOT 2ND, NO PREMEDS USED, PT UNCERTAIN OF CIRCUMSTANCES,,?POSSIBLE MRI CONTRAST ALLERGY, ALL STUDIES DONE "SOMEWHERE" IN [place]//A.C., Onset Date: 89211941   Iohexol Hives and Other (See Comments)    Desc: HIVES W/ "DYE" USED FOR 1ST CT SCAN BUT NOT 2ND, NO PREMEDS USED, PT UNCERTAIN OF CIRCUMSTANCES,,?POSSIBLE MRI CONTRAST ALLERGY, ALL STUDIES DONE "SOMEWHERE" IN [place]//A.C., Onset Date: [id number]    Iodinated Contrast Media Hives    1974   Iodine Hives    1974   Naltrexone Other (See Comments)    Unknown 1974      Review of Systems  Constitutional: Negative.   Respiratory: Negative.    Cardiovascular: Negative.  Negative for palpitations.  Gastrointestinal: Negative.   Neurological: Negative.  Negative for headaches.     Today's Vitals   01/02/23 1414  BP: (!) 148/88  Pulse: 99  Temp: 97.9 F (36.6 C)  TempSrc:  Oral  Weight: 204 lb (92.5 kg)  Height: '5\' 2"'$  (1.575 m)   Body mass index is 37.31 kg/m.   Objective:  Physical Exam Vitals reviewed.  Constitutional:      General: She is not in acute distress.    Appearance: Normal appearance. She is obese.  Cardiovascular:     Rate and Rhythm: Normal rate and regular rhythm.     Pulses: Normal pulses.  Heart sounds: Normal heart sounds. No murmur heard. Pulmonary:     Effort: Pulmonary effort is normal. No respiratory distress.     Breath sounds: Normal breath sounds. No wheezing.  Skin:    General: Skin is warm and dry.     Capillary Refill: Capillary refill takes less than 2 seconds.     Comments: Vas cath present  Neurological:     General: No focal deficit present.     Mental Status: She is alert and oriented to person, place, and time.     Cranial Nerves: No cranial nerve deficit.     Motor: No weakness.  Psychiatric:        Mood and Affect: Mood normal.        Behavior: Behavior normal.        Thought Content: Thought content normal.        Judgment: Judgment normal.         Assessment And Plan:     1. Malignant hypertension with heart failure and stage V renal dis ([place]) Comments: Blood pressure is elevated today and she continues to go to Dialysis 3 days a week. Continue current medications and f/u with Cardiology  2. Chronic obstructive pulmonary disease, unspecified COPD type ([place]) Comments: Will treat with Advair as she is awaiting appointment with pulmonology - Ambulatory referral to Pulmonology - fluticasone-salmeterol (ADVAIR HFA) 115-21 MCG/ACT inhaler; Inhale 2 puffs into the lungs 2 (two) times daily.  Dispense: 1 each; Refill: 12  3. Hypertensive heart disease with chronic diastolic congestive heart failure [facility]) Comments: She has had recent kidney function checked in December 2024.  Routinely has done at dialysis  4. ESRD (end stage renal disease) on dialysis ([place])  5. PAD (peripheral artery disease)  ([place])  6. Morbid obesity ([place]) She is encouraged to strive for BMI less than 30 to decrease cardiac risk. Advised to aim for at least 150 minutes of exercise per week.  7. Tobacco abuse Comments: Declines routine CT scan low-dose lung - Ambulatory referral to Pulmonology - fluticasone-salmeterol (ADVAIR HFA) 115-21 MCG/ACT inhaler; Inhale 2 puffs into the lungs 2 (two) times daily.  Dispense: 1 each; Refill: 12  8. Treatment declined by patient Comments: Patient declines to have a CT scan of the lung done and bone density.  Expresses being frustrated due to her dialysis     Patient was given opportunity to ask questions. Patient verbalized understanding of the plan and was able to repeat key elements of the plan. All questions were answered to their satisfaction.  Whitney Brine, FNP   I, Whitney Brine, FNP, have reviewed all documentation for this visit. The documentation on 01/02/23 for the exam, diagnosis, procedures, and orders are all accurate and complete.   IF YOU HAVE BEEN REFERRED TO A SPECIALIST, IT MAY TAKE 1-2 WEEKS TO SCHEDULE/PROCESS THE REFERRAL. IF YOU HAVE NOT HEARD FROM US/SPECIALIST IN TWO WEEKS, PLEASE GIVE [place] A CALL AT [phone].   THE PATIENT IS ENCOURAGED TO PRACTICE SOCIAL DISTANCING DUE TO THE COVID-19 PANDEMIC.

## 2023-01-02 NOTE — Patient Instructions (Signed)
Hypertension, Adult High blood pressure (hypertension) is when the force of blood pumping through the arteries is too strong. The arteries are the blood vessels that carry blood from the heart throughout the body. Hypertension forces the heart to work harder to pump blood and may cause arteries to become narrow or stiff. Untreated or uncontrolled hypertension can lead to a heart attack, heart failure, a stroke, kidney disease, and other problems. A blood pressure reading consists of a higher number over a lower number. Ideally, your blood pressure should be below 120/80. The first ("top") number is called the systolic pressure. It is a measure of the pressure in your arteries as your heart beats. The second ("bottom") number is called the diastolic pressure. It is a measure of the pressure in your arteries as the heart relaxes. What are the causes? The exact cause of this condition is not known. There are some conditions that result in high blood pressure. What increases the risk? Certain factors may make you more likely to develop high blood pressure. Some of these risk factors are under your control, including: Smoking. Not getting enough exercise or physical activity. Being overweight. Having too much fat, sugar, calories, or salt (sodium) in your diet. Drinking too much alcohol. Other risk factors include: Having a personal history of heart disease, diabetes, high cholesterol, or kidney disease. Stress. Having a family history of high blood pressure and high cholesterol. Having obstructive sleep apnea. Age. The risk increases with age. What are the signs or symptoms? High blood pressure may not cause symptoms. Very high blood pressure (hypertensive crisis) may cause: Headache. Fast or irregular heartbeats (palpitations). Shortness of breath. Nosebleed. Nausea and vomiting. Vision changes. Severe chest pain, dizziness, and seizures. How is this diagnosed? This condition is diagnosed by  measuring your blood pressure while you are seated, with your arm resting on a flat surface, your legs uncrossed, and your feet flat on the floor. The cuff of the blood pressure monitor will be placed directly against the skin of your upper arm at the level of your heart. Blood pressure should be measured at least twice using the same arm. Certain conditions can cause a difference in blood pressure between your right and left arms. If you have a high blood pressure reading during one visit or you have normal blood pressure with other risk factors, you may be asked to: Return on a different day to have your blood pressure checked again. Monitor your blood pressure at home for 1 week or longer. If you are diagnosed with hypertension, you may have other blood or imaging tests to help your health care provider understand your overall risk for other conditions. How is this treated? This condition is treated by making healthy lifestyle changes, such as eating healthy foods, exercising more, and reducing your alcohol intake. You may be referred for counseling on a healthy diet and physical activity. Your health care provider may prescribe medicine if lifestyle changes are not enough to get your blood pressure under control and if: Your systolic blood pressure is above 130. Your diastolic blood pressure is above 80. Your personal target blood pressure may vary depending on your medical conditions, your age, and other factors. Follow these instructions at home: Eating and drinking  Eat a diet that is high in fiber and potassium, and low in sodium, added sugar, and fat. An example of this eating plan is called the DASH diet. DASH stands for Dietary Approaches to Stop Hypertension. To eat this way: Eat   plenty of fresh fruits and vegetables. Try to fill one half of your plate at each meal with fruits and vegetables. Eat whole grains, such as whole-wheat pasta, brown rice, or whole-grain bread. Fill about one  fourth of your plate with whole grains. Eat or drink low-fat dairy products, such as skim milk or low-fat yogurt. Avoid fatty cuts of meat, processed or cured meats, and poultry with skin. Fill about one fourth of your plate with lean proteins, such as fish, chicken without skin, beans, eggs, or tofu. Avoid pre-made and processed foods. These tend to be higher in sodium, added sugar, and fat. Reduce your daily sodium intake. Many people with hypertension should eat less than 1,500 mg of sodium a day. Do not drink alcohol if: Your health care provider tells you not to drink. You are pregnant, may be pregnant, or are planning to become pregnant. If you drink alcohol: Limit how much you have to: 0-1 drink a day for women. 0-2 drinks a day for men. Know how much alcohol is in your drink. In the [place] one drink equals one 12 oz bottle of beer (355 mL), one 5 oz glass of wine (148 mL), or one 1 oz glass of hard liquor (44 mL). Lifestyle  Work with your health care provider to maintain a healthy body weight or to lose weight. Ask what an ideal weight is for you. Get at least 30 minutes of exercise that causes your heart to beat faster (aerobic exercise) most days of the week. Activities may include walking, swimming, or biking. Include exercise to strengthen your muscles (resistance exercise), such as Pilates or lifting weights, as part of your weekly exercise routine. Try to do these types of exercises for 30 minutes at least 3 days a week. Do not use any products that contain nicotine or tobacco. These products include cigarettes, chewing tobacco, and vaping devices, such as e-cigarettes. If you need help quitting, ask your health care provider. Monitor your blood pressure at home as told by your health care provider. Keep all follow-up visits. This is important. Medicines Take over-the-counter and prescription medicines only as told by your health care provider. Follow directions carefully. Blood  pressure medicines must be taken as prescribed. Do not skip doses of blood pressure medicine. Doing this puts you at risk for problems and can make the medicine less effective. Ask your health care provider about side effects or reactions to medicines that you should watch for. Contact a health care provider if you: Think you are having a reaction to a medicine you are taking. Have headaches that keep coming back (recurring). Feel dizzy. Have swelling in your ankles. Have trouble with your vision. Get help right away if you: Develop a severe headache or confusion. Have unusual weakness or numbness. Feel faint. Have severe pain in your chest or abdomen. Vomit repeatedly. Have trouble breathing. These symptoms may be an emergency. Get help right away. Call 911. Do not wait to see if the symptoms will go away. Do not drive yourself to the hospital. Summary Hypertension is when the force of blood pumping through your arteries is too strong. If this condition is not controlled, it may put you at risk for serious complications. Your personal target blood pressure may vary depending on your medical conditions, your age, and other factors. For most people, a normal blood pressure is less than 120/80. Hypertension is treated with lifestyle changes, medicines, or a combination of both. Lifestyle changes include losing weight, eating a healthy,   low-sodium diet, exercising more, and limiting alcohol. This information is not intended to replace advice given to you by your health care provider. Make sure you discuss any questions you have with your health care provider. Document Revised: 10/23/2021 Document Reviewed: 10/23/2021 Elsevier Patient Education  2023 [employer] Inc.  

## 2023-01-03 DIAGNOSIS — N186 End stage renal disease: Secondary | ICD-10-CM | POA: Diagnosis not present

## 2023-01-03 DIAGNOSIS — E875 Hyperkalemia: Secondary | ICD-10-CM | POA: Diagnosis not present

## 2023-01-03 DIAGNOSIS — Z992 Dependence on renal dialysis: Secondary | ICD-10-CM | POA: Diagnosis not present

## 2023-01-03 DIAGNOSIS — D689 Coagulation defect, unspecified: Secondary | ICD-10-CM | POA: Diagnosis not present

## 2023-01-03 DIAGNOSIS — E7800 Pure hypercholesterolemia, unspecified: Secondary | ICD-10-CM | POA: Diagnosis not present

## 2023-01-03 DIAGNOSIS — N2581 Secondary hyperparathyroidism of renal origin: Secondary | ICD-10-CM | POA: Diagnosis not present

## 2023-01-06 DIAGNOSIS — N2581 Secondary hyperparathyroidism of renal origin: Secondary | ICD-10-CM | POA: Diagnosis not present

## 2023-01-06 DIAGNOSIS — Z992 Dependence on renal dialysis: Secondary | ICD-10-CM | POA: Diagnosis not present

## 2023-01-06 DIAGNOSIS — E7800 Pure hypercholesterolemia, unspecified: Secondary | ICD-10-CM | POA: Diagnosis not present

## 2023-01-06 DIAGNOSIS — D689 Coagulation defect, unspecified: Secondary | ICD-10-CM | POA: Diagnosis not present

## 2023-01-06 DIAGNOSIS — E875 Hyperkalemia: Secondary | ICD-10-CM | POA: Diagnosis not present

## 2023-01-06 DIAGNOSIS — N186 End stage renal disease: Secondary | ICD-10-CM | POA: Diagnosis not present

## 2023-01-08 DIAGNOSIS — N2581 Secondary hyperparathyroidism of renal origin: Secondary | ICD-10-CM | POA: Diagnosis not present

## 2023-01-08 DIAGNOSIS — E7800 Pure hypercholesterolemia, unspecified: Secondary | ICD-10-CM | POA: Diagnosis not present

## 2023-01-08 DIAGNOSIS — E875 Hyperkalemia: Secondary | ICD-10-CM | POA: Diagnosis not present

## 2023-01-08 DIAGNOSIS — N186 End stage renal disease: Secondary | ICD-10-CM | POA: Diagnosis not present

## 2023-01-08 DIAGNOSIS — D689 Coagulation defect, unspecified: Secondary | ICD-10-CM | POA: Diagnosis not present

## 2023-01-08 DIAGNOSIS — Z992 Dependence on renal dialysis: Secondary | ICD-10-CM | POA: Diagnosis not present

## 2023-01-10 DIAGNOSIS — Z992 Dependence on renal dialysis: Secondary | ICD-10-CM | POA: Diagnosis not present

## 2023-01-10 DIAGNOSIS — E7800 Pure hypercholesterolemia, unspecified: Secondary | ICD-10-CM | POA: Diagnosis not present

## 2023-01-10 DIAGNOSIS — E875 Hyperkalemia: Secondary | ICD-10-CM | POA: Diagnosis not present

## 2023-01-10 DIAGNOSIS — N186 End stage renal disease: Secondary | ICD-10-CM | POA: Diagnosis not present

## 2023-01-10 DIAGNOSIS — N2581 Secondary hyperparathyroidism of renal origin: Secondary | ICD-10-CM | POA: Diagnosis not present

## 2023-01-10 DIAGNOSIS — D689 Coagulation defect, unspecified: Secondary | ICD-10-CM | POA: Diagnosis not present

## 2023-01-13 DIAGNOSIS — E875 Hyperkalemia: Secondary | ICD-10-CM | POA: Diagnosis not present

## 2023-01-13 DIAGNOSIS — N186 End stage renal disease: Secondary | ICD-10-CM | POA: Diagnosis not present

## 2023-01-13 DIAGNOSIS — N2581 Secondary hyperparathyroidism of renal origin: Secondary | ICD-10-CM | POA: Diagnosis not present

## 2023-01-13 DIAGNOSIS — E7800 Pure hypercholesterolemia, unspecified: Secondary | ICD-10-CM | POA: Diagnosis not present

## 2023-01-13 DIAGNOSIS — Z992 Dependence on renal dialysis: Secondary | ICD-10-CM | POA: Diagnosis not present

## 2023-01-13 DIAGNOSIS — D689 Coagulation defect, unspecified: Secondary | ICD-10-CM | POA: Diagnosis not present

## 2023-01-14 ENCOUNTER — Encounter: Payer: Self-pay | Admitting: Cardiology

## 2023-01-15 DIAGNOSIS — E7800 Pure hypercholesterolemia, unspecified: Secondary | ICD-10-CM | POA: Diagnosis not present

## 2023-01-15 DIAGNOSIS — N186 End stage renal disease: Secondary | ICD-10-CM | POA: Diagnosis not present

## 2023-01-15 DIAGNOSIS — D689 Coagulation defect, unspecified: Secondary | ICD-10-CM | POA: Diagnosis not present

## 2023-01-15 DIAGNOSIS — N2581 Secondary hyperparathyroidism of renal origin: Secondary | ICD-10-CM | POA: Diagnosis not present

## 2023-01-15 DIAGNOSIS — E875 Hyperkalemia: Secondary | ICD-10-CM | POA: Diagnosis not present

## 2023-01-15 DIAGNOSIS — Z992 Dependence on renal dialysis: Secondary | ICD-10-CM | POA: Diagnosis not present

## 2023-01-16 ENCOUNTER — Ambulatory Visit: Payer: Medicaid Other | Admitting: Cardiology

## 2023-01-16 ENCOUNTER — Encounter: Payer: Self-pay | Admitting: Cardiology

## 2023-01-16 VITALS — BP 109/69 | HR 80 | Ht 62.0 in | Wt 204.6 lb

## 2023-01-16 DIAGNOSIS — E7800 Pure hypercholesterolemia, unspecified: Secondary | ICD-10-CM

## 2023-01-16 DIAGNOSIS — I6523 Occlusion and stenosis of bilateral carotid arteries: Secondary | ICD-10-CM | POA: Diagnosis not present

## 2023-01-16 DIAGNOSIS — I951 Orthostatic hypotension: Secondary | ICD-10-CM | POA: Diagnosis not present

## 2023-01-16 DIAGNOSIS — I739 Peripheral vascular disease, unspecified: Secondary | ICD-10-CM | POA: Diagnosis not present

## 2023-01-16 MED ORDER — MIDODRINE HCL 10 MG PO TABS: 10.0000 mg | ORAL_TABLET | ORAL | 2 refills | Status: DC

## 2023-01-16 MED ORDER — FLUDROCORTISONE ACETATE 0.1 MG PO TABS: 0.1000 mg | ORAL_TABLET | ORAL | 2 refills | Status: DC

## 2023-01-16 MED ORDER — ATORVASTATIN CALCIUM 40 MG PO TABS: 40.0000 mg | ORAL_TABLET | Freq: Every day | ORAL | 3 refills | Status: DC

## 2023-01-16 NOTE — Progress Notes (Signed)
Primary Physician/Referring:  [name], FNP  Patient ID: Whitney Walker, female    DOB: 1956-02-17, 67 y.o.   MRN: [id number]  Chief Complaint  Patient presents with   Orthostatic hypotension   Hypertension   Coronary artery disease involving native coronary artery of   Follow-up   HPI:    Whitney Walker  is a 67 y.o. African-American female with history of obesity, hypertension, hyperlipidemia, tobacco use, obstructive sleep apnea not on CPAP, COPD and severe peripheral arterial disease with history of left femoral-popliteal bypass surgery in 2014, on chronic anticoagulation with Eliquis due to recurrent DVT and PE in 2014 and 2016, ESRD on hemodialysis on  Monday/Wednesday/Friday.  Patient presents here for 12-monthoffice visit and follow-up of orthostatic hypotension.  Previously patient had hypertension, since development of end-stage renal disease she has developed hypotension especially orthostatic hypotension.   Previously was on midodrine, the medication has been restarted as her blood pressure drops very low during the days of dialysis however patient discontinued this as it was not effective.  Otherwise patient is doing well, denies any chest pain, denies symptoms of claudication.  Past Medical History:  Diagnosis Date   Anemia    Anxiety    [place]    [place]    treatment [place]   Complication of anesthesia    after first [facility] [place] "couldnt walk after", patient denies having a spinal   [place]    Coronary artery disease    Depression    DVT (deep venous thrombosis) (HCC)    [place]    Hemo: MWF   History of blood transfusion 04/2018   Hypertension    07/07/18- no longer takes BP medications   Meningitis    Pain in limb 07/30/2013   PE (pulmonary embolism)    Peripheral vascular disease (HCC)    Restless legs    Shortness of breath    with  exertion   Sleep apnea    SOB (shortness of breath) 03/03/2019   Vertigo      Social History   Tobacco Use   Smoking status: Former    Packs/day: 0.50    Years: 50.00    Total pack years: 25.00    Types: Cigarettes    Quit date: 10/13/2022    Years since quitting: 0.2   Smokeless tobacco: Former   Tobacco comments:    She is using a vape when she runs out of her cigarettes, 7/27 - down to 1-2 cigs a day  Substance Use Topics   Alcohol use: No    Alcohol/week: 0.0 standard drinks of alcohol    Comment: she used to drink alcohol, quit in 2010; h/o heavy use    Marital Status: Single   ROS  Review of Systems  Cardiovascular:  Negative for chest pain, dyspnea on exertion and leg swelling.  Neurological:  Positive for dizziness.   Objective  Blood pressure 109/69, pulse 80, height '5\' 2"'$  (1.575 m), weight 204 lb 9.6 oz (92.8 kg), SpO2 94 %.     01/16/2023   10:57 AM 01/02/2023    2:14 PM 12/10/2022    1:00 PM  Vitals with BMI  Height '5\' 2"'$  '5\' 2"'$    Weight 204 lbs 10 oz 204 lbs   BMI 341.32344.0  Systolic [id number] Diastolic 69 88 70  Pulse 80 99 81    Orthostatic VS for the past  72 hrs (Last 3 readings):  Patient Position BP Location Cuff Size  01/16/23 1057 Sitting Left Arm Large    Physical Exam Neck:     Vascular: Carotid bruit (left) present. No JVD.  Cardiovascular:     Rate and Rhythm: Normal rate and regular rhythm.     Pulses:          Dorsalis pedis pulses are 0 on the right side and 0 on the left side.       Posterior tibial pulses are 0 on the right side and 0 on the left side.     Heart sounds: Normal heart sounds. No murmur heard.    No gallop.  Pulmonary:     Effort: Pulmonary effort is normal.     Breath sounds: Normal breath sounds.  Abdominal:     General: Bowel sounds are normal.     Palpations: Abdomen is soft.  Musculoskeletal:     Right lower leg: No edema.     Left lower leg: No edema.    Laboratory examination:   Recent Labs     05/18/22 0433 05/19/22 2007 05/20/22 0131 05/30/22 1604 12/10/22 1200  NA 135 131* 134* 139 136  K 5.0 4.2 4.6 4.6 4.5  CL 102 100 103 101 96*  CO2 20* 18* 19* 21  --   GLUCOSE 100* 145* 122* 118* 95  BUN 24* 36* 36* 30* 32*  CREATININE 5.49* 9.05* 9.12* 7.91* 9.10*  CALCIUM 8.4* 8.5* 8.7* 9.3  --   GFRNONAA 8* 4* 4*  --   --    Lab Results  Component Value Date   ALT 14 05/17/2022   AST 25 05/17/2022   ALKPHOS 89 05/17/2022   BILITOT 0.8 05/17/2022       Latest Ref Rng & Units 12/10/2022   12:00 PM 05/30/2022    4:04 PM 05/22/2022    2:04 AM  CBC  WBC 3.4 - 10.8 x10E3/uL  12.3  20.6   Hemoglobin 12.0 - 15.0 g/dL 13.9  10.3  9.6   Hematocrit 36.0 - 46.0 % 41.0  31.8  28.7   Platelets 150 - 450 x10E3/uL  285  299    Lab Results  Component Value Date   CHOL 183 03/15/2021   HDL 55 03/15/2021   LDLCALC 100 (H) 03/15/2021   TRIG 159 (H) 03/15/2021   CHOLHDL 3.3 03/15/2021    HEMOGLOBIN A1C Lab Results  Component Value Date   HGBA1C 5.9 (H) 11/15/2021   MPG 131.24 07/16/2021   TSH Lab Results  Component Value Date   TSH 1.57 06/04/2018     Allergies   Allergies  Allergen Reactions   Carnosine     Other reaction(s): Unknown   Gadolinium Derivatives Hives and Other (See Comments)    HIVES, Desc: HIVES W/ "DYE" USED FOR 1ST CT SCAN BUT NOT 2ND, NO PREMEDS USED, PT UNCERTAIN OF CIRCUMSTANCES,,?POSSIBLE MRI CONTRAST ALLERGY, ALL STUDIES DONE "SOMEWHERE" IN [place]//A.C., Onset Date: [id number]   Iohexol Hives and Other (See Comments)    Desc: HIVES W/ "DYE" USED FOR 1ST CT SCAN BUT NOT 2ND, NO PREMEDS USED, PT UNCERTAIN OF CIRCUMSTANCES,,?POSSIBLE MRI CONTRAST ALLERGY, ALL STUDIES DONE "SOMEWHERE" IN [place]//A.C., Onset Date: [id number]    Iodinated Contrast Media Hives    1974   Iodine Hives    1974   Naltrexone Other (See Comments)    Unknown 1974     Final Medications at End of Visit    Current Outpatient Medications:  acetaminophen  (TYLENOL) 325 MG tablet, Take 650 mg by mouth daily as needed for headache., Disp: , Rfl:    acetaminophen (TYLENOL) 650 MG CR tablet, Take 650 mg by mouth 3 (three) times daily as needed (arthritis)., Disp: , Rfl:    albuterol (VENTOLIN HFA) 108 (90 Base) MCG/ACT inhaler, Inhale 2 puffs into the lungs every 6 (six) hours as needed for wheezing or shortness of breath., Disp: 18 g, Rfl: 2   apixaban (ELIQUIS) 5 MG TABS tablet, Take 1 tablet (5 mg total) by mouth 2 (two) times daily., Disp: 60 tablet, Rfl: 2   Ascorbic Acid (VITAMIN C) 100 MG tablet, Take 100 mg by mouth daily., Disp: , Rfl:    AURYXIA 1 GM 210 MG(Fe) tablet, Take 420-840 mg by mouth See admin instructions. Take '840mg'$  by mouth three times daily with meals and '420mg'$  with snacks., Disp: , Rfl:    BIOTIN SL, Place 10,000 mLs under the tongue daily., Disp: , Rfl:    cromolyn (OPTICROM) 4 % ophthalmic solution, Place 1 drop into both eyes 4 (four) times daily., Disp: , Rfl:    diclofenac Sodium (VOLTAREN) 1 % GEL, Apply 2 g topically daily as needed (pain)., Disp: , Rfl:    Doxercalciferol (HECTOROL IV), , Disp: , Rfl:    fludrocortisone (FLORINEF) 0.1 MG tablet, Take 1 tablet (0.1 mg total) by mouth every other day. Take in evening prior to dialysis days, Disp: 30 tablet, Rfl: 2   fluticasone (FLONASE) 50 MCG/ACT nasal spray, Place 2 sprays into both nostrils daily. (Patient taking differently: Place 2 sprays into both nostrils daily as needed for allergies.), Disp: 18.2 mL, Rfl: 2   fluticasone-salmeterol (ADVAIR HFA) 115-21 MCG/ACT inhaler, Inhale 2 puffs into the lungs 2 (two) times daily., Disp: 1 each, Rfl: 12   meclizine (ANTIVERT) 12.5 MG tablet, Take 1 tablet (12.5 mg total) by mouth 3 (three) times daily as needed for dizziness., Disp: 30 tablet, Rfl: 0   midodrine (PROAMATINE) 10 MG tablet, Take 1 tablet (10 mg total) by mouth every other day. Take on the days of dialysis, Disp: 30 tablet, Rfl: 2   omeprazole (PRILOSEC) 20 MG  capsule, Take 1 capsule (20 mg total) by mouth daily., Disp: 90 capsule, Rfl: 1   vitamin E 200 UNIT capsule, Take 200 Units by mouth daily., Disp: , Rfl:    atorvastatin (LIPITOR) 40 MG tablet, Take 1 tablet (40 mg total) by mouth daily., Disp: 90 tablet, Rfl: 3   Radiology:   No results found.  Cardiac Studies:  Left Heart Catheterization 05/02/22:  RCA: Dominant.  Mild diffuse disease. Left main: Large vessel, very short and immediately bifurcates.  Calcified. LCx: Large vessel, [place] to a very high large OM1, a moderate-sized OM 2 distally, mild to moderate diffuse disease is present.  No high-grade stenosis. LAD: Large vessel, gives coronary to a very large D1, mild diffuse disease again evident without any focal high-grade stenosis.  Diffuse coronary calcification is again evident.  LAD is tortuous in the mid to distal segment suggestive of hypertensive heart disease.  Recommendation: [name] positive cardiac markers probably related to very small vessel disease.  Do not see any obvious dissection in the major coronary vessels or thrombus formation or occlusion.  No significant change in coronary anatomy since prior angiography a year ago in 2022.  Recommend continuing medical therapy.  30 mL contrast utilized.  Echocardiogram 07/17/2021:  1. Left ventricular ejection fraction, by estimation, is 70 to 75%. The  left  ventricle has hyperdynamic function. The left ventricle has no  regional wall motion abnormalities. Left ventricular diastolic parameters  are consistent with Grade I diastolic  dysfunction (impaired relaxation). Elevated left atrial pressure.   2. Right ventricular systolic function is normal. The right ventricular  size is normal. The estimated right ventricular systolic pressure is 48.2  mmHg.   3. Left atrial size was mildly dilated.   4. The mitral valve is normal in structure. No evidence of mitral valve  regurgitation. Moderate mitral annular calcification.    5. The aortic valve is tricuspid. There is mild calcification of the  aortic valve. There is mild thickening of the aortic valve. Aortic valve  regurgitation is not visualized. Mild to moderate aortic valve  sclerosis/calcification is present, without any  evidence of aortic stenosis. Aortic valve mean gradient measures 14.0  mmHg. Aortic valve Vmax measures 2.52 m/s.   6. The inferior vena cava is normal in size with greater than 50%  respiratory variability, suggesting right atrial pressure of 3 mmHg.   PCV MYOCARDIAL PERFUSION WO LEXISCAN 11/20/2020 Lexiscan not administered at patient's request. Incomplete study. EKG tracing not available for review. Rest images show mild decrease in tracer update in basal inferior myocardium. Consider alternate ischemia studies, if clinically indicated.  Carotid artery duplex 07/09/2022: Duplex suggests stenosis in the right internal carotid artery (1-15%). Duplex suggests stenosis in the left internal carotid artery (16-49%). Antegrade right vertebral artery flow. Antegrade left vertebral artery flow. Compared to the study done on 05/08/2021, imaging was difficult and right ICA velocities could not be established and felt to be totally occluded.  However right ICA is widely patent.  Minimal stenosis with mild heterogenous plaque.  No change in the left carotid artery stenosis. Follow up in one year is appropriate if clinically indicated.  EKG:   EKG 10/17/2022: Sinus tachycardia at rate of '1 1 2 '$ bpm, low-voltage complexes otherwise normal.  Assessment     ICD-10-CM   1. Orthostatic hypotension  I95.1 fludrocortisone (FLORINEF) 0.1 MG tablet    midodrine (PROAMATINE) 10 MG tablet    2. Hypercholesterolemia  E78.00 Lipid Panel With LDL/HDL Ratio    atorvastatin (LIPITOR) 40 MG tablet    3. PVD (peripheral vascular disease) with claudication (HCC)  I73.9     4. Asymptomatic bilateral carotid artery stenosis  I65.23 PCV CAROTID DUPLEX  (BILATERAL)     Medications Discontinued During This Encounter  Medication Reason   ezetimibe (ZETIA) 10 MG tablet Side effect (s)   multivitamin (RENA-VIT) TABS tablet Patient Preference   metoprolol succinate (TOPROL-XL) 25 MG 24 hr tablet Discontinued by provider   ezetimibe (ZETIA) 10 MG tablet Side effect (s)   atorvastatin (LIPITOR) 20 MG tablet Reorder    Meds ordered this encounter  Medications   fludrocortisone (FLORINEF) 0.1 MG tablet    Sig: Take 1 tablet (0.1 mg total) by mouth every other day. Take in evening prior to dialysis days    Dispense:  30 tablet    Refill:  2   midodrine (PROAMATINE) 10 MG tablet    Sig: Take 1 tablet (10 mg total) by mouth every other day. Take on the days of dialysis    Dispense:  30 tablet    Refill:  2   atorvastatin (LIPITOR) 40 MG tablet    Sig: Take 1 tablet (40 mg total) by mouth daily.    Dispense:  90 tablet    Refill:  3   Recommendations:   Whitney Walker  is a 67 y.o. African-American female with history of obesity, hypertension, hyperlipidemia, tobacco use, obstructive sleep apnea not on CPAP, COPD and severe peripheral arterial disease with history of left femoral-popliteal bypass surgery in 2014, on chronic anticoagulation with Eliquis due to recurrent DVT and PE in 2014 and 2016, ESRD on hemodialysis on  Monday/Wednesday/Friday.  1. Orthostatic hypotension Patient with severe orthostatic hypotension occurring especially during dialysis and had to discontinue dialysis due to low blood pressure.  Fortunately she has not had any dizziness during nondialysis days.  I will discontinue metoprolol succinate, previously she was hypertensive and now has orthostatic hypotension probably related to autonomic neuropathy from end-stage renal disease.  I will also start her on fludrocortisone 0.1 mg every other day, she will take it on the days before dialysis.  Midodrine was not previously working but hopefully with addition of  fludrocortisone, midodrine will work, she will take midodrine only on the days of dialysis.  2. Hypercholesterolemia She could not tolerate Zetia due to severe headaches.  I will increase the dose of atorvastatin from 20 mg to 40 mg.  We will check lipids probably in 2 to 3 months from now.  3. PVD (peripheral vascular disease) with claudication ([employer]) Patient has severe peripheral artery disease with mild symptoms of claudication however symptoms are remained stable.  No clinical evidence of critical limb ischemia.  4. Asymptomatic bilateral carotid artery stenosis She has bilateral mild asymptomatic carotid stenosis and needs carotid artery surveillance duplex which will be performed in 6 months from now.  Otherwise she is doing well, I will see her back in 4 to 6 weeks for follow-up.  Will check orthostasis on her next office visit.  Smoking cessation again discussed with the patient.  Patient has quit smoking now and he is presently using vape and hopefully she will completely quit vaping as well.   [name], MD, [facility] 01/16/2023, 11:30 AM Office: [phone] Fax: [phone] Pager: [phone]

## 2023-01-17 ENCOUNTER — Telehealth: Payer: Self-pay

## 2023-01-17 ENCOUNTER — Encounter: Payer: Self-pay | Admitting: Cardiology

## 2023-01-17 DIAGNOSIS — N186 End stage renal disease: Secondary | ICD-10-CM | POA: Diagnosis not present

## 2023-01-17 DIAGNOSIS — D689 Coagulation defect, unspecified: Secondary | ICD-10-CM | POA: Diagnosis not present

## 2023-01-17 DIAGNOSIS — E7800 Pure hypercholesterolemia, unspecified: Secondary | ICD-10-CM | POA: Diagnosis not present

## 2023-01-17 DIAGNOSIS — N2581 Secondary hyperparathyroidism of renal origin: Secondary | ICD-10-CM | POA: Diagnosis not present

## 2023-01-17 DIAGNOSIS — E875 Hyperkalemia: Secondary | ICD-10-CM | POA: Diagnosis not present

## 2023-01-17 DIAGNOSIS — Z992 Dependence on renal dialysis: Secondary | ICD-10-CM | POA: Diagnosis not present

## 2023-01-17 NOTE — Telephone Encounter (Signed)
Yes she should. THat is why her labs are good

## 2023-01-17 NOTE — Telephone Encounter (Signed)
Patient asked if she should continue atorvastatin '40mg'$  because she said he labs came back good, please advise

## 2023-01-17 NOTE — Progress Notes (Signed)
Care Management & Coordination Services Pharmacy Team  Reason for Encounter: Medication coordination and delivery  Contacted patient on 01/17/2023 to discuss medications   Recent office visits:  01-02-2023 [name], [place]. Referral placed to pulmonology. START advair 2 puffs into the lungs twice daily.  Recent consult visits:  01-16-2023 [name], MD (Cardiology). START florinef 0.1 mg every other day Take in evening prior to dialysis days, Midodrine 10 mg every other day Take in evening prior to dialysis days. INCREASE atorvastatin 10 mg daily TO 40 mg daily. Patient reported not taking b complex, zetia and metoprolol.  Hospital visits:  None in previous 6 months  Medications: Outpatient Encounter Medications as of 01/17/2023  Medication Sig Note   acetaminophen (TYLENOL) 325 MG tablet Take 650 mg by mouth daily as needed for headache.    acetaminophen (TYLENOL) 650 MG CR tablet Take 650 mg by mouth 3 (three) times daily as needed (arthritis).    albuterol (VENTOLIN HFA) 108 (90 Base) MCG/ACT inhaler Inhale 2 puffs into the lungs every 6 (six) hours as needed for wheezing or shortness of breath.    apixaban (ELIQUIS) 5 MG TABS tablet Take 1 tablet (5 mg total) by mouth 2 (two) times daily.    Ascorbic Acid (VITAMIN C) 100 MG tablet Take 100 mg by mouth daily.    atorvastatin (LIPITOR) 40 MG tablet Take 1 tablet (40 mg total) by mouth daily.    AURYXIA 1 GM 210 MG(Fe) tablet Take 420-840 mg by mouth See admin instructions. Take '840mg'$  by mouth three times daily with meals and '420mg'$  with snacks.    BIOTIN SL Place 10,000 mLs under the tongue daily.    cromolyn (OPTICROM) 4 % ophthalmic solution Place 1 drop into both eyes 4 (four) times daily.    diclofenac Sodium (VOLTAREN) 1 % GEL Apply 2 g topically daily as needed (pain).    Doxercalciferol (HECTOROL IV)  12/06/2022: Given at dialysis   fludrocortisone (FLORINEF) 0.1 MG tablet Take 1 tablet (0.1 mg total) by mouth every other day.  Take in evening prior to dialysis days    fluticasone (FLONASE) 50 MCG/ACT nasal spray Place 2 sprays into both nostrils daily. (Patient taking differently: Place 2 sprays into both nostrils daily as needed for allergies.)    fluticasone-salmeterol (ADVAIR HFA) 115-21 MCG/ACT inhaler Inhale 2 puffs into the lungs 2 (two) times daily.    meclizine (ANTIVERT) 12.5 MG tablet Take 1 tablet (12.5 mg total) by mouth 3 (three) times daily as needed for dizziness.    midodrine (PROAMATINE) 10 MG tablet Take 1 tablet (10 mg total) by mouth every other day. Take on the days of dialysis    omeprazole (PRILOSEC) 20 MG capsule Take 1 capsule (20 mg total) by mouth daily.    vitamin E 200 UNIT capsule Take 200 Units by mouth daily.    No facility-administered encounter medications on file as of 01/17/2023.   BP Readings from Last 3 Encounters:  01/16/23 109/69  01/02/23 (!) 148/88  12/10/22 135/70    Pulse Readings from Last 3 Encounters:  01/16/23 80  01/02/23 99  12/10/22 81    Lab Results  Component Value Date/Time   HGBA1C 5.9 (H) 11/15/2021 11:45 AM   HGBA1C 6.2 (H) 07/16/2021 02:02 PM   Lab Results  Component Value Date   CREATININE 9.10 (H) 12/10/2022   BUN 32 (H) 12/10/2022   GFRNONAA 4 (L) 05/20/2022   GFRAA 7 (L) 05/18/2020   NA 136 12/10/2022   K  4.5 12/10/2022   CALCIUM 9.3 05/30/2022   CO2 21 05/30/2022     Last adherence delivery date: 12-30-2022  Patient is due for next adherence delivery on: 01-29-2023  Spoke with patient on 01-20-2023 reviewed medications and coordinated delivery.  This delivery to include: Vials  30 Days  Eliquis 5 mg twice daily  Albuterol inhaler 2 puffs every 6 hours as needed   Omeprazole 20 mg daily Advair inhaler  Patient declined the following medications this month: Metoprolol- D/C Atorvastatin- [facility] supply. Patient is waiting on cardiology to respond since lab work was good. Cardiology sent in 40 mg before lab results. Midodrine-  filled 01-16-2023   No refill request needed.  Confirmed delivery date of 01-29-2023, advised patient that pharmacy will contact them the morning of delivery.   Any concerns about your medications? No  How often do you forget or accidentally miss a dose? Never  Do you use a pillbox? No  Is patient in packaging No  Any concerns or issues with your packaging? No   Recent blood pressure readings are as follows: 109/69 01-16-2023  Recent blood glucose readings are as follows: 5.9 11-15-2021  Cycle dispensing form sent to [name] for review.   Whitney Walker

## 2023-01-17 NOTE — Progress Notes (Signed)
Labs 01/13/2023:  Total cholesterol 138, triglycerides 152, HDL 43, LDL 65.

## 2023-01-20 DIAGNOSIS — E7800 Pure hypercholesterolemia, unspecified: Secondary | ICD-10-CM | POA: Diagnosis not present

## 2023-01-20 DIAGNOSIS — D689 Coagulation defect, unspecified: Secondary | ICD-10-CM | POA: Diagnosis not present

## 2023-01-20 DIAGNOSIS — N186 End stage renal disease: Secondary | ICD-10-CM | POA: Diagnosis not present

## 2023-01-20 DIAGNOSIS — Z992 Dependence on renal dialysis: Secondary | ICD-10-CM | POA: Diagnosis not present

## 2023-01-20 DIAGNOSIS — N2581 Secondary hyperparathyroidism of renal origin: Secondary | ICD-10-CM | POA: Diagnosis not present

## 2023-01-20 DIAGNOSIS — E875 Hyperkalemia: Secondary | ICD-10-CM | POA: Diagnosis not present

## 2023-01-21 ENCOUNTER — Ambulatory Visit: Payer: Self-pay

## 2023-01-21 NOTE — Patient Instructions (Signed)
Visit Information  Thank you for taking time to visit with me today. Please don't hesitate to contact me if I can be of assistance to you.   Following are the goals we discussed today:   Goals Addressed               This Visit's Progress     Patient Stated     I will consider seeing a Pulmonologist for my COPD (pt-stated)        Care Coordination Interventions: Evaluation of current treatment plan related to COPD and patient's adherence to plan as established by provider Determined patient completed a follow up with PCP [name] FNP for evaluation of treatment management for COPD Review of patient status, including review of consultant's reports, relevant laboratory and other test results, and medications completed Provided instruction about proper use of medications used for management of COPD including inhalers, patient instructed to rinse after each usage of Advair inhaler to help avoid thrush Reviewed scheduled/upcoming provider appointment including: Pulmonology follow up with Dr. Silas Flood with [facility] scheduled for 02/06/23 '@1'$ :30 PM            To understand why my Atorvastatin was increased (pt-stated)        Care Coordination Interventions: Determined patient completed a recent follow up with Dr. Einar Gip, Cardiologist Review of patient status, including review of consultant's reports, relevant laboratory and other test results, and medications completed Reviewed Importance of taking all medications as prescribed Advised patient to discuss new symptoms or concerns with provider Reviewed scheduled/upcoming provider appointments including: next Cardiology follow up appointment with Dr. Einar Gip scheduled for 02/18/23 '@10'$ :30 AM; PCV-ECHO/VAS 1 scheduled for 07/17/23 '@1'$ :00 PM          Our next appointment is by telephone on 03/18/23 at 09:30 AM  Please call the care guide team at [phone] if you need to cancel or reschedule your appointment.   If you are  experiencing a Mental Health or [place] or need someone to talk to, please go to [facility] [address], [place] [phone])  Patient verbalizes understanding of instructions and care plan provided today and agrees to view in [place]. Active MyChart status and patient understanding of how to access instructions and care plan via MyChart confirmed with patient.     [name], RN, BSN, CCM Care Management Coordinator [facility] Care Management Direct Phone: [phone]

## 2023-01-21 NOTE — Patient Outreach (Signed)
  Care Coordination   Follow Up Visit Note   01/21/2023 Name: Whitney Walker MRN: [id number] DOB: [birth year]  Whitney Walker is a 67 y.o. year old female who sees Whitney Brine, FNP for primary care. I spoke with  Whitney Walker by phone today.  What matters to the patients health and wellness today?  Patient would like to understand why her Atorvastatin was increased. She will follow up with Pulmonology as scheduled for evaluation and treatment of COPD.     Goals Addressed               This Visit's Progress     Patient Stated     I will consider seeing a Pulmonologist for my COPD (pt-stated)        Care Coordination Interventions: Evaluation of current treatment plan related to COPD and patient's adherence to plan as established by provider Determined patient completed a follow up with PCP Whitney Brine FNP for evaluation of treatment management for COPD Review of patient status, including review of consultant's reports, relevant laboratory and other test results, and medications completed Provided instruction about proper use of medications used for management of COPD including inhalers, patient instructed to rinse after each usage of Advair inhaler to help avoid thrush Reviewed scheduled/upcoming provider appointment including: Pulmonology follow up with Whitney Walker with [facility] scheduled for 02/06/23 '@1'$ :30 PM            To understand why my Atorvastatin was increased (pt-stated)        Care Coordination Interventions: Determined patient completed a recent follow up with Whitney Walker, Cardiologist Review of patient status, including review of consultant's reports, relevant laboratory and other test results, and medications completed Reviewed Importance of taking all medications as prescribed Advised patient to discuss new symptoms or concerns with provider Reviewed scheduled/upcoming provider appointments including: next Cardiology follow up  appointment with Whitney Walker scheduled for 02/18/23 '@10'$ :30 AM; PCV-ECHO/VAS 1 scheduled for 07/17/23 '@1'$ :00 PM          SDOH assessments and interventions completed:  No     Care Coordination Interventions:  Yes, provided   Follow up plan: Follow up call scheduled for 03/18/23 '@09'$ :30 AM    Encounter Outcome:  Pt. Visit Completed

## 2023-01-22 DIAGNOSIS — D689 Coagulation defect, unspecified: Secondary | ICD-10-CM | POA: Diagnosis not present

## 2023-01-22 DIAGNOSIS — Z992 Dependence on renal dialysis: Secondary | ICD-10-CM | POA: Diagnosis not present

## 2023-01-22 DIAGNOSIS — N186 End stage renal disease: Secondary | ICD-10-CM | POA: Diagnosis not present

## 2023-01-22 DIAGNOSIS — E7800 Pure hypercholesterolemia, unspecified: Secondary | ICD-10-CM | POA: Diagnosis not present

## 2023-01-22 DIAGNOSIS — N2581 Secondary hyperparathyroidism of renal origin: Secondary | ICD-10-CM | POA: Diagnosis not present

## 2023-01-22 DIAGNOSIS — E875 Hyperkalemia: Secondary | ICD-10-CM | POA: Diagnosis not present

## 2023-01-24 DIAGNOSIS — D689 Coagulation defect, unspecified: Secondary | ICD-10-CM | POA: Diagnosis not present

## 2023-01-24 DIAGNOSIS — Z992 Dependence on renal dialysis: Secondary | ICD-10-CM | POA: Diagnosis not present

## 2023-01-24 DIAGNOSIS — E875 Hyperkalemia: Secondary | ICD-10-CM | POA: Diagnosis not present

## 2023-01-24 DIAGNOSIS — N186 End stage renal disease: Secondary | ICD-10-CM | POA: Diagnosis not present

## 2023-01-24 DIAGNOSIS — N2581 Secondary hyperparathyroidism of renal origin: Secondary | ICD-10-CM | POA: Diagnosis not present

## 2023-01-24 DIAGNOSIS — E7800 Pure hypercholesterolemia, unspecified: Secondary | ICD-10-CM | POA: Diagnosis not present

## 2023-01-27 DIAGNOSIS — E875 Hyperkalemia: Secondary | ICD-10-CM | POA: Diagnosis not present

## 2023-01-27 DIAGNOSIS — D689 Coagulation defect, unspecified: Secondary | ICD-10-CM | POA: Diagnosis not present

## 2023-01-27 DIAGNOSIS — N186 End stage renal disease: Secondary | ICD-10-CM | POA: Diagnosis not present

## 2023-01-27 DIAGNOSIS — Z992 Dependence on renal dialysis: Secondary | ICD-10-CM | POA: Diagnosis not present

## 2023-01-27 DIAGNOSIS — E7800 Pure hypercholesterolemia, unspecified: Secondary | ICD-10-CM | POA: Diagnosis not present

## 2023-01-27 DIAGNOSIS — N2581 Secondary hyperparathyroidism of renal origin: Secondary | ICD-10-CM | POA: Diagnosis not present

## 2023-01-29 DIAGNOSIS — D689 Coagulation defect, unspecified: Secondary | ICD-10-CM | POA: Diagnosis not present

## 2023-01-29 DIAGNOSIS — E875 Hyperkalemia: Secondary | ICD-10-CM | POA: Diagnosis not present

## 2023-01-29 DIAGNOSIS — Z992 Dependence on renal dialysis: Secondary | ICD-10-CM | POA: Diagnosis not present

## 2023-01-29 DIAGNOSIS — I129 Hypertensive chronic kidney disease with stage 1 through stage 4 chronic kidney disease, or unspecified chronic kidney disease: Secondary | ICD-10-CM | POA: Diagnosis not present

## 2023-01-29 DIAGNOSIS — N2581 Secondary hyperparathyroidism of renal origin: Secondary | ICD-10-CM | POA: Diagnosis not present

## 2023-01-29 DIAGNOSIS — N186 End stage renal disease: Secondary | ICD-10-CM | POA: Diagnosis not present

## 2023-01-29 DIAGNOSIS — E7800 Pure hypercholesterolemia, unspecified: Secondary | ICD-10-CM | POA: Diagnosis not present

## 2023-01-30 ENCOUNTER — Ambulatory Visit
Admission: RE | Admit: 2023-01-30 | Discharge: 2023-01-30 | Disposition: A | Discharge status: Home / Self Care | Payer: 59 | Source: Ambulatory Visit | Attending: Nurse Practitioner | Admitting: Nurse Practitioner

## 2023-01-30 DIAGNOSIS — Z1231 Encounter for screening mammogram for malignant neoplasm of breast: Secondary | ICD-10-CM | POA: Diagnosis not present

## 2023-01-31 DIAGNOSIS — D689 Coagulation defect, unspecified: Secondary | ICD-10-CM | POA: Diagnosis not present

## 2023-01-31 DIAGNOSIS — Z992 Dependence on renal dialysis: Secondary | ICD-10-CM | POA: Diagnosis not present

## 2023-01-31 DIAGNOSIS — E875 Hyperkalemia: Secondary | ICD-10-CM | POA: Diagnosis not present

## 2023-01-31 DIAGNOSIS — N2581 Secondary hyperparathyroidism of renal origin: Secondary | ICD-10-CM | POA: Diagnosis not present

## 2023-01-31 DIAGNOSIS — N186 End stage renal disease: Secondary | ICD-10-CM | POA: Diagnosis not present

## 2023-02-03 DIAGNOSIS — N2581 Secondary hyperparathyroidism of renal origin: Secondary | ICD-10-CM | POA: Diagnosis not present

## 2023-02-03 DIAGNOSIS — E875 Hyperkalemia: Secondary | ICD-10-CM | POA: Diagnosis not present

## 2023-02-03 DIAGNOSIS — Z992 Dependence on renal dialysis: Secondary | ICD-10-CM | POA: Diagnosis not present

## 2023-02-03 DIAGNOSIS — D689 Coagulation defect, unspecified: Secondary | ICD-10-CM | POA: Diagnosis not present

## 2023-02-03 DIAGNOSIS — N186 End stage renal disease: Secondary | ICD-10-CM | POA: Diagnosis not present

## 2023-02-05 DIAGNOSIS — N186 End stage renal disease: Secondary | ICD-10-CM | POA: Diagnosis not present

## 2023-02-05 DIAGNOSIS — D689 Coagulation defect, unspecified: Secondary | ICD-10-CM | POA: Diagnosis not present

## 2023-02-05 DIAGNOSIS — N2581 Secondary hyperparathyroidism of renal origin: Secondary | ICD-10-CM | POA: Diagnosis not present

## 2023-02-05 DIAGNOSIS — Z992 Dependence on renal dialysis: Secondary | ICD-10-CM | POA: Diagnosis not present

## 2023-02-05 DIAGNOSIS — E875 Hyperkalemia: Secondary | ICD-10-CM | POA: Diagnosis not present

## 2023-02-06 ENCOUNTER — Encounter: Payer: Self-pay | Admitting: Pulmonary Disease

## 2023-02-06 ENCOUNTER — Ambulatory Visit (INDEPENDENT_AMBULATORY_CARE_PROVIDER_SITE_OTHER): Payer: 59 | Admitting: Pulmonary Disease

## 2023-02-06 VITALS — BP 130/72 | HR 109 | Temp 98.6°F | Ht 62.0 in | Wt 202.8 lb

## 2023-02-06 DIAGNOSIS — R0609 Other forms of dyspnea: Secondary | ICD-10-CM | POA: Diagnosis not present

## 2023-02-06 NOTE — Patient Instructions (Addendum)
Nice to meet you  No changes to medication  I agree with trying Advair 2 puffs twice a day every day and albuterol as needed  Rinse her mouth out with water after every use of the Advair  Will get pulmonary function test to see if there is any other reason you could be short of breath, hopefully the same time of next follow-up visit with me  Given your description of worsening shortness of breath with food intake, I think most likely that fluid buildup between dialysis sessions is the biggest contributor to your symptoms but we will see if there are other things we can do to help  Return to clinic in 2 months with same-day PFT with Dr. Silas Flood

## 2023-02-07 DIAGNOSIS — Z992 Dependence on renal dialysis: Secondary | ICD-10-CM | POA: Diagnosis not present

## 2023-02-07 DIAGNOSIS — E875 Hyperkalemia: Secondary | ICD-10-CM | POA: Diagnosis not present

## 2023-02-07 DIAGNOSIS — N2581 Secondary hyperparathyroidism of renal origin: Secondary | ICD-10-CM | POA: Diagnosis not present

## 2023-02-07 DIAGNOSIS — D689 Coagulation defect, unspecified: Secondary | ICD-10-CM | POA: Diagnosis not present

## 2023-02-07 DIAGNOSIS — N186 End stage renal disease: Secondary | ICD-10-CM | POA: Diagnosis not present

## 2023-02-10 DIAGNOSIS — D689 Coagulation defect, unspecified: Secondary | ICD-10-CM | POA: Diagnosis not present

## 2023-02-10 DIAGNOSIS — N186 End stage renal disease: Secondary | ICD-10-CM | POA: Diagnosis not present

## 2023-02-10 DIAGNOSIS — N2581 Secondary hyperparathyroidism of renal origin: Secondary | ICD-10-CM | POA: Diagnosis not present

## 2023-02-10 DIAGNOSIS — Z992 Dependence on renal dialysis: Secondary | ICD-10-CM | POA: Diagnosis not present

## 2023-02-10 DIAGNOSIS — E875 Hyperkalemia: Secondary | ICD-10-CM | POA: Diagnosis not present

## 2023-02-11 NOTE — Progress Notes (Signed)
$@Patienth$  ID: Whitney Walker, female    DOB: 13-Dec-1956, 67 y.o.   MRN: [id number]  Chief Complaint  Patient presents with   Consult    Consult for copd. Pt states she has had COPD since 2005. She tells me she just started Advair daily and Albuterol as needed so she is unsure if the inhalers are really helping at this point. CT scan done 08/08/22 and echocardiogram 05/18/22.     Referring provider: [name], FNP  HPI:   67 y.o. woman whom we are seeing for evaluation of DOE. Most recent PCP note reviewed. Note from referring provider reviewed. Most recent pulmonary note Whitney Walker 02/2017 reviewed.  Patient reports DOE for some time. Many years. Worse on inclines or stairs. Has tried inhalers such as Breo in the past - she does not think it helps much. Recently prescribed advair. No time of day when symptoms are better or worse. No seasonal or environmental factors that make things better or worse. No other alleviating or exacerbating factors. She notes DOE is better after HD and worsened the days between HD sessions. Notes severity of DOE worse when she drinks more water.  Most recent CT 07/2022 reveals mild emphysema and bronchial wall thickening on my review and interpretation. She has about 25 pack year smoking history.  She had spirometry in 2018 that is not consistent with COPD, but suggestive of air trapping vs restriction on my review and interpretation.  Questionaires / Pulmonary Flowsheets:   ACT:      No data to display          MMRC:     No data to display          Epworth:      No data to display          Tests:   FENO:  No results found for: "NITRICOXIDE"  PFT:     No data to display          WALK:     03/12/2017    3:33 PM  SIX MIN WALK  Tech Comments: fast walk, no complications/TA/CMA    Imaging: Personally reviewed and as per EMR MM 3D SCREEN BREAST BILATERAL  Result Date: 01/31/2023 CLINICAL DATA:  Screening. EXAM: DIGITAL  SCREENING BILATERAL MAMMOGRAM WITH TOMOSYNTHESIS AND CAD TECHNIQUE: Bilateral screening digital craniocaudal and mediolateral oblique mammograms were obtained. Bilateral screening digital breast tomosynthesis was performed. The images were evaluated with computer-aided detection. COMPARISON:  Previous exam(s). ACR Breast Density Category b: There are scattered areas of fibroglandular density. FINDINGS: There are no findings suspicious for malignancy. IMPRESSION: No mammographic evidence of malignancy. A result letter of this screening mammogram will be mailed directly to the patient. RECOMMENDATION: Screening mammogram in one year. (Code:SM-B-01Y) BI-RADS CATEGORY  1: Negative. Electronically Signed   By: Whitney Walker M.D.   On: 01/31/2023 15:09    Lab Results: Personally reviewed CBC    Component Value Date/Time   WBC 12.3 (H) 05/30/2022 1604   WBC 20.6 (H) 05/22/2022 0204   RBC 3.40 (L) 05/30/2022 1604   RBC 3.19 (L) 05/22/2022 0204   HGB 13.9 12/10/2022 1200   HGB 10.3 (L) 05/30/2022 1604   HGB 10.9 (L) 07/07/2017 1235   HCT 41.0 12/10/2022 1200   HCT 31.8 (L) 05/30/2022 1604   HCT 35.0 07/07/2017 1235   PLT 285 05/30/2022 1604   MCV 94 05/30/2022 1604   MCV 85.2 07/07/2017 1235   MCH 30.3 05/30/2022 1604   MCH  30.1 05/22/2022 0204   MCHC 32.4 05/30/2022 1604   MCHC 33.4 05/22/2022 0204   RDW 13.4 05/30/2022 1604   RDW 15.8 (H) 07/07/2017 1235   LYMPHSABS 1.0 05/22/2022 0204   LYMPHSABS 2.4 07/07/2017 1235   MONOABS 0.5 05/22/2022 0204   MONOABS 0.8 07/07/2017 1235   EOSABS 0.0 05/22/2022 0204   EOSABS 0.2 07/07/2017 1235   BASOSABS 0.0 05/22/2022 0204   BASOSABS 0.0 07/07/2017 1235    BMET    Component Value Date/Time   NA 136 12/10/2022 1200   NA 139 05/30/2022 1604   NA 138 07/07/2017 1235   K 4.5 12/10/2022 1200   K 4.5 07/07/2017 1235   CL 96 (L) 12/10/2022 1200   CL 111 (H) 12/04/2012 1056   CO2 21 05/30/2022 1604   CO2 17 (L) 07/07/2017 1235   GLUCOSE 95  12/10/2022 1200   GLUCOSE 85 07/07/2017 1235   GLUCOSE 97 12/04/2012 1056   BUN 32 (H) 12/10/2022 1200   BUN 30 (H) 05/30/2022 1604   BUN 40.8 (H) 07/07/2017 1235   CREATININE 9.10 (H) 12/10/2022 1200   CREATININE 3.4 (HH) 07/07/2017 1235   CALCIUM 9.3 05/30/2022 1604   CALCIUM 9.5 07/07/2017 1235   GFRNONAA 4 (L) 05/20/2022 0131   GFRAA 7 (L) 05/18/2020 1036    BNP    Component Value Date/Time   BNP 406.5 (H) 10/30/2020 0407    ProBNP    Component Value Date/Time   PROBNP 435.7 (H) 03/09/2014 1421    Specialty Problems       Pulmonary Problems   Dyspnea on exertion    03/12/2017  Walked RA x 3 laps @ 185 ft each stopped due to  End of study, fast pace, sob at end and sats 90%  - Spirometry 03/12/2017  FEV1 1.34 (55%)  Ratio 79 with min curvature on BREO 100  - 03/12/2017  After extensive coaching device effectiveness =   90% baseline 75%         OSA (obstructive sleep apnea)    Allergies  Allergen Reactions   Carnosine     Other reaction(s): Unknown   Gadolinium Derivatives Hives and Other (See Comments)    HIVES, Desc: HIVES W/ "DYE" USED FOR 1ST CT SCAN BUT NOT 2ND, NO PREMEDS USED, PT UNCERTAIN OF CIRCUMSTANCES,,?POSSIBLE MRI CONTRAST ALLERGY, ALL STUDIES DONE "SOMEWHERE" IN [place]//A.C., Onset Date: [id number]   Iohexol Hives and Other (See Comments)    Desc: HIVES W/ "DYE" USED FOR 1ST CT SCAN BUT NOT 2ND, NO PREMEDS USED, PT UNCERTAIN OF CIRCUMSTANCES,,?POSSIBLE MRI CONTRAST ALLERGY, ALL STUDIES DONE "SOMEWHERE" IN [place]//A.C., Onset Date: [id number]    Iodinated Contrast Media Hives    1974   Iodine Hives    1974   Naltrexone Other (See Comments)    Unknown 1974     Immunization History  Administered Date(s) Administered   Fluad Quad(high Dose 65+) 10/04/2019   Hepatitis B, adult 09/28/2018, 05/19/2020   Influenza, High Dose Seasonal PF 12/29/2020, 10/10/2021   Influenza,inj,Quad PF,6+ Mos 10/04/2019, 10/04/2019   Influenza,inj,quad, With  Preservative 09/23/2018   Moderna Sars-Covid-2 Vaccination 03/10/2020, 04/07/2020, 12/22/2020   Pneumococcal Conjugate-13 04/20/2021   Pneumococcal Polysaccharide-23 03/22/2019    Past Medical History:  Diagnosis Date   Anemia    Anxiety    CHF (congestive heart failure) ([place])    Colon cancer ([place])    treatment surgery   Complication of anesthesia    after first C- [place] "couldnt walk after", patient denies having a  spinal   COPD (chronic obstructive pulmonary disease) ([facility])    Coronary artery disease    Depression    DVT (deep venous thrombosis) ([facility])    ESRD on hemodialysis ([place])    Hemo: MWF   History of blood transfusion 04/2018   Hypertension    07/07/18- no longer takes BP medications   Meningitis    Pain in limb 07/30/2013   PE (pulmonary embolism)    Peripheral vascular disease ([facility])    Restless legs    Shortness of breath    with exertion   Sleep apnea    SOB (shortness of breath) 03/03/2019   Vertigo     Tobacco History: Social History   Tobacco Use  Smoking Status Former   Packs/day: 0.50   Years: 50.00   Total pack years: 25.00   Types: Cigarettes   Quit date: 10/13/2022   Years since quitting: 0.3  Smokeless Tobacco Former  Tobacco Comments   She is using a vape when she runs out of her cigarettes, 7/27 - down to 1-2 cigs a day   Counseling given: Not Answered Tobacco comments: She is using a vape when she runs out of her cigarettes, 7/27 - down to 1-2 cigs a day   Continue to not smoke  Outpatient Encounter Medications as of 02/06/2023  Medication Sig   acetaminophen (TYLENOL) 325 MG tablet Take 650 mg by mouth daily as needed for headache.   acetaminophen (TYLENOL) 650 MG CR tablet Take 650 mg by mouth 3 (three) times daily as needed (arthritis).   albuterol (VENTOLIN HFA) 108 (90 Base) MCG/ACT inhaler Inhale 2 puffs into the lungs every 6 (six) hours as needed for wheezing or shortness of breath.   apixaban (ELIQUIS) 5 MG TABS tablet Take 1  tablet (5 mg total) by mouth 2 (two) times daily.   Ascorbic Acid (VITAMIN C) 100 MG tablet Take 100 mg by mouth daily.   atorvastatin (LIPITOR) 40 MG tablet Take 1 tablet (40 mg total) by mouth daily.   AURYXIA 1 GM 210 MG(Fe) tablet Take 420-840 mg by mouth See admin instructions. Take 841m by mouth three times daily with meals and 4233mwith snacks. Once supply is used up, patient will switch to Sevelamer   BIOTIN SL Place 10,000 mLs under the tongue daily.   cromolyn (OPTICROM) 4 % ophthalmic solution Place 1 drop into both eyes 4 (four) times daily.   diclofenac Sodium (VOLTAREN) 1 % GEL Apply 2 g topically daily as needed (pain).   Doxercalciferol (HECTOROL IV)    fludrocortisone (FLORINEF) 0.1 MG tablet Take 1 tablet (0.1 mg total) by mouth every other day. Take in evening prior to dialysis days   fluticasone (FLONASE) 50 MCG/ACT nasal spray Place 2 sprays into both nostrils daily. (Patient taking differently: Place 2 sprays into both nostrils daily as needed for allergies.)   fluticasone-salmeterol (ADVAIR HFA) 115-21 MCG/ACT inhaler Inhale 2 puffs into the lungs 2 (two) times daily.   meclizine (ANTIVERT) 12.5 MG tablet Take 1 tablet (12.5 mg total) by mouth 3 (three) times daily as needed for dizziness.   midodrine (PROAMATINE) 10 MG tablet Take 1 tablet (10 mg total) by mouth every other day. Take on the days of dialysis   multivitamin (RENA-VIT) TABS tablet Take 1 tablet by mouth daily. Patient is taking 1 tablet every night.   omeprazole (PRILOSEC) 20 MG capsule Take 1 capsule (20 mg total) by mouth daily.   sevelamer (RENAGEL) 800 MG tablet Take 800 mg  by mouth 3 (three) times daily with meals. Patient will start Sevelamer once Auryxia supply is used up.   vitamin E 200 UNIT capsule Take 200 Units by mouth daily.   No facility-administered encounter medications on file as of 02/06/2023.     Review of Systems  Review of Systems  No chest pain with exertion, no orthopnea or PND,  comprehensive ROS otherwise negative.   Physical Exam  BP 130/72 (BP Location: Left Arm, Patient Position: Sitting, Cuff Size: Normal)   Pulse (!) 109   Temp 98.6 F (37 C) (Oral)   Ht 5' 2"$  (1.575 m)   Wt 202 lb 12.8 oz (92 kg)   SpO2 99%   BMI 37.09 kg/m   Wt Readings from Last 5 Encounters:  02/06/23 202 lb 12.8 oz (92 kg)  01/16/23 204 lb 9.6 oz (92.8 kg)  01/02/23 204 lb (92.5 kg)  12/10/22 204 lb (92.5 kg)  10/17/22 206 lb 12.8 oz (93.8 kg)    BMI Readings from Last 5 Encounters:  02/06/23 37.09 kg/m  01/16/23 37.42 kg/m  01/02/23 37.31 kg/m  12/10/22 37.31 kg/m  10/17/22 37.82 kg/m     Physical Exam General: Sitting in chair, in NAD Eyes: EOMI, no icterus Neck: supple, no JVP appreciated Pulmonary: clear, NWOB on RA CV: RRR, warm Abdomen, ND, BS present MSK: No synovitis, no joint effusion Neuro: Normal gait, no weakness Psych: normal mood, full affect   Assessment & Plan:   DOE: Primarily related to fluid accumulation between HD sessions as she reports improvement after HD and gradual worsening the next day between HD sessions as well as worsening when she is less judicious with fluid restriction. Prior spirometry not consistent with COPD. Inhalers have not helped in the past. No changes to medications, agree with continuing Advair and PRN albuterol given bronchial wall thickening on CT 07/2022. PFT for further evaluation.    Return in about 2 months (around 04/07/2023).   Lanier Clam, MD 02/11/2023   This appointment required 60 minutes of patient care (this includes precharting, chart review, review of results, face-to-face care, etc.).

## 2023-02-12 DIAGNOSIS — D689 Coagulation defect, unspecified: Secondary | ICD-10-CM | POA: Diagnosis not present

## 2023-02-12 DIAGNOSIS — Z992 Dependence on renal dialysis: Secondary | ICD-10-CM | POA: Diagnosis not present

## 2023-02-12 DIAGNOSIS — N2581 Secondary hyperparathyroidism of renal origin: Secondary | ICD-10-CM | POA: Diagnosis not present

## 2023-02-12 DIAGNOSIS — N186 End stage renal disease: Secondary | ICD-10-CM | POA: Diagnosis not present

## 2023-02-12 DIAGNOSIS — E875 Hyperkalemia: Secondary | ICD-10-CM | POA: Diagnosis not present

## 2023-02-14 DIAGNOSIS — N2581 Secondary hyperparathyroidism of renal origin: Secondary | ICD-10-CM | POA: Diagnosis not present

## 2023-02-14 DIAGNOSIS — Z992 Dependence on renal dialysis: Secondary | ICD-10-CM | POA: Diagnosis not present

## 2023-02-14 DIAGNOSIS — E875 Hyperkalemia: Secondary | ICD-10-CM | POA: Diagnosis not present

## 2023-02-14 DIAGNOSIS — D689 Coagulation defect, unspecified: Secondary | ICD-10-CM | POA: Diagnosis not present

## 2023-02-14 DIAGNOSIS — N186 End stage renal disease: Secondary | ICD-10-CM | POA: Diagnosis not present

## 2023-02-16 ENCOUNTER — Other Ambulatory Visit: Payer: Self-pay | Admitting: Nurse Practitioner

## 2023-02-17 DIAGNOSIS — D689 Coagulation defect, unspecified: Secondary | ICD-10-CM | POA: Diagnosis not present

## 2023-02-17 DIAGNOSIS — N186 End stage renal disease: Secondary | ICD-10-CM | POA: Diagnosis not present

## 2023-02-17 DIAGNOSIS — E875 Hyperkalemia: Secondary | ICD-10-CM | POA: Diagnosis not present

## 2023-02-17 DIAGNOSIS — N2581 Secondary hyperparathyroidism of renal origin: Secondary | ICD-10-CM | POA: Diagnosis not present

## 2023-02-17 DIAGNOSIS — Z992 Dependence on renal dialysis: Secondary | ICD-10-CM | POA: Diagnosis not present

## 2023-02-18 ENCOUNTER — Ambulatory Visit: Payer: Medicaid Other | Admitting: Cardiology

## 2023-02-18 ENCOUNTER — Encounter: Payer: Self-pay | Admitting: Cardiology

## 2023-02-18 ENCOUNTER — Telehealth: Payer: Self-pay

## 2023-02-18 VITALS — BP 130/80 | HR 99 | Resp 14 | Ht 62.0 in | Wt 204.6 lb

## 2023-02-18 DIAGNOSIS — R0609 Other forms of dyspnea: Secondary | ICD-10-CM | POA: Diagnosis not present

## 2023-02-18 DIAGNOSIS — I951 Orthostatic hypotension: Secondary | ICD-10-CM

## 2023-02-18 DIAGNOSIS — N186 End stage renal disease: Secondary | ICD-10-CM | POA: Diagnosis not present

## 2023-02-18 DIAGNOSIS — I5032 Chronic diastolic (congestive) heart failure: Secondary | ICD-10-CM

## 2023-02-18 DIAGNOSIS — Z992 Dependence on renal dialysis: Secondary | ICD-10-CM | POA: Diagnosis not present

## 2023-02-18 MED ORDER — FLUDROCORTISONE ACETATE 0.1 MG PO TABS: 0.1000 mg | ORAL_TABLET | ORAL | 1 refills | Status: DC

## 2023-02-18 NOTE — Progress Notes (Cosign Needed)
Care Management & Coordination Services Pharmacy Team  Reason for Encounter: Medication coordination and delivery  Contacted patient to discuss medications and coordinate delivery from [facility]. Unsuccessful outreach. Left voicemail for patient to return call.  **Patient called [facility] and notified them she was not in need of any medications. Scheduled a f/u call with patient next month.  Recent blood pressure readings are as follows:130/80 on 02/18/2023  Recent blood glucose readings are as follows:95 on 12/10/2022 labs   Chart review: Recent office visits:  01/21/2023- [name], RN (CCM)  Recent consult visits:  2/8/2024Rodman Key Hunsucker. MD (Pulmonology) -No medication changes  02/18/2023- [name], MD (Cardiology)- Cromolyn Sodium 4% and Sevelamer HCL 800 mg discontinued.  Hospital visits:  None in previous 6 months  Medications: Outpatient Encounter Medications as of 02/18/2023  Medication Sig Note   acetaminophen (TYLENOL) 325 MG tablet Take 650 mg by mouth daily as needed for headache.    acetaminophen (TYLENOL) 650 MG CR tablet Take 650 mg by mouth 3 (three) times daily as needed (arthritis).    albuterol (VENTOLIN HFA) 108 (90 Base) MCG/ACT inhaler Inhale 2 puffs into the lungs every 6 (six) hours as needed for wheezing or shortness of breath.    Ascorbic Acid (VITAMIN C) 100 MG tablet Take 100 mg by mouth daily.    atorvastatin (LIPITOR) 40 MG tablet Take 1 tablet (40 mg total) by mouth daily.    AURYXIA 1 GM 210 MG(Fe) tablet Take 420-840 mg by mouth See admin instructions. Take 840mg  by mouth three times daily with meals and 420mg  with snacks. Once supply is used up, patient will switch to Sevelamer    BIOTIN SL Place 10,000 mLs under the tongue daily.    diclofenac Sodium (VOLTAREN) 1 % GEL Apply 2 g topically daily as needed (pain).    Doxercalciferol (HECTOROL IV)  12/06/2022: Given at dialysis   ELIQUIS 5 MG TABS tablet TAKE ONE TABLET BY  MOUTH TWICE DAILY    fludrocortisone (FLORINEF) 0.1 MG tablet Take 1 tablet (0.1 mg total) by mouth every other day. Take in evening prior to dialysis days    fluticasone (FLONASE) 50 MCG/ACT nasal spray Place 2 sprays into both nostrils daily. (Patient taking differently: Place 2 sprays into both nostrils daily as needed for allergies.)    fluticasone-salmeterol (ADVAIR HFA) 115-21 MCG/ACT inhaler Inhale 2 puffs into the lungs 2 (two) times daily.    meclizine (ANTIVERT) 12.5 MG tablet Take 1 tablet (12.5 mg total) by mouth 3 (three) times daily as needed for dizziness.    midodrine (PROAMATINE) 10 MG tablet Take 1 tablet (10 mg total) by mouth every other day. Take on the days of dialysis    multivitamin (RENA-VIT) TABS tablet Take 1 tablet by mouth daily. Patient is taking 1 tablet every night.    omeprazole (PRILOSEC) 20 MG capsule Take 1 capsule (20 mg total) by mouth daily.    vitamin E 200 UNIT capsule Take 200 Units by mouth daily.    No facility-administered encounter medications on file as of 02/18/2023.   BP Readings from Last 3 Encounters:  02/18/23 130/80  02/06/23 130/72  01/16/23 109/69    Pulse Readings from Last 3 Encounters:  02/18/23 99  02/06/23 (!) 109  01/16/23 80    Lab Results  Component Value Date/Time   HGBA1C 5.9 (H) 11/15/2021 11:45 AM   HGBA1C 6.2 (H) 07/16/2021 02:02 PM   Lab Results  Component Value Date   CREATININE 9.10 (H) 12/10/2022  BUN 32 (H) 12/10/2022   GFRNONAA 4 (L) 05/20/2022   GFRAA 7 (L) 05/18/2020   NA 136 12/10/2022   K 4.5 12/10/2022   CALCIUM 9.3 05/30/2022   CO2 21 05/30/2022     [name], [place] Clinical Pharmacist Assistant [phone]

## 2023-02-18 NOTE — Progress Notes (Signed)
Primary Physician/Referring:  [name], FNP  Patient ID: Whitney Walker, female    DOB: 05-Jul-1956, 67 y.o.   MRN: [id number]  No chief complaint on file.  HPI:    Whitney Walker  is a 67 y.o. African-American female with history of obesity, hypertension, hyperlipidemia, tobacco use, obstructive sleep apnea not on CPAP, COPD and severe peripheral arterial disease with history of left femoral-popliteal bypass surgery in 2014, on chronic anticoagulation with Eliquis due to recurrent DVT and PE in 2014 and 2016, ESRD on hemodialysis on  Monday/Wednesday/Friday.  Patient presents here for 19-monthoffice visit and follow-up of orthostatic hypotension.  Previously patient had hypertension, since development of end-stage renal disease she has developed hypotension especially orthostatic hypotension.   Previously was on midodrine, the medication has been restarted as her blood pressure drops very low during the days of dialysis however patient discontinued this as it was not effective.  Otherwise patient is doing well, denies any chest pain, denies symptoms of claudication.  Past Medical History:  Diagnosis Date   Anemia    Anxiety    CHF (congestive heart failure) ([facility]    Colon cancer ([facility]    treatment surgery   Complication of anesthesia    after first [facility] [place] "couldnt walk after", patient denies having a spinal   COPD (chronic obstructive pulmonary disease) ([facility]    Coronary artery disease    Depression    DVT (deep venous thrombosis) (HCC)    ESRD on hemodialysis ([facility]    Hemo: MWF   History of blood transfusion 04/2018   Hypertension    07/07/18- no longer takes BP medications   Meningitis    Pain in limb 07/30/2013   PE (pulmonary embolism)    Peripheral vascular disease ([facility]    Primary hypertension 11/15/2013   Restless legs    Shortness of breath    with exertion   Sleep apnea    SOB (shortness of breath) 03/03/2019   Vertigo      Social History    Tobacco Use   Smoking status: Former    Packs/day: 0.50    Years: 50.00    Total pack years: 25.00    Types: Cigarettes    Quit date: 10/13/2022    Years since quitting: 0.3   Smokeless tobacco: Former   Tobacco comments:    She is using a vape when she runs out of her cigarettes, 7/27 - down to 1-2 cigs a day  Substance Use Topics   Alcohol use: No    Alcohol/week: 0.0 standard drinks of alcohol    Comment: she used to drink alcohol, quit in 2010; h/o heavy use    Marital Status: Single   ROS  Review of Systems  Cardiovascular:  Negative for chest pain, dyspnea on exertion and leg swelling.  Neurological:  Positive for dizziness.   Objective  Blood pressure 130/80, pulse 99, resp. rate 14, height 5' 2"$  (1.575 m), weight 204 lb 9.6 oz (92.8 kg), SpO2 95 %.     02/18/2023   10:03 AM 02/06/2023    1:19 PM 01/16/2023   10:57 AM  Vitals with BMI  Height 5' 2"$  5' 2"$  5' 2"$   Weight 204 lbs 10 oz 202 lbs 13 oz 204 lbs 10 oz  BMI 37.41 [id number] Systolic [id number] Diastolic 80 72 69  Pulse 99 109 80    Orthostatic VS for the past 72 hrs (Last 3 readings):  Orthostatic BP Patient  Position BP Location Cuff Size Orthostatic Pulse  02/18/23 1011 103/61 Sitting Left Arm Large 104  02/18/23 1010 125/62 Sitting Left Arm Large 98  02/18/23 1009 132/69 Supine Left Arm Large 90    Physical Exam Neck:     Vascular: Carotid bruit (left) present. No JVD.  Cardiovascular:     Rate and Rhythm: Normal rate and regular rhythm.     Pulses:          Dorsalis pedis pulses are 0 on the right side and 0 on the left side.       Posterior tibial pulses are 0 on the right side and 0 on the left side.     Heart sounds: Normal heart sounds. No murmur heard.    No gallop.  Pulmonary:     Effort: Pulmonary effort is normal.     Breath sounds: Normal breath sounds.  Abdominal:     General: Bowel sounds are normal.     Palpations: Abdomen is soft.  Musculoskeletal:     Right lower leg: No  edema.     Left lower leg: No edema.    Laboratory examination:   Recent Labs    05/18/22 0433 05/19/22 2007 05/20/22 0131 05/30/22 1604 12/10/22 1200  NA 135 131* 134* 139 136  K 5.0 4.2 4.6 4.6 4.5  CL 102 100 103 101 96*  CO2 20* 18* 19* 21  --   GLUCOSE 100* 145* 122* 118* 95  BUN 24* 36* 36* 30* 32*  CREATININE 5.49* 9.05* 9.12* 7.91* 9.10*  CALCIUM 8.4* 8.5* 8.7* 9.3  --   GFRNONAA 8* 4* 4*  --   --    Lab Results  Component Value Date   ALT 14 05/17/2022   AST 25 05/17/2022   ALKPHOS 89 05/17/2022   BILITOT 0.8 05/17/2022       Latest Ref Rng & Units 12/10/2022   12:00 PM 05/30/2022    4:04 PM 05/22/2022    2:04 AM  CBC  WBC 3.4 - 10.8 x10E3/uL  12.3  20.6   Hemoglobin 12.0 - 15.0 g/dL 13.9  10.3  9.6   Hematocrit 36.0 - 46.0 % 41.0  31.8  28.7   Platelets 150 - 450 x10E3/uL  285  299    Lab Results  Component Value Date   CHOL 183 03/15/2021   HDL 55 03/15/2021   LDLCALC 100 (H) 03/15/2021   TRIG 159 (H) 03/15/2021   CHOLHDL 3.3 03/15/2021    HEMOGLOBIN A1C Lab Results  Component Value Date   HGBA1C 5.9 (H) 11/15/2021   MPG 131.24 07/16/2021   TSH Lab Results  Component Value Date   TSH 1.57 06/04/2018    Labs 01/13/2023:  Total cholesterol 138, triglycerides 152, HDL 43, LDL 65.  Allergies   Allergies  Allergen Reactions   Carnosine     Other reaction(s): Unknown   Gadolinium Derivatives Hives and Other (See Comments)    HIVES, Desc: HIVES W/ "DYE" USED FOR 1ST CT SCAN BUT NOT 2ND, NO PREMEDS USED, PT UNCERTAIN OF CIRCUMSTANCES,,?POSSIBLE MRI CONTRAST ALLERGY, ALL STUDIES DONE "SOMEWHERE" IN [place]//A.C., Onset Date: [id number]   Iohexol Hives and Other (See Comments)    Desc: HIVES W/ "DYE" USED FOR 1ST CT SCAN BUT NOT 2ND, NO PREMEDS USED, PT UNCERTAIN OF CIRCUMSTANCES,,?POSSIBLE MRI CONTRAST ALLERGY, ALL STUDIES DONE "SOMEWHERE" IN [place]//A.C., Onset Date: [id number]    Iodinated Contrast Media Hives    1974   Iodine Hives     1974  Naltrexone Other (See Comments)    Unknown 1974     Final Medications at End of Visit    Current Outpatient Medications:    acetaminophen (TYLENOL) 325 MG tablet, Take 650 mg by mouth daily as needed for headache., Disp: , Rfl:    acetaminophen (TYLENOL) 650 MG CR tablet, Take 650 mg by mouth 3 (three) times daily as needed (arthritis)., Disp: , Rfl:    albuterol (VENTOLIN HFA) 108 (90 Base) MCG/ACT inhaler, Inhale 2 puffs into the lungs every 6 (six) hours as needed for wheezing or shortness of breath., Disp: 18 g, Rfl: 2   atorvastatin (LIPITOR) 40 MG tablet, Take 1 tablet (40 mg total) by mouth daily., Disp: 90 tablet, Rfl: 3   AURYXIA 1 GM 210 MG(Fe) tablet, Take 420-840 mg by mouth See admin instructions. Take 815m by mouth three times daily with meals and 4227mwith snacks. Once supply is used up, patient will switch to Sevelamer, Disp: , Rfl:    BIOTIN SL, Place 10,000 mLs under the tongue daily., Disp: , Rfl:    diclofenac Sodium (VOLTAREN) 1 % GEL, Apply 2 g topically daily as needed (pain)., Disp: , Rfl:    Doxercalciferol (HECTOROL IV), , Disp: , Rfl:    ELIQUIS 5 MG TABS tablet, TAKE ONE TABLET BY MOUTH TWICE DAILY, Disp: 60 tablet, Rfl: 2   fluticasone (FLONASE) 50 MCG/ACT nasal spray, Place 2 sprays into both nostrils daily. (Patient taking differently: Place 2 sprays into both nostrils daily as needed for allergies.), Disp: 18.2 mL, Rfl: 2   fluticasone-salmeterol (ADVAIR HFA) 115-21 MCG/ACT inhaler, Inhale 2 puffs into the lungs 2 (two) times daily., Disp: 1 each, Rfl: 12   meclizine (ANTIVERT) 12.5 MG tablet, Take 1 tablet (12.5 mg total) by mouth 3 (three) times daily as needed for dizziness., Disp: 30 tablet, Rfl: 0   midodrine (PROAMATINE) 10 MG tablet, Take 1 tablet (10 mg total) by mouth every other day. Take on the days of dialysis, Disp: 30 tablet, Rfl: 2   multivitamin (RENA-VIT) TABS tablet, Take 1 tablet by mouth daily. Patient is taking 1 tablet every  night., Disp: , Rfl:    omeprazole (PRILOSEC) 20 MG capsule, Take 1 capsule (20 mg total) by mouth daily., Disp: 90 capsule, Rfl: 1   vitamin E 200 UNIT capsule, Take 200 Units by mouth daily., Disp: , Rfl:    Ascorbic Acid (VITAMIN C) 100 MG tablet, Take 100 mg by mouth daily., Disp: , Rfl:    fludrocortisone (FLORINEF) 0.1 MG tablet, Take 1 tablet (0.1 mg total) by mouth every other day. Take in evening prior to dialysis days, Disp: 90 tablet, Rfl: 1   Radiology:   No results found.  Cardiac Studies:  Left Heart Catheterization 05/02/22:  RCA: Dominant.  Mild diffuse disease. Left main: Large vessel, very short and immediately bifurcates.  Calcified. LCx: Large vessel, [place] to a very high large OM1, a moderate-sized OM 2 distally, mild to moderate diffuse disease is present.  No high-grade stenosis. LAD: Large vessel, gives coronary to a very large D1, mild diffuse disease again evident without any focal high-grade stenosis.  Diffuse coronary calcification is again evident.  LAD is tortuous in the mid to distal segment suggestive of hypertensive heart disease.  Recommendation: [name] positive cardiac markers probably related to very small vessel disease.  Do not see any obvious dissection in the major coronary vessels or thrombus formation or occlusion.  No significant change in coronary anatomy since prior angiography a  year ago in 2022.  Recommend continuing medical therapy.  30 mL contrast utilized.  Echocardiogram 05/18/2022:   1. Left ventricular ejection fraction, by estimation, is 70 to 75%. The left ventricle has hyperdynamic function. The left ventricle has no regional wall motion abnormalities. There is moderate left ventricular hypertrophy. Left ventricular diastolic  parameters are consistent with Grade I diastolic dysfunction (impaired relaxation).  2. Right ventricular systolic function is normal. The right ventricular size is normal.  3. No vegetation seen. The mitral  valve is abnormal. No evidence of mitral valve regurgitation. No evidence of mitral stenosis. Moderate mitral annular calcification. No vegetation seen.  4. Increased transvalvular gradient (max 42 mmHg) likely a combination of LVOT obstruction and possibly mild aortic valvular stenosis.     No vegetation seen.. The aortic valve is tricuspid. There is mild calcification of the aortic valve. Aortic valve regurgitation is not visualized. No vegetation seen  5. No signifcaitn change compared to outpatient study in 03/2022.  [facility] MYOCARDIAL PERFUSION WO LEXISCAN 11/20/2020 Lexiscan not administered at patient's request. Incomplete study. EKG tracing not available for review. Rest images show mild decrease in tracer update in basal inferior myocardium. Consider alternate ischemia studies, if clinically indicated.  Carotid artery duplex 07/09/2022: Duplex suggests stenosis in the right internal carotid artery (1-15%). Duplex suggests stenosis in the left internal carotid artery (16-49%). Antegrade right vertebral artery flow. Antegrade left vertebral artery flow. Compared to the study done on 05/08/2021, imaging was difficult and right ICA velocities could not be established and felt to be totally occluded.  However right ICA is widely patent.  Minimal stenosis with mild heterogenous plaque.  No change in the left carotid artery stenosis. Follow up in one year is appropriate if clinically indicated.  EKG:   EKG 02/18/2023: Normal sinus rhythm at rate of 97 bpm, left atrial enlargement, leftward axis.  Incomplete right bundle branch block.  Poor R progression, cannot exclude anteroseptal infarct old.  Compared to 10/17/2022, no change, previous heart rate was 102 bpm.  Assessment     ICD-10-CM   1. Orthostatic hypotension  I95.1 EKG 12-Lead    fludrocortisone (FLORINEF) 0.1 MG tablet    2. Dyspnea on exertion  R06.09     3. Chronic diastolic heart failure (HCC)  I50.32     4. ESRD on dialysis  ([place])  N18.6    Z99.2      Medications Discontinued During This Encounter  Medication Reason   cromolyn (OPTICROM) 4 % ophthalmic solution    sevelamer (RENAGEL) 800 MG tablet    fludrocortisone (FLORINEF) 0.1 MG tablet Reorder    Meds ordered this encounter  Medications   fludrocortisone (FLORINEF) 0.1 MG tablet    Sig: Take 1 tablet (0.1 mg total) by mouth every other day. Take in evening prior to dialysis days    Dispense:  90 tablet    Refill:  1   Recommendations:   Whitney Walker is a 67 y.o. African-American female with history of obesity, hypertension, hyperlipidemia, tobacco use, obstructive sleep apnea not on CPAP, COPD and severe peripheral arterial disease with history of left femoral-popliteal bypass surgery in 2014, on chronic anticoagulation with Eliquis due to recurrent DVT and PE in 2014 and 2016, ESRD on hemodialysis on  Monday/Wednesday/Friday.  1. Orthostatic hypotension Patient with severe orthostatic hypotension occurring especially during dialysis and had to discontinue dialysis due to low blood pressure.  Fortunately she has not had any dizziness during nondialysis days.  Previously patient had a diagnosis  of primary hypertension however now this is resolved and now has severe orthostatic hypotension only.  She is on Florinef and also on midodrine.  I started her on Florinef 0.1 mg every other day on her last office visit 6 weeks ago.  As per nephrology, she was advised to hold midodrine to see how she would react.  Advised her that she could certainly try this but to have a low threshold to go back on midodrine if unable to dialyze.  2. Dyspnea on exertion Her dyspnea is related to obesity hypoventilation, underlying mild COPD, I reviewed the notes from pulmonary medicine, they suspect diastolic heart failure also to be the etiology as she does feel better postdialysis days.  I agree with this, patient has morbid obesity and also not sure about her  diet.  3. Chronic diastolic heart failure (HCC) Chronic diastolic heart failure has remained stable, no clinical evidence of acute decompensated failure, lungs are clear, no JVD and no leg edema.  She is not on guideline directed medical therapy in view of severe orthostatic hypotension.  4. ESRD on dialysis [facility]) She is presently on dialysis 3 times a week.   Office visit in 6 months with repeat carotid artery duplex.   [name], MD, [facility] 02/18/2023, 10:47 AM Office: [phone] Fax: [phone] Pager: [phone]

## 2023-02-19 DIAGNOSIS — N2581 Secondary hyperparathyroidism of renal origin: Secondary | ICD-10-CM | POA: Diagnosis not present

## 2023-02-19 DIAGNOSIS — N186 End stage renal disease: Secondary | ICD-10-CM | POA: Diagnosis not present

## 2023-02-19 DIAGNOSIS — D689 Coagulation defect, unspecified: Secondary | ICD-10-CM | POA: Diagnosis not present

## 2023-02-19 DIAGNOSIS — E875 Hyperkalemia: Secondary | ICD-10-CM | POA: Diagnosis not present

## 2023-02-19 DIAGNOSIS — Z992 Dependence on renal dialysis: Secondary | ICD-10-CM | POA: Diagnosis not present

## 2023-02-21 DIAGNOSIS — N2581 Secondary hyperparathyroidism of renal origin: Secondary | ICD-10-CM | POA: Diagnosis not present

## 2023-02-21 DIAGNOSIS — D689 Coagulation defect, unspecified: Secondary | ICD-10-CM | POA: Diagnosis not present

## 2023-02-21 DIAGNOSIS — E875 Hyperkalemia: Secondary | ICD-10-CM | POA: Diagnosis not present

## 2023-02-21 DIAGNOSIS — N186 End stage renal disease: Secondary | ICD-10-CM | POA: Diagnosis not present

## 2023-02-21 DIAGNOSIS — Z992 Dependence on renal dialysis: Secondary | ICD-10-CM | POA: Diagnosis not present

## 2023-02-24 DIAGNOSIS — N186 End stage renal disease: Secondary | ICD-10-CM | POA: Diagnosis not present

## 2023-02-24 DIAGNOSIS — D689 Coagulation defect, unspecified: Secondary | ICD-10-CM | POA: Diagnosis not present

## 2023-02-24 DIAGNOSIS — E875 Hyperkalemia: Secondary | ICD-10-CM | POA: Diagnosis not present

## 2023-02-24 DIAGNOSIS — Z992 Dependence on renal dialysis: Secondary | ICD-10-CM | POA: Diagnosis not present

## 2023-02-24 DIAGNOSIS — N2581 Secondary hyperparathyroidism of renal origin: Secondary | ICD-10-CM | POA: Diagnosis not present

## 2023-02-25 DIAGNOSIS — H04123 Dry eye syndrome of bilateral lacrimal glands: Secondary | ICD-10-CM | POA: Diagnosis not present

## 2023-02-25 DIAGNOSIS — Z961 Presence of intraocular lens: Secondary | ICD-10-CM | POA: Diagnosis not present

## 2023-02-25 DIAGNOSIS — H1045 Other chronic allergic conjunctivitis: Secondary | ICD-10-CM | POA: Diagnosis not present

## 2023-02-25 DIAGNOSIS — H43822 Vitreomacular adhesion, left eye: Secondary | ICD-10-CM | POA: Diagnosis not present

## 2023-02-26 DIAGNOSIS — Z992 Dependence on renal dialysis: Secondary | ICD-10-CM | POA: Diagnosis not present

## 2023-02-26 DIAGNOSIS — E875 Hyperkalemia: Secondary | ICD-10-CM | POA: Diagnosis not present

## 2023-02-26 DIAGNOSIS — N186 End stage renal disease: Secondary | ICD-10-CM | POA: Diagnosis not present

## 2023-02-26 DIAGNOSIS — N2581 Secondary hyperparathyroidism of renal origin: Secondary | ICD-10-CM | POA: Diagnosis not present

## 2023-02-26 DIAGNOSIS — D689 Coagulation defect, unspecified: Secondary | ICD-10-CM | POA: Diagnosis not present

## 2023-02-27 DIAGNOSIS — N186 End stage renal disease: Secondary | ICD-10-CM | POA: Diagnosis not present

## 2023-02-27 DIAGNOSIS — Z992 Dependence on renal dialysis: Secondary | ICD-10-CM | POA: Diagnosis not present

## 2023-02-27 DIAGNOSIS — I129 Hypertensive chronic kidney disease with stage 1 through stage 4 chronic kidney disease, or unspecified chronic kidney disease: Secondary | ICD-10-CM | POA: Diagnosis not present

## 2023-02-28 DIAGNOSIS — D689 Coagulation defect, unspecified: Secondary | ICD-10-CM | POA: Diagnosis not present

## 2023-02-28 DIAGNOSIS — T82594A Other mechanical complication of infusion catheter, initial encounter: Secondary | ICD-10-CM | POA: Diagnosis not present

## 2023-02-28 DIAGNOSIS — T8249XD Other complication of vascular dialysis catheter, subsequent encounter: Secondary | ICD-10-CM | POA: Diagnosis not present

## 2023-02-28 DIAGNOSIS — N186 End stage renal disease: Secondary | ICD-10-CM | POA: Diagnosis not present

## 2023-02-28 DIAGNOSIS — R17 Unspecified jaundice: Secondary | ICD-10-CM | POA: Diagnosis not present

## 2023-02-28 DIAGNOSIS — E875 Hyperkalemia: Secondary | ICD-10-CM | POA: Diagnosis not present

## 2023-02-28 DIAGNOSIS — Z992 Dependence on renal dialysis: Secondary | ICD-10-CM | POA: Diagnosis not present

## 2023-02-28 DIAGNOSIS — N2581 Secondary hyperparathyroidism of renal origin: Secondary | ICD-10-CM | POA: Diagnosis not present

## 2023-03-03 DIAGNOSIS — T82594A Other mechanical complication of infusion catheter, initial encounter: Secondary | ICD-10-CM | POA: Diagnosis not present

## 2023-03-03 DIAGNOSIS — D689 Coagulation defect, unspecified: Secondary | ICD-10-CM | POA: Diagnosis not present

## 2023-03-03 DIAGNOSIS — N2581 Secondary hyperparathyroidism of renal origin: Secondary | ICD-10-CM | POA: Diagnosis not present

## 2023-03-03 DIAGNOSIS — T8249XD Other complication of vascular dialysis catheter, subsequent encounter: Secondary | ICD-10-CM | POA: Diagnosis not present

## 2023-03-03 DIAGNOSIS — N186 End stage renal disease: Secondary | ICD-10-CM | POA: Diagnosis not present

## 2023-03-03 DIAGNOSIS — E875 Hyperkalemia: Secondary | ICD-10-CM | POA: Diagnosis not present

## 2023-03-03 DIAGNOSIS — R17 Unspecified jaundice: Secondary | ICD-10-CM | POA: Diagnosis not present

## 2023-03-03 DIAGNOSIS — Z992 Dependence on renal dialysis: Secondary | ICD-10-CM | POA: Diagnosis not present

## 2023-03-05 DIAGNOSIS — N186 End stage renal disease: Secondary | ICD-10-CM | POA: Diagnosis not present

## 2023-03-05 DIAGNOSIS — E875 Hyperkalemia: Secondary | ICD-10-CM | POA: Diagnosis not present

## 2023-03-05 DIAGNOSIS — T8249XD Other complication of vascular dialysis catheter, subsequent encounter: Secondary | ICD-10-CM | POA: Diagnosis not present

## 2023-03-05 DIAGNOSIS — T82594A Other mechanical complication of infusion catheter, initial encounter: Secondary | ICD-10-CM | POA: Diagnosis not present

## 2023-03-05 DIAGNOSIS — D689 Coagulation defect, unspecified: Secondary | ICD-10-CM | POA: Diagnosis not present

## 2023-03-05 DIAGNOSIS — N2581 Secondary hyperparathyroidism of renal origin: Secondary | ICD-10-CM | POA: Diagnosis not present

## 2023-03-05 DIAGNOSIS — R17 Unspecified jaundice: Secondary | ICD-10-CM | POA: Diagnosis not present

## 2023-03-05 DIAGNOSIS — Z992 Dependence on renal dialysis: Secondary | ICD-10-CM | POA: Diagnosis not present

## 2023-03-07 DIAGNOSIS — R17 Unspecified jaundice: Secondary | ICD-10-CM | POA: Diagnosis not present

## 2023-03-07 DIAGNOSIS — N186 End stage renal disease: Secondary | ICD-10-CM | POA: Diagnosis not present

## 2023-03-07 DIAGNOSIS — Z992 Dependence on renal dialysis: Secondary | ICD-10-CM | POA: Diagnosis not present

## 2023-03-07 DIAGNOSIS — T8249XD Other complication of vascular dialysis catheter, subsequent encounter: Secondary | ICD-10-CM | POA: Diagnosis not present

## 2023-03-07 DIAGNOSIS — E875 Hyperkalemia: Secondary | ICD-10-CM | POA: Diagnosis not present

## 2023-03-07 DIAGNOSIS — N2581 Secondary hyperparathyroidism of renal origin: Secondary | ICD-10-CM | POA: Diagnosis not present

## 2023-03-07 DIAGNOSIS — D689 Coagulation defect, unspecified: Secondary | ICD-10-CM | POA: Diagnosis not present

## 2023-03-07 DIAGNOSIS — T82594A Other mechanical complication of infusion catheter, initial encounter: Secondary | ICD-10-CM | POA: Diagnosis not present

## 2023-03-10 DIAGNOSIS — E875 Hyperkalemia: Secondary | ICD-10-CM | POA: Diagnosis not present

## 2023-03-10 DIAGNOSIS — R17 Unspecified jaundice: Secondary | ICD-10-CM | POA: Diagnosis not present

## 2023-03-10 DIAGNOSIS — D689 Coagulation defect, unspecified: Secondary | ICD-10-CM | POA: Diagnosis not present

## 2023-03-10 DIAGNOSIS — N186 End stage renal disease: Secondary | ICD-10-CM | POA: Diagnosis not present

## 2023-03-10 DIAGNOSIS — T8249XD Other complication of vascular dialysis catheter, subsequent encounter: Secondary | ICD-10-CM | POA: Diagnosis not present

## 2023-03-10 DIAGNOSIS — T82594A Other mechanical complication of infusion catheter, initial encounter: Secondary | ICD-10-CM | POA: Diagnosis not present

## 2023-03-10 DIAGNOSIS — Z992 Dependence on renal dialysis: Secondary | ICD-10-CM | POA: Diagnosis not present

## 2023-03-10 DIAGNOSIS — N2581 Secondary hyperparathyroidism of renal origin: Secondary | ICD-10-CM | POA: Diagnosis not present

## 2023-03-12 DIAGNOSIS — T82594A Other mechanical complication of infusion catheter, initial encounter: Secondary | ICD-10-CM | POA: Diagnosis not present

## 2023-03-12 DIAGNOSIS — E875 Hyperkalemia: Secondary | ICD-10-CM | POA: Diagnosis not present

## 2023-03-12 DIAGNOSIS — T8249XD Other complication of vascular dialysis catheter, subsequent encounter: Secondary | ICD-10-CM | POA: Diagnosis not present

## 2023-03-12 DIAGNOSIS — N2581 Secondary hyperparathyroidism of renal origin: Secondary | ICD-10-CM | POA: Diagnosis not present

## 2023-03-12 DIAGNOSIS — N186 End stage renal disease: Secondary | ICD-10-CM | POA: Diagnosis not present

## 2023-03-12 DIAGNOSIS — R17 Unspecified jaundice: Secondary | ICD-10-CM | POA: Diagnosis not present

## 2023-03-12 DIAGNOSIS — Z992 Dependence on renal dialysis: Secondary | ICD-10-CM | POA: Diagnosis not present

## 2023-03-12 DIAGNOSIS — D689 Coagulation defect, unspecified: Secondary | ICD-10-CM | POA: Diagnosis not present

## 2023-03-14 DIAGNOSIS — T82594A Other mechanical complication of infusion catheter, initial encounter: Secondary | ICD-10-CM | POA: Diagnosis not present

## 2023-03-14 DIAGNOSIS — T8249XD Other complication of vascular dialysis catheter, subsequent encounter: Secondary | ICD-10-CM | POA: Diagnosis not present

## 2023-03-14 DIAGNOSIS — R17 Unspecified jaundice: Secondary | ICD-10-CM | POA: Diagnosis not present

## 2023-03-14 DIAGNOSIS — D689 Coagulation defect, unspecified: Secondary | ICD-10-CM | POA: Diagnosis not present

## 2023-03-14 DIAGNOSIS — E875 Hyperkalemia: Secondary | ICD-10-CM | POA: Diagnosis not present

## 2023-03-14 DIAGNOSIS — N186 End stage renal disease: Secondary | ICD-10-CM | POA: Diagnosis not present

## 2023-03-14 DIAGNOSIS — Z992 Dependence on renal dialysis: Secondary | ICD-10-CM | POA: Diagnosis not present

## 2023-03-14 DIAGNOSIS — N2581 Secondary hyperparathyroidism of renal origin: Secondary | ICD-10-CM | POA: Diagnosis not present

## 2023-03-17 ENCOUNTER — Telehealth: Payer: Self-pay

## 2023-03-17 ENCOUNTER — Other Ambulatory Visit: Payer: Self-pay | Admitting: Nurse Practitioner

## 2023-03-17 DIAGNOSIS — N2581 Secondary hyperparathyroidism of renal origin: Secondary | ICD-10-CM | POA: Diagnosis not present

## 2023-03-17 DIAGNOSIS — N186 End stage renal disease: Secondary | ICD-10-CM | POA: Diagnosis not present

## 2023-03-17 DIAGNOSIS — R17 Unspecified jaundice: Secondary | ICD-10-CM | POA: Diagnosis not present

## 2023-03-17 DIAGNOSIS — T8249XD Other complication of vascular dialysis catheter, subsequent encounter: Secondary | ICD-10-CM | POA: Diagnosis not present

## 2023-03-17 DIAGNOSIS — T82594A Other mechanical complication of infusion catheter, initial encounter: Secondary | ICD-10-CM | POA: Diagnosis not present

## 2023-03-17 DIAGNOSIS — D689 Coagulation defect, unspecified: Secondary | ICD-10-CM | POA: Diagnosis not present

## 2023-03-17 DIAGNOSIS — E875 Hyperkalemia: Secondary | ICD-10-CM | POA: Diagnosis not present

## 2023-03-17 DIAGNOSIS — Z992 Dependence on renal dialysis: Secondary | ICD-10-CM | POA: Diagnosis not present

## 2023-03-17 NOTE — Progress Notes (Signed)
Care Management & Coordination Services Pharmacy Team  Reason for Encounter: Medication coordination and delivery  Contacted patient to discuss medications and coordinate delivery from [facility]. Spoke with patient on 03/20/2023  Cycle dispensing form sent to [name] for review.   Last adherence delivery date: 01-29-2023  Patient is due for next adherence delivery on:  03-31-2023  This delivery to include: Vials  30 Days  Eliquis 5 mg twice daily  Atorvastatin 40 mg daily  Patient declined the following medications this month: Fludrocort  Albuterol  Omeprazole  Advair   *plenty supply   No refill request needed.  Confirmed delivery date of 03-31-2023, advised patient that pharmacy will contact them the morning of delivery.   Any concerns about your medications? No  How often do you forget or accidentally miss a dose? Never  Do you use a pillbox? No  Is patient in packaging No   Recent blood pressure readings are as follows: 02-18-2023 130/80   Chart review: Recent office visits:  03-18-2023 [name], RN (CC)  Recent consult visits:  02/18/2023- [name], MD (Cardiology)- Cromolyn Sodium 4% and Sevelamer HCL 800 mg discontinued.   2/8/2024Rodman Key Hunsucker. MD (Pulmonology) -No medication changes   Hospital visits:  None in previous 6 months  Medications: Outpatient Encounter Medications as of 03/17/2023  Medication Sig Note   acetaminophen (TYLENOL) 325 MG tablet Take 650 mg by mouth daily as needed for headache.    acetaminophen (TYLENOL) 650 MG CR tablet Take 650 mg by mouth 3 (three) times daily as needed (arthritis).    albuterol (VENTOLIN HFA) 108 (90 Base) MCG/ACT inhaler Inhale 2 puffs into the lungs every 6 (six) hours as needed for wheezing or shortness of breath.    Ascorbic Acid (VITAMIN C) 100 MG tablet Take 100 mg by mouth daily.    atorvastatin (LIPITOR) 40 MG tablet Take 1 tablet (40 mg total) by mouth daily.    AURYXIA  1 GM 210 MG(Fe) tablet Take 420-840 mg by mouth See admin instructions. Take 840mg  by mouth three times daily with meals and 420mg  with snacks. Once supply is used up, patient will switch to Sevelamer (Patient not taking: Reported on 03/18/2023)    BIOTIN SL Place 10,000 mLs under the tongue daily.    diclofenac Sodium (VOLTAREN) 1 % GEL Apply 2 g topically daily as needed (pain).    Doxercalciferol (HECTOROL IV)  12/06/2022: Given at dialysis   ELIQUIS 5 MG TABS tablet TAKE ONE TABLET BY MOUTH TWICE DAILY    fludrocortisone (FLORINEF) 0.1 MG tablet Take 1 tablet (0.1 mg total) by mouth every other day. Take in evening prior to dialysis days    fluticasone (FLONASE) 50 MCG/ACT nasal spray Place 2 sprays into both nostrils daily. (Patient taking differently: Place 2 sprays into both nostrils daily as needed for allergies.)    fluticasone-salmeterol (ADVAIR HFA) 115-21 MCG/ACT inhaler Inhale 2 puffs into the lungs 2 (two) times daily.    meclizine (ANTIVERT) 12.5 MG tablet Take 1 tablet (12.5 mg total) by mouth 3 (three) times daily as needed for dizziness.    midodrine (PROAMATINE) 10 MG tablet Take 1 tablet (10 mg total) by mouth every other day. Take on the days of dialysis    multivitamin (RENA-VIT) TABS tablet Take 1 tablet by mouth daily. Patient is taking 1 tablet every night.    omeprazole (PRILOSEC) 20 MG capsule TAKE ONE CAPSULE BY MOUTH ONCE DAILY    vitamin E 200 UNIT capsule Take  200 Units by mouth daily.    No facility-administered encounter medications on file as of 03/17/2023.   BP Readings from Last 3 Encounters:  02/18/23 130/80  02/06/23 130/72  01/16/23 109/69    Pulse Readings from Last 3 Encounters:  02/18/23 99  02/06/23 (!) 109  01/16/23 80    Lab Results  Component Value Date/Time   HGBA1C 5.9 (H) 11/15/2021 11:45 AM   HGBA1C 6.2 (H) 07/16/2021 02:02 PM   Lab Results  Component Value Date   CREATININE 9.10 (H) 12/10/2022   BUN 32 (H) 12/10/2022   GFRNONAA 4 (L)  05/20/2022   GFRAA 7 (L) 05/18/2020   NA 136 12/10/2022   K 4.5 12/10/2022   CALCIUM 9.3 05/30/2022   CO2 21 05/30/2022    [place] Clinical Pharmacist Assistant [phone]   03-17-2023: left patient VM to clarify if mediction delivery is needed this month since patient denied last month's delivery.  03-18-2023 left patient VM to clarify if mediction delivery is needed this month since patient denied last month's delivery.  [place] Pharmacist Assistant [phone]

## 2023-03-18 ENCOUNTER — Ambulatory Visit: Payer: Self-pay

## 2023-03-18 NOTE — Patient Outreach (Signed)
  Care Coordination   Follow Up Visit Note   03/18/2023 Name: Whitney Walker MRN: [id number] DOB: 10-Jun-1956  Whitney Walker is a 67 y.o. year old female who sees [name], FNP for primary care. I spoke with  Whitney Walker by phone today.  What matters to the patients health and wellness today?  Patient will adhere to MD recommendations to manage her COPD and CHF. She will discuss concerns related to her elevated potassium with her kidney doctor.     Goals Addressed               This Visit's Progress     Patient Stated     I will consider seeing a Pulmonologist for my COPD (pt-stated)        Care Coordination Interventions: Evaluation of current treatment plan related to COPD and patient's adherence to plan as established by provider Discussed and reviewed with patient recent MD follow up with Pulmonology for evaluation of COPD Review of patient status, including review of consultant's reports, relevant laboratory and other test results, and medications completed DOE: Primarily related to fluid accumulation between HD sessions as she reports improvement after HD and gradual worsening the next day between HD sessions as well as worsening when she is less judicious with fluid restriction. Prior spirometry not consistent with COPD. Inhalers have not helped in the past. No changes to medications, agree with continuing Advair and PRN albuterol given bronchial wall thickening on CT 07/2022. PFT for further evaluation.  Return in about 2 months (around 04/07/2023) Reviewed and discussed recent Cardiology follow up with Dr. Einar Gip Review of patient status, including review of consultant's reports, relevant laboratory and other test results, and medications completed Dyspnea on exertion Her dyspnea is related to obesity hypoventilation, underlying mild COPD, I reviewed the notes from pulmonary medicine, they suspect diastolic heart failure also to be the etiology as she does  feel better postdialysis days.  I agree with this, patient has morbid obesity and also not sure about her diet.  Chronic diastolic heart failure (HCC) Chronic diastolic heart failure has remained stable, no clinical evidence of acute decompensated failure, lungs are clear, no JVD and no leg edema.  She is not on guideline directed medical therapy in view of severe orthostatic hypotension.  Office visit in 6 months with repeat carotid artery duplex Determined patient verbalizes understanding of her prescribed treatment plan and MD recommendations      Interventions Today    Flowsheet Row Most Recent Value  Chronic Disease   Chronic disease during today's visit Chronic Kidney Disease/End Stage Renal Disease (ESRD), Chronic Obstructive Pulmonary Disease (COPD), Congestive Heart Failure (CHF)  General Interventions   General Interventions Discussed/Reviewed General Interventions Discussed, General Interventions Reviewed, Doctor Visits  Doctor Visits Discussed/Reviewed Doctor Visits Discussed, Doctor Visits Reviewed, Specialist  Education Interventions   Education Provided Provided Education  Provided Verbal Education On Medication, Other  [hemodialysis]  Nutrition Interventions   Nutrition Discussed/Reviewed Fluid intake  [renal diet as directed, limit foods with potassium]  Pharmacy Interventions   Pharmacy Dicussed/Reviewed Medications and their functions, Pharmacy Topics Discussed      SDOH assessments and interventions completed:  No     Care Coordination Interventions:  Yes, provided   Follow up plan: Follow up call scheduled for 05/20/23 @09 :30 AM    Encounter Outcome:  Pt. Visit Completed

## 2023-03-18 NOTE — Patient Instructions (Signed)
Visit Information  Thank you for taking time to visit with me today. Please don't hesitate to contact me if I can be of assistance to you.   Following are the goals we discussed today:   Goals Addressed               This Visit's Progress     Patient Stated     I will consider seeing a Pulmonologist for my COPD (pt-stated)        Care Coordination Interventions: Evaluation of current treatment plan related to COPD and patient's adherence to plan as established by provider Discussed and reviewed with patient recent MD follow up with Pulmonology for evaluation of COPD Review of patient status, including review of consultant's reports, relevant laboratory and other test results, and medications completed DOE: Primarily related to fluid accumulation between HD sessions as she reports improvement after HD and gradual worsening the next day between HD sessions as well as worsening when she is less judicious with fluid restriction. Prior spirometry not consistent with COPD. Inhalers have not helped in the past. No changes to medications, agree with continuing Advair and PRN albuterol given bronchial wall thickening on CT 07/2022. PFT for further evaluation.  Return in about 2 months (around 04/07/2023) Reviewed and discussed recent Cardiology follow up with Dr. Einar Gip Review of patient status, including review of consultant's reports, relevant laboratory and other test results, and medications completed Dyspnea on exertion Her dyspnea is related to obesity hypoventilation, underlying mild COPD, I reviewed the notes from pulmonary medicine, they suspect diastolic heart failure also to be the etiology as she does feel better postdialysis days.  I agree with this, patient has morbid obesity and also not sure about her diet.  Chronic diastolic heart failure (HCC) Chronic diastolic heart failure has remained stable, no clinical evidence of acute decompensated failure, lungs are clear, no JVD and no leg  edema.  She is not on guideline directed medical therapy in view of severe orthostatic hypotension.  Office visit in 6 months with repeat carotid artery duplex Determined patient verbalizes understanding of her prescribed treatment plan and MD recommendations               Our next appointment is by telephone on 05/20/23 at 09:30 AM  Please call the care guide team at [phone] if you need to cancel or reschedule your appointment.   If you are experiencing a Mental Health or [place] or need someone to talk to, please call 1-800-273-TALK (toll free, 24 hour hotline) go to [facility] [address] [place] [phone])  Patient verbalizes understanding of instructions and care plan provided today and agrees to view in [place]. Active MyChart status and patient understanding of how to access instructions and care plan via MyChart confirmed with patient.     [name], RN, BSN, CCM Care Management Coordinator [facility] Care Management  Direct Phone: [phone]

## 2023-03-19 DIAGNOSIS — T82594A Other mechanical complication of infusion catheter, initial encounter: Secondary | ICD-10-CM | POA: Diagnosis not present

## 2023-03-19 DIAGNOSIS — Z992 Dependence on renal dialysis: Secondary | ICD-10-CM | POA: Diagnosis not present

## 2023-03-19 DIAGNOSIS — N186 End stage renal disease: Secondary | ICD-10-CM | POA: Diagnosis not present

## 2023-03-19 DIAGNOSIS — N2581 Secondary hyperparathyroidism of renal origin: Secondary | ICD-10-CM | POA: Diagnosis not present

## 2023-03-19 DIAGNOSIS — D689 Coagulation defect, unspecified: Secondary | ICD-10-CM | POA: Diagnosis not present

## 2023-03-19 DIAGNOSIS — E875 Hyperkalemia: Secondary | ICD-10-CM | POA: Diagnosis not present

## 2023-03-19 DIAGNOSIS — T8249XD Other complication of vascular dialysis catheter, subsequent encounter: Secondary | ICD-10-CM | POA: Diagnosis not present

## 2023-03-19 DIAGNOSIS — R17 Unspecified jaundice: Secondary | ICD-10-CM | POA: Diagnosis not present

## 2023-03-20 ENCOUNTER — Ambulatory Visit (INDEPENDENT_AMBULATORY_CARE_PROVIDER_SITE_OTHER): Payer: 59

## 2023-03-20 VITALS — BP 108/56 | HR 105 | Temp 98.1°F | Ht 62.0 in | Wt 204.8 lb

## 2023-03-20 DIAGNOSIS — Z0000 Encounter for general adult medical examination without abnormal findings: Secondary | ICD-10-CM

## 2023-03-20 NOTE — Progress Notes (Signed)
Subjective:   Whitney Walker is a 67 y.o. female who presents for Medicare Annual (Subsequent) preventive examination.  Review of Systems     Cardiac Risk Factors include: advanced age (>69men, >10 women);hypertension;obesity (BMI >30kg/m2);smoking/ tobacco exposure     Objective:    Today's Vitals   03/20/23 1510  BP: (!) 108/56  Pulse: (!) 105  Temp: 98.1 F (36.7 C)  TempSrc: Oral  SpO2: 95%  Weight: 204 lb 12.8 oz (92.9 kg)  Height: 5\' 2"  (1.575 m)   Body mass index is 37.46 kg/m.     03/20/2023    3:19 PM 12/10/2022   10:57 AM 05/16/2022    5:04 PM 05/16/2022    2:07 PM 05/01/2022    7:57 AM 03/14/2022   11:05 AM 08/09/2021   11:20 AM  Advanced Directives  Does Patient Have a Medical Advance Directive? No No No No No No No  Would patient like information on creating a medical advance directive? No - Patient declined No - Patient declined No - Patient declined No - Patient declined  No - Patient declined No - Patient declined    Current Medications (verified) Outpatient Encounter Medications as of 03/20/2023  Medication Sig   acetaminophen (TYLENOL) 325 MG tablet Take 650 mg by mouth daily as needed for headache.   acetaminophen (TYLENOL) 650 MG CR tablet Take 650 mg by mouth 3 (three) times daily as needed (arthritis).   albuterol (VENTOLIN HFA) 108 (90 Base) MCG/ACT inhaler Inhale 2 puffs into the lungs every 6 (six) hours as needed for wheezing or shortness of breath.   atorvastatin (LIPITOR) 40 MG tablet Take 1 tablet (40 mg total) by mouth daily.   BIOTIN SL Place 10,000 mLs under the tongue daily.   diclofenac Sodium (VOLTAREN) 1 % GEL Apply 2 g topically daily as needed (pain).   Doxercalciferol (HECTOROL IV)    ELIQUIS 5 MG TABS tablet TAKE ONE TABLET BY MOUTH TWICE DAILY   fluticasone (FLONASE) 50 MCG/ACT nasal spray Place 2 sprays into both nostrils daily. (Patient taking differently: Place 2 sprays into both nostrils daily as needed for allergies.)    fluticasone-salmeterol (ADVAIR HFA) 115-21 MCG/ACT inhaler Inhale 2 puffs into the lungs 2 (two) times daily.   meclizine (ANTIVERT) 12.5 MG tablet Take 1 tablet (12.5 mg total) by mouth 3 (three) times daily as needed for dizziness.   multivitamin (RENA-VIT) TABS tablet Take 1 tablet by mouth daily. Patient is taking 1 tablet every night.   omeprazole (PRILOSEC) 20 MG capsule TAKE ONE CAPSULE BY MOUTH ONCE DAILY   sevelamer (RENAGEL) 800 MG tablet Take 800 mg by mouth 3 (three) times daily with meals. 3 tablets 3 times daily with meals, 2 tablets up to twice daily with snacks   vitamin E 200 UNIT capsule Take 200 Units by mouth daily.   Ascorbic Acid (VITAMIN C) 100 MG tablet Take 100 mg by mouth daily. (Patient not taking: Reported on 03/20/2023)   AURYXIA 1 GM 210 MG(Fe) tablet Take 420-840 mg by mouth See admin instructions. Take 840mg  by mouth three times daily with meals and 420mg  with snacks. Once supply is used up, patient will switch to Sevelamer (Patient not taking: Reported on 03/18/2023)   fludrocortisone (FLORINEF) 0.1 MG tablet Take 1 tablet (0.1 mg total) by mouth every other day. Take in evening prior to dialysis days (Patient not taking: Reported on 03/20/2023)   midodrine (PROAMATINE) 10 MG tablet Take 1 tablet (10 mg total) by mouth every  other day. Take on the days of dialysis (Patient not taking: Reported on 03/20/2023)   No facility-administered encounter medications on file as of 03/20/2023.    Allergies (verified) Carnosine, Gadolinium derivatives, Iohexol, Iodinated contrast media, Iodine, and Naltrexone   History: Past Medical History:  Diagnosis Date   Anemia    Anxiety    CHF (congestive heart failure) (HCC)    Colon cancer (HCC)    treatment surgery   Complication of anesthesia    after first C- [place] "couldnt walk after", patient denies having a spinal   COPD (chronic obstructive pulmonary disease) ([place])    Coronary artery disease    Depression    DVT (deep  venous thrombosis) (HCC)    ESRD on hemodialysis ([place])    Hemo: MWF   History of blood transfusion 04/2018   Hypertension    07/07/18- no longer takes BP medications   Meningitis    Pain in limb 07/30/2013   PE (pulmonary embolism)    Peripheral vascular disease ([place])    Primary hypertension 11/15/2013   Restless legs    Shortness of breath    with exertion   Sleep apnea    SOB (shortness of breath) 03/03/2019   Vertigo    Past Surgical History:  Procedure Laterality Date   ABDOMINAL AORTAGRAM N/A 07/26/2013   Procedure: ABDOMINAL Maxcine Ham;  Surgeon: [name] Crisfield, MD;  Location: [facility] CATH LAB;  Service: Cardiovascular;  Laterality: N/A;   ABDOMINAL HYSTERECTOMY     AV FISTULA PLACEMENT Left 05/05/2018   Procedure: ARTERIOVENOUS (AV) FISTULA CREATION BRACHIOCEPHALIC;  Surgeon: Waynetta Sandy, MD;  Location: [place];  Service: Vascular;  Laterality: Left;   AV FISTULA PLACEMENT Left 07/16/2018   Procedure: ARTERIOVENOUS FISTULA CREATION;  Surgeon: Waynetta Sandy, MD;  Location: [place];  Service: Vascular;  Laterality: Left;   [place] Left 09/03/2018   Procedure: BASILIC VEIN TRANSPOSITION SECOND STAGE;  Surgeon: Waynetta Sandy, MD;  Location: [place];  Service: Vascular;  Laterality: Left;   BREAST BIOPSY Left    CESAREAN SECTION     X 3 1974-1977   CHOLECYSTECTOMY     CHOLECYSTECTOMY  1980/s   COLECTOMY  2010   DIVERTICULOSIS SURGERY-2002  2012   FEMORAL-POPLITEAL BYPASS GRAFT Left 08/13/2013   Procedure: BYPASS GRAFT FEMORAL-POPLITEAL ARTERY WITH NON-REVERSED SAPHANEOUS VEIN; ULTRASOUND GUIDED;  Surgeon: Mal [name], MD;  Location: [place];  Service: Vascular;  Laterality: Left;   FLEXIBLE SIGMOIDOSCOPY N/A 12/10/2022   Procedure: FLEXIBLE SIGMOIDOSCOPY;  Surgeon: [name], MD;  Location: [facility];  Service: Gastroenterology;  Laterality: N/A;   INSERTION OF DIALYSIS CATHETER Right 04/30/2018   Procedure: INSERTION OF Right  Internal Jugular DIALYSIS CATHETER;  Surgeon: [name], MD;  Location: [facility] OR;  Service: Vascular;  Laterality: Right;   IR FLUORO GUIDE CV LINE LEFT  05/01/2022   IR FLUORO GUIDE CV LINE LEFT  05/21/2022   IR FLUORO GUIDE CV LINE RIGHT  07/24/2020   IR FLUORO GUIDE CV LINE RIGHT  07/21/2021   IR FLUORO GUIDE CV LINE RIGHT  05/20/2022   IR PTA VENOUS EXCEPT DIALYSIS CIRCUIT  05/21/2022   IR REMOVAL TUN CV CATH W/O FL  07/21/2020   IR REMOVAL TUN CV CATH W/O FL  07/19/2021   IR REMOVAL TUN CV CATH W/O FL  05/17/2022   IR US GUIDE VASC ACCESS LEFT  05/21/2022   IR US GUIDE VASC ACCESS RIGHT  07/24/2020   IR US GUIDE VASC  ACCESS RIGHT  07/21/2021   IR US GUIDE VASC ACCESS RIGHT  05/20/2022   IR VENOCAVAGRAM SVC  05/21/2022   LEFT HEART CATH AND CORONARY ANGIOGRAPHY N/A 05/01/2021   Procedure: LEFT HEART CATH AND CORONARY ANGIOGRAPHY;  Surgeon: [name], MD;  Location: [place] CV LAB;  Service: Cardiovascular;  Laterality: N/A;   LEFT HEART CATH AND CORONARY ANGIOGRAPHY N/A 05/02/2022   Procedure: LEFT HEART CATH AND CORONARY ANGIOGRAPHY;  Surgeon: [name], MD;  Location: [place] CV LAB;  Service: Cardiovascular;  Laterality: N/A;   LOWER EXTREMITY ANGIOGRAM Left 07/26/2013   Procedure: LOWER EXTREMITY ANGIOGRAM;  Surgeon: [name] Harlan, MD;  Location: [facility] CATH LAB;  Service: Cardiovascular;  Laterality: Left;   Family History  Problem Relation Age of Onset   Cancer Mother 55       breast and bone   Cancer Father 13       prostate   Hypertension Sister    Bleeding Disorder Sister    Cancer Cousin 36       breast cancer    Hypertension Daughter    Breast cancer Neg Hx    Social History   Socioeconomic History   Marital status: Single    Spouse name: Not on file   Number of children: 4   Years of education: 12th gd   Highest education level: Not on file  Occupational History   Occupation: N/A  Tobacco Use   Smoking status: Former    Packs/day: 0.50    Years: 50.00     Additional pack years: 0.00    Total pack years: 25.00    Types: Cigarettes    Quit date: 10/13/2022    Years since quitting: 0.4   Smokeless tobacco: Former   Tobacco comments:    She is using a vape when she runs out of her cigarettes, 7/27 - down to 1-2 cigs a day  Vaping Use   Vaping Use: Every day   Start date: 06/28/2020   Last attempt to quit: 05/20/2022   Devices: Vuse  Substance and Sexual Activity   Alcohol use: No    Alcohol/week: 0.0 standard drinks of alcohol    Comment: she used to drink alcohol, quit in 2010; h/o heavy use    Drug use: Not Currently    Types: Oxycodone   Sexual activity: Not Currently  Other Topics Concern   Not on file  Social History Narrative   2 cups of coffee a day    Social Determinants of Health   Financial Resource Strain: Low Risk  (03/20/2023)   Overall Financial Resource Strain (CARDIA)    Difficulty of Paying Living Expenses: Not hard at all  Food Insecurity: No Food Insecurity (03/20/2023)   Hunger Vital Sign    Worried About Running Out of Food in the Last Year: Never true    Ran Out of Food in the Last Year: Never true  Transportation Needs: No Transportation Needs (03/20/2023)   PRAPARE - Hydrologist (Medical): No    Lack of Transportation (Non-Medical): No  Physical Activity: Inactive (03/20/2023)   Exercise Vital Sign    Days of Exercise per Week: 0 days    Minutes of Exercise per Session: 0 min  Stress: No Stress Concern Present (03/20/2023)   [place]    Feeling of Stress : Not at all  Social Connections: Not on file    Tobacco Counseling Counseling given:  Not Answered Tobacco comments: She is using a vape when she runs out of her cigarettes, 7/27 - down to 1-2 cigs a day   Clinical Intake:  Pre-visit preparation completed: Yes  Pain : No/denies pain     Nutritional Status: BMI > 30  Obese Nutritional Risks:  None Diabetes: No  How often do you need to have someone help you when you read instructions, pamphlets, or other written materials from your doctor or pharmacy?: 1 - Never  Diabetic? no  Interpreter Needed?: No  Information entered by :: NAllen LPN   Activities of Daily Living    03/20/2023    3:20 PM 05/16/2022   11:00 PM  In your present state of health, do you have any difficulty performing the following activities:  Hearing? 0   Vision? 1   Difficulty concentrating or making decisions? 0   Walking or climbing stairs? 1   Dressing or bathing? 0   Doing errands, shopping? 0 1  Preparing Food and eating ? N   Using the Toilet? N   In the past six months, have you accidently leaked urine? N   Do you have problems with loss of bowel control? N   Managing your Medications? N   Managing your Finances? N   Housekeeping or managing your Housekeeping? N     Patient Care Team: [name], FNP as PCP - General (General Practice) Truitt Merle, MD as Consulting Physician (Hematology) [facility] [place], RN as [place] Management [name], MD as Consulting Physician (Cardiology)  Indicate any recent Medical Services you may have received from other than [facility] providers in the past year (date may be approximate).     Assessment:   This is a routine wellness examination for [facility].  Hearing/Vision screen Vision Screening - Comments:: No regular eye exams  Dietary issues and exercise activities discussed: Current Exercise Habits: The patient does not participate in regular exercise at present   Goals Addressed             This Visit's Progress    Patient Stated       03/20/2023, no goals       Depression Screen    03/20/2023    3:20 PM 10/08/2022   10:34 AM 07/25/2022    2:15 PM 07/04/2022    9:19 AM 03/14/2022   11:06 AM 09/04/2021    2:37 PM 08/09/2021   11:21 AM  PHQ 2/9 Scores  PHQ - 2 Score 0 0 0 0  1 0   Exception Documentation       Other- indicate reason in comment box  Not completed       completed yesterday    Fall Risk    03/20/2023    3:19 PM 10/08/2022   10:34 AM 07/25/2022    2:14 PM 07/04/2022    9:19 AM 03/14/2022   11:06 AM  Fall Risk   Falls in the past year? 1 0 1 1 1   Comment lost balance    had vertigo  Number falls in past yr: 0 0 1 1 1   Injury with Fall? 0 0 0 0 0  Risk for fall due to : Medication side effect No Fall Risks History of fall(s) Impaired balance/gait History of fall(s);Impaired balance/gait;Medication side effect  Follow up Falls prevention discussed;Education provided;Falls evaluation completed Falls evaluation completed Falls evaluation completed Falls evaluation completed Falls evaluation completed;Education provided;Falls prevention discussed    FALL RISK  PREVENTION PERTAINING TO THE HOME:  Any stairs in or around the home? No  If so, are there any without handrails? N/a Home free of loose throw rugs in walkways, pet beds, electrical cords, etc? Yes  Adequate lighting in your home to reduce risk of falls? Yes   ASSISTIVE DEVICES UTILIZED TO PREVENT FALLS:  Life alert? No  Use of a cane, walker or w/c? No  Grab bars in the bathroom? Yes  Shower chair or bench in shower? No  Elevated toilet seat or a handicapped toilet? No   TIMED UP AND GO:  Was the test performed? Yes .  Length of time to ambulate 10 feet: 5 sec.   Gait slow and steady without use of assistive device  Cognitive Function:    03/19/2017    2:29 PM 07/31/2016    2:18 PM 02/05/2016    2:00 PM 05/04/2015    2:34 PM  MMSE - Mini Mental State Exam  Orientation to time 4 5 5 5   Orientation to Place 4 5 5 4   Registration 3 3 3 3   Attention/ Calculation 1 4 4 4   Recall 1 2 2 3   Language- name 2 objects 2 2 2 2   Language- repeat 1 1 1 1   Language- follow 3 step command 3 3 3 2   Language- read & follow direction 1 1 1 1   Write a sentence 1 1 1 1   Copy design 1 1 1 1    Total score 22 28 28 27         03/20/2023    3:21 PM 03/14/2022   11:08 AM 06/10/2019    2:27 PM  6CIT Screen  What Year? 0 points 0 points 0 points  What month? 0 points 0 points 0 points  What time? 0 points 0 points 0 points  Count back from 20 2 points 0 points 0 points  Months in reverse 0 points 0 points 0 points  Repeat phrase 4 points 0 points 0 points  Total Score 6 points 0 points 0 points    Immunizations Immunization History  Administered Date(s) Administered   Fluad Quad(high Dose 65+) 10/04/2019   Hepatitis B, ADULT 09/28/2018, 05/19/2020   Influenza, High Dose Seasonal PF 12/29/2020, 10/10/2021   Influenza,inj,Quad PF,6+ Mos 10/04/2019, 10/04/2019   Influenza,inj,quad, With Preservative 09/23/2018   Moderna Sars-Covid-2 Vaccination 03/10/2020, 04/07/2020, 12/22/2020   Pneumococcal Conjugate-13 04/20/2021   Pneumococcal Polysaccharide-23 03/22/2019    TDAP status: Due, Education has been provided regarding the importance of this vaccine. Advised may receive this vaccine at local pharmacy or Health Dept. Aware to provide a copy of the vaccination record if obtained from local pharmacy or Health Dept. Verbalized acceptance and understanding.  Flu Vaccine status: Up to date  Pneumococcal vaccine status: Up to date  Covid-19 vaccine status: Completed vaccines  Qualifies for Shingles Vaccine? Yes   Zostavax completed No   Shingrix Completed?: No.    Education has been provided regarding the importance of this vaccine. Patient has been advised to call insurance company to determine out of pocket expense if they have not yet received this vaccine. Advised may also receive vaccine at local pharmacy or Health Dept. Verbalized acceptance and understanding.  Screening Tests Health Maintenance  Topic Date Due   DTaP/Tdap/Td (1 - Tdap) Never done   DEXA SCAN  Never done   COLONOSCOPY (Pts 45-83yrs Insurance coverage will need to be confirmed)  06/05/2022   COVID-19  Vaccine (4 - 2023-24 season) 08/30/2022  Medicare Annual Wellness (AWV)  03/15/2023   Zoster Vaccines- Shingrix (1 of 2) 04/03/2023 (Originally 06/29/2005)   Lung Cancer Screening  08/09/2023   Pneumonia Vaccine 68+ Years old (3 of 3 - PPSV23 or PCV20) 03/21/2024   MAMMOGRAM  01/30/2025   INFLUENZA VACCINE  Completed   Hepatitis C Screening  Completed   HPV VACCINES  Aged Out    Health Maintenance  Health Maintenance Due  Topic Date Due   DTaP/Tdap/Td (1 - Tdap) Never done   DEXA SCAN  Never done   COLONOSCOPY (Pts 45-31yrs Insurance coverage will need to be confirmed)  06/05/2022   COVID-19 Vaccine (4 - 2023-24 season) 08/30/2022   Medicare Annual Wellness (AWV)  03/15/2023    Colorectal cancer screening: Type of screening: Sigmoidoscopy. Completed 12/10/2022. Repeat every 5 years  Mammogram status: Completed 01/30/2023. Repeat every year  Bone Density status: decline  Lung Cancer Screening: (Low Dose CT Chest recommended if Age 16-80 years, 30 pack-year currently smoking OR have quit w/in 15years.) does qualify.   Lung Cancer Screening Referral: CT scan 08/08/2022  Additional Screening:  Hepatitis C Screening: does qualify; Completed 05/29/2021  Vision Screening: Recommended annual ophthalmology exams for early detection of glaucoma and other disorders of the eye. Is the patient up to date with their annual eye exam?  No  Who is the provider or what is the name of the office in which the patient attends annual eye exams? none If pt is not established with a provider, would they like to be referred to a provider to establish care? No .   Dental Screening: Recommended annual dental exams for proper oral hygiene  Community Resource Referral / Chronic Care Management: CRR required this visit?  No   CCM required this visit?  No      Plan:     I have personally reviewed and noted the following in the patient's chart:   Medical and social history Use of alcohol, tobacco  or illicit drugs  Current medications and supplements including opioid prescriptions. Patient is not currently taking opioid prescriptions. Functional ability and status Nutritional status Physical activity Advanced directives List of other physicians Hospitalizations, surgeries, and ER visits in previous 12 months Vitals Screenings to include cognitive, depression, and falls Referrals and appointments  In addition, I have reviewed and discussed with patient certain preventive protocols, quality metrics, and best practice recommendations. A written personalized care plan for preventive services as well as general preventive health recommendations were provided to patient.     Kellie Simmering, LPN   [id number]   Nurse Notes: none

## 2023-03-20 NOTE — Patient Instructions (Signed)
Whitney Walker , Thank you for taking time to come for your Medicare Wellness Visit. I appreciate your ongoing commitment to your health goals. Please review the following plan we discussed and let me know if I can assist you in the future.   These are the goals we discussed:  Goals       I will consider seeing a Pulmonologist for my COPD (pt-stated)      Care Coordination Interventions: Evaluation of current treatment plan related to COPD and patient's adherence to plan as established by provider Discussed and reviewed with patient recent MD follow up with Pulmonology for evaluation of COPD Review of patient status, including review of consultant's reports, relevant laboratory and other test results, and medications completed DOE: Primarily related to fluid accumulation between HD sessions as she reports improvement after HD and gradual worsening the next day between HD sessions as well as worsening when she is less judicious with fluid restriction. Prior spirometry not consistent with COPD. Inhalers have not helped in the past. No changes to medications, agree with continuing Advair and PRN albuterol given bronchial wall thickening on CT 07/2022. PFT for further evaluation.  Return in about 2 months (around 04/07/2023) Reviewed and discussed recent Cardiology follow up with Dr. Einar Gip Review of patient status, including review of consultant's reports, relevant laboratory and other test results, and medications completed Dyspnea on exertion Her dyspnea is related to obesity hypoventilation, underlying mild COPD, I reviewed the notes from pulmonary medicine, they suspect diastolic heart failure also to be the etiology as she does feel better postdialysis days.  I agree with this, patient has morbid obesity and also not sure about her diet.  Chronic diastolic heart failure (HCC) Chronic diastolic heart failure has remained stable, no clinical evidence of acute decompensated failure, lungs are clear, no JVD  and no leg edema.  She is not on guideline directed medical therapy in view of severe orthostatic hypotension.  Office visit in 6 months with repeat carotid artery duplex Determined patient verbalizes understanding of her prescribed treatment plan and MD recommendations             Manage My Medicine      Timeframe:  Long-Range Goal Priority:  High Start Date:                             Expected End Date:                       Follow Up Date 06/14/2021   - call for medicine refill 2 or 3 days before it runs out - keep a list of all the medicines I take; vitamins and herbals too - learn to read medicine labels - use a pillbox to sort medicine - use an alarm clock or phone to remind me to take my medicine    Why is this important?   These steps will help you keep on track with your medicines.       Patient Stated      No goals      Patient Stated      08/03/2020, no goals      Patient Stated      08/09/2021, no goals      Patient Stated      03/14/2022, no goals      Patient Stated      03/20/2023, no goals      To understand why  my Atorvastatin was increased (pt-stated)      Care Coordination Interventions: Determined patient completed a recent follow up with Dr. Einar Gip, Cardiologist Review of patient status, including review of consultant's reports, relevant laboratory and other test results, and medications completed Reviewed Importance of taking all medications as prescribed Advised patient to discuss new symptoms or concerns with provider Reviewed scheduled/upcoming provider appointments including: next Cardiology follow up appointment with Dr. Einar Gip scheduled for 02/18/23 @10 :30 AM; PCV-ECHO/VAS 1 scheduled for 07/17/23 @1 :00 PM          This is a list of the screening recommended for you and due dates:  Health Maintenance  Topic Date Due   DTaP/Tdap/Td vaccine (1 - Tdap) Never done   DEXA scan (bone density measurement)  Never done   Colon Cancer Screening   06/05/2022   COVID-19 Vaccine (4 - 2023-24 season) 08/30/2022   Zoster (Shingles) Vaccine (1 of 2) 04/03/2023*   Screening for Lung Cancer  08/09/2023   Medicare Annual Wellness Visit  03/19/2024   Pneumonia Vaccine (3 of 3 - PPSV23 or PCV20) 03/21/2024   Mammogram  01/30/2025   Flu Shot  Completed   Hepatitis C Screening: USPSTF Recommendation to screen - Ages 18-79 yo.  Completed   HPV Vaccine  Aged Out  *Topic was postponed. The date shown is not the original due date.    Advanced directives: Advance directive discussed with you today. Even though you declined this today please call our office should you change your mind and we can give you the proper paperwork for you to fill out.  Conditions/risks identified: smoking  Next appointment: Follow up in one year for your annual wellness visit    Preventive Care 8 Years and Older, Female Preventive care refers to lifestyle choices and visits with your health care provider that can promote health and wellness. What does preventive care include? A yearly physical exam. This is also called an annual well check. Dental exams once or twice a year. Routine eye exams. Ask your health care provider how often you should have your eyes checked. Personal lifestyle choices, including: Daily care of your teeth and gums. Regular physical activity. Eating a healthy diet. Avoiding tobacco and drug use. Limiting alcohol use. Practicing safe sex. Taking low-dose aspirin every day. Taking vitamin and mineral supplements as recommended by your health care provider. What happens during an annual well check? The services and screenings done by your health care provider during your annual well check will depend on your age, overall health, lifestyle risk factors, and family history of disease. Counseling  Your health care provider may ask you questions about your: Alcohol use. Tobacco use. Drug use. Emotional well-being. Home and relationship  well-being. Sexual activity. Eating habits. History of falls. Memory and ability to understand (cognition). Work and work Statistician. Reproductive health. Screening  You may have the following tests or measurements: Height, weight, and BMI. Blood pressure. Lipid and cholesterol levels. These may be checked every 5 years, or more frequently if you are over 33 years old. Skin check. Lung cancer screening. You may have this screening every year starting at age 30 if you have a 30-pack-year history of smoking and currently smoke or have quit within the past 15 years. Fecal occult blood test (FOBT) of the stool. You may have this test every year starting at age 39. Flexible sigmoidoscopy or colonoscopy. You may have a sigmoidoscopy every 5 years or a colonoscopy every 10 years starting at age 69. Hepatitis C  blood test. Hepatitis B blood test. Sexually transmitted disease (STD) testing. Diabetes screening. This is done by checking your blood sugar (glucose) after you have not eaten for a while (fasting). You may have this done every 1-3 years. Bone density scan. This is done to screen for osteoporosis. You may have this done starting at age 60. Mammogram. This may be done every 1-2 years. Talk to your health care provider about how often you should have regular mammograms. Talk with your health care provider about your test results, treatment options, and if necessary, the need for more tests. Vaccines  Your health care provider may recommend certain vaccines, such as: Influenza vaccine. This is recommended every year. Tetanus, diphtheria, and acellular pertussis (Tdap, Td) vaccine. You may need a Td booster every 10 years. Zoster vaccine. You may need this after age 44. Pneumococcal 13-valent conjugate (PCV13) vaccine. One dose is recommended after age 72. Pneumococcal polysaccharide (PPSV23) vaccine. One dose is recommended after age 57. Talk to your health care provider about which  screenings and vaccines you need and how often you need them. This information is not intended to replace advice given to you by your health care provider. Make sure you discuss any questions you have with your health care provider. Document Released: 01/12/2016 Document Revised: 09/04/2016 Document Reviewed: 10/17/2015 Elsevier Interactive Patient Education  2017 [employer] Prevention in the Home Falls can cause injuries. They can happen to people of all ages. There are many things you can do to make your home safe and to help prevent falls. What can I do on the outside of my home? Regularly fix the edges of walkways and driveways and fix any cracks. Remove anything that might make you trip as you walk through a door, such as a raised step or threshold. Trim any bushes or trees on the path to your home. Use bright outdoor lighting. Clear any walking paths of anything that might make someone trip, such as rocks or tools. Regularly check to see if handrails are loose or broken. Make sure that both sides of any steps have handrails. Any raised decks and porches should have guardrails on the edges. Have any leaves, snow, or ice cleared regularly. Use sand or salt on walking paths during winter. Clean up any spills in your garage right away. This includes oil or grease spills. What can I do in the bathroom? Use night lights. Install grab bars by the toilet and in the tub and shower. Do not use towel bars as grab bars. Use non-skid mats or decals in the tub or shower. If you need to sit down in the shower, use a plastic, non-slip stool. Keep the floor dry. Clean up any water that spills on the floor as soon as it happens. Remove soap buildup in the tub or shower regularly. Attach bath mats securely with double-sided non-slip rug tape. Do not have throw rugs and other things on the floor that can make you trip. What can I do in the bedroom? Use night lights. Make sure that you have a  light by your bed that is easy to reach. Do not use any sheets or blankets that are too big for your bed. They should not hang down onto the floor. Have a firm chair that has side arms. You can use this for support while you get dressed. Do not have throw rugs and other things on the floor that can make you trip. What can I do in the kitchen?  Clean up any spills right away. Avoid walking on wet floors. Keep items that you use a lot in easy-to-reach places. If you need to reach something above you, use a strong step stool that has a grab bar. Keep electrical cords out of the way. Do not use floor polish or wax that makes floors slippery. If you must use wax, use non-skid floor wax. Do not have throw rugs and other things on the floor that can make you trip. What can I do with my stairs? Do not leave any items on the stairs. Make sure that there are handrails on both sides of the stairs and use them. Fix handrails that are broken or loose. Make sure that handrails are as long as the stairways. Check any carpeting to make sure that it is firmly attached to the stairs. Fix any carpet that is loose or worn. Avoid having throw rugs at the top or bottom of the stairs. If you do have throw rugs, attach them to the floor with carpet tape. Make sure that you have a light switch at the top of the stairs and the bottom of the stairs. If you do not have them, ask someone to add them for you. What else can I do to help prevent falls? Wear shoes that: Do not have high heels. Have rubber bottoms. Are comfortable and fit you well. Are closed at the toe. Do not wear sandals. If you use a stepladder: Make sure that it is fully opened. Do not climb a closed stepladder. Make sure that both sides of the stepladder are locked into place. Ask someone to hold it for you, if possible. Clearly mark and make sure that you can see: Any grab bars or handrails. First and last steps. Where the edge of each step  is. Use tools that help you move around (mobility aids) if they are needed. These include: Canes. Walkers. Scooters. Crutches. Turn on the lights when you go into a dark area. Replace any light bulbs as soon as they burn out. Set up your furniture so you have a clear path. Avoid moving your furniture around. If any of your floors are uneven, fix them. If there are any pets around you, be aware of where they are. Review your medicines with your doctor. Some medicines can make you feel dizzy. This can increase your chance of falling. Ask your doctor what other things that you can do to help prevent falls. This information is not intended to replace advice given to you by your health care provider. Make sure you discuss any questions you have with your health care provider. Document Released: 10/12/2009 Document Revised: 05/23/2016 Document Reviewed: 01/20/2015 Elsevier Interactive Patient Education  2017 [employer].

## 2023-03-21 DIAGNOSIS — N186 End stage renal disease: Secondary | ICD-10-CM | POA: Diagnosis not present

## 2023-03-21 DIAGNOSIS — R17 Unspecified jaundice: Secondary | ICD-10-CM | POA: Diagnosis not present

## 2023-03-21 DIAGNOSIS — T8249XD Other complication of vascular dialysis catheter, subsequent encounter: Secondary | ICD-10-CM | POA: Diagnosis not present

## 2023-03-21 DIAGNOSIS — E875 Hyperkalemia: Secondary | ICD-10-CM | POA: Diagnosis not present

## 2023-03-21 DIAGNOSIS — D689 Coagulation defect, unspecified: Secondary | ICD-10-CM | POA: Diagnosis not present

## 2023-03-21 DIAGNOSIS — Z992 Dependence on renal dialysis: Secondary | ICD-10-CM | POA: Diagnosis not present

## 2023-03-21 DIAGNOSIS — T82594A Other mechanical complication of infusion catheter, initial encounter: Secondary | ICD-10-CM | POA: Diagnosis not present

## 2023-03-21 DIAGNOSIS — N2581 Secondary hyperparathyroidism of renal origin: Secondary | ICD-10-CM | POA: Diagnosis not present

## 2023-03-24 ENCOUNTER — Telehealth (HOSPITAL_COMMUNITY): Payer: Self-pay | Admitting: *Deleted

## 2023-03-24 DIAGNOSIS — N186 End stage renal disease: Secondary | ICD-10-CM | POA: Diagnosis not present

## 2023-03-24 DIAGNOSIS — T8249XD Other complication of vascular dialysis catheter, subsequent encounter: Secondary | ICD-10-CM | POA: Diagnosis not present

## 2023-03-24 DIAGNOSIS — Z992 Dependence on renal dialysis: Secondary | ICD-10-CM | POA: Diagnosis not present

## 2023-03-24 DIAGNOSIS — T82594A Other mechanical complication of infusion catheter, initial encounter: Secondary | ICD-10-CM | POA: Diagnosis not present

## 2023-03-24 DIAGNOSIS — R17 Unspecified jaundice: Secondary | ICD-10-CM | POA: Diagnosis not present

## 2023-03-24 DIAGNOSIS — D689 Coagulation defect, unspecified: Secondary | ICD-10-CM | POA: Diagnosis not present

## 2023-03-24 DIAGNOSIS — N2581 Secondary hyperparathyroidism of renal origin: Secondary | ICD-10-CM | POA: Diagnosis not present

## 2023-03-24 DIAGNOSIS — E875 Hyperkalemia: Secondary | ICD-10-CM | POA: Diagnosis not present

## 2023-03-24 NOTE — Telephone Encounter (Signed)
Received fax from Dr Santiago Bumpers requesting catheter exchange. Unable to run at [facility] due to alarms. Will give to [facility].

## 2023-03-26 DIAGNOSIS — T8249XD Other complication of vascular dialysis catheter, subsequent encounter: Secondary | ICD-10-CM | POA: Diagnosis not present

## 2023-03-26 DIAGNOSIS — T82594A Other mechanical complication of infusion catheter, initial encounter: Secondary | ICD-10-CM | POA: Diagnosis not present

## 2023-03-26 DIAGNOSIS — N186 End stage renal disease: Secondary | ICD-10-CM | POA: Diagnosis not present

## 2023-03-26 DIAGNOSIS — Z992 Dependence on renal dialysis: Secondary | ICD-10-CM | POA: Diagnosis not present

## 2023-03-26 DIAGNOSIS — D689 Coagulation defect, unspecified: Secondary | ICD-10-CM | POA: Diagnosis not present

## 2023-03-26 DIAGNOSIS — E875 Hyperkalemia: Secondary | ICD-10-CM | POA: Diagnosis not present

## 2023-03-26 DIAGNOSIS — N2581 Secondary hyperparathyroidism of renal origin: Secondary | ICD-10-CM | POA: Diagnosis not present

## 2023-03-26 DIAGNOSIS — R17 Unspecified jaundice: Secondary | ICD-10-CM | POA: Diagnosis not present

## 2023-03-28 DIAGNOSIS — Z992 Dependence on renal dialysis: Secondary | ICD-10-CM | POA: Diagnosis not present

## 2023-03-28 DIAGNOSIS — D689 Coagulation defect, unspecified: Secondary | ICD-10-CM | POA: Diagnosis not present

## 2023-03-28 DIAGNOSIS — N186 End stage renal disease: Secondary | ICD-10-CM | POA: Diagnosis not present

## 2023-03-28 DIAGNOSIS — R17 Unspecified jaundice: Secondary | ICD-10-CM | POA: Diagnosis not present

## 2023-03-28 DIAGNOSIS — T82594A Other mechanical complication of infusion catheter, initial encounter: Secondary | ICD-10-CM | POA: Diagnosis not present

## 2023-03-28 DIAGNOSIS — T8249XD Other complication of vascular dialysis catheter, subsequent encounter: Secondary | ICD-10-CM | POA: Diagnosis not present

## 2023-03-28 DIAGNOSIS — N2581 Secondary hyperparathyroidism of renal origin: Secondary | ICD-10-CM | POA: Diagnosis not present

## 2023-03-28 DIAGNOSIS — E875 Hyperkalemia: Secondary | ICD-10-CM | POA: Diagnosis not present

## 2023-03-30 DIAGNOSIS — I129 Hypertensive chronic kidney disease with stage 1 through stage 4 chronic kidney disease, or unspecified chronic kidney disease: Secondary | ICD-10-CM | POA: Diagnosis not present

## 2023-03-30 DIAGNOSIS — Z992 Dependence on renal dialysis: Secondary | ICD-10-CM | POA: Diagnosis not present

## 2023-03-30 DIAGNOSIS — N186 End stage renal disease: Secondary | ICD-10-CM | POA: Diagnosis not present

## 2023-03-31 DIAGNOSIS — N2581 Secondary hyperparathyroidism of renal origin: Secondary | ICD-10-CM | POA: Diagnosis not present

## 2023-03-31 DIAGNOSIS — D689 Coagulation defect, unspecified: Secondary | ICD-10-CM | POA: Diagnosis not present

## 2023-03-31 DIAGNOSIS — L299 Pruritus, unspecified: Secondary | ICD-10-CM | POA: Diagnosis not present

## 2023-03-31 DIAGNOSIS — E875 Hyperkalemia: Secondary | ICD-10-CM | POA: Diagnosis not present

## 2023-03-31 DIAGNOSIS — N186 End stage renal disease: Secondary | ICD-10-CM | POA: Diagnosis not present

## 2023-03-31 DIAGNOSIS — Z23 Encounter for immunization: Secondary | ICD-10-CM | POA: Diagnosis not present

## 2023-03-31 DIAGNOSIS — Z992 Dependence on renal dialysis: Secondary | ICD-10-CM | POA: Diagnosis not present

## 2023-03-31 NOTE — Telephone Encounter (Signed)
Spoke with patient and she informed me that she has an appointment with [facility] on April 4.

## 2023-04-02 DIAGNOSIS — N2581 Secondary hyperparathyroidism of renal origin: Secondary | ICD-10-CM | POA: Diagnosis not present

## 2023-04-02 DIAGNOSIS — Z23 Encounter for immunization: Secondary | ICD-10-CM | POA: Diagnosis not present

## 2023-04-02 DIAGNOSIS — Z992 Dependence on renal dialysis: Secondary | ICD-10-CM | POA: Diagnosis not present

## 2023-04-02 DIAGNOSIS — L299 Pruritus, unspecified: Secondary | ICD-10-CM | POA: Diagnosis not present

## 2023-04-02 DIAGNOSIS — D689 Coagulation defect, unspecified: Secondary | ICD-10-CM | POA: Diagnosis not present

## 2023-04-02 DIAGNOSIS — N186 End stage renal disease: Secondary | ICD-10-CM | POA: Diagnosis not present

## 2023-04-02 DIAGNOSIS — E875 Hyperkalemia: Secondary | ICD-10-CM | POA: Diagnosis not present

## 2023-04-03 DIAGNOSIS — Z992 Dependence on renal dialysis: Secondary | ICD-10-CM | POA: Diagnosis not present

## 2023-04-03 DIAGNOSIS — N186 End stage renal disease: Secondary | ICD-10-CM | POA: Diagnosis not present

## 2023-04-03 DIAGNOSIS — T8249XA Other complication of vascular dialysis catheter, initial encounter: Secondary | ICD-10-CM | POA: Diagnosis not present

## 2023-04-04 DIAGNOSIS — D689 Coagulation defect, unspecified: Secondary | ICD-10-CM | POA: Diagnosis not present

## 2023-04-04 DIAGNOSIS — Z992 Dependence on renal dialysis: Secondary | ICD-10-CM | POA: Diagnosis not present

## 2023-04-04 DIAGNOSIS — E875 Hyperkalemia: Secondary | ICD-10-CM | POA: Diagnosis not present

## 2023-04-04 DIAGNOSIS — N2581 Secondary hyperparathyroidism of renal origin: Secondary | ICD-10-CM | POA: Diagnosis not present

## 2023-04-04 DIAGNOSIS — N186 End stage renal disease: Secondary | ICD-10-CM | POA: Diagnosis not present

## 2023-04-04 DIAGNOSIS — L299 Pruritus, unspecified: Secondary | ICD-10-CM | POA: Diagnosis not present

## 2023-04-04 DIAGNOSIS — Z23 Encounter for immunization: Secondary | ICD-10-CM | POA: Diagnosis not present

## 2023-04-07 DIAGNOSIS — N186 End stage renal disease: Secondary | ICD-10-CM | POA: Diagnosis not present

## 2023-04-07 DIAGNOSIS — Z23 Encounter for immunization: Secondary | ICD-10-CM | POA: Diagnosis not present

## 2023-04-07 DIAGNOSIS — Z992 Dependence on renal dialysis: Secondary | ICD-10-CM | POA: Diagnosis not present

## 2023-04-07 DIAGNOSIS — E875 Hyperkalemia: Secondary | ICD-10-CM | POA: Diagnosis not present

## 2023-04-07 DIAGNOSIS — N2581 Secondary hyperparathyroidism of renal origin: Secondary | ICD-10-CM | POA: Diagnosis not present

## 2023-04-07 DIAGNOSIS — L299 Pruritus, unspecified: Secondary | ICD-10-CM | POA: Diagnosis not present

## 2023-04-07 DIAGNOSIS — D689 Coagulation defect, unspecified: Secondary | ICD-10-CM | POA: Diagnosis not present

## 2023-04-08 ENCOUNTER — Encounter (HOSPITAL_COMMUNITY): Payer: Self-pay

## 2023-04-08 ENCOUNTER — Emergency Department (HOSPITAL_COMMUNITY): Payer: 59

## 2023-04-08 ENCOUNTER — Emergency Department (HOSPITAL_COMMUNITY)
Admission: EM | Admit: 2023-04-08 | Discharge: 2023-04-09 | Disposition: A | Discharge status: Home / Self Care | Payer: 59 | Attending: Emergency Medicine | Admitting: Emergency Medicine

## 2023-04-08 ENCOUNTER — Ambulatory Visit: Payer: 59 | Admitting: Pulmonary Disease

## 2023-04-08 ENCOUNTER — Other Ambulatory Visit: Payer: Self-pay

## 2023-04-08 DIAGNOSIS — Z992 Dependence on renal dialysis: Secondary | ICD-10-CM | POA: Diagnosis not present

## 2023-04-08 DIAGNOSIS — I509 Heart failure, unspecified: Secondary | ICD-10-CM | POA: Insufficient documentation

## 2023-04-08 DIAGNOSIS — Z85038 Personal history of other malignant neoplasm of large intestine: Secondary | ICD-10-CM | POA: Insufficient documentation

## 2023-04-08 DIAGNOSIS — I132 Hypertensive heart and chronic kidney disease with heart failure and with stage 5 chronic kidney disease, or end stage renal disease: Secondary | ICD-10-CM | POA: Diagnosis not present

## 2023-04-08 DIAGNOSIS — I120 Hypertensive chronic kidney disease with stage 5 chronic kidney disease or end stage renal disease: Secondary | ICD-10-CM | POA: Diagnosis not present

## 2023-04-08 DIAGNOSIS — I2510 Atherosclerotic heart disease of native coronary artery without angina pectoris: Secondary | ICD-10-CM | POA: Diagnosis not present

## 2023-04-08 DIAGNOSIS — J449 Chronic obstructive pulmonary disease, unspecified: Secondary | ICD-10-CM | POA: Diagnosis not present

## 2023-04-08 DIAGNOSIS — N186 End stage renal disease: Secondary | ICD-10-CM | POA: Diagnosis not present

## 2023-04-08 DIAGNOSIS — R109 Unspecified abdominal pain: Secondary | ICD-10-CM | POA: Diagnosis not present

## 2023-04-08 DIAGNOSIS — R1011 Right upper quadrant pain: Secondary | ICD-10-CM | POA: Insufficient documentation

## 2023-04-08 DIAGNOSIS — N281 Cyst of kidney, acquired: Secondary | ICD-10-CM | POA: Diagnosis not present

## 2023-04-08 LAB — COMPREHENSIVE METABOLIC PANEL
ALT: 43 U/L (ref 0–44)
AST: 28 U/L (ref 15–41)
Albumin: 3.3 g/dL — ABNORMAL LOW (ref 3.5–5.0)
Alkaline Phosphatase: 110 U/L (ref 38–126)
Anion gap: 16 — ABNORMAL HIGH (ref 5–15)
BUN: 48 mg/dL — ABNORMAL HIGH (ref 8–23)
CO2: 19 mmol/L — ABNORMAL LOW (ref 22–32)
Calcium: 8.5 mg/dL — ABNORMAL LOW (ref 8.9–10.3)
Chloride: 100 mmol/L (ref 98–111)
Creatinine, Ser: 9.81 mg/dL — ABNORMAL HIGH (ref 0.44–1.00)
GFR, Estimated: 4 mL/min — ABNORMAL LOW (ref >60)
Glucose, Bld: 95 mg/dL (ref 70–99)
Potassium: 5.8 mmol/L — ABNORMAL HIGH (ref 3.5–5.1)
Sodium: 135 mmol/L (ref 135–145)
Total Bilirubin: 0.9 mg/dL (ref 0.3–1.2)
Total Protein: 7.4 g/dL (ref 6.5–8.1)

## 2023-04-08 LAB — CBC WITH DIFFERENTIAL/PLATELET
Abs Immature Granulocytes: 0.06 10*3/uL (ref 0.00–0.07)
Basophils Absolute: 0 10*3/uL (ref 0.0–0.1)
Basophils Relative: 0 %
Eosinophils Absolute: 0.4 10*3/uL (ref 0.0–0.5)
Eosinophils Relative: 3 %
HCT: 42.8 % (ref 36.0–46.0)
Hemoglobin: 13.5 g/dL (ref 12.0–15.0)
Immature Granulocytes: 0 %
Lymphocytes Relative: 14 %
Lymphs Abs: 2.1 10*3/uL (ref 0.7–4.0)
MCH: 29.9 pg (ref 26.0–34.0)
MCHC: 31.5 g/dL (ref 30.0–36.0)
MCV: 94.7 fL (ref 80.0–100.0)
Monocytes Absolute: 1 10*3/uL (ref 0.1–1.0)
Monocytes Relative: 7 %
Neutro Abs: 11 10*3/uL — ABNORMAL HIGH (ref 1.7–7.7)
Neutrophils Relative %: 76 %
Platelets: 162 10*3/uL (ref 150–400)
RBC: 4.52 MIL/uL (ref 3.87–5.11)
RDW: 13.5 % (ref 11.5–15.5)
WBC: 14.6 10*3/uL — ABNORMAL HIGH (ref 4.0–10.5)
nRBC: 0 % (ref 0.0–0.2)

## 2023-04-08 LAB — LIPASE, BLOOD: Lipase: 106 U/L — ABNORMAL HIGH (ref 11–51)

## 2023-04-08 MED ORDER — FENTANYL CITRATE PF 50 MCG/ML IJ SOSY
50.0000 ug | PREFILLED_SYRINGE | Freq: Once | INTRAMUSCULAR | Status: AC
  Administered 2023-04-09: 50 ug via INTRAVENOUS
  Filled 2023-04-08: qty 1

## 2023-04-08 MED ORDER — FUROSEMIDE 10 MG/ML IJ SOLN
40.0000 mg | Freq: Once | INTRAMUSCULAR | Status: AC
  Administered 2023-04-09: 40 mg via INTRAVENOUS
  Filled 2023-04-08: qty 4

## 2023-04-08 MED ORDER — ONDANSETRON HCL 4 MG/2ML IJ SOLN
4.0000 mg | Freq: Once | INTRAMUSCULAR | Status: AC
  Administered 2023-04-09: 4 mg via INTRAVENOUS
  Filled 2023-04-08: qty 2

## 2023-04-08 NOTE — ED Provider Triage Note (Signed)
Emergency Medicine Provider Triage Evaluation Note  Whitney Walker , a 66 y.o. female  was evaluated in triage.  Pt complains of right-sided flank pain.  Began last night and has progressively gotten worse.  There is some radiation to the right lower quadrant.  She is a dialysis patient.  She dialyzes Monday Wednesday and Friday.  Her pain began during dialysis yesterday.  She was able to finish most of her session.  She does not produce urine.  Denies fevers or chills.  Denies history of kidney stones.  Review of Systems  Positive: As above Negative: As above  Physical Exam  BP 122/72   Pulse 93   Temp 98.3 F (36.8 C)   Resp 18   Ht 5\' 2"  (1.575 m)   Wt 92.9 kg   SpO2 97%   BMI 37.46 kg/m  Gen:   Awake, moderate distress   Resp:  Normal effort  MSK:   Moves extremities without difficulty  Other:    Medical Decision Making  Medically screening exam initiated at 7:20 PM.  Appropriate orders placed.  Whitney Walker was informed that the remainder of the evaluation will be completed by another provider, this initial triage assessment does not replace that evaluation, and the importance of remaining in the ED until their evaluation is complete.  Workup initiated   Whitney Walker 04/08/23 1921

## 2023-04-08 NOTE — ED Notes (Signed)
Received call from [place] [facility] who states that they referred the pt to the ER for evaluation due to abdominal pain in combination with her prescribed medications. RN informed Angelique Blonder that pt is still in the waiting room at this time and requested a return call later to reevaluate.

## 2023-04-08 NOTE — ED Notes (Signed)
Patient transported to CT 

## 2023-04-08 NOTE — ED Triage Notes (Signed)
Pt c/o right sided abdominal pain that radiates to right flank and back area started yesterday. Pt dialysis pt Mon, Wed, Fri. Pt stated the pain started yesterday morning at dialysis and had to get off dialysis early. Pt states she received just about all of the dialysis except the last 12-14 mins. Pt also c/o nausea. Pt states no longer makes urine

## 2023-04-09 DIAGNOSIS — N2581 Secondary hyperparathyroidism of renal origin: Secondary | ICD-10-CM | POA: Diagnosis not present

## 2023-04-09 DIAGNOSIS — R1011 Right upper quadrant pain: Secondary | ICD-10-CM | POA: Diagnosis not present

## 2023-04-09 DIAGNOSIS — E875 Hyperkalemia: Secondary | ICD-10-CM | POA: Diagnosis not present

## 2023-04-09 DIAGNOSIS — Z23 Encounter for immunization: Secondary | ICD-10-CM | POA: Diagnosis not present

## 2023-04-09 DIAGNOSIS — N186 End stage renal disease: Secondary | ICD-10-CM | POA: Diagnosis not present

## 2023-04-09 DIAGNOSIS — L299 Pruritus, unspecified: Secondary | ICD-10-CM | POA: Diagnosis not present

## 2023-04-09 DIAGNOSIS — Z992 Dependence on renal dialysis: Secondary | ICD-10-CM | POA: Diagnosis not present

## 2023-04-09 DIAGNOSIS — D689 Coagulation defect, unspecified: Secondary | ICD-10-CM | POA: Diagnosis not present

## 2023-04-09 MED ORDER — HYDROCODONE-ACETAMINOPHEN 5-325 MG PO TABS: 1.0000 | ORAL_TABLET | Freq: Once | ORAL | Status: DC

## 2023-04-09 MED ORDER — ONDANSETRON HCL 4 MG/2ML IJ SOLN
4.0000 mg | Freq: Once | INTRAMUSCULAR | Status: AC
  Administered 2023-04-09: 4 mg via INTRAVENOUS
  Filled 2023-04-09: qty 2

## 2023-04-09 MED ORDER — HYDROMORPHONE HCL 1 MG/ML IJ SOLN
0.5000 mg | Freq: Once | INTRAMUSCULAR | Status: AC
  Administered 2023-04-09: 0.5 mg via INTRAVENOUS
  Filled 2023-04-09: qty 1

## 2023-04-09 MED ORDER — SODIUM ZIRCONIUM CYCLOSILICATE 5 G PO PACK
5.0000 g | PACK | Freq: Once | ORAL | Status: AC
  Administered 2023-04-09: 5 g via ORAL
  Filled 2023-04-09: qty 1

## 2023-04-09 NOTE — Discharge Instructions (Signed)
°  SEEK IMMEDIATE MEDICAL ATTENTION IF: °The pain does not go away or becomes severe, particularly over the next 8-12 hours.  °A temperature above 100.4F develops.  °Repeated vomiting occurs (multiple episodes).  ° °Blood is being passed in stools or vomit (bright red or black tarry stools).  °Return also if you develop chest pain, difficulty breathing, dizziness or fainting, or become confused, poorly responsive, or inconsolable. ° °

## 2023-04-09 NOTE — ED Provider Notes (Signed)
[place] EMERGENCY DEPARTMENT AT [facility] Provider Note   CSN: [id number] Arrival date & time: 04/08/23  1731     History  Chief Complaint  Patient presents with   Abdominal Pain    Whitney Walker is a 67 y.o. female.  The history is provided by the patient.  Patient with history of end-stage renal disease on dialysis, CAD, COPD presents with abdominal pain and flank pain Patient reports she was getting dialysis on April 8 when she started having pain in her right flank and abdominal burning.  She has had nausea and vomiting.  She has had normal bowel movements without blood or melena.  Her dialysis was slightly shortened due to pain.  No active chest pain at this time.  She takes Renagel for hyperphosphatemia and was concerned this was causing her symptoms so  she called poison control her told to go to the ER  She has had multiple abdominal surgeries including cholecystectomy She has no urine output at baseline  Past Medical History:  Diagnosis Date   Anemia    Anxiety    CHF (congestive heart failure)    Colon cancer    treatment surgery   Complication of anesthesia    after first [facility] [place] "couldnt walk after", patient denies having a spinal   COPD (chronic obstructive pulmonary disease)    Coronary artery disease    Depression    DVT (deep venous thrombosis)    ESRD on hemodialysis    Hemo: MWF   History of blood transfusion 04/2018   Hypertension    07/07/18- no longer takes BP medications   Meningitis    Pain in limb 07/30/2013   PE (pulmonary embolism)    Peripheral vascular disease    Primary hypertension 11/15/2013   Restless legs    Shortness of breath    with exertion   Sleep apnea    SOB (shortness of breath) 03/03/2019   Vertigo     Home Medications Prior to Admission medications   Medication Sig Start Date End Date Taking? Authorizing Provider  acetaminophen (TYLENOL) 325 MG tablet Take 650 mg by mouth daily as needed for  headache. 05/22/22   [provider]  acetaminophen (TYLENOL) 650 MG CR tablet Take 650 mg by mouth 3 (three) times daily as needed (arthritis).    [provider]  albuterol (VENTOLIN HFA) 108 (90 Base) MCG/ACT inhaler Inhale 2 puffs into the lungs every 6 (six) hours as needed for wheezing or shortness of breath. 09/19/22   Arnette Felts, FNP  Ascorbic Acid (VITAMIN C) 100 MG tablet Take 100 mg by mouth daily. Patient not taking: Reported on 03/20/2023    [provider]  atorvastatin (LIPITOR) 40 MG tablet Take 1 tablet (40 mg total) by mouth daily. 01/16/23   Yates Decamp, MD  AURYXIA 1 GM 210 MG(Fe) tablet Take 420-840 mg by mouth See admin instructions. Take 840mg  by mouth three times daily with meals and 420mg  with snacks. Once supply is used up, patient will switch to Sevelamer Patient not taking: Reported on 03/18/2023 05/15/21   [provider]  BIOTIN SL Place 10,000 mLs under the tongue daily. 05/22/22   [provider]  diclofenac Sodium (VOLTAREN) 1 % GEL Apply 2 g topically daily as needed (pain). 05/22/22   [provider]  Doxercalciferol (HECTOROL IV)  06/26/22 06/25/23  [provider]  ELIQUIS 5 MG TABS tablet TAKE ONE TABLET BY MOUTH TWICE DAILY 02/17/23   Christell Constant,  Lolita CramJanece, FNP  fludrocortisone (FLORINEF) 0.1 MG tablet Take 1 tablet (0.1 mg total) by mouth every other day. Take in evening prior to dialysis days Patient not taking: Reported on 03/20/2023 02/18/23   Yates DecampGanji, Jay, MD  fluticasone [facility](FLONASE) 50 MCG/ACT nasal spray Place 2 sprays into both nostrils daily. Patient taking differently: Place 2 sprays into both nostrils daily as needed for allergies. 05/30/22 05/30/23  Arnette FeltsMoore, Janece, FNP  fluticasone-salmeterol (ADVAIR HFA) [phone] MCG/ACT inhaler Inhale 2 puffs into the lungs 2 (two) times daily. 01/02/23   Arnette FeltsMoore, Janece, FNP  meclizine (ANTIVERT) 12.5 MG tablet Take 1 tablet (12.5 mg total) by mouth 3 (three) times daily as needed  for dizziness. 04/12/21   Dorothyann PengSanders, Robyn, MD  midodrine (PROAMATINE) 10 MG tablet Take 1 tablet (10 mg total) by mouth every other day. Take on the days of dialysis Patient not taking: Reported on 03/20/2023 01/16/23   Yates DecampGanji, Jay, MD  multivitamin (RENA-VIT) TABS tablet Take 1 tablet by mouth daily. Patient is taking 1 tablet every night.    [provider]  omeprazole (PRILOSEC) 20 MG capsule TAKE ONE CAPSULE BY MOUTH ONCE DAILY 03/17/23   Arnette FeltsMoore, Janece, FNP  sevelamer (RENAGEL) 800 MG tablet Take 800 mg by mouth 3 (three) times daily with meals. 3 tablets 3 times daily with meals, 2 tablets up to twice daily with snacks    [provider]  vitamin E 200 UNIT capsule Take 200 Units by mouth daily.    [provider]      Allergies    Carnosine, Gadolinium derivatives, Iohexol, Iodinated contrast media, Iodine, and Naltrexone    Review of Systems   Review of Systems  Constitutional:  Negative for fever.  Cardiovascular:  Negative for chest pain.  Gastrointestinal:  Positive for abdominal pain, nausea and vomiting.    Physical Exam Updated Vital Signs BP (!) 141/74   Pulse 94   Temp 98 F (36.7 C) (Oral)   Resp 13   Ht 1.575 m (5\' 2" )   Wt 92.9 kg   SpO2 97%   BMI 37.46 kg/m  Physical Exam CONSTITUTIONAL: Chronically ill-appearing HEAD: Normocephalic/atraumatic EYES: EOMI/PERRL ENMT: Mucous membranes moist NECK: supple no meningeal signs SPINE/BACK:entire spine nontender CV: S1/S2 noted, no murmurs/rubs/gallops noted LUNGS: Lungs are clear to auscultation bilaterally, no apparent distress ABDOMEN: soft, obese, right upper quadrant tenderness GU: Right cva tenderness NEURO: Pt is awake/alert/appropriate, moves all extremitiesx4.  No facial droop.   EXTREMITIES: pulses normal/equal, full ROM SKIN: warm, color normal, dialysis catheter to chest PSYCH: Mildly anxious ED Results / Procedures / Treatments   Labs (all labs ordered are listed, but only  abnormal results are displayed) Labs Reviewed  COMPREHENSIVE METABOLIC PANEL - Abnormal; Notable for the following components:      Result Value   Potassium 5.8 (*)    CO2 19 (*)    BUN 48 (*)    Creatinine, Ser 9.81 (*)    Calcium 8.5 (*)    Albumin 3.3 (*)    GFR, Estimated 4 (*)    Anion gap 16 (*)    All other components within normal limits  LIPASE, BLOOD - Abnormal; Notable for the following components:   Lipase 106 (*)    All other components within normal limits  CBC WITH DIFFERENTIAL/PLATELET - Abnormal; Notable for the following components:   WBC 14.6 (*)    Neutro Abs 11.0 (*)    All other components within normal limits    EKG EKG Interpretation  Date/Time:  Tuesday April 08 2023 23:35:34 EDT Ventricular Rate:  93 PR Interval:  193 QRS Duration: 78 QT Interval:  376 QTC Calculation: 468 R Axis:   246 Text Interpretation: Sinus rhythm LAD, consider left anterior fascicular block Low voltage, extremity leads Confirmed by Zadie Rhine ([id number]) on 04/08/2023 11:45:26 PM  Radiology CT Renal Stone Study  Result Date: 04/08/2023 CLINICAL DATA:  Right-sided abdominal pain for 2 days, initial encounter EXAM: CT ABDOMEN AND PELVIS WITHOUT CONTRAST TECHNIQUE: Multidetector CT imaging of the abdomen and pelvis was performed following the standard protocol without IV contrast. RADIATION DOSE REDUCTION: This exam was performed according to the departmental dose-optimization program which includes automated exposure control, adjustment of the mA and/or kV according to patient size and/or use of iterative reconstruction technique. COMPARISON:  07/16/2021 FINDINGS: Lower chest: No acute abnormality. Hepatobiliary: No focal liver abnormality is seen. Status post cholecystectomy. No biliary dilatation. Pancreas: Unremarkable. No pancreatic ductal dilatation or surrounding inflammatory changes. Spleen: Normal in size without focal abnormality. Adrenals/Urinary Tract: Adrenal glands are  within normal limits. Kidneys are shrunken consistent with the end-stage renal disease. Multiple simple appearing renal cysts are noted bilaterally. No follow-up is recommended. Renal vascular calcifications are seen. No obstructive changes are noted. The bladder is decompressed. Stomach/Bowel: Stomach and small bowel appear within normal limits. Changes of prior colectomy are noted. Vascular/Lymphatic: Aortic atherosclerosis. No enlarged abdominal or pelvic lymph nodes. Reproductive: Status post hysterectomy. No adnexal masses. Other: No abdominal wall hernia or abnormality. No abdominopelvic ascites. Musculoskeletal: No acute or significant osseous findings. IMPRESSION: No acute abnormality is noted to correspond with the given clinical history. Electronically Signed   By: Alcide Clever M.D.   On: 04/08/2023 22:58    Procedures Procedures    Medications Ordered in ED Medications  HYDROcodone-acetaminophen (NORCO/VICODIN) 5-325 MG per tablet 1 tablet (1 tablet Oral Not Given 04/09/23 0214)  fentaNYL (SUBLIMAZE) injection 50 mcg (50 mcg Intravenous Given 04/09/23 0024)  ondansetron (ZOFRAN) injection 4 mg (4 mg Intravenous Given 04/09/23 0024)  furosemide (LASIX) injection 40 mg (40 mg Intravenous Given 04/09/23 0024)  ondansetron (ZOFRAN) injection 4 mg (4 mg Intravenous Given 04/09/23 0213)  sodium zirconium cyclosilicate (LOKELMA) packet 5 g (5 g Oral Given 04/09/23 0213)  HYDROmorphone (DILAUDID) injection 0.5 mg (0.5 mg Intravenous Given 04/09/23 0236)    ED Course/ Medical Decision Making/ A&P Clinical Course as of 04/09/23 0341  Wed Apr 09, 2023  0100 Creatinine(!): 9.81 Chronic renal failure [DW]  0100 Potassium(!): 5.8 Mild hyperkalemia [DW]  0100 WBC(!): 14.6 Leukocytosis [DW]  0106 Patient presents with ongoing abdominal pain after recent dialysis session.  She thought it was due to her Renagel, she called poison center told to go to the ER to ensure she was having no reactions from this  medicine.  No obvious signs of bowel obstruction or perforation.  Will treat pain and reassess.  Patient does have mild hyperkalemia without EKG changes.  She is due for dialysis later today [DW]  0340 Patient monitored for several hours.  He is now improved.  No vomiting.  For her mild hyperkalemia she has been given Lokelma.  Lasix was also given.  She specifically requested Dilaudid for her abdominal pain.  One-time dose of this medication resolved her pain.  She is in no acute distress, awake and alert.  Overall she is well-appearing approved for discharge home.  No other signs of acute abdominal emergency. She plans to go home to follow-up with dialysis later today [DW]  Clinical Course User Index [DW] Zadie Rhine, MD                             Medical Decision Making Amount and/or Complexity of Data Reviewed Labs: ordered. Decision-making details documented in ED Course.  Risk Prescription drug management.   This patient presents to the ED for concern of abdominal pain, this involves an extensive number of treatment options, and is a complaint that carries with it a high risk of complications and morbidity.  The differential diagnosis includes but is not limited to cholecystitis, cholelithiasis, pancreatitis, gastritis, peptic ulcer disease, appendicitis, bowel obstruction, bowel perforation, diverticulitis, AAA, ischemic bowel    Comorbidities that complicate the patient evaluation: [name] presentation is complicated by their history of end stage renal disease   Additional history obtained: Records reviewed  cardiology notes reviewed  Lab Tests: I Ordered, and personally interpreted labs.  The pertinent results include: Hyperkalemia, end-stage renal disease  Imaging Studies ordered: I ordered imaging studies including CT scan renal   I independently visualized and interpreted imaging which showed no acute findings I agree with the radiologist  interpretation   Medicines ordered and prescription drug management: I ordered medication including fentanyl for pain Reevaluation of the patient after these medicines showed that the patient    improved  Reevaluation: After the interventions noted above, I reevaluated the patient and found that they have :improved  Complexity of problems addressed: [name] presentation is most consistent with  acute presentation with potential threat to life or bodily function  Disposition: After consideration of the diagnostic results and the patient's response to treatment,  I feel that the patent would benefit from discharge   .           Final Clinical Impression(s) / ED Diagnoses Final diagnoses:  Right upper quadrant abdominal pain  ESRD (end stage renal disease)    Rx / DC Orders ED Discharge Orders     None         Zadie Rhine, MD 04/09/23 [phone]

## 2023-04-11 ENCOUNTER — Telehealth: Payer: Self-pay

## 2023-04-11 DIAGNOSIS — N2581 Secondary hyperparathyroidism of renal origin: Secondary | ICD-10-CM | POA: Diagnosis not present

## 2023-04-11 DIAGNOSIS — Z23 Encounter for immunization: Secondary | ICD-10-CM | POA: Diagnosis not present

## 2023-04-11 DIAGNOSIS — N186 End stage renal disease: Secondary | ICD-10-CM | POA: Diagnosis not present

## 2023-04-11 DIAGNOSIS — Z992 Dependence on renal dialysis: Secondary | ICD-10-CM | POA: Diagnosis not present

## 2023-04-11 DIAGNOSIS — D689 Coagulation defect, unspecified: Secondary | ICD-10-CM | POA: Diagnosis not present

## 2023-04-11 DIAGNOSIS — E875 Hyperkalemia: Secondary | ICD-10-CM | POA: Diagnosis not present

## 2023-04-11 DIAGNOSIS — L299 Pruritus, unspecified: Secondary | ICD-10-CM | POA: Diagnosis not present

## 2023-04-11 NOTE — Transitions of Care (Post Inpatient/ED Visit) (Signed)
   04/11/2023  Name: Whitney Walker MRN: [id number] DOB: 07-17-1956  Today's TOC FU Call Status: Today's TOC FU Call Status:: Successful TOC FU Call Competed TOC FU Call Complete Date: 04/11/23  Transition Care Management Follow-up Telephone Call Date of Discharge: 04/08/23 Discharge Facility: [facility]) Type of Discharge: Inpatient Admission Primary Inpatient Discharge Diagnosis:: Right upper quadrant abdominal pain-esrd How have you been since you were released from the hospital?: Better Any questions or concerns?: No  Items Reviewed: Did you receive and understand the discharge instructions provided?: Yes Medications obtained and verified?: Yes (Medications Reviewed) Any new allergies since your discharge?: No Dietary orders reviewed?: No Do you have support at home?: Yes  Home Care and Equipment/Supplies: Were Home Health Services Ordered?: No Any new equipment or medical supplies ordered?: No  Functional Questionnaire: Do you need assistance with bathing/showering or dressing?: No Do you need assistance with meal preparation?: No Do you need assistance with eating?: No Do you have difficulty maintaining continence: No Do you need assistance with getting out of bed/getting out of a chair/moving?: No Do you have difficulty managing or taking your medications?: No  Follow up appointments reviewed: PCP Follow-up appointment confirmed?: No MD Provider Line Number:[phone] Given: Yes Specialist Hospital Follow-up appointment confirmed?: No Do you need transportation to your follow-up appointment?: No Do you understand care options if your condition(s) worsen?: Yes-patient verbalized understanding    SIGNATURE Lisabeth Devoid, CMA

## 2023-04-14 ENCOUNTER — Telehealth: Payer: Self-pay

## 2023-04-14 DIAGNOSIS — Z992 Dependence on renal dialysis: Secondary | ICD-10-CM | POA: Diagnosis not present

## 2023-04-14 DIAGNOSIS — N2581 Secondary hyperparathyroidism of renal origin: Secondary | ICD-10-CM | POA: Diagnosis not present

## 2023-04-14 DIAGNOSIS — D689 Coagulation defect, unspecified: Secondary | ICD-10-CM | POA: Diagnosis not present

## 2023-04-14 DIAGNOSIS — Z23 Encounter for immunization: Secondary | ICD-10-CM | POA: Diagnosis not present

## 2023-04-14 DIAGNOSIS — E875 Hyperkalemia: Secondary | ICD-10-CM | POA: Diagnosis not present

## 2023-04-14 DIAGNOSIS — N186 End stage renal disease: Secondary | ICD-10-CM | POA: Diagnosis not present

## 2023-04-14 DIAGNOSIS — L299 Pruritus, unspecified: Secondary | ICD-10-CM | POA: Diagnosis not present

## 2023-04-14 NOTE — Telephone Encounter (Signed)
        Patient  visited [place] on 4/10    Telephone encounter attempt :  1st  A HIPAA compliant voice message was left requesting a return call.  Instructed patient to call back .    [facility] Guide, [facility] [phone] [address] [place], [place], [place] [address] Phone: [phone] Email: [email or web address]

## 2023-04-15 ENCOUNTER — Telehealth: Payer: Self-pay

## 2023-04-15 NOTE — Telephone Encounter (Signed)
     Patient  visit on 4/10  at [facility]  Have you been able to follow up with your primary care physician? Yes   The patient was or was not able to obtain any needed medicine or equipment. Yes  Are there diet recommendations that you are having difficulty following? Na   Patient expresses understanding of discharge instructions and education provided has no other needs at this time.  Yes      Yoceline Bazar [facility] Guide, [place] [phone] [address], [place], [place] [address] Phone: [phone] Email: [email or web address]    

## 2023-04-16 DIAGNOSIS — L299 Pruritus, unspecified: Secondary | ICD-10-CM | POA: Diagnosis not present

## 2023-04-16 DIAGNOSIS — E875 Hyperkalemia: Secondary | ICD-10-CM | POA: Diagnosis not present

## 2023-04-16 DIAGNOSIS — N186 End stage renal disease: Secondary | ICD-10-CM | POA: Diagnosis not present

## 2023-04-16 DIAGNOSIS — Z992 Dependence on renal dialysis: Secondary | ICD-10-CM | POA: Diagnosis not present

## 2023-04-16 DIAGNOSIS — N2581 Secondary hyperparathyroidism of renal origin: Secondary | ICD-10-CM | POA: Diagnosis not present

## 2023-04-16 DIAGNOSIS — Z23 Encounter for immunization: Secondary | ICD-10-CM | POA: Diagnosis not present

## 2023-04-16 DIAGNOSIS — D689 Coagulation defect, unspecified: Secondary | ICD-10-CM | POA: Diagnosis not present

## 2023-04-17 ENCOUNTER — Telehealth: Payer: Self-pay

## 2023-04-17 ENCOUNTER — Encounter: Payer: Self-pay | Admitting: Nurse Practitioner

## 2023-04-17 ENCOUNTER — Ambulatory Visit (INDEPENDENT_AMBULATORY_CARE_PROVIDER_SITE_OTHER): Payer: 59 | Admitting: Nurse Practitioner

## 2023-04-17 VITALS — BP 98/50 | HR 108 | Temp 98.3°F | Ht 62.0 in | Wt 203.8 lb

## 2023-04-17 DIAGNOSIS — Z992 Dependence on renal dialysis: Secondary | ICD-10-CM

## 2023-04-17 DIAGNOSIS — Z87891 Personal history of nicotine dependence: Secondary | ICD-10-CM | POA: Diagnosis not present

## 2023-04-17 DIAGNOSIS — I132 Hypertensive heart and chronic kidney disease with heart failure and with stage 5 chronic kidney disease, or end stage renal disease: Secondary | ICD-10-CM

## 2023-04-17 DIAGNOSIS — N186 End stage renal disease: Secondary | ICD-10-CM | POA: Diagnosis not present

## 2023-04-17 DIAGNOSIS — Z6837 Body mass index (BMI) 37.0-37.9, adult: Secondary | ICD-10-CM

## 2023-04-17 DIAGNOSIS — M62830 Muscle spasm of back: Secondary | ICD-10-CM

## 2023-04-17 DIAGNOSIS — I5032 Chronic diastolic (congestive) heart failure: Secondary | ICD-10-CM

## 2023-04-17 NOTE — Patient Instructions (Signed)
Call us to let us know which muscle relaxer you can take from the dialysis center/nephrology

## 2023-04-17 NOTE — Progress Notes (Signed)
Care Management & Coordination Services Pharmacy Team  Reason for Encounter: Medication coordination and delivery  Contacted patient to discuss medications and coordinate delivery from [facility]. Unsuccessful outreach. Left voicemail for patient to return call. Cycle dispensing form sent to [facility]ille[facility] for review.   Last adherence delivery date:03-26-2023      Patient is due for next adherence delivery on: 04-29-2023  This delivery to include: Vials  30 Days  Eliquis 5 mg twice daily  Atorvastatin 40 mg daily  Patient declined the following medications this month: Unable to reach patient  No refill request needed.  Delivery scheduled for 04-29-2023. Unable to speak with patient to confirm date.    Chart review: Recent office visits:  03-20-2023 [name], LPN. Annual wellness visit.  03-18-2023 Little, Karma Lew, RN (CC).  Recent consult visits:  None  Hospital visits:  Medication Reconciliation was completed by comparing discharge summary, patient's EMR and Pharmacy list, and upon discussion with patient.  Admitted to the hospital on 04-08-2023 due to Abdominal pain. Discharge date was 04-09-2023. Discharged from [facility].    New?Medications Started at [facility] Discharge:?? None  Medication Changes at Hospital Discharge: None  Medications Discontinued at Hospital Discharge: None  Medications that remain the same after Hospital Discharge:??  -All other medications will remain the same.    Medications: Outpatient Encounter Medications as of 04/17/2023  Medication Sig Note   acetaminophen (TYLENOL) 325 MG tablet Take 650 mg by mouth daily as needed for headache.    acetaminophen (TYLENOL) 650 MG CR tablet Take 650 mg by mouth 3 (three) times daily as needed (arthritis).    albuterol (VENTOLIN HFA) 108 (90 Base) MCG/ACT inhaler Inhale 2 puffs into the lungs every 6 (six) hours as needed for wheezing or shortness of breath.    Ascorbic Acid  (VITAMIN C) 100 MG tablet Take 100 mg by mouth daily.    atorvastatin (LIPITOR) 40 MG tablet Take 1 tablet (40 mg total) by mouth daily.    BIOTIN SL Place 10,000 mLs under the tongue daily.    diclofenac Sodium (VOLTAREN) 1 % GEL Apply 2 g topically daily as needed (pain).    Doxercalciferol (HECTOROL IV)  12/06/2022: Given at dialysis   ELIQUIS 5 MG TABS tablet TAKE ONE TABLET BY MOUTH TWICE DAILY    fludrocortisone (FLORINEF) 0.1 MG tablet Take 1 tablet (0.1 mg total) by mouth every other day. Take in evening prior to dialysis days    fluticasone (FLONASE) 50 MCG/ACT nasal spray Place 2 sprays into both nostrils daily. (Patient taking differently: Place 2 sprays into both nostrils daily as needed for allergies.)    fluticasone-salmeterol (ADVAIR HFA) 115-21 MCG/ACT inhaler Inhale 2 puffs into the lungs 2 (two) times daily.    meclizine (ANTIVERT) 12.5 MG tablet Take 1 tablet (12.5 mg total) by mouth 3 (three) times daily as needed for dizziness.    midodrine (PROAMATINE) 10 MG tablet Take 1 tablet (10 mg total) by mouth every other day. Take on the days of dialysis    multivitamin (RENA-VIT) TABS tablet Take 1 tablet by mouth daily. Patient is taking 1 tablet every night.    omeprazole (PRILOSEC) 20 MG capsule TAKE ONE CAPSULE BY MOUTH ONCE DAILY    RENVELA 800 MG tablet Take 800 mg by mouth 3 (three) times daily with meals.    sevelamer (RENAGEL) 800 MG tablet Take 800 mg by mouth 3 (three) times daily with meals. 3 tablets 3 times daily with meals, 2  tablets up to twice daily with snacks    vitamin E 200 UNIT capsule Take 200 Units by mouth daily.    No facility-administered encounter medications on file as of 04/17/2023.   BP Readings from Last 3 Encounters:  04/09/23 (!) 141/74  03/20/23 (!) 108/56  02/18/23 130/80    Pulse Readings from Last 3 Encounters:  04/09/23 94  03/20/23 (!) 105  02/18/23 99    Lab Results  Component Value Date/Time   HGBA1C 5.9 (H) 11/15/2021 11:45 AM    HGBA1C 6.2 (H) 07/16/2021 02:02 PM   Lab Results  Component Value Date   CREATININE 9.81 (H) 04/08/2023   BUN 48 (H) 04/08/2023   GFRNONAA 4 (L) 04/08/2023   GFRAA 7 (L) 05/18/2020   NA 135 04/08/2023   K 5.8 (H) 04/08/2023   CALCIUM 8.5 (L) 04/08/2023   CO2 19 (L) 04/08/2023  04-17-2023: 1st attempt patient at appointment  04-18-2023: 2nd attempt left VM 04-21-2023: 3rd attempt left VM  [facility] CMA Clinical Pharmacist Assistant [phone]

## 2023-04-17 NOTE — Progress Notes (Addendum)
I,Sheena H Holbrook,acting as a Neurosurgeon for Arnette Felts, FNP.,have documented all relevant documentation on the behalf of Arnette Felts, FNP,as directed by  Arnette Felts, FNP while in the presence of Arnette Felts, FNP.    Subjective:     Patient ID: Whitney Walker , female    DOB: 02-07-1956 , 67 y.o.   MRN: [id number]   Chief Complaint  Patient presents with   Follow-up    ED 04/08/23    HPI  Patient presents today for ED follow up, 04/08/23. Patient complained of flank pain radiating around to her front part of abdomen, nothing acute found. She has been now taking renegal. She is able to eat better now, she did not want to take the binders that would possibly make her stomach hurt. She did talk to the dietician about her not taking the binders. She is taking the tylenol due to continuing right posterior back.   Initially when going in to the ED she had taken 4 tylenol then took 4 more. Pain is worse when getting in bed (sharp pain) first time has traveled around to her stomach. She reports when she drank water seems like the pain would improve. She has tried a pain pacth (lidocaine, biofreeze and voltaren gel.)  She has been referred to another psychologist at [facility].      Past Medical History:  Diagnosis Date   Anemia    Anxiety    CHF (congestive heart failure) (HCC)    Colon cancer (HCC)    treatment surgery   Complication of anesthesia    after first C- [place] "couldnt walk after", patient denies having a spinal   COPD (chronic obstructive pulmonary disease) (HCC)    Coronary artery disease    Depression    DVT (deep venous thrombosis) (HCC)    ESRD on hemodialysis (HCC)    Hemo: MWF   History of blood transfusion 04/2018   Hypertension    07/07/18- no longer takes BP medications   Meningitis    Pain in limb 07/30/2013   PE (pulmonary embolism)    Peripheral vascular disease (HCC)    Primary hypertension 11/15/2013   Restless legs    Shortness of  breath    with exertion   Sleep apnea    SOB (shortness of breath) 03/03/2019   Vertigo      Family History  Problem Relation Age of Onset   Cancer Mother 91       breast and bone   Cancer Father 60       prostate   Hypertension Sister    Bleeding Disorder Sister    Cancer Cousin 20       breast cancer    Hypertension Daughter    Breast cancer Neg Hx      Current Outpatient Medications:    acetaminophen (TYLENOL) 325 MG tablet, Take 650 mg by mouth daily as needed for headache., Disp: , Rfl:    acetaminophen (TYLENOL) 650 MG CR tablet, Take 650 mg by mouth 3 (three) times daily as needed (arthritis). (Patient not taking: Reported on 05/08/2023), Disp: , Rfl:    albuterol (VENTOLIN HFA) 108 (90 Base) MCG/ACT inhaler, Inhale 2 puffs into the lungs every 6 (six) hours as needed for wheezing or shortness of breath., Disp: 18 g, Rfl: 2   Ascorbic Acid (VITAMIN C) 100 MG tablet, Take 100 mg by mouth daily., Disp: , Rfl:    atorvastatin (LIPITOR) 40 MG tablet, Take 1 tablet (40 mg total)  by mouth daily., Disp: 90 tablet, Rfl: 3   BIOTIN SL, Place 10,000 mLs under the tongue daily., Disp: , Rfl:    diclofenac Sodium (VOLTAREN) 1 % GEL, Apply 2 g topically daily as needed (pain)., Disp: , Rfl:    Doxercalciferol (HECTOROL IV), , Disp: , Rfl:    fludrocortisone (FLORINEF) 0.1 MG tablet, Take 1 tablet (0.1 mg total) by mouth every other day. Take in evening prior to dialysis days, Disp: 90 tablet, Rfl: 1   fluticasone (FLONASE) 50 MCG/ACT nasal spray, Place 2 sprays into both nostrils daily. (Patient taking differently: Place 2 sprays into both nostrils daily as needed for allergies.), Disp: 18.2 mL, Rfl: 2   fluticasone-salmeterol (ADVAIR HFA) 115-21 MCG/ACT inhaler, Inhale 2 puffs into the lungs 2 (two) times daily., Disp: 1 each, Rfl: 12   meclizine (ANTIVERT) 12.5 MG tablet, Take 1 tablet (12.5 mg total) by mouth 3 (three) times daily as needed for dizziness., Disp: 30 tablet, Rfl: 0    multivitamin (RENA-VIT) TABS tablet, Take 1 tablet by mouth daily. Patient is taking 1 tablet every night., Disp: , Rfl:    omeprazole (PRILOSEC) 20 MG capsule, TAKE ONE CAPSULE BY MOUTH ONCE DAILY, Disp: 90 capsule, Rfl: 1   sevelamer (RENAGEL) 800 MG tablet, Take 800 mg by mouth 3 (three) times daily with meals. 3 tablets 3 times daily with meals, 2 tablets up to twice daily with snacks, Disp: , Rfl:    vitamin E 200 UNIT capsule, Take 200 Units by mouth daily., Disp: , Rfl:    cyclobenzaprine (FLEXERIL) 10 MG tablet, Take 1 tablet (10 mg total) by mouth at bedtime., Disp: 30 tablet, Rfl: 0   ELIQUIS 5 MG TABS tablet, TAKE ONE TABLET BY MOUTH TWICE DAILY, Disp: 60 tablet, Rfl: 2   Allergies  Allergen Reactions   Difelikefalin Itching   Carnosine     Other reaction(s): Unknown   Gadolinium Derivatives Hives and Other (See Comments)    HIVES, Desc: HIVES W/ "DYE" USED FOR 1ST CT SCAN BUT NOT 2ND, NO PREMEDS USED, PT UNCERTAIN OF CIRCUMSTANCES,,?POSSIBLE MRI CONTRAST ALLERGY, ALL STUDIES DONE "SOMEWHERE" IN [place]//A.C., Onset Date: [id number]   Iohexol Hives and Other (See Comments)    Desc: HIVES W/ "DYE" USED FOR 1ST CT SCAN BUT NOT 2ND, NO PREMEDS USED, PT UNCERTAIN OF CIRCUMSTANCES,,?POSSIBLE MRI CONTRAST ALLERGY, ALL STUDIES DONE "SOMEWHERE" IN [place]//A.C., Onset Date: [id number]    Iodinated Contrast Media Hives    1974   Iodine Hives    1974   Naltrexone Other (See Comments)    Unknown 1974      Review of Systems  Respiratory: Negative.    Cardiovascular: Negative.   Musculoskeletal: Negative.   Skin: Negative.   All other systems reviewed and are negative.    Today's Vitals   04/17/23 1430  BP: (!) 98/50  Pulse: (!) 108  Temp: 98.3 F (36.8 C)  TempSrc: Oral  SpO2: 95%  Weight: 203 lb 12.8 oz (92.4 kg)  Height: 5\' 2"  (1.575 m)  PainSc: 6   PainLoc: Abdomen   Body mass index is 37.28 kg/m.   Objective:  Physical Exam Vitals reviewed.   Constitutional:      General: She is not in acute distress.    Appearance: Normal appearance. She is obese.  Cardiovascular:     Rate and Rhythm: Normal rate and regular rhythm.     Pulses: Normal pulses.     Heart sounds: Normal heart sounds. No murmur heard. Pulmonary:  Effort: Pulmonary effort is normal. No respiratory distress.     Breath sounds: Normal breath sounds. No wheezing.  Musculoskeletal:        General: Tenderness (right back above flank area) present.  Skin:    General: Skin is warm and dry.     Capillary Refill: Capillary refill takes less than 2 seconds.     Findings: Bruising (left arm anterior) present.     Comments: Vas cath present  Neurological:     General: No focal deficit present.     Mental Status: She is alert and oriented to person, place, and time.     Cranial Nerves: No cranial nerve deficit.     Motor: No weakness.  Psychiatric:        Mood and Affect: Mood normal.        Behavior: Behavior normal.        Thought Content: Thought content normal.        Judgment: Judgment normal.         Assessment And Plan:     1. Muscle spasm of back Comments: She is to find out which muscle relaxer due to her ESRD, she is to ask Nephrology  2. ESRD (end stage renal disease) on dialysis (HCC) Continue hemodialysis  3. History of smoking 10-25 pack years Comments: Will order low dose CT scan due to history of smoking - CT CHEST LUNG CA SCREEN LOW DOSE W/O CM; Future  4. Morbid (severe) obesity due to excess calories (HCC) She is encouraged to strive for BMI less than 30 to decrease cardiac risk. Advised to aim for at least 150 minutes of exercise per week.  5. Chronic diastolic heart failure (HCC) Continue f/u with Cardiology   6. Malignant hypertension with heart failure and end-stage renal dis (HCC) Blood pressure is controlled, continue current medications   Patient was given opportunity to ask questions. Patient verbalized understanding of  the plan and was able to repeat key elements of the plan. All questions were answered to their satisfaction.  Arnette Felts, FNP   I, Arnette Felts, FNP, have reviewed all documentation for this visit. The documentation on 04/17/23 for the exam, diagnosis, procedures, and orders are all accurate and complete.   IF YOU HAVE BEEN REFERRED TO A SPECIALIST, IT MAY TAKE 1-2 WEEKS TO SCHEDULE/PROCESS THE REFERRAL. IF YOU HAVE NOT HEARD FROM US/SPECIALIST IN TWO WEEKS, PLEASE GIVE [place] A CALL AT [phone].   THE PATIENT IS ENCOURAGED TO PRACTICE SOCIAL DISTANCING DUE TO THE COVID-19 PANDEMIC.

## 2023-04-18 DIAGNOSIS — Z992 Dependence on renal dialysis: Secondary | ICD-10-CM | POA: Diagnosis not present

## 2023-04-18 DIAGNOSIS — Z23 Encounter for immunization: Secondary | ICD-10-CM | POA: Diagnosis not present

## 2023-04-18 DIAGNOSIS — E875 Hyperkalemia: Secondary | ICD-10-CM | POA: Diagnosis not present

## 2023-04-18 DIAGNOSIS — L299 Pruritus, unspecified: Secondary | ICD-10-CM | POA: Diagnosis not present

## 2023-04-18 DIAGNOSIS — D689 Coagulation defect, unspecified: Secondary | ICD-10-CM | POA: Diagnosis not present

## 2023-04-18 DIAGNOSIS — N2581 Secondary hyperparathyroidism of renal origin: Secondary | ICD-10-CM | POA: Diagnosis not present

## 2023-04-18 DIAGNOSIS — N186 End stage renal disease: Secondary | ICD-10-CM | POA: Diagnosis not present

## 2023-04-21 DIAGNOSIS — E875 Hyperkalemia: Secondary | ICD-10-CM | POA: Diagnosis not present

## 2023-04-21 DIAGNOSIS — N186 End stage renal disease: Secondary | ICD-10-CM | POA: Diagnosis not present

## 2023-04-21 DIAGNOSIS — Z992 Dependence on renal dialysis: Secondary | ICD-10-CM | POA: Diagnosis not present

## 2023-04-21 DIAGNOSIS — N2581 Secondary hyperparathyroidism of renal origin: Secondary | ICD-10-CM | POA: Diagnosis not present

## 2023-04-21 DIAGNOSIS — L299 Pruritus, unspecified: Secondary | ICD-10-CM | POA: Diagnosis not present

## 2023-04-21 DIAGNOSIS — D689 Coagulation defect, unspecified: Secondary | ICD-10-CM | POA: Diagnosis not present

## 2023-04-21 DIAGNOSIS — Z23 Encounter for immunization: Secondary | ICD-10-CM | POA: Diagnosis not present

## 2023-04-23 ENCOUNTER — Other Ambulatory Visit: Payer: Self-pay | Admitting: Nurse Practitioner

## 2023-04-23 DIAGNOSIS — E875 Hyperkalemia: Secondary | ICD-10-CM | POA: Diagnosis not present

## 2023-04-23 DIAGNOSIS — D689 Coagulation defect, unspecified: Secondary | ICD-10-CM | POA: Diagnosis not present

## 2023-04-23 DIAGNOSIS — L299 Pruritus, unspecified: Secondary | ICD-10-CM | POA: Diagnosis not present

## 2023-04-23 DIAGNOSIS — N186 End stage renal disease: Secondary | ICD-10-CM | POA: Diagnosis not present

## 2023-04-23 DIAGNOSIS — Z23 Encounter for immunization: Secondary | ICD-10-CM | POA: Diagnosis not present

## 2023-04-23 DIAGNOSIS — N2581 Secondary hyperparathyroidism of renal origin: Secondary | ICD-10-CM | POA: Diagnosis not present

## 2023-04-23 DIAGNOSIS — Z992 Dependence on renal dialysis: Secondary | ICD-10-CM | POA: Diagnosis not present

## 2023-04-23 MED ORDER — CYCLOBENZAPRINE HCL 10 MG PO TABS: 10.0000 mg | ORAL_TABLET | Freq: Every day | ORAL | 0 refills | Status: DC

## 2023-04-25 DIAGNOSIS — L299 Pruritus, unspecified: Secondary | ICD-10-CM | POA: Diagnosis not present

## 2023-04-25 DIAGNOSIS — D689 Coagulation defect, unspecified: Secondary | ICD-10-CM | POA: Diagnosis not present

## 2023-04-25 DIAGNOSIS — E875 Hyperkalemia: Secondary | ICD-10-CM | POA: Diagnosis not present

## 2023-04-25 DIAGNOSIS — Z992 Dependence on renal dialysis: Secondary | ICD-10-CM | POA: Diagnosis not present

## 2023-04-25 DIAGNOSIS — N2581 Secondary hyperparathyroidism of renal origin: Secondary | ICD-10-CM | POA: Diagnosis not present

## 2023-04-25 DIAGNOSIS — Z23 Encounter for immunization: Secondary | ICD-10-CM | POA: Diagnosis not present

## 2023-04-25 DIAGNOSIS — N186 End stage renal disease: Secondary | ICD-10-CM | POA: Diagnosis not present

## 2023-04-27 DIAGNOSIS — M62830 Muscle spasm of back: Secondary | ICD-10-CM | POA: Insufficient documentation

## 2023-04-28 DIAGNOSIS — L299 Pruritus, unspecified: Secondary | ICD-10-CM | POA: Diagnosis not present

## 2023-04-28 DIAGNOSIS — D689 Coagulation defect, unspecified: Secondary | ICD-10-CM | POA: Diagnosis not present

## 2023-04-28 DIAGNOSIS — E875 Hyperkalemia: Secondary | ICD-10-CM | POA: Diagnosis not present

## 2023-04-28 DIAGNOSIS — Z992 Dependence on renal dialysis: Secondary | ICD-10-CM | POA: Diagnosis not present

## 2023-04-28 DIAGNOSIS — N186 End stage renal disease: Secondary | ICD-10-CM | POA: Diagnosis not present

## 2023-04-28 DIAGNOSIS — Z23 Encounter for immunization: Secondary | ICD-10-CM | POA: Diagnosis not present

## 2023-04-28 DIAGNOSIS — N2581 Secondary hyperparathyroidism of renal origin: Secondary | ICD-10-CM | POA: Diagnosis not present

## 2023-04-29 ENCOUNTER — Ambulatory Visit: Payer: 59 | Admitting: Pulmonary Disease

## 2023-04-29 DIAGNOSIS — I129 Hypertensive chronic kidney disease with stage 1 through stage 4 chronic kidney disease, or unspecified chronic kidney disease: Secondary | ICD-10-CM | POA: Diagnosis not present

## 2023-04-29 DIAGNOSIS — N186 End stage renal disease: Secondary | ICD-10-CM | POA: Diagnosis not present

## 2023-04-29 DIAGNOSIS — Z992 Dependence on renal dialysis: Secondary | ICD-10-CM | POA: Diagnosis not present

## 2023-04-30 ENCOUNTER — Ambulatory Visit: Payer: Medicare Other | Admitting: Nurse Practitioner

## 2023-04-30 DIAGNOSIS — Z992 Dependence on renal dialysis: Secondary | ICD-10-CM | POA: Diagnosis not present

## 2023-04-30 DIAGNOSIS — Z23 Encounter for immunization: Secondary | ICD-10-CM | POA: Diagnosis not present

## 2023-04-30 DIAGNOSIS — L299 Pruritus, unspecified: Secondary | ICD-10-CM | POA: Diagnosis not present

## 2023-04-30 DIAGNOSIS — N2581 Secondary hyperparathyroidism of renal origin: Secondary | ICD-10-CM | POA: Diagnosis not present

## 2023-04-30 DIAGNOSIS — E875 Hyperkalemia: Secondary | ICD-10-CM | POA: Diagnosis not present

## 2023-04-30 DIAGNOSIS — N186 End stage renal disease: Secondary | ICD-10-CM | POA: Diagnosis not present

## 2023-04-30 DIAGNOSIS — D689 Coagulation defect, unspecified: Secondary | ICD-10-CM | POA: Diagnosis not present

## 2023-05-01 ENCOUNTER — Ambulatory Visit (INDEPENDENT_AMBULATORY_CARE_PROVIDER_SITE_OTHER): Payer: 59 | Admitting: Pulmonary Disease

## 2023-05-01 DIAGNOSIS — R0609 Other forms of dyspnea: Secondary | ICD-10-CM | POA: Diagnosis not present

## 2023-05-01 NOTE — Progress Notes (Signed)
Full PFT performed today. °

## 2023-05-01 NOTE — Patient Instructions (Signed)
Full PFT performed today. °

## 2023-05-02 DIAGNOSIS — D689 Coagulation defect, unspecified: Secondary | ICD-10-CM | POA: Diagnosis not present

## 2023-05-02 DIAGNOSIS — E875 Hyperkalemia: Secondary | ICD-10-CM | POA: Diagnosis not present

## 2023-05-02 DIAGNOSIS — N2581 Secondary hyperparathyroidism of renal origin: Secondary | ICD-10-CM | POA: Diagnosis not present

## 2023-05-02 DIAGNOSIS — N186 End stage renal disease: Secondary | ICD-10-CM | POA: Diagnosis not present

## 2023-05-02 DIAGNOSIS — Z992 Dependence on renal dialysis: Secondary | ICD-10-CM | POA: Diagnosis not present

## 2023-05-02 DIAGNOSIS — Z23 Encounter for immunization: Secondary | ICD-10-CM | POA: Diagnosis not present

## 2023-05-02 DIAGNOSIS — L299 Pruritus, unspecified: Secondary | ICD-10-CM | POA: Diagnosis not present

## 2023-05-05 DIAGNOSIS — L299 Pruritus, unspecified: Secondary | ICD-10-CM | POA: Diagnosis not present

## 2023-05-05 DIAGNOSIS — Z23 Encounter for immunization: Secondary | ICD-10-CM | POA: Diagnosis not present

## 2023-05-05 DIAGNOSIS — D689 Coagulation defect, unspecified: Secondary | ICD-10-CM | POA: Diagnosis not present

## 2023-05-05 DIAGNOSIS — E875 Hyperkalemia: Secondary | ICD-10-CM | POA: Diagnosis not present

## 2023-05-05 DIAGNOSIS — Z992 Dependence on renal dialysis: Secondary | ICD-10-CM | POA: Diagnosis not present

## 2023-05-05 DIAGNOSIS — N186 End stage renal disease: Secondary | ICD-10-CM | POA: Diagnosis not present

## 2023-05-05 DIAGNOSIS — N2581 Secondary hyperparathyroidism of renal origin: Secondary | ICD-10-CM | POA: Diagnosis not present

## 2023-05-07 DIAGNOSIS — N186 End stage renal disease: Secondary | ICD-10-CM | POA: Diagnosis not present

## 2023-05-07 DIAGNOSIS — D689 Coagulation defect, unspecified: Secondary | ICD-10-CM | POA: Diagnosis not present

## 2023-05-07 DIAGNOSIS — E875 Hyperkalemia: Secondary | ICD-10-CM | POA: Diagnosis not present

## 2023-05-07 DIAGNOSIS — Z23 Encounter for immunization: Secondary | ICD-10-CM | POA: Diagnosis not present

## 2023-05-07 DIAGNOSIS — Z992 Dependence on renal dialysis: Secondary | ICD-10-CM | POA: Diagnosis not present

## 2023-05-07 DIAGNOSIS — N2581 Secondary hyperparathyroidism of renal origin: Secondary | ICD-10-CM | POA: Diagnosis not present

## 2023-05-07 DIAGNOSIS — L299 Pruritus, unspecified: Secondary | ICD-10-CM | POA: Diagnosis not present

## 2023-05-08 ENCOUNTER — Encounter: Payer: Self-pay | Admitting: Pulmonary Disease

## 2023-05-08 ENCOUNTER — Ambulatory Visit (INDEPENDENT_AMBULATORY_CARE_PROVIDER_SITE_OTHER): Payer: 59 | Admitting: Pulmonary Disease

## 2023-05-08 VITALS — BP 112/78 | HR 104 | Temp 98.1°F | Ht 62.0 in | Wt 203.0 lb

## 2023-05-08 DIAGNOSIS — R0609 Other forms of dyspnea: Secondary | ICD-10-CM

## 2023-05-08 LAB — PULMONARY FUNCTION TEST
DL/VA % pred: 68 %
DL/VA: 2.91 ml/min/mmHg/L
DLCO cor % pred: 49 %
DLCO cor: 9.16 ml/min/mmHg
DLCO unc % pred: 50 %
DLCO unc: 9.19 ml/min/mmHg
FEF 25-75 Post: 1.32 L/sec
FEF 25-75 Pre: 1.46 L/sec
FEF2575-%Change-Post: -9 %
FEF2575-%Pred-Post: 68 %
FEF2575-%Pred-Pre: 75 %
FEV1-%Change-Post: 1 %
FEV1-%Pred-Post: 59 %
FEV1-%Pred-Pre: 59 %
FEV1-Post: 1.3 L
FEV1-Pre: 1.28 L
FEV1FVC-%Change-Post: 1 %
FEV1FVC-%Pred-Pre: 108 %
FEV6-%Change-Post: 2 %
FEV6-%Pred-Post: 56 %
FEV6-%Pred-Pre: 55 %
FEV6-Post: 1.54 L
FEV6-Pre: 1.51 L
FEV6FVC-%Pred-Post: 104 %
FEV6FVC-%Pred-Pre: 104 %
FVC-%Change-Post: 0 %
FVC-%Pred-Post: 53 %
FVC-%Pred-Pre: 54 %
FVC-Post: 1.54 L
FVC-Pre: 1.55 L
Post FEV1/FVC ratio: 84 %
Post FEV6/FVC ratio: 100 %
Pre FEV1/FVC ratio: 83 %
Pre FEV6/FVC Ratio: 100 %
RV % pred: 118 %
RV: 2.41 L
TLC % pred: 84 %
TLC: 4.04 L

## 2023-05-08 NOTE — Progress Notes (Signed)
@Patient  ID: Whitney Walker, female    DOB: Mar 23, 1956, 67 y.o.   MRN: [id number]  Chief Complaint  Patient presents with   Follow-up    PFT 05/01/2023 Breathing is not good    Referring provider: Arnette Felts, FNP  HPI:   67 y.o. woman whom we are seeing for evaluation of DOE. Most recent PCP note reviewed. Note from referring provider reviewed.   Continues with cough and dyspnea.  Continue Advair visit.  He continues to work.  Reviewed PFTs performed in interim.  These were largely normal but do demonstrate air trapping.  Discussed role and rationale for dual bronchodilator therapy.  She is amenable to trying.  Historically inhalers have not helped much.  We again reviewed her multiple causes for dyspnea largely being fluid accumulation between dialysis.   HPI initial visit:  Patient reports DOE for some time. Many years. Worse on inclines or stairs. Has tried inhalers such as Breo in the past - she does not think it helps much. Recently prescribed advair. No time of day when symptoms are better or worse. No seasonal or environmental factors that make things better or worse. No other alleviating or exacerbating factors. She notes DOE is better after HD and worsened the days between HD sessions. Notes severity of DOE worse when she drinks more water.  Most recent CT 07/2022 reveals mild emphysema and bronchial wall thickening on my review and interpretation. She has about 25 pack year smoking history.  She had spirometry in 2018 that is not consistent with COPD, but suggestive of air trapping vs restriction on my review and interpretation.  Questionaires / Pulmonary Flowsheets:   ACT:      No data to display           MMRC:     No data to display           Epworth:      No data to display           Tests:   FENO:  No results found for: "NITRICOXIDE"  PFT:    Latest Ref Rng & Units 05/01/2023    3:30 PM  PFT Results  FVC-Pre L 1.55  P   FVC-Predicted Pre % 54  P  FVC-Post L 1.54  P  FVC-Predicted Post % 53  P  Pre FEV1/FVC % % 83  P  Post FEV1/FCV % % 84  P  FEV1-Pre L 1.28  P  FEV1-Predicted Pre % 59  P  FEV1-Post L 1.30  P  DLCO uncorrected ml/min/mmHg 9.19  P  DLCO UNC% % 50  P  DLCO corrected ml/min/mmHg 9.16  P  DLCO COR %Predicted % 49  P  DLVA Predicted % 68  P  TLC L 4.04  P  TLC % Predicted % 84  P  RV % Predicted % 118  P    P Preliminary result   Personally reviewed interpreted spirometry just with moderate restriction versus air trapping, lung volumes consistent with air trapping.  DLCO moderately reduced.  WALK:     03/12/2017    3:33 PM  SIX MIN WALK  Tech Comments: fast walk, no complications/TA/CMA    Imaging: Personally reviewed and as per EMR CT Renal Stone Study  Result Date: 04/08/2023 CLINICAL DATA:  Right-sided abdominal pain for 2 days, initial encounter EXAM: CT ABDOMEN AND PELVIS WITHOUT CONTRAST TECHNIQUE: Multidetector CT imaging of the abdomen and pelvis was performed following the standard protocol without IV contrast. RADIATION  DOSE REDUCTION: This exam was performed according to the departmental dose-optimization program which includes automated exposure control, adjustment of the mA and/or kV according to patient size and/or use of iterative reconstruction technique. COMPARISON:  07/16/2021 FINDINGS: Lower chest: No acute abnormality. Hepatobiliary: No focal liver abnormality is seen. Status post cholecystectomy. No biliary dilatation. Pancreas: Unremarkable. No pancreatic ductal dilatation or surrounding inflammatory changes. Spleen: Normal in size without focal abnormality. Adrenals/Urinary Tract: Adrenal glands are within normal limits. Kidneys are shrunken consistent with the end-stage renal disease. Multiple simple appearing renal cysts are noted bilaterally. No follow-up is recommended. Renal vascular calcifications are seen. No obstructive changes are noted. The bladder is  decompressed. Stomach/Bowel: Stomach and small bowel appear within normal limits. Changes of prior colectomy are noted. Vascular/Lymphatic: Aortic atherosclerosis. No enlarged abdominal or pelvic lymph nodes. Reproductive: Status post hysterectomy. No adnexal masses. Other: No abdominal wall hernia or abnormality. No abdominopelvic ascites. Musculoskeletal: No acute or significant osseous findings. IMPRESSION: No acute abnormality is noted to correspond with the given clinical history. Electronically Signed   By: Alcide Clever M.D.   On: 04/08/2023 22:58    Lab Results: Personally reviewed CBC    Component Value Date/Time   WBC 14.6 (H) 04/08/2023 1843   RBC 4.52 04/08/2023 1843   HGB 13.5 04/08/2023 1843   HGB 10.3 (L) 05/30/2022 1604   HGB 10.9 (L) 07/07/2017 1235   HCT 42.8 04/08/2023 1843   HCT 31.8 (L) 05/30/2022 1604   HCT 35.0 07/07/2017 1235   PLT 162 04/08/2023 1843   PLT 285 05/30/2022 1604   MCV 94.7 04/08/2023 1843   MCV 94 05/30/2022 1604   MCV 85.2 07/07/2017 1235   MCH 29.9 04/08/2023 1843   MCHC 31.5 04/08/2023 1843   RDW 13.5 04/08/2023 1843   RDW 13.4 05/30/2022 1604   RDW 15.8 (H) 07/07/2017 1235   LYMPHSABS 2.1 04/08/2023 1843   LYMPHSABS 2.4 07/07/2017 1235   MONOABS 1.0 04/08/2023 1843   MONOABS 0.8 07/07/2017 1235   EOSABS 0.4 04/08/2023 1843   EOSABS 0.2 07/07/2017 1235   BASOSABS 0.0 04/08/2023 1843   BASOSABS 0.0 07/07/2017 1235    BMET    Component Value Date/Time   NA 135 04/08/2023 1843   NA 139 05/30/2022 1604   NA 138 07/07/2017 1235   K 5.8 (H) 04/08/2023 1843   K 4.5 07/07/2017 1235   CL 100 04/08/2023 1843   CL 111 (H) 12/04/2012 1056   CO2 19 (L) 04/08/2023 1843   CO2 17 (L) 07/07/2017 1235   GLUCOSE 95 04/08/2023 1843   GLUCOSE 85 07/07/2017 1235   GLUCOSE 97 12/04/2012 1056   BUN 48 (H) 04/08/2023 1843   BUN 30 (H) 05/30/2022 1604   BUN 40.8 (H) 07/07/2017 1235   CREATININE 9.81 (H) 04/08/2023 1843   CREATININE 3.4 (HH)  07/07/2017 1235   CALCIUM 8.5 (L) 04/08/2023 1843   CALCIUM 9.5 07/07/2017 1235   GFRNONAA 4 (L) 04/08/2023 1843   GFRAA 7 (L) 05/18/2020 1036    BNP    Component Value Date/Time   BNP 406.5 (H) 10/30/2020 0407    ProBNP    Component Value Date/Time   PROBNP 435.7 (H) 03/09/2014 1421    Specialty Problems       Pulmonary Problems   Dyspnea on exertion   OSA (obstructive sleep apnea)    Allergies  Allergen Reactions   Difelikefalin Itching   Carnosine     Other reaction(s): Unknown   Gadolinium Derivatives Hives  and Other (See Comments)    HIVES, Desc: HIVES W/ "DYE" USED FOR 1ST CT SCAN BUT NOT 2ND, NO PREMEDS USED, PT UNCERTAIN OF CIRCUMSTANCES,,?POSSIBLE MRI CONTRAST ALLERGY, ALL STUDIES DONE "SOMEWHERE" IN [place]//A.C., Onset Date: [id number]   Iohexol Hives and Other (See Comments)    Desc: HIVES W/ "DYE" USED FOR 1ST CT SCAN BUT NOT 2ND, NO PREMEDS USED, PT UNCERTAIN OF CIRCUMSTANCES,,?POSSIBLE MRI CONTRAST ALLERGY, ALL STUDIES DONE "SOMEWHERE" IN [place]//A.C., Onset Date: [id number]    Iodinated Contrast Media Hives    1974   Iodine Hives    1974   Naltrexone Other (See Comments)    Unknown 1974     Immunization History  Administered Date(s) Administered   Fluad Quad(high Dose 65+) 10/04/2019   Hepatitis B, ADULT 09/28/2018, 05/19/2020   Hepb-cpg 04/16/2023   Influenza, High Dose Seasonal PF 12/29/2020, 10/10/2021   Influenza, Quadrivalent, Recombinant, Inj, Pf 10/23/2022   Influenza,inj,Quad PF,6+ Mos 10/04/2019, 10/04/2019   Influenza,inj,quad, With Preservative 09/23/2018   Moderna Sars-Covid-2 Vaccination 03/10/2020, 04/07/2020, 12/22/2020   Pneumococcal Conjugate-13 04/20/2021   Pneumococcal Polysaccharide-23 03/22/2019    Past Medical History:  Diagnosis Date   Anemia    Anxiety    CHF (congestive heart failure) (HCC)    Colon cancer (HCC)    treatment surgery   Complication of anesthesia    after first [facility]- [place] "couldnt walk  after", patient denies having a spinal   COPD (chronic obstructive pulmonary disease) (HCC)    Coronary artery disease    Depression    DVT (deep venous thrombosis) (HCC)    ESRD on hemodialysis (HCC)    Hemo: MWF   History of blood transfusion 04/2018   Hypertension    07/07/18- no longer takes BP medications   Meningitis    Pain in limb 07/30/2013   PE (pulmonary embolism)    Peripheral vascular disease (HCC)    Primary hypertension 11/15/2013   Restless legs    Shortness of breath    with exertion   Sleep apnea    SOB (shortness of breath) 03/03/2019   Vertigo     Tobacco History: Social History   Tobacco Use  Smoking Status Former   Packs/day: 0.50   Years: 50.00   Additional pack years: 0.00   Total pack years: 25.00   Types: Cigarettes   Quit date: 10/13/2022   Years since quitting: 0.5  Smokeless Tobacco Former  Tobacco Comments   She is using a vape when she runs out of her cigarettes, 7/27 - down to 1-2 cigs a day   Counseling given: Not Answered Tobacco comments: She is using a vape when she runs out of her cigarettes, 7/27 - down to 1-2 cigs a day   Continue to not smoke  Outpatient Encounter Medications as of 05/08/2023  Medication Sig   acetaminophen (TYLENOL) 325 MG tablet Take 650 mg by mouth daily as needed for headache.   albuterol (VENTOLIN HFA) 108 (90 Base) MCG/ACT inhaler Inhale 2 puffs into the lungs every 6 (six) hours as needed for wheezing or shortness of breath.   Ascorbic Acid (VITAMIN C) 100 MG tablet Take 100 mg by mouth daily.   atorvastatin (LIPITOR) 40 MG tablet Take 1 tablet (40 mg total) by mouth daily.   BIOTIN SL Place 10,000 mLs under the tongue daily.   cyclobenzaprine (FLEXERIL) 10 MG tablet Take 1 tablet (10 mg total) by mouth at bedtime.   diclofenac Sodium (VOLTAREN) 1 % GEL Apply 2 g topically daily as  needed (pain).   Doxercalciferol (HECTOROL IV)    ELIQUIS 5 MG TABS tablet TAKE ONE TABLET BY MOUTH TWICE DAILY    fludrocortisone (FLORINEF) 0.1 MG tablet Take 1 tablet (0.1 mg total) by mouth every other day. Take in evening prior to dialysis days   fluticasone (FLONASE) 50 MCG/ACT nasal spray Place 2 sprays into both nostrils daily. (Patient taking differently: Place 2 sprays into both nostrils daily as needed for allergies.)   fluticasone-salmeterol (ADVAIR HFA) 115-21 MCG/ACT inhaler Inhale 2 puffs into the lungs 2 (two) times daily.   meclizine (ANTIVERT) 12.5 MG tablet Take 1 tablet (12.5 mg total) by mouth 3 (three) times daily as needed for dizziness.   multivitamin (RENA-VIT) TABS tablet Take 1 tablet by mouth daily. Patient is taking 1 tablet every night.   omeprazole (PRILOSEC) 20 MG capsule TAKE ONE CAPSULE BY MOUTH ONCE DAILY   vitamin E 200 UNIT capsule Take 200 Units by mouth daily.   [DISCONTINUED] midodrine (PROAMATINE) 10 MG tablet Take 1 tablet (10 mg total) by mouth every other day. Take on the days of dialysis   [DISCONTINUED] RENVELA 800 MG tablet Take 800 mg by mouth 3 (three) times daily with meals.   acetaminophen (TYLENOL) 650 MG CR tablet Take 650 mg by mouth 3 (three) times daily as needed (arthritis). (Patient not taking: Reported on 05/08/2023)   sevelamer (RENAGEL) 800 MG tablet Take 800 mg by mouth 3 (three) times daily with meals. 3 tablets 3 times daily with meals, 2 tablets up to twice daily with snacks   No facility-administered encounter medications on file as of 05/08/2023.     Review of Systems  Review of Systems  N/a Physical Exam  BP 112/78 (BP Location: Left Arm, Patient Position: Sitting, Cuff Size: Normal)   Pulse (!) 104   Temp 98.1 F (36.7 C) (Oral)   Ht 5\' 2"  (1.575 m)   Wt 203 lb (92.1 kg)   SpO2 96%   BMI 37.13 kg/m   Wt Readings from Last 5 Encounters:  05/08/23 203 lb (92.1 kg)  04/17/23 203 lb 12.8 oz (92.4 kg)  04/08/23 204 lb 12.9 oz (92.9 kg)  03/20/23 204 lb 12.8 oz (92.9 kg)  02/18/23 204 lb 9.6 oz (92.8 kg)    BMI Readings from Last 5  Encounters:  05/08/23 37.13 kg/m  04/17/23 37.28 kg/m  04/08/23 37.46 kg/m  03/20/23 37.46 kg/m  02/18/23 37.42 kg/m     Physical Exam General: Sitting in chair, in NAD Eyes: EOMI, no icterus Neck: supple, no JVP appreciated Pulmonary: clear, NWOB on RA CV: RRR, warm Abdomen, ND, BS present MSK: No synovitis, no joint effusion Neuro: Normal gait, no weakness Psych: normal mood, full affect   Assessment & Plan:   DOE: Primarily related to fluid accumulation between HD sessions as she reports improvement after HD and gradual worsening the next day between HD sessions as well as worsening when she is less judicious with fluid restriction. Prior spirometry not consistent with COPD. Inhalers have not helped in the past.  Repeat spirometry consistent with air trapping, decreased DLCO likely in setting of emphysema.  Stop Advair, trial Stiolto.  If helpful I can prescribe, samples provided today.   Return in about 6 months (around 11/08/2023).   Karren Burly, MD 05/08/2023

## 2023-05-08 NOTE — Patient Instructions (Addendum)
Nice to see you again  The lung function tests show signs of asthma and emphysema with air trapping   You Stiolto 2 puffs once a day every day see me a message if it is helping I can prescribe

## 2023-05-09 DIAGNOSIS — E875 Hyperkalemia: Secondary | ICD-10-CM | POA: Diagnosis not present

## 2023-05-09 DIAGNOSIS — N186 End stage renal disease: Secondary | ICD-10-CM | POA: Diagnosis not present

## 2023-05-09 DIAGNOSIS — Z23 Encounter for immunization: Secondary | ICD-10-CM | POA: Diagnosis not present

## 2023-05-09 DIAGNOSIS — Z992 Dependence on renal dialysis: Secondary | ICD-10-CM | POA: Diagnosis not present

## 2023-05-09 DIAGNOSIS — N2581 Secondary hyperparathyroidism of renal origin: Secondary | ICD-10-CM | POA: Diagnosis not present

## 2023-05-09 DIAGNOSIS — L299 Pruritus, unspecified: Secondary | ICD-10-CM | POA: Diagnosis not present

## 2023-05-09 DIAGNOSIS — D689 Coagulation defect, unspecified: Secondary | ICD-10-CM | POA: Diagnosis not present

## 2023-05-12 DIAGNOSIS — L299 Pruritus, unspecified: Secondary | ICD-10-CM | POA: Diagnosis not present

## 2023-05-12 DIAGNOSIS — Z23 Encounter for immunization: Secondary | ICD-10-CM | POA: Diagnosis not present

## 2023-05-12 DIAGNOSIS — D689 Coagulation defect, unspecified: Secondary | ICD-10-CM | POA: Diagnosis not present

## 2023-05-12 DIAGNOSIS — E875 Hyperkalemia: Secondary | ICD-10-CM | POA: Diagnosis not present

## 2023-05-12 DIAGNOSIS — N2581 Secondary hyperparathyroidism of renal origin: Secondary | ICD-10-CM | POA: Diagnosis not present

## 2023-05-12 DIAGNOSIS — Z992 Dependence on renal dialysis: Secondary | ICD-10-CM | POA: Diagnosis not present

## 2023-05-12 DIAGNOSIS — N186 End stage renal disease: Secondary | ICD-10-CM | POA: Diagnosis not present

## 2023-05-14 DIAGNOSIS — N2581 Secondary hyperparathyroidism of renal origin: Secondary | ICD-10-CM | POA: Diagnosis not present

## 2023-05-14 DIAGNOSIS — D689 Coagulation defect, unspecified: Secondary | ICD-10-CM | POA: Diagnosis not present

## 2023-05-14 DIAGNOSIS — E875 Hyperkalemia: Secondary | ICD-10-CM | POA: Diagnosis not present

## 2023-05-14 DIAGNOSIS — N186 End stage renal disease: Secondary | ICD-10-CM | POA: Diagnosis not present

## 2023-05-14 DIAGNOSIS — Z992 Dependence on renal dialysis: Secondary | ICD-10-CM | POA: Diagnosis not present

## 2023-05-14 DIAGNOSIS — Z23 Encounter for immunization: Secondary | ICD-10-CM | POA: Diagnosis not present

## 2023-05-14 DIAGNOSIS — L299 Pruritus, unspecified: Secondary | ICD-10-CM | POA: Diagnosis not present

## 2023-05-15 ENCOUNTER — Other Ambulatory Visit: Payer: Self-pay | Admitting: Nurse Practitioner

## 2023-05-16 DIAGNOSIS — N2581 Secondary hyperparathyroidism of renal origin: Secondary | ICD-10-CM | POA: Diagnosis not present

## 2023-05-16 DIAGNOSIS — E875 Hyperkalemia: Secondary | ICD-10-CM | POA: Diagnosis not present

## 2023-05-16 DIAGNOSIS — D689 Coagulation defect, unspecified: Secondary | ICD-10-CM | POA: Diagnosis not present

## 2023-05-16 DIAGNOSIS — Z23 Encounter for immunization: Secondary | ICD-10-CM | POA: Diagnosis not present

## 2023-05-16 DIAGNOSIS — N186 End stage renal disease: Secondary | ICD-10-CM | POA: Diagnosis not present

## 2023-05-16 DIAGNOSIS — L299 Pruritus, unspecified: Secondary | ICD-10-CM | POA: Diagnosis not present

## 2023-05-16 DIAGNOSIS — Z992 Dependence on renal dialysis: Secondary | ICD-10-CM | POA: Diagnosis not present

## 2023-05-19 ENCOUNTER — Telehealth: Payer: Self-pay

## 2023-05-19 DIAGNOSIS — Z992 Dependence on renal dialysis: Secondary | ICD-10-CM | POA: Diagnosis not present

## 2023-05-19 DIAGNOSIS — E875 Hyperkalemia: Secondary | ICD-10-CM | POA: Diagnosis not present

## 2023-05-19 DIAGNOSIS — L299 Pruritus, unspecified: Secondary | ICD-10-CM | POA: Diagnosis not present

## 2023-05-19 DIAGNOSIS — D689 Coagulation defect, unspecified: Secondary | ICD-10-CM | POA: Diagnosis not present

## 2023-05-19 DIAGNOSIS — Z23 Encounter for immunization: Secondary | ICD-10-CM | POA: Diagnosis not present

## 2023-05-19 DIAGNOSIS — N2581 Secondary hyperparathyroidism of renal origin: Secondary | ICD-10-CM | POA: Diagnosis not present

## 2023-05-19 DIAGNOSIS — N186 End stage renal disease: Secondary | ICD-10-CM | POA: Diagnosis not present

## 2023-05-19 NOTE — Progress Notes (Signed)
05-19-2023: Contacted patient to attempt medication delivery. Patient is not in need of any medications and will call [facility] when medications are needed. Spoke with Chasity at [facility] and Cherylin Mylar CPP about a solution since patient refuses medication deliveries last week. Per [place] and chasity patient can be unenrolled from ccm program so she doesn't have to be contacted but will call in when needed. Patient is aware that we will no longer contact her and to call in when needed. Pharmacy team updated and dispensing form sent back. Sent message to [employer] to unenroll patient.  Huey Romans [facility] Clinical Pharmacist Assistant [phone]

## 2023-05-20 ENCOUNTER — Ambulatory Visit: Payer: Self-pay

## 2023-05-20 ENCOUNTER — Ambulatory Visit
Admission: RE | Admit: 2023-05-20 | Discharge: 2023-05-20 | Disposition: A | Discharge status: Home / Self Care | Payer: 59 | Source: Ambulatory Visit | Attending: Nurse Practitioner | Admitting: Nurse Practitioner

## 2023-05-20 DIAGNOSIS — Z87891 Personal history of nicotine dependence: Secondary | ICD-10-CM

## 2023-05-20 NOTE — Patient Outreach (Signed)
  Care Coordination   Follow Up Visit Note   05/20/2023 Name: Kerma Dalberg MRN: [id number] DOB: 1956/11/21  Addlee Algeo is a 67 y.o. year old female who sees Arnette Felts, FNP for primary care. I spoke with  [name] by phone today.  What matters to the patients health and wellness today?  Patient would like to have less shortness of breath with activity.     Goals Addressed               This Visit's Progress     Patient Stated     I will consider seeing a Pulmonologist for my COPD (pt-stated)        Care Coordination Interventions: Evaluation of current treatment plan related to COPD and patient's adherence to plan as established by provider Discussed and reviewed with patient recent MD follow up with Pulmonology for evaluation of COPD Review of patient status, including review of consultant's reports, relevant laboratory and other test results, and medications completed DOE: Primarily related to fluid accumulation between HD sessions as she reports improvement after HD and gradual worsening the next day between HD sessions as well as worsening when she is less judicious with fluid restriction. Prior spirometry not consistent with COPD. Inhalers have not helped in the past.  Repeat spirometry consistent with air trapping, decreased DLCO likely in setting of emphysema.  Stop Advair, trial Stiolto.  If helpful I can prescribe, samples provided today. Return in about 6 months (around 11/08/2023). Determined patient verbalizes understanding of her prescribed treatment plan and MD recommendations    Interventions Today    Flowsheet Row Most Recent Value  Chronic Disease   Chronic disease during today's visit Chronic Kidney Disease/End Stage Renal Disease (ESRD), Other  [dyspnea,  muscle pain]  General Interventions   General Interventions Discussed/Reviewed General Interventions Discussed, General Interventions Reviewed, Labs, Doctor Visits  Doctor Visits  Discussed/Reviewed Doctor Visits Discussed, Doctor Visits Reviewed, PCP, Specialist  Education Interventions   Education Provided Provided Education  Provided Verbal Education On When to see the doctor, Labs          SDOH assessments and interventions completed:  No     Care Coordination Interventions:  Yes, provided   Follow up plan: Follow up call scheduled for 07/31/23 @10 :30 AM    Encounter Outcome:  Pt. Visit Completed

## 2023-05-20 NOTE — Patient Instructions (Signed)
Visit Information  Thank you for taking time to visit with me today. Please don't hesitate to contact me if I can be of assistance to you.   Following are the goals we discussed today:   Goals Addressed               This Visit's Progress     Patient Stated     I will consider seeing a Pulmonologist for my COPD (pt-stated)        Care Coordination Interventions: Evaluation of current treatment plan related to COPD and patient's adherence to plan as established by provider Discussed and reviewed with patient recent MD follow up with Pulmonology for evaluation of COPD Review of patient status, including review of consultant's reports, relevant laboratory and other test results, and medications completed DOE: Primarily related to fluid accumulation between HD sessions as she reports improvement after HD and gradual worsening the next day between HD sessions as well as worsening when she is less judicious with fluid restriction. Prior spirometry not consistent with COPD. Inhalers have not helped in the past.  Repeat spirometry consistent with air trapping, decreased DLCO likely in setting of emphysema.  Stop Advair, trial Stiolto.  If helpful I can prescribe, samples provided today. Return in about 6 months (around 11/08/2023). Determined patient verbalizes understanding of her prescribed treatment plan and MD recommendations         Our next appointment is by telephone on 07/31/23 at 10:30 AM  Please call the care guide team at [phone] if you need to cancel or reschedule your appointment.   If you are experiencing a Mental Health or Behavioral Health Crisis or need someone to talk to, please call 1-800-273-TALK (toll free, 24 hour hotline) go to [facility] [address] [place] [phone])  Patient verbalizes understanding of instructions and care plan provided today and agrees to view in MyChart. Active MyChart status and patient  understanding of how to access instructions and care plan via MyChart confirmed with patient.     Delsa Sale, RN, BSN, CCM Care Management Coordinator [facility] Care Management  Direct Phone: [phone]

## 2023-05-21 ENCOUNTER — Other Ambulatory Visit: Payer: Self-pay

## 2023-05-21 DIAGNOSIS — L299 Pruritus, unspecified: Secondary | ICD-10-CM | POA: Diagnosis not present

## 2023-05-21 DIAGNOSIS — N186 End stage renal disease: Secondary | ICD-10-CM | POA: Diagnosis not present

## 2023-05-21 DIAGNOSIS — I950 Idiopathic hypotension: Secondary | ICD-10-CM

## 2023-05-21 DIAGNOSIS — Z992 Dependence on renal dialysis: Secondary | ICD-10-CM | POA: Diagnosis not present

## 2023-05-21 DIAGNOSIS — D631 Anemia in chronic kidney disease: Secondary | ICD-10-CM

## 2023-05-21 DIAGNOSIS — Z23 Encounter for immunization: Secondary | ICD-10-CM | POA: Diagnosis not present

## 2023-05-21 DIAGNOSIS — N2581 Secondary hyperparathyroidism of renal origin: Secondary | ICD-10-CM | POA: Diagnosis not present

## 2023-05-21 DIAGNOSIS — E875 Hyperkalemia: Secondary | ICD-10-CM | POA: Diagnosis not present

## 2023-05-21 DIAGNOSIS — D689 Coagulation defect, unspecified: Secondary | ICD-10-CM | POA: Diagnosis not present

## 2023-05-22 ENCOUNTER — Telehealth: Payer: Self-pay

## 2023-05-22 ENCOUNTER — Inpatient Hospital Stay: Payer: 59 | Attending: Hematology | Admitting: Hematology

## 2023-05-22 ENCOUNTER — Inpatient Hospital Stay: Payer: 59

## 2023-05-22 ENCOUNTER — Encounter: Payer: Self-pay | Admitting: Hematology

## 2023-05-22 ENCOUNTER — Other Ambulatory Visit: Payer: Self-pay

## 2023-05-22 VITALS — BP 130/58 | HR 105 | Temp 98.7°F | Resp 16 | Wt 204.3 lb

## 2023-05-22 DIAGNOSIS — I82412 Acute embolism and thrombosis of left femoral vein: Secondary | ICD-10-CM | POA: Diagnosis not present

## 2023-05-22 DIAGNOSIS — D631 Anemia in chronic kidney disease: Secondary | ICD-10-CM | POA: Diagnosis not present

## 2023-05-22 DIAGNOSIS — N183 Chronic kidney disease, stage 3 unspecified: Secondary | ICD-10-CM

## 2023-05-22 DIAGNOSIS — I950 Idiopathic hypotension: Secondary | ICD-10-CM

## 2023-05-22 DIAGNOSIS — Z86718 Personal history of other venous thrombosis and embolism: Secondary | ICD-10-CM | POA: Insufficient documentation

## 2023-05-22 DIAGNOSIS — D649 Anemia, unspecified: Secondary | ICD-10-CM | POA: Insufficient documentation

## 2023-05-22 DIAGNOSIS — Z992 Dependence on renal dialysis: Secondary | ICD-10-CM | POA: Diagnosis not present

## 2023-05-22 DIAGNOSIS — N186 End stage renal disease: Secondary | ICD-10-CM | POA: Insufficient documentation

## 2023-05-22 LAB — CMP (CANCER CENTER ONLY)
ALT: 34 U/L (ref 0–44)
AST: 34 U/L (ref 15–41)
Albumin: 3.5 g/dL (ref 3.5–5.0)
Alkaline Phosphatase: 127 U/L — ABNORMAL HIGH (ref 38–126)
Anion gap: 8 (ref 5–15)
BUN: 25 mg/dL — ABNORMAL HIGH (ref 8–23)
CO2: 28 mmol/L (ref 22–32)
Calcium: 9 mg/dL (ref 8.9–10.3)
Chloride: 101 mmol/L (ref 98–111)
Creatinine: 7.43 mg/dL (ref 0.44–1.00)
GFR, Estimated: 6 mL/min — ABNORMAL LOW (ref >60)
Glucose, Bld: 125 mg/dL — ABNORMAL HIGH (ref 70–99)
Potassium: 4.2 mmol/L (ref 3.5–5.1)
Sodium: 137 mmol/L (ref 135–145)
Total Bilirubin: 0.5 mg/dL (ref 0.3–1.2)
Total Protein: 7.2 g/dL (ref 6.5–8.1)

## 2023-05-22 LAB — CBC WITH DIFFERENTIAL (CANCER CENTER ONLY)
Abs Immature Granulocytes: 0.04 10*3/uL (ref 0.00–0.07)
Basophils Absolute: 0.1 10*3/uL (ref 0.0–0.1)
Basophils Relative: 1 %
Eosinophils Absolute: 0.3 10*3/uL (ref 0.0–0.5)
Eosinophils Relative: 3 %
HCT: 39.7 % (ref 36.0–46.0)
Hemoglobin: 13 g/dL (ref 12.0–15.0)
Immature Granulocytes: 0 %
Lymphocytes Relative: 19 %
Lymphs Abs: 2 10*3/uL (ref 0.7–4.0)
MCH: 30.6 pg (ref 26.0–34.0)
MCHC: 32.7 g/dL (ref 30.0–36.0)
MCV: 93.4 fL (ref 80.0–100.0)
Monocytes Absolute: 1 10*3/uL (ref 0.1–1.0)
Monocytes Relative: 9 %
Neutro Abs: 7.1 10*3/uL (ref 1.7–7.7)
Neutrophils Relative %: 68 %
Platelet Count: 175 10*3/uL (ref 150–400)
RBC: 4.25 MIL/uL (ref 3.87–5.11)
RDW: 14.5 % (ref 11.5–15.5)
WBC Count: 10.4 10*3/uL (ref 4.0–10.5)
nRBC: 0 % (ref 0.0–0.2)

## 2023-05-22 NOTE — Progress Notes (Signed)
[facility]   [phone] [phone]   Clinic Follow up Note   Patient Care Team: Arnette Felts, FNP as PCP - General (General Practice) Malachy Mood, MD as Consulting Physician (Hematology) [facility], [facility], Karma Lew, RN as [facility] Care Management Yates Decamp, MD as Consulting Physician (Cardiology) Darnell Level, MD as Consulting Physician (Internal Medicine)  Date of Service:  05/22/2023  CHIEF COMPLAINT: f/u of anemia, h/o thrombosis   CURRENT THERAPY:  Eliquis 5 mg bid (dose per renal function)   ASSESSMENT:  Whitney Walker is a 67 y.o. female with   DVT (deep venous thrombosis) (HCC) -She previously had episodes of PE, was treated with Xarelto. 10 months after she came off Xarelto, she developed unprovoked DVT and PE. -I previously recommended her to continue anticoagulation indefinitely, if no contraindications. -Patient was previously on Coumadin, but switched to Eliquis due to her transportation issues with Coumadin check up. She takes Eliquis 2.5mg  BID, dose adjusted based on her ESRD on HD. -She previously stated she has been given high dose hepatin during dialysis due to clotting issue, which is fine.  -Continue Eliquis 2.5mg  BID and continue to watch for blood clot.   Anemia of chronic disease and anemia of iron deficiency -likely related to her chronic kidney disease  -She takes oral iron supplement and IV iron as needed which is managed by her nephrologist at dialysis center.  -anemia resolved lately     ESRD on HD -She underwent fistula surgery with Dr. Randie Heinz on 05/05/18 and she also started dialysis in 04/2018, currently [facility].  -She previously had borderline hypotension on dialysis, she is off blood pressure medicine now.  Overall she is tolerating dialysis better.   OSA, PVD, CHF, acid reflux -She is s/p heart cath 05/01/21 and again 05/02/22 for atypical chest pain -F/u with  PCP and other specialists.    PLAN: - lab reviewed anemia has resolved - Continue Eliquis 5 mg prescribe by Erasmo Score -f/u in 1 year per pt's preference    INTERVAL HISTORY:  Whitney Walker is here for a follow up of anemia, h/o thrombosis. She was last seen by me on 05/16/2022. She presents to the clinic Pt state that the last two weeks have been going great. Pt state that she is still taking her Eliquis.   All other systems were reviewed with the patient and are negative.  MEDICAL HISTORY:  Past Medical History:  Diagnosis Date   Anemia    Anxiety    CHF (congestive heart failure) (HCC)    Colon cancer (HCC)    treatment surgery   Complication of anesthesia    after first [facility]- [place] "couldnt walk after", patient denies having a spinal   COPD (chronic obstructive pulmonary disease) (HCC)    Coronary artery disease    Depression    DVT (deep venous thrombosis) (HCC)    ESRD on hemodialysis (HCC)    Hemo: MWF   History of blood transfusion 04/2018   Hypertension    07/07/18- no longer takes BP medications   Meningitis    Pain in limb 07/30/2013   PE (pulmonary embolism)    Peripheral vascular disease (HCC)    Primary hypertension 11/15/2013   Restless legs    Shortness of breath    with exertion   Sleep apnea    SOB (shortness of breath) 03/03/2019   Vertigo     SURGICAL HISTORY: Past Surgical History:  Procedure Laterality Date   ABDOMINAL AORTAGRAM N/A 07/26/2013   Procedure: ABDOMINAL AORTAGRAM;  Surgeon: Fransisco Hertz, MD;  Location: [facility] CATH LAB;  Service: Cardiovascular;  Laterality: N/A;   ABDOMINAL HYSTERECTOMY     AV FISTULA PLACEMENT Left 05/05/2018   Procedure: ARTERIOVENOUS (AV) FISTULA CREATION BRACHIOCEPHALIC;  Surgeon: Maeola Harman, MD;  Location: [facility] OR;  Service: Vascular;  Laterality: Left;   AV FISTULA PLACEMENT Left 07/16/2018   Procedure: ARTERIOVENOUS FISTULA CREATION;  Surgeon: Maeola Harman, MD;  Location: [facility]  OR;  Service: Vascular;  Laterality: Left;   BASCILIC VEIN TRANSPOSITION Left 09/03/2018   Procedure: BASILIC VEIN TRANSPOSITION SECOND STAGE;  Surgeon: Maeola Harman, MD;  Location: [facility] OR;  Service: Vascular;  Laterality: Left;   BREAST BIOPSY Left    CESAREAN SECTION     X 3 1974-1977   CHOLECYSTECTOMY     CHOLECYSTECTOMY  1980/s   COLECTOMY  2010   DIVERTICULOSIS SURGERY-2002  2012   FEMORAL-POPLITEAL BYPASS GRAFT Left 08/13/2013   Procedure: BYPASS GRAFT FEMORAL-POPLITEAL ARTERY WITH NON-REVERSED SAPHANEOUS VEIN; ULTRASOUND GUIDED;  Surgeon: Pryor Ochoa, MD;  Location: [facility] OR;  Service: Vascular;  Laterality: Left;   FLEXIBLE SIGMOIDOSCOPY N/A 12/10/2022   Procedure: FLEXIBLE SIGMOIDOSCOPY;  Surgeon: Jeani Hawking, MD;  Location: [facility];  Service: Gastroenterology;  Laterality: N/A;   INSERTION OF DIALYSIS CATHETER Right 04/30/2018   Procedure: INSERTION OF Right Internal Jugular DIALYSIS CATHETER;  Surgeon: Nada Libman, MD;  Location: [facility] OR;  Service: Vascular;  Laterality: Right;   IR FLUORO GUIDE CV LINE LEFT  05/01/2022   IR FLUORO GUIDE CV LINE LEFT  05/21/2022   IR FLUORO GUIDE CV LINE RIGHT  07/24/2020   IR FLUORO GUIDE CV LINE RIGHT  07/21/2021   IR FLUORO GUIDE CV LINE RIGHT  05/20/2022   IR PTA VENOUS EXCEPT DIALYSIS CIRCUIT  05/21/2022   IR REMOVAL TUN CV CATH W/O FL  07/21/2020   IR REMOVAL TUN CV CATH W/O FL  07/19/2021   IR REMOVAL TUN CV CATH W/O FL  05/17/2022   IR US GUIDE VASC ACCESS LEFT  05/21/2022   IR US GUIDE VASC ACCESS RIGHT  07/24/2020   IR US GUIDE VASC ACCESS RIGHT  07/21/2021   IR US GUIDE VASC ACCESS RIGHT  05/20/2022   IR VENOCAVAGRAM SVC  05/21/2022   LEFT HEART CATH AND CORONARY ANGIOGRAPHY N/A 05/01/2021   Procedure: LEFT HEART CATH AND CORONARY ANGIOGRAPHY;  Surgeon: Yates Decamp, MD;  Location: [facility] INVASIVE CV LAB;  Service: Cardiovascular;  Laterality: N/A;   LEFT HEART CATH AND CORONARY ANGIOGRAPHY N/A 05/02/2022   Procedure: LEFT HEART CATH  AND CORONARY ANGIOGRAPHY;  Surgeon: Yates Decamp, MD;  Location: [facility] INVASIVE CV LAB;  Service: Cardiovascular;  Laterality: N/A;   LOWER EXTREMITY ANGIOGRAM Left 07/26/2013   Procedure: LOWER EXTREMITY ANGIOGRAM;  Surgeon: Fransisco Hertz, MD;  Location: [facility] CATH LAB;  Service: Cardiovascular;  Laterality: Left;    I have reviewed the social history and family history with the patient and they are unchanged from previous note.  ALLERGIES:  is allergic to difelikefalin, carnosine, gadolinium derivatives, iohexol, iodinated contrast media, iodine, and naltrexone.  MEDICATIONS:  Current Outpatient Medications  Medication Sig Dispense Refill   acetaminophen (TYLENOL) 325 MG tablet Take 650 mg by mouth daily as needed for headache.     acetaminophen (TYLENOL) 650 MG CR tablet Take 650 mg by mouth 3 (three) times daily as needed (arthritis). (Patient not taking: Reported on  05/08/2023)     albuterol (VENTOLIN HFA) 108 (90 Base) MCG/ACT inhaler Inhale 2 puffs into the lungs every 6 (six) hours as needed for wheezing or shortness of breath. 18 g 2   Ascorbic Acid (VITAMIN C) 100 MG tablet Take 100 mg by mouth daily.     atorvastatin (LIPITOR) 40 MG tablet Take 1 tablet (40 mg total) by mouth daily. 90 tablet 3   BIOTIN SL Place 10,000 mLs under the tongue daily.     cyclobenzaprine (FLEXERIL) 10 MG tablet Take 1 tablet (10 mg total) by mouth at bedtime. 30 tablet 0   diclofenac Sodium (VOLTAREN) 1 % GEL Apply 2 g topically daily as needed (pain).     Doxercalciferol (HECTOROL IV)      ELIQUIS 5 MG TABS tablet TAKE ONE TABLET BY MOUTH TWICE DAILY 60 tablet 2   fludrocortisone (FLORINEF) 0.1 MG tablet Take 1 tablet (0.1 mg total) by mouth every other day. Take in evening prior to dialysis days 90 tablet 1   fluticasone (FLONASE) 50 MCG/ACT nasal spray Place 2 sprays into both nostrils daily. (Patient taking differently: Place 2 sprays into both nostrils daily as needed for allergies.) 18.2 mL 2    fluticasone-salmeterol (ADVAIR HFA) 115-21 MCG/ACT inhaler Inhale 2 puffs into the lungs 2 (two) times daily. 1 each 12   meclizine (ANTIVERT) 12.5 MG tablet Take 1 tablet (12.5 mg total) by mouth 3 (three) times daily as needed for dizziness. 30 tablet 0   multivitamin (RENA-VIT) TABS tablet Take 1 tablet by mouth daily. Patient is taking 1 tablet every night.     omeprazole (PRILOSEC) 20 MG capsule TAKE ONE CAPSULE BY MOUTH ONCE DAILY 90 capsule 1   vitamin E 200 UNIT capsule Take 200 Units by mouth daily.     No current facility-administered medications for this visit.    PHYSICAL EXAMINATION: ECOG PERFORMANCE STATUS: 1 - Symptomatic but completely ambulatory  Vitals:   05/22/23 1152  BP: (!) 130/58  Pulse: (!) 105  Resp: 16  Temp: 98.7 F (37.1 C)   Wt Readings from Last 3 Encounters:  05/22/23 204 lb 4.8 oz (92.7 kg)  05/08/23 203 lb (92.1 kg)  04/17/23 203 lb 12.8 oz (92.4 kg)     GENERAL:alert, no distress and comfortable SKIN: skin color normal, no rashes or significant lesions EYES: normal, Conjunctiva are pink and non-injected, sclera clear  NEURO: alert & oriented x 3 with fluent speech  LABORATORY DATA:  I have reviewed the data as listed    Latest Ref Rng & Units 05/22/2023   11:27 AM 04/08/2023    6:43 PM 12/10/2022   12:00 PM  CBC  WBC 4.0 - 10.5 K/uL 10.4  14.6    Hemoglobin 12.0 - 15.0 g/dL 96.0  45.4  09.8   Hematocrit 36.0 - 46.0 % 39.7  42.8  41.0   Platelets 150 - 400 K/uL 175  162          Latest Ref Rng & Units 05/22/2023   11:27 AM 04/08/2023    6:43 PM 12/10/2022   12:00 PM  CMP  Glucose 70 - 99 mg/dL 119  95  95   BUN 8 - 23 mg/dL 25  48  32   Creatinine 0.44 - 1.00 mg/dL 1.47  8.29  5.62   Sodium 135 - 145 mmol/L 137  135  136   Potassium 3.5 - 5.1 mmol/L 4.2  5.8  4.5   Chloride 98 - 111 mmol/L 101  100  96   CO2 22 - 32 mmol/L 28  19    Calcium 8.9 - 10.3 mg/dL 9.0  8.5    Total Protein 6.5 - 8.1 g/dL 7.2  7.4    Total Bilirubin 0.3  - 1.2 mg/dL 0.5  0.9    Alkaline Phos 38 - 126 U/L 127  110    AST 15 - 41 U/L 34  28    ALT 0 - 44 U/L 34  43        RADIOGRAPHIC STUDIES: I have personally reviewed the radiological images as listed and agreed with the findings in the report. No results found.    No orders of the defined types were placed in this encounter.  All questions were answered. The patient knows to call the clinic with any problems, questions or concerns. No barriers to learning was detected. The total time spent in the appointment was 25 minutes.     Malachy Mood, MD 05/22/2023   Carolin Coy, CMA, am acting as scribe for Malachy Mood, MD.   I have reviewed the above documentation for accuracy and completeness, and I agree with the above.

## 2023-05-22 NOTE — Telephone Encounter (Signed)
Critical lab value reported:  Cr+ 7.43 ESRD Pt.  Notified Dr. Mosetta Putt.

## 2023-05-22 NOTE — Assessment & Plan Note (Signed)
-  She previously had episodes of PE, was treated with Xarelto. 10 months after she came off Xarelto, she developed unprovoked DVT and PE. -I previously recommended her to continue anticoagulation indefinitely, if no contraindications. -Patient was previously on Coumadin, but switched to Eliquis due to her transportation issues with Coumadin check up. She takes Eliquis 2.5mg  BID, dose adjusted based on her ESRD on HD. -She previously stated she has been given high dose hepatin during dialysis due to clotting issue, which is fine.  -Continue Eliquis 2.5mg  BID and continue to watch for blood clot.

## 2023-05-23 DIAGNOSIS — D689 Coagulation defect, unspecified: Secondary | ICD-10-CM | POA: Diagnosis not present

## 2023-05-23 DIAGNOSIS — Z23 Encounter for immunization: Secondary | ICD-10-CM | POA: Diagnosis not present

## 2023-05-23 DIAGNOSIS — N186 End stage renal disease: Secondary | ICD-10-CM | POA: Diagnosis not present

## 2023-05-23 DIAGNOSIS — E875 Hyperkalemia: Secondary | ICD-10-CM | POA: Diagnosis not present

## 2023-05-23 DIAGNOSIS — N2581 Secondary hyperparathyroidism of renal origin: Secondary | ICD-10-CM | POA: Diagnosis not present

## 2023-05-23 DIAGNOSIS — L299 Pruritus, unspecified: Secondary | ICD-10-CM | POA: Diagnosis not present

## 2023-05-23 DIAGNOSIS — Z992 Dependence on renal dialysis: Secondary | ICD-10-CM | POA: Diagnosis not present

## 2023-05-26 DIAGNOSIS — E875 Hyperkalemia: Secondary | ICD-10-CM | POA: Diagnosis not present

## 2023-05-26 DIAGNOSIS — N186 End stage renal disease: Secondary | ICD-10-CM | POA: Diagnosis not present

## 2023-05-26 DIAGNOSIS — Z23 Encounter for immunization: Secondary | ICD-10-CM | POA: Diagnosis not present

## 2023-05-26 DIAGNOSIS — L299 Pruritus, unspecified: Secondary | ICD-10-CM | POA: Diagnosis not present

## 2023-05-26 DIAGNOSIS — N2581 Secondary hyperparathyroidism of renal origin: Secondary | ICD-10-CM | POA: Diagnosis not present

## 2023-05-26 DIAGNOSIS — Z992 Dependence on renal dialysis: Secondary | ICD-10-CM | POA: Diagnosis not present

## 2023-05-26 DIAGNOSIS — D689 Coagulation defect, unspecified: Secondary | ICD-10-CM | POA: Diagnosis not present

## 2023-05-28 DIAGNOSIS — Z23 Encounter for immunization: Secondary | ICD-10-CM | POA: Diagnosis not present

## 2023-05-28 DIAGNOSIS — N2581 Secondary hyperparathyroidism of renal origin: Secondary | ICD-10-CM | POA: Diagnosis not present

## 2023-05-28 DIAGNOSIS — L299 Pruritus, unspecified: Secondary | ICD-10-CM | POA: Diagnosis not present

## 2023-05-28 DIAGNOSIS — E875 Hyperkalemia: Secondary | ICD-10-CM | POA: Diagnosis not present

## 2023-05-28 DIAGNOSIS — Z992 Dependence on renal dialysis: Secondary | ICD-10-CM | POA: Diagnosis not present

## 2023-05-28 DIAGNOSIS — D689 Coagulation defect, unspecified: Secondary | ICD-10-CM | POA: Diagnosis not present

## 2023-05-28 DIAGNOSIS — N186 End stage renal disease: Secondary | ICD-10-CM | POA: Diagnosis not present

## 2023-05-30 DIAGNOSIS — I129 Hypertensive chronic kidney disease with stage 1 through stage 4 chronic kidney disease, or unspecified chronic kidney disease: Secondary | ICD-10-CM | POA: Diagnosis not present

## 2023-05-30 DIAGNOSIS — Z23 Encounter for immunization: Secondary | ICD-10-CM | POA: Diagnosis not present

## 2023-05-30 DIAGNOSIS — Z992 Dependence on renal dialysis: Secondary | ICD-10-CM | POA: Diagnosis not present

## 2023-05-30 DIAGNOSIS — N186 End stage renal disease: Secondary | ICD-10-CM | POA: Diagnosis not present

## 2023-05-30 DIAGNOSIS — N2581 Secondary hyperparathyroidism of renal origin: Secondary | ICD-10-CM | POA: Diagnosis not present

## 2023-05-30 DIAGNOSIS — D689 Coagulation defect, unspecified: Secondary | ICD-10-CM | POA: Diagnosis not present

## 2023-05-30 DIAGNOSIS — L299 Pruritus, unspecified: Secondary | ICD-10-CM | POA: Diagnosis not present

## 2023-05-30 DIAGNOSIS — E875 Hyperkalemia: Secondary | ICD-10-CM | POA: Diagnosis not present

## 2023-06-02 DIAGNOSIS — T82594A Other mechanical complication of infusion catheter, initial encounter: Secondary | ICD-10-CM | POA: Diagnosis not present

## 2023-06-02 DIAGNOSIS — N2581 Secondary hyperparathyroidism of renal origin: Secondary | ICD-10-CM | POA: Diagnosis not present

## 2023-06-02 DIAGNOSIS — T8249XD Other complication of vascular dialysis catheter, subsequent encounter: Secondary | ICD-10-CM | POA: Diagnosis not present

## 2023-06-02 DIAGNOSIS — Z992 Dependence on renal dialysis: Secondary | ICD-10-CM | POA: Diagnosis not present

## 2023-06-02 DIAGNOSIS — E875 Hyperkalemia: Secondary | ICD-10-CM | POA: Diagnosis not present

## 2023-06-02 DIAGNOSIS — D689 Coagulation defect, unspecified: Secondary | ICD-10-CM | POA: Diagnosis not present

## 2023-06-02 DIAGNOSIS — N186 End stage renal disease: Secondary | ICD-10-CM | POA: Diagnosis not present

## 2023-06-02 DIAGNOSIS — Z23 Encounter for immunization: Secondary | ICD-10-CM | POA: Diagnosis not present

## 2023-06-06 DIAGNOSIS — D689 Coagulation defect, unspecified: Secondary | ICD-10-CM | POA: Diagnosis not present

## 2023-06-06 DIAGNOSIS — E875 Hyperkalemia: Secondary | ICD-10-CM | POA: Diagnosis not present

## 2023-06-06 DIAGNOSIS — Z23 Encounter for immunization: Secondary | ICD-10-CM | POA: Diagnosis not present

## 2023-06-06 DIAGNOSIS — T82594A Other mechanical complication of infusion catheter, initial encounter: Secondary | ICD-10-CM | POA: Diagnosis not present

## 2023-06-06 DIAGNOSIS — N2581 Secondary hyperparathyroidism of renal origin: Secondary | ICD-10-CM | POA: Diagnosis not present

## 2023-06-06 DIAGNOSIS — N186 End stage renal disease: Secondary | ICD-10-CM | POA: Diagnosis not present

## 2023-06-06 DIAGNOSIS — Z992 Dependence on renal dialysis: Secondary | ICD-10-CM | POA: Diagnosis not present

## 2023-06-06 DIAGNOSIS — T8249XD Other complication of vascular dialysis catheter, subsequent encounter: Secondary | ICD-10-CM | POA: Diagnosis not present

## 2023-06-09 DIAGNOSIS — N186 End stage renal disease: Secondary | ICD-10-CM | POA: Diagnosis not present

## 2023-06-09 DIAGNOSIS — D689 Coagulation defect, unspecified: Secondary | ICD-10-CM | POA: Diagnosis not present

## 2023-06-09 DIAGNOSIS — Z992 Dependence on renal dialysis: Secondary | ICD-10-CM | POA: Diagnosis not present

## 2023-06-09 DIAGNOSIS — E875 Hyperkalemia: Secondary | ICD-10-CM | POA: Diagnosis not present

## 2023-06-09 DIAGNOSIS — T82594A Other mechanical complication of infusion catheter, initial encounter: Secondary | ICD-10-CM | POA: Diagnosis not present

## 2023-06-09 DIAGNOSIS — Z23 Encounter for immunization: Secondary | ICD-10-CM | POA: Diagnosis not present

## 2023-06-09 DIAGNOSIS — N2581 Secondary hyperparathyroidism of renal origin: Secondary | ICD-10-CM | POA: Diagnosis not present

## 2023-06-09 DIAGNOSIS — T8249XD Other complication of vascular dialysis catheter, subsequent encounter: Secondary | ICD-10-CM | POA: Diagnosis not present

## 2023-06-11 DIAGNOSIS — E875 Hyperkalemia: Secondary | ICD-10-CM | POA: Diagnosis not present

## 2023-06-11 DIAGNOSIS — Z992 Dependence on renal dialysis: Secondary | ICD-10-CM | POA: Diagnosis not present

## 2023-06-11 DIAGNOSIS — T8249XD Other complication of vascular dialysis catheter, subsequent encounter: Secondary | ICD-10-CM | POA: Diagnosis not present

## 2023-06-11 DIAGNOSIS — Z23 Encounter for immunization: Secondary | ICD-10-CM | POA: Diagnosis not present

## 2023-06-11 DIAGNOSIS — T82594A Other mechanical complication of infusion catheter, initial encounter: Secondary | ICD-10-CM | POA: Diagnosis not present

## 2023-06-11 DIAGNOSIS — D689 Coagulation defect, unspecified: Secondary | ICD-10-CM | POA: Diagnosis not present

## 2023-06-11 DIAGNOSIS — N186 End stage renal disease: Secondary | ICD-10-CM | POA: Diagnosis not present

## 2023-06-11 DIAGNOSIS — N2581 Secondary hyperparathyroidism of renal origin: Secondary | ICD-10-CM | POA: Diagnosis not present

## 2023-06-13 DIAGNOSIS — T8249XD Other complication of vascular dialysis catheter, subsequent encounter: Secondary | ICD-10-CM | POA: Diagnosis not present

## 2023-06-13 DIAGNOSIS — Z23 Encounter for immunization: Secondary | ICD-10-CM | POA: Diagnosis not present

## 2023-06-13 DIAGNOSIS — N186 End stage renal disease: Secondary | ICD-10-CM | POA: Diagnosis not present

## 2023-06-13 DIAGNOSIS — N2581 Secondary hyperparathyroidism of renal origin: Secondary | ICD-10-CM | POA: Diagnosis not present

## 2023-06-13 DIAGNOSIS — T82594A Other mechanical complication of infusion catheter, initial encounter: Secondary | ICD-10-CM | POA: Diagnosis not present

## 2023-06-13 DIAGNOSIS — D689 Coagulation defect, unspecified: Secondary | ICD-10-CM | POA: Diagnosis not present

## 2023-06-13 DIAGNOSIS — E875 Hyperkalemia: Secondary | ICD-10-CM | POA: Diagnosis not present

## 2023-06-13 DIAGNOSIS — Z992 Dependence on renal dialysis: Secondary | ICD-10-CM | POA: Diagnosis not present

## 2023-06-16 DIAGNOSIS — N2581 Secondary hyperparathyroidism of renal origin: Secondary | ICD-10-CM | POA: Diagnosis not present

## 2023-06-16 DIAGNOSIS — E875 Hyperkalemia: Secondary | ICD-10-CM | POA: Diagnosis not present

## 2023-06-16 DIAGNOSIS — N186 End stage renal disease: Secondary | ICD-10-CM | POA: Diagnosis not present

## 2023-06-16 DIAGNOSIS — T8249XD Other complication of vascular dialysis catheter, subsequent encounter: Secondary | ICD-10-CM | POA: Diagnosis not present

## 2023-06-16 DIAGNOSIS — T82594A Other mechanical complication of infusion catheter, initial encounter: Secondary | ICD-10-CM | POA: Diagnosis not present

## 2023-06-16 DIAGNOSIS — Z992 Dependence on renal dialysis: Secondary | ICD-10-CM | POA: Diagnosis not present

## 2023-06-16 DIAGNOSIS — D689 Coagulation defect, unspecified: Secondary | ICD-10-CM | POA: Diagnosis not present

## 2023-06-16 DIAGNOSIS — Z23 Encounter for immunization: Secondary | ICD-10-CM | POA: Diagnosis not present

## 2023-06-18 DIAGNOSIS — Z23 Encounter for immunization: Secondary | ICD-10-CM | POA: Diagnosis not present

## 2023-06-18 DIAGNOSIS — D689 Coagulation defect, unspecified: Secondary | ICD-10-CM | POA: Diagnosis not present

## 2023-06-18 DIAGNOSIS — Z992 Dependence on renal dialysis: Secondary | ICD-10-CM | POA: Diagnosis not present

## 2023-06-18 DIAGNOSIS — E875 Hyperkalemia: Secondary | ICD-10-CM | POA: Diagnosis not present

## 2023-06-18 DIAGNOSIS — T82594A Other mechanical complication of infusion catheter, initial encounter: Secondary | ICD-10-CM | POA: Diagnosis not present

## 2023-06-18 DIAGNOSIS — T8249XD Other complication of vascular dialysis catheter, subsequent encounter: Secondary | ICD-10-CM | POA: Diagnosis not present

## 2023-06-18 DIAGNOSIS — N186 End stage renal disease: Secondary | ICD-10-CM | POA: Diagnosis not present

## 2023-06-18 DIAGNOSIS — N2581 Secondary hyperparathyroidism of renal origin: Secondary | ICD-10-CM | POA: Diagnosis not present

## 2023-06-20 DIAGNOSIS — E875 Hyperkalemia: Secondary | ICD-10-CM | POA: Diagnosis not present

## 2023-06-20 DIAGNOSIS — Z992 Dependence on renal dialysis: Secondary | ICD-10-CM | POA: Diagnosis not present

## 2023-06-20 DIAGNOSIS — T82594A Other mechanical complication of infusion catheter, initial encounter: Secondary | ICD-10-CM | POA: Diagnosis not present

## 2023-06-20 DIAGNOSIS — N186 End stage renal disease: Secondary | ICD-10-CM | POA: Diagnosis not present

## 2023-06-20 DIAGNOSIS — T8249XD Other complication of vascular dialysis catheter, subsequent encounter: Secondary | ICD-10-CM | POA: Diagnosis not present

## 2023-06-20 DIAGNOSIS — Z23 Encounter for immunization: Secondary | ICD-10-CM | POA: Diagnosis not present

## 2023-06-20 DIAGNOSIS — N2581 Secondary hyperparathyroidism of renal origin: Secondary | ICD-10-CM | POA: Diagnosis not present

## 2023-06-20 DIAGNOSIS — D689 Coagulation defect, unspecified: Secondary | ICD-10-CM | POA: Diagnosis not present

## 2023-06-23 DIAGNOSIS — N2581 Secondary hyperparathyroidism of renal origin: Secondary | ICD-10-CM | POA: Diagnosis not present

## 2023-06-23 DIAGNOSIS — T82594A Other mechanical complication of infusion catheter, initial encounter: Secondary | ICD-10-CM | POA: Diagnosis not present

## 2023-06-23 DIAGNOSIS — D689 Coagulation defect, unspecified: Secondary | ICD-10-CM | POA: Diagnosis not present

## 2023-06-23 DIAGNOSIS — Z992 Dependence on renal dialysis: Secondary | ICD-10-CM | POA: Diagnosis not present

## 2023-06-23 DIAGNOSIS — Z23 Encounter for immunization: Secondary | ICD-10-CM | POA: Diagnosis not present

## 2023-06-23 DIAGNOSIS — N186 End stage renal disease: Secondary | ICD-10-CM | POA: Diagnosis not present

## 2023-06-23 DIAGNOSIS — E875 Hyperkalemia: Secondary | ICD-10-CM | POA: Diagnosis not present

## 2023-06-23 DIAGNOSIS — T8249XD Other complication of vascular dialysis catheter, subsequent encounter: Secondary | ICD-10-CM | POA: Diagnosis not present

## 2023-06-25 ENCOUNTER — Telehealth: Payer: Self-pay | Admitting: Pulmonary Disease

## 2023-06-25 DIAGNOSIS — E875 Hyperkalemia: Secondary | ICD-10-CM | POA: Diagnosis not present

## 2023-06-25 DIAGNOSIS — D689 Coagulation defect, unspecified: Secondary | ICD-10-CM | POA: Diagnosis not present

## 2023-06-25 DIAGNOSIS — N186 End stage renal disease: Secondary | ICD-10-CM | POA: Diagnosis not present

## 2023-06-25 DIAGNOSIS — Z992 Dependence on renal dialysis: Secondary | ICD-10-CM | POA: Diagnosis not present

## 2023-06-25 DIAGNOSIS — Z23 Encounter for immunization: Secondary | ICD-10-CM | POA: Diagnosis not present

## 2023-06-25 DIAGNOSIS — T8249XD Other complication of vascular dialysis catheter, subsequent encounter: Secondary | ICD-10-CM | POA: Diagnosis not present

## 2023-06-25 DIAGNOSIS — N2581 Secondary hyperparathyroidism of renal origin: Secondary | ICD-10-CM | POA: Diagnosis not present

## 2023-06-25 DIAGNOSIS — T82594A Other mechanical complication of infusion catheter, initial encounter: Secondary | ICD-10-CM | POA: Diagnosis not present

## 2023-06-25 NOTE — Telephone Encounter (Signed)
Pt will like a precription for [employer]  Pharmacy: [facility]

## 2023-06-26 DIAGNOSIS — N186 End stage renal disease: Secondary | ICD-10-CM | POA: Diagnosis not present

## 2023-06-26 DIAGNOSIS — D689 Coagulation defect, unspecified: Secondary | ICD-10-CM | POA: Diagnosis not present

## 2023-06-26 DIAGNOSIS — T8249XD Other complication of vascular dialysis catheter, subsequent encounter: Secondary | ICD-10-CM | POA: Diagnosis not present

## 2023-06-26 DIAGNOSIS — Z992 Dependence on renal dialysis: Secondary | ICD-10-CM | POA: Diagnosis not present

## 2023-06-26 DIAGNOSIS — T82594A Other mechanical complication of infusion catheter, initial encounter: Secondary | ICD-10-CM | POA: Diagnosis not present

## 2023-06-26 DIAGNOSIS — Z23 Encounter for immunization: Secondary | ICD-10-CM | POA: Diagnosis not present

## 2023-06-26 DIAGNOSIS — E875 Hyperkalemia: Secondary | ICD-10-CM | POA: Diagnosis not present

## 2023-06-26 DIAGNOSIS — N2581 Secondary hyperparathyroidism of renal origin: Secondary | ICD-10-CM | POA: Diagnosis not present

## 2023-06-29 DIAGNOSIS — I129 Hypertensive chronic kidney disease with stage 1 through stage 4 chronic kidney disease, or unspecified chronic kidney disease: Secondary | ICD-10-CM | POA: Diagnosis not present

## 2023-06-29 DIAGNOSIS — Z992 Dependence on renal dialysis: Secondary | ICD-10-CM | POA: Diagnosis not present

## 2023-06-29 DIAGNOSIS — N186 End stage renal disease: Secondary | ICD-10-CM | POA: Diagnosis not present

## 2023-06-30 DIAGNOSIS — Z992 Dependence on renal dialysis: Secondary | ICD-10-CM | POA: Diagnosis not present

## 2023-06-30 DIAGNOSIS — N2581 Secondary hyperparathyroidism of renal origin: Secondary | ICD-10-CM | POA: Diagnosis not present

## 2023-06-30 DIAGNOSIS — E875 Hyperkalemia: Secondary | ICD-10-CM | POA: Diagnosis not present

## 2023-06-30 DIAGNOSIS — R52 Pain, unspecified: Secondary | ICD-10-CM | POA: Diagnosis not present

## 2023-06-30 DIAGNOSIS — T82594A Other mechanical complication of infusion catheter, initial encounter: Secondary | ICD-10-CM | POA: Diagnosis not present

## 2023-06-30 DIAGNOSIS — N186 End stage renal disease: Secondary | ICD-10-CM | POA: Diagnosis not present

## 2023-06-30 DIAGNOSIS — D689 Coagulation defect, unspecified: Secondary | ICD-10-CM | POA: Diagnosis not present

## 2023-06-30 DIAGNOSIS — T8249XD Other complication of vascular dialysis catheter, subsequent encounter: Secondary | ICD-10-CM | POA: Diagnosis not present

## 2023-07-01 MED ORDER — STIOLTO RESPIMAT 2.5-2.5 MCG/ACT IN AERS: 2.0000 | INHALATION_SPRAY | Freq: Every day | RESPIRATORY_TRACT | 11 refills | Status: DC

## 2023-07-01 NOTE — Telephone Encounter (Signed)
Prescription has been sent into pharmacy. Pt has been called and notified. NFN

## 2023-07-02 DIAGNOSIS — N2581 Secondary hyperparathyroidism of renal origin: Secondary | ICD-10-CM | POA: Diagnosis not present

## 2023-07-02 DIAGNOSIS — D689 Coagulation defect, unspecified: Secondary | ICD-10-CM | POA: Diagnosis not present

## 2023-07-02 DIAGNOSIS — T82594A Other mechanical complication of infusion catheter, initial encounter: Secondary | ICD-10-CM | POA: Diagnosis not present

## 2023-07-02 DIAGNOSIS — Z992 Dependence on renal dialysis: Secondary | ICD-10-CM | POA: Diagnosis not present

## 2023-07-02 DIAGNOSIS — T8249XD Other complication of vascular dialysis catheter, subsequent encounter: Secondary | ICD-10-CM | POA: Diagnosis not present

## 2023-07-02 DIAGNOSIS — E875 Hyperkalemia: Secondary | ICD-10-CM | POA: Diagnosis not present

## 2023-07-02 DIAGNOSIS — N186 End stage renal disease: Secondary | ICD-10-CM | POA: Diagnosis not present

## 2023-07-02 DIAGNOSIS — R52 Pain, unspecified: Secondary | ICD-10-CM | POA: Diagnosis not present

## 2023-07-04 DIAGNOSIS — Z992 Dependence on renal dialysis: Secondary | ICD-10-CM | POA: Diagnosis not present

## 2023-07-04 DIAGNOSIS — R52 Pain, unspecified: Secondary | ICD-10-CM | POA: Diagnosis not present

## 2023-07-04 DIAGNOSIS — N2581 Secondary hyperparathyroidism of renal origin: Secondary | ICD-10-CM | POA: Diagnosis not present

## 2023-07-04 DIAGNOSIS — N186 End stage renal disease: Secondary | ICD-10-CM | POA: Diagnosis not present

## 2023-07-04 DIAGNOSIS — E875 Hyperkalemia: Secondary | ICD-10-CM | POA: Diagnosis not present

## 2023-07-04 DIAGNOSIS — D689 Coagulation defect, unspecified: Secondary | ICD-10-CM | POA: Diagnosis not present

## 2023-07-04 DIAGNOSIS — T82594A Other mechanical complication of infusion catheter, initial encounter: Secondary | ICD-10-CM | POA: Diagnosis not present

## 2023-07-04 DIAGNOSIS — T8249XD Other complication of vascular dialysis catheter, subsequent encounter: Secondary | ICD-10-CM | POA: Diagnosis not present

## 2023-07-07 DIAGNOSIS — T8249XD Other complication of vascular dialysis catheter, subsequent encounter: Secondary | ICD-10-CM | POA: Diagnosis not present

## 2023-07-07 DIAGNOSIS — Z992 Dependence on renal dialysis: Secondary | ICD-10-CM | POA: Diagnosis not present

## 2023-07-07 DIAGNOSIS — D689 Coagulation defect, unspecified: Secondary | ICD-10-CM | POA: Diagnosis not present

## 2023-07-07 DIAGNOSIS — N2581 Secondary hyperparathyroidism of renal origin: Secondary | ICD-10-CM | POA: Diagnosis not present

## 2023-07-07 DIAGNOSIS — R52 Pain, unspecified: Secondary | ICD-10-CM | POA: Diagnosis not present

## 2023-07-07 DIAGNOSIS — T82594A Other mechanical complication of infusion catheter, initial encounter: Secondary | ICD-10-CM | POA: Diagnosis not present

## 2023-07-07 DIAGNOSIS — N186 End stage renal disease: Secondary | ICD-10-CM | POA: Diagnosis not present

## 2023-07-07 DIAGNOSIS — E875 Hyperkalemia: Secondary | ICD-10-CM | POA: Diagnosis not present

## 2023-07-09 DIAGNOSIS — N186 End stage renal disease: Secondary | ICD-10-CM | POA: Diagnosis not present

## 2023-07-09 DIAGNOSIS — E875 Hyperkalemia: Secondary | ICD-10-CM | POA: Diagnosis not present

## 2023-07-09 DIAGNOSIS — N2581 Secondary hyperparathyroidism of renal origin: Secondary | ICD-10-CM | POA: Diagnosis not present

## 2023-07-09 DIAGNOSIS — D689 Coagulation defect, unspecified: Secondary | ICD-10-CM | POA: Diagnosis not present

## 2023-07-09 DIAGNOSIS — R52 Pain, unspecified: Secondary | ICD-10-CM | POA: Diagnosis not present

## 2023-07-09 DIAGNOSIS — T8249XD Other complication of vascular dialysis catheter, subsequent encounter: Secondary | ICD-10-CM | POA: Diagnosis not present

## 2023-07-09 DIAGNOSIS — Z992 Dependence on renal dialysis: Secondary | ICD-10-CM | POA: Diagnosis not present

## 2023-07-09 DIAGNOSIS — T82594A Other mechanical complication of infusion catheter, initial encounter: Secondary | ICD-10-CM | POA: Diagnosis not present

## 2023-07-11 DIAGNOSIS — T8249XD Other complication of vascular dialysis catheter, subsequent encounter: Secondary | ICD-10-CM | POA: Diagnosis not present

## 2023-07-11 DIAGNOSIS — N186 End stage renal disease: Secondary | ICD-10-CM | POA: Diagnosis not present

## 2023-07-11 DIAGNOSIS — Z992 Dependence on renal dialysis: Secondary | ICD-10-CM | POA: Diagnosis not present

## 2023-07-11 DIAGNOSIS — D689 Coagulation defect, unspecified: Secondary | ICD-10-CM | POA: Diagnosis not present

## 2023-07-11 DIAGNOSIS — N2581 Secondary hyperparathyroidism of renal origin: Secondary | ICD-10-CM | POA: Diagnosis not present

## 2023-07-11 DIAGNOSIS — R52 Pain, unspecified: Secondary | ICD-10-CM | POA: Diagnosis not present

## 2023-07-11 DIAGNOSIS — E875 Hyperkalemia: Secondary | ICD-10-CM | POA: Diagnosis not present

## 2023-07-11 DIAGNOSIS — T82594A Other mechanical complication of infusion catheter, initial encounter: Secondary | ICD-10-CM | POA: Diagnosis not present

## 2023-07-14 DIAGNOSIS — Z992 Dependence on renal dialysis: Secondary | ICD-10-CM | POA: Diagnosis not present

## 2023-07-14 DIAGNOSIS — R52 Pain, unspecified: Secondary | ICD-10-CM | POA: Diagnosis not present

## 2023-07-14 DIAGNOSIS — N186 End stage renal disease: Secondary | ICD-10-CM | POA: Diagnosis not present

## 2023-07-14 DIAGNOSIS — T8249XD Other complication of vascular dialysis catheter, subsequent encounter: Secondary | ICD-10-CM | POA: Diagnosis not present

## 2023-07-14 DIAGNOSIS — N2581 Secondary hyperparathyroidism of renal origin: Secondary | ICD-10-CM | POA: Diagnosis not present

## 2023-07-14 DIAGNOSIS — E875 Hyperkalemia: Secondary | ICD-10-CM | POA: Diagnosis not present

## 2023-07-14 DIAGNOSIS — T82594A Other mechanical complication of infusion catheter, initial encounter: Secondary | ICD-10-CM | POA: Diagnosis not present

## 2023-07-14 DIAGNOSIS — D689 Coagulation defect, unspecified: Secondary | ICD-10-CM | POA: Diagnosis not present

## 2023-07-16 DIAGNOSIS — Z992 Dependence on renal dialysis: Secondary | ICD-10-CM | POA: Diagnosis not present

## 2023-07-16 DIAGNOSIS — T8249XD Other complication of vascular dialysis catheter, subsequent encounter: Secondary | ICD-10-CM | POA: Diagnosis not present

## 2023-07-16 DIAGNOSIS — N2581 Secondary hyperparathyroidism of renal origin: Secondary | ICD-10-CM | POA: Diagnosis not present

## 2023-07-16 DIAGNOSIS — T82594A Other mechanical complication of infusion catheter, initial encounter: Secondary | ICD-10-CM | POA: Diagnosis not present

## 2023-07-16 DIAGNOSIS — R52 Pain, unspecified: Secondary | ICD-10-CM | POA: Diagnosis not present

## 2023-07-16 DIAGNOSIS — E875 Hyperkalemia: Secondary | ICD-10-CM | POA: Diagnosis not present

## 2023-07-16 DIAGNOSIS — N186 End stage renal disease: Secondary | ICD-10-CM | POA: Diagnosis not present

## 2023-07-16 DIAGNOSIS — D689 Coagulation defect, unspecified: Secondary | ICD-10-CM | POA: Diagnosis not present

## 2023-07-17 ENCOUNTER — Telehealth: Payer: Self-pay

## 2023-07-17 ENCOUNTER — Ambulatory Visit: Payer: 59

## 2023-07-17 DIAGNOSIS — I6523 Occlusion and stenosis of bilateral carotid arteries: Secondary | ICD-10-CM | POA: Diagnosis not present

## 2023-07-17 NOTE — Telephone Encounter (Signed)
Patient states she has stopped taking the atorvastatin 40mg  because it was giving her headaches. Pt stopped for 2/3 months ago

## 2023-07-18 DIAGNOSIS — T8249XD Other complication of vascular dialysis catheter, subsequent encounter: Secondary | ICD-10-CM | POA: Diagnosis not present

## 2023-07-18 DIAGNOSIS — R52 Pain, unspecified: Secondary | ICD-10-CM | POA: Diagnosis not present

## 2023-07-18 DIAGNOSIS — D689 Coagulation defect, unspecified: Secondary | ICD-10-CM | POA: Diagnosis not present

## 2023-07-18 DIAGNOSIS — T82594A Other mechanical complication of infusion catheter, initial encounter: Secondary | ICD-10-CM | POA: Diagnosis not present

## 2023-07-18 DIAGNOSIS — E875 Hyperkalemia: Secondary | ICD-10-CM | POA: Diagnosis not present

## 2023-07-18 DIAGNOSIS — N2581 Secondary hyperparathyroidism of renal origin: Secondary | ICD-10-CM | POA: Diagnosis not present

## 2023-07-18 DIAGNOSIS — N186 End stage renal disease: Secondary | ICD-10-CM | POA: Diagnosis not present

## 2023-07-18 DIAGNOSIS — Z992 Dependence on renal dialysis: Secondary | ICD-10-CM | POA: Diagnosis not present

## 2023-07-21 DIAGNOSIS — N186 End stage renal disease: Secondary | ICD-10-CM | POA: Diagnosis not present

## 2023-07-21 DIAGNOSIS — N2581 Secondary hyperparathyroidism of renal origin: Secondary | ICD-10-CM | POA: Diagnosis not present

## 2023-07-21 DIAGNOSIS — Z992 Dependence on renal dialysis: Secondary | ICD-10-CM | POA: Diagnosis not present

## 2023-07-21 DIAGNOSIS — E875 Hyperkalemia: Secondary | ICD-10-CM | POA: Diagnosis not present

## 2023-07-21 DIAGNOSIS — R52 Pain, unspecified: Secondary | ICD-10-CM | POA: Diagnosis not present

## 2023-07-21 DIAGNOSIS — D689 Coagulation defect, unspecified: Secondary | ICD-10-CM | POA: Diagnosis not present

## 2023-07-21 DIAGNOSIS — T82594A Other mechanical complication of infusion catheter, initial encounter: Secondary | ICD-10-CM | POA: Diagnosis not present

## 2023-07-21 DIAGNOSIS — T8249XD Other complication of vascular dialysis catheter, subsequent encounter: Secondary | ICD-10-CM | POA: Diagnosis not present

## 2023-07-22 NOTE — Progress Notes (Signed)
Carotid artery duplex 07/17/2023: Duplex suggests stenosis in the right internal carotid artery (minimal). Duplex suggests stenosis in the left internal carotid artery (minimal). Antegrade right vertebral artery flow. Antegrade left vertebral artery flow.

## 2023-07-23 DIAGNOSIS — E875 Hyperkalemia: Secondary | ICD-10-CM | POA: Diagnosis not present

## 2023-07-23 DIAGNOSIS — D689 Coagulation defect, unspecified: Secondary | ICD-10-CM | POA: Diagnosis not present

## 2023-07-23 DIAGNOSIS — T82594A Other mechanical complication of infusion catheter, initial encounter: Secondary | ICD-10-CM | POA: Diagnosis not present

## 2023-07-23 DIAGNOSIS — N186 End stage renal disease: Secondary | ICD-10-CM | POA: Diagnosis not present

## 2023-07-23 DIAGNOSIS — T8249XD Other complication of vascular dialysis catheter, subsequent encounter: Secondary | ICD-10-CM | POA: Diagnosis not present

## 2023-07-23 DIAGNOSIS — R52 Pain, unspecified: Secondary | ICD-10-CM | POA: Diagnosis not present

## 2023-07-23 DIAGNOSIS — N2581 Secondary hyperparathyroidism of renal origin: Secondary | ICD-10-CM | POA: Diagnosis not present

## 2023-07-23 DIAGNOSIS — Z992 Dependence on renal dialysis: Secondary | ICD-10-CM | POA: Diagnosis not present

## 2023-07-25 DIAGNOSIS — T82594A Other mechanical complication of infusion catheter, initial encounter: Secondary | ICD-10-CM | POA: Diagnosis not present

## 2023-07-25 DIAGNOSIS — N2581 Secondary hyperparathyroidism of renal origin: Secondary | ICD-10-CM | POA: Diagnosis not present

## 2023-07-25 DIAGNOSIS — Z992 Dependence on renal dialysis: Secondary | ICD-10-CM | POA: Diagnosis not present

## 2023-07-25 DIAGNOSIS — N186 End stage renal disease: Secondary | ICD-10-CM | POA: Diagnosis not present

## 2023-07-25 DIAGNOSIS — T8249XD Other complication of vascular dialysis catheter, subsequent encounter: Secondary | ICD-10-CM | POA: Diagnosis not present

## 2023-07-25 DIAGNOSIS — R52 Pain, unspecified: Secondary | ICD-10-CM | POA: Diagnosis not present

## 2023-07-25 DIAGNOSIS — E875 Hyperkalemia: Secondary | ICD-10-CM | POA: Diagnosis not present

## 2023-07-25 DIAGNOSIS — D689 Coagulation defect, unspecified: Secondary | ICD-10-CM | POA: Diagnosis not present

## 2023-07-28 DIAGNOSIS — E875 Hyperkalemia: Secondary | ICD-10-CM | POA: Diagnosis not present

## 2023-07-28 DIAGNOSIS — N2581 Secondary hyperparathyroidism of renal origin: Secondary | ICD-10-CM | POA: Diagnosis not present

## 2023-07-28 DIAGNOSIS — N186 End stage renal disease: Secondary | ICD-10-CM | POA: Diagnosis not present

## 2023-07-28 DIAGNOSIS — D689 Coagulation defect, unspecified: Secondary | ICD-10-CM | POA: Diagnosis not present

## 2023-07-28 DIAGNOSIS — T8249XD Other complication of vascular dialysis catheter, subsequent encounter: Secondary | ICD-10-CM | POA: Diagnosis not present

## 2023-07-28 DIAGNOSIS — R52 Pain, unspecified: Secondary | ICD-10-CM | POA: Diagnosis not present

## 2023-07-28 DIAGNOSIS — T82594A Other mechanical complication of infusion catheter, initial encounter: Secondary | ICD-10-CM | POA: Diagnosis not present

## 2023-07-28 DIAGNOSIS — Z992 Dependence on renal dialysis: Secondary | ICD-10-CM | POA: Diagnosis not present

## 2023-07-29 ENCOUNTER — Ambulatory Visit (INDEPENDENT_AMBULATORY_CARE_PROVIDER_SITE_OTHER): Payer: 59 | Admitting: Nurse Practitioner

## 2023-07-29 ENCOUNTER — Encounter: Payer: Self-pay | Admitting: Nurse Practitioner

## 2023-07-29 VITALS — BP 120/80 | HR 103 | Temp 98.5°F | Ht 62.0 in | Wt 200.2 lb

## 2023-07-29 DIAGNOSIS — G4733 Obstructive sleep apnea (adult) (pediatric): Secondary | ICD-10-CM | POA: Diagnosis not present

## 2023-07-29 DIAGNOSIS — Z992 Dependence on renal dialysis: Secondary | ICD-10-CM

## 2023-07-29 DIAGNOSIS — I132 Hypertensive heart and chronic kidney disease with heart failure and with stage 5 chronic kidney disease, or end stage renal disease: Secondary | ICD-10-CM

## 2023-07-29 DIAGNOSIS — Z6836 Body mass index (BMI) 36.0-36.9, adult: Secondary | ICD-10-CM

## 2023-07-29 DIAGNOSIS — I700 Atherosclerosis of aorta: Secondary | ICD-10-CM

## 2023-07-29 DIAGNOSIS — Z87891 Personal history of nicotine dependence: Secondary | ICD-10-CM

## 2023-07-29 DIAGNOSIS — Z79899 Other long term (current) drug therapy: Secondary | ICD-10-CM | POA: Diagnosis not present

## 2023-07-29 DIAGNOSIS — I739 Peripheral vascular disease, unspecified: Secondary | ICD-10-CM | POA: Diagnosis not present

## 2023-07-29 DIAGNOSIS — Z0000 Encounter for general adult medical examination without abnormal findings: Secondary | ICD-10-CM

## 2023-07-29 DIAGNOSIS — N186 End stage renal disease: Secondary | ICD-10-CM

## 2023-07-29 DIAGNOSIS — E559 Vitamin D deficiency, unspecified: Secondary | ICD-10-CM | POA: Diagnosis not present

## 2023-07-29 DIAGNOSIS — E6609 Other obesity due to excess calories: Secondary | ICD-10-CM

## 2023-07-29 DIAGNOSIS — R7309 Other abnormal glucose: Secondary | ICD-10-CM | POA: Diagnosis not present

## 2023-07-29 NOTE — Assessment & Plan Note (Signed)
She is not wearing a CPAP, reports having to get up to go to the bathroom multiple times a night.

## 2023-07-29 NOTE — Progress Notes (Signed)
Madelaine Bhat, CMA,acting as a Neurosurgeon for Arnette Felts, FNP.,have documented all relevant documentation on the behalf of Arnette Felts, FNP,as directed by  Arnette Felts, FNP while in the presence of Arnette Felts, FNP.  Subjective:    Patient ID: Whitney Walker , female    DOB: 1956-01-24 , 67 y.o.   MRN: [id number]  Chief Complaint  Patient presents with   Annual Exam    HPI  Patient presents today for HM, patient reports compliance with medications. Patient denies any chest pain, SOB, or headaches. Patient reports she has pain in her chest catheter. Patient previous had EKG on 04/09/2023. Continues dialysis on MWF - she has her vas cath to the left chest for quite some time.  She is having pain to her dialysis site reports having "blood clots" removed she is having pain radiating up to her neck. When she moves a certain way she will have a sharp pain. She is planning to ask for appt with vascular. She will ask [facility] to call [facility] for an appt.    She has been to the pulmonologist - she was given inhalers to try for her breathing .     Past Medical History:  Diagnosis Date   Anemia    Anxiety    CHF (congestive heart failure) (HCC)    Colon cancer (HCC)    treatment surgery   Complication of anesthesia    after first [facility]- [place] "couldnt walk after", patient denies having a spinal   COPD (chronic obstructive pulmonary disease) (HCC)    Coronary artery disease    Depression    DVT (deep venous thrombosis) (HCC)    Encounter for annual health examination 08/05/2023   ESRD on hemodialysis (HCC)    Hemo: MWF   History of blood transfusion 04/2018   Hypertension    07/07/18- no longer takes BP medications   Leucocytosis 04/28/2012   Meningitis    Pain in limb 07/30/2013   PE (pulmonary embolism)    Peripheral vascular disease (HCC)    Primary hypertension 11/15/2013   Restless legs    Shortness of breath    with exertion   Sleep apnea    SOB (shortness of breath)  03/03/2019   Vertigo      Family History  Problem Relation Age of Onset   Cancer Mother 84       breast and bone   Cancer Father 31       prostate   Hypertension Sister    Bleeding Disorder Sister    Cancer Cousin 20       breast cancer    Hypertension Daughter    Breast cancer Neg Hx      Current Outpatient Medications:    acetaminophen (TYLENOL) 325 MG tablet, Take 650 mg by mouth daily as needed for headache., Disp: , Rfl:    albuterol (VENTOLIN HFA) 108 (90 Base) MCG/ACT inhaler, Inhale 2 puffs into the lungs every 6 (six) hours as needed for wheezing or shortness of breath., Disp: 18 g, Rfl: 2   Ascorbic Acid (VITAMIN C) 100 MG tablet, Take 100 mg by mouth daily., Disp: , Rfl:    cyclobenzaprine (FLEXERIL) 10 MG tablet, Take 1 tablet (10 mg total) by mouth at bedtime., Disp: 30 tablet, Rfl: 0   diclofenac Sodium (VOLTAREN) 1 % GEL, Apply 2 g topically daily as needed (pain)., Disp: , Rfl:    ELIQUIS 5 MG TABS tablet, TAKE ONE TABLET BY MOUTH TWICE DAILY, Disp:  60 tablet, Rfl: 2   fludrocortisone (FLORINEF) 0.1 MG tablet, Take 1 tablet (0.1 mg total) by mouth every other day. Take in evening prior to dialysis days, Disp: 90 tablet, Rfl: 1   fluticasone (FLONASE) 50 MCG/ACT nasal spray, Place 2 sprays into both nostrils daily. (Patient taking differently: Place 2 sprays into both nostrils daily as needed for allergies.), Disp: 18.2 mL, Rfl: 2   fluticasone-salmeterol (ADVAIR HFA) 115-21 MCG/ACT inhaler, Inhale 2 puffs into the lungs 2 (two) times daily., Disp: 1 each, Rfl: 12   meclizine (ANTIVERT) 12.5 MG tablet, Take 1 tablet (12.5 mg total) by mouth 3 (three) times daily as needed for dizziness., Disp: 30 tablet, Rfl: 0   multivitamin (RENA-VIT) TABS tablet, Take 1 tablet by mouth daily. Patient is taking 1 tablet every night., Disp: , Rfl:    omeprazole (PRILOSEC) 20 MG capsule, TAKE ONE CAPSULE BY MOUTH ONCE DAILY, Disp: 90 capsule, Rfl: 1   Tiotropium Bromide-Olodaterol  (STIOLTO RESPIMAT) 2.5-2.5 MCG/ACT AERS, Inhale 2 puffs into the lungs daily., Disp: 4 g, Rfl: 11   vitamin E 200 UNIT capsule, Take 200 Units by mouth daily., Disp: , Rfl:    acetaminophen (TYLENOL) 650 MG CR tablet, Take 650 mg by mouth 3 (three) times daily as needed (arthritis). (Patient not taking: Reported on 05/08/2023), Disp: , Rfl:    atorvastatin (LIPITOR) 40 MG tablet, Take 1 tablet (40 mg total) by mouth daily. (Patient not taking: Reported on 07/29/2023), Disp: 90 tablet, Rfl: 3   BIOTIN SL, Place 10,000 mLs under the tongue daily. (Patient not taking: Reported on 07/29/2023), Disp: , Rfl:    sodium zirconium cyclosilicate (LOKELMA) 10 g PACK packet, Take 10 g by mouth daily. Patient takes on M,W,F (off dialysis days), Disp: , Rfl:    Allergies  Allergen Reactions   Difelikefalin Itching   Carnosine     Other reaction(s): Unknown   Gadolinium Derivatives Hives and Other (See Comments)    HIVES, Desc: HIVES W/ "DYE" USED FOR 1ST CT SCAN BUT NOT 2ND, NO PREMEDS USED, PT UNCERTAIN OF CIRCUMSTANCES,,?POSSIBLE MRI CONTRAST ALLERGY, ALL STUDIES DONE "SOMEWHERE" IN [place]//A.C[facility] Onset Date: [id number]   Iohexol Hives and Other (See Comments)    Desc: HIVES W/ "DYE" USED FOR 1ST CT SCAN BUT NOT 2ND, NO PREMEDS USED, PT UNCERTAIN OF CIRCUMSTANCES,,?POSSIBLE MRI CONTRAST ALLERGY, ALL STUDIES DONE "SOMEWHERE" IN [place]//A.C., Onset Date: 47829562    Iodinated Contrast Media Hives    1974   Iodine Hives    1974   Naltrexone Other (See Comments)    Unknown 1974       The patient states she uses status post hysterectomy for birth control. No LMP recorded. Patient has had a hysterectomy.. Negative for Dysmenorrhea and Negative for Menorrhagia. Negative for: breast discharge, breast lump(s), breast pain and breast self exam. Associated symptoms include abnormal vaginal bleeding. Pertinent negatives include abnormal bleeding (hematology), anxiety, decreased libido, depression,  difficulty falling sleep, dyspareunia, history of infertility, nocturia, sexual dysfunction, sleep disturbances, urinary incontinence, urinary urgency, vaginal discharge and vaginal itching. Diet regular;she is trying to eat a low potassium diet. She is being mindful the types of foods she eats. The patient states her exercise level is minimal   The patient's tobacco use is:  Social History   Tobacco Use  Smoking Status Former   Current packs/day: 0.00   Average packs/day: 0.5 packs/day for 50.0 years (25.0 ttl pk-yrs)   Types: Cigarettes   Start date: 10/13/1972   Quit date: 10/13/2022  Years since quitting: 0.8  Smokeless Tobacco Former  Tobacco Comments   She is using a vape when she runs out of her cigarettes, 7/27 - down to 1-2 cigs a day  . She has been exposed to passive smoke. The patient's alcohol use is:  Social History   Substance and Sexual Activity  Alcohol Use No   Alcohol/week: 0.0 standard drinks of alcohol   Comment: she used to drink alcohol, quit in 2010; h/o heavy use     Review of Systems  Constitutional: Negative.   HENT: Negative.    Eyes: Negative.   Respiratory: Negative.    Cardiovascular: Negative.   Gastrointestinal: Negative.   Endocrine: Negative.   Genitourinary: Negative.   Musculoskeletal: Negative.   Skin: Negative.   Allergic/Immunologic: Negative.   Neurological: Negative.   Hematological: Negative.   Psychiatric/Behavioral: Negative.       Today's Vitals   07/29/23 1408  BP: 120/80  Pulse: (!) 103  Temp: 98.5 F (36.9 C)  TempSrc: Oral  Weight: 200 lb 3.2 oz (90.8 kg)  Height: 5\' 2"  (1.575 m)  PainSc: 7   PainLoc: Neck   Body mass index is 36.62 kg/m.  Wt Readings from Last 3 Encounters:  07/29/23 200 lb 3.2 oz (90.8 kg)  05/22/23 204 lb 4.8 oz (92.7 kg)  05/08/23 203 lb (92.1 kg)     Objective:  Physical Exam Vitals reviewed.  Constitutional:      General: She is not in acute distress.    Appearance: Normal  appearance. She is well-developed. She is obese.  HENT:     Head: Normocephalic and atraumatic.     Right Ear: Hearing, tympanic membrane, ear canal and external ear normal. There is no impacted cerumen. Tympanic membrane is not bulging.     Left Ear: Hearing, tympanic membrane, ear canal and external ear normal. There is no impacted cerumen. Tympanic membrane is not bulging.     Nose: Nose normal.     Mouth/Throat:     Mouth: Mucous membranes are moist.  Eyes:     General: Lids are normal.     Extraocular Movements: Extraocular movements intact.     Conjunctiva/sclera: Conjunctivae normal.     Pupils: Pupils are equal, round, and reactive to light.     Funduscopic exam:    Right eye: No papilledema.        Left eye: No papilledema.  Neck:     Thyroid: No thyroid mass.     Vascular: No carotid bruit.  Cardiovascular:     Rate and Rhythm: Normal rate and regular rhythm.     Pulses: Normal pulses.     Heart sounds: Normal heart sounds. No murmur heard. Pulmonary:     Effort: Pulmonary effort is normal. No respiratory distress.     Breath sounds: Normal breath sounds. No wheezing.  Chest:     Chest wall: No mass.  Breasts:    Tanner Score is 5.     Right: Normal. No mass or tenderness.     Left: Normal. No mass or tenderness.  Abdominal:     General: Abdomen is flat. Bowel sounds are normal. There is no distension.     Palpations: Abdomen is soft.     Tenderness: There is no abdominal tenderness.  Genitourinary:    Rectum: Guaiac result negative.  Musculoskeletal:        General: No swelling or tenderness. Normal range of motion.     Cervical back: Full passive range of  motion without pain, normal range of motion and neck supple.     Right lower leg: No edema.     Left lower leg: No edema.  Lymphadenopathy:     Upper Body:     Right upper body: No supraclavicular, axillary or pectoral adenopathy.     Left upper body: No supraclavicular, axillary or pectoral adenopathy.   Skin:    General: Skin is warm and dry.     Capillary Refill: Capillary refill takes less than 2 seconds.     Coloration: Skin is not jaundiced.     Findings: No bruising.     Comments: Vas cath present right chest  Neurological:     General: No focal deficit present.     Mental Status: She is alert and oriented to person, place, and time.     Cranial Nerves: No cranial nerve deficit.     Sensory: No sensory deficit.  Psychiatric:        Mood and Affect: Mood normal.        Behavior: Behavior normal.        Thought Content: Thought content normal.        Judgment: Judgment normal.         Assessment And Plan:     Encounter for annual health examination Assessment & Plan: Behavior modifications discussed and diet history reviewed.   Pt will continue to exercise regularly and modify diet with low GI, plant based foods and decrease intake of processed foods.  Recommend intake of daily multivitamin, Vitamin D, and calcium.  Recommend mammogram and colonoscopy for preventive screenings, as well as recommend immunizations that include influenza, TDAP, and Shingles    ESRD (end stage renal disease) on dialysis (HCC)  History of smoking 10-25 pack years Assessment & Plan: Will order her yearly CT scan low-dose   Malignant hypertension with heart failure and end-stage renal dis [facility]) Assessment & Plan: Blood pressure is fairly controlled.  Continue current medications and follow-up with cardiology.   Atherosclerosis of aorta [facility]) Assessment & Plan: She is to continue statin, tolerating well.  Orders: -     Lipid panel  Vitamin D deficiency Assessment & Plan: Will check vitamin D level and supplement as needed.    Also encouraged to spend 15 minutes in the sun daily.     PAD (peripheral artery disease) (HCC) Assessment & Plan: Continue statin, tolerating well.  No complaints of numbness or tingling to her feet bilaterally   OSA (obstructive sleep  apnea) Assessment & Plan: She is not wearing a CPAP, reports having to get up to go to the bathroom multiple times a night.    Class 2 obesity due to excess calories with body mass index (BMI) of 36.0 to 36.9 in adult, unspecified whether serious comorbidity present Assessment & Plan: She is encouraged to strive for BMI less than 30 to decrease cardiac risk. Advised to aim for at least 150 minutes of exercise per week.   Orders: -     Hemoglobin A1c -     Lipid panel  Other long term (current) drug therapy   Return for 1 year physical, 6 month bp check. Patient was given opportunity to ask questions. Patient verbalized understanding of the plan and was able to repeat key elements of the plan. All questions were answered to their satisfaction.   Arnette Felts, FNP  I, Arnette Felts, FNP, have reviewed all documentation for this visit. The documentation on 07/29/23 for the exam, diagnosis, procedures, and  orders are all accurate and complete.

## 2023-07-30 DIAGNOSIS — E875 Hyperkalemia: Secondary | ICD-10-CM | POA: Diagnosis not present

## 2023-07-30 DIAGNOSIS — N186 End stage renal disease: Secondary | ICD-10-CM | POA: Diagnosis not present

## 2023-07-30 DIAGNOSIS — D689 Coagulation defect, unspecified: Secondary | ICD-10-CM | POA: Diagnosis not present

## 2023-07-30 DIAGNOSIS — T8249XD Other complication of vascular dialysis catheter, subsequent encounter: Secondary | ICD-10-CM | POA: Diagnosis not present

## 2023-07-30 DIAGNOSIS — N2581 Secondary hyperparathyroidism of renal origin: Secondary | ICD-10-CM | POA: Diagnosis not present

## 2023-07-30 DIAGNOSIS — I129 Hypertensive chronic kidney disease with stage 1 through stage 4 chronic kidney disease, or unspecified chronic kidney disease: Secondary | ICD-10-CM | POA: Diagnosis not present

## 2023-07-30 DIAGNOSIS — T82594A Other mechanical complication of infusion catheter, initial encounter: Secondary | ICD-10-CM | POA: Diagnosis not present

## 2023-07-30 DIAGNOSIS — R52 Pain, unspecified: Secondary | ICD-10-CM | POA: Diagnosis not present

## 2023-07-30 DIAGNOSIS — Z992 Dependence on renal dialysis: Secondary | ICD-10-CM | POA: Diagnosis not present

## 2023-07-31 ENCOUNTER — Ambulatory Visit: Payer: Self-pay

## 2023-07-31 DIAGNOSIS — Z992 Dependence on renal dialysis: Secondary | ICD-10-CM | POA: Diagnosis not present

## 2023-07-31 DIAGNOSIS — N186 End stage renal disease: Secondary | ICD-10-CM | POA: Diagnosis not present

## 2023-07-31 DIAGNOSIS — T82898A Other specified complication of vascular prosthetic devices, implants and grafts, initial encounter: Secondary | ICD-10-CM | POA: Diagnosis not present

## 2023-07-31 NOTE — Patient Outreach (Addendum)
Care Coordination   Follow Up Visit Note   07/31/2023 Name: Whitney Walker MRN: [id number] DOB: 10-Aug-1956  Whitney Walker is a 67 y.o. year old female who sees Whitney Felts, FNP for primary care. I spoke with  Whitney Walker by phone today.  What matters to the patients health and wellness today?  Patient would like to discuss alternative medications to statins with her Cardiologist.     Goals Addressed               This Visit's Progress     Patient Stated     I will consider seeing a Pulmonologist for my COPD (pt-stated)        Care Coordination Interventions: Evaluation of current treatment plan related to COPD and patient's adherence to plan as established by provider Reviewed with patient her upcoming scheduled follow up with Dr. Judeth Horn scheduled for 09/09/23 @1 :15 PM        To understand why my Atorvastatin was increased (pt-stated)        Care Coordination Interventions: Evaluation of current treatment plan related to aortic atherosclerosis and patient's adherence to plan as established by provider Review of patient status, including review of consultant's reports, relevant laboratory and other test results Reviewed medications with patient and discussed importance of medication adherence Determined patient is no longer taking her Atorvastatin due to experiencing severe headaches while taking this medication  Provided basic disease education to patient related to disease process related to CAD and Atherosclerosis Reviewed upcoming scheduled follow up with Dr. Jacinto Halim scheduled for 08/19/23 @2 :00 PM Encouraged patient to discuss alternative medication options with Dr. Jacinto Halim during her next scheduled appointment,patient verbalizes understanding and is agreeable Mailed printed educational materials related to elevated lipids Lipid Panel     Component Value Date/Time   CHOL 171 07/29/2023 1450   TRIG 223 (H) 07/29/2023 1450   HDL 48 07/29/2023 1450    CHOLHDL 3.6 07/29/2023 1450   CHOLHDL 5.6 09/08/2009 0403   VLDL 31 09/08/2009 0403   LDLCALC 86 07/29/2023 1450   LABVLDL 37 07/29/2023 1450    To avoid having more falls  Care Coordination Interventions: Provided written and verbal education re: potential causes of falls and Fall prevention strategies Reviewed medications and discussed potential side effects of medications such as dizziness and frequent urination Advised patient of importance of notifying provider of falls Assessed for falls since last encounter Assessed for patient interest regarding fall alert systems   Interventions Today    Flowsheet Row Most Recent Value  Chronic Disease   Chronic disease during today's visit Diabetes, Other, Chronic Kidney Disease/End Stage Renal Disease (ESRD), Congestive Heart Failure (CHF)  [Elevated Triglycerides,  Aorta Atherosclerosis,  ESRD]  General Interventions   General Interventions Discussed/Reviewed General Interventions Discussed, General Interventions Reviewed, Labs, Doctor Visits, Lipid Profile  Doctor Visits Discussed/Reviewed Doctor Visits Discussed, Doctor Visits Reviewed, PCP, Specialist  Education Interventions   Education Provided Provided Education, Provided Printed Education  Provided Verbal Education On Nutrition, Labs, Medication, When to see the doctor  Labs Reviewed Hgb A1c, Lipid Profile  Nutrition Interventions   Nutrition Discussed/Reviewed Nutrition Reviewed, Nutrition Discussed  Pharmacy Interventions   Pharmacy Dicussed/Reviewed Pharmacy Topics Discussed, Pharmacy Topics Reviewed, Medications and their functions, Medication Adherence  Medication Adherence Not taking medication  Safety Interventions   Safety Discussed/Reviewed Fall Risk, Safety Reviewed, Safety Discussed, Home Safety  Home Safety Assistive Devices          SDOH assessments and interventions completed:  No  Care Coordination Interventions:  Yes, provided   Follow up plan:  Follow up call scheduled for 09/11/23 @10 :30 AM    Encounter Outcome:  Pt. Visit Completed

## 2023-07-31 NOTE — Patient Instructions (Addendum)
Visit Information  Thank you for taking time to visit with me today. Please don't hesitate to contact me if I can be of assistance to you.   Following are the goals we discussed today:   Goals Addressed               This Visit's Progress     Patient Stated     I will consider seeing a Pulmonologist for my COPD (pt-stated)        Care Coordination Interventions: Evaluation of current treatment plan related to COPD and patient's adherence to plan as established by provider Reviewed with patient her upcoming scheduled follow up with Dr. Judeth Horn scheduled for 09/09/23 @1 :15 PM         To understand why my Atorvastatin was increased (pt-stated)        Care Coordination Interventions: Evaluation of current treatment plan related to aortic atherosclerosis and patient's adherence to plan as established by provider Review of patient status, including review of consultant's reports, relevant laboratory and other test results Reviewed medications with patient and discussed importance of medication adherence Determined patient is no longer taking her Atorvastatin due to experiencing severe headaches while taking this medication  Provided basic disease education to patient related to disease process related to CAD and Atherosclerosis Reviewed upcoming scheduled follow up with Dr. Jacinto Halim scheduled for 08/19/23 @2 :00 PM Encouraged patient to discuss alternative medication options with Dr. Jacinto Halim during her next scheduled appointment,patient verbalizes understanding and is agreeable Mailed printed educational materials related to elevated lipids Lipid Panel     Component Value Date/Time   CHOL 171 07/29/2023 1450   TRIG 223 (H) 07/29/2023 1450   HDL 48 07/29/2023 1450   CHOLHDL 3.6 07/29/2023 1450   CHOLHDL 5.6 09/08/2009 0403   VLDL 31 09/08/2009 0403   LDLCALC 86 07/29/2023 1450   LABVLDL 37 07/29/2023 1450    To avoid having more falls  Care Coordination Interventions: Provided  written and verbal education re: potential causes of falls and Fall prevention strategies Reviewed medications and discussed potential side effects of medications such as dizziness and frequent urination Advised patient of importance of notifying provider of falls Assessed for falls since last encounter Assessed for patient interest regarding fall alert systems       Our next appointment is by telephone on 09/11/23 at 10:30 AM  Please call the care guide team at [phone] if you need to cancel or reschedule your appointment.   If you are experiencing a Mental Health or Behavioral Health Crisis or need someone to talk to, please call 1-800-273-TALK (toll free, 24 hour hotline)  Patient verbalizes understanding of instructions and care plan provided today and agrees to view in MyChart. Active MyChart status and patient understanding of how to access instructions and care plan via MyChart confirmed with patient.     Delsa Sale, RN, BSN, CCM Care Management Coordinator [facility] Care Management  Direct Phone: [phone]

## 2023-08-01 DIAGNOSIS — N186 End stage renal disease: Secondary | ICD-10-CM | POA: Diagnosis not present

## 2023-08-01 DIAGNOSIS — Z992 Dependence on renal dialysis: Secondary | ICD-10-CM | POA: Diagnosis not present

## 2023-08-01 DIAGNOSIS — Z23 Encounter for immunization: Secondary | ICD-10-CM | POA: Diagnosis not present

## 2023-08-01 DIAGNOSIS — N2581 Secondary hyperparathyroidism of renal origin: Secondary | ICD-10-CM | POA: Diagnosis not present

## 2023-08-01 DIAGNOSIS — E875 Hyperkalemia: Secondary | ICD-10-CM | POA: Diagnosis not present

## 2023-08-01 DIAGNOSIS — D689 Coagulation defect, unspecified: Secondary | ICD-10-CM | POA: Diagnosis not present

## 2023-08-04 DIAGNOSIS — Z992 Dependence on renal dialysis: Secondary | ICD-10-CM | POA: Diagnosis not present

## 2023-08-04 DIAGNOSIS — D689 Coagulation defect, unspecified: Secondary | ICD-10-CM | POA: Diagnosis not present

## 2023-08-04 DIAGNOSIS — Z23 Encounter for immunization: Secondary | ICD-10-CM | POA: Diagnosis not present

## 2023-08-04 DIAGNOSIS — N186 End stage renal disease: Secondary | ICD-10-CM | POA: Diagnosis not present

## 2023-08-04 DIAGNOSIS — E875 Hyperkalemia: Secondary | ICD-10-CM | POA: Diagnosis not present

## 2023-08-04 DIAGNOSIS — N2581 Secondary hyperparathyroidism of renal origin: Secondary | ICD-10-CM | POA: Diagnosis not present

## 2023-08-05 ENCOUNTER — Encounter: Payer: Self-pay | Admitting: Nurse Practitioner

## 2023-08-05 DIAGNOSIS — N186 End stage renal disease: Secondary | ICD-10-CM | POA: Insufficient documentation

## 2023-08-05 DIAGNOSIS — I739 Peripheral vascular disease, unspecified: Secondary | ICD-10-CM | POA: Insufficient documentation

## 2023-08-05 DIAGNOSIS — Z87891 Personal history of nicotine dependence: Secondary | ICD-10-CM | POA: Insufficient documentation

## 2023-08-05 DIAGNOSIS — I132 Hypertensive heart and chronic kidney disease with heart failure and with stage 5 chronic kidney disease, or end stage renal disease: Secondary | ICD-10-CM | POA: Insufficient documentation

## 2023-08-05 DIAGNOSIS — E6609 Other obesity due to excess calories: Secondary | ICD-10-CM | POA: Insufficient documentation

## 2023-08-05 DIAGNOSIS — E559 Vitamin D deficiency, unspecified: Secondary | ICD-10-CM | POA: Insufficient documentation

## 2023-08-05 DIAGNOSIS — Z0000 Encounter for general adult medical examination without abnormal findings: Secondary | ICD-10-CM | POA: Insufficient documentation

## 2023-08-05 HISTORY — DX: Encounter for general adult medical examination without abnormal findings: Z00.00

## 2023-08-05 NOTE — Assessment & Plan Note (Signed)
Behavior modifications discussed and diet history reviewed.   Pt will continue to exercise regularly and modify diet with low GI, plant based foods and decrease intake of processed foods.  Recommend intake of daily multivitamin, Vitamin D, and calcium.  Recommend mammogram and colonoscopy for preventive screenings, as well as recommend immunizations that include influenza, TDAP, and Shingles  

## 2023-08-05 NOTE — Assessment & Plan Note (Signed)
Will order her yearly CT scan low-dose

## 2023-08-05 NOTE — Assessment & Plan Note (Signed)
Blood pressure is fairly controlled.  Continue current medications and follow-up with cardiology.

## 2023-08-05 NOTE — Assessment & Plan Note (Signed)
She is encouraged to strive for BMI less than 30 to decrease cardiac risk. Advised to aim for at least 150 minutes of exercise per week.  

## 2023-08-05 NOTE — Assessment & Plan Note (Signed)
Will check vitamin D level and supplement as needed.    Also encouraged to spend 15 minutes in the sun daily.   

## 2023-08-05 NOTE — Assessment & Plan Note (Signed)
She is to continue statin, tolerating well.

## 2023-08-05 NOTE — Assessment & Plan Note (Signed)
Continue statin, tolerating well.  No complaints of numbness or tingling to her feet bilaterally

## 2023-08-06 DIAGNOSIS — N2581 Secondary hyperparathyroidism of renal origin: Secondary | ICD-10-CM | POA: Diagnosis not present

## 2023-08-06 DIAGNOSIS — D689 Coagulation defect, unspecified: Secondary | ICD-10-CM | POA: Diagnosis not present

## 2023-08-06 DIAGNOSIS — E875 Hyperkalemia: Secondary | ICD-10-CM | POA: Diagnosis not present

## 2023-08-06 DIAGNOSIS — N186 End stage renal disease: Secondary | ICD-10-CM | POA: Diagnosis not present

## 2023-08-06 DIAGNOSIS — Z23 Encounter for immunization: Secondary | ICD-10-CM | POA: Diagnosis not present

## 2023-08-06 DIAGNOSIS — Z992 Dependence on renal dialysis: Secondary | ICD-10-CM | POA: Diagnosis not present

## 2023-08-08 DIAGNOSIS — D689 Coagulation defect, unspecified: Secondary | ICD-10-CM | POA: Diagnosis not present

## 2023-08-08 DIAGNOSIS — N2581 Secondary hyperparathyroidism of renal origin: Secondary | ICD-10-CM | POA: Diagnosis not present

## 2023-08-08 DIAGNOSIS — Z23 Encounter for immunization: Secondary | ICD-10-CM | POA: Diagnosis not present

## 2023-08-08 DIAGNOSIS — E875 Hyperkalemia: Secondary | ICD-10-CM | POA: Diagnosis not present

## 2023-08-08 DIAGNOSIS — N186 End stage renal disease: Secondary | ICD-10-CM | POA: Diagnosis not present

## 2023-08-08 DIAGNOSIS — Z992 Dependence on renal dialysis: Secondary | ICD-10-CM | POA: Diagnosis not present

## 2023-08-11 DIAGNOSIS — Z23 Encounter for immunization: Secondary | ICD-10-CM | POA: Diagnosis not present

## 2023-08-11 DIAGNOSIS — E875 Hyperkalemia: Secondary | ICD-10-CM | POA: Diagnosis not present

## 2023-08-11 DIAGNOSIS — N186 End stage renal disease: Secondary | ICD-10-CM | POA: Diagnosis not present

## 2023-08-11 DIAGNOSIS — N2581 Secondary hyperparathyroidism of renal origin: Secondary | ICD-10-CM | POA: Diagnosis not present

## 2023-08-11 DIAGNOSIS — Z992 Dependence on renal dialysis: Secondary | ICD-10-CM | POA: Diagnosis not present

## 2023-08-11 DIAGNOSIS — D689 Coagulation defect, unspecified: Secondary | ICD-10-CM | POA: Diagnosis not present

## 2023-08-12 ENCOUNTER — Ambulatory Visit
Admission: RE | Admit: 2023-08-12 | Discharge: 2023-08-12 | Disposition: A | Discharge status: Home / Self Care | Payer: 59 | Source: Ambulatory Visit | Attending: Nurse Practitioner | Admitting: Nurse Practitioner

## 2023-08-12 DIAGNOSIS — F17210 Nicotine dependence, cigarettes, uncomplicated: Secondary | ICD-10-CM | POA: Diagnosis not present

## 2023-08-13 ENCOUNTER — Other Ambulatory Visit: Payer: Self-pay

## 2023-08-13 DIAGNOSIS — N186 End stage renal disease: Secondary | ICD-10-CM | POA: Diagnosis not present

## 2023-08-13 DIAGNOSIS — D689 Coagulation defect, unspecified: Secondary | ICD-10-CM | POA: Diagnosis not present

## 2023-08-13 DIAGNOSIS — Z992 Dependence on renal dialysis: Secondary | ICD-10-CM | POA: Diagnosis not present

## 2023-08-13 DIAGNOSIS — N2581 Secondary hyperparathyroidism of renal origin: Secondary | ICD-10-CM | POA: Diagnosis not present

## 2023-08-13 DIAGNOSIS — E875 Hyperkalemia: Secondary | ICD-10-CM | POA: Diagnosis not present

## 2023-08-13 DIAGNOSIS — Z23 Encounter for immunization: Secondary | ICD-10-CM | POA: Diagnosis not present

## 2023-08-13 MED ORDER — MECLIZINE HCL 12.5 MG PO TABS: 12.5000 mg | ORAL_TABLET | Freq: Three times a day (TID) | ORAL | 0 refills | Status: DC | PRN

## 2023-08-14 ENCOUNTER — Other Ambulatory Visit (HOSPITAL_COMMUNITY): Payer: Self-pay

## 2023-08-14 ENCOUNTER — Other Ambulatory Visit: Payer: Self-pay

## 2023-08-14 MED ORDER — MECLIZINE HCL 12.5 MG PO TABS
12.5000 mg | ORAL_TABLET | Freq: Three times a day (TID) | ORAL | 0 refills | Status: DC | PRN
  Filled 2023-08-14: qty 30, 10d supply, fill #0

## 2023-08-14 MED ORDER — MECLIZINE HCL 12.5 MG PO TABS: 12.5000 mg | ORAL_TABLET | Freq: Three times a day (TID) | ORAL | 0 refills | Status: DC | PRN

## 2023-08-15 DIAGNOSIS — N2581 Secondary hyperparathyroidism of renal origin: Secondary | ICD-10-CM | POA: Diagnosis not present

## 2023-08-15 DIAGNOSIS — E875 Hyperkalemia: Secondary | ICD-10-CM | POA: Diagnosis not present

## 2023-08-15 DIAGNOSIS — N186 End stage renal disease: Secondary | ICD-10-CM | POA: Diagnosis not present

## 2023-08-15 DIAGNOSIS — Z23 Encounter for immunization: Secondary | ICD-10-CM | POA: Diagnosis not present

## 2023-08-15 DIAGNOSIS — Z992 Dependence on renal dialysis: Secondary | ICD-10-CM | POA: Diagnosis not present

## 2023-08-15 DIAGNOSIS — D689 Coagulation defect, unspecified: Secondary | ICD-10-CM | POA: Diagnosis not present

## 2023-08-18 DIAGNOSIS — N2581 Secondary hyperparathyroidism of renal origin: Secondary | ICD-10-CM | POA: Diagnosis not present

## 2023-08-18 DIAGNOSIS — N186 End stage renal disease: Secondary | ICD-10-CM | POA: Diagnosis not present

## 2023-08-18 DIAGNOSIS — D689 Coagulation defect, unspecified: Secondary | ICD-10-CM | POA: Diagnosis not present

## 2023-08-18 DIAGNOSIS — E875 Hyperkalemia: Secondary | ICD-10-CM | POA: Diagnosis not present

## 2023-08-18 DIAGNOSIS — Z23 Encounter for immunization: Secondary | ICD-10-CM | POA: Diagnosis not present

## 2023-08-18 DIAGNOSIS — Z992 Dependence on renal dialysis: Secondary | ICD-10-CM | POA: Diagnosis not present

## 2023-08-19 ENCOUNTER — Ambulatory Visit: Payer: Medicaid Other | Admitting: Cardiology

## 2023-08-20 DIAGNOSIS — N2581 Secondary hyperparathyroidism of renal origin: Secondary | ICD-10-CM | POA: Diagnosis not present

## 2023-08-20 DIAGNOSIS — E875 Hyperkalemia: Secondary | ICD-10-CM | POA: Diagnosis not present

## 2023-08-20 DIAGNOSIS — Z992 Dependence on renal dialysis: Secondary | ICD-10-CM | POA: Diagnosis not present

## 2023-08-20 DIAGNOSIS — N186 End stage renal disease: Secondary | ICD-10-CM | POA: Diagnosis not present

## 2023-08-20 DIAGNOSIS — D689 Coagulation defect, unspecified: Secondary | ICD-10-CM | POA: Diagnosis not present

## 2023-08-20 DIAGNOSIS — Z23 Encounter for immunization: Secondary | ICD-10-CM | POA: Diagnosis not present

## 2023-08-22 DIAGNOSIS — N186 End stage renal disease: Secondary | ICD-10-CM | POA: Diagnosis not present

## 2023-08-22 DIAGNOSIS — N2581 Secondary hyperparathyroidism of renal origin: Secondary | ICD-10-CM | POA: Diagnosis not present

## 2023-08-22 DIAGNOSIS — Z23 Encounter for immunization: Secondary | ICD-10-CM | POA: Diagnosis not present

## 2023-08-22 DIAGNOSIS — Z992 Dependence on renal dialysis: Secondary | ICD-10-CM | POA: Diagnosis not present

## 2023-08-22 DIAGNOSIS — D689 Coagulation defect, unspecified: Secondary | ICD-10-CM | POA: Diagnosis not present

## 2023-08-22 DIAGNOSIS — E875 Hyperkalemia: Secondary | ICD-10-CM | POA: Diagnosis not present

## 2023-08-25 ENCOUNTER — Ambulatory Visit: Payer: Medicaid Other | Admitting: Cardiology

## 2023-08-25 DIAGNOSIS — D689 Coagulation defect, unspecified: Secondary | ICD-10-CM | POA: Diagnosis not present

## 2023-08-25 DIAGNOSIS — N2581 Secondary hyperparathyroidism of renal origin: Secondary | ICD-10-CM | POA: Diagnosis not present

## 2023-08-25 DIAGNOSIS — Z23 Encounter for immunization: Secondary | ICD-10-CM | POA: Diagnosis not present

## 2023-08-25 DIAGNOSIS — N186 End stage renal disease: Secondary | ICD-10-CM | POA: Diagnosis not present

## 2023-08-25 DIAGNOSIS — Z992 Dependence on renal dialysis: Secondary | ICD-10-CM | POA: Diagnosis not present

## 2023-08-25 DIAGNOSIS — E875 Hyperkalemia: Secondary | ICD-10-CM | POA: Diagnosis not present

## 2023-08-27 DIAGNOSIS — N2581 Secondary hyperparathyroidism of renal origin: Secondary | ICD-10-CM | POA: Diagnosis not present

## 2023-08-27 DIAGNOSIS — D689 Coagulation defect, unspecified: Secondary | ICD-10-CM | POA: Diagnosis not present

## 2023-08-27 DIAGNOSIS — N186 End stage renal disease: Secondary | ICD-10-CM | POA: Diagnosis not present

## 2023-08-27 DIAGNOSIS — Z992 Dependence on renal dialysis: Secondary | ICD-10-CM | POA: Diagnosis not present

## 2023-08-27 DIAGNOSIS — E875 Hyperkalemia: Secondary | ICD-10-CM | POA: Diagnosis not present

## 2023-08-27 DIAGNOSIS — Z23 Encounter for immunization: Secondary | ICD-10-CM | POA: Diagnosis not present

## 2023-08-29 DIAGNOSIS — Z23 Encounter for immunization: Secondary | ICD-10-CM | POA: Diagnosis not present

## 2023-08-29 DIAGNOSIS — N186 End stage renal disease: Secondary | ICD-10-CM | POA: Diagnosis not present

## 2023-08-29 DIAGNOSIS — Z992 Dependence on renal dialysis: Secondary | ICD-10-CM | POA: Diagnosis not present

## 2023-08-29 DIAGNOSIS — E875 Hyperkalemia: Secondary | ICD-10-CM | POA: Diagnosis not present

## 2023-08-29 DIAGNOSIS — N2581 Secondary hyperparathyroidism of renal origin: Secondary | ICD-10-CM | POA: Diagnosis not present

## 2023-08-29 DIAGNOSIS — D689 Coagulation defect, unspecified: Secondary | ICD-10-CM | POA: Diagnosis not present

## 2023-08-30 DIAGNOSIS — I129 Hypertensive chronic kidney disease with stage 1 through stage 4 chronic kidney disease, or unspecified chronic kidney disease: Secondary | ICD-10-CM | POA: Diagnosis not present

## 2023-08-30 DIAGNOSIS — N186 End stage renal disease: Secondary | ICD-10-CM | POA: Diagnosis not present

## 2023-08-30 DIAGNOSIS — Z992 Dependence on renal dialysis: Secondary | ICD-10-CM | POA: Diagnosis not present

## 2023-09-01 DIAGNOSIS — N2581 Secondary hyperparathyroidism of renal origin: Secondary | ICD-10-CM | POA: Diagnosis not present

## 2023-09-01 DIAGNOSIS — Z992 Dependence on renal dialysis: Secondary | ICD-10-CM | POA: Diagnosis not present

## 2023-09-01 DIAGNOSIS — N186 End stage renal disease: Secondary | ICD-10-CM | POA: Diagnosis not present

## 2023-09-01 DIAGNOSIS — D689 Coagulation defect, unspecified: Secondary | ICD-10-CM | POA: Diagnosis not present

## 2023-09-03 DIAGNOSIS — E875 Hyperkalemia: Secondary | ICD-10-CM | POA: Diagnosis not present

## 2023-09-03 DIAGNOSIS — D509 Iron deficiency anemia, unspecified: Secondary | ICD-10-CM | POA: Diagnosis not present

## 2023-09-03 DIAGNOSIS — R52 Pain, unspecified: Secondary | ICD-10-CM | POA: Diagnosis not present

## 2023-09-03 DIAGNOSIS — D689 Coagulation defect, unspecified: Secondary | ICD-10-CM | POA: Diagnosis not present

## 2023-09-03 DIAGNOSIS — N186 End stage renal disease: Secondary | ICD-10-CM | POA: Diagnosis not present

## 2023-09-03 DIAGNOSIS — N2581 Secondary hyperparathyroidism of renal origin: Secondary | ICD-10-CM | POA: Diagnosis not present

## 2023-09-03 DIAGNOSIS — Z992 Dependence on renal dialysis: Secondary | ICD-10-CM | POA: Diagnosis not present

## 2023-09-05 DIAGNOSIS — N186 End stage renal disease: Secondary | ICD-10-CM | POA: Diagnosis not present

## 2023-09-05 DIAGNOSIS — D689 Coagulation defect, unspecified: Secondary | ICD-10-CM | POA: Diagnosis not present

## 2023-09-05 DIAGNOSIS — Z992 Dependence on renal dialysis: Secondary | ICD-10-CM | POA: Diagnosis not present

## 2023-09-05 DIAGNOSIS — N2581 Secondary hyperparathyroidism of renal origin: Secondary | ICD-10-CM | POA: Diagnosis not present

## 2023-09-08 DIAGNOSIS — N2581 Secondary hyperparathyroidism of renal origin: Secondary | ICD-10-CM | POA: Diagnosis not present

## 2023-09-08 DIAGNOSIS — N186 End stage renal disease: Secondary | ICD-10-CM | POA: Diagnosis not present

## 2023-09-08 DIAGNOSIS — D689 Coagulation defect, unspecified: Secondary | ICD-10-CM | POA: Diagnosis not present

## 2023-09-08 DIAGNOSIS — Z992 Dependence on renal dialysis: Secondary | ICD-10-CM | POA: Diagnosis not present

## 2023-09-09 ENCOUNTER — Encounter: Payer: Self-pay | Admitting: Pulmonary Disease

## 2023-09-09 ENCOUNTER — Ambulatory Visit (INDEPENDENT_AMBULATORY_CARE_PROVIDER_SITE_OTHER): Payer: 59 | Admitting: Pulmonary Disease

## 2023-09-09 VITALS — BP 132/74 | HR 104 | Temp 98.4°F | Ht 61.0 in | Wt 200.6 lb

## 2023-09-09 DIAGNOSIS — R0609 Other forms of dyspnea: Secondary | ICD-10-CM | POA: Diagnosis not present

## 2023-09-09 MED ORDER — STIOLTO RESPIMAT 2.5-2.5 MCG/ACT IN AERS: 2.0000 | INHALATION_SPRAY | Freq: Every day | RESPIRATORY_TRACT | 11 refills | Status: DC

## 2023-09-09 NOTE — Patient Instructions (Signed)
Nice to see you  Stiolto refilled  Return to clinic in 6 months or sooner as needed with Dr. Judeth Horn

## 2023-09-09 NOTE — Progress Notes (Signed)
@Patient  ID: Whitney Walker, female    DOB: January 18, 1956, 67 y.o.   MRN: [id number]  Chief Complaint  Patient presents with   Follow-up    DOE persistent.  Patient states she feels the same as at last OV.  Still has cough and SOB. Lung cancer screening CT August 2024.    Referring provider: Arnette Felts, FNP  HPI:   67 y.o. woman whom we are seeing for evaluation of DOE. Most recent PCP note reviewed.  Most recent cardiology note reviewed..   Returns for routine follow-up.  Overall doing okay.  Was prescribed Stiolto in the interim.  Does think this helps with her dyspnea on a day-to-day basis.  Get around a bit easier.  In general does have fatigue etc. worse on dialysis days but overall think Stiolto has been beneficial.  Reviewed together results of her recent low-dose CT scan for lung cancer screening.  Lung RADS two 1 year follow-up.  Overall good news.   HPI initial visit:  Patient reports DOE for some time. Many years. Worse on inclines or stairs. Has tried inhalers such as Breo in the past - she does not think it helps much. Recently prescribed advair. No time of day when symptoms are better or worse. No seasonal or environmental factors that make things better or worse. No other alleviating or exacerbating factors. She notes DOE is better after HD and worsened the days between HD sessions. Notes severity of DOE worse when she drinks more water.  Most recent CT 07/2022 reveals mild emphysema and bronchial wall thickening on my review and interpretation. She has about 25 pack year smoking history.  She had spirometry in 2018 that is not consistent with COPD, but suggestive of air trapping vs restriction on my review and interpretation.  Questionaires / Pulmonary Flowsheets:   ACT:      No data to display          MMRC:     No data to display          Epworth:      No data to display          Tests:   FENO:  No results found for:  "NITRICOXIDE"  PFT:    Latest Ref Rng & Units 05/01/2023    3:30 PM  PFT Results  FVC-Pre L 1.55   FVC-Predicted Pre % 54   FVC-Post L 1.54   FVC-Predicted Post % 53   Pre FEV1/FVC % % 83   Post FEV1/FCV % % 84   FEV1-Pre L 1.28   FEV1-Predicted Pre % 59   FEV1-Post L 1.30   DLCO uncorrected ml/min/mmHg 9.19   DLCO UNC% % 50   DLCO corrected ml/min/mmHg 9.16   DLCO COR %Predicted % 49   DLVA Predicted % 68   TLC L 4.04   TLC % Predicted % 84   RV % Predicted % 118   Personally reviewed interpreted spirometry just with moderate restriction versus air trapping, lung volumes consistent with air trapping.  DLCO moderately reduced.  WALK:     03/12/2017    3:33 PM  SIX MIN WALK  Tech Comments: fast walk, no complications/TA/CMA    Imaging: Personally reviewed and as per EMR CT CHEST LUNG CA SCREEN LOW DOSE W/O CM  Result Date: 08/19/2023 CLINICAL DATA:  67 year old female current smoker with 58 pack-year history of smoking. Lung cancer screening examination. EXAM: CT CHEST WITHOUT CONTRAST LOW-DOSE FOR LUNG CANCER SCREENING TECHNIQUE: Multidetector CT  imaging of the chest was performed following the standard protocol without IV contrast. RADIATION DOSE REDUCTION: This exam was performed according to the departmental dose-optimization program which includes automated exposure control, adjustment of the mA and/or kV according to patient size and/or use of iterative reconstruction technique. COMPARISON:  Low-dose lung cancer screening chest CT 08/08/2022. FINDINGS: Cardiovascular: Heart size is normal. There is no significant pericardial fluid, thickening or pericardial calcification. There is aortic atherosclerosis, as well as atherosclerosis of the great vessels of the mediastinum and the coronary arteries, including calcified atherosclerotic plaque in the left main, left anterior descending, left circumflex and right coronary arteries. Calcifications of the aortic valve and mitral  annulus. Left-sided PermCath with tip terminating in the right atrium. Dilatation of the pulmonic trunk (3.9 cm in diameter). Mediastinum/Nodes: No pathologically enlarged mediastinal or hilar lymph nodes. Please note that accurate exclusion of hilar adenopathy is limited on noncontrast CT scans. Esophagus is unremarkable in appearance. No axillary lymphadenopathy. Lungs/Pleura: Several small pulmonary nodules are noted, largest of which is in the right upper lobe (axial image 70) with a volume derived mean diameter of 3.5 mm. No other larger more suspicious appearing pulmonary nodules or masses are noted. No acute consolidative airspace disease. No pleural effusions. Mild diffuse bronchial wall thickening with mild centrilobular and paraseptal emphysema. Upper Abdomen: Aortic atherosclerosis. Severe atrophy of the kidneys bilaterally. Status post cholecystectomy. Liver has a nodular contour, suggesting underlying cirrhosis. Musculoskeletal: There are no aggressive appearing lytic or blastic lesions noted in the visualized portions of the skeleton. IMPRESSION: 1. Lung-RADS 2S, benign appearance or behavior. Continue annual screening with low-dose chest CT without contrast in 12 months. 2. The "S" modifier above refers to potentially clinically significant non lung cancer related findings. Specifically, there is aortic atherosclerosis, in addition to left main and three-vessel coronary artery disease. Please note that although the presence of coronary artery calcium documents the presence of coronary artery disease, the severity of this disease and any potential stenosis cannot be assessed on this non-gated CT examination. Assessment for potential risk factor modification, dietary therapy or pharmacologic therapy may be warranted, if clinically indicated. 3. Mild diffuse bronchial wall thickening with mild centrilobular and paraseptal emphysema; imaging findings suggestive of underlying COPD. 4. There are  calcifications of the aortic valve and mitral annulus. Echocardiographic correlation for evaluation of potential valvular dysfunction may be warranted if clinically indicated. 5. Dilatation of the pulmonic trunk (3.9 cm in diameter), concerning for pulmonary arterial hypertension. 6. Nodular contour of the liver suggestive of underlying cirrhosis. Aortic Atherosclerosis (ICD10-I70.0) and Emphysema (ICD10-J43.9). Electronically Signed   By: Trudie Reed M.D.   On: 08/19/2023 07:22    Lab Results: Personally reviewed CBC    Component Value Date/Time   WBC 10.4 05/22/2023 1127   WBC 14.6 (H) 04/08/2023 1843   RBC 4.25 05/22/2023 1127   HGB 13.0 05/22/2023 1127   HGB 10.3 (L) 05/30/2022 1604   HGB 10.9 (L) 07/07/2017 1235   HCT 39.7 05/22/2023 1127   HCT 31.8 (L) 05/30/2022 1604   HCT 35.0 07/07/2017 1235   PLT 175 05/22/2023 1127   PLT 285 05/30/2022 1604   MCV 93.4 05/22/2023 1127   MCV 94 05/30/2022 1604   MCV 85.2 07/07/2017 1235   MCH 30.6 05/22/2023 1127   MCHC 32.7 05/22/2023 1127   RDW 14.5 05/22/2023 1127   RDW 13.4 05/30/2022 1604   RDW 15.8 (H) 07/07/2017 1235   LYMPHSABS 2.0 05/22/2023 1127   LYMPHSABS 2.4 07/07/2017 1235  MONOABS 1.0 05/22/2023 1127   MONOABS 0.8 07/07/2017 1235   EOSABS 0.3 05/22/2023 1127   EOSABS 0.2 07/07/2017 1235   BASOSABS 0.1 05/22/2023 1127   BASOSABS 0.0 07/07/2017 1235    BMET    Component Value Date/Time   NA 137 05/22/2023 1127   NA 139 05/30/2022 1604   NA 138 07/07/2017 1235   K 4.2 05/22/2023 1127   K 4.5 07/07/2017 1235   CL 101 05/22/2023 1127   CL 111 (H) 12/04/2012 1056   CO2 28 05/22/2023 1127   CO2 17 (L) 07/07/2017 1235   GLUCOSE 125 (H) 05/22/2023 1127   GLUCOSE 85 07/07/2017 1235   GLUCOSE 97 12/04/2012 1056   BUN 25 (H) 05/22/2023 1127   BUN 30 (H) 05/30/2022 1604   BUN 40.8 (H) 07/07/2017 1235   CREATININE 7.43 (HH) 05/22/2023 1127   CREATININE 3.4 (HH) 07/07/2017 1235   CALCIUM 9.0 05/22/2023 1127    CALCIUM 9.5 07/07/2017 1235   GFRNONAA 6 (L) 05/22/2023 1127   GFRAA 7 (L) 05/18/2020 1036    BNP    Component Value Date/Time   BNP 406.5 (H) 10/30/2020 0407    ProBNP    Component Value Date/Time   PROBNP 435.7 (H) 03/09/2014 1421    Specialty Problems       Pulmonary Problems   Dyspnea on exertion   OSA (obstructive sleep apnea)    Allergies  Allergen Reactions   Difelikefalin Itching   Carnosine     Other reaction(s): Unknown   Gadolinium Derivatives Hives and Other (See Comments)    HIVES, Desc: HIVES W/ "DYE" USED FOR 1ST CT SCAN BUT NOT 2ND, NO PREMEDS USED, PT UNCERTAIN OF CIRCUMSTANCES,,?POSSIBLE MRI CONTRAST ALLERGY, ALL STUDIES DONE "SOMEWHERE" IN [place]//A.C., Onset Date: [id number]   Iohexol Hives and Other (See Comments)    Desc: HIVES W/ "DYE" USED FOR 1ST CT SCAN BUT NOT 2ND, NO PREMEDS USED, PT UNCERTAIN OF CIRCUMSTANCES,,?POSSIBLE MRI CONTRAST ALLERGY, ALL STUDIES DONE "SOMEWHERE" IN [place]//A.C., Onset Date: 29528413    Iodinated Contrast Media Hives    1974   Iodine Hives    1974   Naltrexone Other (See Comments)    Unknown 1974     Immunization History  Administered Date(s) Administered   Fluad Quad(high Dose 65+) 10/04/2019   Hepatitis B, ADULT 09/28/2018, 05/19/2020   Hepb-cpg 04/16/2023   Influenza, High Dose Seasonal PF 12/29/2020, 10/10/2021   Influenza, Quadrivalent, Recombinant, Inj, Pf 10/23/2022   Influenza,inj,Quad PF,6+ Mos 10/04/2019, 10/04/2019   Influenza,inj,quad, With Preservative 09/23/2018   Moderna Sars-Covid-2 Vaccination 03/10/2020, 04/07/2020, 12/22/2020   Pneumococcal Conjugate-13 04/20/2021   Pneumococcal Polysaccharide-23 03/22/2019    Past Medical History:  Diagnosis Date   Anemia    Anxiety    CHF (congestive heart failure) (HCC)    Colon cancer (HCC)    treatment surgery   Complication of anesthesia    after first C- [place] "couldnt walk after", patient denies having a spinal   COPD  (chronic obstructive pulmonary disease) (HCC)    Coronary artery disease    Depression    DVT (deep venous thrombosis) (HCC)    Encounter for annual health examination 08/05/2023   ESRD on hemodialysis (HCC)    Hemo: MWF   History of blood transfusion 04/2018   Hypertension    07/07/18- no longer takes BP medications   Leucocytosis 04/28/2012   Meningitis    Pain in limb 07/30/2013   PE (pulmonary embolism)    Peripheral vascular disease (HCC)  Primary hypertension 11/15/2013   Restless legs    Shortness of breath    with exertion   Sleep apnea    SOB (shortness of breath) 03/03/2019   Vertigo     Tobacco History: Social History   Tobacco Use  Smoking Status Former   Current packs/day: 0.00   Average packs/day: 0.5 packs/day for 50.0 years (25.0 ttl pk-yrs)   Types: Cigarettes   Start date: 10/13/1972   Quit date: 10/13/2022   Years since quitting: 0.9  Smokeless Tobacco Former  Tobacco Comments   She is using a vape when she runs out of her cigarettes, 7/27 - down to 1-2 cigs a day   Counseling given: Not Answered Tobacco comments: She is using a vape when she runs out of her cigarettes, 7/27 - down to 1-2 cigs a day   Continue to not smoke  Outpatient Encounter Medications as of 09/09/2023  Medication Sig   acetaminophen (TYLENOL) 650 MG CR tablet Take 650 mg by mouth 3 (three) times daily as needed (arthritis).   albuterol (VENTOLIN HFA) 108 (90 Base) MCG/ACT inhaler Inhale 2 puffs into the lungs every 6 (six) hours as needed for wheezing or shortness of breath.   BIOTIN SL Place 10,000 mLs under the tongue daily.   ELIQUIS 5 MG TABS tablet TAKE ONE TABLET BY MOUTH TWICE DAILY   meclizine (ANTIVERT) 12.5 MG tablet Take 1 tablet (12.5 mg total) by mouth 3 (three) times daily as needed for dizziness.   multivitamin (RENA-VIT) TABS tablet Take 1 tablet by mouth daily. Patient is taking 1 tablet every night.   omeprazole (PRILOSEC) 20 MG capsule TAKE ONE CAPSULE BY  MOUTH ONCE DAILY   sodium zirconium cyclosilicate (LOKELMA) 10 g PACK packet Take 10 g by mouth daily. Patient takes on M,W,F (off dialysis days)   vitamin E 200 UNIT capsule Take 200 Units by mouth daily.   [DISCONTINUED] Tiotropium Bromide-Olodaterol (STIOLTO RESPIMAT) 2.5-2.5 MCG/ACT AERS Inhale 2 puffs into the lungs daily.   Tiotropium Bromide-Olodaterol (STIOLTO RESPIMAT) 2.5-2.5 MCG/ACT AERS Inhale 2 puffs into the lungs daily.   [DISCONTINUED] acetaminophen (TYLENOL) 325 MG tablet Take 650 mg by mouth daily as needed for headache. (Patient not taking: Reported on 09/09/2023)   [DISCONTINUED] Ascorbic Acid (VITAMIN C) 100 MG tablet Take 100 mg by mouth daily. (Patient not taking: Reported on 09/09/2023)   [DISCONTINUED] atorvastatin (LIPITOR) 40 MG tablet Take 1 tablet (40 mg total) by mouth daily. (Patient not taking: Reported on 07/29/2023)   [DISCONTINUED] cyclobenzaprine (FLEXERIL) 10 MG tablet Take 1 tablet (10 mg total) by mouth at bedtime. (Patient not taking: Reported on 09/09/2023)   [DISCONTINUED] diclofenac Sodium (VOLTAREN) 1 % GEL Apply 2 g topically daily as needed (pain). (Patient not taking: Reported on 09/09/2023)   [DISCONTINUED] fludrocortisone (FLORINEF) 0.1 MG tablet Take 1 tablet (0.1 mg total) by mouth every other day. Take in evening prior to dialysis days (Patient not taking: Reported on 09/09/2023)   [DISCONTINUED] fluticasone (FLONASE) 50 MCG/ACT nasal spray Place 2 sprays into both nostrils daily. (Patient not taking: Reported on 09/09/2023)   [DISCONTINUED] fluticasone-salmeterol (ADVAIR HFA) 115-21 MCG/ACT inhaler Inhale 2 puffs into the lungs 2 (two) times daily. (Patient not taking: Reported on 09/09/2023)   No facility-administered encounter medications on file as of 09/09/2023.     Review of Systems  Review of Systems  N/a Physical Exam  BP 132/74 (BP Location: Left Arm, Patient Position: Sitting, Cuff Size: Large)   Pulse (!) 104   Temp  98.4 F (36.9 C)  (Oral)   Ht 5\' 1"  (1.549 m)   Wt 200 lb 9.6 oz (91 kg)   SpO2 98%   BMI 37.90 kg/m   Wt Readings from Last 5 Encounters:  09/09/23 200 lb 9.6 oz (91 kg)  07/29/23 200 lb 3.2 oz (90.8 kg)  05/22/23 204 lb 4.8 oz (92.7 kg)  05/08/23 203 lb (92.1 kg)  04/17/23 203 lb 12.8 oz (92.4 kg)    BMI Readings from Last 5 Encounters:  09/09/23 37.90 kg/m  07/29/23 36.62 kg/m  05/22/23 37.37 kg/m  05/08/23 37.13 kg/m  04/17/23 37.28 kg/m     Physical Exam General: Sitting in chair, in NAD Eyes: EOMI, no icterus Neck: supple, no JVP appreciated Pulmonary: clear, NWOB on RA CV: RRR, warm Abdomen, ND, BS present MSK: No synovitis, no joint effusion Neuro: Normal gait, no weakness Psych: normal mood, full affect   Assessment & Plan:   DOE: Primarily related to fluid accumulation between HD sessions as she reports improvement after HD and gradual worsening the next day between HD sessions as well as worsening when she is less judicious with fluid restriction. Prior spirometry not consistent with COPD. Inhalers have not helped in the past.  Repeat spirometry consistent with air trapping, decreased DLCO likely in setting of emphysema.  Symptoms improved with addition of Stiolto.  Stiolto refilled today.  Tobacco abuse in remission: No longer smoking.  Recent low-dose CT scan 07/2023 lung RADS 2, 85-month follow-up.  Encouraged her to continue lung cancer screening via her PCP.   Return in about 6 months (around 03/08/2024) for f/u Dr. Judeth Horn.   Karren Burly, MD 09/09/2023

## 2023-09-10 DIAGNOSIS — N186 End stage renal disease: Secondary | ICD-10-CM | POA: Diagnosis not present

## 2023-09-10 DIAGNOSIS — N2581 Secondary hyperparathyroidism of renal origin: Secondary | ICD-10-CM | POA: Diagnosis not present

## 2023-09-10 DIAGNOSIS — Z992 Dependence on renal dialysis: Secondary | ICD-10-CM | POA: Diagnosis not present

## 2023-09-10 DIAGNOSIS — D689 Coagulation defect, unspecified: Secondary | ICD-10-CM | POA: Diagnosis not present

## 2023-09-10 DIAGNOSIS — E875 Hyperkalemia: Secondary | ICD-10-CM | POA: Diagnosis not present

## 2023-09-10 DIAGNOSIS — D509 Iron deficiency anemia, unspecified: Secondary | ICD-10-CM | POA: Diagnosis not present

## 2023-09-10 DIAGNOSIS — R52 Pain, unspecified: Secondary | ICD-10-CM | POA: Diagnosis not present

## 2023-09-11 ENCOUNTER — Ambulatory Visit: Payer: Self-pay

## 2023-09-11 NOTE — Patient Outreach (Signed)
  Care Coordination   Follow Up Visit Note   09/11/2023 Name: Whitney Walker MRN: [id number] DOB: 05/11/1956  Whitney Walker is a 67 y.o. year old female who sees Whitney Felts, FNP for primary care. I spoke with  Whitney Walker by phone today.  What matters to the patients health and wellness today?  Patient will continue to use her inhaler as prescribed for shortness of breath. She would like a list of pharmacies that deliver.     Goals Addressed               This Visit's Progress     Patient Stated     I will consider seeing a Pulmonologist for my COPD (pt-stated)   On track     Care Coordination Interventions: Evaluation of current treatment plan related to COPD and patient's adherence to plan as established by provider Determined patient completed her f/u with Dr. Judeth Horn for evaluation of DOE Review of patient status, including review of consultant's reports, relevant laboratory and other test results, and medications completed Reviewed and discussed with patient she is getting good effectiveness from the [facility] and will continue using this inhaler Provided instruction about proper use of medications used for management of COPD including inhalers Provided education about and advised patient to utilize infection prevention strategies to reduce risk of respiratory infection Mailed list of pharmacies that deliver and provide pill packaging     Interventions Today    Flowsheet Row Most Recent Value  Chronic Disease   Chronic disease during today's visit Chronic Obstructive Pulmonary Disease (COPD)  General Interventions   General Interventions Discussed/Reviewed General Interventions Discussed, General Interventions Reviewed, Doctor Visits  Doctor Visits Discussed/Reviewed Doctor Visits Reviewed, Doctor Visits Discussed, Specialist  Education Interventions   Education Provided Provided Education  Provided Verbal Education On Medication, When to see the  doctor  Pharmacy Interventions   Pharmacy Dicussed/Reviewed Pharmacy Topics Discussed, Pharmacy Topics Reviewed, Medications and their functions          SDOH assessments and interventions completed:  No     Care Coordination Interventions:  Yes, provided   Follow up plan: Follow up call scheduled for 10/21/23 @10 :30 AM    Encounter Outcome:  Patient Visit Completed

## 2023-09-11 NOTE — Patient Instructions (Addendum)
Visit Information  Thank you for taking time to visit with me today. Please don't hesitate to contact me if I can be of assistance to you.   Following are the goals we discussed today:   Goals Addressed               This Visit's Progress     Patient Stated     I will consider seeing a Pulmonologist for my COPD (pt-stated)   On track     Care Coordination Interventions: Evaluation of current treatment plan related to COPD and patient's adherence to plan as established by provider Determined patient completed her f/u with Dr. Judeth Horn for evaluation of DOE Review of patient status, including review of consultant's reports, relevant laboratory and other test results, and medications completed Reviewed and discussed with patient she is getting good effectiveness from the [facility] and will continue using this inhaler Provided instruction about proper use of medications used for management of COPD including inhalers Provided education about and advised patient to utilize infection prevention strategies to reduce risk of respiratory infection Mailed list of pharmacies that deliver and provide pill packaging         Our next appointment is by telephone on 10/21/23 at 10:30 AM  Please call the care guide team at [phone] if you need to cancel or reschedule your appointment.   If you are experiencing a Mental Health or Behavioral Health Crisis or need someone to talk to, please call 1-800-273-TALK (toll free, 24 hour hotline)  Patient verbalizes understanding of instructions and care plan provided today and agrees to view in MyChart. Active MyChart status and patient understanding of how to access instructions and care plan via MyChart confirmed with patient.     Delsa Sale RN BSN CCM [place]  [facility], [facility] Nurse Care Coordinator  Direct Dial: [phone] Website: [email or web address]

## 2023-09-12 DIAGNOSIS — N2581 Secondary hyperparathyroidism of renal origin: Secondary | ICD-10-CM | POA: Diagnosis not present

## 2023-09-12 DIAGNOSIS — N186 End stage renal disease: Secondary | ICD-10-CM | POA: Diagnosis not present

## 2023-09-12 DIAGNOSIS — E875 Hyperkalemia: Secondary | ICD-10-CM | POA: Diagnosis not present

## 2023-09-12 DIAGNOSIS — Z992 Dependence on renal dialysis: Secondary | ICD-10-CM | POA: Diagnosis not present

## 2023-09-12 DIAGNOSIS — R52 Pain, unspecified: Secondary | ICD-10-CM | POA: Diagnosis not present

## 2023-09-12 DIAGNOSIS — D509 Iron deficiency anemia, unspecified: Secondary | ICD-10-CM | POA: Diagnosis not present

## 2023-09-12 DIAGNOSIS — D689 Coagulation defect, unspecified: Secondary | ICD-10-CM | POA: Diagnosis not present

## 2023-09-15 DIAGNOSIS — D689 Coagulation defect, unspecified: Secondary | ICD-10-CM | POA: Diagnosis not present

## 2023-09-15 DIAGNOSIS — R52 Pain, unspecified: Secondary | ICD-10-CM | POA: Diagnosis not present

## 2023-09-15 DIAGNOSIS — E875 Hyperkalemia: Secondary | ICD-10-CM | POA: Diagnosis not present

## 2023-09-15 DIAGNOSIS — N2581 Secondary hyperparathyroidism of renal origin: Secondary | ICD-10-CM | POA: Diagnosis not present

## 2023-09-15 DIAGNOSIS — D509 Iron deficiency anemia, unspecified: Secondary | ICD-10-CM | POA: Diagnosis not present

## 2023-09-15 DIAGNOSIS — N186 End stage renal disease: Secondary | ICD-10-CM | POA: Diagnosis not present

## 2023-09-15 DIAGNOSIS — Z992 Dependence on renal dialysis: Secondary | ICD-10-CM | POA: Diagnosis not present

## 2023-09-17 DIAGNOSIS — N186 End stage renal disease: Secondary | ICD-10-CM | POA: Diagnosis not present

## 2023-09-17 DIAGNOSIS — Z992 Dependence on renal dialysis: Secondary | ICD-10-CM | POA: Diagnosis not present

## 2023-09-17 DIAGNOSIS — E875 Hyperkalemia: Secondary | ICD-10-CM | POA: Diagnosis not present

## 2023-09-17 DIAGNOSIS — D509 Iron deficiency anemia, unspecified: Secondary | ICD-10-CM | POA: Diagnosis not present

## 2023-09-17 DIAGNOSIS — N2581 Secondary hyperparathyroidism of renal origin: Secondary | ICD-10-CM | POA: Diagnosis not present

## 2023-09-17 DIAGNOSIS — R52 Pain, unspecified: Secondary | ICD-10-CM | POA: Diagnosis not present

## 2023-09-17 DIAGNOSIS — D689 Coagulation defect, unspecified: Secondary | ICD-10-CM | POA: Diagnosis not present

## 2023-09-18 ENCOUNTER — Other Ambulatory Visit: Payer: Self-pay

## 2023-09-18 ENCOUNTER — Telehealth: Payer: Self-pay | Admitting: Pulmonary Disease

## 2023-09-18 MED ORDER — MECLIZINE HCL 12.5 MG PO TABS: 12.5000 mg | ORAL_TABLET | Freq: Three times a day (TID) | ORAL | 1 refills | Status: AC | PRN

## 2023-09-18 MED ORDER — ACETAMINOPHEN ER 650 MG PO TBCR: 650.0000 mg | EXTENDED_RELEASE_TABLET | Freq: Three times a day (TID) | ORAL | 1 refills | Status: DC | PRN

## 2023-09-18 MED ORDER — APIXABAN 5 MG PO TABS: 5.0000 mg | ORAL_TABLET | Freq: Two times a day (BID) | ORAL | 2 refills | Status: DC

## 2023-09-18 MED ORDER — OMEPRAZOLE 20 MG PO CPDR: 20.0000 mg | DELAYED_RELEASE_CAPSULE | Freq: Every day | ORAL | 1 refills | Status: DC

## 2023-09-18 MED ORDER — ALBUTEROL SULFATE HFA 108 (90 BASE) MCG/ACT IN AERS: 2.0000 | INHALATION_SPRAY | Freq: Four times a day (QID) | RESPIRATORY_TRACT | 2 refills | Status: DC | PRN

## 2023-09-18 NOTE — Telephone Encounter (Signed)
Patient needs a refill on medication stioloto respamat 2.72mcg  Pharmacy [facility] [place] [address]

## 2023-09-19 DIAGNOSIS — D689 Coagulation defect, unspecified: Secondary | ICD-10-CM | POA: Diagnosis not present

## 2023-09-19 DIAGNOSIS — N2581 Secondary hyperparathyroidism of renal origin: Secondary | ICD-10-CM | POA: Diagnosis not present

## 2023-09-19 DIAGNOSIS — R52 Pain, unspecified: Secondary | ICD-10-CM | POA: Diagnosis not present

## 2023-09-19 DIAGNOSIS — D509 Iron deficiency anemia, unspecified: Secondary | ICD-10-CM | POA: Diagnosis not present

## 2023-09-19 DIAGNOSIS — E875 Hyperkalemia: Secondary | ICD-10-CM | POA: Diagnosis not present

## 2023-09-19 DIAGNOSIS — N186 End stage renal disease: Secondary | ICD-10-CM | POA: Diagnosis not present

## 2023-09-19 DIAGNOSIS — Z992 Dependence on renal dialysis: Secondary | ICD-10-CM | POA: Diagnosis not present

## 2023-09-19 MED ORDER — STIOLTO RESPIMAT 2.5-2.5 MCG/ACT IN AERS: 2.0000 | INHALATION_SPRAY | Freq: Every day | RESPIRATORY_TRACT | 11 refills | Status: DC

## 2023-09-19 NOTE — Telephone Encounter (Signed)
Stiolto script sent to [facility].  Patient aware.

## 2023-09-22 DIAGNOSIS — R52 Pain, unspecified: Secondary | ICD-10-CM | POA: Diagnosis not present

## 2023-09-22 DIAGNOSIS — D509 Iron deficiency anemia, unspecified: Secondary | ICD-10-CM | POA: Diagnosis not present

## 2023-09-22 DIAGNOSIS — N2581 Secondary hyperparathyroidism of renal origin: Secondary | ICD-10-CM | POA: Diagnosis not present

## 2023-09-22 DIAGNOSIS — E875 Hyperkalemia: Secondary | ICD-10-CM | POA: Diagnosis not present

## 2023-09-22 DIAGNOSIS — D689 Coagulation defect, unspecified: Secondary | ICD-10-CM | POA: Diagnosis not present

## 2023-09-22 DIAGNOSIS — Z992 Dependence on renal dialysis: Secondary | ICD-10-CM | POA: Diagnosis not present

## 2023-09-22 DIAGNOSIS — N186 End stage renal disease: Secondary | ICD-10-CM | POA: Diagnosis not present

## 2023-09-24 DIAGNOSIS — R52 Pain, unspecified: Secondary | ICD-10-CM | POA: Diagnosis not present

## 2023-09-24 DIAGNOSIS — D689 Coagulation defect, unspecified: Secondary | ICD-10-CM | POA: Diagnosis not present

## 2023-09-24 DIAGNOSIS — N2581 Secondary hyperparathyroidism of renal origin: Secondary | ICD-10-CM | POA: Diagnosis not present

## 2023-09-24 DIAGNOSIS — E875 Hyperkalemia: Secondary | ICD-10-CM | POA: Diagnosis not present

## 2023-09-24 DIAGNOSIS — D509 Iron deficiency anemia, unspecified: Secondary | ICD-10-CM | POA: Diagnosis not present

## 2023-09-24 DIAGNOSIS — Z992 Dependence on renal dialysis: Secondary | ICD-10-CM | POA: Diagnosis not present

## 2023-09-24 DIAGNOSIS — N186 End stage renal disease: Secondary | ICD-10-CM | POA: Diagnosis not present

## 2023-09-26 DIAGNOSIS — E875 Hyperkalemia: Secondary | ICD-10-CM | POA: Diagnosis not present

## 2023-09-26 DIAGNOSIS — N2581 Secondary hyperparathyroidism of renal origin: Secondary | ICD-10-CM | POA: Diagnosis not present

## 2023-09-26 DIAGNOSIS — N186 End stage renal disease: Secondary | ICD-10-CM | POA: Diagnosis not present

## 2023-09-26 DIAGNOSIS — Z992 Dependence on renal dialysis: Secondary | ICD-10-CM | POA: Diagnosis not present

## 2023-09-26 DIAGNOSIS — D689 Coagulation defect, unspecified: Secondary | ICD-10-CM | POA: Diagnosis not present

## 2023-09-26 DIAGNOSIS — D509 Iron deficiency anemia, unspecified: Secondary | ICD-10-CM | POA: Diagnosis not present

## 2023-09-26 DIAGNOSIS — R52 Pain, unspecified: Secondary | ICD-10-CM | POA: Diagnosis not present

## 2023-09-29 DIAGNOSIS — I129 Hypertensive chronic kidney disease with stage 1 through stage 4 chronic kidney disease, or unspecified chronic kidney disease: Secondary | ICD-10-CM | POA: Diagnosis not present

## 2023-09-29 DIAGNOSIS — N186 End stage renal disease: Secondary | ICD-10-CM | POA: Diagnosis not present

## 2023-09-29 DIAGNOSIS — N2581 Secondary hyperparathyroidism of renal origin: Secondary | ICD-10-CM | POA: Diagnosis not present

## 2023-09-29 DIAGNOSIS — R52 Pain, unspecified: Secondary | ICD-10-CM | POA: Diagnosis not present

## 2023-09-29 DIAGNOSIS — Z992 Dependence on renal dialysis: Secondary | ICD-10-CM | POA: Diagnosis not present

## 2023-09-29 DIAGNOSIS — E875 Hyperkalemia: Secondary | ICD-10-CM | POA: Diagnosis not present

## 2023-09-29 DIAGNOSIS — D509 Iron deficiency anemia, unspecified: Secondary | ICD-10-CM | POA: Diagnosis not present

## 2023-09-29 DIAGNOSIS — D689 Coagulation defect, unspecified: Secondary | ICD-10-CM | POA: Diagnosis not present

## 2023-10-01 DIAGNOSIS — N186 End stage renal disease: Secondary | ICD-10-CM | POA: Diagnosis not present

## 2023-10-01 DIAGNOSIS — Z992 Dependence on renal dialysis: Secondary | ICD-10-CM | POA: Diagnosis not present

## 2023-10-01 DIAGNOSIS — D509 Iron deficiency anemia, unspecified: Secondary | ICD-10-CM | POA: Diagnosis not present

## 2023-10-01 DIAGNOSIS — D689 Coagulation defect, unspecified: Secondary | ICD-10-CM | POA: Diagnosis not present

## 2023-10-01 DIAGNOSIS — N2581 Secondary hyperparathyroidism of renal origin: Secondary | ICD-10-CM | POA: Diagnosis not present

## 2023-10-01 DIAGNOSIS — E875 Hyperkalemia: Secondary | ICD-10-CM | POA: Diagnosis not present

## 2023-10-01 DIAGNOSIS — Z23 Encounter for immunization: Secondary | ICD-10-CM | POA: Diagnosis not present

## 2023-10-01 DIAGNOSIS — L299 Pruritus, unspecified: Secondary | ICD-10-CM | POA: Diagnosis not present

## 2023-10-03 DIAGNOSIS — N186 End stage renal disease: Secondary | ICD-10-CM | POA: Diagnosis not present

## 2023-10-03 DIAGNOSIS — Z992 Dependence on renal dialysis: Secondary | ICD-10-CM | POA: Diagnosis not present

## 2023-10-03 DIAGNOSIS — L299 Pruritus, unspecified: Secondary | ICD-10-CM | POA: Diagnosis not present

## 2023-10-03 DIAGNOSIS — N2581 Secondary hyperparathyroidism of renal origin: Secondary | ICD-10-CM | POA: Diagnosis not present

## 2023-10-03 DIAGNOSIS — D689 Coagulation defect, unspecified: Secondary | ICD-10-CM | POA: Diagnosis not present

## 2023-10-03 DIAGNOSIS — D509 Iron deficiency anemia, unspecified: Secondary | ICD-10-CM | POA: Diagnosis not present

## 2023-10-03 DIAGNOSIS — E875 Hyperkalemia: Secondary | ICD-10-CM | POA: Diagnosis not present

## 2023-10-03 DIAGNOSIS — Z23 Encounter for immunization: Secondary | ICD-10-CM | POA: Diagnosis not present

## 2023-10-06 DIAGNOSIS — E875 Hyperkalemia: Secondary | ICD-10-CM | POA: Diagnosis not present

## 2023-10-06 DIAGNOSIS — Z992 Dependence on renal dialysis: Secondary | ICD-10-CM | POA: Diagnosis not present

## 2023-10-06 DIAGNOSIS — D689 Coagulation defect, unspecified: Secondary | ICD-10-CM | POA: Diagnosis not present

## 2023-10-06 DIAGNOSIS — D509 Iron deficiency anemia, unspecified: Secondary | ICD-10-CM | POA: Diagnosis not present

## 2023-10-06 DIAGNOSIS — N186 End stage renal disease: Secondary | ICD-10-CM | POA: Diagnosis not present

## 2023-10-06 DIAGNOSIS — L299 Pruritus, unspecified: Secondary | ICD-10-CM | POA: Diagnosis not present

## 2023-10-06 DIAGNOSIS — N2581 Secondary hyperparathyroidism of renal origin: Secondary | ICD-10-CM | POA: Diagnosis not present

## 2023-10-06 DIAGNOSIS — Z23 Encounter for immunization: Secondary | ICD-10-CM | POA: Diagnosis not present

## 2023-10-08 DIAGNOSIS — N2581 Secondary hyperparathyroidism of renal origin: Secondary | ICD-10-CM | POA: Diagnosis not present

## 2023-10-08 DIAGNOSIS — N186 End stage renal disease: Secondary | ICD-10-CM | POA: Diagnosis not present

## 2023-10-08 DIAGNOSIS — Z23 Encounter for immunization: Secondary | ICD-10-CM | POA: Diagnosis not present

## 2023-10-08 DIAGNOSIS — E875 Hyperkalemia: Secondary | ICD-10-CM | POA: Diagnosis not present

## 2023-10-08 DIAGNOSIS — D509 Iron deficiency anemia, unspecified: Secondary | ICD-10-CM | POA: Diagnosis not present

## 2023-10-08 DIAGNOSIS — L299 Pruritus, unspecified: Secondary | ICD-10-CM | POA: Diagnosis not present

## 2023-10-08 DIAGNOSIS — D689 Coagulation defect, unspecified: Secondary | ICD-10-CM | POA: Diagnosis not present

## 2023-10-08 DIAGNOSIS — Z992 Dependence on renal dialysis: Secondary | ICD-10-CM | POA: Diagnosis not present

## 2023-10-10 DIAGNOSIS — D509 Iron deficiency anemia, unspecified: Secondary | ICD-10-CM | POA: Diagnosis not present

## 2023-10-10 DIAGNOSIS — N186 End stage renal disease: Secondary | ICD-10-CM | POA: Diagnosis not present

## 2023-10-10 DIAGNOSIS — L299 Pruritus, unspecified: Secondary | ICD-10-CM | POA: Diagnosis not present

## 2023-10-10 DIAGNOSIS — N2581 Secondary hyperparathyroidism of renal origin: Secondary | ICD-10-CM | POA: Diagnosis not present

## 2023-10-10 DIAGNOSIS — Z992 Dependence on renal dialysis: Secondary | ICD-10-CM | POA: Diagnosis not present

## 2023-10-10 DIAGNOSIS — E875 Hyperkalemia: Secondary | ICD-10-CM | POA: Diagnosis not present

## 2023-10-10 DIAGNOSIS — Z23 Encounter for immunization: Secondary | ICD-10-CM | POA: Diagnosis not present

## 2023-10-10 DIAGNOSIS — D689 Coagulation defect, unspecified: Secondary | ICD-10-CM | POA: Diagnosis not present

## 2023-10-13 DIAGNOSIS — E875 Hyperkalemia: Secondary | ICD-10-CM | POA: Diagnosis not present

## 2023-10-13 DIAGNOSIS — D509 Iron deficiency anemia, unspecified: Secondary | ICD-10-CM | POA: Diagnosis not present

## 2023-10-13 DIAGNOSIS — Z992 Dependence on renal dialysis: Secondary | ICD-10-CM | POA: Diagnosis not present

## 2023-10-13 DIAGNOSIS — Z23 Encounter for immunization: Secondary | ICD-10-CM | POA: Diagnosis not present

## 2023-10-13 DIAGNOSIS — N186 End stage renal disease: Secondary | ICD-10-CM | POA: Diagnosis not present

## 2023-10-13 DIAGNOSIS — D689 Coagulation defect, unspecified: Secondary | ICD-10-CM | POA: Diagnosis not present

## 2023-10-13 DIAGNOSIS — N2581 Secondary hyperparathyroidism of renal origin: Secondary | ICD-10-CM | POA: Diagnosis not present

## 2023-10-13 DIAGNOSIS — L299 Pruritus, unspecified: Secondary | ICD-10-CM | POA: Diagnosis not present

## 2023-10-15 DIAGNOSIS — D689 Coagulation defect, unspecified: Secondary | ICD-10-CM | POA: Diagnosis not present

## 2023-10-15 DIAGNOSIS — E875 Hyperkalemia: Secondary | ICD-10-CM | POA: Diagnosis not present

## 2023-10-15 DIAGNOSIS — Z23 Encounter for immunization: Secondary | ICD-10-CM | POA: Diagnosis not present

## 2023-10-15 DIAGNOSIS — N186 End stage renal disease: Secondary | ICD-10-CM | POA: Diagnosis not present

## 2023-10-15 DIAGNOSIS — L299 Pruritus, unspecified: Secondary | ICD-10-CM | POA: Diagnosis not present

## 2023-10-15 DIAGNOSIS — Z992 Dependence on renal dialysis: Secondary | ICD-10-CM | POA: Diagnosis not present

## 2023-10-15 DIAGNOSIS — D509 Iron deficiency anemia, unspecified: Secondary | ICD-10-CM | POA: Diagnosis not present

## 2023-10-15 DIAGNOSIS — N2581 Secondary hyperparathyroidism of renal origin: Secondary | ICD-10-CM | POA: Diagnosis not present

## 2023-10-16 ENCOUNTER — Ambulatory Visit: Payer: Self-pay | Admitting: Cardiology

## 2023-10-17 DIAGNOSIS — N186 End stage renal disease: Secondary | ICD-10-CM | POA: Diagnosis not present

## 2023-10-17 DIAGNOSIS — D509 Iron deficiency anemia, unspecified: Secondary | ICD-10-CM | POA: Diagnosis not present

## 2023-10-17 DIAGNOSIS — L299 Pruritus, unspecified: Secondary | ICD-10-CM | POA: Diagnosis not present

## 2023-10-17 DIAGNOSIS — Z23 Encounter for immunization: Secondary | ICD-10-CM | POA: Diagnosis not present

## 2023-10-17 DIAGNOSIS — Z992 Dependence on renal dialysis: Secondary | ICD-10-CM | POA: Diagnosis not present

## 2023-10-17 DIAGNOSIS — E875 Hyperkalemia: Secondary | ICD-10-CM | POA: Diagnosis not present

## 2023-10-17 DIAGNOSIS — D689 Coagulation defect, unspecified: Secondary | ICD-10-CM | POA: Diagnosis not present

## 2023-10-17 DIAGNOSIS — N2581 Secondary hyperparathyroidism of renal origin: Secondary | ICD-10-CM | POA: Diagnosis not present

## 2023-10-20 DIAGNOSIS — N2581 Secondary hyperparathyroidism of renal origin: Secondary | ICD-10-CM | POA: Diagnosis not present

## 2023-10-20 DIAGNOSIS — D509 Iron deficiency anemia, unspecified: Secondary | ICD-10-CM | POA: Diagnosis not present

## 2023-10-20 DIAGNOSIS — Z23 Encounter for immunization: Secondary | ICD-10-CM | POA: Diagnosis not present

## 2023-10-20 DIAGNOSIS — D689 Coagulation defect, unspecified: Secondary | ICD-10-CM | POA: Diagnosis not present

## 2023-10-20 DIAGNOSIS — N186 End stage renal disease: Secondary | ICD-10-CM | POA: Diagnosis not present

## 2023-10-20 DIAGNOSIS — L299 Pruritus, unspecified: Secondary | ICD-10-CM | POA: Diagnosis not present

## 2023-10-20 DIAGNOSIS — Z992 Dependence on renal dialysis: Secondary | ICD-10-CM | POA: Diagnosis not present

## 2023-10-20 DIAGNOSIS — E875 Hyperkalemia: Secondary | ICD-10-CM | POA: Diagnosis not present

## 2023-10-21 ENCOUNTER — Ambulatory Visit: Payer: Self-pay

## 2023-10-21 NOTE — Patient Outreach (Signed)
Care Coordination   Follow Up Visit Note   10/21/2023 Name: Diondria Bivona MRN: [id number] DOB: 11-27-1956  Shevaun Bachman is a 67 y.o. year old female who sees Arnette Felts, FNP for primary care. I spoke with  [name] by phone today.  What matters to the patients health and wellness today?  Patient would like to keep her breathing under good control without complications or hospitalization.     Goals Addressed               This Visit's Progress     Patient Stated     I will consider seeing a Pulmonologist for my COPD (pt-stated)   On track     Care Coordination Interventions: Evaluation of current treatment plan related to COPD and patient's adherence to plan as established by provider Reviewed and discussed with patient she continues to get good effectiveness from the [facility] and will continue using this inhaler Provided instruction about proper use of medications used for management of COPD including inhalers Provided education about and advised patient to utilize infection prevention strategies to reduce risk of respiratory infection Mailed printed educational material related to COPD Management Tool    Interventions Today    Flowsheet Row Most Recent Value  Chronic Disease   Chronic disease during today's visit Chronic Obstructive Pulmonary Disease (COPD), Chronic Kidney Disease/End Stage Renal Disease (ESRD)  General Interventions   General Interventions Discussed/Reviewed General Interventions Discussed, General Interventions Reviewed, Doctor Visits, Labs, Vaccines  Vaccines Flu  Doctor Visits Discussed/Reviewed Doctor Visits Discussed, Doctor Visits Reviewed, Specialist  Education Interventions   Education Provided Provided Education  Provided Verbal Education On Medication, Nutrition, When to see the doctor  Nutrition Interventions   Nutrition Discussed/Reviewed Nutrition Discussed, Nutrition Reviewed  Pharmacy Interventions   Pharmacy  Dicussed/Reviewed Pharmacy Topics Discussed, Pharmacy Topics Reviewed, Medications and their functions          SDOH assessments and interventions completed:  No     Care Coordination Interventions:  Yes, provided   Follow up plan: Follow up call scheduled for 11/18/23 @1 :00 PM     Encounter Outcome:  Patient Visit Completed

## 2023-10-21 NOTE — Patient Instructions (Signed)
Visit Information  Thank you for taking time to visit with me today. Please don't hesitate to contact me if I can be of assistance to you.   Following are the goals we discussed today:   Goals Addressed               This Visit's Progress     Patient Stated     I will consider seeing a Pulmonologist for my COPD (pt-stated)   On track     Care Coordination Interventions: Evaluation of current treatment plan related to COPD and patient's adherence to plan as established by provider Reviewed and discussed with patient she continues to get good effectiveness from the [facility] and will continue using this inhaler Provided instruction about proper use of medications used for management of COPD including inhalers Provided education about and advised patient to utilize infection prevention strategies to reduce risk of respiratory infection Mailed printed educational material related to COPD Management Tool         Our next appointment is by telephone on 11/18/23 at 1:00 PM  Please call the care guide team at [phone] if you need to cancel or reschedule your appointment.   If you are experiencing a Mental Health or Behavioral Health Crisis or need someone to talk to, please call 1-800-273-TALK (toll free, 24 hour hotline)  Patient verbalizes understanding of instructions and care plan provided today and agrees to view in MyChart. Active MyChart status and patient understanding of how to access instructions and care plan via MyChart confirmed with patient.     Delsa Sale RN BSN CCM [place]  [facility], [facility] Nurse Care Coordinator  Direct Dial: [phone] Website: [email or web address]

## 2023-10-22 DIAGNOSIS — N186 End stage renal disease: Secondary | ICD-10-CM | POA: Diagnosis not present

## 2023-10-22 DIAGNOSIS — E875 Hyperkalemia: Secondary | ICD-10-CM | POA: Diagnosis not present

## 2023-10-22 DIAGNOSIS — D509 Iron deficiency anemia, unspecified: Secondary | ICD-10-CM | POA: Diagnosis not present

## 2023-10-22 DIAGNOSIS — Z23 Encounter for immunization: Secondary | ICD-10-CM | POA: Diagnosis not present

## 2023-10-22 DIAGNOSIS — L299 Pruritus, unspecified: Secondary | ICD-10-CM | POA: Diagnosis not present

## 2023-10-22 DIAGNOSIS — D689 Coagulation defect, unspecified: Secondary | ICD-10-CM | POA: Diagnosis not present

## 2023-10-22 DIAGNOSIS — Z992 Dependence on renal dialysis: Secondary | ICD-10-CM | POA: Diagnosis not present

## 2023-10-22 DIAGNOSIS — N2581 Secondary hyperparathyroidism of renal origin: Secondary | ICD-10-CM | POA: Diagnosis not present

## 2023-10-24 DIAGNOSIS — D689 Coagulation defect, unspecified: Secondary | ICD-10-CM | POA: Diagnosis not present

## 2023-10-24 DIAGNOSIS — L299 Pruritus, unspecified: Secondary | ICD-10-CM | POA: Diagnosis not present

## 2023-10-24 DIAGNOSIS — N2581 Secondary hyperparathyroidism of renal origin: Secondary | ICD-10-CM | POA: Diagnosis not present

## 2023-10-24 DIAGNOSIS — E875 Hyperkalemia: Secondary | ICD-10-CM | POA: Diagnosis not present

## 2023-10-24 DIAGNOSIS — Z992 Dependence on renal dialysis: Secondary | ICD-10-CM | POA: Diagnosis not present

## 2023-10-24 DIAGNOSIS — N186 End stage renal disease: Secondary | ICD-10-CM | POA: Diagnosis not present

## 2023-10-24 DIAGNOSIS — Z23 Encounter for immunization: Secondary | ICD-10-CM | POA: Diagnosis not present

## 2023-10-24 DIAGNOSIS — D509 Iron deficiency anemia, unspecified: Secondary | ICD-10-CM | POA: Diagnosis not present

## 2023-10-27 DIAGNOSIS — Z23 Encounter for immunization: Secondary | ICD-10-CM | POA: Diagnosis not present

## 2023-10-27 DIAGNOSIS — E875 Hyperkalemia: Secondary | ICD-10-CM | POA: Diagnosis not present

## 2023-10-27 DIAGNOSIS — N186 End stage renal disease: Secondary | ICD-10-CM | POA: Diagnosis not present

## 2023-10-27 DIAGNOSIS — D509 Iron deficiency anemia, unspecified: Secondary | ICD-10-CM | POA: Diagnosis not present

## 2023-10-27 DIAGNOSIS — N2581 Secondary hyperparathyroidism of renal origin: Secondary | ICD-10-CM | POA: Diagnosis not present

## 2023-10-27 DIAGNOSIS — D689 Coagulation defect, unspecified: Secondary | ICD-10-CM | POA: Diagnosis not present

## 2023-10-27 DIAGNOSIS — L299 Pruritus, unspecified: Secondary | ICD-10-CM | POA: Diagnosis not present

## 2023-10-27 DIAGNOSIS — Z992 Dependence on renal dialysis: Secondary | ICD-10-CM | POA: Diagnosis not present

## 2023-10-29 DIAGNOSIS — E875 Hyperkalemia: Secondary | ICD-10-CM | POA: Diagnosis not present

## 2023-10-29 DIAGNOSIS — L299 Pruritus, unspecified: Secondary | ICD-10-CM | POA: Diagnosis not present

## 2023-10-29 DIAGNOSIS — Z23 Encounter for immunization: Secondary | ICD-10-CM | POA: Diagnosis not present

## 2023-10-29 DIAGNOSIS — D509 Iron deficiency anemia, unspecified: Secondary | ICD-10-CM | POA: Diagnosis not present

## 2023-10-29 DIAGNOSIS — N2581 Secondary hyperparathyroidism of renal origin: Secondary | ICD-10-CM | POA: Diagnosis not present

## 2023-10-29 DIAGNOSIS — D689 Coagulation defect, unspecified: Secondary | ICD-10-CM | POA: Diagnosis not present

## 2023-10-29 DIAGNOSIS — N186 End stage renal disease: Secondary | ICD-10-CM | POA: Diagnosis not present

## 2023-10-29 DIAGNOSIS — Z992 Dependence on renal dialysis: Secondary | ICD-10-CM | POA: Diagnosis not present

## 2023-10-30 DIAGNOSIS — Z992 Dependence on renal dialysis: Secondary | ICD-10-CM | POA: Diagnosis not present

## 2023-10-30 DIAGNOSIS — I129 Hypertensive chronic kidney disease with stage 1 through stage 4 chronic kidney disease, or unspecified chronic kidney disease: Secondary | ICD-10-CM | POA: Diagnosis not present

## 2023-10-30 DIAGNOSIS — N186 End stage renal disease: Secondary | ICD-10-CM | POA: Diagnosis not present

## 2023-10-31 DIAGNOSIS — D509 Iron deficiency anemia, unspecified: Secondary | ICD-10-CM | POA: Diagnosis not present

## 2023-10-31 DIAGNOSIS — N2581 Secondary hyperparathyroidism of renal origin: Secondary | ICD-10-CM | POA: Diagnosis not present

## 2023-10-31 DIAGNOSIS — Z992 Dependence on renal dialysis: Secondary | ICD-10-CM | POA: Diagnosis not present

## 2023-10-31 DIAGNOSIS — E875 Hyperkalemia: Secondary | ICD-10-CM | POA: Diagnosis not present

## 2023-10-31 DIAGNOSIS — N186 End stage renal disease: Secondary | ICD-10-CM | POA: Diagnosis not present

## 2023-10-31 DIAGNOSIS — D689 Coagulation defect, unspecified: Secondary | ICD-10-CM | POA: Diagnosis not present

## 2023-11-03 DIAGNOSIS — D509 Iron deficiency anemia, unspecified: Secondary | ICD-10-CM | POA: Diagnosis not present

## 2023-11-03 DIAGNOSIS — Z992 Dependence on renal dialysis: Secondary | ICD-10-CM | POA: Diagnosis not present

## 2023-11-03 DIAGNOSIS — N2581 Secondary hyperparathyroidism of renal origin: Secondary | ICD-10-CM | POA: Diagnosis not present

## 2023-11-03 DIAGNOSIS — D689 Coagulation defect, unspecified: Secondary | ICD-10-CM | POA: Diagnosis not present

## 2023-11-03 DIAGNOSIS — E875 Hyperkalemia: Secondary | ICD-10-CM | POA: Diagnosis not present

## 2023-11-03 DIAGNOSIS — N186 End stage renal disease: Secondary | ICD-10-CM | POA: Diagnosis not present

## 2023-11-05 DIAGNOSIS — N2581 Secondary hyperparathyroidism of renal origin: Secondary | ICD-10-CM | POA: Diagnosis not present

## 2023-11-05 DIAGNOSIS — E875 Hyperkalemia: Secondary | ICD-10-CM | POA: Diagnosis not present

## 2023-11-05 DIAGNOSIS — N186 End stage renal disease: Secondary | ICD-10-CM | POA: Diagnosis not present

## 2023-11-05 DIAGNOSIS — Z992 Dependence on renal dialysis: Secondary | ICD-10-CM | POA: Diagnosis not present

## 2023-11-05 DIAGNOSIS — D689 Coagulation defect, unspecified: Secondary | ICD-10-CM | POA: Diagnosis not present

## 2023-11-05 DIAGNOSIS — D509 Iron deficiency anemia, unspecified: Secondary | ICD-10-CM | POA: Diagnosis not present

## 2023-11-07 DIAGNOSIS — N186 End stage renal disease: Secondary | ICD-10-CM | POA: Diagnosis not present

## 2023-11-07 DIAGNOSIS — D509 Iron deficiency anemia, unspecified: Secondary | ICD-10-CM | POA: Diagnosis not present

## 2023-11-07 DIAGNOSIS — N2581 Secondary hyperparathyroidism of renal origin: Secondary | ICD-10-CM | POA: Diagnosis not present

## 2023-11-07 DIAGNOSIS — E875 Hyperkalemia: Secondary | ICD-10-CM | POA: Diagnosis not present

## 2023-11-07 DIAGNOSIS — Z992 Dependence on renal dialysis: Secondary | ICD-10-CM | POA: Diagnosis not present

## 2023-11-07 DIAGNOSIS — D689 Coagulation defect, unspecified: Secondary | ICD-10-CM | POA: Diagnosis not present

## 2023-11-10 DIAGNOSIS — D689 Coagulation defect, unspecified: Secondary | ICD-10-CM | POA: Diagnosis not present

## 2023-11-10 DIAGNOSIS — D509 Iron deficiency anemia, unspecified: Secondary | ICD-10-CM | POA: Diagnosis not present

## 2023-11-10 DIAGNOSIS — E875 Hyperkalemia: Secondary | ICD-10-CM | POA: Diagnosis not present

## 2023-11-10 DIAGNOSIS — Z992 Dependence on renal dialysis: Secondary | ICD-10-CM | POA: Diagnosis not present

## 2023-11-10 DIAGNOSIS — N186 End stage renal disease: Secondary | ICD-10-CM | POA: Diagnosis not present

## 2023-11-10 DIAGNOSIS — N2581 Secondary hyperparathyroidism of renal origin: Secondary | ICD-10-CM | POA: Diagnosis not present

## 2023-11-12 DIAGNOSIS — N186 End stage renal disease: Secondary | ICD-10-CM | POA: Diagnosis not present

## 2023-11-12 DIAGNOSIS — D509 Iron deficiency anemia, unspecified: Secondary | ICD-10-CM | POA: Diagnosis not present

## 2023-11-12 DIAGNOSIS — Z992 Dependence on renal dialysis: Secondary | ICD-10-CM | POA: Diagnosis not present

## 2023-11-12 DIAGNOSIS — D689 Coagulation defect, unspecified: Secondary | ICD-10-CM | POA: Diagnosis not present

## 2023-11-12 DIAGNOSIS — E875 Hyperkalemia: Secondary | ICD-10-CM | POA: Diagnosis not present

## 2023-11-12 DIAGNOSIS — N2581 Secondary hyperparathyroidism of renal origin: Secondary | ICD-10-CM | POA: Diagnosis not present

## 2023-11-14 DIAGNOSIS — N2581 Secondary hyperparathyroidism of renal origin: Secondary | ICD-10-CM | POA: Diagnosis not present

## 2023-11-14 DIAGNOSIS — D509 Iron deficiency anemia, unspecified: Secondary | ICD-10-CM | POA: Diagnosis not present

## 2023-11-14 DIAGNOSIS — Z992 Dependence on renal dialysis: Secondary | ICD-10-CM | POA: Diagnosis not present

## 2023-11-14 DIAGNOSIS — D689 Coagulation defect, unspecified: Secondary | ICD-10-CM | POA: Diagnosis not present

## 2023-11-14 DIAGNOSIS — E875 Hyperkalemia: Secondary | ICD-10-CM | POA: Diagnosis not present

## 2023-11-14 DIAGNOSIS — N186 End stage renal disease: Secondary | ICD-10-CM | POA: Diagnosis not present

## 2023-11-17 DIAGNOSIS — Z992 Dependence on renal dialysis: Secondary | ICD-10-CM | POA: Diagnosis not present

## 2023-11-17 DIAGNOSIS — N2581 Secondary hyperparathyroidism of renal origin: Secondary | ICD-10-CM | POA: Diagnosis not present

## 2023-11-17 DIAGNOSIS — N186 End stage renal disease: Secondary | ICD-10-CM | POA: Diagnosis not present

## 2023-11-17 DIAGNOSIS — D689 Coagulation defect, unspecified: Secondary | ICD-10-CM | POA: Diagnosis not present

## 2023-11-17 DIAGNOSIS — E875 Hyperkalemia: Secondary | ICD-10-CM | POA: Diagnosis not present

## 2023-11-17 DIAGNOSIS — D509 Iron deficiency anemia, unspecified: Secondary | ICD-10-CM | POA: Diagnosis not present

## 2023-11-18 ENCOUNTER — Ambulatory Visit: Payer: Self-pay

## 2023-11-19 DIAGNOSIS — E875 Hyperkalemia: Secondary | ICD-10-CM | POA: Diagnosis not present

## 2023-11-19 DIAGNOSIS — D689 Coagulation defect, unspecified: Secondary | ICD-10-CM | POA: Diagnosis not present

## 2023-11-19 DIAGNOSIS — N2581 Secondary hyperparathyroidism of renal origin: Secondary | ICD-10-CM | POA: Diagnosis not present

## 2023-11-19 DIAGNOSIS — Z992 Dependence on renal dialysis: Secondary | ICD-10-CM | POA: Diagnosis not present

## 2023-11-19 DIAGNOSIS — D509 Iron deficiency anemia, unspecified: Secondary | ICD-10-CM | POA: Diagnosis not present

## 2023-11-19 DIAGNOSIS — N186 End stage renal disease: Secondary | ICD-10-CM | POA: Diagnosis not present

## 2023-11-19 NOTE — Patient Outreach (Signed)
  Care Coordination   Follow Up Visit Note   11/19/2023 Name: Whitney Walker MRN: [id number] DOB: [birth year]  Whitney Walker is a 67 y.o. year old female who sees Arnette Felts, FNP for primary care. I spoke with  Whitney Walker by phone today.  What matters to the patients health and wellness today?  Patient would like to stay healthy throughout the winter months and keep her breathing under good control.     Goals Addressed               This Visit's Progress     Patient Stated     I will consider seeing a Pulmonologist for my COPD (pt-stated)   On track     Care Coordination Interventions: Evaluation of current treatment plan related to COPD and patient's adherence to plan as established by provider Advised patient to track and manage COPD triggers Advised patient to self assesses COPD action plan zone and make appointment with provider if in the yellow zone for 48 hours without improvement Provided education about and advised patient to utilize infection prevention strategies to reduce risk of respiratory infection Updated patient's immunization record, educated patient about the basic disease process related to RSV, educated patient about the CDC guidelines regarding RSV vaccination recommendations  Reviewed patient's next scheduled PCP visit with Arnette Felts, FNP scheduled for 11/25/23 @2 :20 PM Patient will continue to use her inhalers exactly as prescribed Patient will notify her doctor promptly of new symptoms or concerns and or of any symptoms suggestive of acute infection  Patient will discuss RSV vaccination recommendations during her next scheduled visit with PCP Patient will utilize infection precautions as discussed  Patient will continue to work with nurse care coordinator for chronic disease management and care coordination needs     Interventions Today    Flowsheet Row Most Recent Value  Chronic Disease   Chronic disease during today's visit  Chronic Kidney Disease/End Stage Renal Disease (ESRD), Chronic Obstructive Pulmonary Disease (COPD), Other  [Anemia]  General Interventions   General Interventions Discussed/Reviewed General Interventions Discussed, General Interventions Reviewed, Doctor Visits, Vaccines, Labs  Vaccines RSV, Flu, COVID-19  Doctor Visits Discussed/Reviewed Doctor Visits Discussed, Doctor Visits Reviewed, PCP  Education Interventions   Education Provided Provided Education  Provided Verbal Education On When to see the doctor, Labs, Nutrition  Labs Reviewed --  [HCT/HGB]  Nutrition Interventions   Nutrition Discussed/Reviewed Nutrition Discussed, Nutrition Reviewed, Increasing proteins  [renal]          SDOH assessments and interventions completed:  No     Care Coordination Interventions:  Yes, provided   Follow up plan: Follow up call scheduled for 12/16/23 @12 :00 PM    Encounter Outcome:  Patient Visit Completed

## 2023-11-19 NOTE — Patient Instructions (Signed)
Visit Information  Thank you for taking time to visit with me today. Please don't hesitate to contact me if I can be of assistance to you.   Following are the goals we discussed today:   Goals Addressed               This Visit's Progress     Patient Stated     I will consider seeing a Pulmonologist for my COPD (pt-stated)   On track     Care Coordination Interventions: Evaluation of current treatment plan related to COPD and patient's adherence to plan as established by provider Advised patient to track and manage COPD triggers Advised patient to self assesses COPD action plan zone and make appointment with provider if in the yellow zone for 48 hours without improvement Provided education about and advised patient to utilize infection prevention strategies to reduce risk of respiratory infection Updated patient's immunization record, educated patient about the basic disease process related to RSV, educated patient about the CDC guidelines regarding RSV vaccination recommendations  Reviewed patient's next scheduled PCP visit with Arnette Felts, FNP scheduled for 11/25/23 @2 :20 PM Patient will continue to use her inhalers exactly as prescribed Patient will notify her doctor promptly of new symptoms or concerns and or of any symptoms suggestive of acute infection  Patient will discuss RSV vaccination recommendations during her next scheduled visit with PCP Patient will utilize infection precautions as discussed  Patient will continue to work with nurse care coordinator for chronic disease management and care coordination needs        Our next appointment is by telephone on 12/16/23 at 12:00 PM  Please call the care guide team at [phone] if you need to cancel or reschedule your appointment.   If you are experiencing a Mental Health or Behavioral Health Crisis or need someone to talk to, please call 1-800-273-TALK (toll free, 24 hour hotline)  Patient verbalizes understanding  of instructions and care plan provided today and agrees to view in MyChart. Active MyChart status and patient understanding of how to access instructions and care plan via MyChart confirmed with patient.     Delsa Sale RN BSN CCM [place]  [facility], [facility] Nurse Care Coordinator  Direct Dial: [phone] Website: [email or web address]

## 2023-11-21 DIAGNOSIS — N2581 Secondary hyperparathyroidism of renal origin: Secondary | ICD-10-CM | POA: Diagnosis not present

## 2023-11-21 DIAGNOSIS — D689 Coagulation defect, unspecified: Secondary | ICD-10-CM | POA: Diagnosis not present

## 2023-11-21 DIAGNOSIS — D509 Iron deficiency anemia, unspecified: Secondary | ICD-10-CM | POA: Diagnosis not present

## 2023-11-21 DIAGNOSIS — E875 Hyperkalemia: Secondary | ICD-10-CM | POA: Diagnosis not present

## 2023-11-21 DIAGNOSIS — Z992 Dependence on renal dialysis: Secondary | ICD-10-CM | POA: Diagnosis not present

## 2023-11-21 DIAGNOSIS — N186 End stage renal disease: Secondary | ICD-10-CM | POA: Diagnosis not present

## 2023-11-23 DIAGNOSIS — D689 Coagulation defect, unspecified: Secondary | ICD-10-CM | POA: Diagnosis not present

## 2023-11-23 DIAGNOSIS — E875 Hyperkalemia: Secondary | ICD-10-CM | POA: Diagnosis not present

## 2023-11-23 DIAGNOSIS — N2581 Secondary hyperparathyroidism of renal origin: Secondary | ICD-10-CM | POA: Diagnosis not present

## 2023-11-23 DIAGNOSIS — N186 End stage renal disease: Secondary | ICD-10-CM | POA: Diagnosis not present

## 2023-11-23 DIAGNOSIS — Z992 Dependence on renal dialysis: Secondary | ICD-10-CM | POA: Diagnosis not present

## 2023-11-23 DIAGNOSIS — D509 Iron deficiency anemia, unspecified: Secondary | ICD-10-CM | POA: Diagnosis not present

## 2023-11-24 NOTE — Progress Notes (Addendum)
Madelaine Bhat, CMA,acting as a Neurosurgeon for Arnette Felts, FNP.,have documented all relevant documentation on the behalf of Arnette Felts, FNP,as directed by  Arnette Felts, FNP while in the presence of Arnette Felts, FNP.  Subjective:  Patient ID: Whitney Walker , female    DOB: 11/14/1956 , 67 y.o.   MRN: [id number]  No chief complaint on file.   HPI  Patient presents today for a referral to dermatology , Patient reports compliance with medication. Patient denies any chest pain, SOB, or headaches. Patient reports she gets little scabs in her scalp that seems like it spreads. She is using medicated shampoo.  Patient reports she also has a spot under her right breast that has been present for a while. The area is darker, has been putting a cream (hydrocortisone). She has had medicated powder that has not been effective. This area has been present for years. Seems to be spreading.      Past Medical History:  Diagnosis Date   Anemia    Anxiety    CHF (congestive heart failure) (HCC)    Colon cancer (HCC)    treatment surgery   Complication of anesthesia    after first C- [place] "couldnt walk after", patient denies having a spinal   COPD (chronic obstructive pulmonary disease) (HCC)    Coronary artery disease    Depression    DVT (deep venous thrombosis) (HCC)    Encounter for annual health examination 08/05/2023   ESRD on hemodialysis (HCC)    Hemo: MWF   History of blood transfusion 04/2018   Hypertension    07/07/18- no longer takes BP medications   Leucocytosis 04/28/2012   Meningitis    Pain in limb 07/30/2013   PE (pulmonary embolism)    Peripheral vascular disease (HCC)    Primary hypertension 11/15/2013   Restless legs    Shortness of breath    with exertion   Sleep apnea    SOB (shortness of breath) 03/03/2019   Vertigo      Family History  Problem Relation Age of Onset   Cancer Mother 76       breast and bone   Cancer Father 77       prostate   Hypertension  Sister    Bleeding Disorder Sister    Cancer Cousin 20       breast cancer    Hypertension Daughter    Breast cancer Neg Hx      Current Outpatient Medications:    acetaminophen (TYLENOL) 650 MG CR tablet, Take 1 tablet (650 mg total) by mouth 3 (three) times daily as needed (arthritis). (Patient taking differently: Take 1,300 mg by mouth as needed for pain (arthritis).), Disp: 90 tablet, Rfl: 1   albuterol (VENTOLIN HFA) 108 (90 Base) MCG/ACT inhaler, Inhale 2 puffs into the lungs every 6 (six) hours as needed for wheezing or shortness of breath., Disp: 18 g, Rfl: 2   apixaban (ELIQUIS) 5 MG TABS tablet, Take 1 tablet (5 mg total) by mouth 2 (two) times daily., Disp: 60 tablet, Rfl: 2   meclizine (ANTIVERT) 12.5 MG tablet, Take 1 tablet (12.5 mg total) by mouth 3 (three) times daily as needed for dizziness. (Patient taking differently: Take 12.5 mg by mouth as needed for dizziness.), Disp: 90 tablet, Rfl: 1   multivitamin (RENA-VIT) TABS tablet, Take 1 tablet by mouth at bedtime., Disp: , Rfl:    omeprazole (PRILOSEC) 20 MG capsule, Take 1 capsule (20 mg total) by mouth daily.,  Disp: 90 capsule, Rfl: 1   sodium zirconium cyclosilicate (LOKELMA) 10 g PACK packet, Take 10 g by mouth daily., Disp: , Rfl:    Tiotropium Bromide-Olodaterol (STIOLTO RESPIMAT) 2.5-2.5 MCG/ACT AERS, Inhale 2 puffs into the lungs daily., Disp: 4 g, Rfl: 11   vitamin E 200 UNIT capsule, Take 200 Units by mouth daily., Disp: , Rfl:    acetaminophen (TYLENOL) 325 MG tablet, Take 650 mg by mouth as needed for moderate pain (pain score 4-6)., Disp: , Rfl:    midodrine (PROAMATINE) 10 MG tablet, Take 0.5 tablets (5 mg total) by mouth 2 (two) times daily with a meal., Disp: 60 tablet, Rfl: 0   sevelamer carbonate (RENVELA) 800 MG tablet, Take 2-4 tablets by mouth See admin instructions. Take 4 tablets with breakfast, lunch, and dinner. Take 2 tablets with a snack, Disp: , Rfl:    Allergies  Allergen Reactions    Difelikefalin Itching   Carnosine Other (See Comments)     Unknown   Gadolinium Derivatives Hives and Other (See Comments)    HIVES, Desc: HIVES W/ "DYE" USED FOR 1ST CT SCAN BUT NOT 2ND, NO PREMEDS USED, PT UNCERTAIN OF CIRCUMSTANCES,,?POSSIBLE MRI CONTRAST ALLERGY, ALL STUDIES DONE "SOMEWHERE" IN [place]//A.C., Onset Date: [id number]   Iohexol Hives and Other (See Comments)    Desc: HIVES W/ "DYE" USED FOR 1ST CT SCAN BUT NOT 2ND, NO PREMEDS USED, PT UNCERTAIN OF CIRCUMSTANCES,,?POSSIBLE MRI CONTRAST ALLERGY, ALL STUDIES DONE "SOMEWHERE" IN [place]//A.C., Onset Date: 63016010    Iodinated Contrast Media Hives    1974   Iodine Hives    1974   Naltrexone Other (See Comments)    Unknown 1974      Review of Systems  Constitutional: Negative.   Respiratory: Negative.    Cardiovascular: Negative.  Negative for palpitations.  Gastrointestinal: Negative.   Skin:  Positive for rash (underneath right breast and has scabs to scalp).  Neurological: Negative.  Negative for headaches.     Today's Vitals   11/25/23 1034  BP: 130/70  Pulse: 64  Temp: 98.5 F (36.9 C)  TempSrc: Oral  Weight: 198 lb 12.8 oz (90.2 kg)  Height: 5\' 1"  (1.549 m)  PainSc: 0-No pain   Body mass index is 37.56 kg/m.  Wt Readings from Last 3 Encounters:  12/01/23 198 lb 13.7 oz (90.2 kg)  11/25/23 198 lb 12.8 oz (90.2 kg)  09/09/23 200 lb 9.6 oz (91 kg)    Objective:  Physical Exam Vitals reviewed.  Constitutional:      General: She is not in acute distress.    Appearance: Normal appearance. She is obese.  Cardiovascular:     Rate and Rhythm: Normal rate and regular rhythm.     Pulses: Normal pulses.     Heart sounds: Normal heart sounds. No murmur heard. Pulmonary:     Effort: Pulmonary effort is normal. No respiratory distress.     Breath sounds: Normal breath sounds. No wheezing.  Skin:    General: Skin is warm and dry.     Capillary Refill: Capillary refill takes less than 2 seconds.      Findings: Rash (right breast has slightly hyperpigmented skin to medial area. Scalp with slightly raised rash I do not see any scabbing) present. No bruising.     Comments: Vas cath present  Neurological:     General: No focal deficit present.     Mental Status: She is alert and oriented to person, place, and time.     Cranial  Nerves: No cranial nerve deficit.     Motor: No weakness.  Psychiatric:        Mood and Affect: Mood normal.        Behavior: Behavior normal.        Thought Content: Thought content normal.        Judgment: Judgment normal.     Assessment And Plan:  Malignant hypertension with heart failure and end-stage renal dis [facility]) Assessment & Plan: Blood pressure is fairly controlled.  Continue current medications and follow-up with cardiology.   ESRD (end stage renal disease) on dialysis [facility]) Assessment & Plan: Continue dialysis Tues, Thus and Saturday, will obtain labs from [facility]   Class 2 obesity with body mass index (BMI) of 37.0 to 37.9 in adult, unspecified obesity type, unspecified whether serious comorbidity present Assessment & Plan: She is encouraged to strive for BMI less than 30 to decrease cardiac risk. Advised to aim for at least 150 minutes of exercise per week.    Atherosclerosis of aorta (HCC) Assessment & Plan: Continue statin, tolerating well   Scalp irritation -     Ambulatory referral to Dermatology  Rash Assessment & Plan: Has hyperpigmented skin underneath right breast medial side.  Small raised areas to her scalp, I do not see any scabbing, will refer to Dermatology at patient request  Orders: -     Ambulatory referral to Dermatology   Return for schedule BP f/u in March.  Patient was given opportunity to ask questions. Patient verbalized understanding of the plan and was able to repeat key elements of the plan. All questions were answered to their satisfaction.    Jeanell Sparrow, FNP, have reviewed all documentation  for this visit. The documentation on 12/09/23 for the exam, diagnosis, procedures, and orders are all accurate and complete.   IF YOU HAVE BEEN REFERRED TO A SPECIALIST, IT MAY TAKE 1-2 WEEKS TO SCHEDULE/PROCESS THE REFERRAL. IF YOU HAVE NOT HEARD FROM US/SPECIALIST IN TWO WEEKS, PLEASE GIVE [place] A CALL AT [phone].

## 2023-11-25 ENCOUNTER — Encounter: Payer: Self-pay | Admitting: Nurse Practitioner

## 2023-11-25 ENCOUNTER — Ambulatory Visit: Payer: 59 | Admitting: Nurse Practitioner

## 2023-11-25 VITALS — BP 130/70 | HR 64 | Temp 98.5°F | Ht 61.0 in | Wt 198.8 lb

## 2023-11-25 DIAGNOSIS — I132 Hypertensive heart and chronic kidney disease with heart failure and with stage 5 chronic kidney disease, or end stage renal disease: Secondary | ICD-10-CM

## 2023-11-25 DIAGNOSIS — N2581 Secondary hyperparathyroidism of renal origin: Secondary | ICD-10-CM | POA: Diagnosis not present

## 2023-11-25 DIAGNOSIS — I700 Atherosclerosis of aorta: Secondary | ICD-10-CM

## 2023-11-25 DIAGNOSIS — D509 Iron deficiency anemia, unspecified: Secondary | ICD-10-CM | POA: Diagnosis not present

## 2023-11-25 DIAGNOSIS — Z992 Dependence on renal dialysis: Secondary | ICD-10-CM | POA: Diagnosis not present

## 2023-11-25 DIAGNOSIS — D689 Coagulation defect, unspecified: Secondary | ICD-10-CM | POA: Diagnosis not present

## 2023-11-25 DIAGNOSIS — N186 End stage renal disease: Secondary | ICD-10-CM | POA: Diagnosis not present

## 2023-11-25 DIAGNOSIS — R21 Rash and other nonspecific skin eruption: Secondary | ICD-10-CM | POA: Diagnosis not present

## 2023-11-25 DIAGNOSIS — E875 Hyperkalemia: Secondary | ICD-10-CM | POA: Diagnosis not present

## 2023-11-25 DIAGNOSIS — Z6837 Body mass index (BMI) 37.0-37.9, adult: Secondary | ICD-10-CM

## 2023-11-25 DIAGNOSIS — E66812 Obesity, class 2: Secondary | ICD-10-CM

## 2023-11-25 DIAGNOSIS — R238 Other skin changes: Secondary | ICD-10-CM | POA: Insufficient documentation

## 2023-11-25 HISTORY — DX: Rash and other nonspecific skin eruption: R21

## 2023-11-25 NOTE — Assessment & Plan Note (Addendum)
Has hyperpigmented skin underneath right breast medial side.  Small raised areas to her scalp, I do not see any scabbing, will refer to Dermatology at patient request

## 2023-11-25 NOTE — Assessment & Plan Note (Signed)
She is encouraged to strive for BMI less than 30 to decrease cardiac risk. Advised to aim for at least 150 minutes of exercise per week.

## 2023-11-25 NOTE — Assessment & Plan Note (Signed)
Blood pressure is fairly controlled.  Continue current medications and follow-up with cardiology.

## 2023-11-25 NOTE — Assessment & Plan Note (Signed)
Continue statin, tolerating well

## 2023-11-25 NOTE — Assessment & Plan Note (Signed)
Continue dialysis Tues, Thus and Saturday, will obtain labs from [facility]

## 2023-11-25 NOTE — Assessment & Plan Note (Deleted)
Small raised areas, I do not see any scabbing, will refer to Dermatology at patient request

## 2023-11-28 DIAGNOSIS — N2581 Secondary hyperparathyroidism of renal origin: Secondary | ICD-10-CM | POA: Diagnosis not present

## 2023-11-28 DIAGNOSIS — E875 Hyperkalemia: Secondary | ICD-10-CM | POA: Diagnosis not present

## 2023-11-28 DIAGNOSIS — D689 Coagulation defect, unspecified: Secondary | ICD-10-CM | POA: Diagnosis not present

## 2023-11-28 DIAGNOSIS — Z992 Dependence on renal dialysis: Secondary | ICD-10-CM | POA: Diagnosis not present

## 2023-11-28 DIAGNOSIS — D509 Iron deficiency anemia, unspecified: Secondary | ICD-10-CM | POA: Diagnosis not present

## 2023-11-28 DIAGNOSIS — N186 End stage renal disease: Secondary | ICD-10-CM | POA: Diagnosis not present

## 2023-11-29 DIAGNOSIS — Z992 Dependence on renal dialysis: Secondary | ICD-10-CM | POA: Diagnosis not present

## 2023-11-29 DIAGNOSIS — I129 Hypertensive chronic kidney disease with stage 1 through stage 4 chronic kidney disease, or unspecified chronic kidney disease: Secondary | ICD-10-CM | POA: Diagnosis not present

## 2023-11-29 DIAGNOSIS — N186 End stage renal disease: Secondary | ICD-10-CM | POA: Diagnosis not present

## 2023-12-01 ENCOUNTER — Encounter (HOSPITAL_COMMUNITY): Payer: Self-pay

## 2023-12-01 ENCOUNTER — Inpatient Hospital Stay (HOSPITAL_COMMUNITY)
Admission: EM | Admit: 2023-12-01 | Discharge: 2023-12-02 | DRG: 640 | Disposition: A | Discharge status: Home / Self Care | Payer: 59 | Attending: Internal Medicine | Admitting: Internal Medicine

## 2023-12-01 ENCOUNTER — Inpatient Hospital Stay (HOSPITAL_COMMUNITY): Payer: 59

## 2023-12-01 ENCOUNTER — Other Ambulatory Visit: Payer: Self-pay

## 2023-12-01 DIAGNOSIS — E66812 Obesity, class 2: Secondary | ICD-10-CM | POA: Diagnosis not present

## 2023-12-01 DIAGNOSIS — G4733 Obstructive sleep apnea (adult) (pediatric): Secondary | ICD-10-CM | POA: Diagnosis not present

## 2023-12-01 DIAGNOSIS — I120 Hypertensive chronic kidney disease with stage 5 chronic kidney disease or end stage renal disease: Secondary | ICD-10-CM | POA: Diagnosis not present

## 2023-12-01 DIAGNOSIS — Z992 Dependence on renal dialysis: Secondary | ICD-10-CM | POA: Diagnosis not present

## 2023-12-01 DIAGNOSIS — E785 Hyperlipidemia, unspecified: Secondary | ICD-10-CM | POA: Diagnosis not present

## 2023-12-01 DIAGNOSIS — N2581 Secondary hyperparathyroidism of renal origin: Secondary | ICD-10-CM | POA: Diagnosis present

## 2023-12-01 DIAGNOSIS — I132 Hypertensive heart and chronic kidney disease with heart failure and with stage 5 chronic kidney disease, or end stage renal disease: Secondary | ICD-10-CM | POA: Diagnosis not present

## 2023-12-01 DIAGNOSIS — N183 Chronic kidney disease, stage 3 unspecified: Secondary | ICD-10-CM | POA: Diagnosis not present

## 2023-12-01 DIAGNOSIS — Z85038 Personal history of other malignant neoplasm of large intestine: Secondary | ICD-10-CM

## 2023-12-01 DIAGNOSIS — R110 Nausea: Secondary | ICD-10-CM | POA: Diagnosis present

## 2023-12-01 DIAGNOSIS — F32A Depression, unspecified: Secondary | ICD-10-CM | POA: Diagnosis present

## 2023-12-01 DIAGNOSIS — I953 Hypotension of hemodialysis: Secondary | ICD-10-CM | POA: Diagnosis not present

## 2023-12-01 DIAGNOSIS — R1013 Epigastric pain: Secondary | ICD-10-CM | POA: Diagnosis not present

## 2023-12-01 DIAGNOSIS — R197 Diarrhea, unspecified: Secondary | ICD-10-CM | POA: Diagnosis not present

## 2023-12-01 DIAGNOSIS — F17210 Nicotine dependence, cigarettes, uncomplicated: Secondary | ICD-10-CM | POA: Diagnosis present

## 2023-12-01 DIAGNOSIS — I444 Left anterior fascicular block: Secondary | ICD-10-CM | POA: Diagnosis not present

## 2023-12-01 DIAGNOSIS — I5032 Chronic diastolic (congestive) heart failure: Secondary | ICD-10-CM | POA: Diagnosis present

## 2023-12-01 DIAGNOSIS — I700 Atherosclerosis of aorta: Secondary | ICD-10-CM | POA: Diagnosis present

## 2023-12-01 DIAGNOSIS — Z86718 Personal history of other venous thrombosis and embolism: Secondary | ICD-10-CM

## 2023-12-01 DIAGNOSIS — D631 Anemia in chronic kidney disease: Secondary | ICD-10-CM | POA: Diagnosis not present

## 2023-12-01 DIAGNOSIS — E875 Hyperkalemia: Principal | ICD-10-CM | POA: Diagnosis present

## 2023-12-01 DIAGNOSIS — Z8249 Family history of ischemic heart disease and other diseases of the circulatory system: Secondary | ICD-10-CM

## 2023-12-01 DIAGNOSIS — D72829 Elevated white blood cell count, unspecified: Secondary | ICD-10-CM | POA: Diagnosis present

## 2023-12-01 DIAGNOSIS — I739 Peripheral vascular disease, unspecified: Secondary | ICD-10-CM | POA: Diagnosis not present

## 2023-12-01 DIAGNOSIS — Z86711 Personal history of pulmonary embolism: Secondary | ICD-10-CM

## 2023-12-01 DIAGNOSIS — I2510 Atherosclerotic heart disease of native coronary artery without angina pectoris: Secondary | ICD-10-CM | POA: Diagnosis not present

## 2023-12-01 DIAGNOSIS — R0602 Shortness of breath: Secondary | ICD-10-CM | POA: Diagnosis not present

## 2023-12-01 DIAGNOSIS — E6609 Other obesity due to excess calories: Secondary | ICD-10-CM

## 2023-12-01 DIAGNOSIS — E871 Hypo-osmolality and hyponatremia: Secondary | ICD-10-CM | POA: Diagnosis present

## 2023-12-01 DIAGNOSIS — F419 Anxiety disorder, unspecified: Secondary | ICD-10-CM | POA: Diagnosis present

## 2023-12-01 DIAGNOSIS — Z9049 Acquired absence of other specified parts of digestive tract: Secondary | ICD-10-CM

## 2023-12-01 DIAGNOSIS — Z6836 Body mass index (BMI) 36.0-36.9, adult: Secondary | ICD-10-CM | POA: Diagnosis not present

## 2023-12-01 DIAGNOSIS — J449 Chronic obstructive pulmonary disease, unspecified: Secondary | ICD-10-CM | POA: Diagnosis present

## 2023-12-01 DIAGNOSIS — Z91041 Radiographic dye allergy status: Secondary | ICD-10-CM | POA: Diagnosis not present

## 2023-12-01 DIAGNOSIS — Z803 Family history of malignant neoplasm of breast: Secondary | ICD-10-CM

## 2023-12-01 DIAGNOSIS — Z79899 Other long term (current) drug therapy: Secondary | ICD-10-CM

## 2023-12-01 DIAGNOSIS — G2581 Restless legs syndrome: Secondary | ICD-10-CM | POA: Diagnosis not present

## 2023-12-01 DIAGNOSIS — Z7901 Long term (current) use of anticoagulants: Secondary | ICD-10-CM

## 2023-12-01 DIAGNOSIS — Z91158 Patient's noncompliance with renal dialysis for other reason: Secondary | ICD-10-CM | POA: Diagnosis not present

## 2023-12-01 DIAGNOSIS — N281 Cyst of kidney, acquired: Secondary | ICD-10-CM | POA: Diagnosis not present

## 2023-12-01 DIAGNOSIS — N186 End stage renal disease: Secondary | ICD-10-CM | POA: Diagnosis not present

## 2023-12-01 DIAGNOSIS — Z832 Family history of diseases of the blood and blood-forming organs and certain disorders involving the immune mechanism: Secondary | ICD-10-CM

## 2023-12-01 DIAGNOSIS — R109 Unspecified abdominal pain: Secondary | ICD-10-CM | POA: Diagnosis present

## 2023-12-01 DIAGNOSIS — Z888 Allergy status to other drugs, medicaments and biological substances status: Secondary | ICD-10-CM

## 2023-12-01 DIAGNOSIS — Z8661 Personal history of infections of the central nervous system: Secondary | ICD-10-CM

## 2023-12-01 DIAGNOSIS — Z90710 Acquired absence of both cervix and uterus: Secondary | ICD-10-CM

## 2023-12-01 DIAGNOSIS — K439 Ventral hernia without obstruction or gangrene: Secondary | ICD-10-CM | POA: Diagnosis not present

## 2023-12-01 LAB — CBC WITH DIFFERENTIAL/PLATELET
Abs Immature Granulocytes: 0.06 10*3/uL (ref 0.00–0.07)
Basophils Absolute: 0 10*3/uL (ref 0.0–0.1)
Basophils Relative: 0 %
Eosinophils Absolute: 0.3 10*3/uL (ref 0.0–0.5)
Eosinophils Relative: 3 %
HCT: 44.3 % (ref 36.0–46.0)
Hemoglobin: 14.1 g/dL (ref 12.0–15.0)
Immature Granulocytes: 1 %
Lymphocytes Relative: 19 %
Lymphs Abs: 2.3 10*3/uL (ref 0.7–4.0)
MCH: 29.4 pg (ref 26.0–34.0)
MCHC: 31.8 g/dL (ref 30.0–36.0)
MCV: 92.5 fL (ref 80.0–100.0)
Monocytes Absolute: 0.6 10*3/uL (ref 0.1–1.0)
Monocytes Relative: 5 %
Neutro Abs: 8.6 10*3/uL — ABNORMAL HIGH (ref 1.7–7.7)
Neutrophils Relative %: 72 %
Platelets: 221 10*3/uL (ref 150–400)
RBC: 4.79 MIL/uL (ref 3.87–5.11)
RDW: 14 % (ref 11.5–15.5)
WBC: 11.9 10*3/uL — ABNORMAL HIGH (ref 4.0–10.5)
nRBC: 0 % (ref 0.0–0.2)

## 2023-12-01 LAB — RESP PANEL BY RT-PCR (RSV, FLU A&B, COVID)  RVPGX2
Influenza A by PCR: NEGATIVE
Influenza B by PCR: NEGATIVE
Resp Syncytial Virus by PCR: NEGATIVE
SARS Coronavirus 2 by RT PCR: NEGATIVE

## 2023-12-01 LAB — COMPREHENSIVE METABOLIC PANEL
ALT: 35 U/L (ref 0–44)
AST: 30 U/L (ref 15–41)
Albumin: 3.8 g/dL (ref 3.5–5.0)
Alkaline Phosphatase: 96 U/L (ref 38–126)
Anion gap: 16 — ABNORMAL HIGH (ref 5–15)
BUN: 62 mg/dL — ABNORMAL HIGH (ref 8–23)
CO2: 16 mmol/L — ABNORMAL LOW (ref 22–32)
Calcium: 9.3 mg/dL (ref 8.9–10.3)
Chloride: 98 mmol/L (ref 98–111)
Creatinine, Ser: 13.38 mg/dL — ABNORMAL HIGH (ref 0.44–1.00)
GFR, Estimated: 3 mL/min — ABNORMAL LOW (ref >60)
Glucose, Bld: 86 mg/dL (ref 70–99)
Potassium: 7.4 mmol/L (ref 3.5–5.1)
Sodium: 130 mmol/L — ABNORMAL LOW (ref 135–145)
Total Bilirubin: 0.8 mg/dL (ref <1.2)
Total Protein: 8.6 g/dL — ABNORMAL HIGH (ref 6.5–8.1)

## 2023-12-01 LAB — CBG MONITORING, ED
Glucose-Capillary: 79 mg/dL (ref 70–99)
Glucose-Capillary: 126 mg/dL — ABNORMAL HIGH (ref 70–99)

## 2023-12-01 LAB — LIPASE, BLOOD
Lipase: 85 U/L — ABNORMAL HIGH (ref 11–51)
Lipase: 75 U/L — ABNORMAL HIGH (ref 11–51)

## 2023-12-01 LAB — HEPATITIS B SURFACE ANTIGEN: Hepatitis B Surface Ag: NONREACTIVE

## 2023-12-01 MED ORDER — HYDRALAZINE HCL 20 MG/ML IJ SOLN: 5.0000 mg | INTRAMUSCULAR | Status: DC | PRN

## 2023-12-01 MED ORDER — SODIUM ZIRCONIUM CYCLOSILICATE 5 G PO PACK: 5.0000 g | PACK | Freq: Once | ORAL | Status: DC

## 2023-12-01 MED ORDER — ACETAMINOPHEN 325 MG PO TABS: 650.0000 mg | ORAL_TABLET | ORAL | Status: DC | PRN

## 2023-12-01 MED ORDER — ACETAMINOPHEN 325 MG PO TABS
650.0000 mg | ORAL_TABLET | Freq: Four times a day (QID) | ORAL | Status: DC | PRN
  Filled 2023-12-01: qty 2

## 2023-12-01 MED ORDER — DEXTROSE 50 % IV SOLN
1.0000 | Freq: Once | INTRAVENOUS | Status: AC
  Administered 2023-12-01: 50 mL via INTRAVENOUS
  Filled 2023-12-01: qty 50

## 2023-12-01 MED ORDER — ALBUTEROL SULFATE (2.5 MG/3ML) 0.083% IN NEBU
10.0000 mg | INHALATION_SOLUTION | Freq: Once | RESPIRATORY_TRACT | Status: AC
  Administered 2023-12-01: 10 mg via RESPIRATORY_TRACT
  Filled 2023-12-01: qty 12

## 2023-12-01 MED ORDER — ACETAMINOPHEN 650 MG RE SUPP: 650.0000 mg | Freq: Four times a day (QID) | RECTAL | Status: DC | PRN

## 2023-12-01 MED ORDER — APIXABAN 5 MG PO TABS
5.0000 mg | ORAL_TABLET | Freq: Two times a day (BID) | ORAL | Status: DC
  Administered 2023-12-02: 5 mg via ORAL
  Filled 2023-12-01: qty 1

## 2023-12-01 MED ORDER — ARFORMOTEROL TARTRATE 15 MCG/2ML IN NEBU
15.0000 ug | INHALATION_SOLUTION | Freq: Two times a day (BID) | RESPIRATORY_TRACT | Status: DC
  Administered 2023-12-02: 15 ug via RESPIRATORY_TRACT
  Filled 2023-12-01: qty 2

## 2023-12-01 MED ORDER — MECLIZINE HCL 25 MG PO TABS: 12.5000 mg | ORAL_TABLET | Freq: Three times a day (TID) | ORAL | Status: DC | PRN

## 2023-12-01 MED ORDER — HEPARIN SODIUM (PORCINE) 1000 UNIT/ML IJ SOLN
INTRAMUSCULAR | Status: AC
  Filled 2023-12-01: qty 4

## 2023-12-01 MED ORDER — HEPARIN SODIUM (PORCINE) 1000 UNIT/ML IJ SOLN
4000.0000 [IU] | Freq: Once | INTRAMUSCULAR | Status: DC
  Filled 2023-12-01: qty 4

## 2023-12-01 MED ORDER — ALBUTEROL SULFATE (2.5 MG/3ML) 0.083% IN NEBU: 2.5000 mg | INHALATION_SOLUTION | RESPIRATORY_TRACT | Status: DC | PRN

## 2023-12-01 MED ORDER — UMECLIDINIUM BROMIDE 62.5 MCG/ACT IN AEPB
1.0000 | INHALATION_SPRAY | Freq: Every day | RESPIRATORY_TRACT | Status: DC
  Filled 2023-12-01: qty 7

## 2023-12-01 MED ORDER — INSULIN ASPART 100 UNIT/ML IV SOLN
5.0000 [IU] | Freq: Once | INTRAVENOUS | Status: AC
  Administered 2023-12-01: 5 [IU] via INTRAVENOUS

## 2023-12-01 MED ORDER — ONDANSETRON HCL 4 MG/2ML IJ SOLN
4.0000 mg | Freq: Four times a day (QID) | INTRAMUSCULAR | Status: DC | PRN
  Administered 2023-12-01: 4 mg via INTRAVENOUS
  Filled 2023-12-01: qty 2

## 2023-12-01 MED ORDER — CALCIUM GLUCONATE 10 % IV SOLN
1.0000 g | Freq: Once | INTRAVENOUS | Status: AC
  Administered 2023-12-01: 1 g via INTRAVENOUS
  Filled 2023-12-01: qty 10

## 2023-12-01 MED ORDER — PANTOPRAZOLE SODIUM 40 MG PO TBEC
40.0000 mg | DELAYED_RELEASE_TABLET | Freq: Every day | ORAL | Status: DC
  Administered 2023-12-02: 40 mg via ORAL
  Filled 2023-12-01: qty 1

## 2023-12-01 MED ORDER — SODIUM ZIRCONIUM CYCLOSILICATE 10 G PO PACK
10.0000 g | PACK | Freq: Once | ORAL | Status: AC
  Administered 2023-12-01: 10 g via ORAL
  Filled 2023-12-01: qty 1

## 2023-12-01 MED ORDER — HYDROMORPHONE HCL 1 MG/ML IJ SOLN
0.5000 mg | INTRAMUSCULAR | Status: DC | PRN
  Administered 2023-12-01: 0.5 mg via INTRAVENOUS
  Filled 2023-12-01: qty 0.5

## 2023-12-01 MED ORDER — CHLORHEXIDINE GLUCONATE CLOTH 2 % EX PADS: 6.0000 | MEDICATED_PAD | Freq: Every day | CUTANEOUS | Status: DC

## 2023-12-01 NOTE — ED Notes (Signed)
Called CCMD to set pt up on tele.

## 2023-12-01 NOTE — ED Notes (Signed)
Pt states no longer makes urine

## 2023-12-01 NOTE — ED Triage Notes (Addendum)
Pt c/o diarrhea, nausea, chillsx3d. Pt dialysis M, W, Fri. Pt missed dialysis today due to diarrhea. PT last went to dialysis on Fri.

## 2023-12-01 NOTE — ED Notes (Signed)
Date and time results received: 12/01/23 1445 (use smartphrase ".now" to insert current time)  Test: Potassium Critical Value: 7.4  Name of Provider Notified: Sharyl Nimrod, [place]  Orders Received? Or Actions Taken?:  notified

## 2023-12-01 NOTE — ED Notes (Signed)
Phlebotomist unable to get blood from pt

## 2023-12-01 NOTE — ED Notes (Signed)
Patient transported to dialysis

## 2023-12-01 NOTE — Consult Note (Signed)
[facility]ation [facility]    Indication for Consultation:  Management of ESRD/hemodialysis; anemia, hypertension/volume and secondary hyperparathyroidism  [id number], [facility], [facility]  HPI: [facility] is a 67 y.o. female with ESRD on HD MWF at [facility].  Past medical history significant for HTN, chronic diastolic HF, COPD, CAD, PAD, Hx colon cancer in remission, OA, anxiety/depression, Hx DVT on AC and history of chronic hyperkalemia on dialy lokelma.  [facility] to [facility] due to abdominal pain, nausea and diarrhea x 3 days.  Reports poor PO intake only eating rice yesterday.  Denies intake of K rich foods/drinks.  Admits she did not take lokelma yesterday or today.  Reports palpitations and intermittent cramping in b/l hands yesterday and today.  Denies vomiting, CP, SOB, dizziness and fatigue.  Admits to weakness.  Denies sick contacts.  Completed dialysis on Friday leaving 1kg under her dry weight. States her catheter has been working properly with no issues.   Pertinent findings during work up include K 7.4, EKG with peaked T waves and negative respiratory panel.  Patient being admitted for further evaluation and management.   Past Medical History:  Diagnosis Date   Anemia    Anxiety    CHF (congestive heart failure) (HCC)    Colon cancer (HCC)    treatment surgery   Complication of anesthesia    after first C- [place] "couldnt walk after", patient denies having a spinal   COPD (chronic obstructive pulmonary disease) (HCC)    Coronary artery disease    Depression    DVT (deep venous thrombosis) (HCC)    Encounter for annual health examination 08/05/2023   ESRD on hemodialysis (HCC)    Hemo: MWF   History of blood transfusion 04/2018   Hypertension    07/07/18- no longer takes BP medications   Leucocytosis 04/28/2012   Meningitis    Pain in limb 07/30/2013   PE (pulmonary embolism)    Peripheral vascular disease (HCC)    Primary  hypertension 11/15/2013   Restless legs    Shortness of breath    with exertion   Sleep apnea    SOB (shortness of breath) 03/03/2019   Vertigo    Past Surgical History:  Procedure Laterality Date   ABDOMINAL AORTAGRAM N/A 07/26/2013   Procedure: ABDOMINAL Ronny Flurry;  Surgeon: Fransisco Hertz, MD;  Location: [facility] CATH LAB;  Service: Cardiovascular;  Laterality: N/A;   ABDOMINAL HYSTERECTOMY     AV FISTULA PLACEMENT Left 05/05/2018   Procedure: ARTERIOVENOUS (AV) FISTULA CREATION BRACHIOCEPHALIC;  Surgeon: Maeola Harman, MD;  Location: [facility] OR;  Service: Vascular;  Laterality: Left;   AV FISTULA PLACEMENT Left 07/16/2018   Procedure: ARTERIOVENOUS FISTULA CREATION;  Surgeon: Maeola Harman, MD;  Location[facility] OR;  Service: Vascular;  Laterality: Left;   BASCILIC VEIN TRANSPOSITION Left 09/03/2018   Procedure: BASILIC VEIN TRANSPOSITION SECOND STAGE;  Surgeon: Maeola Harman, MD;  Location: [facility] OR;  Service: Vascular;  Laterality: Left;   BREAST BIOPSY Left    CESAREAN SECTION     X 3 1974-1977   CHOLECYSTECTOMY     CHOLECYSTECTOMY  1980/s   COLECTOMY  2010   DIVERTICULOSIS SURGERY-2002  2012   FEMORAL-POPLITEAL BYPASS GRAFT Left 08/13/2013   Procedure: BYPASS GRAFT FEMORAL-POPLITEAL ARTERY WITH NON-REVERSED SAPHANEOUS VEIN; ULTRASOUND GUIDED;  Surgeon: Pryor Ochoa, MD;  Location: [facility];  Service: Vascular;  Laterality: Left;   FLEXIBLE SIGMOIDOSCOPY N/A 12/10/2022   Procedure: FLEXIBLE SIGMOIDOSCOPY;  Surgeon: Jeani Hawking,  MD;  Location: [facility] ENDOSCOPY;  Service: Gastroenterology;  Laterality: N/A;   INSERTION OF DIALYSIS CATHETER Right 04/30/2018   Procedure: INSERTION OF Right Internal Jugular DIALYSIS CATHETER;  Surgeon: Nada Libman, MD;  Location: [facility] OR;  Service: Vascular;  Laterality: Right;   IR FLUORO GUIDE CV LINE LEFT  05/01/2022   IR FLUORO GUIDE CV LINE LEFT  05/21/2022   IR FLUORO GUIDE CV LINE RIGHT  07/24/2020   IR FLUORO GUIDE CV LINE RIGHT   07/21/2021   IR FLUORO GUIDE CV LINE RIGHT  05/20/2022   IR PTA VENOUS EXCEPT DIALYSIS CIRCUIT  05/21/2022   IR REMOVAL TUN CV CATH W/O FL  07/21/2020   IR REMOVAL TUN CV CATH W/O FL  07/19/2021   IR REMOVAL TUN CV CATH W/O FL  05/17/2022   IR US GUIDE VASC ACCESS LEFT  05/21/2022   IR US GUIDE VASC ACCESS RIGHT  07/24/2020   IR US GUIDE VASC ACCESS RIGHT  07/21/2021   IR US GUIDE VASC ACCESS RIGHT  05/20/2022   IR VENOCAVAGRAM SVC  05/21/2022   LEFT HEART CATH AND CORONARY ANGIOGRAPHY N/A 05/01/2021   Procedure: LEFT HEART CATH AND CORONARY ANGIOGRAPHY;  Surgeon: Yates Decamp, MD;  Location: [facility] INVASIVE CV LAB;  Service: Cardiovascular;  Laterality: N/A;   LEFT HEART CATH AND CORONARY ANGIOGRAPHY N/A 05/02/2022   Procedure: LEFT HEART CATH AND CORONARY ANGIOGRAPHY;  Surgeon: Yates Decamp, MD;  Location: [facility] INVASIVE CV LAB;  Service: Cardiovascular;  Laterality: N/A;   LOWER EXTREMITY ANGIOGRAM Left 07/26/2013   Procedure: LOWER EXTREMITY ANGIOGRAM;  Surgeon: Fransisco Hertz, MD;  Location: [facility] CATH LAB;  Service: Cardiovascular;  Laterality: Left;   Family History  Problem Relation Age of Onset   Cancer Mother 84       breast and bone   Cancer Father 55       prostate   Hypertension Sister    Bleeding Disorder Sister    Cancer Cousin 20       breast cancer    Hypertension Daughter    Breast cancer Neg Hx    Social History:  reports that she has been smoking cigarettes. She started smoking about 51 years ago. She has a 25 pack-year smoking history. She has quit using smokeless tobacco. She reports that she does not currently use alcohol. She reports that she does not currently use drugs after having used the following drugs: Oxycodone. Allergies  Allergen Reactions   Difelikefalin Itching   Carnosine Other (See Comments)     Unknown   Gadolinium Derivatives Hives and Other (See Comments)    HIVES, Desc: HIVES W/ "DYE" USED FOR 1ST CT SCAN BUT NOT 2ND, NO PREMEDS USED, PT UNCERTAIN OF  CIRCUMSTANCES,,?POSSIBLE MRI CONTRAST ALLERGY, ALL STUDIES DONE "SOMEWHERE" IN [place]//A.C., Onset Date: 43329518   Iohexol Hives and Other (See Comments)    Desc: HIVES W/ "DYE" USED FOR 1ST CT SCAN BUT NOT 2ND, NO PREMEDS USED, PT UNCERTAIN OF CIRCUMSTANCES,,?POSSIBLE MRI CONTRAST ALLERGY, ALL STUDIES DONE "SOMEWHERE" IN [place]//A.C., Onset Date: 84166063    Iodinated Contrast Media Hives    1974   Iodine Hives    1974   Naltrexone Other (See Comments)    Unknown 1974    Prior to Admission medications   Medication Sig Start Date End Date Taking? Authorizing Provider  acetaminophen (TYLENOL) 325 MG tablet Take 650 mg by mouth as needed for moderate pain (pain score 4-6).   Yes [provider]  acetaminophen (TYLENOL)  650 MG CR tablet Take 1 tablet (650 mg total) by mouth 3 (three) times daily as needed (arthritis). Patient taking differently: Take 1,300 mg by mouth as needed for pain (arthritis). 09/18/23  Yes Arnette Felts, [facility]  albuterol (VENTOLIN HFA) 108 (90 Base) MCG/ACT inhaler Inhale 2 puffs into the lungs every 6 (six) hours as needed for wheezing or shortness of breath. 09/18/23  Yes Arnette Felts, [facility]  apixaban (ELIQUIS) 5 MG TABS tablet Take 1 tablet (5 mg total) by mouth 2 (two) times daily. 09/18/23  Yes Arnette Felts, [facility]  meclizine (ANTIVERT) 12.5 MG tablet Take 1 tablet (12.5 mg total) by mouth 3 (three) times daily as needed for dizziness. Patient taking differently: Take 12.5 mg by mouth as needed for dizziness. 09/18/23  Yes Arnette Felts, [facility]  multivitamin (RENA-VIT) TABS tablet Take 1 tablet by mouth at bedtime.   Yes [provider]  omeprazole (PRILOSEC) 20 MG capsule Take 1 capsule (20 mg total) by mouth daily. 09/18/23  Yes Arnette Felts, [facility]  sevelamer carbonate (RENVELA) 800 MG tablet Take 2-4 tablets by mouth See admin instructions. Take 4 tablets with breakfast, lunch, and dinner. Take 2 tablets with a snack 10/27/23  Yes [provider]  sodium zirconium cyclosilicate (LOKELMA) 10 g PACK packet Take 10 g by mouth daily.   Yes [provider]  Tiotropium Bromide-Olodaterol (STIOLTO RESPIMAT) 2.5-2.5 MCG/ACT AERS Inhale 2 puffs into the lungs daily. 09/19/23  Yes Hunsucker, Lesia Sago, MD  vitamin E 200 UNIT capsule Take 200 Units by mouth daily.   Yes [provider]   Current Facility-Administered Medications  Medication Dose Route Frequency Provider Last Rate Last Admin   acetaminophen (TYLENOL) tablet 650 mg  650 mg Oral Q6H PRN Elgergawy, Leana Roe, MD       Or   acetaminophen (TYLENOL) suppository 650 mg  650 mg Rectal Q6H PRN Elgergawy, Leana Roe, MD       acetaminophen (TYLENOL) tablet 650 mg  650 mg Oral PRN Elgergawy, Leana Roe, MD       albuterol (PROVENTIL) (2.5 MG/3ML) 0.083% nebulizer solution 2.5 mg  2.5 mg Nebulization Q2H PRN Elgergawy, Leana Roe, MD       apixaban (ELIQUIS) tablet 5 mg  5 mg Oral BID Elgergawy, Leana Roe, MD       arformoterol (BROVANA) nebulizer solution 15 mcg  15 mcg Nebulization BID Elgergawy, Leana Roe, MD       And   umeclidinium bromide (INCRUSE ELLIPTA) 62.5 MCG/ACT 1 puff  1 puff Inhalation Daily Elgergawy, Leana Roe, MD       [START ON 12/02/2023] Chlorhexidine Gluconate Cloth 2 % PADS 6 each  6 each Topical Q0600 Elgergawy, Leana Roe, MD       hydrALAZINE (APRESOLINE) injection 5 mg  5 mg Intravenous Q4H PRN Elgergawy, Leana Roe, MD       HYDROmorphone (DILAUDID) injection 0.5 mg  0.5 mg Intravenous Q4H PRN Elgergawy, Leana Roe, MD       meclizine (ANTIVERT) tablet 12.5 mg  12.5 mg Oral TID PRN Elgergawy, Leana Roe, MD       pantoprazole (PROTONIX) EC tablet 40 mg  40 mg Oral Daily Elgergawy, Leana Roe, MD       Current Outpatient Medications  Medication Sig Dispense Refill   acetaminophen (TYLENOL) 325 MG tablet Take 650 mg by mouth as needed for moderate pain (pain score 4-6).     acetaminophen (TYLENOL) 650 MG CR tablet Take 1 tablet (650 mg total)  by mouth 3  (three) times daily as needed (arthritis). (Patient taking differently: Take 1,300 mg by mouth as needed for pain (arthritis).) 90 tablet 1   albuterol (VENTOLIN HFA) 108 (90 Base) MCG/ACT inhaler Inhale 2 puffs into the lungs every 6 (six) hours as needed for wheezing or shortness of breath. 18 g 2   apixaban (ELIQUIS) 5 MG TABS tablet Take 1 tablet (5 mg total) by mouth 2 (two) times daily. 60 tablet 2   meclizine (ANTIVERT) 12.5 MG tablet Take 1 tablet (12.5 mg total) by mouth 3 (three) times daily as needed for dizziness. (Patient taking differently: Take 12.5 mg by mouth as needed for dizziness.) 90 tablet 1   multivitamin (RENA-VIT) TABS tablet Take 1 tablet by mouth at bedtime.     omeprazole (PRILOSEC) 20 MG capsule Take 1 capsule (20 mg total) by mouth daily. 90 capsule 1   sevelamer carbonate (RENVELA) 800 MG tablet Take 2-4 tablets by mouth See admin instructions. Take 4 tablets with breakfast, lunch, and dinner. Take 2 tablets with a snack     sodium zirconium cyclosilicate (LOKELMA) 10 g PACK packet Take 10 g by mouth daily.     Tiotropium Bromide-Olodaterol (STIOLTO RESPIMAT) 2.5-2.5 MCG/ACT AERS Inhale 2 puffs into the lungs daily. 4 g 11   vitamin E 200 UNIT capsule Take 200 Units by mouth daily.     Labs: Basic Metabolic Panel: Recent Labs  Lab 12/01/23 1305  NA 130*  K 7.4*  CL 98  CO2 16*  GLUCOSE 86  BUN 62*  CREATININE 13.38*  CALCIUM 9.3   Liver Function Tests: Recent Labs  Lab 12/01/23 1305  AST 30  ALT 35  ALKPHOS 96  BILITOT 0.8  PROT 8.6*  ALBUMIN 3.8   Recent Labs  Lab 12/01/23 1305  LIPASE 85*   CBC: Recent Labs  Lab 12/01/23 1305  WBC 11.9*  NEUTROABS 8.6*  HGB 14.1  HCT 44.3  MCV 92.5  PLT 221   CBG: Recent Labs  Lab 12/01/23 1531  GLUCAP 79    Studies/Results: No results found.  ROS: All others negative except those listed in HPI.  Physical Exam: Vitals:   12/01/23 1400 12/01/23 1430 12/01/23 1545 12/01/23 1611  BP: (!)  123/51 (!) 124/50 134/62   Pulse: 72 87 95   Resp: 19 19 20    Temp:      SpO2: 97% 96% 95% 99%  Weight:      Height:         General: WDWN in NAD Head: NCAT sclera not icteric MMM Neck: Supple. No lymphadenopathy Lungs: CTA bilaterally. No wheeze, rales or rhonchi. Breathing is unlabored on RA Heart: RRR. No murmur, rubs or gallops.  Abdomen: soft, +epigastric tenderness, non distended Lower extremities:no edema, ischemic changes, or open wounds  Neuro: AAOx3. Moves all extremities spontaneously. Psych:  Responds to questions appropriately with a normal affect. Dialysis Access: [facility]  Dialysis Orders:  MWF - [facility]  3.5hrs, BFR 400, DFR  AF 1.5,  EDW 90.8kg, 2K/ 2.5Ca  Access: TDC  Heparin 6000 unit bolus Hectorol 5 mcg IV qHD  Assessment/Plan:  Hyperkalemia - K 7.4, orders written for emergent HD unfortunately no open bays at the moment, will be next to come up.  RN aware. Currently getting medical management in the ED.  On daily lokelma as outpatient.  Abdominal pain/nausea/diarrhea - etiology unclear. Plan for CT abd/pelvis  ESRD -  on MWF.  Emergent HD today as noted above.   Hypertension/volume  -  BP in goal. Does not appear volume overloaded.  UF as tolerated. Under dry weight last HD.   Anemia of CKD - Hgb 14.1. No indication for ESA/iron therapy.   Secondary Hyperparathyroidism -  Ca in goal. Check phos. Continue VDRA and binders.   Nutrition - Renal diet w/fluid restrictions when advanced.   Virgina Norfolk, PA-C [employer] 12/01/2023, 4:27 PM

## 2023-12-01 NOTE — H&P (Signed)
TRH H&P   Patient Demographics:    Karisha Chaudhari, is a 67 y.o. female  MRN: [id number]   DOB - 01/10/1956  Admit Date - 12/01/2023  Outpatient Primary MD for the patient is Arnette Felts, FNP  Referring MD/NP/PA: PA Meredith  Patient coming from: home  Chief Complaint  Patient presents with   Diarrhea      HPI:    Gene Niska  is a 67 y.o. female, with past medical history of hypertension, chronic diastolic CHF, COPD, CAD, ESRD, history of colon cancer in the past, in remission, multiple DVTs, on chronic anticoagulation, osteoarthritis with chronic left hip pain, depression. -Presents to ED secondary to complaints of abdominal pain, nausea, diarrhea, chills, patient reports symptoms has been going going for last 3 days, she does report poor appetite, almost no oral intake last 3 days, reports she had her dialysis on Friday, reports she finished 3 hours, but it was challenging as she was under her dry weight, reports given significant diarrhea, weakness and pain could not go for dialysis today, so she presented to ED, she denies any recent antibiotics use, no sick contacts, she lives by herself, no recent travel, denies vomiting, diarrhea, constipation, melena or hematochezia. -In ED her workup significant for sodium of 130, potassium significantly elevated at 7.4, BUN at 62, with a creatinine of 13.38, her EKG showing peaked T wave, [facility] hospitalist consulted to admit.      Review of systems:      A full 10 point Review of Systems was done, except as stated above, all other Review of Systems were negative.   With Past History of the following :    Past Medical History:  Diagnosis Date   Anemia    Anxiety    CHF (congestive heart failure) (HCC)    Colon cancer (HCC)    treatment surgery   Complication of anesthesia    after first C- [place] "couldnt walk after",  patient denies having a spinal   COPD (chronic obstructive pulmonary disease) (HCC)    Coronary artery disease    Depression    DVT (deep venous thrombosis) (HCC)    Encounter for annual health examination 08/05/2023   ESRD on hemodialysis (HCC)    Hemo: MWF   History of blood transfusion 04/2018   Hypertension    07/07/18- no longer takes BP medications   Leucocytosis 04/28/2012   Meningitis    Pain in limb 07/30/2013   PE (pulmonary embolism)    Peripheral vascular disease (HCC)    Primary hypertension 11/15/2013   Restless legs    Shortness of breath    with exertion   Sleep apnea    SOB (shortness of breath) 03/03/2019   Vertigo       Past Surgical History:  Procedure Laterality Date   ABDOMINAL AORTAGRAM N/A 07/26/2013   Procedure: ABDOMINAL Ronny Flurry;  Surgeon: Fransisco Hertz,  MD;  Location: [facility] CATH LAB;  Service: Cardiovascular;  Laterality: N/A;   ABDOMINAL HYSTERECTOMY     AV FISTULA PLACEMENT Left 05/05/2018   Procedure: ARTERIOVENOUS (AV) FISTULA CREATION BRACHIOCEPHALIC;  Surgeon: Maeola Harman, MD;  Location: [facility] OR;  Service: Vascular;  Laterality: Left;   AV FISTULA PLACEMENT Left 07/16/2018   Procedure: ARTERIOVENOUS FISTULA CREATION;  Surgeon: Maeola Harman, MD;  Location: [facility] OR;  Service: Vascular;  Laterality: Left;   BASCILIC VEIN TRANSPOSITION Left 09/03/2018   Procedure: BASILIC VEIN TRANSPOSITION SECOND STAGE;  Surgeon: Maeola Harman, MD;  Location: [facility] OR;  Service: Vascular;  Laterality: Left;   BREAST BIOPSY Left    CESAREAN SECTION     X 3 1974-1977   CHOLECYSTECTOMY     CHOLECYSTECTOMY  1980/s   COLECTOMY  2010   DIVERTICULOSIS SURGERY-2002  2012   FEMORAL-POPLITEAL BYPASS GRAFT Left 08/13/2013   Procedure: BYPASS GRAFT FEMORAL-POPLITEAL ARTERY WITH NON-REVERSED SAPHANEOUS VEIN; ULTRASOUND GUIDED;  Surgeon: Pryor Ochoa, MD;  Location: [facility] OR;  Service: Vascular;  Laterality: Left;   FLEXIBLE SIGMOIDOSCOPY N/A 12/10/2022    Procedure: FLEXIBLE SIGMOIDOSCOPY;  Surgeon: Jeani Hawking, MD;  Location: [facility];  Service: Gastroenterology;  Laterality: N/A;   INSERTION OF DIALYSIS CATHETER Right 04/30/2018   Procedure: INSERTION OF Right Internal Jugular DIALYSIS CATHETER;  Surgeon: Nada Libman, MD;  Location: [facility] OR;  Service: Vascular;  Laterality: Right;   IR FLUORO GUIDE CV LINE LEFT  05/01/2022   IR FLUORO GUIDE CV LINE LEFT  05/21/2022   IR FLUORO GUIDE CV LINE RIGHT  07/24/2020   IR FLUORO GUIDE CV LINE RIGHT  07/21/2021   IR FLUORO GUIDE CV LINE RIGHT  05/20/2022   IR PTA VENOUS EXCEPT DIALYSIS CIRCUIT  05/21/2022   IR REMOVAL TUN CV CATH W/O FL  07/21/2020   IR REMOVAL TUN CV CATH W/O FL  07/19/2021   IR REMOVAL TUN CV CATH W/O FL  05/17/2022   IR US GUIDE VASC ACCESS LEFT  05/21/2022   IR US GUIDE VASC ACCESS RIGHT  07/24/2020   IR US GUIDE VASC ACCESS RIGHT  07/21/2021   IR US GUIDE VASC ACCESS RIGHT  05/20/2022   IR VENOCAVAGRAM SVC  05/21/2022   LEFT HEART CATH AND CORONARY ANGIOGRAPHY N/A 05/01/2021   Procedure: LEFT HEART CATH AND CORONARY ANGIOGRAPHY;  Surgeon: Yates Decamp, MD;  Location: [facility] INVASIVE CV LAB;  Service: Cardiovascular;  Laterality: N/A;   LEFT HEART CATH AND CORONARY ANGIOGRAPHY N/A 05/02/2022   Procedure: LEFT HEART CATH AND CORONARY ANGIOGRAPHY;  Surgeon: Yates Decamp, MD;  Location: [facility] INVASIVE CV LAB;  Service: Cardiovascular;  Laterality: N/A;   LOWER EXTREMITY ANGIOGRAM Left 07/26/2013   Procedure: LOWER EXTREMITY ANGIOGRAM;  Surgeon: Fransisco Hertz, MD;  Location: [facility] CATH LAB;  Service: Cardiovascular;  Laterality: Left;      Social History:     Social History   Tobacco Use   Smoking status: Some Days    Current packs/day: 0.00    Average packs/day: 0.5 packs/day for 50.0 years (25.0 ttl pk-yrs)    Types: Cigarettes    Start date: 10/13/1972    Last attempt to quit: 10/13/2022    Years since quitting: 1.1   Smokeless tobacco: Former   Tobacco comments:    She is using a vape  when she runs out of her cigarettes, 7/27 - down to 1-2 cigs a day  Substance Use Topics   Alcohol use: Not Currently  Comment: she used to drink alcohol, quit in 2010; h/o heavy use         Family History :     Family History  Problem Relation Age of Onset   Cancer Mother 83       breast and bone   Cancer Father 1       prostate   Hypertension Sister    Bleeding Disorder Sister    Cancer Cousin 20       breast cancer    Hypertension Daughter    Breast cancer Neg Hx      Home Medications:   Prior to Admission medications   Medication Sig Start Date End Date Taking? Authorizing Provider  acetaminophen (TYLENOL) 325 MG tablet Take 650 mg by mouth as needed for moderate pain (pain score 4-6).   Yes [provider]  acetaminophen (TYLENOL) 650 MG CR tablet Take 1 tablet (650 mg total) by mouth 3 (three) times daily as needed (arthritis). Patient taking differently: Take 1,300 mg by mouth as needed for pain (arthritis). 09/18/23  Yes Arnette Felts, FNP  albuterol (VENTOLIN HFA) 108 (90 Base) MCG/ACT inhaler Inhale 2 puffs into the lungs every 6 (six) hours as needed for wheezing or shortness of breath. 09/18/23  Yes Arnette Felts, FNP  apixaban (ELIQUIS) 5 MG TABS tablet Take 1 tablet (5 mg total) by mouth 2 (two) times daily. 09/18/23  Yes Arnette Felts, FNP  meclizine (ANTIVERT) 12.5 MG tablet Take 1 tablet (12.5 mg total) by mouth 3 (three) times daily as needed for dizziness. Patient taking differently: Take 12.5 mg by mouth as needed for dizziness. 09/18/23  Yes Arnette Felts, FNP  multivitamin (RENA-VIT) TABS tablet Take 1 tablet by mouth at bedtime.   Yes [provider]  omeprazole (PRILOSEC) 20 MG capsule Take 1 capsule (20 mg total) by mouth daily. 09/18/23  Yes Arnette Felts, FNP  sevelamer carbonate (RENVELA) 800 MG tablet Take 2-4 tablets by mouth See admin instructions. Take 4 tablets with breakfast, lunch, and dinner. Take 2 tablets with a snack  10/27/23  Yes [provider]  sodium zirconium cyclosilicate (LOKELMA) 10 g PACK packet Take 10 g by mouth daily.   Yes [provider]  Tiotropium Bromide-Olodaterol (STIOLTO RESPIMAT) 2.5-2.5 MCG/ACT AERS Inhale 2 puffs into the lungs daily. 09/19/23  Yes Hunsucker, Lesia Sago, MD  vitamin E 200 UNIT capsule Take 200 Units by mouth daily.   Yes [provider]     Allergies:     Allergies  Allergen Reactions   Difelikefalin Itching   Carnosine Other (See Comments)     Unknown   Gadolinium Derivatives Hives and Other (See Comments)    HIVES, Desc: HIVES W/ "DYE" USED FOR 1ST CT SCAN BUT NOT 2ND, NO PREMEDS USED, PT UNCERTAIN OF CIRCUMSTANCES,,?POSSIBLE MRI CONTRAST ALLERGY, ALL STUDIES DONE "SOMEWHERE" IN [place]//A.C., Onset Date: 16109604   Iohexol Hives and Other (See Comments)    Desc: HIVES W/ "DYE" USED FOR 1ST CT SCAN BUT NOT 2ND, NO PREMEDS USED, PT UNCERTAIN OF CIRCUMSTANCES,,?POSSIBLE MRI CONTRAST ALLERGY, ALL STUDIES DONE "SOMEWHERE" IN [place]//A.C., Onset Date: [id number]    Iodinated Contrast Media Hives    1974   Iodine Hives    1974   Naltrexone Other (See Comments)    Unknown 1974      Physical Exam:   Vitals  Blood pressure 134/62, pulse 95, temperature 97.9 F (36.6 C), resp. rate 20, height 5\' 1"  (1.549 m), weight 90.2 kg, SpO2 95%.  1. General Pale female, laying in bed, in mild discomfort  2. Normal affect and insight, Not Suicidal or Homicidal, Awake Alert, Oriented X 3.  3. No F.N deficits, ALL C.Nerves Intact, Strength 5/5 all 4 extremities, Sensation intact all 4 extremities, Plantars down going.  4. Ears and Eyes appear Normal, Conjunctivae clear, PERRLA. Moist Oral Mucosa.  5. Supple Neck, No JVD, No cervical lymphadenopathy appriciated, No Carotid Bruits.  6. Symmetrical Chest wall movement, Good air movement bilaterally, CTAB.  7. RRR, No Gallops, Rubs or Murmurs, No Parasternal Heave.  8. Positive  Bowel Sounds, Abdomen Soft, she has epigastric tenderness to palpation, No organomegaly appriciated,No rebound -guarding or rigidity.  9.  No Cyanosis, Normal Skin Turgor, No Skin Rash or Bruise.  10. Good muscle tone,  joints appear normal , no effusions, Normal ROM.     Data Review:    CBC Recent Labs  Lab 12/01/23 1305  WBC 11.9*  HGB 14.1  HCT 44.3  PLT 221  MCV 92.5  MCH 29.4  MCHC 31.8  RDW 14.0  LYMPHSABS 2.3  MONOABS 0.6  EOSABS 0.3  BASOSABS 0.0   ------------------------------------------------------------------------------------------------------------------  Chemistries  Recent Labs  Lab 12/01/23 1305  NA 130*  K 7.4*  CL 98  CO2 16*  GLUCOSE 86  BUN 62*  CREATININE 13.38*  CALCIUM 9.3  AST 30  ALT 35  ALKPHOS 96  BILITOT 0.8   ------------------------------------------------------------------------------------------------------------------ estimated creatinine clearance is 4.1 mL/min (A) (by C-G formula based on SCr of 13.38 mg/dL (H)). ------------------------------------------------------------------------------------------------------------------ No results for input(s): "TSH", "T4TOTAL", "T3FREE", "THYROIDAB" in the last 72 hours.  Invalid input(s): "FREET3"  Coagulation profile No results for input(s): "INR", "PROTIME" in the last 168 hours. ------------------------------------------------------------------------------------------------------------------- No results for input(s): "DDIMER" in the last 72 hours. -------------------------------------------------------------------------------------------------------------------  Cardiac Enzymes No results for input(s): "CKMB", "TROPONINI", "MYOGLOBIN" in the last 168 hours.  Invalid input(s): "CK" ------------------------------------------------------------------------------------------------------------------    Component Value Date/Time   BNP 406.5 (H) 10/30/2020 0407      ---------------------------------------------------------------------------------------------------------------  Urinalysis    Component Value Date/Time   COLORURINE YELLOW 03/02/2019 1416   APPEARANCEUR CLEAR 03/02/2019 1416   LABSPEC 1.010 03/02/2019 1416   PHURINE 6.0 03/02/2019 1416   GLUCOSEU 50 (A) 03/02/2019 1416   HGBUR SMALL (A) 03/02/2019 1416   BILIRUBINUR NEGATIVE 03/02/2019 1416   KETONESUR 5 (A) 03/02/2019 1416   PROTEINUR 100 (A) 03/02/2019 1416   UROBILINOGEN 0.2 10/19/2015 1532   NITRITE NEGATIVE 03/02/2019 1416   LEUKOCYTESUR NEGATIVE 03/02/2019 1416    ----------------------------------------------------------------------------------------------------------------   Imaging Results:    No results found.   EKG:  Vent. rate 94 BPM PR interval 220 ms QRS duration 86 ms QT/QTcB 376/471 ms P-R-T axes 56 -75 82 Sinus rhythm Prolonged PR interval Left anterior fascicular block Abnormal R-wave progression, late transition ST elevation, consider inferior injury  Assessment & Plan:    Active Problems:   Chronic diastolic heart failure (HCC)   Anemia of chronic kidney failure, stage 3 (moderate) (HCC)   OSA (obstructive sleep apnea)   Abdominal pain   Atherosclerosis of aorta (HCC)   Class 2 obesity due to excess calories with body mass index (BMI) of 36.0 to 36.9 in adult   Hyperkalemia   Hyperkalemia -She did miss her HD today, thinks she did finish her full HD session this Friday, but reports it has been challenging given her she was under her dry weight -She received IV calcium, IV insulin with D50 and Lokelma -Renal consulted, patient for emergent  HD -Patient with EKG changes, peaked T waves will repeat EKG this evening after emergent HD. -Monitor on telemetry -Repeat renal panel in 4 hours  Abdominal pain, nausea, diarrhea -No clear etiology, she denies any sick contacts, -Will obtain CT abdomen pelvis with oral contrast after her HD as  currently her hyperkalemia takes priority -Hold on Zofran till hyperkalemia is corrected -Check lipase level -Continue with PPI -Check C. difficile and GI panel  ESRD -Please see above discussion, she is for emergent dialysis this afternoon  History of recurrent VTE -Continue with Eliquis  Hypertension -Will keep on as needed hydralazine  OSA -Not on CPAP at home  Hyperlipidemia -Continue with statin    DVT Prophylaxis H on Eliquis  AM Labs Ordered, also please review Full Orders  Family Communication: Admission, patients condition and plan of care including tests being ordered have been discussed with the patient who indicate understanding and agree with the plan and Code Status.  Code Status full code  Likely DC to home  Consults called: Renal  Admission status: Inpatient  Time spent in minutes : 70 minutes   Huey Bienenstock M.D on 12/01/2023 at 4:04 PM   [facility] - Office  [phone]

## 2023-12-01 NOTE — ED Provider Notes (Addendum)
[place] EMERGENCY DEPARTMENT AT [facility] Provider Note   CSN: [id number] Arrival date & time: 12/01/23  1146     History  Chief Complaint  Patient presents with   Diarrhea    Whitney Walker is a 67 y.o. female with PMHx HTN, CHF, COPD, CAD, ESRD on dialysis who presents to ED concerned for nausea, diarrhea, chills x3 days. Patient with M/W/F dialysis schedule but could not go today d/t the diarrhea. Last dialysis Friday and diarrhea symptoms started after that session. Last episode of diarrhea around 3 hours ago.  Denies recent ABX use, hx of crohn's/IBS, recent travel, drinking from unsafe water sources, immunocompromise, suspicious food intake. Denies fever, chest pain, cough, vomiting, hematochezia.    Diarrhea      Home Medications Prior to Admission medications   Medication Sig Start Date End Date Taking? Authorizing Provider  acetaminophen (TYLENOL) 325 MG tablet Take 650 mg by mouth as needed for moderate pain (pain score 4-6).   Yes [provider]  acetaminophen (TYLENOL) 650 MG CR tablet Take 1 tablet (650 mg total) by mouth 3 (three) times daily as needed (arthritis). Patient taking differently: Take 1,300 mg by mouth as needed for pain (arthritis). 09/18/23  Yes Arnette Felts, FNP  albuterol (VENTOLIN HFA) 108 (90 Base) MCG/ACT inhaler Inhale 2 puffs into the lungs every 6 (six) hours as needed for wheezing or shortness of breath. 09/18/23  Yes Arnette Felts, FNP  apixaban (ELIQUIS) 5 MG TABS tablet Take 1 tablet (5 mg total) by mouth 2 (two) times daily. 09/18/23  Yes Arnette Felts, FNP  meclizine (ANTIVERT) 12.5 MG tablet Take 1 tablet (12.5 mg total) by mouth 3 (three) times daily as needed for dizziness. Patient taking differently: Take 12.5 mg by mouth as needed for dizziness. 09/18/23  Yes Arnette Felts, FNP  multivitamin (RENA-VIT) TABS tablet Take 1 tablet by mouth at bedtime.   Yes [provider]  omeprazole (PRILOSEC) 20  MG capsule Take 1 capsule (20 mg total) by mouth daily. 09/18/23  Yes Arnette Felts, FNP  sevelamer carbonate (RENVELA) 800 MG tablet Take 2-4 tablets by mouth See admin instructions. Take 4 tablets with breakfast, lunch, and dinner. Take 2 tablets with a snack 10/27/23  Yes [provider]  sodium zirconium cyclosilicate (LOKELMA) 10 g PACK packet Take 10 g by mouth daily.   Yes [provider]  Tiotropium Bromide-Olodaterol (STIOLTO RESPIMAT) 2.5-2.5 MCG/ACT AERS Inhale 2 puffs into the lungs daily. 09/19/23  Yes Hunsucker, Lesia Sago, MD  vitamin E 200 UNIT capsule Take 200 Units by mouth daily.   Yes [provider]      Allergies    Difelikefalin, Carnosine, Gadolinium derivatives, Iohexol, Iodinated contrast media, Iodine, and Naltrexone    Review of Systems   Review of Systems  Gastrointestinal:  Positive for diarrhea.    Physical Exam Updated Vital Signs BP (!) 124/50   Pulse 87   Temp 97.9 F (36.6 C)   Resp 19   Ht 5\' 1"  (1.549 m)   Wt 90.2 kg   SpO2 96%   BMI 37.57 kg/m  Physical Exam Vitals and nursing note reviewed.  Constitutional:      General: She is not in acute distress.    Appearance: She is not ill-appearing or toxic-appearing.  HENT:     Head: Normocephalic and atraumatic.     Mouth/Throat:     Mouth: Mucous membranes are moist.     Pharynx: No oropharyngeal exudate or posterior  oropharyngeal erythema.  Eyes:     General: No scleral icterus.       Right eye: No discharge.        Left eye: No discharge.     Conjunctiva/sclera: Conjunctivae normal.  Cardiovascular:     Rate and Rhythm: Normal rate and regular rhythm.     Pulses: Normal pulses.     Heart sounds: Normal heart sounds. No murmur heard. Pulmonary:     Effort: Pulmonary effort is normal. No respiratory distress.     Breath sounds: Normal breath sounds. No wheezing, rhonchi or rales.  Abdominal:     General: Abdomen is flat. Bowel sounds are normal.      Palpations: Abdomen is soft.     Tenderness: There is no abdominal tenderness.  Musculoskeletal:     Right lower leg: No edema.     Left lower leg: No edema.  Skin:    General: Skin is warm and dry.     Findings: No rash.  Neurological:     General: No focal deficit present.     Mental Status: She is alert. Mental status is at baseline.  Psychiatric:        Mood and Affect: Mood normal.     ED Results / Procedures / Treatments   Labs (all labs ordered are listed, but only abnormal results are displayed) Labs Reviewed  COMPREHENSIVE METABOLIC PANEL - Abnormal; Notable for the following components:      Result Value   Sodium 130 (*)    Potassium 7.4 (*)    CO2 16 (*)    BUN 62 (*)    Creatinine, Ser 13.38 (*)    Total Protein 8.6 (*)    GFR, Estimated 3 (*)    Anion gap 16 (*)    All other components within normal limits  LIPASE, BLOOD - Abnormal; Notable for the following components:   Lipase 85 (*)    All other components within normal limits  CBC WITH DIFFERENTIAL/PLATELET - Abnormal; Notable for the following components:   WBC 11.9 (*)    Neutro Abs 8.6 (*)    All other components within normal limits  RESP PANEL BY RT-PCR (RSV, FLU A&B, COVID)  RVPGX2  HEPATITIS B SURFACE ANTIGEN  HEPATITIS B SURFACE ANTIBODY, QUANTITATIVE  CBG MONITORING, ED    EKG None  Radiology No results found.  Procedures .Critical Care  Performed by: Dorthy Cooler, PA-C Authorized by: Dorthy Cooler, PA-C   Critical care provider statement:    Critical care time (minutes):  30   Critical care was necessary to treat or prevent imminent or life-threatening deterioration of the following conditions: hyperkalemia.   Critical care was time spent personally by me on the following activities:  Development of treatment plan with patient or surrogate, discussions with consultants, evaluation of patient's response to treatment, examination of patient, ordering and review of  laboratory studies, ordering and review of radiographic studies, ordering and performing treatments and interventions, pulse oximetry, re-evaluation of patient's condition and review of old charts   Care discussed with: admitting provider   Comments:     Hyperkalemia with peaked T waves     Medications Ordered in ED Medications  ondansetron (ZOFRAN) injection 4 mg (4 mg Intravenous Given 12/01/23 1358)  sodium zirconium cyclosilicate (LOKELMA) packet 10 g (has no administration in time range)  albuterol (PROVENTIL) (2.5 MG/3ML) 0.083% nebulizer solution 10 mg (has no administration in time range)  insulin aspart (novoLOG) injection 5 Units (has  no administration in time range)    And  dextrose 50 % solution 50 mL (50 mLs Intravenous Given 12/01/23 1540)  Chlorhexidine Gluconate Cloth 2 % PADS 6 each (has no administration in time range)  calcium gluconate inj 10% (1 g) URGENT USE ONLY! (1 g Intravenous Given 12/01/23 1536)    ED Course/ Medical Decision Making/ A&P Clinical Course as of 12/01/23 1542  Mon Dec 01, 2023  1526 Potassium(!!): 7.4 Peaked t-waves noted on EKG, missed HD, start hyperK protocol, pt needs HD, nephro callback pending  [SG]    Clinical Course User Index [SG] Sloan Leiter, DO                                 Medical Decision Making Amount and/or Complexity of Data Reviewed Labs: ordered. Decision-making details documented in ED Course.  Risk OTC drugs. Prescription drug management. Decision regarding hospitalization.   This patient presents to the ED for concern of diarrhea, this involves an extensive number of treatment options, and is a complaint that carries with it a high risk of complications and morbidity.  The differential diagnosis includes food poisoning, gastroenteritis, C.Diff, STEC, other bacterial cause, protozoa, viral infection, IBS, Crohn's disease.    Co morbidities that complicate the patient evaluation   HTN, CHF, COPD, CAD, ESRD on  dialysis   Additional history obtained:  Dr. Christell Constant PCP   Lab Tests:  I Ordered, and personally interpreted labs.  The pertinent results include:   -CBC: mild leukocytosis at 11.9; no anemia -CMP: hyperkalemia at 7.4; hyponatremia at 130 -Lipase: 85 -Resp panel: negative   Cardiac Monitoring: / EKG:  The patient was maintained on a cardiac monitor.  I personally viewed and interpreted the cardiac monitored which showed an underlying rhythm of: peaked T-waves    Problem List / ED Course / Critical interventions / Medication management  Patient presents to ED concern for diarrhea x 3 days.  This diarrhea prevented patient from going to dialysis today.  Last dialysis treatment was Friday.  Physical exam reassuring.  Patient afebrile with stable vitals. Respiratory panel negative.  CBC with mild leukocytosis 11.9.  No anemia.  Lipase elevated 85.  CMP with hyperkalemia 7.4 and hyponatremia 130.  EKG with peaked T waves.  Started patient on treatment for hyperkalemia. Consulted with Dr. Ronalee Belts (Nephrology) who agrees to see patient in ED for urgent dialysis. Dr. Randol Kern admitting provider.  I have reviewed the patients home medicines and have made adjustments as needed   Social Determinants of Health:  none         Final Clinical Impression(s) / ED Diagnoses Final diagnoses:  Hyperkalemia    Rx / DC Orders ED Discharge Orders     None          Dorthy Cooler, [place] 12/01/23 1556    Sloan Leiter, DO 12/05/23 [phone]

## 2023-12-01 NOTE — ED Notes (Signed)
ED TO INPATIENT HANDOFF REPORT  ED Nurse Name and Phone #: Jesse Sans, [phone]  S Name/Age/Gender Whitney Walker 67 y.o. female Room/Bed: 032C/032C  Code Status   Code Status: Prior  Home/SNF/Other Home Patient oriented to: self, place, time, and situation Is this baseline? Yes   Triage Complete: Triage complete  Chief Complaint Hyperkalemia [E87.5]  Triage Note Pt c/o diarrhea, nausea, chillsx3d. Pt dialysis M, W, Fri. Pt missed dialysis today due to diarrhea. PT last went to dialysis on Fri.   Allergies Allergies  Allergen Reactions   Difelikefalin Itching   Carnosine Other (See Comments)     Unknown   Gadolinium Derivatives Hives and Other (See Comments)    HIVES, Desc: HIVES W/ "DYE" USED FOR 1ST CT SCAN BUT NOT 2ND, NO PREMEDS USED, PT UNCERTAIN OF CIRCUMSTANCES,,?POSSIBLE MRI CONTRAST ALLERGY, ALL STUDIES DONE "SOMEWHERE" IN [place]//A.C., Onset Date: [id number]   Iohexol Hives and Other (See Comments)    Desc: HIVES W/ "DYE" USED FOR 1ST CT SCAN BUT NOT 2ND, NO PREMEDS USED, PT UNCERTAIN OF CIRCUMSTANCES,,?POSSIBLE MRI CONTRAST ALLERGY, ALL STUDIES DONE "SOMEWHERE" IN [place]//A.C., Onset Date: [id number]    Iodinated Contrast Media Hives    1974   Iodine Hives    1974   Naltrexone Other (See Comments)    Unknown 1974     Level of Care/Admitting Diagnosis ED Disposition     ED Disposition  Admit   Condition  --   Comment  Hospital Area: [facility] [[id number]]  Level of Care: Progressive [102]  Admit to Progressive based on following criteria: Other see comments  Comments: HYPERKALEMAI  May admit patient to [facility] or [facility] if equivalent level of care is available:: No  Covid Evaluation: Confirmed COVID Negative  Diagnosis: Hyperkalemia [528413]  Admitting Physician: Chiquita Loth  Attending Physician: Starleen Arms [[id number]]  Bed request comments: 5 WEST  Certification:: I certify this patient  will need inpatient services for at least 2 midnights  Expected Medical Readiness: 12/03/2023          B Medical/Surgery History Past Medical History:  Diagnosis Date   Anemia    Anxiety    CHF (congestive heart failure) (HCC)    Colon cancer (HCC)    treatment surgery   Complication of anesthesia    after first [facility]- [place] "couldnt walk after", patient denies having a spinal   COPD (chronic obstructive pulmonary disease) (HCC)    Coronary artery disease    Depression    DVT (deep venous thrombosis) (HCC)    Encounter for annual health examination 08/05/2023   ESRD on hemodialysis (HCC)    Hemo: MWF   History of blood transfusion 04/2018   Hypertension    07/07/18- no longer takes BP medications   Leucocytosis 04/28/2012   Meningitis    Pain in limb 07/30/2013   PE (pulmonary embolism)    Peripheral vascular disease (HCC)    Primary hypertension 11/15/2013   Restless legs    Shortness of breath    with exertion   Sleep apnea    SOB (shortness of breath) 03/03/2019   Vertigo    Past Surgical History:  Procedure Laterality Date   ABDOMINAL AORTAGRAM N/A 07/26/2013   Procedure: ABDOMINAL Ronny Flurry;  Surgeon: Fransisco Hertz, MD;  Location: [facility] CATH LAB;  Service: Cardiovascular;  Laterality: N/A;   ABDOMINAL HYSTERECTOMY     AV FISTULA PLACEMENT Left 05/05/2018   Procedure: ARTERIOVENOUS (AV) FISTULA CREATION BRACHIOCEPHALIC;  Surgeon:  Maeola Harman, MD;  Location: [facility] OR;  Service: Vascular;  Laterality: Left;   AV FISTULA PLACEMENT Left 07/16/2018   Procedure: ARTERIOVENOUS FISTULA CREATION;  Surgeon: Maeola Harman, MD;  Location: [facility] OR;  Service: Vascular;  Laterality: Left;   BASCILIC VEIN TRANSPOSITION Left 09/03/2018   Procedure: BASILIC VEIN TRANSPOSITION SECOND STAGE;  Surgeon: Maeola Harman, MD;  Location: [facility] OR;  Service: Vascular;  Laterality: Left;   BREAST BIOPSY Left    CESAREAN SECTION     X 3 1974-1977   CHOLECYSTECTOMY      CHOLECYSTECTOMY  1980/s   COLECTOMY  2010   DIVERTICULOSIS SURGERY-2002  2012   FEMORAL-POPLITEAL BYPASS GRAFT Left 08/13/2013   Procedure: BYPASS GRAFT FEMORAL-POPLITEAL ARTERY WITH NON-REVERSED SAPHANEOUS VEIN; ULTRASOUND GUIDED;  Surgeon: Pryor Ochoa, MD;  Location: [facility] OR;  Service: Vascular;  Laterality: Left;   FLEXIBLE SIGMOIDOSCOPY N/A 12/10/2022   Procedure: FLEXIBLE SIGMOIDOSCOPY;  Surgeon: Jeani Hawking, MD;  Location: [facility];  Service: Gastroenterology;  Laterality: N/A;   INSERTION OF DIALYSIS CATHETER Right 04/30/2018   Procedure: INSERTION OF Right Internal Jugular DIALYSIS CATHETER;  Surgeon: Nada Libman, MD;  Location: [facility] OR;  Service: Vascular;  Laterality: Right;   IR FLUORO GUIDE CV LINE LEFT  05/01/2022   IR FLUORO GUIDE CV LINE LEFT  05/21/2022   IR FLUORO GUIDE CV LINE RIGHT  07/24/2020   IR FLUORO GUIDE CV LINE RIGHT  07/21/2021   IR FLUORO GUIDE CV LINE RIGHT  05/20/2022   IR PTA VENOUS EXCEPT DIALYSIS CIRCUIT  05/21/2022   IR REMOVAL TUN CV CATH W/O FL  07/21/2020   IR REMOVAL TUN CV CATH W/O FL  07/19/2021   IR REMOVAL TUN CV CATH W/O FL  05/17/2022   IR US GUIDE VASC ACCESS LEFT  05/21/2022   IR US GUIDE VASC ACCESS RIGHT  07/24/2020   IR US GUIDE VASC ACCESS RIGHT  07/21/2021   IR US GUIDE VASC ACCESS RIGHT  05/20/2022   IR VENOCAVAGRAM SVC  05/21/2022   LEFT HEART CATH AND CORONARY ANGIOGRAPHY N/A 05/01/2021   Procedure: LEFT HEART CATH AND CORONARY ANGIOGRAPHY;  Surgeon: Yates Decamp, MD;  Location: [facility] INVASIVE CV LAB;  Service: Cardiovascular;  Laterality: N/A;   LEFT HEART CATH AND CORONARY ANGIOGRAPHY N/A 05/02/2022   Procedure: LEFT HEART CATH AND CORONARY ANGIOGRAPHY;  Surgeon: Yates Decamp, MD;  Location: [facility] INVASIVE CV LAB;  Service: Cardiovascular;  Laterality: N/A;   LOWER EXTREMITY ANGIOGRAM Left 07/26/2013   Procedure: LOWER EXTREMITY ANGIOGRAM;  Surgeon: Fransisco Hertz, MD;  Location: [facility] CATH LAB;  Service: Cardiovascular;  Laterality: Left;     A IV  Location/Drains/Wounds Patient Lines/Drains/Airways Status     Active Line/Drains/Airways     Name Placement date Placement time Site Days   Peripheral IV 12/01/23 20 G Right Antecubital 12/01/23  1306  Antecubital  less than 1   Fistula / Graft Left Upper arm Arteriovenous fistula 05/05/18  1009  Upper arm  2036   Fistula / Graft Left Upper arm Arteriovenous fistula 07/16/18  1412  Upper arm  1964   Fistula / Graft Left Upper arm Arteriovenous fistula --  --  Upper arm  --   Hemodialysis Catheter Left Internal jugular Double lumen Permanent (Tunneled) 05/21/22  1550  Internal jugular  559            Intake/Output Last 24 hours No intake or output data in the 24 hours ending 12/01/23 1555  Labs/Imaging Results  for orders placed or performed during the hospital encounter of 12/01/23 (from the past 48 hour(s))  Comprehensive metabolic panel     Status: Abnormal   Collection Time: 12/01/23  1:05 PM  Result Value Ref Range   Sodium 130 (L) 135 - 145 mmol/L   Potassium 7.4 (HH) 3.5 - 5.1 mmol/L    Comment: CRITICAL RESULT CALLED TO, READ BACK BY AND VERIFIED WITH Natika Geyer RN.@1443  ON 12.2.24 BY TCALDWELL MT.   Chloride 98 98 - 111 mmol/L   CO2 16 (L) 22 - 32 mmol/L   Glucose, Bld 86 70 - 99 mg/dL    Comment: Glucose reference range applies only to samples taken after fasting for at least 8 hours.   BUN 62 (H) 8 - 23 mg/dL   Creatinine, Ser 16.10 (H) 0.44 - 1.00 mg/dL   Calcium 9.3 8.9 - 96.0 mg/dL   Total Protein 8.6 (H) 6.5 - 8.1 g/dL   Albumin 3.8 3.5 - 5.0 g/dL   AST 30 15 - 41 U/L   ALT 35 0 - 44 U/L   Alkaline Phosphatase 96 38 - 126 U/L   Total Bilirubin 0.8 <1.2 mg/dL   GFR, Estimated 3 (L) >60 mL/min    Comment: (NOTE) Calculated using the CKD-EPI Creatinine Equation (2021)    Anion gap 16 (H) 5 - 15    Comment: Performed at [facility], [address] [place], [place] [address]  Lipase, blood     Status: Abnormal   Collection Time: 12/01/23  1:05 PM   Result Value Ref Range   Lipase 85 (H) 11 - 51 U/L    Comment: Performed at [facility], [address], [place], [place] [address]  CBC with Diff     Status: Abnormal   Collection Time: 12/01/23  1:05 PM  Result Value Ref Range   WBC 11.9 (H) 4.0 - 10.5 K/uL   RBC 4.79 3.87 - 5.11 MIL/uL   Hemoglobin 14.1 12.0 - 15.0 g/dL   HCT 47.8 29.5 - 62.1 %   MCV 92.5 80.0 - 100.0 fL   MCH 29.4 26.0 - 34.0 pg   MCHC 31.8 30.0 - 36.0 g/dL   RDW 30.8 65.7 - 84.6 %   Platelets 221 150 - 400 K/uL   nRBC 0.0 0.0 - 0.2 %   Neutrophils Relative % 72 %   Neutro Abs 8.6 (H) 1.7 - 7.7 K/uL   Lymphocytes Relative 19 %   Lymphs Abs 2.3 0.7 - 4.0 K/uL   Monocytes Relative 5 %   Monocytes Absolute 0.6 0.1 - 1.0 K/uL   Eosinophils Relative 3 %   Eosinophils Absolute 0.3 0.0 - 0.5 K/uL   Basophils Relative 0 %   Basophils Absolute 0.0 0.0 - 0.1 K/uL   Immature Granulocytes 1 %   Abs Immature Granulocytes 0.06 0.00 - 0.07 K/uL    Comment: Performed at [facility], [address] [place], [place] [address]  Resp panel by RT-PCR (RSV, Flu A&B, Covid) Anterior Nasal Swab     Status: None   Collection Time: 12/01/23  1:55 PM   Specimen: Anterior Nasal Swab  Result Value Ref Range   SARS Coronavirus 2 by RT PCR NEGATIVE NEGATIVE   Influenza A by PCR NEGATIVE NEGATIVE   Influenza B by PCR NEGATIVE NEGATIVE    Comment: (NOTE) The Xpert Xpress SARS-CoV-2/FLU/RSV plus assay is intended as an aid in the diagnosis of influenza from Nasopharyngeal swab specimens and  should not be used as a sole basis for treatment. Nasal washings and aspirates are unacceptable for Xpert Xpress SARS-CoV-2/FLU/RSV testing.  Fact Sheet for Patients: BloggerCourse.com  Fact Sheet for Healthcare Providers: SeriousBroker.it  This test is not yet approved or cleared by the [place] FDA and has been authorized for detection and/or diagnosis of SARS-CoV-2  by FDA under an Emergency Use Authorization (EUA). This EUA will remain in effect (meaning this test can be used) for the duration of the COVID-19 declaration under Section 564(b)(1) of the Act, 21 U.S.C. section 360bbb-3(b)(1), unless the authorization is terminated or revoked.     Resp Syncytial Virus by PCR NEGATIVE NEGATIVE    Comment: (NOTE) Fact Sheet for Patients: BloggerCourse.com  Fact Sheet for Healthcare Providers: SeriousBroker.it  This test is not yet approved or cleared by the [place] FDA and has been authorized for detection and/or diagnosis of SARS-CoV-2 by FDA under an Emergency Use Authorization (EUA). This EUA will remain in effect (meaning this test can be used) for the duration of the COVID-19 declaration under Section 564(b)(1) of the Act, 21 U.S.C. section 360bbb-3(b)(1), unless the authorization is terminated or revoked.  Performed at [facility], [address] [place], [place] [address]   CBG monitoring, ED     Status: None   Collection Time: 12/01/23  3:31 PM  Result Value Ref Range   Glucose-Capillary 79 70 - 99 mg/dL    Comment: Glucose reference range applies only to samples taken after fasting for at least 8 hours.   No results found.  Pending Labs [employer] (From admission, onward)     Start     Ordered   12/01/23 1543  Hepatitis B surface antibody,quantitative  (New Admission Hemo Labs (Hepatitis B))  ONCE - URGENT,   URGENT        12/01/23 1543   12/01/23 1543  Hepatitis B surface antigen  (New Admission Hemo Labs (Hepatitis B))  ONCE - URGENT,   URGENT        12/01/23 1543   Signed and Held  Renal function panel  Once,   R        Signed and Held   Signed and Held  CBC  Once,   R        Signed and Held            Vitals/Pain Today's Vitals   12/01/23 1315 12/01/23 1400 12/01/23 1430 12/01/23 1545  BP: (!) 159/91 (!) 123/51 (!) 124/50 134/62  Pulse: 85 72 87  95  Resp: 19 19 19 20   Temp:      SpO2: 95% 97% 96% 95%  Weight:      Height:      PainSc:        Isolation Precautions No active isolations  Medications Medications  albuterol (PROVENTIL) (2.5 MG/3ML) 0.083% nebulizer solution 10 mg (has no administration in time range)  Chlorhexidine Gluconate Cloth 2 % PADS 6 each (has no administration in time range)  sodium zirconium cyclosilicate (LOKELMA) packet 10 g (10 g Oral Given 12/01/23 1543)  calcium gluconate inj 10% (1 g) URGENT USE ONLY! (1 g Intravenous Given 12/01/23 1536)  insulin aspart (novoLOG) injection 5 Units (5 Units Intravenous Given 12/01/23 1543)    And  dextrose 50 % solution 50 mL (50 mLs Intravenous Given 12/01/23 1540)    Mobility walks with person assist     Focused Assessments Neuro Assessment Handoff:  Swallow screen pass? Yes  Neuro Assessment: Within Defined Limits Neuro Checks:      Has TPA been given? No If patient is a Neuro Trauma and patient is going to OR before floor call report to 4N Charge nurse: [phone] or [phone]   R Recommendations: See Admitting Provider Note  Report given to:   Additional Notes: pt is AAOx4. Pt is on room air.

## 2023-12-02 DIAGNOSIS — D631 Anemia in chronic kidney disease: Secondary | ICD-10-CM

## 2023-12-02 DIAGNOSIS — N183 Chronic kidney disease, stage 3 unspecified: Secondary | ICD-10-CM | POA: Diagnosis not present

## 2023-12-02 DIAGNOSIS — Z91158 Patient's noncompliance with renal dialysis for other reason: Secondary | ICD-10-CM

## 2023-12-02 DIAGNOSIS — Z6836 Body mass index (BMI) 36.0-36.9, adult: Secondary | ICD-10-CM

## 2023-12-02 DIAGNOSIS — I700 Atherosclerosis of aorta: Secondary | ICD-10-CM

## 2023-12-02 DIAGNOSIS — I5032 Chronic diastolic (congestive) heart failure: Secondary | ICD-10-CM

## 2023-12-02 DIAGNOSIS — E66812 Obesity, class 2: Secondary | ICD-10-CM

## 2023-12-02 DIAGNOSIS — R1013 Epigastric pain: Secondary | ICD-10-CM | POA: Diagnosis not present

## 2023-12-02 DIAGNOSIS — E875 Hyperkalemia: Secondary | ICD-10-CM | POA: Diagnosis not present

## 2023-12-02 DIAGNOSIS — G4733 Obstructive sleep apnea (adult) (pediatric): Secondary | ICD-10-CM

## 2023-12-02 LAB — RENAL FUNCTION PANEL
Albumin: 3.2 g/dL — ABNORMAL LOW (ref 3.5–5.0)
Anion gap: 20 — ABNORMAL HIGH (ref 5–15)
BUN: 62 mg/dL — ABNORMAL HIGH (ref 8–23)
CO2: 15 mmol/L — ABNORMAL LOW (ref 22–32)
Calcium: 8.9 mg/dL (ref 8.9–10.3)
Chloride: 98 mmol/L (ref 98–111)
Creatinine, Ser: 13.13 mg/dL — ABNORMAL HIGH (ref 0.44–1.00)
GFR, Estimated: 3 mL/min — ABNORMAL LOW (ref >60)
Glucose, Bld: 54 mg/dL — ABNORMAL LOW (ref 70–99)
Phosphorus: 7.3 mg/dL — ABNORMAL HIGH (ref 2.5–4.6)
Potassium: 6.4 mmol/L (ref 3.5–5.1)
Sodium: 133 mmol/L — ABNORMAL LOW (ref 135–145)

## 2023-12-02 LAB — CBC
HCT: 36.4 % (ref 36.0–46.0)
Hemoglobin: 11.5 g/dL — ABNORMAL LOW (ref 12.0–15.0)
MCH: 29.1 pg (ref 26.0–34.0)
MCHC: 31.6 g/dL (ref 30.0–36.0)
MCV: 92.2 fL (ref 80.0–100.0)
Platelets: 166 10*3/uL (ref 150–400)
RBC: 3.95 MIL/uL (ref 3.87–5.11)
RDW: 14.1 % (ref 11.5–15.5)
WBC: 9.7 10*3/uL (ref 4.0–10.5)
nRBC: 0 % (ref 0.0–0.2)

## 2023-12-02 LAB — BASIC METABOLIC PANEL
Anion gap: 13 (ref 5–15)
BUN: 27 mg/dL — ABNORMAL HIGH (ref 8–23)
CO2: 23 mmol/L (ref 22–32)
Calcium: 8.1 mg/dL — ABNORMAL LOW (ref 8.9–10.3)
Chloride: 93 mmol/L — ABNORMAL LOW (ref 98–111)
Creatinine, Ser: 7.27 mg/dL — ABNORMAL HIGH (ref 0.44–1.00)
GFR, Estimated: 6 mL/min — ABNORMAL LOW (ref >60)
Glucose, Bld: 103 mg/dL — ABNORMAL HIGH (ref 70–99)
Potassium: 4.6 mmol/L (ref 3.5–5.1)
Sodium: 129 mmol/L — ABNORMAL LOW (ref 135–145)

## 2023-12-02 LAB — HEPATITIS PANEL, ACUTE
HCV Ab: NONREACTIVE
Hep A IgM: NONREACTIVE
Hep B C IgM: NONREACTIVE
Hepatitis B Surface Ag: NONREACTIVE

## 2023-12-02 LAB — HIV ANTIBODY (ROUTINE TESTING W REFLEX): HIV Screen 4th Generation wRfx: NONREACTIVE

## 2023-12-02 LAB — HEPATITIS B SURFACE ANTIBODY, QUANTITATIVE: Hep B S AB Quant (Post): 20699 m[IU]/mL

## 2023-12-02 MED ORDER — MIDODRINE HCL 5 MG PO TABS
10.0000 mg | ORAL_TABLET | Freq: Once | ORAL | Status: AC
  Administered 2023-12-02: 10 mg via ORAL
  Filled 2023-12-02: qty 2

## 2023-12-02 MED ORDER — SODIUM ZIRCONIUM CYCLOSILICATE 10 G PO PACK
10.0000 g | PACK | Freq: Once | ORAL | Status: AC
  Administered 2023-12-02: 10 g via ORAL
  Filled 2023-12-02: qty 1

## 2023-12-02 MED ORDER — MIDODRINE HCL 10 MG PO TABS: 5.0000 mg | ORAL_TABLET | Freq: Two times a day (BID) | ORAL | 0 refills | Status: DC

## 2023-12-02 MED ORDER — MIDODRINE HCL 5 MG PO TABS
10.0000 mg | ORAL_TABLET | Freq: Three times a day (TID) | ORAL | Status: DC
  Administered 2023-12-02: 10 mg via ORAL
  Filled 2023-12-02: qty 2

## 2023-12-02 MED ORDER — LACTATED RINGERS IV BOLUS: 250.0000 mL | Freq: Once | INTRAVENOUS | Status: DC

## 2023-12-02 NOTE — Progress Notes (Addendum)
   12/02/23 0042  Vitals  Temp 98.3 F (36.8 C)  Temp Source Oral  BP (!) 75/36  MAP (mmHg) (!) 48  BP Location Right Wrist  BP Method Automatic  Patient Position (if appropriate) Lying  Pulse Rate (!) 111  Pulse Rate Source Monitor  Resp 18  During Treatment Monitoring  Blood Flow Rate (mL/min) 0 mL/min  Arterial Pressure (mmHg) -0.8 mmHg  Venous Pressure (mmHg) -2.02 mmHg  TMP (mmHg) 42.22 mmHg  Ultrafiltration Rate (mL/min) 191 mL/min  Dialysate Flow Rate (mL/min) 299 ml/min  Dialysate Potassium Concentration 2  Dialysate Calcium Concentration 2.5  Duration of HD Treatment -hour(s) 3.13 hour(s)  Cumulative Fluid Removed (mL) per Treatment  176.33  HD Safety Checks Performed Yes  Intra-Hemodialysis Comments Tx completed  Post Treatment  Dialyzer Clearance Lightly streaked  Liters Processed 60.8  Fluid Removed (mL) 200 mL  Tolerated HD Treatment Yes  Post-Hemodialysis Comments Pt stable (Pt request zero UF)   Pt return to the E/D stable. Pt signed 30 minutes early due to cramping in lower extremities.

## 2023-12-02 NOTE — ED Notes (Signed)
Pt returned back to room from dialysis.

## 2023-12-02 NOTE — ED Notes (Signed)
This RN attempted to put O2 on the pt due to pts O2 at 80%. Pt refused and states she has sleep apnea but does not want to wear CPAP.

## 2023-12-02 NOTE — ED Notes (Signed)
This RN attempted  to administer the ordered IV fluids as ordered. Pt refused. MD made aware.

## 2023-12-02 NOTE — Progress Notes (Signed)
TRH night cross cover note:   I was notified by RN that the patient's morning potassium level is 6.5.   This is down from her presenting value of 7.4 with interval hemodialysis overnight.  Nephrology continuing to follow this patient with end-stage renal disease, with potential for additional dialysis today.   In the meantime I have ordered Lokelma 10 g p.o. x 1 dose now, a repeat EKG, as well as a follow-up renal function panel to be checked around 9 AM this morning.    Newton Pigg, DO Hospitalist

## 2023-12-02 NOTE — ED Notes (Addendum)
Patient refuse her 8 am Midodrine

## 2023-12-02 NOTE — ED Notes (Signed)
This RN attempted to offer lactated ringers bolus due to pt's low blood pressure and pt is refusing.

## 2023-12-02 NOTE — ED Notes (Signed)
Pt c/o of pain and this RN offered pain medicine and pt refused.

## 2023-12-02 NOTE — ED Notes (Signed)
Report received from [facility] in dialysis. He informed me pt was hypotensive around 90s/50s throughout dialysis, but asymptomatic. He states he administered PRN dilaudid for chronic back pain. Pt was on dialysis for 3 hours and 8 mins. Refused the full 3.5 hours of dialysis. Per RN, no fluid was drained from pt.

## 2023-12-02 NOTE — Discharge Summary (Signed)
Physician Discharge Summary  Whitney Walker WRU:[id number] DOB: 12/10/1956 DOA: 12/01/2023  PCP: Whitney Felts, FNP  Admit date: 12/01/2023 Discharge date: 12/02/2023  Admitted From: Home Disposition:  Home  Recommendations for Outpatient Follow-up:  Follow up with PCP in 1-2 weeks Continue HD with nephro as scheduled:  Discharge Condition:stable  CODE STATUS:Full  Diet recommendation: Renal diet   Brief/Interim Summary: Whitney Walker  is a 67 y.o. female, with past medical history of hypertension, chronic diastolic CHF, COPD, CAD, ESRD, history of colon cancer in the past, in remission, multiple DVTs, on chronic anticoagulation, osteoarthritis with chronic left hip pain, depression who presents with abdominal pain and diarrhea - now resolved. In ED K noted to be markedly elevated due to missed dialysis (patient noncompliance) - now s/p HD with normalizing K. BP low normal now(hypotensive during HD) - midodrine BID initiated. Follow up outpatient with PCP/Nephro as scheduled. Return to MWF HD schedule.  Discharge Diagnoses:  Active Problems:   Chronic diastolic heart failure (HCC)   Anemia of chronic kidney failure, stage 3 (moderate) (HCC)   OSA (obstructive sleep apnea)   Abdominal pain   Atherosclerosis of aorta (HCC)   Class 2 obesity due to excess calories with body mass index (BMI) of 36.0 to 36.9 in adult   Hyperkalemia    Discharge Instructions   Allergies as of 12/02/2023       Reactions   Difelikefalin Itching   Carnosine Other (See Comments)    Unknown   Gadolinium Derivatives Hives, Other (See Comments)   HIVES, Desc: HIVES W/ "DYE" USED FOR 1ST CT SCAN BUT NOT 2ND, NO PREMEDS USED, PT UNCERTAIN OF CIRCUMSTANCES,,?POSSIBLE MRI CONTRAST ALLERGY, ALL STUDIES DONE "SOMEWHERE" IN [place]//A.C., Onset Date: [id number]   Iohexol Hives, Other (See Comments)   Desc: HIVES W/ "DYE" USED FOR 1ST CT SCAN BUT NOT 2ND, NO PREMEDS USED, PT UNCERTAIN OF  CIRCUMSTANCES,,?POSSIBLE MRI CONTRAST ALLERGY, ALL STUDIES DONE "SOMEWHERE" IN [place]//A.C., Onset Date: [id number]   Iodinated Contrast Media Hives   1974   Iodine Hives   1974   Naltrexone Other (See Comments)   Unknown 1974        Medication List     TAKE these medications    acetaminophen 325 MG tablet Commonly known as: TYLENOL Take 650 mg by mouth as needed for moderate pain (pain score 4-6). What changed: Another medication with the same name was changed. Make sure you understand how and when to take each.   acetaminophen 650 MG CR tablet Commonly known as: TYLENOL Take 1 tablet (650 mg total) by mouth 3 (three) times daily as needed (arthritis). What changed:  how much to take when to take this reasons to take this   albuterol 108 (90 Base) MCG/ACT inhaler Commonly known as: VENTOLIN HFA Inhale 2 puffs into the lungs every 6 (six) hours as needed for wheezing or shortness of breath.   apixaban 5 MG Tabs tablet Commonly known as: Eliquis Take 1 tablet (5 mg total) by mouth 2 (two) times daily.   Lokelma 10 g Pack packet Generic drug: sodium zirconium cyclosilicate Take 10 g by mouth daily.   meclizine 12.5 MG tablet Commonly known as: ANTIVERT Take 1 tablet (12.5 mg total) by mouth 3 (three) times daily as needed for dizziness. What changed: when to take this   midodrine 10 MG tablet Commonly known as: PROAMATINE Take 0.5 tablets (5 mg total) by mouth 2 (two) times daily with a meal.   multivitamin Tabs tablet Take 1  tablet by mouth at bedtime.   omeprazole 20 MG capsule Commonly known as: PRILOSEC Take 1 capsule (20 mg total) by mouth daily.   sevelamer carbonate 800 MG tablet Commonly known as: RENVELA Take 2-4 tablets by mouth See admin instructions. Take 4 tablets with breakfast, lunch, and dinner. Take 2 tablets with a snack   Stiolto Respimat 2.5-2.5 MCG/ACT Aers Generic drug: Tiotropium Bromide-Olodaterol Inhale 2 puffs into the lungs  daily.   vitamin E 200 UNIT capsule Take 200 Units by mouth daily.        Allergies  Allergen Reactions   Difelikefalin Itching   Carnosine Other (See Comments)     Unknown   Gadolinium Derivatives Hives and Other (See Comments)    HIVES, Desc: HIVES W/ "DYE" USED FOR 1ST CT SCAN BUT NOT 2ND, NO PREMEDS USED, PT UNCERTAIN OF CIRCUMSTANCES,,?POSSIBLE MRI CONTRAST ALLERGY, ALL STUDIES DONE "SOMEWHERE" IN [place]//A.C., Onset Date: [id number]   Iohexol Hives and Other (See Comments)    Desc: HIVES W/ "DYE" USED FOR 1ST CT SCAN BUT NOT 2ND, NO PREMEDS USED, PT UNCERTAIN OF CIRCUMSTANCES,,?POSSIBLE MRI CONTRAST ALLERGY, ALL STUDIES DONE "SOMEWHERE" IN [place]//A.C., Onset Date: [id number]    Iodinated Contrast Media Hives    1974   Iodine Hives    1974   Naltrexone Other (See Comments)    Unknown 1974     Consultations: Nephro   Procedures/Studies: CT ABDOMEN PELVIS WO CONTRAST  Result Date: 12/01/2023 CLINICAL DATA:  Diarrhea and abdominal pain. EXAM: CT ABDOMEN AND PELVIS WITHOUT CONTRAST TECHNIQUE: Multidetector CT imaging of the abdomen and pelvis was performed following the standard protocol without IV contrast. RADIATION DOSE REDUCTION: This exam was performed according to the departmental dose-optimization program which includes automated exposure control, adjustment of the mA and/or kV according to patient size and/or use of iterative reconstruction technique. COMPARISON:  04/08/2023 FINDINGS: Lower chest: Mild linear scarring in the lung bases. Hepatobiliary: No focal liver abnormality is seen. Status post cholecystectomy. No biliary dilatation. Pancreas: Unremarkable. No pancreatic ductal dilatation or surrounding inflammatory changes. Spleen: Lobular appearance of the spleen similar to prior study, likely normal variation. No focal lesions identified. Adrenals/Urinary Tract: No adrenal gland nodules. Bilateral renal parenchymal atrophy. Bilateral renal cysts, largest  on the right measuring 3 cm diameter. No change since prior study. No imaging follow-up is indicated. No hydronephrosis or hydroureter. Bladder is decompressed. Stomach/Bowel: Stomach and small bowel are not abnormally distended. Colonic resection with ileal sigmoid anastomosis. No wall thickening or inflammatory changes. Vascular/Lymphatic: Aortic atherosclerosis. No enlarged abdominal or pelvic lymph nodes. Reproductive: Status post hysterectomy. No adnexal masses. Other: No free air or free fluid in the abdomen. Small anterior midline abdominal wall hernia containing fat, likely postoperative. No change. Musculoskeletal: No acute or significant osseous findings. IMPRESSION: No acute process demonstrated in the abdomen or pelvis. No evidence of bowel obstruction or inflammation. Aortic atherosclerosis. Electronically Signed   By: Burman Nieves M.D.   On: 12/01/2023 22:18     Subjective: No acute issues/events - diarrhea resolved. Tolerating PO well   Discharge Exam: Vitals:   12/02/23 0645 12/02/23 0700  BP: (!) 97/39 (!) 85/46  Pulse: (!) 105 100  Resp: 15 12  Temp:    SpO2: 98% 96%   Vitals:   12/02/23 0615 12/02/23 0630 12/02/23 0645 12/02/23 0700  BP: (!) 104/41 (!) 104/54 (!) 97/39 (!) 85/46  Pulse: (!) 103 97 (!) 105 100  Resp: 12 (!) 7 15 12   Temp:  TempSrc:      SpO2: 93% 97% 98% 96%  Weight:      Height:        General: Pt is alert, awake, not in acute distress Cardiovascular: RRR, S1/S2 +, no rubs, no gallops Respiratory: CTA bilaterally, no wheezing, no rhonchi Abdominal: Soft, NT, ND, bowel sounds + Extremities: no edema, no cyanosis    The results of significant diagnostics from this hospitalization (including imaging, microbiology, ancillary and laboratory) are listed below for reference.     Microbiology: Recent Results (from the past 240 hour(s))  Resp panel by RT-PCR (RSV, Flu A&B, Covid) Anterior Nasal Swab     Status: None   Collection Time:  12/01/23  1:55 PM   Specimen: Anterior Nasal Swab  Result Value Ref Range Status   SARS Coronavirus 2 by RT PCR NEGATIVE NEGATIVE Final   Influenza A by PCR NEGATIVE NEGATIVE Final   Influenza B by PCR NEGATIVE NEGATIVE Final    Comment: (NOTE) The Xpert Xpress SARS-CoV-2/FLU/RSV plus assay is intended as an aid in the diagnosis of influenza from Nasopharyngeal swab specimens and should not be used as a sole basis for treatment. Nasal washings and aspirates are unacceptable for Xpert Xpress SARS-CoV-2/FLU/RSV testing.  Fact Sheet for Patients: BloggerCourse.com  Fact Sheet for Healthcare Providers: SeriousBroker.it  This test is not yet approved or cleared by the [place] FDA and has been authorized for detection and/or diagnosis of SARS-CoV-2 by FDA under an Emergency Use Authorization (EUA). This EUA will remain in effect (meaning this test can be used) for the duration of the COVID-19 declaration under Section 564(b)(1) of the Act, 21 U.S.C. section 360bbb-3(b)(1), unless the authorization is terminated or revoked.     Resp Syncytial Virus by PCR NEGATIVE NEGATIVE Final    Comment: (NOTE) Fact Sheet for Patients: BloggerCourse.com  Fact Sheet for Healthcare Providers: SeriousBroker.it  This test is not yet approved or cleared by the [place] FDA and has been authorized for detection and/or diagnosis of SARS-CoV-2 by FDA under an Emergency Use Authorization (EUA). This EUA will remain in effect (meaning this test can be used) for the duration of the COVID-19 declaration under Section 564(b)(1) of the Act, 21 U.S.C. section 360bbb-3(b)(1), unless the authorization is terminated or revoked.  Performed at [facility], [address], [place], [place] [address]      Labs: BNP (last 3 results) No results for input(s): "BNP" in the last 8760 hours. Basic  Metabolic Panel: Recent Labs  Lab 12/01/23 1305 12/01/23 2130 12/02/23 0608  NA 130* 133* 129*  K 7.4* 6.4* 4.6  CL 98 98 93*  CO2 16* 15* 23  GLUCOSE 86 54* 103*  BUN 62* 62* 27*  CREATININE 13.38* 13.13* 7.27*  CALCIUM 9.3 8.9 8.1*  PHOS  --  7.3*  --    Liver Function Tests: Recent Labs  Lab 12/01/23 1305 12/01/23 2130  AST 30  --   ALT 35  --   ALKPHOS 96  --   BILITOT 0.8  --   PROT 8.6*  --   ALBUMIN 3.8 3.2*   Recent Labs  Lab 12/01/23 1305 12/01/23 1634  LIPASE 85* 75*   No results for input(s): "AMMONIA" in the last 168 hours. CBC: Recent Labs  Lab 12/01/23 1305 12/02/23 0608  WBC 11.9* 9.7  NEUTROABS 8.6*  --   HGB 14.1 11.5*  HCT 44.3 36.4  MCV 92.5 92.2  PLT 221 166   Cardiac Enzymes: No results  for input(s): "CKTOTAL", "CKMB", "CKMBINDEX", "TROPONINI" in the last 168 hours. BNP: Invalid input(s): "POCBNP" CBG: Recent Labs  Lab 12/01/23 1531 12/01/23 1632  GLUCAP 79 126*   D-Dimer No results for input(s): "DDIMER" in the last 72 hours. Hgb A1c No results for input(s): "HGBA1C" in the last 72 hours. Lipid Profile No results for input(s): "CHOL", "HDL", "LDLCALC", "TRIG", "CHOLHDL", "LDLDIRECT" in the last 72 hours. Thyroid function studies No results for input(s): "TSH", "T4TOTAL", "T3FREE", "THYROIDAB" in the last 72 hours.  Invalid input(s): "FREET3" Anemia work up No results for input(s): "VITAMINB12", "FOLATE", "FERRITIN", "TIBC", "IRON", "RETICCTPCT" in the last 72 hours. Urinalysis    Component Value Date/Time   COLORURINE YELLOW 03/02/2019 1416   APPEARANCEUR CLEAR 03/02/2019 1416   LABSPEC 1.010 03/02/2019 1416   PHURINE 6.0 03/02/2019 1416   GLUCOSEU 50 (A) 03/02/2019 1416   HGBUR SMALL (A) 03/02/2019 1416   BILIRUBINUR NEGATIVE 03/02/2019 1416   KETONESUR 5 (A) 03/02/2019 1416   PROTEINUR 100 (A) 03/02/2019 1416   UROBILINOGEN 0.2 10/19/2015 1532   NITRITE NEGATIVE 03/02/2019 1416   LEUKOCYTESUR NEGATIVE  03/02/2019 1416   Sepsis Labs Recent Labs  Lab 12/01/23 1305 12/02/23 0608  WBC 11.9* 9.7   Microbiology Recent Results (from the past 240 hour(s))  Resp panel by RT-PCR (RSV, Flu A&B, Covid) Anterior Nasal Swab     Status: None   Collection Time: 12/01/23  1:55 PM   Specimen: Anterior Nasal Swab  Result Value Ref Range Status   SARS Coronavirus 2 by RT PCR NEGATIVE NEGATIVE Final   Influenza A by PCR NEGATIVE NEGATIVE Final   Influenza B by PCR NEGATIVE NEGATIVE Final    Comment: (NOTE) The Xpert Xpress SARS-CoV-2/FLU/RSV plus assay is intended as an aid in the diagnosis of influenza from Nasopharyngeal swab specimens and should not be used as a sole basis for treatment. Nasal washings and aspirates are unacceptable for Xpert Xpress SARS-CoV-2/FLU/RSV testing.  Fact Sheet for Patients: BloggerCourse.com  Fact Sheet for Healthcare Providers: SeriousBroker.it  This test is not yet approved or cleared by the [place] FDA and has been authorized for detection and/or diagnosis of SARS-CoV-2 by FDA under an Emergency Use Authorization (EUA). This EUA will remain in effect (meaning this test can be used) for the duration of the COVID-19 declaration under Section 564(b)(1) of the Act, 21 U.S.C. section 360bbb-3(b)(1), unless the authorization is terminated or revoked.     Resp Syncytial Virus by PCR NEGATIVE NEGATIVE Final    Comment: (NOTE) Fact Sheet for Patients: BloggerCourse.com  Fact Sheet for Healthcare Providers: SeriousBroker.it  This test is not yet approved or cleared by the [place] FDA and has been authorized for detection and/or diagnosis of SARS-CoV-2 by FDA under an Emergency Use Authorization (EUA). This EUA will remain in effect (meaning this test can be used) for the duration of the COVID-19 declaration under Section 564(b)(1) of the Act, 21  U.S.C. section 360bbb-3(b)(1), unless the authorization is terminated or revoked.  Performed at [facility], [address] [place], [place] [address]      Time coordinating discharge: Over 30 minutes  SIGNED:   Azucena Fallen, DO [facility] 12/02/2023, 9:46 AM Pager   If 7PM-7AM, please contact night-coverage [email or web address]

## 2023-12-02 NOTE — ED Notes (Signed)
Report received from Assunta Gambles RN. Assumed care of pt at this time.

## 2023-12-02 NOTE — Progress Notes (Addendum)
TR[facility] night cross cover note:   I was notified by RN that this patient has developed some hypotension following this evening's hemodialysis session.  Blood pressure prior to hemodialysis noted to be 99/54, with post hemodialysis systolic blood pressure noted to be in the 70's. Other VS notable for afebrile; heart rates in the 1 teens, respiratory rate 14-17 and oxygen saturation 95% on room air.  She is asymptomatic at this time, including no report of chest pain.  She had undergone urgent hemodialysis this evening for 3 hours and 8 minutes for hyperkalemia after missing most recent prior hemodialysis session.  Per my chart review, there is no concern for volume overload leading up to her hemodialysis session this evening, and patient has been experiencing recent loose stool.   In this patient who is now hypotensive after hemodialysis, there is concern for an element of hypovolemia given timing of her hypotension as well as report of recent increase in GI losses.   Will order a small LR bolus of 250 cc's, and follow for ensuing trend in BP.    Update: patient refuses IVF's at this time and verbalizes her preference for watchful waiting regarding her additional BP management. I have also ordered a dose of midodrine in the meantime.     Newton Pigg, DO Hospitalist

## 2023-12-02 NOTE — ED Notes (Signed)
Informed pt she needs a second IV. Pt has limb restriction to left arm. Refused IV to right hand. States, "Not in the hand. If you do it there, I will leave this hospital. That is why I left the last time." Pt uncooperative and non compliant.

## 2023-12-02 NOTE — Discharge Planning (Signed)
[facility] Patient Discharge Orders- [facility] CLINIC: [place]  [name] name: Whitney Walker Admit/DC Dates: 12/01/2023 - 12/02/2023  Discharge Diagnoses: Hyperkalemia  Diarrhea  Aranesp: Given: no   Last Hgb: 11.5 PRBC's Given: no  ESA dose for discharge: no change IV Iron dose at discharge: No change  Heparin change: no  EDW Change: no  Bath Change: no  Access intervention/Change: no Details:  Hectorol/Calcitriol change: no  Discharge Labs: Calcium 8.1 Phosphorus 7.3 Albumin 3.2 K+ 4.6  Start ONSP if not already started  IV Antibiotics: no  Details:  On Coumadin?: no Last INR: Next INR: Managed By:   OTHER/APPTS/LAB ORDERS:    D/C Meds to be reconciled by nurse after every discharge.  Completed By: Virgina Norfolk, PA-C   Reviewed by: MD:______ RN_______

## 2023-12-02 NOTE — Progress Notes (Addendum)
PT Cancellation Note  Patient Details Name: Whitney Walker MRN: [id number] DOB: 06-06-1956   Cancelled Treatment:    Reason Eval/Treat Not Completed: (P) PT screened, no needs identified, will sign off Pt walks independently to bathroom and pt has no PT needs. PT signing off.  Jayonna Meyering B. Beverely Risen PT, DPT Acute Rehabilitation Services Please use secure chat or  Call Office [phone]   [facility] 12/02/2023, 10:19 AM

## 2023-12-03 ENCOUNTER — Telehealth: Payer: Self-pay

## 2023-12-03 DIAGNOSIS — N186 End stage renal disease: Secondary | ICD-10-CM | POA: Diagnosis not present

## 2023-12-03 DIAGNOSIS — D689 Coagulation defect, unspecified: Secondary | ICD-10-CM | POA: Diagnosis not present

## 2023-12-03 DIAGNOSIS — N2581 Secondary hyperparathyroidism of renal origin: Secondary | ICD-10-CM | POA: Diagnosis not present

## 2023-12-03 DIAGNOSIS — D509 Iron deficiency anemia, unspecified: Secondary | ICD-10-CM | POA: Diagnosis not present

## 2023-12-03 DIAGNOSIS — Z992 Dependence on renal dialysis: Secondary | ICD-10-CM | POA: Diagnosis not present

## 2023-12-03 DIAGNOSIS — E875 Hyperkalemia: Secondary | ICD-10-CM | POA: Diagnosis not present

## 2023-12-03 NOTE — Transitions of Care (Post Inpatient/ED Visit) (Signed)
12/03/2023  Name: Whitney Walker MRN: [id number] DOB: October 29, 1956  Today's TOC FU Call Status: Today's TOC FU Call Status:: Successful TOC FU Call Completed TOC FU Call Complete Date: 12/03/23 (Incoming call from pt wishing to go ahead and complete TOC call with RN as seh needs to discuss some things) [name] Name and Date of Birth confirmed.  Transition Care Management Follow-up Telephone Call Date of Discharge: 12/02/23 Discharge Facility: [facility]) Type of Discharge: Inpatient Admission Primary Inpatient Discharge Diagnosis:: "hyperkalemia" How have you been since you were released from the hospital?: Same (pt voices she is currently on HD machine-having some nausea-alerted staff-but can't take anthing right now) Any questions or concerns?: Yes Patient Questions/Concerns:: pt voices she called her pharmacy to see when her new med-Midodrine would be delivered and she was advised no script was sent in for med Patient Questions/Concerns Addressed: Other: (RN contacted [facility]-spoke / pharamacist- they were able to locate e-script during call -states issue was with spelling of middle name but resolved--will fill med and have it delivered to her-pt aware)  Items Reviewed: Did you receive and understand the discharge instructions provided?: Yes Medications obtained,verified, and reconciled?: Partial Review Completed Reason for Partial Mediation Review: pt at HD tx-unable to complete full med review Any new allergies since your discharge?: No Dietary orders reviewed?: Yes Type of Diet Ordered:: renal/low salt/heart healthy Do you have support at home?: Yes People in Home: child(ren), adult Name of Support/Comfort Primary Source: daughters assist her as needed  Medications Reviewed Today: Medications Reviewed Today     Reviewed by Charlyn Minerva, RN (Registered Nurse) on 12/03/23 at [phone]  Med List Status: <None>   Medication Order Taking? Sig  Documenting Provider Last Dose Status Informant  acetaminophen (TYLENOL) 325 MG tablet [id number] No Take 650 mg by mouth as needed for moderate pain (pain score 4-6). [provider] Past Week Active Self, Pharmacy Records           Med Note Epimenio Sarin, Carlota Raspberry Dec 01, 2023  2:32 PM) Pt has both 325 and 650 mg at home.   acetaminophen (TYLENOL) 650 MG CR tablet [id number] No Take 1 tablet (650 mg total) by mouth 3 (three) times daily as needed (arthritis).  Patient taking differently: Take 1,300 mg by mouth as needed for pain (arthritis).   Arnette Felts, FNP Past Month Active Self, Pharmacy Records           Med Note Epimenio Sarin, Carlota Raspberry Dec 01, 2023  2:32 PM) Pt has both 325 and 650 mg at home.   albuterol (VENTOLIN HFA) 108 (90 Base) MCG/ACT inhaler [id number] No Inhale 2 puffs into the lungs every 6 (six) hours as needed for wheezing or shortness of breath. Arnette Felts, FNP 11/27/2023 Active Self, Pharmacy Records  apixaban (ELIQUIS) 5 MG TABS tablet [id number] No Take 1 tablet (5 mg total) by mouth 2 (two) times daily. Arnette Felts, FNP 12/01/2023 0930 Active Self, Pharmacy Records  meclizine (ANTIVERT) 12.5 MG tablet [id number] No Take 1 tablet (12.5 mg total) by mouth 3 (three) times daily as needed for dizziness.  Patient taking differently: Take 12.5 mg by mouth as needed for dizziness.   Arnette Felts, FNP Past Month Active Self, Pharmacy Records  midodrine (PROAMATINE) 10 MG tablet [id number]  Take 0.5 tablets (5 mg total) by mouth 2 (two) times daily with a meal. Azucena Fallen, MD  Active   multivitamin (RENA-VIT) TABS tablet [id number] No Take  1 tablet by mouth at bedtime. [provider] 11/30/2023 Active Self, Pharmacy Records  omeprazole (PRILOSEC) 20 MG capsule [id number] No Take 1 capsule (20 mg total) by mouth daily. Arnette Felts, FNP 12/01/2023 Active Self, Pharmacy Records  sevelamer carbonate (RENVELA) 800 MG tablet [id number] No Take 2-4 tablets by mouth  See admin instructions. Take 4 tablets with breakfast, lunch, and dinner. Take 2 tablets with a snack [provider] 11/29/2023 Active Self, Pharmacy Records  sodium zirconium cyclosilicate (LOKELMA) 10 g PACK packet [id number] No Take 10 g by mouth daily. [provider] 11/30/2023 Active Self, Pharmacy Records  Tiotropium Bromide-Olodaterol (STIOLTO RESPIMAT) 2.5-2.5 MCG/ACT AERS [id number] No Inhale 2 puffs into the lungs daily. Hunsucker, Lesia Sago, MD 11/29/2023 Active Self, Pharmacy Records  vitamin E 200 UNIT capsule [id number] No Take 200 Units by mouth daily. [provider] 11/30/2023 Active Self, Pharmacy Records  Med List Note Sharol Given 05/30/22 1327):              Home Care and Equipment/Supplies: Were Home Health Services Ordered?: NA Any new equipment or medical supplies ordered?: NA  Functional Questionnaire: Do you need assistance with bathing/showering or dressing?: No Do you need assistance with meal preparation?: No Do you need assistance with eating?: No Do you have difficulty maintaining continence: No Do you need assistance with getting out of bed/getting out of a chair/moving?: No Do you have difficulty managing or taking your medications?: No  Follow up appointments reviewed: PCP Follow-up appointment confirmed?: No (Pt declined making PCP appt-states she just saw provider) MD Provider Line Number:[phone] Given: No Specialist Hospital Follow-up appointment confirmed?: Yes Date of Specialist follow-up appointment?: 12/26/23 Follow-Up Specialty Provider:: Dr. Jacinto Halim Do you need transportation to your follow-up appointment?: No Do you understand care options if your condition(s) worsen?: Yes-patient verbalized understanding  SDOH Interventions Today    Flowsheet Row Most Recent Value  SDOH Interventions   Food Insecurity Interventions Intervention Not Indicated  Housing Interventions Intervention Not Indicated   Transportation Interventions Patient Declined  Utilities Interventions Intervention Not Indicated      TOC interventions discussed/reviewed: -Discussed/reviewed insurance/health plans benefits -Doctor visit discussed/reviewed -PCP -Doctor visits discussed/reviewed-Specialist -Patent examiner  -Provided Verbal Education: 30-day TOC program-pt already active and engaged with longitudinal RN CM-has upcoming appt, nutrition, meds & their functions, symptom mgmt., fall/safety measures in the home   Antionette Fairy, RN,BSN,CCM RN Care Manager Transitions of Care  [place]  Direct Phone: [phone] Toll Free: [phone] Fax: [phone]

## 2023-12-03 NOTE — Transitions of Care (Post Inpatient/ED Visit) (Signed)
   12/03/2023  Name: Whitney Walker MRN: [id number] DOB: 01-15-1956  Today's TOC FU Call Status: Today's TOC FU Call Status:: Unsuccessful Call (1st Attempt) Unsuccessful Call (1st Attempt) Date: 12/03/23 (Spoke with pt who reported she was currently at HD tx-requested call back later.)  Attempted to reach the patient regarding the most recent Inpatient/ED visit.  Follow Up Plan: Additional outreach attempts will be made to reach the patient to complete the Transitions of Care (Post Inpatient/ED visit) call.    Antionette Fairy, RN,BSN,CCM RN Care Manager Transitions of Care  [place]  Direct Phone: [phone] Toll Free: [phone] Fax: [phone]

## 2023-12-05 DIAGNOSIS — D689 Coagulation defect, unspecified: Secondary | ICD-10-CM | POA: Diagnosis not present

## 2023-12-05 DIAGNOSIS — E875 Hyperkalemia: Secondary | ICD-10-CM | POA: Diagnosis not present

## 2023-12-05 DIAGNOSIS — N186 End stage renal disease: Secondary | ICD-10-CM | POA: Diagnosis not present

## 2023-12-05 DIAGNOSIS — Z992 Dependence on renal dialysis: Secondary | ICD-10-CM | POA: Diagnosis not present

## 2023-12-05 DIAGNOSIS — D509 Iron deficiency anemia, unspecified: Secondary | ICD-10-CM | POA: Diagnosis not present

## 2023-12-05 DIAGNOSIS — N2581 Secondary hyperparathyroidism of renal origin: Secondary | ICD-10-CM | POA: Diagnosis not present

## 2023-12-08 DIAGNOSIS — D689 Coagulation defect, unspecified: Secondary | ICD-10-CM | POA: Diagnosis not present

## 2023-12-08 DIAGNOSIS — N186 End stage renal disease: Secondary | ICD-10-CM | POA: Diagnosis not present

## 2023-12-08 DIAGNOSIS — D509 Iron deficiency anemia, unspecified: Secondary | ICD-10-CM | POA: Diagnosis not present

## 2023-12-08 DIAGNOSIS — Z992 Dependence on renal dialysis: Secondary | ICD-10-CM | POA: Diagnosis not present

## 2023-12-08 DIAGNOSIS — E875 Hyperkalemia: Secondary | ICD-10-CM | POA: Diagnosis not present

## 2023-12-08 DIAGNOSIS — N2581 Secondary hyperparathyroidism of renal origin: Secondary | ICD-10-CM | POA: Diagnosis not present

## 2023-12-10 DIAGNOSIS — N186 End stage renal disease: Secondary | ICD-10-CM | POA: Diagnosis not present

## 2023-12-10 DIAGNOSIS — D689 Coagulation defect, unspecified: Secondary | ICD-10-CM | POA: Diagnosis not present

## 2023-12-10 DIAGNOSIS — E875 Hyperkalemia: Secondary | ICD-10-CM | POA: Diagnosis not present

## 2023-12-10 DIAGNOSIS — Z992 Dependence on renal dialysis: Secondary | ICD-10-CM | POA: Diagnosis not present

## 2023-12-10 DIAGNOSIS — N2581 Secondary hyperparathyroidism of renal origin: Secondary | ICD-10-CM | POA: Diagnosis not present

## 2023-12-10 DIAGNOSIS — D509 Iron deficiency anemia, unspecified: Secondary | ICD-10-CM | POA: Diagnosis not present

## 2023-12-12 DIAGNOSIS — D689 Coagulation defect, unspecified: Secondary | ICD-10-CM | POA: Diagnosis not present

## 2023-12-12 DIAGNOSIS — N186 End stage renal disease: Secondary | ICD-10-CM | POA: Diagnosis not present

## 2023-12-12 DIAGNOSIS — Z992 Dependence on renal dialysis: Secondary | ICD-10-CM | POA: Diagnosis not present

## 2023-12-12 DIAGNOSIS — D509 Iron deficiency anemia, unspecified: Secondary | ICD-10-CM | POA: Diagnosis not present

## 2023-12-12 DIAGNOSIS — N2581 Secondary hyperparathyroidism of renal origin: Secondary | ICD-10-CM | POA: Diagnosis not present

## 2023-12-12 DIAGNOSIS — E875 Hyperkalemia: Secondary | ICD-10-CM | POA: Diagnosis not present

## 2023-12-15 DIAGNOSIS — N2581 Secondary hyperparathyroidism of renal origin: Secondary | ICD-10-CM | POA: Diagnosis not present

## 2023-12-15 DIAGNOSIS — E875 Hyperkalemia: Secondary | ICD-10-CM | POA: Diagnosis not present

## 2023-12-15 DIAGNOSIS — D689 Coagulation defect, unspecified: Secondary | ICD-10-CM | POA: Diagnosis not present

## 2023-12-15 DIAGNOSIS — D509 Iron deficiency anemia, unspecified: Secondary | ICD-10-CM | POA: Diagnosis not present

## 2023-12-15 DIAGNOSIS — N186 End stage renal disease: Secondary | ICD-10-CM | POA: Diagnosis not present

## 2023-12-15 DIAGNOSIS — Z992 Dependence on renal dialysis: Secondary | ICD-10-CM | POA: Diagnosis not present

## 2023-12-16 ENCOUNTER — Ambulatory Visit: Payer: Self-pay

## 2023-12-16 NOTE — Patient Outreach (Signed)
  Care Coordination   Follow Up Visit Note   12/16/2023 Name: Whitney Walker MRN: [id number] DOB: [birth year]  Whitney Walker is a 67 y.o. year old female who sees Arnette Felts, FNP for primary care. I spoke with  [name] by phone today.  What matters to the patients health and wellness today?   Patient would like to remain out of the hospital.      Goals Addressed             This Visit's Progress    COMPLETED: RN Care Coordination Activites: no further follow up needed       Care Coordination Interventions: Evaluation of current treatment plan related to ESRD w/hyperkalemia and patient's adherence to plan as established by provider Discussed with patient she experienced a recent hospital admission for hyperkalemia and diarrhea Review of patient status, including review of consultant's reports, relevant laboratory and other test results, and medications completed Determined patient's K+ level is back to baseline at 4.6 upon discharge Discussed with patient she does not plan to f/u with her PCP upon discharge Discussed and reviewed with patient next scheduled Cardiology follow up is scheduled for 12/26/23 @2 :00 PM with Dr. Jacinto Halim and patient verbalizes she plans to keep this appointment  Instructed patient to report new symptoms or concerns to her doctor promptly       Interventions Today    Flowsheet Row Most Recent Value  Chronic Disease   Chronic disease during today's visit Chronic Kidney Disease/End Stage Renal Disease (ESRD)  General Interventions   General Interventions Discussed/Reviewed General Interventions Discussed, General Interventions Reviewed, Labs, Doctor Visits  Doctor Visits Discussed/Reviewed Doctor Visits Discussed, Doctor Visits Reviewed, PCP  Education Interventions   Education Provided Provided Education  Provided Verbal Education On Labs, When to see the doctor, Nutrition  Labs Reviewed --  [Potassium]  Nutrition Interventions    Nutrition Discussed/Reviewed Nutrition Discussed, Nutrition Reviewed, Fluid intake              SDOH assessments and interventions completed:  Yes  SDOH Interventions Today    Flowsheet Row Most Recent Value  SDOH Interventions   Transportation Interventions Intervention Not Indicated  Physical Activity Interventions Intervention Not Indicated        Care Coordination Interventions:  Yes, provided   Follow up plan: Follow up call scheduled for 01/13/24 @11 :30 AM    Encounter Outcome:  Patient Visit Completed

## 2023-12-16 NOTE — Patient Instructions (Signed)
Visit Information  Thank you for taking time to visit with me today. Please don't hesitate to contact me if I can be of assistance to you.   Following are the goals we discussed today:   Goals Addressed             This Visit's Progress    COMPLETED: RN Care Coordination Activites: no further follow up needed       Care Coordination Interventions: Evaluation of current treatment plan related to ESRD w/hyperkalemia and patient's adherence to plan as established by provider Discussed with patient she experienced a recent hospital admission for hyperkalemia and diarrhea Review of patient status, including review of consultant's reports, relevant laboratory and other test results, and medications completed Determined patient's K+ level is back to baseline at 4.6 upon discharge Discussed with patient she does not plan to f/u with her PCP upon discharge Discussed and reviewed with patient next scheduled Cardiology follow up is scheduled for 12/26/23 @2 :00 PM with Dr. Jacinto Halim and patient verbalizes she plans to keep this appointment  Instructed patient to report new symptoms or concerns to her doctor promptly           Our next appointment is by telephone on 01/13/24 at 11:30 AM  Please call the care guide team at [phone] if you need to cancel or reschedule your appointment.   If you are experiencing a Mental Health or Behavioral Health Crisis or need someone to talk to, please call 1-800-273-TALK (toll free, 24 hour hotline)  Patient verbalizes understanding of instructions and care plan provided today and agrees to view in MyChart. Active MyChart status and patient understanding of how to access instructions and care plan via MyChart confirmed with patient.     Delsa Sale RN BSN CCM [place]  [facility], [name] Nurse Care Coordinator  Direct Dial: [phone] Website: [email or web address]

## 2023-12-17 ENCOUNTER — Other Ambulatory Visit: Payer: Self-pay | Admitting: Nurse Practitioner

## 2023-12-17 DIAGNOSIS — Z992 Dependence on renal dialysis: Secondary | ICD-10-CM | POA: Diagnosis not present

## 2023-12-17 DIAGNOSIS — N2581 Secondary hyperparathyroidism of renal origin: Secondary | ICD-10-CM | POA: Diagnosis not present

## 2023-12-17 DIAGNOSIS — Z0000 Encounter for general adult medical examination without abnormal findings: Secondary | ICD-10-CM

## 2023-12-17 DIAGNOSIS — E875 Hyperkalemia: Secondary | ICD-10-CM | POA: Diagnosis not present

## 2023-12-17 DIAGNOSIS — N186 End stage renal disease: Secondary | ICD-10-CM | POA: Diagnosis not present

## 2023-12-17 DIAGNOSIS — D689 Coagulation defect, unspecified: Secondary | ICD-10-CM | POA: Diagnosis not present

## 2023-12-17 DIAGNOSIS — D509 Iron deficiency anemia, unspecified: Secondary | ICD-10-CM | POA: Diagnosis not present

## 2023-12-19 DIAGNOSIS — Z992 Dependence on renal dialysis: Secondary | ICD-10-CM | POA: Diagnosis not present

## 2023-12-19 DIAGNOSIS — N2581 Secondary hyperparathyroidism of renal origin: Secondary | ICD-10-CM | POA: Diagnosis not present

## 2023-12-19 DIAGNOSIS — N186 End stage renal disease: Secondary | ICD-10-CM | POA: Diagnosis not present

## 2023-12-19 DIAGNOSIS — D509 Iron deficiency anemia, unspecified: Secondary | ICD-10-CM | POA: Diagnosis not present

## 2023-12-19 DIAGNOSIS — D689 Coagulation defect, unspecified: Secondary | ICD-10-CM | POA: Diagnosis not present

## 2023-12-19 DIAGNOSIS — E875 Hyperkalemia: Secondary | ICD-10-CM | POA: Diagnosis not present

## 2023-12-21 DIAGNOSIS — E875 Hyperkalemia: Secondary | ICD-10-CM | POA: Diagnosis not present

## 2023-12-21 DIAGNOSIS — N2581 Secondary hyperparathyroidism of renal origin: Secondary | ICD-10-CM | POA: Diagnosis not present

## 2023-12-21 DIAGNOSIS — N186 End stage renal disease: Secondary | ICD-10-CM | POA: Diagnosis not present

## 2023-12-21 DIAGNOSIS — D509 Iron deficiency anemia, unspecified: Secondary | ICD-10-CM | POA: Diagnosis not present

## 2023-12-21 DIAGNOSIS — D689 Coagulation defect, unspecified: Secondary | ICD-10-CM | POA: Diagnosis not present

## 2023-12-21 DIAGNOSIS — Z992 Dependence on renal dialysis: Secondary | ICD-10-CM | POA: Diagnosis not present

## 2023-12-23 DIAGNOSIS — D509 Iron deficiency anemia, unspecified: Secondary | ICD-10-CM | POA: Diagnosis not present

## 2023-12-23 DIAGNOSIS — E875 Hyperkalemia: Secondary | ICD-10-CM | POA: Diagnosis not present

## 2023-12-23 DIAGNOSIS — N186 End stage renal disease: Secondary | ICD-10-CM | POA: Diagnosis not present

## 2023-12-23 DIAGNOSIS — N2581 Secondary hyperparathyroidism of renal origin: Secondary | ICD-10-CM | POA: Diagnosis not present

## 2023-12-23 DIAGNOSIS — D689 Coagulation defect, unspecified: Secondary | ICD-10-CM | POA: Diagnosis not present

## 2023-12-23 DIAGNOSIS — Z992 Dependence on renal dialysis: Secondary | ICD-10-CM | POA: Diagnosis not present

## 2023-12-26 ENCOUNTER — Ambulatory Visit (INDEPENDENT_AMBULATORY_CARE_PROVIDER_SITE_OTHER): Payer: 59 | Admitting: Cardiology

## 2023-12-26 VITALS — BP 145/86 | HR 103 | Resp 16 | Ht 61.0 in | Wt 198.4 lb

## 2023-12-26 DIAGNOSIS — E875 Hyperkalemia: Secondary | ICD-10-CM | POA: Diagnosis not present

## 2023-12-26 DIAGNOSIS — I5032 Chronic diastolic (congestive) heart failure: Secondary | ICD-10-CM

## 2023-12-26 DIAGNOSIS — I951 Orthostatic hypotension: Secondary | ICD-10-CM

## 2023-12-26 DIAGNOSIS — Z992 Dependence on renal dialysis: Secondary | ICD-10-CM

## 2023-12-26 DIAGNOSIS — N186 End stage renal disease: Secondary | ICD-10-CM

## 2023-12-26 DIAGNOSIS — I10 Essential (primary) hypertension: Secondary | ICD-10-CM

## 2023-12-26 DIAGNOSIS — D689 Coagulation defect, unspecified: Secondary | ICD-10-CM | POA: Diagnosis not present

## 2023-12-26 DIAGNOSIS — I739 Peripheral vascular disease, unspecified: Secondary | ICD-10-CM

## 2023-12-26 DIAGNOSIS — N2581 Secondary hyperparathyroidism of renal origin: Secondary | ICD-10-CM | POA: Diagnosis not present

## 2023-12-26 DIAGNOSIS — D509 Iron deficiency anemia, unspecified: Secondary | ICD-10-CM | POA: Diagnosis not present

## 2023-12-26 NOTE — Progress Notes (Unsigned)
Cardiology Office Note:  .   Date:  12/26/2023  ID:  Whitney Walker, DOB 1956/08/13, MRN [id number] PCP: Arnette Felts, FNP  [facility] Providers Cardiologist:  None { Click to update primary MD,subspecialty MD or APP then REFRESH:1}  History of Present Illness: .   Whitney Walker is a 67 y.o. African-American female with morbid obesity, hypertension, hyperlipidemia, tobacco use, obstructive sleep apnea not on CPAP, COPD and severe peripheral arterial disease with history of left femoral-popliteal bypass surgery in 2014, on chronic anticoagulation with Eliquis due to recurrent DVT and PE in 2014 and 2016, ESRD on hemodialysis on Monday/Wednesday/Friday.  Since to the point of ESRD, patient has developed severe orthostatic hypotension.  Discussed the use of AI scribe software for clinical note transcription with the patient, who gave verbal consent to proceed.  History of Present Illness            ROS  Labs   Lab Results  Component Value Date   CHOL 171 07/29/2023   HDL 48 07/29/2023   LDLCALC 86 07/29/2023   TRIG 223 (H) 07/29/2023   CHOLHDL 3.6 07/29/2023   Lab Results  Component Value Date   NA 129 (L) 12/02/2023   K 4.6 12/02/2023   CO2 23 12/02/2023   GLUCOSE 103 (H) 12/02/2023   BUN 27 (H) 12/02/2023   CREATININE 7.27 (H) 12/02/2023   CALCIUM 8.1 (L) 12/02/2023   EGFR 5 (L) 05/30/2022   GFRNONAA 6 (L) 12/02/2023      Latest Ref Rng & Units 12/02/2023    6:08 AM 12/01/2023    9:30 PM 12/01/2023    1:05 PM  BMP  Glucose 70 - 99 mg/dL 440  54  86   BUN 8 - 23 mg/dL 27  62  62   Creatinine 0.44 - 1.00 mg/dL 1.02  72.53  66.44   Sodium 135 - 145 mmol/L 129  133  130   Potassium 3.5 - 5.1 mmol/L 4.6  6.4  7.4   Chloride 98 - 111 mmol/L 93  98  98   CO2 22 - 32 mmol/L 23  15  16    Calcium 8.9 - 10.3 mg/dL 8.1  8.9  9.3       Latest Ref Rng & Units 12/02/2023    6:08 AM 12/01/2023    1:05 PM 05/22/2023   11:27 AM  CBC  WBC 4.0 - 10.5 K/uL  9.7  11.9  10.4   Hemoglobin 12.0 - 15.0 g/dL 03.4  74.2  59.5   Hematocrit 36.0 - 46.0 % 36.4  44.3  39.7   Platelets 150 - 400 K/uL 166  221  175    External Labs:  ***  Physical Exam:   VS:  There were no vitals taken for this visit.   Wt Readings from Last 3 Encounters:  12/01/23 198 lb 13.7 oz (90.2 kg)  11/25/23 198 lb 12.8 oz (90.2 kg)  09/09/23 200 lb 9.6 oz (91 kg)     Physical Exam  Studies Reviewed: [name]    Left Heart Catheterization 05/02/22:  RCA: Dominant.  Mild diffuse disease. Left main: Large vessel, very short and immediately bifurcates.  Calcified. LCx: Large vessel, [place] to a very high large OM1, a moderate-sized OM 2 distally, mild to moderate diffuse disease is present.  No high-grade stenosis. LAD: Large vessel, gives coronary to a very large D1, mild diffuse disease again evident without any focal high-grade stenosis.  Diffuse coronary calcification is again evident.  LAD is tortuous in the mid to distal segment suggestive of hypertensive heart disease.   Recommendation: [name] positive cardiac markers probably related to very small vessel disease.   Echocardiogram 05/18/2022:   1. Left ventricular ejection fraction, by estimation, is 70 to 75%. The left ventricle has hyperdynamic function. The left ventricle has no regional wall motion abnormalities. There is moderate left ventricular hypertrophy. Left ventricular diastolic  parameters are consistent with Grade I diastolic dysfunction (impaired relaxation).  2. Right ventricular systolic function is normal. The right ventricular size is normal.  3. No vegetation seen. The mitral valve is abnormal. No evidence of mitral valve regurgitation. No evidence of mitral stenosis. Moderate mitral annular calcification. No vegetation seen.  4. Increased transvalvular gradient (max 42 mmHg) likely a combination of LVOT obstruction and possibly mild aortic valvular stenosis.     No vegetation seen.. The aortic valve  is tricuspid. There is mild calcification of the aortic valve. Aortic valve regurgitation is not visualized. No vegetation seen  5. No signifcaitn change compared to outpatient study in 03/2022.  Carotid artery duplex 07/17/2023: Duplex suggests stenosis in the right internal carotid artery (minimal). Duplex suggests stenosis in the left internal carotid artery (minimal). Antegrade right vertebral artery flow. Antegrade left vertebral artery flow.  Lung cancer screening 08/19/2023: Aortic atherosclerosis, left main and three-vessel coronary artery disease, calcification of the aortic valve and mitral annulus.  Dilatation of the pulmonary trunk at 3.9 cm. Mild centrilobular and paraseptal emphysema. Nodular contour of the liver suggestive of underlying cirrhosis.  EKG:         *** Medications and allergies    Allergies  Allergen Reactions   Difelikefalin Itching   Carnosine Other (See Comments)     Unknown   Gadolinium Derivatives Hives and Other (See Comments)    HIVES, Desc: HIVES W/ "DYE" USED FOR 1ST CT SCAN BUT NOT 2ND, NO PREMEDS USED, PT UNCERTAIN OF CIRCUMSTANCES,,?POSSIBLE MRI CONTRAST ALLERGY, ALL STUDIES DONE "SOMEWHERE" IN [place]//A.C., Onset Date: 16109604   Iohexol Hives and Other (See Comments)    Desc: HIVES W/ "DYE" USED FOR 1ST CT SCAN BUT NOT 2ND, NO PREMEDS USED, PT UNCERTAIN OF CIRCUMSTANCES,,?POSSIBLE MRI CONTRAST ALLERGY, ALL STUDIES DONE "SOMEWHERE" IN [place]//A.C., Onset Date: 54098119    Iodinated Contrast Media Hives    1974   Iodine Hives    1974   Naltrexone Other (See Comments)    Unknown 1974      Current Outpatient Medications:    acetaminophen (TYLENOL) 325 MG tablet, Take 650 mg by mouth as needed for moderate pain (pain score 4-6)., Disp: , Rfl:    acetaminophen (TYLENOL) 650 MG CR tablet, Take 1 tablet (650 mg total) by mouth 3 (three) times daily as needed (arthritis). (Patient taking differently: Take 1,300 mg by mouth as  needed for pain (arthritis).), Disp: 90 tablet, Rfl: 1   albuterol (VENTOLIN HFA) 108 (90 Base) MCG/ACT inhaler, Inhale 2 puffs into the lungs every 6 (six) hours as needed for wheezing or shortness of breath., Disp: 18 g, Rfl: 2   apixaban (ELIQUIS) 5 MG TABS tablet, Take 1 tablet (5 mg total) by mouth 2 (two) times daily., Disp: 60 tablet, Rfl: 2   meclizine (ANTIVERT) 12.5 MG tablet, Take 1 tablet (12.5 mg total) by mouth 3 (three) times daily as needed for dizziness. (Patient taking differently: Take 12.5 mg by mouth as needed for dizziness.), Disp: 90 tablet, Rfl: 1   midodrine (PROAMATINE) 10 MG tablet, Take 0.5 tablets (5 mg  total) by mouth 2 (two) times daily with a meal., Disp: 60 tablet, Rfl: 0   multivitamin (RENA-VIT) TABS tablet, Take 1 tablet by mouth at bedtime., Disp: , Rfl:    omeprazole (PRILOSEC) 20 MG capsule, Take 1 capsule (20 mg total) by mouth daily., Disp: 90 capsule, Rfl: 1   sevelamer carbonate (RENVELA) 800 MG tablet, Take 2-4 tablets by mouth See admin instructions. Take 4 tablets with breakfast, lunch, and dinner. Take 2 tablets with a snack, Disp: , Rfl:    sodium zirconium cyclosilicate (LOKELMA) 10 g PACK packet, Take 10 g by mouth daily., Disp: , Rfl:    Tiotropium Bromide-Olodaterol (STIOLTO RESPIMAT) 2.5-2.5 MCG/ACT AERS, Inhale 2 puffs into the lungs daily., Disp: 4 g, Rfl: 11   vitamin E 200 UNIT capsule, Take 200 Units by mouth daily., Disp: , Rfl:    ASSESSMENT AND PLAN: .      ICD-10-CM   1. Chronic diastolic heart failure (HCC)  Z61.09     2. PVD (peripheral vascular disease) with claudication (HCC)  I73.9     3. Orthostatic hypotension  I95.1     4. Supine hypertension  I10     5. ESRD on dialysis (HCC)  N18.6    Z99.2       1. Chronic diastolic heart failure (HCC) ***  2. PVD (peripheral vascular disease) with claudication (HCC) ***  3. Orthostatic hypotension ***  4. Supine hypertension ***  5. ESRD on dialysis  (HCC) ***  Assessment and Plan                {Are you ordering a CV Procedure (e.g. stress test, cath, DCCV, TEE, etc)?   Press F2        :[id number]}   Signed,  Yates Decamp, MD, [facility] 12/26/2023, 1:02 PM [facility] [address] [place], [place] [address] Phone: [phone]. Fax:  [phone]

## 2023-12-28 DIAGNOSIS — N186 End stage renal disease: Secondary | ICD-10-CM | POA: Diagnosis not present

## 2023-12-28 DIAGNOSIS — N2581 Secondary hyperparathyroidism of renal origin: Secondary | ICD-10-CM | POA: Diagnosis not present

## 2023-12-28 DIAGNOSIS — D689 Coagulation defect, unspecified: Secondary | ICD-10-CM | POA: Diagnosis not present

## 2023-12-28 DIAGNOSIS — D509 Iron deficiency anemia, unspecified: Secondary | ICD-10-CM | POA: Diagnosis not present

## 2023-12-28 DIAGNOSIS — E875 Hyperkalemia: Secondary | ICD-10-CM | POA: Diagnosis not present

## 2023-12-28 DIAGNOSIS — Z992 Dependence on renal dialysis: Secondary | ICD-10-CM | POA: Diagnosis not present

## 2023-12-30 DIAGNOSIS — Z992 Dependence on renal dialysis: Secondary | ICD-10-CM | POA: Diagnosis not present

## 2023-12-30 DIAGNOSIS — E875 Hyperkalemia: Secondary | ICD-10-CM | POA: Diagnosis not present

## 2023-12-30 DIAGNOSIS — D509 Iron deficiency anemia, unspecified: Secondary | ICD-10-CM | POA: Diagnosis not present

## 2023-12-30 DIAGNOSIS — D689 Coagulation defect, unspecified: Secondary | ICD-10-CM | POA: Diagnosis not present

## 2023-12-30 DIAGNOSIS — N186 End stage renal disease: Secondary | ICD-10-CM | POA: Diagnosis not present

## 2023-12-30 DIAGNOSIS — I129 Hypertensive chronic kidney disease with stage 1 through stage 4 chronic kidney disease, or unspecified chronic kidney disease: Secondary | ICD-10-CM | POA: Diagnosis not present

## 2023-12-30 DIAGNOSIS — N2581 Secondary hyperparathyroidism of renal origin: Secondary | ICD-10-CM | POA: Diagnosis not present

## 2024-01-01 ENCOUNTER — Other Ambulatory Visit: Payer: Self-pay | Admitting: Nurse Practitioner

## 2024-01-02 DIAGNOSIS — N186 End stage renal disease: Secondary | ICD-10-CM | POA: Diagnosis not present

## 2024-01-02 DIAGNOSIS — N2581 Secondary hyperparathyroidism of renal origin: Secondary | ICD-10-CM | POA: Diagnosis not present

## 2024-01-02 DIAGNOSIS — Z992 Dependence on renal dialysis: Secondary | ICD-10-CM | POA: Diagnosis not present

## 2024-01-02 DIAGNOSIS — E875 Hyperkalemia: Secondary | ICD-10-CM | POA: Diagnosis not present

## 2024-01-02 DIAGNOSIS — D689 Coagulation defect, unspecified: Secondary | ICD-10-CM | POA: Diagnosis not present

## 2024-01-02 DIAGNOSIS — D509 Iron deficiency anemia, unspecified: Secondary | ICD-10-CM | POA: Diagnosis not present

## 2024-01-05 DIAGNOSIS — Z992 Dependence on renal dialysis: Secondary | ICD-10-CM | POA: Diagnosis not present

## 2024-01-05 DIAGNOSIS — E875 Hyperkalemia: Secondary | ICD-10-CM | POA: Diagnosis not present

## 2024-01-05 DIAGNOSIS — N186 End stage renal disease: Secondary | ICD-10-CM | POA: Diagnosis not present

## 2024-01-05 DIAGNOSIS — D689 Coagulation defect, unspecified: Secondary | ICD-10-CM | POA: Diagnosis not present

## 2024-01-05 DIAGNOSIS — N2581 Secondary hyperparathyroidism of renal origin: Secondary | ICD-10-CM | POA: Diagnosis not present

## 2024-01-05 DIAGNOSIS — D509 Iron deficiency anemia, unspecified: Secondary | ICD-10-CM | POA: Diagnosis not present

## 2024-01-07 DIAGNOSIS — D509 Iron deficiency anemia, unspecified: Secondary | ICD-10-CM | POA: Diagnosis not present

## 2024-01-07 DIAGNOSIS — N186 End stage renal disease: Secondary | ICD-10-CM | POA: Diagnosis not present

## 2024-01-07 DIAGNOSIS — E875 Hyperkalemia: Secondary | ICD-10-CM | POA: Diagnosis not present

## 2024-01-07 DIAGNOSIS — D689 Coagulation defect, unspecified: Secondary | ICD-10-CM | POA: Diagnosis not present

## 2024-01-07 DIAGNOSIS — Z992 Dependence on renal dialysis: Secondary | ICD-10-CM | POA: Diagnosis not present

## 2024-01-07 DIAGNOSIS — N2581 Secondary hyperparathyroidism of renal origin: Secondary | ICD-10-CM | POA: Diagnosis not present

## 2024-01-09 DIAGNOSIS — N186 End stage renal disease: Secondary | ICD-10-CM | POA: Diagnosis not present

## 2024-01-09 DIAGNOSIS — Z992 Dependence on renal dialysis: Secondary | ICD-10-CM | POA: Diagnosis not present

## 2024-01-09 DIAGNOSIS — D509 Iron deficiency anemia, unspecified: Secondary | ICD-10-CM | POA: Diagnosis not present

## 2024-01-09 DIAGNOSIS — E875 Hyperkalemia: Secondary | ICD-10-CM | POA: Diagnosis not present

## 2024-01-09 DIAGNOSIS — D689 Coagulation defect, unspecified: Secondary | ICD-10-CM | POA: Diagnosis not present

## 2024-01-09 DIAGNOSIS — N2581 Secondary hyperparathyroidism of renal origin: Secondary | ICD-10-CM | POA: Diagnosis not present

## 2024-01-12 DIAGNOSIS — N186 End stage renal disease: Secondary | ICD-10-CM | POA: Diagnosis not present

## 2024-01-12 DIAGNOSIS — E875 Hyperkalemia: Secondary | ICD-10-CM | POA: Diagnosis not present

## 2024-01-12 DIAGNOSIS — N2581 Secondary hyperparathyroidism of renal origin: Secondary | ICD-10-CM | POA: Diagnosis not present

## 2024-01-12 DIAGNOSIS — D509 Iron deficiency anemia, unspecified: Secondary | ICD-10-CM | POA: Diagnosis not present

## 2024-01-12 DIAGNOSIS — Z992 Dependence on renal dialysis: Secondary | ICD-10-CM | POA: Diagnosis not present

## 2024-01-12 DIAGNOSIS — D689 Coagulation defect, unspecified: Secondary | ICD-10-CM | POA: Diagnosis not present

## 2024-01-13 ENCOUNTER — Ambulatory Visit: Payer: Self-pay

## 2024-01-13 NOTE — Patient Instructions (Signed)
 Visit Information  Thank you for taking time to visit with me today. Please don't hesitate to contact me if I can be of assistance to you.   Following are the goals we discussed today:   Goals Addressed               This Visit's Progress     Patient Stated     I will consider seeing a Pulmonologist for my COPD (pt-stated)   On track     Care Coordination Interventions: Evaluation of current treatment plan related to COPD and patient's adherence to plan as established by provider Provided education about and advised patient to utilize infection prevention Reviewed and discussed with patient, her next scheduled upcoming Pulmonologist visit with Dr. Annella scheduled for 03/11/24 @3 :15 PM        To understand why my Atorvastatin  was increased (pt-stated)   On track     Care Coordination Interventions: Evaluation of current treatment plan related to aortic atherosclerosis and patient's adherence to plan as established by provider Reviewed and discussed with patient her recent follow up with Dr. Ladona, Cardiologist was rescheduled due to patient had to leave the office before being seen due to her scheduled uber ride came before she could be seen by the doctor Determined patient has been experiencing some intermittent chest pain but she is unsure if this pain is coming from her dialysis catheter, she will report symptoms to Dr. Ladona at next scheduled visit on 01/20/24 @12 :00 PM Instructed patient to call 911 if symptoms persist or worsen and she verbalizes understanding         Our next appointment is by telephone on 02/19/24 at 11:00 AM  Please call the care guide team at [phone] if you need to cancel or reschedule your appointment.   If you are experiencing a Mental Health or Behavioral Health Crisis or need someone to talk to, please call 1-800-273-TALK (toll free, 24 hour hotline)  Patient verbalizes understanding of instructions and care plan provided today and agrees to  view in MyChart. Active MyChart status and patient understanding of how to access instructions and care plan via MyChart confirmed with patient.     [name] RN BSN CCM [place]  [facility], [facility] Nurse Care Coordinator  Direct Dial : [phone] Website: [email or web address]

## 2024-01-13 NOTE — Patient Outreach (Signed)
  Care Coordination   Follow Up Visit Note   01/13/2024 Name: Whitney Walker MRN: [id number] DOB: 13-Oct-1956  Whitney Walker is a 68 y.o. year old female who sees Georgina Speaks, FNP for primary care. I spoke with  Whitney Walker by phone today.  What matters to the patients health and wellness today?  Patient would like to continue to manage her chronic health conditions. She would like to f/u with her Cardiologist to discuss having intermittent chest pain.     Goals Addressed               This Visit's Progress     Patient Stated     I will consider seeing a Pulmonologist for my COPD (pt-stated)   On track     Care Coordination Interventions: Evaluation of current treatment plan related to COPD and patient's adherence to plan as established by provider Provided education about and advised patient to utilize infection prevention Reviewed and discussed with patient, her next scheduled upcoming Pulmonologist visit with Dr. Annella scheduled for 03/11/24 @3 :15 PM        To understand why my Atorvastatin  was increased (pt-stated)   On track     Care Coordination Interventions: Evaluation of current treatment plan related to aortic atherosclerosis and patient's adherence to plan as established by provider Reviewed and discussed with patient her recent follow up with Dr. Ladona, Cardiologist was rescheduled due to patient had to leave the office before being seen due to her scheduled uber ride came before she could be seen by the doctor Determined patient has been experiencing some intermittent chest pain but she is unsure if this pain is coming from her dialysis catheter, she will report symptoms to Dr. Ladona at next scheduled visit on 01/20/24 @12 :00 PM Instructed patient to call 911 if symptoms persist or worsen and she verbalizes understanding     Interventions Today    Flowsheet Row Most Recent Value  Chronic Disease   Chronic disease during today's visit Chronic  Kidney Disease/End Stage Renal Disease (ESRD), Other, Chronic Obstructive Pulmonary Disease (COPD)  [CAD, Aortic Atherosclerosis]  General Interventions   General Interventions Discussed/Reviewed General Interventions Discussed, General Interventions Reviewed, Doctor Visits  Doctor Visits Discussed/Reviewed Doctor Visits Discussed, Doctor Visits Reviewed, Specialist  Education Interventions   Education Provided Provided Education  Provided Verbal Education On When to see the doctor, Nutrition  Nutrition Interventions   Nutrition Discussed/Reviewed Nutrition Discussed, Nutrition Reviewed, Fluid intake          SDOH assessments and interventions completed:  No     Care Coordination Interventions:  Yes, provided   Follow up plan: Follow up call scheduled for 02/19/24 @11 :00 AM    Encounter Outcome:  Patient Visit Completed

## 2024-01-14 DIAGNOSIS — D689 Coagulation defect, unspecified: Secondary | ICD-10-CM | POA: Diagnosis not present

## 2024-01-14 DIAGNOSIS — Z992 Dependence on renal dialysis: Secondary | ICD-10-CM | POA: Diagnosis not present

## 2024-01-14 DIAGNOSIS — D509 Iron deficiency anemia, unspecified: Secondary | ICD-10-CM | POA: Diagnosis not present

## 2024-01-14 DIAGNOSIS — N2581 Secondary hyperparathyroidism of renal origin: Secondary | ICD-10-CM | POA: Diagnosis not present

## 2024-01-14 DIAGNOSIS — E875 Hyperkalemia: Secondary | ICD-10-CM | POA: Diagnosis not present

## 2024-01-14 DIAGNOSIS — N186 End stage renal disease: Secondary | ICD-10-CM | POA: Diagnosis not present

## 2024-01-16 DIAGNOSIS — N2581 Secondary hyperparathyroidism of renal origin: Secondary | ICD-10-CM | POA: Diagnosis not present

## 2024-01-16 DIAGNOSIS — E875 Hyperkalemia: Secondary | ICD-10-CM | POA: Diagnosis not present

## 2024-01-16 DIAGNOSIS — D689 Coagulation defect, unspecified: Secondary | ICD-10-CM | POA: Diagnosis not present

## 2024-01-16 DIAGNOSIS — Z992 Dependence on renal dialysis: Secondary | ICD-10-CM | POA: Diagnosis not present

## 2024-01-16 DIAGNOSIS — D509 Iron deficiency anemia, unspecified: Secondary | ICD-10-CM | POA: Diagnosis not present

## 2024-01-16 DIAGNOSIS — N186 End stage renal disease: Secondary | ICD-10-CM | POA: Diagnosis not present

## 2024-01-19 DIAGNOSIS — D509 Iron deficiency anemia, unspecified: Secondary | ICD-10-CM | POA: Diagnosis not present

## 2024-01-19 DIAGNOSIS — E875 Hyperkalemia: Secondary | ICD-10-CM | POA: Diagnosis not present

## 2024-01-19 DIAGNOSIS — N186 End stage renal disease: Secondary | ICD-10-CM | POA: Diagnosis not present

## 2024-01-19 DIAGNOSIS — Z992 Dependence on renal dialysis: Secondary | ICD-10-CM | POA: Diagnosis not present

## 2024-01-19 DIAGNOSIS — N2581 Secondary hyperparathyroidism of renal origin: Secondary | ICD-10-CM | POA: Diagnosis not present

## 2024-01-19 DIAGNOSIS — D689 Coagulation defect, unspecified: Secondary | ICD-10-CM | POA: Diagnosis not present

## 2024-01-20 ENCOUNTER — Ambulatory Visit: Payer: 59 | Attending: Cardiology | Admitting: Cardiology

## 2024-01-20 ENCOUNTER — Encounter: Payer: Self-pay | Admitting: Cardiology

## 2024-01-20 VITALS — BP 132/72 | HR 103 | Resp 16 | Ht 61.0 in | Wt 200.2 lb

## 2024-01-20 DIAGNOSIS — R0789 Other chest pain: Secondary | ICD-10-CM

## 2024-01-20 DIAGNOSIS — I5032 Chronic diastolic (congestive) heart failure: Secondary | ICD-10-CM

## 2024-01-20 DIAGNOSIS — I10 Essential (primary) hypertension: Secondary | ICD-10-CM

## 2024-01-20 DIAGNOSIS — I739 Peripheral vascular disease, unspecified: Secondary | ICD-10-CM | POA: Diagnosis not present

## 2024-01-20 DIAGNOSIS — I951 Orthostatic hypotension: Secondary | ICD-10-CM

## 2024-01-20 MED ORDER — ATORVASTATIN CALCIUM 20 MG PO TABS: 20.0000 mg | ORAL_TABLET | Freq: Every day | ORAL | 6 refills | Status: DC

## 2024-01-20 NOTE — Progress Notes (Signed)
Cardiology Office Note:  .   Date:  01/20/2024  ID:  Whitney Walker, DOB 1956-10-25, MRN [id number] PCP: Arnette Felts, FNP  [facility] Providers Cardiologist:  Yates Decamp, MD   History of Present Illness: .   Whitney Walker is a 68 y.o. African-American female with morbid obesity, hypertension, hyperlipidemia, tobacco use, obstructive sleep apnea not on CPAP, COPD and severe peripheral arterial disease with history of left femoral-popliteal bypass surgery in 2014, on chronic anticoagulation with Eliquis due to recurrent DVT and PE in 2014 and 2016, ESRD on hemodialysis on Monday/Wednesday/Friday.  Since to the point of ESRD, patient has developed severe orthostatic hypotension.  Discussed the use of AI scribe software for clinical note transcription with the patient, who gave verbal consent to proceed.  History of Present Illness   The patient, with a history of chronic diastolic heart failure, coronary calcification, hypertension, and orthostatic hypotension, presents with recent chest pain and arm pain. The chest pain is described as a sharp pain in the left breast area, lasting for a couple of days. The patient also reports pain in the arm. The patient has been on dialysis without any issues, but reports fluctuations in blood pressure during dialysis sessions. The patient has recently quit smoking and has been using a nicotine-free alternative for about two and a half months. The patient also reports stress related to her son's recent hospitalization for congestive heart failure.      Review of Systems  Cardiovascular:  Negative for chest pain, claudication, dyspnea on exertion and leg swelling.   Labs   Lab Results  Component Value Date   CHOL 171 07/29/2023   HDL 48 07/29/2023   LDLCALC 86 07/29/2023   TRIG 223 (H) 07/29/2023   CHOLHDL 3.6 07/29/2023   Lab Results  Component Value Date   NA 129 (L) 12/02/2023   K 4.6 12/02/2023   CO2 23 12/02/2023   GLUCOSE  103 (H) 12/02/2023   BUN 27 (H) 12/02/2023   CREATININE 7.27 (H) 12/02/2023   CALCIUM 8.1 (L) 12/02/2023   EGFR 5 (L) 05/30/2022   GFRNONAA 6 (L) 12/02/2023      Latest Ref Rng & Units 12/02/2023    6:08 AM 12/01/2023    9:30 PM 12/01/2023    1:05 PM  BMP  Glucose 70 - 99 mg/dL 782  54  86   BUN 8 - 23 mg/dL 27  62  62   Creatinine 0.44 - 1.00 mg/dL 9.56  21.30  86.57   Sodium 135 - 145 mmol/L 129  133  130   Potassium 3.5 - 5.1 mmol/L 4.6  6.4  7.4   Chloride 98 - 111 mmol/L 93  98  98   CO2 22 - 32 mmol/L 23  15  16    Calcium 8.9 - 10.3 mg/dL 8.1  8.9  9.3       Latest Ref Rng & Units 12/02/2023    6:08 AM 12/01/2023    1:05 PM 05/22/2023   11:27 AM  CBC  WBC 4.0 - 10.5 K/uL 9.7  11.9  10.4   Hemoglobin 12.0 - 15.0 g/dL 84.6  96.2  95.2   Hematocrit 36.0 - 46.0 % 36.4  44.3  39.7   Platelets 150 - 400 K/uL 166  221  175    Physical Exam:   VS:  BP 132/72 (BP Location: Right Arm, Patient Position: Sitting, Cuff Size: Large)   Pulse (!) 103   Resp 16  Ht 5\' 1"  (1.549 m)   Wt 200 lb 3.2 oz (90.8 kg)   SpO2 97%   BMI 37.83 kg/m    Wt Readings from Last 3 Encounters:  01/20/24 200 lb 3.2 oz (90.8 kg)  12/26/23 198 lb 6.4 oz (90 kg)  12/01/23 198 lb 13.7 oz (90.2 kg)    Orthostatic Vitals for the past 48 hrs (Last 6 readings):  Patient Position BP Pulse BP Location Cuff Size  01/20/24 1105 Sitting 132/72 (!) 103 Right Arm Large   Physical Exam Neck:     Vascular: No carotid bruit or JVD.  Cardiovascular:     Rate and Rhythm: Normal rate and regular rhythm.     Pulses: Intact distal pulses.          Dorsalis pedis pulses are 0 on the right side and 0 on the left side.       Posterior tibial pulses are 0 on the right side and 0 on the left side.     Heart sounds: Normal heart sounds. No murmur heard.    No gallop.  Pulmonary:     Effort: Pulmonary effort is normal.     Breath sounds: Normal breath sounds.  Abdominal:     General: Bowel sounds are normal.      Palpations: Abdomen is soft.  Musculoskeletal:     Right lower leg: No edema.     Left lower leg: No edema.  Skin:    Capillary Refill: Capillary refill takes less than 2 seconds.     Studies Reviewed: [name]    Left Heart Catheterization 05/02/22:  RCA: Dominant.  Mild diffuse disease. Left main: Large vessel, very short and immediately bifurcates.  Calcified. LCx: Large vessel, [place] to a very high large OM1, a moderate-sized OM 2 distally, mild to moderate diffuse disease is present.  No high-grade stenosis. LAD: Large vessel, gives coronary to a very large D1, mild diffuse disease again evident without any focal high-grade stenosis.  Diffuse coronary calcification is again evident.  LAD is tortuous in the mid to distal segment suggestive of hypertensive heart disease.   Recommendation: [name] positive cardiac markers probably related to very small vessel disease.   Echocardiogram 05/18/2022:   1. Left ventricular ejection fraction, by estimation, is 70 to 75%. The left ventricle has hyperdynamic function. The left ventricle has no regional wall motion abnormalities. There is moderate left ventricular hypertrophy. Left ventricular diastolic  parameters are consistent with Grade I diastolic dysfunction (impaired relaxation).  2. Right ventricular systolic function is normal. The right ventricular size is normal.  3. No vegetation seen. The mitral valve is abnormal. No evidence of mitral valve regurgitation. No evidence of mitral stenosis. Moderate mitral annular calcification. No vegetation seen.  4. Increased transvalvular gradient (max 42 mmHg) likely a combination of LVOT obstruction and possibly mild aortic valvular stenosis.     No vegetation seen.. The aortic valve is tricuspid. There is mild calcification of the aortic valve. Aortic valve regurgitation is not visualized. No vegetation seen  5. No signifcaitn change compared to outpatient study in 03/2022.  Carotid artery duplex  07/17/2023: Duplex suggests stenosis in the right internal carotid artery (minimal). Duplex suggests stenosis in the left internal carotid artery (minimal). Antegrade right vertebral artery flow. Antegrade left vertebral artery flow.  Lung cancer screening 08/19/2023: Aortic atherosclerosis, left main and three-vessel coronary artery disease, calcification of the aortic valve and mitral annulus.  Dilatation of the pulmonary trunk at 3.9 cm. Mild centrilobular and paraseptal  emphysema. Nodular contour of the liver suggestive of underlying cirrhosis.  EKG:        EKG 12/01/2023: Normal sinus rhythm at rate of 94 bpm, left anterior fascicular block.  Incomplete right bundle branch block.  Poor R progression, cannot exclude anteroseptal infarct old.  Nonspecific T abnormality.  EKG 02/18/2023: Normal sinus rhythm at rate of 97 bpm, left atrial enlargement, leftward axis. Incomplete right bundle branch block. Poor R progression, cannot exclude anteroseptal infarct old.   Medications and allergies    Allergies  Allergen Reactions   Difelikefalin Itching   Carnosine Other (See Comments)     Unknown   Gadolinium Derivatives Hives and Other (See Comments)    HIVES, Desc: HIVES W/ "DYE" USED FOR 1ST CT SCAN BUT NOT 2ND, NO PREMEDS USED, PT UNCERTAIN OF CIRCUMSTANCES,,?POSSIBLE MRI CONTRAST ALLERGY, ALL STUDIES DONE "SOMEWHERE" IN [place]//A.C., Onset Date: 44010272   Iohexol Hives and Other (See Comments)    Desc: HIVES W/ "DYE" USED FOR 1ST CT SCAN BUT NOT 2ND, NO PREMEDS USED, PT UNCERTAIN OF CIRCUMSTANCES,,?POSSIBLE MRI CONTRAST ALLERGY, ALL STUDIES DONE "SOMEWHERE" IN [place]//A.C., Onset Date: 53664403    Iodinated Contrast Media Hives    1974   Iodine Hives    1974   Naltrexone Other (See Comments)    Unknown 1974      Current Outpatient Medications:    acetaminophen (TYLENOL) 650 MG CR tablet, Take 650 mg by mouth every 8 (eight) hours as needed for pain. Arthritis  relief, Disp: , Rfl:    albuterol (VENTOLIN HFA) 108 (90 Base) MCG/ACT inhaler, Inhale 2 puffs into the lungs every 6 (six) hours as needed for wheezing or shortness of breath., Disp: 18 g, Rfl: 2   atorvastatin (LIPITOR) 20 MG tablet, Take 1 tablet (20 mg total) by mouth daily., Disp: 30 tablet, Rfl: 6   ELIQUIS 5 MG TABS tablet, TAKE 1 TABLET (5 MG TOTAL) BY MOUTH 2 (TWO) TIMES DAILY., Disp: 60 tablet, Rfl: 2   meclizine (ANTIVERT) 12.5 MG tablet, Take 1 tablet (12.5 mg total) by mouth 3 (three) times daily as needed for dizziness. (Patient taking differently: Take 12.5 mg by mouth as needed for dizziness.), Disp: 90 tablet, Rfl: 1   midodrine (PROAMATINE) 10 MG tablet, Take 0.5 tablets (5 mg total) by mouth 2 (two) times daily with a meal., Disp: 60 tablet, Rfl: 0   multivitamin (RENA-VIT) TABS tablet, Take 1 tablet by mouth at bedtime., Disp: , Rfl:    omeprazole (PRILOSEC) 20 MG capsule, Take 1 capsule (20 mg total) by mouth daily., Disp: 90 capsule, Rfl: 1   sevelamer carbonate (RENVELA) 800 MG tablet, Take 2-4 tablets by mouth See admin instructions. Take 4 tablets with breakfast, lunch, and dinner. Take 2 tablets with a snack, Disp: , Rfl:    sodium zirconium cyclosilicate (LOKELMA) 10 g PACK packet, Take 10 g by mouth daily., Disp: , Rfl:    Tiotropium Bromide-Olodaterol (STIOLTO RESPIMAT) 2.5-2.5 MCG/ACT AERS, Inhale 2 puffs into the lungs daily., Disp: 4 g, Rfl: 11   vitamin E 200 UNIT capsule, Take 200 Units by mouth daily., Disp: , Rfl:    ASSESSMENT AND PLAN: .      ICD-10-CM   1. Chronic diastolic heart failure (HCC)  K74.25     2. PVD (peripheral vascular disease) with claudication (HCC)  I73.9 atorvastatin (LIPITOR) 20 MG tablet    3. Orthostatic hypotension  I95.1     4. Supine hypertension  I10     5. Musculoskeletal  chest pain  R07.89      1. Chronic diastolic heart failure (HCC) (Primary) Patient is presently doing well without recent hospitalization for heart  failure.  Unable to tolerate any guideline directed medical therapy in view of severe orthostatic hypotension.  Patient presently taking midodrine 10 mg 1/2 tablet twice daily.  2.  Peripheral arterial disease with no significant symptoms of claudication, capillary refill <2 seconds. Patient has not had any recurrence of claudication symptoms.  She has stopped smoking 4 months ago but is using nicotine free vapes which have reinforced complete abstinence from any types of vapes or tobacco products..  Presently has discontinued taking atorvastatin 40 mg as she had some form of intolerance probably drug rash.  Advised her to restart atorvastatin at 20 mg and see how she does.  - atorvastatin (LIPITOR) 20 MG tablet; Take 1 tablet (20 mg total) by mouth daily.  Dispense: 30 tablet; Refill: 6  3. Orthostatic hypotension No significant symptoms of dizziness or syncope.  4. Supine hypertension Presently not on any antihypertensive medications, blood pressure today was normal.  5. Musculoskeletal chest pain She has had occasional sharp chest pain, unrelated to exertion, lasting a few seconds, do not suspect angina pectoris.  Overall she remained stable from cardiac standpoint, I will see her back on annual basis in view of PAD.  Other orders - acetaminophen (TYLENOL) 650 MG CR tablet; Take 650 mg by mouth every 8 (eight) hours as needed for pain. Arthritis relief  Signed,  Yates Decamp, MD, [facility] 01/20/2024, 10:26 PM [facility] [address] [place] [place], [place] [address] Phone: [phone]. Fax:  [phone]

## 2024-01-20 NOTE — Patient Instructions (Signed)
Medication Instructions:  Your physician has recommended you make the following change in your medication:  Start Atorvastatin 20 mg by mouth daily   *If you need a refill on your cardiac medications before your next appointment, please call your pharmacy*   Lab Work: none If you have labs (blood work) drawn today and your tests are completely normal, you will receive your results only by: MyChart Message (if you have MyChart) OR A paper copy in the mail If you have any lab test that is abnormal or we need to change your treatment, we will call you to review the results.   Testing/Procedures: none   Follow-Up: At [facility], you and your health needs are our priority.  As part of our continuing mission to provide you with exceptional heart care, we have created designated Provider Care Teams.  These Care Teams include your primary Cardiologist (physician) and Advanced Practice Providers (APPs -  Physician Assistants and Nurse Practitioners) who all work together to provide you with the care you need, when you need it.  We recommend signing up for the patient portal called "MyChart".  Sign up information is provided on this After Visit Summary.  MyChart is used to connect with patients for Virtual Visits (Telemedicine).  Patients are able to view lab/test results, encounter notes, upcoming appointments, etc.  Non-urgent messages can be sent to your provider as well.   To learn more about what you can do with MyChart, go to [email or web address].    Your next appointment:   12 month(s)  Provider:   Yates Decamp, MD     Other Instructions

## 2024-01-21 DIAGNOSIS — Z992 Dependence on renal dialysis: Secondary | ICD-10-CM | POA: Diagnosis not present

## 2024-01-21 DIAGNOSIS — D689 Coagulation defect, unspecified: Secondary | ICD-10-CM | POA: Diagnosis not present

## 2024-01-21 DIAGNOSIS — N2581 Secondary hyperparathyroidism of renal origin: Secondary | ICD-10-CM | POA: Diagnosis not present

## 2024-01-21 DIAGNOSIS — D509 Iron deficiency anemia, unspecified: Secondary | ICD-10-CM | POA: Diagnosis not present

## 2024-01-21 DIAGNOSIS — E875 Hyperkalemia: Secondary | ICD-10-CM | POA: Diagnosis not present

## 2024-01-21 DIAGNOSIS — N186 End stage renal disease: Secondary | ICD-10-CM | POA: Diagnosis not present

## 2024-01-23 DIAGNOSIS — N2581 Secondary hyperparathyroidism of renal origin: Secondary | ICD-10-CM | POA: Diagnosis not present

## 2024-01-23 DIAGNOSIS — D509 Iron deficiency anemia, unspecified: Secondary | ICD-10-CM | POA: Diagnosis not present

## 2024-01-23 DIAGNOSIS — Z992 Dependence on renal dialysis: Secondary | ICD-10-CM | POA: Diagnosis not present

## 2024-01-23 DIAGNOSIS — D689 Coagulation defect, unspecified: Secondary | ICD-10-CM | POA: Diagnosis not present

## 2024-01-23 DIAGNOSIS — E875 Hyperkalemia: Secondary | ICD-10-CM | POA: Diagnosis not present

## 2024-01-23 DIAGNOSIS — N186 End stage renal disease: Secondary | ICD-10-CM | POA: Diagnosis not present

## 2024-01-26 DIAGNOSIS — D509 Iron deficiency anemia, unspecified: Secondary | ICD-10-CM | POA: Diagnosis not present

## 2024-01-26 DIAGNOSIS — E875 Hyperkalemia: Secondary | ICD-10-CM | POA: Diagnosis not present

## 2024-01-26 DIAGNOSIS — N2581 Secondary hyperparathyroidism of renal origin: Secondary | ICD-10-CM | POA: Diagnosis not present

## 2024-01-26 DIAGNOSIS — Z992 Dependence on renal dialysis: Secondary | ICD-10-CM | POA: Diagnosis not present

## 2024-01-26 DIAGNOSIS — N186 End stage renal disease: Secondary | ICD-10-CM | POA: Diagnosis not present

## 2024-01-26 DIAGNOSIS — D689 Coagulation defect, unspecified: Secondary | ICD-10-CM | POA: Diagnosis not present

## 2024-01-28 DIAGNOSIS — E875 Hyperkalemia: Secondary | ICD-10-CM | POA: Diagnosis not present

## 2024-01-28 DIAGNOSIS — N186 End stage renal disease: Secondary | ICD-10-CM | POA: Diagnosis not present

## 2024-01-28 DIAGNOSIS — N2581 Secondary hyperparathyroidism of renal origin: Secondary | ICD-10-CM | POA: Diagnosis not present

## 2024-01-28 DIAGNOSIS — D509 Iron deficiency anemia, unspecified: Secondary | ICD-10-CM | POA: Diagnosis not present

## 2024-01-28 DIAGNOSIS — D689 Coagulation defect, unspecified: Secondary | ICD-10-CM | POA: Diagnosis not present

## 2024-01-28 DIAGNOSIS — Z992 Dependence on renal dialysis: Secondary | ICD-10-CM | POA: Diagnosis not present

## 2024-01-29 ENCOUNTER — Emergency Department (HOSPITAL_COMMUNITY): Payer: 59

## 2024-01-29 ENCOUNTER — Encounter (HOSPITAL_COMMUNITY): Payer: Self-pay

## 2024-01-29 ENCOUNTER — Emergency Department (HOSPITAL_COMMUNITY)
Admission: EM | Admit: 2024-01-29 | Discharge: 2024-01-29 | Disposition: A | Discharge status: Home / Self Care | Payer: 59 | Attending: Emergency Medicine | Admitting: Emergency Medicine

## 2024-01-29 ENCOUNTER — Other Ambulatory Visit: Payer: Self-pay

## 2024-01-29 DIAGNOSIS — S42301A Unspecified fracture of shaft of humerus, right arm, initial encounter for closed fracture: Secondary | ICD-10-CM | POA: Diagnosis not present

## 2024-01-29 DIAGNOSIS — S8002XA Contusion of left knee, initial encounter: Secondary | ICD-10-CM | POA: Insufficient documentation

## 2024-01-29 DIAGNOSIS — S42294A Other nondisplaced fracture of upper end of right humerus, initial encounter for closed fracture: Secondary | ICD-10-CM | POA: Diagnosis not present

## 2024-01-29 DIAGNOSIS — M19011 Primary osteoarthritis, right shoulder: Secondary | ICD-10-CM | POA: Diagnosis not present

## 2024-01-29 DIAGNOSIS — Y92512 Supermarket, store or market as the place of occurrence of the external cause: Secondary | ICD-10-CM | POA: Diagnosis not present

## 2024-01-29 DIAGNOSIS — M79621 Pain in right upper arm: Secondary | ICD-10-CM | POA: Diagnosis not present

## 2024-01-29 DIAGNOSIS — N186 End stage renal disease: Secondary | ICD-10-CM | POA: Insufficient documentation

## 2024-01-29 DIAGNOSIS — S93401A Sprain of unspecified ligament of right ankle, initial encounter: Secondary | ICD-10-CM | POA: Diagnosis not present

## 2024-01-29 DIAGNOSIS — S0990XA Unspecified injury of head, initial encounter: Secondary | ICD-10-CM | POA: Diagnosis not present

## 2024-01-29 DIAGNOSIS — M25462 Effusion, left knee: Secondary | ICD-10-CM | POA: Diagnosis not present

## 2024-01-29 DIAGNOSIS — M79605 Pain in left leg: Secondary | ICD-10-CM

## 2024-01-29 DIAGNOSIS — W01198A Fall on same level from slipping, tripping and stumbling with subsequent striking against other object, initial encounter: Secondary | ICD-10-CM | POA: Insufficient documentation

## 2024-01-29 DIAGNOSIS — M25511 Pain in right shoulder: Secondary | ICD-10-CM | POA: Diagnosis not present

## 2024-01-29 DIAGNOSIS — M7581 Other shoulder lesions, right shoulder: Secondary | ICD-10-CM | POA: Diagnosis not present

## 2024-01-29 DIAGNOSIS — M79652 Pain in left thigh: Secondary | ICD-10-CM | POA: Diagnosis not present

## 2024-01-29 DIAGNOSIS — M25571 Pain in right ankle and joints of right foot: Secondary | ICD-10-CM | POA: Diagnosis not present

## 2024-01-29 DIAGNOSIS — I700 Atherosclerosis of aorta: Secondary | ICD-10-CM | POA: Diagnosis not present

## 2024-01-29 DIAGNOSIS — M25512 Pain in left shoulder: Secondary | ICD-10-CM | POA: Diagnosis not present

## 2024-01-29 DIAGNOSIS — M25562 Pain in left knee: Secondary | ICD-10-CM | POA: Diagnosis not present

## 2024-01-29 DIAGNOSIS — Z992 Dependence on renal dialysis: Secondary | ICD-10-CM | POA: Diagnosis not present

## 2024-01-29 DIAGNOSIS — M25552 Pain in left hip: Secondary | ICD-10-CM | POA: Diagnosis not present

## 2024-01-29 DIAGNOSIS — Z7901 Long term (current) use of anticoagulants: Secondary | ICD-10-CM | POA: Insufficient documentation

## 2024-01-29 DIAGNOSIS — I6782 Cerebral ischemia: Secondary | ICD-10-CM | POA: Insufficient documentation

## 2024-01-29 DIAGNOSIS — R6889 Other general symptoms and signs: Secondary | ICD-10-CM | POA: Diagnosis not present

## 2024-01-29 DIAGNOSIS — S42291A Other displaced fracture of upper end of right humerus, initial encounter for closed fracture: Secondary | ICD-10-CM | POA: Diagnosis not present

## 2024-01-29 DIAGNOSIS — M85871 Other specified disorders of bone density and structure, right ankle and foot: Secondary | ICD-10-CM | POA: Diagnosis not present

## 2024-01-29 DIAGNOSIS — W19XXXA Unspecified fall, initial encounter: Secondary | ICD-10-CM | POA: Diagnosis not present

## 2024-01-29 LAB — BASIC METABOLIC PANEL
Anion gap: 14 (ref 5–15)
BUN: 19 mg/dL (ref 8–23)
CO2: 23 mmol/L (ref 22–32)
Calcium: 9.4 mg/dL (ref 8.9–10.3)
Chloride: 99 mmol/L (ref 98–111)
Creatinine, Ser: 7.35 mg/dL — ABNORMAL HIGH (ref 0.44–1.00)
GFR, Estimated: 6 mL/min — ABNORMAL LOW (ref >60)
Glucose, Bld: 97 mg/dL (ref 70–99)
Potassium: 4.8 mmol/L (ref 3.5–5.1)
Sodium: 136 mmol/L (ref 135–145)

## 2024-01-29 LAB — CBC WITH DIFFERENTIAL/PLATELET
Abs Immature Granulocytes: 0.05 10*3/uL (ref 0.00–0.07)
Basophils Absolute: 0.1 10*3/uL (ref 0.0–0.1)
Basophils Relative: 0 %
Eosinophils Absolute: 0.3 10*3/uL (ref 0.0–0.5)
Eosinophils Relative: 3 %
HCT: 38.4 % (ref 36.0–46.0)
Hemoglobin: 11.9 g/dL — ABNORMAL LOW (ref 12.0–15.0)
Immature Granulocytes: 0 %
Lymphocytes Relative: 17 %
Lymphs Abs: 2.3 10*3/uL (ref 0.7–4.0)
MCH: 29.8 pg (ref 26.0–34.0)
MCHC: 31 g/dL (ref 30.0–36.0)
MCV: 96 fL (ref 80.0–100.0)
Monocytes Absolute: 0.9 10*3/uL (ref 0.1–1.0)
Monocytes Relative: 7 %
Neutro Abs: 10.1 10*3/uL — ABNORMAL HIGH (ref 1.7–7.7)
Neutrophils Relative %: 73 %
Platelets: 186 10*3/uL (ref 150–400)
RBC: 4 MIL/uL (ref 3.87–5.11)
RDW: 13.8 % (ref 11.5–15.5)
WBC: 13.7 10*3/uL — ABNORMAL HIGH (ref 4.0–10.5)
nRBC: 0 % (ref 0.0–0.2)

## 2024-01-29 MED ORDER — OXYCODONE HCL 5 MG PO TABS
5.0000 mg | ORAL_TABLET | Freq: Once | ORAL | Status: DC
  Filled 2024-01-29: qty 1

## 2024-01-29 MED ORDER — OXYCODONE HCL 5 MG PO TABS: 5.0000 mg | ORAL_TABLET | ORAL | 0 refills | Status: DC | PRN

## 2024-01-29 MED ORDER — ONDANSETRON HCL 4 MG/2ML IJ SOLN
4.0000 mg | Freq: Once | INTRAMUSCULAR | Status: AC
  Administered 2024-01-29: 4 mg via INTRAVENOUS
  Filled 2024-01-29: qty 2

## 2024-01-29 MED ORDER — HYDROMORPHONE HCL 1 MG/ML IJ SOLN
1.0000 mg | Freq: Once | INTRAMUSCULAR | Status: AC
  Administered 2024-01-29: 1 mg via INTRAVENOUS
  Filled 2024-01-29: qty 1

## 2024-01-29 MED ORDER — OXYCODONE HCL 5 MG PO TABS: 5.0000 mg | ORAL_TABLET | Freq: Once | ORAL | Status: DC

## 2024-01-29 MED ORDER — OXYCODONE HCL 5 MG PO TABS
5.0000 mg | ORAL_TABLET | Freq: Once | ORAL | Status: AC
  Administered 2024-01-29: 5 mg via ORAL
  Filled 2024-01-29: qty 1

## 2024-01-29 MED ORDER — HYDROMORPHONE HCL 1 MG/ML IJ SOLN
0.5000 mg | Freq: Once | INTRAMUSCULAR | Status: AC
  Administered 2024-01-29: 0.5 mg via INTRAVENOUS
  Filled 2024-01-29: qty 1

## 2024-01-29 NOTE — ED Notes (Signed)
Went to give patient pain meds, and she said she could not take it as she is nauseous. She then became anxious, stating she could not breathe but in NAD. Daughter bedside.

## 2024-01-29 NOTE — ED Notes (Signed)
This RN attempted IV access twice without success, another RN to try

## 2024-01-29 NOTE — ED Notes (Signed)
Pt c.o reoccurring nausea, pt given crackers and ginger ale. Will reassess.

## 2024-01-29 NOTE — Care Management (Signed)
Spoke with patient and family at bedside to discuss the recommendation for wheelchair at home to assist with mobility and ADL's patient is agreeable. Discussed DME companies referral was faxed to [employer] explained to patient and daughter that someone will call them from the office to process insurance for w/c. Information placed on the AVS. Updated EDP, no further ED CM needs identified.

## 2024-01-29 NOTE — ED Provider Notes (Signed)
[place] EMERGENCY DEPARTMENT AT [facility] Provider Note   CSN: [id number] Arrival date & time: 01/29/24  1101     History  Chief Complaint  Patient presents with   Whitney Walker Denym Christenberry is a 68 y.o. female.  Patient here after make an occult fall.  History of end-stage renal disease on hemodialysis.  On Eliquis.  She did not think she hit her head.  She is having pain mostly to her right shoulder left knee and right ankle.  She denies any back pain.  She denies any loss of consciousness.  She denies any abdominal pain or other extremity pain.  Denies any weakness numbness tingling.  The history is provided by the patient.       Home Medications Prior to Admission medications   Medication Sig Start Date End Date Taking? Authorizing Provider  acetaminophen (TYLENOL) 650 MG CR tablet Take 650 mg by mouth every 8 (eight) hours as needed for pain. Arthritis relief    [provider]  albuterol (VENTOLIN HFA) 108 (90 Base) MCG/ACT inhaler Inhale 2 puffs into the lungs every 6 (six) hours as needed for wheezing or shortness of breath. 09/18/23   Arnette Felts, FNP  atorvastatin (LIPITOR) 20 MG tablet Take 1 tablet (20 mg total) by mouth daily. 01/20/24 02/19/24  Yates Decamp, MD  ELIQUIS 5 MG TABS tablet TAKE 1 TABLET (5 MG TOTAL) BY MOUTH 2 (TWO) TIMES DAILY. 01/01/24   Arnette Felts, FNP  meclizine (ANTIVERT) 12.5 MG tablet Take 1 tablet (12.5 mg total) by mouth 3 (three) times daily as needed for dizziness. Patient taking differently: Take 12.5 mg by mouth as needed for dizziness. 09/18/23   Arnette Felts, FNP  midodrine (PROAMATINE) 10 MG tablet Take 0.5 tablets (5 mg total) by mouth 2 (two) times daily with a meal. 12/02/23   Azucena Fallen, MD  multivitamin (RENA-VIT) TABS tablet Take 1 tablet by mouth at bedtime.    [provider]  omeprazole (PRILOSEC) 20 MG capsule Take 1 capsule (20 mg total) by mouth daily. 09/18/23   Arnette Felts, FNP   sevelamer carbonate (RENVELA) 800 MG tablet Take 2-4 tablets by mouth See admin instructions. Take 4 tablets with breakfast, lunch, and dinner. Take 2 tablets with a snack 10/27/23   [provider]  sodium zirconium cyclosilicate (LOKELMA) 10 g PACK packet Take 10 g by mouth daily.    [provider]  Tiotropium Bromide-Olodaterol (STIOLTO RESPIMAT) 2.5-2.5 MCG/ACT AERS Inhale 2 puffs into the lungs daily. 09/19/23   Hunsucker, Lesia Sago, MD  vitamin E 200 UNIT capsule Take 200 Units by mouth daily.    [provider]      Allergies    Difelikefalin, Carnosine, Gadolinium derivatives, Iohexol, Iodinated contrast media, Iodine, and Naltrexone    Review of Systems   Review of Systems  Physical Exam Updated Vital Signs BP (!) 151/82 (BP Location: Left Leg)   Pulse 97   Temp 98.4 F (36.9 C) (Oral)   Resp 18   Ht 5\' 1"  (1.549 m)   Wt 90.3 kg   SpO2 98%   BMI 37.60 kg/m  Physical Exam Vitals and nursing note reviewed.  Constitutional:      General: She is not in acute distress.    Appearance: She is well-developed. She is not ill-appearing.  HENT:     Head: Normocephalic and atraumatic.     Nose: Nose normal.     Mouth/Throat:  Mouth: Mucous membranes are moist.  Eyes:     Extraocular Movements: Extraocular movements intact.     Conjunctiva/sclera: Conjunctivae normal.     Pupils: Pupils are equal, round, and reactive to light.  Neck:     Comments: No midline spinal tenderness Cardiovascular:     Rate and Rhythm: Normal rate and regular rhythm.     Pulses: Normal pulses.     Heart sounds: Normal heart sounds. No murmur heard. Pulmonary:     Effort: Pulmonary effort is normal. No respiratory distress.     Breath sounds: Normal breath sounds.  Abdominal:     General: Abdomen is flat.     Palpations: Abdomen is soft.     Tenderness: There is no abdominal tenderness.  Musculoskeletal:        General: Tenderness present. No swelling.      Cervical back: Normal range of motion and neck supple. No tenderness.     Comments: Tenderness to the left knee right ankle right shoulder left hip no midline spinal tenderness, small bruising and swelling to the left knee  Skin:    General: Skin is warm and dry.     Capillary Refill: Capillary refill takes less than 2 seconds.  Neurological:     General: No focal deficit present.     Mental Status: She is alert and oriented to person, place, and time.     Cranial Nerves: No cranial nerve deficit.     Sensory: No sensory deficit.     Motor: No weakness.     Coordination: Coordination normal.  Psychiatric:        Mood and Affect: Mood normal.     ED Results / Procedures / Treatments   Labs (all labs ordered are listed, but only abnormal results are displayed) Labs Reviewed  CBC WITH DIFFERENTIAL/PLATELET  BASIC METABOLIC PANEL    EKG None  Radiology CT Shoulder Right Wo Contrast Result Date: 01/29/2024 CLINICAL DATA:  Shoulder trauma, instability or dislocation suspected, xray done EXAM: CT OF THE UPPER RIGHT EXTREMITY WITHOUT CONTRAST TECHNIQUE: Multidetector CT imaging of the upper right extremity was performed according to the standard protocol. RADIATION DOSE REDUCTION: This exam was performed according to the departmental dose-optimization program which includes automated exposure control, adjustment of the mA and/or kV according to patient size and/or use of iterative reconstruction technique. COMPARISON:  Same-day x-ray FINDINGS: Bones/Joint/Cartilage Acute fracture involving the inferomedial aspect of the lesser tuberosity of the proximal right humerus with adjacent displaced fracture fragment which measures 21 x 5 x 12 mm. No fracture of the greater tuberosity or involving the articular surface of the humeral head. Glenohumeral joint alignment is maintained without dislocation. Joint spaces maintained. AC joint intact and unremarkable. No additional fractures. No lytic or  sclerotic bone lesion. Ligaments Suboptimally assessed by CT. Muscles and Tendons Grossly intact rotator cuff by CT. Findings of calcific tendinopathy of the distal infraspinatus tendon. No muscle atrophy. Soft tissues Soft tissue swelling at the fracture site. No organized hematoma. No right axillary lymphadenopathy. Included lung fields are clear. Aortic atherosclerosis. IMPRESSION: 1. Acute fracture of the right humeral head with adjacent displaced fracture fragment measuring 21 x 5 x 12 mm. 2. Glenohumeral joint alignment is maintained without dislocation. 3. Findings of calcific tendinopathy of the rotator cuff. 4. Aortic atherosclerosis (ICD10-I70.0). Electronically Signed   By: Duanne Guess D.O.   On: 01/29/2024 14:13   CT HEAD WO CONTRAST ( ) Result Date: 01/29/2024 CLINICAL DATA:  Polytrauma, blunt EXAM: CT HEAD  WITHOUT CONTRAST TECHNIQUE: Contiguous axial images were obtained from the base of the skull through the vertex without intravenous contrast. RADIATION DOSE REDUCTION: This exam was performed according to the departmental dose-optimization program which includes automated exposure control, adjustment of the mA and/or kV according to patient size and/or use of iterative reconstruction technique. COMPARISON:  05/16/2022 FINDINGS: Brain: No evidence of acute infarction, hemorrhage, hydrocephalus, extra-axial collection or mass lesion/mass effect. Scattered low-density changes within the periventricular and subcortical white matter most compatible with chronic microvascular ischemic change. Mild diffuse cerebral volume loss. Vascular: Atherosclerotic calcifications involving the large vessels of the skull base. No unexpected hyperdense vessel. Skull: Normal. Negative for fracture or focal lesion. Sinuses/Orbits: No acute finding. Other: None. IMPRESSION: 1. No acute intracranial findings. 2. Mild chronic microvascular ischemic change and cerebral volume loss. Electronically Signed   By:  Duanne Guess D.O.   On: 01/29/2024 13:11   DG Knee Complete 4 Views Left Result Date: 01/29/2024 CLINICAL DATA:  Left hip, thigh and knee pain after falling earlier today. EXAM: LEFT KNEE - COMPLETE 4+ VIEW; LEFT FEMUR 2 VIEWS; DG HIP (WITH OR WITHOUT PELVIS) 2-3V LEFT COMPARISON:  Radiographs 04/29/2022 and 05/07/2020. FINDINGS: The bones appear adequately mineralized. No evidence of acute fracture, dislocation or femoral head osteonecrosis. The hip joint spaces are preserved. There are mild tricompartmental degenerative changes at the left knee with a small nonspecific knee joint effusion. Prominent vascular calcifications and scattered vascular clips are noted within the proximal and distal thigh. IMPRESSION: 1. No evidence of acute fracture or dislocation in the left hip, femur or knee. 2. Mild tricompartmental degenerative changes at the left knee with small nonspecific knee joint effusion. Electronically Signed   By: Carey Bullocks M.D.   On: 01/29/2024 12:49   DG Hip Unilat With Pelvis 2-3 Views Left Result Date: 01/29/2024 CLINICAL DATA:  Left hip, thigh and knee pain after falling earlier today. EXAM: LEFT KNEE - COMPLETE 4+ VIEW; LEFT FEMUR 2 VIEWS; DG HIP (WITH OR WITHOUT PELVIS) 2-3V LEFT COMPARISON:  Radiographs 04/29/2022 and 05/07/2020. FINDINGS: The bones appear adequately mineralized. No evidence of acute fracture, dislocation or femoral head osteonecrosis. The hip joint spaces are preserved. There are mild tricompartmental degenerative changes at the left knee with a small nonspecific knee joint effusion. Prominent vascular calcifications and scattered vascular clips are noted within the proximal and distal thigh. IMPRESSION: 1. No evidence of acute fracture or dislocation in the left hip, femur or knee. 2. Mild tricompartmental degenerative changes at the left knee with small nonspecific knee joint effusion. Electronically Signed   By: Carey Bullocks M.D.   On: 01/29/2024 12:49   DG  Femur Min 2 Views Left Result Date: 01/29/2024 CLINICAL DATA:  Left hip, thigh and knee pain after falling earlier today. EXAM: LEFT KNEE - COMPLETE 4+ VIEW; LEFT FEMUR 2 VIEWS; DG HIP (WITH OR WITHOUT PELVIS) 2-3V LEFT COMPARISON:  Radiographs 04/29/2022 and 05/07/2020. FINDINGS: The bones appear adequately mineralized. No evidence of acute fracture, dislocation or femoral head osteonecrosis. The hip joint spaces are preserved. There are mild tricompartmental degenerative changes at the left knee with a small nonspecific knee joint effusion. Prominent vascular calcifications and scattered vascular clips are noted within the proximal and distal thigh. IMPRESSION: 1. No evidence of acute fracture or dislocation in the left hip, femur or knee. 2. Mild tricompartmental degenerative changes at the left knee with small nonspecific knee joint effusion. Electronically Signed   By: Carey Bullocks M.D.   On: 01/29/2024  12:49   DG Shoulder Right Result Date: 01/29/2024 CLINICAL DATA:  Multifocal pain, including pain in the right shoulder and upper arm. Patient reports falling earlier today. EXAM: RIGHT SHOULDER - 2+ VIEW; RIGHT HUMERUS - 2+ VIEW COMPARISON:  Right shoulder radiographs 09/11/2014. Chest CT 08/12/2023. FINDINGS: The bones appear adequately mineralized. The humeral head is located. No definite acute fracture or dislocation identified. Calcific density projects inferior to the right glenoid, not present on prior radiographs. This may relate to atherosclerotic calcification. The distal humerus appears intact. There are mild glenohumeral and acromioclavicular degenerative changes. IMPRESSION: No definite acute osseous findings identified. Calcific density projecting inferior to the right glenoid may relate to atherosclerotic calcification. If there is high clinical concern for acute osseous injury, consider further evaluation with CT. Electronically Signed   By: Carey Bullocks M.D.   On: 01/29/2024 12:46    DG Humerus Right Result Date: 01/29/2024 CLINICAL DATA:  Multifocal pain, including pain in the right shoulder and upper arm. Patient reports falling earlier today. EXAM: RIGHT SHOULDER - 2+ VIEW; RIGHT HUMERUS - 2+ VIEW COMPARISON:  Right shoulder radiographs 09/11/2014. Chest CT 08/12/2023. FINDINGS: The bones appear adequately mineralized. The humeral head is located. No definite acute fracture or dislocation identified. Calcific density projects inferior to the right glenoid, not present on prior radiographs. This may relate to atherosclerotic calcification. The distal humerus appears intact. There are mild glenohumeral and acromioclavicular degenerative changes. IMPRESSION: No definite acute osseous findings identified. Calcific density projecting inferior to the right glenoid may relate to atherosclerotic calcification. If there is high clinical concern for acute osseous injury, consider further evaluation with CT. Electronically Signed   By: Carey Bullocks M.D.   On: 01/29/2024 12:46   DG Ankle Complete Right Result Date: 01/29/2024 CLINICAL DATA:  Right ankle pain. EXAM: RIGHT ANKLE - COMPLETE 3+ VIEW COMPARISON:  None Available. FINDINGS: There is no acute fracture or dislocation. The bones are osteopenic. The ankle mortise is intact. Vascular calcifications noted. The soft tissues are unremarkable. IMPRESSION: 1. No acute fracture or dislocation. 2. Osteopenia. Electronically Signed   By: Elgie Collard M.D.   On: 01/29/2024 12:42    Procedures Procedures    Medications Ordered in ED Medications  oxyCODONE (Oxy IR/ROXICODONE) immediate release tablet 5 mg (has no administration in time range)  oxyCODONE (Oxy IR/ROXICODONE) immediate release tablet 5 mg (5 mg Oral Given 01/29/24 1132)  HYDROmorphone (DILAUDID) injection 1 mg (1 mg Intravenous Given 01/29/24 1505)  ondansetron (ZOFRAN) injection 4 mg (4 mg Intravenous Given 01/29/24 1505)    ED Course/ Medical Decision Making/ A&P                                  Medical Decision Making Amount and/or Complexity of Data Reviewed Labs: ordered. Radiology: ordered.  Risk Prescription drug management.   Whitney Walker is here after mechanical fall.  Pain to her left knee right shoulder right ankle.  She is got some tenderness to these areas.  She has no midline spinal tenderness.  She did not think that she hit her head.  She has a history of COPD CHF PE on Eliquis history of end-stage renal disease on hemodialysis.  Does not have any back pain abdominal pain.  She is got some bruising to the left knee were some tenderness.  Some tenderness to the right ankle.  Tenderness to the right shoulder.  Differential diagnosis likely contusion versus  sprain versus fracture or dislocation.  Will get head CT x-ray of the right shoulder left knee and right ankle.  X-rays were overall unremarkable except for may be occult fracture of the right shoulder.  Will get a CT scan.  CT of the head was unremarkable.  CT scan of the shoulder showed acute fracture of the right humeral head with adjacent displaced fracture fragment measuring 21 x 5 x 12 mm.  Overall I talked with Salvadore Farber with orthopedics and does recommend sling for comfort, weightbearing as tolerated.  She is having ongoing discomfort in her knee and ankle and in her shoulder.  I have talk with case management/social work and have PT OT evaluate her.  We are going to get her a cane and a walking boot Ace wrap and sling give her some more pain medicine and road test her to see if and get her comfortable for home.  I have ordered home health.  Patient handed off to oncoming ED staff pending reevaluation.  This chart was dictated using voice recognition software.  Despite best efforts to proofread,  errors can occur which can change the documentation meaning.         Final Clinical Impression(s) / ED Diagnoses Final diagnoses:  Other closed nondisplaced fracture of  proximal end of right humerus, initial encounter    Rx / DC Orders ED Discharge Orders     None         Virgina Norfolk, DO 01/29/24 1525

## 2024-01-29 NOTE — Evaluation (Signed)
Physical Therapy Evaluation Patient Details Name: Whitney Walker MRN: [id number] DOB: October 20, 1956 Today's Date: 01/29/2024  History of Present Illness  Pt in ED 1/30 due to fall after tripping going into a store with right ankle pain, left knee pain and right shoulder pain.  Right shoulder with possible small fx that orthopedics still reviewing.  Right ankle sprain (negative for fracture) with order for CAM boot and left knee swelling (order for ACE wrap)  negative for fx. PMH:  HD M,W,F  Clinical Impression  Pt admitted with above diagnosis. Pt anxious to go home.  Wanted to sit up and ultimately stand. Pt instructed not to put weight on the right UE until ortho MD clarifies if she can do so.  Pt was able to stand with CGA at edge of stretcher and take a few steps to head of stretcher with use of left UE only.  Pt can weight bear on bil LEs.  Ortho techs had not brought CAM boot right LE, ace wrap left knee or sling for right UE.  Called ortho techs to bring these items and they agreed to bring within the hour.  MD returned and stated ortho still deciding about right UE weight bearing.  Discussed with pt, CM and MD that if pt cannot weight bear right UE, will need Quad Cane to use with walking.  Pt adamant that she wants to go home today and feels she can go home and use quad cane.  MD discussed that pt can stay overnight to get all of these issues discussed and make plans for home however pt continuing to push for home. IF she goes home, will definitely need quad cane if she can't weight bear on right UE. IF she can weight bear, can continue to use rollator. Pt would benefit from HHPT f/u and HHOT as well. Will follow acutely. Pt currently with functional limitations due to the deficits listed below (see PT Problem List). Pt will benefit from acute skilled PT to increase their independence and safety with mobility to allow discharge.           If plan is discharge home, recommend the following: A  little help with walking and/or transfers;A little help with bathing/dressing/bathroom;Assistance with cooking/housework;Assist for transportation;Help with stairs or ramp for entrance   Can travel by private vehicle        Equipment Recommendations  [facility] - large base quad cane)  Recommendations for Other Services       Functional Status Assessment Patient has had a recent decline in their functional status and demonstrates the ability to make significant improvements in function in a reasonable and predictable amount of time.     Precautions / Restrictions Precautions Precautions: Fall;Shoulder Shoulder Interventions: Shoulder sling/immobilizer Precaution Comments: waiting on further input from ortho MD regarding sling use Required Braces or Orthoses: Other Brace Other Brace: CAM boot right foot, ace wrap left knee Restrictions Weight Bearing Restrictions Per Provider Order: Yes RUE Weight Bearing Per Provider Order: Non weight bearing RLE Weight Bearing Per Provider Order: Weight bearing as tolerated LLE Weight Bearing Per Provider Order: Weight bearing as tolerated      Mobility  Bed Mobility Overal bed mobility: Needs Assistance Bed Mobility: Supine to Sit     Supine to sit: Contact guard     General bed mobility comments: Pt takes incr time but can come to sitting using only left hand.    Transfers Overall transfer level: Needs assistance Equipment used: 1 person hand held  assist Transfers: Sit to/from Stand Sit to Stand: Contact guard assist           General transfer comment: Pt stood with CGA and was able to take side steps to [facility] with use of only left UE support.  Did not want to walk further.  Ortho techs had not brought sling and CAM boot. Pt was able to weight bear on bil LEs to take side steps.    Ambulation/Gait                  Stairs            Wheelchair Mobility     Tilt Bed    Modified Rankin (Stroke Patients Only)        Balance Overall balance assessment: Needs assistance Sitting-balance support: No upper extremity supported, Feet supported Sitting balance-Leahy Scale: Good     Standing balance support: Single extremity supported, No upper extremity supported, During functional activity Standing balance-Leahy Scale: Fair                               Pertinent Vitals/Pain Pain Assessment Pain Assessment: Faces Faces Pain Scale: Hurts whole lot Pain Location: right shoulder, left knee, and right ankle Pain Descriptors / Indicators: Aching, Discomfort, Grimacing, Guarding Pain Intervention(s): Ice applied, Utilized relaxation techniques, Repositioned, Monitored during session, Limited activity within patient's tolerance    Home Living Family/patient expects to be discharged to:: Private residence Living Arrangements: Alone Available Help at Discharge: Personal care attendant (M-Sun 3 hours day, has transport for HD) Type of Home: Apartment Home Access: Stairs to enter Entrance Stairs-Rails: Doctor, general practice of Steps: 1   Home Layout: One level Home Equipment: Agricultural consultant (2 wheels);Rollator (4 wheels);BSC/3in1;Shower seat      Prior Function Prior Level of Function : Independent/Modified Independent             Mobility Comments: uses motorized scooter in grocery store, rollator in home ADLs Comments: sits down in tub to bathe due to HD catheter, independent in self care and IADLs     Extremity/Trunk Assessment   Upper Extremity Assessment Upper Extremity Assessment: Right hand dominant;RUE deficits/detail RUE Deficits / Details: Difficult to assess due to pain and MD still deciding regarding restrictions RUE: Shoulder pain at rest    Lower Extremity Assessment Lower Extremity Assessment: RLE deficits/detail;LLE deficits/detail RLE Deficits / Details: grossly 3/5 except ankle NT RLE: Unable to fully assess due to pain LLE Deficits / Details:  grossly 3/5 LLE: Unable to fully assess due to pain    Cervical / Trunk Assessment Cervical / Trunk Assessment: Normal  Communication   Communication Communication: No apparent difficulties  Cognition Arousal: Alert Behavior During Therapy: WFL for tasks assessed/performed Overall Cognitive Status: Within Functional Limits for tasks assessed                                          General Comments General comments (skin integrity, edema, etc.): supine 161/91, 100 bpm; sitting 154/91, 108 bpm, could not get standing VS as pt too tired to stand that long and having pain right shoulder per pt.    Exercises     Assessment/Plan    PT Assessment Patient needs continued PT services  PT Problem List Decreased activity tolerance;Decreased balance;Decreased mobility;Decreased knowledge of use of DME;Decreased safety awareness;Decreased knowledge  of precautions;Pain;Obesity;Decreased range of motion       PT Treatment Interventions DME instruction;Gait training;Functional mobility training;Therapeutic activities;Therapeutic exercise;Balance training;Patient/family education;Stair training    PT Goals (Current goals can be found in the Care Plan section)  Acute Rehab PT Goals Patient Stated Goal: to go home today PT Goal Formulation: With patient Time For Goal Achievement: 02/12/24 Potential to Achieve Goals: Good    Frequency Min 1X/week     Co-evaluation               AM-PAC PT "6 Clicks" Mobility  Outcome Measure Help needed turning from your back to your side while in a flat bed without using bedrails?: A Little Help needed moving from lying on your back to sitting on the side of a flat bed without using bedrails?: A Little Help needed moving to and from a bed to a chair (including a wheelchair)?: A Little Help needed standing up from a chair using your arms (e.g., wheelchair or bedside chair)?: A Little Help needed to walk in hospital room?: A  Little Help needed climbing 3-5 steps with a railing? : A Little 6 Click Score: 18    End of Session Equipment Utilized During Treatment: Gait belt Activity Tolerance: Patient limited by fatigue;Patient limited by pain Patient left: with call bell/phone within reach (on stretcher) Nurse Communication: Mobility status;Patient requests pain meds (MD aware) PT Visit Diagnosis: Muscle weakness (generalized) (M62.81);Pain Pain - Right/Left:  (bil) Pain - part of body: Knee;Ankle and joints of foot;Shoulder (right shoulder, left knee, right ankle)    Time: 1610-9604 PT Time Calculation (min) (ACUTE ONLY): 54 min   Charges:   PT Evaluation $PT Eval Moderate Complexity: 1 Mod PT Treatments $Therapeutic Activity: 23-37 mins $Self Care/Home Management: 8-22 PT General Charges $$ ACUTE PT VISIT: 1 Visit         Fanny Agan M,PT Acute Rehab Services [phone]   Bevelyn Buckles 01/29/2024, 3:13 PM

## 2024-01-29 NOTE — Care Management (Signed)
    Durable Medical Equipment  (From admission, onward)           Start     Ordered   01/29/24 1921  For home use only DME standard manual wheelchair with seat cushion  Once       Comments: Patient suffers from left humerus fractures which impairs their ability to perform daily activities like toileting in the home.  A cane will not resolve issue with performing activities of daily living. A wheelchair will allow patient to safely perform daily activities. Patient can safely propel the wheelchair in the home or has a caregiver who can provide assistance. Length of need 6 months . Accessories: elevating leg rests (ELRs), wheel locks, extensions and anti-tippers.   01/29/24 1920

## 2024-01-29 NOTE — Progress Notes (Signed)
Transition of Care [facility]) - Emergency Department Mini Assessment   Patient Details  Name: Addalynne Golding MRN: [id number] Date of Birth: 29-Jun-1956  [facility]) CM/SW Contact:    Oletta Cohn, RN Phone Number: 01/29/2024, 2:52 PM   Clinical Narrative: RNCM met with pt, pt caregiver ([employer]), EDP and PT at bedside regarding disposition.  Pt lives home alone with one step into her apartment.  Pt had previously been active with [facility] for home health services and would like to resume with them should that be her discharge plan.  RNCM referred pt to [facility], Liaison with [facility] and Kandee Keen accepted referral.  Pt consult reveals need for large quad cane for home use.  RNCM referred to Zollie Beckers, liaison for [facility], who will bring quad cane to pt at bedside prior to discharge home.  TOC will continue to follow should disposition plan change.   ED Mini Assessment: What brought you to the Emergency Department? : (P) fall  Barriers to Discharge: (P) Continued Medical Work up  [employer] interventions: (P) DME  Means of departure: (P) Car  Interventions which prevented an admission or readmission: Home Health Consult or Services    Patient Contact and Communications        ,          Patient states their goals for this hospitalization and ongoing recovery are:: (P) to go home      Admission diagnosis:  fall Patient Active Problem List   Diagnosis Date Noted   Hyperkalemia 12/01/2023   Class 2 obesity with body mass index (BMI) of 37.0 to 37.9 in adult 11/25/2023   Rash 11/25/2023   Encounter for annual health examination 08/05/2023   History of smoking 10-25 pack years 08/05/2023   Malignant hypertension with heart failure and end-stage renal dis (HCC) 08/05/2023   Class 2 obesity due to excess calories with body mass index (BMI) of 36.0 to 36.9 in adult 08/05/2023   PAD (peripheral artery disease) (HCC) 08/05/2023   Vitamin D deficiency 08/05/2023   Atherosclerosis  of aorta (HCC) 07/29/2023   Muscle spasm of back 04/27/2023   Unspecified jaundice 03/05/2023   Pure hypercholesterolemia, unspecified 01/10/2023   Hypocalcemia 09/12/2022   Primary osteoarthritis of left hip 06/27/2022   Other chronic pain 06/27/2022   Hypertensive heart and kidney disease with heart failure and chronic kidney disease stage V (HCC) 06/27/2022   Left hip pain 05/16/2022   Tobacco abuse 05/16/2022   Orthostatic hypotension 05/16/2022   History of pulmonary embolism 05/01/2022   Protrusion of lumbar intervertebral disc 10/09/2021   Facet arthritis, degenerative, lumbar spine 10/09/2021   Nausea and vomiting    Abdominal pain    Polyarthritis    Polyarthralgia    Hyperkalemia, diminished renal excretion 05/28/2021   Generalized weakness 05/28/2021   Rash and nonspecific skin eruption 05/28/2021   Coronary artery calcification seen on CAT scan    Left leg pain 05/09/2020   Restless leg syndrome 09/19/2018   OSA (obstructive sleep apnea) 04/29/2018   ESRD (end stage renal disease) on dialysis (HCC) 04/29/2018   Dyspnea on exertion 03/12/2017   Morbid (severe) obesity due to excess calories (HCC) 03/12/2017   Anemia of chronic kidney failure, stage 3 (moderate) (HCC) 12/10/2016   History of colon cancer 08/12/2016   Mass of left breast on mammogram 12/10/2015   Depression 10/18/2015   Numbness and tingling of left arm and leg 01/24/2015   PVD (peripheral vascular disease) with claudication (HCC) 03/23/2014   Chronic  diastolic heart failure (HCC) 11/15/2013   PCP:  Arnette Felts, FNP Pharmacy:   [facility] - [place], [place] - [address] [place] [address] Phone: [phone] Fax: [phone]

## 2024-01-29 NOTE — Progress Notes (Signed)
Orthopedic Tech Progress Note Patient Details:  Matteson Blue 05-Nov-1956 [id number]  Sling applied to RUE for comfort. Pt refused CAM boot for RLE as she did not want anything touching the lateral side of her ankle and also stated she was not going to wear it once she leaves.  Ortho Devices Type of Ortho Device: CAM walker, Arm sling Ortho Device/Splint Location: RUE, RLE Ortho Device/Splint Interventions: Ordered, Application, Adjustment   Post Interventions Patient Tolerated: Fair, Refused intervention (refused CAM boot for RLE) Instructions Provided: Care of device, Adjustment of device  Ysela Hettinger Carmine Savoy 01/29/2024, 2:51 PM

## 2024-01-29 NOTE — ED Triage Notes (Signed)
Pt bib PTAR from general store, pt tripped on flooring at the store, her right shoulder hit the sliding doors during the fall. Pt c.o right shoulder pain, left knee and right ankle pain. No LOC, no head injury. Pt takes Eliquis. Pt dialysis MWF, had full session yesterday. Pt a.o

## 2024-01-29 NOTE — ED Notes (Signed)
Patient transported to radiology

## 2024-01-29 NOTE — Discharge Instructions (Addendum)
Weightbearing as tolerated with sling to the right upper extremity.  Take Roxicodone as prescribed for breakthrough pain.  Recommend Tylenol 1000 mg every 6 hours.  Follow-up with orthopedics.

## 2024-01-29 NOTE — ED Provider Notes (Signed)
  Physical Exam  BP (!) 147/79   Pulse 81   Temp 98 F (36.7 C) (Oral)   Resp 18   Ht 5\' 1"  (1.549 m)   Wt 90.3 kg   SpO2 100%   BMI 37.60 kg/m   Physical Exam  Procedures  Procedures  ED Course / MDM     Received care of pt from Dr. Lockie Mola.  Please see his note for prior hx, physical and care. Briefly this is 68yo female who had fall and found to have proximal humerus fracture. Awaiting quad cane and discussion of disposition.  Discussed we can admit for pain control or placement however she prefers to go home.  Given rx for oxycodone for pain to use prn, otherwise recommend tylenol for pain.  She was able to ambulate with quad cane. Declined boot because she felt it made the pain worse by putting pressure on her ankle.  Does request wheelchair as well given slow movement in setting of pain.   This was organized and will be delivered tomorrow. Patient discharged in stable condition with understanding of reasons to return.  Will follow up with Orthopedics.       Alvira Monday, MD 02/01/24 [phone]

## 2024-01-30 ENCOUNTER — Telehealth: Payer: Self-pay | Admitting: Surgery

## 2024-01-30 DIAGNOSIS — I129 Hypertensive chronic kidney disease with stage 1 through stage 4 chronic kidney disease, or unspecified chronic kidney disease: Secondary | ICD-10-CM | POA: Diagnosis not present

## 2024-01-30 DIAGNOSIS — G4733 Obstructive sleep apnea (adult) (pediatric): Secondary | ICD-10-CM | POA: Diagnosis not present

## 2024-01-30 DIAGNOSIS — N186 End stage renal disease: Secondary | ICD-10-CM | POA: Diagnosis not present

## 2024-01-30 DIAGNOSIS — R531 Weakness: Secondary | ICD-10-CM | POA: Diagnosis not present

## 2024-01-30 DIAGNOSIS — Z992 Dependence on renal dialysis: Secondary | ICD-10-CM | POA: Diagnosis not present

## 2024-01-30 DIAGNOSIS — I509 Heart failure, unspecified: Secondary | ICD-10-CM | POA: Diagnosis not present

## 2024-01-30 NOTE — Telephone Encounter (Signed)
ED CM contacted patient to follow up on /c delivery. Confirmed delivery this morning. No further ED CM identified.

## 2024-01-31 NOTE — ED Provider Notes (Incomplete)
  Physical Exam  BP (!) 147/79   Pulse 81   Temp 98 F (36.7 C) (Oral)   Resp 18   Ht 5\' 1"  (1.549 m)   Wt 90.3 kg   SpO2 100%   BMI 37.60 kg/m   Physical Exam  Procedures  Procedures  ED Course / MDM     Received care of pt from Dr. Lockie Mola.  Please see his note for prior hx, physical and care. Briefly this is 68yo female who had fall and found to have proximal humerus fracture.

## 2024-02-02 DIAGNOSIS — D509 Iron deficiency anemia, unspecified: Secondary | ICD-10-CM | POA: Diagnosis not present

## 2024-02-02 DIAGNOSIS — E875 Hyperkalemia: Secondary | ICD-10-CM | POA: Diagnosis not present

## 2024-02-02 DIAGNOSIS — D689 Coagulation defect, unspecified: Secondary | ICD-10-CM | POA: Diagnosis not present

## 2024-02-02 DIAGNOSIS — N2581 Secondary hyperparathyroidism of renal origin: Secondary | ICD-10-CM | POA: Diagnosis not present

## 2024-02-02 DIAGNOSIS — D631 Anemia in chronic kidney disease: Secondary | ICD-10-CM | POA: Diagnosis not present

## 2024-02-02 DIAGNOSIS — Z992 Dependence on renal dialysis: Secondary | ICD-10-CM | POA: Diagnosis not present

## 2024-02-02 DIAGNOSIS — N186 End stage renal disease: Secondary | ICD-10-CM | POA: Diagnosis not present

## 2024-02-03 ENCOUNTER — Ambulatory Visit: Payer: 59

## 2024-02-04 DIAGNOSIS — N2581 Secondary hyperparathyroidism of renal origin: Secondary | ICD-10-CM | POA: Diagnosis not present

## 2024-02-04 DIAGNOSIS — D509 Iron deficiency anemia, unspecified: Secondary | ICD-10-CM | POA: Diagnosis not present

## 2024-02-04 DIAGNOSIS — Z992 Dependence on renal dialysis: Secondary | ICD-10-CM | POA: Diagnosis not present

## 2024-02-04 DIAGNOSIS — D631 Anemia in chronic kidney disease: Secondary | ICD-10-CM | POA: Diagnosis not present

## 2024-02-04 DIAGNOSIS — N186 End stage renal disease: Secondary | ICD-10-CM | POA: Diagnosis not present

## 2024-02-04 DIAGNOSIS — D689 Coagulation defect, unspecified: Secondary | ICD-10-CM | POA: Diagnosis not present

## 2024-02-04 DIAGNOSIS — E875 Hyperkalemia: Secondary | ICD-10-CM | POA: Diagnosis not present

## 2024-02-05 ENCOUNTER — Telehealth: Payer: Self-pay | Admitting: *Deleted

## 2024-02-05 ENCOUNTER — Telehealth: Payer: Self-pay

## 2024-02-05 ENCOUNTER — Encounter: Payer: Self-pay | Admitting: *Deleted

## 2024-02-05 NOTE — Transitions of Care (Post Inpatient/ED Visit) (Signed)
   02/05/2024  Name: Whitney Walker MRN: [id number] DOB: [birth year]  Today's TOC FU Call Status: Today's TOC FU Call Status:: Successful TOC FU Call Completed TOC FU Call Complete Date: 02/05/24 [name] Name and Date of Birth confirmed.  Transition Care Management Follow-up Telephone Call Date of Discharge: 01/29/24 Discharge Facility: [facility]) Type of Discharge: Inpatient Admission Primary Inpatient Discharge Diagnosis:: Other closed nondisplaced fracture of proximal end of right humerus, initial encounter How have you been since you were released from the hospital?: Worse Any questions or concerns?: Yes Patient Questions/Concerns:: pain in side and lower back. Patient Questions/Concerns Addressed: Provided Patient Educational Materials  Items Reviewed: Did you receive and understand the discharge instructions provided?: Yes Medications obtained,verified, and reconciled?: Yes (Medications Reviewed) Any new allergies since your discharge?: No Dietary orders reviewed?: No Do you have support at home?: Yes People in Home: child(ren), dependent  Medications Reviewed Today: Medications Reviewed Today   Medications were not reviewed in this encounter     Home Care and Equipment/Supplies: Were Home Health Services Ordered?: No Any new equipment or medical supplies ordered?: No  Functional Questionnaire: Do you need assistance with bathing/showering or dressing?: Yes Do you need assistance with meal preparation?: Yes Do you need assistance with eating?: No Do you have difficulty maintaining continence: No Do you need assistance with getting out of bed/getting out of a chair/moving?: No Do you have difficulty managing or taking your medications?: No  Follow up appointments reviewed: PCP Follow-up appointment confirmed?: Yes Date of PCP follow-up appointment?: 02/09/24 Follow-up Provider: [name] Specialist [facility] Follow-up appointment confirmed?: Yes Date  of Specialist follow-up appointment?: 02/11/24 Do you need transportation to your follow-up appointment?: No Do you understand care options if your condition(s) worsen?: Yes-patient verbalized understanding    SIGNATURE Kristeen Lunger, CMA

## 2024-02-05 NOTE — Telephone Encounter (Signed)
 Patient called about pain in her back and side patient was encouraged to take medications as prescribed from ER. Patient is scheduled for Monday but if pain worsens over weekend patient was advised to go to ER.

## 2024-02-05 NOTE — Patient Outreach (Signed)
  Care Coordination   Follow Up Visit Note   02/05/2024 Name: Whitney Walker MRN: [id number] DOB: May 28, 1956  Whitney Walker is a 68 y.o. year old female who sees Georgina Speaks, FNP for primary care. I spoke with  [name] by phone today.  What matters to the patients health and wellness today?  Following up with PCP    Goals Addressed             This Visit's Progress    Follow-up with PCP       Care Management Goals: Patient will keep follow-up appointment with PCP on 02/09/24 Patient will reach out to Care Management team with any resource or care management needs        SDOH assessments and interventions completed:  Yes SDOH Interventions Today    Flowsheet Row Most Recent Value  SDOH Interventions   Transportation Interventions Patient Resources (Friends/Family)       Care Coordination Interventions:  Yes, provided  Interventions Today    Flowsheet Row Most Recent Value  Chronic Disease   Chronic disease during today's visit Other  [closed nondisplaced fracture of proximal end of right humerus]  General Interventions   General Interventions Discussed/Reviewed Doctor Visits, General Interventions Discussed, General Interventions Reviewed, Communication with  Doctor Visits Discussed/Reviewed Doctor Visits Discussed, Doctor Visits Reviewed, PCP, Specialist  PCP/Specialist Visits Compliance with follow-up visit  [patient will keep appointment with PCP on 02/09/24. Patient will schedule appointment with orthopedist if necessary.]  Communication with PCP/Specialists  [received staff message back from PCP office that they would reach out to patient to schedule a hospital follow-up appointment]       Follow up plan: Follow up call scheduled for 02/19/24 with [name], RN Care Manager    Encounter Outcome:  Patient Visit Completed   Josette Pellet, RN, BSN [place]  [facility], [facility] RN Care Manager Direct Dial :  [phone]

## 2024-02-05 NOTE — Patient Outreach (Signed)
  Care Coordination   Documentation  Note   02/05/2024 Name: Whitney Walker MRN: [id number] DOB: [birth year]  Whitney Walker is a 68 y.o. year old female who sees Whitney Speaks, FNP for primary care. I  reached out to Whitney Walker by telephone but was unable to reach her. I left a HIPAA compliant VM requesting a return telephone call. Whitney Walker reached out to the main [employer] office earlier today requesting assistance in scheduling a PCP follow-up appointment. She relayed that she was having difficulty scheduling this appointment. Whitney Walker asked if I could call the patient on behalf of Whitney Walker's established RN Care Manager, Whitney Walker.    What matters to the patients health and wellness today?  I did not speak directly with the patient today.    SDOH assessments and interventions completed:  No   Care Coordination Interventions:  Yes, provided  Interventions Today    Flowsheet Row Most Recent Value  Chronic Disease   Chronic disease during today's visit Other  [closed nondisplaced fracture of proximal end of right humerus]  General Interventions   General Interventions Discussed/Reviewed General Interventions Reviewed, Doctor Visits, Communication with  [Reviewed imaging reports from ED visit on 01/29/24]  Doctor Visits Discussed/Reviewed Doctor Visits Reviewed, Specialist  PCP/Specialist Visits Compliance with follow-up visit  [Per ED discharge instructions, patient was to follow-up with PCP and orthopedist]  Communication with PCP/Specialists  [Staff message sent to [facility] clinical & Admin pools requesting that they outreach patient to discuss scheduling needs & schedule an Appt. with PCP if appropriate. Advised that it does not look like she has seen orthopedist as recommended at ED discharge.]       Follow up plan: Follow up call scheduled for 02/19/24 with Whitney Ly, RN Care Manager    Encounter Outcome:  Patient Visit Completed    Josette Pellet, RN, BSN [place]  [facility], [facility] RN Care Manager Direct Dial : [phone]

## 2024-02-06 DIAGNOSIS — D689 Coagulation defect, unspecified: Secondary | ICD-10-CM | POA: Diagnosis not present

## 2024-02-06 DIAGNOSIS — D631 Anemia in chronic kidney disease: Secondary | ICD-10-CM | POA: Diagnosis not present

## 2024-02-06 DIAGNOSIS — N2581 Secondary hyperparathyroidism of renal origin: Secondary | ICD-10-CM | POA: Diagnosis not present

## 2024-02-06 DIAGNOSIS — E875 Hyperkalemia: Secondary | ICD-10-CM | POA: Diagnosis not present

## 2024-02-06 DIAGNOSIS — D509 Iron deficiency anemia, unspecified: Secondary | ICD-10-CM | POA: Diagnosis not present

## 2024-02-06 DIAGNOSIS — Z992 Dependence on renal dialysis: Secondary | ICD-10-CM | POA: Diagnosis not present

## 2024-02-06 DIAGNOSIS — N186 End stage renal disease: Secondary | ICD-10-CM | POA: Diagnosis not present

## 2024-02-09 ENCOUNTER — Encounter: Payer: Self-pay | Admitting: Nurse Practitioner

## 2024-02-09 ENCOUNTER — Ambulatory Visit (INDEPENDENT_AMBULATORY_CARE_PROVIDER_SITE_OTHER): Payer: 59 | Admitting: Nurse Practitioner

## 2024-02-09 VITALS — BP 110/70 | HR 71 | Temp 97.7°F | Ht 61.0 in | Wt 202.2 lb

## 2024-02-09 DIAGNOSIS — Z992 Dependence on renal dialysis: Secondary | ICD-10-CM | POA: Diagnosis not present

## 2024-02-09 DIAGNOSIS — M545 Low back pain, unspecified: Secondary | ICD-10-CM | POA: Diagnosis not present

## 2024-02-09 DIAGNOSIS — E875 Hyperkalemia: Secondary | ICD-10-CM | POA: Diagnosis not present

## 2024-02-09 DIAGNOSIS — D631 Anemia in chronic kidney disease: Secondary | ICD-10-CM | POA: Diagnosis not present

## 2024-02-09 DIAGNOSIS — N2581 Secondary hyperparathyroidism of renal origin: Secondary | ICD-10-CM | POA: Diagnosis not present

## 2024-02-09 DIAGNOSIS — S42294D Other nondisplaced fracture of upper end of right humerus, subsequent encounter for fracture with routine healing: Secondary | ICD-10-CM | POA: Diagnosis not present

## 2024-02-09 DIAGNOSIS — N186 End stage renal disease: Secondary | ICD-10-CM | POA: Diagnosis not present

## 2024-02-09 DIAGNOSIS — D509 Iron deficiency anemia, unspecified: Secondary | ICD-10-CM | POA: Diagnosis not present

## 2024-02-09 DIAGNOSIS — Z09 Encounter for follow-up examination after completed treatment for conditions other than malignant neoplasm: Secondary | ICD-10-CM

## 2024-02-09 DIAGNOSIS — D689 Coagulation defect, unspecified: Secondary | ICD-10-CM | POA: Diagnosis not present

## 2024-02-09 MED ORDER — ACETAMINOPHEN-CODEINE 300-30 MG PO TABS: 1.0000 | ORAL_TABLET | Freq: Three times a day (TID) | ORAL | 0 refills | Status: DC | PRN

## 2024-02-09 NOTE — Patient Instructions (Signed)
 You can take a tylenol  with codeine  3 times a day, do not take more than 4000 mg of tylenol  per day this includes your tylenol  with codeine 

## 2024-02-09 NOTE — Progress Notes (Signed)
 Madelaine Bhat, CMA,acting as a Neurosurgeon for Arnette Felts, FNP.,have documented all relevant documentation on the behalf of Arnette Felts, FNP,as directed by  Arnette Felts, FNP while in the presence of Arnette Felts, FNP.  Subjective:  Patient ID: Whitney Walker , female    DOB: 1956-08-22 , 68 y.o.   MRN: [id number]  Chief Complaint  Patient presents with   Hospitalization Follow-up    HPI  Patient presents today for a hospital follow up, Patient reports compliance with medication. Patient denies any chest pain, SOB, or headaches. Patient was diagnosed with Other closed nondisplaced fracture of proximal end of right humerus- Patient reports she is still having pain in her right shoulder and her legs. Patient does have brunsting and swelling in both legs. Patient reports she fell at the gas station and she tripped and heard something snap and hit her right shoulder. They had some ripped vinyl on the floor.   Patient reports her lower side back pain that she called about has subsided. Patient reports she was giving a sling but does not use it because it makes her arm go to sleep. Patient does see her ortho doctor tomorrow at 1020a. She is to have PT/OT. She has pain across her buttocks.   She has bruising to her left lateral calf and injured her knee. She is taking 1500 mg tylenol 3 times a day. She reports she took oxycodone and was unsure if she took too close, may have taken too close together.   She was unable to go for her mammogram due to not being able to lift her right shoulder.   Her daughter is wanting help with the patient with CAP. The transportation can not help the patient to get off the stairs. Marchelle Folks RN to do assessment for her CAP services. She has transportation to pick her up at 5:45 am on her days to go to dialysis.      Past Medical History:  Diagnosis Date   Anemia    Anxiety    CHF (congestive heart failure) (HCC)    Colon cancer (HCC)    treatment surgery    Complication of anesthesia    after first [facility]- [place] "couldnt walk after", patient denies having a spinal   COPD (chronic obstructive pulmonary disease) (HCC)    Coronary artery disease    Depression    DVT (deep venous thrombosis) (HCC)    Encounter for annual health examination 08/05/2023   ESRD on hemodialysis (HCC)    Hemo: MWF   History of blood transfusion 04/2018   Hypertension    07/07/18- no longer takes BP medications   Leucocytosis 04/28/2012   Meningitis    Pain in limb 07/30/2013   PE (pulmonary embolism)    Peripheral vascular disease (HCC)    Primary hypertension 11/15/2013   Restless legs    Shortness of breath    with exertion   Sleep apnea    SOB (shortness of breath) 03/03/2019   Vertigo      Family History  Problem Relation Age of Onset   Cancer Mother 86       breast and bone   Cancer Father 51       prostate   Hypertension Sister    Bleeding Disorder Sister    Hypertension Daughter    Cancer Cousin 20       breast cancer    Congestive Heart Failure Son    Breast cancer Neg Hx  Current Outpatient Medications:    acetaminophen-codeine (TYLENOL #3) 300-30 MG tablet, Take 1 tablet by mouth every 8 (eight) hours as needed for moderate pain (pain score 4-6)., Disp: 20 tablet, Rfl: 0   albuterol (VENTOLIN HFA) 108 (90 Base) MCG/ACT inhaler, Inhale 2 puffs into the lungs every 6 (six) hours as needed for wheezing or shortness of breath., Disp: 18 g, Rfl: 2   atorvastatin (LIPITOR) 20 MG tablet, Take 1 tablet (20 mg total) by mouth daily., Disp: 30 tablet, Rfl: 6   ELIQUIS 5 MG TABS tablet, TAKE 1 TABLET (5 MG TOTAL) BY MOUTH 2 (TWO) TIMES DAILY., Disp: 60 tablet, Rfl: 2   meclizine (ANTIVERT) 12.5 MG tablet, Take 1 tablet (12.5 mg total) by mouth 3 (three) times daily as needed for dizziness. (Patient taking differently: Take 12.5 mg by mouth as needed for dizziness.), Disp: 90 tablet, Rfl: 1   midodrine (PROAMATINE) 10 MG tablet, Take 0.5 tablets (5 mg  total) by mouth 2 (two) times daily with a meal., Disp: 60 tablet, Rfl: 0   multivitamin (RENA-VIT) TABS tablet, Take 1 tablet by mouth at bedtime., Disp: , Rfl:    omeprazole (PRILOSEC) 20 MG capsule, Take 1 capsule (20 mg total) by mouth daily., Disp: 90 capsule, Rfl: 1   sevelamer carbonate (RENVELA) 800 MG tablet, Take 2-4 tablets by mouth See admin instructions. Take 4 tablets with breakfast, lunch, and dinner. Take 2 tablets with a snack, Disp: , Rfl:    sodium zirconium cyclosilicate (LOKELMA) 10 g PACK packet, Take 10 g by mouth daily., Disp: , Rfl:    Tiotropium Bromide-Olodaterol (STIOLTO RESPIMAT) 2.5-2.5 MCG/ACT AERS, Inhale 2 puffs into the lungs daily., Disp: 4 g, Rfl: 11   vitamin E 200 UNIT capsule, Take 200 Units by mouth daily., Disp: , Rfl:    Allergies  Allergen Reactions   Difelikefalin Itching   Carnosine Other (See Comments)     Unknown   Gadolinium Derivatives Hives and Other (See Comments)    HIVES, Desc: HIVES W/ "DYE" USED FOR 1ST CT SCAN BUT NOT 2ND, NO PREMEDS USED, PT UNCERTAIN OF CIRCUMSTANCES,,?POSSIBLE MRI CONTRAST ALLERGY, ALL STUDIES DONE "SOMEWHERE" IN [place]//A.C., Onset Date: 13244010   Iohexol Hives and Other (See Comments)    Desc: HIVES W/ "DYE" USED FOR 1ST CT SCAN BUT NOT 2ND, NO PREMEDS USED, PT UNCERTAIN OF CIRCUMSTANCES,,?POSSIBLE MRI CONTRAST ALLERGY, ALL STUDIES DONE "SOMEWHERE" IN [place]//A.C., Onset Date: [id number]    Iodinated Contrast Media Hives    1974   Iodine Hives    1974   Naltrexone Other (See Comments)    Unknown 1974      Review of Systems  Constitutional: Negative.   Respiratory: Negative.    Cardiovascular: Negative.   Musculoskeletal:  Positive for back pain.       Fracture to right humerus  Skin: Negative.   Neurological: Negative.   Psychiatric/Behavioral: Negative.       Today's Vitals   02/09/24 1159  BP: 110/70  Pulse: 71  Temp: 97.7 F (36.5 C)  TempSrc: Oral  Weight: 202 lb 3.2 oz (91.7 kg)   Height: 5\' 1"  (1.549 m)  PainSc: 10-Worst pain ever  PainLoc: Shoulder   Body mass index is 38.21 kg/m.  Wt Readings from Last 3 Encounters:  02/09/24 202 lb 3.2 oz (91.7 kg)  01/29/24 199 lb (90.3 kg)  01/20/24 200 lb 3.2 oz (90.8 kg)    The ASCVD Risk score (Arnett DK, et al., 2019) failed to calculate for the  following reasons:   Risk score cannot be calculated because patient has a medical history suggesting prior/existing ASCVD  Objective:  Physical Exam Vitals reviewed.  Constitutional:      General: She is not in acute distress.    Appearance: Normal appearance. She is obese.  Cardiovascular:     Rate and Rhythm: Normal rate and regular rhythm.     Pulses: Normal pulses.     Heart sounds: Normal heart sounds. No murmur heard. Pulmonary:     Effort: Pulmonary effort is normal. No respiratory distress.     Breath sounds: Normal breath sounds. No wheezing.  Musculoskeletal:        General: No swelling. Normal range of motion.     Comments: She is not wearing a sling due to causing her more pain  Skin:    General: Skin is warm.     Capillary Refill: Capillary refill takes less than 2 seconds.     Findings: Bruising present.  Neurological:     General: No focal deficit present.     Mental Status: She is alert and oriented to person, place, and time.     Cranial Nerves: No cranial nerve deficit.     Motor: No weakness.         Assessment And Plan:  Other closed nondisplaced fracture of proximal end of right humerus with routine healing, subsequent encounter Assessment & Plan: She is to f/u with Orthopedics this week. She is not wearing her sling due to causing more pain.    Acute left-sided low back pain without sciatica Assessment & Plan: Her pain has improved since she called last week. I did provide her with a limited supply of tylenol with codeine for moderate pain. She is aware to not take more than 4000 mg of tylenol per day.    Other orders -      Acetaminophen-Codeine; Take 1 tablet by mouth every 8 (eight) hours as needed for moderate pain (pain score 4-6).  Dispense: 20 tablet; Refill: 0    Return in about 4 months (around 06/08/2024) for bp.  Patient was given opportunity to ask questions. Patient verbalized understanding of the plan and was able to repeat key elements of the plan. All questions were answered to their satisfaction.    Jeanell Sparrow, FNP, have reviewed all documentation for this visit. The documentation on 02/09/24 for the exam, diagnosis, procedures, and orders are all accurate and complete.   IF YOU HAVE BEEN REFERRED TO A SPECIALIST, IT MAY TAKE 1-2 WEEKS TO SCHEDULE/PROCESS THE REFERRAL. IF YOU HAVE NOT HEARD FROM US/SPECIALIST IN TWO WEEKS, PLEASE GIVE [place] A CALL AT [phone].

## 2024-02-10 DIAGNOSIS — S42294A Other nondisplaced fracture of upper end of right humerus, initial encounter for closed fracture: Secondary | ICD-10-CM | POA: Diagnosis not present

## 2024-02-11 DIAGNOSIS — D631 Anemia in chronic kidney disease: Secondary | ICD-10-CM | POA: Diagnosis not present

## 2024-02-11 DIAGNOSIS — D689 Coagulation defect, unspecified: Secondary | ICD-10-CM | POA: Diagnosis not present

## 2024-02-11 DIAGNOSIS — N186 End stage renal disease: Secondary | ICD-10-CM | POA: Diagnosis not present

## 2024-02-11 DIAGNOSIS — E875 Hyperkalemia: Secondary | ICD-10-CM | POA: Diagnosis not present

## 2024-02-11 DIAGNOSIS — D509 Iron deficiency anemia, unspecified: Secondary | ICD-10-CM | POA: Diagnosis not present

## 2024-02-11 DIAGNOSIS — N2581 Secondary hyperparathyroidism of renal origin: Secondary | ICD-10-CM | POA: Diagnosis not present

## 2024-02-11 DIAGNOSIS — Z992 Dependence on renal dialysis: Secondary | ICD-10-CM | POA: Diagnosis not present

## 2024-02-13 DIAGNOSIS — N186 End stage renal disease: Secondary | ICD-10-CM | POA: Diagnosis not present

## 2024-02-13 DIAGNOSIS — D509 Iron deficiency anemia, unspecified: Secondary | ICD-10-CM | POA: Diagnosis not present

## 2024-02-13 DIAGNOSIS — D689 Coagulation defect, unspecified: Secondary | ICD-10-CM | POA: Diagnosis not present

## 2024-02-13 DIAGNOSIS — Z992 Dependence on renal dialysis: Secondary | ICD-10-CM | POA: Diagnosis not present

## 2024-02-13 DIAGNOSIS — N2581 Secondary hyperparathyroidism of renal origin: Secondary | ICD-10-CM | POA: Diagnosis not present

## 2024-02-13 DIAGNOSIS — D631 Anemia in chronic kidney disease: Secondary | ICD-10-CM | POA: Diagnosis not present

## 2024-02-13 DIAGNOSIS — E875 Hyperkalemia: Secondary | ICD-10-CM | POA: Diagnosis not present

## 2024-02-16 DIAGNOSIS — D631 Anemia in chronic kidney disease: Secondary | ICD-10-CM | POA: Diagnosis not present

## 2024-02-16 DIAGNOSIS — D509 Iron deficiency anemia, unspecified: Secondary | ICD-10-CM | POA: Diagnosis not present

## 2024-02-16 DIAGNOSIS — D689 Coagulation defect, unspecified: Secondary | ICD-10-CM | POA: Diagnosis not present

## 2024-02-16 DIAGNOSIS — E875 Hyperkalemia: Secondary | ICD-10-CM | POA: Diagnosis not present

## 2024-02-16 DIAGNOSIS — N186 End stage renal disease: Secondary | ICD-10-CM | POA: Diagnosis not present

## 2024-02-16 DIAGNOSIS — Z992 Dependence on renal dialysis: Secondary | ICD-10-CM | POA: Diagnosis not present

## 2024-02-16 DIAGNOSIS — N2581 Secondary hyperparathyroidism of renal origin: Secondary | ICD-10-CM | POA: Diagnosis not present

## 2024-02-17 DIAGNOSIS — S93401A Sprain of unspecified ligament of right ankle, initial encounter: Secondary | ICD-10-CM | POA: Diagnosis not present

## 2024-02-18 DIAGNOSIS — Z992 Dependence on renal dialysis: Secondary | ICD-10-CM | POA: Diagnosis not present

## 2024-02-18 DIAGNOSIS — D509 Iron deficiency anemia, unspecified: Secondary | ICD-10-CM | POA: Diagnosis not present

## 2024-02-18 DIAGNOSIS — N186 End stage renal disease: Secondary | ICD-10-CM | POA: Diagnosis not present

## 2024-02-18 DIAGNOSIS — N2581 Secondary hyperparathyroidism of renal origin: Secondary | ICD-10-CM | POA: Diagnosis not present

## 2024-02-18 DIAGNOSIS — D689 Coagulation defect, unspecified: Secondary | ICD-10-CM | POA: Diagnosis not present

## 2024-02-18 DIAGNOSIS — D631 Anemia in chronic kidney disease: Secondary | ICD-10-CM | POA: Diagnosis not present

## 2024-02-18 DIAGNOSIS — E875 Hyperkalemia: Secondary | ICD-10-CM | POA: Diagnosis not present

## 2024-02-19 ENCOUNTER — Ambulatory Visit: Payer: Self-pay

## 2024-02-19 NOTE — Patient Outreach (Signed)
Care Coordination   Follow Up Visit Note   02/19/2024 Name: Whitney Walker MRN: [id number] DOB: [birth year]  Whitney Walker is a 68 y.o. year old female who sees Arnette Felts, FNP for primary care. I spoke with  Whitney Walker by phone today.  What matters to the patients health and wellness today?  Patient would like to start in home PT to help with her recovery from having a right Humerus fracture and right ankle sprain.     Goals Addressed             This Visit's Progress    To avoid having more falls   Not on track    Care Coordination Interventions: Provided written and verbal education re: potential causes of falls and Fall prevention strategies Advised patient of importance of notifying provider of falls Assessed for falls since last encounter Assessed patients knowledge of fall risk prevention secondary to previously provided education Advised patient to discuss PT referral with provider Reviewed and discussed patient's recent fall that occurred while she was walking inside a [employer] gas station, she tripped over a loose carpet tile  Determined patient suffered from having a closed nondisplaced right Humerus and right ankle sprain  Reviewed and discussed the prescribed plan of care per Dr. Thad Ranger with [facility] Plan of care:  Okay to wean from sling Okay for active range of motion and weightbearing, no strengthening Home PT if able Tall boot provided today Follow up: 3 weeks  Determined patient verbalizes understanding of her prescribed plan of care Placed outbound call to [facility], spoke with Drenda Freeze regarding patient's referral for PT Discussed the referral was sent to the in house PT, discussed patient will need in home PT, she has used [facility] in the past Discussed Drenda Freeze will send Dr. Thad Ranger a message to ask for an external referral to [facility] for PT, someone will contact the patient to advise, patient made  aware Discussed plans with patient for ongoing care coordination follow up and provided patient with direct contact information for nurse care coordinator       Interventions Today    Flowsheet Row Most Recent Value  Chronic Disease   Chronic disease during today's visit Other  [s/p fall, right ankle sprain,  Other closed nondisplaced fracture of proximal end of right humerus]  General Interventions   General Interventions Discussed/Reviewed General Interventions Discussed, General Interventions Reviewed, Doctor Visits, Durable Medical Equipment (DME)  Doctor Visits Discussed/Reviewed Doctor Visits Discussed, Doctor Visits Reviewed, Specialist, PCP  Durable Medical Equipment (DME) Other  [quad cane, leg boot]  Exercise Interventions   Exercise Discussed/Reviewed Physical Activity  Physical Activity Discussed/Reviewed Physical Activity Reviewed, Physical Activity Discussed  Education Interventions   Education Provided Provided Education  Provided Verbal Education On Medication, When to see the doctor  Pharmacy Interventions   Pharmacy Dicussed/Reviewed Pharmacy Topics Reviewed, Pharmacy Topics Discussed, Medications and their functions  Safety Interventions   Safety Discussed/Reviewed Fall Risk, Home Safety, Safety Reviewed, Safety Discussed  Home Safety Assistive Devices          SDOH assessments and interventions completed:  No     Care Coordination Interventions:  Yes, provided   Follow up plan: Follow up call scheduled for 03/02/24 @12 :00 PM    Encounter Outcome:  Patient Visit Completed

## 2024-02-19 NOTE — Patient Instructions (Signed)
Visit Information  Thank you for taking time to visit with me today. Please don't hesitate to contact me if I can be of assistance to you.   Following are the goals we discussed today:   Goals Addressed             This Visit's Progress    To avoid having more falls   Not on track    Care Coordination Interventions: Provided written and verbal education re: potential causes of falls and Fall prevention strategies Advised patient of importance of notifying provider of falls Assessed for falls since last encounter Assessed patients knowledge of fall risk prevention secondary to previously provided education Advised patient to discuss PT referral with provider Reviewed and discussed patient's recent fall that occurred while she was walking inside a [employer] gas station, she tripped over a loose carpet tile  Determined patient suffered from having a closed nondisplaced right Humerus and right ankle sprain  Reviewed and discussed the prescribed plan of care per Dr. Thad Ranger with [facility] Plan of care:  Okay to wean from sling Okay for active range of motion and weightbearing, no strengthening Home PT if able Tall boot provided today Follow up: 3 weeks  Determined patient verbalizes understanding of her prescribed plan of care Placed outbound call to [facility], spoke with Drenda Freeze regarding patient's referral for PT Discussed the referral was sent to the in house PT, discussed patient will need in home PT, she has used [facility] in the past Discussed Drenda Freeze will send Dr. Thad Ranger a message to ask for an external referral to [facility] for PT, someone will contact the patient to advise, patient made aware Discussed plans with patient for ongoing care coordination follow up and provided patient with direct contact information for nurse care coordinator         Our next appointment is by telephone on 03/02/24 at 12:00 PM  Please call the care guide team at [phone]  if you need to cancel or reschedule your appointment.   If you are experiencing a Mental Health or Behavioral Health Crisis or need someone to talk to, please call 1-800-273-TALK (toll free, 24 hour hotline)  Patient verbalizes understanding of instructions and care plan provided today and agrees to view in MyChart. Active MyChart status and patient understanding of how to access instructions and care plan via MyChart confirmed with patient.     Delsa Sale RN BSN CCM [place]  [facility], [facility] Nurse Care Coordinator  Direct Dial: [phone] Website: [email or web address]

## 2024-02-20 DIAGNOSIS — Z992 Dependence on renal dialysis: Secondary | ICD-10-CM | POA: Diagnosis not present

## 2024-02-20 DIAGNOSIS — N2581 Secondary hyperparathyroidism of renal origin: Secondary | ICD-10-CM | POA: Diagnosis not present

## 2024-02-20 DIAGNOSIS — D689 Coagulation defect, unspecified: Secondary | ICD-10-CM | POA: Diagnosis not present

## 2024-02-20 DIAGNOSIS — D509 Iron deficiency anemia, unspecified: Secondary | ICD-10-CM | POA: Diagnosis not present

## 2024-02-20 DIAGNOSIS — E875 Hyperkalemia: Secondary | ICD-10-CM | POA: Diagnosis not present

## 2024-02-20 DIAGNOSIS — D631 Anemia in chronic kidney disease: Secondary | ICD-10-CM | POA: Diagnosis not present

## 2024-02-20 DIAGNOSIS — N186 End stage renal disease: Secondary | ICD-10-CM | POA: Diagnosis not present

## 2024-02-22 DIAGNOSIS — S42201D Unspecified fracture of upper end of right humerus, subsequent encounter for fracture with routine healing: Secondary | ICD-10-CM | POA: Insufficient documentation

## 2024-02-22 DIAGNOSIS — M545 Low back pain, unspecified: Secondary | ICD-10-CM

## 2024-02-22 HISTORY — DX: Low back pain, unspecified: M54.50

## 2024-02-22 NOTE — Assessment & Plan Note (Signed)
 She is to f/u with Orthopedics this week. She is not wearing her sling due to causing more pain.

## 2024-02-22 NOTE — Assessment & Plan Note (Signed)
 Her pain has improved since she called last week. I did provide her with a limited supply of tylenol with codeine for moderate pain. She is aware to not take more than 4000 mg of tylenol per day.

## 2024-02-23 DIAGNOSIS — E875 Hyperkalemia: Secondary | ICD-10-CM | POA: Diagnosis not present

## 2024-02-23 DIAGNOSIS — N186 End stage renal disease: Secondary | ICD-10-CM | POA: Diagnosis not present

## 2024-02-23 DIAGNOSIS — Z992 Dependence on renal dialysis: Secondary | ICD-10-CM | POA: Diagnosis not present

## 2024-02-23 DIAGNOSIS — D689 Coagulation defect, unspecified: Secondary | ICD-10-CM | POA: Diagnosis not present

## 2024-02-23 DIAGNOSIS — N2581 Secondary hyperparathyroidism of renal origin: Secondary | ICD-10-CM | POA: Diagnosis not present

## 2024-02-23 DIAGNOSIS — D631 Anemia in chronic kidney disease: Secondary | ICD-10-CM | POA: Diagnosis not present

## 2024-02-23 DIAGNOSIS — D509 Iron deficiency anemia, unspecified: Secondary | ICD-10-CM | POA: Diagnosis not present

## 2024-02-25 DIAGNOSIS — D689 Coagulation defect, unspecified: Secondary | ICD-10-CM | POA: Diagnosis not present

## 2024-02-25 DIAGNOSIS — E875 Hyperkalemia: Secondary | ICD-10-CM | POA: Diagnosis not present

## 2024-02-25 DIAGNOSIS — D509 Iron deficiency anemia, unspecified: Secondary | ICD-10-CM | POA: Diagnosis not present

## 2024-02-25 DIAGNOSIS — D631 Anemia in chronic kidney disease: Secondary | ICD-10-CM | POA: Diagnosis not present

## 2024-02-25 DIAGNOSIS — N186 End stage renal disease: Secondary | ICD-10-CM | POA: Diagnosis not present

## 2024-02-25 DIAGNOSIS — N2581 Secondary hyperparathyroidism of renal origin: Secondary | ICD-10-CM | POA: Diagnosis not present

## 2024-02-25 DIAGNOSIS — Z992 Dependence on renal dialysis: Secondary | ICD-10-CM | POA: Diagnosis not present

## 2024-02-27 DIAGNOSIS — N186 End stage renal disease: Secondary | ICD-10-CM | POA: Diagnosis not present

## 2024-02-27 DIAGNOSIS — D689 Coagulation defect, unspecified: Secondary | ICD-10-CM | POA: Diagnosis not present

## 2024-02-27 DIAGNOSIS — N2581 Secondary hyperparathyroidism of renal origin: Secondary | ICD-10-CM | POA: Diagnosis not present

## 2024-02-27 DIAGNOSIS — E875 Hyperkalemia: Secondary | ICD-10-CM | POA: Diagnosis not present

## 2024-02-27 DIAGNOSIS — D631 Anemia in chronic kidney disease: Secondary | ICD-10-CM | POA: Diagnosis not present

## 2024-02-27 DIAGNOSIS — D509 Iron deficiency anemia, unspecified: Secondary | ICD-10-CM | POA: Diagnosis not present

## 2024-02-27 DIAGNOSIS — Z992 Dependence on renal dialysis: Secondary | ICD-10-CM | POA: Diagnosis not present

## 2024-02-27 DIAGNOSIS — I129 Hypertensive chronic kidney disease with stage 1 through stage 4 chronic kidney disease, or unspecified chronic kidney disease: Secondary | ICD-10-CM | POA: Diagnosis not present

## 2024-02-28 DIAGNOSIS — G4733 Obstructive sleep apnea (adult) (pediatric): Secondary | ICD-10-CM | POA: Diagnosis not present

## 2024-02-28 DIAGNOSIS — I509 Heart failure, unspecified: Secondary | ICD-10-CM | POA: Diagnosis not present

## 2024-02-28 DIAGNOSIS — N186 End stage renal disease: Secondary | ICD-10-CM | POA: Diagnosis not present

## 2024-02-28 DIAGNOSIS — R531 Weakness: Secondary | ICD-10-CM | POA: Diagnosis not present

## 2024-02-28 DIAGNOSIS — Z992 Dependence on renal dialysis: Secondary | ICD-10-CM | POA: Diagnosis not present

## 2024-03-01 DIAGNOSIS — N2581 Secondary hyperparathyroidism of renal origin: Secondary | ICD-10-CM | POA: Diagnosis not present

## 2024-03-01 DIAGNOSIS — N186 End stage renal disease: Secondary | ICD-10-CM | POA: Diagnosis not present

## 2024-03-01 DIAGNOSIS — E875 Hyperkalemia: Secondary | ICD-10-CM | POA: Diagnosis not present

## 2024-03-01 DIAGNOSIS — D631 Anemia in chronic kidney disease: Secondary | ICD-10-CM | POA: Diagnosis not present

## 2024-03-01 DIAGNOSIS — D509 Iron deficiency anemia, unspecified: Secondary | ICD-10-CM | POA: Diagnosis not present

## 2024-03-01 DIAGNOSIS — D689 Coagulation defect, unspecified: Secondary | ICD-10-CM | POA: Diagnosis not present

## 2024-03-01 DIAGNOSIS — Z992 Dependence on renal dialysis: Secondary | ICD-10-CM | POA: Diagnosis not present

## 2024-03-02 ENCOUNTER — Ambulatory Visit: Payer: Self-pay

## 2024-03-03 DIAGNOSIS — N2581 Secondary hyperparathyroidism of renal origin: Secondary | ICD-10-CM | POA: Diagnosis not present

## 2024-03-03 DIAGNOSIS — D689 Coagulation defect, unspecified: Secondary | ICD-10-CM | POA: Diagnosis not present

## 2024-03-03 DIAGNOSIS — N186 End stage renal disease: Secondary | ICD-10-CM | POA: Diagnosis not present

## 2024-03-03 DIAGNOSIS — E875 Hyperkalemia: Secondary | ICD-10-CM | POA: Diagnosis not present

## 2024-03-03 DIAGNOSIS — D509 Iron deficiency anemia, unspecified: Secondary | ICD-10-CM | POA: Diagnosis not present

## 2024-03-03 DIAGNOSIS — Z992 Dependence on renal dialysis: Secondary | ICD-10-CM | POA: Diagnosis not present

## 2024-03-03 DIAGNOSIS — D631 Anemia in chronic kidney disease: Secondary | ICD-10-CM | POA: Diagnosis not present

## 2024-03-03 NOTE — Patient Outreach (Signed)
 Care Coordination   Follow Up Visit Note   03/02/2024 Name: Whitney Walker MRN: [id number] DOB: 1956-07-30  Whitney Walker is a 68 y.o. year old female who sees Arnette Felts, FNP for primary care. I spoke with  [name] by phone today.  What matters to the patients health and wellness today?  Patient would like to ask her doctor about checking her stool for blood. She would like to receive in home PT to help with her shoulder pain and decreased ROM.     Goals Addressed             This Visit's Progress    COMPLETED: Follow-up with PCP       Care Management Goals: Patient will keep follow-up appointment with PCP on 02/09/24 Patient will reach out to Care Management team with any resource or care management needs     To avoid having more falls   On track    Care Coordination Interventions: Assessed for falls since last encounter Assessed patients knowledge of fall risk prevention secondary to previously provided education Determined patient has yet to receive in home PT, per [facility] for Dr. Thad Ranger, it is noted that [facility] is at full capacity, they do not have a PT available at this time  Discussed patient feels her foot pain has improved, however her shoulder pain has worsened with decreased ROM resulting in self-care deficit  Instructed patient to contact her health plan to determine other in network home health agencies for whom her Orthopedic MD can send a new referral, she verbalizes understanding and is agreeable  Discussed plans with patient for ongoing care coordination follow up and provided patient with direct contact information for nurse care coordinator Scheduled nurse follow up call with patient for 03/16/24 @12 :30 PM        To optimize management of anemia       Care Coordination Interventions: Evaluation of current treatment plan related to anemia  and patient's adherence to plan as established by  provider Discussed patient concerns related to her recent drop in hemoglobin detected by labs drawn at the kidney center, patient states her Hgb dropped to the 9 range from previous 12 range Discussed patient often gets light rinse back following treatment so she doesn't get fluid overload from saline Educated patient regarding potential for Hgb dropping over time from light rinse back Educated patient regarding ESRD and low Hgb due to having low erythropoietin Educated patient regarding hemoccult stool checks to rule out slow GI bleed as a precaution and instructed patient to discuss this with her dialysis nurse during next treatment and patient verbalizes understanding        Interventions Today    Flowsheet Row Most Recent Value  Chronic Disease   Chronic disease during today's visit Chronic Kidney Disease/End Stage Renal Disease (ESRD), Other  [s/p fall with injuries to foot and closed fracture to humerus,  Anemia]  General Interventions   General Interventions Discussed/Reviewed General Interventions Discussed, General Interventions Reviewed, Labs, Doctor Visits, Durable Medical Equipment (DME)  Doctor Visits Discussed/Reviewed Doctor Visits Discussed, Doctor Visits Reviewed, PCP, Specialist  Durable Medical Equipment (DME) Dan Humphreys  Exercise Interventions   Exercise Discussed/Reviewed Physical Activity, Exercise Reviewed, Exercise Discussed, Assistive device use and maintanence  Physical Activity Discussed/Reviewed Physical Activity Discussed, Physical Activity Reviewed, Types of exercise  Education Interventions   Education Provided Provided Education  Provided Verbal Education On Labs, Medication, Exercise, When to see the doctor  Pharmacy  Interventions   Pharmacy Dicussed/Reviewed Pharmacy Topics Discussed, Pharmacy Topics Reviewed, Medications and their functions  Safety Interventions   Safety Discussed/Reviewed Home Safety, Fall Risk, Safety Reviewed, Safety Discussed  Home  Safety Assistive Devices, Contact provider for referral to PT/OT          SDOH assessments and interventions completed:  Yes  SDOH Interventions Today    Flowsheet Row Most Recent Value  SDOH Interventions   Food Insecurity Interventions Intervention Not Indicated  Housing Interventions Intervention Not Indicated  Utilities Interventions Intervention Not Indicated        Care Coordination Interventions:  Yes, provided   Follow up plan: Follow up call scheduled for 03/16/24 @12 :30 PM    Encounter Outcome:  Patient Visit Completed

## 2024-03-03 NOTE — Patient Instructions (Signed)
 Visit Information  Thank you for taking time to visit with me today. Please don't hesitate to contact me if I can be of assistance to you.   Following are the goals we discussed today:   Goals Addressed             This Visit's Progress    COMPLETED: Follow-up with PCP       Care Management Goals: Patient will keep follow-up appointment with PCP on 02/09/24 Patient will reach out to Care Management team with any resource or care management needs     To avoid having more falls   On track    Care Coordination Interventions: Assessed for falls since last encounter Assessed patients knowledge of fall risk prevention secondary to previously provided education Determined patient has yet to receive in home PT, per [facility] for Dr. Thad Ranger, it is noted that [facility] is at full capacity, they do not have a PT available at this time  Discussed patient feels her foot pain has improved, however her shoulder pain has worsened with decreased ROM resulting in self-care deficit  Instructed patient to contact her health plan to determine other in network home health agencies for whom her Orthopedic MD can send a new referral, she verbalizes understanding and is agreeable  Discussed plans with patient for ongoing care coordination follow up and provided patient with direct contact information for nurse care coordinator Scheduled nurse follow up call with patient for 03/16/24 @12 :30 PM        To optimize management of anemia       Care Coordination Interventions: Evaluation of current treatment plan related to anemia  and patient's adherence to plan as established by provider Discussed patient concerns related to her recent drop in hemoglobin detected by labs drawn at the kidney center, patient states her Hgb dropped to the 9 range from previous 12 range Discussed patient often gets light rinse back following treatment so she doesn't get fluid overload from  saline Educated patient regarding potential for Hgb dropping over time from light rinse back Educated patient regarding ESRD and low Hgb due to having low erythropoietin Educated patient regarding hemoccult stool checks to rule out slow GI bleed as a precaution and instructed patient to discuss this with her dialysis nurse during next treatment and patient verbalizes understanding            Our next appointment is by telephone on 03/16/24 at 12:30 PM  Please call the care guide team at [phone] if you need to cancel or reschedule your appointment.   If you are experiencing a Mental Health or Behavioral Health Crisis or need someone to talk to, please call 1-800-273-TALK (toll free, 24 hour hotline)  Patient verbalizes understanding of instructions and care plan provided today and agrees to view in MyChart. Active MyChart status and patient understanding of how to access instructions and care plan via MyChart confirmed with patient.     Delsa Sale RN BSN CCM [place]  [facility] Nurse Care Coordinator  Direct Dial: [phone] Website: [email or web address]

## 2024-03-05 DIAGNOSIS — N2581 Secondary hyperparathyroidism of renal origin: Secondary | ICD-10-CM | POA: Diagnosis not present

## 2024-03-05 DIAGNOSIS — N186 End stage renal disease: Secondary | ICD-10-CM | POA: Diagnosis not present

## 2024-03-05 DIAGNOSIS — E875 Hyperkalemia: Secondary | ICD-10-CM | POA: Diagnosis not present

## 2024-03-05 DIAGNOSIS — Z992 Dependence on renal dialysis: Secondary | ICD-10-CM | POA: Diagnosis not present

## 2024-03-05 DIAGNOSIS — D509 Iron deficiency anemia, unspecified: Secondary | ICD-10-CM | POA: Diagnosis not present

## 2024-03-05 DIAGNOSIS — D631 Anemia in chronic kidney disease: Secondary | ICD-10-CM | POA: Diagnosis not present

## 2024-03-05 DIAGNOSIS — D689 Coagulation defect, unspecified: Secondary | ICD-10-CM | POA: Diagnosis not present

## 2024-03-08 DIAGNOSIS — N186 End stage renal disease: Secondary | ICD-10-CM | POA: Diagnosis not present

## 2024-03-08 DIAGNOSIS — D509 Iron deficiency anemia, unspecified: Secondary | ICD-10-CM | POA: Diagnosis not present

## 2024-03-08 DIAGNOSIS — E875 Hyperkalemia: Secondary | ICD-10-CM | POA: Diagnosis not present

## 2024-03-08 DIAGNOSIS — D631 Anemia in chronic kidney disease: Secondary | ICD-10-CM | POA: Diagnosis not present

## 2024-03-08 DIAGNOSIS — Z992 Dependence on renal dialysis: Secondary | ICD-10-CM | POA: Diagnosis not present

## 2024-03-08 DIAGNOSIS — N2581 Secondary hyperparathyroidism of renal origin: Secondary | ICD-10-CM | POA: Diagnosis not present

## 2024-03-08 DIAGNOSIS — D689 Coagulation defect, unspecified: Secondary | ICD-10-CM | POA: Diagnosis not present

## 2024-03-10 DIAGNOSIS — D631 Anemia in chronic kidney disease: Secondary | ICD-10-CM | POA: Diagnosis not present

## 2024-03-10 DIAGNOSIS — N186 End stage renal disease: Secondary | ICD-10-CM | POA: Diagnosis not present

## 2024-03-10 DIAGNOSIS — E875 Hyperkalemia: Secondary | ICD-10-CM | POA: Diagnosis not present

## 2024-03-10 DIAGNOSIS — Z992 Dependence on renal dialysis: Secondary | ICD-10-CM | POA: Diagnosis not present

## 2024-03-10 DIAGNOSIS — N2581 Secondary hyperparathyroidism of renal origin: Secondary | ICD-10-CM | POA: Diagnosis not present

## 2024-03-10 DIAGNOSIS — D689 Coagulation defect, unspecified: Secondary | ICD-10-CM | POA: Diagnosis not present

## 2024-03-10 DIAGNOSIS — D509 Iron deficiency anemia, unspecified: Secondary | ICD-10-CM | POA: Diagnosis not present

## 2024-03-11 ENCOUNTER — Ambulatory Visit: Payer: 59 | Admitting: Pulmonary Disease

## 2024-03-11 DIAGNOSIS — S93401A Sprain of unspecified ligament of right ankle, initial encounter: Secondary | ICD-10-CM | POA: Diagnosis not present

## 2024-03-12 DIAGNOSIS — E875 Hyperkalemia: Secondary | ICD-10-CM | POA: Diagnosis not present

## 2024-03-12 DIAGNOSIS — N2581 Secondary hyperparathyroidism of renal origin: Secondary | ICD-10-CM | POA: Diagnosis not present

## 2024-03-12 DIAGNOSIS — D689 Coagulation defect, unspecified: Secondary | ICD-10-CM | POA: Diagnosis not present

## 2024-03-12 DIAGNOSIS — D631 Anemia in chronic kidney disease: Secondary | ICD-10-CM | POA: Diagnosis not present

## 2024-03-12 DIAGNOSIS — Z992 Dependence on renal dialysis: Secondary | ICD-10-CM | POA: Diagnosis not present

## 2024-03-12 DIAGNOSIS — D509 Iron deficiency anemia, unspecified: Secondary | ICD-10-CM | POA: Diagnosis not present

## 2024-03-12 DIAGNOSIS — N186 End stage renal disease: Secondary | ICD-10-CM | POA: Diagnosis not present

## 2024-03-15 ENCOUNTER — Other Ambulatory Visit: Payer: Self-pay | Admitting: Nurse Practitioner

## 2024-03-15 DIAGNOSIS — D689 Coagulation defect, unspecified: Secondary | ICD-10-CM | POA: Diagnosis not present

## 2024-03-15 DIAGNOSIS — D631 Anemia in chronic kidney disease: Secondary | ICD-10-CM | POA: Diagnosis not present

## 2024-03-15 DIAGNOSIS — E875 Hyperkalemia: Secondary | ICD-10-CM | POA: Diagnosis not present

## 2024-03-15 DIAGNOSIS — Z992 Dependence on renal dialysis: Secondary | ICD-10-CM | POA: Diagnosis not present

## 2024-03-15 DIAGNOSIS — N2581 Secondary hyperparathyroidism of renal origin: Secondary | ICD-10-CM | POA: Diagnosis not present

## 2024-03-15 DIAGNOSIS — D509 Iron deficiency anemia, unspecified: Secondary | ICD-10-CM | POA: Diagnosis not present

## 2024-03-15 DIAGNOSIS — N186 End stage renal disease: Secondary | ICD-10-CM | POA: Diagnosis not present

## 2024-03-16 ENCOUNTER — Ambulatory Visit: Payer: Self-pay

## 2024-03-16 NOTE — Patient Instructions (Signed)
 Visit Information  Thank you for taking time to visit with me today. Please don't hesitate to contact me if I can be of assistance to you.   Following are the goals we discussed today:   Goals Addressed             This Visit's Progress    To avoid having more falls   On track    Care Coordination Interventions: Assessed for falls since last encounter Assessed patients knowledge of fall risk prevention secondary to previously provided education Determined patient completed her orthopedic follow up, she will start outpatient PT on 03/30/24 at [facility] Reviewed fall precautions with patient  Discussed plans with patient for ongoing care coordination follow up and provided patient with direct contact information for nurse care coordinator Reviewed and discussed next PCP follow up for AWV Scheduled nurse follow up call with patient for 05/06/24 @12 :00 PM     COMPLETED: To optimize management of anemia       Care Coordination Interventions: Evaluation of current treatment plan related to anemia  and patient's adherence to plan as established by provider Discussed with patient her hemoglobin has increased to 10.2 and continues to be monitored by the kidney center Determined patient was not provided stool cards by her kidney doctor at this time Instructed patient to keep her doctor informed of new symptoms or concerns and she verbalizes understanding           Our next appointment is by telephone on 05/06/24 at 12:00 PM  Please call the care guide team at [phone] if you need to cancel or reschedule your appointment.   If you are experiencing a Mental Health or Behavioral Health Crisis or need someone to talk to, please call 1-800-273-TALK (toll free, 24 hour hotline)  Patient verbalizes understanding of instructions and care plan provided today and agrees to view in MyChart. Active MyChart status and patient understanding of how to access instructions and care  plan via MyChart confirmed with patient.     Delsa Sale RN BSN CCM [place]  [facility], [facility] Nurse Care Coordinator  Direct Dial: [phone] Website: [email or web address]

## 2024-03-16 NOTE — Patient Outreach (Signed)
 Care Coordination   Follow Up Visit Note   03/16/2024 Name: Whitney Walker MRN: [id number] DOB: October 13, 1956  Whitney Walker is a 68 y.o. year old female who sees Arnette Felts, FNP for primary care. I spoke with  [name] by phone today.  What matters to the patients health and wellness today?  Patient would like to establish with outpatient PT.     Goals Addressed             This Visit's Progress    To avoid having more falls   On track    Care Coordination Interventions: Assessed for falls since last encounter Assessed patients knowledge of fall risk prevention secondary to previously provided education Determined patient completed her orthopedic follow up, she will start outpatient PT on 03/30/24 at [facility] Reviewed fall precautions with patient  Discussed plans with patient for ongoing care coordination follow up and provided patient with direct contact information for nurse care coordinator Reviewed and discussed next PCP follow up for AWV Scheduled nurse follow up call with patient for 05/06/24 @12 :00 PM     COMPLETED: To optimize management of anemia       Care Coordination Interventions: Evaluation of current treatment plan related to anemia  and patient's adherence to plan as established by provider Discussed with patient her hemoglobin has increased to 10.2 and continues to be monitored by the kidney center Determined patient was not provided stool cards by her kidney doctor at this time Instructed patient to keep her doctor informed of new symptoms or concerns and she verbalizes understanding       Interventions Today    Flowsheet Row Most Recent Value  Chronic Disease   Chronic disease during today's visit Other  [shoulder/foot fracture,  anemia]  General Interventions   General Interventions Discussed/Reviewed General Interventions Discussed, General Interventions Reviewed, Doctor Visits  Doctor Visits Discussed/Reviewed  Doctor Visits Reviewed, Doctor Visits Discussed, PCP, Specialist, Annual Wellness Visits  Exercise Interventions   Exercise Discussed/Reviewed Physical Activity  Physical Activity Discussed/Reviewed Physical Activity Reviewed, Physical Activity Discussed, Home Exercise Program (HEP)  Education Interventions   Education Provided Provided Education  Provided Verbal Education On When to see the doctor, Exercise, Labs, Medication  Labs Reviewed --  [hgb]  Pharmacy Interventions   Pharmacy Dicussed/Reviewed Pharmacy Topics Reviewed, Pharmacy Topics Discussed  Safety Interventions   Safety Discussed/Reviewed Safety Reviewed, Safety Discussed  Home Safety Assistive Devices          SDOH assessments and interventions completed:  No     Care Coordination Interventions:  Yes, provided   Follow up plan: Follow up call scheduled for 05/06/24 @12 :00 PM    Encounter Outcome:  Patient Visit Completed

## 2024-03-17 DIAGNOSIS — D631 Anemia in chronic kidney disease: Secondary | ICD-10-CM | POA: Diagnosis not present

## 2024-03-17 DIAGNOSIS — N186 End stage renal disease: Secondary | ICD-10-CM | POA: Diagnosis not present

## 2024-03-17 DIAGNOSIS — E875 Hyperkalemia: Secondary | ICD-10-CM | POA: Diagnosis not present

## 2024-03-17 DIAGNOSIS — N2581 Secondary hyperparathyroidism of renal origin: Secondary | ICD-10-CM | POA: Diagnosis not present

## 2024-03-17 DIAGNOSIS — D689 Coagulation defect, unspecified: Secondary | ICD-10-CM | POA: Diagnosis not present

## 2024-03-17 DIAGNOSIS — Z992 Dependence on renal dialysis: Secondary | ICD-10-CM | POA: Diagnosis not present

## 2024-03-17 DIAGNOSIS — D509 Iron deficiency anemia, unspecified: Secondary | ICD-10-CM | POA: Diagnosis not present

## 2024-03-19 DIAGNOSIS — D631 Anemia in chronic kidney disease: Secondary | ICD-10-CM | POA: Diagnosis not present

## 2024-03-19 DIAGNOSIS — D509 Iron deficiency anemia, unspecified: Secondary | ICD-10-CM | POA: Diagnosis not present

## 2024-03-19 DIAGNOSIS — N2581 Secondary hyperparathyroidism of renal origin: Secondary | ICD-10-CM | POA: Diagnosis not present

## 2024-03-19 DIAGNOSIS — D689 Coagulation defect, unspecified: Secondary | ICD-10-CM | POA: Diagnosis not present

## 2024-03-19 DIAGNOSIS — E875 Hyperkalemia: Secondary | ICD-10-CM | POA: Diagnosis not present

## 2024-03-19 DIAGNOSIS — Z992 Dependence on renal dialysis: Secondary | ICD-10-CM | POA: Diagnosis not present

## 2024-03-19 DIAGNOSIS — N186 End stage renal disease: Secondary | ICD-10-CM | POA: Diagnosis not present

## 2024-03-22 DIAGNOSIS — Z992 Dependence on renal dialysis: Secondary | ICD-10-CM | POA: Diagnosis not present

## 2024-03-22 DIAGNOSIS — N186 End stage renal disease: Secondary | ICD-10-CM | POA: Diagnosis not present

## 2024-03-24 DIAGNOSIS — D631 Anemia in chronic kidney disease: Secondary | ICD-10-CM | POA: Diagnosis not present

## 2024-03-24 DIAGNOSIS — N186 End stage renal disease: Secondary | ICD-10-CM | POA: Diagnosis not present

## 2024-03-24 DIAGNOSIS — D509 Iron deficiency anemia, unspecified: Secondary | ICD-10-CM | POA: Diagnosis not present

## 2024-03-24 DIAGNOSIS — N2581 Secondary hyperparathyroidism of renal origin: Secondary | ICD-10-CM | POA: Diagnosis not present

## 2024-03-24 DIAGNOSIS — E875 Hyperkalemia: Secondary | ICD-10-CM | POA: Diagnosis not present

## 2024-03-24 DIAGNOSIS — Z992 Dependence on renal dialysis: Secondary | ICD-10-CM | POA: Diagnosis not present

## 2024-03-24 DIAGNOSIS — D689 Coagulation defect, unspecified: Secondary | ICD-10-CM | POA: Diagnosis not present

## 2024-03-26 DIAGNOSIS — N2581 Secondary hyperparathyroidism of renal origin: Secondary | ICD-10-CM | POA: Diagnosis not present

## 2024-03-26 DIAGNOSIS — N186 End stage renal disease: Secondary | ICD-10-CM | POA: Diagnosis not present

## 2024-03-26 DIAGNOSIS — D509 Iron deficiency anemia, unspecified: Secondary | ICD-10-CM | POA: Diagnosis not present

## 2024-03-26 DIAGNOSIS — E875 Hyperkalemia: Secondary | ICD-10-CM | POA: Diagnosis not present

## 2024-03-26 DIAGNOSIS — Z992 Dependence on renal dialysis: Secondary | ICD-10-CM | POA: Diagnosis not present

## 2024-03-26 DIAGNOSIS — D689 Coagulation defect, unspecified: Secondary | ICD-10-CM | POA: Diagnosis not present

## 2024-03-26 DIAGNOSIS — D631 Anemia in chronic kidney disease: Secondary | ICD-10-CM | POA: Diagnosis not present

## 2024-03-29 DIAGNOSIS — D689 Coagulation defect, unspecified: Secondary | ICD-10-CM | POA: Diagnosis not present

## 2024-03-29 DIAGNOSIS — E875 Hyperkalemia: Secondary | ICD-10-CM | POA: Diagnosis not present

## 2024-03-29 DIAGNOSIS — N186 End stage renal disease: Secondary | ICD-10-CM | POA: Diagnosis not present

## 2024-03-29 DIAGNOSIS — I129 Hypertensive chronic kidney disease with stage 1 through stage 4 chronic kidney disease, or unspecified chronic kidney disease: Secondary | ICD-10-CM | POA: Diagnosis not present

## 2024-03-29 DIAGNOSIS — Z992 Dependence on renal dialysis: Secondary | ICD-10-CM | POA: Diagnosis not present

## 2024-03-29 DIAGNOSIS — D509 Iron deficiency anemia, unspecified: Secondary | ICD-10-CM | POA: Diagnosis not present

## 2024-03-29 DIAGNOSIS — N2581 Secondary hyperparathyroidism of renal origin: Secondary | ICD-10-CM | POA: Diagnosis not present

## 2024-03-29 DIAGNOSIS — D631 Anemia in chronic kidney disease: Secondary | ICD-10-CM | POA: Diagnosis not present

## 2024-03-30 ENCOUNTER — Encounter: Payer: 59 | Admitting: Nurse Practitioner

## 2024-03-30 ENCOUNTER — Ambulatory Visit: Payer: 59 | Admitting: Nurse Practitioner

## 2024-03-30 ENCOUNTER — Encounter: Payer: Self-pay | Admitting: Nurse Practitioner

## 2024-03-30 VITALS — BP 120/80 | HR 98 | Temp 98.5°F | Ht 61.0 in | Wt 198.8 lb

## 2024-03-30 DIAGNOSIS — M25571 Pain in right ankle and joints of right foot: Secondary | ICD-10-CM | POA: Diagnosis not present

## 2024-03-30 DIAGNOSIS — Z0000 Encounter for general adult medical examination without abnormal findings: Secondary | ICD-10-CM

## 2024-03-30 DIAGNOSIS — M25511 Pain in right shoulder: Secondary | ICD-10-CM | POA: Diagnosis not present

## 2024-03-30 NOTE — Progress Notes (Signed)
 Subjective:   Whitney Walker is a 68 y.o. female who presents for Medicare Annual (Subsequent) preventive examination.  Visit Complete: In person  Patient Medicare AWV questionnaire was completed by the patient on 03/30/2024; I have confirmed that all information answered by patient is correct and no changes since this date.        Objective:    Today's Vitals   03/30/24 1114  BP: 120/80  Pulse: 98  Temp: 98.5 F (36.9 C)  TempSrc: Oral  Weight: 198 lb 12.8 oz (90.2 kg)  Height: 5\' 1"  (1.549 m)  PainSc: 5   PainLoc: Shoulder   Body mass index is 37.56 kg/m.     03/30/2024   11:44 AM 12/01/2023    5:43 PM 03/20/2023    3:19 PM 12/10/2022   10:57 AM 05/16/2022    5:04 PM 05/16/2022    2:07 PM 05/01/2022    7:57 AM  Advanced Directives  Does Patient Have a Medical Advance Directive? No No No No No No No  Would patient like information on creating a medical advance directive? No - Patient declined No - Patient declined No - Patient declined No - Patient declined No - Patient declined No - Patient declined     Current Medications (verified) Outpatient Encounter Medications as of 03/30/2024  Medication Sig   acetaminophen-codeine (TYLENOL #3) 300-30 MG tablet Take 1 tablet by mouth every 8 (eight) hours as needed for moderate pain (pain score 4-6).   albuterol (VENTOLIN HFA) 108 (90 Base) MCG/ACT inhaler Inhale 2 puffs into the lungs every 6 (six) hours as needed for wheezing or shortness of breath.   ELIQUIS 5 MG TABS tablet TAKE 1 TABLET (5 MG TOTAL) BY MOUTH 2 (TWO) TIMES DAILY.   meclizine (ANTIVERT) 12.5 MG tablet Take 1 tablet (12.5 mg total) by mouth 3 (three) times daily as needed for dizziness. (Patient taking differently: Take 12.5 mg by mouth as needed for dizziness.)   midodrine (PROAMATINE) 10 MG tablet Take 0.5 tablets (5 mg total) by mouth 2 (two) times daily with a meal.   multivitamin (RENA-VIT) TABS tablet Take 1 tablet by mouth at bedtime.   omeprazole  (PRILOSEC) 20 MG capsule TAKE 1 CAPSULE (20 MG TOTAL) BY MOUTH DAILY.   sevelamer carbonate (RENVELA) 800 MG tablet Take 2-4 tablets by mouth See admin instructions. Take 4 tablets with breakfast, lunch, and dinner. Take 2 tablets with a snack   sodium zirconium cyclosilicate (LOKELMA) 10 g PACK packet Take 10 g by mouth daily.   Tiotropium Bromide-Olodaterol (STIOLTO RESPIMAT) 2.5-2.5 MCG/ACT AERS Inhale 2 puffs into the lungs daily.   vitamin E 200 UNIT capsule Take 200 Units by mouth daily.   atorvastatin (LIPITOR) 20 MG tablet Take 1 tablet (20 mg total) by mouth daily.   No facility-administered encounter medications on file as of 03/30/2024.    Allergies (verified) Difelikefalin, Carnosine, Gadolinium derivatives, Iohexol, Iodinated contrast media, Iodine, and Naltrexone   History: Past Medical History:  Diagnosis Date   Anemia    Anxiety    CHF (congestive heart failure) (HCC)    Colon cancer (HCC)    treatment surgery   Complication of anesthesia    after first C- [place] "couldnt walk after", patient denies having a spinal   COPD (chronic obstructive pulmonary disease) (HCC)    Coronary artery disease    Depression    DVT (deep venous thrombosis) (HCC)    Encounter for annual health examination 08/05/2023   ESRD on hemodialysis (  HCC)    Hemo: MWF   History of blood transfusion 04/2018   Hypertension    07/07/18- no longer takes BP medications   Leucocytosis 04/28/2012   Meningitis    Pain in limb 07/30/2013   PE (pulmonary embolism)    Peripheral vascular disease (HCC)    Primary hypertension 11/15/2013   Restless legs    Shortness of breath    with exertion   Sleep apnea    SOB (shortness of breath) 03/03/2019   Vertigo    Past Surgical History:  Procedure Laterality Date   ABDOMINAL AORTAGRAM N/A 07/26/2013   Procedure: ABDOMINAL Ronny Flurry;  Surgeon: Fransisco Hertz, MD;  Location: [facility] CATH LAB;  Service: Cardiovascular;  Laterality: N/A;   ABDOMINAL HYSTERECTOMY      AV FISTULA PLACEMENT Left 05/05/2018   Procedure: ARTERIOVENOUS (AV) FISTULA CREATION BRACHIOCEPHALIC;  Surgeon: Maeola Harman, MD;  Location: [facility] OR;  Service: Vascular;  Laterality: Left;   AV FISTULA PLACEMENT Left 07/16/2018   Procedure: ARTERIOVENOUS FISTULA CREATION;  Surgeon: Maeola Harman, MD;  Location: [facility] OR;  Service: Vascular;  Laterality: Left;   BASCILIC VEIN TRANSPOSITION Left 09/03/2018   Procedure: BASILIC VEIN TRANSPOSITION SECOND STAGE;  Surgeon: Maeola Harman, MD;  Location: [facility] OR;  Service: Vascular;  Laterality: Left;   BREAST BIOPSY Left    CESAREAN SECTION     X 3 1974-1977   CHOLECYSTECTOMY     CHOLECYSTECTOMY  1980/s   COLECTOMY  2010   DIVERTICULOSIS SURGERY-2002  2012   FEMORAL-POPLITEAL BYPASS GRAFT Left 08/13/2013   Procedure: BYPASS GRAFT FEMORAL-POPLITEAL ARTERY WITH NON-REVERSED SAPHANEOUS VEIN; ULTRASOUND GUIDED;  Surgeon: Pryor Ochoa, MD;  Location: [facility] OR;  Service: Vascular;  Laterality: Left;   FLEXIBLE SIGMOIDOSCOPY N/A 12/10/2022   Procedure: FLEXIBLE SIGMOIDOSCOPY;  Surgeon: Jeani Hawking, MD;  Location: [facility];  Service: Gastroenterology;  Laterality: N/A;   INSERTION OF DIALYSIS CATHETER Right 04/30/2018   Procedure: INSERTION OF Right Internal Jugular DIALYSIS CATHETER;  Surgeon: Nada Libman, MD;  Location: [facility] OR;  Service: Vascular;  Laterality: Right;   IR FLUORO GUIDE CV LINE LEFT  05/01/2022   IR FLUORO GUIDE CV LINE LEFT  05/21/2022   IR FLUORO GUIDE CV LINE RIGHT  07/24/2020   IR FLUORO GUIDE CV LINE RIGHT  07/21/2021   IR FLUORO GUIDE CV LINE RIGHT  05/20/2022   IR PTA VENOUS EXCEPT DIALYSIS CIRCUIT  05/21/2022   IR REMOVAL TUN CV CATH W/O FL  07/21/2020   IR REMOVAL TUN CV CATH W/O FL  07/19/2021   IR REMOVAL TUN CV CATH W/O FL  05/17/2022   IR US GUIDE VASC ACCESS LEFT  05/21/2022   IR US GUIDE VASC ACCESS RIGHT  07/24/2020   IR US GUIDE VASC ACCESS RIGHT  07/21/2021   IR US GUIDE VASC ACCESS RIGHT   05/20/2022   IR VENOCAVAGRAM SVC  05/21/2022   LEFT HEART CATH AND CORONARY ANGIOGRAPHY N/A 05/01/2021   Procedure: LEFT HEART CATH AND CORONARY ANGIOGRAPHY;  Surgeon: Yates Decamp, MD;  Location: [facility] INVASIVE CV LAB;  Service: Cardiovascular;  Laterality: N/A;   LEFT HEART CATH AND CORONARY ANGIOGRAPHY N/A 05/02/2022   Procedure: LEFT HEART CATH AND CORONARY ANGIOGRAPHY;  Surgeon: Yates Decamp, MD;  Location: [facility] INVASIVE CV LAB;  Service: Cardiovascular;  Laterality: N/A;   LOWER EXTREMITY ANGIOGRAM Left 07/26/2013   Procedure: LOWER EXTREMITY ANGIOGRAM;  Surgeon: Fransisco Hertz, MD;  Location: [facility] CATH LAB;  Service: Cardiovascular;  Laterality: Left;  Family History  Problem Relation Age of Onset   Cancer Mother 52       breast and bone   Cancer Father 40       prostate   Hypertension Sister    Bleeding Disorder Sister    Hypertension Daughter    Cancer Cousin 20       breast cancer    Congestive Heart Failure Son    Breast cancer Neg Hx    Social History   Socioeconomic History   Marital status: Single    Spouse name: Not on file   Number of children: 4   Years of education: 12th gd   Highest education level: Not on file  Occupational History   Occupation: N/A  Tobacco Use   Smoking status: Some Days    Current packs/day: 0.00    Average packs/day: 0.5 packs/day for 50.0 years (25.0 ttl pk-yrs)    Types: Cigarettes    Start date: 10/13/1972    Last attempt to quit: 10/13/2022    Years since quitting: 1.4   Smokeless tobacco: Former   Tobacco comments:    She is using a vape when she runs out of her cigarettes, 7/27 - down to 1-2 cigs a day. 03/30/24 - less than 1-2 cigs a day  Vaping Use   Vaping status: Every Day   Start date: 06/28/2020   Last attempt to quit: 05/20/2022   Devices: Vuse  Substance and Sexual Activity   Alcohol use: Not Currently    Comment: she used to drink alcohol, quit in 2010; h/o heavy use    Drug use: Not Currently    Types: Oxycodone   Sexual  activity: Not Currently  Other Topics Concern   Not on file  Social History Narrative   2 cups of coffee a day    Social Drivers of Health   Financial Resource Strain: Low Risk  (03/30/2024)   Overall Financial Resource Strain (CARDIA)    Difficulty of Paying Living Expenses: Not hard at all  Food Insecurity: No Food Insecurity (03/30/2024)   Hunger Vital Sign    Worried About Running Out of Food in the Last Year: Never true    Ran Out of Food in the Last Year: Never true  Transportation Needs: No Transportation Needs (03/30/2024)   PRAPARE - Administrator, Civil Service (Medical): No    Lack of Transportation (Non-Medical): No  Physical Activity: Inactive (03/30/2024)   Exercise Vital Sign    Days of Exercise per Week: 0 days    Minutes of Exercise per Session: 0 min  Stress: No Stress Concern Present (03/30/2024)   [employer] of Occupational Health - Occupational Stress Questionnaire    Feeling of Stress : Not at all  Social Connections: Moderately Isolated (03/30/2024)   Social Connection and Isolation Panel [NHANES]    Frequency of Communication with Friends and Family: More than three times a week    Frequency of Social Gatherings with Friends and Family: More than three times a week    Attends Religious Services: 1 to 4 times per year    Active Member of [employer] or Organizations: No    Attends Banker Meetings: Never    Marital Status: Never married    Tobacco Counseling Ready to quit: Yes Counseling given: Yes Tobacco comments: She is using a vape when she runs out of her cigarettes, 7/27 - down to 1-2 cigs a day. 03/30/24 - less than 1-2 cigs a day  Clinical Intake:     Pain Score: 5                   Activities of Daily Living    03/30/2024   11:18 AM 03/30/2024   11:17 AM  In your present state of health, do you have any difficulty performing the following activities:  Hearing?  0  Vision?  0  Difficulty concentrating or making  decisions?  0  Walking or climbing stairs?  1  Dressing or bathing? 1 0  Doing errands, shopping?  0    Patient Care Team: Susanna Epley, FNP as PCP - General (General Practice) Knox Perl, MD as PCP - Cardiology (Cardiology) Sonja Clermont, MD as Consulting Physician (Hematology) [facility], [facility] [address] Skeeter Dukes, RN as [facility] Knox Perl, MD as Consulting Physician (Cardiology) Cindra Cree Bruna Capers, MD as Consulting Physician (Internal Medicine)  Indicate any recent Medical Services you may have received from other than [facility] providers in the past year (date may be approximate).     Assessment:   This is a routine wellness examination for [place].  Hearing/Vision screen No results found.   Goals Addressed               This Visit's Progress     Increase physical activity        To increase physical activity once having PT for ankle and shoulder      Patient Stated (pt-stated)        Patient has no goals        Depression Screen    03/30/2024   11:16 AM 07/29/2023    2:09 PM 04/17/2023    2:39 PM 03/20/2023    3:20 PM 10/08/2022   10:34 AM 07/25/2022    2:15 PM 07/04/2022    9:19 AM  PHQ 2/9 Scores  PHQ - 2 Score 0 2 0 0 0 0 0  PHQ- 9 Score  8         Fall Risk    07/29/2023    2:09 PM 04/17/2023    2:39 PM 03/20/2023    3:19 PM 10/08/2022   10:34 AM 07/25/2022    2:14 PM  Fall Risk   Falls in the past year? 1 1 1  0 1  Comment   lost balance    Number falls in past yr: 1 1 0 0 1  Injury with Fall? 1 0 0 0 0  Risk for fall due to : No Fall Risks  Medication side effect No Fall Risks History of fall(s)  Follow up Falls evaluation completed  Falls prevention discussed;Education provided;Falls evaluation completed Falls evaluation completed Falls evaluation completed    MEDICARE RISK AT HOME:    TIMED UP AND GO:  Was the test performed?  Yes  Length of time to ambulate 10 feet: 20 sec Gait steady and fast without use of  assistive device    Cognitive Function:    03/19/2017    2:29 PM 07/31/2016    2:18 PM 02/05/2016    2:00 PM 05/04/2015    2:34 PM  MMSE - Mini Mental State Exam  Orientation to time 4 5 5 5   Orientation to Place 4 5 5 4   Registration 3 3 3 3   Attention/ Calculation 1 4 4 4   Recall 1 2 2 3   Language- name 2 objects 2 2 2 2   Language- repeat 1 1 1 1   Language- follow 3 step command  3 3 3 2   Language- read & follow direction 1 1 1 1   Write a sentence 1 1 1 1   Copy design 1 1 1 1   Total score 22 28 28 27         03/30/2024   11:18 AM 03/20/2023    3:21 PM 03/14/2022   11:08 AM 06/10/2019    2:27 PM  6CIT Screen  What Year? 0 points 0 points 0 points 0 points  What month? 0 points 0 points 0 points 0 points  What time? 3 points 0 points 0 points 0 points  Count back from 20 0 points 2 points 0 points 0 points  Months in reverse -- 0 points 0 points 0 points  Repeat phrase 0 points 4 points 0 points 0 points  Total Score  6 points 0 points 0 points    Immunizations Immunization History  Administered Date(s) Administered   Fluad Quad(high Dose 65+) 10/04/2019   Hepatitis B, ADULT 09/28/2018, 05/19/2020   Hepb-cpg 04/16/2023   Influenza, High Dose Seasonal PF 12/29/2020, 10/10/2021   Influenza, Quadrivalent, Recombinant, Inj, Pf 10/23/2022   Influenza,inj,Quad PF,6+ Mos 10/04/2019, 10/04/2019   Influenza,inj,quad, With Preservative 09/23/2018   Influenza-Unspecified 10/13/2023   Moderna Sars-Covid-2 Vaccination 03/10/2020, 04/07/2020, 12/22/2020   Pneumococcal Conjugate-13 04/20/2021   Pneumococcal Polysaccharide-23 03/22/2019    TDAP status: Up to date  Flu Vaccine status: Up to date  Pneumococcal vaccine status: Declined,  Education has been provided regarding the importance of this vaccine but patient still declined. Advised may receive this vaccine at local pharmacy or Health Dept. Aware to provide a copy of the vaccination record if obtained from local pharmacy or Health  Dept. Verbalized acceptance and understanding.   Covid-19 vaccine status: Completed vaccines  Qualifies for Shingles Vaccine? Yes   Zostavax completed No   Shingrix Completed?: No.    Education has been provided regarding the importance of this vaccine. Patient has been advised to call insurance company to determine out of pocket expense if they have not yet received this vaccine. Advised may also receive vaccine at local pharmacy or Health Dept. Verbalized acceptance and understanding.  Screening Tests Health Maintenance  Topic Date Due   Colonoscopy  06/05/2022   DEXA SCAN  07/28/2024 (Originally 06/29/2020)   Pneumonia Vaccine 38+ Years old (3 of 3 - PPSV23, PCV20 or PCV21) 03/30/2025 (Originally 03/21/2024)   INFLUENZA VACCINE  07/30/2024   Lung Cancer Screening  08/11/2024   MAMMOGRAM  01/30/2025   Medicare Annual Wellness (AWV)  04/12/2025   Hepatitis C Screening  Completed   HPV VACCINES  Aged Out   Meningococcal B Vaccine  Aged Out   DTaP/Tdap/Td  Discontinued   COVID-19 Vaccine  Discontinued   Zoster Vaccines- Shingrix  Discontinued    Health Maintenance  Health Maintenance Due  Topic Date Due   Colonoscopy  06/05/2022    Colorectal cancer screening: Type of screening: Colonoscopy. Completed 2023. Repeat every 5 years  Mammogram status: Completed 01/30/2023. Repeat every year  Bone Density status: Ordered declined. Pt provided with contact info and advised to call to schedule appt.  Lung Cancer Screening: (Low Dose CT Chest recommended if Age 66-80 years, 20 pack-year currently smoking OR have quit w/in 15years.) does qualify.   Lung Cancer Screening Referral: done 08/12/2023  Additional Screening:  Hepatitis C Screening: does qualify; Completed 12/01/2023  Vision Screening: Recommended annual ophthalmology exams for early detection of glaucoma and other disorders of the eye. Is the patient up to date  with their annual eye exam?  Yes  Who is the provider or what is  the name of the office in which the patient attends annual eye exams? Dr.Groat If pt is not established with a provider, would they like to be referred to a provider to establish care?  Established .   Dental Screening: Recommended annual dental exams for proper oral hygiene - she is not going  Diabetic Foot Exam: not diabetic  Community Resource Referral / Chronic Care Management: CRR required this visit?  No   CCM required this visit?  No     Plan:    Problem List Items Addressed This Visit       Other   Encounter for Medicare annual wellness exam - Primary   Pt's annual wellness exam was performed and geriatric assessment reviewed.  Pt has no new identiafble wellness concerns at this time.  WIll obtain routine labs.  Will obtain UA and micro.  Behavior modifications discussed and diet history reviewed. Pt will continue to exercise regularly and modify diet, with low GI, plant based foods and decrease food intake of processed foods.  Recommend intake of daily multivitamin, Vitamin D, and calcium. Recommond mammogram and colonoscopy for preventive screenings, as well as recommend immunizations that include influenza (up to date) and TDAP         I have personally reviewed and noted the following in the patient's chart:   Medical and social history Use of alcohol, tobacco or illicit drugs  Current medications and supplements including opioid prescriptions. Patient is not currently taking opioid prescriptions. Functional ability and status Nutritional status Physical activity Advanced directives List of other physicians Hospitalizations, surgeries, and ER visits in previous 12 months Vitals Screenings to include cognitive, depression, and falls Referrals and appointments  In addition, I have reviewed and discussed with patient certain preventive protocols, quality metrics, and best practice recommendations. A written personalized care plan for preventive services as  well as general preventive health recommendations were provided to patient.     Susanna Epley, FNP   03/30/2024  After Visit Summary: (In Person-Printed) AVS printed and given to the patient  Nurse Notes:

## 2024-03-31 DIAGNOSIS — N2581 Secondary hyperparathyroidism of renal origin: Secondary | ICD-10-CM | POA: Diagnosis not present

## 2024-03-31 DIAGNOSIS — D631 Anemia in chronic kidney disease: Secondary | ICD-10-CM | POA: Diagnosis not present

## 2024-03-31 DIAGNOSIS — D509 Iron deficiency anemia, unspecified: Secondary | ICD-10-CM | POA: Diagnosis not present

## 2024-03-31 DIAGNOSIS — D689 Coagulation defect, unspecified: Secondary | ICD-10-CM | POA: Diagnosis not present

## 2024-03-31 DIAGNOSIS — N186 End stage renal disease: Secondary | ICD-10-CM | POA: Diagnosis not present

## 2024-03-31 DIAGNOSIS — Z992 Dependence on renal dialysis: Secondary | ICD-10-CM | POA: Diagnosis not present

## 2024-03-31 DIAGNOSIS — E875 Hyperkalemia: Secondary | ICD-10-CM | POA: Diagnosis not present

## 2024-04-02 DIAGNOSIS — D509 Iron deficiency anemia, unspecified: Secondary | ICD-10-CM | POA: Diagnosis not present

## 2024-04-02 DIAGNOSIS — Z992 Dependence on renal dialysis: Secondary | ICD-10-CM | POA: Diagnosis not present

## 2024-04-02 DIAGNOSIS — N186 End stage renal disease: Secondary | ICD-10-CM | POA: Diagnosis not present

## 2024-04-02 DIAGNOSIS — E875 Hyperkalemia: Secondary | ICD-10-CM | POA: Diagnosis not present

## 2024-04-02 DIAGNOSIS — D631 Anemia in chronic kidney disease: Secondary | ICD-10-CM | POA: Diagnosis not present

## 2024-04-02 DIAGNOSIS — D689 Coagulation defect, unspecified: Secondary | ICD-10-CM | POA: Diagnosis not present

## 2024-04-02 DIAGNOSIS — N2581 Secondary hyperparathyroidism of renal origin: Secondary | ICD-10-CM | POA: Diagnosis not present

## 2024-04-05 DIAGNOSIS — N2581 Secondary hyperparathyroidism of renal origin: Secondary | ICD-10-CM | POA: Diagnosis not present

## 2024-04-05 DIAGNOSIS — Z992 Dependence on renal dialysis: Secondary | ICD-10-CM | POA: Diagnosis not present

## 2024-04-05 DIAGNOSIS — D689 Coagulation defect, unspecified: Secondary | ICD-10-CM | POA: Diagnosis not present

## 2024-04-05 DIAGNOSIS — N186 End stage renal disease: Secondary | ICD-10-CM | POA: Diagnosis not present

## 2024-04-05 DIAGNOSIS — E875 Hyperkalemia: Secondary | ICD-10-CM | POA: Diagnosis not present

## 2024-04-05 DIAGNOSIS — D631 Anemia in chronic kidney disease: Secondary | ICD-10-CM | POA: Diagnosis not present

## 2024-04-05 DIAGNOSIS — D509 Iron deficiency anemia, unspecified: Secondary | ICD-10-CM | POA: Diagnosis not present

## 2024-04-07 DIAGNOSIS — N186 End stage renal disease: Secondary | ICD-10-CM | POA: Diagnosis not present

## 2024-04-07 DIAGNOSIS — D509 Iron deficiency anemia, unspecified: Secondary | ICD-10-CM | POA: Diagnosis not present

## 2024-04-07 DIAGNOSIS — D631 Anemia in chronic kidney disease: Secondary | ICD-10-CM | POA: Diagnosis not present

## 2024-04-07 DIAGNOSIS — N2581 Secondary hyperparathyroidism of renal origin: Secondary | ICD-10-CM | POA: Diagnosis not present

## 2024-04-07 DIAGNOSIS — D689 Coagulation defect, unspecified: Secondary | ICD-10-CM | POA: Diagnosis not present

## 2024-04-07 DIAGNOSIS — Z992 Dependence on renal dialysis: Secondary | ICD-10-CM | POA: Diagnosis not present

## 2024-04-07 DIAGNOSIS — E875 Hyperkalemia: Secondary | ICD-10-CM | POA: Diagnosis not present

## 2024-04-09 DIAGNOSIS — N2581 Secondary hyperparathyroidism of renal origin: Secondary | ICD-10-CM | POA: Diagnosis not present

## 2024-04-09 DIAGNOSIS — D631 Anemia in chronic kidney disease: Secondary | ICD-10-CM | POA: Diagnosis not present

## 2024-04-09 DIAGNOSIS — E875 Hyperkalemia: Secondary | ICD-10-CM | POA: Diagnosis not present

## 2024-04-09 DIAGNOSIS — D689 Coagulation defect, unspecified: Secondary | ICD-10-CM | POA: Diagnosis not present

## 2024-04-09 DIAGNOSIS — N186 End stage renal disease: Secondary | ICD-10-CM | POA: Diagnosis not present

## 2024-04-09 DIAGNOSIS — D509 Iron deficiency anemia, unspecified: Secondary | ICD-10-CM | POA: Diagnosis not present

## 2024-04-09 DIAGNOSIS — Z992 Dependence on renal dialysis: Secondary | ICD-10-CM | POA: Diagnosis not present

## 2024-04-12 DIAGNOSIS — E875 Hyperkalemia: Secondary | ICD-10-CM | POA: Diagnosis not present

## 2024-04-12 DIAGNOSIS — D689 Coagulation defect, unspecified: Secondary | ICD-10-CM | POA: Diagnosis not present

## 2024-04-12 DIAGNOSIS — N186 End stage renal disease: Secondary | ICD-10-CM | POA: Diagnosis not present

## 2024-04-12 DIAGNOSIS — D631 Anemia in chronic kidney disease: Secondary | ICD-10-CM | POA: Diagnosis not present

## 2024-04-12 DIAGNOSIS — D509 Iron deficiency anemia, unspecified: Secondary | ICD-10-CM | POA: Diagnosis not present

## 2024-04-12 DIAGNOSIS — Z0000 Encounter for general adult medical examination without abnormal findings: Secondary | ICD-10-CM | POA: Insufficient documentation

## 2024-04-12 DIAGNOSIS — N2581 Secondary hyperparathyroidism of renal origin: Secondary | ICD-10-CM | POA: Diagnosis not present

## 2024-04-12 DIAGNOSIS — Z992 Dependence on renal dialysis: Secondary | ICD-10-CM | POA: Diagnosis not present

## 2024-04-12 NOTE — Patient Instructions (Signed)
 Whitney Walker , Thank you for taking time to come for your Medicare Wellness Visit. I appreciate your ongoing commitment to your health goals. Please review the following plan we discussed and let me know if I can assist you in the future.   These are the goals we discussed:  Goals       I will consider seeing a Pulmonologist for my COPD (pt-stated)      Care Coordination Interventions: Evaluation of current treatment plan related to COPD and patient's adherence to plan as established by provider Provided education about and advised patient to utilize infection prevention Reviewed and discussed with patient, her next scheduled upcoming Pulmonologist visit with Dr. Judeth Horn scheduled for 03/11/24 @3 :15 PM        Increase physical activity      To increase physical activity once having PT for ankle and shoulder      Manage My Medicine      Timeframe:  Long-Range Goal Priority:  High Start Date:                             Expected End Date:                       Follow Up Date 06/14/2021   - call for medicine refill 2 or 3 days before it runs out - keep a list of all the medicines I take; vitamins and herbals too - learn to read medicine labels - use a pillbox to sort medicine - use an alarm clock or phone to remind me to take my medicine    Why is this important?   These steps will help you keep on track with your medicines.       Patient Stated      No goals      Patient Stated      08/03/2020, no goals      Patient Stated      08/09/2021, no goals      Patient Stated      03/14/2022, no goals      Patient Stated      03/20/2023, no goals      Patient Stated (pt-stated)      Patient has no goals       To avoid having more falls      Care Coordination Interventions: Assessed for falls since last encounter Assessed patients knowledge of fall risk prevention secondary to previously provided education Determined patient completed her orthopedic follow up, she will start  outpatient PT on 03/30/24 at [facility] Reviewed fall precautions with patient  Discussed plans with patient for ongoing care coordination follow up and provided patient with direct contact information for nurse care coordinator Reviewed and discussed next PCP follow up for AWV Scheduled nurse follow up call with patient for 05/06/24 @12 :00 PM      To understand why my Atorvastatin was increased (pt-stated)      Care Coordination Interventions: Evaluation of current treatment plan related to aortic atherosclerosis and patient's adherence to plan as established by provider Reviewed and discussed with patient her recent follow up with Dr. Jacinto Halim, Cardiologist was rescheduled due to patient had to leave the office before being seen due to her scheduled uber ride came before she could be seen by the doctor Determined patient has been experiencing some intermittent chest pain but she is unsure if this pain is coming from her  dialysis catheter, she will report symptoms to Dr. Berry Bristol at next scheduled visit on 01/20/24 @12 :00 PM Instructed patient to call 911 if symptoms persist or worsen and she verbalizes understanding         This is a list of the screening recommended for you and due dates:  Health Maintenance  Topic Date Due   Colon Cancer Screening  06/05/2022   DEXA scan (bone density measurement)  07/28/2024*   Pneumonia Vaccine (3 of 3 - PPSV23, PCV20 or PCV21) 03/30/2025*   Flu Shot  07/30/2024   Screening for Lung Cancer  08/11/2024   Mammogram  01/30/2025   Medicare Annual Wellness Visit  04/12/2025   Hepatitis C Screening  Completed   HPV Vaccine  Aged Out   Meningitis B Vaccine  Aged Out   DTaP/Tdap/Td vaccine  Discontinued   COVID-19 Vaccine  Discontinued   Zoster (Shingles) Vaccine  Discontinued  *Topic was postponed. The date shown is not the original due date.

## 2024-04-12 NOTE — Assessment & Plan Note (Signed)
 Pt's annual wellness exam was performed and geriatric assessment reviewed.  Pt has no new identiafble wellness concerns at this time.  WIll obtain routine labs.  Will obtain UA and micro.  Behavior modifications discussed and diet history reviewed. Pt will continue to exercise regularly and modify diet, with low GI, plant based foods and decrease food intake of processed foods.  Recommend intake of daily multivitamin, Vitamin D, and calcium. Recommond mammogram and colonoscopy for preventive screenings, as well as recommend immunizations that include influenza (up to date) and TDAP

## 2024-04-14 DIAGNOSIS — D689 Coagulation defect, unspecified: Secondary | ICD-10-CM | POA: Diagnosis not present

## 2024-04-14 DIAGNOSIS — Z992 Dependence on renal dialysis: Secondary | ICD-10-CM | POA: Diagnosis not present

## 2024-04-14 DIAGNOSIS — N2581 Secondary hyperparathyroidism of renal origin: Secondary | ICD-10-CM | POA: Diagnosis not present

## 2024-04-14 DIAGNOSIS — E875 Hyperkalemia: Secondary | ICD-10-CM | POA: Diagnosis not present

## 2024-04-14 DIAGNOSIS — D509 Iron deficiency anemia, unspecified: Secondary | ICD-10-CM | POA: Diagnosis not present

## 2024-04-14 DIAGNOSIS — D631 Anemia in chronic kidney disease: Secondary | ICD-10-CM | POA: Diagnosis not present

## 2024-04-14 DIAGNOSIS — N186 End stage renal disease: Secondary | ICD-10-CM | POA: Diagnosis not present

## 2024-04-16 DIAGNOSIS — E875 Hyperkalemia: Secondary | ICD-10-CM | POA: Diagnosis not present

## 2024-04-16 DIAGNOSIS — N186 End stage renal disease: Secondary | ICD-10-CM | POA: Diagnosis not present

## 2024-04-16 DIAGNOSIS — D509 Iron deficiency anemia, unspecified: Secondary | ICD-10-CM | POA: Diagnosis not present

## 2024-04-16 DIAGNOSIS — N2581 Secondary hyperparathyroidism of renal origin: Secondary | ICD-10-CM | POA: Diagnosis not present

## 2024-04-16 DIAGNOSIS — Z992 Dependence on renal dialysis: Secondary | ICD-10-CM | POA: Diagnosis not present

## 2024-04-16 DIAGNOSIS — D689 Coagulation defect, unspecified: Secondary | ICD-10-CM | POA: Diagnosis not present

## 2024-04-16 DIAGNOSIS — D631 Anemia in chronic kidney disease: Secondary | ICD-10-CM | POA: Diagnosis not present

## 2024-04-19 DIAGNOSIS — D509 Iron deficiency anemia, unspecified: Secondary | ICD-10-CM | POA: Diagnosis not present

## 2024-04-19 DIAGNOSIS — N2581 Secondary hyperparathyroidism of renal origin: Secondary | ICD-10-CM | POA: Diagnosis not present

## 2024-04-19 DIAGNOSIS — N186 End stage renal disease: Secondary | ICD-10-CM | POA: Diagnosis not present

## 2024-04-19 DIAGNOSIS — D631 Anemia in chronic kidney disease: Secondary | ICD-10-CM | POA: Diagnosis not present

## 2024-04-19 DIAGNOSIS — D689 Coagulation defect, unspecified: Secondary | ICD-10-CM | POA: Diagnosis not present

## 2024-04-19 DIAGNOSIS — E875 Hyperkalemia: Secondary | ICD-10-CM | POA: Diagnosis not present

## 2024-04-19 DIAGNOSIS — Z992 Dependence on renal dialysis: Secondary | ICD-10-CM | POA: Diagnosis not present

## 2024-04-20 ENCOUNTER — Other Ambulatory Visit: Payer: Self-pay | Admitting: Nurse Practitioner

## 2024-04-21 DIAGNOSIS — N186 End stage renal disease: Secondary | ICD-10-CM | POA: Diagnosis not present

## 2024-04-21 DIAGNOSIS — D509 Iron deficiency anemia, unspecified: Secondary | ICD-10-CM | POA: Diagnosis not present

## 2024-04-21 DIAGNOSIS — E875 Hyperkalemia: Secondary | ICD-10-CM | POA: Diagnosis not present

## 2024-04-21 DIAGNOSIS — N2581 Secondary hyperparathyroidism of renal origin: Secondary | ICD-10-CM | POA: Diagnosis not present

## 2024-04-21 DIAGNOSIS — D689 Coagulation defect, unspecified: Secondary | ICD-10-CM | POA: Diagnosis not present

## 2024-04-21 DIAGNOSIS — D631 Anemia in chronic kidney disease: Secondary | ICD-10-CM | POA: Diagnosis not present

## 2024-04-21 DIAGNOSIS — Z992 Dependence on renal dialysis: Secondary | ICD-10-CM | POA: Diagnosis not present

## 2024-04-23 DIAGNOSIS — D631 Anemia in chronic kidney disease: Secondary | ICD-10-CM | POA: Diagnosis not present

## 2024-04-23 DIAGNOSIS — N186 End stage renal disease: Secondary | ICD-10-CM | POA: Diagnosis not present

## 2024-04-23 DIAGNOSIS — Z992 Dependence on renal dialysis: Secondary | ICD-10-CM | POA: Diagnosis not present

## 2024-04-23 DIAGNOSIS — D509 Iron deficiency anemia, unspecified: Secondary | ICD-10-CM | POA: Diagnosis not present

## 2024-04-23 DIAGNOSIS — D689 Coagulation defect, unspecified: Secondary | ICD-10-CM | POA: Diagnosis not present

## 2024-04-23 DIAGNOSIS — N2581 Secondary hyperparathyroidism of renal origin: Secondary | ICD-10-CM | POA: Diagnosis not present

## 2024-04-23 DIAGNOSIS — E875 Hyperkalemia: Secondary | ICD-10-CM | POA: Diagnosis not present

## 2024-04-26 DIAGNOSIS — D509 Iron deficiency anemia, unspecified: Secondary | ICD-10-CM | POA: Diagnosis not present

## 2024-04-26 DIAGNOSIS — D689 Coagulation defect, unspecified: Secondary | ICD-10-CM | POA: Diagnosis not present

## 2024-04-26 DIAGNOSIS — N186 End stage renal disease: Secondary | ICD-10-CM | POA: Diagnosis not present

## 2024-04-26 DIAGNOSIS — Z992 Dependence on renal dialysis: Secondary | ICD-10-CM | POA: Diagnosis not present

## 2024-04-26 DIAGNOSIS — D631 Anemia in chronic kidney disease: Secondary | ICD-10-CM | POA: Diagnosis not present

## 2024-04-26 DIAGNOSIS — E875 Hyperkalemia: Secondary | ICD-10-CM | POA: Diagnosis not present

## 2024-04-26 DIAGNOSIS — N2581 Secondary hyperparathyroidism of renal origin: Secondary | ICD-10-CM | POA: Diagnosis not present

## 2024-04-27 DIAGNOSIS — M25511 Pain in right shoulder: Secondary | ICD-10-CM | POA: Diagnosis not present

## 2024-04-27 DIAGNOSIS — M25571 Pain in right ankle and joints of right foot: Secondary | ICD-10-CM | POA: Diagnosis not present

## 2024-04-27 DIAGNOSIS — Z7409 Other reduced mobility: Secondary | ICD-10-CM | POA: Diagnosis not present

## 2024-04-28 DIAGNOSIS — Z992 Dependence on renal dialysis: Secondary | ICD-10-CM | POA: Diagnosis not present

## 2024-04-28 DIAGNOSIS — I129 Hypertensive chronic kidney disease with stage 1 through stage 4 chronic kidney disease, or unspecified chronic kidney disease: Secondary | ICD-10-CM | POA: Diagnosis not present

## 2024-04-28 DIAGNOSIS — D631 Anemia in chronic kidney disease: Secondary | ICD-10-CM | POA: Diagnosis not present

## 2024-04-28 DIAGNOSIS — R531 Weakness: Secondary | ICD-10-CM | POA: Diagnosis not present

## 2024-04-28 DIAGNOSIS — N2581 Secondary hyperparathyroidism of renal origin: Secondary | ICD-10-CM | POA: Diagnosis not present

## 2024-04-28 DIAGNOSIS — I509 Heart failure, unspecified: Secondary | ICD-10-CM | POA: Diagnosis not present

## 2024-04-28 DIAGNOSIS — E875 Hyperkalemia: Secondary | ICD-10-CM | POA: Diagnosis not present

## 2024-04-28 DIAGNOSIS — N186 End stage renal disease: Secondary | ICD-10-CM | POA: Diagnosis not present

## 2024-04-28 DIAGNOSIS — G4733 Obstructive sleep apnea (adult) (pediatric): Secondary | ICD-10-CM | POA: Diagnosis not present

## 2024-04-28 DIAGNOSIS — D689 Coagulation defect, unspecified: Secondary | ICD-10-CM | POA: Diagnosis not present

## 2024-04-28 DIAGNOSIS — D509 Iron deficiency anemia, unspecified: Secondary | ICD-10-CM | POA: Diagnosis not present

## 2024-04-30 DIAGNOSIS — Z992 Dependence on renal dialysis: Secondary | ICD-10-CM | POA: Diagnosis not present

## 2024-04-30 DIAGNOSIS — E875 Hyperkalemia: Secondary | ICD-10-CM | POA: Diagnosis not present

## 2024-04-30 DIAGNOSIS — D689 Coagulation defect, unspecified: Secondary | ICD-10-CM | POA: Diagnosis not present

## 2024-04-30 DIAGNOSIS — D631 Anemia in chronic kidney disease: Secondary | ICD-10-CM | POA: Diagnosis not present

## 2024-04-30 DIAGNOSIS — N2581 Secondary hyperparathyroidism of renal origin: Secondary | ICD-10-CM | POA: Diagnosis not present

## 2024-04-30 DIAGNOSIS — D509 Iron deficiency anemia, unspecified: Secondary | ICD-10-CM | POA: Diagnosis not present

## 2024-04-30 DIAGNOSIS — N186 End stage renal disease: Secondary | ICD-10-CM | POA: Diagnosis not present

## 2024-05-03 DIAGNOSIS — E875 Hyperkalemia: Secondary | ICD-10-CM | POA: Diagnosis not present

## 2024-05-03 DIAGNOSIS — N2581 Secondary hyperparathyroidism of renal origin: Secondary | ICD-10-CM | POA: Diagnosis not present

## 2024-05-03 DIAGNOSIS — Z992 Dependence on renal dialysis: Secondary | ICD-10-CM | POA: Diagnosis not present

## 2024-05-03 DIAGNOSIS — N186 End stage renal disease: Secondary | ICD-10-CM | POA: Diagnosis not present

## 2024-05-03 DIAGNOSIS — D509 Iron deficiency anemia, unspecified: Secondary | ICD-10-CM | POA: Diagnosis not present

## 2024-05-03 DIAGNOSIS — D689 Coagulation defect, unspecified: Secondary | ICD-10-CM | POA: Diagnosis not present

## 2024-05-03 DIAGNOSIS — D631 Anemia in chronic kidney disease: Secondary | ICD-10-CM | POA: Diagnosis not present

## 2024-05-04 ENCOUNTER — Ambulatory Visit (INDEPENDENT_AMBULATORY_CARE_PROVIDER_SITE_OTHER): Admitting: Pulmonary Disease

## 2024-05-04 ENCOUNTER — Encounter: Payer: Self-pay | Admitting: Pulmonary Disease

## 2024-05-04 VITALS — BP 129/79 | HR 115 | Ht 61.0 in | Wt 201.0 lb

## 2024-05-04 DIAGNOSIS — R0609 Other forms of dyspnea: Secondary | ICD-10-CM | POA: Diagnosis not present

## 2024-05-04 MED ORDER — STIOLTO RESPIMAT 2.5-2.5 MCG/ACT IN AERS: 2.0000 | INHALATION_SPRAY | Freq: Every day | RESPIRATORY_TRACT | 11 refills | Status: AC

## 2024-05-04 NOTE — Progress Notes (Signed)
 @Patient  ID: Whitney Walker, female    DOB: 08/12/1956, 68 y.o.   MRN: [id number]  Chief Complaint  Patient presents with   Follow-up    Pt states she been dealing w/ a sinus / cold for the past month, cough , SOB     Referring provider: Susanna Epley, FNP  HPI:   68 y.o. woman whom we are seeing for evaluation of DOE. Most recent PCP note reviewed.  Most recent cardiology note reviewed..   Returns for routine follow-up.  Overall doing okay.  Continues stiolto with benefit.  Does think this helps with her dyspnea on a day-to-day basis.  Get around a bit easier.  In general does have fatigue etc. worse on dialysis days but overall think Stiolto has been beneficial.    HPI initial visit:  Patient reports DOE for some time. Many years. Worse on inclines or stairs. Has tried inhalers such as Breo in the past - she does not think it helps much. Recently prescribed advair. No time of day when symptoms are better or worse. No seasonal or environmental factors that make things better or worse. No other alleviating or exacerbating factors. She notes DOE is better after HD and worsened the days between HD sessions. Notes severity of DOE worse when she drinks more water.  Most recent CT 07/2022 reveals mild emphysema and bronchial wall thickening on my review and interpretation. She has about 25 pack year smoking history.  She had spirometry in 2018 that is not consistent with COPD, but suggestive of air trapping vs restriction on my review and interpretation.  Questionaires / Pulmonary Flowsheets:   ACT:      No data to display          MMRC:     No data to display          Epworth:      No data to display          Tests:   FENO:  No results found for: "NITRICOXIDE"  PFT:    Latest Ref Rng & Units 05/01/2023    3:30 PM  PFT Results  FVC-Pre L 1.55   FVC-Predicted Pre % 54   FVC-Post L 1.54   FVC-Predicted Post % 53   Pre FEV1/FVC % % 83   Post FEV1/FCV % %  84   FEV1-Pre L 1.28   FEV1-Predicted Pre % 59   FEV1-Post L 1.30   DLCO uncorrected ml/min/mmHg 9.19   DLCO UNC% % 50   DLCO corrected ml/min/mmHg 9.16   DLCO COR %Predicted % 49   DLVA Predicted % 68   TLC L 4.04   TLC % Predicted % 84   RV % Predicted % 118   Personally reviewed interpreted spirometry just with moderate restriction versus air trapping, lung volumes consistent with air trapping.  DLCO moderately reduced.  WALK:     03/12/2017    3:33 PM  SIX MIN WALK  Tech Comments: fast walk, no complications/TA/CMA    Imaging: Personally reviewed and as per EMR No results found.  Lab Results: Personally reviewed CBC    Component Value Date/Time   WBC 13.7 (H) 01/29/2024 1457   RBC 4.00 01/29/2024 1457   HGB 11.9 (L) 01/29/2024 1457   HGB 13.0 05/22/2023 1127   HGB 10.3 (L) 05/30/2022 1604   HGB 10.9 (L) 07/07/2017 1235   HCT 38.4 01/29/2024 1457   HCT 31.8 (L) 05/30/2022 1604   HCT 35.0 07/07/2017 1235  PLT 186 01/29/2024 1457   PLT 175 05/22/2023 1127   PLT 285 05/30/2022 1604   MCV 96.0 01/29/2024 1457   MCV 94 05/30/2022 1604   MCV 85.2 07/07/2017 1235   MCH 29.8 01/29/2024 1457   MCHC 31.0 01/29/2024 1457   RDW 13.8 01/29/2024 1457   RDW 13.4 05/30/2022 1604   RDW 15.8 (H) 07/07/2017 1235   LYMPHSABS 2.3 01/29/2024 1457   LYMPHSABS 2.4 07/07/2017 1235   MONOABS 0.9 01/29/2024 1457   MONOABS 0.8 07/07/2017 1235   EOSABS 0.3 01/29/2024 1457   EOSABS 0.2 07/07/2017 1235   BASOSABS 0.1 01/29/2024 1457   BASOSABS 0.0 07/07/2017 1235    BMET    Component Value Date/Time   NA 136 01/29/2024 1457   NA 139 05/30/2022 1604   NA 138 07/07/2017 1235   K 4.8 01/29/2024 1457   K 4.5 07/07/2017 1235   CL 99 01/29/2024 1457   CL 111 (H) 12/04/2012 1056   CO2 23 01/29/2024 1457   CO2 17 (L) 07/07/2017 1235   GLUCOSE 97 01/29/2024 1457   GLUCOSE 85 07/07/2017 1235   GLUCOSE 97 12/04/2012 1056   BUN 19 01/29/2024 1457   BUN 30 (H) 05/30/2022 1604    BUN 40.8 (H) 07/07/2017 1235   CREATININE 7.35 (H) 01/29/2024 1457   CREATININE 7.43 (HH) 05/22/2023 1127   CREATININE 3.4 (HH) 07/07/2017 1235   CALCIUM  9.4 01/29/2024 1457   CALCIUM  9.5 07/07/2017 1235   GFRNONAA 6 (L) 01/29/2024 1457   GFRNONAA 6 (L) 05/22/2023 1127   GFRAA 7 (L) 05/18/2020 1036    BNP    Component Value Date/Time   BNP 406.5 (H) 10/30/2020 0407    ProBNP    Component Value Date/Time   PROBNP 435.7 (H) 03/09/2014 1421    Specialty Problems       Pulmonary Problems   Dyspnea on exertion   OSA (obstructive sleep apnea)    Allergies  Allergen Reactions   Difelikefalin Itching   Carnosine Other (See Comments)     Unknown   Gadolinium Derivatives Hives and Other (See Comments)    HIVES, Desc: HIVES W/ "DYE" USED FOR 1ST CT SCAN BUT NOT 2ND, NO PREMEDS USED, PT UNCERTAIN OF CIRCUMSTANCES,,?POSSIBLE MRI CONTRAST ALLERGY, ALL STUDIES DONE "SOMEWHERE" IN [place] //A.C., Onset Date: [id number]   Iohexol  Hives and Other (See Comments)    Desc: HIVES W/ "DYE" USED FOR 1ST CT SCAN BUT NOT 2ND, NO PREMEDS USED, PT UNCERTAIN OF CIRCUMSTANCES,,?POSSIBLE MRI CONTRAST ALLERGY, ALL STUDIES DONE "SOMEWHERE" IN [place] //A.C., Onset Date: [id number]    Iodinated Contrast Media Hives    1974   Iodine Hives    1974   Naltrexone Other (See Comments)    Unknown 1974     Immunization History  Administered Date(s) Administered   Fluad Quad(high Dose 65+) 10/04/2019   Hepatitis B, ADULT 09/28/2018, 05/19/2020   Hepb-cpg 04/16/2023   Influenza, High Dose Seasonal PF 12/29/2020, 10/10/2021   Influenza, Quadrivalent, Recombinant, Inj, Pf 10/23/2022   Influenza,inj,Quad PF,6+ Mos 10/04/2019, 10/04/2019   Influenza,inj,quad, With Preservative 09/23/2018   Influenza-Unspecified 10/13/2023   Moderna Sars-Covid-2 Vaccination 03/10/2020, 04/07/2020, 12/22/2020   Pneumococcal Conjugate-13 04/20/2021   Pneumococcal Polysaccharide-23 03/22/2019    Past Medical  History:  Diagnosis Date   Anemia    Anxiety    CHF (congestive heart failure) (HCC)    Colon cancer (HCC)    treatment surgery   Complication of anesthesia    after first C- [place] "couldnt walk after", patient  denies having a spinal   COPD (chronic obstructive pulmonary disease) (HCC)    Coronary artery disease    Depression    DVT (deep venous thrombosis) (HCC)    Encounter for annual health examination 08/05/2023   ESRD on hemodialysis (HCC)    Hemo: MWF   History of blood transfusion 04/2018   Hypertension    07/07/18- no longer takes BP medications   Leucocytosis 04/28/2012   Meningitis    Pain in limb 07/30/2013   PE (pulmonary embolism)    Peripheral vascular disease (HCC)    Primary hypertension 11/15/2013   Restless legs    Shortness of breath    with exertion   Sleep apnea    SOB (shortness of breath) 03/03/2019   Vertigo     Tobacco History: Social History   Tobacco Use  Smoking Status Some Days   Current packs/day: 0.00   Average packs/day: 0.5 packs/day for 50.0 years (25.0 ttl pk-yrs)   Types: Cigarettes   Start date: 10/13/1972   Last attempt to quit: 10/13/2022   Years since quitting: 1.5  Smokeless Tobacco Former  Tobacco Comments   She is using a vape when she runs out of her cigarettes, 7/27 - down to 1-2 cigs a day. 03/30/24 - less than 1-2 cigs a day   Ready to quit: Not Answered Counseling given: Not Answered Tobacco comments: She is using a vape when she runs out of her cigarettes, 7/27 - down to 1-2 cigs a day. 03/30/24 - less than 1-2 cigs a day   Continue to not smoke  Outpatient Encounter Medications as of 05/04/2024  Medication Sig   acetaminophen -codeine  (TYLENOL  #3) 300-30 MG tablet Take 1 tablet by mouth every 8 (eight) hours as needed for moderate pain (pain score 4-6).   albuterol  (VENTOLIN  HFA) 108 (90 Base) MCG/ACT inhaler Inhale 2 puffs into the lungs every 6 (six) hours as needed for wheezing or shortness of breath.   ELIQUIS  5  MG TABS tablet TAKE 1 TABLET (5 MG TOTAL) BY MOUTH 2 (TWO) TIMES DAILY.   meclizine  (ANTIVERT ) 12.5 MG tablet Take 1 tablet (12.5 mg total) by mouth 3 (three) times daily as needed for dizziness. (Patient taking differently: Take 12.5 mg by mouth as needed for dizziness.)   multivitamin (RENA-VIT) TABS tablet Take 1 tablet by mouth at bedtime.   omeprazole  (PRILOSEC) 20 MG capsule TAKE 1 CAPSULE (20 MG TOTAL) BY MOUTH DAILY.   sevelamer  carbonate (RENVELA ) 800 MG tablet Take 2-4 tablets by mouth See admin instructions. Take 4 tablets with breakfast, lunch, and dinner. Take 2 tablets with a snack   sodium zirconium cyclosilicate  (LOKELMA ) 10 g PACK packet Take 10 g by mouth daily.   vitamin E 200 UNIT capsule Take 200 Units by mouth daily.   [DISCONTINUED] midodrine  (PROAMATINE ) 10 MG tablet Take 0.5 tablets (5 mg total) by mouth 2 (two) times daily with a meal.   [DISCONTINUED] Tiotropium Bromide-Olodaterol (STIOLTO RESPIMAT ) 2.5-2.5 MCG/ACT AERS Inhale 2 puffs into the lungs daily.   atorvastatin  (LIPITOR) 20 MG tablet Take 1 tablet (20 mg total) by mouth daily.   Tiotropium Bromide-Olodaterol (STIOLTO RESPIMAT ) 2.5-2.5 MCG/ACT AERS Inhale 2 puffs into the lungs daily.   No facility-administered encounter medications on file as of 05/04/2024.     Review of Systems  Review of Systems  N/a Physical Exam  BP 129/79 (BP Location: Left Arm, Patient Position: Sitting, Cuff Size: Large)   Pulse (!) 115   Ht 5\' 1"  (1.549 m)  Wt 201 lb (91.2 kg)   SpO2 94%   BMI 37.98 kg/m   Wt Readings from Last 5 Encounters:  05/04/24 201 lb (91.2 kg)  03/30/24 198 lb 12.8 oz (90.2 kg)  02/09/24 202 lb 3.2 oz (91.7 kg)  01/29/24 199 lb (90.3 kg)  01/20/24 200 lb 3.2 oz (90.8 kg)    BMI Readings from Last 5 Encounters:  05/04/24 37.98 kg/m  03/30/24 37.56 kg/m  02/09/24 38.21 kg/m  01/29/24 37.60 kg/m  01/20/24 37.83 kg/m     Physical Exam General: Sitting in chair, in NAD Eyes: EOMI, no  icterus Neck: supple, no JVP appreciated Pulmonary: clear, NWOB on RA CV: RRR, warm Abdomen, ND, BS present MSK: No synovitis, no joint effusion Neuro: Normal gait, no weakness Psych: normal mood, full affect   Assessment & Plan:   DOE: Primarily related to fluid accumulation between HD sessions as she reports improvement after HD and gradual worsening the next day between HD sessions as well as worsening when she is less judicious with fluid restriction. Prior spirometry not consistent with COPD. Inhalers have not helped in the past.  Repeat spirometry consistent with air trapping, decreased DLCO likely in setting of emphysema.  Symptoms improved with addition of Stiolto.  Stiolto refilled today.  Tobacco abuse in remission: No longer smoking.  Low-dose CT scan 07/2023 lung RADS 2, 67-month follow-up.  Encouraged her to continue lung cancer screening via her PCP.   Return in about 6 months (around 11/04/2024) for f/u Dr. Marygrace Snellen.   Guerry Leek, MD 05/04/2024

## 2024-05-04 NOTE — Patient Instructions (Signed)
 Nice to see you again   No changes to medications  Stiolto refilled today  Return to clinic in 6 months or sooner as needed

## 2024-05-05 DIAGNOSIS — D631 Anemia in chronic kidney disease: Secondary | ICD-10-CM | POA: Diagnosis not present

## 2024-05-05 DIAGNOSIS — D689 Coagulation defect, unspecified: Secondary | ICD-10-CM | POA: Diagnosis not present

## 2024-05-05 DIAGNOSIS — D509 Iron deficiency anemia, unspecified: Secondary | ICD-10-CM | POA: Diagnosis not present

## 2024-05-05 DIAGNOSIS — Z992 Dependence on renal dialysis: Secondary | ICD-10-CM | POA: Diagnosis not present

## 2024-05-05 DIAGNOSIS — N186 End stage renal disease: Secondary | ICD-10-CM | POA: Diagnosis not present

## 2024-05-05 DIAGNOSIS — E875 Hyperkalemia: Secondary | ICD-10-CM | POA: Diagnosis not present

## 2024-05-05 DIAGNOSIS — N2581 Secondary hyperparathyroidism of renal origin: Secondary | ICD-10-CM | POA: Diagnosis not present

## 2024-05-06 ENCOUNTER — Ambulatory Visit: Payer: Self-pay

## 2024-05-06 NOTE — Patient Instructions (Signed)
 Visit Information  Thank you for taking time to visit with me today. Please don't hesitate to contact me if I can be of assistance to you before our next scheduled appointment.  Our next appointment is by telephone on June 10 at 12:30 PM Please call the care guide team at [phone] if you need to cancel or reschedule your appointment.   Following is a copy of your care plan:   Goals Addressed               This Visit's Progress     Patient Stated     COMPLETED: I will consider seeing a Pulmonologist for my COPD (pt-stated)        Care Coordination Interventions: See new goal      COMPLETED: To understand why my Atorvastatin  was increased (pt-stated)        Care Coordination Interventions: Evaluation of current treatment plan related to aortic atherosclerosis and patient's adherence to plan as established by provider Review of patient status, including review of consultant's reports, relevant laboratory and other test results, and medications completed Lipid Panel     Component Value Date/Time   CHOL 171 07/29/2023 1450   TRIG 223 (H) 07/29/2023 1450   HDL 48 07/29/2023 1450   CHOLHDL 3.6 07/29/2023 1450   CHOLHDL 5.6 09/08/2009 0403   VLDL 31 09/08/2009 0403   LDLCALC 86 07/29/2023 1450   LABVLDL 37 07/29/2023 1450         Other     COMPLETED: To avoid having more falls        Care Coordination Interventions: See new goal       VBCI RN Care Plan related to Impaired Physical Mobility, Pain to right ankle/foot post injury; recent Fall        Problems:  Chronic Disease Management support and education needs related to s/p right ankle/foot fracture; Impaired Physical Mobility, high risk for falls   Goal: Over the next 90 days the Patient will continue to work with RN Care Manager and/or Social Worker to address care management and care coordination needs related to right foot ankle/foot pain, impaired physical mobility, s/p fall  as evidenced by adherence to care  management team scheduled appointments      Interventions:   Falls Interventions: Assessed for falls since last encounter Assessed patients knowledge of fall risk prevention secondary to previously provided education Educated patient regarding fall prevention    Pain Interventions: Pain assessment performed Medications reviewed Reviewed provider established plan for pain management Discussed importance of adherence to all scheduled medical appointments Counseled on the importance of reporting any/all new or changed pain symptoms or management strategies to pain management provider Advised patient to report to care team affect of pain on daily activities Discussed use of relaxation techniques and/or diversional activities to assist with pain reduction (distraction, imagery, relaxation, massage, acupressure, TENS, heat, and cold application Reviewed with patient prescribed pharmacological and nonpharmacological pain relief strategies  Patient Self-Care Activities:  Attend all scheduled provider appointments Call pharmacy for medication refills 3-7 days in advance of running out of medications Take medications as prescribed   Work with the nurse care manager to address care coordination needs and will continue to work with the clinical team to address health care and disease management related needs Perform your HEP daily as directed by PT  Plan:  Next PCP appointment scheduled for: Tuesday, June 17 at 11:40 AM Telephone follow up appointment with care management team member scheduled for:  Tuesday, June  10 at 12:30 PM           VBCI RN Care Plan related to shortness of breath        Problems:  Chronic Disease Management support and education needs related to shortness of breath   Goal: Over the next 90 days the Patient will continue to work with RN Care Manager and/or Social Worker to address care management and care coordination needs related to shortness of breath as evidenced  by adherence to care management team scheduled appointments      Interventions:   COPD Interventions: Provided patient with basic written and verbal COPD education on self care/management/and exacerbation prevention Advised patient to track and manage COPD triggers Provided instruction about proper use of medications used for management of COPD including inhalers Provided education about and advised patient to utilize infection prevention strategies to reduce risk of respiratory infection  Patient Self-Care Activities:  Attend all scheduled provider appointments Call pharmacy for medication refills 3-7 days in advance of running out of medications Call provider office for new concerns or questions  Take medications as prescribed   eliminate symptom triggers at home follow rescue plan if symptoms flare-up do breathing exercises every day  Plan:  Telephone follow up appointment with care management team member scheduled for:  Tuesday, June 10 at 12:30 PM             Please call 1-800-273-TALK (toll free, 24 hour hotline) if you are experiencing a Mental Health or Behavioral Health Crisis or need someone to talk to.  Patient verbalizes understanding of instructions and care plan provided today and agrees to view in MyChart. Active MyChart status and patient understanding of how to access instructions and care plan via MyChart confirmed with patient.     Louanne Roussel RN BSN CCM   [facility], [facility] Nurse Care Coordinator  Direct Dial : [phone] Website: [email or web address]

## 2024-05-06 NOTE — Patient Outreach (Signed)
 Complex Care Management   Visit Note  05/06/2024  Name:  Whitney Walker MRN: [id number] DOB: 1956/03/24  Situation: Referral received for Complex Care Management related to ESRD and Dyspnea, Malignant Hypertension with heart failure and end stage renal disease, Pain to right shoulder/ankle/foot, hx of falls.  I obtained verbal consent from Patient.  Visit completed with patient on the phone.  Background:   Past Medical History:  Diagnosis Date   Anemia    Anxiety    CHF (congestive heart failure) (HCC)    Colon cancer (HCC)    treatment surgery   Complication of anesthesia    after first C- [place] "couldnt walk after", patient denies having a spinal   COPD (chronic obstructive pulmonary disease) (HCC)    Coronary artery disease    Depression    DVT (deep venous thrombosis) (HCC)    Encounter for annual health examination 08/05/2023   ESRD on hemodialysis (HCC)    Hemo: MWF   History of blood transfusion 04/2018   Hypertension    07/07/18- no longer takes BP medications   Leucocytosis 04/28/2012   Meningitis    Pain in limb 07/30/2013   PE (pulmonary embolism)    Peripheral vascular disease (HCC)    Primary hypertension 11/15/2013   Restless legs    Shortness of breath    with exertion   Sleep apnea    SOB (shortness of breath) 03/03/2019   Vertigo     Assessment: Patient Reported Symptoms:  Cognitive Cognitive Status: Alert and oriented to person, place, and time Cognitive/Intellectual Conditions Management [RPT]: None reported or documented in medical history or problem list   Health Maintenance Behaviors: Annual physical exam, Immunizations, Healthy diet, Exercise, Spiritual practice(s), Social activities Health Facilitated by: Healthy diet, Prayer/meditation, Rest, Pain control  Neurological Neurological Review of Symptoms: No symptoms reported    HEENT HEENT Symptoms Reported: Runny nose HEENT Management Strategies: Routine screening HEENT Self-Management  Outcome: 4 (good)    Cardiovascular Cardiovascular Symptoms Reported: No symptoms reported Does patient have uncontrolled Hypertension?: No Cardiovascular Conditions: Heart failure, Hypertension, Coronary artery disease (PAD) Cardiovascular Management Strategies: Medication therapy, Routine screening Cardiovascular Self-Management Outcome: 4 (good)  Respiratory Respiratory Symptoms Reported: Productive cough, Shortness of breath Additional Respiratory Details: Patient notified her pulmonologist, symptoms are improving Respiratory Conditions: Sleep disordered breathing, Shortness of breath Respiratory Self-Management Outcome: 4 (good)  Endocrine Patient reports the following symptoms related to hypoglycemia or hyperglycemia : No symptoms reported Is patient diabetic?: No Endocrine Conditions: Diabetes Endocrine Management Strategies: Routine screening, Diet modification Endocrine Self-Management Outcome: 4 (good)  Gastrointestinal Gastrointestinal Symptoms Reported: No symptoms reported   Nutrition Risk Screen (CP): No indicators present  Genitourinary Genitourinary Symptoms Reported: No symptoms reported Genitourinary Conditions: End-stage renal disease Genitourinary Management Strategies: Hemodialysis Hemodialysis Schedule: M,W, F Hemodialysis Last Treatment: 05/05/24 Genitourinary Self-Management Outcome: 4 (good)  Integumentary Integumentary Symptoms Reported: No symptoms reported    Musculoskeletal Musculoskelatal Symptoms Reviewed: Difficulty walking Additional Musculoskeletal Details: right ankle/foot   Falls in the past year?: Yes Number of falls in past year: 1 or less Was there an injury with Fall?: Yes Fall Risk Category Calculator: 2 Patient Fall Risk Level: Moderate Fall Risk Patient at Risk for Falls Due to: Impaired balance/gait, Impaired mobility Fall risk Follow up: Falls evaluation completed, Education provided, Falls prevention discussed  Psychosocial  Psychosocial Symptoms Reported: Anxiety - if selected complete GAD Behavioral Health Conditions: Anxiety Behavioral Management Strategies: Coping strategies, Adequate rest Behavioral Health Self-Management Outcome: 4 (good) Major Change/Loss/Stressor/Fears (CP):  Denies Techniques to Cope with Loss/Stress/Change: Withdraw Quality of Family Relationships: involved, supportive Do you feel physically threatened by others?: No      05/06/2024    2:44 PM  Depression screen PHQ 2/9  Decreased Interest 1  Down, Depressed, Hopeless 1  PHQ - 2 Score 2    There were no vitals filed for this visit.  Medications Reviewed Today     Reviewed by Kaylene Pascal, RN (Registered Nurse) on 05/06/24 at 1404  Med List Status: <None>   Medication Order Taking? Sig Documenting Provider Last Dose Status Informant  acetaminophen -codeine  (TYLENOL  #3) 300-30 MG tablet [id number] Yes Take 1 tablet by mouth every 8 (eight) hours as needed for moderate pain (pain score 4-6). Susanna Epley, FNP Taking Active   albuterol  (VENTOLIN  HFA) [phone] 1982 Base) MCG/ACT inhaler [id number] Yes Inhale 2 puffs into the lungs every 6 (six) hours as needed for wheezing or shortness of breath. Susanna Epley, FNP Taking Active Self, Pharmacy Records  atorvastatin  (LIPITOR) 20 MG tablet [id number] Yes Take 1 tablet (20 mg total) by mouth daily. Knox Perl, MD Taking Active   Biotin  5 MG CAPS [id number] Yes Take 5 mg by mouth daily at 6 (six) AM. [provider] Taking Active   ELIQUIS  5 MG TABS tablet [id number] Yes TAKE 1 TABLET (5 MG TOTAL) BY MOUTH 2 (TWO) TIMES DAILY. Susanna Epley, FNP Taking Active   meclizine  (ANTIVERT ) 12.5 MG tablet [id number] No Take 1 tablet (12.5 mg total) by mouth 3 (three) times daily as needed for dizziness.  Patient not taking: Reported on 05/06/2024   Susanna Epley, FNP Not Taking Active Self, Pharmacy Records  multivitamin (RENA-VIT) TABS tablet [id number] Yes Take 1 tablet by mouth at bedtime.  [provider] Taking Active Self, Pharmacy Records  omeprazole  (PRILOSEC) 20 MG capsule [id number] Yes TAKE 1 CAPSULE (20 MG TOTAL) BY MOUTH DAILY. Susanna Epley, FNP Taking Active   sevelamer  carbonate (RENVELA ) 800 MG tablet [id number] Yes Take 2-4 tablets by mouth See admin instructions. Take 4 tablets with breakfast, lunch, and dinner. Take 2 tablets with a snack [provider] Taking Active Self, Pharmacy Records  sodium zirconium cyclosilicate  (LOKELMA ) 10 g PACK packet [id number] Yes Take 10 g by mouth daily. [provider] Taking Active Self, Pharmacy Records  Tiotropium Bromide-Olodaterol (STIOLTO RESPIMAT ) 2.5-2.5 MCG/ACT AERS [id number] Yes Inhale 2 puffs into the lungs daily. Hunsucker, Archer Kobs, MD Taking Active   vitamin C  (ASCORBIC ACID ) 250 MG tablet [id number] Yes Take 250 mg by mouth daily. [provider] Taking Active   vitamin E 200 UNIT capsule [id number] Yes Take 200 Units by mouth daily. [provider] Taking Active Self, Pharmacy Records  Med List Note (Bruning, Kevin, PA-C 05/30/22 1327):              Recommendation:   PCP Follow-up  Follow Up Plan:   Telephone follow up appointment date/time:  Tuesday, June 10 at 12:30 PM  Louanne Roussel RN BSN CCM [place]  [facility], [facility] Nurse Care Coordinator  Direct Dial : [phone] Website: [email or web address]

## 2024-05-07 DIAGNOSIS — D509 Iron deficiency anemia, unspecified: Secondary | ICD-10-CM | POA: Diagnosis not present

## 2024-05-07 DIAGNOSIS — E875 Hyperkalemia: Secondary | ICD-10-CM | POA: Diagnosis not present

## 2024-05-07 DIAGNOSIS — D689 Coagulation defect, unspecified: Secondary | ICD-10-CM | POA: Diagnosis not present

## 2024-05-07 DIAGNOSIS — N186 End stage renal disease: Secondary | ICD-10-CM | POA: Diagnosis not present

## 2024-05-07 DIAGNOSIS — Z992 Dependence on renal dialysis: Secondary | ICD-10-CM | POA: Diagnosis not present

## 2024-05-07 DIAGNOSIS — D631 Anemia in chronic kidney disease: Secondary | ICD-10-CM | POA: Diagnosis not present

## 2024-05-07 DIAGNOSIS — N2581 Secondary hyperparathyroidism of renal origin: Secondary | ICD-10-CM | POA: Diagnosis not present

## 2024-05-09 ENCOUNTER — Encounter (HOSPITAL_COMMUNITY): Payer: Self-pay

## 2024-05-09 ENCOUNTER — Other Ambulatory Visit: Payer: Self-pay

## 2024-05-09 ENCOUNTER — Emergency Department (HOSPITAL_COMMUNITY)

## 2024-05-09 ENCOUNTER — Emergency Department (HOSPITAL_COMMUNITY)
Admission: EM | Admit: 2024-05-09 | Discharge: 2024-05-10 | Disposition: A | Discharge status: Home / Self Care | Attending: Emergency Medicine | Admitting: Emergency Medicine

## 2024-05-09 DIAGNOSIS — Z7901 Long term (current) use of anticoagulants: Secondary | ICD-10-CM | POA: Diagnosis not present

## 2024-05-09 DIAGNOSIS — F17210 Nicotine dependence, cigarettes, uncomplicated: Secondary | ICD-10-CM | POA: Diagnosis not present

## 2024-05-09 DIAGNOSIS — I2510 Atherosclerotic heart disease of native coronary artery without angina pectoris: Secondary | ICD-10-CM | POA: Diagnosis not present

## 2024-05-09 DIAGNOSIS — D631 Anemia in chronic kidney disease: Secondary | ICD-10-CM | POA: Diagnosis not present

## 2024-05-09 DIAGNOSIS — Z7951 Long term (current) use of inhaled steroids: Secondary | ICD-10-CM | POA: Insufficient documentation

## 2024-05-09 DIAGNOSIS — R079 Chest pain, unspecified: Secondary | ICD-10-CM | POA: Diagnosis not present

## 2024-05-09 DIAGNOSIS — J439 Emphysema, unspecified: Secondary | ICD-10-CM | POA: Diagnosis not present

## 2024-05-09 DIAGNOSIS — I132 Hypertensive heart and chronic kidney disease with heart failure and with stage 5 chronic kidney disease, or end stage renal disease: Secondary | ICD-10-CM | POA: Insufficient documentation

## 2024-05-09 DIAGNOSIS — J449 Chronic obstructive pulmonary disease, unspecified: Secondary | ICD-10-CM | POA: Insufficient documentation

## 2024-05-09 DIAGNOSIS — R918 Other nonspecific abnormal finding of lung field: Secondary | ICD-10-CM | POA: Diagnosis not present

## 2024-05-09 DIAGNOSIS — Z955 Presence of coronary angioplasty implant and graft: Secondary | ICD-10-CM | POA: Insufficient documentation

## 2024-05-09 DIAGNOSIS — N186 End stage renal disease: Secondary | ICD-10-CM | POA: Insufficient documentation

## 2024-05-09 DIAGNOSIS — I3481 Nonrheumatic mitral (valve) annulus calcification: Secondary | ICD-10-CM | POA: Diagnosis not present

## 2024-05-09 DIAGNOSIS — Z85038 Personal history of other malignant neoplasm of large intestine: Secondary | ICD-10-CM | POA: Diagnosis not present

## 2024-05-09 DIAGNOSIS — I5032 Chronic diastolic (congestive) heart failure: Secondary | ICD-10-CM | POA: Insufficient documentation

## 2024-05-09 DIAGNOSIS — I517 Cardiomegaly: Secondary | ICD-10-CM | POA: Diagnosis not present

## 2024-05-09 DIAGNOSIS — E875 Hyperkalemia: Secondary | ICD-10-CM | POA: Diagnosis not present

## 2024-05-09 DIAGNOSIS — I120 Hypertensive chronic kidney disease with stage 5 chronic kidney disease or end stage renal disease: Secondary | ICD-10-CM | POA: Diagnosis not present

## 2024-05-09 DIAGNOSIS — I509 Heart failure, unspecified: Secondary | ICD-10-CM | POA: Diagnosis not present

## 2024-05-09 DIAGNOSIS — J441 Chronic obstructive pulmonary disease with (acute) exacerbation: Secondary | ICD-10-CM | POA: Insufficient documentation

## 2024-05-09 DIAGNOSIS — Z743 Need for continuous supervision: Secondary | ICD-10-CM | POA: Diagnosis not present

## 2024-05-09 DIAGNOSIS — Z992 Dependence on renal dialysis: Secondary | ICD-10-CM | POA: Diagnosis not present

## 2024-05-09 DIAGNOSIS — R0600 Dyspnea, unspecified: Secondary | ICD-10-CM | POA: Diagnosis present

## 2024-05-09 DIAGNOSIS — R6889 Other general symptoms and signs: Secondary | ICD-10-CM | POA: Diagnosis not present

## 2024-05-09 DIAGNOSIS — R0789 Other chest pain: Secondary | ICD-10-CM | POA: Diagnosis not present

## 2024-05-09 DIAGNOSIS — I700 Atherosclerosis of aorta: Secondary | ICD-10-CM | POA: Diagnosis not present

## 2024-05-09 LAB — BASIC METABOLIC PANEL WITH GFR
Anion gap: 14 (ref 5–15)
BUN: 33 mg/dL — ABNORMAL HIGH (ref 8–23)
CO2: 21 mmol/L — ABNORMAL LOW (ref 22–32)
Calcium: 8.8 mg/dL — ABNORMAL LOW (ref 8.9–10.3)
Chloride: 99 mmol/L (ref 98–111)
Creatinine, Ser: 8.84 mg/dL — ABNORMAL HIGH (ref 0.44–1.00)
GFR, Estimated: 4 mL/min — ABNORMAL LOW (ref >60)
Glucose, Bld: 106 mg/dL — ABNORMAL HIGH (ref 70–99)
Potassium: 6 mmol/L — ABNORMAL HIGH (ref 3.5–5.1)
Sodium: 134 mmol/L — ABNORMAL LOW (ref 135–145)

## 2024-05-09 LAB — CBC
HCT: 41.9 % (ref 36.0–46.0)
Hemoglobin: 12.6 g/dL (ref 12.0–15.0)
MCH: 29 pg (ref 26.0–34.0)
MCHC: 30.1 g/dL (ref 30.0–36.0)
MCV: 96.5 fL (ref 80.0–100.0)
Platelets: 212 10*3/uL (ref 150–400)
RBC: 4.34 MIL/uL (ref 3.87–5.11)
RDW: 15.9 % — ABNORMAL HIGH (ref 11.5–15.5)
WBC: 12 10*3/uL — ABNORMAL HIGH (ref 4.0–10.5)
nRBC: 0 % (ref 0.0–0.2)

## 2024-05-09 LAB — CBG MONITORING, ED
Glucose-Capillary: 76 mg/dL (ref 70–99)
Glucose-Capillary: 114 mg/dL — ABNORMAL HIGH (ref 70–99)

## 2024-05-09 LAB — TROPONIN I (HIGH SENSITIVITY)
Troponin I (High Sensitivity): 27 ng/L — ABNORMAL HIGH (ref <18)
Troponin I (High Sensitivity): 28 ng/L — ABNORMAL HIGH (ref <18)

## 2024-05-09 LAB — POTASSIUM: Potassium: 6.9 mmol/L (ref 3.5–5.1)

## 2024-05-09 LAB — BRAIN NATRIURETIC PEPTIDE: B Natriuretic Peptide: 147.9 pg/mL — ABNORMAL HIGH (ref 0.0–100.0)

## 2024-05-09 LAB — HEPATITIS B SURFACE ANTIGEN: Hepatitis B Surface Ag: NONREACTIVE

## 2024-05-09 MED ORDER — LIDOCAINE VISCOUS HCL 2 % MT SOLN
15.0000 mL | Freq: Once | OROMUCOSAL | Status: AC
  Administered 2024-05-09: 15 mL via ORAL
  Filled 2024-05-09: qty 15

## 2024-05-09 MED ORDER — MORPHINE SULFATE (PF) 2 MG/ML IV SOLN
2.0000 mg | Freq: Once | INTRAVENOUS | Status: AC
  Administered 2024-05-09: 2 mg via INTRAVENOUS
  Filled 2024-05-09: qty 1

## 2024-05-09 MED ORDER — IOHEXOL 350 MG/ML SOLN
75.0000 mL | Freq: Once | INTRAVENOUS | Status: AC | PRN
  Administered 2024-05-09: 75 mL via INTRAVENOUS

## 2024-05-09 MED ORDER — ANTICOAGULANT SODIUM CITRATE 4% (200MG/5ML) IV SOLN
5.0000 mL | Status: DC | PRN
  Filled 2024-05-09: qty 5

## 2024-05-09 MED ORDER — CHLORHEXIDINE GLUCONATE CLOTH 2 % EX PADS: 6.0000 | MEDICATED_PAD | Freq: Every day | CUTANEOUS | Status: DC

## 2024-05-09 MED ORDER — ALTEPLASE 2 MG IJ SOLR: 2.0000 mg | Freq: Once | INTRAMUSCULAR | Status: DC | PRN

## 2024-05-09 MED ORDER — HYDROMORPHONE HCL 2 MG PO TABS
ORAL_TABLET | ORAL | Status: AC
  Filled 2024-05-09: qty 1

## 2024-05-09 MED ORDER — INSULIN ASPART 100 UNIT/ML IV SOLN
5.0000 [IU] | Freq: Once | INTRAVENOUS | Status: AC
  Administered 2024-05-09: 5 [IU] via INTRAVENOUS

## 2024-05-09 MED ORDER — HYDROMORPHONE HCL 1 MG/ML IJ SOLN
1.0000 mg | Freq: Once | INTRAMUSCULAR | Status: AC
  Administered 2024-05-09: 1 mg via INTRAVENOUS
  Filled 2024-05-09: qty 1

## 2024-05-09 MED ORDER — IPRATROPIUM-ALBUTEROL 0.5-2.5 (3) MG/3ML IN SOLN
3.0000 mL | Freq: Once | RESPIRATORY_TRACT | Status: AC
  Administered 2024-05-09: 3 mL via RESPIRATORY_TRACT
  Filled 2024-05-09: qty 3

## 2024-05-09 MED ORDER — NEPRO/CARBSTEADY PO LIQD
237.0000 mL | ORAL | Status: DC | PRN
  Filled 2024-05-09: qty 237

## 2024-05-09 MED ORDER — LIDOCAINE-PRILOCAINE 2.5-2.5 % EX CREA: 1.0000 | TOPICAL_CREAM | CUTANEOUS | Status: DC | PRN

## 2024-05-09 MED ORDER — DEXTROSE 50 % IV SOLN
1.0000 | Freq: Once | INTRAVENOUS | Status: AC
  Administered 2024-05-09: 50 mL via INTRAVENOUS
  Filled 2024-05-09: qty 50

## 2024-05-09 MED ORDER — PENTAFLUOROPROP-TETRAFLUOROETH EX AERO: 1.0000 | INHALATION_SPRAY | CUTANEOUS | Status: DC | PRN

## 2024-05-09 MED ORDER — ALUM & MAG HYDROXIDE-SIMETH 200-200-20 MG/5ML PO SUSP
30.0000 mL | Freq: Once | ORAL | Status: AC
  Administered 2024-05-09: 30 mL via ORAL
  Filled 2024-05-09: qty 30

## 2024-05-09 MED ORDER — LIDOCAINE 5 % EX PTCH
1.0000 | MEDICATED_PATCH | Freq: Once | CUTANEOUS | Status: AC
  Administered 2024-05-09: 1 via TRANSDERMAL
  Filled 2024-05-09: qty 1

## 2024-05-09 MED ORDER — HYDROMORPHONE HCL 2 MG PO TABS
2.0000 mg | ORAL_TABLET | ORAL | Status: DC | PRN
  Administered 2024-05-09: 2 mg via ORAL

## 2024-05-09 MED ORDER — HEPARIN SODIUM (PORCINE) 1000 UNIT/ML DIALYSIS
4000.0000 [IU] | Freq: Once | INTRAMUSCULAR | Status: AC
  Administered 2024-05-09: 4000 [IU] via INTRAVENOUS_CENTRAL

## 2024-05-09 MED ORDER — HEPARIN SODIUM (PORCINE) 1000 UNIT/ML DIALYSIS: 1000.0000 [IU] | INTRAMUSCULAR | Status: DC | PRN

## 2024-05-09 MED ORDER — LIDOCAINE HCL (PF) 1 % IJ SOLN: 5.0000 mL | INTRAMUSCULAR | Status: DC | PRN

## 2024-05-09 MED ORDER — METHYLPREDNISOLONE SODIUM SUCC 125 MG IJ SOLR
125.0000 mg | Freq: Once | INTRAMUSCULAR | Status: AC
  Administered 2024-05-09: 125 mg via INTRAVENOUS
  Filled 2024-05-09: qty 2

## 2024-05-09 MED ORDER — HEPARIN SODIUM (PORCINE) 1000 UNIT/ML DIALYSIS
2000.0000 [IU] | INTRAMUSCULAR | Status: AC | PRN
  Administered 2024-05-09: 2000 [IU] via INTRAVENOUS_CENTRAL

## 2024-05-09 MED ORDER — SODIUM ZIRCONIUM CYCLOSILICATE 10 G PO PACK
10.0000 g | PACK | Freq: Once | ORAL | Status: AC
  Administered 2024-05-09: 10 g via ORAL
  Filled 2024-05-09: qty 1

## 2024-05-09 MED ORDER — ACETAMINOPHEN 500 MG PO TABS
1000.0000 mg | ORAL_TABLET | Freq: Once | ORAL | Status: AC
  Administered 2024-05-09: 1000 mg via ORAL
  Filled 2024-05-09: qty 2

## 2024-05-09 MED ORDER — CALCIUM GLUCONATE-NACL 1-0.675 GM/50ML-% IV SOLN
1.0000 g | Freq: Once | INTRAVENOUS | Status: AC
  Administered 2024-05-09: 1000 mg via INTRAVENOUS
  Filled 2024-05-09: qty 50

## 2024-05-09 MED ORDER — HEPARIN SODIUM (PORCINE) 1000 UNIT/ML IJ SOLN
INTRAMUSCULAR | Status: AC
  Filled 2024-05-09: qty 10

## 2024-05-09 MED ORDER — ONDANSETRON HCL 4 MG/2ML IJ SOLN
4.0000 mg | Freq: Once | INTRAMUSCULAR | Status: AC
  Administered 2024-05-09: 4 mg via INTRAVENOUS
  Filled 2024-05-09: qty 2

## 2024-05-09 NOTE — ED Provider Notes (Cosign Needed Addendum)
  Physical Exam  BP 111/65 (BP Location: Left Arm)   Pulse (!) 112   Temp 98.2 F (36.8 C) (Oral)   Resp 11   SpO2 (!) 89%   Physical Exam Vitals and nursing note reviewed.  Constitutional:      General: She is not in acute distress.    Appearance: She is not toxic-appearing.  HENT:     Head: Normocephalic and atraumatic.  Pulmonary:     Effort: No respiratory distress.  Skin:    Coloration: Skin is not jaundiced or pale.  Neurological:     Mental Status: She is alert and oriented to person, place, and time.  Psychiatric:        Behavior: Behavior normal.     Procedures  Procedures  ED Course / MDM   Clinical Course as of 05/09/24 2359  Sun May 09, 2024  0855 Potassium(!): 6.0 No EKG changes, last dialysis was on Friday, will give Lokelma . Also given albuterol   [SG]  0855 Pt reports feeling somewhat better [SG]  1145 CT stable [SG]  1205 Troponin I (High Sensitivity)(!): 28 Delta flat, likely elev 2/2 ESRD [SG]  1211 Potassium(!!): 6.9 Rpt elev, give insulin /glucose, calcium , rpt EKG. Talk w/ nephro [SG]  1258 Spoke w/ dr Zana Hesselbach, plan for HD later today [SG]    Clinical Course User Index [SG] Teddi Favors, DO   Medical Decision Making Amount and/or Complexity of Data Reviewed Labs: ordered. Decision-making details documented in ED Course. Radiology: ordered.  Risk OTC drugs. Prescription drug management. Decision regarding hospitalization.   68 year old female seen earlier today by previous provider and sent to dialysis unit for emergent dialysis.  Initial complaint for shortness of breath.  Patient has returned from dialysis.  She states that she is no longer short of breath.  Her symptoms have improved.  She will be discharged home at this time and she was advised to follow-up with her PCP, continue taking all medications as prescribed.  She voiced understanding with instructions.  She is stable to discharge home.        Adel Aden,  PA-C 05/10/24 0000    Long, Joshua G, MD 05/10/24 [phone]

## 2024-05-09 NOTE — ED Notes (Signed)
 CCMD notified of patient

## 2024-05-09 NOTE — Discharge Instructions (Signed)
 It was a pleasure taking part in your care.  As discussed, please follow-up with your regular doctors.  Please return to the ED with any new or worsening symptoms.

## 2024-05-09 NOTE — Progress Notes (Signed)
 Pt arrived to HD unit for a HD tx.

## 2024-05-09 NOTE — ED Notes (Signed)
 Pt offered tylenol  for pain, pt refusing and requesting dilaudid  as well as to speak to MD. MD notified.

## 2024-05-09 NOTE — ED Provider Notes (Signed)
  EMERGENCY DEPARTMENT AT [facility] Provider Note  CSN: [id number] Arrival date & time: 05/09/24 4098  Chief Complaint(s) Chest Pain  HPI Whitney Walker is a 68 y.o. female with past medical history as below, significant for COPD, CHF, PE, htn PVD who presents to the ED with complaint of cp  Patient reports chest pain and dyspnea since yesterday evening.  Pain is left-sided, sharp, stabbing, nonradiating.  Somewhat worsened with deep inspiration.  No palpitations.  Does report worsening cough over the past 2448 hrs. with white phlegm.  No fevers.  Does have some chills.  No vomiting or nausea, no syncope or lightheadedness.  Compliant with home medications, last dialysis was on Friday, she is compliant Monday Wednesday Friday.  Past Medical History Past Medical History:  Diagnosis Date   Anemia    Anxiety    CHF (congestive heart failure) (HCC)    Colon cancer (HCC)    treatment surgery   Complication of anesthesia    after first C- [place] "couldnt walk after", patient denies having a spinal   COPD (chronic obstructive pulmonary disease) (HCC)    Coronary artery disease    Depression    DVT (deep venous thrombosis) (HCC)    Encounter for annual health examination 08/05/2023   ESRD on hemodialysis (HCC)    Hemo: MWF   History of blood transfusion 04/2018   Hypertension    07/07/18- no longer takes BP medications   Leucocytosis 04/28/2012   Meningitis    Pain in limb 07/30/2013   PE (pulmonary embolism)    Peripheral vascular disease (HCC)    Primary hypertension 11/15/2013   Restless legs    Shortness of breath    with exertion   Sleep apnea    SOB (shortness of breath) 03/03/2019   Vertigo    Patient Active Problem List   Diagnosis Date Noted   Encounter for Medicare annual wellness exam 04/12/2024   Closed fracture of proximal end of right humerus with routine healing 02/22/2024   Acute left-sided low back pain without sciatica 02/22/2024    Hyperkalemia 12/01/2023   Class 2 obesity with body mass index (BMI) of 37.0 to 37.9 in adult 11/25/2023   Rash 11/25/2023   Encounter for annual health examination 08/05/2023   History of smoking 10-25 pack years 08/05/2023   Malignant hypertension with heart failure and end-stage renal dis (HCC) 08/05/2023   Class 2 obesity due to excess calories with body mass index (BMI) of 36.0 to 36.9 in adult 08/05/2023   PAD (peripheral artery disease) (HCC) 08/05/2023   Vitamin D  deficiency 08/05/2023   Atherosclerosis of aorta (HCC) 07/29/2023   Muscle spasm of back 04/27/2023   Unspecified jaundice 03/05/2023   Pure hypercholesterolemia, unspecified 01/10/2023   Hypocalcemia 09/12/2022   Primary osteoarthritis of left hip 06/27/2022   Other chronic pain 06/27/2022   Hypertensive heart and kidney disease with heart failure and chronic kidney disease stage V (HCC) 06/27/2022   Left hip pain 05/16/2022   Tobacco abuse 05/16/2022   Orthostatic hypotension 05/16/2022   History of pulmonary embolism 05/01/2022   Protrusion of lumbar intervertebral disc 10/09/2021   Facet arthritis, degenerative, lumbar spine 10/09/2021   Nausea and vomiting    Abdominal pain    Polyarthritis    Polyarthralgia    Hyperkalemia, diminished renal excretion 05/28/2021   Generalized weakness 05/28/2021   Rash and nonspecific skin eruption 05/28/2021   Coronary artery calcification seen on CAT scan    Left leg  pain 05/09/2020   Restless leg syndrome 09/19/2018   OSA (obstructive sleep apnea) 04/29/2018   ESRD (end stage renal disease) on dialysis (HCC) 04/29/2018   Dyspnea on exertion 03/12/2017   Morbid (severe) obesity due to excess calories (HCC) 03/12/2017   Anemia of chronic kidney failure, stage 3 (moderate) (HCC) 12/10/2016   History of colon cancer 08/12/2016   Mass of left breast on mammogram 12/10/2015   Depression 10/18/2015   Numbness and tingling of left arm and leg 01/24/2015   PVD (peripheral  vascular disease) with claudication (HCC) 03/23/2014   Chronic diastolic heart failure (HCC) 11/15/2013   Home Medication(s) Prior to Admission medications   Medication Sig Start Date End Date Taking? Authorizing Provider  acetaminophen  (TYLENOL ) 650 MG CR tablet Take 1,950 mg by mouth every 8 (eight) hours as needed for pain.   Yes [provider]  albuterol  (VENTOLIN  HFA) 108 (90 Base) MCG/ACT inhaler Inhale 2 puffs into the lungs every 6 (six) hours as needed for wheezing or shortness of breath. 09/18/23  Yes Susanna Epley, FNP  atorvastatin  (LIPITOR) 20 MG tablet Take 1 tablet (20 mg total) by mouth daily. Patient taking differently: Take 20 mg by mouth at bedtime. 01/20/24 05/09/24 Yes Knox Perl, MD  Biotin  5 MG CAPS Take 5 mg by mouth daily at 6 (six) AM.   Yes [provider]  ELIQUIS  5 MG TABS tablet TAKE 1 TABLET (5 MG TOTAL) BY MOUTH 2 (TWO) TIMES DAILY. 04/20/24  Yes Susanna Epley, FNP  meclizine  (ANTIVERT ) 12.5 MG tablet Take 1 tablet (12.5 mg total) by mouth 3 (three) times daily as needed for dizziness. 09/18/23  Yes Susanna Epley, FNP  midodrine  (PROAMATINE ) 5 MG tablet Take 5 mg by mouth 2 (two) times daily as needed (hypotension). 12/03/23  Yes [provider]  multivitamin (RENA-VIT) TABS tablet Take 1 tablet by mouth at bedtime.   Yes [provider]  omeprazole  (PRILOSEC) 20 MG capsule TAKE 1 CAPSULE (20 MG TOTAL) BY MOUTH DAILY. 03/15/24  Yes Susanna Epley, FNP  sevelamer  carbonate (RENVELA ) 800 MG tablet Take 800 mg by mouth at bedtime. 10/27/23  Yes [provider]  sodium zirconium cyclosilicate  (LOKELMA ) 10 g PACK packet Take 10 g by mouth daily.   Yes [provider]  Tiotropium Bromide-Olodaterol (STIOLTO RESPIMAT ) 2.5-2.5 MCG/ACT AERS Inhale 2 puffs into the lungs daily. 05/04/24  Yes Hunsucker, Archer Kobs, MD  vitamin C  (ASCORBIC ACID ) 250 MG tablet Take 250 mg by mouth daily.   Yes [provider]  vitamin E 200  UNIT capsule Take 200 Units by mouth daily.   Yes [provider]                                                                                                                                    Past Surgical History Past Surgical History:  Procedure Laterality Date   ABDOMINAL AORTAGRAM N/A 07/26/2013  Procedure: ABDOMINAL AORTAGRAM;  Surgeon: Arvil Lauber, MD;  Location: [facility] CATH LAB;  Service: Cardiovascular;  Laterality: N/A;   ABDOMINAL HYSTERECTOMY     AV FISTULA PLACEMENT Left 05/05/2018   Procedure: ARTERIOVENOUS (AV) FISTULA CREATION BRACHIOCEPHALIC;  Surgeon: Adine Hoof, MD;  Location: [facility] OR;  Service: Vascular;  Laterality: Left;   AV FISTULA PLACEMENT Left 07/16/2018   Procedure: ARTERIOVENOUS FISTULA CREATION;  Surgeon: Adine Hoof, MD;  Location: [facility] OR;  Service: Vascular;  Laterality: Left;   BASCILIC VEIN TRANSPOSITION Left 09/03/2018   Procedure: BASILIC VEIN TRANSPOSITION SECOND STAGE;  Surgeon: Adine Hoof, MD;  Location: [facility] OR;  Service: Vascular;  Laterality: Left;   BREAST BIOPSY Left    CESAREAN SECTION     X 3 1974-1977   CHOLECYSTECTOMY     CHOLECYSTECTOMY  1980/s   COLECTOMY  2010   DIVERTICULOSIS SURGERY-2002  2012   FEMORAL-POPLITEAL BYPASS GRAFT Left 08/13/2013   Procedure: BYPASS GRAFT FEMORAL-POPLITEAL ARTERY WITH NON-REVERSED SAPHANEOUS VEIN; ULTRASOUND GUIDED;  Surgeon: Palma Bob, MD;  Location: [facility] OR;  Service: Vascular;  Laterality: Left;   FLEXIBLE SIGMOIDOSCOPY N/A 12/10/2022   Procedure: FLEXIBLE SIGMOIDOSCOPY;  Surgeon: Alvis Jourdain, MD;  Location: [facility];  Service: Gastroenterology;  Laterality: N/A;   INSERTION OF DIALYSIS CATHETER Right 04/30/2018   Procedure: INSERTION OF Right Internal Jugular DIALYSIS CATHETER;  Surgeon: Margherita Shell, MD;  Location: [facility] OR;  Service: Vascular;  Laterality: Right;   IR FLUORO GUIDE CV LINE LEFT  05/01/2022   IR FLUORO GUIDE CV LINE LEFT  05/21/2022   IR  FLUORO GUIDE CV LINE RIGHT  07/24/2020   IR FLUORO GUIDE CV LINE RIGHT  07/21/2021   IR FLUORO GUIDE CV LINE RIGHT  05/20/2022   IR PTA VENOUS EXCEPT DIALYSIS CIRCUIT  05/21/2022   IR REMOVAL TUN CV CATH W/O FL  07/21/2020   IR REMOVAL TUN CV CATH W/O FL  07/19/2021   IR REMOVAL TUN CV CATH W/O FL  05/17/2022   IR US  GUIDE VASC ACCESS LEFT  05/21/2022   IR US  GUIDE VASC ACCESS RIGHT  07/24/2020   IR US  GUIDE VASC ACCESS RIGHT  07/21/2021   IR US  GUIDE VASC ACCESS RIGHT  05/20/2022   IR VENOCAVAGRAM SVC  05/21/2022   LEFT HEART CATH AND CORONARY ANGIOGRAPHY N/A 05/01/2021   Procedure: LEFT HEART CATH AND CORONARY ANGIOGRAPHY;  Surgeon: Knox Perl, MD;  Location: [facility] INVASIVE CV LAB;  Service: Cardiovascular;  Laterality: N/A;   LEFT HEART CATH AND CORONARY ANGIOGRAPHY N/A 05/02/2022   Procedure: LEFT HEART CATH AND CORONARY ANGIOGRAPHY;  Surgeon: Knox Perl, MD;  Location: [facility] INVASIVE CV LAB;  Service: Cardiovascular;  Laterality: N/A;   LOWER EXTREMITY ANGIOGRAM Left 07/26/2013   Procedure: LOWER EXTREMITY ANGIOGRAM;  Surgeon: Arvil Lauber, MD;  Location: [facility] CATH LAB;  Service: Cardiovascular;  Laterality: Left;   Family History Family History  Problem Relation Age of Onset   Cancer Mother 26       breast and bone   Cancer Father 80       prostate   Hypertension Sister    Bleeding Disorder Sister    Hypertension Daughter    Cancer Cousin 20       breast cancer    Congestive Heart Failure Son    Breast cancer Neg Hx     Social History Social History   Tobacco Use   Smoking status: Some Days    Current packs/day: 0.00  Average packs/day: 0.5 packs/day for 50.0 years (25.0 ttl pk-yrs)    Types: Cigarettes    Start date: 10/13/1972    Last attempt to quit: 10/13/2022    Years since quitting: 1.5   Smokeless tobacco: Former   Tobacco comments:    She is using a vape when she runs out of her cigarettes, 7/27 - down to 1-2 cigs a day. 03/30/24 - less than 1-2 cigs a day  Vaping Use   Vaping  status: Every Day   Start date: 06/28/2020   Last attempt to quit: 05/20/2022   Devices: Vuse  Substance Use Topics   Alcohol use: Not Currently    Comment: she used to drink alcohol, quit in 2010; h/o heavy use    Drug use: Not Currently    Types: Oxycodone    Allergies Difelikefalin, Gadolinium derivatives, and Naltrexone  Review of Systems A thorough review of systems was obtained and all systems are negative except as noted in the HPI and PMH.   Physical Exam Vital Signs  I have reviewed the triage vital signs BP (!) 98/51   Pulse (!) 103   Temp 98 F (36.7 C) (Oral)   Resp 16   SpO2 96%  Physical Exam Vitals and nursing note reviewed.  Constitutional:      General: She is not in acute distress.    Appearance: Normal appearance. She is obese.  HENT:     Head: Normocephalic and atraumatic.     Right Ear: External ear normal.     Left Ear: External ear normal.     Nose: Nose normal.     Mouth/Throat:     Mouth: Mucous membranes are moist.  Eyes:     General: No scleral icterus.       Right eye: No discharge.        Left eye: No discharge.  Cardiovascular:     Rate and Rhythm: Normal rate and regular rhythm.     Pulses: Normal pulses.     Heart sounds: Normal heart sounds.  Pulmonary:     Effort: Pulmonary effort is normal. No respiratory distress.     Breath sounds: No stridor. Decreased breath sounds present.  Chest:    Abdominal:     General: Abdomen is flat. There is no distension.     Palpations: Abdomen is soft.     Tenderness: There is no abdominal tenderness.  Musculoskeletal:     Cervical back: No rigidity.     Right lower leg: No edema.     Left lower leg: No edema.  Skin:    General: Skin is warm and dry.     Capillary Refill: Capillary refill takes less than 2 seconds.  Neurological:     Mental Status: She is alert.  Psychiatric:        Mood and Affect: Mood normal.        Behavior: Behavior normal. Behavior is cooperative.     ED  Results and Treatments Labs (all labs ordered are listed, but only abnormal results are displayed) Labs Reviewed  BASIC METABOLIC PANEL WITH GFR - Abnormal; Notable for the following components:      Result Value   Sodium 134 (*)    Potassium 6.0 (*)    CO2 21 (*)    Glucose, Bld 106 (*)    BUN 33 (*)    Creatinine, Ser 8.84 (*)    Calcium  8.8 (*)    GFR, Estimated 4 (*)    All other  components within normal limits  CBC - Abnormal; Notable for the following components:   WBC 12.0 (*)    RDW 15.9 (*)    All other components within normal limits  BRAIN NATRIURETIC PEPTIDE - Abnormal; Notable for the following components:   B Natriuretic Peptide 147.9 (*)    All other components within normal limits  POTASSIUM - Abnormal; Notable for the following components:   Potassium 6.9 (*)    All other components within normal limits  CBG MONITORING, ED - Abnormal; Notable for the following components:   Glucose-Capillary 114 (*)    All other components within normal limits  TROPONIN I (HIGH SENSITIVITY) - Abnormal; Notable for the following components:   Troponin I (High Sensitivity) 27 (*)    All other components within normal limits  TROPONIN I (HIGH SENSITIVITY) - Abnormal; Notable for the following components:   Troponin I (High Sensitivity) 28 (*)    All other components within normal limits  HEPATITIS B SURFACE ANTIGEN  HEPATITIS B SURFACE ANTIBODY, QUANTITATIVE  CBG MONITORING, ED                                                                                                                          Radiology CT Chest Wo Contrast Result Date: 05/09/2024 CLINICAL DATA:  Chest pain. EXAM: CT CHEST WITHOUT CONTRAST TECHNIQUE: Multidetector CT imaging of the chest was performed following the standard protocol without IV contrast. RADIATION DOSE REDUCTION: This exam was performed according to the departmental dose-optimization program which includes automated exposure control,  adjustment of the mA and/or kV according to patient size and/or use of iterative reconstruction technique. COMPARISON:  Chest CT 08/12/2023 FINDINGS: Cardiovascular: The heart is upper limits of normal in size. No pericardial effusion. Stable tortuosity and age advanced atherosclerotic calcification involving the thoracic aorta. Stable left IJ dialysis catheter. Stable severe age advanced three-vessel coronary artery calcifications and stable mitral valve and aortic valve calcifications. Enlarged pulmonary arterial trunk suggesting pulmonary hypertension. Mediastinum/Nodes: Stable scattered borderline enlarged mediastinal and hilar lymph nodes likely reactive. No new or progressive findings. The esophagus is grossly. Lungs/Pleura: Underlying emphysematous changes and pulmonary scarring. Streaky areas of subpleural atelectasis. No pulmonary infiltrates or pleural effusions. No pulmonary edema. The central tracheobronchial tree is unremarkable. Upper Abdomen: No significant upper abdominal findings. Vascular calcifications noted. Musculoskeletal: No breast masses, supraclavicular or axillary adenopathy. The bony thorax is intact. IMPRESSION: 1. Underlying emphysematous changes and pulmonary scarring. 2. No acute pulmonary findings or pleural effusions. 3. Stable severe age advanced three-vessel coronary artery calcifications and mitral valve annular and aortic valve calcifications. 4. Enlarged pulmonary arterial trunk suggesting pulmonary hypertension. 5. Stable scattered borderline enlarged mediastinal and hilar lymph nodes, likely reactive. Aortic Atherosclerosis (ICD10-I70.0) and Emphysema (ICD10-J43.9). Electronically Signed   By: Marrian Siva M.D.   On: 05/09/2024 11:14   DG Chest Port 1 View Result Date: 05/09/2024 CLINICAL DATA:  Chest pain EXAM: PORTABLE CHEST 1 VIEW COMPARISON:  Prior CT scan of the  chest 08/12/2023 FINDINGS: Left IJ approach tunneled hemodialysis catheter in good position with the tip  overlying the mid atrium. Cardiomegaly. Extensive interstitial opacities throughout both lungs with fluid tracking along the minor fissure. Probable small bilateral pleural effusions. Central vascular engorgement. Findings are consistent with pulmonary edema. Atherosclerotic calcifications present in the aorta. No acute osseous abnormality. No pneumothorax. IMPRESSION: Mild to moderate CHF. Well-positioned left IJ tunneled hemodialysis catheter. Probable trace pleural effusions. Electronically Signed   By: Fernando Hoyer M.D.   On: 05/09/2024 08:09    Pertinent labs & imaging results that were available during my care of the patient were reviewed by me and considered in my medical decision making (see MDM for details).  Medications Ordered in ED Medications  lidocaine  (LIDODERM ) 5 % 1 patch (1 patch Transdermal Patch Applied 05/09/24 1242)  Chlorhexidine  Gluconate Cloth 2 % PADS 6 each (has no administration in time range)  morphine  (PF) 2 MG/ML injection 2 mg (2 mg Intravenous Given 05/09/24 0830)  ondansetron  (ZOFRAN ) injection 4 mg (4 mg Intravenous Given 05/09/24 0829)  ipratropium-albuterol  (DUONEB) 0.5-2.5 (3) MG/3ML nebulizer solution 3 mL (3 mLs Nebulization Given 05/09/24 0830)  sodium zirconium cyclosilicate  (LOKELMA ) packet 10 g (10 g Oral Given 05/09/24 0903)  morphine  (PF) 2 MG/ML injection 2 mg (2 mg Intravenous Given 05/09/24 1009)  acetaminophen  (TYLENOL ) tablet 1,000 mg (1,000 mg Oral Given 05/09/24 1244)  calcium  gluconate 1 g/ 50 mL sodium chloride  IVPB (0 mg Intravenous Stopped 05/09/24 1244)  insulin  aspart (novoLOG ) injection 5 Units (5 Units Intravenous Given 05/09/24 1242)    And  dextrose  50 % solution 50 mL (50 mLs Intravenous Given 05/09/24 1242)  ipratropium-albuterol  (DUONEB) 0.5-2.5 (3) MG/3ML nebulizer solution 3 mL (3 mLs Nebulization Given 05/09/24 1242)  HYDROmorphone  (DILAUDID ) injection 1 mg (1 mg Intravenous Given 05/09/24 1244)  alum & mag hydroxide-simeth  (MAALOX/MYLANTA) 200-200-20 MG/5ML suspension 30 mL (30 mLs Oral Given 05/09/24 1242)    And  lidocaine  (XYLOCAINE ) 2 % viscous mouth solution 15 mL (15 mLs Oral Given 05/09/24 1242)  methylPREDNISolone  sodium succinate  (SOLU-MEDROL ) 125 mg/2 mL injection 125 mg (125 mg Intravenous Given 05/09/24 1352)  iohexol  (OMNIPAQUE ) 350 MG/ML injection 75 mL (75 mLs Intravenous Contrast Given 05/09/24 1432)                                                                                                                                     Procedures .Critical Care  Performed by: Teddi Favors, DO Authorized by: Teddi Favors, DO   Critical care provider statement:    Critical care time (minutes):  30   Critical care time was exclusive of:  Separately billable procedures and treating other patients   Critical care was necessary to treat or prevent imminent or life-threatening deterioration of the following conditions:  Metabolic crisis   Critical care was time spent personally by me on the following activities:  Development of treatment  plan with patient or surrogate, discussions with consultants, evaluation of patient's response to treatment, examination of patient, ordering and review of laboratory studies, ordering and review of radiographic studies, ordering and performing treatments and interventions, pulse oximetry, re-evaluation of patient's condition, review of old charts and obtaining history from patient or surrogate   (including critical care time)  Medical Decision Making / ED Course    Medical Decision Making:    Renecia Shrode is a 68 y.o. female with past medical history as below, significant for COPD, CHF, PE, htn PVD who presents to the ED with complaint of cp. The complaint involves an extensive differential diagnosis and also carries with it a high risk of complications and morbidity.  Serious etiology was considered. Ddx includes but is not limited to: Differential includes all  life-threatening causes for chest pain. This includes but is not exclusive to acute coronary syndrome, aortic dissection, pulmonary embolism, cardiac tamponade, community-acquired pneumonia, pericarditis, musculoskeletal chest wall pain, etc.   Complete initial physical exam performed, notably the patient was in no distress, no hypoxia.    Reviewed and confirmed nursing documentation for past medical history, family history, social history.  Vital signs reviewed.     Brief summary:  68 year old female history as above including COPD, CHF, ESRD on HD here with left-sided chest pain No hypoxia, vital signs are stable on arrival, not requiring supplemental oxygen .  Clinical Course as of 05/09/24 1546  Sun May 09, 2024  0855 Potassium(!): 6.0 No EKG changes, last dialysis was on Friday, will give Lokelma . Also given albuterol   [SG]  0855 Pt reports feeling somewhat better [SG]  1145 CT stable [SG]  1205 Troponin I (High Sensitivity)(!): 28 Delta flat, likely elev 2/2 ESRD [SG]  1211 Potassium(!!): 6.9 Rpt elev, give insulin /glucose, calcium , rpt EKG. Talk w/ nephro [SG]  1258 Spoke w/ dr Zana Hesselbach, plan for HD later today [SG]    Clinical Course User Index [SG] Russella Courts A, DO     She is feeling somewhat better, chest pain improved.  Patient is having ongoing cough, difficulty breathing, given DuoNebs and Solu-Medrol .    She has prior PE, she is compliant with her Eliquis .  There is initially some concern for an iodine contrast allergy however after extensive discussion with the patient she reports that she has received IV contrast in the past without difficulty.  Denies ever having allergic reaction to IV contrast. Will get CT PE  Patient will require hemodialysis  Handoff incoming EDP pending CT            Additional history obtained: -Additional history obtained from family -External records from outside source obtained and reviewed including: Chart review including  previous notes, labs, imaging, consultation notes including  Home medications, prior labs and imaging   Lab Tests: -I ordered, reviewed, and interpreted labs.   The pertinent results include:   Labs Reviewed  BASIC METABOLIC PANEL WITH GFR - Abnormal; Notable for the following components:      Result Value   Sodium 134 (*)    Potassium 6.0 (*)    CO2 21 (*)    Glucose, Bld 106 (*)    BUN 33 (*)    Creatinine, Ser 8.84 (*)    Calcium  8.8 (*)    GFR, Estimated 4 (*)    All other components within normal limits  CBC - Abnormal; Notable for the following components:   WBC 12.0 (*)    RDW 15.9 (*)    All other components  within normal limits  BRAIN NATRIURETIC PEPTIDE - Abnormal; Notable for the following components:   B Natriuretic Peptide 147.9 (*)    All other components within normal limits  POTASSIUM - Abnormal; Notable for the following components:   Potassium 6.9 (*)    All other components within normal limits  CBG MONITORING, ED - Abnormal; Notable for the following components:   Glucose-Capillary 114 (*)    All other components within normal limits  TROPONIN I (HIGH SENSITIVITY) - Abnormal; Notable for the following components:   Troponin I (High Sensitivity) 27 (*)    All other components within normal limits  TROPONIN I (HIGH SENSITIVITY) - Abnormal; Notable for the following components:   Troponin I (High Sensitivity) 28 (*)    All other components within normal limits  HEPATITIS B SURFACE ANTIGEN  HEPATITIS B SURFACE ANTIBODY, QUANTITATIVE  CBG MONITORING, ED    Notable for hyperkalemia  EKG   EKG Interpretation Date/Time:  Sunday May 09 2024 12:26:49 EDT Ventricular Rate:  94 PR Interval:  200 QRS Duration:  94 QT Interval:  378 QTC Calculation: 473 R Axis:   226  Text Interpretation: Sinus rhythm Low voltage, precordial leads Probable RVH w/ secondary repol abnormality similar to prior earlier today Confirmed by Russella Courts ([id number]) on 05/09/2024 12:29:39  PM         Imaging Studies ordered: I ordered imaging studies including CT chest, x-ray I independently visualized the following imaging with scope of interpretation limited to determining acute life threatening conditions related to emergency care; findings noted above I agree with the radiologist interpretation If any imaging was obtained with contrast I closely monitored patient for any possible adverse reaction a/w contrast administration in the emergency department   Medicines ordered and prescription drug management: Meds ordered this encounter  Medications   morphine  (PF) 2 MG/ML injection 2 mg   ondansetron  (ZOFRAN ) injection 4 mg   ipratropium-albuterol  (DUONEB) 0.5-2.5 (3) MG/3ML nebulizer solution 3 mL   sodium zirconium cyclosilicate  (LOKELMA ) packet 10 g   morphine  (PF) 2 MG/ML injection 2 mg   acetaminophen  (TYLENOL ) tablet 1,000 mg   calcium  gluconate 1 g/ 50 mL sodium chloride  IVPB   AND Linked Order Group    insulin  aspart (novoLOG ) injection 5 Units    dextrose  50 % solution 50 mL   ipratropium-albuterol  (DUONEB) 0.5-2.5 (3) MG/3ML nebulizer solution 3 mL   HYDROmorphone  (DILAUDID ) injection 1 mg   AND Linked Order Group    alum & mag hydroxide-simeth (MAALOX/MYLANTA) 200-200-20 MG/5ML suspension 30 mL    lidocaine  (XYLOCAINE ) 2 % viscous mouth solution 15 mL   lidocaine  (LIDODERM ) 5 % 1 patch   methylPREDNISolone  sodium succinate  (SOLU-MEDROL ) 125 mg/2 mL injection 125 mg    IV methylprednisolone  will be converted to either a q12h or q24h frequency with the same total daily dose (TDD).  Ordered Dose: 1 to 125 mg TDD; convert to: TDD q24h.  Ordered Dose: 126 to 250 mg TDD; convert to: TDD div q12h.  Ordered Dose: >250 mg TDD; DAW.   iohexol  (OMNIPAQUE ) 350 MG/ML injection 75 mL   Chlorhexidine  Gluconate Cloth 2 % PADS 6 each    -I have reviewed the patients home medicines and have made adjustments as needed   Consultations Obtained: I requested  consultation with the nephrology,  and discussed lab and imaging findings as well as pertinent plan - they recommend: Dialysis   Cardiac Monitoring: The patient was maintained on a cardiac monitor.  I personally viewed and  interpreted the cardiac monitored which showed an underlying rhythm of: nsr Continuous pulse oximetry interpreted by myself, 95% on RA.    Social Determinants of Health:  Diagnosis or treatment significantly limited by social determinants of health: current smoker Counseled patient for approximately 3 minutes regarding smoking cessation. Discussed risks of smoking and how they applied and affected their visit here today.    CPT code: 16109: intermediate counseling for smoking cessation     Reevaluation: After the interventions noted above, I reevaluated the patient and found that they have improved  Co morbidities that complicate the patient evaluation  Past Medical History:  Diagnosis Date   Anemia    Anxiety    CHF (congestive heart failure) (HCC)    Colon cancer (HCC)    treatment surgery   Complication of anesthesia    after first [facility]- [place] "couldnt walk after", patient denies having a spinal   COPD (chronic obstructive pulmonary disease) (HCC)    Coronary artery disease    Depression    DVT (deep venous thrombosis) (HCC)    Encounter for annual health examination 08/05/2023   ESRD on hemodialysis (HCC)    Hemo: MWF   History of blood transfusion 04/2018   Hypertension    07/07/18- no longer takes BP medications   Leucocytosis 04/28/2012   Meningitis    Pain in limb 07/30/2013   PE (pulmonary embolism)    Peripheral vascular disease (HCC)    Primary hypertension 11/15/2013   Restless legs    Shortness of breath    with exertion   Sleep apnea    SOB (shortness of breath) 03/03/2019   Vertigo       Dispostion: Disposition decision including need for hospitalization was considered, and patient disposition pending at time of sign  out.    Final Clinical Impression(s) / ED Diagnoses Final diagnoses:  ESRD on hemodialysis (HCC)  Hyperkalemia  COPD exacerbation (HCC)        Teddi Favors, DO 05/09/24 1546

## 2024-05-09 NOTE — ED Triage Notes (Signed)
 PT BIB EMS for left sided chest pain/side pain, started last night, doesn't radiate.  140/70 SR  HR 70 98% RA CBG 131

## 2024-05-09 NOTE — Procedures (Signed)
 Asked to see patient for hospital dialysis.  Patient is ESRD on hemodialysis who presented with left-sided chest pain which started last night.  Vital signs were stable.  Patient history of COPD, CHF, PE, hypertension, PVD.  Patient also complained of worsening cough over the last 1 to 2 days with white phlegm.  No fevers, has been having some chills and bodyaches.  No nausea vomiting or diarrhea.  Has been compliant with her home meds.  Last dialysis was on Friday, schedule is MWF.  Patient was seen in ED room.  She is wearing nasal cannula oxygen , does not have oxygen  at home.  Exam showed clear lungs, trace edema in the legs, abdomen negative, wearing nasal cannula oxygen  although the record says she has been on room air since she arrived.  Labs showed a potassium 6.0, creatinine 8.8, BNP 148, troponin 27, WBC 12K, hemoglobin 12.6.  Patient received Lokelma , Zofran , morphine , Solu-Medrol  IV, albuterol  DuoNeb, IV Dilaudid , insulin  and glucose for hyperkalemia, calcium  gluconate 1 g IV.  After the event initial temporizing measures repeat potassium was worse at 6.9.  Will need HD tonight w/ low K+ bath. Our service was asked to see the patient for dialysis.     [facility] 3.5h   B400   89.5kg  [facility] TDC   Heparin  6000 Last OP HD 5/09, post wt 89.3kg Has been getting to dry wt, good compliance     The plan will be for "ED HD". Pt is not to be admitted at this time. Pt will go to the dialysis unit when they are ready for the patient. When dialysis is completed pt will be sent back to ED for reassessment. Low K+ bath will be needed.    I was present at the procedure, reviewed the HD regimen and made appropriate changes.   Larry Poag MD  [facility] 05/09/2024, 8:12 PM

## 2024-05-09 NOTE — ED Provider Notes (Signed)
 68 yo female here for shortness of breath Pending CT PE study ESRD hasn't  missed dialysis K elevated, treated in ED  Pending CT PE study after iodine allergy treatment  Nephrologist notified - plan for dialysis today, if symptoms improved can be discharged after from ED, but if still SOB may need admission afterwards  Physical Exam  BP (!) 98/51   Pulse (!) 111   Temp 98 F (36.7 C) (Oral)   Resp 10   SpO2 91%   Physical Exam  Procedures  Procedures  ED Course / MDM   Clinical Course as of 05/09/24 1514  Sun May 09, 2024  0855 Potassium(!): 6.0 No EKG changes, last dialysis was on Friday, will give Lokelma . Also given albuterol   [SG]  0855 Pt reports feeling somewhat better [SG]  1145 CT stable [SG]  1205 Troponin I (High Sensitivity)(!): 28 Delta flat, likely elev 2/2 ESRD [SG]  1211 Potassium(!!): 6.9 Rpt elev, give insulin /glucose, calcium , rpt EKG. Talk w/ nephro [SG]  1258 Spoke w/ dr Zana Hesselbach, plan for HD later today [SG]    Clinical Course User Index [SG] Teddi Favors, DO   Medical Decision Making Amount and/or Complexity of Data Reviewed Labs: ordered. Decision-making details documented in ED Course. Radiology: ordered.  Risk OTC drugs. Prescription drug management. Decision regarding hospitalization.   CT PE study with no acute PE.  Patient is pending dialysis and likely return to ED afterwards; if improved could be discharged.         Arvilla Birmingham, MD 05/09/24 [id number]

## 2024-05-10 NOTE — Progress Notes (Signed)
 Received patient in bed to unit.  Alert and oriented.  Informed consent signed and in chart.   TX duration:  Patient tolerated well.  Transported back to the room  Alert, without acute distress.  Hand-off given to patient's nurse.   Access used: catheter Access issues: none  Total UF removed: 1000 ml Medication(s) given:  dilaudid  Post HD VS: 115/72 Post HD weight: unable to obtain   05/09/24 2332  Vitals  Temp 98.1 F (36.7 C)  Temp Source Oral  BP 115/72  MAP (mmHg) 84  BP Location Left Arm  BP Method Automatic  Patient Position (if appropriate) Lying  Pulse Rate (!) 113  Pulse Rate Source Monitor  ECG Heart Rate (!) 112  Resp 14  Oxygen  Therapy  SpO2 93 %  O2 Device Room Air  Patient Activity (if Appropriate) In bed  Pulse Oximetry Type Continuous  During Treatment Monitoring  Blood Flow Rate (mL/min) 0 mL/min  Arterial Pressure (mmHg) 38.79 mmHg  Venous Pressure (mmHg) -30.91 mmHg  TMP (mmHg) 17.37 mmHg  Ultrafiltration Rate (mL/min) 0 mL/min  Dialysate Flow Rate (mL/min) 300 ml/min  Dialysate Potassium Concentration 2  Dialysate Calcium  Concentration 2.5  Duration of HD Treatment -hour(s) 3 hour(s)  Cumulative Fluid Removed (mL) per Treatment  1031.99  HD Safety Checks Performed Yes  Post Treatment  Dialyzer Clearance Lightly streaked  Hemodialysis Intake (mL) 0 mL  Liters Processed 66.9  Fluid Removed (mL) 1000 mL  Tolerated HD Treatment No (Comment)  Post-Hemodialysis Comments HD tx successfully completed, UF gaol not obtaned due to pt being hypotensive. pt is stable.  AVG/AVF Arterial Site Held (minutes) 0 minutes  AVG/AVF Venous Site Held (minutes) 0 minutes  Note  Patient Observations pt is on the stretcher resting  Hemodialysis Catheter Left Internal jugular Double lumen Permanent (Tunneled)  Placement Date/Time: 05/21/22 1550   Time Out: Correct patient;Correct site;Correct procedure  Maximum sterile barrier precautions: Hand  hygiene;Cap;Mask;Sterile gloves;Sterile gown;Large sterile sheet  Site Prep: Chlorhexidine  (preferred);Skin Prep C...  Site Condition No complications  Blue Lumen Status Heparin  locked;Dead end cap in place  Red Lumen Status Flushed;Heparin  locked;Dead end cap in place  Purple Lumen Status N/A  Catheter fill solution Heparin  1000 units/ml  Catheter fill volume (Arterial) 2.1 cc  Catheter fill volume (Venous) 2.1  Dressing Type Transparent  Dressing Status Antimicrobial disc/dressing in place  Drainage Description None  Post treatment catheter status Capped and Clamped      [facility]

## 2024-05-11 LAB — HEPATITIS B SURFACE ANTIBODY, QUANTITATIVE: Hep B S AB Quant (Post): 7523 m[IU]/mL

## 2024-05-12 ENCOUNTER — Emergency Department (HOSPITAL_COMMUNITY)

## 2024-05-12 ENCOUNTER — Other Ambulatory Visit: Payer: Self-pay

## 2024-05-12 ENCOUNTER — Emergency Department (HOSPITAL_COMMUNITY)
Admission: EM | Admit: 2024-05-12 | Discharge: 2024-05-12 | Disposition: A | Discharge status: Home / Self Care | Attending: Emergency Medicine | Admitting: Emergency Medicine

## 2024-05-12 ENCOUNTER — Encounter (HOSPITAL_COMMUNITY): Payer: Self-pay

## 2024-05-12 DIAGNOSIS — I132 Hypertensive heart and chronic kidney disease with heart failure and with stage 5 chronic kidney disease, or end stage renal disease: Secondary | ICD-10-CM | POA: Insufficient documentation

## 2024-05-12 DIAGNOSIS — N2581 Secondary hyperparathyroidism of renal origin: Secondary | ICD-10-CM | POA: Diagnosis not present

## 2024-05-12 DIAGNOSIS — R062 Wheezing: Secondary | ICD-10-CM | POA: Insufficient documentation

## 2024-05-12 DIAGNOSIS — J449 Chronic obstructive pulmonary disease, unspecified: Secondary | ICD-10-CM | POA: Diagnosis not present

## 2024-05-12 DIAGNOSIS — D509 Iron deficiency anemia, unspecified: Secondary | ICD-10-CM | POA: Diagnosis not present

## 2024-05-12 DIAGNOSIS — D689 Coagulation defect, unspecified: Secondary | ICD-10-CM | POA: Diagnosis not present

## 2024-05-12 DIAGNOSIS — I120 Hypertensive chronic kidney disease with stage 5 chronic kidney disease or end stage renal disease: Secondary | ICD-10-CM | POA: Diagnosis not present

## 2024-05-12 DIAGNOSIS — I509 Heart failure, unspecified: Secondary | ICD-10-CM | POA: Diagnosis not present

## 2024-05-12 DIAGNOSIS — R6889 Other general symptoms and signs: Secondary | ICD-10-CM | POA: Diagnosis not present

## 2024-05-12 DIAGNOSIS — Z992 Dependence on renal dialysis: Secondary | ICD-10-CM | POA: Insufficient documentation

## 2024-05-12 DIAGNOSIS — Z79899 Other long term (current) drug therapy: Secondary | ICD-10-CM | POA: Diagnosis not present

## 2024-05-12 DIAGNOSIS — I517 Cardiomegaly: Secondary | ICD-10-CM | POA: Diagnosis not present

## 2024-05-12 DIAGNOSIS — N186 End stage renal disease: Secondary | ICD-10-CM | POA: Insufficient documentation

## 2024-05-12 DIAGNOSIS — E875 Hyperkalemia: Secondary | ICD-10-CM | POA: Diagnosis not present

## 2024-05-12 DIAGNOSIS — R0789 Other chest pain: Secondary | ICD-10-CM | POA: Insufficient documentation

## 2024-05-12 DIAGNOSIS — R079 Chest pain, unspecified: Secondary | ICD-10-CM | POA: Diagnosis not present

## 2024-05-12 DIAGNOSIS — Z7901 Long term (current) use of anticoagulants: Secondary | ICD-10-CM | POA: Diagnosis not present

## 2024-05-12 DIAGNOSIS — J9811 Atelectasis: Secondary | ICD-10-CM | POA: Diagnosis not present

## 2024-05-12 DIAGNOSIS — R918 Other nonspecific abnormal finding of lung field: Secondary | ICD-10-CM | POA: Diagnosis not present

## 2024-05-12 DIAGNOSIS — D631 Anemia in chronic kidney disease: Secondary | ICD-10-CM | POA: Diagnosis not present

## 2024-05-12 LAB — TROPONIN I (HIGH SENSITIVITY)
Troponin I (High Sensitivity): 27 ng/L — ABNORMAL HIGH (ref <18)
Troponin I (High Sensitivity): 22 ng/L — ABNORMAL HIGH (ref <18)

## 2024-05-12 LAB — BASIC METABOLIC PANEL WITH GFR
Anion gap: 18 — ABNORMAL HIGH (ref 5–15)
BUN: 59 mg/dL — ABNORMAL HIGH (ref 8–23)
CO2: 22 mmol/L (ref 22–32)
Calcium: 8.6 mg/dL — ABNORMAL LOW (ref 8.9–10.3)
Chloride: 93 mmol/L — ABNORMAL LOW (ref 98–111)
Creatinine, Ser: 10.79 mg/dL — ABNORMAL HIGH (ref 0.44–1.00)
GFR, Estimated: 4 mL/min — ABNORMAL LOW (ref >60)
Glucose, Bld: 119 mg/dL — ABNORMAL HIGH (ref 70–99)
Potassium: 4.9 mmol/L (ref 3.5–5.1)
Sodium: 133 mmol/L — ABNORMAL LOW (ref 135–145)

## 2024-05-12 LAB — BRAIN NATRIURETIC PEPTIDE: B Natriuretic Peptide: 127.2 pg/mL — ABNORMAL HIGH (ref 0.0–100.0)

## 2024-05-12 LAB — CBC WITH DIFFERENTIAL/PLATELET
Abs Immature Granulocytes: 0.06 10*3/uL (ref 0.00–0.07)
Basophils Absolute: 0.1 10*3/uL (ref 0.0–0.1)
Basophils Relative: 0 %
Eosinophils Absolute: 0.1 10*3/uL (ref 0.0–0.5)
Eosinophils Relative: 1 %
HCT: 37.8 % (ref 36.0–46.0)
Hemoglobin: 12 g/dL (ref 12.0–15.0)
Immature Granulocytes: 1 %
Lymphocytes Relative: 19 %
Lymphs Abs: 2.5 10*3/uL (ref 0.7–4.0)
MCH: 29.5 pg (ref 26.0–34.0)
MCHC: 31.7 g/dL (ref 30.0–36.0)
MCV: 92.9 fL (ref 80.0–100.0)
Monocytes Absolute: 0.8 10*3/uL (ref 0.1–1.0)
Monocytes Relative: 6 %
Neutro Abs: 9.4 10*3/uL — ABNORMAL HIGH (ref 1.7–7.7)
Neutrophils Relative %: 73 %
Platelets: 203 10*3/uL (ref 150–400)
RBC: 4.07 MIL/uL (ref 3.87–5.11)
RDW: 15.9 % — ABNORMAL HIGH (ref 11.5–15.5)
WBC: 12.8 10*3/uL — ABNORMAL HIGH (ref 4.0–10.5)
nRBC: 0 % (ref 0.0–0.2)

## 2024-05-12 LAB — HEPATIC FUNCTION PANEL
ALT: 27 U/L (ref 0–44)
AST: 25 U/L (ref 15–41)
Albumin: 3.4 g/dL — ABNORMAL LOW (ref 3.5–5.0)
Alkaline Phosphatase: 109 U/L (ref 38–126)
Bilirubin, Direct: <0.1 mg/dL (ref 0.0–0.2)
Total Bilirubin: 0.7 mg/dL (ref 0.0–1.2)
Total Protein: 7.8 g/dL (ref 6.5–8.1)

## 2024-05-12 LAB — LIPASE, BLOOD: Lipase: 27 U/L (ref 11–51)

## 2024-05-12 LAB — MAGNESIUM: Magnesium: 1.9 mg/dL (ref 1.7–2.4)

## 2024-05-12 MED ORDER — IPRATROPIUM-ALBUTEROL 0.5-2.5 (3) MG/3ML IN SOLN
3.0000 mL | Freq: Once | RESPIRATORY_TRACT | Status: AC
  Administered 2024-05-12: 3 mL via RESPIRATORY_TRACT
  Filled 2024-05-12: qty 3

## 2024-05-12 MED ORDER — LIDOCAINE 5 % EX PTCH
1.0000 | MEDICATED_PATCH | CUTANEOUS | Status: DC
  Administered 2024-05-12: 1 via TRANSDERMAL
  Filled 2024-05-12: qty 1

## 2024-05-12 MED ORDER — HYDROMORPHONE HCL 1 MG/ML IJ SOLN
0.5000 mg | Freq: Once | INTRAMUSCULAR | Status: AC
  Administered 2024-05-12: 0.5 mg via INTRAVENOUS
  Filled 2024-05-12: qty 1

## 2024-05-12 MED ORDER — IPRATROPIUM-ALBUTEROL 0.5-2.5 (3) MG/3ML IN SOLN
3.0000 mL | Freq: Once | RESPIRATORY_TRACT | Status: AC
Start: 2024-05-12 — End: 2024-05-12
  Administered 2024-05-12: 3 mL via RESPIRATORY_TRACT
  Filled 2024-05-12: qty 3

## 2024-05-12 MED ORDER — FENTANYL CITRATE PF 50 MCG/ML IJ SOSY
50.0000 ug | PREFILLED_SYRINGE | INTRAMUSCULAR | Status: DC | PRN
  Administered 2024-05-12: 50 ug via INTRAVENOUS
  Filled 2024-05-12: qty 1

## 2024-05-12 NOTE — Discharge Instructions (Addendum)
 You were seen in the ER for your chest pain. This seems to be muscular. You can take Tylenol  every 6 hours as needed for pain. You can also try topical lidocaine  patches as well. Please see your PCP as you may need to be referred to pain management. You can use your albuterol  inhaler to help with a cough. Please make sure to adhere to your dialysis schedule. If you have any concerns, new or worsening symptoms, please return to the ER for re-evaluation.   Contact a health care provider if: You have a fever. Your chest pain becomes worse. You have new symptoms. Get help right away if: You have nausea or vomiting. You feel sweaty or light-headed. You have a cough with mucus from your lungs (sputum) or you cough up blood. You develop shortness of breath. These symptoms may represent a serious problem that is an emergency. Do not wait to see if the symptoms will go away. Get medical help right away. Call your local emergency services (911 in the [place] Do not drive yourself to the hospital.

## 2024-05-12 NOTE — ED Triage Notes (Signed)
 PT was brought in by EMS, PT was at dialysis this morning and states she was having 10/10 chest pain. PT states pain has been going on for 3 days. PT did not get dialysis. PT A&O x4 abd VSS. PT in no distress.

## 2024-05-12 NOTE — Progress Notes (Signed)
 Contacted by ED PA regarding pt needing a HD treatment today if possible. Contacted [facility] and was advised that clinic can treat pt this afternoon if pt can arrive by 1:00 pm. Pt's treatment will likely only be around 3 hrs if arrives at 1:00 pm. This info was provided to ED PA and nephrologist. PA spoke to pt regarding the above. Plan is for pt to be sent by cab to clinic for HD treatment and pt advised PA that daughter can transport pt home from HD. Contacted [facility] clinic Production designer, theatre[employer]television[employer]film and left message to be made aware of the above plans.   Lauraine Polite Renal Navigator [phone]

## 2024-05-12 NOTE — ED Notes (Signed)
 Pt understood d/c instructions and when to return to the ED. IV d/c and VS updated. Pt placed in wheelchair and transported to the lobby where she will wait for another transport for dialysis

## 2024-05-12 NOTE — ED Provider Notes (Signed)
 [facility] Provider Note   CSN: [id number] Arrival date & time: 05/12/24  4098     History Chief Complaint  Patient presents with   Chest Pain    Whitney Walker is a 68 y.o. female history of COPD, CHF, PE on Eliquis , hypertension, ESRD presents emerged from today for evaluation of left-sided chest pain that has been present since Sunday when she was last seen in the ER.  Reports it has been unchanged but has not improved.  Worse with movement and with coughing.  She reports that she does have shortness of breath however is at her baseline with her COPD.  She has a nonproductive cough but again reports that her baseline.  Denies any rhinorrhea, nasal ejection, fever, Donnell pain, nausea, vomiting, diarrhea, constipation.  She does not make any urine while on dialysis.  Has tried some Tylenol  without much relief of her symptoms.  The pain is nonradiating and scribed as sharp.  She denies any trauma to the area.  She reports compliancy with her Eliquis .  She has not followed up since being seen in the ER previously.  She was at dialysis today however was complained about chest pain and was sent to the emergency department.   Chest Pain Associated symptoms: cough and shortness of breath (At baseline)   Associated symptoms: no abdominal pain, no fever, no nausea, no palpitations and no vomiting        Home Medications Prior to Admission medications   Medication Sig Start Date End Date Taking? Authorizing Provider  acetaminophen  (TYLENOL ) 650 MG CR tablet Take 1,950 mg by mouth every 8 (eight) hours as needed for pain.    [provider]  albuterol  (VENTOLIN  HFA) 108 (90 Base) MCG/ACT inhaler Inhale 2 puffs into the lungs every 6 (six) hours as needed for wheezing or shortness of breath. 09/18/23   Susanna Epley, FNP  atorvastatin  (LIPITOR) 20 MG tablet Take 1 tablet (20 mg total) by mouth daily. Patient taking differently: Take 20  mg by mouth at bedtime. 01/20/24 05/09/24  Knox Perl, MD  Biotin  5 MG CAPS Take 5 mg by mouth daily at 6 (six) AM.    [provider]  ELIQUIS  5 MG TABS tablet TAKE 1 TABLET (5 MG TOTAL) BY MOUTH 2 (TWO) TIMES DAILY. 04/20/24   Moore, Janece, FNP  meclizine  (ANTIVERT ) 12.5 MG tablet Take 1 tablet (12.5 mg total) by mouth 3 (three) times daily as needed for dizziness. 09/18/23   Susanna Epley, FNP  midodrine  (PROAMATINE ) 5 MG tablet Take 5 mg by mouth 2 (two) times daily as needed (hypotension). 12/03/23   [provider]  multivitamin (RENA-VIT) TABS tablet Take 1 tablet by mouth at bedtime.    [provider]  omeprazole  (PRILOSEC) 20 MG capsule TAKE 1 CAPSULE (20 MG TOTAL) BY MOUTH DAILY. 03/15/24   Susanna Epley, FNP  sevelamer  carbonate (RENVELA ) 800 MG tablet Take 800 mg by mouth at bedtime. 10/27/23   [provider]  sodium zirconium cyclosilicate  (LOKELMA ) 10 g PACK packet Take 10 g by mouth daily.    [provider]  Tiotropium Bromide-Olodaterol (STIOLTO RESPIMAT ) 2.5-2.5 MCG/ACT AERS Inhale 2 puffs into the lungs daily. 05/04/24   Hunsucker, Archer Kobs, MD  vitamin C  (ASCORBIC ACID ) 250 MG tablet Take 250 mg by mouth daily.    [provider]  vitamin E 200 UNIT capsule Take 200 Units by mouth daily.    [provider]  Allergies    Difelikefalin, Gadolinium derivatives, and Naltrexone    Review of Systems   Review of Systems  Constitutional:  Negative for chills and fever.  HENT:  Negative for congestion and rhinorrhea.   Respiratory:  Positive for cough and shortness of breath (At baseline).   Cardiovascular:  Positive for chest pain. Negative for palpitations and leg swelling.  Gastrointestinal:  Negative for abdominal pain, constipation, diarrhea, nausea and vomiting.  Neurological:  Negative for syncope and light-headedness.    Physical Exam Updated Vital Signs BP 139/68   Pulse 94   Temp 98.1 F (36.7 C)    Resp 15   SpO2 93%  Physical Exam Vitals and nursing note reviewed.  Constitutional:      General: She is not in acute distress.    Appearance: She is not toxic-appearing or diaphoretic.  Cardiovascular:     Rate and Rhythm: Normal rate.     Pulses:          Radial pulses are 2+ on the right side and 2+ on the left side.       Dorsalis pedis pulses are 2+ on the right side and 2+ on the left side.       Posterior tibial pulses are 2+ on the right side and 2+ on the left side.  Pulmonary:     Effort: Pulmonary effort is normal.     Breath sounds: Wheezing present. No decreased breath sounds.     Comments: Bilateral fine expiratory end wheezing.  Speaking in full sentences, no acute respiratory distress. Chest:     Chest wall: Tenderness present.     Comments: Significant tenderness upon palpation to the patient's left-sided chest.  No overlying skin changes noted.  No rash.  She does have dialysis line present in the left upper chest however there is no surrounding erythema or increase in warmth.  No swelling or induration or fluctuance.  Bandage appears intact and clean. Abdominal:     Palpations: Abdomen is soft.     Tenderness: There is no abdominal tenderness. There is no guarding or rebound.  Musculoskeletal:     Right lower leg: No tenderness. No edema.     Left lower leg: No tenderness. No edema.  Skin:    General: Skin is warm and dry.  Neurological:     Mental Status: She is alert.     ED Results / Procedures / Treatments   Labs (all labs ordered are listed, but only abnormal results are displayed) Labs Reviewed  BASIC METABOLIC PANEL WITH GFR - Abnormal; Notable for the following components:      Result Value   Sodium 133 (*)    Chloride 93 (*)    Glucose, Bld 119 (*)    BUN 59 (*)    Creatinine, Ser 10.79 (*)    Calcium  8.6 (*)    GFR, Estimated 4 (*)    Anion gap 18 (*)    All other components within normal limits  CBC WITH DIFFERENTIAL/PLATELET - Abnormal;  Notable for the following components:   WBC 12.8 (*)    RDW 15.9 (*)    Neutro Abs 9.4 (*)    All other components within normal limits  HEPATIC FUNCTION PANEL - Abnormal; Notable for the following components:   Albumin  3.4 (*)    All other components within normal limits  TROPONIN I (HIGH SENSITIVITY) - Abnormal; Notable for the following components:   Troponin I (High Sensitivity) 27 (*)    All  other components within normal limits  MAGNESIUM   LIPASE, BLOOD  BRAIN NATRIURETIC PEPTIDE  CBG MONITORING, ED  I-STAT CHEM 8, ED  TROPONIN I (HIGH SENSITIVITY)    EKG EKG Interpretation Date/Time:  Wednesday May 12 2024 06:28:03 EDT Ventricular Rate:  99 PR Interval:  194 QRS Duration:  85 QT Interval:  358 QTC Calculation: 460 R Axis:   77  Text Interpretation: Sinus rhythm Low voltage, precordial leads Confirmed by Iva Mariner [phone]) on 05/12/2024 6:32:37 AM  Radiology DG Chest Portable 1 View Result Date: 05/12/2024 CLINICAL DATA:  Left chest pain. EXAM: PORTABLE CHEST 1 VIEW COMPARISON:  CTA chest 05/09/2024. FINDINGS: Left IJ dialysis line again terminates in the right atrium. There is mild cardiomegaly. Central vessels are prominent as before without evidence of pulmonary edema. No pleural effusion is seen. There is increased linear atelectasis along the horizontal fissure on the right. Linear scarring or atelectasis left base. No focal pneumonia is evident. Stable mediastinum with aortic tortuosity and atherosclerosis. No new osseous abnormality. IMPRESSION: 1. Increased linear atelectasis along the horizontal fissure on the right. 2. Linear scarring or atelectasis left base. No pneumonic infiltrates. 3. Cardiomegaly and central vascular prominence without evidence of pulmonary edema. 4. Aortic atherosclerosis. Electronically Signed   By: Denman Fischer M.D.   On: 05/12/2024 07:26    Procedures Procedures   Medications Ordered in ED Medications  HYDROmorphone  (DILAUDID )  injection 0.5 mg (0.5 mg Intravenous Given 05/12/24 0836)  ipratropium-albuterol  (DUONEB) 0.5-2.5 (3) MG/3ML nebulizer solution 3 mL (3 mLs Nebulization Given 05/12/24 0809)  ipratropium-albuterol  (DUONEB) 0.5-2.5 (3) MG/3ML nebulizer solution 3 mL (3 mLs Nebulization Given 05/12/24 1153)  HYDROmorphone  (DILAUDID ) injection 0.5 mg (0.5 mg Intravenous Given 05/12/24 1153)    ED Course/ Medical Decision Making/ A&P Clinical Course as of 05/16/24 2257  Wed May 12, 2024  1106 Talked with Dr. Zana Hesselbach. Does not need dialysis today. Will work with dialysis coordinator to get her in for outpatient session tomorrow.  [RR]    Clinical Course User Index [RR] Spence Dux, PA-C                               Medical Decision Making Risk Prescription drug management.  68 y.o. female presents to the ER for evaluation of chest wall pain. Differential diagnosis includes but is not limited to ACS, pericarditis, myocarditis, aortic dissection, PE, pneumothorax, esophageal rupture, pneumonia, reflux/PUD, biliary disease, pancreatitis, costochondritis, anxiety. Vital signs unremarkable. Physical exam as noted above.   On previous chart evaluation, patient was recently just seen on 05-09-2024 for similar presentation with unremarkable workup.  I independently reviewed and interpreted the patient's labs.  CBC shows white count of 12.8 with a slightly elevated neutrophil count at 9.4, nonspecific, no anemia.  Count does appear at baseline for patient's previous labs.  BMP with a sodium 133, chloride 93, glucose at 119, BUN 59 with a creatinine 10.79.  Calcium  8.6 with a GFR 4 and anion gap of 18, patient was post to receive dialysis today.  Magnesium  1.9, lipase of 27.  Hepatic function panel with a albumin  of 3.4 otherwise no other LFT abnormality.  BNP slightly elevated at 127.2 however less than it was last week when she presented for similar symptoms.  Troponin at 27 with repeat at 22, consistent with patient's  previous troponins.  CXR shows 1. Increased linear atelectasis along the horizontal fissure on the right. 2. Linear scarring or atelectasis left base.  No pneumonic infiltrates. 3. Cardiomegaly and central vascular prominence without evidence of pulmonary edema. 4. Aortic atherosclerosis. Per radiologist's interpretation.    EKG reviewed and interpreted by an attending and read as sinus rhythm Low voltage, precordial leads.   [name] chest pain is reproducible and is chest wall tenderness.  She is having some cough reports is chronic for her with her COPD.  She is not hypoxic and is speaking in full sentences.  Do feel like there is some pain seeking behavior for this as it is chest wall tenderness.  Her pain is reproducible upon palpation and does not have any exertional factors.  I doubt any PE or ACS given reassuring workup.  Recommend she follow with her PCP and pain management.  Do not feel at this is cardiac in nature.  She does have dialysis catheter present to left chest.  This is well-appearing with no surrounding erythema or swelling.  I spoke with Dr. Zana Hesselbach who does not think patient needs emergent dialysis.  Was able to coordinate with Lauraine Polite, dialysis coordinator for scheduling.  We were able to get patient a ride to dialysis.  Patient reports that her daughter will be out to pick her up so she is able to get dialysis today.  She is stable for discharge home.  We discussed the results of the labs/imaging. The plan is follow up with PCP for pain management. We discussed strict return precautions and red flag symptoms. The patient verbalized their understanding and agrees to the plan. The patient is stable and being discharged home in good condition.  I discussed this case with my attending physician who cosigned this note including patient's presenting symptoms, physical exam, and planned diagnostics and interventions. Attending physician stated agreement with plan or made changes to  plan which were implemented.   Attending physician assessed patient at bedside.  Portions of this report may have been transcribed using voice recognition software. Every effort was made to ensure accuracy; however, inadvertent computerized transcription errors may be present.    Final Clinical Impression(s) / ED Diagnoses Final diagnoses:  Chest wall pain  ESRD (end stage renal disease) [facility])    Rx / DC Orders ED Discharge Orders     None         Spence Dux, PA-C 05/16/24 2309    Kingsley, Victoria K, DO 05/17/24 1457

## 2024-05-14 ENCOUNTER — Ambulatory Visit (HOSPITAL_COMMUNITY)
Admission: RE | Admit: 2024-05-14 | Discharge: 2024-05-14 | Disposition: A | Discharge status: Home / Self Care | Attending: Internal Medicine | Admitting: Internal Medicine

## 2024-05-14 ENCOUNTER — Encounter (HOSPITAL_COMMUNITY)
Admission: RE | Disposition: A | Discharge status: Home / Self Care | Payer: Self-pay | Source: Home / Self Care | Attending: Internal Medicine

## 2024-05-14 DIAGNOSIS — Z539 Procedure and treatment not carried out, unspecified reason: Secondary | ICD-10-CM | POA: Diagnosis not present

## 2024-05-14 DIAGNOSIS — J449 Chronic obstructive pulmonary disease, unspecified: Secondary | ICD-10-CM | POA: Diagnosis not present

## 2024-05-14 DIAGNOSIS — Z992 Dependence on renal dialysis: Secondary | ICD-10-CM | POA: Diagnosis not present

## 2024-05-14 DIAGNOSIS — R079 Chest pain, unspecified: Secondary | ICD-10-CM | POA: Insufficient documentation

## 2024-05-14 DIAGNOSIS — N186 End stage renal disease: Secondary | ICD-10-CM | POA: Diagnosis not present

## 2024-05-14 DIAGNOSIS — N2581 Secondary hyperparathyroidism of renal origin: Secondary | ICD-10-CM | POA: Diagnosis not present

## 2024-05-14 DIAGNOSIS — D631 Anemia in chronic kidney disease: Secondary | ICD-10-CM | POA: Diagnosis not present

## 2024-05-14 DIAGNOSIS — E875 Hyperkalemia: Secondary | ICD-10-CM | POA: Diagnosis not present

## 2024-05-14 DIAGNOSIS — D689 Coagulation defect, unspecified: Secondary | ICD-10-CM | POA: Diagnosis not present

## 2024-05-14 DIAGNOSIS — D509 Iron deficiency anemia, unspecified: Secondary | ICD-10-CM | POA: Diagnosis not present

## 2024-05-14 SURGERY — TUNNELLED CATHETER EXCHANGE

## 2024-05-14 MED ORDER — PREDNISONE 50 MG PO TABS
50.0000 mg | ORAL_TABLET | Freq: Once | ORAL | Status: AC
  Administered 2024-05-14: 50 mg via ORAL
  Filled 2024-05-14: qty 1

## 2024-05-14 MED ORDER — IBUPROFEN 600 MG PO TABS
600.0000 mg | ORAL_TABLET | Freq: Once | ORAL | Status: AC
  Administered 2024-05-14: 600 mg via ORAL
  Filled 2024-05-14: qty 1

## 2024-05-14 MED ORDER — HEPARIN SODIUM (PORCINE) 1000 UNIT/ML IJ SOLN
4200.0000 [IU] | Freq: Once | INTRAMUSCULAR | Status: AC
  Administered 2024-05-14: 4200 [IU]

## 2024-05-14 NOTE — Progress Notes (Signed)
 Patient presents for possible [facility] TDC exchange.   She's had severe L sided chest pain noted for the past week which has resulted in 2 ED visits 5/11 and 5/14.   Evaluations including EKG, CXR, CT chest, CTA chest, troponins have been unrevealing.  The pain is stabbing under her left breast and occurs with deep breaths.    Dialysis requested evaluation today to see if a TDC exchange would be beneficial.    Physical exam shows a normal appearing exit site in the [facility].  There is no pain on palpation along the tunnel tract.   Review of imaging shows no central venous obstruction related to the catheter.  RN flushed catheter today and it worked well. There was no increase in pain.   Assessment:  CP unrelated to HD catheter and no exchange is indicated.  By description, her pain sounds pleuritic in nature and is quite severe.  She reports a dx of COPD.  Will rx a 3 day trial of ibuprofen  and prednisone  50mg  daily for pleurisy - 1st dose here.  She has taken steroids without issue in the past and reports no h/o psychosis or psychiatric issues.   My hope is this will improve her pain and prevent her from needing another ED visit.    Adrian Alba MD [facility] Pager [phone]

## 2024-05-14 NOTE — Progress Notes (Addendum)
 Patient sleeping for 60 minutes, regular respirations, easily awoken. Medications given.

## 2024-05-17 ENCOUNTER — Ambulatory Visit: Payer: Self-pay | Admitting: Pulmonary Disease

## 2024-05-17 ENCOUNTER — Other Ambulatory Visit: Payer: Self-pay

## 2024-05-17 DIAGNOSIS — N2581 Secondary hyperparathyroidism of renal origin: Secondary | ICD-10-CM | POA: Diagnosis not present

## 2024-05-17 DIAGNOSIS — Z992 Dependence on renal dialysis: Secondary | ICD-10-CM | POA: Diagnosis not present

## 2024-05-17 DIAGNOSIS — D689 Coagulation defect, unspecified: Secondary | ICD-10-CM | POA: Diagnosis not present

## 2024-05-17 DIAGNOSIS — D631 Anemia in chronic kidney disease: Secondary | ICD-10-CM | POA: Diagnosis not present

## 2024-05-17 DIAGNOSIS — E875 Hyperkalemia: Secondary | ICD-10-CM | POA: Diagnosis not present

## 2024-05-17 DIAGNOSIS — D509 Iron deficiency anemia, unspecified: Secondary | ICD-10-CM | POA: Diagnosis not present

## 2024-05-17 DIAGNOSIS — N186 End stage renal disease: Secondary | ICD-10-CM | POA: Diagnosis not present

## 2024-05-17 NOTE — Telephone Encounter (Signed)
 Please advise Dr. Judeth Horn

## 2024-05-17 NOTE — Patient Instructions (Signed)
 Visit Information  Thank you for taking time to visit with me today. Please don't hesitate to contact me if I can be of assistance to you before our next scheduled appointment.  Your next care management appointment is by telephone on Tuesday, June 10 at 12:30 PM  Please call the care guide team at [phone] if you need to cancel, schedule, or reschedule an appointment.   Please call 1-800-273-TALK (toll free, 24 hour hotline) if you are experiencing a Mental Health or Behavioral Health Crisis or need someone to talk to.  Louanne Roussel RN BSN CCM [place]  [facility], [facility] Nurse Care Coordinator  Direct Dial : [phone] Website: [email or web address]

## 2024-05-17 NOTE — Telephone Encounter (Signed)
 This needs to be seen by PCP or go to ED. Suspect she will need ED for a chest pain evaluation - this would be my recommendation.

## 2024-05-17 NOTE — Telephone Encounter (Signed)
 ATC x1. Ldvm with Dr. Ames Justin recommendation.

## 2024-05-17 NOTE — Telephone Encounter (Signed)
 Chief Complaint: CP Symptoms: SOB, wheezing, CP under L breast Frequency: x 3 events since Mother's day Pertinent Negatives: Patient denies fever, URI sx, severe SOB Disposition: [x] ED /[x] Urgent Care (no appt availability in office) / [] Appointment(In office/virtual)/ []  [place] [facility]/ [] Home Care/ [] Refused Recommended Disposition /[] [facility]/ []  Follow-up with PCP Additional Notes: Pt c/o CP, particularly under L breast since Mother's Day. Pt reports frequent ED visits and most recent visit was dx with pericarditis and given 2 days of steroids (finished over weekend). Of note, pt is currently receiving dialysis so triage was limited d/t background noise. Additionally, pt concerned with steroid efficacy if currently receiving dialysis. Pt reports attempting to call PCP without success, so attempting [facility] for guidance. Pt endorses also SOB above baseline and is taking respiratory INH as prescribed, as well as albuterol  2-3 x a day - pt is unsure if provides relief. Triager attempted to schedule HFU appt with PCP, but access was limited and pt already has upcoming F/U appt next month. Triager then attempted to schedule acute visit with [facility] but no access as well. Triager advised ED/UC for further evaluation/tx. Triager will forward encounter for Dr. Marygrace Snellen and [place] Hurter (PCP) 's office to review and advise. Patient verbalized understanding and is expecting call back from office for next steps if any. Triager also advised that if pt does not hear back from office, to follow disposition for further evaluation/treatment. Patient verbalized understanding and to call back/911 with worsening symptoms.      Copied from CRM [id number]. Topic: Clinical - Red Word Triage >> May 17, 2024  8:52 AM Isabell A wrote: Red Word that prompted transfer to Nurse Triage: Experiencing pain in between her chest  - unable to reach PCP, went to hospital already (three times since mothers day). Reason  for Disposition  Condition / symptoms SAME (not improving)  [1] Longstanding difficulty breathing (e.g., CHF, COPD, emphysema) AND [2] WORSE than normal  [1] Chest pain lasts > 5 minutes AND [2] occurred in past 3 days (72 hours) (Exception: Feels exactly the same as previously diagnosed heartburn and has accompanying sour taste in mouth.)  Answer Assessment - Initial Assessment Questions E2C2 Pulmonary Triage - Initial Assessment Questions "Chief Complaint (e.g., cough, sob, wheezing, fever, chills, sweat or additional symptoms) *Go to specific symptom protocol after initial questions. Recently went to ED Monday - was at dialysis and was sent via EMS for severe CP and dx for pericarditis - was given steroids, ibuprofen   Pt currently receiving dialysis.  "How long have symptoms been present?" Since Mother's day  Have you tested for COVID or Flu? Note: If not, ask patient if a home test can be taken. If so, instruct patient to call back for positive results. No  MEDICINES:   "Have you used any OTC meds to help with symptoms?" No If yes, ask "What medications?" N/a  "Have you used your inhalers/maintenance medication?" Yes If yes, "What medications?" Albuterol  - 2 puffs, has been using 2-3x a day - unsure if giving relief STIOLTO RESPIMAT  - 2 puffs daily  If inhaler, ask "How many puffs and how often?" Note: Review instructions on medication in the chart. See above  OXYGEN : "Do you wear supplemental oxygen ?" No If yes, "How many liters are you supposed to use?" N/a  "Do you monitor your oxygen  levels?" No If yes, "What is your reading (oxygen  level) today?" N/a, does not have      1. MAIN CONCERN OR SYMPTOM:  "What  is your main concern right now?" "What question do you have?" "What's the main symptom you're worried about?" (e.g., breathing difficulty, ankle swelling, weight gain.)     Needs HFU - unable to reach PCP 2. ONSET: "When did the  CP  start?"     Mother's Day 3.  BETTER-SAME-WORSE: "Are you getting better, staying the same, or getting worse compared to the day you were discharged?"     Same - pt reports able to tolerate 4. HOSPITALIZATION: "How long were you hospitalized?" (e.g., days)     2 5. DISCHARGE DIAGNOSIS:  "What problem or disease were you hospitalized for?"     Pericarditis 6. DISCHARGE DATE: "What date were you discharged from the hospital?"     5/16 7. DISCHARGE DOCTOR: "Who is the main doctor taking care of you now?"     See chart 8. DISCHARGE APPOINTMENT: "Have you scheduled a follow-up discharge appointment with your doctor?"     no 9. DISCHARGE MEDICINES: "Did the doctor (or NP/PA) who discharged you order any new medicines for you to use? If yes, have you filled the prescription and started taking the medicine?"      steroids 10. PAIN: "Is there any pain?" If Yes, ask: "How bad is it?"  (Scale 0-10; or mild, moderate, severe)   - NONE (0): no pain   - MILD (1-3): doesn't interfere with normal activities   - MODERATE (4-7): interferes with normal activities (e.g., work or school) or awakens from sleep   - SEVERE (8-10): excruciating pain, unable to do any normal activities       7/10 - "it never went away, still there" 11. FEVER: "Do you have a fever?" If Yes, ask: "What is it, how was it measured  and when did it start?"       denies 12. OTHER SYMPTOMS: "Do you have any other symptoms?"       SOB,  Answer Assessment - Initial Assessment Questions 1. RESPIRATORY STATUS: "Describe your breathing?" (e.g., wheezing, shortness of breath, unable to speak, severe coughing)      SOB, wheezing  3. PATTERN "Does the difficult breathing come and go, or has it been constant since it started?"      constant 4. SEVERITY: "How bad is your breathing?" (e.g., mild, moderate, severe)    - MILD: No SOB at rest, mild SOB with walking, speaks normally in sentences, can lie down, no retractions, pulse < 100.    - MODERATE: SOB at rest, SOB with  minimal exertion and prefers to sit, cannot lie down flat, speaks in phrases, mild retractions, audible wheezing, pulse 100-120.    - SEVERE: Very SOB at rest, speaks in single words, struggling to breathe, sitting hunched forward, retractions, pulse > 120      Mild-moderate 5. RECURRENT SYMPTOM: "Have you had difficulty breathing before?" If Yes, ask: "When was the last time?" and "What happened that time?"      no 6. CARDIAC HISTORY: "Do you have any history of heart disease?" (e.g., heart attack, angina, bypass surgery, angioplasty)      CHF per chart 7. LUNG HISTORY: "Do you have any history of lung disease?"  (e.g., pulmonary embolus, asthma, emphysema)     COPD 8. CAUSE: "What do you think is causing the breathing problem?"      unknown 9. OTHER SYMPTOMS: "Do you have any other symptoms? (e.g., dizziness, runny nose, cough, chest pain, fever)     Wheezing, CP  Protocols used: Chest Pain-A-AH,  Post-Hospitalization Follow-up Call-A-AH, Breathing Difficulty-A-AH

## 2024-05-17 NOTE — Patient Outreach (Signed)
 Complex Care Management   Visit Note  05/17/2024  Name:  Whitney Walker MRN: [id number] DOB: [birth year]  Situation: Referral received for Complex Care Management related to Heart Failure, COPD, ESRD, and Malignant Hypertension with heart failure and End Stage Renal disease. I obtained verbal consent from Patient.  Visit completed with patient on the phone.  Background:   Past Medical History:  Diagnosis Date   Anemia    Anxiety    CHF (congestive heart failure) (HCC)    Colon cancer (HCC)    treatment surgery   Complication of anesthesia    after first C- [place] "couldnt walk after", patient denies having a spinal   COPD (chronic obstructive pulmonary disease) (HCC)    Coronary artery disease    Depression    DVT (deep venous thrombosis) (HCC)    Encounter for annual health examination 08/05/2023   ESRD on hemodialysis (HCC)    Hemo: MWF   History of blood transfusion 04/2018   Hypertension    07/07/18- no longer takes BP medications   Leucocytosis 04/28/2012   Meningitis    Pain in limb 07/30/2013   PE (pulmonary embolism)    Peripheral vascular disease (HCC)    Primary hypertension 11/15/2013   Restless legs    Shortness of breath    with exertion   Sleep apnea    SOB (shortness of breath) 03/03/2019   Vertigo     Assessment: Patient Reported Symptoms:  Cognitive Cognitive Status: Alert and oriented to person, place, and time Cognitive/Intellectual Conditions Management [RPT]: None reported or documented in medical history or problem list      Neurological Neurological Review of Symptoms: Not assessed    HEENT HEENT Symptoms Reported: Not assessed      Cardiovascular Cardiovascular Symptoms Reported: No symptoms reported Does patient have uncontrolled Hypertension?: No Cardiovascular Conditions: Coronary artery disease, Hypertension, Heart failure Cardiovascular Management Strategies: Medication therapy, Routine screening Cardiovascular Self-Management  Outcome: 4 (good)  Respiratory Respiratory Symptoms Reported: Productive cough, Other: Other Respiratory Symptoms: new onset of sharp, stabbing pain to left upper chest below breast Respiratory Conditions: COPD, Sleep disordered breathing Respiratory Self-Management Outcome: 3 (uncertain) Respiratory Comment: new dx: of acute onset of Pleurisy  Endocrine Patient reports the following symptoms related to hypoglycemia or hyperglycemia : No symptoms reported Is patient diabetic?: Yes (prediabetes) Is patient checking blood sugars at home?: No Endocrine Conditions: Diabetes (Prediabetes) Endocrine Management Strategies: Diet modification, Routine screening Endocrine Self-Management Outcome: 4 (good)  Gastrointestinal Gastrointestinal Symptoms Reported: Not assessed      Genitourinary Genitourinary Symptoms Reported: No symptoms reported Genitourinary Conditions: End-stage renal disease Genitourinary Management Strategies: Hemodialysis Hemodialysis Schedule: M,W, F Hemodialysis Last Treatment: 05/17/24 Genitourinary Self-Management Outcome: 4 (good)  Integumentary Integumentary Symptoms Reported: Not assessed    Musculoskeletal Musculoskelatal Symptoms Reviewed: Difficulty walking Additional Musculoskeletal Details: s/p right ankle/foot fx Musculoskeletal Conditions: Mobility limited, Unsteady gait, Osteoarthritis Musculoskeletal Management Strategies: Exercise, Routine screening, Medical device Musculoskeletal Self-Management Outcome: 4 (good)      Psychosocial Psychosocial Symptoms Reported: Not assessed            05/06/2024    2:44 PM  Depression screen PHQ 2/9  Decreased Interest 1  Down, Depressed, Hopeless 1  PHQ - 2 Score 2    There were no vitals filed for this visit.  Medications Reviewed Today     Reviewed by Kaylene Pascal, RN (Registered Nurse) on 05/17/24 at 1301  Med List Status: <None>   Medication Order Taking? Sig Documenting Provider Last  Dose Status  Informant  acetaminophen  (TYLENOL ) 650 MG CR tablet [id number] No Take 1,950 mg by mouth every 8 (eight) hours as needed for pain. [provider] 05/11/2024 Active Self, Pharmacy Records  albuterol  (VENTOLIN  HFA) 108 (90 Base) MCG/ACT inhaler [id number] No Inhale 2 puffs into the lungs every 6 (six) hours as needed for wheezing or shortness of breath. Susanna Epley, FNP 05/11/2024 Active Self, Pharmacy Records  atorvastatin  (LIPITOR) 20 MG tablet [id number] No Take 1 tablet (20 mg total) by mouth daily.  Patient taking differently: Take 20 mg by mouth at bedtime.   Knox Perl, MD 05/11/2024 Expired 05/12/24 2359 Self, Pharmacy Records  Biotin  5 MG CAPS [id number] No Take 5 mg by mouth daily at 6 (six) AM. [provider] 05/11/2024 Active Self, Pharmacy Records  ELIQUIS  5 MG TABS tablet [id number] No TAKE 1 TABLET (5 MG TOTAL) BY MOUTH 2 (TWO) TIMES DAILY. Susanna Epley, FNP 05/11/2024  8:30 PM Active Self, Pharmacy Records  meclizine  (ANTIVERT ) 12.5 MG tablet [id number] No Take 1 tablet (12.5 mg total) by mouth 3 (three) times daily as needed for dizziness. Susanna Epley, FNP Unknown Active Self, Pharmacy Records           Med Note Nanine Babcock, [place] D   Wed May 12, 2024  8:38 AM) Has at home - hasn't used in a while.  midodrine  (PROAMATINE ) 5 MG tablet [id number] No Take 5 mg by mouth 2 (two) times daily as needed (hypotension). [provider] Past Month Active Self, Pharmacy Records  multivitamin (RENA-VIT) TABS tablet [id number] No Take 1 tablet by mouth at bedtime. [provider] 05/11/2024 Active Self, Pharmacy Records  omeprazole  (PRILOSEC) 20 MG capsule [id number] No TAKE 1 CAPSULE (20 MG TOTAL) BY MOUTH DAILY. Susanna Epley, FNP 05/11/2024 Active Self, Pharmacy Records  sevelamer  carbonate (RENVELA ) 800 MG tablet [id number] No Take 800 mg by mouth at bedtime. [provider] 05/11/2024 Active Self, Pharmacy Records           Med Note Nanine Babcock, [place] D    Wed May 12, 2024  8:35 AM)    sodium zirconium cyclosilicate  (LOKELMA ) 10 g PACK packet [id number] No Take 10 g by mouth daily. [provider] 05/11/2024 Active Self, Pharmacy Records  Tiotropium Bromide-Olodaterol (STIOLTO RESPIMAT ) 2.5-2.5 MCG/ACT AERS [id number] No Inhale 2 puffs into the lungs daily. Hunsucker, Archer Kobs, MD 05/10/2024 Active Self, Pharmacy Records  vitamin C  (ASCORBIC ACID ) 250 MG tablet [id number] No Take 250 mg by mouth daily. [provider] 05/11/2024 Active Self, Pharmacy Records  vitamin E 200 UNIT capsule [id number] No Take 200 Units by mouth daily. [provider] 05/11/2024 Active Self, Pharmacy Records  Med List Note Alline Ivans, CPhT 05/09/24 1149): Dialysis MWF            Recommendation:   PCP Follow-up with Melodie Spry FNP on Thursday, May 22 at 11:40 AM  Follow Up Plan:   Telephone follow up appointment date/time:  Tuesday, June 10 at 12:30 PM  Louanne Roussel RN BSN CCM [place]  [facility], [facility] Nurse Care Coordinator  Direct Dial : [phone] Website: [email or web address]

## 2024-05-19 ENCOUNTER — Observation Stay (HOSPITAL_COMMUNITY)
Admission: EM | Admit: 2024-05-19 | Discharge: 2024-05-20 | Disposition: A | Discharge status: Home / Self Care | Attending: Internal Medicine | Admitting: Internal Medicine

## 2024-05-19 ENCOUNTER — Emergency Department (HOSPITAL_COMMUNITY)

## 2024-05-19 ENCOUNTER — Ambulatory Visit: Payer: Self-pay

## 2024-05-19 ENCOUNTER — Other Ambulatory Visit: Payer: Self-pay

## 2024-05-19 DIAGNOSIS — K219 Gastro-esophageal reflux disease without esophagitis: Secondary | ICD-10-CM | POA: Diagnosis not present

## 2024-05-19 DIAGNOSIS — E785 Hyperlipidemia, unspecified: Secondary | ICD-10-CM | POA: Diagnosis not present

## 2024-05-19 DIAGNOSIS — R079 Chest pain, unspecified: Secondary | ICD-10-CM | POA: Diagnosis not present

## 2024-05-19 DIAGNOSIS — I959 Hypotension, unspecified: Secondary | ICD-10-CM

## 2024-05-19 DIAGNOSIS — I2510 Atherosclerotic heart disease of native coronary artery without angina pectoris: Secondary | ICD-10-CM | POA: Diagnosis not present

## 2024-05-19 DIAGNOSIS — I132 Hypertensive heart and chronic kidney disease with heart failure and with stage 5 chronic kidney disease, or end stage renal disease: Secondary | ICD-10-CM | POA: Diagnosis not present

## 2024-05-19 DIAGNOSIS — I82409 Acute embolism and thrombosis of unspecified deep veins of unspecified lower extremity: Secondary | ICD-10-CM | POA: Insufficient documentation

## 2024-05-19 DIAGNOSIS — Z85038 Personal history of other malignant neoplasm of large intestine: Secondary | ICD-10-CM | POA: Insufficient documentation

## 2024-05-19 DIAGNOSIS — Z992 Dependence on renal dialysis: Secondary | ICD-10-CM | POA: Diagnosis not present

## 2024-05-19 DIAGNOSIS — R6889 Other general symptoms and signs: Secondary | ICD-10-CM | POA: Diagnosis not present

## 2024-05-19 DIAGNOSIS — I739 Peripheral vascular disease, unspecified: Secondary | ICD-10-CM | POA: Diagnosis not present

## 2024-05-19 DIAGNOSIS — D689 Coagulation defect, unspecified: Secondary | ICD-10-CM | POA: Diagnosis not present

## 2024-05-19 DIAGNOSIS — N186 End stage renal disease: Secondary | ICD-10-CM | POA: Insufficient documentation

## 2024-05-19 DIAGNOSIS — R0902 Hypoxemia: Secondary | ICD-10-CM | POA: Diagnosis not present

## 2024-05-19 DIAGNOSIS — R918 Other nonspecific abnormal finding of lung field: Secondary | ICD-10-CM | POA: Diagnosis not present

## 2024-05-19 DIAGNOSIS — R0989 Other specified symptoms and signs involving the circulatory and respiratory systems: Secondary | ICD-10-CM | POA: Diagnosis not present

## 2024-05-19 DIAGNOSIS — Z79899 Other long term (current) drug therapy: Secondary | ICD-10-CM | POA: Insufficient documentation

## 2024-05-19 DIAGNOSIS — D72829 Elevated white blood cell count, unspecified: Secondary | ICD-10-CM | POA: Insufficient documentation

## 2024-05-19 DIAGNOSIS — R0789 Other chest pain: Principal | ICD-10-CM | POA: Insufficient documentation

## 2024-05-19 DIAGNOSIS — D509 Iron deficiency anemia, unspecified: Secondary | ICD-10-CM | POA: Diagnosis not present

## 2024-05-19 DIAGNOSIS — D631 Anemia in chronic kidney disease: Secondary | ICD-10-CM | POA: Diagnosis not present

## 2024-05-19 DIAGNOSIS — N183 Chronic kidney disease, stage 3 unspecified: Secondary | ICD-10-CM | POA: Diagnosis present

## 2024-05-19 DIAGNOSIS — J449 Chronic obstructive pulmonary disease, unspecified: Secondary | ICD-10-CM | POA: Insufficient documentation

## 2024-05-19 DIAGNOSIS — I700 Atherosclerosis of aorta: Secondary | ICD-10-CM | POA: Diagnosis not present

## 2024-05-19 DIAGNOSIS — E875 Hyperkalemia: Secondary | ICD-10-CM | POA: Diagnosis not present

## 2024-05-19 DIAGNOSIS — I5032 Chronic diastolic (congestive) heart failure: Secondary | ICD-10-CM | POA: Diagnosis present

## 2024-05-19 DIAGNOSIS — I499 Cardiac arrhythmia, unspecified: Secondary | ICD-10-CM | POA: Diagnosis not present

## 2024-05-19 DIAGNOSIS — N2581 Secondary hyperparathyroidism of renal origin: Secondary | ICD-10-CM | POA: Diagnosis not present

## 2024-05-19 DIAGNOSIS — I517 Cardiomegaly: Secondary | ICD-10-CM | POA: Diagnosis not present

## 2024-05-19 LAB — COMPREHENSIVE METABOLIC PANEL WITH GFR
ALT: 16 U/L (ref 0–44)
AST: 17 U/L (ref 15–41)
Albumin: 2.9 g/dL — ABNORMAL LOW (ref 3.5–5.0)
Alkaline Phosphatase: 89 U/L (ref 38–126)
Anion gap: 10 (ref 5–15)
BUN: 15 mg/dL (ref 8–23)
CO2: 27 mmol/L (ref 22–32)
Calcium: 7.5 mg/dL — ABNORMAL LOW (ref 8.9–10.3)
Chloride: 96 mmol/L — ABNORMAL LOW (ref 98–111)
Creatinine, Ser: 4.59 mg/dL — ABNORMAL HIGH (ref 0.44–1.00)
GFR, Estimated: 10 mL/min — ABNORMAL LOW (ref >60)
Glucose, Bld: 140 mg/dL — ABNORMAL HIGH (ref 70–99)
Potassium: 4 mmol/L (ref 3.5–5.1)
Sodium: 133 mmol/L — ABNORMAL LOW (ref 135–145)
Total Bilirubin: 0.6 mg/dL (ref 0.0–1.2)
Total Protein: 6.5 g/dL (ref 6.5–8.1)

## 2024-05-19 LAB — CBC WITH DIFFERENTIAL/PLATELET
Abs Immature Granulocytes: 0.12 10*3/uL — ABNORMAL HIGH (ref 0.00–0.07)
Basophils Absolute: 0 10*3/uL (ref 0.0–0.1)
Basophils Relative: 0 %
Eosinophils Absolute: 0.2 10*3/uL (ref 0.0–0.5)
Eosinophils Relative: 1 %
HCT: 39.9 % (ref 36.0–46.0)
Hemoglobin: 12.5 g/dL (ref 12.0–15.0)
Immature Granulocytes: 1 %
Lymphocytes Relative: 15 %
Lymphs Abs: 2.1 10*3/uL (ref 0.7–4.0)
MCH: 29.6 pg (ref 26.0–34.0)
MCHC: 31.3 g/dL (ref 30.0–36.0)
MCV: 94.3 fL (ref 80.0–100.0)
Monocytes Absolute: 1.2 10*3/uL — ABNORMAL HIGH (ref 0.1–1.0)
Monocytes Relative: 8 %
Neutro Abs: 10.6 10*3/uL — ABNORMAL HIGH (ref 1.7–7.7)
Neutrophils Relative %: 75 %
Platelets: 205 10*3/uL (ref 150–400)
RBC: 4.23 MIL/uL (ref 3.87–5.11)
RDW: 16.5 % — ABNORMAL HIGH (ref 11.5–15.5)
WBC: 14.3 10*3/uL — ABNORMAL HIGH (ref 4.0–10.5)
nRBC: 0 % (ref 0.0–0.2)

## 2024-05-19 LAB — MAGNESIUM: Magnesium: 1.7 mg/dL (ref 1.7–2.4)

## 2024-05-19 LAB — TROPONIN I (HIGH SENSITIVITY): Troponin I (High Sensitivity): 28 ng/L — ABNORMAL HIGH (ref <18)

## 2024-05-19 LAB — C-REACTIVE PROTEIN: CRP: 2.5 mg/dL — ABNORMAL HIGH (ref <1.0)

## 2024-05-19 LAB — SEDIMENTATION RATE: Sed Rate: 22 mm/h (ref 0–22)

## 2024-05-19 LAB — PHOSPHORUS: Phosphorus: 2.5 mg/dL (ref 2.5–4.6)

## 2024-05-19 LAB — CK: Total CK: 32 U/L — ABNORMAL LOW (ref 38–234)

## 2024-05-19 MED ORDER — METOCLOPRAMIDE HCL 5 MG/ML IJ SOLN
10.0000 mg | Freq: Once | INTRAMUSCULAR | Status: AC
  Administered 2024-05-19: 10 mg via INTRAVENOUS
  Filled 2024-05-19: qty 2

## 2024-05-19 MED ORDER — HYDROMORPHONE HCL 1 MG/ML IJ SOLN
2.0000 mg | Freq: Once | INTRAMUSCULAR | Status: AC
  Administered 2024-05-19: 2 mg via INTRAMUSCULAR
  Filled 2024-05-19: qty 2

## 2024-05-19 MED ORDER — ACETAMINOPHEN 325 MG PO TABS: 650.0000 mg | ORAL_TABLET | Freq: Three times a day (TID) | ORAL | Status: DC | PRN

## 2024-05-19 MED ORDER — ALUM & MAG HYDROXIDE-SIMETH 200-200-20 MG/5ML PO SUSP
30.0000 mL | Freq: Once | ORAL | Status: DC
  Filled 2024-05-19: qty 30

## 2024-05-19 MED ORDER — HYDROMORPHONE HCL 1 MG/ML IJ SOLN
1.0000 mg | Freq: Once | INTRAMUSCULAR | Status: AC
  Administered 2024-05-19: 1 mg via INTRAVENOUS
  Filled 2024-05-19: qty 1

## 2024-05-19 MED ORDER — ONDANSETRON HCL 4 MG/2ML IJ SOLN: 4.0000 mg | Freq: Four times a day (QID) | INTRAMUSCULAR | Status: DC | PRN

## 2024-05-19 MED ORDER — ALBUTEROL SULFATE (2.5 MG/3ML) 0.083% IN NEBU: 2.5000 mg | INHALATION_SOLUTION | Freq: Four times a day (QID) | RESPIRATORY_TRACT | Status: DC | PRN

## 2024-05-19 MED ORDER — ATORVASTATIN CALCIUM 10 MG PO TABS
20.0000 mg | ORAL_TABLET | Freq: Every day | ORAL | Status: DC
  Administered 2024-05-19: 20 mg via ORAL
  Filled 2024-05-19: qty 2

## 2024-05-19 MED ORDER — UMECLIDINIUM BROMIDE 62.5 MCG/ACT IN AEPB
1.0000 | INHALATION_SPRAY | Freq: Every day | RESPIRATORY_TRACT | Status: DC
  Filled 2024-05-19: qty 7

## 2024-05-19 MED ORDER — ONDANSETRON 4 MG PO TBDP
4.0000 mg | ORAL_TABLET | Freq: Once | ORAL | Status: AC
  Administered 2024-05-19: 4 mg via ORAL
  Filled 2024-05-19: qty 1

## 2024-05-19 MED ORDER — SODIUM ZIRCONIUM CYCLOSILICATE 10 G PO PACK
10.0000 g | PACK | Freq: Every day | ORAL | Status: DC
  Administered 2024-05-20: 10 g via ORAL
  Filled 2024-05-19: qty 1

## 2024-05-19 MED ORDER — PANTOPRAZOLE SODIUM 40 MG PO TBEC
40.0000 mg | DELAYED_RELEASE_TABLET | Freq: Every day | ORAL | Status: DC
  Administered 2024-05-20: 40 mg via ORAL
  Filled 2024-05-19 (×2): qty 1

## 2024-05-19 MED ORDER — LACTATED RINGERS IV BOLUS
1000.0000 mL | Freq: Once | INTRAVENOUS | Status: AC
  Administered 2024-05-19: 1000 mL via INTRAVENOUS

## 2024-05-19 MED ORDER — PREDNISONE 20 MG PO TABS: 40.0000 mg | ORAL_TABLET | Freq: Every day | ORAL | Status: DC

## 2024-05-19 MED ORDER — LIDOCAINE VISCOUS HCL 2 % MT SOLN
15.0000 mL | Freq: Once | OROMUCOSAL | Status: DC
  Filled 2024-05-19: qty 15

## 2024-05-19 MED ORDER — ALBUTEROL SULFATE HFA 108 (90 BASE) MCG/ACT IN AERS
2.0000 | INHALATION_SPRAY | Freq: Four times a day (QID) | RESPIRATORY_TRACT | Status: DC | PRN
Start: 2024-05-19 — End: 2024-05-19

## 2024-05-19 MED ORDER — SODIUM ZIRCONIUM CYCLOSILICATE 10 G PO PACK: 10.0000 g | PACK | Freq: Every day | ORAL | Status: DC

## 2024-05-19 MED ORDER — APIXABAN 5 MG PO TABS
5.0000 mg | ORAL_TABLET | Freq: Two times a day (BID) | ORAL | Status: DC
  Administered 2024-05-19 – 2024-05-20 (×2): 5 mg via ORAL
  Filled 2024-05-19 (×2): qty 1

## 2024-05-19 MED ORDER — METHOCARBAMOL 500 MG PO TABS: 500.0000 mg | ORAL_TABLET | Freq: Four times a day (QID) | ORAL | Status: DC | PRN

## 2024-05-19 MED ORDER — MIDODRINE HCL 5 MG PO TABS: 5.0000 mg | ORAL_TABLET | Freq: Two times a day (BID) | ORAL | Status: DC | PRN

## 2024-05-19 MED ORDER — METHOCARBAMOL 500 MG PO TABS
500.0000 mg | ORAL_TABLET | Freq: Once | ORAL | Status: AC
  Administered 2024-05-19: 500 mg via ORAL
  Filled 2024-05-19: qty 1

## 2024-05-19 MED ORDER — HYDROMORPHONE HCL 1 MG/ML IJ SOLN
0.5000 mg | Freq: Three times a day (TID) | INTRAMUSCULAR | Status: DC | PRN
  Administered 2024-05-19 – 2024-05-20 (×2): 0.5 mg via INTRAVENOUS
  Filled 2024-05-19 (×2): qty 0.5

## 2024-05-19 MED ORDER — ARFORMOTEROL TARTRATE 15 MCG/2ML IN NEBU
15.0000 ug | INHALATION_SOLUTION | Freq: Two times a day (BID) | RESPIRATORY_TRACT | Status: DC
  Administered 2024-05-19 – 2024-05-20 (×2): 15 ug via RESPIRATORY_TRACT
  Filled 2024-05-19 (×2): qty 2

## 2024-05-19 MED ORDER — MECLIZINE HCL 25 MG PO TABS: 12.5000 mg | ORAL_TABLET | Freq: Three times a day (TID) | ORAL | Status: DC | PRN

## 2024-05-19 MED ORDER — SEVELAMER CARBONATE 800 MG PO TABS
800.0000 mg | ORAL_TABLET | Freq: Every day | ORAL | Status: DC
  Filled 2024-05-19: qty 1

## 2024-05-19 NOTE — ED Notes (Signed)
 Pt placed on 2L o2 for spo2 in the high 70's while sleeping

## 2024-05-19 NOTE — H&P (Signed)
 History and Physical    Patient: Whitney Walker WUJ:[id number] DOB: [birth year] DOA: 05/19/2024 DOS: the patient was seen and examined on 05/19/2024 PCP: Susanna Epley, FNP  Patient coming from: Home  Chief Complaint:  Chief Complaint  Patient presents with   Chest Pain   HPI: Whitney Walker is a 68 y.o. female with medical history significant of hypertension, hyperlipidemia, osa, PAD, s/p left fem pop [place] in 2014, DVT, PE, on eliquis , chronic diastolic CHF, ESRD on HD on MWF presents with chest pain going on for 2 to 3 weeks. She reports pain is intermittent, and worsens with deep breathing, not related to activity or food intake. She denies fever and chills, nausea, vomiting and abdominal pain,. No hematochezia. No headache, and dizziness, blurry vision.   She reports coming to ED at least 3 times in the last 2 weeks for similar chest pain without relief.   ED work up On arrival to ED, she was afebrile, tachycardic, hypotensive with BP OF 69/57 MMHG and hypoxic 79% on RA.  Labs reveal mild leukocytosis of 14,300, troponin elevated at 28. CT chest without contrast showed  Minimal bibasilar subsegmental atelectasis or scarring.   She underwent CT angiogram of the chest  on 5/11 and is negative fo PE.  She was given IV dilaudid  for chest pain and her oxygen  sats dropped to 79% and low 80% and was referred to admission for pain control and hypoxia.    Review of Systems: As mentioned in the history of present illness. All other systems reviewed and are negative. Past Medical History:  Diagnosis Date   Anemia    Anxiety    CHF (congestive heart failure) (HCC)    Colon cancer (HCC)    treatment surgery   Complication of anesthesia    after first [facility]- [place] "couldnt walk after", patient denies having a spinal   COPD (chronic obstructive pulmonary disease) (HCC)    Coronary artery disease    Depression    DVT (deep venous thrombosis) (HCC)    Encounter for annual health  examination 08/05/2023   ESRD on hemodialysis (HCC)    Hemo: MWF   History of blood transfusion 04/2018   Hypertension    07/07/18- no longer takes BP medications   Leucocytosis 04/28/2012   Meningitis    Pain in limb 07/30/2013   PE (pulmonary embolism)    Peripheral vascular disease (HCC)    Primary hypertension 11/15/2013   Restless legs    Shortness of breath    with exertion   Sleep apnea    SOB (shortness of breath) 03/03/2019   Vertigo    Past Surgical History:  Procedure Laterality Date   ABDOMINAL AORTAGRAM N/A 07/26/2013   Procedure: ABDOMINAL Tommi Fraise;  Surgeon: Arvil Lauber, MD;  Location: [facility] CATH LAB;  Service: Cardiovascular;  Laterality: N/A;   ABDOMINAL HYSTERECTOMY     AV FISTULA PLACEMENT Left 05/05/2018   Procedure: ARTERIOVENOUS (AV) FISTULA CREATION BRACHIOCEPHALIC;  Surgeon: Adine Hoof, MD;  Location: [facility] OR;  Service: Vascular;  Laterality: Left;   AV FISTULA PLACEMENT Left 07/16/2018   Procedure: ARTERIOVENOUS FISTULA CREATION;  Surgeon: Adine Hoof, MD;  Location: [facility] OR;  Service: Vascular;  Laterality: Left;   BASCILIC VEIN TRANSPOSITION Left 09/03/2018   Procedure: BASILIC VEIN TRANSPOSITION SECOND STAGE;  Surgeon: Adine Hoof, MD;  Location: [facility] OR;  Service: Vascular;  Laterality: Left;   BREAST BIOPSY Left    CESAREAN SECTION     X 3 [phone]  CHOLECYSTECTOMY     CHOLECYSTECTOMY  1980/s   COLECTOMY  2010   DIVERTICULOSIS SURGERY-2002  2012   FEMORAL-POPLITEAL BYPASS GRAFT Left 08/13/2013   Procedure: BYPASS GRAFT FEMORAL-POPLITEAL ARTERY WITH NON-REVERSED SAPHANEOUS VEIN; ULTRASOUND GUIDED;  Surgeon: Palma Bob, MD;  Location: [facility] OR;  Service: Vascular;  Laterality: Left;   FLEXIBLE SIGMOIDOSCOPY N/A 12/10/2022   Procedure: FLEXIBLE SIGMOIDOSCOPY;  Surgeon: Alvis Jourdain, MD;  Location: [facility];  Service: Gastroenterology;  Laterality: N/A;   INSERTION OF DIALYSIS CATHETER Right 04/30/2018   Procedure:  INSERTION OF Right Internal Jugular DIALYSIS CATHETER;  Surgeon: Margherita Shell, MD;  Location: [facility] OR;  Service: Vascular;  Laterality: Right;   IR FLUORO GUIDE CV LINE LEFT  05/01/2022   IR FLUORO GUIDE CV LINE LEFT  05/21/2022   IR FLUORO GUIDE CV LINE RIGHT  07/24/2020   IR FLUORO GUIDE CV LINE RIGHT  07/21/2021   IR FLUORO GUIDE CV LINE RIGHT  05/20/2022   IR PTA VENOUS EXCEPT DIALYSIS CIRCUIT  05/21/2022   IR REMOVAL TUN CV CATH W/O FL  07/21/2020   IR REMOVAL TUN CV CATH W/O FL  07/19/2021   IR REMOVAL TUN CV CATH W/O FL  05/17/2022   IR US  GUIDE VASC ACCESS LEFT  05/21/2022   IR US  GUIDE VASC ACCESS RIGHT  07/24/2020   IR US  GUIDE VASC ACCESS RIGHT  07/21/2021   IR US  GUIDE VASC ACCESS RIGHT  05/20/2022   IR VENOCAVAGRAM SVC  05/21/2022   LEFT HEART CATH AND CORONARY ANGIOGRAPHY N/A 05/01/2021   Procedure: LEFT HEART CATH AND CORONARY ANGIOGRAPHY;  Surgeon: Knox Perl, MD;  Location: [facility] INVASIVE CV LAB;  Service: Cardiovascular;  Laterality: N/A;   LEFT HEART CATH AND CORONARY ANGIOGRAPHY N/A 05/02/2022   Procedure: LEFT HEART CATH AND CORONARY ANGIOGRAPHY;  Surgeon: Knox Perl, MD;  Location: [facility] INVASIVE CV LAB;  Service: Cardiovascular;  Laterality: N/A;   LOWER EXTREMITY ANGIOGRAM Left 07/26/2013   Procedure: LOWER EXTREMITY ANGIOGRAM;  Surgeon: Arvil Lauber, MD;  Location[facility] CATH LAB;  Service: Cardiovascular;  Laterality: Left;   Social History:  reports that she has been smoking cigarettes. She started smoking about 51 years ago. She has a 25 pack-year smoking history. She has quit using smokeless tobacco. She reports that she does not currently use alcohol. She reports that she does not currently use drugs after having used the following drugs: Oxycodone .  Allergies  Allergen Reactions   Difelikefalin Itching   Gadolinium Derivatives Hives and Other (See Comments)    HIVES, Desc: HIVES W/ "DYE" USED FOR 1ST CT SCAN BUT NOT 2ND, NO PREMEDS USED, PT UNCERTAIN OF CIRCUMSTANCES,,?POSSIBLE MRI  CONTRAST ALLERGY, ALL STUDIES DONE "SOMEWHERE" IN [place] //A.C., Onset Date: [id number]   Naltrexone Other (See Comments)    Unknown 1974     Family History  Problem Relation Age of Onset   Cancer Mother 89       breast and bone   Cancer Father 45       prostate   Hypertension Sister    Bleeding Disorder Sister    Hypertension Daughter    Cancer Cousin 20       breast cancer    Congestive Heart Failure Son    Breast cancer Neg Hx     Prior to Admission medications   Medication Sig Start Date End Date Taking? Authorizing Provider  acetaminophen  (TYLENOL ) 650 MG CR tablet Take 1,950 mg by mouth every 8 (eight) hours as needed for pain.  Yes [provider]  albuterol  (VENTOLIN  HFA) 108 (90 Base) MCG/ACT inhaler Inhale 2 puffs into the lungs every 6 (six) hours as needed for wheezing or shortness of breath. 09/18/23  Yes Susanna Epley, FNP  atorvastatin  (LIPITOR) 20 MG tablet Take 1 tablet (20 mg total) by mouth daily. Patient taking differently: Take 20 mg by mouth at bedtime. 01/20/24 05/19/24 Yes Knox Perl, MD  Biotin  5 MG CAPS Take 5 mg by mouth daily at 6 (six) AM.   Yes [provider]  ELIQUIS  5 MG TABS tablet TAKE 1 TABLET (5 MG TOTAL) BY MOUTH 2 (TWO) TIMES DAILY. 04/20/24  Yes Susanna Epley, FNP  meclizine  (ANTIVERT ) 12.5 MG tablet Take 1 tablet (12.5 mg total) by mouth 3 (three) times daily as needed for dizziness. 09/18/23  Yes Susanna Epley, FNP  midodrine  (PROAMATINE ) 5 MG tablet Take 5 mg by mouth 2 (two) times daily as needed (hypotension). 12/03/23  Yes [provider]  multivitamin (RENA-VIT) TABS tablet Take 1 tablet by mouth at bedtime.   Yes [provider]  omeprazole  (PRILOSEC) 20 MG capsule TAKE 1 CAPSULE (20 MG TOTAL) BY MOUTH DAILY. 03/15/24  Yes Susanna Epley, FNP  sevelamer  carbonate (RENVELA ) 800 MG tablet Take 800 mg by mouth at bedtime. 10/27/23  Yes [provider]  sodium zirconium cyclosilicate  (LOKELMA ) 10 g  PACK packet Take 10 g by mouth daily.   Yes [provider]  Tiotropium Bromide-Olodaterol (STIOLTO RESPIMAT ) 2.5-2.5 MCG/ACT AERS Inhale 2 puffs into the lungs daily. 05/04/24  Yes Hunsucker, Archer Kobs, MD  vitamin C  (ASCORBIC ACID ) 250 MG tablet Take 250 mg by mouth daily.   Yes [provider]  vitamin E 200 UNIT capsule Take 200 Units by mouth daily.   Yes [provider]    Physical Exam: Vitals:   05/19/24 1645 05/19/24 1715 05/19/24 1730 05/19/24 1745  BP: (!) 93/52 (!) 109/57 (!) 100/53 (!) 112/49  Pulse:  (!) 103 (!) 102 97  Resp:      Temp:      TempSrc:      SpO2:  97% 98% 97%  Weight:      Height:       General exam: Appears calm and comfortable  Respiratory system: Clear to auscultation. Respiratory effort normal. Cardiovascular system: S1 & S2 heard, RRR. No JVD,  Gastrointestinal system: Abdomen is nondistended, soft and nontender.  Central nervous system: Alert and oriented. Extremities: no pedal edema.  Skin: No rashes,  Psychiatry: Mood & affect appropriate.   Data Reviewed: Results for orders placed or performed during the hospital encounter of 05/19/24 (from the past 24 hours)  Comprehensive metabolic panel with GFR     Status: Abnormal   Collection Time: 05/19/24 10:19 AM  Result Value Ref Range   Sodium 133 (L) 135 - 145 mmol/L   Potassium 4.0 3.5 - 5.1 mmol/L   Chloride 96 (L) 98 - 111 mmol/L   CO2 27 22 - 32 mmol/L   Glucose, Bld 140 (H) 70 - 99 mg/dL   BUN 15 8 - 23 mg/dL   Creatinine, Ser 1.61 (H) 0.44 - 1.00 mg/dL   Calcium  7.5 (L) 8.9 - 10.3 mg/dL   Total Protein 6.5 6.5 - 8.1 g/dL   Albumin  2.9 (L) 3.5 - 5.0 g/dL   AST 17 15 - 41 U/L   ALT 16 0 - 44 U/L   Alkaline Phosphatase 89 38 - 126 U/L   Total Bilirubin 0.6 0.0 - 1.2 mg/dL  GFR, Estimated 10 (L) >60 mL/min   Anion gap 10 5 - 15  Troponin I (High Sensitivity)     Status: Abnormal   Collection Time: 05/19/24 10:19 AM  Result Value Ref Range   Troponin I  (High Sensitivity) 28 (H) <18 ng/L  CBC with Differential/Platelet     Status: Abnormal   Collection Time: 05/19/24 10:19 AM  Result Value Ref Range   WBC 14.3 (H) 4.0 - 10.5 K/uL   RBC 4.23 3.87 - 5.11 MIL/uL   Hemoglobin 12.5 12.0 - 15.0 g/dL   HCT 19.1 47.8 - 29.5 %   MCV 94.3 80.0 - 100.0 fL   MCH 29.6 26.0 - 34.0 pg   MCHC 31.3 30.0 - 36.0 g/dL   RDW 62.1 (H) 30.8 - 65.7 %   Platelets 205 150 - 400 K/uL   nRBC 0.0 0.0 - 0.2 %   Neutrophils Relative % 75 %   Neutro Abs 10.6 (H) 1.7 - 7.7 K/uL   Lymphocytes Relative 15 %   Lymphs Abs 2.1 0.7 - 4.0 K/uL   Monocytes Relative 8 %   Monocytes Absolute 1.2 (H) 0.1 - 1.0 K/uL   Eosinophils Relative 1 %   Eosinophils Absolute 0.2 0.0 - 0.5 K/uL   Basophils Relative 0 %   Basophils Absolute 0.0 0.0 - 0.1 K/uL   Immature Granulocytes 1 %   Abs Immature Granulocytes 0.12 (H) 0.00 - 0.07 K/uL  Magnesium      Status: None   Collection Time: 05/19/24 10:19 AM  Result Value Ref Range   Magnesium  1.7 1.7 - 2.4 mg/dL  Phosphorus     Status: None   Collection Time: 05/19/24 10:19 AM  Result Value Ref Range   Phosphorus 2.5 2.5 - 4.6 mg/dL   *Note: Due to a large number of results and/or encounters for the requested time period, some results have not been displayed. A complete set of results can be found in Results Review.     Assessment and Plan:  Atypical chest pain Differential include costochondritis vs esophageal spasm . Rule out pericarditis/ pericardial effusion.  Minimally elevated troponins.  EKG shows sinus tachycardia.  Get echocardiogram. Check CK levels, ESR and CRP.  1 dose of gi cocktail with lidocaine  and continue with Protonix . Continue with robaxin.  Start the patient on prednisone  for  possible costochondritis.   ESRD ON HD  MWF.  Her last HD is today.  Will notify nephrology of the patient's admission.    Hypertension BP parameters are optimal.  Continue with midodrine .    H/o DVT/PE  On anti  coagulation.   Hyperlipidemia Resume statin.  Check CK levels.    PAD:  Resume eliquis  and statin.    Chronic diastolic CHF She appears euvolemic.      Advance Care Planning:   Code Status: Full Code   Consults: nephrology.   Family Communication: none at bedside.   Severity of Illness: The appropriate patient status for this patient is OBSERVATION. Observation status is judged to be reasonable and necessary in order to provide the required intensity of service to ensure the patient's safety. The patient's presenting symptoms, physical exam findings, and initial radiographic and laboratory data in the context of their medical condition is felt to place them at decreased risk for further clinical deterioration. Furthermore, it is anticipated that the patient will be medically stable for discharge from the hospital within 2 midnights of admission.   Author: Feliciana Horn, MD 05/19/2024 6:16 PM  For on  call review [email or web address].

## 2024-05-19 NOTE — ED Notes (Signed)
 Pt denies the effectiveness of the pain medication that she received. When this nurse went to assess the effectiveness of the medication, pt was resting with eyes closed with an even rise and fall of her chest. Pt name was called and pt had to be rubbed on the shoulder to wake to answer this nurses questions

## 2024-05-19 NOTE — ED Provider Notes (Signed)
  Physical Exam  BP 117/68   Pulse 97   Temp 97.8 F (36.6 C) (Oral)   Resp 18   Ht 5\' 1"  (1.549 m)   Wt 90.7 kg   SpO2 95%   BMI 37.79 kg/m   Physical Exam  Procedures  Procedures  ED Course / MDM    Medical Decision Making Amount and/or Complexity of Data Reviewed Labs: ordered. Radiology: ordered.  Risk Prescription drug management.   Received in signout.  Pain.  Likely chest wall pain.  Scan reassuring.  On anticoagulation.  Had CTA done due to indeterminant x-ray.  However has been having episodes of hypoxia here with the pain medicine.  Does have sleep.  However no relief with narcotics at home.  Did have initial hypotension likely due to her dialysis.  Doubt sepsis.  I think patient benefit from admission in the hospital for further adjustment of her pain medicine with hypoxia with treatment here and uncontrolled pain with her oral narcotics.      Mozell Arias, MD 05/19/24 [phone]

## 2024-05-19 NOTE — ED Notes (Signed)
 Pt ambulated to bathroom with a mostly steady gait

## 2024-05-19 NOTE — Plan of Care (Signed)
 Pt is a 68 y/o female with past medical history of HTN, HLD, COPD, tobacco use, PAD, OSA, and ESRD on HD MWF, admitted with complaint of 10/10 stabbing left chest pain radiating under her left breast. Pt also complained of ongoing right shoulder and hip pain. Pt had a fall on jan 30th , 2025 with ongoing right-sided discomfort. BP 130/60 map 82, HR 97, RR 20, T 98.2, O2 98% on 2L [place].   Problem: Education: Goal: Knowledge of General Education information will improve Description: Including pain rating scale, medication(s)/side effects and non-pharmacologic comfort measures Outcome: Progressing   Problem: Health Behavior/Discharge Planning: Goal: Ability to manage health-related needs will improve Outcome: Progressing   Problem: Clinical Measurements: Goal: Ability to maintain clinical measurements within normal limits will improve Outcome: Progressing Goal: Will remain free from infection Outcome: Progressing Goal: Diagnostic test results will improve Outcome: Progressing Goal: Respiratory complications will improve Outcome: Progressing Goal: Cardiovascular complication will be avoided Outcome: Progressing   Problem: Activity: Goal: Risk for activity intolerance will decrease Outcome: Progressing   Problem: Nutrition: Goal: Adequate nutrition will be maintained Outcome: Progressing   Problem: Coping: Goal: Level of anxiety will decrease Outcome: Progressing   Problem: Elimination: Goal: Will not experience complications related to bowel motility Outcome: Progressing Goal: Will not experience complications related to urinary retention Outcome: Progressing   Problem: Pain Managment: Goal: General experience of comfort will improve and/or be controlled Outcome: Progressing   Problem: Safety: Goal: Ability to remain free from injury will improve Outcome: Progressing   Problem: Skin Integrity: Goal: Risk for impaired skin integrity will decrease Outcome: Progressing

## 2024-05-19 NOTE — ED Notes (Signed)
 This nurse asked pt sleep apnea after SPO2 dropped while resting with her eyes closed. Pt confirmed that she does have sleep apnea. Oxygen  not placed at this time.

## 2024-05-19 NOTE — Telephone Encounter (Signed)
 Chief Complaint: chest pain Symptoms: chest pain, SOB at rest Frequency: "it never stopped" Pertinent Negatives: Patient denies fever, cough, dizziness, diaphoresis, nausea, vomiting  Disposition: [x] ED /[] Urgent Care (no appt availability in office) / [] Appointment(In office/virtual)/ []  [facility]/ [] Home Care/ [] Refused Recommended Disposition /[] [facility]/ []  Follow-up with PCP Additional Notes: Pt reports severe chest pain. Pt states "it's past a 10" when RN asked the pt to rate her pain. Pt states this pain is constant and is the same pain she has had for months. Pt states she was seen multiple times in the ED for pain. Pt states she is unable to sleep at night due to pain. Pt reports worsening SOB and is SOB at rest. Pt states she is unable to take a deep breath. Pt is currently on O2 but is unable to recall how many liters. RN spoke to the pt's dialysis nurse and called 911 for the pt. RN advised the pt that EMS is on the way.    Copied from CRM [id number]. Topic: Clinical - Red Word Triage >> May 19, 2024  8:42 AM Baldomero Bone wrote: Red Word that prompted transfer to Nurse Triage: Severe chest pains. Patient is at dialysis now. Callback number is [phone] Reason for Disposition  [1] Chest pain lasts > 5 minutes AND [2] age > 4  Answer Assessment - Initial Assessment Questions 1. LOCATION: "Where does it hurt?"       "Under my L breast" 2. RADIATION: "Does the pain go anywhere else?" (e.g., into neck, jaw, arms, back)     No  3. ONSET: "When did the chest pain begin?" (Minutes, hours or days)      "It's been here, it's never left" 4. PATTERN: "Does the pain come and go, or has it been constant since it started?"  "Does it get worse with exertion?"      Constant  5. DURATION: "How long does it last" (e.g., seconds, minutes, hours)     Constant  6. SEVERITY: "How bad is the pain?"  (e.g., Scale 1-10; mild, moderate, or severe)    - MILD (1-3): doesn't  interfere with normal activities     - MODERATE (4-7): interferes with normal activities or awakens from sleep    - SEVERE (8-10): excruciating pain, unable to do any normal activities       "Past 10" 7. CARDIAC RISK FACTORS: "Do you have any history of heart problems or risk factors for heart disease?" (e.g., angina, prior heart attack; diabetes, high blood pressure, high cholesterol, smoker, or strong family history of heart disease)     Yes, extensive cardiac history  8. PULMONARY RISK FACTORS: "Do you have any history of lung disease?"  (e.g., blood clots in lung, asthma, emphysema, birth control pills)     OSA 9. CAUSE: "What do you think is causing the chest pain?"     Not sure  10. OTHER SYMPTOMS: "Do you have any other symptoms?" (e.g., dizziness, nausea, vomiting, sweating, fever, difficulty breathing, cough)       "Same pain I have been at the ED for 3 times", can't sleep d/t pain; endorses SOB at rest, states it is difficult to take a deep breath. Pt is currently on O2 but is not sure how much O2. Denies nausea, vomiting. Denies diaphoresis. Denies fever. Denies dizziness. Denies cough or respiratory symptoms at this time. Pt has been at dialysis now for 20 minutes.  Protocols used: Chest Pain-A-AH

## 2024-05-19 NOTE — ED Provider Notes (Signed)
 [place] [facility] AT [facility] Provider Note   CSN: [id number] Arrival date & time: 05/19/24  0940     History  Chief Complaint  Patient presents with   Chest Pain    Whitney Walker is a 68 y.o. female.  Pt is a 68 y.o. female with morbid obesity, hypertension, hyperlipidemia, CHF pEF in 2023, obstructive sleep apnea not on CPAP, COPD and severe peripheral arterial disease with history of left femoral-popliteal bypass surgery in 2014, on chronic anticoagulation with Eliquis  due to recurrent DVT and PE in 2014 and 2016, ESRD on hemodialysis on Monday/Wednesday/Friday with fluctuating blood pressures with dialysis who has been having ongoing chest pain for the last 2.5 weeks.  It is worse with moving and deep breathing.  States she can't take a deep breath because it is too painful.  Pain has not moved since it started spontaneously in the night.  She has not felt feverish or had chills.  No new cough or new sputum.  No abd pain, n/v/d.  She does not make any urine.  She has been seen now twice in the ER where she had a CTA which was neg and had evaluation of there tunneling cath in the left chest which has been present for 5-6 months and Dr. Higinio Love did not feel there was a problem with the catheter.  She did a short course of prednisone , ibuprofen  but states that did not really help.  She has been using some tylenol  #3 at home but that has not been helpful either.  She was at dialysis today and states pain was 10/10.  Paramedics state pt had full course of dialysis this morning.  Pt noted to be hypotensive but assymptomatic from it and has no IV access.  The history is provided by the patient, medical records and the EMS personnel.  Chest Pain      Home Medications Prior to Admission medications   Medication Sig Start Date End Date Taking? Authorizing Provider  acetaminophen  (TYLENOL ) 650 MG CR tablet Take 1,950 mg by mouth every 8 (eight) hours as needed  for pain.    [provider]  albuterol  (VENTOLIN  HFA) 108 (90 Base) MCG/ACT inhaler Inhale 2 puffs into the lungs every 6 (six) hours as needed for wheezing or shortness of breath. 09/18/23   Susanna Epley, FNP  atorvastatin  (LIPITOR) 20 MG tablet Take 1 tablet (20 mg total) by mouth daily. Patient taking differently: Take 20 mg by mouth at bedtime. 01/20/24 05/12/24  Knox Perl, MD  Biotin  5 MG CAPS Take 5 mg by mouth daily at 6 (six) AM.    [provider]  ELIQUIS  5 MG TABS tablet TAKE 1 TABLET (5 MG TOTAL) BY MOUTH 2 (TWO) TIMES DAILY. 04/20/24   Moore, Janece, FNP  meclizine  (ANTIVERT ) 12.5 MG tablet Take 1 tablet (12.5 mg total) by mouth 3 (three) times daily as needed for dizziness. 09/18/23   Moore, Janece, FNP  midodrine  (PROAMATINE ) 5 MG tablet Take 5 mg by mouth 2 (two) times daily as needed (hypotension). 12/03/23   [provider]  multivitamin (RENA-VIT) TABS tablet Take 1 tablet by mouth at bedtime.    [provider]  omeprazole  (PRILOSEC) 20 MG capsule TAKE 1 CAPSULE (20 MG TOTAL) BY MOUTH DAILY. 03/15/24   Susanna Epley, FNP  sevelamer  carbonate (RENVELA ) 800 MG tablet Take 800 mg by mouth at bedtime. 10/27/23   [provider]  sodium zirconium cyclosilicate  (LOKELMA ) 10 g PACK packet Take  10 g by mouth daily.    [provider]  Tiotropium Bromide-Olodaterol (STIOLTO RESPIMAT ) 2.5-2.5 MCG/ACT AERS Inhale 2 puffs into the lungs daily. 05/04/24   Hunsucker, Archer Kobs, MD  vitamin C  (ASCORBIC ACID ) 250 MG tablet Take 250 mg by mouth daily.    [provider]  vitamin E 200 UNIT capsule Take 200 Units by mouth daily.    [provider]      Allergies    Difelikefalin, Gadolinium derivatives, and Naltrexone    Review of Systems   Review of Systems  Cardiovascular:  Positive for chest pain.    Physical Exam Updated Vital Signs BP (!) 107/52   Pulse 86   Temp 97.9 F (36.6 C) (Oral)   Resp 18   Ht 5\' 1"   (1.549 m)   Wt 90.7 kg   SpO2 98%   BMI 37.79 kg/m  Physical Exam Vitals and nursing note reviewed.  Constitutional:      General: She is not in acute distress.    Appearance: She is well-developed.  HENT:     Head: Normocephalic and atraumatic.  Eyes:     Pupils: Pupils are equal, round, and reactive to light.  Cardiovascular:     Rate and Rhythm: Regular rhythm. Tachycardia present.     Pulses: Normal pulses.     Heart sounds: Normal heart sounds. No murmur heard.    No friction rub.     Comments: Strong pulse noted in all 3 extremities Pulmonary:     Effort: Pulmonary effort is normal.     Breath sounds: Normal breath sounds. No wheezing or rales.  Chest:     Chest wall: Tenderness present.  Abdominal:     General: Bowel sounds are normal. There is no distension.     Palpations: Abdomen is soft.     Tenderness: There is no abdominal tenderness. There is no right CVA tenderness, left CVA tenderness, guarding or rebound.  Musculoskeletal:        General: No tenderness. Normal range of motion.     Right lower leg: No edema.     Left lower leg: No edema.     Comments: No edema  Skin:    General: Skin is warm and dry.     Findings: No rash.  Neurological:     Mental Status: She is alert and oriented to person, place, and time.     Cranial Nerves: No cranial nerve deficit.  Psychiatric:        Behavior: Behavior normal.     ED Results / Procedures / Treatments   Labs (all labs ordered are listed, but only abnormal results are displayed) Labs Reviewed  COMPREHENSIVE METABOLIC PANEL WITH GFR - Abnormal; Notable for the following components:      Result Value   Sodium 133 (*)    Chloride 96 (*)    Glucose, Bld 140 (*)    Creatinine, Ser 4.59 (*)    Calcium  7.5 (*)    Albumin  2.9 (*)    GFR, Estimated 10 (*)    All other components within normal limits  CBC WITH DIFFERENTIAL/PLATELET - Abnormal; Notable for the following components:   WBC 14.3 (*)    RDW 16.5 (*)     Neutro Abs 10.6 (*)    Monocytes Absolute 1.2 (*)    Abs Immature Granulocytes 0.12 (*)    All other components within normal limits  TROPONIN I (HIGH SENSITIVITY) - Abnormal; Notable for the following components:  Troponin I (High Sensitivity) 28 (*)    All other components within normal limits  MAGNESIUM   PHOSPHORUS    EKG EKG Interpretation Date/Time:  Wednesday May 19 2024 09:47:35 EDT Ventricular Rate:  103 PR Interval:  180 QRS Duration:  74 QT Interval:  364 QTC Calculation: 477 R Axis:   19  Text Interpretation: Sinus tachycardia ST elevation, consider inferior injury No significant change since last tracing Confirmed by Almond Army ([id number]) on 05/19/2024 10:00:12 AM  Radiology DG Chest Port 1 View Result Date: 05/19/2024 CLINICAL DATA:  Chest pain for 10 days EXAM: PORTABLE CHEST 1 VIEW COMPARISON:  X-ray 05/12/2024 and older.  CTA 05/09/2024. FINDINGS: Double-lumen left IJ catheter tip overlying the right atrium. Heart is enlarged with vascular congestion. Stable interstitial prominence. There is some thickening along the minor fissure. Slight increase in mild left lung base opacity. No pneumothorax. Overlapping cardiac leads. Degenerative changes along the spine. IMPRESSION: Slight interval increase in left basilar opacity. Recommend follow-up Electronically Signed   By: Adrianna Horde M.D.   On: 05/19/2024 11:38    Procedures Procedures    Medications Ordered in ED Medications  HYDROmorphone  (DILAUDID ) injection 1 mg (has no administration in time range)  HYDROmorphone  (DILAUDID ) injection 2 mg (2 mg Intramuscular Given 05/19/24 1027)  lactated ringers  bolus 1,000 mL (0 mLs Intravenous Stopped 05/19/24 1056)  ondansetron  (ZOFRAN -ODT) disintegrating tablet 4 mg (4 mg Oral Given 05/19/24 1026)  methocarbamol (ROBAXIN) tablet 500 mg (500 mg Oral Given 05/19/24 1026)  metoCLOPramide  (REGLAN ) injection 10 mg (10 mg Intravenous Given 05/19/24 1255)    ED Course/  Medical Decision Making/ A&P                                 Medical Decision Making Amount and/or Complexity of Data Reviewed Independent Historian: EMS External Data Reviewed: notes. Labs: ordered. Decision-making details documented in ED Course. Radiology: ordered and independent interpretation performed. Decision-making details documented in ED Course.  Risk Prescription drug management.   Pt with multiple medical problems and comorbidities and presenting today with a complaint that caries a high risk for morbidity and mortality.  Here today with persistent chest pain.  Unclear the source of her pain.  Does not appear to be abd in nature as pt has no abd pain or flank pain on exam.  Concern for pleurisy vs developing PNA.  Low suspicion for catheter malfunction or PE as pt had CT PE 10 days ago and permcath evaluation 8 days ago which were normal.  Pt has no upper ext swelling or signs concerning for DVT and pt is on eliquis .  She has been on prednisone  and ibuprofen  but has d/ced. Which pt should not be on these meds regularly due to daily eliquis  use.  Low suspicion for dissection or tamponade.  Pt is hypotensive here but she is not symptomatic with hx of prior bp fluctuation with dialysis but could also be anemia, ACS or early sepsis.  BP measurements from dialysis earlier this week and last week was normal.  Based on my independent interpretation.  EKG today with sinus tachycardia but no other changes. Will give pain meds and small bolus of fluids.  CXR and labs pending.  2:58 PM I independently interpreted patient's labs and CMP shows findings consistent with end-stage renal disease but normal potassium today and LFTs.  Anion gap is normal.  Troponin is at her baseline at 28, white blood cell  count is elevated today at 14 however patient was recently on steroids and her typical numbers are 12-14, magnesium  and phosphorus are normal today.  I have independently visualized and interpreted  pt's images today.  Chest x-ray without any large effusions however radiology says slight increased interval and left basilar opacity when they recommend follow-up.  Will do a CT to ensure no evidence of developing pneumonia given patient's symptoms are on the left in that area.  On repeat evaluation patient was feeling better after Dilaudid  and muscle relaxer.  Blood pressure responded after 500 mL of fluid and has been stable in the low 100s.         Final Clinical Impression(s) / ED Diagnoses Final diagnoses:  None    Rx / DC Orders ED Discharge Orders     None         Almond Army, MD 05/19/24 1531

## 2024-05-19 NOTE — ED Triage Notes (Signed)
 10 days of CP, from HD today and received full treatment. APA given, soft pressures 99hr CBG 173, temp 97.2

## 2024-05-20 ENCOUNTER — Inpatient Hospital Stay: Payer: Self-pay | Admitting: Family Medicine

## 2024-05-20 ENCOUNTER — Inpatient Hospital Stay: Payer: 59

## 2024-05-20 ENCOUNTER — Observation Stay (HOSPITAL_BASED_OUTPATIENT_CLINIC_OR_DEPARTMENT_OTHER)

## 2024-05-20 ENCOUNTER — Inpatient Hospital Stay: Payer: 59 | Admitting: Hematology

## 2024-05-20 DIAGNOSIS — E785 Hyperlipidemia, unspecified: Secondary | ICD-10-CM | POA: Diagnosis not present

## 2024-05-20 DIAGNOSIS — N186 End stage renal disease: Secondary | ICD-10-CM | POA: Diagnosis not present

## 2024-05-20 DIAGNOSIS — D631 Anemia in chronic kidney disease: Secondary | ICD-10-CM

## 2024-05-20 DIAGNOSIS — R079 Chest pain, unspecified: Secondary | ICD-10-CM

## 2024-05-20 DIAGNOSIS — I82409 Acute embolism and thrombosis of unspecified deep veins of unspecified lower extremity: Secondary | ICD-10-CM | POA: Diagnosis not present

## 2024-05-20 DIAGNOSIS — I739 Peripheral vascular disease, unspecified: Secondary | ICD-10-CM | POA: Diagnosis not present

## 2024-05-20 DIAGNOSIS — N183 Chronic kidney disease, stage 3 unspecified: Secondary | ICD-10-CM | POA: Diagnosis not present

## 2024-05-20 DIAGNOSIS — R0789 Other chest pain: Secondary | ICD-10-CM | POA: Diagnosis not present

## 2024-05-20 DIAGNOSIS — J449 Chronic obstructive pulmonary disease, unspecified: Secondary | ICD-10-CM | POA: Diagnosis not present

## 2024-05-20 DIAGNOSIS — I132 Hypertensive heart and chronic kidney disease with heart failure and with stage 5 chronic kidney disease, or end stage renal disease: Secondary | ICD-10-CM | POA: Diagnosis not present

## 2024-05-20 DIAGNOSIS — I5032 Chronic diastolic (congestive) heart failure: Secondary | ICD-10-CM | POA: Diagnosis not present

## 2024-05-20 DIAGNOSIS — I2510 Atherosclerotic heart disease of native coronary artery without angina pectoris: Secondary | ICD-10-CM | POA: Diagnosis not present

## 2024-05-20 DIAGNOSIS — Z992 Dependence on renal dialysis: Secondary | ICD-10-CM | POA: Diagnosis not present

## 2024-05-20 DIAGNOSIS — Z85038 Personal history of other malignant neoplasm of large intestine: Secondary | ICD-10-CM | POA: Diagnosis not present

## 2024-05-20 LAB — BASIC METABOLIC PANEL WITH GFR
Anion gap: 14 (ref 5–15)
BUN: 30 mg/dL — ABNORMAL HIGH (ref 8–23)
CO2: 24 mmol/L (ref 22–32)
Calcium: 8 mg/dL — ABNORMAL LOW (ref 8.9–10.3)
Chloride: 94 mmol/L — ABNORMAL LOW (ref 98–111)
Creatinine, Ser: 7.11 mg/dL — ABNORMAL HIGH (ref 0.44–1.00)
GFR, Estimated: 6 mL/min — ABNORMAL LOW (ref >60)
Glucose, Bld: 165 mg/dL — ABNORMAL HIGH (ref 70–99)
Potassium: 4.8 mmol/L (ref 3.5–5.1)
Sodium: 132 mmol/L — ABNORMAL LOW (ref 135–145)

## 2024-05-20 LAB — ECHOCARDIOGRAM COMPLETE
AR max vel: 2.15 cm2
AV Area VTI: 1.84 cm2
AV Area mean vel: 1.99 cm2
AV Mean grad: 12 mmHg
AV Peak grad: 21.5 mmHg
Ao pk vel: 2.32 m/s
Area-P 1/2: 4.6 cm2
Calc EF: 69.2 %
Height: 61 in
MV VTI: 1.84 cm2
S' Lateral: 2.2 cm
Single Plane A2C EF: 64.6 %
Single Plane A4C EF: 72.1 %
Weight: 3200 [oz_av]

## 2024-05-20 LAB — CBC WITH DIFFERENTIAL/PLATELET
Abs Immature Granulocytes: 0.1 10*3/uL — ABNORMAL HIGH (ref 0.00–0.07)
Basophils Absolute: 0 10*3/uL (ref 0.0–0.1)
Basophils Relative: 0 %
Eosinophils Absolute: 0.2 10*3/uL (ref 0.0–0.5)
Eosinophils Relative: 1 %
HCT: 37.9 % (ref 36.0–46.0)
Hemoglobin: 11.8 g/dL — ABNORMAL LOW (ref 12.0–15.0)
Immature Granulocytes: 1 %
Lymphocytes Relative: 14 %
Lymphs Abs: 2.1 10*3/uL (ref 0.7–4.0)
MCH: 29.5 pg (ref 26.0–34.0)
MCHC: 31.1 g/dL (ref 30.0–36.0)
MCV: 94.8 fL (ref 80.0–100.0)
Monocytes Absolute: 1 10*3/uL (ref 0.1–1.0)
Monocytes Relative: 7 %
Neutro Abs: 11.3 10*3/uL — ABNORMAL HIGH (ref 1.7–7.7)
Neutrophils Relative %: 77 %
Platelets: 215 10*3/uL (ref 150–400)
RBC: 4 MIL/uL (ref 3.87–5.11)
RDW: 16.6 % — ABNORMAL HIGH (ref 11.5–15.5)
WBC: 14.7 10*3/uL — ABNORMAL HIGH (ref 4.0–10.5)
nRBC: 0 % (ref 0.0–0.2)

## 2024-05-20 MED ORDER — PANTOPRAZOLE SODIUM 40 MG PO TBEC: 40.0000 mg | DELAYED_RELEASE_TABLET | Freq: Two times a day (BID) | ORAL | 0 refills | Status: DC

## 2024-05-20 MED ORDER — CHLORHEXIDINE GLUCONATE CLOTH 2 % EX PADS: 6.0000 | MEDICATED_PAD | Freq: Every day | CUTANEOUS | Status: DC

## 2024-05-20 MED ORDER — IBUPROFEN 200 MG PO TABS: 200.0000 mg | ORAL_TABLET | Freq: Four times a day (QID) | ORAL | 0 refills | Status: AC | PRN

## 2024-05-20 MED ORDER — KETOROLAC TROMETHAMINE 15 MG/ML IJ SOLN
7.5000 mg | Freq: Four times a day (QID) | INTRAMUSCULAR | Status: DC | PRN
  Administered 2024-05-20: 7.5 mg via INTRAVENOUS
  Filled 2024-05-20 (×2): qty 1

## 2024-05-20 MED ORDER — KETOROLAC TROMETHAMINE 15 MG/ML IJ SOLN
15.0000 mg | Freq: Four times a day (QID) | INTRAMUSCULAR | Status: DC | PRN
Start: 2024-05-20 — End: 2024-05-20

## 2024-05-20 MED ORDER — PANTOPRAZOLE SODIUM 40 MG PO TBEC: 40.0000 mg | DELAYED_RELEASE_TABLET | Freq: Once | ORAL | Status: DC

## 2024-05-20 MED ORDER — PANTOPRAZOLE SODIUM 40 MG IV SOLR: 40.0000 mg | Freq: Once | INTRAVENOUS | Status: DC

## 2024-05-20 MED ORDER — HYDROMORPHONE HCL 1 MG/ML IJ SOLN: 0.5000 mg | Freq: Three times a day (TID) | INTRAMUSCULAR | Status: DC | PRN

## 2024-05-20 MED ORDER — LIDOCAINE 5 % EX PTCH
1.0000 | MEDICATED_PATCH | CUTANEOUS | 0 refills | Status: AC
Start: 2024-05-20 — End: 2024-05-30

## 2024-05-20 NOTE — TOC Transition Note (Addendum)
 Transition of Care [facility]) - Discharge Note   Patient Details  Name: Whitney Walker MRN: [id number] Date of Birth: [birth year]  Transition of Care [facility] CM/SW Contact:  Tom-Johnson, Angelique Ken, RN Phone Number: 05/20/2024, 2:38 PM   Clinical Narrative:     Patient is scheduled for discharge today.  Readmission Risk Assessment done. Outpatient f/u, hospital f/u and discharge instructions on AVS. Cab voucher will be given to patient by the discharge lounge to transport at discharge.  No further TOC needs noted.        Final next level of care: Home/Self Care Barriers to Discharge: Barriers Resolved   Patient Goals and CMS Choice Patient states their goals for this hospitalization and ongoing recovery are:: To return home CMS Medicare.gov Compare Post Acute Care list provided to:: Patient Choice offered to / list presented to : NA      Discharge Placement                Patient to be transferred to facility by: Daughter Name of family member notified: [facility] Services Additional resources added to the After Visit Summary for                  DME Arranged: N/A DME Agency: NA       HH Arranged: NA HH Agency: NA        Social Drivers of Health (SDOH) Interventions SDOH Screenings   Food Insecurity: No Food Insecurity (05/19/2024)  Housing: Low Risk  (05/19/2024)  Transportation Needs: No Transportation Needs (05/19/2024)  Utilities: Not At Risk (05/19/2024)  Alcohol Screen: Low Risk  (03/30/2024)  Depression (PHQ2-9): Low Risk  (05/06/2024)  Financial Resource Strain: Low Risk  (03/30/2024)  Physical Activity: Insufficiently Active (05/06/2024)  Social Connections: Moderately Isolated (05/19/2024)  Stress: No Stress Concern Present (03/30/2024)  Tobacco Use: High Risk (05/12/2024)  Health Literacy: Adequate Health Literacy (05/06/2024)     Readmission Risk Interventions    05/20/2024    2:36 PM 05/21/2022    4:20 PM  Readmission  Risk Prevention Plan  Transportation Screening Complete Complete  PCP or Specialist Appt within 3-5 Days Complete   HRI or Home Care Consult Complete   Social Work Consult for Recovery Care Planning/Counseling Complete   Palliative Care Screening Not Applicable   Medication Review Oceanographer) Referral to Pharmacy Referral to Pharmacy  HRI or Home Care Consult  Complete  SW Recovery Care/Counseling Consult  Complete  Palliative Care Screening  Not Applicable  Skilled Nursing Facility  Not Applicable

## 2024-05-20 NOTE — Progress Notes (Signed)
  Echocardiogram 2D Echocardiogram has been performed.  Whitney Walker 05/20/2024, 3:14 PM

## 2024-05-20 NOTE — Progress Notes (Addendum)
 Pt requesting to be discharged. No specific reason given. Pain was addressed with IV toradol  this morning as ordered. At some point during the process pt stated "I'm only observation and we dont know why Im having this pain, just let me go home".  PIV removed per pt request and Charge RN reached out to MD. Nurse manager attempted to speak with pt briefly.  1427: Pt awaiting discharge papers. Pt agreeable to Echo at bedside prior to leaving. Echo tech at bedside, completing echo.  AVS reviewed by RN with pt. All questions answered. No acute distress noted at time of dioscharge. Pt left unit with all belongings via wheelchair.

## 2024-05-20 NOTE — Care Management Obs Status (Signed)
 MEDICARE OBSERVATION STATUS NOTIFICATION   Patient Details  Name: Whitney Walker MRN: [id number] Date of Birth: 04/06/1956   Medicare Observation Status Notification Given:  Yes  [place]oon/Obs letter signed and a copy given   Wynonia Hedges 05/20/2024, 12:20 PM

## 2024-05-20 NOTE — Assessment & Plan Note (Deleted)
-  She previously had episodes of PE, was treated with Xarelto. 10 months after she came off Xarelto, she developed unprovoked DVT and PE. -I previously recommended her to continue anticoagulation indefinitely, if no contraindications. -Patient was previously on Coumadin, but switched to Eliquis due to her transportation issues with Coumadin check up. She takes Eliquis 2.5mg  BID, dose adjusted based on her ESRD on HD. -She previously stated she has been given high dose hepatin during dialysis due to clotting issue, which is fine.  -Continue Eliquis 2.5mg  BID and continue to watch for blood clot.

## 2024-05-20 NOTE — Assessment & Plan Note (Deleted)
 Anemia of chronic disease and anemia of iron  deficiency -likely related to her chronic kidney disease  -She takes oral iron  supplement and IV iron  as needed which is managed by her nephrologist at dialysis center.  -anemia resolved lately

## 2024-05-20 NOTE — Progress Notes (Signed)
 OT Cancellation Note  Patient Details Name: Whitney Walker MRN: [id number] DOB: 1956/12/16   Cancelled Treatment:    Reason Eval/Treat Not Completed: OT screened, no needs identified, will sign off, per PT Pt has no acute OT needs, signing off.  Scherry Curtis 05/20/2024, 1:08 PM

## 2024-05-20 NOTE — Plan of Care (Signed)

## 2024-05-20 NOTE — Progress Notes (Signed)
 [facility] PROGRESS NOTE    Progress Note  Whitney Walker  ZOX:[id number] DOB: May 31, 1956 DOA: 05/19/2024 PCP: Whitney Epley, FNP     Brief Narrative:   Whitney Walker is an 68 y.o. female past medical history significant for essential hypertension, hyperlipidemia, PAD with a history of left femoral-popliteal bypass in 2014, DVT and PE on Eliquis , chronic diastolic dysfunction, end-stage renal disease Monday Wednesday and Friday comes in with chest pain for about 3 weeks prior to admission intermittent worsened with deep breaths, in the ED was found tachycardic, hypotensive and hypoxic 79% on room air, white blood cell count of 14,000, tropes of 28, underwent CT chest without contrast that suggested pulmonary artery hypertension, and some subsegmental atelectasis no other acute findings.    Assessment/Plan:   Atypical chest pain: Cardiac biomarkers have basically remained flat, they are slightly elevated in setting of his stage renal disease, CRP is 2.5. Pain is reproducible by palpation his right next to the sternum and under her breast. He relates pleuritic chest pain, she relates any kind of movement makes it worse. 2D echo pending CK 32 Started on prednisone  we will transition her to NSAIDs.  ESRD (end stage renal disease) on dialysis [facility]) Nephrology was notified we will continue dialysis Monday Wednesday Friday.  Essential hypertension: Continue midodrine . Blood pressure is well-controlled tachycardia has resolved.  History of DVT and PE: Cont. Eliquis .  Hyperlipidemia: Statins.  PAD: Noted  Chronic diastolic heart failure (HCC) Appears euvolemic continue current meds.  Anemia of chronic kidney failure, stage 3 (moderate) (HCC) Hemoglobin is stable.   DVT prophylaxis: eliquis  Family Communication:none Status is: Observation The patient remains OBS appropriate and will d/c before 2 midnights.    Code Status:     Code Status Orders   (From admission, onward)           Start     Ordered   05/19/24 1815  Full code  Continuous       Question:  By:  Answer:  Consent: discussion documented in EHR   05/19/24 1814           Code Status History     Date Active Date Inactive Code Status Order ID Comments User Context   12/01/2023 1602 12/02/2023 1555 Full Code [id number]  Elgergawy, Ardia Kraft, MD ED   05/16/2022 1742 05/22/2022 2002 Full Code [id number]  Oral Billings, MD ED   05/02/2022 0055 05/03/2022 1855 Full Code [id number]  Walton Guppy, MD ED   07/16/2021 1758 07/22/2021 2144 Full Code [id number]  Ricardo Chamber, PA-C ED   05/28/2021 1314 05/31/2021 1747 DNR [id number]  Lorita Rosa, MD Inpatient   05/01/2021 1316 05/01/2021 2018 Full Code [id number]  Knox Perl, MD Inpatient   10/30/2020 0653 11/01/2020 2034 Full Code [id number]  Angelene Kelly, MD Inpatient   05/09/2020 0257 05/10/2020 1647 Full Code [id number]  Angelene Kelly, MD ED   03/02/2019 2100 03/03/2019 1325 Full Code [id number]  Arrien, Curlee Doss, MD Inpatient   07/16/2018 1541 07/17/2018 1225 Full Code [id number]  Bonnye Butts, PA-C Inpatient   04/29/2018 2322 05/07/2018 1546 Full Code [id number]  Enrigue Harvard, DO Inpatient   03/02/2017 2320 03/03/2017 1730 Full Code [id number]  Angelene Kelly, MD Inpatient   10/17/2015 2248 10/25/2015 1520 DNR [id number]  Glorianne Largo, NP Inpatient   10/17/2015 1942 10/17/2015 2248 Full Code [id number]  Dea Evert, DO ED   11/14/2013 2148 11/16/2013 2107 Full Code [id number]  Jean Michaelis  Melven Stable, DO ED   08/13/2013 1632 08/16/2013 1539 Full Code [id number]  Bonnye Butts, PA-C Inpatient         IV Access:   Peripheral IV   Procedures and diagnostic studies:   CT Chest Wo Contrast Result Date: 05/19/2024 CLINICAL DATA:  Nontraumatic chest wall pain. EXAM: CT CHEST WITHOUT CONTRAST TECHNIQUE: Multidetector CT imaging of the chest was performed following the standard protocol without IV contrast.  RADIATION DOSE REDUCTION: This exam was performed according to the departmental dose-optimization program which includes automated exposure control, adjustment of the mA and/or kV according to patient size and/or use of iterative reconstruction technique. COMPARISON:  May 09, 2024. FINDINGS: Cardiovascular: Atherosclerosis of thoracic aorta without aneurysm formation. Mild cardiomegaly. Coronary artery calcifications are noted suggesting coronary artery disease. Enlarged pulmonary artery is noted suggesting pulmonary artery hypertension. Left internal jugular dialysis catheter is noted with distal tip in right atrium. No pericardial effusion. Mediastinum/Nodes: No enlarged mediastinal or axillary lymph nodes. Thyroid  gland, trachea, and esophagus demonstrate no significant findings. Lungs/Pleura: No pneumothorax or pleural effusion is noted. Minimal bibasilar subsegmental atelectasis or scarring is noted. Upper Abdomen: No acute abnormality. Musculoskeletal: No chest wall mass or suspicious bone lesions identified. IMPRESSION: Minimal bibasilar subsegmental atelectasis or scarring. Enlarged pulmonary artery is noted suggesting pulmonary artery hypertension. Coronary artery calcifications are noted suggesting coronary artery disease. Aortic Atherosclerosis (ICD10-I70.0). Electronically Signed   By: Rosalene Colon M.D.   On: 05/19/2024 15:25   DG Chest Port 1 View Result Date: 05/19/2024 CLINICAL DATA:  Chest pain for 10 days EXAM: PORTABLE CHEST 1 VIEW COMPARISON:  X-ray 05/12/2024 and older.  CTA 05/09/2024. FINDINGS: Double-lumen left IJ catheter tip overlying the right atrium. Heart is enlarged with vascular congestion. Stable interstitial prominence. There is some thickening along the minor fissure. Slight increase in mild left lung base opacity. No pneumothorax. Overlapping cardiac leads. Degenerative changes along the spine. IMPRESSION: Slight interval increase in left basilar opacity. Recommend follow-up  Electronically Signed   By: Adrianna Horde M.D.   On: 05/19/2024 11:38     Medical Consultants:   None.   Subjective:    [name] she relates her pain is not better.  Objective:    Vitals:   05/19/24 1812 05/19/24 1854 05/19/24 1956 05/20/24 0425  BP: 130/60  (!) 131/45 110/72  Pulse: 97  (!) 103 (!) 107  Resp: 20 20  18   Temp: 98.2 F (36.8 C)  98.1 F (36.7 C) 98.7 F (37.1 C)  TempSrc: Oral  Oral   SpO2: 98%  94% 92%  Weight:      Height:       SpO2: 92 % O2 Flow Rate (L/min): 2 L/min   Intake/Output Summary (Last 24 hours) at 05/20/2024 0759 Last data filed at 05/19/2024 1812 Gross per 24 hour  Intake 666.2 ml  Output --  Net 666.2 ml   Filed Weights   05/19/24 0952  Weight: 90.7 kg    Exam: General exam: In no acute distress. Respiratory system: Good air movement and clear to auscultation. Cardiovascular system: S1 & S2 heard, RRR. No JVD. Gastrointestinal system: Abdomen is nondistended, soft and nontender.  Extremities: No pedal edema. Skin: No  lesions or ulcers, no rashes Psychiatry: Judgement and insight appear normal. Mood & affect appropriate.    Data Reviewed:    Labs: Basic Metabolic Panel: Recent Labs  Lab 05/19/24 1019 05/20/24 0616  NA 133* 132*  K 4.0 4.8  CL 96* 94*  CO2 27 24  GLUCOSE 140* 165*  BUN 15 30*  CREATININE 4.59* 7.11*  CALCIUM  7.5* 8.0*  MG 1.7  --   PHOS 2.5  --    GFR Estimated Creatinine Clearance: 7.8 mL/min (A) (by C-G formula based on SCr of 7.11 mg/dL (H)). Liver Function Tests: Recent Labs  Lab 05/19/24 1019  AST 17  ALT 16  ALKPHOS 89  BILITOT 0.6  PROT 6.5  ALBUMIN  2.9*   No results for input(s): "LIPASE", "AMYLASE" in the last 168 hours. No results for input(s): "AMMONIA" in the last 168 hours. Coagulation profile No results for input(s): "INR", "PROTIME" in the last 168 hours. COVID-19 Labs  Recent Labs    05/19/24 1948  CRP 2.5*    Lab Results  Component Value  Date   SARSCOV2NAA NEGATIVE 12/01/2023   SARSCOV2NAA NEGATIVE 05/16/2022   SARSCOV2NAA NEGATIVE 07/16/2021   SARSCOV2NAA NEGATIVE 05/28/2021    CBC: Recent Labs  Lab 05/19/24 1019 05/20/24 0616  WBC 14.3* 14.7*  NEUTROABS 10.6* 11.3*  HGB 12.5 11.8*  HCT 39.9 37.9  MCV 94.3 94.8  PLT 205 215   Cardiac Enzymes: Recent Labs  Lab 05/19/24 1948  CKTOTAL 32*   BNP (last 3 results) No results for input(s): "PROBNP" in the last 8760 hours. CBG: No results for input(s): "GLUCAP" in the last 168 hours. D-Dimer: No results for input(s): "DDIMER" in the last 72 hours. Hgb A1c: No results for input(s): "HGBA1C" in the last 72 hours. Lipid Profile: No results for input(s): "CHOL", "HDL", "LDLCALC", "TRIG", "CHOLHDL", "LDLDIRECT" in the last 72 hours. Thyroid  function studies: No results for input(s): "TSH", "T4TOTAL", "T3FREE", "THYROIDAB" in the last 72 hours.  Invalid input(s): "FREET3" Anemia work up: No results for input(s): "VITAMINB12", "FOLATE", "FERRITIN", "TIBC", "IRON ", "RETICCTPCT" in the last 72 hours. Sepsis Labs: Recent Labs  Lab 05/19/24 1019 05/20/24 0616  WBC 14.3* 14.7*   Microbiology No results found for this or any previous visit (from the past 240 hours).   Medications:    alum & mag hydroxide-simeth  30 mL Oral Once   And   lidocaine   15 mL Oral Once   apixaban   5 mg Oral BID   arformoterol   15 mcg Nebulization BID   And   umeclidinium bromide   1 puff Inhalation Daily   atorvastatin   20 mg Oral QHS   pantoprazole   40 mg Oral Daily   predniSONE   40 mg Oral QAC breakfast   sevelamer  carbonate  800 mg Oral Q supper   sodium zirconium cyclosilicate   10 g Oral Daily   Continuous Infusions:    LOS: 0 days   Macdonald Savoy  [facility]  05/20/2024, 7:59 AM

## 2024-05-20 NOTE — Care Management CC44 (Signed)
 Condition Code 44 Documentation Completed  Patient Details  Name: Whitney Walker MRN: [id number] Date of Birth: 07-01-1956   Condition Code 44 given:  Yes Patient signature on Condition Code 44 notice:  Yes Documentation of 2 MD's agreement:  Yes Code 44 added to claim:  Yes    Tom-Johnson, Angelique Ken, RN 05/20/2024, 2:58 PM

## 2024-05-20 NOTE — Discharge Summary (Signed)
 Physician Discharge Summary  Whitney Walker WUJ:[id number] DOB: 1956/08/24 DOA: 05/19/2024  PCP: Susanna Epley, FNP  Admit date: 05/19/2024 Discharge date: 05/20/2024  Admitted From: Home Disposition:  Home  Recommendations for Outpatient Follow-up:  Follow up with PCP in 1-2 weeks Please obtain BMP/CBC in one week   Home Health:No Equipment/Devices:None  Discharge Condition:Stable CODE STATUS:Full Diet recommendation: Heart Healthy   Brief/Interim Summary: 68 y.o. female past medical history significant for essential hypertension, hyperlipidemia, PAD with a history of left femoral-popliteal bypass in 2014, DVT and PE on Eliquis , chronic diastolic dysfunction, end-stage renal disease Monday Wednesday and Friday comes in with chest pain for about 3 weeks prior to admission intermittent worsened with deep breaths, in the ED was found tachycardic, hypotensive and hypoxic 79% on room air, white blood cell count of 14,000, tropes of 28, underwent CT chest without contrast that suggested pulmonary artery hypertension, and some subsegmental atelectasis no other acute findings.   Discharge Diagnoses:  Active Problems:   Chronic diastolic heart failure (HCC)   DVT (deep venous thrombosis) (HCC)   Anemia of chronic kidney failure, stage 3 (moderate) (HCC)   ESRD (end stage renal disease) on dialysis (HCC)   Chest pain  Atypical chest pain likely due to costochondritis: Cardiac biomarkers are remained flat they are slightly elevated due to chronic renal disease. CRP was 2.5. Her pain was reproducible by palpation from the right of the sternum all the way under her breast. Pleuritic in nature. 2D echo showed no acute findings. She was given prednisone  with no improvement was started on low-dose NSAIDs and she relates her pain was significantly improved after this. She will continue low-dose ibuprofen  for 2 additional days and outpatient she was started on Protonix   empirically.  End-stage renal disease: He will continue with hemodialysis on Monday Wednesday Friday.  Essential hypertension: No changes made to her medication.  History of DVT and PE: Continue Eliquis .  Hyperlipidemia: Continue statins.  PAD: Noted.  Chronic diastolic dysfunction: Continue current meds no changes made.  Anemia of chronic renal disease: Hemoglobin remained stable.  Discharge Instructions  Discharge Instructions     Diet - low sodium heart healthy   Complete by: As directed    Increase activity slowly   Complete by: As directed       Allergies as of 05/20/2024       Reactions   Difelikefalin Itching   Gadolinium Derivatives Hives, Other (See Comments)   HIVES, Desc: HIVES W/ "DYE" USED FOR 1ST CT SCAN BUT NOT 2ND, NO PREMEDS USED, PT UNCERTAIN OF CIRCUMSTANCES,,?POSSIBLE MRI CONTRAST ALLERGY, ALL STUDIES DONE "SOMEWHERE" IN [place] //A.C., Onset Date: [id number]   Naltrexone Other (See Comments)   Unknown 1974        Medication List     TAKE these medications    acetaminophen  650 MG CR tablet Commonly known as: TYLENOL  Take 1,950 mg by mouth every 8 (eight) hours as needed for pain.   albuterol  108 (90 Base) MCG/ACT inhaler Commonly known as: VENTOLIN  HFA Inhale 2 puffs into the lungs every 6 (six) hours as needed for wheezing or shortness of breath.   atorvastatin  20 MG tablet Commonly known as: LIPITOR Take 1 tablet (20 mg total) by mouth daily. What changed: when to take this   Biotin  5 MG Caps Take 5 mg by mouth daily at 6 (six) AM.   Eliquis  5 MG Tabs tablet Generic drug: apixaban  TAKE 1 TABLET (5 MG TOTAL) BY MOUTH 2 (TWO) TIMES DAILY.   ibuprofen   200 MG tablet Commonly known as: Motrin  IB Take 1 tablet (200 mg total) by mouth every 6 (six) hours as needed for up to 2 days.   lidocaine  5 % Commonly known as: Lidoderm  Place 1 patch onto the skin daily for 10 days. Remove & Discard patch within 12 hours or as directed  by MD   Lokelma  10 g Pack packet Generic drug: sodium zirconium cyclosilicate  Take 10 g by mouth daily.   meclizine  12.5 MG tablet Commonly known as: ANTIVERT  Take 1 tablet (12.5 mg total) by mouth 3 (three) times daily as needed for dizziness.   midodrine  5 MG tablet Commonly known as: PROAMATINE  Take 5 mg by mouth 2 (two) times daily as needed (hypotension).   multivitamin Tabs tablet Take 1 tablet by mouth at bedtime.   omeprazole  20 MG capsule Commonly known as: PRILOSEC TAKE 1 CAPSULE (20 MG TOTAL) BY MOUTH DAILY.   pantoprazole  40 MG tablet Commonly known as: PROTONIX  Take 1 tablet (40 mg total) by mouth 2 (two) times daily.   sevelamer  carbonate 800 MG tablet Commonly known as: RENVELA  Take 800 mg by mouth at bedtime.   Stiolto Respimat  2.5-2.5 MCG/ACT Aers Generic drug: Tiotropium Bromide-Olodaterol Inhale 2 puffs into the lungs daily.   vitamin C  250 MG tablet Commonly known as: ASCORBIC ACID  Take 250 mg by mouth daily.   vitamin E 200 UNIT capsule Take 200 Units by mouth daily.        Allergies  Allergen Reactions   Difelikefalin Itching   Gadolinium Derivatives Hives and Other (See Comments)    HIVES, Desc: HIVES W/ "DYE" USED FOR 1ST CT SCAN BUT NOT 2ND, NO PREMEDS USED, PT UNCERTAIN OF CIRCUMSTANCES,,?POSSIBLE MRI CONTRAST ALLERGY, ALL STUDIES DONE "SOMEWHERE" IN [place] //A.C., Onset Date: [id number]   Naltrexone Other (See Comments)    Unknown 1974     Consultations: None   Procedures/Studies: CT Chest Wo Contrast Result Date: 05/19/2024 CLINICAL DATA:  Nontraumatic chest wall pain. EXAM: CT CHEST WITHOUT CONTRAST TECHNIQUE: Multidetector CT imaging of the chest was performed following the standard protocol without IV contrast. RADIATION DOSE REDUCTION: This exam was performed according to the departmental dose-optimization program which includes automated exposure control, adjustment of the mA and/or kV according to patient size and/or  use of iterative reconstruction technique. COMPARISON:  May 09, 2024. FINDINGS: Cardiovascular: Atherosclerosis of thoracic aorta without aneurysm formation. Mild cardiomegaly. Coronary artery calcifications are noted suggesting coronary artery disease. Enlarged pulmonary artery is noted suggesting pulmonary artery hypertension. Left internal jugular dialysis catheter is noted with distal tip in right atrium. No pericardial effusion. Mediastinum/Nodes: No enlarged mediastinal or axillary lymph nodes. Thyroid  gland, trachea, and esophagus demonstrate no significant findings. Lungs/Pleura: No pneumothorax or pleural effusion is noted. Minimal bibasilar subsegmental atelectasis or scarring is noted. Upper Abdomen: No acute abnormality. Musculoskeletal: No chest wall mass or suspicious bone lesions identified. IMPRESSION: Minimal bibasilar subsegmental atelectasis or scarring. Enlarged pulmonary artery is noted suggesting pulmonary artery hypertension. Coronary artery calcifications are noted suggesting coronary artery disease. Aortic Atherosclerosis (ICD10-I70.0). Electronically Signed   By: Rosalene Colon M.D.   On: 05/19/2024 15:25   DG Chest Port 1 View Result Date: 05/19/2024 CLINICAL DATA:  Chest pain for 10 days EXAM: PORTABLE CHEST 1 VIEW COMPARISON:  X-ray 05/12/2024 and older.  CTA 05/09/2024. FINDINGS: Double-lumen left IJ catheter tip overlying the right atrium. Heart is enlarged with vascular congestion. Stable interstitial prominence. There is some thickening along the minor fissure. Slight increase in mild left lung  base opacity. No pneumothorax. Overlapping cardiac leads. Degenerative changes along the spine. IMPRESSION: Slight interval increase in left basilar opacity. Recommend follow-up Electronically Signed   By: Adrianna Horde M.D.   On: 05/19/2024 11:38   DG Chest Portable 1 View Result Date: 05/12/2024 CLINICAL DATA:  Left chest pain. EXAM: PORTABLE CHEST 1 VIEW COMPARISON:  CTA chest  05/09/2024. FINDINGS: Left IJ dialysis line again terminates in the right atrium. There is mild cardiomegaly. Central vessels are prominent as before without evidence of pulmonary edema. No pleural effusion is seen. There is increased linear atelectasis along the horizontal fissure on the right. Linear scarring or atelectasis left base. No focal pneumonia is evident. Stable mediastinum with aortic tortuosity and atherosclerosis. No new osseous abnormality. IMPRESSION: 1. Increased linear atelectasis along the horizontal fissure on the right. 2. Linear scarring or atelectasis left base. No pneumonic infiltrates. 3. Cardiomegaly and central vascular prominence without evidence of pulmonary edema. 4. Aortic atherosclerosis. Electronically Signed   By: Denman Fischer M.D.   On: 05/12/2024 07:26   CT Angio Chest PE W and/or Wo Contrast Result Date: 05/09/2024 CLINICAL DATA:  Pulmonary embolism (PE) suspected, high prob Chest pain. EXAM: CT ANGIOGRAPHY CHEST WITH CONTRAST TECHNIQUE: Multidetector CT imaging of the chest was performed using the standard protocol during bolus administration of intravenous contrast. Multiplanar CT image reconstructions and MIPs were obtained to evaluate the vascular anatomy. RADIATION DOSE REDUCTION: This exam was performed according to the departmental dose-optimization program which includes automated exposure control, adjustment of the mA and/or kV according to patient size and/or use of iterative reconstruction technique. CONTRAST:  75mL OMNIPAQUE  IOHEXOL  350 MG/ML SOLN COMPARISON:  Noncontrast chest CT earlier today, 4 hours prior FINDINGS: Cardiovascular: There are no filling defects within the pulmonary arteries to suggest pulmonary embolus. Stable upper normal heart size. No pericardial effusion. Coronary artery and mitral annulus calcifications. Aortic atherosclerosis without acute aortic findings. Again seen dilated main pulmonary artery at 4 cm. Left-sided central line in  place. Mediastinum/Nodes: Stable borderline enlarged and increased number of mediastinal lymph nodes. No significant hilar adenopathy. Decompressed esophagus. No visible thyroid  nodule. Lungs/Pleura: No developing airspace disease, pulmonary edema or pleural effusions. Scattered atelectasis and scarring. Upper Abdomen: No acute findings. Musculoskeletal: No acute findings. Thoracic spondylosis with anterior spurring. Review of the MIP images confirms the above findings. IMPRESSION: 1. No pulmonary embolus. 2. No acute intrathoracic abnormality. Stable chronic findings from earlier today. 3. Dilated main pulmonary artery, suggestive of pulmonary arterial hypertension. 4. Stable borderline enlarged and increased number of mediastinal lymph nodes, likely reactive. 5. Coronary artery calcifications. Aortic Atherosclerosis (ICD10-I70.0). Aortic Atherosclerosis (ICD10-I70.0). Electronically Signed   By: Chadwick Colonel M.D.   On: 05/09/2024 15:53   CT Chest Wo Contrast Result Date: 05/09/2024 CLINICAL DATA:  Chest pain. EXAM: CT CHEST WITHOUT CONTRAST TECHNIQUE: Multidetector CT imaging of the chest was performed following the standard protocol without IV contrast. RADIATION DOSE REDUCTION: This exam was performed according to the departmental dose-optimization program which includes automated exposure control, adjustment of the mA and/or kV according to patient size and/or use of iterative reconstruction technique. COMPARISON:  Chest CT 08/12/2023 FINDINGS: Cardiovascular: The heart is upper limits of normal in size. No pericardial effusion. Stable tortuosity and age advanced atherosclerotic calcification involving the thoracic aorta. Stable left IJ dialysis catheter. Stable severe age advanced three-vessel coronary artery calcifications and stable mitral valve and aortic valve calcifications. Enlarged pulmonary arterial trunk suggesting pulmonary hypertension. Mediastinum/Nodes: Stable scattered borderline enlarged  mediastinal and hilar lymph  nodes likely reactive. No new or progressive findings. The esophagus is grossly. Lungs/Pleura: Underlying emphysematous changes and pulmonary scarring. Streaky areas of subpleural atelectasis. No pulmonary infiltrates or pleural effusions. No pulmonary edema. The central tracheobronchial tree is unremarkable. Upper Abdomen: No significant upper abdominal findings. Vascular calcifications noted. Musculoskeletal: No breast masses, supraclavicular or axillary adenopathy. The bony thorax is intact. IMPRESSION: 1. Underlying emphysematous changes and pulmonary scarring. 2. No acute pulmonary findings or pleural effusions. 3. Stable severe age advanced three-vessel coronary artery calcifications and mitral valve annular and aortic valve calcifications. 4. Enlarged pulmonary arterial trunk suggesting pulmonary hypertension. 5. Stable scattered borderline enlarged mediastinal and hilar lymph nodes, likely reactive. Aortic Atherosclerosis (ICD10-I70.0) and Emphysema (ICD10-J43.9). Electronically Signed   By: Marrian Siva M.D.   On: 05/09/2024 11:14   DG Chest Port 1 View Result Date: 05/09/2024 CLINICAL DATA:  Chest pain EXAM: PORTABLE CHEST 1 VIEW COMPARISON:  Prior CT scan of the chest 08/12/2023 FINDINGS: Left IJ approach tunneled hemodialysis catheter in good position with the tip overlying the mid atrium. Cardiomegaly. Extensive interstitial opacities throughout both lungs with fluid tracking along the minor fissure. Probable small bilateral pleural effusions. Central vascular engorgement. Findings are consistent with pulmonary edema. Atherosclerotic calcifications present in the aorta. No acute osseous abnormality. No pneumothorax. IMPRESSION: Mild to moderate CHF. Well-positioned left IJ tunneled hemodialysis catheter. Probable trace pleural effusions. Electronically Signed   By: Fernando Hoyer M.D.   On: 05/09/2024 08:09     Subjective: No complaints feels great she relates her  chest pain is resolved  Discharge Exam: Vitals:   05/20/24 0808 05/20/24 0829  BP:  111/65  Pulse:  100  Resp:  19  Temp:  98.6 F (37 C)  SpO2: 99% 99%   Vitals:   05/19/24 1956 05/20/24 0425 05/20/24 0808 05/20/24 0829  BP: (!) 131/45 110/72  111/65  Pulse: (!) 103 (!) 107  100  Resp:  18  19  Temp: 98.1 F (36.7 C) 98.7 F (37.1 C)  98.6 F (37 C)  TempSrc: Oral     SpO2: 94% 92% 99% 99%  Weight:      Height:        General: Pt is alert, awake, not in acute distress Cardiovascular: RRR, S1/S2 +, no rubs, no gallops Respiratory: CTA bilaterally, no wheezing, no rhonchi Abdominal: Soft, NT, ND, bowel sounds + Extremities: no edema, no cyanosis    The results of significant diagnostics from this hospitalization (including imaging, microbiology, ancillary and laboratory) are listed below for reference.     Microbiology: No results found for this or any previous visit (from the past 240 hours).   Labs: BNP (last 3 results) Recent Labs    05/09/24 0720 05/12/24 0646  BNP 147.9* 127.2*   Basic Metabolic Panel: Recent Labs  Lab 05/19/24 1019 05/20/24 0616  NA 133* 132*  K 4.0 4.8  CL 96* 94*  CO2 27 24  GLUCOSE 140* 165*  BUN 15 30*  CREATININE 4.59* 7.11*  CALCIUM  7.5* 8.0*  MG 1.7  --   PHOS 2.5  --    Liver Function Tests: Recent Labs  Lab 05/19/24 1019  AST 17  ALT 16  ALKPHOS 89  BILITOT 0.6  PROT 6.5  ALBUMIN  2.9*   No results for input(s): "LIPASE", "AMYLASE" in the last 168 hours. No results for input(s): "AMMONIA" in the last 168 hours. CBC: Recent Labs  Lab 05/19/24 1019 05/20/24 0616  WBC 14.3* 14.7*  NEUTROABS 10.6* 11.3*  HGB 12.5 11.8*  HCT 39.9 37.9  MCV 94.3 94.8  PLT 205 215   Cardiac Enzymes: Recent Labs  Lab 05/19/24 1948  CKTOTAL 32*   BNP: Invalid input(s): "POCBNP" CBG: No results for input(s): "GLUCAP" in the last 168 hours. D-Dimer No results for input(s): "DDIMER" in the last 72 hours. Hgb  A1c No results for input(s): "HGBA1C" in the last 72 hours. Lipid Profile No results for input(s): "CHOL", "HDL", "LDLCALC", "TRIG", "CHOLHDL", "LDLDIRECT" in the last 72 hours. Thyroid  function studies No results for input(s): "TSH", "T4TOTAL", "T3FREE", "THYROIDAB" in the last 72 hours.  Invalid input(s): "FREET3" Anemia work up No results for input(s): "VITAMINB12", "FOLATE", "FERRITIN", "TIBC", "IRON ", "RETICCTPCT" in the last 72 hours. Urinalysis    Component Value Date/Time   COLORURINE YELLOW 03/02/2019 1416   APPEARANCEUR CLEAR 03/02/2019 1416   LABSPEC 1.010 03/02/2019 1416   PHURINE 6.0 03/02/2019 1416   GLUCOSEU 50 (A) 03/02/2019 1416   HGBUR SMALL (A) 03/02/2019 1416   BILIRUBINUR NEGATIVE 03/02/2019 1416   KETONESUR 5 (A) 03/02/2019 1416   PROTEINUR 100 (A) 03/02/2019 1416   UROBILINOGEN 0.2 10/19/2015 1532   NITRITE NEGATIVE 03/02/2019 1416   LEUKOCYTESUR NEGATIVE 03/02/2019 1416   Sepsis Labs Recent Labs  Lab 05/19/24 1019 05/20/24 0616  WBC 14.3* 14.7*   Microbiology No results found for this or any previous visit (from the past 240 hours).   Time coordinating discharge: Over 35 minutes  SIGNED:   Macdonald Savoy, MD  [facility] 05/20/2024, 2:04 PM Pager   If 7PM-7AM, please contact night-coverage [email or web address] Password [email or web address]

## 2024-05-20 NOTE — Progress Notes (Signed)
 SATURATION QUALIFICATIONS: (This note is used to comply with regulatory documentation for home oxygen )  Patient Saturations on Room Air at Rest = 88%  Patient Saturations on Room Air while Ambulating = 84%  Patient Saturations on 2 Liters of oxygen  while Ambulating = 100%  Please briefly explain why patient needs home oxygen : pt requires supplemental O2 to maintain adequate oxygen  saturation level during mobility and at rest.   Corbin Dess PT, DPT Acute Rehabilitation Services Office [phone]  05/20/24 1:29 PM

## 2024-05-20 NOTE — Evaluation (Signed)
 Physical Therapy Evaluation Patient Details Name: Whitney Walker MRN: [id number] DOB: 10-28-1956 Today's Date: 05/20/2024  History of Present Illness  Patient is 68 y.o. female presented to ED with chest pain for ~2-3 weeks PTA and worsened with deep breaths. In ED pt found tachycardic, hypotensive and hypoxic 79% on RA, WBC count of 14,000, tropes of 28, CT chest suggested pulmonary artery hypertension, and some subsegmental atelectasis no other acute findings.PMH significant for HTN, HLD, PAD with a history of Lt fem-pop bypass in 2014, DVT and PE on Eliquis , chronic diastolic dysfunction, ESRD with HD on MWF, OSA, COPD, colon cancer.   Clinical Impression  Whitney Walker is 68 y.o. female admitted with above HPI and diagnosis. Patient is currently limited by functional impairments below (see PT problem list). Patient lives alone and is mod ind with rollator from household and community mobility at baseline. Pt completing bed mob at mod ind level and trasnfers and gait with no AD at CGA/supervision level. Pt noted to desaturate on RA with ambulation to mid 80's and recovered to 100% on 2L/min during ambulation. Encouraged use of oxygen  while mobilizing in hospital. Patient will benefit from continued skilled PT interventions to address impairments and progress independence with mobility. Acute PT will follow and progress as able.         If plan is discharge home, recommend the following: Assist for transportation   Can travel by private vehicle        Equipment Recommendations None recommended by PT  Recommendations for Other Services       Functional Status Assessment Patient has had a recent decline in their functional status and demonstrates the ability to make significant improvements in function in a reasonable and predictable amount of time.     Precautions / Restrictions Precautions Precautions: Fall Recall of Precautions/Restrictions: Impaired Restrictions Weight  Bearing Restrictions Per Provider Order: No      Mobility  Bed Mobility Overal bed mobility: Needs Assistance Bed Mobility: Supine to Sit     Supine to sit: Supervision, Used rails, HOB elevated     General bed mobility comments: sup with some extra time    Transfers Overall transfer level: Needs assistance Equipment used: None Transfers: Sit to/from Stand Sit to Stand: Supervision           General transfer comment: sup for safety, use of hands to rise and lower    Ambulation/Gait Ambulation/Gait assistance: Contact guard assist, Supervision Gait Distance (Feet): 300 Feet Assistive device: None Gait Pattern/deviations: Step-through pattern, Decreased stride length Gait velocity: decr     General Gait Details: no overt LOB noted, pt steady with no device, occasional drift Lt but able to self correct. desat on RA to mid-upper 80's and recovered to 100% with 2L/min.  Stairs            Wheelchair Mobility     Tilt Bed    Modified Rankin (Stroke Patients Only)       Balance Overall balance assessment: Needs assistance Sitting-balance support: Feet supported Sitting balance-Leahy Scale: Good     Standing balance support: During functional activity, No upper extremity supported Standing balance-Leahy Scale: Good                               Pertinent Vitals/Pain      Home Living Family/patient expects to be discharged to:: Private residence Living Arrangements: Alone Available Help at Discharge: Personal care attendant Type  of Home: Apartment Home Access: Stairs to enter Entrance Stairs-Rails: Doctor, general practice of Steps: 1   Home Layout: One level Home Equipment: Agricultural consultant (2 wheels);Rollator (4 wheels);BSC/3in1;Shower seat;Wheelchair - manual (sleeps in recliner) Additional Comments: goes to [facility] for OPPT    Prior Function Prior Level of Function : Independent/Modified Independent (relies on  public/medical transport)             Mobility Comments: sleeps in recliner, uses rollator for long distance or in home, uses scooter in store, has WC and uses for MD appointments ADLs Comments: sits down in tub to bathe due to HD catheter, independent in self care and IADLs     Extremity/Trunk Assessment   Upper Extremity Assessment Upper Extremity Assessment: Overall WFL for tasks assessed    Lower Extremity Assessment Lower Extremity Assessment: Overall WFL for tasks assessed    Cervical / Trunk Assessment Cervical / Trunk Assessment: Normal  Communication   Communication Communication: No apparent difficulties    Cognition Arousal: Alert Behavior During Therapy: WFL for tasks assessed/performed   PT - Cognitive impairments: No apparent impairments                         Following commands: Intact       Cueing Cueing Techniques: Verbal cues     General Comments      Exercises     Assessment/Plan    PT Assessment Patient needs continued PT services  PT Problem List Decreased strength;Decreased mobility;Decreased balance;Decreased activity tolerance;Decreased range of motion;Decreased knowledge of use of DME;Decreased safety awareness;Decreased knowledge of precautions;Cardiopulmonary status limiting activity;Obesity       PT Treatment Interventions DME instruction;Balance training;Therapeutic exercise;Therapeutic activities;Functional mobility training;Stair training;Gait training;Patient/family education    PT Goals (Current goals can be found in the Care Plan section)  Acute Rehab PT Goals Patient Stated Goal: get home and pain to go away PT Goal Formulation: With patient Time For Goal Achievement: 06/03/24 Potential to Achieve Goals: Good    Frequency Min 1X/week     Co-evaluation               AM-PAC PT "6 Clicks" Mobility  Outcome Measure Help needed turning from your back to your side while in a flat bed without using  bedrails?: None Help needed moving from lying on your back to sitting on the side of a flat bed without using bedrails?: None Help needed moving to and from a bed to a chair (including a wheelchair)?: A Little Help needed standing up from a chair using your arms (e.g., wheelchair or bedside chair)?: A Little Help needed to walk in hospital room?: A Little Help needed climbing 3-5 steps with a railing? : A Little 6 Click Score: 20    End of Session Equipment Utilized During Treatment: Gait belt;Oxygen  Activity Tolerance: Patient tolerated treatment well Patient left: in bed;with call bell/phone within reach (seated EOB) Nurse Communication: Mobility status PT Visit Diagnosis: Unsteadiness on feet (R26.81);Pain;Difficulty in walking, not elsewhere classified (R26.2) Pain - part of body:  (chest)    Time: 1610-9604 PT Time Calculation (min) (ACUTE ONLY): 26 min   Charges:   PT Evaluation $PT Eval Low Complexity: 1 Low PT Treatments $Gait Training: 8-22 mins PT General Charges $$ ACUTE PT VISIT: 1 Visit         Tish Forge, DPT Acute Rehabilitation Services Office [phone]  05/20/24 1:42 PM

## 2024-05-21 DIAGNOSIS — D509 Iron deficiency anemia, unspecified: Secondary | ICD-10-CM | POA: Diagnosis not present

## 2024-05-21 DIAGNOSIS — D631 Anemia in chronic kidney disease: Secondary | ICD-10-CM | POA: Diagnosis not present

## 2024-05-21 DIAGNOSIS — E875 Hyperkalemia: Secondary | ICD-10-CM | POA: Diagnosis not present

## 2024-05-21 DIAGNOSIS — N2581 Secondary hyperparathyroidism of renal origin: Secondary | ICD-10-CM | POA: Diagnosis not present

## 2024-05-21 DIAGNOSIS — N186 End stage renal disease: Secondary | ICD-10-CM | POA: Diagnosis not present

## 2024-05-21 DIAGNOSIS — Z992 Dependence on renal dialysis: Secondary | ICD-10-CM | POA: Diagnosis not present

## 2024-05-21 DIAGNOSIS — D689 Coagulation defect, unspecified: Secondary | ICD-10-CM | POA: Diagnosis not present

## 2024-05-24 DIAGNOSIS — D631 Anemia in chronic kidney disease: Secondary | ICD-10-CM | POA: Diagnosis not present

## 2024-05-24 DIAGNOSIS — N2581 Secondary hyperparathyroidism of renal origin: Secondary | ICD-10-CM | POA: Diagnosis not present

## 2024-05-24 DIAGNOSIS — E875 Hyperkalemia: Secondary | ICD-10-CM | POA: Diagnosis not present

## 2024-05-24 DIAGNOSIS — D509 Iron deficiency anemia, unspecified: Secondary | ICD-10-CM | POA: Diagnosis not present

## 2024-05-24 DIAGNOSIS — Z992 Dependence on renal dialysis: Secondary | ICD-10-CM | POA: Diagnosis not present

## 2024-05-24 DIAGNOSIS — N186 End stage renal disease: Secondary | ICD-10-CM | POA: Diagnosis not present

## 2024-05-24 DIAGNOSIS — D689 Coagulation defect, unspecified: Secondary | ICD-10-CM | POA: Diagnosis not present

## 2024-05-25 ENCOUNTER — Encounter: Payer: Self-pay | Admitting: Nurse Practitioner

## 2024-05-25 ENCOUNTER — Telehealth: Admitting: Nurse Practitioner

## 2024-05-25 DIAGNOSIS — R091 Pleurisy: Secondary | ICD-10-CM | POA: Diagnosis not present

## 2024-05-25 DIAGNOSIS — R079 Chest pain, unspecified: Secondary | ICD-10-CM | POA: Diagnosis not present

## 2024-05-25 MED ORDER — CLINDAMYCIN HCL 300 MG PO CAPS: 300.0000 mg | ORAL_CAPSULE | Freq: Three times a day (TID) | ORAL | 0 refills | Status: AC

## 2024-05-25 NOTE — Progress Notes (Addendum)
 Virtual Visit via Video Note  Whitney Walker, CMA,acting as a scribe for Whitney Epley, FNP.,have documented all relevant documentation on the behalf of Whitney Epley, FNP,as directed by  Whitney Epley, FNP while in the presence of Whitney Epley, FNP.  I connected with Whitney Walker on 05/29/24 at 12:00 PM EDT by a video enabled telemedicine application and verified that I am speaking with the correct person using two identifiers.  Patient Location: Home Provider Location: Office/Clinic  I discussed the limitations, risks, security, and privacy concerns of performing an evaluation and management service by video and the availability of in person appointments. I also discussed with the patient that there may be a patient responsible charge related to this service. The patient expressed understanding and agreed to proceed.  Subjective: PCP: Whitney Epley, FNP  Chief Complaint  Patient presents with   Hospitalization Follow-up    Patient presents today for a hospital follow up, patient reports compliance with medications. Patient was admitted 05/19/2024 and discharged on 05/20/2024.  Patient reports today she is feeling the same, she reports she is still having the pain. She reports she has not followed up with her cardiologist- she reports she was told to follow up with PCP. She reports she was told it was not her heart causing the pain. She reports she doesn't have a PE. Patient states she is unsure if the PT has caused her pai   She is doing a virtual visit from going to ER 5/11, 5/14, 5/16 where she was admitted and 5/21. She was kept in observation on 5/21. She has had a full heart work up and checked for pulmonary embolism for her chest pain. She had dialysis there as well. They were initially thinking she had a catheter change. They feel is pleurisy. She was given ibuprofen  and a steroid. She was started on pantoprazole  but is already taking omeprazole , she states "I am not taking the  pantroprazole".   She was taking 800 mg of ibuprofen  prior to going to the ER. She was given low dose for 2 days.   She does have shortness of breath, she was given breathing treatments. She has not needed to use her albuterol . Denies fever.   She has put her PT on hold for her shoulder because she thought her pain may be related to her shoulder exercises.      ROS: Per HPI  Current Outpatient Medications:    acetaminophen  (TYLENOL ) 650 MG CR tablet, Take 1,950 mg by mouth every 8 (eight) hours as needed for pain., Disp: , Rfl:    albuterol  (VENTOLIN  HFA) 108 (90 Base) MCG/ACT inhaler, Inhale 2 puffs into the lungs every 6 (six) hours as needed for wheezing or shortness of breath., Disp: 18 g, Rfl: 2   Biotin  5 MG CAPS, Take 5 mg by mouth daily at 6 (six) AM., Disp: , Rfl:    clindamycin  (CLEOCIN ) 300 MG capsule, Take 1 capsule (300 mg total) by mouth 3 (three) times daily for 10 days., Disp: 30 capsule, Rfl: 0   ELIQUIS  5 MG TABS tablet, TAKE 1 TABLET (5 MG TOTAL) BY MOUTH 2 (TWO) TIMES DAILY., Disp: 60 tablet, Rfl: 2   lidocaine  (LIDODERM ) 5 %, Place 1 patch onto the skin daily for 10 days. Remove & Discard patch within 12 hours or as directed by MD, Disp: 10 patch, Rfl: 0   meclizine  (ANTIVERT ) 12.5 MG tablet, Take 1 tablet (12.5 mg total) by mouth 3 (three) times daily as needed  for dizziness., Disp: 90 tablet, Rfl: 1   midodrine  (PROAMATINE ) 5 MG tablet, Take 5 mg by mouth 2 (two) times daily as needed (hypotension)., Disp: , Rfl:    multivitamin (RENA-VIT) TABS tablet, Take 1 tablet by mouth at bedtime., Disp: , Rfl:    omeprazole  (PRILOSEC) 20 MG capsule, TAKE 1 CAPSULE (20 MG TOTAL) BY MOUTH DAILY., Disp: 90 capsule, Rfl: 1   sevelamer  carbonate (RENVELA ) 800 MG tablet, Take 800 mg by mouth at bedtime., Disp: , Rfl:    sodium zirconium cyclosilicate  (LOKELMA ) 10 g PACK packet, Take 10 g by mouth daily., Disp: , Rfl:    Tiotropium Bromide-Olodaterol (STIOLTO RESPIMAT ) 2.5-2.5 MCG/ACT  AERS, Inhale 2 puffs into the lungs daily., Disp: 1 each, Rfl: 11   vitamin C  (ASCORBIC ACID ) 250 MG tablet, Take 250 mg by mouth daily., Disp: , Rfl:    vitamin E 200 UNIT capsule, Take 200 Units by mouth daily., Disp: , Rfl:    atorvastatin  (LIPITOR) 20 MG tablet, Take 1 tablet (20 mg total) by mouth daily. (Patient taking differently: Take 20 mg by mouth at bedtime.), Disp: 30 tablet, Rfl: 6   pantoprazole  (PROTONIX ) 40 MG tablet, Take 1 tablet (40 mg total) by mouth 2 (two) times daily. (Patient not taking: Reported on 05/25/2024), Disp: 30 tablet, Rfl: 0  Observations/Objective: There were no vitals filed for this visit. Physical Exam Vitals and nursing note reviewed.  Constitutional:      General: She is not in acute distress.    Appearance: Normal appearance. She is well-developed. She is obese.  HENT:     Head: Normocephalic and atraumatic.  Eyes:     Pupils: Pupils are equal, round, and reactive to light.  Cardiovascular:     Rate and Rhythm: Normal rate and regular rhythm.     Pulses: Normal pulses.     Heart sounds: Normal heart sounds. No murmur heard. Pulmonary:     Effort: Pulmonary effort is normal. No respiratory distress.     Breath sounds: Normal breath sounds. No wheezing.  Musculoskeletal:        General: Normal range of motion.  Skin:    General: Skin is warm and dry.     Capillary Refill: Capillary refill takes less than 2 seconds.  Neurological:     General: No focal deficit present.     Mental Status: She is alert and oriented to person, place, and time.     Cranial Nerves: No cranial nerve deficit.  Psychiatric:        Mood and Affect: Mood normal.     Assessment and Plan: Nonspecific chest pain Assessment & Plan: Had a full cardiac workup in ER and thought to be related to muscle vs pleurisy. She reports pain when she presses on the area.    Pleurisy Assessment & Plan: Continues to have the pain to her chest, will treat with antibiotic. If not  better return call to office.   Orders: -     Clindamycin  HCl; Take 1 capsule (300 mg total) by mouth 3 (three) times daily for 10 days.  Dispense: 30 capsule; Refill: 0   Follow Up Instructions: Return for keep same next.   I discussed the assessment and treatment plan with the patient. The patient was provided an opportunity to ask questions, and all were answered. The patient agreed with the plan and demonstrated an understanding of the instructions.   The patient was advised to call back or seek an in-person evaluation if the  symptoms worsen or if the condition fails to improve as anticipated.  The above assessment and management plan was discussed with the patient. The patient verbalized understanding of and has agreed to the management plan.    Inge Mangle, FNP, have reviewed all documentation for this visit. The documentation on 05/25/24 for the exam, diagnosis, procedures, and orders are all accurate and complete.

## 2024-05-26 DIAGNOSIS — D509 Iron deficiency anemia, unspecified: Secondary | ICD-10-CM | POA: Diagnosis not present

## 2024-05-26 DIAGNOSIS — D689 Coagulation defect, unspecified: Secondary | ICD-10-CM | POA: Diagnosis not present

## 2024-05-26 DIAGNOSIS — Z992 Dependence on renal dialysis: Secondary | ICD-10-CM | POA: Diagnosis not present

## 2024-05-26 DIAGNOSIS — D631 Anemia in chronic kidney disease: Secondary | ICD-10-CM | POA: Diagnosis not present

## 2024-05-26 DIAGNOSIS — N2581 Secondary hyperparathyroidism of renal origin: Secondary | ICD-10-CM | POA: Diagnosis not present

## 2024-05-26 DIAGNOSIS — N186 End stage renal disease: Secondary | ICD-10-CM | POA: Diagnosis not present

## 2024-05-26 DIAGNOSIS — E875 Hyperkalemia: Secondary | ICD-10-CM | POA: Diagnosis not present

## 2024-05-28 DIAGNOSIS — Z992 Dependence on renal dialysis: Secondary | ICD-10-CM | POA: Diagnosis not present

## 2024-05-28 DIAGNOSIS — D509 Iron deficiency anemia, unspecified: Secondary | ICD-10-CM | POA: Diagnosis not present

## 2024-05-28 DIAGNOSIS — N2581 Secondary hyperparathyroidism of renal origin: Secondary | ICD-10-CM | POA: Diagnosis not present

## 2024-05-28 DIAGNOSIS — E875 Hyperkalemia: Secondary | ICD-10-CM | POA: Diagnosis not present

## 2024-05-28 DIAGNOSIS — N186 End stage renal disease: Secondary | ICD-10-CM | POA: Diagnosis not present

## 2024-05-28 DIAGNOSIS — D689 Coagulation defect, unspecified: Secondary | ICD-10-CM | POA: Diagnosis not present

## 2024-05-28 DIAGNOSIS — D631 Anemia in chronic kidney disease: Secondary | ICD-10-CM | POA: Diagnosis not present

## 2024-05-29 DIAGNOSIS — I509 Heart failure, unspecified: Secondary | ICD-10-CM | POA: Diagnosis not present

## 2024-05-29 DIAGNOSIS — N186 End stage renal disease: Secondary | ICD-10-CM | POA: Diagnosis not present

## 2024-05-29 DIAGNOSIS — I129 Hypertensive chronic kidney disease with stage 1 through stage 4 chronic kidney disease, or unspecified chronic kidney disease: Secondary | ICD-10-CM | POA: Diagnosis not present

## 2024-05-29 DIAGNOSIS — G4733 Obstructive sleep apnea (adult) (pediatric): Secondary | ICD-10-CM | POA: Diagnosis not present

## 2024-05-29 DIAGNOSIS — Z992 Dependence on renal dialysis: Secondary | ICD-10-CM | POA: Diagnosis not present

## 2024-05-29 DIAGNOSIS — R531 Weakness: Secondary | ICD-10-CM | POA: Diagnosis not present

## 2024-05-29 NOTE — Assessment & Plan Note (Signed)
 Continues to have the pain to her chest, will treat with antibiotic. If not better return call to office.

## 2024-05-29 NOTE — Assessment & Plan Note (Signed)
 Had a full cardiac workup in ER and thought to be related to muscle vs pleurisy. She reports pain when she presses on the area.

## 2024-05-31 ENCOUNTER — Ambulatory Visit: Payer: Self-pay

## 2024-05-31 DIAGNOSIS — E875 Hyperkalemia: Secondary | ICD-10-CM | POA: Diagnosis not present

## 2024-05-31 DIAGNOSIS — N186 End stage renal disease: Secondary | ICD-10-CM | POA: Diagnosis not present

## 2024-05-31 DIAGNOSIS — D689 Coagulation defect, unspecified: Secondary | ICD-10-CM | POA: Diagnosis not present

## 2024-05-31 DIAGNOSIS — N2581 Secondary hyperparathyroidism of renal origin: Secondary | ICD-10-CM | POA: Diagnosis not present

## 2024-05-31 DIAGNOSIS — D509 Iron deficiency anemia, unspecified: Secondary | ICD-10-CM | POA: Diagnosis not present

## 2024-05-31 DIAGNOSIS — Z992 Dependence on renal dialysis: Secondary | ICD-10-CM | POA: Diagnosis not present

## 2024-05-31 NOTE — Telephone Encounter (Signed)
 Copied from CRM [id number]. Topic: Clinical - Medical Advice >> May 31, 2024 11:05 AM Whitney Walker T wrote: Reason for CRM: patient called stated she has been having itching and diarrhea since Saturday when she started taking clindamycin  (CLEOCIN ) 300 MG capsule. Please f/u with patient

## 2024-05-31 NOTE — Telephone Encounter (Signed)
 Copied from CRM [id number]. Topic: Clinical - Medical Advice >> May 31, 2024 11:05 AM Whitney Walker wrote: Reason for CRM: patient called stated she has been having itching and diarrhea since Saturday when she started taking clindamycin  (CLEOCIN ) 300 MG capsule. Please f/u with patient   Chief Complaint: reaction to antibiotic Symptoms: diarrhea  Frequency: constant Pertinent Negatives: Patient denies fever Disposition: [] ED /[] Urgent Care (no appt availability in office) / [] Appointment(In office/virtual)/ []  [place] [facility]/ [] Home Care/ [] Refused Recommended Disposition /[] [facility]/ []  Follow-up with PCP Additional Notes: started medication on 5/28, has been experience belly pain, diarrhea, and itching all over. Abd pain better when eating but diarrhea is persisting. Pt says that she took some benadryl  but it helped for a little while. Spoke with Shavonne at [facility] was advised that [place], FNP is advising patient to stop taking the medication and start taking claritin  for the itching. Will hold off on abx for now. Will f/u with patient on 6/17    Reason for Disposition  [1] Caller has URGENT medicine question about med that PCP or specialist prescribed AND [2] triager unable to answer question  Answer Assessment - Initial Assessment Questions 1. NAME of MEDICINE: "What medicine(s) are you calling about?"     Clindamycin   2. QUESTION: "What is your question?" (e.g., double dose of medicine, side effect)     Concern about side effects  3. PRESCRIBER: "Who prescribed the medicine?" Reason: if prescribed by specialist, call should be referred to that group.     Susanna Epley  4. SYMPTOMS: "Do you have any symptoms?" If Yes, ask: "What symptoms are you having?"  "How bad are the symptoms (e.g., mild, moderate, severe)     Itching everywhere and diarrhea and some abd pain  5. PREGNANCY:  "Is there any chance that you are pregnant?" "When was your last menstrual period?"      no  Protocols used: Medication Question Call-A-AH

## 2024-06-02 ENCOUNTER — Ambulatory Visit: Payer: Self-pay | Admitting: Nurse Practitioner

## 2024-06-02 DIAGNOSIS — N186 End stage renal disease: Secondary | ICD-10-CM | POA: Diagnosis not present

## 2024-06-02 DIAGNOSIS — D509 Iron deficiency anemia, unspecified: Secondary | ICD-10-CM | POA: Diagnosis not present

## 2024-06-02 DIAGNOSIS — N2581 Secondary hyperparathyroidism of renal origin: Secondary | ICD-10-CM | POA: Diagnosis not present

## 2024-06-02 DIAGNOSIS — E875 Hyperkalemia: Secondary | ICD-10-CM | POA: Diagnosis not present

## 2024-06-02 DIAGNOSIS — D689 Coagulation defect, unspecified: Secondary | ICD-10-CM | POA: Diagnosis not present

## 2024-06-02 DIAGNOSIS — Z992 Dependence on renal dialysis: Secondary | ICD-10-CM | POA: Diagnosis not present

## 2024-06-04 DIAGNOSIS — D689 Coagulation defect, unspecified: Secondary | ICD-10-CM | POA: Diagnosis not present

## 2024-06-04 DIAGNOSIS — E875 Hyperkalemia: Secondary | ICD-10-CM | POA: Diagnosis not present

## 2024-06-04 DIAGNOSIS — Z992 Dependence on renal dialysis: Secondary | ICD-10-CM | POA: Diagnosis not present

## 2024-06-04 DIAGNOSIS — N186 End stage renal disease: Secondary | ICD-10-CM | POA: Diagnosis not present

## 2024-06-04 DIAGNOSIS — D509 Iron deficiency anemia, unspecified: Secondary | ICD-10-CM | POA: Diagnosis not present

## 2024-06-04 DIAGNOSIS — N2581 Secondary hyperparathyroidism of renal origin: Secondary | ICD-10-CM | POA: Diagnosis not present

## 2024-06-07 DIAGNOSIS — D689 Coagulation defect, unspecified: Secondary | ICD-10-CM | POA: Diagnosis not present

## 2024-06-07 DIAGNOSIS — N186 End stage renal disease: Secondary | ICD-10-CM | POA: Diagnosis not present

## 2024-06-07 DIAGNOSIS — N2581 Secondary hyperparathyroidism of renal origin: Secondary | ICD-10-CM | POA: Diagnosis not present

## 2024-06-07 DIAGNOSIS — D509 Iron deficiency anemia, unspecified: Secondary | ICD-10-CM | POA: Diagnosis not present

## 2024-06-07 DIAGNOSIS — E875 Hyperkalemia: Secondary | ICD-10-CM | POA: Diagnosis not present

## 2024-06-07 DIAGNOSIS — Z992 Dependence on renal dialysis: Secondary | ICD-10-CM | POA: Diagnosis not present

## 2024-06-08 ENCOUNTER — Other Ambulatory Visit: Payer: Self-pay

## 2024-06-08 ENCOUNTER — Telehealth: Payer: Self-pay | Admitting: Hematology

## 2024-06-08 NOTE — Patient Outreach (Signed)
 Complex Care Management   Visit Note  06/08/2024  Name:  Whitney Walker MRN: [id number] DOB: 1956-01-06  Situation: Referral received for Complex Care Management related to  Heart Failure, COPD, ESRD, and Malignant Hypertension with heart failure and End Stage Renal disease, Pleurisy, Chest Pain unspecified, Moderate right ankle sprain, closed nondisplaced fracture of proximal end of right humerus.  I obtained verbal consent from Patient.  Visit completed with patient on the phone.  Background:   Past Medical History:  Diagnosis Date   Acute left-sided low back pain without sciatica 02/22/2024   Anemia    Anxiety    CHF (congestive heart failure) (HCC)    Colon cancer (HCC)    treatment surgery   Complication of anesthesia    after first [facility]- [place] "couldnt walk after", patient denies having a spinal   COPD (chronic obstructive pulmonary disease) (HCC)    Coronary artery disease    Depression    DVT (deep venous thrombosis) (HCC)    Encounter for annual health examination 08/05/2023   ESRD on hemodialysis (HCC)    Hemo: MWF   History of blood transfusion 04/2018   Hypertension    07/07/18- no longer takes BP medications   Left hip pain 05/16/2022   Left leg pain 05/09/2020   Leucocytosis 04/28/2012   Meningitis    Orthostatic hypotension 05/16/2022   Pain in limb 07/30/2013   PE (pulmonary embolism)    Peripheral vascular disease (HCC)    Primary hypertension 11/15/2013   Rash 11/25/2023   Restless legs    Shortness of breath    with exertion   Sleep apnea    SOB (shortness of breath) 03/03/2019   Vertigo     Assessment: Patient Reported Symptoms:  Cognitive Cognitive Status: Alert and oriented to person, place, and time Cognitive/Intellectual Conditions Management [RPT]: None reported or documented in medical history or problem list   Health Maintenance Behaviors: Annual physical exam Health Facilitated by: Pain control  Neurological Neurological Review of  Symptoms: No symptoms reported    HEENT HEENT Symptoms Reported: No symptoms reported      Cardiovascular Cardiovascular Symptoms Reported: Chest pain or discomfort Does patient have uncontrolled Hypertension?: No Cardiovascular Conditions: Coronary artery disease, Heart failure, Chest pain (Hypertensive heart and renal disease; Aortic Atherosclerosis;) Cardiovascular Management Strategies: Adequate rest, Medication therapy, Routine screening, Diet modification, Fluid modification Cardiovascular Self-Management Outcome: 3 (uncertain)  Respiratory Respiratory Symptoms Reported: Other: Other Respiratory Symptoms: Pleurisy Respiratory Conditions: Sleep disordered breathing Respiratory Self-Management Outcome: 3 (uncertain)  Endocrine Patient reports the following symptoms related to hypoglycemia or hyperglycemia : No symptoms reported Is patient diabetic?: No    Gastrointestinal Gastrointestinal Symptoms Reported: Obesity, Diarrhea Additional Gastrointestinal Details: patient developed diarrhea after starting Clindamycin , symptoms resolved after stopping the medication Gastrointestinal Conditions: Reflux/heartburn Gastrointestinal Management Strategies: Medication therapy, Diet modification Gastrointestinal Self-Management Outcome: 3 (uncertain) Nutrition Risk Screen (CP): No indicators present  Genitourinary Genitourinary Symptoms Reported: No symptoms reported Genitourinary Conditions: End-stage renal disease Genitourinary Management Strategies: Hemodialysis Hemodialysis Schedule: M, W, F Hemodialysis Last Treatment: 06/07/24 Genitourinary Self-Management Outcome: 4 (good)  Integumentary Integumentary Symptoms Reported: Skin changes Skin Conditions: Itching Skin Management Strategies: Routine screening Skin Self-Management Outcome: 3 (uncertain) Skin Comment: patient developed itching after taking Clindamycin , symptmoms improved after stopping the medication  Musculoskeletal  Musculoskelatal Symptoms Reviewed: Muscle pain, Unsteady gait, Difficulty walking Additional Musculoskeletal Details: Moderate right ankle sprain; closed nondisplaced fracture of proximal end of right humerus Musculoskeletal Conditions: Unsteady gait, Mobility limited, Joint pain, fracture Musculoskeletal  Management Strategies: Medical device, Routine screening, Exercise Musculoskeletal Self-Management Outcome: 3 (uncertain) Falls in the past year?: Yes Number of falls in past year: 1 or less Was there an injury with Fall?: Yes Fall Risk Category Calculator: 2 Patient Fall Risk Level: Moderate Fall Risk Patient at Risk for Falls Due to: History of fall(s), Impaired balance/gait, Impaired mobility, Orthopedic patient Fall risk Follow up: Education provided, Falls evaluation completed  Psychosocial Psychosocial Symptoms Reported: Not assessed     Quality of Family Relationships: supportive, involved, helpful      05/06/2024    2:44 PM  Depression screen PHQ 2/9  Decreased Interest 1  Down, Depressed, Hopeless 1  PHQ - 2 Score 2    There were no vitals filed for this visit.  Medications Reviewed Today     Reviewed by Kaylene Pascal, RN (Registered Nurse) on 06/08/24 at 1638  Med List Status: <None>   Medication Order Taking? Sig Documenting Provider Last Dose Status Informant  acetaminophen  (TYLENOL ) 650 MG CR tablet [id number]  Take 1,950 mg by mouth every 8 (eight) hours as needed for pain. [provider]  Active Self, Pharmacy Records  albuterol  (VENTOLIN  HFA) 108 (90 Base) MCG/ACT inhaler [id number]  Inhale 2 puffs into the lungs every 6 (six) hours as needed for wheezing or shortness of breath. Susanna Epley, FNP  Active Self, Pharmacy Records  atorvastatin  (LIPITOR) 20 MG tablet [id number]  Take 1 tablet (20 mg total) by mouth daily.  Patient taking differently: Take 20 mg by mouth at bedtime.   Knox Perl, MD  Expired 05/19/24 2359 Self, Pharmacy Records  Biotin  5 MG  CAPS [id number]  Take 5 mg by mouth daily at 6 (six) AM. [provider]  Active Self, Pharmacy Records  doxycycline  (ADOXA) 100 MG tablet [id number] Yes Take 100 mg by mouth 2 (two) times daily. Prescribed by Dr. Cindra Cree, nephrologist [provider] Taking Active   ELIQUIS  5 MG TABS tablet [id number]  TAKE 1 TABLET (5 MG TOTAL) BY MOUTH 2 (TWO) TIMES DAILY. Susanna Epley, FNP  Active Self, Pharmacy Records  meclizine  (ANTIVERT ) 12.5 MG tablet [id number]  Take 1 tablet (12.5 mg total) by mouth 3 (three) times daily as needed for dizziness. Susanna Epley, FNP  Active Self, Pharmacy Records           Med Note Nanine Babcock, [place] D   Wed May 12, 2024  8:38 AM) Has at home - hasn't used in a while.  midodrine  (PROAMATINE ) 5 MG tablet [id number]  Take 5 mg by mouth 2 (two) times daily as needed (hypotension). [provider]  Active Self, Pharmacy Records  multivitamin (RENA-VIT) TABS tablet [id number]  Take 1 tablet by mouth at bedtime. [provider]  Active Self, Pharmacy Records  omeprazole  (PRILOSEC) 20 MG capsule [id number]  TAKE 1 CAPSULE (20 MG TOTAL) BY MOUTH DAILY. Susanna Epley, FNP  Active Self, Pharmacy Records  pantoprazole  (PROTONIX ) 40 MG tablet [id number]  Take 1 tablet (40 mg total) by mouth 2 (two) times daily.  Patient not taking: Reported on 05/25/2024   Macdonald Savoy, MD  Active   sevelamer  carbonate (RENVELA ) 800 MG tablet [id number]  Take 800 mg by mouth at bedtime. [provider]  Active Self, Pharmacy Records           Med Note Nanine Babcock, [place] D   Wed May 12, 2024  8:35 AM)    sodium zirconium cyclosilicate  (LOKELMA ) 10 g PACK packet [id number]  Take 10 g by mouth daily.  [provider]  Active Self, Pharmacy Records  Tiotropium Bromide-Olodaterol (STIOLTO RESPIMAT ) 2.5-2.5 MCG/ACT AERS [id number]  Inhale 2 puffs into the lungs daily. Hunsucker, Archer Kobs, MD  Active Self, Pharmacy Records  vitamin C  (ASCORBIC ACID ) 250  MG tablet [id number]  Take 250 mg by mouth daily. [provider]  Active Self, Pharmacy Records  vitamin E 200 UNIT capsule [id number]  Take 200 Units by mouth daily. [provider]  Active Self, Pharmacy Records  Med List Note Alline Ivans, CPhT 05/09/24 1149): Dialysis MWF            Recommendation:   PCP Follow-up on 06/15/24 at 11:40 AM Call your Cardiologist, Dr. Berry Bristol to discuss new Echo findings Reschedule your visit with Dr. Maryalice Smaller as discussed   Follow Up Plan:   Telephone follow up appointment date/time:  Tuesday, July 15 at 12:00 PM  Louanne Roussel RN BSN CCM   [facility], [facility] Health Nurse Care Coordinator  Direct Dial : [phone] Website: [email or web address]

## 2024-06-09 DIAGNOSIS — Z992 Dependence on renal dialysis: Secondary | ICD-10-CM | POA: Diagnosis not present

## 2024-06-09 DIAGNOSIS — D509 Iron deficiency anemia, unspecified: Secondary | ICD-10-CM | POA: Diagnosis not present

## 2024-06-09 DIAGNOSIS — E875 Hyperkalemia: Secondary | ICD-10-CM | POA: Diagnosis not present

## 2024-06-09 DIAGNOSIS — D689 Coagulation defect, unspecified: Secondary | ICD-10-CM | POA: Diagnosis not present

## 2024-06-09 DIAGNOSIS — N2581 Secondary hyperparathyroidism of renal origin: Secondary | ICD-10-CM | POA: Diagnosis not present

## 2024-06-09 DIAGNOSIS — N186 End stage renal disease: Secondary | ICD-10-CM | POA: Diagnosis not present

## 2024-06-09 NOTE — Patient Instructions (Signed)
 Visit Information  Thank you for taking time to visit with me today. Please don't hesitate to contact me if I can be of assistance to you before our next scheduled appointment.  Your next care management appointment is by telephone on 07/13/24 at 12 PM  Please call the care guide team at [phone] if you need to cancel, schedule, or reschedule an appointment.   Please call 1-800-273-TALK (toll free, 24 hour hotline) if you are experiencing a Mental Health or Behavioral Health Crisis or need someone to talk to.  Louanne Roussel RN BSN CCM [place]  [facility], [facility] Health Nurse Care Coordinator  Direct Dial : [phone] Website: [email or web address]

## 2024-06-11 DIAGNOSIS — Z992 Dependence on renal dialysis: Secondary | ICD-10-CM | POA: Diagnosis not present

## 2024-06-11 DIAGNOSIS — D689 Coagulation defect, unspecified: Secondary | ICD-10-CM | POA: Diagnosis not present

## 2024-06-11 DIAGNOSIS — E875 Hyperkalemia: Secondary | ICD-10-CM | POA: Diagnosis not present

## 2024-06-11 DIAGNOSIS — N186 End stage renal disease: Secondary | ICD-10-CM | POA: Diagnosis not present

## 2024-06-11 DIAGNOSIS — N2581 Secondary hyperparathyroidism of renal origin: Secondary | ICD-10-CM | POA: Diagnosis not present

## 2024-06-11 DIAGNOSIS — D509 Iron deficiency anemia, unspecified: Secondary | ICD-10-CM | POA: Diagnosis not present

## 2024-06-14 DIAGNOSIS — N186 End stage renal disease: Secondary | ICD-10-CM | POA: Diagnosis not present

## 2024-06-14 DIAGNOSIS — Z992 Dependence on renal dialysis: Secondary | ICD-10-CM | POA: Diagnosis not present

## 2024-06-14 DIAGNOSIS — D689 Coagulation defect, unspecified: Secondary | ICD-10-CM | POA: Diagnosis not present

## 2024-06-14 DIAGNOSIS — N2581 Secondary hyperparathyroidism of renal origin: Secondary | ICD-10-CM | POA: Diagnosis not present

## 2024-06-14 DIAGNOSIS — E875 Hyperkalemia: Secondary | ICD-10-CM | POA: Diagnosis not present

## 2024-06-14 DIAGNOSIS — D509 Iron deficiency anemia, unspecified: Secondary | ICD-10-CM | POA: Diagnosis not present

## 2024-06-14 NOTE — Progress Notes (Addendum)
 LILLETTE Kristeen JINNY Gladis, CMA,acting as a Neurosurgeon for [name], FNP.,have documented all relevant documentation on the behalf of [name], FNP,as directed by  [name], FNP while in the presence of [name], FNP.  Subjective:  Patient ID: Whitney Walker , female    DOB: Nov 20, 1956 , 68 y.o.   MRN: [id number]  Chief Complaint  Patient presents with   Hypertension    Patient presents today for a bp follow up, Patient reports compliance with medication. Patient denies any chest pain, SOB, or headaches.    Shoulder Pain    Patient reports she would like to get a xray of her right shoulder (she broke it in a fall on January 30th.) She wants to see if it is healed, she doesn't have full range of motion of her right shoulder, excruciating pain during dialysis. She is also having pain in her left shoulder.      HPI  She seen Dr. Eudora for her shoulder and continues to have pain to her shoulder. She has not been able to do PT due to the pain in her chest. She still has the pain but not as bad as it was. When she coughs and sneezes she has sharp pain. One hour after dialysis she has the excrutiating pain to right shoulder and has to come off dialysis. She is now having pain to her left arm.   She is able to lift her right arm. She has a vascath to her left chest.     Past Medical History:  Diagnosis Date   Acute left-sided low back pain without sciatica 02/22/2024   Anemia    Anxiety    CHF (congestive heart failure) (HCC)    Colon cancer (HCC)    treatment surgery   Complication of anesthesia    after first C- [place] couldnt walk after, patient denies having a spinal   COPD (chronic obstructive pulmonary disease) (HCC)    Coronary artery disease    Depression    DVT (deep venous thrombosis) (HCC)    Encounter for annual health examination 08/05/2023   ESRD on hemodialysis (HCC)    Hemo: MWF   History of blood transfusion 04/2018   Hypertension    07/07/18- no longer  takes BP medications   Left hip pain 05/16/2022   Left leg pain 05/09/2020   Leucocytosis 04/28/2012   Meningitis    Orthostatic hypotension 05/16/2022   Pain in limb 07/30/2013   PE (pulmonary embolism)    Peripheral vascular disease (HCC)    Primary hypertension 11/15/2013   Rash 11/25/2023   Restless legs    Shortness of breath    with exertion   Sleep apnea    SOB (shortness of breath) 03/03/2019   Vertigo      Family History  Problem Relation Age of Onset   Cancer Mother 98       breast and bone   Cancer Father 51       prostate   Hypertension Sister    Bleeding Disorder Sister    Hypertension Daughter    Cancer Cousin 20       breast cancer    Congestive Heart Failure Son    Breast cancer Neg Hx      Current Outpatient Medications:    acetaminophen  (TYLENOL ) 650 MG CR tablet, Take 1,950 mg by mouth every 8 (eight) hours as needed for pain., Disp: , Rfl:    albuterol  (VENTOLIN  HFA) 108 (90 Base) MCG/ACT inhaler, Inhale 2  puffs into the lungs every 6 (six) hours as needed for wheezing or shortness of breath., Disp: 18 g, Rfl: 2   atorvastatin  (LIPITOR) 20 MG tablet, Take 1 tablet (20 mg total) by mouth daily. (Patient taking differently: Take 20 mg by mouth at bedtime.), Disp: 30 tablet, Rfl: 6   Biotin  5 MG CAPS, Take 5 mg by mouth daily at 6 (six) AM., Disp: , Rfl:    doxycycline  (ADOXA) 100 MG tablet, Take 100 mg by mouth 2 (two) times daily. Prescribed by Dr. Macel, nephrologist, Disp: , Rfl:    ELIQUIS  5 MG TABS tablet, TAKE 1 TABLET (5 MG TOTAL) BY MOUTH 2 (TWO) TIMES DAILY., Disp: 60 tablet, Rfl: 2   meclizine  (ANTIVERT ) 12.5 MG tablet, Take 1 tablet (12.5 mg total) by mouth 3 (three) times daily as needed for dizziness., Disp: 90 tablet, Rfl: 1   midodrine  (PROAMATINE ) 5 MG tablet, Take 5 mg by mouth 2 (two) times daily as needed (hypotension)., Disp: , Rfl:    multivitamin (RENA-VIT) TABS tablet, Take 1 tablet by mouth at bedtime., Disp: , Rfl:     omeprazole  (PRILOSEC) 20 MG capsule, TAKE 1 CAPSULE (20 MG TOTAL) BY MOUTH DAILY., Disp: 90 capsule, Rfl: 1   sevelamer  carbonate (RENVELA ) 800 MG tablet, Take 800 mg by mouth at bedtime., Disp: , Rfl:    sodium zirconium cyclosilicate  (LOKELMA ) 10 g PACK packet, Take 10 g by mouth daily., Disp: , Rfl:    Tiotropium Bromide-Olodaterol (STIOLTO RESPIMAT ) 2.5-2.5 MCG/ACT AERS, Inhale 2 puffs into the lungs daily., Disp: 1 each, Rfl: 11   vitamin C  (ASCORBIC ACID ) 250 MG tablet, Take 250 mg by mouth daily., Disp: , Rfl:    vitamin E 200 UNIT capsule, Take 200 Units by mouth daily., Disp: , Rfl:    Allergies  Allergen Reactions   Difelikefalin Itching   Clindamycin /Lincomycin Diarrhea and Itching   Gadolinium Derivatives Hives and Other (See Comments)    HIVES, Desc: HIVES W/ DYE USED FOR 1ST CT SCAN BUT NOT 2ND, NO PREMEDS USED, PT UNCERTAIN OF CIRCUMSTANCES,,?POSSIBLE MRI CONTRAST ALLERGY, ALL STUDIES DONE SOMEWHERE IN [place] //A.C., Onset Date: [id number]   Naltrexone Other (See Comments)    Unknown 1974      Review of Systems   Today's Vitals   06/15/24 1129  BP: 130/80  Pulse: 92  Temp: 98.7 F (37.1 C)  TempSrc: Oral  Weight: 201 lb 9.6 oz (91.4 kg)  Height: 5' 1 (1.549 m)  PainSc: 7   PainLoc: Shoulder   Body mass index is 38.09 kg/m.  Wt Readings from Last 3 Encounters:  06/22/24 204 lb 1.6 oz (92.6 kg)  06/15/24 201 lb 9.6 oz (91.4 kg)  05/19/24 200 lb (90.7 kg)    The ASCVD Risk score (Arnett DK, et al., 2019) failed to calculate for the following reasons:   Risk score cannot be calculated because patient has a medical history suggesting prior/existing ASCVD  Objective:  Physical Exam Vitals and nursing note reviewed.  Constitutional:      General: She is not in acute distress.    Appearance: Normal appearance. She is obese.   Cardiovascular:     Rate and Rhythm: Normal rate and regular rhythm.     Pulses: Normal pulses.     Heart sounds: Normal  heart sounds. No murmur heard. Pulmonary:     Effort: Pulmonary effort is normal. No respiratory distress.     Breath sounds: Normal breath sounds. No wheezing.   Musculoskeletal:  General: No swelling or deformity. Normal range of motion.     Right shoulder: Tenderness present. No swelling. Normal range of motion.     Left shoulder: Normal.   Skin:    General: Skin is warm and dry.     Capillary Refill: Capillary refill takes less than 2 seconds.     Comments: Vas cath present   Neurological:     General: No focal deficit present.     Mental Status: She is alert and oriented to person, place, and time.     Cranial Nerves: No cranial nerve deficit.     Motor: No weakness.   Psychiatric:        Mood and Affect: Mood normal.        Behavior: Behavior normal.        Thought Content: Thought content normal.        Judgment: Judgment normal.         Assessment And Plan:  Malignant hypertension with heart failure and end-stage renal dis [facility]) Assessment & Plan: Blood pressure is fairly controlled.  Continue current medications and follow-up with cardiology.   Chronic diastolic heart failure (HCC) Assessment & Plan: Stable, continue f/u with Cardiology   PAD (peripheral artery disease) (HCC) Assessment & Plan: Continue statin, tolerating well.  No complaints of numbness or tingling to her feet bilaterally   OSA (obstructive sleep apnea)  Chronic right shoulder pain Assessment & Plan: She is able to lift her right shoulder now. Worsens with dialysis, I have advised her to contact her orthopedic to see next options since she is not getting PT right now. No significant abnormal findings on physical exam   Abnormal glucose Assessment & Plan: Stable, will check A1c. Continue focusing on healthy diet.   Orders: -     Hemoglobin A1c  Morbid (severe) obesity due to excess calories [facility]) Assessment & Plan: She is encouraged to strive for BMI less than 30 to  decrease cardiac risk. Advised to aim for at least 150 minutes of exercise per week.    BMI 38.0-38.9,adult    Return for 6 month bp check.  Patient was given opportunity to ask questions. Patient verbalized understanding of the plan and was able to repeat key elements of the plan. All questions were answered to their satisfaction.    LILLETTE [name], FNP, have reviewed all documentation for this visit. The documentation on 06/15/24 for the exam, diagnosis, procedures, and orders are all accurate and complete.   IF YOU HAVE BEEN REFERRED TO A SPECIALIST, IT MAY TAKE 1-2 WEEKS TO SCHEDULE/PROCESS THE REFERRAL. IF YOU HAVE NOT HEARD FROM US /SPECIALIST IN TWO WEEKS, PLEASE GIVE US  A CALL AT [phone] X [phone].

## 2024-06-15 ENCOUNTER — Encounter: Payer: Self-pay | Admitting: Nurse Practitioner

## 2024-06-15 ENCOUNTER — Ambulatory Visit: Payer: 59 | Admitting: Nurse Practitioner

## 2024-06-15 VITALS — BP 130/80 | HR 92 | Temp 98.7°F | Ht 61.0 in | Wt 201.6 lb

## 2024-06-15 DIAGNOSIS — R7309 Other abnormal glucose: Secondary | ICD-10-CM

## 2024-06-15 DIAGNOSIS — G4733 Obstructive sleep apnea (adult) (pediatric): Secondary | ICD-10-CM

## 2024-06-15 DIAGNOSIS — G8929 Other chronic pain: Secondary | ICD-10-CM

## 2024-06-15 DIAGNOSIS — I5032 Chronic diastolic (congestive) heart failure: Secondary | ICD-10-CM | POA: Diagnosis not present

## 2024-06-15 DIAGNOSIS — E66812 Obesity, class 2: Secondary | ICD-10-CM | POA: Insufficient documentation

## 2024-06-15 DIAGNOSIS — I739 Peripheral vascular disease, unspecified: Secondary | ICD-10-CM | POA: Diagnosis not present

## 2024-06-15 DIAGNOSIS — N186 End stage renal disease: Secondary | ICD-10-CM | POA: Diagnosis not present

## 2024-06-15 DIAGNOSIS — Z992 Dependence on renal dialysis: Secondary | ICD-10-CM | POA: Diagnosis not present

## 2024-06-15 DIAGNOSIS — I132 Hypertensive heart and chronic kidney disease with heart failure and with stage 5 chronic kidney disease, or end stage renal disease: Secondary | ICD-10-CM

## 2024-06-15 DIAGNOSIS — Z6838 Body mass index (BMI) 38.0-38.9, adult: Secondary | ICD-10-CM

## 2024-06-15 DIAGNOSIS — E6609 Other obesity due to excess calories: Secondary | ICD-10-CM

## 2024-06-15 DIAGNOSIS — M25511 Pain in right shoulder: Secondary | ICD-10-CM | POA: Diagnosis not present

## 2024-06-15 NOTE — Patient Instructions (Signed)
 Call Dr. Maryjane Snider, Lovette Rud, MD to see next steps since you can not finish PT and you are still having lots of pain.  [address]  [place], [place] [address]  [phone]

## 2024-06-16 ENCOUNTER — Ambulatory Visit: Payer: Self-pay | Admitting: Nurse Practitioner

## 2024-06-16 DIAGNOSIS — N186 End stage renal disease: Secondary | ICD-10-CM | POA: Diagnosis not present

## 2024-06-16 DIAGNOSIS — N2581 Secondary hyperparathyroidism of renal origin: Secondary | ICD-10-CM | POA: Diagnosis not present

## 2024-06-16 DIAGNOSIS — D509 Iron deficiency anemia, unspecified: Secondary | ICD-10-CM | POA: Diagnosis not present

## 2024-06-16 DIAGNOSIS — Z992 Dependence on renal dialysis: Secondary | ICD-10-CM | POA: Diagnosis not present

## 2024-06-16 DIAGNOSIS — E875 Hyperkalemia: Secondary | ICD-10-CM | POA: Diagnosis not present

## 2024-06-16 DIAGNOSIS — D689 Coagulation defect, unspecified: Secondary | ICD-10-CM | POA: Diagnosis not present

## 2024-06-16 LAB — HEMOGLOBIN A1C
Est. average glucose Bld gHb Est-mCnc: 126 mg/dL
Hgb A1c MFr Bld: 6 % — ABNORMAL HIGH (ref 4.8–5.6)

## 2024-06-17 ENCOUNTER — Ambulatory Visit
Admission: RE | Admit: 2024-06-17 | Discharge: 2024-06-17 | Disposition: A | Discharge status: Home / Self Care | Source: Ambulatory Visit | Attending: Nurse Practitioner | Admitting: Nurse Practitioner

## 2024-06-17 DIAGNOSIS — Z1231 Encounter for screening mammogram for malignant neoplasm of breast: Secondary | ICD-10-CM | POA: Diagnosis not present

## 2024-06-17 DIAGNOSIS — Z0000 Encounter for general adult medical examination without abnormal findings: Secondary | ICD-10-CM

## 2024-06-18 DIAGNOSIS — D689 Coagulation defect, unspecified: Secondary | ICD-10-CM | POA: Diagnosis not present

## 2024-06-18 DIAGNOSIS — Z992 Dependence on renal dialysis: Secondary | ICD-10-CM | POA: Diagnosis not present

## 2024-06-18 DIAGNOSIS — D509 Iron deficiency anemia, unspecified: Secondary | ICD-10-CM | POA: Diagnosis not present

## 2024-06-18 DIAGNOSIS — E875 Hyperkalemia: Secondary | ICD-10-CM | POA: Diagnosis not present

## 2024-06-18 DIAGNOSIS — N186 End stage renal disease: Secondary | ICD-10-CM | POA: Diagnosis not present

## 2024-06-18 DIAGNOSIS — N2581 Secondary hyperparathyroidism of renal origin: Secondary | ICD-10-CM | POA: Diagnosis not present

## 2024-06-21 ENCOUNTER — Other Ambulatory Visit: Payer: Self-pay

## 2024-06-21 DIAGNOSIS — N2581 Secondary hyperparathyroidism of renal origin: Secondary | ICD-10-CM | POA: Diagnosis not present

## 2024-06-21 DIAGNOSIS — E875 Hyperkalemia: Secondary | ICD-10-CM | POA: Diagnosis not present

## 2024-06-21 DIAGNOSIS — D509 Iron deficiency anemia, unspecified: Secondary | ICD-10-CM | POA: Diagnosis not present

## 2024-06-21 DIAGNOSIS — N186 End stage renal disease: Secondary | ICD-10-CM | POA: Diagnosis not present

## 2024-06-21 DIAGNOSIS — N6320 Unspecified lump in the left breast, unspecified quadrant: Secondary | ICD-10-CM

## 2024-06-21 DIAGNOSIS — D689 Coagulation defect, unspecified: Secondary | ICD-10-CM | POA: Diagnosis not present

## 2024-06-21 DIAGNOSIS — Z992 Dependence on renal dialysis: Secondary | ICD-10-CM | POA: Diagnosis not present

## 2024-06-22 ENCOUNTER — Telehealth: Payer: Self-pay

## 2024-06-22 ENCOUNTER — Inpatient Hospital Stay (HOSPITAL_BASED_OUTPATIENT_CLINIC_OR_DEPARTMENT_OTHER): Admitting: Hematology

## 2024-06-22 ENCOUNTER — Inpatient Hospital Stay: Attending: Hematology

## 2024-06-22 VITALS — BP 114/74 | HR 111 | Temp 97.9°F | Resp 20 | Ht 61.0 in | Wt 204.1 lb

## 2024-06-22 DIAGNOSIS — Z86718 Personal history of other venous thrombosis and embolism: Secondary | ICD-10-CM | POA: Insufficient documentation

## 2024-06-22 DIAGNOSIS — N6320 Unspecified lump in the left breast, unspecified quadrant: Secondary | ICD-10-CM

## 2024-06-22 DIAGNOSIS — Z7901 Long term (current) use of anticoagulants: Secondary | ICD-10-CM | POA: Diagnosis not present

## 2024-06-22 DIAGNOSIS — R34 Anuria and oliguria: Secondary | ICD-10-CM | POA: Insufficient documentation

## 2024-06-22 DIAGNOSIS — Z992 Dependence on renal dialysis: Secondary | ICD-10-CM | POA: Diagnosis not present

## 2024-06-22 DIAGNOSIS — R091 Pleurisy: Secondary | ICD-10-CM | POA: Diagnosis not present

## 2024-06-22 DIAGNOSIS — I82412 Acute embolism and thrombosis of left femoral vein: Secondary | ICD-10-CM

## 2024-06-22 DIAGNOSIS — D631 Anemia in chronic kidney disease: Secondary | ICD-10-CM | POA: Diagnosis not present

## 2024-06-22 DIAGNOSIS — N183 Chronic kidney disease, stage 3 unspecified: Secondary | ICD-10-CM | POA: Diagnosis not present

## 2024-06-22 DIAGNOSIS — M25519 Pain in unspecified shoulder: Secondary | ICD-10-CM | POA: Diagnosis not present

## 2024-06-22 DIAGNOSIS — N186 End stage renal disease: Secondary | ICD-10-CM | POA: Insufficient documentation

## 2024-06-22 LAB — CBC WITH DIFFERENTIAL (CANCER CENTER ONLY)
Abs Immature Granulocytes: 0.02 10*3/uL (ref 0.00–0.07)
Basophils Absolute: 0 10*3/uL (ref 0.0–0.1)
Basophils Relative: 0 %
Eosinophils Absolute: 0.1 10*3/uL (ref 0.0–0.5)
Eosinophils Relative: 1 %
HCT: 37.4 % (ref 36.0–46.0)
Hemoglobin: 11.9 g/dL — ABNORMAL LOW (ref 12.0–15.0)
Immature Granulocytes: 0 %
Lymphocytes Relative: 20 %
Lymphs Abs: 2 10*3/uL (ref 0.7–4.0)
MCH: 28.7 pg (ref 26.0–34.0)
MCHC: 31.8 g/dL (ref 30.0–36.0)
MCV: 90.1 fL (ref 80.0–100.0)
Monocytes Absolute: 1 10*3/uL (ref 0.1–1.0)
Monocytes Relative: 10 %
Neutro Abs: 6.7 10*3/uL (ref 1.7–7.7)
Neutrophils Relative %: 69 %
Platelet Count: 179 10*3/uL (ref 150–400)
RBC: 4.15 MIL/uL (ref 3.87–5.11)
RDW: 14.1 % (ref 11.5–15.5)
WBC Count: 9.8 10*3/uL (ref 4.0–10.5)
nRBC: 0 % (ref 0.0–0.2)

## 2024-06-22 LAB — CMP (CANCER CENTER ONLY)
ALT: 11 U/L (ref 0–44)
AST: 15 U/L (ref 15–41)
Albumin: 3.8 g/dL (ref 3.5–5.0)
Alkaline Phosphatase: 127 U/L — ABNORMAL HIGH (ref 38–126)
Anion gap: 12 (ref 5–15)
BUN: 32 mg/dL — ABNORMAL HIGH (ref 8–23)
CO2: 28 mmol/L (ref 22–32)
Calcium: 9.2 mg/dL (ref 8.9–10.3)
Chloride: 97 mmol/L — ABNORMAL LOW (ref 98–111)
Creatinine: 8.16 mg/dL (ref 0.44–1.00)
GFR, Estimated: 5 mL/min — ABNORMAL LOW (ref >60)
Glucose, Bld: 95 mg/dL (ref 70–99)
Potassium: 4.6 mmol/L (ref 3.5–5.1)
Sodium: 137 mmol/L (ref 135–145)
Total Bilirubin: 0.4 mg/dL (ref 0.0–1.2)
Total Protein: 7.4 g/dL (ref 6.5–8.1)

## 2024-06-22 NOTE — Telephone Encounter (Signed)
 Critical lab value reported: Scr+ 8.16 BUN 32  Pt is ESRD on dialysis.  Notified Dr. Lanny and her Team.

## 2024-06-22 NOTE — Progress Notes (Signed)
 [facility]   Telephone:(336) [phone] Fax:(336) [phone]   Clinic Follow up Note   Patient Care Team: Georgina Speaks, FNP as PCP - General (General Practice) Ladona Heinz, MD as PCP - Cardiology (Cardiology) Lanny Callander, MD as Consulting Physician (Hematology) [facility], [facility] [address] Clayborne CROME, RN as [facility] Ladona Heinz, MD as Consulting Physician (Cardiology) Macel Jayson PARAS, MD as Consulting Physician (Internal Medicine)  Date of Service:  06/22/2024  CHIEF COMPLAINT: f/u of anemia and history of DVT  CURRENT THERAPY:  Eliquis  2.5 mg twice daily  Assessment & Plan End-stage renal disease on dialysis End-stage renal disease requiring permanent dialysis with anuria. Persistent pain during dialysis, possibly related to shoulder fracture or pleurisy, despite pain management.  Shoulder pain due to fracture Persistent shoulder pain from fracture sustained in a fall, exacerbated during dialysis. Limited range of motion, unable to fully raise arm. Undergoing physical therapy with persistent pain. Follow-up with orthopedic specialist, Dr. Germaine, is planned.  Pleurisy Left-sided chest pain initially attributed to pleurisy, improved but persists with deep breaths. Pain management with ibuprofen  and steroids was attempted, but pain persists.  Anemia of chronic disease Anemia is well-managed with hemoglobin at 11.9. No erythropoiesis-stimulating agents administered at dialysis center. She receives vitamin D  and calcium  medication.  History of Deep vein thrombosis On Eliquis  indefinitely for DVT. No significant bleeding issues, but bruising noted post-ibuprofen . Risks of bleeding and bruising, especially with falls, discussed. Prefers to continue Eliquis  barring significant bleeding or frequent falls. - Continue Eliquis  barring significant bleeding or frequent falls.  Plan -Labs reviewed, hemoglobin 11.9 - Continue Eliquis  2.5 mg twice  daily - Patient prefers to follow-up with us , we will see her back in 1 year   Discussed the use of AI scribe software for clinical note transcription with the patient, who gave verbal consent to proceed.  History of Present Illness Whitney Walker is a 68 year old female with anemia of chronic disease who presents for follow-up.  She experiences significant shoulder pain following a fall, which is exacerbated during dialysis sessions. Despite physical therapy and various pain medications, including morphine , the pain persists, leading to multiple hospital visits. She is currently taking Tylenol  and has been prescribed Tylenol  with codeine  for pain management.  She is on Eliquis  for DVT and reports no significant bleeding issues, although she experienced bruising with ibuprofen . Her current hemoglobin level is 11.9. She is not receiving any growth factor for anemia but is on vitamin D  supplementation. She has no urine output and is on permanent dialysis. She receives a medication starting with 'M' for anemia at the dialysis center.     All other systems were reviewed with the patient and are negative.  MEDICAL HISTORY:  Past Medical History:  Diagnosis Date   Acute left-sided low back pain without sciatica 02/22/2024   Anemia    Anxiety    CHF (congestive heart failure) (HCC)    Colon cancer (HCC)    treatment surgery   Complication of anesthesia    after first C- [place] couldnt walk after, patient denies having a spinal   COPD (chronic obstructive pulmonary disease) (HCC)    Coronary artery disease    Depression    DVT (deep venous thrombosis) (HCC)    Encounter for annual health examination 08/05/2023   ESRD on hemodialysis (HCC)    Hemo: MWF   History of blood transfusion 04/2018   Hypertension    07/07/18- no longer takes BP  medications   Left hip pain 05/16/2022   Left leg pain 05/09/2020   Leucocytosis 04/28/2012   Meningitis    Orthostatic hypotension 05/16/2022    Pain in limb 07/30/2013   PE (pulmonary embolism)    Peripheral vascular disease (HCC)    Primary hypertension 11/15/2013   Rash 11/25/2023   Restless legs    Shortness of breath    with exertion   Sleep apnea    SOB (shortness of breath) 03/03/2019   Vertigo     SURGICAL HISTORY: Past Surgical History:  Procedure Laterality Date   ABDOMINAL AORTAGRAM N/A 07/26/2013   Procedure: ABDOMINAL EZELLA;  Surgeon: Redell LITTIE Door, MD;  Location: [facility] CATH LAB;  Service: Cardiovascular;  Laterality: N/A;   ABDOMINAL HYSTERECTOMY     AV FISTULA PLACEMENT Left 05/05/2018   Procedure: ARTERIOVENOUS (AV) FISTULA CREATION BRACHIOCEPHALIC;  Surgeon: Sheree Penne Bruckner, MD;  Location: [facility] OR;  Service: Vascular;  Laterality: Left;   AV FISTULA PLACEMENT Left 07/16/2018   Procedure: ARTERIOVENOUS FISTULA CREATION;  Surgeon: Sheree Penne Bruckner, MD;  Location[facility] OR;  Service: Vascular;  Laterality: Left;   BASCILIC VEIN TRANSPOSITION Left 09/03/2018   Procedure: BASILIC VEIN TRANSPOSITION SECOND STAGE;  Surgeon: Sheree Penne Bruckner, MD;  Location: [facility] OR;  Service: Vascular;  Laterality: Left;   BREAST BIOPSY Left    CESAREAN SECTION     X 3 1974-1977   CHOLECYSTECTOMY     CHOLECYSTECTOMY  1980/s   COLECTOMY  2010   DIVERTICULOSIS SURGERY-2002  2012   FEMORAL-POPLITEAL BYPASS GRAFT Left 08/13/2013   Procedure: BYPASS GRAFT FEMORAL-POPLITEAL ARTERY WITH NON-REVERSED SAPHANEOUS VEIN; ULTRASOUND GUIDED;  Surgeon: Lynwood JONETTA Collum, MD;  Location: [facility] OR;  Service: Vascular;  Laterality: Left;   FLEXIBLE SIGMOIDOSCOPY N/A 12/10/2022   Procedure: FLEXIBLE SIGMOIDOSCOPY;  Surgeon: Rollin Dover, MD;  Location: [facility];  Service: Gastroenterology;  Laterality: N/A;   INSERTION OF DIALYSIS CATHETER Right 04/30/2018   Procedure: INSERTION OF Right Internal Jugular DIALYSIS CATHETER;  Surgeon: Serene Gaile ORN, MD;  Location: [facility] OR;  Service: Vascular;  Laterality: Right;   IR FLUORO GUIDE CV  LINE LEFT  05/01/2022   IR FLUORO GUIDE CV LINE LEFT  05/21/2022   IR FLUORO GUIDE CV LINE RIGHT  07/24/2020   IR FLUORO GUIDE CV LINE RIGHT  07/21/2021   IR FLUORO GUIDE CV LINE RIGHT  05/20/2022   IR PTA VENOUS EXCEPT DIALYSIS CIRCUIT  05/21/2022   IR REMOVAL TUN CV CATH W/O FL  07/21/2020   IR REMOVAL TUN CV CATH W/O FL  07/19/2021   IR REMOVAL TUN CV CATH W/O FL  05/17/2022   IR US  GUIDE VASC ACCESS LEFT  05/21/2022   IR US  GUIDE VASC ACCESS RIGHT  07/24/2020   IR US  GUIDE VASC ACCESS RIGHT  07/21/2021   IR US  GUIDE VASC ACCESS RIGHT  05/20/2022   IR VENOCAVAGRAM SVC  05/21/2022   LEFT HEART CATH AND CORONARY ANGIOGRAPHY N/A 05/01/2021   Procedure: LEFT HEART CATH AND CORONARY ANGIOGRAPHY;  Surgeon: Ladona Heinz, MD;  Location: [facility] INVASIVE CV LAB;  Service: Cardiovascular;  Laterality: N/A;   LEFT HEART CATH AND CORONARY ANGIOGRAPHY N/A 05/02/2022   Procedure: LEFT HEART CATH AND CORONARY ANGIOGRAPHY;  Surgeon: Ladona Heinz, MD;  Location: [facility] INVASIVE CV LAB;  Service: Cardiovascular;  Laterality: N/A;   LOWER EXTREMITY ANGIOGRAM Left 07/26/2013   Procedure: LOWER EXTREMITY ANGIOGRAM;  Surgeon: Redell LITTIE Door, MD;  Location: [facility] CATH LAB;  Service: Cardiovascular;  Laterality: Left;  I have reviewed the social history and family history with the patient and they are unchanged from previous note.  ALLERGIES:  is allergic to difelikefalin, clindamycin /lincomycin, gadolinium derivatives, and naltrexone.  MEDICATIONS:  Current Outpatient Medications  Medication Sig Dispense Refill   acetaminophen  (TYLENOL ) 650 MG CR tablet Take 1,950 mg by mouth every 8 (eight) hours as needed for pain.     albuterol  (VENTOLIN  HFA) 108 (90 Base) MCG/ACT inhaler Inhale 2 puffs into the lungs every 6 (six) hours as needed for wheezing or shortness of breath. 18 g 2   atorvastatin  (LIPITOR) 20 MG tablet Take 1 tablet (20 mg total) by mouth daily. (Patient taking differently: Take 20 mg by mouth at bedtime.) 30 tablet 6   Biotin   5 MG CAPS Take 5 mg by mouth daily at 6 (six) AM.     doxycycline  (ADOXA) 100 MG tablet Take 100 mg by mouth 2 (two) times daily. Prescribed by Dr. Macel, nephrologist     ELIQUIS  5 MG TABS tablet TAKE 1 TABLET (5 MG TOTAL) BY MOUTH 2 (TWO) TIMES DAILY. 60 tablet 2   meclizine  (ANTIVERT ) 12.5 MG tablet Take 1 tablet (12.5 mg total) by mouth 3 (three) times daily as needed for dizziness. 90 tablet 1   midodrine  (PROAMATINE ) 5 MG tablet Take 5 mg by mouth 2 (two) times daily as needed (hypotension).     multivitamin (RENA-VIT) TABS tablet Take 1 tablet by mouth at bedtime.     omeprazole  (PRILOSEC) 20 MG capsule TAKE 1 CAPSULE (20 MG TOTAL) BY MOUTH DAILY. 90 capsule 1   sevelamer  carbonate (RENVELA ) 800 MG tablet Take 800 mg by mouth at bedtime.     sodium zirconium cyclosilicate  (LOKELMA ) 10 g PACK packet Take 10 g by mouth daily.     Tiotropium Bromide-Olodaterol (STIOLTO RESPIMAT ) 2.5-2.5 MCG/ACT AERS Inhale 2 puffs into the lungs daily. 1 each 11   vitamin C  (ASCORBIC ACID ) 250 MG tablet Take 250 mg by mouth daily.     vitamin E 200 UNIT capsule Take 200 Units by mouth daily.     No current facility-administered medications for this visit.    PHYSICAL EXAMINATION: ECOG PERFORMANCE STATUS: 1 - Symptomatic but completely ambulatory  Vitals:   06/22/24 1241  BP: 114/74  Pulse: (!) 111  Resp: 20  Temp: 97.9 F (36.6 C)  SpO2: 94%   Wt Readings from Last 3 Encounters:  06/22/24 204 lb 1.6 oz (92.6 kg)  06/15/24 201 lb 9.6 oz (91.4 kg)  05/19/24 200 lb (90.7 kg)     GENERAL:alert, no distress and comfortable SKIN: skin color, texture, turgor are normal, no rashes or significant lesions EYES: normal, Conjunctiva are pink and non-injected, sclera clear Musculoskeletal:no cyanosis of digits and no clubbing  NEURO: alert & oriented x 3 with fluent speech, no focal motor/sensory deficits  Physical Exam    LABORATORY DATA:  I have reviewed the data as listed    Latest Ref Rng  & Units 06/22/2024   12:22 PM 05/20/2024    6:16 AM 05/19/2024   10:19 AM  CBC  WBC 4.0 - 10.5 K/uL 9.8  14.7  14.3   Hemoglobin 12.0 - 15.0 g/dL 88.0  88.1  87.4   Hematocrit 36.0 - 46.0 % 37.4  37.9  39.9   Platelets 150 - 400 K/uL 179  215  205         Latest Ref Rng & Units 06/22/2024   12:22 PM 05/20/2024    6:16 AM 05/19/2024  10:19 AM  CMP  Glucose 70 - 99 mg/dL 95  834  859   BUN 8 - 23 mg/dL 32  30  15   Creatinine 0.44 - 1.00 mg/dL 1.83  2.88  5.40   Sodium 135 - 145 mmol/L 137  132  133   Potassium 3.5 - 5.1 mmol/L 4.6  4.8  4.0   Chloride 98 - 111 mmol/L 97  94  96   CO2 22 - 32 mmol/L 28  24  27    Calcium  8.9 - 10.3 mg/dL 9.2  8.0  7.5   Total Protein 6.5 - 8.1 g/dL 7.4   6.5   Total Bilirubin 0.0 - 1.2 mg/dL 0.4   0.6   Alkaline Phos 38 - 126 U/L 127   89   AST 15 - 41 U/L 15   17   ALT 0 - 44 U/L 11   16       RADIOGRAPHIC STUDIES: I have personally reviewed the radiological images as listed and agreed with the findings in the report. No results found.    No orders of the defined types were placed in this encounter.  All questions were answered. The patient knows to call the clinic with any problems, questions or concerns. No barriers to learning was detected. The total time spent in the appointment was 20 minutes, including review of chart and various tests results, discussions about plan of care and coordination of care plan     Onita Mattock, MD 06/22/2024

## 2024-06-23 DIAGNOSIS — D509 Iron deficiency anemia, unspecified: Secondary | ICD-10-CM | POA: Diagnosis not present

## 2024-06-23 DIAGNOSIS — Z992 Dependence on renal dialysis: Secondary | ICD-10-CM | POA: Diagnosis not present

## 2024-06-23 DIAGNOSIS — E875 Hyperkalemia: Secondary | ICD-10-CM | POA: Diagnosis not present

## 2024-06-23 DIAGNOSIS — N2581 Secondary hyperparathyroidism of renal origin: Secondary | ICD-10-CM | POA: Diagnosis not present

## 2024-06-23 DIAGNOSIS — N186 End stage renal disease: Secondary | ICD-10-CM | POA: Diagnosis not present

## 2024-06-23 DIAGNOSIS — D689 Coagulation defect, unspecified: Secondary | ICD-10-CM | POA: Diagnosis not present

## 2024-06-23 NOTE — Assessment & Plan Note (Signed)
 Stable, will check A1c. Continue focusing on healthy diet.

## 2024-06-23 NOTE — Assessment & Plan Note (Signed)
Continue statin, tolerating well.  No complaints of numbness or tingling to her feet bilaterally

## 2024-06-23 NOTE — Assessment & Plan Note (Signed)
 Blood pressure is fairly controlled.  Continue current medications and follow-up with cardiology.

## 2024-06-23 NOTE — Assessment & Plan Note (Signed)
 She is encouraged to strive for BMI less than 30 to decrease cardiac risk. Advised to aim for at least 150 minutes of exercise per week.

## 2024-06-23 NOTE — Assessment & Plan Note (Signed)
 She is able to lift her right shoulder now. Worsens with dialysis, I have advised her to contact her orthopedic to see next options since she is not getting PT right now. No significant abnormal findings on physical exam

## 2024-06-25 DIAGNOSIS — D509 Iron deficiency anemia, unspecified: Secondary | ICD-10-CM | POA: Diagnosis not present

## 2024-06-25 DIAGNOSIS — N186 End stage renal disease: Secondary | ICD-10-CM | POA: Diagnosis not present

## 2024-06-25 DIAGNOSIS — D689 Coagulation defect, unspecified: Secondary | ICD-10-CM | POA: Diagnosis not present

## 2024-06-25 DIAGNOSIS — N2581 Secondary hyperparathyroidism of renal origin: Secondary | ICD-10-CM | POA: Diagnosis not present

## 2024-06-25 DIAGNOSIS — Z992 Dependence on renal dialysis: Secondary | ICD-10-CM | POA: Diagnosis not present

## 2024-06-25 DIAGNOSIS — E875 Hyperkalemia: Secondary | ICD-10-CM | POA: Diagnosis not present

## 2024-06-27 NOTE — Progress Notes (Unsigned)
 Cardiology Office Note    Date:  06/30/2024  ID:  Whitney Walker, DOB 02/18/1956, MRN [id number] PCP:  Georgina Speaks, FNP  Cardiologist:  Gordy Bergamo, MD  Electrophysiologist:  None   Chief Complaint: Follow up for aortic sclerosis   History of Present Illness: .    Whitney Walker is a 68 y.o. female with visit-pertinent history of hypertension, hyperlipidemia, tobacco use, obstructive sleep apnea not on CPAP, COPD, severe ER peripheral arterial disease with history of left femoral to popliteal bypass surgery in 2014, chronic anticoagulation with Eliquis  due to recurrent DVT and PE in 2014 and 2016, ESRD on hemodialysis on Monday/Wednesday/Friday.  Left heart catheterization on 05/02/2022 indicated mild to moderate diffuse disease that is nonobstructive.  Echocardiogram on 05/18/2022 indicated LVEF 70 to 75%, no RWMA, moderate LVH, G1 DD, RV systolic function and size was normal, moderate mitral annular calcification, mild aortic valvular stenosis, mild calcification of the aortic valve.  Patient was last seen in clinic on 01/20/2024 by Dr. Bergamo with recent chest pain and arm pain.  Chest pain described as a sharp pain in the left breast area, lasting for a couple of days as well as pain in the arm.  Patient also reports fluctuations in blood pressure during dialysis sessions.  On 05/19/2024 patient presented to the ED that presented with chest pain for about 3 weeks prior to admission, worsened with deep breaths in the ED was found to be tachycardic and hypotensive, hypoxic at 79% on room air with white blood count 14,000 high-sensitivity troponin 27>>28>>27>>22>>28.  CRP was 2.5.  Her pain was reproducible with palpation from the right sternum all the way under her breast was pleuritic in nature.  Echocardiogram showed no acute findings, she was given prednisone  with no improvement and was started on low-dose NSAIDs and reported her pain significantly improved.  Echocardiogram on 05/20/2024  indicated LVEF 55 to 60%, no RWMA, moderate LVH, G1 DD, RV systolic function and size was normal, normal PASP, mitral valve was normal in structure, trivial mitral valve regurgitation, moderate mitral annular calcification, severe calcification of the aortic valve with mild regurgitation, mild aortic valve stenosis.  Today she presents regarding her echocardiogram results, notes that her PCP recommended she follow-up with Dr. Bergamo.  Patient reports that she is doing well overall, notes that she has had some recent problems with pleurisy however this has been improving in the last 2 weeks.  She also reports problems with her right shoulder, reports that she has some broken bones to the area and have been having ongoing pain.  She reports she had a steroid injection yesterday, she is unsure if she has had any significant improvement yet.  She denies any chest pain on exertion, reports chronic and stable mild shortness of breath related to COPD, denies any significant changes.  She denies increased lower extremity edema, orthopnea or PND, denies palpitations, presyncope or syncope.  ROS: .   Today she denies clower extremity edema, fatigue, palpitations, melena, hematuria, hemoptysis, diaphoresis, weakness, presyncope, syncope, orthopnea, and PND.  All other systems are reviewed and otherwise negative.  Studies Reviewed: SABRA    EKG:  EKG is not ordered today.  CV Studies: Cardiac studies reviewed are outlined and summarized above. Otherwise please see EMR for full report. Cardiac Studies & Procedures   ______________________________________________________________________________________________ CARDIAC CATHETERIZATION  CARDIAC CATHETERIZATION 05/02/2022  Conclusion Left Heart Catheterization 05/02/22: RCA: Dominant.  Mild diffuse disease. Left main: Large vessel, very short and immediately bifurcates.  Calcified. LCx:  Large vessel, [place] to a very high large OM1, a moderate-sized OM 2 distally,  mild to moderate diffuse disease is present.  No high-grade stenosis. LAD: Large vessel, gives coronary to a very large D1, mild diffuse disease again evident without any focal high-grade stenosis.  Diffuse coronary calcification is again evident.  LAD is tortuous in the mid to distal segment suggestive of hypertensive heart disease.  Recommendation: [name] positive cardiac markers probably related to very small vessel disease.  Do not see any obvious dissection in the major coronary vessels or thrombus formation or occlusion.  No significant change in coronary anatomy since prior angiography a year ago in 2022.  Recommend continuing medical therapy.  30 mL contrast utilized.  Findings Coronary Findings Diagnostic  Dominance: Right  Left Anterior Descending There is mild diffuse disease throughout the vessel.  Left Circumflex There is mild diffuse disease throughout the vessel. Ost Cx to Prox Cx lesion is 30% stenosed. Mid Cx to Dist Cx lesion is 30% stenosed. The lesion is calcified.  Right Coronary Artery The vessel exhibits minimal luminal irregularities. Prox RCA to Mid RCA lesion is 30% stenosed. The lesion is calcified.  Intervention  No interventions have been documented.   CARDIAC CATHETERIZATION  CARDIAC CATHETERIZATION 05/01/2021  Conclusion Left heart catheterization 05/01/2021: Moderate disease in the right coronary artery and circumflex coronary artery with diffuse coronary calcification, minimal disease in the LAD.  Normal LV systolic function and normal LVEDP.  No pressure gradient across aortic valve.  Recommendation: Evaluation for noncardiac causes of chest pain is indicated.  30 - 40 mL contrast utilized.  Right femoral arterial access closed with minx.  Findings Coronary Findings Diagnostic  Dominance: Right  Left Circumflex Ost Cx to Prox Cx lesion is 30% stenosed. Mid Cx to Dist Cx lesion is 30% stenosed. The lesion is calcified.  Right Coronary  Artery Prox RCA to Mid RCA lesion is 30% stenosed. The lesion is calcified.  Intervention  No interventions have been documented.   STRESS TESTS  PCV MYOCARDIAL PERFUSION WO LEXISCAN  11/20/2020  Narrative Tetrofosmin  stress test 01/02/2021: Lexiscan  not administered at patient's request. Incomplete study. EKG tracing not available for review. Rest images show mild decrease in tracer update in basal inferior myocardium. Consider alternate ischemia studies, if clinically indicated.   ECHOCARDIOGRAM  ECHOCARDIOGRAM COMPLETE 05/20/2024  Narrative ECHOCARDIOGRAM REPORT    Patient Name:   [facility] Date of Exam: 05/20/2024 Medical Rec #:  [id number]            Height:       61.0 in Accession #:    [id number]           Weight:       200.0 lb Date of Birth:  August 09, 1956             BSA:          1.889 m Patient Age:    67 years             BP:           111/65 mmHg Patient Gender: F                    HR:           108 bpm. Exam Location:  Inpatient  Procedure: 2D Echo, Cardiac Doppler and Color Doppler (Both Spectral and Color Flow Doppler were utilized during procedure).  Indications:    R07.9* Chest pain, unspecified  History:  Patient has prior history of Echocardiogram examinations, most recent 05/18/2022. CHF, Signs/Symptoms:Chest Pain, Shortness of Breath and Dyspnea; Risk Factors:Current Smoker, Dyslipidemia and Diabetes. ESRD. Pulmonary embolus.  Sonographer:    Ellouise Mose RDCS Referring Phys: [id number] [facility]   Sonographer Comments: Technically difficult study due to poor echo windows and patient is obese. Image acquisition challenging due to patient body habitus. Patient leaving AMA. Convinced her to let me get echo done. IMPRESSIONS   1. Left ventricular ejection fraction, by estimation, is 55 to 60%. The left ventricle has normal function. The left ventricle has no regional wall motion abnormalities. There is moderate left ventricular hypertrophy.  Left ventricular diastolic parameters are consistent with Grade I diastolic dysfunction (impaired relaxation). 2. Right ventricular systolic function is normal. The right ventricular size is normal. There is normal pulmonary artery systolic pressure. The estimated right ventricular systolic pressure is 27.0 mmHg. 3. The mitral valve is normal in structure. Trivial mitral valve regurgitation. Moderate mitral annular calcification. 4. The aortic valve is calcified. There is severe calcifcation of the aortic valve. Aortic valve regurgitation is mild. Mild aortic valve stenosis. Aortic valve area, by VTI measures 1.84 cm. Aortic valve mean gradient measures 12.0 mmHg. Aortic valve Vmax measures 2.32 m/s. 5. The inferior vena cava is normal in size with greater than 50% respiratory variability, suggesting right atrial pressure of 3 mmHg.  FINDINGS Left Ventricle: Left ventricular ejection fraction, by estimation, is 55 to 60%. The left ventricle has normal function. The left ventricle has no regional wall motion abnormalities. The left ventricular internal cavity size was normal in size. There is moderate left ventricular hypertrophy. Left ventricular diastolic parameters are consistent with Grade I diastolic dysfunction (impaired relaxation).  Right Ventricle: The right ventricular size is normal. No increase in right ventricular wall thickness. Right ventricular systolic function is normal. There is normal pulmonary artery systolic pressure. The tricuspid regurgitant velocity is 2.45 m/s, and with an assumed right atrial pressure of 3 mmHg, the estimated right ventricular systolic pressure is 27.0 mmHg.  Left Atrium: Left atrial size was normal in size.  Right Atrium: Right atrial size was normal in size.  Pericardium: There is no evidence of pericardial effusion.  Mitral Valve: The mitral valve is normal in structure. There is mild thickening of the mitral valve leaflet(s). Moderate mitral  annular calcification. Trivial mitral valve regurgitation. MV peak gradient, 14.6 mmHg. The mean mitral valve gradient is 5.5 mmHg.  Tricuspid Valve: The tricuspid valve is normal in structure. Tricuspid valve regurgitation is trivial.  Aortic Valve: The aortic valve is calcified. There is severe calcifcation of the aortic valve. Aortic valve regurgitation is mild. Mild aortic stenosis is present. Aortic valve mean gradient measures 12.0 mmHg. Aortic valve peak gradient measures 21.5 mmHg. Aortic valve area, by VTI measures 1.84 cm.  Pulmonic Valve: The pulmonic valve was not well visualized. Pulmonic valve regurgitation is not visualized.  Aorta: The aortic root and ascending aorta are structurally normal, with no evidence of dilitation.  Venous: The inferior vena cava is normal in size with greater than 50% respiratory variability, suggesting right atrial pressure of 3 mmHg.  IAS/Shunts: No atrial level shunt detected by color flow Doppler.   LEFT VENTRICLE PLAX 2D LVIDd:         3.40 cm     Diastology LVIDs:         2.20 cm     LV e' medial:    5.55 cm/s LV PW:  1.50 cm     LV E/e' medial:  21.3 LV IVS:        1.50 cm     LV e' lateral:   5.87 cm/s LVOT diam:     2.30 cm     LV E/e' lateral: 20.1 LV SV:         66 LV SV Index:   35 LVOT Area:     4.15 cm  LV Volumes (MOD) LV vol d, MOD A2C: 61.5 ml LV vol d, MOD A4C: 47.0 ml LV vol s, MOD A2C: 21.8 ml LV vol s, MOD A4C: 13.1 ml LV SV MOD A2C:     39.7 ml LV SV MOD A4C:     47.0 ml LV SV MOD BP:      38.9 ml  RIGHT VENTRICLE             IVC RV S prime:     12.70 cm/s  IVC diam: 1.60 cm TAPSE (M-mode): 1.5 cm  LEFT ATRIUM             Index        RIGHT ATRIUM          Index LA diam:        4.10 cm 2.17 cm/m   RA Area:     9.64 cm LA Vol (A2C):   31.1 ml 16.47 ml/m  RA Volume:   16.70 ml 8.84 ml/m LA Vol (A4C):   25.3 ml 13.40 ml/m LA Biplane Vol: 28.4 ml 15.04 ml/m AORTIC VALVE AV Area (Vmax):    2.15  cm AV Area (Vmean):   1.99 cm AV Area (VTI):     1.84 cm AV Vmax:           232.00 cm/s AV Vmean:          157.000 cm/s AV VTI:            0.362 m AV Peak Grad:      21.5 mmHg AV Mean Grad:      12.0 mmHg LVOT Vmax:         120.00 cm/s LVOT Vmean:        75.200 cm/s LVOT VTI:          0.160 m LVOT/AV VTI ratio: 0.44  AORTA Ao Root diam: 3.20 cm Ao Asc diam:  3.00 cm  MITRAL VALVE                TRICUSPID VALVE MV Area (PHT): 4.60 cm     TR Peak grad:   24.0 mmHg MV Area VTI:   1.84 cm     TR Vmax:        245.00 cm/s MV Peak grad:  14.6 mmHg MV Mean grad:  5.5 mmHg     SHUNTS MV Vmax:       1.91 m/s     Systemic VTI:  0.16 m MV Vmean:      113.5 cm/s   Systemic Diam: 2.30 cm MV Decel Time: 165 msec MV E velocity: 118.00 cm/s MV A velocity: 184.00 cm/s MV E/A ratio:  0.64  Aditya Sabharwal Electronically signed by Ria Commander Signature Date/Time: 05/20/2024/5:10:34 PM    Final          ______________________________________________________________________________________________       Current Reported Medications:.    Current Meds  Medication Sig   acetaminophen  (TYLENOL ) 650 MG CR tablet Take 1,950 mg by mouth every 8 (eight) hours as needed for pain.  albuterol  (VENTOLIN  HFA) 108 (90 Base) MCG/ACT inhaler Inhale 2 puffs into the lungs every 6 (six) hours as needed for wheezing or shortness of breath.   atorvastatin  (LIPITOR) 20 MG tablet Take 1 tablet (20 mg total) by mouth daily. (Patient taking differently: Take 20 mg by mouth at bedtime.)   Biotin  5 MG CAPS Take 5 mg by mouth daily at 6 (six) AM.   doxycycline  (ADOXA) 100 MG tablet Take 100 mg by mouth 2 (two) times daily. Prescribed by Dr. Macel, nephrologist   ELIQUIS  5 MG TABS tablet TAKE 1 TABLET (5 MG TOTAL) BY MOUTH 2 (TWO) TIMES DAILY.   meclizine  (ANTIVERT ) 12.5 MG tablet Take 1 tablet (12.5 mg total) by mouth 3 (three) times daily as needed for dizziness.   midodrine  (PROAMATINE ) 5 MG  tablet Take 5 mg by mouth 2 (two) times daily as needed (hypotension).   multivitamin (RENA-VIT) TABS tablet Take 1 tablet by mouth at bedtime.   omeprazole  (PRILOSEC) 20 MG capsule TAKE 1 CAPSULE (20 MG TOTAL) BY MOUTH DAILY.   sevelamer  carbonate (RENVELA ) 800 MG tablet Take 800 mg by mouth at bedtime.   sodium zirconium cyclosilicate  (LOKELMA ) 10 g PACK packet Take 10 g by mouth daily.   Tiotropium Bromide-Olodaterol (STIOLTO RESPIMAT ) 2.5-2.5 MCG/ACT AERS Inhale 2 puffs into the lungs daily.   vitamin C  (ASCORBIC ACID ) 250 MG tablet Take 250 mg by mouth daily.   vitamin E 200 UNIT capsule Take 200 Units by mouth daily.    Physical Exam:    VS:  BP 118/78   Pulse 99   Ht 5' 1 (1.549 m)   Wt 200 lb 6.4 oz (90.9 kg)   SpO2 95%   BMI 37.87 kg/m    Wt Readings from Last 3 Encounters:  06/29/24 200 lb 6.4 oz (90.9 kg)  06/22/24 204 lb 1.6 oz (92.6 kg)  06/15/24 201 lb 9.6 oz (91.4 kg)    GEN: Well nourished, well developed in no acute distress NECK: No JVD; No carotid bruits CARDIAC: RRR, no murmurs, rubs, gallops RESPIRATORY:  Clear to auscultation without rales, wheezing or rhonchi  ABDOMEN: Soft, non-tender, non-distended EXTREMITIES:  No edema; No acute deformity     Asessement and Plan:.    Chronic diastolic HF: Echocardiogram on 05/20/2024 indicated LVEF 55 to 60%, no RWMA, moderate LVH, G1 DD, RV systolic function and size was normal, normal PASP, mitral valve was normal in structure, trivial mitral valve regurgitation, moderate mitral annular calcification, severe calcification of the aortic valve with mild regurgitation, mild aortic valve stenosis.  She appears euvolemic and well compensated on exam.  Has been unable to tolerate GDMT medications given orthostatic hypotension, on midodrine  with dialysis.  CAD: Mild to moderate nonobstructive disease noted on cardiac catheterization on 05/18/22. Today she reports chest pain related to her pleurisy and musculoskeletal pain,  reports that this is significantly improving.  Denies any exertional angina.  Severe calcification of aortic valve: Noted on echocardiogram on 05/20/2024, mild aortic valve stenosis with mild regurgitation.  Patient notes musculoskeletal chest pain, denies any chest pain with exertion, denies any changes in some mild chronic shortness of breath.  She appears euvolemic and well compensated on exam.  She is currently on Eliquis  and on statin.  PAD: Patient denies any recurrence of any claudication symptoms.  She reports that she has been doing well overall.  Patient reports that she has been taking atorvastatin  20 mg daily, will check fasting lipid profile and LFTs.  Of note was  previously unable to tolerate atorvastatin  40 mg daily, rosuvastatin not a good option given ESRD.  Orthostatic hypotension: She denies any significant symptoms of dizziness or syncope.  History of DVT: On Eliquis  2.5 mg twice daily.  Managed per hematology.    Disposition: F/u with Dr. Ladona in six months or sooner if needed.   Signed, Karyn Brull D Tanyon Alipio, NP

## 2024-06-28 ENCOUNTER — Encounter (HOSPITAL_BASED_OUTPATIENT_CLINIC_OR_DEPARTMENT_OTHER): Payer: Self-pay

## 2024-06-28 DIAGNOSIS — I509 Heart failure, unspecified: Secondary | ICD-10-CM | POA: Diagnosis not present

## 2024-06-28 DIAGNOSIS — I129 Hypertensive chronic kidney disease with stage 1 through stage 4 chronic kidney disease, or unspecified chronic kidney disease: Secondary | ICD-10-CM | POA: Diagnosis not present

## 2024-06-28 DIAGNOSIS — N2581 Secondary hyperparathyroidism of renal origin: Secondary | ICD-10-CM | POA: Diagnosis not present

## 2024-06-28 DIAGNOSIS — D689 Coagulation defect, unspecified: Secondary | ICD-10-CM | POA: Diagnosis not present

## 2024-06-28 DIAGNOSIS — Z992 Dependence on renal dialysis: Secondary | ICD-10-CM | POA: Diagnosis not present

## 2024-06-28 DIAGNOSIS — R531 Weakness: Secondary | ICD-10-CM | POA: Diagnosis not present

## 2024-06-28 DIAGNOSIS — E875 Hyperkalemia: Secondary | ICD-10-CM | POA: Diagnosis not present

## 2024-06-28 DIAGNOSIS — D509 Iron deficiency anemia, unspecified: Secondary | ICD-10-CM | POA: Diagnosis not present

## 2024-06-28 DIAGNOSIS — N186 End stage renal disease: Secondary | ICD-10-CM | POA: Diagnosis not present

## 2024-06-28 DIAGNOSIS — G4733 Obstructive sleep apnea (adult) (pediatric): Secondary | ICD-10-CM | POA: Diagnosis not present

## 2024-06-28 DIAGNOSIS — M19011 Primary osteoarthritis, right shoulder: Secondary | ICD-10-CM | POA: Diagnosis not present

## 2024-06-28 DIAGNOSIS — G8929 Other chronic pain: Secondary | ICD-10-CM | POA: Diagnosis not present

## 2024-06-28 DIAGNOSIS — M25511 Pain in right shoulder: Secondary | ICD-10-CM | POA: Diagnosis not present

## 2024-06-28 NOTE — Progress Notes (Signed)
 Done

## 2024-06-28 NOTE — Assessment & Plan Note (Signed)
Stable, continue f/u with Cardiology

## 2024-06-29 ENCOUNTER — Ambulatory Visit: Attending: Cardiology | Admitting: Cardiology

## 2024-06-29 ENCOUNTER — Encounter: Payer: Self-pay | Admitting: Cardiology

## 2024-06-29 VITALS — BP 118/78 | HR 99 | Ht 61.0 in | Wt 200.4 lb

## 2024-06-29 DIAGNOSIS — I5032 Chronic diastolic (congestive) heart failure: Secondary | ICD-10-CM

## 2024-06-29 DIAGNOSIS — I739 Peripheral vascular disease, unspecified: Secondary | ICD-10-CM | POA: Diagnosis not present

## 2024-06-29 DIAGNOSIS — I358 Other nonrheumatic aortic valve disorders: Secondary | ICD-10-CM | POA: Diagnosis not present

## 2024-06-29 DIAGNOSIS — I2510 Atherosclerotic heart disease of native coronary artery without angina pectoris: Secondary | ICD-10-CM | POA: Diagnosis not present

## 2024-06-29 DIAGNOSIS — E7800 Pure hypercholesterolemia, unspecified: Secondary | ICD-10-CM

## 2024-06-29 NOTE — Patient Instructions (Signed)
 Medication Instructions:  No changes *If you need a refill on your cardiac medications before your next appointment, please call your pharmacy*  Lab Work: We are going to need you to get fasting labs drawn If you have labs (blood work) drawn today and your tests are completely normal, you will receive your results only by: MyChart Message (if you have MyChart) OR A paper copy in the mail If you have any lab test that is abnormal or we need to change your treatment, we will call you to review the results.  Testing/Procedures: No testing  Follow-Up: At [facility], you and your health needs are our priority.  As part of our continuing mission to provide you with exceptional heart care, our providers are all part of one team.  This team includes your primary Cardiologist (physician) and Advanced Practice Providers or APPs (Physician Assistants and Nurse Practitioners) who all work together to provide you with the care you need, when you need it.  Your next appointment:   6 month(s)  Provider:   Gordy Bergamo, MD    We recommend signing up for the patient portal called MyChart.  Sign up information is provided on this After Visit Summary.  MyChart is used to connect with patients for Virtual Visits (Telemedicine).  Patients are able to view lab/test results, encounter notes, upcoming appointments, etc.  Non-urgent messages can be sent to your provider as well.   To learn more about what you can do with MyChart, go to [email or web address].

## 2024-06-30 DIAGNOSIS — D509 Iron deficiency anemia, unspecified: Secondary | ICD-10-CM | POA: Diagnosis not present

## 2024-06-30 DIAGNOSIS — Z992 Dependence on renal dialysis: Secondary | ICD-10-CM | POA: Diagnosis not present

## 2024-06-30 DIAGNOSIS — N2581 Secondary hyperparathyroidism of renal origin: Secondary | ICD-10-CM | POA: Diagnosis not present

## 2024-06-30 DIAGNOSIS — N186 End stage renal disease: Secondary | ICD-10-CM | POA: Diagnosis not present

## 2024-06-30 DIAGNOSIS — E875 Hyperkalemia: Secondary | ICD-10-CM | POA: Diagnosis not present

## 2024-06-30 DIAGNOSIS — D689 Coagulation defect, unspecified: Secondary | ICD-10-CM | POA: Diagnosis not present

## 2024-07-05 DIAGNOSIS — Z992 Dependence on renal dialysis: Secondary | ICD-10-CM | POA: Diagnosis not present

## 2024-07-05 DIAGNOSIS — D509 Iron deficiency anemia, unspecified: Secondary | ICD-10-CM | POA: Diagnosis not present

## 2024-07-05 DIAGNOSIS — D689 Coagulation defect, unspecified: Secondary | ICD-10-CM | POA: Diagnosis not present

## 2024-07-05 DIAGNOSIS — N186 End stage renal disease: Secondary | ICD-10-CM | POA: Diagnosis not present

## 2024-07-05 DIAGNOSIS — E875 Hyperkalemia: Secondary | ICD-10-CM | POA: Diagnosis not present

## 2024-07-05 DIAGNOSIS — N2581 Secondary hyperparathyroidism of renal origin: Secondary | ICD-10-CM | POA: Diagnosis not present

## 2024-07-07 DIAGNOSIS — Z992 Dependence on renal dialysis: Secondary | ICD-10-CM | POA: Diagnosis not present

## 2024-07-07 DIAGNOSIS — N2581 Secondary hyperparathyroidism of renal origin: Secondary | ICD-10-CM | POA: Diagnosis not present

## 2024-07-07 DIAGNOSIS — N186 End stage renal disease: Secondary | ICD-10-CM | POA: Diagnosis not present

## 2024-07-07 DIAGNOSIS — D689 Coagulation defect, unspecified: Secondary | ICD-10-CM | POA: Diagnosis not present

## 2024-07-07 DIAGNOSIS — D509 Iron deficiency anemia, unspecified: Secondary | ICD-10-CM | POA: Diagnosis not present

## 2024-07-07 DIAGNOSIS — E875 Hyperkalemia: Secondary | ICD-10-CM | POA: Diagnosis not present

## 2024-07-09 DIAGNOSIS — E875 Hyperkalemia: Secondary | ICD-10-CM | POA: Diagnosis not present

## 2024-07-09 DIAGNOSIS — Z992 Dependence on renal dialysis: Secondary | ICD-10-CM | POA: Diagnosis not present

## 2024-07-09 DIAGNOSIS — N2581 Secondary hyperparathyroidism of renal origin: Secondary | ICD-10-CM | POA: Diagnosis not present

## 2024-07-09 DIAGNOSIS — N186 End stage renal disease: Secondary | ICD-10-CM | POA: Diagnosis not present

## 2024-07-09 DIAGNOSIS — D509 Iron deficiency anemia, unspecified: Secondary | ICD-10-CM | POA: Diagnosis not present

## 2024-07-09 DIAGNOSIS — D689 Coagulation defect, unspecified: Secondary | ICD-10-CM | POA: Diagnosis not present

## 2024-07-12 DIAGNOSIS — N2581 Secondary hyperparathyroidism of renal origin: Secondary | ICD-10-CM | POA: Diagnosis not present

## 2024-07-12 DIAGNOSIS — E875 Hyperkalemia: Secondary | ICD-10-CM | POA: Diagnosis not present

## 2024-07-12 DIAGNOSIS — D689 Coagulation defect, unspecified: Secondary | ICD-10-CM | POA: Diagnosis not present

## 2024-07-12 DIAGNOSIS — Z992 Dependence on renal dialysis: Secondary | ICD-10-CM | POA: Diagnosis not present

## 2024-07-12 DIAGNOSIS — N186 End stage renal disease: Secondary | ICD-10-CM | POA: Diagnosis not present

## 2024-07-12 DIAGNOSIS — D509 Iron deficiency anemia, unspecified: Secondary | ICD-10-CM | POA: Diagnosis not present

## 2024-07-13 ENCOUNTER — Other Ambulatory Visit: Payer: Self-pay

## 2024-07-13 NOTE — Patient Instructions (Signed)
 Visit Information  Thank you for taking time to visit with me today. Please don't hesitate to contact me if I can be of assistance to you before our next scheduled appointment.  Your next care management appointment is by telephone on Thursday, August 14 at 11:30 AM  Please call the care guide team at [phone] if you need to cancel, schedule, or reschedule an appointment.   Please call [phone]-TALK (toll free, 24 hour hotline) if you are experiencing a Mental Health or Behavioral Health Crisis or need someone to talk to.  [name] RN BSN CCM [place]  [facility], [facility] Health Nurse Care Coordinator  Direct Dial : [phone] Website: [email or web address]

## 2024-07-13 NOTE — Patient Outreach (Signed)
 Complex Care Management   Visit Note  07/13/2024  Name:  Whitney [place] MRN: [id number] DOB: 12/02/1956  Situation: Referral received for Complex Care Management related to  Heart Failure, COPD, ESRD on dialysis, chronic right shoulder pain. I obtained verbal consent from Patient.  Visit completed with patient on the phone.  Background:   Past Medical History:  Diagnosis Date   Acute left-sided low back pain without sciatica 02/22/2024   Anemia    Anxiety    CHF (congestive heart failure) (HCC)    Colon cancer (HCC)    treatment surgery   Complication of anesthesia    after first C- [place] couldnt walk after, patient denies having a spinal   COPD (chronic obstructive pulmonary disease) (HCC)    Coronary artery disease    Depression    DVT (deep venous thrombosis) (HCC)    Encounter for annual health examination 08/05/2023   ESRD on hemodialysis (HCC)    Hemo: MWF   History of blood transfusion 04/2018   Hypertension    07/07/18- no longer takes BP medications   Left hip pain 05/16/2022   Left leg pain 05/09/2020   Leucocytosis 04/28/2012   Meningitis    Orthostatic hypotension 05/16/2022   Pain in limb 07/30/2013   PE (pulmonary embolism)    Peripheral vascular disease (HCC)    Primary hypertension 11/15/2013   Rash 11/25/2023   Restless legs    Shortness of breath    with exertion   Sleep apnea    SOB (shortness of breath) 03/03/2019   Vertigo     Assessment: Patient Reported Symptoms:  Cognitive Cognitive Status: Alert and oriented to person, place, and time Cognitive/Intellectual Conditions Management [RPT]: None reported or documented in medical history or problem list   Health Maintenance Behaviors: Annual physical exam, Healthy diet Health Facilitated by: Pain control, Healthy diet, Rest  Neurological Neurological Review of Symptoms: No symptoms reported    HEENT HEENT Symptoms Reported: No symptoms reported      Cardiovascular Cardiovascular  Symptoms Reported: No symptoms reported Does patient have uncontrolled Hypertension?: No Cardiovascular Management Strategies: Medication therapy, Routine screening, Diet modification, Fluid modification Cardiovascular Self-Management Outcome: 4 (good)  Respiratory Respiratory Symptoms Reported: No symptoms reported    Endocrine Endocrine Symptoms Reported: No symptoms reported Is patient diabetic?: Yes (Prediabetes) Is patient checking blood sugars at home?: No Endocrine Self-Management Outcome: 4 (good)  Gastrointestinal Gastrointestinal Symptoms Reported: No symptoms reported      Genitourinary Genitourinary Symptoms Reported: No symptoms reported Genitourinary Management Strategies: Fluid modification, Hemodialysis Hemodialysis Schedule: M, W, F Hemodialysis Last Treatment: 07/12/24 Genitourinary Self-Management Outcome: 4 (good)  Integumentary Integumentary Symptoms Reported: No symptoms reported    Musculoskeletal Musculoskelatal Symptoms Reviewed: Joint pain Additional Musculoskeletal Details: right shoulder pain Musculoskeletal Management Strategies: Adequate rest, Medication therapy, Routine screening Musculoskeletal Self-Management Outcome: 4 (good)      Psychosocial Psychosocial Symptoms Reported: No symptoms reported     Quality of Family Relationships: supportive      06/22/2024   12:44 PM  Depression screen PHQ 2/9  Decreased Interest 0  Down, Depressed, Hopeless 0  PHQ - 2 Score 0    There were no vitals filed for this visit.  Medications Reviewed Today     Reviewed by Morgan Clayborne CROME, RN (Registered Nurse) on 07/13/24 at 1230  Med List Status: <None>   Medication Order Taking? Sig Documenting Provider Last Dose Status Informant  acetaminophen  (TYLENOL ) 650 MG CR tablet [id number]  Take 1,950 mg by mouth  every 8 (eight) hours as needed for pain. [provider]  Active Self, Pharmacy Records  albuterol  (VENTOLIN  HFA) 108 [phone] Base) MCG/ACT inhaler  [id number]  Inhale 2 puffs into the lungs every 6 (six) hours as needed for wheezing or shortness of breath. Georgina Speaks, FNP  Active Self, Pharmacy Records  atorvastatin  (LIPITOR) 20 MG tablet [id number]  Take 1 tablet (20 mg total) by mouth daily.  Patient taking differently: Take 20 mg by mouth at bedtime.   Ladona Heinz, MD  Expired 06/29/24 2359 Self, Pharmacy Records  Biotin  5 MG CAPS [id number]  Take 5 mg by mouth daily at 6 (six) AM. [provider]  Active Self, Pharmacy Records  doxycycline  (ADOXA) 100 MG tablet [id number]  Take 100 mg by mouth 2 (two) times daily. Prescribed by Dr. Macel, nephrologist [provider]  Active   ELIQUIS  5 MG TABS tablet [id number]  TAKE 1 TABLET (5 MG TOTAL) BY MOUTH 2 (TWO) TIMES DAILY. Georgina Speaks, FNP  Active Self, Pharmacy Records  meclizine  (ANTIVERT ) 12.5 MG tablet [id number]  Take 1 tablet (12.5 mg total) by mouth 3 (three) times daily as needed for dizziness. Georgina Speaks, FNP  Active Self, Pharmacy Records           Med Note CARLEEN, [place]bina D   Wed May 12, 2024  8:38 AM) Has at home - hasn't used in a while.  midodrine  (PROAMATINE ) 5 MG tablet [id number]  Take 5 mg by mouth 2 (two) times daily as needed (hypotension). [provider]  Active Self, Pharmacy Records  multivitamin (RENA-VIT) TABS tablet [id number]  Take 1 tablet by mouth at bedtime. [provider]  Active Self, Pharmacy Records  omeprazole  (PRILOSEC) 20 MG capsule [id number]  TAKE 1 CAPSULE (20 MG TOTAL) BY MOUTH DAILY. Georgina Speaks, FNP  Active Self, Pharmacy Records  sevelamer  carbonate (RENVELA ) 800 MG tablet [id number]  Take 800 mg by mouth at bedtime. [provider]  Active Self, Pharmacy Records           Med Note CARLEEN, [place] D   Wed May 12, 2024  8:35 AM)    sodium zirconium cyclosilicate  (LOKELMA ) 10 g PACK packet [id number]  Take 10 g by mouth daily. [provider]  Active Self, Pharmacy Records  Tiotropium  Bromide-Olodaterol (STIOLTO RESPIMAT ) 2.5-2.5 MCG/ACT AERS [id number]  Inhale 2 puffs into the lungs daily. Hunsucker, Donnice SAUNDERS, MD  Active Self, Pharmacy Records  vitamin C  (ASCORBIC ACID ) 250 MG tablet [id number]  Take 250 mg by mouth daily. [provider]  Active Self, Pharmacy Records  vitamin E 200 UNIT capsule [id number]  Take 200 Units by mouth daily. [provider]  Active Self, Pharmacy Records  Med List Note Steffi Nian, CPhT 05/09/24 1149): Dialysis MWF            Recommendation:   Continue Current Plan of Care  Follow Up Plan:   Telephone follow up appointment date/time:  Thursday, August 14 at 11:30 AM  [name] RN BSN CCM [place]  [facility], [facility] Nurse Care Coordinator  Direct Dial : [phone] Website: [email or web address]

## 2024-07-14 DIAGNOSIS — D509 Iron deficiency anemia, unspecified: Secondary | ICD-10-CM | POA: Diagnosis not present

## 2024-07-14 DIAGNOSIS — E875 Hyperkalemia: Secondary | ICD-10-CM | POA: Diagnosis not present

## 2024-07-14 DIAGNOSIS — N2581 Secondary hyperparathyroidism of renal origin: Secondary | ICD-10-CM | POA: Diagnosis not present

## 2024-07-14 DIAGNOSIS — Z992 Dependence on renal dialysis: Secondary | ICD-10-CM | POA: Diagnosis not present

## 2024-07-14 DIAGNOSIS — D689 Coagulation defect, unspecified: Secondary | ICD-10-CM | POA: Diagnosis not present

## 2024-07-14 DIAGNOSIS — N186 End stage renal disease: Secondary | ICD-10-CM | POA: Diagnosis not present

## 2024-07-16 DIAGNOSIS — Z992 Dependence on renal dialysis: Secondary | ICD-10-CM | POA: Diagnosis not present

## 2024-07-16 DIAGNOSIS — N186 End stage renal disease: Secondary | ICD-10-CM | POA: Diagnosis not present

## 2024-07-16 DIAGNOSIS — D509 Iron deficiency anemia, unspecified: Secondary | ICD-10-CM | POA: Diagnosis not present

## 2024-07-16 DIAGNOSIS — E875 Hyperkalemia: Secondary | ICD-10-CM | POA: Diagnosis not present

## 2024-07-16 DIAGNOSIS — D689 Coagulation defect, unspecified: Secondary | ICD-10-CM | POA: Diagnosis not present

## 2024-07-16 DIAGNOSIS — N2581 Secondary hyperparathyroidism of renal origin: Secondary | ICD-10-CM | POA: Diagnosis not present

## 2024-07-19 DIAGNOSIS — D689 Coagulation defect, unspecified: Secondary | ICD-10-CM | POA: Diagnosis not present

## 2024-07-19 DIAGNOSIS — N2581 Secondary hyperparathyroidism of renal origin: Secondary | ICD-10-CM | POA: Diagnosis not present

## 2024-07-19 DIAGNOSIS — D509 Iron deficiency anemia, unspecified: Secondary | ICD-10-CM | POA: Diagnosis not present

## 2024-07-19 DIAGNOSIS — Z992 Dependence on renal dialysis: Secondary | ICD-10-CM | POA: Diagnosis not present

## 2024-07-19 DIAGNOSIS — E875 Hyperkalemia: Secondary | ICD-10-CM | POA: Diagnosis not present

## 2024-07-19 DIAGNOSIS — N186 End stage renal disease: Secondary | ICD-10-CM | POA: Diagnosis not present

## 2024-07-21 DIAGNOSIS — E875 Hyperkalemia: Secondary | ICD-10-CM | POA: Diagnosis not present

## 2024-07-21 DIAGNOSIS — Z992 Dependence on renal dialysis: Secondary | ICD-10-CM | POA: Diagnosis not present

## 2024-07-21 DIAGNOSIS — N186 End stage renal disease: Secondary | ICD-10-CM | POA: Diagnosis not present

## 2024-07-21 DIAGNOSIS — D689 Coagulation defect, unspecified: Secondary | ICD-10-CM | POA: Diagnosis not present

## 2024-07-21 DIAGNOSIS — D509 Iron deficiency anemia, unspecified: Secondary | ICD-10-CM | POA: Diagnosis not present

## 2024-07-21 DIAGNOSIS — N2581 Secondary hyperparathyroidism of renal origin: Secondary | ICD-10-CM | POA: Diagnosis not present

## 2024-07-23 DIAGNOSIS — D689 Coagulation defect, unspecified: Secondary | ICD-10-CM | POA: Diagnosis not present

## 2024-07-23 DIAGNOSIS — E875 Hyperkalemia: Secondary | ICD-10-CM | POA: Diagnosis not present

## 2024-07-23 DIAGNOSIS — Z992 Dependence on renal dialysis: Secondary | ICD-10-CM | POA: Diagnosis not present

## 2024-07-23 DIAGNOSIS — N186 End stage renal disease: Secondary | ICD-10-CM | POA: Diagnosis not present

## 2024-07-23 DIAGNOSIS — N2581 Secondary hyperparathyroidism of renal origin: Secondary | ICD-10-CM | POA: Diagnosis not present

## 2024-07-23 DIAGNOSIS — D509 Iron deficiency anemia, unspecified: Secondary | ICD-10-CM | POA: Diagnosis not present

## 2024-07-26 DIAGNOSIS — N2581 Secondary hyperparathyroidism of renal origin: Secondary | ICD-10-CM | POA: Diagnosis not present

## 2024-07-26 DIAGNOSIS — E875 Hyperkalemia: Secondary | ICD-10-CM | POA: Diagnosis not present

## 2024-07-26 DIAGNOSIS — Z992 Dependence on renal dialysis: Secondary | ICD-10-CM | POA: Diagnosis not present

## 2024-07-26 DIAGNOSIS — N186 End stage renal disease: Secondary | ICD-10-CM | POA: Diagnosis not present

## 2024-07-26 DIAGNOSIS — D509 Iron deficiency anemia, unspecified: Secondary | ICD-10-CM | POA: Diagnosis not present

## 2024-07-26 DIAGNOSIS — D689 Coagulation defect, unspecified: Secondary | ICD-10-CM | POA: Diagnosis not present

## 2024-07-28 DIAGNOSIS — E875 Hyperkalemia: Secondary | ICD-10-CM | POA: Diagnosis not present

## 2024-07-28 DIAGNOSIS — N2581 Secondary hyperparathyroidism of renal origin: Secondary | ICD-10-CM | POA: Diagnosis not present

## 2024-07-28 DIAGNOSIS — N186 End stage renal disease: Secondary | ICD-10-CM | POA: Diagnosis not present

## 2024-07-28 DIAGNOSIS — D689 Coagulation defect, unspecified: Secondary | ICD-10-CM | POA: Diagnosis not present

## 2024-07-28 DIAGNOSIS — D509 Iron deficiency anemia, unspecified: Secondary | ICD-10-CM | POA: Diagnosis not present

## 2024-07-28 DIAGNOSIS — Z992 Dependence on renal dialysis: Secondary | ICD-10-CM | POA: Diagnosis not present

## 2024-07-29 DIAGNOSIS — R531 Weakness: Secondary | ICD-10-CM | POA: Diagnosis not present

## 2024-07-29 DIAGNOSIS — Z992 Dependence on renal dialysis: Secondary | ICD-10-CM | POA: Diagnosis not present

## 2024-07-29 DIAGNOSIS — G4733 Obstructive sleep apnea (adult) (pediatric): Secondary | ICD-10-CM | POA: Diagnosis not present

## 2024-07-29 DIAGNOSIS — N186 End stage renal disease: Secondary | ICD-10-CM | POA: Diagnosis not present

## 2024-07-29 DIAGNOSIS — I509 Heart failure, unspecified: Secondary | ICD-10-CM | POA: Diagnosis not present

## 2024-07-29 DIAGNOSIS — I129 Hypertensive chronic kidney disease with stage 1 through stage 4 chronic kidney disease, or unspecified chronic kidney disease: Secondary | ICD-10-CM | POA: Diagnosis not present

## 2024-07-30 DIAGNOSIS — D509 Iron deficiency anemia, unspecified: Secondary | ICD-10-CM | POA: Diagnosis not present

## 2024-07-30 DIAGNOSIS — Z992 Dependence on renal dialysis: Secondary | ICD-10-CM | POA: Diagnosis not present

## 2024-07-30 DIAGNOSIS — D689 Coagulation defect, unspecified: Secondary | ICD-10-CM | POA: Diagnosis not present

## 2024-07-30 DIAGNOSIS — N186 End stage renal disease: Secondary | ICD-10-CM | POA: Diagnosis not present

## 2024-07-30 DIAGNOSIS — N2581 Secondary hyperparathyroidism of renal origin: Secondary | ICD-10-CM | POA: Diagnosis not present

## 2024-07-30 DIAGNOSIS — E875 Hyperkalemia: Secondary | ICD-10-CM | POA: Diagnosis not present

## 2024-08-04 ENCOUNTER — Other Ambulatory Visit: Payer: Self-pay | Admitting: Nurse Practitioner

## 2024-08-04 DIAGNOSIS — N2581 Secondary hyperparathyroidism of renal origin: Secondary | ICD-10-CM | POA: Diagnosis not present

## 2024-08-04 DIAGNOSIS — Z992 Dependence on renal dialysis: Secondary | ICD-10-CM | POA: Diagnosis not present

## 2024-08-04 DIAGNOSIS — D509 Iron deficiency anemia, unspecified: Secondary | ICD-10-CM | POA: Diagnosis not present

## 2024-08-09 DIAGNOSIS — E875 Hyperkalemia: Secondary | ICD-10-CM | POA: Diagnosis not present

## 2024-08-09 DIAGNOSIS — Z992 Dependence on renal dialysis: Secondary | ICD-10-CM | POA: Diagnosis not present

## 2024-08-09 DIAGNOSIS — D689 Coagulation defect, unspecified: Secondary | ICD-10-CM | POA: Diagnosis not present

## 2024-08-09 DIAGNOSIS — N186 End stage renal disease: Secondary | ICD-10-CM | POA: Diagnosis not present

## 2024-08-09 DIAGNOSIS — N2581 Secondary hyperparathyroidism of renal origin: Secondary | ICD-10-CM | POA: Diagnosis not present

## 2024-08-12 ENCOUNTER — Telehealth: Payer: Self-pay

## 2024-08-13 DIAGNOSIS — N2581 Secondary hyperparathyroidism of renal origin: Secondary | ICD-10-CM | POA: Diagnosis not present

## 2024-08-18 DIAGNOSIS — D689 Coagulation defect, unspecified: Secondary | ICD-10-CM | POA: Diagnosis not present

## 2024-08-18 DIAGNOSIS — N2581 Secondary hyperparathyroidism of renal origin: Secondary | ICD-10-CM | POA: Diagnosis not present

## 2024-08-18 DIAGNOSIS — Z992 Dependence on renal dialysis: Secondary | ICD-10-CM | POA: Diagnosis not present

## 2024-08-18 DIAGNOSIS — E875 Hyperkalemia: Secondary | ICD-10-CM | POA: Diagnosis not present

## 2024-08-18 DIAGNOSIS — N186 End stage renal disease: Secondary | ICD-10-CM | POA: Diagnosis not present

## 2024-08-18 DIAGNOSIS — D509 Iron deficiency anemia, unspecified: Secondary | ICD-10-CM | POA: Diagnosis not present

## 2024-08-20 DIAGNOSIS — Z992 Dependence on renal dialysis: Secondary | ICD-10-CM | POA: Diagnosis not present

## 2024-08-20 DIAGNOSIS — D689 Coagulation defect, unspecified: Secondary | ICD-10-CM | POA: Diagnosis not present

## 2024-08-20 DIAGNOSIS — N2581 Secondary hyperparathyroidism of renal origin: Secondary | ICD-10-CM | POA: Diagnosis not present

## 2024-08-20 DIAGNOSIS — D509 Iron deficiency anemia, unspecified: Secondary | ICD-10-CM | POA: Diagnosis not present

## 2024-08-20 DIAGNOSIS — N186 End stage renal disease: Secondary | ICD-10-CM | POA: Diagnosis not present

## 2024-08-23 DIAGNOSIS — N186 End stage renal disease: Secondary | ICD-10-CM | POA: Diagnosis not present

## 2024-08-23 DIAGNOSIS — Z992 Dependence on renal dialysis: Secondary | ICD-10-CM | POA: Diagnosis not present

## 2024-08-23 DIAGNOSIS — D689 Coagulation defect, unspecified: Secondary | ICD-10-CM | POA: Diagnosis not present

## 2024-08-23 DIAGNOSIS — E875 Hyperkalemia: Secondary | ICD-10-CM | POA: Diagnosis not present

## 2024-08-23 DIAGNOSIS — N2581 Secondary hyperparathyroidism of renal origin: Secondary | ICD-10-CM | POA: Diagnosis not present

## 2024-08-23 DIAGNOSIS — D509 Iron deficiency anemia, unspecified: Secondary | ICD-10-CM | POA: Diagnosis not present

## 2024-08-27 DIAGNOSIS — D509 Iron deficiency anemia, unspecified: Secondary | ICD-10-CM | POA: Diagnosis not present

## 2024-08-27 DIAGNOSIS — D689 Coagulation defect, unspecified: Secondary | ICD-10-CM | POA: Diagnosis not present

## 2024-08-27 DIAGNOSIS — N2581 Secondary hyperparathyroidism of renal origin: Secondary | ICD-10-CM | POA: Diagnosis not present

## 2024-08-27 DIAGNOSIS — Z992 Dependence on renal dialysis: Secondary | ICD-10-CM | POA: Diagnosis not present

## 2024-08-27 DIAGNOSIS — N186 End stage renal disease: Secondary | ICD-10-CM | POA: Diagnosis not present

## 2024-08-27 DIAGNOSIS — E875 Hyperkalemia: Secondary | ICD-10-CM | POA: Diagnosis not present

## 2024-08-29 DIAGNOSIS — N186 End stage renal disease: Secondary | ICD-10-CM | POA: Diagnosis not present

## 2024-08-29 DIAGNOSIS — R531 Weakness: Secondary | ICD-10-CM | POA: Diagnosis not present

## 2024-08-29 DIAGNOSIS — I509 Heart failure, unspecified: Secondary | ICD-10-CM | POA: Diagnosis not present

## 2024-08-29 DIAGNOSIS — Z992 Dependence on renal dialysis: Secondary | ICD-10-CM | POA: Diagnosis not present

## 2024-08-29 DIAGNOSIS — I129 Hypertensive chronic kidney disease with stage 1 through stage 4 chronic kidney disease, or unspecified chronic kidney disease: Secondary | ICD-10-CM | POA: Diagnosis not present

## 2024-08-29 DIAGNOSIS — G4733 Obstructive sleep apnea (adult) (pediatric): Secondary | ICD-10-CM | POA: Diagnosis not present

## 2024-08-30 DIAGNOSIS — Z992 Dependence on renal dialysis: Secondary | ICD-10-CM | POA: Diagnosis not present

## 2024-08-30 DIAGNOSIS — E875 Hyperkalemia: Secondary | ICD-10-CM | POA: Diagnosis not present

## 2024-08-30 DIAGNOSIS — D631 Anemia in chronic kidney disease: Secondary | ICD-10-CM | POA: Diagnosis not present

## 2024-08-30 DIAGNOSIS — R7881 Bacteremia: Secondary | ICD-10-CM | POA: Diagnosis not present

## 2024-08-30 DIAGNOSIS — N2581 Secondary hyperparathyroidism of renal origin: Secondary | ICD-10-CM | POA: Diagnosis not present

## 2024-08-30 DIAGNOSIS — D689 Coagulation defect, unspecified: Secondary | ICD-10-CM | POA: Diagnosis not present

## 2024-08-30 DIAGNOSIS — N186 End stage renal disease: Secondary | ICD-10-CM | POA: Diagnosis not present

## 2024-08-30 DIAGNOSIS — R52 Pain, unspecified: Secondary | ICD-10-CM | POA: Diagnosis not present

## 2024-08-30 DIAGNOSIS — D509 Iron deficiency anemia, unspecified: Secondary | ICD-10-CM | POA: Diagnosis not present

## 2024-09-01 DIAGNOSIS — N2581 Secondary hyperparathyroidism of renal origin: Secondary | ICD-10-CM | POA: Diagnosis not present

## 2024-09-01 DIAGNOSIS — N186 End stage renal disease: Secondary | ICD-10-CM | POA: Diagnosis not present

## 2024-09-01 DIAGNOSIS — R7881 Bacteremia: Secondary | ICD-10-CM | POA: Diagnosis not present

## 2024-09-01 DIAGNOSIS — D631 Anemia in chronic kidney disease: Secondary | ICD-10-CM | POA: Diagnosis not present

## 2024-09-01 DIAGNOSIS — Z992 Dependence on renal dialysis: Secondary | ICD-10-CM | POA: Diagnosis not present

## 2024-09-01 DIAGNOSIS — D689 Coagulation defect, unspecified: Secondary | ICD-10-CM | POA: Diagnosis not present

## 2024-09-01 DIAGNOSIS — R52 Pain, unspecified: Secondary | ICD-10-CM | POA: Diagnosis not present

## 2024-09-01 DIAGNOSIS — D509 Iron deficiency anemia, unspecified: Secondary | ICD-10-CM | POA: Diagnosis not present

## 2024-09-01 DIAGNOSIS — E875 Hyperkalemia: Secondary | ICD-10-CM | POA: Diagnosis not present

## 2024-09-03 DIAGNOSIS — D509 Iron deficiency anemia, unspecified: Secondary | ICD-10-CM | POA: Diagnosis not present

## 2024-09-03 DIAGNOSIS — R52 Pain, unspecified: Secondary | ICD-10-CM | POA: Diagnosis not present

## 2024-09-03 DIAGNOSIS — D631 Anemia in chronic kidney disease: Secondary | ICD-10-CM | POA: Diagnosis not present

## 2024-09-03 DIAGNOSIS — N2581 Secondary hyperparathyroidism of renal origin: Secondary | ICD-10-CM | POA: Diagnosis not present

## 2024-09-03 DIAGNOSIS — E875 Hyperkalemia: Secondary | ICD-10-CM | POA: Diagnosis not present

## 2024-09-03 DIAGNOSIS — N186 End stage renal disease: Secondary | ICD-10-CM | POA: Diagnosis not present

## 2024-09-03 DIAGNOSIS — Z992 Dependence on renal dialysis: Secondary | ICD-10-CM | POA: Diagnosis not present

## 2024-09-03 DIAGNOSIS — D689 Coagulation defect, unspecified: Secondary | ICD-10-CM | POA: Diagnosis not present

## 2024-09-03 DIAGNOSIS — R7881 Bacteremia: Secondary | ICD-10-CM | POA: Diagnosis not present

## 2024-09-06 DIAGNOSIS — D631 Anemia in chronic kidney disease: Secondary | ICD-10-CM | POA: Diagnosis not present

## 2024-09-06 DIAGNOSIS — R7881 Bacteremia: Secondary | ICD-10-CM | POA: Diagnosis not present

## 2024-09-06 DIAGNOSIS — N2581 Secondary hyperparathyroidism of renal origin: Secondary | ICD-10-CM | POA: Diagnosis not present

## 2024-09-06 DIAGNOSIS — N186 End stage renal disease: Secondary | ICD-10-CM | POA: Diagnosis not present

## 2024-09-06 DIAGNOSIS — Z992 Dependence on renal dialysis: Secondary | ICD-10-CM | POA: Diagnosis not present

## 2024-09-06 DIAGNOSIS — D689 Coagulation defect, unspecified: Secondary | ICD-10-CM | POA: Diagnosis not present

## 2024-09-06 DIAGNOSIS — R52 Pain, unspecified: Secondary | ICD-10-CM | POA: Diagnosis not present

## 2024-09-06 DIAGNOSIS — E875 Hyperkalemia: Secondary | ICD-10-CM | POA: Diagnosis not present

## 2024-09-06 DIAGNOSIS — D509 Iron deficiency anemia, unspecified: Secondary | ICD-10-CM | POA: Diagnosis not present

## 2024-09-08 DIAGNOSIS — D509 Iron deficiency anemia, unspecified: Secondary | ICD-10-CM | POA: Diagnosis not present

## 2024-09-08 DIAGNOSIS — D631 Anemia in chronic kidney disease: Secondary | ICD-10-CM | POA: Diagnosis not present

## 2024-09-08 DIAGNOSIS — D689 Coagulation defect, unspecified: Secondary | ICD-10-CM | POA: Diagnosis not present

## 2024-09-08 DIAGNOSIS — R52 Pain, unspecified: Secondary | ICD-10-CM | POA: Diagnosis not present

## 2024-09-08 DIAGNOSIS — R7881 Bacteremia: Secondary | ICD-10-CM | POA: Diagnosis not present

## 2024-09-08 DIAGNOSIS — N186 End stage renal disease: Secondary | ICD-10-CM | POA: Diagnosis not present

## 2024-09-08 DIAGNOSIS — N2581 Secondary hyperparathyroidism of renal origin: Secondary | ICD-10-CM | POA: Diagnosis not present

## 2024-09-08 DIAGNOSIS — E875 Hyperkalemia: Secondary | ICD-10-CM | POA: Diagnosis not present

## 2024-09-08 DIAGNOSIS — Z992 Dependence on renal dialysis: Secondary | ICD-10-CM | POA: Diagnosis not present

## 2024-09-10 DIAGNOSIS — D631 Anemia in chronic kidney disease: Secondary | ICD-10-CM | POA: Diagnosis not present

## 2024-09-10 DIAGNOSIS — N2581 Secondary hyperparathyroidism of renal origin: Secondary | ICD-10-CM | POA: Diagnosis not present

## 2024-09-10 DIAGNOSIS — E875 Hyperkalemia: Secondary | ICD-10-CM | POA: Diagnosis not present

## 2024-09-10 DIAGNOSIS — D509 Iron deficiency anemia, unspecified: Secondary | ICD-10-CM | POA: Diagnosis not present

## 2024-09-10 DIAGNOSIS — Z992 Dependence on renal dialysis: Secondary | ICD-10-CM | POA: Diagnosis not present

## 2024-09-10 DIAGNOSIS — N186 End stage renal disease: Secondary | ICD-10-CM | POA: Diagnosis not present

## 2024-09-10 DIAGNOSIS — D689 Coagulation defect, unspecified: Secondary | ICD-10-CM | POA: Diagnosis not present

## 2024-09-10 DIAGNOSIS — R7881 Bacteremia: Secondary | ICD-10-CM | POA: Diagnosis not present

## 2024-09-10 DIAGNOSIS — R52 Pain, unspecified: Secondary | ICD-10-CM | POA: Diagnosis not present

## 2024-09-13 ENCOUNTER — Other Ambulatory Visit: Payer: Self-pay | Admitting: Cardiology

## 2024-09-13 DIAGNOSIS — R7881 Bacteremia: Secondary | ICD-10-CM | POA: Diagnosis not present

## 2024-09-13 DIAGNOSIS — Z992 Dependence on renal dialysis: Secondary | ICD-10-CM | POA: Diagnosis not present

## 2024-09-13 DIAGNOSIS — D689 Coagulation defect, unspecified: Secondary | ICD-10-CM | POA: Diagnosis not present

## 2024-09-13 DIAGNOSIS — N2581 Secondary hyperparathyroidism of renal origin: Secondary | ICD-10-CM | POA: Diagnosis not present

## 2024-09-13 DIAGNOSIS — N186 End stage renal disease: Secondary | ICD-10-CM | POA: Diagnosis not present

## 2024-09-13 DIAGNOSIS — D631 Anemia in chronic kidney disease: Secondary | ICD-10-CM | POA: Diagnosis not present

## 2024-09-13 DIAGNOSIS — R52 Pain, unspecified: Secondary | ICD-10-CM | POA: Diagnosis not present

## 2024-09-13 DIAGNOSIS — D509 Iron deficiency anemia, unspecified: Secondary | ICD-10-CM | POA: Diagnosis not present

## 2024-09-13 DIAGNOSIS — I739 Peripheral vascular disease, unspecified: Secondary | ICD-10-CM

## 2024-09-13 DIAGNOSIS — E875 Hyperkalemia: Secondary | ICD-10-CM | POA: Diagnosis not present

## 2024-09-15 DIAGNOSIS — D509 Iron deficiency anemia, unspecified: Secondary | ICD-10-CM | POA: Diagnosis not present

## 2024-09-15 DIAGNOSIS — R52 Pain, unspecified: Secondary | ICD-10-CM | POA: Diagnosis not present

## 2024-09-15 DIAGNOSIS — D631 Anemia in chronic kidney disease: Secondary | ICD-10-CM | POA: Diagnosis not present

## 2024-09-15 DIAGNOSIS — N186 End stage renal disease: Secondary | ICD-10-CM | POA: Diagnosis not present

## 2024-09-15 DIAGNOSIS — R7881 Bacteremia: Secondary | ICD-10-CM | POA: Diagnosis not present

## 2024-09-15 DIAGNOSIS — Z992 Dependence on renal dialysis: Secondary | ICD-10-CM | POA: Diagnosis not present

## 2024-09-15 DIAGNOSIS — E875 Hyperkalemia: Secondary | ICD-10-CM | POA: Diagnosis not present

## 2024-09-15 DIAGNOSIS — D689 Coagulation defect, unspecified: Secondary | ICD-10-CM | POA: Diagnosis not present

## 2024-09-15 DIAGNOSIS — N2581 Secondary hyperparathyroidism of renal origin: Secondary | ICD-10-CM | POA: Diagnosis not present

## 2024-09-16 ENCOUNTER — Other Ambulatory Visit: Payer: Self-pay

## 2024-09-16 NOTE — Patient Outreach (Signed)
 Complex Care Management   Visit Note  09/16/2024  Name:  Whitney Walker MRN: [id number] DOB: 06-Apr-1956  Situation: Referral received for Complex Care Management related to  Heart Failure, COPD, OSA, ESRD on dialysis, chronic right shoulder pain. I obtained verbal consent from Patient.  Visit completed with Patient on the phone.  Background:   Past Medical History:  Diagnosis Date   Acute left-sided low back pain without sciatica 02/22/2024   Anemia    Anxiety    CHF (congestive heart failure) (HCC)    Colon cancer (HCC)    treatment surgery   Complication of anesthesia    after first [place]- [place] couldnt walk after, patient denies having a spinal   COPD (chronic obstructive pulmonary disease) (HCC)    Coronary artery disease    Depression    DVT (deep venous thrombosis) (HCC)    Encounter for annual health examination 08/05/2023   ESRD on hemodialysis (HCC)    Hemo: MWF   History of blood transfusion 04/2018   Hypertension    07/07/18- no longer takes BP medications   Left hip pain 05/16/2022   Left leg pain 05/09/2020   Leucocytosis 04/28/2012   Meningitis    Orthostatic hypotension 05/16/2022   Pain in limb 07/30/2013   PE (pulmonary embolism)    Peripheral vascular disease (HCC)    Primary hypertension 11/15/2013   Rash 11/25/2023   Restless legs    Shortness of breath    with exertion   Sleep apnea    SOB (shortness of breath) 03/03/2019   Vertigo     Assessment: Patient Reported Symptoms:  Cognitive Cognitive Status: Alert and oriented to person, place, and time, Normal speech and language skills Cognitive/Intellectual Conditions Management [RPT]: None reported or documented in medical history or problem list   Health Maintenance Behaviors: Annual physical exam, Healthy diet Health Facilitated by: Rest, Healthy diet  Neurological Neurological Review of Symptoms: Dizziness Neurological Management Strategies: Adequate rest, Routine  screening Neurological Self-Management Outcome: 4 (good)  HEENT HEENT Symptoms Reported: Tinnitus HEENT Management Strategies: Routine screening, Adequate rest HEENT Self-Management Outcome: 4 (good)    Cardiovascular Cardiovascular Symptoms Reported: Dizziness Does patient have uncontrolled Hypertension?: No Cardiovascular Management Strategies: Adequate rest, Routine screening Cardiovascular Self-Management Outcome: 3 (uncertain)  Respiratory Respiratory Symptoms Reported: Shortness of breath Respiratory Management Strategies: Routine screening (dialysis patient) Respiratory Self-Management Outcome: 4 (good)  Endocrine Endocrine Symptoms Reported: No symptoms reported Is patient diabetic?: No    Gastrointestinal Gastrointestinal Symptoms Reported: No symptoms reported      Genitourinary Genitourinary Symptoms Reported: No symptoms reported Genitourinary Management Strategies: Hemodialysis Hemodialysis Schedule: M, W, F Hemodialysis Last Treatment: 09/15/24 Genitourinary Self-Management Outcome: 4 (good)  Integumentary Integumentary Symptoms Reported: Itching Additional Integumentary Details: kidney doctor will change dialyzer to rule out reaction to dialyzer Skin Management Strategies: Routine screening Skin Self-Management Outcome: 3 (uncertain)  Musculoskeletal Musculoskelatal Symptoms Reviewed: Joint pain, Muscle pain, Back pain, Unsteady gait Additional Musculoskeletal Details: right arm and shoulder pain; using a rollator Musculoskeletal Management Strategies: Adequate rest, Routine screening, Medical device Musculoskeletal Self-Management Outcome: 4 (good) Falls in the past year?: Yes Number of falls in past year: 1 or less Was there an injury with Fall?: Yes Fall Risk Category Calculator: 2 Patient Fall Risk Level: Moderate Fall Risk Patient at Risk for Falls Due to: History of fall(s), Impaired balance/gait, Impaired mobility Fall risk Follow up: Falls evaluation  completed, Education provided, Falls prevention discussed  Psychosocial Psychosocial Symptoms Reported: No symptoms reported   Major  Change/Loss/Stressor/Fears (CP): Death of a loved one, Medical condition, family Techniques to [place] with Loss/Stress/Change: Diversional activities Quality of Family Relationships: supportive Do you feel physically threatened by others?: No    09/16/2024    PHQ2-9 Depression Screening   Sharonda Llamas interest or pleasure in doing things    Feeling down, depressed, or hopeless    PHQ-2 - Total Score    Trouble falling or staying asleep, or sleeping too much    Feeling tired or having Beniah Magnan energy    Poor appetite or overeating     Feeling bad about yourself - or that you are a failure or have let yourself or your family down    Trouble concentrating on things, such as reading the newspaper or watching television    Moving or speaking so slowly that other people could have noticed.  Or the opposite - being so fidgety or restless that you have been moving around a lot more than usual    Thoughts that you would be better off dead, or hurting yourself in some way    PHQ2-9 Total Score    If you checked off any problems, how difficult have these problems made it for you to do your work, take care of things at home, or get along with other people    Depression Interventions/Treatment      There were no vitals filed for this visit.  Medications Reviewed Today     Reviewed by Morgan Clayborne CROME, RN (Registered Nurse) on 09/16/24 at 1221  Med List Status: <None>   Medication Order Taking? Sig Documenting Provider Last Dose Status Informant  acetaminophen  (TYLENOL ) 650 MG CR tablet [id number]  Take 1,950 mg by mouth every 8 (eight) hours as needed for pain. [provider]  Active Self, Pharmacy Records  albuterol  (VENTOLIN  HFA) 108 [phone] Base) MCG/ACT inhaler [id number]  Inhale 2 puffs into the lungs every 6 (six) hours as needed for wheezing or shortness of breath.  Georgina Speaks, FNP  Active Self, Pharmacy Records  atorvastatin  (LIPITOR) 20 MG tablet [id number]  TAKE 1 TABLET (20 MG TOTAL) BY MOUTH DAILY. West, Katlyn D, NP  Active   Biotin  5 MG CAPS [id number]  Take 5 mg by mouth daily at 6 (six) AM. [provider]  Active Self, Pharmacy Records  doxycycline  (ADOXA) 100 MG tablet [id number]  Take 100 mg by mouth 2 (two) times daily. Prescribed by Dr. Macel, nephrologist [provider]  Active   ELIQUIS  5 MG TABS tablet [id number]  TAKE 1 TABLET (5 MG TOTAL) BY MOUTH 2 (TWO) TIMES DAILY. Georgina Speaks, FNP  Active   meclizine  (ANTIVERT ) 12.5 MG tablet [id number]  Take 1 tablet (12.5 mg total) by mouth 3 (three) times daily as needed for dizziness. Georgina Speaks, FNP  Active Self, Pharmacy Records           Med Note CARLEEN, [place] D   Wed May 12, 2024  8:38 AM) Has at home - hasn't used in a while.  midodrine  (PROAMATINE ) 5 MG tablet [id number]  Take 5 mg by mouth 2 (two) times daily as needed (hypotension). [provider]  Active Self, Pharmacy Records  multivitamin (RENA-VIT) TABS tablet [id number]  Take 1 tablet by mouth at bedtime. [provider]  Active Self, Pharmacy Records  omeprazole  (PRILOSEC) 20 MG capsule [id number]  TAKE 1 CAPSULE (20 MG TOTAL) BY MOUTH DAILY. Georgina Speaks, FNP  Active Self, Pharmacy Records  sevelamer  carbonate (RENVELA ) 800 MG tablet [id number]  Take 800 mg  by mouth at bedtime. [provider]  Active Self, Pharmacy Records           Med Note CARLEEN, [place] D   Wed May 12, 2024  8:35 AM)    sodium zirconium cyclosilicate  (LOKELMA ) 10 g PACK packet [id number]  Take 10 g by mouth daily. [provider]  Active Self, Pharmacy Records  Tiotropium Bromide-Olodaterol (STIOLTO RESPIMAT ) 2.5-2.5 MCG/ACT AERS [id number]  Inhale 2 puffs into the lungs daily. Hunsucker, Donnice SAUNDERS, MD  Active Self, Pharmacy Records  vitamin C  (ASCORBIC ACID ) 250 MG tablet [id number]  Take 250 mg by  mouth daily. [provider]  Active Self, Pharmacy Records  vitamin E 200 UNIT capsule [id number]  Take 200 Units by mouth daily. [provider]  Active Self, Pharmacy Records  Med List Note Steffi Nian, CPhT 05/09/24 1149): Dialysis MWF            Recommendation:   Continue Current Plan of Care  Follow Up Plan:   Telephone follow up appointment date/time:  Tuesday, October 14 at 11:30 AM  [name] RN BSN CCM [place]  [facility], [facility] Nurse Care Coordinator  Direct Dial : [phone] Website: [email or web address]

## 2024-09-16 NOTE — Patient Instructions (Signed)
 Visit Information  Thank you for taking time to visit with me today. Please don't hesitate to contact me if I can be of assistance to you before our next scheduled appointment.  Your next care management appointment is by telephone on Tuesday, October 14 at 11:30 AM  Please call the care guide team at [phone] if you need to cancel, schedule, or reschedule an appointment.   Please call 1-800-273-TALK (toll free, 24 hour hotline) if you are experiencing a Mental Health or Behavioral Health Crisis or need someone to talk to.  [name] RN BSN CCM   [facility], [facility] Health Nurse Care Coordinator  Direct Dial : [phone] Website: [email or web address]

## 2024-09-17 DIAGNOSIS — N186 End stage renal disease: Secondary | ICD-10-CM | POA: Diagnosis not present

## 2024-09-17 DIAGNOSIS — N2581 Secondary hyperparathyroidism of renal origin: Secondary | ICD-10-CM | POA: Diagnosis not present

## 2024-09-17 DIAGNOSIS — Z992 Dependence on renal dialysis: Secondary | ICD-10-CM | POA: Diagnosis not present

## 2024-09-17 DIAGNOSIS — E875 Hyperkalemia: Secondary | ICD-10-CM | POA: Diagnosis not present

## 2024-09-17 DIAGNOSIS — R52 Pain, unspecified: Secondary | ICD-10-CM | POA: Diagnosis not present

## 2024-09-17 DIAGNOSIS — D509 Iron deficiency anemia, unspecified: Secondary | ICD-10-CM | POA: Diagnosis not present

## 2024-09-17 DIAGNOSIS — D689 Coagulation defect, unspecified: Secondary | ICD-10-CM | POA: Diagnosis not present

## 2024-09-17 DIAGNOSIS — D631 Anemia in chronic kidney disease: Secondary | ICD-10-CM | POA: Diagnosis not present

## 2024-09-17 DIAGNOSIS — R7881 Bacteremia: Secondary | ICD-10-CM | POA: Diagnosis not present

## 2024-09-20 DIAGNOSIS — D509 Iron deficiency anemia, unspecified: Secondary | ICD-10-CM | POA: Diagnosis not present

## 2024-09-20 DIAGNOSIS — D631 Anemia in chronic kidney disease: Secondary | ICD-10-CM | POA: Diagnosis not present

## 2024-09-20 DIAGNOSIS — R52 Pain, unspecified: Secondary | ICD-10-CM | POA: Diagnosis not present

## 2024-09-20 DIAGNOSIS — D689 Coagulation defect, unspecified: Secondary | ICD-10-CM | POA: Diagnosis not present

## 2024-09-20 DIAGNOSIS — E875 Hyperkalemia: Secondary | ICD-10-CM | POA: Diagnosis not present

## 2024-09-20 DIAGNOSIS — N186 End stage renal disease: Secondary | ICD-10-CM | POA: Diagnosis not present

## 2024-09-20 DIAGNOSIS — N2581 Secondary hyperparathyroidism of renal origin: Secondary | ICD-10-CM | POA: Diagnosis not present

## 2024-09-20 DIAGNOSIS — R7881 Bacteremia: Secondary | ICD-10-CM | POA: Diagnosis not present

## 2024-09-20 DIAGNOSIS — Z992 Dependence on renal dialysis: Secondary | ICD-10-CM | POA: Diagnosis not present

## 2024-09-22 DIAGNOSIS — Z992 Dependence on renal dialysis: Secondary | ICD-10-CM | POA: Diagnosis not present

## 2024-09-22 DIAGNOSIS — D631 Anemia in chronic kidney disease: Secondary | ICD-10-CM | POA: Diagnosis not present

## 2024-09-22 DIAGNOSIS — E875 Hyperkalemia: Secondary | ICD-10-CM | POA: Diagnosis not present

## 2024-09-22 DIAGNOSIS — D509 Iron deficiency anemia, unspecified: Secondary | ICD-10-CM | POA: Diagnosis not present

## 2024-09-22 DIAGNOSIS — N2581 Secondary hyperparathyroidism of renal origin: Secondary | ICD-10-CM | POA: Diagnosis not present

## 2024-09-22 DIAGNOSIS — N186 End stage renal disease: Secondary | ICD-10-CM | POA: Diagnosis not present

## 2024-09-22 DIAGNOSIS — R7881 Bacteremia: Secondary | ICD-10-CM | POA: Diagnosis not present

## 2024-09-22 DIAGNOSIS — D689 Coagulation defect, unspecified: Secondary | ICD-10-CM | POA: Diagnosis not present

## 2024-09-22 DIAGNOSIS — R52 Pain, unspecified: Secondary | ICD-10-CM | POA: Diagnosis not present

## 2024-09-24 DIAGNOSIS — D509 Iron deficiency anemia, unspecified: Secondary | ICD-10-CM | POA: Diagnosis not present

## 2024-09-24 DIAGNOSIS — Z992 Dependence on renal dialysis: Secondary | ICD-10-CM | POA: Diagnosis not present

## 2024-09-24 DIAGNOSIS — E875 Hyperkalemia: Secondary | ICD-10-CM | POA: Diagnosis not present

## 2024-09-24 DIAGNOSIS — R7881 Bacteremia: Secondary | ICD-10-CM | POA: Diagnosis not present

## 2024-09-24 DIAGNOSIS — R52 Pain, unspecified: Secondary | ICD-10-CM | POA: Diagnosis not present

## 2024-09-24 DIAGNOSIS — N2581 Secondary hyperparathyroidism of renal origin: Secondary | ICD-10-CM | POA: Diagnosis not present

## 2024-09-24 DIAGNOSIS — D631 Anemia in chronic kidney disease: Secondary | ICD-10-CM | POA: Diagnosis not present

## 2024-09-24 DIAGNOSIS — D689 Coagulation defect, unspecified: Secondary | ICD-10-CM | POA: Diagnosis not present

## 2024-09-24 DIAGNOSIS — N186 End stage renal disease: Secondary | ICD-10-CM | POA: Diagnosis not present

## 2024-09-27 DIAGNOSIS — R52 Pain, unspecified: Secondary | ICD-10-CM | POA: Diagnosis not present

## 2024-09-27 DIAGNOSIS — Z992 Dependence on renal dialysis: Secondary | ICD-10-CM | POA: Diagnosis not present

## 2024-09-27 DIAGNOSIS — N186 End stage renal disease: Secondary | ICD-10-CM | POA: Diagnosis not present

## 2024-09-27 DIAGNOSIS — D509 Iron deficiency anemia, unspecified: Secondary | ICD-10-CM | POA: Diagnosis not present

## 2024-09-27 DIAGNOSIS — E875 Hyperkalemia: Secondary | ICD-10-CM | POA: Diagnosis not present

## 2024-09-27 DIAGNOSIS — N2581 Secondary hyperparathyroidism of renal origin: Secondary | ICD-10-CM | POA: Diagnosis not present

## 2024-09-27 DIAGNOSIS — D631 Anemia in chronic kidney disease: Secondary | ICD-10-CM | POA: Diagnosis not present

## 2024-09-27 DIAGNOSIS — D689 Coagulation defect, unspecified: Secondary | ICD-10-CM | POA: Diagnosis not present

## 2024-09-27 DIAGNOSIS — R7881 Bacteremia: Secondary | ICD-10-CM | POA: Diagnosis not present

## 2024-09-28 DIAGNOSIS — N186 End stage renal disease: Secondary | ICD-10-CM | POA: Diagnosis not present

## 2024-09-28 DIAGNOSIS — I129 Hypertensive chronic kidney disease with stage 1 through stage 4 chronic kidney disease, or unspecified chronic kidney disease: Secondary | ICD-10-CM | POA: Diagnosis not present

## 2024-09-28 DIAGNOSIS — G4733 Obstructive sleep apnea (adult) (pediatric): Secondary | ICD-10-CM | POA: Diagnosis not present

## 2024-09-28 DIAGNOSIS — I509 Heart failure, unspecified: Secondary | ICD-10-CM | POA: Diagnosis not present

## 2024-09-28 DIAGNOSIS — Z992 Dependence on renal dialysis: Secondary | ICD-10-CM | POA: Diagnosis not present

## 2024-09-28 DIAGNOSIS — R531 Weakness: Secondary | ICD-10-CM | POA: Diagnosis not present

## 2024-10-01 DIAGNOSIS — N186 End stage renal disease: Secondary | ICD-10-CM | POA: Diagnosis not present

## 2024-10-01 DIAGNOSIS — Z992 Dependence on renal dialysis: Secondary | ICD-10-CM | POA: Diagnosis not present

## 2024-10-12 ENCOUNTER — Other Ambulatory Visit: Payer: Self-pay

## 2024-10-12 NOTE — Patient Instructions (Signed)
 Visit Information  Thank you for taking time to visit with me today. Please don't hesitate to contact me if I can be of assistance to you before our next scheduled appointment.  Your next care management appointment is by telephone on Tuesday, November 11 at 11:30 AM  Please call the care guide team at [phone] if you need to cancel, schedule, or reschedule an appointment.   Please call 1-800-273-TALK (toll free, 24 hour hotline) if you are experiencing a Mental Health or Behavioral Health Crisis or need someone to talk to.  [name] RN BSN CCM [place]  [facility], [facility] Health Nurse Care Coordinator  Direct Dial : [phone] Website: [email or web address]

## 2024-10-12 NOTE — Patient Outreach (Signed)
 Complex Care Management   Visit Note  10/12/2024  Name:  Whitney Walker MRN: [id number] DOB: February 16, 1956  Situation: Referral received for Complex Care Management related to Heart Failure, COPD, ESRD on dialysis, chronic right shoulder pain, chronic right hip pain. I obtained verbal consent from Patient.  Visit completed with Patient on the phone.  Background:   Past Medical History:  Diagnosis Date   Acute left-sided low back pain without sciatica 02/22/2024   Anemia    Anxiety    CHF (congestive heart failure) (HCC)    Colon cancer (HCC)    treatment surgery   Complication of anesthesia    after first [place]- [place] couldnt walk after, patient denies having a spinal   COPD (chronic obstructive pulmonary disease) (HCC)    Coronary artery disease    Depression    DVT (deep venous thrombosis) (HCC)    Encounter for annual health examination 08/05/2023   ESRD on hemodialysis (HCC)    Hemo: MWF   History of blood transfusion 04/2018   Hypertension    07/07/18- no longer takes BP medications   Left hip pain 05/16/2022   Left leg pain 05/09/2020   Leucocytosis 04/28/2012   Meningitis    Orthostatic hypotension 05/16/2022   Pain in limb 07/30/2013   PE (pulmonary embolism)    Peripheral vascular disease    Primary hypertension 11/15/2013   Rash 11/25/2023   Restless legs    Shortness of breath    with exertion   Sleep apnea    SOB (shortness of breath) 03/03/2019   Vertigo     Assessment: Patient Reported Symptoms:  Cognitive Cognitive Status: Alert and oriented to person, place, and time, Normal speech and language skills Cognitive/Intellectual Conditions Management [RPT]: None reported or documented in medical history or problem list   Health Maintenance Behaviors: Annual physical exam, Healthy diet Health Facilitated by: Rest, Healthy diet  Neurological Neurological Review of Symptoms: No symptoms reported    HEENT HEENT Symptoms Reported: No symptoms reported       Cardiovascular Cardiovascular Symptoms Reported: No symptoms reported Does patient have uncontrolled Hypertension?: No Cardiovascular Management Strategies: Medication therapy, Routine screening, Fluid modification Cardiovascular Self-Management Outcome: 4 (good)  Respiratory Respiratory Symptoms Reported: Shortness of breath Respiratory Management Strategies: Routine screening, Medication therapy Respiratory Self-Management Outcome: 4 (good)  Endocrine Endocrine Symptoms Reported: No symptoms reported Is patient diabetic?: Yes Is patient checking blood sugars at home?: No Endocrine Self-Management Outcome: 4 (good)  Gastrointestinal Gastrointestinal Symptoms Reported: No symptoms reported      Genitourinary Genitourinary Symptoms Reported: No symptoms reported Genitourinary Management Strategies: Hemodialysis Hemodialysis Schedule: M, W and Friday Hemodialysis Last Treatment: 10/11/24 Genitourinary Self-Management Outcome: 4 (good)  Integumentary Integumentary Symptoms Reported: No symptoms reported    Musculoskeletal Musculoskelatal Symptoms Reviewed: Joint pain, Limited mobility, Difficulty walking, Unsteady gait Additional Musculoskeletal Details: chronic right hip and right shoulder pain Musculoskeletal Management Strategies: Routine screening, Medication therapy, Adequate rest Musculoskeletal Self-Management Outcome: 3 (uncertain) Musculoskeletal Comment: Educated patient about [facility]      Psychosocial Psychosocial Symptoms Reported: No symptoms reported   Major Change/Loss/Stressor/Fears (CP): Death of a loved one Techniques to [place] with Loss/Stress/Change: Diversional activities Quality of Family Relationships: helpful, supportive, involved Do you feel physically threatened by others?: No    10/12/2024    PHQ2-9 Depression Screening   Whitney Walker interest or pleasure in doing things    Feeling down, depressed, or hopeless    PHQ-2 - Total Score    Trouble  falling or staying  asleep, or sleeping too much    Feeling tired or having Whitney Walker energy    Poor appetite or overeating     Feeling bad about yourself - or that you are a failure or have let yourself or your family down    Trouble concentrating on things, such as reading the newspaper or watching television    Moving or speaking so slowly that other people could have noticed.  Or the opposite - being so fidgety or restless that you have been moving around a lot more than usual    Thoughts that you would be better off dead, or hurting yourself in some way    PHQ2-9 Total Score    If you checked off any problems, how difficult have these problems made it for you to do your work, take care of things at home, or get along with other people    Depression Interventions/Treatment      There were no vitals filed for this visit.  Medications Reviewed Today     Reviewed by Morgan Clayborne CROME, RN (Registered Nurse) on 10/12/24 at 1148  Med List Status: <None>   Medication Order Taking? Sig Documenting Provider Last Dose Status Informant  acetaminophen  (TYLENOL ) 650 MG CR tablet [id number]  Take 1,950 mg by mouth every 8 (eight) hours as needed for pain. [provider]  Active Self, Pharmacy Records  albuterol  (VENTOLIN  HFA) 108 (90 Base) MCG/ACT inhaler [id number]  Inhale 2 puffs into the lungs every 6 (six) hours as needed for wheezing or shortness of breath. Georgina Speaks, FNP  Active Self, Pharmacy Records  atorvastatin  (LIPITOR) 20 MG tablet [id number]  TAKE 1 TABLET (20 MG TOTAL) BY MOUTH DAILY. West, Katlyn D, NP  Active   Biotin  5 MG CAPS [id number]  Take 5 mg by mouth daily at 6 (six) AM. [provider]  Active Self, Pharmacy Records  doxycycline  (ADOXA) 100 MG tablet [id number]  Take 100 mg by mouth 2 (two) times daily. Prescribed by Dr. Macel, nephrologist [provider]  Active   ELIQUIS  5 MG TABS tablet [id number]  TAKE 1 TABLET (5 MG TOTAL) BY MOUTH 2 (TWO) TIMES  DAILY. Georgina Speaks, FNP  Active   meclizine  (ANTIVERT ) 12.5 MG tablet [id number]  Take 1 tablet (12.5 mg total) by mouth 3 (three) times daily as needed for dizziness. Georgina Speaks, FNP  Active Self, Pharmacy Records           Med Note CARLEEN, [place] D   Wed May 12, 2024  8:38 AM) Has at home - hasn't used in a while.  midodrine  (PROAMATINE ) 5 MG tablet [id number]  Take 5 mg by mouth 2 (two) times daily as needed (hypotension). [provider]  Active Self, Pharmacy Records  multivitamin (RENA-VIT) TABS tablet [id number]  Take 1 tablet by mouth at bedtime. [provider]  Active Self, Pharmacy Records  omeprazole  (PRILOSEC) 20 MG capsule [id number]  TAKE 1 CAPSULE (20 MG TOTAL) BY MOUTH DAILY. Georgina Speaks, FNP  Active Self, Pharmacy Records  sevelamer  carbonate (RENVELA ) 800 MG tablet [id number]  Take 800 mg by mouth at bedtime. [provider]  Active Self, Pharmacy Records           Med Note CARLEEN, [place] D   Wed May 12, 2024  8:35 AM)    sodium zirconium cyclosilicate  (LOKELMA ) 10 g PACK packet [id number]  Take 10 g by mouth daily. [provider]  Active Self, Pharmacy Records  Tiotropium Bromide-Olodaterol (STIOLTO RESPIMAT ) 2.5-2.5 MCG/ACT AERS  [id number]  Inhale 2 puffs into the lungs daily. Hunsucker, Donnice SAUNDERS, MD  Active Self, Pharmacy Records  vitamin C  (ASCORBIC ACID ) 250 MG tablet [id number]  Take 250 mg by mouth daily. [provider]  Active Self, Pharmacy Records  vitamin E 200 UNIT capsule [id number]  Take 200 Units by mouth daily. [provider]  Active Self, Pharmacy Records  Med List Note Steffi Nian, CPhT 05/09/24 1149): Dialysis MWF            Recommendation:   Specialty provider follow-up on 12/02/24 at 11:00 PM with Ladona Heinz, MDDepartment:[facility] AT [facility]  PCP follow-up with [name] FNP on 12/14/24 at 12:00 PM at [facility]   Follow Up Plan:   Telephone  follow up appointment date/time:  Tuesday, November 11 at 11:30 AM  [name] RN BSN CCM [place]  [facility], [facility] Nurse Care Coordinator  Direct Dial : [phone] Website: [email or web address]

## 2024-10-29 DIAGNOSIS — Z992 Dependence on renal dialysis: Secondary | ICD-10-CM | POA: Diagnosis not present

## 2024-10-29 DIAGNOSIS — I509 Heart failure, unspecified: Secondary | ICD-10-CM | POA: Diagnosis not present

## 2024-10-29 DIAGNOSIS — R531 Weakness: Secondary | ICD-10-CM | POA: Diagnosis not present

## 2024-10-29 DIAGNOSIS — N186 End stage renal disease: Secondary | ICD-10-CM | POA: Diagnosis not present

## 2024-10-29 DIAGNOSIS — G4733 Obstructive sleep apnea (adult) (pediatric): Secondary | ICD-10-CM | POA: Diagnosis not present

## 2024-11-09 ENCOUNTER — Telehealth

## 2024-11-16 ENCOUNTER — Other Ambulatory Visit: Payer: Self-pay | Admitting: Nurse Practitioner

## 2024-11-16 ENCOUNTER — Other Ambulatory Visit: Payer: Self-pay | Admitting: Cardiology

## 2024-11-16 DIAGNOSIS — I739 Peripheral vascular disease, unspecified: Secondary | ICD-10-CM

## 2024-12-02 ENCOUNTER — Encounter: Payer: Self-pay | Admitting: Cardiology

## 2024-12-02 ENCOUNTER — Ambulatory Visit: Attending: Cardiology | Admitting: Cardiology

## 2024-12-02 VITALS — BP 108/68 | HR 108 | Resp 16 | Ht 61.0 in | Wt 202.9 lb

## 2024-12-02 DIAGNOSIS — I739 Peripheral vascular disease, unspecified: Secondary | ICD-10-CM

## 2024-12-02 DIAGNOSIS — E7800 Pure hypercholesterolemia, unspecified: Secondary | ICD-10-CM

## 2024-12-02 DIAGNOSIS — I350 Nonrheumatic aortic (valve) stenosis: Secondary | ICD-10-CM

## 2024-12-02 DIAGNOSIS — I951 Orthostatic hypotension: Secondary | ICD-10-CM

## 2024-12-02 MED ORDER — MIDODRINE HCL 5 MG PO TABS: 5.0000 mg | ORAL_TABLET | Freq: Two times a day (BID) | ORAL | 0 refills | Status: AC | PRN

## 2024-12-02 MED ORDER — EZETIMIBE 10 MG PO TABS: 10.0000 mg | ORAL_TABLET | Freq: Every day | ORAL | 0 refills | Status: DC

## 2024-12-02 NOTE — Progress Notes (Signed)
 Cardiology Office Note:  .   Date:  12/02/2024  ID:  Whitney Walker, DOB 17-Jul-1956, MRN [id number] PCP: Georgina Speaks, FNP  [facility] Providers Cardiologist:  Gordy Bergamo, MD   History of Present Illness: .   Whitney Walker is a 68 y.o. African-American female with morbid obesity, hypertension, hyperlipidemia, tobacco use, obstructive sleep apnea not on CPAP, COPD and severe peripheral arterial disease with history of left femoral-popliteal bypass surgery in 2014, on chronic anticoagulation with Eliquis  due to recurrent DVT and PE in 2014 and 2016, ESRD on hemodialysis on Monday/Wednesday/Friday. Since to the point of ESRD, patient has developed severe orthostatic hypotension.     Discussed the use of AI scribe software for clinical note transcription with the patient, who gave verbal consent to proceed.  History of Present Illness Whitney Walker is a 68 year old female with peripheral arterial disease and high cholesterol who presents for cardiovascular follow-up.  She has peripheral arterial disease with prior left femoral popliteal bypass and now has pain in two toes. Smoking cessation is a goal, and she is using a non-nicotine vapor device.  She recently fell and broke her shoulder and ankle. The shoulder pain limits her mobility and ability to walk for exercise.  She takes atorvastatin  20 mg for high cholesterol. Prior headaches improved when she began taking it at night. She is interested in alternatives but is currently using prescribed therapy.  She feels dizzy when bending over or standing up and during dialysis. She has midodrine  but has not been taking it regularly.  Her current medications include atorvastatin , Eliquis , vitamins, a new cholesterol medication, and Sovelvonir with meals for phosphorus control, which has been stable.  Cardiac Studies relevent.    ECHOCARDIOGRAM COMPLETE 05/20/2024  1. Left ventricular ejection fraction, by estimation,  is 55 to 60%. The left ventricle has normal function. The left ventricle has no regional wall motion abnormalities. There is moderate left ventricular hypertrophy. Left ventricular diastolic parameters are consistent with Grade I diastolic dysfunction (impaired relaxation). 2. Right ventricular systolic function is normal. The right ventricular size is normal. There is normal pulmonary artery systolic pressure. The estimated right ventricular systolic pressure is 27.0 mmHg. 3. The mitral valve is normal in structure. Trivial mitral valve regurgitation. Moderate mitral annular calcification. 4. The aortic valve is calcified. There is severe calcifcation of the aortic valve. Aortic valve regurgitation is mild. Mild aortic valve stenosis. Aortic valve area, by VTI measures 1.84 cm. Aortic valve mean gradient measures 12.0 mmHg. Aortic valve Vmax measures 2.32 m/s.  Left Heart Catheterization 05/02/22:      Labs   Lab Results  Component Value Date   CHOL 171 07/29/2023   HDL 48 07/29/2023   LDLCALC 86 07/29/2023   TRIG 223 (H) 07/29/2023   CHOLHDL 3.6 07/29/2023   Lipoprotein (a)  Date/Time Value Ref Range Status  05/03/2022 01:18 AM 67.0 (H) <75.0 nmol/L Final    Comment:    (NOTE) Note:  Values greater than or equal to 75.0 nmol/L may       indicate an independent risk factor for CHD,       but must be evaluated with caution when applied       to non-Caucasian populations due to the       influence of genetic factors on Lp(a) across       ethnicities. Performed At: [facility] [address] [place], [place] [id number] Jennette Shorter MD Ey:[phone]     Recent  Labs    05/19/24 1019 05/20/24 0616 06/22/24 1222  NA 133* 132* 137  K 4.0 4.8 4.6  CL 96* 94* 97*  CO2 27 24 28   GLUCOSE 140* 165* 95  BUN 15 30* 32*  CREATININE 4.59* 7.11* 8.16*  CALCIUM  7.5* 8.0* 9.2  GFRNONAA 10* 6* 5*    Lab Results  Component Value Date   ALT 11 06/22/2024   AST 15  06/22/2024   ALKPHOS 127 (H) 06/22/2024   BILITOT 0.4 06/22/2024      Latest Ref Rng & Units 06/22/2024   12:22 PM 05/20/2024    6:16 AM 05/19/2024   10:19 AM  CBC  WBC 4.0 - 10.5 K/uL 9.8  14.7  14.3   Hemoglobin 12.0 - 15.0 g/dL 88.0  88.1  87.4   Hematocrit 36.0 - 46.0 % 37.4  37.9  39.9   Platelets 150 - 400 K/uL 179  215  205    Lab Results  Component Value Date   HGBA1C 6.0 (H) 06/15/2024    Lab Results  Component Value Date   TSH 1.57 06/04/2018   ROS  Review of Systems  Cardiovascular:  Positive for dyspnea on exertion (chronic). Negative for chest pain and leg swelling.  Respiratory:  Positive for cough.    Physical Exam:   VS:  BP 108/68 (BP Location: Left Arm, Patient Position: Sitting, Cuff Size: Large)   Pulse (!) 108   Resp 16   Ht 5' 1 (1.549 m)   Wt 202 lb 14.4 oz (92 kg)   SpO2 95%   BMI 38.34 kg/m    Wt Readings from Last 3 Encounters:  12/02/24 202 lb 14.4 oz (92 kg)  06/29/24 200 lb 6.4 oz (90.9 kg)  06/22/24 204 lb 1.6 oz (92.6 kg)    BP Readings from Last 3 Encounters:  12/02/24 108/68  06/29/24 118/78  06/22/24 114/74   Physical Exam Constitutional:      Appearance: She is morbidly obese.  Neck:     Vascular: No carotid bruit or JVD.  Cardiovascular:     Rate and Rhythm: Normal rate and regular rhythm.     Pulses: Intact distal pulses.          Dorsalis pedis pulses are 0 on the right side and 2+ on the left side.       Posterior tibial pulses are 0 on the right side and 2+ on the left side.     Heart sounds: Normal heart sounds. No murmur heard.    No gallop.  Pulmonary:     Effort: Pulmonary effort is normal.     Breath sounds: Decreased air movement present. Rales (scattered diffuse) present.  Abdominal:     General: Bowel sounds are normal.     Palpations: Abdomen is soft.  Musculoskeletal:     Right lower leg: No edema.     Left lower leg: No edema.    EKG:         ASSESSMENT AND PLAN: .      ICD-10-CM   1. PVD  (peripheral vascular disease) with claudication  I73.9     2. Mild aortic stenosis  I35.0     3. Hypercholesterolemia  E78.00 ezetimibe  (ZETIA ) 10 MG tablet    Lipid Panel With LDL/HDL Ratio    4. Orthostatic hypotension  I95.1 midodrine  (PROAMATINE ) 5 MG tablet     Assessment & Plan Peripheral arterial disease with claudication and prior left femoral-popliteal bypass Chronic peripheral arterial disease with  prior left femoral-popliteal bypass. Persistent blockages on the right side. Smoking cessation is crucial to prevent further complications and potential limb loss. - Encouraged smoking cessation - Advised increased walking to improve circulation - Presently remains asymptomatic with regard to claudication symptoms, she is markedly sedentary.  Capillary refill time <2 seconds on the left and right lower extremity.  Mild aortic stenosis Echocardiogram from May 2025 shows mild aortic valve stenosis with normal heart function. No immediate intervention required.  Hypercholesterolemia LDL cholesterol level is 86 mg/dL, above the target of less than 70 mg/dL. Previous increase in atorvastatin  dose caused headaches. Decision to add Zetia  to atorvastatin  20 mg to achieve cholesterol goals without increasing statin dose. - Continue atorvastatin  20 mg daily - Added Zetia  to atorvastatin  regimen - Ordered cholesterol panel today for baseline - Follow up with Romero Ada, NP in a couple of months to check cholesterol levels  Orthostatic hypotension Episodes of dizziness upon standing, particularly during dialysis. Previously managed with midodrine , but not currently in use. Plan to reintroduce midodrine  to manage symptoms during dialysis. - Prescribed midodrine  for use before dialysis - Instructed to take midodrine  before dialysis and have it available during the session - Provided prescription for midodrine    Follow up: I will see her back on a as needed basis and patient to follow-up  with her PCP for further management and prescription refills.  Signed,  Gordy Bergamo, MD, [facility] 12/02/2024, 5:48 PM [facility] [address] [place], [place] [address] Phone: [phone]. Fax:  [phone]

## 2024-12-02 NOTE — Patient Instructions (Signed)
 Medication Instructions:  Your physician recommends that you continue on your current medications as directed. Please refer to the Current Medication list given to you today.  *If you need a refill on your cardiac medications before your next appointment, please call your pharmacy*  Lab Work: Please complete your FASTING lipid panel in our first floor lab today before you leave.  If you have labs (blood work) drawn today and your tests are completely normal, you will receive your results only by: MyChart Message (if you have MyChart) OR A paper copy in the mail If you have any lab test that is abnormal or we need to change your treatment, we will call you to review the results.  Testing/Procedures: None.  Follow-Up: At [facility], you and your health needs are our priority.  As part of our continuing mission to provide you with exceptional heart care, our providers are all part of one team.  This team includes your primary Cardiologist (physician) and Advanced Practice Providers or APPs (Physician Assistants and Nurse Practitioners) who all work together to provide you with the care you need, when you need it.  Your next appointment will be as needed and it will be with:     Provider:   Gordy Bergamo, MD

## 2024-12-07 ENCOUNTER — Other Ambulatory Visit: Payer: Self-pay

## 2024-12-07 DIAGNOSIS — I5032 Chronic diastolic (congestive) heart failure: Secondary | ICD-10-CM

## 2024-12-07 DIAGNOSIS — I70219 Atherosclerosis of native arteries of extremities with intermittent claudication, unspecified extremity: Secondary | ICD-10-CM

## 2024-12-07 DIAGNOSIS — N186 End stage renal disease: Secondary | ICD-10-CM

## 2024-12-07 NOTE — Patient Instructions (Signed)
 Visit Information  Thank you for taking time to visit with me today. Please don't hesitate to contact me if I can be of assistance to you before our next scheduled appointment.  Your next care management appointment is by telephone on Tuesday, January 6 at 11:00 AM  Please call the care guide team at [phone] if you need to cancel, schedule, or reschedule an appointment.   Please call 1-800-273-TALK (toll free, 24 hour hotline) if you are experiencing a Mental Health or Behavioral Health Crisis or need someone to talk to.  [name] RN BSN CCM [place]  [facility], [facility] Health Nurse Care Coordinator  Direct Dial : [phone] Website: [email or web address]

## 2024-12-07 NOTE — Patient Outreach (Signed)
 Complex Care Management   Visit Note  12/07/2024  Name:  Whitney Walker MRN: [id number] DOB: 1956-08-16  Situation: Referral received for Complex Care Management related to Heart Failure, COPD, ESRD on dialysis, chronic right shoulder pain, chronic right hip pain. I obtained verbal consent from Patient.  Visit completed with Patient on the phone.  Background:  . Past Medical History:  Diagnosis Date   Acute left-sided low back pain without sciatica 02/22/2024   Anemia    Anxiety    CHF (congestive heart failure) (HCC)    Colon cancer (HCC)    treatment surgery   Complication of anesthesia    after first C- [place] couldnt walk after, patient denies having a spinal   COPD (chronic obstructive pulmonary disease) (HCC)    Coronary artery disease    Depression    DVT (deep venous thrombosis) (HCC)    Encounter for annual health examination 08/05/2023   ESRD on hemodialysis (HCC)    Hemo: MWF   History of blood transfusion 04/2018   Hypertension    07/07/18- no longer takes BP medications   Left hip pain 05/16/2022   Left leg pain 05/09/2020   Leucocytosis 04/28/2012   Meningitis    Orthostatic hypotension 05/16/2022   Pain in limb 07/30/2013   PE (pulmonary embolism)    Peripheral vascular disease    Primary hypertension 11/15/2013   Rash 11/25/2023   Restless legs    Shortness of breath    with exertion   Sleep apnea    SOB (shortness of breath) 03/03/2019   Vertigo     Assessment: Patient Reported Symptoms:  Cognitive Cognitive Status: Alert and oriented to person, place, and time, Normal speech and language skills Cognitive/Intellectual Conditions Management [RPT]: None reported or documented in medical history or problem list   Health Maintenance Behaviors: Annual physical exam, Healthy diet Health Facilitated by: Rest, Healthy diet  Neurological Neurological Review of Symptoms: Numbness, Other: [place] Neurological Symptoms/Conditions [RPT]:  tingling Neurological Management Strategies: Routine screening Neurological Self-Management Outcome: 3 (uncertain) Neurological Comment: patient is experiencing numbness and tingling to both hands and arms, this occurs while sleeping and during dialysis; discussed patient discuss these symptoms with the kidney specialist  HEENT HEENT Symptoms Reported: No symptoms reported      Cardiovascular Cardiovascular Symptoms Reported: No symptoms reported Does patient have uncontrolled Hypertension?: No Cardiovascular Management Strategies: Medication therapy, Routine screening, Diet modification, Fluid modification Cardiovascular Self-Management Outcome: 4 (good)  Respiratory Respiratory Symptoms Reported: No symptoms reported    Endocrine Endocrine Symptoms Reported: No symptoms reported Is patient diabetic?: Yes Is patient checking blood sugars at home?: No Endocrine Self-Management Outcome: 4 (good)  Gastrointestinal Gastrointestinal Symptoms Reported: Other Additional Gastrointestinal Details: chronic intermittent lose stools Gastrointestinal Management Strategies: Medication therapy, Diet modification Gastrointestinal Self-Management Outcome: 4 (good)    Genitourinary Genitourinary Symptoms Reported: Other Other Genitourinary Symptoms: ESRD Genitourinary Management Strategies: Hemodialysis Hemodialysis Schedule: M,W,F Hemodialysis Last Treatment: 12/06/24 Genitourinary Self-Management Outcome: 4 (good)  Integumentary Integumentary Symptoms Reported: Not assessed    Musculoskeletal Musculoskelatal Symptoms Reviewed: Joint pain, Unsteady gait, Limited mobility Additional Musculoskeletal Details: chronic right hip and right shoulder pain Musculoskeletal Management Strategies: Medication therapy, Routine screening, Exercise Musculoskeletal Self-Management Outcome: 4 (good)      Psychosocial Psychosocial Symptoms Reported: No symptoms reported   Major Change/Loss/Stressor/Fears (CP):  Denies Quality of Family Relationships: supportive, involved, helpful Do you feel physically threatened by others?: No    12/07/2024    PHQ2-9 Depression Screening   Whitney Walker interest or  pleasure in doing things    Feeling down, depressed, or hopeless    PHQ-2 - Total Score    Trouble falling or staying asleep, or sleeping too much    Feeling tired or having Whitney Walker energy    Poor appetite or overeating     Feeling bad about yourself - or that you are a failure or have let yourself or your family down    Trouble concentrating on things, such as reading the newspaper or watching television    Moving or speaking so slowly that other people could have noticed.  Or the opposite - being so fidgety or restless that you have been moving around a lot more than usual    Thoughts that you would be better off dead, or hurting yourself in some way    PHQ2-9 Total Score    If you checked off any problems, how difficult have these problems made it for you to do your work, take care of things at home, or get along with other people    Depression Interventions/Treatment      There were no vitals filed for this visit. Pain Scale: Not given for pain  Medications Reviewed Today     Reviewed by Whitney Clayborne CROME, RN (Registered Nurse) on 12/07/24 at 1152  Med List Status: <None>   Medication Order Taking? Sig Documenting Provider Last Dose Status Informant  acetaminophen  (TYLENOL ) 650 MG CR tablet [id number]  Take 1,950 mg by mouth every 8 (eight) hours as needed for pain. [provider]  Active Self, Pharmacy Records  albuterol  (VENTOLIN  HFA) 108 [phone] Base) MCG/ACT inhaler [id number]  Inhale 2 puffs into the lungs every 6 (six) hours as needed for wheezing or shortness of breath. Georgina Speaks, FNP  Active Self, Pharmacy Records  atorvastatin  (LIPITOR) 20 MG tablet [id number]  TAKE 1 TABLET (20 MG TOTAL) BY MOUTH DAILY. West, Katlyn D, NP  Active   Biotin  5 MG CAPS [id number]  Take 5 mg by mouth daily  at 6 (six) AM. [provider]  Active Self, Pharmacy Records  doxycycline  (ADOXA) 100 MG tablet [id number]  Take 100 mg by mouth 2 (two) times daily. Prescribed by Dr. Macel, nephrologist [provider]  Active   ELIQUIS  5 MG TABS tablet [id number]  TAKE 1 TABLET (5 MG TOTAL) BY MOUTH 2 (TWO) TIMES DAILY. Georgina Speaks, FNP  Active   ezetimibe  (ZETIA ) 10 MG tablet [id number]  Take 1 tablet (10 mg total) by mouth daily. Ladona Heinz, MD  Active   meclizine  (ANTIVERT ) 12.5 MG tablet [id number]  Take 1 tablet (12.5 mg total) by mouth 3 (three) times daily as needed for dizziness. Georgina Speaks, FNP  Active Self, Pharmacy Records           Med Note Whitney Walker, Whitney Walker   Wed May 12, 2024  8:38 AM) Has at home - hasn't used in a while.  midodrine  (PROAMATINE ) 5 MG tablet [id number]  Take 1 tablet (5 mg total) by mouth 2 (two) times daily as needed (hypotension). Take one tab before dialysis Ganji, Jay, MD  Active   multivitamin (RENA-VIT) TABS tablet [id number]  Take 1 tablet by mouth at bedtime. [provider]  Active Self, Pharmacy Records  omeprazole  (PRILOSEC) 20 MG capsule [id number]  TAKE 1 CAPSULE (20 MG TOTAL) BY MOUTH DAILY. Georgina Speaks, FNP  Active   sevelamer  carbonate (RENVELA ) 800 MG tablet [id number]  Take 800 mg by mouth at bedtime. [provider]  Active Self, Pharmacy Records  Med Note Whitney Walker, Whitney Walker   Wed May 12, 2024  8:35 AM)    sodium zirconium cyclosilicate  (LOKELMA ) 10 g PACK packet [id number]  Take 10 g by mouth daily. [provider]  Active Self, Pharmacy Records  Tenapanor HCl, CKD, 30 MG TABS [id number] Yes Take 1 tablet by mouth daily.  Patient taking differently: Take 1 tablet by mouth 2 (two) times daily.   [provider]  Active   Tiotropium Bromide-Olodaterol (STIOLTO RESPIMAT ) 2.5-2.5 MCG/ACT AERS [id number]  Inhale 2 puffs into the lungs daily. Hunsucker, Donnice SAUNDERS, MD  Active Self, Pharmacy Records   vitamin C  (ASCORBIC ACID ) 250 MG tablet [id number]  Take 250 mg by mouth daily. [provider]  Active Self, Pharmacy Records  vitamin E 200 UNIT capsule [id number]  Take 200 Units by mouth daily. [provider]  Active Self, Pharmacy Records  Med List Note Steffi Nian, CPhT 05/09/24 1149): Dialysis MWF            Recommendation:   PCP Follow-up  12/14/2024 Status: Sch   Time: 12:00 PM Length: 20  Visit Type: OFFICE VISIT [1004] Copay: $0.00  Provider: Georgina Speaks, FNP Department: [facility]    Follow Up Plan:   Telephone follow up appointment date/time   01/04/2025 Status: Sch   Time: 11:00 AM Length: 30  Visit Type: VBCI TELEPHONE CALL 30 [2502] Copay: $0.00  Provider: Morgan Clayborne CROME, RN Department: [facility]   Pharmacy referral sent to assist patient with best pharmacological options for smoking cessation; and patient is having difficulty filling Rx for Tenapanor HCl, CKD, 30 MG TABS    Rishikesh Khachatryan RN BSN CCM [place]  [facility], [facility] Nurse Care Coordinator  Direct Dial : [phone] Website: [email or web address]

## 2024-12-09 ENCOUNTER — Telehealth: Payer: Self-pay | Admitting: Pharmacist

## 2024-12-09 ENCOUNTER — Telehealth: Admitting: Nurse Practitioner

## 2024-12-09 ENCOUNTER — Encounter: Payer: Self-pay | Admitting: Nurse Practitioner

## 2024-12-09 DIAGNOSIS — I509 Heart failure, unspecified: Secondary | ICD-10-CM

## 2024-12-09 DIAGNOSIS — R7309 Other abnormal glucose: Secondary | ICD-10-CM

## 2024-12-09 DIAGNOSIS — I132 Hypertensive heart and chronic kidney disease with heart failure and with stage 5 chronic kidney disease, or end stage renal disease: Secondary | ICD-10-CM

## 2024-12-09 DIAGNOSIS — I739 Peripheral vascular disease, unspecified: Secondary | ICD-10-CM

## 2024-12-09 DIAGNOSIS — N186 End stage renal disease: Secondary | ICD-10-CM | POA: Diagnosis not present

## 2024-12-09 NOTE — Progress Notes (Signed)
 Patient ID: Whitney Walker, female   DOB: 10-31-1956, 68 y.o.   MRN: [id number]  Patient was called regarding referral for help with Xphozah.  Unfortunately, she did not answer her phone. HIPAA compliant message was left on her voicemail.    The referral listed a phone number for the Pharmacy the Patient was having trouble getting her medication from.  When I called the number, I discovered that it was the [facility].    The Representative at the Program said the Patient had been approved to receive Xphozah through their program since September of this year.  She also said that the most recent shipment was sent to the [facility] on 11/17/2024.  She mentioned that there was a notation that the shipment from 11/17/2024 was lost. She said a replacement shipment was being prepared today and would should arrive to the Patient tomorrow.  Plan: Follow up with the Patient next week. Route note to [employer], [facility].  Cassius DOROTHA Brought, PharmD, BCACP Clinical Pharmacist [phone]

## 2024-12-09 NOTE — Progress Notes (Unsigned)
 Virtual Visit via Video Note  Whitney Walker, Whitney Walker,acting as a scribe for Whitney Ada, FNP.,have documented all relevant documentation on the behalf of Whitney Ada, FNP,as directed by  Whitney Ada, FNP while in the presence of Whitney Ada, FNP.  I connected with Whitney Walker on 12/09/2024 at 11:40 AM EST by a video enabled telemedicine application and verified that I am speaking with the correct person using two identifiers.  Patient Location: Home {Provider Location:[phone]::Home Office}  I discussed the limitations, risks, security, and privacy concerns of performing an evaluation and management service by video and the availability of in person appointments. I also discussed with the patient that there may be a patient responsible charge related to this service. The patient expressed understanding and agreed to proceed.  Subjective: PCP: Walker Gaines, FNP  No chief complaint on file.  She has been to Cardiology recently Smoking cessation and she is having tingling to both arms (constant), she feels like she is not getting any circulation. She is not wearing a hand splint. This has been ongoing for the last 2 months which has worsened in the last 2 months  She reports Dr. Ladona said the PcP can prescribe midodrine  moving forward and to take it prior to dialysis  She reports she missed a step going down her stairs to the trash can and has a torn abductor (sprain) right hip, she goes back to orthopedics at the end of this month. She also went to therapy and is doing exercises at home. Does not hurt when walking only when laying down. When lay on left side more pain but if on right side does not hurt as much.      ROS: Per HPI Current Medications[1]  Observations/Objective: There were no vitals filed for this visit. Physical Exam Vitals and nursing note reviewed.  Constitutional:      General: She is not in acute distress.    Appearance: Normal appearance. She  is obese.  Pulmonary:     Effort: Pulmonary effort is normal. No respiratory distress.  Skin:    Capillary Refill: Capillary refill takes less than 2 seconds.  Neurological:     General: No focal deficit present.     Mental Status: She is alert and oriented to person, place, and time.     Cranial Nerves: No cranial nerve deficit.  Psychiatric:        Mood and Affect: Mood normal.        Behavior: Behavior normal.        Thought Content: Thought content normal.        Judgment: Judgment normal.    Assessment and Plan: There are no diagnoses linked to this encounter.  Follow Up Instructions: No follow-ups on file.   I discussed the assessment and treatment plan with the patient. The patient was provided an opportunity to ask questions, and all were answered. The patient agreed with the plan and demonstrated an understanding of the instructions.   The patient was advised to call back or seek an in-person evaluation if the symptoms worsen or if the condition fails to improve as anticipated.  The above assessment and management plan was discussed with the patient. The patient verbalized understanding of and has agreed to the management plan.   Whitney Whitney Ada, FNP, have reviewed all documentation for this visit. The documentation on 12/09/2024 for the exam, diagnosis, procedures, and orders are all accurate and complete.       [1]  Current Outpatient  Medications:    acetaminophen  (TYLENOL ) 650 MG CR tablet, Take 1,950 mg by mouth every 8 (eight) hours as needed for pain., Disp: , Rfl:    albuterol  (VENTOLIN  HFA) 108 (90 Base) MCG/ACT inhaler, Inhale 2 puffs into the lungs every 6 (six) hours as needed for wheezing or shortness of breath., Disp: 18 g, Rfl: 2   atorvastatin  (LIPITOR) 20 MG tablet, TAKE 1 TABLET (20 MG TOTAL) BY MOUTH DAILY., Disp: 30 tablet, Rfl: 6   Biotin  5 MG CAPS, Take 5 mg by mouth daily at 6 (six) AM., Disp: , Rfl:    doxycycline  (ADOXA) 100 MG tablet,  Take 100 mg by mouth 2 (two) times daily. Prescribed by Dr. Macel, nephrologist, Disp: , Rfl:    ELIQUIS  5 MG TABS tablet, TAKE 1 TABLET (5 MG TOTAL) BY MOUTH 2 (TWO) TIMES DAILY., Disp: 60 tablet, Rfl: 2   ezetimibe  (ZETIA ) 10 MG tablet, Take 1 tablet (10 mg total) by mouth daily., Disp: 90 tablet, Rfl: 0   meclizine  (ANTIVERT ) 12.5 MG tablet, Take 1 tablet (12.5 mg total) by mouth 3 (three) times daily as needed for dizziness., Disp: 90 tablet, Rfl: 1   midodrine  (PROAMATINE ) 5 MG tablet, Take 1 tablet (5 mg total) by mouth 2 (two) times daily as needed (hypotension). Take one tab before dialysis, Disp: 180 tablet, Rfl: 0   multivitamin (RENA-VIT) TABS tablet, Take 1 tablet by mouth at bedtime., Disp: , Rfl:    omeprazole  (PRILOSEC) 20 MG capsule, TAKE 1 CAPSULE (20 MG TOTAL) BY MOUTH DAILY., Disp: 90 capsule, Rfl: 1   sevelamer  carbonate (RENVELA ) 800 MG tablet, Take 800 mg by mouth at bedtime., Disp: , Rfl:    sodium zirconium cyclosilicate  (LOKELMA ) 10 g PACK packet, Take 10 g by mouth daily., Disp: , Rfl:    Tenapanor HCl, CKD, 30 MG TABS, Take 1 tablet by mouth daily. (Patient taking differently: Take 1 tablet by mouth 2 (two) times daily.), Disp: , Rfl:    Tiotropium Bromide-Olodaterol (STIOLTO RESPIMAT ) 2.5-2.5 MCG/ACT AERS, Inhale 2 puffs into the lungs daily., Disp: 1 each, Rfl: 11   vitamin C  (ASCORBIC ACID ) 250 MG tablet, Take 250 mg by mouth daily., Disp: , Rfl:    vitamin E 200 UNIT capsule, Take 200 Units by mouth daily., Disp: , Rfl:

## 2024-12-14 ENCOUNTER — Ambulatory Visit: Payer: Self-pay | Admitting: Nurse Practitioner

## 2024-12-17 ENCOUNTER — Telehealth: Payer: Self-pay | Admitting: Pharmacist

## 2024-12-17 DIAGNOSIS — I739 Peripheral vascular disease, unspecified: Secondary | ICD-10-CM

## 2024-12-17 MED ORDER — ALBUTEROL SULFATE HFA 108 (90 BASE) MCG/ACT IN AERS: 2.0000 | INHALATION_SPRAY | Freq: Four times a day (QID) | RESPIRATORY_TRACT | 2 refills | Status: AC | PRN

## 2024-12-17 MED ORDER — ATORVASTATIN CALCIUM 20 MG PO TABS: 20.0000 mg | ORAL_TABLET | Freq: Every day | ORAL | 3 refills | Status: AC

## 2024-12-17 NOTE — Progress Notes (Signed)
 "  12/17/2024 Name: Whitney Walker MRN: [id number] DOB: Jul 26, 1956  Chief Complaint  Patient presents with   Medication Management    Smoking Cessation    Whitney Walker is a 68 y.o. year old female who presented for a telephone visit.   They were referred to the pharmacist by their PCP for assistance in managing smoking cessation.    Subjective:  Whitney Walker is a 68 year old female referred for smoking cessation treatment options.  She has a medical history significant for:    end stage renal disease on dialysis, chronic diastolic heart failure, obstructive sleep apnea, morbid obesity, hypertension, hyperlipidemia, peripheral vascular disease with claudication, osteoarthritis of the left hip, restless leg syndrome, vitamin Walker  deficiency, and tobacco abuse.  Care Team: Primary Care Provider: Georgina Speaks, Walker   Medication Access/Adherence  Current Pharmacy:  [facility] - [place], [place] - [address] [place] [address] Phone: [phone] Fax: [phone]   Patient reports affordability concerns with their medications: No  Patient reports access/transportation concerns to their pharmacy: No  Patient reports adherence concerns with their medications:  No      Patient reported using the Patch and gum before and they did not help her stop smloing. She is often around people that smoke.   Tobacco Abuse:  Tobacco Use History: Number of cigarettes per day almost 1 pack Smokes first cigarette 15 minutes after waking Does not wake at night to smoke Triggers include -alcohol, playing cards, other people who smoke  Quit Attempt History: Most recent quit attempt 3 years ago Methods tried in the past include Nicotine Patch. Nicotine gum Rates IMPORTANCE of quitting tobacco on 1-10 scale of 10. Rates READINESS of quitting tobacco on 1-10 scale of 9. Rates CONFIDENCE of quitting tobacco on 1-10 scale of 8. Motivators to  quitting include health; barriers include friends smoke       Objective:  Lab Results  Component Value Date   HGBA1C 6.0 (H) 06/15/2024    Lab Results  Component Value Date   CREATININE 8.16 (HH) 06/22/2024   BUN 32 (H) 06/22/2024   NA 137 06/22/2024   K 4.6 06/22/2024   CL 97 (L) 06/22/2024   CO2 28 06/22/2024    Lab Results  Component Value Date   CHOL 171 07/29/2023   HDL 48 07/29/2023   LDLCALC 86 07/29/2023   TRIG 223 (H) 07/29/2023   CHOLHDL 3.6 07/29/2023    Medications Reviewed Today     Reviewed by Whitney Walker, [facility] (Pharmacist) on 12/17/24 at 1548  Med List Status: <None>   Medication Order Taking? Sig Documenting Provider Last Dose Status Informant  acetaminophen  (TYLENOL ) 650 MG CR tablet [id number] Yes Take 1,950 mg by mouth every 8 (eight) hours as needed for pain. Provider, Historical, Walker  Active Self, Pharmacy Records  albuterol  (VENTOLIN  HFA) 108 [phone] Base) MCG/ACT inhaler [id number] Yes Inhale 2 puffs into the lungs every 6 (six) hours as needed for wheezing or shortness of breath. Whitney Walker  Active Self, Pharmacy Records  atorvastatin  (LIPITOR) 20 MG tablet [id number] Yes TAKE 1 TABLET (20 MG TOTAL) BY MOUTH DAILY. West, Katlyn D, NP  Active   Biotin  5 MG CAPS [id number] Yes Take 5 mg by mouth daily at 6 (six) AM. Provider, Historical, Walker  Active Self, Pharmacy Records   Patient not taking:   Discontinued 12/17/24 1535 (Completed Course)   ELIQUIS  5 MG TABS tablet [id number] Yes TAKE 1 TABLET (  5 MG TOTAL) BY MOUTH 2 (TWO) TIMES DAILY. Whitney Walker  Active   ezetimibe  (ZETIA ) 10 MG tablet [id number] Yes Take 1 tablet (10 mg total) by mouth daily. Whitney Walker  Active   meclizine  (ANTIVERT ) 12.5 MG tablet [id number] Yes Take 1 tablet (12.5 mg total) by mouth 3 (three) times daily as needed for dizziness. Whitney Walker  Active Self, Pharmacy Records           Med Note Whitney Walker   Wed May 12, 2024  8:38 AM) Has at home - hasn't  used in a while.  midodrine  (PROAMATINE ) 5 MG tablet [id number] Yes Take 1 tablet (5 mg total) by mouth 2 (two) times daily as needed (hypotension). Take one tab before dialysis Whitney Walker  Active   multivitamin (RENA-VIT) TABS tablet [id number] Yes Take 1 tablet by mouth at bedtime. Provider, Historical, Walker  Active Self, Pharmacy Records  omeprazole  (PRILOSEC) 20 MG capsule [id number] Yes TAKE 1 CAPSULE (20 MG TOTAL) BY MOUTH DAILY. Whitney Walker  Active   sevelamer  carbonate (RENVELA ) 800 MG tablet [id number] Yes Take 800 mg by mouth at bedtime. Provider, Historical, Walker  Active Self, Pharmacy Records           Med Note CARLEEN, [place] Walker   Wed May 12, 2024  8:35 AM)    sodium zirconium cyclosilicate  (LOKELMA ) 10 g PACK packet [id number] Yes Take 10 g by mouth daily. Provider, Historical, Walker  Active Self, Pharmacy Records  Tenapanor HCl, CKD, 30 MG TABS [id number] Yes Take 1 tablet by mouth daily.  Patient taking differently: Take 1 tablet by mouth in the morning and at bedtime.   Provider, Historical, Walker  Active   Tiotropium Bromide-Olodaterol (STIOLTO RESPIMAT ) 2.5-2.5 MCG/ACT AERS [id number]  Inhale 2 puffs into the lungs daily.  Patient not taking: Reported on 12/17/2024   Whitney Walker  Active Self, Pharmacy Records  vitamin C  (ASCORBIC ACID ) 250 MG tablet [id number] Yes Take 250 mg by mouth daily. Provider, Historical, Walker  Active Self, Pharmacy Records  vitamin E 200 UNIT capsule [id number] Yes Take 200 Units by mouth daily. Provider, Historical, Walker  Active Self, Pharmacy Records  Med List Note Whitney Walker, CPhT 05/09/24 1149): Dialysis MWF              Assessment/Plan:   Smoking/Tobacco Cessation Counseling Whitney Walker. She is ready to quit at this time. Counseling provided today addressed the risks of continued use and the benefits of cessation. Discussed tobacco/nicotine use history, readiness to  quit, and evidence-based treatment options including behavioral strategies, support resources, and pharmacologic therapies. Provided encouragement and educational materials on steps and resources to quit smoking. Patient questions were addressed, and follow-up recommended for continued support.   Patient declined Bupropion and Chantix therapies.  She said she would try Nicotine Patches.  Since she smokes more than 10 cigarettes in a day, it is recommended that she start Nicotine 21 mcg patches--apply 1 patch daily.  Nicotine patches are not covered by Medicare Part Walker  Called [facility] to see if the Patient has an OTC benefit. The representative said she did not.   Patient requested refills on her Albuterol  inhaler and atorvastatin .  Follow Up Plan:    Route note to PCP for cosignature. Follow up with the Patient on Monday.   Cassius DOROTHA Brought, PharmD, BCACP Clinical Pharmacist [phone]   "

## 2024-12-19 NOTE — Assessment & Plan Note (Signed)
 Blood pressure is fairly controlled.  Continue current medications and follow-up with cardiology.

## 2024-12-19 NOTE — Assessment & Plan Note (Signed)
 Stable, will check A1c. Continue focusing on healthy diet.  She will come to pick up the order for her A1c lab.

## 2025-01-04 ENCOUNTER — Other Ambulatory Visit: Payer: Self-pay

## 2025-01-04 ENCOUNTER — Telehealth: Payer: Self-pay | Admitting: Pharmacist

## 2025-01-04 DIAGNOSIS — Z720 Tobacco use: Secondary | ICD-10-CM

## 2025-01-04 MED ORDER — NICOTINE 21 MG/24HR TD PT24: 21.0000 mg | MEDICATED_PATCH | TRANSDERMAL | 1 refills | Status: AC

## 2025-01-04 NOTE — Progress Notes (Signed)
 "  01/04/2025 Name: Whitney Walker MRN: [id number] DOB: 01-Oct-1956  Chief Complaint  Patient presents with   Medication Assistance    Nicotine  Patches    Whitney Walker is a 69 y.o. year old female who presented for a telephone visit.   They were referred to the pharmacist by their PCP for assistance in managing Smoking cessation.    Subjective: Whitney Walker is a 69 year old female referred for smoking cessation treatment options. She has a medical history significant for: end stage renal disease on dialysis, chronic diastolic heart failure, obstructive sleep apnea, morbid obesity, hypertension, hyperlipidemia, peripheral vascular disease with claudication, osteoarthritis of the left hip, restless leg syndrome, vitamin D  deficiency, and tobacco abuse.   Care Team: Primary Care Provider: Georgina Speaks, FNP ;    Medication Access/Adherence  Current Pharmacy:  [facility] - [place], [place] - [address] [place] [address] Phone: [phone] Fax: [phone]   Patient reports affordability concerns with their medications: No  Patient reports access/transportation concerns to their pharmacy: No  Patient reports adherence concerns with their medications:  No     From Last visit:  Tobacco Abuse:   Tobacco Use History: Number of cigarettes per day almost 1 pack Smokes first cigarette 15 minutes after waking Does not wake at night to smoke Triggers include -alcohol, playing cards, other people who smoke   Quit Attempt History: Most recent quit attempt 3 years ago Methods tried in the past include Nicotine  Patch. Nicotine  gum Rates IMPORTANCE of quitting tobacco on 1-10 scale of 10. Rates READINESS of quitting tobacco on 1-10 scale of 9. Rates CONFIDENCE of quitting tobacco on 1-10 scale of 8. Motivators to quitting include health; barriers include friends smoke    Objective:  Lab Results  Component Value Date    HGBA1C 6.0 (H) 06/15/2024    Lab Results  Component Value Date   CREATININE 8.16 (HH) 06/22/2024   BUN 32 (H) 06/22/2024   NA 137 06/22/2024   K 4.6 06/22/2024   CL 97 (L) 06/22/2024   CO2 28 06/22/2024    Lab Results  Component Value Date   CHOL 171 07/29/2023   HDL 48 07/29/2023   LDLCALC 86 07/29/2023   TRIG 223 (H) 07/29/2023   CHOLHDL 3.6 07/29/2023     Assessment/Plan:   Patient declined Bupropion and Chantix therapies.   She said she would try Nicotine  Patches.  Since she smokes more than 10 cigarettes in a day, it is recommended that she start Nicotine  21 mcg patches--apply 1 patch daily.   Nicotine  patches are not covered by Medicare Part D   Called [facility] a few weeks to see if the Patient has an OTC benefit. The representative said she did not.  Received a message from [name], RN today wondering about the status of the [name] nicotine  patches.  Clayborne found a GoodRx coupon for the Patient to use.   During today's telephone call, Patient was educated about nicotine  patches:   Instructed to apply 1 patch daily to clean, dry, hairless skin; rotate sites daily.  May wear 24 hours; advised to remove at bedtime if insomnia or vivid dreams occur.  Advised not to smoke while using patch.  Reviewed common side effects (skin irritation, nausea, headache, sleep disturbance).  Educated on signs of nicotine  excess and when to seek care.  Instructed to remove patch prior to MRI and dispose safely away from children/pets.  Patient verbalized understanding.  -  start nicotine  patch 21 mg daily.   - Patch Schedule for >10 cigarettes daily: Apply one 21 mg patch daily for 6 weeks. Then, reduce to one 14 mg patch daily for another 2 weeks, if able. Then, reduce to one 7 mg patch for another 2 weeks, if able.    Follow Up Plan:    Send prescription cosignature required to PCP. Follow up with Patient in 1 week.  Cassius DOROTHA Brought, PharmD, BCACP Clinical  Pharmacist [phone]    "

## 2025-01-05 NOTE — Patient Instructions (Signed)
 Visit Information  Thank you for taking time to visit with me today.    Please call 1-800-273-TALK (toll free, 24 hour hotline) if you are experiencing a Mental Health or Behavioral Health Crisis or need someone to talk to.  [name] RN BSN CCM [place]  [facility], [facility] Nurse Care Coordinator  Direct Dial: [phone] Website: [email or web address]

## 2025-01-05 NOTE — Patient Outreach (Signed)
 Complex Care Management   Visit Note  01/05/2025  Name:  Whitney Walker MRN: [id number] DOB: [birth year]  Situation: Referral received for Complex Care Management related to Heart Failure, COPD, ESRD on dialysis, chronic right shoulder pain, chronic right hip pain. I obtained verbal consent from Patient.  Visit completed with Patient on the phone.  Background:   Past Medical History:  Diagnosis Date   Acute left-sided low back pain without sciatica 02/22/2024   Anemia    Anxiety    CHF (congestive heart failure) (HCC)    Colon cancer (HCC)    treatment surgery   Complication of anesthesia    after first [facility]- [place] couldnt walk after, patient denies having a spinal   COPD (chronic obstructive pulmonary disease) (HCC)    Coronary artery disease    Depression    DVT (deep venous thrombosis) (HCC)    Encounter for annual health examination 08/05/2023   ESRD on hemodialysis (HCC)    Hemo: MWF   History of blood transfusion 04/2018   Hypertension    07/07/18- no longer takes BP medications   Left hip pain 05/16/2022   Left leg pain 05/09/2020   Leucocytosis 04/28/2012   Meningitis    Orthostatic hypotension 05/16/2022   Pain in limb 07/30/2013   PE (pulmonary embolism)    Peripheral vascular disease    Primary hypertension 11/15/2013   Rash 11/25/2023   Restless legs    Shortness of breath    with exertion   Sleep apnea    SOB (shortness of breath) 03/03/2019   Vertigo     Assessment: Patient Reported Symptoms:  Cognitive Cognitive Status: Alert and oriented to person, place, and time, Normal speech and language skills Cognitive/Intellectual Conditions Management [RPT]: None reported or documented in medical history or problem list   Health Maintenance Behaviors: Annual physical exam Health Facilitated by: Rest  Neurological Neurological Review of Symptoms: Numbness, Other: [place] Neurological Symptoms/Conditions [RPT]: numbness and tingling in both hands, worse in  the right hand Neurological Management Strategies: Routine screening Neurological Self-Management Outcome: 4 (good)  HEENT HEENT Symptoms Reported: No symptoms reported      Cardiovascular Cardiovascular Symptoms Reported: No symptoms reported Does patient have uncontrolled Hypertension?: No Cardiovascular Management Strategies: Adequate rest, Medication therapy, Routine screening, Diet modification Cardiovascular Self-Management Outcome: 4 (good)  Respiratory Respiratory Symptoms Reported: Shortness of breath, Dry cough Other Respiratory Symptoms: patient instructed to contact [facility] to schedule her follow up appointment Respiratory Management Strategies: Routine screening, Medication therapy Respiratory Self-Management Outcome: 4 (good)  Endocrine Endocrine Symptoms Reported: No symptoms reported    Gastrointestinal Gastrointestinal Symptoms Reported: No symptoms reported      Genitourinary Genitourinary Symptoms Reported: No symptoms reported    Integumentary Integumentary Symptoms Reported: Not assessed    Musculoskeletal Musculoskelatal Symptoms Reviewed: Back pain, Joint pain, Limited mobility, Unsteady gait Musculoskeletal Management Strategies: Adequate rest, Medical device, Routine screening, Exercise Musculoskeletal Self-Management Outcome: 4 (good) Falls in the past year?: Yes Number of falls in past year: 1 or less Was there an injury with Fall?: Yes Fall Risk Category Calculator: 2 Patient Fall Risk Level: Moderate Fall Risk Patient at Risk for Falls Due to: Impaired balance/gait, Impaired mobility, History of fall(s) Fall risk Follow up: Falls evaluation completed, Education provided  Psychosocial Psychosocial Symptoms Reported: Alteration in sleep habits   Major Change/Loss/Stressor/Fears (CP): Medical condition, self Techniques to Cope with Loss/Stress/Change: Diversional activities Quality of Family Relationships: helpful, involved, supportive Do  you feel physically threatened by others?: No  01/05/2025    PHQ2-9 Depression Screening   Airen Dales interest or pleasure in doing things    Feeling down, depressed, or hopeless    PHQ-2 - Total Score    Trouble falling or staying asleep, or sleeping too much    Feeling tired or having Blimie Vaness energy    Poor appetite or overeating     Feeling bad about yourself - or that you are a failure or have let yourself or your family down    Trouble concentrating on things, such as reading the newspaper or watching television    Moving or speaking so slowly that other people could have noticed.  Or the opposite - being so fidgety or restless that you have been moving around a lot more than usual    Thoughts that you would be better off dead, or hurting yourself in some way    PHQ2-9 Total Score    If you checked off any problems, how difficult have these problems made it for you to do your work, take care of things at home, or get along with other people    Depression Interventions/Treatment      There were no vitals filed for this visit. Pain Scale: Not given for pain  Medications Reviewed Today     Reviewed by Morgan Clayborne CROME, RN (Registered Nurse) on 01/04/25 at 1118  Med List Status: <None>   Medication Order Taking? Sig Documenting Provider Last Dose Status Informant  acetaminophen  (TYLENOL ) 650 MG CR tablet [id number]  Take 1,950 mg by mouth every 8 (eight) hours as needed for pain. [provider]  Active Self, Pharmacy Records  albuterol  (VENTOLIN  HFA) 108 (90 Base) MCG/ACT inhaler [id number]  Inhale 2 puffs into the lungs every 6 (six) hours as needed for wheezing or shortness of breath. Georgina Speaks, FNP  Active   atorvastatin  (LIPITOR) 20 MG tablet [id number]  Take 1 tablet (20 mg total) by mouth daily. Georgina Speaks, FNP  Active   Biotin  5 MG CAPS [id number]  Take 5 mg by mouth daily at 6 (six) AM. [provider]  Active Self, Pharmacy Records  ELIQUIS  5 MG TABS tablet  [id number]  TAKE 1 TABLET (5 MG TOTAL) BY MOUTH 2 (TWO) TIMES DAILY. Georgina Speaks, FNP  Active   ezetimibe  (ZETIA ) 10 MG tablet [id number]  Take 1 tablet (10 mg total) by mouth daily. Ladona Heinz, MD  Active   meclizine  (ANTIVERT ) 12.5 MG tablet [id number]  Take 1 tablet (12.5 mg total) by mouth 3 (three) times daily as needed for dizziness. Georgina Speaks, FNP  Active Self, Pharmacy Records           Med Note CARLEEN, Ralston D   Wed May 12, 2024  8:38 AM) Has at home - hasn't used in a while.  midodrine  (PROAMATINE ) 5 MG tablet [id number]  Take 1 tablet (5 mg total) by mouth 2 (two) times daily as needed (hypotension). Take one tab before dialysis Ganji, Jay, MD  Active   multivitamin (RENA-VIT) TABS tablet [id number]  Take 1 tablet by mouth at bedtime. [provider]  Active Self, Pharmacy Records  omeprazole  (PRILOSEC) 20 MG capsule [id number]  TAKE 1 CAPSULE (20 MG TOTAL) BY MOUTH DAILY. Georgina Speaks, FNP  Active   sevelamer  carbonate (RENVELA ) 800 MG tablet [id number]  Take 800 mg by mouth at bedtime. [provider]  Active Self, Pharmacy Records           Med Note CARLEEN, [place] D   Wed May 12, 2024  8:35 AM)    sodium zirconium cyclosilicate  (LOKELMA ) 10 g PACK packet [id number]  Take 10 g by mouth daily. [provider]  Active Self, Pharmacy Records  Tenapanor HCl, CKD, 30 MG TABS [id number]  Take 1 tablet by mouth daily.  Patient taking differently: Take 1 tablet by mouth in the morning and at bedtime.   [provider]  Active   Tiotropium Bromide-Olodaterol (STIOLTO RESPIMAT ) 2.5-2.5 MCG/ACT AERS [id number]  Inhale 2 puffs into the lungs daily.  Patient not taking: Reported on 12/17/2024   Hunsucker, Donnice SAUNDERS, MD  Active Self, Pharmacy Records  vitamin C  (ASCORBIC ACID ) 250 MG tablet [id number]  Take 250 mg by mouth daily. [provider]  Active Self, Pharmacy Records  vitamin E 200 UNIT capsule [id number]  Take 200 Units by mouth daily.  [provider]  Active Self, Pharmacy Records  Med List Note Steffi Nian, CPhT 05/09/24 1149): Dialysis MWF            Recommendation:   Continue Current Plan of Care  Follow Up Plan:   Closing from complex care management   [name] RN BSN CCM [facility]  [facility], [facility] Nurse Care Coordinator  Direct Dial : [phone] Website: [email or web address]

## 2025-01-12 ENCOUNTER — Telehealth: Payer: Self-pay | Admitting: Pharmacist

## 2025-01-12 DIAGNOSIS — Z720 Tobacco use: Secondary | ICD-10-CM

## 2025-01-12 NOTE — Progress Notes (Signed)
" ° °  01/12/2025 Name: Whitney Walker MRN: [id number] DOB: 06-05-1956  Chief Complaint  Patient presents with   Medication Management    Smoking Cessation    Whitney Walker is a 69 y.o. year old female who presented for a telephone visit.   They were referred to the pharmacist by their PCP for assistance in managing smoking cessation.    Subjective: Whitney Walker is a 69 year old female referred for smoking cessation treatment options. She has a medical history significant for: end stage renal disease on dialysis, chronic diastolic heart failure, obstructive sleep apnea, morbid obesity, hypertension, hyperlipidemia, peripheral vascular disease with claudication, osteoarthritis of the left hip, restless leg syndrome, vitamin D  deficiency, and tobacco abuse.   She was started on Nicotine  Patches last week. The purpose of today's call was to follow up. HIPAA identifiers were obtained.    Care Team: Primary Care Provider: Georgina Speaks, FNP   Medication Access/Adherence  Current Pharmacy:  [facility] - [place], [place] - [address] [place] [address] Phone: [phone] Fax: [phone]  [facility] [phone] - [place], [place] [address] AT [facility] [address] [facility] [address] [place] [address] Phone: [phone] Fax: [phone]     Patient reported picking up her Nicotine  Patches as prescribed.  She said she has been using them and has not smoked since she started.  She also said it was a dialysis day and she was not feeling well. She was celebrated for starting therapy and not smoking.    Objective:  Lab Results  Component Value Date   HGBA1C 6.0 (H) 06/15/2024    Lab Results  Component Value Date   CREATININE 8.16 (HH) 06/22/2024   BUN 32 (H) 06/22/2024   NA 137 06/22/2024   K 4.6 06/22/2024   CL 97 (L) 06/22/2024   CO2 28 06/22/2024    Lab Results  Component Value  Date   CHOL 171 07/29/2023   HDL 48 07/29/2023   LDLCALC 86 07/29/2023   TRIG 223 (H) 07/29/2023   CHOLHDL 3.6 07/29/2023    Medications Reviewed Today   Medications were not reviewed in this encounter       Assessment/Plan:   Tobacco Abuse - Currently uncontrolled - Provided motivational interviewing to assess tobacco use and strategies for reduction - Patient was Provided information on 1 800 QUIT NOW support program as well as the Nicotine  Patches during our last visit:  - - She was started on nicotine  patches 21 mg daily. Counseled on proper placement and potential side effects, including mild itching/redness at the location site, headache, trouble sleeping and/or vivid dreams. Advised to remove patch at night if development of trouble sleeping.  - Her current instructions are:  Apply one 21 mg patch daily for 6 weeks. Then, reduce to one 14 mg patch daily for another 2 weeks, if able. Then, reduce to one 7 mg patch for another 2 weeks, if able.     Follow Up Plan:    Follow up with the Patient in 2 weeks.  Cassius DOROTHA Brought, PharmD, BCACP Clinical Pharmacist [phone]    "

## 2025-01-26 ENCOUNTER — Other Ambulatory Visit: Payer: Self-pay

## 2025-01-26 ENCOUNTER — Emergency Department (HOSPITAL_COMMUNITY)

## 2025-01-26 ENCOUNTER — Inpatient Hospital Stay (HOSPITAL_COMMUNITY)
Admission: EM | Admit: 2025-01-26 | Source: Home / Self Care | Attending: Internal Medicine | Admitting: Internal Medicine

## 2025-01-26 ENCOUNTER — Encounter (HOSPITAL_COMMUNITY): Payer: Self-pay

## 2025-01-26 DIAGNOSIS — I951 Orthostatic hypotension: Secondary | ICD-10-CM

## 2025-01-26 DIAGNOSIS — I953 Hypotension of hemodialysis: Secondary | ICD-10-CM

## 2025-01-26 DIAGNOSIS — E7800 Pure hypercholesterolemia, unspecified: Secondary | ICD-10-CM

## 2025-01-26 DIAGNOSIS — I739 Peripheral vascular disease, unspecified: Secondary | ICD-10-CM

## 2025-01-26 DIAGNOSIS — A419 Sepsis, unspecified organism: Principal | ICD-10-CM

## 2025-01-26 DIAGNOSIS — E872 Acidosis, unspecified: Secondary | ICD-10-CM

## 2025-01-26 LAB — I-STAT CG4 LACTIC ACID, ED: Lactic Acid, Venous: 2.4 mmol/L (ref 0.5–1.9)

## 2025-01-26 LAB — CBC WITH DIFFERENTIAL/PLATELET
Abs Immature Granulocytes: 0.17 10*3/uL — ABNORMAL HIGH (ref 0.00–0.07)
Basophils Absolute: 0.1 10*3/uL (ref 0.0–0.1)
Basophils Relative: 0 %
Eosinophils Absolute: 0.1 10*3/uL (ref 0.0–0.5)
Eosinophils Relative: 1 %
HCT: 44.8 % (ref 36.0–46.0)
Hemoglobin: 14.7 g/dL (ref 12.0–15.0)
Immature Granulocytes: 2 %
Lymphocytes Relative: 15 %
Lymphs Abs: 1.8 10*3/uL (ref 0.7–4.0)
MCH: 29.5 pg (ref 26.0–34.0)
MCHC: 32.8 g/dL (ref 30.0–36.0)
MCV: 90 fL (ref 80.0–100.0)
Monocytes Absolute: 0.8 10*3/uL (ref 0.1–1.0)
Monocytes Relative: 7 %
Neutro Abs: 8.7 10*3/uL — ABNORMAL HIGH (ref 1.7–7.7)
Neutrophils Relative %: 75 %
Platelets: 150 10*3/uL (ref 150–400)
RBC: 4.98 MIL/uL (ref 3.87–5.11)
RDW: 15.2 % (ref 11.5–15.5)
WBC: 11.6 10*3/uL — ABNORMAL HIGH (ref 4.0–10.5)
nRBC: 0 % (ref 0.0–0.2)

## 2025-01-26 LAB — I-STAT CHEM 8, ED
BUN: 41 mg/dL — ABNORMAL HIGH (ref 8–23)
Calcium, Ion: 0.92 mmol/L — ABNORMAL LOW (ref 1.15–1.40)
Chloride: 103 mmol/L (ref 98–111)
Creatinine, Ser: 9.5 mg/dL — ABNORMAL HIGH (ref 0.44–1.00)
Glucose, Bld: 92 mg/dL (ref 70–99)
HCT: 49 % — ABNORMAL HIGH (ref 36.0–46.0)
Hemoglobin: 16.7 g/dL — ABNORMAL HIGH (ref 12.0–15.0)
Potassium: 4.8 mmol/L (ref 3.5–5.1)
Sodium: 133 mmol/L — ABNORMAL LOW (ref 135–145)
TCO2: 18 mmol/L — ABNORMAL LOW (ref 22–32)

## 2025-01-26 LAB — COMPREHENSIVE METABOLIC PANEL WITH GFR
ALT: 26 U/L (ref 0–44)
AST: 32 U/L (ref 15–41)
Albumin: 3.7 g/dL (ref 3.5–5.0)
Alkaline Phosphatase: 130 U/L — ABNORMAL HIGH (ref 38–126)
Anion gap: 23 — ABNORMAL HIGH (ref 5–15)
BUN: 39 mg/dL — ABNORMAL HIGH (ref 8–23)
CO2: 14 mmol/L — ABNORMAL LOW (ref 22–32)
Calcium: 8.7 mg/dL — ABNORMAL LOW (ref 8.9–10.3)
Chloride: 92 mmol/L — ABNORMAL LOW (ref 98–111)
Creatinine, Ser: 9.5 mg/dL — ABNORMAL HIGH (ref 0.44–1.00)
GFR, Estimated: 4 mL/min — ABNORMAL LOW
Glucose, Bld: 86 mg/dL (ref 70–99)
Potassium: 5.1 mmol/L (ref 3.5–5.1)
Sodium: 130 mmol/L — ABNORMAL LOW (ref 135–145)
Total Bilirubin: 1 mg/dL (ref 0.0–1.2)
Total Protein: 7.8 g/dL (ref 6.5–8.1)

## 2025-01-26 LAB — RESP PANEL BY RT-PCR (RSV, FLU A&B, COVID)  RVPGX2
Influenza A by PCR: NEGATIVE
Influenza B by PCR: NEGATIVE
Resp Syncytial Virus by PCR: NEGATIVE
SARS Coronavirus 2 by RT PCR: NEGATIVE

## 2025-01-26 LAB — LIPASE, BLOOD: Lipase: 40 U/L (ref 11–51)

## 2025-01-26 LAB — MAGNESIUM: Magnesium: 2.2 mg/dL (ref 1.7–2.4)

## 2025-01-26 LAB — GLUCOSE, CAPILLARY
Glucose-Capillary: 93 mg/dL (ref 70–99)
Glucose-Capillary: 232 mg/dL — ABNORMAL HIGH (ref 70–99)

## 2025-01-26 LAB — LACTIC ACID, PLASMA: Lactic Acid, Venous: 1.8 mmol/L (ref 0.5–1.9)

## 2025-01-26 LAB — MRSA NEXT GEN BY PCR, NASAL: MRSA by PCR Next Gen: NOT DETECTED

## 2025-01-26 MED ORDER — LACTATED RINGERS IV BOLUS
1000.0000 mL | Freq: Once | INTRAVENOUS | Status: AC
  Administered 2025-01-26: 1000 mL via INTRAVENOUS

## 2025-01-26 MED ORDER — NICOTINE 21 MG/24HR TD PT24
21.0000 mg | MEDICATED_PATCH | TRANSDERMAL | Status: AC
  Administered 2025-01-26 – 2025-02-04 (×10): 21 mg via TRANSDERMAL
  Filled 2025-01-26 (×10): qty 1

## 2025-01-26 MED ORDER — VANCOMYCIN HCL IN DEXTROSE 1-5 GM/200ML-% IV SOLN
1000.0000 mg | INTRAVENOUS | Status: AC
  Administered 2025-01-28 – 2025-02-04 (×3): 1000 mg via INTRAVENOUS
  Filled 2025-01-26 (×3): qty 200

## 2025-01-26 MED ORDER — PANTOPRAZOLE SODIUM 40 MG PO TBEC
40.0000 mg | DELAYED_RELEASE_TABLET | Freq: Every day | ORAL | Status: AC
  Administered 2025-01-26 – 2025-02-03 (×9): 40 mg via ORAL
  Filled 2025-01-26 (×9): qty 1

## 2025-01-26 MED ORDER — EZETIMIBE 10 MG PO TABS
10.0000 mg | ORAL_TABLET | Freq: Every day | ORAL | Status: DC
  Administered 2025-01-27 – 2025-01-28 (×2): 10 mg via ORAL
  Filled 2025-01-26 (×3): qty 1

## 2025-01-26 MED ORDER — IOHEXOL 350 MG/ML SOLN
75.0000 mL | Freq: Once | INTRAVENOUS | Status: AC | PRN
  Administered 2025-01-26: 75 mL via INTRAVENOUS

## 2025-01-26 MED ORDER — VANCOMYCIN HCL 2000 MG/400ML IV SOLN
2000.0000 mg | Freq: Once | INTRAVENOUS | Status: AC
  Administered 2025-01-26: 2000 mg via INTRAVENOUS
  Filled 2025-01-26: qty 400

## 2025-01-26 MED ORDER — SODIUM CHLORIDE 0.9 % IV BOLUS
500.0000 mL | Freq: Once | INTRAVENOUS | Status: AC
  Administered 2025-01-26: 500 mL via INTRAVENOUS

## 2025-01-26 MED ORDER — SODIUM CHLORIDE 0.9 % IV SOLN
1.0000 g | INTRAVENOUS | Status: DC
  Administered 2025-01-26 – 2025-01-27 (×2): 1 g via INTRAVENOUS
  Filled 2025-01-26 (×4): qty 10

## 2025-01-26 MED ORDER — ALBUTEROL SULFATE (2.5 MG/3ML) 0.083% IN NEBU: 2.5000 mg | INHALATION_SOLUTION | Freq: Four times a day (QID) | RESPIRATORY_TRACT | Status: AC | PRN

## 2025-01-26 MED ORDER — VITAMIN C 500 MG PO TABS
250.0000 mg | ORAL_TABLET | Freq: Every day | ORAL | Status: AC
  Administered 2025-01-27 – 2025-02-03 (×6): 250 mg via ORAL
  Filled 2025-01-26 (×8): qty 1

## 2025-01-26 MED ORDER — ORAL CARE MOUTH RINSE: 15.0000 mL | OROMUCOSAL | Status: AC | PRN

## 2025-01-26 MED ORDER — CHLORHEXIDINE GLUCONATE CLOTH 2 % EX PADS
6.0000 | MEDICATED_PAD | Freq: Every day | CUTANEOUS | Status: DC
  Administered 2025-01-26: 6 via TOPICAL

## 2025-01-26 MED ORDER — SODIUM CHLORIDE 0.9 % IV SOLN: 250.0000 mL | INTRAVENOUS | Status: AC

## 2025-01-26 MED ORDER — NOREPINEPHRINE 4 MG/250ML-% IV SOLN
0.0000 ug/min | INTRAVENOUS | Status: DC
  Administered 2025-01-27: 3 ug/min via INTRAVENOUS
  Filled 2025-01-26: qty 250

## 2025-01-26 MED ORDER — MECLIZINE HCL 12.5 MG PO TABS: 12.5000 mg | ORAL_TABLET | Freq: Three times a day (TID) | ORAL | Status: AC | PRN

## 2025-01-26 MED ORDER — ACETAMINOPHEN 325 MG PO TABS
650.0000 mg | ORAL_TABLET | Freq: Three times a day (TID) | ORAL | Status: AC | PRN
  Administered 2025-01-28 – 2025-02-02 (×6): 650 mg via ORAL
  Filled 2025-01-26 (×7): qty 2

## 2025-01-26 MED ORDER — TENAPANOR HCL (CKD) 30 MG PO TABS: 1.0000 | ORAL_TABLET | Freq: Every day | ORAL | Status: DC

## 2025-01-26 MED ORDER — SEVELAMER CARBONATE 800 MG PO TABS
800.0000 mg | ORAL_TABLET | Freq: Every day | ORAL | Status: DC
  Administered 2025-01-26 – 2025-01-28 (×3): 800 mg via ORAL
  Filled 2025-01-26 (×3): qty 1

## 2025-01-26 MED ORDER — NOREPINEPHRINE 4 MG/250ML-% IV SOLN
0.0000 ug/min | INTRAVENOUS | Status: DC
  Administered 2025-01-26: 2 ug/min via INTRAVENOUS

## 2025-01-26 MED ORDER — MIDODRINE HCL 5 MG PO TABS
5.0000 mg | ORAL_TABLET | Freq: Three times a day (TID) | ORAL | Status: AC
  Administered 2025-01-26 – 2025-02-04 (×9): 5 mg via ORAL
  Filled 2025-01-26 (×13): qty 1

## 2025-01-26 MED ORDER — VITAMIN E 45 MG (100 UNIT) PO CAPS
200.0000 [IU] | ORAL_CAPSULE | Freq: Every day | ORAL | Status: AC
  Administered 2025-01-26 – 2025-02-03 (×9): 200 [IU] via ORAL
  Filled 2025-01-26 (×10): qty 2

## 2025-01-26 MED ORDER — ATORVASTATIN CALCIUM 10 MG PO TABS
20.0000 mg | ORAL_TABLET | Freq: Every day | ORAL | Status: DC
  Administered 2025-01-27 – 2025-01-28 (×2): 20 mg via ORAL
  Filled 2025-01-26 (×3): qty 2

## 2025-01-26 MED ORDER — PIPERACILLIN-TAZOBACTAM 3.375 G IVPB 30 MIN
3.3750 g | Freq: Once | INTRAVENOUS | Status: AC
  Administered 2025-01-26: 3.375 g via INTRAVENOUS

## 2025-01-26 MED ORDER — MIDODRINE HCL 5 MG PO TABS
10.0000 mg | ORAL_TABLET | Freq: Once | ORAL | Status: AC
  Administered 2025-01-26: 10 mg via ORAL
  Filled 2025-01-26: qty 2

## 2025-01-26 MED ORDER — APIXABAN 5 MG PO TABS
5.0000 mg | ORAL_TABLET | Freq: Two times a day (BID) | ORAL | Status: DC
  Administered 2025-01-26 – 2025-01-28 (×4): 5 mg via ORAL
  Filled 2025-01-26 (×4): qty 1

## 2025-01-26 NOTE — ED Notes (Signed)
 Pt seemingly orthostatic, c/o headache when laying down but BP drops when sitting up

## 2025-01-26 NOTE — ED Notes (Addendum)
 Pt adamant about needing to sit up on side of bed. This nurse explained to patient that her blood pressure drops significantly when sitting up and it is not safe for her to get out bed. Pt agitated and frustrated. Pt pulled up in bed and sat head of bed up. BP dropped to 89/72. Pt took 650mg  of Tylenol  from her own belongings per pt and family member for headache. MD notified.

## 2025-01-26 NOTE — ED Provider Notes (Signed)
 " [place] [facility] Provider Note   CSN: [id number] Arrival date & time: 01/26/25  9146     Patient presents with: Hypotension and Weakness   Whitney Walker is a 69 y.o. female.    Weakness   Patient presents because of weakness.  According to EMS, patient was at dialysis.  Received about 2 hours out of her 3-hour dialysis session and subsequently had to stop because of hypotension.  It sounds like she dropped her blood pressure sometimes during dialysis but usually responds well to fluid being administered back.  Patient received 500 cc of fluid dialysis but did not help with pressure.  Was found to have a systolic of 60.  Subsequently patient was transported to the ED.  Patient states that she has been having a cough, nausea, some generalized weakness since approximately Saturday.  Missed Monday treatment.  Some generalized abdominal pain more burning sensation around her abdomen.  Is been compliant with all of her medications including Eliquis .  Chronic diarrhea.  No blood in her stool that she can appreciate.  No chest pain at this time.  Endorsing some chronic shortness of breath but nothing significantly worse.  She states that she no longer produces urine.   Previous medical history reviewed : Last admitted in May 2025.  DVT.  Chronic diastolic heart failure.  Atypical chest pain.      Prior to Admission medications  Medication Sig Start Date End Date Taking? Authorizing Provider  acetaminophen  (TYLENOL ) 650 MG CR tablet Take 1,950 mg by mouth every 8 (eight) hours as needed for pain.    [provider]  albuterol  (VENTOLIN  HFA) 108 (90 Base) MCG/ACT inhaler Inhale 2 puffs into the lungs every 6 (six) hours as needed for wheezing or shortness of breath. 12/17/24   Georgina Speaks, FNP  atorvastatin  (LIPITOR) 20 MG tablet Take 1 tablet (20 mg total) by mouth daily. 12/17/24 01/16/25  Georgina Speaks, FNP  Biotin  5 MG CAPS Take 5 mg  by mouth daily at 6 (six) AM.    [provider]  ELIQUIS  5 MG TABS tablet TAKE 1 TABLET (5 MG TOTAL) BY MOUTH 2 (TWO) TIMES DAILY. 11/22/24   Georgina Speaks, FNP  ezetimibe  (ZETIA ) 10 MG tablet Take 1 tablet (10 mg total) by mouth daily. 12/02/24 03/02/25  Ladona Heinz, MD  meclizine  (ANTIVERT ) 12.5 MG tablet Take 1 tablet (12.5 mg total) by mouth 3 (three) times daily as needed for dizziness. 09/18/23   Georgina Speaks, FNP  midodrine  (PROAMATINE ) 5 MG tablet Take 1 tablet (5 mg total) by mouth 2 (two) times daily as needed (hypotension). Take one tab before dialysis 12/02/24   Ganji, Jay, MD  multivitamin (RENA-VIT) TABS tablet Take 1 tablet by mouth at bedtime.    [provider]  nicotine  (NICODERM CQ  - DOSED IN MG/24 HOURS) 21 mg/24hr patch Place 1 patch (21 mg total) onto the skin daily. 01/04/25 01/04/26  Georgina Speaks, FNP  omeprazole  (PRILOSEC) 20 MG capsule TAKE 1 CAPSULE (20 MG TOTAL) BY MOUTH DAILY. 11/22/24   Georgina Speaks, FNP  sevelamer  carbonate (RENVELA ) 800 MG tablet Take 800 mg by mouth at bedtime. 10/27/23   [provider]  sodium zirconium cyclosilicate  (LOKELMA ) 10 g PACK packet Take 10 g by mouth daily.    [provider]  Tenapanor  HCl, CKD, 30 MG TABS Take 1 tablet by mouth daily. Patient taking differently: Take 1 tablet by mouth in the morning and at bedtime.  [provider]  Tiotropium Bromide-Olodaterol (STIOLTO RESPIMAT ) 2.5-2.5 MCG/ACT AERS Inhale 2 puffs into the lungs daily. Patient not taking: Reported on 12/17/2024 05/04/24   Hunsucker, Donnice SAUNDERS, MD  vitamin C  (ASCORBIC ACID ) 250 MG tablet Take 250 mg by mouth daily.    [provider]  vitamin E  200 UNIT capsule Take 200 Units by mouth daily.    [provider]    Allergies: Difelikefalin, Clindamycin /lincomycin, and Gadolinium derivatives    Review of Systems  Neurological:  Positive for weakness.    Updated Vital Signs BP 92/73   Pulse (!) 110   Resp  17   Wt 92 kg   SpO2 96%   BMI 38.32 kg/m   Physical Exam  (all labs ordered are listed, but only abnormal results are displayed) Labs Reviewed  CBC WITH DIFFERENTIAL/PLATELET - Abnormal; Notable for the following components:      Result Value   WBC 11.6 (*)    Neutro Abs 8.7 (*)    Abs Immature Granulocytes 0.17 (*)    All other components within normal limits  I-STAT CHEM 8, ED - Abnormal; Notable for the following components:   Sodium 133 (*)    BUN 41 (*)    Creatinine, Ser 9.50 (*)    Calcium , Ion 0.92 (*)    TCO2 18 (*)    Hemoglobin 16.7 (*)    HCT 49.0 (*)    All other components within normal limits  I-STAT CG4 LACTIC ACID, ED - Abnormal; Notable for the following components:   Lactic Acid, Venous 2.4 (*)    All other components within normal limits  RESP PANEL BY RT-PCR (RSV, FLU A&B, COVID)  RVPGX2  CULTURE, BLOOD (ROUTINE X 2)  CULTURE, BLOOD (ROUTINE X 2)  COMPREHENSIVE METABOLIC PANEL WITH GFR  LIPASE, BLOOD  MAGNESIUM   I-STAT CG4 LACTIC ACID, ED    EKG: EKG Interpretation Date/Time:  Wednesday January 26 2025 09:09:55 EST Ventricular Rate:  109 PR Interval:  164 QRS Duration:  80 QT Interval:  357 QTC Calculation: 481 R Axis:   -1  Text Interpretation: Sinus tachycardia Nonspecific T abnrm, anterolateral leads Confirmed by Simon Rea [phone]) on 01/26/2025 9:31:53 AM  Radiology: [facility] Chest Portable 1 View Result Date: 01/26/2025 CLINICAL DATA:  Sepsis. EXAM: PORTABLE CHEST 1 VIEW COMPARISON:  05/19/2024 been CT chest 01/26/2025. FINDINGS: Trachea is midline. Heart size stable. Left IJ dialysis catheter tips are in the SVC and right atrium. Mild basilar streaky opacification. No pleural fluid. IMPRESSION: Minimal bibasilar subsegmental atelectasis. Electronically Signed   By: Newell Eke M.D.   On: 01/26/2025 11:34   CT Angio Chest PE W and/or Wo Contrast Result Date: 01/26/2025 CLINICAL DATA:  history of DVT. Hypotension and tachy. Likely sepsis.  Eval for source and PE; sepsis. Diarrhea. Pain in llq. Eval for diverticulitis EXAM: CT ANGIOGRAPHY CHEST CT ABDOMEN AND PELVIS WITH CONTRAST TECHNIQUE: Multidetector CT imaging of the chest was performed using the standard protocol during bolus administration of intravenous contrast. Multiplanar CT image reconstructions and MIPs were obtained to evaluate the vascular anatomy. Multidetector CT imaging of the abdomen and pelvis was performed using the standard protocol during bolus administration of intravenous contrast. RADIATION DOSE REDUCTION: This exam was performed according to the departmental dose-optimization program which includes automated exposure control, adjustment of the mA and/or kV according to patient size and/or use of iterative reconstruction technique. CONTRAST:  75mL OMNIPAQUE  IOHEXOL  350 MG/ML SOLN COMPARISON:  Chest CT 05/19/2024 and 05/09/2024. Abdominopelvic CT 12/01/2023.  FINDINGS: CTA CHEST FINDINGS Cardiovascular: The pulmonary arteries are well opacified with contrast to the level of the segmental branches. There is no evidence of acute pulmonary embolism. There is central enlargement of the pulmonary arteries. Diffuse atherosclerosis of the aorta, great vessels and coronary arteries with calcifications of the aortic valve. The heart size is normal. There is no pericardial effusion. Mediastinum/Nodes: There are no enlarged mediastinal, hilar or axillary lymph nodes. The thyroid  gland, trachea and esophagus demonstrate no significant findings. Lungs/Pleura: No pleural effusion or pneumothorax. Mild centrilobular emphysema and central airway thickening. Interval improvement in previously demonstrated linear right lung atelectasis. There is a new part solid nodule inferiorly in the right lower lobe, measuring 6 x 6 mm on image 57/12. 3 mm left upper lobe nodule on image 37/12 appears new. No confluent airspace disease. Musculoskeletal/Chest wall: No chest wall mass or suspicious osseous  findings. Multilevel spondylosis. CT ABDOMEN AND PELVIS FINDINGS Hepatobiliary: The liver is normal in density without suspicious focal abnormality. No significant biliary dilatation status post cholecystectomy. Pancreas: Unremarkable. No pancreatic ductal dilatation or surrounding inflammatory changes. Spleen: Stable splenic lobularity and scarring. No focal abnormality. Adrenals/Urinary Tract: Both adrenal glands appear normal. No evidence of urinary tract calculus, hydronephrosis or perinephric soft tissue stranding. The kidneys appear unchanged with cortical thinning and simple appearing cysts bilaterally for which no specific follow-up imaging is recommended. The urinary bladder is nearly empty and suboptimally evaluated. Stomach/Bowel: No enteric contrast administered. Stable small hiatal hernia. The stomach otherwise appears unremarkable for its degree of distention. Stable postsurgical changes from subtotal colectomy with ileorectal anastomosis. The rectum is fluid-filled without significant distension, wall thickening or surrounding inflammation. Vascular/Lymphatic: There are no enlarged abdominal or pelvic lymph nodes. Aortic and branch vessel atherosclerosis without evidence of aneurysm or large vessel occlusion. Reproductive: Status post hysterectomy. No evidence of adnexal mass. Other: Postsurgical changes in the anterior abdominal wall with stable small umbilical hernia containing only fat. No ascites or pneumoperitoneum. Musculoskeletal: No acute or significant osseous findings. Review of the MIP images confirms the above findings. IMPRESSION: 1. No evidence of acute pulmonary embolism or other acute vascular findings in the chest. 2. New part solid 6 mm nodule inferiorly in the right lower lobe and new 3 mm left upper lobe nodule, indeterminate. Follow-up non-contrast CT recommended at 3-6 months to confirm persistence. If unchanged, and solid component remains <6 mm, annual CT is recommended until 5  years of stability has been established. If persistent these nodules should be considered highly suspicious if the solid component of the nodule is 6 mm or greater in size and enlarging. This recommendation follows the consensus statement: Guidelines for Management of Incidental Pulmonary Nodules Detected on CT Images: From the Fleischner Society 2017; Radiology 2017; 284:228-243. 3. No acute findings in the abdomen or pelvis. 4. Stable postsurgical changes from subtotal colectomy with ileorectal anastomosis. The rectum is fluid-filled without significant distension, wall thickening or surrounding inflammation. 5. Aortic Atherosclerosis (ICD10-I70.0) and Emphysema (ICD10-J43.9). Electronically Signed   By: Elsie Perone M.D.   On: 01/26/2025 10:48   CT ABDOMEN PELVIS W CONTRAST Result Date: 01/26/2025 CLINICAL DATA:  history of DVT. Hypotension and tachy. Likely sepsis. Eval for source and PE; sepsis. Diarrhea. Pain in llq. Eval for diverticulitis EXAM: CT ANGIOGRAPHY CHEST CT ABDOMEN AND PELVIS WITH CONTRAST TECHNIQUE: Multidetector CT imaging of the chest was performed using the standard protocol during bolus administration of intravenous contrast. Multiplanar CT image reconstructions and MIPs were obtained to evaluate the vascular anatomy. Multidetector  CT imaging of the abdomen and pelvis was performed using the standard protocol during bolus administration of intravenous contrast. RADIATION DOSE REDUCTION: This exam was performed according to the departmental dose-optimization program which includes automated exposure control, adjustment of the mA and/or kV according to patient size and/or use of iterative reconstruction technique. CONTRAST:  75mL OMNIPAQUE  IOHEXOL  350 MG/ML SOLN COMPARISON:  Chest CT 05/19/2024 and 05/09/2024. Abdominopelvic CT 12/01/2023. FINDINGS: CTA CHEST FINDINGS Cardiovascular: The pulmonary arteries are well opacified with contrast to the level of the segmental branches. There is  no evidence of acute pulmonary embolism. There is central enlargement of the pulmonary arteries. Diffuse atherosclerosis of the aorta, great vessels and coronary arteries with calcifications of the aortic valve. The heart size is normal. There is no pericardial effusion. Mediastinum/Nodes: There are no enlarged mediastinal, hilar or axillary lymph nodes. The thyroid  gland, trachea and esophagus demonstrate no significant findings. Lungs/Pleura: No pleural effusion or pneumothorax. Mild centrilobular emphysema and central airway thickening. Interval improvement in previously demonstrated linear right lung atelectasis. There is a new part solid nodule inferiorly in the right lower lobe, measuring 6 x 6 mm on image 57/12. 3 mm left upper lobe nodule on image 37/12 appears new. No confluent airspace disease. Musculoskeletal/Chest wall: No chest wall mass or suspicious osseous findings. Multilevel spondylosis. CT ABDOMEN AND PELVIS FINDINGS Hepatobiliary: The liver is normal in density without suspicious focal abnormality. No significant biliary dilatation status post cholecystectomy. Pancreas: Unremarkable. No pancreatic ductal dilatation or surrounding inflammatory changes. Spleen: Stable splenic lobularity and scarring. No focal abnormality. Adrenals/Urinary Tract: Both adrenal glands appear normal. No evidence of urinary tract calculus, hydronephrosis or perinephric soft tissue stranding. The kidneys appear unchanged with cortical thinning and simple appearing cysts bilaterally for which no specific follow-up imaging is recommended. The urinary bladder is nearly empty and suboptimally evaluated. Stomach/Bowel: No enteric contrast administered. Stable small hiatal hernia. The stomach otherwise appears unremarkable for its degree of distention. Stable postsurgical changes from subtotal colectomy with ileorectal anastomosis. The rectum is fluid-filled without significant distension, wall thickening or surrounding  inflammation. Vascular/Lymphatic: There are no enlarged abdominal or pelvic lymph nodes. Aortic and branch vessel atherosclerosis without evidence of aneurysm or large vessel occlusion. Reproductive: Status post hysterectomy. No evidence of adnexal mass. Other: Postsurgical changes in the anterior abdominal wall with stable small umbilical hernia containing only fat. No ascites or pneumoperitoneum. Musculoskeletal: No acute or significant osseous findings. Review of the MIP images confirms the above findings. IMPRESSION: 1. No evidence of acute pulmonary embolism or other acute vascular findings in the chest. 2. New part solid 6 mm nodule inferiorly in the right lower lobe and new 3 mm left upper lobe nodule, indeterminate. Follow-up non-contrast CT recommended at 3-6 months to confirm persistence. If unchanged, and solid component remains <6 mm, annual CT is recommended until 5 years of stability has been established. If persistent these nodules should be considered highly suspicious if the solid component of the nodule is 6 mm or greater in size and enlarging. This recommendation follows the consensus statement: Guidelines for Management of Incidental Pulmonary Nodules Detected on CT Images: From the Fleischner Society 2017; Radiology 2017; 284:228-243. 3. No acute findings in the abdomen or pelvis. 4. Stable postsurgical changes from subtotal colectomy with ileorectal anastomosis. The rectum is fluid-filled without significant distension, wall thickening or surrounding inflammation. 5. Aortic Atherosclerosis (ICD10-I70.0) and Emphysema (ICD10-J43.9). Electronically Signed   By: Elsie Perone M.D.   On: 01/26/2025 10:48     .Ultrasound ED  Peripheral IV (Provider)  Date/Time: 01/26/2025 9:32 AM  Performed by: Simon Lavonia SAILOR, MD Authorized by: Simon Lavonia SAILOR, MD   Procedure details:    Indications: hypotension     Location:  Right AC   Angiocath:  18 G   Bedside Ultrasound Guided: Yes     Images: not  archived     Patient tolerated procedure without complications: Yes     Dressing applied: Yes   .Ultrasound ED Peripheral IV (Provider)  Date/Time: 01/26/2025 9:32 AM  Performed by: Simon Lavonia SAILOR, MD Authorized by: Simon Lavonia SAILOR, MD   Procedure details:    Indications: hypotension     Location:  Left AC   Angiocath:  20 G   Bedside Ultrasound Guided: Yes     Images: not archived     Patient tolerated procedure without complications: Yes     Dressing applied: Yes      Medications Ordered in the ED  norepinephrine  (LEVOPHED ) 4mg  in (0.016 mg/mL) premix infusion (7 mcg/min Intravenous Rate/Dose Change 01/26/25 1153)  vancomycin  (VANCOREADY) IVPB 2000 mg/400 mL (2,000 mg Intravenous New Bag/Given 01/26/25 1038)  lactated ringers  bolus 1,000 mL (0 mLs Intravenous Stopped 01/26/25 1039)  piperacillin -tazobactam (ZOSYN ) IVPB 3.375 g (0 g Intravenous Stopped 01/26/25 1010)  midodrine  (PROAMATINE ) tablet 10 mg (10 mg Oral Given 01/26/25 1031)  iohexol  (OMNIPAQUE ) 350 MG/ML injection 75 mL (75 mLs Intravenous Contrast Given 01/26/25 1013)  sodium chloride  0.9 % bolus 500 mL (500 mLs Intravenous New Bag/Given 01/26/25 1140)    Clinical Course as of 01/26/25 1210  Wed Jan 26, 2025  0942 Echo: FINDINGS   Left Ventricle: Left ventricular ejection fraction, by estimation, is 55  to 60%. The left ventricle has normal function. The left ventricle has no  regional wall motion abnormalities. The left ventricular internal cavity  size was normal in size. There is   moderate left ventricular hypertrophy. Left ventricular diastolic  parameters are consistent with Grade I diastolic dysfunction (impaired  relaxation).   Right Ventricle: The right ventricular size is normal. No increase in  right ventricular wall thickness. Right ventricular systolic function is  normal. There is normal pulmonary artery systolic pressure. The tricuspid  regurgitant velocity is 2.45 m/s, and   with an assumed right  atrial pressure of 3 mmHg, the estimated right  ventricular systolic pressure is 27.0 mmHg.   Left Atrium: Left atrial size was normal in size.   Right Atrium: Right atrial size was normal in size.   Pericardium: There is no evidence of pericardial effusion.   Mitral Valve: The mitral valve is normal in structure. There is mild  thickening of the mitral valve leaflet(s). Moderate mitral annular  calcification. Trivial mitral valve regurgitation. MV peak gradient, 14.6  mmHg. The mean mitral valve gradient is 5.5  mmHg.   Tricuspid Valve: The tricuspid valve is normal in structure. Tricuspid  valve regurgitation is trivial.   Aortic Valve: The aortic valve is calcified. There is severe calcifcation  of the aortic valve. Aortic valve regurgitation is mild. Mild aortic  stenosis is present. Aortic valve mean gradient measures 12.0 mmHg. Aortic  valve peak gradient measures 21.5  mmHg. Aortic valve area, by VTI measures 1.84 cm.   Pulmonic Valve: The pulmonic valve was not well visualized. Pulmonic valve  regurgitation is not visualized.   Aorta: The aortic root and ascending aorta are structurally normal, with  no evidence of dilitation.     [TL]    Clinical Course User  Index [TL] Simon Lavonia SAILOR, MD                                 Medical Decision Making Amount and/or Complexity of Data Reviewed Labs: ordered. Radiology: ordered.  Risk Prescription drug management. Decision regarding hospitalization.     HPI:   Patient presents because of weakness.  According to EMS, patient was at dialysis.  Received about 2 hours out of her 3-hour dialysis session and subsequently had to stop because of hypotension.  It sounds like she dropped her blood pressure sometimes during dialysis but usually responds well to fluid being administered back.  Patient received 500 cc of fluid dialysis but did not help with pressure.  Was found to have a systolic of 60.  Subsequently patient was  transported to the ED.  Patient states that she has been having a cough, nausea, some generalized weakness since approximately Saturday.  Missed Monday treatment.  Some generalized abdominal pain more burning sensation around her abdomen.  Is been compliant with all of her medications including Eliquis .  Chronic diarrhea.  No blood in her stool that she can appreciate.  No chest pain at this time.  Endorsing some chronic shortness of breath but nothing significantly worse.  She states that she no longer produces urine.   Previous medical history reviewed : Last admitted in May 2025.  DVT.  Chronic diastolic heart failure.  Atypical chest pain.  MDM:   Upon examination, patient hypertensive.  Able to tell me her name and year as well as location.  Patient maps initially in the 40s.  Subsequently placed patient in a supine position with MAP still hanging around the 40s to 50s.  Patient had already received 500 cc of fluid.  Given dialysis, started patient on another liter of fluid once IV access was obtained.  Difficult IV access.  She does have a left subclavian catheter site for dialysis.   Persistently low maps so started patient on peripheral norepinephrine .  Patient was started initially on 2.5 but increased to 5.  After liter fluid as well as patient continually be on 5 mcg of nor epi, maps improved.  Did order another 500 cc of fluid given that whenever she would sit up she would subsequent drop in blood pressure again.  Concern for sepsis.  Reviewed patient's chart.  History of MSSA bacteremia.  Has had to have her dialysis port changed a couple times because of this.  I think this is most likely the source.  Does not produce urine.  Given the fact that she was hypotensive as well as slightly tachycardic when she first arrived, did would like to obtain CTA chest rule out PE.  Will to rule out PE as well as infiltrate.  History of DVT.  On Eliquis .  No PE was seen on CT scan reassuring.  CT  abdomen pelvis obtained as well given hypotension and sepsis workup.  Evaluated for diverticulitis abscess versus other intra-abdominal pathology.  This was unremarkable.  Patient had already been started on broad-spectrum antibiotics.  Vancomycin  and Zosyn .   Reevaluation:   Upon reexamination, patient is more stable.  Still requiring norepinephrine .  No large electrolyte derangements that needs replacement at this point in time.  Elevated creatinine in the setting of dialysis.  Slightly acidotic.  Likely in setting of could not complete dialysis.  Patient will be admitted to ICU.  Sepsis.   Interventions: NE, vancomycin ,  zosyn , 1.5 L total of fluid, Midodrine  10 mg   EKG Interpreted by Me: sinus    Cardiac Tele Interpreted by Me: sinus    I have independently interpreted the CXR  and CT  images and agree with the radiologist finding   Social Determinant of Health: denies current smoking    Disposition and Follow Up: admit  CRITICAL CARE Performed by: Lavonia LOISE Pat   Total critical care time: 45 minutes  Critical care time was exclusive of separately billable procedures and treating other patients.  Critical care was necessary to treat or prevent imminent or life-threatening deterioration.  Critical care was time spent personally by me on the following activities: development of treatment plan with patient and/or surrogate as well as nursing, discussions with consultants, evaluation of patient's response to treatment, examination of patient, obtaining history from patient or surrogate, ordering and performing treatments and interventions, ordering and review of laboratory studies, ordering and review of radiographic studies, pulse oximetry and re-evaluation of patient's condition.       Final diagnoses:  Sepsis without acute organ dysfunction, due to unspecified organism [facility])  Hemodialysis-associated hypotension  Lactic acidosis    ED Discharge Orders     None           Pat Lavonia LOISE, MD 01/26/25 1641  "
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
## 2025-01-26 NOTE — Progress Notes (Signed)
 Pharmacy Antibiotic Note  Whitney Walker is a 69 y.o. female admitted on 01/26/2025 with bacteremia.  Pharmacy has been consulted for vancomycin /cefepime  dosing.  Plan: Vancomycin  1000mg  IV with HD (MWF) Cefepime  1g IV q24h Follow up HD plans and any changes for vancomycin  dosing  Weight: 92 kg (202 lb 13.2 oz)  Temp (24hrs), Avg:97.5 F (36.4 C), Min:97.5 F (36.4 C), Max:97.5 F (36.4 C)  Recent Labs  Lab 01/26/25 0931 01/26/25 0941 01/26/25 0942  WBC 11.6*  --   --   CREATININE  --  9.50*  --   LATICACIDVEN  --   --  2.4*    Estimated Creatinine Clearance: 5.8 mL/min (A) (by C-G formula based on SCr of 9.5 mg/dL (H)).    Allergies[1]  Antimicrobials this admission: Zosyn  IV 1/28 x1 Vancomycin  IV 1/28 x1  Microbiology results: 1/28 BCx: pending 1/28 MRSA PCR: pending  Thank you for allowing pharmacy to be a part of this patients care.  Elma Fail, PharmD PGY1 Clinical Pharmacist [facility]  01/26/2025 1:55 PM      [1]  Allergies Allergen Reactions   Difelikefalin Itching   Clindamycin /Lincomycin Diarrhea and Itching   Gadolinium Derivatives Hives and Other (See Comments)    HIVES, Desc: HIVES W/ DYE USED FOR 1ST CT SCAN BUT NOT 2ND, NO PREMEDS USED, PT UNCERTAIN OF CIRCUMSTANCES,,?POSSIBLE MRI CONTRAST ALLERGY, ALL STUDIES DONE SOMEWHERE IN [place] //A.C., Onset Date: [id number]

## 2025-01-26 NOTE — ED Provider Notes (Signed)
 Patient does not make urine.  She is appropriate for stat CT scan.  Do not need to wait on kidney function given the fact that she has no kidney function left.  She is chronically on dialysis.   Simon Lavonia SAILOR, MD 01/26/25 [phone]

## 2025-01-26 NOTE — ED Triage Notes (Addendum)
 Pt BIB [facility] from dialysis, completed 2 hours of treatment (normally goes for 3.5hours) before feeling very dizzy and weak. BP was 60/40. Dialysis says she normally drops into the 80-90's systolic and they have to give her fluids but not normally this low. Pt dizzy and lethargic on arrival. Dialysis center gave of fluid to help her pressure. Pt also dealing with a cough, nausea, generalized weakness since Saturday. Missed Monday treatment. AOX4. Dialysis port in chest.

## 2025-01-26 NOTE — Progress Notes (Signed)
 ED Pharmacy Antibiotic Sign Off An antibiotic consult was received from an ED provider for vancomycin  and zosyn  per pharmacy dosing for sepis. A chart review was completed to assess appropriateness.   Of note, ESRD on HD  The following one time order(s) were placed:  Vancomycin  2g Zosyn  3.375g over 30 min  Further antibiotic and/or antibiotic pharmacy consults should be ordered by the admitting provider if indicated.   Thank you for allowing pharmacy to be a part of this patient's care.   Leonor GORMAN Bash, [facility]  Clinical Pharmacist 01/26/25 9:13 AM

## 2025-01-26 NOTE — Consult Note (Signed)
 Reason for Consult: ESRD  Chief Complaint: Hypotension  Dialysis orders [facility] 3h 30 min MWF 2/2 400/600 EDW 90kg (recently has been leaving below this) Hb 14.8 1/21 not on ESA (last given 30mcg 09/13/24) Doxercalciferol    Assessment/Plan: ESRD -  on MWF.  Only got 2 hrs of treatment today and she's stating she doesn't want dialysis tomorrow, preferring to wait till Fri.  Will re-evaluate tomorrow and see if she's able to get her next HD on Fri; she's already below her EDW. Lengthy discussion again about permanent access which she continues to refuse; she will live and die with a catheter. She's had bad experiences with infiltrations previously.  Hypotension/volume  - BP in goal. Does not appear volume overloaded.  Under dry weight as mentioned above. Suspicious for infection; there was a small amount of pus expressed from the exit site.  Anemia of CKD - Hgb 16.7. No indication for ESA/iron  therapy with active infection; last ESA was given 09/13/24.   Secondary Hyperparathyroidism -  Ca in goal. Check phos. Continue VDRA and binders.   Nutrition - Renal diet w/fluid restrictions when advanced.  H/o COPD PAD s/p bypass Prior DVT/PE on Eliquis      HPI: Whitney Walker is an 69 y.o. female  ESRD on HD MWF at [facility].  Past medical history significant for HTN, chronic diastolic HF, COPD, CAD, PAD s/p left fem pop 2014, Hx colon cancer in remission, OSA not on CPAP, OA, anxiety/depression, Hx DVT + PE on AC and history of hyperkalemia.  Patient has had to shorten treatments the past 2 treatments including today receiving only approximately 2 hours out of her 3-1/2-hour planned and prescribed treatment because of hypotension.  This does occur occasionally but response to fluid but unfortunately at this time she received 500 milliliters of isotonic fluid with no improvement.  Patient was found diastolic pressure of 60 transported to the emergency department.  Patient has had a cough, nausea,  weakness since Saturday, missed dialysis on Monday and has had generalized abdominal pain.  Patient has chronic diarrhea but denies any dark black tarry stool or hematochezia.  Patient does have some shortness of breath which is chronic and not any worse than usual.  Patient was given another fluid bolus in the emergency room and started on Levophed  .  CT of the chest abdomen pelvis was performed showing a solid 6 mm nodule inferiorly in the right lower lobe as well as a new left upper lobe nodule.  Changes with prior subtotal colectomy with no distention or wall thickening/inflammation.  Patient with noted discomfort around the catheter insertion site for a few days.    ROS Pertinent items are noted in HPI.  Chemistry and CBC: Creatinine  Date/Time Value Ref Range Status  06/22/2024 12:22 PM 8.16 (HH) 0.44 - 1.00 mg/dL Final    Comment:    REPEATED TO VERIFY CRITICAL RESULT CALLED TO, READ BACK BY AND VERIFIED WITH: Anders Seena PEAK at 1332 by Aldona [name] (MT) 06/22/2024    05/22/2023 11:27 AM 7.43 (HH) 0.44 - 1.00 mg/dL Final    Comment:    REPEATED TO VERIFY CRITICAL RESULT CALLED TO, READ BACK BY AND VERIFIED WITH: Anders Seena PEAK at 1227 pm by Aldona [name] (MT) 05/22/2023    04/21/2019 12:00 AM 10.7 (A) 0.5 - 1.1 Final  06/04/2018 12:00 AM 5.7 (A) 0.5 - 1.1 Final  07/07/2017 12:35 PM 3.4 (HH) 0.6 - 1.1 mg/dL Final  98/94/7981 90:70 AM 3.4 Repeated and Verified (HH) 0.6 -  1.1 mg/dL Final  91/92/7982 87:64 PM 2.6 (H) 0.6 - 1.1 mg/dL Final  98/86/7983 96:79 PM 1.9 (H) 0.6 - 1.1 mg/dL Final  89/68/7985 87:58 PM 1.6 (H) 0.6 - 1.1 mg/dL Final  87/93/7986 89:43 AM 1.7 (H) 0.6 - 1.1 mg/dL Final   Creatinine, Ser  Date/Time Value Ref Range Status  01/26/2025 09:41 AM 9.50 (H) 0.44 - 1.00 mg/dL Final  94/77/7974 93:83 AM 7.11 (H) 0.44 - 1.00 mg/dL Final    Comment:    DELTA CHECK NOTED  05/19/2024 10:19 AM 4.59 (H) 0.44 - 1.00 mg/dL Final  94/85/7974 93:53 AM 10.79 (H) 0.44 - 1.00  mg/dL Final  94/88/7974 92:79 AM 8.84 (H) 0.44 - 1.00 mg/dL Final  98/69/7974 97:42 PM 7.35 (H) 0.44 - 1.00 mg/dL Final  87/96/7975 93:91 AM 7.27 (H) 0.44 - 1.00 mg/dL Final  87/97/7975 90:69 PM 13.13 (H) 0.44 - 1.00 mg/dL Final  87/97/7975 98:94 PM 13.38 (H) 0.44 - 1.00 mg/dL Final  95/90/7975 93:56 PM 9.81 (H) 0.44 - 1.00 mg/dL Final  87/87/7976 87:99 PM 9.10 (H) 0.44 - 1.00 mg/dL Final  93/98/7976 95:95 PM 7.91 (H) 0.57 - 1.00 mg/dL Final  94/77/7976 98:68 AM 9.12 (H) 0.44 - 1.00 mg/dL Final  94/78/7976 91:92 PM 9.05 (H) 0.44 - 1.00 mg/dL Final    Comment:    DELTA CHECK NOTED  05/18/2022 04:33 AM 5.49 (H) 0.44 - 1.00 mg/dL Final    Comment:    DELTA CHECK NOTED  05/17/2022 12:47 AM 9.04 (H) 0.44 - 1.00 mg/dL Final  94/81/7976 97:92 PM 8.21 (H) 0.44 - 1.00 mg/dL Final  94/94/7976 98:81 AM 10.62 (H) 0.44 - 1.00 mg/dL Final  94/96/7976 89:80 PM 7.90 (H) 0.44 - 1.00 mg/dL Final  94/96/7976 98:42 PM 15.55 (H) 0.44 - 1.00 mg/dL Final  94/96/7976 91:89 AM 15.23 (H) 0.44 - 1.00 mg/dL Final  88/82/7977 88:54 AM 6.96 (H) 0.57 - 1.00 mg/dL Final  92/75/7977 87:53 AM 6.33 (H) 0.44 - 1.00 mg/dL Final  92/76/7977 98:79 AM 9.47 (H) 0.44 - 1.00 mg/dL Final  92/77/7977 98:50 AM 8.09 (H) 0.44 - 1.00 mg/dL Final  92/79/7977 88:54 PM 5.80 (H) 0.44 - 1.00 mg/dL Final    Comment:    DELTA CHECK NOTED  07/18/2021 02:30 PM 8.91 (H) 0.44 - 1.00 mg/dL Final  92/80/7977 95:91 AM 7.20 (H) 0.44 - 1.00 mg/dL Final  92/81/7977 97:97 PM 7.31 (H) 0.44 - 1.00 mg/dL Final  93/97/7977 97:72 AM 5.98 (H) 0.44 - 1.00 mg/dL Final    Comment:    DELTA CHECK NOTED  05/30/2021 01:19 AM 9.84 (H) 0.44 - 1.00 mg/dL Final  94/68/7977 87:61 AM 7.54 (H) 0.44 - 1.00 mg/dL Final    Comment:    DELTA CHECK NOTED DIALYSIS   05/28/2021 08:42 AM 12.78 (H) 0.44 - 1.00 mg/dL Final  94/80/7977 87:80 PM 8.60 (HH) 0.44 - 1.00 mg/dL Final    Comment:    CRITICAL RESULT CALLED TO, READ BACK BY AND VERIFIED WITH: karen beach at  1308 by hflynt 05/17/21   04/25/2021 11:28 AM 4.34 (H) 0.57 - 1.00 mg/dL Final    Comment:    **Verified by repeat analysis**  03/15/2021 11:43 AM 6.27 (H) 0.57 - 1.00 mg/dL Final  97/92/7977 96:58 PM 4.81 (H) 0.44 - 1.00 mg/dL Final  88/96/7978 87:64 AM 10.69 (H) 0.44 - 1.00 mg/dL Final  88/97/7978 87:72 AM 8.04 (H) 0.44 - 1.00 mg/dL Final    Comment:    DELTA CHECK NOTED  10/30/2020 04:07 AM  12.21 (H) 0.44 - 1.00 mg/dL Final  94/79/7978 89:63 AM 6.98 (HH) 0.44 - 1.00 mg/dL Final    Comment:    CRITICAL RESULT CALLED TO, READ BACK BY AND VERIFIED WITH: karen beach by hflynt 05/18/20   05/10/2020 06:31 AM 8.44 (H) 0.44 - 1.00 mg/dL Final  94/88/7978 87:53 PM 5.80 (H) 0.44 - 1.00 mg/dL Final    Comment:    DELTA CHECK NOTED DIALYSIS   05/09/2020 07:08 AM 13.59 (H) 0.44 - 1.00 mg/dL Final  94/88/7978 87:94 AM 13.20 (H) 0.44 - 1.00 mg/dL Final  88/80/7979 87:60 PM 8.25 (HH) 0.44 - 1.00 mg/dL Final    Comment:    CRITICAL RESULT CALLED TO, READ BACK BY AND VERIFIED WITH: Mazie Boyer, RN at 1334 by CHRISTELLA Rattler   03/03/2019 05:14 AM 6.40 (H) 0.44 - 1.00 mg/dL Final    Comment:    DELTA CHECK NOTED  03/02/2019 04:35 PM 15.49 (H) 0.44 - 1.00 mg/dL Final  97/81/7979 98:85 PM 6.45 (HH) 0.44 - 1.00 mg/dL Final    Comment:    CRITICAL RESULT CALLED TO, READ BACK BY AND VERIFIED WITH: CHERYL ANDERSON,RN    06/11/2018 12:41 PM 6.05 (HH) 0.60 - 1.10 mg/dL Final    Comment:    CRITICAL RESULT CALLED TO, READ BACK BY AND VERIFIED WITH: SHERILL   05/07/2018 04:26 AM 8.95 (H) 0.44 - 1.00 mg/dL Final  94/91/7980 95:75 AM 6.99 (H) 0.44 - 1.00 mg/dL Final   Recent Labs  Lab 01/26/25 0941  NA 133*  K 4.8  CL 103  GLUCOSE 92  BUN 41*  CREATININE 9.50*   Recent Labs  Lab 01/26/25 0931 01/26/25 0941  WBC 11.6*  --   NEUTROABS 8.7*  --   HGB 14.7 16.7*  HCT 44.8 49.0*  MCV 90.0  --   PLT 150  --    Liver Function Tests: No results for input(s): AST, ALT, ALKPHOS, BILITOT,  PROT, ALBUMIN  in the last 168 hours. No results for input(s): LIPASE, AMYLASE in the last 168 hours. No results for input(s): AMMONIA in the last 168 hours. Cardiac Enzymes: No results for input(s): CKTOTAL, CKMB, CKMBINDEX, TROPONINI in the last 168 hours. Iron  Studies: No results for input(s): IRON , TIBC, TRANSFERRIN, FERRITIN in the last 72 hours. PT/INR: @LABRCNTIP (inr:5)  Xrays/Other Studies: ) Results for orders placed or performed during the hospital encounter of 01/26/25 (from the past 48 hours)  Resp panel by RT-PCR (RSV, Flu A&B, Covid) Anterior Nasal Swab     Status: None   Collection Time: 01/26/25  9:08 AM   Specimen: Anterior Nasal Swab  Result Value Ref Range   SARS Coronavirus 2 by RT PCR NEGATIVE NEGATIVE   Influenza A by PCR NEGATIVE NEGATIVE   Influenza B by PCR NEGATIVE NEGATIVE    Comment: (NOTE) The Xpert Xpress SARS-CoV-2/FLU/RSV plus assay is intended as an aid in the diagnosis of influenza from Nasopharyngeal swab specimens and should not be used as a sole basis for treatment. Nasal washings and aspirates are unacceptable for Xpert Xpress SARS-CoV-2/FLU/RSV testing.  Fact Sheet for Patients: bloggercourse.com  Fact Sheet for Healthcare Providers: seriousbroker.it  This test is not yet approved or cleared by the [place]  FDA and has been authorized for detection and/or diagnosis of SARS-CoV-2 by FDA under an Emergency Use Authorization (EUA). This EUA will remain in effect (meaning this test can be used) for the duration of the COVID-19 declaration under Section 564(b)(1) of the Act, 21 U.S.C. section 360bbb-3(b)(1), unless the authorization  is terminated or revoked.     Resp Syncytial Virus by PCR NEGATIVE NEGATIVE    Comment: (NOTE) Fact Sheet for Patients: bloggercourse.com  Fact Sheet for Healthcare  Providers: seriousbroker.it  This test is not yet approved or cleared by the [place]  FDA and has been authorized for detection and/or diagnosis of SARS-CoV-2 by FDA under an Emergency Use Authorization (EUA). This EUA will remain in effect (meaning this test can be used) for the duration of the COVID-19 declaration under Section 564(b)(1) of the Act, 21 U.S.C. section 360bbb-3(b)(1), unless the authorization is terminated or revoked.  Performed at [facility], [address] [place], [place] [address]   CBC with Differential     Status: Abnormal   Collection Time: 01/26/25  9:31 AM  Result Value Ref Range   WBC 11.6 (H) 4.0 - 10.5 K/uL   RBC 4.98 3.87 - 5.11 MIL/uL   Hemoglobin 14.7 12.0 - 15.0 g/dL   HCT 55.1 63.9 - 53.9 %   MCV 90.0 80.0 - 100.0 fL   MCH 29.5 26.0 - 34.0 pg   MCHC 32.8 30.0 - 36.0 g/dL   RDW 84.7 88.4 - 84.4 %   Platelets 150 150 - 400 K/uL   nRBC 0.0 0.0 - 0.2 %   Neutrophils Relative % 75 %   Neutro Abs 8.7 (H) 1.7 - 7.7 K/uL   Lymphocytes Relative 15 %   Lymphs Abs 1.8 0.7 - 4.0 K/uL   Monocytes Relative 7 %   Monocytes Absolute 0.8 0.1 - 1.0 K/uL   Eosinophils Relative 1 %   Eosinophils Absolute 0.1 0.0 - 0.5 K/uL   Basophils Relative 0 %   Basophils Absolute 0.1 0.0 - 0.1 K/uL   Immature Granulocytes 2 %   Abs Immature Granulocytes 0.17 (H) 0.00 - 0.07 K/uL    Comment: Performed at [facility], [address] [place], [place] [address]  I-stat chem 8, ED (not at [facility], [facility] or [facility])     Status: Abnormal   Collection Time: 01/26/25  9:41 AM  Result Value Ref Range   Sodium 133 (L) 135 - 145 mmol/L   Potassium 4.8 3.5 - 5.1 mmol/L   Chloride 103 98 - 111 mmol/L   BUN 41 (H) 8 - 23 mg/dL   Creatinine, Ser 0.49 (H) 0.44 - 1.00 mg/dL   Glucose, Bld 92 70 - 99 mg/dL    Comment: Glucose reference range applies only to samples taken after fasting for at least 8 hours.   Calcium , Ion 0.92 (L) 1.15 - 1.40  mmol/L   TCO2 18 (L) 22 - 32 mmol/L   Hemoglobin 16.7 (H) 12.0 - 15.0 g/dL   HCT 50.9 (H) 63.9 - 53.9 %  I-Stat CG4 Lactic Acid     Status: Abnormal   Collection Time: 01/26/25  9:42 AM  Result Value Ref Range   Lactic Acid, Venous 2.4 (HH) 0.5 - 1.9 mmol/L   Comment NOTIFIED PHYSICIAN   Glucose, capillary     Status: None   Collection Time: 01/26/25  2:02 PM  Result Value Ref Range   Glucose-Capillary 93 70 - 99 mg/dL    Comment: Glucose reference range applies only to samples taken after fasting for at least 8 hours.   *Note: Due to a large number of results and/or encounters for the requested time period, some results have not been displayed. A complete set of results can be found in Results Review.   DG Chest Portable  1 View Result Date: 01/26/2025 CLINICAL DATA:  Sepsis. EXAM: PORTABLE CHEST 1 VIEW COMPARISON:  05/19/2024 been CT chest 01/26/2025. FINDINGS: Trachea is midline. Heart size stable. Left IJ dialysis catheter tips are in the SVC and right atrium. Mild basilar streaky opacification. No pleural fluid. IMPRESSION: Minimal bibasilar subsegmental atelectasis. Electronically Signed   By: Newell Eke M.D.   On: 01/26/2025 11:34   CT Angio Chest PE W and/or Wo Contrast Result Date: 01/26/2025 CLINICAL DATA:  history of DVT. Hypotension and tachy. Likely sepsis. Eval for source and PE; sepsis. Diarrhea. Pain in llq. Eval for diverticulitis EXAM: CT ANGIOGRAPHY CHEST CT ABDOMEN AND PELVIS WITH CONTRAST TECHNIQUE: Multidetector CT imaging of the chest was performed using the standard protocol during bolus administration of intravenous contrast. Multiplanar CT image reconstructions and MIPs were obtained to evaluate the vascular anatomy. Multidetector CT imaging of the abdomen and pelvis was performed using the standard protocol during bolus administration of intravenous contrast. RADIATION DOSE REDUCTION: This exam was performed according to the departmental dose-optimization program  which includes automated exposure control, adjustment of the mA and/or kV according to patient size and/or use of iterative reconstruction technique. CONTRAST:  75mL OMNIPAQUE  IOHEXOL  350 MG/ML SOLN COMPARISON:  Chest CT 05/19/2024 and 05/09/2024. Abdominopelvic CT 12/01/2023. FINDINGS: CTA CHEST FINDINGS Cardiovascular: The pulmonary arteries are well opacified with contrast to the level of the segmental branches. There is no evidence of acute pulmonary embolism. There is central enlargement of the pulmonary arteries. Diffuse atherosclerosis of the aorta, great vessels and coronary arteries with calcifications of the aortic valve. The heart size is normal. There is no pericardial effusion. Mediastinum/Nodes: There are no enlarged mediastinal, hilar or axillary lymph nodes. The thyroid  gland, trachea and esophagus demonstrate no significant findings. Lungs/Pleura: No pleural effusion or pneumothorax. Mild centrilobular emphysema and central airway thickening. Interval improvement in previously demonstrated linear right lung atelectasis. There is a new part solid nodule inferiorly in the right lower lobe, measuring 6 x 6 mm on image 57/12. 3 mm left upper lobe nodule on image 37/12 appears new. No confluent airspace disease. Musculoskeletal/Chest wall: No chest wall mass or suspicious osseous findings. Multilevel spondylosis. CT ABDOMEN AND PELVIS FINDINGS Hepatobiliary: The liver is normal in density without suspicious focal abnormality. No significant biliary dilatation status post cholecystectomy. Pancreas: Unremarkable. No pancreatic ductal dilatation or surrounding inflammatory changes. Spleen: Stable splenic lobularity and scarring. No focal abnormality. Adrenals/Urinary Tract: Both adrenal glands appear normal. No evidence of urinary tract calculus, hydronephrosis or perinephric soft tissue stranding. The kidneys appear unchanged with cortical thinning and simple appearing cysts bilaterally for which no  specific follow-up imaging is recommended. The urinary bladder is nearly empty and suboptimally evaluated. Stomach/Bowel: No enteric contrast administered. Stable small hiatal hernia. The stomach otherwise appears unremarkable for its degree of distention. Stable postsurgical changes from subtotal colectomy with ileorectal anastomosis. The rectum is fluid-filled without significant distension, wall thickening or surrounding inflammation. Vascular/Lymphatic: There are no enlarged abdominal or pelvic lymph nodes. Aortic and branch vessel atherosclerosis without evidence of aneurysm or large vessel occlusion. Reproductive: Status post hysterectomy. No evidence of adnexal mass. Other: Postsurgical changes in the anterior abdominal wall with stable small umbilical hernia containing only fat. No ascites or pneumoperitoneum. Musculoskeletal: No acute or significant osseous findings. Review of the MIP images confirms the above findings. IMPRESSION: 1. No evidence of acute pulmonary embolism or other acute vascular findings in the chest. 2. New part solid 6 mm nodule inferiorly in the right lower lobe and  new 3 mm left upper lobe nodule, indeterminate. Follow-up non-contrast CT recommended at 3-6 months to confirm persistence. If unchanged, and solid component remains <6 mm, annual CT is recommended until 5 years of stability has been established. If persistent these nodules should be considered highly suspicious if the solid component of the nodule is 6 mm or greater in size and enlarging. This recommendation follows the consensus statement: Guidelines for Management of Incidental Pulmonary Nodules Detected on CT Images: From the Fleischner Society 2017; Radiology 2017; 284:228-243. 3. No acute findings in the abdomen or pelvis. 4. Stable postsurgical changes from subtotal colectomy with ileorectal anastomosis. The rectum is fluid-filled without significant distension, wall thickening or surrounding inflammation. 5. Aortic  Atherosclerosis (ICD10-I70.0) and Emphysema (ICD10-J43.9). Electronically Signed   By: Elsie Perone M.D.   On: 01/26/2025 10:48   CT ABDOMEN PELVIS W CONTRAST Result Date: 01/26/2025 CLINICAL DATA:  history of DVT. Hypotension and tachy. Likely sepsis. Eval for source and PE; sepsis. Diarrhea. Pain in llq. Eval for diverticulitis EXAM: CT ANGIOGRAPHY CHEST CT ABDOMEN AND PELVIS WITH CONTRAST TECHNIQUE: Multidetector CT imaging of the chest was performed using the standard protocol during bolus administration of intravenous contrast. Multiplanar CT image reconstructions and MIPs were obtained to evaluate the vascular anatomy. Multidetector CT imaging of the abdomen and pelvis was performed using the standard protocol during bolus administration of intravenous contrast. RADIATION DOSE REDUCTION: This exam was performed according to the departmental dose-optimization program which includes automated exposure control, adjustment of the mA and/or kV according to patient size and/or use of iterative reconstruction technique. CONTRAST:  75mL OMNIPAQUE  IOHEXOL  350 MG/ML SOLN COMPARISON:  Chest CT 05/19/2024 and 05/09/2024. Abdominopelvic CT 12/01/2023. FINDINGS: CTA CHEST FINDINGS Cardiovascular: The pulmonary arteries are well opacified with contrast to the level of the segmental branches. There is no evidence of acute pulmonary embolism. There is central enlargement of the pulmonary arteries. Diffuse atherosclerosis of the aorta, great vessels and coronary arteries with calcifications of the aortic valve. The heart size is normal. There is no pericardial effusion. Mediastinum/Nodes: There are no enlarged mediastinal, hilar or axillary lymph nodes. The thyroid  gland, trachea and esophagus demonstrate no significant findings. Lungs/Pleura: No pleural effusion or pneumothorax. Mild centrilobular emphysema and central airway thickening. Interval improvement in previously demonstrated linear right lung atelectasis. There  is a new part solid nodule inferiorly in the right lower lobe, measuring 6 x 6 mm on image 57/12. 3 mm left upper lobe nodule on image 37/12 appears new. No confluent airspace disease. Musculoskeletal/Chest wall: No chest wall mass or suspicious osseous findings. Multilevel spondylosis. CT ABDOMEN AND PELVIS FINDINGS Hepatobiliary: The liver is normal in density without suspicious focal abnormality. No significant biliary dilatation status post cholecystectomy. Pancreas: Unremarkable. No pancreatic ductal dilatation or surrounding inflammatory changes. Spleen: Stable splenic lobularity and scarring. No focal abnormality. Adrenals/Urinary Tract: Both adrenal glands appear normal. No evidence of urinary tract calculus, hydronephrosis or perinephric soft tissue stranding. The kidneys appear unchanged with cortical thinning and simple appearing cysts bilaterally for which no specific follow-up imaging is recommended. The urinary bladder is nearly empty and suboptimally evaluated. Stomach/Bowel: No enteric contrast administered. Stable small hiatal hernia. The stomach otherwise appears unremarkable for its degree of distention. Stable postsurgical changes from subtotal colectomy with ileorectal anastomosis. The rectum is fluid-filled without significant distension, wall thickening or surrounding inflammation. Vascular/Lymphatic: There are no enlarged abdominal or pelvic lymph nodes. Aortic and branch vessel atherosclerosis without evidence of aneurysm or large vessel occlusion. Reproductive: Status post hysterectomy.  No evidence of adnexal mass. Other: Postsurgical changes in the anterior abdominal wall with stable small umbilical hernia containing only fat. No ascites or pneumoperitoneum. Musculoskeletal: No acute or significant osseous findings. Review of the MIP images confirms the above findings. IMPRESSION: 1. No evidence of acute pulmonary embolism or other acute vascular findings in the chest. 2. New part solid 6  mm nodule inferiorly in the right lower lobe and new 3 mm left upper lobe nodule, indeterminate. Follow-up non-contrast CT recommended at 3-6 months to confirm persistence. If unchanged, and solid component remains <6 mm, annual CT is recommended until 5 years of stability has been established. If persistent these nodules should be considered highly suspicious if the solid component of the nodule is 6 mm or greater in size and enlarging. This recommendation follows the consensus statement: Guidelines for Management of Incidental Pulmonary Nodules Detected on CT Images: From the Fleischner Society 2017; Radiology 2017; 284:228-243. 3. No acute findings in the abdomen or pelvis. 4. Stable postsurgical changes from subtotal colectomy with ileorectal anastomosis. The rectum is fluid-filled without significant distension, wall thickening or surrounding inflammation. 5. Aortic Atherosclerosis (ICD10-I70.0) and Emphysema (ICD10-J43.9). Electronically Signed   By: Elsie Perone M.D.   On: 01/26/2025 10:48    PMH:   Past Medical History:  Diagnosis Date   Acute left-sided low back pain without sciatica 02/22/2024   Anemia    Anxiety    CHF (congestive heart failure) (HCC)    Colon cancer (HCC)    treatment surgery   Complication of anesthesia    after first [place]- [place] couldnt walk after, patient denies having a spinal   COPD (chronic obstructive pulmonary disease) (HCC)    Coronary artery disease    Depression    DVT (deep venous thrombosis) (HCC)    Encounter for annual health examination 08/05/2023   ESRD on hemodialysis (HCC)    Hemo: MWF   History of blood transfusion 04/2018   Hypertension    07/07/18- no longer takes BP medications   Left hip pain 05/16/2022   Left leg pain 05/09/2020   Leucocytosis 04/28/2012   Meningitis    Orthostatic hypotension 05/16/2022   Pain in limb 07/30/2013   PE (pulmonary embolism)    Peripheral vascular disease    Primary hypertension 11/15/2013   Rash  11/25/2023   Restless legs    Shortness of breath    with exertion   Sleep apnea    SOB (shortness of breath) 03/03/2019   Vertigo     PSH:   Past Surgical History:  Procedure Laterality Date   ABDOMINAL AORTAGRAM N/A 07/26/2013   Procedure: ABDOMINAL EZELLA;  Surgeon: Redell LITTIE Door, MD;  Location: [facility] CATH LAB;  Service: Cardiovascular;  Laterality: N/A;   ABDOMINAL HYSTERECTOMY     AV FISTULA PLACEMENT Left 05/05/2018   Procedure: ARTERIOVENOUS (AV) FISTULA CREATION BRACHIOCEPHALIC;  Surgeon: Sheree Penne Bruckner, MD;  Location[facility];  Service: Vascular;  Laterality: Left;   AV FISTULA PLACEMENT Left 07/16/2018   Procedure: ARTERIOVENOUS FISTULA CREATION;  Surgeon: Sheree Penne Bruckner, MD;  Location[facility] OR;  Service: Vascular;  Laterality: Left;   BASCILIC VEIN TRANSPOSITION Left 09/03/2018   Procedure: BASILIC VEIN TRANSPOSITION SECOND STAGE;  Surgeon: Sheree Penne Bruckner, MD;  Location: [facility] OR;  Service: Vascular;  Laterality: Left;   BREAST BIOPSY Left    CESAREAN SECTION     X 3 1974-1977   CHOLECYSTECTOMY     CHOLECYSTECTOMY  1980/s   COLECTOMY  2010  DIVERTICULOSIS SURGERY-2002  2012   FEMORAL-POPLITEAL BYPASS GRAFT Left 08/13/2013   Procedure: BYPASS GRAFT FEMORAL-POPLITEAL ARTERY WITH NON-REVERSED SAPHANEOUS VEIN; ULTRASOUND GUIDED;  Surgeon: Lynwood JONETTA Collum, MD;  Location: [facility] OR;  Service: Vascular;  Laterality: Left;   FLEXIBLE SIGMOIDOSCOPY N/A 12/10/2022   Procedure: FLEXIBLE SIGMOIDOSCOPY;  Surgeon: Rollin Dover, MD;  Location: [facility];  Service: Gastroenterology;  Laterality: N/A;   INSERTION OF DIALYSIS CATHETER Right 04/30/2018   Procedure: INSERTION OF Right Internal Jugular DIALYSIS CATHETER;  Surgeon: Serene Gaile ORN, MD;  Location: [facility] OR;  Service: Vascular;  Laterality: Right;   IR FLUORO GUIDE CV LINE LEFT  05/01/2022   IR FLUORO GUIDE CV LINE LEFT  05/21/2022   IR FLUORO GUIDE CV LINE RIGHT  07/24/2020   IR FLUORO GUIDE CV LINE RIGHT   07/21/2021   IR FLUORO GUIDE CV LINE RIGHT  05/20/2022   IR PTA VENOUS EXCEPT DIALYSIS CIRCUIT  05/21/2022   IR REMOVAL TUN CV CATH W/O FL  07/21/2020   IR REMOVAL TUN CV CATH W/O FL  07/19/2021   IR REMOVAL TUN CV CATH W/O FL  05/17/2022   IR US  GUIDE VASC ACCESS LEFT  05/21/2022   IR US  GUIDE VASC ACCESS RIGHT  07/24/2020   IR US  GUIDE VASC ACCESS RIGHT  07/21/2021   IR US  GUIDE VASC ACCESS RIGHT  05/20/2022   IR VENOCAVAGRAM SVC  05/21/2022   LEFT HEART CATH AND CORONARY ANGIOGRAPHY N/A 05/01/2021   Procedure: LEFT HEART CATH AND CORONARY ANGIOGRAPHY;  Surgeon: Ladona Heinz, MD;  Location: [facility] INVASIVE CV LAB;  Service: Cardiovascular;  Laterality: N/A;   LEFT HEART CATH AND CORONARY ANGIOGRAPHY N/A 05/02/2022   Procedure: LEFT HEART CATH AND CORONARY ANGIOGRAPHY;  Surgeon: Ladona Heinz, MD;  Location: [facility] INVASIVE CV LAB;  Service: Cardiovascular;  Laterality: N/A;   LOWER EXTREMITY ANGIOGRAM Left 07/26/2013   Procedure: LOWER EXTREMITY ANGIOGRAM;  Surgeon: Redell LITTIE Door, MD;  Location: [facility] CATH LAB;  Service: Cardiovascular;  Laterality: Left;    Allergies: Allergies[1]  Medications:   Prior to Admission medications  Medication Sig Start Date End Date Taking? Authorizing Provider  acetaminophen  (TYLENOL ) 650 MG CR tablet Take 1,950 mg by mouth every 8 (eight) hours as needed for pain.   Yes [provider]  albuterol  (VENTOLIN  HFA) 108 (90 Base) MCG/ACT inhaler Inhale 2 puffs into the lungs every 6 (six) hours as needed for wheezing or shortness of breath. 12/17/24  Yes Georgina Speaks, FNP  atorvastatin  (LIPITOR) 20 MG tablet Take 1 tablet (20 mg total) by mouth daily. 12/17/24 01/26/25 Yes Georgina Speaks, FNP  Biotin  5 MG CAPS Take 10 mg by mouth daily at 6 (six) AM.   Yes [provider]  ELIQUIS  5 MG TABS tablet TAKE 1 TABLET (5 MG TOTAL) BY MOUTH 2 (TWO) TIMES DAILY. 11/22/24  Yes Georgina Speaks, FNP  ezetimibe  (ZETIA ) 10 MG tablet Take 1 tablet (10 mg total) by mouth daily. 12/02/24  03/02/25 Yes Ladona Heinz, MD  nicotine  (NICODERM CQ  - DOSED IN MG/24 HOURS) 21 mg/24hr patch Place 1 patch (21 mg total) onto the skin daily. 01/04/25 01/04/26 Yes Georgina Speaks, FNP  omeprazole  (PRILOSEC) 20 MG capsule TAKE 1 CAPSULE (20 MG TOTAL) BY MOUTH DAILY. 11/22/24  Yes Georgina Speaks, FNP  sevelamer  carbonate (RENVELA ) 800 MG tablet Take 800 mg by mouth at bedtime. 10/27/23  Yes [provider]  sodium zirconium cyclosilicate  (LOKELMA ) 10 g PACK packet Take 10 g by mouth daily.   Yes  [provider]  vitamin E  200 UNIT capsule Take 200 Units by mouth daily.   Yes [provider]  meclizine  (ANTIVERT ) 12.5 MG tablet Take 1 tablet (12.5 mg total) by mouth 3 (three) times daily as needed for dizziness. 09/18/23   Georgina Speaks, FNP  midodrine  (PROAMATINE ) 5 MG tablet Take 1 tablet (5 mg total) by mouth 2 (two) times daily as needed (hypotension). Take one tab before dialysis Patient not taking: Reported on 01/26/2025 12/02/24   Ladona Heinz, MD  multivitamin (RENA-VIT) TABS tablet Take 1 tablet by mouth at bedtime. Patient not taking: Reported on 01/26/2025    [provider]  Tenapanor  HCl, CKD, 30 MG TABS Take 1 tablet by mouth daily. Patient not taking: Reported on 01/26/2025    [provider]  Tiotropium Bromide-Olodaterol (STIOLTO RESPIMAT ) 2.5-2.5 MCG/ACT AERS Inhale 2 puffs into the lungs daily. Patient not taking: Reported on 01/26/2025 05/04/24   Hunsucker, Donnice SAUNDERS, MD  vitamin C  (ASCORBIC ACID ) 250 MG tablet Take 250 mg by mouth daily. Patient not taking: Reported on 01/26/2025    [provider]    Discontinued Meds:   Medications Discontinued During This Encounter  Medication Reason   norepinephrine  (LEVOPHED ) 4mg  in (0.016 mg/mL) premix infusion     Social History:  reports that she has been smoking cigarettes. She started smoking about 52 years ago. She has a 25 pack-year smoking history. She has been exposed to tobacco smoke.  She has quit using smokeless tobacco. She reports that she does not currently use alcohol. She reports that she does not currently use drugs after having used the following drugs: Oxycodone .  Family History:   Family History  Problem Relation Age of Onset   Cancer Mother 54       breast and bone   Cancer Father 32       prostate   Hypertension Sister    Bleeding Disorder Sister    Hypertension Daughter    Cancer Cousin 20       breast cancer    Congestive Heart Failure Son    Breast cancer Neg Hx     Blood pressure (!) 130/57, pulse (!) 104, temperature (!) 97.5 F (36.4 C), temperature source Oral, resp. rate 19, height 5' 2 (1.575 m), weight 90.1 kg, SpO2 98%. General: WDWN in NAD Head: NCAT sclera not icteric MMM Neck: Supple. No lymphadenopathy Lungs: CTA bilaterally. No wheeze, rales or rhonchi. Breathing is unlabored on RA Heart: RRR. No murmur, rubs or gallops.  Abdomen: soft, +epigastric tenderness, non distended Lower extremities:no edema, ischemic changes, or open wounds  Neuro: AAOx3. Moves all extremities spontaneously. Psych:  Responds to questions appropriately with a normal affect. Dialysis Access: [name], very small amount of pus expressed from exit site         MELIA LYNWOOD ORN, MD 01/26/2025, 3:37 PM      [1]  Allergies Allergen Reactions   Difelikefalin Itching   Clindamycin /Lincomycin Diarrhea and Itching   Gadolinium Derivatives Hives and Other (See Comments)    HIVES, Desc: HIVES W/ DYE USED FOR 1ST CT SCAN BUT NOT 2ND, NO PREMEDS USED, PT UNCERTAIN OF CIRCUMSTANCES,,?POSSIBLE MRI CONTRAST ALLERGY, ALL STUDIES DONE SOMEWHERE IN [place] //A.C., Onset Date: [id number]

## 2025-01-27 ENCOUNTER — Inpatient Hospital Stay (HOSPITAL_COMMUNITY)

## 2025-01-27 DIAGNOSIS — R6521 Severe sepsis with septic shock: Secondary | ICD-10-CM | POA: Diagnosis not present

## 2025-01-27 DIAGNOSIS — D631 Anemia in chronic kidney disease: Secondary | ICD-10-CM | POA: Diagnosis not present

## 2025-01-27 DIAGNOSIS — Z720 Tobacco use: Secondary | ICD-10-CM | POA: Diagnosis not present

## 2025-01-27 DIAGNOSIS — N186 End stage renal disease: Secondary | ICD-10-CM

## 2025-01-27 DIAGNOSIS — R197 Diarrhea, unspecified: Secondary | ICD-10-CM | POA: Diagnosis not present

## 2025-01-27 DIAGNOSIS — G4733 Obstructive sleep apnea (adult) (pediatric): Secondary | ICD-10-CM

## 2025-01-27 DIAGNOSIS — E875 Hyperkalemia: Secondary | ICD-10-CM | POA: Diagnosis not present

## 2025-01-27 DIAGNOSIS — J449 Chronic obstructive pulmonary disease, unspecified: Secondary | ICD-10-CM

## 2025-01-27 DIAGNOSIS — Z729 Problem related to lifestyle, unspecified: Secondary | ICD-10-CM | POA: Diagnosis not present

## 2025-01-27 DIAGNOSIS — A419 Sepsis, unspecified organism: Secondary | ICD-10-CM

## 2025-01-27 LAB — CBC
HCT: 48.5 % — ABNORMAL HIGH (ref 36.0–46.0)
Hemoglobin: 14.7 g/dL (ref 12.0–15.0)
MCH: 29.4 pg (ref 26.0–34.0)
MCHC: 30.3 g/dL (ref 30.0–36.0)
MCV: 93 fL (ref 80.0–100.0)
Platelets: 150 10*3/uL (ref 150–400)
RBC: 5 MIL/uL (ref 3.87–5.11)
RDW: 16 % — ABNORMAL HIGH (ref 11.5–15.5)
WBC: 11.2 10*3/uL — ABNORMAL HIGH (ref 4.0–10.5)
nRBC: 0.2 % (ref 0.0–0.2)

## 2025-01-27 LAB — BASIC METABOLIC PANEL WITH GFR
Anion gap: 20 — ABNORMAL HIGH (ref 5–15)
Anion gap: 18 — ABNORMAL HIGH (ref 5–15)
BUN: 46 mg/dL — ABNORMAL HIGH (ref 8–23)
BUN: 48 mg/dL — ABNORMAL HIGH (ref 8–23)
CO2: 13 mmol/L — ABNORMAL LOW (ref 22–32)
CO2: 15 mmol/L — ABNORMAL LOW (ref 22–32)
Calcium: 8.1 mg/dL — ABNORMAL LOW (ref 8.9–10.3)
Calcium: 8.4 mg/dL — ABNORMAL LOW (ref 8.9–10.3)
Chloride: 95 mmol/L — ABNORMAL LOW (ref 98–111)
Chloride: 95 mmol/L — ABNORMAL LOW (ref 98–111)
Creatinine, Ser: 11.4 mg/dL — ABNORMAL HIGH (ref 0.44–1.00)
Creatinine, Ser: 11.4 mg/dL — ABNORMAL HIGH (ref 0.44–1.00)
GFR, Estimated: 3 mL/min — ABNORMAL LOW
GFR, Estimated: 3 mL/min — ABNORMAL LOW
Glucose, Bld: 123 mg/dL — ABNORMAL HIGH (ref 70–99)
Glucose, Bld: 124 mg/dL — ABNORMAL HIGH (ref 70–99)
Potassium: 6.1 mmol/L — ABNORMAL HIGH (ref 3.5–5.1)
Potassium: 5.4 mmol/L — ABNORMAL HIGH (ref 3.5–5.1)
Sodium: 128 mmol/L — ABNORMAL LOW (ref 135–145)
Sodium: 129 mmol/L — ABNORMAL LOW (ref 135–145)

## 2025-01-27 LAB — MISC LABCORP TEST (SEND OUT): Labcorp test code: 83935

## 2025-01-27 LAB — GLUCOSE, CAPILLARY
Glucose-Capillary: 174 mg/dL — ABNORMAL HIGH (ref 70–99)
Glucose-Capillary: 109 mg/dL — ABNORMAL HIGH (ref 70–99)

## 2025-01-27 LAB — MAGNESIUM: Magnesium: 2.2 mg/dL (ref 1.7–2.4)

## 2025-01-27 LAB — PHOSPHORUS: Phosphorus: 6.5 mg/dL — ABNORMAL HIGH (ref 2.5–4.6)

## 2025-01-27 LAB — HEPATITIS B SURFACE ANTIGEN: Hepatitis B Surface Ag: NONREACTIVE

## 2025-01-27 MED ORDER — HEPARIN SODIUM (PORCINE) 1000 UNIT/ML IJ SOLN
INTRAMUSCULAR | Status: AC
  Filled 2025-01-27: qty 4

## 2025-01-27 MED ORDER — VANCOMYCIN HCL IN DEXTROSE 1-5 GM/200ML-% IV SOLN
1000.0000 mg | Freq: Once | INTRAVENOUS | Status: DC
  Filled 2025-01-27: qty 200

## 2025-01-27 MED ORDER — HEPARIN SODIUM (PORCINE) 1000 UNIT/ML DIALYSIS
1000.0000 [IU] | INTRAMUSCULAR | Status: DC | PRN
  Administered 2025-01-28: 4200 [IU]
  Filled 2025-01-27: qty 1

## 2025-01-27 MED ORDER — ANTICOAGULANT SODIUM CITRATE 4% (200MG/5ML) IV SOLN: 5.0000 mL | Status: DC | PRN

## 2025-01-27 MED ORDER — ALTEPLASE 2 MG IJ SOLR: 2.0000 mg | Freq: Once | INTRAMUSCULAR | Status: DC | PRN

## 2025-01-27 MED ORDER — SODIUM ZIRCONIUM CYCLOSILICATE 10 G PO PACK
10.0000 g | PACK | Freq: Once | ORAL | Status: AC
  Administered 2025-01-27: 10 g via ORAL
  Filled 2025-01-27: qty 1

## 2025-01-27 MED ORDER — DEXTROSE 50 % IV SOLN
1.0000 | Freq: Once | INTRAVENOUS | Status: AC
  Administered 2025-01-27: 50 mL via INTRAVENOUS
  Filled 2025-01-27: qty 50

## 2025-01-27 MED ORDER — PENTAFLUOROPROP-TETRAFLUOROETH EX AERO: 1.0000 | INHALATION_SPRAY | CUTANEOUS | Status: DC | PRN

## 2025-01-27 MED ORDER — INSULIN ASPART 100 UNIT/ML IV SOLN
5.0000 [IU] | Freq: Once | INTRAVENOUS | Status: AC
  Administered 2025-01-27: 5 [IU] via INTRAVENOUS
  Filled 2025-01-27: qty 5

## 2025-01-27 MED ORDER — IPRATROPIUM-ALBUTEROL 0.5-2.5 (3) MG/3ML IN SOLN
3.0000 mL | Freq: Once | RESPIRATORY_TRACT | Status: AC
  Administered 2025-01-27: 3 mL via RESPIRATORY_TRACT
  Filled 2025-01-27: qty 3

## 2025-01-27 MED ORDER — LIDOCAINE HCL (PF) 1 % IJ SOLN: 5.0000 mL | INTRAMUSCULAR | Status: DC | PRN

## 2025-01-27 MED ORDER — LIDOCAINE-PRILOCAINE 2.5-2.5 % EX CREA: 1.0000 | TOPICAL_CREAM | CUTANEOUS | Status: DC | PRN

## 2025-01-27 MED ORDER — CHLORHEXIDINE GLUCONATE CLOTH 2 % EX PADS
6.0000 | MEDICATED_PAD | Freq: Every day | CUTANEOUS | Status: AC
  Administered 2025-01-27 – 2025-02-04 (×8): 6 via TOPICAL
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
## 2025-01-27 NOTE — Plan of Care (Signed)
  Problem: Education: Goal: Knowledge of General Education information will improve Description Including pain rating scale, medication(s)/side effects and non-pharmacologic comfort measures Outcome: Progressing   Problem: Clinical Measurements: Goal: Ability to maintain clinical measurements within normal limits will improve Outcome: Progressing   Problem: Activity: Goal: Risk for activity intolerance will decrease Outcome: Progressing   Problem: Coping: Goal: Level of anxiety will decrease Outcome: Progressing   Problem: Skin Integrity: Goal: Risk for impaired skin integrity will decrease Outcome: Progressing   

## 2025-01-27 NOTE — Plan of Care (Signed)

## 2025-01-27 NOTE — Progress Notes (Addendum)
 Whitney Walker is an 69 y.o. female ESRD on HD MWF at [facility].  Past medical history significant for HTN, chronic diastolic HF, COPD, CAD, PAD s/p left fem pop 2014, Hx colon cancer in remission, OSA not on CPAP, OA, anxiety/depression, Hx DVT + PE on AC and history of hyperkalemia (on Lokelma  at home).  Patient has had to shorten treatments the past 2 treatments including today receiving only approximately 2 hours out of her 3-1/2-hour planned and prescribed treatment because of hypotension.  This does occur occasionally but response to fluid but unfortunately at this time she received 500 milliliters of isotonic fluid with no improvement.  Patient was found diastolic pressure of 60 transported to the emergency department.  Patient has had a cough, nausea, weakness since Saturday, missed dialysis on Monday and generalized abdominal pain, chronic diarrhea but denies any dark black tarry stool or hematochezia.  Patient does have some shortness of breath which is chronic and not any worse than usual.  Patient was given another fluid bolus in the emergency room and started on Levophed  7mcg.  CT of the chest abdomen pelvis was performed showing a solid 6 mm nodule inferiorly in the right lower lobe as well as a new left upper lobe nodule.  Changes with prior subtotal colectomy with no distention or wall thickening/inflammation.  Patient with noted discomfort around the catheter insertion site for a few days.    Assessment/Plan: ESRD -  on MWF.  Only got 2 hrs of treatment today and she's stating she doesn't want dialysis tomorrow, preferring to wait till Fri.  Will re-evaluate tomorrow and see if she's able to get her next HD on Fri; she's already below her EDW. Lengthy discussion again about permanent access which she continues to refuse; she will live and die with a catheter. She's had bad experiences with infiltrations previously. - Plan on HD today; I'm not convinced she has a catheter related  infection. If Staph grows out then will need the TC removed but her cough is much worse than usual (has a chronic cough).   Additional recommendations - Dose all meds for creatinine clearance < 10 ml/min  - Unless absolutely necessary, no MRIs with gadolinium.  - Prefer needle sticks in the dorsum of the hands or wrists.  No blood pressure measurements in right arm. - If blood transfusion is requested during hemodialysis sessions, please alert us  prior to the session.  - If a hemodialysis catheter line culture is requested, please alert us  as only hemodialysis nurses are able to collect those specimens.   Avoid nephrotoxic medications including NSAIDs and iodinated intravenous contrast exposure unless the latter is absolutely indicated.  Preferred narcotic agents for pain control are hydromorphone , fentanyl , and methadone. Morphine  should not be used. Avoid Baclofen and avoid oral sodium phosphate  and magnesium  citrate based laxatives / bowel preps. Continue strict Input and Output monitoring. Will monitor the patient closely with you and intervene or adjust therapy as indicated by changes in clinical status/labs    Hypotension/volume  - BP in goal. Does not appear volume overloaded.  Under dry weight as mentioned above. Suspicious for infection; there was a small amount of pus expressed from the exit site.  Anemia of CKD - Hgb 16.7. No indication for ESA/iron  therapy with active infection; last ESA was given 09/13/24.   Secondary Hyperparathyroidism -  Ca in goal. Check phos. Continue VDRA and binders.   Nutrition - Renal diet w/fluid restrictions when advanced.  H/o COPD PAD s/p  bypass Prior DVT/PE on Eliquis   Subjective: Chronic cough but denies fever, chills, nausea; stating she's ready to go home. On Levophed .   Chemistry and CBC: Creatinine  Date/Time Value Ref Range Status  06/22/2024 12:22 PM 8.16 (HH) 0.44 - 1.00 mg/dL Final    Comment:    REPEATED TO VERIFY CRITICAL RESULT CALLED  TO, READ BACK BY AND VERIFIED WITH: Anders Seena PEAK at 1332 by Aldona [name] (MT) 06/22/2024    05/22/2023 11:27 AM 7.43 (HH) 0.44 - 1.00 mg/dL Final    Comment:    REPEATED TO VERIFY CRITICAL RESULT CALLED TO, READ BACK BY AND VERIFIED WITH: Anders Seena PEAK at 1227 pm by Aldona [name] (MT) 05/22/2023    04/21/2019 12:00 AM 10.7 (A) 0.5 - 1.1 Final  07/07/2017 12:35 PM 3.4 (HH) 0.6 - 1.1 mg/dL Final  98/94/7981 90:70 AM 3.4 Repeated and Verified (HH) 0.6 - 1.1 mg/dL Final  91/92/7982 87:64 PM 2.6 (H) 0.6 - 1.1 mg/dL Final  98/86/7983 96:79 PM 1.9 (H) 0.6 - 1.1 mg/dL Final  89/68/7985 87:58 PM 1.6 (H) 0.6 - 1.1 mg/dL Final  87/93/7986 89:43 AM 1.7 (H) 0.6 - 1.1 mg/dL Final   Creatinine, Ser  Date/Time Value Ref Range Status  01/27/2025 06:14 AM 11.40 (H) 0.44 - 1.00 mg/dL Final  98/70/7973 97:91 AM 11.40 (H) 0.44 - 1.00 mg/dL Final  98/71/7973 97:63 PM 9.50 (H) 0.44 - 1.00 mg/dL Final  98/71/7973 90:58 AM 9.50 (H) 0.44 - 1.00 mg/dL Final  94/77/7974 93:83 AM 7.11 (H) 0.44 - 1.00 mg/dL Final    Comment:    DELTA CHECK NOTED  05/19/2024 10:19 AM 4.59 (H) 0.44 - 1.00 mg/dL Final  94/85/7974 93:53 AM 10.79 (H) 0.44 - 1.00 mg/dL Final  94/88/7974 92:79 AM 8.84 (H) 0.44 - 1.00 mg/dL Final  98/69/7974 97:42 PM 7.35 (H) 0.44 - 1.00 mg/dL Final  87/96/7975 93:91 AM 7.27 (H) 0.44 - 1.00 mg/dL Final  87/97/7975 90:69 PM 13.13 (H) 0.44 - 1.00 mg/dL Final  87/97/7975 98:94 PM 13.38 (H) 0.44 - 1.00 mg/dL Final  95/90/7975 93:56 PM 9.81 (H) 0.44 - 1.00 mg/dL Final  87/87/7976 87:99 PM 9.10 (H) 0.44 - 1.00 mg/dL Final  93/98/7976 95:95 PM 7.91 (H) 0.57 - 1.00 mg/dL Final  94/77/7976 98:68 AM 9.12 (H) 0.44 - 1.00 mg/dL Final  94/78/7976 91:92 PM 9.05 (H) 0.44 - 1.00 mg/dL Final    Comment:    DELTA CHECK NOTED  05/18/2022 04:33 AM 5.49 (H) 0.44 - 1.00 mg/dL Final    Comment:    DELTA CHECK NOTED  05/17/2022 12:47 AM 9.04 (H) 0.44 - 1.00 mg/dL Final  94/81/7976 97:92 PM 8.21 (H) 0.44 - 1.00  mg/dL Final  94/94/7976 98:81 AM 10.62 (H) 0.44 - 1.00 mg/dL Final  94/96/7976 89:80 PM 7.90 (H) 0.44 - 1.00 mg/dL Final  94/96/7976 98:42 PM 15.55 (H) 0.44 - 1.00 mg/dL Final  94/96/7976 91:89 AM 15.23 (H) 0.44 - 1.00 mg/dL Final  88/82/7977 88:54 AM 6.96 (H) 0.57 - 1.00 mg/dL Final  92/75/7977 87:53 AM 6.33 (H) 0.44 - 1.00 mg/dL Final  92/76/7977 98:79 AM 9.47 (H) 0.44 - 1.00 mg/dL Final  92/77/7977 98:50 AM 8.09 (H) 0.44 - 1.00 mg/dL Final  92/79/7977 88:54 PM 5.80 (H) 0.44 - 1.00 mg/dL Final    Comment:    DELTA CHECK NOTED  07/18/2021 02:30 PM 8.91 (H) 0.44 - 1.00 mg/dL Final  92/80/7977 95:91 AM 7.20 (H) 0.44 - 1.00 mg/dL Final  92/81/7977 97:97 PM 7.31 (H) 0.44 - 1.00  mg/dL Final  93/97/7977 97:72 AM 5.98 (H) 0.44 - 1.00 mg/dL Final    Comment:    DELTA CHECK NOTED  05/30/2021 01:19 AM 9.84 (H) 0.44 - 1.00 mg/dL Final  94/68/7977 87:61 AM 7.54 (H) 0.44 - 1.00 mg/dL Final    Comment:    DELTA CHECK NOTED DIALYSIS   05/28/2021 08:42 AM 12.78 (H) 0.44 - 1.00 mg/dL Final  94/80/7977 87:80 PM 8.60 (HH) 0.44 - 1.00 mg/dL Final    Comment:    CRITICAL RESULT CALLED TO, READ BACK BY AND VERIFIED WITH: karen beach at 1308 by hflynt 05/17/21   04/25/2021 11:28 AM 4.34 (H) 0.57 - 1.00 mg/dL Final    Comment:    **Verified by repeat analysis**  03/15/2021 11:43 AM 6.27 (H) 0.57 - 1.00 mg/dL Final  97/92/7977 96:58 PM 4.81 (H) 0.44 - 1.00 mg/dL Final  88/96/7978 87:64 AM 10.69 (H) 0.44 - 1.00 mg/dL Final  88/97/7978 87:72 AM 8.04 (H) 0.44 - 1.00 mg/dL Final    Comment:    DELTA CHECK NOTED  10/30/2020 04:07 AM 12.21 (H) 0.44 - 1.00 mg/dL Final  94/79/7978 89:63 AM 6.98 (HH) 0.44 - 1.00 mg/dL Final    Comment:    CRITICAL RESULT CALLED TO, READ BACK BY AND VERIFIED WITH: karen beach by hflynt 05/18/20   05/10/2020 06:31 AM 8.44 (H) 0.44 - 1.00 mg/dL Final  94/88/7978 87:53 PM 5.80 (H) 0.44 - 1.00 mg/dL Final    Comment:    DELTA CHECK NOTED DIALYSIS   05/09/2020 07:08 AM 13.59  (H) 0.44 - 1.00 mg/dL Final  94/88/7978 87:94 AM 13.20 (H) 0.44 - 1.00 mg/dL Final  88/80/7979 87:60 PM 8.25 (HH) 0.44 - 1.00 mg/dL Final    Comment:    CRITICAL RESULT CALLED TO, READ BACK BY AND VERIFIED WITH: Mazie Boyer, RN at 1334 by CHRISTELLA Rattler   03/03/2019 05:14 AM 6.40 (H) 0.44 - 1.00 mg/dL Final    Comment:    DELTA CHECK NOTED  03/02/2019 04:35 PM 15.49 (H) 0.44 - 1.00 mg/dL Final  97/81/7979 98:85 PM 6.45 (HH) 0.44 - 1.00 mg/dL Final    Comment:    CRITICAL RESULT CALLED TO, READ BACK BY AND VERIFIED WITH: CHERYL ANDERSON,RN     Recent Labs  Lab 01/26/25 0941 01/26/25 1436 01/27/25 0208 01/27/25 0614  NA 133* 130* 128* 129*  K 4.8 5.1 6.1* 5.4*  CL 103 92* 95* 95*  CO2  --  14* 13* 15*  GLUCOSE 92 86 123* 124*  BUN 41* 39* 46* 48*  CREATININE 9.50* 9.50* 11.40* 11.40*  CALCIUM   --  8.7* 8.1* 8.4*  PHOS  --   --  6.5*  --    Recent Labs  Lab 01/26/25 0931 01/26/25 0941 01/27/25 0208  WBC 11.6*  --  11.2*  NEUTROABS 8.7*  --   --   HGB 14.7 16.7* 14.7  HCT 44.8 49.0* 48.5*  MCV 90.0  --  93.0  PLT 150  --  150   Liver Function Tests: Recent Labs  Lab 01/26/25 1436  AST 32  ALT 26  ALKPHOS 130*  BILITOT 1.0  PROT 7.8  ALBUMIN  3.7   Recent Labs  Lab 01/26/25 1436  LIPASE 40   No results for input(s): AMMONIA in the last 168 hours. Cardiac Enzymes: No results for input(s): CKTOTAL, CKMB, CKMBINDEX, TROPONINI in the last 168 hours. Iron  Studies: No results for input(s): IRON , TIBC, TRANSFERRIN, FERRITIN in the last 72 hours. PT/INR: @LABRCNTIP (inr:5)  Xrays/Other Studies: )  Results for orders placed or performed during the hospital encounter of 01/26/25 (from the past 48 hours)  Resp panel by RT-PCR (RSV, Flu A&B, Covid) Anterior Nasal Swab     Status: None   Collection Time: 01/26/25  9:08 AM   Specimen: Anterior Nasal Swab  Result Value Ref Range   SARS Coronavirus 2 by RT PCR NEGATIVE NEGATIVE   Influenza A by PCR  NEGATIVE NEGATIVE   Influenza B by PCR NEGATIVE NEGATIVE    Comment: (NOTE) The Xpert Xpress SARS-CoV-2/FLU/RSV plus assay is intended as an aid in the diagnosis of influenza from Nasopharyngeal swab specimens and should not be used as a sole basis for treatment. Nasal washings and aspirates are unacceptable for Xpert Xpress SARS-CoV-2/FLU/RSV testing.  Fact Sheet for Patients: bloggercourse.com  Fact Sheet for Healthcare Providers: seriousbroker.it  This test is not yet approved or cleared by the [place]  FDA and has been authorized for detection and/or diagnosis of SARS-CoV-2 by FDA under an Emergency Use Authorization (EUA). This EUA will remain in effect (meaning this test can be used) for the duration of the COVID-19 declaration under Section 564(b)(1) of the Act, 21 U.S.C. section 360bbb-3(b)(1), unless the authorization is terminated or revoked.     Resp Syncytial Virus by PCR NEGATIVE NEGATIVE    Comment: (NOTE) Fact Sheet for Patients: bloggercourse.com  Fact Sheet for Healthcare Providers: seriousbroker.it  This test is not yet approved or cleared by the [place]  FDA and has been authorized for detection and/or diagnosis of SARS-CoV-2 by FDA under an Emergency Use Authorization (EUA). This EUA will remain in effect (meaning this test can be used) for the duration of the COVID-19 declaration under Section 564(b)(1) of the Act, 21 U.S.C. section 360bbb-3(b)(1), unless the authorization is terminated or revoked.  Performed at [facility], [address], [place], [place] [address]   CBC with Differential     Status: Abnormal   Collection Time: 01/26/25  9:31 AM  Result Value Ref Range   WBC 11.6 (H) 4.0 - 10.5 K/uL   RBC 4.98 3.87 - 5.11 MIL/uL   Hemoglobin 14.7 12.0 - 15.0 g/dL   HCT 55.1 63.9 - 53.9 %   MCV 90.0 80.0 - 100.0 fL   MCH 29.5 26.0  - 34.0 pg   MCHC 32.8 30.0 - 36.0 g/dL   RDW 84.7 88.4 - 84.4 %   Platelets 150 150 - 400 K/uL   nRBC 0.0 0.0 - 0.2 %   Neutrophils Relative % 75 %   Neutro Abs 8.7 (H) 1.7 - 7.7 K/uL   Lymphocytes Relative 15 %   Lymphs Abs 1.8 0.7 - 4.0 K/uL   Monocytes Relative 7 %   Monocytes Absolute 0.8 0.1 - 1.0 K/uL   Eosinophils Relative 1 %   Eosinophils Absolute 0.1 0.0 - 0.5 K/uL   Basophils Relative 0 %   Basophils Absolute 0.1 0.0 - 0.1 K/uL   Immature Granulocytes 2 %   Abs Immature Granulocytes 0.17 (H) 0.00 - 0.07 K/uL    Comment: Performed at [facility], [address] [place], [place] [address]  I-stat chem 8, ED (not at [facility], [facility] or [facility])     Status: Abnormal   Collection Time: 01/26/25  9:41 AM  Result Value Ref Range   Sodium 133 (L) 135 - 145 mmol/L   Potassium 4.8 3.5 - 5.1 mmol/L   Chloride 103 98 - 111 mmol/L   BUN 41 (H) 8 - 23 mg/dL  Creatinine, Ser 9.50 (H) 0.44 - 1.00 mg/dL   Glucose, Bld 92 70 - 99 mg/dL    Comment: Glucose reference range applies only to samples taken after fasting for at least 8 hours.   Calcium , Ion 0.92 (L) 1.15 - 1.40 mmol/L   TCO2 18 (L) 22 - 32 mmol/L   Hemoglobin 16.7 (H) 12.0 - 15.0 g/dL   HCT 50.9 (H) 63.9 - 53.9 %  I-Stat CG4 Lactic Acid     Status: Abnormal   Collection Time: 01/26/25  9:42 AM  Result Value Ref Range   Lactic Acid, Venous 2.4 (HH) 0.5 - 1.9 mmol/L   Comment NOTIFIED PHYSICIAN   MRSA Next Gen by PCR, Nasal     Status: None   Collection Time: 01/26/25 12:42 PM   Specimen: Nasal Mucosa; Nasal Swab  Result Value Ref Range   MRSA by PCR Next Gen NOT DETECTED NOT DETECTED    Comment: (NOTE) The GeneXpert MRSA Assay (FDA approved for NASAL specimens only), is one component of a comprehensive MRSA colonization surveillance program. It is not intended to diagnose MRSA infection nor to guide or monitor treatment for MRSA infections. Test performance is not FDA approved in patients less than 55  years old. Performed at [facility], [address], [place], [place] [address]   Glucose, capillary     Status: None   Collection Time: 01/26/25  2:02 PM  Result Value Ref Range   Glucose-Capillary 93 70 - 99 mg/dL    Comment: Glucose reference range applies only to samples taken after fasting for at least 8 hours.  Comprehensive metabolic panel with GFR     Status: Abnormal   Collection Time: 01/26/25  2:36 PM  Result Value Ref Range   Sodium 130 (L) 135 - 145 mmol/L    Comment: Electrolytes repeated to verify    Potassium 5.1 3.5 - 5.1 mmol/L   Chloride 92 (L) 98 - 111 mmol/L   CO2 14 (L) 22 - 32 mmol/L   Glucose, Bld 86 70 - 99 mg/dL    Comment: Glucose reference range applies only to samples taken after fasting for at least 8 hours.   BUN 39 (H) 8 - 23 mg/dL   Creatinine, Ser 0.49 (H) 0.44 - 1.00 mg/dL   Calcium  8.7 (L) 8.9 - 10.3 mg/dL   Total Protein 7.8 6.5 - 8.1 g/dL   Albumin  3.7 3.5 - 5.0 g/dL   AST 32 15 - 41 U/L   ALT 26 0 - 44 U/L   Alkaline Phosphatase 130 (H) 38 - 126 U/L   Total Bilirubin 1.0 0.0 - 1.2 mg/dL   GFR, Estimated 4 (L) >60 mL/min    Comment: (NOTE) Calculated using the CKD-EPI Creatinine Equation (2021)    Anion gap 23 (H) 5 - 15    Comment: Performed at [facility], [address], [place], [place] [address]  Lipase, blood     Status: None   Collection Time: 01/26/25  2:36 PM  Result Value Ref Range   Lipase 40 11 - 51 U/L    Comment: Performed at [facility], [address] [place], [place] [address]  Magnesium      Status: None   Collection Time: 01/26/25  2:36 PM  Result Value Ref Range   Magnesium  2.2 1.7 - 2.4 mg/dL    Comment: Performed at [facility], [address], [place], [place] [address]  Miscellaneous [facility] test (send-out)  Status: None   Collection Time: 01/26/25  2:36 PM  Result Value Ref Range   [facility] test code [id number]    [facility] test name [id number]    Source ([facility]) SERUM     Comment:  Performed at [facility], [address], [place], [place] [address]   Misc [facility] result COMMENT     Comment: (NOTE) Test Ordered: [id number] HIV Ab/p24 Ag with Reflex HIV Ab/p24 Ag Screen           Note:                     BN     Non Reactive                                                  Reference Range: Non Reactive                          HIV-1/HIV-2 antibodies and HIV-1 p24 antigen were NOT detected. There is no laboratory evidence of HIV infection. HIV Negative Performed At: [facility] [address] [place], [place] [id number] Jennette Shorter MD Ey:[id number]   Lactic acid, plasma     Status: None   Collection Time: 01/26/25  2:37 PM  Result Value Ref Range   Lactic Acid, Venous 1.8 0.5 - 1.9 mmol/L    Comment: Performed at [facility], [address], [place], [place] [address]  Glucose, capillary     Status: Abnormal   Collection Time: 01/26/25  8:02 PM  Result Value Ref Range   Glucose-Capillary 232 (H) 70 - 99 mg/dL    Comment: Glucose reference range applies only to samples taken after fasting for at least 8 hours.  CBC     Status: Abnormal   Collection Time: 01/27/25  2:08 AM  Result Value Ref Range   WBC 11.2 (H) 4.0 - 10.5 K/uL   RBC 5.00 3.87 - 5.11 MIL/uL   Hemoglobin 14.7 12.0 - 15.0 g/dL   HCT 51.4 (H) 63.9 - 53.9 %   MCV 93.0 80.0 - 100.0 fL   MCH 29.4 26.0 - 34.0 pg   MCHC 30.3 30.0 - 36.0 g/dL   RDW 83.9 (H) 88.4 - 84.4 %   Platelets 150 150 - 400 K/uL   nRBC 0.2 0.0 - 0.2 %    Comment: Performed at [facility], [address] [place], [place] [address]  Basic metabolic panel     Status: Abnormal   Collection Time: 01/27/25  2:08 AM  Result Value Ref Range   Sodium 128 (L) 135 - 145 mmol/L    Comment: Electrolytes repeated to verify    Potassium 6.1 (H) 3.5 - 5.1 mmol/L    Comment: HEMOLYSIS AT THIS LEVEL MAY AFFECT RESULT   Chloride 95 (L) 98 - 111 mmol/L   CO2 13 (L) 22 - 32 mmol/L   Glucose, Bld 123 (H) 70 - 99  mg/dL    Comment: Glucose reference range applies only to samples taken after fasting for at least 8 hours.   BUN 46 (H) 8 - 23 mg/dL   Creatinine, Ser 88.59 (H) 0.44 - 1.00 mg/dL   Calcium  8.1 (L) 8.9 - 10.3 mg/dL   GFR, Estimated 3 (L) >60 mL/min    Comment: (NOTE) Calculated  using the CKD-EPI Creatinine Equation (2021)    Anion gap 20 (H) 5 - 15    Comment: Performed at [facility], [address] [place], [place] [address]  Magnesium      Status: None   Collection Time: 01/27/25  2:08 AM  Result Value Ref Range   Magnesium  2.2 1.7 - 2.4 mg/dL    Comment: Performed at [facility], [address], [place], [place] [address]  Phosphorus     Status: Abnormal   Collection Time: 01/27/25  2:08 AM  Result Value Ref Range   Phosphorus 6.5 (H) 2.5 - 4.6 mg/dL    Comment: Performed at [facility], [address], [place], [place] [address]  Basic metabolic panel     Status: Abnormal   Collection Time: 01/27/25  6:14 AM  Result Value Ref Range   Sodium 129 (L) 135 - 145 mmol/L   Potassium 5.4 (H) 3.5 - 5.1 mmol/L   Chloride 95 (L) 98 - 111 mmol/L   CO2 15 (L) 22 - 32 mmol/L   Glucose, Bld 124 (H) 70 - 99 mg/dL    Comment: Glucose reference range applies only to samples taken after fasting for at least 8 hours.   BUN 48 (H) 8 - 23 mg/dL   Creatinine, Ser 88.59 (H) 0.44 - 1.00 mg/dL   Calcium  8.4 (L) 8.9 - 10.3 mg/dL   GFR, Estimated 3 (L) >60 mL/min    Comment: (NOTE) Calculated using the CKD-EPI Creatinine Equation (2021)    Anion gap 18 (H) 5 - 15    Comment: Performed at [facility], [address] [place], [place] [address]   *Note: Due to a large number of results and/or encounters for the requested time period, some results have not been displayed. A complete set of results can be found in Results Review.   DG Chest Port 1 View Result Date: 01/27/2025 EXAM: 1 VIEW(S) XRAY OF THE CHEST 01/27/2025 04:47:00 AM COMPARISON: 01/26/2025 CLINICAL HISTORY:  Sepsis. FINDINGS: LINES, TUBES AND DEVICES: Stable left chest tunneled dialysis catheter. LUNGS AND PLEURA: No focal pulmonary opacity. No pleural effusion. No pneumothorax. HEART AND MEDIASTINUM: No acute abnormality of the cardiac and mediastinal silhouettes. BONES AND SOFT TISSUES: No acute osseous abnormality. IMPRESSION: 1. No acute cardiopulmonary findings. 2. Stable left chest tunneled dialysis catheter. Electronically signed by: Evalene Coho MD 01/27/2025 05:16 AM EST RP Workstation: [id number]   DG Chest Portable 1 View Result Date: 01/26/2025 CLINICAL DATA:  Sepsis. EXAM: PORTABLE CHEST 1 VIEW COMPARISON:  05/19/2024 been CT chest 01/26/2025. FINDINGS: Trachea is midline. Heart size stable. Left IJ dialysis catheter tips are in the SVC and right atrium. Mild basilar streaky opacification. No pleural fluid. IMPRESSION: Minimal bibasilar subsegmental atelectasis. Electronically Signed   By: Newell Eke M.D.   On: 01/26/2025 11:34   CT Angio Chest PE W and/or Wo Contrast Result Date: 01/26/2025 CLINICAL DATA:  history of DVT. Hypotension and tachy. Likely sepsis. Eval for source and PE; sepsis. Diarrhea. Pain in llq. Eval for diverticulitis EXAM: CT ANGIOGRAPHY CHEST CT ABDOMEN AND PELVIS WITH CONTRAST TECHNIQUE: Multidetector CT imaging of the chest was performed using the standard protocol during bolus administration of intravenous contrast. Multiplanar CT image reconstructions and MIPs were obtained to evaluate the vascular anatomy. Multidetector CT imaging of the abdomen and pelvis was performed using the standard protocol during bolus administration of intravenous contrast. RADIATION DOSE REDUCTION: This exam was performed according to the departmental dose-optimization  program which includes automated exposure control, adjustment of the mA and/or kV according to patient size and/or use of iterative reconstruction technique. CONTRAST:  75mL OMNIPAQUE  IOHEXOL  350 MG/ML SOLN COMPARISON:   Chest CT 05/19/2024 and 05/09/2024. Abdominopelvic CT 12/01/2023. FINDINGS: CTA CHEST FINDINGS Cardiovascular: The pulmonary arteries are well opacified with contrast to the level of the segmental branches. There is no evidence of acute pulmonary embolism. There is central enlargement of the pulmonary arteries. Diffuse atherosclerosis of the aorta, great vessels and coronary arteries with calcifications of the aortic valve. The heart size is normal. There is no pericardial effusion. Mediastinum/Nodes: There are no enlarged mediastinal, hilar or axillary lymph nodes. The thyroid  gland, trachea and esophagus demonstrate no significant findings. Lungs/Pleura: No pleural effusion or pneumothorax. Mild centrilobular emphysema and central airway thickening. Interval improvement in previously demonstrated linear right lung atelectasis. There is a new part solid nodule inferiorly in the right lower lobe, measuring 6 x 6 mm on image 57/12. 3 mm left upper lobe nodule on image 37/12 appears new. No confluent airspace disease. Musculoskeletal/Chest wall: No chest wall mass or suspicious osseous findings. Multilevel spondylosis. CT ABDOMEN AND PELVIS FINDINGS Hepatobiliary: The liver is normal in density without suspicious focal abnormality. No significant biliary dilatation status post cholecystectomy. Pancreas: Unremarkable. No pancreatic ductal dilatation or surrounding inflammatory changes. Spleen: Stable splenic lobularity and scarring. No focal abnormality. Adrenals/Urinary Tract: Both adrenal glands appear normal. No evidence of urinary tract calculus, hydronephrosis or perinephric soft tissue stranding. The kidneys appear unchanged with cortical thinning and simple appearing cysts bilaterally for which no specific follow-up imaging is recommended. The urinary bladder is nearly empty and suboptimally evaluated. Stomach/Bowel: No enteric contrast administered. Stable small hiatal hernia. The stomach otherwise appears  unremarkable for its degree of distention. Stable postsurgical changes from subtotal colectomy with ileorectal anastomosis. The rectum is fluid-filled without significant distension, wall thickening or surrounding inflammation. Vascular/Lymphatic: There are no enlarged abdominal or pelvic lymph nodes. Aortic and branch vessel atherosclerosis without evidence of aneurysm or large vessel occlusion. Reproductive: Status post hysterectomy. No evidence of adnexal mass. Other: Postsurgical changes in the anterior abdominal wall with stable small umbilical hernia containing only fat. No ascites or pneumoperitoneum. Musculoskeletal: No acute or significant osseous findings. Review of the MIP images confirms the above findings. IMPRESSION: 1. No evidence of acute pulmonary embolism or other acute vascular findings in the chest. 2. New part solid 6 mm nodule inferiorly in the right lower lobe and new 3 mm left upper lobe nodule, indeterminate. Follow-up non-contrast CT recommended at 3-6 months to confirm persistence. If unchanged, and solid component remains <6 mm, annual CT is recommended until 5 years of stability has been established. If persistent these nodules should be considered highly suspicious if the solid component of the nodule is 6 mm or greater in size and enlarging. This recommendation follows the consensus statement: Guidelines for Management of Incidental Pulmonary Nodules Detected on CT Images: From the Fleischner Society 2017; Radiology 2017; 284:228-243. 3. No acute findings in the abdomen or pelvis. 4. Stable postsurgical changes from subtotal colectomy with ileorectal anastomosis. The rectum is fluid-filled without significant distension, wall thickening or surrounding inflammation. 5. Aortic Atherosclerosis (ICD10-I70.0) and Emphysema (ICD10-J43.9). Electronically Signed   By: Elsie Perone M.D.   On: 01/26/2025 10:48   CT ABDOMEN PELVIS W CONTRAST Result Date: 01/26/2025 CLINICAL DATA:  history  of DVT. Hypotension and tachy. Likely sepsis. Eval for source and PE; sepsis. Diarrhea. Pain in llq. Eval for diverticulitis EXAM: CT  ANGIOGRAPHY CHEST CT ABDOMEN AND PELVIS WITH CONTRAST TECHNIQUE: Multidetector CT imaging of the chest was performed using the standard protocol during bolus administration of intravenous contrast. Multiplanar CT image reconstructions and MIPs were obtained to evaluate the vascular anatomy. Multidetector CT imaging of the abdomen and pelvis was performed using the standard protocol during bolus administration of intravenous contrast. RADIATION DOSE REDUCTION: This exam was performed according to the departmental dose-optimization program which includes automated exposure control, adjustment of the mA and/or kV according to patient size and/or use of iterative reconstruction technique. CONTRAST:  75mL OMNIPAQUE  IOHEXOL  350 MG/ML SOLN COMPARISON:  Chest CT 05/19/2024 and 05/09/2024. Abdominopelvic CT 12/01/2023. FINDINGS: CTA CHEST FINDINGS Cardiovascular: The pulmonary arteries are well opacified with contrast to the level of the segmental branches. There is no evidence of acute pulmonary embolism. There is central enlargement of the pulmonary arteries. Diffuse atherosclerosis of the aorta, great vessels and coronary arteries with calcifications of the aortic valve. The heart size is normal. There is no pericardial effusion. Mediastinum/Nodes: There are no enlarged mediastinal, hilar or axillary lymph nodes. The thyroid  gland, trachea and esophagus demonstrate no significant findings. Lungs/Pleura: No pleural effusion or pneumothorax. Mild centrilobular emphysema and central airway thickening. Interval improvement in previously demonstrated linear right lung atelectasis. There is a new part solid nodule inferiorly in the right lower lobe, measuring 6 x 6 mm on image 57/12. 3 mm left upper lobe nodule on image 37/12 appears new. No confluent airspace disease. Musculoskeletal/Chest wall:  No chest wall mass or suspicious osseous findings. Multilevel spondylosis. CT ABDOMEN AND PELVIS FINDINGS Hepatobiliary: The liver is normal in density without suspicious focal abnormality. No significant biliary dilatation status post cholecystectomy. Pancreas: Unremarkable. No pancreatic ductal dilatation or surrounding inflammatory changes. Spleen: Stable splenic lobularity and scarring. No focal abnormality. Adrenals/Urinary Tract: Both adrenal glands appear normal. No evidence of urinary tract calculus, hydronephrosis or perinephric soft tissue stranding. The kidneys appear unchanged with cortical thinning and simple appearing cysts bilaterally for which no specific follow-up imaging is recommended. The urinary bladder is nearly empty and suboptimally evaluated. Stomach/Bowel: No enteric contrast administered. Stable small hiatal hernia. The stomach otherwise appears unremarkable for its degree of distention. Stable postsurgical changes from subtotal colectomy with ileorectal anastomosis. The rectum is fluid-filled without significant distension, wall thickening or surrounding inflammation. Vascular/Lymphatic: There are no enlarged abdominal or pelvic lymph nodes. Aortic and branch vessel atherosclerosis without evidence of aneurysm or large vessel occlusion. Reproductive: Status post hysterectomy. No evidence of adnexal mass. Other: Postsurgical changes in the anterior abdominal wall with stable small umbilical hernia containing only fat. No ascites or pneumoperitoneum. Musculoskeletal: No acute or significant osseous findings. Review of the MIP images confirms the above findings. IMPRESSION: 1. No evidence of acute pulmonary embolism or other acute vascular findings in the chest. 2. New part solid 6 mm nodule inferiorly in the right lower lobe and new 3 mm left upper lobe nodule, indeterminate. Follow-up non-contrast CT recommended at 3-6 months to confirm persistence. If unchanged, and solid component  remains <6 mm, annual CT is recommended until 5 years of stability has been established. If persistent these nodules should be considered highly suspicious if the solid component of the nodule is 6 mm or greater in size and enlarging. This recommendation follows the consensus statement: Guidelines for Management of Incidental Pulmonary Nodules Detected on CT Images: From the Fleischner Society 2017; Radiology 2017; 284:228-243. 3. No acute findings in the abdomen or pelvis. 4. Stable postsurgical changes from subtotal colectomy with  ileorectal anastomosis. The rectum is fluid-filled without significant distension, wall thickening or surrounding inflammation. 5. Aortic Atherosclerosis (ICD10-I70.0) and Emphysema (ICD10-J43.9). Electronically Signed   By: Elsie Perone M.D.   On: 01/26/2025 10:48    PMH:   Past Medical History:  Diagnosis Date   Acute left-sided low back pain without sciatica 02/22/2024   Anemia    Anxiety    CHF (congestive heart failure) (HCC)    Colon cancer (HCC)    treatment surgery   Complication of anesthesia    after first [place]- [place] couldnt walk after, patient denies having a spinal   COPD (chronic obstructive pulmonary disease) (HCC)    Coronary artery disease    Depression    DVT (deep venous thrombosis) (HCC)    Encounter for annual health examination 08/05/2023   ESRD on hemodialysis (HCC)    Hemo: MWF   History of blood transfusion 04/2018   Hypertension    07/07/18- no longer takes BP medications   Left hip pain 05/16/2022   Left leg pain 05/09/2020   Leucocytosis 04/28/2012   Meningitis    Orthostatic hypotension 05/16/2022   Pain in limb 07/30/2013   PE (pulmonary embolism)    Peripheral vascular disease    Primary hypertension 11/15/2013   Rash 11/25/2023   Restless legs    Shortness of breath    with exertion   Sleep apnea    SOB (shortness of breath) 03/03/2019   Vertigo     PSH:   Past Surgical History:  Procedure Laterality Date    ABDOMINAL AORTAGRAM N/A 07/26/2013   Procedure: ABDOMINAL EZELLA;  Surgeon: Redell LITTIE Door, MD;  Location: [facility] CATH LAB;  Service: Cardiovascular;  Laterality: N/A;   ABDOMINAL HYSTERECTOMY     AV FISTULA PLACEMENT Left 05/05/2018   Procedure: ARTERIOVENOUS (AV) FISTULA CREATION BRACHIOCEPHALIC;  Surgeon: Sheree Penne Bruckner, MD;  Location[facility] OR;  Service: Vascular;  Laterality: Left;   AV FISTULA PLACEMENT Left 07/16/2018   Procedure: ARTERIOVENOUS FISTULA CREATION;  Surgeon: Sheree Penne Bruckner, MD;  Location: [facility] OR;  Service: Vascular;  Laterality: Left;   BASCILIC VEIN TRANSPOSITION Left 09/03/2018   Procedure: BASILIC VEIN TRANSPOSITION SECOND STAGE;  Surgeon: Sheree Penne Bruckner, MD;  Location: [facility] OR;  Service: Vascular;  Laterality: Left;   BREAST BIOPSY Left    CESAREAN SECTION     X 3 1974-1977   CHOLECYSTECTOMY     CHOLECYSTECTOMY  1980/s   COLECTOMY  2010   DIVERTICULOSIS SURGERY-2002  2012   FEMORAL-POPLITEAL BYPASS GRAFT Left 08/13/2013   Procedure: BYPASS GRAFT FEMORAL-POPLITEAL ARTERY WITH NON-REVERSED SAPHANEOUS VEIN; ULTRASOUND GUIDED;  Surgeon: Lynwood JONETTA Collum, MD;  Location: [facility] OR;  Service: Vascular;  Laterality: Left;   FLEXIBLE SIGMOIDOSCOPY N/A 12/10/2022   Procedure: FLEXIBLE SIGMOIDOSCOPY;  Surgeon: Rollin Dover, MD;  Location: [facility];  Service: Gastroenterology;  Laterality: N/A;   INSERTION OF DIALYSIS CATHETER Right 04/30/2018   Procedure: INSERTION OF Right Internal Jugular DIALYSIS CATHETER;  Surgeon: Serene Gaile ORN, MD;  Location: [facility] OR;  Service: Vascular;  Laterality: Right;   IR FLUORO GUIDE CV LINE LEFT  05/01/2022   IR FLUORO GUIDE CV LINE LEFT  05/21/2022   IR FLUORO GUIDE CV LINE RIGHT  07/24/2020   IR FLUORO GUIDE CV LINE RIGHT  07/21/2021   IR FLUORO GUIDE CV LINE RIGHT  05/20/2022   IR PTA VENOUS EXCEPT DIALYSIS CIRCUIT  05/21/2022   IR REMOVAL TUN CV CATH W/O FL  07/21/2020   IR REMOVAL TUN  CV CATH W/O FL  07/19/2021   IR REMOVAL TUN CV  CATH W/O FL  05/17/2022   IR US  GUIDE VASC ACCESS LEFT  05/21/2022   IR US  GUIDE VASC ACCESS RIGHT  07/24/2020   IR US  GUIDE VASC ACCESS RIGHT  07/21/2021   IR US  GUIDE VASC ACCESS RIGHT  05/20/2022   IR VENOCAVAGRAM SVC  05/21/2022   LEFT HEART CATH AND CORONARY ANGIOGRAPHY N/A 05/01/2021   Procedure: LEFT HEART CATH AND CORONARY ANGIOGRAPHY;  Surgeon: Ladona Heinz, MD;  Location: [facility] INVASIVE CV LAB;  Service: Cardiovascular;  Laterality: N/A;   LEFT HEART CATH AND CORONARY ANGIOGRAPHY N/A 05/02/2022   Procedure: LEFT HEART CATH AND CORONARY ANGIOGRAPHY;  Surgeon: Ladona Heinz, MD;  Location: [facility] INVASIVE CV LAB;  Service: Cardiovascular;  Laterality: N/A;   LOWER EXTREMITY ANGIOGRAM Left 07/26/2013   Procedure: LOWER EXTREMITY ANGIOGRAM;  Surgeon: Redell LITTIE Door, MD;  Location: [facility] CATH LAB;  Service: Cardiovascular;  Laterality: Left;    Allergies: Allergies[1]  Medications:   Prior to Admission medications  Medication Sig Start Date End Date Taking? Authorizing Provider  acetaminophen  (TYLENOL ) 650 MG CR tablet Take 1,950 mg by mouth every 8 (eight) hours as needed for pain.   Yes [provider]  albuterol  (VENTOLIN  HFA) 108 (90 Base) MCG/ACT inhaler Inhale 2 puffs into the lungs every 6 (six) hours as needed for wheezing or shortness of breath. 12/17/24  Yes Georgina Speaks, FNP  atorvastatin  (LIPITOR) 20 MG tablet Take 1 tablet (20 mg total) by mouth daily. 12/17/24 01/26/25 Yes Georgina Speaks, FNP  Biotin  5 MG CAPS Take 10 mg by mouth daily at 6 (six) AM.   Yes [provider]  ELIQUIS  5 MG TABS tablet TAKE 1 TABLET (5 MG TOTAL) BY MOUTH 2 (TWO) TIMES DAILY. 11/22/24  Yes Georgina Speaks, FNP  ezetimibe  (ZETIA ) 10 MG tablet Take 1 tablet (10 mg total) by mouth daily. 12/02/24 03/02/25 Yes Ladona Heinz, MD  nicotine  (NICODERM CQ  - DOSED IN MG/24 HOURS) 21 mg/24hr patch Place 1 patch (21 mg total) onto the skin daily. 01/04/25 01/04/26 Yes Georgina Speaks, FNP  omeprazole  (PRILOSEC) 20 MG capsule TAKE  1 CAPSULE (20 MG TOTAL) BY MOUTH DAILY. 11/22/24  Yes Georgina Speaks, FNP  sevelamer  carbonate (RENVELA ) 800 MG tablet Take 800 mg by mouth at bedtime. 10/27/23  Yes [provider]  sodium zirconium cyclosilicate  (LOKELMA ) 10 g PACK packet Take 10 g by mouth daily.   Yes [provider]  vitamin E  200 UNIT capsule Take 200 Units by mouth daily.   Yes [provider]  meclizine  (ANTIVERT ) 12.5 MG tablet Take 1 tablet (12.5 mg total) by mouth 3 (three) times daily as needed for dizziness. 09/18/23   Georgina Speaks, FNP  midodrine  (PROAMATINE ) 5 MG tablet Take 1 tablet (5 mg total) by mouth 2 (two) times daily as needed (hypotension). Take one tab before dialysis Patient not taking: Reported on 01/26/2025 12/02/24   Ladona Heinz, MD  multivitamin (RENA-VIT) TABS tablet Take 1 tablet by mouth at bedtime. Patient not taking: Reported on 01/26/2025    [provider]  Tenapanor  HCl, CKD, 30 MG TABS Take 1 tablet by mouth daily. Patient not taking: Reported on 01/26/2025    [provider]  Tiotropium Bromide-Olodaterol (STIOLTO RESPIMAT ) 2.5-2.5 MCG/ACT AERS Inhale 2 puffs into the lungs daily. Patient not taking: Reported on 01/26/2025 05/04/24   Hunsucker, Donnice SAUNDERS, MD  vitamin C  (ASCORBIC ACID ) 250 MG tablet Take 250 mg by  mouth daily. Patient not taking: Reported on 01/26/2025    [provider]    Discontinued Meds:   Medications Discontinued During This Encounter  Medication Reason   norepinephrine  (LEVOPHED ) 4mg  in (0.016 mg/mL) premix infusion    Tenapanor  HCl (CKD) TABS 1 tablet Patient has not taken in last 30 days    Social History:  reports that she has been smoking cigarettes. She started smoking about 52 years ago. She has a 25 pack-year smoking history. She has been exposed to tobacco smoke. She has quit using smokeless tobacco. She reports that she does not currently use alcohol. She reports that she does not currently use drugs after  having used the following drugs: Oxycodone .  Family History:   Family History  Problem Relation Age of Onset   Cancer Mother 33       breast and bone   Cancer Father 66       prostate   Hypertension Sister    Bleeding Disorder Sister    Hypertension Daughter    Cancer Cousin 20       breast cancer    Congestive Heart Failure Son    Breast cancer Neg Hx     Blood pressure (!) 115/42, pulse 84, temperature 98.2 F (36.8 C), temperature source Oral, resp. rate 13, height 5' 2 (1.575 m), weight 90.1 kg, SpO2 94%. Physical Exam: Expand All Collapse All  Reason for Consult: ESRD   Chief Complaint: Hypotension   Dialysis orders [facility] 3h 30 min MWF 2/2 400/600 EDW 90kg (recently has been leaving below this) Hb 14.8 1/21 not on ESA (last given 30mcg 09/13/24) Doxercalciferol     Assessment/Plan: ESRD -  on MWF.  Only got 2 hrs of treatment today and she's stating she doesn't want dialysis tomorrow, preferring to wait till Fri.  Will re-evaluate tomorrow and see if she's able to get her next HD on Fri; she's already below her EDW. Lengthy discussion again about permanent access which she continues to refuse; she will live and die with a catheter. She's had bad experiences with infiltrations previously.  Hypotension/volume  - BP in goal. Does not appear volume overloaded.  Under dry weight as mentioned above. Suspicious for infection; there was a small amount of pus expressed from the exit site.  Anemia of CKD - Hgb 16.7. No indication for ESA/iron  therapy with active infection; last ESA was given 09/13/24.   Secondary Hyperparathyroidism -  Ca in goal. Check phos. Continue VDRA and binders.   Nutrition - Renal diet w/fluid restrictions when advanced.  H/o COPD PAD s/p bypass Prior DVT/PE on Eliquis      HPI: Whitney Walker is an 69 y.o. female  ESRD on HD MWF at [facility].  Past medical history significant for HTN, chronic diastolic HF, COPD, CAD, PAD s/p left fem pop 2014,  Hx colon cancer in remission, OSA not on CPAP, OA, anxiety/depression, Hx DVT + PE on AC and history of hyperkalemia.  Patient has had to shorten treatments the past 2 treatments including today receiving only approximately 2 hours out of her 3-1/2-hour planned and prescribed treatment because of hypotension.  This does occur occasionally but response to fluid but unfortunately at this time she received 500 milliliters of isotonic fluid with no improvement.  Patient was found diastolic pressure of 60 transported to the emergency department.  Patient has had a cough, nausea, weakness since Saturday, missed dialysis on Monday and has had generalized abdominal pain.  Patient has chronic  diarrhea but denies any dark black tarry stool or hematochezia.  Patient does have some shortness of breath which is chronic and not any worse than usual.  Patient was given another fluid bolus in the emergency room and started on Levophed  .  CT of the chest abdomen pelvis was performed showing a solid 6 mm nodule inferiorly in the right lower lobe as well as a new left upper lobe nodule.  Changes with prior subtotal colectomy with no distention or wall thickening/inflammation.  Patient with noted discomfort around the catheter insertion site for a few days.     ROS Pertinent items are noted in HPI.   Chemistry and CBC: Last Labs         Creatinine  Date/Time Value Ref Range Status  06/22/2024 12:22 PM 8.16 (HH) 0.44 - 1.00 mg/dL Final      Comment:      REPEATED TO VERIFY CRITICAL RESULT CALLED TO, READ BACK BY AND VERIFIED WITH: Anders Seena PEAK at 1332 by Aldona [name] (MT) 06/22/2024     05/22/2023 11:27 AM 7.43 (HH) 0.44 - 1.00 mg/dL Final      Comment:      REPEATED TO VERIFY CRITICAL RESULT CALLED TO, READ BACK BY AND VERIFIED WITH: Anders Seena PEAK at 1227 pm by Aldona [name] (MT) 05/22/2023     04/21/2019 12:00 AM 10.7 (A) 0.5 - 1.1 Final  06/04/2018 12:00 AM 5.7 (A) 0.5 - 1.1 Final  07/07/2017 12:35 PM  3.4 (HH) 0.6 - 1.1 mg/dL Final  98/94/7981 90:70 AM 3.4 Repeated and Verified (HH) 0.6 - 1.1 mg/dL Final  91/92/7982 87:64 PM 2.6 (H) 0.6 - 1.1 mg/dL Final  98/86/7983 96:79 PM 1.9 (H) 0.6 - 1.1 mg/dL Final  89/68/7985 87:58 PM 1.6 (H) 0.6 - 1.1 mg/dL Final  87/93/7986 89:43 AM 1.7 (H) 0.6 - 1.1 mg/dL Final           Creatinine, Ser  Date/Time Value Ref Range Status  01/26/2025 09:41 AM 9.50 (H) 0.44 - 1.00 mg/dL Final  94/77/7974 93:83 AM 7.11 (H) 0.44 - 1.00 mg/dL Final      Comment:      DELTA CHECK NOTED  05/19/2024 10:19 AM 4.59 (H) 0.44 - 1.00 mg/dL Final  94/85/7974 93:53 AM 10.79 (H) 0.44 - 1.00 mg/dL Final  94/88/7974 92:79 AM 8.84 (H) 0.44 - 1.00 mg/dL Final  98/69/7974 97:42 PM 7.35 (H) 0.44 - 1.00 mg/dL Final  87/96/7975 93:91 AM 7.27 (H) 0.44 - 1.00 mg/dL Final  87/97/7975 90:69 PM 13.13 (H) 0.44 - 1.00 mg/dL Final  87/97/7975 98:94 PM 13.38 (H) 0.44 - 1.00 mg/dL Final  95/90/7975 93:56 PM 9.81 (H) 0.44 - 1.00 mg/dL Final  87/87/7976 87:99 PM 9.10 (H) 0.44 - 1.00 mg/dL Final  93/98/7976 95:95 PM 7.91 (H) 0.57 - 1.00 mg/dL Final  94/77/7976 98:68 AM 9.12 (H) 0.44 - 1.00 mg/dL Final  94/78/7976 91:92 PM 9.05 (H) 0.44 - 1.00 mg/dL Final      Comment:      DELTA CHECK NOTED  05/18/2022 04:33 AM 5.49 (H) 0.44 - 1.00 mg/dL Final      Comment:      DELTA CHECK NOTED  05/17/2022 12:47 AM 9.04 (H) 0.44 - 1.00 mg/dL Final  94/81/7976 97:92 PM 8.21 (H) 0.44 - 1.00 mg/dL Final  94/94/7976 98:81 AM 10.62 (H) 0.44 - 1.00 mg/dL Final  94/96/7976 89:80 PM 7.90 (H) 0.44 - 1.00 mg/dL Final  94/96/7976 98:42 PM 15.55 (H) 0.44 - 1.00 mg/dL Final  94/96/7976 91:89  AM 15.23 (H) 0.44 - 1.00 mg/dL Final  88/82/7977 88:54 AM 6.96 (H) 0.57 - 1.00 mg/dL Final  92/75/7977 87:53 AM 6.33 (H) 0.44 - 1.00 mg/dL Final  92/76/7977 98:79 AM 9.47 (H) 0.44 - 1.00 mg/dL Final  92/77/7977 98:50 AM 8.09 (H) 0.44 - 1.00 mg/dL Final  92/79/7977 88:54 PM 5.80 (H) 0.44 - 1.00 mg/dL Final      Comment:       DELTA CHECK NOTED  07/18/2021 02:30 PM 8.91 (H) 0.44 - 1.00 mg/dL Final  92/80/7977 95:91 AM 7.20 (H) 0.44 - 1.00 mg/dL Final  92/81/7977 97:97 PM 7.31 (H) 0.44 - 1.00 mg/dL Final  93/97/7977 97:72 AM 5.98 (H) 0.44 - 1.00 mg/dL Final      Comment:      DELTA CHECK NOTED  05/30/2021 01:19 AM 9.84 (H) 0.44 - 1.00 mg/dL Final  94/68/7977 87:61 AM 7.54 (H) 0.44 - 1.00 mg/dL Final      Comment:      DELTA CHECK NOTED DIALYSIS    05/28/2021 08:42 AM 12.78 (H) 0.44 - 1.00 mg/dL Final  94/80/7977 87:80 PM 8.60 (HH) 0.44 - 1.00 mg/dL Final      Comment:      CRITICAL RESULT CALLED TO, READ BACK BY AND VERIFIED WITH: karen beach at 1308 by hflynt 05/17/21   04/25/2021 11:28 AM 4.34 (H) 0.57 - 1.00 mg/dL Final      Comment:      **Verified by repeat analysis**  03/15/2021 11:43 AM 6.27 (H) 0.57 - 1.00 mg/dL Final  97/92/7977 96:58 PM 4.81 (H) 0.44 - 1.00 mg/dL Final  88/96/7978 87:64 AM 10.69 (H) 0.44 - 1.00 mg/dL Final  88/97/7978 87:72 AM 8.04 (H) 0.44 - 1.00 mg/dL Final      Comment:      DELTA CHECK NOTED  10/30/2020 04:07 AM 12.21 (H) 0.44 - 1.00 mg/dL Final  94/79/7978 89:63 AM 6.98 (HH) 0.44 - 1.00 mg/dL Final      Comment:      CRITICAL RESULT CALLED TO, READ BACK BY AND VERIFIED WITH: karen beach by hflynt 05/18/20   05/10/2020 06:31 AM 8.44 (H) 0.44 - 1.00 mg/dL Final  94/88/7978 87:53 PM 5.80 (H) 0.44 - 1.00 mg/dL Final      Comment:      DELTA CHECK NOTED DIALYSIS    05/09/2020 07:08 AM 13.59 (H) 0.44 - 1.00 mg/dL Final  94/88/7978 87:94 AM 13.20 (H) 0.44 - 1.00 mg/dL Final  88/80/7979 87:60 PM 8.25 (HH) 0.44 - 1.00 mg/dL Final      Comment:      CRITICAL RESULT CALLED TO, READ BACK BY AND VERIFIED WITH: Mazie Boyer, RN at 1334 by CHRISTELLA Rattler   03/03/2019 05:14 AM 6.40 (H) 0.44 - 1.00 mg/dL Final      Comment:      DELTA CHECK NOTED  03/02/2019 04:35 PM 15.49 (H) 0.44 - 1.00 mg/dL Final  97/81/7979 98:85 PM 6.45 (HH) 0.44 - 1.00 mg/dL Final      Comment:       CRITICAL RESULT CALLED TO, READ BACK BY AND VERIFIED WITH: CHERYL ANDERSON,RN    06/11/2018 12:41 PM 6.05 (HH) 0.60 - 1.10 mg/dL Final      Comment:      CRITICAL RESULT CALLED TO, READ BACK BY AND VERIFIED WITH: SHERILL   05/07/2018 04:26 AM 8.95 (H) 0.44 - 1.00 mg/dL Final  94/91/7980 95:75 AM 6.99 (H) 0.44 - 1.00 mg/dL Final      Last Labs  Recent Labs  Lab 01/26/25 0941  NA 133*  K 4.8  CL 103  GLUCOSE 92  BUN 41*  CREATININE 9.50*      Last Labs      Recent Labs  Lab 01/26/25 0931 01/26/25 0941  WBC 11.6*  --   NEUTROABS 8.7*  --   HGB 14.7 16.7*  HCT 44.8 49.0*  MCV 90.0  --   PLT 150  --       Liver Function Tests: Last Labs  No results for input(s): AST, ALT, ALKPHOS, BILITOT, PROT, ALBUMIN  in the last 168 hours.   Last Labs  No results for input(s): LIPASE, AMYLASE in the last 168 hours.   Last Labs  No results for input(s): AMMONIA in the last 168 hours.   Cardiac Enzymes: Last Labs  No results for input(s): CKTOTAL, CKMB, CKMBINDEX, TROPONINI in the last 168 hours.   Iron  Studies:  Recent Labs (last 2 labs)  No results for input(s): IRON , TIBC, TRANSFERRIN, FERRITIN in the last 72 hours.   PT/INR: @LABRCNTIP (inr:5)   Xrays/Other Studies: ) Lab Results Last 48 Hours        Results for orders placed or performed during the hospital encounter of 01/26/25 (from the past 48 hours)  Resp panel by RT-PCR (RSV, Flu A&B, Covid) Anterior Nasal Swab     Status: None    Collection Time: 01/26/25  9:08 AM    Specimen: Anterior Nasal Swab  Result Value Ref Range    SARS Coronavirus 2 by RT PCR NEGATIVE NEGATIVE    Influenza A by PCR NEGATIVE NEGATIVE    Influenza B by PCR NEGATIVE NEGATIVE      Comment: (NOTE) The Xpert Xpress SARS-CoV-2/FLU/RSV plus assay is intended as an aid in the diagnosis of influenza from Nasopharyngeal swab specimens and should not be used as a sole basis for treatment. Nasal washings  and aspirates are unacceptable for Xpert Xpress SARS-CoV-2/FLU/RSV testing.   Fact Sheet for Patients: bloggercourse.com   Fact Sheet for Healthcare Providers: seriousbroker.it   This test is not yet approved or cleared by the [place]  FDA and has been authorized for detection and/or diagnosis of SARS-CoV-2 by FDA under an Emergency Use Authorization (EUA). This EUA will remain in effect (meaning this test can be used) for the duration of the COVID-19 declaration under Section 564(b)(1) of the Act, 21 U.S.C. section 360bbb-3(b)(1), unless the authorization is terminated or revoked.        Resp Syncytial Virus by PCR NEGATIVE NEGATIVE      Comment: (NOTE) Fact Sheet for Patients: bloggercourse.com   Fact Sheet for Healthcare Providers: seriousbroker.it   This test is not yet approved or cleared by the [place]  FDA and has been authorized for detection and/or diagnosis of SARS-CoV-2 by FDA under an Emergency Use Authorization (EUA). This EUA will remain in effect (meaning this test can be used) for the duration of the COVID-19 declaration under Section 564(b)(1) of the Act, 21 U.S.C. section 360bbb-3(b)(1), unless the authorization is terminated or revoked.   Performed at [facility], [address] [place], [place] [address]    CBC with Differential     Status: Abnormal    Collection Time: 01/26/25  9:31 AM  Result Value Ref Range    WBC 11.6 (H) 4.0 - 10.5 K/uL    RBC 4.98 3.87 - 5.11 MIL/uL    Hemoglobin 14.7 12.0 - 15.0 g/dL    HCT 55.1 63.9 - 53.9 %  MCV 90.0 80.0 - 100.0 fL    MCH 29.5 26.0 - 34.0 pg    MCHC 32.8 30.0 - 36.0 g/dL    RDW 84.7 88.4 - 84.4 %    Platelets 150 150 - 400 K/uL    nRBC 0.0 0.0 - 0.2 %    Neutrophils Relative % 75 %    Neutro Abs 8.7 (H) 1.7 - 7.7 K/uL    Lymphocytes Relative 15 %    Lymphs Abs 1.8 0.7 - 4.0 K/uL     Monocytes Relative 7 %    Monocytes Absolute 0.8 0.1 - 1.0 K/uL    Eosinophils Relative 1 %    Eosinophils Absolute 0.1 0.0 - 0.5 K/uL    Basophils Relative 0 %    Basophils Absolute 0.1 0.0 - 0.1 K/uL    Immature Granulocytes 2 %    Abs Immature Granulocytes 0.17 (H) 0.00 - 0.07 K/uL      Comment: Performed at [facility], [address], [place], [place] [address]  I-stat chem 8, ED (not at [facility], [facility] or [facility])     Status: Abnormal    Collection Time: 01/26/25  9:41 AM  Result Value Ref Range    Sodium 133 (L) 135 - 145 mmol/L    Potassium 4.8 3.5 - 5.1 mmol/L    Chloride 103 98 - 111 mmol/L    BUN 41 (H) 8 - 23 mg/dL    Creatinine, Ser 0.49 (H) 0.44 - 1.00 mg/dL    Glucose, Bld 92 70 - 99 mg/dL      Comment: Glucose reference range applies only to samples taken after fasting for at least 8 hours.    Calcium , Ion 0.92 (L) 1.15 - 1.40 mmol/L    TCO2 18 (L) 22 - 32 mmol/L    Hemoglobin 16.7 (H) 12.0 - 15.0 g/dL    HCT 50.9 (H) 63.9 - 46.0 %  I-Stat CG4 Lactic Acid     Status: Abnormal    Collection Time: 01/26/25  9:42 AM  Result Value Ref Range    Lactic Acid, Venous 2.4 (HH) 0.5 - 1.9 mmol/L    Comment NOTIFIED PHYSICIAN    Glucose, capillary     Status: None    Collection Time: 01/26/25  2:02 PM  Result Value Ref Range    Glucose-Capillary 93 70 - 99 mg/dL      Comment: Glucose reference range applies only to samples taken after fasting for at least 8 hours.    *Note: Due to a large number of results and/or encounters for the requested time period, some results have not been displayed. A complete set of results can be found in Results Review.      Imaging Results (Last 48 hours)  DG Chest Portable 1 View Result Date: 01/26/2025 CLINICAL DATA:  Sepsis. EXAM: PORTABLE CHEST 1 VIEW COMPARISON:  05/19/2024 been CT chest 01/26/2025. FINDINGS: Trachea is midline. Heart size stable. Left IJ dialysis catheter tips are in the SVC and right atrium. Mild basilar streaky  opacification. No pleural fluid. IMPRESSION: Minimal bibasilar subsegmental atelectasis. Electronically Signed   By: Newell Eke M.D.   On: 01/26/2025 11:34    CT Angio Chest PE W and/or Wo Contrast Result Date: 01/26/2025 CLINICAL DATA:  history of DVT. Hypotension and tachy. Likely sepsis. Eval for source and PE; sepsis. Diarrhea. Pain in llq. Eval for diverticulitis EXAM: CT ANGIOGRAPHY CHEST CT ABDOMEN AND PELVIS WITH CONTRAST TECHNIQUE: Multidetector CT imaging of the chest was performed  using the standard protocol during bolus administration of intravenous contrast. Multiplanar CT image reconstructions and MIPs were obtained to evaluate the vascular anatomy. Multidetector CT imaging of the abdomen and pelvis was performed using the standard protocol during bolus administration of intravenous contrast. RADIATION DOSE REDUCTION: This exam was performed according to the departmental dose-optimization program which includes automated exposure control, adjustment of the mA and/or kV according to patient size and/or use of iterative reconstruction technique. CONTRAST:  75mL OMNIPAQUE  IOHEXOL  350 MG/ML SOLN COMPARISON:  Chest CT 05/19/2024 and 05/09/2024. Abdominopelvic CT 12/01/2023. FINDINGS: CTA CHEST FINDINGS Cardiovascular: The pulmonary arteries are well opacified with contrast to the level of the segmental branches. There is no evidence of acute pulmonary embolism. There is central enlargement of the pulmonary arteries. Diffuse atherosclerosis of the aorta, great vessels and coronary arteries with calcifications of the aortic valve. The heart size is normal. There is no pericardial effusion. Mediastinum/Nodes: There are no enlarged mediastinal, hilar or axillary lymph nodes. The thyroid  gland, trachea and esophagus demonstrate no significant findings. Lungs/Pleura: No pleural effusion or pneumothorax. Mild centrilobular emphysema and central airway thickening. Interval improvement in previously  demonstrated linear right lung atelectasis. There is a new part solid nodule inferiorly in the right lower lobe, measuring 6 x 6 mm on image 57/12. 3 mm left upper lobe nodule on image 37/12 appears new. No confluent airspace disease. Musculoskeletal/Chest wall: No chest wall mass or suspicious osseous findings. Multilevel spondylosis. CT ABDOMEN AND PELVIS FINDINGS Hepatobiliary: The liver is normal in density without suspicious focal abnormality. No significant biliary dilatation status post cholecystectomy. Pancreas: Unremarkable. No pancreatic ductal dilatation or surrounding inflammatory changes. Spleen: Stable splenic lobularity and scarring. No focal abnormality. Adrenals/Urinary Tract: Both adrenal glands appear normal. No evidence of urinary tract calculus, hydronephrosis or perinephric soft tissue stranding. The kidneys appear unchanged with cortical thinning and simple appearing cysts bilaterally for which no specific follow-up imaging is recommended. The urinary bladder is nearly empty and suboptimally evaluated. Stomach/Bowel: No enteric contrast administered. Stable small hiatal hernia. The stomach otherwise appears unremarkable for its degree of distention. Stable postsurgical changes from subtotal colectomy with ileorectal anastomosis. The rectum is fluid-filled without significant distension, wall thickening or surrounding inflammation. Vascular/Lymphatic: There are no enlarged abdominal or pelvic lymph nodes. Aortic and branch vessel atherosclerosis without evidence of aneurysm or large vessel occlusion. Reproductive: Status post hysterectomy. No evidence of adnexal mass. Other: Postsurgical changes in the anterior abdominal wall with stable small umbilical hernia containing only fat. No ascites or pneumoperitoneum. Musculoskeletal: No acute or significant osseous findings. Review of the MIP images confirms the above findings. IMPRESSION: 1. No evidence of acute pulmonary embolism or other acute  vascular findings in the chest. 2. New part solid 6 mm nodule inferiorly in the right lower lobe and new 3 mm left upper lobe nodule, indeterminate. Follow-up non-contrast CT recommended at 3-6 months to confirm persistence. If unchanged, and solid component remains <6 mm, annual CT is recommended until 5 years of stability has been established. If persistent these nodules should be considered highly suspicious if the solid component of the nodule is 6 mm or greater in size and enlarging. This recommendation follows the consensus statement: Guidelines for Management of Incidental Pulmonary Nodules Detected on CT Images: From the Fleischner Society 2017; Radiology 2017; 284:228-243. 3. No acute findings in the abdomen or pelvis. 4. Stable postsurgical changes from subtotal colectomy with ileorectal anastomosis. The rectum is fluid-filled without significant distension, wall thickening or surrounding inflammation. 5. Aortic Atherosclerosis (  ICD10-I70.0) and Emphysema (ICD10-J43.9). Electronically Signed   By: Elsie Perone M.D.   On: 01/26/2025 10:48    CT ABDOMEN PELVIS W CONTRAST Result Date: 01/26/2025 CLINICAL DATA:  history of DVT. Hypotension and tachy. Likely sepsis. Eval for source and PE; sepsis. Diarrhea. Pain in llq. Eval for diverticulitis EXAM: CT ANGIOGRAPHY CHEST CT ABDOMEN AND PELVIS WITH CONTRAST TECHNIQUE: Multidetector CT imaging of the chest was performed using the standard protocol during bolus administration of intravenous contrast. Multiplanar CT image reconstructions and MIPs were obtained to evaluate the vascular anatomy. Multidetector CT imaging of the abdomen and pelvis was performed using the standard protocol during bolus administration of intravenous contrast. RADIATION DOSE REDUCTION: This exam was performed according to the departmental dose-optimization program which includes automated exposure control, adjustment of the mA and/or kV according to patient size and/or use of  iterative reconstruction technique. CONTRAST:  75mL OMNIPAQUE  IOHEXOL  350 MG/ML SOLN COMPARISON:  Chest CT 05/19/2024 and 05/09/2024. Abdominopelvic CT 12/01/2023. FINDINGS: CTA CHEST FINDINGS Cardiovascular: The pulmonary arteries are well opacified with contrast to the level of the segmental branches. There is no evidence of acute pulmonary embolism. There is central enlargement of the pulmonary arteries. Diffuse atherosclerosis of the aorta, great vessels and coronary arteries with calcifications of the aortic valve. The heart size is normal. There is no pericardial effusion. Mediastinum/Nodes: There are no enlarged mediastinal, hilar or axillary lymph nodes. The thyroid  gland, trachea and esophagus demonstrate no significant findings. Lungs/Pleura: No pleural effusion or pneumothorax. Mild centrilobular emphysema and central airway thickening. Interval improvement in previously demonstrated linear right lung atelectasis. There is a new part solid nodule inferiorly in the right lower lobe, measuring 6 x 6 mm on image 57/12. 3 mm left upper lobe nodule on image 37/12 appears new. No confluent airspace disease. Musculoskeletal/Chest wall: No chest wall mass or suspicious osseous findings. Multilevel spondylosis. CT ABDOMEN AND PELVIS FINDINGS Hepatobiliary: The liver is normal in density without suspicious focal abnormality. No significant biliary dilatation status post cholecystectomy. Pancreas: Unremarkable. No pancreatic ductal dilatation or surrounding inflammatory changes. Spleen: Stable splenic lobularity and scarring. No focal abnormality. Adrenals/Urinary Tract: Both adrenal glands appear normal. No evidence of urinary tract calculus, hydronephrosis or perinephric soft tissue stranding. The kidneys appear unchanged with cortical thinning and simple appearing cysts bilaterally for which no specific follow-up imaging is recommended. The urinary bladder is nearly empty and suboptimally evaluated.  Stomach/Bowel: No enteric contrast administered. Stable small hiatal hernia. The stomach otherwise appears unremarkable for its degree of distention. Stable postsurgical changes from subtotal colectomy with ileorectal anastomosis. The rectum is fluid-filled without significant distension, wall thickening or surrounding inflammation. Vascular/Lymphatic: There are no enlarged abdominal or pelvic lymph nodes. Aortic and branch vessel atherosclerosis without evidence of aneurysm or large vessel occlusion. Reproductive: Status post hysterectomy. No evidence of adnexal mass. Other: Postsurgical changes in the anterior abdominal wall with stable small umbilical hernia containing only fat. No ascites or pneumoperitoneum. Musculoskeletal: No acute or significant osseous findings. Review of the MIP images confirms the above findings. IMPRESSION: 1. No evidence of acute pulmonary embolism or other acute vascular findings in the chest. 2. New part solid 6 mm nodule inferiorly in the right lower lobe and new 3 mm left upper lobe nodule, indeterminate. Follow-up non-contrast CT recommended at 3-6 months to confirm persistence. If unchanged, and solid component remains <6 mm, annual CT is recommended until 5 years of stability has been established. If persistent these nodules should be considered highly suspicious if the solid  component of the nodule is 6 mm or greater in size and enlarging. This recommendation follows the consensus statement: Guidelines for Management of Incidental Pulmonary Nodules Detected on CT Images: From the Fleischner Society 2017; Radiology 2017; 284:228-243. 3. No acute findings in the abdomen or pelvis. 4. Stable postsurgical changes from subtotal colectomy with ileorectal anastomosis. The rectum is fluid-filled without significant distension, wall thickening or surrounding inflammation. 5. Aortic Atherosclerosis (ICD10-I70.0) and Emphysema (ICD10-J43.9). Electronically Signed   By: Elsie Perone M.D.    On: 01/26/2025 10:48        PMH:       Past Medical History:  Diagnosis Date   Acute left-sided low back pain without sciatica 02/22/2024   Anemia     Anxiety     CHF (congestive heart failure) (HCC)     Colon cancer (HCC)      treatment surgery   Complication of anesthesia      after first [place]- [place] couldnt walk after, patient denies having a spinal   COPD (chronic obstructive pulmonary disease) (HCC)     Coronary artery disease     Depression     DVT (deep venous thrombosis) (HCC)     Encounter for annual health examination 08/05/2023   ESRD on hemodialysis (HCC)      Hemo: MWF   History of blood transfusion 04/2018   Hypertension      07/07/18- no longer takes BP medications   Left hip pain 05/16/2022   Left leg pain 05/09/2020   Leucocytosis 04/28/2012   Meningitis     Orthostatic hypotension 05/16/2022   Pain in limb 07/30/2013   PE (pulmonary embolism)     Peripheral vascular disease     Primary hypertension 11/15/2013   Rash 11/25/2023   Restless legs     Shortness of breath      with exertion   Sleep apnea     SOB (shortness of breath) 03/03/2019   Vertigo            PSH:        Past Surgical History:  Procedure Laterality Date   ABDOMINAL AORTAGRAM N/A 07/26/2013    Procedure: ABDOMINAL EZELLA;  Surgeon: Redell LITTIE Door, MD;  Location: [facility] CATH LAB;  Service: Cardiovascular;  Laterality: N/A;   ABDOMINAL HYSTERECTOMY       AV FISTULA PLACEMENT Left 05/05/2018    Procedure: ARTERIOVENOUS (AV) FISTULA CREATION BRACHIOCEPHALIC;  Surgeon: Sheree Penne Bruckner, MD;  Location: [facility] OR;  Service: Vascular;  Laterality: Left;   AV FISTULA PLACEMENT Left 07/16/2018    Procedure: ARTERIOVENOUS FISTULA CREATION;  Surgeon: Sheree Penne Bruckner, MD;  Location: [facility] OR;  Service: Vascular;  Laterality: Left;   BASCILIC VEIN TRANSPOSITION Left 09/03/2018    Procedure: BASILIC VEIN TRANSPOSITION SECOND STAGE;  Surgeon: Sheree Penne Bruckner, MD;  Location: [facility]  OR;  Service: Vascular;  Laterality: Left;   BREAST BIOPSY Left     CESAREAN SECTION        X 3 1974-1977   CHOLECYSTECTOMY       CHOLECYSTECTOMY   1980/s   COLECTOMY   2010   DIVERTICULOSIS SURGERY-2002   2012   FEMORAL-POPLITEAL BYPASS GRAFT Left 08/13/2013    Procedure: BYPASS GRAFT FEMORAL-POPLITEAL ARTERY WITH NON-REVERSED SAPHANEOUS VEIN; ULTRASOUND GUIDED;  Surgeon: Lynwood JONETTA Collum, MD;  Location: [facility] OR;  Service: Vascular;  Laterality: Left;   FLEXIBLE SIGMOIDOSCOPY N/A 12/10/2022    Procedure: FLEXIBLE SIGMOIDOSCOPY;  Surgeon: Rollin Dover, MD;  Location: [facility] ENDOSCOPY;  Service: Gastroenterology;  Laterality: N/A;   INSERTION OF DIALYSIS CATHETER Right 04/30/2018    Procedure: INSERTION OF Right Internal Jugular DIALYSIS CATHETER;  Surgeon: Serene Gaile ORN, MD;  Location: [facility] OR;  Service: Vascular;  Laterality: Right;   IR FLUORO GUIDE CV LINE LEFT   05/01/2022   IR FLUORO GUIDE CV LINE LEFT   05/21/2022   IR FLUORO GUIDE CV LINE RIGHT   07/24/2020   IR FLUORO GUIDE CV LINE RIGHT   07/21/2021   IR FLUORO GUIDE CV LINE RIGHT   05/20/2022   IR PTA VENOUS EXCEPT DIALYSIS CIRCUIT   05/21/2022   IR REMOVAL TUN CV CATH W/O FL   07/21/2020   IR REMOVAL TUN CV CATH W/O FL   07/19/2021   IR REMOVAL TUN CV CATH W/O FL   05/17/2022   IR US  GUIDE VASC ACCESS LEFT   05/21/2022   IR US  GUIDE VASC ACCESS RIGHT   07/24/2020   IR US  GUIDE VASC ACCESS RIGHT   07/21/2021   IR US  GUIDE VASC ACCESS RIGHT   05/20/2022   IR VENOCAVAGRAM SVC   05/21/2022   LEFT HEART CATH AND CORONARY ANGIOGRAPHY N/A 05/01/2021    Procedure: LEFT HEART CATH AND CORONARY ANGIOGRAPHY;  Surgeon: Ladona Heinz, MD;  Location: [facility] INVASIVE CV LAB;  Service: Cardiovascular;  Laterality: N/A;   LEFT HEART CATH AND CORONARY ANGIOGRAPHY N/A 05/02/2022    Procedure: LEFT HEART CATH AND CORONARY ANGIOGRAPHY;  Surgeon: Ladona Heinz, MD;  Location: [facility] INVASIVE CV LAB;  Service: Cardiovascular;  Laterality: N/A;   LOWER EXTREMITY ANGIOGRAM Left 07/26/2013     Procedure: LOWER EXTREMITY ANGIOGRAM;  Surgeon: Redell LITTIE Door, MD;  Location: [facility] CATH LAB;  Service: Cardiovascular;  Laterality: Left;          Allergies: [Allergies]  [Allergies]      Allergen Reactions   Difelikefalin Itching   Clindamycin /Lincomycin Diarrhea and Itching   Gadolinium Derivatives Hives and Other (See Comments)      HIVES, Desc: HIVES W/ DYE USED FOR 1ST CT SCAN BUT NOT 2ND, NO PREMEDS USED, PT UNCERTAIN OF CIRCUMSTANCES,,?POSSIBLE MRI CONTRAST ALLERGY, ALL STUDIES DONE SOMEWHERE IN [place] //A.C., Onset Date: [id number]     Medications:          Prior to Admission medications  Medication Sig Start Date End Date Taking? Authorizing Provider  acetaminophen  (TYLENOL ) 650 MG CR tablet Take 1,950 mg by mouth every 8 (eight) hours as needed for pain.     Yes [provider]  albuterol  (VENTOLIN  HFA) 108 (90 Base) MCG/ACT inhaler Inhale 2 puffs into the lungs every 6 (six) hours as needed for wheezing or shortness of breath. 12/17/24   Yes Georgina Speaks, FNP  atorvastatin  (LIPITOR) 20 MG tablet Take 1 tablet (20 mg total) by mouth daily. 12/17/24 01/26/25 Yes Georgina Speaks, FNP  Biotin  5 MG CAPS Take 10 mg by mouth daily at 6 (six) AM.     Yes [provider]  ELIQUIS  5 MG TABS tablet TAKE 1 TABLET (5 MG TOTAL) BY MOUTH 2 (TWO) TIMES DAILY. 11/22/24   Yes Georgina Speaks, FNP  ezetimibe  (ZETIA ) 10 MG tablet Take 1 tablet (10 mg total) by mouth daily. 12/02/24 03/02/25 Yes Ladona Heinz, MD  nicotine  (NICODERM CQ  - DOSED IN MG/24 HOURS) 21 mg/24hr patch Place 1 patch (21 mg total) onto the skin daily. 01/04/25 01/04/26 Yes Georgina Speaks, FNP  omeprazole  (PRILOSEC) 20 MG capsule TAKE 1 CAPSULE (20 MG TOTAL) BY MOUTH DAILY. 11/22/24  Yes Georgina Speaks, FNP  sevelamer  carbonate (RENVELA ) 800 MG tablet Take 800 mg by mouth at bedtime. 10/27/23   Yes [provider]  sodium zirconium cyclosilicate  (LOKELMA ) 10 g PACK packet Take 10 g by mouth daily.     Yes  [provider]  vitamin E  200 UNIT capsule Take 200 Units by mouth daily.     Yes [provider]  meclizine  (ANTIVERT ) 12.5 MG tablet Take 1 tablet (12.5 mg total) by mouth 3 (three) times daily as needed for dizziness. 09/18/23     Georgina Speaks, FNP  midodrine  (PROAMATINE ) 5 MG tablet Take 1 tablet (5 mg total) by mouth 2 (two) times daily as needed (hypotension). Take one tab before dialysis Patient not taking: Reported on 01/26/2025 12/02/24     Ladona Heinz, MD  multivitamin (RENA-VIT) TABS tablet Take 1 tablet by mouth at bedtime. Patient not taking: Reported on 01/26/2025       [provider]  Tenapanor  HCl, CKD, 30 MG TABS Take 1 tablet by mouth daily. Patient not taking: Reported on 01/26/2025       [provider]  Tiotropium Bromide-Olodaterol (STIOLTO RESPIMAT ) 2.5-2.5 MCG/ACT AERS Inhale 2 puffs into the lungs daily. Patient not taking: Reported on 01/26/2025 05/04/24     Hunsucker, Donnice SAUNDERS, MD  vitamin C  (ASCORBIC ACID ) 250 MG tablet Take 250 mg by mouth daily. Patient not taking: Reported on 01/26/2025       [provider]      Discontinued Meds:       Medications Discontinued During This Encounter  Medication Reason   norepinephrine  (LEVOPHED ) 4mg  in (0.016 mg/mL) premix infusion        Social History:  reports that she has been smoking cigarettes. She started smoking about 52 years ago. She has a 25 pack-year smoking history. She has been exposed to tobacco smoke. She has quit using smokeless tobacco. She reports that she does not currently use alcohol. She reports that she does not currently use drugs after having used the following drugs: Oxycodone .   Family History:        Family History  Problem Relation Age of Onset   Cancer Mother 76        breast and bone   Cancer Father 65        prostate   Hypertension Sister     Bleeding Disorder Sister     Hypertension Daughter     Cancer Cousin 20        breast cancer     Congestive Heart Failure Son     Breast cancer Neg Hx            Blood pressure (!) 130/57, pulse (!) 104, temperature (!) 97.5 F (36.4 C), temperature source Oral, resp. rate 19, height 5' 2 (1.575 m), weight 90.1 kg, SpO2 98%. General: WDWN in NAD Head: NCAT sclera not icteric MMM Neck: Supple. No lymphadenopathy Lungs: CTA bilaterally. No wheeze, rales or rhonchi. Breathing is unlabored on RA Heart: RRR. No murmur, rubs or gallops.  Abdomen: soft, +epigastric tenderness, non distended Lower extremities:no edema, ischemic changes, or open wounds  Neuro: AAOx3. Moves all extremities spontaneously. Psych:  Responds to questions appropriately with a normal affect. Dialysis Access: [facility], very small amount of pus expressed from exit site         MELIA LYNWOOD ORN, MD 01/27/2025, 7:29 AM      [1]  Allergies Allergen Reactions   Difelikefalin Itching  Clindamycin /Lincomycin Diarrhea and Itching   Gadolinium Derivatives Hives and Other (See Comments)    HIVES, Desc: HIVES W/ DYE USED FOR 1ST CT SCAN BUT NOT 2ND, NO PREMEDS USED, PT UNCERTAIN OF CIRCUMSTANCES,,?POSSIBLE MRI CONTRAST ALLERGY, ALL STUDIES DONE SOMEWHERE IN [place] //A.C., Onset Date: [id number]

## 2025-01-27 NOTE — Progress Notes (Signed)
 eLink Physician-Brief Progress Note Patient Name: Whitney Walker DOB: [birth year] MRN: [id number]   Date of Service  01/27/2025  HPI/Events of Note  Notified that patient complaining of right arm burning sensation. The only IV that went through that arm was cefepime  a few hours ago. Pulses adequate, no swelling, no rash, no weakness  On review K 6.1 3 hours ago  eICU Interventions  Ordered hyperkalemia protocol and monitor the arm discomfort with treatment of hyperkalemia Nephrology to be informed for need of possible HD Discussed with [facility]     Intervention Category Intermediate Interventions: Other:  Damien ONEIDA Grout 01/27/2025, 5:37 AM

## 2025-01-27 NOTE — Progress Notes (Signed)
 Pt receives out-pt HD at [facility], MWF 0530am chair time. Will continue to assist as needed.    Lavanda Bladimir Auman Dialysis Navigator [phone]

## 2025-01-27 NOTE — TOC Progression Note (Incomplete)
 Transition of Care [facility]) - Progression Note    Patient Details  Name: Jazleen Robeck MRN: [id number] Date of Birth: 1956/02/26  Transition of Care [facility] CM/SW Contact  Landry DELENA Senters, RN Phone Number: 01/27/2025, 3:07 PM  Clinical Narrative:                         Expected Discharge Plan and Services                                               Social Drivers of Health (SDOH) Interventions SDOH Screenings   Food Insecurity: No Food Insecurity (01/27/2025)  Housing: Low Risk (01/27/2025)  Transportation Needs: No Transportation Needs (01/27/2025)  Utilities: Not At Risk (01/27/2025)  Alcohol Screen: Low Risk (03/30/2024)  Depression (PHQ2-9): Low Risk (06/22/2024)  Financial Resource Strain: Low Risk (03/30/2024)  Physical Activity: Insufficiently Active (05/06/2024)  Social Connections: Moderately Isolated (01/27/2025)  Stress: No Stress Concern Present (03/30/2024)  Tobacco Use: High Risk (01/26/2025)  Health Literacy: Adequate Health Literacy (05/06/2024)    Readmission Risk Interventions    05/20/2024    2:36 PM 05/21/2022    4:20 PM  Readmission Risk Prevention Plan  Transportation Screening Complete Complete  PCP or Specialist Appt within 3-5 Days Complete   HRI or Home Care Consult Complete   Social Work Consult for Recovery Care Planning/Counseling Complete   Palliative Care Screening Not Applicable   Medication Review Oceanographer) Referral to Pharmacy Referral to Pharmacy  HRI or Home Care Consult  Complete  SW Recovery Care/Counseling Consult  Complete  Palliative Care Screening  Not Applicable  Skilled Nursing Facility  Not Applicable
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
## 2025-01-28 ENCOUNTER — Inpatient Hospital Stay (HOSPITAL_COMMUNITY)

## 2025-01-28 DIAGNOSIS — I953 Hypotension of hemodialysis: Secondary | ICD-10-CM | POA: Diagnosis not present

## 2025-01-28 DIAGNOSIS — A419 Sepsis, unspecified organism: Secondary | ICD-10-CM | POA: Diagnosis not present

## 2025-01-28 DIAGNOSIS — I951 Orthostatic hypotension: Secondary | ICD-10-CM | POA: Diagnosis not present

## 2025-01-28 DIAGNOSIS — I739 Peripheral vascular disease, unspecified: Secondary | ICD-10-CM | POA: Diagnosis not present

## 2025-01-28 LAB — HEPATITIS B SURFACE ANTIBODY, QUANTITATIVE: Hep B S AB Quant (Post): 1957 m[IU]/mL

## 2025-01-28 MED ORDER — HEPARIN SODIUM (PORCINE) 1000 UNIT/ML IJ SOLN
INTRAMUSCULAR | Status: AC
  Filled 2025-01-28: qty 5

## 2025-01-28 MED ORDER — HEPARIN SODIUM (PORCINE) 1000 UNIT/ML IJ SOLN
INTRAMUSCULAR | Status: AC
  Filled 2025-01-28: qty 10

## 2025-01-28 MED ORDER — CHLORHEXIDINE GLUCONATE 4 % EX SOLN
CUTANEOUS | Status: AC
  Filled 2025-01-28: qty 15

## 2025-01-28 MED ORDER — TRAZODONE HCL 50 MG PO TABS
25.0000 mg | ORAL_TABLET | Freq: Every evening | ORAL | Status: DC | PRN
  Administered 2025-01-28 – 2025-01-30 (×3): 25 mg via ORAL
  Filled 2025-01-28 (×4): qty 1

## 2025-01-28 MED ORDER — LIDOCAINE-EPINEPHRINE 1 %-1:100000 IJ SOLN
INTRAMUSCULAR | Status: AC
  Filled 2025-01-28: qty 1

## 2025-01-28 NOTE — Progress Notes (Incomplete)
 " PROGRESS NOTE    Whitney Walker  FMW:[id number] DOB: [birth year] DOA: 01/26/2025 PCP: Georgina Speaks, FNP    Chief Complaint  Patient presents with   Hypotension   Weakness    Brief Narrative:  Whitney Walker is a 69 y/o female with a hx of ESRD on HD, diastolic CHF,    Assessment & Plan: Septic Shock Initially thought to be septic due to weakness and hypotension after dialysis.    ESRD on HD Deferred to dialysis, MWF schedule   Chronic Hypotension Primarily becomes hypotensive during dialysis Midodrine  for hypotensive episodes  HLD Continue lipitor and ezetimibe   Hyponatremia  COPD No acute presentation, stable Use bronchodilators for wheezing  OSA Intolerant to CPAP  History of VTE Continue eliquis  Using SCDs starting (1/28)  Nodules in Lungs- Incidental Finding Repeat Chest CT in 3-6 months, 3mm and 6mm nodules  Obesity Class 2- calculated at 36.45   DVT prophylaxis: On eliquis  for DVTs, heparin  Code Status: Full Family Communication: None at bedside  Consultants:  N/A  Procedures:  Receiving HD at this time. (MWF schedule)  Antimicrobials:  Vancomycin  started Friday (1/30)   Subjective: Pt is found AxOx4, lying in bed in dialysis center, receiving dialysis with no complaints, and very pleasant demeanor. She denies any issues at this time, states she feels well. Denies chest pain, headache, lightheadedness, abdominal pain, sob, nausea and vomiting and pain to chest near catheter site.   Objective: Vitals:   01/28/25 0934 01/28/25 1000 01/28/25 1036 01/28/25 1106  BP: (!) 103/55 126/73 (!) 95/57 102/73  Pulse: 93 90 89 93  Resp: 13 14 14 12   Temp:      TempSrc:      SpO2: 100% 100% 100% 100%  Weight:      Height:       No intake or output data in the 24 hours ending 01/28/25 1147 Filed Weights   01/26/25 0901 01/26/25 1400 01/28/25 0500  Weight: 92 kg 90.1 kg 94 kg    Examination:  General exam: Appears calm and  comfortable. Respiratory system: Clear to auscultation. Respiratory effort normal. Cardiovascular system: S1 & S2 heard, RRR. No JVD, murmurs, rubs, gallops or clicks. No pedal edema. Gastrointestinal system: Abdomen is nondistended, soft and nontender.  Central nervous system: Alert and oriented. No focal neurological deficits. Extremities: Symmetric 5 x 5 power. Skin: No rashes, lesions or ulcers. Approximately 1 cm away from dialysis site there was a small boil that had been covered.      Data Reviewed: I have personally reviewed following labs and imaging studies  CBC: Recent Labs  Lab 01/26/25 0931 01/26/25 0941 01/27/25 0208  WBC 11.6*  --  11.2*  NEUTROABS 8.7*  --   --   HGB 14.7 16.7* 14.7  HCT 44.8 49.0* 48.5*  MCV 90.0  --  93.0  PLT 150  --  150    Basic Metabolic Panel: Recent Labs  Lab 01/26/25 0941 01/26/25 1436 01/27/25 0208 01/27/25 0614  NA 133* 130* 128* 129*  K 4.8 5.1 6.1* 5.4*  CL 103 92* 95* 95*  CO2  --  14* 13* 15*  GLUCOSE 92 86 123* 124*  BUN 41* 39* 46* 48*  CREATININE 9.50* 9.50* 11.40* 11.40*  CALCIUM   --  8.7* 8.1* 8.4*  MG  --  2.2 2.2  --   PHOS  --   --  6.5*  --     GFR: Estimated Creatinine Clearance: 5 mL/min (A) (by C-G formula based on  SCr of 11.4 mg/dL (H)).  Liver Function Tests: Recent Labs  Lab 01/26/25 1436  AST 32  ALT 26  ALKPHOS 130*  BILITOT 1.0  PROT 7.8  ALBUMIN  3.7    CBG: Recent Labs  Lab 01/26/25 1402 01/26/25 2002 01/27/25 0826 01/27/25 1139  GLUCAP 93 232* 174* 109*     Recent Results (from the past 240 hours)  Blood culture (routine x 2)     Status: None (Preliminary result)   Collection Time: 01/26/25  9:08 AM   Specimen: BLOOD  Result Value Ref Range Status   Specimen Description BLOOD SITE NOT SPECIFIED  Final   Special Requests   Final    BOTTLES DRAWN AEROBIC AND ANAEROBIC Blood Culture results may not be optimal due to an inadequate volume of blood received in culture bottles    Culture   Final    NO GROWTH 2 DAYS Performed at [facility], [address], [place], [place] [address]    Report Status PENDING  Incomplete  Resp panel by RT-PCR (RSV, Flu A&B, Covid) Anterior Nasal Swab     Status: None   Collection Time: 01/26/25  9:08 AM   Specimen: Anterior Nasal Swab  Result Value Ref Range Status   SARS Coronavirus 2 by RT PCR NEGATIVE NEGATIVE Final   Influenza A by PCR NEGATIVE NEGATIVE Final   Influenza B by PCR NEGATIVE NEGATIVE Final    Comment: (NOTE) The Xpert Xpress SARS-CoV-2/FLU/RSV plus assay is intended as an aid in the diagnosis of influenza from Nasopharyngeal swab specimens and should not be used as a sole basis for treatment. Nasal washings and aspirates are unacceptable for Xpert Xpress SARS-CoV-2/FLU/RSV testing.  Fact Sheet for Patients: bloggercourse.com  Fact Sheet for Healthcare Providers: seriousbroker.it  This test is not yet approved or cleared by the [place]  FDA and has been authorized for detection and/or diagnosis of SARS-CoV-2 by FDA under an Emergency Use Authorization (EUA). This EUA will remain in effect (meaning this test can be used) for the duration of the COVID-19 declaration under Section 564(b)(1) of the Act, 21 U.S.C. section 360bbb-3(b)(1), unless the authorization is terminated or revoked.     Resp Syncytial Virus by PCR NEGATIVE NEGATIVE Final    Comment: (NOTE) Fact Sheet for Patients: bloggercourse.com  Fact Sheet for Healthcare Providers: seriousbroker.it  This test is not yet approved or cleared by the [place]  FDA and has been authorized for detection and/or diagnosis of SARS-CoV-2 by FDA under an Emergency Use Authorization (EUA). This EUA will remain in effect (meaning this test can be used) for the duration of the COVID-19 declaration under Section 564(b)(1) of the Act, 21  U.S.C. section 360bbb-3(b)(1), unless the authorization is terminated or revoked.  Performed at [facility], [address], [place], [place] [address]   Blood culture (routine x 2)     Status: None (Preliminary result)   Collection Time: 01/26/25  9:13 AM   Specimen: Site Not Specified; Blood  Result Value Ref Range Status   Specimen Description SITE NOT SPECIFIED  Final   Special Requests   Final    BOTTLES DRAWN AEROBIC AND ANAEROBIC Blood Culture adequate volume   Culture   Final    NO GROWTH 2 DAYS Performed at [facility], [address], [place], [place] [address]    Report Status PENDING  Incomplete  MRSA Next Gen by PCR, Nasal     Status: None   Collection Time: 01/26/25 12:42  PM   Specimen: Nasal Mucosa; Nasal Swab  Result Value Ref Range Status   MRSA by PCR Next Gen NOT DETECTED NOT DETECTED Final    Comment: (NOTE) The GeneXpert MRSA Assay (FDA approved for NASAL specimens only), is one component of a comprehensive MRSA colonization surveillance program. It is not intended to diagnose MRSA infection nor to guide or monitor treatment for MRSA infections. Test performance is not FDA approved in patients less than 7 years old. Performed at [facility], [address], [place], [place] [address]          Radiology Studies: DG Chest Port 1 View Result Date: 01/27/2025 EXAM: 1 VIEW(S) XRAY OF THE CHEST 01/27/2025 04:47:00 AM COMPARISON: 01/26/2025 CLINICAL HISTORY: Sepsis. FINDINGS: LINES, TUBES AND DEVICES: Stable left chest tunneled dialysis catheter. LUNGS AND PLEURA: No focal pulmonary opacity. No pleural effusion. No pneumothorax. HEART AND MEDIASTINUM: No acute abnormality of the cardiac and mediastinal silhouettes. BONES AND SOFT TISSUES: No acute osseous abnormality. IMPRESSION: 1. No acute cardiopulmonary findings. 2. Stable left chest tunneled dialysis catheter. Electronically signed by: Timothy Berrigan MD 01/27/2025 05:16 AM EST RP  Workstation: [id number]        Scheduled Meds:  apixaban   5 mg Oral BID   vitamin C   250 mg Oral Daily   atorvastatin   20 mg Oral Daily   Chlorhexidine  Gluconate Cloth  6 each Topical Q0600   ezetimibe   10 mg Oral Daily   midodrine   5 mg Oral TID WC   nicotine   21 mg Transdermal Q24H   pantoprazole   40 mg Oral Daily   sevelamer  carbonate  800 mg Oral Q supper   vitamin E   200 Units Oral Daily   Continuous Infusions:  anticoagulant sodium citrate      vancomycin  1,000 mg (01/28/25 1045)     LOS: 2 days    Time spent: 25 minutes    Ofilia Rayon Delene Kass, PA-S [facility]   To contact the attending provider between 7A-7P or the covering provider during after hours 7P-7A, please log into the web site [email or web address] and access using universal Catharine password for that web site. If you do not have the password, please call the hospital operator.  01/28/2025, 11:47 AM   "

## 2025-01-28 NOTE — Plan of Care (Signed)

## 2025-01-28 NOTE — Progress Notes (Signed)
" °   01/28/25 1204  Vitals  Temp (!) 97.5 F (36.4 C)  Pulse Rate (!) 102  Resp 16  BP (!) 92/54  SpO2 100 %  O2 Device Nasal Cannula  Weight 90.4 kg  Type of Weight Post-Dialysis  Oxygen  Therapy  O2 Flow Rate (L/min) 2 L/min  Patient Activity (if Appropriate) In bed  Pulse Oximetry Type Continuous  Oximetry Probe Site Changed No  Post Treatment  Dialyzer Clearance Lightly streaked  Liters Processed 57.1  Fluid Removed (mL) 0 mL  Tolerated HD Treatment Yes  Post-Hemodialysis Comments Pt. tolerated tx well    "

## 2025-01-28 NOTE — Evaluation (Signed)
 Physical Therapy Evaluation and discharge Note Patient Details Name: Whitney Walker MRN: [id number] DOB: [birth year] Today's Date: 01/28/2025  History of Present Illness  Pt is 69 yo presenting to [facility] on 1/28 due to drop in BP at dialysis. Pt is reporting cough, nausea, generalized weakness, generalized abdominal pain, burning sensation around her abdomen. Concern for infection at catheter insertion site for HD. PMH significant for HTN, HLD, PAD with a history of Lt fem-pop bypass in 2014, DVT and PE on Eliquis , chronic diastolic dysfunction, ESRD with HD on MWF, OSA, COPD, colon cancer.  Clinical Impression  Currently pt is Mod I for bed mobility, sit to stand and 40 ft of gait without an AD. Pt uses Rollator as needed. Currently pt has all of the equipment that she needs at home. Daughter is very supportive. Pt declines physical therapy in [facility] or OP at this time but is aware she can ask any MD at any point for physical therapy if needed. Currently pt is presenting at baseline level of functioning and no skilled physical therapy services recommended. Pt will be discharged from skilled physical therapy services at this time; please re-consult if further needs arise.            If plan is discharge home, recommend the following: Assistance with cooking/housework;Assist for transportation     Equipment Recommendations None recommended by PT     Functional Status Assessment Patient has had a recent decline in their functional status and demonstrates the ability to make significant improvements in function in a reasonable and predictable amount of time.     Precautions / Restrictions Precautions Precautions: None Recall of Precautions/Restrictions: Intact Restrictions Weight Bearing Restrictions Per Provider Order: No      Mobility  Bed Mobility Overal bed mobility: Modified Independent    Transfers Overall transfer level: Modified independent Equipment used: None       Ambulation/Gait Ambulation/Gait assistance: Modified independent (Device/Increase time) Gait Distance (Feet): 40 Feet Assistive device: None Gait Pattern/deviations: Step-through pattern, Decreased stride length Gait velocity: decreased Gait velocity interpretation: 1.31 - 2.62 ft/sec, indicative of limited community ambulator   General Gait Details: no significant deviations. Pt reports she uses rollator and gets short of breathe. Has about 20 ft to get into apt    Balance Overall balance assessment: No apparent balance deficits (not formally assessed), History of Falls       Pertinent Vitals/Pain Pain Assessment Pain Assessment: No/denies pain    Home Living Family/patient expects to be discharged to:: Private residence Living Arrangements: Alone Available Help at Discharge: Personal care attendant;Family;Available 24 hours/day (2.5 hours/day 5 days/week, daughter can assist.) Type of Home: Apartment Home Access: Stairs to enter Entrance Stairs-Rails: Right;Left Entrance Stairs-Number of Steps: 1   Home Layout: One level Home Equipment: Agricultural Consultant (2 wheels);Rollator (4 wheels);BSC/3in1;Shower seat;Wheelchair - manual;Grab bars - tub/shower      Prior Function Prior Level of Function : Independent/Modified Independent;Driving             Mobility Comments: sleeps in recliner, uses rollator for long distance or in home, uses scooter in store, has WC and uses for MD appointments. Pt reports 3 falls in 6 months. ADLs Comments: PCA helps with cleaning her back, bringing in groceries, cleans her bathroom and mops, helps with trash     Extremity/Trunk Assessment   Upper Extremity Assessment Upper Extremity Assessment: Overall WFL for tasks assessed;Defer to OT evaluation    Lower Extremity Assessment Lower Extremity Assessment: Overall WFL for tasks assessed  Cervical / Trunk Assessment Cervical / Trunk Assessment: Normal  Communication    Communication Communication: No apparent difficulties    Cognition Arousal: Alert Behavior During Therapy: WFL for tasks assessed/performed   PT - Cognitive impairments: No apparent impairments     Following commands: Intact       Cueing Cueing Techniques: Verbal cues     General Comments General comments (skin integrity, edema, etc.): daughter present and very supportive during session        Assessment/Plan    PT Assessment Patient does not need any further PT services         PT Goals (Current goals can be found in the Care Plan section)  Acute Rehab PT Goals PT Goal Formulation: All assessment and education complete, DC therapy     AM-PAC PT 6 Clicks Mobility  Outcome Measure Help needed turning from your back to your side while in a flat bed without using bedrails?: None Help needed moving from lying on your back to sitting on the side of a flat bed without using bedrails?: None Help needed moving to and from a bed to a chair (including a wheelchair)?: None Help needed standing up from a chair using your arms (e.g., wheelchair or bedside chair)?: None Help needed to walk in hospital room?: None Help needed climbing 3-5 steps with a railing? : A Little 6 Click Score: 23    End of Session   Activity Tolerance: Patient tolerated treatment well Patient left: in bed;with call bell/phone within reach;with family/visitor present Nurse Communication: Mobility status      Time: 8361-8295 PT Time Calculation (min) (ACUTE ONLY): 26 min   Charges:   PT Evaluation $PT Eval Low Complexity: 1 Low PT Treatments $Therapeutic Activity: 8-22 mins PT General Charges $$ ACUTE PT VISIT: 1 Visit        Dorothyann Maier, DPT, CLT  Acute Rehabilitation Services Office: [phone] (Secure chat preferred)   Dorothyann VEAR Maier 01/28/2025, 5:23 PM

## 2025-01-28 NOTE — Progress Notes (Signed)
 OT Cancellation Note  Patient Details Name: Whitney Walker MRN: [id number] DOB: 1956-07-13   Cancelled Treatment:    Reason Eval/Treat Not Completed: Patient at procedure or test/ unavailable.  Patient currently in IR.  OT will check back, and proceed as appropriate.    Charlisha Market D Darlene Bartelt 01/28/2025, 2:02 PM 01/28/2025  RP, OTR/L  Acute Rehabilitation Services  Office:  [phone]

## 2025-01-28 NOTE — Progress Notes (Signed)
 Per Dr. Jenna, procedure rescheduled for Monday to allow for eliquis  hold. Verbal order received from Brittany, NP to hold eliquis  starting now. Communicated update to bedside RN, Hailey. Pt verbalized understanding.

## 2025-01-28 NOTE — Progress Notes (Addendum)
 Brief follow up to consult note this AM from IR:  Patient was transported to IR after HD for IR MD to speak to patient and evaluate site. Dr. Jenna spoke to her in depth about the complexity of her case and her limited options given poor access with her declination of femoral access and additional fistula attempts.   Options discussed - converting L EJ line to temp line and then tunnel at a later date vs lateralize tunnel of existing access. Ultimately, Dr. Jenna does feel that the line can be tunneled more laterally, and patient prefers this option. However, Dr. Jenna recommends holding Eliquis  over the weekend to give this option a better chance of success given not ideal way to approach infection and would be further complicated by hematoma.   Team notified of the plan. Will hold Eliquis  over the weekend and add NPO for Monday.   Kym Scannell NP 01/28/2025 3:27 PM

## 2025-01-28 NOTE — Progress Notes (Signed)
 Pt. Came in awake and oriented with no complaints Consent signed and on file. Started with no complaints  UF removal: 0 ml Tx duration: 2.51 hours  Access used: Right CVC Access issue: Both arterial and venous are slightly sluggish but able to run at 334ml.hr BFR  During tx, pt. Complained of chest pain, Lucie Collet, PA at bedside. Hooked to 02 at 2LPM via nasal cannula.  Tx completed and tolerated. Ended tx early 9 mins. Due to tmp high Catheter locked and dwelled with heparin  Endorsed to floor nurse Transported to room.  Pranav Lince Rubi Arilyn Brierley, RN Kidney Care Unit

## 2025-01-29 DIAGNOSIS — I953 Hypotension of hemodialysis: Secondary | ICD-10-CM | POA: Diagnosis not present

## 2025-01-29 DIAGNOSIS — A419 Sepsis, unspecified organism: Secondary | ICD-10-CM | POA: Diagnosis not present

## 2025-01-29 DIAGNOSIS — I739 Peripheral vascular disease, unspecified: Secondary | ICD-10-CM | POA: Diagnosis not present

## 2025-01-29 DIAGNOSIS — I951 Orthostatic hypotension: Secondary | ICD-10-CM | POA: Diagnosis not present

## 2025-01-29 LAB — CBC
HCT: 40.1 % (ref 36.0–46.0)
Hemoglobin: 13.3 g/dL (ref 12.0–15.0)
MCH: 29.5 pg (ref 26.0–34.0)
MCHC: 33.2 g/dL (ref 30.0–36.0)
MCV: 88.9 fL (ref 80.0–100.0)
Platelets: 149 10*3/uL — ABNORMAL LOW (ref 150–400)
RBC: 4.51 MIL/uL (ref 3.87–5.11)
RDW: 15.4 % (ref 11.5–15.5)
WBC: 10.1 10*3/uL (ref 4.0–10.5)
nRBC: 0 % (ref 0.0–0.2)

## 2025-01-29 LAB — RENAL FUNCTION PANEL
Albumin: 3.4 g/dL — ABNORMAL LOW (ref 3.5–5.0)
Anion gap: 16 — ABNORMAL HIGH (ref 5–15)
BUN: 30 mg/dL — ABNORMAL HIGH (ref 8–23)
CO2: 22 mmol/L (ref 22–32)
Calcium: 8.8 mg/dL — ABNORMAL LOW (ref 8.9–10.3)
Chloride: 94 mmol/L — ABNORMAL LOW (ref 98–111)
Creatinine, Ser: 8.56 mg/dL — ABNORMAL HIGH (ref 0.44–1.00)
GFR, Estimated: 5 mL/min — ABNORMAL LOW
Glucose, Bld: 99 mg/dL (ref 70–99)
Phosphorus: 4.8 mg/dL — ABNORMAL HIGH (ref 2.5–4.6)
Potassium: 4.3 mmol/L (ref 3.5–5.1)
Sodium: 132 mmol/L — ABNORMAL LOW (ref 135–145)

## 2025-01-29 MED ORDER — SEVELAMER CARBONATE 800 MG PO TABS
2400.0000 mg | ORAL_TABLET | Freq: Three times a day (TID) | ORAL | Status: AC
  Administered 2025-01-29 – 2025-02-04 (×12): 2400 mg via ORAL
  Filled 2025-01-29 (×12): qty 3

## 2025-01-29 MED ORDER — ATORVASTATIN CALCIUM 10 MG PO TABS
20.0000 mg | ORAL_TABLET | ORAL | Status: AC
  Administered 2025-01-29 – 2025-02-04 (×7): 20 mg via ORAL
  Filled 2025-01-29 (×7): qty 2

## 2025-01-29 MED ORDER — SEVELAMER CARBONATE 800 MG PO TABS
800.0000 mg | ORAL_TABLET | Freq: Three times a day (TID) | ORAL | Status: DC
  Administered 2025-01-29: 800 mg via ORAL
  Filled 2025-01-29: qty 1

## 2025-01-29 MED ORDER — HEPARIN SODIUM (PORCINE) 5000 UNIT/ML IJ SOLN
5000.0000 [IU] | Freq: Three times a day (TID) | INTRAMUSCULAR | Status: AC
  Administered 2025-01-29 – 2025-02-04 (×17): 5000 [IU] via SUBCUTANEOUS
  Filled 2025-01-29 (×17): qty 1

## 2025-01-29 MED ORDER — DOXERCALCIFEROL 2.5 MCG PO CAPS
4.0000 ug | ORAL_CAPSULE | ORAL | Status: AC
  Administered 2025-02-02: 4 ug via ORAL
  Filled 2025-01-29 (×3): qty 3

## 2025-01-29 MED ORDER — EZETIMIBE 10 MG PO TABS
10.0000 mg | ORAL_TABLET | ORAL | Status: AC
  Administered 2025-01-29 – 2025-02-04 (×7): 10 mg via ORAL
  Filled 2025-01-29 (×7): qty 1

## 2025-01-29 NOTE — Progress Notes (Signed)
 OT Cancellation Note  Patient Details Name: Markesha Hannig MRN: [id number] DOB: 1956/10/21   Cancelled Treatment:    Reason Eval/Treat Not Completed: OT screened, no needs identified, will sign off. Per PT, pt ind with PCA to assist with ADLs. Pt reporting no OT related concerns at d/c, OT is signing off on this pt.   Lejend Dalby C, OT  Acute Rehabilitation Services Office [phone] Secure chat preferred   Adrianne GORMAN Savers 01/29/2025, 6:33 AM

## 2025-01-29 NOTE — Plan of Care (Signed)

## 2025-01-30 DIAGNOSIS — A419 Sepsis, unspecified organism: Secondary | ICD-10-CM | POA: Diagnosis not present

## 2025-01-30 DIAGNOSIS — I951 Orthostatic hypotension: Secondary | ICD-10-CM | POA: Diagnosis not present

## 2025-01-30 DIAGNOSIS — I739 Peripheral vascular disease, unspecified: Secondary | ICD-10-CM | POA: Diagnosis not present

## 2025-01-30 NOTE — Progress Notes (Signed)
 "                        PROGRESS NOTE        PATIENT DETAILS Name: Whitney Walker Age: 69 y.o. Sex: female Date of Birth: February 01, 1956 Admit Date: 01/26/2025 Admitting Physician Lenny Drought, MD ERE:Fnnmz, Gaines, FNP  Brief Summary: Patient is a 69 y.o.  female with history of ESRD on HD MWF, chronic hypotension on midodrine , VTE on Eliquis -presented to the ED on 1/28 from her dialysis center for hypotension-unknown responsive to IVF-started on pressors-concern for sepsis-subsequently admitted to ICU.  Stabilized and transferred to [facility] on 1/30.  Significant events: 1/28>> admit to [facility] 1/30>> transferred to [facility]  Significant studies: 1/28>> CTA chest: No PE-no PNA 1/28>> CT abdomen/pelvis: Stable postsurgical changes-subtotal colectomy with ileorectal anastomosis.  Significant microbiology data: 1/28>> COVID/influenza/RSV PCR: Negative 1/28>> blood culture: No growth  Procedures: None  Consults: PCCM Nephrology IR  Subjective: No major issues overnight-lying comfortably in bed.  Significantly less anxious today-claims she slept overnight well.  Objective: Vitals: Blood pressure (!) 148/108, pulse (!) 107, temperature (!) 97.5 F (36.4 C), temperature source Oral, resp. rate 17, height 5' 2 (1.575 m), weight 92.1 kg, SpO2 99%.   Exam: Gen Exam:Alert awake-not in any distress HEENT:atraumatic, normocephalic Chest: B/L clear to auscultation anteriorly CVS:S1S2 regular Abdomen:soft non tender, non distended Extremities:no edema Neurology: Non focal Skin: no rash  Pertinent Labs/Radiology:    Latest Ref Rng & Units 01/29/2025    4:56 AM 01/27/2025    2:08 AM 01/26/2025    9:41 AM  CBC  WBC 4.0 - 10.5 K/uL 10.1  11.2    Hemoglobin 12.0 - 15.0 g/dL 86.6  85.2  83.2   Hematocrit 36.0 - 46.0 % 40.1  48.5  49.0   Platelets 150 - 400 K/uL 149  150      Lab Results  Component Value Date   NA 132 (L) 01/29/2025   K 4.3 01/29/2025   CL 94 (L) 01/29/2025    CO2 22 01/29/2025      Assessment/Plan: Septic shock-2/2 ?  Tunneled catheter site/pocket infection Sepsis physiology has resolved-briefly required Levophed  while in the ICU Blood cultures are negative so far Did have some purulence from catheter site on 1/30-do not see any purulence today After discussion with nephrologist-IR-plan is for lateralization of existing TDC catheter on Monday-Eliquis  on hold. Continue vancomycin .  ESRD on HD MWF Defer dialysis to nephrology service  Chronic hypotension Midodrine -BP has stabilized.  Hyponatremia Likely secondary to free water excretion in the setting of ESRD Improved with further HD.  History of VTE Eliquis  on hold for IR procedure on Monday-on prophylactic heparin  currently  History of PAD Stable-chronic issue Outpatient follow-up with vascular surgery  HLD Statin/Zetia   COPD Stable Bronchodilators  OSA Intolerant to CPAP prior notes  6 mm nodule RLL/3 mm left upper lobe nodule Repeat CT chest in 3 to 6 months.  Class 2 Obesity: Estimated body mass index is 37.14 kg/m as calculated from the following:   Height as of this encounter: 5' 2 (1.575 m).   Weight as of this encounter: 92.1 kg.   Code status:   Code Status: Full Code   DVT Prophylaxis: heparin  injection 5,000 Units Start: 01/29/25 0745 SCDs Start: 01/26/25 1242     Family Communication: None at bedside   Disposition Plan: Status is: Inpatient Remains inpatient appropriate because: Severity of illness   Planned Discharge Destination:Home   Diet: Diet Order  Diet NPO time specified Except for: Sips with Meds, Ice Chips  Diet effective now           Diet regular Room service appropriate? Yes; Fluid consistency: Thin; Fluid restriction: 1200 mL Fluid  Diet effective now                     Antimicrobial agents: Anti-infectives (From admission, onward)    Start     Dose/Rate Route Frequency Ordered Stop   01/28/25 1200   vancomycin  (VANCOCIN ) IVPB 1000 mg/200 mL premix        1,000 mg 200 mL/hr over 60 Minutes Intravenous Every M-W-F (Hemodialysis) 01/26/25 1353     01/27/25 1700  vancomycin  (VANCOCIN ) IVPB 1000 mg/200 mL premix  Status:  Discontinued        1,000 mg 200 mL/hr over 60 Minutes Intravenous  Once 01/27/25 1603 01/28/25 0756   01/26/25 1700  ceFEPIme  (MAXIPIME ) 1 g in sodium chloride  0.9 % 100 mL IVPB  Status:  Discontinued        1 g 200 mL/hr over 30 Minutes Intravenous Every 24 hours 01/26/25 1353 01/28/25 1057   01/26/25 0915  piperacillin -tazobactam (ZOSYN ) IVPB 3.375 g        3.375 g 100 mL/hr over 30 Minutes Intravenous  Once 01/26/25 0912 01/26/25 1010   01/26/25 0915  vancomycin  (VANCOREADY) IVPB 2000 mg/400 mL        2,000 mg 200 mL/hr over 120 Minutes Intravenous  Once 01/26/25 0912 01/26/25 1238        MEDICATIONS: Scheduled Meds:  vitamin C   250 mg Oral Daily   atorvastatin   20 mg Oral Q24H   Chlorhexidine  Gluconate Cloth  6 each Topical Q0600   [START ON 01/31/2025] doxercalciferol   4 mcg Oral Once per day on Monday Wednesday Friday   ezetimibe   10 mg Oral Q24H   heparin  injection (subcutaneous)  5,000 Units Subcutaneous Q8H   midodrine   5 mg Oral TID WC   nicotine   21 mg Transdermal Q24H   pantoprazole   40 mg Oral Daily   sevelamer  carbonate  2,400 mg Oral TID WC   vitamin E   200 Units Oral Daily   Continuous Infusions:  vancomycin  Stopped (01/28/25 1245)   PRN Meds:.acetaminophen , albuterol , meclizine , mouth rinse, traZODone    I have personally reviewed following labs and imaging studies  LABORATORY DATA: CBC: Recent Labs  Lab 01/26/25 0931 01/26/25 0941 01/27/25 0208 01/29/25 0456  WBC 11.6*  --  11.2* 10.1  NEUTROABS 8.7*  --   --   --   HGB 14.7 16.7* 14.7 13.3  HCT 44.8 49.0* 48.5* 40.1  MCV 90.0  --  93.0 88.9  PLT 150  --  150 149*    Basic Metabolic Panel: Recent Labs  Lab 01/26/25 0941 01/26/25 1436 01/27/25 0208 01/27/25 0614  01/29/25 0456  NA 133* 130* 128* 129* 132*  K 4.8 5.1 6.1* 5.4* 4.3  CL 103 92* 95* 95* 94*  CO2  --  14* 13* 15* 22  GLUCOSE 92 86 123* 124* 99  BUN 41* 39* 46* 48* 30*  CREATININE 9.50* 9.50* 11.40* 11.40* 8.56*  CALCIUM   --  8.7* 8.1* 8.4* 8.8*  MG  --  2.2 2.2  --   --   PHOS  --   --  6.5*  --  4.8*    GFR: Estimated Creatinine Clearance: 6.6 mL/min (A) (by C-G formula based on SCr of 8.56 mg/dL (H)).  Liver Function Tests: Recent  Labs  Lab 01/26/25 1436 01/29/25 0456  AST 32  --   ALT 26  --   ALKPHOS 130*  --   BILITOT 1.0  --   PROT 7.8  --   ALBUMIN  3.7 3.4*   Recent Labs  Lab 01/26/25 1436  LIPASE 40   No results for input(s): AMMONIA in the last 168 hours.  Coagulation Profile: No results for input(s): INR, PROTIME in the last 168 hours.  Cardiac Enzymes: No results for input(s): CKTOTAL, CKMB, CKMBINDEX, TROPONINI in the last 168 hours.  BNP (last 3 results) No results for input(s): PROBNP in the last 8760 hours.  Lipid Profile: No results for input(s): CHOL, HDL, LDLCALC, TRIG, CHOLHDL, LDLDIRECT in the last 72 hours.  Thyroid  Function Tests: No results for input(s): TSH, T4TOTAL, FREET4, T3FREE, THYROIDAB in the last 72 hours.  Anemia Panel: No results for input(s): VITAMINB12, FOLATE, FERRITIN, TIBC, IRON , RETICCTPCT in the last 72 hours.  Urine analysis:    Component Value Date/Time   COLORURINE YELLOW 03/02/2019 1416   APPEARANCEUR CLEAR 03/02/2019 1416   LABSPEC 1.010 03/02/2019 1416   PHURINE 6.0 03/02/2019 1416   GLUCOSEU 50 (A) 03/02/2019 1416   HGBUR SMALL (A) 03/02/2019 1416   BILIRUBINUR NEGATIVE 03/02/2019 1416   KETONESUR 5 (A) 03/02/2019 1416   PROTEINUR 100 (A) 03/02/2019 1416   UROBILINOGEN 0.2 10/19/2015 1532   NITRITE NEGATIVE 03/02/2019 1416   LEUKOCYTESUR NEGATIVE 03/02/2019 1416    Sepsis Labs: Lactic Acid, Venous    Component Value Date/Time   LATICACIDVEN  1.8 01/26/2025 1437    MICROBIOLOGY: Recent Results (from the past 240 hours)  Blood culture (routine x 2)     Status: None (Preliminary result)   Collection Time: 01/26/25  9:08 AM   Specimen: BLOOD  Result Value Ref Range Status   Specimen Description BLOOD SITE NOT SPECIFIED  Final   Special Requests   Final    BOTTLES DRAWN AEROBIC AND ANAEROBIC Blood Culture results may not be optimal due to an inadequate volume of blood received in culture bottles   Culture   Final    NO GROWTH 4 DAYS Performed at [facility], [address] [place], [place] [address]    Report Status PENDING  Incomplete  Resp panel by RT-PCR (RSV, Flu A&B, Covid) Anterior Nasal Swab     Status: None   Collection Time: 01/26/25  9:08 AM   Specimen: Anterior Nasal Swab  Result Value Ref Range Status   SARS Coronavirus 2 by RT PCR NEGATIVE NEGATIVE Final   Influenza A by PCR NEGATIVE NEGATIVE Final   Influenza B by PCR NEGATIVE NEGATIVE Final    Comment: (NOTE) The Xpert Xpress SARS-CoV-2/FLU/RSV plus assay is intended as an aid in the diagnosis of influenza from Nasopharyngeal swab specimens and should not be used as a sole basis for treatment. Nasal washings and aspirates are unacceptable for Xpert Xpress SARS-CoV-2/FLU/RSV testing.  Fact Sheet for Patients: bloggercourse.com  Fact Sheet for Healthcare Providers: seriousbroker.it  This test is not yet approved or cleared by the [place]  FDA and has been authorized for detection and/or diagnosis of SARS-CoV-2 by FDA under an Emergency Use Authorization (EUA). This EUA will remain in effect (meaning this test can be used) for the duration of the COVID-19 declaration under Section 564(b)(1) of the Act, 21 U.S.C. section 360bbb-3(b)(1), unless the authorization is terminated or revoked.     Resp Syncytial Virus by PCR NEGATIVE NEGATIVE Final    Comment: (  NOTE) Fact Sheet for  Patients: bloggercourse.com  Fact Sheet for Healthcare Providers: seriousbroker.it  This test is not yet approved or cleared by the [place]  FDA and has been authorized for detection and/or diagnosis of SARS-CoV-2 by FDA under an Emergency Use Authorization (EUA). This EUA will remain in effect (meaning this test can be used) for the duration of the COVID-19 declaration under Section 564(b)(1) of the Act, 21 U.S.C. section 360bbb-3(b)(1), unless the authorization is terminated or revoked.  Performed at [facility], [address] [place], [place] [address]   Blood culture (routine x 2)     Status: None (Preliminary result)   Collection Time: 01/26/25  9:13 AM   Specimen: Site Not Specified; Blood  Result Value Ref Range Status   Specimen Description SITE NOT SPECIFIED  Final   Special Requests   Final    BOTTLES DRAWN AEROBIC AND ANAEROBIC Blood Culture adequate volume   Culture   Final    NO GROWTH 4 DAYS Performed at [facility], [address] [place], [place] [address]    Report Status PENDING  Incomplete  MRSA Next Gen by PCR, Nasal     Status: None   Collection Time: 01/26/25 12:42 PM   Specimen: Nasal Mucosa; Nasal Swab  Result Value Ref Range Status   MRSA by PCR Next Gen NOT DETECTED NOT DETECTED Final    Comment: (NOTE) The GeneXpert MRSA Assay (FDA approved for NASAL specimens only), is one component of a comprehensive MRSA colonization surveillance program. It is not intended to diagnose MRSA infection nor to guide or monitor treatment for MRSA infections. Test performance is not FDA approved in patients less than 62 years old. Performed at [facility], [address], [place], [place] [address]     RADIOLOGY STUDIES/RESULTS: No results found.    LOS: 4 days   Donalda Applebaum, MD  [facility]    To contact the attending provider between 7A-7P or the covering provider  during after hours 7P-7A, please log into the web site [email or web address] and access using universal [place] password for that web site. If you do not have the password, please call the hospital operator.  01/30/2025, 9:04 AM    "

## 2025-01-30 NOTE — Progress Notes (Signed)
 Leiani Enright is an 69 y.o. female ESRD on HD MWF at [facility].  Past medical history significant for HTN, chronic diastolic HF, COPD, CAD, PAD s/p left fem pop 2014, Hx colon cancer in remission, OSA not on CPAP, OA, anxiety/depression, Hx DVT + PE on AC and history of hyperkalemia (on Lokelma  at home).  Patient has had to shorten treatments the past 2 treatments including today receiving only approximately 2 hours out of her 3-1/2-hour planned and prescribed treatment because of hypotension.  This does occur occasionally but response to fluid but unfortunately at this time she received 500 milliliters of isotonic fluid with no improvement.  Patient was found diastolic pressure of 60 transported to the emergency department.  Patient has had a cough, nausea, weakness since Saturday, missed dialysis on Monday and generalized abdominal pain, chronic diarrhea but denies any dark black tarry stool or hematochezia.  Patient does have some shortness of breath which is chronic and not any worse than usual.  Patient was given another fluid bolus in the emergency room and started on Levophed  .  CT of the chest abdomen pelvis was performed showing a solid 6 mm nodule inferiorly in the right lower lobe as well as a new left upper lobe nodule.  Changes with prior subtotal colectomy with no distention or wall thickening/inflammation.  Patient with noted discomfort around the catheter insertion site for a few days.  [facility] MWF 1st shift 3hr30 2/2 180NR EDW 90kg (has been reaching and sometimes below) Mirc 0, Hect4  Assessment/Plan: ESRD -  on MWF.  Tolerated HD Fri with no UF; she was at her EDW and didn't appear to be overloaded. Continued lengthy discussion again about permanent access which she continues to refuse; she will live and die with a catheter. She's had bad experiences with infiltrations previously.  There is a area of irritation/ nodule over the tunnel tract; may not be grossly bacteremic but  don't want her to end up losing the access site ([facility]) as per VIR unable to access to left side. Patient refusing a fem catheter and also permanent access. D/W VIR PA and plan on lateralization of the tunnel with new exit site on Mon 1PM.   Will make sure she is 1st shift on Mon.  Additional recommendations - Dose all meds for creatinine clearance < 10 ml/min  - Unless absolutely necessary, no MRIs with gadolinium.  - Prefer needle sticks in the dorsum of the hands or wrists.  No blood pressure measurements in right arm. - If blood transfusion is requested during hemodialysis sessions, please alert us  prior to the session.  - If a hemodialysis catheter line culture is requested, please alert us  as only hemodialysis nurses are able to collect those specimens.   Avoid nephrotoxic medications including NSAIDs and iodinated intravenous contrast exposure unless the latter is absolutely indicated.  Preferred narcotic agents for pain control are hydromorphone , fentanyl , and methadone. Morphine  should not be used. Avoid Baclofen and avoid oral sodium phosphate  and magnesium  citrate based laxatives / bowel preps. Continue strict Input and Output monitoring. Will monitor the patient closely with you and intervene or adjust therapy as indicated by changes in clinical status/labs    Hypotension/volume  - BP in goal. Does not appear volume overloaded.  Under dry weight as mentioned above. Suspicious for infection; there was a small amount of pus expressed from the exit site.  Anemia of CKD - Hgb 13.3. No indication for ESA/iron  therapy with active infection; last ESA was  given 09/13/24.   Secondary Hyperparathyroidism -  Ca in goal. Phos 4.8. Continue VDRA and binders.   Nutrition - Renal diet w/fluid restrictions when advanced.  H/o COPD PAD s/p bypass Prior DVT/PE on Eliquis   Subjective: Chronic cough but denies fever, chills, nausea; still refusing perm access and also fem catheter, off Levophed .    Chemistry and CBC: Creatinine  Date/Time Value Ref Range Status  06/22/2024 12:22 PM 8.16 (HH) 0.44 - 1.00 mg/dL Final    Comment:    REPEATED TO VERIFY CRITICAL RESULT CALLED TO, READ BACK BY AND VERIFIED WITH: Anders Seena PEAK at 1332 by Aldona [name] (MT) 06/22/2024    05/22/2023 11:27 AM 7.43 (HH) 0.44 - 1.00 mg/dL Final    Comment:    REPEATED TO VERIFY CRITICAL RESULT CALLED TO, READ BACK BY AND VERIFIED WITH: Anders Seena PEAK at 1227 pm by Aldona [name] (MT) 05/22/2023    04/21/2019 12:00 AM 10.7 (A) 0.5 - 1.1 Final  07/07/2017 12:35 PM 3.4 (HH) 0.6 - 1.1 mg/dL Final  98/94/7981 90:70 AM 3.4 Repeated and Verified (HH) 0.6 - 1.1 mg/dL Final  91/92/7982 87:64 PM 2.6 (H) 0.6 - 1.1 mg/dL Final  98/86/7983 96:79 PM 1.9 (H) 0.6 - 1.1 mg/dL Final  89/68/7985 87:58 PM 1.6 (H) 0.6 - 1.1 mg/dL Final  87/93/7986 89:43 AM 1.7 (H) 0.6 - 1.1 mg/dL Final   Creatinine, Ser  Date/Time Value Ref Range Status  01/29/2025 04:56 AM 8.56 (H) 0.44 - 1.00 mg/dL Final  98/70/7973 93:85 AM 11.40 (H) 0.44 - 1.00 mg/dL Final  98/70/7973 97:91 AM 11.40 (H) 0.44 - 1.00 mg/dL Final  98/71/7973 97:63 PM 9.50 (H) 0.44 - 1.00 mg/dL Final  98/71/7973 90:58 AM 9.50 (H) 0.44 - 1.00 mg/dL Final  94/77/7974 93:83 AM 7.11 (H) 0.44 - 1.00 mg/dL Final    Comment:    DELTA CHECK NOTED  05/19/2024 10:19 AM 4.59 (H) 0.44 - 1.00 mg/dL Final  94/85/7974 93:53 AM 10.79 (H) 0.44 - 1.00 mg/dL Final  94/88/7974 92:79 AM 8.84 (H) 0.44 - 1.00 mg/dL Final  98/69/7974 97:42 PM 7.35 (H) 0.44 - 1.00 mg/dL Final  87/96/7975 93:91 AM 7.27 (H) 0.44 - 1.00 mg/dL Final  87/97/7975 90:69 PM 13.13 (H) 0.44 - 1.00 mg/dL Final  87/97/7975 98:94 PM 13.38 (H) 0.44 - 1.00 mg/dL Final  95/90/7975 93:56 PM 9.81 (H) 0.44 - 1.00 mg/dL Final  87/87/7976 87:99 PM 9.10 (H) 0.44 - 1.00 mg/dL Final  93/98/7976 95:95 PM 7.91 (H) 0.57 - 1.00 mg/dL Final  94/77/7976 98:68 AM 9.12 (H) 0.44 - 1.00 mg/dL Final  94/78/7976 91:92 PM 9.05 (H) 0.44 -  1.00 mg/dL Final    Comment:    DELTA CHECK NOTED  05/18/2022 04:33 AM 5.49 (H) 0.44 - 1.00 mg/dL Final    Comment:    DELTA CHECK NOTED  05/17/2022 12:47 AM 9.04 (H) 0.44 - 1.00 mg/dL Final  94/81/7976 97:92 PM 8.21 (H) 0.44 - 1.00 mg/dL Final  94/94/7976 98:81 AM 10.62 (H) 0.44 - 1.00 mg/dL Final  94/96/7976 89:80 PM 7.90 (H) 0.44 - 1.00 mg/dL Final  94/96/7976 98:42 PM 15.55 (H) 0.44 - 1.00 mg/dL Final  94/96/7976 91:89 AM 15.23 (H) 0.44 - 1.00 mg/dL Final  88/82/7977 88:54 AM 6.96 (H) 0.57 - 1.00 mg/dL Final  92/75/7977 87:53 AM 6.33 (H) 0.44 - 1.00 mg/dL Final  92/76/7977 98:79 AM 9.47 (H) 0.44 - 1.00 mg/dL Final  92/77/7977 98:50 AM 8.09 (H) 0.44 - 1.00 mg/dL Final  92/79/7977 88:54 PM 5.80 (H) 0.44 -  1.00 mg/dL Final    Comment:    DELTA CHECK NOTED  07/18/2021 02:30 PM 8.91 (H) 0.44 - 1.00 mg/dL Final  92/80/7977 95:91 AM 7.20 (H) 0.44 - 1.00 mg/dL Final  92/81/7977 97:97 PM 7.31 (H) 0.44 - 1.00 mg/dL Final  93/97/7977 97:72 AM 5.98 (H) 0.44 - 1.00 mg/dL Final    Comment:    DELTA CHECK NOTED  05/30/2021 01:19 AM 9.84 (H) 0.44 - 1.00 mg/dL Final  94/68/7977 87:61 AM 7.54 (H) 0.44 - 1.00 mg/dL Final    Comment:    DELTA CHECK NOTED DIALYSIS   05/28/2021 08:42 AM 12.78 (H) 0.44 - 1.00 mg/dL Final  94/80/7977 87:80 PM 8.60 (HH) 0.44 - 1.00 mg/dL Final    Comment:    CRITICAL RESULT CALLED TO, READ BACK BY AND VERIFIED WITH: karen beach at 1308 by hflynt 05/17/21   04/25/2021 11:28 AM 4.34 (H) 0.57 - 1.00 mg/dL Final    Comment:    **Verified by repeat analysis**  03/15/2021 11:43 AM 6.27 (H) 0.57 - 1.00 mg/dL Final  97/92/7977 96:58 PM 4.81 (H) 0.44 - 1.00 mg/dL Final  88/96/7978 87:64 AM 10.69 (H) 0.44 - 1.00 mg/dL Final  88/97/7978 87:72 AM 8.04 (H) 0.44 - 1.00 mg/dL Final    Comment:    DELTA CHECK NOTED  10/30/2020 04:07 AM 12.21 (H) 0.44 - 1.00 mg/dL Final  94/79/7978 89:63 AM 6.98 (HH) 0.44 - 1.00 mg/dL Final    Comment:    CRITICAL RESULT CALLED TO, READ  BACK BY AND VERIFIED WITH: karen beach by hflynt 05/18/20   05/10/2020 06:31 AM 8.44 (H) 0.44 - 1.00 mg/dL Final  94/88/7978 87:53 PM 5.80 (H) 0.44 - 1.00 mg/dL Final    Comment:    DELTA CHECK NOTED DIALYSIS   05/09/2020 07:08 AM 13.59 (H) 0.44 - 1.00 mg/dL Final  94/88/7978 87:94 AM 13.20 (H) 0.44 - 1.00 mg/dL Final  88/80/7979 87:60 PM 8.25 (HH) 0.44 - 1.00 mg/dL Final    Comment:    CRITICAL RESULT CALLED TO, READ BACK BY AND VERIFIED WITH: Mazie Boyer, RN at 1334 by CHRISTELLA Rattler   03/03/2019 05:14 AM 6.40 (H) 0.44 - 1.00 mg/dL Final    Comment:    DELTA CHECK NOTED  03/02/2019 04:35 PM 15.49 (H) 0.44 - 1.00 mg/dL Final   Recent Labs  Lab 01/26/25 0941 01/26/25 1436 01/27/25 0208 01/27/25 0614 01/29/25 0456  NA 133* 130* 128* 129* 132*  K 4.8 5.1 6.1* 5.4* 4.3  CL 103 92* 95* 95* 94*  CO2  --  14* 13* 15* 22  GLUCOSE 92 86 123* 124* 99  BUN 41* 39* 46* 48* 30*  CREATININE 9.50* 9.50* 11.40* 11.40* 8.56*  CALCIUM   --  8.7* 8.1* 8.4* 8.8*  PHOS  --   --  6.5*  --  4.8*   Recent Labs  Lab 01/26/25 0931 01/26/25 0941 01/27/25 0208 01/29/25 0456  WBC 11.6*  --  11.2* 10.1  NEUTROABS 8.7*  --   --   --   HGB 14.7 16.7* 14.7 13.3  HCT 44.8 49.0* 48.5* 40.1  MCV 90.0  --  93.0 88.9  PLT 150  --  150 149*   Liver Function Tests: Recent Labs  Lab 01/26/25 1436 01/29/25 0456  AST 32  --   ALT 26  --   ALKPHOS 130*  --   BILITOT 1.0  --   PROT 7.8  --   ALBUMIN  3.7 3.4*   Recent Labs  Lab 01/26/25  1436  LIPASE 40   No results for input(s): AMMONIA in the last 168 hours. Cardiac Enzymes: No results for input(s): CKTOTAL, CKMB, CKMBINDEX, TROPONINI in the last 168 hours. Iron  Studies: No results for input(s): IRON , TIBC, TRANSFERRIN, FERRITIN in the last 72 hours. PT/INR: @LABRCNTIP (inr:5)  Xrays/Other Studies: ) Results for orders placed or performed during the hospital encounter of 01/26/25 (from the past 48 hours)  CBC     Status:  Abnormal   Collection Time: 01/29/25  4:56 AM  Result Value Ref Range   WBC 10.1 4.0 - 10.5 K/uL   RBC 4.51 3.87 - 5.11 MIL/uL   Hemoglobin 13.3 12.0 - 15.0 g/dL   HCT 59.8 63.9 - 53.9 %   MCV 88.9 80.0 - 100.0 fL   MCH 29.5 26.0 - 34.0 pg   MCHC 33.2 30.0 - 36.0 g/dL   RDW 84.5 88.4 - 84.4 %   Platelets 149 (L) 150 - 400 K/uL   nRBC 0.0 0.0 - 0.2 %    Comment: Performed at [facility], [address], [place], [place] [address]  Renal function panel     Status: Abnormal   Collection Time: 01/29/25  4:56 AM  Result Value Ref Range   Sodium 132 (L) 135 - 145 mmol/L   Potassium 4.3 3.5 - 5.1 mmol/L    Comment: RESULTS CHECKED   Chloride 94 (L) 98 - 111 mmol/L   CO2 22 22 - 32 mmol/L   Glucose, Bld 99 70 - 99 mg/dL    Comment: Glucose reference range applies only to samples taken after fasting for at least 8 hours.   BUN 30 (H) 8 - 23 mg/dL   Creatinine, Ser 1.43 (H) 0.44 - 1.00 mg/dL   Calcium  8.8 (L) 8.9 - 10.3 mg/dL   Phosphorus 4.8 (H) 2.5 - 4.6 mg/dL   Albumin  3.4 (L) 3.5 - 5.0 g/dL   GFR, Estimated 5 (L) >60 mL/min    Comment: (NOTE) Calculated using the CKD-EPI Creatinine Equation (2021)    Anion gap 16 (H) 5 - 15    Comment: Performed at [facility], [address], [place], [place] [address]   *Note: Due to a large number of results and/or encounters for the requested time period, some results have not been displayed. A complete set of results can be found in Results Review.   No results found.   PMH:   Past Medical History:  Diagnosis Date   Acute left-sided low back pain without sciatica 02/22/2024   Anemia    Anxiety    CHF (congestive heart failure) (HCC)    Colon cancer (HCC)    treatment surgery   Complication of anesthesia    after first [place]- [place] couldnt walk after, patient denies having a spinal   COPD (chronic obstructive pulmonary disease) (HCC)    Coronary artery disease    Depression    DVT (deep venous thrombosis) (HCC)     Encounter for annual health examination 08/05/2023   ESRD on hemodialysis (HCC)    Hemo: MWF   History of blood transfusion 04/2018   Hypertension    07/07/18- no longer takes BP medications   Left hip pain 05/16/2022   Left leg pain 05/09/2020   Leucocytosis 04/28/2012   Meningitis    Orthostatic hypotension 05/16/2022   Pain in limb 07/30/2013   PE (pulmonary embolism)    Peripheral vascular disease    Primary hypertension 11/15/2013   Rash 11/25/2023   Restless  legs    Shortness of breath    with exertion   Sleep apnea    SOB (shortness of breath) 03/03/2019   Vertigo     PSH:   Past Surgical History:  Procedure Laterality Date   ABDOMINAL AORTAGRAM N/A 07/26/2013   Procedure: ABDOMINAL AORTAGRAM;  Surgeon: Redell LITTIE Door, MD;  Location: [facility] CATH LAB;  Service: Cardiovascular;  Laterality: N/A;   ABDOMINAL HYSTERECTOMY     AV FISTULA PLACEMENT Left 05/05/2018   Procedure: ARTERIOVENOUS (AV) FISTULA CREATION BRACHIOCEPHALIC;  Surgeon: Sheree Penne Bruckner, MD;  Location: [facility] OR;  Service: Vascular;  Laterality: Left;   AV FISTULA PLACEMENT Left 07/16/2018   Procedure: ARTERIOVENOUS FISTULA CREATION;  Surgeon: Sheree Penne Bruckner, MD;  Location: [facility] OR;  Service: Vascular;  Laterality: Left;   BASCILIC VEIN TRANSPOSITION Left 09/03/2018   Procedure: BASILIC VEIN TRANSPOSITION SECOND STAGE;  Surgeon: Sheree Penne Bruckner, MD;  Location: [facility] OR;  Service: Vascular;  Laterality: Left;   BREAST BIOPSY Left    CESAREAN SECTION     X 3 1974-1977   CHOLECYSTECTOMY     CHOLECYSTECTOMY  1980/s   COLECTOMY  2010   DIVERTICULOSIS SURGERY-2002  2012   FEMORAL-POPLITEAL BYPASS GRAFT Left 08/13/2013   Procedure: BYPASS GRAFT FEMORAL-POPLITEAL ARTERY WITH NON-REVERSED SAPHANEOUS VEIN; ULTRASOUND GUIDED;  Surgeon: Lynwood JONETTA Collum, MD;  Location: [facility] OR;  Service: Vascular;  Laterality: Left;   FLEXIBLE SIGMOIDOSCOPY N/A 12/10/2022   Procedure: FLEXIBLE SIGMOIDOSCOPY;  Surgeon: Rollin Dover, MD;  Location: [facility];  Service: Gastroenterology;  Laterality: N/A;   INSERTION OF DIALYSIS CATHETER Right 04/30/2018   Procedure: INSERTION OF Right Internal Jugular DIALYSIS CATHETER;  Surgeon: Serene Gaile ORN, MD;  Location: [facility] OR;  Service: Vascular;  Laterality: Right;   IR FLUORO GUIDE CV LINE LEFT  05/01/2022   IR FLUORO GUIDE CV LINE LEFT  05/21/2022   IR FLUORO GUIDE CV LINE RIGHT  07/24/2020   IR FLUORO GUIDE CV LINE RIGHT  07/21/2021   IR FLUORO GUIDE CV LINE RIGHT  05/20/2022   IR PTA VENOUS EXCEPT DIALYSIS CIRCUIT  05/21/2022   IR REMOVAL TUN CV CATH W/O FL  07/21/2020   IR REMOVAL TUN CV CATH W/O FL  07/19/2021   IR REMOVAL TUN CV CATH W/O FL  05/17/2022   IR US  GUIDE VASC ACCESS LEFT  05/21/2022   IR US  GUIDE VASC ACCESS RIGHT  07/24/2020   IR US  GUIDE VASC ACCESS RIGHT  07/21/2021   IR US  GUIDE VASC ACCESS RIGHT  05/20/2022   IR VENOCAVAGRAM SVC  05/21/2022   LEFT HEART CATH AND CORONARY ANGIOGRAPHY N/A 05/01/2021   Procedure: LEFT HEART CATH AND CORONARY ANGIOGRAPHY;  Surgeon: Ladona Heinz, MD;  Location: [facility] INVASIVE CV LAB;  Service: Cardiovascular;  Laterality: N/A;   LEFT HEART CATH AND CORONARY ANGIOGRAPHY N/A 05/02/2022   Procedure: LEFT HEART CATH AND CORONARY ANGIOGRAPHY;  Surgeon: Ladona Heinz, MD;  Location: [facility] INVASIVE CV LAB;  Service: Cardiovascular;  Laterality: N/A;   LOWER EXTREMITY ANGIOGRAM Left 07/26/2013   Procedure: LOWER EXTREMITY ANGIOGRAM;  Surgeon: Redell LITTIE Door, MD;  Location: [facility] CATH LAB;  Service: Cardiovascular;  Laterality: Left;    Allergies: Allergies[1]  Medications:   Prior to Admission medications  Medication Sig Start Date End Date Taking? Authorizing Provider  acetaminophen  (TYLENOL ) 650 MG CR tablet Take 1,950 mg by mouth every 8 (eight) hours as needed for pain.   Yes [provider]  albuterol  (VENTOLIN  HFA) 108 (90 Base) MCG/ACT  inhaler Inhale 2 puffs into the lungs every 6 (six) hours as needed for wheezing or shortness of  breath. 12/17/24  Yes Georgina Speaks, FNP  atorvastatin  (LIPITOR) 20 MG tablet Take 1 tablet (20 mg total) by mouth daily. 12/17/24 01/26/25 Yes Georgina Speaks, FNP  Biotin  5 MG CAPS Take 10 mg by mouth daily at 6 (six) AM.   Yes [provider]  ELIQUIS  5 MG TABS tablet TAKE 1 TABLET (5 MG TOTAL) BY MOUTH 2 (TWO) TIMES DAILY. 11/22/24  Yes Georgina Speaks, FNP  ezetimibe  (ZETIA ) 10 MG tablet Take 1 tablet (10 mg total) by mouth daily. 12/02/24 03/02/25 Yes Ladona Heinz, MD  nicotine  (NICODERM CQ  - DOSED IN MG/24 HOURS) 21 mg/24hr patch Place 1 patch (21 mg total) onto the skin daily. 01/04/25 01/04/26 Yes Georgina Speaks, FNP  omeprazole  (PRILOSEC) 20 MG capsule TAKE 1 CAPSULE (20 MG TOTAL) BY MOUTH DAILY. 11/22/24  Yes Georgina Speaks, FNP  sevelamer  carbonate (RENVELA ) 800 MG tablet Take 800 mg by mouth at bedtime. 10/27/23  Yes [provider]  sodium zirconium cyclosilicate  (LOKELMA ) 10 g PACK packet Take 10 g by mouth daily.   Yes [provider]  vitamin E  200 UNIT capsule Take 200 Units by mouth daily.   Yes [provider]  meclizine  (ANTIVERT ) 12.5 MG tablet Take 1 tablet (12.5 mg total) by mouth 3 (three) times daily as needed for dizziness. 09/18/23   Georgina Speaks, FNP  midodrine  (PROAMATINE ) 5 MG tablet Take 1 tablet (5 mg total) by mouth 2 (two) times daily as needed (hypotension). Take one tab before dialysis Patient not taking: Reported on 01/26/2025 12/02/24   Ladona Heinz, MD  multivitamin (RENA-VIT) TABS tablet Take 1 tablet by mouth at bedtime. Patient not taking: Reported on 01/26/2025    [provider]  Tenapanor  HCl, CKD, 30 MG TABS Take 1 tablet by mouth daily. Patient not taking: Reported on 01/26/2025    [provider]  Tiotropium Bromide-Olodaterol (STIOLTO RESPIMAT ) 2.5-2.5 MCG/ACT AERS Inhale 2 puffs into the lungs daily. Patient not taking: Reported on 01/26/2025 05/04/24   Hunsucker, Donnice SAUNDERS, MD  vitamin C  (ASCORBIC ACID ) 250 MG tablet  Take 250 mg by mouth daily. Patient not taking: Reported on 01/26/2025    [provider]    Discontinued Meds:   Medications Discontinued During This Encounter  Medication Reason   norepinephrine  (LEVOPHED ) 4mg  in (0.016 mg/mL) premix infusion    Tenapanor  HCl (CKD) TABS 1 tablet Patient has not taken in last 30 days   Chlorhexidine  Gluconate Cloth 2 % PADS 6 each Duplicate   vancomycin  (VANCOCIN ) IVPB 1000 mg/200 mL premix    norepinephrine  (LEVOPHED ) 4mg  in (0.016 mg/mL) premix infusion Completed Course   ceFEPIme  (MAXIPIME ) 1 g in sodium chloride  0.9 % 100 mL IVPB    pentafluoroprop-tetrafluoroeth (GEBAUERS) aerosol 1 Application Patient Transfer   lidocaine  (PF) (XYLOCAINE ) 1 % injection 5 mL Patient Transfer   lidocaine -prilocaine  (EMLA ) cream 1 Application Patient Transfer   heparin  injection 1,000 Units Patient Transfer   anticoagulant sodium citrate  solution 5 mL Patient Transfer   alteplase  (CATHFLO ACTIVASE ) injection 2 mg Patient Transfer   Tenapanor  HCl, CKD, 30 MG TABS    ezetimibe  (ZETIA ) 10 MG tablet    vitamin C  (ASCORBIC ACID ) 250 MG tablet    apixaban  (ELIQUIS ) tablet 5 mg    sevelamer  carbonate (RENVELA ) tablet 800 mg    sevelamer  carbonate (RENVELA ) tablet 800 mg    atorvastatin  (LIPITOR) tablet 20  mg    ezetimibe  (ZETIA ) tablet 10 mg     Social History:  reports that she has been smoking cigarettes. She started smoking about 52 years ago. She has a 25 pack-year smoking history. She has been exposed to tobacco smoke. She has quit using smokeless tobacco. She reports that she does not currently use alcohol. She reports that she does not currently use drugs after having used the following drugs: Oxycodone .  Family History:   Family History  Problem Relation Age of Onset   Cancer Mother 50       breast and bone   Cancer Father 28       prostate   Hypertension Sister    Bleeding Disorder Sister    Hypertension Daughter    Cancer Cousin 20        breast cancer    Congestive Heart Failure Son    Breast cancer Neg Hx     Blood pressure 109/62, pulse 92, temperature 98.2 F (36.8 C), temperature source Oral, resp. rate 17, height 5' 2 (1.575 m), weight 92.1 kg, SpO2 99%. Physical Exam: Expand All Collapse All  Reason for Consult: ESRD   Chief Complaint: Hypotension   Dialysis orders [facility] 3h 30 min MWF 2/2 400/600 EDW 90kg (recently has been leaving below this) Hb 14.8 1/21 not on ESA (last given 30mcg 09/13/24) Doxercalciferol     Assessment/Plan: ESRD -  on MWF.  Only got 2 hrs of treatment today and she's stating she doesn't want dialysis tomorrow, preferring to wait till Fri.  Will re-evaluate tomorrow and see if she's able to get her next HD on Fri; she's already below her EDW. Lengthy discussion again about permanent access which she continues to refuse; she will live and die with a catheter. She's had bad experiences with infiltrations previously.  Hypotension/volume  - BP in goal. Does not appear volume overloaded.  Under dry weight as mentioned above. Suspicious for infection; there was a small amount of pus expressed from the exit site.  Anemia of CKD - Hgb 16.7. No indication for ESA/iron  therapy with active infection; last ESA was given 09/13/24.   Secondary Hyperparathyroidism -  Ca in goal. Check phos. Continue VDRA and binders.   Nutrition - Renal diet w/fluid restrictions when advanced.  H/o COPD PAD s/p bypass Prior DVT/PE on Eliquis      HPI: Makelle Marrone is an 69 y.o. female  ESRD on HD MWF at [facility].  Past medical history significant for HTN, chronic diastolic HF, COPD, CAD, PAD s/p left fem pop 2014, Hx colon cancer in remission, OSA not on CPAP, OA, anxiety/depression, Hx DVT + PE on AC and history of hyperkalemia.  Patient has had to shorten treatments the past 2 treatments including today receiving only approximately 2 hours out of her 3-1/2-hour planned and prescribed treatment because  of hypotension.  This does occur occasionally but response to fluid but unfortunately at this time she received 500 milliliters of isotonic fluid with no improvement.  Patient was found diastolic pressure of 60 transported to the emergency department.  Patient has had a cough, nausea, weakness since Saturday, missed dialysis on Monday and has had generalized abdominal pain.  Patient has chronic diarrhea but denies any dark black tarry stool or hematochezia.  Patient does have some shortness of breath which is chronic and not any worse than usual.  Patient was given another fluid bolus in the emergency room and started on Levophed  .  CT of the chest  abdomen pelvis was performed showing a solid 6 mm nodule inferiorly in the right lower lobe as well as a new left upper lobe nodule.  Changes with prior subtotal colectomy with no distention or wall thickening/inflammation.  Patient with noted discomfort around the catheter insertion site for a few days.     ROS Pertinent items are noted in HPI.   Chemistry and CBC: Last Labs         Creatinine  Date/Time Value Ref Range Status  06/22/2024 12:22 PM 8.16 (HH) 0.44 - 1.00 mg/dL Final      Comment:      REPEATED TO VERIFY CRITICAL RESULT CALLED TO, READ BACK BY AND VERIFIED WITH: Anders Seena PEAK at 1332 by Aldona [name] (MT) 06/22/2024     05/22/2023 11:27 AM 7.43 (HH) 0.44 - 1.00 mg/dL Final      Comment:      REPEATED TO VERIFY CRITICAL RESULT CALLED TO, READ BACK BY AND VERIFIED WITH: Anders Seena PEAK at 1227 pm by Aldona [name] (MT) 05/22/2023     04/21/2019 12:00 AM 10.7 (A) 0.5 - 1.1 Final  06/04/2018 12:00 AM 5.7 (A) 0.5 - 1.1 Final  07/07/2017 12:35 PM 3.4 (HH) 0.6 - 1.1 mg/dL Final  98/94/7981 90:70 AM 3.4 Repeated and Verified (HH) 0.6 - 1.1 mg/dL Final  91/92/7982 87:64 PM 2.6 (H) 0.6 - 1.1 mg/dL Final  98/86/7983 96:79 PM 1.9 (H) 0.6 - 1.1 mg/dL Final  89/68/7985 87:58 PM 1.6 (H) 0.6 - 1.1 mg/dL Final  87/93/7986 89:43 AM 1.7 (H)  0.6 - 1.1 mg/dL Final           Creatinine, Ser  Date/Time Value Ref Range Status  01/26/2025 09:41 AM 9.50 (H) 0.44 - 1.00 mg/dL Final  94/77/7974 93:83 AM 7.11 (H) 0.44 - 1.00 mg/dL Final      Comment:      DELTA CHECK NOTED  05/19/2024 10:19 AM 4.59 (H) 0.44 - 1.00 mg/dL Final  94/85/7974 93:53 AM 10.79 (H) 0.44 - 1.00 mg/dL Final  94/88/7974 92:79 AM 8.84 (H) 0.44 - 1.00 mg/dL Final  98/69/7974 97:42 PM 7.35 (H) 0.44 - 1.00 mg/dL Final  87/96/7975 93:91 AM 7.27 (H) 0.44 - 1.00 mg/dL Final  87/97/7975 90:69 PM 13.13 (H) 0.44 - 1.00 mg/dL Final  87/97/7975 98:94 PM 13.38 (H) 0.44 - 1.00 mg/dL Final  95/90/7975 93:56 PM 9.81 (H) 0.44 - 1.00 mg/dL Final  87/87/7976 87:99 PM 9.10 (H) 0.44 - 1.00 mg/dL Final  93/98/7976 95:95 PM 7.91 (H) 0.57 - 1.00 mg/dL Final  94/77/7976 98:68 AM 9.12 (H) 0.44 - 1.00 mg/dL Final  94/78/7976 91:92 PM 9.05 (H) 0.44 - 1.00 mg/dL Final      Comment:      DELTA CHECK NOTED  05/18/2022 04:33 AM 5.49 (H) 0.44 - 1.00 mg/dL Final      Comment:      DELTA CHECK NOTED  05/17/2022 12:47 AM 9.04 (H) 0.44 - 1.00 mg/dL Final  94/81/7976 97:92 PM 8.21 (H) 0.44 - 1.00 mg/dL Final  94/94/7976 98:81 AM 10.62 (H) 0.44 - 1.00 mg/dL Final  94/96/7976 89:80 PM 7.90 (H) 0.44 - 1.00 mg/dL Final  94/96/7976 98:42 PM 15.55 (H) 0.44 - 1.00 mg/dL Final  94/96/7976 91:89 AM 15.23 (H) 0.44 - 1.00 mg/dL Final  88/82/7977 88:54 AM 6.96 (H) 0.57 - 1.00 mg/dL Final  92/75/7977 87:53 AM 6.33 (H) 0.44 - 1.00 mg/dL Final  92/76/7977 98:79 AM 9.47 (H) 0.44 - 1.00 mg/dL Final  92/77/7977 98:50 AM 8.09 (H) 0.44 -  1.00 mg/dL Final  92/79/7977 88:54 PM 5.80 (H) 0.44 - 1.00 mg/dL Final      Comment:      DELTA CHECK NOTED  07/18/2021 02:30 PM 8.91 (H) 0.44 - 1.00 mg/dL Final  92/80/7977 95:91 AM 7.20 (H) 0.44 - 1.00 mg/dL Final  92/81/7977 97:97 PM 7.31 (H) 0.44 - 1.00 mg/dL Final  93/97/7977 97:72 AM 5.98 (H) 0.44 - 1.00 mg/dL Final      Comment:      DELTA CHECK NOTED  05/30/2021  01:19 AM 9.84 (H) 0.44 - 1.00 mg/dL Final  94/68/7977 87:61 AM 7.54 (H) 0.44 - 1.00 mg/dL Final      Comment:      DELTA CHECK NOTED DIALYSIS    05/28/2021 08:42 AM 12.78 (H) 0.44 - 1.00 mg/dL Final  94/80/7977 87:80 PM 8.60 (HH) 0.44 - 1.00 mg/dL Final      Comment:      CRITICAL RESULT CALLED TO, READ BACK BY AND VERIFIED WITH: karen beach at 1308 by hflynt 05/17/21   04/25/2021 11:28 AM 4.34 (H) 0.57 - 1.00 mg/dL Final      Comment:      **Verified by repeat analysis**  03/15/2021 11:43 AM 6.27 (H) 0.57 - 1.00 mg/dL Final  97/92/7977 96:58 PM 4.81 (H) 0.44 - 1.00 mg/dL Final  88/96/7978 87:64 AM 10.69 (H) 0.44 - 1.00 mg/dL Final  88/97/7978 87:72 AM 8.04 (H) 0.44 - 1.00 mg/dL Final      Comment:      DELTA CHECK NOTED  10/30/2020 04:07 AM 12.21 (H) 0.44 - 1.00 mg/dL Final  94/79/7978 89:63 AM 6.98 (HH) 0.44 - 1.00 mg/dL Final      Comment:      CRITICAL RESULT CALLED TO, READ BACK BY AND VERIFIED WITH: karen beach by hflynt 05/18/20   05/10/2020 06:31 AM 8.44 (H) 0.44 - 1.00 mg/dL Final  94/88/7978 87:53 PM 5.80 (H) 0.44 - 1.00 mg/dL Final      Comment:      DELTA CHECK NOTED DIALYSIS    05/09/2020 07:08 AM 13.59 (H) 0.44 - 1.00 mg/dL Final  94/88/7978 87:94 AM 13.20 (H) 0.44 - 1.00 mg/dL Final  88/80/7979 87:60 PM 8.25 (HH) 0.44 - 1.00 mg/dL Final      Comment:      CRITICAL RESULT CALLED TO, READ BACK BY AND VERIFIED WITH: Mazie Boyer, RN at 1334 by CHRISTELLA Rattler   03/03/2019 05:14 AM 6.40 (H) 0.44 - 1.00 mg/dL Final      Comment:      DELTA CHECK NOTED  03/02/2019 04:35 PM 15.49 (H) 0.44 - 1.00 mg/dL Final  97/81/7979 98:85 PM 6.45 (HH) 0.44 - 1.00 mg/dL Final      Comment:      CRITICAL RESULT CALLED TO, READ BACK BY AND VERIFIED WITH: CHERYL ANDERSON,RN    06/11/2018 12:41 PM 6.05 (HH) 0.60 - 1.10 mg/dL Final      Comment:      CRITICAL RESULT CALLED TO, READ BACK BY AND VERIFIED WITH: SHERILL   05/07/2018 04:26 AM 8.95 (H) 0.44 - 1.00 mg/dL Final  94/91/7980 95:75  AM 6.99 (H) 0.44 - 1.00 mg/dL Final      Last Labs     Recent Labs  Lab 01/26/25 0941  NA 133*  K 4.8  CL 103  GLUCOSE 92  BUN 41*  CREATININE 9.50*      Last Labs      Recent Labs  Lab 01/26/25 0931 01/26/25 0941  WBC 11.6*  --  NEUTROABS 8.7*  --   HGB 14.7 16.7*  HCT 44.8 49.0*  MCV 90.0  --   PLT 150  --       Liver Function Tests: Last Labs  No results for input(s): AST, ALT, ALKPHOS, BILITOT, PROT, ALBUMIN  in the last 168 hours.   Last Labs  No results for input(s): LIPASE, AMYLASE in the last 168 hours.   Last Labs  No results for input(s): AMMONIA in the last 168 hours.   Cardiac Enzymes: Last Labs  No results for input(s): CKTOTAL, CKMB, CKMBINDEX, TROPONINI in the last 168 hours.   Iron  Studies:  Recent Labs (last 2 labs)  No results for input(s): IRON , TIBC, TRANSFERRIN, FERRITIN in the last 72 hours.   PT/INR: @LABRCNTIP (inr:5)   Xrays/Other Studies: ) Lab Results Last 48 Hours        Results for orders placed or performed during the hospital encounter of 01/26/25 (from the past 48 hours)  Resp panel by RT-PCR (RSV, Flu A&B, Covid) Anterior Nasal Swab     Status: None    Collection Time: 01/26/25  9:08 AM    Specimen: Anterior Nasal Swab  Result Value Ref Range    SARS Coronavirus 2 by RT PCR NEGATIVE NEGATIVE    Influenza A by PCR NEGATIVE NEGATIVE    Influenza B by PCR NEGATIVE NEGATIVE      Comment: (NOTE) The Xpert Xpress SARS-CoV-2/FLU/RSV plus assay is intended as an aid in the diagnosis of influenza from Nasopharyngeal swab specimens and should not be used as a sole basis for treatment. Nasal washings and aspirates are unacceptable for Xpert Xpress SARS-CoV-2/FLU/RSV testing.   Fact Sheet for Patients: bloggercourse.com   Fact Sheet for Healthcare Providers: seriousbroker.it   This test is not yet approved or cleared by the [place]   FDA and has been authorized for detection and/or diagnosis of SARS-CoV-2 by FDA under an Emergency Use Authorization (EUA). This EUA will remain in effect (meaning this test can be used) for the duration of the COVID-19 declaration under Section 564(b)(1) of the Act, 21 U.S.C. section 360bbb-3(b)(1), unless the authorization is terminated or revoked.        Resp Syncytial Virus by PCR NEGATIVE NEGATIVE      Comment: (NOTE) Fact Sheet for Patients: bloggercourse.com   Fact Sheet for Healthcare Providers: seriousbroker.it   This test is not yet approved or cleared by the [place]  FDA and has been authorized for detection and/or diagnosis of SARS-CoV-2 by FDA under an Emergency Use Authorization (EUA). This EUA will remain in effect (meaning this test can be used) for the duration of the COVID-19 declaration under Section 564(b)(1) of the Act, 21 U.S.C. section 360bbb-3(b)(1), unless the authorization is terminated or revoked.   Performed at [facility], [address] [place], [place] [address]    CBC with Differential     Status: Abnormal    Collection Time: 01/26/25  9:31 AM  Result Value Ref Range    WBC 11.6 (H) 4.0 - 10.5 K/uL    RBC 4.98 3.87 - 5.11 MIL/uL    Hemoglobin 14.7 12.0 - 15.0 g/dL    HCT 55.1 63.9 - 53.9 %    MCV 90.0 80.0 - 100.0 fL    MCH 29.5 26.0 - 34.0 pg    MCHC 32.8 30.0 - 36.0 g/dL    RDW 84.7 88.4 - 84.4 %    Platelets 150 150 - 400 K/uL    nRBC 0.0 0.0 - 0.2 %  Neutrophils Relative % 75 %    Neutro Abs 8.7 (H) 1.7 - 7.7 K/uL    Lymphocytes Relative 15 %    Lymphs Abs 1.8 0.7 - 4.0 K/uL    Monocytes Relative 7 %    Monocytes Absolute 0.8 0.1 - 1.0 K/uL    Eosinophils Relative 1 %    Eosinophils Absolute 0.1 0.0 - 0.5 K/uL    Basophils Relative 0 %    Basophils Absolute 0.1 0.0 - 0.1 K/uL    Immature Granulocytes 2 %    Abs Immature Granulocytes 0.17 (H) 0.00 - 0.07 K/uL       Comment: Performed at [facility], [address], [place], [place] [address]  I-stat chem 8, ED (not at [facility], [facility] or [facility])     Status: Abnormal    Collection Time: 01/26/25  9:41 AM  Result Value Ref Range    Sodium 133 (L) 135 - 145 mmol/L    Potassium 4.8 3.5 - 5.1 mmol/L    Chloride 103 98 - 111 mmol/L    BUN 41 (H) 8 - 23 mg/dL    Creatinine, Ser 0.49 (H) 0.44 - 1.00 mg/dL    Glucose, Bld 92 70 - 99 mg/dL      Comment: Glucose reference range applies only to samples taken after fasting for at least 8 hours.    Calcium , Ion 0.92 (L) 1.15 - 1.40 mmol/L    TCO2 18 (L) 22 - 32 mmol/L    Hemoglobin 16.7 (H) 12.0 - 15.0 g/dL    HCT 50.9 (H) 63.9 - 46.0 %  I-Stat CG4 Lactic Acid     Status: Abnormal    Collection Time: 01/26/25  9:42 AM  Result Value Ref Range    Lactic Acid, Venous 2.4 (HH) 0.5 - 1.9 mmol/L    Comment NOTIFIED PHYSICIAN    Glucose, capillary     Status: None    Collection Time: 01/26/25  2:02 PM  Result Value Ref Range    Glucose-Capillary 93 70 - 99 mg/dL      Comment: Glucose reference range applies only to samples taken after fasting for at least 8 hours.    *Note: Due to a large number of results and/or encounters for the requested time period, some results have not been displayed. A complete set of results can be found in Results Review.      Imaging Results (Last 48 hours)  DG Chest Portable 1 View Result Date: 01/26/2025 CLINICAL DATA:  Sepsis. EXAM: PORTABLE CHEST 1 VIEW COMPARISON:  05/19/2024 been CT chest 01/26/2025. FINDINGS: Trachea is midline. Heart size stable. Left IJ dialysis catheter tips are in the SVC and right atrium. Mild basilar streaky opacification. No pleural fluid. IMPRESSION: Minimal bibasilar subsegmental atelectasis. Electronically Signed   By: Newell Eke M.D.   On: 01/26/2025 11:34    CT Angio Chest PE W and/or Wo Contrast Result Date: 01/26/2025 CLINICAL DATA:  history of DVT. Hypotension and tachy. Likely sepsis. Eval for  source and PE; sepsis. Diarrhea. Pain in llq. Eval for diverticulitis EXAM: CT ANGIOGRAPHY CHEST CT ABDOMEN AND PELVIS WITH CONTRAST TECHNIQUE: Multidetector CT imaging of the chest was performed using the standard protocol during bolus administration of intravenous contrast. Multiplanar CT image reconstructions and MIPs were obtained to evaluate the vascular anatomy. Multidetector CT imaging of the abdomen and pelvis was performed using the standard protocol during bolus administration of intravenous contrast. RADIATION DOSE REDUCTION: This exam was performed according to the departmental  dose-optimization program which includes automated exposure control, adjustment of the mA and/or kV according to patient size and/or use of iterative reconstruction technique. CONTRAST:  75mL OMNIPAQUE  IOHEXOL  350 MG/ML SOLN COMPARISON:  Chest CT 05/19/2024 and 05/09/2024. Abdominopelvic CT 12/01/2023. FINDINGS: CTA CHEST FINDINGS Cardiovascular: The pulmonary arteries are well opacified with contrast to the level of the segmental branches. There is no evidence of acute pulmonary embolism. There is central enlargement of the pulmonary arteries. Diffuse atherosclerosis of the aorta, great vessels and coronary arteries with calcifications of the aortic valve. The heart size is normal. There is no pericardial effusion. Mediastinum/Nodes: There are no enlarged mediastinal, hilar or axillary lymph nodes. The thyroid  gland, trachea and esophagus demonstrate no significant findings. Lungs/Pleura: No pleural effusion or pneumothorax. Mild centrilobular emphysema and central airway thickening. Interval improvement in previously demonstrated linear right lung atelectasis. There is a new part solid nodule inferiorly in the right lower lobe, measuring 6 x 6 mm on image 57/12. 3 mm left upper lobe nodule on image 37/12 appears new. No confluent airspace disease. Musculoskeletal/Chest wall: No chest wall mass or suspicious osseous findings.  Multilevel spondylosis. CT ABDOMEN AND PELVIS FINDINGS Hepatobiliary: The liver is normal in density without suspicious focal abnormality. No significant biliary dilatation status post cholecystectomy. Pancreas: Unremarkable. No pancreatic ductal dilatation or surrounding inflammatory changes. Spleen: Stable splenic lobularity and scarring. No focal abnormality. Adrenals/Urinary Tract: Both adrenal glands appear normal. No evidence of urinary tract calculus, hydronephrosis or perinephric soft tissue stranding. The kidneys appear unchanged with cortical thinning and simple appearing cysts bilaterally for which no specific follow-up imaging is recommended. The urinary bladder is nearly empty and suboptimally evaluated. Stomach/Bowel: No enteric contrast administered. Stable small hiatal hernia. The stomach otherwise appears unremarkable for its degree of distention. Stable postsurgical changes from subtotal colectomy with ileorectal anastomosis. The rectum is fluid-filled without significant distension, wall thickening or surrounding inflammation. Vascular/Lymphatic: There are no enlarged abdominal or pelvic lymph nodes. Aortic and branch vessel atherosclerosis without evidence of aneurysm or large vessel occlusion. Reproductive: Status post hysterectomy. No evidence of adnexal mass. Other: Postsurgical changes in the anterior abdominal wall with stable small umbilical hernia containing only fat. No ascites or pneumoperitoneum. Musculoskeletal: No acute or significant osseous findings. Review of the MIP images confirms the above findings. IMPRESSION: 1. No evidence of acute pulmonary embolism or other acute vascular findings in the chest. 2. New part solid 6 mm nodule inferiorly in the right lower lobe and new 3 mm left upper lobe nodule, indeterminate. Follow-up non-contrast CT recommended at 3-6 months to confirm persistence. If unchanged, and solid component remains <6 mm, annual CT is recommended until 5 years of  stability has been established. If persistent these nodules should be considered highly suspicious if the solid component of the nodule is 6 mm or greater in size and enlarging. This recommendation follows the consensus statement: Guidelines for Management of Incidental Pulmonary Nodules Detected on CT Images: From the Fleischner Society 2017; Radiology 2017; 284:228-243. 3. No acute findings in the abdomen or pelvis. 4. Stable postsurgical changes from subtotal colectomy with ileorectal anastomosis. The rectum is fluid-filled without significant distension, wall thickening or surrounding inflammation. 5. Aortic Atherosclerosis (ICD10-I70.0) and Emphysema (ICD10-J43.9). Electronically Signed   By: Elsie Perone M.D.   On: 01/26/2025 10:48    CT ABDOMEN PELVIS W CONTRAST Result Date: 01/26/2025 CLINICAL DATA:  history of DVT. Hypotension and tachy. Likely sepsis. Eval for source and PE; sepsis. Diarrhea. Pain in llq. Eval for diverticulitis EXAM:  CT ANGIOGRAPHY CHEST CT ABDOMEN AND PELVIS WITH CONTRAST TECHNIQUE: Multidetector CT imaging of the chest was performed using the standard protocol during bolus administration of intravenous contrast. Multiplanar CT image reconstructions and MIPs were obtained to evaluate the vascular anatomy. Multidetector CT imaging of the abdomen and pelvis was performed using the standard protocol during bolus administration of intravenous contrast. RADIATION DOSE REDUCTION: This exam was performed according to the departmental dose-optimization program which includes automated exposure control, adjustment of the mA and/or kV according to patient size and/or use of iterative reconstruction technique. CONTRAST:  75mL OMNIPAQUE  IOHEXOL  350 MG/ML SOLN COMPARISON:  Chest CT 05/19/2024 and 05/09/2024. Abdominopelvic CT 12/01/2023. FINDINGS: CTA CHEST FINDINGS Cardiovascular: The pulmonary arteries are well opacified with contrast to the level of the segmental branches. There is no  evidence of acute pulmonary embolism. There is central enlargement of the pulmonary arteries. Diffuse atherosclerosis of the aorta, great vessels and coronary arteries with calcifications of the aortic valve. The heart size is normal. There is no pericardial effusion. Mediastinum/Nodes: There are no enlarged mediastinal, hilar or axillary lymph nodes. The thyroid  gland, trachea and esophagus demonstrate no significant findings. Lungs/Pleura: No pleural effusion or pneumothorax. Mild centrilobular emphysema and central airway thickening. Interval improvement in previously demonstrated linear right lung atelectasis. There is a new part solid nodule inferiorly in the right lower lobe, measuring 6 x 6 mm on image 57/12. 3 mm left upper lobe nodule on image 37/12 appears new. No confluent airspace disease. Musculoskeletal/Chest wall: No chest wall mass or suspicious osseous findings. Multilevel spondylosis. CT ABDOMEN AND PELVIS FINDINGS Hepatobiliary: The liver is normal in density without suspicious focal abnormality. No significant biliary dilatation status post cholecystectomy. Pancreas: Unremarkable. No pancreatic ductal dilatation or surrounding inflammatory changes. Spleen: Stable splenic lobularity and scarring. No focal abnormality. Adrenals/Urinary Tract: Both adrenal glands appear normal. No evidence of urinary tract calculus, hydronephrosis or perinephric soft tissue stranding. The kidneys appear unchanged with cortical thinning and simple appearing cysts bilaterally for which no specific follow-up imaging is recommended. The urinary bladder is nearly empty and suboptimally evaluated. Stomach/Bowel: No enteric contrast administered. Stable small hiatal hernia. The stomach otherwise appears unremarkable for its degree of distention. Stable postsurgical changes from subtotal colectomy with ileorectal anastomosis. The rectum is fluid-filled without significant distension, wall thickening or surrounding  inflammation. Vascular/Lymphatic: There are no enlarged abdominal or pelvic lymph nodes. Aortic and branch vessel atherosclerosis without evidence of aneurysm or large vessel occlusion. Reproductive: Status post hysterectomy. No evidence of adnexal mass. Other: Postsurgical changes in the anterior abdominal wall with stable small umbilical hernia containing only fat. No ascites or pneumoperitoneum. Musculoskeletal: No acute or significant osseous findings. Review of the MIP images confirms the above findings. IMPRESSION: 1. No evidence of acute pulmonary embolism or other acute vascular findings in the chest. 2. New part solid 6 mm nodule inferiorly in the right lower lobe and new 3 mm left upper lobe nodule, indeterminate. Follow-up non-contrast CT recommended at 3-6 months to confirm persistence. If unchanged, and solid component remains <6 mm, annual CT is recommended until 5 years of stability has been established. If persistent these nodules should be considered highly suspicious if the solid component of the nodule is 6 mm or greater in size and enlarging. This recommendation follows the consensus statement: Guidelines for Management of Incidental Pulmonary Nodules Detected on CT Images: From the Fleischner Society 2017; Radiology 2017; 284:228-243. 3. No acute findings in the abdomen or pelvis. 4. Stable postsurgical changes from subtotal colectomy  with ileorectal anastomosis. The rectum is fluid-filled without significant distension, wall thickening or surrounding inflammation. 5. Aortic Atherosclerosis (ICD10-I70.0) and Emphysema (ICD10-J43.9). Electronically Signed   By: Elsie Perone M.D.   On: 01/26/2025 10:48        PMH:       Past Medical History:  Diagnosis Date   Acute left-sided low back pain without sciatica 02/22/2024   Anemia     Anxiety     CHF (congestive heart failure) (HCC)     Colon cancer (HCC)      treatment surgery   Complication of anesthesia      after first C- [place]  couldnt walk after, patient denies having a spinal   COPD (chronic obstructive pulmonary disease) (HCC)     Coronary artery disease     Depression     DVT (deep venous thrombosis) (HCC)     Encounter for annual health examination 08/05/2023   ESRD on hemodialysis (HCC)      Hemo: MWF   History of blood transfusion 04/2018   Hypertension      07/07/18- no longer takes BP medications   Left hip pain 05/16/2022   Left leg pain 05/09/2020   Leucocytosis 04/28/2012   Meningitis     Orthostatic hypotension 05/16/2022   Pain in limb 07/30/2013   PE (pulmonary embolism)     Peripheral vascular disease     Primary hypertension 11/15/2013   Rash 11/25/2023   Restless legs     Shortness of breath      with exertion   Sleep apnea     SOB (shortness of breath) 03/03/2019   Vertigo            PSH:        Past Surgical History:  Procedure Laterality Date   ABDOMINAL AORTAGRAM N/A 07/26/2013    Procedure: ABDOMINAL EZELLA;  Surgeon: Redell LITTIE Door, MD;  Location: [facility] CATH LAB;  Service: Cardiovascular;  Laterality: N/A;   ABDOMINAL HYSTERECTOMY       AV FISTULA PLACEMENT Left 05/05/2018    Procedure: ARTERIOVENOUS (AV) FISTULA CREATION BRACHIOCEPHALIC;  Surgeon: Sheree Penne Bruckner, MD;  Location: [facility] OR;  Service: Vascular;  Laterality: Left;   AV FISTULA PLACEMENT Left 07/16/2018    Procedure: ARTERIOVENOUS FISTULA CREATION;  Surgeon: Sheree Penne Bruckner, MD;  Location: [facility] OR;  Service: Vascular;  Laterality: Left;   BASCILIC VEIN TRANSPOSITION Left 09/03/2018    Procedure: BASILIC VEIN TRANSPOSITION SECOND STAGE;  Surgeon: Sheree Penne Bruckner, MD;  Location: [facility] OR;  Service: Vascular;  Laterality: Left;   BREAST BIOPSY Left     CESAREAN SECTION        X 3 1974-1977   CHOLECYSTECTOMY       CHOLECYSTECTOMY   1980/s   COLECTOMY   2010   DIVERTICULOSIS SURGERY-2002   2012   FEMORAL-POPLITEAL BYPASS GRAFT Left 08/13/2013    Procedure: BYPASS GRAFT FEMORAL-POPLITEAL ARTERY  WITH NON-REVERSED SAPHANEOUS VEIN; ULTRASOUND GUIDED;  Surgeon: Lynwood JONETTA Collum, MD;  Location: [facility] OR;  Service: Vascular;  Laterality: Left;   FLEXIBLE SIGMOIDOSCOPY N/A 12/10/2022    Procedure: FLEXIBLE SIGMOIDOSCOPY;  Surgeon: Rollin Dover, MD;  Location: [facility];  Service: Gastroenterology;  Laterality: N/A;   INSERTION OF DIALYSIS CATHETER Right 04/30/2018    Procedure: INSERTION OF Right Internal Jugular DIALYSIS CATHETER;  Surgeon: Serene Gaile ORN, MD;  Location: [facility] OR;  Service: Vascular;  Laterality: Right;   IR FLUORO GUIDE CV LINE LEFT   05/01/2022   IR  FLUORO GUIDE CV LINE LEFT   05/21/2022   IR FLUORO GUIDE CV LINE RIGHT   07/24/2020   IR FLUORO GUIDE CV LINE RIGHT   07/21/2021   IR FLUORO GUIDE CV LINE RIGHT   05/20/2022   IR PTA VENOUS EXCEPT DIALYSIS CIRCUIT   05/21/2022   IR REMOVAL TUN CV CATH W/O FL   07/21/2020   IR REMOVAL TUN CV CATH W/O FL   07/19/2021   IR REMOVAL TUN CV CATH W/O FL   05/17/2022   IR US  GUIDE VASC ACCESS LEFT   05/21/2022   IR US  GUIDE VASC ACCESS RIGHT   07/24/2020   IR US  GUIDE VASC ACCESS RIGHT   07/21/2021   IR US  GUIDE VASC ACCESS RIGHT   05/20/2022   IR VENOCAVAGRAM SVC   05/21/2022   LEFT HEART CATH AND CORONARY ANGIOGRAPHY N/A 05/01/2021    Procedure: LEFT HEART CATH AND CORONARY ANGIOGRAPHY;  Surgeon: Ladona Heinz, MD;  Location: [facility] INVASIVE CV LAB;  Service: Cardiovascular;  Laterality: N/A;   LEFT HEART CATH AND CORONARY ANGIOGRAPHY N/A 05/02/2022    Procedure: LEFT HEART CATH AND CORONARY ANGIOGRAPHY;  Surgeon: Ladona Heinz, MD;  Location: [facility] INVASIVE CV LAB;  Service: Cardiovascular;  Laterality: N/A;   LOWER EXTREMITY ANGIOGRAM Left 07/26/2013    Procedure: LOWER EXTREMITY ANGIOGRAM;  Surgeon: Redell LITTIE Door, MD;  Location: [facility] CATH LAB;  Service: Cardiovascular;  Laterality: Left;          Allergies: [Allergies]  [Allergies]      Allergen Reactions   Difelikefalin Itching   Clindamycin /Lincomycin Diarrhea and Itching   Gadolinium Derivatives Hives  and Other (See Comments)      HIVES, Desc: HIVES W/ DYE USED FOR 1ST CT SCAN BUT NOT 2ND, NO PREMEDS USED, PT UNCERTAIN OF CIRCUMSTANCES,,?POSSIBLE MRI CONTRAST ALLERGY, ALL STUDIES DONE SOMEWHERE IN [place] //A.C., Onset Date: [id number]     Medications:          Prior to Admission medications  Medication Sig Start Date End Date Taking? Authorizing Provider  acetaminophen  (TYLENOL ) 650 MG CR tablet Take 1,950 mg by mouth every 8 (eight) hours as needed for pain.     Yes [provider]  albuterol  (VENTOLIN  HFA) 108 (90 Base) MCG/ACT inhaler Inhale 2 puffs into the lungs every 6 (six) hours as needed for wheezing or shortness of breath. 12/17/24   Yes Georgina Speaks, FNP  atorvastatin  (LIPITOR) 20 MG tablet Take 1 tablet (20 mg total) by mouth daily. 12/17/24 01/26/25 Yes Georgina Speaks, FNP  Biotin  5 MG CAPS Take 10 mg by mouth daily at 6 (six) AM.     Yes [provider]  ELIQUIS  5 MG TABS tablet TAKE 1 TABLET (5 MG TOTAL) BY MOUTH 2 (TWO) TIMES DAILY. 11/22/24   Yes Georgina Speaks, FNP  ezetimibe  (ZETIA ) 10 MG tablet Take 1 tablet (10 mg total) by mouth daily. 12/02/24 03/02/25 Yes Ladona Heinz, MD  nicotine  (NICODERM CQ  - DOSED IN MG/24 HOURS) 21 mg/24hr patch Place 1 patch (21 mg total) onto the skin daily. 01/04/25 01/04/26 Yes Georgina Speaks, FNP  omeprazole  (PRILOSEC) 20 MG capsule TAKE 1 CAPSULE (20 MG TOTAL) BY MOUTH DAILY. 11/22/24   Yes Georgina Speaks, FNP  sevelamer  carbonate (RENVELA ) 800 MG tablet Take 800 mg by mouth at bedtime. 10/27/23   Yes [provider]  sodium zirconium cyclosilicate  (LOKELMA ) 10 g PACK packet Take 10 g by mouth daily.     Yes [provider]  vitamin E  200  UNIT capsule Take 200 Units by mouth daily.     Yes [provider]  meclizine  (ANTIVERT ) 12.5 MG tablet Take 1 tablet (12.5 mg total) by mouth 3 (three) times daily as needed for dizziness. 09/18/23     Georgina Speaks, FNP  midodrine  (PROAMATINE ) 5 MG tablet Take 1  tablet (5 mg total) by mouth 2 (two) times daily as needed (hypotension). Take one tab before dialysis Patient not taking: Reported on 01/26/2025 12/02/24     Ladona Heinz, MD  multivitamin (RENA-VIT) TABS tablet Take 1 tablet by mouth at bedtime. Patient not taking: Reported on 01/26/2025       [provider]  Tenapanor  HCl, CKD, 30 MG TABS Take 1 tablet by mouth daily. Patient not taking: Reported on 01/26/2025       [provider]  Tiotropium Bromide-Olodaterol (STIOLTO RESPIMAT ) 2.5-2.5 MCG/ACT AERS Inhale 2 puffs into the lungs daily. Patient not taking: Reported on 01/26/2025 05/04/24     Hunsucker, Donnice SAUNDERS, MD  vitamin C  (ASCORBIC ACID ) 250 MG tablet Take 250 mg by mouth daily. Patient not taking: Reported on 01/26/2025       [provider]      Discontinued Meds:       Medications Discontinued During This Encounter  Medication Reason   norepinephrine  (LEVOPHED ) 4mg  in (0.016 mg/mL) premix infusion        Social History:  reports that she has been smoking cigarettes. She started smoking about 52 years ago. She has a 25 pack-year smoking history. She has been exposed to tobacco smoke. She has quit using smokeless tobacco. She reports that she does not currently use alcohol. She reports that she does not currently use drugs after having used the following drugs: Oxycodone .   Family History:        Family History  Problem Relation Age of Onset   Cancer Mother 51        breast and bone   Cancer Father 41        prostate   Hypertension Sister     Bleeding Disorder Sister     Hypertension Daughter     Cancer Cousin 20        breast cancer    Congestive Heart Failure Son     Breast cancer Neg Hx            Blood pressure (!) 130/57, pulse (!) 104, temperature (!) 97.5 F (36.4 C), temperature source Oral, resp. rate 19, height 5' 2 (1.575 m), weight 90.1 kg, SpO2 98%. General: WDWN in NAD Head: NCAT sclera not icteric MMM Neck: Supple. No  lymphadenopathy Lungs: CTA bilaterally. No wheeze, rales or rhonchi. Breathing is unlabored on RA Heart: RRR. No murmur, rubs or gallops.  Abdomen: soft, +epigastric tenderness, non distended Lower extremities:no edema, ischemic changes, or open wounds  Neuro: AAOx3. Moves all extremities spontaneously. Psych:  Responds to questions appropriately with a normal affect. Dialysis Access: [facility], very small amount of pus expressed from exit site         Shanna Strength, LYNWOOD ORN, MD 01/30/2025, 7:44 AM      [1]  Allergies Allergen Reactions   Difelikefalin Itching   Clindamycin /Lincomycin Diarrhea and Itching   Gadolinium Derivatives Hives and Other (See Comments)    HIVES, Desc: HIVES W/ DYE USED FOR 1ST CT SCAN BUT NOT 2ND, NO PREMEDS USED, PT UNCERTAIN OF CIRCUMSTANCES,,?POSSIBLE MRI CONTRAST ALLERGY, ALL STUDIES DONE SOMEWHERE IN [place] //A.C., Onset Date: [id number]   Wound Dressing  Adhesive Itching

## 2025-01-31 ENCOUNTER — Inpatient Hospital Stay (HOSPITAL_COMMUNITY)

## 2025-01-31 DIAGNOSIS — A419 Sepsis, unspecified organism: Secondary | ICD-10-CM | POA: Diagnosis not present

## 2025-01-31 DIAGNOSIS — I951 Orthostatic hypotension: Secondary | ICD-10-CM | POA: Diagnosis not present

## 2025-01-31 DIAGNOSIS — I739 Peripheral vascular disease, unspecified: Secondary | ICD-10-CM | POA: Diagnosis not present

## 2025-01-31 LAB — CULTURE, BLOOD (ROUTINE X 2)
Culture: NO GROWTH
Culture: NO GROWTH
Special Requests: ADEQUATE

## 2025-01-31 MED ORDER — MIDAZOLAM HCL 2 MG/2ML IJ SOLN
INTRAMUSCULAR | Status: AC
  Filled 2025-01-31: qty 2

## 2025-01-31 MED ORDER — ACETAMINOPHEN 325 MG PO TABS
ORAL_TABLET | ORAL | Status: AC
  Filled 2025-01-31: qty 2

## 2025-01-31 MED ORDER — FENTANYL CITRATE (PF) 100 MCG/2ML IJ SOLN
INTRAMUSCULAR | Status: AC
  Filled 2025-01-31: qty 2

## 2025-01-31 MED ORDER — HEPARIN SODIUM (PORCINE) 1000 UNIT/ML IJ SOLN
INTRAMUSCULAR | Status: AC
  Filled 2025-01-31: qty 10

## 2025-01-31 MED ORDER — DOXERCALCIFEROL 4 MCG/2ML IV SOLN
INTRAVENOUS | Status: AC
  Filled 2025-01-31: qty 2

## 2025-01-31 MED ORDER — FENTANYL CITRATE (PF) 100 MCG/2ML IJ SOLN
INTRAMUSCULAR | Status: AC | PRN
  Administered 2025-01-31 (×2): 50 ug via INTRAVENOUS

## 2025-01-31 MED ORDER — LIDOCAINE-EPINEPHRINE 1 %-1:100000 IJ SOLN
INTRAMUSCULAR | Status: AC
  Filled 2025-01-31: qty 1

## 2025-01-31 MED ORDER — LIDOCAINE-EPINEPHRINE 1 %-1:100000 IJ SOLN
INTRAMUSCULAR | Status: AC
  Filled 2025-01-31: qty 20

## 2025-01-31 MED ORDER — MIDAZOLAM HCL (PF) 2 MG/2ML IJ SOLN
INTRAMUSCULAR | Status: AC | PRN
  Administered 2025-01-31 (×2): 1 mg via INTRAVENOUS

## 2025-01-31 MED ORDER — CEFAZOLIN SODIUM-DEXTROSE 2-4 GM/100ML-% IV SOLN
INTRAVENOUS | Status: AC | PRN
  Administered 2025-01-31: 2 g via INTRAVENOUS

## 2025-01-31 MED ORDER — CEFAZOLIN SODIUM-DEXTROSE 2-4 GM/100ML-% IV SOLN
INTRAVENOUS | Status: AC
  Filled 2025-01-31: qty 100

## 2025-01-31 MED ORDER — VANCOMYCIN HCL IN DEXTROSE 1-5 GM/200ML-% IV SOLN
INTRAVENOUS | Status: AC
  Filled 2025-01-31: qty 200

## 2025-01-31 MED ORDER — HEPARIN SODIUM (PORCINE) 1000 UNIT/ML IJ SOLN
INTRAMUSCULAR | Status: AC
  Filled 2025-01-31: qty 5

## 2025-01-31 MED ORDER — LIDOCAINE-EPINEPHRINE 1 %-1:100000 IJ SOLN
20.0000 mL | Freq: Once | INTRAMUSCULAR | Status: AC
  Administered 2025-01-31: 10 mL via INTRADERMAL
  Filled 2025-01-31: qty 20

## 2025-01-31 MED ORDER — HEPARIN SODIUM (PORCINE) 1000 UNIT/ML IJ SOLN
4200.0000 [IU] | Freq: Once | INTRAMUSCULAR | Status: AC
  Administered 2025-01-31: 4200 [IU]

## 2025-01-31 NOTE — Procedures (Signed)
 Interventional Radiology Procedure Note  Procedure: Fluoro guided conversion to temporary left EJ hemodialysis catheter  Complications: None  Estimated Blood Loss: < 10 mL  Findings: Fluoro guided conversion of hemodialysis access.  The patient has an infected tunneled HD line and no further access above the waist.  She is refusing placement of a femoral line.  Current tunnel has pus/mucous.  Tip cultured.  Cordella DELENA Banner, MD

## 2025-01-31 NOTE — TOC Progression Note (Signed)
 Transition of Care [facility]) - Progression Note    Patient Details  Name: Whitney Walker MRN: [id number] Date of Birth: 1956/03/28  Transition of Care [facility]) CM/SW Contact  Landry DELENA Senters, RN Phone Number: 01/31/2025, 12:34 PM  Clinical Narrative:    Plan for IR today to address infected HD cath. Potential plan for d/c today or tomorrow.   CM will continue to follow.   Expected Discharge Plan:  (TBD) Barriers to Discharge: Continued Medical Work up               Expected Discharge Plan and Services       Living arrangements for the past 2 months: Single Family Home                                       Social Drivers of Health (SDOH) Interventions SDOH Screenings   Food Insecurity: No Food Insecurity (01/27/2025)  Housing: Low Risk (01/27/2025)  Transportation Needs: No Transportation Needs (01/27/2025)  Utilities: Not At Risk (01/27/2025)  Alcohol Screen: Low Risk (03/30/2024)  Depression (PHQ2-9): Low Risk (06/22/2024)  Financial Resource Strain: Low Risk (03/30/2024)  Physical Activity: Insufficiently Active (05/06/2024)  Social Connections: Moderately Isolated (01/27/2025)  Stress: No Stress Concern Present (03/30/2024)  Tobacco Use: High Risk (01/26/2025)  Health Literacy: Adequate Health Literacy (05/06/2024)    Readmission Risk Interventions    05/20/2024    2:36 PM 05/21/2022    4:20 PM  Readmission Risk Prevention Plan  Transportation Screening Complete Complete  PCP or Specialist Appt within 3-5 Days Complete   HRI or Home Care Consult Complete   Social Work Consult for Recovery Care Planning/Counseling Complete   Palliative Care Screening Not Applicable   Medication Review Oceanographer) Referral to Pharmacy Referral to Pharmacy  HRI or Home Care Consult  Complete  SW Recovery Care/Counseling Consult  Complete  Palliative Care Screening  Not Applicable  Skilled Nursing Facility  Not Applicable

## 2025-01-31 NOTE — Plan of Care (Signed)

## 2025-02-01 ENCOUNTER — Telehealth

## 2025-02-01 MED ORDER — TRAZODONE HCL 50 MG PO TABS
50.0000 mg | ORAL_TABLET | Freq: Every evening | ORAL | Status: AC | PRN
  Administered 2025-02-01 – 2025-02-04 (×3): 50 mg via ORAL
  Filled 2025-02-01 (×2): qty 1

## 2025-02-02 ENCOUNTER — Inpatient Hospital Stay (HOSPITAL_COMMUNITY)

## 2025-02-02 ENCOUNTER — Telehealth

## 2025-02-02 ENCOUNTER — Telehealth: Payer: Self-pay | Admitting: Pharmacist

## 2025-02-02 DIAGNOSIS — Z720 Tobacco use: Secondary | ICD-10-CM

## 2025-02-02 LAB — RENAL FUNCTION PANEL
Albumin: 3.5 g/dL (ref 3.5–5.0)
Anion gap: 14 (ref 5–15)
BUN: 40 mg/dL — ABNORMAL HIGH (ref 8–23)
CO2: 21 mmol/L — ABNORMAL LOW (ref 22–32)
Calcium: 9.3 mg/dL (ref 8.9–10.3)
Chloride: 97 mmol/L — ABNORMAL LOW (ref 98–111)
Creatinine, Ser: 10.2 mg/dL — ABNORMAL HIGH (ref 0.44–1.00)
GFR, Estimated: 4 mL/min — ABNORMAL LOW
Glucose, Bld: 139 mg/dL — ABNORMAL HIGH (ref 70–99)
Phosphorus: 4.9 mg/dL — ABNORMAL HIGH (ref 2.5–4.6)
Potassium: 5.6 mmol/L — ABNORMAL HIGH (ref 3.5–5.1)
Sodium: 132 mmol/L — ABNORMAL LOW (ref 135–145)

## 2025-02-02 LAB — CBC
HCT: 42.4 % (ref 36.0–46.0)
Hemoglobin: 13.3 g/dL (ref 12.0–15.0)
MCH: 29.2 pg (ref 26.0–34.0)
MCHC: 31.4 g/dL (ref 30.0–36.0)
MCV: 93 fL (ref 80.0–100.0)
Platelets: 171 10*3/uL (ref 150–400)
RBC: 4.56 MIL/uL (ref 3.87–5.11)
RDW: 15.2 % (ref 11.5–15.5)
WBC: 10.5 10*3/uL (ref 4.0–10.5)
nRBC: 0 % (ref 0.0–0.2)

## 2025-02-02 MED ORDER — PENTAFLUOROPROP-TETRAFLUOROETH EX AERO: 1.0000 | INHALATION_SPRAY | CUTANEOUS | Status: DC | PRN

## 2025-02-02 MED ORDER — HEPARIN SODIUM (PORCINE) 1000 UNIT/ML IJ SOLN
INTRAMUSCULAR | Status: AC
  Filled 2025-02-02: qty 2

## 2025-02-02 MED ORDER — ALTEPLASE 2 MG IJ SOLR: 2.0000 mg | Freq: Once | INTRAMUSCULAR | Status: DC | PRN

## 2025-02-02 MED ORDER — SODIUM ZIRCONIUM CYCLOSILICATE 10 G PO PACK
10.0000 g | PACK | Freq: Once | ORAL | Status: AC
  Administered 2025-02-02: 10 g via ORAL
  Filled 2025-02-02: qty 1

## 2025-02-02 MED ORDER — CEFAZOLIN SODIUM-DEXTROSE 2-4 GM/100ML-% IV SOLN
2.0000 g | INTRAVENOUS | Status: AC
  Administered 2025-02-03: 2 g via INTRAVENOUS
  Filled 2025-02-02: qty 100

## 2025-02-02 MED ORDER — HEPARIN SODIUM (PORCINE) 1000 UNIT/ML DIALYSIS: 1000.0000 [IU] | INTRAMUSCULAR | Status: DC | PRN

## 2025-02-02 MED ORDER — HEPARIN SODIUM (PORCINE) 1000 UNIT/ML DIALYSIS
20.0000 [IU]/kg | INTRAMUSCULAR | Status: DC | PRN
  Administered 2025-02-02: 1800 [IU] via INTRAVENOUS_CENTRAL

## 2025-02-02 MED ORDER — ALTEPLASE 2 MG IJ SOLR
2.0000 mg | Freq: Once | INTRAMUSCULAR | Status: AC
  Administered 2025-02-02: 2 mg
  Filled 2025-02-02: qty 2

## 2025-02-02 NOTE — Progress Notes (Signed)
 "   Referring Physician(s): Jake Maisie Fellows, PA-C   Supervising Physician: Philip Cornet  Patient Status:  Whitney Walker - In-pt  Chief Complaint:  Hemodialysis   Subjective:  ESRD Pt known to IR Tunneled dialysis catheter removed left internal jugular 01/31/25 in IR Infection suspected by look of catheter and tract per IR dictation Sent for Cx and tip for Cx--- all neg to date  Request for new tunneled dialysis catheter in IR 02/03/25  01/31/25 IR: Removal of the tunneled hemodialysis catheter which demonstrated Pus in the tract. The catheter tip was cut and sent to laboratory for culture and sensitivity. A new non tunneled Trialysis catheter was placed in the last venous access (left external jugular vein) this patient has. The patient refuses femoral access. A pursestring suture placed at the access site secondary to a small cut down for tunneled line removal. Plan for removal of the pursestring suture in 1-2 days. Plan for exchange/conversion of non tunneled hemodialysis catheter for a tunneled hemodialysis catheter maintaining left external jugular vein access for this patient prior to discharge  Scheduled now for new tunneled/conversion of non tunneled catheter  Afeb Wbc wnl All Cx neg   Allergies: Difelikefalin, Clindamycin /lincomycin, Gadolinium derivatives, Chlorhexidine , and Wound dressing adhesive  Medications: Prior to Admission medications  Medication Sig Start Date End Date Taking? Authorizing Provider  acetaminophen  (TYLENOL ) 650 MG CR tablet Take 1,950 mg by mouth every 8 (eight) hours as needed for pain.   Yes [provider]  albuterol  (VENTOLIN  HFA) 108 (90 Base) MCG/ACT inhaler Inhale 2 puffs into the lungs every 6 (six) hours as needed for wheezing or shortness of breath. 12/17/24  Yes Georgina Speaks, FNP  atorvastatin  (LIPITOR) 20 MG tablet Take 1 tablet (20 mg total) by mouth daily. Patient taking differently: Take 20 mg by mouth at bedtime. 12/17/24  01/26/25 Yes Georgina Speaks, FNP  Biotin  10 MG CAPS Take 10 mg by mouth daily at 12 noon.   Yes [provider]  diclofenac  Sodium (VOLTAREN  ARTHRITIS PAIN) 1 % GEL Apply 1 Application topically as needed.   Yes [provider]  ELIQUIS  5 MG TABS tablet TAKE 1 TABLET (5 MG TOTAL) BY MOUTH 2 (TWO) TIMES DAILY. 11/22/24  Yes Georgina Speaks, FNP  ezetimibe  (ZETIA ) 10 MG tablet Take 10 mg by mouth at bedtime.   Yes [provider]  meclizine  (ANTIVERT ) 12.5 MG tablet Take 1 tablet (12.5 mg total) by mouth 3 (three) times daily as needed for dizziness. 09/18/23  Yes Georgina Speaks, FNP  midodrine  (PROAMATINE ) 5 MG tablet Take 1 tablet (5 mg total) by mouth 2 (two) times daily as needed (hypotension). Take one tab before dialysis Patient taking differently: Take 5 mg by mouth 2 (two) times daily as needed (hypotension). Take one extra tablet before dialysis 12/02/24  Yes Ladona Heinz, MD  multivitamin (RENA-VIT) TABS tablet Take 1 tablet by mouth at bedtime.   Yes [provider]  omeprazole  (PRILOSEC) 20 MG capsule TAKE 1 CAPSULE (20 MG TOTAL) BY MOUTH DAILY. 11/22/24  Yes Georgina Speaks, FNP  sevelamer  carbonate (RENVELA ) 800 MG tablet Take 1,600-2,400 mg by mouth See admin instructions. TAKE 3 TABLETS BY MOUTH THREE TIMES DAILY WITH MEALS AND 2 TABLETS TWICE DAILY WITH SNACKS. 10/27/23  Yes [provider]  sodium zirconium cyclosilicate  (LOKELMA ) 10 g PACK packet Take 10 g by mouth daily.   Yes [provider]  Tenapanor  HCl, CKD, (XPHOZAH ) 30 MG TABS Take 1 tablet by mouth in the morning and  at bedtime.   Yes [provider]  Tiotropium Bromide-Olodaterol (STIOLTO RESPIMAT ) 2.5-2.5 MCG/ACT AERS Inhale 2 puffs into the lungs daily. Patient taking differently: Inhale 2 puffs into the lungs as needed. 05/04/24  Yes Hunsucker, Donnice SAUNDERS, MD  vitamin E  200 UNIT capsule Take 200 Units by mouth daily.   Yes [provider]  nicotine  (NICODERM CQ  -  DOSED IN MG/24 HOURS) 21 mg/24hr patch Place 1 patch (21 mg total) onto the skin daily. Patient not taking: Reported on 01/28/2025 01/04/25 01/04/26  Georgina Speaks, FNP     Vital Signs: BP (!) 112/53   Pulse (!) 102   Temp 97.7 F (36.5 C)   Resp 16   Ht 5' 2 (1.575 m)   Wt (S) 201 lb 15.1 oz (91.6 kg) Comment: Bed Scale  SpO2 98%   BMI 36.94 kg/m   Physical Exam Vitals reviewed.  Skin:    General: Skin is warm and dry.     Comments: Skin site at Left internal jugular trialysis catheter is clean d dry Tender to touch No sign of infection  Neurological:     Mental Status: She is alert and oriented to person, place, and time.  Psychiatric:        Behavior: Behavior normal.     Imaging: DG CHEST PORT 1 VIEW Result Date: 02/02/2025 CLINICAL DATA:  Central line care. EXAM: PORTABLE CHEST 1 VIEW COMPARISON:  01/27/2025 FINDINGS: Left-sided dialysis catheter tip overlies the lower SVC. No pneumothorax. Mild cardiomegaly stable. Mediastinal contours are unchanged. Hazy opacity at the left lung base may represent pleural effusion or soft tissue attenuation. No confluent opacity. Slight vascular congestion. IMPRESSION: 1. Left-sided dialysis catheter tip overlies the lower SVC. No pneumothorax. 2. Hazy opacity at the left lung base may represent pleural effusion or soft tissue attenuation. Electronically Signed   By: Andrea Gasman M.D.   On: 02/02/2025 12:04   IR NON-TUNNELED CENTRAL VENOUS CATH [facility] W IMG Result Date: 02/01/2025 INDICATION: Limited venous access in a patient requiring hemodialysis secondary to end-stage renal disease. The patient has 1 upper extremity venous access and is catheter dependent with a tunneled external jugular vein hemodialysis catheter as her only access. Previously failed AV fistulous. The patient adamantly refuses femoral fistulas or femoral tunneled lines. A current indwelling catheter appears to be infected with pus in the subcutaneous tract. Several  discussions have taken place with the patient who continues to refuse femoral access. The plan will be to remove the tunneled hemodialysis catheter while preserving left external jugular vein access with a temporary line and replacing a new tunneled catheter at a later date using the non tunneled access for conversion. EXAM: Tunneled to temporary hemodialysis exchange. ANESTHESIA/SEDATION: Moderate (conscious) sedation was employed during this procedure. A total of Versed  2 mg and Fentanyl  100 mcg was administered intravenously by the radiology nurse. Total intra-service moderate Sedation Time: 43 minutes. The patient's level of consciousness and vital signs were monitored continuously by radiology nursing throughout the procedure under my direct supervision. FLUOROSCOPY: Radiation Exposure Index (as provided by the fluoroscopic device): 3 mGy Kerma COMPLICATIONS: None immediate. PROCEDURE: Informed written consent was obtained from the patient after a thorough discussion of the procedural risks, benefits and alternatives. All questions were addressed. Maximal Sterile Barrier Technique was utilized including caps, mask, sterile gowns, sterile gloves, sterile drape, hand hygiene and skin antiseptic. A timeout was performed prior to the initiation of the procedure. In a supine position the left chest and catheter were prepped  and draped in usual sterile fashion. Local anesthesia was achieved with 1% lidocaine . Evaluation of the catheter demonstrated mucous/pus in copious amounts at the subcutaneous tract. A small incision was made at the access site to the external jugular vein on the left side after using ultrasound to visualize the access point to the vein. The catheter was exposed, clamped, and cut at this location. The catheter cuff was then freed from the subcutaneous tract and the distal portion of the tunneled catheter removed from the patient. The tunneled hemodialysis catheter remnant was then partially  retracted into guidewires were then advanced under fluoroscopic guidance into the catheter. The catheter was then completely removed from the patient. A 24 cm Trialysis catheter was then advanced over the guidewire under fluoroscopic guidance until the catheter tip was identified at the cavoatrial junction. Guidewire removed. The catheter was tested for function and found to be functioning well. The catheter was then capped and flushed as per protocol. Retention suture applied. A second pursestring suture was then applied at the cutdown site overlying the left external jugular vein. Sterile dressing was applied. IMPRESSION: Removal of the tunneled hemodialysis catheter which demonstrated fossa in the tract. The catheter tip was cut and sent to laboratory for culture and sensitivity. A new non tunneled Trialysis catheter was placed in the last venous access (left external jugular vein) this patient has. The patient refuses femoral access. A pursestring suture placed at the access site secondary to a small cut down for tunneled line removal. Plan for removal of the pursestring suture in 1-2 days. Plan for exchange/conversion of non tunneled hemodialysis catheter for a tunneled hemodialysis catheter maintaining left external jugular vein access for this patient prior to discharge. Electronically Signed   By: Cordella Banner   On: 02/01/2025 16:09    Labs:  CBC: Recent Labs    01/26/25 0931 01/26/25 0941 01/27/25 0208 01/29/25 0456 02/02/25 0846  WBC 11.6*  --  11.2* 10.1 10.5  HGB 14.7 16.7* 14.7 13.3 13.3  HCT 44.8 49.0* 48.5* 40.1 42.4  PLT 150  --  150 149* 171    COAGS: No results for input(s): INR, APTT in the last 8760 hours.  BMP: Recent Labs    01/27/25 0208 01/27/25 0614 01/29/25 0456 02/02/25 0846  NA 128* 129* 132* 132*  K 6.1* 5.4* 4.3 5.6*  CL 95* 95* 94* 97*  CO2 13* 15* 22 21*  GLUCOSE 123* 124* 99 139*  BUN 46* 48* 30* 40*  CALCIUM  8.1* 8.4* 8.8* 9.3  CREATININE  11.40* 11.40* 8.56* 10.20*  GFRNONAA 3* 3* 5* 4*    LIVER FUNCTION TESTS: Recent Labs    05/12/24 0646 05/19/24 1019 06/22/24 1222 01/26/25 1436 01/29/25 0456 02/02/25 0846  BILITOT 0.7 0.6 0.4 1.0  --   --   AST 25 17 15  32  --   --   ALT 27 16 11 26   --   --   ALKPHOS 109 89 127* 130*  --   --   PROT 7.8 6.5 7.4 7.8  --   --   ALBUMIN  3.4* 2.9* 3.8 3.7 3.4* 3.5    Assessment and Plan:  Scheduled for conversion/new placement tunneled dialysis catheter in IR 02/03/25 Risks and benefits discussed with the patient including, but not limited to bleeding, infection, vascular injury, pneumothorax which may require chest tube placement, air embolism or even death  All of the patient's questions were answered, patient is agreeable to proceed. Consent signed and in chart.  Electronically  Signed: Sharlet DELENA Candle, PA-C 02/02/2025, 3:23 PM   I spent a total of 25 Minutes at the the patient's bedside AND on the patient's Walker floor or unit, greater than 50% of which was counseling/coordinating care for conversion non tunn to tunn dialysis catheter     "

## 2025-02-02 NOTE — Progress Notes (Signed)
" ° °  02/02/2025 Name: Whitney Walker MRN: [id number] DOB: 04/14/1956  Chief Complaint  Patient presents with   Medication Management    Patient was called to follow up . She is currently hospitalized for sepsis. The nurse was in the room with her when I called. She did request that I reach out to her VBCI Care Nurse, [name], RN to ask her to call. A message was left.  Plan: Follow up with Patient in 1 week. We had been working on smoking cessation.   Cassius DOROTHA Brought, PharmD, BCACP Clinical Pharmacist [phone]  "

## 2025-02-02 NOTE — Progress Notes (Signed)
 Redness noted at dressing application site surrounding dialysis catheter. Pt stated she has allergy to adhesives. Dressing changed and hypoallergenic dressing (SureSite Matrix) and biopatch applied. Primary nurse aware.

## 2025-02-02 NOTE — Patient Outreach (Signed)
 Reviewed patient's chart in preparation to contact patient for an RN CM follow up. Noted patient is currently inpatient at [facility] with an admit date of 01/26/25 for dx: Bacteremia. Alerted VBCI TOC nurse Andrea Dimes RN.   [name] RN BSN CCM [place]  [facility] Health Nurse Care Coordinator  Direct Dial : [phone] Website: [email or web address]

## 2025-02-02 NOTE — Progress Notes (Signed)
 " [facility] Progress Note   Subjective:  Seen in room. May have had reaction to catheter dressing which has been changed to hypoallergenic dressing.  Feels well, just frustrated by above issues. Denies f/c, cp, sob, n/v For dialysis today.   Objective Vitals:   02/02/25 0440 02/02/25 0742 02/02/25 1200 02/02/25 1201  BP: 114/76 (!) 131/100 (!) 152/109   Pulse: 95 90 87   Resp: 16 18 18    Temp: (!) 97.2 F (36.2 C) 97.8 F (36.6 C) 97.8 F (36.6 C) 97.8 F (36.6 C)  TempSrc: Oral Oral Oral Oral  SpO2:      Weight:      Height:         Additional Objective Labs: Basic Metabolic Panel: Recent Labs  Lab 01/27/25 0208 01/27/25 0614 01/29/25 0456 02/02/25 0846  NA 128* 129* 132* 132*  K 6.1* 5.4* 4.3 5.6*  CL 95* 95* 94* 97*  CO2 13* 15* 22 21*  GLUCOSE 123* 124* 99 139*  BUN 46* 48* 30* 40*  CREATININE 11.40* 11.40* 8.56* 10.20*  CALCIUM  8.1* 8.4* 8.8* 9.3  PHOS 6.5*  --  4.8* 4.9*   CBC: Recent Labs  Lab 01/27/25 0208 01/29/25 0456 02/02/25 0846  WBC 11.2* 10.1 10.5  HGB 14.7 13.3 13.3  HCT 48.5* 40.1 42.4  MCV 93.0 88.9 93.0  PLT 150 149* 171   Blood Culture    Component Value Date/Time   SDES CATH TIP 01/31/2025 1735   SPECREQUEST NONE 01/31/2025 1735   CULT  01/31/2025 1735    NO GROWTH 2 DAYS Performed at [facility], [address] [place], [place] [address]    REPTSTATUS PENDING 01/31/2025 1735   Physical Exam General: Alert, nad Heart: RRR Lungs: Clear, normal wob Abdomen: non-tender Extremities: no LE edema  Dialysis Access: L neck temp cath   Medications:  vancomycin  Stopped (01/31/25 1327)    vitamin C   250 mg Oral Daily   atorvastatin   20 mg Oral Q24H   Chlorhexidine  Gluconate Cloth  6 each Topical Q0600   doxercalciferol   4 mcg Oral Once per day on Monday Wednesday Friday   ezetimibe   10 mg Oral Q24H   heparin  injection (subcutaneous)  5,000 Units Subcutaneous Q8H   midodrine   5 mg Oral TID WC    nicotine   21 mg Transdermal Q24H   pantoprazole   40 mg Oral Daily   sevelamer  carbonate  2,400 mg Oral TID WC   vitamin E   200 Units Oral Daily    Dialysis Orders:  [facility] MWF 1st shift 3hr30 2/2 180NR EDW 90kg (has been reaching and sometimes below) Mirc 0, Hect4  Assessment/Plan: Dialysis access - Concern for catheter related infection. Complicated case -Do not want her to end up losing the access site ([facility]) as per VIR unable to access to right side. Patient refusing a fem catheter and also permanent access. Plan was for lateralization of the tunnel with new exit site. However found to have infected tunneled site on IR exam so temp cath placed on 2/2.  Initial BCx negative. Cath tip cx ng to date. WBC 10.5. On IV Vanc -->Will consult IR for new tunneled line for 2/5.  ESRD. HD MWF. Continue on schedule. HD today.  Hypotension/volume  - BP in goal. Does not appear volume overloaded.  Under dry weight as mentioned above.   Anemia of CKD - Hgb 13.3. No indication for ESA/iron  therapy with active infection; last ESA was given 09/13/24.  Secondary Hyperparathyroidism -  Ca in goal. Phos 4.8. Continue VDRA and binders.  Nutrition - Renal diet w/fluid restrictions when advanced.  H/o COPD PAD s/p bypass Prior DVT/PE on Eliquis    Whitney Crusoe Ronnald Acosta PA-C [facility] 02/02/2025,1:32 PM   "

## 2025-02-02 NOTE — Progress Notes (Signed)
 0.42ml of 2.5ml Alteplase  removed from trialysis catheter. Pt continues to complain of irritation at trialysis catheter dressing site. Dead end cap remains intact. Primary nurse aware.

## 2025-02-02 NOTE — Progress Notes (Signed)
" °   02/02/25 1527  Vitals  Temp 97.6 F (36.4 C)  Pulse Rate (!) 105  Resp 18  BP 120/64  SpO2 98 %  O2 Device Room Air  Weight 91.8 kg (Standing Scale)  Type of Weight Post-Dialysis  Oxygen  Therapy  Patient Activity (if Appropriate) In bed  Pulse Oximetry Type Continuous  During Treatment Monitoring  HD Safety Checks Performed Yes  Intra-Hemodialysis Comments  (Pt. HD line would not run, patient started to get nauseous and complaint about chest pain and the PA was at the Bedside to check her out. Patient eventually calm down. Patient UF remain off during the treatment. Report call to 5W Bedside RN)   Received patient in bed to unit.  Alert and oriented.  Informed consent signed and in chart.   TX duration: 2.45  Patient tolerated well.  Transported back to the room  Alert, without acute distress.  Hand-off given to patient's nurse.   Access used: Yes Access issues: Yes, AP was increasing and the line was reversed and flush with NS and still no prevail with the HD catheter. PA Ejigiri was at the bedside and request me to take the patient off the machine.  Total UF removed: -0.4 Medication(s) given: See MAR Post HD VS: See Above Grid Post HD weight: 91.8 kg   Zebedee DELENA [facility] "

## 2025-02-02 NOTE — Progress Notes (Signed)
 "                        PROGRESS NOTE        PATIENT DETAILS Name: Whitney Walker Age: 69 y.o. Sex: female Date of Birth: May 08, 1956 Admit Date: 01/26/2025 Admitting Physician Lenny Drought, MD ERE:Fnnmz, Gaines, FNP  Brief Summary: Patient is a 69 y.o.  female with history of ESRD on HD MWF, chronic hypotension on midodrine , VTE on Eliquis -presented to the ED on 1/28 from her dialysis center for hypotension-unknown responsive to IVF-started on pressors-concern for sepsis of unknown etiology-subsequently admitted to ICU.  Stabilized and transferred to [facility] on 1/30.  Upon further evaluation-found to have a infected pocket/tunneled dialysis catheter site.  Significant events: 1/28>> admit to [facility] 1/30>> transferred to [facility]-pus expressed from tunneled HD catheter site-after discussion with renal-IR consulted.  Tentative plans to lateralize HD catheter due to concern of pocket infection.  Lateralization scheduled for 2/2 after Eliquis  washout. 2/02>> found to have pus/mucus-at tunneled HD catheter site-lateralization not possible due to infected tunneled-temporary dialysis catheter placed.  Patient not keen on having femoral tunneled catheter-has has had prior accesses on the right side.    Significant studies: 1/28>> CTA chest: No PE-no PNA 1/28>> CT abdomen/pelvis: Stable postsurgical changes-subtotal colectomy with ileorectal anastomosis.  Significant microbiology data: 1/28>> COVID/influenza/RSV PCR: Negative 1/28>> blood culture: No growth 2/02>> HD cath tip: No growth.  Procedures: 2/02>> fluoro-guided conversion to temporary left IJ HD catheter.  Consults: PCCM Nephrology IR  Subjective: Denies any acute complaint.  No nausea no vomiting no fever no chills.  Objective: Vitals: Blood pressure (!) 157/106, pulse 98, temperature 98 F (36.7 C), temperature source Oral, resp. rate 18, height 5' 2 (1.575 m), weight 91.8 kg, SpO2 98%.   Exam: Clear to  auscultation. Trace lower extremity edema but Bowel sound present but No abdominal tenderness. Aortic systolic murmur heard.  Pertinent Labs/Radiology:    Latest Ref Rng & Units 02/02/2025    8:46 AM 01/29/2025    4:56 AM 01/27/2025    2:08 AM  CBC  WBC 4.0 - 10.5 K/uL 10.5  10.1  11.2   Hemoglobin 12.0 - 15.0 g/dL 86.6  86.6  85.2   Hematocrit 36.0 - 46.0 % 42.4  40.1  48.5   Platelets 150 - 400 K/uL 171  149  150     Lab Results  Component Value Date   NA 132 (L) 02/02/2025   K 5.6 (H) 02/02/2025   CL 97 (L) 02/02/2025   CO2 21 (L) 02/02/2025      Assessment/Plan: Septic shock-2/2 Tunneled catheter site/pocket infection Sepsis physiology has resolved-briefly required Levophed  while in the ICU Blood cultures are negative so far Did have some purulence from catheter site on 1/30-but that has since resolved for the past several days-unfortunately-TDC was not able to be lateralized on 2/2-infected [facility] was removed and a  temporary dialysis catheter was placed. Patient is not keen on having femoral TDC-but she understands that this may not be an option-as she has had numerous issues with access on her right side of her chest in the past.  Await further recommendations from nephrology service. In the interim-continue with vancomycin .  ESRD on HD MWF Defer dialysis to nephrology service  Chronic hypotension Midodrine -BP has stabilized.  Hyponatremia Likely secondary to free water excretion in the setting of ESRD Improved with further HD.  History of VTE Eliquis  held for lateralization of TDC-continue to hold as patient likely will  require a more permanent access-await nephrology input.    History of PAD Stable-chronic issue Outpatient follow-up with vascular surgery  HLD Statin/Zetia   COPD Stable Bronchodilators  OSA Intolerant to CPAP prior notes  6 mm nodule RLL/3 mm left upper lobe nodule Repeat CT chest in 3 to 6 months.  Class 2 Obesity: Estimated body  mass index is 37.02 kg/m as calculated from the following:   Height as of this encounter: 5' 2 (1.575 m).   Weight as of this encounter: 91.8 kg.   Code status:   Code Status: Full Code   DVT Prophylaxis: heparin  injection 5,000 Units Start: 01/29/25 0745 SCDs Start: 01/26/25 1242     Family Communication: No one at bedside.   Disposition Plan: Status is: Inpatient Remains inpatient appropriate because: Severity of illness   Planned Discharge Destination:Home   Diet: Diet Order             Diet NPO time specified Except for: Sips with Meds  Diet effective midnight           Diet regular Room service appropriate? Yes; Fluid consistency: Thin  Diet effective now                     Antimicrobial agents: Anti-infectives (From admission, onward)    Start     Dose/Rate Route Frequency Ordered Stop   02/03/25 0800  ceFAZolin  (ANCEF ) IVPB 2g/100 mL premix        2 g 200 mL/hr over 30 Minutes Intravenous To Radiology 02/02/25 1533 02/04/25 0800   01/31/25 1624  ceFAZolin  (ANCEF ) IVPB 2g/100 mL premix        over 30 Minutes Intravenous Continuous PRN 01/31/25 1625 01/31/25 1624   01/28/25 1200  vancomycin  (VANCOCIN ) IVPB 1000 mg/200 mL premix        1,000 mg 200 mL/hr over 60 Minutes Intravenous Every M-W-F (Hemodialysis) 01/26/25 1353     01/27/25 1700  vancomycin  (VANCOCIN ) IVPB 1000 mg/200 mL premix  Status:  Discontinued        1,000 mg 200 mL/hr over 60 Minutes Intravenous  Once 01/27/25 1603 01/28/25 0756   01/26/25 1700  ceFEPIme  (MAXIPIME ) 1 g in sodium chloride  0.9 % 100 mL IVPB  Status:  Discontinued        1 g 200 mL/hr over 30 Minutes Intravenous Every 24 hours 01/26/25 1353 01/28/25 1057   01/26/25 0915  piperacillin -tazobactam (ZOSYN ) IVPB 3.375 g        3.375 g 100 mL/hr over 30 Minutes Intravenous  Once 01/26/25 0912 01/26/25 1010   01/26/25 0915  vancomycin  (VANCOREADY) IVPB 2000 mg/400 mL        2,000 mg 200 mL/hr over 120 Minutes Intravenous   Once 01/26/25 0912 01/26/25 1238        MEDICATIONS: Scheduled Meds:  vitamin C   250 mg Oral Daily   atorvastatin   20 mg Oral Q24H   Chlorhexidine  Gluconate Cloth  6 each Topical Q0600   doxercalciferol   4 mcg Oral Once per day on Monday Wednesday Friday   ezetimibe   10 mg Oral Q24H   heparin  injection (subcutaneous)  5,000 Units Subcutaneous Q8H   midodrine   5 mg Oral TID WC   nicotine   21 mg Transdermal Q24H   pantoprazole   40 mg Oral Daily   sevelamer  carbonate  2,400 mg Oral TID WC   vitamin E   200 Units Oral Daily   Continuous Infusions:  [START ON 02/03/2025]  ceFAZolin  (ANCEF ) IV  vancomycin  Stopped (01/31/25 1327)   PRN Meds:.acetaminophen , albuterol , meclizine , mouth rinse, traZODone    I have personally reviewed following labs and imaging studies  LABORATORY DATA: CBC: Recent Labs  Lab 01/27/25 0208 01/29/25 0456 02/02/25 0846  WBC 11.2* 10.1 10.5  HGB 14.7 13.3 13.3  HCT 48.5* 40.1 42.4  MCV 93.0 88.9 93.0  PLT 150 149* 171    Basic Metabolic Panel: Recent Labs  Lab 01/27/25 0208 01/27/25 0614 01/29/25 0456 02/02/25 0846  NA 128* 129* 132* 132*  K 6.1* 5.4* 4.3 5.6*  CL 95* 95* 94* 97*  CO2 13* 15* 22 21*  GLUCOSE 123* 124* 99 139*  BUN 46* 48* 30* 40*  CREATININE 11.40* 11.40* 8.56* 10.20*  CALCIUM  8.1* 8.4* 8.8* 9.3  MG 2.2  --   --   --   PHOS 6.5*  --  4.8* 4.9*    GFR: Estimated Creatinine Clearance: 5.5 mL/min (A) (by C-G formula based on SCr of 10.2 mg/dL (H)).  Liver Function Tests: Recent Labs  Lab 01/29/25 0456 02/02/25 0846  ALBUMIN  3.4* 3.5   No results for input(s): LIPASE, AMYLASE in the last 168 hours.  No results for input(s): AMMONIA in the last 168 hours.  Coagulation Profile: No results for input(s): INR, PROTIME in the last 168 hours.  Cardiac Enzymes: No results for input(s): CKTOTAL, CKMB, CKMBINDEX, TROPONINI in the last 168 hours.  BNP (last 3 results) No results for input(s):  PROBNP in the last 8760 hours.  Lipid Profile: No results for input(s): CHOL, HDL, LDLCALC, TRIG, CHOLHDL, LDLDIRECT in the last 72 hours.  Thyroid  Function Tests: No results for input(s): TSH, T4TOTAL, FREET4, T3FREE, THYROIDAB in the last 72 hours.  Anemia Panel: No results for input(s): VITAMINB12, FOLATE, FERRITIN, TIBC, IRON , RETICCTPCT in the last 72 hours.  Urine analysis:    Component Value Date/Time   COLORURINE YELLOW 03/02/2019 1416   APPEARANCEUR CLEAR 03/02/2019 1416   LABSPEC 1.010 03/02/2019 1416   PHURINE 6.0 03/02/2019 1416   GLUCOSEU 50 (A) 03/02/2019 1416   HGBUR SMALL (A) 03/02/2019 1416   BILIRUBINUR NEGATIVE 03/02/2019 1416   KETONESUR 5 (A) 03/02/2019 1416   PROTEINUR 100 (A) 03/02/2019 1416   UROBILINOGEN 0.2 10/19/2015 1532   NITRITE NEGATIVE 03/02/2019 1416   LEUKOCYTESUR NEGATIVE 03/02/2019 1416    Sepsis Labs: Lactic Acid, Venous    Component Value Date/Time   LATICACIDVEN 1.8 01/26/2025 1437    MICROBIOLOGY: Recent Results (from the past 240 hours)  Blood culture (routine x 2)     Status: None   Collection Time: 01/26/25  9:08 AM   Specimen: BLOOD  Result Value Ref Range Status   Specimen Description BLOOD SITE NOT SPECIFIED  Final   Special Requests   Final    BOTTLES DRAWN AEROBIC AND ANAEROBIC Blood Culture results may not be optimal due to an inadequate volume of blood received in culture bottles   Culture   Final    NO GROWTH 5 DAYS Performed at [facility], [address] [place], [place] [address]    Report Status 01/31/2025 FINAL  Final  Resp panel by RT-PCR (RSV, Flu A&B, Covid) Anterior Nasal Swab     Status: None   Collection Time: 01/26/25  9:08 AM   Specimen: Anterior Nasal Swab  Result Value Ref Range Status   SARS Coronavirus 2 by RT PCR NEGATIVE NEGATIVE Final   Influenza A by PCR NEGATIVE NEGATIVE Final   Influenza B by PCR NEGATIVE NEGATIVE Final  Comment: (NOTE) The  Xpert Xpress SARS-CoV-2/FLU/RSV plus assay is intended as an aid in the diagnosis of influenza from Nasopharyngeal swab specimens and should not be used as a sole basis for treatment. Nasal washings and aspirates are unacceptable for Xpert Xpress SARS-CoV-2/FLU/RSV testing.  Fact Sheet for Patients: bloggercourse.com  Fact Sheet for Healthcare Providers: seriousbroker.it  This test is not yet approved or cleared by the [place]  FDA and has been authorized for detection and/or diagnosis of SARS-CoV-2 by FDA under an Emergency Use Authorization (EUA). This EUA will remain in effect (meaning this test can be used) for the duration of the COVID-19 declaration under Section 564(b)(1) of the Act, 21 U.S.C. section 360bbb-3(b)(1), unless the authorization is terminated or revoked.     Resp Syncytial Virus by PCR NEGATIVE NEGATIVE Final    Comment: (NOTE) Fact Sheet for Patients: bloggercourse.com  Fact Sheet for Healthcare Providers: seriousbroker.it  This test is not yet approved or cleared by the [place]  FDA and has been authorized for detection and/or diagnosis of SARS-CoV-2 by FDA under an Emergency Use Authorization (EUA). This EUA will remain in effect (meaning this test can be used) for the duration of the COVID-19 declaration under Section 564(b)(1) of the Act, 21 U.S.C. section 360bbb-3(b)(1), unless the authorization is terminated or revoked.  Performed at [facility], [address], [place], [place] [address]   Blood culture (routine x 2)     Status: None   Collection Time: 01/26/25  9:13 AM   Specimen: Site Not Specified; Blood  Result Value Ref Range Status   Specimen Description SITE NOT SPECIFIED  Final   Special Requests   Final    BOTTLES DRAWN AEROBIC AND ANAEROBIC Blood Culture adequate volume   Culture   Final    NO GROWTH 5 DAYS Performed  at [facility], [address] [place], [place] [address]    Report Status 01/31/2025 FINAL  Final  MRSA Next Gen by PCR, Nasal     Status: None   Collection Time: 01/26/25 12:42 PM   Specimen: Nasal Mucosa; Nasal Swab  Result Value Ref Range Status   MRSA by PCR Next Gen NOT DETECTED NOT DETECTED Final    Comment: (NOTE) The GeneXpert MRSA Assay (FDA approved for NASAL specimens only), is one component of a comprehensive MRSA colonization surveillance program. It is not intended to diagnose MRSA infection nor to guide or monitor treatment for MRSA infections. Test performance is not FDA approved in patients less than 65 years old. Performed at [facility], [address], [place], [place] [address]   Cath Tip Culture     Status: None (Preliminary result)   Collection Time: 01/31/25  5:35 PM   Specimen: Catheter Tip; Tissue  Result Value Ref Range Status   Specimen Description CATH TIP  Final   Special Requests NONE  Final   Culture   Final    NO GROWTH 2 DAYS Performed at [facility], [address] [place], [place] [address]    Report Status PENDING  Incomplete    RADIOLOGY STUDIES/RESULTS: DG CHEST PORT 1 VIEW Result Date: 02/02/2025 CLINICAL DATA:  Central line care. EXAM: PORTABLE CHEST 1 VIEW COMPARISON:  01/27/2025 FINDINGS: Left-sided dialysis catheter tip overlies the lower SVC. No pneumothorax. Mild cardiomegaly stable. Mediastinal contours are unchanged. Hazy opacity at the left lung base may represent pleural effusion or soft tissue attenuation. No confluent opacity. Slight vascular congestion. IMPRESSION: 1. Left-sided dialysis  catheter tip overlies the lower SVC. No pneumothorax. 2. Hazy opacity at the left lung base may represent pleural effusion or soft tissue attenuation. Electronically Signed   By: Andrea Gasman M.D.   On: 02/02/2025 12:04      LOS: 7 days   Yetta Blanch, MD  [facility]    To contact the attending provider  between 7A-7P or the covering provider during after hours 7P-7A, please log into the web site [email or web address] and access using universal [place] password for that web site. If you do not have the password, please call the hospital operator.  02/02/2025, 5:32 PM    "

## 2025-02-02 NOTE — Progress Notes (Signed)
 Pharmacy Antibiotic Note  Whitney Walker is a 69 y.o. female admitted on 01/26/2025 with bacteremia.  Pharmacy has been consulted for vancomycin  dosing.  Suspected for cath infection and it has been removed. TDC placed 2/2. Cultures neg to date.   Plan: Vancomycin  1000mg  IV with HD (MWF) Follow up HD plans and any changes for vancomycin  dosing  Height: 5' 2 (157.5 cm) Weight: 90.2 kg (198 lb 13.7 oz) IBW/kg (Calculated) : 50.1  Temp (24hrs), Avg:97.6 F (36.4 C), Min:97.2 F (36.2 C), Max:98.1 F (36.7 C)  Recent Labs  Lab 01/26/25 0931 01/26/25 0941 01/26/25 0942 01/26/25 1436 01/26/25 1437 01/27/25 0208 01/27/25 0614 01/29/25 0456  WBC 11.6*  --   --   --   --  11.2*  --  10.1  CREATININE  --  9.50*  --  9.50*  --  11.40* 11.40* 8.56*  LATICACIDVEN  --   --  2.4*  --  1.8  --   --   --     Estimated Creatinine Clearance: 6.5 mL/min (A) (by C-G formula based on SCr of 8.56 mg/dL (H)).    Allergies[1]  Antimicrobials this admission: Cefepime  1/28 >>1/30 Vanco 1/28 >> Zosyn  x 1 1/28  Microbiology results: 1/28 BCx: ngF 1/28 RVP negative 1/28 MRSA PCR neg 2/2 cath tip>>ngtd  Sergio Batch, PharmD, BCIDP, AAHIVP, CPP Infectious Disease Pharmacist 02/02/2025 8:43 AM        [1]  Allergies Allergen Reactions   Difelikefalin Itching   Clindamycin /Lincomycin Diarrhea and Itching   Gadolinium Derivatives Hives and Other (See Comments)    HIVES, Desc: HIVES W/ DYE USED FOR 1ST CT SCAN BUT NOT 2ND, NO PREMEDS USED, PT UNCERTAIN OF CIRCUMSTANCES,,?POSSIBLE MRI CONTRAST ALLERGY, ALL STUDIES DONE SOMEWHERE IN [place] //A.C[facility] Onset Date: [id number]   Chlorhexidine  Dermatitis    NO CHG for IV catheter dressing changes, CHG bath wipes ok   Wound Dressing Adhesive Itching

## 2025-02-02 NOTE — Care Management Important Message (Signed)
 Important Message  Patient Details  Name: Whitney Walker MRN: [id number] Date of Birth: 28-Nov-1956   Important Message Given:  Yes - Medicare IM     Claretta Deed 02/02/2025, 4:12 PM

## 2025-02-03 LAB — CBC
HCT: 37.6 % (ref 36.0–46.0)
Hemoglobin: 11.7 g/dL — ABNORMAL LOW (ref 12.0–15.0)
MCH: 29 pg (ref 26.0–34.0)
MCHC: 31.1 g/dL (ref 30.0–36.0)
MCV: 93.3 fL (ref 80.0–100.0)
Platelets: 161 10*3/uL (ref 150–400)
RBC: 4.03 MIL/uL (ref 3.87–5.11)
RDW: 15.2 % (ref 11.5–15.5)
WBC: 10.1 10*3/uL (ref 4.0–10.5)
nRBC: 0 % (ref 0.0–0.2)

## 2025-02-03 LAB — RENAL FUNCTION PANEL
Albumin: 3.1 g/dL — ABNORMAL LOW (ref 3.5–5.0)
Anion gap: 14 (ref 5–15)
BUN: 39 mg/dL — ABNORMAL HIGH (ref 8–23)
CO2: 20 mmol/L — ABNORMAL LOW (ref 22–32)
Calcium: 8.9 mg/dL (ref 8.9–10.3)
Chloride: 101 mmol/L (ref 98–111)
Creatinine, Ser: 10.3 mg/dL — ABNORMAL HIGH (ref 0.44–1.00)
GFR, Estimated: 4 mL/min — ABNORMAL LOW
Glucose, Bld: 108 mg/dL — ABNORMAL HIGH (ref 70–99)
Phosphorus: 5.5 mg/dL — ABNORMAL HIGH (ref 2.5–4.6)
Potassium: 5.6 mmol/L — ABNORMAL HIGH (ref 3.5–5.1)
Sodium: 134 mmol/L — ABNORMAL LOW (ref 135–145)

## 2025-02-03 LAB — CATH TIP CULTURE: Culture: NO GROWTH

## 2025-02-03 LAB — MAGNESIUM: Magnesium: 1.9 mg/dL (ref 1.7–2.4)

## 2025-02-03 MED ORDER — SODIUM ZIRCONIUM CYCLOSILICATE 10 G PO PACK
10.0000 g | PACK | Freq: Every day | ORAL | Status: AC
  Administered 2025-02-03: 10 g via ORAL
  Filled 2025-02-03: qty 1

## 2025-02-03 NOTE — Plan of Care (Signed)
  Problem: Education: Goal: Knowledge of General Education information will improve Description: Including pain rating scale, medication(s)/side effects and non-pharmacologic comfort measures Outcome: Progressing   Problem: Clinical Measurements: Goal: Ability to maintain clinical measurements within normal limits will improve Outcome: Progressing   Problem: Clinical Measurements: Goal: Diagnostic test results will improve Outcome: Progressing   Problem: Clinical Measurements: Goal: Respiratory complications will improve Outcome: Progressing   Problem: Activity: Goal: Risk for activity intolerance will decrease Outcome: Progressing

## 2025-02-03 NOTE — Progress Notes (Signed)
 [facility] Progress Note Patient: Whitney Walker FMW:[id number] DOB: [birth year]  DOA: 01/26/2025 DOS: the patient was seen and examined on 02/03/2025  Brief Summary: Patient is a 69 y.o.  female with history of ESRD on HD MWF, chronic hypotension on midodrine , VTE on Eliquis -presented to the ED on 1/28 from her dialysis center for hypotension-unknown responsive to IVF-started on pressors-concern for sepsis of unknown etiology-subsequently admitted to ICU.  Stabilized and transferred to [facility] on 1/30.  Upon further evaluation-found to have a infected pocket/tunneled dialysis catheter site.   Significant events: 1/28>> admit to [facility] 1/30>> transferred to [facility]-pus expressed from tunneled HD catheter site-after discussion with renal-IR consulted.  Tentative plans to lateralize HD catheter due to concern of pocket infection.  Lateralization scheduled for 2/2 after Eliquis  washout. 2/02>> found to have pus/mucus-at tunneled HD catheter site-lateralization not possible due to infected tunneled-temporary dialysis catheter placed.  Patient not keen on having femoral tunneled catheter-has has had prior accesses on the right side.   Significant studies: 1/28>> CTA chest: No PE-no PNA 1/28>> CT abdomen/pelvis: Stable postsurgical changes-subtotal colectomy with ileorectal anastomosis.   Significant microbiology data: 1/28>> COVID/influenza/RSV PCR: Negative 1/28>> blood culture: No growth 2/02>> HD cath tip: No growth.   Procedures: 2/02>> fluoro-guided conversion to temporary left IJ HD catheter.   Consults: PCCM Nephrology IR  Assessment and plan: Septic shock-2/2 Tunneled catheter site/pocket infection Sepsis physiology has resolved-briefly required Levophed  while in the ICU Blood cultures are negative so far Did have some purulence from catheter site on 1/30-but that has since resolved for the past several days-unfortunately-TDC was not able to be lateralized on 2/2-infected [facility] was  removed and a  temporary dialysis catheter was placed. Patient is not keen on having femoral TDC-but she understands that this may not be an option-as she has had numerous issues with access on her right side of her chest in the past.  Await further recommendations from nephrology service. In the interim-continue with vancomycin .   ESRD on HD MWF Defer dialysis to nephrology service   Chronic hypotension Midodrine -BP has stabilized.   Hyponatremia Likely secondary to free water excretion in the setting of ESRD Improved with further HD.   History of VTE Eliquis  held for lateralization of TDC-continue to hold as patient likely will require a more permanent access-await nephrology input.     History of PAD Stable-chronic issue Outpatient follow-up with vascular surgery   HLD Statin/Zetia    COPD Stable Bronchodilators   OSA Intolerant to CPAP prior notes   6 mm nodule RLL/3 mm left upper lobe nodule Repeat CT chest in 3 to 6 months.   Class 2 Obesity: Body mass index is 37.02 kg/m.  Placing the patient at a high risk for poor outcome.    Code Status: Full Code   DVT Prophylaxis: heparin  injection 5,000 Units Start: 01/29/25 0745 SCDs Start: 01/26/25 1242   Data review I have Reviewed nursing notes, Vitals, and Lab results. Since last encounter, pertinent lab results CBC and BMP   . I have ordered test including CBC and BMP  . I have discussed pt's care plan and test results with nephrology and IR  .   Physical exam. Vitals:   02/03/25 0747 02/03/25 0753 02/03/25 0940 02/03/25 1545  BP: (!) 192/125  (!) 139/49 (!) 151/110  Pulse: 91   (!) 104  Resp: 17   17  Temp: 97.9 F (36.6 C) 97.9 F (36.6 C)  98 F (36.7 C)  TempSrc: Oral Oral  Oral  SpO2: 96%     Weight:      Height:      Clear to auscultation. S1-S2 present. Bowel sound present Trace edema of lower extremity. Subjective: Frustrated.  Wants to go to under general anesthesia for her catheter  placement.  No nausea or vomiting.  Family Communication: Daughter on the phone.  Disposition Plan: Status is: Inpatient Remains inpatient appropriate because: Awaiting exchange of the [facility].   Planned Discharge Destination: Home Diet: Diet Order             Diet NPO time specified Except for: Ice Chips, Sips with Meds  Diet effective midnight           Diet renal with fluid restriction Fluid restriction: 1200 mL Fluid; Room service appropriate? Yes; Fluid consistency: Thin  Diet effective now                   MEDICATIONS: Scheduled Meds:  vitamin C   250 mg Oral Daily   atorvastatin   20 mg Oral Q24H   Chlorhexidine  Gluconate Cloth  6 each Topical Q0600   doxercalciferol   4 mcg Oral Once per day on Monday Wednesday Friday   ezetimibe   10 mg Oral Q24H   heparin  injection (subcutaneous)  5,000 Units Subcutaneous Q8H   midodrine   5 mg Oral TID WC   nicotine   21 mg Transdermal Q24H   pantoprazole   40 mg Oral Daily   sevelamer  carbonate  2,400 mg Oral TID WC   sodium zirconium cyclosilicate   10 g Oral Daily   vitamin E   200 Units Oral Daily   Continuous Infusions:  vancomycin  Stopped (01/31/25 1327)   PRN Meds:.acetaminophen , albuterol , meclizine , mouth rinse, traZODone   Author: Yetta Blanch, MD  [facility] 02/03/2025  7:38 PM Between 7PM-7AM, please contact night-coverage, check [email or web address] for on call.

## 2025-02-03 NOTE — Progress Notes (Signed)
" ° ° °  PROCEDURAL EXPEDITER PROGRESS NOTE  Patient Name: Whitney Walker  DOB:04-05-1956 Date of Admission: 01/26/2025  Date of Assessment:02/03/25   -------------------------------------------------------------------------------------------------------------------   Brief clinical summary: going to OR for dialysis catheter on 02-05-24.  Orders in place:   yes   Communication with surgical team if no orders: none  Labs, test, and orders reviewed: yes  Requires surgical clearance:   No  Barriers noted: none   Intervention provided by [facility] team: n/a  Barrier resolved:   not applicable   -------------------------------------------------------------------------------------------------------------------  [facility] Patient Care Command Expediter, Whitney Walker Please contact us  directly via secure chat (search for [facility]) or by calling us  at [phone] [facility]).  "

## 2025-02-03 NOTE — Progress Notes (Signed)
 " [facility] Progress Note   Subjective:    Seen and examined patient at bedside. She expressed frustration on current HD catheter issues. Noted dialysis was aborted yesterday because current HD temp catheter wasn't working. Plan for IF to transition current temp catheter to a TDC sometime today. I reached out to the IR PA to determine procedure time. She's currently c/o pain and burning at HD temp catheter area.  Objective Vitals:   02/03/25 0000 02/03/25 0455 02/03/25 0747 02/03/25 0753  BP: (!) 100/50 113/64 (!) 192/125   Pulse: 73 73 91   Resp: 19 19 17    Temp: (!) 97.5 F (36.4 C) (!) 97.2 F (36.2 C) 97.9 F (36.6 C) 97.9 F (36.6 C)  TempSrc: Oral Oral Oral Oral  SpO2:      Weight:      Height:       Physical Exam General: Alert, nad Heart: RRR Lungs: Clear, normal wob Abdomen: non-tender Extremities: no LE edema  Dialysis Access: L neck temp cath   [facility] Weights   01/31/25 1326 02/02/25 1335 02/02/25 1527  Weight: 90.2 kg (S) 91.6 kg 91.8 kg   No intake or output data in the 24 hours ending 02/03/25 1214  Additional Objective Labs: Basic Metabolic Panel: Recent Labs  Lab 01/29/25 0456 02/02/25 0846 02/03/25 0310  NA 132* 132* 134*  K 4.3 5.6* 5.6*  CL 94* 97* 101  CO2 22 21* 20*  GLUCOSE 99 139* 108*  BUN 30* 40* 39*  CREATININE 8.56* 10.20* 10.30*  CALCIUM  8.8* 9.3 8.9  PHOS 4.8* 4.9* 5.5*   Liver Function Tests: Recent Labs  Lab 01/29/25 0456 02/02/25 0846 02/03/25 0310  ALBUMIN  3.4* 3.5 3.1*   No results for input(s): LIPASE, AMYLASE in the last 168 hours. CBC: Recent Labs  Lab 01/29/25 0456 02/02/25 0846 02/03/25 0310  WBC 10.1 10.5 10.1  HGB 13.3 13.3 11.7*  HCT 40.1 42.4 37.6  MCV 88.9 93.0 93.3  PLT 149* 171 161   Blood Culture    Component Value Date/Time   SDES CATH TIP 01/31/2025 1735   SPECREQUEST NONE 01/31/2025 1735   CULT  01/31/2025 1735    NO GROWTH 3 DAYS Performed at [facility]  Lab, [address], [place], [place] [address]    REPTSTATUS 02/03/2025 FINAL 01/31/2025 1735    Cardiac Enzymes: No results for input(s): CKTOTAL, CKMB, CKMBINDEX, TROPONINI in the last 168 hours. CBG: No results for input(s): GLUCAP in the last 168 hours. Iron  Studies: No results for input(s): IRON , TIBC, TRANSFERRIN, FERRITIN in the last 72 hours. Lab Results  Component Value Date   INR 1.2 05/16/2022   INR 1.3 (H) 07/16/2021   INR 1.2 05/28/2021   Studies/Results: DG CHEST PORT 1 VIEW Result Date: 02/02/2025 CLINICAL DATA:  Central line care. EXAM: PORTABLE CHEST 1 VIEW COMPARISON:  01/27/2025 FINDINGS: Left-sided dialysis catheter tip overlies the lower SVC. No pneumothorax. Mild cardiomegaly stable. Mediastinal contours are unchanged. Hazy opacity at the left lung base may represent pleural effusion or soft tissue attenuation. No confluent opacity. Slight vascular congestion. IMPRESSION: 1. Left-sided dialysis catheter tip overlies the lower SVC. No pneumothorax. 2. Hazy opacity at the left lung base may represent pleural effusion or soft tissue attenuation. Electronically Signed   By: Andrea Gasman M.D.   On: 02/02/2025 12:04    Medications:  vancomycin  Stopped (01/31/25 1327)    vitamin C   250 mg Oral Daily   atorvastatin   20 mg Oral Q24H  Chlorhexidine  Gluconate Cloth  6 each Topical Q0600   doxercalciferol   4 mcg Oral Once per day on Monday Wednesday Friday   ezetimibe   10 mg Oral Q24H   heparin  injection (subcutaneous)  5,000 Units Subcutaneous Q8H   midodrine   5 mg Oral TID WC   nicotine   21 mg Transdermal Q24H   pantoprazole   40 mg Oral Daily   sevelamer  carbonate  2,400 mg Oral TID WC   sodium zirconium cyclosilicate   10 g Oral Daily   vitamin E   200 Units Oral Daily    Dialysis Orders: [facility] MWF 1st shift 3hr30 2/2 180NR EDW 90kg (has been reaching and sometimes below) Mirc 0, Hect4  Assessment/Plan: Dialysis access - Concern for catheter  related infection. Complicated case -Do not want her to end up losing the access site ([facility]) as per VIR unable to access to right side. Patient refusing a fem catheter and also permanent access. Plan was for lateralization of the tunnel with new exit site. However found to have infected tunneled site on IR exam so temp cath placed on 2/2.  Initial BCx negative. Cath tip cx ng to date. WBC 10.5. IR has been consulted and now plan to transition to new [facility] sometime today.  ESRD. HD MWF. Now off schedule. Wasn't able to dialyze her yesterday because temp HD catheter wasn't working. Plan for new [facility] placement by IR sometime today. She will then have dialysis afterwards either this evening or tomorrow morning. Hypotension/volume  - BP in goal. Does not appear volume overloaded.  Under dry weight as mentioned above.   Anemia of CKD - Hgb 13.3. No indication for ESA/iron  therapy with active infection; last ESA was given 09/13/24.  Secondary Hyperparathyroidism -  Ca in goal. Phos 4.8. Continue VDRA and binders.  Nutrition - Renal diet w/fluid restrictions when advanced.  H/o COPD PAD s/p bypass Prior DVT/PE on Eliquis   Charmaine Piety, NP [facility] 02/03/2025,12:14 PM  LOS: 8 days    "

## 2025-02-03 NOTE — TOC Progression Note (Signed)
 Transition of Care [facility]) - Progression Note    Patient Details  Name: Whitney Walker MRN: [id number] Date of Birth: 1956-01-23  Transition of Care [facility]) CM/SW Contact  Landry DELENA Senters, RN Phone Number: 02/03/2025, 12:14 PM  Clinical Narrative:    Continued medical workup. Plan for IR today, patient continues to have complications with HD access.  CM will continue to follow.   Expected Discharge Plan:  (TBD) Barriers to Discharge: Continued Medical Work up               Expected Discharge Plan and Services       Living arrangements for the past 2 months: Single Family Home                                       Social Drivers of Health (SDOH) Interventions SDOH Screenings   Food Insecurity: No Food Insecurity (01/27/2025)  Housing: Low Risk (01/27/2025)  Transportation Needs: No Transportation Needs (01/27/2025)  Utilities: Not At Risk (01/27/2025)  Alcohol Screen: Low Risk (03/30/2024)  Depression (PHQ2-9): Low Risk (06/22/2024)  Financial Resource Strain: Low Risk (03/30/2024)  Physical Activity: Insufficiently Active (05/06/2024)  Social Connections: Moderately Isolated (01/27/2025)  Stress: No Stress Concern Present (03/30/2024)  Tobacco Use: High Risk (01/26/2025)  Health Literacy: Adequate Health Literacy (05/06/2024)    Readmission Risk Interventions    05/20/2024    2:36 PM 05/21/2022    4:20 PM  Readmission Risk Prevention Plan  Transportation Screening Complete Complete  PCP or Specialist Appt within 3-5 Days Complete   HRI or Home Care Consult Complete   Social Work Consult for Recovery Care Planning/Counseling Complete   Palliative Care Screening Not Applicable   Medication Review Ocean[facility]) Referral to Pharmacy Referral to Pharmacy  HRI or Home Care Consult  Complete  SW Recovery Care/Counseling Consult  Complete  Palliative Care Screening  Not Applicable  Skilled Nursing Facility  Not Applicable

## 2025-02-03 NOTE — Plan of Care (Signed)
°  Patient is progressing towards goals of care    Problem: Education: Goal: Knowledge of General Education information will improve Description: Including pain rating scale, medication(s)/side effects and non-pharmacologic comfort measures Outcome: Progressing   Problem: Health Behavior/Discharge Planning: Goal: Ability to manage health-related needs will improve Outcome: Progressing   Problem: Clinical Measurements: Goal: Ability to maintain clinical measurements within normal limits will improve Outcome: Progressing Goal: Will remain free from infection Outcome: Progressing Goal: Diagnostic test results will improve Outcome: Progressing Goal: Respiratory complications will improve Outcome: Progressing Goal: Cardiovascular complication will be avoided Outcome: Progressing   Problem: Activity: Goal: Risk for activity intolerance will decrease Outcome: Progressing   Problem: Nutrition: Goal: Adequate nutrition will be maintained Outcome: Progressing   Problem: Coping: Goal: Level of anxiety will decrease Outcome: Progressing   Problem: Elimination: Goal: Will not experience complications related to bowel motility Outcome: Progressing Goal: Will not experience complications related to urinary retention Outcome: Progressing   Problem: Pain Managment: Goal: General experience of comfort will improve and/or be controlled Outcome: Progressing   Problem: Safety: Goal: Ability to remain free from injury will improve Outcome: Progressing   Problem: Skin Integrity: Goal: Risk for impaired skin integrity will decrease Outcome: Progressing

## 2025-02-04 ENCOUNTER — Other Ambulatory Visit: Payer: Self-pay

## 2025-02-04 ENCOUNTER — Encounter (HOSPITAL_COMMUNITY): Admission: EM | Payer: Self-pay | Source: Home / Self Care | Attending: Internal Medicine

## 2025-02-04 ENCOUNTER — Encounter (HOSPITAL_COMMUNITY): Payer: Self-pay

## 2025-02-04 ENCOUNTER — Inpatient Hospital Stay (HOSPITAL_COMMUNITY): Admitting: Certified Registered Nurse Anesthetist

## 2025-02-04 ENCOUNTER — Inpatient Hospital Stay (HOSPITAL_COMMUNITY)

## 2025-02-04 LAB — RENAL FUNCTION PANEL
Albumin: 3.1 g/dL — ABNORMAL LOW (ref 3.5–5.0)
Albumin: 3.6 g/dL (ref 3.5–5.0)
Anion gap: 15 (ref 5–15)
Anion gap: 17 — ABNORMAL HIGH (ref 5–15)
BUN: 52 mg/dL — ABNORMAL HIGH (ref 8–23)
BUN: 22 mg/dL (ref 8–23)
CO2: 18 mmol/L — ABNORMAL LOW (ref 22–32)
CO2: 22 mmol/L (ref 22–32)
Calcium: 8.5 mg/dL — ABNORMAL LOW (ref 8.9–10.3)
Calcium: 9.3 mg/dL (ref 8.9–10.3)
Chloride: 100 mmol/L (ref 98–111)
Chloride: 93 mmol/L — ABNORMAL LOW (ref 98–111)
Creatinine, Ser: 12.3 mg/dL — ABNORMAL HIGH (ref 0.44–1.00)
Creatinine, Ser: 6.7 mg/dL — ABNORMAL HIGH (ref 0.44–1.00)
GFR, Estimated: 3 mL/min — ABNORMAL LOW
GFR, Estimated: 6 mL/min — ABNORMAL LOW
Glucose, Bld: 107 mg/dL — ABNORMAL HIGH (ref 70–99)
Glucose, Bld: 166 mg/dL — ABNORMAL HIGH (ref 70–99)
Phosphorus: 5.5 mg/dL — ABNORMAL HIGH (ref 2.5–4.6)
Phosphorus: 3.9 mg/dL (ref 2.5–4.6)
Potassium: 6.1 mmol/L — ABNORMAL HIGH (ref 3.5–5.1)
Potassium: 4.9 mmol/L (ref 3.5–5.1)
Sodium: 134 mmol/L — ABNORMAL LOW (ref 135–145)
Sodium: 132 mmol/L — ABNORMAL LOW (ref 135–145)

## 2025-02-04 LAB — CBC
HCT: 37.7 % (ref 36.0–46.0)
Hemoglobin: 12 g/dL (ref 12.0–15.0)
MCH: 29.7 pg (ref 26.0–34.0)
MCHC: 31.8 g/dL (ref 30.0–36.0)
MCV: 93.3 fL (ref 80.0–100.0)
Platelets: 170 10*3/uL (ref 150–400)
RBC: 4.04 MIL/uL (ref 3.87–5.11)
RDW: 14.8 % (ref 11.5–15.5)
WBC: 12.3 10*3/uL — ABNORMAL HIGH (ref 4.0–10.5)
nRBC: 0 % (ref 0.0–0.2)

## 2025-02-04 LAB — MAGNESIUM: Magnesium: 2 mg/dL (ref 1.7–2.4)

## 2025-02-04 LAB — POCT I-STAT, CHEM 8
BUN: 51 mg/dL — ABNORMAL HIGH (ref 8–23)
Calcium, Ion: 1.38 mmol/L (ref 1.15–1.40)
Chloride: 105 mmol/L (ref 98–111)
Creatinine, Ser: 12.7 mg/dL — ABNORMAL HIGH (ref 0.44–1.00)
Glucose, Bld: 146 mg/dL — ABNORMAL HIGH (ref 70–99)
HCT: 41 % (ref 36.0–46.0)
Hemoglobin: 13.9 g/dL (ref 12.0–15.0)
Potassium: 6.2 mmol/L — ABNORMAL HIGH (ref 3.5–5.1)
Sodium: 133 mmol/L — ABNORMAL LOW (ref 135–145)
TCO2: 17 mmol/L — ABNORMAL LOW (ref 22–32)

## 2025-02-04 LAB — GLUCOSE, CAPILLARY: Glucose-Capillary: 158 mg/dL — ABNORMAL HIGH (ref 70–99)

## 2025-02-04 MED ORDER — ALBUTEROL SULFATE HFA 108 (90 BASE) MCG/ACT IN AERS
INHALATION_SPRAY | RESPIRATORY_TRACT | Status: DC | PRN
  Administered 2025-02-04: 5 via RESPIRATORY_TRACT

## 2025-02-04 MED ORDER — PROPOFOL 10 MG/ML IV BOLUS
INTRAVENOUS | Status: AC
  Filled 2025-02-04: qty 20

## 2025-02-04 MED ORDER — CEFAZOLIN SODIUM-DEXTROSE 2-4 GM/100ML-% IV SOLN
INTRAVENOUS | Status: AC
  Filled 2025-02-04: qty 100

## 2025-02-04 MED ORDER — FENTANYL CITRATE (PF) 100 MCG/2ML IJ SOLN
INTRAMUSCULAR | Status: AC
  Filled 2025-02-04: qty 2

## 2025-02-04 MED ORDER — FENTANYL CITRATE (PF) 250 MCG/5ML IJ SOLN
INTRAMUSCULAR | Status: DC | PRN
  Administered 2025-02-04 (×4): 25 ug via INTRAVENOUS

## 2025-02-04 MED ORDER — DEXTROSE 50 % IV SOLN
INTRAVENOUS | Status: DC | PRN
  Administered 2025-02-04: 12.5 g via INTRAVENOUS

## 2025-02-04 MED ORDER — SODIUM ZIRCONIUM CYCLOSILICATE 10 G PO PACK: 10.0000 g | PACK | ORAL | Status: AC

## 2025-02-04 MED ORDER — PHENYLEPHRINE 80 MCG/ML (10ML) SYRINGE FOR IV PUSH (FOR BLOOD PRESSURE SUPPORT)
PREFILLED_SYRINGE | INTRAVENOUS | Status: DC | PRN
  Administered 2025-02-04: 120 ug via INTRAVENOUS
  Administered 2025-02-04: 80 ug via INTRAVENOUS
  Administered 2025-02-04: 120 ug via INTRAVENOUS
  Administered 2025-02-04: 80 ug via INTRAVENOUS

## 2025-02-04 MED ORDER — LIDOCAINE HCL (PF) 1 % IJ SOLN: 5.0000 mL | INTRAMUSCULAR | Status: DC | PRN

## 2025-02-04 MED ORDER — PROPOFOL 10 MG/ML IV BOLUS
INTRAVENOUS | Status: DC | PRN
  Administered 2025-02-04: 40 mg via INTRAVENOUS
  Administered 2025-02-04: 20 mg via INTRAVENOUS
  Administered 2025-02-04: 140 mg via INTRAVENOUS

## 2025-02-04 MED ORDER — MIDODRINE HCL 5 MG PO TABS
ORAL_TABLET | ORAL | Status: AC
  Filled 2025-02-04: qty 1

## 2025-02-04 MED ORDER — HEPARIN SODIUM (PORCINE) 1000 UNIT/ML DIALYSIS
1000.0000 [IU] | INTRAMUSCULAR | Status: DC | PRN
  Administered 2025-02-04: 4600 [IU] via INTRAVENOUS_CENTRAL
  Filled 2025-02-04: qty 1

## 2025-02-04 MED ORDER — DEXAMETHASONE SOD PHOSPHATE PF 10 MG/ML IJ SOLN
INTRAMUSCULAR | Status: DC | PRN
  Administered 2025-02-04: 10 mg via INTRAVENOUS

## 2025-02-04 MED ORDER — ONDANSETRON HCL 4 MG/2ML IJ SOLN
INTRAMUSCULAR | Status: DC | PRN
  Administered 2025-02-04: 4 mg via INTRAVENOUS

## 2025-02-04 MED ORDER — ALTEPLASE 2 MG IJ SOLR: 2.0000 mg | Freq: Once | INTRAMUSCULAR | Status: DC | PRN

## 2025-02-04 MED ORDER — DEXTROSE 50 % IV SOLN
INTRAVENOUS | Status: AC
  Filled 2025-02-04: qty 50

## 2025-02-04 MED ORDER — CEFAZOLIN SODIUM-DEXTROSE 2-3 GM-%(50ML) IV SOLR
INTRAVENOUS | Status: DC | PRN
  Administered 2025-02-04: 2 g via INTRAVENOUS

## 2025-02-04 MED ORDER — ACETAMINOPHEN 10 MG/ML IV SOLN: 1000.0000 mg | Freq: Once | INTRAVENOUS | Status: DC | PRN

## 2025-02-04 MED ORDER — FENTANYL CITRATE (PF) 100 MCG/2ML IJ SOLN
25.0000 ug | INTRAMUSCULAR | Status: DC | PRN
  Administered 2025-02-04: 25 ug via INTRAVENOUS

## 2025-02-04 MED ORDER — INSULIN ASPART 100 UNIT/ML IJ SOLN
INTRAMUSCULAR | Status: DC | PRN
  Administered 2025-02-04: 5 [IU] via SUBCUTANEOUS

## 2025-02-04 MED ORDER — ORAL CARE MOUTH RINSE: 15.0000 mL | Freq: Once | OROMUCOSAL | Status: AC

## 2025-02-04 MED ORDER — CHLORHEXIDINE GLUCONATE 4 % EX SOLN
CUTANEOUS | Status: AC
  Filled 2025-02-04: qty 15

## 2025-02-04 MED ORDER — LIDOCAINE-EPINEPHRINE 1 %-1:100000 IJ SOLN
INTRAMUSCULAR | Status: AC
  Filled 2025-02-04: qty 1

## 2025-02-04 MED ORDER — SODIUM CHLORIDE 0.9 % IV SOLN: INTRAVENOUS | Status: DC

## 2025-02-04 MED ORDER — FENTANYL CITRATE (PF) 50 MCG/ML IJ SOSY
25.0000 ug | PREFILLED_SYRINGE | Freq: Once | INTRAMUSCULAR | Status: AC
  Administered 2025-02-04: 25 ug via INTRAVENOUS
  Filled 2025-02-04: qty 1

## 2025-02-04 MED ORDER — MIDAZOLAM HCL 2 MG/2ML IJ SOLN
INTRAMUSCULAR | Status: AC
  Filled 2025-02-04: qty 2

## 2025-02-04 MED ORDER — ROCURONIUM BROMIDE 10 MG/ML (PF) SYRINGE
PREFILLED_SYRINGE | INTRAVENOUS | Status: AC
  Filled 2025-02-04: qty 10

## 2025-02-04 MED ORDER — OXYCODONE HCL 5 MG PO TABS
5.0000 mg | ORAL_TABLET | ORAL | Status: AC | PRN
  Administered 2025-02-04: 5 mg via ORAL
  Filled 2025-02-04: qty 1

## 2025-02-04 MED ORDER — LIDOCAINE 2% (20 MG/ML) 5 ML SYRINGE
INTRAMUSCULAR | Status: DC | PRN
  Administered 2025-02-04: 60 mg via INTRAVENOUS

## 2025-02-04 MED ORDER — LIDOCAINE-PRILOCAINE 2.5-2.5 % EX CREA: 1.0000 | TOPICAL_CREAM | CUTANEOUS | Status: DC | PRN

## 2025-02-04 MED ORDER — CALCIUM CHLORIDE 10 % IV SOLN
INTRAVENOUS | Status: DC | PRN
  Administered 2025-02-04: 1 g via INTRAVENOUS

## 2025-02-04 MED ORDER — ONDANSETRON HCL 4 MG/2ML IJ SOLN
4.0000 mg | Freq: Four times a day (QID) | INTRAMUSCULAR | Status: AC | PRN
  Administered 2025-02-04: 4 mg via INTRAVENOUS
  Filled 2025-02-04: qty 2

## 2025-02-04 MED ORDER — PENTAFLUOROPROP-TETRAFLUOROETH EX AERO: 1.0000 | INHALATION_SPRAY | CUTANEOUS | Status: DC | PRN

## 2025-02-04 MED ORDER — HEPARIN SODIUM (PORCINE) 1000 UNIT/ML IJ SOLN
INTRAMUSCULAR | Status: AC
  Filled 2025-02-04: qty 10

## 2025-02-04 MED ORDER — CHLORHEXIDINE GLUCONATE 0.12 % MT SOLN: 15.0000 mL | Freq: Once | OROMUCOSAL | Status: AC

## 2025-02-04 MED ORDER — IOHEXOL 300 MG/ML  SOLN: 50.0000 mL | Freq: Once | INTRAMUSCULAR | Status: AC | PRN

## 2025-02-04 NOTE — Progress Notes (Signed)
" °   02/04/25 1958  Vitals  Pulse Rate 95  Resp 16  BP (!) 108/55  SpO2 100 %  O2 Device Nasal Cannula  Weight 91.6 kg  Type of Weight Actual (standing)  Oxygen  Therapy  O2 Flow Rate (L/min) 2 L/min  Patient Activity (if Appropriate) In bed  Pulse Oximetry Type Continuous  Oximetry Probe Site Changed No  Post Treatment  Dialyzer Clearance Heavily streaked  Liters Processed 73.5  Fluid Removed (mL) 300 mL  Tolerated HD Treatment No (Comment)  Post-Hemodialysis Comments BP decreased during HD run. UF goal off. Patient given Midodrine  5 mg and NS bolus for BP support. Patient remained asymptomatic during run. Talkative and denies complaints. C/O some cath site pain post HD however wanted to wait to take pain medication with meal.    "

## 2025-02-04 NOTE — Progress Notes (Signed)
 " [facility] Progress Note   Subjective:    Seen and examined patient at bedside. S/p new TDC placement earlier today. Plan for HD this afternoon. Current K+ is 6.2. Informed by the HD RN the the HD unit ran out of 1K baths at this time. Will order Lokelma  10GM X 1 STAT. Also will obtain a standing weight pre-HD to determine UFG. Currently, she denies SOB, CP, and N/V. She is reporting pain at [facility] site.  Objective Vitals:   02/04/25 1119 02/04/25 1145 02/04/25 1200 02/04/25 1447  BP: 137/69 (!) 124/46 (!) 117/51 (!) 135/56  Pulse: 97 96 93 88  Resp: 15 13 10 16   Temp: 97.6 F (36.4 C)  97.6 F (36.4 C) 97.7 F (36.5 C)  TempSrc:    Oral  SpO2: 98% 98% 96%   Weight:      Height:       Physical Exam General: Awake, alert, on O2, NAD Heart: RRR Lungs: Clear, normal wob Abdomen: non-tender Extremities: no LE edema  Dialysis Access: L temp cath removed, new L TDC placed today  Filed Weights   02/02/25 1527 02/04/25 0500 02/04/25 0945  Weight: 91.8 kg 91.4 kg 91.4 kg    Intake/Output Summary (Last 24 hours) at 02/04/2025 1453 Last data filed at 02/04/2025 1047 Gross per 24 hour  Intake 250 ml  Output --  Net 250 ml    Additional Objective Labs: Basic Metabolic Panel: Recent Labs  Lab 02/02/25 0846 02/03/25 0310 02/04/25 0339 02/04/25 1059  NA 132* 134* 134* 133*  K 5.6* 5.6* 6.1* 6.2*  CL 97* 101 100 105  CO2 21* 20* 18*  --   GLUCOSE 139* 108* 107* 146*  BUN 40* 39* 52* 51*  CREATININE 10.20* 10.30* 12.30* 12.70*  CALCIUM  9.3 8.9 8.5*  --   PHOS 4.9* 5.5* 5.5*  --    Liver Function Tests: Recent Labs  Lab 02/02/25 0846 02/03/25 0310 02/04/25 0339  ALBUMIN  3.5 3.1* 3.1*   No results for input(s): LIPASE, AMYLASE in the last 168 hours. CBC: Recent Labs  Lab 01/29/25 0456 02/02/25 0846 02/03/25 0310 02/04/25 0339 02/04/25 1059  WBC 10.1 10.5 10.1 12.3*  --   HGB 13.3 13.3 11.7* 12.0 13.9  HCT 40.1 42.4 37.6 37.7 41.0  MCV 88.9  93.0 93.3 93.3  --   PLT 149* 171 161 170  --    Blood Culture    Component Value Date/Time   SDES CATH TIP 01/31/2025 1735   SPECREQUEST NONE 01/31/2025 1735   CULT  01/31/2025 1735    NO GROWTH 3 DAYS Performed at [facility], [address] [place], [place] [address]    REPTSTATUS 02/03/2025 FINAL 01/31/2025 1735    Cardiac Enzymes: No results for input(s): CKTOTAL, CKMB, CKMBINDEX, TROPONINI in the last 168 hours. CBG: Recent Labs  Lab 02/04/25 1450  GLUCAP 158*   Iron  Studies: No results for input(s): IRON , TIBC, TRANSFERRIN, FERRITIN in the last 72 hours. Lab Results  Component Value Date   INR 1.2 05/16/2022   INR 1.3 (H) 07/16/2021   INR 1.2 05/28/2021   Studies/Results: No results found.  Medications:  vancomycin  1,000 mg (02/04/25 1349)    vitamin C   250 mg Oral Daily   atorvastatin   20 mg Oral Q24H   Chlorhexidine  Gluconate Cloth  6 each Topical Q0600   doxercalciferol   4 mcg Oral Once per day on Monday Wednesday Friday   ezetimibe   10 mg Oral Q24H  heparin  injection (subcutaneous)  5,000 Units Subcutaneous Q8H   midodrine   5 mg Oral TID WC   nicotine   21 mg Transdermal Q24H   pantoprazole   40 mg Oral Daily   sevelamer  carbonate  2,400 mg Oral TID WC   sodium zirconium cyclosilicate   10 g Oral Daily   vitamin E   200 Units Oral Daily    Dialysis Orders: [facility] MWF 1st shift 3hr30 2/2 180NR EDW 90kg (has been reaching and sometimes below) Mirc 0, Hect4  Assessment/Plan: Dialysis access - Concern for catheter related infection. Complicated case -Do not want her to end up losing the access site ([facility]) as per VIR unable to access to right side. Patient refusing a fem catheter and also permanent access. Plan was for lateralization of the tunnel with new exit site. However found to have infected tunneled site on IR exam so temp cath placed on 2/2.  Initial BCx negative. Cath tip cx ng to date. WBC 10.5. IR has been consulted - L temp  HD cath removed and new L TDC placed today. She is complaining of pain at [facility] site. Oxycodone  PRN ordered-she can have this! ESRD. HD MWF. S/p new L TDC placed today then HD later this afternoon. Current K+ is 6.2 and our HD unit ran out of 1K baths at this time. Management is working on shipment. In the meantime, will order Loklema 10GM X 1 now. Re-check labs after HD this afternoon. Hypotension/volume  - BP in goal. Does not appear volume overloaded.  Under dry weight as mentioned above.   Anemia of CKD - Hgb 13.3. No indication for ESA/iron  therapy with active infection; last ESA was given 09/13/24.  Secondary Hyperparathyroidism -  Ca in goal. Phos 4.8. Continue VDRA and binders.  Nutrition - Renal diet w/fluid restrictions when advanced.  H/o COPD PAD s/p bypass Prior DVT/PE on Eliquis  Dispo: Okay for discharge from a renal standpoint if she tolerates today's HD AND K+ is stable. Plan to check labs after HD this afternoon.  Charmaine Piety, NP [facility] 02/04/2025,2:53 PM  LOS: 9 days    "

## 2025-02-04 NOTE — Anesthesia Procedure Notes (Signed)
 Procedure Name: Intubation Date/Time: 02/04/2025 10:17 AM  Performed by: Leopoldo Wanda DASEN, CRNAPre-anesthesia Checklist: Patient identified, Emergency Drugs available, Suction available and Patient being monitored Patient Re-evaluated:Patient Re-evaluated prior to induction Oxygen  Delivery Method: Circle System Utilized Preoxygenation: Pre-oxygenation with 100% oxygen  Induction Type: IV induction Ventilation: Mask ventilation without difficulty Laryngoscope Size: Mac and 3 Grade View: Grade II Tube type: Oral Tube size: 7.0 mm Number of attempts: 1 Airway Equipment and Method: Stylet Placement Confirmation: ETT inserted through vocal cords under direct vision, positive ETCO2 and breath sounds checked- equal and bilateral Secured at: 21 cm Tube secured with: Tape Dental Injury: Teeth and Oropharynx as per pre-operative assessment

## 2025-02-04 NOTE — Anesthesia Preprocedure Evaluation (Addendum)
 "                                  Anesthesia Evaluation  Patient identified by MRN, date of birth, ID band Patient awake    Reviewed: Allergy & Precautions, NPO status , [name] Chart, lab work & pertinent test results  Airway Mallampati: II  TM Distance: >3 FB Neck ROM: Full    Dental no notable dental hx. (+) Edentulous Upper, Edentulous Lower   Pulmonary sleep apnea , COPD, Current Smoker, PE   Pulmonary exam normal        Cardiovascular hypertension, + CAD, + Peripheral Vascular Disease, +CHF and + DVT   Rhythm:Regular Rate:Normal  ECHO: IMPRESSIONS   1. Left ventricular ejection fraction, by estimation, is 55 to 60%. The  left ventricle has normal function. The left ventricle has no regional  wall motion abnormalities. There is moderate left ventricular hypertrophy.  Left ventricular diastolic  parameters are consistent with Grade I diastolic dysfunction (impaired  relaxation).   2. Right ventricular systolic function is normal. The right ventricular  size is normal. There is normal pulmonary artery systolic pressure. The  estimated right ventricular systolic pressure is 27.0 mmHg.   3. The mitral valve is normal in structure. Trivial mitral valve  regurgitation. Moderate mitral annular calcification.   4. The aortic valve is calcified. There is severe calcifcation of the  aortic valve. Aortic valve regurgitation is mild. Mild aortic valve  stenosis. Aortic valve area, by VTI measures 1.84 cm. Aortic valve mean  gradient measures 12.0 mmHg. Aortic valve  Vmax measures 2.32 m/s.   5. The inferior vena cava is normal in size with greater than 50%  respiratory variability, suggesting right atrial pressure of 3 mmHg.     Neuro/Psych   Anxiety Depression    negative neurological ROS     GI/Hepatic Neg liver ROS,GERD  Medicated,,  Endo/Other  negative endocrine ROS    Renal/GU ESRF and DialysisRenal disease  negative genitourinary    Musculoskeletal  (+) Arthritis ,    Abdominal Normal abdominal exam  (+)   Peds  Hematology  (+) Blood dyscrasia, anemia Lab Results      Component                Value               Date                      WBC                      12.3 (H)            02/04/2025                HGB                      12.0                02/04/2025                HCT                      37.7                02/04/2025                MCV  93.3                02/04/2025                PLT                      170                 02/04/2025             Lab Results      Component                Value               Date                      NA                       134 (L)             02/04/2025                K                        6.1 (H)             02/04/2025                CO2                      18 (L)              02/04/2025                GLUCOSE                  107 (H)             02/04/2025                BUN                      52 (H)              02/04/2025                CREATININE               12.30 (H)           02/04/2025                CALCIUM                   8.5 (L)             02/04/2025                EGFR                     5 (L)               05/30/2022                GFRNONAA                 3 (L)               02/04/2025              Anesthesia Other Findings  Reproductive/Obstetrics                              Anesthesia Physical Anesthesia Plan  ASA: 3  Anesthesia Plan: General   Post-op Pain Management:    Induction: Intravenous  PONV Risk Score and Plan: 2 and Ondansetron , Dexamethasone  and Treatment may vary due to age or medical condition  Airway Management Planned: Mask and Oral ETT  Additional Equipment: None  Intra-op Plan:   Post-operative Plan: Extubation in OR  Informed Consent: I have reviewed the patients History and Physical, chart, labs and discussed the procedure including the risks,  benefits and alternatives for the proposed anesthesia with the patient or authorized representative who has indicated his/her understanding and acceptance.     Dental advisory given  Plan Discussed with: CRNA  Anesthesia Plan Comments:          Anesthesia Quick Evaluation  "

## 2025-02-04 NOTE — H&P (Signed)
 "     Chief Complaint: Patient was seen in consultation today for conversion of non tunneled dialysis catheter to tunneled dialysis catheter Chief Complaint  Patient presents with   Hypotension   Weakness   at the request of [facility]  Supervising Physician: Jenna Hacker  Patient Status: [facility] - In-pt  History of Present Illness: Whitney Walker is a 69 y.o. female   FULL Code status  Scheduled today in IR for non tunneled dialysis catheter to tunneled catheter with anesthesia  Was seen 2/4 by IR PA-- see note Planned for procedure in IR today    Past Medical History:  Diagnosis Date   Acute left-sided low back pain without sciatica 02/22/2024   Anemia    Anxiety    CHF (congestive heart failure) (HCC)    Colon cancer (HCC)    treatment surgery   Complication of anesthesia    after first C- [place] couldnt walk after, patient denies having a spinal   COPD (chronic obstructive pulmonary disease) (HCC)    Coronary artery disease    Depression    DVT (deep venous thrombosis) (HCC)    Encounter for annual health examination 08/05/2023   ESRD on hemodialysis (HCC)    Hemo: MWF   History of blood transfusion 04/2018   Hypertension    07/07/18- no longer takes BP medications   Left hip pain 05/16/2022   Left leg pain 05/09/2020   Leucocytosis 04/28/2012   Meningitis    Orthostatic hypotension 05/16/2022   Pain in limb 07/30/2013   PE (pulmonary embolism)    Peripheral vascular disease    Primary hypertension 11/15/2013   Rash 11/25/2023   Restless legs    Shortness of breath    with exertion   Sleep apnea    SOB (shortness of breath) 03/03/2019   Vertigo     Past Surgical History:  Procedure Laterality Date   ABDOMINAL AORTAGRAM N/A 07/26/2013   Procedure: ABDOMINAL EZELLA;  Surgeon: Redell LITTIE Door, MD;  Location: [facility] CATH LAB;  Service: Cardiovascular;  Laterality: N/A;   ABDOMINAL HYSTERECTOMY     AV FISTULA PLACEMENT Left 05/05/2018   Procedure:  ARTERIOVENOUS (AV) FISTULA CREATION BRACHIOCEPHALIC;  Surgeon: Sheree Penne Bruckner, MD;  Location: [facility] OR;  Service: Vascular;  Laterality: Left;   AV FISTULA PLACEMENT Left 07/16/2018   Procedure: ARTERIOVENOUS FISTULA CREATION;  Surgeon: Sheree Penne Bruckner, MD;  Location: [facility] OR;  Service: Vascular;  Laterality: Left;   BASCILIC VEIN TRANSPOSITION Left 09/03/2018   Procedure: BASILIC VEIN TRANSPOSITION SECOND STAGE;  Surgeon: Sheree Penne Bruckner, MD;  Location: [facility] OR;  Service: Vascular;  Laterality: Left;   BREAST BIOPSY Left    CESAREAN SECTION     X 3 1974-1977   CHOLECYSTECTOMY     CHOLECYSTECTOMY  1980/s   COLECTOMY  2010   DIVERTICULOSIS SURGERY-2002  2012   FEMORAL-POPLITEAL BYPASS GRAFT Left 08/13/2013   Procedure: BYPASS GRAFT FEMORAL-POPLITEAL ARTERY WITH NON-REVERSED SAPHANEOUS VEIN; ULTRASOUND GUIDED;  Surgeon: Lynwood JONETTA Collum, MD;  Location: [facility] OR;  Service: Vascular;  Laterality: Left;   FLEXIBLE SIGMOIDOSCOPY N/A 12/10/2022   Procedure: FLEXIBLE SIGMOIDOSCOPY;  Surgeon: Rollin Dover, MD;  Location: [facility];  Service: Gastroenterology;  Laterality: N/A;   INSERTION OF DIALYSIS CATHETER Right 04/30/2018   Procedure: INSERTION OF Right Internal Jugular DIALYSIS CATHETER;  Surgeon: Serene Gaile ORN, MD;  Location: [facility] OR;  Service: Vascular;  Laterality: Right;   IR FLUORO GUIDE CV LINE LEFT  05/01/2022   IR FLUORO  GUIDE CV LINE LEFT  05/21/2022   IR FLUORO GUIDE CV LINE RIGHT  07/24/2020   IR FLUORO GUIDE CV LINE RIGHT  07/21/2021   IR FLUORO GUIDE CV LINE RIGHT  05/20/2022   IR PTA VENOUS EXCEPT DIALYSIS CIRCUIT  05/21/2022   IR REMOVAL TUN CV CATH W/O FL  07/21/2020   IR REMOVAL TUN CV CATH W/O FL  07/19/2021   IR REMOVAL TUN CV CATH W/O FL  05/17/2022   IR TUNNELED CENTRAL VENOUS CATH PLC W IMG  01/31/2025   IR US  GUIDE VASC ACCESS LEFT  05/21/2022   IR US  GUIDE VASC ACCESS RIGHT  07/24/2020   IR US  GUIDE VASC ACCESS RIGHT  07/21/2021   IR US  GUIDE VASC ACCESS RIGHT   05/20/2022   IR VENOCAVAGRAM SVC  05/21/2022   LEFT HEART CATH AND CORONARY ANGIOGRAPHY N/A 05/01/2021   Procedure: LEFT HEART CATH AND CORONARY ANGIOGRAPHY;  Surgeon: Ladona Heinz, MD;  Location: [facility] INVASIVE CV LAB;  Service: Cardiovascular;  Laterality: N/A;   LEFT HEART CATH AND CORONARY ANGIOGRAPHY N/A 05/02/2022   Procedure: LEFT HEART CATH AND CORONARY ANGIOGRAPHY;  Surgeon: Ladona Heinz, MD;  Location: [facility] INVASIVE CV LAB;  Service: Cardiovascular;  Laterality: N/A;   LOWER EXTREMITY ANGIOGRAM Left 07/26/2013   Procedure: LOWER EXTREMITY ANGIOGRAM;  Surgeon: Redell LITTIE Door, MD;  Location: [facility] CATH LAB;  Service: Cardiovascular;  Laterality: Left;    Allergies: Difelikefalin, Clindamycin /lincomycin, Gadolinium derivatives, Chlorhexidine , and Wound dressing adhesive  Medications: Prior to Admission medications  Medication Sig Start Date End Date Taking? Authorizing Provider  acetaminophen  (TYLENOL ) 650 MG CR tablet Take 1,950 mg by mouth every 8 (eight) hours as needed for pain.   Yes [provider]  albuterol  (VENTOLIN  HFA) 108 (90 Base) MCG/ACT inhaler Inhale 2 puffs into the lungs every 6 (six) hours as needed for wheezing or shortness of breath. 12/17/24  Yes Georgina Speaks, FNP  atorvastatin  (LIPITOR) 20 MG tablet Take 1 tablet (20 mg total) by mouth daily. Patient taking differently: Take 20 mg by mouth at bedtime. 12/17/24 01/26/25 Yes Georgina Speaks, FNP  Biotin  10 MG CAPS Take 10 mg by mouth daily at 12 noon.   Yes [provider]  diclofenac  Sodium (VOLTAREN  ARTHRITIS PAIN) 1 % GEL Apply 1 Application topically as needed.   Yes [provider]  ELIQUIS  5 MG TABS tablet TAKE 1 TABLET (5 MG TOTAL) BY MOUTH 2 (TWO) TIMES DAILY. 11/22/24  Yes Georgina Speaks, FNP  ezetimibe  (ZETIA ) 10 MG tablet Take 10 mg by mouth at bedtime.   Yes [provider]  meclizine  (ANTIVERT ) 12.5 MG tablet Take 1 tablet (12.5 mg total) by mouth 3 (three) times daily as needed for  dizziness. 09/18/23  Yes Georgina Speaks, FNP  midodrine  (PROAMATINE ) 5 MG tablet Take 1 tablet (5 mg total) by mouth 2 (two) times daily as needed (hypotension). Take one tab before dialysis Patient taking differently: Take 5 mg by mouth 2 (two) times daily as needed (hypotension). Take one extra tablet before dialysis 12/02/24  Yes Ladona Heinz, MD  multivitamin (RENA-VIT) TABS tablet Take 1 tablet by mouth at bedtime.   Yes [provider]  omeprazole  (PRILOSEC) 20 MG capsule TAKE 1 CAPSULE (20 MG TOTAL) BY MOUTH DAILY. 11/22/24  Yes Georgina Speaks, FNP  sevelamer  carbonate (RENVELA ) 800 MG tablet Take 1,600-2,400 mg by mouth See admin instructions. TAKE 3 TABLETS BY MOUTH THREE TIMES DAILY WITH MEALS AND 2 TABLETS TWICE DAILY WITH SNACKS. 10/27/23  Yes  [provider]  sodium zirconium cyclosilicate  (LOKELMA ) 10 g PACK packet Take 10 g by mouth daily.   Yes [provider]  Tenapanor  HCl, CKD, (XPHOZAH ) 30 MG TABS Take 1 tablet by mouth in the morning and at bedtime.   Yes [provider]  Tiotropium Bromide-Olodaterol (STIOLTO RESPIMAT ) 2.5-2.5 MCG/ACT AERS Inhale 2 puffs into the lungs daily. Patient taking differently: Inhale 2 puffs into the lungs as needed. 05/04/24  Yes Hunsucker, Donnice SAUNDERS, MD  vitamin E  200 UNIT capsule Take 200 Units by mouth daily.   Yes [provider]  nicotine  (NICODERM CQ  - DOSED IN MG/24 HOURS) 21 mg/24hr patch Place 1 patch (21 mg total) onto the skin daily. Patient not taking: Reported on 01/28/2025 01/04/25 01/04/26  Georgina Speaks, FNP     Family History  Problem Relation Age of Onset   Cancer Mother 3       breast and bone   Cancer Father 89       prostate   Hypertension Sister    Bleeding Disorder Sister    Hypertension Daughter    Cancer Cousin 20       breast cancer    Congestive Heart Failure Son    Breast cancer Neg Hx     Social History   Socioeconomic History   Marital status: Single    Spouse name: Not  on file   Number of children: 4   Years of education: 12th gd   Highest education level: Not on file  Occupational History   Occupation: N/A  Tobacco Use   Smoking status: Some Days    Current packs/day: 0.00    Average packs/day: 0.5 packs/day for 50.0 years (25.0 ttl pk-yrs)    Types: Cigarettes    Start date: 10/13/1972    Last attempt to quit: 10/13/2022    Years since quitting: 2.3    Passive exposure: Past   Smokeless tobacco: Former   Tobacco comments:    She is using a vape when she runs out of her cigarettes, 7/27 - down to 1-2 cigs a day. 03/30/24 - less than 1-2 cigs a day, 12/09/2024 -   Vaping Use   Vaping status: Every Day   Start date: 06/28/2020   Last attempt to quit: 05/20/2022   Devices: Vuse  Substance and Sexual Activity   Alcohol use: Not Currently    Comment: she used to drink alcohol, quit in 2010; h/o heavy use    Drug use: Not Currently    Types: Oxycodone    Sexual activity: Not Currently  Other Topics Concern   Not on file  Social History Narrative   2 cups of coffee a day    Social Drivers of Health   Tobacco Use: High Risk (02/04/2025)   Patient History    Smoking Tobacco Use: Some Days    Smokeless Tobacco Use: Former    Passive Exposure: Past  Physicist, Medical Strain: Low Risk (03/30/2024)   Overall Financial Resource Strain (CARDIA)    Difficulty of Paying Living Expenses: Not hard at all  Food Insecurity: No Food Insecurity (01/27/2025)   Epic    Worried About Radiation Protection Practitioner of Food in the Last Year: Never true    Ran Out of Food in the Last Year: Never true  Transportation Needs: No Transportation Needs (01/27/2025)   Epic    Lack of Transportation (Medical): No    Lack of Transportation (Non-Medical): No  Physical Activity: Insufficiently Active (05/06/2024)   Exercise Vital Sign  Days of Exercise per Week: 7 days    Minutes of Exercise per Session: 20 min  Stress: No Stress Concern Present (03/30/2024)   [employer] of Occupational  Health - Occupational Stress Questionnaire    Feeling of Stress : Not at all  Social Connections: Moderately Isolated (01/27/2025)   Social Connection and Isolation Panel    Frequency of Communication with Friends and Family: More than three times a week    Frequency of Social Gatherings with Friends and Family: More than three times a week    Attends Religious Services: 1 to 4 times per year    Active Member of Clubs or Organizations: No    Attends Banker Meetings: Never    Marital Status: Never married  Depression (PHQ2-9): Low Risk (06/22/2024)   Depression (PHQ2-9)    PHQ-2 Score: 0  Alcohol Screen: Low Risk (03/30/2024)   Alcohol Screen    Last Alcohol Screening Score (AUDIT): 0  Housing: Low Risk (01/27/2025)   Epic    Unable to Pay for Housing in the Last Year: No    Number of Times Moved in the Last Year: 0    Homeless in the Last Year: No  Utilities: Not At Risk (01/27/2025)   Epic    Threatened with loss of utilities: No  Health Literacy: Adequate Health Literacy (05/06/2024)   B1300 Health Literacy    Frequency of need for help with medical instructions: Never     Review of Systems: A 12 point ROS discussed and pertinent positives are indicated in the HPI above.  All other systems are negative.  Review of Systems  Constitutional:  Positive for activity change. Negative for fever.  Respiratory:  Negative for cough and shortness of breath.   Cardiovascular:  Negative for chest pain.  Neurological:  Positive for weakness.  Psychiatric/Behavioral:  Negative for behavioral problems and confusion.     Vital Signs: BP 120/78 (BP Location: Right Arm)   Pulse 91   Temp 97.6 F (36.4 C) (Oral)   Resp 18   Ht 5' 2 (1.575 m)   Wt 201 lb 8 oz (91.4 kg)   SpO2 96%   BMI 36.85 kg/m   Advance Care Plan: The advanced care plan/surrogate decision maker was discussed at the time of visit and documented in the medical record.    Physical Exam Vitals reviewed.   HENT:     Mouth/Throat:     Mouth: Mucous membranes are moist.  Cardiovascular:     Rate and Rhythm: Normal rate and regular rhythm.     Heart sounds: Normal heart sounds. No murmur heard. Pulmonary:     Effort: Pulmonary effort is normal.     Breath sounds: Normal breath sounds. No wheezing.  Musculoskeletal:        General: Normal range of motion.  Skin:    General: Skin is warm and dry.     Comments: Site of Left neck dialysis catheter is clean- no sign of infection Tender to touch  Neurological:     Mental Status: She is alert and oriented to person, place, and time.  Psychiatric:        Behavior: Behavior normal.     Imaging: DG CHEST PORT 1 VIEW Result Date: 02/02/2025 CLINICAL DATA:  Central line care. EXAM: PORTABLE CHEST 1 VIEW COMPARISON:  01/27/2025 FINDINGS: Left-sided dialysis catheter tip overlies the lower SVC. No pneumothorax. Mild cardiomegaly stable. Mediastinal contours are unchanged. Hazy opacity at the left lung base may represent  pleural effusion or soft tissue attenuation. No confluent opacity. Slight vascular congestion. IMPRESSION: 1. Left-sided dialysis catheter tip overlies the lower SVC. No pneumothorax. 2. Hazy opacity at the left lung base may represent pleural effusion or soft tissue attenuation. Electronically Signed   By: Andrea Gasman M.D.   On: 02/02/2025 12:04   IR NON-TUNNELED CENTRAL VENOUS CATH [facility] W IMG Result Date: 02/01/2025 INDICATION: Limited venous access in a patient requiring hemodialysis secondary to end-stage renal disease. The patient has 1 upper extremity venous access and is catheter dependent with a tunneled external jugular vein hemodialysis catheter as her only access. Previously failed AV fistulous. The patient adamantly refuses femoral fistulas or femoral tunneled lines. A current indwelling catheter appears to be infected with pus in the subcutaneous tract. Several discussions have taken place with the patient who continues to  refuse femoral access. The plan will be to remove the tunneled hemodialysis catheter while preserving left external jugular vein access with a temporary line and replacing a new tunneled catheter at a later date using the non tunneled access for conversion. EXAM: Tunneled to temporary hemodialysis exchange. ANESTHESIA/SEDATION: Moderate (conscious) sedation was employed during this procedure. A total of Versed  2 mg and Fentanyl  100 mcg was administered intravenously by the radiology nurse. Total intra-service moderate Sedation Time: 43 minutes. The patient's level of consciousness and vital signs were monitored continuously by radiology nursing throughout the procedure under my direct supervision. FLUOROSCOPY: Radiation Exposure Index (as provided by the fluoroscopic device): 3 mGy Kerma COMPLICATIONS: None immediate. PROCEDURE: Informed written consent was obtained from the patient after a thorough discussion of the procedural risks, benefits and alternatives. All questions were addressed. Maximal Sterile Barrier Technique was utilized including caps, mask, sterile gowns, sterile gloves, sterile drape, hand hygiene and skin antiseptic. A timeout was performed prior to the initiation of the procedure. In a supine position the left chest and catheter were prepped and draped in usual sterile fashion. Local anesthesia was achieved with 1% lidocaine . Evaluation of the catheter demonstrated mucous/pus in copious amounts at the subcutaneous tract. A small incision was made at the access site to the external jugular vein on the left side after using ultrasound to visualize the access point to the vein. The catheter was exposed, clamped, and cut at this location. The catheter cuff was then freed from the subcutaneous tract and the distal portion of the tunneled catheter removed from the patient. The tunneled hemodialysis catheter remnant was then partially retracted into guidewires were then advanced under fluoroscopic  guidance into the catheter. The catheter was then completely removed from the patient. A 24 cm Trialysis catheter was then advanced over the guidewire under fluoroscopic guidance until the catheter tip was identified at the cavoatrial junction. Guidewire removed. The catheter was tested for function and found to be functioning well. The catheter was then capped and flushed as per protocol. Retention suture applied. A second pursestring suture was then applied at the cutdown site overlying the left external jugular vein. Sterile dressing was applied. IMPRESSION: Removal of the tunneled hemodialysis catheter which demonstrated fossa in the tract. The catheter tip was cut and sent to laboratory for culture and sensitivity. A new non tunneled Trialysis catheter was placed in the last venous access (left external jugular vein) this patient has. The patient refuses femoral access. A pursestring suture placed at the access site secondary to a small cut down for tunneled line removal. Plan for removal of the pursestring suture in 1-2 days. Plan for exchange/conversion of  non tunneled hemodialysis catheter for a tunneled hemodialysis catheter maintaining left external jugular vein access for this patient prior to discharge. Electronically Signed   By: Cordella Banner   On: 02/01/2025 16:09   DG Chest Port 1 View Result Date: 01/27/2025 EXAM: 1 VIEW(S) XRAY OF THE CHEST 01/27/2025 04:47:00 AM COMPARISON: 01/26/2025 CLINICAL HISTORY: Sepsis. FINDINGS: LINES, TUBES AND DEVICES: Stable left chest tunneled dialysis catheter. LUNGS AND PLEURA: No focal pulmonary opacity. No pleural effusion. No pneumothorax. HEART AND MEDIASTINUM: No acute abnormality of the cardiac and mediastinal silhouettes. BONES AND SOFT TISSUES: No acute osseous abnormality. IMPRESSION: 1. No acute cardiopulmonary findings. 2. Stable left chest tunneled dialysis catheter. Electronically signed by: Evalene Coho MD 01/27/2025 05:16 AM EST RP  Workstation: [id number]   DG Chest Portable 1 View Result Date: 01/26/2025 CLINICAL DATA:  Sepsis. EXAM: PORTABLE CHEST 1 VIEW COMPARISON:  05/19/2024 been CT chest 01/26/2025. FINDINGS: Trachea is midline. Heart size stable. Left IJ dialysis catheter tips are in the SVC and right atrium. Mild basilar streaky opacification. No pleural fluid. IMPRESSION: Minimal bibasilar subsegmental atelectasis. Electronically Signed   By: Newell Eke M.D.   On: 01/26/2025 11:34   CT Angio Chest PE W and/or Wo Contrast Result Date: 01/26/2025 CLINICAL DATA:  history of DVT. Hypotension and tachy. Likely sepsis. Eval for source and PE; sepsis. Diarrhea. Pain in llq. Eval for diverticulitis EXAM: CT ANGIOGRAPHY CHEST CT ABDOMEN AND PELVIS WITH CONTRAST TECHNIQUE: Multidetector CT imaging of the chest was performed using the standard protocol during bolus administration of intravenous contrast. Multiplanar CT image reconstructions and MIPs were obtained to evaluate the vascular anatomy. Multidetector CT imaging of the abdomen and pelvis was performed using the standard protocol during bolus administration of intravenous contrast. RADIATION DOSE REDUCTION: This exam was performed according to the departmental dose-optimization program which includes automated exposure control, adjustment of the mA and/or kV according to patient size and/or use of iterative reconstruction technique. CONTRAST:  75mL OMNIPAQUE  IOHEXOL  350 MG/ML SOLN COMPARISON:  Chest CT 05/19/2024 and 05/09/2024. Abdominopelvic CT 12/01/2023. FINDINGS: CTA CHEST FINDINGS Cardiovascular: The pulmonary arteries are well opacified with contrast to the level of the segmental branches. There is no evidence of acute pulmonary embolism. There is central enlargement of the pulmonary arteries. Diffuse atherosclerosis of the aorta, great vessels and coronary arteries with calcifications of the aortic valve. The heart size is normal. There is no pericardial effusion.  Mediastinum/Nodes: There are no enlarged mediastinal, hilar or axillary lymph nodes. The thyroid  gland, trachea and esophagus demonstrate no significant findings. Lungs/Pleura: No pleural effusion or pneumothorax. Mild centrilobular emphysema and central airway thickening. Interval improvement in previously demonstrated linear right lung atelectasis. There is a new part solid nodule inferiorly in the right lower lobe, measuring 6 x 6 mm on image 57/12. 3 mm left upper lobe nodule on image 37/12 appears new. No confluent airspace disease. Musculoskeletal/Chest wall: No chest wall mass or suspicious osseous findings. Multilevel spondylosis. CT ABDOMEN AND PELVIS FINDINGS Hepatobiliary: The liver is normal in density without suspicious focal abnormality. No significant biliary dilatation status post cholecystectomy. Pancreas: Unremarkable. No pancreatic ductal dilatation or surrounding inflammatory changes. Spleen: Stable splenic lobularity and scarring. No focal abnormality. Adrenals/Urinary Tract: Both adrenal glands appear normal. No evidence of urinary tract calculus, hydronephrosis or perinephric soft tissue stranding. The kidneys appear unchanged with cortical thinning and simple appearing cysts bilaterally for which no specific follow-up imaging is recommended. The urinary bladder is nearly empty and suboptimally evaluated. Stomach/Bowel: No enteric contrast administered.  Stable small hiatal hernia. The stomach otherwise appears unremarkable for its degree of distention. Stable postsurgical changes from subtotal colectomy with ileorectal anastomosis. The rectum is fluid-filled without significant distension, wall thickening or surrounding inflammation. Vascular/Lymphatic: There are no enlarged abdominal or pelvic lymph nodes. Aortic and branch vessel atherosclerosis without evidence of aneurysm or large vessel occlusion. Reproductive: Status post hysterectomy. No evidence of adnexal mass. Other: Postsurgical  changes in the anterior abdominal wall with stable small umbilical hernia containing only fat. No ascites or pneumoperitoneum. Musculoskeletal: No acute or significant osseous findings. Review of the MIP images confirms the above findings. IMPRESSION: 1. No evidence of acute pulmonary embolism or other acute vascular findings in the chest. 2. New part solid 6 mm nodule inferiorly in the right lower lobe and new 3 mm left upper lobe nodule, indeterminate. Follow-up non-contrast CT recommended at 3-6 months to confirm persistence. If unchanged, and solid component remains <6 mm, annual CT is recommended until 5 years of stability has been established. If persistent these nodules should be considered highly suspicious if the solid component of the nodule is 6 mm or greater in size and enlarging. This recommendation follows the consensus statement: Guidelines for Management of Incidental Pulmonary Nodules Detected on CT Images: From the Fleischner Society 2017; Radiology 2017; 284:228-243. 3. No acute findings in the abdomen or pelvis. 4. Stable postsurgical changes from subtotal colectomy with ileorectal anastomosis. The rectum is fluid-filled without significant distension, wall thickening or surrounding inflammation. 5. Aortic Atherosclerosis (ICD10-I70.0) and Emphysema (ICD10-J43.9). Electronically Signed   By: Elsie Perone M.D.   On: 01/26/2025 10:48   CT ABDOMEN PELVIS W CONTRAST Result Date: 01/26/2025 CLINICAL DATA:  history of DVT. Hypotension and tachy. Likely sepsis. Eval for source and PE; sepsis. Diarrhea. Pain in llq. Eval for diverticulitis EXAM: CT ANGIOGRAPHY CHEST CT ABDOMEN AND PELVIS WITH CONTRAST TECHNIQUE: Multidetector CT imaging of the chest was performed using the standard protocol during bolus administration of intravenous contrast. Multiplanar CT image reconstructions and MIPs were obtained to evaluate the vascular anatomy. Multidetector CT imaging of the abdomen and pelvis was performed  using the standard protocol during bolus administration of intravenous contrast. RADIATION DOSE REDUCTION: This exam was performed according to the departmental dose-optimization program which includes automated exposure control, adjustment of the mA and/or kV according to patient size and/or use of iterative reconstruction technique. CONTRAST:  75mL OMNIPAQUE  IOHEXOL  350 MG/ML SOLN COMPARISON:  Chest CT 05/19/2024 and 05/09/2024. Abdominopelvic CT 12/01/2023. FINDINGS: CTA CHEST FINDINGS Cardiovascular: The pulmonary arteries are well opacified with contrast to the level of the segmental branches. There is no evidence of acute pulmonary embolism. There is central enlargement of the pulmonary arteries. Diffuse atherosclerosis of the aorta, great vessels and coronary arteries with calcifications of the aortic valve. The heart size is normal. There is no pericardial effusion. Mediastinum/Nodes: There are no enlarged mediastinal, hilar or axillary lymph nodes. The thyroid  gland, trachea and esophagus demonstrate no significant findings. Lungs/Pleura: No pleural effusion or pneumothorax. Mild centrilobular emphysema and central airway thickening. Interval improvement in previously demonstrated linear right lung atelectasis. There is a new part solid nodule inferiorly in the right lower lobe, measuring 6 x 6 mm on image 57/12. 3 mm left upper lobe nodule on image 37/12 appears new. No confluent airspace disease. Musculoskeletal/Chest wall: No chest wall mass or suspicious osseous findings. Multilevel spondylosis. CT ABDOMEN AND PELVIS FINDINGS Hepatobiliary: The liver is normal in density without suspicious focal abnormality. No significant biliary dilatation status post cholecystectomy. Pancreas:  Unremarkable. No pancreatic ductal dilatation or surrounding inflammatory changes. Spleen: Stable splenic lobularity and scarring. No focal abnormality. Adrenals/Urinary Tract: Both adrenal glands appear normal. No evidence of  urinary tract calculus, hydronephrosis or perinephric soft tissue stranding. The kidneys appear unchanged with cortical thinning and simple appearing cysts bilaterally for which no specific follow-up imaging is recommended. The urinary bladder is nearly empty and suboptimally evaluated. Stomach/Bowel: No enteric contrast administered. Stable small hiatal hernia. The stomach otherwise appears unremarkable for its degree of distention. Stable postsurgical changes from subtotal colectomy with ileorectal anastomosis. The rectum is fluid-filled without significant distension, wall thickening or surrounding inflammation. Vascular/Lymphatic: There are no enlarged abdominal or pelvic lymph nodes. Aortic and branch vessel atherosclerosis without evidence of aneurysm or large vessel occlusion. Reproductive: Status post hysterectomy. No evidence of adnexal mass. Other: Postsurgical changes in the anterior abdominal wall with stable small umbilical hernia containing only fat. No ascites or pneumoperitoneum. Musculoskeletal: No acute or significant osseous findings. Review of the MIP images confirms the above findings. IMPRESSION: 1. No evidence of acute pulmonary embolism or other acute vascular findings in the chest. 2. New part solid 6 mm nodule inferiorly in the right lower lobe and new 3 mm left upper lobe nodule, indeterminate. Follow-up non-contrast CT recommended at 3-6 months to confirm persistence. If unchanged, and solid component remains <6 mm, annual CT is recommended until 5 years of stability has been established. If persistent these nodules should be considered highly suspicious if the solid component of the nodule is 6 mm or greater in size and enlarging. This recommendation follows the consensus statement: Guidelines for Management of Incidental Pulmonary Nodules Detected on CT Images: From the Fleischner Society 2017; Radiology 2017; 284:228-243. 3. No acute findings in the abdomen or pelvis. 4. Stable  postsurgical changes from subtotal colectomy with ileorectal anastomosis. The rectum is fluid-filled without significant distension, wall thickening or surrounding inflammation. 5. Aortic Atherosclerosis (ICD10-I70.0) and Emphysema (ICD10-J43.9). Electronically Signed   By: Elsie Perone M.D.   On: 01/26/2025 10:48    Labs:  CBC: Recent Labs    01/29/25 0456 02/02/25 0846 02/03/25 0310 02/04/25 0339  WBC 10.1 10.5 10.1 12.3*  HGB 13.3 13.3 11.7* 12.0  HCT 40.1 42.4 37.6 37.7  PLT 149* 171 161 170    COAGS: No results for input(s): INR, APTT in the last 8760 hours.  BMP: Recent Labs    01/29/25 0456 02/02/25 0846 02/03/25 0310 02/04/25 0339  NA 132* 132* 134* 134*  K 4.3 5.6* 5.6* 6.1*  CL 94* 97* 101 100  CO2 22 21* 20* 18*  GLUCOSE 99 139* 108* 107*  BUN 30* 40* 39* 52*  CALCIUM  8.8* 9.3 8.9 8.5*  CREATININE 8.56* 10.20* 10.30* 12.30*  GFRNONAA 5* 4* 4* 3*    LIVER FUNCTION TESTS: Recent Labs    05/12/24 0646 05/19/24 1019 06/22/24 1222 01/26/25 1436 01/29/25 0456 02/02/25 0846 02/03/25 0310 02/04/25 0339  BILITOT 0.7 0.6 0.4 1.0  --   --   --   --   AST 25 17 15  32  --   --   --   --   ALT 27 16 11 26   --   --   --   --   ALKPHOS 109 89 127* 130*  --   --   --   --   PROT 7.8 6.5 7.4 7.8  --   --   --   --   ALBUMIN  3.4* 2.9* 3.8 3.7 3.4* 3.5 3.1* 3.1*  TUMOR MARKERS: No results for input(s): AFPTM, CEA, CA199, CHROMGRNA in the last 8760 hours.  Assessment and Plan:  Scheduled today for IR procedure - conversion of non tunn dialysis catheter to tunneled dialysis catheter with anesthesia Risks and benefits discussed with the patient including, but not limited to bleeding, infection, vascular injury, pneumothorax which may require chest tube placement, air embolism or even death  All of the patient's questions were answered, patient is agreeable to proceed. Consent signed and in chart.  Thank you for this interesting consult.  I  greatly enjoyed meeting Whitney Walker and look forward to participating in their care.  A copy of this report was sent to the requesting provider on this date.  Electronically Signed: Sharlet DELENA Candle, PA-C 02/04/2025, 9:26 AM   I spent a total of 20 Minutes    in face to face in clinical consultation, greater than 50% of which was counseling/coordinating care for non tunn to tunneled dialysis catheter "

## 2025-02-04 NOTE — TOC Progression Note (Signed)
 Transition of Care [facility]) - Progression Note    Patient Details  Name: Whitney Walker MRN: [id number] Date of Birth: 1956-09-14  [facility] CM/SW Contact  Landry DELENA Senters, RN Phone Number: 02/04/2025, 6:25 PM  Clinical Narrative:     Plan for tunneled HD placement today.   CM will continue to follow.   Expected Discharge Plan:  (TBD) Barriers to Discharge: Continued Medical Work up               Expected Discharge Plan and Services       Living arrangements for the past 2 months: Single Family Home                                       Social Drivers of Health (SDOH) Interventions SDOH Screenings   Food Insecurity: No Food Insecurity (01/27/2025)  Housing: Low Risk (01/27/2025)  Transportation Needs: No Transportation Needs (01/27/2025)  Utilities: Not At Risk (01/27/2025)  Alcohol Screen: Low Risk (03/30/2024)  Depression (PHQ2-9): Low Risk (06/22/2024)  Financial Resource Strain: Low Risk (03/30/2024)  Physical Activity: Insufficiently Active (05/06/2024)  Social Connections: Moderately Isolated (01/27/2025)  Stress: No Stress Concern Present (03/30/2024)  Tobacco Use: High Risk (02/04/2025)  Health Literacy: Adequate Health Literacy (05/06/2024)    Readmission Risk Interventions    05/20/2024    2:36 PM 05/21/2022    4:20 PM  Readmission Risk Prevention Plan  Transportation Screening Complete Complete  PCP or Specialist Appt within 3-5 Days Complete   HRI or Home Care Consult Complete   Social Work Consult for Recovery Care Planning/Counseling Complete   Palliative Care Screening Not Applicable   Medication Review Oceanographer) Referral to Pharmacy Referral to Pharmacy  HRI or Home Care Consult  Complete  SW Recovery Care/Counseling Consult  Complete  Palliative Care Screening  Not Applicable  Skilled Nursing Facility  Not Applicable

## 2025-02-04 NOTE — Procedures (Signed)
 Interventional Radiology Procedure Note  Procedure: Conversion of temp to tunneled HD line  Complications: None  Estimated Blood Loss: < 10 mL  Findings: Left EJ preserved for HD access by converting temp to tunneled hemodialysis catheter on the left side.  Cordella DELENA Banner, MD

## 2025-02-04 NOTE — Progress Notes (Signed)
 [facility] Progress Note Patient: Whitney Walker FMW:[id number] DOB: [birth year]  DOA: 01/26/2025 DOS: the patient was seen and examined on 02/04/2025  Brief Summary: Patient is a 69 y.o.  female with history of ESRD on HD MWF, chronic hypotension on midodrine , VTE on Eliquis -presented to the ED on 1/28 from her dialysis center for hypotension-unknown responsive to IVF-started on pressors-concern for sepsis of unknown etiology-subsequently admitted to ICU.  Stabilized and transferred to [facility] on 1/30.  Upon further evaluation-found to have a infected pocket/tunneled dialysis catheter site.   Significant events: 1/28>> admit to [facility] 1/30>> transferred to [facility]-pus expressed from tunneled HD catheter site-after discussion with renal-IR consulted.  Tentative plans to lateralize HD catheter due to concern of pocket infection.  Lateralization scheduled for 2/2 after Eliquis  washout. 2/02>> found to have pus/mucus-at tunneled HD catheter site-lateralization not possible due to infected tunneled-temporary dialysis catheter placed.  Patient not keen on having femoral tunneled catheter-has has had prior accesses on the right side.    Significant studies: 1/28>> CTA chest: No PE-no PNA 1/28>> CT abdomen/pelvis: Stable postsurgical changes-subtotal colectomy with ileorectal anastomosis.   Significant microbiology data: 1/28>> COVID/influenza/RSV PCR: Negative 1/28>> blood culture: No growth 2/02>> HD cath tip: No growth.   Procedures: 2/02>> fluoro-guided conversion to temporary left IJ HD catheter. 2/6 TDC placement under general anesthesia.   Consults: PCCM Nephrology IR   Assessment and plan: Septic shock-2/2 Tunneled catheter site/pocket infection Sepsis physiology has resolved-briefly required Levophed  while in the ICU Blood cultures are negative so far Did have some purulence from catheter site on 1/30-but that has since resolved for the past several days-unfortunately-TDC was not able  to be lateralized on 2/2-infected [facility] was removed and a  temporary dialysis catheter was placed. Patient is not keen on having femoral TDC Permanent HD catheter placed on anesthesia on 2/6. Likely home tomorrow after successful HD.  ESRD on HD MWF Hyperkalemia. Nephrology managing with HD. Undergoing HD today on 2/6. Likely home after that.   Chronic hypotension Midodrine -BP has stabilized.   Hyponatremia Improved with further HD.   History of VTE Eliquis  held for lateralization of [facility] will resume tomorrow.   History of PAD Stable-chronic issue Outpatient follow-up with vascular surgery   HLD Continue statin/Zetia    COPD Stable Bronchodilators   OSA Intolerant to CPAP prior notes   6 mm nodule RLL/3 mm left upper lobe nodule Repeat CT chest in 3 to 6 months.   Class 2 Obesity: Body mass index is 37.1 kg/m.  Placing the patient at a high risk for poor outcome.    Code Status: Full Code   DVT Prophylaxis: heparin  injection 5,000 Units Start: 01/29/25 0745 SCDs Start: 01/26/25 1242   Data review I have Reviewed nursing notes, Vitals, and Lab results. Since last encounter, pertinent lab results CBC and BMP   . I have ordered test including CBC and BMP  . I have discussed pt's care plan and test results with IR, nephrology  .   Physical exam. Vitals:   02/04/25 1553 02/04/25 1610 02/04/25 1630 02/04/25 1700  BP: (!) 146/64 125/60 129/80 110/61  Pulse: 98 93 95 (!) 114  Resp: 14 12 15 18   Temp: (!) 97.5 F (36.4 C)     TempSrc:      SpO2: 100% 100% 100% 98%  Weight: 92 kg     Height:      Clear to auscultation. Shortness of breath Bowel sound present Appearance acute distress. Trace lower extremity edema.  Subjective: Reports  pain.  No nausea or vomiting at the time of my evaluation but reported nausea during the last few minutes ago.  No diarrhea constipation  Family Communication: No one at bedside.  Disposition Plan: Status is:  Inpatient Remains inpatient appropriate because: Monitor for HD and correction of hyperkalemia.   Planned Discharge Destination:Home Diet: Diet Order             Diet renal with fluid restriction Fluid restriction: 1200 mL Fluid; Room service appropriate? Yes; Fluid consistency: Thin  Diet effective now                   MEDICATIONS: Scheduled Meds:  vitamin C   250 mg Oral Daily   atorvastatin   20 mg Oral Q24H   Chlorhexidine  Gluconate Cloth  6 each Topical Q0600   doxercalciferol   4 mcg Oral Once per day on Monday Wednesday Friday   ezetimibe   10 mg Oral Q24H   heparin  injection (subcutaneous)  5,000 Units Subcutaneous Q8H   midodrine   5 mg Oral TID WC   nicotine   21 mg Transdermal Q24H   pantoprazole   40 mg Oral Daily   sevelamer  carbonate  2,400 mg Oral TID WC   sodium zirconium cyclosilicate   10 g Oral Daily   sodium zirconium cyclosilicate   10 g Oral STAT   vitamin E   200 Units Oral Daily   Continuous Infusions:  vancomycin  1,000 mg (02/04/25 1349)   PRN Meds:.acetaminophen , albuterol , alteplase , heparin , iohexol , lidocaine  (PF), lidocaine -prilocaine , meclizine , ondansetron  (ZOFRAN ) IV, mouth rinse, oxyCODONE , pentafluoroprop-tetrafluoroeth, traZODone   Author: Yetta Blanch, MD  [facility] 02/04/2025  5:18 PM Between 7PM-7AM, please contact night-coverage, check [email or web address] for on call.

## 2025-02-04 NOTE — Transfer of Care (Signed)
 Immediate Anesthesia Transfer of Care Note  Patient: Whitney Walker  Procedure(s) Performed: RADIOLOGY WITH ANESTHESIA  Patient Location: PACU  Anesthesia Type:General  Level of Consciousness: awake, oriented, patient cooperative, and responds to stimulation  Airway & Oxygen  Therapy: Patient Spontanous Breathing and Patient connected to nasal cannula oxygen   Post-op Assessment: Report given to RN and Post -op Vital signs reviewed and stable  Post vital signs: Reviewed and stable  Last Vitals:  Vitals Value Taken Time  BP 131/84 1125  Temp    Pulse 104   Resp 20   SpO2 92-94     Last Pain:  Vitals:   02/04/25 0945  TempSrc: Oral  PainSc: 0-No pain      Patients Stated Pain Goal: 0 (02/04/25 0255)  Complications: No notable events documented.

## 2025-06-23 ENCOUNTER — Other Ambulatory Visit

## 2025-06-23 ENCOUNTER — Ambulatory Visit: Admitting: Hematology
